# Patient Record
Sex: Male | Born: 1970
Health system: Southern US, Community
[De-identification: ages and names within clinical notes are randomized; demographics above are authoritative.]

## PROBLEM LIST (undated history)

## (undated) DIAGNOSIS — N186 End stage renal disease: Secondary | ICD-10-CM

## (undated) DIAGNOSIS — E119 Type 2 diabetes mellitus without complications: Secondary | ICD-10-CM

## (undated) DIAGNOSIS — E7800 Pure hypercholesterolemia, unspecified: Secondary | ICD-10-CM

## (undated) DIAGNOSIS — A4901 Methicillin susceptible Staphylococcus aureus infection, unspecified site: Secondary | ICD-10-CM

## (undated) DIAGNOSIS — E559 Vitamin D deficiency, unspecified: Secondary | ICD-10-CM

## (undated) DIAGNOSIS — D649 Anemia, unspecified: Secondary | ICD-10-CM

## (undated) DIAGNOSIS — E669 Obesity, unspecified: Secondary | ICD-10-CM

## (undated) DIAGNOSIS — R809 Proteinuria, unspecified: Secondary | ICD-10-CM

## (undated) DIAGNOSIS — J9690 Respiratory failure, unspecified, unspecified whether with hypoxia or hypercapnia: Secondary | ICD-10-CM

## (undated) DIAGNOSIS — Z87898 Personal history of other specified conditions: Secondary | ICD-10-CM

## (undated) DIAGNOSIS — A419 Sepsis, unspecified organism: Secondary | ICD-10-CM

## (undated) DIAGNOSIS — I890 Lymphedema, not elsewhere classified: Secondary | ICD-10-CM

## (undated) DIAGNOSIS — L97909 Non-pressure chronic ulcer of unspecified part of unspecified lower leg with unspecified severity: Secondary | ICD-10-CM

## (undated) DIAGNOSIS — Z794 Long term (current) use of insulin: Secondary | ICD-10-CM

## (undated) DIAGNOSIS — G4730 Sleep apnea, unspecified: Secondary | ICD-10-CM

## (undated) DIAGNOSIS — I739 Peripheral vascular disease, unspecified: Secondary | ICD-10-CM

## (undated) DIAGNOSIS — E875 Hyperkalemia: Secondary | ICD-10-CM

## (undated) DIAGNOSIS — N189 Chronic kidney disease, unspecified: Secondary | ICD-10-CM

## (undated) DIAGNOSIS — R0600 Dyspnea, unspecified: Secondary | ICD-10-CM

## (undated) DIAGNOSIS — E11319 Type 2 diabetes mellitus with unspecified diabetic retinopathy without macular edema: Secondary | ICD-10-CM

## (undated) DIAGNOSIS — I10 Essential (primary) hypertension: Secondary | ICD-10-CM

## (undated) DIAGNOSIS — N390 Urinary tract infection, site not specified: Secondary | ICD-10-CM

## (undated) DIAGNOSIS — I83009 Varicose veins of unspecified lower extremity with ulcer of unspecified site: Secondary | ICD-10-CM

## (undated) DIAGNOSIS — F419 Anxiety disorder, unspecified: Secondary | ICD-10-CM

## (undated) DIAGNOSIS — I499 Cardiac arrhythmia, unspecified: Secondary | ICD-10-CM

## (undated) DIAGNOSIS — G9341 Metabolic encephalopathy: Secondary | ICD-10-CM

## (undated) DIAGNOSIS — F329 Major depressive disorder, single episode, unspecified: Secondary | ICD-10-CM

## (undated) HISTORY — DX: Type 2 diabetes mellitus without complications: E11.9

## (undated) HISTORY — DX: Vitamin D deficiency, unspecified: E55.9

## (undated) HISTORY — DX: Proteinuria, unspecified: R80.9

## (undated) HISTORY — DX: Essential (primary) hypertension: I10

## (undated) SURGERY — Surgical Case
Anesthesia: *Unknown

---

## 2003-08-31 DIAGNOSIS — E1169 Type 2 diabetes mellitus with other specified complication: Secondary | ICD-10-CM | POA: Insufficient documentation

## 2004-06-09 ENCOUNTER — Ambulatory Visit: Payer: Self-pay | Admitting: Internal Medicine

## 2012-06-12 ENCOUNTER — Encounter (INDEPENDENT_AMBULATORY_CARE_PROVIDER_SITE_OTHER): Payer: BC Managed Care – PPO | Admitting: Ophthalmology

## 2012-06-12 DIAGNOSIS — I10 Essential (primary) hypertension: Secondary | ICD-10-CM

## 2012-06-12 DIAGNOSIS — H3581 Retinal edema: Secondary | ICD-10-CM

## 2012-06-12 DIAGNOSIS — E1165 Type 2 diabetes mellitus with hyperglycemia: Secondary | ICD-10-CM

## 2012-06-12 DIAGNOSIS — H43819 Vitreous degeneration, unspecified eye: Secondary | ICD-10-CM

## 2012-06-12 DIAGNOSIS — E1139 Type 2 diabetes mellitus with other diabetic ophthalmic complication: Secondary | ICD-10-CM

## 2012-06-12 DIAGNOSIS — H2510 Age-related nuclear cataract, unspecified eye: Secondary | ICD-10-CM

## 2012-06-12 DIAGNOSIS — D3130 Benign neoplasm of unspecified choroid: Secondary | ICD-10-CM

## 2012-06-12 DIAGNOSIS — E11319 Type 2 diabetes mellitus with unspecified diabetic retinopathy without macular edema: Secondary | ICD-10-CM

## 2012-06-12 DIAGNOSIS — H35039 Hypertensive retinopathy, unspecified eye: Secondary | ICD-10-CM

## 2012-06-26 ENCOUNTER — Ambulatory Visit (INDEPENDENT_AMBULATORY_CARE_PROVIDER_SITE_OTHER): Payer: BC Managed Care – PPO | Admitting: Ophthalmology

## 2012-06-26 DIAGNOSIS — E1165 Type 2 diabetes mellitus with hyperglycemia: Secondary | ICD-10-CM

## 2012-06-26 DIAGNOSIS — H3581 Retinal edema: Secondary | ICD-10-CM

## 2012-07-10 ENCOUNTER — Other Ambulatory Visit (INDEPENDENT_AMBULATORY_CARE_PROVIDER_SITE_OTHER): Payer: BC Managed Care – PPO | Admitting: Ophthalmology

## 2012-07-10 DIAGNOSIS — H3581 Retinal edema: Secondary | ICD-10-CM

## 2012-07-10 DIAGNOSIS — E1139 Type 2 diabetes mellitus with other diabetic ophthalmic complication: Secondary | ICD-10-CM

## 2012-08-07 ENCOUNTER — Encounter (INDEPENDENT_AMBULATORY_CARE_PROVIDER_SITE_OTHER): Payer: BC Managed Care – PPO | Admitting: Ophthalmology

## 2012-08-09 ENCOUNTER — Encounter (INDEPENDENT_AMBULATORY_CARE_PROVIDER_SITE_OTHER): Payer: BC Managed Care – PPO | Admitting: Ophthalmology

## 2012-09-04 ENCOUNTER — Encounter (INDEPENDENT_AMBULATORY_CARE_PROVIDER_SITE_OTHER): Payer: BC Managed Care – PPO | Admitting: Ophthalmology

## 2012-09-04 DIAGNOSIS — E1139 Type 2 diabetes mellitus with other diabetic ophthalmic complication: Secondary | ICD-10-CM

## 2012-09-04 DIAGNOSIS — E11359 Type 2 diabetes mellitus with proliferative diabetic retinopathy without macular edema: Secondary | ICD-10-CM

## 2013-01-08 ENCOUNTER — Ambulatory Visit (INDEPENDENT_AMBULATORY_CARE_PROVIDER_SITE_OTHER): Payer: BC Managed Care – PPO | Admitting: Ophthalmology

## 2013-01-08 DIAGNOSIS — E11359 Type 2 diabetes mellitus with proliferative diabetic retinopathy without macular edema: Secondary | ICD-10-CM

## 2013-01-08 DIAGNOSIS — I10 Essential (primary) hypertension: Secondary | ICD-10-CM

## 2013-01-08 DIAGNOSIS — H35039 Hypertensive retinopathy, unspecified eye: Secondary | ICD-10-CM

## 2013-01-08 DIAGNOSIS — H43819 Vitreous degeneration, unspecified eye: Secondary | ICD-10-CM

## 2013-01-08 DIAGNOSIS — E11319 Type 2 diabetes mellitus with unspecified diabetic retinopathy without macular edema: Secondary | ICD-10-CM

## 2013-01-08 DIAGNOSIS — E1139 Type 2 diabetes mellitus with other diabetic ophthalmic complication: Secondary | ICD-10-CM

## 2013-01-08 DIAGNOSIS — D3130 Benign neoplasm of unspecified choroid: Secondary | ICD-10-CM

## 2013-01-08 DIAGNOSIS — H2510 Age-related nuclear cataract, unspecified eye: Secondary | ICD-10-CM

## 2013-07-16 ENCOUNTER — Ambulatory Visit (INDEPENDENT_AMBULATORY_CARE_PROVIDER_SITE_OTHER): Payer: BC Managed Care – PPO | Admitting: Ophthalmology

## 2013-07-16 DIAGNOSIS — I10 Essential (primary) hypertension: Secondary | ICD-10-CM

## 2013-07-16 DIAGNOSIS — H35039 Hypertensive retinopathy, unspecified eye: Secondary | ICD-10-CM

## 2013-07-16 DIAGNOSIS — H43819 Vitreous degeneration, unspecified eye: Secondary | ICD-10-CM

## 2013-07-16 DIAGNOSIS — H2510 Age-related nuclear cataract, unspecified eye: Secondary | ICD-10-CM

## 2013-07-16 DIAGNOSIS — E11319 Type 2 diabetes mellitus with unspecified diabetic retinopathy without macular edema: Secondary | ICD-10-CM

## 2013-07-16 DIAGNOSIS — E11359 Type 2 diabetes mellitus with proliferative diabetic retinopathy without macular edema: Secondary | ICD-10-CM

## 2013-07-16 DIAGNOSIS — E11311 Type 2 diabetes mellitus with unspecified diabetic retinopathy with macular edema: Secondary | ICD-10-CM

## 2013-07-16 DIAGNOSIS — E1139 Type 2 diabetes mellitus with other diabetic ophthalmic complication: Secondary | ICD-10-CM

## 2013-07-25 ENCOUNTER — Encounter (INDEPENDENT_AMBULATORY_CARE_PROVIDER_SITE_OTHER): Payer: BC Managed Care – PPO | Admitting: Ophthalmology

## 2013-07-25 DIAGNOSIS — E11319 Type 2 diabetes mellitus with unspecified diabetic retinopathy without macular edema: Secondary | ICD-10-CM

## 2013-07-25 DIAGNOSIS — E11359 Type 2 diabetes mellitus with proliferative diabetic retinopathy without macular edema: Secondary | ICD-10-CM

## 2013-07-25 DIAGNOSIS — E11311 Type 2 diabetes mellitus with unspecified diabetic retinopathy with macular edema: Secondary | ICD-10-CM

## 2013-07-25 DIAGNOSIS — E1139 Type 2 diabetes mellitus with other diabetic ophthalmic complication: Secondary | ICD-10-CM

## 2013-08-20 ENCOUNTER — Encounter (INDEPENDENT_AMBULATORY_CARE_PROVIDER_SITE_OTHER): Payer: BC Managed Care – PPO | Admitting: Ophthalmology

## 2013-08-20 DIAGNOSIS — H43819 Vitreous degeneration, unspecified eye: Secondary | ICD-10-CM

## 2013-08-20 DIAGNOSIS — E11319 Type 2 diabetes mellitus with unspecified diabetic retinopathy without macular edema: Secondary | ICD-10-CM

## 2013-08-20 DIAGNOSIS — D3130 Benign neoplasm of unspecified choroid: Secondary | ICD-10-CM

## 2013-08-20 DIAGNOSIS — H2510 Age-related nuclear cataract, unspecified eye: Secondary | ICD-10-CM

## 2013-08-20 DIAGNOSIS — I10 Essential (primary) hypertension: Secondary | ICD-10-CM

## 2013-08-20 DIAGNOSIS — H35039 Hypertensive retinopathy, unspecified eye: Secondary | ICD-10-CM

## 2013-08-20 DIAGNOSIS — E11311 Type 2 diabetes mellitus with unspecified diabetic retinopathy with macular edema: Secondary | ICD-10-CM

## 2013-08-20 DIAGNOSIS — E1139 Type 2 diabetes mellitus with other diabetic ophthalmic complication: Secondary | ICD-10-CM

## 2013-09-03 ENCOUNTER — Encounter (INDEPENDENT_AMBULATORY_CARE_PROVIDER_SITE_OTHER): Payer: BC Managed Care – PPO | Admitting: Ophthalmology

## 2013-09-17 ENCOUNTER — Encounter (INDEPENDENT_AMBULATORY_CARE_PROVIDER_SITE_OTHER): Payer: 59 | Admitting: Ophthalmology

## 2013-09-17 DIAGNOSIS — D3130 Benign neoplasm of unspecified choroid: Secondary | ICD-10-CM

## 2013-09-17 DIAGNOSIS — E1139 Type 2 diabetes mellitus with other diabetic ophthalmic complication: Secondary | ICD-10-CM

## 2013-09-17 DIAGNOSIS — E11319 Type 2 diabetes mellitus with unspecified diabetic retinopathy without macular edema: Secondary | ICD-10-CM

## 2013-09-17 DIAGNOSIS — E11359 Type 2 diabetes mellitus with proliferative diabetic retinopathy without macular edema: Secondary | ICD-10-CM

## 2013-09-17 DIAGNOSIS — H2510 Age-related nuclear cataract, unspecified eye: Secondary | ICD-10-CM

## 2013-09-17 DIAGNOSIS — E11311 Type 2 diabetes mellitus with unspecified diabetic retinopathy with macular edema: Secondary | ICD-10-CM

## 2013-09-17 DIAGNOSIS — E1165 Type 2 diabetes mellitus with hyperglycemia: Secondary | ICD-10-CM

## 2013-09-17 DIAGNOSIS — I10 Essential (primary) hypertension: Secondary | ICD-10-CM

## 2013-09-17 DIAGNOSIS — H43819 Vitreous degeneration, unspecified eye: Secondary | ICD-10-CM

## 2013-09-17 DIAGNOSIS — H35039 Hypertensive retinopathy, unspecified eye: Secondary | ICD-10-CM

## 2013-10-15 ENCOUNTER — Encounter (INDEPENDENT_AMBULATORY_CARE_PROVIDER_SITE_OTHER): Payer: 59 | Admitting: Ophthalmology

## 2013-10-15 DIAGNOSIS — E11311 Type 2 diabetes mellitus with unspecified diabetic retinopathy with macular edema: Secondary | ICD-10-CM

## 2013-10-15 DIAGNOSIS — H35039 Hypertensive retinopathy, unspecified eye: Secondary | ICD-10-CM

## 2013-10-15 DIAGNOSIS — I10 Essential (primary) hypertension: Secondary | ICD-10-CM

## 2013-10-15 DIAGNOSIS — E1165 Type 2 diabetes mellitus with hyperglycemia: Secondary | ICD-10-CM

## 2013-10-15 DIAGNOSIS — E11359 Type 2 diabetes mellitus with proliferative diabetic retinopathy without macular edema: Secondary | ICD-10-CM

## 2013-10-15 DIAGNOSIS — E1139 Type 2 diabetes mellitus with other diabetic ophthalmic complication: Secondary | ICD-10-CM

## 2013-10-15 DIAGNOSIS — E11319 Type 2 diabetes mellitus with unspecified diabetic retinopathy without macular edema: Secondary | ICD-10-CM

## 2013-12-10 ENCOUNTER — Encounter (INDEPENDENT_AMBULATORY_CARE_PROVIDER_SITE_OTHER): Payer: 59 | Admitting: Ophthalmology

## 2013-12-13 ENCOUNTER — Encounter (INDEPENDENT_AMBULATORY_CARE_PROVIDER_SITE_OTHER): Payer: 59 | Admitting: Ophthalmology

## 2014-03-12 DIAGNOSIS — R809 Proteinuria, unspecified: Secondary | ICD-10-CM | POA: Insufficient documentation

## 2014-06-13 DIAGNOSIS — Z7901 Long term (current) use of anticoagulants: Secondary | ICD-10-CM | POA: Insufficient documentation

## 2014-06-13 DIAGNOSIS — Z794 Long term (current) use of insulin: Secondary | ICD-10-CM | POA: Insufficient documentation

## 2014-06-18 DIAGNOSIS — I10 Essential (primary) hypertension: Secondary | ICD-10-CM | POA: Insufficient documentation

## 2014-06-18 DIAGNOSIS — I739 Peripheral vascular disease, unspecified: Secondary | ICD-10-CM | POA: Insufficient documentation

## 2014-08-10 ENCOUNTER — Ambulatory Visit: Payer: Self-pay | Admitting: Physician Assistant

## 2016-10-14 ENCOUNTER — Encounter (INDEPENDENT_AMBULATORY_CARE_PROVIDER_SITE_OTHER): Payer: Self-pay

## 2017-08-30 DIAGNOSIS — N185 Chronic kidney disease, stage 5: Secondary | ICD-10-CM | POA: Insufficient documentation

## 2017-08-30 DIAGNOSIS — I120 Hypertensive chronic kidney disease with stage 5 chronic kidney disease or end stage renal disease: Secondary | ICD-10-CM | POA: Insufficient documentation

## 2017-09-15 ENCOUNTER — Telehealth (INDEPENDENT_AMBULATORY_CARE_PROVIDER_SITE_OTHER): Payer: Self-pay

## 2017-09-15 NOTE — Telephone Encounter (Signed)
Called the patient back and gave him the message.

## 2017-09-15 NOTE — Telephone Encounter (Signed)
Patient called and stated that his left leg is swelling form the knee down and it hurts to touch it. He states that the pain is in the calf area. He states that he has been trying to elevate his leg sometimes, but nothing is helping to block out the pain in his leg  He also states that left leg is much larger than the right leg. He was trying to hold off until his appointment with [place] 09/26/17, but he doesn't think that he can wait that long?

## 2017-09-15 NOTE — Telephone Encounter (Signed)
I would speak to the providers who will be in the office the next two days if it is ok to place him on their schedule.

## 2017-09-20 ENCOUNTER — Encounter (INDEPENDENT_AMBULATORY_CARE_PROVIDER_SITE_OTHER): Payer: Self-pay | Admitting: Vascular Surgery

## 2017-09-20 ENCOUNTER — Ambulatory Visit (INDEPENDENT_AMBULATORY_CARE_PROVIDER_SITE_OTHER): Payer: BLUE CROSS/BLUE SHIELD | Admitting: Vascular Surgery

## 2017-09-20 VITALS — BP 191/85 | HR 68 | Resp 19 | Ht 76.0 in | Wt 363.0 lb

## 2017-09-20 DIAGNOSIS — E119 Type 2 diabetes mellitus without complications: Secondary | ICD-10-CM

## 2017-09-20 DIAGNOSIS — E1165 Type 2 diabetes mellitus with hyperglycemia: Secondary | ICD-10-CM | POA: Insufficient documentation

## 2017-09-20 DIAGNOSIS — M7989 Other specified soft tissue disorders: Secondary | ICD-10-CM

## 2017-09-20 DIAGNOSIS — I10 Essential (primary) hypertension: Secondary | ICD-10-CM

## 2017-09-20 NOTE — Assessment & Plan Note (Signed)
I have had a long discussion with the patient regarding swelling and why it  causes symptoms.  Patient will begin with Unna boots to try to get the swelling under control as it is quite dramatic today and he has a large blister that is going to become an ulceration.  This will likely be required for 2-3 weeks.  After this, the patient will began wearing graduated compression stockings class 1 (20-30 mmHg) on a daily basis a prescription was given. The patient will  beginning wearing the stockings first thing in the morning and removing them in the evening. The patient is instructed specifically not to sleep in the stockings.   In addition, behavioral modification will be initiated.  This will include frequent elevation, use of over the counter pain medications and exercise such as walking.  I have reviewed systemic causes for chronic edema such as liver, kidney and cardiac etiologies.  The patient denies problems with these organ systems.    Consideration for a lymph pump will also be made based upon the effectiveness of conservative therapy.  This would help to improve the edema control and prevent sequela such as ulcers and infections   Patient should undergo duplex ultrasound of the venous system to ensure that DVT or reflux is not present.  The patient will follow-up with me after the ultrasound.

## 2017-09-20 NOTE — Patient Instructions (Signed)
Edema Edema is an abnormal buildup of fluids in your bodytissues. Edema is somewhatdependent on gravity to pull the fluid to the lowest place in your body. That makes the condition more common in the legs and thighs (lower extremities). Painless swelling of the feet and ankles is common and becomes more likely as you get older. It is also common in looser tissues, like around your eyes. When the affected area is squeezed, the fluid may move out of that spot and leave a dent for a few moments. This dent is called pitting. What are the causes? There are many possible causes of edema. Eating too much salt and being on your feet or sitting for a long time can cause edema in your legs and ankles. Hot weather may make edema worse. Common medical causes of edema include:  Heart failure.  Liver disease.  Kidney disease.  Weak blood vessels in your legs.  Cancer.  An injury.  Pregnancy.  Some medications.  Obesity.  What are the signs or symptoms? Edema is usually painless.Your skin may look swollen or shiny. How is this diagnosed? Your health care provider may be able to diagnose edema by asking about your medical history and doing a physical exam. You may need to have tests such as X-rays, an electrocardiogram, or blood tests to check for medical conditions that may cause edema. How is this treated? Edema treatment depends on the cause. If you have heart, liver, or kidney disease, you need the treatment appropriate for these conditions. General treatment may include:  Elevation of the affected body part above the level of your heart.  Compression of the affected body part. Pressure from elastic bandages or support stockings squeezes the tissues and forces fluid back into the blood vessels. This keeps fluid from entering the tissues.  Restriction of fluid and salt intake.  Use of a water pill (diuretic). These medications are appropriate only for some types of edema. They pull fluid  out of your body and make you urinate more often. This gets rid of fluid and reduces swelling, but diuretics can have side effects. Only use diuretics as directed by your health care provider.  Follow these instructions at home:  Keep the affected body part above the level of your heart when you are lying down.  Do not sit still or stand for prolonged periods.  Do not put anything directly under your knees when lying down.  Do not wear constricting clothing or garters on your upper legs.  Exercise your legs to work the fluid back into your blood vessels. This may help the swelling go down.  Wear elastic bandages or support stockings to reduce ankle swelling as directed by your health care provider.  Eat a low-salt diet to reduce fluid if your health care provider recommends it.  Only take medicines as directed by your health care provider. Contact a health care provider if:  Your edema is not responding to treatment.  You have heart, liver, or kidney disease and notice symptoms of edema.  You have edema in your legs that does not improve after elevating them.  You have sudden and unexplained weight gain. Get help right away if:  You develop shortness of breath or chest pain.  You cannot breathe when you lie down.  You develop pain, redness, or warmth in the swollen areas.  You have heart, liver, or kidney disease and suddenly get edema.  You have a fever and your symptoms suddenly get worse. This information is   not intended to replace advice given to you by your health care provider. Make sure you discuss any questions you have with your health care provider. Document Released: 08/16/2005 Document Revised: 01/22/2016 Document Reviewed: 06/08/2013 Elsevier Interactive Patient Education  2017 [employer]  

## 2017-09-20 NOTE — Assessment & Plan Note (Signed)
blood pressure control important in reducing the progression of atherosclerotic disease. On appropriate oral medications.  

## 2017-09-20 NOTE — Assessment & Plan Note (Signed)
blood glucose control important in reducing the progression of atherosclerotic disease. Also, involved in wound healing. On appropriate medications.  

## 2017-09-20 NOTE — Progress Notes (Signed)
Patient ID: [name], male   DOB: [birth year], 47 y.o.   MRN: [id number]  Chief Complaint  Patient presents with  . New Patient (Initial Visit)    LE edema, left    HPI [name] is a 47 y.o. male.  I am asked to see the patient by S. Gauger for evaluation of left leg swelling.  The patient reports years of progressive swelling in his left leg.  This has been an ongoing problem but has exacerbated and gotten much worse over the past several months.  He has been out of work since last week as the left leg swelling is gotten dramatic.  He has a large blister on the posterior calf that started within the past couple of weeks as well.  No fevers or chills.  No antecedent history of DVT or superficial thrombophlebitis to his knowledge.  No previous history of trauma or surgery to that leg.  No significant right lower extremity symptoms.  There is no clear inciting event or causative factor that started the symptoms.  He tried to get a compression stocking a few weeks ago, but his leg was too swollen and was told by the woman at the store he was off the charts.   Past Medical History:  Diagnosis Date  . Diabetes mellitus without complication ([place])   . Hypertension   . Microalbuminuria   . Vitamin D deficiency    PSH: None  Family History  Problem Relation Age of Onset  . Diabetes Father   . Kidney disease Father   . Diabetes Daughter   No bleeding or clotting disorders  Social History Social History   Tobacco Use  . Smoking status: Never Smoker  . Smokeless tobacco: Never Used  Substance Use Topics  . Alcohol use: Yes    Comment: beer  . Drug use: Not on file     Allergies  Allergen Reactions  . Lisinopril Swelling    Current Outpatient Medications  Medication Sig Dispense Refill  . amLODipine (NORVASC) 10 MG tablet Take by mouth.    [name] atenolol (TENORMIN) 25 MG tablet Take 25 mg by mouth daily.  1  . empagliflozin (JARDIANCE) 25 MG TABS tablet Take by  mouth.    [name] gentamicin cream (GARAMYCIN) 0.1 % Apply 1 application topically 3 (three) times daily.    . hydrochlorothiazide (HYDRODIURIL) 25 MG tablet Take by mouth.    . Insulin Glargine (LANTUS SOLOSTAR) 100 UNIT/ML Solostar Pen Inject into the skin.    . metFORMIN (GLUCOPHAGE-XR) 500 MG 24 hr tablet TAKE 2 TABLETS (1,000 MG TOTAL) BY MOUTH ONCE DAILY.(NEED APPT FOR FURTHER REFILLS)  0   No current facility-administered medications for this visit.       REVIEW OF SYSTEMS (Negative unless checked)  Constitutional: [] Weight loss  [] Fever  [] Chills Cardiac: [] Chest pain   [] Chest pressure   [] Palpitations   [] Shortness of breath when laying flat   [] Shortness of breath at rest   [] Shortness of breath with exertion. Vascular:  [] Pain in legs with walking   [] Pain in legs at rest   [] Pain in legs when laying flat   [] Claudication   [] Pain in feet when walking  [] Pain in feet at rest  [] Pain in feet when laying flat   [] History of DVT   [] Phlebitis   [x] Swelling in legs   [] Varicose veins   [] Non-healing ulcers Pulmonary:   [] Uses home oxygen   [] Productive cough   [] Hemoptysis   [] Wheeze  []   COPD   [] Asthma Neurologic:  [] Dizziness  [] Blackouts   [] Seizures   [] History of stroke   [] History of TIA  [] Aphasia   [] Temporary blindness   [] Dysphagia   [] Weakness or numbness in arms   [] Weakness or numbness in legs Musculoskeletal:  [x] Arthritis   [] Joint swelling   [] Joint pain   [] Low back pain Hematologic:  [] Easy bruising  [] Easy bleeding   [] Hypercoagulable state   [] Anemic  [] Hepatitis Gastrointestinal:  [] Blood in stool   [] Vomiting blood  [] Gastroesophageal reflux/heartburn   [] Abdominal pain Genitourinary:  [] Chronic kidney disease   [] Difficult urination  [] Frequent urination  [] Burning with urination   [] Hematuria Skin:  [] Rashes   [] Ulcers   [] Wounds Psychological:  [] History of anxiety   []  History of major depression.    Physical Exam BP (!) 191/85 (BP Location: Right Arm, Patient  Position: Sitting)   Pulse 68   Resp 19   Ht 6\' 4"  (1.93 m)   Wt (!) 164.7 kg (363 lb)   BMI 44.19 kg/m  Gen:  WD/WN, NAD Head: [place]/AT, No temporalis wasting.  Ear/Nose/Throat: Hearing grossly intact, nares w/o erythema or drainage, oropharynx w/o Erythema/Exudate Eyes: Conjunctiva clear, sclera non-icteric  Neck: trachea midline.  No JVD.  Pulmonary:  Good air movement, respirations not labored, no use of accessory muscles Cardiac: RRR, no JVD Vascular:  Vessel Right Left  Radial Palpable Palpable                          PT  1+ palpable  not palpable  DP  2+ palpable  not palpable   Gastrointestinal: soft, non-tender/non-distended. Musculoskeletal: M/S 5/5 throughout.  Extremities without ischemic changes.  No deformity or atrophy.  Trace right lower extremity edema, 3-4+ left lower extremity edema.  Stasis dermatitis and scaling changes to the skin of the left leg.  Large blister and impending ulceration on the left posterior calf measuring about 2-3 cm Neurologic: Sensation grossly intact in extremities.  Symmetrical.  Speech is fluent. Motor exam as listed above. Psychiatric: Judgment intact, Mood & affect appropriate for pt's clinical situation. Dermatologic: No open ulceration, but blister as above   Radiology No results found.  Labs No results found for this or any previous visit (from the past 2160 hour(s)).  Assessment/Plan:  Hypertension blood pressure control important in reducing the progression of atherosclerotic disease. On appropriate oral medications.   Diabetes mellitus without complication (HCC) blood glucose control important in reducing the progression of atherosclerotic disease. Also, involved in wound healing. On appropriate medications.   Swelling of limb I have had a long discussion with the patient regarding swelling and why it  causes symptoms.  Patient will begin with Unna boots to try to get the swelling under control as it is quite  dramatic today and he has a large blister that is going to become an ulceration.  This will likely be required for 2-3 weeks.  After this, the patient will began wearing graduated compression stockings class 1 (20-30 mmHg) on a daily basis a prescription was given. The patient will  beginning wearing the stockings first thing in the morning and removing them in the evening. The patient is instructed specifically not to sleep in the stockings.   In addition, behavioral modification will be initiated.  This will include frequent elevation, use of over the counter pain medications and exercise such as walking.  I have reviewed systemic causes for chronic edema such as liver, kidney and  cardiac etiologies.  The patient denies problems with these organ systems.    Consideration for a lymph pump will also be made based upon the effectiveness of conservative therapy.  This would help to improve the edema control and prevent sequela such as ulcers and infections   Patient should undergo duplex ultrasound of the venous system to ensure that DVT or reflux is not present.  The patient will follow-up with me after the ultrasound.        Leotis Pain 09/20/2017, 10:28 AM   This note was created with Dragon medical transcription system.  Any errors from dictation are unintentional.

## 2017-09-27 ENCOUNTER — Ambulatory Visit (INDEPENDENT_AMBULATORY_CARE_PROVIDER_SITE_OTHER): Payer: BLUE CROSS/BLUE SHIELD | Admitting: Vascular Surgery

## 2017-09-27 ENCOUNTER — Encounter (INDEPENDENT_AMBULATORY_CARE_PROVIDER_SITE_OTHER): Payer: Self-pay

## 2017-09-27 VITALS — BP 141/84 | HR 75 | Resp 18 | Ht 76.0 in | Wt 351.0 lb

## 2017-09-27 DIAGNOSIS — M7989 Other specified soft tissue disorders: Secondary | ICD-10-CM | POA: Diagnosis not present

## 2017-09-27 NOTE — Progress Notes (Signed)
History of Present Illness  There is no documented history at this time  Assessments & Plan   There are no diagnoses linked to this encounter.    Additional instructions  Subjective:  Patient presents with venous ulcer of the Left lower extremity.    Procedure:  3 layer unna wrap was placed Left lower extremity.   Plan:   Follow up in one week.  

## 2017-10-01 ENCOUNTER — Emergency Department: Payer: BLUE CROSS/BLUE SHIELD

## 2017-10-01 ENCOUNTER — Encounter: Payer: Self-pay | Admitting: Emergency Medicine

## 2017-10-01 ENCOUNTER — Inpatient Hospital Stay
Admission: EM | Admit: 2017-10-01 | Discharge: 2017-10-19 | DRG: 570 | Disposition: A | Discharge status: Skilled Nursing Facility | Payer: BLUE CROSS/BLUE SHIELD | Attending: Internal Medicine | Admitting: Internal Medicine

## 2017-10-01 ENCOUNTER — Inpatient Hospital Stay: Payer: BLUE CROSS/BLUE SHIELD

## 2017-10-01 ENCOUNTER — Other Ambulatory Visit: Payer: Self-pay

## 2017-10-01 DIAGNOSIS — N183 Chronic kidney disease, stage 3 unspecified: Secondary | ICD-10-CM

## 2017-10-01 DIAGNOSIS — L02416 Cutaneous abscess of left lower limb: Secondary | ICD-10-CM | POA: Diagnosis present

## 2017-10-01 DIAGNOSIS — L02415 Cutaneous abscess of right lower limb: Secondary | ICD-10-CM | POA: Diagnosis present

## 2017-10-01 DIAGNOSIS — Z841 Family history of disorders of kidney and ureter: Secondary | ICD-10-CM | POA: Diagnosis not present

## 2017-10-01 DIAGNOSIS — E1151 Type 2 diabetes mellitus with diabetic peripheral angiopathy without gangrene: Secondary | ICD-10-CM | POA: Diagnosis present

## 2017-10-01 DIAGNOSIS — J9811 Atelectasis: Secondary | ICD-10-CM | POA: Diagnosis not present

## 2017-10-01 DIAGNOSIS — L97929 Non-pressure chronic ulcer of unspecified part of left lower leg with unspecified severity: Secondary | ICD-10-CM | POA: Diagnosis present

## 2017-10-01 DIAGNOSIS — E11649 Type 2 diabetes mellitus with hypoglycemia without coma: Secondary | ICD-10-CM | POA: Diagnosis not present

## 2017-10-01 DIAGNOSIS — E1122 Type 2 diabetes mellitus with diabetic chronic kidney disease: Secondary | ICD-10-CM | POA: Diagnosis present

## 2017-10-01 DIAGNOSIS — R0902 Hypoxemia: Secondary | ICD-10-CM

## 2017-10-01 DIAGNOSIS — E11319 Type 2 diabetes mellitus with unspecified diabetic retinopathy without macular edema: Secondary | ICD-10-CM | POA: Diagnosis present

## 2017-10-01 DIAGNOSIS — Z794 Long term (current) use of insulin: Secondary | ICD-10-CM

## 2017-10-01 DIAGNOSIS — B9561 Methicillin susceptible Staphylococcus aureus infection as the cause of diseases classified elsewhere: Secondary | ICD-10-CM | POA: Diagnosis present

## 2017-10-01 DIAGNOSIS — I129 Hypertensive chronic kidney disease with stage 1 through stage 4 chronic kidney disease, or unspecified chronic kidney disease: Secondary | ICD-10-CM | POA: Diagnosis present

## 2017-10-01 DIAGNOSIS — L02413 Cutaneous abscess of right upper limb: Secondary | ICD-10-CM | POA: Diagnosis not present

## 2017-10-01 DIAGNOSIS — I872 Venous insufficiency (chronic) (peripheral): Secondary | ICD-10-CM

## 2017-10-01 DIAGNOSIS — D638 Anemia in other chronic diseases classified elsewhere: Secondary | ICD-10-CM | POA: Diagnosis present

## 2017-10-01 DIAGNOSIS — L0291 Cutaneous abscess, unspecified: Secondary | ICD-10-CM | POA: Diagnosis present

## 2017-10-01 DIAGNOSIS — E1121 Type 2 diabetes mellitus with diabetic nephropathy: Secondary | ICD-10-CM | POA: Diagnosis not present

## 2017-10-01 DIAGNOSIS — N179 Acute kidney failure, unspecified: Secondary | ICD-10-CM | POA: Diagnosis present

## 2017-10-01 DIAGNOSIS — Z79899 Other long term (current) drug therapy: Secondary | ICD-10-CM

## 2017-10-01 DIAGNOSIS — E1140 Type 2 diabetes mellitus with diabetic neuropathy, unspecified: Secondary | ICD-10-CM | POA: Diagnosis present

## 2017-10-01 DIAGNOSIS — A419 Sepsis, unspecified organism: Secondary | ICD-10-CM

## 2017-10-01 DIAGNOSIS — Z833 Family history of diabetes mellitus: Secondary | ICD-10-CM

## 2017-10-01 DIAGNOSIS — R739 Hyperglycemia, unspecified: Secondary | ICD-10-CM

## 2017-10-01 DIAGNOSIS — E11621 Type 2 diabetes mellitus with foot ulcer: Secondary | ICD-10-CM

## 2017-10-01 DIAGNOSIS — E1165 Type 2 diabetes mellitus with hyperglycemia: Secondary | ICD-10-CM | POA: Diagnosis present

## 2017-10-01 DIAGNOSIS — E119 Type 2 diabetes mellitus without complications: Secondary | ICD-10-CM

## 2017-10-01 DIAGNOSIS — Z6841 Body Mass Index (BMI) 40.0 and over, adult: Secondary | ICD-10-CM | POA: Diagnosis not present

## 2017-10-01 DIAGNOSIS — L03116 Cellulitis of left lower limb: Secondary | ICD-10-CM | POA: Diagnosis not present

## 2017-10-01 DIAGNOSIS — Z888 Allergy status to other drugs, medicaments and biological substances status: Secondary | ICD-10-CM

## 2017-10-01 DIAGNOSIS — I10 Essential (primary) hypertension: Secondary | ICD-10-CM | POA: Insufficient documentation

## 2017-10-01 DIAGNOSIS — J9601 Acute respiratory failure with hypoxia: Secondary | ICD-10-CM | POA: Diagnosis not present

## 2017-10-01 DIAGNOSIS — I739 Peripheral vascular disease, unspecified: Secondary | ICD-10-CM | POA: Insufficient documentation

## 2017-10-01 DIAGNOSIS — M7989 Other specified soft tissue disorders: Secondary | ICD-10-CM | POA: Diagnosis present

## 2017-10-01 DIAGNOSIS — E559 Vitamin D deficiency, unspecified: Secondary | ICD-10-CM | POA: Diagnosis present

## 2017-10-01 DIAGNOSIS — G4733 Obstructive sleep apnea (adult) (pediatric): Secondary | ICD-10-CM | POA: Diagnosis present

## 2017-10-01 DIAGNOSIS — L0390 Cellulitis, unspecified: Secondary | ICD-10-CM

## 2017-10-01 LAB — GLUCOSE, CAPILLARY
GLUCOSE-CAPILLARY: 446 mg/dL — AB (ref 65–99)
GLUCOSE-CAPILLARY: 472 mg/dL — AB (ref 65–99)
GLUCOSE-CAPILLARY: 419 mg/dL — AB (ref 65–99)
GLUCOSE-CAPILLARY: 467 mg/dL — AB (ref 65–99)
GLUCOSE-CAPILLARY: 399 mg/dL — AB (ref 65–99)
Glucose-Capillary: 395 mg/dL — ABNORMAL HIGH (ref 65–99)

## 2017-10-01 LAB — CREATININE, SERUM
CREATININE: 2.22 mg/dL — AB (ref 0.61–1.24)
GFR calc Af Amer: 39 mL/min — ABNORMAL LOW (ref >60)
GFR calc non Af Amer: 34 mL/min — ABNORMAL LOW (ref >60)

## 2017-10-01 LAB — CBC
HEMATOCRIT: 38.1 % — AB (ref 40.0–52.0)
HEMATOCRIT: 35.3 % — AB (ref 40.0–52.0)
HEMOGLOBIN: 12 g/dL — AB (ref 13.0–18.0)
Hemoglobin: 11.3 g/dL — ABNORMAL LOW (ref 13.0–18.0)
MCH: 28 pg (ref 26.0–34.0)
MCH: 28.2 pg (ref 26.0–34.0)
MCHC: 31.5 g/dL — AB (ref 32.0–36.0)
MCHC: 32 g/dL (ref 32.0–36.0)
MCV: 89 fL (ref 80.0–100.0)
MCV: 88.2 fL (ref 80.0–100.0)
PLATELETS: 312 10*3/uL (ref 150–440)
Platelets: 365 10*3/uL (ref 150–440)
RBC: 4.28 MIL/uL — ABNORMAL LOW (ref 4.40–5.90)
RBC: 4 MIL/uL — AB (ref 4.40–5.90)
RDW: 14.3 % (ref 11.5–14.5)
RDW: 14.4 % (ref 11.5–14.5)
WBC: 17.1 10*3/uL — ABNORMAL HIGH (ref 3.8–10.6)
WBC: 17 10*3/uL — AB (ref 3.8–10.6)

## 2017-10-01 LAB — BASIC METABOLIC PANEL
ANION GAP: 9 (ref 5–15)
BUN: 56 mg/dL — AB (ref 6–20)
CHLORIDE: 97 mmol/L — AB (ref 101–111)
CO2: 25 mmol/L (ref 22–32)
Calcium: 8.8 mg/dL — ABNORMAL LOW (ref 8.9–10.3)
Creatinine, Ser: 2.13 mg/dL — ABNORMAL HIGH (ref 0.61–1.24)
GFR calc Af Amer: 41 mL/min — ABNORMAL LOW (ref >60)
GFR, EST NON AFRICAN AMERICAN: 35 mL/min — AB (ref >60)
GLUCOSE: 477 mg/dL — AB (ref 65–99)
Potassium: 4.1 mmol/L (ref 3.5–5.1)
Sodium: 131 mmol/L — ABNORMAL LOW (ref 135–145)

## 2017-10-01 LAB — HEMOGLOBIN A1C
Hgb A1c MFr Bld: 15.1 % — ABNORMAL HIGH (ref 4.8–5.6)
Mean Plasma Glucose: 386.67 mg/dL

## 2017-10-01 LAB — LACTIC ACID, PLASMA
LACTIC ACID, VENOUS: 2.1 mmol/L — AB (ref 0.5–1.9)
Lactic Acid, Venous: 1.8 mmol/L (ref 0.5–1.9)

## 2017-10-01 MED ORDER — CANAGLIFLOZIN 100 MG PO TABS
100.0000 mg | ORAL_TABLET | Freq: Every day | ORAL | Status: DC
  Filled 2017-10-01: qty 1

## 2017-10-01 MED ORDER — SODIUM CHLORIDE 0.9 % IV SOLN
INTRAVENOUS | Status: DC
  Administered 2017-10-01 – 2017-10-02 (×2): via INTRAVENOUS

## 2017-10-01 MED ORDER — INSULIN ASPART 100 UNIT/ML ~~LOC~~ SOLN: 0.0000 [IU] | Freq: Three times a day (TID) | SUBCUTANEOUS | Status: DC

## 2017-10-01 MED ORDER — ACETAMINOPHEN 325 MG PO TABS
650.0000 mg | ORAL_TABLET | Freq: Four times a day (QID) | ORAL | Status: DC | PRN
  Administered 2017-10-01 – 2017-10-05 (×3): 650 mg via ORAL
  Filled 2017-10-01 (×3): qty 2

## 2017-10-01 MED ORDER — AMLODIPINE BESYLATE 10 MG PO TABS
10.0000 mg | ORAL_TABLET | Freq: Every day | ORAL | Status: DC
  Administered 2017-10-01 – 2017-10-16 (×14): 10 mg via ORAL
  Filled 2017-10-01 (×8): qty 1
  Filled 2017-10-01: qty 2
  Filled 2017-10-01 (×5): qty 1

## 2017-10-01 MED ORDER — PIPERACILLIN-TAZOBACTAM 3.375 G IVPB 30 MIN
3.3750 g | Freq: Once | INTRAVENOUS | Status: AC
Start: 2017-10-01 — End: 2017-10-01
  Administered 2017-10-01: 3.375 g via INTRAVENOUS
  Filled 2017-10-01: qty 50

## 2017-10-01 MED ORDER — VANCOMYCIN HCL 10 G IV SOLR
1500.0000 mg | Freq: Two times a day (BID) | INTRAVENOUS | Status: DC
  Administered 2017-10-01 – 2017-10-03 (×4): 1500 mg via INTRAVENOUS
  Filled 2017-10-01 (×5): qty 1500

## 2017-10-01 MED ORDER — INSULIN GLARGINE 100 UNIT/ML ~~LOC~~ SOLN
20.0000 [IU] | Freq: Every day | SUBCUTANEOUS | Status: DC
  Administered 2017-10-01: 20 [IU] via SUBCUTANEOUS
  Filled 2017-10-01: qty 0.2

## 2017-10-01 MED ORDER — MORPHINE SULFATE (PF) 2 MG/ML IV SOLN
2.0000 mg | INTRAVENOUS | Status: DC | PRN
  Administered 2017-10-02 – 2017-10-17 (×8): 2 mg via INTRAVENOUS
  Filled 2017-10-01 (×8): qty 1

## 2017-10-01 MED ORDER — HYDROCHLOROTHIAZIDE 25 MG PO TABS: 25.0000 mg | ORAL_TABLET | Freq: Every day | ORAL | Status: DC

## 2017-10-01 MED ORDER — ONDANSETRON HCL 4 MG/2ML IJ SOLN: 4.0000 mg | Freq: Four times a day (QID) | INTRAMUSCULAR | Status: DC | PRN

## 2017-10-01 MED ORDER — DOCUSATE SODIUM 100 MG PO CAPS
100.0000 mg | ORAL_CAPSULE | Freq: Two times a day (BID) | ORAL | Status: DC
  Administered 2017-10-01 – 2017-10-19 (×23): 100 mg via ORAL
  Filled 2017-10-01 (×32): qty 1

## 2017-10-01 MED ORDER — INSULIN ASPART 100 UNIT/ML ~~LOC~~ SOLN
10.0000 [IU] | Freq: Once | SUBCUTANEOUS | Status: AC
  Administered 2017-10-01: 10 [IU] via INTRAVENOUS
  Filled 2017-10-01: qty 1

## 2017-10-01 MED ORDER — PANTOPRAZOLE SODIUM 40 MG IV SOLR
40.0000 mg | Freq: Two times a day (BID) | INTRAVENOUS | Status: DC
  Administered 2017-10-01 – 2017-10-02 (×3): 40 mg via INTRAVENOUS
  Filled 2017-10-01 (×3): qty 40

## 2017-10-01 MED ORDER — PIPERACILLIN-TAZOBACTAM 3.375 G IVPB
3.3750 g | Freq: Three times a day (TID) | INTRAVENOUS | Status: DC
  Administered 2017-10-01 – 2017-10-13 (×34): 3.375 g via INTRAVENOUS
  Filled 2017-10-01 (×35): qty 50

## 2017-10-01 MED ORDER — INSULIN ASPART 100 UNIT/ML ~~LOC~~ SOLN
15.0000 [IU] | Freq: Once | SUBCUTANEOUS | Status: AC
  Administered 2017-10-01: 15 [IU] via SUBCUTANEOUS
  Filled 2017-10-01: qty 1

## 2017-10-01 MED ORDER — VANCOMYCIN HCL IN DEXTROSE 1-5 GM/200ML-% IV SOLN
1000.0000 mg | Freq: Once | INTRAVENOUS | Status: AC
  Administered 2017-10-01: 1000 mg via INTRAVENOUS
  Filled 2017-10-01: qty 200

## 2017-10-01 MED ORDER — BISACODYL 10 MG RE SUPP
10.0000 mg | Freq: Every day | RECTAL | Status: DC | PRN
  Administered 2017-10-05: 10 mg via RECTAL
  Filled 2017-10-01 (×2): qty 1

## 2017-10-01 MED ORDER — ACETAMINOPHEN 650 MG RE SUPP: 650.0000 mg | Freq: Four times a day (QID) | RECTAL | Status: DC | PRN

## 2017-10-01 MED ORDER — METFORMIN HCL ER 500 MG PO TB24
500.0000 mg | ORAL_TABLET | Freq: Two times a day (BID) | ORAL | Status: DC
  Filled 2017-10-01: qty 1

## 2017-10-01 MED ORDER — ATENOLOL 25 MG PO TABS
25.0000 mg | ORAL_TABLET | Freq: Every day | ORAL | Status: DC
  Administered 2017-10-01 – 2017-10-16 (×14): 25 mg via ORAL
  Filled 2017-10-01 (×15): qty 1

## 2017-10-01 MED ORDER — PNEUMOCOCCAL VAC POLYVALENT 25 MCG/0.5ML IJ INJ: 0.5000 mL | INJECTION | INTRAMUSCULAR | Status: DC

## 2017-10-01 MED ORDER — ENOXAPARIN SODIUM 40 MG/0.4ML ~~LOC~~ SOLN
40.0000 mg | SUBCUTANEOUS | Status: DC
  Administered 2017-10-01: 40 mg via SUBCUTANEOUS
  Filled 2017-10-01: qty 0.4

## 2017-10-01 MED ORDER — ONDANSETRON HCL 4 MG PO TABS: 4.0000 mg | ORAL_TABLET | Freq: Four times a day (QID) | ORAL | Status: DC | PRN

## 2017-10-01 MED ORDER — INFLUENZA VAC SPLIT QUAD 0.5 ML IM SUSY: 0.5000 mL | PREFILLED_SYRINGE | INTRAMUSCULAR | Status: DC

## 2017-10-01 NOTE — Consult Note (Signed)
[facility]  CONSULT NOTE    Date: 10/01/2017                  Patient Name:  [name]  MRN: [id number]  DOB: Jan 13, 1971  Age / Sex: 47 y.o., male         PCP: Dayton Martes Victoriano Lain, NP                 Service Requesting Consult: Dr. Delana Meyer                 Reason for Consult: Acute Renal Failure            History of Present Illness: [name] is a 47 y.o. black male with diabetes mellitus type II, diabetic retinopathy, diabetic neuropathy, hypertension, obesiy, venous insufficiency, angioedema with ACE-I who was admitted to [facility] on 10/01/2017 for Abscess [L02.91] Hyperglycemia [R73.9] Sepsis, due to unspecified organism [facility]) [A41.9] Acute renal failure, unspecified acute renal failure type ([place]) [N17.9] Swelling of lower leg [M79.89]   Mother at bedside. Patient was recently seen at Vascular clinic where an Dixon was placed. Unfortunately patient started to have pain and swelling and had to take his UNNA boot off. With subjective chills.    Medications: Outpatient medications: Medications Prior to Admission  Medication Sig Dispense Refill Last Dose  . amLODipine (NORVASC) 10 MG tablet Take 10 mg by mouth daily.    09/29/2017 at Unknown time  . atenolol (TENORMIN) 25 MG tablet Take 25 mg by mouth daily.  1 09/29/2017 at Unknown time  . empagliflozin (JARDIANCE) 25 MG TABS tablet Take 25 mg by mouth daily.    09/29/2017 at Unknown time  . hydrochlorothiazide (HYDRODIURIL) 25 MG tablet Take 25 mg by mouth daily.    09/29/2017 at Unknown time  . metFORMIN (GLUCOPHAGE-XR) 500 MG 24 hr tablet TAKE 2 TABLETS (1,000 MG TOTAL) BY MOUTH ONCE DAILY.(NEED APPT FOR FURTHER REFILLS)  0 10/01/2017 at Unknown time  . gentamicin cream (GARAMYCIN) 0.1 % Apply 1 application topically 3 (three) times daily.   PRN at PRN  . Insulin Glargine (LANTUS SOLOSTAR) 100 UNIT/ML Solostar Pen Inject 0-20 Units into the skin as needed.    09/29/2017    Current  medications: Current Facility-Administered Medications  Medication Dose Route Frequency Provider Last Rate Last Dose  . 0.9 %  sodium chloride infusion   Intravenous Continuous [name], MD 100 mL/hr at 10/01/17 1328    . acetaminophen (TYLENOL) tablet 650 mg  650 mg Oral Q6H PRN [name], MD       Or  . acetaminophen (TYLENOL) suppository 650 mg  650 mg Rectal Q6H PRN [name], MD      . amLODipine (NORVASC) tablet 10 mg  10 mg Oral Daily [name], MD   10 mg at 10/01/17 1551  . atenolol (TENORMIN) tablet 25 mg  25 mg Oral Daily [name], MD   25 mg at 10/01/17 1551  . bisacodyl (DULCOLAX) suppository 10 mg  10 mg Rectal Daily PRN [name], MD      . Derrill Memo ON 10/02/2017] canagliflozin [facility]) tablet 100 mg  100 mg Oral QAC breakfast [name], MD      . docusate sodium (COLACE) capsule 100 mg  100 mg Oral BID [name], MD   100 mg at 10/01/17 1551  . enoxaparin (LOVENOX) injection 40 mg  40 mg Subcutaneous Q24H Fulton Reek  D, MD      . hydrochlorothiazide (HYDRODIURIL) tablet 25 mg  25 mg Oral Daily [name], MD      . Derrill Memo ON 10/02/2017] Influenza vac split quadrivalent PF (FLUARIX) injection 0.5 mL  0.5 mL Intramuscular Tomorrow-1000 [name], MD      . insulin aspart (novoLOG) injection 0-9 Units  0-9 Units Subcutaneous TID WC [name], MD      . insulin glargine (LANTUS) injection 20 Units  20 Units Subcutaneous QHS [name], MD      . metFORMIN (GLUCOPHAGE-XR) 24 hr tablet 500 mg  500 mg Oral BID WC [name], MD      . morphine 2 MG/ML injection 2 mg  2 mg Intravenous Q2H PRN [name], MD      . ondansetron (ZOFRAN) tablet 4 mg  4 mg Oral Q6H PRN [name], MD       Or  . ondansetron (ZOFRAN) injection 4 mg  4 mg Intravenous Q6H PRN [name], MD      . pantoprazole (PROTONIX) injection 40 mg  40 mg Intravenous Q12H [name], MD       . piperacillin-tazobactam (ZOSYN) IVPB 3.375 g  3.375 g Intravenous Q8H Coffee, Donna Christen, [facility]      . [START ON 10/02/2017] pneumococcal 23 valent vaccine (PNU-IMMUNE) injection 0.5 mL  0.5 mL Intramuscular Tomorrow-1000 [name], MD      . vancomycin (VANCOCIN) 1,500 mg in sodium chloride 0.9 % 500 mL IVPB  1,500 mg Intravenous Q12H Coffee, Donna Christen, RPH 250 mL/hr at 10/01/17 1552 1,500 mg at 10/01/17 1552      Allergies: Allergies  Allergen Reactions  . Lisinopril Swelling      Past Medical History: Past Medical History:  Diagnosis Date  . Diabetes mellitus without complication ([place])   . Hypertension   . Microalbuminuria   . Vitamin D deficiency      Past Surgical History: History reviewed. No pertinent surgical history.   Family History: Family History  Problem Relation Age of Onset  . Diabetes Father   . Kidney disease Father   . Diabetes Daughter      Social History: Social History   Socioeconomic History  . Marital status: Married    Spouse name: Not on file  . Number of children: Not on file  . Years of education: Not on file  . Highest education level: Not on file  Social Needs  . Financial resource strain: Not on file  . Food insecurity - worry: Not on file  . Food insecurity - inability: Not on file  . Transportation needs - medical: Not on file  . Transportation needs - non-medical: Not on file  Occupational History  . Not on file  Tobacco Use  . Smoking status: Never Smoker  . Smokeless tobacco: Never Used  Substance and Sexual Activity  . Alcohol use: Yes    Comment: beer  . Drug use: Not on file  . Sexual activity: Not on file  Other Topics Concern  . Not on file  Social History Narrative  . Not on file     Review of Systems: Review of Systems  Constitutional: Positive for chills, diaphoresis, fever and malaise/fatigue. Negative for weight loss.  HENT: Negative.  Negative for congestion, ear discharge, ear pain, hearing loss,  nosebleeds, sinus pain, sore throat and tinnitus.   Eyes: Negative.  Negative for blurred vision, double vision, photophobia, pain, discharge and  redness.  Respiratory: Negative.  Negative for cough, hemoptysis, sputum production, shortness of breath, wheezing and stridor.   Cardiovascular: Positive for leg swelling. Negative for chest pain, palpitations, orthopnea, claudication and PND.  Gastrointestinal: Negative.  Negative for abdominal pain, blood in stool, constipation, diarrhea, heartburn, melena, nausea and vomiting.  Genitourinary: Negative.  Negative for dysuria, flank pain, frequency, hematuria and urgency.  Musculoskeletal: Negative.  Negative for back pain, falls, joint pain, myalgias and neck pain.  Skin: Negative.  Negative for itching and rash.  Neurological: Positive for weakness. Negative for dizziness, tingling, tremors, sensory change, speech change, focal weakness, seizures, loss of consciousness and headaches.  Endo/Heme/Allergies: Negative for environmental allergies and polydipsia. Does not bruise/bleed easily.  Psychiatric/Behavioral: Positive for depression. Negative for hallucinations, memory loss, substance abuse and suicidal ideas. The patient is not nervous/anxious and does not have insomnia.     Vital Signs: Blood pressure (!) 155/76, pulse 80, temperature 98.7 F (37.1 C), temperature source Oral, resp. rate 19, height 6\' 4"  (1.93 m), weight (!) 159.2 kg (351 lb), SpO2 99 %.  Weight trends: Filed Weights   10/01/17 1038  Weight: (!) 159.2 kg (351 lb)    Physical Exam: General: NAD,   Head: Normocephalic, atraumatic. Moist oral mucosal membranes  Eyes: Anicteric, PERRL  Neck: Supple, trachea midline  Lungs:  Clear to auscultation  Heart: Regular rate and rhythm  Abdomen:  Soft, nontender, obese  Extremities:  1+ peripheral edema.  Neurologic: Nonfocal, moving all four extremities  Skin: Left lower extremity +erythema         Lab results: Basic  Metabolic Panel: Recent Labs  Lab 10/01/17 1035 10/01/17 1333  NA 131*  --   K 4.1  --   CL 97*  --   CO2 25  --   GLUCOSE 477*  --   BUN 56*  --   CREATININE 2.13* 2.22*  CALCIUM 8.8*  --     Liver Function Tests: No results for input(s): AST, ALT, ALKPHOS, BILITOT, PROT, ALBUMIN in the last 168 hours. No results for input(s): LIPASE, AMYLASE in the last 168 hours. No results for input(s): AMMONIA in the last 168 hours.  CBC: Recent Labs  Lab 10/01/17 1035 10/01/17 1333  WBC 17.1* 17.0*  HGB 12.0* 11.3*  HCT 38.1* 35.3*  MCV 89.0 88.2  PLT 365 312    Cardiac Enzymes: No results for input(s): CKTOTAL, CKMB, CKMBINDEX, TROPONINI in the last 168 hours.  BNP: Invalid input(s): POCBNP  CBG: Recent Labs  Lab 10/01/17 1035 10/01/17 1224 10/01/17 1334 10/01/17 1637  GLUCAP 446* 472* 419* 395*    Microbiology: No results found for this or any previous visit.  Coagulation Studies: No results for input(s): LABPROT, INR in the last 72 hours.  Urinalysis: No results for input(s): COLORURINE, LABSPEC, PHURINE, GLUCOSEU, HGBUR, BILIRUBINUR, KETONESUR, PROTEINUR, UROBILINOGEN, NITRITE, LEUKOCYTESUR in the last 72 hours.  Invalid input(s): APPERANCEUR    Imaging: US Venous Img Lower Unilateral Left  Result Date: 10/01/2017 CLINICAL DATA:  Left lower extremity swelling x4 weeks EXAM: LEFT LOWER EXTREMITY VENOUS DOPPLER ULTRASOUND TECHNIQUE: Gray-scale sonography with compression, as well as color and duplex ultrasound, were performed to evaluate the deep venous system from the level of the common femoral vein through the popliteal and proximal calf veins. COMPARISON:  04/06/2004 by report only FINDINGS: Normal compressibility of the common femoral, superficial femoral, and popliteal veins, as well as the proximal calf veins. No filling defects to suggest DVT on grayscale or color Doppler imaging.  Doppler waveforms show normal direction of venous flow, normal respiratory  phasicity and response to augmentation. Prominent left inguinal lymph node measuring 2 cm short axis diameter, possibly reactive but nonspecific. Subcutaneous edema in the calf. Survey views of the contralateral common femoral vein are unremarkable. IMPRESSION: No evidence of LEFT lower extremity deep vein thrombosis. Electronically Signed   By: Lucrezia Europe M.D.   On: 10/01/2017 12:48      Assessment & Plan: [name] is a 47 y.o. black male with diabetes mellitus type II, diabetic retinopathy, diabetic neuropathy, hypertension, obesiy, venous insufficiency, angioedema with ACE-I who was admitted to [facility] on 10/01/2017 for Abscess [L02.91] Hyperglycemia [R73.9] Sepsis, due to unspecified organism [facility]) [A41.9] Acute renal failure, unspecified acute renal failure type ([place]) [N17.9] Swelling of lower leg [M79.89]   1. Acute Renal Failure on chronic kidney disease stage III with proteinuria: Baseline creatinine 1.6, GFR of 58 on 03/2017.  Chronic kidney disease secondary to diabetic nephropathy.  Acute renal failure with active infection of cellultis, prerenal azotemia  - Continue IV fluids - Check urine studies and renal ultrasound - Discussed chronic kidney disease and prognosis. Strong family history of ESRD with father on dialysis.   2. Hypertension: blood pressure mildly elevated. Home regimen of amlodipine, atenolol and hydrochlorothiazide.  - Hold hydrochlorothiazide.  - Continue amlodipine and atenolol.   3. Diabetes mellitus type II with chronic kidney disease: insulin dependent. Not well controlled. Hemoglobin A1c >14%.  Follows with endocrinology, Dr. Eddie Dibbles.  - Hold metformin during hospitalization and acute renal failure.   4. Left lower extremity cellulitis: empiric antibiotics. Vancomycin and pip/tazo.  - Appreciate vascular input.   LOS: 0 Aristea Posada 2/2/20194:46 PM

## 2017-10-01 NOTE — Progress Notes (Signed)
Pharmacy Antibiotic Note  [name] is a 47 y.o. male admitted on 10/01/2017 with cellulitis.  Pharmacy has been consulted for zosyn and vancomycin dosing.  Plan: Vancomycin 1500mg  IV every 12 hours.  Goal trough 10-15 mcg/mL. Zosyn 3.375g IV q8h (4 hour infusion).  Weight: (!) 351 lb (159.2 kg)  Temp (24hrs), Avg:98.6 F (37 C), Min:98.6 F (37 C), Max:98.6 F (37 C)  Recent Labs  Lab 10/01/17 1035  WBC 17.1*  CREATININE 2.13*  LATICACIDVEN 1.8    Estimated Creatinine Clearance: 70.2 mL/min (A) (by C-G formula based on SCr of 2.13 mg/dL (H)).    Allergies  Allergen Reactions  . Lisinopril Swelling    Antimicrobials this admission: Anti-infectives (From admission, onward)   Start     Dose/Rate Route Frequency Ordered Stop   10/01/17 1800  piperacillin-tazobactam (ZOSYN) IVPB 3.375 g     3.375 g 12.5 mL/hr over 240 Minutes Intravenous Every 8 hours 10/01/17 1133     10/01/17 1400  vancomycin (VANCOCIN) 1,500 mg in sodium chloride 0.9 % 500 mL IVPB     1,500 mg 250 mL/hr over 120 Minutes Intravenous Every 12 hours 10/01/17 1156     10/01/17 1130  piperacillin-tazobactam (ZOSYN) IVPB 3.375 g     3.375 g 100 mL/hr over 30 Minutes Intravenous  Once 10/01/17 1120     10/01/17 1130  vancomycin (VANCOCIN) IVPB 1000 mg/200 mL premix     1,000 mg 200 mL/hr over 60 Minutes Intravenous  Once 10/01/17 1120        Microbiology results: No results found for this or any previous visit (from the past 240 hour(s)).   Thank you for allowing pharmacy to be a part of this patient's care.  Donna Christen [name] 10/01/2017 11:59 AM

## 2017-10-01 NOTE — ED Triage Notes (Signed)
Pt to ED via [facility] from home for bleeding from the lower leg and hyperglycemia. Pt currently being treated by vascular for left lower leg edema. Pt has been wearing IT trainer since January 22. Pt states that boot was changed on Tuesday. Pt states that his foot was oozing blood at that time. Pt states that the bleeding has not gotten worse but he felt like he would not be able to wait until Tuesday to be seen. Pt also hyperglycemic. Pt has hx/o DM and takes metformin and lantus PRN. Pt has not been able to take medications for the past few days because he has been feeling nauseated. Pt in NAD at this time.

## 2017-10-01 NOTE — Consult Note (Signed)
[facility] Consult Note  MRN : [id number]  [name] is a 47 y.o. (Apr 19, 1971) male who presents with chief complaint of  Chief Complaint  Patient presents with  . Hyperglycemia  . Wound Check  .  History of Present Illness: I am asked to evaluate the patient by [name].  Patient is known to our service he sees [name] in the office.  He is a 47 year old gentleman with a long history of venous insufficiency and profound venous stasis dermatitis and lymphedema.  He was seen in the office recently for worsening control of his left lower extremity and placed in an [employer].  He presented to [facility] earlier today with worsening pain and increased drainage as well as worsening swelling of his left lower extremity.  He had to cut his Unna boot off.  He denies fever chills although at the time of my interview he is wrapped in multiple blankets because he is "cold".  Current Facility-Administered Medications  Medication Dose Route Frequency Provider Last Rate Last Dose  . 0.9 %  sodium chloride infusion   Intravenous Continuous [name], MD 100 mL/hr at 10/01/17 1328    . acetaminophen (TYLENOL) tablet 650 mg  650 mg Oral Q6H PRN [name], MD       Or  . acetaminophen (TYLENOL) suppository 650 mg  650 mg Rectal Q6H PRN [name], MD      . amLODipine (NORVASC) tablet 10 mg  10 mg Oral Daily [name], MD      . atenolol (TENORMIN) tablet 25 mg  25 mg Oral Daily [name], MD      . bisacodyl (DULCOLAX) suppository 10 mg  10 mg Rectal Daily PRN [name], MD      . Derrill Memo ON 10/02/2017] canagliflozin [facility]) tablet 100 mg  100 mg Oral QAC breakfast [name], MD      . docusate sodium (COLACE) capsule 100 mg  100 mg Oral BID [name], MD      . enoxaparin (LOVENOX) injection 40 mg  40 mg Subcutaneous Q24H [name], MD      . hydrochlorothiazide  (HYDRODIURIL) tablet 25 mg  25 mg Oral Daily [name], MD      . insulin aspart (novoLOG) injection 0-9 Units  0-9 Units Subcutaneous TID WC [name], MD      . insulin glargine (LANTUS) injection 20 Units  20 Units Subcutaneous QHS [name], MD      . metFORMIN (GLUCOPHAGE-XR) 24 hr tablet 500 mg  500 mg Oral BID WC [name], MD      . morphine 2 MG/ML injection 2 mg  2 mg Intravenous Q2H PRN [name], MD      . ondansetron (ZOFRAN) tablet 4 mg  4 mg Oral Q6H PRN [name], MD       Or  . ondansetron (ZOFRAN) injection 4 mg  4 mg Intravenous Q6H PRN [name], MD      . pantoprazole (PROTONIX) injection 40 mg  40 mg Intravenous Q12H [name], MD      . piperacillin-tazobactam (ZOSYN) IVPB 3.375 g  3.375 g Intravenous Q8H Coffee, Donna Christen, [facility]      . [START ON 10/02/2017] pneumococcal 23 valent vaccine (PNU-IMMUNE) injection 0.5 mL  0.5 mL Intramuscular Tomorrow-1000 [name], MD      .  vancomycin (VANCOCIN) 1,500 mg in sodium chloride 0.9 % 500 mL IVPB  1,500 mg Intravenous Q12H Coffee, Donna Christen, [facility]        Past Medical History:  Diagnosis Date  . Diabetes mellitus without complication ([place])   . Hypertension   . Microalbuminuria   . Vitamin D deficiency     History reviewed. No pertinent surgical history.  Social History Social History   Tobacco Use  . Smoking status: Never Smoker  . Smokeless tobacco: Never Used  Substance Use Topics  . Alcohol use: Yes    Comment: beer  . Drug use: Not on file    Family History Family History  Problem Relation Age of Onset  . Diabetes Father   . Kidney disease Father   . Diabetes Daughter   No family history of bleeding/clotting disorders, porphyria or autoimmune disease   Allergies  Allergen Reactions  . Lisinopril Swelling     REVIEW OF SYSTEMS (Negative unless checked)  Constitutional: [] Weight loss  [] Fever  [] Chills Cardiac: [] Chest pain   [] Chest  pressure   [] Palpitations   [] Shortness of breath when laying flat   [] Shortness of breath at rest   [] Shortness of breath with exertion. Vascular:  [] Pain in legs with walking   [x] Pain in legs standing   [] Pain in legs when laying flat   [] Claudication   [] Pain in feet when walking  [] Pain in feet at rest  [] Pain in feet when laying flat   [] History of DVT   [] Phlebitis   [x] Swelling in legs   [] Varicose veins   [x] Non-healing ulcers Pulmonary:   [] Uses home oxygen   [] Productive cough   [] Hemoptysis   [] Wheeze  [] COPD   [] Asthma Neurologic:  [] Dizziness  [] Blackouts   [] Seizures   [] History of stroke   [] History of TIA  [] Aphasia   [] Temporary blindness   [] Dysphagia   [] Weakness or numbness in arms   [] Weakness or numbness in legs Musculoskeletal:  [] Arthritis   [] Joint swelling   [] Joint pain   [] Low back pain Hematologic:  [] Easy bruising  [] Easy bleeding   [] Hypercoagulable state   [] Anemic  [] Hepatitis Gastrointestinal:  [] Blood in stool   [] Vomiting blood  [] Gastroesophageal reflux/heartburn   [] Difficulty swallowing. Genitourinary:  [] Chronic kidney disease   [] Difficult urination  [] Frequent urination  [] Burning with urination   [] Blood in urine Skin:  [x] Rashes   [x] Ulcers   [x] Wounds Psychological:  [] History of anxiety   []  History of major depression.  Physical Examination  Vitals:   10/01/17 1100 10/01/17 1145 10/01/17 1200 10/01/17 1330  BP: (!) 156/84 (!) 164/78 (!) 162/84 (!) 155/76  Pulse: 80 78 77 80  Resp: 19 13 15 19   Temp:    98.7 F (37.1 C)  TempSrc:    Oral  SpO2: 99% 99% 99% 99%  Weight:      Height:    6\' 4"  (1.93 m)   Body mass index is 42.73 kg/m. Gen:  WD/WN, NAD Head: [place]/AT, No temporalis wasting. Prominent temp pulse not noted. Ear/Nose/Throat: Hearing grossly intact, nares w/o erythema or drainage, oropharynx w/o Erythema/Exudate Eyes: Sclera non-icteric, conjunctiva clear Neck: Trachea midline.  No JVD.  Pulmonary:  Good air movement, respirations  not labored, equal bilaterally.  Cardiac: RRR, normal S1, S2. Vascular: massive edema of the left leg with severe venous changes of the left leg.  Venous ulcer noted in the ankle area on the left bilaterally, grossly infected  No fluctuance no pointing abcess noted Vessel Right Left  Radial Palpable Palpable  PT Palpable Palpable  DP Palpable Palpable   Gastrointestinal: soft, non-tender/non-distended. No guarding/reflex.  Musculoskeletal: M/S 5/5 throughout.  Extremities without ischemic changes.  No deformity or atrophy. No edema. Neurologic: Sensation grossly intact in extremities.  Symmetrical.  Speech is fluent. Motor exam as listed above. Psychiatric: Judgment intact, Mood & affect appropriate for pt's clinical situation. Dermatologic: Venous stasis dermatitis with gross cellulits with ulcers present on the left.  + cellulitis with open wounds. Lymph : No Cervical, Axillary, or Inguinal lymphadenopathy.      CBC Lab Results  Component Value Date   WBC 17.0 (H) 10/01/2017   HGB 11.3 (L) 10/01/2017   HCT 35.3 (L) 10/01/2017   MCV 88.2 10/01/2017   PLT 312 10/01/2017    BMET    Component Value Date/Time   NA 131 (L) 10/01/2017 1035   K 4.1 10/01/2017 1035   CL 97 (L) 10/01/2017 1035   CO2 25 10/01/2017 1035   GLUCOSE 477 (H) 10/01/2017 1035   BUN 56 (H) 10/01/2017 1035   CREATININE 2.22 (H) 10/01/2017 1333   CALCIUM 8.8 (L) 10/01/2017 1035   GFRNONAA 34 (L) 10/01/2017 1333   GFRAA 39 (L) 10/01/2017 1333   Estimated Creatinine Clearance: 67.4 mL/min (A) (by C-G formula based on SCr of 2.22 mg/dL (H)).  COAG No results found for: INR, PROTIME  Radiology US Venous Img Lower Unilateral Left  Result Date: 10/01/2017 CLINICAL DATA:  Left lower extremity swelling x4 weeks EXAM: LEFT LOWER EXTREMITY VENOUS DOPPLER ULTRASOUND TECHNIQUE: Gray-scale sonography with compression, as well as color and duplex ultrasound, were performed to evaluate the deep venous system from the  level of the common femoral vein through the popliteal and proximal calf veins. COMPARISON:  04/06/2004 by report only FINDINGS: Normal compressibility of the common femoral, superficial femoral, and popliteal veins, as well as the proximal calf veins. No filling defects to suggest DVT on grayscale or color Doppler imaging. Doppler waveforms show normal direction of venous flow, normal respiratory phasicity and response to augmentation. Prominent left inguinal lymph node measuring 2 cm short axis diameter, possibly reactive but nonspecific. Subcutaneous edema in the calf. Survey views of the contralateral common femoral vein are unremarkable. IMPRESSION: No evidence of LEFT lower extremity deep vein thrombosis. Electronically Signed   By: Lucrezia Europe M.D.   On: 10/01/2017 12:48      Assessment/Plan 1.  Cellulitis with venous stasis dermatitis and venous ulcerations left lower extremity: I do not detect overt evidence of a abscess although he does have bilateral ankle wounds which are draining.  At this time I agree with parenteral antibiotics elevation keep his leg wrapped to control drainage but I will not put a medicated wrap back on because of his tenderness.  Tomorrow depending on my follow-up assessment CT scan of the lower extremity can be obtained to positively exclude abscess formation.  Ultimately once his infection is controlled I would return him to [employer] therapy for treatment of his venous ulcers. 2.  Diabetes mellitus: Currently the patient is not under good glycemic control.  This will be corrected.  Diabetic diet as prescribed. 3.  Hypertension: Patient will be continued on his outpatient therapy vital signs will be used to monitor his blood pressure.   [name], MD  10/01/2017 3:21 PM    This note was created with Dragon medical transcription system.  Any error is purely unintentional

## 2017-10-01 NOTE — H&P (Signed)
History and Physical    [name] KGU:[id number] DOB: 06-14-1971 DOA: 10/01/2017  Referring physician: Dr. Clearnce [name] PCP: [name] Martes Victoriano Lain, [name]  Specialists: Dr. Lucky [name]  Chief Complaint: LE pain and bleeding  HPI: [name] is a 47 y.o. male has a past medical history significant for HTN and DM recently seen by Dr. Lucky [name] for LLE edema now with worsening pain and swelling of LLE with bloody discharge. Also c/o worsening hyperglycemia and nausea. No fever. Denies CP or SOB. No vomiting or diarrhea. In ER, WBC elevated and copius amounts fo bloody drainage oozing from LLE. Sugar >400. He is now admitted.  Review of Systems: The patient denies fever, weight loss,, vision loss, decreased hearing, hoarseness, chest pain, syncope, dyspnea on exertion, balance deficits, hemoptysis, abdominal pain, melena, hematochezia, severe indigestion/heartburn, hematuria, incontinence, genital sores, muscle weakness, suspicious skin lesions, transient blindness,  depression, unusual weight change, abnormal bleeding, enlarged lymph nodes, angioedema, and breast masses.   Past Medical History:  Diagnosis Date  . Diabetes mellitus without complication ([name])   . Hypertension   . Microalbuminuria   . Vitamin D deficiency    History reviewed. No pertinent surgical history. Social History:  reports that  has never smoked. he has never used smokeless tobacco. He reports that he drinks alcohol. His drug history is not on file.  Allergies  Allergen Reactions  . Lisinopril Swelling    Family History  Problem Relation Age of Onset  . Diabetes Father   . Kidney disease Father   . Diabetes Daughter     Prior to Admission medications   Medication Sig Start Date End Date Taking? Authorizing Provider  amLODipine (NORVASC) 10 MG tablet Take 10 mg by mouth daily.  06/06/17  Yes [provider]  atenolol (TENORMIN) 25 MG tablet Take 25 mg by mouth daily. 09/05/17  Yes [provider]   empagliflozin (JARDIANCE) 25 MG TABS tablet Take 25 mg by mouth daily.  04/28/17  Yes [provider]  hydrochlorothiazide (HYDRODIURIL) 25 MG tablet Take 25 mg by mouth daily.  04/28/17  Yes [provider]  metFORMIN (GLUCOPHAGE-XR) 500 MG 24 hr tablet TAKE 2 TABLETS (1,000 MG TOTAL) BY MOUTH ONCE DAILY.(NEED APPT FOR FURTHER REFILLS) 07/20/17  Yes [provider]  gentamicin cream (GARAMYCIN) 0.1 % Apply 1 application topically 3 (three) times daily.    [provider]  Insulin Glargine (LANTUS SOLOSTAR) 100 UNIT/ML Solostar Pen Inject 0-20 Units into the skin as needed.     [provider]   Physical Exam: Vitals:   10/01/17 1030 10/01/17 1037 10/01/17 1038 10/01/17 1100  BP: 140/70 140/70  (!) 156/84  Pulse: 78 81  80  Resp: (!) 27 11  19   Temp:  98.6 F (37 C)    TempSrc:  Oral    SpO2: 99% 99%  99%  Weight:   (!) 159.2 kg (351 lb)      General:  No apparent distress, WDWN, [place]/AT  Eyes: PERRL, EOMI, no scleral icterus, conjunctiva clear  ENT: moist oropharynx without exudate, TM's benign, dentition good  Neck: supple, no lymphadenopathy. No bruits or thyromegaly  Cardiovascular: regular rate without MRG; 1+ peripheral pulses in LLE, no JVD, 3+ peripheral edema on left  Respiratory: CTA biL, good air movement without wheezing, rhonchi or crackled. Respiratory effort normal  Abdomen: soft, non tender to palpation, positive bowel sounds, no guarding, no rebound  Skin: no rashes. Diffuse erythema and oozing blood from LLE with ulcerations noted.  Musculoskeletal: normal bulk and tone, no joint swelling  Psychiatric: normal mood and affect, A&OX3  Neurologic: CN 2-12 grossly intact, Motor strength 5/5 in all 4 groups with symmetric DTR's and non-focal sensory exam  Labs on Admission:  Basic Metabolic Panel: Recent Labs  Lab 10/01/17 1035  NA 131*  K 4.1  CL 97*  CO2 25  GLUCOSE 477*  BUN 56*  CREATININE 2.13*  CALCIUM  8.8*   Liver Function Tests: No results for input(s): AST, ALT, ALKPHOS, BILITOT, PROT, ALBUMIN in the last 168 hours. No results for input(s): LIPASE, AMYLASE in the last 168 hours. No results for input(s): AMMONIA in the last 168 hours. CBC: Recent Labs  Lab 10/01/17 1035  WBC 17.1*  HGB 12.0*  HCT 38.1*  MCV 89.0  PLT 365   Cardiac Enzymes: No results for input(s): CKTOTAL, CKMB, CKMBINDEX, TROPONINI in the last 168 hours.  BNP (last 3 results) No results for input(s): BNP in the last 8760 hours.  ProBNP (last 3 results) No results for input(s): PROBNP in the last 8760 hours.  CBG: Recent Labs  Lab 10/01/17 1035  GLUCAP 446*    Radiological Exams on Admission: No results found.  EKG: Independently reviewed.  Assessment/Plan Principal Problem:   Cellulitis Active Problems:   Diabetes mellitus without complication (HCC)   Swelling of limb   PVD (peripheral vascular disease) ([place])   Will admit to floor with IV fluids and IV ABX. Cultures sent. Follow sugars and add SSI. LLE [place] ordered. Consult Vascular. Repeat labs in AM  Diet: low carb Fluids: NS@100  DVT Prophylaxis: Lovenox  Code Status: FULL  Family Communication: yes  Disposition Plan: home  Time spent: 50 min

## 2017-10-01 NOTE — ED Provider Notes (Addendum)
[facility] Emergency Department Provider Note  ____________________________________________   First MD Initiated Contact with Patient 10/01/17 1101     (approximate)  I have reviewed the triage vital signs and the nursing notes.   HISTORY  Chief Complaint Hyperglycemia and Wound Check   HPI [name] is a 47 y.o. male with a history of diabetes, hypertension as well as venous reflux under the care of the vascular surgeons here at [facility] was presenting to the emergency department with persistent drainage from his left lower extremity as well as worsening redness.  He says that he has been wearing an Haematologist and had his last Unna boot change this past Tuesday.  He denies any fever but says that he has lost his appetite.   Past Medical History:  Diagnosis Date  . Diabetes mellitus without complication ([place])   . Hypertension   . Microalbuminuria   . Vitamin D deficiency     Patient Active Problem List   Diagnosis Date Noted  . Hypertension 09/20/2017  . Diabetes mellitus without complication ([place]) 85/88/5027  . Swelling of limb 09/20/2017    History reviewed. No pertinent surgical history.  Prior to Admission medications   Medication Sig Start Date End Date Taking? Authorizing Provider  amLODipine (NORVASC) 10 MG tablet Take 10 mg by mouth daily.  06/06/17  Yes [provider]  atenolol (TENORMIN) 25 MG tablet Take 25 mg by mouth daily. 09/05/17  Yes [provider]  empagliflozin (JARDIANCE) 25 MG TABS tablet Take 25 mg by mouth daily.  04/28/17  Yes [provider]  hydrochlorothiazide (HYDRODIURIL) 25 MG tablet Take 25 mg by mouth daily.  04/28/17  Yes [provider]  metFORMIN (GLUCOPHAGE-XR) 500 MG 24 hr tablet TAKE 2 TABLETS (1,000 MG TOTAL) BY MOUTH ONCE DAILY.(NEED APPT FOR FURTHER REFILLS) 07/20/17  Yes [provider]  gentamicin cream (GARAMYCIN) 0.1 % Apply 1 application topically 3  (three) times daily.    [provider]  Insulin Glargine (LANTUS SOLOSTAR) 100 UNIT/ML Solostar Pen Inject 0-20 Units into the skin as needed.     [provider]    Allergies Lisinopril  Family History  Problem Relation Age of Onset  . Diabetes Father   . Kidney disease Father   . Diabetes Daughter     Social History Social History   Tobacco Use  . Smoking status: Never Smoker  . Smokeless tobacco: Never Used  Substance Use Topics  . Alcohol use: Yes    Comment: beer  . Drug use: Not on file    Review of Systems  Constitutional: No fever/chills Eyes: No visual changes. ENT: No sore throat. Cardiovascular: Denies chest pain. Respiratory: Denies shortness of breath. Gastrointestinal: No abdominal pain.  No nausea, no vomiting.  No diarrhea.  No constipation. Genitourinary: Negative for dysuria. Musculoskeletal: Negative for back pain. Skin: As above Neurological: Negative for headaches, focal weakness or numbness.   ____________________________________________   PHYSICAL EXAM:  VITAL SIGNS: ED Triage Vitals  Enc Vitals Group     BP 10/01/17 1030 140/70     Pulse Rate 10/01/17 1030 78     Resp 10/01/17 1030 (!) 27     Temp 10/01/17 1037 98.6 F (37 C)     Temp Source 10/01/17 1037 Oral     SpO2 10/01/17 1028 98 %     Weight 10/01/17 1038 (!) 351 lb (159.2 kg)     Height --      Head Circumference --  Peak Flow --      Pain Score 10/01/17 1037 7     Pain Loc --      Pain Edu? --      Excl. in [place]? --     Constitutional: Alert and oriented. Well appearing and in no acute distress. Eyes: Conjunctivae are normal.  Head: Atraumatic. Nose: No congestion/rhinnorhea. Mouth/Throat: Mucous membranes are moist.  Neck: No stridor.   Cardiovascular: Normal rate, regular rhythm. Grossly normal heart sounds.  Good peripheral circulation. Respiratory: Normal respiratory effort.  No retractions. Lungs CTAB. Gastrointestinal: Soft and  nontender. No distention.  Musculoskeletal:  Bilateral lower extremity edema.  However, there is severe edema to the left lower extremity where there is only mild edema in the right lower extremity.  Patient with sensation to light touch the bilateral lower extremities.  Dorsalis pedis pulses are present and equal bilaterally.  Left lower extremity with erythema extending from the ankle up to just distal to the left knee.  Almost circumferentially around the ankle is in exfoliation of what appears to be the keratinized epithelium.  Medially, the subdermal tissue appears healthy and is not weeping any pus or serous fluid.  However, laterally there is an area where there is actively draining pus that is foul-smelling.  The tissue is friable and appears on perfused.  No crepitus.  Pain is not out of proportion to exam.  Neurologic:  Normal speech and language. No gross focal neurologic deficits are appreciated. Skin:  Skin is warm, dry and intact. No rash noted. Psychiatric: Mood and affect are normal. Speech and behavior are normal.  ____________________________________________   LABS (all labs ordered are listed, but only abnormal results are displayed)  Labs Reviewed  GLUCOSE, CAPILLARY - Abnormal; Notable for the following components:      Result Value   Glucose-Capillary 446 (*)    All other components within normal limits  BASIC METABOLIC PANEL - Abnormal; Notable for the following components:   Sodium 131 (*)    Chloride 97 (*)    Glucose, Bld 477 (*)    BUN 56 (*)    Creatinine, Ser 2.13 (*)    Calcium 8.8 (*)    GFR calc non Af [name] 35 (*)    GFR calc Af [name] 41 (*)    All other components within normal limits  CBC - Abnormal; Notable for the following components:   WBC 17.1 (*)    RBC 4.28 (*)    Hemoglobin 12.0 (*)    HCT 38.1 (*)    MCHC 31.5 (*)    All other components within normal limits  CULTURE, BLOOD (ROUTINE X 2)  CULTURE, BLOOD (ROUTINE X 2)  LACTIC ACID, PLASMA   LACTIC ACID, PLASMA  URINALYSIS, ROUTINE W REFLEX MICROSCOPIC  CBG MONITORING, ED   ____________________________________________  EKG   ____________________________________________  RADIOLOGY   ____________________________________________   PROCEDURES  Procedure(s) performed:   Procedures  Critical Care performed:   ____________________________________________   INITIAL IMPRESSION / ASSESSMENT AND PLAN / ED COURSE  Pertinent labs & imaging results that were available during my care of the patient were reviewed by me and considered in my medical decision making (see chart for details).  DDX: Abscess, cellulitis, sepsis, peripheral edema As part of my medical decision making, I reviewed the following data within the [name] chart reviewed  ----------------------------------------- 11:39 AM on 10/01/2017 -----------------------------------------  Sepsis alert initiated.  Broad-spectrum antibiotics ordered.  Fluids running at this time.  Patient  aware of the need for admission to the hospital.  Awaiting call from vascular service as well.  Signed patient out to [name] who accepts the patient onto his service.  Patient and family aware of the need for admission to the hospital.  I do not suspect necrotizing fasciitis at this time.  No crepitus.  Pain is not out of proportion to exam.     ____________________________________________   FINAL CLINICAL IMPRESSION(S) / ED DIAGNOSES  Cellulitis of the left lower extremity.  Abscess.  Acute renal failure.  Hyperglycemia.    NEW MEDICATIONS STARTED DURING THIS VISIT:  New Prescriptions   No medications on file     Note:  This document was prepared using Dragon voice recognition software and may include unintentional dictation errors.     Orbie Pyo, MD 10/01/17 1140  Discussed case with Dr. Delana Meyer who says that he will see the patient is a consult.    Orbie Pyo, MD 10/01/17 [phone]

## 2017-10-02 DIAGNOSIS — L03116 Cellulitis of left lower limb: Secondary | ICD-10-CM

## 2017-10-02 LAB — GLUCOSE, CAPILLARY
GLUCOSE-CAPILLARY: 232 mg/dL — AB (ref 65–99)
Glucose-Capillary: 346 mg/dL — ABNORMAL HIGH (ref 65–99)
Glucose-Capillary: 316 mg/dL — ABNORMAL HIGH (ref 65–99)
Glucose-Capillary: 187 mg/dL — ABNORMAL HIGH (ref 65–99)

## 2017-10-02 LAB — CBC
HEMATOCRIT: 34.3 % — AB (ref 40.0–52.0)
HEMOGLOBIN: 11 g/dL — AB (ref 13.0–18.0)
MCH: 28.5 pg (ref 26.0–34.0)
MCHC: 32 g/dL (ref 32.0–36.0)
MCV: 89.1 fL (ref 80.0–100.0)
Platelets: 281 10*3/uL (ref 150–440)
RBC: 3.85 MIL/uL — ABNORMAL LOW (ref 4.40–5.90)
RDW: 14.1 % (ref 11.5–14.5)
WBC: 13.3 10*3/uL — ABNORMAL HIGH (ref 3.8–10.6)

## 2017-10-02 LAB — COMPREHENSIVE METABOLIC PANEL
ALBUMIN: 1.7 g/dL — AB (ref 3.5–5.0)
ALK PHOS: 112 U/L (ref 38–126)
ALT: 15 U/L — ABNORMAL LOW (ref 17–63)
ANION GAP: 8 (ref 5–15)
AST: 18 U/L (ref 15–41)
BILIRUBIN TOTAL: 0.9 mg/dL (ref 0.3–1.2)
BUN: 56 mg/dL — ABNORMAL HIGH (ref 6–20)
CALCIUM: 8.3 mg/dL — AB (ref 8.9–10.3)
CO2: 23 mmol/L (ref 22–32)
Chloride: 102 mmol/L (ref 101–111)
Creatinine, Ser: 2.1 mg/dL — ABNORMAL HIGH (ref 0.61–1.24)
GFR calc Af Amer: 42 mL/min — ABNORMAL LOW (ref >60)
GFR calc non Af Amer: 36 mL/min — ABNORMAL LOW (ref >60)
GLUCOSE: 386 mg/dL — AB (ref 65–99)
Potassium: 3.8 mmol/L (ref 3.5–5.1)
Sodium: 133 mmol/L — ABNORMAL LOW (ref 135–145)
TOTAL PROTEIN: 7.3 g/dL (ref 6.5–8.1)

## 2017-10-02 MED ORDER — INSULIN GLARGINE 100 UNIT/ML ~~LOC~~ SOLN
24.0000 [IU] | Freq: Every day | SUBCUTANEOUS | Status: DC
  Filled 2017-10-02: qty 0.24

## 2017-10-02 MED ORDER — SODIUM CHLORIDE 0.9 % IV SOLN
INTRAVENOUS | Status: DC
  Administered 2017-10-02 – 2017-10-03 (×4): via INTRAVENOUS

## 2017-10-02 MED ORDER — INSULIN GLARGINE 100 UNIT/ML ~~LOC~~ SOLN: 20.0000 [IU] | Freq: Every day | SUBCUTANEOUS | Status: DC

## 2017-10-02 MED ORDER — INSULIN GLARGINE 100 UNIT/ML ~~LOC~~ SOLN
15.0000 [IU] | Freq: Two times a day (BID) | SUBCUTANEOUS | Status: DC
  Administered 2017-10-02 – 2017-10-03 (×3): 15 [IU] via SUBCUTANEOUS
  Filled 2017-10-02 (×4): qty 0.15

## 2017-10-02 MED ORDER — INSULIN GLARGINE 100 UNIT/ML ~~LOC~~ SOLN
20.0000 [IU] | Freq: Two times a day (BID) | SUBCUTANEOUS | Status: DC
  Filled 2017-10-02: qty 0.2

## 2017-10-02 MED ORDER — INSULIN ASPART 100 UNIT/ML ~~LOC~~ SOLN
0.0000 [IU] | Freq: Three times a day (TID) | SUBCUTANEOUS | Status: DC
  Administered 2017-10-02: 11 [IU] via SUBCUTANEOUS
  Administered 2017-10-02: 5 [IU] via SUBCUTANEOUS
  Administered 2017-10-02: 11 [IU] via SUBCUTANEOUS
  Administered 2017-10-03: 3 [IU] via SUBCUTANEOUS
  Administered 2017-10-03: 5 [IU] via SUBCUTANEOUS
  Administered 2017-10-03 – 2017-10-05 (×4): 3 [IU] via SUBCUTANEOUS
  Administered 2017-10-05 – 2017-10-06 (×2): 2 [IU] via SUBCUTANEOUS
  Administered 2017-10-06 – 2017-10-09 (×4): 3 [IU] via SUBCUTANEOUS
  Administered 2017-10-09 – 2017-10-10 (×2): 2 [IU] via SUBCUTANEOUS
  Administered 2017-10-12 (×2): 3 [IU] via SUBCUTANEOUS
  Administered 2017-10-12: 5 [IU] via SUBCUTANEOUS
  Administered 2017-10-13 – 2017-10-19 (×8): 2 [IU] via SUBCUTANEOUS
  Filled 2017-10-02 (×28): qty 1

## 2017-10-02 MED ORDER — ENOXAPARIN SODIUM 40 MG/0.4ML ~~LOC~~ SOLN
40.0000 mg | Freq: Two times a day (BID) | SUBCUTANEOUS | Status: DC
  Administered 2017-10-02 – 2017-10-07 (×9): 40 mg via SUBCUTANEOUS
  Filled 2017-10-02 (×9): qty 0.4

## 2017-10-02 NOTE — Progress Notes (Signed)
[facility]  ROUNDING NOTE   Subjective:   Family and friends at bedside.   Creatinine 2.1 (2.23)  NS at 172mL/hr  Objective:  Vital signs in last 24 hours:  Temp:  [98.5 F (36.9 C)-99.4 F (37.4 C)] 98.9 F (37.2 C) (02/03 0717) Pulse Rate:  [72-81] 75 (02/03 0717) Resp:  [18-19] 19 (02/03 0717) BP: (132-155)/(68-78) 151/78 (02/03 0717) SpO2:  [95 %-99 %] 98 % (02/03 0717) Weight:  [159.5 kg (351 lb 9.6 oz)] 159.5 kg (351 lb 9.6 oz) (02/03 0500)  Weight change:  Filed Weights   10/01/17 1038 10/02/17 0500  Weight: (!) 159.2 kg (351 lb) (!) 159.5 kg (351 lb 9.6 oz)    Intake/Output: I/O last 3 completed shifts: In: 2623.3 [P.O.:120; I.V.:1453.3; IV Piggyback:1050] Out: 1200 [Urine:1200]   Intake/Output this shift:  Total I/O In: 240 [P.O.:240] Out: -   Physical Exam: General: NAD, laying in bed  Head: Normocephalic, atraumatic. Moist oral mucosal membranes  Eyes: Anicteric, PERRL  Neck: Supple, trachea midline  Lungs:  Clear to auscultation  Heart: Regular rate and rhythm  Abdomen:  Soft, nontender, obese  Extremities: Left leg cellulitis with induration and erythema, 1+ edema  Neurologic: Nonfocal, moving all four extremities  Skin: No lesions        Basic Metabolic Panel: Recent Labs  Lab 10/01/17 1035 10/01/17 1333 10/02/17 0439  NA 131*  --  133*  K 4.1  --  3.8  CL 97*  --  102  CO2 25  --  23  GLUCOSE 477*  --  386*  BUN 56*  --  56*  CREATININE 2.13* 2.22* 2.10*  CALCIUM 8.8*  --  8.3*    Liver Function Tests: Recent Labs  Lab 10/02/17 0439  AST 18  ALT 15*  ALKPHOS 112  BILITOT 0.9  PROT 7.3  ALBUMIN 1.7*   No results for input(s): LIPASE, AMYLASE in the last 168 hours. No results for input(s): AMMONIA in the last 168 hours.  CBC: Recent Labs  Lab 10/01/17 1035 10/01/17 1333 10/02/17 0439  WBC 17.1* 17.0* 13.3*  HGB 12.0* 11.3* 11.0*  HCT 38.1* 35.3* 34.3*  MCV 89.0 88.2 89.1  PLT 365 312 281     Cardiac Enzymes: No results for input(s): CKTOTAL, CKMB, CKMBINDEX, TROPONINI in the last 168 hours.  BNP: Invalid input(s): POCBNP  CBG: Recent Labs  Lab 10/01/17 1637 10/01/17 1800 10/01/17 2106 10/02/17 0727 10/02/17 1206  GLUCAP 395* 467* 399* 346* 316*    Microbiology: Results for orders placed or performed during the hospital encounter of 10/01/17  Blood Culture (routine x 2)     Status: None (Preliminary result)   Collection Time: 10/01/17 10:41 AM  Result Value Ref Range Status   Specimen Description BLOOD LEFT ANTECUBITAL  Final   Special Requests   Final    BOTTLES DRAWN AEROBIC AND ANAEROBIC Blood Culture results may not be optimal due to an excessive volume of blood received in culture bottles   Culture   Final    NO GROWTH < 24 HOURS Performed at [facility], [address] [place], [place] [address]    Report Status PENDING  Incomplete  Blood Culture (routine x 2)     Status: None (Preliminary result)   Collection Time: 10/01/17 11:31 AM  Result Value Ref Range Status   Specimen Description BLOOD BLOOD RIGHT ARM  Final   Special Requests   Final    BOTTLES DRAWN AEROBIC AND ANAEROBIC Blood Culture  adequate volume   Culture   Final    NO GROWTH < 24 HOURS Performed at [facility], [address] [place], [place] [address]    Report Status PENDING  Incomplete    Coagulation Studies: No results for input(s): LABPROT, INR in the last 72 hours.  Urinalysis: No results for input(s): COLORURINE, LABSPEC, PHURINE, GLUCOSEU, HGBUR, BILIRUBINUR, KETONESUR, PROTEINUR, UROBILINOGEN, NITRITE, LEUKOCYTESUR in the last 72 hours.  Invalid input(s): APPERANCEUR    Imaging: US Renal  Result Date: 10/01/2017 CLINICAL DATA:  Acute kidney failure, chronic kidney disease stage 3. EXAM: RENAL / URINARY TRACT ULTRASOUND COMPLETE COMPARISON:  None. FINDINGS: Right Kidney: Length: 10.5 cm. Echogenicity within normal limits. No mass or  hydronephrosis visualized. Left Kidney: Length: 11.7 cm. Echogenicity within normal limits. No mass or hydronephrosis visualized. Bladder: Appears normal for degree of bladder distention. IMPRESSION: Normal renal ultrasound. Electronically Signed   By: Marijo Conception, M.D.   On: 10/01/2017 19:29   US Venous Img Lower Unilateral Left  Result Date: 10/01/2017 CLINICAL DATA:  Left lower extremity swelling x4 weeks EXAM: LEFT LOWER EXTREMITY VENOUS DOPPLER ULTRASOUND TECHNIQUE: Gray-scale sonography with compression, as well as color and duplex ultrasound, were performed to evaluate the deep venous system from the level of the common femoral vein through the popliteal and proximal calf veins. COMPARISON:  04/06/2004 by report only FINDINGS: Normal compressibility of the common femoral, superficial femoral, and popliteal veins, as well as the proximal calf veins. No filling defects to suggest DVT on grayscale or color Doppler imaging. Doppler waveforms show normal direction of venous flow, normal respiratory phasicity and response to augmentation. Prominent left inguinal lymph node measuring 2 cm short axis diameter, possibly reactive but nonspecific. Subcutaneous edema in the calf. Survey views of the contralateral common femoral vein are unremarkable. IMPRESSION: No evidence of LEFT lower extremity deep vein thrombosis. Electronically Signed   By: Lucrezia Europe M.D.   On: 10/01/2017 12:48     Medications:   . piperacillin-tazobactam (ZOSYN)  IV Stopped (10/02/17 0850)  . vancomycin Stopped (10/02/17 0326)   . amLODipine  10 mg Oral Daily  . atenolol  25 mg Oral Daily  . docusate sodium  100 mg Oral BID  . enoxaparin (LOVENOX) injection  40 mg Subcutaneous BID  . Influenza vac split quadrivalent PF  0.5 mL Intramuscular Tomorrow-1000  . insulin aspart  0-15 Units Subcutaneous TID WC  . insulin glargine  15 Units Subcutaneous BID  . pantoprazole (PROTONIX) IV  40 mg Intravenous Q12H  . pneumococcal 23  valent vaccine  0.5 mL Intramuscular Tomorrow-1000   acetaminophen **OR** acetaminophen, bisacodyl, morphine injection, ondansetron **OR** ondansetron (ZOFRAN) IV  Assessment/ Plan:  [name] is a 47 y.o. black male with diabetes mellitus type II, diabetic retinopathy, diabetic neuropathy, hypertension, obesiy, venous insufficiency, angioedema with ACE-I who was admitted to [facility] on 10/01/2017 for left leg abscess and cellulitis.   1. Acute Renal Failure on chronic kidney disease stage III with proteinuria: Baseline creatinine 1.6, GFR of 58 on 03/2017.  Chronic kidney disease secondary to diabetic nephropathy.  Acute renal failure with active infection of cellultis, prerenal azotemia  Pending urine studies. Concern for nephrotic range proteinuria with albumin at 1.7.  - Continue IV fluids: NS at 122mL/hr - Ultrasound reviewed, no obstruction.  - Discussed chronic kidney disease and prognosis. Strong family history of ESRD with father on dialysis (followed by our practice).   2. Hypertension: Home regimen of amlodipine, atenolol and hydrochlorothiazide.  -  Hold hydrochlorothiazide.  - Continue amlodipine and atenolol.   3. Diabetes mellitus type II with chronic kidney disease: insulin dependent. Not well controlled. Hemoglobin A1c 15.1% on this admission.  Follows with endocrinology, Dr. Eddie Dibbles.  - Hold metformin during hospitalization and acute renal failure.   4. Left lower extremity cellulitis: empiric antibiotics. Vancomycin and pip/tazo.  - Appreciate vascular input.      LOS: 1 Connee Ikner 2/3/201912:50 PM

## 2017-10-02 NOTE — Progress Notes (Signed)
Anticoagulation monitoring(Lovenox):  46yo  male ordered Lovenox 40 mg Q24h  Filed Weights   10/01/17 1038 10/02/17 0500  Weight: (!) 351 lb (159.2 kg) (!) 351 lb 9.6 oz (159.5 kg)   BMI 42.8   Lab Results  Component Value Date   CREATININE 2.10 (H) 10/02/2017   CREATININE 2.22 (H) 10/01/2017   CREATININE 2.13 (H) 10/01/2017   Estimated Creatinine Clearance: 71.3 mL/min (A) (by C-G formula based on SCr of 2.1 mg/dL (H)). Hemoglobin & Hematocrit     Component Value Date/Time   HGB 11.0 (L) 10/02/2017 0439   HCT 34.3 (L) 10/02/2017 0439     Per Protocol for Patient with estCrcl > 30 ml/min and BMI > 40, will transition to Lovenox 40 mg BID.

## 2017-10-02 NOTE — Progress Notes (Addendum)
[place] [facility] [place] NAME: [name]    MR#:  [id number]  DATE OF BIRTH:  August 22, 1971  SUBJECTIVE:  Came in with increasing redness and excessive drainage from the left leg.  REVIEW OF SYSTEMS:   Review of Systems  Constitutional: Negative for chills, fever and weight loss.  HENT: Negative for ear discharge, ear pain and nosebleeds.   Eyes: Negative for blurred vision, pain and discharge.  Respiratory: Negative for sputum production, shortness of breath, wheezing and stridor.   Cardiovascular: Negative for chest pain, palpitations, orthopnea and PND.  Gastrointestinal: Negative for abdominal pain, diarrhea, nausea and vomiting.  Genitourinary: Negative for frequency and urgency.  Musculoskeletal: Negative for back pain and joint pain.  Neurological: Positive for weakness. Negative for sensory change, speech change and focal weakness.  Psychiatric/Behavioral: Negative for depression and hallucinations. The patient is not nervous/anxious.    Tolerating Diet:yes Tolerating PT: pending  DRUG ALLERGIES:   Allergies  Allergen Reactions  . Lisinopril Swelling    VITALS:  Blood pressure (!) 151/78, pulse 75, temperature 98.9 F (37.2 C), temperature source Oral, resp. rate 19, height 6\' 4"  (1.93 m), weight (!) 159.5 kg (351 lb 9.6 oz), SpO2 98 %.  PHYSICAL EXAMINATION:   Physical Exam  GENERAL:  47 y.o.-year-old patient lying in the bed with no acute distress.  EYES: Pupils equal, round, reactive to light and accommodation. No scleral icterus. Extraocular muscles intact.  HEENT: Head atraumatic, normocephalic. Oropharynx and nasopharynx clear.  NECK:  Supple, no jugular venous distention. No thyroid enlargement, no tenderness.  LUNGS: Normal breath sounds bilaterally, no wheezing, rales, rhonchi. No use of accessory muscles of respiration.  CARDIOVASCULAR: S1, S2 normal. No murmurs, rubs, or gallops.  ABDOMEN: Soft, nontender,  nondistended. Bowel sounds present. No organomegaly or mass.  EXTREMITIES: Left leg edema with posterior ankle 6 x 6 cm necrotic tissue with area of purulence and drainage.  cellulitis present. NEUROLOGIC: Cranial nerves II through XII are intact. No focal Motor or sensory deficits b/l.   PSYCHIATRIC:  patient is alert and oriented x 3.  SKIN: No obvious rash, lesion, or ulcer.   LABORATORY PANEL:  CBC Recent Labs  Lab 10/02/17 0439  WBC 13.3*  HGB 11.0*  HCT 34.3*  PLT 281    Chemistries  Recent Labs  Lab 10/02/17 0439  NA 133*  K 3.8  CL 102  CO2 23  GLUCOSE 386*  BUN 56*  CREATININE 2.10*  CALCIUM 8.3*  AST 18  ALT 15*  ALKPHOS 112  BILITOT 0.9   Cardiac Enzymes No results for input([name]): TROPONINI in the last 168 hours. RADIOLOGY:  US Renal  Result Date: 10/01/2017 CLINICAL DATA:  Acute kidney failure, chronic kidney disease stage 3. EXAM: RENAL / URINARY TRACT ULTRASOUND COMPLETE COMPARISON:  None. FINDINGS: Right Kidney: Length: 10.5 cm. Echogenicity within normal limits. No mass or hydronephrosis visualized. Left Kidney: Length: 11.7 cm. Echogenicity within normal limits. No mass or hydronephrosis visualized. Bladder: Appears normal for degree of bladder distention. IMPRESSION: Normal renal ultrasound. Electronically Signed   By: Marijo Conception, [name]   On: 10/01/2017 19:29   US Venous Img Lower Unilateral Left  Result Date: 10/01/2017 CLINICAL DATA:  Left lower extremity swelling x4 weeks EXAM: LEFT LOWER EXTREMITY VENOUS DOPPLER ULTRASOUND TECHNIQUE: Gray-scale sonography with compression, as well as color and duplex ultrasound, were performed to evaluate the deep venous system from the level of the common femoral vein through the popliteal and  proximal calf veins. COMPARISON:  04/06/2004 by report only FINDINGS: Normal compressibility of the common femoral, superficial femoral, and popliteal veins, as well as the proximal calf veins. No filling defects to suggest DVT  on grayscale or color Doppler imaging. Doppler waveforms show normal direction of venous flow, normal respiratory phasicity and response to augmentation. Prominent left inguinal lymph node measuring 2 cm short axis diameter, possibly reactive but nonspecific. Subcutaneous edema in the calf. Survey views of the contralateral common femoral vein are unremarkable. IMPRESSION: No evidence of LEFT lower extremity deep vein thrombosis. Electronically Signed   By: Lucrezia Europe [name]   On: 10/01/2017 12:48   ASSESSMENT AND PLAN:  [name] [name] is a 47 y.o. male has a past medical history significant for HTN and DM recently seen by [name] for LLE edema now with worsening pain and swelling of LLE with bloody discharge. Also c/o worsening hyperglycemia and nausea. No fever. Denies CP or SOB.   1.  Left lower extremity cellulitis with severe chronic venous stasis dermatitis and venous ulceration. -Appreciate Dr. Nino Parsley input -On empirically IV Zosyn and vancomycin -Blood cultures negative.  White count 17,000--- 13,000 -Wound consultation placed -Further workup per Dr. Delana Meyer -Patient had Unna boots that were removed in the ER  2.  Uncontrolled type 2 diabetes -Patient did not take insulin as he was supposed to at home.  He continued his metformin -We will give insulin twice a day and do meal coverage with insulin aspart and sliding scale -DC metformin given elevated creatinine  3.  Hypertension  -continue home meds  4.  Acute on chronic CKD stage III -Avoid nephrotoxins -Creatinine 2.22--- fluids--- 2.10 -Nephrology to see patient  5.  DVT prophylaxis subcu heparin  Case discussed with Care Management/Social Worker. Management plans discussed with the patient, family and they are in agreement.  CODE STATUS: Full  DVT Prophylaxis: Heparin  TOTAL TIME TAKING CARE OF THIS PATIENT: 30 minutes.  >50% time spent on counselling and coordination of care  POSSIBLE D/C IN 2-3 DAYS, DEPENDING  ON CLINICAL CONDITION.  Note: This dictation was prepared with Dragon dictation along with smaller phrase technology. Any transcriptional errors that result from this process are unintentional.  [name] on 10/02/2017 at 10:52 AM  Between 7am to 6pm - Pager - [phone]  After 6pm go to [email or web address] - password [email or web address] [facility]  Office  [phone]  CC: Primary care physician; Sallee Lange, NPPatient ID: Vivi Ferns, male   DOB: 06-Oct-1970, 47 y.o.   MRN: [id number]

## 2017-10-02 NOTE — Progress Notes (Addendum)
[facility]  Daily Progress Note   Subjective  - * No surgery found *  Patient states the pain in his left leg is better but not gone he denies shaking chills night sweats or feeling febrile  Objective Vitals:   10/01/17 2358 10/02/17 0426 10/02/17 0500 10/02/17 0717  BP: 132/68 (!) 148/73  (!) 151/78  Pulse: 72 75  75  Resp: 19 19  19   Temp: 99.4 F (37.4 C) 99.2 F (37.3 C)  98.9 F (37.2 C)  TempSrc: Oral Oral  Oral  SpO2: 95% 96%  98%  Weight:   (!) 351 lb 9.6 oz (159.5 kg)   Height:        Intake/Output Summary (Last 24 hours) at 10/02/2017 1542 Last data filed at 10/02/2017 1517 Gross per 24 hour  Intake 3776.66 ml  Output 1200 ml  Net 2576.66 ml    PULM  Normal effort , no use of accessory muscles CV  No JVD, RRR Abd      No distended, nontender VASC  left leg is much less edematous there are no wrinkles of the skin from the need to the forefoot.  Inspection of the posterior ankle demonstrates an area approximately 6 cm x 6 cm of necrotic tissue with an area of purulent drainage.  Laboratory CBC    Component Value Date/Time   WBC 13.3 (H) 10/02/2017 0439   HGB 11.0 (L) 10/02/2017 0439   HCT 34.3 (L) 10/02/2017 0439   PLT 281 10/02/2017 0439    BMET    Component Value Date/Time   NA 133 (L) 10/02/2017 0439   K 3.8 10/02/2017 0439   CL 102 10/02/2017 0439   CO2 23 10/02/2017 0439   GLUCOSE 386 (H) 10/02/2017 0439   BUN 56 (H) 10/02/2017 0439   CREATININE 2.10 (H) 10/02/2017 0439   CALCIUM 8.3 (L) 10/02/2017 0439   GFRNONAA 36 (L) 10/02/2017 0439   GFRAA 42 (L) 10/02/2017 0439    Assessment/Planning:  1.  Cellulitis with venous stasis dermatitis and venous ulcerations left lower extremity: Examination today shows a area of liquefied tissue with purulent drainage.  This will require definitive drainage and debridement in the operating room.  I will plan this for tomorrow and will put him on the OR schedule.  VAC wound will be  placed at the time of surgery.  Continue antibiotics for now.  I will make him n.p.o. after midnight. 2.  Diabetes mellitus: Currently the patient is not under good glycemic control.  This will be corrected.  Diabetic diet as prescribed. 3.  Hypertension: Patient will be continued on his outpatient therapy vital signs will be used to monitor his blood pressure.    [name]  10/02/2017, 3:42 PM

## 2017-10-03 ENCOUNTER — Encounter (INDEPENDENT_AMBULATORY_CARE_PROVIDER_SITE_OTHER): Payer: BLUE CROSS/BLUE SHIELD | Admitting: Vascular Surgery

## 2017-10-03 ENCOUNTER — Encounter
Admission: EM | Disposition: A | Discharge status: Skilled Nursing Facility | Payer: Self-pay | Source: Home / Self Care | Attending: Internal Medicine

## 2017-10-03 ENCOUNTER — Inpatient Hospital Stay: Payer: BLUE CROSS/BLUE SHIELD | Admitting: Certified Registered"

## 2017-10-03 ENCOUNTER — Encounter: Payer: Self-pay | Admitting: Anesthesiology

## 2017-10-03 DIAGNOSIS — E11621 Type 2 diabetes mellitus with foot ulcer: Secondary | ICD-10-CM

## 2017-10-03 DIAGNOSIS — L03116 Cellulitis of left lower limb: Secondary | ICD-10-CM

## 2017-10-03 HISTORY — PX: IRRIGATION AND DEBRIDEMENT ABSCESS: SHX5252

## 2017-10-03 HISTORY — PX: APPLICATION OF WOUND VAC: SHX5189

## 2017-10-03 LAB — RENAL FUNCTION PANEL
ALBUMIN: 1.7 g/dL — AB (ref 3.5–5.0)
ANION GAP: 11 (ref 5–15)
BUN: 53 mg/dL — ABNORMAL HIGH (ref 6–20)
CALCIUM: 8.4 mg/dL — AB (ref 8.9–10.3)
CO2: 19 mmol/L — ABNORMAL LOW (ref 22–32)
Chloride: 106 mmol/L (ref 101–111)
Creatinine, Ser: 2.32 mg/dL — ABNORMAL HIGH (ref 0.61–1.24)
GFR calc non Af Amer: 32 mL/min — ABNORMAL LOW (ref >60)
GFR, EST AFRICAN AMERICAN: 37 mL/min — AB (ref >60)
GLUCOSE: 253 mg/dL — AB (ref 65–99)
PHOSPHORUS: 3.7 mg/dL (ref 2.5–4.6)
Potassium: 3.7 mmol/L (ref 3.5–5.1)
SODIUM: 136 mmol/L (ref 135–145)

## 2017-10-03 LAB — GLUCOSE, CAPILLARY
GLUCOSE-CAPILLARY: 189 mg/dL — AB (ref 65–99)
Glucose-Capillary: 191 mg/dL — ABNORMAL HIGH (ref 65–99)
Glucose-Capillary: 245 mg/dL — ABNORMAL HIGH (ref 65–99)
Glucose-Capillary: 200 mg/dL — ABNORMAL HIGH (ref 65–99)
Glucose-Capillary: 179 mg/dL — ABNORMAL HIGH (ref 65–99)
Glucose-Capillary: 200 mg/dL — ABNORMAL HIGH (ref 65–99)
Glucose-Capillary: 193 mg/dL — ABNORMAL HIGH (ref 65–99)

## 2017-10-03 LAB — URINALYSIS, ROUTINE W REFLEX MICROSCOPIC
Bilirubin Urine: NEGATIVE
GLUCOSE, UA: NEGATIVE mg/dL
KETONES UR: NEGATIVE mg/dL
Leukocytes, UA: NEGATIVE
Nitrite: NEGATIVE
PROTEIN: 30 mg/dL — AB
Specific Gravity, Urine: 1.015 (ref 1.005–1.030)
pH: 5 (ref 5.0–8.0)

## 2017-10-03 LAB — VANCOMYCIN, TROUGH: Vancomycin Tr: 36 ug/mL (ref 15–20)

## 2017-10-03 LAB — HIV ANTIBODY (ROUTINE TESTING W REFLEX): HIV SCREEN 4TH GENERATION: NONREACTIVE

## 2017-10-03 LAB — PROTEIN / CREATININE RATIO, URINE
Creatinine, Urine: 106 mg/dL
Protein Creatinine Ratio: 0.82 mg/mg{Cre} — ABNORMAL HIGH (ref 0.00–0.15)
Total Protein, Urine: 87 mg/dL

## 2017-10-03 LAB — MRSA PCR SCREENING: MRSA by PCR: NEGATIVE

## 2017-10-03 LAB — VANCOMYCIN, RANDOM: VANCOMYCIN RM: 24

## 2017-10-03 SURGERY — IRRIGATION AND DEBRIDEMENT ABSCESS
Anesthesia: General | Site: Leg Lower | Laterality: Left | Wound class: Dirty or Infected

## 2017-10-03 MED ORDER — PROPOFOL 10 MG/ML IV BOLUS
INTRAVENOUS | Status: AC
  Filled 2017-10-03: qty 20

## 2017-10-03 MED ORDER — LIDOCAINE HCL (PF) 2 % IJ SOLN
INTRAMUSCULAR | Status: AC
  Filled 2017-10-03: qty 10

## 2017-10-03 MED ORDER — INSULIN GLARGINE 100 UNIT/ML ~~LOC~~ SOLN
17.0000 [IU] | Freq: Two times a day (BID) | SUBCUTANEOUS | Status: DC
  Administered 2017-10-03 – 2017-10-19 (×27): 17 [IU] via SUBCUTANEOUS
  Filled 2017-10-03 (×34): qty 0.17

## 2017-10-03 MED ORDER — ONDANSETRON HCL 4 MG/2ML IJ SOLN
INTRAMUSCULAR | Status: AC
  Filled 2017-10-03: qty 2

## 2017-10-03 MED ORDER — OXYCODONE HCL 5 MG PO TABS: 5.0000 mg | ORAL_TABLET | Freq: Once | ORAL | Status: DC | PRN

## 2017-10-03 MED ORDER — MIDAZOLAM HCL 2 MG/2ML IJ SOLN
INTRAMUSCULAR | Status: AC
  Filled 2017-10-03: qty 2

## 2017-10-03 MED ORDER — FENTANYL CITRATE (PF) 100 MCG/2ML IJ SOLN
INTRAMUSCULAR | Status: DC | PRN
  Administered 2017-10-03 (×2): 50 ug via INTRAVENOUS
  Administered 2017-10-03: 100 ug via INTRAVENOUS

## 2017-10-03 MED ORDER — MEPERIDINE HCL 50 MG/ML IJ SOLN: 6.2500 mg | INTRAMUSCULAR | Status: DC | PRN

## 2017-10-03 MED ORDER — FAMOTIDINE 20 MG PO TABS
ORAL_TABLET | ORAL | Status: AC
  Filled 2017-10-03: qty 1

## 2017-10-03 MED ORDER — SODIUM CHLORIDE FLUSH 0.9 % IV SOLN
INTRAVENOUS | Status: AC
  Filled 2017-10-03: qty 10

## 2017-10-03 MED ORDER — SUCCINYLCHOLINE CHLORIDE 20 MG/ML IJ SOLN
INTRAMUSCULAR | Status: AC
  Filled 2017-10-03: qty 1

## 2017-10-03 MED ORDER — FENTANYL CITRATE (PF) 100 MCG/2ML IJ SOLN
INTRAMUSCULAR | Status: AC
  Filled 2017-10-03: qty 2

## 2017-10-03 MED ORDER — OXYCODONE HCL 5 MG/5ML PO SOLN: 5.0000 mg | Freq: Once | ORAL | Status: DC | PRN

## 2017-10-03 MED ORDER — SUCCINYLCHOLINE CHLORIDE 20 MG/ML IJ SOLN
INTRAMUSCULAR | Status: DC | PRN
  Administered 2017-10-03: 120 mg via INTRAVENOUS

## 2017-10-03 MED ORDER — MIDAZOLAM HCL 2 MG/2ML IJ SOLN
INTRAMUSCULAR | Status: DC | PRN
  Administered 2017-10-03: 2 mg via INTRAVENOUS

## 2017-10-03 MED ORDER — FENTANYL CITRATE (PF) 100 MCG/2ML IJ SOLN: 25.0000 ug | INTRAMUSCULAR | Status: DC | PRN

## 2017-10-03 MED ORDER — ONDANSETRON HCL 4 MG/2ML IJ SOLN
INTRAMUSCULAR | Status: DC | PRN
  Administered 2017-10-03: 4 mg via INTRAVENOUS

## 2017-10-03 MED ORDER — CEFAZOLIN SODIUM-DEXTROSE 1-4 GM/50ML-% IV SOLN
INTRAVENOUS | Status: AC
  Filled 2017-10-03: qty 50

## 2017-10-03 MED ORDER — CEFAZOLIN SODIUM-DEXTROSE 1-4 GM/50ML-% IV SOLN
1.0000 g | Freq: Once | INTRAVENOUS | Status: AC
  Administered 2017-10-03: 1 g via INTRAVENOUS

## 2017-10-03 MED ORDER — LIDOCAINE HCL (CARDIAC) 20 MG/ML IV SOLN
INTRAVENOUS | Status: DC | PRN
  Administered 2017-10-03: 100 mg via INTRAVENOUS

## 2017-10-03 MED ORDER — SODIUM CHLORIDE 0.9 % IR SOLN
Status: DC | PRN
  Administered 2017-10-03: 3000 mL

## 2017-10-03 MED ORDER — PROMETHAZINE HCL 25 MG/ML IJ SOLN: 6.2500 mg | INTRAMUSCULAR | Status: DC | PRN

## 2017-10-03 MED ORDER — PROPOFOL 10 MG/ML IV BOLUS
INTRAVENOUS | Status: DC | PRN
  Administered 2017-10-03: 50 mg via INTRAVENOUS
  Administered 2017-10-03: 200 mg via INTRAVENOUS

## 2017-10-03 SURGICAL SUPPLY — 29 items
BNDG COHESIVE 6X5 TAN STRL LF (GAUZE/BANDAGES/DRESSINGS) ×4 IMPLANT
CANISTER SUCT 1200ML W/VALVE (MISCELLANEOUS) ×4 IMPLANT
DRAPE INCISE IOBAN 66X45 STRL (DRAPES) ×4 IMPLANT
DRAPE LAPAROTOMY 100X77 ABD (DRAPES) ×4 IMPLANT
DRSG VAC ATS MED SENSATRAC (GAUZE/BANDAGES/DRESSINGS) ×4 IMPLANT
ELECT CAUTERY NEEDLE TIP 1.0 (MISCELLANEOUS) ×4
ELECT REM PT RETURN 9FT ADLT (ELECTROSURGICAL) ×4
ELECTRODE CAUTERY NEDL TIP 1.0 (MISCELLANEOUS) ×2 IMPLANT
ELECTRODE REM PT RTRN 9FT ADLT (ELECTROSURGICAL) ×2 IMPLANT
GAUZE SPONGE 4X4 12PLY STRL (GAUZE/BANDAGES/DRESSINGS) ×4 IMPLANT
GLOVE INDICATOR 7.5 STRL GRN (GLOVE) ×4 IMPLANT
GLOVE SURG LX 7.5 STRW (GLOVE) ×2
GLOVE SURG LX STRL 7.5 STRW (GLOVE) ×2 IMPLANT
GOWN STRL REUS W/ TWL LRG LVL3 (GOWN DISPOSABLE) ×4 IMPLANT
GOWN STRL REUS W/TWL LRG LVL3 (GOWN DISPOSABLE) ×4
KIT TURNOVER KIT A (KITS) ×4 IMPLANT
LABEL OR SOLS (LABEL) ×4 IMPLANT
NS IRRIG 1000ML POUR BTL (IV SOLUTION) ×4 IMPLANT
PACK BASIN MAJOR ARMC (MISCELLANEOUS) ×4 IMPLANT
PAD ABD DERMACEA PRESS 5X9 (GAUZE/BANDAGES/DRESSINGS) ×4 IMPLANT
PULSAVAC PLUS IRRIG FAN TIP (DISPOSABLE) ×4
STAPLER SKIN PROX 35W (STAPLE) IMPLANT
SUT CHROMIC 0 CT 1 (SUTURE) ×4 IMPLANT
SUT CHROMIC BR 1/2CLE 2-0 54IN (SUTURE) ×4 IMPLANT
SUT PDS PLUS 0 (SUTURE) ×8
SUT PDS PLUS AB 0 CT-2 (SUTURE) ×8 IMPLANT
TIP FAN IRRIG PULSAVAC PLUS (DISPOSABLE) ×2 IMPLANT
TRAY FOLEY W/METER SILVER 16FR (SET/KITS/TRAYS/PACK) IMPLANT
WND VAC CANISTER 500ML (MISCELLANEOUS) ×4 IMPLANT

## 2017-10-03 NOTE — Anesthesia Preprocedure Evaluation (Signed)
Anesthesia Evaluation  Patient identified by MRN, date of birth, ID band Patient awake    Reviewed: Allergy & Precautions, NPO status , Patient's Chart, lab work & pertinent test results  History of Anesthesia Complications Negative for: history of anesthetic complications  Airway Mallampati: II  TM Distance: >3 FB Neck ROM: Full    Dental  (+) Partial Upper   Pulmonary neg pulmonary ROS, neg sleep apnea, neg COPD,    breath sounds clear to auscultation- rhonchi (-) wheezing      Cardiovascular hypertension, Pt. on medications + Peripheral Vascular Disease  (-) CAD, (-) Past MI, (-) Cardiac Stents and (-) CABG  Rhythm:Regular Rate:Normal - Systolic murmurs and - Diastolic murmurs    Neuro/Psych negative neurological ROS  negative psych ROS   GI/Hepatic negative GI ROS, Neg liver ROS,   Endo/Other  diabetes, Insulin Dependent  Renal/GU Renal InsufficiencyRenal disease     Musculoskeletal negative musculoskeletal ROS (+)   Abdominal (+) + obese,   Peds  Hematology negative hematology ROS (+)   Anesthesia Other Findings Past Medical History: No date: Diabetes mellitus without complication (HCC) No date: Hypertension No date: Microalbuminuria No date: Vitamin D deficiency   Reproductive/Obstetrics                             Anesthesia Physical Anesthesia Plan  ASA: III  Anesthesia Plan: General   Post-op Pain Management:    Induction: Intravenous  PONV Risk Score and Plan: 1 and Dexamethasone and Ondansetron  Airway Management Planned: Oral ETT  Additional Equipment:   Intra-op Plan:   Post-operative Plan: Extubation in OR  Informed Consent: I have reviewed the patients History and Physical, chart, labs and discussed the procedure including the risks, benefits and alternatives for the proposed anesthesia with the patient or authorized representative who has indicated  his/her understanding and acceptance.   Dental advisory given  Plan Discussed with: CRNA and Anesthesiologist  Anesthesia Plan Comments:         Anesthesia Quick Evaluation

## 2017-10-03 NOTE — Progress Notes (Signed)
[facility], [place] 10/03/17  Subjective:   Patient reports he is feeling the same and awaiting surgery for LLE cellulitis and edema with Dr. Delana Meyer. States he does have a periodic cough but agrees this might be due to extended time in bed.   Wife and mother at bedside.    Objective:  Vital signs in last 24 hours:  Temp:  [98.8 F (37.1 C)-99.7 F (37.6 C)] 98.8 F (37.1 C) (02/04 0806) Pulse Rate:  [72-83] 73 (02/04 0806) Resp:  [18-20] 18 (02/04 0806) BP: (139-146)/(53-83) 139/74 (02/04 0806) SpO2:  [94 %-100 %] 100 % (02/04 0806) Weight:  [183.2 kg (403 lb 12.8 oz)] 183.2 kg (403 lb 12.8 oz) (02/04 0604)  Weight change: 23.9 kg (52 lb 12.8 oz) Filed Weights   10/01/17 1038 10/02/17 0500 10/03/17 0604  Weight: (!) 159.2 kg (351 lb) (!) 159.5 kg (351 lb 9.6 oz) (!) 183.2 kg (403 lb 12.8 oz)    Intake/Output:    Intake/Output Summary (Last 24 hours) at 10/03/2017 1057 Last data filed at 10/03/2017 1013 Gross per 24 hour  Intake 1153.33 ml  Output 1175 ml  Net -21.67 ml     Physical Exam: General: Normocephalic and atraumatic.   HEENT Mucous membranes moist.   Neck Nontender.   Pulm/lungs Lungs clear to auscultation bilaterally. No wheezing.   CVS/Heart Regular rate and rhythm. No murmurs, rubs, gallops.   Abdomen:  Soft and nontender.   Extremities: LLE edema with erythema and warmth and wound at heel. Right lower extremity without edema.  Neurologic: AAOX3. Speech and affect normal.  Skin: LLE erythema.           Basic Metabolic Panel:  Recent Labs  Lab 10/01/17 1035 10/01/17 1333 10/02/17 0439 10/03/17 0130  NA 131*  --  133* 136  K 4.1  --  3.8 3.7  CL 97*  --  102 106  CO2 25  --  23 19*  GLUCOSE 477*  --  386* 253*  BUN 56*  --  56* 53*  CREATININE 2.13* 2.22* 2.10* 2.32*  CALCIUM 8.8*  --  8.3* 8.4*  PHOS  --   --   --  3.7     CBC: Recent Labs  Lab 10/01/17 1035 10/01/17 1333 10/02/17 0439  WBC 17.1*  17.0* 13.3*  HGB 12.0* 11.3* 11.0*  HCT 38.1* 35.3* 34.3*  MCV 89.0 88.2 89.1  PLT 365 312 281     No results found for: HEPBSAG, HEPBSAB, HEPBIGM    Microbiology:  Recent Results (from the past 240 hour(s))  Blood Culture (routine x 2)     Status: None (Preliminary result)   Collection Time: 10/01/17 10:41 AM  Result Value Ref Range Status   Specimen Description BLOOD LEFT ANTECUBITAL  Final   Special Requests   Final    BOTTLES DRAWN AEROBIC AND ANAEROBIC Blood Culture results may not be optimal due to an excessive volume of blood received in culture bottles   Culture   Final    NO GROWTH 2 DAYS Performed at [facility], [address] [place], [place] [address]    Report Status PENDING  Incomplete  Blood Culture (routine x 2)     Status: None (Preliminary result)   Collection Time: 10/01/17 11:31 AM  Result Value Ref Range Status   Specimen Description BLOOD BLOOD RIGHT ARM  Final   Special Requests   Final    BOTTLES DRAWN AEROBIC AND ANAEROBIC Blood Culture adequate volume  Culture   Final    NO GROWTH 2 DAYS Performed at [facility], [address] [place], [place] [address]    Report Status PENDING  Incomplete  MRSA PCR Screening     Status: None   Collection Time: 10/03/17  8:02 AM  Result Value Ref Range Status   MRSA by PCR NEGATIVE NEGATIVE Final    Comment:        The GeneXpert MRSA Assay (FDA approved for NASAL specimens only), is one component of a comprehensive MRSA colonization surveillance program. It is not intended to diagnose MRSA infection nor to guide or monitor treatment for MRSA infections. Performed at [facility], [address] [place], [place] [address]     Coagulation Studies: No results for input(s): LABPROT, INR in the last 72 hours.  Urinalysis: No results for input(s): COLORURINE, LABSPEC, PHURINE, GLUCOSEU, HGBUR, BILIRUBINUR, KETONESUR, PROTEINUR, UROBILINOGEN, NITRITE, LEUKOCYTESUR in  the last 72 hours.  Invalid input(s): APPERANCEUR    Imaging: US Renal  Result Date: 10/01/2017 CLINICAL DATA:  Acute kidney failure, chronic kidney disease stage 3. EXAM: RENAL / URINARY TRACT ULTRASOUND COMPLETE COMPARISON:  None. FINDINGS: Right Kidney: Length: 10.5 cm. Echogenicity within normal limits. No mass or hydronephrosis visualized. Left Kidney: Length: 11.7 cm. Echogenicity within normal limits. No mass or hydronephrosis visualized. Bladder: Appears normal for degree of bladder distention. IMPRESSION: Normal renal ultrasound. Electronically Signed   By: Marijo Conception, M.D.   On: 10/01/2017 19:29   US Venous Img Lower Unilateral Left  Result Date: 10/01/2017 CLINICAL DATA:  Left lower extremity swelling x4 weeks EXAM: LEFT LOWER EXTREMITY VENOUS DOPPLER ULTRASOUND TECHNIQUE: Gray-scale sonography with compression, as well as color and duplex ultrasound, were performed to evaluate the deep venous system from the level of the common femoral vein through the popliteal and proximal calf veins. COMPARISON:  04/06/2004 by report only FINDINGS: Normal compressibility of the common femoral, superficial femoral, and popliteal veins, as well as the proximal calf veins. No filling defects to suggest DVT on grayscale or color Doppler imaging. Doppler waveforms show normal direction of venous flow, normal respiratory phasicity and response to augmentation. Prominent left inguinal lymph node measuring 2 cm short axis diameter, possibly reactive but nonspecific. Subcutaneous edema in the calf. Survey views of the contralateral common femoral vein are unremarkable. IMPRESSION: No evidence of LEFT lower extremity deep vein thrombosis. Electronically Signed   By: Lucrezia Europe M.D.   On: 10/01/2017 12:48     Medications:   . sodium chloride 100 mL/hr at 10/03/17 0151  . piperacillin-tazobactam (ZOSYN)  IV Stopped (10/03/17 1013)   . amLODipine  10 mg Oral Daily  . atenolol  25 mg Oral Daily  . docusate  sodium  100 mg Oral BID  . enoxaparin (LOVENOX) injection  40 mg Subcutaneous BID  . Influenza vac split quadrivalent PF  0.5 mL Intramuscular Tomorrow-1000  . insulin aspart  0-15 Units Subcutaneous TID WC  . insulin glargine  15 Units Subcutaneous BID  . pneumococcal 23 valent vaccine  0.5 mL Intramuscular Tomorrow-1000   acetaminophen **OR** acetaminophen, bisacodyl, morphine injection, ondansetron **OR** ondansetron (ZOFRAN) IV  Assessment/ Plan:  47 y.o. black male with diabetes mellitus type II, diabetic retinopathy and neuropathy, hypertension, obesity, venous insufficiency, angioedema with ACE-I who was admitted to [facility] 10/01/2017 for left leg abscess and cellulitis.  1. Acute Renal Failure on Chronic Kidney Disease Stage III with Proteinuria - Baseline = Creatinine: 1.6; GFR= 58 (8/18) - CKD secondary to  Diabetic Nephropathy. A1C 15.1 (10/01/17) - Acute renal failure with active infection of cellulitis. Urinalysis and protein to creatinine ratio ordered. Normal renal ultrasound. On NS 100cc/hr.  -Creatinine slightly worse compared with yesterday 2.10 -->2.32  2. Hypertension. Currently taking amlodipine, atenolol and blood pressure stable. - Continue holding HCTZ.  3. DM2 with CKD and insulin dependent. -Hemoglobin A1C 15.1%  -Allergy to ACE-I -Hold metformin d/t ARF  4. LLE cellulitis. Empiric abx with vancomycin and pip/tazo.  -Surgical debridement today (10/03/17)   LOS: Elim 2/4/201910:57 [place], [place]

## 2017-10-03 NOTE — Anesthesia Procedure Notes (Signed)
Procedure Name: Intubation Date/Time: 10/03/2017 2:03 PM Performed by: Silvana Newness, CRNA Pre-anesthesia Checklist: Patient identified, Emergency Drugs available, Suction available, Patient being monitored and Timeout performed Patient Re-evaluated:Patient Re-evaluated prior to induction Oxygen Delivery Method: Circle system utilized Preoxygenation: Pre-oxygenation with 100% oxygen Induction Type: IV induction Ventilation: Mask ventilation without difficulty Laryngoscope Size: Mac and 4 Grade View: Grade II Tube type: Oral Tube size: 7.5 mm Number of attempts: 1 Airway Equipment and Method: Stylet Placement Confirmation: ETT inserted through vocal cords under direct vision,  positive ETCO2 and breath sounds checked- equal and bilateral Secured at: 23 cm Tube secured with: Tape Dental Injury: Teeth and Oropharynx as per pre-operative assessment

## 2017-10-03 NOTE — Anesthesia Post-op Follow-up Note (Signed)
Anesthesia QCDR form completed.        

## 2017-10-03 NOTE — Progress Notes (Addendum)
Pharmacy Antibiotic Note  [name] is a 47 y.o. male admitted on 10/01/2017 with cellulitis.  Pharmacy has been consulted for zosyn and vancomycin dosing.  Plan: Vancomycin 1500mg  IV every 12 hours.  Goal trough 10-15 mcg/mL. Zosyn 3.375g IV q8h (4 hour infusion).   02/04 @ 0130 VT 36 supratherapeutic drawn appropriately. Scr rising, will hold off vanc for now and will check a random level in 12 hours ~ patient expected to be around 18 mcg/mL in 12 hours. Will reassess regimen once level < 20 mcg/mL  02/04 1642 Vanc random 24. Level still elevated. Will order Vanc random for 2/5 0130 (24 hours after trough level).   02/05 0200 vanc level 18. Will give one time dose of 1500 mg and recheck random vanc with 2/6 AM labs.  Height: 6\' 4"  (193 cm) Weight: (!) 403 lb 12.8 oz (183.2 kg) IBW/kg (Calculated) : 86.8  Temp (24hrs), Avg:98.5 F (36.9 C), Min:97.4 F (36.3 C), Max:99 F (37.2 C)  Recent Labs  Lab 10/01/17 1035 10/01/17 1333 10/02/17 0439 10/03/17 0130 10/03/17 1642  WBC 17.1* 17.0* 13.3*  --   --   CREATININE 2.13* 2.22* 2.10* 2.32*  --   LATICACIDVEN 1.8 2.1*  --   --   --   VANCOTROUGH  --   --   --  36*  --   VANCORANDOM  --   --   --   --  24    Estimated Creatinine Clearance: 69.8 mL/min (A) (by C-G formula based on SCr of 2.32 mg/dL (H)).    Allergies  Allergen Reactions  . Lisinopril Swelling    Antimicrobials this admission: Anti-infectives (From admission, onward)   Start     Dose/Rate Route Frequency Ordered Stop   10/03/17 1330  ceFAZolin (ANCEF) IVPB 1 g/50 mL premix     1 g 100 mL/hr over 30 Minutes Intravenous  Once 10/03/17 1323 10/03/17 1450   10/03/17 1320  ceFAZolin (ANCEF) 1-4 GM/50ML-% IVPB    Comments:  Machia, Millissa   : cabinet override      10/03/17 1320 10/03/17 1420   10/01/17 1800  piperacillin-tazobactam (ZOSYN) IVPB 3.375 g     3.375 g 12.5 mL/hr over 240 Minutes Intravenous Every 8 hours 10/01/17 1133     10/01/17 1400   vancomycin (VANCOCIN) 1,500 mg in sodium chloride 0.9 % 500 mL IVPB  Status:  Discontinued     1,500 mg 250 mL/hr over 120 Minutes Intravenous Every 12 hours 10/01/17 1156 10/03/17 0203   10/01/17 1130  piperacillin-tazobactam (ZOSYN) IVPB 3.375 g     3.375 g 100 mL/hr over 30 Minutes Intravenous  Once 10/01/17 1120 10/01/17 1211   10/01/17 1130  vancomycin (VANCOCIN) IVPB 1000 mg/200 mL premix     1,000 mg 200 mL/hr over 60 Minutes Intravenous  Once 10/01/17 1120 10/01/17 1300      Microbiology results: Recent Results (from the past 240 hour(s))  Blood Culture (routine x 2)     Status: None (Preliminary result)   Collection Time: 10/01/17 10:41 AM  Result Value Ref Range Status   Specimen Description BLOOD LEFT ANTECUBITAL  Final   Special Requests   Final    BOTTLES DRAWN AEROBIC AND ANAEROBIC Blood Culture results may not be optimal due to an excessive volume of blood received in culture bottles   Culture   Final    NO GROWTH 2 DAYS Performed at [facility], [address], [place], [place] [address]  Report Status PENDING  Incomplete  Blood Culture (routine x 2)     Status: None (Preliminary result)   Collection Time: 10/01/17 11:31 AM  Result Value Ref Range Status   Specimen Description BLOOD BLOOD RIGHT ARM  Final   Special Requests   Final    BOTTLES DRAWN AEROBIC AND ANAEROBIC Blood Culture adequate volume   Culture   Final    NO GROWTH 2 DAYS Performed at [facility], [address] [place], [place] [address]    Report Status PENDING  Incomplete  MRSA PCR Screening     Status: None   Collection Time: 10/03/17  8:02 AM  Result Value Ref Range Status   MRSA by PCR NEGATIVE NEGATIVE Final    Comment:        The GeneXpert MRSA Assay (FDA approved for NASAL specimens only), is one component of a comprehensive MRSA colonization surveillance program. It is not intended to diagnose MRSA infection nor to guide or monitor treatment for MRSA  infections. Performed at [facility], [address], [place], [place] [address]    Thank you for allowing pharmacy to be a part of this patient's care.  Pernell Dupre, PharmD, BCPS Clinical Pharmacist 10/03/2017 8:25 PM

## 2017-10-03 NOTE — Progress Notes (Signed)
Pharmacy Antibiotic Note  Dallin Klay Sobotka is a 47 y.o. male admitted on 10/01/2017 with cellulitis.  Pharmacy has been consulted for zosyn and vancomycin dosing.  Plan: Vancomycin 1500mg  IV every 12 hours.  Goal trough 10-15 mcg/mL. Zosyn 3.375g IV q8h (4 hour infusion).   02/04 @ 0130 VT 36 supratherapeutic drawn appropriately. Scr rising, will hold off vanc for now and will check a random level in 12 hours ~ patient expected to be around 18 mcg/mL in 12 hours. Will reassess regimen once level < 20 mcg/mL  Height: 6\' 4"  (193 cm) Weight: (!) 351 lb 9.6 oz (159.5 kg) IBW/kg (Calculated) : 86.8  Temp (24hrs), Avg:99.2 F (37.3 C), Min:98.9 F (37.2 C), Max:99.7 F (37.6 C)  Recent Labs  Lab 10/01/17 1035 10/01/17 1333 10/02/17 0439 10/03/17 0130  WBC 17.1* 17.0* 13.3*  --   CREATININE 2.13* 2.22* 2.10* 2.32*  LATICACIDVEN 1.8 2.1*  --   --   VANCOTROUGH  --   --   --  36*    Estimated Creatinine Clearance: 64.5 mL/min (A) (by C-G formula based on SCr of 2.32 mg/dL (H)).    Allergies  Allergen Reactions  . Lisinopril Swelling    Antimicrobials this admission: Anti-infectives (From admission, onward)   Start     Dose/Rate Route Frequency Ordered Stop   10/01/17 1800  piperacillin-tazobactam (ZOSYN) IVPB 3.375 g     3.375 g 12.5 mL/hr over 240 Minutes Intravenous Every 8 hours 10/01/17 1133     10/01/17 1400  vancomycin (VANCOCIN) 1,500 mg in sodium chloride 0.9 % 500 mL IVPB  Status:  Discontinued     1,500 mg 250 mL/hr over 120 Minutes Intravenous Every 12 hours 10/01/17 1156 10/03/17 0203   10/01/17 1130  piperacillin-tazobactam (ZOSYN) IVPB 3.375 g     3.375 g 100 mL/hr over 30 Minutes Intravenous  Once 10/01/17 1120 10/01/17 1211   10/01/17 1130  vancomycin (VANCOCIN) IVPB 1000 mg/200 mL premix     1,000 mg 200 mL/hr over 60 Minutes Intravenous  Once 10/01/17 1120 10/01/17 1300      Microbiology results: Recent Results (from the past 240 hour(s))  Blood  Culture (routine x 2)     Status: None (Preliminary result)   Collection Time: 10/01/17 10:41 AM  Result Value Ref Range Status   Specimen Description BLOOD LEFT ANTECUBITAL  Final   Special Requests   Final    BOTTLES DRAWN AEROBIC AND ANAEROBIC Blood Culture results may not be optimal due to an excessive volume of blood received in culture bottles   Culture   Final    NO GROWTH < 24 HOURS Performed at [facility], [address] [place], [place] [address]    Report Status PENDING  Incomplete  Blood Culture (routine x 2)     Status: None (Preliminary result)   Collection Time: 10/01/17 11:31 AM  Result Value Ref Range Status   Specimen Description BLOOD BLOOD RIGHT ARM  Final   Special Requests   Final    BOTTLES DRAWN AEROBIC AND ANAEROBIC Blood Culture adequate volume   Culture   Final    NO GROWTH < 24 HOURS Performed at [facility], [address] [place], [place] [address]    Report Status PENDING  Incomplete   Thank you for allowing pharmacy to be a part of this patient's care.  Tobie Lords, PharmD, BCPS Clinical Pharmacist 10/03/2017

## 2017-10-03 NOTE — Op Note (Signed)
    OPERATIVE NOTE   PROCEDURE: 1. Irrigation and debridement of left leg wound and abscess including debridement of skin, soft tissue, and muscle over approximately 50 cm  PRE-OPERATIVE DIAGNOSIS: Nonviable tissue and infection/abscess of left lower leg  POST-OPERATIVE DIAGNOSIS: Same as above  SURGEON: Leotis Pain, MD  ASSISTANT(S): None  ANESTHESIA: General  ESTIMATED BLOOD LOSS: 50 cc  FINDING(S): None  SPECIMEN(S): None  INDICATIONS:   [name] is a 47 y.o. male who presents with purulent drainage from a leg wound on the back of the left leg.  His leg is massively swollen and cellulitic. He is taken to the OR to drain the abscess.  Risks and benefits are discussed.  DESCRIPTION: After obtaining full informed written consent, the patient was brought back to the operating room and placed supine upon the operating table.  The patient received IV antibiotics prior to induction.  After obtaining adequate anesthesia, the patient was prepped and draped in the standard fashion.  The wound was then opened and excisional debridement was performed to the skin, soft tissue, and muscle to remove all clearly non-viable tissue. There was already a baseball sized wound and this was opened slightly medially and laterally.  There was a copious amount of foul-smelling, purulent fluid that was evacuated.  There was also a significant amount of tunneling medially and laterally.  The tissue was taken back to bleeding tissue that appeared viable.  The debridement was performed with scissors and electrocautery and encompassed an area of approximately 50 cm2 because of the medial and lateral tunnel and the dead tissue within the wound.  The wound was irrigated copiously with 3 L of pulse lavage irrigation.  After all clearly non-viable tissue was removed, a whole medium VAC sponge was placed within the wound to try to evacuate the tunnel medially and laterally strips of [place] were placed and an  occlusive seal was obtained. The patient was then awakened from anesthesia and taken to the recovery room in stable condition having tolerated the procedure well.  COMPLICATIONS: none  CONDITION: stable  Leotis Pain  10/03/2017, 3:06 PM   This note was created with Dragon Medical transcription system. Any errors in dictation are purely unintentional.

## 2017-10-03 NOTE — H&P (Signed)
[facility] History & Physical Update  The patient was interviewed and re-examined.  The patient's previous History and Physical has been reviewed and is unchanged.  There is no change in the plan of care. We plan to proceed with the scheduled procedure.  Leotis Pain, MD  10/03/2017, 1:30 PM

## 2017-10-03 NOTE — Progress Notes (Addendum)
Inpatient Diabetes Program Recommendations  AACE/ADA: New Consensus Statement on Inpatient Glycemic Control (2015)  Target Ranges:  Prepandial:   less than 140 mg/dL      Peak postprandial:   less than 180 mg/dL (1-2 hours)      Critically ill patients:  140 - 180 mg/dL   Lab Results  Component Value Date   GLUCAP 200 (H) 10/03/2017   HGBA1C 15.1 (H) 10/01/2017    Review of Glycemic ControlResults for DEMONTE, DOBRATZ (MRN [id number]) as of 10/03/2017 12:28  Ref. Range 10/02/2017 16:11 10/02/2017 21:06 10/03/2017 06:06 10/03/2017 07:40 10/03/2017 12:03  Glucose-Capillary Latest Ref Range: 65 - 99 mg/dL 232 (H) 187 (H) 191 (H) 245 (H) 200 (H)    Diabetes history: Type 2 DM- See's Dr. Eddie Dibbles endocrinologist Outpatient Diabetes medications:  Jardiance 25 mg daily, Lantus 20 units daily (PRN?), Metformin 1000 mg bid Current orders for Inpatient glycemic control:  Lantus 15 units bid, Novolog moderate tid with meals  Inpatient Diabetes Program Recommendations:    A1C markedly increased.  Last visit with endocrinologist was in August, 2018.  Blood sugars >goal.  Patient currently NPO for surgery today.  Consider increasing frequency of Novolog correction to q 4 hours while NPO.  Will see patient 2/5 regarding A1C.   Thanks, Adah Perl, RN, BC-ADM Inpatient Diabetes Coordinator Pager [phone] (8a-5p)

## 2017-10-03 NOTE — Transfer of Care (Signed)
Immediate Anesthesia Transfer of Care Note  Patient: [name]  Procedure(s) Performed: IRRIGATION AND [place] with debridement of skin, soft tissue, muscle 50sq cm (Left Leg Lower) APPLICATION OF WOUND VAC (Left Leg Lower)  Patient Location: PACU  Anesthesia Type:General  Level of Consciousness: drowsy and patient cooperative  Airway & Oxygen Therapy: Patient Spontanous Breathing and Patient connected to face mask oxygen  Post-op Assessment: Report given to RN and Post -op Vital signs reviewed and stable  Post vital signs: Reviewed and stable  Last Vitals:  Vitals:   10/03/17 0806 10/03/17 1317  BP: 139/74 134/87  Pulse: 73 68  Resp: 18 18  Temp: 37.1 C 37.2 C  SpO2: 100% 98%    Last Pain:  Vitals:   10/03/17 1317  TempSrc: Tympanic  PainSc: 6          Complications: No apparent anesthesia complications

## 2017-10-03 NOTE — Progress Notes (Signed)
[facility] NAME: Hughey Rittenberry    MR#:  [id number]  DATE OF BIRTH:  [birth year]  SUBJECTIVE:  Came in with increasing redness and excessive drainage from the left leg. Left leg pain REVIEW OF SYSTEMS:   Review of Systems  Constitutional: Negative for chills, fever and weight loss.  HENT: Negative for ear discharge, ear pain and nosebleeds.   Eyes: Negative for blurred vision, pain and discharge.  Respiratory: Negative for sputum production, shortness of breath, wheezing and stridor.   Cardiovascular: Negative for chest pain, palpitations, orthopnea and PND.  Gastrointestinal: Negative for abdominal pain, diarrhea, nausea and vomiting.  Genitourinary: Negative for frequency and urgency.  Musculoskeletal: Negative for back pain and joint pain.  Neurological: Positive for weakness. Negative for sensory change, speech change and focal weakness.  Psychiatric/Behavioral: Negative for depression and hallucinations. The patient is not nervous/anxious.    Tolerating Diet:yes Tolerating PT: pending  DRUG ALLERGIES:   Allergies  Allergen Reactions  . Lisinopril Swelling    VITALS:  Blood pressure 139/74, pulse 73, temperature 98.8 F (37.1 C), temperature source Oral, resp. rate 18, height 6\' 4"  (1.93 m), weight (!) 183.2 kg (403 lb 12.8 oz), SpO2 100 %.  PHYSICAL EXAMINATION:   Physical Exam  GENERAL:  47 y.o.-year-old patient lying in the bed with no acute distress.  EYES: Pupils equal, round, reactive to light and accommodation. No scleral icterus. Extraocular muscles intact.  HEENT: Head atraumatic, normocephalic. Oropharynx and nasopharynx clear.  NECK:  Supple, no jugular venous distention. No thyroid enlargement, no tenderness.  LUNGS: Normal breath sounds bilaterally, no wheezing, rales, rhonchi. No use of accessory muscles of respiration.  CARDIOVASCULAR: S1, S2 normal. No murmurs, rubs, or gallops.  ABDOMEN: Soft,  nontender, nondistended. Bowel sounds present. No organomegaly or mass.  EXTREMITIES: Left leg edema with posterior ankle 6 x 6 cm necrotic tissue with area of purulence and drainage.  cellulitis present. NEUROLOGIC: Cranial nerves II through XII are intact. No focal Motor or sensory deficits b/l.   PSYCHIATRIC:  patient is alert and oriented x 3.  SKIN: No obvious rash, lesion, or ulcer.   LABORATORY PANEL:  CBC Recent Labs  Lab 10/02/17 0439  WBC 13.3*  HGB 11.0*  HCT 34.3*  PLT 281    Chemistries  Recent Labs  Lab 10/02/17 0439 10/03/17 0130  NA 133* 136  K 3.8 3.7  CL 102 106  CO2 23 19*  GLUCOSE 386* 253*  BUN 56* 53*  CREATININE 2.10* 2.32*  CALCIUM 8.3* 8.4*  AST 18  --   ALT 15*  --   ALKPHOS 112  --   BILITOT 0.9  --    Cardiac Enzymes No results for input(s): TROPONINI in the last 168 hours. RADIOLOGY:  US Renal  Result Date: 10/01/2017 CLINICAL DATA:  Acute kidney failure, chronic kidney disease stage 3. EXAM: RENAL / URINARY TRACT ULTRASOUND COMPLETE COMPARISON:  None. FINDINGS: Right Kidney: Length: 10.5 cm. Echogenicity within normal limits. No mass or hydronephrosis visualized. Left Kidney: Length: 11.7 cm. Echogenicity within normal limits. No mass or hydronephrosis visualized. Bladder: Appears normal for degree of bladder distention. IMPRESSION: Normal renal ultrasound. Electronically Signed   By: Marijo Conception, M.D.   On: 10/01/2017 19:29   US Venous Img Lower Unilateral Left  Result Date: 10/01/2017 CLINICAL DATA:  Left lower extremity swelling x4 weeks EXAM: LEFT LOWER EXTREMITY VENOUS DOPPLER ULTRASOUND TECHNIQUE: Gray-scale sonography with compression, as well as color  and duplex ultrasound, were performed to evaluate the deep venous system from the level of the common femoral vein through the popliteal and proximal calf veins. COMPARISON:  04/06/2004 by report only FINDINGS: Normal compressibility of the common femoral, superficial femoral, and  popliteal veins, as well as the proximal calf veins. No filling defects to suggest DVT on grayscale or color Doppler imaging. Doppler waveforms show normal direction of venous flow, normal respiratory phasicity and response to augmentation. Prominent left inguinal lymph node measuring 2 cm short axis diameter, possibly reactive but nonspecific. Subcutaneous edema in the calf. Survey views of the contralateral common femoral vein are unremarkable. IMPRESSION: No evidence of LEFT lower extremity deep vein thrombosis. Electronically Signed   By: Lucrezia Europe M.D.   On: 10/01/2017 12:48   ASSESSMENT AND PLAN:  Yves Viggo Perko is a 47 y.o. male has a past medical history significant for HTN and DM recently seen by [name] for LLE edema now with worsening pain and swelling of LLE with bloody discharge. Also c/o worsening hyperglycemia and nausea. No fever. Denies CP or SOB.   1.  Left lower extremity cellulitis/necrotic area with severe chronic venous stasis dermatitis and venous ulceration. -Appreciate Dr. Nino Parsley input -On empirically IV Zosyn  -Blood cultures negative.  White count 17,000--- 13,000 -Wound consultation placed -Further workup per Dr. Holley Bouche for debridement of the necrotic area on the left leg today -Patient had Unna boots that were removed in the ER  2.  Uncontrolled type 2 diabetes -Patient did not take insulin as he was supposed to at home.  He continued his metformin -We will give insulin twice a day and do meal coverage with insulin aspart and sliding scale -DC metformin given elevated creatinine  3.  Hypertension  -continue home meds  4.  Acute on chronic CKD stage III -Avoid nephrotoxins -Creatinine 2.22--- fluids--- 2.10 -Nephrology input appreciated  5.  DVT prophylaxis subcu heparin  Case discussed with Care Management/Social Worker. Management plans discussed with the patient, family and they are in agreement.  CODE STATUS: Full  DVT Prophylaxis:  Heparin  TOTAL TIME TAKING CARE OF THIS PATIENT: 30 minutes.  >50% time spent on counselling and coordination of care  POSSIBLE D/C IN 2-3 DAYS, DEPENDING ON CLINICAL CONDITION.  Note: This dictation was prepared with Dragon dictation along with smaller phrase technology. Any transcriptional errors that result from this process are unintentional.  Fritzi Mandes M.D on 10/03/2017 at 8:46 AM  Between 7am to 6pm - Pager - [phone]  After 6pm go to [email or web address] - password [email or web address] [facility]  Office  [phone]  CC: Primary care physician; Sallee Lange, NPPatient ID: Vivi Ferns, male   DOB: May 26, 1971, 47 y.o.   MRN: [id number]

## 2017-10-03 NOTE — Consult Note (Signed)
[facility] Nurse wound consult note Reason for Consult:Vascular consult has been placed with orders that they will I & D in operative setting and apply NPWT,.   Will defer to vascular services.  Pleas reconsult if needed.  Wound type: Chronic venous ulcer with resulting cellulitis Pressure Injury POA: NA Wound bed: devitalized tissue Drainage (amount, consistency, odor) minimal purulent  Necrotic odor Periwound:erythema, edema Dressing procedure/placement/frequency: Defer to vascular surgery Will not follow at this time.  Please re-consult if needed.  Domenic Moras RN BSN [place] Pager [phone]

## 2017-10-04 ENCOUNTER — Encounter: Payer: Self-pay | Admitting: Vascular Surgery

## 2017-10-04 ENCOUNTER — Encounter (INDEPENDENT_AMBULATORY_CARE_PROVIDER_SITE_OTHER): Payer: BLUE CROSS/BLUE SHIELD

## 2017-10-04 LAB — GLUCOSE, CAPILLARY
GLUCOSE-CAPILLARY: 139 mg/dL — AB (ref 65–99)
GLUCOSE-CAPILLARY: 189 mg/dL — AB (ref 65–99)
Glucose-Capillary: 183 mg/dL — ABNORMAL HIGH (ref 65–99)
Glucose-Capillary: 221 mg/dL — ABNORMAL HIGH (ref 65–99)

## 2017-10-04 LAB — VANCOMYCIN, RANDOM: Vancomycin Rm: 18

## 2017-10-04 MED ORDER — VANCOMYCIN HCL 10 G IV SOLR
1500.0000 mg | Freq: Once | INTRAVENOUS | Status: AC
  Administered 2017-10-04: 1500 mg via INTRAVENOUS
  Filled 2017-10-04: qty 1500

## 2017-10-04 MED ORDER — OXYCODONE-ACETAMINOPHEN 5-325 MG PO TABS
1.0000 | ORAL_TABLET | Freq: Four times a day (QID) | ORAL | Status: DC | PRN
  Administered 2017-10-04 – 2017-10-08 (×5): 2 via ORAL
  Administered 2017-10-08: 1 via ORAL
  Administered 2017-10-09 – 2017-10-10 (×2): 2 via ORAL
  Administered 2017-10-11: 1 via ORAL
  Administered 2017-10-11 – 2017-10-17 (×4): 2 via ORAL
  Filled 2017-10-04 (×6): qty 2
  Filled 2017-10-04: qty 1
  Filled 2017-10-04 (×8): qty 2

## 2017-10-04 NOTE — Progress Notes (Signed)
Chaplain delivered , educated and complete a A.D. Chaplain also prayed with PT   10/04/17 1500  Clinical Encounter Type  Visited With Patient and family together  Visit Type Initial;Spiritual support  Referral From Nurse  Spiritual Encounters  Spiritual Needs Prayer;Emotional

## 2017-10-04 NOTE — Anesthesia Postprocedure Evaluation (Signed)
Anesthesia Post Note  Patient: [name]  Procedure(s) Performed: IRRIGATION AND DEBRIDEMENT ABSCESS with debridement of skin, soft tissue, muscle 50sq cm (Left Leg Lower) APPLICATION OF WOUND VAC (Left Leg Lower)  Patient location during evaluation: PACU Anesthesia Type: General Level of consciousness: awake and alert and oriented Pain management: pain level controlled Vital Signs Assessment: post-procedure vital signs reviewed and stable Respiratory status: spontaneous breathing, nonlabored ventilation and respiratory function stable Cardiovascular status: blood pressure returned to baseline and stable Postop Assessment: no signs of nausea or vomiting Anesthetic complications: no     Last Vitals:  Vitals:   10/04/17 0012 10/04/17 0428  BP: 127/64 (!) 142/71  Pulse: 72 69  Resp: 16 18  Temp: 36.9 C 36.9 C  SpO2: 97% 97%    Last Pain:  Vitals:   10/04/17 0621  TempSrc:   PainSc: 8                  Lavi Sheehan

## 2017-10-04 NOTE — Clinical Social Work Placement (Signed)
   CLINICAL SOCIAL WORK PLACEMENT  NOTE  Date:  10/04/2017  Patient Details  Name: Tobie Rivers Hamrick MRN: [id number] Date of Birth: August 09, 1971  Clinical Social Work is seeking post-discharge placement for this patient at the [place] level of care (*CSW will initial, date and re-position this form in  chart as items are completed):  Yes   Patient/family provided with [facility] Work Department's list of facilities offering this level of care within the geographic area requested by the patient (or if unable, by the patient's family).  Yes   Patient/family informed of their freedom to choose among providers that offer the needed level of care, that participate in Medicare, Medicaid or managed care program needed by the patient, have an available bed and are willing to accept the patient.  Yes   Patient/family informed of [facility]'s ownership interest in [facility] and [facility], as well as of the fact that they are under no obligation to receive care at these facilities.  PASRR submitted to EDS on 10/04/17     PASRR number received on 10/04/17     Existing PASRR number confirmed on       FL2 transmitted to all facilities in geographic area requested by pt/family on 10/04/17     FL2 transmitted to all facilities within larger geographic area on       Patient informed that his/her managed care company has contracts with or will negotiate with certain facilities, including the following:        Yes   Patient/family informed of bed offers received.  Patient chooses bed at [facility] )     Physician recommends and patient chooses bed at      Patient to be transferred to   on  .  Patient to be transferred to facility by       Patient family notified on   of transfer.  Name of family member notified:        PHYSICIAN       Additional Comment:    _______________________________________________ Mikailah Morel, Veronia Beets,  LCSW 10/04/2017, 3:02 PM

## 2017-10-04 NOTE — Evaluation (Signed)
Physical Therapy Evaluation Patient Details Name: [name] MRN: [id number] DOB: 1971-06-18 Today's Date: 10/04/2017   History of Present Illness  Pt is a 47 y.o.malehas a past medical history significant forHTN and DM recently seen by [name] for LLE edema now with worsening pain and swelling of LLE with bloody discharge. Also c/o worsening hyperglycemia and nausea. No fever. Denies CP or SOB. No vomiting or diarrhea. In ER, WBC elevated and copius amounts fo bloody drainage oozing from LLE. Sugar >400. He is now admitted.  Assessment includes: LLE cellulitis/necrotic area with severe chronic venous stasis dermatitis and venous ulceration s/p LLE I&D with wound vac, uncontrolled DM II, HTN, and acute on chronic CKD III.    Clinical Impression  Pt presents with deficits in strength, transfers, mobility, gait, balance, and activity tolerance.  Pt required extra time and effort with bed mobility and with sit to/from stand transfers from an elevated surface.  Pt antalgic and weak on LLE while in standing with difficulty bearing full weight through LLE.  Pt able to take only 2-3 very small steps with heavy reliance on BUEs through RW with very short, shuffling steps with the RLE.  Pt lives alone with limited available assistance in a home with 4 steps to enter making discharge to home unsafe at this time.  Pt will benefit from PT services in a SNF setting upon discharge to safely address above deficits for decreased caregiver assistance and eventual return to PLOF.     Follow Up Recommendations SNF    Equipment Recommendations  Other (comment)(Bariatric RW if does not d/c to SNF)    Recommendations for Other Services       Precautions / Restrictions Precautions Precautions: None Restrictions Weight Bearing Restrictions: No      Mobility  Bed Mobility Overal bed mobility: Modified Independent             General bed mobility comments: Extra time and effort during bed  mobility tasks but no physical assistance required  Transfers Overall transfer level: Needs assistance Equipment used: Rolling walker (2 wheeled) Transfers: Sit to/from Stand Sit to Stand: From elevated surface;Min guard         General transfer comment: Effortful during sit to/from stand but no physical assistance required  Ambulation/Gait Ambulation/Gait assistance: Min guard Ambulation Distance (Feet): 2 Feet Assistive device: Rolling walker (2 wheeled) Gait Pattern/deviations: Step-to pattern;Antalgic;Decreased stance time - left   Gait velocity interpretation: Below normal speed for age/gender General Gait Details: Pt presented with difficulty with LLE SLS time during amb with very short RLE step length   Stairs Stairs: (Deferred)          Wheelchair Mobility    Modified Rankin (Stroke Patients Only)       Balance Overall balance assessment: Needs assistance   Sitting balance-Leahy Scale: Normal     Standing balance support: Bilateral upper extremity supported Standing balance-Leahy Scale: Fair Standing balance comment: Heavy reliance on BUE support on RW                             Pertinent Vitals/Pain Pain Assessment: 0-10 Pain Score: 5  Pain Location: L leg Pain Descriptors / Indicators: Operative site guarding;Aching;Sore Pain Intervention(s): Premedicated before session;Monitored during session;Limited activity within patient's tolerance    Home Living Family/patient expects to be discharged to:: Private residence Living Arrangements: Alone Available Help at Discharge: Family;Available PRN/intermittently Type of Home: House Home Access: Stairs to enter  Entrance Stairs-Rails: Left Entrance Stairs-Number of Steps: 4 Home Layout: Two level;Able to live on main level with bedroom/bathroom Home Equipment: None      Prior Function Level of Independence: Independent         Comments: Ind amb community distances without AD, no  fall history, 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, Ind with all ADLs/IADLs     Hand Dominance   Dominant Hand: Left    Extremity/Trunk Assessment   Upper Extremity Assessment Upper Extremity Assessment: Overall WFL for tasks assessed    Lower Extremity Assessment Lower Extremity Assessment: Generalized weakness;LLE deficits/detail LLE: Unable to fully assess due to pain       Communication   Communication: No difficulties  Cognition Arousal/Alertness: Awake/alert Behavior During Therapy: WFL for tasks assessed/performed Overall Cognitive Status: Within Functional Limits for tasks assessed                                        General Comments      Exercises Total Joint Exercises Ankle Circles/Pumps: AROM;Both;10 reps Quad Sets: Strengthening;Both;10 reps Gluteal Sets: Strengthening;Both;10 reps Long Arc Quad: AROM;Both;10 reps Knee Flexion: AROM;Both;10 reps Marching in Standing: AROM;Left;5 reps Other Exercises Other Exercises: HEP education for BLE APs, GS, and QS x 10 each 5-6x/day   Assessment/Plan    PT Assessment Patient needs continued PT services  PT Problem List Decreased strength;Decreased activity tolerance;Decreased balance;Decreased mobility       PT Treatment Interventions DME instruction;Gait training;Stair training;Functional mobility training;Balance training;Therapeutic exercise;Therapeutic activities;Patient/family education    PT Goals (Current goals can be found in the Care Plan section)  Acute Rehab PT Goals Patient Stated Goal: To get stronger and walk better PT Goal Formulation: With patient Time For Goal Achievement: 10/17/17 Potential to Achieve Goals: Good    Frequency Min 2X/week   Barriers to discharge Inaccessible home environment;Decreased caregiver support      Co-evaluation               AM-PAC PT "6 Clicks" Daily Activity  Outcome Measure Difficulty turning over in bed (including adjusting  bedclothes, sheets and blankets)?: A Little Difficulty moving from lying on back to sitting on the side of the bed? : A Little Difficulty sitting down on and standing up from a chair with arms (e.g., wheelchair, bedside commode, etc,.)?: A Little Help needed moving to and from a bed to chair (including a wheelchair)?: A Little Help needed walking in hospital room?: A Lot Help needed climbing 3-5 steps with a railing? : A Lot 6 Click Score: 16    End of Session Equipment Utilized During Treatment: Gait belt Activity Tolerance: Patient tolerated treatment well Patient left: in bed;with call bell/phone within reach Nurse Communication: Mobility status PT Visit Diagnosis: Difficulty in walking, not elsewhere classified (R26.2);Muscle weakness (generalized) (M62.81)    Time: 1245-8099 PT Time Calculation (min) (ACUTE ONLY): 38 min   Charges:   PT Evaluation $PT Eval Low Complexity: 1 Low PT Treatments $Therapeutic Exercise: 8-22 mins   PT G Codes:        D. Scott Ayianna Darnold PT, DPT 10/04/17, 1:16 PM

## 2017-10-04 NOTE — Progress Notes (Signed)
[place], [place] 10/04/17  Subjective:   Patient had debridement in OR 10/03/17; Now has a wound vac Working with PT; able to walk with rolling walker Able to eat without nausea or vomiting No new S Creatinine today  Objective:  Vital signs in last 24 hours:  Temp:  [97.4 F (36.3 C)-99 F (37.2 C)] 98.4 F (36.9 C) (02/05 1230) Pulse Rate:  [60-72] 65 (02/05 1230) Resp:  [16-27] 18 (02/05 0428) BP: (120-147)/(63-87) 128/73 (02/05 1230) SpO2:  [96 %-100 %] 98 % (02/05 1230) Weight:  [184.3 kg (406 lb 3.2 oz)] 184.3 kg (406 lb 3.2 oz) (02/05 0428)  Weight change: 1.089 kg (2 lb 6.4 oz) Filed Weights   10/02/17 0500 10/03/17 0604 10/04/17 0428  Weight: (!) 159.5 kg (351 lb 9.6 oz) (!) 183.2 kg (403 lb 12.8 oz) (!) 184.3 kg (406 lb 3.2 oz)    Intake/Output:    Intake/Output Summary (Last 24 hours) at 10/04/2017 1253 Last data filed at 10/04/2017 1242 Gross per 24 hour  Intake 940 ml  Output 550 ml  Net 390 ml     Physical Exam: General: Normocephalic and atraumatic.   HEENT Mucous membranes moist.   Neck Nontender.   Pulm/lungs Lungs clear to auscultation bilaterally. No wheezing.   CVS/Heart Regular rate and rhythm. No murmurs, rubs, gallops.   Abdomen:  Soft and nontender.   Extremities: LLE edema with erythema; wound vac  Neurologic: AAOX3. Speech and affect normal.  Skin: LLE erythema. Wound on buttock          Basic Metabolic Panel:  Recent Labs  Lab 10/01/17 1035 10/01/17 1333 10/02/17 0439 10/03/17 0130  NA 131*  --  133* 136  K 4.1  --  3.8 3.7  CL 97*  --  102 106  CO2 25  --  23 19*  GLUCOSE 477*  --  386* 253*  BUN 56*  --  56* 53*  CREATININE 2.13* 2.22* 2.10* 2.32*  CALCIUM 8.8*  --  8.3* 8.4*  PHOS  --   --   --  3.7     CBC: Recent Labs  Lab 10/01/17 1035 10/01/17 1333 10/02/17 0439  WBC 17.1* 17.0* 13.3*  HGB 12.0* 11.3* 11.0*  HCT 38.1* 35.3* 34.3*  MCV 89.0 88.2 89.1  PLT 365 312 281     No  results found for: HEPBSAG, HEPBSAB, HEPBIGM    Microbiology:  Recent Results (from the past 240 hour(s))  Blood Culture (routine x 2)     Status: None (Preliminary result)   Collection Time: 10/01/17 10:41 AM  Result Value Ref Range Status   Specimen Description BLOOD LEFT ANTECUBITAL  Final   Special Requests   Final    BOTTLES DRAWN AEROBIC AND ANAEROBIC Blood Culture results may not be optimal due to an excessive volume of blood received in culture bottles   Culture   Final    NO GROWTH 3 DAYS Performed at [facility], [address] [place], [place] [address]    Report Status PENDING  Incomplete  Blood Culture (routine x 2)     Status: None (Preliminary result)   Collection Time: 10/01/17 11:31 AM  Result Value Ref Range Status   Specimen Description BLOOD BLOOD RIGHT ARM  Final   Special Requests   Final    BOTTLES DRAWN AEROBIC AND ANAEROBIC Blood Culture adequate volume   Culture   Final    NO GROWTH 3 DAYS Performed at [facility],  [place], [place] [address]    Report Status PENDING  Incomplete  MRSA PCR Screening     Status: None   Collection Time: 10/03/17  8:02 AM  Result Value Ref Range Status   MRSA by PCR NEGATIVE NEGATIVE Final    Comment:        The GeneXpert MRSA Assay (FDA approved for NASAL specimens only), is one component of a comprehensive MRSA colonization surveillance program. It is not intended to diagnose MRSA infection nor to guide or monitor treatment for MRSA infections. Performed at [facility], [address] [place], [place] [address]     Coagulation Studies: No results for input(s): LABPROT, INR in the last 72 hours.  Urinalysis: Recent Labs    10/03/17 1120  COLORURINE YELLOW*  LABSPEC 1.015  PHURINE 5.0  GLUCOSEU NEGATIVE  HGBUR SMALL*  BILIRUBINUR NEGATIVE  KETONESUR NEGATIVE  PROTEINUR 30*  NITRITE NEGATIVE  LEUKOCYTESUR NEGATIVE      Imaging: No results  found.   Medications:   . piperacillin-tazobactam (ZOSYN)  IV Stopped (10/04/17 1154)   . amLODipine  10 mg Oral Daily  . atenolol  25 mg Oral Daily  . docusate sodium  100 mg Oral BID  . enoxaparin (LOVENOX) injection  40 mg Subcutaneous BID  . Influenza vac split quadrivalent PF  0.5 mL Intramuscular Tomorrow-1000  . insulin aspart  0-15 Units Subcutaneous TID WC  . insulin glargine  17 Units Subcutaneous BID  . pneumococcal 23 valent vaccine  0.5 mL Intramuscular Tomorrow-1000   acetaminophen **OR** acetaminophen, bisacodyl, morphine injection, ondansetron **OR** ondansetron (ZOFRAN) IV, oxyCODONE-acetaminophen  Assessment/ Plan:  47 y.o. black male with diabetes mellitus type II, diabetic retinopathy and neuropathy, hypertension, obesity, venous insufficiency, angioedema with ACE-I who was admitted to [facility] 10/01/2017 for left leg abscess and cellulitis.  1. Acute Renal Failure on Chronic Kidney Disease Stage III with Proteinuria - Baseline = Creatinine: 1.6; GFR= 58 (8/18) - CKD secondary to Diabetic Nephropathy. A1C 15.1 (10/01/17) - Acute renal failure with active infection of cellulitis. Urinalysis and protein to creatinine ratio ordered. Normal renal ultrasound.  -Creatinine slightly worse compared with yesterday 2.10 -->2.32 - Oral intake is adequate, may d/c supplemental iv fluids  2. Hypertension. Currently taking amlodipine, atenolol and blood pressure stable. - Continue holding HCTZ.  3. DM2 with CKD and insulin dependent. -Hemoglobin A1C 15.1%  -Allergy to ACE-I -Hold metformin d/t ARF  4. LLE cellulitis.Abx as per IM team  -Surgical debridement (10/03/17)   LOS: [place] 2/5/201912:53 [place], [place]

## 2017-10-04 NOTE — Progress Notes (Signed)
Changed wound vac cartridge, total 500cc

## 2017-10-04 NOTE — Progress Notes (Signed)
Inpatient Diabetes Program Recommendations  AACE/ADA: New Consensus Statement on Inpatient Glycemic Control (2015)  Target Ranges:  Prepandial:   less than 140 mg/dL      Peak postprandial:   less than 180 mg/dL (1-2 hours)      Critically ill patients:  140 - 180 mg/dL   Lab Results  Component Value Date   GLUCAP 189 (H) 10/04/2017   HGBA1C 15.1 (H) 10/01/2017    Review of Glycemic ControlResults for GLADE, STRAUSSER (MRN [id number]) as of 10/04/2017 12:26  Ref. Range 10/03/2017 15:29 10/03/2017 16:41 10/03/2017 21:06 10/04/2017 07:46 10/04/2017 11:41  Glucose-Capillary Latest Ref Range: 65 - 99 mg/dL 189 (H) 200 (H) 193 (H) 139 (H) 189 (H)   Diabetes history: Type 2 DM- See's Dr. Eddie Dibbles endocrinologist Outpatient Diabetes medications:  Jardiance 25 mg daily, Lantus 20 units daily (PRN?), Metformin 1000 mg bid Current orders for Inpatient glycemic control:  Lantus 17 units bid, Novolog moderate tid with meals  Inpatient Diabetes Program Recommendations:   Blood sugars improved.  Briefly discussed with patient.  We discussed high A1C and how blood sugars are improved with Lantus/insulin.  He plans to follow-up with Dr. Eddie Dibbles, however will be d/c'd to SNF after d/c. Discussed importance of glycemic control for wound healing and the importance of keeping blood sugars controlled.  Patient verbalized understanding.    Thanks,  Adah Perl, RN, BC-ADM Inpatient Diabetes Coordinator Pager [phone] (8a-5p)

## 2017-10-04 NOTE — Progress Notes (Signed)
[place] NAME: [name]    MR#:  [id number]  DATE OF BIRTH:  1970/11/27  SUBJECTIVE:  Came in with increasing redness and excessive drainage from the left leg. Left leg pain REVIEW OF SYSTEMS:   Review of Systems  Constitutional: Negative for chills, fever and weight loss.  HENT: Negative for ear discharge, ear pain and nosebleeds.   Eyes: Negative for blurred vision, pain and discharge.  Respiratory: Negative for sputum production, shortness of breath, wheezing and stridor.   Cardiovascular: Negative for chest pain, palpitations, orthopnea and PND.  Gastrointestinal: Negative for abdominal pain, diarrhea, nausea and vomiting.  Genitourinary: Negative for frequency and urgency.  Musculoskeletal: Negative for back pain and joint pain.  Neurological: Positive for weakness. Negative for sensory change, speech change and focal weakness.  Psychiatric/Behavioral: Negative for depression and hallucinations. The patient is not nervous/anxious.    Tolerating Diet:yes Tolerating PT: pending  DRUG ALLERGIES:   Allergies  Allergen Reactions  . Lisinopril Swelling    VITALS:  Blood pressure (!) 142/71, pulse 69, temperature 98.4 F (36.9 C), temperature source Oral, resp. rate 18, height 6\' 4"  (1.93 m), weight (!) 184.3 kg (406 lb 3.2 oz), SpO2 97 %.  PHYSICAL EXAMINATION:   Physical Exam  GENERAL:  47 y.o.-year-old patient lying in the bed with no acute distress.  EYES: Pupils equal, round, reactive to light and accommodation. No scleral icterus. Extraocular muscles intact.  HEENT: Head atraumatic, normocephalic. Oropharynx and nasopharynx clear.  NECK:  Supple, no jugular venous distention. No thyroid enlargement, no tenderness.  LUNGS: Normal breath sounds bilaterally, no wheezing, rales, rhonchi. No use of accessory muscles of respiration.  CARDIOVASCULAR: S1, S2 normal. No murmurs, rubs, or gallops.  ABDOMEN:  Soft, nontender, nondistended. Bowel sounds present. No organomegaly or mass.  EXTREMITIES: Left leg edema with wound vac+ NEUROLOGIC: Cranial nerves II through XII are intact. No focal Motor or sensory deficits b/l.   PSYCHIATRIC:  patient is alert and oriented x 3.  SKIN: No obvious rash, lesion, or ulcer.   LABORATORY PANEL:  CBC Recent Labs  Lab 10/02/17 0439  WBC 13.3*  HGB 11.0*  HCT 34.3*  PLT 281    Chemistries  Recent Labs  Lab 10/02/17 0439 10/03/17 0130  NA 133* 136  K 3.8 3.7  CL 102 106  CO2 23 19*  GLUCOSE 386* 253*  BUN 56* 53*  CREATININE 2.10* 2.32*  CALCIUM 8.3* 8.4*  AST 18  --   ALT 15*  --   ALKPHOS 112  --   BILITOT 0.9  --    Cardiac Enzymes No results for input(s): TROPONINI in the last 168 hours. RADIOLOGY:  No results found. ASSESSMENT AND PLAN:  [name] is a 47 y.o. [name] has a past medical history significant for HTN and DM recently seen by [name] for LLE edema now with worsening pain and swelling of LLE with bloody discharge. Also c/o worsening hyperglycemia and nausea. No fever. Denies CP or SOB.   1.  Left lower extremity cellulitis/necrotic area with severe chronic venous stasis dermatitis and venous ulceration. -Appreciate Dr. Nino Parsley input -On empirically IV Zosyn and vanc -Blood cultures negative.  White count 17,000--- 13,000 -s/p I and d and debridement of left leg abscess/necrotic area pod #1. Wound vac + -per dr dew change wound vac on thru or Friday in OR  2.  Uncontrolled type 2 diabetes -Patient did not take insulin as he  was supposed to at home.  He continued his metformin -insulin Lantus 17 unitstwice a day and do meal coverage with insulin aspart and sliding scale -DC metformin given elevated creatinine  3.  Hypertension  -continue home meds  4.  Acute on chronic CKD stage III -Avoid nephrotoxins -Creatinine 2.22--- fluids--- 2.10--2.32 -Nephrology input appreciated  5.  DVT prophylaxis subcu  heparin  PT to start seeing pt  Case discussed with Care Management/Social Worker. Management plans discussed with the patient, family and they are in agreement.  CODE STATUS: Full  DVT Prophylaxis: Heparin  TOTAL TIME TAKING CARE OF THIS PATIENT: 25 minutes.  >50% time spent on counselling and coordination of care  POSSIBLE D/C IN few DAYS, DEPENDING ON CLINICAL CONDITION.  Note: This dictation was prepared with Dragon dictation along with smaller phrase technology. Any transcriptional errors that result from this process are unintentional.  Fritzi Mandes M.D on 10/04/2017 at 8:06 AM  Between 7am to 6pm - Pager - [phone]  After 6pm go to [email or web address] - password [email or web address] [facility]  Office  [phone]  CC: Primary care physician; Sallee Lange, NPPatient ID: [name], [name]   DOB: [birth year], 47 y.o.   MRN: [id number]

## 2017-10-04 NOTE — NC FL2 (Signed)
[place] LEVEL OF CARE SCREENING TOOL     IDENTIFICATION  Patient Name: [name] Birthdate: 04/07/1971 Sex: male Admission Date (Current Location): 10/01/2017  [place] and [place] Number:  Engineering geologist and Address:  [facility], [address], [place], [place] [address]      Provider Number: [id number]  Attending Physician Name and Address:  Fritzi Mandes, MD  Relative Name and Phone Number:       Current Level of Care: Hospital Recommended Level of Care: [place] Prior Approval Number:    Date Approved/Denied:   PASRR Number: ([id number])  Discharge Plan: SNF    Current Diagnoses: Patient Active Problem List   Diagnosis Date Noted  . Cellulitis 10/01/2017  . PVD (peripheral vascular disease) ([place]) 10/01/2017  . Hypertension 09/20/2017  . Diabetes mellitus without complication ([place]) 56/21/3086  . Swelling of limb 09/20/2017    Orientation RESPIRATION BLADDER Height & Weight     Self, Time, Situation, Place  Normal Continent Weight: (!) 406 lb 3.2 oz (184.3 kg) Height:  6\' 4"  (193 cm)  BEHAVIORAL SYMPTOMS/MOOD NEUROLOGICAL BOWEL NUTRITION STATUS      Continent Diet(Carb Modified)  AMBULATORY STATUS COMMUNICATION OF NEEDS Skin   Extensive Assist Verbally Wound Vac(IV ABX, lower left leg posterior)                       Personal Care Assistance Level of Assistance  Bathing, Feeding, Dressing Bathing Assistance: Limited assistance Feeding assistance: Independent Dressing Assistance: Limited assistance     Functional Limitations Info  Sight, Hearing, Speech Sight Info: Adequate Hearing Info: Adequate Speech Info: Adequate    SPECIAL CARE FACTORS FREQUENCY  PT (By licensed PT), OT (By licensed OT)     PT Frequency: (5) OT Frequency: (5)            Contractures      Additional Factors Info  Code Status, Allergies Code Status Info: (Full Code) Allergies Info:  (LISINOPRIL )           Current Medications (10/04/2017):  This is the current hospital active medication list Current Facility-Administered Medications  Medication Dose Route Frequency Provider Last Rate Last Dose  . acetaminophen (TYLENOL) tablet 650 mg  650 mg Oral Q6H PRN [name], MD   650 mg at 10/02/17 2120   Or  . acetaminophen (TYLENOL) suppository 650 mg  650 mg Rectal Q6H PRN [name], MD      . amLODipine (NORVASC) tablet 10 mg  10 mg Oral Daily [name], MD   10 mg at 10/02/17 [phone]  . atenolol (TENORMIN) tablet 25 mg  25 mg Oral Daily [name], MD   25 mg at 10/03/17 6962  . bisacodyl (DULCOLAX) suppository 10 mg  10 mg Rectal Daily PRN [name], MD      . docusate sodium (COLACE) capsule 100 mg  100 mg Oral BID [name], MD   100 mg at 10/04/17 0958  . enoxaparin (LOVENOX) injection 40 mg  40 mg Subcutaneous BID Fritzi Mandes, MD   40 mg at 10/04/17 0958  . Influenza vac split quadrivalent PF (FLUARIX) injection 0.5 mL  0.5 mL Intramuscular Tomorrow-1000 Fulton Reek D, MD      . insulin aspart (novoLOG) injection 0-15 Units  0-15 Units Subcutaneous TID WC Fritzi Mandes, MD   3 Units at 10/03/17 1710  . insulin glargine (LANTUS) injection 17 Units  17 Units Subcutaneous BID Fritzi Mandes, MD   17 Units at 10/04/17 [phone]  . morphine 2 MG/ML injection 2 mg  2 mg Intravenous Q2H PRN [name], MD   2 mg at 10/04/17 5498  . ondansetron (ZOFRAN) tablet 4 mg  4 mg Oral Q6H PRN [name], MD       Or  . ondansetron (ZOFRAN) injection 4 mg  4 mg Intravenous Q6H PRN [name], MD      . oxyCODONE-acetaminophen (PERCOCET/ROXICET) 5-325 MG per tablet 1-2 tablet  1-2 tablet Oral Q6H PRN Fritzi Mandes, MD   2 tablet at 10/04/17 [phone]  . piperacillin-tazobactam (ZOSYN) IVPB 3.375 g  3.375 g Intravenous Q8H Coffee, Donna Christen, RPH 12.5 mL/hr at 10/04/17 0614 3.375 g at 10/04/17 5830  . pneumococcal 23 valent vaccine  (PNU-IMMUNE) injection 0.5 mL  0.5 mL Intramuscular Tomorrow-1000 [name], MD      . vancomycin (VANCOCIN) 1,500 mg in sodium chloride 0.9 % 500 mL IVPB  1,500 mg Intravenous Once Orbie Pyo, MD 250 mL/hr at 10/04/17 1005 1,500 mg at 10/04/17 1005     Discharge Medications: Please see discharge summary for a list of discharge medications.  Relevant Imaging Results:  Relevant Lab Results:   Additional Information (SSN: [id number])  Smith Mince, Student-Social Work

## 2017-10-04 NOTE — Clinical Social Work Note (Addendum)
Clinical Social Work Assessment  Patient Details  Name: [name] MRN: [id number] Date of Birth: 18-Oct-1970  Date of referral:  10/04/17               Reason for consult:  Discharge Planning, Facility Placement                Permission sought to share information with:  Chartered certified accountant granted to share information::  Yes, Verbal Permission Granted  Name::      [name]::   [name]  Relationship::     Contact Information:     Housing/Transportation Living arrangements for the past 2 months:  [name] of Information:  Patient Patient Interpreter Needed:  None Criminal Activity/Legal Involvement Pertinent to Current Situation/Hospitalization:  No - Comment as needed Significant Relationships:  Parents Lives with:  Self Do you feel safe going back to the place where you live?  No Need for family participation in patient care:  Yes (Comment)  Care giving concerns: Patient lives alone in [place].   Social Worker assessment / plan: [name] (CSW) received verbal SNF consult from RN in progression rounds. PT is recommending SNF. Social work Theatre manager met with patient alone at bedside. Patient was laying in bed alert and oriented x4. Social work Theatre manager introduced self and explained the role of the [name] Patient shared that he lives in [place] alone and [name] [phone]) is his primary contact. Patient is currently going through separation from spouse listed on chart. Social work Theatre manager explained that PT is recommending SNF and [name]) would have to approve it. Patient verbalized his understanding and is agreeable to SNF placement. Patient prefers [name] FL2 completed and faxed out. CSW and Social work Theatre manager will continue to follow up and assist.  Social work intern presented bed offers to patient. He only had 1 offer from [name] because of  the need for bariatric equipment. Patient accepted bed offer from [name] Per [facility] admissions coordinator at [name] they do have a bariatric bed and will start [facility] SNF authorization. [name] is also aware that patient will need a wound vac.   Employment status:  Therapist, music:  Managed Care PT Recommendations:  [place] / Referral to community resources:  [name]  Patient/Family's Response to care: Patient is agreeable to SNF placement and prefers [name]  Patient/Family's Understanding of and Emotional Response to Diagnosis, Current Treatment, and Prognosis: Patient was pleasant and thanked Social work Theatre manager for her assistance.  Emotional Assessment Appearance:  Appears stated age Attitude/Demeanor/Rapport:    Affect (typically observed):  Accepting, Pleasant, Calm Orientation:  Oriented to Self, Oriented to Place, Oriented to  Time, Oriented to Situation Alcohol / Substance use:  Not Applicable Psych involvement (Current and /or in the community):  No (Comment)  Discharge Needs  Concerns to be addressed:  Care Coordination, Discharge Planning Concerns Readmission within the last 30 days:  No Current discharge risk:  Dependent with Mobility Barriers to Discharge:  Continued Medical Work up   [name], Student-Social Work 10/04/2017, 11:31 AM

## 2017-10-05 ENCOUNTER — Encounter: Payer: Self-pay | Admitting: Anesthesiology

## 2017-10-05 LAB — GLUCOSE, CAPILLARY
GLUCOSE-CAPILLARY: 150 mg/dL — AB (ref 65–99)
GLUCOSE-CAPILLARY: 88 mg/dL (ref 65–99)
GLUCOSE-CAPILLARY: 121 mg/dL — AB (ref 65–99)
Glucose-Capillary: 166 mg/dL — ABNORMAL HIGH (ref 65–99)
Glucose-Capillary: 98 mg/dL (ref 65–99)

## 2017-10-05 LAB — RENAL FUNCTION PANEL
ANION GAP: 6 (ref 5–15)
Albumin: 1.7 g/dL — ABNORMAL LOW (ref 3.5–5.0)
BUN: 52 mg/dL — ABNORMAL HIGH (ref 6–20)
CHLORIDE: 107 mmol/L (ref 101–111)
CO2: 23 mmol/L (ref 22–32)
Calcium: 8.1 mg/dL — ABNORMAL LOW (ref 8.9–10.3)
Creatinine, Ser: 2.81 mg/dL — ABNORMAL HIGH (ref 0.61–1.24)
GFR calc Af Amer: 29 mL/min — ABNORMAL LOW (ref >60)
GFR, EST NON AFRICAN AMERICAN: 25 mL/min — AB (ref >60)
Glucose, Bld: 189 mg/dL — ABNORMAL HIGH (ref 65–99)
POTASSIUM: 3.9 mmol/L (ref 3.5–5.1)
Phosphorus: 3.9 mg/dL (ref 2.5–4.6)
Sodium: 136 mmol/L (ref 135–145)

## 2017-10-05 LAB — VANCOMYCIN, RANDOM: Vancomycin Rm: 23

## 2017-10-05 MED ORDER — INSULIN ASPART 100 UNIT/ML ~~LOC~~ SOLN
2.0000 [IU] | Freq: Three times a day (TID) | SUBCUTANEOUS | Status: DC
  Administered 2017-10-07 – 2017-10-19 (×21): 2 [IU] via SUBCUTANEOUS
  Filled 2017-10-05 (×24): qty 1

## 2017-10-05 MED ORDER — SODIUM CHLORIDE 0.9 % IV SOLN
INTRAVENOUS | Status: DC
  Administered 2017-10-06 – 2017-10-07 (×2): via INTRAVENOUS

## 2017-10-05 MED ORDER — NEPRO/CARBSTEADY PO LIQD
237.0000 mL | Freq: Two times a day (BID) | ORAL | Status: DC
  Administered 2017-10-06 – 2017-10-10 (×7): 237 mL via ORAL

## 2017-10-05 NOTE — Progress Notes (Signed)
Per PACU pt is not on surgery schedule for today. On call Vascular doc paged to confirm. MD Schnier returned call and notified nurse that pt is on his schedule for Friday. Per MD he will cancel NPO order. Pt notified.

## 2017-10-05 NOTE — Progress Notes (Signed)
[facility] NAME: [name]    MR#:  [id number]  DATE OF BIRTH:  04/23/1971  SUBJECTIVE:  Came in with increasing redness and excessive drainage from the left leg. Left leg pain REVIEW OF SYSTEMS:   Review of Systems  Constitutional: Negative for chills, fever and weight loss.  HENT: Negative for ear discharge, ear pain and nosebleeds.   Eyes: Negative for blurred vision, pain and discharge.  Respiratory: Negative for sputum production, shortness of breath, wheezing and stridor.   Cardiovascular: Negative for chest pain, palpitations, orthopnea and PND.  Gastrointestinal: Negative for abdominal pain, diarrhea, nausea and vomiting.  Genitourinary: Negative for frequency and urgency.  Musculoskeletal: Negative for back pain and joint pain.  Neurological: Positive for weakness. Negative for sensory change, speech change and focal weakness.  Psychiatric/Behavioral: Negative for depression and hallucinations. The patient is not nervous/anxious.    Tolerating Diet:yes Tolerating PT: pending  DRUG ALLERGIES:   Allergies  Allergen Reactions  . Lisinopril Swelling    VITALS:  Blood pressure 118/68, pulse 73, temperature 98.3 F (36.8 C), temperature source Oral, resp. rate 18, height 6\' 4"  (1.93 m), weight (!) 182.4 kg (402 lb 1.6 oz), SpO2 95 %.  PHYSICAL EXAMINATION:   Physical Exam  GENERAL:  47 y.o.-year-old patient lying in the bed with no acute distress.  EYES: Pupils equal, round, reactive to light and accommodation. No scleral icterus. Extraocular muscles intact.  HEENT: Head atraumatic, normocephalic. Oropharynx and nasopharynx clear.  NECK:  Supple, no jugular venous distention. No thyroid enlargement, no tenderness.  LUNGS: Normal breath sounds bilaterally, no wheezing, rales, rhonchi. No use of accessory muscles of respiration.  CARDIOVASCULAR: S1, S2 normal. No murmurs, rubs, or gallops.  ABDOMEN: Soft,  nontender, nondistended. Bowel sounds present. No organomegaly or mass.  EXTREMITIES: Left leg edema with wound vac+ NEUROLOGIC: Cranial nerves II through XII are intact. No focal Motor or sensory deficits b/l.   PSYCHIATRIC:  patient is alert and oriented x 3.  SKIN: No obvious rash, lesion, or ulcer.   LABORATORY PANEL:  CBC Recent Labs  Lab 10/02/17 0439  WBC 13.3*  HGB 11.0*  HCT 34.3*  PLT 281    Chemistries  Recent Labs  Lab 10/02/17 0439  10/05/17 0305  NA 133*   < > 136  K 3.8   < > 3.9  CL 102   < > 107  CO2 23   < > 23  GLUCOSE 386*   < > 189*  BUN 56*   < > 52*  CREATININE 2.10*   < > 2.81*  CALCIUM 8.3*   < > 8.1*  AST 18  --   --   ALT 15*  --   --   ALKPHOS 112  --   --   BILITOT 0.9  --   --    < > = values in this interval not displayed.   Cardiac Enzymes No results for input(s): TROPONINI in the last 168 hours. RADIOLOGY:  No results found. ASSESSMENT AND PLAN:  [name] is a 47 y.o. male has a past medical history significant for HTN and DM recently seen by [name] for LLE edema now with worsening pain and swelling of LLE with bloody discharge. Also c/o worsening hyperglycemia and nausea. No fever. Denies CP or SOB.   1. Left lower extremity cellulitis/necrotic area with severe chronic venous stasis dermatitis and venous ulceration. -Appreciate Dr. Nino Parsley input -On empirically  IV Zosyn and vanc -Blood cultures negative.  White count 17,000--- 13,000 -s/p I and d and debridement of left leg abscess/necrotic area pod #2 Wound vac + -per dr dew change wound vac on thru or Friday in OR -WC were NOT sent from the OR  2.  Uncontrolled type 2 diabetes -Patient did not take insulin as he was supposed to at home.  He continued his metformin -insulin Lantus 17 unitstwice a day and do meal coverage with insulin aspart and sliding scale -DC metformin given elevated creatinine  3.  Hypertension  -continue home meds  4.  Acute on chronic CKD  stage III -Avoid nephrotoxins -Creatinine 2.22--- fluids--- 2.10--2.32--2.8 -Nephrology input appreciated  5.  DVT prophylaxis subcu heparin  PT recommends rehab. Pt will benefit given wound vac and need for changes   Case discussed with Care Management/Social Worker. Management plans discussed with the patient, family and they are in agreement.  CODE STATUS: Full  DVT Prophylaxis: Heparin  TOTAL TIME TAKING CARE OF THIS PATIENT: 25 minutes.  >50% time spent on counselling and coordination of care  POSSIBLE D/C IN few DAYS, DEPENDING ON CLINICAL CONDITION.  Note: This dictation was prepared with Dragon dictation along with smaller phrase technology. Any transcriptional errors that result from this process are unintentional.  Fritzi Mandes M.D on 10/05/2017 at 7:55 AM  Between 7am to 6pm - Pager - [phone]  After 6pm go to [email or web address] - password [email or web address] [facility]  Office  [phone]  CC: Primary care physician; [name], NPPatient ID: [name], male   DOB: 05-05-1971, 47 y.o.   MRN: [id number]

## 2017-10-05 NOTE — Progress Notes (Signed)
RN called to room by patient wound vac was beeping "leak". Attempted to reinforce dressing without success. RN out of the room to call [name]. Upon leaving the room, Cindy RN from preop holding called to make pt NPO for wound vac change this afternoon. Pt made aware of the NPO status and wound vac change scheduled for this PM. Will continue to monitor.

## 2017-10-05 NOTE — Progress Notes (Signed)
Wound vac dressing came loose, reinforced with tegaderm. Now working well on a continuous 125 pressure cycle.

## 2017-10-05 NOTE — Progress Notes (Signed)
Per [facility] admissions coordinator at [facility] authorization has been received today and is good for 7 days. Per [facility] Billings they have a wound vac and bariatric bed for patient. Plan is for patient to D/C to [facility] pending medical clearance. Patient is aware of above.   [employer], LCSW [phone]

## 2017-10-05 NOTE — Progress Notes (Signed)
[facility], [place] 10/05/17  Subjective:   Patient reports his wound vac lost suction yesterday and states that the staff is working to fix it. He awaits his wound vac change tomorrow in the OR (2/7). He does not have any complaints except for a coughing episode overnight that he feels might be related to undiagnosed OSA.  Patient had debridement in OR 10/03/17; Now has a wound vac Working with PT. Recommendation for SNF. Met with Education officer, museum. Met with chaplain yesterday (2/5). Able to eat without nausea or vomiting S Creatinine is slightly higher today at 2.81 UOP recorded at 1240 cc   Objective:  Vital signs in last 24 hours:  Temp:  [98.3 F (36.8 C)-98.8 F (37.1 C)] 98.3 F (36.8 C) (02/06 0331) Pulse Rate:  [65-79] 73 (02/06 0331) Resp:  [16-18] 18 (02/06 0331) BP: (118-140)/(62-73) 118/68 (02/06 0331) SpO2:  [95 %-99 %] 95 % (02/06 0331) Weight:  [182.4 kg (402 lb 1.6 oz)] 182.4 kg (402 lb 1.6 oz) (02/06 3818)  Weight change: -1.86 kg (-1.6 oz) Filed Weights   10/03/17 0604 10/04/17 0428 10/05/17 0623  Weight: (!) 183.2 kg (403 lb 12.8 oz) (!) 184.3 kg (406 lb 3.2 oz) (!) 182.4 kg (402 lb 1.6 oz)    Intake/Output:    Intake/Output Summary (Last 24 hours) at 10/05/2017 1147 Last data filed at 10/05/2017 1000 Gross per 24 hour  Intake 1400 ml  Output 1940 ml  Net -540 ml     Physical Exam: General: Normocephalic and atraumatic.   HEENT Mucous membranes moist.   Neck Nontender.   Pulm/lungs Lungs clear to auscultation bilaterally. No wheezing.   CVS/Heart Regular rate and rhythm. No murmurs, rubs, gallops.   Abdomen:  Soft and nontender.   Extremities: Some dependent edema on rt leg  Neurologic: AAOX3. Speech and affect normal.  Skin: LLE edema with erythema; wound vac          Basic Metabolic Panel:  Recent Labs  Lab 10/01/17 1035 10/01/17 1333 10/02/17 0439 10/03/17 0130 10/05/17 0305  NA 131*  --  133* 136 136  K  4.1  --  3.8 3.7 3.9  CL 97*  --  102 106 107  CO2 25  --  23 19* 23  GLUCOSE 477*  --  386* 253* 189*  BUN 56*  --  56* 53* 52*  CREATININE 2.13* 2.22* 2.10* 2.32* 2.81*  CALCIUM 8.8*  --  8.3* 8.4* 8.1*  PHOS  --   --   --  3.7 3.9     CBC: Recent Labs  Lab 10/01/17 1035 10/01/17 1333 10/02/17 0439  WBC 17.1* 17.0* 13.3*  HGB 12.0* 11.3* 11.0*  HCT 38.1* 35.3* 34.3*  MCV 89.0 88.2 89.1  PLT 365 312 281     No results found for: HEPBSAG, HEPBSAB, HEPBIGM    Microbiology:  Recent Results (from the past 240 hour(s))  Blood Culture (routine x 2)     Status: None (Preliminary result)   Collection Time: 10/01/17 10:41 AM  Result Value Ref Range Status   Specimen Description BLOOD LEFT ANTECUBITAL  Final   Special Requests   Final    BOTTLES DRAWN AEROBIC AND ANAEROBIC Blood Culture results may not be optimal due to an excessive volume of blood received in culture bottles   Culture   Final    NO GROWTH 4 DAYS Performed at [facility], [address], [place], [place] [address]    Report Status PENDING  Incomplete  Blood Culture (routine x 2)     Status: None (Preliminary result)   Collection Time: 10/01/17 11:31 AM  Result Value Ref Range Status   Specimen Description BLOOD BLOOD RIGHT ARM  Final   Special Requests   Final    BOTTLES DRAWN AEROBIC AND ANAEROBIC Blood Culture adequate volume   Culture   Final    NO GROWTH 4 DAYS Performed at [facility], [address] [place], [place] [address]    Report Status PENDING  Incomplete  MRSA PCR Screening     Status: None   Collection Time: 10/03/17  8:02 AM  Result Value Ref Range Status   MRSA by PCR NEGATIVE NEGATIVE Final    Comment:        The GeneXpert MRSA Assay (FDA approved for NASAL specimens only), is one component of a comprehensive MRSA colonization surveillance program. It is not intended to diagnose MRSA infection nor to guide or monitor treatment for MRSA  infections. Performed at [facility], [address] [place], [place] [address]     Coagulation Studies: No results for input(s): LABPROT, INR in the last 72 hours.  Urinalysis: Recent Labs    10/03/17 1120  COLORURINE YELLOW*  LABSPEC 1.015  PHURINE 5.0  GLUCOSEU NEGATIVE  HGBUR SMALL*  BILIRUBINUR NEGATIVE  KETONESUR NEGATIVE  PROTEINUR 30*  NITRITE NEGATIVE  LEUKOCYTESUR NEGATIVE      Imaging: No results found.   Medications:   . [START ON 10/06/2017] sodium chloride    . piperacillin-tazobactam (ZOSYN)  IV 3.375 g (10/05/17 0617)   . amLODipine  10 mg Oral Daily  . atenolol  25 mg Oral Daily  . docusate sodium  100 mg Oral BID  . enoxaparin (LOVENOX) injection  40 mg Subcutaneous BID  . Influenza vac split quadrivalent PF  0.5 mL Intramuscular Tomorrow-1000  . insulin aspart  0-15 Units Subcutaneous TID WC  . insulin glargine  17 Units Subcutaneous BID  . pneumococcal 23 valent vaccine  0.5 mL Intramuscular Tomorrow-1000   acetaminophen **OR** acetaminophen, bisacodyl, morphine injection, ondansetron **OR** ondansetron (ZOFRAN) IV, oxyCODONE-acetaminophen  Assessment/ Plan:  47 y.o. black male with diabetes mellitus type II, diabetic retinopathy and neuropathy, hypertension, obesity, venous insufficiency, angioedema with ACE-I who was admitted to [facility] 10/01/2017 for left leg abscess and cellulitis.  1. Acute Renal Failure on Chronic Kidney Disease Stage III with Proteinuria - Baseline = Creatinine: 1.6; GFR= 58 (8/18) - CKD secondary to Diabetic Nephropathy. A1C 15.1 (10/01/17) - Acute renal failure with active infection of cellulitis. Urinalysis and protein to creatinine ratio 0.8. Normal renal ultrasound.  -Creatinine slightly worse compared with yesterday 2.10 -->2.32->2.82 - Oral intake is adequate,   - currently getting NS at 50 cc/hr Worsening of S Creatinine is likely secondary to concurrent infection/cellulitis Will monitor closely  2.  Hypertension with LE edema . Currently taking amlodipine, atenolol and blood pressure stable. - Continue holding HCTZ.  3. DM2 with CKD and insulin dependent. -Hemoglobin A1C 15.1%  -Allergy to ACE-I -Hold metformin d/t ARF  4. LLE cellulitis.Abx as per IM team  -Surgical debridement (10/03/17) - Wound vac change planned for today   LOS: [place] 2/6/201911:47 [place], [place]

## 2017-10-05 NOTE — Progress Notes (Signed)
Pharmacy Antibiotic Note  [name] is a 47 y.o. male admitted on 10/01/2017 with cellulitis.  Pharmacy has been consulted for zosyn and vancomycin dosing.  Plan: Vancomycin 1500mg  IV every 12 hours.  Goal trough 10-15 mcg/mL. Zosyn 3.375g IV q8h (4 hour infusion).   02/04 @ 0130 VT 36 supratherapeutic drawn appropriately. Scr rising, will hold off vanc for now and will check a random level in 12 hours ~ patient expected to be around 18 mcg/mL in 12 hours. Will reassess regimen once level < 20 mcg/mL  02/04 1642 Vanc random 24. Level still elevated. Will order Vanc random for 2/5 0130 (24 hours after trough level).   02/05 0200 vanc level 18. Will give one time dose of 1500 mg and recheck random vanc with 2/6 AM labs.  02/06 AM vanc level 23. No dose ordered. Recheck random vanc tomorrow AM.  Height: 6\' 4"  (193 cm) Weight: (!) 402 lb 1.6 oz (182.4 kg) IBW/kg (Calculated) : 86.8  Temp (24hrs), Avg:98.5 F (36.9 C), Min:98.3 F (36.8 C), Max:98.8 F (37.1 C)  Recent Labs  Lab 10/01/17 1035 10/01/17 1333 10/02/17 0439 10/03/17 0130  10/04/17 0147 10/05/17 0305  WBC 17.1* 17.0* 13.3*  --   --   --   --   CREATININE 2.13* 2.22* 2.10* 2.32*  --   --  2.81*  LATICACIDVEN 1.8 2.1*  --   --   --   --   --   VANCOTROUGH  --   --   --  36*  --   --   --   VANCORANDOM  --   --   --   --    < > 18 23   < > = values in this interval not displayed.    Estimated Creatinine Clearance: 57.5 mL/min (A) (by C-G formula based on SCr of 2.81 mg/dL (H)).    Allergies  Allergen Reactions  . Lisinopril Swelling    Antimicrobials this admission: Anti-infectives (From admission, onward)   Start     Dose/Rate Route Frequency Ordered Stop   10/04/17 0600  vancomycin (VANCOCIN) 1,500 mg in sodium chloride 0.9 % 500 mL IVPB     1,500 mg 250 mL/hr over 120 Minutes Intravenous  Once 10/04/17 0426 10/04/17 1303   10/03/17 1330  ceFAZolin (ANCEF) IVPB 1 g/50 mL premix     1 g 100 mL/hr  over 30 Minutes Intravenous  Once 10/03/17 1323 10/03/17 1450   10/03/17 1320  ceFAZolin (ANCEF) 1-4 GM/50ML-% IVPB    Comments:  Machia, Millissa   : cabinet override      10/03/17 1320 10/03/17 1420   10/01/17 1800  piperacillin-tazobactam (ZOSYN) IVPB 3.375 g     3.375 g 12.5 mL/hr over 240 Minutes Intravenous Every 8 hours 10/01/17 1133     10/01/17 1400  vancomycin (VANCOCIN) 1,500 mg in sodium chloride 0.9 % 500 mL IVPB  Status:  Discontinued     1,500 mg 250 mL/hr over 120 Minutes Intravenous Every 12 hours 10/01/17 1156 10/03/17 0203   10/01/17 1130  piperacillin-tazobactam (ZOSYN) IVPB 3.375 g     3.375 g 100 mL/hr over 30 Minutes Intravenous  Once 10/01/17 1120 10/01/17 1211   10/01/17 1130  vancomycin (VANCOCIN) IVPB 1000 mg/200 mL premix     1,000 mg 200 mL/hr over 60 Minutes Intravenous  Once 10/01/17 1120 10/01/17 1300      Microbiology results: Recent Results (from the past 240 hour(s))  Blood Culture (routine x 2)  Status: None (Preliminary result)   Collection Time: 10/01/17 10:41 AM  Result Value Ref Range Status   Specimen Description BLOOD LEFT ANTECUBITAL  Final   Special Requests   Final    BOTTLES DRAWN AEROBIC AND ANAEROBIC Blood Culture results may not be optimal due to an excessive volume of blood received in culture bottles   Culture   Final    NO GROWTH 3 DAYS Performed at [facility], [address], [place], [place] [address]    Report Status PENDING  Incomplete  Blood Culture (routine x 2)     Status: None (Preliminary result)   Collection Time: 10/01/17 11:31 AM  Result Value Ref Range Status   Specimen Description BLOOD BLOOD RIGHT ARM  Final   Special Requests   Final    BOTTLES DRAWN AEROBIC AND ANAEROBIC Blood Culture adequate volume   Culture   Final    NO GROWTH 3 DAYS Performed at [facility], [address] [place], [place] [address]    Report Status PENDING  Incomplete  MRSA PCR Screening     Status: None    Collection Time: 10/03/17  8:02 AM  Result Value Ref Range Status   MRSA by PCR NEGATIVE NEGATIVE Final    Comment:        The GeneXpert MRSA Assay (FDA approved for NASAL specimens only), is one component of a comprehensive MRSA colonization surveillance program. It is not intended to diagnose MRSA infection nor to guide or monitor treatment for MRSA infections. Performed at [facility], [address] [place], [place] [address]    Thank you for allowing pharmacy to be a part of this patient's care.  Eloise Harman, PharmD, BCPS Clinical Pharmacist 10/05/2017 6:25 AM

## 2017-10-06 LAB — BASIC METABOLIC PANEL
Anion gap: 8 (ref 5–15)
BUN: 46 mg/dL — AB (ref 6–20)
CHLORIDE: 108 mmol/L (ref 101–111)
CO2: 23 mmol/L (ref 22–32)
CREATININE: 2.71 mg/dL — AB (ref 0.61–1.24)
Calcium: 8.5 mg/dL — ABNORMAL LOW (ref 8.9–10.3)
GFR calc Af Amer: 31 mL/min — ABNORMAL LOW (ref >60)
GFR calc non Af Amer: 26 mL/min — ABNORMAL LOW (ref >60)
GLUCOSE: 131 mg/dL — AB (ref 65–99)
POTASSIUM: 4.1 mmol/L (ref 3.5–5.1)
SODIUM: 139 mmol/L (ref 135–145)

## 2017-10-06 LAB — CBC
HCT: 29.3 % — ABNORMAL LOW (ref 40.0–52.0)
Hemoglobin: 9.6 g/dL — ABNORMAL LOW (ref 13.0–18.0)
MCH: 29.2 pg (ref 26.0–34.0)
MCHC: 32.8 g/dL (ref 32.0–36.0)
MCV: 88.9 fL (ref 80.0–100.0)
Platelets: 290 10*3/uL (ref 150–440)
RBC: 3.29 MIL/uL — AB (ref 4.40–5.90)
RDW: 14.2 % (ref 11.5–14.5)
WBC: 9.1 10*3/uL (ref 3.8–10.6)

## 2017-10-06 LAB — GLUCOSE, CAPILLARY
GLUCOSE-CAPILLARY: 102 mg/dL — AB (ref 65–99)
Glucose-Capillary: 159 mg/dL — ABNORMAL HIGH (ref 65–99)
Glucose-Capillary: 144 mg/dL — ABNORMAL HIGH (ref 65–99)
Glucose-Capillary: 139 mg/dL — ABNORMAL HIGH (ref 65–99)

## 2017-10-06 LAB — CULTURE, BLOOD (ROUTINE X 2)
CULTURE: NO GROWTH
Culture: NO GROWTH
Special Requests: ADEQUATE

## 2017-10-06 LAB — TYPE AND SCREEN
ABO/RH(D): A POS
Antibody Screen: NEGATIVE

## 2017-10-06 LAB — PROTIME-INR
INR: 1.16
Prothrombin Time: 14.7 seconds (ref 11.4–15.2)

## 2017-10-06 LAB — APTT: aPTT: 35 seconds (ref 24–36)

## 2017-10-06 LAB — VANCOMYCIN, RANDOM: Vancomycin Rm: 16

## 2017-10-06 LAB — MAGNESIUM: Magnesium: 2 mg/dL (ref 1.7–2.4)

## 2017-10-06 MED ORDER — VANCOMYCIN HCL 10 G IV SOLR
1500.0000 mg | Freq: Once | INTRAVENOUS | Status: AC
  Administered 2017-10-06: 1500 mg via INTRAVENOUS
  Filled 2017-10-06: qty 1500

## 2017-10-06 NOTE — Progress Notes (Signed)
Chaplain followed up from a previous support. During that time chaplain heard Pt say he was in a separation from his family. His mother was present. I came back to discuss his feelings about his separation. Pt mentioned he is an only child and the struggle going on between his family and mother. We explored the connection between his health physically and his health spiritually. He recognized some connection.    10/06/17 1000  Clinical Encounter Type  Visited With Patient  Visit Type Spiritual support  Referral From Nurse  Spiritual Encounters  Spiritual Needs Prayer;Emotional

## 2017-10-06 NOTE — Progress Notes (Signed)
Pharmacy Antibiotic Note  [name] is a 47 y.o. male admitted on 10/01/2017 with cellulitis.  Pharmacy has been consulted for zosyn and vancomycin dosing.  Plan: Vancomycin 1500mg  IV every 12 hours.  Goal trough 10-15 mcg/mL. Zosyn 3.375g IV q8h (4 hour infusion).   02/04 @ 0130 VT 36 supratherapeutic drawn appropriately. Scr rising, will hold off vanc for now and will check a random level in 12 hours ~ patient expected to be around 18 mcg/mL in 12 hours. Will reassess regimen once level < 20 mcg/mL  02/04 1642 Vanc random 24. Level still elevated. Will order Vanc random for 2/5 0130 (24 hours after trough level).   02/05 0200 vanc level 18. Will give one time dose of 1500 mg and recheck random vanc with 2/6 AM labs.  02/06 AM vanc level 23. No dose ordered. Recheck random vanc tomorrow AM.  2/7 AM vanc level 16. 1500 mg x1 ordered. Calculated kei 0.13 hr-1, t1/2 50 hours. Recheck vanc random tomorrow AM .  Height: 6\' 4"  (193 cm) Weight: (!) 402 lb 6.4 oz (182.5 kg) IBW/kg (Calculated) : 86.8  Temp (24hrs), Avg:98.5 F (36.9 C), Min:98.2 F (36.8 C), Max:99 F (37.2 C)  Recent Labs  Lab 10/01/17 1035 10/01/17 1333 10/02/17 0439 10/03/17 0130  10/05/17 0305 10/06/17 0557  WBC 17.1* 17.0* 13.3*  --   --   --  9.1  CREATININE 2.13* 2.22* 2.10* 2.32*  --  2.81* 2.71*  LATICACIDVEN 1.8 2.1*  --   --   --   --   --   VANCOTROUGH  --   --   --  36*  --   --   --   VANCORANDOM  --   --   --   --    < > 23 16   < > = values in this interval not displayed.    Estimated Creatinine Clearance: 59.6 mL/min (A) (by C-G formula based on SCr of 2.71 mg/dL (H)).    Allergies  Allergen Reactions  . Lisinopril Swelling    Antimicrobials this admission: Anti-infectives (From admission, onward)   Start     Dose/Rate Route Frequency Ordered Stop   10/06/17 0800  vancomycin (VANCOCIN) 1,500 mg in sodium chloride 0.9 % 500 mL IVPB     1,500 mg 250 mL/hr over 120 Minutes  Intravenous  Once 10/06/17 0715     10/04/17 0600  vancomycin (VANCOCIN) 1,500 mg in sodium chloride 0.9 % 500 mL IVPB     1,500 mg 250 mL/hr over 120 Minutes Intravenous  Once 10/04/17 0426 10/04/17 1303   10/03/17 1330  ceFAZolin (ANCEF) IVPB 1 g/50 mL premix     1 g 100 mL/hr over 30 Minutes Intravenous  Once 10/03/17 1323 10/03/17 1450   10/03/17 1320  ceFAZolin (ANCEF) 1-4 GM/50ML-% IVPB    Comments:  Machia, Millissa   : cabinet override      10/03/17 1320 10/03/17 1420   10/01/17 1800  piperacillin-tazobactam (ZOSYN) IVPB 3.375 g     3.375 g 12.5 mL/hr over 240 Minutes Intravenous Every 8 hours 10/01/17 1133     10/01/17 1400  vancomycin (VANCOCIN) 1,500 mg in sodium chloride 0.9 % 500 mL IVPB  Status:  Discontinued     1,500 mg 250 mL/hr over 120 Minutes Intravenous Every 12 hours 10/01/17 1156 10/03/17 0203   10/01/17 1130  piperacillin-tazobactam (ZOSYN) IVPB 3.375 g     3.375 g 100 mL/hr over 30 Minutes Intravenous  Once 10/01/17  1120 10/01/17 1211   10/01/17 1130  vancomycin (VANCOCIN) IVPB 1000 mg/200 mL premix     1,000 mg 200 mL/hr over 60 Minutes Intravenous  Once 10/01/17 1120 10/01/17 1300      Microbiology results: Recent Results (from the past 240 hour(s))  Blood Culture (routine x 2)     Status: None   Collection Time: 10/01/17 10:41 AM  Result Value Ref Range Status   Specimen Description BLOOD LEFT ANTECUBITAL  Final   Special Requests   Final    BOTTLES DRAWN AEROBIC AND ANAEROBIC Blood Culture results may not be optimal due to an excessive volume of blood received in culture bottles   Culture   Final    NO GROWTH 5 DAYS Performed at [facility], [address] [place], [place] [address]    Report Status 10/06/2017 FINAL  Final  Blood Culture (routine x 2)     Status: None   Collection Time: 10/01/17 11:31 AM  Result Value Ref Range Status   Specimen Description BLOOD BLOOD RIGHT ARM  Final   Special Requests   Final    BOTTLES DRAWN  AEROBIC AND ANAEROBIC Blood Culture adequate volume   Culture   Final    NO GROWTH 5 DAYS Performed at [facility], [address] [place], [place] [address]    Report Status 10/06/2017 FINAL  Final  MRSA PCR Screening     Status: None   Collection Time: 10/03/17  8:02 AM  Result Value Ref Range Status   MRSA by PCR NEGATIVE NEGATIVE Final    Comment:        The GeneXpert MRSA Assay (FDA approved for NASAL specimens only), is one component of a comprehensive MRSA colonization surveillance program. It is not intended to diagnose MRSA infection nor to guide or monitor treatment for MRSA infections. Performed at [facility], [address], [place], [place] [address]    Thank you for allowing pharmacy to be a part of this patient's care.  Eloise Harman, PharmD, BCPS Clinical Pharmacist 10/06/2017 7:18 AM

## 2017-10-06 NOTE — Progress Notes (Signed)
Physical Therapy Treatment Patient Details Name: [name] MRN: [id number] DOB: March 28, 1971 Today's Date: 10/06/2017    History of Present Illness Pt is a 47 y.o.malehas a past medical history significant forHTN and DM recently seen by [name] for LLE edema now with worsening pain and swelling of LLE with bloody discharge. Also c/o worsening hyperglycemia and nausea. No fever. Denies CP or SOB. No vomiting or diarrhea. In ER, WBC elevated and copius amounts fo bloody drainage oozing from LLE. Sugar >400. He is now admitted.  Assessment includes: LLE cellulitis/necrotic area with severe chronic venous stasis dermatitis and venous ulceration s/p LLE I&D with wound vac, uncontrolled DM II, HTN, and acute on chronic CKD III.    PT Comments    Pt agreeable to PT; reports LLE 5/10 pain. Noted to have significant active bloody drainage. Supine exercise performed with instruction on proper technique and breathing during exercises. Pt tolerates manual resist to several exercises. Pt encouraged to perform throughout the day. Transfers not tested; pt does note that it is difficult for him to maintain NWB on L with transfers. Pt able to perform bed mobility/repositioning with use of trapeze. Continue PT to progress strength to improve functional mobility and safety with mobility at this time. Of note, pt is scheduled to have surgery on LLE tomorrow.    Follow Up Recommendations  SNF     Equipment Recommendations       Recommendations for Other Services       Precautions / Restrictions Precautions Precautions: None Restrictions Weight Bearing Restrictions: Yes LLE Weight Bearing: Non weight bearing    Mobility  Bed Mobility               General bed mobility comments: Not tested  Transfers                 General transfer comment: Not tested; pt notes when he transfers to [facility] he "tries not to bear weight on L, but difficult"  Ambulation/Gait                  Stairs            Wheelchair Mobility    Modified Rankin (Stroke Patients Only)       Balance                                            Cognition Arousal/Alertness: Awake/alert Behavior During Therapy: WFL for tasks assessed/performed Overall Cognitive Status: Within Functional Limits for tasks assessed                                        Exercises General Exercises - Lower Extremity Ankle Circles/Pumps: AROM;Both;20 reps;Supine(L limited; neutral (due to bed) to small range DF) Quad Sets: Strengthening;Both;20 reps;Supine Gluteal Sets: Strengthening;Both;20 reps;Supine Short Arc Quad: AROM;Right;20 reps;Supine(a little over edge of bed for increased range/work) Heel Slides: AROM;Strengthening;Right;20 reps;Supine(resisted ext) Hip ABduction/ADduction: Strengthening;Right;20 reps;Supine(manual resist) Straight Leg Raises: Strengthening;Right;10 reps;Supine(2 sets) Other Exercises Other Exercises: education on proper breathing with exercises    General Comments        Pertinent Vitals/Pain Pain Assessment: 0-10 Pain Score: 5  Pain Location: L leg Pain Intervention(s): Limited activity within patient's tolerance;Monitored during session    Home Living  Prior Function            PT Goals (current goals can now be found in the care plan section) Progress towards PT goals: Progressing toward goals(slowly)    Frequency    Min 2X/week      PT Plan Current plan remains appropriate    Co-evaluation              AM-PAC PT "6 Clicks" Daily Activity  Outcome Measure  Difficulty turning over in bed (including adjusting bedclothes, sheets and blankets)?: A Little Difficulty moving from lying on back to sitting on the side of the bed? : A Little Difficulty sitting down on and standing up from a chair with arms (e.g., wheelchair, bedside commode, etc,.)?: A Little Help needed  moving to and from a bed to chair (including a wheelchair)?: A Little Help needed walking in hospital room?: A Lot Help needed climbing 3-5 steps with a railing? : Total 6 Click Score: 15    End of Session   Activity Tolerance: Patient tolerated treatment well Patient left: in bed;with call bell/phone within reach   PT Visit Diagnosis: Difficulty in walking, not elsewhere classified (R26.2);Muscle weakness (generalized) (M62.81)     Time: 9244-6286 PT Time Calculation (min) (ACUTE ONLY): 25 min  Charges:  $Therapeutic Exercise: 23-37 mins                    G CodesLarae Grooms, PTA 10/06/2017, 4:41 PM

## 2017-10-06 NOTE — Progress Notes (Signed)
[facility], [place] 10/06/17  Subjective:   Patient does not have any complaints today, except for a small amount of pain in his legs. He is aware that he is scheduled for the OR wound vac change on Friday (10/07/17).   Patient had debridement in OR 10/03/17; Now has a wound vac. Working with PT. Met with Education officer, museum. SNF authorization received 10/05/17 and good for 7 days per LCSW documentation 2/6. Met with chaplain again today (2/7). Able to eat without nausea or vomiting. S Creatinine is slightly improved at 2.71 UOP recorded at 400cc (2/7)   Objective:  Vital signs in last 24 hours:  Temp:  [98.3 F (36.8 C)-99 F (37.2 C)] 98.5 F (36.9 C) (02/07 0741) Pulse Rate:  [72-83] 74 (02/07 0741) Resp:  [15-18] 16 (02/07 0741) BP: (149-169)/(72-93) 149/72 (02/07 0741) SpO2:  [95 %-98 %] 98 % (02/07 0741) Weight:  [182.5 kg (402 lb 6.4 oz)] 182.5 kg (402 lb 6.4 oz) (02/07 0313)  Weight change: 0.136 kg (4.8 oz) Filed Weights   10/04/17 0428 10/05/17 0623 10/06/17 0313  Weight: (!) 184.3 kg (406 lb 3.2 oz) (!) 182.4 kg (402 lb 1.6 oz) (!) 182.5 kg (402 lb 6.4 oz)    Intake/Output:    Intake/Output Summary (Last 24 hours) at 10/06/2017 1410 Last data filed at 10/06/2017 0900 Gross per 24 hour  Intake 693.33 ml  Output 1400 ml  Net -706.67 ml     Physical Exam: General: Normocephalic and atraumatic.   HEENT Mucous membranes moist.   Neck Nontender.   Pulm/lungs Lungs clear to auscultation bilaterally. No wheezing.   CVS/Heart Regular rate and rhythm. No murmurs, rubs, gallops.   Abdomen:  Soft and nontender.   Extremities: Increasing edema on rt leg 2+  Neurologic: AAOX3. Speech and affect normal.  Skin: LLE edema with erythema; wound vac on left heel          Basic Metabolic Panel:  Recent Labs  Lab 10/01/17 1035 10/01/17 1333 10/02/17 0439 10/03/17 0130 10/05/17 0305 10/06/17 0557  NA 131*  --  133* 136 136 139  K 4.1  --  3.8 3.7  3.9 4.1  CL 97*  --  102 106 107 108  CO2 25  --  23 19* 23 23  GLUCOSE 477*  --  386* 253* 189* 131*  BUN 56*  --  56* 53* 52* 46*  CREATININE 2.13* 2.22* 2.10* 2.32* 2.81* 2.71*  CALCIUM 8.8*  --  8.3* 8.4* 8.1* 8.5*  MG  --   --   --   --   --  2.0  PHOS  --   --   --  3.7 3.9  --      CBC: Recent Labs  Lab 10/01/17 1035 10/01/17 1333 10/02/17 0439 10/06/17 0557  WBC 17.1* 17.0* 13.3* 9.1  HGB 12.0* 11.3* 11.0* 9.6*  HCT 38.1* 35.3* 34.3* 29.3*  MCV 89.0 88.2 89.1 88.9  PLT 365 312 281 290     No results found for: HEPBSAG, HEPBSAB, HEPBIGM    Microbiology:  Recent Results (from the past 240 hour(s))  Blood Culture (routine x 2)     Status: None   Collection Time: 10/01/17 10:41 AM  Result Value Ref Range Status   Specimen Description BLOOD LEFT ANTECUBITAL  Final   Special Requests   Final    BOTTLES DRAWN AEROBIC AND ANAEROBIC Blood Culture results may not be optimal due to an excessive volume of blood received in  culture bottles   Culture   Final    NO GROWTH 5 DAYS Performed at [facility], [address] [place], [place] [address]    Report Status 10/06/2017 FINAL  Final  Blood Culture (routine x 2)     Status: None   Collection Time: 10/01/17 11:31 AM  Result Value Ref Range Status   Specimen Description BLOOD BLOOD RIGHT ARM  Final   Special Requests   Final    BOTTLES DRAWN AEROBIC AND ANAEROBIC Blood Culture adequate volume   Culture   Final    NO GROWTH 5 DAYS Performed at [facility], [address] [place], [place] [address]    Report Status 10/06/2017 FINAL  Final  MRSA PCR Screening     Status: None   Collection Time: 10/03/17  8:02 AM  Result Value Ref Range Status   MRSA by PCR NEGATIVE NEGATIVE Final    Comment:        The GeneXpert MRSA Assay (FDA approved for NASAL specimens only), is one component of a comprehensive MRSA colonization surveillance program. It is not intended to diagnose MRSA infection nor  to guide or monitor treatment for MRSA infections. Performed at [facility], [address] [place], [place] [address]     Coagulation Studies: Recent Labs    10/06/17 0557  LABPROT 14.7  INR 1.16    Urinalysis: No results for input(s): COLORURINE, LABSPEC, PHURINE, GLUCOSEU, HGBUR, BILIRUBINUR, KETONESUR, PROTEINUR, UROBILINOGEN, NITRITE, LEUKOCYTESUR in the last 72 hours.  Invalid input(s): APPERANCEUR    Imaging: No results found.   Medications:   . sodium chloride 50 mL/hr at 10/06/17 0025  . piperacillin-tazobactam (ZOSYN)  IV 3.375 g (10/06/17 1401)   . amLODipine  10 mg Oral Daily  . atenolol  25 mg Oral Daily  . docusate sodium  100 mg Oral BID  . enoxaparin (LOVENOX) injection  40 mg Subcutaneous BID  . feeding supplement (NEPRO CARB STEADY)  237 mL Oral BID BM  . Influenza vac split quadrivalent PF  0.5 mL Intramuscular Tomorrow-1000  . insulin aspart  0-15 Units Subcutaneous TID WC  . insulin aspart  2 Units Subcutaneous TID WC  . insulin glargine  17 Units Subcutaneous BID  . pneumococcal 23 valent vaccine  0.5 mL Intramuscular Tomorrow-1000   acetaminophen **OR** acetaminophen, bisacodyl, morphine injection, ondansetron **OR** ondansetron (ZOFRAN) IV, oxyCODONE-acetaminophen  Assessment/ Plan:  47 y.o. black male with diabetes mellitus type II, diabetic retinopathy and neuropathy, hypertension, obesity, venous insufficiency, angioedema with ACE-I who was admitted to [facility] 10/01/2017 for left leg abscess and cellulitis.  1. Acute Renal Failure on Chronic Kidney Disease Stage III with Proteinuria - Baseline = Creatinine: 1.6; GFR= 58 (8/18) - CKD secondary to Diabetic Nephropathy. A1C 15.1 (10/01/17) - Acute renal failure with active infection of cellulitis. Urinalysis and protein to creatinine ratio 0.82. Normal renal ultrasound.  -Creatinine slightly improved compared with yesterday 2.10 ->2.32->2.81->2.71 (2/7) - Oral intake is adequate,   -  Discontinue NS at 50 cc/hr with increased lower extremity edema. - Hgb decreased from 12.0 at admission 2/2 --> 9.6 today (2/7). Hct 29.3 (2/7). Monitor closely in case need for transfusion before OR. -Albumin 1.7 (2/3-2/6) - Continue to monitor renal function closely  2. Hypertension with LE edema . Currently taking amlodipine, atenolol and blood pressure stable at 149/72 (2/7). - Continue holding HCTZ.  3. DM2 with CKD and insulin dependent. -Hemoglobin A1C 15.1%. Glucose 131 today 2/7.  -Allergy to ACE-I -Hold metformin  d/t ARF  4. LLE cellulitis.Abx as per IM team  -Surgical debridement (10/03/17) - Wound vac change planned for Friday (2/8)   LOS: [place] 2/7/20192:10 [place], [place]

## 2017-10-06 NOTE — Progress Notes (Signed)
[name] at [place] NAME: [name]    MR#:  [id number]  DATE OF BIRTH:  1971-07-21  SUBJECTIVE:  No new complaints REVIEW OF SYSTEMS:   Review of Systems  Constitutional: Negative for chills, fever and weight loss.  HENT: Negative for ear discharge, ear pain and nosebleeds.   Eyes: Negative for blurred vision, pain and discharge.  Respiratory: Negative for sputum production, shortness of breath, wheezing and stridor.   Cardiovascular: Negative for chest pain, palpitations, orthopnea and PND.  Gastrointestinal: Negative for abdominal pain, diarrhea, nausea and vomiting.  Genitourinary: Negative for frequency and urgency.  Musculoskeletal: Negative for back pain and joint pain.  Neurological: Positive for weakness. Negative for sensory change, speech change and focal weakness.  Psychiatric/Behavioral: Negative for depression and hallucinations. The patient is not nervous/anxious.    Tolerating Diet:yes Tolerating PT: Rehab  DRUG ALLERGIES:   Allergies  Allergen Reactions  . Lisinopril Swelling    VITALS:  Blood pressure (!) 149/72, pulse 76, temperature 98.5 F (36.9 C), temperature source Oral, resp. rate 16, height 6\' 4"  (1.93 m), weight (!) 182.5 kg (402 lb 6.4 oz), SpO2 96 %.  PHYSICAL EXAMINATION:   Physical Exam  GENERAL:  47 y.o.-year-old patient lying in the bed with no acute distress.  EYES: Pupils equal, round, reactive to light and accommodation. No scleral icterus. Extraocular muscles intact.  HEENT: Head atraumatic, normocephalic. Oropharynx and nasopharynx clear.  NECK:  Supple, no jugular venous distention. No thyroid enlargement, no tenderness.  LUNGS: Normal breath sounds bilaterally, no wheezing, rales, rhonchi. No use of accessory muscles of respiration.  CARDIOVASCULAR: S1, S2 normal. No murmurs, rubs, or gallops.  ABDOMEN: Soft, nontender, nondistended. Bowel sounds present. No organomegaly or mass.   EXTREMITIES: Left leg edema with wound vac+ NEUROLOGIC: Cranial nerves II through XII are intact. No focal Motor or sensory deficits b/l.   PSYCHIATRIC:  patient is alert and oriented x 3.  SKIN: No obvious rash, lesion, or ulcer.   LABORATORY PANEL:  CBC Recent Labs  Lab 10/06/17 0557  WBC 9.1  HGB 9.6*  HCT 29.3*  PLT 290    Chemistries  Recent Labs  Lab 10/02/17 0439  10/06/17 0557  NA 133*   < > 139  K 3.8   < > 4.1  CL 102   < > 108  CO2 23   < > 23  GLUCOSE 386*   < > 131*  BUN 56*   < > 46*  CREATININE 2.10*   < > 2.71*  CALCIUM 8.3*   < > 8.5*  MG  --   --  2.0  AST 18  --   --   ALT 15*  --   --   ALKPHOS 112  --   --   BILITOT 0.9  --   --    < > = values in this interval not displayed.   Cardiac Enzymes No results for input(s): TROPONINI in the last 168 hours. RADIOLOGY:  No results found. ASSESSMENT AND PLAN:  [name] is a 47 y.o. male has a past medical history significant for HTN and DM recently seen by [name] for LLE edema now with worsening pain and swelling of LLE with bloody discharge. Also c/o worsening hyperglycemia and nausea. No fever. Denies CP or SOB.   1. Left lower extremity cellulitis/necrotic area with severe chronic venous stasis dermatitis and venous ulceration. -Appreciate Dr. Nino Parsley input -On empirically IV Zosyn  and vanc -Blood cultures negative.  White count 17,000--- 13,000 -s/p I and d and debridement of left leg abscess/necrotic area pod #2 Wound vac + -per dr dew change wound vac on  Friday in OR -WC were NOT sent from the OR  2.  Uncontrolled type 2 diabetes -Patient did not take insulin as he was supposed to at home.  He continued his metformin -insulin Lantus 17 unitstwice a day and do meal coverage with insulin aspart and sliding scale -DC metformin given elevated creatinine  3.  Hypertension  -continue home meds  4.  Acute on chronic CKD stage III -Avoid nephrotoxins -Creatinine 2.22--- fluids---  2.10--2.32--2.8 -Nephrology input appreciated  5.  DVT prophylaxis subcu heparin  PT recommends rehab. Pt will benefit given wound vac and need for changes   Case discussed with Care Management/Social Worker. Management plans discussed with the patient, family and they are in agreement.  CODE STATUS: Full  DVT Prophylaxis: Heparin  TOTAL TIME TAKING CARE OF THIS PATIENT: 25 minutes.  >50% time spent on counselling and coordination of care  POSSIBLE D/C IN  1-2 DAYS, DEPENDING ON CLINICAL CONDITION.  Note: This dictation was prepared with Dragon dictation along with smaller phrase technology. Any transcriptional errors that result from this process are unintentional.  Fritzi Mandes M.D on 10/06/2017 at 4:57 PM  Between 7am to 6pm - Pager - [phone]  After 6pm go to [email or web address] - password [email or web address] [facility]  Office  [phone]  CC: Primary care physician; Sallee Lange, NPPatient ID: [name], male   DOB: [birth year], 47 y.o.   MRN: [id number]

## 2017-10-07 ENCOUNTER — Encounter
Admission: EM | Disposition: A | Discharge status: Skilled Nursing Facility | Payer: Self-pay | Source: Home / Self Care | Attending: Internal Medicine

## 2017-10-07 ENCOUNTER — Inpatient Hospital Stay: Payer: BLUE CROSS/BLUE SHIELD | Admitting: Anesthesiology

## 2017-10-07 ENCOUNTER — Encounter: Payer: Self-pay | Admitting: Anesthesiology

## 2017-10-07 DIAGNOSIS — L03116 Cellulitis of left lower limb: Secondary | ICD-10-CM

## 2017-10-07 DIAGNOSIS — L02413 Cutaneous abscess of right upper limb: Secondary | ICD-10-CM

## 2017-10-07 DIAGNOSIS — L02416 Cutaneous abscess of left lower limb: Secondary | ICD-10-CM

## 2017-10-07 HISTORY — PX: INCISION AND DRAINAGE ABSCESS: SHX5864

## 2017-10-07 HISTORY — PX: APPLICATION OF WOUND VAC: SHX5189

## 2017-10-07 LAB — VANCOMYCIN, RANDOM: Vancomycin Rm: 19

## 2017-10-07 LAB — GLUCOSE, CAPILLARY
GLUCOSE-CAPILLARY: 87 mg/dL (ref 65–99)
GLUCOSE-CAPILLARY: 152 mg/dL — AB (ref 65–99)
Glucose-Capillary: 108 mg/dL — ABNORMAL HIGH (ref 65–99)
Glucose-Capillary: 157 mg/dL — ABNORMAL HIGH (ref 65–99)

## 2017-10-07 SURGERY — APPLICATION, WOUND VAC
Anesthesia: General | Site: Leg Upper | Laterality: Right | Wound class: Contaminated

## 2017-10-07 SURGERY — APPLICATION, WOUND VAC
Anesthesia: Choice | Laterality: Left

## 2017-10-07 MED ORDER — MIDAZOLAM HCL 2 MG/2ML IJ SOLN
INTRAMUSCULAR | Status: AC
  Filled 2017-10-07: qty 2

## 2017-10-07 MED ORDER — FENTANYL CITRATE (PF) 100 MCG/2ML IJ SOLN
INTRAMUSCULAR | Status: AC
  Filled 2017-10-07: qty 2

## 2017-10-07 MED ORDER — LIDOCAINE HCL (CARDIAC) 20 MG/ML IV SOLN
INTRAVENOUS | Status: DC | PRN
  Administered 2017-10-07: 100 mg via INTRAVENOUS

## 2017-10-07 MED ORDER — DEXMEDETOMIDINE HCL IN NACL 80 MCG/20ML IV SOLN
INTRAVENOUS | Status: AC
  Filled 2017-10-07: qty 20

## 2017-10-07 MED ORDER — OXYCODONE HCL 5 MG PO TABS: 5.0000 mg | ORAL_TABLET | Freq: Once | ORAL | Status: DC | PRN

## 2017-10-07 MED ORDER — SODIUM CHLORIDE 0.9 % IV SOLN
INTRAVENOUS | Status: DC
  Administered 2017-10-07: 12:00:00 via INTRAVENOUS

## 2017-10-07 MED ORDER — HYDROMORPHONE HCL 1 MG/ML IJ SOLN
INTRAMUSCULAR | Status: DC | PRN
  Administered 2017-10-07: .4 mg via INTRAVENOUS

## 2017-10-07 MED ORDER — PROPOFOL 10 MG/ML IV BOLUS
INTRAVENOUS | Status: AC
  Filled 2017-10-07: qty 20

## 2017-10-07 MED ORDER — PROPOFOL 10 MG/ML IV BOLUS
INTRAVENOUS | Status: DC | PRN
  Administered 2017-10-07: 200 mg via INTRAVENOUS

## 2017-10-07 MED ORDER — ONDANSETRON HCL 4 MG/2ML IJ SOLN
INTRAMUSCULAR | Status: DC | PRN
  Administered 2017-10-07: 4 mg via INTRAVENOUS

## 2017-10-07 MED ORDER — ONDANSETRON HCL 4 MG/2ML IJ SOLN
INTRAMUSCULAR | Status: AC
  Filled 2017-10-07: qty 2

## 2017-10-07 MED ORDER — MIDAZOLAM HCL 2 MG/2ML IJ SOLN
INTRAMUSCULAR | Status: DC | PRN
  Administered 2017-10-07: 2 mg via INTRAVENOUS

## 2017-10-07 MED ORDER — SUCCINYLCHOLINE CHLORIDE 20 MG/ML IJ SOLN
INTRAMUSCULAR | Status: DC | PRN
  Administered 2017-10-07: 200 mg via INTRAVENOUS

## 2017-10-07 MED ORDER — SUGAMMADEX SODIUM 200 MG/2ML IV SOLN
INTRAVENOUS | Status: DC | PRN
  Administered 2017-10-07 (×2): 200 mg via INTRAVENOUS

## 2017-10-07 MED ORDER — FENTANYL CITRATE (PF) 100 MCG/2ML IJ SOLN
25.0000 ug | INTRAMUSCULAR | Status: DC | PRN
  Administered 2017-10-07 (×2): 50 ug via INTRAVENOUS

## 2017-10-07 MED ORDER — HYDROMORPHONE HCL 1 MG/ML IJ SOLN
INTRAMUSCULAR | Status: AC
  Filled 2017-10-07: qty 1

## 2017-10-07 MED ORDER — LACTATED RINGERS IV SOLN
INTRAVENOUS | Status: DC | PRN
  Administered 2017-10-07: 08:00:00 via INTRAVENOUS

## 2017-10-07 MED ORDER — VANCOMYCIN HCL 10 G IV SOLR
1500.0000 mg | Freq: Once | INTRAVENOUS | Status: AC
  Administered 2017-10-07: 1500 mg via INTRAVENOUS
  Filled 2017-10-07: qty 1500

## 2017-10-07 MED ORDER — OXYCODONE HCL 5 MG/5ML PO SOLN: 5.0000 mg | Freq: Once | ORAL | Status: DC | PRN

## 2017-10-07 MED ORDER — FENTANYL CITRATE (PF) 100 MCG/2ML IJ SOLN
INTRAMUSCULAR | Status: DC | PRN
  Administered 2017-10-07 (×2): 50 ug via INTRAVENOUS

## 2017-10-07 MED ORDER — PHENYLEPHRINE HCL 10 MG/ML IJ SOLN
INTRAMUSCULAR | Status: DC | PRN
  Administered 2017-10-07: 100 ug via INTRAVENOUS

## 2017-10-07 MED ORDER — ROCURONIUM BROMIDE 100 MG/10ML IV SOLN
INTRAVENOUS | Status: DC | PRN
  Administered 2017-10-07: 50 mg via INTRAVENOUS

## 2017-10-07 SURGICAL SUPPLY — 20 items
BENZOIN TINCTURE PRP APPL 2/3 (GAUZE/BANDAGES/DRESSINGS) ×8 IMPLANT
CANISTER SUCT 1200ML W/VALVE (MISCELLANEOUS) ×4 IMPLANT
DRAPE INCISE IOBAN 66X45 STRL (DRAPES) ×4 IMPLANT
DRAPE LAPAROTOMY T 102X78X121 (DRAPES) ×4 IMPLANT
DRSG VAC ATS MED SENSATRAC (GAUZE/BANDAGES/DRESSINGS) ×4 IMPLANT
ELECT REM PT RETURN 9FT ADLT (ELECTROSURGICAL) ×4
ELECTRODE REM PT RTRN 9FT ADLT (ELECTROSURGICAL) ×2 IMPLANT
GLOVE BIO SURGEON STRL SZ7 (GLOVE) ×12 IMPLANT
GOWN STRL REUS W/ TWL LRG LVL3 (GOWN DISPOSABLE) ×4 IMPLANT
GOWN STRL REUS W/ TWL XL LVL3 (GOWN DISPOSABLE) ×2 IMPLANT
GOWN STRL REUS W/TWL LRG LVL3 (GOWN DISPOSABLE) ×4
GOWN STRL REUS W/TWL XL LVL3 (GOWN DISPOSABLE) ×2
KIT TURNOVER KIT A (KITS) ×4 IMPLANT
NS IRRIG 1000ML POUR BTL (IV SOLUTION) ×4 IMPLANT
PACK BASIN MINOR ARMC (MISCELLANEOUS) ×4 IMPLANT
SOL PREP PVP 2OZ (MISCELLANEOUS) ×4
SOLUTION PREP PVP 2OZ (MISCELLANEOUS) ×2 IMPLANT
SPONGE LAP 18X18 5 PK (GAUZE/BANDAGES/DRESSINGS) ×4 IMPLANT
STOCKINETTE STRL 6IN 960660 (GAUZE/BANDAGES/DRESSINGS) ×4 IMPLANT
WND VAC CANISTER 500ML (MISCELLANEOUS) ×4 IMPLANT

## 2017-10-07 SURGICAL SUPPLY — 16 items
CANISTER SUCT 1200ML W/VALVE (MISCELLANEOUS) ×3 IMPLANT
DRAPE INCISE IOBAN 66X45 STRL (DRAPES) ×3 IMPLANT
DRAPE LAPAROTOMY T 102X78X121 (DRAPES) ×3 IMPLANT
ELECT REM PT RETURN 9FT ADLT (ELECTROSURGICAL) ×3
ELECTRODE REM PT RTRN 9FT ADLT (ELECTROSURGICAL) ×1 IMPLANT
GLOVE BIO SURGEON STRL SZ7 (GLOVE) ×3 IMPLANT
GOWN STRL REUS W/ TWL LRG LVL3 (GOWN DISPOSABLE) ×1 IMPLANT
GOWN STRL REUS W/ TWL XL LVL3 (GOWN DISPOSABLE) ×1 IMPLANT
GOWN STRL REUS W/TWL LRG LVL3 (GOWN DISPOSABLE) ×2
GOWN STRL REUS W/TWL XL LVL3 (GOWN DISPOSABLE) ×2
KIT TURNOVER KIT A (KITS) ×3 IMPLANT
NS IRRIG 1000ML POUR BTL (IV SOLUTION) ×3 IMPLANT
PACK BASIN MINOR ARMC (MISCELLANEOUS) ×3 IMPLANT
SOL PREP PVP 2OZ (MISCELLANEOUS) ×3
SOLUTION PREP PVP 2OZ (MISCELLANEOUS) ×1 IMPLANT
SPONGE LAP 18X18 5 PK (GAUZE/BANDAGES/DRESSINGS) ×3 IMPLANT

## 2017-10-07 NOTE — Op Note (Signed)
OPERATIVE NOTE   PROCEDURE: 1.  Incision and drainage right proximal thigh abscess 2.  Excisional debridement left leg wound. 3.  Replacement of VAC dressing left leg wound  PRE-OPERATIVE DIAGNOSIS: Abscess right thigh proximally; abscess left posterior ankle  POST-OPERATIVE DIAGNOSIS: Same  SURGEON: [name]  ASSISTANT(S): Ms. Hezzie Bump  ANESTHESIA: general  ESTIMATED BLOOD LOSS: 25 cc  FINDING(S): Abscess was identified in the proximal thigh.  By ultrasound there appeared to be a 1 x 2 cm area of fluid underneath the indurated skin.  VAC is removed from the left ankle wound the wound is debrided and irrigated measurements following debridement are as follows the open wound is a 7 cm x 7 cm circular wound there is 4 cm of undermining medially there is 3 cm of undermining laterally and there is 7 cm of undermining superiorly  SPECIMEN(S): Culture of the right thigh abscess purulent material sent to micro  INDICATIONS:   [name] is a 47 y.o. male who presents for wound VAC change and further debridement as needed of the left posterior ankle wound.  In the preoperative area the patient asked me to evaluate a painful spot on his right medial thigh proximally.  By examination this appeared to be indurated but not fluctuant.  In discussion with the patient we agreed the preoperatively I would ultrasound this area and if fluid was identified I would incise and drain this as well.  The risks and benefits for both procedures were reviewed and the patient had has agreed to proceed.  DESCRIPTION: After full informed written consent was obtained from the patient, the patient was brought back to the operating room and placed supine upon the operating table.  Prior to induction, the patient received IV antibiotics.   After obtaining adequate anesthesia, the patient was then evaluated with ultrasound in the medial proximal right thigh.  A fluid collection measuring  approximately 1 x 2 cm was identified underneath the indurated area.  As agreed upon and discussed with the patient preoperatively I therefore moved forward with incision and drainage.  The right thigh was prepped and draped in the standard fashion for a I&D of the abscess.  Limb blade was then used to make a 1 cm incision in the center of this area.  A hemostat was then inserted and spread and 5-6 cc of yellow creamy purulent material was encountered.  Culture was obtained.  The wound was then irrigated and 1/2 inch iodoform ribbon was packed into the wound and the wound was covered with ABDs.  The patient was then moved from the bed and positioned prone on the OR table.  After his airway had been secured and he was positioned appropriately the [facility] dressing was removed from his left posterior ankle.  The left posterior ankle and wound was then prepped and draped in sterile fashion.  Excisional debridement was then carried out using a 15 blade scalpel as well as a curette.  Metzenbaum scissors were also used to debride necrotic tissue.  Both skin subcutaneous tissue and some tendon was debrided.  The wound was then irrigated with 1/2 L saline.  As noted above the wound is 7 x 7 cm circular there is 4 cm of undermining medially 3 cm of undermining laterally and 7 cm of underlying superiorly.  A medium VAC sponge was then packed into the wound and the VAC dressing completed.  Excellent seal was noted.  The patient was then repositioned to the supine in  his hospital bed without difficulty.   The patient tolerated this procedure well.   COMPLICATIONS: None  CONDITION: [name] [name]  Office: [phone]   10/07/2017, 9:15 AM

## 2017-10-07 NOTE — Progress Notes (Signed)
Pt transported to OR

## 2017-10-07 NOTE — Transfer of Care (Signed)
Immediate Anesthesia Transfer of Care Note  Patient: [name]  Procedure(s) Performed: APPLICATION OF WOUND VAC (Left ) INCISION AND DRAINAGE ABSCESS (Right Leg Upper)  Patient Location: PACU  Anesthesia Type:General  Level of Consciousness: sedated  Airway & Oxygen Therapy: Patient Spontanous Breathing and Patient connected to face mask oxygen  Post-op Assessment: Report given to RN and Post -op Vital signs reviewed and stable  Post vital signs: Reviewed and stable  Last Vitals:  Vitals:   10/06/17 1954 10/07/17 0936  BP: (!) 151/81   Pulse: 72   Resp: 19   Temp: 37.1 C (!) 36.3 C  SpO2: 99%     Last Pain:  Vitals:   10/07/17 0143  TempSrc:   PainSc: Asleep         Complications: No apparent anesthesia complications

## 2017-10-07 NOTE — H&P (Signed)
[facility] History & Physical Update  The patient was interviewed and re-examined.  The patient's previous History and Physical has been reviewed and is unchanged.  There is no change in the plan of care. We plan to proceed with the scheduled procedure.  [name], MD  10/07/2017, 7:32 AM

## 2017-10-07 NOTE — Anesthesia Procedure Notes (Signed)
Procedure Name: Intubation Date/Time: 10/07/2017 8:10 AM Performed by: Justus Memory, CRNA Pre-anesthesia Checklist: Patient identified, Patient being monitored, Timeout performed, Emergency Drugs available and Suction available Patient Re-evaluated:Patient Re-evaluated prior to induction Oxygen Delivery Method: Circle system utilized Preoxygenation: Pre-oxygenation with 100% oxygen Induction Type: IV induction Ventilation: Mask ventilation without difficulty Laryngoscope Size: McGraph and 4 (used electively b/c of hospiotal bed) Grade View: Grade I Tube type: Oral Tube size: 7.5 mm Number of attempts: 1 Airway Equipment and Method: Stylet Placement Confirmation: ETT inserted through vocal cords under direct vision,  positive ETCO2 and breath sounds checked- equal and bilateral Secured at: 23 cm Tube secured with: Tape Dental Injury: Teeth and Oropharynx as per pre-operative assessment  Difficulty Due To: Difficult Airway- due to large tongue and Difficult Airway- due to limited oral opening

## 2017-10-07 NOTE — Progress Notes (Signed)
Pt arrived back to room 138. Skin assessment completed with Tiffany. IV infusing. Pt on 2L O2. Wound vac in place. Pt denies any pain at this time. Family at bedside.

## 2017-10-07 NOTE — Progress Notes (Signed)
[place], [place] 10/07/17  Subjective:   Patient is doing fair.  No acute complaints Left leg wound excisional debridement and replacement of wound VAC done today Patient denies any shortness of breath Serum creatinine 2.71 from yesterday  Objective:  Vital signs in last 24 hours:  Temp:  [97.3 F (36.3 C)-99 F (37.2 C)] 97.7 F (36.5 C) (02/08 1056) Pulse Rate:  [65-72] 66 (02/08 1202) Resp:  [15-19] 18 (02/08 1202) BP: (143-155)/(71-94) 143/76 (02/08 1202) SpO2:  [93 %-100 %] 99 % (02/08 1202)  Weight change:  Filed Weights   10/04/17 0428 10/05/17 0623 10/06/17 0313  Weight: (!) 184.3 kg (406 lb 3.2 oz) (!) 182.4 kg (402 lb 1.6 oz) (!) 182.5 kg (402 lb 6.4 oz)    Intake/Output:    Intake/Output Summary (Last 24 hours) at 10/07/2017 1623 Last data filed at 10/07/2017 1406 Gross per 24 hour  Intake 690 ml  Output 1106 ml  Net -416 ml     Physical Exam: General: Normocephalic and atraumatic.   HEENT Mucous membranes moist.   Neck Nontender.   Pulm/lungs Lungs clear to auscultation bilaterally. No wheezing.   CVS/Heart Regular rate and rhythm. No murmurs, rubs, gallops.   Abdomen:  Soft and nontender.   Extremities: Increasing edema on rt leg 2+  Neurologic: AAOX3. Speech and affect normal.  Skin: LLE edema with erythema; wound vac on left heel          Basic Metabolic Panel:  Recent Labs  Lab 10/01/17 1035 10/01/17 1333 10/02/17 0439 10/03/17 0130 10/05/17 0305 10/06/17 0557  NA 131*  --  133* 136 136 139  K 4.1  --  3.8 3.7 3.9 4.1  CL 97*  --  102 106 107 108  CO2 25  --  23 19* 23 23  GLUCOSE 477*  --  386* 253* 189* 131*  BUN 56*  --  56* 53* 52* 46*  CREATININE 2.13* 2.22* 2.10* 2.32* 2.81* 2.71*  CALCIUM 8.8*  --  8.3* 8.4* 8.1* 8.5*  MG  --   --   --   --   --  2.0  PHOS  --   --   --  3.7 3.9  --      CBC: Recent Labs  Lab 10/01/17 1035 10/01/17 1333 10/02/17 0439 10/06/17 0557  WBC 17.1* 17.0*  13.3* 9.1  HGB 12.0* 11.3* 11.0* 9.6*  HCT 38.1* 35.3* 34.3* 29.3*  MCV 89.0 88.2 89.1 88.9  PLT 365 312 281 290     No results found for: HEPBSAG, HEPBSAB, HEPBIGM    Microbiology:  Recent Results (from the past 240 hour(s))  Blood Culture (routine x 2)     Status: None   Collection Time: 10/01/17 10:41 AM  Result Value Ref Range Status   Specimen Description BLOOD LEFT ANTECUBITAL  Final   Special Requests   Final    BOTTLES DRAWN AEROBIC AND ANAEROBIC Blood Culture results may not be optimal due to an excessive volume of blood received in culture bottles   Culture   Final    NO GROWTH 5 DAYS Performed at [facility], [address] [place], [place] [address]    Report Status 10/06/2017 FINAL  Final  Blood Culture (routine x 2)     Status: None   Collection Time: 10/01/17 11:31 AM  Result Value Ref Range Status   Specimen Description BLOOD BLOOD RIGHT ARM  Final   Special Requests   Final  BOTTLES DRAWN AEROBIC AND ANAEROBIC Blood Culture adequate volume   Culture   Final    NO GROWTH 5 DAYS Performed at [facility], [address] [place], [place] [address]    Report Status 10/06/2017 FINAL  Final  MRSA PCR Screening     Status: None   Collection Time: 10/03/17  8:02 AM  Result Value Ref Range Status   MRSA by PCR NEGATIVE NEGATIVE Final    Comment:        The GeneXpert MRSA Assay (FDA approved for NASAL specimens only), is one component of a comprehensive MRSA colonization surveillance program. It is not intended to diagnose MRSA infection nor to guide or monitor treatment for MRSA infections. Performed at [facility], [address] [place], [place] [address]   Aerobic/Anaerobic Culture (surgical/deep wound)     Status: None (Preliminary result)   Collection Time: 10/07/17  8:25 AM  Result Value Ref Range Status   Specimen Description   Final    WOUND Performed at [facility], [address],  [place], [place] [address]    Special Requests   Final    PUS FROM R LEG ABSCESS Performed at [facility], [address] [place], [place] [address]    Gram Stain   Final    MODERATE WBC PRESENT,BOTH PMN AND MONONUCLEAR FEW SQUAMOUS EPITHELIAL CELLS PRESENT FEW GRAM POSITIVE COCCI IN CLUSTERS Performed at [facility], [place] [address], [place], [place] [address]    Culture PENDING  Incomplete   Report Status PENDING  Incomplete    Coagulation Studies: Recent Labs    10/06/17 0557  LABPROT 14.7  INR 1.16    Urinalysis: No results for input(s): COLORURINE, LABSPEC, PHURINE, GLUCOSEU, HGBUR, BILIRUBINUR, KETONESUR, PROTEINUR, UROBILINOGEN, NITRITE, LEUKOCYTESUR in the last 72 hours.  Invalid input(s): APPERANCEUR    Imaging: No results found.   Medications:   . piperacillin-tazobactam (ZOSYN)  IV 3.375 g (10/07/17 1435)   . amLODipine  10 mg Oral Daily  . atenolol  25 mg Oral Daily  . docusate sodium  100 mg Oral BID  . enoxaparin (LOVENOX) injection  40 mg Subcutaneous BID  . feeding supplement (NEPRO CARB STEADY)  237 mL Oral BID BM  . fentaNYL      . fentaNYL      . Influenza vac split quadrivalent PF  0.5 mL Intramuscular Tomorrow-1000  . insulin aspart  0-15 Units Subcutaneous TID WC  . insulin aspart  2 Units Subcutaneous TID WC  . insulin glargine  17 Units Subcutaneous BID  . pneumococcal 23 valent vaccine  0.5 mL Intramuscular Tomorrow-1000   acetaminophen **OR** acetaminophen, bisacodyl, morphine injection, ondansetron **OR** ondansetron (ZOFRAN) IV, oxyCODONE-acetaminophen  Assessment/ Plan:  47 y.o. black male with diabetes mellitus type II, diabetic retinopathy and neuropathy, hypertension, obesity, venous insufficiency, angioedema with ACE-I who was admitted to [facility] 10/01/2017 for left leg abscess and cellulitis.  1. Acute Renal Failure on Chronic Kidney Disease Stage III with Proteinuria - Baseline = Creatinine: 1.6; GFR= 58 (8/18) - CKD  secondary to Diabetic Nephropathy. A1C 15.1 (10/01/17) - Acute renal failure with active infection of cellulitis. Urinalysis and protein to creatinine ratio 0.82. Normal renal ultrasound.  -Creatinine trends are as follows 2.10 ->2.32->2.81->2.71 (2/7) - Oral intake is adequate,    -Albumin 1.7 (2/3-2/6) - Continue to monitor renal function closely  2. Hypertension with LE edema  - Continue holding HCTZ. BP control is acceptable  3. DM2 with CKD and  insulin dependent. -Hemoglobin A1C 15.1%.    -Allergy to ACE-I -Hold metformin d/t ARF  4. LLE cellulitis.Abx as per IM team  -Surgical debridement (10/03/17) - Wound vac changed Friday (2/8)   LOS: [place] 2/8/20194:23 PM  [place], [place]

## 2017-10-07 NOTE — Progress Notes (Signed)
[place] at [place] NAME: [name]    MR#:  [id number]  DATE OF BIRTH:  02/10/1971  SUBJECTIVE:  Patient is status post wound VAC change in the OR by Dr. Delana Meyer today.  REVIEW OF SYSTEMS:   Review of Systems  Constitutional: Negative for chills, fever and weight loss.  HENT: Negative for ear discharge, ear pain and nosebleeds.   Eyes: Negative for blurred vision, pain and discharge.  Respiratory: Negative for sputum production, shortness of breath, wheezing and stridor.   Cardiovascular: Negative for chest pain, palpitations, orthopnea and PND.  Gastrointestinal: Negative for abdominal pain, diarrhea, nausea and vomiting.  Genitourinary: Negative for frequency and urgency.  Musculoskeletal: Negative for back pain and joint pain.  Neurological: Positive for weakness. Negative for sensory change, speech change and focal weakness.  Psychiatric/Behavioral: Negative for depression and hallucinations. The patient is not nervous/anxious.    Tolerating Diet:yes Tolerating PT: Rehab  DRUG ALLERGIES:   Allergies  Allergen Reactions  . Lisinopril Swelling    VITALS:  Blood pressure (!) 143/76, pulse 66, temperature 97.7 F (36.5 C), temperature source Oral, resp. rate 18, height 6\' 4"  (1.93 m), weight (!) 182.5 kg (402 lb 6.4 oz), SpO2 99 %.  PHYSICAL EXAMINATION:   Physical Exam  GENERAL:  47 y.o.-year-old patient lying in the bed with no acute distress.  EYES: Pupils equal, round, reactive to light and accommodation. No scleral icterus. Extraocular muscles intact.  HEENT: Head atraumatic, normocephalic. Oropharynx and nasopharynx clear.  NECK:  Supple, no jugular venous distention. No thyroid enlargement, no tenderness.  LUNGS: Normal breath sounds bilaterally, no wheezing, rales, rhonchi. No use of accessory muscles of respiration.  CARDIOVASCULAR: S1, S2 normal. No murmurs, rubs, or gallops.  ABDOMEN: Soft, nontender,  nondistended. Bowel sounds present. No organomegaly or mass.  EXTREMITIES: Left leg edema with wound vac+ NEUROLOGIC: Cranial nerves II through XII are intact. No focal Motor or sensory deficits b/l.   PSYCHIATRIC:  patient is alert and oriented x 3.  SKIN: No obvious rash, lesion, or ulcer.   LABORATORY PANEL:  CBC Recent Labs  Lab 10/06/17 0557  WBC 9.1  HGB 9.6*  HCT 29.3*  PLT 290    Chemistries  Recent Labs  Lab 10/02/17 0439  10/06/17 0557  NA 133*   < > 139  K 3.8   < > 4.1  CL 102   < > 108  CO2 23   < > 23  GLUCOSE 386*   < > 131*  BUN 56*   < > 46*  CREATININE 2.10*   < > 2.71*  CALCIUM 8.3*   < > 8.5*  MG  --   --  2.0  AST 18  --   --   ALT 15*  --   --   ALKPHOS 112  --   --   BILITOT 0.9  --   --    < > = values in this interval not displayed.   Cardiac Enzymes No results for input(s): TROPONINI in the last 168 hours. RADIOLOGY:  No results found. ASSESSMENT AND PLAN:  [name] is a 47 y.o. male has a past medical history significant for HTN and DM recently seen by [name] for LLE edema now with worsening pain and swelling of LLE with bloody discharge. Also c/o worsening hyperglycemia and nausea. No fever. Denies CP or SOB.   1. Left lower extremity cellulitis/necrotic area with severe chronic venous stasis  dermatitis and venous ulceration. -Appreciate Dr. Nino Parsley input -On empirically IV Zosyn and vanc -Blood cultures negative.  White count 17,000--- 13,000 -s/p I and d and debridement of left leg abscess/necrotic area  Wound vac + -Wound VAC change per Dr. Delana Meyer on 10/07/2017--- per vascular patient will need couple more wound VAC changes in the OR given the extent and severity of the wound. Follow-up wound cultures that were sent on 10/07/2017-  2.Uncontrolled type 2 diabetes -insulin Lantus 17 unitstwice a day and do meal coverage with insulin aspart and sliding scale -DC metformin given elevated creatinine  3.Hypertension   -continue home meds  4.  Acute on chronic CKD stage III -Avoid nephrotoxins -Creatinine 2.22--- fluids--- 2.10--2.32--2.8 -Nephrology input appreciated  5.  DVT prophylaxis subcu heparin  PT recommends rehab. Pt will benefit given wound vac and need for changes   Case discussed with Care Management/Social Worker. Management plans discussed with the patient, family and they are in agreement.  CODE STATUS: Full  DVT Prophylaxis: Heparin  TOTAL TIME TAKING CARE OF THIS PATIENT: 25 minutes.  >50% time spent on counselling and coordination of care  POSSIBLE D/C unclear DAYS, DEPENDING ON CLINICAL CONDITION number of wound VAC changes in the OR needed.  Note: This dictation was prepared with Dragon dictation along with smaller phrase technology. Any transcriptional errors that result from this process are unintentional.  Fritzi Mandes M.D on 10/07/2017 at 2:26 PM  Between 7am to 6pm - Pager - [phone]  After 6pm go to [email or web address] - password [email or web address] [facility]  Office  [phone]  CC: Primary care physician; Sallee Lange, NPPatient ID: [name], male   DOB: [birth year], 47 y.o.   MRN: [id number]

## 2017-10-07 NOTE — Anesthesia Postprocedure Evaluation (Signed)
Anesthesia Post Note  Patient: [name]  Procedure(s) Performed: APPLICATION OF WOUND VAC (Left ) INCISION AND DRAINAGE ABSCESS (Right Leg Upper)  Patient location during evaluation: PACU Anesthesia Type: General Level of consciousness: awake and alert Pain management: pain level controlled Vital Signs Assessment: post-procedure vital signs reviewed and stable Respiratory status: spontaneous breathing, nonlabored ventilation, respiratory function stable and patient connected to nasal cannula oxygen Cardiovascular status: blood pressure returned to baseline and stable Postop Assessment: no apparent nausea or vomiting Anesthetic complications: no     Last Vitals:  Vitals:   10/07/17 1010 10/07/17 1021  BP:  (!) 150/77  Pulse:    Resp:    Temp:  36.6 C  SpO2: 93%     Last Pain:  Vitals:   10/07/17 1005  TempSrc:   PainSc: 3                  Precious Haws Milani Lowenstein

## 2017-10-07 NOTE — Anesthesia Preprocedure Evaluation (Signed)
Anesthesia Evaluation  Patient identified by MRN, date of birth, ID band Patient awake    Reviewed: Allergy & Precautions, H&P , NPO status , Patient's Chart, lab work & pertinent test results  History of Anesthesia Complications Negative for: history of anesthetic complications  Airway Mallampati: II  TM Distance: >3 FB Neck ROM: full    Dental  (+) Chipped, Poor Dentition, Missing   Pulmonary neg shortness of breath,           Cardiovascular Exercise Tolerance: Good hypertension, (-) angina+ Peripheral Vascular Disease  (-) DOE      Neuro/Psych negative neurological ROS  negative psych ROS   GI/Hepatic negative GI ROS, Neg liver ROS, neg GERD  ,  Endo/Other  diabetes, Poorly Controlled, Type 2, Insulin Dependent  Renal/GU      Musculoskeletal   Abdominal   Peds  Hematology negative hematology ROS (+)   Anesthesia Other Findings Past Medical History: No date: Diabetes mellitus without complication (HCC) No date: Hypertension No date: Microalbuminuria No date: Vitamin D deficiency  Past Surgical History: 09/06/8414: APPLICATION OF WOUND VAC; Left     Comment:  Procedure: APPLICATION OF WOUND VAC;  Surgeon: Algernon Huxley, MD;  Location: [facility] ORS;  Service: General;                Laterality: Left; 10/03/2017: IRRIGATION AND DEBRIDEMENT ABSCESS; Left     Comment:  Procedure: IRRIGATION AND DEBRIDEMENT ABSCESS with               debridement of skin, soft tissue, muscle 50sq cm;                Surgeon: Algernon Huxley, MD;  Location: [facility] ORS;  Service:              General;  Laterality: Left;  BMI    Body Mass Index:  48.98 kg/m      Reproductive/Obstetrics negative OB ROS                             Anesthesia Physical Anesthesia Plan  ASA: III  Anesthesia Plan: General ETT   Post-op Pain Management:    Induction: Intravenous  PONV Risk Score and Plan:  Ondansetron, Dexamethasone, Midazolam and Treatment may vary due to age or medical condition  Airway Management Planned: Oral ETT  Additional Equipment:   Intra-op Plan:   Post-operative Plan: Extubation in OR  Informed Consent: I have reviewed the patients History and Physical, chart, labs and discussed the procedure including the risks, benefits and alternatives for the proposed anesthesia with the patient or authorized representative who has indicated his/her understanding and acceptance.   Dental Advisory Given  Plan Discussed with: Anesthesiologist, CRNA and Surgeon  Anesthesia Plan Comments: (Patient consented for risks of anesthesia including but not limited to:  - adverse reactions to medications - damage to teeth, lips or other oral mucosa - sore throat or hoarseness - Damage to heart, brain, lungs or loss of life  Patient voiced understanding.)        Anesthesia Quick Evaluation

## 2017-10-07 NOTE — Progress Notes (Signed)
Pharmacy Antibiotic Note  [name] is a 47 y.o. male admitted on 10/01/2017 with cellulitis.  Pharmacy has been consulted for zosyn and vancomycin dosing.  Plan: Vancomycin 1500mg  IV every 12 hours.  Goal trough 10-15 mcg/mL. Zosyn 3.375g IV q8h (4 hour infusion).   02/04 @ 0130 VT 36 supratherapeutic drawn appropriately. Scr rising, will hold off vanc for now and will check a random level in 12 hours ~ patient expected to be around 18 mcg/mL in 12 hours. Will reassess regimen once level < 20 mcg/mL  02/04 1642 Vanc random 24. Level still elevated. Will order Vanc random for 2/5 0130 (24 hours after trough level).   02/05 0200 vanc level 18. Will give one time dose of 1500 mg and recheck random vanc with 2/6 AM labs.  02/06 AM vanc level 23. No dose ordered. Recheck random vanc tomorrow AM.  2/7 AM vanc level 16. 1500 mg x1 ordered. Calculated kei 0.13 hr-1, t1/2 50 hours. Recheck vanc random tomorrow AM .  2/8 AM vanc 19. Will give 1x dose of 1500 mg and recheck random level with tomorrow AM labs. SCr also in AM.  Height: 6\' 4"  (193 cm) Weight: (!) 402 lb 6.4 oz (182.5 kg) IBW/kg (Calculated) : 86.8  Temp (24hrs), Avg:98.8 F (37.1 C), Min:98.5 F (36.9 C), Max:99 F (37.2 C)  Recent Labs  Lab 10/01/17 1035 10/01/17 1333 10/02/17 0439 10/03/17 0130  10/05/17 0305 10/06/17 0557 10/07/17 0343  WBC 17.1* 17.0* 13.3*  --   --   --  9.1  --   CREATININE 2.13* 2.22* 2.10* 2.32*  --  2.81* 2.71*  --   LATICACIDVEN 1.8 2.1*  --   --   --   --   --   --   VANCOTROUGH  --   --   --  36*  --   --   --   --   VANCORANDOM  --   --   --   --    < > 23 16 19    < > = values in this interval not displayed.    Estimated Creatinine Clearance: 59.6 mL/min (A) (by C-G formula based on SCr of 2.71 mg/dL (H)).    Allergies  Allergen Reactions  . Lisinopril Swelling    Antimicrobials this admission: Anti-infectives (From admission, onward)   Start     Dose/Rate Route Frequency  Ordered Stop   10/07/17 0600  vancomycin (VANCOCIN) 1,500 mg in sodium chloride 0.9 % 500 mL IVPB     1,500 mg 250 mL/hr over 120 Minutes Intravenous  Once 10/07/17 0556     10/06/17 0800  vancomycin (VANCOCIN) 1,500 mg in sodium chloride 0.9 % 500 mL IVPB     1,500 mg 250 mL/hr over 120 Minutes Intravenous  Once 10/06/17 0715 10/06/17 1054   10/04/17 0600  vancomycin (VANCOCIN) 1,500 mg in sodium chloride 0.9 % 500 mL IVPB     1,500 mg 250 mL/hr over 120 Minutes Intravenous  Once 10/04/17 0426 10/04/17 1303   10/03/17 1330  ceFAZolin (ANCEF) IVPB 1 g/50 mL premix     1 g 100 mL/hr over 30 Minutes Intravenous  Once 10/03/17 1323 10/03/17 1450   10/03/17 1320  ceFAZolin (ANCEF) 1-4 GM/50ML-% IVPB    Comments:  Machia, Millissa   : cabinet override      10/03/17 1320 10/03/17 1420   10/01/17 1800  piperacillin-tazobactam (ZOSYN) IVPB 3.375 g     3.375 g 12.5 mL/hr over 240  Minutes Intravenous Every 8 hours 10/01/17 1133     10/01/17 1400  vancomycin (VANCOCIN) 1,500 mg in sodium chloride 0.9 % 500 mL IVPB  Status:  Discontinued     1,500 mg 250 mL/hr over 120 Minutes Intravenous Every 12 hours 10/01/17 1156 10/03/17 0203   10/01/17 1130  piperacillin-tazobactam (ZOSYN) IVPB 3.375 g     3.375 g 100 mL/hr over 30 Minutes Intravenous  Once 10/01/17 1120 10/01/17 1211   10/01/17 1130  vancomycin (VANCOCIN) IVPB 1000 mg/200 mL premix     1,000 mg 200 mL/hr over 60 Minutes Intravenous  Once 10/01/17 1120 10/01/17 1300      Microbiology results: Recent Results (from the past 240 hour(s))  Blood Culture (routine x 2)     Status: None   Collection Time: 10/01/17 10:41 AM  Result Value Ref Range Status   Specimen Description BLOOD LEFT ANTECUBITAL  Final   Special Requests   Final    BOTTLES DRAWN AEROBIC AND ANAEROBIC Blood Culture results may not be optimal due to an excessive volume of blood received in culture bottles   Culture   Final    NO GROWTH 5 DAYS Performed at [facility], [address] [place], [place] [address]    Report Status 10/06/2017 FINAL  Final  Blood Culture (routine x 2)     Status: None   Collection Time: 10/01/17 11:31 AM  Result Value Ref Range Status   Specimen Description BLOOD BLOOD RIGHT ARM  Final   Special Requests   Final    BOTTLES DRAWN AEROBIC AND ANAEROBIC Blood Culture adequate volume   Culture   Final    NO GROWTH 5 DAYS Performed at [facility], [address] [place], [place] [address]    Report Status 10/06/2017 FINAL  Final  MRSA PCR Screening     Status: None   Collection Time: 10/03/17  8:02 AM  Result Value Ref Range Status   MRSA by PCR NEGATIVE NEGATIVE Final    Comment:        The GeneXpert MRSA Assay (FDA approved for NASAL specimens only), is one component of a comprehensive MRSA colonization surveillance program. It is not intended to diagnose MRSA infection nor to guide or monitor treatment for MRSA infections. Performed at [facility], [address] [place], [place] [address]    Thank you for allowing pharmacy to be a part of this patient's care.  Eloise Harman, PharmD, BCPS Clinical Pharmacist 10/07/2017 5:56 AM

## 2017-10-07 NOTE — Progress Notes (Signed)
Chaplain engaged the patient prior to surgery.  Patient is concerned about his family/mother and notably, his father's health.  Patient is trying to be positive, despite uncertainty about his health. Patient talked about his faith and growing in his understanding. Chaplain prayed with patient.

## 2017-10-07 NOTE — Anesthesia Post-op Follow-up Note (Signed)
Anesthesia QCDR form completed.        

## 2017-10-07 NOTE — Progress Notes (Signed)
Per [facility] admissions coordinator at [facility] authorization is good through Tuesday 10/11/17. If patient does not dischaege to [facility] by that time then a new [employer] authorization will have to be started.  [employer], LCSW [phone]

## 2017-10-07 NOTE — Progress Notes (Signed)
PT Cancellation Note  Patient Details Name: [name] MRN: [id number] DOB: 1971/05/01   Cancelled Treatment:    Reason Eval/Treat Not Completed: Patient at procedure or test/unavailable Pt with wound vac change and I&D earlier, had general anesthesia.  Will need new orders when appropriate. Orders will be completed.  Kreg Shropshire, DPT 10/07/2017, 4:40 PM

## 2017-10-08 LAB — GLUCOSE, CAPILLARY
GLUCOSE-CAPILLARY: 159 mg/dL — AB (ref 65–99)
GLUCOSE-CAPILLARY: 118 mg/dL — AB (ref 65–99)
Glucose-Capillary: 105 mg/dL — ABNORMAL HIGH (ref 65–99)
Glucose-Capillary: 148 mg/dL — ABNORMAL HIGH (ref 65–99)
Glucose-Capillary: 106 mg/dL — ABNORMAL HIGH (ref 65–99)

## 2017-10-08 LAB — CREATININE, SERUM
CREATININE: 2.85 mg/dL — AB (ref 0.61–1.24)
GFR calc non Af Amer: 25 mL/min — ABNORMAL LOW (ref >60)
GFR, EST AFRICAN AMERICAN: 29 mL/min — AB (ref >60)

## 2017-10-08 LAB — VANCOMYCIN, RANDOM: VANCOMYCIN RM: 27

## 2017-10-08 MED ORDER — HEPARIN SODIUM (PORCINE) 5000 UNIT/ML IJ SOLN
5000.0000 [IU] | Freq: Three times a day (TID) | INTRAMUSCULAR | Status: DC
  Administered 2017-10-08 – 2017-10-18 (×27): 5000 [IU] via SUBCUTANEOUS
  Filled 2017-10-08 (×29): qty 1

## 2017-10-08 NOTE — Progress Notes (Signed)
[facility] [place] NAME: [name]    MR#:  [id number]  DATE OF BIRTH:  November 12, 1970  SUBJECTIVE:  Patient is status post wound VAC change in the OR by Dr. Delana Meyer on 10/08/2017  REVIEW OF SYSTEMS:   Review of Systems  Constitutional: Negative for chills, fever and weight loss.  HENT: Negative for ear discharge, ear pain and nosebleeds.   Eyes: Negative for blurred vision, pain and discharge.  Respiratory: Negative for sputum production, shortness of breath, wheezing and stridor.   Cardiovascular: Negative for chest pain, palpitations, orthopnea and PND.  Gastrointestinal: Negative for abdominal pain, diarrhea, nausea and vomiting.  Genitourinary: Negative for frequency and urgency.  Musculoskeletal: Negative for back pain and joint pain.  Neurological: Positive for weakness. Negative for sensory change, speech change and focal weakness.  Psychiatric/Behavioral: Negative for depression and hallucinations. The patient is not nervous/anxious.    Tolerating Diet:yes Tolerating PT: Rehab  DRUG ALLERGIES:   Allergies  Allergen Reactions  . Lisinopril Swelling    VITALS:  Blood pressure (!) 147/89, pulse 70, temperature 98.2 F (36.8 C), temperature source Oral, resp. rate 19, height 6\' 4"  (1.93 m), weight (!) 182.5 kg (402 lb 6.4 oz), SpO2 100 %.  PHYSICAL EXAMINATION:   Physical Exam  GENERAL:  46 y.o.-year-old patient lying in the bed with no acute distress.  EYES: Pupils equal, round, reactive to light and accommodation. No scleral icterus. Extraocular muscles intact.  HEENT: Head atraumatic, normocephalic. Oropharynx and nasopharynx clear.  NECK:  Supple, no jugular venous distention. No thyroid enlargement, no tenderness.  LUNGS: Normal breath sounds bilaterally, no wheezing, rales, rhonchi. No use of accessory muscles of respiration.  CARDIOVASCULAR: S1, S2 normal. No murmurs, rubs, or gallops.  ABDOMEN: Soft,  nontender, nondistended. Bowel sounds present. No organomegaly or mass.  EXTREMITIES: Left leg edema with wound vac+ NEUROLOGIC: Cranial nerves II through XII are intact. No focal Motor or sensory deficits b/l.   PSYCHIATRIC:  patient is alert and oriented x 3.  SKIN: No obvious rash, lesion, or ulcer.   LABORATORY PANEL:  CBC Recent Labs  Lab 10/06/17 0557  WBC 9.1  HGB 9.6*  HCT 29.3*  PLT 290    Chemistries  Recent Labs  Lab 10/02/17 0439  10/06/17 0557 10/08/17 0355  NA 133*   < > 139  --   K 3.8   < > 4.1  --   CL 102   < > 108  --   CO2 23   < > 23  --   GLUCOSE 386*   < > 131*  --   BUN 56*   < > 46*  --   CREATININE 2.10*   < > 2.71* 2.85*  CALCIUM 8.3*   < > 8.5*  --   MG  --   --  2.0  --   AST 18  --   --   --   ALT 15*  --   --   --   ALKPHOS 112  --   --   --   BILITOT 0.9  --   --   --    < > = values in this interval not displayed.   Cardiac Enzymes No results for input(s): TROPONINI in the last 168 hours. RADIOLOGY:  No results found. ASSESSMENT AND PLAN:  [name] is a 47 y.o. male has a past medical history significant for HTN and DM recently seen by Dr.  Dew for LLE edema now with worsening pain and swelling of LLE with bloody discharge. Also c/o worsening hyperglycemia and nausea. No fever. Denies CP or SOB.   1. Left lower extremity cellulitis/necrotic area with severe chronic venous stasis dermatitis and venous ulceration. -Appreciate Dr. Nino Parsley input -On  IV Zosyn and vanc 9started (10/01/2017) -Blood cultures negative.  White count 17,000--- 13,000 -s/p I and d and debridement of left leg abscess/necrotic area  Wound vac + -Wound VAC change per Dr. Delana Meyer on 10/07/2017--- per vascular patient will need couple more wound VAC changes in the OR given the extent and severity of the wound. Follow-up wound cultures that were sent on 10/07/2017- shows GPC in clusters  2.Uncontrolled type 2 diabetes--sugars wellcontrolled -insulin Lantus 17  unitstwice a day and do meal coverage with insulin aspart and sliding scale -DC metformin given elevated creatinine  3.Hypertension  -continue home meds  4.  Acute on chronic CKD stage III -Avoid nephrotoxins -Creatinine 2.22--- fluids--- 2.10--2.32--2.81--2.71--2.85 -Nephrology input appreciated  5.  DVT prophylaxis subcu heparin  PT recommends rehab. Pt will benefit given wound vac and need for changes   Case discussed with Care Management/Social Worker. Management plans discussed with the patient, family and they are in agreement.  CODE STATUS: Full  DVT Prophylaxis: Heparin  TOTAL TIME TAKING CARE OF THIS PATIENT: 25 minutes.  >50% time spent on counselling and coordination of care  POSSIBLE D/C unclear DAYS, DEPENDING ON CLINICAL CONDITION number of wound VAC changes needed in the OR  Note: This dictation was prepared with Dragon dictation along with smaller phrase technology. Any transcriptional errors that result from this process are unintentional.  Fritzi Mandes M.D on 10/08/2017 at 7:37 AM  Between 7am to 6pm - Pager - [phone]  After 6pm go to [email or web address] - password [email or web address] [facility]  Office  [phone]  CC: Primary care physician; Sallee Lange, NPPatient ID: [name], male   DOB: Mar 31, 1971, 47 y.o.   MRN: [id number]

## 2017-10-08 NOTE — Evaluation (Signed)
Physical Therapy Evaluation Patient Details Name: [name] MRN: [id number] DOB: 04/08/1971 Today's Date: 10/08/2017   History of Present Illness   47 y.o.male had second surgery L ankle with debridement and reapplication of vac, R thigh abscess had I and D yesterday at the same time, cellulitis LLE.  NWB previously on LLE and orders were not on chart for WB at all on either leg.  PMHx:  hyperglycemia, LLE cellulitis, HTN, DM, CKD 3, venous stasis ulcers LLE  Clinical Impression  Pt is demonstrating a good sliding transfer but in standing is struggling to keep LLE NWB.  He is expecting to go to SNF which is realistic since he has stairs to enter house and NWB instruction from previous note.  However, orders are not for NWB and if he is permitted to WB might well be able to downgrade to HHPT.  Will clarify with MD.    Follow Up Recommendations SNF    Equipment Recommendations  Other (comment)(bari RW and 3 in one commode if not off to SNF)    Recommendations for Other Services       Precautions / Restrictions Precautions Precautions: Fall(wound vac L ankle) Restrictions Weight Bearing Restrictions: Yes LLE Weight Bearing: Non weight bearing(on chart in nursing notes but no formal order)      Mobility  Bed Mobility Overal bed mobility: Modified Independent                Transfers Overall transfer level: Needs assistance Equipment used: 1 person hand held assist;Rolling walker (2 wheeled) Transfers: Sit to/from Stand;Lateral/Scoot Transfers Sit to Stand: Min assist;From elevated surface        Lateral/Scoot Transfers: Supervision;Min guard;From elevated surface General transfer comment: pt is able to keep off LLE to slide but with standing is needing cues to avoid LLE  Ambulation/Gait             General Gait Details: avoided due to LLE being unclear for wbing  Stairs            Wheelchair Mobility    Modified Rankin (Stroke Patients Only)        Balance Overall balance assessment: Needs assistance Sitting-balance support: Feet supported Sitting balance-Leahy Scale: Normal     Standing balance support: Bilateral upper extremity supported Standing balance-Leahy Scale: Poor Standing balance comment: Poor to maintain NWB on LLE                             Pertinent Vitals/Pain Pain Assessment: No/denies pain    Home Living Family/patient expects to be discharged to:: Skilled nursing facility Living Arrangements: Alone Available Help at Discharge: Family;Available PRN/intermittently Type of Home: House Home Access: Stairs to enter Entrance Stairs-Rails: Left Entrance Stairs-Number of Steps: 4 Home Layout: Two level;Able to live on main level with bedroom/bathroom Home Equipment: None      Prior Function Level of Independence: Independent         Comments: previously working with no AD needed     Hand Dominance   Dominant Hand: Left    Extremity/Trunk Assessment   Upper Extremity Assessment Upper Extremity Assessment: Overall WFL for tasks assessed    Lower Extremity Assessment Lower Extremity Assessment: LLE deficits/detail LLE Deficits / Details: wound vac and surgery site L ankle so NT for strength and ROM LLE Coordination: decreased fine motor;decreased gross motor    Cervical / Trunk Assessment Cervical / Trunk Assessment: Normal  Communication   Communication:  No difficulties  Cognition Arousal/Alertness: Awake/alert Behavior During Therapy: WFL for tasks assessed/performed Overall Cognitive Status: Within Functional Limits for tasks assessed                                        General Comments General comments (skin integrity, edema, etc.): wound vac is leaking and nursing in to ck and will work on the seal    Exercises     Assessment/Plan    PT Assessment Patient needs continued PT services  PT Problem List Decreased strength;Decreased range of  motion;Decreased activity tolerance;Decreased balance;Decreased mobility;Decreased coordination;Decreased knowledge of use of DME;Decreased safety awareness;Cardiopulmonary status limiting activity;Obesity;Decreased skin integrity       PT Treatment Interventions DME instruction;Gait training;Stair training;Functional mobility training;Balance training;Therapeutic exercise;Therapeutic activities;Patient/family education    PT Goals (Current goals can be found in the Care Plan section)  Acute Rehab PT Goals Patient Stated Goal: get moving independently PT Goal Formulation: With patient Time For Goal Achievement: 10/22/17 Potential to Achieve Goals: Good    Frequency Min 2X/week   Barriers to discharge Inaccessible home environment;Decreased caregiver support Pt lives alone and is in a staired entrance home    Co-evaluation               AM-PAC PT "6 Clicks" Daily Activity  Outcome Measure Difficulty turning over in bed (including adjusting bedclothes, sheets and blankets)?: A Little Difficulty moving from lying on back to sitting on the side of the bed? : A Little Difficulty sitting down on and standing up from a chair with arms (e.g., wheelchair, bedside commode, etc,.)?: Unable Help needed moving to and from a bed to chair (including a wheelchair)?: A Little Help needed walking in hospital room?: A Lot Help needed climbing 3-5 steps with a railing? : Total 6 Click Score: 13    End of Session Equipment Utilized During Treatment: Gait belt Activity Tolerance: Patient tolerated treatment well;Patient limited by fatigue Patient left: in bed;with call bell/phone within reach;with bed alarm set Nurse Communication: Mobility status PT Visit Diagnosis: Difficulty in walking, not elsewhere classified (R26.2);Muscle weakness (generalized) (M62.81);Other abnormalities of gait and mobility (R26.89)    Time: 2725-3664 PT Time Calculation (min) (ACUTE ONLY): 44 min   Charges:   PT  Evaluation $PT Eval Moderate Complexity: 1 Mod PT Treatments $Gait Training: 8-22 mins $Therapeutic Exercise: 8-22 mins   PT G Codes:   PT G-Codes **NOT FOR INPATIENT CLASS** Functional Assessment Tool Used: AM-PAC 6 Clicks Basic Mobility    Ramond Dial 10/08/2017, 12:46 PM   Mee Hives, PT MS Acute Rehab Dept. Number: [facility]

## 2017-10-08 NOTE — Progress Notes (Signed)
Pharmacy Antibiotic Note  [name] is a 47 y.o. male admitted on 10/01/2017 with cellulitis.  Pharmacy has been consulted for zosyn and vancomycin dosing.  Plan: Vancomycin 1500mg  IV every 12 hours.  Goal trough 10-15 mcg/mL. Zosyn 3.375g IV q8h (4 hour infusion).   02/04 @ 0130 VT 36 supratherapeutic drawn appropriately. Scr rising, will hold off vanc for now and will check a random level in 12 hours ~ patient expected to be around 18 mcg/mL in 12 hours. Will reassess regimen once level < 20 mcg/mL  02/04 1642 Vanc random 24. Level still elevated. Will order Vanc random for 2/5 0130 (24 hours after trough level).   02/05 0200 vanc level 18. Will give one time dose of 1500 mg and recheck random vanc with 2/6 AM labs.  02/06 AM vanc level 23. No dose ordered. Recheck random vanc tomorrow AM.  2/7 AM vanc level 16. 1500 mg x1 ordered. Calculated kei 0.13 hr-1, t1/2 50 hours. Recheck vanc random tomorrow AM .  2/8 AM vanc 19. Will give 1x dose of 1500 mg and recheck random level with tomorrow AM labs. SCr also in AM.  2/9 AM vanc level 27. No dose ordered. F/u random level in AM. SCr 2.85.  Height: 6\' 4"  (193 cm) Weight: (!) 402 lb 6.4 oz (182.5 kg) IBW/kg (Calculated) : 86.8  Temp (24hrs), Avg:98.1 F (36.7 C), Min:97.3 F (36.3 C), Max:99.1 F (37.3 C)  Recent Labs  Lab 10/01/17 1035 10/01/17 1333 10/02/17 0439 10/03/17 0130  10/05/17 0305 10/06/17 0557 10/07/17 0343 10/08/17 0355  WBC 17.1* 17.0* 13.3*  --   --   --  9.1  --   --   CREATININE 2.13* 2.22* 2.10* 2.32*  --  2.81* 2.71*  --  2.85*  LATICACIDVEN 1.8 2.1*  --   --   --   --   --   --   --   VANCOTROUGH  --   --   --  36*  --   --   --   --   --   VANCORANDOM  --   --   --   --    < > 23 16 19 27    < > = values in this interval not displayed.    Estimated Creatinine Clearance: 56.7 mL/min (A) (by C-G formula based on SCr of 2.85 mg/dL (H)).    Allergies  Allergen Reactions  . Lisinopril Swelling     Antimicrobials this admission: Anti-infectives (From admission, onward)   Start     Dose/Rate Route Frequency Ordered Stop   10/07/17 0600  vancomycin (VANCOCIN) 1,500 mg in sodium chloride 0.9 % 500 mL IVPB     1,500 mg 250 mL/hr over 120 Minutes Intravenous  Once 10/07/17 0556 10/07/17 0824   10/06/17 0800  vancomycin (VANCOCIN) 1,500 mg in sodium chloride 0.9 % 500 mL IVPB     1,500 mg 250 mL/hr over 120 Minutes Intravenous  Once 10/06/17 0715 10/06/17 1054   10/04/17 0600  vancomycin (VANCOCIN) 1,500 mg in sodium chloride 0.9 % 500 mL IVPB     1,500 mg 250 mL/hr over 120 Minutes Intravenous  Once 10/04/17 0426 10/04/17 1303   10/03/17 1330  ceFAZolin (ANCEF) IVPB 1 g/50 mL premix     1 g 100 mL/hr over 30 Minutes Intravenous  Once 10/03/17 1323 10/03/17 1450   10/03/17 1320  ceFAZolin (ANCEF) 1-4 GM/50ML-% IVPB    Comments:  Machia, Millissa   : cabinet override  10/03/17 1320 10/03/17 1420   10/01/17 1800  piperacillin-tazobactam (ZOSYN) IVPB 3.375 g     3.375 g 12.5 mL/hr over 240 Minutes Intravenous Every 8 hours 10/01/17 1133     10/01/17 1400  vancomycin (VANCOCIN) 1,500 mg in sodium chloride 0.9 % 500 mL IVPB  Status:  Discontinued     1,500 mg 250 mL/hr over 120 Minutes Intravenous Every 12 hours 10/01/17 1156 10/03/17 0203   10/01/17 1130  piperacillin-tazobactam (ZOSYN) IVPB 3.375 g     3.375 g 100 mL/hr over 30 Minutes Intravenous  Once 10/01/17 1120 10/01/17 1211   10/01/17 1130  vancomycin (VANCOCIN) IVPB 1000 mg/200 mL premix     1,000 mg 200 mL/hr over 60 Minutes Intravenous  Once 10/01/17 1120 10/01/17 1300      Microbiology results: Recent Results (from the past 240 hour(s))  Blood Culture (routine x 2)     Status: None   Collection Time: 10/01/17 10:41 AM  Result Value Ref Range Status   Specimen Description BLOOD LEFT ANTECUBITAL  Final   Special Requests   Final    BOTTLES DRAWN AEROBIC AND ANAEROBIC Blood Culture results may not be optimal due  to an excessive volume of blood received in culture bottles   Culture   Final    NO GROWTH 5 DAYS Performed at [facility], [address] [place], [place] [address]    Report Status 10/06/2017 FINAL  Final  Blood Culture (routine x 2)     Status: None   Collection Time: 10/01/17 11:31 AM  Result Value Ref Range Status   Specimen Description BLOOD BLOOD RIGHT ARM  Final   Special Requests   Final    BOTTLES DRAWN AEROBIC AND ANAEROBIC Blood Culture adequate volume   Culture   Final    NO GROWTH 5 DAYS Performed at [facility], [address] [place], [place] [address]    Report Status 10/06/2017 FINAL  Final  MRSA PCR Screening     Status: None   Collection Time: 10/03/17  8:02 AM  Result Value Ref Range Status   MRSA by PCR NEGATIVE NEGATIVE Final    Comment:        The GeneXpert MRSA Assay (FDA approved for NASAL specimens only), is one component of a comprehensive MRSA colonization surveillance program. It is not intended to diagnose MRSA infection nor to guide or monitor treatment for MRSA infections. Performed at [facility], [address], [place], [place] [address]   Aerobic/Anaerobic Culture (surgical/deep wound)     Status: None (Preliminary result)   Collection Time: 10/07/17  8:25 AM  Result Value Ref Range Status   Specimen Description   Final    WOUND Performed at [facility], [address], [place], [place] [address]    Special Requests   Final    PUS FROM R LEG ABSCESS Performed at [facility], [address] [place], [place] [address]    Gram Stain   Final    MODERATE WBC PRESENT,BOTH PMN AND MONONUCLEAR FEW SQUAMOUS EPITHELIAL CELLS PRESENT FEW GRAM POSITIVE COCCI IN CLUSTERS Performed at [facility], [place] [address] [place], [place] [address]    Culture PENDING  Incomplete   Report Status PENDING  Incomplete   Thank you for allowing pharmacy to be a part of this patient's  care.  Eloise Harman, PharmD, BCPS Clinical Pharmacist 10/08/2017 6:11 AM

## 2017-10-08 NOTE — Progress Notes (Signed)
Pharmacy Lovenox Dosing  47 y.o. male admitted with Hyperglycemia and Wound Check . Patient ordered Lovenox40  mg bidfor VTE prophylaxis.   Filed Weights   10/04/17 0428 10/05/17 0623 10/06/17 0313  Weight: (!) 406 lb 3.2 oz (184.3 kg) (!) 402 lb 1.6 oz (182.4 kg) (!) 402 lb 6.4 oz (182.5 kg)    Body mass index is 48.98 kg/m.  Estimated Creatinine Clearance: 56.7 mL/min (A) (by C-G formula based on SCr of 2.85 mg/dL (H)).  Will convert patient to subcutaneous heparin 5000 units tid for for patient in ARF.  Napoleon Form 10/08/2017 7:31 AM

## 2017-10-09 LAB — GLUCOSE, CAPILLARY
GLUCOSE-CAPILLARY: 155 mg/dL — AB (ref 65–99)
GLUCOSE-CAPILLARY: 126 mg/dL — AB (ref 65–99)
GLUCOSE-CAPILLARY: 91 mg/dL (ref 65–99)
Glucose-Capillary: 101 mg/dL — ABNORMAL HIGH (ref 65–99)

## 2017-10-09 LAB — CREATININE, SERUM
Creatinine, Ser: 2.7 mg/dL — ABNORMAL HIGH (ref 0.61–1.24)
GFR calc non Af Amer: 26 mL/min — ABNORMAL LOW (ref >60)
GFR, EST AFRICAN AMERICAN: 31 mL/min — AB (ref >60)

## 2017-10-09 LAB — VANCOMYCIN, RANDOM: Vancomycin Rm: 17

## 2017-10-09 MED ORDER — SODIUM CHLORIDE 0.9 % IV SOLN
1500.0000 mg | INTRAVENOUS | Status: DC
  Administered 2017-10-09: 1500 mg via INTRAVENOUS
  Filled 2017-10-09 (×2): qty 1500

## 2017-10-09 NOTE — Progress Notes (Signed)
[facility] [place] NAME: [name]    MR#:  [id number]  DATE OF BIRTH:  Nov 04, 1970  SUBJECTIVE:  Patient is status post wound VAC change in the OR by Dr. Delana Meyer , wound VAC dressing changed by the RN today.  Patient is resting comfortably feeling sad but denies any depression at this time.  REVIEW OF SYSTEMS:   Review of Systems  Constitutional: Negative for chills, fever and weight loss.  HENT: Negative for ear discharge, ear pain and nosebleeds.   Eyes: Negative for blurred vision, pain and discharge.  Respiratory: Negative for sputum production, shortness of breath, wheezing and stridor.   Cardiovascular: Negative for chest pain, palpitations, orthopnea and PND.  Gastrointestinal: Negative for abdominal pain, diarrhea, nausea and vomiting.  Genitourinary: Negative for frequency and urgency.  Musculoskeletal: Negative for back pain and joint pain.  Neurological: Positive for weakness. Negative for sensory change, speech change and focal weakness.  Psychiatric/Behavioral: Negative for depression and hallucinations. The patient is not nervous/anxious.    Tolerating Diet:yes Tolerating PT: Rehab  DRUG ALLERGIES:   Allergies  Allergen Reactions  . Lisinopril Swelling    VITALS:  Blood pressure (!) 154/76, pulse 71, temperature 98.8 F (37.1 C), temperature source Oral, resp. rate 18, height 6\' 4"  (1.93 m), weight (!) (P) 182.3 kg (402 lb), SpO2 96 %.  PHYSICAL EXAMINATION:   Physical Exam  GENERAL:  47 y.o.-year-old patient lying in the bed with no acute distress.  EYES: Pupils equal, round, reactive to light and accommodation. No scleral icterus. Extraocular muscles intact.  HEENT: Head atraumatic, normocephalic. Oropharynx and nasopharynx clear.  NECK:  Supple, no jugular venous distention. No thyroid enlargement, no tenderness.  LUNGS: Normal breath sounds bilaterally, no wheezing, rales, rhonchi. No use of accessory  muscles of respiration.  CARDIOVASCULAR: S1, S2 normal. No murmurs, rubs, or gallops.  ABDOMEN: Soft, nontender, nondistended. Bowel sounds present. No organomegaly or mass.  EXTREMITIES: Left leg edema with wound vac+ NEUROLOGIC: Cranial nerves II through XII are intact. No focal Motor or sensory deficits b/l.   PSYCHIATRIC:  patient is alert and oriented x 3.  SKIN: No obvious rash, lesion, or ulcer.   LABORATORY PANEL:  CBC Recent Labs  Lab 10/06/17 0557  WBC 9.1  HGB 9.6*  HCT 29.3*  PLT 290    Chemistries  Recent Labs  Lab 10/06/17 0557  10/09/17 0334  NA 139  --   --   K 4.1  --   --   CL 108  --   --   CO2 23  --   --   GLUCOSE 131*  --   --   BUN 46*  --   --   CREATININE 2.71*   < > 2.70*  CALCIUM 8.5*  --   --   MG 2.0  --   --    < > = values in this interval not displayed.   Cardiac Enzymes No results for input(s): TROPONINI in the last 168 hours. RADIOLOGY:  No results found. ASSESSMENT AND PLAN:  [name] is a 47 y.o. male has a past medical history significant for HTN and DM recently seen by [name] for LLE edema now with worsening pain and swelling of LLE with bloody discharge. Also c/o worsening hyperglycemia and nausea. No fever. Denies CP or SOB.   1. Left lower extremity cellulitis/necrotic area with severe chronic venous stasis dermatitis and venous ulceration. -Appreciate Dr. Nino Parsley input -  On empirically IV Zosyn and vanc -Blood cultures negative.  White count normal -s/p I and d and debridement of left leg abscess/necrotic area  Wound vac + -Wound VAC change per Dr. Delana Meyer on 10/07/2017--- per vascular patient will need couple more wound VAC changes in the OR given the extent and severity of the wound. Follow-up wound cultures that were sent on 10/07/2017-Staphylococcus aureus pansensitive  2.Uncontrolled type 2 diabetes -insulin Lantus 17 unitstwice a day and do meal coverage with insulin aspart and sliding scale -DC metformin  given elevated creatinine  3.Hypertension  -continue home meds  4.  Acute on chronic CKD stage III -Avoid nephrotoxins -Creatinine 2.22--- fluids--- 2.10--2.32--2.8-2.70 -Nephrology input appreciated  5.  DVT prophylaxis subcu heparin  PT recommends rehab. Pt will benefit given wound vac and need for changes   Case discussed with Care Management/Social Worker. Management plans discussed with the patient, family and they are in agreement.  CODE STATUS: Full  DVT Prophylaxis: Heparin  TOTAL TIME TAKING CARE OF THIS PATIENT: 26 minutes.  >50% time spent on counselling and coordination of care  POSSIBLE D/C unclear DAYS, DEPENDING ON CLINICAL CONDITION number of wound VAC changes in the OR needed.  Note: This dictation was prepared with Dragon dictation along with smaller phrase technology. Any transcriptional errors that result from this process are unintentional.  Nicholes Mango M.D on 10/09/2017 at 2:28 PM  Between 7am to 6pm - Pager - [phone]  After 6pm go to [email or web address] - password [email or web address] [facility]  Office  [phone]  CC: Primary care physician; Sallee Lange, NPPatient ID: [name], male   DOB: 07/22/1971, 47 y.o.   MRN: [id number]

## 2017-10-09 NOTE — Progress Notes (Signed)
Pharmacy Antibiotic Note  [name] is a 47 y.o. male admitted on 10/01/2017 with cellulitis.  Pharmacy has been consulted for zosyn and vancomycin dosing.  Plan: Vancomycin 1500mg  IV every 12 hours.  Goal trough 10-15 mcg/mL. Zosyn 3.375g IV q8h (4 hour infusion).   02/04 @ 0130 VT 36 supratherapeutic drawn appropriately. Scr rising, will hold off vanc for now and will check a random level in 12 hours ~ patient expected to be around 18 mcg/mL in 12 hours. Will reassess regimen once level < 20 mcg/mL  02/04 1642 Vanc random 24. Level still elevated. Will order Vanc random for 2/5 0130 (24 hours after trough level).   02/05 0200 vanc level 18. Will give one time dose of 1500 mg and recheck random vanc with 2/6 AM labs.  02/06 AM vanc level 23. No dose ordered. Recheck random vanc tomorrow AM.  2/7 AM vanc level 16. 1500 mg x1 ordered. Calculated kei 0.13 hr-1, t1/2 50 hours. Recheck vanc random tomorrow AM .  2/8 AM vanc 19. Will give 1x dose of 1500 mg and recheck random level with tomorrow AM labs. SCr also in AM.  2/9 AM vanc level 27. No dose ordered. F/u random level in AM. SCr 2.85.  2/10 AM vanc level 17. Will start vanc 1500 mg q 48 hours. Level before 2/12 dose is due.  Height: 6\' 4"  (193 cm) Weight: (!) 402 lb 6.4 oz (182.5 kg) IBW/kg (Calculated) : 86.8  Temp (24hrs), Avg:98.6 F (37 C), Min:98.3 F (36.8 C), Max:98.8 F (37.1 C)  Recent Labs  Lab 10/03/17 0130  10/05/17 0305 10/06/17 0557  10/08/17 0355 10/09/17 0334  WBC  --   --   --  9.1  --   --   --   CREATININE 2.32*  --  2.81* 2.71*  --  2.85* 2.70*  VANCOTROUGH 36*  --   --   --   --   --   --   VANCORANDOM  --    < > 23 16   < > 27 17   < > = values in this interval not displayed.    Estimated Creatinine Clearance: 59.8 mL/min (A) (by C-G formula based on SCr of 2.7 mg/dL (H)).    Allergies  Allergen Reactions  . Lisinopril Swelling    Antimicrobials this admission: Anti-infectives  (From admission, onward)   Start     Dose/Rate Route Frequency Ordered Stop   10/09/17 0800  vancomycin (VANCOCIN) 1,500 mg in sodium chloride 0.9 % 500 mL IVPB     1,500 mg 250 mL/hr over 120 Minutes Intravenous Every 48 hours 10/09/17 0540     10/07/17 0600  vancomycin (VANCOCIN) 1,500 mg in sodium chloride 0.9 % 500 mL IVPB     1,500 mg 250 mL/hr over 120 Minutes Intravenous  Once 10/07/17 0556 10/07/17 0824   10/06/17 0800  vancomycin (VANCOCIN) 1,500 mg in sodium chloride 0.9 % 500 mL IVPB     1,500 mg 250 mL/hr over 120 Minutes Intravenous  Once 10/06/17 0715 10/06/17 1054   10/04/17 0600  vancomycin (VANCOCIN) 1,500 mg in sodium chloride 0.9 % 500 mL IVPB     1,500 mg 250 mL/hr over 120 Minutes Intravenous  Once 10/04/17 0426 10/04/17 1303   10/03/17 1330  ceFAZolin (ANCEF) IVPB 1 g/50 mL premix     1 g 100 mL/hr over 30 Minutes Intravenous  Once 10/03/17 1323 10/03/17 1450   10/03/17 1320  ceFAZolin (ANCEF) 1-4 GM/50ML-%  IVPB    Comments:  Rivka Spring   : cabinet override      10/03/17 1320 10/03/17 1420   10/01/17 1800  piperacillin-tazobactam (ZOSYN) IVPB 3.375 g     3.375 g 12.5 mL/hr over 240 Minutes Intravenous Every 8 hours 10/01/17 1133     10/01/17 1400  vancomycin (VANCOCIN) 1,500 mg in sodium chloride 0.9 % 500 mL IVPB  Status:  Discontinued     1,500 mg 250 mL/hr over 120 Minutes Intravenous Every 12 hours 10/01/17 1156 10/03/17 0203   10/01/17 1130  piperacillin-tazobactam (ZOSYN) IVPB 3.375 g     3.375 g 100 mL/hr over 30 Minutes Intravenous  Once 10/01/17 1120 10/01/17 1211   10/01/17 1130  vancomycin (VANCOCIN) IVPB 1000 mg/200 mL premix     1,000 mg 200 mL/hr over 60 Minutes Intravenous  Once 10/01/17 1120 10/01/17 1300      Microbiology results: Recent Results (from the past 240 hour(s))  Blood Culture (routine x 2)     Status: None   Collection Time: 10/01/17 10:41 AM  Result Value Ref Range Status   Specimen Description BLOOD LEFT ANTECUBITAL   Final   Special Requests   Final    BOTTLES DRAWN AEROBIC AND ANAEROBIC Blood Culture results may not be optimal due to an excessive volume of blood received in culture bottles   Culture   Final    NO GROWTH 5 DAYS Performed at [facility], [address] [place], [place] [address]    Report Status 10/06/2017 FINAL  Final  Blood Culture (routine x 2)     Status: None   Collection Time: 10/01/17 11:31 AM  Result Value Ref Range Status   Specimen Description BLOOD BLOOD RIGHT ARM  Final   Special Requests   Final    BOTTLES DRAWN AEROBIC AND ANAEROBIC Blood Culture adequate volume   Culture   Final    NO GROWTH 5 DAYS Performed at [facility], [address] [place], [place] [address]    Report Status 10/06/2017 FINAL  Final  MRSA PCR Screening     Status: None   Collection Time: 10/03/17  8:02 AM  Result Value Ref Range Status   MRSA by PCR NEGATIVE NEGATIVE Final    Comment:        The GeneXpert MRSA Assay (FDA approved for NASAL specimens only), is one component of a comprehensive MRSA colonization surveillance program. It is not intended to diagnose MRSA infection nor to guide or monitor treatment for MRSA infections. Performed at [facility], [address], [place], [place] [address]   Aerobic/Anaerobic Culture (surgical/deep wound)     Status: None (Preliminary result)   Collection Time: 10/07/17  8:25 AM  Result Value Ref Range Status   Specimen Description   Final    WOUND Performed at [facility], [address] [place], [place] [address]    Special Requests   Final    PUS FROM R LEG ABSCESS Performed at [facility], [address] [place], [place] [address]    Gram Stain   Final    MODERATE WBC PRESENT,BOTH PMN AND MONONUCLEAR FEW SQUAMOUS EPITHELIAL CELLS PRESENT FEW GRAM POSITIVE COCCI IN CLUSTERS Performed at [facility], [place] [address] [place],  [address]    Culture FEW  STAPHYLOCOCCUS AUREUS  Final   Report Status PENDING  Incomplete   Thank you for allowing pharmacy to be a part of this patient's care.  Kayah Hecker S,  PharmD, BCPS Clinical Pharmacist 10/09/2017 5:40 AM

## 2017-10-09 NOTE — Progress Notes (Signed)
Nursing called to report wound vac reads blockage.  Nursing consulted with surgery floor for troubleshooting effort, and unable to get wound vac working.  Vascular PA contacted and recommends removing wound vac and placing wet to dry dressing as pt will be unable to have wound vac changed out tonight.  Jacqulyn [facility] 10/09/2017, 12:12 AM

## 2017-10-09 NOTE — Progress Notes (Addendum)
Re-assessed wound vac dressing with charge nurse Elsie Amis, RN. Changed top layer of dressing (Tegaderm-Suction). Reapplied and attached to suction. No leaks and machine no longer alarming "blockage". Noted when changing Tegaderm suction was not over wound but over leg. Repositioned and drainage noted to fill tubing. No foam was removed. No other issues or concerns at this time. Pnt resting. Bed low and locked, call bell in reach. Will continue to monitor and assess.

## 2017-10-10 ENCOUNTER — Encounter: Payer: Self-pay | Admitting: Vascular Surgery

## 2017-10-10 LAB — BASIC METABOLIC PANEL
ANION GAP: 7 (ref 5–15)
BUN: 34 mg/dL — ABNORMAL HIGH (ref 6–20)
CALCIUM: 8.5 mg/dL — AB (ref 8.9–10.3)
CO2: 21 mmol/L — ABNORMAL LOW (ref 22–32)
Chloride: 109 mmol/L (ref 101–111)
Creatinine, Ser: 2.5 mg/dL — ABNORMAL HIGH (ref 0.61–1.24)
GFR, EST AFRICAN AMERICAN: 34 mL/min — AB (ref >60)
GFR, EST NON AFRICAN AMERICAN: 29 mL/min — AB (ref >60)
Glucose, Bld: 108 mg/dL — ABNORMAL HIGH (ref 65–99)
Potassium: 4.5 mmol/L (ref 3.5–5.1)
Sodium: 137 mmol/L (ref 135–145)

## 2017-10-10 LAB — CBC
HCT: 26.8 % — ABNORMAL LOW (ref 40.0–52.0)
Hemoglobin: 8.9 g/dL — ABNORMAL LOW (ref 13.0–18.0)
MCH: 29.5 pg (ref 26.0–34.0)
MCHC: 33.2 g/dL (ref 32.0–36.0)
MCV: 88.8 fL (ref 80.0–100.0)
PLATELETS: 409 10*3/uL (ref 150–440)
RBC: 3.02 MIL/uL — AB (ref 4.40–5.90)
RDW: 14.3 % (ref 11.5–14.5)
WBC: 7.3 10*3/uL (ref 3.8–10.6)

## 2017-10-10 LAB — GLUCOSE, CAPILLARY
GLUCOSE-CAPILLARY: 80 mg/dL (ref 65–99)
GLUCOSE-CAPILLARY: 132 mg/dL — AB (ref 65–99)
GLUCOSE-CAPILLARY: 95 mg/dL (ref 65–99)

## 2017-10-10 MED ORDER — VITAMIN C 500 MG PO TABS
500.0000 mg | ORAL_TABLET | Freq: Two times a day (BID) | ORAL | Status: DC
  Administered 2017-10-11 – 2017-10-19 (×13): 500 mg via ORAL
  Filled 2017-10-10 (×18): qty 1

## 2017-10-10 MED ORDER — ADULT MULTIVITAMIN W/MINERALS CH
1.0000 | ORAL_TABLET | Freq: Every day | ORAL | Status: DC
  Administered 2017-10-12 – 2017-10-19 (×6): 1 via ORAL
  Filled 2017-10-10 (×6): qty 1

## 2017-10-10 MED ORDER — PREMIER PROTEIN SHAKE: 11.0000 [oz_av] | Freq: Two times a day (BID) | ORAL | Status: DC

## 2017-10-10 NOTE — Progress Notes (Signed)
Initial Nutrition Assessment  DOCUMENTATION CODES:   Morbid obesity  INTERVENTION:  Discontinued Nepro.  Provide Premier Protein po BID, each supplement provides 160 kcal and 30 grams of protein.  Provide daily MVI.  Provide vitamin C 500 mg BID for 10 days.  Discussed importance of good glycemic control for wound healing.  NUTRITION DIAGNOSIS:   Increased nutrient needs(protein, micronutrients) related to wound healing(increased losses from negative pressure wound therapy) as evidenced by estimated needs.  GOAL:   Patient will meet greater than or equal to 90% of their needs  MONITOR:   PO intake, Supplement acceptance, Labs, Weight trends, Skin, I & O's  REASON FOR ASSESSMENT:   LOS    ASSESSMENT:   47 year old male with PMHx of DM type 2, CKD III, vitamin D deficiency, HTN admitted with left lower extremity cellulitis/necrotic area with severe chronic venous stasis ulcer s/p I&D of necrotic area with wound VAC placement.   Met with patient at bedside. He reports he has a good appetite. He has been working hard to control his blood glucose here because he knows that has contributed to his wound and CKD. He reports he worked 2 jobs for 18 years and he had a lot of leg edema. He also did not always control his diabetes well (noted HgbA1c 15). He reports he knows what to do with nutrition and has been working hard on that this admission.   UBW 350-360 lbs. Patient weight has increased by about 50 lbs since admission. Unsure if it is entirely from fluid, so weights may not be accurate. Will use 159.5 kg to estimate needs.  Medications reviewed and include: Colace, Novolog 0-15 units TID, Novolog 2 units TID, Lantus 17 units BID, Zosyn, vancomycin.  Labs reviewed: CBG 80-155, CO2 21, BUN 34, Creatinine 2.5.  Patient does not meet criteria for malnutrition.  Discussed with RN.  NUTRITION - FOCUSED PHYSICAL EXAM:    Most Recent Value  Orbital Region  No depletion   Upper Arm Region  No depletion  Thoracic and Lumbar Region  No depletion  Buccal Region  No depletion  Temple Region  No depletion  Clavicle Bone Region  No depletion  Clavicle and Acromion Bone Region  No depletion  Scapular Bone Region  No depletion  Dorsal Hand  No depletion  Patellar Region  No depletion  Anterior Thigh Region  No depletion  Posterior Calf Region  No depletion  Edema (RD Assessment)  Moderate  Hair  Reviewed  Eyes  Reviewed  Mouth  Reviewed  Skin  Reviewed  Nails  Reviewed     Diet Order:  Diet Carb Modified Fluid consistency: Thin; Room service appropriate? Yes  EDUCATION NEEDS:   Education needs have been addressed  Skin:  Skin Assessment: Skin Integrity Issues: Skin Integrity Issues:: Other (Comment), Incisions Incisions: closed incision left leg; wound VAC Other: open wounds left leg and right thigh  Last BM:  10/10/2017 - large type 4  Height:   Ht Readings from Last 1 Encounters:  10/01/17 '6\' 4"'$  (1.93 m)    Weight:   Wt Readings from Last 1 Encounters:  10/10/17 (!) 402 lb 14.4 oz (182.8 kg)    Ideal Body Weight:  91.8 kg  BMI:  Body mass index is 49.04 kg/m.  Estimated Nutritional Needs:   Kcal:  0712-1975  Protein:  120-140 grams (1.2-1.5 grams/kg IBW)  Fluid:  2.2 L/day (25 mL/kg IBW)  Willey Blade, MS, RD, LDN Office: [phone] Pager: [phone] After Hours/Weekend  Pager: [phone]

## 2017-10-10 NOTE — Progress Notes (Signed)
[facility] [place] NAME: [name]    MR#:  [id number]  DATE OF BIRTH:  23-Apr-1971  SUBJECTIVE:  Patient is status post wound VAC change in the OR by Dr. Delana Meyer , wound [facility] dressing changed by the RN on 10/09/2017.  No new complaints today REVIEW OF SYSTEMS:   Review of Systems  Constitutional: Negative for chills, fever and weight loss.  HENT: Negative for ear discharge, ear pain and nosebleeds.   Eyes: Negative for blurred vision, pain and discharge.  Respiratory: Negative for sputum production, shortness of breath, wheezing and stridor.   Cardiovascular: Negative for chest pain, palpitations, orthopnea and PND.  Gastrointestinal: Negative for abdominal pain, diarrhea, nausea and vomiting.  Genitourinary: Negative for frequency and urgency.  Musculoskeletal: Negative for back pain and joint pain.  Neurological: Positive for weakness. Negative for sensory change, speech change and focal weakness.  Psychiatric/Behavioral: Negative for depression and hallucinations. The patient is not nervous/anxious.    Tolerating Diet:yes Tolerating PT: Rehab  DRUG ALLERGIES:   Allergies  Allergen Reactions  . Lisinopril Swelling    VITALS:  Blood pressure 129/62, pulse 74, temperature 99.1 F (37.3 C), temperature source Oral, resp. rate 19, height 6\' 4"  (1.93 m), weight (!) 182.8 kg (402 lb 14.4 oz), SpO2 96 %.  PHYSICAL EXAMINATION:   Physical Exam  GENERAL:  46 y.o.-year-old patient lying in the bed with no acute distress.  EYES: Pupils equal, round, reactive to light and accommodation. No scleral icterus. Extraocular muscles intact.  HEENT: Head atraumatic, normocephalic. Oropharynx and nasopharynx clear.  NECK:  Supple, no jugular venous distention. No thyroid enlargement, no tenderness.  LUNGS: Normal breath sounds bilaterally, no wheezing, rales, rhonchi. No use of accessory muscles of respiration.  CARDIOVASCULAR: S1, S2  normal. No murmurs, rubs, or gallops.  ABDOMEN: Soft, nontender, nondistended. Bowel sounds present. No organomegaly or mass.  EXTREMITIES: Left leg edema with wound vac+ NEUROLOGIC: Cranial nerves II through XII are intact. No focal Motor or sensory deficits b/l.   PSYCHIATRIC:  patient is alert and oriented x 3.  SKIN: No obvious rash, lesion, or ulcer.   LABORATORY PANEL:  CBC Recent Labs  Lab 10/10/17 0313  WBC 7.3  HGB 8.9*  HCT 26.8*  PLT 409    Chemistries  Recent Labs  Lab 10/06/17 0557  10/10/17 0313  NA 139  --  137  K 4.1  --  4.5  CL 108  --  109  CO2 23  --  21*  GLUCOSE 131*  --  108*  BUN 46*  --  34*  CREATININE 2.71*   < > 2.50*  CALCIUM 8.5*  --  8.5*  MG 2.0  --   --    < > = values in this interval not displayed.   Cardiac Enzymes No results for input(s): TROPONINI in the last 168 hours. RADIOLOGY:  No results found. ASSESSMENT AND PLAN:  [name] is a 47 y.o. male has a past medical history significant for HTN and DM recently seen by [name] for LLE edema now with worsening pain and swelling of LLE with bloody discharge. Also c/o worsening hyperglycemia and nausea. No fever. Denies CP or SOB.   1. Left lower extremity cellulitis/necrotic area with severe chronic venous stasis dermatitis and venous ulceration. -Appreciate Dr. Nino Parsley input -On empirically IV Zosyn and vanc -Blood cultures negative.  White count normal -s/p I and d and debridement of left leg abscess/necrotic  area  Wound vac + -Wound VAC change per Dr. Delana Meyer on 10/07/2017--- per vascular patient will need couple more wound VAC changes in the OR given the extent and severity of the wound.  Might consider changing wound VAC dressing tomorrow or on Wednesday Follow-up wound cultures that were sent on 10/07/2017-Staphylococcus aureus pansensitive  2.Uncontrolled type 2 diabetes hemoglobin A1c 15.1- -insulin Lantus 17 unitstwice a day and do meal coverage with insulin aspart  and sliding scale -DC metformin given elevated creatinine  3.Hypertension  -continue home meds  4.  Acute on chronic CKD stage III with proteinuria secondary to diabetic nephropathy -Avoid nephrotoxins Patient's baseline creatinine is 1.6 -Creatinine 2.22--- fluids--- 2.10--2.32--2.8-2.70- 2.5, this may be patient's new baseline but continue to monitor closely -Nephrology input appreciated  5.  DVT prophylaxis subcu heparin  PT recommends rehab. Pt will benefit given wound vac and need for changes   Case discussed with Care Management/Social Worker. Management plans discussed with the patient, family and they are in agreement.  CODE STATUS: Full  DVT Prophylaxis: Heparin  TOTAL TIME TAKING CARE OF THIS PATIENT: 26 minutes.  >50% time spent on counselling and coordination of care  POSSIBLE D/C unclear DAYS, DEPENDING ON CLINICAL CONDITION number of wound VAC changes in the OR needed.  Note: This dictation was prepared with Dragon dictation along with smaller phrase technology. Any transcriptional errors that result from this process are unintentional.  Nicholes Mango M.D on 10/10/2017 at 8:54 PM  Between 7am to 6pm - Pager - [phone]  After 6pm go to [email or web address] - password [email or web address] [facility]  Office  [phone]  CC: Primary care physician; Sallee Lange, NPPatient ID: [name], male   DOB: 1971/06/29, 47 y.o.   MRN: [id number]

## 2017-10-10 NOTE — Progress Notes (Signed)
Chaplain followed up with Pt. Ch discussed plan and family with the Pt. He seems to feel very posivte

## 2017-10-10 NOTE — Progress Notes (Signed)
[facility], [place] 10/10/17  Subjective:  Renal function is slowly improving. Creatinine down to 2.5 with an EGFR of 34. Still has a wound VAC in place on his left lower extremity.   Objective:  Vital signs in last 24 hours:  Temp:  [98.4 F (36.9 C)-98.7 F (37.1 C)] 98.7 F (37.1 C) (02/11 0740) Pulse Rate:  [66-67] 67 (02/11 0740) Resp:  [16-18] 18 (02/11 0740) BP: (133-156)/(69-76) 156/70 (02/11 0740) SpO2:  [96 %-99 %] 99 % (02/11 0740) Weight:  [182.8 kg (402 lb 14.4 oz)] 182.8 kg (402 lb 14.4 oz) (02/11 0401)  Weight change: 0.408 kg (14.4 oz) Filed Weights   10/06/17 0313 10/09/17 0404 10/10/17 0401  Weight: (!) 182.5 kg (402 lb 6.4 oz) (!) 182.3 kg (402 lb) (!) 182.8 kg (402 lb 14.4 oz)    Intake/Output:    Intake/Output Summary (Last 24 hours) at 10/10/2017 1230 Last data filed at 10/10/2017 1042 Gross per 24 hour  Intake 460 ml  Output 1700 ml  Net -1240 ml     Physical Exam: General: No acute distress  HEENT [place]/AT Mucous membranes moist.   Neck supple  Pulm/lungs Lungs clear to auscultation bilaterally. No wheezing.   CVS/Heart Regular rate and rhythm. No murmurs, rubs, gallops.   Abdomen:  Soft and nontender, BS present  Extremities: 2+ b/l LE edema  Neurologic: AAOX3. Speech and affect normal.  Skin: LLE edema with erythema; wound vac on left heel          Basic Metabolic Panel:  Recent Labs  Lab 10/05/17 0305 10/06/17 0557 10/08/17 0355 10/09/17 0334 10/10/17 0313  NA 136 139  --   --  137  K 3.9 4.1  --   --  4.5  CL 107 108  --   --  109  CO2 23 23  --   --  21*  GLUCOSE 189* 131*  --   --  108*  BUN 52* 46*  --   --  34*  CREATININE 2.81* 2.71* 2.85* 2.70* 2.50*  CALCIUM 8.1* 8.5*  --   --  8.5*  MG  --  2.0  --   --   --   PHOS 3.9  --   --   --   --      CBC: Recent Labs  Lab 10/06/17 0557 10/10/17 0313  WBC 9.1 7.3  HGB 9.6* 8.9*  HCT 29.3* 26.8*  MCV 88.9 88.8  PLT 290 409     No  results found for: HEPBSAG, HEPBSAB, HEPBIGM    Microbiology:  Recent Results (from the past 240 hour(s))  Blood Culture (routine x 2)     Status: None   Collection Time: 10/01/17 10:41 AM  Result Value Ref Range Status   Specimen Description BLOOD LEFT ANTECUBITAL  Final   Special Requests   Final    BOTTLES DRAWN AEROBIC AND ANAEROBIC Blood Culture results may not be optimal due to an excessive volume of blood received in culture bottles   Culture   Final    NO GROWTH 5 DAYS Performed at [facility], [address] [place], [place] [address]    Report Status 10/06/2017 FINAL  Final  Blood Culture (routine x 2)     Status: None   Collection Time: 10/01/17 11:31 AM  Result Value Ref Range Status   Specimen Description BLOOD BLOOD RIGHT ARM  Final   Special Requests   Final    BOTTLES DRAWN AEROBIC  AND ANAEROBIC Blood Culture adequate volume   Culture   Final    NO GROWTH 5 DAYS Performed at [facility], [address] [place], [place] [address]    Report Status 10/06/2017 FINAL  Final  MRSA PCR Screening     Status: None   Collection Time: 10/03/17  8:02 AM  Result Value Ref Range Status   MRSA by PCR NEGATIVE NEGATIVE Final    Comment:        The GeneXpert MRSA Assay (FDA approved for NASAL specimens only), is one component of a comprehensive MRSA colonization surveillance program. It is not intended to diagnose MRSA infection nor to guide or monitor treatment for MRSA infections. Performed at [facility], [address], [place], [place] [address]   Aerobic/Anaerobic Culture (surgical/deep wound)     Status: None (Preliminary result)   Collection Time: 10/07/17  8:25 AM  Result Value Ref Range Status   Specimen Description   Final    WOUND Performed at [facility], [address], [place], [place] [address]    Special Requests   Final    PUS FROM R LEG ABSCESS Performed at [facility], [address] [place], [place] [address]    Gram Stain   Final    MODERATE WBC PRESENT,BOTH PMN AND MONONUCLEAR FEW SQUAMOUS EPITHELIAL CELLS PRESENT FEW GRAM POSITIVE COCCI IN CLUSTERS Performed at [facility], [place] [address], [place], [place] [address]    Culture   Final    FEW STAPHYLOCOCCUS AUREUS NO ANAEROBES ISOLATED; CULTURE IN PROGRESS FOR 5 DAYS    Report Status PENDING  Incomplete   Organism ID, Bacteria STAPHYLOCOCCUS AUREUS  Final      Susceptibility   Staphylococcus aureus - MIC*    CIPROFLOXACIN <=0.5 SENSITIVE Sensitive     ERYTHROMYCIN <=0.25 SENSITIVE Sensitive     GENTAMICIN <=0.5 SENSITIVE Sensitive     OXACILLIN <=0.25 SENSITIVE Sensitive     TETRACYCLINE <=1 SENSITIVE Sensitive     VANCOMYCIN 1 SENSITIVE Sensitive     TRIMETH/SULFA <=10 SENSITIVE Sensitive     CLINDAMYCIN <=0.25 SENSITIVE Sensitive     RIFAMPIN <=0.5 SENSITIVE Sensitive     Inducible Clindamycin NEGATIVE Sensitive     * FEW STAPHYLOCOCCUS AUREUS    Coagulation Studies: No results for input(s): LABPROT, INR in the last 72 hours.  Urinalysis: No results for input(s): COLORURINE, LABSPEC, PHURINE, GLUCOSEU, HGBUR, BILIRUBINUR, KETONESUR, PROTEINUR, UROBILINOGEN, NITRITE, LEUKOCYTESUR in the last 72 hours.  Invalid input(s): APPERANCEUR    Imaging: No results found.   Medications:   . piperacillin-tazobactam (ZOSYN)  IV 3.375 g (10/10/17 6834)  . vancomycin Stopped (10/09/17 1015)   . amLODipine  10 mg Oral Daily  . atenolol  25 mg Oral Daily  . docusate sodium  100 mg Oral BID  . feeding supplement (NEPRO CARB STEADY)  237 mL Oral BID BM  . heparin injection (subcutaneous)  5,000 Units Subcutaneous Q8H  . Influenza vac split quadrivalent PF  0.5 mL Intramuscular Tomorrow-1000  . insulin aspart  0-15 Units Subcutaneous TID WC  . insulin aspart  2 Units Subcutaneous TID WC  . insulin glargine  17 Units Subcutaneous BID  . pneumococcal 23 valent vaccine  0.5 mL Intramuscular  Tomorrow-1000   acetaminophen **OR** acetaminophen, bisacodyl, morphine injection, ondansetron **OR** ondansetron (ZOFRAN) IV, oxyCODONE-acetaminophen  Assessment/ Plan:  47 y.o. black male with diabetes mellitus type II, diabetic retinopathy and neuropathy, hypertension, obesity, venous insufficiency, angioedema with ACE-I  who was admitted to [facility] 10/01/2017 for left leg abscess and cellulitis.  1. Acute Renal Failure on Chronic Kidney Disease Stage III with Proteinuria - Baseline = Creatinine: 1.6; GFR= 58 (8/18) - CKD secondary to Diabetic Nephropathy. A1C 15.1 (10/01/17). Acute renal failure with active infection of cellulitis. Urinalysis and protein to creatinine ratio 0.82. Normal renal ultrasound.  -Renal function is very slowly improving.  Creatinine currently down to 2.5.  Patient may establish a new baseline but we will continue to monitor renal parameters daily for now.  2. Hypertension with LE edema  -Continue amlodipine and atenolol.  3. DM2 with CKD and insulin dependent. -Hemoglobin A1C 15.1%.    -Allergy to ACE-I -Hold metformin d/t ARF  4. LLE cellulitis.Abx as per IM team  -Surgical debridement (10/03/17) - Wound vac in place.    LOS: 9 Onie Kasparek 2/11/201912:30 PM  [place], [place]

## 2017-10-11 ENCOUNTER — Other Ambulatory Visit: Payer: Self-pay

## 2017-10-11 ENCOUNTER — Encounter
Admission: EM | Disposition: A | Discharge status: Skilled Nursing Facility | Payer: Self-pay | Source: Home / Self Care | Attending: Internal Medicine

## 2017-10-11 ENCOUNTER — Inpatient Hospital Stay: Payer: BLUE CROSS/BLUE SHIELD | Admitting: Certified Registered Nurse Anesthetist

## 2017-10-11 ENCOUNTER — Encounter (INDEPENDENT_AMBULATORY_CARE_PROVIDER_SITE_OTHER): Payer: BLUE CROSS/BLUE SHIELD

## 2017-10-11 ENCOUNTER — Ambulatory Visit (INDEPENDENT_AMBULATORY_CARE_PROVIDER_SITE_OTHER): Payer: BLUE CROSS/BLUE SHIELD | Admitting: Vascular Surgery

## 2017-10-11 DIAGNOSIS — L03116 Cellulitis of left lower limb: Secondary | ICD-10-CM

## 2017-10-11 HISTORY — PX: I & D EXTREMITY: SHX5045

## 2017-10-11 HISTORY — PX: APPLICATION OF WOUND VAC: SHX5189

## 2017-10-11 LAB — BASIC METABOLIC PANEL
ANION GAP: 6 (ref 5–15)
BUN: 35 mg/dL — ABNORMAL HIGH (ref 6–20)
CHLORIDE: 109 mmol/L (ref 101–111)
CO2: 22 mmol/L (ref 22–32)
Calcium: 8.8 mg/dL — ABNORMAL LOW (ref 8.9–10.3)
Creatinine, Ser: 2.54 mg/dL — ABNORMAL HIGH (ref 0.61–1.24)
GFR calc Af Amer: 33 mL/min — ABNORMAL LOW (ref >60)
GFR, EST NON AFRICAN AMERICAN: 28 mL/min — AB (ref >60)
GLUCOSE: 78 mg/dL (ref 65–99)
POTASSIUM: 4.6 mmol/L (ref 3.5–5.1)
Sodium: 137 mmol/L (ref 135–145)

## 2017-10-11 LAB — VANCOMYCIN, RANDOM: Vancomycin Rm: 14

## 2017-10-11 LAB — GLUCOSE, CAPILLARY
GLUCOSE-CAPILLARY: 67 mg/dL (ref 65–99)
GLUCOSE-CAPILLARY: 70 mg/dL (ref 65–99)
GLUCOSE-CAPILLARY: 75 mg/dL (ref 65–99)
GLUCOSE-CAPILLARY: 78 mg/dL (ref 65–99)
GLUCOSE-CAPILLARY: 179 mg/dL — AB (ref 65–99)
Glucose-Capillary: 101 mg/dL — ABNORMAL HIGH (ref 65–99)
Glucose-Capillary: 72 mg/dL (ref 65–99)
Glucose-Capillary: 76 mg/dL (ref 65–99)
Glucose-Capillary: 83 mg/dL (ref 65–99)
Glucose-Capillary: 81 mg/dL (ref 65–99)

## 2017-10-11 SURGERY — APPLICATION, WOUND VAC
Anesthesia: General | Laterality: Left

## 2017-10-11 MED ORDER — BUPIVACAINE-EPINEPHRINE (PF) 0.5% -1:200000 IJ SOLN
INTRAMUSCULAR | Status: AC
  Filled 2017-10-11: qty 30

## 2017-10-11 MED ORDER — PROMETHAZINE HCL 25 MG/ML IJ SOLN: 6.2500 mg | INTRAMUSCULAR | Status: DC | PRN

## 2017-10-11 MED ORDER — MEPERIDINE HCL 50 MG/ML IJ SOLN: 6.2500 mg | INTRAMUSCULAR | Status: DC | PRN

## 2017-10-11 MED ORDER — FENTANYL CITRATE (PF) 100 MCG/2ML IJ SOLN
INTRAMUSCULAR | Status: AC
  Filled 2017-10-11: qty 2

## 2017-10-11 MED ORDER — FENTANYL CITRATE (PF) 100 MCG/2ML IJ SOLN: 25.0000 ug | INTRAMUSCULAR | Status: DC | PRN

## 2017-10-11 MED ORDER — SODIUM CHLORIDE 0.9 % IV SOLN
INTRAVENOUS | Status: DC | PRN
  Administered 2017-10-11: 17:00:00 via INTRAVENOUS

## 2017-10-11 MED ORDER — SUCCINYLCHOLINE CHLORIDE 20 MG/ML IJ SOLN
INTRAMUSCULAR | Status: DC | PRN
  Administered 2017-10-11: 160 mg via INTRAVENOUS

## 2017-10-11 MED ORDER — LIDOCAINE HCL (CARDIAC) 20 MG/ML IV SOLN
INTRAVENOUS | Status: DC | PRN
  Administered 2017-10-11: 100 mg via INTRAVENOUS

## 2017-10-11 MED ORDER — BUPIVACAINE-EPINEPHRINE (PF) 0.5% -1:200000 IJ SOLN
INTRAMUSCULAR | Status: DC | PRN
  Administered 2017-10-11: 10 mL

## 2017-10-11 MED ORDER — DEXTROSE-NACL 5-0.9 % IV SOLN
INTRAVENOUS | Status: DC
  Administered 2017-10-11 – 2017-10-12 (×2): via INTRAVENOUS

## 2017-10-11 MED ORDER — PROPOFOL 10 MG/ML IV BOLUS
INTRAVENOUS | Status: AC
Start: 2017-10-11 — End: 2017-10-11
  Filled 2017-10-11: qty 20

## 2017-10-11 MED ORDER — DEXTROSE-NACL 5-0.9 % IV SOLN: INTRAVENOUS | Status: DC

## 2017-10-11 MED ORDER — VANCOMYCIN HCL 10 G IV SOLR
1500.0000 mg | Freq: Once | INTRAVENOUS | Status: AC
  Administered 2017-10-11: 1500 mg via INTRAVENOUS
  Filled 2017-10-11: qty 1500

## 2017-10-11 MED ORDER — FENTANYL CITRATE (PF) 100 MCG/2ML IJ SOLN
INTRAMUSCULAR | Status: DC | PRN
  Administered 2017-10-11 (×2): 50 ug via INTRAVENOUS

## 2017-10-11 MED ORDER — DEXAMETHASONE SODIUM PHOSPHATE 10 MG/ML IJ SOLN
INTRAMUSCULAR | Status: DC | PRN
  Administered 2017-10-11: 5 mg via INTRAVENOUS

## 2017-10-11 MED ORDER — MIDAZOLAM HCL 2 MG/2ML IJ SOLN
INTRAMUSCULAR | Status: AC
  Filled 2017-10-11: qty 2

## 2017-10-11 MED ORDER — SODIUM CHLORIDE 0.9 % IV SOLN
INTRAVENOUS | Status: DC
  Administered 2017-10-11: 10:00:00 via INTRAVENOUS

## 2017-10-11 MED ORDER — GLYCOPYRROLATE 0.2 MG/ML IJ SOLN
INTRAMUSCULAR | Status: DC | PRN
  Administered 2017-10-11: 0.2 mg via INTRAVENOUS

## 2017-10-11 MED ORDER — PROPOFOL 10 MG/ML IV BOLUS
INTRAVENOUS | Status: DC | PRN
  Administered 2017-10-11: 70 mg via INTRAVENOUS
  Administered 2017-10-11: 230 mg via INTRAVENOUS

## 2017-10-11 MED ORDER — OXYCODONE HCL 5 MG/5ML PO SOLN: 5.0000 mg | Freq: Once | ORAL | Status: DC | PRN

## 2017-10-11 MED ORDER — OXYCODONE HCL 5 MG PO TABS: 5.0000 mg | ORAL_TABLET | Freq: Once | ORAL | Status: DC | PRN

## 2017-10-11 MED ORDER — MIDAZOLAM HCL 2 MG/2ML IJ SOLN
INTRAMUSCULAR | Status: DC | PRN
  Administered 2017-10-11: 2 mg via INTRAVENOUS

## 2017-10-11 MED ORDER — PREMIER PROTEIN SHAKE
11.0000 [oz_av] | Freq: Two times a day (BID) | ORAL | Status: DC
  Administered 2017-10-13 – 2017-10-19 (×7): 11 [oz_av] via ORAL

## 2017-10-11 MED ORDER — SUCCINYLCHOLINE CHLORIDE 20 MG/ML IJ SOLN
INTRAMUSCULAR | Status: AC
  Filled 2017-10-11: qty 1

## 2017-10-11 MED ORDER — ONDANSETRON HCL 4 MG/2ML IJ SOLN
INTRAMUSCULAR | Status: DC | PRN
  Administered 2017-10-11: 4 mg via INTRAVENOUS

## 2017-10-11 MED ORDER — ONDANSETRON HCL 4 MG/2ML IJ SOLN
INTRAMUSCULAR | Status: AC
  Filled 2017-10-11: qty 2

## 2017-10-11 MED ORDER — LIDOCAINE HCL (PF) 2 % IJ SOLN
INTRAMUSCULAR | Status: AC
  Filled 2017-10-11: qty 10

## 2017-10-11 SURGICAL SUPPLY — 20 items
CANISTER SUCT 1200ML W/VALVE (MISCELLANEOUS) ×3 IMPLANT
DRSG VAC ATS MED SENSATRAC (GAUZE/BANDAGES/DRESSINGS) ×3 IMPLANT
DURAPREP 26ML APPLICATOR (WOUND CARE) IMPLANT
ELECT REM PT RETURN 9FT ADLT (ELECTROSURGICAL) ×3
ELECTRODE REM PT RTRN 9FT ADLT (ELECTROSURGICAL) ×1 IMPLANT
GAUZE SPONGE 4X4 12PLY STRL (GAUZE/BANDAGES/DRESSINGS) IMPLANT
GAUZE STRETCH 2X75IN STRL (MISCELLANEOUS) ×3 IMPLANT
GLOVE SURG SYN 8.0 (GLOVE) ×3 IMPLANT
GOWN STRL REUS W/ TWL LRG LVL3 (GOWN DISPOSABLE) ×1 IMPLANT
GOWN STRL REUS W/ TWL XL LVL3 (GOWN DISPOSABLE) ×1 IMPLANT
GOWN STRL REUS W/TWL LRG LVL3 (GOWN DISPOSABLE) ×2
GOWN STRL REUS W/TWL XL LVL3 (GOWN DISPOSABLE) ×2
KIT TURNOVER KIT A (KITS) ×3 IMPLANT
LABEL OR SOLS (LABEL) ×3 IMPLANT
NS IRRIG 500ML POUR BTL (IV SOLUTION) ×3 IMPLANT
OINTMENT BETADINE 1.5GM (MISCELLANEOUS) ×3 IMPLANT
PACK EXTREMITY ARMC (MISCELLANEOUS) ×3 IMPLANT
PAD NEG PRESSURE SENSATRAC (MISCELLANEOUS) ×3 IMPLANT
STOCKINETTE STRL 4IN 9604848 (GAUZE/BANDAGES/DRESSINGS) ×3 IMPLANT
WND VAC CANISTER 500ML (MISCELLANEOUS) ×3 IMPLANT

## 2017-10-11 NOTE — Progress Notes (Signed)
[place], [place] 10/11/17  Subjective:  Patient seen at bedside. No new renal function testing performed today. Due for wound VAC change today.  Objective:  Vital signs in last 24 hours:  Temp:  [97.2 F (36.2 C)-99.1 F (37.3 C)] 97.2 F (36.2 C) (02/12 1413) Pulse Rate:  [62-74] 62 (02/12 1413) Resp:  [18-20] 20 (02/12 1413) BP: (129-154)/(62-81) 139/81 (02/12 1413) SpO2:  [96 %-100 %] 100 % (02/12 1413) Weight:  [188.9 kg (416 lb 7.2 oz)] 188.9 kg (416 lb 7.2 oz) (02/12 0500)  Weight change: 6.146 kg (13 lb 8.8 oz) Filed Weights   10/09/17 0404 10/10/17 0401 10/11/17 0500  Weight: (!) 182.3 kg (402 lb) (!) 182.8 kg (402 lb 14.4 oz) (!) 188.9 kg (416 lb 7.2 oz)    Intake/Output:    Intake/Output Summary (Last 24 hours) at 10/11/2017 1535 Last data filed at 10/11/2017 1320 Gross per 24 hour  Intake 450 ml  Output 950 ml  Net -500 ml     Physical Exam: General: No acute distress  HEENT [place]/AT Mucous membranes moist.   Neck supple  Pulm/lungs Lungs clear to auscultation bilaterally. No wheezing.   CVS/Heart Regular rate and rhythm. No murmurs, rubs, gallops.   Abdomen:  Soft and nontender, BS present  Extremities: 2+ b/l LE edema  Neurologic: AAOX3. Speech and affect normal.  Skin: LLE edema with erythema; wound vac on left heel          Basic Metabolic Panel:  Recent Labs  Lab 10/05/17 0305 10/06/17 0557 10/08/17 0355 10/09/17 0334 10/10/17 0313 10/11/17 0343  NA 136 139  --   --  137 137  K 3.9 4.1  --   --  4.5 4.6  CL 107 108  --   --  109 109  CO2 23 23  --   --  21* 22  GLUCOSE 189* 131*  --   --  108* 78  BUN 52* 46*  --   --  34* 35*  CREATININE 2.81* 2.71* 2.85* 2.70* 2.50* 2.54*  CALCIUM 8.1* 8.5*  --   --  8.5* 8.8*  MG  --  2.0  --   --   --   --   PHOS 3.9  --   --   --   --   --      CBC: Recent Labs  Lab 10/06/17 0557 10/10/17 0313  WBC 9.1 7.3  HGB 9.6* 8.9*  HCT 29.3* 26.8*  MCV 88.9 88.8   PLT 290 409     No results found for: HEPBSAG, HEPBSAB, HEPBIGM    Microbiology:  Recent Results (from the past 240 hour(s))  MRSA PCR Screening     Status: None   Collection Time: 10/03/17  8:02 AM  Result Value Ref Range Status   MRSA by PCR NEGATIVE NEGATIVE Final    Comment:        The GeneXpert MRSA Assay (FDA approved for NASAL specimens only), is one component of a comprehensive MRSA colonization surveillance program. It is not intended to diagnose MRSA infection nor to guide or monitor treatment for MRSA infections. Performed at [facility], [address] [place], [place] [address]   Aerobic/Anaerobic Culture (surgical/deep wound)     Status: None (Preliminary result)   Collection Time: 10/07/17  8:25 AM  Result Value Ref Range Status   Specimen Description   Final    WOUND Performed at [facility], [place],  [place] [address]    Special Requests   Final    PUS FROM R LEG ABSCESS Performed at [facility], [place], [place] [address]    Gram Stain   Final    MODERATE WBC PRESENT,BOTH PMN AND MONONUCLEAR FEW SQUAMOUS EPITHELIAL CELLS PRESENT FEW GRAM POSITIVE COCCI IN CLUSTERS Performed at [facility], [place] [address] [place], [place] [address]    Culture   Final    FEW STAPHYLOCOCCUS AUREUS NO ANAEROBES ISOLATED; CULTURE IN PROGRESS FOR 5 DAYS    Report Status PENDING  Incomplete   Organism ID, Bacteria STAPHYLOCOCCUS AUREUS  Final      Susceptibility   Staphylococcus aureus - MIC*    CIPROFLOXACIN <=0.5 SENSITIVE Sensitive     ERYTHROMYCIN <=0.25 SENSITIVE Sensitive     GENTAMICIN <=0.5 SENSITIVE Sensitive     OXACILLIN <=0.25 SENSITIVE Sensitive     TETRACYCLINE <=1 SENSITIVE Sensitive     VANCOMYCIN 1 SENSITIVE Sensitive     TRIMETH/SULFA <=10 SENSITIVE Sensitive     CLINDAMYCIN <=0.25 SENSITIVE Sensitive     RIFAMPIN <=0.5 SENSITIVE Sensitive     Inducible Clindamycin  NEGATIVE Sensitive     * FEW STAPHYLOCOCCUS AUREUS    Coagulation Studies: No results for input(s): LABPROT, INR in the last 72 hours.  Urinalysis: No results for input(s): COLORURINE, LABSPEC, PHURINE, GLUCOSEU, HGBUR, BILIRUBINUR, KETONESUR, PROTEINUR, UROBILINOGEN, NITRITE, LEUKOCYTESUR in the last 72 hours.  Invalid input(s): APPERANCEUR    Imaging: No results found.   Medications:   . dextrose 5 % and 0.9% NaCl 75 mL/hr at 10/11/17 1112  . [MAR Hold] piperacillin-tazobactam (ZOSYN)  IV 3.375 g (10/11/17 1320)   . [MAR Hold] amLODipine  10 mg Oral Daily  . [MAR Hold] atenolol  25 mg Oral Daily  . [MAR Hold] docusate sodium  100 mg Oral BID  . [MAR Hold] heparin injection (subcutaneous)  5,000 Units Subcutaneous Q8H  . [MAR Hold] Influenza vac split quadrivalent PF  0.5 mL Intramuscular Tomorrow-1000  . [MAR Hold] insulin aspart  0-15 Units Subcutaneous TID WC  . [MAR Hold] insulin aspart  2 Units Subcutaneous TID WC  . [MAR Hold] insulin glargine  17 Units Subcutaneous BID  . [MAR Hold] multivitamin with minerals  1 tablet Oral Daily  . [MAR Hold] pneumococcal 23 valent vaccine  0.5 mL Intramuscular Tomorrow-1000  . [MAR Hold] protein supplement shake  11 oz Oral BID BM  . [MAR Hold] vitamin C  500 mg Oral BID   [MAR Hold] acetaminophen **OR** [MAR Hold] acetaminophen, [MAR Hold] bisacodyl, [MAR Hold]  morphine injection, [MAR Hold] ondansetron **OR** [MAR Hold] ondansetron (ZOFRAN) IV, [MAR Hold] oxyCODONE-acetaminophen  Assessment/ Plan:  47 y.o. black male with diabetes mellitus type II, diabetic retinopathy and neuropathy, hypertension, obesity, venous insufficiency, angioedema with ACE-I who was admitted to [facility] 10/01/2017 for left leg abscess and cellulitis.  1. Acute Renal Failure on Chronic Kidney Disease Stage III with Proteinuria - Baseline = Creatinine: 1.6; GFR= 58 (8/18) - CKD secondary to Diabetic Nephropathy. A1C 15.1 (10/01/17). Acute renal failure with  active infection of cellulitis. Urinalysis and protein to creatinine ratio 0.82. Normal renal ultrasound.  - recommend rechecking renal function tomorrow.  No new renal function testing available today.  2. Hypertension with LE edema  -Blood pressure currently 139/81.  Maintain the patient on current doses of amlodipine and metoprolol.  3. DM2 with CKD and insulin dependent. -Hemoglobin A1C 15.1%.    -Allergy to ACE-I -Hold metformin  d/t ARF  4. LLE cellulitis.Abx as per IM team  -Surgical debridement (10/03/17) - Wound vac in place but to be changed out today.    LOS: 10 [name] 2/12/20193:35 PM  [place], [place]

## 2017-10-11 NOTE — Progress Notes (Signed)
[place] at [place] NAME: [name]    MR#:  [id number]  DATE OF BIRTH:  09-24-1970  SUBJECTIVE:  Patient is scheduled to get wound VAC change in the OR by Dr. Delana Meyer today, patient is hypoglycemic as he is n.p.o.  REVIEW OF SYSTEMS:   Review of Systems  Constitutional: Negative for chills, fever and weight loss.  HENT: Negative for ear discharge, ear pain and nosebleeds.   Eyes: Negative for blurred vision, pain and discharge.  Respiratory: Negative for sputum production, shortness of breath, wheezing and stridor.   Cardiovascular: Negative for chest pain, palpitations, orthopnea and PND.  Gastrointestinal: Negative for abdominal pain, diarrhea, nausea and vomiting.  Genitourinary: Negative for frequency and urgency.  Musculoskeletal: Negative for back pain and joint pain.  Neurological: Positive for weakness. Negative for sensory change, speech change and focal weakness.  Psychiatric/Behavioral: Negative for depression and hallucinations. The patient is not nervous/anxious.    Tolerating Diet:yes Tolerating PT: Rehab  DRUG ALLERGIES:   Allergies  Allergen Reactions  . Lisinopril Swelling    VITALS:  Blood pressure (!) 151/74, pulse 64, temperature 98.9 F (37.2 C), temperature source Oral, resp. rate 20, height 6\' 4"  (1.93 m), weight (!) 188.9 kg (416 lb 7.2 oz), SpO2 99 %.  PHYSICAL EXAMINATION:   Physical Exam  GENERAL:  47 y.o.-year-old patient lying in the bed with no acute distress.  EYES: Pupils equal, round, reactive to light and accommodation. No scleral icterus. Extraocular muscles intact.  HEENT: Head atraumatic, normocephalic. Oropharynx and nasopharynx clear.  NECK:  Supple, no jugular venous distention. No thyroid enlargement, no tenderness.  LUNGS: Normal breath sounds bilaterally, no wheezing, rales, rhonchi. No use of accessory muscles of respiration.  CARDIOVASCULAR: S1, S2 normal. No murmurs, rubs,  or gallops.  ABDOMEN: Soft, nontender, nondistended. Bowel sounds present. No organomegaly or mass.  EXTREMITIES: Left leg edema with wound vac+ NEUROLOGIC: Cranial nerves II through XII are intact. No focal Motor or sensory deficits b/l.   PSYCHIATRIC:  patient is alert and oriented x 3.  SKIN: No obvious rash, lesion, or ulcer.   LABORATORY PANEL:  CBC Recent Labs  Lab 10/10/17 0313  WBC 7.3  HGB 8.9*  HCT 26.8*  PLT 409    Chemistries  Recent Labs  Lab 10/06/17 0557  10/11/17 0343  NA 139   < > 137  K 4.1   < > 4.6  CL 108   < > 109  CO2 23   < > 22  GLUCOSE 131*   < > 78  BUN 46*   < > 35*  CREATININE 2.71*   < > 2.54*  CALCIUM 8.5*   < > 8.8*  MG 2.0  --   --    < > = values in this interval not displayed.   Cardiac Enzymes No results for input(s): TROPONINI in the last 168 hours. RADIOLOGY:  No results found. ASSESSMENT AND PLAN:  [name] is a 47 y.o. male has a past medical history significant for HTN and DM recently seen by [name] for LLE edema now with worsening pain and swelling of LLE with bloody discharge. Also c/o worsening hyperglycemia and nausea. No fever. Denies CP or SOB.   1. Left lower extremity cellulitis/necrotic area with severe chronic venous stasis dermatitis and venous ulceration. -Appreciate Dr. Nino Parsley input -On empirically IV Zosyn and vanc -Blood cultures negative.  White count normal -s/p I and d and debridement of  left leg abscess/necrotic area  Wound vac + -Wound VAC change per Dr. Delana Meyer on 10/07/2017--- per vascular patient will need couple more wound VAC changes in the OR given the extent and severity of the wound.  Might consider changing wound VAC dressing tomorrow or on Wednesday Follow-up wound cultures that were sent on 10/07/2017-Staphylococcus aureus pansensitive  2.Uncontrolled type 2 diabetes hemoglobin A1c 15.1- -patient is n.p.o. and hypoglycemic adding D5 normal saline to the regimen -insulin Lantus 17  unitstwice a day is on hold and continue meal coverage with insulin aspart after procedure and continue sliding scale -DC metformin given elevated creatinine  3.Hypertension  -continue home meds  4.  Acute on chronic CKD stage III with proteinuria secondary to diabetic nephropathy -Avoid nephrotoxins Patient's baseline creatinine is 1.6 -Creatinine 2.22--- fluids--- 2.10--2.32--2.8-2.70- 2.5, this may be patient's new baseline but continue to monitor closely -Nephrology input appreciated  5.  DVT prophylaxis subcu heparin  PT recommends rehab. Pt will benefit given wound vac and need for changes   Case discussed with Care Management/Social Worker. Management plans discussed with the patient, family and they are in agreement.  CODE STATUS: Full  DVT Prophylaxis: Heparin  TOTAL TIME TAKING CARE OF THIS PATIENT: 39minutes.  >50% time spent on counselling and coordination of care  POSSIBLE D/C unclear DAYS, DEPENDING ON CLINICAL CONDITION number of wound VAC changes in the OR needed.  Note: This dictation was prepared with Dragon dictation along with smaller phrase technology. Any transcriptional errors that result from this process are unintentional.  Nicholes Mango M.D on 10/11/2017 at 11:13 AM  Between 7am to 6pm - Pager - [phone]  After 6pm go to [email or web address] - password [email or web address] [facility]  Office  [phone]  CC: Primary care physician; Sallee Lange, NPPatient ID: [name], male   DOB: 30-Jan-1971, 47 y.o.   MRN: [id number]

## 2017-10-11 NOTE — Progress Notes (Signed)
Per MD patient is not stable for D/C today. Per [facility] admissions coordinator at [facility] they received another [facility] authorization starting today and is good for 7 days (through Monday 10/17/17).  [employer], LCSW [phone]

## 2017-10-11 NOTE — Anesthesia Post-op Follow-up Note (Signed)
Anesthesia QCDR form completed.        

## 2017-10-11 NOTE — Anesthesia Postprocedure Evaluation (Signed)
Anesthesia Post Note  Patient: Welborn Keyvin Rison  Procedure(s) Performed: APPLICATION OF WOUND VAC (Left ) IRRIGATION AND DEBRIDEMENT EXTREMITY (Left )  Patient location during evaluation: PACU Anesthesia Type: General Level of consciousness: awake and alert Pain management: pain level controlled Vital Signs Assessment: post-procedure vital signs reviewed and stable Respiratory status: spontaneous breathing, nonlabored ventilation, respiratory function stable and patient connected to nasal cannula oxygen Cardiovascular status: blood pressure returned to baseline and stable Postop Assessment: no apparent nausea or vomiting Anesthetic complications: no     Last Vitals:  Vitals:   10/11/17 1949 10/11/17 2022  BP: 140/67 (!) 157/84  Pulse: 64 63  Resp: 19 19  Temp: (!) 36.4 C 36.6 C  SpO2: 92% 90%    Last Pain:  Vitals:   10/11/17 2022  TempSrc: Oral  PainSc:                  Molli Barrows

## 2017-10-11 NOTE — Anesthesia Preprocedure Evaluation (Signed)
Anesthesia Evaluation  Patient identified by MRN, date of birth, ID band Patient awake    Reviewed: Allergy & Precautions, NPO status , Patient's Chart, lab work & pertinent test results  History of Anesthesia Complications Negative for: history of anesthetic complications  Airway Mallampati: II  TM Distance: >3 FB Neck ROM: Full    Dental  (+) Partial Upper   Pulmonary neg pulmonary ROS, neg sleep apnea, neg COPD,    breath sounds clear to auscultation- rhonchi (-) wheezing      Cardiovascular hypertension, Pt. on medications + Peripheral Vascular Disease  (-) CAD, (-) Past MI, (-) Cardiac Stents and (-) CABG  Rhythm:Regular Rate:Normal - Systolic murmurs and - Diastolic murmurs    Neuro/Psych negative neurological ROS  negative psych ROS   GI/Hepatic negative GI ROS, Neg liver ROS,   Endo/Other  diabetes, Insulin Dependent  Renal/GU Renal InsufficiencyRenal disease     Musculoskeletal negative musculoskeletal ROS (+)   Abdominal (+) + obese,   Peds  Hematology negative hematology ROS (+)   Anesthesia Other Findings Past Medical History: No date: Diabetes mellitus without complication (HCC) No date: Hypertension No date: Microalbuminuria No date: Vitamin D deficiency   Reproductive/Obstetrics                             Anesthesia Physical  Anesthesia Plan  ASA: III  Anesthesia Plan: General   Post-op Pain Management:    Induction: Intravenous  PONV Risk Score and Plan: 1 and Dexamethasone and Ondansetron  Airway Management Planned: Oral ETT  Additional Equipment:   Intra-op Plan:   Post-operative Plan: Extubation in OR  Informed Consent: I have reviewed the patients History and Physical, chart, labs and discussed the procedure including the risks, benefits and alternatives for the proposed anesthesia with the patient or authorized representative who has indicated  his/her understanding and acceptance.   Dental advisory given  Plan Discussed with: CRNA and Anesthesiologist  Anesthesia Plan Comments:         Anesthesia Quick Evaluation

## 2017-10-11 NOTE — Transfer of Care (Signed)
Immediate Anesthesia Transfer of Care Note  Patient: [name]  Procedure(s) Performed: APPLICATION OF WOUND VAC (Left ) IRRIGATION AND DEBRIDEMENT EXTREMITY (Left )  Patient Location: PACU  Anesthesia Type:General  Level of Consciousness: drowsy and patient cooperative  Airway & Oxygen Therapy: Patient Spontanous Breathing and Patient connected to face mask oxygen  Post-op Assessment: Report given to RN and Post -op Vital signs reviewed and stable  Post vital signs: Reviewed and stable  Last Vitals:  Vitals:   10/11/17 1413 10/11/17 1831  BP: 139/81   Pulse: 62 67  Resp: 20   Temp: (!) 36.2 C (!) 36.3 C  SpO2: 100%     Last Pain:  Vitals:   10/11/17 1413  TempSrc: Temporal  PainSc:       Patients Stated Pain Goal: 2 (41/14/64 3142)  Complications: No apparent anesthesia complications

## 2017-10-11 NOTE — Anesthesia Procedure Notes (Signed)
Procedure Name: Intubation Date/Time: 10/11/2017 4:55 PM Performed by: Doreen Salvage, CRNA Pre-anesthesia Checklist: Patient identified, Patient being monitored, Timeout performed, Emergency Drugs available and Suction available Patient Re-evaluated:Patient Re-evaluated prior to induction Oxygen Delivery Method: Circle system utilized Preoxygenation: Pre-oxygenation with 100% oxygen Induction Type: IV induction Ventilation: Mask ventilation without difficulty Laryngoscope Size: Mac, McGraph and 4 Grade View: Grade I Tube type: Oral Tube size: 8.0 mm Number of attempts: 1 Airway Equipment and Method: Stylet Placement Confirmation: ETT inserted through vocal cords under direct vision,  positive ETCO2 and breath sounds checked- equal and bilateral Secured at: 24 cm Tube secured with: Tape Dental Injury: Teeth and Oropharynx as per pre-operative assessment

## 2017-10-11 NOTE — Progress Notes (Signed)
Pharmacy Antibiotic Note  [name] is a 47 y.o. male admitted on 10/01/2017 with cellulitis.  Pharmacy has been consulted for zosyn and vancomycin dosing.  Plan: Vancomycin 1500mg  IV every 12 hours.  Goal trough 10-15 mcg/mL. Zosyn 3.375g IV q8h (4 hour infusion).   02/04 @ 0130 VT 36 supratherapeutic drawn appropriately. Scr rising, will hold off vanc for now and will check a random level in 12 hours ~ patient expected to be around 18 mcg/mL in 12 hours. Will reassess regimen once level < 20 mcg/mL  02/04 1642 Vanc random 24. Level still elevated. Will order Vanc random for 2/5 0130 (24 hours after trough level).   02/05 0200 vanc level 18. Will give one time dose of 1500 mg and recheck random vanc with 2/6 AM labs.  02/06 AM vanc level 23. No dose ordered. Recheck random vanc tomorrow AM.  2/7 AM vanc level 16. 1500 mg x1 ordered. Calculated kei 0.13 hr-1, t1/2 50 hours. Recheck vanc random tomorrow AM .  2/8 AM vanc 19. Will give 1x dose of 1500 mg and recheck random level with tomorrow AM labs. SCr also in AM.  2/9 AM vanc level 27. No dose ordered. F/u random level in AM. SCr 2.85.  2/10 AM vanc level 17. Will start vanc 1500 mg q 48 hours. Level before 2/12 dose is due.  02/12 @ 0343 VR 14. Will give vanc 1.5g IV x 1 and recheck another random level w/ am labs considering patient is in AKI.  Height: 6\' 4"  (193 cm) Weight: (!) 402 lb 14.4 oz (182.8 kg) IBW/kg (Calculated) : 86.8  Temp (24hrs), Avg:98.8 F (37.1 C), Min:98.5 F (36.9 C), Max:99.1 F (37.3 C)  Recent Labs  Lab 10/06/17 0557  10/08/17 0355 10/09/17 0334 10/10/17 0313 10/11/17 0343  WBC 9.1  --   --   --  7.3  --   CREATININE 2.71*  --  2.85* 2.70* 2.50* 2.54*  VANCORANDOM 16   < > 27 17  --  14   < > = values in this interval not displayed.    Estimated Creatinine Clearance: 63 mL/min (A) (by C-G formula based on SCr of 2.54 mg/dL (H)).    Allergies  Allergen Reactions  . Lisinopril  Swelling    Antimicrobials this admission: Anti-infectives (From admission, onward)   Start     Dose/Rate Route Frequency Ordered Stop   10/11/17 0545  vancomycin (VANCOCIN) 1,500 mg in sodium chloride 0.9 % 500 mL IVPB     1,500 mg 250 mL/hr over 120 Minutes Intravenous  Once 10/11/17 0538     10/09/17 0800  vancomycin (VANCOCIN) 1,500 mg in sodium chloride 0.9 % 500 mL IVPB  Status:  Discontinued     1,500 mg 250 mL/hr over 120 Minutes Intravenous Every 48 hours 10/09/17 0540 10/11/17 0538   10/07/17 0600  vancomycin (VANCOCIN) 1,500 mg in sodium chloride 0.9 % 500 mL IVPB     1,500 mg 250 mL/hr over 120 Minutes Intravenous  Once 10/07/17 0556 10/07/17 0824   10/06/17 0800  vancomycin (VANCOCIN) 1,500 mg in sodium chloride 0.9 % 500 mL IVPB     1,500 mg 250 mL/hr over 120 Minutes Intravenous  Once 10/06/17 0715 10/06/17 1054   10/04/17 0600  vancomycin (VANCOCIN) 1,500 mg in sodium chloride 0.9 % 500 mL IVPB     1,500 mg 250 mL/hr over 120 Minutes Intravenous  Once 10/04/17 0426 10/04/17 1303   10/03/17 1330  ceFAZolin (ANCEF) IVPB  1 g/50 mL premix     1 g 100 mL/hr over 30 Minutes Intravenous  Once 10/03/17 1323 10/03/17 1450   10/03/17 1320  ceFAZolin (ANCEF) 1-4 GM/50ML-% IVPB    Comments:  [place]   : cabinet override      10/03/17 1320 10/03/17 1420   10/01/17 1800  piperacillin-tazobactam (ZOSYN) IVPB 3.375 g     3.375 g 12.5 mL/hr over 240 Minutes Intravenous Every 8 hours 10/01/17 1133     10/01/17 1400  vancomycin (VANCOCIN) 1,500 mg in sodium chloride 0.9 % 500 mL IVPB  Status:  Discontinued     1,500 mg 250 mL/hr over 120 Minutes Intravenous Every 12 hours 10/01/17 1156 10/03/17 0203   10/01/17 1130  piperacillin-tazobactam (ZOSYN) IVPB 3.375 g     3.375 g 100 mL/hr over 30 Minutes Intravenous  Once 10/01/17 1120 10/01/17 1211   10/01/17 1130  vancomycin (VANCOCIN) IVPB 1000 mg/200 mL premix     1,000 mg 200 mL/hr over 60 Minutes Intravenous  Once 10/01/17  1120 10/01/17 1300      Microbiology results: Recent Results (from the past 240 hour(s))  Blood Culture (routine x 2)     Status: None   Collection Time: 10/01/17 10:41 AM  Result Value Ref Range Status   Specimen Description BLOOD LEFT ANTECUBITAL  Final   Special Requests   Final    BOTTLES DRAWN AEROBIC AND ANAEROBIC Blood Culture results may not be optimal due to an excessive volume of blood received in culture bottles   Culture   Final    NO GROWTH 5 DAYS Performed at [facility], [address] [place], [place] [address]    Report Status 10/06/2017 FINAL  Final  Blood Culture (routine x 2)     Status: None   Collection Time: 10/01/17 11:31 AM  Result Value Ref Range Status   Specimen Description BLOOD BLOOD RIGHT ARM  Final   Special Requests   Final    BOTTLES DRAWN AEROBIC AND ANAEROBIC Blood Culture adequate volume   Culture   Final    NO GROWTH 5 DAYS Performed at [facility], [address] [place], [place] [address]    Report Status 10/06/2017 FINAL  Final  MRSA PCR Screening     Status: None   Collection Time: 10/03/17  8:02 AM  Result Value Ref Range Status   MRSA by PCR NEGATIVE NEGATIVE Final    Comment:        The GeneXpert MRSA Assay (FDA approved for NASAL specimens only), is one component of a comprehensive MRSA colonization surveillance program. It is not intended to diagnose MRSA infection nor to guide or monitor treatment for MRSA infections. Performed at [facility], [address], [place], [place] [address]   Aerobic/Anaerobic Culture (surgical/deep wound)     Status: None (Preliminary result)   Collection Time: 10/07/17  8:25 AM  Result Value Ref Range Status   Specimen Description   Final    WOUND Performed at [facility], [address], [place], [place] [address]    Special Requests   Final    PUS FROM R LEG ABSCESS Performed at [facility], [address] [place], [place]  [address]    Gram Stain   Final    MODERATE WBC PRESENT,BOTH PMN AND MONONUCLEAR FEW SQUAMOUS EPITHELIAL CELLS PRESENT FEW GRAM POSITIVE COCCI IN CLUSTERS Performed at [facility], [place] [address], [place], [place] [address]  Culture   Final    FEW STAPHYLOCOCCUS AUREUS NO ANAEROBES ISOLATED; CULTURE IN PROGRESS FOR 5 DAYS    Report Status PENDING  Incomplete   Organism ID, Bacteria STAPHYLOCOCCUS AUREUS  Final      Susceptibility   Staphylococcus aureus - MIC*    CIPROFLOXACIN <=0.5 SENSITIVE Sensitive     ERYTHROMYCIN <=0.25 SENSITIVE Sensitive     GENTAMICIN <=0.5 SENSITIVE Sensitive     OXACILLIN <=0.25 SENSITIVE Sensitive     TETRACYCLINE <=1 SENSITIVE Sensitive     VANCOMYCIN 1 SENSITIVE Sensitive     TRIMETH/SULFA <=10 SENSITIVE Sensitive     CLINDAMYCIN <=0.25 SENSITIVE Sensitive     RIFAMPIN <=0.5 SENSITIVE Sensitive     Inducible Clindamycin NEGATIVE Sensitive     * FEW STAPHYLOCOCCUS AUREUS   Thank you for allowing pharmacy to be a part of this patient's care.  Tobie Lords, PharmD, BCPS Clinical Pharmacist 10/11/2017 5:40 AM

## 2017-10-11 NOTE — Progress Notes (Signed)
Physical Therapy Treatment Patient Details Name: [name] MRN: [id number] DOB: Sep 29, 1970 Today's Date: 10/11/2017    History of Present Illness  47 y.o.male had second surgery L ankle with debridement and reapplication of vac, R thigh abscess had I and D yesterday at the same time, cellulitis LLE.  NWB previously on LLE and orders were not on chart for WB at all on either leg.  PMHx:  hyperglycemia, LLE cellulitis, HTN, DM, CKD 3, venous stasis ulcers LLE    PT Comments    Transferred with lateral scoot transfer to drop arm recliner with set up and min guard to manage IV and would vac.  Upon getting settled in recliner, bloody drainage noted to be dripping from LE.  Returned to bed and primary nurse notified of leaking.  Pt overall does well with transfers with set up assist and is able to maintain NWB status with lateral scoot transfer.     Follow Up Recommendations  SNF     Equipment Recommendations       Recommendations for Other Services       Precautions / Restrictions Precautions Precautions: Fall Restrictions Weight Bearing Restrictions: Yes LLE Weight Bearing: Non weight bearing    Mobility  Bed Mobility Overal bed mobility: Modified Independent                Transfers Overall transfer level: Needs assistance Equipment used: None Transfers: Lateral/Scoot Transfers          Lateral/Scoot Transfers: Modified independent (Device/Increase time)    Ambulation/Gait                 Stairs            Wheelchair Mobility    Modified Rankin (Stroke Patients Only)       Balance Overall balance assessment: Needs assistance Sitting-balance support: Feet supported Sitting balance-Leahy Scale: Normal                                      Cognition Arousal/Alertness: Awake/alert Behavior During Therapy: WFL for tasks assessed/performed Overall Cognitive Status: Within Functional Limits for tasks assessed                                         Exercises Other Exercises Other Exercises: reviewed HEP    General Comments        Pertinent Vitals/Pain      Home Living                      Prior Function            PT Goals (current goals can now be found in the care plan section) Progress towards PT goals: Progressing toward goals    Frequency    Min 2X/week      PT Plan Current plan remains appropriate    Co-evaluation              AM-PAC PT "6 Clicks" Daily Activity  Outcome Measure  Difficulty turning over in bed (including adjusting bedclothes, sheets and blankets)?: None Difficulty moving from lying on back to sitting on the side of the bed? : None Difficulty sitting down on and standing up from a chair with arms (e.g., wheelchair, bedside commode, etc,.)?: Unable Help needed moving to and from a  bed to chair (including a wheelchair)?: A Little Help needed walking in hospital room?: A Lot Help needed climbing 3-5 steps with a railing? : Total 6 Click Score: 15    End of Session   Activity Tolerance: Patient tolerated treatment well;Patient limited by fatigue Patient left: in bed;with call bell/phone within reach Nurse Communication: Other (comment)       Time: 5396-7289 PT Time Calculation (min) (ACUTE ONLY): 23 min  Charges:  $Therapeutic Activity: 23-37 mins                    G Codes:      [name], PTA 10/11/17, 9:44 AM

## 2017-10-11 NOTE — Op Note (Signed)
OPERATIVE NOTE   PROCEDURE: 1.  VAC wound dressing change under anesthesia left lateral ankle 2.  Incision and drainage of a new proximal medial calf abscess separate and distinct from the lateral ankle wound 3.  Dressing change with iodoform gauze right medial thigh abscess  PRE-OPERATIVE DIAGNOSIS: Cellulitis left lower extremity; abscess left lateral ankle; abscess proximal medial calf; abscess right proximal medial thigh  POST-OPERATIVE DIAGNOSIS: Same  SURGEON: [name]  ASSISTANT(S): None  ANESTHESIA: general  ESTIMATED BLOOD LOSS: 300 cc  FINDING(S): New abscess measuring 10 cm x 7 cm x 3 cm in depth; lateral ankle granulating nicely appears healthy no debridement required right medial thigh abscess smaller no significant drainage  SPECIMEN(S): Culture from the left proximal calf abscess for micro  INDICATIONS:   [name] is a 47 y.o. male who presents with a large wound left lateral ankle.  Currently he is being treated with a VAC wound system.  The plan is for change under anesthesia given the size of the wound.   in the holding area Mr. Huss showed me a new abscess located in the proximal medial thigh on the left.  We will plan for incision and drainage of this wound as well.  I will also plan to change his right thigh..  DESCRIPTION: After full informed written consent was obtained from the patient, the patient was brought back to the operating room and placed supine upon the operating table.  Prior to induction, the patient received IV antibiotics.   After obtaining adequate anesthesia, the patient was then prepped and draped in the standard fashion for a incision and drainage of an abscess as well as a VAC dressing change left leg.  The VAC dressing is removed and the sponge is taken out without difficulty.  The bed of the wound is rigidly granulated in the walls appear granulated as well.  No evidence of further necrotic tissue no evidence of  further pus.  The undermining appears to be significantly decreased compared to the last dressing change.  Medium sponge is then cut to shape and a VAC dressing is once again replaced.  The leg was then rotated and the draining area of the proximal medial calf is exposed ChloraPrep is utilized to prep the area.  A linear incision is created with a 15 blade.  Debridement is then performed using a 15 blade scalpel as well as Metzenbaum scissors.  Tissues are passed off the field as specimen.  Subcutaneous tissues and skin are debrided.  Fascia appears intact.  Probing of the wound reveals that it is a rather large cavity with yellow purulent material.  Culture is obtained.  An elliptical incision in the skin is then fashioned to better expose this area.  Bleeding is controlled with 3-0 Vicryl figure-of-eight sutures as well as Bovie cautery.  The wound is measured and is 10 x 7 x 3 cm.  It is irrigated and then packed with Kerlix.  ABDs were placed on top.  Left leg was then wrapped with an Ace wrap from the forefoot to the knee.  Attention was then turned to the right medial thigh where the dressing is removed the iodoform gauze is removed and a 3 inch piece of iodoform gauze is replaced followed by a 4 x 4 gauze.   The patient tolerated this procedure well.   COMPLICATIONS: None  CONDITION: [name]  Office: [phone]   10/11/2017, 6:18 PM

## 2017-10-12 ENCOUNTER — Encounter (INDEPENDENT_AMBULATORY_CARE_PROVIDER_SITE_OTHER): Payer: Self-pay | Admitting: Vascular Surgery

## 2017-10-12 ENCOUNTER — Encounter: Payer: Self-pay | Admitting: Vascular Surgery

## 2017-10-12 LAB — CBC
HCT: 27.4 % — ABNORMAL LOW (ref 40.0–52.0)
Hemoglobin: 8.9 g/dL — ABNORMAL LOW (ref 13.0–18.0)
MCH: 28.9 pg (ref 26.0–34.0)
MCHC: 32.3 g/dL (ref 32.0–36.0)
MCV: 89.5 fL (ref 80.0–100.0)
PLATELETS: 438 10*3/uL (ref 150–440)
RBC: 3.06 MIL/uL — AB (ref 4.40–5.90)
RDW: 14.5 % (ref 11.5–14.5)
WBC: 6.9 10*3/uL (ref 3.8–10.6)

## 2017-10-12 LAB — POTASSIUM: Potassium: 4.6 mmol/L (ref 3.5–5.1)

## 2017-10-12 LAB — AEROBIC/ANAEROBIC CULTURE W GRAM STAIN (SURGICAL/DEEP WOUND)

## 2017-10-12 LAB — BASIC METABOLIC PANEL
Anion gap: 8 (ref 5–15)
BUN: 37 mg/dL — ABNORMAL HIGH (ref 6–20)
CHLORIDE: 109 mmol/L (ref 101–111)
CO2: 19 mmol/L — ABNORMAL LOW (ref 22–32)
CREATININE: 2.71 mg/dL — AB (ref 0.61–1.24)
Calcium: 8.4 mg/dL — ABNORMAL LOW (ref 8.9–10.3)
GFR calc non Af Amer: 26 mL/min — ABNORMAL LOW (ref >60)
GFR, EST AFRICAN AMERICAN: 30 mL/min — AB (ref >60)
Glucose, Bld: 240 mg/dL — ABNORMAL HIGH (ref 65–99)
Potassium: 5.7 mmol/L — ABNORMAL HIGH (ref 3.5–5.1)
SODIUM: 136 mmol/L (ref 135–145)

## 2017-10-12 LAB — AEROBIC/ANAEROBIC CULTURE (SURGICAL/DEEP WOUND)

## 2017-10-12 LAB — GLUCOSE, CAPILLARY
GLUCOSE-CAPILLARY: 200 mg/dL — AB (ref 65–99)
GLUCOSE-CAPILLARY: 226 mg/dL — AB (ref 65–99)
GLUCOSE-CAPILLARY: 168 mg/dL — AB (ref 65–99)
GLUCOSE-CAPILLARY: 160 mg/dL — AB (ref 65–99)
Glucose-Capillary: 181 mg/dL — ABNORMAL HIGH (ref 65–99)

## 2017-10-12 MED ORDER — SODIUM POLYSTYRENE SULFONATE PO POWD
30.0000 g | Freq: Once | ORAL | Status: DC
  Filled 2017-10-12: qty 30

## 2017-10-12 MED ORDER — SODIUM POLYSTYRENE SULFONATE 15 GM/60ML PO SUSP
30.0000 g | Freq: Once | ORAL | Status: AC
  Administered 2017-10-12: 30 g via ORAL
  Filled 2017-10-12: qty 120

## 2017-10-12 NOTE — Progress Notes (Signed)
[place] at [place] NAME: [name]    MR#:  [id number]  DATE OF BIRTH:  [birth year]  SUBJECTIVE:  Patient had wound VAC change in the OR by Dr. Delana Meyer 10/11/17 Resting comfortably today REVIEW OF SYSTEMS:   Review of Systems  Constitutional: Negative for chills, fever and weight loss.  HENT: Negative for ear discharge, ear pain and nosebleeds.   Eyes: Negative for blurred vision, pain and discharge.  Respiratory: Negative for sputum production, shortness of breath, wheezing and stridor.   Cardiovascular: Negative for chest pain, palpitations, orthopnea and PND.  Gastrointestinal: Negative for abdominal pain, diarrhea, nausea and vomiting.  Genitourinary: Negative for frequency and urgency.  Musculoskeletal: Negative for back pain and joint pain.  Neurological: Positive for weakness. Negative for sensory change, speech change and focal weakness.  Psychiatric/Behavioral: Negative for depression and hallucinations. The patient is not nervous/anxious.    Tolerating Diet:yes Tolerating PT: Rehab  DRUG ALLERGIES:   Allergies  Allergen Reactions  . Lisinopril Swelling    VITALS:  Blood pressure 130/66, pulse 71, temperature 98.6 F (37 C), temperature source Oral, resp. rate 18, height 6\' 4"  (1.93 m), weight (!) 190.2 kg (419 lb 5 oz), SpO2 94 %.  PHYSICAL EXAMINATION:   Physical Exam  GENERAL:  47 y.o.-year-old patient lying in the bed with no acute distress.  EYES: Pupils equal, round, reactive to light and accommodation. No scleral icterus. Extraocular muscles intact.  HEENT: Head atraumatic, normocephalic. Oropharynx and nasopharynx clear.  NECK:  Supple, no jugular venous distention. No thyroid enlargement, no tenderness.  LUNGS: Normal breath sounds bilaterally, no wheezing, rales, rhonchi. No use of accessory muscles of respiration.  CARDIOVASCULAR: S1, S2 normal. No murmurs, rubs, or gallops.  ABDOMEN: Soft,  nontender, nondistended. Bowel sounds present. No organomegaly or mass.  EXTREMITIES: Left leg edema with wound vac+ NEUROLOGIC: Cranial nerves II through XII are intact. No focal Motor or sensory deficits b/l.   PSYCHIATRIC:  patient is alert and oriented x 3.  SKIN: No obvious rash, lesion, or ulcer.   LABORATORY PANEL:  CBC Recent Labs  Lab 10/12/17 0412  WBC 6.9  HGB 8.9*  HCT 27.4*  PLT 438    Chemistries  Recent Labs  Lab 10/06/17 0557  10/12/17 0412 10/12/17 1707  NA 139   < > 136  --   K 4.1   < > 5.7* 4.6  CL 108   < > 109  --   CO2 23   < > 19*  --   GLUCOSE 131*   < > 240*  --   BUN 46*   < > 37*  --   CREATININE 2.71*   < > 2.71*  --   CALCIUM 8.5*   < > 8.4*  --   MG 2.0  --   --   --    < > = values in this interval not displayed.   Cardiac Enzymes No results for input(s): TROPONINI in the last 168 hours. RADIOLOGY:  No results found. ASSESSMENT AND PLAN:  [name] is a 47 y.o. male has a past medical history significant for HTN and DM recently seen by [name] for LLE edema now with worsening pain and swelling of LLE with bloody discharge. Also c/o worsening hyperglycemia and nausea. No fever. Denies CP or SOB.   1. Left lower extremity cellulitis/necrotic area with severe chronic venous stasis dermatitis and venous ulceration. -Appreciate Dr. Nino Parsley input -On empirically  IV Zosyn and vanc -Blood cultures negative.  White count normal -s/p I and d and debridement of left leg abscess/necrotic area  Wound vac + -Wound VAC change per Dr. Delana Meyer on 10/07/2017--- per vascular patient will need couple more wound VAC changes in the OR given the extent and severity of the wound.  Might consider changing wound VAC dressing again on Friday Follow-up wound cultures that were sent on 10/07/2017-Staphylococcus aureus pansensitive  2.Uncontrolled type 2 diabetes hemoglobin A1c 15.1- -patient is n.p.o. and hypoglycemic adding D5 normal saline to the  regimen -insulin Lantus 17 unitstwice a day is on hold and continue meal coverage with insulin aspart after procedure and continue sliding scale -DC metformin given elevated creatinine  3.Hypertension  -continue home meds  4.  Acute on chronic CKD stage III with proteinuria secondary to diabetic nephropathy -Avoid nephrotoxins Patient's baseline creatinine is 1.6 -Creatinine 2.22--- fluids--- 2.10--2.32--2.8-2.70- 2.5-2.71 this may be patient's new baseline but continue to monitor closely -Nephrology input appreciated  5.  DVT prophylaxis subcu heparin  PT recommends rehab. Pt will benefit given wound vac and need for changes   Case discussed with Care Management/Social Worker. Management plans discussed with the patient, family and they are in agreement.  CODE STATUS: Full  DVT Prophylaxis: Heparin  TOTAL TIME TAKING CARE OF THIS PATIENT: 26minutes.  >50% time spent on counselling and coordination of care  POSSIBLE D/C unclear DAYS, DEPENDING ON CLINICAL CONDITION number of wound VAC changes in the OR needed.  Note: This dictation was prepared with Dragon dictation along with smaller phrase technology. Any transcriptional errors that result from this process are unintentional.  Nicholes Mango M.D on 10/12/2017 at 5:51 PM  Between 7am to 6pm - Pager - [phone]  After 6pm go to [email or web address] - password [email or web address] [facility]  Office  [phone]  CC: Primary care physician; Sallee Lange, NPPatient ID: [name], male   DOB: 07/12/1971, 47 y.o.   MRN: [id number]

## 2017-10-13 LAB — BASIC METABOLIC PANEL
Anion gap: 5 (ref 5–15)
BUN: 42 mg/dL — AB (ref 6–20)
CO2: 23 mmol/L (ref 22–32)
Calcium: 8.5 mg/dL — ABNORMAL LOW (ref 8.9–10.3)
Chloride: 111 mmol/L (ref 101–111)
Creatinine, Ser: 2.81 mg/dL — ABNORMAL HIGH (ref 0.61–1.24)
GFR calc Af Amer: 29 mL/min — ABNORMAL LOW (ref >60)
GFR, EST NON AFRICAN AMERICAN: 25 mL/min — AB (ref >60)
GLUCOSE: 194 mg/dL — AB (ref 65–99)
POTASSIUM: 4.5 mmol/L (ref 3.5–5.1)
Sodium: 139 mmol/L (ref 135–145)

## 2017-10-13 LAB — GLUCOSE, CAPILLARY
GLUCOSE-CAPILLARY: 121 mg/dL — AB (ref 65–99)
GLUCOSE-CAPILLARY: 129 mg/dL — AB (ref 65–99)
Glucose-Capillary: 139 mg/dL — ABNORMAL HIGH (ref 65–99)
Glucose-Capillary: 152 mg/dL — ABNORMAL HIGH (ref 65–99)

## 2017-10-13 LAB — SURGICAL PATHOLOGY

## 2017-10-13 MED ORDER — SODIUM CHLORIDE 0.9 % IV SOLN
3.0000 g | Freq: Four times a day (QID) | INTRAVENOUS | Status: DC
  Administered 2017-10-13 – 2017-10-14 (×3): 3 g via INTRAVENOUS
  Filled 2017-10-13 (×8): qty 3

## 2017-10-13 MED ORDER — SODIUM CHLORIDE 0.9 % IV SOLN
INTRAVENOUS | Status: DC
  Administered 2017-10-14 (×3): via INTRAVENOUS

## 2017-10-13 NOTE — Progress Notes (Signed)
[place] at [place] NAME: [name]    MR#:  [id number]  DATE OF BIRTH:  [birth year]  SUBJECTIVE:   Currently has no complaints  REVIEW OF SYSTEMS:   Review of Systems  Constitutional: Negative for chills, fever and weight loss.  HENT: Negative for ear discharge, ear pain and nosebleeds.   Eyes: Negative for blurred vision, pain and discharge.  Respiratory: Negative for sputum production, shortness of breath, wheezing and stridor.   Cardiovascular: Negative for chest pain, palpitations, orthopnea and PND.  Gastrointestinal: Negative for abdominal pain, diarrhea, nausea and vomiting.  Genitourinary: Negative for frequency and urgency.  Musculoskeletal: Negative for back pain and joint pain.  Neurological: Positive for weakness. Negative for sensory change, speech change and focal weakness.  Psychiatric/Behavioral: Negative for depression and hallucinations. The patient is not nervous/anxious.    Tolerating Diet:yes Tolerating PT: Rehab  DRUG ALLERGIES:   Allergies  Allergen Reactions  . Lisinopril Swelling    VITALS:  Blood pressure (!) 152/72, pulse 73, temperature 99.7 F (37.6 C), temperature source Oral, resp. rate 16, height 6\' 4"  (1.93 m), weight (!) 421 lb 11.8 oz (191.3 kg), SpO2 92 %.  PHYSICAL EXAMINATION:   Physical Exam  GENERAL:  47 y.o.-year-old patient lying in the bed with no acute distress.  EYES: Pupils equal, round, reactive to light and accommodation. No scleral icterus. Extraocular muscles intact.  HEENT: Head atraumatic, normocephalic. Oropharynx and nasopharynx clear.  NECK:  Supple, no jugular venous distention. No thyroid enlargement, no tenderness.  LUNGS: Normal breath sounds bilaterally, no wheezing, rales, rhonchi. No use of accessory muscles of respiration.  CARDIOVASCULAR: S1, S2 normal. No murmurs, rubs, or gallops.  ABDOMEN: Soft, nontender, nondistended. Bowel sounds present. No  organomegaly or mass.  EXTREMITIES: Left leg edema with wound vac+ NEUROLOGIC: Cranial nerves II through XII are intact. No focal Motor or sensory deficits b/l.   PSYCHIATRIC:  patient is alert and oriented x 3.  SKIN: No obvious rash, lesion, or ulcer.   LABORATORY PANEL:  CBC Recent Labs  Lab 10/12/17 0412  WBC 6.9  HGB 8.9*  HCT 27.4*  PLT 438    Chemistries  Recent Labs  Lab 10/13/17 0426  NA 139  K 4.5  CL 111  CO2 23  GLUCOSE 194*  BUN 42*  CREATININE 2.81*  CALCIUM 8.5*   Cardiac Enzymes No results for input(s): TROPONINI in the last 168 hours. RADIOLOGY:  No results found. ASSESSMENT AND PLAN:  [name] is a 47 y.o. male has a past medical history significant for HTN and DM recently seen by [name] for LLE edema now with worsening pain and swelling of LLE with bloody discharge. Also c/o worsening hyperglycemia and nausea. No fever. Denies CP or SOB.   1. Left lower extremity cellulitis/necrotic area with severe chronic venous stasis dermatitis and venous ulceration. -Appreciate Dr. Nino Parsley input -On empirically IV Zosyn and vanc -Blood cultures negative.  White count normal -s/p I and d and debridement of left leg abscess/necrotic area  Wound vac + -Wound VAC change per Dr. Delana Meyer on 10/07/2017--- per vascular patient will need couple more wound VAC changes in the OR given the extent and severity of the wound.  Might consider changing wound VAC dressing again on Friday ID consult   2.Uncontrolled type 2 diabetes hemoglobin A1c 15.1- -patient is n.p.o. and hypoglycemic adding D5 normal saline to the regimen -insulin Lantus 17 unitstwice a day is on hold and  continue meal coverage with insulin aspart after procedure and continue sliding scale -DC metformin given elevated creatinine  3.Hypertension  -continue home meds  4.  Acute on chronic CKD stage III with proteinuria secondary to diabetic nephropathy -Avoid nephrotoxins Patient's baseline  creatinine is 1.6 -Creatinine 2.22--- fluids--- 2.10--2.32--2.8-2.70- 2.5-2.71 this may be patient's new baseline but continue to monitor closely -Nephrology input appreciated  5.  DVT prophylaxis subcu heparin  PT recommends rehab. Pt will benefit given wound vac and need for changes   Case discussed with Care Management/Social Worker. Management plans discussed with the patient, family and they are in agreement.  CODE STATUS: Full  DVT Prophylaxis: Heparin  TOTAL TIME TAKING CARE OF THIS PATIENT: 63minutes.  >50% time spent on counselling and coordination of care  POSSIBLE D/C unclear DAYS, DEPENDING ON CLINICAL CONDITION number of wound VAC changes in the OR needed.  Note: This dictation was prepared with Dragon dictation along with smaller phrase technology. Any transcriptional errors that result from this process are unintentional.  Dustin Flock M.D on 10/13/2017 at 6:22 PM  Between 7am to 6pm - Pager - [phone]  After 6pm go to [email or web address] - password [email or web address] [facility]  Office  [phone]  CC: Primary care physician; Sallee Lange, NPPatient ID: [name], male   DOB: [birth year], 47 y.o.   MRN: [id number]

## 2017-10-13 NOTE — Progress Notes (Signed)
[facility]  Daily Progress Note   Subjective  - 2 Days Post-Op  Denies pain in the legs  Objective Vitals:   10/12/17 1937 10/13/17 0012 10/13/17 0443 10/13/17 0740  BP: (!) 141/70 (!) 127/54 (!) 141/72 134/68  Pulse: 74 77 68 69  Resp: 14 16  16   Temp: 98.7 F (37.1 C) 98.8 F (37.1 C) 98.7 F (37.1 C) 98.6 F (37 C)  TempSrc: Oral Oral Oral Oral  SpO2: 96% 91% 95% 95%  Weight:   (!) 421 lb 11.8 oz (191.3 kg)   Height:        Intake/Output Summary (Last 24 hours) at 10/13/2017 1650 Last data filed at 10/13/2017 1320 Gross per 24 hour  Intake 480 ml  Output 1380 ml  Net -900 ml    PULM  Normal effort , no use of accessory muscles CV  No JVD, RRR Abd      No distended, nontender VASC  Vac dressing intact Laboratory CBC    Component Value Date/Time   WBC 6.9 10/12/2017 0412   HGB 8.9 (L) 10/12/2017 0412   HCT 27.4 (L) 10/12/2017 0412   PLT 438 10/12/2017 0412    BMET    Component Value Date/Time   NA 139 10/13/2017 0426   K 4.5 10/13/2017 0426   CL 111 10/13/2017 0426   CO2 23 10/13/2017 0426   GLUCOSE 194 (H) 10/13/2017 0426   BUN 42 (H) 10/13/2017 0426   CREATININE 2.81 (H) 10/13/2017 0426   CALCIUM 8.5 (L) 10/13/2017 0426   GFRNONAA 25 (L) 10/13/2017 0426   GFRAA 29 (L) 10/13/2017 0426    Assessment/Planning:  Will plan for wound change and debridement as needed in [place]  10/13/2017, 4:50 PM

## 2017-10-13 NOTE — Progress Notes (Signed)
Pharmacy Antibiotic Note  [name] is a 47 y.o. male admitted on 10/01/2017 with LLE swelling and drainage s/p I and D.  Pharmacy has been consulted for Unasyn dosing transition from Zosyn per ID.   MSSA recovered from 2/8 wound cx and 2/12 cx growing Staph.  Plan: Although patient is in acute renal failure, SCr has not changed much since admission. It is hard to determine what GFR is in this setting. Because of the severity of infection, will begin Unasyn 3 g iv q 6 hours.   Height: 6\' 4"  (193 cm) Weight: (!) 421 lb 11.8 oz (191.3 kg) IBW/kg (Calculated) : 86.8  Temp (24hrs), Avg:98.7 F (37.1 C), Min:98.6 F (37 C), Max:98.8 F (37.1 C)  Recent Labs  Lab 10/09/17 0334 10/10/17 0313 10/11/17 0343 10/12/17 0412 10/13/17 0426  WBC  --  7.3  --  6.9  --   CREATININE 2.70* 2.50* 2.54* 2.71* 2.81*  VANCORANDOM 17  --  14  --   --     Estimated Creatinine Clearance: 58.5 mL/min (A) (by C-G formula based on SCr of 2.81 mg/dL (H)).    Allergies  Allergen Reactions  . Lisinopril Swelling    Antimicrobials this admission: vancomycin 2/2 >> 2/12 Zosyn 2/2 >> 2/14 Unasyn 2/14 >>  Dose adjustments this admission:   Microbiology results: 2/2 BCx: NG 2/4 MRSA PCR: negative 2/12 Wound Cx: Staph aureus 2/8 Wound Cx: MSSA  Thank you for allowing pharmacy to be a part of this patient's care.  Ulice Dash D 10/13/2017 3:07 PM

## 2017-10-13 NOTE — Consult Note (Signed)
[facility]     Reason for Consult:MSSA infection    Referring Physician: Michae Kava Date of Admission:  10/01/2017   Principal Problem:   Cellulitis Active Problems:   Diabetes mellitus without complication ([place])   Swelling of limb   PVD (peripheral vascular disease) (HCC)   HPI: [name] is a 47 y.o. male with DM, morbid obesity (421 #) chronic venous insufficiency admitted 2/2 with LLE swelling and pain and drainage. On admit wbc 17, no fevers. Had I and D 2/4 with debridement of large area of skin and muscle and soft tissue.  Has had serial wound vac changes but has developed separate site of infection on L thigh and on R upper inner thigh.  [place] neg.  He has no fever, wbc is down.  Initially on vanco and zosyn, now just zosyn.   Past Medical History:  Diagnosis Date  . Diabetes mellitus without complication ([place])   . Hypertension   . Microalbuminuria   . Vitamin D deficiency    Past Surgical History:  Procedure Laterality Date  . APPLICATION OF WOUND VAC Left 10/03/2017   Procedure: APPLICATION OF WOUND VAC;  Surgeon: Algernon Huxley, MD;  Location: [facility] ORS;  Service: General;  Laterality: Left;  . APPLICATION OF WOUND VAC Left 10/07/2017   Procedure: APPLICATION OF WOUND VAC;  Surgeon: Katha Cabal, MD;  Location: [facility] OR[facility];  Service: Vascular;  Laterality: Left;  . APPLICATION OF WOUND VAC Left 10/11/2017   Procedure: APPLICATION OF WOUND VAC;  Surgeon: Katha Cabal, MD;  Location: [facility] OR[facility];  Service: Vascular;  Laterality: Left;  . I&D EXTREMITY Left 10/11/2017   Procedure: IRRIGATION AND DEBRIDEMENT EXTREMITY;  Surgeon: Katha Cabal, MD;  Location: [facility] OR[facility];  Service: Vascular;  Laterality: Left;  . INCISION AND DRAINAGE ABSCESS Right 10/07/2017   Procedure: INCISION AND DRAINAGE ABSCESS;  Surgeon: Katha Cabal, MD;  Location: [facility] OR[facility];  Service: Vascular;  Laterality: Right;  . IRRIGATION AND DEBRIDEMENT ABSCESS Left 10/03/2017   Procedure: IRRIGATION AND DEBRIDEMENT ABSCESS with debridement of skin, soft tissue, muscle 50sq cm;  Surgeon: Algernon Huxley, MD;  Location: [facility] OR[facility];  Service: General;  Laterality: Left;   Social History   Tobacco Use  . Smoking status: Never Smoker  . Smokeless tobacco: Never Used  Substance Use Topics  . Alcohol use: Yes    Comment: beer  . Drug use: Not on file   Family History  Problem Relation Age of Onset  . Diabetes Father   . Kidney disease Father   . Diabetes Daughter     Allergies:  Allergies  Allergen Reactions  . Lisinopril Swelling    Current antibiotics: Antibiotics Given (last 72 hours)    Date/Time Action Medication Dose Rate   10/10/17 1459 New Bag/Given   piperacillin-tazobactam (ZOSYN) IVPB 3.375 g 3.375 g 12.5 mL/hr   10/10/17 2352 New Bag/Given  [pt wanted to wait to hang med until wife came]   piperacillin-tazobactam (ZOSYN) IVPB 3.375 g 3.375 g 12.5 mL/hr   10/11/17 0603 New Bag/Given   piperacillin-tazobactam (ZOSYN) IVPB 3.375 g 3.375 g 12.5 mL/hr   10/11/17 0603 New Bag/Given   vancomycin (VANCOCIN) 1,500 mg in sodium chloride 0.9 % 500 mL IVPB 1,500 mg 250 mL/hr   10/11/17 1320 New Bag/Given   piperacillin-tazobactam (ZOSYN) IVPB 3.375 g 3.375 g 12.5 mL/hr   10/11/17 2246 New Bag/Given   piperacillin-tazobactam (ZOSYN) IVPB 3.375 g 3.375 g 12.5 mL/hr   10/12/17  0630 New Bag/Given   piperacillin-tazobactam (ZOSYN) IVPB 3.375 g 3.375 g 12.5 mL/hr   10/12/17 1437 New Bag/Given   piperacillin-tazobactam (ZOSYN) IVPB 3.375 g 3.375 g 12.5 mL/hr   10/12/17 2324 New Bag/Given   piperacillin-tazobactam (ZOSYN) IVPB 3.375 g 3.375 g 12.5 mL/hr   10/13/17 1350 New Bag/Given   piperacillin-tazobactam (ZOSYN) IVPB 3.375 g 3.375 g 12.5 mL/hr      MEDICATIONS: . amLODipine  10 mg Oral Daily  . atenolol  25 mg Oral Daily  . docusate sodium  100 mg Oral BID  . heparin injection (subcutaneous)  5,000 Units Subcutaneous Q8H  . Influenza vac split  quadrivalent PF  0.5 mL Intramuscular Tomorrow-1000  . insulin aspart  0-15 Units Subcutaneous TID WC  . insulin aspart  2 Units Subcutaneous TID WC  . insulin glargine  17 Units Subcutaneous BID  . multivitamin with minerals  1 tablet Oral Daily  . pneumococcal 23 valent vaccine  0.5 mL Intramuscular Tomorrow-1000  . protein supplement shake  11 oz Oral BID BM  . vitamin C  500 mg Oral BID    Review of Systems - 11 systems reviewed and negative per HPI   OBJECTIVE: Temp:  [98.6 F (37 C)-98.8 F (37.1 C)] 98.6 F (37 C) (02/14 0740) Pulse Rate:  [68-77] 69 (02/14 0740) Resp:  [14-18] 16 (02/14 0740) BP: (127-141)/(54-72) 134/68 (02/14 0740) SpO2:  [91 %-96 %] 95 % (02/14 0740) Weight:  [191.3 kg (421 lb 11.8 oz)] 191.3 kg (421 lb 11.8 oz) (02/14 0443) Physical Exam  Constitutional: He is oriented to person, place, and time. Obese HENT: anicteric Mouth/Throat: Oropharynx is clear and moist. No oropharyngeal exudate.  Cardiovascular: Normal rate, regular rhythm and normal heart sounds.Pulmonary/Chest: Effort normal and breath sounds normal. No respiratory distress. He has no wheezes.  Abdominal: Soft. Bowel sounds are normal. He exhibits no distension. There is no tenderness.  Lymphadenopathy: He has no cervical adenopathy.  Neurological: He is alert and oriented to person, place, and time.  Ext 2 + edema Skin: R upper inner thigh with 1 cm incision site packed with dressing. No drainage note. L leg on wound vac Psychiatric: He has a normal mood and affect. His behavior is normal.     LABS: Results for orders placed or performed during the hospital encounter of 10/01/17 (from the past 48 hour(s))  Glucose, capillary     Status: None   Collection Time: 10/11/17  4:16 PM  Result Value Ref Range   Glucose-Capillary 83 65 - 99 mg/dL  Aerobic/Anaerobic Culture (surgical/deep wound)     Status: None (Preliminary result)   Collection Time: 10/11/17  6:28 PM  Result Value Ref  Range   Specimen Description      LEG LEFT Performed at [facility], [address], [place], [place] [address]    Special Requests      NONE Performed at [facility], [place], [place] [address]    Gram Stain      FEW WBC PRESENT, PREDOMINANTLY PMN FEW [place] Performed at [facility], [place] [address] [place], [place] [address]    Culture PENDING    Report Status PENDING   Glucose, capillary     Status: None   Collection Time: 10/11/17  6:37 PM  Result Value Ref Range   Glucose-Capillary 81 65 - 99 mg/dL  Glucose, capillary     Status: Abnormal   Collection Time: 10/11/17  9:29 PM  Result  Value Ref Range   Glucose-Capillary 179 (H) 65 - 99 mg/dL   Comment 1 Notify RN   Basic metabolic panel     Status: Abnormal   Collection Time: 10/12/17  4:12 AM  Result Value Ref Range   Sodium 136 135 - 145 mmol/L   Potassium 5.7 (H) 3.5 - 5.1 mmol/L   Chloride 109 101 - 111 mmol/L   CO2 19 (L) 22 - 32 mmol/L   Glucose, Bld 240 (H) 65 - 99 mg/dL   BUN 37 (H) 6 - 20 mg/dL   Creatinine, Ser 2.71 (H) 0.61 - 1.24 mg/dL   Calcium 8.4 (L) 8.9 - 10.3 mg/dL   GFR calc non Af [name] 26 (L) >60 mL/min   GFR calc Af [name] 30 (L) >60 mL/min    Comment: (NOTE) The eGFR has been calculated using the CKD EPI equation. This calculation has not been validated in all clinical situations. eGFR's persistently <60 mL/min signify possible Chronic Kidney Disease.    Anion gap 8 5 - 15    Comment: Performed at [facility], [address] [place], [place] [address]  CBC     Status: Abnormal   Collection Time: 10/12/17  4:12 AM  Result Value Ref Range   WBC 6.9 3.8 - 10.6 K/uL   RBC 3.06 (L) 4.40 - 5.90 MIL/uL   Hemoglobin 8.9 (L) 13.0 - 18.0 g/dL   HCT 27.4 (L) 40.0 - 52.0 %   MCV 89.5 80.0 - 100.0 fL   MCH 28.9 26.0 - 34.0 pg   MCHC 32.3 32.0 - 36.0 g/dL   RDW 14.5 11.5 - 14.5 %   Platelets 438 150 - 440 K/uL    Comment: Performed  at [facility], [address] [place], [place] [address]  Glucose, capillary     Status: Abnormal   Collection Time: 10/12/17  7:34 AM  Result Value Ref Range   Glucose-Capillary 200 (H) 65 - 99 mg/dL  Glucose, capillary     Status: Abnormal   Collection Time: 10/12/17 11:59 AM  Result Value Ref Range   Glucose-Capillary 226 (H) 65 - 99 mg/dL  Glucose, capillary     Status: Abnormal   Collection Time: 10/12/17  4:29 PM  Result Value Ref Range   Glucose-Capillary 168 (H) 65 - 99 mg/dL  Potassium     Status: None   Collection Time: 10/12/17  5:07 PM  Result Value Ref Range   Potassium 4.6 3.5 - 5.1 mmol/L    Comment: Performed at [facility], [address] [place], [place] [address]  Glucose, capillary     Status: Abnormal   Collection Time: 10/12/17  9:04 PM  Result Value Ref Range   Glucose-Capillary 160 (H) 65 - 99 mg/dL  Glucose, capillary     Status: Abnormal   Collection Time: 10/12/17 11:33 PM  Result Value Ref Range   Glucose-Capillary 181 (H) 65 - 99 mg/dL  Basic metabolic panel     Status: Abnormal   Collection Time: 10/13/17  4:26 AM  Result Value Ref Range   Sodium 139 135 - 145 mmol/L   Potassium 4.5 3.5 - 5.1 mmol/L   Chloride 111 101 - 111 mmol/L   CO2 23 22 - 32 mmol/L   Glucose, Bld 194 (H) 65 - 99 mg/dL   BUN 42 (H) 6 - 20 mg/dL   Creatinine, Ser 2.81 (H) 0.61 - 1.24 mg/dL   Calcium 8.5 (L) 8.9 - 10.3 mg/dL  GFR calc non Af [name] 25 (L) >60 mL/min   GFR calc Af [name] 29 (L) >60 mL/min    Comment: (NOTE) The eGFR has been calculated using the CKD EPI equation. This calculation has not been validated in all clinical situations. eGFR's persistently <60 mL/min signify possible Chronic Kidney Disease.    Anion gap 5 5 - 15    Comment: Performed at [facility], [address] [place], [place] [address]  Glucose, capillary     Status: Abnormal   Collection Time: 10/13/17  7:33 AM  Result Value Ref Range    Glucose-Capillary 139 (H) 65 - 99 mg/dL   Comment 1 Notify RN   Glucose, capillary     Status: Abnormal   Collection Time: 10/13/17 12:12 PM  Result Value Ref Range   Glucose-Capillary 121 (H) 65 - 99 mg/dL   No components found for: ESR, C REACTIVE PROTEIN MICRO: Recent Results (from the past 720 hour(s))  Blood Culture (routine x 2)     Status: None   Collection Time: 10/01/17 10:41 AM  Result Value Ref Range Status   Specimen Description BLOOD LEFT ANTECUBITAL  Final   Special Requests   Final    BOTTLES DRAWN AEROBIC AND ANAEROBIC Blood Culture results may not be optimal due to an excessive volume of blood received in culture bottles   Culture   Final    NO GROWTH 5 DAYS Performed at [facility], [address] [place], [place] [address]    Report Status 10/06/2017 FINAL  Final  Blood Culture (routine x 2)     Status: None   Collection Time: 10/01/17 11:31 AM  Result Value Ref Range Status   Specimen Description BLOOD BLOOD RIGHT ARM  Final   Special Requests   Final    BOTTLES DRAWN AEROBIC AND ANAEROBIC Blood Culture adequate volume   Culture   Final    NO GROWTH 5 DAYS Performed at [facility], [address] [place], [place] [address]    Report Status 10/06/2017 FINAL  Final  MRSA PCR Screening     Status: None   Collection Time: 10/03/17  8:02 AM  Result Value Ref Range Status   MRSA by PCR NEGATIVE NEGATIVE Final    Comment:        The GeneXpert MRSA Assay (FDA approved for NASAL specimens only), is one component of a comprehensive MRSA colonization surveillance program. It is not intended to diagnose MRSA infection nor to guide or monitor treatment for MRSA infections. Performed at [facility], [address] [place], [place] [address]   Aerobic/Anaerobic Culture (surgical/deep wound)     Status: None   Collection Time: 10/07/17  8:25 AM  Result Value Ref Range Status   Specimen Description   Final    WOUND Performed  at [facility], [address], [place], [place] [address]    Special Requests   Final    PUS FROM R LEG ABSCESS Performed at [facility], [address] [place], [place] [address]    Gram Stain   Final    MODERATE WBC PRESENT,BOTH PMN AND MONONUCLEAR FEW SQUAMOUS EPITHELIAL CELLS PRESENT FEW GRAM POSITIVE COCCI IN CLUSTERS    Culture   Final    FEW STAPHYLOCOCCUS AUREUS NO ANAEROBES ISOLATED Performed at [facility], [place] [address], [place], [place] [address]    Report Status 10/12/2017 FINAL  Final   Organism ID, Bacteria STAPHYLOCOCCUS AUREUS  Final  Susceptibility   Staphylococcus aureus - MIC*    CIPROFLOXACIN <=0.5 SENSITIVE Sensitive     ERYTHROMYCIN <=0.25 SENSITIVE Sensitive     GENTAMICIN <=0.5 SENSITIVE Sensitive     OXACILLIN <=0.25 SENSITIVE Sensitive     TETRACYCLINE <=1 SENSITIVE Sensitive     VANCOMYCIN 1 SENSITIVE Sensitive     TRIMETH/SULFA <=10 SENSITIVE Sensitive     CLINDAMYCIN <=0.25 SENSITIVE Sensitive     RIFAMPIN <=0.5 SENSITIVE Sensitive     Inducible Clindamycin NEGATIVE Sensitive     * FEW STAPHYLOCOCCUS AUREUS  Aerobic/Anaerobic Culture (surgical/deep wound)     Status: None (Preliminary result)   Collection Time: 10/11/17  6:28 PM  Result Value Ref Range Status   Specimen Description   Final    LEG LEFT Performed at [facility], [address] [place], [place] [address]    Special Requests   Final    NONE Performed at [facility], [address] [place], [place] [address]    Gram Stain   Final    FEW WBC PRESENT, PREDOMINANTLY PMN FEW GRAM POSITIVE COCCI Performed at [facility], [place] [address], [place], [place] [address]    Culture PENDING  Incomplete   Report Status PENDING  Incomplete    IMAGING: US Renal  Result Date: 10/01/2017 CLINICAL DATA:  Acute kidney failure, chronic kidney disease stage 3. EXAM: RENAL / URINARY TRACT ULTRASOUND COMPLETE COMPARISON:  None.  FINDINGS: Right Kidney: Length: 10.5 cm. Echogenicity within normal limits. No mass or hydronephrosis visualized. Left Kidney: Length: 11.7 cm. Echogenicity within normal limits. No mass or hydronephrosis visualized. Bladder: Appears normal for degree of bladder distention. IMPRESSION: Normal renal ultrasound. Electronically Signed   By: Marijo Conception, M.D.   On: 10/01/2017 19:29   US Venous Img Lower Unilateral Left  Result Date: 10/01/2017 CLINICAL DATA:  Left lower extremity swelling x4 weeks EXAM: LEFT LOWER EXTREMITY VENOUS DOPPLER ULTRASOUND TECHNIQUE: Gray-scale sonography with compression, as well as color and duplex ultrasound, were performed to evaluate the deep venous system from the level of the common femoral vein through the popliteal and proximal calf veins. COMPARISON:  04/06/2004 by report only FINDINGS: Normal compressibility of the common femoral, superficial femoral, and popliteal veins, as well as the proximal calf veins. No filling defects to suggest DVT on grayscale or color Doppler imaging. Doppler waveforms show normal direction of venous flow, normal respiratory phasicity and response to augmentation. Prominent left inguinal lymph node measuring 2 cm short axis diameter, possibly reactive but nonspecific. Subcutaneous edema in the calf. Survey views of the contralateral common femoral vein are unremarkable. IMPRESSION: No evidence of LEFT lower extremity deep vein thrombosis. Electronically Signed   By: Lucrezia Europe M.D.   On: 10/01/2017 12:48    Assessment:   [name] is a 47 y.o. male with morbid obesity, poorly controlled DM (A1c 15.1) admitted with worsening LLE infection around ankle and found to have MSSA infection. He has also developed infection at a  Distant site on R thigh and also a new prox medial calf abscess on L.  He denies hx prior abscesses. His HIV negative. He has no fever and wbc down.   He may have had several abscesses brewing at the time of admission  (per his report the R thigh one was already starting at time of admit) and they have come to a head now.    Recommendations Can narrow from zosyn to unasyn Continue wound care with wound vac  per vascular If no evidence of deeper infection or bone/joint infection would plan to continue IV abx therapy during admission and then change to oral abx at dc  Thank you very much for allowing me to participate in the care of this patient. Please call with questions.   Cheral Marker. Ola Spurr, MD

## 2017-10-13 NOTE — Progress Notes (Signed)
Per RN in progression rounds this morning patient will be going back to the OR today for a wound vac change. [employer] SNF authorization is good through Monday 10/17/17. [facility] admissions coordinator at [facility] [facility] [employer] is aware of above.   [employer], LCSW [phone]

## 2017-10-13 NOTE — Progress Notes (Signed)
Follow-up with patient to discuss the outcome of his surgery. Patient is processing the future and has a positive outlook. Chaplain offered active listening and prayer.

## 2017-10-14 ENCOUNTER — Encounter: Payer: Self-pay | Admitting: Anesthesiology

## 2017-10-14 ENCOUNTER — Inpatient Hospital Stay: Payer: BLUE CROSS/BLUE SHIELD | Admitting: Certified Registered"

## 2017-10-14 ENCOUNTER — Encounter
Admission: EM | Disposition: A | Discharge status: Skilled Nursing Facility | Payer: Self-pay | Source: Home / Self Care | Attending: Internal Medicine

## 2017-10-14 DIAGNOSIS — L02416 Cutaneous abscess of left lower limb: Secondary | ICD-10-CM

## 2017-10-14 DIAGNOSIS — I872 Venous insufficiency (chronic) (peripheral): Secondary | ICD-10-CM

## 2017-10-14 DIAGNOSIS — L03116 Cellulitis of left lower limb: Secondary | ICD-10-CM

## 2017-10-14 HISTORY — PX: APPLICATION OF WOUND VAC: SHX5189

## 2017-10-14 LAB — BASIC METABOLIC PANEL
Anion gap: 6 (ref 5–15)
BUN: 42 mg/dL — AB (ref 6–20)
CHLORIDE: 110 mmol/L (ref 101–111)
CO2: 25 mmol/L (ref 22–32)
CREATININE: 2.43 mg/dL — AB (ref 0.61–1.24)
Calcium: 8.7 mg/dL — ABNORMAL LOW (ref 8.9–10.3)
GFR calc Af Amer: 35 mL/min — ABNORMAL LOW (ref >60)
GFR calc non Af Amer: 30 mL/min — ABNORMAL LOW (ref >60)
Glucose, Bld: 132 mg/dL — ABNORMAL HIGH (ref 65–99)
Potassium: 4.7 mmol/L (ref 3.5–5.1)
SODIUM: 141 mmol/L (ref 135–145)

## 2017-10-14 LAB — GLUCOSE, CAPILLARY
GLUCOSE-CAPILLARY: 108 mg/dL — AB (ref 65–99)
GLUCOSE-CAPILLARY: 149 mg/dL — AB (ref 65–99)
Glucose-Capillary: 109 mg/dL — ABNORMAL HIGH (ref 65–99)
Glucose-Capillary: 111 mg/dL — ABNORMAL HIGH (ref 65–99)
Glucose-Capillary: 103 mg/dL — ABNORMAL HIGH (ref 65–99)

## 2017-10-14 LAB — PROTIME-INR
INR: 1.05
Prothrombin Time: 13.6 seconds (ref 11.4–15.2)

## 2017-10-14 LAB — TYPE AND SCREEN
ABO/RH(D): A POS
ANTIBODY SCREEN: NEGATIVE

## 2017-10-14 LAB — CBC
HEMATOCRIT: 24 % — AB (ref 40.0–52.0)
Hemoglobin: 7.9 g/dL — ABNORMAL LOW (ref 13.0–18.0)
MCH: 29.3 pg (ref 26.0–34.0)
MCHC: 32.7 g/dL (ref 32.0–36.0)
MCV: 89.5 fL (ref 80.0–100.0)
PLATELETS: 399 10*3/uL (ref 150–440)
RBC: 2.69 MIL/uL — ABNORMAL LOW (ref 4.40–5.90)
RDW: 14.8 % — AB (ref 11.5–14.5)
WBC: 7.9 10*3/uL (ref 3.8–10.6)

## 2017-10-14 LAB — MAGNESIUM: Magnesium: 1.7 mg/dL (ref 1.7–2.4)

## 2017-10-14 LAB — APTT: APTT: 35 s (ref 24–36)

## 2017-10-14 SURGERY — APPLICATION, WOUND VAC
Anesthesia: General | Laterality: Left | Wound class: Clean Contaminated

## 2017-10-14 MED ORDER — MIDAZOLAM HCL 2 MG/2ML IJ SOLN
INTRAMUSCULAR | Status: AC
  Filled 2017-10-14: qty 2

## 2017-10-14 MED ORDER — ONDANSETRON HCL 4 MG/2ML IJ SOLN
INTRAMUSCULAR | Status: AC
  Filled 2017-10-14: qty 2

## 2017-10-14 MED ORDER — ONDANSETRON HCL 4 MG/2ML IJ SOLN
INTRAMUSCULAR | Status: DC | PRN
  Administered 2017-10-14: 4 mg via INTRAVENOUS

## 2017-10-14 MED ORDER — PROPOFOL 10 MG/ML IV BOLUS
INTRAVENOUS | Status: DC | PRN
  Administered 2017-10-14: 220 mg via INTRAVENOUS

## 2017-10-14 MED ORDER — CEFAZOLIN SODIUM-DEXTROSE 1-4 GM/50ML-% IV SOLN
1.0000 g | Freq: Three times a day (TID) | INTRAVENOUS | Status: DC
  Administered 2017-10-14 – 2017-10-19 (×14): 1 g via INTRAVENOUS
  Filled 2017-10-14 (×19): qty 50

## 2017-10-14 MED ORDER — ONDANSETRON HCL 4 MG/2ML IJ SOLN: 4.0000 mg | Freq: Once | INTRAMUSCULAR | Status: DC | PRN

## 2017-10-14 MED ORDER — SUCCINYLCHOLINE CHLORIDE 20 MG/ML IJ SOLN
INTRAMUSCULAR | Status: DC | PRN
  Administered 2017-10-14: 160 mg via INTRAVENOUS

## 2017-10-14 MED ORDER — LIDOCAINE HCL (PF) 2 % IJ SOLN
INTRAMUSCULAR | Status: AC
  Filled 2017-10-14: qty 10

## 2017-10-14 MED ORDER — FENTANYL CITRATE (PF) 100 MCG/2ML IJ SOLN
INTRAMUSCULAR | Status: AC
  Filled 2017-10-14: qty 2

## 2017-10-14 MED ORDER — MIDAZOLAM HCL 2 MG/2ML IJ SOLN
INTRAMUSCULAR | Status: DC | PRN
  Administered 2017-10-14: 2 mg via INTRAVENOUS

## 2017-10-14 MED ORDER — FENTANYL CITRATE (PF) 100 MCG/2ML IJ SOLN
25.0000 ug | INTRAMUSCULAR | Status: DC | PRN
  Administered 2017-10-14: 25 ug via INTRAVENOUS

## 2017-10-14 MED ORDER — FENTANYL CITRATE (PF) 100 MCG/2ML IJ SOLN
INTRAMUSCULAR | Status: DC | PRN
  Administered 2017-10-14: 50 ug via INTRAVENOUS

## 2017-10-14 MED ORDER — PROPOFOL 10 MG/ML IV BOLUS
INTRAVENOUS | Status: AC
  Filled 2017-10-14: qty 40

## 2017-10-14 MED ORDER — LIDOCAINE HCL (CARDIAC) 20 MG/ML IV SOLN
INTRAVENOUS | Status: DC | PRN
  Administered 2017-10-14: 100 mg via INTRAVENOUS

## 2017-10-14 SURGICAL SUPPLY — 29 items
BANDAGE ACE 6X5 VEL STRL LF (GAUZE/BANDAGES/DRESSINGS) ×3 IMPLANT
BNDG COHESIVE 6X5 TAN STRL LF (GAUZE/BANDAGES/DRESSINGS) ×3 IMPLANT
CANISTER SUCT 1200ML W/VALVE (MISCELLANEOUS) ×3 IMPLANT
CHLORAPREP W/TINT 26ML (MISCELLANEOUS) ×3 IMPLANT
DRSG EMULSION OIL 3X3 NADH (GAUZE/BANDAGES/DRESSINGS) ×3 IMPLANT
DRSG MEPITEL 4X7.2 (GAUZE/BANDAGES/DRESSINGS) ×3 IMPLANT
DRSG TEGADERM 2-3/8X2-3/4 SM (GAUZE/BANDAGES/DRESSINGS) ×3 IMPLANT
DRSG TEGADERM 4X4.75 (GAUZE/BANDAGES/DRESSINGS) ×3 IMPLANT
DRSG VAC ATS MED SENSATRAC (GAUZE/BANDAGES/DRESSINGS) ×3 IMPLANT
ELECT REM PT RETURN 9FT ADLT (ELECTROSURGICAL) ×3
ELECTRODE REM PT RTRN 9FT ADLT (ELECTROSURGICAL) ×1 IMPLANT
GAUZE IODOFORM PACK 1/2 7832 (GAUZE/BANDAGES/DRESSINGS) ×3 IMPLANT
GAUZE SPONGE 4X4 12PLY STRL (GAUZE/BANDAGES/DRESSINGS) ×3 IMPLANT
GAUZE STRETCH 2X75IN STRL (MISCELLANEOUS) IMPLANT
GLOVE SURG SYN 8.0 (GLOVE) ×3 IMPLANT
GOWN STRL REUS W/ TWL LRG LVL3 (GOWN DISPOSABLE) ×1 IMPLANT
GOWN STRL REUS W/ TWL XL LVL3 (GOWN DISPOSABLE) ×1 IMPLANT
GOWN STRL REUS W/TWL LRG LVL3 (GOWN DISPOSABLE) ×2
GOWN STRL REUS W/TWL XL LVL3 (GOWN DISPOSABLE) ×2
KIT TURNOVER KIT A (KITS) ×3 IMPLANT
LABEL OR SOLS (LABEL) ×3 IMPLANT
NS IRRIG 500ML POUR BTL (IV SOLUTION) ×3 IMPLANT
PACK EXTREMITY ARMC (MISCELLANEOUS) ×3 IMPLANT
PAD ABD DERMACEA PRESS 5X9 (GAUZE/BANDAGES/DRESSINGS) ×3 IMPLANT
PAD PREP 24X41 OB/GYN DISP (PERSONAL CARE ITEMS) ×3 IMPLANT
SOL PREP PVP 2OZ (MISCELLANEOUS) ×3
SOLUTION PREP PVP 2OZ (MISCELLANEOUS) ×1 IMPLANT
STOCKINETTE IMPERV 14X48 (MISCELLANEOUS) ×3 IMPLANT
WND VAC CANISTER 500ML (MISCELLANEOUS) ×3 IMPLANT

## 2017-10-14 NOTE — Progress Notes (Signed)
[place] INFECTIOUS DISEASE PROGRESS NOTE Date of Admission:  10/01/2017     ID: [name] is a 47 y.o. male with MSSA skin and soft tissue infections Principal Problem:   Cellulitis Active Problems:   Diabetes mellitus without complication (HCC)   Swelling of limb   PVD (peripheral vascular disease) (HCC)   Subjective: No fevers, had wound vac change today with noted improvement and decent granulation tissue per op report.   ROS  Eleven systems are reviewed and negative except per hpi  Medications:  Antibiotics Given (last 72 hours)    Date/Time Action Medication Dose Rate   10/11/17 2246 New Bag/Given   piperacillin-tazobactam (ZOSYN) IVPB 3.375 g 3.375 g 12.5 mL/hr   10/12/17 0630 New Bag/Given   piperacillin-tazobactam (ZOSYN) IVPB 3.375 g 3.375 g 12.5 mL/hr   10/12/17 1437 New Bag/Given   piperacillin-tazobactam (ZOSYN) IVPB 3.375 g 3.375 g 12.5 mL/hr   10/12/17 2324 New Bag/Given   piperacillin-tazobactam (ZOSYN) IVPB 3.375 g 3.375 g 12.5 mL/hr   10/13/17 1350 New Bag/Given   piperacillin-tazobactam (ZOSYN) IVPB 3.375 g 3.375 g 12.5 mL/hr   10/13/17 2230 New Bag/Given   Ampicillin-Sulbactam (UNASYN) 3 g in sodium chloride 0.9 % 100 mL IVPB 3 g 200 mL/hr   10/14/17 0234 New Bag/Given   Ampicillin-Sulbactam (UNASYN) 3 g in sodium chloride 0.9 % 100 mL IVPB 3 g 200 mL/hr   10/14/17 0950 New Bag/Given  [sent to OR with patient.]   Ampicillin-Sulbactam (UNASYN) 3 g in sodium chloride 0.9 % 100 mL IVPB 3 g 200 mL/hr     . amLODipine  10 mg Oral Daily  . atenolol  25 mg Oral Daily  . docusate sodium  100 mg Oral BID  . fentaNYL      . heparin injection (subcutaneous)  5,000 Units Subcutaneous Q8H  . Influenza vac split quadrivalent PF  0.5 mL Intramuscular Tomorrow-1000  . insulin aspart  0-15 Units Subcutaneous TID WC  . insulin aspart  2 Units Subcutaneous TID WC  . insulin glargine  17 Units Subcutaneous BID  . multivitamin with minerals  1 tablet Oral  Daily  . pneumococcal 23 valent vaccine  0.5 mL Intramuscular Tomorrow-1000  . protein supplement shake  11 oz Oral BID BM  . vitamin C  500 mg Oral BID    Objective: Vital signs in last 24 hours: Temp:  [96.8 F (36 C)-99.7 F (37.6 C)] 97.5 F (36.4 C) (02/15 1500) Pulse Rate:  [63-73] 63 (02/15 1500) Resp:  [0-27] 12 (02/15 1500) BP: (97-154)/(56-76) 125/62 (02/15 1500) SpO2:  [91 %-98 %] 94 % (02/15 1500) Weight:  [191 kg (421 lb)-192.9 kg (425 lb 4.3 oz)] 191 kg (421 lb) (02/15 1119) Constitutional: He is oriented to person, place, and time. Obese HENT: anicteric Mouth/Throat: Oropharynx is clear and moist. No oropharyngeal exudate.  Cardiovascular: Normal rate, regular rhythm and normal heart sounds.Pulmonary/Chest: Effort normal and breath sounds normal. No respiratory distress. He has no wheezes.  Abdominal: Soft. Bowel sounds are normal. He exhibits no distension. There is no tenderness.  Lymphadenopathy: He has no cervical adenopathy.  Neurological: He is alert and oriented to person, place, and time.  Ext 2 + edema Skin: R upper inner thigh with 1 cm incision site packed with dressing. No drainage note. L leg on wound vac Psychiatric: He has a normal mood and affect. His behavior is normal.     Lab Results Recent Labs    10/12/17 0412  10/13/17 0426 10/14/17 0434  WBC 6.9  --   --  7.9  HGB 8.9*  --   --  7.9*  HCT 27.4*  --   --  24.0*  NA 136  --  139 141  K 5.7*   < > 4.5 4.7  CL 109  --  111 110  CO2 19*  --  23 25  BUN 37*  --  42* 42*  CREATININE 2.71*  --  2.81* 2.43*   < > = values in this interval not displayed.    Microbiology: Results for orders placed or performed during the hospital encounter of 10/01/17  Blood Culture (routine x 2)     Status: None   Collection Time: 10/01/17 10:41 AM  Result Value Ref Range Status   Specimen Description BLOOD LEFT ANTECUBITAL  Final   Special Requests   Final    BOTTLES DRAWN AEROBIC AND ANAEROBIC  Blood Culture results may not be optimal due to an excessive volume of blood received in culture bottles   Culture   Final    NO GROWTH 5 DAYS Performed at [facility], [address] [place], [place] [address]    Report Status 10/06/2017 FINAL  Final  Blood Culture (routine x 2)     Status: None   Collection Time: 10/01/17 11:31 AM  Result Value Ref Range Status   Specimen Description BLOOD BLOOD RIGHT ARM  Final   Special Requests   Final    BOTTLES DRAWN AEROBIC AND ANAEROBIC Blood Culture adequate volume   Culture   Final    NO GROWTH 5 DAYS Performed at [facility], [address] [place], [place] [address]    Report Status 10/06/2017 FINAL  Final  MRSA PCR Screening     Status: None   Collection Time: 10/03/17  8:02 AM  Result Value Ref Range Status   MRSA by PCR NEGATIVE NEGATIVE Final    Comment:        The GeneXpert MRSA Assay (FDA approved for NASAL specimens only), is one component of a comprehensive MRSA colonization surveillance program. It is not intended to diagnose MRSA infection nor to guide or monitor treatment for MRSA infections. Performed at [facility], [address], [place], [place] [address]   Aerobic/Anaerobic Culture (surgical/deep wound)     Status: None   Collection Time: 10/07/17  8:25 AM  Result Value Ref Range Status   Specimen Description   Final    WOUND Performed at [facility], [address] [place], [place] [address]    Special Requests   Final    PUS FROM R LEG ABSCESS Performed at [facility], [address] [place], [place] [address]    Gram Stain   Final    MODERATE WBC PRESENT,BOTH PMN AND MONONUCLEAR FEW SQUAMOUS EPITHELIAL CELLS PRESENT FEW GRAM POSITIVE COCCI IN CLUSTERS    Culture   Final    FEW STAPHYLOCOCCUS AUREUS NO ANAEROBES ISOLATED Performed at [facility], [place] [address], [place], [place] [address]    Report Status 10/12/2017 FINAL  Final    Organism ID, Bacteria STAPHYLOCOCCUS AUREUS  Final      Susceptibility   Staphylococcus aureus - MIC*    CIPROFLOXACIN <=0.5 SENSITIVE Sensitive     ERYTHROMYCIN <=0.25 SENSITIVE Sensitive     GENTAMICIN <=0.5 SENSITIVE Sensitive     OXACILLIN <=0.25 SENSITIVE Sensitive     TETRACYCLINE <=1 SENSITIVE Sensitive     VANCOMYCIN 1 SENSITIVE Sensitive  TRIMETH/SULFA <=10 SENSITIVE Sensitive     CLINDAMYCIN <=0.25 SENSITIVE Sensitive     RIFAMPIN <=0.5 SENSITIVE Sensitive     Inducible Clindamycin NEGATIVE Sensitive     * FEW STAPHYLOCOCCUS AUREUS  Aerobic/Anaerobic Culture (surgical/deep wound)     Status: None (Preliminary result)   Collection Time: 10/11/17  6:28 PM  Result Value Ref Range Status   Specimen Description   Final    LEG LEFT Performed at [facility], [address], [place], [place] [address]    Special Requests   Final    NONE Performed at [facility], [address], [place], [place] [address]    Gram Stain   Final    FEW WBC PRESENT, PREDOMINANTLY PMN FEW GRAM POSITIVE COCCI Performed at [facility], [place] [address], [place], [place] [address]    Culture   Final    FEW STAPHYLOCOCCUS AUREUS NO ANAEROBES ISOLATED; CULTURE IN PROGRESS FOR 5 DAYS    Report Status PENDING  Incomplete   Organism ID, Bacteria STAPHYLOCOCCUS AUREUS  Final      Susceptibility   Staphylococcus aureus - MIC*    CIPROFLOXACIN <=0.5 SENSITIVE Sensitive     ERYTHROMYCIN <=0.25 SENSITIVE Sensitive     GENTAMICIN <=0.5 SENSITIVE Sensitive     OXACILLIN <=0.25 SENSITIVE Sensitive     TETRACYCLINE <=1 SENSITIVE Sensitive     VANCOMYCIN <=0.5 SENSITIVE Sensitive     TRIMETH/SULFA <=10 SENSITIVE Sensitive     CLINDAMYCIN <=0.25 SENSITIVE Sensitive     RIFAMPIN <=0.5 SENSITIVE Sensitive     Inducible Clindamycin NEGATIVE Sensitive     * FEW STAPHYLOCOCCUS AUREUS    Studies/Results: No results found.  Assessment/Plan: [name] is a 47 y.o. male  with morbid obesity, poorly controlled DM (A1c 15.1) admitted with worsening LLE infection around ankle and found to have MSSA infection. He has also developed infection at a distant site on R thigh and also a new prox medial calf abscess on L.  He denies hx prior abscesses. His HIV negative. He has no fever and wbc down.   He may have had several abscesses brewing at the time of admission (per his report the R thigh one was already starting at time of admit) and they have come to a head now.    2/15- s/p wound vac change with improvement per report. All cxs with MSSA.  Recommendations Can further narrow from  unasyn to ancef IV Continue wound care with wound vac per vascular If continues to improve then at DC would send home on oral keflex 500 mg TID for a 14 day course. I should see in clinic prior to stopping his antibiotics.  Thank you very much for the consult. Will follow with you.  Leonel Ramsay   10/14/2017, 3:17 PM

## 2017-10-14 NOTE — Transfer of Care (Signed)
Immediate Anesthesia Transfer of Care Note  Patient: [name]  Procedure(s) Performed: WOUND VAC CHANGE (Left )  Patient Location: PACU  Anesthesia Type:General  Level of Consciousness: awake, alert  and responds to stimulation  Airway & Oxygen Therapy: Patient Spontanous Breathing and Patient connected to face mask oxygen  Post-op Assessment: Report given to RN and Post -op Vital signs reviewed and stable  Post vital signs: Reviewed and stable  Last Vitals:  Vitals:   10/14/17 1119 10/14/17 1346  BP: 140/67 (!) 149/71  Pulse: 63 70  Resp: 17 18  Temp: (!) 36.4 C   SpO2: 98% 92%    Last Pain:  Vitals:   10/14/17 1119  TempSrc: Tympanic  PainSc: 1       Patients Stated Pain Goal: 2 (96/43/83 8184)  Complications: No apparent anesthesia complications

## 2017-10-14 NOTE — Progress Notes (Signed)
Pharmacy Antibiotic Note  [name] is a 47 y.o. male admitted on 10/01/2017 with LLE swelling and drainage s/p I and D.  Pharmacy has been consulted for Unasyn dosing transition from Zosyn per ID.   MSSA recovered from 2/8 wound cx and 2/12 cx growing Staph.  Plan: Although patient is in acute renal failure, SCr has not changed much since admission. It is hard to determine what GFR is in this setting. Because of the severity of infection, will continue Unasyn 3 g iv q 6 hours.   Height: 6\' 4"  (193 cm) Weight: (!) 421 lb (191 kg) IBW/kg (Calculated) : 86.8  Temp (24hrs), Avg:98.6 F (37 C), Min:97.5 F (36.4 C), Max:99.7 F (37.6 C)  Recent Labs  Lab 10/09/17 0334 10/10/17 0313 10/11/17 0343 10/12/17 0412 10/13/17 0426 10/14/17 0434  WBC  --  7.3  --  6.9  --  7.9  CREATININE 2.70* 2.50* 2.54* 2.71* 2.81* 2.43*  VANCORANDOM 17  --  14  --   --   --     Estimated Creatinine Clearance: 67.6 mL/min (A) (by C-G formula based on SCr of 2.43 mg/dL (H)).    Allergies  Allergen Reactions  . Lisinopril Swelling    Antimicrobials this admission: vancomycin 2/2 >> 2/12 Zosyn 2/2 >> 2/14 Unasyn 2/14 >>  Dose adjustments this admission:   Microbiology results: 2/2 BCx: NG 2/4 MRSA PCR: negative 2/12 Wound Cx: Staph aureus 2/8 Wound Cx: MSSA  Thank you for allowing pharmacy to be a part of this patient's care.  Ulice Dash D 10/14/2017 1:08 PM

## 2017-10-14 NOTE — Anesthesia Postprocedure Evaluation (Signed)
Anesthesia Post Note  Patient: [name]  Procedure(s) Performed: WOUND VAC CHANGE (Left )  Patient location during evaluation: PACU Anesthesia Type: General Level of consciousness: awake and alert and oriented Pain management: pain level controlled Vital Signs Assessment: post-procedure vital signs reviewed and stable Respiratory status: spontaneous breathing Cardiovascular status: blood pressure returned to baseline Anesthetic complications: no     Last Vitals:  Vitals:   10/14/17 1428 10/14/17 1435  BP:  110/60  Pulse: 64 65  Resp: 14 12  Temp: (!) 36.1 C   SpO2: 96% 94%    Last Pain:  Vitals:   10/14/17 1428  TempSrc:   PainSc: 2                  Ryeleigh Santore

## 2017-10-14 NOTE — Progress Notes (Signed)
Pharmacy Antibiotic Note  [name] is a 47 y.o. male admitted on 10/01/2017 with LLE swelling and drainage s/p I and D.  Pharmacy has been consulted for Unasyn dosing transition from Zosyn per ID. ID further narrowing Unasyn to cefazolin.  MSSA recovered from 2/8 wound cx and 2/12 cx growing Staph.  Plan: Although patient is in acute renal failure, SCr has not changed much since admission. It is hard to determine what GFR is in this setting. Because of the severity of the infection will dose cefazolin 1 g iv q 8 hours.   Height: 6\' 4"  (193 cm) Weight: (!) 421 lb (191 kg) IBW/kg (Calculated) : 86.8  Temp (24hrs), Avg:98.1 F (36.7 C), Min:96.8 F (36 C), Max:99.7 F (37.6 C)  Recent Labs  Lab 10/09/17 0334 10/10/17 0313 10/11/17 0343 10/12/17 0412 10/13/17 0426 10/14/17 0434  WBC  --  7.3  --  6.9  --  7.9  CREATININE 2.70* 2.50* 2.54* 2.71* 2.81* 2.43*  VANCORANDOM 17  --  14  --   --   --     Estimated Creatinine Clearance: 67.6 mL/min (A) (by C-G formula based on SCr of 2.43 mg/dL (H)).    Allergies  Allergen Reactions  . Lisinopril Swelling    Antimicrobials this admission: vancomycin 2/2 >> 2/12 Zosyn 2/2 >> 2/14 Unasyn 2/14 >> 2/15 Cefazolin 2/15 >> Dose adjustments this admission:   Microbiology results: 2/2 BCx: NG 2/4 MRSA PCR: negative 2/12 Wound Cx: MSSA 2/8 Wound Cx: MSSA  Thank you for allowing pharmacy to be a part of this patient's care.  Napoleon Form 10/14/2017 3:32 PM

## 2017-10-14 NOTE — Progress Notes (Signed)
PT Cancellation Note  Patient Details Name: Jaelin Fackler MRN: [id number] DOB: 09-Oct-1970   Cancelled Treatment:    Reason Eval/Treat Not Completed: Patient at procedure or test/unavailable.  Per RN the OR has called and will be coming to get pt for wound change and debridement.  Will attempt to see pt again later today, schedule permitting.     Collie Siad PT, DPT 10/14/2017, 9:47 AM

## 2017-10-14 NOTE — Op Note (Signed)
    OPERATIVE NOTE   PROCEDURE: Replacement of VAC dressings left leg  PRE-OPERATIVE DIAGNOSIS: Left leg abscesses status post I&D; cellulitis left leg; venous stasis dermatitis  POST-OPERATIVE DIAGNOSIS: Same  SURGEON: [name]  ASSISTANT(S): Ms. Hezzie Bump  ANESTHESIA: general  ESTIMATED BLOOD LOSS: Less than 10 cc  FINDING(S): Ankle area is well granulated undermining is now almost resolved  SPECIMEN(S): None  INDICATIONS:   [name] is a 47 y.o. male who presents with multiple abscesses.  Because of the size of the wound he is undergoing VAC dressing change under anesthesia.  DESCRIPTION: After full informed written consent was obtained from the patient, the patient was brought back to the operating room and placed supine upon the operating table.  Prior to induction, the patient received IV antibiotics.   After obtaining adequate anesthesia, the patient was then prepped and draped in the standard fashion for a VAC dressing change.  Previous dressings were removed there is a dressing on the posterior lateral ankle and the proximal medial calf area.  Wounds were then irrigated and inspected the ankle wound is virtually granulated.  The undermining is now almost resolved superiorly and inferiorly.  The proximal calf abscess is clean.  VAC sponges were then placed in the wounds and negative pressure applied Y connector was utilized.   The patient tolerated this procedure well.   COMPLICATIONS: None  CONDITION: [name]  Office: [phone]   10/14/2017, 1:51 PM

## 2017-10-14 NOTE — Progress Notes (Signed)
Patient returned from OR via room bed. Wound vac attached with a y-connector. Patient is sleepy, but able to answer questions. VSS. Bed alarm on for safety.

## 2017-10-14 NOTE — Anesthesia Post-op Follow-up Note (Signed)
Anesthesia QCDR form completed.        

## 2017-10-14 NOTE — Progress Notes (Signed)
Patient went to OR. Signed consent and IV ABX sent with patient.

## 2017-10-14 NOTE — Anesthesia Procedure Notes (Signed)
Procedure Name: Intubation Performed by: Demetrius Charity, CRNA Pre-anesthesia Checklist: Patient identified, Patient being monitored, Timeout performed, Emergency Drugs available and Suction available Patient Re-evaluated:Patient Re-evaluated prior to induction Oxygen Delivery Method: Circle system utilized Preoxygenation: Pre-oxygenation with 100% oxygen Induction Type: IV induction Ventilation: Two handed mask ventilation required and Oral airway inserted - appropriate to patient size Laryngoscope Size: 3 and McGraph Grade View: Grade I Tube type: Oral Tube size: 7.5 mm Number of attempts: 1 Airway Equipment and Method: Stylet Placement Confirmation: ETT inserted through vocal cords under direct vision,  positive ETCO2 and breath sounds checked- equal and bilateral Secured at: 25 cm Tube secured with: Tape Dental Injury: Teeth and Oropharynx as per pre-operative assessment

## 2017-10-14 NOTE — Anesthesia Preprocedure Evaluation (Signed)
Anesthesia Evaluation  Patient identified by MRN, date of birth, ID band Patient awake    Reviewed: Allergy & Precautions, NPO status , Patient's Chart, lab work & pertinent test results  History of Anesthesia Complications Negative for: history of anesthetic complications  Airway Mallampati: II  TM Distance: >3 FB Neck ROM: Full    Dental  (+) Partial Upper   Pulmonary neg pulmonary ROS, neg sleep apnea, neg COPD,    breath sounds clear to auscultation- rhonchi (-) wheezing      Cardiovascular hypertension, Pt. on medications + Peripheral Vascular Disease  (-) CAD, (-) Past MI, (-) Cardiac Stents and (-) CABG  Rhythm:Regular Rate:Normal - Systolic murmurs and - Diastolic murmurs    Neuro/Psych negative neurological ROS  negative psych ROS   GI/Hepatic negative GI ROS, Neg liver ROS,   Endo/Other  diabetes, Insulin Dependent  Renal/GU Renal InsufficiencyRenal disease     Musculoskeletal negative musculoskeletal ROS (+)   Abdominal (+) + obese,   Peds  Hematology negative hematology ROS (+)   Anesthesia Other Findings Past Medical History: No date: Diabetes mellitus without complication (HCC) No date: Hypertension No date: Microalbuminuria No date: Vitamin D deficiency   Reproductive/Obstetrics                             Anesthesia Physical  Anesthesia Plan  ASA: III  Anesthesia Plan: General   Post-op Pain Management:    Induction: Intravenous  PONV Risk Score and Plan: 1 and Dexamethasone and Ondansetron  Airway Management Planned: Oral ETT  Additional Equipment:   Intra-op Plan:   Post-operative Plan: Extubation in OR  Informed Consent: I have reviewed the patients History and Physical, chart, labs and discussed the procedure including the risks, benefits and alternatives for the proposed anesthesia with the patient or authorized representative who has indicated  his/her understanding and acceptance.   Dental advisory given  Plan Discussed with: CRNA and Anesthesiologist  Anesthesia Plan Comments:         Anesthesia Quick Evaluation

## 2017-10-15 ENCOUNTER — Inpatient Hospital Stay: Payer: BLUE CROSS/BLUE SHIELD

## 2017-10-15 ENCOUNTER — Inpatient Hospital Stay
Admit: 2017-10-15 | Discharge: 2017-10-15 | Disposition: A | Discharge status: Home / Self Care | Payer: BLUE CROSS/BLUE SHIELD | Attending: Internal Medicine | Admitting: Internal Medicine

## 2017-10-15 ENCOUNTER — Encounter: Payer: Self-pay | Admitting: Vascular Surgery

## 2017-10-15 LAB — CBC
HCT: 25 % — ABNORMAL LOW (ref 40.0–52.0)
HEMOGLOBIN: 8.2 g/dL — AB (ref 13.0–18.0)
MCH: 29.4 pg (ref 26.0–34.0)
MCHC: 32.7 g/dL (ref 32.0–36.0)
MCV: 89.8 fL (ref 80.0–100.0)
PLATELETS: 334 10*3/uL (ref 150–440)
RBC: 2.78 MIL/uL — ABNORMAL LOW (ref 4.40–5.90)
RDW: 15 % — AB (ref 11.5–14.5)
WBC: 5.8 10*3/uL (ref 3.8–10.6)

## 2017-10-15 LAB — BASIC METABOLIC PANEL
ANION GAP: 6 (ref 5–15)
BUN: 40 mg/dL — AB (ref 6–20)
CO2: 22 mmol/L (ref 22–32)
CREATININE: 2.45 mg/dL — AB (ref 0.61–1.24)
Calcium: 8.5 mg/dL — ABNORMAL LOW (ref 8.9–10.3)
Chloride: 111 mmol/L (ref 101–111)
GFR calc Af Amer: 34 mL/min — ABNORMAL LOW (ref >60)
GFR, EST NON AFRICAN AMERICAN: 30 mL/min — AB (ref >60)
GLUCOSE: 133 mg/dL — AB (ref 65–99)
Potassium: 4.5 mmol/L (ref 3.5–5.1)
Sodium: 139 mmol/L (ref 135–145)

## 2017-10-15 LAB — RETICULOCYTES
RBC.: 2.79 MIL/uL — AB (ref 4.40–5.90)
RETIC CT PCT: 2 % (ref 0.4–3.1)
Retic Count, Absolute: 55.8 10*3/uL (ref 19.0–183.0)

## 2017-10-15 LAB — GLUCOSE, CAPILLARY
GLUCOSE-CAPILLARY: 127 mg/dL — AB (ref 65–99)
GLUCOSE-CAPILLARY: 124 mg/dL — AB (ref 65–99)
Glucose-Capillary: 122 mg/dL — ABNORMAL HIGH (ref 65–99)
Glucose-Capillary: 121 mg/dL — ABNORMAL HIGH (ref 65–99)

## 2017-10-15 LAB — IRON AND TIBC
Iron: 32 ug/dL — ABNORMAL LOW (ref 45–182)
SATURATION RATIOS: 17 % — AB (ref 17.9–39.5)
TIBC: 188 ug/dL — AB (ref 250–450)
UIBC: 156 ug/dL

## 2017-10-15 LAB — FOLATE: FOLATE: 9.1 ng/mL (ref >5.9)

## 2017-10-15 LAB — FERRITIN: Ferritin: 298 ng/mL (ref 24–336)

## 2017-10-15 LAB — VITAMIN B12: VITAMIN B 12: 1668 pg/mL — AB (ref 180–914)

## 2017-10-15 MED ORDER — FUROSEMIDE 10 MG/ML IJ SOLN
40.0000 mg | Freq: Every day | INTRAMUSCULAR | Status: DC
  Administered 2017-10-15 – 2017-10-17 (×3): 40 mg via INTRAVENOUS
  Filled 2017-10-15 (×3): qty 4

## 2017-10-15 NOTE — Evaluation (Signed)
Physical Therapy Re-Evaluation Patient Details Name: [name] MRN: [id number] DOB: 10/03/1970 Today's Date: 10/15/2017   History of Present Illness  Sheron Robin is a 47yo black male multiple I&D surgery L ankle and reapplication of vac, R thigh abscess had I and D yesterday at the same time, cellulitis LLE. NWB previously on LLE and orders were not on chart for WB at all on either leg.  PMHx: hyperglycemia, LLE cellulitis, HTN, DM, CKD 3, venous stasis ulcers LLE. PT reassessment on 2/16 after I&D on 2/15.    Clinical Impression  Pt admitted with above diagnosis. Pt currently with functional limitations due to the deficits listed below (see "PT Problem List"). Upon entry, the patient is received EOB mobilizing to [facility] with student-RN, no family/caregiver present.  Pt uis assisted back to bed by PT once finished ADLing. The pt is awake and agreeable to participate. No acute distress noted at this time, although patient expresses feeling discouraged and frustrated with current length of stay and state of illness, as well as decline in function. The pt is alert and oriented x3, pleasant, conversational, and following simple and multi-step commands consistently. Pt received on room air (during toiletting) c SpO2 >83%, returned to 2L/min and SpO2: 96%. Pt initially performing lateral scoot transfer with Student RN, not maintain NWB LLE; educated on how to perform NWB transfers with RW, which he is able to perform 2 only with bed heigh adjusted to patient height (knee at 90 degrees seated), but elevated further to keep STS transfers closer to 80% 1RM for additional reps/practice.  Functional mobility assessment demonstrates moderate-heavy strength impairment in trunk and BLE, the pt now requiring maximal effort and elevated surface for transfers and unable to perform AMB, whereas the patient performed these at a higher level of independence PTA. AMB is also withheld in the setting of acute onset anemia,  Hb: 8.2 down from 12.0 at admission, HCT: 25.0, down from 38.1 at admission, pt reporting persistent dizziness upon sitting upright. Pt will benefit from skilled PT intervention to increase independence and safety with basic mobility in preparation for discharge to the venue listed below.       Follow Up Recommendations SNF(encouraged patient to consider CIR, but he is not interested in being that far from family. )    Equipment Recommendations  (Bariatric Rolling walker, bariatric 3-in-1 for toiletting and showering.)    Recommendations for Other Services       Precautions / Restrictions Precautions Precautions: Fall Restrictions LLE Weight Bearing: Non weight bearing      Mobility  Bed Mobility Overal bed mobility: Modified Independent                Transfers Overall transfer level: Needs assistance Equipment used: (bariatric RW; heavy VC adn education for Left NWB ) Transfers: Lateral/Scoot Transfers Sit to Stand: From elevated surface;Min guard;Supervision         General transfer comment: FAtigue after 2 attempts, but with bed elevated, more able.   Ambulation/Gait Ambulation/Gait assistance: (held due to anemia + dizziness)              Stairs            Wheelchair Mobility    Modified Rankin (Stroke Patients Only)       Balance           Standing balance support: Bilateral upper extremity supported;During functional activity Standing balance-Leahy Scale: Poor  Pertinent Vitals/Pain Pain Assessment: No/denies pain    Home Living Family/patient expects to be discharged to:: Private residence Living Arrangements: Alone Available Help at Discharge: Family(mother (lives 'nextdoor')) Type of Home: House Home Access: Stairs to enter Entrance Stairs-Rails: Left(left upon entry) Entrance Stairs-Number of Steps: 4 Home Layout: Two level;Able to live on main level with bedroom/bathroom Home  Equipment: None      Prior Function Level of Independence: Independent         Comments: community dwelling adult, works for [employer], requires driving, sitting, walking, etc.      Hand Dominance        Extremity/Trunk Assessment   Upper Extremity Assessment Upper Extremity Assessment: Generalized weakness    Lower Extremity Assessment Lower Extremity Assessment: Generalized weakness       Communication   Communication: No difficulties  Cognition Arousal/Alertness: Awake/alert Behavior During Therapy: WFL for tasks assessed/performed Overall Cognitive Status: Within Functional Limits for tasks assessed                                        General Comments      Exercises Other Exercises Other Exercises: 5x STS with LLE NWB and bari RW from elevated surface. Requires significant rest between efforts.    Assessment/Plan    PT Assessment Patient needs continued PT services  PT Problem List Decreased strength;Decreased range of motion;Decreased activity tolerance;Decreased balance;Decreased mobility;Decreased coordination;Decreased knowledge of use of DME;Decreased safety awareness;Cardiopulmonary status limiting activity;Obesity;Decreased skin integrity       PT Treatment Interventions DME instruction;Gait training;Stair training;Functional mobility training;Balance training;Therapeutic exercise;Therapeutic activities;Patient/family education    PT Goals (Current goals can be found in the Care Plan section)  Acute Rehab PT Goals Patient Stated Goal: get moving independently PT Goal Formulation: With patient Time For Goal Achievement: 10/29/17 Potential to Achieve Goals: Good    Frequency Min 2X/week   Barriers to discharge Inaccessible home environment;Decreased caregiver support      Co-evaluation               AM-PAC PT "6 Clicks" Daily Activity  Outcome Measure Difficulty turning over in bed (including adjusting bedclothes,  sheets and blankets)?: A Little Difficulty moving from lying on back to sitting on the side of the bed? : A Little Difficulty sitting down on and standing up from a chair with arms (e.g., wheelchair, bedside commode, etc,.)?: A Lot Help needed moving to and from a bed to chair (including a wheelchair)?: A Lot Help needed walking in hospital room?: A Lot Help needed climbing 3-5 steps with a railing? : Total 6 Click Score: 13    End of Session Equipment Utilized During Treatment: Gait belt Activity Tolerance: Patient tolerated treatment well;Patient limited by fatigue;Treatment limited secondary to medical complications (Comment)(anemia ) Patient left: in bed;with call bell/phone within reach Nurse Communication: Mobility status PT Visit Diagnosis: Difficulty in walking, not elsewhere classified (R26.2);Muscle weakness (generalized) (M62.81);Other abnormalities of gait and mobility (R26.89)    Time: 4081-4481 PT Time Calculation (min) (ACUTE ONLY): 49 min   Charges:   PT Evaluation $PT Re-evaluation: 1 Re-eval PT Treatments $Therapeutic Activity: 8-22 mins   PT G Codes:   PT G-Codes **NOT FOR INPATIENT CLASS** Functional Assessment Tool Used: AM-PAC 6 Clicks Basic Mobility   12:12 PM, 10/15/17 Etta Grandchild, PT, DPT Physical Therapist - [place] [phone] ([place])  [phone] (mobile)   Buccola,Allan C 10/15/2017, 12:05 PM

## 2017-10-15 NOTE — Progress Notes (Addendum)
[place] at [place] NAME: [name]    MR#:  [id number]  DATE OF BIRTH:  Jun 16, 1971  SUBJECTIVE:   Patient doing ok this am Wound vacchanged yesterday Denies SOB but on O2 this am  REVIEW OF SYSTEMS:    Review of Systems  Constitutional: Negative for fever, chills weight loss HENT: Negative for ear pain, nosebleeds, congestion, facial swelling, rhinorrhea, neck pain, neck stiffness and ear discharge.   Respiratory: Negative for cough, shortness of breath, wheezing  Cardiovascular: Negative for chest pain, palpitations and leg swelling.  Gastrointestinal: Negative for heartburn, abdominal pain, vomiting, diarrhea or consitpation Genitourinary: Negative for dysuria, urgency, frequency, hematuria Musculoskeletal: Negative for back pain or joint pain Neurological: Negative for dizziness, seizures, syncope, focal weakness,  numbness and headaches.  Hematological: Does not bruise/bleed easily.  Psychiatric/Behavioral: Negative for hallucinations, confusion, dysphoric mood SKIN: wound vac placed LLE   Tolerating Diet: yes      DRUG ALLERGIES:   Allergies  Allergen Reactions  . Lisinopril Swelling    VITALS:  Blood pressure 131/64, pulse 65, temperature 98 F (36.7 C), temperature source Oral, resp. rate 18, height 6\' 4"  (1.93 m), weight (!) 192.4 kg (424 lb 3.2 oz), SpO2 95 %.  PHYSICAL EXAMINATION:  Constitutional: Appears well-developed and well-nourished. No distress. HENT: Normocephalic. [name] Oropharynx is clear and moist.  Eyes: Conjunctivae and EOM are normal. PERRLA, no scleral icterus.  Neck: Normal ROM. Neck supple. No JVD. No tracheal deviation. CVS: RRR, S1/S2 +, 2/6 [name], no gallops, no carotid bruit.  Pulmonary: Effort and breath sounds normal, no stridor, rhonchi, wheezes, rales.  Abdominal: Soft. BS +,  no distension, tenderness, rebound or guarding.  Musculoskeletal:no issues 2+ LEE Neuro: Alert. CN 2-12 grossly  intact. No focal deficits. Skin: wound vac LLE  Psychiatric: Normal mood and affect.      LABORATORY PANEL:   CBC Recent Labs  Lab 10/14/17 0434  WBC 7.9  HGB 7.9*  HCT 24.0*  PLT 399   ------------------------------------------------------------------------------------------------------------------  Chemistries  Recent Labs  Lab 10/14/17 0434  NA 141  K 4.7  CL 110  CO2 25  GLUCOSE 132*  BUN 42*  CREATININE 2.43*  CALCIUM 8.7*  MG 1.7   ------------------------------------------------------------------------------------------------------------------  Cardiac Enzymes No results for input(s): TROPONINI in the last 168 hours. ------------------------------------------------------------------------------------------------------------------  RADIOLOGY:  No results found.   ASSESSMENT AND PLAN:   47 year old male with morbid obesity, poorly controlled diabetes with A1c 15.1 who was admitted to the hospital with worsening left lower extremity infection.  1. MSSA skin and soft tissue infection status post wound VAC Continue Ancef as per ID recommendations Continue wound care with wound VAC as per vascular surgery At the time of discharge he may be able to be discharged on oral Keflex 500 mg 3 times a day for 14 day course.  2. Acute hypoxic respiratory failure: Wean oxygen to room air as tolerated Check x-ray and echocardiogram Continue ISS  3. Lower extremity edema: Check echocardiogram  4. Uncontrolled diabetes: Continue Lantus, NovoLog with sliding scale and ADA diet  5. Acute on chronic anemia: Check anemia panel Check CBC for this a.m.  6. Essential hypertension: Continue atenolol and Norvasc (May need to discontinue Norvasc if this is contributing to lower extremity edema)  7. Acute on chronic kidney disease stage III: Creatinine slowly improving This may be patient's new baseline Patient will need outpatient follow-up nephrology upon  discharge   Physical therapy is recommending rehabilitation at discharge.  Management plans discussed with the patient and he is in agreement.  CODE STATUS: FULL  TOTAL TIME TAKING CARE OF THIS PATIENT: 29 minutes.     POSSIBLE D/C 1-2 days, DEPENDING ON CLINICAL CONDITION.   Gretta Samons M.D on 10/15/2017 at 8:12 AM  Between 7am to 6pm - Pager - [phone] After 6pm go to [email or web address] - password [email or web address] [email or web address]  Office  [phone]  CC: Primary care physician; Sallee Lange, NP  Note: This dictation was prepared with Dragon dictation along with smaller phrase technology. Any transcriptional errors that result from this process are unintentional.

## 2017-10-15 NOTE — Progress Notes (Signed)
Patient a&o, vss. Wound vac in place on left leg. Dressing on right inner thigh changed per orders. No complaints at this time. Continue to monitor.

## 2017-10-15 NOTE — Progress Notes (Signed)
Patient is A&Ox4. VS stable. Marked pitting edema bilateral lower extremities.Denies pain and discomfort. Approximately 272mL serosanguinous  Drainage collected via wound vac. Awaiting dressing change orders for the RLE inner thigh. Will continue to monitor for changes. Encouraged incentive spirometry through out the day. Will continue to monitor for changes.   Dorna Bloom, SN [facility]

## 2017-10-16 LAB — GLUCOSE, CAPILLARY
GLUCOSE-CAPILLARY: 88 mg/dL (ref 65–99)
GLUCOSE-CAPILLARY: 124 mg/dL — AB (ref 65–99)
Glucose-Capillary: 139 mg/dL — ABNORMAL HIGH (ref 65–99)
Glucose-Capillary: 170 mg/dL — ABNORMAL HIGH (ref 65–99)

## 2017-10-16 LAB — BASIC METABOLIC PANEL
ANION GAP: 5 (ref 5–15)
BUN: 46 mg/dL — ABNORMAL HIGH (ref 6–20)
CO2: 23 mmol/L (ref 22–32)
Calcium: 8.5 mg/dL — ABNORMAL LOW (ref 8.9–10.3)
Chloride: 111 mmol/L (ref 101–111)
Creatinine, Ser: 2.15 mg/dL — ABNORMAL HIGH (ref 0.61–1.24)
GFR calc Af Amer: 40 mL/min — ABNORMAL LOW (ref >60)
GFR, EST NON AFRICAN AMERICAN: 35 mL/min — AB (ref >60)
GLUCOSE: 94 mg/dL (ref 65–99)
POTASSIUM: 4.2 mmol/L (ref 3.5–5.1)
SODIUM: 139 mmol/L (ref 135–145)

## 2017-10-16 LAB — CBC
HCT: 25 % — ABNORMAL LOW (ref 40.0–52.0)
Hemoglobin: 8.3 g/dL — ABNORMAL LOW (ref 13.0–18.0)
MCH: 29.6 pg (ref 26.0–34.0)
MCHC: 33 g/dL (ref 32.0–36.0)
MCV: 89.5 fL (ref 80.0–100.0)
PLATELETS: 334 10*3/uL (ref 150–440)
RBC: 2.79 MIL/uL — AB (ref 4.40–5.90)
RDW: 15 % — ABNORMAL HIGH (ref 11.5–14.5)
WBC: 6.3 10*3/uL (ref 3.8–10.6)

## 2017-10-16 LAB — ECHOCARDIOGRAM COMPLETE
Height: 76 in
WEIGHTICAEL: 6787.2 [oz_av]

## 2017-10-16 NOTE — Progress Notes (Signed)
Dressing to R thigh was repacked and dressed. No further concerns at this time.

## 2017-10-16 NOTE — Progress Notes (Signed)
[place] at [place] NAME: [name]    MR#:  [id number]  DATE OF BIRTH:  1971-07-02  SUBJECTIVE:   Patient doing well this morning. No acute issues. He did not sleep well last night due to the noise from other patients room.  REVIEW OF SYSTEMS:    Review of Systems  Constitutional: Negative for fever, chills weight loss HENT: Negative for ear pain, nosebleeds, congestion, facial swelling, rhinorrhea, neck pain, neck stiffness and ear discharge.   Respiratory: Negative for cough, shortness of breath, wheezing  Cardiovascular: Negative for chest pain, palpitations and leg swelling.  Gastrointestinal: Negative for heartburn, abdominal pain, vomiting, diarrhea or consitpation Genitourinary: Negative for dysuria, urgency, frequency, hematuria Musculoskeletal: Negative for back pain or joint pain Neurological: Negative for dizziness, seizures, syncope, focal weakness,  numbness and headaches.  Hematological: Does not bruise/bleed easily.  Psychiatric/Behavioral: Negative for hallucinations, confusion, dysphoric mood SKIN: wound vac placed LLE   Tolerating Diet: yes      DRUG ALLERGIES:   Allergies  Allergen Reactions  . Lisinopril Swelling    VITALS:  Blood pressure (!) 154/76, pulse 72, temperature 98.8 F (37.1 C), temperature source Oral, resp. rate 19, height 6\' 4"  (1.93 m), weight (!) 192.4 kg (424 lb 3.2 oz), SpO2 93 %.  PHYSICAL EXAMINATION:  Constitutional: Appears well-developed and well-nourished. No distress. HENT: Normocephalic. [name] Oropharynx is clear and moist.  Eyes: Conjunctivae and EOM are normal. PERRLA, no scleral icterus.  Neck: Normal ROM. Neck supple. No JVD. No tracheal deviation. CVS: RRR, S1/S2 +, 2/6 [name], no gallops, no carotid bruit.  Pulmonary: Effort and breath sounds normal, no stridor, rhonchi, wheezes, rales.  Abdominal: Soft. BS +,  no distension, tenderness, rebound or guarding.   Musculoskeletal:no issues 2+ LEE Neuro: Alert. CN 2-12 grossly intact. No focal deficits. Skin: wound vac LLE  Psychiatric: Normal mood and affect.      LABORATORY PANEL:   CBC Recent Labs  Lab 10/16/17 0350  WBC 6.3  HGB 8.3*  HCT 25.0*  PLT 334   ------------------------------------------------------------------------------------------------------------------  Chemistries  Recent Labs  Lab 10/14/17 0434  10/16/17 0350  NA 141   < > 139  K 4.7   < > 4.2  CL 110   < > 111  CO2 25   < > 23  GLUCOSE 132*   < > 94  BUN 42*   < > 46*  CREATININE 2.43*   < > 2.15*  CALCIUM 8.7*   < > 8.5*  MG 1.7  --   --    < > = values in this interval not displayed.   ------------------------------------------------------------------------------------------------------------------  Cardiac Enzymes No results for input(s): TROPONINI in the last 168 hours. ------------------------------------------------------------------------------------------------------------------  RADIOLOGY:  Dg Chest 1 View  Result Date: 10/15/2017 CLINICAL DATA:  Hypoxia, lower extremity cellulitis. EXAM: CHEST 1 VIEW COMPARISON:  None. FINDINGS: Trachea is midline. Heart is enlarged. There may be mild interstitial prominence and indistinctness. Probable left lower lobe subsegmental atelectasis. No pleural fluid. IMPRESSION: Suspect mild edema. Electronically Signed   By: Lorin Picket M.D.   On: 10/15/2017 08:40     ASSESSMENT AND PLAN:   47 year old male with morbid obesity, poorly controlled diabetes with A1c 15.1 who was admitted to the hospital with worsening left lower extremity infection.  1. MSSA skin and soft tissue infection status post wound VAC Continue Ancef as per ID recommendations Continue wound care with wound VAC as per vascular surgery At the time  of discharge he may be able to be discharged on oral Keflex 500 mg 3 times a day for 14 day course. As per my conversation with vascular  surgery today patient is planned to go back to the OR later this week.   2. Acute hypoxic respiratory failure: Wean oxygen to room air as tolerated Follow-up on echocardiogram to evaluate for underlying congestive heart failure  Continue ISS  3. Lower extremity edema: Follow up on results of echocardiogram Continue IV Lasix for now Monitor intake and output with daily weight  4. Uncontrolled diabetes: Hemoglobin A1c 15.1 Continue Lantus, NovoLog with sliding scale and ADA diet  5. Acute on chronic anemia: Anemia panel consistent with anemia of chronic disease   6. Essential hypertension: Continue atenolol and Norvasc (May need to discontinue Norvasc if this is contributing to lower extremity edema)  7. Acute on chronic kidney disease stage III: Creatinine slowly improving This may be patient's new baseline Patient will need outpatient follow-up nephrology upon discharge   Physical therapy is recommending rehabilitation at discharge.    Management plans discussed with the patient and he is in agreement.  CODE STATUS: FULL  TOTAL TIME TAKING CARE OF THIS PATIENT: 24 minutes.     POSSIBLE D/C 2-3 days, DEPENDING ON CLINICAL CONDITION.   Jevin Camino M.D on 10/16/2017 at 8:23 AM  Between 7am to 6pm - Pager - [phone] After 6pm go to [email or web address] - password [email or web address] [place] [facility]  Office  [phone]  CC: Primary care physician; Sallee Lange, NP  Note: This dictation was prepared with Dragon dictation along with smaller phrase technology. Any transcriptional errors that result from this process are unintentional.

## 2017-10-17 LAB — BASIC METABOLIC PANEL
ANION GAP: 6 (ref 5–15)
BUN: 42 mg/dL — ABNORMAL HIGH (ref 6–20)
CALCIUM: 8.7 mg/dL — AB (ref 8.9–10.3)
CO2: 23 mmol/L (ref 22–32)
Chloride: 110 mmol/L (ref 101–111)
Creatinine, Ser: 2.15 mg/dL — ABNORMAL HIGH (ref 0.61–1.24)
GFR, EST AFRICAN AMERICAN: 40 mL/min — AB (ref >60)
GFR, EST NON AFRICAN AMERICAN: 35 mL/min — AB (ref >60)
Glucose, Bld: 134 mg/dL — ABNORMAL HIGH (ref 65–99)
Potassium: 4.2 mmol/L (ref 3.5–5.1)
Sodium: 139 mmol/L (ref 135–145)

## 2017-10-17 LAB — GLUCOSE, CAPILLARY
GLUCOSE-CAPILLARY: 119 mg/dL — AB (ref 65–99)
GLUCOSE-CAPILLARY: 139 mg/dL — AB (ref 65–99)
GLUCOSE-CAPILLARY: 90 mg/dL (ref 65–99)
Glucose-Capillary: 127 mg/dL — ABNORMAL HIGH (ref 65–99)

## 2017-10-17 LAB — AEROBIC/ANAEROBIC CULTURE (SURGICAL/DEEP WOUND)

## 2017-10-17 LAB — AEROBIC/ANAEROBIC CULTURE W GRAM STAIN (SURGICAL/DEEP WOUND)

## 2017-10-17 MED ORDER — HYDRALAZINE HCL 20 MG/ML IJ SOLN
10.0000 mg | Freq: Four times a day (QID) | INTRAMUSCULAR | Status: DC | PRN
  Administered 2017-10-17: 10 mg via INTRAVENOUS
  Filled 2017-10-17: qty 1

## 2017-10-17 MED ORDER — ATENOLOL 25 MG PO TABS
50.0000 mg | ORAL_TABLET | Freq: Every day | ORAL | Status: DC
  Administered 2017-10-17: 50 mg via ORAL
  Filled 2017-10-17: qty 2

## 2017-10-17 NOTE — Progress Notes (Signed)
Per chart patient is not stable for D/C today. [employer] authorization has expired today. Per [facility] admissions coordinator at [employer] they will start [facility] authorization for the third time.   [employer], LCSW [phone]

## 2017-10-17 NOTE — Progress Notes (Signed)
[place] at [place] NAME: [name]    MR#:  [id number]  DATE OF BIRTH:  May 10, 1971  SUBJECTIVE:   No acute issues overnight. Patient continues to have lower extremity edema  REVIEW OF SYSTEMS:    Review of Systems  Constitutional: Negative for fever, chills weight loss HENT: Negative for ear pain, nosebleeds, congestion, facial swelling, rhinorrhea, neck pain, neck stiffness and ear discharge.   Respiratory: Negative for cough, shortness of breath, wheezing  Cardiovascular: Negative for chest pain, palpitations and leg swelling.  Gastrointestinal: Negative for heartburn, abdominal pain, vomiting, diarrhea or consitpation Genitourinary: Negative for dysuria, urgency, frequency, hematuria Musculoskeletal: Negative for back pain or joint pain Neurological: Negative for dizziness, seizures, syncope, focal weakness,  numbness and headaches.  Hematological: Does not bruise/bleed easily.  Psychiatric/Behavioral: Negative for hallucinations, confusion, dysphoric mood SKIN: wound vac placed LLE   Tolerating Diet: yes      DRUG ALLERGIES:   Allergies  Allergen Reactions  . Lisinopril Swelling    VITALS:  Blood pressure (!) 147/69, pulse 72, temperature 98.3 F (36.8 C), temperature source Oral, resp. rate 20, height 6\' 4"  (1.93 m), weight (!) 190.3 kg (419 lb 9.6 oz), SpO2 95 %.  PHYSICAL EXAMINATION:  Constitutional: Appears well-developed and well-nourished. No distress. HENT: Normocephalic. [name] Oropharynx is clear and moist.  Eyes: Conjunctivae and EOM are normal. PERRLA, no scleral icterus.  Neck: Normal ROM. Neck supple. No JVD. No tracheal deviation. CVS: RRR, S1/S2 +, 2/6 [name], no gallops, no carotid bruit.  Pulmonary: Effort and breath sounds normal, no stridor, rhonchi, wheezes, rales.  Abdominal: Soft. BS +,  no distension, tenderness, rebound or guarding.  Musculoskeletal:no issues 2+ LEE Neuro: Alert. CN 2-12 grossly  intact. No focal deficits. Skin: wound vac LLE  Psychiatric: Normal mood and affect.      LABORATORY PANEL:   CBC Recent Labs  Lab 10/16/17 0350  WBC 6.3  HGB 8.3*  HCT 25.0*  PLT 334   ------------------------------------------------------------------------------------------------------------------  Chemistries  Recent Labs  Lab 10/14/17 0434  10/17/17 0245  NA 141   < > 139  K 4.7   < > 4.2  CL 110   < > 110  CO2 25   < > 23  GLUCOSE 132*   < > 134*  BUN 42*   < > 42*  CREATININE 2.43*   < > 2.15*  CALCIUM 8.7*   < > 8.7*  MG 1.7  --   --    < > = values in this interval not displayed.   ------------------------------------------------------------------------------------------------------------------  Cardiac Enzymes No results for input(s): TROPONINI in the last 168 hours. ------------------------------------------------------------------------------------------------------------------  RADIOLOGY:  No results found.   ASSESSMENT AND PLAN:   47 year old male with morbid obesity, poorly controlled diabetes with A1c 15.1 who was admitted to the hospital with worsening left lower extremity infection.  1. MSSA skin and soft tissue infection status post wound VAC Continue Ancef as per ID recommendations Continue wound care with wound VAC as per vascular surgery At the time of discharge he may be able to be discharged on oral Keflex 500 mg 3 times a day for 14 day course. As per my conversation with vascular surgery patient is planned to go back to the OR later this week.   2. Acute hypoxic respiratory failure: Patient has been weaned to room air. Acute hypoxic respiratory failure may be due to atelectasis  3. Lower extremity edema: Echocardiogram shows no evidence of systolic  or diastolic heart failure  Continue IV Lasix for now Monitor intake and output with daily weight Elevate legs if possible   4. Uncontrolled diabetes: Hemoglobin A1c 15.1 Continue  Lantus, NovoLog with sliding scale and ADA diet  5. Acute on chronic anemia: Anemia panel consistent with anemia of chronic disease   6. Essential hypertension: Continue atenolol and I will discontinue Norvasc due to LEE  7. Acute on chronic kidney disease stage III: Creatinine slowly improving This may be patient's new baseline Patient will need outpatient follow-up nephrology upon discharge   Physical therapy is recommending rehabilitation at discharge.    Management plans discussed with the patient and he is in agreement.  CODE STATUS: FULL  TOTAL TIME TAKING CARE OF THIS PATIENT: 24 minutes.     POSSIBLE D/C 2-3 days, DEPENDING ON CLINICAL CONDITION.   Mallori Araque M.D on 10/17/2017 at 9:03 AM  Between 7am to 6pm - Pager - [phone] After 6pm go to [email or web address] - password [email or web address] [place] [facility]  Office  [phone]  CC: Primary care physician; Sallee Lange, NP  Note: This dictation was prepared with Dragon dictation along with smaller phrase technology. Any transcriptional errors that result from this process are unintentional.

## 2017-10-17 NOTE — Progress Notes (Signed)
[name] INFECTIOUS DISEASE PROGRESS NOTE Date of Admission:  10/01/2017     ID: [name] is a 47 y.o. male with MSSA skin and soft tissue infections Principal Problem:   Cellulitis Active Problems:   Diabetes mellitus without complication (HCC)   Swelling of limb   PVD (peripheral vascular disease) (HCC)   Subjective: No fevers, no events. Tolerating ancef  ROS  Eleven systems are reviewed and negative except per hpi  Medications:  Antibiotics Given (last 72 hours)    Date/Time Action Medication Dose Rate   10/14/17 1739 New Bag/Given   ceFAZolin (ANCEF) IVPB 1 g/50 mL premix 1 g 100 mL/hr   10/14/17 2330 New Bag/Given   ceFAZolin (ANCEF) IVPB 1 g/50 mL premix 1 g 100 mL/hr   10/15/17 1610 New Bag/Given   ceFAZolin (ANCEF) IVPB 1 g/50 mL premix 1 g 100 mL/hr   10/15/17 1639 New Bag/Given   ceFAZolin (ANCEF) IVPB 1 g/50 mL premix 1 g 100 mL/hr   10/16/17 0044 New Bag/Given   ceFAZolin (ANCEF) IVPB 1 g/50 mL premix 1 g 100 mL/hr   10/16/17 0815 New Bag/Given   ceFAZolin (ANCEF) IVPB 1 g/50 mL premix 1 g 100 mL/hr   10/16/17 1733 New Bag/Given   ceFAZolin (ANCEF) IVPB 1 g/50 mL premix 1 g 100 mL/hr   10/17/17 0146 New Bag/Given   ceFAZolin (ANCEF) IVPB 1 g/50 mL premix 1 g 100 mL/hr   10/17/17 1118 New Bag/Given   ceFAZolin (ANCEF) IVPB 1 g/50 mL premix 1 g 100 mL/hr     . atenolol  50 mg Oral Daily  . docusate sodium  100 mg Oral BID  . furosemide  40 mg Intravenous Daily  . heparin injection (subcutaneous)  5,000 Units Subcutaneous Q8H  . Influenza vac split quadrivalent PF  0.5 mL Intramuscular Tomorrow-1000  . insulin aspart  0-15 Units Subcutaneous TID WC  . insulin aspart  2 Units Subcutaneous TID WC  . insulin glargine  17 Units Subcutaneous BID  . multivitamin with minerals  1 tablet Oral Daily  . pneumococcal 23 valent vaccine  0.5 mL Intramuscular Tomorrow-1000  . protein supplement shake  11 oz Oral BID BM  . vitamin C  500 mg Oral BID     Objective: Vital signs in last 24 hours: Temp:  [98.3 F (36.8 C)-98.6 F (37 C)] 98.3 F (36.8 C) (02/18 0447) Pulse Rate:  [70-77] 72 (02/18 1004) Resp:  [20] 20 (02/17 1658) BP: (147-163)/(67-77) 160/77 (02/18 1004) SpO2:  [93 %-95 %] 95 % (02/18 0447) Weight:  [190.3 kg (419 lb 9.6 oz)] 190.3 kg (419 lb 9.6 oz) (02/18 0447) Constitutional: He is oriented to person, place, and time. Obese HENT: anicteric Mouth/Throat: Oropharynx is clear and moist. No oropharyngeal exudate.  Cardiovascular: Normal rate, regular rhythm and normal heart sounds.Pulmonary/Chest: Effort normal and breath sounds normal. No respiratory distress. He has no wheezes.  Abdominal: Soft. Bowel sounds are normal. He exhibits no distension. There is no tenderness.  Lymphadenopathy: He has no cervical adenopathy.  Neurological: He is alert and oriented to person, place, and time.  Ext 2 + edema RLE, 3+ LLE  Skin: R upper inner thigh with 1 cm incision site packed with dressing. No drainage note. L leg on wound vac. Area around the vac has thickened desquamating skin Psychiatric: He has a normal mood and affect. His behavior is normal.   Lab Results Recent Labs    10/15/17 0821 10/16/17 0350 10/17/17 0245  WBC 5.8  6.3  --   HGB 8.2* 8.3*  --   HCT 25.0* 25.0*  --   NA 139 139 139  K 4.5 4.2 4.2  CL 111 111 110  CO2 22 23 23   BUN 40* 46* 42*  CREATININE 2.45* 2.15* 2.15*    Microbiology: Results for orders placed or performed during the hospital encounter of 10/01/17  Blood Culture (routine x 2)     Status: None   Collection Time: 10/01/17 10:41 AM  Result Value Ref Range Status   Specimen Description BLOOD LEFT ANTECUBITAL  Final   Special Requests   Final    BOTTLES DRAWN AEROBIC AND ANAEROBIC Blood Culture results may not be optimal due to an excessive volume of blood received in culture bottles   Culture   Final    NO GROWTH 5 DAYS Performed at [facility], [address] [place], [place] [address]    Report Status 10/06/2017 FINAL  Final  Blood Culture (routine x 2)     Status: None   Collection Time: 10/01/17 11:31 AM  Result Value Ref Range Status   Specimen Description BLOOD BLOOD RIGHT ARM  Final   Special Requests   Final    BOTTLES DRAWN AEROBIC AND ANAEROBIC Blood Culture adequate volume   Culture   Final    NO GROWTH 5 DAYS Performed at [facility], [address] [place], [place] [address]    Report Status 10/06/2017 FINAL  Final  MRSA PCR Screening     Status: None   Collection Time: 10/03/17  8:02 AM  Result Value Ref Range Status   MRSA by PCR NEGATIVE NEGATIVE Final    Comment:        The GeneXpert MRSA Assay (FDA approved for NASAL specimens only), is one component of a comprehensive MRSA colonization surveillance program. It is not intended to diagnose MRSA infection nor to guide or monitor treatment for MRSA infections. Performed at [facility], [address], [place], [place] [address]   Aerobic/Anaerobic Culture (surgical/deep wound)     Status: None   Collection Time: 10/07/17  8:25 AM  Result Value Ref Range Status   Specimen Description   Final    WOUND Performed at [facility], [address] [place], [place] [address]    Special Requests   Final    PUS FROM R LEG ABSCESS Performed at [facility], [address] [place], [place] [address]    Gram Stain   Final    MODERATE WBC PRESENT,BOTH PMN AND MONONUCLEAR FEW SQUAMOUS EPITHELIAL CELLS PRESENT FEW GRAM POSITIVE COCCI IN CLUSTERS    Culture   Final    FEW STAPHYLOCOCCUS AUREUS NO ANAEROBES ISOLATED Performed at [facility], [place] [address], [place],  [address]    Report Status 10/12/2017 FINAL  Final   Organism ID, Bacteria STAPHYLOCOCCUS AUREUS  Final      Susceptibility   Staphylococcus aureus - MIC*    CIPROFLOXACIN <=0.5 SENSITIVE Sensitive     ERYTHROMYCIN <=0.25 SENSITIVE Sensitive      GENTAMICIN <=0.5 SENSITIVE Sensitive     OXACILLIN <=0.25 SENSITIVE Sensitive     TETRACYCLINE <=1 SENSITIVE Sensitive     VANCOMYCIN 1 SENSITIVE Sensitive     TRIMETH/SULFA <=10 SENSITIVE Sensitive     CLINDAMYCIN <=0.25 SENSITIVE Sensitive     RIFAMPIN <=0.5 SENSITIVE Sensitive     Inducible Clindamycin NEGATIVE Sensitive     * FEW STAPHYLOCOCCUS AUREUS  Aerobic/Anaerobic  Culture (surgical/deep wound)     Status: None   Collection Time: 10/11/17  6:28 PM  Result Value Ref Range Status   Specimen Description   Final    LEG LEFT Performed at [facility], [address] [place], [place] [address]    Special Requests   Final    NONE Performed at [facility], [address] [place], [place] [address]    Gram Stain   Final    FEW WBC PRESENT, PREDOMINANTLY PMN FEW GRAM POSITIVE COCCI    Culture   Final    FEW STAPHYLOCOCCUS AUREUS NO ANAEROBES ISOLATED Performed at [facility], [place] [address] [place],  [address]    Report Status 10/17/2017 FINAL  Final   Organism ID, Bacteria STAPHYLOCOCCUS AUREUS  Final      Susceptibility   Staphylococcus aureus - MIC*    CIPROFLOXACIN <=0.5 SENSITIVE Sensitive     ERYTHROMYCIN <=0.25 SENSITIVE Sensitive     GENTAMICIN <=0.5 SENSITIVE Sensitive     OXACILLIN <=0.25 SENSITIVE Sensitive     TETRACYCLINE <=1 SENSITIVE Sensitive     VANCOMYCIN <=0.5 SENSITIVE Sensitive     TRIMETH/SULFA <=10 SENSITIVE Sensitive     CLINDAMYCIN <=0.25 SENSITIVE Sensitive     RIFAMPIN <=0.5 SENSITIVE Sensitive     Inducible Clindamycin NEGATIVE Sensitive     * FEW STAPHYLOCOCCUS AUREUS    Studies/Results: No results found.  Assessment/Plan: [name] is a 47 y.o. male with morbid obesity, poorly controlled DM (A1c 15.1) admitted with worsening LLE infection around ankle and found to have MSSA infection. He has also developed infection at a distant site on R thigh and also a new prox medial calf abscess on L.   He denies hx prior abscesses. His HIV negative. He has no fever and wbc down.   He may have had several abscesses brewing at the time of admission (per his report the R thigh one was already starting at time of admit) and they have come to a head now.    2/15- s/p wound vac change with improvement per report. All cxs with MSSA. 2/18- no fevers,  Recommendations Cont ancef while inpatient Continue wound care with wound vac per vascular Advised to elevate the leg If continues to improve with each wound vac change  then at DC would send home on oral keflex 500 mg TID for a 14-21  day course. I should see in clinic prior to stopping his antibiotics. He will need continued control of his LE swelling and chronic stasis changes as well as marked improvement in his otpt management of DM to prevent recurrences  Thank you very much for the consult. Will follow with you.  Leonel Ramsay   10/17/2017, 1:40 PM

## 2017-10-17 NOTE — Progress Notes (Signed)
Nutrition Follow-up  DOCUMENTATION CODES:   Morbid obesity  INTERVENTION:   - Continue Premier Protein po BID, each supplement provides 160 kcal and 30 grams of protein. - Continue daily MVI.  NUTRITION DIAGNOSIS:   Increased nutrient needs(protein, micronutrients) related to wound healing(increased losses from negative pressure wound therapy) as evidenced by estimated needs.  Ongoing  GOAL:   Patient will meet greater than or equal to 90% of their needs  Improving  MONITOR:   PO intake, Supplement acceptance, Labs, Weight trends, Skin, I & O's  REASON FOR ASSESSMENT:   LOS   ASSESSMENT:   47 year old male with PMHx of DM type 2, CKD III, vitamin D deficiency, HTN admitted with left lower extremity cellulitis/necrotic area with severe chronic venous stasis ulcer s/p I&D of necrotic area with wound VAC placement.  10/14/17 - s/p wound vac change with improvement per report  Noted plan for pt to return to OR and continued lower extremity edema.  Spoke with pt briefly at bedside as pt was using bedside commode. RD offered to come back later but pt requested to talk at time of visit. Pt states he has a good appetite and has been enjoying the Premier Protein shakes. Meal completion logged as 90-100% since 10/13/17.  Pt reports no nutrition concerns at this time. RD requested pt notify RN if additional concerns arise and pt would like to talk further with RD.  Medications reviewed and include: 100 mg Colace BID, 40 mg Lasix daily, sliding scale Novolog TID, 2 units Novolog TID, 17 units Lantus BID, MVI with minerals, 500 mg vitamin C BID  Labs reviewed: BUN 42 (H), creatinine 2.15 (H), calcium 8.7 (L) CBG's: 139, 119, 170, 139, 124 since 10/16/17 Hemoglobin A1C 15.1 (H) on 10/01/17  I/O's: -10.5 L since admission  Diet Order:  Diet Carb Modified Fluid consistency: Thin; Room service appropriate? Yes  EDUCATION NEEDS:   Education needs have been addressed  Skin:  Skin  Assessment: Skin Integrity Issues: Skin Integrity Issues:: Other (Comment), Incisions Incisions: closed incision left leg; wound VAC Other: open wounds left leg and right thigh  Last BM:  10/16/17 large type 5  Height:   Ht Readings from Last 1 Encounters:  10/15/17 6\' 4"  (1.93 m)    Weight:   Wt Readings from Last 1 Encounters:  10/17/17 (!) 419 lb 9.6 oz (190.3 kg)    Ideal Body Weight:  91.8 kg  BMI:  Body mass index is 51.08 kg/m.  Estimated Nutritional Needs:   Kcal:  2500-2700  Protein:  120-140 grams (1.2-1.5 grams/kg IBW)  Fluid:  2.2 L/day (25 mL/kg IBW)    Gaynell Face, MS, RD, LDN Pager: [phone] Weekend/After Hours: [phone]

## 2017-10-18 ENCOUNTER — Inpatient Hospital Stay: Payer: BLUE CROSS/BLUE SHIELD | Admitting: Certified Registered"

## 2017-10-18 ENCOUNTER — Encounter: Payer: Self-pay | Admitting: Anesthesiology

## 2017-10-18 ENCOUNTER — Encounter
Admission: EM | Disposition: A | Discharge status: Skilled Nursing Facility | Payer: Self-pay | Source: Home / Self Care | Attending: Internal Medicine

## 2017-10-18 DIAGNOSIS — L02416 Cutaneous abscess of left lower limb: Secondary | ICD-10-CM

## 2017-10-18 DIAGNOSIS — L03116 Cellulitis of left lower limb: Secondary | ICD-10-CM

## 2017-10-18 HISTORY — PX: APPLICATION OF WOUND VAC: SHX5189

## 2017-10-18 LAB — BASIC METABOLIC PANEL
Anion gap: 8 (ref 5–15)
BUN: 40 mg/dL — AB (ref 6–20)
CO2: 23 mmol/L (ref 22–32)
CREATININE: 2.11 mg/dL — AB (ref 0.61–1.24)
Calcium: 8.7 mg/dL — ABNORMAL LOW (ref 8.9–10.3)
Chloride: 108 mmol/L (ref 101–111)
GFR calc Af Amer: 41 mL/min — ABNORMAL LOW (ref >60)
GFR, EST NON AFRICAN AMERICAN: 35 mL/min — AB (ref >60)
GLUCOSE: 95 mg/dL (ref 65–99)
POTASSIUM: 3.8 mmol/L (ref 3.5–5.1)
SODIUM: 139 mmol/L (ref 135–145)

## 2017-10-18 LAB — GLUCOSE, CAPILLARY
GLUCOSE-CAPILLARY: 71 mg/dL (ref 65–99)
GLUCOSE-CAPILLARY: 69 mg/dL (ref 65–99)
GLUCOSE-CAPILLARY: 81 mg/dL (ref 65–99)
GLUCOSE-CAPILLARY: 113 mg/dL — AB (ref 65–99)
Glucose-Capillary: 70 mg/dL (ref 65–99)
Glucose-Capillary: 73 mg/dL (ref 65–99)

## 2017-10-18 SURGERY — APPLICATION, WOUND VAC
Anesthesia: General | Laterality: Left

## 2017-10-18 MED ORDER — ONDANSETRON HCL 4 MG/2ML IJ SOLN: 4.0000 mg | Freq: Once | INTRAMUSCULAR | Status: DC | PRN

## 2017-10-18 MED ORDER — DEXTROSE-NACL 5-0.45 % IV SOLN
INTRAVENOUS | Status: DC
  Administered 2017-10-18: 09:00:00 via INTRAVENOUS

## 2017-10-18 MED ORDER — HYDRALAZINE HCL 20 MG/ML IJ SOLN: 10.0000 mg | Freq: Four times a day (QID) | INTRAMUSCULAR | Status: DC | PRN

## 2017-10-18 MED ORDER — ATENOLOL 100 MG PO TABS
100.0000 mg | ORAL_TABLET | Freq: Every day | ORAL | Status: DC
  Administered 2017-10-18 – 2017-10-19 (×2): 100 mg via ORAL
  Filled 2017-10-18: qty 4
  Filled 2017-10-18 (×2): qty 1
  Filled 2017-10-18: qty 4

## 2017-10-18 MED ORDER — FENTANYL CITRATE (PF) 100 MCG/2ML IJ SOLN
25.0000 ug | INTRAMUSCULAR | Status: DC | PRN
  Administered 2017-10-18 (×2): 25 ug via INTRAVENOUS

## 2017-10-18 MED ORDER — MIDAZOLAM HCL 2 MG/2ML IJ SOLN
INTRAMUSCULAR | Status: AC
  Filled 2017-10-18: qty 2

## 2017-10-18 MED ORDER — SODIUM CHLORIDE 0.9 % IV SOLN
INTRAVENOUS | Status: DC | PRN
  Administered 2017-10-18: 3 g via INTRAVENOUS

## 2017-10-18 MED ORDER — FENTANYL CITRATE (PF) 100 MCG/2ML IJ SOLN
INTRAMUSCULAR | Status: AC
  Administered 2017-10-18: 25 ug via INTRAVENOUS
  Filled 2017-10-18: qty 2

## 2017-10-18 MED ORDER — MIDAZOLAM HCL 2 MG/2ML IJ SOLN
INTRAMUSCULAR | Status: DC | PRN
  Administered 2017-10-18: 2 mg via INTRAVENOUS

## 2017-10-18 MED ORDER — PROPOFOL 10 MG/ML IV BOLUS
INTRAVENOUS | Status: DC | PRN
  Administered 2017-10-18 (×2): 50 mg via INTRAVENOUS

## 2017-10-18 MED ORDER — ONDANSETRON HCL 4 MG/2ML IJ SOLN
INTRAMUSCULAR | Status: AC
  Filled 2017-10-18: qty 2

## 2017-10-18 MED ORDER — FUROSEMIDE 10 MG/ML IJ SOLN
40.0000 mg | Freq: Two times a day (BID) | INTRAMUSCULAR | Status: DC
  Administered 2017-10-18 – 2017-10-19 (×2): 40 mg via INTRAVENOUS
  Filled 2017-10-18 (×2): qty 4

## 2017-10-18 MED ORDER — FENTANYL CITRATE (PF) 100 MCG/2ML IJ SOLN
INTRAMUSCULAR | Status: DC | PRN
  Administered 2017-10-18: 50 ug via INTRAVENOUS
  Administered 2017-10-18: 25 ug via INTRAVENOUS

## 2017-10-18 MED ORDER — FENTANYL CITRATE (PF) 100 MCG/2ML IJ SOLN
INTRAMUSCULAR | Status: AC
  Filled 2017-10-18: qty 2

## 2017-10-18 MED ORDER — SUCCINYLCHOLINE CHLORIDE 20 MG/ML IJ SOLN
INTRAMUSCULAR | Status: AC
  Filled 2017-10-18: qty 1

## 2017-10-18 MED ORDER — PROPOFOL 10 MG/ML IV BOLUS
INTRAVENOUS | Status: AC
  Filled 2017-10-18: qty 40

## 2017-10-18 MED ORDER — LIDOCAINE HCL (CARDIAC) 20 MG/ML IV SOLN
INTRAVENOUS | Status: DC | PRN
  Administered 2017-10-18: 50 mg via INTRAVENOUS

## 2017-10-18 MED ORDER — LIDOCAINE HCL (PF) 2 % IJ SOLN
INTRAMUSCULAR | Status: AC
  Filled 2017-10-18: qty 10

## 2017-10-18 SURGICAL SUPPLY — 25 items
BNDG COHESIVE 6X5 TAN STRL LF (GAUZE/BANDAGES/DRESSINGS) IMPLANT
CANISTER SUCT 1200ML W/VALVE (MISCELLANEOUS) IMPLANT
CHLORAPREP W/TINT 26ML (MISCELLANEOUS) IMPLANT
DRSG EMULSION OIL 3X3 NADH (GAUZE/BANDAGES/DRESSINGS) IMPLANT
DRSG MEPITEL 4X7.2 (GAUZE/BANDAGES/DRESSINGS) ×3 IMPLANT
DRSG VAC ATS MED SENSATRAC (GAUZE/BANDAGES/DRESSINGS) ×3 IMPLANT
ELECT REM PT RETURN 9FT ADLT (ELECTROSURGICAL)
ELECTRODE REM PT RTRN 9FT ADLT (ELECTROSURGICAL) IMPLANT
GAUZE PACKING IODOFORM 1/2 (PACKING) ×3 IMPLANT
GAUZE SPONGE 4X4 12PLY STRL (GAUZE/BANDAGES/DRESSINGS) IMPLANT
GAUZE STRETCH 2X75IN STRL (MISCELLANEOUS) IMPLANT
GLOVE SURG SYN 8.0 (GLOVE) ×3 IMPLANT
GOWN STRL REUS W/ TWL LRG LVL3 (GOWN DISPOSABLE) ×1 IMPLANT
GOWN STRL REUS W/ TWL XL LVL3 (GOWN DISPOSABLE) ×1 IMPLANT
GOWN STRL REUS W/TWL LRG LVL3 (GOWN DISPOSABLE) ×2
GOWN STRL REUS W/TWL XL LVL3 (GOWN DISPOSABLE) ×2
KIT TURNOVER KIT A (KITS) IMPLANT
LABEL OR SOLS (LABEL) IMPLANT
NS IRRIG 500ML POUR BTL (IV SOLUTION) IMPLANT
PACK EXTREMITY ARMC (MISCELLANEOUS) IMPLANT
PAD PREP 24X41 OB/GYN DISP (PERSONAL CARE ITEMS) IMPLANT
SOL PREP PVP 2OZ (MISCELLANEOUS)
SOLUTION PREP PVP 2OZ (MISCELLANEOUS) IMPLANT
STOCKINETTE IMPERV 14X48 (MISCELLANEOUS) IMPLANT
WND VAC CANISTER 500ML (MISCELLANEOUS) ×3 IMPLANT

## 2017-10-18 NOTE — Progress Notes (Signed)
Pt rested well this shift . MD notified this shift of elevated BP. New orders received for PRn hydralizine . Pt remains NPO AFTER MIDNIGHT

## 2017-10-18 NOTE — Anesthesia Post-op Follow-up Note (Signed)
Anesthesia QCDR form completed.        

## 2017-10-18 NOTE — Progress Notes (Incomplete)
Verabl

## 2017-10-18 NOTE — Op Note (Signed)
    OPERATIVE NOTE   PROCEDURE: VAC dressing change left leg both the proximal medial calf wound and the lateral posterior ankle wound  PRE-OPERATIVE DIAGNOSIS: Cellulitis left lower extremity with abscess formation in multiple locations  POST-OPERATIVE DIAGNOSIS: Same  SURGEON: [name]  ASSISTANT(S): None  ANESTHESIA: IV sedation  ESTIMATED BLOOD LOSS: 0 cc  FINDING(S): The ankle wound is richly granulated and continues to fill in with respect to the undermining.  SPECIMEN(S): None  INDICATIONS:   [name] is a 47 y.o. male who presents with multiple abscesses of the left leg which is undergone repeated debridement and are now being treated with a VAC wound care plan.  The patient is returning to the operating room for a dressing change this time using a small amount of sedation to gauge whether he is ready to have his VAC changes done at the bedside.  Risks and benefits were reviewed patient is agreed to proceed.  DESCRIPTION: After full informed written consent was obtained from the patient, the patient was brought back to the operating room and placed supine upon the operating table.  Prior to induction, the patient received IV antibiotics.   After obtaining adequate anesthesia, the patient was positioned for a VAC dressing change.  Previous dressing was removed first from the ankle and then later from the medial proximal calf.  Wound is inspected is found to be quite clean at the ankle the undermining continues to fill in and is almost resolved.  Base of the wound is richly granulated.   Mepitel was then placed in the bed of the wound and a black VAC sponge cut to size and covered with the plastic dressing.  A tail is then run up the shin toward the second abscess.  The patient is then repositioned slightly and to the proximal medial calf dressing is removed.  The cavity is clean it is granulating there is no further purulent material it still measures  approximately 6 cm in depth.  Sponge is cut to shape wrapped in Mepitel and then inserted into the wound.  It too is covered with a plastic dressing and a bridge is then created between the 2 ulcers.  Negative pressure is then initiated with a good seal.   The patient tolerated this procedure well.   COMPLICATIONS: None  CONDITION: [name]  [facility]  Office: [phone]   10/18/2017, 3:07 PM

## 2017-10-18 NOTE — Progress Notes (Signed)
Pt arrived back to room 138 from PACU. VSS. Pt alert and oriented X4. No complaints of pain. Wound vac and dressing in place. Family at the bedside. Bed in lowest position call bell in reach and bed alarm on.

## 2017-10-18 NOTE — Progress Notes (Signed)
Per MD Mody atenolol was changed from 50 mg to 100 mg. Ok to give now.

## 2017-10-18 NOTE — Progress Notes (Signed)
Per [facility] admissions coordinator at [facility] authorization has been received. Per Claiborne Billings they also have a wound vac for patient. Per MD patient will be stable for D/C tomorrow.   [employer], LCSW [phone]

## 2017-10-18 NOTE — Progress Notes (Signed)
PT Cancellation Note  Patient Details Name: [name] MRN: [id number] DOB: 06-13-1971   Cancelled Treatment:    Reason Eval/Treat Not Completed: Other (comment)   Pt resting in bed.  NPO for wound vac change later today.  Pt encouraged by declined session this am. Stated he was waiting for procedure and was not able to eat breakfast.  Concerned over his lower blood sugar levels.  "I don't think it would be a good idea right now".  Will continue as appropriate.   [name] 10/18/2017, 9:17 AM

## 2017-10-18 NOTE — Progress Notes (Signed)
[name] at [place] NAME: [name]    MR#:  [id number]  DATE OF BIRTH:  Aug 31, 1970  SUBJECTIVE:   Patient is tired of being in the hospital. He did not sleep well.   REVIEW OF SYSTEMS:    Review of Systems  Constitutional: Negative for fever, chills weight loss HENT: Negative for ear pain, nosebleeds, congestion, facial swelling, rhinorrhea, neck pain, neck stiffness and ear discharge.   Respiratory: Negative for cough, shortness of breath, wheezing  Cardiovascular: Negative for chest pain, palpitations and leg swelling.  Gastrointestinal: Negative for heartburn, abdominal pain, vomiting, diarrhea or consitpation Genitourinary: Negative for dysuria, urgency, frequency, hematuria Musculoskeletal: Negative for back pain or joint pain Neurological: Negative for dizziness, seizures, syncope, focal weakness,  numbness and headaches.  Hematological: Does not bruise/bleed easily.  Psychiatric/Behavioral: Negative for hallucinations, confusion, dysphoric mood SKIN: wound vac placed LLE   Tolerating Diet: Nothing by mouth      DRUG ALLERGIES:   Allergies  Allergen Reactions  . Lisinopril Swelling    VITALS:  Blood pressure (!) 150/74, pulse 65, temperature 98.4 F (36.9 C), temperature source Oral, resp. rate 18, height 6\' 4"  (1.93 m), weight (!) 185.8 kg (409 lb 9.8 oz), SpO2 94 %.  PHYSICAL EXAMINATION:  Constitutional: Appears well-developed and well-nourished. No distress. HENT: Normocephalic. [name] Oropharynx is clear and moist.  Eyes: Conjunctivae and EOM are normal. PERRLA, no scleral icterus.  Neck: Normal ROM. Neck supple. No JVD. No tracheal deviation. CVS: RRR, S1/S2 +, 2/6 [name], no gallops, no carotid bruit.  Pulmonary: Effort and breath sounds normal, no stridor, rhonchi, wheezes, rales.  Abdominal: Soft. BS +,  no distension, tenderness, rebound or guarding.  Musculoskeletal:no issues 2+ LEE Neuro: Alert. CN 2-12  grossly intact. No focal deficits. Skin: wound vac LLE  Psychiatric: Normal mood and affect.      LABORATORY PANEL:   CBC Recent Labs  Lab 10/16/17 0350  WBC 6.3  HGB 8.3*  HCT 25.0*  PLT 334   ------------------------------------------------------------------------------------------------------------------  Chemistries  Recent Labs  Lab 10/14/17 0434  10/18/17 0415  NA 141   < > 139  K 4.7   < > 3.8  CL 110   < > 108  CO2 25   < > 23  GLUCOSE 132*   < > 95  BUN 42*   < > 40*  CREATININE 2.43*   < > 2.11*  CALCIUM 8.7*   < > 8.7*  MG 1.7  --   --    < > = values in this interval not displayed.   ------------------------------------------------------------------------------------------------------------------  Cardiac Enzymes No results for input(s): TROPONINI in the last 168 hours. ------------------------------------------------------------------------------------------------------------------  RADIOLOGY:  No results found.   ASSESSMENT AND PLAN:   46 year old male with morbid obesity, poorly controlled diabetes with A1c 15.1 who was admitted to the hospital with worsening left lower extremity infection.  1. MSSA skin and soft tissue infection status post wound VAC He will Continue Ancef as per ID recommendations Continue wound care with wound VAC as per vascular surgery At the time of discharge he will be transitioned to oral Keflex 500 mg 3 times a day for 14 day course. Plan for wound VAC change today.   2. Acute hypoxic respiratory failure: Patient has been weaned to room air. Acute hypoxic respiratory failure was  due to atelectasis  3. Lower extremity edema: Echocardiogram shows no evidence of systolic or diastolic heart failure   continue IV Lasix  twice a day Wound care consultation for any bruits  Monitor intake and output with daily weight Elevate legs  Norvasc discontinued as this can cause lower extremity edema  4. Uncontrolled diabetes:  Hemoglobin A1c 15.1 Continue Lantus, NovoLog with sliding scale and ADA diet  5. Acute on chronic anemia: Anemia panel consistent with anemia of chronic disease   6. Essential hypertension: I have increased her atenolol for better blood pressure control   7. Acute on chronic kidney disease stage III: Creatinine is stable  This  Is patient's new baseline Patient will need outpatient follow-up nephrology upon discharge   Physical therapy is recommending rehabilitation at discharge.    Management plans discussed with the patient and he is in agreement.  CODE STATUS: FULL  TOTAL TIME TAKING CARE OF THIS PATIENT: 24 minutes.     POSSIBLE D/C tomorrow, DEPENDING ON CLINICAL CONDITION.   Nathian Stencil M.D on 10/18/2017 at 8:05 AM  Between 7am to 6pm - Pager - [phone] After 6pm go to [email or web address] - password [email or web address] [facility]  Office  [phone]  CC: Primary care physician; Sallee Lange, NP  Note: This dictation was prepared with Dragon dictation along with smaller phrase technology. Any transcriptional errors that result from this process are unintentional.

## 2017-10-18 NOTE — Anesthesia Preprocedure Evaluation (Addendum)
Anesthesia Evaluation  Patient identified by MRN, date of birth, ID band Patient awake    Reviewed: Allergy & Precautions, NPO status , Patient's Chart, lab work & pertinent test results  History of Anesthesia Complications Negative for: history of anesthetic complications  Airway Mallampati: II  TM Distance: >3 FB Neck ROM: Full    Dental  (+) Partial Upper   Pulmonary neg pulmonary ROS, neg sleep apnea, neg COPD,    breath sounds clear to auscultation- rhonchi (-) wheezing      Cardiovascular hypertension, Pt. on medications + Peripheral Vascular Disease  (-) CAD, (-) Past MI, (-) Cardiac Stents and (-) CABG  Rhythm:Regular Rate:Normal - Systolic murmurs and - Diastolic murmurs    Neuro/Psych negative neurological ROS  negative psych ROS   GI/Hepatic negative GI ROS, Neg liver ROS,   Endo/Other  diabetes, Insulin Dependent  Renal/GU Renal InsufficiencyRenal disease     Musculoskeletal negative musculoskeletal ROS (+)   Abdominal (+) + obese,   Peds  Hematology negative hematology ROS (+)   Anesthesia Other Findings Past Medical History: No date: Diabetes mellitus without complication (HCC) No date: Hypertension No date: Microalbuminuria No date: Vitamin D deficiency   Reproductive/Obstetrics                             Anesthesia Physical  Anesthesia Plan  ASA: III  Anesthesia Plan: General   Post-op Pain Management:    Induction: Intravenous  PONV Risk Score and Plan: 1 and Dexamethasone, Ondansetron and TIVA  Airway Management Planned: Nasal Cannula  Additional Equipment:   Intra-op Plan:   Post-operative Plan:   Informed Consent: I have reviewed the patients History and Physical, chart, labs and discussed the procedure including the risks, benefits and alternatives for the proposed anesthesia with the patient or authorized representative who has indicated his/her  understanding and acceptance.   Dental advisory given  Plan Discussed with: CRNA and Anesthesiologist  Anesthesia Plan Comments:        Anesthesia Quick Evaluation

## 2017-10-18 NOTE — Anesthesia Procedure Notes (Signed)
Performed by: Lance Muss, CRNA Pre-anesthesia Checklist: Patient identified, Emergency Drugs available, Suction available, Patient being monitored and Timeout performed Patient Re-evaluated:Patient Re-evaluated prior to induction Oxygen Delivery Method: Simple face mask

## 2017-10-18 NOTE — Progress Notes (Signed)
While rounding chaplain visited with Pt. Therapist was in the room. Ch checked on Pt and said he would be back   10/18/17 1000  Clinical Encounter Type  Visited With Patient  Visit Type Follow-up  Referral From [place] Needs Emotional

## 2017-10-18 NOTE — Transfer of Care (Signed)
Immediate Anesthesia Transfer of Care Note  Patient: [name]  Procedure(s) Performed: APPLICATION OF WOUND VAC (Left ) DEBRIDEMENT WOUND (Left )  Patient Location: PACU  Anesthesia Type:MAC  Level of Consciousness: awake and alert   Airway & Oxygen Therapy: Patient Spontanous Breathing and Patient connected to face mask oxygen  Post-op Assessment: Report given to RN and Post -op Vital signs reviewed and stable  Post vital signs: Reviewed and stable  Last Vitals:  Vitals:   10/18/17 1335 10/18/17 1503  BP: (!) 160/82 121/61  Pulse: 60 63  Resp: 17 18  Temp: (!) 36.4 C   SpO2: 98% 100%    Last Pain:  Vitals:   10/18/17 1335  TempSrc: Tympanic  PainSc: 1       Patients Stated Pain Goal: 2 (15/40/08 6761)  Complications: No apparent anesthesia complications

## 2017-10-18 NOTE — Anesthesia Postprocedure Evaluation (Signed)
Anesthesia Post Note  Patient: [name]  Procedure(s) Performed: WOUND VAC CHANGE (Left )  Patient location during evaluation: PACU Anesthesia Type: General Level of consciousness: awake and alert and oriented Pain management: pain level controlled Vital Signs Assessment: post-procedure vital signs reviewed and stable Respiratory status: spontaneous breathing Cardiovascular status: blood pressure returned to baseline Anesthetic complications: no     Last Vitals:  Vitals:   10/18/17 1549 10/18/17 1618  BP: (!) 141/70 (!) 149/80  Pulse: (!) 58 60  Resp: 19 18  Temp: 36.7 C 37.1 C  SpO2: 96% 94%    Last Pain:  Vitals:   10/18/17 1618  TempSrc: Oral  PainSc: 0-No pain                 Amond Speranza

## 2017-10-18 NOTE — Progress Notes (Signed)
Pharmacy Antibiotic Note  [name] is a 47 y.o. male admitted on 10/01/2017 with LLE swelling and drainage s/p I and D.  Pharmacy has been consulted for Unasyn dosing transition from Zosyn per ID. ID further narrowing Unasyn to cefazolin.  MSSA recovered from 2/8 wound cx and 2/12 cx growing Staph.  Plan: Continue cefazolin 1 g iv q 8 hours with plans to transition to cephalexin 500 mg tid for 14-21 days of antibiotics per ID.   Height: 6\' 4"  (193 cm) Weight: (!) 409 lb 9.8 oz (185.8 kg) IBW/kg (Calculated) : 86.8  Temp (24hrs), Avg:98.4 F (36.9 C), Min:98.2 F (36.8 C), Max:98.6 F (37 C)  Recent Labs  Lab 10/12/17 0412  10/14/17 0434 10/15/17 0821 10/16/17 0350 10/17/17 0245 10/18/17 0415  WBC 6.9  --  7.9 5.8 6.3  --   --   CREATININE 2.71*   < > 2.43* 2.45* 2.15* 2.15* 2.11*   < > = values in this interval not displayed.    Estimated Creatinine Clearance: 76.5 mL/min (A) (by C-G formula based on SCr of 2.11 mg/dL (H)).    Allergies  Allergen Reactions  . Lisinopril Swelling    Antimicrobials this admission: vancomycin 2/2 >> 2/12 Zosyn 2/2 >> 2/14 Unasyn 2/14 >> 2/15 Cefazolin 2/15 >> Dose adjustments this admission:   Microbiology results: 2/2 BCx: NG 2/4 MRSA PCR: negative 2/12 Wound Cx: MSSA 2/8 Wound Cx: MSSA  Thank you for allowing pharmacy to be a part of this patient's care.  Ulice Dash D 10/18/2017 9:59 AM

## 2017-10-18 NOTE — Progress Notes (Signed)
Ancef not on unit. Pharmacy called and tech stated med is preparing to go on delivery. Writer requests Ancef to be tubed as dose was due at 1905.

## 2017-10-19 ENCOUNTER — Encounter: Payer: Self-pay | Admitting: Vascular Surgery

## 2017-10-19 LAB — CBC
HEMATOCRIT: 25.6 % — AB (ref 40.0–52.0)
HEMOGLOBIN: 8.4 g/dL — AB (ref 13.0–18.0)
MCH: 29.1 pg (ref 26.0–34.0)
MCHC: 32.7 g/dL (ref 32.0–36.0)
MCV: 89.1 fL (ref 80.0–100.0)
Platelets: 320 10*3/uL (ref 150–440)
RBC: 2.88 MIL/uL — ABNORMAL LOW (ref 4.40–5.90)
RDW: 15.4 % — ABNORMAL HIGH (ref 11.5–14.5)
WBC: 5.9 10*3/uL (ref 3.8–10.6)

## 2017-10-19 LAB — BASIC METABOLIC PANEL
Anion gap: 8 (ref 5–15)
BUN: 37 mg/dL — ABNORMAL HIGH (ref 6–20)
CHLORIDE: 107 mmol/L (ref 101–111)
CO2: 24 mmol/L (ref 22–32)
CREATININE: 2.11 mg/dL — AB (ref 0.61–1.24)
Calcium: 8.6 mg/dL — ABNORMAL LOW (ref 8.9–10.3)
GFR calc non Af Amer: 35 mL/min — ABNORMAL LOW (ref >60)
GFR, EST AFRICAN AMERICAN: 41 mL/min — AB (ref >60)
GLUCOSE: 109 mg/dL — AB (ref 65–99)
Potassium: 4.2 mmol/L (ref 3.5–5.1)
Sodium: 139 mmol/L (ref 135–145)

## 2017-10-19 LAB — GLUCOSE, CAPILLARY
GLUCOSE-CAPILLARY: 154 mg/dL — AB (ref 65–99)
Glucose-Capillary: 116 mg/dL — ABNORMAL HIGH (ref 65–99)
Glucose-Capillary: 144 mg/dL — ABNORMAL HIGH (ref 65–99)

## 2017-10-19 MED ORDER — CEPHALEXIN 500 MG PO CAPS: 500.0000 mg | ORAL_CAPSULE | Freq: Three times a day (TID) | ORAL | 0 refills | Status: AC

## 2017-10-19 MED ORDER — ATENOLOL 100 MG PO TABS: 100.0000 mg | ORAL_TABLET | Freq: Every day | ORAL | 0 refills | Status: DC

## 2017-10-19 MED ORDER — INSULIN GLARGINE 100 UNIT/ML ~~LOC~~ SOLN: 17.0000 [IU] | Freq: Two times a day (BID) | SUBCUTANEOUS | 0 refills | Status: DC

## 2017-10-19 MED ORDER — FUROSEMIDE 40 MG PO TABS: 40.0000 mg | ORAL_TABLET | Freq: Two times a day (BID) | ORAL | 0 refills | Status: DC

## 2017-10-19 NOTE — Clinical Social Work Placement (Signed)
9:12 AM  Patient is medically stable for discharge to SNF today. Spoke with facility who is agreeable to admission today and insurance authorization has been received. Patient will transport by EMS  Patient is aware and agreeable with plan. Patient to notify his mother.  DC today. Will discuss with RN.  [facility]. CLINICAL SOCIAL WORK PLACEMENT  NOTE  Date:  10/19/2017  Patient Details  Name: [name] MRN: [id number] Date of Birth: 1971-07-22  Clinical Social Work is seeking post-discharge placement for this patient at the [place] level of care (*CSW will initial, date and re-position this form in  chart as items are completed):  Yes   Patient/family provided with [place] Work Department's list of facilities offering this level of care within the geographic area requested by the patient (or if unable, by the patient's family).  Yes   Patient/family informed of their freedom to choose among providers that offer the needed level of care, that participate in Medicare, Medicaid or managed care program needed by the patient, have an available bed and are willing to accept the patient.  Yes   Patient/family informed of [facility]'s ownership interest in [facility] and [facility], as well as of the fact that they are under no obligation to receive care at these facilities.  PASRR submitted to EDS on 10/04/17     PASRR number received on 10/04/17     Existing PASRR number confirmed on       FL2 transmitted to all facilities in geographic area requested by pt/family on 10/04/17     FL2 transmitted to all facilities within larger geographic area on       Patient informed that his/her managed care company has contracts with or will negotiate with certain facilities, including the following:        Yes   Patient/family informed of bed offers received.  Patient chooses bed at [facility]     Physician  recommends and patient chooses bed at [facility]    Patient to be transferred to [facility] on 10/19/17.  Patient to be transferred to facility by EMS     Patient family notified on 10/19/17 of transfer.  Name of family member notified:  will update mother      PHYSICIAN Please sign FL2     Additional Comment:    _______________________________________________ Lilly [place], LCSW 10/19/2017, 9:11 AM

## 2017-10-19 NOTE — Progress Notes (Signed)
Wound vac clamped and disconnected.  Report given to EMS prior to transport.  Patient A&O, VSS.

## 2017-10-19 NOTE — Discharge Summary (Addendum)
[place] [facility] [place] NAME: [name]    MR#:  [id number]  DATE OF BIRTH:  July 24, 1971  DATE OF ADMISSION:  10/01/2017 ADMITTING PHYSICIAN: [name], MD  DATE OF DISCHARGE: 10/19/2017  PRIMARY CARE PHYSICIAN: Sallee Lange, NP    ADMISSION DIAGNOSIS:  Abscess [L02.91] Hyperglycemia [R73.9] Sepsis, due to unspecified organism [facility]) [A41.9] Acute renal failure, unspecified acute renal failure type ([place]) [N17.9] Swelling of lower leg [M79.89]  DISCHARGE DIAGNOSIS:  Principal Problem:   Cellulitis Active Problems:   Diabetes mellitus without complication ([place])   Swelling of limb   PVD (peripheral vascular disease) ([place])   SECONDARY DIAGNOSIS:   Past Medical History:  Diagnosis Date  . Diabetes mellitus without complication ([place])   . Hypertension   . Microalbuminuria   . Vitamin D deficiency     HOSPITAL COURSE:    47 year old male with morbid obesity, poorly controlled diabetes with A1c 15.1 who was admitted to the hospital with worsening left lower extremity infection.  1. MSSA skin and soft tissue infection status post wound VAC Patient was on IV antibiotics as recommended by infectious disease consultant while he was in the hospital. At the time of discharge he will be transitioned to oral Keflex 500 mg 3 times a day for 14 day course. He need wound VAC changed weekly. His last wound VAC change was yesterday for. 19th. He will follow up with faster surgery in 1 week. He will also need follow-up with Dr. Ola Spurr in 2 weeks.   2. Acute hypoxic respiratory failure: Patient has been weaned to room air. Acute hypoxic respiratory failure was  due to atelectasis  3. Lower extremity edema: Echocardiogram shows no evidence of systolic or diastolic heart failure  He will continue oral Lasix Norvasc was discontinued as this can cause lower extremity edema  4. Uncontrolled diabetes: Hemoglobin A1c 15.1 He will  continue Continue Lantus and ADA diet  5. Acute on chronic anemia: Anemia panel consistent with anemia of chronic disease   6. Essential hypertension: I have increased her atenolol for better blood pressure control   7. Acute on chronic kidney disease stage III: Creatinine is stable  This  Is patient's new baseline Patient will need outpatient follow-up nephrology upon discharge   Physical therapy is recommending rehabilitation at discharge.    DISCHARGE CONDITIONS AND DIET:   Stable for discharge on diabetic heart healthy diet  CONSULTS OBTAINED:  Treatment Team:  Katha Cabal, MD Leonel Ramsay, MD  DRUG ALLERGIES:   Allergies  Allergen Reactions  . Lisinopril Swelling    DISCHARGE MEDICATIONS:   Allergies as of 10/19/2017      Reactions   Lisinopril Swelling      Medication List    STOP taking these medications   amLODipine 10 MG tablet Commonly known as:  NORVASC   hydrochlorothiazide 25 MG tablet Commonly known as:  HYDRODIURIL   JARDIANCE 25 MG Tabs tablet Generic drug:  empagliflozin   LANTUS SOLOSTAR 100 UNIT/ML Solostar Pen Generic drug:  Insulin Glargine Replaced by:  insulin glargine 100 UNIT/ML injection     TAKE these medications   atenolol 100 MG tablet Commonly known as:  TENORMIN Take 1 tablet (100 mg total) by mouth daily. What changed:    medication strength  how much to take   cephALEXin 500 MG capsule Commonly known as:  KEFLEX Take 1 capsule (500 mg total) by mouth 3 (three) times daily for 14 days.  furosemide 40 MG tablet Commonly known as:  LASIX Take 1 tablet (40 mg total) by mouth 2 (two) times daily.   gentamicin cream 0.1 % Commonly known as:  GARAMYCIN Apply 1 application topically 3 (three) times daily.   insulin glargine 100 UNIT/ML injection Commonly known as:  LANTUS Inject 0.17 mLs (17 Units total) into the skin 2 (two) times daily. Replaces:  LANTUS SOLOSTAR 100 UNIT/ML Solostar  Pen   metFORMIN 500 MG 24 hr tablet Commonly known as:  GLUCOPHAGE-XR TAKE 2 TABLETS (1,000 MG TOTAL) BY MOUTH ONCE DAILY.(NEED APPT FOR FURTHER REFILLS)            Discharge Care Instructions  (From admission, onward)        Start     Ordered   10/19/17 0000  Discharge wound care:    Comments:  Wound vac change weekly   10/19/17 0906        Today   CHIEF COMPLAINT:   Patient doing well this morning. No acute issues   VITAL SIGNS:  Blood pressure (!) 144/74, pulse 68, temperature 98.7 F (37.1 C), temperature source Oral, resp. rate 19, height 6\' 4"  (1.93 m), weight (!) 185.5 kg (409 lb), SpO2 98 %.   REVIEW OF SYSTEMS:  Review of Systems  Constitutional: Negative.  Negative for chills, fever and malaise/fatigue.  HENT: Negative.  Negative for ear discharge, ear pain, hearing loss, nosebleeds and sore throat.   Eyes: Negative.  Negative for blurred vision and pain.  Respiratory: Negative.  Negative for cough, hemoptysis, shortness of breath and wheezing.   Cardiovascular: Negative.  Negative for chest pain, palpitations and leg swelling.  Gastrointestinal: Negative.  Negative for abdominal pain, blood in stool, diarrhea, nausea and vomiting.  Genitourinary: Negative.  Negative for dysuria.  Musculoskeletal: Negative.  Negative for back pain.  Skin:       Wound vac placed  Neurological: Negative for dizziness, tremors, speech change, focal weakness, seizures and headaches.  Endo/Heme/Allergies: Negative.  Does not bruise/bleed easily.  Psychiatric/Behavioral: Negative.  Negative for depression, hallucinations and suicidal ideas.     PHYSICAL EXAMINATION:  GENERAL:  47 y.o.-year-old patient lying in the bed with no acute distress.  NECK:  Supple, no jugular venous distention. No thyroid enlargement, no tenderness.  LUNGS: Normal breath sounds bilaterally, no wheezing, rales,rhonchi  No use of accessory muscles of respiration.  CARDIOVASCULAR: S1, S2 normal.  No murmurs, rubs, or gallops.  ABDOMEN: Soft, non-tender, non-distended. Bowel sounds present. No organomegaly or mass.  EXTREMITIES: 2+ pedal edema, no cyanosis, or clubbing.  PSYCHIATRIC: The patient is alert and oriented x 3.  SKIN: Wound VAC placed  DATA REVIEW:   CBC Recent Labs  Lab 10/19/17 0311  WBC 5.9  HGB 8.4*  HCT 25.6*  PLT 320    Chemistries  Recent Labs  Lab 10/14/17 0434  10/19/17 0311  NA 141   < > 139  K 4.7   < > 4.2  CL 110   < > 107  CO2 25   < > 24  GLUCOSE 132*   < > 109*  BUN 42*   < > 37*  CREATININE 2.43*   < > 2.11*  CALCIUM 8.7*   < > 8.6*  MG 1.7  --   --    < > = values in this interval not displayed.    Cardiac Enzymes No results for input(s): TROPONINI in the last 168 hours.  Microbiology Results  @MICRORSLT48 @  RADIOLOGY:  No results  found.    Allergies as of 10/19/2017      Reactions   Lisinopril Swelling      Medication List    STOP taking these medications   amLODipine 10 MG tablet Commonly known as:  NORVASC   hydrochlorothiazide 25 MG tablet Commonly known as:  HYDRODIURIL   JARDIANCE 25 MG Tabs tablet Generic drug:  empagliflozin   LANTUS SOLOSTAR 100 UNIT/ML Solostar Pen Generic drug:  Insulin Glargine Replaced by:  insulin glargine 100 UNIT/ML injection     TAKE these medications   atenolol 100 MG tablet Commonly known as:  TENORMIN Take 1 tablet (100 mg total) by mouth daily. What changed:    medication strength  how much to take   cephALEXin 500 MG capsule Commonly known as:  KEFLEX Take 1 capsule (500 mg total) by mouth 3 (three) times daily for 14 days.   furosemide 40 MG tablet Commonly known as:  LASIX Take 1 tablet (40 mg total) by mouth 2 (two) times daily.   gentamicin cream 0.1 % Commonly known as:  GARAMYCIN Apply 1 application topically 3 (three) times daily.   insulin glargine 100 UNIT/ML injection Commonly known as:  LANTUS Inject 0.17 mLs (17 Units total) into the skin 2  (two) times daily. Replaces:  LANTUS SOLOSTAR 100 UNIT/ML Solostar Pen   metFORMIN 500 MG 24 hr tablet Commonly known as:  GLUCOPHAGE-XR TAKE 2 TABLETS (1,000 MG TOTAL) BY MOUTH ONCE DAILY.(NEED APPT FOR FURTHER REFILLS)            Discharge Care Instructions  (From admission, onward)        Start     Ordered   10/19/17 0000  Discharge wound care:    Comments:  Wound vac change weekly   10/19/17 7564         Management plans discussed with the patient and he is in agreement. Stable for discharge SNF  Patient should follow up with dr schnier  CODE STATUS:     Code Status Orders  (From admission, onward)        Start     Ordered   10/01/17 1322  Full code  Continuous     10/01/17 1321    Code Status History    Date Active Date Inactive Code Status Order ID Comments User Context   This patient has a current code status but no historical code status.    Advance Directive Documentation     Most Recent Value  Type of Advance Directive  Healthcare Power of Attorney  Pre-existing out of facility DNR order (yellow form or pink MOST form)  No data  "MOST" Form in Place?  No data      TOTAL TIME TAKING CARE OF THIS PATIENT: 38 minutes.    Note: This dictation was prepared with Dragon dictation along with smaller phrase technology. Any transcriptional errors that result from this process are unintentional.  Racquelle Hyser M.D on 10/19/2017 at 1:24 PM  Between 7am to 6pm - Pager - [phone] After 6pm go to [email or web address] - password [email or web address] [facility]  Office  [phone]  CC: Primary care physician; [place] Martes Victoriano Lain, NP

## 2017-10-19 NOTE — Progress Notes (Signed)
Report called to Belenda Cruise, RN, at [facility].  EMS notified of transport need.

## 2017-10-19 NOTE — Progress Notes (Signed)
Attempted multiple times to contact Dr. Assunta Gambles office to schedule followup visit however their phones are down.  Notified [facility] of appointment needed.

## 2017-10-21 ENCOUNTER — Encounter: Payer: Self-pay | Admitting: Vascular Surgery

## 2017-10-26 ENCOUNTER — Encounter (INDEPENDENT_AMBULATORY_CARE_PROVIDER_SITE_OTHER): Payer: Self-pay | Admitting: Vascular Surgery

## 2017-10-26 ENCOUNTER — Ambulatory Visit (INDEPENDENT_AMBULATORY_CARE_PROVIDER_SITE_OTHER): Payer: BLUE CROSS/BLUE SHIELD | Admitting: Vascular Surgery

## 2017-10-26 ENCOUNTER — Telehealth (INDEPENDENT_AMBULATORY_CARE_PROVIDER_SITE_OTHER): Payer: Self-pay | Admitting: Vascular Surgery

## 2017-10-26 VITALS — BP 115/67 | HR 65 | Resp 18 | Wt 346.0 lb

## 2017-10-26 DIAGNOSIS — E119 Type 2 diabetes mellitus without complications: Secondary | ICD-10-CM

## 2017-10-26 DIAGNOSIS — I83009 Varicose veins of unspecified lower extremity with ulcer of unspecified site: Secondary | ICD-10-CM | POA: Insufficient documentation

## 2017-10-26 DIAGNOSIS — Z0289 Encounter for other administrative examinations: Secondary | ICD-10-CM

## 2017-10-26 DIAGNOSIS — L97909 Non-pressure chronic ulcer of unspecified part of unspecified lower leg with unspecified severity: Secondary | ICD-10-CM

## 2017-10-26 NOTE — Progress Notes (Signed)
Subjective:    Patient ID: Beverley [place], male    DOB: May 28, 1971, 47 y.o.   MRN: [id number] Chief Complaint  Patient presents with  . Wound Check    [facility] 1week follow up   Patient presents for his first postoperative follow-up.  The patient has been residing at [facility] for rehab.  The patient seen with family member.  The patient has not undergone a VAC change since he has left the hospital.  The patient denies any increased pain to the left lower extremity.  She denies any increased redness to the left lower extremity.  Fever, nausea vomiting.  Patient has not been assessed by the rehab his wound nurse.  The patient presented to our office with the VAC sponge attached however not attached to the machine.   Review of Systems  Constitutional: Negative.   HENT: Negative.   Eyes: Negative.   Respiratory: Negative.   Cardiovascular: Positive for leg swelling.  Gastrointestinal: Negative.   Endocrine: Negative.   Genitourinary: Negative.   Musculoskeletal: Negative.   Skin: Positive for wound.  Allergic/Immunologic: Negative.   Neurological: Negative.   Hematological: Negative.   Psychiatric/Behavioral: Negative.       Objective:   Physical Exam  Constitutional: He is oriented to person, place, and time. He appears well-developed and well-nourished. No distress.  HENT:  Head: Normocephalic and atraumatic.  Eyes: Conjunctivae are normal.  Neck: Normal range of motion.  Cardiovascular: Normal rate, regular rhythm, normal heart sounds and intact distal pulses.  Pulses:      Radial pulses are 2+ on the right side, and 2+ on the left side.  Hard to palpate pedal pulses due to body habitus and edema however his bilateral feet are warm  Pulmonary/Chest: Effort normal and breath sounds normal.  Musculoskeletal: He exhibits edema (Moderate nonpitting bilateral lower extremity edema noted).  Neurological: He is alert and oriented to person, place, and time.  Skin: He  is not diaphoretic.  Proximal calf wound: 100% Percent granulation tissue noted to wound bed.  Noninfected.  Minimal clear drainage. Distal calf wound: 100% granulation tissue noted to the wound bed.  Noninfected.  Clear drainage noted Small distal calf wound: Very small.  100% granulation tissue noted.  No drainage. Calf: Macerated dead skin noted.  This was gently debrided with a warm towel.  There is no active cellulitis Surrounding skin to each wound is healthy there is no necrotic tissue noted.   Psychiatric: He has a normal mood and affect. His behavior is normal. Judgment and thought content normal.  Vitals reviewed.  BP 115/67 (BP Location: Right Arm)   Pulse 65   Resp 18   Wt (!) 346 lb (156.9 kg)   BMI 42.12 kg/m   Past Medical History:  Diagnosis Date  . Diabetes mellitus without complication ([place])   . Hypertension   . Microalbuminuria   . Vitamin D deficiency    Social History   Socioeconomic History  . Marital status: Married    Spouse name: Not on file  . Number of children: Not on file  . Years of education: Not on file  . Highest education level: Not on file  Social Needs  . Financial resource strain: Not on file  . Food insecurity - worry: Not on file  . Food insecurity - inability: Not on file  . Transportation needs - medical: Not on file  . Transportation needs - non-medical: Not on file  Occupational History  . Not on  file  Tobacco Use  . Smoking status: Never Smoker  . Smokeless tobacco: Never Used  Substance and Sexual Activity  . Alcohol use: Yes    Comment: beer  . Drug use: Not on file  . Sexual activity: Not on file  Other Topics Concern  . Not on file  Social History Narrative  . Not on file   Past Surgical History:  Procedure Laterality Date  . APPLICATION OF WOUND VAC Left 10/03/2017   Procedure: APPLICATION OF WOUND VAC;  Surgeon: Algernon Huxley, MD;  Location: [facility] ORS;  Service: General;  Laterality: Left;  . APPLICATION OF WOUND  VAC Left 10/11/2017   Procedure: APPLICATION OF WOUND VAC;  Surgeon: Katha Cabal, MD;  Location: [facility] ORS;  Service: Vascular;  Laterality: Left;  . APPLICATION OF WOUND VAC Left 10/14/2017   Procedure: WOUND VAC CHANGE;  Surgeon: Katha Cabal, MD;  Location: [facility] ORS;  Service: Vascular;  Laterality: Left;  left lower leg  . APPLICATION OF WOUND VAC Left 10/18/2017   Procedure: WOUND VAC CHANGE;  Surgeon: Katha Cabal, MD;  Location: [facility] ORS;  Service: Vascular;  Laterality: Left;  . APPLICATION OF WOUND VAC Left 10/07/2017   Procedure: APPLICATION OF WOUND VAC;  Surgeon: Katha Cabal, MD;  Location: [facility] ORS;  Service: Vascular;  Laterality: Left;  . I&D EXTREMITY Left 10/11/2017   Procedure: IRRIGATION AND DEBRIDEMENT EXTREMITY;  Surgeon: Katha Cabal, MD;  Location: [facility] ORS;  Service: Vascular;  Laterality: Left;  . INCISION AND DRAINAGE ABSCESS Right 10/07/2017   Procedure: INCISION AND DRAINAGE ABSCESS;  Surgeon: Katha Cabal, MD;  Location: [facility] ORS;  Service: Vascular;  Laterality: Right;  . IRRIGATION AND DEBRIDEMENT ABSCESS Left 10/03/2017   Procedure: IRRIGATION AND DEBRIDEMENT ABSCESS with debridement of skin, soft tissue, muscle 50sq cm;  Surgeon: Algernon Huxley, MD;  Location: [facility] ORS;  Service: General;  Laterality: Left;   Family History  Problem Relation Age of Onset  . Diabetes Father   . Kidney disease Father   . Diabetes Daughter    Allergies  Allergen Reactions  . Lisinopril Swelling      Assessment & Plan:  Patient presents for his first postoperative follow-up.  The patient has been residing at [facility] for rehab.  The patient seen with family member.  The patient has not undergone a VAC change since he has left the hospital.  The patient denies any increased pain to the left lower extremity.  She denies any increased redness to the left lower extremity.  Fever, nausea vomiting.  Patient has not been assessed by the rehab his  wound nurse.  The patient presented to our office with the VAC sponge attached however not attached to the machine.  1. Venous ulcer ([place]) - Stable A progress note listing the following was sent to the patient's nursing home:  1) patient should undergo VAC dressing changes to all areas Monday Wednesday Friday 2) please clean Skin with normal saline on a daily basis and gently debride any dead skin to the calf 3) patient should elevate legs heart level or higher as much as possible this includes during the day 4) patient is to come to his next appointment with the VAC attached to the machine and extra dressing for changes. 5) patient needs to have a wound care nurse provide local wound care services to the patient 6) I would like a supervising nurse to please call the office to speak to 7)  the patient is to follow-up in 1 week for a wound check  2. Diabetes mellitus without complication ([place]) - Stable Encouraged good control as its slows the progression of atherosclerotic disease  Current Outpatient Medications on File Prior to Visit  Medication Sig Dispense Refill  . atenolol (TENORMIN) 100 MG tablet Take 1 tablet (100 mg total) by mouth daily. 30 tablet 0  . cephALEXin (KEFLEX) 500 MG capsule Take 1 capsule (500 mg total) by mouth 3 (three) times daily for 14 days. 42 capsule 0  . furosemide (LASIX) 40 MG tablet Take 1 tablet (40 mg total) by mouth 2 (two) times daily. 30 tablet 0  . gentamicin cream (GARAMYCIN) 0.1 % Apply 1 application topically 3 (three) times daily.    . insulin glargine (LANTUS) 100 UNIT/ML injection Inject 0.17 mLs (17 Units total) into the skin 2 (two) times daily. 10 mL 0  . metFORMIN (GLUCOPHAGE-XR) 500 MG 24 hr tablet TAKE 2 TABLETS (1,000 MG TOTAL) BY MOUTH ONCE DAILY.(NEED APPT FOR FURTHER REFILLS)  0   No current facility-administered medications on file prior to visit.    There are no Patient Instructions on file for this visit. No Follow-up on  file.  Toivo Bordon A Caila Cirelli, PA-C

## 2017-10-27 NOTE — Telephone Encounter (Signed)
Spoke to a Marine scientist at [employer].  As per nursing, the patient was supposedly refusing VAC changes stating that he was supposed to be sedated and taken to the special place for changes.  In the 40 minutes I spent with him and his family member yesterday I told him that he would not be taken to the operating room every other day for VAC changes.  The patient was told to ask for pain medicine before VAC changes.  I informed the nurse of this.  The nurses are also to provide local skin care to his macerated skin.  Also to provide a wound nurse.  Nurse expressed her understanding

## 2017-11-03 ENCOUNTER — Ambulatory Visit (INDEPENDENT_AMBULATORY_CARE_PROVIDER_SITE_OTHER): Payer: BLUE CROSS/BLUE SHIELD | Admitting: Vascular Surgery

## 2017-11-03 ENCOUNTER — Encounter (INDEPENDENT_AMBULATORY_CARE_PROVIDER_SITE_OTHER): Payer: Self-pay | Admitting: Vascular Surgery

## 2017-11-03 VITALS — BP 153/80 | HR 61 | Resp 18 | Wt 336.4 lb

## 2017-11-03 DIAGNOSIS — L97909 Non-pressure chronic ulcer of unspecified part of unspecified lower leg with unspecified severity: Secondary | ICD-10-CM

## 2017-11-03 DIAGNOSIS — I872 Venous insufficiency (chronic) (peripheral): Secondary | ICD-10-CM

## 2017-11-03 DIAGNOSIS — I10 Essential (primary) hypertension: Secondary | ICD-10-CM

## 2017-11-03 DIAGNOSIS — I83009 Varicose veins of unspecified lower extremity with ulcer of unspecified site: Secondary | ICD-10-CM

## 2017-11-03 DIAGNOSIS — I890 Lymphedema, not elsewhere classified: Secondary | ICD-10-CM | POA: Insufficient documentation

## 2017-11-03 NOTE — Progress Notes (Signed)
Patient ID: [name], male   DOB: 1971-08-18, 47 y.o.   MRN: [id number]  Chief Complaint  Patient presents with  . Wound Check    1week follow up    HPI [name] is a 47 y.o. male.  Patient is seen for follow up evaluation of leg pain and swelling associated with venous ulceration. He was recently in the hospital for cellulitis and debridement and required multiple surgeries.  The swelling abruptly became much worse bilaterally and is associated with pain and discoloration. The pain and swelling worsens with prolonged dependency and improves with elevation.  The patient notes that in the morning the legs are better but the leg symptoms worsened throughout the course of the day. The patient has also noted a progressive worsening of the discoloration in the ankle and shin area.   The patient notes that an ulcer has developed acutely without specific trauma and since it occurred it has been very slow to heal.  There is a moderate amount of drainage associated with the open area.  The wound is also very painful.  The patient notes that they were not able to tolerate the Unna boot and removed it several days ago.  The patient states that they have been elevating as much as possible. The patient denies any recent changes in medications.  The patient denies a history of DVT or PE. There is no prior history of phlebitis. There is no history of primary lymphedema.  No SOB or increased cough.  No sputum production.  No recent episodes of CHF exacerbation.    Past Medical History:  Diagnosis Date  . Diabetes mellitus without complication ([place])   . Hypertension   . Microalbuminuria   . Vitamin D deficiency     Past Surgical History:  Procedure Laterality Date  . APPLICATION OF WOUND VAC Left 10/03/2017   Procedure: APPLICATION OF WOUND VAC;  Surgeon: Algernon Huxley, MD;  Location: [facility];  Service: General;  Laterality: Left;  . APPLICATION OF WOUND VAC Left 10/11/2017   Procedure: APPLICATION OF WOUND VAC;  Surgeon: Katha Cabal, MD;  Location: [facility];  Service: Vascular;  Laterality: Left;  . APPLICATION OF WOUND VAC Left 10/14/2017   Procedure: WOUND VAC CHANGE;  Surgeon: Katha Cabal, MD;  Location: [facility];  Service: Vascular;  Laterality: Left;  left lower leg  . APPLICATION OF WOUND VAC Left 10/18/2017   Procedure: WOUND VAC CHANGE;  Surgeon: Katha Cabal, MD;  Location: [facility];  Service: Vascular;  Laterality: Left;  . APPLICATION OF WOUND VAC Left 10/07/2017   Procedure: APPLICATION OF WOUND VAC;  Surgeon: Katha Cabal, MD;  Location: [facility];  Service: Vascular;  Laterality: Left;  . I&D EXTREMITY Left 10/11/2017   Procedure: IRRIGATION AND DEBRIDEMENT EXTREMITY;  Surgeon: Katha Cabal, MD;  Location: [facility];  Service: Vascular;  Laterality: Left;  . INCISION AND DRAINAGE ABSCESS Right 10/07/2017   Procedure: INCISION AND DRAINAGE ABSCESS;  Surgeon: Katha Cabal, MD;  Location: [facility];  Service: Vascular;  Laterality: Right;  . IRRIGATION AND DEBRIDEMENT ABSCESS Left 10/03/2017   Procedure: IRRIGATION AND DEBRIDEMENT ABSCESS with debridement of skin, soft tissue, muscle 50sq cm;  Surgeon: Algernon Huxley, MD;  Location: [facility];  Service: General;  Laterality: Left;      Allergies  Allergen Reactions  . Lisinopril Swelling    Current Outpatient Medications  Medication Sig Dispense Refill  . atenolol (TENORMIN) 100 MG  tablet Take 1 tablet (100 mg total) by mouth daily. 30 tablet 0  . furosemide (LASIX) 40 MG tablet Take 1 tablet (40 mg total) by mouth 2 (two) times daily. 30 tablet 0  . gentamicin cream (GARAMYCIN) 0.1 % Apply 1 application topically 3 (three) times daily.    . insulin glargine (LANTUS) 100 UNIT/ML injection Inject 0.17 mLs (17 Units total) into the skin 2 (two) times daily. 10 mL 0  . metFORMIN (GLUCOPHAGE-XR) 500 MG 24 hr tablet TAKE 2 TABLETS (1,000 MG TOTAL) BY MOUTH ONCE DAILY.(NEED APPT  FOR FURTHER REFILLS)  0   No current facility-administered medications for this visit.         Physical Exam BP (!) 153/80 (BP Location: Right Arm)   Pulse 61   Resp 18   Wt (!) 336 lb 6.4 oz (152.6 kg)   BMI 40.95 kg/m  Gen:  WD/WN, NAD Skin: both leg wounds are clean and well granulating no infection 4+ edema hard and woody with severe venous rash     Assessment/Plan: 1. Venous ulcer ([place]) No surgery or intervention at this point in time.    I have had a long discussion with the patient regarding venous insufficiency and why it  causes symptoms, specifically venous ulceration . I have discussed with the patient the chronic skin changes that accompany venous insufficiency and the long term sequela such as infection and recurring  ulceration.  Patient will be VAC which will be changed weekly drainage permitting.  In addition, behavioral modification including several periods of elevation of the lower extremities during the day will be continued. Achieving a position with the ankles at heart level was stressed to the patient  The patient is instructed to begin routine exercise, especially walking on a daily basis  Patient should undergo duplex ultrasound of the venous system to ensure that DVT or reflux is not present.  Following the review of the ultrasound the patient will follow up in one week to reassess the degree of swelling and the control that Unna therapy is offering.   The patient can be assessed for graduated compression stockings or wraps as well as a Lymph Pump once the ulcers are healed.   2. Chronic venous insufficiency No surgery or intervention at this point in time.    I have had a long discussion with the patient regarding venous insufficiency and why it  causes symptoms. I have discussed with the patient the chronic skin changes that accompany venous insufficiency and the long term sequela such as infection and ulceration.  Patient will begin wearing  graduated compression stockings class 1 (20-30 mmHg) or compression wraps on a daily basis a prescription was given. The patient will put the stockings on first thing in the morning and removing them in the evening. The patient is instructed specifically not to sleep in the stockings.    In addition, behavioral modification including several periods of elevation of the lower extremities during the day will be continued. I have demonstrated that proper elevation is a position with the ankles at heart level.  The patient is instructed to begin routine exercise, especially walking on a daily basis  The patient should obtain a Lymph Pump ASAP  3. Lymphedema Recommend:  No surgery or intervention at this point in time.    I have reviewed my previous discussion with the patient regarding swelling and why it causes symptoms.  Patient will continue wearing graduated compression stockings class 1 (20-30 mmHg) on a daily  basis. The patient will  beginning wearing the stockings first thing in the morning and removing them in the evening. The patient is instructed specifically not to sleep in the stockings.    In addition, behavioral modification including several periods of elevation of the lower extremities during the day will be continued.  This was reviewed with the patient during the initial visit.  The patient will also continue routine exercise, especially walking on a daily basis as was discussed during the initial visit.    Despite conservative treatments including graduated compression therapy class 1 and behavioral modification including exercise and elevation the patient  has not obtained adequate control of the lymphedema.  The patient still has stage 3 lymphedema and therefore, I believe that a lymph pump should be added to improve the control of the patient's lymphedema.  Additionally, a lymph pump is warranted because it will reduce the risk of cellulitis and ulceration in the  future.  Patient should follow-up in six months    4. Essential hypertension Continue antihypertensive medications as already ordered, these medications have been reviewed and there are no changes at this time.       [name] 11/03/2017, 9:35 PM   This note was created with Dragon medical transcription system.  Any errors from dictation are unintentional.

## 2017-11-08 ENCOUNTER — Telehealth (INDEPENDENT_AMBULATORY_CARE_PROVIDER_SITE_OTHER): Payer: Self-pay

## 2017-11-08 NOTE — Telephone Encounter (Signed)
Merry Proud from [facility] [phone]) called asking what was the wound vac orders because he stated the patient verbalized he was getting it done once a week.I spoke with KS is advise for the patient to get it change M,W,F

## 2017-11-18 ENCOUNTER — Telehealth (INDEPENDENT_AMBULATORY_CARE_PROVIDER_SITE_OTHER): Payer: Self-pay

## 2017-11-18 NOTE — Telephone Encounter (Signed)
Nurse called to state that there is a new blister that has formed under the drape tape from the wound vac, so she is applying the wound vac differently and wants to let you know.  She states the she is applying the wound vac without bridging it, and applying it directly to the large wound at the top, is this okay?

## 2017-11-21 NOTE — Telephone Encounter (Signed)
Speak with Dr. Delana Meyer. He saw the patient last and may have wound dressing specifications. He usually does.

## 2017-11-28 ENCOUNTER — Ambulatory Visit (INDEPENDENT_AMBULATORY_CARE_PROVIDER_SITE_OTHER): Payer: BLUE CROSS/BLUE SHIELD | Admitting: Vascular Surgery

## 2017-11-28 VITALS — BP 161/85 | HR 67 | Resp 16 | Ht 76.0 in | Wt 374.0 lb

## 2017-11-28 DIAGNOSIS — L97909 Non-pressure chronic ulcer of unspecified part of unspecified lower leg with unspecified severity: Secondary | ICD-10-CM

## 2017-11-28 DIAGNOSIS — I10 Essential (primary) hypertension: Secondary | ICD-10-CM

## 2017-11-28 DIAGNOSIS — I83009 Varicose veins of unspecified lower extremity with ulcer of unspecified site: Secondary | ICD-10-CM | POA: Diagnosis not present

## 2017-11-28 DIAGNOSIS — I890 Lymphedema, not elsewhere classified: Secondary | ICD-10-CM

## 2017-11-28 DIAGNOSIS — I872 Venous insufficiency (chronic) (peripheral): Secondary | ICD-10-CM | POA: Diagnosis not present

## 2017-11-28 DIAGNOSIS — E119 Type 2 diabetes mellitus without complications: Secondary | ICD-10-CM

## 2017-11-30 ENCOUNTER — Encounter (INDEPENDENT_AMBULATORY_CARE_PROVIDER_SITE_OTHER): Payer: Self-pay | Admitting: Vascular Surgery

## 2017-11-30 NOTE — Progress Notes (Signed)
MRN : [id number]  [name] is a 47 y.o. (December 23, 1970) male who presents with chief complaint of  Chief Complaint  Patient presents with  . Follow-up    3week follow up  .  History of Present Illness: Patient is seen for follow up evaluation of leg pain and swelling associated with venous ulceration.  Since his hospitalization he has been treated as an outpatient with a VAC.  The swelling has been worse on the left leg and is associated with pain and discoloration.  The patient is unable to wear his compression because of the VAC and the hose.  He states the hose itself is now caused a new ulcer.  The pain and swelling worsens with prolonged dependency and improves with elevation.  The patient notes that in the morning the legs are better but the leg symptoms worsened throughout the course of the day. The patient has also noted a progressive worsening of the discoloration in the ankle and shin area.   The patient notes that they were not able to tolerate the Unna boot and removed it several days ago.  The patient states that they have been elevating as much as possible. The patient denies any recent changes in medications.  The patient denies a history of DVT or PE. There is no prior history of phlebitis. There is no history of primary lymphedema.  No SOB or increased cough.  No sputum production.  No recent episodes of CHF exacerbation.   Current Meds  Medication Sig  . amLODipine (NORVASC) 10 MG tablet Take by mouth.  [name] atenolol (TENORMIN) 100 MG tablet Take 1 tablet (100 mg total) by mouth daily.  . cephALEXin (KEFLEX) 500 MG capsule Take 500 mg by mouth 2 (two) times daily.  . furosemide (LASIX) 40 MG tablet Take 1 tablet (40 mg total) by mouth 2 (two) times daily. (Patient taking differently: Take 40 mg by mouth daily. )  . gentamicin cream (GARAMYCIN) 0.1 % Apply 1 application topically 3 (three) times daily.  . hydrALAZINE (APRESOLINE) 25 MG tablet Take 25 mg by mouth 3  (three) times daily.  . insulin glargine (LANTUS) 100 UNIT/ML injection Inject 0.17 mLs (17 Units total) into the skin 2 (two) times daily.  [name] JARDIANCE 25 MG TABS tablet TAKE 1 TABLET BY MOUTH EVERY DAY IN THE MORNING    Past Medical History:  Diagnosis Date  . Diabetes mellitus without complication ([place])   . Hypertension   . Microalbuminuria   . Vitamin D deficiency     Past Surgical History:  Procedure Laterality Date  . APPLICATION OF WOUND VAC Left 10/03/2017   Procedure: APPLICATION OF WOUND VAC;  Surgeon: Algernon Huxley, MD;  Location: [facility];  Service: General;  Laterality: Left;  . APPLICATION OF WOUND VAC Left 10/11/2017   Procedure: APPLICATION OF WOUND VAC;  Surgeon: Katha Cabal, MD;  Location: [facility];  Service: Vascular;  Laterality: Left;  . APPLICATION OF WOUND VAC Left 10/14/2017   Procedure: WOUND VAC CHANGE;  Surgeon: Katha Cabal, MD;  Location: [facility];  Service: Vascular;  Laterality: Left;  left lower leg  . APPLICATION OF WOUND VAC Left 10/18/2017   Procedure: WOUND VAC CHANGE;  Surgeon: Katha Cabal, MD;  Location: [facility];  Service: Vascular;  Laterality: Left;  . APPLICATION OF WOUND VAC Left 10/07/2017   Procedure: APPLICATION OF WOUND VAC;  Surgeon: Katha Cabal, MD;  Location: [facility];  Service: Vascular;  Laterality:  Left;  . I&D EXTREMITY Left 10/11/2017   Procedure: IRRIGATION AND DEBRIDEMENT EXTREMITY;  Surgeon: Katha Cabal, MD;  Location: [facility];  Service: Vascular;  Laterality: Left;  . INCISION AND DRAINAGE ABSCESS Right 10/07/2017   Procedure: INCISION AND DRAINAGE ABSCESS;  Surgeon: Katha Cabal, MD;  Location: [facility];  Service: Vascular;  Laterality: Right;  . IRRIGATION AND DEBRIDEMENT ABSCESS Left 10/03/2017   Procedure: IRRIGATION AND DEBRIDEMENT ABSCESS with debridement of skin, soft tissue, muscle 50sq cm;  Surgeon: Algernon Huxley, MD;  Location: [facility];  Service: General;  Laterality: Left;    Social  History Social History   Tobacco Use  . Smoking status: Never Smoker  . Smokeless tobacco: Never Used  Substance Use Topics  . Alcohol use: Yes    Comment: beer  . Drug use: Not on file    Family History Family History  Problem Relation Age of Onset  . Diabetes Father   . Kidney disease Father   . Diabetes Daughter     Allergies  Allergen Reactions  . Lisinopril Swelling     REVIEW OF SYSTEMS (Negative unless checked)  Constitutional: [] Weight loss  [] Fever  [] Chills Cardiac: [] Chest pain   [] Chest pressure   [] Palpitations   [] Shortness of breath when laying flat   [] Shortness of breath with exertion. Vascular:  [] Pain in legs with walking   [x] Pain in legs at rest  [x] History of DVT   [] Phlebitis   [x] Swelling in legs   [] Varicose veins   [x] Non-healing ulcers Pulmonary:   [] Uses home oxygen   [] Productive cough   [] Hemoptysis   [] Wheeze  [] COPD   [] Asthma Neurologic:  [] Dizziness   [] Seizures   [] History of stroke   [] History of TIA  [] Aphasia   [] Vissual changes   [] Weakness or numbness in arm   [] Weakness or numbness in leg Musculoskeletal:   [] Joint swelling   [] Joint pain   [] Low back pain Hematologic:  [] Easy bruising  [] Easy bleeding   [] Hypercoagulable state   [] Anemic Gastrointestinal:  [] Diarrhea   [] Vomiting  [] Gastroesophageal reflux/heartburn   [] Difficulty swallowing. Genitourinary:  [] Chronic kidney disease   [] Difficult urination  [] Frequent urination   [] Blood in urine Skin:  [x] Rashes   [x] Ulcers  Psychological:  [] History of anxiety   []  History of major depression.  Physical Examination  Vitals:   11/28/17 1114  BP: (!) 161/85  Pulse: 67  Resp: 16  Weight: (!) 374 lb (169.6 kg)  Height: 6\' 4"  (1.93 m)   Body mass index is 45.52 kg/m. Gen: WD/WN, NAD Head: [place]/AT, No temporalis wasting.  Ear/Nose/Throat: Hearing grossly intact, nares w/o erythema or drainage Eyes: PER, EOMI, sclera nonicteric.  Neck: Supple, no large masses.   Pulmonary:   Good air movement, no audible wheezing bilaterally, no use of accessory muscles.  Cardiac: RRR, no JVD Vascular: 4+ edema of the left leg with severe venous changes of the left leg.  Venous ulcer noted in the ankle area on the left, noninfected ulcer of the more proximal calf nearly healed Vessel Right Left  Radial Palpable Palpable  PT Palpable Palpable  DP Palpable Palpable  Gastrointestinal: Non-distended. No guarding/no peritoneal signs.  Musculoskeletal: M/S 5/5 throughout.  No deformity or atrophy.  Neurologic: CN 2-12 intact. Symmetrical.  Speech is fluent. Motor exam as listed above. Psychiatric: Judgment intact, Mood & affect appropriate for pt's clinical situation. Dermatologic: Venous stasis dermatitis with ulcers present on the left.  No changes consistent with cellulitis. Lymph : No  lichenification or skin changes of chronic lymphedema.  CBC Lab Results  Component Value Date   WBC 5.9 10/19/2017   HGB 8.4 (L) 10/19/2017   HCT 25.6 (L) 10/19/2017   MCV 89.1 10/19/2017   PLT 320 10/19/2017    BMET    Component Value Date/Time   NA 139 10/19/2017 0311   K 4.2 10/19/2017 0311   CL 107 10/19/2017 0311   CO2 24 10/19/2017 0311   GLUCOSE 109 (H) 10/19/2017 0311   BUN 37 (H) 10/19/2017 0311   CREATININE 2.11 (H) 10/19/2017 0311   CALCIUM 8.6 (L) 10/19/2017 0311   GFRNONAA 35 (L) 10/19/2017 0311   GFRAA 41 (L) 10/19/2017 0311   CrCl cannot be calculated (Patient's most recent lab result is older than the maximum 21 days allowed.).  COAG Lab Results  Component Value Date   INR 1.05 10/14/2017   INR 1.16 10/06/2017    Radiology No results found.   Assessment/Plan 1. Venous ulcer ([place]) No surgery or intervention at this point in time.    I have had a long discussion with the patient regarding venous insufficiency and why it  causes symptoms, specifically venous ulceration . I have discussed with the patient the chronic skin changes that accompany venous  insufficiency and the long term sequela such as infection and recurring  ulceration.  Patient will be place in an [place] boot which will be changed weekly drainage permitting.  I am making the change based on the severe edema that is present  In addition, behavioral modification including several periods of elevation of the lower extremities during the day will be continued. Achieving a position with the ankles at heart level was stressed to the patient  The patient is instructed to begin routine exercise, especially walking on a daily basis  2. Chronic venous insufficiency No surgery or intervention at this point in time.    I have had a long discussion with the patient regarding venous insufficiency and why it  causes symptoms. I have discussed with the patient the chronic skin changes that accompany venous insufficiency and the long term sequela such as infection and ulceration.  Patient will begin wearing graduated compression stockings class 1 (20-30 mmHg) or compression wraps on a daily basis a prescription was given. The patient will put the stockings on first thing in the morning and removing them in the evening. The patient is instructed specifically not to sleep in the stockings.    In addition, behavioral modification including several periods of elevation of the lower extremities during the day will be continued. I have demonstrated that proper elevation is a position with the ankles at heart level.  The patient is instructed to begin routine exercise, especially walking on a daily basis  The patient should obtain a Lymph Pump ASAP  3. Lymphedema See #1&2  4. Diabetes mellitus without complication ([place]) Continue hypoglycemic medications as already ordered, these medications have been reviewed and there are no changes at this time.  Hgb A1C to be monitored as already arranged by primary service   5. Essential hypertension Continue antihypertensive medications as already  ordered, these medications have been reviewed and there are no changes at this time.     [name], MD  11/30/2017 3:58 PM

## 2017-12-01 ENCOUNTER — Telehealth (INDEPENDENT_AMBULATORY_CARE_PROVIDER_SITE_OTHER): Payer: Self-pay

## 2017-12-01 NOTE — Telephone Encounter (Signed)
Merry Proud from [facility]([phone]) called needing the wound care instructions for patient.I spoke with GS and he advise for the home health nurse to change the unna boot on Friday and if everything is healing,no complications,or no swelling they can start changing the unna boots once a week.I left a message for the home health nurse with the instructions

## 2017-12-19 ENCOUNTER — Telehealth (INDEPENDENT_AMBULATORY_CARE_PROVIDER_SITE_OTHER): Payer: Self-pay

## 2017-12-19 NOTE — Telephone Encounter (Signed)
Lelan Pons called from [employer] to ask if you had received that required paperwork that is needed to complete that process for this patient's Lymph pump. She states that it was faxed over, but did not state what date.

## 2017-12-20 NOTE — Telephone Encounter (Signed)
Called Lelan Pons back to let her know that the paperwork has not yet been received and she has agreed to resend it today.

## 2017-12-20 NOTE — Telephone Encounter (Signed)
Did you check the doctors desk, Media or the scan documents at my desk.

## 2017-12-22 LAB — SUSCEPTIBILITY, AER + ANAEROB

## 2017-12-28 ENCOUNTER — Other Ambulatory Visit
Admission: RE | Admit: 2017-12-28 | Discharge: 2017-12-28 | Disposition: A | Discharge status: Home / Self Care | Payer: BLUE CROSS/BLUE SHIELD | Source: Ambulatory Visit | Attending: Internal Medicine | Admitting: Internal Medicine

## 2017-12-28 ENCOUNTER — Encounter: Payer: BLUE CROSS/BLUE SHIELD | Attending: Internal Medicine | Admitting: Internal Medicine

## 2017-12-28 DIAGNOSIS — E1140 Type 2 diabetes mellitus with diabetic neuropathy, unspecified: Secondary | ICD-10-CM | POA: Insufficient documentation

## 2017-12-28 DIAGNOSIS — I890 Lymphedema, not elsewhere classified: Secondary | ICD-10-CM | POA: Insufficient documentation

## 2017-12-28 DIAGNOSIS — B999 Unspecified infectious disease: Secondary | ICD-10-CM | POA: Insufficient documentation

## 2017-12-28 DIAGNOSIS — E11622 Type 2 diabetes mellitus with other skin ulcer: Secondary | ICD-10-CM | POA: Diagnosis not present

## 2017-12-28 DIAGNOSIS — E1151 Type 2 diabetes mellitus with diabetic peripheral angiopathy without gangrene: Secondary | ICD-10-CM | POA: Diagnosis not present

## 2017-12-28 DIAGNOSIS — L97223 Non-pressure chronic ulcer of left calf with necrosis of muscle: Secondary | ICD-10-CM | POA: Insufficient documentation

## 2017-12-28 DIAGNOSIS — I87332 Chronic venous hypertension (idiopathic) with ulcer and inflammation of left lower extremity: Secondary | ICD-10-CM | POA: Insufficient documentation

## 2017-12-29 ENCOUNTER — Ambulatory Visit (INDEPENDENT_AMBULATORY_CARE_PROVIDER_SITE_OTHER): Payer: BLUE CROSS/BLUE SHIELD | Admitting: Vascular Surgery

## 2017-12-29 ENCOUNTER — Encounter (INDEPENDENT_AMBULATORY_CARE_PROVIDER_SITE_OTHER): Payer: Self-pay | Admitting: Vascular Surgery

## 2017-12-29 ENCOUNTER — Other Ambulatory Visit: Payer: Self-pay

## 2017-12-29 VITALS — BP 178/83 | HR 65 | Resp 18 | Ht 76.0 in | Wt 381.0 lb

## 2017-12-29 DIAGNOSIS — I83009 Varicose veins of unspecified lower extremity with ulcer of unspecified site: Secondary | ICD-10-CM

## 2017-12-29 DIAGNOSIS — E119 Type 2 diabetes mellitus without complications: Secondary | ICD-10-CM

## 2017-12-29 DIAGNOSIS — I872 Venous insufficiency (chronic) (peripheral): Secondary | ICD-10-CM

## 2017-12-29 DIAGNOSIS — I890 Lymphedema, not elsewhere classified: Secondary | ICD-10-CM

## 2017-12-29 DIAGNOSIS — I10 Essential (primary) hypertension: Secondary | ICD-10-CM

## 2017-12-29 DIAGNOSIS — L97909 Non-pressure chronic ulcer of unspecified part of unspecified lower leg with unspecified severity: Secondary | ICD-10-CM

## 2017-12-29 NOTE — Progress Notes (Signed)
MRN : [id number]  [name] is a 47 y.o. ([birth year]) male who presents with chief complaint of  Chief Complaint  Patient presents with  . Venous Stasis Ulcer    left leg  . Shortness of Breath  .  History of Present Illness:   Patient is seen for follow up evaluation of leg pain and swelling associated with venous ulceration.  Since his hospitalization he has been treated as an outpatient with a VAC.  The swelling has been worse on the left leg and is associated with pain and discoloration.  The patient is unable to wear his compression because of the VAC and the hose.  He states the hose itself is now caused a new ulcer.  The pain and swelling worsens with prolonged dependency and improves with elevation.  The patient notes that in the morning the legs are better but the leg symptoms worsened throughout the course of the day. The patient has also noted a progressive worsening of the discoloration in the ankle and shin area.   The patient notes that they were not able to tolerate the Unna boot and removed it several days ago.  The patient states that they have been elevating as much as possible. The patient denies any recent changes in medications.  The patient denies a history of DVT or PE. There is no prior history of phlebitis. There is no history of primary lymphedema.  No SOB or increased cough.  No sputum production.  No recent episodes of CHF exacerbation.    Current Meds  Medication Sig  . amLODipine (NORVASC) 10 MG tablet Take by mouth.  [name] atenolol (TENORMIN) 100 MG tablet Take 1 tablet (100 mg total) by mouth daily.  . cephALEXin (KEFLEX) 500 MG capsule Take 500 mg by mouth 2 (two) times daily.  . furosemide (LASIX) 40 MG tablet Take 1 tablet (40 mg total) by mouth 2 (two) times daily. (Patient taking differently: Take 40 mg by mouth daily. )  . hydrALAZINE (APRESOLINE) 25 MG tablet Take 50 mg by mouth 3 (three) times daily.   . Insulin Glargine (LANTUS  SOLOSTAR) 100 UNIT/ML Solostar Pen Inject into the skin.  [name] JARDIANCE 25 MG TABS tablet TAKE 1 TABLET BY MOUTH EVERY DAY IN THE MORNING  . [DISCONTINUED] furosemide (LASIX) 40 MG tablet Take by mouth.    Past Medical History:  Diagnosis Date  . Diabetes mellitus without complication ([place])   . Hypertension   . Microalbuminuria   . Vitamin D deficiency     Past Surgical History:  Procedure Laterality Date  . APPLICATION OF WOUND VAC Left 10/03/2017   Procedure: APPLICATION OF WOUND VAC;  Surgeon: Algernon Huxley, MD;  Location: [facility];  Service: General;  Laterality: Left;  . APPLICATION OF WOUND VAC Left 10/11/2017   Procedure: APPLICATION OF WOUND VAC;  Surgeon: Katha Cabal, MD;  Location: [facility];  Service: Vascular;  Laterality: Left;  . APPLICATION OF WOUND VAC Left 10/14/2017   Procedure: WOUND VAC CHANGE;  Surgeon: Katha Cabal, MD;  Location: [facility];  Service: Vascular;  Laterality: Left;  left lower leg  . APPLICATION OF WOUND VAC Left 10/18/2017   Procedure: WOUND VAC CHANGE;  Surgeon: Katha Cabal, MD;  Location: [facility];  Service: Vascular;  Laterality: Left;  . APPLICATION OF WOUND VAC Left 10/07/2017   Procedure: APPLICATION OF WOUND VAC;  Surgeon: Katha Cabal, MD;  Location: [facility];  Service: Vascular;  Laterality: Left;  .  I&D EXTREMITY Left 10/11/2017   Procedure: IRRIGATION AND DEBRIDEMENT EXTREMITY;  Surgeon: Katha Cabal, MD;  Location: [facility];  Service: Vascular;  Laterality: Left;  . INCISION AND DRAINAGE ABSCESS Right 10/07/2017   Procedure: INCISION AND DRAINAGE ABSCESS;  Surgeon: Katha Cabal, MD;  Location: [facility];  Service: Vascular;  Laterality: Right;  . IRRIGATION AND DEBRIDEMENT ABSCESS Left 10/03/2017   Procedure: IRRIGATION AND DEBRIDEMENT ABSCESS with debridement of skin, soft tissue, muscle 50sq cm;  Surgeon: Algernon Huxley, MD;  Location: [facility];  Service: General;  Laterality: Left;    Social History Social  History   Tobacco Use  . Smoking status: Never Smoker  . Smokeless tobacco: Never Used  Substance Use Topics  . Alcohol use: Yes    Comment: beer  . Drug use: Not on file    Family History Family History  Problem Relation Age of Onset  . Diabetes Father   . Kidney disease Father   . Diabetes Daughter     Allergies  Allergen Reactions  . Lisinopril Swelling  . Furosemide Other (See Comments)    Light headed, cough, blurry vision, weakness.     REVIEW OF SYSTEMS (Negative unless checked)  Constitutional: [] Weight loss  [] Fever  [] Chills Cardiac: [] Chest pain   [] Chest pressure   [] Palpitations   [] Shortness of breath when laying flat   [] Shortness of breath with exertion. Vascular:  [x] Pain in legs with walking   [x] Pain in legs at rest  [] History of DVT   [] Phlebitis   [x] Swelling in legs   [] Varicose veins   [] Non-healing ulcers Pulmonary:   [] Uses home oxygen   [] Productive cough   [] Hemoptysis   [] Wheeze  [] COPD   [] Asthma Neurologic:  [] Dizziness   [] Seizures   [] History of stroke   [] History of TIA  [] Aphasia   [] Vissual changes   [] Weakness or numbness in arm   [] Weakness or numbness in leg Musculoskeletal:   [] Joint swelling   [x] Joint pain   [x] Low back pain Hematologic:  [] Easy bruising  [] Easy bleeding   [] Hypercoagulable state   [] Anemic Gastrointestinal:  [] Diarrhea   [] Vomiting  [] Gastroesophageal reflux/heartburn   [] Difficulty swallowing. Genitourinary:  [] Chronic kidney disease   [] Difficult urination  [] Frequent urination   [] Blood in urine Skin:  [x] Rashes   [x] Ulcers  Psychological:  [] History of anxiety   []  History of major depression.  Physical Examination  Vitals:   12/29/17 0855  BP: (!) 178/83  Pulse: 65  Resp: 18  Weight: (!) 381 lb (172.8 kg)  Height: 6\' 4"  (1.93 m)   Body mass index is 46.38 kg/m. Gen: WD/WN, NAD Head: [place]/AT, No temporalis wasting.  Ear/Nose/Throat: Hearing grossly intact, nares w/o erythema or drainage Eyes: PER,  EOMI, sclera nonicteric.  Neck: Supple, no large masses.   Pulmonary:  Good air movement, no audible wheezing bilaterally, no use of accessory muscles.  Cardiac: RRR, no JVD Vascular: 4+ edema of the left leg with severe venous changes of the left leg.  Venous ulcer noted in the ankle area on the left, noninfected Vessel Right Left  Radial Palpable Palpable  Gastrointestinal: Non-distended. No guarding/no peritoneal signs.  Musculoskeletal: M/S 5/5 throughout.  No deformity or atrophy.  Neurologic: CN 2-12 intact. Symmetrical.  Speech is fluent. Motor exam as listed above. Psychiatric: Judgment intact, Mood & affect appropriate for pt's clinical situation. Dermatologic: Venous stasis dermatitis with ulcers present on the left.  No changes consistent with cellulitis. Lymph : No lichenification or skin changes  of chronic lymphedema.  CBC Lab Results  Component Value Date   WBC 5.9 10/19/2017   HGB 8.4 (L) 10/19/2017   HCT 25.6 (L) 10/19/2017   MCV 89.1 10/19/2017   PLT 320 10/19/2017    BMET    Component Value Date/Time   NA 139 10/19/2017 0311   K 4.2 10/19/2017 0311   CL 107 10/19/2017 0311   CO2 24 10/19/2017 0311   GLUCOSE 109 (H) 10/19/2017 0311   BUN 37 (H) 10/19/2017 0311   CREATININE 2.11 (H) 10/19/2017 0311   CALCIUM 8.6 (L) 10/19/2017 0311   GFRNONAA 35 (L) 10/19/2017 0311   GFRAA 41 (L) 10/19/2017 0311   CrCl cannot be calculated (Patient's most recent lab result is older than the maximum 21 days allowed.).  COAG Lab Results  Component Value Date   INR 1.05 10/14/2017   INR 1.16 10/06/2017    Radiology No results found.   Assessment/Plan 1. Venous ulcer ([place]) No surgery or intervention at this point in time.    I have had a long discussion with the patient regarding venous insufficiency and why it  causes symptoms, specifically venous ulceration . I have discussed with the patient the chronic skin changes that accompany venous insufficiency and the long  term sequela such as infection and recurring  ulceration.  Patient will be placed in [employer] which will be changed weekly drainage permitting.  The patient is very concerned about the length of time that it is taking for this wound to heal.  He also feels that he is retaining fluid.  He is concerned that he is short of breath with laying flat he is concerned that he is short of breath with walking.  He states that he has been assessed for sleep apnea but this is not something that will help him at this time.  I discussed at length skin grafting to expedite wound healing but also informed him the risk that it would not take.  He is not willing to undergo this surgery.  I have contacted Dr. [name] directly to expedite a visit with Dr. [name] to assess his overall fluid status.  I have checked on the paperwork for his lymph pump and this is still pending secondary to insurance claim issues.  In addition, behavioral modification including several periods of elevation of the lower extremities during the day will be continued. Achieving a position with the ankles at heart level was stressed to the patient  The patient is instructed to begin routine exercise, especially walking on a daily basis  Patient should undergo duplex ultrasound of the venous system to ensure that DVT or reflux is not present.  Following the review of the ultrasound the patient will follow up in one week to reassess the degree of swelling and the control that Unna therapy is offering.   The patient can be assessed for graduated compression stockings or wraps as well as a Lymph Pump once the ulcers are healed.   2. Chronic venous insufficiency See #1  3. Lymphedema See #1  4. Diabetes mellitus without complication ([place]) Continue hypoglycemic medications as already ordered, these medications have been reviewed and there are no changes at this time.  Hgb A1C to be monitored as already arranged by primary service   5.  Essential hypertension Continue antihypertensive medications as already ordered, these medications have been reviewed and there are no changes at this time.     [name], MD  12/29/2017 8:15 PM

## 2017-12-30 LAB — AEROBIC CULTURE  (SUPERFICIAL SPECIMEN)

## 2017-12-30 LAB — AEROBIC CULTURE W GRAM STAIN (SUPERFICIAL SPECIMEN)

## 2017-12-31 NOTE — Progress Notes (Signed)
[name], [name] ([id number]) Visit Report for 12/28/2017 Chief Complaint Document Details Patient Name: [name], [name] Date of Service: 12/28/2017 8:00 AM Medical Record Number: [id number] Patient Account Number: [id number] Date of Birth/Sex: November 15, 1970 (47 y.o. M) Treating RN: Montey Hora Primary Care Provider: Gaetano Net Other Clinician: Referring Provider: Referral, Self Treating Provider/Extender: Tito Dine in Treatment: 0 Information Obtained from: Patient Chief Complaint 12/28/17; patient is here for review of 2 surgical wounds on the left calf Electronic Signature(s) Signed: 12/28/2017 4:28:35 PM By: Linton Ham MD Entered By: Linton Ham on 12/28/2017 09:29:39 Dalal, Moritz A. ([id number]) -------------------------------------------------------------------------------- HPI Details Patient Name: [name], [name] Date of Service: 12/28/2017 8:00 AM Medical Record Number: [id number] Patient Account Number: [id number] Date of Birth/Sex: [birth year] (47 y.o. M) Treating RN: Montey Hora Primary Care Provider: Gaetano Net Other Clinician: Referring Provider: Referral, Self Treating Provider/Extender: Tito Dine in Treatment: 0 History of Present Illness HPI Description: 12/28/17; this is a now 47 year old man who is a type II diabetic. He was hospitalized from 10/01/17 through 10/19/17. He had an MSSA soft tissue and skin infection. 2 open areas on the left leg were identified he has a smaller area on the left medial calf superiorly just below the knee and a wound just above the left ankle on the posterior medial aspect. I think both of these were surgical IandD sites when he was in the hospital. He was discharged with a wound VAC at that point however this is since been taken off. He follows with Dr. Ola Spurr for the [facility] and he is still on chronic Keflex at 500 twice a day. At that time he was hospitalized his hemoglobin A1c was 15.1 however if I'm  reading his endocrinologist notes correctly that is improved. He has been following with [name] and he has been applying calcium alginate and Unna boots. He has home health changing the dressing. They have also been attempting to get him external compression pumps although the patient is unaware whether they've been approved by insurance at this point. as mentioned he has a smaller clean wound on the right lateral calf just below the knee and he has a much larger area just above the left ankle medially and posteriorly. Our intake nurse reported greenish purulent looking drainage.the patient did have surgical material sent to pathology in February. This showed chronic abscess The patient also has lymphedema stage III in the left greater than right lower extremities. He has a history of blisters with wounds but these of all were always healed. The patient thinks that the lymphedema may have been present since he was about 47 years old i.e. about 30 years. He does not have graded pressure stockings and has not worn stockings. He does not have a distant history of DVT PE or phlebitis. He has not been systemically unwell fever no chills. He states that his Lasix is recently been reduced. He tells me his kidney function is at "30%" and he has been followed by Dr. [name] of nephrology. At one point he was on Lasix 80 twice a day however that's been cut back and he is now on Lasix at 20 twice a day. The patient has a history of PAD listed in his records although he comes from Dr. [name] I don't think is felt to have significant PAD. ABIs in our clinic were noncompressible bilaterally. Electronic Signature(s) Signed: 12/28/2017 4:28:35 PM By: Linton Ham MD Entered By: Linton Ham on 12/28/2017 09:36:17 Daughtrey,  Jacobb A. ([id number]) -------------------------------------------------------------------------------- Physical Exam Details Patient Name: [name], [name] Date of  Service: 12/28/2017 8:00 AM Medical Record Number: [id number] Patient Account Number: [id number] Date of Birth/Sex: Oct 28, 1970 (47 y.o. M) Treating RN: Montey Hora Primary Care Provider: Gaetano Net Other Clinician: Referring Provider: Referral, Self Treating Provider/Extender: Tito Dine in Treatment: 0 Constitutional Patient is hypertensive.. Pulse regular and within target range for patient.[name] Respirations regular, non-labored and within target range.. Temperature is normal and within the target range for the patient.[name] appears in no distress. Eyes Conjunctivae clear. No discharge. Respiratory Respiratory effort is easy and symmetric bilaterally. Rate is normal at rest and on room air.[name] respiratory auscultation reveals decreased air entry but no crackles or wheezes. Cardiovascular heart sounds are normal there is no murmurs however his JVP is elevated at 45o. femoral pulses are palpable bilaterally. dorsalis pedis pulses are palpable bilaterally however somewhat reduced and difficult to feel through the swelling in his feet. Gastrointestinal (GI) distended but I could not detect any shifting dullness. No liver or spleen enlargement or tenderness.. Lymphatic none palpable in the popliteal or inguinal area. Integumentary (Hair, Skin) severe lymphedema bilaterally left greater than right. Changes of chronic venous insufficiency. There are raised nodules on the right leg skin changes of chronic lymphedema.[name] Psychiatric No evidence of depression, anxiety, or agitation. Calm, cooperative, and communicative. Appropriate interactions and affect.. Notes wound exam; the patient has a small healthy well granulated wound on the left upper lateral calf. Small undermining area nothing looks infected. oOn the left posterior medial calf is a large wound. There is undermining inferiorly at roughly 10 to 11:00 but not that much. Although there was purulent drainage described by her  intake nurse I wasn't convinced there was active surrounding infection. I did culture the undermining area of this but I did not change the antibiotics. There is no surrounding erythema. oThe patient has severe bilateral left greater than right stage III lymphedema Electronic Signature(s) Signed: 12/28/2017 4:28:35 PM By: Linton Ham MD Entered By: Linton Ham on 12/28/2017 09:51:46 Grealish, Earley A. ([id number]) -------------------------------------------------------------------------------- Physician Orders Details Patient Name: Shannahan, Jahred A. Date of Service: 12/28/2017 8:00 AM Medical Record Number: [id number] Patient Account Number: [id number] Date of Birth/Sex: 1971/05/23 (47 y.o. M) Treating RN: Montey Hora Primary Care Provider: Gaetano Net Other Clinician: Referring Provider: Referral, Self Treating Provider/Extender: Tito Dine in Treatment: 0 Verbal / Phone Orders: No Diagnosis Coding Wound Cleansing Wound #1 Left,Proximal,Medial Lower Leg o Cleanse wound with mild soap and water o May shower with protection. - Do not get wraps wet Wound #2 Left,Lateral Lower Leg o Cleanse wound with mild soap and water o May shower with protection. - Do not get wraps wet Anesthetic (add to Medication List) Wound #1 Left,Proximal,Medial Lower Leg o Topical Lidocaine 4% cream applied to wound bed prior to debridement (In Clinic Only). Wound #2 Left,Lateral Lower Leg o Topical Lidocaine 4% cream applied to wound bed prior to debridement (In Clinic Only). Primary Wound Dressing Wound #1 Left,Proximal,Medial Lower Leg o Silver Alginate Wound #2 Left,Lateral Lower Leg o Silver Alginate Secondary Dressing Wound #1 Left,Proximal,Medial Lower Leg o ABD pad o XtraSorb Wound #2 Left,Lateral Lower Leg o ABD pad o XtraSorb Dressing Change Frequency Wound #1 Left,Proximal,Medial Lower Leg o Change Dressing Monday, Wednesday, Friday Wound #2  Left,Lateral Lower Leg o Change Dressing Monday, Wednesday, Friday Follow-up Appointments Wound #1 Left,Proximal,Medial Lower Leg o Return Appointment in 1 week. Krider, Adolphus A. ([id number]) Wound #2 Left,Lateral Lower Leg   o Return Appointment in 1 week. Edema Control Wound #1 Left,Proximal,Medial Lower Leg o Unna Boot to Left Lower Extremity o Patient to wear own compression stockings - on RLE Wound #2 Left,Lateral Lower Leg o Unna Boot to Left Lower Extremity o Patient to wear own compression stockings - on RLE Additional Orders / Instructions Wound #1 Left,Proximal,Medial Lower Leg o Vitamin A; Vitamin C, Zinc - please add a multivitamin with 100% of each of these o Increase protein intake. o Other: - Please make an appointment with Dr [name] asap Wound #2 Left,Lateral Lower Leg o Vitamin A; Vitamin C, Zinc - please add a multivitamin with 100% of each of these o Increase protein intake. o Other: - Please make an appointment with Dr [name] asap Home Health Wound #1 Left,Proximal,Medial [place] Nurse may visit PRN to address patientos wound care needs. o FACE TO FACE ENCOUNTER: MEDICARE and MEDICAID PATIENTS: I certify that this patient is under my care and that I had a face-to-face encounter that meets the physician face-to-face encounter requirements with this patient on this date. The encounter with the patient was in whole or in part for the following MEDICAL CONDITION: (primary reason for [place]) MEDICAL NECESSITY: I certify, that based on my findings, NURSING services are a medically necessary home health service. HOME BOUND STATUS: I certify that my clinical findings support that this patient is homebound (i.e., Due to illness or injury, pt requires aid of supportive devices such as crutches, cane, wheelchairs, walkers, the use of special transportation or the assistance of another person  to leave their place of residence. There is a normal inability to leave the home and doing so requires considerable and taxing effort. Other absences are for medical reasons / religious services and are infrequent or of short duration when for other reasons). o If current dressing causes regression in wound condition, may D/C ordered dressing product/s and apply Normal Saline Moist Dressing daily until next [place] / Other MD appointment. [place] of regression in wound condition at [phone]. o Please direct any NON-WOUND related issues/requests for orders to patient's Primary Care Physician Wound #2 [place] Nurse may visit PRN to address patientos wound care needs. o FACE TO FACE ENCOUNTER: MEDICARE and MEDICAID PATIENTS: I certify that this patient is under my care and that I had a face-to-face encounter that meets the physician face-to-face encounter requirements with this patient on this date. The encounter with the patient was in whole or in part for the following MEDICAL CONDITION: (primary reason for [place]) MEDICAL NECESSITY: I certify, that based on my findings, NURSING services are a medically necessary home health service. HOME BOUND STATUS: I certify that my clinical findings support that this patient is homebound (i.e., Due to illness or injury, pt requires aid of supportive devices such as crutches, cane, wheelchairs, walkers, the use of special transportation or the assistance of another person to leave their place of residence. There is a normal inability to leave the home and doing so requires considerable and taxing effort. Other absences are for medical reasons / religious services and are infrequent or of short duration when for other reasons). Gilbert, Argus A. ([id number]) o If current dressing causes regression in wound condition, may D/C ordered dressing  product/s and apply Normal Saline Moist Dressing daily until next Cec[place] / Other MD appointment. Notify [facility]aling  [facility] of regression in wound condition at [phone]. o Please direct any NON-WOUND related issues/requests for orders to patient's Primary Care Physician Laboratory o Bacteria identified in Wound by Culture (MICRO) oooo LOINC Code: [id number] oooo Convenience Name: Wound culture routine Electronic Signature(s) Signed: 12/28/2017 4:28:35 PM By: Linton Ham MD Signed: 12/28/2017 4:48:19 PM By: Montey Hora Entered By: Montey Hora on 12/28/2017 09:19:43 Vandezande, Kalim A. ([id number]) -------------------------------------------------------------------------------- Problem List Details Patient Name: Hocevar, Kolbee A. Date of Service: 12/28/2017 8:00 AM Medical Record Number: [id number] Patient Account Number: [id number] Date of Birth/Sex: March 07, 1971 (47 y.o. 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 ([id number]) -------------------------------------------------------------------------------- Progress Note Details Patient Name: Routson, Sunil A. Date of Service: 12/28/2017 8:00 AM Medical Record Number: [id number] Patient  Account Number: [id number] Date of Birth/Sex: 06/12/1971 (47 y.o. M) Treating RN: Montey Hora Primary Care Provider: Gaetano Net Other Clinician: Referring Provider: Referral, Self Treating Provider/Extender: Tito Dine in Treatment: 0 Subjective Chief Complaint Information obtained from Patient 12/28/17; patient is here for review of 2 surgical wounds on the left calf History of Present Illness (HPI) 12/28/17; this is a now 47 year old man who is a type II diabetic. He was hospitalized from 10/01/17 through 10/19/17. He had an MSSA soft tissue and skin infection. 2 open areas on the left leg were identified he has a smaller area on the left medial calf superiorly just below the knee and a wound just above the left ankle on the posterior medial aspect. I think both of these were surgical IandD sites when he was in the hospital. He was discharged with a wound VAC at that point however this is since been taken off. He follows with Dr. Ola Spurr for the [facility] and he is still on chronic Keflex at 500 twice a day. At that time he was hospitalized his hemoglobin A1c was 15.1 however if I'm reading his endocrinologist notes correctly that is improved. He has been following with Dr. [name] and he has been applying calcium alginate and Unna boots. He has home health changing the dressing. They have also been attempting to get him external compression pumps although the patient is unaware whether they've been approved by insurance at this point. as mentioned he has a smaller clean wound on the right lateral calf just below the knee and he has a much larger area just above the left ankle medially and posteriorly. Our intake nurse reported greenish purulent looking drainage.the patient did have surgical material sent to pathology in February. This showed chronic abscess The patient also has lymphedema stage III in the left greater than right lower extremities. He has a  history of blisters with wounds but these of all were always healed. The patient thinks that the lymphedema may have been present since he was about 47 years old i.e. about 30 years. He does not have graded pressure stockings and has not worn stockings. He does  not have a distant history of DVT PE or phlebitis. He has not been systemically unwell fever no chills. He states that his Lasix is recently been reduced. He tells me his kidney function is at "30%" and he has been followed by Dr. [name] of nephrology. At one point he was on Lasix 80 twice a day however that's been cut back and he is now on Lasix at 20 twice a day. The patient has a history of PAD listed in his records although he comes from Dr. [name] I don't think is felt to have significant PAD. ABIs in our clinic were noncompressible bilaterally. Wound History Patient presents with 2 open wounds that have been present for approximately 2 months. Patient has been treating wounds in the following manner: unna boot. Laboratory tests have not been performed in the last month. Patient reportedly has not tested positive for an antibiotic resistant organism. Patient reportedly has not tested positive for osteomyelitis. Patient reportedly has not had testing performed to evaluate circulation in the legs. Patient experiences the following problems associated with their wounds: swelling. Patient History Information obtained from Patient. Allergies lisinopril Gilberto, Shadeed A. ([id number]) Family History Cancer - Maternal Grandparents, Diabetes - Father,Child, Hypertension - Paternal Grandparents, Kidney Disease - Father, Lung Disease - Maternal Grandparents, No family history of Heart Disease, Hereditary Spherocytosis, Seizures, Stroke, Thyroid Problems, Tuberculosis. Social History Never smoker, Marital Status - Separated, Alcohol Use - Rarely, Drug Use - No History, Caffeine Use - Rarely. Medical History Cardiovascular Patient has  history of Hypertension Endocrine Patient has history of Type II Diabetes Neurologic Patient has history of Neuropathy Patient is treated with Insulin, Oral Agents. Blood sugar is tested. Review of Systems (ROS) Constitutional Symptoms (General Health) Complains or has symptoms of Chills. Eyes Complains or has symptoms of Glasses / Contacts. Hematologic/Lymphatic The patient has no complaints or symptoms. Respiratory The patient has no complaints or symptoms. Gastrointestinal The patient has no complaints or symptoms. Genitourinary pt states problems with kidneys Immunological The patient has no complaints or symptoms. Integumentary (Skin) Complains or has symptoms of Wounds, cellulitis and lymphedema Musculoskeletal The patient has no complaints or symptoms. Oncologic The patient has no complaints or symptoms. Psychiatric The patient has no complaints or symptoms. Objective Constitutional Patient is hypertensive.. Pulse regular and within target range for patient.[name] Respirations regular, non-labored and within target range.. Temperature is normal and within the target range for the patient.[name] appears in no distress. Vitals Time Taken: 8:09 AM, Height: 76 in, Source: Stated, Weight: 382.5 lbs, Source: Measured, BMI: 46.6, Temperature: 97.9 F, Pulse: 62 bpm, Respiratory Rate: 20 breaths/min, Blood Pressure: 176/85 mmHg. Will, Renton A. ([id number]) Eyes Conjunctivae clear. No discharge. Respiratory Respiratory effort is easy and symmetric bilaterally. Rate is normal at rest and on room air.[name] respiratory auscultation reveals decreased air entry but no crackles or wheezes. Cardiovascular heart sounds are normal there is no murmurs however his JVP is elevated at 45. femoral pulses are palpable bilaterally. dorsalis pedis pulses are palpable bilaterally however somewhat reduced and difficult to feel through the swelling in his feet. Gastrointestinal (GI) distended but I could  not detect any shifting dullness. No liver or spleen enlargement or tenderness.. Lymphatic none palpable in the popliteal or inguinal area. Psychiatric No evidence of depression, anxiety, or agitation. Calm, cooperative, and communicative. Appropriate interactions and affect.. General Notes: wound exam; the patient has a small healthy well granulated wound on the left upper lateral calf. Small undermining area nothing looks infected. On the left posterior  medial calf is a large wound. There is undermining inferiorly at roughly 10 to 11:00 but not that much. Although there was purulent drainage described by her intake nurse I wasn't convinced there was active surrounding infection. I did culture the undermining area of this but I did not change the antibiotics. There is no surrounding erythema. The patient has severe bilateral left greater than right stage III lymphedema Integumentary (Hair, Skin) severe lymphedema bilaterally left greater than right. Changes of chronic venous insufficiency. There are raised nodules on the right leg skin changes of chronic lymphedema.. Wound #1 status is Open. Original cause of wound was Gradually Appeared. The wound is located on the Left,Proximal,Medial Lower Leg. The wound measures 1.6cm length x 3.2cm width x 0.6cm depth; 4.021cm^2 area and 2.413cm^3 volume. There is Fat Layer (Subcutaneous Tissue) Exposed exposed. There is no tunneling noted, however, there is undermining starting at 12:00 and ending at 12:00 with a maximum distance of 0.3cm. There is a large amount of serosanguineous drainage noted. Foul odor after cleansing was noted. The wound margin is distinct with the outline attached to the wound base. The periwound skin appearance exhibited: Maceration. Periwound temperature was noted as No Abnormality. The periwound has tenderness on palpation. Wound #2 status is Open. Original cause of wound was Gradually Appeared. The wound is located on the  Left,Lateral Lower Leg. The wound measures 4.5cm length x 8cm width x 0.4cm depth; 28.274cm^2 area and 11.31cm^3 volume. There is Fat Layer (Subcutaneous Tissue) Exposed exposed. Tunneling has been noted at 3:00 with a maximum distance of 0.4cm. There is additional tunneling and at 4:00 with a maximum distance of 0.5cm. Undermining begins at 9:00 and ends at 12:00 with a maximum distance of 0.7cm. There is a large amount of purulent drainage noted. Foul odor after cleansing was noted. The wound margin is distinct with the outline attached to the wound base. There is medium (34-66%) red, pink granulation within the wound bed. There is a medium (34-66%) amount of necrotic tissue within the wound bed including Eschar and Adherent Slough. The periwound skin appearance exhibited: Maceration. Periwound temperature was noted as No Abnormality. The periwound has tenderness on palpation. Assessment Active Problems ICD-10 L97.223 - Non-pressure chronic ulcer of left calf with necrosis of muscle I89.0 - Lymphedema, not elsewhere classified Requejo, Jamile A. ([id number]) D66.440 - Chronic venous hypertension (idiopathic) with ulcer and inflammation of left lower extremity E11.622 - Type 2 diabetes mellitus with other skin ulcer Plan Wound Cleansing: Wound #1 Left,Proximal,Medial Lower Leg: Cleanse wound with mild soap and water May shower with protection. - Do not get wraps wet Wound #2 Left,Lateral Lower Leg: Cleanse wound with mild soap and water May shower with protection. - Do not get wraps wet Anesthetic (add to Medication List): Wound #1 Left,Proximal,Medial Lower Leg: Topical Lidocaine 4% cream applied to wound bed prior to debridement (In Clinic Only). Wound #2 Left,Lateral Lower Leg: Topical Lidocaine 4% cream applied to wound bed prior to debridement (In Clinic Only). Primary Wound Dressing: Wound #1 Left,Proximal,Medial Lower Leg: Silver Alginate Wound #2 Left,Lateral Lower Leg: Silver  Alginate Secondary Dressing: Wound #1 Left,Proximal,Medial Lower Leg: ABD pad XtraSorb Wound #2 Left,Lateral Lower Leg: ABD pad XtraSorb Dressing Change Frequency: Wound #1 Left,Proximal,Medial Lower Leg: Change Dressing Monday, Wednesday, Friday Wound #2 Left,Lateral Lower Leg: Change Dressing Monday, Wednesday, Friday Follow-up Appointments: Wound #1 Left,Proximal,Medial Lower Leg: Return Appointment in 1 week. Wound #2 Left,Lateral Lower Leg: Return Appointment in 1 week. Edema Control: Wound #1 Left,Proximal,Medial Lower Leg:  Unna Boot to Left Lower Extremity Patient to wear own compression stockings - on RLE Wound #2 Left,Lateral Lower Leg: Unna Boot to Left Lower Extremity Patient to wear own compression stockings - on RLE Additional Orders / Instructions: Wound #1 Left,Proximal,Medial Lower Leg: Vitamin A; Vitamin C, Zinc - please add a multivitamin with 100% of each of these Increase protein intake. Other: - Please make an appointment with Dr [name] asap Wound #2 Left,Lateral Lower Leg: Vitamin A; Vitamin C, Zinc - please add a multivitamin with 100% of each of these Increase protein intake. LENY, MOROZOV A. ([id number]) Other: - Please make an appointment with Dr [name] asap Home Health: Wound #1 Left,Proximal,Medial Lower Leg: [place] Nurse may visit PRN to address patient s wound care needs. FACE TO FACE ENCOUNTER: MEDICARE and MEDICAID PATIENTS: I certify that this patient is under my care and that I had a face-to-face encounter that meets the physician face-to-face encounter requirements with this patient on this date. The encounter with the patient was in whole or in part for the following MEDICAL CONDITION: (primary reason for [place]) MEDICAL NECESSITY: I certify, that based on my findings, NURSING services are a medically necessary home health service. HOME BOUND STATUS: I certify that my clinical findings support that  this patient is homebound (i.e., Due to illness or injury, pt requires aid of supportive devices such as crutches, cane, wheelchairs, walkers, the use of special transportation or the assistance of another person to leave their place of residence. There is a normal inability to leave the home and doing so requires considerable and taxing effort. Other absences are for medical reasons / religious services and are infrequent or of short duration when for other reasons). If current dressing causes regression in wound condition, may D/C ordered dressing product/s and apply Normal Saline Moist Dressing daily until next [place] / Other MD appointment. [place] of regression in wound condition at [phone]. Please direct any NON-WOUND related issues/requests for orders to patient's Primary Care Physician Wound #2 Left,Lateral Lower Leg: [place] Nurse may visit PRN to address patient s wound care needs. FACE TO FACE ENCOUNTER: MEDICARE and MEDICAID PATIENTS: I certify that this patient is under my care and that I had a face-to-face encounter that meets the physician face-to-face encounter requirements with this patient on this date. The encounter with the patient was in whole or in part for the following MEDICAL CONDITION: (primary reason for [place]) MEDICAL NECESSITY: I certify, that based on my findings, NURSING services are a medically necessary home health service. HOME BOUND STATUS: I certify that my clinical findings support that this patient is homebound (i.e., Due to illness or injury, pt requires aid of supportive devices such as crutches, cane, wheelchairs, walkers, the use of special transportation or the assistance of another person to leave their place of residence. There is a normal inability to leave the home and doing so requires considerable and taxing effort. Other absences are for medical reasons / religious  services and are infrequent or of short duration when for other reasons). If current dressing causes regression in wound condition, may D/C ordered dressing product/s and apply Normal Saline Moist Dressing daily until next [place] / Other MD appointment. [place] of regression in wound condition at [phone]. Please direct any NON-WOUND related issues/requests for orders to patient's Primary Care Physician Laboratory ordered were: Wound culture routine #1 we applied  silver alginate/Karen max/ABDs and put him back in an [employer]. He sees Dr. [name] tomorrow. I think he would probably benefit from 4-layer compression rather than Unna boots. I certainly agree with the external compression pumps. Without external compression pumps I think this man is going to have further skin breakdown in the future. He will also need aggressive compression stockings probably at least 30-40 #2 he has pitting edema in his thighs and I believe this man is fluid overloaded. At one point he was on 80 mg twice a dayof Lasix but is now down to 20 twice a day. I've asked him to contact his nephrologist urgently to look at this. He is already complaining of orthopnea #3 I think if we can get the edema out of his left leg his wounds are probably on the way to healing. We applied silver alginate/absorptive secondary dressings and put him back in an Unna boot although I would like to use for later compression on this instead if Dr. Curley Spice is willing to let us deal with the wounds #4 I did a culture of the large wound on the medial/posterior aspect of left calf but did not alter the Keflex 500 twice a day previously ordered by Dr. Ola Spurr as treatment for the MSSA infection he had in February/19. At that point he had sepsis and severe infection. #5 will see him back next week. Fortunately the wound beds looked good. No debridement was required KACEE, KOREN ([id number]) Electronic  Signature(s) Signed: 12/28/2017 9:58:44 AM By: Linton Ham MD Entered By: Linton Ham on 12/28/2017 09:58:44 Mariani, Saim AMarland Kitchen ([id number]) -------------------------------------------------------------------------------- ROS/PFSH Details Patient Name: Gwynne, Kordae A. Date of Service: 12/28/2017 8:00 AM Medical Record Number: [id number] Patient Account Number: [id number] Date of Birth/Sex: 1970/10/22 (47 y.o. M) Treating RN: Ahmed Prima Primary Care Provider: Gaetano Net Other Clinician: Referring Provider: Referral, Self Treating Provider/Extender: Tito Dine in Treatment: 0 Information Obtained From Patient Wound History Do you currently have one or more open woundso Yes How many open wounds do you currently haveo 2 Approximately how long have you had your woundso 2 months How have you been treating your wound(s) until nowo unna boot Has your wound(s) ever healed and then re-openedo No Have you had any lab work done in the past montho No Have you tested positive for an antibiotic resistant organism (MRSA, VRE)o No Have you tested positive for osteomyelitis (bone infection)o No Have you had any tests for circulation on your legso No Have you had other problems associated with your woundso Swelling Constitutional Symptoms (General Health) Complaints and Symptoms: Positive for: Chills Eyes Complaints and Symptoms: Positive for: Glasses / Contacts Integumentary (Skin) Complaints and Symptoms: Positive for: Wounds Review of System Notes: cellulitis and lymphedema Hematologic/Lymphatic Complaints and Symptoms: No Complaints or Symptoms Respiratory Complaints and Symptoms: No Complaints or Symptoms Cardiovascular Medical History: Positive for: Hypertension Gastrointestinal Campise, Aryaan A. ([id number]) Complaints and Symptoms: No Complaints or Symptoms Endocrine Medical History: Positive for: Type II Diabetes Time with diabetes: 15 yrs Treated with:  Insulin, Oral agents Blood sugar tested every day: Yes Tested : Genitourinary Complaints and Symptoms: Review of System Notes: pt states problems with kidneys Immunological Complaints and Symptoms: No Complaints or Symptoms Musculoskeletal Complaints and Symptoms: No Complaints or Symptoms Neurologic Medical History: Positive for: Neuropathy Oncologic Complaints and Symptoms: No Complaints or Symptoms Psychiatric Complaints and Symptoms: No Complaints or Symptoms Immunizations Pneumococcal Vaccine: Received Pneumococcal Vaccination: Yes Implantable Devices Family and Social History Cancer: Yes - Maternal  Grandparents; Diabetes: Yes - Father,Child; Heart Disease: No; Hereditary Spherocytosis: No; Hypertension: Yes - Paternal Grandparents; Kidney Disease: Yes - Father; Lung Disease: Yes - Maternal Grandparents; Seizures: No; Stroke: No; Thyroid Problems: No; Tuberculosis: No; Never smoker; Marital Status - Separated; Alcohol Use: Rarely; Drug Use: No History; Caffeine Use: Rarely; Financial Concerns: No; Food, Clothing or Shelter Needs: No; Support System Lacking: No; Transportation Concerns: No; Advanced Directives: No; Patient does not want information on Advanced Directives; Do not resuscitate: No; Living Will: No; Medical Power of Attorney: Yes - mother (Not Provided) Electronic Signature(s) KONSTANTINOS, CORDOBA ([id number]) Signed: 12/28/2017 4:27:21 PM By: Alric Quan Signed: 12/28/2017 4:28:35 PM By: Linton Ham MD Entered By: Alric Quan on 12/28/2017 08:22:42 Speegle, Kyrese A. ([id number]) -------------------------------------------------------------------------------- [place] Details Patient Name: Kollmann, Leshawn A. Date of Service: 12/28/2017 Medical Record Number: [id number] Patient Account Number: [id number] Date of Birth/Sex: 05/27/1971 (47 y.o. M) Treating RN: Montey Hora Primary Care Provider: Gaetano Net Other Clinician: Referring Provider: Referral,  Self Treating Provider/Extender: Tito Dine in Treatment: 0 Diagnosis Coding ICD-10 Codes Code Description [phone] Non-pressure chronic ulcer of left calf with necrosis of muscle I89.0 Lymphedema, not elsewhere classified I87.332 Chronic venous hypertension (idiopathic) with ulcer and inflammation of left lower extremity E11.622 Type 2 diabetes mellitus with other skin ulcer E10.22 Type 1 diabetes mellitus with diabetic chronic kidney disease Facility Procedures CPT4 Code: [id number] Description: [place] VISIT-LEV 3 EST PT Modifier: Quantity: 1 CPT4 Code: 68032122 Description: (Facility Use Only) 29580LT - APPLY UNNA BOOT LT Modifier: Quantity: 1 Physician Procedures CPT4: Description Modifier Quantity Code [id number] 70488 - WC PHYS LEVEL 4 - EST PT 1 ICD-10 Diagnosis Description L97.223 Non-pressure chronic ulcer of left calf with necrosis of muscle I89.0 Lymphedema, not elsewhere classified I87.332 Chronic venous  hypertension (idiopathic) with ulcer and inflammation of left lower extremity E11.622 Type 2 diabetes mellitus with other skin ulcer Electronic Signature(s) Signed: 12/28/2017 4:28:35 PM By: Linton Ham MD Entered By: Linton Ham on 12/28/2017 09:56:38

## 2017-12-31 NOTE — Progress Notes (Signed)
[name], [name] ([id number]) Visit Report for 12/28/2017 Abuse/Suicide Risk Screen Details Patient Name: [name], [name] Date of Service: 12/28/2017 8:00 AM Medical Record Number: [id number] Patient Account Number: [id number] Date of Birth/Sex: 10-07-1970 (47 y.o. M) Treating RN: Ahmed Prima Primary Care Rayven Hendrickson: Gaetano Net Other Clinician: Referring Lemoine Goyne: Referral, Self Treating Viveka Wilmeth/Extender: Tito Dine in Treatment: 0 Abuse/Suicide Risk Screen Items Answer ABUSE/SUICIDE RISK SCREEN: Has anyone close to you tried to hurt or harm you recentlyo No Do you feel uncomfortable with anyone in your familyo No Has anyone forced you do things that you didnot want to doo No Do you have any thoughts of harming yourselfo No Patient displays signs or symptoms of abuse and/or neglect. No Electronic Signature(s) Signed: 12/28/2017 4:27:21 PM By: Alric Quan Entered By: Alric Quan on 12/28/2017 08:22:44 [name], [name] AMarland Kitchen ([id number]) -------------------------------------------------------------------------------- Activities of Daily Living Details Patient Name: [name] Date of Service: 12/28/2017 8:00 AM Medical Record Number: [id number] Patient Account Number: [id number] Date of Birth/Sex: Jan 08, 1971 (47 y.o. M) Treating RN: Ahmed Prima Primary Care Dionis Autry: Gaetano Net Other Clinician: Referring Treyton Slimp: Referral, Self Treating Jaren Kearn/Extender: Tito Dine in Treatment: 0 Activities of Daily Living Items Answer Activities of Daily Living (Please select one for each item) Drive Automobile Completely Able Take Medications Completely Able Use Telephone Completely Able Care for Appearance Completely Able Use Toilet Completely Able Bath / Shower Completely Able Dress Self Completely Able Feed Self Completely Able Walk Completely Able Get In / Out Bed Completely Able Housework Completely Able Prepare Meals Completely Able Handle  Money Completely Able Shop for Self Completely Able Electronic Signature(s) Signed: 12/28/2017 4:27:21 PM By: Alric Quan Entered By: Alric Quan on 12/28/2017 08:23:06 [name], [name] ([id number]) -------------------------------------------------------------------------------- Education Assessment Details Patient Name: [name], [name] Date of Service: 12/28/2017 8:00 AM Medical Record Number: [id number] Patient Account Number: [id number] Date of Birth/Sex: May 22, 1971 (47 y.o. M) Treating RN: Ahmed Prima Primary Care Zelphia Glover: Gaetano Net Other Clinician: Referring Lawonda Pretlow: Referral, Self Treating Daine Gunther/Extender: Tito Dine in Treatment: 0 Primary Learner Assessed: Patient Learning Preferences/Education Level/Primary Language Learning Preference: Explanation, Printed Material Highest Education Level: College or Above Preferred Language: [name] Cognitive Barrier Assessment/Beliefs Language Barrier: No Translator Needed: No Memory Deficit: No Emotional Barrier: No Cultural/Religious Beliefs Affecting Medical Care: No Physical Barrier Assessment Impaired Vision: Yes Glasses Impaired Hearing: No Decreased Hand dexterity: No Knowledge/Comprehension Assessment Knowledge Level: Medium Comprehension Level: Medium Ability to understand written Medium instructions: Ability to understand verbal Medium instructions: Motivation Assessment Anxiety Level: Calm Cooperation: Cooperative Education Importance: Acknowledges Need Interest in Health Problems: Asks Questions Perception: Coherent Willingness to Engage in Self- Medium Management Activities: Readiness to Engage in Self- Medium Management Activities: Electronic Signature(s) Signed: 12/28/2017 4:27:21 PM By: Alric Quan Entered By: Alric Quan on 12/28/2017 08:23:25 [name], [name] AMarland Kitchen ([id number]) -------------------------------------------------------------------------------- Fall Risk  Assessment Details Patient Name: [name], [name] Date of Service: 12/28/2017 8:00 AM Medical Record Number: [id number] Patient Account Number: [id number] Date of Birth/Sex: Jul 29, 1971 (47 y.o. M) Treating RN: Ahmed Prima Primary Care Kemba Hoppes: Gaetano Net Other Clinician: Referring Ralston Venus: Referral, Self Treating Gustava Berland/Extender: Tito Dine in Treatment: 0 Fall Risk Assessment Items Have you had 2 or more falls in the last 12 monthso 0 No Have you had any fall that resulted in injury in the last 12 monthso 0 No FALL RISK ASSESSMENT: History of falling - immediate or within 3 months 0 No Secondary diagnosis 0 No Ambulatory aid None/bed rest/wheelchair/nurse 0 No Crutches/cane/walker 0  No Furniture 0 No IV Access/Saline Lock 0 No Gait/Training Normal/bed rest/immobile 0 No Weak 0 No Impaired 0 No Mental Status Oriented to own ability 0 Yes Electronic Signature(s) Signed: 12/28/2017 4:27:21 PM By: Alric Quan Entered By: Alric Quan on 12/28/2017 08:23:35 [name], [name] ([id number]) -------------------------------------------------------------------------------- Foot Assessment Details Patient Name: [name], [name] Date of Service: 12/28/2017 8:00 AM Medical Record Number: [id number] Patient Account Number: [id number] Date of Birth/Sex: 01-Jul-1971 (47 y.o. M) Treating RN: Ahmed Prima Primary Care Caitlan Chauca: Gaetano Net Other Clinician: Referring Shanequia Kendrick: Referral, Self Treating Paulene Tayag/Extender: Tito Dine in Treatment: 0 Foot Assessment Items Site Locations + = Sensation present, - = Sensation absent, C = Callus, U = Ulcer R = Redness, W = Warmth, M = Maceration, PU = Pre-ulcerative lesion F = Fissure, S = Swelling, D = Dryness Assessment Right: Left: Other Deformity: No No Prior Foot Ulcer: No No Prior Amputation: No No Charcot Joint: No No Ambulatory Status: Ambulatory Without Help Gait: Steady Electronic  Signature(s) Signed: 12/28/2017 4:27:21 PM By: Alric Quan Entered By: Alric Quan on 12/28/2017 08:27:11 [name], [name] ([id number]) -------------------------------------------------------------------------------- Nutrition Risk Assessment Details Patient Name: [name], [name] Date of Service: 12/28/2017 8:00 AM Medical Record Number: [id number] Patient Account Number: [id number] Date of Birth/Sex: 1970-08-31 (47 y.o. M) Treating RN: Ahmed Prima Primary Care Kewanda [place]: Gaetano Net Other Clinician: Referring Ernisha Sorn: Referral, Self Treating Innocence Schlotzhauer/Extender: Tito Dine in Treatment: 0 Height (in): 76 Weight (lbs): 382.5 Body Mass Index (BMI): 46.6 Nutrition Risk Assessment Items NUTRITION RISK SCREEN: I have an illness or condition that made me change the kind and/or amount of 0 No food I eat I eat fewer than two meals per day 3 Yes I eat few fruits and vegetables, or milk products 0 No I have three or more drinks of beer, liquor or wine almost every day 0 No I have tooth or mouth problems that make it hard for me to eat 0 No I don't always have enough money to buy the food I need 0 No I eat alone most of the time 0 No I take three or more different prescribed or over-the-counter drugs [name] day 1 Yes Without wanting to, I have lost or gained 10 pounds in the last six months 0 No I am not always physically able to shop, cook and/or feed myself 0 No Nutrition Protocols Good Risk Protocol Moderate Risk Protocol Electronic Signature(s) Signed: 12/28/2017 4:27:21 PM By: Alric Quan Entered By: Alric Quan on 12/28/2017 08:23:48

## 2018-01-01 NOTE — Progress Notes (Signed)
AVEN, CHRISTEN ([id number]) Visit Report for 12/28/2017 Allergy List Details Patient Name: Caseres, Haldon A. Date of Service: 12/28/2017 8:00 AM Medical Record Number: [id number] Patient Account Number: [id number] Date of Birth/Sex: 06/30/1971 (47 y.o. M) Treating RN: Ahmed Prima Primary Care Mikhala Kenan: Gaetano Net Other Clinician: Referring Tobiah Celestine: Referral, Self Treating Nial Hawe/Extender: Ricard Dillon Weeks in Treatment: 0 Allergies Active Allergies lisinopril Allergy Notes Electronic Signature(s) Signed: 12/28/2017 4:27:21 PM By: Alric Quan Entered By: Alric Quan on 12/28/2017 08:12:54 Hodzic, Elchonon A. ([id number]) -------------------------------------------------------------------------------- Arrival Information Details Patient Name: Meraz, Doyal A. Date of Service: 12/28/2017 8:00 AM Medical Record Number: [id number] Patient Account Number: [id number] Date of Birth/Sex: 1971-03-18 (47 y.o. M) Treating RN: Ahmed Prima Primary Care Bobi Daudelin: Gaetano Net Other Clinician: Referring Makaiya Geerdes: Referral, Self Treating Meggen Spaziani/Extender: Tito Dine in Treatment: 0 Visit Information Patient Arrived: Ambulatory Arrival Time: 08:08 Accompanied By: mother Transfer Assistance: None Patient Identification Verified: Yes Secondary Verification Process Yes Completed: Patient Requires Transmission-Based No Precautions: Patient Has Alerts: Yes Patient Alerts: DM II noncompressible bilateral Electronic Signature(s) Signed: 12/28/2017 4:27:21 PM By: Alric Quan Entered By: Alric Quan on 12/28/2017 08:41:46 Borrelli, Khi A. ([id number]) -------------------------------------------------------------------------------- Clinic Level of Care Assessment Details Patient Name: Pracht, Victormanuel A. Date of Service: 12/28/2017 8:00 AM Medical Record Number: [id number] Patient Account Number: [id number] Date of Birth/Sex: [birth year] (47 y.o. M) Treating RN:  Montey Hora Primary Care Tasmin Exantus: Gaetano Net Other Clinician: Referring Shaquill Iseman: Referral, Self Treating Raniya Golembeski/Extender: Tito Dine in Treatment: 0 Clinic Level of Care Assessment Items TOOL 1 Quantity Score []  - Use when EandM and Procedure is performed on INITIAL visit 0 ASSESSMENTS - Nursing Assessment / Reassessment X - General Physical Exam (combine w/ comprehensive assessment (listed just below) when 1 20 performed on new pt. evals) X- 1 25 Comprehensive Assessment (HX, ROS, Risk Assessments, Wounds Hx, etc.) ASSESSMENTS - Wound and Skin Assessment / Reassessment []  - Dermatologic / Skin Assessment (not related to wound area) 0 ASSESSMENTS - Ostomy and/or Continence Assessment and Care []  - Incontinence Assessment and Management 0 []  - 0 Ostomy Care Assessment and Management (repouching, etc.) PROCESS - Coordination of Care X - Simple Patient / Family Education for ongoing care 1 15 []  - 0 Complex (extensive) Patient / Family Education for ongoing care X- 1 10 Staff obtains Programmer, systems, Records, Test Results / Process Orders []  - 0 Staff telephones HHA, Nursing Homes / Clarify orders / etc []  - 0 Routine Transfer to another Facility (non-emergent condition) []  - 0 Routine Hospital Admission (non-emergent condition) X- 1 15 New Admissions / Biomedical engineer / Ordering NPWT, Apligraf, etc. []  - 0 Emergency Hospital Admission (emergent condition) PROCESS - Special Needs []  - Pediatric / Minor Patient Management 0 []  - 0 Isolation Patient Management []  - 0 Hearing / Language / Visual special needs []  - 0 Assessment of Community assistance (transportation, D/C planning, etc.) []  - 0 Additional assistance / Altered mentation []  - 0 Support Surface(s) Assessment (bed, cushion, seat, etc.) Bunney, Lakai A. ([id number]) INTERVENTIONS - Miscellaneous []  - External ear exam 0 []  - 0 Patient Transfer (multiple staff / Civil Service fast streamer / Similar  devices) []  - 0 Simple Staple / Suture removal (25 or less) []  - 0 Complex Staple / Suture removal (26 or more) []  - 0 Hypo/Hyperglycemic Management (do not check if billed separately) X- 1 15 Ankle / Brachial Index (ABI) - do not check if billed separately Has the patient been seen at the hospital within the last three  years: Yes Total Score: 100 Level Of Care: New/Established - Level 3 Electronic Signature(s) Signed: 12/28/2017 4:48:19 PM By: Montey Hora Entered By: Montey Hora on 12/28/2017 09:21:20 Bergeron, Donovon A. ([id number]) -------------------------------------------------------------------------------- Compression Therapy Details Patient Name: Olvey, Hogan A. Date of Service: 12/28/2017 8:00 AM Medical Record Number: [id number] Patient Account Number: [id number] Date of Birth/Sex: 1971/01/16 (47 y.o. M) Treating RN: Montey Hora Primary Care Soren Pigman: Gaetano Net Other Clinician: Referring Belal Scallon: Referral, Self Treating Emara Lichter/Extender: Tito Dine in Treatment: 0 Compression Therapy Performed for Wound Assessment: Wound #1 Left,Proximal,Medial Lower Leg Performed By: Clinician Montey Hora, RN Compression Type: Rolena Infante Post Procedure Diagnosis Same as Pre-procedure Notes patient has been seeing [facility] and tolerating unna boots for about 3 months Electronic Signature(s) Signed: 12/28/2017 4:48:19 PM By: Montey Hora Entered By: Montey Hora on 12/28/2017 10:08:44 Gwilliam, Shirl A. ([id number]) -------------------------------------------------------------------------------- Compression Therapy Details Patient Name: Felker, Leah A. Date of Service: 12/28/2017 8:00 AM Medical Record Number: [id number] Patient Account Number: [id number] Date of Birth/Sex: [birth year] (46 y.o. M) Treating RN: Montey Hora Primary Care Zaven Klemens: Gaetano Net Other Clinician: Referring Kalena Mander: Referral, Self Treating Paxtyn Boyar/Extender: Tito Dine in  Treatment: 0 Compression Therapy Performed for Wound Assessment: Wound #2 Left,Lateral Lower Leg Performed By: Clinician Montey Hora, RN Compression Type: Rolena Infante Post Procedure Diagnosis Same as Pre-procedure Notes patient has been seeing [facility] and tolerating unna boots for about 3 months Electronic Signature(s) Signed: 12/28/2017 4:48:19 PM By: Montey Hora Entered By: Montey Hora on 12/28/2017 10:08:45 Flannigan, Khiyan A. ([id number]) -------------------------------------------------------------------------------- Encounter Discharge Information Details Patient Name: Tsui, Yonah A. Date of Service: 12/28/2017 8:00 AM Medical Record Number: [id number] Patient Account Number: [id number] Date of Birth/Sex: [birth year] (47 y.o. M) Treating RN: Montey Hora Primary Care Deshon Koslowski: Gaetano Net Other Clinician: Referring Kacy Conely: Referral, Self Treating Loa Idler/Extender: Tito Dine in Treatment: 0 Encounter Discharge Information Items Schedule Follow-up Appointment: No Medication Reconciliation completed and No provided to Patient/Care Berdie Malter: Provided on Clinical Summary of Care: 12/28/2017 Form Type Recipient Paper Patient [facility] Electronic Signature(s) Signed: 12/29/2017 2:11:15 PM By: Ruthine Dose Entered By: Ruthine Dose on 12/28/2017 09:47:00 Parkhurst, Mizael A. ([id number]) -------------------------------------------------------------------------------- Lower Extremity Assessment Details Patient Name: Cheuvront, Kelen A. Date of Service: 12/28/2017 8:00 AM Medical Record Number: [id number] Patient Account Number: [id number] Date of Birth/Sex: [birth year] (47 y.o. M) Treating RN: Ahmed Prima Primary Care Sloan Galentine: Gaetano Net Other Clinician: Referring Niang Mitcheltree: Referral, Self Treating Aulton Routt/Extender: Tito Dine in Treatment: 0 Edema Assessment Assessed: [Left: No] [Right: No] Edema: [Left: Yes] [Right: Yes] Calf Left: Right: Point of  Measurement: 42 cm From Medial Instep 60.5 cm 53 cm Ankle Left: Right: Point of Measurement: 12 cm From Medial Instep 47.6 cm 37 cm Vascular Assessment Pulses: Dorsalis Pedis Palpable: [Left:No] [Right:No] Doppler Audible: [Left:Yes] [Right:Yes] Posterior Tibial Palpable: [Left:No] [Right:No] Extremity colors, hair growth, and conditions: Extremity Color: [Left:Hyperpigmented] [Right:Red] Hair Growth on Extremity: [Left:Yes] [Right:Yes] Temperature of Extremity: [Left:Warm] [Right:Warm] Capillary Refill: [Left:< 3 seconds] [Right:< 3 seconds] Toe Nail Assessment Left: Right: Thick: Yes Yes Discolored: Yes Yes Deformed: No No Improper Length and Hygiene: Yes Yes Notes non-compressible bilateral Electronic Signature(s) Signed: 12/28/2017 4:27:21 PM By: Alric Quan Entered By: Alric Quan on 12/28/2017 08:42:49 Wertenberger, Milton A. ([id number]) -------------------------------------------------------------------------------- Multi Wound Chart Details Patient Name: Ryder, Shoichi A. Date of Service: 12/28/2017 8:00 AM Medical Record Number: [id number] Patient Account Number: [id number] Date of Birth/Sex: [birth year] (47 y.o. M) Treating RN: Montey Hora Primary Care Fraya Ueda: Gaetano Net Other Clinician: Referring Kein Carlberg: Referral,  Self Treating Keari Miu/Extender: Ricard Dillon Weeks in Treatment: 0 Vital Signs Height(in): 76 Pulse(bpm): 28 Weight(lbs): 382.5 Blood Pressure(mmHg): 176/85 Body Mass Index(BMI): 47 Temperature(F): 97.9 Respiratory Rate 20 (breaths/min): Photos: [N/A:N/A] Wound Location: Left Lower Leg - Medial, Left Lower Leg - Lateral N/A Proximal Wounding Event: Gradually Appeared Gradually Appeared N/A Primary Etiology: Diabetic Wound/Ulcer of the Diabetic Wound/Ulcer of the N/A Lower Extremity Lower Extremity Secondary Etiology: Lymphedema Lymphedema N/A Comorbid History: Hypertension, Type II Hypertension, Type II N/A Diabetes, Neuropathy  Diabetes, Neuropathy Date Acquired: 09/30/2017 09/30/2017 N/A Weeks of Treatment: 0 0 N/A Wound Status: Open Open N/A Measurements L x W x D 1.6x3.2x0.6 4.5x8x0.4 N/A (cm) Area (cm) : 4.021 28.274 N/A Volume (cm) : 2.413 11.31 N/A % Reduction in Area: 0.00% N/A N/A % Reduction in Volume: 0.00% N/A N/A Position 1 (o'clock): 3 Maximum Distance 1 (cm): 0.4 Position 2 (o'clock): 4 Maximum Distance 2 (cm): 0.5 Starting Position 1 12 9  (o'clock): Ending Position 1 12 12  (o'clock): Maximum Distance 1 (cm): 0.3 0.7 Tunneling: No Yes N/A Undermining: Yes Yes N/A Classification: Grade 2 Grade 2 N/A Exudate Amount: Large Large N/A Exudate Type: Serosanguineous Purulent N/A Francois, Melesio A. ([id number]) Exudate Color: red, brown yellow, brown, green N/A Foul Odor After Cleansing: Yes Yes N/A Odor Anticipated Due to No No N/A Product Use: Wound Margin: Distinct, outline attached Distinct, outline attached N/A Granulation Amount: N/A Medium (34-66%) N/A Granulation Quality: N/A Red, Pink N/A Necrotic Amount: N/A Medium (34-66%) N/A Necrotic Tissue: N/A Eschar, Adherent Slough N/A Exposed Structures: Fat Layer (Subcutaneous Fat Layer (Subcutaneous N/A Tissue) Exposed: Yes Tissue) Exposed: Yes Epithelialization: None None N/A Periwound Skin Texture: No Abnormalities Noted No Abnormalities Noted N/A Periwound Skin Moisture: Maceration: Yes Maceration: Yes N/A Periwound Skin Color: No Abnormalities Noted No Abnormalities Noted N/A Temperature: No Abnormality No Abnormality N/A Tenderness on Palpation: Yes Yes N/A Wound Preparation: Ulcer Cleansing: Ulcer Cleansing: N/A Rinsed/Irrigated with Saline, Rinsed/Irrigated with Saline, Other: soap and water Other: soap and water Topical Anesthetic Applied: Topical Anesthetic Applied: Other: lidocaine 4% Other: lidocaine 4% Treatment Notes Electronic Signature(s) Signed: 12/28/2017 4:28:35 PM By: Linton Ham MD Entered By: Linton Ham on 12/28/2017 09:28:10 Cathers, Justis AMarland Kitchen ([id number]) -------------------------------------------------------------------------------- Multi-Disciplinary Care Plan Details Patient Name: Fails, Vladislav A. Date of Service: 12/28/2017 8:00 AM Medical Record Number: [id number] Patient Account Number: [id number] Date of Birth/Sex: 1971/08/14 (47 y.o. M) Treating RN: Montey Hora Primary Care Jaxsin Bottomley: Gaetano Net Other Clinician: Referring Albertine Lafoy: Referral, Self Treating Sarahi Borland/Extender: Tito Dine in Treatment: 0 Active Inactive ` Abuse / Safety / Falls / Self Care Management Nursing Diagnoses: Potential for falls Goals: Patient will remain injury free related to falls Date Initiated: 12/28/2017 Target Resolution Date: 04/08/2018 Goal Status: Active Interventions: Assess fall risk on admission and as needed Notes: ` Nutrition Nursing Diagnoses: Impaired glucose control: actual or potential Goals: Patient/caregiver verbalizes understanding of need to maintain therapeutic glucose control per primary care physician Date Initiated: 12/28/2017 Target Resolution Date: 04/08/2018 Goal Status: Active Interventions: Provide education on elevated blood sugars and impact on wound healing Notes: ` Orientation to the Wound Care Program Nursing Diagnoses: Knowledge deficit related to the wound healing center program Goals: Patient/caregiver will verbalize understanding of the [place] Program Date Initiated: 12/28/2017 Target Resolution Date: 04/08/2018 Goal Status: Active Interventions: Rosato, Hicks A. ([id number]) Provide education on orientation to the wound center Notes: ` Wound/Skin Impairment Nursing Diagnoses: Impaired tissue integrity Goals: Ulcer/skin breakdown will heal within 14 weeks Date Initiated: 12/28/2017 Target  Resolution Date: 04/08/2018 Goal Status: Active Interventions: Assess patient/caregiver ability to obtain necessary  supplies Assess patient/caregiver ability to perform ulcer/skin care regimen upon admission and as needed Assess ulceration(s) every visit Notes: Electronic Signature(s) Signed: 12/28/2017 4:48:19 PM By: Montey Hora Entered By: Montey Hora on 12/28/2017 09:02:48 Canales, Romy A. ([id number]) -------------------------------------------------------------------------------- Pain Assessment Details Patient Name: Sacra, Dandy A. Date of Service: 12/28/2017 8:00 AM Medical Record Number: [id number] Patient Account Number: [id number] Date of Birth/Sex: 1970/09/02 (47 y.o. M) Treating RN: Ahmed Prima Primary Care Casara Perrier: Gaetano Net Other Clinician: Referring Casilda Pickerill: Referral, Self Treating Evi Mccomb/Extender: Tito Dine in Treatment: 0 Active Problems Location of Pain Severity and Description of Pain Patient Has Paino No Site Locations Pain Management and Medication Current Pain Management: Electronic Signature(s) Signed: 12/28/2017 4:27:21 PM By: Alric Quan Entered By: Alric Quan on 12/28/2017 08:09:16 Hislop, Alban A. ([id number]) -------------------------------------------------------------------------------- Wound Assessment Details Patient Name: Kerth, Carnie A. Date of Service: 12/28/2017 8:00 AM Medical Record Number: [id number] Patient Account Number: [id number] Date of Birth/Sex: [birth year] (47 y.o. M) Treating RN: Ahmed Prima Primary Care Duaa Stelzner: Gaetano Net Other Clinician: Referring Estel Tonelli: Referral, Self Treating Phill Steck/Extender: Tito Dine in Treatment: 0 Wound Status Wound Number: 1 Primary Etiology: Diabetic Wound/Ulcer of the Lower Extremity Wound Location: Left Lower Leg - Medial, Proximal Secondary Lymphedema Wounding Event: Gradually Appeared Etiology: Date Acquired: 09/30/2017 Wound Status: Open Weeks Of Treatment: 0 Comorbid History: Hypertension, Type II Diabetes, Clustered Wound:  No Neuropathy Photos Photo Uploaded By: Alric Quan on 12/28/2017 09:06:22 Wound Measurements Length: (cm) 1.6 Width: (cm) 3.2 Depth: (cm) 0.6 Area: (cm) 4.021 Volume: (cm) 2.413 % Reduction in Area: 0% % Reduction in Volume: 0% Epithelialization: None Tunneling: No Undermining: Yes Starting Position (o'clock): 12 Ending Position (o'clock): 12 Maximum Distance: (cm) 0.3 Wound Description Classification: Grade 2 Wound Margin: Distinct, outline attached Exudate Amount: Large Exudate Type: Serosanguineous Exudate Color: red, brown Foul Odor After Cleansing: Yes Due to Product Use: No Slough/Fibrino Yes Wound Bed Exposed Structure Fat Layer (Subcutaneous Tissue) Exposed: Yes Periwound Skin Texture Texture Color No Abnormalities Noted: No No Abnormalities Noted: No Moisture Temperature / Pain Welchel, Eluterio A. ([id number]) No Abnormalities Noted: No Temperature: No Abnormality Maceration: Yes Tenderness on Palpation: Yes Wound Preparation Ulcer Cleansing: Rinsed/Irrigated with Saline, Other: soap and water, Topical Anesthetic Applied: Other: lidocaine 4%, Electronic Signature(s) Signed: 12/28/2017 4:27:21 PM By: Alric Quan Entered By: Alric Quan on 12/28/2017 08:48:04 Klippel, Rustin A. ([id number]) -------------------------------------------------------------------------------- Wound Assessment Details Patient Name: Dorminey, Khrystian A. Date of Service: 12/28/2017 8:00 AM Medical Record Number: [id number] Patient Account Number: [id number] Date of Birth/Sex: [birth year] (47 y.o. M) Treating RN: Ahmed Prima Primary Care Alexsys Eskin: Gaetano Net Other Clinician: Referring Mabry Tift: Referral, Self Treating Minahil Quinlivan/Extender: Tito Dine in Treatment: 0 Wound Status Wound Number: 2 Primary Etiology: Diabetic Wound/Ulcer of the Lower Extremity Wound Location: Left Lower Leg - Lateral Secondary Lymphedema Wounding Event: Gradually  Appeared Etiology: Date Acquired: 09/30/2017 Wound Status: Open Weeks Of Treatment: 0 Comorbid History: Hypertension, Type II Diabetes, Clustered Wound: No Neuropathy Photos Photo Uploaded By: Alric Quan on 12/28/2017 09:06:22 Wound Measurements Length: (cm) 4.5 Width: (cm) 8 Depth: (cm) 0.4 Area: (cm) 28.274 Volume: (cm) 11.31 % Reduction in Area: % Reduction in Volume: Epithelialization: None Tunneling: Yes Location 1 Position (o'clock): 3 Maximum Distance: (cm) 0.4 Location 2 Position (o'clock): 4 Maximum Distance: (cm) 0.5 Undermining: Yes Starting Position (o'clock): 9 Ending Position (o'clock): 12 Maximum Distance: (cm) 0.7 Wound Description Classification: Grade 2 Wound Margin: Distinct,  outline attached Exudate Amount: Large Exudate Type: Purulent Exudate Color: yellow, brown, green Foul Odor After Cleansing: Yes Due to Product Use: No Slough/Fibrino Yes Wound Bed Wos, Mansoor A. ([id number]) Granulation Amount: Medium (34-66%) Exposed Structure Granulation Quality: Red, Pink Fat Layer (Subcutaneous Tissue) Exposed: Yes Necrotic Amount: Medium (34-66%) Necrotic Quality: Eschar, Adherent Slough Periwound Skin Texture Texture Color No Abnormalities Noted: No No Abnormalities Noted: No Moisture Temperature / Pain No Abnormalities Noted: No Temperature: No Abnormality Maceration: Yes Tenderness on Palpation: Yes Wound Preparation Ulcer Cleansing: Rinsed/Irrigated with Saline, Other: soap and water, Topical Anesthetic Applied: Other: lidocaine 4%, Electronic Signature(s) Signed: 12/28/2017 4:27:21 PM By: Alric Quan Entered By: Alric Quan on 12/28/2017 08:53:07 Selinger, Logyn A. ([id number]) -------------------------------------------------------------------------------- Vitals Details Patient Name: Yarde, Yaakov A. Date of Service: 12/28/2017 8:00 AM Medical Record Number: [id number] Patient Account Number: [id number] Date of  Birth/Sex: [birth year] (47 y.o. M) Treating RN: Ahmed Prima Primary Care Twanisha Foulk: Gaetano Net Other Clinician: Referring Aryiana Klinkner: Referral, Self Treating Lancelot Alyea/Extender: Tito Dine in Treatment: 0 Vital Signs Time Taken: 08:09 Temperature (F): 97.9 Height (in): 76 Pulse (bpm): 62 Source: Stated Respiratory Rate (breaths/min): 20 Weight (lbs): 382.5 Blood Pressure (mmHg): 176/85 Source: Measured Reference Range: 80 - 120 mg / dl Body Mass Index (BMI): 46.6 Electronic Signature(s) Signed: 12/28/2017 4:27:21 PM By: Alric Quan Entered By: Alric Quan on 12/28/2017 08:12:34

## 2018-01-04 ENCOUNTER — Encounter: Payer: BLUE CROSS/BLUE SHIELD | Admitting: Internal Medicine

## 2018-01-04 DIAGNOSIS — E11622 Type 2 diabetes mellitus with other skin ulcer: Secondary | ICD-10-CM | POA: Diagnosis not present

## 2018-01-07 NOTE — Progress Notes (Signed)
KOREE, SCHOPF ([id number]) Visit Report for 01/04/2018 HPI Details Patient Name: [name], [name] Date of Service: 01/04/2018 8:45 AM Medical Record Number: [id number] Patient Account Number: [id number] Date of Birth/Sex: November 10, 1970 (47 y.o. M) Treating RN: Cornell Barman Primary Care Provider: Gaetano Net Other Clinician: Referring Provider: Gaetano Net Treating Provider/Extender: Tito Dine in Treatment: 1 History of Present Illness HPI Description: 12/28/17; this is a now 47 year old man who is a type II diabetic. He was hospitalized from 10/01/17 through 10/19/17. He had an MSSA soft tissue and skin infection. 2 open areas on the left leg were identified he has a smaller area on the left medial calf superiorly just below the knee and a wound just above the left ankle on the posterior medial aspect. I think both of these were surgical IandD sites when he was in the hospital. He was discharged with a wound VAC at that point however this is since been taken off. He follows with Dr. Ola Spurr for the [facility] and he is still on chronic Keflex at 500 twice a day. At that time he was hospitalized his hemoglobin A1c was 15.1 however if I'm reading his endocrinologist notes correctly that is improved. He has been following with Dr[name] [facility] and he has been applying calcium alginate and Unna boots. He has home health changing the dressing. They have also been attempting to get him external compression pumps although the patient is unaware whether they've been approved by insurance at this point. as mentioned he has a smaller clean wound on the right lateral calf just below the knee and he has a much larger area just above the left ankle medially and posteriorly. Our intake nurse reported greenish purulent looking drainage.the patient did have surgical material sent to pathology in February. This showed chronic abscess The patient also has lymphedema stage III in the left  greater than right lower extremities. He has a history of blisters with wounds but these of all were always healed. The patient thinks that the lymphedema may have been present since he was about 47 years old i.e. about 30 years. He does not have graded pressure stockings and has not worn stockings. He does not have a distant history of DVT PE or phlebitis. He has not been systemically unwell fever no chills. He states that his Lasix is recently been reduced. He tells me his kidney function is at "30%" and he has been followed by Dr. [name] of nephrology. At one point he was on Lasix 80 twice a day however that's been cut back and he is now on Lasix at 20 twice a day. The patient has a history of PAD listed in his records although he comes from Dr. [name] I don't think is felt to have significant PAD. ABIs in our clinic were noncompressible bilaterally. 01/04/18; patient has a large wound on the left lateral lower calf and a small wound on the left medial upper calf. He has been to see his nephrologist who changed him to Demadex 40 mg a day. I'm hopeful this will help with his systemic fluid overload. He also has stage III lymphedema. Really no change in the 2 wounds since last week Electronic Signature(s) Signed: 01/05/2018 1:16:01 PM By: Linton Ham MD Entered By: Linton Ham on 01/04/2018 10:25:10 Masella, Laurens A. ([id number]) -------------------------------------------------------------------------------- Physical Exam Details Patient Name: [name], [name] Date of Service: 01/04/2018 8:45 AM Medical Record Number: [id number] Patient Account Number: [id number] Date of Birth/Sex: 06-Sep-1970 (47 y.o.  M) Treating RN: Cornell Barman Primary Care Provider: Gaetano Net Other Clinician: Referring Provider: Gaetano Net Treating Provider/Extender: Tito Dine in Treatment: 1 Constitutional Patient is hypertensive.. Pulse regular and within target range for patient.[name] Respirations  regular, non-labored and within target range.. Temperature is normal and within the target range for the patient.[name] appears in no distress. Respiratory Respiratory effort is easy and symmetric bilaterally. Rate is normal at rest and on room air.. Bilateral breath sounds are clear and equal in all lobes with no wheezes, rales or rhonchi.. Cardiovascular Heart rhythm and rate regular, without murmur or gallop.JVP is still elevated at 45o. think his dorsalis pedis pulses are palpable bilaterally although there are faint. severe left greater than right lymphedema bilaterally.. Gastrointestinal (GI) obese but not particularly distended. No shifting dullness. Psychiatric No evidence of depression, anxiety, or agitation. Calm, cooperative, and communicative. Appropriate interactions and affect.. Notes wound exam oThe patient has a small healthy slitlike wound on the left upper medial calf. On the left posterior medial calf distally is a large open wound. I cultured this last week the culture was negative. There is no surrounding erythema. Electronic Signature(s) Signed: 01/05/2018 1:16:01 PM By: Linton Ham MD Entered By: Linton Ham on 01/04/2018 10:24:28 [name], [name] ([id number]) -------------------------------------------------------------------------------- Physician Orders Details Patient Name: [name], [name] Date of Service: 01/04/2018 8:45 AM Medical Record Number: [id number] Patient Account Number: [id number] Date of Birth/Sex: 03-Dec-1970 (47 y.o. M) Treating RN: Cornell Barman Primary Care Provider: Gaetano Net Other Clinician: Referring Provider: Gaetano Net Treating Provider/Extender: Tito Dine in Treatment: 1 Verbal / Phone Orders: No Diagnosis Coding Wound Cleansing Wound #1 Left,Proximal,Medial Lower Leg o Clean wound with Normal Saline. Wound #2 Left,Lateral Lower Leg o Clean wound with Normal Saline. Anesthetic (add to Medication List) Wound #1  Left,Proximal,Medial Lower Leg o Topical Lidocaine 4% cream applied to wound bed prior to debridement (In Clinic Only). Wound #2 Left,Lateral Lower Leg o Topical Lidocaine 4% cream applied to wound bed prior to debridement (In Clinic Only). Skin Barriers/Peri-Wound Care Wound #1 Left,Proximal,Medial Lower Leg o Barrier cream Wound #2 Left,Lateral Lower Leg o Barrier cream Primary Wound Dressing Wound #1 Left,Proximal,Medial Lower Leg o Silver Alginate Wound #2 Left,Lateral Lower Leg o Silver Alginate Secondary Dressing Wound #1 Left,Proximal,Medial Lower Leg o ABD pad o XtraSorb Wound #2 Left,Lateral Lower Leg o ABD pad o XtraSorb Dressing Change Frequency Wound #1 Left,Proximal,Medial Lower Leg o Change dressing every week Wound #2 Left,Lateral Lower Leg o Change dressing every week Davlin, Damek A. ([id number]) Follow-up Appointments Wound #1 Left,Proximal,Medial Lower Leg o Return Appointment in 1 week. Wound #2 Left,Lateral Lower Leg o Return Appointment in 1 week. Edema Control Wound #1 Left,Proximal,Medial Lower Leg o 4-Layer Compression System - Left Lower Extremity Wound #2 Left,Lateral Lower Leg o 4-Layer Compression System - Left Lower Extremity Home Health Wound #1 Left,Proximal,Medial Lower Leg o Continue Home Health Visits o Home Health Nurse may visit PRN to address patientos wound care needs. o FACE TO FACE ENCOUNTER: MEDICARE and MEDICAID PATIENTS: I certify that this patient is under my care and that I had a face-to-face encounter that meets the physician face-to-face encounter requirements with this patient on this date. The encounter with the patient was in whole or in part for the following MEDICAL CONDITION: (primary reason for [place]) MEDICAL NECESSITY: I certify, that based on my findings, NURSING services are a medically necessary home health service. HOME BOUND STATUS: I certify that my clinical  findings support  that this patient is homebound (i.e., Due to illness or injury, pt requires aid of supportive devices such as crutches, cane, wheelchairs, walkers, the use of special transportation or the assistance of another person to leave their place of residence. There is a normal inability to leave the home and doing so requires considerable and taxing effort. Other absences are for medical reasons / religious services and are infrequent or of short duration when for other reasons). o If current dressing causes regression in wound condition, may D/C ordered dressing product/s and apply Normal Saline Moist Dressing daily until next [place] / Other MD appointment. [place] of regression in wound condition at [phone]. Wound #2 Left,Lateral Lower Leg o [place] Nurse may visit PRN to address patientos wound care needs. o FACE TO FACE ENCOUNTER: MEDICARE and MEDICAID PATIENTS: I certify that this patient is under my care and that I had a face-to-face encounter that meets the physician face-to-face encounter requirements with this patient on this date. The encounter with the patient was in whole or in part for the following MEDICAL CONDITION: (primary reason for [place]) MEDICAL NECESSITY: I certify, that based on my findings, NURSING services are a medically necessary home health service. HOME BOUND STATUS: I certify that my clinical findings support that this patient is homebound (i.e., Due to illness or injury, pt requires aid of supportive devices such as crutches, cane, wheelchairs, walkers, the use of special transportation or the assistance of another person to leave their place of residence. There is a normal inability to leave the home and doing so requires considerable and taxing effort. Other absences are for medical reasons / religious services and are infrequent or of short duration when for other  reasons). o If current dressing causes regression in wound condition, may D/C ordered dressing product/s and apply Normal Saline Moist Dressing daily until next [place] / Other MD appointment. [place] of regression in wound condition at [phone]. Medications-please add to medication list. Wound #1 Left,Proximal,Medial Lower Leg o P.O. Antibiotics - Continue antibiotics Wound #2 Left,Lateral Lower Leg o P.O. Antibiotics - Continue antibiotics [name], [name] ([id number]) Electronic Signature(s) Signed: 01/04/2018 5:47:31 PM By: Gretta Cool, BSN, RN, CWS, Kim RN, BSN Signed: 01/05/2018 1:16:01 PM By: Linton Ham MD Entered By: Gretta Cool, BSN, RN, CWS, Kim on 01/04/2018 09:33:04 [name], [name] AMarland Kitchen ([id number]) -------------------------------------------------------------------------------- Problem List Details Patient Name: Seabury, Deshan A. Date of Service: 01/04/2018 8:45 AM Medical Record Number: [id number] Patient Account Number: [id number] Date of Birth/Sex: Feb 19, 1971 (47 y.o. 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 ([id number]) -------------------------------------------------------------------------------- Progress Note  Details Patient Name: [name], [name] Date of Service: 01/04/2018 8:45 AM Medical Record Number: [id number] Patient Account Number: [id number] Date of Birth/Sex: 07-04-1971 (47 y.o. M) Treating RN: Cornell Barman Primary Care Provider: Gaetano Net Other Clinician: Referring Provider: Gaetano Net Treating Provider/Extender: Dellia Nims  MICHAEL G Weeks in Treatment: 1 Subjective History of Present Illness (HPI) 12/28/17; this is a now 47 year old man who is a type II diabetic. He was hospitalized from 10/01/17 through 10/19/17. He had an MSSA soft tissue and skin infection. 2 open areas on the left leg were identified he has a smaller area on the left medial calf superiorly just below the knee and a wound just above the left ankle on the posterior medial aspect. I think both of these were surgical IandD sites when he was in the hospital. He was discharged with a wound VAC at that point however this is since been taken off. He follows with Dr. Ola Spurr for the [facility] and he is still on chronic Keflex at 500 twice a day. At that time he was hospitalized his hemoglobin A1c was 15.1 however if I'm reading his endocrinologist notes correctly that is improved. He has been following with Dr. [name] [facility] and he has been applying calcium alginate and Unna boots. He has home health changing the dressing. They have also been attempting to get him external compression pumps although the patient is unaware whether they've been approved by insurance at this point. as mentioned he has a smaller clean wound on the right lateral calf just below the knee and he has a much larger area just above the left ankle medially and posteriorly. Our intake nurse reported greenish purulent looking drainage.the patient did have surgical material sent to pathology in February. This showed chronic abscess The patient also has lymphedema stage III in the left greater than right lower extremities. He has a history of  blisters with wounds but these of all were always healed. The patient thinks that the lymphedema may have been present since he was about 47 years old i.e. about 30 years. He does not have graded pressure stockings and has not worn stockings. He does not have a distant history of DVT PE or phlebitis. He has not been systemically unwell fever no chills. He states that his Lasix is recently been reduced. He tells me his kidney function is at "30%" and he has been followed by Dr. [name] of nephrology. At one point he was on Lasix 80 twice a day however that's been cut back and he is now on Lasix at 20 twice a day. The patient has a history of PAD listed in his records although he comes from Dr. [name] I don't think is felt to have significant PAD. ABIs in our clinic were noncompressible bilaterally. 01/04/18; patient has a large wound on the left lateral lower calf and a small wound on the left medial upper calf. He has been to see his nephrologist who changed him to Demadex 40 mg a day. I'm hopeful this will help with his systemic fluid overload. He also has stage III lymphedema. Really no change in the 2 wounds since last week Objective Constitutional Patient is hypertensive.. Pulse regular and within target range for patient.[name] Respirations regular, non-labored and within target range.. Temperature is normal and within the target range for the patient.[name] appears in no distress. Vitals Time Taken: 8:50 AM, Height: 76 in, Weight: 377.6 lbs, Source: Measured, BMI: 46, Temperature: 98.1 F, Pulse: 65 bpm, Respiratory Rate: 16 breaths/min, Blood Pressure: 166/74 mmHg. [name], [name] ([id number]) Respiratory Respiratory effort is easy and symmetric bilaterally. Rate is normal at rest and on room air.. Bilateral breath sounds are clear and equal in all lobes with no wheezes, rales or rhonchi.. Cardiovascular Heart rhythm  and rate regular, without murmur or gallop.JVP is still elevated at 45. think  his dorsalis pedis pulses are palpable bilaterally although there are faint. severe left greater than right lymphedema bilaterally.. Gastrointestinal (GI) obese but not particularly distended. No shifting dullness. Psychiatric No evidence of depression, anxiety, or agitation. Calm, cooperative, and communicative. Appropriate interactions and affect.. General Notes: wound exam The patient has a small healthy slitlike wound on the left upper medial calf. On the left posterior medial calf distally is a large open wound. I cultured this last week the culture was negative. There is no surrounding erythema. Integumentary (Hair, Skin) Wound #1 status is Open. Original cause of wound was Gradually Appeared. The wound is located on the Left,Proximal,Medial Lower Leg. The wound measures 1.5cm length x 3cm width x 0.5cm depth; 3.534cm^2 area and 1.767cm^3 volume. There is Fat Layer (Subcutaneous Tissue) Exposed exposed. There is no tunneling or undermining noted. There is a large amount of serosanguineous drainage noted. The wound margin is distinct with the outline attached to the wound base. There is medium (34-66%) granulation within the wound bed. There is a small (1-33%) amount of necrotic tissue within the wound bed. The periwound skin appearance exhibited: Dry/Scaly, Hemosiderin Staining. The periwound skin appearance did not exhibit: Callus, Crepitus, Excoriation, Induration, Rash, Scarring, Maceration, Atrophie Blanche, Cyanosis, Ecchymosis, Mottled, Pallor, Rubor, Erythema. Periwound temperature was noted as No Abnormality. Wound #2 status is Open. Original cause of wound was Gradually Appeared. The wound is located on the Left,Lateral Lower Leg. The wound measures 4.2cm length x 7.6cm width x 0.9cm depth; 25.07cm^2 area and 22.563cm^3 volume. There is Fat Layer (Subcutaneous Tissue) Exposed exposed. There is no tunneling or undermining noted. There is a large amount of purulent drainage noted.  Foul odor after cleansing was noted. The wound margin is distinct with the outline attached to the wound base. There is medium (34-66%) red, pink granulation within the wound bed. There is a medium (34-66%) amount of necrotic tissue within the wound bed including Eschar and Adherent Slough. The periwound skin appearance exhibited: Maceration. The periwound skin appearance did not exhibit: Callus, Crepitus, Excoriation, Induration, Rash, Scarring, Dry/Scaly, Atrophie Blanche, Cyanosis, Ecchymosis, Hemosiderin Staining, Mottled, Pallor, Rubor, Erythema. Periwound temperature was noted as No Abnormality. The periwound has tenderness on palpation. Assessment Active Problems ICD-10 L97.223 - Non-pressure chronic ulcer of left calf with necrosis of muscle I89.0 - Lymphedema, not elsewhere classified I87.332 - Chronic venous hypertension (idiopathic) with ulcer and inflammation of left lower extremity E11.622 - Type 2 diabetes mellitus with other skin ulcer [name], [name] ([id number]) Procedures Wound #1 Pre-procedure diagnosis of Wound #1 is a Diabetic Wound/Ulcer of the Lower Extremity located on the Left,Proximal,Medial Lower Leg . There was a Four Layer Compression Therapy Procedure by Cornell Barman, RN. Post procedure Diagnosis Wound #1: Same as Pre-Procedure Plan Wound Cleansing: Wound #1 Left,Proximal,Medial Lower Leg: Clean wound with Normal Saline. Wound #2 Left,Lateral Lower Leg: Clean wound with Normal Saline. Anesthetic (add to Medication List): Wound #1 Left,Proximal,Medial Lower Leg: Topical Lidocaine 4% cream applied to wound bed prior to debridement (In Clinic Only). Wound #2 Left,Lateral Lower Leg: Topical Lidocaine 4% cream applied to wound bed prior to debridement (In Clinic Only). Skin Barriers/Peri-Wound Care: Wound #1 Left,Proximal,Medial Lower Leg: Barrier cream Wound #2 Left,Lateral Lower Leg: Barrier cream Primary Wound Dressing: Wound #1 Left,Proximal,Medial Lower  Leg: Silver Alginate Wound #2 Left,Lateral Lower Leg: Silver Alginate Secondary Dressing: Wound #1 Left,Proximal,Medial Lower Leg: ABD pad XtraSorb Wound #2 Left,Lateral Lower Leg:  ABD pad XtraSorb Dressing Change Frequency: Wound #1 Left,Proximal,Medial Lower Leg: Change dressing every week Wound #2 Left,Lateral Lower Leg: Change dressing every week Follow-up Appointments: Wound #1 Left,Proximal,Medial Lower Leg: Return Appointment in 1 week. Wound #2 Left,Lateral Lower Leg: Return Appointment in 1 week. Edema Control: Wound #1 Left,Proximal,Medial Lower Leg: 4-Layer Compression System - Left Lower Extremity Wound #2 Left,Lateral Lower Leg: 4-Layer Compression System - Left Lower Extremity Home Health: [name], BUDAI. ([id number]) Wound #1 Left,Proximal,Medial Lower Leg: [place] Nurse may visit PRN to address patient s wound care needs. FACE TO FACE ENCOUNTER: MEDICARE and MEDICAID PATIENTS: I certify that this patient is under my care and that I had a face-to-face encounter that meets the physician face-to-face encounter requirements with this patient on this date. The encounter with the patient was in whole or in part for the following MEDICAL CONDITION: (primary reason for [place]) MEDICAL NECESSITY: I certify, that based on my findings, NURSING services are a medically necessary home health service. HOME BOUND STATUS: I certify that my clinical findings support that this patient is homebound (i.e., Due to illness or injury, pt requires aid of supportive devices such as crutches, cane, wheelchairs, walkers, the use of special transportation or the assistance of another person to leave their place of residence. There is a normal inability to leave the home and doing so requires considerable and taxing effort. Other absences are for medical reasons / religious services and are infrequent or of short duration when for other reasons). If  current dressing causes regression in wound condition, may D/C ordered dressing product/s and apply Normal Saline Moist Dressing daily until next [place] / Other MD appointment. [place] of regression in wound condition at [phone]. Wound #2 Left,Lateral Lower Leg: [place] Nurse may visit PRN to address patient s wound care needs. FACE TO FACE ENCOUNTER: MEDICARE and MEDICAID PATIENTS: I certify that this patient is under my care and that I had a face-to-face encounter that meets the physician face-to-face encounter requirements with this patient on this date. The encounter with the patient was in whole or in part for the following MEDICAL CONDITION: (primary reason for [place]) MEDICAL NECESSITY: I certify, that based on my findings, NURSING services are a medically necessary home health service. HOME BOUND STATUS: I certify that my clinical findings support that this patient is homebound (i.e., Due to illness or injury, pt requires aid of supportive devices such as crutches, cane, wheelchairs, walkers, the use of special transportation or the assistance of another person to leave their place of residence. There is a normal inability to leave the home and doing so requires considerable and taxing effort. Other absences are for medical reasons / religious services and are infrequent or of short duration when for other reasons). If current dressing causes regression in wound condition, may D/C ordered dressing product/s and apply Normal Saline Moist Dressing daily until next [place] / Other MD appointment. [place] of regression in wound condition at [phone]. Medications-please add to medication list.: Wound #1 Left,Proximal,Medial Lower Leg: P.O. Antibiotics - Continue antibiotics Wound #2 Left,Lateral Lower Leg: P.O. Antibiotics - Continue antibiotics #1 the patient still looks  systemically fluid volume overloaded although he is only just recently started the torsemide. He has a follow-up with nephrology next week. #2at this point we will continue with silver alginate/ABDs. I changed him from Unna boots to 4-layer compression to  see if we can get better edema control. I've cautioned him that this should not hurt. If there is pain he will take them off. #3 were going to try to weigh him with some regularity in clinic. I also asked him to get a scale for use at home. This will be helpful information for his nephrologist Electronic Signature(s) Signed: 01/05/2018 1:16:01 PM By: Linton Ham MD Entered By: Linton Ham on 01/04/2018 10:27:19 [name], [name] ([id number]) -------------------------------------------------------------------------------- SuperBill Details Patient Name: [name], [name] Date of Service: 01/04/2018 Medical Record Number: [id number] Patient Account Number: [id number] Date of Birth/Sex: 17-Nov-1970 (47 y.o. M) Treating RN: Cornell Barman Primary Care Provider: Gaetano Net Other Clinician: Referring Provider: Gaetano Net Treating Provider/Extender: Tito Dine in Treatment: 1 Diagnosis Coding ICD-10 Codes Code Description [phone] Non-pressure chronic ulcer of left calf with necrosis of muscle I89.0 Lymphedema, not elsewhere classified I87.332 Chronic venous hypertension (idiopathic) with ulcer and inflammation of left lower extremity E11.622 Type 2 diabetes mellitus with other skin ulcer Facility Procedures CPT4 Code Description: [id number] (Facility Use Only) [phone] - APPLY MULTLAY COMPRS LWR RT LEG Modifier: Quantity: 1 Physician Procedures CPT4 Code: [id number] Description: 15726 - WC PHYS LEVEL 3 - EST PT ICD-10 Diagnosis Description L97.223 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

## 2018-01-11 ENCOUNTER — Encounter: Payer: BLUE CROSS/BLUE SHIELD | Admitting: Internal Medicine

## 2018-01-11 ENCOUNTER — Telehealth (INDEPENDENT_AMBULATORY_CARE_PROVIDER_SITE_OTHER): Payer: Self-pay | Admitting: Vascular Surgery

## 2018-01-11 DIAGNOSIS — E11622 Type 2 diabetes mellitus with other skin ulcer: Secondary | ICD-10-CM | POA: Diagnosis not present

## 2018-01-11 NOTE — Telephone Encounter (Signed)
I left a message on patient voicemail informing him with [employer] number [phone] to check on the status for his lymphedema pump

## 2018-01-13 NOTE — Progress Notes (Signed)
RAYMOUND, KATICH ([id number]) Visit Report for 01/11/2018 Arrival Information Details Patient Name: Sigl, Keyshaun A. Date of Service: 01/11/2018 9:00 AM Medical Record Number: [id number] Patient Account Number: [id number] Date of Birth/Sex: 1970/09/19 (47 y.o. M) Treating RN: Cornell Barman Primary Care Shizuo Biskup: Gaetano Net Other Clinician: Referring Gwyn Mehring: Gaetano Net Treating Francille Wittmann/Extender: Tito Dine in Treatment: 2 Visit Information History Since Last Visit Added or deleted any medications: No Patient Arrived: Ambulatory Any new allergies or adverse reactions: No Arrival Time: 08:58 Had a fall or experienced change in No Accompanied By: Mom activities of daily living that may affect Transfer Assistance: None risk of falls: Patient Identification Verified: Yes Signs or symptoms of abuse/neglect since last visito No Secondary Verification Process Yes Hospitalized since last visit: No Completed: Implantable device outside of the clinic excluding No Patient Requires Transmission-Based No cellular tissue based products placed in the center Precautions: since last visit: Patient Has Alerts: Yes Has Dressing in Place as Prescribed: Yes Patient Alerts: DM II Has Compression in Place as Prescribed: Yes noncompressible Pain Present Now: No bilateral Electronic Signature(s) Signed: 01/11/2018 4:52:44 PM By: Gretta Cool, BSN, RN, CWS, Kim RN, BSN Entered By: Gretta Cool, BSN, RN, CWS, Kim on 01/11/2018 08:58:58 Ringler, [name] ([id number]) -------------------------------------------------------------------------------- Encounter Discharge Information Details Patient Name: Gutterman, Conan A. Date of Service: 01/11/2018 9:00 AM Medical Record Number: [id number] Patient Account Number: [id number] Date of Birth/Sex: [birth year] (47 y.o. M) Treating RN: Montey Hora Primary Care Alric Geise: Gaetano Net Other Clinician: Referring Ramia Sidney: Gaetano Net Treating Aaryav Hopfensperger/Extender:  Tito Dine in Treatment: 2 Encounter Discharge Information Items Discharge Condition: Stable Ambulatory Status: Ambulatory Discharge Destination: Home Transportation: Private Auto Accompanied By: mother Schedule Follow-up Appointment: Yes Clinical Summary of Care: Electronic Signature(s) Signed: 01/11/2018 11:49:52 AM By: Montey Hora Entered By: Montey Hora on 01/11/2018 11:49:52 Pilz, Christerpher A. ([id number]) -------------------------------------------------------------------------------- Lower Extremity Assessment Details Patient Name: Garton, Lauro A. Date of Service: 01/11/2018 9:00 AM Medical Record Number: [id number] Patient Account Number: [id number] Date of Birth/Sex: Apr 27, 1971 (47 y.o. M) Treating RN: Cornell Barman Primary Care Tajon Moring: Gaetano Net Other Clinician: Referring Levy Cedano: Gaetano Net Treating Blinda Turek/Extender: Tito Dine in Treatment: 2 Edema Assessment Assessed: [Left: No] [Right: No] Edema: [Left: Ye] [Right: s] Calf Left: Right: Point of Measurement: 42 cm From Medial Instep 61.5 cm cm Ankle Left: Right: Point of Measurement: 12 cm From Medial Instep 48 cm cm Vascular Assessment Claudication: Claudication Assessment [Left:None] Pulses: Dorsalis Pedis Palpable: [Left:Yes] Posterior Tibial Extremity colors, hair growth, and conditions: Extremity Color: [Left:Normal] Hair Growth on Extremity: [Left:Yes] Temperature of Extremity: [Left:Warm] Capillary Refill: [Left:< 3 seconds] Toe Nail Assessment Left: Right: Thick: No Discolored: No Deformed: No Improper Length and Hygiene: No Electronic Signature(s) Signed: 01/11/2018 4:52:44 PM By: Gretta Cool, BSN, RN, CWS, Kim RN, BSN Entered By: Gretta Cool, BSN, RN, CWS, Kim on 01/11/2018 09:18:38 Deveney, Alroy A. ([id number]) -------------------------------------------------------------------------------- Multi Wound Chart Details Patient Name: Prange, Chuck A. Date of Service:  01/11/2018 9:00 AM Medical Record Number: [id number] Patient Account Number: [id number] Date of Birth/Sex: [birth year] (47 y.o. M) Treating RN: Cornell Barman Primary Care Marua Qin: Gaetano Net Other Clinician: Referring Duran Ohern: Gaetano Net Treating Larrie Fraizer/Extender: Tito Dine in Treatment: 2 Vital Signs Height(in): 76 Pulse(bpm): 56 Weight(lbs): 364.4 Blood Pressure(mmHg): 168/79 Body Mass Index(BMI): 44 Temperature(F): 98.0 Respiratory Rate 16 (breaths/min): Photos: [1:No Photos] [2:No Photos] [3:No Photos] Wound Location: [1:Left Lower Leg - Medial, Proximal] [2:Left Lower Leg - Lateral, Posterior] [3:Left Lower Leg - Medial, Anterior] Wounding Event: [1:Gradually Appeared] [2:Gradually  Appeared] [3:Gradually Appeared] Primary Etiology: [1:Diabetic Wound/Ulcer of the Lower Extremity] [2:Diabetic Wound/Ulcer of the Lower Extremity] [3:Lymphedema] Secondary Etiology: [1:Lymphedema] [2:Lymphedema] [3:N/A] Comorbid History: [1:Hypertension, Type II Diabetes, Neuropathy] [2:Hypertension, Type II Diabetes, Neuropathy] [3:Hypertension, Type II Diabetes, Neuropathy] Date Acquired: [1:09/30/2017] [2:09/30/2017] [3:01/11/2018] Weeks of Treatment: [1:2] [2:2] [3:0] Wound Status: [1:Open] [2:Open] [3:Open] Measurements L x W x D [1:1x1x0.1] [2:4.5x8x0.8] [3:0.6x1x0.1] (cm) Area (cm) : [1:0.785] [2:28.274] [3:0.471] Volume (cm) : [1:0.079] [2:22.619] [3:0.047] % Reduction in Area: [1:80.50%] [2:0.00%] [3:N/A] % Reduction in Volume: [1:96.70%] [2:-100.00%] [3:N/A] Classification: [1:Grade 2] [2:Grade 2] [3:Partial Thickness] Exudate Amount: [1:Large] [2:Large] [3:None Present] Exudate Type: [1:Serosanguineous] [2:Purulent] [3:N/A] Exudate Color: [1:red, brown] [2:yellow, brown, green] [3:N/A] Foul Odor After Cleansing: [1:Yes] [2:Yes] [3:No] Odor Anticipated Due to [1:No] [2:No] [3:N/A] Product Use: Wound Margin: [1:Distinct, outline attached] [2:Distinct, outline attached]  [3:Flat and Intact] Granulation Amount: [1:Large (67-100%)] [2:Medium (34-66%)] [3:None Present (0%)] Granulation Quality: [1:Red] [2:Red, Pink] [3:N/A] Necrotic Amount: [1:Small (1-33%)] [2:Medium (34-66%)] [3:Large (67-100%)] Necrotic Tissue: [1:Adherent Slough] [2:Eschar, Adherent Slough] [3:Eschar] Exposed Structures: [1:Fat Layer (Subcutaneous Tissue) Exposed: Yes Fascia: No Tendon: No Muscle: No Joint: No Bone: No] [2:Fat Layer (Subcutaneous Tissue) Exposed: Yes Fascia: No Tendon: No Muscle: No Joint: No Bone: No] [3:Fascia: No Fat Layer (Subcutaneous Tissue)  Exposed: No Tendon: No Muscle: No Joint: No Bone: No] Epithelialization: None None Large (67-100%) Debridement: N/A Debridement - Excisional N/A Pre-procedure N/A 09:45 N/A Verification/Time Out Taken: Pain Control: N/A Other N/A Tissue Debrided: N/A Subcutaneous N/A Level: N/A Skin/Subcutaneous Tissue N/A Debridement Area (sq cm): N/A 36 N/A Instrument: N/A Curette N/A Bleeding: N/A Moderate N/A Hemostasis Achieved: N/A Pressure N/A Procedural Pain: N/A 0 N/A Post Procedural Pain: N/A 0 N/A Debridement Treatment N/A Procedure was tolerated well N/A Response: Post Debridement N/A 4.5x8x0.8 N/A Measurements L x W x D (cm) Post Debridement Volume: N/A 22.619 N/A (cm) Periwound Skin Texture: Excoriation: No Excoriation: No Excoriation: No Induration: No Induration: No Induration: No Callus: No Callus: No Callus: No Crepitus: No Crepitus: No Crepitus: No Rash: No Rash: No Rash: No Scarring: No Scarring: No Scarring: No Periwound Skin Moisture: Dry/Scaly: Yes Maceration: Yes Maceration: No Maceration: No Dry/Scaly: No Dry/Scaly: No Periwound Skin Color: Hemosiderin Staining: Yes Atrophie Blanche: No Atrophie Blanche: No Atrophie Blanche: No Cyanosis: No Cyanosis: No Cyanosis: No Ecchymosis: No Ecchymosis: No Ecchymosis: No Erythema: No Erythema: No Erythema: No Hemosiderin Staining:  No Hemosiderin Staining: No Mottled: No Mottled: No Mottled: No Pallor: No Pallor: No Pallor: No Rubor: No Rubor: No Rubor: No Temperature: No Abnormality No Abnormality N/A Tenderness on Palpation: No Yes No Wound Preparation: Ulcer Cleansing: Ulcer Cleansing: Ulcer Cleansing: Rinsed/Irrigated with Saline, Rinsed/Irrigated with Saline, Rinsed/Irrigated with Saline Other: soap and water Other: soap and water Topical Anesthetic Applied: Topical Anesthetic Applied: Topical Anesthetic Applied: None Other: lidocaine 4% Other: lidocaine 4% Procedures Performed: N/A Debridement N/A Treatment Notes Electronic Signature(s) Signed: 01/11/2018 4:50:03 PM By: Linton Ham MD Entered By: Linton Ham on 01/11/2018 10:01:21 Goodnow, Demetries AMarland Kitchen ([id number]) -------------------------------------------------------------------------------- Multi-Disciplinary Care Plan Details Patient Name: Ting, Carr A. Date of Service: 01/11/2018 9:00 AM Medical Record Number: [id number] Patient Account Number: [id number] Date of Birth/Sex: 01-Nov-1970 (47 y.o. M) Treating RN: Cornell Barman Primary Care Shanard Treto: Gaetano Net Other Clinician: Referring Teddie Mehta: Gaetano Net Treating Will Schier/Extender: Tito Dine in Treatment: 2 Active Inactive ` Abuse / Safety / Falls / Self Care Management Nursing Diagnoses: Potential for falls Goals: Patient will remain injury free related to falls Date Initiated: 12/28/2017 Target Resolution Date:  04/08/2018 Goal Status: Active Interventions: Assess fall risk on admission and as needed Notes: ` Nutrition Nursing Diagnoses: Impaired glucose control: actual or potential Goals: Patient/caregiver verbalizes understanding of need to maintain therapeutic glucose control per primary care physician Date Initiated: 12/28/2017 Target Resolution Date: 04/08/2018 Goal Status: Active Interventions: Provide education on elevated blood sugars and impact on  wound healing Notes: ` Orientation to the Wound Care Program Nursing Diagnoses: Knowledge deficit related to the wound healing center program Goals: Patient/caregiver will verbalize understanding of the [place] [facility] Program Date Initiated: 12/28/2017 Target Resolution Date: 04/08/2018 Goal Status: Active Interventions: Elms, Rudie A. ([id number]) Provide education on orientation to the wound center Notes: ` Wound/Skin Impairment Nursing Diagnoses: Impaired tissue integrity Goals: Ulcer/skin breakdown will heal within 14 weeks Date Initiated: 12/28/2017 Target Resolution Date: 04/08/2018 Goal Status: Active Interventions: Assess patient/caregiver ability to obtain necessary supplies Assess patient/caregiver ability to perform ulcer/skin care regimen upon admission and as needed Assess ulceration(s) every visit Notes: Electronic Signature(s) Signed: 01/11/2018 4:52:44 PM By: Gretta Cool, BSN, RN, CWS, Kim RN, BSN Entered By: Gretta Cool, BSN, RN, CWS, Kim on 01/11/2018 09:46:46 Matzke, Zackery A. ([id number]) -------------------------------------------------------------------------------- Pain Assessment Details Patient Name: Rumery, Agnes A. Date of Service: 01/11/2018 9:00 AM Medical Record Number: [id number] Patient Account Number: [id number] Date of Birth/Sex: August 28, 1971 (47 y.o. M) Treating RN: Cornell Barman Primary Care Chealsey Miyamoto: Gaetano Net Other Clinician: Referring Nizhoni Parlow: Gaetano Net Treating Tavish Gettis/Extender: Tito Dine in Treatment: 2 Active Problems Location of Pain Severity and Description of Pain Patient Has Paino No Site Locations With Dressing Change: No Pain Management and Medication Current Pain Management: Goals for Pain Management Topical or injectable lidocaine is offered to patient for acute pain when surgical debridement is performed. If needed, Patient is instructed to use over the counter pain medication for the following 24-48 hours after  debridement. Wound care MDs do not prescribed pain medications. Patient has chronic pain or uncontrolled pain. Patient has been instructed to make an appointment with their Primary Care Physician for pain management. Electronic Signature(s) Signed: 01/11/2018 4:52:44 PM By: Gretta Cool, BSN, RN, CWS, Kim RN, BSN Entered By: Gretta Cool, BSN, RN, CWS, Kim on 01/11/2018 08:59:28 [name] ([id number]) -------------------------------------------------------------------------------- Patient/Caregiver Education Details Patient Name: Pieper, Prabhjot A. Date of Service: 01/11/2018 9:00 AM Medical Record Number: [id number] Patient Account Number: [id number] Date of Birth/Gender: 08-Jul-1971 (47 y.o. M) Treating RN: Montey Hora Primary Care Physician: Gaetano Net Other Clinician: Referring Physician: Gaetano Net Treating Physician/Extender: Tito Dine in Treatment: 2 Education Assessment Education Provided To: Patient Education Topics Provided Venous: Handouts: Other: leg elevation Methods: Explain/Verbal Responses: State content correctly Electronic Signature(s) Signed: 01/11/2018 4:31:04 PM By: Montey Hora Entered By: Montey Hora on 01/11/2018 11:50:12 Ford, Nechemia A. ([id number]) -------------------------------------------------------------------------------- Wound Assessment Details Patient Name: Prouse, Dell A. Date of Service: 01/11/2018 9:00 AM Medical Record Number: [id number] Patient Account Number: [id number] Date of Birth/Sex: October 05, 1970 (47 y.o. M) Treating RN: Cornell Barman Primary Care Nydia Ytuarte: Gaetano Net Other Clinician: Referring Daysy Santini: Gaetano Net Treating Bonnetta Allbee/Extender: Tito Dine in Treatment: 2 Wound Status Wound Number: 1 Primary Etiology: Diabetic Wound/Ulcer of the Lower Extremity Wound Location: Left Lower Leg - Medial, Proximal Secondary Lymphedema Wounding Event: Gradually Appeared Etiology: Date Acquired:  09/30/2017 Wound Status: Open Weeks Of Treatment: 2 Comorbid History: Hypertension, Type II Diabetes, Clustered Wound: No Neuropathy Wound Measurements Length: (cm) 1 Width: (cm) 1 Depth: (cm) 0.1 Area: (cm) 0.785 Volume: (cm) 0.079 % Reduction in Area: 80.5% % Reduction in Volume: 96.7%  Epithelialization: None Tunneling: No Undermining: No Wound Description Classification: Grade 2 Wound Margin: Distinct, outline attached Exudate Amount: Large Exudate Type: Serosanguineous Exudate Color: red, brown Foul Odor After Cleansing: Yes Due to Product Use: No Slough/Fibrino Yes Wound Bed Granulation Amount: Large (67-100%) Exposed Structure Granulation Quality: Red Fascia Exposed: No Necrotic Amount: Small (1-33%) Fat Layer (Subcutaneous Tissue) Exposed: Yes Necrotic Quality: Adherent Slough Tendon Exposed: No Muscle Exposed: No Joint Exposed: No Bone Exposed: No Periwound Skin Texture Texture Color No Abnormalities Noted: No No Abnormalities Noted: No Callus: No Atrophie Blanche: No Crepitus: No Cyanosis: No Excoriation: No Ecchymosis: No Induration: No Erythema: No Rash: No Hemosiderin Staining: Yes Scarring: No Mottled: No Pallor: No Moisture Rubor: No No Abnormalities Noted: No Dry / Scaly: Yes Temperature / Pain Maceration: No Temperature: No Abnormality Velasques, Thoma A. ([id number]) Wound Preparation Ulcer Cleansing: Rinsed/Irrigated with Saline, Other: soap and water, Topical Anesthetic Applied: Other: lidocaine 4%, Treatment Notes Wound #1 (Left, Proximal, Medial Lower Leg) 1. Cleansed with: Clean wound with Normal Saline Cleanse wound with antibacterial soap and water 2. Anesthetic Topical Lidocaine 4% cream to wound bed prior to debridement 3. Peri-wound Care: Moisturizing lotion 4. Dressing Applied: Other dressing (specify in notes) 5. Secondary Dressing Applied ABD Pad 7. Secured with 4-Layer Compression System - Left Lower  Extremity Notes silvercell, caramax Electronic Signature(s) Signed: 01/11/2018 4:52:44 PM By: Gretta Cool, BSN, RN, CWS, Kim RN, BSN Entered By: Gretta Cool, BSN, RN, CWS, Kim on 01/11/2018 09:12:07 Benison, [name] ([id number]) -------------------------------------------------------------------------------- Wound Assessment Details Patient Name: Batley, Tryce A. Date of Service: 01/11/2018 9:00 AM Medical Record Number: [id number] Patient Account Number: [id number] Date of Birth/Sex: Aug 26, 1971 (47 y.o. M) Treating RN: Cornell Barman Primary Care Tyliek Timberman: Gaetano Net Other Clinician: Referring Desiree Daise: Gaetano Net Treating Jeovanni Heuring/Extender: Tito Dine in Treatment: 2 Wound Status Wound Number: 2 Primary Etiology: Diabetic Wound/Ulcer of the Lower Extremity Wound Location: Left Lower Leg - Lateral, Posterior Secondary Lymphedema Wounding Event: Gradually Appeared Etiology: Date Acquired: 09/30/2017 Wound Status: Open Weeks Of Treatment: 2 Comorbid History: Hypertension, Type II Diabetes, Clustered Wound: No Neuropathy Photos Photo Uploaded By: Roger Shelter on 01/11/2018 15:41:53 Wound Measurements Length: (cm) 4.5 Width: (cm) 8 Depth: (cm) 0.8 Area: (cm) 28.274 Volume: (cm) 22.619 % Reduction in Area: 0% % Reduction in Volume: -100% Epithelialization: None Tunneling: No Undermining: No Wound Description Classification: Grade 2 Wound Margin: Distinct, outline attached Exudate Amount: Large Exudate Type: Purulent Exudate Color: yellow, brown, green Foul Odor After Cleansing: Yes Due to Product Use: No Slough/Fibrino Yes Wound Bed Granulation Amount: Medium (34-66%) Exposed Structure Granulation Quality: Red, Pink Fascia Exposed: No Necrotic Amount: Medium (34-66%) Fat Layer (Subcutaneous Tissue) Exposed: Yes Necrotic Quality: Eschar, Adherent Slough Tendon Exposed: No Muscle Exposed: No Joint Exposed: No Bone Exposed: No Periwound Skin Texture Amick,  Zaevion A. ([id number]) Texture Color No Abnormalities Noted: No No Abnormalities Noted: No Callus: No Atrophie Blanche: No Crepitus: No Cyanosis: No Excoriation: No Ecchymosis: No Induration: No Erythema: No Rash: No Hemosiderin Staining: No Scarring: No Mottled: No Pallor: No Moisture Rubor: No No Abnormalities Noted: No Dry / Scaly: No Temperature / Pain Maceration: Yes Temperature: No Abnormality Tenderness on Palpation: Yes Wound Preparation Ulcer Cleansing: Rinsed/Irrigated with Saline, Other: soap and water, Topical Anesthetic Applied: Other: lidocaine 4%, Treatment Notes Wound #2 (Left, Lateral, Posterior Lower Leg) 1. Cleansed with: Clean wound with Normal Saline Cleanse wound with antibacterial soap and water 2. Anesthetic Topical Lidocaine 4% cream to wound bed prior to debridement 3. Peri-wound Care:  Moisturizing lotion 4. Dressing Applied: Other dressing (specify in notes) 5. Secondary Dressing Applied ABD Pad 7. Secured with 4-Layer Compression System - Left Lower Extremity Notes silvercell, caramax Electronic Signature(s) Signed: 01/11/2018 4:52:44 PM By: Gretta Cool, BSN, RN, CWS, Kim RN, BSN Entered By: Gretta Cool, BSN, RN, CWS, Kim on 01/11/2018 09:15:22 Seelye, [name] ([id number]) -------------------------------------------------------------------------------- Wound Assessment Details Patient Name: Johns, Lopez A. Date of Service: 01/11/2018 9:00 AM Medical Record Number: [id number] Patient Account Number: [id number] Date of Birth/Sex: 02/11/1971 (47 y.o. M) Treating RN: Cornell Barman Primary Care Alfonso Shackett: Gaetano Net Other Clinician: Referring Rosselyn Martha: Gaetano Net Treating Lynita Groseclose/Extender: Tito Dine in Treatment: 2 Wound Status Wound Number: 3 Primary Etiology: Lymphedema Wound Location: Left Lower Leg - Medial, Anterior Wound Status: Open Wounding Event: Gradually Appeared Comorbid Hypertension, Type II Diabetes, History:  Neuropathy Date Acquired: 01/11/2018 Weeks Of Treatment: 0 Clustered Wound: No Photos Photo Uploaded By: Roger Shelter on 01/11/2018 15:40:28 Wound Measurements Length: (cm) 0.6 Width: (cm) 1 Depth: (cm) 0.1 Area: (cm) 0.471 Volume: (cm) 0.047 % Reduction in Area: % Reduction in Volume: Epithelialization: Large (67-100%) Tunneling: No Undermining: No Wound Description Classification: Partial Thickness Wound Margin: Flat and Intact Exudate Amount: None Present Foul Odor After Cleansing: No Slough/Fibrino Yes Wound Bed Granulation Amount: None Present (0%) Exposed Structure Necrotic Amount: Large (67-100%) Fascia Exposed: No Necrotic Quality: Eschar Fat Layer (Subcutaneous Tissue) Exposed: No Tendon Exposed: No Muscle Exposed: No Joint Exposed: No Bone Exposed: No Periwound Skin Texture Texture Color No Abnormalities Noted: No No Abnormalities Noted: No Hunn, Cadan A. ([id number]) Callus: No Atrophie Blanche: No Crepitus: No Cyanosis: No Excoriation: No Ecchymosis: No Induration: No Erythema: No Rash: No Hemosiderin Staining: No Scarring: No Mottled: No Pallor: No Moisture Rubor: No No Abnormalities Noted: No Dry / Scaly: No Maceration: No Wound Preparation Ulcer Cleansing: Rinsed/Irrigated with Saline Topical Anesthetic Applied: None Treatment Notes Wound #3 (Left, Medial, Anterior Lower Leg) 1. Cleansed with: Clean wound with Normal Saline Cleanse wound with antibacterial soap and water 2. Anesthetic Topical Lidocaine 4% cream to wound bed prior to debridement 3. Peri-wound Care: Moisturizing lotion 4. Dressing Applied: Other dressing (specify in notes) 5. Secondary Dressing Applied ABD Pad 7. Secured with 4-Layer Compression System - Left Lower Extremity Notes silvercell, caramax Electronic Signature(s) Signed: 01/11/2018 4:52:44 PM By: Gretta Cool, BSN, RN, CWS, Kim RN, BSN Entered By: Gretta Cool, BSN, RN, CWS, Kim on 01/11/2018  09:16:51 Brodzinski, [name] ([id number]) -------------------------------------------------------------------------------- Vitals Details Patient Name: Ruzich, Shion A. Date of Service: 01/11/2018 9:00 AM Medical Record Number: [id number] Patient Account Number: [id number] Date of Birth/Sex: Nov 09, 1970 (47 y.o. M) Treating RN: Cornell Barman Primary Care Reshaun Briseno: Gaetano Net Other Clinician: Referring Eunie Lawn: Gaetano Net Treating Paulett Kaufhold/Extender: Tito Dine in Treatment: 2 Vital Signs Time Taken: 09:00 Temperature (F): 98.0 Height (in): 76 Pulse (bpm): 56 Weight (lbs): 364.4 Respiratory Rate (breaths/min): 16 Body Mass Index (BMI): 44.4 Blood Pressure (mmHg): 168/79 Reference Range: 80 - 120 mg / dl Electronic Signature(s) Signed: 01/11/2018 4:52:44 PM By: Gretta Cool, BSN, RN, CWS, Kim RN, BSN Entered By: Gretta Cool, BSN, RN, CWS, Kim on 01/11/2018 09:01:37

## 2018-01-13 NOTE — Progress Notes (Signed)
JERONE, CUDMORE ([id number]) Visit Report for 01/11/2018 Debridement Details Patient Name: [name], [name] Date of Service: 01/11/2018 9:00 AM Medical Record Number: [id number] Patient Account Number: [id number] Date of Birth/Sex: Oct 30, 1970 (47 y.o. M) Treating RN: Cornell Barman Primary Care Provider: Gaetano Net Other Clinician: Referring Provider: Gaetano Net Treating Provider/Extender: Tito Dine in Treatment: 2 Debridement Performed for Wound #2 Left,Lateral,Posterior Lower Leg Assessment: Performed By: Physician Ricard Dillon, MD Debridement Type: Debridement Severity of Tissue Pre Fat layer exposed Debridement: Pre-procedure Verification/Time Yes - 09:45 Out Taken: Start Time: 09:46 Pain Control: Other : lidociane 4% Total Area Debrided (L x W): 4.5 (cm) x 8 (cm) = 36 (cm) Tissue and other material Viable, Non-Viable, Subcutaneous debrided: Level: Skin/Subcutaneous Tissue Debridement Description: Excisional Instrument: Curette Bleeding: Moderate Hemostasis Achieved: Pressure End Time: 09:48 Procedural Pain: 0 Post Procedural Pain: 0 Response to Treatment: Procedure was tolerated well Level of Consciousness: Awake and Alert Post Procedure Vitals: Temperature: 98 Pulse: 56 Respiratory Rate: 16 Blood Pressure: Systolic Blood Pressure: 841 Diastolic Blood Pressure: 79 Post Debridement Measurements of Total Wound Length: (cm) 4.5 Width: (cm) 8 Depth: (cm) 0.8 Volume: (cm) 22.619 Character of Wound/Ulcer Post Debridement: Requires Further Debridement Severity of Tissue Post Debridement: Fat layer exposed Post Procedure Diagnosis Same as Pre-procedure [name], [name] ([id number]) Electronic Signature(s) Signed: 01/11/2018 4:50:03 PM By: Linton Ham MD Signed: 01/11/2018 4:52:44 PM By: Gretta Cool, BSN, RN, CWS, Kim RN, BSN Entered By: Linton Ham on 01/11/2018 10:02:20 [name], [name]  ([id number]) -------------------------------------------------------------------------------- HPI Details Patient Name: Sedlacek, Matheo A. Date of Service: 01/11/2018 9:00 AM Medical Record Number: [id number] Patient Account Number: [id number] Date of Birth/Sex: 07-25-1971 (47 y.o. M) Treating RN: Cornell Barman Primary Care Provider: Gaetano Net Other Clinician: Referring Provider: Gaetano Net Treating Provider/Extender: Tito Dine in Treatment: 2 History of Present Illness HPI Description: 12/28/17; this is a now 46 year old man who is a type II diabetic. He was hospitalized from 10/01/17 through 10/19/17. He had an MSSA soft tissue and skin infection. 2 open areas on the left leg were identified he has a smaller area on the left medial calf superiorly just below the knee and a wound just above the left ankle on the posterior medial aspect. I think both of these were surgical IandD sites when he was in the hospital. He was discharged with a wound VAC at that point however this is since been taken off. He follows with Dr. Ola Spurr for the [facility] and he is still on chronic Keflex at 500 twice a day. At that time he was hospitalized his hemoglobin A1c was 15.1 however if I'm reading his endocrinologist notes correctly that is improved. He has been following with [name] and he has been applying calcium alginate and Unna boots. He has home health changing the dressing. They have also been attempting to get him external compression pumps although the patient is unaware whether they've been approved by insurance at this point. as mentioned he has a smaller clean wound on the right lateral calf just below the knee and he has a much larger area just above the left ankle medially and posteriorly. Our intake nurse reported greenish purulent looking drainage.the patient did have surgical material sent to pathology in February. This showed chronic abscess The patient also has  lymphedema stage III in the left greater than right lower extremities. He has a history of blisters with wounds but these of all were always healed. The patient thinks that  the lymphedema may have been present since he was about 47 years old i.e. about 30 years. He does not have graded pressure stockings and has not worn stockings. He does not have a distant history of DVT PE or phlebitis. He has not been systemically unwell fever no chills. He states that his Lasix is recently been reduced. He tells me his kidney function is at "30%" and he has been followed by Dr. [name] of nephrology. At one point he was on Lasix 80 twice a day however that's been cut back and he is now on Lasix at 20 twice a day. The patient has a history of PAD listed in his records although he comes from Dr. [name] I don't think is felt to have significant PAD. ABIs in our clinic were noncompressible bilaterally. 01/04/18; patient has a large wound on the left lateral lower calf and a small wound on the left medial upper calf. He has been to see his nephrologist who changed him to Demadex 40 mg a day. I'm hopeful this will help with his systemic fluid overload. He also has stage III lymphedema. Really no change in the 2 wounds since last week 01/11/18; the patient is down 13 pounds. He put his stage III lymphedema left leg in 4K compression last week and there is less edema fluid however we still haven't been able to communicate with home health but [facility] is [facility] but the dressings have not been changed. The patient noted an odor last week. He is also had compression pumps ordered by [facility] this as a not completed the paperwork stage. Electronic Signature(s) Signed: 01/11/2018 4:50:03 PM By: Linton Ham MD Entered By: Linton Ham on 01/11/2018 10:18:43 [name], [name] ([id number]) -------------------------------------------------------------------------------- Physical Exam  Details Patient Name: [name], [name] Date of Service: 01/11/2018 9:00 AM Medical Record Number: [id number] Patient Account Number: [id number] Date of Birth/Sex: 1971-04-07 (47 y.o. M) Treating RN: Cornell Barman Primary Care Provider: Gaetano Net Other Clinician: Referring Provider: Gaetano Net Treating Provider/Extender: Tito Dine in Treatment: 2 Constitutional Patient is hypertensive.. Pulse regular and within target range for patient.Marland [name] Respirations regular, non-labored and within target range.. Temperature is normal and within the target range for the patient.Marland [name] appears in no distress. Notes wound exam; oSmall healthy wound on the left upper medial calf has closed over oOn the left posterior calf the surface of this had necrotic subcutaneous tissue over the majority of the wound. Using a curette this was debrided to a better looking wound surface. oSmall wound on the left anterior looks healthy. No debridement is required Electronic Signature(s) Signed: 01/11/2018 4:50:03 PM By: Linton Ham MD Entered By: Linton Ham on 01/11/2018 10:20:00 [name], [name] ([id number]) -------------------------------------------------------------------------------- Physician Orders Details Patient Name: [name], [name] Date of Service: 01/11/2018 9:00 AM Medical Record Number: [id number] Patient Account Number: [id number] Date of Birth/Sex: 1971/04/12 (47 y.o. M) Treating RN: Cornell Barman Primary Care Provider: Gaetano Net Other Clinician: Referring Provider: Gaetano Net Treating Provider/Extender: Tito Dine in Treatment: 2 Verbal / Phone Orders: No Diagnosis Coding Wound Cleansing Wound #1 Left,Proximal,Medial Lower Leg o Clean wound with Normal Saline. Wound #2 Left,Lateral,Posterior Lower Leg o Clean wound with Normal Saline. Wound #3 Left,Medial,Anterior Lower Leg o Clean wound with Normal Saline. Anesthetic (add to Medication List) Wound #1  Left,Proximal,Medial Lower Leg o Topical Lidocaine 4% cream applied to wound bed prior to debridement (In Clinic Only). Wound #2 Left,Lateral,Posterior Lower Leg o Topical Lidocaine 4% cream applied  to wound bed prior to debridement (In Clinic Only). Wound #3 Left,Medial,Anterior Lower Leg o Topical Lidocaine 4% cream applied to wound bed prior to debridement (In Clinic Only). Skin Barriers/Peri-Wound Care Wound #1 Left,Proximal,Medial Lower Leg o Barrier cream Wound #2 Left,Lateral,Posterior Lower Leg o Barrier cream Wound #3 Left,Medial,Anterior Lower Leg o Barrier cream Primary Wound Dressing Wound #1 Left,Proximal,Medial Lower Leg o Silver Alginate Wound #2 Left,Lateral,Posterior Lower Leg o Silver Alginate Wound #3 Left,Medial,Anterior Lower Leg o Silver Alginate Secondary Dressing Wound #1 Left,Proximal,Medial Lower Leg o ABD pad o XtraSorb [name], [name] ([id number]) Wound #2 Left,Lateral,Posterior Lower Leg o ABD pad o XtraSorb Wound #3 Left,Medial,Anterior Lower Leg o ABD pad o XtraSorb Dressing Change Frequency Wound #1 Left,Proximal,Medial Lower Leg o Change dressing every week Wound #2 Left,Lateral,Posterior Lower Leg o Change dressing every week Wound #3 Left,Medial,Anterior Lower Leg o Change dressing every week Follow-up Appointments Wound #1 Left,Proximal,Medial Lower Leg o Return Appointment in 1 week. Wound #2 Left,Lateral,Posterior Lower Leg o Return Appointment in 1 week. Wound #3 Left,Medial,Anterior Lower Leg o Return Appointment in 1 week. Edema Control Wound #1 Left,Proximal,Medial Lower Leg o 4-Layer Compression System - Left Lower Extremity Wound #2 Left,Lateral,Posterior Lower Leg o 4-Layer Compression System - Left Lower Extremity Wound #3 Left,Medial,Anterior Lower Leg o 4-Layer Compression System - Left Lower Extremity Home Health Wound #1 Left,Proximal,Medial Lower Leg o Continue  Home Health Visits o Home Health Nurse may visit PRN to address patientos wound care needs. o FACE TO FACE ENCOUNTER: MEDICARE and MEDICAID PATIENTS: I certify that this patient is under my care and that I had a face-to-face encounter that meets the physician face-to-face encounter requirements with this patient on this date. The encounter with the patient was in whole or in part for the following MEDICAL CONDITION: (primary reason for [place]) MEDICAL NECESSITY: I certify, that based on my findings, NURSING services are a medically necessary home health service. HOME BOUND STATUS: I certify that my clinical findings support that this patient is homebound (i.e., Due to illness or injury, pt requires aid of supportive devices such as crutches, cane, wheelchairs, walkers, the use of special transportation or the assistance of another person to leave their place of residence. There is a normal inability to leave the home and doing so requires considerable and taxing effort. Other absences are for medical reasons / religious services and are infrequent or of short duration when for other reasons). o If current dressing causes regression in wound condition, may D/C ordered dressing product/s and apply Normal Saline Moist Dressing daily until next [place] / Other MD appointment. [place] of regression in wound condition at [phone]. TRAVARIS, KOSH ([id number]) Wound #2 [place] Nurse may visit PRN to address patientos wound care needs. o FACE TO FACE ENCOUNTER: MEDICARE and MEDICAID PATIENTS: I certify that this patient is under my care and that I had a face-to-face encounter that meets the physician face-to-face encounter requirements with this patient on this date. The encounter with the patient was in whole or in part for the following MEDICAL CONDITION: (primary reason for  [place]) MEDICAL NECESSITY: I certify, that based on my findings, NURSING services are a medically necessary home health service. HOME BOUND STATUS: I certify that my clinical findings support that this patient is homebound (i.e., Due to illness or injury, pt requires aid of supportive devices such as crutches, cane, wheelchairs, walkers, the use of  special transportation or the assistance of another person to leave their place of residence. There is a normal inability to leave the home and doing so requires considerable and taxing effort. Other absences are for medical reasons / religious services and are infrequent or of short duration when for other reasons). o If current dressing causes regression in wound condition, may D/C ordered dressing product/s and apply Normal Saline Moist Dressing daily until next [place] / Other MD appointment. [place] of regression in wound condition at [phone]. Wound #3 Left,Medial,Anterior Lower Leg o [place] Nurse may visit PRN to address patientos wound care needs. o FACE TO FACE ENCOUNTER: MEDICARE and MEDICAID PATIENTS: I certify that this patient is under my care and that I had a face-to-face encounter that meets the physician face-to-face encounter requirements with this patient on this date. The encounter with the patient was in whole or in part for the following MEDICAL CONDITION: (primary reason for [place]) MEDICAL NECESSITY: I certify, that based on my findings, NURSING services are a medically necessary home health service. HOME BOUND STATUS: I certify that my clinical findings support that this patient is homebound (i.e., Due to illness or injury, pt requires aid of supportive devices such as crutches, cane, wheelchairs, walkers, the use of special transportation or the assistance of another person to leave their place of residence. There is a normal  inability to leave the home and doing so requires considerable and taxing effort. Other absences are for medical reasons / religious services and are infrequent or of short duration when for other reasons). o If current dressing causes regression in wound condition, may D/C ordered dressing product/s and apply Normal Saline Moist Dressing daily until next [place] / Other MD appointment. [place] of regression in wound condition at [phone]. Medications-please add to medication list. Wound #1 Left,Proximal,Medial Lower Leg o P.O. Antibiotics - Continue antibiotics Wound #2 Left,Lateral,Posterior Lower Leg o P.O. Antibiotics - Continue antibiotics Wound #3 Left,Medial,Anterior Lower Leg o P.O. Antibiotics - Continue antibiotics Electronic Signature(s) Signed: 01/11/2018 4:50:03 PM By: Linton Ham MD Signed: 01/11/2018 4:52:44 PM By: Gretta Cool, BSN, RN, CWS, Kim RN, BSN Entered By: Gretta Cool, BSN, RN, CWS, Kim on 01/11/2018 09:51:49 [name], [name] ([id number]) -------------------------------------------------------------------------------- Problem List Details Patient Name: [name], [name] Date of Service: 01/11/2018 9:00 AM Medical Record Number: [id number] Patient Account Number: [id number] Date of Birth/Sex: May 10, 1971 (47 y.o. 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 ([id number]) -------------------------------------------------------------------------------- Progress Note Details Patient Name: Moya, Cobin A. Date of Service: 01/11/2018 9:00 AM Medical Record Number: [id number] Patient Account Number: [id number] Date of Birth/Sex: 11-03-1970 (47 y.o. M) Treating RN: Cornell Barman Primary Care Provider: Gaetano Net Other Clinician: Referring Provider: Gaetano Net Treating Provider/Extender: Tito Dine in Treatment: 2 Subjective History of Present Illness (HPI) 12/28/17; this is a now 47 year old man  who is a type II diabetic. He was hospitalized from 10/01/17 through 10/19/17. He had an MSSA soft tissue and skin infection. 2 open areas on the left leg were identified he has a smaller area on the left medial calf superiorly just below the knee and a wound just above the left ankle on the posterior medial aspect. I think both of these were surgical IandD sites when he was in the hospital. He was discharged with a wound VAC at that point however this is since been taken off. He follows with Dr. Ola Spurr for the [facility] and he is still on chronic Keflex at 500 twice a day. At that time he was hospitalized his hemoglobin A1c was 15.1 however if I'm reading his endocrinologist notes correctly that is improved. He has been following with Dr. [name] at [facility] and he has been applying calcium alginate and Unna boots. He has home health changing the dressing. They have also been attempting to get him external compression pumps although the patient is unaware whether they've been approved by insurance at this point. as mentioned he has a smaller clean wound on the right lateral calf just below the knee and he has a much larger area just above the left ankle medially and  posteriorly. Our intake nurse reported greenish purulent looking drainage.the patient did have surgical material sent to pathology in February. This showed chronic abscess The patient also has lymphedema stage III in the left greater than right lower extremities. He has a history of blisters with wounds but these of all were always healed. The patient thinks that the lymphedema may have been present since he was about 47 years old i.e. about 30 years. He does not have graded pressure stockings and has not worn stockings. He does not have a distant history of DVT PE or phlebitis. He has not been systemically unwell fever no chills. He states that his Lasix is recently been reduced. He tells me his kidney function is at "30%" and he has been followed by Dr. [name] of nephrology. At one point he was on Lasix 80 twice a day however that's been cut back and he is now on Lasix at 20 twice a day. The patient has a history of PAD listed in his records although he comes from Dr. [name] I don't think is felt to have significant PAD. ABIs in our clinic were noncompressible bilaterally. 01/04/18; patient has a large wound on the left lateral lower calf and a small wound on the left medial upper calf. He has been to see his nephrologist who changed him to Demadex 40 mg a day. I'm hopeful this will help with his systemic fluid overload. He also has stage III lymphedema. Really no change in the 2 wounds since last week 01/11/18; the patient is down 13 pounds. He put his stage III lymphedema left leg in 4K compression last week and there is less edema fluid however we still haven't been able to communicate with home health but [facility] but the dressings have not been changed. The patient noted an odor last week. He is also had compression pumps ordered by [facility] this as a not completed the paperwork stage. Objective Constitutional Patient is hypertensive.. Pulse regular and  within target range for patient.Marland [name] Respirations regular, non-labored and within target range.. Temperature is normal and within the target range for the patient.Marland [name] appears in no distress. Siedschlag, Tara A. ([id number]) Vitals Time Taken: 9:00 AM, Height: 76  in, Weight: 364.4 lbs, BMI: 44.4, Temperature: 98.0 F, Pulse: 56 bpm, Respiratory Rate: 16 breaths/min, Blood Pressure: 168/79 mmHg. General Notes: wound exam; Small healthy wound on the left upper medial calf has closed over On the left posterior calf the surface of this had necrotic subcutaneous tissue over the majority of the wound. Using a curette this was debrided to a better looking wound surface. Small wound on the left anterior looks healthy. No debridement is required Integumentary (Hair, Skin) Wound #1 status is Open. Original cause of wound was Gradually Appeared. The wound is located on the Left,Proximal,Medial Lower Leg. The wound measures 1cm length x 1cm width x 0.1cm depth; 0.785cm^2 area and 0.079cm^3 volume. There is Fat Layer (Subcutaneous Tissue) Exposed exposed. There is no tunneling or undermining noted. There is a large amount of serosanguineous drainage noted. Foul odor after cleansing was noted. The wound margin is distinct with the outline attached to the wound base. There is large (67-100%) red granulation within the wound bed. There is a small (1-33%) amount of necrotic tissue within the wound bed including Adherent Slough. The periwound skin appearance exhibited: Dry/Scaly, Hemosiderin Staining. The periwound skin appearance did not exhibit: Callus, Crepitus, Excoriation, Induration, Rash, Scarring, Maceration, Atrophie Blanche, Cyanosis, Ecchymosis, Mottled, Pallor, Rubor, Erythema. Periwound temperature was noted as No Abnormality. Wound #2 status is Open. Original cause of wound was Gradually Appeared. The wound is located on the Left,Lateral,Posterior Lower Leg. The wound measures 4.5cm length x 8cm width x 0.8cm  depth; 28.274cm^2 area and 22.619cm^3 volume. There is Fat Layer (Subcutaneous Tissue) Exposed exposed. There is no tunneling or undermining noted. There is a large amount of purulent drainage noted. Foul odor after cleansing was noted. The wound margin is distinct with the outline attached to the wound base. There is medium (34-66%) red, pink granulation within the wound bed. There is a medium (34-66%) amount of necrotic tissue within the wound bed including Eschar and Adherent Slough. The periwound skin appearance exhibited: Maceration. The periwound skin appearance did not exhibit: Callus, Crepitus, Excoriation, Induration, Rash, Scarring, Dry/Scaly, Atrophie Blanche, Cyanosis, Ecchymosis, Hemosiderin Staining, Mottled, Pallor, Rubor, Erythema. Periwound temperature was noted as No Abnormality. The periwound has tenderness on palpation. Wound #3 status is Open. Original cause of wound was Gradually Appeared. The wound is located on the Left,Medial,Anterior Lower Leg. The wound measures 0.6cm length x 1cm width x 0.1cm depth; 0.471cm^2 area and 0.047cm^3 volume. There is no tunneling or undermining noted. There is a none present amount of drainage noted. The wound margin is flat and intact. There is no granulation within the wound bed. There is a large (67-100%) amount of necrotic tissue within the wound bed including Eschar. The periwound skin appearance did not exhibit: Callus, Crepitus, Excoriation, Induration, Rash, Scarring, Dry/Scaly, Maceration, Atrophie Blanche, Cyanosis, Ecchymosis, Hemosiderin Staining, Mottled, Pallor, Rubor, Erythema. Assessment Active Problems ICD-10 L97.223 - Non-pressure chronic ulcer of left calf with necrosis of muscle I89.0 - Lymphedema, not elsewhere classified I87.332 - Chronic venous hypertension (idiopathic) with ulcer and inflammation of left lower extremity E11.622 - Type 2 diabetes mellitus with other skin ulcer Procedures [name], [name]  ([id number]) Wound #2 Pre-procedure diagnosis of Wound #2 is a Diabetic Wound/Ulcer of the Lower Extremity located on the Left,Lateral,Posterior Lower Leg .Severity of Tissue Pre Debridement is: Fat layer exposed. There was a Excisional Skin/Subcutaneous Tissue Debridement with a total area of 36 sq cm performed by Ricard Dillon, MD. With the following instrument(s): Curette to remove Viable and Non-Viable tissue/material.  Material removed includes Subcutaneous Tissue after achieving pain control using Other (lidociane 4%). No specimens were taken. A time out was conducted at 09:45, prior to the start of the procedure. A Moderate amount of bleeding was controlled with Pressure. The procedure was tolerated well with a pain level of 0 throughout and a pain level of 0 following the procedure. Patient s Level of Consciousness post procedure was recorded as Awake and Alert and post-procedure vitals were taken including Temperature: 98 F, Pulse: 56 bpm, Respiratory Rate: 16 breaths/min, Blood Pressure: (168)/(79) mmHg. Post Debridement Measurements: 4.5cm length x 8cm width x 0.8cm depth; 22.619cm^3 volume. Character of Wound/Ulcer Post Debridement requires further debridement. Severity of Tissue Post Debridement is: Fat layer exposed. Post procedure Diagnosis Wound #2: Same as Pre-Procedure Plan Wound Cleansing: Wound #1 Left,Proximal,Medial Lower Leg: Clean wound with Normal Saline. Wound #2 Left,Lateral,Posterior Lower Leg: Clean wound with Normal Saline. Wound #3 Left,Medial,Anterior Lower Leg: Clean wound with Normal Saline. Anesthetic (add to Medication List): Wound #1 Left,Proximal,Medial Lower Leg: Topical Lidocaine 4% cream applied to wound bed prior to debridement (In Clinic Only). Wound #2 Left,Lateral,Posterior Lower Leg: Topical Lidocaine 4% cream applied to wound bed prior to debridement (In Clinic Only). Wound #3 Left,Medial,Anterior Lower Leg: Topical Lidocaine 4% cream  applied to wound bed prior to debridement (In Clinic Only). Skin Barriers/Peri-Wound Care: Wound #1 Left,Proximal,Medial Lower Leg: Barrier cream Wound #2 Left,Lateral,Posterior Lower Leg: Barrier cream Wound #3 Left,Medial,Anterior Lower Leg: Barrier cream Primary Wound Dressing: Wound #1 Left,Proximal,Medial Lower Leg: Silver Alginate Wound #2 Left,Lateral,Posterior Lower Leg: Silver Alginate Wound #3 Left,Medial,Anterior Lower Leg: Silver Alginate Secondary Dressing: Wound #1 Left,Proximal,Medial Lower Leg: ABD pad XtraSorb Wound #2 Left,Lateral,Posterior Lower Leg: ABD pad XtraSorb Wound #3 Left,Medial,Anterior Lower Leg: [name], Daris A. ([id number]) ABD pad XtraSorb Dressing Change Frequency: Wound #1 Left,Proximal,Medial Lower Leg: Change dressing every week Wound #2 Left,Lateral,Posterior Lower Leg: Change dressing every week Wound #3 Left,Medial,Anterior Lower Leg: Change dressing every week Follow-up Appointments: Wound #1 Left,Proximal,Medial Lower Leg: Return Appointment in 1 week. Wound #2 Left,Lateral,Posterior Lower Leg: Return Appointment in 1 week. Wound #3 Left,Medial,Anterior Lower Leg: Return Appointment in 1 week. Edema Control: Wound #1 Left,Proximal,Medial Lower Leg: 4-Layer Compression System - Left Lower Extremity Wound #2 Left,Lateral,Posterior Lower Leg: 4-Layer Compression System - Left Lower Extremity Wound #3 Left,Medial,Anterior Lower Leg: 4-Layer Compression System - Left Lower Extremity Home Health: Wound #1 Left,Proximal,Medial Lower Leg: [place] Nurse may visit PRN to address patient s wound care needs. FACE TO FACE ENCOUNTER: MEDICARE and MEDICAID PATIENTS: I certify that this patient is under my care and that I had a face-to-face encounter that meets the physician face-to-face encounter requirements with this patient on this date. The encounter with the patient was in whole or in part for the  following MEDICAL CONDITION: (primary reason for [place]) MEDICAL NECESSITY: I certify, that based on my findings, NURSING services are a medically necessary home health service. HOME BOUND STATUS: I certify that my clinical findings support that this patient is homebound (i.e., Due to illness or injury, pt requires aid of supportive devices such as crutches, cane, wheelchairs, walkers, the use of special transportation or the assistance of another person to leave their place of residence. There is a normal inability to leave the home and doing so requires considerable and taxing effort. Other absences are for medical reasons / religious services and are infrequent or of short duration when for other reasons). If current dressing causes regression in  wound condition, may D/C ordered dressing product/s and apply Normal Saline Moist Dressing daily until next [place] / Other MD appointment. [place] of regression in wound condition at [phone]. Wound #2 Left,Lateral,Posterior Lower Leg: [place] Nurse may visit PRN to address patient s wound care needs. FACE TO FACE ENCOUNTER: MEDICARE and MEDICAID PATIENTS: I certify that this patient is under my care and that I had a face-to-face encounter that meets the physician face-to-face encounter requirements with this patient on this date. The encounter with the patient was in whole or in part for the following MEDICAL CONDITION: (primary reason for [place]) MEDICAL NECESSITY: I certify, that based on my findings, NURSING services are a medically necessary home health service. HOME BOUND STATUS: I certify that my clinical findings support that this patient is homebound (i.e., Due to illness or injury, pt requires aid of supportive devices such as crutches, cane, wheelchairs, walkers, the use of special transportation or the assistance of another person to leave their place of  residence. There is a normal inability to leave the home and doing so requires considerable and taxing effort. Other absences are for medical reasons / religious services and are infrequent or of short duration when for other reasons). If current dressing causes regression in wound condition, may D/C ordered dressing product/s and apply Normal Saline Moist Dressing daily until next [place] / Other MD appointment. [place] of regression in wound condition at [phone]. Wound #3 Left,Medial,Anterior Lower Leg: [place] Nurse may visit PRN to address patient s wound care needs. FACE TO FACE ENCOUNTER: MEDICARE and MEDICAID PATIENTS: I certify that this patient is under my care and that I had a face-to-face encounter that meets the physician face-to-face encounter requirements with this patient on this date. The encounter with the patient was in whole or in part for the following MEDICAL CONDITION: (primary reason for Home Zendejas, Devaughn A. ([id number]) Healthcare) MEDICAL NECESSITY: I certify, that based on my findings, NURSING services are a medically necessary home health service. HOME BOUND STATUS: I certify that my clinical findings support that this patient is homebound (i.e., Due to illness or injury, pt requires aid of supportive devices such as crutches, cane, wheelchairs, walkers, the use of special transportation or the assistance of another person to leave their place of residence. There is a normal inability to leave the home and doing so requires considerable and taxing effort. Other absences are for medical reasons / religious services and are infrequent or of short duration when for other reasons). If current dressing causes regression in wound condition, may D/C ordered dressing product/s and apply Normal Saline Moist Dressing daily until next [place] / Other MD appointment. [place] of  regression in wound condition at [phone]. Medications-please add to medication list.: Wound #1 Left,Proximal,Medial Lower Leg: P.O. Antibiotics - Continue antibiotics Wound #2 Left,Lateral,Posterior Lower Leg: P.O. Antibiotics - Continue antibiotics Wound #3 Left,Medial,Anterior Lower Leg: P.O. Antibiotics - Continue antibiotics #1 continuous silver alginate all the wounds on the left leg. The leg is in for layer compression. She needs these changed by home health. After some discussion today we are able discover [facility] was [facility]. We'll send orders into them #2 he definitely needs compression pumps. This is been ordered by vein and vascular. I think it is in the paperwork stage by the sound of it. #3 the patient has diuresed  nicely on Demadex 40 down 13 pounds. He has stage 3 lymphedema, his leg is still in need of further compression Electronic Signature(s) Signed: 01/11/2018 4:50:03 PM By: Linton Ham MD Entered By: Linton Ham on 01/11/2018 10:22:19 Huizinga, Lochlann A. ([id number]) -------------------------------------------------------------------------------- SuperBill Details Patient Name: Klutts, Aristotle A. Date of Service: 01/11/2018 Medical Record Number: [id number] Patient Account Number: [id number] Date of Birth/Sex: 10-16-1970 (47 y.o. M) Treating RN: Cornell Barman Primary Care Provider: Gaetano Net Other Clinician: Referring Provider: Gaetano Net Treating Provider/Extender: Tito Dine in Treatment: 2 Diagnosis Coding ICD-10 Codes Code Description [phone] Non-pressure chronic ulcer of left calf with necrosis of muscle I89.0 Lymphedema, not elsewhere classified I87.332 Chronic venous hypertension (idiopathic) with ulcer and inflammation of left lower extremity E11.622 Type 2 diabetes mellitus with other skin ulcer Facility Procedures CPT4 Code: 82800349 Description: 17915 - DEB SUBQ TISSUE 20 SQ CM/< ICD-10 Diagnosis Description L97.223  Non-pressure chronic ulcer of left calf with necrosis of mu Modifier: scle Quantity: 1 CPT4 Code: [id number] Description: 01655 - DEB SUBQ TISS EA ADDL 20CM ICD-10 Diagnosis Description L97.223 Non-pressure chronic ulcer of left calf with necrosis of mu Modifier: scle Quantity: 1 Physician Procedures CPT4 Code: [id number] Description: 78675 - WC PHYS SUBQ TISS 20 SQ CM ICD-10 Diagnosis Description L97.223 Non-pressure chronic ulcer of left calf with necrosis of mus Modifier: cle Quantity: 1 CPT4 Code: 4492010 Description: 07121 - WC PHYS SUBQ TISS EA ADDL 20 CM ICD-10 Diagnosis Description L97.223 Non-pressure chronic ulcer of left calf with necrosis of mus Modifier: cle Quantity: 1 Electronic Signature(s) Signed: 01/11/2018 4:50:03 PM By: Linton Ham MD Entered By: Linton Ham on 01/11/2018 10:23:24

## 2018-01-18 ENCOUNTER — Encounter: Payer: BLUE CROSS/BLUE SHIELD | Admitting: Internal Medicine

## 2018-01-18 DIAGNOSIS — E11622 Type 2 diabetes mellitus with other skin ulcer: Secondary | ICD-10-CM | POA: Diagnosis not present

## 2018-01-19 ENCOUNTER — Ambulatory Visit (INDEPENDENT_AMBULATORY_CARE_PROVIDER_SITE_OTHER): Payer: BLUE CROSS/BLUE SHIELD | Admitting: Vascular Surgery

## 2018-01-19 ENCOUNTER — Encounter (INDEPENDENT_AMBULATORY_CARE_PROVIDER_SITE_OTHER): Payer: Self-pay | Admitting: Vascular Surgery

## 2018-01-19 VITALS — BP 169/86 | HR 60 | Resp 17 | Ht 76.0 in | Wt 358.6 lb

## 2018-01-19 DIAGNOSIS — I872 Venous insufficiency (chronic) (peripheral): Secondary | ICD-10-CM | POA: Diagnosis not present

## 2018-01-19 DIAGNOSIS — E119 Type 2 diabetes mellitus without complications: Secondary | ICD-10-CM

## 2018-01-19 DIAGNOSIS — I10 Essential (primary) hypertension: Secondary | ICD-10-CM

## 2018-01-19 DIAGNOSIS — L97909 Non-pressure chronic ulcer of unspecified part of unspecified lower leg with unspecified severity: Principal | ICD-10-CM

## 2018-01-19 DIAGNOSIS — I83022 Varicose veins of left lower extremity with ulcer of calf: Secondary | ICD-10-CM | POA: Diagnosis not present

## 2018-01-19 DIAGNOSIS — L97229 Non-pressure chronic ulcer of left calf with unspecified severity: Secondary | ICD-10-CM

## 2018-01-19 DIAGNOSIS — I890 Lymphedema, not elsewhere classified: Secondary | ICD-10-CM

## 2018-01-19 DIAGNOSIS — I83009 Varicose veins of unspecified lower extremity with ulcer of unspecified site: Secondary | ICD-10-CM

## 2018-01-19 NOTE — Progress Notes (Signed)
MRN : [id number]  [name] is a 47 y.o. (09-29-1970) male who presents with chief complaint of  Chief Complaint  Patient presents with  . Follow-up    3-4wk with left leg  .  History of Present Illness:   Patient is seen for follow up evaluation of leg pain and swelling associated with venous ulceration.Since his hospitalization he has been treated as an outpatient with a VAC.The swelling has beenworse on the left legand is associated with pain and discoloration. The patient is unable to wear his compression because of the VAC and the hose. He states the hose itself is now caused a new ulcer. The pain and swelling worsens with prolonged dependency and improves with elevation. The patient notes that in the morning the legs are better but the leg symptoms worsened throughout the course of the day. The patient has also noted a progressive worsening of the discoloration in the ankle and shin area.   The patient notes that they were not able to tolerate the Unna boot and removed it several days ago.  The patient states that they have been elevating as much as possible. The patient denies any recent changes in medications.  The patient denies a history of DVT or PE. There is no prior history of phlebitis. There is no history of primary lymphedema.  No SOB or increased cough. No sputum production. No recent episodes of CHF exacerbation.    Current Meds  Medication Sig  . amLODipine (NORVASC) 10 MG tablet Take by mouth.  [name] atenolol (TENORMIN) 100 MG tablet Take 1 tablet (100 mg total) by mouth daily.  . cephALEXin (KEFLEX) 500 MG capsule Take 500 mg by mouth 2 (two) times daily.  . furosemide (LASIX) 40 MG tablet Take 1 tablet (40 mg total) by mouth 2 (two) times daily. (Patient taking differently: Take 40 mg by mouth daily. )  . gentamicin cream (GARAMYCIN) 0.1 % Apply 1 application topically 3 (three) times daily.  . hydrALAZINE (APRESOLINE) 25 MG tablet Take 50  mg by mouth 3 (three) times daily.   . Insulin Glargine (LANTUS SOLOSTAR) 100 UNIT/ML Solostar Pen Inject into the skin.  [name] JARDIANCE 25 MG TABS tablet TAKE 1 TABLET BY MOUTH EVERY DAY IN THE MORNING  . metFORMIN (GLUCOPHAGE-XR) 500 MG 24 hr tablet TAKE 2 TABLETS (1,000 MG TOTAL) BY MOUTH ONCE DAILY.(NEED APPT FOR FURTHER REFILLS)    Past Medical History:  Diagnosis Date  . Diabetes mellitus without complication ([place])   . Hypertension   . Microalbuminuria   . Vitamin D deficiency     Past Surgical History:  Procedure Laterality Date  . APPLICATION OF WOUND VAC Left 10/03/2017   Procedure: APPLICATION OF WOUND VAC;  Surgeon: Algernon Huxley, MD;  Location: [facility];  Service: General;  Laterality: Left;  . APPLICATION OF WOUND VAC Left 10/11/2017   Procedure: APPLICATION OF WOUND VAC;  Surgeon: Katha Cabal, MD;  Location: [facility];  Service: Vascular;  Laterality: Left;  . APPLICATION OF WOUND VAC Left 10/14/2017   Procedure: WOUND VAC CHANGE;  Surgeon: Katha Cabal, MD;  Location: [facility];  Service: Vascular;  Laterality: Left;  left lower leg  . APPLICATION OF WOUND VAC Left 10/18/2017   Procedure: WOUND VAC CHANGE;  Surgeon: Katha Cabal, MD;  Location: [facility];  Service: Vascular;  Laterality: Left;  . APPLICATION OF WOUND VAC Left 10/07/2017   Procedure: APPLICATION OF WOUND VAC;  Surgeon: Katha Cabal, MD;  Location: [facility];  Service: Vascular;  Laterality: Left;  . I&D EXTREMITY Left 10/11/2017   Procedure: IRRIGATION AND DEBRIDEMENT EXTREMITY;  Surgeon: Katha Cabal, MD;  Location: [facility];  Service: Vascular;  Laterality: Left;  . INCISION AND DRAINAGE ABSCESS Right 10/07/2017   Procedure: INCISION AND DRAINAGE ABSCESS;  Surgeon: Katha Cabal, MD;  Location: [facility];  Service: Vascular;  Laterality: Right;  . IRRIGATION AND DEBRIDEMENT ABSCESS Left 10/03/2017   Procedure: IRRIGATION AND DEBRIDEMENT ABSCESS with debridement of skin, soft tissue,  muscle 50sq cm;  Surgeon: Algernon Huxley, MD;  Location: [facility];  Service: General;  Laterality: Left;    Social History Social History   Tobacco Use  . Smoking status: Never Smoker  . Smokeless tobacco: Never Used  Substance Use Topics  . Alcohol use: Yes    Comment: beer  . Drug use: Not on file    Family History Family History  Problem Relation Age of Onset  . Diabetes Father   . Kidney disease Father   . Diabetes Daughter     Allergies  Allergen Reactions  . Lisinopril Swelling  . Furosemide Other (See Comments)    Light headed, cough, blurry vision, weakness.     REVIEW OF SYSTEMS (Negative unless checked)  Constitutional: [] Weight loss  [] Fever  [] Chills Cardiac: [] Chest pain   [] Chest pressure   [] Palpitations   [] Shortness of breath when laying flat   [] Shortness of breath with exertion. Vascular:  [] Pain in legs with walking   [x] Pain in legs at rest  [] History of DVT   [] Phlebitis   [x] Swelling in legs   [] Varicose veins   [] Non-healing ulcers Pulmonary:   [] Uses home oxygen   [] Productive cough   [] Hemoptysis   [] Wheeze  [] COPD   [] Asthma Neurologic:  [] Dizziness   [] Seizures   [] History of stroke   [] History of TIA  [] Aphasia   [] Vissual changes   [] Weakness or numbness in arm   [] Weakness or numbness in leg Musculoskeletal:   [] Joint swelling   [] Joint pain   [] Low back pain Hematologic:  [] Easy bruising  [] Easy bleeding   [] Hypercoagulable state   [] Anemic Gastrointestinal:  [] Diarrhea   [] Vomiting  [] Gastroesophageal reflux/heartburn   [] Difficulty swallowing. Genitourinary:  [] Chronic kidney disease   [] Difficult urination  [] Frequent urination   [] Blood in urine Skin:  [x] Rashes   [x] Ulcers  Psychological:  [] History of anxiety   []  History of major depression.  Physical Examination  Vitals:   01/19/18 0848  BP: (!) 169/86  Pulse: 60  Resp: 17  Weight: (!) 358 lb 9.6 oz (162.7 kg)  Height: 6\' 4"  (1.93 m)   Body mass index is 43.65 kg/m. Gen:  WD/WN, NAD Head: [place]/AT, No temporalis wasting.  Ear/Nose/Throat: Hearing grossly intact, nares w/o erythema or drainage Eyes: PER, EOMI, sclera nonicteric.  Neck: Supple, no large masses.   Pulmonary:  Good air movement, no audible wheezing bilaterally, no use of accessory muscles.  Cardiac: RRR, no JVD Vascular: 2-3+ edema of the left leg with severe venous changes of the right leg.  Venous ulcer noted in the ankle area on the left, noninfected and much smaller than last exam Vessel Right Left  Radial Palpable Palpable  PT Palpable Palpable  DP Palpable Palpable  Gastrointestinal: Non-distended. No guarding/no peritoneal signs.  Musculoskeletal: M/S 5/5 throughout.  No deformity or atrophy.  Neurologic: CN 2-12 intact. Symmetrical.  Speech is fluent. Motor exam as listed above. Psychiatric: Judgment intact, Mood & affect appropriate  for pt's clinical situation. Dermatologic: Venous stasis dermatitis with ulcers present on the right.  No changes consistent with cellulitis. Lymph : No lichenification or skin changes of chronic lymphedema.  CBC Lab Results  Component Value Date   WBC 5.9 10/19/2017   HGB 8.4 (L) 10/19/2017   HCT 25.6 (L) 10/19/2017   MCV 89.1 10/19/2017   PLT 320 10/19/2017    BMET    Component Value Date/Time   NA 139 10/19/2017 0311   K 4.2 10/19/2017 0311   CL 107 10/19/2017 0311   CO2 24 10/19/2017 0311   GLUCOSE 109 (H) 10/19/2017 0311   BUN 37 (H) 10/19/2017 0311   CREATININE 2.11 (H) 10/19/2017 0311   CALCIUM 8.6 (L) 10/19/2017 0311   GFRNONAA 35 (L) 10/19/2017 0311   GFRAA 41 (L) 10/19/2017 0311   CrCl cannot be calculated (Patient's most recent lab result is older than the maximum 21 days allowed.).  COAG Lab Results  Component Value Date   INR 1.05 10/14/2017   INR 1.16 10/06/2017    Radiology No results found.    Assessment/Plan 1. Venous ulcer ([place]) No surgery or intervention at this point in time.    I have had a long  discussion with the patient regarding venous insufficiency and why it  causes symptoms, specifically venous ulceration . I have discussed with the patient the chronic skin changes that accompany venous insufficiency and the long term sequela such as infection and recurring  ulceration.  Patient will be placed in [employer] which will be changed weekly drainage permitting.  The patient is very concerned about the length of time that it is taking for this wound to heal.  He also feels that he is retaining fluid.  He is concerned that he is short of breath with laying flat he is concerned that he is short of breath with walking.  He states that he has been assessed for sleep apnea but this is not something that will help him at this time.  I discussed at length skin grafting to expedite wound healing but also informed him the risk that it would not take.  He is not willing to undergo this surgery.  I have contacted Dr. [name] directly to expedite a visit with Dr. [name] to assess his overall fluid status.  I have checked on the paperwork for his lymph pump and this is still pending secondary to insurance claim issues.  In addition, behavioral modification including several periods of elevation of the lower extremities during the day will be continued. Achieving a position with the ankles at heart level was stressed to the patient  The patient is instructed to begin routine exercise, especially walking on a daily basis  Patient should undergo duplex ultrasound of the venous system to ensure that DVT or reflux is not present.  Following the review of the ultrasound the patient will follow up in one week to reassess the degree of swelling and the control that Unna therapy is offering.   The patient can be assessed for graduated compression stockings or wraps as well as a Lymph Pump once the ulcers are healed.    2. Chronic venous insufficiency No surgery or intervention at this point in time.      I have had a long discussion with the patient regarding venous insufficiency and why it  causes symptoms. I have discussed with the patient the chronic skin changes that accompany venous insufficiency and the long term sequela such as infection and  ulceration.  Patient will begin wearing graduated compression stockings class 1 (20-30 mmHg) or compression wraps on a daily basis a prescription was given. The patient will put the stockings on first thing in the morning and removing them in the evening. The patient is instructed specifically not to sleep in the stockings.    In addition, behavioral modification including several periods of elevation of the lower extremities during the day will be continued. I have demonstrated that proper elevation is a position with the ankles at heart level.  The patient is instructed to begin routine exercise, especially walking on a daily basis  3. Lymphedema  No surgery or intervention at this point in time.    I have reviewed my discussion with the patient regarding lymphedema and why it  causes symptoms.  Patient will continue wearing graduated compression stockings class 1 (20-30 mmHg) on a daily basis a prescription was given. The patient is reminded to put the stockings on first thing in the morning and removing them in the evening. The patient is instructed specifically not to sleep in the stockings.   In addition, behavioral modification throughout the day will be continued.  This will include frequent elevation (such as in a recliner), use of over the counter pain medications as needed and exercise such as walking.  I have reviewed systemic causes for chronic edema such as liver, kidney and cardiac etiologies and there does not appear to be any significant changes in these organ systems over the past year.  The patient is under the impression that these organ systems are all stable and unchanged.    The patient will continue aggressive use of the  lymph  pump.  This will continue to improve the edema control and prevent sequela such as ulcers and infections.   4. Essential hypertension Continue antihypertensive medications as already ordered, these medications have been reviewed and there are no changes at this time.   5. Diabetes mellitus without complication ([place]) Continue hypoglycemic medications as already ordered, these medications have been reviewed and there are no changes at this time.  Hgb A1C to be monitored as already arranged by primary service    [name], MD  01/19/2018 8:59 AM

## 2018-01-20 NOTE — Progress Notes (Signed)
HOYTE, ZIEBELL ([id number]) Visit Report for 01/18/2018 Debridement Details Patient Name: [name], [name] Date of Service: 01/18/2018 9:15 AM Medical Record Number: [id number] Patient Account Number: [id number] Date of Birth/Sex: [birth year] (48 y.o. M) Treating RN: Cornell Barman Primary Care Provider: Gaetano Net Other Clinician: Referring Provider: Gaetano Net Treating Provider/Extender: Tito Dine in Treatment: 3 Debridement Performed for Wound #3 Left,Medial,Anterior Lower Leg Assessment: Performed By: Physician Ricard Dillon, MD Debridement Type: Debridement Pre-procedure Verification/Time Yes - 10:21 Out Taken: Start Time: 10:21 Pain Control: Other : lidocaine 4% Total Area Debrided ([name] x W): 0.5 (cm) x 1 (cm) = 0.5 (cm) Tissue and other material Viable, Non-Viable, Slough, Subcutaneous, Slough debrided: Level: Skin/Subcutaneous Tissue Debridement Description: Excisional Instrument: Curette Bleeding: Minimum Hemostasis Achieved: Pressure End Time: 10:21 Procedural Pain: 2 Post Procedural Pain: 0 Response to Treatment: Procedure was tolerated well Level of Consciousness: Awake and Alert Post Procedure Vitals: Temperature: 97.7 Pulse: 57 Respiratory Rate: 16 Blood Pressure: Systolic Blood Pressure: 826 Diastolic Blood Pressure: 87 Post Debridement Measurements of Total Wound Length: (cm) 0.5 Width: (cm) 1 Depth: (cm) 0.2 Volume: (cm) 0.079 Character of Wound/Ulcer Post Debridement: Requires Further Debridement Post Procedure Diagnosis Same as Pre-procedure Electronic Signature(s) Signed: 01/18/2018 5:10:30 PM By: Linton Ham MD Signed: 01/18/2018 5:19:09 PM By: Gretta Cool BSN, RN, CWS, Kim RN, BSN Trumbo, Skeeter A. ([id number]) Entered By: Linton Ham on 01/18/2018 10:55:00 [name], [name] ([id number]) -------------------------------------------------------------------------------- HPI Details Patient Name: Daughenbaugh, Kashus A. Date of  Service: 01/18/2018 9:15 AM Medical Record Number: [id number] Patient Account Number: [id number] Date of Birth/Sex: 1970/10/03 (48 y.o. M) Treating RN: Cornell Barman Primary Care Provider: Gaetano Net Other Clinician: Referring Provider: Gaetano Net Treating Provider/Extender: Tito Dine in Treatment: 3 History of Present Illness HPI Description: 12/28/17; this is a now 47 year old man who is a type II diabetic. He was hospitalized from 10/01/17 through 10/19/17. He had an MSSA soft tissue and skin infection. 2 open areas on the left leg were identified he has a smaller area on the left medial calf superiorly just below the knee and a wound just above the left ankle on the posterior medial aspect. I think both of these were surgical IandD sites when he was in the hospital. He was discharged with a wound VAC at that point however this is since been taken off. He follows with Dr. Ola Spurr for the [facility] and he is still on chronic Keflex at 500 twice a day. At that time he was hospitalized his hemoglobin A1c was 15.1 however if I'm reading his endocrinologist notes correctly that is improved. He has been following with Dr. [name] at [facility] vascular and he has been applying calcium alginate and Unna boots. He has home health changing the dressing. They have also been attempting to get him external compression pumps although the patient is unaware whether they've been approved by insurance at this point. as mentioned he has a smaller clean wound on the right lateral calf just below the knee and he has a much larger area just above the left ankle medially and posteriorly. Our intake nurse reported greenish purulent looking drainage.the patient did have surgical material sent to pathology in February. This showed chronic abscess The patient also has lymphedema stage III in the left greater than right lower extremities. He has a history of blisters with wounds but these of all were always  healed. The patient thinks that the lymphedema may have been present since he was about 47 years old i.e.  about 30 years. He does not have graded pressure stockings and has not worn stockings. He does not have a distant history of DVT PE or phlebitis. He has not been systemically unwell fever no chills. He states that his Lasix is recently been reduced. He tells me his kidney function is at "30%" and he has been followed by Dr. [name] of nephrology. At one point he was on Lasix 80 twice a day however that's been cut back and he is now on Lasix at 20 twice a day. The patient has a history of PAD listed in his records although he comes from Dr. [name] I don't think is felt to have significant PAD. ABIs in our clinic were noncompressible bilaterally. 01/04/18; patient has a large wound on the left lateral lower calf and a small wound on the left medial upper calf. He has been to see his nephrologist who changed him to Demadex 40 mg a day. I'm hopeful this will help with his systemic fluid overload. He also has stage III lymphedema. Really no change in the 2 wounds since last week 01/11/18; the patient is down 13 pounds. He put his stage III lymphedema left leg in 4K compression last week and there is less edema fluid however we still haven't been able to communicate with home health but [facility] but the dressings have not been changed. The patient noted an odor last week. He is also had compression pumps ordered by [facility] this as a not completed the paperwork stage. 01/18/18; patient continues to lose weight. Stage III lymphedema in the left greater than right leg under for alert compression. The major wound is on the left lateral ankle area. He apparently has bilateral compression pumps being brought to his house, these were ordered by [facility] Notable for the fact today he had some blisters on the right anterior leg together with some skin nodules.  This is no doubt secondary to severe lymphedema. Electronic Signature(s) Signed: 01/18/2018 5:10:30 PM By: Linton Ham MD Entered By: Linton Ham on 01/18/2018 10:56:27 [name], [name] Kitchen ([id number]) -------------------------------------------------------------------------------- Physical Exam Details Patient Name: Selman, Tanay A. Date of Service: 01/18/2018 9:15 AM Medical Record Number: [id number] Patient Account Number: [id number] Date of Birth/Sex: Jul 23, 1971 (47 y.o. M) Treating RN: Cornell Barman Primary Care Provider: Gaetano Net Other Clinician: Referring Provider: Gaetano Net Treating Provider/Extender: Tito Dine in Treatment: 3 Constitutional Patient is hypertensive.. Pulse regular and within target range for patient.[name] Respirations regular, non-labored and within target range.. Temperature is normal and within the target range for the patient.[name] appears in no distress. Eyes Conjunctivae clear. No discharge. Cardiovascular Pedal pulses palpable bilaterally. Edema present in both extremities. severe stage III lymphedema left greater than right.. Lymphatic none palpable in the popliteal or inguinal area. Integumentary (Hair, Skin) no primary skin issues are seen other than the lymphedema bilaterally in his lower legs. Notes when exam oThe upper medial calf wound was once again opened. Using a #5 curet and removed necrotic eschar and subcutaneous tissue. Hemostasis with direct pressure oOn the left posterior calf everything is closed over. oThe major area is on the left lateral ankle. This is smaller. Surface of the wound looks satisfactory. No debridement was done. Electronic Signature(s) Signed: 01/18/2018 5:10:30 PM By: Linton Ham MD Entered By: Linton Ham on 01/18/2018 11:23:20 [name], [name] Kitchen ([id number]) -------------------------------------------------------------------------------- Physician Orders Details Patient Name: [name], [name]  A. Date of Service: 01/18/2018 9:15 AM Medical Record Number: [id number] Patient  Account Number: [id number] Date of Birth/Sex: 14-Apr-1971 (47 y.o. M) Treating RN: Cornell Barman Primary Care Provider: Gaetano Net Other Clinician: Referring Provider: Gaetano Net Treating Provider/Extender: Tito Dine in Treatment: 3 Verbal / Phone Orders: No Diagnosis Coding Anesthetic (add to Medication List) Wound #1 Left,Proximal,Medial Lower Leg o Topical Lidocaine 4% cream applied to wound bed prior to debridement (In Clinic Only). Wound #2 Left,Lateral,Posterior Lower Leg o Topical Lidocaine 4% cream applied to wound bed prior to debridement (In Clinic Only). Wound #3 Left,Medial,Anterior Lower Leg o Topical Lidocaine 4% cream applied to wound bed prior to debridement (In Clinic Only). Skin Barriers/Peri-Wound Care Wound #1 Left,Proximal,Medial Lower Leg o Barrier cream Wound #2 Left,Lateral,Posterior Lower Leg o Barrier cream Wound #3 Left,Medial,Anterior Lower Leg o Barrier cream Primary Wound Dressing Wound #1 Left,Proximal,Medial Lower Leg o Silver Alginate Wound #2 Left,Lateral,Posterior Lower Leg o Silver Alginate Wound #3 Left,Medial,Anterior Lower Leg o Silver Alginate Secondary Dressing Wound #1 Left,Proximal,Medial Lower Leg o ABD pad o XtraSorb Wound #2 Left,Lateral,Posterior Lower Leg o ABD pad o XtraSorb Wound #3 Left,Medial,Anterior Lower Leg o ABD pad o XtraSorb Dressing Change Frequency Hoctor, Zohaib A. ([id number]) Wound #1 Left,Proximal,Medial Lower Leg o Change Dressing Monday, Wednesday, Friday - Wednesday in clinic. Wound #2 Left,Lateral,Posterior Lower Leg o Change Dressing Monday, Wednesday, Friday - Wednesday in clinic. Wound #3 Left,Medial,Anterior Lower Leg o Change Dressing Monday, Wednesday, Friday - Wednesday in clinic. Follow-up Appointments Wound #1 Left,Proximal,Medial Lower Leg o Return Appointment  in 1 week. Wound #2 Left,Lateral,Posterior Lower Leg o Return Appointment in 1 week. Wound #3 Left,Medial,Anterior Lower Leg o Return Appointment in 1 week. Edema Control Wound #1 Left,Proximal,Medial Lower Leg o 4 Layer Compression System - Bilateral Wound #2 Left,Lateral,Posterior Lower Leg o 4 Layer Compression System - Bilateral Wound #3 Left,Medial,Anterior Lower Leg o 4 Layer Compression System - Bilateral Home Health Wound #1 Left,Proximal,Medial Lower Leg o [place] Visits - Monday and Friday o Home Health Nurse may visit PRN to address patientos wound care needs. o FACE TO FACE ENCOUNTER: MEDICARE and MEDICAID PATIENTS: I certify that this patient is under my care and that I had a face-to-face encounter that meets the physician face-to-face encounter requirements with this patient on this date. The encounter with the patient was in whole or in part for the following MEDICAL CONDITION: (primary reason for [place]) MEDICAL NECESSITY: I certify, that based on my findings, NURSING services are a medically necessary home health service. HOME BOUND STATUS: I certify that my clinical findings support that this patient is homebound (i.e., Due to illness or injury, pt requires aid of supportive devices such as crutches, cane, wheelchairs, walkers, the use of special transportation or the assistance of another person to leave their place of residence. There is a normal inability to leave the home and doing so requires considerable and taxing effort. Other absences are for medical reasons / religious services and are infrequent or of short duration when for other reasons). o If current dressing causes regression in wound condition, may D/C ordered dressing product/s and apply Normal Saline Moist Dressing daily until next [place] / Other MD appointment. [place] of regression in wound condition at [phone]. Wound #2  [place] Visits - Monday and Friday o Home Health Nurse may visit PRN to address patientos wound care needs. o FACE TO FACE ENCOUNTER: MEDICARE and MEDICAID PATIENTS: I certify that this patient is under my care and that  I had a face-to-face encounter that meets the physician face-to-face encounter requirements with this patient on this date. The encounter with the patient was in whole or in part for the following MEDICAL CONDITION: (primary reason for [place]) MEDICAL NECESSITY: I certify, that based on my findings, NURSING services are a medically necessary home health service. HOME BOUND STATUS: I certify that my clinical findings support that this patient is homebound (i.e., Due to illness or injury, pt requires aid of supportive devices such as crutches, cane, wheelchairs, walkers, the use of special transportation or the [place], Eulalio A. ([id number]) assistance of another person to leave their place of residence. There is a normal inability to leave the home and doing so requires considerable and taxing effort. Other absences are for medical reasons / religious services and are infrequent or of short duration when for other reasons). o If current dressing causes regression in wound condition, may D/C ordered dressing product/s and apply Normal Saline Moist Dressing daily until next [place] / Other MD appointment. [place] of regression in wound condition at [phone]. Wound #3 Left,Medial,Anterior Lower Leg o [place] Visits - Monday and Friday o Home Health Nurse may visit PRN to address patientos wound care needs. o FACE TO FACE ENCOUNTER: MEDICARE and MEDICAID PATIENTS: I certify that this patient is under my care and that I had a face-to-face encounter that meets the physician face-to-face encounter requirements with this patient on this date. The encounter with the  patient was in whole or in part for the following MEDICAL CONDITION: (primary reason for [place]) MEDICAL NECESSITY: I certify, that based on my findings, NURSING services are a medically necessary home health service. HOME BOUND STATUS: I certify that my clinical findings support that this patient is homebound (i.e., Due to illness or injury, pt requires aid of supportive devices such as crutches, cane, wheelchairs, walkers, the use of special transportation or the assistance of another person to leave their place of residence. There is a normal inability to leave the home and doing so requires considerable and taxing effort. Other absences are for medical reasons / religious services and are infrequent or of short duration when for other reasons). o If current dressing causes regression in wound condition, may D/C ordered dressing product/s and apply Normal Saline Moist Dressing daily until next [place] / Other MD appointment. [place] of regression in wound condition at [phone]. Medications-please add to medication list. Wound #1 Left,Proximal,Medial Lower Leg o P.O. Antibiotics - Continue antibiotics Wound #2 Left,Lateral,Posterior Lower Leg o P.O. Antibiotics - Continue antibiotics Wound #3 Left,Medial,Anterior Lower Leg o P.O. Antibiotics - Continue antibiotics Consults o Vascular - 01/19/2018 Electronic Signature(s) Signed: 01/18/2018 5:10:30 PM By: Linton Ham MD Signed: 01/18/2018 5:19:09 PM By: Gretta Cool, BSN, RN, CWS, Kim RN, BSN Entered By: Gretta Cool, BSN, RN, CWS, Kim on 01/18/2018 10:31:12 [name], [name] Kitchen ([id number]) -------------------------------------------------------------------------------- Problem List Details Patient Name: Mccole, Juquan A. Date of Service: 01/18/2018 9:15 AM Medical Record Number: [id number] Patient Account Number: [id number] Date of Birth/Sex: 07-25-1971 (47 y.o. 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 ([id number]) -------------------------------------------------------------------------------- Progress Note Details Patient Name: [name], [name] Date of Service: 01/18/2018 9:15 AM Medical Record Number: [id number] Patient Account Number: [id number] Date of Birth/Sex: 1971/06/09 (47 y.o. M) Treating RN: Cornell Barman Primary Care Provider: Gaetano Net Other Clinician: Referring Provider: Gaetano Net Treating Provider/Extender: Tito Dine in Treatment: 3 Subjective History of Present Illness (HPI) 12/28/17; this is a now 47 year old man who is a type II diabetic. He was hospitalized from 10/01/17 through 10/19/17. He had an MSSA soft tissue and skin infection. 2 open areas on the left leg were identified he has a smaller area on the left medial calf superiorly just below the knee and a wound just above the left ankle on the posterior medial aspect. I think both of these were surgical IandD sites when he was in the hospital. He was discharged with a wound VAC at that point however this is since been taken off. He follows with Dr. Ola Spurr for the [facility] and he is still on chronic Keflex at 500 twice a day. At that time he was hospitalized his  hemoglobin A1c was 15.1 however if I'm reading his endocrinologist notes correctly that is improved. He has been following with Dr. [name] at [facility] and he has been applying calcium alginate and Unna boots. He has home health changing the dressing. They have also been attempting to get him external compression pumps although the patient is unaware whether they've been approved by insurance at this point. as mentioned he has a smaller clean wound on the right lateral calf just below the knee and he has a much larger area just above the left ankle medially and posteriorly. Our intake nurse reported greenish purulent looking drainage.the patient did have surgical material sent to pathology in February. This showed chronic abscess The patient also has lymphedema stage III in the left greater than right lower extremities. He has a history of blisters with wounds but these of all were always healed. The patient thinks that the lymphedema may have been present since he was about 47 years old i.e. about 30 years. He does not have graded pressure stockings and has not worn stockings. He does not have a distant history of DVT PE or phlebitis. He has not been systemically unwell fever no chills. He states that his Lasix is recently been reduced. He tells me his kidney function is at "30%" and he has been followed by Dr. [name] of nephrology. At one point he was on Lasix 80 twice a day however that's been cut back and he is now on Lasix at 20 twice a day. The patient has a history of PAD listed in his records although he comes from Dr. [name] I don't think is felt to have significant PAD. ABIs in our clinic were noncompressible bilaterally. 01/04/18; patient has a large wound on the left lateral lower calf and a small wound on the left medial upper calf. He has been to see his nephrologist who changed him to Demadex 40 mg a day. I'm hopeful this will help with his systemic fluid overload. He also  has stage III lymphedema. Really no change in the 2 wounds since last week 01/11/18; the patient is down 13 pounds. He put his stage III lymphedema left leg in  4K compression last week and there is less edema fluid however we still haven't been able to communicate with home health but [facility] but the dressings have not been changed. The patient noted an odor last week. He is also had compression pumps ordered by [facility] this as a not completed the paperwork stage. 01/18/18; patient continues to lose weight. Stage III lymphedema in the left greater than right leg under for alert compression. The major wound is on the left lateral ankle area. He apparently has bilateral compression pumps being brought to his house, these were ordered by [facility] Notable for the fact today he had some blisters on the right anterior leg together with some skin nodules. This is no doubt secondary to severe lymphedema. [name], [name] ([id number]) Objective Constitutional Patient is hypertensive.. Pulse regular and within target range for patient.[name] Respirations regular, non-labored and within target range.. Temperature is normal and within the target range for the patient.[name] appears in no distress. Vitals Time Taken: 9:44 AM, Height: 76 in, Weight: 364.4 lbs, BMI: 44.4, Temperature: 97.7 F, Pulse: 57 bpm, Respiratory Rate: 16 breaths/min, Blood Pressure: 150/87 mmHg. Eyes Conjunctivae clear. No discharge. Cardiovascular Pedal pulses palpable bilaterally. Edema present in both extremities. severe stage III lymphedema left greater than right.. Lymphatic none palpable in the popliteal or inguinal area. General Notes: when exam The upper medial calf wound was once again opened. Using a #5 curet and removed necrotic eschar and subcutaneous tissue. Hemostasis with direct pressure On the left posterior calf everything is closed over. The major area is on the left  lateral ankle. This is smaller. Surface of the wound looks satisfactory. No debridement was done. Integumentary (Hair, Skin) no primary skin issues are seen other than the lymphedema bilaterally in his lower legs. Wound #1 status is Open. Original cause of wound was Gradually Appeared. The wound is located on the Left,Proximal,Medial Lower Leg. The wound measures 1cm length x 1cm width x 0.1cm depth; 0.785cm^2 area and 0.079cm^3 volume. There is Fat Layer (Subcutaneous Tissue) Exposed exposed. There is no tunneling or undermining noted. There is a large amount of serosanguineous drainage noted. Foul odor after cleansing was noted. The wound margin is distinct with the outline attached to the wound base. There is large (67-100%) red granulation within the wound bed. There is a small (1-33%) amount of necrotic tissue within the wound bed including Adherent Slough. The periwound skin appearance exhibited: Dry/Scaly, Hemosiderin Staining. The periwound skin appearance did not exhibit: Callus, Crepitus, Excoriation, Induration, Rash, Scarring, Maceration, Atrophie Blanche, Cyanosis, Ecchymosis, Mottled, Pallor, Rubor, Erythema. Periwound temperature was noted as No Abnormality. Wound #2 status is Open. Original cause of wound was Gradually Appeared. The wound is located on the Left,Lateral,Posterior Lower Leg. The wound measures 4cm length x 7.7cm width x 0.7cm depth; 24.19cm^2 area and 16.933cm^3 volume. There is Fat Layer (Subcutaneous Tissue) Exposed exposed. There is no tunneling or undermining noted. There is a large amount of purulent drainage noted. Foul odor after cleansing was noted. The wound margin is distinct with the outline attached to the wound base. There is medium (34-66%) red, pink granulation within the wound bed. There is a medium (34-66%) amount of necrotic tissue within the wound bed including Eschar and Adherent Slough. The periwound skin appearance exhibited: Maceration. The  periwound skin appearance did not exhibit: Callus, Crepitus, Excoriation, Induration, Rash, Scarring, Dry/Scaly, Atrophie Blanche, Cyanosis, Ecchymosis, Hemosiderin Staining, Mottled, Pallor, Rubor, Erythema. Periwound temperature was noted as  No Abnormality. The periwound has tenderness on palpation. Wound #3 status is Open. Original cause of wound was Gradually Appeared. The wound is located on the Left,Medial,Anterior Lower Leg. The wound measures 0.5cm length x 1cm width x 0.1cm depth; 0.393cm^2 area and 0.039cm^3 volume. There is no tunneling or undermining noted. There is a none present amount of drainage noted. The wound margin is flat and intact. There is no granulation within the wound bed. There is a large (67-100%) amount of necrotic tissue within the wound bed including Eschar. The periwound skin appearance did not exhibit: Callus, Crepitus, Excoriation, Induration, Rash, Scarring, Dry/Scaly, Maceration, Atrophie Blanche, Cyanosis, Ecchymosis, Hemosiderin Staining, Mottled, Pallor, Rubor, Erythema. Periwound temperature was noted as No Abnormality. The periwound has tenderness on palpation. [name], [name] ([id number]) Assessment Active Problems ICD-10 [phone] - Non-pressure chronic ulcer of left calf with necrosis of muscle I89.0 - Lymphedema, not elsewhere classified I87.332 - Chronic venous hypertension (idiopathic) with ulcer and inflammation of left lower extremity E11.622 - Type 2 diabetes mellitus with other skin ulcer Procedures Wound #3 Pre-procedure diagnosis of Wound #3 is a Lymphedema located on the Left,Medial,Anterior Lower Leg . There was a Excisional Skin/Subcutaneous Tissue Debridement with a total area of 0.5 sq cm performed by Ricard Dillon, MD. With the following instrument(s): Curette to remove Viable and Non-Viable tissue/material. Material removed includes Subcutaneous Tissue and Slough and after achieving pain control using Other (lidocaine 4%). A time  out was conducted at 10:21, prior to the start of the procedure. A Minimum amount of bleeding was controlled with Pressure. The procedure was tolerated well with a pain level of 2 throughout and a pain level of 0 following the procedure. Patient s Level of Consciousness post procedure was recorded as Awake and Alert and post-procedure vitals were taken including Temperature: 97.7 F, Pulse: 57 bpm, Respiratory Rate: 16 breaths/min, Blood Pressure: (150)/(87) mmHg. Post Debridement Measurements: 0.5cm length x 1cm width x 0.2cm depth; 0.079cm^3 volume. Character of Wound/Ulcer Post Debridement requires further debridement. Post procedure Diagnosis Wound #3: Same as Pre-Procedure Plan Anesthetic (add to Medication List): Wound #1 Left,Proximal,Medial Lower Leg: Topical Lidocaine 4% cream applied to wound bed prior to debridement (In Clinic Only). Wound #2 Left,Lateral,Posterior Lower Leg: Topical Lidocaine 4% cream applied to wound bed prior to debridement (In Clinic Only). Wound #3 Left,Medial,Anterior Lower Leg: Topical Lidocaine 4% cream applied to wound bed prior to debridement (In Clinic Only). Skin Barriers/Peri-Wound Care: Wound #1 Left,Proximal,Medial Lower Leg: Barrier cream Wound #2 Left,Lateral,Posterior Lower Leg: Barrier cream Wound #3 Left,Medial,Anterior Lower Leg: Barrier cream Primary Wound Dressing: Wound #1 Left,Proximal,Medial Lower Leg: Silver Alginate Wound #2 Left,Lateral,Posterior Lower Leg: Silver Alginate Wound #3 Left,Medial,Anterior Lower Leg: Silver Alginate [name], [name] ([id number]) Secondary Dressing: Wound #1 Left,Proximal,Medial Lower Leg: ABD pad XtraSorb Wound #2 Left,Lateral,Posterior Lower Leg: ABD pad XtraSorb Wound #3 Left,Medial,Anterior Lower Leg: ABD pad XtraSorb Dressing Change Frequency: Wound #1 Left,Proximal,Medial Lower Leg: Change Dressing Monday, Wednesday, Friday - Wednesday in clinic. Wound #2 Left,Lateral,Posterior Lower  Leg: Change Dressing Monday, Wednesday, Friday - Wednesday in clinic. Wound #3 Left,Medial,Anterior Lower Leg: Change Dressing Monday, Wednesday, Friday - Wednesday in clinic. Follow-up Appointments: Wound #1 Left,Proximal,Medial Lower Leg: Return Appointment in 1 week. Wound #2 Left,Lateral,Posterior Lower Leg: Return Appointment in 1 week. Wound #3 Left,Medial,Anterior Lower Leg: Return Appointment in 1 week. Edema Control: Wound #1 Left,Proximal,Medial Lower Leg: 4 Layer Compression System - Bilateral Wound #2 Left,Lateral,Posterior Lower Leg: 4 Layer Compression System - Bilateral Wound #3 Left,Medial,Anterior Lower Leg:  4 Layer Compression System - Bilateral Home Health: Wound #1 Left,Proximal,Medial Lower Leg: Continue Home Health Visits - Monday and Friday Home Health Nurse may visit PRN to address patient s wound care needs. FACE TO FACE ENCOUNTER: MEDICARE and MEDICAID PATIENTS: I certify that this patient is under my care and that I had a face-to-face encounter that meets the physician face-to-face encounter requirements with this patient on this date. The encounter with the patient was in whole or in part for the following MEDICAL CONDITION: (primary reason for [place]) MEDICAL NECESSITY: I certify, that based on my findings, NURSING services are a medically necessary home health service. HOME BOUND STATUS: I certify that my clinical findings support that this patient is homebound (i.e., Due to illness or injury, pt requires aid of supportive devices such as crutches, cane, wheelchairs, walkers, the use of special transportation or the assistance of another person to leave their place of residence. There is a normal inability to leave the home and doing so requires considerable and taxing effort. Other absences are for medical reasons / religious services and are infrequent or of short duration when for other reasons). If current dressing causes regression in wound  condition, may D/C ordered dressing product/s and apply Normal Saline Moist Dressing daily until next [place] / Other MD appointment. [place] of regression in wound condition at [phone]. Wound #2 Left,Lateral,Posterior Lower Leg: Continue Home Health Visits - Monday and Friday Home Health Nurse may visit PRN to address patient s wound care needs. FACE TO FACE ENCOUNTER: MEDICARE and MEDICAID PATIENTS: I certify that this patient is under my care and that I had a face-to-face encounter that meets the physician face-to-face encounter requirements with this patient on this date. The encounter with the patient was in whole or in part for the following MEDICAL CONDITION: (primary reason for [place]) MEDICAL NECESSITY: I certify, that based on my findings, NURSING services are a medically necessary home health service. HOME BOUND STATUS: I certify that my clinical findings support that this patient is homebound (i.e., Due to illness or injury, pt requires aid of supportive devices such as crutches, cane, wheelchairs, walkers, the use of special transportation or the assistance of another person to leave their place of residence. There is a normal inability to leave the home and doing so requires considerable and taxing effort. Other absences are for medical reasons / religious services and are infrequent or of short duration when for other reasons). If current dressing causes regression in wound condition, may D/C ordered dressing product/s and apply Normal Saline [name], [name] ([id number]) Moist Dressing daily until next [place] / Other MD appointment. [place] of regression in wound condition at [phone]. Wound #3 Left,Medial,Anterior Lower Leg: Continue Home Health Visits - Monday and Friday Home Health Nurse may visit PRN to address patient s wound care needs. FACE TO FACE ENCOUNTER: MEDICARE and MEDICAID PATIENTS:  I certify that this patient is under my care and that I had a face-to-face encounter that meets the physician face-to-face encounter requirements with this patient on this date. The encounter with the patient was in whole or in part for the following MEDICAL CONDITION: (primary reason for [place]) MEDICAL NECESSITY: I certify, that based on my findings, NURSING services are a medically necessary home health service. HOME BOUND STATUS: I certify that my clinical findings support that this patient is homebound (i.e., Due to illness or injury, pt requires aid of supportive  devices such as crutches, cane, wheelchairs, walkers, the use of special transportation or the assistance of another person to leave their place of residence. There is a normal inability to leave the home and doing so requires considerable and taxing effort. Other absences are for medical reasons / religious services and are infrequent or of short duration when for other reasons). If current dressing causes regression in wound condition, may D/C ordered dressing product/s and apply Normal Saline Moist Dressing daily until next [place] / Other MD appointment. [place] of regression in wound condition at [phone]. Medications-please add to medication list.: Wound #1 Left,Proximal,Medial Lower Leg: P.O. Antibiotics - Continue antibiotics Wound #2 Left,Lateral,Posterior Lower Leg: P.O. Antibiotics - Continue antibiotics Wound #3 Left,Medial,Anterior Lower Leg: P.O. Antibiotics - Continue antibiotics Consults ordered were: Vascular - 01/19/2018 silver alginate to all wound areas We put both legs and compression today the right because of nodules and blisters anteriorly inferior that these might breakdown He has compression pumps coming to the house As a vein and vascular appointment tomorrow. Electronic Signature(s) Signed: 01/18/2018 5:10:30 PM By: Linton Ham MD Entered By:  Linton Ham on 01/18/2018 11:24:39 [place]ley, Rockne A. ([id number]) -------------------------------------------------------------------------------- SuperBill Details Patient Name: Deason, Caidence A. Date of Service: 01/18/2018 Medical Record Number: [id number] Patient Account Number: [id number] Date of Birth/Sex: 1971-03-25 (47 y.o. M) Treating RN: Cornell Barman Primary Care Provider: Gaetano Net Other Clinician: Referring Provider: Gaetano Net Treating Provider/Extender: Tito Dine in Treatment: 3 Diagnosis Coding ICD-10 Codes Code Description [phone] Non-pressure chronic ulcer of left calf with necrosis of muscle I89.0 Lymphedema, not elsewhere classified I87.332 Chronic venous hypertension (idiopathic) with ulcer and inflammation of left lower extremity E11.622 Type 2 diabetes mellitus with other skin ulcer Facility Procedures CPT4 Code: 35573220 Description: 25427 - DEB SUBQ TISSUE 20 SQ CM/< ICD-10 Diagnosis Description L97.223 Non-pressure chronic ulcer of left calf with necrosis of mu Modifier: scle Quantity: 1 Physician Procedures CPT4 Code: 0623762 Description: 83151 - WC PHYS SUBQ TISS 20 SQ CM ICD-10 Diagnosis Description L97.223 Non-pressure chronic ulcer of left calf with necrosis of mu Modifier: scle Quantity: 1 Electronic Signature(s) Signed: 01/18/2018 5:10:30 PM By: Linton Ham MD Entered By: Linton Ham on 01/18/2018 11:25:09

## 2018-01-20 NOTE — Progress Notes (Signed)
[name] [name] ([id number]) Visit Report for 01/18/2018 Arrival Information Details Patient Name: [name] Date of Service: 01/18/2018 9:15 AM Medical Record Number: [id number] Patient Account Number: [id number] Date of Birth/Sex: [birth year] (47 y.o. M) Treating RN: Roger Shelter Primary Care Miyah Hampshire: Gaetano Net Other Clinician: Referring Gailyn Crook: Gaetano Net Treating Diona Peregoy/Extender: Tito Dine in Treatment: 3 Visit Information History Since Last Visit All ordered tests and consults were completed: No Patient Arrived: Ambulatory Added or deleted any medications: No Arrival Time: 09:43 Any new allergies or adverse reactions: No Accompanied By: self Had [name] fall or experienced change in No Transfer Assistance: None activities of daily living that may affect Patient Identification Verified: Yes risk of falls: Secondary Verification Process Yes Signs or symptoms of abuse/neglect since last visito No Completed: Hospitalized since last visit: No Patient Requires Transmission-Based No Implantable device outside of the clinic excluding No Precautions: cellular tissue based products placed in the center Patient Has Alerts: Yes since last visit: Patient Alerts: DM II Pain Present Now: No noncompressible bilateral Electronic Signature(s) Signed: 01/18/2018 4:05:18 PM By: Roger Shelter Entered By: Roger Shelter on 01/18/2018 09:44:18 [name] ([id number]) -------------------------------------------------------------------------------- Encounter Discharge Information Details Patient Name: [name] Date of Service: 01/18/2018 9:15 AM Medical Record Number: [id number] Patient Account Number: [id number] Date of Birth/Sex: [birth year] (47 y.o. M) Treating RN: Montey Hora Primary Care Sophie Quiles: Gaetano Net Other Clinician: Referring Dinero Chavira: Gaetano Net Treating Cacey Willow/Extender: Tito Dine in Treatment: 3 Encounter  Discharge Information Items Discharge Condition: Stable Ambulatory Status: Ambulatory Discharge Destination: Home Transportation: Private Auto Accompanied By: self Schedule Follow-up Appointment: Yes Clinical Summary of Care: Electronic Signature(s) Signed: 01/18/2018 12:37:40 PM By: Montey Hora Entered By: Montey Hora on 01/18/2018 12:37:40 [name] ([id number]) -------------------------------------------------------------------------------- Lower Extremity Assessment Details Patient Name: [name] Date of Service: 01/18/2018 9:15 AM Medical Record Number: [id number] Patient Account Number: [id number] Date of Birth/Sex: Sep 11, 1970 (47 y.o. M) Treating RN: Roger Shelter Primary Care Arlan Birks: Gaetano Net Other Clinician: Referring Ely Ballen: Gaetano Net Treating Cally Nygard/Extender: Tito Dine in Treatment: 3 Edema Assessment Assessed: [Left: No] [Right: No] [Left: Edema] [Right: :] Calf Left: Right: Point of Measurement: 42 cm From Medial Instep 50 cm cm Ankle Left: Right: Point of Measurement: 12 cm From Medial Instep 45.5 cm cm Vascular Assessment Claudication: Claudication Assessment [Left:None] Pulses: Dorsalis Pedis Palpable: [Left:No] Doppler Audible: [Left:Yes] Posterior Tibial Extremity colors, hair growth, and conditions: Extremity Color: [Left:Hyperpigmented] Hair Growth on Extremity: [Left:Yes] Temperature of Extremity: [Left:Warm] Capillary Refill: [Left:< 3 seconds] Toe Nail Assessment Left: Right: Thick: Yes Discolored: Yes Deformed: No Improper Length and Hygiene: Yes Electronic Signature(s) Signed: 01/18/2018 4:05:18 PM By: Roger Shelter Entered By: Roger Shelter on 01/18/2018 10:00:03 [name] ([id number]) -------------------------------------------------------------------------------- Multi Wound Chart Details Patient Name: [name] Date of Service: 01/18/2018 9:15 AM Medical Record Number:  [id number] Patient Account Number: [id number] Date of Birth/Sex: November 20, 1970 (47 y.o. M) Treating RN: Cornell Barman Primary Care Najat Olazabal: Gaetano Net Other Clinician: Referring Cypress Fanfan: Gaetano Net Treating Raysa Bosak/Extender: Tito Dine in Treatment: 3 Vital Signs Height(in): 76 Pulse(bpm): 22 Weight(lbs): 364.4 Blood Pressure(mmHg): 150/87 Body Mass Index(BMI): 44 Temperature(F): 97.7 Respiratory Rate 16 (breaths/min): Photos: [1:No Photos] [2:No Photos] [3:No Photos] Wound Location: [1:Left Lower Leg - Medial, Proximal] [2:Left Lower Leg - Lateral, Posterior] [3:Left Lower Leg - Medial, Anterior] Wounding Event: [1:Gradually Appeared] [2:Gradually Appeared] [3:Gradually Appeared] Primary Etiology: [1:Diabetic Wound/Ulcer of the Lower Extremity] [2:Diabetic Wound/Ulcer of the Lower Extremity] [3:Lymphedema] Secondary Etiology: [  1:Lymphedema] [2:Lymphedema] [3:N/[name]] Comorbid History: [1:Hypertension, Type II Diabetes, Neuropathy] [2:Hypertension, Type II Diabetes, Neuropathy] [3:Hypertension, Type II Diabetes, Neuropathy] Date Acquired: [1:09/30/2017] [2:09/30/2017] [3:01/11/2018] Weeks of Treatment: [1:3] [2:3] [3:1] Wound Status: [1:Open] [2:Open] [3:Open] Measurements L x W x D [1:1x1x0.1] [2:4x7.7x0.7] [3:0.5x1x0.1] (cm) Area (cm) : [1:0.785] [2:24.19] [3:0.393] Volume (cm) : [1:0.079] [2:16.933] [3:0.039] % Reduction in Area: [1:80.50%] [2:14.40%] [3:16.60%] % Reduction in Volume: [1:96.70%] [2:-49.70%] [3:17.00%] Classification: [1:Grade 2] [2:Grade 2] [3:Partial Thickness] Exudate Amount: [1:Large] [2:Large] [3:None Present] Exudate Type: [1:Serosanguineous] [2:Purulent] [3:N/[name]] Exudate Color: [1:red, brown] [2:yellow, brown, green] [3:N/[name]] Foul Odor After Cleansing: [1:Yes] [2:Yes] [3:No] Odor Anticipated Due to [1:No] [2:No] [3:N/[name]] Product Use: Wound Margin: [1:Distinct, outline attached] [2:Distinct, outline attached] [3:Flat and Intact] Granulation  Amount: [1:Large (67-100%)] [2:Medium (34-66%)] [3:None Present (0%)] Granulation Quality: [1:Red] [2:Red, Pink] [3:N/[name]] Necrotic Amount: [1:Small (1-33%)] [2:Medium (34-66%)] [3:Large (67-100%)] Necrotic Tissue: [1:Adherent Slough] [2:Eschar, Adherent Slough] [3:Eschar] Exposed Structures: [1:Fat Layer (Subcutaneous Tissue) Exposed: Yes Fascia: No Tendon: No Muscle: No Joint: No Bone: No] [2:Fat Layer (Subcutaneous Tissue) Exposed: Yes Fascia: No Tendon: No Muscle: No Joint: No Bone: No] [3:Fascia: No Fat Layer (Subcutaneous Tissue)  Exposed: No Tendon: No Muscle: No Joint: No Bone: No] Epithelialization: None None Large (67-100%) Debridement: N/[name] N/[name] Debridement - Excisional Pre-procedure N/[name] N/[name] 10:21 Verification/Time Out Taken: Pain Control: N/[name] N/[name] Other Tissue Debrided: N/[name] N/[name] Subcutaneous, Slough Level: N/[name] N/[name] Skin/Subcutaneous Tissue Debridement Area (sq cm): N/[name] N/[name] 0.5 Instrument: N/[name] N/[name] Curette Bleeding: N/[name] N/[name] Minimum Hemostasis Achieved: N/[name] N/[name] Pressure Procedural Pain: N/[name] N/[name] 2 Post Procedural Pain: N/[name] N/[name] 0 Debridement Treatment N/[name] N/[name] Procedure was tolerated well Response: Post Debridement N/[name] N/[name] 0.5x1x0.2 Measurements L x W x D (cm) Post Debridement Volume: N/[name] N/[name] 0.079 (cm) Periwound Skin Texture: Excoriation: No Excoriation: No Excoriation: No Induration: No Induration: No Induration: No Callus: No Callus: No Callus: No Crepitus: No Crepitus: No Crepitus: No Rash: No Rash: No Rash: No Scarring: No Scarring: No Scarring: No Periwound Skin Moisture: Dry/Scaly: Yes Maceration: Yes Maceration: No Maceration: No Dry/Scaly: No Dry/Scaly: No Periwound Skin Color: Hemosiderin Staining: Yes Atrophie Blanche: No Atrophie Blanche: No Atrophie Blanche: No Cyanosis: No Cyanosis: No Cyanosis: No Ecchymosis: No Ecchymosis: No Ecchymosis: No Erythema: No Erythema: No Erythema: No Hemosiderin Staining: No Hemosiderin Staining:  No Mottled: No Mottled: No Mottled: No Pallor: No Pallor: No Pallor: No Rubor: No Rubor: No Rubor: No Temperature: No Abnormality No Abnormality No Abnormality Tenderness on Palpation: No Yes Yes Wound Preparation: Ulcer Cleansing: Ulcer Cleansing: Ulcer Cleansing: Rinsed/Irrigated with Saline, Rinsed/Irrigated with Saline, Rinsed/Irrigated with Saline Other: soap and water Other: soap and water Topical Anesthetic Applied: Topical Anesthetic Applied: Topical Anesthetic Applied: None, Other: lidocaine 4% Other: lidocaine 4% Other: lidocaine 4% Procedures Performed: N/[name] N/[name] Debridement Treatment Notes Electronic Signature(s) Signed: 01/18/2018 5:10:30 PM By: Linton Ham MD Entered By: Linton Ham on 01/18/2018 10:54:35 Knobloch, Churchill AMarland Kitchen ([id number]) -------------------------------------------------------------------------------- Multi-Disciplinary Care Plan Details Patient Name: [name] Date of Service: 01/18/2018 9:15 AM Medical Record Number: [id number] Patient Account Number: [id number] Date of Birth/Sex: June 30, 1971 (47 y.o. M) Treating RN: Cornell Barman Primary Care Noam Karaffa: Gaetano Net Other Clinician: Referring Tegh Franek: Gaetano Net Treating Laray Rivkin/Extender: Tito Dine in Treatment: 3 Active Inactive ` Abuse / Safety / Falls / Self Care Management Nursing Diagnoses: Potential for falls Goals: Patient will remain injury free related to falls Date Initiated: 12/28/2017 Target Resolution Date: 04/08/2018 Goal Status: Active Interventions: Assess fall risk on admission and as needed Notes: `  Nutrition Nursing Diagnoses: Impaired glucose control: actual or potential Goals: Patient/caregiver verbalizes understanding of need to maintain therapeutic glucose control per primary care physician Date Initiated: 12/28/2017 Target Resolution Date: 04/08/2018 Goal Status: Active Interventions: Provide education on elevated blood sugars and  impact on wound healing Notes: ` Orientation to the Wound Care Program Nursing Diagnoses: Knowledge deficit related to the wound healing center program Goals: Patient/caregiver will verbalize understanding of the [facility] Program Date Initiated: 12/28/2017 Target Resolution Date: 04/08/2018 Goal Status: Active Interventions: Glomski, Therron [name] ([id number]) Provide education on orientation to the wound center Notes: ` Wound/Skin Impairment Nursing Diagnoses: Impaired tissue integrity Goals: Ulcer/skin breakdown will heal within 14 weeks Date Initiated: 12/28/2017 Target Resolution Date: 04/08/2018 Goal Status: Active Interventions: Assess patient/caregiver ability to obtain necessary supplies Assess patient/caregiver ability to perform ulcer/skin care regimen upon admission and as needed Assess ulceration(s) every visit Notes: Electronic Signature(s) Signed: 01/18/2018 5:19:09 PM By: Gretta Cool, BSN, RN, CWS, Kim RN, BSN Entered By: Gretta Cool, BSN, RN, CWS, Kim on 01/18/2018 10:20:20 Mallinger, Hanish [name] ([id number]) -------------------------------------------------------------------------------- Non-Wound Condition Assessment Details Patient Name: Wuest, Eduin [name] Date of Service: 01/18/2018 9:15 AM Medical Record Number: [id number] Patient Account Number: [id number] Date of Birth/Sex: [birth year] (47 y.o. M) Treating RN: Cornell Barman Primary Care Deandrew Hoecker: Gaetano Net Other Clinician: Referring Lashunta Frieden: Gaetano Net Treating Kerina Simoneau/Extender: Tito Dine in Treatment: 3 Non-Wound Condition: Condition: Lymphedema Location: Leg Side: Right Periwound Skin Texture Texture Color No Abnormalities Noted: No No Abnormalities Noted: No Callus: No Atrophie Blanche: No Crepitus: No Cyanosis: No Excoriation: No Ecchymosis: No Friable: No Erythema: No Induration: Yes Hemosiderin Staining: Yes Rash: No Mottled: No Scarring: No Pallor: No Rubor: No Moisture No  Abnormalities Noted: No Dry / Scaly: No Maceration: No Electronic Signature(s) Signed: 01/18/2018 5:19:09 PM By: Gretta Cool, BSN, RN, CWS, Kim RN, BSN Entered By: Gretta Cool, BSN, RN, CWS, Kim on 01/18/2018 10:29:49 Slutsky, Bacilio [name] ([id number]) -------------------------------------------------------------------------------- Pain Assessment Details Patient Name: [name] Date of Service: 01/18/2018 9:15 AM Medical Record Number: [id number] Patient Account Number: [id number] Date of Birth/Sex: October 01, 1970 (47 y.o. M) Treating RN: Roger Shelter Primary Care Yolanda Dockendorf: Gaetano Net Other Clinician: Referring Havah Ammon: Gaetano Net Treating Bearl Talarico/Extender: Tito Dine in Treatment: 3 Active Problems Location of Pain Severity and Description of Pain Patient Has Paino No Site Locations Pain Management and Medication Current Pain Management: Electronic Signature(s) Signed: 01/18/2018 4:05:18 PM By: Roger Shelter Entered By: Roger Shelter on 01/18/2018 09:44:26 Cartier, [name] ([id number]) -------------------------------------------------------------------------------- Patient/Caregiver Education Details Patient Name: Goates, Rydan [name] Date of Service: 01/18/2018 9:15 AM Medical Record Number: [id number] Patient Account Number: [id number] Date of Birth/Gender: 03-15-1971 (48 y.o. M) Treating RN: Montey Hora Primary Care Physician: Gaetano Net Other Clinician: Referring Physician: Gaetano Net Treating Physician/Extender: Tito Dine in Treatment: 3 Education Assessment Education Provided To: Patient Education Topics Provided Venous: Handouts: Other: need for compression Methods: Explain/Verbal Responses: State content correctly Electronic Signature(s) Signed: 01/18/2018 4:55:05 PM By: Montey Hora Entered By: Montey Hora on 01/18/2018 12:38:01 Widjaja, Dom [name]  ([id number]) -------------------------------------------------------------------------------- Wound Assessment Details Patient Name: [name], Kalep [name] Date of Service: 01/18/2018 9:15 AM Medical Record Number: [id number] Patient Account Number: [id number] Date of Birth/Sex: November 27, 1970 (47 y.o. M) Treating RN: Roger Shelter Primary Care Cesily Cuoco: Gaetano Net Other Clinician: Referring Pahoua Schreiner: Gaetano Net Treating Charistopher Rumble/Extender: Tito Dine in Treatment: 3 Wound Status Wound Number: 1 Primary Etiology: Diabetic Wound/Ulcer of the Lower Extremity Wound Location: Left Lower Leg - Medial, Proximal Secondary Lymphedema Wounding  Event: Gradually Appeared Etiology: Date Acquired: 09/30/2017 Wound Status: Open Weeks Of Treatment: 3 Comorbid History: Hypertension, Type II Diabetes, Clustered Wound: No Neuropathy Photos Photo Uploaded By: Roger Shelter on 01/18/2018 16:19:06 Wound Measurements Length: (cm) 1 Width: (cm) 1 Depth: (cm) 0.1 Area: (cm) 0.785 Volume: (cm) 0.079 % Reduction in Area: 80.5% % Reduction in Volume: 96.7% Epithelialization: None Tunneling: No Undermining: No Wound Description Classification: Grade 2 Wound Margin: Distinct, outline attached Exudate Amount: Large Exudate Type: Serosanguineous Exudate Color: red, brown Foul Odor After Cleansing: Yes Due to Product Use: No Slough/Fibrino Yes Wound Bed Granulation Amount: Large (67-100%) Exposed Structure Granulation Quality: Red Fascia Exposed: No Necrotic Amount: Small (1-33%) Fat Layer (Subcutaneous Tissue) Exposed: Yes Necrotic Quality: Adherent Slough Tendon Exposed: No Muscle Exposed: No Joint Exposed: No Bone Exposed: No Periwound Skin Texture Kalan, Gurnoor [name] ([id number]) Texture Color No Abnormalities Noted: No No Abnormalities Noted: No Callus: No Atrophie Blanche: No Crepitus: No Cyanosis: No Excoriation: No Ecchymosis: No Induration: No Erythema: No Rash:  No Hemosiderin Staining: Yes Scarring: No Mottled: No Pallor: No Moisture Rubor: No No Abnormalities Noted: No Dry / Scaly: Yes Temperature / Pain Maceration: No Temperature: No Abnormality Wound Preparation Ulcer Cleansing: Rinsed/Irrigated with Saline, Other: soap and water, Topical Anesthetic Applied: Other: lidocaine 4%, Treatment Notes Wound #1 (Left, Proximal, Medial Lower Leg) 1. Cleansed with: Clean wound with Normal Saline Cleanse wound with antibacterial soap and water 2. Anesthetic Topical Lidocaine 4% cream to wound bed prior to debridement 3. Peri-wound Care: Moisturizing lotion 4. Dressing Applied: Other dressing (specify in notes) 5. Secondary Dressing Applied ABD Pad 7. Secured with 4-Layer Compression System - Left Lower Extremity Notes silvercell, caramax Electronic Signature(s) Signed: 01/18/2018 4:05:18 PM By: Roger Shelter Entered By: Roger Shelter on 01/18/2018 10:02:58 Breeding, Jeron [name] ([id number]) -------------------------------------------------------------------------------- Wound Assessment Details Patient Name: [name] Date of Service: 01/18/2018 9:15 AM Medical Record Number: [id number] Patient Account Number: [id number] Date of Birth/Sex: 11/03/1970 (47 y.o. M) Treating RN: Roger Shelter Primary Care Aranda Bihm: Gaetano Net Other Clinician: Referring Daiki Dicostanzo: Gaetano Net Treating Sandrea Boer/Extender: Tito Dine in Treatment: 3 Wound Status Wound Number: 2 Primary Etiology: Diabetic Wound/Ulcer of the Lower Extremity Wound Location: Left Lower Leg - Lateral, Posterior Secondary Lymphedema Wounding Event: Gradually Appeared Etiology: Date Acquired: 09/30/2017 Wound Status: Open Weeks Of Treatment: 3 Comorbid History: Hypertension, Type II Diabetes, Clustered Wound: No Neuropathy Photos Photo Uploaded By: Roger Shelter on 01/18/2018 16:20:11 Wound Measurements Length: (cm) 4 Width: (cm) 7.7 Depth:  (cm) 0.7 Area: (cm) 24.19 Volume: (cm) 16.933 % Reduction in Area: 14.4% % Reduction in Volume: -49.7% Epithelialization: None Tunneling: No Undermining: No Wound Description Classification: Grade 2 Wound Margin: Distinct, outline attached Exudate Amount: Large Exudate Type: Purulent Exudate Color: yellow, brown, green Foul Odor After Cleansing: Yes Due to Product Use: No Slough/Fibrino Yes Wound Bed Granulation Amount: Medium (34-66%) Exposed Structure Granulation Quality: Red, Pink Fascia Exposed: No Necrotic Amount: Medium (34-66%) Fat Layer (Subcutaneous Tissue) Exposed: Yes Necrotic Quality: Eschar, Adherent Slough Tendon Exposed: No Muscle Exposed: No Joint Exposed: No Bone Exposed: No Periwound Skin Texture Bertoli, Rhoderick [name] ([id number]) Texture Color No Abnormalities Noted: No No Abnormalities Noted: No Callus: No Atrophie Blanche: No Crepitus: No Cyanosis: No Excoriation: No Ecchymosis: No Induration: No Erythema: No Rash: No Hemosiderin Staining: No Scarring: No Mottled: No Pallor: No Moisture Rubor: No No Abnormalities Noted: No Dry / Scaly: No Temperature / Pain Maceration: Yes Temperature: No Abnormality Tenderness on Palpation: Yes Wound Preparation Ulcer Cleansing:  Rinsed/Irrigated with Saline, Other: soap and water, Topical Anesthetic Applied: Other: lidocaine 4%, Treatment Notes Wound #2 (Left, Lateral, Posterior Lower Leg) 1. Cleansed with: Clean wound with Normal Saline Cleanse wound with antibacterial soap and water 2. Anesthetic Topical Lidocaine 4% cream to wound bed prior to debridement 3. Peri-wound Care: Moisturizing lotion 4. Dressing Applied: Other dressing (specify in notes) 5. Secondary Dressing Applied ABD Pad 7. Secured with 4-Layer Compression System - Left Lower Extremity Notes silvercell, caramax Electronic Signature(s) Signed: 01/18/2018 4:05:18 PM By: Roger Shelter Entered By: Roger Shelter on  01/18/2018 10:03:16 [name] ([id number]) -------------------------------------------------------------------------------- Wound Assessment Details Patient Name: [name] Date of Service: 01/18/2018 9:15 AM Medical Record Number: [id number] Patient Account Number: [id number] Date of Birth/Sex: 03-18-1971 (47 y.o. M) Treating RN: Roger Shelter Primary Care Shivali Quackenbush: Gaetano Net Other Clinician: Referring [name] Tomer: Gaetano Net Treating Emmely Bittinger/Extender: Tito Dine in Treatment: 3 Wound Status Wound Number: 3 Primary Etiology: Lymphedema Wound Location: Left Lower Leg - Medial, Anterior Wound Status: Open Wounding Event: Gradually Appeared Comorbid Hypertension, Type II Diabetes, History: Neuropathy Date Acquired: 01/11/2018 Weeks Of Treatment: 1 Clustered Wound: No Photos Photo Uploaded By: Roger Shelter on 01/18/2018 16:20:12 Wound Measurements Length: (cm) 0.5 Width: (cm) 1 Depth: (cm) 0.1 Area: (cm) 0.393 Volume: (cm) 0.039 % Reduction in Area: 16.6% % Reduction in Volume: 17% Epithelialization: Large (67-100%) Tunneling: No Undermining: No Wound Description Classification: Partial Thickness Wound Margin: Flat and Intact Exudate Amount: None Present Foul Odor After Cleansing: No Slough/Fibrino Yes Wound Bed Granulation Amount: None Present (0%) Exposed Structure Necrotic Amount: Large (67-100%) Fascia Exposed: No Necrotic Quality: Eschar Fat Layer (Subcutaneous Tissue) Exposed: No Tendon Exposed: No Muscle Exposed: No Joint Exposed: No Bone Exposed: No Periwound Skin Texture Texture Color No Abnormalities Noted: No No Abnormalities Noted: No [name] ([id number]) Callus: No Atrophie Blanche: No Crepitus: No Cyanosis: No Excoriation: No Ecchymosis: No Induration: No Erythema: No Rash: No Hemosiderin Staining: No Scarring: No Mottled: No Pallor: No Moisture Rubor: No No Abnormalities Noted: No Dry /  Scaly: No Temperature / Pain Maceration: No Temperature: No Abnormality Tenderness on Palpation: Yes Wound Preparation Ulcer Cleansing: Rinsed/Irrigated with Saline Topical Anesthetic Applied: None, Other: lidocaine 4%, Treatment Notes Wound #3 (Left, Medial, Anterior Lower Leg) 1. Cleansed with: Clean wound with Normal Saline Cleanse wound with antibacterial soap and water 2. Anesthetic Topical Lidocaine 4% cream to wound bed prior to debridement 3. Peri-wound Care: Moisturizing lotion 4. Dressing Applied: Other dressing (specify in notes) 5. Secondary Dressing Applied ABD Pad 7. Secured with 4-Layer Compression System - Left Lower Extremity Notes silvercell, caramax Electronic Signature(s) Signed: 01/18/2018 4:05:18 PM By: Roger Shelter Entered By: Roger Shelter on 01/18/2018 10:03:45 Mcfall, [name] ([id number]) -------------------------------------------------------------------------------- Vitals Details Patient Name: [name] Date of Service: 01/18/2018 9:15 AM Medical Record Number: [id number] Patient Account Number: [id number] Date of Birth/Sex: 1970-12-06 (47 y.o. M) Treating RN: Roger Shelter Primary Care Geo Slone: Gaetano Net Other Clinician: Referring Darcus Edds: Gaetano Net Treating Serina Nichter/Extender: Tito Dine in Treatment: 3 Vital Signs Time Taken: 09:44 Temperature (F): 97.7 Height (in): 76 Pulse (bpm): 57 Weight (lbs): 364.4 Respiratory Rate (breaths/min): 16 Body Mass Index (BMI): 44.4 Blood Pressure (mmHg): 150/87 Reference Range: 80 - 120 mg / dl Electronic Signature(s) Signed: 01/18/2018 4:05:18 PM By: Roger Shelter Entered By: Roger Shelter on 01/18/2018 09:45:48

## 2018-01-23 ENCOUNTER — Encounter (INDEPENDENT_AMBULATORY_CARE_PROVIDER_SITE_OTHER): Payer: Self-pay | Admitting: Vascular Surgery

## 2018-01-25 ENCOUNTER — Encounter: Payer: BLUE CROSS/BLUE SHIELD | Admitting: Internal Medicine

## 2018-01-25 DIAGNOSIS — E11622 Type 2 diabetes mellitus with other skin ulcer: Secondary | ICD-10-CM | POA: Diagnosis not present

## 2018-01-25 NOTE — Progress Notes (Signed)
[name], [name] ([id number]) Visit Report for 01/04/2018 Arrival Information Details Patient Name: [name], [name] Date of Service: 01/04/2018 8:45 AM Medical Record Number: [id number] Patient Account Number: [id number] Date of Birth/Sex: 05-23-1971 (47 y.o. M) Treating RN: [name] Primary Care [name]: [name] Other Clinician: Referring [name]: [name] Treating [name]/Extender: [name] in Treatment: 1 Visit Information History Since Last Visit Added or deleted any medications: No Patient Arrived: Ambulatory Any new allergies or adverse reactions: No Arrival Time: 08:46 Had [name] fall or experienced change in No Accompanied By: self activities of daily living that may affect Transfer Assistance: None risk of falls: Patient Requires Transmission-Based No Signs or symptoms of abuse/neglect since last visito No Precautions: Hospitalized since last visit: No Patient Has Alerts: Yes Implantable device outside of the clinic excluding No Patient Alerts: DM II cellular tissue based products placed in the center noncompressible since last visit: bilateral Pain Present Now: No Electronic Signature(s) Signed: 01/25/2018 7:45:54 AM By: [name] Entered By: [name] on 01/04/2018 08:49:26 [name], [name] ([id number]) -------------------------------------------------------------------------------- Compression Therapy Details Patient Name: [name], [name] Date of Service: 01/04/2018 8:45 AM Medical Record Number: [id number] Patient Account Number: [id number] Date of Birth/Sex: [birth year] (47 y.o. M) Treating RN: [name] Primary Care Keyen Marban: [name] Other Clinician: Referring Lashanti Chambless: [name] Treating [name]/Extender: [name] in Treatment: 1 Compression Therapy Performed for Wound Assessment: Wound #1 Left,Proximal,Medial Lower Leg Performed By: Clinician [name] Barman, RN Compression Type: Four Layer Post Procedure  Diagnosis Same as Pre-procedure Electronic Signature(s) Signed: 01/04/2018 5:47:31 PM By: [name], BSN, RN, CWS, Kim RN, BSN Entered By: [name] on 01/04/2018 09:34:39 [name], [name] ([id number]) -------------------------------------------------------------------------------- Encounter Discharge Information Details Patient Name: [name], [name] Date of Service: 01/04/2018 8:45 AM Medical Record Number: [id number] Patient Account Number: [id number] Date of Birth/Sex: 1971/02/24 (47 y.o. M) Treating RN: [name] Primary Care [name]: [name] Other Clinician: Referring [name]: [name] Treating [name]/Extender: [name] in Treatment: 1 Encounter Discharge Information Items Discharge Condition: Stable Ambulatory Status: Ambulatory Discharge Destination: Home Transportation: Private Auto Accompanied By: self Schedule Follow-up Appointment: Yes Clinical Summary of Care: Electronic Signature(s) Signed: 01/25/2018 7:45:54 AM By: [name] Entered By: [name] on 01/04/2018 10:03:07 [name], [name] ([id number]) -------------------------------------------------------------------------------- Lower Extremity Assessment Details Patient Name: [name], [name] Date of Service: 01/04/2018 8:45 AM Medical Record Number: [id number] Patient Account Number: [id number] Date of Birth/Sex: 08-18-1971 (47 y.o. M) Treating RN: [name] Primary Care Norfleet Capers: [name] Other Clinician: Referring [name]: [name] Treating [name]/Extender: [name] in Treatment: 1 Edema Assessment Assessed: [Left: No] [Right: No] Edema: [Left: Ye] [Right: s] Calf Left: Right: Point of Measurement: 42 cm From Medial Instep 61.5 cm cm Ankle Left: Right: Point of Measurement: 12 cm From Medial Instep 48.6 cm cm Vascular Assessment Pulses: Dorsalis Pedis Palpable: [Left:Yes] [Right:Yes] Posterior Tibial Palpable: [Left:No]  [Right:No] Extremity colors, hair growth, and conditions: Hair Growth on Extremity: [Left:Yes] [Right:Yes] Temperature of Extremity: [Left:Warm] [Right:Warm] Capillary Refill: [Left:< 3 seconds] [Right:< 3 seconds] Dependent Rubor: [Left:No] [Right:No] Blanched when Elevated: [Left:No] [Right:No] Lipodermatosclerosis: [Left:No] [Right:No] Electronic Signature(s) Signed: 01/04/2018 5:47:31 PM By: [name], BSN, RN, CWS, Kim RN, BSN Signed: 01/25/2018 7:45:54 AM By: [name] Entered By: [name] on 01/04/2018 09:13:25 [name], [name] ([id number]) -------------------------------------------------------------------------------- Multi Wound Chart Details Patient Name: [name], [name] Date of Service: 01/04/2018 8:45 AM Medical Record Number: [id number] Patient Account Number: [id number] Date of Birth/Sex: November 09, 1970 (  47 y.o. M) Treating RN: [name] Primary Care [name]: [name] Other Clinician: Referring [name]: [name] Treating [name]/Extender: [name] in Treatment: 1 Vital Signs Height(in): 76 Pulse(bpm): 65 Weight(lbs): 377.6 Blood Pressure(mmHg): 166/74 Body Mass Index(BMI): 46 Temperature(F): 98.1 Respiratory Rate 16 (breaths/min): Photos: [1:No Photos] [2:No Photos] [N/[name]:N/[name]] Wound Location: [1:Left Lower Leg - Medial, Proximal] [2:Left Lower Leg - Lateral] [N/[name]:N/[name]] Wounding Event: [1:Gradually Appeared] [2:Gradually Appeared] [N/[name]:N/[name]] Primary Etiology: [1:Diabetic Wound/Ulcer of the Lower Extremity] [2:Diabetic Wound/Ulcer of the Lower Extremity] [N/[name]:N/[name]] Secondary Etiology: [1:Lymphedema] [2:Lymphedema] [N/[name]:N/[name]] Comorbid History: [1:Hypertension, Type II Diabetes, Neuropathy] [2:Hypertension, Type II Diabetes, Neuropathy] [N/[name]:N/[name]] Date Acquired: [1:09/30/2017] [2:09/30/2017] [N/[name]:N/[name]] Weeks of Treatment: [1:1] [2:1] [N/[name]:N/[name]] Wound Status: [1:Open] [2:Open] [N/[name]:N/[name]] Measurements L x W x D [1:1.5x3x0.5] [2:4.2x7.6x0.9]  [N/[name]:N/[name]] (cm) Area (cm) : [1:3.534] [2:25.07] [N/[name]:N/[name]] Volume (cm) : [1:1.767] [2:22.563] [N/[name]:N/[name]] % Reduction in Area: [1:12.10%] [2:11.30%] [N/[name]:N/[name]] % Reduction in Volume: [1:26.80%] [2:-99.50%] [N/[name]:N/[name]] Classification: [1:Grade 2] [2:Grade 2] [N/[name]:N/[name]] Exudate Amount: [1:Large] [2:Large] [N/[name]:N/[name]] Exudate Type: [1:Serosanguineous] [2:Purulent] [N/[name]:N/[name]] Exudate Color: [1:red, brown] [2:yellow, brown, green] [N/[name]:N/[name]] Foul Odor After Cleansing: [1:No] [2:Yes] [N/[name]:N/[name]] Odor Anticipated Due to [1:N/[name]] [2:No] [N/[name]:N/[name]] Product Use: Wound Margin: [1:Distinct, outline attached] [2:Distinct, outline attached] [N/[name]:N/[name]] Granulation Amount: [1:Medium (34-66%)] [2:Medium (34-66%)] [N/[name]:N/[name]] Granulation Quality: [1:N/[name]] [2:Red, Pink] [N/[name]:N/[name]] Necrotic Amount: [1:Small (1-33%)] [2:Medium (34-66%)] [N/[name]:N/[name]] Necrotic Tissue: [1:N/[name]] [2:Eschar, Adherent Slough] [N/[name]:N/[name]] Exposed Structures: [1:Fat Layer (Subcutaneous Tissue) Exposed: Yes Fascia: No Tendon: No Muscle: No Joint: No Bone: No] [2:Fat Layer (Subcutaneous Tissue) Exposed: Yes Fascia: No Tendon: No Muscle: No Joint: No Bone: No] [N/[name]:N/[name]] Epithelialization: None None N/[name] Periwound Skin Texture: Excoriation: No Excoriation: No N/[name] Induration: No Induration: No Callus: No Callus: No Crepitus: No Crepitus: No Rash: No Rash: No Scarring: No Scarring: No Periwound Skin Moisture: Dry/Scaly: Yes Maceration: Yes N/[name] Maceration: No Dry/Scaly: No Periwound Skin Color: Hemosiderin Staining: Yes Atrophie Blanche: No N/[name] Atrophie Blanche: No Cyanosis: No Cyanosis: No Ecchymosis: No Ecchymosis: No Erythema: No Erythema: No Hemosiderin Staining: No Mottled: No Mottled: No Pallor: No Pallor: No Rubor: No Rubor: No Temperature: No Abnormality No Abnormality N/[name] Tenderness on Palpation: No Yes N/[name] Wound Preparation: Ulcer Cleansing: Ulcer Cleansing: N/[name] Rinsed/Irrigated with Saline, Rinsed/Irrigated with  Saline, Other: soap and water Other: soap and water Topical Anesthetic Applied: Topical Anesthetic Applied: Other: lidocaine 4% Other: lidocaine 4% Procedures Performed: Compression Therapy N/[name] N/[name] Treatment Notes Wound #1 (Left, Proximal, Medial Lower Leg) 1. Cleansed with: Clean wound with Normal Saline 2. Anesthetic Topical Lidocaine 4% cream to wound bed prior to debridement 3. Peri-wound Care: Moisturizing lotion 4. Dressing Applied: Other dressing (specify in notes) 5. Secondary Dressing Applied ABD Pad 7. Secured with 4-Layer Compression System - Left Lower Extremity Notes silvercell, xtrasorb Wound #2 (Left, Lateral Lower Leg) 1. Cleansed with: Clean wound with Normal Saline 2. Anesthetic Topical Lidocaine 4% cream to wound bed prior to debridement 3. Peri-wound Care: Moisturizing lotion 4. Dressing Applied: Other dressing (specify in notes) 5. Secondary Dressing Applied [name], [name] ([id number]) ABD Pad 7. Secured with 4-Layer Compression System - Left Lower Extremity Notes silvercell, xtrasorb Electronic Signature(s) Signed: 01/05/2018 1:16:01 PM By: Linton Ham MD Entered By: Linton Ham on 01/04/2018 10:20:17 Dobrowolski, [name] ([id number]) -------------------------------------------------------------------------------- [place] Details Patient Name: [name], [name] Date of Service: 01/04/2018 8:45 AM Medical Record Number: [id number] Patient Account Number: [id number] Date of Birth/Sex: 11/12/1970 (47 y.o. M) Treating RN: [name] Primary Care Solene Hereford: [name] Other Clinician: Referring Georgios Kina: [name] Treating Brentlee Delage/Extender:  ROBSON, MICHAEL G Weeks in Treatment: 1 Active Inactive ` Abuse / Safety / Falls / Self Care Management Nursing Diagnoses: Potential for falls Goals: Patient will remain injury free related to falls Date Initiated: 12/28/2017 Target Resolution Date: 04/08/2018 Goal Status:  Active Interventions: Assess fall risk on admission and as needed Notes: ` Nutrition Nursing Diagnoses: Impaired glucose control: actual or potential Goals: Patient/caregiver verbalizes understanding of need to maintain therapeutic glucose control per primary care physician Date Initiated: 12/28/2017 Target Resolution Date: 04/08/2018 Goal Status: Active Interventions: Provide education on elevated blood sugars and impact on wound healing Notes: ` Orientation to the Wound Care Program Nursing Diagnoses: Knowledge deficit related to the wound healing center program Goals: Patient/caregiver will verbalize understanding of the [facility] Date Initiated: 12/28/2017 Target Resolution Date: 04/08/2018 Goal Status: Active Interventions: Chait, Jaegar [name] ([id number]) Provide education on orientation to the wound center Notes: ` Wound/Skin Impairment Nursing Diagnoses: Impaired tissue integrity Goals: Ulcer/skin breakdown will heal within 14 weeks Date Initiated: 12/28/2017 Target Resolution Date: 04/08/2018 Goal Status: Active Interventions: Assess patient/caregiver ability to obtain necessary supplies Assess patient/caregiver ability to perform ulcer/skin care regimen upon admission and as needed Assess ulceration(s) every visit Notes: Electronic Signature(s) Signed: 01/04/2018 5:47:31 PM By: [name], BSN, RN, CWS, Kim RN, BSN Entered By: [name] on 01/04/2018 09:27:53 [name], [name] ([id number]) -------------------------------------------------------------------------------- Pain Assessment Details Patient Name: [name], [name] Date of Service: 01/04/2018 8:45 AM Medical Record Number: [id number] Patient Account Number: [id number] Date of Birth/Sex: 12/31/1970 (47 y.o. M) Treating RN: [name] Primary Care Cianna Kasparian: [name] Other Clinician: Referring Reggie Bise: [name] Treating Renatta Shrieves/Extender: [name] in Treatment:  1 Active Problems Location of Pain Severity and Description of Pain Patient Has Paino No Site Locations Pain Management and Medication Current Pain Management: Electronic Signature(s) Signed: 01/04/2018 5:47:31 PM By: [name], BSN, RN, CWS, Kim RN, BSN Signed: 01/25/2018 7:45:54 AM By: [name] Entered By: [name] on 01/04/2018 08:49:34 [name], [name] ([id number]) -------------------------------------------------------------------------------- Patient/Caregiver Education Details Patient Name: [name], [name] Date of Service: 01/04/2018 8:45 AM Medical Record Number: [id number] Patient Account Number: [id number] Date of Birth/Gender: 1970-12-13 (47 y.o. M) Treating RN: [name] Primary Care Physician: [name] Other Clinician: Referring Physician: Gaetano [name] Treating Physician/Extender: [name] in Treatment: 1 Education Assessment Education Provided To: Patient Education Topics Provided Wound/Skin Impairment: Handouts: Caring for Your Ulcer Methods: Explain/Verbal Responses: State content correctly Notes Patient weighed prior to discharge. Electronic Signature(s) Signed: 01/25/2018 7:45:54 AM By: [name] Entered By: [name] on 01/04/2018 10:03:55 Delbene, Esaul AMarland [name] ([id number]) -------------------------------------------------------------------------------- Wound Assessment Details Patient Name: [place], Dezman [name] Date of Service: 01/04/2018 8:45 AM Medical Record Number: [id number] Patient Account Number: [id number] Date of Birth/Sex: [birth year] (47 y.o. M) Treating RN: [name] Primary Care Kery Batzel: [name] Other Clinician: Referring Shawna Kiener: [name] Treating Chenae Brager/Extender: [name] in Treatment: 1 Wound Status Wound Number: 1 Primary Etiology: Diabetic Wound/Ulcer of the Lower Extremity Wound Location: Left Lower Leg - Medial, Proximal Secondary Lymphedema Wounding Event: Gradually  Appeared Etiology: Date Acquired: 09/30/2017 Wound Status: Open Weeks Of Treatment: 1 Comorbid History: Hypertension, Type II Diabetes, Clustered Wound: No Neuropathy Photos Photo Uploaded By: [name] on 01/04/2018 16:23:08 Wound Measurements Length: (cm) 1.5 Width: (cm) 3 Depth: (cm) 0.5 Area: (cm) 3.534 Volume: (cm) 1.767 % Reduction in Area: 12.1% % Reduction in Volume: 26.8% Epithelialization: None Tunneling: No Undermining: No Wound Description Classification: Grade 2 Wound Margin: Distinct,  outline attached Exudate Amount: Large Exudate Type: Serosanguineous Exudate Color: red, brown Foul Odor After Cleansing: No Slough/Fibrino Yes Wound Bed Granulation Amount: Medium (34-66%) Exposed Structure Necrotic Amount: Small (1-33%) Fascia Exposed: No Fat Layer (Subcutaneous Tissue) Exposed: Yes Tendon Exposed: No Muscle Exposed: No Joint Exposed: No Bone Exposed: No Periwound Skin Texture Aymond, Dyquan [name] ([id number]) Texture Color No Abnormalities Noted: No No Abnormalities Noted: No Callus: No Atrophie Blanche: No Crepitus: No Cyanosis: No Excoriation: No Ecchymosis: No Induration: No Erythema: No Rash: No Hemosiderin Staining: Yes Scarring: No Mottled: No Pallor: No Moisture Rubor: No No Abnormalities Noted: No Dry / Scaly: Yes Temperature / Pain Maceration: No Temperature: No Abnormality Wound Preparation Ulcer Cleansing: Rinsed/Irrigated with Saline, Other: soap and water, Topical Anesthetic Applied: Other: lidocaine 4%, Electronic Signature(s) Signed: 01/04/2018 5:47:31 PM By: [name], BSN, RN, CWS, Kim RN, BSN Signed: 01/25/2018 7:45:54 AM By: [name] Entered By: [name] on 01/04/2018 [place], [place] ([id number]) -------------------------------------------------------------------------------- Wound Assessment Details Patient Name: Hert, Arsenio [name] Date of Service: 01/04/2018 8:45 AM Medical Record Number:  [id number] Patient Account Number: [id number] Date of Birth/Sex: 08/09/1971 (47 y.o. M) Treating RN: [name] Primary Care [name] Harlin: [name] Other Clinician: Referring Jolonda Gomm: [name] Treating Edye Hainline/Extender: [name] in Treatment: 1 Wound Status Wound Number: 2 Primary Etiology: Diabetic Wound/Ulcer of the Lower Extremity Wound Location: Left Lower Leg - Lateral Secondary Lymphedema Wounding Event: Gradually Appeared Etiology: Date Acquired: 09/30/2017 Wound Status: Open Weeks Of Treatment: 1 Comorbid History: Hypertension, Type II Diabetes, Clustered Wound: No Neuropathy Photos Photo Uploaded By: [name] on 01/04/2018 16:23:09 Wound Measurements Length: (cm) 4.2 Width: (cm) 7.6 Depth: (cm) 0.9 Area: (cm) 25.07 Volume: (cm) 22.563 % Reduction in Area: 11.3% % Reduction in Volume: -99.5% Epithelialization: None Tunneling: No Undermining: No Wound Description Classification: Grade 2 Wound Margin: Distinct, outline attached Exudate Amount: Large Exudate Type: Purulent Exudate Color: yellow, brown, green Foul Odor After Cleansing: Yes Due to Product Use: No Slough/Fibrino Yes Wound Bed Granulation Amount: Medium (34-66%) Exposed Structure Granulation Quality: Red, Pink Fascia Exposed: No Necrotic Amount: Medium (34-66%) Fat Layer (Subcutaneous Tissue) Exposed: Yes Necrotic Quality: Eschar, Adherent Slough Tendon Exposed: No Muscle Exposed: No Joint Exposed: No Bone Exposed: No Periwound Skin Texture Deweese, Raven [name] ([id number]) Texture Color No Abnormalities Noted: No No Abnormalities Noted: No Callus: No Atrophie Blanche: No Crepitus: No Cyanosis: No Excoriation: No Ecchymosis: No Induration: No Erythema: No Rash: No Hemosiderin Staining: No Scarring: No Mottled: No Pallor: No Moisture Rubor: No No Abnormalities Noted: No Dry / Scaly: No Temperature / Pain Maceration: Yes Temperature: No  Abnormality Tenderness on Palpation: Yes Wound Preparation Ulcer Cleansing: Rinsed/Irrigated with Saline, Other: soap and water, Topical Anesthetic Applied: Other: lidocaine 4%, Electronic Signature(s) Signed: 01/04/2018 5:47:31 PM By: [name], BSN, RN, CWS, Kim RN, BSN Signed: 01/25/2018 7:45:54 AM By: [name] Entered By: [name] on 01/04/2018 09:10:10 Fosdick, Dewain [name] ([id number]) -------------------------------------------------------------------------------- [place] Details Patient Name: Garrott, Esteven [name] Date of Service: 01/04/2018 8:45 AM Medical Record Number: [id number] Patient Account Number: [id number] Date of Birth/Sex: December 27, 1970 (47 y.o. M) Treating RN: [name] Primary Care Trew Sunde: [name] Other Clinician: Referring Angila Wombles: [name] Treating Patty Leitzke/Extender: [name] in Treatment: 1 Vital Signs Time Taken: 08:50 Temperature (F): 98.1 Height (in): 76 Pulse (bpm): 65 Weight (lbs): 377.6 Respiratory Rate (breaths/min): 16 Source: Measured Blood Pressure (mmHg): 166/74 Body Mass Index (BMI): 46 Reference Range: 80 - 120 mg / dl Electronic Signature(s) Signed: 01/04/2018 5:47:31  PM By: [name], BSN, RN, CWS, Kim RN, BSN Entered By: [name] on 01/04/2018 10:04:39

## 2018-01-27 NOTE — Progress Notes (Signed)
[name] ([id number]) Visit Report for 01/25/2018 HPI Details Patient Name: [name], [name] Date of Service: 01/25/2018 9:30 AM Medical Record Number: [id number] Patient Account Number: [id number] Date of Birth/Sex: 12-03-1970 (47 y.o. M) Treating RN: [name] Primary Care Provider: Gaetano [name] Other Clinician: Referring Provider: Gaetano [name] Treating Provider/Extender: [name] in Treatment: 4 History of Present Illness HPI Description: 12/28/17; this is a now 47 year old man who is a type II diabetic. He was hospitalized from 10/01/17 through 10/19/17. He had an MSSA soft tissue and skin infection. 2 open areas on the left leg were identified he has a smaller area on the left medial calf superiorly just below the knee and a wound just above the left ankle on the posterior medial aspect. I think both of these were surgical IandD sites when he was in the hospital. He was discharged with a wound VAC at that point however this is since been taken off. He follows with [name] for the [facility] and he is still on chronic Keflex at 500 twice a day. At that time he was hospitalized his hemoglobin A1c was 15.1 however if I'm reading his endocrinologist notes correctly that is improved. He has been following with [name] at vein and vascular and he has been applying calcium alginate and Unna boots. He has home health changing the dressing. They have also been attempting to get him external compression pumps although the patient is unaware whether they've been approved by insurance at this point. as mentioned he has a smaller clean wound on the right lateral calf just below the knee and he has a much larger area just above the left ankle medially and posteriorly. Our intake nurse reported greenish purulent looking drainage.the patient did have surgical material sent to pathology in February. This showed chronic abscess The patient also has lymphedema stage III in the left  greater than right lower extremities. He has a history of blisters with wounds but these of all were always healed. The patient thinks that the lymphedema may have been present since he was about 47 years old i.e. about 30 years. He does not have graded pressure stockings and has not worn stockings. He does not have a distant history of DVT PE or phlebitis. He has not been systemically unwell fever no chills. He states that his Lasix is recently been reduced. He tells me his kidney function is at "30%" and he has been followed by Dr. Candiss [name] of nephrology. At one point he was on Lasix 80 twice a day however that's been cut back and he is now on Lasix at 20 twice a day. The patient has a history of PAD listed in his records although he comes from [name] I don't think is felt to have significant PAD. ABIs in our clinic were noncompressible bilaterally. 01/04/18; patient has a large wound on the left lateral lower calf and a small wound on the left medial upper calf. He has been to see his nephrologist who changed him to Demadex 40 mg a day. I'm hopeful this will help with his systemic fluid overload. He also has stage III lymphedema. Really no change in the 2 wounds since last week 01/11/18; the patient is down 13 pounds. He put his stage III lymphedema left leg in 4K compression last week and there is less edema fluid however we still haven't been able to communicate with home health but [facility] but the dressings have not been changed. The  patient noted an odor last week. He is also had compression pumps ordered by [facility] this as a not completed the paperwork stage. 01/18/18; patient continues to lose weight. Stage III lymphedema in the left greater than right leg under for alert compression. The major wound is on the left lateral ankle area. He apparently has bilateral compression pumps being brought to his house, these were ordered by [facility]  [facility] Notable for the fact today he had some blisters on the right anterior leg together with some skin nodules. This is no doubt secondary to severe lymphedema. 01/25/18; the patient has obtained his compression pumps and is using them per vein and vascular instructions 3 times a day for an hour. He also saw [name] of vein and vascular. He was felt to have venous insufficiency but did not suggest any intervention also improved edema. It was suggested that he have compression stockings 20-30 mm on a daily basis in addition to compression pumps. [name], [name] ([id number]) The patient arrives in clinic today with a layer of unna under for layer compression. he seems to have some trouble with the degree of compression. He has open areas on the left lateral ankle area which is his major wound left upper medial calf and a superficial open area on the right anterior shin area which was blistered last week. He has skin changes on the right anterior calf which I think are no doubt secondary to lymphedema skin nodules etc. Electronic Signature(s) Signed: 01/26/2018 8:24:56 AM By: [name] Entered By: [name] Ham on 01/25/2018 10:33:37 [name], [name] ([id number]) -------------------------------------------------------------------------------- Physical Exam Details Patient Name: [name], [name] Date of Service: 01/25/2018 9:30 AM Medical Record Number: [id number] Patient Account Number: [id number] Date of Birth/Sex: [birth year] (47 y.o. M) Treating RN: [name] Primary Care Provider: Gaetano [name] Other Clinician: Referring Provider: Gaetano [name] Treating Provider/Extender: [name] in Treatment: 4 Constitutional Patient is hypertensive.. Pulse regular and within target range for patient.[name] Respirations regular, non-labored and within target range.. Temperature is normal and within the target range for the patient.[name] appears in no distress. Eyes Conjunctivae clear. No  discharge. Respiratory Respiratory effort is easy and symmetric bilaterally. Rate is normal at rest and on room air.. Cardiovascular Pedal pulses palpable and strong bilaterally.. much better edema control on both legs most noticeable on the left. Lymphatic none palpable in the popliteal or inguinal area. Integumentary (Hair, Skin) there are subcutaneous nodules some fissured skin anteriorly on the right which I think is all secondary to prolonged lymphedema.[name] Psychiatric No evidence of depression, anxiety, or agitation. Calm, cooperative, and communicative. Appropriate interactions and affect.. Notes wound exam; the upper medial calf wound on the left is just about closed again. The area on the left lateral lower calf/ankle is still his major wound small amount of tunneling superiorly I think is about the same although the wound area looks better. oSuperficial area on the right interior thigh Electronic Signature(s) Signed: 01/26/2018 8:24:56 AM By: [name] Entered By: [name] Ham on 01/25/2018 10:37:17 Faith, [name] Kitchen ([id number]) -------------------------------------------------------------------------------- Physician Orders Details Patient Name: [name], [name] Date of Service: 01/25/2018 9:30 AM Medical Record Number: [id number] Patient Account Number: [id number] Date of Birth/Sex: Dec 28, 1970 (47 y.o. M) Treating RN: Roger Shelter Primary Care Provider: Gaetano [name] Other Clinician: Referring Provider: Gaetano [name] Treating Provider/Extender: [name] in Treatment: 4 Verbal / Phone Orders: No Diagnosis Coding Wound Cleansing Wound #1 Left,Proximal,Medial Lower Leg o Clean wound  with Normal Saline. Wound #2 Left,Lateral,Posterior Lower Leg o Clean wound with Normal Saline. Wound #4 Right,Anterior Lower Leg o Clean wound with Normal Saline. Anesthetic (add to Medication List) Wound #1 Left,Proximal,Medial Lower Leg o Topical  Lidocaine 4% cream applied to wound bed prior to debridement (In Clinic Only). Wound #2 Left,Lateral,Posterior Lower Leg o Topical Lidocaine 4% cream applied to wound bed prior to debridement (In Clinic Only). Wound #4 Right,Anterior Lower Leg o Topical Lidocaine 4% cream applied to wound bed prior to debridement (In Clinic Only). Skin Barriers/Peri-Wound Care Wound #1 Left,Proximal,Medial Lower Leg o Barrier cream Wound #2 Left,Lateral,Posterior Lower Leg o Barrier cream Wound #4 Right,Anterior Lower Leg o Barrier cream Primary Wound Dressing Wound #1 Left,Proximal,Medial Lower Leg o Silver Alginate Wound #2 Left,Lateral,Posterior Lower Leg o Silver Alginate Wound #4 Right,Anterior Lower Leg o Silver Alginate Secondary Dressing Wound #1 Left,Proximal,Medial Lower Leg o ABD pad o XtraSorb Delaughter, Remington A. ([id number]) Wound #2 Left,Lateral,Posterior Lower Leg o ABD pad o XtraSorb Wound #4 Right,Anterior Lower Leg o ABD pad o XtraSorb Dressing Change Frequency Wound #1 Left,Proximal,Medial Lower Leg o Change Dressing Monday, Wednesday, Friday - Wednesday in clinic. Wound #2 Left,Lateral,Posterior Lower Leg o Change Dressing Monday, Wednesday, Friday - Wednesday in clinic. Follow-up Appointments Wound #1 Left,Proximal,Medial Lower Leg o Return Appointment in 1 week. Wound #2 Left,Lateral,Posterior Lower Leg o Return Appointment in 1 week. Edema Control Wound #1 Left,Proximal,Medial Lower Leg o 4 Layer Compression System - Bilateral - unna boot to anchor only , place 2 finger widths below the knees and 2 finger widths below the toes bilateral. single wrap only. do not wrap the complete leg with the unna material. Wound #2 Left,Lateral,Posterior Lower Leg o 4 Layer Compression System - Bilateral - unna boot to anchor only , place 2 finger widths below the knees and 2 finger widths below the toes bilateral. single wrap only. do not wrap  the complete leg with the unna material. Home Health Wound #1 Left,Proximal,Medial Lower Leg o [place] Visits - Monday and Friday o Home Health Nurse may visit PRN to address patientos wound care needs. o FACE TO FACE ENCOUNTER: MEDICARE and MEDICAID PATIENTS: I certify that this patient is under my care and that I had a face-to-face encounter that meets the physician face-to-face encounter requirements with this patient on this date. The encounter with the patient was in whole or in part for the following MEDICAL CONDITION: (primary reason for [place]) MEDICAL NECESSITY: I certify, that based on my findings, NURSING services are a medically necessary home health service. HOME BOUND STATUS: I certify that my clinical findings support that this patient is homebound (i.e., Due to illness or injury, pt requires aid of supportive devices such as crutches, cane, wheelchairs, walkers, the use of special transportation or the assistance of another person to leave their place of residence. There is a normal inability to leave the home and doing so requires considerable and taxing effort. Other absences are for medical reasons / religious services and are infrequent or of short duration when for other reasons). o If current dressing causes regression in wound condition, may D/C ordered dressing product/s and apply Normal Saline Moist Dressing daily until next [place] / Other [name] appointment. [place] of regression in wound condition at [phone]. Wound #2 [place] Visits - Monday and Friday o Home Health Nurse may visit PRN to address patientos wound care needs. o FACE TO FACE  ENCOUNTER: MEDICARE and MEDICAID PATIENTS: I certify that this patient is under my care and that I had a face-to-face encounter that meets the physician face-to-face encounter requirements with this patient on this  date. The encounter with the patient was in whole or in part for the following [place]. ([id number]) CONDITION: (primary reason for Home Healthcare) MEDICAL NECESSITY: I certify, that based on my findings, NURSING services are a medically necessary home health service. HOME BOUND STATUS: I certify that my clinical findings support that this patient is homebound (i.e., Due to illness or injury, pt requires aid of supportive devices such as crutches, cane, wheelchairs, walkers, the use of special transportation or the assistance of another person to leave their place of residence. There is a normal inability to leave the home and doing so requires considerable and taxing effort. Other absences are for medical reasons / religious services and are infrequent or of short duration when for other reasons). o If current dressing causes regression in wound condition, may D/C ordered dressing product/s and apply Normal Saline Moist Dressing daily until next [place] / Other [name] appointment. [place] of regression in wound condition at [phone]. Medications-please add to medication list. Wound #1 Left,Proximal,Medial Lower Leg o P.O. Antibiotics - Continue antibiotics Wound #2 Left,Lateral,Posterior Lower Leg o P.O. Antibiotics - Continue antibiotics Electronic Signature(s) Signed: 01/25/2018 4:15:53 PM By: Roger Shelter Signed: 01/26/2018 8:24:56 AM By: [name] Entered By: Roger Shelter on 01/25/2018 10:13:56 [name], [name] ([id number]) -------------------------------------------------------------------------------- Problem List Details Patient Name: [name], [name] Date of Service: 01/25/2018 9:30 AM Medical Record Number: [id number] Patient Account Number: [id number] Date of Birth/Sex: 08-Jan-1971 (47 y.o. 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 ([id number]) -------------------------------------------------------------------------------- Progress Note Details Patient Name: [name], [name] Date of Service: 01/25/2018 9:30 AM Medical Record Number: [id number] Patient Account Number: [id number] Date of Birth/Sex: 18-Jun-1971 (47 y.o. M) Treating RN: [name] Primary Care Provider: Gaetano [name] Other Clinician: Referring Provider: Gaetano [name] Treating Provider/Extender: [name] in Treatment: 4 Subjective History of Present Illness (HPI) 12/28/17; this is a now 47 year old man who is a type II diabetic. He was hospitalized from 10/01/17 through 10/19/17. He had an MSSA soft tissue and skin infection. 2 open areas on the left leg were identified he has a smaller area on the left medial calf superiorly just below the knee and a wound just above the left ankle on the posterior medial aspect. I think both of these were surgical IandD sites when he was in the hospital. He was discharged with a wound VAC at that point however this is since been taken off. He follows with [name] for the [facility] and he is still on chronic Keflex at 500 twice a day. At that time he was hospitalized his hemoglobin A1c was 15.1 however if I'm reading his endocrinologist notes correctly that is  improved.  He has been following with [name] at vein and vascular and he has been applying calcium alginate and Unna boots. He has home health changing the dressing. They have also been attempting to get him external compression pumps although the patient is unaware whether they've been approved by insurance at this point. as mentioned he has a smaller clean wound on the right lateral calf just below the knee and he has a much larger area just above the left ankle medially and posteriorly. Our intake nurse reported greenish purulent looking drainage.the patient did have surgical material sent to pathology in February. This showed chronic abscess The patient also has lymphedema stage III in the left greater than right lower extremities. He has a history of blisters with wounds but these of all were always healed. The patient thinks that the lymphedema may have been present since he was about 47 years old i.e. about 30 years. He does not have graded pressure stockings and has not worn stockings. He does not have a distant history of DVT PE or phlebitis. He has not been systemically unwell fever no chills. He states that his Lasix is recently been reduced. He tells me his kidney function is at "30%" and he has been followed by Dr. Candiss [name] of nephrology. At one point he was on Lasix 80 twice a day however that's been cut back and he is now on Lasix at 20 twice a day. The patient has a history of PAD listed in his records although he comes from [name] I don't think is felt to have significant PAD. ABIs in our clinic were noncompressible bilaterally. 01/04/18; patient has a large wound on the left lateral lower calf and a small wound on the left medial upper calf. He has been to see his nephrologist who changed him to Demadex 40 mg a day. I'm hopeful this will help with his systemic fluid overload. He also has stage III lymphedema. Really no change in the 2 wounds since last week 01/11/18; the  patient is down 13 pounds. He put his stage III lymphedema left leg in 4K compression last week and there is less edema fluid however we still haven't been able to communicate with home health but [facility] but the dressings have not been changed. The patient noted an odor last week. He is also had compression pumps ordered by [facility] this as a not completed the paperwork stage. 01/18/18; patient continues to lose weight. Stage III lymphedema in the left greater than right leg under for alert compression. The major wound is on the left lateral ankle area. He apparently has bilateral compression pumps being brought to his house, these were ordered by [facility] Notable for the fact today he had some blisters on the right anterior leg together with some skin nodules. This is no doubt secondary to severe lymphedema. 01/25/18; the patient has obtained his compression pumps and is using them per vein and vascular instructions 3 times a day for an hour. He also saw [name] of vein and vascular. He was felt to have venous insufficiency but did not suggest any intervention also improved edema. It was suggested that he have compression stockings 20-30 mm on a daily basis in addition to compression pumps. The patient arrives in clinic today with a layer of unna under for layer compression. he seems to have some trouble with the degree of compression. He has open areas on the left lateral ankle area which  is his major wound left upper medial calf and a [name], [name] ([id number]) superficial open area on the right anterior shin area which was blistered last week. He has skin changes on the right anterior calf which I think are no doubt secondary to lymphedema skin nodules etc. Objective Constitutional Patient is hypertensive.. Pulse regular and within target range for patient.[name] Respirations regular, non-labored and within target range.. Temperature is normal  and within the target range for the patient.[name] appears in no distress. Vitals Time Taken: 9:32 AM, Height: 76 in, Weight: 364.4 lbs, BMI: 44.4, Temperature: 98.2 F, Pulse: 64 bpm, Respiratory Rate: 18 breaths/min, Blood Pressure: 168/74 mmHg. Eyes Conjunctivae clear. No discharge. Respiratory Respiratory effort is easy and symmetric bilaterally. Rate is normal at rest and on room air.. Cardiovascular Pedal pulses palpable and strong bilaterally.. much better edema control on both legs most noticeable on the left. Lymphatic none palpable in the popliteal or inguinal area. Psychiatric No evidence of depression, anxiety, or agitation. Calm, cooperative, and communicative. Appropriate interactions and affect.. General Notes: wound exam; the upper medial calf wound on the left is just about closed again. The area on the left lateral lower calf/ankle is still his major wound small amount of tunneling superiorly I think is about the same although the wound area looks better. Superficial area on the right interior thigh Integumentary (Hair, Skin) there are subcutaneous nodules some fissured skin anteriorly on the right which I think is all secondary to prolonged lymphedema.. Wound #1 status is Open. Original cause of wound was Gradually Appeared. The wound is located on the Left,Proximal,Medial Lower Leg. The wound measures 0.5cm length x 0.3cm width x 0.1cm depth; 0.118cm^2 area and 0.012cm^3 volume. There is Fat Layer (Subcutaneous Tissue) Exposed exposed. There is no tunneling or undermining noted. There is a medium amount of serosanguineous drainage noted. Foul odor after cleansing was noted. The wound margin is distinct with the outline attached to the wound base. There is large (67-100%) red granulation within the wound bed. There is a small (1-33%) amount of necrotic tissue within the wound bed including Adherent Slough. The periwound skin appearance exhibited: Hemosiderin Staining. The  periwound skin appearance did not exhibit: Callus, Crepitus, Excoriation, Induration, Rash, Scarring, Dry/Scaly, Maceration, Atrophie Blanche, Cyanosis, Ecchymosis, Mottled, Pallor, Rubor, Erythema. Periwound temperature was noted as No Abnormality. Wound #2 status is Open. Original cause of wound was Gradually Appeared. The wound is located on the Left,Lateral,Posterior Lower Leg. The wound measures 3.6cm length x 7.3cm width x 0.7cm depth; 20.64cm^2 area and 14.448cm^3 volume. There is Fat Layer (Subcutaneous Tissue) Exposed exposed. There is no tunneling noted, however, there is undermining starting at 11:00 and ending at 1:00 with a maximum distance of 0.8cm. There is a large amount of [name], [name] ([id number]) purulent drainage noted. Foul odor after cleansing was noted. The wound margin is distinct with the outline attached to the wound base. There is medium (34-66%) red, pink granulation within the wound bed. There is a medium (34-66%) amount of necrotic tissue within the wound bed including Adherent Slough. The periwound skin appearance exhibited: Maceration. The periwound skin appearance did not exhibit: Callus, Crepitus, Excoriation, Induration, Rash, Scarring, Dry/Scaly, Atrophie Blanche, Cyanosis, Ecchymosis, Hemosiderin Staining, Mottled, Pallor, Rubor, Erythema. Periwound temperature was noted as No Abnormality. The periwound has tenderness on palpation. Wound #3 status is Healed - Epithelialized. Original cause of wound was Gradually Appeared. The wound is located on the Left,Medial,Anterior Lower Leg. The wound measures 0cm length x 0cm width x  0cm depth; 0cm^2 area and 0cm^3 volume. Wound #4 status is Open. Original cause of wound was Gradually Appeared. The wound is located on the Right,Anterior Lower Leg. The wound measures 0.2cm length x 0.3cm width x 0.1cm depth; 0.047cm^2 area and 0.005cm^3 volume. There is no tunneling or undermining noted. There is a large amount of  drainage noted. The wound margin is distinct with the outline attached to the wound base. There is small (1-33%) red granulation within the wound bed. There is a large (67-100%) amount of necrotic tissue within the wound bed including Eschar. Periwound temperature was noted as No Abnormality. The periwound has tenderness on palpation. Assessment Active Problems ICD-10 L97.223 - Non-pressure chronic ulcer of left calf with necrosis of muscle I89.0 - Lymphedema, not elsewhere classified I87.332 - Chronic venous hypertension (idiopathic) with ulcer and inflammation of left lower extremity E11.622 - Type 2 diabetes mellitus with other skin ulcer Plan Wound Cleansing: Wound #1 Left,Proximal,Medial Lower Leg: Clean wound with Normal Saline. Wound #2 Left,Lateral,Posterior Lower Leg: Clean wound with Normal Saline. Wound #4 Right,Anterior Lower Leg: Clean wound with Normal Saline. Anesthetic (add to Medication List): Wound #1 Left,Proximal,Medial Lower Leg: Topical Lidocaine 4% cream applied to wound bed prior to debridement (In Clinic Only). Wound #2 Left,Lateral,Posterior Lower Leg: Topical Lidocaine 4% cream applied to wound bed prior to debridement (In Clinic Only). Wound #4 Right,Anterior Lower Leg: Topical Lidocaine 4% cream applied to wound bed prior to debridement (In Clinic Only). Skin Barriers/Peri-Wound Care: Wound #1 Left,Proximal,Medial Lower Leg: Barrier cream Wound #2 Left,Lateral,Posterior Lower Leg: Barrier cream Wound #4 Right,Anterior Lower Leg: Barrier cream [name], [name] ([id number]) Primary Wound Dressing: Wound #1 Left,Proximal,Medial Lower Leg: Silver Alginate Wound #2 Left,Lateral,Posterior Lower Leg: Silver Alginate Wound #4 Right,Anterior Lower Leg: Silver Alginate Secondary Dressing: Wound #1 Left,Proximal,Medial Lower Leg: ABD pad XtraSorb Wound #2 Left,Lateral,Posterior Lower Leg: ABD pad XtraSorb Wound #4 Right,Anterior Lower Leg: ABD  pad XtraSorb Dressing Change Frequency: Wound #1 Left,Proximal,Medial Lower Leg: Change Dressing Monday, Wednesday, Friday - Wednesday in clinic. Wound #2 Left,Lateral,Posterior Lower Leg: Change Dressing Monday, Wednesday, Friday - Wednesday in clinic. Follow-up Appointments: Wound #1 Left,Proximal,Medial Lower Leg: Return Appointment in 1 week. Wound #2 Left,Lateral,Posterior Lower Leg: Return Appointment in 1 week. Edema Control: Wound #1 Left,Proximal,Medial Lower Leg: 4 Layer Compression System - Bilateral - unna boot to anchor only , place 2 finger widths below the knees and 2 finger widths below the toes bilateral. single wrap only. do not wrap the complete leg with the unna material. Wound #2 Left,Lateral,Posterior Lower Leg: 4 Layer Compression System - Bilateral - unna boot to anchor only , place 2 finger widths below the knees and 2 finger widths below the toes bilateral. single wrap only. do not wrap the complete leg with the unna material. Home Health: Wound #1 Left,Proximal,Medial Lower Leg: Continue Home Health Visits - Monday and Friday Home Health Nurse may visit PRN to address patient s wound care needs. FACE TO FACE ENCOUNTER: MEDICARE and MEDICAID PATIENTS: I certify that this patient is under my care and that I had a face-to-face encounter that meets the physician face-to-face encounter requirements with this patient on this date. The encounter with the patient was in whole or in part for the following MEDICAL CONDITION: (primary reason for [place]) MEDICAL NECESSITY: I certify, that based on my findings, NURSING services are a medically necessary home health service. HOME BOUND STATUS: I certify that my clinical findings support that this patient is homebound (i.e., Due to  illness or injury, pt requires aid of supportive devices such as crutches, cane, wheelchairs, walkers, the use of special transportation or the assistance of another person to leave their  place of residence. There is a normal inability to leave the home and doing so requires considerable and taxing effort. Other absences are for medical reasons / religious services and are infrequent or of short duration when for other reasons). If current dressing causes regression in wound condition, may D/C ordered dressing product/s and apply Normal Saline Moist Dressing daily until next [place] / Other [name] appointment. [place] of regression in wound condition at [phone]. Wound #2 Left,Lateral,Posterior Lower Leg: Continue Home Health Visits - Monday and Friday Home Health Nurse may visit PRN to address patient s wound care needs. FACE TO FACE ENCOUNTER: MEDICARE and MEDICAID PATIENTS: I certify that this patient is under my care and that I had a face-to-face encounter that meets the physician face-to-face encounter requirements with this patient on this date. The encounter with the patient was in whole or in part for the following MEDICAL CONDITION: (primary reason for [place]) MEDICAL NECESSITY: I certify, that based on my findings, NURSING services are a medically necessary home health service. HOME BOUND STATUS: I certify that my clinical findings support that this patient is homebound (i.e., Due to illness or injury, pt requires aid of supportive devices such as crutches, cane, wheelchairs, walkers, the use of special transportation or the assistance of another person to leave their place of residence. There is a normal inability to leave the [name], [name] ([id number]) home and doing so requires considerable and taxing effort. Other absences are for medical reasons / religious services and are infrequent or of short duration when for other reasons). If current dressing causes regression in wound condition, may D/C ordered dressing product/s and apply Normal Saline Moist Dressing daily until next [place] / Other [name] appointment.  [place] of regression in wound condition at [phone]. Medications-please add to medication list.: Wound #1 Left,Proximal,Medial Lower Leg: P.O. Antibiotics - Continue antibiotics Wound #2 Left,Lateral,Posterior Lower Leg: P.O. Antibiotics - Continue antibiotics #1 I think we continue with silver alginate is the primary dressing here. #2 the compression pumps have certainly help with the edema/lymphedema most noticeable on the left. However I think he is going to have to continue with 4 layer compression bilaterally until we are able to control the swelling. He has not yet ready to transition into stockings until we get proper measurements which we can only obtain after controlling the edema. I'm a bit surprised by the 20-30 mm recommendation from vein and vascular however this may be because he has the pumps at home. #3 I see no evidence of infection and no evidence of systemic fluid volume overload Electronic Signature(s) Signed: 01/26/2018 8:24:56 AM By: [name] Entered By: [name] Ham on 01/25/2018 10:39:28 Packard, [name] A. ([id number]) -------------------------------------------------------------------------------- SuperBill Details Patient Name: Culotta, Sanad A. Date of Service: 01/25/2018 Medical Record Number: [id number] Patient Account Number: [id number] Date of Birth/Sex: 03/04/1971 (47 y.o. M) Treating RN: Roger Shelter Primary Care Provider: Gaetano [name] Other Clinician: Referring Provider: Gaetano [name] Treating Provider/Extender: [name] in Treatment: 4 Diagnosis Coding ICD-10 Codes Code Description [phone] Non-pressure chronic ulcer of left calf with necrosis of muscle I89.0 Lymphedema, not elsewhere classified I87.332 Chronic venous hypertension (idiopathic) with ulcer and inflammation of left lower extremity E11.622 Type 2 diabetes mellitus with other skin ulcer Facility Procedures  CPT4 Code:  [id number] Description: [place] VISIT-LEV 4 EST PT Modifier: Quantity: 1 Physician Procedures CPT4: Description Modifier Quantity Code [id number] - WC PHYS LEVEL 3 - EST PT 1 ICD-10 Diagnosis Description L97.223 Non-pressure chronic ulcer of left calf with necrosis of muscle I87.332 Chronic venous hypertension (idiopathic) with ulcer and  inflammation of left lower extremity Electronic Signature(s) Signed: 01/26/2018 8:24:56 AM By: [name] Entered By: [name] Ham on 01/25/2018 10:40:04

## 2018-01-29 NOTE — Progress Notes (Signed)
CRUE, OTERO ([id number]) Visit Report for 01/25/2018 Arrival Information Details Patient Name: [name], [name] Date of Service: 01/25/2018 9:30 AM Medical Record Number: [id number] Patient Account Number: [id number] Date of Birth/Sex: 07-Mar-1971 (47 y.o. M) Treating RN: Ahmed Prima Primary Care Hamdi Kley: Gaetano Net Other Clinician: Referring Ikran Patman: Gaetano Net Treating Lon Klippel/Extender: Tito Dine in Treatment: 4 Visit Information History Since Last Visit All ordered tests and consults were completed: No Patient Arrived: Ambulatory Added or deleted any medications: No Arrival Time: 09:29 Any new allergies or adverse reactions: No Accompanied By: self Had a fall or experienced change in No Transfer Assistance: None activities of daily living that may affect Patient Identification Verified: Yes risk of falls: Secondary Verification Process Yes Signs or symptoms of abuse/neglect since last visito No Completed: Hospitalized since last visit: No Patient Requires Transmission-Based No Implantable device outside of the clinic excluding No Precautions: cellular tissue based products placed in the center Patient Has Alerts: Yes since last visit: Patient Alerts: DM II Has Dressing in Place as Prescribed: Yes noncompressible Has Compression in Place as Prescribed: Yes bilateral Pain Present Now: No Electronic Signature(s) Signed: 01/27/2018 5:38:20 PM By: Alric Quan Entered By: Alric Quan on 01/25/2018 09:29:45 Longan, Clayvon A. ([id number]) -------------------------------------------------------------------------------- Clinic Level of Care Assessment Details Patient Name: [name], [name] Date of Service: 01/25/2018 9:30 AM Medical Record Number: [id number] Patient Account Number: [id number] Date of Birth/Sex: November 15, 1970 (47 y.o. M) Treating RN: Roger Shelter Primary Care Susann Lawhorne: Gaetano Net Other Clinician: Referring Markevious Ehmke: Gaetano Net Treating Gregory Barrick/Extender: Tito Dine in Treatment: 4 Clinic Level of Care Assessment Items TOOL 4 Quantity Score X - Use when only an EandM is performed on FOLLOW-UP visit 1 0 ASSESSMENTS - Nursing Assessment / Reassessment X - Reassessment of Co-morbidities (includes updates in patient status) 1 10 X- 1 5 Reassessment of Adherence to Treatment Plan ASSESSMENTS - Wound and Skin Assessment / Reassessment []  - Simple Wound Assessment / Reassessment - one wound 0 X- 3 5 Complex Wound Assessment / Reassessment - multiple wounds []  - 0 Dermatologic / Skin Assessment (not related to wound area) ASSESSMENTS - Focused Assessment []  - Circumferential Edema Measurements - multi extremities 0 []  - 0 Nutritional Assessment / Counseling / Intervention []  - 0 Lower Extremity Assessment (monofilament, tuning fork, pulses) []  - 0 Peripheral Arterial Disease Assessment (using hand held doppler) ASSESSMENTS - Ostomy and/or Continence Assessment and Care []  - Incontinence Assessment and Management 0 []  - 0 Ostomy Care Assessment and Management (repouching, etc.) PROCESS - Coordination of Care []  - Simple Patient / Family Education for ongoing care 0 X- 1 20 Complex (extensive) Patient / Family Education for ongoing care []  - 0 Staff obtains Programmer, systems, Records, Test Results / Process Orders []  - 0 Staff telephones HHA, Nursing Homes / Clarify orders / etc []  - 0 Routine Transfer to another Facility (non-emergent condition) []  - 0 Routine Hospital Admission (non-emergent condition) []  - 0 New Admissions / Biomedical engineer / Ordering NPWT, Apligraf, etc. []  - 0 Emergency Hospital Admission (emergent condition) []  - 0 Simple Discharge Coordination Lederman, Lennard A. ([id number]) X- 1 15 Complex (extensive) Discharge Coordination PROCESS - Special Needs []  - Pediatric / Minor Patient Management 0 []  - 0 Isolation Patient Management []  - 0 Hearing / Language /  Visual special needs []  - 0 Assessment of Community assistance (transportation, D/C planning, etc.) []  - 0 Additional assistance / Altered mentation []  - 0 Support Surface(s) Assessment (bed, cushion, seat, etc.) INTERVENTIONS -  Wound Cleansing / Measurement []  - Simple Wound Cleansing - one wound 0 X- 3 5 Complex Wound Cleansing - multiple wounds X- 1 5 Wound Imaging (photographs - any number of wounds) []  - 0 Wound Tracing (instead of photographs) []  - 0 Simple Wound Measurement - one wound X- 3 5 Complex Wound Measurement - multiple wounds INTERVENTIONS - Wound Dressings X - Small Wound Dressing one or multiple wounds 2 10 X- 1 15 Medium Wound Dressing one or multiple wounds []  - 0 Large Wound Dressing one or multiple wounds []  - 0 Application of Medications - topical []  - 0 Application of Medications - injection INTERVENTIONS - Miscellaneous []  - External ear exam 0 []  - 0 Specimen Collection (cultures, biopsies, blood, body fluids, etc.) []  - 0 Specimen(s) / Culture(s) sent or taken to Lab for analysis []  - 0 Patient Transfer (multiple staff / Civil Service fast streamer / Similar devices) []  - 0 Simple Staple / Suture removal (25 or less) []  - 0 Complex Staple / Suture removal (26 or more) []  - 0 Hypo / Hyperglycemic Management (close monitor of Blood Glucose) []  - 0 Ankle / Brachial Index (ABI) - do not check if billed separately X- 1 5 Vital Signs Fayson, Knoxx A. ([id number]) Has the patient been seen at the hospital within the last three years: Yes Total Score: 140 Level Of Care: New/Established - Level 4 Electronic Signature(s) Signed: 01/25/2018 4:15:53 PM By: Roger Shelter Entered By: Roger Shelter on 01/25/2018 10:15:33 Dufault, Jhase A. ([id number]) -------------------------------------------------------------------------------- Encounter Discharge Information Details Patient Name: [name], [name] Date of Service: 01/25/2018 9:30 AM Medical Record Number:  [id number] Patient Account Number: [id number] Date of Birth/Sex: 23-Sep-1970 (47 y.o. M) Treating RN: Montey Hora Primary Care Derian Dimalanta: Gaetano Net Other Clinician: Referring Kiyra Slaubaugh: Gaetano Net Treating Renzo Vincelette/Extender: Tito Dine in Treatment: 4 Encounter Discharge Information Items Discharge Condition: Stable Ambulatory Status: Ambulatory Discharge Destination: Home Transportation: Private Auto Accompanied By: self Schedule Follow-up Appointment: Yes Clinical Summary of Care: Electronic Signature(s) Signed: 01/25/2018 12:02:45 PM By: Montey Hora Entered By: Montey Hora on 01/25/2018 12:02:44 Rochin, Alioune A. ([id number]) -------------------------------------------------------------------------------- Lower Extremity Assessment Details Patient Name: Schofield, Daren A. Date of Service: 01/25/2018 9:30 AM Medical Record Number: [id number] Patient Account Number: [id number] Date of Birth/Sex: [birth year] (47 y.o. M) Treating RN: Ahmed Prima Primary Care Aunya Lemler: Gaetano Net Other Clinician: Referring Stanislaus Kaltenbach: Gaetano Net Treating Remi Lopata/Extender: Tito Dine in Treatment: 4 Edema Assessment Assessed: [Left: No] [Right: No] [Left: Edema] [Right: :] Calf Left: Right: Point of Measurement: 42 cm From Medial Instep 46.5 cm 50.6 cm Ankle Left: Right: Point of Measurement: 12 cm From Medial Instep 44.8 cm 37.1 cm Vascular Assessment Pulses: Dorsalis Pedis Palpable: [Left:No] [Right:No] Doppler Audible: [Left:Yes] [Right:Yes] Posterior Tibial Extremity colors, hair growth, and conditions: Extremity Color: [Left:Hyperpigmented] [Right:Hyperpigmented] Hair Growth on Extremity: [Left:Yes] [Right:Yes] Temperature of Extremity: [Left:Warm] [Right:Warm] Capillary Refill: [Left:< 3 seconds] [Right:< 3 seconds] Toe Nail Assessment Left: Right: Thick: Yes Yes Discolored: Yes Yes Deformed: Yes Yes Improper Length and Hygiene: Yes  Yes Electronic Signature(s) Signed: 01/27/2018 5:38:20 PM By: Alric Quan Entered By: Alric Quan on 01/25/2018 09:53:58 Belgrave, Johanan A. ([id number]) -------------------------------------------------------------------------------- Multi Wound Chart Details Patient Name: Perrot, Rudolpho A. Date of Service: 01/25/2018 9:30 AM Medical Record Number: [id number] Patient Account Number: [id number] Date of Birth/Sex: [birth year] (47 y.o. M) Treating RN: Roger Shelter Primary Care Levern Kalka: Gaetano Net Other Clinician: Referring Richardo Popoff: Gaetano Net Treating Jazmine Heckman/Extender: Tito Dine in Treatment: 4 Vital Signs Height(in): 76  Pulse(bpm): 64 Weight(lbs): 364.4 Blood Pressure(mmHg): 168/74 Body Mass Index(BMI): 44 Temperature(F): 98.2 Respiratory Rate 18 (breaths/min): Photos: [1:No Photos] [2:No Photos] [3:No Photos] Wound Location: [1:Left Lower Leg - Medial, Proximal] [2:Left Lower Leg - Lateral, Posterior] [3:Left, Medial, Anterior Lower Leg] Wounding Event: [1:Gradually Appeared] [2:Gradually Appeared] [3:Gradually Appeared] Primary Etiology: [1:Diabetic Wound/Ulcer of the Lower Extremity] [2:Diabetic Wound/Ulcer of the Lower Extremity] [3:Lymphedema] Secondary Etiology: [1:Lymphedema] [2:Lymphedema] [3:N/A] Comorbid History: [1:Hypertension, Type II Diabetes, Neuropathy] [2:Hypertension, Type II Diabetes, Neuropathy] [3:N/A] Date Acquired: [1:09/30/2017] [2:09/30/2017] [3:01/11/2018] Weeks of Treatment: [1:4] [2:4] [3:2] Wound Status: [1:Open] [2:Open] [3:Healed - Epithelialized] Measurements L x W x D [1:0.5x0.3x0.1] [2:3.6x7.3x0.7] [3:0x0x0] (cm) Area (cm) : [1:0.118] [2:20.64] [3:0] Volume (cm) : [1:0.012] [2:14.448] [3:0] % Reduction in Area: [1:97.10%] [2:27.00%] [3:100.00%] % Reduction in Volume: [1:99.50%] [2:-27.70%] [3:100.00%] Starting Position 1 [2:11] (o'clock): Ending Position 1 [2:1] (o'clock): Maximum Distance 1 (cm):  [2:0.8] Undermining: [1:No] [2:Yes] [3:N/A] Classification: [1:Grade 2] [2:Grade 2] [3:Partial Thickness] Exudate Amount: [1:Medium] [2:Large] [3:N/A] Exudate Type: [1:Serosanguineous] [2:Purulent] [3:N/A] Exudate Color: [1:red, brown] [2:yellow, brown, green] [3:N/A] Foul Odor After Cleansing: [1:Yes] [2:Yes] [3:N/A] Odor Anticipated Due to [1:No] [2:No] [3:N/A] Product Use: Wound Margin: [1:Distinct, outline attached] [2:Distinct, outline attached] [3:N/A] Granulation Amount: [1:Large (67-100%)] [2:Medium (34-66%)] [3:N/A] Granulation Quality: [1:Red] [2:Red, Pink] [3:N/A] Necrotic Amount: [1:Small (1-33%)] [2:Medium (34-66%)] [3:N/A] Necrotic Tissue: [1:Adherent Slough] [2:Adherent Slough] [3:N/A] Exposed Structures: [3:N/A] Fat Layer (Subcutaneous Fat Layer (Subcutaneous Tissue) Exposed: Yes Tissue) Exposed: Yes Fascia: No Fascia: No Tendon: No Tendon: No Muscle: No Muscle: No Joint: No Joint: No Bone: No Bone: No Epithelialization: None None N/A Periwound Skin Texture: Excoriation: No Excoriation: No No Abnormalities Noted Induration: No Induration: No Callus: No Callus: No Crepitus: No Crepitus: No Rash: No Rash: No Scarring: No Scarring: No Periwound Skin Moisture: Maceration: No Maceration: Yes No Abnormalities Noted Dry/Scaly: No Dry/Scaly: No Periwound Skin Color: Hemosiderin Staining: Yes Atrophie Blanche: No No Abnormalities Noted Atrophie Blanche: No Cyanosis: No Cyanosis: No Ecchymosis: No Ecchymosis: No Erythema: No Erythema: No Hemosiderin Staining: No Mottled: No Mottled: No Pallor: No Pallor: No Rubor: No Rubor: No Temperature: No Abnormality No Abnormality N/A Tenderness on Palpation: No Yes No Wound Preparation: Ulcer Cleansing: Ulcer Cleansing: N/A Rinsed/Irrigated with Saline, Rinsed/Irrigated with Saline, Other: soap and water Other: soap and water Topical Anesthetic Applied: Topical Anesthetic Applied: Other: lidocaine  4% Other: lidocaine 4% Wound Number: 4 N/A N/A Photos: No Photos N/A N/A Wound Location: Right Lower Leg - Anterior N/A N/A Wounding Event: Gradually Appeared N/A N/A Primary Etiology: Lymphedema N/A N/A Secondary Etiology: N/A N/A N/A Comorbid History: Hypertension, Type II N/A N/A Diabetes, Neuropathy Date Acquired: 01/25/2018 N/A N/A Weeks of Treatment: 0 N/A N/A Wound Status: Open N/A N/A Measurements L x W x D 0.2x0.3x0.1 N/A N/A (cm) Area (cm) : 0.047 N/A N/A Volume (cm) : 0.005 N/A N/A % Reduction in Area: N/A N/A N/A % Reduction in Volume: N/A N/A N/A Undermining: No N/A N/A Classification: Full Thickness Without N/A N/A Exposed Support Structures Exudate Amount: Large N/A N/A Exudate Type: N/A N/A N/A Exudate Color: N/A N/A N/A Foul Odor After Cleansing: N/A N/A N/A N/A N/A N/A Mendez, Isay A. ([id number]) Odor Anticipated Due to Product Use: Wound Margin: Distinct, outline attached N/A N/A Granulation Amount: Small (1-33%) N/A N/A Granulation Quality: Red N/A N/A Necrotic Amount: Large (67-100%) N/A N/A Necrotic Tissue: Eschar N/A N/A Exposed Structures: Fascia: No N/A N/A Fat Layer (Subcutaneous Tissue) Exposed: No Tendon: No Muscle: No Joint: No Bone: No Epithelialization:  None N/A N/A Periwound Skin Texture: No Abnormalities Noted N/A N/A Periwound Skin Moisture: No Abnormalities Noted N/A N/A Periwound Skin Color: No Abnormalities Noted N/A N/A Temperature: No Abnormality N/A N/A Tenderness on Palpation: Yes N/A N/A Wound Preparation: Ulcer Cleansing: N/A N/A Rinsed/Irrigated with Saline, Other: soap and water Topical Anesthetic Applied: Other: lidocaine 4% Treatment Notes Electronic Signature(s) Signed: 01/26/2018 8:24:56 AM By: Linton Ham MD Entered By: Linton Ham on 01/25/2018 10:27:07 Gobin, [name] ([id number]) -------------------------------------------------------------------------------- Multi-Disciplinary Care Plan  Details Patient Name: Farrington, Carlis A. Date of Service: 01/25/2018 9:30 AM Medical Record Number: [id number] Patient Account Number: [id number] Date of Birth/Sex: Jul 18, 1971 (47 y.o. M) Treating RN: Roger Shelter Primary Care Vishruth Seoane: Gaetano Net Other Clinician: Referring Patton Rabinovich: Gaetano Net Treating Bradie Sangiovanni/Extender: Tito Dine in Treatment: 4 Active Inactive ` Abuse / Safety / Falls / Self Care Management Nursing Diagnoses: Potential for falls Goals: Patient will remain injury free related to falls Date Initiated: 12/28/2017 Target Resolution Date: 04/08/2018 Goal Status: Active Interventions: Assess fall risk on admission and as needed Notes: ` Nutrition Nursing Diagnoses: Impaired glucose control: actual or potential Goals: Patient/caregiver verbalizes understanding of need to maintain therapeutic glucose control per primary care physician Date Initiated: 12/28/2017 Target Resolution Date: 04/08/2018 Goal Status: Active Interventions: Provide education on elevated blood sugars and impact on wound healing Notes: ` Orientation to the Wound Care Program Nursing Diagnoses: Knowledge deficit related to the wound healing center program Goals: Patient/caregiver will verbalize understanding of the [facility] Date Initiated: 12/28/2017 Target Resolution Date: 04/08/2018 Goal Status: Active Interventions: Mroczkowski, Larz A. ([id number]) Provide education on orientation to the wound center Notes: ` Wound/Skin Impairment Nursing Diagnoses: Impaired tissue integrity Goals: Ulcer/skin breakdown will heal within 14 weeks Date Initiated: 12/28/2017 Target Resolution Date: 04/08/2018 Goal Status: Active Interventions: Assess patient/caregiver ability to obtain necessary supplies Assess patient/caregiver ability to perform ulcer/skin care regimen upon admission and as needed Assess ulceration(s) every visit Notes: Electronic Signature(s) Signed:  01/25/2018 4:15:53 PM By: Roger Shelter Entered By: Roger Shelter on 01/25/2018 10:04:54 Welty, Eithen A. ([id number]) -------------------------------------------------------------------------------- Pain Assessment Details Patient Name: Newcombe, Koehn A. Date of Service: 01/25/2018 9:30 AM Medical Record Number: [id number] Patient Account Number: [id number] Date of Birth/Sex: 07/25/1971 (47 y.o. M) Treating RN: Ahmed Prima Primary Care Jibran Crookshanks: Gaetano Net Other Clinician: Referring Rakel Junio: Gaetano Net Treating Pruitt Taboada/Extender: Tito Dine in Treatment: 4 Active Problems Location of Pain Severity and Description of Pain Patient Has Paino No Site Locations Pain Management and Medication Current Pain Management: Electronic Signature(s) Signed: 01/27/2018 5:38:20 PM By: Alric Quan Entered By: Alric Quan on 01/25/2018 09:29:59 Herrle, Nohlan A. ([id number]) -------------------------------------------------------------------------------- Patient/Caregiver Education Details Patient Name: Bruins, Laray A. Date of Service: 01/25/2018 9:30 AM Medical Record Number: [id number] Patient Account Number: [id number] Date of Birth/Gender: August 04, 1971 (47 y.o. M) Treating RN: Montey Hora Primary Care Physician: Gaetano Net Other Clinician: Referring Physician: Gaetano Net Treating Physician/Extender: Tito Dine in Treatment: 4 Education Assessment Education Provided To: Patient Education Topics Provided Venous: Handouts: Other: need for compression Methods: Explain/Verbal Responses: State content correctly Electronic Signature(s) Signed: 01/25/2018 1:57:55 PM By: Montey Hora Entered By: Montey Hora on 01/25/2018 12:03:13 Veal, Leven A. ([id number]) -------------------------------------------------------------------------------- Wound Assessment Details Patient Name: Liberatore, Carsyn A. Date of Service: 01/25/2018 9:30 AM Medical Record  Number: [id number] Patient Account Number: [id number] Date of Birth/Sex: 1971-08-17 (47 y.o. M) Treating RN: Ahmed Prima Primary Care Elye Harmsen: Gaetano Net Other Clinician: Referring Lashane Whelpley: Gaetano Net Treating Lewanda Perea/Extender: Tito Dine  in Treatment: 4 Wound Status Wound Number: 1 Primary Etiology: Diabetic Wound/Ulcer of the Lower Extremity Wound Location: Left Lower Leg - Medial, Proximal Secondary Lymphedema Wounding Event: Gradually Appeared Etiology: Date Acquired: 09/30/2017 Wound Status: Open Weeks Of Treatment: 4 Comorbid History: Hypertension, Type II Diabetes, Clustered Wound: No Neuropathy Photos Photo Uploaded By: Alric Quan on 01/26/2018 08:16:04 Wound Measurements Length: (cm) 0.5 Width: (cm) 0.3 Depth: (cm) 0.1 Area: (cm) 0.118 Volume: (cm) 0.012 % Reduction in Area: 97.1% % Reduction in Volume: 99.5% Epithelialization: None Tunneling: No Undermining: No Wound Description Classification: Grade 2 Wound Margin: Distinct, outline attached Exudate Amount: Medium Exudate Type: Serosanguineous Exudate Color: red, brown Foul Odor After Cleansing: Yes Due to Product Use: No Slough/Fibrino Yes Wound Bed Granulation Amount: Large (67-100%) Exposed Structure Granulation Quality: Red Fascia Exposed: No Necrotic Amount: Small (1-33%) Fat Layer (Subcutaneous Tissue) Exposed: Yes Necrotic Quality: Adherent Slough Tendon Exposed: No Muscle Exposed: No Joint Exposed: No Bone Exposed: No Periwound Skin Texture Texture Color No Abnormalities Noted: No No Abnormalities Noted: No Senat, Barack A. ([id number]) Callus: No Atrophie Blanche: No Crepitus: No Cyanosis: No Excoriation: No Ecchymosis: No Induration: No Erythema: No Rash: No Hemosiderin Staining: Yes Scarring: No Mottled: No Pallor: No Moisture Rubor: No No Abnormalities Noted: No Dry / Scaly: No Temperature / Pain Maceration: No Temperature: No  Abnormality Wound Preparation Ulcer Cleansing: Rinsed/Irrigated with Saline, Other: soap and water, Topical Anesthetic Applied: Other: lidocaine 4%, Treatment Notes Wound #1 (Left, Proximal, Medial Lower Leg) 1. Cleansed with: Clean wound with Normal Saline Cleanse wound with antibacterial soap and water 2. Anesthetic Topical Lidocaine 4% cream to wound bed prior to debridement 3. Peri-wound Care: Moisturizing lotion 4. Dressing Applied: Other dressing (specify in notes) 5. Secondary Dressing Applied ABD Pad 7. Secured with 4 Layer Compression System - Bilateral Notes silvercell Electronic Signature(s) Signed: 01/27/2018 5:38:20 PM By: Alric Quan Entered By: Alric Quan on 01/25/2018 09:54:30 Mcburney, Boyde A. ([id number]) -------------------------------------------------------------------------------- Wound Assessment Details Patient Name: Ambs, Rigoberto A. Date of Service: 01/25/2018 9:30 AM Medical Record Number: [id number] Patient Account Number: [id number] Date of Birth/Sex: 08-27-1971 (47 y.o. M) Treating RN: Ahmed Prima Primary Care Solymar Grace: Gaetano Net Other Clinician: Referring Arrick Dutton: Gaetano Net Treating Michell Kader/Extender: Tito Dine in Treatment: 4 Wound Status Wound Number: 2 Primary Etiology: Diabetic Wound/Ulcer of the Lower Extremity Wound Location: Left Lower Leg - Lateral, Posterior Secondary Lymphedema Wounding Event: Gradually Appeared Etiology: Date Acquired: 09/30/2017 Wound Status: Open Weeks Of Treatment: 4 Comorbid History: Hypertension, Type II Diabetes, Clustered Wound: No Neuropathy Photos Photo Uploaded By: Alric Quan on 01/26/2018 08:16:47 Wound Measurements Length: (cm) 3.6 Width: (cm) 7.3 Depth: (cm) 0.7 Area: (cm) 20.64 Volume: (cm) 14.448 % Reduction in Area: 27% % Reduction in Volume: -27.7% Epithelialization: None Tunneling: No Undermining: Yes Starting Position (o'clock): 11 Ending  Position (o'clock): 1 Maximum Distance: (cm) 0.8 Wound Description Classification: Grade 2 Wound Margin: Distinct, outline attached Exudate Amount: Large Exudate Type: Purulent Exudate Color: yellow, brown, green Foul Odor After Cleansing: Yes Due to Product Use: No Slough/Fibrino Yes Wound Bed Granulation Amount: Medium (34-66%) Exposed Structure Granulation Quality: Red, Pink Fascia Exposed: No Necrotic Amount: Medium (34-66%) Fat Layer (Subcutaneous Tissue) Exposed: Yes Necrotic Quality: Adherent Slough Tendon Exposed: No Muscle Exposed: No Joint Exposed: No Bone Exposed: No Hard, Rustyn A. ([id number]) Periwound Skin Texture Texture Color No Abnormalities Noted: No No Abnormalities Noted: No Callus: No Atrophie Blanche: No Crepitus: No Cyanosis: No Excoriation: No Ecchymosis: No Induration: No Erythema: No Rash: No  Hemosiderin Staining: No Scarring: No Mottled: No Pallor: No Moisture Rubor: No No Abnormalities Noted: No Dry / Scaly: No Temperature / Pain Maceration: Yes Temperature: No Abnormality Tenderness on Palpation: Yes Wound Preparation Ulcer Cleansing: Rinsed/Irrigated with Saline, Other: soap and water, Topical Anesthetic Applied: Other: lidocaine 4%, Treatment Notes Wound #2 (Left, Lateral, Posterior Lower Leg) 1. Cleansed with: Clean wound with Normal Saline Cleanse wound with antibacterial soap and water 2. Anesthetic Topical Lidocaine 4% cream to wound bed prior to debridement 3. Peri-wound Care: Moisturizing lotion 4. Dressing Applied: Other dressing (specify in notes) 5. Secondary Dressing Applied ABD Pad 7. Secured with 4 Layer Compression System - Bilateral Notes silvercell Electronic Signature(s) Signed: 01/27/2018 5:38:20 PM By: Alric Quan Entered By: Alric Quan on 01/25/2018 09:55:07 Voorhis, Joren A. ([id number]) -------------------------------------------------------------------------------- Wound Assessment  Details Patient Name: Walraven, Jorden A. Date of Service: 01/25/2018 9:30 AM Medical Record Number: [id number] Patient Account Number: [id number] Date of Birth/Sex: 1971/04/01 (47 y.o. M) Treating RN: Ahmed Prima Primary Care Marquavious Nazar: Gaetano Net Other Clinician: Referring Geraldin Habermehl: Gaetano Net Treating Annet Manukyan/Extender: Tito Dine in Treatment: 4 Wound Status Wound Number: 3 Primary Etiology: Lymphedema Wound Location: Left, Medial, Anterior Lower Leg Wound Status: Healed - Epithelialized Wounding Event: Gradually Appeared Date Acquired: 01/11/2018 Weeks Of Treatment: 2 Clustered Wound: No Photos Photo Uploaded By: Alric Quan on 01/26/2018 08:17:24 Wound Measurements Length: (cm) 0 Width: (cm) 0 Depth: (cm) 0 Area: (cm) 0 Volume: (cm) 0 % Reduction in Area: 100% % Reduction in Volume: 100% Wound Description Classification: Partial Thickness Periwound Skin Texture Texture Color No Abnormalities Noted: No No Abnormalities Noted: No Moisture No Abnormalities Noted: No Electronic Signature(s) Signed: 01/27/2018 5:38:20 PM By: Alric Quan Entered By: Alric Quan on 01/25/2018 09:48:49 Fomby, Anzel A. ([id number]) -------------------------------------------------------------------------------- Wound Assessment Details Patient Name: Mandley, Luisfernando A. Date of Service: 01/25/2018 9:30 AM Medical Record Number: [id number] Patient Account Number: [id number] Date of Birth/Sex: [birth year] (47 y.o. M) Treating RN: Ahmed Prima Primary Care Tesla Keeler: Gaetano Net Other Clinician: Referring Orey Moure: Gaetano Net Treating Landry Lookingbill/Extender: Tito Dine in Treatment: 4 Wound Status Wound Number: 4 Primary Etiology: Lymphedema Wound Location: Right Lower Leg - Anterior Wound Status: Open Wounding Event: Gradually Appeared Comorbid Hypertension, Type II Diabetes, History: Neuropathy Date Acquired: 01/25/2018 Weeks Of Treatment:  0 Clustered Wound: No Photos Photo Uploaded By: Alric Quan on 01/26/2018 08:17:49 Wound Measurements Length: (cm) 0.2 Width: (cm) 0.3 Depth: (cm) 0.1 Area: (cm) 0.047 Volume: (cm) 0.005 % Reduction in Area: % Reduction in Volume: Epithelialization: None Tunneling: No Undermining: No Wound Description Full Thickness Without Exposed Support Classification: Structures Wound Margin: Distinct, outline attached Exudate Large Amount: Slough/Fibrino Yes Wound Bed Granulation Amount: Small (1-33%) Exposed Structure Granulation Quality: Red Fascia Exposed: No Necrotic Amount: Large (67-100%) Fat Layer (Subcutaneous Tissue) Exposed: No Necrotic Quality: Eschar Tendon Exposed: No Muscle Exposed: No Joint Exposed: No Bone Exposed: No Periwound Skin Texture Texture Color No Abnormalities Noted: No No Abnormalities Noted: No Moisture Temperature / Pain Andujar, Haroon A. ([id number]) No Abnormalities Noted: No Temperature: No Abnormality Tenderness on Palpation: Yes Wound Preparation Ulcer Cleansing: Rinsed/Irrigated with Saline, Other: soap and water, Topical Anesthetic Applied: Other: lidocaine 4%, Treatment Notes Wound #4 (Right, Anterior Lower Leg) 1. Cleansed with: Clean wound with Normal Saline Cleanse wound with antibacterial soap and water 2. Anesthetic Topical Lidocaine 4% cream to wound bed prior to debridement 3. Peri-wound Care: Moisturizing lotion 4. Dressing Applied: Other dressing (specify in notes) 5. Secondary Dressing Applied ABD Pad 7. Secured  with 4 Layer Compression System - Bilateral Notes [employer] Electronic Signature(s) Signed: 01/27/2018 5:38:20 PM By: Alric Quan Entered By: Alric Quan on 01/25/2018 09:41:41 Woolever, Charbel A. ([id number]) -------------------------------------------------------------------------------- Vitals Details Patient Name: Weist, Jabarri A. Date of Service: 01/25/2018 9:30 AM Medical Record  Number: [id number] Patient Account Number: [id number] Date of Birth/Sex: 27-Sep-1970 (47 y.o. M) Treating RN: Ahmed Prima Primary Care Demetres Prochnow: Gaetano Net Other Clinician: Referring Dariana Garbett: Gaetano Net Treating Nathaneil Feagans/Extender: Tito Dine in Treatment: 4 Vital Signs Time Taken: 09:32 Temperature (F): 98.2 Height (in): 76 Pulse (bpm): 64 Weight (lbs): 364.4 Respiratory Rate (breaths/min): 18 Body Mass Index (BMI): 44.4 Blood Pressure (mmHg): 168/74 Reference Range: 80 - 120 mg / dl Electronic Signature(s) Signed: 01/27/2018 5:38:20 PM By: Alric Quan Entered By: Alric Quan on 01/25/2018 09:33:04

## 2018-02-01 ENCOUNTER — Other Ambulatory Visit
Admission: RE | Admit: 2018-02-01 | Discharge: 2018-02-01 | Disposition: A | Discharge status: Home / Self Care | Payer: BLUE CROSS/BLUE SHIELD | Source: Other Acute Inpatient Hospital | Attending: Internal Medicine | Admitting: Internal Medicine

## 2018-02-01 ENCOUNTER — Encounter: Payer: BLUE CROSS/BLUE SHIELD | Attending: Internal Medicine | Admitting: Internal Medicine

## 2018-02-01 DIAGNOSIS — Z881 Allergy status to other antibiotic agents status: Secondary | ICD-10-CM | POA: Insufficient documentation

## 2018-02-01 DIAGNOSIS — Z8619 Personal history of other infectious and parasitic diseases: Secondary | ICD-10-CM | POA: Insufficient documentation

## 2018-02-01 DIAGNOSIS — L97211 Non-pressure chronic ulcer of right calf limited to breakdown of skin: Secondary | ICD-10-CM | POA: Diagnosis not present

## 2018-02-01 DIAGNOSIS — I10 Essential (primary) hypertension: Secondary | ICD-10-CM | POA: Insufficient documentation

## 2018-02-01 DIAGNOSIS — E1140 Type 2 diabetes mellitus with diabetic neuropathy, unspecified: Secondary | ICD-10-CM | POA: Insufficient documentation

## 2018-02-01 DIAGNOSIS — L97223 Non-pressure chronic ulcer of left calf with necrosis of muscle: Secondary | ICD-10-CM | POA: Diagnosis not present

## 2018-02-01 DIAGNOSIS — I890 Lymphedema, not elsewhere classified: Secondary | ICD-10-CM | POA: Diagnosis not present

## 2018-02-01 DIAGNOSIS — E11622 Type 2 diabetes mellitus with other skin ulcer: Secondary | ICD-10-CM | POA: Diagnosis present

## 2018-02-01 DIAGNOSIS — B999 Unspecified infectious disease: Secondary | ICD-10-CM | POA: Diagnosis not present

## 2018-02-03 NOTE — Progress Notes (Signed)
[name], [name] ([id number]) Visit Report for 02/01/2018 Arrival Information Details Patient Name: [name] Date of Service: 02/01/2018 9:15 AM Medical Record Number: [id number] Patient Account Number: [id number] Date of Birth/Sex: February 19, 1971 (47 y.o. Male) Treating [name]: [name] Primary Care [name]: [name] Other Clinician: Referring [name]: [name] Treating [name]: [name] in Treatment: 5 Visit Information History Since Last Visit All ordered tests and consults were completed: No Patient Arrived: Ambulatory Added or deleted any medications: No Arrival Time: 09:36 Any new allergies or adverse reactions: No Accompanied By: self Had [name] fall or experienced change in No Transfer Assistance: None activities of daily living that may affect Patient Identification Verified: Yes risk of falls: Secondary Verification Process Yes Signs or symptoms of abuse/neglect since last visito No Completed: Hospitalized since last visit: No Patient Requires Transmission-Based No Implantable device outside of the clinic excluding No Precautions: cellular tissue based products placed in the center Patient Has Alerts: Yes since last visit: Patient Alerts: DM II Has Dressing in Place as Prescribed: Yes noncompressible Has Compression in Place as Prescribed: Yes bilateral Pain Present Now: No Electronic Signature(s) Signed: 02/02/2018 2:43:37 PM By: [name] Entered By: [name] on 02/01/2018 09:38:28 [name] ([id number]) -------------------------------------------------------------------------------- Compression Therapy Details Patient Name: [name] Date of Service: 02/01/2018 9:15 AM Medical Record Number: [id number] Patient Account Number: [id number] Date of Birth/Sex: 13-May-1971 (47 y.o. Male) Treating [name]: [name] Primary Care [name]: [name] Other Clinician: Referring Amed Datta: [name] Treating  Lisa-Marie Rueger/Extender: [name] in Treatment: 5 Compression Therapy Performed for Wound Assessment: Wound #1 Left,Proximal,Medial Lower Leg Performed By: Clinician [name] Compression Type: Four Layer Post Procedure Diagnosis Same as Pre-procedure Electronic Signature(s) Signed: 02/02/2018 2:15:42 PM By: [name] Cool, BSN, [name], CWS, Kim [name], BSN Entered By: [name] on 02/01/2018 10:17:58 [name] ([id number]) -------------------------------------------------------------------------------- Encounter Discharge Information Details Patient Name: [name] Date of Service: 02/01/2018 9:15 AM Medical Record Number: [id number] Patient Account Number: [id number] Date of Birth/Sex: December 06, 1970 (47 y.o. Male) Treating [name]: [name] Hora Primary Care Jyllian Haynie: [name] Other Clinician: Referring Aking Klabunde: [name] Treating [name]: [name] in Treatment: 5 Encounter Discharge Information Items Discharge Condition: Stable Ambulatory Status: Ambulatory Discharge Destination: Home Transportation: Private Auto Accompanied By: self Schedule Follow-up Appointment: Yes Clinical Summary of Care: Electronic Signature(s) Signed: 02/01/2018 12:57:22 PM By: [name] Hora Entered By: [name] Hora on 02/01/2018 12:57:21 [name] ([id number]) -------------------------------------------------------------------------------- Lower Extremity Assessment Details Patient Name: [name] Date of Service: 02/01/2018 9:15 AM Medical Record Number: [id number] Patient Account Number: [id number] Date of Birth/Sex: September 24, 1970 (47 y.o. Male) Treating [name]: [name] Primary Care Gilliam Hawkes: [name] Other Clinician: Referring Kaymen Adrian: [name] Treating [name]: [name] in Treatment: 5 Edema Assessment Assessed: [Left: No] [Right: No] [Left: Edema] [Right: :] Calf Left: Right: Point of  Measurement: 42 cm From Medial Instep 54.2 cm 46.3 cm Ankle Left: Right: Point of Measurement: 12 cm From Medial Instep 44.3 cm 35.4 cm Vascular Assessment Pulses: Dorsalis Pedis Palpable: [Left:Yes] [Right:Yes] Posterior Tibial Extremity colors, hair growth, and conditions: Extremity Color: [Left:Hyperpigmented] [Right:Red] Hair Growth on Extremity: [Left:Yes] [Right:Yes] Temperature of Extremity: [Left:Warm] [Right:Warm] Capillary Refill: [Left:< 3 seconds] [Right:< 3 seconds] Toe Nail Assessment Left: Right: Thick: Yes Yes Discolored: Yes Yes Deformed: No No Improper Length and Hygiene: Yes Yes Notes heel to knee-53cm bilaterally Electronic Signature(s) Signed: 02/02/2018 2:43:37 PM By: [name] Entered By: [name] on  02/01/2018 09:56:42 [name] ([id number]) -------------------------------------------------------------------------------- Multi Wound Chart Details Patient Name: [name] Date of Service: 02/01/2018 9:15 AM Medical Record Number: [id number] Patient Account Number: [id number] Date of Birth/Sex: [birth year] (47 y.o. Male) Treating [name]: [name] Primary Care Nezar Buckles: [name] Other Clinician: Referring Carmeline Kowal: [name] Treating [name]: [name] in Treatment: 5 Vital Signs Height(in): 76 Pulse(bpm): 56 Weight(lbs): 364.4 Blood Pressure(mmHg): 157/70 Body Mass Index(BMI): 44 Temperature(F): 98.3 Respiratory Rate 20 (breaths/min): Photos: [1:No Photos] [2:No Photos] [4:No Photos] Wound Location: [1:Left Lower Leg - Medial, Proximal] [2:Left Lower Leg - Lateral, Posterior] [4:Right Lower Leg - Anterior] Wounding Event: [1:Gradually Appeared] [2:Gradually Appeared] [4:Gradually Appeared] Primary Etiology: [1:Diabetic Wound/Ulcer of the Lower Extremity] [2:Diabetic Wound/Ulcer of the Lower Extremity] [4:Lymphedema] Secondary Etiology: [1:Lymphedema] [2:Lymphedema] [4:N/[name]] Comorbid History:  [1:Hypertension, Type II Diabetes, Neuropathy] [2:Hypertension, Type II Diabetes, Neuropathy] [4:Hypertension, Type II Diabetes, Neuropathy] Date Acquired: [1:09/30/2017] [2:09/30/2017] [4:01/25/2018] Weeks of Treatment: [1:5] [2:5] [4:1] Wound Status: [1:Open] [2:Open] [4:Open] Measurements L x W x D [1:0.5x0.7x0.1] [2:3.2x7x0.6] [4:2.3x1x0.1] (cm) Area (cm) : [1:0.275] [2:17.593] [4:1.806] Volume (cm) : [1:0.027] [2:10.556] [4:0.181] % Reduction in Area: [1:93.20%] [2:37.80%] [4:-3742.60%] % Reduction in Volume: [1:98.90%] [2:6.70%] [4:-3520.00%] Classification: [1:Grade 2] [2:Grade 2] [4:Full Thickness Without Exposed Support Structures] Exudate Amount: [1:Small] [2:Large] [4:Large] Exudate Type: [1:Serosanguineous] [2:Purulent] [4:Purulent] Exudate Color: [1:red, brown] [2:yellow, brown, green] [4:yellow, brown, green] Foul Odor After Cleansing: [1:Yes] [2:Yes] [4:N/[name]] Odor Anticipated Due to [1:No] [2:No] [4:N/[name]] Product Use: Wound Margin: [1:Distinct, outline attached] [2:Distinct, outline attached] [4:Distinct, outline attached] Granulation Amount: [1:None Present (0%)] [2:Medium (34-66%)] [4:Medium (34-66%)] Granulation Quality: [1:N/[name]] [2:Red, Pink] [4:Red] Necrotic Amount: [1:Large (67-100%)] [2:Medium (34-66%)] [4:Medium (34-66%)] Necrotic Tissue: [1:Eschar] [2:Adherent Slough] [4:Adherent Slough] Exposed Structures: [1:Fat Layer (Subcutaneous Tissue) Exposed: Yes Fascia: No Tendon: No Muscle: No] [2:Fat Layer (Subcutaneous Tissue) Exposed: Yes Fascia: No Tendon: No Muscle: No] [4:Fascia: No Fat Layer (Subcutaneous Tissue) Exposed: No Tendon: No Muscle: No] Joint: No Joint: No Joint: No Bone: No Bone: No Bone: No Epithelialization: None None None Periwound Skin Texture: Excoriation: No Excoriation: No No Abnormalities Noted Induration: No Induration: No Callus: No Callus: No Crepitus: No Crepitus: No Rash: No Rash: No Scarring: No Scarring: No Periwound Skin  Moisture: Maceration: No Maceration: Yes No Abnormalities Noted Dry/Scaly: No Dry/Scaly: No Periwound Skin Color: Hemosiderin Staining: Yes Atrophie Blanche: No No Abnormalities Noted Atrophie Blanche: No Cyanosis: No Cyanosis: No Ecchymosis: No Ecchymosis: No Erythema: No Erythema: No Hemosiderin Staining: No Mottled: No Mottled: No Pallor: No Pallor: No Rubor: No Rubor: No Temperature: No Abnormality No Abnormality No Abnormality Tenderness on Palpation: No Yes Yes Wound Preparation: Ulcer Cleansing: Ulcer Cleansing: Ulcer Cleansing: Rinsed/Irrigated with Saline, Rinsed/Irrigated with Saline, Rinsed/Irrigated with Saline, Other: soap and water Other: soap and water Other: soap and water Topical Anesthetic Applied: Topical Anesthetic Applied: Topical Anesthetic Applied: Other: lidocaine 4% Other: lidocaine 4% Other: lidocaine 4% Procedures Performed: Compression Therapy N/[name] N/[name] Wound Number: 5 N/[name] N/[name] Photos: No Photos N/[name] N/[name] Wound Location: Right Lower Leg - Medial N/[name] N/[name] Wounding Event: Blister N/[name] N/[name] Primary Etiology: Lymphedema N/[name] N/[name] Secondary Etiology: Diabetic Wound/Ulcer of the N/[name] N/[name] Lower Extremity Comorbid History: Hypertension, Type II N/[name] N/[name] Diabetes, Neuropathy Date Acquired: 02/01/2018 N/[name] N/[name] Weeks of Treatment: 0 N/[name] N/[name] Wound Status: Open N/[name] N/[name] Measurements L x W x D 2.5x5.5x0.1 N/[name] N/[name] (cm) Area (cm) : 10.799 N/[name] N/[name] Volume (cm) : 1.08 N/[name] N/[name] % Reduction in Area: N/[name] N/[name] N/[name] % Reduction in Volume: N/[name] N/[name]  N/[name] Classification: Full Thickness Without N/[name] N/[name] Exposed Support Structures Exudate Amount: Large N/[name] N/[name] Exudate Type: Serous N/[name] N/[name] Exudate Color: amber N/[name] N/[name] Foul Odor After Cleansing: No N/[name] N/[name] Odor Anticipated Due to N/[name] N/[name] N/[name] Product Use: Wound Margin: Flat and Intact N/[name] N/[name] Granulation Amount: None Present (0%) N/[name] N/[name] Cohoon, Aizen [name] ([id number]) Granulation Quality: N/[name] N/[name] N/[name] Necrotic Amount: Large  (67-100%) N/[name] N/[name] Necrotic Tissue: Eschar N/[name] N/[name] Exposed Structures: Fascia: No N/[name] N/[name] Fat Layer (Subcutaneous Tissue) Exposed: No Tendon: No Muscle: No Joint: No Bone: No Epithelialization: None N/[name] N/[name] Periwound Skin Texture: No Abnormalities Noted N/[name] N/[name] Periwound Skin Moisture: No Abnormalities Noted N/[name] N/[name] Periwound Skin Color: No Abnormalities Noted N/[name] N/[name] Temperature: No Abnormality N/[name] N/[name] Tenderness on Palpation: Yes N/[name] N/[name] Wound Preparation: Ulcer Cleansing: N/[name] N/[name] Rinsed/Irrigated with Saline, Other: soap and water Topical Anesthetic Applied: Other: lidocaine 4% Procedures Performed: N/[name] N/[name] N/[name] Treatment Notes Electronic Signature(s) Signed: 02/01/2018 6:13:14 PM By: Linton Ham MD Entered By: Linton Ham on 02/01/2018 10:57:07 Fujiwara, Chaka AMarland Kitchen ([id number]) -------------------------------------------------------------------------------- Multi-Disciplinary Care Plan Details Patient Name: [name] Date of Service: 02/01/2018 9:15 AM Medical Record Number: [id number] Patient Account Number: [id number] Date of Birth/Sex: 12-14-1970 (47 y.o. Male) Treating [name]: [name] Primary Care Marin Wisner: [name] Other Clinician: Referring Geni Skorupski: [name] Treating Denetta Fei/Extender: [name] in Treatment: 5 Active Inactive ` Abuse / Safety / Falls / Self Care Management Nursing Diagnoses: Potential for falls Goals: Patient will remain injury free related to falls Date Initiated: 12/28/2017 Target Resolution Date: 04/08/2018 Goal Status: Active Interventions: Assess fall risk on admission and as needed Notes: ` Nutrition Nursing Diagnoses: Impaired glucose control: actual or potential Goals: Patient/caregiver verbalizes understanding of need to maintain therapeutic glucose control per primary care physician Date Initiated: 12/28/2017 Target Resolution Date: 04/08/2018 Goal Status: Active Interventions: Provide education on  elevated blood sugars and impact on wound healing Notes: ` Orientation to the Wound Care Program Nursing Diagnoses: Knowledge deficit related to the wound healing center program Goals: Patient/caregiver will verbalize understanding of the [place] Program Date Initiated: 12/28/2017 Target Resolution Date: 04/08/2018 Goal Status: Active Interventions: Sara, Kinta [name] ([id number]) Provide education on orientation to the wound center Notes: ` Wound/Skin Impairment Nursing Diagnoses: Impaired tissue integrity Goals: Ulcer/skin breakdown will heal within 14 weeks Date Initiated: 12/28/2017 Target Resolution Date: 04/08/2018 Goal Status: Active Interventions: Assess patient/caregiver ability to obtain necessary supplies Assess patient/caregiver ability to perform ulcer/skin care regimen upon admission and as needed Assess ulceration(s) every visit Notes: Electronic Signature(s) Signed: 02/02/2018 2:15:42 PM By: [name] Cool, BSN, [name], CWS, Kim [name], BSN Entered By: [name] on 02/01/2018 10:11:56 [name] ([id number]) -------------------------------------------------------------------------------- Pain Assessment Details Patient Name: [name] Date of Service: 02/01/2018 9:15 AM Medical Record Number: [id number] Patient Account Number: [id number] Date of Birth/Sex: [birth year] (47 y.o. Male) Treating [name]: Ahmed Prima Primary Care Gonsalo Cuthbertson: [name] Other Clinician: Referring Jamael Hoffmann: [name] Treating Dali Kraner/Extender: [name] in Treatment: 5 Active Problems Location of Pain Severity and Description of Pain Patient Has Paino No Site Locations Pain Management and Medication Current Pain Management: Electronic Signature(s) Signed: 02/02/2018 2:43:37 PM By: [name] Entered By: [name] on 02/01/2018 09:38:40 Jamroz, Leray AMarland Kitchen  ([id number]) -------------------------------------------------------------------------------- Patient/Caregiver Education Details Patient Name: [name] Date of Service: 02/01/2018 9:15 AM Medical Record Number: [id number] Patient Account Number: [id number] Date of Birth/Gender: 1971-04-15 (47 y.o. Male) Treating [name]: [name] Hora Primary Care Physician:  [name] Other Clinician: Referring Physician: Gaetano [name] Treating Physician/Extender: [name] in Treatment: 5 Education Assessment Education Provided To: Patient Education Topics Provided Venous: Handouts: Other: leg elevation Methods: Explain/Verbal Responses: State content correctly Electronic Signature(s) Signed: 02/01/2018 5:11:38 PM By: [name] Hora Entered By: [name] Hora on 02/01/2018 12:57:41 [name] ([id number]) -------------------------------------------------------------------------------- Wound Assessment Details Patient Name: [name] Date of Service: 02/01/2018 9:15 AM Medical Record Number: [id number] Patient Account Number: [id number] Date of Birth/Sex: 05-30-1971 (47 y.o. Male) Treating [name]: Ahmed Prima Primary Care Ryah Cribb: [name] Other Clinician: Referring Darlene Bartelt: [name] Treating Donavin Audino/Extender: [name] in Treatment: 5 Wound Status Wound Number: 1 Primary Etiology: Diabetic Wound/Ulcer of the Lower Extremity Wound Location: Left Lower Leg - Medial, Proximal Secondary Lymphedema Wounding Event: Gradually Appeared Etiology: Date Acquired: 09/30/2017 Wound Status: Open Weeks Of Treatment: 5 Comorbid History: Hypertension, Type II Diabetes, Clustered Wound: No Neuropathy Photos Photo Uploaded By: [name] on 02/02/2018 08:07:19 Wound Measurements Length: (cm) 0.5 Width: (cm) 0.7 Depth: (cm) 0.1 Area: (cm) 0.275 Volume: (cm) 0.027 % Reduction in Area: 93.2% % Reduction in Volume: 98.9% Epithelialization:  None Tunneling: No Undermining: No Wound Description Classification: Grade 2 Wound Margin: Distinct, outline attached Exudate Amount: Small Exudate Type: Serosanguineous Exudate Color: red, brown Foul Odor After Cleansing: Yes Due to Product Use: No Slough/Fibrino Yes Wound Bed Granulation Amount: None Present (0%) Exposed Structure Necrotic Amount: Large (67-100%) Fascia Exposed: No Necrotic Quality: Eschar Fat Layer (Subcutaneous Tissue) Exposed: Yes Tendon Exposed: No Muscle Exposed: No Joint Exposed: No Bone Exposed: No Periwound Skin Texture Texture Color No Abnormalities Noted: No No Abnormalities Noted: No Koker, Yoni [name] ([id number]) Callus: No Atrophie Blanche: No Crepitus: No Cyanosis: No Excoriation: No Ecchymosis: No Induration: No Erythema: No Rash: No Hemosiderin Staining: Yes Scarring: No Mottled: No Pallor: No Moisture Rubor: No No Abnormalities Noted: No Dry / Scaly: No Temperature / Pain Maceration: No Temperature: No Abnormality Wound Preparation Ulcer Cleansing: Rinsed/Irrigated with Saline, Other: soap and water, Topical Anesthetic Applied: Other: lidocaine 4%, Treatment Notes Wound #1 (Left, Proximal, Medial Lower Leg) 1. Cleansed with: Clean wound with Normal Saline Cleanse wound with antibacterial soap and water 2. Anesthetic Topical Lidocaine 4% cream to wound bed prior to debridement 3. Peri-wound Care: Moisturizing lotion 4. Dressing Applied: Other dressing (specify in notes) 5. Secondary Dressing Applied ABD Pad 7. Secured with 4 Layer Compression System - Bilateral Notes silvercell Electronic Signature(s) Signed: 02/02/2018 2:43:37 PM By: [name] Entered By: [name] on 02/01/2018 09:52:35 Garnett, Kagan [name] ([id number]) -------------------------------------------------------------------------------- Wound Assessment Details Patient Name: [name] Date of Service: 02/01/2018 9:15 AM Medical Record  Number: [id number] Patient Account Number: [id number] Date of Birth/Sex: 11/17/1970 (47 y.o. Male) Treating [name]: Ahmed Prima Primary Care Kennadie Brenner: [name] Other Clinician: Referring Tymber Stallings: [name] Treating Earnest Mcgillis/Extender: Ricard Dillon Weeks in Treatment: 5 Wound Status Wound Number: 2 Primary Etiology: Diabetic Wound/Ulcer of the Lower Extremity Wound Location: Left Lower Leg - Lateral, Posterior Secondary Lymphedema Wounding Event: Gradually Appeared Etiology: Date Acquired: 09/30/2017 Wound Status: Open Weeks Of Treatment: 5 Comorbid History: Hypertension, Type II Diabetes, Clustered Wound: No Neuropathy Photos Photo Uploaded By: [name] on 02/02/2018 08:07:19 Wound Measurements Length: (cm) 3.2 Width: (cm) 7 Depth: (cm) 0.6 Area: (cm) 17.593 Volume: (cm) 10.556 % Reduction in Area: 37.8% % Reduction in Volume: 6.7% Epithelialization: None Tunneling: No Undermining: No Wound Description Classification: Grade 2 Wound Margin: Distinct, outline attached Exudate Amount: Large Exudate Type: Purulent Exudate  Color: yellow, brown, green Foul Odor After Cleansing: Yes Due to Product Use: No Slough/Fibrino Yes Wound Bed Granulation Amount: Medium (34-66%) Exposed Structure Granulation Quality: Red, Pink Fascia Exposed: No Necrotic Amount: Medium (34-66%) Fat Layer (Subcutaneous Tissue) Exposed: Yes Necrotic Quality: Adherent Slough Tendon Exposed: No Muscle Exposed: No Joint Exposed: No Bone Exposed: No Periwound Skin Texture Texture Color No Abnormalities Noted: No No Abnormalities Noted: No Wares, Artrell [name] ([id number]) Callus: No Atrophie Blanche: No Crepitus: No Cyanosis: No Excoriation: No Ecchymosis: No Induration: No Erythema: No Rash: No Hemosiderin Staining: No Scarring: No Mottled: No Pallor: No Moisture Rubor: No No Abnormalities Noted: No Dry / Scaly: No Temperature / Pain Maceration: Yes Temperature:  No Abnormality Tenderness on Palpation: Yes Wound Preparation Ulcer Cleansing: Rinsed/Irrigated with Saline, Other: soap and water, Topical Anesthetic Applied: Other: lidocaine 4%, Treatment Notes Wound #2 (Left, Lateral, Posterior Lower Leg) 1. Cleansed with: Clean wound with Normal Saline Cleanse wound with antibacterial soap and water 2. Anesthetic Topical Lidocaine 4% cream to wound bed prior to debridement 3. Peri-wound Care: Moisturizing lotion 4. Dressing Applied: Other dressing (specify in notes) 5. Secondary Dressing Applied ABD Pad 7. Secured with 4 Layer Compression System - Bilateral Notes silvercell Electronic Signature(s) Signed: 02/02/2018 2:43:37 PM By: [name] Entered By: [name] on 02/01/2018 09:51:54 Cottman, Royden [name] ([id number]) -------------------------------------------------------------------------------- Wound Assessment Details Patient Name: [name] Date of Service: 02/01/2018 9:15 AM Medical Record Number: [id number] Patient Account Number: [id number] Date of Birth/Sex: Oct 06, 1970 (47 y.o. Male) Treating [name]: Ahmed Prima Primary Care Raylinn Kosar: [name] Other Clinician: Referring Elika Godar: [name] Treating Ladiamond Gallina/Extender: Ricard Dillon Weeks in Treatment: 5 Wound Status Wound Number: 4 Primary Etiology: Lymphedema Wound Location: Right Lower Leg - Anterior Wound Status: Open Wounding Event: Gradually Appeared Comorbid Hypertension, Type II Diabetes, History: Neuropathy Date Acquired: 01/25/2018 Weeks Of Treatment: 1 Clustered Wound: No Photos Photo Uploaded By: [name] on 02/02/2018 08:08:29 Wound Measurements Length: (cm) 2.3 Width: (cm) 1 Depth: (cm) 0.1 Area: (cm) 1.806 Volume: (cm) 0.181 % Reduction in Area: -3742.6% % Reduction in Volume: -3520% Epithelialization: None Tunneling: No Undermining: No Wound Description Full Thickness Without Exposed  Support Classification: Structures Wound Margin: Distinct, outline attached Exudate Large Amount: Exudate Type: Purulent Exudate Color: yellow, brown, green Slough/Fibrino Yes Wound Bed Granulation Amount: Medium (34-66%) Exposed Structure Granulation Quality: Red Fascia Exposed: No Necrotic Amount: Medium (34-66%) Fat Layer (Subcutaneous Tissue) Exposed: No Necrotic Quality: Adherent Slough Tendon Exposed: No Muscle Exposed: No Joint Exposed: No Bone Exposed: No Periwound Skin Texture Texture Color Mejorado, Ollie [name] ([id number]) No Abnormalities Noted: No No Abnormalities Noted: No Moisture Temperature / Pain No Abnormalities Noted: No Temperature: No Abnormality Tenderness on Palpation: Yes Wound Preparation Ulcer Cleansing: Rinsed/Irrigated with Saline, Other: soap and water, Topical Anesthetic Applied: Other: lidocaine 4%, Treatment Notes Wound #4 (Right, Anterior Lower Leg) 1. Cleansed with: Clean wound with Normal Saline Cleanse wound with antibacterial soap and water 2. Anesthetic Topical Lidocaine 4% cream to wound bed prior to debridement 3. Peri-wound Care: Moisturizing lotion 4. Dressing Applied: Other dressing (specify in notes) 5. Secondary Dressing Applied ABD Pad 7. Secured with 4 Layer Compression System - Bilateral Notes silvercell Electronic Signature(s) Signed: 02/02/2018 2:43:37 PM By: [name] Entered By: [name] on 02/01/2018 09:53:44 Dials, Chayce [name] ([id number]) -------------------------------------------------------------------------------- Wound Assessment Details Patient Name: Stgermain, [name] Date of Service: 02/01/2018 9:15 AM Medical Record Number: [id number] Patient Account Number: [id number] Date of Birth/Sex: Nov 09, 1970 (47 y.o. Male) Treating [name]: [name] Fiscal, [place]  Primary Care Collie Kittel: [name] Other Clinician: Referring Kenia Teagarden: [name] Treating Artha Chiasson/Extender: [name] in Treatment:  5 Wound Status Wound Number: 5 Primary Etiology: Lymphedema Wound Location: Right Lower Leg - Medial Secondary Diabetic Wound/Ulcer of the Lower Etiology: Extremity Wounding Event: Blister Wound Status: Open Date Acquired: 02/01/2018 Comorbid History: Hypertension, Type II Diabetes, Weeks Of Treatment: 0 Neuropathy Clustered Wound: No Photos Photo Uploaded By: [name] on 02/02/2018 08:08:29 Wound Measurements Length: (cm) 2.5 Width: (cm) 5.5 Depth: (cm) 0.1 Area: (cm) 10.799 Volume: (cm) 1.08 % Reduction in Area: % Reduction in Volume: Epithelialization: None Tunneling: No Undermining: No Wound Description Full Thickness Without Exposed Support Classification: Structures Wound Margin: Flat and Intact Exudate Large Amount: Exudate Type: Serous Exudate Color: amber Foul Odor After Cleansing: No Slough/Fibrino Yes Wound Bed Granulation Amount: None Present (0%) Exposed Structure Necrotic Amount: Large (67-100%) Fascia Exposed: No Necrotic Quality: Eschar Fat Layer (Subcutaneous Tissue) Exposed: No Tendon Exposed: No Muscle Exposed: No Joint Exposed: No Bone Exposed: No Periwound Skin Texture Texture Color Brockel, Kevontae [name] ([id number]) No Abnormalities Noted: No No Abnormalities Noted: No Moisture Temperature / Pain No Abnormalities Noted: No Temperature: No Abnormality Tenderness on Palpation: Yes Wound Preparation Ulcer Cleansing: Rinsed/Irrigated with Saline, Other: soap and water, Topical Anesthetic Applied: Other: lidocaine 4%, Treatment Notes Wound #5 (Right, Medial Lower Leg) 1. Cleansed with: Clean wound with Normal Saline Cleanse wound with antibacterial soap and water 2. Anesthetic Topical Lidocaine 4% cream to wound bed prior to debridement 3. Peri-wound Care: Moisturizing lotion 4. Dressing Applied: Other dressing (specify in notes) 5. Secondary Dressing Applied ABD Pad 7. Secured with 4 Layer Compression System -  Bilateral Notes [employer] Electronic Signature(s) Signed: 02/02/2018 2:43:37 PM By: [name] Entered By: [name] on 02/01/2018 09:55:18 Dugdale, Kortney [name] ([id number]) -------------------------------------------------------------------------------- Vitals Details Patient Name: Schaum, Nakia [name] Date of Service: 02/01/2018 9:15 AM Medical Record Number: [id number] Patient Account Number: [id number] Date of Birth/Sex: Jan 21, 1971 (47 y.o. Male) Treating [name]: [facility] [place] Lapaglia: [name] Other Clinician: Referring Willette Mudry: [name] Treating Macey Wurtz/Extender: [name] in Treatment: 5 Vital Signs Time Taken: 09:38 Temperature (F): 98.3 Height (in): 76 Pulse (bpm): 56 Weight (lbs): 364.4 Respiratory Rate (breaths/min): 20 Body Mass Index (BMI): 44.4 Blood Pressure (mmHg): 157/70 Reference Range: 80 - 120 mg / dl Electronic Signature(s) Signed: 02/02/2018 2:43:37 PM By: [name] Entered By: [name] on 02/01/2018 09:40:28

## 2018-02-03 NOTE — Progress Notes (Signed)
CARTER, KASSEL ([id number]) Visit Report for 02/01/2018 HPI Details Patient Name: [name], [name] Date of Service: 02/01/2018 9:15 AM Medical Record Number: [id number] Patient Account Number: [id number] Date of Birth/Sex: 01-05-1971 (47 y.o. Male) Treating RN: Cornell Barman Primary Care Provider: Gaetano Net Other Clinician: Referring Provider: Gaetano Net Treating Provider/Extender: Tito Dine in Treatment: 5 History of Present Illness HPI Description: 12/28/17; this is a now 47 year old man who is a type II diabetic. He was hospitalized from 10/01/17 through 10/19/17. He had an MSSA soft tissue and skin infection. 2 open areas on the left leg were identified he has a smaller area on the left medial calf superiorly just below the knee and a wound just above the left ankle on the posterior medial aspect. I think both of these were surgical IandD sites when he was in the hospital. He was discharged with a wound VAC at that point however this is since been taken off. He follows with Dr. Ola Spurr for the [facility] and he is still on chronic Keflex at 500 twice a day. At that time he was hospitalized his hemoglobin A1c was 15.1 however if I'm reading his endocrinologist notes correctly that is improved. He has been following with Dr. [name] at [facility] and he has been applying calcium alginate and Unna boots. He has home health changing the dressing. They have also been attempting to get him external compression pumps although the patient is unaware whether they've been approved by insurance at this point. as mentioned he has a smaller clean wound on the right lateral calf just below the knee and he has a much larger area just above the left ankle medially and posteriorly. Our intake nurse reported greenish purulent looking drainage.the patient did have surgical material sent to pathology in February. This showed chronic abscess The patient also has lymphedema stage III in the left  greater than right lower extremities. He has a history of blisters with wounds but these of all were always healed. The patient thinks that the lymphedema may have been present since he was about 47 years old i.e. about 30 years. He does not have graded pressure stockings and has not worn stockings. He does not have a distant history of DVT PE or phlebitis. He has not been systemically unwell fever no chills. He states that his Lasix is recently been reduced. He tells me his kidney function is at "30%" and he has been followed by Dr. [name] of nephrology. At one point he was on Lasix 80 twice a day however that's been cut back and he is now on Lasix at 20 twice a day. The patient has a history of PAD listed in his records although he comes from Dr. [name] I don't think is felt to have significant PAD. ABIs in our clinic were noncompressible bilaterally. 01/04/18; patient has a large wound on the left lateral lower calf and a small wound on the left medial upper calf. He has been to see his nephrologist who changed him to Demadex 40 mg a day. I'm hopeful this will help with his systemic fluid overload. He also has stage III lymphedema. Really no change in the 2 wounds since last week 01/11/18; the patient is down 13 pounds. He put his stage III lymphedema left leg in 4K compression last week and there is less edema fluid however we still haven't been able to communicate with home health but [facility] but the dressings have not been changed. The  patient noted an odor last week. He is also had compression pumps ordered by [facility] this as a not completed the paperwork stage. 01/18/18; patient continues to lose weight. Stage III lymphedema in the left greater than right leg under for alert compression. The major wound is on the left lateral ankle area. He apparently has bilateral compression pumps being brought to his house, these were ordered by [facility] Notable for the fact today he had some blisters on the right anterior leg together with some skin nodules. This is no doubt secondary to severe lymphedema. 01/25/18; the patient has obtained his compression pumps and is using them per vein and vascular instructions 3 times a day for an hour. He also saw [name] of vein and vascular. He was felt to have venous insufficiency but did not suggest any intervention also improved edema. It was suggested that he have compression stockings 20-30 mm on a daily basis in addition to compression pumps. Bosket, Nai A. ([id number]) The patient arrives in clinic today with a layer of unna under for layer compression. he seems to have some trouble with the degree of compression. He has open areas on the left lateral ankle area which is his major wound left upper medial calf and a superficial open area on the right anterior shin area which was blistered last week. He has skin changes on the right anterior calf which I think are no doubt secondary to lymphedema skin nodules etc. 02/01/18; the patient comes in telling us his nephrologist have to his torsemide. Unfortunately today he is put on 7 pounds by our scales. He has blisters all over the anterior and medial part of his right calf and a new open wound. He also has soupy green drainage coming out of the left lateral calf /ankle wound. Electronic Signature(s) Signed: 02/01/2018 6:13:14 PM By: Linton Ham MD Entered By: Linton Ham on 02/01/2018 11:00:37 Boehne, Gabor AMarland Kitchen ([id number]) -------------------------------------------------------------------------------- Physical Exam Details Patient Name: [name], [name] Date of Service: 02/01/2018 9:15 AM Medical Record Number: [id number] Patient Account Number: [id number] Date of Birth/Sex: 03-Oct-1970 (47 y.o. Male) Treating RN: Cornell Barman Primary Care Provider: Gaetano Net Other Clinician: Referring Provider: Gaetano Net Treating Provider/Extender:  Tito Dine in Treatment: 5 Constitutional Patient is hypertensive.. Pulse regular and within target range for patient.[name] Respirations regular, non-labored and within target range.. Temperature is normal and within the target range for the patient.[name] appears in no distress. Respiratory Respiratory effort is easy and symmetric bilaterally. Rate is normal at rest and on room air.. Bilateral breath sounds are clear and equal in all lobes with no wheezes, rales or rhonchi.. Cardiovascular Heart rhythm and rate regular, without murmur or gallop.JVP is elevated no murmurs. Pedal pulses palpable bilaterally. Integumentary (Hair, Skin) severe lymphedema chronic hemosiderin deposition bilaterally. On the right leg this is centered anteriorly. Notes wound exam; the upper medial calf wound is improved. The area on the left lateral area looks about the same. Apparently copious green drainage on the wound on intake. I cultured this area again he is on Keflex oMultiple blisters and a new open wound on the right anterior tibial area. new open area. Electronic Signature(s) Signed: 02/01/2018 6:13:14 PM By: Linton Ham MD Entered By: Linton Ham on 02/01/2018 11:03:12 Wick[place], [place]ztownMarland Kitchen ([id number]) -------------------------------------------------------------------------------- Physician Orders Details Patient Name: Ledvina, Tallon A. Date of Service: 02/01/2018 9:15 AM Medical Record Number: [id number] Patient Account Number: [id number] Date of Birth/Sex: 12/27/1970 (47 y.o. Male) Treating RN:  Cornell Barman Primary Care Provider: Gaetano Net Other Clinician: Referring Provider: Gaetano Net Treating Provider/Extender: Tito Dine in Treatment: 5 Verbal / Phone Orders: No Diagnosis Coding Wound Cleansing Wound #1 Left,Proximal,Medial Lower Leg o Clean wound with Normal Saline. Wound #2 Left,Lateral,Posterior Lower Leg o Clean wound with Normal Saline. Wound #4  Right,Anterior Lower Leg o Clean wound with Normal Saline. Wound #5 Right,Medial Lower Leg o Clean wound with Normal Saline. Anesthetic (add to Medication List) Wound #1 Left,Proximal,Medial Lower Leg o Topical Lidocaine 4% cream applied to wound bed prior to debridement (In Clinic Only). Wound #2 Left,Lateral,Posterior Lower Leg o Topical Lidocaine 4% cream applied to wound bed prior to debridement (In Clinic Only). Wound #4 Right,Anterior Lower Leg o Topical Lidocaine 4% cream applied to wound bed prior to debridement (In Clinic Only). Wound #5 Right,Medial Lower Leg o Topical Lidocaine 4% cream applied to wound bed prior to debridement (In Clinic Only). Skin Barriers/Peri-Wound Care Wound #1 Left,Proximal,Medial Lower Leg o Barrier cream Wound #2 Left,Lateral,Posterior Lower Leg o Barrier cream Wound #4 Right,Anterior Lower Leg o Barrier cream Wound #5 Right,Medial Lower Leg o Barrier cream Primary Wound Dressing Wound #1 Left,Proximal,Medial Lower Leg o Silver Alginate Wound #2 Left,Lateral,Posterior Lower Leg Grist, Danyell A. ([id number]) o Silver Alginate Wound #4 Right,Anterior Lower Leg o Silver Alginate Wound #5 Right,Medial Lower Leg o Silver Alginate Secondary Dressing Wound #1 Left,Proximal,Medial Lower Leg o ABD pad o XtraSorb Wound #2 Left,Lateral,Posterior Lower Leg o ABD pad o XtraSorb Wound #4 Right,Anterior Lower Leg o ABD pad o XtraSorb Wound #5 Right,Medial Lower Leg o ABD pad o XtraSorb Dressing Change Frequency Wound #1 Left,Proximal,Medial Lower Leg o Change Dressing Monday, Wednesday, Friday - Wednesday in clinic. Wound #2 Left,Lateral,Posterior Lower Leg o Change Dressing Monday, Wednesday, Friday - Wednesday in clinic. Wound #4 Right,Anterior Lower Leg o Change Dressing Monday, Wednesday, Friday - Wednesday in clinic. Wound #5 Right,Medial Lower Leg o Change Dressing Monday, Wednesday,  Friday - Wednesday in clinic. Follow-up Appointments Wound #1 Left,Proximal,Medial Lower Leg o Return Appointment in 1 week. Wound #2 Left,Lateral,Posterior Lower Leg o Return Appointment in 1 week. Wound #4 Right,Anterior Lower Leg o Return Appointment in 1 week. Wound #5 Right,Medial Lower Leg o Return Appointment in 1 week. Edema Control Wound #1 Left,Proximal,Medial Lower Leg o 4 Layer Compression System - Bilateral Wound #2 Left,Lateral,Posterior Lower Leg Baetz, Bladimir A. ([id number]) o 4 Layer Compression System - Bilateral Wound #4 Right,Anterior Lower Leg o 4 Layer Compression System - Bilateral Wound #5 Right,Medial Lower Leg o 4 Layer Compression System - Bilateral Home Health Wound #1 Left,Proximal,Medial Lower Leg o [place] Visits - Monday and Friday o Home Health Nurse may visit PRN to address patientos wound care needs. o FACE TO FACE ENCOUNTER: MEDICARE and MEDICAID PATIENTS: I certify that this patient is under my care and that I had a face-to-face encounter that meets the physician face-to-face encounter requirements with this patient on this date. The encounter with the patient was in whole or in part for the following MEDICAL CONDITION: (primary reason for [place]) MEDICAL NECESSITY: I certify, that based on my findings, NURSING services are a medically necessary home health service. HOME BOUND STATUS: I certify that my clinical findings support that this patient is homebound (i.e., Due to illness or injury, pt requires aid of supportive devices such as crutches, cane, wheelchairs, walkers, the use of special transportation or the assistance of another person to leave their place of residence. There is a  normal inability to leave the home and doing so requires considerable and taxing effort. Other absences are for medical reasons / religious services and are infrequent or of short duration when for other reasons). o If  current dressing causes regression in wound condition, may D/C ordered dressing product/s and apply Normal Saline Moist Dressing daily until next [place] / Other MD appointment. [place] of regression in wound condition at [phone]. Wound #2 [place] Visits - Monday and Friday o Home Health Nurse may visit PRN to address patientos wound care needs. o FACE TO FACE ENCOUNTER: MEDICARE and MEDICAID PATIENTS: I certify that this patient is under my care and that I had a face-to-face encounter that meets the physician face-to-face encounter requirements with this patient on this date. The encounter with the patient was in whole or in part for the following MEDICAL CONDITION: (primary reason for [place]) MEDICAL NECESSITY: I certify, that based on my findings, NURSING services are a medically necessary home health service. HOME BOUND STATUS: I certify that my clinical findings support that this patient is homebound (i.e., Due to illness or injury, pt requires aid of supportive devices such as crutches, cane, wheelchairs, walkers, the use of special transportation or the assistance of another person to leave their place of residence. There is a normal inability to leave the home and doing so requires considerable and taxing effort. Other absences are for medical reasons / religious services and are infrequent or of short duration when for other reasons). o If current dressing causes regression in wound condition, may D/C ordered dressing product/s and apply Normal Saline Moist Dressing daily until next [place] / Other MD appointment. [place] of regression in wound condition at [phone]. Wound #4 [place] Visits - Monday and Friday o Home Health Nurse may visit PRN to address patientos wound care needs. o FACE TO FACE  ENCOUNTER: MEDICARE and MEDICAID PATIENTS: I certify that this patient is under my care and that I had a face-to-face encounter that meets the physician face-to-face encounter requirements with this patient on this date. The encounter with the patient was in whole or in part for the following MEDICAL CONDITION: (primary reason for [place]) MEDICAL NECESSITY: I certify, that based on my findings, NURSING services are a medically necessary home health service. HOME BOUND STATUS: I certify that my clinical findings support that this patient is homebound (i.e., Due to illness or injury, pt requires aid of supportive devices such as crutches, cane, wheelchairs, walkers, the use of special transportation or the assistance of another person to leave their place of residence. There is a normal inability to leave the home and doing so requires considerable and taxing effort. Other absences are for medical reasons / religious services and are infrequent or of short duration when for other reasons). Farone, Danny A. ([id number]) o If current dressing causes regression in wound condition, may D/C ordered dressing product/s and apply Normal Saline Moist Dressing daily until next [place] / Other MD appointment. [place] of regression in wound condition at [phone]. Wound #5 Right,Medial Lower Leg o [place] Visits - Monday and Friday o Home Health Nurse may visit PRN to address patientos wound care needs. o FACE TO FACE ENCOUNTER: MEDICARE and MEDICAID PATIENTS: I certify that this patient is under my care and that I had a face-to-face encounter that meets the  physician face-to-face encounter requirements with this patient on this date. The encounter with the patient was in whole or in part for the following MEDICAL CONDITION: (primary reason for [place]) MEDICAL NECESSITY: I certify, that based on my findings, NURSING services are a  medically necessary home health service. HOME BOUND STATUS: I certify that my clinical findings support that this patient is homebound (i.e., Due to illness or injury, pt requires aid of supportive devices such as crutches, cane, wheelchairs, walkers, the use of special transportation or the assistance of another person to leave their place of residence. There is a normal inability to leave the home and doing so requires considerable and taxing effort. Other absences are for medical reasons / religious services and are infrequent or of short duration when for other reasons). o If current dressing causes regression in wound condition, may D/C ordered dressing product/s and apply Normal Saline Moist Dressing daily until next [place] / Other MD appointment. [place] of regression in wound condition at [phone]. Medications-please add to medication list. Wound #1 Left,Proximal,Medial Lower Leg o P.O. Antibiotics - Continue antibiotics Wound #2 Left,Lateral,Posterior Lower Leg o P.O. Antibiotics - Continue antibiotics Wound #4 Right,Anterior Lower Leg o P.O. Antibiotics - Continue antibiotics Wound #5 Right,Medial Lower Leg o P.O. Antibiotics - Continue antibiotics Laboratory o Bacteria identified in Wound by Culture (MICRO) - Left Leg oooo LOINC Code: 8676-7 oooo Convenience Name: Wound culture routine Electronic Signature(s) Signed: 02/01/2018 6:13:14 PM By: Linton Ham MD Signed: 02/02/2018 2:15:42 PM By: Gretta Cool, BSN, RN, CWS, Kim RN, BSN Entered By: Gretta Cool, BSN, RN, CWS, Kim on 02/01/2018 12:04:26 Brophy, Jefte AMarland Kitchen ([id number]) -------------------------------------------------------------------------------- Problem List Details Patient Name: [name], [name] Date of Service: 02/01/2018 9:15 AM Medical Record Number: [id number] Patient Account Number: [id number] Date of Birth/Sex: May 03, 1971 (47 y.o. 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 ([id number]) -------------------------------------------------------------------------------- Progress Note Details Patient Name: [name], [name] Date of Service: 02/01/2018 9:15 AM Medical Record Number: [id number] Patient Account Number: [id number] Date of Birth/Sex: Dec 01, 1970 (47 y.o. Male) Treating RN: Cornell Barman Primary Care Provider: Gaetano Net Other Clinician: Referring Provider: Gaetano Net Treating Provider/Extender: Tito Dine in Treatment: 5 Subjective History of Present Illness (HPI) 12/28/17; this is a now 47 year old man who is a type II diabetic. He was hospitalized from 10/01/17 through 10/19/17. He had an MSSA soft tissue and skin infection. 2 open areas on the left leg were identified he has a smaller area on the left medial calf superiorly just below the knee and a wound just above the left ankle on the posterior medial aspect. I think both of these were surgical IandD sites when he was in the hospital. He was discharged with a wound VAC at that point however this is since been taken off. He follows with Dr. Ola Spurr for the [facility] and he is still on chronic Keflex  at 500 twice a day. At that time he  was hospitalized his hemoglobin A1c was 15.1 however if I'm reading his endocrinologist notes correctly that is improved. He has been following with Dr. [name] at vein and vascular and he has been applying calcium alginate and Unna boots. He has home health changing the dressing. They have also been attempting to get him external compression pumps although the patient is unaware whether they've been approved by insurance at this point. as mentioned he has a smaller clean wound on the right lateral calf just below the knee and he has a much larger area just above the left ankle medially and posteriorly. Our intake nurse reported greenish purulent looking drainage.the patient did have surgical material sent to pathology in February. This showed chronic abscess The patient also has lymphedema stage III in the left greater than right lower extremities. He has a history of blisters with wounds but these of all were always healed. The patient thinks that the lymphedema may have been present since he was about 47 years old i.e. about 30 years. He does not have graded pressure stockings and has not worn stockings. He does not have a distant history of DVT PE or phlebitis. He has not been systemically unwell fever no chills. He states that his Lasix is recently been reduced. He tells me his kidney function is at "30%" and he has been followed by Dr. [name] of nephrology. At one point he was on Lasix 80 twice a day however that's been cut back and he is now on Lasix at 20 twice a day. The patient has a history of PAD listed in his records although he comes from Dr. [name] I don't think is felt to have significant PAD. ABIs in our clinic were noncompressible bilaterally. 01/04/18; patient has a large wound on the left lateral lower calf and a small wound on the left medial upper calf. He has been to see his nephrologist who changed him to Demadex 40 mg a day. I'm hopeful this will help with his systemic fluid  overload. He also has stage III lymphedema. Really no change in the 2 wounds since last week 01/11/18; the patient is down 13 pounds. He put his stage III lymphedema left leg in 4K compression last week and there is less edema fluid however we still haven't been able to communicate with home health but [facility] but the dressings have not been changed. The patient noted an odor last week. He is also had compression pumps ordered by [place] [facility] this as a not completed the paperwork stage. 01/18/18; patient continues to lose weight. Stage III lymphedema in the left greater than right leg under for alert compression. The major wound is on the left lateral ankle area. He apparently has bilateral compression pumps being brought to his house, these were ordered by [facility] Notable for the fact today he had some blisters on the right anterior leg together with some skin nodules. This is no doubt secondary to severe lymphedema. 01/25/18; the patient has obtained his compression pumps and is using them per vein and vascular instructions 3 times a day for an hour. He also saw [name] of vein and vascular. He was felt to have venous insufficiency but did not suggest any intervention also improved edema. It was suggested that he have compression stockings 20-30 mm on a daily basis in addition to compression pumps. The patient arrives in clinic today with a layer  of unna under for layer compression. he seems to have some trouble with the degree of compression. He has open areas on the left lateral ankle area which is his major wound left upper medial calf and a [name], [name] ([id number]) superficial open area on the right anterior shin area which was blistered last week. He has skin changes on the right anterior calf which I think are no doubt secondary to lymphedema skin nodules etc. 02/01/18; the patient comes in telling us his nephrologist have to his torsemide.  Unfortunately today he is put on 7 pounds by our scales. He has blisters all over the anterior and medial part of his right calf and a new open wound. He also has soupy green drainage coming out of the left lateral calf /ankle wound. Objective Constitutional Patient is hypertensive.. Pulse regular and within target range for patient.[name] Respirations regular, non-labored and within target range.. Temperature is normal and within the target range for the patient.[name] appears in no distress. Vitals Time Taken: 9:38 AM, Height: 76 in, Weight: 364.4 lbs, BMI: 44.4, Temperature: 98.3 F, Pulse: 56 bpm, Respiratory Rate: 20 breaths/min, Blood Pressure: 157/70 mmHg. Respiratory Respiratory effort is easy and symmetric bilaterally. Rate is normal at rest and on room air.. Bilateral breath sounds are clear and equal in all lobes with no wheezes, rales or rhonchi.. Cardiovascular Heart rhythm and rate regular, without murmur or gallop.JVP is elevated no murmurs. Pedal pulses palpable bilaterally. General Notes: wound exam; the upper medial calf wound is improved. The area on the left lateral area looks about the same. Apparently copious green drainage on the wound on intake. I cultured this area again he is on Keflex Multiple blisters and a new open wound on the right anterior tibial area. new open area. Integumentary (Hair, Skin) severe lymphedema chronic hemosiderin deposition bilaterally. On the right leg this is centered anteriorly. Wound #1 status is Open. Original cause of wound was Gradually Appeared. The wound is located on the Left,Proximal,Medial Lower Leg. The wound measures 0.5cm length x 0.7cm width x 0.1cm depth; 0.275cm^2 area and 0.027cm^3 volume. There is Fat Layer (Subcutaneous Tissue) Exposed exposed. There is no tunneling or undermining noted. There is a small amount of serosanguineous drainage noted. Foul odor after cleansing was noted. The wound margin is distinct with the outline  attached to the wound base. There is no granulation within the wound bed. There is a large (67- 100%) amount of necrotic tissue within the wound bed including Eschar. The periwound skin appearance exhibited: Hemosiderin Staining. The periwound skin appearance did not exhibit: Callus, Crepitus, Excoriation, Induration, Rash, Scarring, Dry/Scaly, Maceration, Atrophie Blanche, Cyanosis, Ecchymosis, Mottled, Pallor, Rubor, Erythema. Periwound temperature was noted as No Abnormality. Wound #2 status is Open. Original cause of wound was Gradually Appeared. The wound is located on the Left,Lateral,Posterior Lower Leg. The wound measures 3.2cm length x 7cm width x 0.6cm depth; 17.593cm^2 area and 10.556cm^3 volume. There is Fat Layer (Subcutaneous Tissue) Exposed exposed. There is no tunneling or undermining noted. There is a large amount of purulent drainage noted. Foul odor after cleansing was noted. The wound margin is distinct with the outline attached to the wound base. There is medium (34-66%) red, pink granulation within the wound bed. There is a medium (34-66%) amount of necrotic tissue within the wound bed including Adherent Slough. The periwound skin appearance exhibited: Maceration. The periwound skin appearance did not exhibit: Callus, Crepitus, Excoriation, Induration, Rash, Scarring, Dry/Scaly, Atrophie Blanche, Cyanosis, Ecchymosis, Hemosiderin Staining, Mottled, Pallor, Rubor,  Erythema. Periwound temperature was noted as No Abnormality. The periwound has tenderness on palpation. Wound #4 status is Open. Original cause of wound was Gradually Appeared. The wound is located on the [name], [name] ([id number]) Lower Leg. The wound measures 2.3cm length x 1cm width x 0.1cm depth; 1.806cm^2 area and 0.181cm^3 volume. There is no tunneling or undermining noted. There is a large amount of purulent drainage noted. The wound margin is distinct with the outline attached to the wound  base. There is medium (34-66%) red granulation within the wound bed. There is a medium (34- 66%) amount of necrotic tissue within the wound bed including Adherent Slough. Periwound temperature was noted as No Abnormality. The periwound has tenderness on palpation. Wound #5 status is Open. Original cause of wound was Blister. The wound is located on the Right,Medial Lower Leg. The wound measures 2.5cm length x 5.5cm width x 0.1cm depth; 10.799cm^2 area and 1.08cm^3 volume. There is no tunneling or undermining noted. There is a large amount of serous drainage noted. The wound margin is flat and intact. There is no granulation within the wound bed. There is a large (67-100%) amount of necrotic tissue within the wound bed including Eschar. Periwound temperature was noted as No Abnormality. The periwound has tenderness on palpation. Assessment Active Problems ICD-10 Non-pressure chronic ulcer of left calf with necrosis of muscle Lymphedema, not elsewhere classified Chronic venous hypertension (idiopathic) with ulcer and inflammation of left lower extremity Type 2 diabetes mellitus with other skin ulcer Procedures Wound #1 Pre-procedure diagnosis of Wound #1 is a Diabetic Wound/Ulcer of the Lower Extremity located on the Left,Proximal,Medial Lower Leg . There was a Four Layer Compression Therapy Procedure by Montey Hora, RN. Post procedure Diagnosis Wound #1: Same as Pre-Procedure Plan Wound Cleansing: Wound #1 Left,Proximal,Medial Lower Leg: Clean wound with Normal Saline. Wound #2 Left,Lateral,Posterior Lower Leg: Clean wound with Normal Saline. Wound #4 Right,Anterior Lower Leg: Clean wound with Normal Saline. Wound #5 Right,Medial Lower Leg: Clean wound with Normal Saline. Anesthetic (add to Medication List): Wound #1 Left,Proximal,Medial Lower Leg: Topical Lidocaine 4% cream applied to wound bed prior to debridement (In Clinic Only). Wound #2 Left,Lateral,Posterior Lower  Leg: Topical Lidocaine 4% cream applied to wound bed prior to debridement (In Clinic Only). Mox[place], Aero A. ([id number]) Wound #4 Right,Anterior Lower Leg: Topical Lidocaine 4% cream applied to wound bed prior to debridement (In Clinic Only). Wound #5 Right,Medial Lower Leg: Topical Lidocaine 4% cream applied to wound bed prior to debridement (In Clinic Only). Skin Barriers/Peri-Wound Care: Wound #1 Left,Proximal,Medial Lower Leg: Barrier cream Wound #2 Left,Lateral,Posterior Lower Leg: Barrier cream Wound #4 Right,Anterior Lower Leg: Barrier cream Wound #5 Right,Medial Lower Leg: Barrier cream Primary Wound Dressing: Wound #1 Left,Proximal,Medial Lower Leg: Silver Alginate Wound #2 Left,Lateral,Posterior Lower Leg: Silver Alginate Wound #4 Right,Anterior Lower Leg: Silver Alginate Wound #5 Right,Medial Lower Leg: Silver Alginate Secondary Dressing: Wound #1 Left,Proximal,Medial Lower Leg: ABD pad XtraSorb Wound #2 Left,Lateral,Posterior Lower Leg: ABD pad XtraSorb Wound #4 Right,Anterior Lower Leg: ABD pad XtraSorb Wound #5 Right,Medial Lower Leg: ABD pad XtraSorb Dressing Change Frequency: Wound #1 Left,Proximal,Medial Lower Leg: Change Dressing Monday, Wednesday, Friday - Wednesday in clinic. Wound #2 Left,Lateral,Posterior Lower Leg: Change Dressing Monday, Wednesday, Friday - Wednesday in clinic. Wound #4 Right,Anterior Lower Leg: Change Dressing Monday, Wednesday, Friday - Wednesday in clinic. Wound #5 Right,Medial Lower Leg: Change Dressing Monday, Wednesday, Friday - Wednesday in clinic. Follow-up Appointments: Wound #1 Left,Proximal,Medial Lower Leg: Return Appointment in 1 week. Wound #2 Left,Lateral,Posterior  Lower Leg: Return Appointment in 1 week. Wound #4 Right,Anterior Lower Leg: Return Appointment in 1 week. Wound #5 Right,Medial Lower Leg: Return Appointment in 1 week. Edema Control: Wound #1 Left,Proximal,Medial Lower Leg: 4 Layer Compression  System - Bilateral Wound #2 Left,Lateral,Posterior Lower Leg: 4 Layer Compression System - Bilateral Wound #4 Right,Anterior Lower Leg: 4 Layer Compression System - Bilateral [name], [name] ([id number]) Wound #5 Right,Medial Lower Leg: 4 Layer Compression System - Bilateral Home Health: Wound #1 Left,Proximal,Medial Lower Leg: Continue Home Health Visits - Monday and Friday Home Health Nurse may visit PRN to address patient s wound care needs. FACE TO FACE ENCOUNTER: MEDICARE and MEDICAID PATIENTS: I certify that this patient is under my care and that I had a face-to-face encounter that meets the physician face-to-face encounter requirements with this patient on this date. The encounter with the patient was in whole or in part for the following MEDICAL CONDITION: (primary reason for [place]) MEDICAL NECESSITY: I certify, that based on my findings, NURSING services are a medically necessary home health service. HOME BOUND STATUS: I certify that my clinical findings support that this patient is homebound (i.e., Due to illness or injury, pt requires aid of supportive devices such as crutches, cane, wheelchairs, walkers, the use of special transportation or the assistance of another person to leave their place of residence. There is a normal inability to leave the home and doing so requires considerable and taxing effort. Other absences are for medical reasons / religious services and are infrequent or of short duration when for other reasons). If current dressing causes regression in wound condition, may D/C ordered dressing product/s and apply Normal Saline Moist Dressing daily until next [place] / Other MD appointment. [place] of regression in wound condition at [phone]. Wound #2 Left,Lateral,Posterior Lower Leg: Continue Home Health Visits - Monday and Friday Home Health Nurse may visit PRN to address patient s wound care needs. FACE TO FACE  ENCOUNTER: MEDICARE and MEDICAID PATIENTS: I certify that this patient is under my care and that I had a face-to-face encounter that meets the physician face-to-face encounter requirements with this patient on this date. The encounter with the patient was in whole or in part for the following MEDICAL CONDITION: (primary reason for [place]) MEDICAL NECESSITY: I certify, that based on my findings, NURSING services are a medically necessary home health service. HOME BOUND STATUS: I certify that my clinical findings support that this patient is homebound (i.e., Due to illness or injury, pt requires aid of supportive devices such as crutches, cane, wheelchairs, walkers, the use of special transportation or the assistance of another person to leave their place of residence. There is a normal inability to leave the home and doing so requires considerable and taxing effort. Other absences are for medical reasons / religious services and are infrequent or of short duration when for other reasons). If current dressing causes regression in wound condition, may D/C ordered dressing product/s and apply Normal Saline Moist Dressing daily until next [place] / Other MD appointment. [place] of regression in wound condition at [phone]. Wound #4 Right,Anterior Lower Leg: [place] Visits - Monday and Friday Home Health Nurse may visit PRN to address patient s wound care needs. FACE TO FACE ENCOUNTER: MEDICARE and MEDICAID PATIENTS: I certify that this patient is under my care and that I had a face-to-face encounter that meets the physician face-to-face encounter requirements with this patient on  this date. The encounter with the patient was in whole or in part for the following MEDICAL CONDITION: (primary reason for [place]) MEDICAL NECESSITY: I certify, that based on my findings, NURSING services are a medically necessary home health service. HOME  BOUND STATUS: I certify that my clinical findings support that this patient is homebound (i.e., Due to illness or injury, pt requires aid of supportive devices such as crutches, cane, wheelchairs, walkers, the use of special transportation or the assistance of another person to leave their place of residence. There is a normal inability to leave the home and doing so requires considerable and taxing effort. Other absences are for medical reasons / religious services and are infrequent or of short duration when for other reasons). If current dressing causes regression in wound condition, may D/C ordered dressing product/s and apply Normal Saline Moist Dressing daily until next [place] / Other MD appointment. [place] of regression in wound condition at [phone]. Wound #5 Right,Medial Lower Leg: Continue Home Health Visits - Monday and Friday Home Health Nurse may visit PRN to address patient s wound care needs. FACE TO FACE ENCOUNTER: MEDICARE and MEDICAID PATIENTS: I certify that this patient is under my care and that I had a face-to-face encounter that meets the physician face-to-face encounter requirements with this patient on this date. The encounter with the patient was in whole or in part for the following MEDICAL CONDITION: (primary reason for [place]) MEDICAL NECESSITY: I certify, that based on my findings, NURSING services are a medically necessary home health service. HOME BOUND STATUS: I certify that my clinical findings support that this patient is homebound (i.e., Due to illness or injury, pt requires aid of supportive devices such as crutches, cane, wheelchairs, walkers, the use of special transportation or the assistance of another person to leave their place of residence. There is a normal inability to leave the home and doing so requires considerable and taxing effort. Other absences are for medical reasons / religious services and are  infrequent or of short duration when for other reasons). [name], [name] ([id number]) If current dressing causes regression in wound condition, may D/C ordered dressing product/s and apply Normal Saline Moist Dressing daily until next [place] / Other MD appointment. [place] of regression in wound condition at [phone]. Medications-please add to medication list.: Wound #1 Left,Proximal,Medial Lower Leg: P.O. Antibiotics - Continue antibiotics Wound #2 Left,Lateral,Posterior Lower Leg: P.O. Antibiotics - Continue antibiotics Wound #4 Right,Anterior Lower Leg: P.O. Antibiotics - Continue antibiotics Wound #5 Right,Medial Lower Leg: P.O. Antibiotics - Continue antibiotics Laboratory ordered were: Wound culture routine - Left Leg #1 at this point we're going to continue with this overall today based dressings under for lower compression #2 he is home health changing the dressing #3 if his weight goes up again next week I may need to contact his nephrologist. This seems to localize in his right calf where he has some degree of pitting edema along with the lymphedema #4 no evidence of a DVT #5 he doesn't have a history of arterial insufficiency. [facility] [place] [facility] 6 CandS done of the left lateral calf wound because of the greenish drainage. He is already on Keflex I'll need to research this again. We did not prescribe this Electronic Signature(s) Signed: 02/01/2018 12:04:41 PM By: Gretta Cool, BSN, RN, CWS, Kim RN, BSN Signed: 02/01/2018 6:13:14 PM By: Linton Ham MD Entered By: Gretta Cool, BSN, RN, CWS, Maudie Mercury  on 02/01/2018 12:04:41 [place], [place]. ([id number]) -------------------------------------------------------------------------------- SuperBill Details Patient Name: Crumpacker, [name] Date of Service: 02/01/2018 Medical Record Number: [id number] Patient Account Number: [id number] Date of Birth/Sex: [birth year] (47 y.o. Male) Treating RN: Cornell Barman Primary Care Provider: Gaetano Net Other Clinician: Referring Provider: Gaetano Net Treating Provider/Extender: Tito Dine in Treatment: 5 Diagnosis Coding ICD-10 Codes Code Description [phone] Non-pressure chronic ulcer of left calf with necrosis of muscle I89.0 Lymphedema, not elsewhere classified I87.332 Chronic venous hypertension (idiopathic) with ulcer and inflammation of left lower extremity E11.622 Type 2 diabetes mellitus with other skin ulcer Facility Procedures CPT4: Description Modifier Quantity Code [id number] BILATERAL: Application of multi-layer venous compression system; leg (below 1 knee), including ankle and foot. Physician Procedures CPT4: Description Modifier Quantity Code [id number] - WC PHYS LEVEL 4 - EST PT 1 ICD-10 Diagnosis Description L97.223 Non-pressure chronic ulcer of left calf with necrosis of muscle I89.0 Lymphedema, not elsewhere classified I87.332 Chronic venous  hypertension (idiopathic) with ulcer and inflammation of left lower extremity Electronic Signature(s) Signed: 02/01/2018 6:13:14 PM By: Linton Ham MD Entered By: Linton Ham on 02/01/2018 11:06:49

## 2018-02-04 LAB — AEROBIC CULTURE W GRAM STAIN (SUPERFICIAL SPECIMEN)

## 2018-02-04 LAB — AEROBIC CULTURE  (SUPERFICIAL SPECIMEN)

## 2018-02-08 ENCOUNTER — Encounter: Payer: BLUE CROSS/BLUE SHIELD | Admitting: Internal Medicine

## 2018-02-08 DIAGNOSIS — E11622 Type 2 diabetes mellitus with other skin ulcer: Secondary | ICD-10-CM | POA: Diagnosis not present

## 2018-02-11 NOTE — Progress Notes (Signed)
DAMOND, BORCHERS ([id number]) Visit Report for 02/08/2018 HPI Details Patient Name: Dowdell, [name] Date of Service: 02/08/2018 10:00 AM Medical Record Number: [id number] Patient Account Number: [id number] Date of Birth/Sex: 30-Jun-1971 (47 y.o. M) Treating RN: Primary Care Provider: Gaetano Net Other Clinician: Referring Provider: Gaetano Net Treating Provider/Extender: Tito Dine in Treatment: 6 History of Present Illness HPI Description: 12/28/17; this is a now 47 year old man who is a type II diabetic. He was hospitalized from 10/01/17 through 10/19/17. He had an MSSA soft tissue and skin infection. 2 open areas on the left leg were identified he has a smaller area on the left medial calf superiorly just below the knee and a wound just above the left ankle on the posterior medial aspect. I think both of these were surgical IandD sites when he was in the hospital. He was discharged with a wound VAC at that point however this is since been taken off. He follows with Dr. Ola Spurr for the [facility] and he is still on chronic Keflex at 500 twice a day. At that time he was hospitalized his hemoglobin A1c was 15.1 however if I'm reading his endocrinologist notes correctly that is improved. He has been following with Dr. [name] at [facility] and he has been applying calcium alginate and Unna boots. He has home health changing the dressing. They have also been attempting to get him external compression pumps although the patient is unaware whether they've been approved by insurance at this point. as mentioned he has a smaller clean wound on the right lateral calf just below the knee and he has a much larger area just above the left ankle medially and posteriorly. Our intake nurse reported greenish purulent looking drainage.the patient did have surgical material sent to pathology in February. This showed chronic abscess The patient also has lymphedema stage III in the left greater than  right lower extremities. He has a history of blisters with wounds but these of all were always healed. The patient thinks that the lymphedema may have been present since he was about 46 years old i.e. about 30 years. He does not have graded pressure stockings and has not worn stockings. He does not have a distant history of DVT PE or phlebitis. He has not been systemically unwell fever no chills. He states that his Lasix is recently been reduced. He tells me his kidney function is at "30%" and he has been followed by Dr. [name] of nephrology. At one point he was on Lasix 80 twice a day however that's been cut back and he is now on Lasix at 20 twice a day. The patient has a history of PAD listed in his records although he comes from Dr. [name] I don't think is felt to have significant PAD. ABIs in our clinic were noncompressible bilaterally. 01/04/18; patient has a large wound on the left lateral lower calf and a small wound on the left medial upper calf. He has been to see his nephrologist who changed him to Demadex 40 mg a day. I'm hopeful this will help with his systemic fluid overload. He also has stage III lymphedema. Really no change in the 2 wounds since last week 01/11/18; the patient is down 13 pounds. He put his stage III lymphedema left leg in 4K compression last week and there is less edema fluid however we still haven't been able to communicate with home health but [facility] but the dressings have not been changed. The patient noted  an odor last week. He is also had compression pumps ordered by [facility] this as a not completed the paperwork stage. 01/18/18; patient continues to lose weight. Stage III lymphedema in the left greater than right leg under for alert compression. The major wound is on the left lateral ankle area. He apparently has bilateral compression pumps being brought to his house, these were ordered by [facility] Notable for  the fact today he had some blisters on the right anterior leg together with some skin nodules. This is no doubt secondary to severe lymphedema. 01/25/18; the patient has obtained his compression pumps and is using them per vein and vascular instructions 3 times a day for an hour. He also saw [name] of vein and vascular. He was felt to have venous insufficiency but did not suggest any intervention also improved edema. It was suggested that he have compression stockings 20-30 mm on a daily basis in addition to compression pumps. Finan, Normand A. ([id number]) The patient arrives in clinic today with a layer of unna under for layer compression. he seems to have some trouble with the degree of compression. He has open areas on the left lateral ankle area which is his major wound left upper medial calf and a superficial open area on the right anterior shin area which was blistered last week. He has skin changes on the right anterior calf which I think are no doubt secondary to lymphedema skin nodules etc. 02/01/18; the patient comes in telling us his nephrologist have to his torsemide. Unfortunately today he is put on 7 pounds by our scales. He has blisters all over the anterior and medial part of his right calf and a new open wound. He also has soupy green drainage coming out of the left lateral calf /ankle wound. 02/08/18; culture I did last week of the left lateral ankle wound grew both Pseudomonas and Morganella. He is on Keflex from Dr. Ola Spurr in the hospital. I will need to review these notes.in any case Keflex is not going to cover these 2 organisms. I'm probably going to added ciprofloxacin today for 1 week. A lot of drainage that looks purulent last week. He is not complaining of pain however he has managed to put on 10 pounds in 2 weeks by our scales in this clinic. He is going to see his nephrologist tomorrow Electronic Signature(s) Signed: 02/08/2018 4:49:36 PM By: Linton Ham MD Entered  By: Linton Ham on 02/08/2018 11:40:06 Birenbaum, Winston A. ([id number]) -------------------------------------------------------------------------------- Physical Exam Details Patient Name: Nofziger, Quadir A. Date of Service: 02/08/2018 10:00 AM Medical Record Number: [id number] Patient Account Number: [id number] Date of Birth/Sex: Oct 27, 1970 (47 y.o. M) Treating RN: Primary Care Provider: Gaetano Net Other Clinician: Referring Provider: Gaetano Net Treating Provider/Extender: Tito Dine in Treatment: 6 Constitutional Patient is hypertensive.. Pulse regular and within target range for patient.[name] Respirations regular, non-labored and within target range.. Temperature is normal and within the target range for the patient.[name] appears in no distress. Respiratory Respiratory effort is easy and symmetric bilaterally. Rate is normal at rest and on room air.. Bilateral breath sounds are clear and equal in all lobes with no wheezes, rales or rhonchi.. Cardiovascular heart sounds are distant JVP is not elevated he does not have coccyx edema. pedal pulses are palpable bilaterally. Lymphatic nonpalpable in the popliteal area bilaterally. Integumentary (Hair, Skin) massive lymphedema in the left leg greater than right. Signs of chronic venous insufficiency as well. Notes wound exam; the medial  right calf looks about the same measuring a little larger however. There is no overt tenderness around the wound and less drainage. Small divot appears early is about the same. oHe has 5 superficial open areas across the anterior right leg from medial to lateral. The most substantial one is medially Electronic Signature(s) Signed: 02/08/2018 4:49:36 PM By: Linton Ham MD Entered By: Linton Ham on 02/08/2018 11:42:50 Kyser, Romone A. ([id number]) -------------------------------------------------------------------------------- Physician Orders Details Patient Name: Clewis, Milbert A. Date of  Service: 02/08/2018 10:00 AM Medical Record Number: [id number] Patient Account Number: [id number] Date of Birth/Sex: 08-Sep-1970 (47 y.o. M) Treating RN: Cornell Barman Primary Care Provider: Gaetano Net Other Clinician: Referring Provider: Gaetano Net Treating Provider/Extender: Tito Dine in Treatment: 6 Verbal / Phone Orders: No Diagnosis Coding Wound Cleansing Wound #2 Left,Lateral,Posterior Lower Leg o Clean wound with Normal Saline. Wound #4 Right,Anterior Lower Leg o Clean wound with Normal Saline. Wound #5 Right,Medial Lower Leg o Clean wound with Normal Saline. Wound #6 Right,Lateral Lower Leg o Clean wound with Normal Saline. Anesthetic (add to Medication List) Wound #2 Left,Lateral,Posterior Lower Leg o Topical Lidocaine 4% cream applied to wound bed prior to debridement (In Clinic Only). Wound #4 Right,Anterior Lower Leg o Topical Lidocaine 4% cream applied to wound bed prior to debridement (In Clinic Only). Wound #5 Right,Medial Lower Leg o Topical Lidocaine 4% cream applied to wound bed prior to debridement (In Clinic Only). Wound #6 Right,Lateral Lower Leg o Topical Lidocaine 4% cream applied to wound bed prior to debridement (In Clinic Only). Skin Barriers/Peri-Wound Care Wound #2 Left,Lateral,Posterior Lower Leg o Barrier cream Wound #4 Right,Anterior Lower Leg o Barrier cream Wound #5 Right,Medial Lower Leg o Barrier cream Wound #6 Right,Lateral Lower Leg o Barrier cream Primary Wound Dressing Wound #2 Left,Lateral,Posterior Lower Leg o Silver Alginate Wound #4 Right,Anterior Lower Leg Coates, Aarav A. ([id number]) o Silver Alginate Wound #5 Right,Medial Lower Leg o Silver Alginate Wound #6 Right,Lateral Lower Leg o Silver Alginate Secondary Dressing Wound #2 Left,Lateral,Posterior Lower Leg o ABD pad o XtraSorb Wound #4 Right,Anterior Lower Leg o ABD pad o XtraSorb Wound #5 Right,Medial Lower  Leg o ABD pad o XtraSorb Wound #6 Right,Lateral Lower Leg o ABD pad o XtraSorb Dressing Change Frequency Wound #2 Left,Lateral,Posterior Lower Leg o Change Dressing Monday, Wednesday, Friday - Wednesday in clinic. Wound #4 Right,Anterior Lower Leg o Change Dressing Monday, Wednesday, Friday - Wednesday in clinic. Wound #5 Right,Medial Lower Leg o Change Dressing Monday, Wednesday, Friday - Wednesday in clinic. Wound #6 Right,Lateral Lower Leg o Change Dressing Monday, Wednesday, Friday - Wednesday in clinic. Follow-up Appointments Wound #2 Left,Lateral,Posterior Lower Leg o Return Appointment in 1 week. Wound #4 Right,Anterior Lower Leg o Return Appointment in 1 week. Wound #5 Right,Medial Lower Leg o Return Appointment in 1 week. Wound #6 Right,Lateral Lower Leg o Return Appointment in 1 week. Edema Control Wound #2 Left,Lateral,Posterior Lower Leg o 4 Layer Compression System - Bilateral Wound #4 Right,Anterior Lower Leg Kimmey, Hurshel A. ([id number]) o 4 Layer Compression System - Bilateral Wound #5 Right,Medial Lower Leg o 4 Layer Compression System - Bilateral Wound #6 Right,Lateral Lower Leg o 4 Layer Compression System - Bilateral Home Health Wound #2 [place] Visits - Monday and Friday o Home Health Nurse may visit PRN to address patientos wound care needs. o FACE TO FACE ENCOUNTER: MEDICARE and MEDICAID PATIENTS: I certify that this patient is under my care and that I had a face-to-face encounter that  meets the physician face-to-face encounter requirements with this patient on this date. The encounter with the patient was in whole or in part for the following MEDICAL CONDITION: (primary reason for [place]) MEDICAL NECESSITY: I certify, that based on my findings, NURSING services are a medically necessary home health service. HOME BOUND STATUS: I certify that my clinical  findings support that this patient is homebound (i.e., Due to illness or injury, pt requires aid of supportive devices such as crutches, cane, wheelchairs, walkers, the use of special transportation or the assistance of another person to leave their place of residence. There is a normal inability to leave the home and doing so requires considerable and taxing effort. Other absences are for medical reasons / religious services and are infrequent or of short duration when for other reasons). o If current dressing causes regression in wound condition, may D/C ordered dressing product/s and apply Normal Saline Moist Dressing daily until next [place] / Other MD appointment. [place] of regression in wound condition at [phone]. Wound #4 [place] Visits - Monday and Friday o Home Health Nurse may visit PRN to address patientos wound care needs. o FACE TO FACE ENCOUNTER: MEDICARE and MEDICAID PATIENTS: I certify that this patient is under my care and that I had a face-to-face encounter that meets the physician face-to-face encounter requirements with this patient on this date. The encounter with the patient was in whole or in part for the following MEDICAL CONDITION: (primary reason for [place]) MEDICAL NECESSITY: I certify, that based on my findings, NURSING services are a medically necessary home health service. HOME BOUND STATUS: I certify that my clinical findings support that this patient is homebound (i.e., Due to illness or injury, pt requires aid of supportive devices such as crutches, cane, wheelchairs, walkers, the use of special transportation or the assistance of another person to leave their place of residence. There is a normal inability to leave the home and doing so requires considerable and taxing effort. Other absences are for medical reasons / religious services and are infrequent or of short  duration when for other reasons). o If current dressing causes regression in wound condition, may D/C ordered dressing product/s and apply Normal Saline Moist Dressing daily until next [place] / Other MD appointment. [place] of regression in wound condition at [phone]. Wound #5 Right,Medial Lower Leg o [place] Visits - Monday and Friday o Home Health Nurse may visit PRN to address patientos wound care needs. o FACE TO FACE ENCOUNTER: MEDICARE and MEDICAID PATIENTS: I certify that this patient is under my care and that I had a face-to-face encounter that meets the physician face-to-face encounter requirements with this patient on this date. The encounter with the patient was in whole or in part for the following MEDICAL CONDITION: (primary reason for [place]) MEDICAL NECESSITY: I certify, that based on my findings, NURSING services are a medically necessary home health service. HOME BOUND STATUS: I certify that my clinical findings support that this patient is homebound (i.e., Due to illness or injury, pt requires aid of supportive devices such as crutches, cane, wheelchairs, walkers, the use of special transportation or the assistance of another person to leave their place of residence. There is a normal inability to leave the home and doing so requires considerable and taxing effort. Other absences are for medical reasons / religious services and are infrequent or of short duration  when for other reasons). [place], Alonte A. ([id number]) o If current dressing causes regression in wound condition, may D/C ordered dressing product/s and apply Normal Saline Moist Dressing daily until next [place] / Other MD appointment. [place] of regression in wound condition at [phone]. Wound #6 Right,Lateral Lower Leg o [place] Visits - Monday and Friday o Home Health Nurse may visit PRN to  address patientos wound care needs. o FACE TO FACE ENCOUNTER: MEDICARE and MEDICAID PATIENTS: I certify that this patient is under my care and that I had a face-to-face encounter that meets the physician face-to-face encounter requirements with this patient on this date. The encounter with the patient was in whole or in part for the following MEDICAL CONDITION: (primary reason for [place]) MEDICAL NECESSITY: I certify, that based on my findings, NURSING services are a medically necessary home health service. HOME BOUND STATUS: I certify that my clinical findings support that this patient is homebound (i.e., Due to illness or injury, pt requires aid of supportive devices such as crutches, cane, wheelchairs, walkers, the use of special transportation or the assistance of another person to leave their place of residence. There is a normal inability to leave the home and doing so requires considerable and taxing effort. Other absences are for medical reasons / religious services and are infrequent or of short duration when for other reasons). o If current dressing causes regression in wound condition, may D/C ordered dressing product/s and apply Normal Saline Moist Dressing daily until next [place] / Other MD appointment. [place] of regression in wound condition at [phone]. Medications-please add to medication list. Wound #2 Left,Lateral,Posterior Lower Leg o P.O. Antibiotics - Continue antibiotics Wound #4 Right,Anterior Lower Leg o P.O. Antibiotics - Continue antibiotics Wound #5 Right,Medial Lower Leg o P.O. Antibiotics - Continue antibiotics Wound #6 Right,Lateral Lower Leg o P.O. Antibiotics - Continue antibiotics Patient Medications Allergies: lisinopril Notifications Medication Indication Start End Cipro wound infection 02/08/2018 DOSE oral 500 mg tablet - 1 tablet oral bid for 7 days Electronic Signature(s) Signed: 02/08/2018  11:50:56 AM By: Linton Ham MD Entered By: Linton Ham on 02/08/2018 11:50:54 Dizdarevic, Jerral A. ([id number]) -------------------------------------------------------------------------------- Problem List Details Patient Name: Quackenbush, Broc A. Date of Service: 02/08/2018 10:00 AM Medical Record Number: [id number] Patient Account Number: [id number] Date of Birth/Sex: May 10, 1971 (47 y.o. 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 ([id number]) -------------------------------------------------------------------------------- Progress Note Details Patient Name: Qin, Syler A. Date of Service: 02/08/2018 10:00 AM Medical Record Number: [id number] Patient Account Number: [id number] Date of Birth/Sex: 1971/07/14 (47 y.o. M) Treating RN: Primary Care Provider: Gaetano Net Other Clinician: Referring Provider: Gaetano Net Treating Provider/Extender: Tito Dine in Treatment: 6 Subjective History of Present Illness (HPI) 12/28/17; this is a now 47 year old man who is a type II diabetic. He was hospitalized from 10/01/17 through 10/19/17. He had an MSSA  soft tissue and skin infection. 2 open areas on the left leg were identified he has a smaller area on the  left medial calf superiorly just below the knee and a wound just above the left ankle on the posterior medial aspect. I think both of these were surgical IandD sites when he was in the hospital. He was discharged with a wound VAC at that point however this is since been taken off. He follows with Dr. Ola Spurr for the [facility] and he is still on chronic Keflex at 500 twice a day. At that time he was hospitalized his hemoglobin A1c was 15.1 however if I'm reading his endocrinologist notes correctly that is improved. He has been following with Dr. [name] at [facility] vascular and he has been applying calcium alginate and Unna boots. He has home health changing the dressing. They have also been attempting to get him external compression pumps although the patient is unaware whether they've been approved by insurance at this point. as mentioned he has a smaller clean wound on the right lateral calf just below the knee and he has a much larger area just above the left ankle medially and posteriorly. Our intake nurse reported greenish purulent looking drainage.the patient did have surgical material sent to pathology in February. This showed chronic abscess The patient also has lymphedema stage III in the left greater than right lower extremities. He has a history of blisters with wounds but these of all were always healed. The patient thinks that the lymphedema may have been present since he was about 47 years old i.e. about 30 years. He does not have graded pressure stockings and has not worn stockings. He does not have a distant history of DVT PE or phlebitis. He has not been systemically unwell fever no chills. He states that his Lasix is recently been reduced. He tells me his kidney function is at "30%" and he has been followed by Dr. [name] of nephrology. At one point he was on Lasix 80 twice a day however that's been cut back and he is now on Lasix at 20 twice a day. The patient has a history of PAD  listed in his records although he comes from Dr. [name] I don't think is felt to have significant PAD. ABIs in our clinic were noncompressible bilaterally. 01/04/18; patient has a large wound on the left lateral lower calf and a small wound on the left medial upper calf. He has been to see his nephrologist who changed him to Demadex 40 mg a day. I'm hopeful this will help with his systemic fluid overload. He also has stage III lymphedema. Really no change in the 2 wounds since last week 01/11/18; the patient is down 13 pounds. He put his stage III lymphedema left leg in 4K compression last week and there is less edema fluid however we still haven't been able to communicate with home health but [facility] but the dressings have not been changed. The patient noted an odor last week. He is also had compression pumps ordered by [facility] this as a not completed the paperwork stage. 01/18/18; patient continues to lose weight. Stage III lymphedema in the left greater than right leg under for alert compression. The major wound is on the left lateral ankle area. He apparently has bilateral compression pumps being brought to his house, these were ordered by [facility] Notable for the fact today he had some blisters on the right anterior leg  together with some skin nodules. This is no doubt secondary to severe lymphedema. 01/25/18; the patient has obtained his compression pumps and is using them per vein and vascular instructions 3 times a day for an hour. He also saw [name] of vein and vascular. He was felt to have venous insufficiency but did not suggest any intervention also improved edema. It was suggested that he have compression stockings 20-30 mm on a daily basis in addition to compression pumps. The patient arrives in clinic today with a layer of unna under for layer compression. he seems to have some trouble with the degree of compression. He has open  areas on the left lateral ankle area which is his major wound left upper medial calf and a [place], Edon A. ([id number]) superficial open area on the right anterior shin area which was blistered last week. He has skin changes on the right anterior calf which I think are no doubt secondary to lymphedema skin nodules etc. 02/01/18; the patient comes in telling us his nephrologist have to his torsemide. Unfortunately today he is put on 7 pounds by our scales. He has blisters all over the anterior and medial part of his right calf and a new open wound. He also has soupy green drainage coming out of the left lateral calf /ankle wound. 02/08/18; culture I did last week of the left lateral ankle wound grew both Pseudomonas and Morganella. He is on Keflex from Dr. Ola Spurr in the hospital. I will need to review these notes.in any case Keflex is not going to cover these 2 organisms. I'm probably going to added ciprofloxacin today for 1 week. A lot of drainage that looks purulent last week. He is not complaining of pain however he has managed to put on 10 pounds in 2 weeks by our scales in this clinic. He is going to see his nephrologist tomorrow Objective Constitutional Patient is hypertensive.. Pulse regular and within target range for patient.[name] Respirations regular, non-labored and within target range.. Temperature is normal and within the target range for the patient.[name] appears in no distress. Vitals Time Taken: 10:33 AM, Height: 76 in, Weight: 366.5 lbs, BMI: 44.6, Temperature: 98.2 F, Pulse: 58 bpm, Respiratory Rate: 20 breaths/min, Blood Pressure: 168/80 mmHg. Respiratory Respiratory effort is easy and symmetric bilaterally. Rate is normal at rest and on room air.. Bilateral breath sounds are clear and equal in all lobes with no wheezes, rales or rhonchi.. Cardiovascular heart sounds are distant JVP is not elevated he does not have coccyx edema. pedal pulses are palpable  bilaterally. Lymphatic nonpalpable in the popliteal area bilaterally. General Notes: wound exam; the medial right calf looks about the same measuring a little larger however. There is no overt tenderness around the wound and less drainage. Small divot appears early is about the same. He has 5 superficial open areas across the anterior right leg from medial to lateral. The most substantial one is medially Integumentary (Hair, Skin) massive lymphedema in the left leg greater than right. Signs of chronic venous insufficiency as well. Wound #1 status is Healed - Epithelialized. Original cause of wound was Gradually Appeared. The wound is located on the Left,Proximal,Medial Lower Leg. The wound measures 0cm length x 0cm width x 0cm depth; 0cm^2 area and 0cm^3 volume. Wound #2 status is Open. Original cause of wound was Gradually Appeared. The wound is located on the Left,Lateral,Posterior Lower Leg. The wound measures 3cm length x 4.7cm width x 0.3cm depth; 11.074cm^2 area and 3.322cm^3 volume. There is Fat Layer (  Subcutaneous Tissue) Exposed exposed. There is no tunneling or undermining noted. There is a large amount of purulent drainage noted. Foul odor after cleansing was noted. The wound margin is distinct with the outline attached to the wound base. There is medium (34-66%) red, pink granulation within the wound bed. There is a medium (34-66%) amount of necrotic tissue within the wound bed including Adherent Slough. The periwound skin appearance exhibited: Maceration, Erythema. The periwound skin appearance did not exhibit: Callus, Crepitus, Excoriation, Induration, Rash, Scarring, Dry/Scaly, Atrophie Blanche, Cyanosis, Ecchymosis, Hemosiderin Staining, Mottled, Pallor, Rubor. The surrounding wound skin color is noted with erythema which is circumferential. Periwound temperature was noted as No Abnormality. The periwound has tenderness on palpation. Mccarley, Bricen A. ([id number]) Wound #4 status is  Open. Original cause of wound was Gradually Appeared. The wound is located on the Right,Anterior Lower Leg. The wound measures 2.5cm length x 5.5cm width x 0.1cm depth; 10.799cm^2 area and 1.08cm^3 volume. There is no tunneling or undermining noted. There is a large amount of purulent drainage noted. The wound margin is distinct with the outline attached to the wound base. There is medium (34-66%) red granulation within the wound bed. There is a medium (34-66%) amount of necrotic tissue within the wound bed including Adherent Slough. The periwound skin appearance did not exhibit: Callus, Crepitus, Excoriation, Induration, Rash, Scarring, Dry/Scaly, Maceration, Atrophie Blanche, Cyanosis, Ecchymosis, Hemosiderin Staining, Mottled, Pallor, Rubor, Erythema. Periwound temperature was noted as No Abnormality. The periwound has tenderness on palpation. Wound #5 status is Open. Original cause of wound was Blister. The wound is located on the Right,Medial Lower Leg. The wound measures 7.5cm length x 4.5cm width x 0.1cm depth; 26.507cm^2 area and 2.651cm^3 volume. There is Fat Layer (Subcutaneous Tissue) Exposed exposed. There is no tunneling or undermining noted. There is a large amount of serous drainage noted. The wound margin is flat and intact. There is medium (34-66%) red granulation within the wound bed. There is a medium (34-66%) amount of necrotic tissue within the wound bed including Adherent Slough. The periwound skin appearance exhibited: Excoriation. The periwound skin appearance did not exhibit: Callus, Crepitus, Induration, Rash, Scarring, Dry/Scaly, Maceration, Atrophie Blanche, Cyanosis, Ecchymosis, Hemosiderin Staining, Mottled, Pallor, Rubor, Erythema. Periwound temperature was noted as No Abnormality. The periwound has tenderness on palpation. Wound #6 status is Open. Original cause of wound was Gradually Appeared. The wound is located on the Right,Lateral Lower Leg. The wound measures  1cm length x 1cm width x 0.1cm depth; 0.785cm^2 area and 0.079cm^3 volume. There is Fat Layer (Subcutaneous Tissue) Exposed exposed. There is no tunneling or undermining noted. There is a large amount of serous drainage noted. The wound margin is distinct with the outline attached to the wound base. There is large (67-100%) red granulation within the wound bed. There is no necrotic tissue within the wound bed. The periwound skin appearance did not exhibit: Callus, Crepitus, Excoriation, Induration, Rash, Scarring, Dry/Scaly, Maceration, Atrophie Blanche, Cyanosis, Ecchymosis, Hemosiderin Staining, Mottled, Pallor, Rubor, Erythema. Periwound temperature was noted as No Abnormality. Assessment Active Problems ICD-10 Non-pressure chronic ulcer of left calf with necrosis of muscle Lymphedema, not elsewhere classified Chronic venous hypertension (idiopathic) with ulcer and inflammation of left lower extremity Type 2 diabetes mellitus with other skin ulcer Procedures Wound #2 Pre-procedure diagnosis of Wound #2 is a Diabetic Wound/Ulcer of the Lower Extremity located on the Left,Lateral,Posterior Lower Leg . There was a Four Layer Compression Therapy Procedure by Cornell Barman, RN. Post procedure Diagnosis Wound #2: Same as Pre-Procedure Notes:  bilateral wraps. patient has been tolerating wraps.. Wound #4 Pre-procedure diagnosis of Wound #4 is a Lymphedema located on the Right,Anterior Lower Leg . There was a Four Layer Compression Therapy Procedure by Cornell Barman, RN. Post procedure Diagnosis Wound #4: Same as Pre-Procedure Notes: bilateral wraps. patient has been tolerating wraps.[name] Oriley, Jahbari A. ([id number]) Wound #5 Pre-procedure diagnosis of Wound #5 is a Lymphedema located on the Right,Medial Lower Leg . There was a Four Layer Compression Therapy Procedure by Cornell Barman, RN. Post procedure Diagnosis Wound #5: Same as Pre-Procedure Notes: bilateral wraps. patient has been tolerating  wraps.. Plan Wound Cleansing: Wound #2 Left,Lateral,Posterior Lower Leg: Clean wound with Normal Saline. Wound #4 Right,Anterior Lower Leg: Clean wound with Normal Saline. Wound #5 Right,Medial Lower Leg: Clean wound with Normal Saline. Wound #6 Right,Lateral Lower Leg: Clean wound with Normal Saline. Anesthetic (add to Medication List): Wound #2 Left,Lateral,Posterior Lower Leg: Topical Lidocaine 4% cream applied to wound bed prior to debridement (In Clinic Only). Wound #4 Right,Anterior Lower Leg: Topical Lidocaine 4% cream applied to wound bed prior to debridement (In Clinic Only). Wound #5 Right,Medial Lower Leg: Topical Lidocaine 4% cream applied to wound bed prior to debridement (In Clinic Only). Wound #6 Right,Lateral Lower Leg: Topical Lidocaine 4% cream applied to wound bed prior to debridement (In Clinic Only). Skin Barriers/Peri-Wound Care: Wound #2 Left,Lateral,Posterior Lower Leg: Barrier cream Wound #4 Right,Anterior Lower Leg: Barrier cream Wound #5 Right,Medial Lower Leg: Barrier cream Wound #6 Right,Lateral Lower Leg: Barrier cream Primary Wound Dressing: Wound #2 Left,Lateral,Posterior Lower Leg: Silver Alginate Wound #4 Right,Anterior Lower Leg: Silver Alginate Wound #5 Right,Medial Lower Leg: Silver Alginate Wound #6 Right,Lateral Lower Leg: Silver Alginate Secondary Dressing: Wound #2 Left,Lateral,Posterior Lower Leg: ABD pad XtraSorb Wound #4 Right,Anterior Lower Leg: ABD pad XtraSorb Wound #5 Right,Medial Lower Leg: ABD pad XtraSorb Kindle, Trevione A. ([id number]) Wound #6 Right,Lateral Lower Leg: ABD pad XtraSorb Dressing Change Frequency: Wound #2 Left,Lateral,Posterior Lower Leg: Change Dressing Monday, Wednesday, Friday - Wednesday in clinic. Wound #4 Right,Anterior Lower Leg: Change Dressing Monday, Wednesday, Friday - Wednesday in clinic. Wound #5 Right,Medial Lower Leg: Change Dressing Monday, Wednesday, Friday - Wednesday in  clinic. Wound #6 Right,Lateral Lower Leg: Change Dressing Monday, Wednesday, Friday - Wednesday in clinic. Follow-up Appointments: Wound #2 Left,Lateral,Posterior Lower Leg: Return Appointment in 1 week. Wound #4 Right,Anterior Lower Leg: Return Appointment in 1 week. Wound #5 Right,Medial Lower Leg: Return Appointment in 1 week. Wound #6 Right,Lateral Lower Leg: Return Appointment in 1 week. Edema Control: Wound #2 Left,Lateral,Posterior Lower Leg: 4 Layer Compression System - Bilateral Wound #4 Right,Anterior Lower Leg: 4 Layer Compression System - Bilateral Wound #5 Right,Medial Lower Leg: 4 Layer Compression System - Bilateral Wound #6 Right,Lateral Lower Leg: 4 Layer Compression System - Bilateral Home Health: Wound #2 Left,Lateral,Posterior Lower Leg: Continue Home Health Visits - Monday and Friday Home Health Nurse may visit PRN to address patient s wound care needs. FACE TO FACE ENCOUNTER: MEDICARE and MEDICAID PATIENTS: I certify that this patient is under my care and that I had a face-to-face encounter that meets the physician face-to-face encounter requirements with this patient on this date. The encounter with the patient was in whole or in part for the following MEDICAL CONDITION: (primary reason for [place]) MEDICAL NECESSITY: I certify, that based on my findings, NURSING services are a medically necessary home health service. HOME BOUND STATUS: I certify that my clinical findings support that this patient is homebound (i.e., Due to illness or injury, pt  requires aid of supportive devices such as crutches, cane, wheelchairs, walkers, the use of special transportation or the assistance of another person to leave their place of residence. There is a normal inability to leave the home and doing so requires considerable and taxing effort. Other absences are for medical reasons / religious services and are infrequent or of short duration when for other reasons). If  current dressing causes regression in wound condition, may D/C ordered dressing product/s and apply Normal Saline Moist Dressing daily until next [place] / Other MD appointment. [place] of regression in wound condition at [phone]. Wound #4 Right,Anterior Lower Leg: [place] Visits - Monday and Friday Home Health Nurse may visit PRN to address patient s wound care needs. FACE TO FACE ENCOUNTER: MEDICARE and MEDICAID PATIENTS: I certify that this patient is under my care and that I had a face-to-face encounter that meets the physician face-to-face encounter requirements with this patient on this date. The encounter with the patient was in whole or in part for the following MEDICAL CONDITION: (primary reason for [place]) MEDICAL NECESSITY: I certify, that based on my findings, NURSING services are a medically necessary home health service. HOME BOUND STATUS: I certify that my clinical findings support that this patient is homebound (i.e., Due to illness or injury, pt requires aid of supportive devices such as crutches, cane, wheelchairs, walkers, the use of special transportation or the assistance of another person to leave their place of residence. There is a normal inability to leave the home and doing so requires considerable and taxing effort. Other absences are for medical reasons / religious services and are infrequent or of short duration when for other reasons). If current dressing causes regression in wound condition, may D/C ordered dressing product/s and apply Normal Saline Moist Dressing daily until next [place] / Other MD appointment. [place] of regression in [place], [place]. ([id number]) wound condition at [phone]. Wound #5 Right,Medial Lower Leg: Continue Home Health Visits - Monday and Friday Home Health Nurse may visit PRN to address patient s wound care needs. FACE TO FACE ENCOUNTER:  MEDICARE and MEDICAID PATIENTS: I certify that this patient is under my care and that I had a face-to-face encounter that meets the physician face-to-face encounter requirements with this patient on this date. The encounter with the patient was in whole or in part for the following MEDICAL CONDITION: (primary reason for [place]) MEDICAL NECESSITY: I certify, that based on my findings, NURSING services are a medically necessary home health service. HOME BOUND STATUS: I certify that my clinical findings support that this patient is homebound (i.e., Due to illness or injury, pt requires aid of supportive devices such as crutches, cane, wheelchairs, walkers, the use of special transportation or the assistance of another person to leave their place of residence. There is a normal inability to leave the home and doing so requires considerable and taxing effort. Other absences are for medical reasons / religious services and are infrequent or of short duration when for other reasons). If current dressing causes regression in wound condition, may D/C ordered dressing product/s and apply Normal Saline Moist Dressing daily until next [place] / Other MD appointment. [place] of regression in wound condition at [phone]. Wound #6 Right,Lateral Lower Leg: Continue Home Health Visits - Monday and Friday Home Health Nurse may visit PRN to address patient s wound care needs. FACE TO FACE ENCOUNTER: MEDICARE  and MEDICAID PATIENTS: I certify that this patient is under my care and that I had a face-to-face encounter that meets the physician face-to-face encounter requirements with this patient on this date. The encounter with the patient was in whole or in part for the following MEDICAL CONDITION: (primary reason for [place]) MEDICAL NECESSITY: I certify, that based on my findings, NURSING services are a medically necessary home health service. HOME BOUND STATUS: I  certify that my clinical findings support that this patient is homebound (i.e., Due to illness or injury, pt requires aid of supportive devices such as crutches, cane, wheelchairs, walkers, the use of special transportation or the assistance of another person to leave their place of residence. There is a normal inability to leave the home and doing so requires considerable and taxing effort. Other absences are for medical reasons / religious services and are infrequent or of short duration when for other reasons). If current dressing causes regression in wound condition, may D/C ordered dressing product/s and apply Normal Saline Moist Dressing daily until next [place] / Other MD appointment. [place] of regression in wound condition at [phone]. Medications-please add to medication list.: Wound #2 Left,Lateral,Posterior Lower Leg: P.O. Antibiotics - Continue antibiotics Wound #4 Right,Anterior Lower Leg: P.O. Antibiotics - Continue antibiotics Wound #5 Right,Medial Lower Leg: P.O. Antibiotics - Continue antibiotics Wound #6 Right,Lateral Lower Leg: P.O. Antibiotics - Continue antibiotics The following medication(s) was prescribed: Cipro oral 500 mg tablet 1 tablet oral bid for 7 days for wound infection starting 02/08/2018 #1 I'd continue with silver alginate/xtrasorb/ABDs/4 lower compression #2 ciprofloxacin. The last creatinine I can see is 2.11 giving him an estimated creatinine clearance of 41. Ciprofloxacin dosed at 500 twice a day o7 days. He is not on Coumadin #3 unless we can control the swelling in his legs these wounds are not going to heal on either side. In fact the areas on the right leg are more impressive. I don't think he is a candidate for external compression pumps question insurance Electronic Signature(s) Signed: 02/08/2018 11:51:44 AM By: Linton Ham MD [name] ([id number]) Entered By: Linton Ham on 02/08/2018  11:51:43 Ports, Clem A. ([id number]) -------------------------------------------------------------------------------- Keith Details Patient Name: Southwell, Othal A. Date of Service: 02/08/2018 Medical Record Number: [id number] Patient Account Number: [id number] Date of Birth/Sex: 1971/08/22 (47 y.o. M) Treating RN: Cornell Barman Primary Care Provider: Gaetano Net Other Clinician: Referring Provider: Gaetano Net Treating Provider/Extender: Tito Dine in Treatment: 6 Diagnosis Coding ICD-10 Codes Code Description [phone] Non-pressure chronic ulcer of left calf with necrosis of muscle I89.0 Lymphedema, not elsewhere classified I87.332 Chronic venous hypertension (idiopathic) with ulcer and inflammation of left lower extremity E11.622 Type 2 diabetes mellitus with other skin ulcer L97.211 Non-pressure chronic ulcer of right calf limited to breakdown of skin Facility Procedures CPT4: Description Modifier Quantity Code [id number] BILATERAL: Application of multi-layer venous compression system; leg (below 1 knee), including ankle and foot. Physician Procedures CPT4 Code: [id number] Description: 92119 - WC PHYS LEVEL 3 - EST PT ICD-10 Diagnosis Description L97.223 Non-pressure chronic ulcer of left calf with necrosis of mu L97.211 Non-pressure chronic ulcer of right calf limited to breakdo I89.0 Lymphedema, not elsewhere classified Modifier: scle wn of skin Quantity: 1 Electronic Signature(s) Signed: 02/08/2018 4:49:36 PM By: Linton Ham MD Entered By: Linton Ham on 02/08/2018 11:48:14

## 2018-02-11 NOTE — Progress Notes (Signed)
JUELL, RADNEY ([id number]) Visit Report for 02/08/2018 Arrival Information Details Patient Name: Vandeventer, Eusebio A. Date of Service: 02/08/2018 10:00 AM Medical Record Number: [id number] Patient Account Number: [id number] Date of Birth/Sex: 1971-04-04 (47 y.o. M) Treating RN: Roger Shelter Primary Care Dino Borntreger: Gaetano Net Other Clinician: Referring Annistyn Depass: Gaetano Net Treating Lauretta Sallas/Extender: Tito Dine in Treatment: 6 Visit Information History Since Last Visit All ordered tests and consults were completed: No Patient Arrived: Ambulatory Added or deleted any medications: No Arrival Time: 10:33 Any new allergies or adverse reactions: No Accompanied By: self Had a fall or experienced change in No Transfer Assistance: None activities of daily living that may affect Patient Identification Verified: Yes risk of falls: Secondary Verification Process Yes Signs or symptoms of abuse/neglect since last visito No Completed: Hospitalized since last visit: No Patient Requires Transmission-Based No Implantable device outside of the clinic excluding No Precautions: cellular tissue based products placed in the center Patient Has Alerts: Yes since last visit: Patient Alerts: DM II Pain Present Now: No noncompressible bilateral Electronic Signature(s) Signed: 02/08/2018 4:05:56 PM By: Roger Shelter Entered By: Roger Shelter on 02/08/2018 10:33:25 Fults, Yassir A. ([id number]) -------------------------------------------------------------------------------- Compression Therapy Details Patient Name: Mcbrearty, Nicoles A. Date of Service: 02/08/2018 10:00 AM Medical Record Number: [id number] Patient Account Number: [id number] Date of Birth/Sex: 1971/06/30 (47 y.o. M) Treating RN: Cornell Barman Primary Care Kyeshia Zinn: Gaetano Net Other Clinician: Referring Jarnell Cordaro: Gaetano Net Treating Varetta Chavers/Extender: Tito Dine in Treatment: 6 Compression Therapy  Performed for Wound Assessment: Wound #2 Left,Lateral,Posterior Lower Leg Performed By: Clinician Cornell Barman, RN Compression Type: Four Layer Post Procedure Diagnosis Same as Pre-procedure Notes bilateral wraps. patient has been tolerating wraps. Electronic Signature(s) Signed: 02/09/2018 8:47:48 AM By: Gretta Cool, BSN, RN, CWS, Kim RN, BSN Entered By: Gretta Cool, BSN, RN, CWS, Kim on 02/08/2018 11:16:42 Nida, Ganesh AMarland Kitchen ([id number]) -------------------------------------------------------------------------------- Compression Therapy Details Patient Name: Loveday, Erving A. Date of Service: 02/08/2018 10:00 AM Medical Record Number: [id number] Patient Account Number: [id number] Date of Birth/Sex: February 17, 1971 (47 y.o. M) Treating RN: Cornell Barman Primary Care Nelsy Madonna: Gaetano Net Other Clinician: Referring Mamoudou Mulvehill: Gaetano Net Treating Jaylanie Boschee/Extender: Tito Dine in Treatment: 6 Compression Therapy Performed for Wound Assessment: Wound #5 Right,Medial Lower Leg Performed By: Clinician Cornell Barman, RN Compression Type: Four Layer Post Procedure Diagnosis Same as Pre-procedure Notes bilateral wraps. patient has been tolerating wraps. Electronic Signature(s) Signed: 02/09/2018 8:47:48 AM By: Gretta Cool, BSN, RN, CWS, Kim RN, BSN Entered By: Gretta Cool, BSN, RN, CWS, Kim on 02/08/2018 11:16:42 Kozikowski, Zakar AMarland Kitchen ([id number]) -------------------------------------------------------------------------------- Compression Therapy Details Patient Name: Kerlin, Marshell A. Date of Service: 02/08/2018 10:00 AM Medical Record Number: [id number] Patient Account Number: [id number] Date of Birth/Sex: 01/16/1971 (47 y.o. M) Treating RN: Cornell Barman Primary Care Demeshia Sherburne: Gaetano Net Other Clinician: Referring Lova Urbieta: Gaetano Net Treating Timmie Dugue/Extender: Tito Dine in Treatment: 6 Compression Therapy Performed for Wound Assessment: Wound #4 Right,Anterior Lower Leg Performed By: Clinician Cornell Barman, RN Compression Type: Four Layer Post Procedure Diagnosis Same as Pre-procedure Notes bilateral wraps. patient has been tolerating wraps. Electronic Signature(s) Signed: 02/09/2018 8:47:48 AM By: Gretta Cool, BSN, RN, CWS, Kim RN, BSN Entered By: Gretta Cool, BSN, RN, CWS, Kim on 02/08/2018 11:16:43 Messamore, [name] ([id number]) -------------------------------------------------------------------------------- Encounter Discharge Information Details Patient Name: Shockley, Dakhari A. Date of Service: 02/08/2018 10:00 AM Medical Record Number: [id number] Patient Account Number: [id number] Date of Birth/Sex: 1971-03-29 (47 y.o. M) Treating RN: Ahmed Prima Primary Care Kaira Stringfield: Gaetano Net Other Clinician: Referring Mikhai Bienvenue: Gaetano Net Treating Junice Fei/Extender:  ROBSON, MICHAEL G Weeks in Treatment: 6 Encounter Discharge Information Items Discharge Condition: Stable Ambulatory Status: Ambulatory Discharge Destination: Home Transportation: Private Auto Schedule Follow-up Appointment: Yes Clinical Summary of Care: Electronic Signature(s) Signed: 02/08/2018 3:54:16 PM By: Alric Quan Entered By: Alric Quan on 02/08/2018 11:37:38 Wigle, Dewain A. ([id number]) -------------------------------------------------------------------------------- Lower Extremity Assessment Details Patient Name: Printup, Juergen A. Date of Service: 02/08/2018 10:00 AM Medical Record Number: [id number] Patient Account Number: [id number] Date of Birth/Sex: 09-Mar-1971 (47 y.o. M) Treating RN: Roger Shelter Primary Care Yaresly Menzel: Gaetano Net Other Clinician: Referring Math Brazie: Gaetano Net Treating Mallissa Lorenzen/Extender: Tito Dine in Treatment: 6 Edema Assessment Assessed: [Left: No] [Right: No] Edema: [Left: Yes] [Right: Yes] Calf Left: Right: Point of Measurement: 42 cm From Medial Instep 56.6 cm 46 cm Ankle Left: Right: Point of Measurement: 12 cm From Medial Instep 47.6 cm 36 cm Vascular  Assessment Claudication: Claudication Assessment [Left:None] [Right:None] Pulses: Dorsalis Pedis Palpable: [Left:Yes] [Right:Yes] Posterior Tibial Extremity colors, hair growth, and conditions: Extremity Color: [Left:Red] [Right:Hyperpigmented] Hair Growth on Extremity: [Left:Yes] [Right:Yes] Temperature of Extremity: [Left:Warm] [Right:Warm] Capillary Refill: [Left:< 3 seconds] [Right:< 3 seconds] Toe Nail Assessment Left: Right: Thick: Yes Yes Discolored: Yes Yes Deformed: No No Improper Length and Hygiene: No No Electronic Signature(s) Signed: 02/08/2018 4:05:56 PM By: Roger Shelter Entered By: Roger Shelter on 02/08/2018 10:59:03 Fogelman, Eswin A. ([id number]) -------------------------------------------------------------------------------- Multi Wound Chart Details Patient Name: Iverson, Ransom A. Date of Service: 02/08/2018 10:00 AM Medical Record Number: [id number] Patient Account Number: [id number] Date of Birth/Sex: 08/30/1971 (47 y.o. M) Treating RN: Cornell Barman Primary Care Efosa Treichler: Gaetano Net Other Clinician: Referring Milan Clare: Gaetano Net Treating Jaqualin Serpa/Extender: Tito Dine in Treatment: 6 Vital Signs Height(in): 76 Pulse(bpm): 58 Weight(lbs): 366.5 Blood Pressure(mmHg): 168/80 Body Mass Index(BMI): 45 Temperature(F): 98.2 Respiratory Rate 20 (breaths/min): Photos: [1:No Photos] [2:No Photos] [4:No Photos] Wound Location: [1:Left, Proximal, Medial Lower Leg] [2:Left Lower Leg - Lateral, Posterior] [4:Right Lower Leg - Anterior] Wounding Event: [1:Gradually Appeared] [2:Gradually Appeared] [4:Gradually Appeared] Primary Etiology: [1:Diabetic Wound/Ulcer of the Lower Extremity] [2:Diabetic Wound/Ulcer of the Lower Extremity] [4:Lymphedema] Secondary Etiology: [1:Lymphedema] [2:Lymphedema] [4:N/A] Comorbid History: [1:N/A] [2:Hypertension, Type II Diabetes, Neuropathy] [4:Hypertension, Type II Diabetes, Neuropathy] Date Acquired:  [1:09/30/2017] [2:09/30/2017] [4:01/25/2018] Weeks of Treatment: [1:6] [2:6] [4:2] Wound Status: [1:Healed - Epithelialized] [2:Open] [4:Open] Clustered Wound: [1:No] [2:No] [4:No] Measurements L x W x D [1:0x0x0] [2:3x4.7x0.3] [4:2.5x5.5x0.1] (cm) Area (cm) : [1:0] [2:11.074] [4:10.799] Volume (cm) : [1:0] [2:3.322] [4:1.08] % Reduction in Area: [1:100.00%] [2:60.80%] [4:-22876.60%] % Reduction in Volume: [1:100.00%] [2:70.60%] [4:-21500.00%] Classification: [1:Grade 2] [2:Grade 2] [4:Full Thickness Without Exposed Support Structures] Exudate Amount: [1:N/A] [2:Large] [4:Large] Exudate Type: [1:N/A] [2:Purulent] [4:Purulent] Exudate Color: [1:N/A] [2:yellow, brown, green] [4:yellow, brown, green] Foul Odor After Cleansing: [1:N/A] [2:Yes] [4:N/A] Odor Anticipated Due to [1:N/A] [2:No] [4:N/A] Product Use: Wound Margin: [1:N/A] [2:Distinct, outline attached] [4:Distinct, outline attached] Granulation Amount: [1:N/A] [2:Medium (34-66%)] [4:Medium (34-66%)] Granulation Quality: [1:N/A] [2:Red, Pink] [4:Red] Necrotic Amount: [1:N/A] [2:Medium (34-66%)] [4:Medium (34-66%)] Epithelialization: [1:N/A] [2:None] [4:None] Periwound Skin Texture: [1:No Abnormalities Noted] [2:Excoriation: No Induration: No Callus: No Crepitus: No] [4:Excoriation: No Induration: No Callus: No Crepitus: No] Rash: No Rash: No Scarring: No Scarring: No Periwound Skin Moisture: No Abnormalities Noted Maceration: Yes Maceration: No Dry/Scaly: No Dry/Scaly: No Periwound Skin Color: No Abnormalities Noted Erythema: Yes Atrophie Blanche: No Atrophie Blanche: No Cyanosis: No Cyanosis: No Ecchymosis: No Ecchymosis: No Erythema: No Hemosiderin Staining: No Hemosiderin Staining: No Mottled: No Mottled: No Pallor: No Pallor: No Rubor: No Rubor: No  Erythema Location: N/A Circumferential N/A Temperature: N/A No Abnormality No Abnormality Tenderness on Palpation: No Yes Yes Wound Preparation: N/A Ulcer  Cleansing: Ulcer Cleansing: Rinsed/Irrigated with Saline, Rinsed/Irrigated with Saline Other: soap and water Topical Anesthetic Applied: Topical Anesthetic Applied: Other: lidocaine 4% Other: lidocaine 4% Procedures Performed: N/A Compression Therapy Compression Therapy Wound Number: 5 6 N/A Photos: No Photos No Photos N/A Wound Location: Right Lower Leg - Medial Right Lower Leg - Lateral N/A Wounding Event: Blister Gradually Appeared N/A Primary Etiology: Lymphedema Lymphedema N/A Secondary Etiology: Diabetic Wound/Ulcer of the N/A N/A Lower Extremity Comorbid History: Hypertension, Type II Hypertension, Type II N/A Diabetes, Neuropathy Diabetes, Neuropathy Date Acquired: 02/01/2018 02/08/2018 N/A Weeks of Treatment: 1 0 N/A Wound Status: Open Open N/A Clustered Wound: Yes No N/A Measurements L x W x D 7.5x4.5x0.1 1x1x0.1 N/A (cm) Area (cm) : 26.507 0.785 N/A Volume (cm) : 2.651 0.079 N/A % Reduction in Area: -145.50% 0.00% N/A % Reduction in Volume: -145.50% 0.00% N/A Classification: Full Thickness Without Full Thickness Without N/A Exposed Support Structures Exposed Support Structures Exudate Amount: Large Large N/A Exudate Type: Serous Serous N/A Exudate Color: amber amber N/A Foul Odor After Cleansing: No No N/A Odor Anticipated Due to N/A N/A N/A Product Use: Wound Margin: Flat and Intact Distinct, outline attached N/A Granulation Amount: Medium (34-66%) Large (67-100%) N/A Granulation Quality: Red Red N/A Necrotic Amount: Medium (34-66%) None Present (0%) N/A Exposed Structures: Fat Layer (Subcutaneous Fat Layer (Subcutaneous N/A Tissue) Exposed: Yes Tissue) Exposed: Yes Fascia: No Fascia: No Laux, Javonte A. ([id number]) Tendon: No Tendon: No Muscle: No Muscle: No Joint: No Joint: No Bone: No Bone: No Epithelialization: None None N/A Periwound Skin Texture: Excoriation: Yes Excoriation: No N/A Induration: No Induration: No Callus: No Callus:  No Crepitus: No Crepitus: No Rash: No Rash: No Scarring: No Scarring: No Periwound Skin Moisture: Maceration: No Maceration: No N/A Dry/Scaly: No Dry/Scaly: No Periwound Skin Color: Atrophie Blanche: No Atrophie Blanche: No N/A Cyanosis: No Cyanosis: No Ecchymosis: No Ecchymosis: No Erythema: No Erythema: No Hemosiderin Staining: No Hemosiderin Staining: No Mottled: No Mottled: No Pallor: No Pallor: No Rubor: No Rubor: No Erythema Location: N/A N/A N/A Temperature: No Abnormality No Abnormality N/A Tenderness on Palpation: Yes No N/A Wound Preparation: Ulcer Cleansing: Ulcer Cleansing: N/A Rinsed/Irrigated with Saline Rinsed/Irrigated with Saline Topical Anesthetic Applied: Topical Anesthetic Applied: Other: lidocaine 4% Other: lidiocaine 4% Procedures Performed: Compression Therapy N/A N/A Treatment Notes Wound #2 (Left, Lateral, Posterior Lower Leg) 1. Cleansed with: Clean wound with Normal Saline 2. Anesthetic Topical Lidocaine 4% cream to wound bed prior to debridement 3. Peri-wound Care: Moisturizing lotion 7. Secured with 4 Layer Compression System - Bilateral Notes silvercell, abd pad, Wound #4 (Right, Anterior Lower Leg) 1. Cleansed with: Clean wound with Normal Saline 2. Anesthetic Topical Lidocaine 4% cream to wound bed prior to debridement 3. Peri-wound Care: Moisturizing lotion 7. Secured with 4 Layer Compression System - Bilateral Notes Weikel, Jedaiah A. ([id number]) silvercell, abd pad, Wound #5 (Right, Medial Lower Leg) 1. Cleansed with: Clean wound with Normal Saline 2. Anesthetic Topical Lidocaine 4% cream to wound bed prior to debridement 3. Peri-wound Care: Moisturizing lotion 7. Secured with 4 Layer Compression System - Bilateral Notes silvercell, abd pad, Wound #6 (Right, Lateral Lower Leg) 1. Cleansed with: Clean wound with Normal Saline 2. Anesthetic Topical Lidocaine 4% cream to wound bed prior to debridement 3.  Peri-wound Care: Moisturizing lotion 7. Secured with 4 Layer Compression System - Bilateral Notes silvercell, abd pad, Electronic  Signature(s) Signed: 02/08/2018 4:49:36 PM By: Linton Ham MD Entered By: Linton Ham on 02/08/2018 11:37:49 Hacking, Mykale AMarland Kitchen ([id number]) -------------------------------------------------------------------------------- Multi-Disciplinary Care Plan Details Patient Name: Mitchell, Zalmen A. Date of Service: 02/08/2018 10:00 AM Medical Record Number: [id number] Patient Account Number: [id number] Date of Birth/Sex: 02-09-1971 (47 y.o. M) Treating RN: Cornell Barman Primary Care Anahis Furgeson: Gaetano Net Other Clinician: Referring Shawnise Peterkin: Gaetano Net Treating Joi Leyva/Extender: Tito Dine in Treatment: 6 Active Inactive ` Abuse / Safety / Falls / Self Care Management Nursing Diagnoses: Potential for falls Goals: Patient will remain injury free related to falls Date Initiated: 12/28/2017 Target Resolution Date: 04/08/2018 Goal Status: Active Interventions: Assess fall risk on admission and as needed Notes: ` Nutrition Nursing Diagnoses: Impaired glucose control: actual or potential Goals: Patient/caregiver verbalizes understanding of need to maintain therapeutic glucose control per primary care physician Date Initiated: 12/28/2017 Target Resolution Date: 04/08/2018 Goal Status: Active Interventions: Provide education on elevated blood sugars and impact on wound healing Notes: ` Orientation to the Wound Care Program Nursing Diagnoses: Knowledge deficit related to the wound healing center program Goals: Patient/caregiver will verbalize understanding of the [facility] [place] [facility] Program Date Initiated: 12/28/2017 Target Resolution Date: 04/08/2018 Goal Status: Active Interventions: Strada, Keiton A. ([id number]) Provide education on orientation to the wound center Notes: ` Wound/Skin Impairment Nursing Diagnoses: Impaired tissue  integrity Goals: Ulcer/skin breakdown will heal within 14 weeks Date Initiated: 12/28/2017 Target Resolution Date: 04/08/2018 Goal Status: Active Interventions: Assess patient/caregiver ability to obtain necessary supplies Assess patient/caregiver ability to perform ulcer/skin care regimen upon admission and as needed Assess ulceration(s) every visit Notes: Electronic Signature(s) Signed: 02/09/2018 8:47:48 AM By: Gretta Cool, BSN, RN, CWS, Kim RN, BSN Entered By: Gretta Cool, BSN, RN, CWS, Kim on 02/08/2018 11:14:00 Lowdermilk, Pravin A. ([id number]) -------------------------------------------------------------------------------- Pain Assessment Details Patient Name: Mishkin, Ashley A. Date of Service: 02/08/2018 10:00 AM Medical Record Number: [id number] Patient Account Number: [id number] Date of Birth/Sex: Sep 14, 1970 (47 y.o. M) Treating RN: Roger Shelter Primary Care Joyell Emami: Gaetano Net Other Clinician: Referring Kijuana Ruppel: Gaetano Net Treating T[place]ia Harral/Extender: Tito Dine in Treatment: 6 Active Problems Location of Pain Severity and Description of Pain Patient Has Paino No Site Locations Pain Management and Medication Current Pain Management: Electronic Signature(s) Signed: 02/08/2018 4:05:56 PM By: Roger Shelter Entered By: Roger Shelter on 02/08/2018 10:33:33 Brideau, Dywane AMarland Kitchen ([id number]) -------------------------------------------------------------------------------- Patient/Caregiver Education Details Patient Name: Skarda, Adelfo A. Date of Service: 02/08/2018 10:00 AM Medical Record Number: [id number] Patient Account Number: [id number] Date of Birth/Gender: Apr 13, 1971 (47 y.o. M) Treating RN: Ahmed Prima Primary Care Physician: Gaetano Net Other Clinician: Referring Physician: Gaetano Net Treating Physician/Extender: Tito Dine in Treatment: 6 Education Assessment Education Provided To: Patient Education Topics Provided Wound/Skin  Impairment: Handouts: Caring for Your Ulcer Methods: Explain/Verbal Responses: State content correctly Electronic Signature(s) Signed: 02/08/2018 3:54:16 PM By: Alric Quan Entered By: Alric Quan on 02/08/2018 11:38:19 Cline, Ahmeer A. ([id number]) -------------------------------------------------------------------------------- Wound Assessment Details Patient Name: Staver, Gaelan A. Date of Service: 02/08/2018 10:00 AM Medical Record Number: [id number] Patient Account Number: [id number] Date of Birth/Sex: [birth year] (47 y.o. M) Treating RN: Roger Shelter Primary Care Tyeisha Dinan: Gaetano Net Other Clinician: Referring Saman Giddens: Gaetano Net Treating Carleta Woodrow/Extender: Tito Dine in Treatment: 6 Wound Status Wound Number: 1 Primary Etiology: Diabetic Wound/Ulcer of the Lower Extremity Wound Location: Left, Proximal, Medial Lower Leg Secondary Lymphedema Wounding Event: Gradually Appeared Etiology: Date Acquired: 09/30/2017 Wound Status: Healed - Epithelialized Weeks Of Treatment: 6 Clustered Wound: No Photos Photo Uploaded By: Roger Shelter on  02/08/2018 15:57:22 Wound Measurements Length: (cm) 0 % Width: (cm) 0 % Depth: (cm) 0 Area: (cm) 0 Volume: (cm) 0 Reduction in Area: 100% Reduction in Volume: 100% Wound Description Classification: Grade 2 Periwound Skin Texture Texture Color No Abnormalities Noted: No No Abnormalities Noted: No Moisture No Abnormalities Noted: No Electronic Signature(s) Signed: 02/08/2018 4:05:56 PM By: Roger Shelter Entered By: Roger Shelter on 02/08/2018 10:49:59 Foote, Santonio A. ([id number]) -------------------------------------------------------------------------------- Wound Assessment Details Patient Name: Bagby, Diogenes A. Date of Service: 02/08/2018 10:00 AM Medical Record Number: [id number] Patient Account Number: [id number] Date of Birth/Sex: 1971-07-25 (47 y.o. M) Treating RN: Roger Shelter Primary  Care Jaymeson Mengel: Gaetano Net Other Clinician: Referring Gayathri Futrell: Gaetano Net Treating Thao Bauza/Extender: Tito Dine in Treatment: 6 Wound Status Wound Number: 2 Primary Etiology: Diabetic Wound/Ulcer of the Lower Extremity Wound Location: Left Lower Leg - Lateral, Posterior Secondary Lymphedema Wounding Event: Gradually Appeared Etiology: Date Acquired: 09/30/2017 Wound Status: Open Weeks Of Treatment: 6 Comorbid History: Hypertension, Type II Diabetes, Clustered Wound: No Neuropathy Photos Photo Uploaded By: Roger Shelter on 02/08/2018 15:57:23 Wound Measurements Length: (cm) 3 Width: (cm) 4.7 Depth: (cm) 0.3 Area: (cm) 11.074 Volume: (cm) 3.322 % Reduction in Area: 60.8% % Reduction in Volume: 70.6% Epithelialization: None Tunneling: No Undermining: No Wound Description Classification: Grade 2 Wound Margin: Distinct, outline attached Exudate Amount: Large Exudate Type: Purulent Exudate Color: yellow, brown, green Foul Odor After Cleansing: Yes Due to Product Use: No Slough/Fibrino Yes Wound Bed Granulation Amount: Medium (34-66%) Exposed Structure Granulation Quality: Red, Pink Fascia Exposed: No Necrotic Amount: Medium (34-66%) Fat Layer (Subcutaneous Tissue) Exposed: Yes Necrotic Quality: Adherent Slough Tendon Exposed: No Muscle Exposed: No Joint Exposed: No Bone Exposed: No Periwound Skin Texture Kutsch, Ananth A. ([id number]) Texture Color No Abnormalities Noted: No No Abnormalities Noted: No Callus: No Atrophie Blanche: No Crepitus: No Cyanosis: No Excoriation: No Ecchymosis: No Induration: No Erythema: Yes Rash: No Erythema Location: Circumferential Scarring: No Hemosiderin Staining: No Mottled: No Moisture Pallor: No No Abnormalities Noted: No Rubor: No Dry / Scaly: No Maceration: Yes Temperature / Pain Temperature: No Abnormality Tenderness on Palpation: Yes Wound Preparation Ulcer Cleansing: Rinsed/Irrigated  with Saline, Other: soap and water, Topical Anesthetic Applied: Other: lidocaine 4%, Treatment Notes Wound #2 (Left, Lateral, Posterior Lower Leg) 1. Cleansed with: Clean wound with Normal Saline 2. Anesthetic Topical Lidocaine 4% cream to wound bed prior to debridement 3. Peri-wound Care: Moisturizing lotion 7. Secured with 4 Layer Compression System - Bilateral Notes silvercell, abd pad, Electronic Signature(s) Signed: 02/08/2018 4:05:56 PM By: Roger Shelter Entered By: Roger Shelter on 02/08/2018 11:04:41 Shidler, Sire A. ([id number]) -------------------------------------------------------------------------------- Wound Assessment Details Patient Name: Waguespack, Talis A. Date of Service: 02/08/2018 10:00 AM Medical Record Number: [id number] Patient Account Number: [id number] Date of Birth/Sex: [birth year] (47 y.o. M) Treating RN: Roger Shelter Primary Care Jadie Allington: Gaetano Net Other Clinician: Referring Baylea Milburn: Gaetano Net Treating Gunther Zawadzki/Extender: Tito Dine in Treatment: 6 Wound Status Wound Number: 4 Primary Etiology: Lymphedema Wound Location: Right Lower Leg - Anterior Wound Status: Open Wounding Event: Gradually Appeared Comorbid Hypertension, Type II Diabetes, History: Neuropathy Date Acquired: 01/25/2018 Weeks Of Treatment: 2 Clustered Wound: No Photos Photo Uploaded By: Roger Shelter on 02/08/2018 15:58:37 Wound Measurements Length: (cm) 2.5 Width: (cm) 5.5 Depth: (cm) 0.1 Area: (cm) 10.799 Volume: (cm) 1.08 % Reduction in Area: -22876.6% % Reduction in Volume: -21500% Epithelialization: None Tunneling: No Undermining: No Wound Description Full Thickness Without Exposed Support Classification: Structures Wound Margin: Distinct, outline attached Exudate Large Amount: Exudate  Type: Purulent Exudate Color: yellow, brown, green Slough/Fibrino Yes Wound Bed Granulation Amount: Medium (34-66%) Exposed  Structure Granulation Quality: Red Fascia Exposed: No Necrotic Amount: Medium (34-66%) Fat Layer (Subcutaneous Tissue) Exposed: No Necrotic Quality: Adherent Slough Tendon Exposed: No Muscle Exposed: No Joint Exposed: No Bone Exposed: No Haik, Jayten A. ([id number]) Periwound Skin Texture Texture Color No Abnormalities Noted: No No Abnormalities Noted: No Callus: No Atrophie Blanche: No Crepitus: No Cyanosis: No Excoriation: No Ecchymosis: No Induration: No Erythema: No Rash: No Hemosiderin Staining: No Scarring: No Mottled: No Pallor: No Moisture Rubor: No No Abnormalities Noted: No Dry / Scaly: No Temperature / Pain Maceration: No Temperature: No Abnormality Tenderness on Palpation: Yes Wound Preparation Ulcer Cleansing: Rinsed/Irrigated with Saline Topical Anesthetic Applied: Other: lidocaine 4%, Treatment Notes Wound #4 (Right, Anterior Lower Leg) 1. Cleansed with: Clean wound with Normal Saline 2. Anesthetic Topical Lidocaine 4% cream to wound bed prior to debridement 3. Peri-wound Care: Moisturizing lotion 7. Secured with 4 Layer Compression System - Bilateral Notes silvercell, abd pad, Electronic Signature(s) Signed: 02/08/2018 4:05:56 PM By: Roger Shelter Entered By: Roger Shelter on 02/08/2018 11:03:00 [place], Rayvon A. ([id number]) -------------------------------------------------------------------------------- Wound Assessment Details Patient Name: Brinkmeyer, Kyel A. Date of Service: 02/08/2018 10:00 AM Medical Record Number: [id number] Patient Account Number: [id number] Date of Birth/Sex: Dec 09, 1970 (47 y.o. M) Treating RN: Roger Shelter Primary Care Kimiyah Blick: Gaetano Net Other Clinician: Referring Brysen Shankman: Gaetano Net Treating Makella Buckingham/Extender: Tito Dine in Treatment: 6 Wound Status Wound Number: 5 Primary Etiology: Lymphedema Wound Location: Right Lower Leg - Medial Secondary Diabetic Wound/Ulcer of the  Lower Etiology: Extremity Wounding Event: Blister Wound Status: Open Date Acquired: 02/01/2018 Comorbid History: Hypertension, Type II Diabetes, Weeks Of Treatment: 1 Neuropathy Clustered Wound: Yes Photos Photo Uploaded By: Roger Shelter on 02/08/2018 15:58:38 Wound Measurements Length: (cm) 7.5 Width: (cm) 4.5 Depth: (cm) 0.1 Area: (cm) 26.507 Volume: (cm) 2.651 % Reduction in Area: -145.5% % Reduction in Volume: -145.5% Epithelialization: None Tunneling: No Undermining: No Wound Description Full Thickness Without Exposed Support Classification: Structures Wound Margin: Flat and Intact Exudate Large Amount: Exudate Type: Serous Exudate Color: amber Foul Odor After Cleansing: No Slough/Fibrino Yes Wound Bed Granulation Amount: Medium (34-66%) Exposed Structure Granulation Quality: Red Fascia Exposed: No Necrotic Amount: Medium (34-66%) Fat Layer (Subcutaneous Tissue) Exposed: Yes Necrotic Quality: Adherent Slough Tendon Exposed: No Muscle Exposed: No Joint Exposed: No Bone Exposed: No Kok, Darian A. ([id number]) Periwound Skin Texture Texture Color No Abnormalities Noted: No No Abnormalities Noted: No Callus: No Atrophie Blanche: No Crepitus: No Cyanosis: No Excoriation: Yes Ecchymosis: No Induration: No Erythema: No Rash: No Hemosiderin Staining: No Scarring: No Mottled: No Pallor: No Moisture Rubor: No No Abnormalities Noted: No Dry / Scaly: No Temperature / Pain Maceration: No Temperature: No Abnormality Tenderness on Palpation: Yes Wound Preparation Ulcer Cleansing: Rinsed/Irrigated with Saline Topical Anesthetic Applied: Other: lidocaine 4%, Treatment Notes Wound #5 (Right, Medial Lower Leg) 1. Cleansed with: Clean wound with Normal Saline 2. Anesthetic Topical Lidocaine 4% cream to wound bed prior to debridement 3. Peri-wound Care: Moisturizing lotion 7. Secured with 4 Layer Compression System -  Bilateral Notes silvercell, abd pad, Electronic Signature(s) Signed: 02/08/2018 4:05:56 PM By: Roger Shelter Entered By: Roger Shelter on 02/08/2018 11:03:45 Beitler, Moksh A. ([id number]) -------------------------------------------------------------------------------- Wound Assessment Details Patient Name: Tweten, Pheng A. Date of Service: 02/08/2018 10:00 AM Medical Record Number: [id number] Patient Account Number: [id number] Date of Birth/Sex: December 13, 1970 (47 y.o. M) Treating RN: Roger Shelter Primary Care Recia Sons: Gaetano Net Other Clinician: Referring  Narely Nobles: Gaetano Net Treating Maryann Mccall/Extender: Tito Dine in Treatment: 6 Wound Status Wound Number: 6 Primary Etiology: Lymphedema Wound Location: Right Lower Leg - Lateral Wound Status: Open Wounding Event: Gradually Appeared Comorbid Hypertension, Type II Diabetes, History: Neuropathy Date Acquired: 02/08/2018 Weeks Of Treatment: 0 Clustered Wound: No Photos Photo Uploaded By: Roger Shelter on 02/08/2018 15:59:12 Wound Measurements Length: (cm) 1 Width: (cm) 1 Depth: (cm) 0.1 Area: (cm) 0.785 Volume: (cm) 0.079 % Reduction in Area: 0% % Reduction in Volume: 0% Epithelialization: None Tunneling: No Undermining: No Wound Description Full Thickness Without Exposed Support Foul Classification: Structures Slou Wound Margin: Distinct, outline attached Exudate Large Amount: Exudate Type: Serous Exudate Color: amber Odor After Cleansing: No gh/Fibrino No Wound Bed Granulation Amount: Large (67-100%) Exposed Structure Granulation Quality: Red Fascia Exposed: No Necrotic Amount: None Present (0%) Fat Layer (Subcutaneous Tissue) Exposed: Yes Tendon Exposed: No Muscle Exposed: No Joint Exposed: No Bone Exposed: No Crittendon, Olman A. ([id number]) Periwound Skin Texture Texture Color No Abnormalities Noted: No No Abnormalities Noted: No Callus: No Atrophie Blanche: No Crepitus:  No Cyanosis: No Excoriation: No Ecchymosis: No Induration: No Erythema: No Rash: No Hemosiderin Staining: No Scarring: No Mottled: No Pallor: No Moisture Rubor: No No Abnormalities Noted: No Dry / Scaly: No Temperature / Pain Maceration: No Temperature: No Abnormality Wound Preparation Ulcer Cleansing: Rinsed/Irrigated with Saline Topical Anesthetic Applied: Other: lidiocaine 4%, Treatment Notes Wound #6 (Right, Lateral Lower Leg) 1. Cleansed with: Clean wound with Normal Saline 2. Anesthetic Topical Lidocaine 4% cream to wound bed prior to debridement 3. Peri-wound Care: Moisturizing lotion 7. Secured with 4 Layer Compression System - Bilateral Notes silvercell, abd pad, Electronic Signature(s) Signed: 02/08/2018 4:05:56 PM By: Roger Shelter Entered By: Roger Shelter on 02/08/2018 11:05:02 Masten, Eldred A. ([id number]) -------------------------------------------------------------------------------- Vitals Details Patient Name: Trampe, Devarion A. Date of Service: 02/08/2018 10:00 AM Medical Record Number: [id number] Patient Account Number: [id number] Date of Birth/Sex: [birth year] (47 y.o. M) Treating RN: Roger Shelter Primary Care Clemon Devaul: Gaetano Net Other Clinician: Referring Daemon Dowty: Gaetano Net Treating Ivette Castronova/Extender: Tito Dine in Treatment: 6 Vital Signs Time Taken: 10:33 Temperature (F): 98.2 Height (in): 76 Pulse (bpm): 58 Weight (lbs): 366.5 Respiratory Rate (breaths/min): 20 Body Mass Index (BMI): 44.6 Blood Pressure (mmHg): 168/80 Reference Range: 80 - 120 mg / dl Electronic Signature(s) Signed: 02/08/2018 4:05:56 PM By: Roger Shelter Entered By: Roger Shelter on 02/08/2018 10:34:28

## 2018-02-15 ENCOUNTER — Encounter: Payer: BLUE CROSS/BLUE SHIELD | Admitting: Internal Medicine

## 2018-02-15 DIAGNOSIS — E11622 Type 2 diabetes mellitus with other skin ulcer: Secondary | ICD-10-CM | POA: Diagnosis not present

## 2018-02-16 ENCOUNTER — Ambulatory Visit (INDEPENDENT_AMBULATORY_CARE_PROVIDER_SITE_OTHER): Payer: BLUE CROSS/BLUE SHIELD | Admitting: Vascular Surgery

## 2018-02-17 NOTE — Progress Notes (Signed)
[name] [name] ([id number]) Visit Report for 02/15/2018 HPI Details Patient Name: [name], [name] Date of Service: 02/15/2018 10:30 AM Medical Record Number: [id number] Patient Account Number: [id number] Date of Birth/Sex: 1971-05-13 (47 y.o. M) Treating RN: Cornell Barman Primary Care Provider: Gaetano Net Other Clinician: Referring Provider: Gaetano Net Treating Provider/Extender: Tito Dine in Treatment: 7 History of Present Illness HPI Description: 12/28/17; this is a now 47 year old man who is a type II diabetic. He was hospitalized from 10/01/17 through 10/19/17. He had an MSSA soft tissue and skin infection. 2 open areas on the left leg were identified he has a smaller area on the left medial calf superiorly just below the knee and a wound just above the left ankle on the posterior medial aspect. I think both of these were surgical IandD sites when he was in the hospital. He was discharged with a wound VAC at that point however this is since been taken off. He follows with Dr. Ola Spurr for the [facility] and he is still on chronic Keflex at 500 twice a day. At that time he was hospitalized his hemoglobin A1c was 15.1 however if I'm reading his endocrinologist notes correctly that is improved. He has been following with Dr. [name] at vein and vascular and he has been applying calcium alginate and Unna boots. He has home health changing the dressing. They have also been attempting to get him external compression pumps although the patient is unaware whether they've been approved by insurance at this point. as mentioned he has a smaller clean wound on the right lateral calf just below the knee and he has a much larger area just above the left ankle medially and posteriorly. Our intake nurse reported greenish purulent looking drainage.the patient did have surgical material sent to pathology in February. This showed chronic abscess The patient also has lymphedema stage III in the left  greater than right lower extremities. He has a history of blisters with wounds but these of all were always healed. The patient thinks that the lymphedema may have been present since he was about 47 years old i.e. about 30 years. He does not have graded pressure stockings and has not worn stockings. He does not have a distant history of DVT PE or phlebitis. He has not been systemically unwell fever no chills. He states that his Lasix is recently been reduced. He tells me his kidney function is at "30%" and he has been followed by Dr. [name] of nephrology. At one point he was on Lasix 80 twice a day however that's been cut back and he is now on Lasix at 20 twice a day. The patient has a history of PAD listed in his records although he comes from Dr. [name] I don't think is felt to have significant PAD. ABIs in our clinic were noncompressible bilaterally. 01/04/18; patient has a large wound on the left lateral lower calf and a small wound on the left medial upper calf. He has been to see his nephrologist who changed him to Demadex 40 mg a day. I'm hopeful this will help with his systemic fluid overload. He also has stage III lymphedema. Really no change in the 2 wounds since last week 01/11/18; the patient is down 13 pounds. He put his stage III lymphedema left leg in 4K compression last week and there is less edema fluid however we still haven't been able to communicate with home health but apparently it is [facility] but the dressings have not been changed. The  patient noted an odor last week. He is also had compression pumps ordered by [facility] this as a not completed the paperwork stage. 01/18/18; patient continues to lose weight. Stage III lymphedema in the left greater than right leg under for alert compression. The major wound is on the left lateral ankle area. He apparently has bilateral compression pumps being brought to his house, these were ordered by [facility] Notable for the fact today he had some blisters on the right anterior leg together with some skin nodules. This is no doubt secondary to severe lymphedema. 01/25/18; the patient has obtained his compression pumps and is using them per vein and vascular instructions 3 times a day for an hour. He also saw [name] of vein and vascular. He was felt to have venous insufficiency but did not suggest any intervention also improved edema. It was suggested that he have compression stockings 20-30 mm on a daily basis in addition to compression pumps. [name], [name] ([id number]) The patient arrives in clinic today with a layer of unna under for layer compression. he seems to have some trouble with the degree of compression. He has open areas on the left lateral ankle area which is his major wound left upper medial calf and a superficial open area on the right anterior shin area which was blistered last week. He has skin changes on the right anterior calf which I think are no doubt secondary to lymphedema skin nodules etc. 02/01/18; the patient comes in telling us his nephrologist have to his torsemide. Unfortunately today he is put on 7 pounds by our scales. He has blisters all over the anterior and medial part of his right calf and a new open wound. He also has soupy green drainage coming out of the left lateral calf /ankle wound. 02/08/18; culture I did last week of the left lateral ankle wound grew both Pseudomonas and Morganella. He is on Keflex from Dr. Ola Spurr in the hospital. I will need to review these notes.in any case Keflex is not going to cover these 2 organisms. I'm probably going to added ciprofloxacin today for 1 week. A lot of drainage that looks purulent last week. He is not complaining of pain however he has managed to put on 10 pounds in 2 weeks by our scales in this clinic. He is going to see his nephrologist tomorrow 02/15/18; he completed the ciprofloxacin I gave him last week.  Notable that he is up to 379 pounds today which is up 16 pounds from 2 weeks ago. He is complaining of orthopnea but doesn't have any chest pain. Electronic Signature(s) Signed: 02/15/2018 4:07:49 PM By: Linton Ham MD Entered By: Linton Ham on 02/15/2018 12:42:51 Prell, Heather AMarland Kitchen ([id number]) -------------------------------------------------------------------------------- Physical Exam Details Patient Name: [name], [name] Date of Service: 02/15/2018 10:30 AM Medical Record Number: [id number] Patient Account Number: [id number] Date of Birth/Sex: [birth year] (47 y.o. M) Treating RN: Cornell Barman Primary Care Provider: Gaetano Net Other Clinician: Referring Provider: Gaetano Net Treating Provider/Extender: Tito Dine in Treatment: 7 Constitutional Patient is hypertensive.. Pulse regular and within target range for patient.[name] Respirations regular, non-labored and within target range.. Temperature is normal and within the target range for the patient.[name] appears in no distress. Respiratory Respiratory effort is easy and symmetric bilaterally. Rate is normal at rest and on room air.. shallow but otherwise clear in i. Cardiovascular Heart rhythm and rate regular, without murmur or gallop.however jugular venous pressure is elevated to the angle  of the jaw. Pedal pulses palpable. severe lymphedema left greater than right this is nonpitting. No coccyx edema. Lymphatic none palpable in the popliteal or inguinal area. Integumentary (Hair, Skin) no primary skin issues are seen. Psychiatric No evidence of depression, anxiety, or agitation. Calm, cooperative, and communicative. Appropriate interactions and affect.. Notes wound exam; the medial right calf is actually closed. Anterior right calf still open but superficial. Area on the lateral left ankle is still is dominant wound although there is epithelialization here no drainage. oProximal left lateral calf small open  area. Electronic Signature(s) Signed: 02/15/2018 4:07:49 PM By: Linton Ham MD Entered By: Linton Ham on 02/15/2018 12:48:04 Vowell, [name] ([id number]) -------------------------------------------------------------------------------- Physician Orders Details Patient Name: [name], [name] Date of Service: 02/15/2018 10:30 AM Medical Record Number: [id number] Patient Account Number: [id number] Date of Birth/Sex: 1971/03/25 (47 y.o. M) Treating RN: Cornell Barman Primary Care Provider: Gaetano Net Other Clinician: Referring Provider: Gaetano Net Treating Provider/Extender: Tito Dine in Treatment: 7 Verbal / Phone Orders: No Diagnosis Coding Wound Cleansing Wound #2 Left,Lateral,Posterior Lower Leg o Clean wound with Normal Saline. Wound #4 Right,Anterior Lower Leg o Clean wound with Normal Saline. Wound #5 Right,Medial Lower Leg o Clean wound with Normal Saline. Wound #6 Right,Lateral Lower Leg o Clean wound with Normal Saline. Wound #7 Left,Proximal,Lateral Lower Leg o Clean wound with Normal Saline. Anesthetic (add to Medication List) Wound #2 Left,Lateral,Posterior Lower Leg o Topical Lidocaine 4% cream applied to wound bed prior to debridement (In Clinic Only). Wound #4 Right,Anterior Lower Leg o Topical Lidocaine 4% cream applied to wound bed prior to debridement (In Clinic Only). Wound #5 Right,Medial Lower Leg o Topical Lidocaine 4% cream applied to wound bed prior to debridement (In Clinic Only). Wound #6 Right,Lateral Lower Leg o Topical Lidocaine 4% cream applied to wound bed prior to debridement (In Clinic Only). Wound #7 Left,Proximal,Lateral Lower Leg o Topical Lidocaine 4% cream applied to wound bed prior to debridement (In Clinic Only). Skin Barriers/Peri-Wound Care Wound #2 Left,Lateral,Posterior Lower Leg o Barrier cream Wound #4 Right,Anterior Lower Leg o Barrier cream Wound #5 Right,Medial Lower Leg o Barrier  cream Wound #6 Right,Lateral Lower Leg o Barrier cream [name], [name] ([id number]) Wound #7 Left,Proximal,Lateral Lower Leg o Barrier cream Primary Wound Dressing Wound #2 Left,Lateral,Posterior Lower Leg o Silver Alginate Wound #4 Right,Anterior Lower Leg o Silver Alginate Wound #5 Right,Medial Lower Leg o Silver Alginate Wound #6 Right,Lateral Lower Leg o Silver Alginate Wound #7 Left,Proximal,Lateral Lower Leg o Silver Alginate Secondary Dressing Wound #2 Left,Lateral,Posterior Lower Leg o ABD pad o XtraSorb Wound #4 Right,Anterior Lower Leg o ABD pad o XtraSorb Wound #5 Right,Medial Lower Leg o ABD pad o XtraSorb Wound #6 Right,Lateral Lower Leg o ABD pad o XtraSorb Wound #7 Left,Proximal,Lateral Lower Leg o ABD pad o XtraSorb Dressing Change Frequency Wound #2 Left,Lateral,Posterior Lower Leg o Change Dressing Monday, Wednesday, Friday - Wednesday in clinic. Wound #4 Right,Anterior Lower Leg o Change Dressing Monday, Wednesday, Friday - Wednesday in clinic. Wound #5 Right,Medial Lower Leg o Change Dressing Monday, Wednesday, Friday - Wednesday in clinic. Wound #6 Right,Lateral Lower Leg o Change Dressing Monday, Wednesday, Friday - Wednesday in clinic. Wound #7 Left,Proximal,Lateral Lower Leg o Change Dressing Monday, Wednesday, Friday - Wednesday in clinic. [name], [name] ([id number]) Follow-up Appointments Wound #2 Left,Lateral,Posterior Lower Leg o Return Appointment in 1 week. Wound #4 Right,Anterior Lower Leg o Return Appointment in 1 week. Wound #5 Right,Medial Lower Leg o Return Appointment in 1 week. Wound #  6 Right,Lateral Lower Leg o Return Appointment in 1 week. Wound #7 Left,Proximal,Lateral Lower Leg o Return Appointment in 1 week. Edema Control Wound #2 Left,Lateral,Posterior Lower Leg o 4 Layer Compression System - Bilateral Wound #4 Right,Anterior Lower Leg o 4 Layer Compression  System - Bilateral Wound #5 Right,Medial Lower Leg o 4 Layer Compression System - Bilateral Wound #6 Right,Lateral Lower Leg o 4 Layer Compression System - Bilateral Wound #7 Left,Proximal,Lateral Lower Leg o 4 Layer Compression System - Bilateral Home Health Wound #2 [place] Visits - Monday and Friday o Home Health Nurse may visit PRN to address patientos wound care needs. o FACE TO FACE ENCOUNTER: MEDICARE and MEDICAID PATIENTS: I certify that this patient is under my care and that I had a face-to-face encounter that meets the physician face-to-face encounter requirements with this patient on this date. The encounter with the patient was in whole or in part for the following MEDICAL CONDITION: (primary reason for [place]) MEDICAL NECESSITY: I certify, that based on my findings, NURSING services are a medically necessary home health service. HOME BOUND STATUS: I certify that my clinical findings support that this patient is homebound (i.e., Due to illness or injury, pt requires aid of supportive devices such as crutches, cane, wheelchairs, walkers, the use of special transportation or the assistance of another person to leave their place of residence. There is a normal inability to leave the home and doing so requires considerable and taxing effort. Other absences are for medical reasons / religious services and are infrequent or of short duration when for other reasons). o If current dressing causes regression in wound condition, may D/C ordered dressing product/s and apply Normal Saline Moist Dressing daily until next [place] / Other MD appointment. [place] of regression in wound condition at [phone]. Wound #4 [place] Visits - Monday and Friday o Home Health Nurse may visit PRN to address patientos wound care needs. o FACE TO FACE  ENCOUNTER: MEDICARE and MEDICAID PATIENTS: I certify that this patient is under my care and that I had a face-to-face encounter that meets the physician face-to-face encounter requirements with this patient on this date. The encounter with the patient was in whole or in part for the following [place]. ([id number]) CONDITION: (primary reason for Home Healthcare) MEDICAL NECESSITY: I certify, that based on my findings, NURSING services are a medically necessary home health service. HOME BOUND STATUS: I certify that my clinical findings support that this patient is homebound (i.e., Due to illness or injury, pt requires aid of supportive devices such as crutches, cane, wheelchairs, walkers, the use of special transportation or the assistance of another person to leave their place of residence. There is a normal inability to leave the home and doing so requires considerable and taxing effort. Other absences are for medical reasons / religious services and are infrequent or of short duration when for other reasons). o If current dressing causes regression in wound condition, may D/C ordered dressing product/s and apply Normal Saline Moist Dressing daily until next [place] / Other MD appointment. [place] of regression in wound condition at [phone]. Wound #5 Right,Medial Lower Leg o [name] Visits - Monday and Friday o Home Health Nurse may visit PRN to address patientos wound care needs. o FACE TO FACE ENCOUNTER: MEDICARE and MEDICAID PATIENTS: I certify that this patient is under my care  and that I had a face-to-face encounter that meets the physician face-to-face encounter requirements with this patient on this date. The encounter with the patient was in whole or in part for the following MEDICAL CONDITION: (primary reason for [place]) MEDICAL NECESSITY: I certify, that based on my findings, NURSING services are a  medically necessary home health service. HOME BOUND STATUS: I certify that my clinical findings support that this patient is homebound (i.e., Due to illness or injury, pt requires aid of supportive devices such as crutches, cane, wheelchairs, walkers, the use of special transportation or the assistance of another person to leave their place of residence. There is a normal inability to leave the home and doing so requires considerable and taxing effort. Other absences are for medical reasons / religious services and are infrequent or of short duration when for other reasons). o If current dressing causes regression in wound condition, may D/C ordered dressing product/s and apply Normal Saline Moist Dressing daily until next [place] / Other MD appointment. [place] of regression in wound condition at [phone]. Wound #6 Right,Lateral Lower Leg o [place] Visits - Monday and Friday o Home Health Nurse may visit PRN to address patientos wound care needs. o FACE TO FACE ENCOUNTER: MEDICARE and MEDICAID PATIENTS: I certify that this patient is under my care and that I had a face-to-face encounter that meets the physician face-to-face encounter requirements with this patient on this date. The encounter with the patient was in whole or in part for the following MEDICAL CONDITION: (primary reason for [place]) MEDICAL NECESSITY: I certify, that based on my findings, NURSING services are a medically necessary home health service. HOME BOUND STATUS: I certify that my clinical findings support that this patient is homebound (i.e., Due to illness or injury, pt requires aid of supportive devices such as crutches, cane, wheelchairs, walkers, the use of special transportation or the assistance of another person to leave their place of residence. There is a normal inability to leave the home and doing so requires considerable and taxing effort. Other  absences are for medical reasons / religious services and are infrequent or of short duration when for other reasons). o If current dressing causes regression in wound condition, may D/C ordered dressing product/s and apply Normal Saline Moist Dressing daily until next [place] / Other MD appointment. [place] of regression in wound condition at [phone]. Wound #7 Left,Proximal,Lateral Lower Leg o Continue Home Health Visits - Monday and Friday o Home Health Nurse may visit PRN to address patientos wound care needs. o FACE TO FACE ENCOUNTER: MEDICARE and MEDICAID PATIENTS: I certify that this patient is under my care and that I had a face-to-face encounter that meets the physician face-to-face encounter requirements with this patient on this date. The encounter with the patient was in whole or in part for the following MEDICAL CONDITION: (primary reason for [place]) MEDICAL NECESSITY: I certify, that based on my findings, NURSING services are a medically necessary home health service. HOME BOUND STATUS: I certify that my clinical findings support that this patient is homebound (i.e., Due to illness or injury, pt requires aid of supportive devices such as crutches, cane, wheelchairs, walkers, the use of special transportation or the assistance of another person to leave their place of residence. There is a normal inability to leave the home and doing so requires considerable and taxing effort. Other absences are for medical reasons / religious  services and are infrequent or of short duration when for other reasons). [name], [name] ([id number]) o If current dressing causes regression in wound condition, may D/C ordered dressing product/s and apply Normal Saline Moist Dressing daily until next [place] / Other MD appointment. [place] of regression in wound condition at [phone]. Medications-please add to  medication list. Wound #2 Left,Lateral,Posterior Lower Leg o P.O. Antibiotics - complete antibiotics Wound #4 Right,Anterior Lower Leg o P.O. Antibiotics - complete antibiotics Wound #5 Right,Medial Lower Leg o P.O. Antibiotics - complete antibiotics Wound #6 Right,Lateral Lower Leg o P.O. Antibiotics - complete antibiotics Wound #7 Left,Proximal,Lateral Lower Leg o P.O. Antibiotics - complete antibiotics Electronic Signature(s) Signed: 02/15/2018 4:07:49 PM By: Linton Ham MD Signed: 02/15/2018 4:19:21 PM By: Gretta Cool, BSN, RN, CWS, Kim RN, BSN Entered By: Gretta Cool, BSN, RN, CWS, Kim on 02/15/2018 11:14:42 [name], [name] AMarland Kitchen ([id number]) -------------------------------------------------------------------------------- Problem List Details Patient Name: Masaki, Riot A. Date of Service: 02/15/2018 10:30 AM Medical Record Number: [id number] Patient Account Number: [id number] Date of Birth/Sex: 20-Mar-1971 (47 y.o. 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 ([id number]) -------------------------------------------------------------------------------- Progress Note Details Patient Name: [name], [name] Date of Service: 02/15/2018 10:30  AM Medical Record Number: [id number] Patient Account Number: [id number] Date of Birth/Sex: 1971-04-13 (47 y.o. M) Treating RN: Cornell Barman Primary Care Provider: Gaetano Net Other Clinician: Referring Provider: Gaetano Net Treating Provider/Extender: Tito Dine in Treatment: 7 Subjective History of Present Illness (HPI) 12/28/17; this is a now 47 year old man who is a type II diabetic. He was hospitalized from 10/01/17 through 10/19/17. He had an MSSA soft tissue and skin infection. 2 open areas on the left leg were identified he has a smaller area on the left medial calf superiorly just below the knee and a wound just above the left ankle on the posterior medial aspect. I think both of these were surgical IandD sites when he was in the hospital. He was discharged with a wound VAC at that point however this is since been taken off. He follows with Dr. Ola Spurr for the [facility] and he is still on chronic Keflex at 500 twice a day. At that time he was hospitalized his hemoglobin A1c was 15.1 however if I'm reading his endocrinologist notes correctly that is improved. He has been following with Dr. [name] at vein and vascular and he has been applying calcium alginate and Unna boots. He has home health changing the dressing. They have also been attempting to get him external compression pumps although the patient is unaware whether they've been approved by insurance at this point. as mentioned he has a smaller clean wound on the right lateral calf just below the knee and he has a much larger area just above the left ankle medially and posteriorly. Our intake nurse reported greenish purulent looking drainage.the patient did have surgical material sent to pathology in February. This  showed chronic abscess The patient also has lymphedema stage III in the left greater than right lower extremities. He has a history of blisters with wounds but these of all were always healed. The patient thinks  that the lymphedema may have been present since he was about 47 years old i.e. about 30 years. He does not have graded pressure stockings and has not worn stockings. He does not have a distant history of DVT PE or phlebitis. He has not been systemically unwell fever no chills. He states that his Lasix is recently been reduced. He tells me his kidney function is at "30%" and he has been followed by Dr. [name] of nephrology. At one point he was on Lasix 80 twice a day however that's been cut back and he is now on Lasix at 20 twice a day. The patient has a history of PAD listed in his records although he comes from Dr. [name] I don't think is felt to have significant PAD. ABIs in our clinic were noncompressible bilaterally. 01/04/18; patient has a large wound on the left lateral lower calf and a small wound on the left medial upper calf. He has been to see his nephrologist who changed him to Demadex 40 mg a day. I'm hopeful this will help with his systemic fluid overload. He also has stage III lymphedema. Really no change in the 2 wounds since last week 01/11/18; the patient is down 13 pounds. He put his stage III lymphedema left leg in 4K compression last week and there is less edema fluid however we still haven't been able to communicate with home health but apparently it is [facility] but the dressings have not been changed. The patient noted an odor last week. He is also had compression pumps ordered by [facility] this as a not completed the paperwork stage. 01/18/18; patient continues to lose weight. Stage III lymphedema in the left greater than right leg under for alert compression. The major wound is on the left lateral ankle area. He apparently has bilateral compression pumps being brought to his house, these were ordered by [facility] Notable for the fact today he had some blisters on the right anterior leg together with some skin nodules. This is no doubt secondary  to severe lymphedema. 01/25/18; the patient has obtained his compression pumps and is using them per vein and vascular instructions 3 times a day for an hour. He also saw [name]. He was felt to have venous insufficiency but did not suggest any intervention also improved edema. It was suggested that he have compression stockings 20-30 mm on a daily basis in addition to compression pumps. The patient arrives in clinic today with a layer of unna under for layer compression. he seems to have some trouble with the degree of compression. He has open areas on the left lateral ankle area which is his major wound left upper medial calf and a Baumert, Elzia A. ([id number]) superficial open area on the right anterior shin area which was blistered last week. He has skin changes on the right anterior calf which I think are no doubt secondary to lymphedema skin nodules etc. 02/01/18; the patient comes in telling us his nephrologist have to his torsemide. Unfortunately today he is put on 7 pounds by our scales. He has blisters all over the anterior and medial part of his right calf and a new open wound. He also has soupy green drainage coming out of the left  lateral calf /ankle wound. 02/08/18; culture I did last week of the left lateral ankle wound grew both Pseudomonas and Morganella. He is on Keflex from Dr. Ola Spurr in the hospital. I will need to review these notes.in any case Keflex is not going to cover these 2 organisms. I'm probably going to added ciprofloxacin today for 1 week. A lot of drainage that looks purulent last week. He is not complaining of pain however he has managed to put on 10 pounds in 2 weeks by our scales in this clinic. He is going to see his nephrologist tomorrow 02/15/18; he completed the ciprofloxacin I gave him last week. Notable that he is up to 379 pounds today which is up 16 pounds from 2 weeks ago. He is complaining of orthopnea but doesn't have any chest  pain. Objective Constitutional Patient is hypertensive.. Pulse regular and within target range for patient.[name] Respirations regular, non-labored and within target range.. Temperature is normal and within the target range for the patient.[name] appears in no distress. Vitals Time Taken: 10:34 AM, Height: 76 in, Weight: 379.5 lbs, BMI: 46.2, Temperature: 97.8 F, Pulse: 53 bpm, Respiratory Rate: 20 breaths/min, Blood Pressure: 151/80 mmHg. Respiratory Respiratory effort is easy and symmetric bilaterally. Rate is normal at rest and on room air.. shallow but otherwise clear in i. Cardiovascular Heart rhythm and rate regular, without murmur or gallop.however jugular venous pressure is elevated to the angle of the jaw. Pedal pulses palpable. severe lymphedema left greater than right this is nonpitting. No coccyx edema. Lymphatic none palpable in the popliteal or inguinal area. Psychiatric No evidence of depression, anxiety, or agitation. Calm, cooperative, and communicative. Appropriate interactions and affect.. General Notes: wound exam; the medial right calf is actually closed. Anterior right calf still open but superficial. Area on the lateral left ankle is still is dominant wound although there is epithelialization here no drainage. Proximal left lateral calf small open area. Integumentary (Hair, Skin) no primary skin issues are seen. Wound #2 status is Open. Original cause of wound was Gradually Appeared. The wound is located on the Left,Lateral,Posterior Lower Leg. The wound measures 3.2cm length x 6.4cm width x 0.3cm depth; 16.085cm^2 area and 4.825cm^3 volume. There is Fat Layer (Subcutaneous Tissue) Exposed exposed. There is no tunneling or undermining noted. There is a large amount of purulent drainage noted. Foul odor after cleansing was noted. The wound margin is distinct with the outline attached to the wound base. There is medium (34-66%) red, pink, hyper - granulation within the wound  bed. There is a medium (34-66%) amount of necrotic tissue within the wound bed including Adherent Slough. The periwound skin appearance exhibited: Maceration, Erythema. The periwound skin appearance did not exhibit: Callus, Crepitus, Excoriation, Souders, Hannah A. ([id number]) Induration, Rash, Scarring, Dry/Scaly, Atrophie Blanche, Cyanosis, Ecchymosis, Hemosiderin Staining, Mottled, Pallor, Rubor. The surrounding wound skin color is noted with erythema which is circumferential. Periwound temperature was noted as No Abnormality. The periwound has tenderness on palpation. Wound #4 status is Open. Original cause of wound was Gradually Appeared. The wound is located on the Right,Anterior Lower Leg. The wound measures 2.5cm length x 5.5cm width x 0.1cm depth; 10.799cm^2 area and 1.08cm^3 volume. There is no tunneling or undermining noted. There is a large amount of purulent drainage noted. The wound margin is distinct with the outline attached to the wound base. There is large (67-100%) red granulation within the wound bed. There is a small (1-33%) amount of necrotic tissue within the wound bed including Eschar. The periwound  skin appearance did not exhibit: Callus, Crepitus, Excoriation, Induration, Rash, Scarring, Dry/Scaly, Maceration, Atrophie Blanche, Cyanosis, Ecchymosis, Hemosiderin Staining, Mottled, Pallor, Rubor, Erythema. Periwound temperature was noted as No Abnormality. The periwound has tenderness on palpation. Wound #5 status is Open. Original cause of wound was Blister. The wound is located on the Right,Medial Lower Leg. The wound measures 0.7cm length x 0.6cm width x 0.1cm depth; 0.33cm^2 area and 0.033cm^3 volume. There is Fat Layer (Subcutaneous Tissue) Exposed exposed. There is no tunneling or undermining noted. There is a large amount of serous drainage noted. The wound margin is flat and intact. There is medium (34-66%) red granulation within the wound bed. There is a medium  (34-66%) amount of necrotic tissue within the wound bed including Adherent Slough. The periwound skin appearance exhibited: Excoriation. The periwound skin appearance did not exhibit: Callus, Crepitus, Induration, Rash, Scarring, Dry/Scaly, Maceration, Atrophie Blanche, Cyanosis, Ecchymosis, Hemosiderin Staining, Mottled, Pallor, Rubor, Erythema. Periwound temperature was noted as No Abnormality. The periwound has tenderness on palpation. Wound #6 status is Open. Original cause of wound was Gradually Appeared. The wound is located on the Right,Lateral Lower Leg. The wound measures 0.5cm length x 0.4cm width x 0.1cm depth; 0.157cm^2 area and 0.016cm^3 volume. There is Fat Layer (Subcutaneous Tissue) Exposed exposed. There is no tunneling or undermining noted. There is a large amount of serous drainage noted. The wound margin is distinct with the outline attached to the wound base. There is medium (34- 66%) pink granulation within the wound bed. There is a medium (34-66%) amount of necrotic tissue within the wound bed including Adherent Slough. The periwound skin appearance did not exhibit: Callus, Crepitus, Excoriation, Induration, Rash, Scarring, Dry/Scaly, Maceration, Atrophie Blanche, Cyanosis, Ecchymosis, Hemosiderin Staining, Mottled, Pallor, Rubor, Erythema. Periwound temperature was noted as No Abnormality. Wound #7 status is Open. Original cause of wound was Gradually Appeared. The wound is located on the Left,Proximal,Lateral Lower Leg. The wound measures 1.1cm length x 1cm width x 0.1cm depth; 0.864cm^2 area and 0.086cm^3 volume. There is no tunneling or undermining noted. There is a large amount of sanguinous drainage noted. The wound margin is flat and intact. There is large (67-100%) red granulation within the wound bed. There is a small (1-33%) amount of necrotic tissue within the wound bed including Eschar. Periwound temperature was noted as No Abnormality. The periwound has tenderness  on palpation. Assessment Active Problems ICD-10 Non-pressure chronic ulcer of left calf with necrosis of muscle Lymphedema, not elsewhere classified Chronic venous hypertension (idiopathic) with ulcer and inflammation of left lower extremity Type 2 diabetes mellitus with other skin ulcer Plan [name], [name] ([id number]) Wound Cleansing: Wound #2 Left,Lateral,Posterior Lower Leg: Clean wound with Normal Saline. Wound #4 Right,Anterior Lower Leg: Clean wound with Normal Saline. Wound #5 Right,Medial Lower Leg: Clean wound with Normal Saline. Wound #6 Right,Lateral Lower Leg: Clean wound with Normal Saline. Wound #7 Left,Proximal,Lateral Lower Leg: Clean wound with Normal Saline. Anesthetic (add to Medication List): Wound #2 Left,Lateral,Posterior Lower Leg: Topical Lidocaine 4% cream applied to wound bed prior to debridement (In Clinic Only). Wound #4 Right,Anterior Lower Leg: Topical Lidocaine 4% cream applied to wound bed prior to debridement (In Clinic Only). Wound #5 Right,Medial Lower Leg: Topical Lidocaine 4% cream applied to wound bed prior to debridement (In Clinic Only). Wound #6 Right,Lateral Lower Leg: Topical Lidocaine 4% cream applied to wound bed prior to debridement (In Clinic Only). Wound #7 Left,Proximal,Lateral Lower Leg: Topical Lidocaine 4% cream applied to wound bed prior to debridement (In Clinic Only).  Skin Barriers/Peri-Wound Care: Wound #2 Left,Lateral,Posterior Lower Leg: Barrier cream Wound #4 Right,Anterior Lower Leg: Barrier cream Wound #5 Right,Medial Lower Leg: Barrier cream Wound #6 Right,Lateral Lower Leg: Barrier cream Wound #7 Left,Proximal,Lateral Lower Leg: Barrier cream Primary Wound Dressing: Wound #2 Left,Lateral,Posterior Lower Leg: Silver Alginate Wound #4 Right,Anterior Lower Leg: Silver Alginate Wound #5 Right,Medial Lower Leg: Silver Alginate Wound #6 Right,Lateral Lower Leg: Silver Alginate Wound #7 Left,Proximal,Lateral  Lower Leg: Silver Alginate Secondary Dressing: Wound #2 Left,Lateral,Posterior Lower Leg: ABD pad XtraSorb Wound #4 Right,Anterior Lower Leg: ABD pad XtraSorb Wound #5 Right,Medial Lower Leg: ABD pad XtraSorb Wound #6 Right,Lateral Lower Leg: ABD pad XtraSorb Wound #7 Left,Proximal,Lateral Lower Leg: ABD pad [name], [name] ([id number]) XtraSorb Dressing Change Frequency: Wound #2 Left,Lateral,Posterior Lower Leg: Change Dressing Monday, Wednesday, Friday - Wednesday in clinic. Wound #4 Right,Anterior Lower Leg: Change Dressing Monday, Wednesday, Friday - Wednesday in clinic. Wound #5 Right,Medial Lower Leg: Change Dressing Monday, Wednesday, Friday - Wednesday in clinic. Wound #6 Right,Lateral Lower Leg: Change Dressing Monday, Wednesday, Friday - Wednesday in clinic. Wound #7 Left,Proximal,Lateral Lower Leg: Change Dressing Monday, Wednesday, Friday - Wednesday in clinic. Follow-up Appointments: Wound #2 Left,Lateral,Posterior Lower Leg: Return Appointment in 1 week. Wound #4 Right,Anterior Lower Leg: Return Appointment in 1 week. Wound #5 Right,Medial Lower Leg: Return Appointment in 1 week. Wound #6 Right,Lateral Lower Leg: Return Appointment in 1 week. Wound #7 Left,Proximal,Lateral Lower Leg: Return Appointment in 1 week. Edema Control: Wound #2 Left,Lateral,Posterior Lower Leg: 4 Layer Compression System - Bilateral Wound #4 Right,Anterior Lower Leg: 4 Layer Compression System - Bilateral Wound #5 Right,Medial Lower Leg: 4 Layer Compression System - Bilateral Wound #6 Right,Lateral Lower Leg: 4 Layer Compression System - Bilateral Wound #7 Left,Proximal,Lateral Lower Leg: 4 Layer Compression System - Bilateral Home Health: Wound #2 Left,Lateral,Posterior Lower Leg: Continue Home Health Visits - Monday and Friday Home Health Nurse may visit PRN to address patient s wound care needs. FACE TO FACE ENCOUNTER: MEDICARE and MEDICAID PATIENTS: I certify that  this patient is under my care and that I had a face-to-face encounter that meets the physician face-to-face encounter requirements with this patient on this date. The encounter with the patient was in whole or in part for the following MEDICAL CONDITION: (primary reason for [place]) MEDICAL NECESSITY: I certify, that based on my findings, NURSING services are a medically necessary home health service. HOME BOUND STATUS: I certify that my clinical findings support that this patient is homebound (i.e., Due to illness or injury, pt requires aid of supportive devices such as crutches, cane, wheelchairs, walkers, the use of special transportation or the assistance of another person to leave their place of residence. There is a normal inability to leave the home and doing so requires considerable and taxing effort. Other absences are for medical reasons / religious services and are infrequent or of short duration when for other reasons). If current dressing causes regression in wound condition, may D/C ordered dressing product/s and apply Normal Saline Moist Dressing daily until next [place] / Other MD appointment. [place] of regression in wound condition at [phone]. Wound #4 Right,Anterior Lower Leg: [place] Visits - Monday and Friday Home Health Nurse may visit PRN to address patient s wound care needs. FACE TO FACE ENCOUNTER: MEDICARE and MEDICAID PATIENTS: I certify that this patient is under my care and that I had a face-to-face encounter that meets the physician face-to-face encounter requirements with this patient on this date.  The encounter with the patient was in whole or in part for the following MEDICAL CONDITION: (primary reason for [place]) MEDICAL NECESSITY: I certify, that based on my findings, NURSING services are a medically necessary home health service. HOME BOUND STATUS: I certify that my clinical findings support  that this patient is homebound (i.e., Due to illness or injury, pt requires aid of supportive devices such as crutches, cane, wheelchairs, walkers, the use of special transportation or the assistance of another person to leave their place of residence. There is a normal inability to leave the [name], [name] ([id number]) home and doing so requires considerable and taxing effort. Other absences are for medical reasons / religious services and are infrequent or of short duration when for other reasons). If current dressing causes regression in wound condition, may D/C ordered dressing product/s and apply Normal Saline Moist Dressing daily until next [place] / Other MD appointment. [place] of regression in wound condition at [phone]. Wound #5 Right,Medial Lower Leg: Continue Home Health Visits - Monday and Friday Home Health Nurse may visit PRN to address patient s wound care needs. FACE TO FACE ENCOUNTER: MEDICARE and MEDICAID PATIENTS: I certify that this patient is under my care and that I had a face-to-face encounter that meets the physician face-to-face encounter requirements with this patient on this date. The encounter with the patient was in whole or in part for the following MEDICAL CONDITION: (primary reason for [place]) MEDICAL NECESSITY: I certify, that based on my findings, NURSING services are a medically necessary home health service. HOME BOUND STATUS: I certify that my clinical findings support that this patient is homebound (i.e., Due to illness or injury, pt requires aid of supportive devices such as crutches, cane, wheelchairs, walkers, the use of special transportation or the assistance of another person to leave their place of residence. There is a normal inability to leave the home and doing so requires considerable and taxing effort. Other absences are for medical reasons / religious services and are infrequent or of short duration  when for other reasons). If current dressing causes regression in wound condition, may D/C ordered dressing product/s and apply Normal Saline Moist Dressing daily until next [place] / Other MD appointment. [place] of regression in wound condition at [phone]. Wound #6 Right,Lateral Lower Leg: Continue Home Health Visits - Monday and Friday Home Health Nurse may visit PRN to address patient s wound care needs. FACE TO FACE ENCOUNTER: MEDICARE and MEDICAID PATIENTS: I certify that this patient is under my care and that I had a face-to-face encounter that meets the physician face-to-face encounter requirements with this patient on this date. The encounter with the patient was in whole or in part for the following MEDICAL CONDITION: (primary reason for [place]) MEDICAL NECESSITY: I certify, that based on my findings, NURSING services are a medically necessary home health service. HOME BOUND STATUS: I certify that my clinical findings support that this patient is homebound (i.e., Due to illness or injury, pt requires aid of supportive devices such as crutches, cane, wheelchairs, walkers, the use of special transportation or the assistance of another person to leave their place of residence. There is a normal inability to leave the home and doing so requires considerable and taxing effort. Other absences are for medical reasons / religious services and are infrequent or of short duration when for other reasons). If current dressing causes regression in wound condition, may D/C  ordered dressing product/s and apply Normal Saline Moist Dressing daily until next [place] / Other MD appointment. [place] of regression in wound condition at [phone]. Wound #7 Left,Proximal,Lateral Lower Leg: Continue Home Health Visits - Monday and Friday Home Health Nurse may visit PRN to address patient s wound care needs. FACE TO FACE  ENCOUNTER: MEDICARE and MEDICAID PATIENTS: I certify that this patient is under my care and that I had a face-to-face encounter that meets the physician face-to-face encounter requirements with this patient on this date. The encounter with the patient was in whole or in part for the following MEDICAL CONDITION: (primary reason for [place]) MEDICAL NECESSITY: I certify, that based on my findings, NURSING services are a medically necessary home health service. HOME BOUND STATUS: I certify that my clinical findings support that this patient is homebound (i.e., Due to illness or injury, pt requires aid of supportive devices such as crutches, cane, wheelchairs, walkers, the use of special transportation or the assistance of another person to leave their place of residence. There is a normal inability to leave the home and doing so requires considerable and taxing effort. Other absences are for medical reasons / religious services and are infrequent or of short duration when for other reasons). If current dressing causes regression in wound condition, may D/C ordered dressing product/s and apply Normal Saline Moist Dressing daily until next [place] / Other MD appointment. [place] of regression in wound condition at [phone]. Medications-please add to medication list.: Wound #2 Left,Lateral,Posterior Lower Leg: P.O. Antibiotics - complete antibiotics Wound #4 Right,Anterior Lower Leg: P.O. Antibiotics - complete antibiotics Wound #5 Right,Medial Lower Leg: P.O. Antibiotics - complete antibiotics Wound #6 Right,Lateral Lower Leg: P.O. Antibiotics - complete antibiotics Ulibarri, Mehtaab A. ([id number]) Wound #7 Left,Proximal,Lateral Lower Leg: P.O. Antibiotics - complete antibiotics #1 continue with silver alginate/eczema sorb/ABDs under 4 layer compression #2 I was concerned enough about the patient's shortness of breath at night that I called his nephrologist  office to report this he is going over there today for lab work. I think he may require additional diuretic dose although I think it was recently reduced because of increasing creatinine. I reported his increase in weight of 16 pounds over the last 2 weeks. Electronic Signature(s) Signed: 02/15/2018 4:07:49 PM By: Linton Ham MD Entered By: Linton Ham on 02/15/2018 12:49:54 Tutson, Cruze AMarland Kitchen ([id number]) -------------------------------------------------------------------------------- SuperBill Details Patient Name: Arciniega, Tali A. Date of Service: 02/15/2018 Medical Record Number: [id number] Patient Account Number: [id number] Date of Birth/Sex: 04-02-1971 (47 y.o. M) Treating RN: Cornell Barman Primary Care Provider: Gaetano Net Other Clinician: Referring Provider: Gaetano Net Treating Provider/Extender: Tito Dine in Treatment: 7 Diagnosis Coding ICD-10 Codes Code Description [phone] Non-pressure chronic ulcer of left calf with necrosis of muscle I89.0 Lymphedema, not elsewhere classified I87.332 Chronic venous hypertension (idiopathic) with ulcer and inflammation of left lower extremity E11.622 Type 2 diabetes mellitus with other skin ulcer L97.211 Non-pressure chronic ulcer of right calf limited to breakdown of skin Facility Procedures CPT4: Description Modifier Quantity Code [id number] BILATERAL: Application of multi-layer venous compression system; leg (below 1 knee), including ankle and foot. Physician Procedures CPT4 Code: 3329518 Description: 84166 - WC PHYS LEVEL 3 - EST PT ICD-10 Diagnosis Description L97.223 Non-pressure chronic ulcer of left calf with necrosis of mu L97.211 Non-pressure chronic ulcer of right calf limited to breakdo Modifier: scle wn of skin Quantity: 1 Electronic Signature(s) Signed: 02/15/2018 4:07:49 PM By: Dellia Nims,  Legrand Como MD Entered By: Linton Ham on 02/15/2018 12:50:34

## 2018-02-17 NOTE — Progress Notes (Addendum)
MAXINE, HUYNH ([id number]) Visit Report for 02/15/2018 Arrival Information Details Patient Name: [name], [name] Date of Service: 02/15/2018 10:30 AM Medical Record Number: [id number] Patient Account Number: [id number] Date of Birth/Sex: 02-07-1971 (47 y.o. M) Treating RN: Roger Shelter Primary Care [place] Dowd: Gaetano Net Other Clinician: Referring Khristine Verno: Gaetano Net Treating Khyra Viscuso/Extender: Tito Dine in Treatment: 7 Visit Information History Since Last Visit All ordered tests and consults were completed: No Patient Arrived: Ambulatory Added or deleted any medications: No Arrival Time: 10:33 Any new allergies or adverse reactions: No Accompanied By: mom Had a fall or experienced change in No Transfer Assistance: None activities of daily living that may affect Patient Identification Verified: Yes risk of falls: Secondary Verification Process Yes Signs or symptoms of abuse/neglect since last visito No Completed: Hospitalized since last visit: No Patient Requires Transmission-Based No Implantable device outside of the clinic excluding No Precautions: cellular tissue based products placed in the center Patient Has Alerts: Yes since last visit: Patient Alerts: DM II Pain Present Now: No noncompressible bilateral Electronic Signature(s) Signed: 02/15/2018 4:10:19 PM By: Roger Shelter Entered By: Roger Shelter on 02/15/2018 10:34:21 [name], [name] ([id number]) -------------------------------------------------------------------------------- Encounter Discharge Information Details Patient Name: [name], [name] Date of Service: 02/15/2018 10:30 AM Medical Record Number: [id number] Patient Account Number: [id number] Date of Birth/Sex: 1971-06-15 (47 y.o. M) Treating RN: Montey Hora Primary Care Lucillia Corson: Gaetano Net Other Clinician: Referring Levada Bowersox: Gaetano Net Treating Delylah Stanczyk/Extender: Tito Dine in Treatment: 7 Encounter  Discharge Information Items Discharge Condition: Stable Ambulatory Status: Ambulatory Discharge Destination: Home Transportation: Private Auto Accompanied By: Mother Schedule Follow-up Appointment: Yes Clinical Summary of Care: Electronic Signature(s) Signed: 02/15/2018 8:16:17 PM By: Montey Hora Entered By: Montey Hora on 02/15/2018 20:16:17 [name], [name] ([id number]) -------------------------------------------------------------------------------- Lower Extremity Assessment Details Patient Name: [name], [name] Date of Service: 02/15/2018 10:30 AM Medical Record Number: [id number] Patient Account Number: [id number] Date of Birth/Sex: 06/02/1971 (47 y.o. M) Treating RN: Roger Shelter Primary Care Richardo Popoff: Gaetano Net Other Clinician: Referring Latalia Etzler: Gaetano Net Treating Jemila Camille/Extender: Tito Dine in Treatment: 7 Edema Assessment Assessed: [Left: No] [Right: No] [Left: Edema] [Right: :] Calf Left: Right: Point of Measurement: 42 cm From Medial Instep 55.4 cm 43.6 cm Ankle Left: Right: Point of Measurement: 12 cm From Medial Instep 47 cm 37.4 cm Vascular Assessment Pulses: Dorsalis Pedis Palpable: [Left:Yes] [Right:Yes] Posterior Tibial Extremity colors, hair growth, and conditions: Extremity Color: [Left:Red] [Right:Red] Temperature of Extremity: [Left:Warm] [Right:Warm] Capillary Refill: [Left:< 3 seconds] [Right:< 3 seconds] Toe Nail Assessment Left: Right: Thick: Yes Yes Discolored: Yes Yes Deformed: No No Improper Length and Hygiene: Yes Yes Electronic Signature(s) Signed: 02/15/2018 4:10:19 PM By: Roger Shelter Entered By: Roger Shelter on 02/15/2018 10:43:31 [name], [name] ([id number]) -------------------------------------------------------------------------------- Multi Wound Chart Details Patient Name: [name], [name] Date of Service: 02/15/2018 10:30 AM Medical Record Number: [id number] Patient Account Number:  [id number] Date of Birth/Sex: [birth year] (47 y.o. M) Treating RN: Cornell Barman Primary Care Casidee Jann: Gaetano Net Other Clinician: Referring Kristy Catoe: Gaetano Net Treating Ringo Sherod/Extender: Tito Dine in Treatment: 7 Vital Signs Height(in): 76 Pulse(bpm): 53 Weight(lbs): 379.5 Blood Pressure(mmHg): 151/80 Body Mass Index(BMI): 46 Temperature(F): 97.8 Respiratory Rate 20 (breaths/min): Photos: [2:No Photos] [4:No Photos] [5:No Photos] Wound Location: [2:Left Lower Leg - Lateral, Posterior] [4:Right Lower Leg - Anterior] [5:Right Lower Leg - Medial] Wounding Event: [2:Gradually Appeared] [4:Gradually Appeared] [5:Blister] Primary Etiology: [2:Diabetic Wound/Ulcer of the Lower Extremity] [4:Lymphedema] [5:Lymphedema] Secondary Etiology: [2:Lymphedema] [4:N/A] [5:Diabetic Wound/Ulcer of the Lower Extremity]  Comorbid History: [2:Hypertension, Type II Diabetes, Neuropathy] [4:Hypertension, Type II Diabetes, Neuropathy] [5:Hypertension, Type II Diabetes, Neuropathy] Date Acquired: [2:09/30/2017] [4:01/25/2018] [5:02/01/2018] Weeks of Treatment: [2:7] [4:3] [5:2] Wound Status: [2:Open] [4:Open] [5:Open] Clustered Wound: [2:No] [4:No] [5:Yes] Measurements L x W x D [2:3.2x6.4x0.3] [4:2.5x5.5x0.1] [5:0.7x0.6x0.1] (cm) Area (cm) : [2:16.085] [4:10.799] [5:0.33] Volume (cm) : [2:4.825] [4:1.08] [5:0.033] % Reduction in Area: [2:43.10%] [4:-22876.60%] [5:96.90%] % Reduction in Volume: [2:57.30%] [4:-21500.00%] [5:96.90%] Classification: [2:Grade 2] [4:Full Thickness Without Exposed Support Structures] [5:Full Thickness Without Exposed Support Structures] Exudate Amount: [2:Large] [4:Large] [5:Large] Exudate Type: [2:Purulent] [4:Purulent] [5:Serous] Exudate Color: [2:yellow, brown, green] [4:yellow, brown, green] [5:amber] Foul Odor After Cleansing: [2:Yes] [4:N/A] [5:No] Odor Anticipated Due to [2:No] [4:N/A] [5:N/A] Product Use: Wound Margin: [2:Distinct, outline attached]  [4:Distinct, outline attached] [5:Flat and Intact] Granulation Amount: [2:Medium (34-66%)] [4:Large (67-100%)] [5:Medium (34-66%)] Granulation Quality: [2:Red, Pink, Hyper-granulation] [4:Red] [5:Red] Necrotic Amount: [2:Medium (34-66%)] [4:Small (1-33%)] [5:Medium (34-66%)] Necrotic Tissue: [2:Adherent Slough] [4:Eschar] [5:Adherent Slough] Exposed Structures: [2:Fat Layer (Subcutaneous Tissue) Exposed: Yes Fascia: No Tendon: No] [4:Fascia: No Fat Layer (Subcutaneous Tissue) Exposed: No Tendon: No] [5:Fat Layer (Subcutaneous Tissue) Exposed: Yes Fascia: No Tendon: No] Muscle: No Muscle: No Muscle: No Joint: No Joint: No Joint: No Bone: No Bone: No Bone: No Epithelialization: None None None Periwound Skin Texture: Excoriation: No Excoriation: No Excoriation: Yes Induration: No Induration: No Induration: No Callus: No Callus: No Callus: No Crepitus: No Crepitus: No Crepitus: No Rash: No Rash: No Rash: No Scarring: No Scarring: No Scarring: No Periwound Skin Moisture: Maceration: Yes Maceration: No Maceration: No Dry/Scaly: No Dry/Scaly: No Dry/Scaly: No Periwound Skin Color: Erythema: Yes Atrophie Blanche: No Atrophie Blanche: No Atrophie Blanche: No Cyanosis: No Cyanosis: No Cyanosis: No Ecchymosis: No Ecchymosis: No Ecchymosis: No Erythema: No Erythema: No Hemosiderin Staining: No Hemosiderin Staining: No Hemosiderin Staining: No Mottled: No Mottled: No Mottled: No Pallor: No Pallor: No Pallor: No Rubor: No Rubor: No Rubor: No Erythema Location: Circumferential N/A N/A Temperature: No Abnormality No Abnormality No Abnormality Tenderness on Palpation: Yes Yes Yes Wound Preparation: Ulcer Cleansing: Ulcer Cleansing: Ulcer Cleansing: Rinsed/Irrigated with Saline, Rinsed/Irrigated with Saline Rinsed/Irrigated with Saline Other: soap and water Topical Anesthetic Applied: Topical Anesthetic Applied: Topical Anesthetic Applied: Other: lidocaine  4% Other: lidocaine 4% Other: lidocaine 4% Wound Number: 6 7 N/A Photos: No Photos No Photos N/A Wound Location: Right Lower Leg - Lateral Left Lower Leg - Lateral, N/A Proximal Wounding Event: Gradually Appeared Gradually Appeared N/A Primary Etiology: Lymphedema Lymphedema N/A Secondary Etiology: N/A N/A N/A Comorbid History: Hypertension, Type II Hypertension, Type II N/A Diabetes, Neuropathy Diabetes, Neuropathy Date Acquired: 02/08/2018 02/15/2018 N/A Weeks of Treatment: 1 0 N/A Wound Status: Open Open N/A Clustered Wound: No No N/A Measurements L x W x D 0.5x0.4x0.1 1.1x1x0.1 N/A (cm) Area (cm) : 0.157 0.864 N/A Volume (cm) : 0.016 0.086 N/A % Reduction in Area: 80.00% N/A N/A % Reduction in Volume: 79.70% N/A N/A Classification: Full Thickness Without Full Thickness Without N/A Exposed Support Structures Exposed Support Structures Exudate Amount: Large Large N/A Exudate Type: Serous Sanguinous N/A Exudate Color: amber red N/A Foul Odor After Cleansing: No No N/A Odor Anticipated Due to N/A N/A N/A Product Use: [name], [name] Wound Margin: Distinct, outline attached Flat and Intact N/A Granulation Amount: Medium (34-66%) Large (67-100%) N/A Granulation Quality: Pink Red N/A Necrotic Amount: Medium (34-66%) Small (1-33%) N/A Necrotic Tissue: Adherent Slough Eschar N/A Exposed Structures: Fat Layer (Subcutaneous Fascia: No N/A Tissue) Exposed: Yes Fat Layer (Subcutaneous Fascia: No  Tissue) Exposed: No Tendon: No Tendon: No Muscle: No Muscle: No Joint: No Joint: No Bone: No Bone: No Epithelialization: None None N/A Periwound Skin Texture: Excoriation: No No Abnormalities Noted N/A Induration: No Callus: No Crepitus: No Rash: No Scarring: No Periwound Skin Moisture: Maceration: No No Abnormalities Noted N/A Dry/Scaly: No Periwound Skin Color: Atrophie Blanche: No No Abnormalities Noted N/A Cyanosis: No Ecchymosis: No Erythema:  No Hemosiderin Staining: No Mottled: No Pallor: No Rubor: No Erythema Location: N/A N/A N/A Temperature: No Abnormality No Abnormality N/A Tenderness on Palpation: No Yes N/A Wound Preparation: Ulcer Cleansing: Ulcer Cleansing: N/A Rinsed/Irrigated with Saline Rinsed/Irrigated with Saline Topical Anesthetic Applied: Topical Anesthetic Applied: Other: lidiocaine 4% Other: lidocaine 4% Treatment Notes Electronic Signature(s) Signed: 02/15/2018 4:07:49 PM By: Linton Ham MD Entered By: Linton Ham on 02/15/2018 12:41:49 [name], [name] AMarland Kitchen ([id number]) -------------------------------------------------------------------------------- Multi-Disciplinary Care Plan Details Patient Name: [name], [name] Date of Service: 02/15/2018 10:30 AM Medical Record Number: [id number] Patient Account Number: [id number] Date of Birth/Sex: June 02, 1971 (47 y.o. M) Treating RN: Cornell Barman Primary Care Gianny Sabino: Gaetano Net Other Clinician: Referring Heavenly Christine: Gaetano Net Treating Alverna Fawley/Extender: Tito Dine in Treatment: 7 Active Inactive ` Abuse / Safety / Falls / Self Care Management Nursing Diagnoses: Potential for falls Goals: Patient will remain injury free related to falls Date Initiated: 12/28/2017 Target Resolution Date: 04/08/2018 Goal Status: Active Interventions: Assess fall risk on admission and as needed Notes: ` Nutrition Nursing Diagnoses: Impaired glucose control: actual or potential Goals: Patient/caregiver verbalizes understanding of need to maintain therapeutic glucose control per primary care physician Date Initiated: 12/28/2017 Target Resolution Date: 04/08/2018 Goal Status: Active Interventions: Provide education on elevated blood sugars and impact on wound healing Notes: ` Orientation to the Wound Care Program Nursing Diagnoses: Knowledge deficit related to the wound healing center program Goals: Patient/caregiver will verbalize understanding of  the [facility] Date Initiated: 12/28/2017 Target Resolution Date: 04/08/2018 Goal Status: Active Interventions: [name], [name] ([id number]) Provide education on orientation to the wound center Notes: ` Wound/Skin Impairment Nursing Diagnoses: Impaired tissue integrity Goals: Ulcer/skin breakdown will heal within 14 weeks Date Initiated: 12/28/2017 Target Resolution Date: 04/08/2018 Goal Status: Active Interventions: Assess patient/caregiver ability to obtain necessary supplies Assess patient/caregiver ability to perform ulcer/skin care regimen upon admission and as needed Assess ulceration(s) every visit Notes: Electronic Signature(s) Signed: 02/15/2018 4:19:21 PM By: Gretta Cool, BSN, RN, CWS, Kim RN, BSN Entered By: Gretta Cool, BSN, RN, CWS, Kim on 02/15/2018 11:13:16 Bourbon, Isaul A. ([id number]) -------------------------------------------------------------------------------- Pain Assessment Details Patient Name: Burley, Demarious A. Date of Service: 02/15/2018 10:30 AM Medical Record Number: [id number] Patient Account Number: [id number] Date of Birth/Sex: 1970/09/03 (47 y.o. M) Treating RN: Roger Shelter Primary Care Koda Defrank: Gaetano Net Other Clinician: Referring Deshundra Waller: Gaetano Net Treating Quadir Muns/Extender: Tito Dine in Treatment: 7 Active Problems Location of Pain Severity and Description of Pain Patient Has Paino No Site Locations Pain Management and Medication Current Pain Management: Electronic Signature(s) Signed: 02/15/2018 4:10:19 PM By: Roger Shelter Entered By: Roger Shelter on 02/15/2018 10:34:28 Nadel, Froilan AMarland Kitchen ([id number]) -------------------------------------------------------------------------------- Patient/Caregiver Education Details Patient Name: Kennan, Wilkie A. Date of Service: 02/15/2018 10:30 AM Medical Record Number: [id number] Patient Account Number: [id number] Date of Birth/Gender: 01-Jun-1971 (47 y.o. M) Treating RN:  Montey Hora Primary Care Physician: Gaetano Net Other Clinician: Referring Physician: Gaetano Net Treating Physician/Extender: Tito Dine in Treatment: 7 Education Assessment Education Provided To: Patient Education Topics Provided Venous: Handouts: Other: Use your pumps Methods: Explain/Verbal Responses: State content correctly  Electronic Signature(s) Signed: 02/16/2018 10:39:50 AM By: Montey Hora Entered By: Montey Hora on 02/15/2018 20:18:00 Lokey, Kazuo AMarland Kitchen ([id number]) -------------------------------------------------------------------------------- Wound Assessment Details Patient Name: Rosa, Syrus A. Date of Service: 02/15/2018 10:30 AM Medical Record Number: [id number] Patient Account Number: [id number] Date of Birth/Sex: 09-05-1970 (47 y.o. M) Treating RN: Roger Shelter Primary Care Jese Comella: Gaetano Net Other Clinician: Referring Lisia Westbay: Gaetano Net Treating Kayle Passarelli/Extender: Tito Dine in Treatment: 7 Wound Status Wound Number: 2 Primary Etiology: Diabetic Wound/Ulcer of the Lower Extremity Wound Location: Left Lower Leg - Lateral, Posterior Secondary Lymphedema Wounding Event: Gradually Appeared Etiology: Date Acquired: 09/30/2017 Wound Status: Open Weeks Of Treatment: 7 Comorbid History: Hypertension, Type II Diabetes, Clustered Wound: No Neuropathy Photos Photo Uploaded By: Alric Quan on 02/16/2018 16:32:57 Wound Measurements Length: (cm) 3.2 Width: (cm) 6.4 Depth: (cm) 0.3 Area: (cm) 16.085 Volume: (cm) 4.825 % Reduction in Area: 43.1% % Reduction in Volume: 57.3% Epithelialization: None Tunneling: No Undermining: No Wound Description Classification: Grade 2 Wound Margin: Distinct, outline attached Exudate Amount: Large Exudate Type: Purulent Exudate Color: yellow, brown, green Foul Odor After Cleansing: Yes Due to Product Use: No Slough/Fibrino Yes Wound Bed Granulation Amount: Medium  (34-66%) Exposed Structure Granulation Quality: Red, Pink, Hyper-granulation Fascia Exposed: No Necrotic Amount: Medium (34-66%) Fat Layer (Subcutaneous Tissue) Exposed: Yes Necrotic Quality: Adherent Slough Tendon Exposed: No Muscle Exposed: No Joint Exposed: No Bone Exposed: No Periwound Skin Texture Texture Color No Abnormalities Noted: No No Abnormalities Noted: No [name], [name] ([id number]) Callus: No Atrophie Blanche: No Crepitus: No Cyanosis: No Excoriation: No Ecchymosis: No Induration: No Erythema: Yes Rash: No Erythema Location: Circumferential Scarring: No Hemosiderin Staining: No Mottled: No Moisture Pallor: No No Abnormalities Noted: No Rubor: No Dry / Scaly: No Maceration: Yes Temperature / Pain Temperature: No Abnormality Tenderness on Palpation: Yes Wound Preparation Ulcer Cleansing: Rinsed/Irrigated with Saline, Other: soap and water, Topical Anesthetic Applied: Other: lidocaine 4%, Treatment Notes Wound #2 (Left, Lateral, Posterior Lower Leg) 1. Cleansed with: Clean wound with Normal Saline Cleanse wound with antibacterial soap and water 2. Anesthetic Topical Lidocaine 4% cream to wound bed prior to debridement 3. Peri-wound Care: Moisturizing lotion 4. Dressing Applied: Calcium Alginate with Silver 5. Secondary Dressing Applied ABD Pad 7. Secured with 4 Layer Compression System - Bilateral Notes silvercell, abd pad, Electronic Signature(s) Signed: 02/15/2018 4:10:19 PM By: Roger Shelter Entered By: Roger Shelter on 02/15/2018 10:46:55 Nevels, Wilberto A. ([id number]) -------------------------------------------------------------------------------- Wound Assessment Details Patient Name: Doughman, Sanjith A. Date of Service: 02/15/2018 10:30 AM Medical Record Number: [id number] Patient Account Number: [id number] Date of Birth/Sex: 08/30/1971 (47 y.o. M) Treating RN: Roger Shelter Primary Care Trisha Morandi: Gaetano Net Other  Clinician: Referring Makala Fetterolf: Gaetano Net Treating Tiasia Weberg/Extender: Tito Dine in Treatment: 7 Wound Status Wound Number: 4 Primary Etiology: Lymphedema Wound Location: Right Lower Leg - Anterior Wound Status: Open Wounding Event: Gradually Appeared Comorbid Hypertension, Type II Diabetes, History: Neuropathy Date Acquired: 01/25/2018 Weeks Of Treatment: 3 Clustered Wound: No Photos Photo Uploaded By: Alric Quan on 02/16/2018 16:32:57 Wound Measurements Length: (cm) 2.5 Width: (cm) 5.5 Depth: (cm) 0.1 Area: (cm) 10.799 Volume: (cm) 1.08 % Reduction in Area: -22876.6% % Reduction in Volume: -21500% Epithelialization: None Tunneling: No Undermining: No Wound Description Full Thickness Without Exposed Support Classification: Structures Wound Margin: Distinct, outline attached Exudate Large Amount: Exudate Type: Purulent Exudate Color: yellow, brown, green Slough/Fibrino Yes Wound Bed Granulation Amount: Large (67-100%) Exposed Structure Granulation Quality: Red Fascia Exposed: No Necrotic Amount: Small (1-33%) Fat Layer (Subcutaneous Tissue)  Exposed: No Necrotic Quality: Eschar Tendon Exposed: No Muscle Exposed: No Joint Exposed: No Bone Exposed: No Periwound Skin Texture Texture Color Roorda, [name] ([id number]) No Abnormalities Noted: No No Abnormalities Noted: No Callus: No Atrophie Blanche: No Crepitus: No Cyanosis: No Excoriation: No Ecchymosis: No Induration: No Erythema: No Rash: No Hemosiderin Staining: No Scarring: No Mottled: No Pallor: No Moisture Rubor: No No Abnormalities Noted: No Dry / Scaly: No Temperature / Pain Maceration: No Temperature: No Abnormality Tenderness on Palpation: Yes Wound Preparation Ulcer Cleansing: Rinsed/Irrigated with Saline Topical Anesthetic Applied: Other: lidocaine 4%, Treatment Notes Wound #4 (Right, Anterior Lower Leg) 1. Cleansed with: Clean wound with Normal  Saline Cleanse wound with antibacterial soap and water 2. Anesthetic Topical Lidocaine 4% cream to wound bed prior to debridement 3. Peri-wound Care: Moisturizing lotion 4. Dressing Applied: Calcium Alginate with Silver 5. Secondary Dressing Applied ABD Pad 7. Secured with 4 Layer Compression System - Bilateral Notes silvercell, abd pad, Electronic Signature(s) Signed: 02/15/2018 4:10:19 PM By: Roger Shelter Entered By: Roger Shelter on 02/15/2018 10:49:56 [name], Glenden A. ([id number]) -------------------------------------------------------------------------------- Wound Assessment Details Patient Name: [name], [name] Date of Service: 02/15/2018 10:30 AM Medical Record Number: [id number] Patient Account Number: [id number] Date of Birth/Sex: Jul 05, 1971 (47 y.o. M) Treating RN: Roger Shelter Primary Care Adger [name]: Gaetano Net Other Clinician: Referring Timber Marshman: Gaetano Net Treating Hermila Millis/Extender: Tito Dine in Treatment: 7 Wound Status Wound Number: 5 Primary Etiology: Lymphedema Wound Location: Right Lower Leg - Medial Secondary Diabetic Wound/Ulcer of the Lower Etiology: Extremity Wounding Event: Blister Wound Status: Open Date Acquired: 02/01/2018 Comorbid History: Hypertension, Type II Diabetes, Weeks Of Treatment: 2 Neuropathy Clustered Wound: Yes Photos Photo Uploaded By: Alric Quan on 02/16/2018 16:34:14 Wound Measurements Length: (cm) 0.7 Width: (cm) 0.6 Depth: (cm) 0.1 Area: (cm) 0.33 Volume: (cm) 0.033 % Reduction in Area: 96.9% % Reduction in Volume: 96.9% Epithelialization: None Tunneling: No Undermining: No Wound Description Full Thickness Without Exposed Support Classification: Structures Wound Margin: Flat and Intact Exudate Large Amount: Exudate Type: Serous Exudate Color: amber Foul Odor After Cleansing: No Slough/Fibrino Yes Wound Bed Granulation Amount: Medium (34-66%) Exposed Structure Granulation  Quality: Red Fascia Exposed: No Necrotic Amount: Medium (34-66%) Fat Layer (Subcutaneous Tissue) Exposed: Yes Necrotic Quality: Adherent Slough Tendon Exposed: No Muscle Exposed: No Joint Exposed: No Bone Exposed: No Periwound Skin Texture Texture Color Gotwalt, [name] A. ([id number]) No Abnormalities Noted: No No Abnormalities Noted: No Callus: No Atrophie Blanche: No Crepitus: No Cyanosis: No Excoriation: Yes Ecchymosis: No Induration: No Erythema: No Rash: No Hemosiderin Staining: No Scarring: No Mottled: No Pallor: No Moisture Rubor: No No Abnormalities Noted: No Dry / Scaly: No Temperature / Pain Maceration: No Temperature: No Abnormality Tenderness on Palpation: Yes Wound Preparation Ulcer Cleansing: Rinsed/Irrigated with Saline Topical Anesthetic Applied: Other: lidocaine 4%, Treatment Notes Wound #5 (Right, Medial Lower Leg) 1. Cleansed with: Clean wound with Normal Saline Cleanse wound with antibacterial soap and water 2. Anesthetic Topical Lidocaine 4% cream to wound bed prior to debridement 3. Peri-wound Care: Moisturizing lotion 4. Dressing Applied: Calcium Alginate with Silver 5. Secondary Dressing Applied ABD Pad 7. Secured with 4 Layer Compression System - Bilateral Notes silvercell, abd pad, Electronic Signature(s) Signed: 02/15/2018 4:10:19 PM By: Roger Shelter Entered By: Roger Shelter on 02/15/2018 10:48:00 Bezdek, Axel A. ([id number]) -------------------------------------------------------------------------------- Wound Assessment Details Patient Name: Pollino, Gladys A. Date of Service: 02/15/2018 10:30 AM Medical Record Number: [id number] Patient Account Number: [id number] Date of Birth/Sex: 06/09/1971 (47 y.o. M) Treating RN: Roger Shelter Primary  Care Odean Fester: Gaetano Net Other Clinician: Referring Kofi Murrell: Gaetano Net Treating Masiah Lewing/Extender: Tito Dine in Treatment: 7 Wound Status Wound Number:  6 Primary Etiology: Lymphedema Wound Location: Right Lower Leg - Lateral Wound Status: Open Wounding Event: Gradually Appeared Comorbid Hypertension, Type II Diabetes, History: Neuropathy Date Acquired: 02/08/2018 Weeks Of Treatment: 1 Clustered Wound: No Photos Photo Uploaded By: Alric Quan on 02/16/2018 16:34:57 Wound Measurements Length: (cm) 0.5 Width: (cm) 0.4 Depth: (cm) 0.1 Area: (cm) 0.157 Volume: (cm) 0.016 % Reduction in Area: 80% % Reduction in Volume: 79.7% Epithelialization: None Tunneling: No Undermining: No Wound Description Full Thickness Without Exposed Support Foul Classification: Structures Slou Wound Margin: Distinct, outline attached Exudate Large Amount: Exudate Type: Serous Exudate Color: amber Odor After Cleansing: No gh/Fibrino No Wound Bed Granulation Amount: Medium (34-66%) Exposed Structure Granulation Quality: Pink Fascia Exposed: No Necrotic Amount: Medium (34-66%) Fat Layer (Subcutaneous Tissue) Exposed: Yes Necrotic Quality: Adherent Slough Tendon Exposed: No Muscle Exposed: No Joint Exposed: No Bone Exposed: No Periwound Skin Texture Texture Color Infinger, Tiny A. ([id number]) No Abnormalities Noted: No No Abnormalities Noted: No Callus: No Atrophie Blanche: No Crepitus: No Cyanosis: No Excoriation: No Ecchymosis: No Induration: No Erythema: No Rash: No Hemosiderin Staining: No Scarring: No Mottled: No Pallor: No Moisture Rubor: No No Abnormalities Noted: No Dry / Scaly: No Temperature / Pain Maceration: No Temperature: No Abnormality Wound Preparation Ulcer Cleansing: Rinsed/Irrigated with Saline Topical Anesthetic Applied: Other: lidiocaine 4%, Treatment Notes Wound #6 (Right, Lateral Lower Leg) 1. Cleansed with: Clean wound with Normal Saline Cleanse wound with antibacterial soap and water 2. Anesthetic Topical Lidocaine 4% cream to wound bed prior to debridement 3. Peri-wound  Care: Moisturizing lotion 4. Dressing Applied: Calcium Alginate with Silver 5. Secondary Dressing Applied ABD Pad 7. Secured with 4 Layer Compression System - Bilateral Notes silvercell, abd pad, Electronic Signature(s) Signed: 02/15/2018 4:10:19 PM By: Roger Shelter Entered By: Roger Shelter on 02/15/2018 10:45:18 Lucking, Milfred A. ([id number]) -------------------------------------------------------------------------------- Wound Assessment Details Patient Name: Nowling, Teran A. Date of Service: 02/15/2018 10:30 AM Medical Record Number: [id number] Patient Account Number: [id number] Date of Birth/Sex: October 14, 1970 (47 y.o. M) Treating RN: Roger Shelter Primary Care Shabnam Ladd: Gaetano Net Other Clinician: Referring Leemon Ayala: Gaetano Net Treating Cyan Clippinger/Extender: Tito Dine in Treatment: 7 Wound Status Wound Number: 7 Primary Etiology: Lymphedema Wound Location: Left Lower Leg - Lateral, Proximal Wound Status: Open Wounding Event: Gradually Appeared Comorbid Hypertension, Type II Diabetes, History: Neuropathy Date Acquired: 02/15/2018 Weeks Of Treatment: 0 Clustered Wound: No Photos Photo Uploaded By: Alric Quan on 02/16/2018 16:34:15 Wound Measurements Length: (cm) 1.1 Width: (cm) 1 Depth: (cm) 0.1 Area: (cm) 0.864 Volume: (cm) 0.086 % Reduction in Area: % Reduction in Volume: Epithelialization: None Tunneling: No Undermining: No Wound Description Full Thickness Without Exposed Support Foul Classification: Structures Slou Wound Margin: Flat and Intact Exudate Large Amount: Exudate Type: Sanguinous Exudate Color: red Odor After Cleansing: No gh/Fibrino Yes Wound Bed Granulation Amount: Large (67-100%) Exposed Structure Granulation Quality: Red Fascia Exposed: No Necrotic Amount: Small (1-33%) Fat Layer (Subcutaneous Tissue) Exposed: No Necrotic Quality: Eschar Tendon Exposed: No Muscle Exposed: No Joint Exposed: No Bone  Exposed: No Periwound Skin Texture Texture Color Brigante, Zale A. ([id number]) No Abnormalities Noted: No No Abnormalities Noted: No Moisture Temperature / Pain No Abnormalities Noted: No Temperature: No Abnormality Tenderness on Palpation: Yes Wound Preparation Ulcer Cleansing: Rinsed/Irrigated with Saline Topical Anesthetic Applied: Other: lidocaine 4%, Treatment Notes Wound #7 (Left, Proximal, Lateral Lower Leg) 1. Cleansed with: Clean wound  with Normal Saline Cleanse wound with antibacterial soap and water 2. Anesthetic Topical Lidocaine 4% cream to wound bed prior to debridement 3. Peri-wound Care: Moisturizing lotion 4. Dressing Applied: Calcium Alginate with Silver 5. Secondary Dressing Applied ABD Pad 7. Secured with 4 Layer Compression System - Bilateral Notes silvercell, abd pad, Electronic Signature(s) Signed: 02/15/2018 4:10:19 PM By: Roger Shelter Entered By: Roger Shelter on 02/15/2018 10:51:58 Vernon, Farooq A. ([id number]) -------------------------------------------------------------------------------- Vitals Details Patient Name: Pickert, Justun A. Date of Service: 02/15/2018 10:30 AM Medical Record Number: [id number] Patient Account Number: [id number] Date of Birth/Sex: [birth year] (47 y.o. M) Treating RN: Roger Shelter Primary Care Elyan Vanwieren: Gaetano Net Other Clinician: Referring Garris Melhorn: Gaetano Net Treating Cherice Glennie/Extender: Tito Dine in Treatment: 7 Vital Signs Time Taken: 10:34 Temperature (F): 97.8 Height (in): 76 Pulse (bpm): 53 Weight (lbs): 379.5 Respiratory Rate (breaths/min): 20 Body Mass Index (BMI): 46.2 Blood Pressure (mmHg): 151/80 Reference Range: 80 - 120 mg / dl Electronic Signature(s) Signed: 02/15/2018 4:10:19 PM By: Roger Shelter Entered By: Roger Shelter on 02/15/2018 10:35:44

## 2018-02-22 ENCOUNTER — Other Ambulatory Visit
Admission: RE | Admit: 2018-02-22 | Discharge: 2018-02-22 | Disposition: A | Discharge status: Home / Self Care | Payer: BLUE CROSS/BLUE SHIELD | Source: Ambulatory Visit | Attending: Internal Medicine | Admitting: Internal Medicine

## 2018-02-22 ENCOUNTER — Encounter: Payer: BLUE CROSS/BLUE SHIELD | Admitting: Internal Medicine

## 2018-02-22 DIAGNOSIS — B999 Unspecified infectious disease: Secondary | ICD-10-CM | POA: Insufficient documentation

## 2018-02-22 DIAGNOSIS — E11622 Type 2 diabetes mellitus with other skin ulcer: Secondary | ICD-10-CM | POA: Diagnosis not present

## 2018-02-23 ENCOUNTER — Ambulatory Visit (INDEPENDENT_AMBULATORY_CARE_PROVIDER_SITE_OTHER): Payer: BLUE CROSS/BLUE SHIELD | Admitting: Vascular Surgery

## 2018-02-23 ENCOUNTER — Encounter (INDEPENDENT_AMBULATORY_CARE_PROVIDER_SITE_OTHER): Payer: Self-pay | Admitting: Vascular Surgery

## 2018-02-23 VITALS — BP 176/83 | HR 63 | Resp 17 | Ht 76.0 in | Wt 382.8 lb

## 2018-02-23 DIAGNOSIS — I872 Venous insufficiency (chronic) (peripheral): Secondary | ICD-10-CM | POA: Diagnosis not present

## 2018-02-23 DIAGNOSIS — L97909 Non-pressure chronic ulcer of unspecified part of unspecified lower leg with unspecified severity: Secondary | ICD-10-CM | POA: Diagnosis not present

## 2018-02-23 DIAGNOSIS — E119 Type 2 diabetes mellitus without complications: Secondary | ICD-10-CM | POA: Diagnosis not present

## 2018-02-23 DIAGNOSIS — I10 Essential (primary) hypertension: Secondary | ICD-10-CM | POA: Diagnosis not present

## 2018-02-23 DIAGNOSIS — I83009 Varicose veins of unspecified lower extremity with ulcer of unspecified site: Secondary | ICD-10-CM

## 2018-02-23 DIAGNOSIS — Z0289 Encounter for other administrative examinations: Secondary | ICD-10-CM

## 2018-02-24 ENCOUNTER — Encounter (INDEPENDENT_AMBULATORY_CARE_PROVIDER_SITE_OTHER): Payer: Self-pay | Admitting: Vascular Surgery

## 2018-02-24 NOTE — Progress Notes (Addendum)
[name] ([id number]) Visit Report for 02/22/2018 HPI Details Patient Name: [name], [name] Date of Service: 02/22/2018 10:15 AM Medical Record Number: [id number] Patient Account Number: [id number] Date of Birth/Sex: [birth year] (47 y.o. M) Treating RN: [name] Primary Care Provider: Gaetano [name] Other Clinician: Referring Provider: Gaetano [name] Treating Provider/Extender: [name] in Treatment: 8 History of Present Illness HPI Description: 12/28/17; this is a now 47 year old man who is a type II diabetic. He was hospitalized from 10/01/17 through 10/19/17. He had an MSSA soft tissue and skin infection. 2 open areas on the left leg were identified he has a smaller area on the left medial calf superiorly just below the knee and a wound just above the left ankle on the posterior medial aspect. I think both of these were surgical IandD sites when he was in the hospital. He was discharged with a wound VAC at that point however this is since been taken off. He follows with [name] for the [facility] and he is still on chronic Keflex at 500 twice a day. At that time he was hospitalized his hemoglobin A1c was 15.1 however if I'm reading his endocrinologist notes correctly that is improved. He has been following with [name] at vein and vascular and he has been applying calcium alginate and Unna boots. He has home health changing the dressing. They have also been attempting to get him external compression pumps although the patient is unaware whether they've been approved by insurance at this point. as mentioned he has a smaller clean wound on the right lateral calf just below the knee and he has a much larger area just above the left ankle medially and posteriorly. Our intake nurse reported greenish purulent looking drainage.the patient did have surgical material sent to pathology in February. This showed chronic abscess The patient also has lymphedema stage III in the left  greater than right lower extremities. He has a history of blisters with wounds but these of all were always healed. The patient thinks that the lymphedema may have been present since he was about 47 years old i.e. about 30 years. He does not have graded pressure stockings and has not worn stockings. He does not have a distant history of DVT PE or phlebitis. He has not been systemically unwell fever no chills. He states that his Lasix is recently been reduced. He tells me his kidney function is at "30%" and he has been followed by [name] of nephrology. At one point he was on Lasix 80 twice a day however that's been cut back and he is now on Lasix at 20 twice a day. The patient has a history of PAD listed in his records although he comes from [name] I don't think is felt to have significant PAD. ABIs in our clinic were noncompressible bilaterally. 01/04/18; patient has a large wound on the left lateral lower calf and a small wound on the left medial upper calf. He has been to see his nephrologist who changed him to Demadex 40 mg a day. I'm hopeful this will help with his systemic fluid overload. He also has stage III lymphedema. Really no change in the 2 wounds since last week 01/11/18; the patient is down 13 pounds. He put his stage III lymphedema left leg in 4K compression last week and there is less edema fluid however we still haven't been able to communicate with home health but apparently it is [facility] but the dressings have not been changed. The  patient noted an odor last week. He is also had compression pumps ordered by [name] this as a not completed the paperwork stage. 01/18/18; patient continues to lose weight. Stage III lymphedema in the left greater than right leg under for alert compression. The major wound is on the left lateral ankle area. He apparently has bilateral compression pumps being brought to his house, these were ordered by [place] [facility] Notable for the fact today he had some blisters on the right anterior leg together with some skin nodules. This is no doubt secondary to severe lymphedema. 01/25/18; the patient has obtained his compression pumps and is using them per vein and vascular instructions 3 times a day for an hour. He also saw [name] of vein and vascular. He was felt to have venous insufficiency but did not suggest any intervention also improved edema. It was suggested that he have compression stockings 20-30 mm on a daily basis in addition to compression pumps. [name], [name] ([id number]) The patient arrives in clinic today with a layer of unna under for layer compression. he seems to have some trouble with the degree of compression. He has open areas on the left lateral ankle area which is his major wound left upper medial calf and a superficial open area on the right anterior shin area which was blistered last week. He has skin changes on the right anterior calf which I think are no doubt secondary to lymphedema skin nodules etc. 02/01/18; the patient comes in telling us his nephrologist have to his torsemide. Unfortunately today he is put on 7 pounds by our scales. He has blisters all over the anterior and medial part of his right calf and a new open wound. He also has soupy green drainage coming out of the left lateral calf /ankle wound. 02/08/18; culture I did last week of the left lateral ankle wound grew both Pseudomonas and Morganella. He is on Keflex from [name] in the hospital. I will need to review these notes.in any case Keflex is not going to cover these 2 organisms. I'm probably going to added ciprofloxacin today for 1 week. A lot of drainage that looks purulent last week. He is not complaining of pain however he has managed to put on 10 pounds in 2 weeks by our scales in this clinic. He is going to see his nephrologist tomorrow 02/15/18; he completed the ciprofloxacin I gave him last week.  Notable that he is up to 379 pounds today which is up 16 pounds from 2 weeks ago. He is complaining of orthopnea but doesn't have any chest pain. 02/22/18; he continues to have weight gain. R intake nurse reports again purulent green drainage coming out of the lateral wound on the lateral left calf. He has small open area on the right anterior leg. His torsemide was increased to 2 tablets a day I believe this is 40 mg last week in response to the call admitted to Dr. Keturah Barre office. He follows up with [name] and Dr. Delana Meyer tomorrow Electronic Signature(s) Signed: 02/22/2018 6:35:58 PM By: Linton Ham MD Entered By: Linton Ham on 02/22/2018 11:57:21 Divirgilio, [name] ([id number]) -------------------------------------------------------------------------------- Physical Exam Details Patient Name: Reif, Dirk A. Date of Service: 02/22/2018 10:15 AM Medical Record Number: [id number] Patient Account Number: [id number] Date of Birth/Sex: 1970-09-29 (47 y.o. M) Treating RN: [name] Primary Care Provider: Gaetano [name] Other Clinician: Referring Provider: Gaetano [name] Treating Provider/Extender: [name] in Treatment: 8 Constitutional Patient  is hypertensive.. Pulse regular and within target range for patient.[name] Respirations regular, non-labored and within target range.. Temperature is normal and within the target range for the patient.. does not look systemically unwell. Respiratory Respiratory effort is easy and symmetric bilaterally. Rate is normal at rest and on room air.. Bilateral breath sounds are clear and equal in all lobes with no wheezes, rales or rhonchi.. Cardiovascular heart sounds are distant JVP is elevated. There is no coccyx edema. dorsalis pedis pulses are palpable bilaterally. severe lymphedema in the left greater than right leg. No evidence of a DVT. Lymphatic unpalpable and the popliteal or inguinal area. Integumentary (Hair, Skin) no primary skin  issues are seen. Psychiatric Patient appears depressed today.. Notes wound exam; the medial right calf is actually closed. There is a area on the anterior right That is still open but is superficial, largely unchanged from last week. oThe area on the left lateral calf appears to have less overall wound area however the draining area superiorly is still open and I have recultured this. There is a lot of edema around this wound. No crepitus no erythema and no tenderness Electronic Signature(s) Signed: 02/22/2018 6:35:58 PM By: Linton Ham MD Entered By: Linton Ham on 02/22/2018 12:02:21 [name], [name] Kitchen ([id number]) -------------------------------------------------------------------------------- Physician Orders Details Patient Name: Chimenti, Marquan A. Date of Service: 02/22/2018 10:15 AM Medical Record Number: [id number] Patient Account Number: [id number] Date of Birth/Sex: 15-Dec-1970 (47 y.o. M) Treating RN: [name] Primary Care Provider: Gaetano [name] Other Clinician: Referring Provider: Gaetano [name] Treating Provider/Extender: [name] in Treatment: 8 Verbal / Phone Orders: No Diagnosis Coding Wound Cleansing Wound #4 Right,Anterior Lower Leg o Clean wound with Normal Saline. Anesthetic (add to Medication List) Wound #2 Left,Lateral,Posterior Lower Leg o Topical Lidocaine 4% cream applied to wound bed prior to debridement (In Clinic Only). Wound #4 Right,Anterior Lower Leg o Topical Lidocaine 4% cream applied to wound bed prior to debridement (In Clinic Only). Wound #5 Right,Medial Lower Leg o Topical Lidocaine 4% cream applied to wound bed prior to debridement (In Clinic Only). Wound #6 Right,Lateral Lower Leg o Topical Lidocaine 4% cream applied to wound bed prior to debridement (In Clinic Only). Wound #7 Left,Proximal,Lateral Lower Leg o Topical Lidocaine 4% cream applied to wound bed prior to debridement (In Clinic Only). Wound #8  Left,Proximal,Posterior Lower Leg o Topical Lidocaine 4% cream applied to wound bed prior to debridement (In Clinic Only). Skin Barriers/Peri-Wound Care Wound #2 Left,Lateral,Posterior Lower Leg o Barrier cream Wound #4 Right,Anterior Lower Leg o Barrier cream Wound #5 Right,Medial Lower Leg o Barrier cream Wound #6 Right,Lateral Lower Leg o Barrier cream Wound #7 Left,Proximal,Lateral Lower Leg o Barrier cream Wound #8 Left,Proximal,Posterior Lower Leg o Barrier cream Primary Wound Dressing Wound #2 Left,Lateral,Posterior Lower Leg [name], [name] ([id number]) o Silver Alginate Wound #4 Right,Anterior Lower Leg o Silver Alginate Wound #5 Right,Medial Lower Leg o Silver Alginate Wound #6 Right,Lateral Lower Leg o Silver Alginate Wound #7 Left,Proximal,Lateral Lower Leg o Silver Alginate Wound #8 Left,Proximal,Posterior Lower Leg o Silver Alginate Secondary Dressing Wound #2 Left,Lateral,Posterior Lower Leg o ABD pad o XtraSorb Wound #4 Right,Anterior Lower Leg o ABD pad o XtraSorb Wound #5 Right,Medial Lower Leg o ABD pad o XtraSorb Wound #6 Right,Lateral Lower Leg o ABD pad o XtraSorb Wound #7 Left,Proximal,Lateral Lower Leg o ABD pad o XtraSorb Wound #8 Left,Proximal,Posterior Lower Leg o ABD pad o XtraSorb Dressing Change Frequency Wound #2 Left,Lateral,Posterior Lower Leg o Change Dressing Monday, Wednesday, Friday - Wednesday  in clinic. Wound #4 Right,Anterior Lower Leg o Change Dressing Monday, Wednesday, Friday - Wednesday in clinic. Wound #5 Right,Medial Lower Leg o Change Dressing Monday, Wednesday, Friday - Wednesday in clinic. Wound #6 Right,Lateral Lower Leg o Change Dressing Monday, Wednesday, Friday - Wednesday in clinic. Wound #7 Left,Proximal,Lateral Lower Leg o Change Dressing Monday, Wednesday, Friday - Wednesday in clinic. [name], [name] ([id number]) Wound #8  Left,Proximal,Posterior Lower Leg o Change Dressing Monday, Wednesday, Friday - Wednesday in clinic. Follow-up Appointments Wound #2 Left,Lateral,Posterior Lower Leg o Return Appointment in 1 week. Wound #4 Right,Anterior Lower Leg o Return Appointment in 1 week. Wound #5 Right,Medial Lower Leg o Return Appointment in 1 week. Wound #6 Right,Lateral Lower Leg o Return Appointment in 1 week. Wound #7 Left,Proximal,Lateral Lower Leg o Return Appointment in 1 week. Wound #8 Left,Proximal,Posterior Lower Leg o Return Appointment in 1 week. Edema Control Wound #2 Left,Lateral,Posterior Lower Leg o 4 Layer Compression System - Bilateral o Elevate legs to the level of the heart and pump ankles as often as possible o Compression Pump: Use compression pump on left lower extremity for 30 minutes, twice daily. o Compression Pump: Use compression pump on right lower extremity for 30 minutes, twice daily. Wound #4 Right,Anterior Lower Leg o 4 Layer Compression System - Bilateral o Elevate legs to the level of the heart and pump ankles as often as possible o Compression Pump: Use compression pump on left lower extremity for 30 minutes, twice daily. o Compression Pump: Use compression pump on right lower extremity for 30 minutes, twice daily. Wound #5 Right,Medial Lower Leg o 4 Layer Compression System - Bilateral o Elevate legs to the level of the heart and pump ankles as often as possible o Compression Pump: Use compression pump on left lower extremity for 30 minutes, twice daily. o Compression Pump: Use compression pump on right lower extremity for 30 minutes, twice daily. Wound #6 Right,Lateral Lower Leg o 4 Layer Compression System - Bilateral o Elevate legs to the level of the heart and pump ankles as often as possible o Compression Pump: Use compression pump on left lower extremity for 30 minutes, twice daily. o Compression Pump: Use compression  pump on right lower extremity for 30 minutes, twice daily. Wound #7 Left,Proximal,Lateral Lower Leg o 4 Layer Compression System - Bilateral o Elevate legs to the level of the heart and pump ankles as often as possible o Compression Pump: Use compression pump on left lower extremity for 30 minutes, twice daily. o Compression Pump: Use compression pump on right lower extremity for 30 minutes, twice daily. Wound #8 Left,Proximal,Posterior Lower Leg o 4 Layer Compression System - Bilateral [name], [name] ([id number]) o Elevate legs to the level of the heart and pump ankles as often as possible o Compression Pump: Use compression pump on left lower extremity for 30 minutes, twice daily. o Compression Pump: Use compression pump on right lower extremity for 30 minutes, twice daily. Home Health Wound #2 [place] Visits - Monday and Friday o Home Health Nurse may visit PRN to address patientos wound care needs. o FACE TO FACE ENCOUNTER: MEDICARE and MEDICAID PATIENTS: I certify that this patient is under my care and that I had a face-to-face encounter that meets the physician face-to-face encounter requirements with this patient on this date. The encounter with the patient was in whole or in part for the following MEDICAL CONDITION: (primary reason for [place]) MEDICAL NECESSITY: I certify, that based on  my findings, NURSING services are a medically necessary home health service. HOME BOUND STATUS: I certify that my clinical findings support that this patient is homebound (i.e., Due to illness or injury, pt requires aid of supportive devices such as crutches, cane, wheelchairs, walkers, the use of special transportation or the assistance of another person to leave their place of residence. There is a normal inability to leave the home and doing so requires considerable and taxing effort. Other absences are for medical  reasons / religious services and are infrequent or of short duration when for other reasons). o If current dressing causes regression in wound condition, may D/C ordered dressing product/s and apply Normal Saline Moist Dressing daily until next [place] / Other MD appointment. [place] of regression in wound condition at [phone]. Wound #4 [place] Visits - Monday and Friday o Home Health Nurse may visit PRN to address patientos wound care needs. o FACE TO FACE ENCOUNTER: MEDICARE and MEDICAID PATIENTS: I certify that this patient is under my care and that I had a face-to-face encounter that meets the physician face-to-face encounter requirements with this patient on this date. The encounter with the patient was in whole or in part for the following MEDICAL CONDITION: (primary reason for [place]) MEDICAL NECESSITY: I certify, that based on my findings, NURSING services are a medically necessary home health service. HOME BOUND STATUS: I certify that my clinical findings support that this patient is homebound (i.e., Due to illness or injury, pt requires aid of supportive devices such as crutches, cane, wheelchairs, walkers, the use of special transportation or the assistance of another person to leave their place of residence. There is a normal inability to leave the home and doing so requires considerable and taxing effort. Other absences are for medical reasons / religious services and are infrequent or of short duration when for other reasons). o If current dressing causes regression in wound condition, may D/C ordered dressing product/s and apply Normal Saline Moist Dressing daily until next [place] / Other MD appointment. [place] of regression in wound condition at [phone]. Wound #5 Right,Medial Lower Leg o [place] Visits - Monday and Friday o  Home Health Nurse may visit PRN to address patientos wound care needs. o FACE TO FACE ENCOUNTER: MEDICARE and MEDICAID PATIENTS: I certify that this patient is under my care and that I had a face-to-face encounter that meets the physician face-to-face encounter requirements with this patient on this date. The encounter with the patient was in whole or in part for the following MEDICAL CONDITION: (primary reason for [place]) MEDICAL NECESSITY: I certify, that based on my findings, NURSING services are a medically necessary home health service. HOME BOUND STATUS: I certify that my clinical findings support that this patient is homebound (i.e., Due to illness or injury, pt requires aid of supportive devices such as crutches, cane, wheelchairs, walkers, the use of special transportation or the assistance of another person to leave their place of residence. There is a normal inability to leave the home and doing so requires considerable and taxing effort. Other absences are for medical reasons / religious services and are infrequent or of short duration when for other reasons). o If current dressing causes regression in wound condition, may D/C ordered dressing product/s and apply Normal Saline Moist Dressing daily until next [place] / Other MD appointment. [place] of regression  in wound condition at [phone]. Wound #6 Right,Lateral Lower Leg o [place] Visits - Monday and Friday [name], [name] ([id number]) o Home Health Nurse may visit PRN to address patientos wound care needs. o FACE TO FACE ENCOUNTER: MEDICARE and MEDICAID PATIENTS: I certify that this patient is under my care and that I had a face-to-face encounter that meets the physician face-to-face encounter requirements with this patient on this date. The encounter with the patient was in whole or in part for the following MEDICAL CONDITION: (primary reason for [place]) MEDICAL NECESSITY: I certify, that based on my findings, NURSING services are a medically necessary home health service. HOME BOUND STATUS: I certify that my clinical findings support that this patient is homebound (i.e., Due to illness or injury, pt requires aid of supportive devices such as crutches, cane, wheelchairs, walkers, the use of special transportation or the assistance of another person to leave their place of residence. There is a normal inability to leave the home and doing so requires considerable and taxing effort. Other absences are for medical reasons / religious services and are infrequent or of short duration when for other reasons). o If current dressing causes regression in wound condition, may D/C ordered dressing product/s and apply Normal Saline Moist Dressing daily until next [place] / Other MD appointment. [place] of regression in wound condition at [phone]. Wound #7 Left,Proximal,Lateral Lower Leg o Continue Home Health Visits - Monday and Friday o Home Health Nurse may visit PRN to address patientos wound care needs. o FACE TO FACE ENCOUNTER: MEDICARE and MEDICAID PATIENTS: I certify that this patient is under my care and that I had a face-to-face encounter that meets the physician face-to-face encounter requirements with this patient on this date. The encounter with the patient was in whole or in part for the following MEDICAL CONDITION: (primary reason for [place]) MEDICAL NECESSITY: I certify, that based on my findings, NURSING services are a medically necessary home health service. HOME BOUND STATUS: I certify that my clinical findings support that this patient is homebound (i.e., Due to illness or injury, pt requires aid of supportive devices such as crutches, cane, wheelchairs, walkers, the use of special transportation or the assistance of another person to leave their place of residence. There  is a normal inability to leave the home and doing so requires considerable and taxing effort. Other absences are for medical reasons / religious services and are infrequent or of short duration when for other reasons). o If current dressing causes regression in wound condition, may D/C ordered dressing product/s and apply Normal Saline Moist Dressing daily until next [place] / Other MD appointment. [place] of regression in wound condition at [phone]. Wound #8 Left,Proximal,Posterior Lower Leg o [place] Visits - Monday and Friday o Home Health Nurse may visit PRN to address patientos wound care needs. o FACE TO FACE ENCOUNTER: MEDICARE and MEDICAID PATIENTS: I certify that this patient is under my care and that I had a face-to-face encounter that meets the physician face-to-face encounter requirements with this patient on this date. The encounter with the patient was in whole or in part for the following MEDICAL CONDITION: (primary reason for [place]) MEDICAL NECESSITY: I certify, that based on my findings, NURSING services are a medically necessary home health service. HOME BOUND STATUS: I certify that my clinical findings support that this patient is homebound (i.e., Due to illness or injury,  pt requires aid of supportive devices such as crutches, cane, wheelchairs, walkers, the use of special transportation or the assistance of another person to leave their place of residence. There is a normal inability to leave the home and doing so requires considerable and taxing effort. Other absences are for medical reasons / religious services and are infrequent or of short duration when for other reasons). o If current dressing causes regression in wound condition, may D/C ordered dressing product/s and apply Normal Saline Moist Dressing daily until next [place] / Other MD appointment. [place] of  regression in wound condition at [phone]. Laboratory o Bacteria identified in Wound by Culture (MICRO) - Left lower leg oooo LOINC Code: 4403-4 oooo Convenience Name: Wound culture routine Patient Medications Conaty, Kay A. ([id number]) Allergies: lisinopril Notifications Medication Indication Start End cefdinir wound infection 02/27/2018 DOSE oral 300 mg capsule - 1 capsule oral bid for 7 days Electronic Signature(s) Signed: 02/27/2018 8:24:04 AM By: Linton Ham MD Previous Signature: 02/22/2018 5:44:56 PM Version By: Gretta Cool BSN, RN, CWS, Kim RN, BSN Previous Signature: 02/22/2018 6:35:58 PM Version By: Linton Ham MD Entered By: Linton Ham on 02/27/2018 08:24:04 [name], [name] ([id number]) -------------------------------------------------------------------------------- Problem List Details Patient Name: Tonne, Takoda A. Date of Service: 02/22/2018 10:15 AM Medical Record Number: [id number] Patient Account Number: [id number] Date of Birth/Sex: June 03, 1971 (47 y.o. 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 He Decision completed 7 days of Making : ciprofloxacin last week. I89.0 Lymphedema, not elsewhere classified 12/28/2017 Yes Yes Status Complications Interventions Worsening the patient's overall edema status is not good he's been we are using 4-layer putting weight on every time he is here. I call his compression with nephrologist office last week to report this they've rolled ABDs over the Medical increased his  torsemide to 2 pills a dayo 40 mg. This will wound area. He also Decision obviously impair wound healing especially in the left leg has compression Making : which has more lymphedema pumps he is using these twice a day at home. T01.601 Chronic venous hypertension (idiopathic) with ulcer and 12/28/2017 No Yes inflammation of left lower extremity E11.622 Type 2 diabetes mellitus with other skin ulcer 12/28/2017 No Yes L97.211 Non-pressure chronic ulcer of right calf limited to 02/22/2018 No Yes breakdown of skin Inactive Problems Resolved Problems [name], [name] ([id number]) Electronic Signature(s) Signed: 02/22/2018 6:35:58 PM By: Linton Ham MD Entered By: Linton Ham on 02/22/2018 11:52:31 [name], [name] ([id number]) -------------------------------------------------------------------------------- Progress Note Details Patient Name: Binegar, Darren A. Date of Service: 02/22/2018 10:15 AM Medical Record Number: [id number] Patient Account Number: [id number] Date of Birth/Sex: 1971-06-23 (47 y.o. M) Treating RN: [name] Primary Care Provider: Gaetano [name] Other Clinician: Referring Provider: Gaetano [name] Treating Provider/Extender: [name] in Treatment: 8 Subjective History of Present Illness (HPI) 12/28/17; this is a now 47 year old man who is a type II diabetic. He was hospitalized from 10/01/17 through 10/19/17. He had an MSSA soft tissue and skin infection. 2 open areas on the left leg were identified he has a smaller area on the left medial calf superiorly just below the knee and  a wound just above the left ankle on the posterior medial aspect. I think both of these were surgical IandD sites when he was in the hospital. He was discharged with a wound VAC at that point however this is since been taken off. He follows with [name] for the [facility] and he is still on chronic Keflex at 500 twice a day. At that time he was hospitalized his hemoglobin A1c was 15.1  however if I'm reading his endocrinologist notes correctly that is improved. He has been following with [name] at vein and vascular and he has been applying calcium alginate and Unna boots. He has home health changing the dressing. They have also been attempting to get him external compression pumps although the patient is unaware whether they've been approved by insurance at this point. as mentioned he has a smaller clean wound on the right lateral calf just below the knee and he has a much larger area just above the left ankle medially and posteriorly. Our intake nurse reported greenish purulent looking drainage.the patient did have surgical material sent to pathology in February. This showed chronic abscess The patient also has lymphedema stage III in the left greater than right lower extremities. He has a history of blisters with wounds but these of all were always healed. The patient thinks that the lymphedema may have been present since he was about 47 years old i.e. about 30 years. He does not have graded pressure stockings and has not worn stockings. He does not have a distant history of DVT PE or phlebitis. He has not been systemically unwell fever no chills. He states that his Lasix is recently been reduced. He tells me his kidney function is at "30%" and he has been followed by [name] of nephrology. At one point he was on Lasix 80 twice a day however that's been cut back and he is now on Lasix at 20 twice a day. The patient has a history of PAD listed in his records although he comes from [name] I don't think is felt to have significant PAD. ABIs in our clinic were noncompressible bilaterally. 01/04/18; patient has a large wound on the left lateral lower calf and a small wound on the left medial upper calf. He has been to see his nephrologist who changed him to Demadex 40 mg a day. I'm hopeful this will help with his systemic fluid overload. He also has stage III lymphedema.  Really no change in the 2 wounds since last week 01/11/18; the patient is down 13 pounds. He put his stage III lymphedema left leg in 4K compression last week and there is less edema fluid however we still haven't been able to communicate with home health but apparently it is [facility] but the dressings have not been changed. The patient noted an odor last week. He is also had compression pumps ordered by [facility] this as a not completed the paperwork stage. 01/18/18; patient continues to lose weight. Stage III lymphedema in the left greater than right leg under for alert compression. The major wound is on the left lateral ankle area. He apparently has bilateral compression pumps being brought to his house, these were ordered by [facility] Notable for the fact today he had some blisters on the right anterior leg together with some skin nodules. This is no doubt secondary to severe lymphedema. 01/25/18; the patient has obtained his compression pumps and is using them per vein and vascular instructions  3 times a day for an hour. He also saw [name] of vein and vascular. He was felt to have venous insufficiency but did not suggest any intervention also improved edema. It was suggested that he have compression stockings 20-30 mm on a daily basis in addition to compression pumps. The patient arrives in clinic today with a layer of unna under for layer compression. he seems to have some trouble with the degree of compression. He has open areas on the left lateral ankle area which is his major wound left upper medial calf and a Pettigrew, Dmarion A. ([id number]) superficial open area on the right anterior shin area which was blistered last week. He has skin changes on the right anterior calf which I think are no doubt secondary to lymphedema skin nodules etc. 02/01/18; the patient comes in telling us his nephrologist have to his torsemide. Unfortunately today he is put on 7 pounds  by our scales. He has blisters all over the anterior and medial part of his right calf and a new open wound. He also has soupy green drainage coming out of the left lateral calf /ankle wound. 02/08/18; culture I did last week of the left lateral ankle wound grew both Pseudomonas and Morganella. He is on Keflex from [name] in the hospital. I will need to review these notes.in any case Keflex is not going to cover these 2 organisms. I'm probably going to added ciprofloxacin today for 1 week. A lot of drainage that looks purulent last week. He is not complaining of pain however he has managed to put on 10 pounds in 2 weeks by our scales in this clinic. He is going to see his nephrologist tomorrow 02/15/18; he completed the ciprofloxacin I gave him last week. Notable that he is up to 379 pounds today which is up 16 pounds from 2 weeks ago. He is complaining of orthopnea but doesn't have any chest pain. 02/22/18; he continues to have weight gain. R intake nurse reports again purulent green drainage coming out of the lateral wound on the lateral left calf. He has small open area on the right anterior leg. His torsemide was increased to 2 tablets a day I believe this is 40 mg last week in response to the call admitted to Dr. Keturah Barre office. He follows up with [name] and Dr. Delana Meyer tomorrow Objective Constitutional Patient is hypertensive.. Pulse regular and within target range for patient.[name] Respirations regular, non-labored and within target range.. Temperature is normal and within the target range for the patient.. does not look systemically unwell. Vitals Time Taken: 10:28 AM, Height: 76 in, Weight: 379.5 lbs, BMI: 46.2, Temperature: 98.2 F, Pulse: 58 bpm, Respiratory Rate: 18 breaths/min, Blood Pressure: 172/81 mmHg. Respiratory Respiratory effort is easy and symmetric bilaterally. Rate is normal at rest and on room air.. Bilateral breath sounds are clear and equal in all lobes with no  wheezes, rales or rhonchi.. Cardiovascular heart sounds are distant JVP is elevated. There is no coccyx edema. dorsalis pedis pulses are palpable bilaterally. severe lymphedema in the left greater than right leg. No evidence of a DVT. Lymphatic unpalpable and the popliteal or inguinal area. Psychiatric Patient appears depressed today.. General Notes: wound exam; the medial right calf is actually closed. There is a area on the anterior right That is still open but is superficial, largely unchanged from last week. The area on the left lateral calf appears to have less overall wound area however the draining area superiorly is still open and  I have recultured this. There is a lot of edema around this wound. No crepitus no erythema and no tenderness Integumentary (Hair, Skin) no primary skin issues are seen. Wound #2 status is Open. Original cause of wound was Gradually Appeared. The wound is located on the Cleary, Adante A. ([id number]) Left,Lateral,Posterior Lower Leg. The wound measures 2.5cm length x 5.4cm width x 0.1cm depth; 10.603cm^2 area and 1.06cm^3 volume. Wound #4 status is Open. Original cause of wound was Gradually Appeared. The wound is located on the Right,Anterior Lower Leg. The wound measures 3cm length x 1.8cm width x 0.1cm depth; 4.241cm^2 area and 0.424cm^3 volume. There is no tunneling or undermining noted. There is a large amount of purulent drainage noted. The wound margin is distinct with the outline attached to the wound base. There is large (67-100%) red granulation within the wound bed. There is a small (1-33%) amount of necrotic tissue within the wound bed including Eschar. The periwound skin appearance did not exhibit: Callus, Crepitus, Excoriation, Induration, Rash, Scarring, Dry/Scaly, Maceration, Atrophie Blanche, Cyanosis, Ecchymosis, Hemosiderin Staining, Mottled, Pallor, Rubor, Erythema. Periwound temperature was noted as No Abnormality. The periwound has  tenderness on palpation. Wound #5 status is Open. Original cause of wound was Blister. The wound is located on the Right,Medial Lower Leg. The wound measures 0.7cm length x 0.7cm width x 0.1cm depth; 0.385cm^2 area and 0.038cm^3 volume. Wound #6 status is Open. Original cause of wound was Gradually Appeared. The wound is located on the Right,Lateral Lower Leg. The wound measures 0.2cm length x 0.2cm width x 0.1cm depth; 0.031cm^2 area and 0.003cm^3 volume. Wound #7 status is Open. Original cause of wound was Gradually Appeared. The wound is located on the Left,Proximal,Lateral Lower Leg. The wound measures 1.5cm length x 2cm width x 0.1cm depth; 2.356cm^2 area and 0.236cm^3 volume. There is Fat Layer (Subcutaneous Tissue) Exposed exposed. There is no tunneling or undermining noted. There is a large amount of sanguinous drainage noted. The wound margin is flat and intact. There is large (67-100%) red granulation within the wound bed. There is a small (1-33%) amount of necrotic tissue within the wound bed including Eschar. The periwound skin appearance did not exhibit: Callus, Crepitus, Excoriation, Induration, Rash, Scarring, Dry/Scaly, Maceration, Atrophie Blanche, Cyanosis, Ecchymosis, Hemosiderin Staining, Mottled, Pallor, Rubor, Erythema. Periwound temperature was noted as No Abnormality. The periwound has tenderness on palpation. Wound #8 status is Open. Original cause of wound was Gradually Appeared. The wound is located on the Left,Proximal,Posterior Lower Leg. The wound measures 7cm length x 4.5cm width x 0.1cm depth; 24.74cm^2 area and 2.474cm^3 volume. There is Fat Layer (Subcutaneous Tissue) Exposed exposed. There is no tunneling or undermining noted. There is a large amount of serosanguineous drainage noted. The wound margin is distinct with the outline attached to the wound base. There is medium (34-66%) red granulation within the wound bed. There is a medium (34-66%) amount of necrotic  tissue within the wound bed including Eschar and Adherent Slough. The periwound skin appearance exhibited: Dry/Scaly. The periwound skin appearance did not exhibit: Callus, Crepitus, Excoriation, Induration, Rash, Scarring, Maceration, Atrophie Blanche, Cyanosis, Ecchymosis, Hemosiderin Staining, Mottled, Pallor, Rubor, Erythema. Assessment Active Problems ICD-10 Non-pressure chronic ulcer of left calf with necrosis of muscle Lymphedema, not elsewhere classified Chronic venous hypertension (idiopathic) with ulcer and inflammation of left lower extremity Type 2 diabetes mellitus with other skin ulcer Non-pressure chronic ulcer of right calf limited to breakdown of skin Plan Wound Cleansing: Hollabaugh, Duante A. ([id number]) Wound #4 Right,Anterior Lower Leg: Clean wound  with Normal Saline. Anesthetic (add to Medication List): Wound #2 Left,Lateral,Posterior Lower Leg: Topical Lidocaine 4% cream applied to wound bed prior to debridement (In Clinic Only). Wound #4 Right,Anterior Lower Leg: Topical Lidocaine 4% cream applied to wound bed prior to debridement (In Clinic Only). Wound #5 Right,Medial Lower Leg: Topical Lidocaine 4% cream applied to wound bed prior to debridement (In Clinic Only). Wound #6 Right,Lateral Lower Leg: Topical Lidocaine 4% cream applied to wound bed prior to debridement (In Clinic Only). Wound #7 Left,Proximal,Lateral Lower Leg: Topical Lidocaine 4% cream applied to wound bed prior to debridement (In Clinic Only). Wound #8 Left,Proximal,Posterior Lower Leg: Topical Lidocaine 4% cream applied to wound bed prior to debridement (In Clinic Only). Skin Barriers/Peri-Wound Care: Wound #2 Left,Lateral,Posterior Lower Leg: Barrier cream Wound #4 Right,Anterior Lower Leg: Barrier cream Wound #5 Right,Medial Lower Leg: Barrier cream Wound #6 Right,Lateral Lower Leg: Barrier cream Wound #7 Left,Proximal,Lateral Lower Leg: Barrier cream Wound #8 Left,Proximal,Posterior  Lower Leg: Barrier cream Primary Wound Dressing: Wound #2 Left,Lateral,Posterior Lower Leg: Silver Alginate Wound #4 Right,Anterior Lower Leg: Silver Alginate Wound #5 Right,Medial Lower Leg: Silver Alginate Wound #6 Right,Lateral Lower Leg: Silver Alginate Wound #7 Left,Proximal,Lateral Lower Leg: Silver Alginate Wound #8 Left,Proximal,Posterior Lower Leg: Silver Alginate Secondary Dressing: Wound #2 Left,Lateral,Posterior Lower Leg: ABD pad XtraSorb Wound #4 Right,Anterior Lower Leg: ABD pad XtraSorb Wound #5 Right,Medial Lower Leg: ABD pad XtraSorb Wound #6 Right,Lateral Lower Leg: ABD pad XtraSorb Wound #7 Left,Proximal,Lateral Lower Leg: ABD pad XtraSorb Wound #8 Left,Proximal,Posterior Lower Leg: ABD pad XtraSorb [name], [name] ([id number]) Dressing Change Frequency: Wound #2 Left,Lateral,Posterior Lower Leg: Change Dressing Monday, Wednesday, Friday - Wednesday in clinic. Wound #4 Right,Anterior Lower Leg: Change Dressing Monday, Wednesday, Friday - Wednesday in clinic. Wound #5 Right,Medial Lower Leg: Change Dressing Monday, Wednesday, Friday - Wednesday in clinic. Wound #6 Right,Lateral Lower Leg: Change Dressing Monday, Wednesday, Friday - Wednesday in clinic. Wound #7 Left,Proximal,Lateral Lower Leg: Change Dressing Monday, Wednesday, Friday - Wednesday in clinic. Wound #8 Left,Proximal,Posterior Lower Leg: Change Dressing Monday, Wednesday, Friday - Wednesday in clinic. Follow-up Appointments: Wound #2 Left,Lateral,Posterior Lower Leg: Return Appointment in 1 week. Wound #4 Right,Anterior Lower Leg: Return Appointment in 1 week. Wound #5 Right,Medial Lower Leg: Return Appointment in 1 week. Wound #6 Right,Lateral Lower Leg: Return Appointment in 1 week. Wound #7 Left,Proximal,Lateral Lower Leg: Return Appointment in 1 week. Wound #8 Left,Proximal,Posterior Lower Leg: Return Appointment in 1 week. Edema Control: Wound #2 Left,Lateral,Posterior  Lower Leg: 4 Layer Compression System - Bilateral Elevate legs to the level of the heart and pump ankles as often as possible Compression Pump: Use compression pump on left lower extremity for 30 minutes, twice daily. Compression Pump: Use compression pump on right lower extremity for 30 minutes, twice daily. Wound #4 Right,Anterior Lower Leg: 4 Layer Compression System - Bilateral Elevate legs to the level of the heart and pump ankles as often as possible Compression Pump: Use compression pump on left lower extremity for 30 minutes, twice daily. Compression Pump: Use compression pump on right lower extremity for 30 minutes, twice daily. Wound #5 Right,Medial Lower Leg: 4 Layer Compression System - Bilateral Elevate legs to the level of the heart and pump ankles as often as possible Compression Pump: Use compression pump on left lower extremity for 30 minutes, twice daily. Compression Pump: Use compression pump on right lower extremity for 30 minutes, twice daily. Wound #6 Right,Lateral Lower Leg: 4 Layer Compression System - Bilateral Elevate legs to the level of the heart  and pump ankles as often as possible Compression Pump: Use compression pump on left lower extremity for 30 minutes, twice daily. Compression Pump: Use compression pump on right lower extremity for 30 minutes, twice daily. Wound #7 Left,Proximal,Lateral Lower Leg: 4 Layer Compression System - Bilateral Elevate legs to the level of the heart and pump ankles as often as possible Compression Pump: Use compression pump on left lower extremity for 30 minutes, twice daily. Compression Pump: Use compression pump on right lower extremity for 30 minutes, twice daily. Wound #8 Left,Proximal,Posterior Lower Leg: 4 Layer Compression System - Bilateral Elevate legs to the level of the heart and pump ankles as often as possible Compression Pump: Use compression pump on left lower extremity for 30 minutes, twice daily. Compression  Pump: Use compression pump on right lower extremity for 30 minutes, twice daily. Home Health: Wound #2 Left,Lateral,Posterior Lower Leg: [place] Visits - Monday and Friday Stavros, Merrel A. ([id number]) Home Health Nurse may visit PRN to address patient s wound care needs. FACE TO FACE ENCOUNTER: MEDICARE and MEDICAID PATIENTS: I certify that this patient is under my care and that I had a face-to-face encounter that meets the physician face-to-face encounter requirements with this patient on this date. The encounter with the patient was in whole or in part for the following MEDICAL CONDITION: (primary reason for [place]) MEDICAL NECESSITY: I certify, that based on my findings, NURSING services are a medically necessary home health service. HOME BOUND STATUS: I certify that my clinical findings support that this patient is homebound (i.e., Due to illness or injury, pt requires aid of supportive devices such as crutches, cane, wheelchairs, walkers, the use of special transportation or the assistance of another person to leave their place of residence. There is a normal inability to leave the home and doing so requires considerable and taxing effort. Other absences are for medical reasons / religious services and are infrequent or of short duration when for other reasons). If current dressing causes regression in wound condition, may D/C ordered dressing product/s and apply Normal Saline Moist Dressing daily until next [place] / Other MD appointment. [place] of regression in wound condition at [phone]. Wound #4 Right,Anterior Lower Leg: [place] Visits - Monday and Friday Home Health Nurse may visit PRN to address patient s wound care needs. FACE TO FACE ENCOUNTER: MEDICARE and MEDICAID PATIENTS: I certify that this patient is under my care and that I had a face-to-face encounter that meets the physician face-to-face encounter  requirements with this patient on this date. The encounter with the patient was in whole or in part for the following MEDICAL CONDITION: (primary reason for [place]) MEDICAL NECESSITY: I certify, that based on my findings, NURSING services are a medically necessary home health service. HOME BOUND STATUS: I certify that my clinical findings support that this patient is homebound (i.e., Due to illness or injury, pt requires aid of supportive devices such as crutches, cane, wheelchairs, walkers, the use of special transportation or the assistance of another person to leave their place of residence. There is a normal inability to leave the home and doing so requires considerable and taxing effort. Other absences are for medical reasons / religious services and are infrequent or of short duration when for other reasons). If current dressing causes regression in wound condition, may D/C ordered dressing product/s and apply Normal Saline Moist Dressing daily until next [place] / Other MD appointment. [facility] of [facility] in wound condition at [phone]. Wound #5 Right,Medial Lower Leg: Continue Home Health Visits - Monday and Friday Home Health Nurse may visit PRN to address patient s wound care needs. FACE TO FACE ENCOUNTER: MEDICARE and MEDICAID PATIENTS: I certify that this patient is under my care and that I had a face-to-face encounter that meets the physician face-to-face encounter requirements with this patient on this date. The encounter with the patient was in whole or in part for the following MEDICAL CONDITION: (primary reason for [place]) MEDICAL NECESSITY: I certify, that based on my findings, NURSING services are a medically necessary home health service. HOME BOUND STATUS: I certify that my clinical findings support that this patient is homebound (i.e., Due to illness or injury, pt requires aid of supportive devices such as crutches,  cane, wheelchairs, walkers, the use of special transportation or the assistance of another person to leave their place of residence. There is a normal inability to leave the home and doing so requires considerable and taxing effort. Other absences are for medical reasons / religious services and are infrequent or of short duration when for other reasons). If current dressing causes regression in wound condition, may D/C ordered dressing product/s and apply Normal Saline Moist Dressing daily until next [place] / Other MD appointment. [place] of regression in wound condition at [phone]. Wound #6 Right,Lateral Lower Leg: Continue Home Health Visits - Monday and Friday Home Health Nurse may visit PRN to address patient s wound care needs. FACE TO FACE ENCOUNTER: MEDICARE and MEDICAID PATIENTS: I certify that this patient is under my care and that I had a face-to-face encounter that meets the physician face-to-face encounter requirements with this patient on this date. The encounter with the patient was in whole or in part for the following MEDICAL CONDITION: (primary reason for [place]) MEDICAL NECESSITY: I certify, that based on my findings, NURSING services are a medically necessary home health service. HOME BOUND STATUS: I certify that my clinical findings support that this patient is homebound (i.e., Due to illness or injury, pt requires aid of supportive devices such as crutches, cane, wheelchairs, walkers, the use of special transportation or the assistance of another person to leave their place of residence. There is a normal inability to leave the home and doing so requires considerable and taxing effort. Other absences are for medical reasons / religious services and are infrequent or of short duration when for other reasons). If current dressing causes regression in wound condition, may D/C ordered dressing product/s and apply Normal  Saline Moist Dressing daily until next [place] / Other MD appointment. [place] of regression in wound condition at [phone]. Wound #7 Left,Proximal,Lateral Lower Leg: Continue Home Health Visits - Monday and Friday Puchalski, Clancey A. ([id number]) Home Health Nurse may visit PRN to address patient s wound care needs. FACE TO FACE ENCOUNTER: MEDICARE and MEDICAID PATIENTS: I certify that this patient is under my care and that I had a face-to-face encounter that meets the physician face-to-face encounter requirements with this patient on this date. The encounter with the patient was in whole or in part for the following MEDICAL CONDITION: (primary reason for [place]) MEDICAL NECESSITY: I certify, that based on my findings, NURSING services are a medically necessary home health service. HOME BOUND STATUS: I certify that my clinical findings support that this patient is homebound (i.e., Due to illness or injury, pt requires aid of  supportive devices such as crutches, cane, wheelchairs, walkers, the use of special transportation or the assistance of another person to leave their place of residence. There is a normal inability to leave the home and doing so requires considerable and taxing effort. Other absences are for medical reasons / religious services and are infrequent or of short duration when for other reasons). If current dressing causes regression in wound condition, may D/C ordered dressing product/s and apply Normal Saline Moist Dressing daily until next [place] / Other MD appointment. [place] of regression in wound condition at [phone]. Wound #8 Left,Proximal,Posterior Lower Leg: Continue Home Health Visits - Monday and Friday Home Health Nurse may visit PRN to address patient s wound care needs. FACE TO FACE ENCOUNTER: MEDICARE and MEDICAID PATIENTS: I certify that this patient is under my care and that I had  a face-to-face encounter that meets the physician face-to-face encounter requirements with this patient on this date. The encounter with the patient was in whole or in part for the following MEDICAL CONDITION: (primary reason for [place]) MEDICAL NECESSITY: I certify, that based on my findings, NURSING services are a medically necessary home health service. HOME BOUND STATUS: I certify that my clinical findings support that this patient is homebound (i.e., Due to illness or injury, pt requires aid of supportive devices such as crutches, cane, wheelchairs, walkers, the use of special transportation or the assistance of another person to leave their place of residence. There is a normal inability to leave the home and doing so requires considerable and taxing effort. Other absences are for medical reasons / religious services and are infrequent or of short duration when for other reasons). If current dressing causes regression in wound condition, may D/C ordered dressing product/s and apply Normal Saline Moist Dressing daily until next [place] / Other MD appointment. [place] of regression in wound condition at [phone]. Laboratory ordered were: Wound culture routine - Left lower leg Medical Decision Making Non-pressure chronic ulcer of left calf with necrosis of muscle 12/28/2017 Status: No change Complications: purulent drainage again noted by her intake nurse Interventions: I'm done a culture for CandS again. He completed 7 days of ciprofloxacin last week. Lymphedema, not elsewhere classified 12/28/2017 Status: Worsening Complications: the patient's overall edema status is not good he's been putting weight on every time he is here. I call his nephrologist office last week to report this they've increased his torsemide to 2 pills a dayo 40 mg. This will obviously impair wound healing especially in the left leg which has more lymphedema Interventions: we  are using 4-layer compression with rolled ABDs over the wound area. He also has compression pumps he is using these twice a day at home. #1 at this point I don't think we have any other choice what to do about the wounds per se. We're using silver alginate to the draining areas covered with absorbent secondary dressings/ABDs under for lower compression. He has compression pumps at home that he says he is using at least twice a day #2 purulent drainage coming out of the left lower Wound led me to do a repeat culture I gave him a week's worth of ciprofloxacin for Pseudomonas. If this is still growing pseudomonas may consider topical gentamicin as well as a repeat round of oral antibiotics. I believe he is also on doxycycline as originally prescribed by [name] #3 fluid volume overload probably secondary to severe chronic renal failure. He sees  his nephrologist [name] tomorrow. I called his office to report the weight gain and they've increased her Demadex although at the bedside he is still fluid overloaded. This adds to the lower extremity edema and the challenges in healing the wound Arriaga, Rocko A. ([id number]) Electronic Signature(s) Signed: 02/22/2018 6:35:58 PM By: Linton Ham MD Entered By: Linton Ham on 02/22/2018 12:05:05 Lua, Miqueas A. ([id number]) -------------------------------------------------------------------------------- SuperBill Details Patient Name: Hendrix, Esmond A. Date of Service: 02/22/2018 Medical Record Number: [id number] Patient Account Number: [id number] Date of Birth/Sex: 12/27/1970 (48 y.o. M) Treating RN: [name] Primary Care Provider: Gaetano [name] Other Clinician: Referring Provider: Gaetano [name] Treating Provider/Extender: [name] in Treatment: 8 Diagnosis Coding ICD-10 Codes Code Description [phone] Non-pressure chronic ulcer of left calf with necrosis of muscle I89.0 Lymphedema, not elsewhere classified I87.332 Chronic  venous hypertension (idiopathic) with ulcer and inflammation of left lower extremity E11.622 Type 2 diabetes mellitus with other skin ulcer L97.211 Non-pressure chronic ulcer of right calf limited to breakdown of skin Facility Procedures CPT4: Description Modifier Quantity Code [id number] BILATERAL: Application of multi-layer venous compression system; leg (below 1 knee), including ankle and foot. Physician Procedures CPT4 Code: [id number] Description: 96438 - WC PHYS LEVEL 4 - EST PT ICD-10 Diagnosis Description L97.223 Non-pressure chronic ulcer of left calf with necrosis of mu I89.0 Lymphedema, not elsewhere classified L97.211 Non-pressure chronic ulcer of right calf limited to breakdo Modifier: scle wn of skin Quantity: 1 Electronic Signature(s) Signed: 02/22/2018 6:35:58 PM By: Linton Ham MD Entered By: Linton Ham on 02/22/2018 12:09:07

## 2018-02-24 NOTE — Progress Notes (Signed)
RONAN, DUECKER ([id number]) Visit Report for 02/22/2018 Arrival Information Details Patient Name: [name], [name] Date of Service: 02/22/2018 10:15 AM Medical Record Number: [id number] Patient Account Number: [id number] Date of Birth/Sex: 1970/11/16 (47 y.o. M) Treating RN: Roger Shelter Primary Care Thayne Cindric: Gaetano Net Other Clinician: Referring Trisa Cranor: Gaetano Net Treating Cashmere Harmes/Extender: Tito Dine in Treatment: 8 Visit Information History Since Last Visit All ordered tests and consults were completed: No Patient Arrived: Ambulatory Added or deleted any medications: No Arrival Time: 10:23 Any new allergies or adverse reactions: No Accompanied By: self Had a fall or experienced change in No Transfer Assistance: None activities of daily living that may affect Patient Identification Verified: Yes risk of falls: Secondary Verification Process Yes Signs or symptoms of abuse/neglect since last visito No Completed: Hospitalized since last visit: No Patient Requires Transmission-Based No Implantable device outside of the clinic excluding No Precautions: cellular tissue based products placed in the center Patient Has Alerts: Yes since last visit: Patient Alerts: DM II Pain Present Now: No noncompressible bilateral Electronic Signature(s) Signed: 02/22/2018 5:20:09 PM By: Roger Shelter Entered By: Roger Shelter on 02/22/2018 10:25:20 [name], [name] ([id number]) -------------------------------------------------------------------------------- Encounter Discharge Information Details Patient Name: [name], [name] Date of Service: 02/22/2018 10:15 AM Medical Record Number: [id number] Patient Account Number: [id number] Date of Birth/Sex: Feb 27, 1971 (47 y.o. M) Treating RN: Roger Shelter Primary Care Kelyn Ponciano: Gaetano Net Other Clinician: Referring Lujuana Kapler: Gaetano Net Treating Deneisha Dade/Extender: Tito Dine in Treatment: 8 Encounter  Discharge Information Items Discharge Condition: Stable Ambulatory Status: Ambulatory Discharge Destination: Home Transportation: Private Auto Schedule Follow-up Appointment: Yes Clinical Summary of Care: Electronic Signature(s) Signed: 02/22/2018 5:20:09 PM By: Roger Shelter Entered By: Roger Shelter on 02/22/2018 11:31:37 Stille, Jeb A. ([id number]) -------------------------------------------------------------------------------- Lower Extremity Assessment Details Patient Name: Bhat, Audrick A. Date of Service: 02/22/2018 10:15 AM Medical Record Number: [id number] Patient Account Number: [id number] Date of Birth/Sex: 22-Sep-1970 (47 y.o. M) Treating RN: Roger Shelter Primary Care Xylina Rhoads: Gaetano Net Other Clinician: Referring Morrell Fluke: Gaetano Net Treating Caelin Rosen/Extender: Tito Dine in Treatment: 8 Edema Assessment Assessed: [Left: No] [Right: No] Edema: [Left: Yes] [Right: Yes] Calf Left: Right: Point of Measurement: 42 cm From Medial Instep 58.2 cm 50 cm Ankle Left: Right: Point of Measurement: 12 cm From Medial Instep 49.7 cm 38.1 cm Vascular Assessment Claudication: Claudication Assessment [Left:Rest Pain] [Right:Rest Pain] Pulses: Dorsalis Pedis Palpable: [Left:Yes] [Right:Yes] Posterior Tibial Extremity colors, hair growth, and conditions: Extremity Color: [Left:Hyperpigmented] [Right:Hyperpigmented] Hair Growth on Extremity: [Left:Yes] [Right:Yes] Temperature of Extremity: [Left:Warm] [Right:Warm] Capillary Refill: [Left:< 3 seconds] [Right:< 3 seconds] Toe Nail Assessment Left: Right: Thick: Yes Yes Discolored: Yes Yes Deformed: No No Improper Length and Hygiene: No No Electronic Signature(s) Signed: 02/22/2018 5:20:09 PM By: Roger Shelter Entered By: Roger Shelter on 02/22/2018 10:50:13 [name], [name] ([id number]) -------------------------------------------------------------------------------- Multi Wound Chart  Details Patient Name: [name], [name] Date of Service: 02/22/2018 10:15 AM Medical Record Number: [id number] Patient Account Number: [id number] Date of Birth/Sex: Jul 12, 1971 (47 y.o. M) Treating RN: Cornell Barman Primary Care Anchor Dwan: Gaetano Net Other Clinician: Referring Floriene Jeschke: Gaetano Net Treating Topaz Raglin/Extender: Tito Dine in Treatment: 8 Vital Signs Height(in): 76 Pulse(bpm): 58 Weight(lbs): 379.5 Blood Pressure(mmHg): 172/81 Body Mass Index(BMI): 46 Temperature(F): 98.2 Respiratory Rate 18 (breaths/min): Photos: [2:No Photos] [4:No Photos] [5:No Photos] Wound Location: [2:Left, Lateral, Posterior Lower Leg] [4:Right Lower Leg - Anterior] [5:Right, Medial Lower Leg] Wounding Event: [2:Gradually Appeared] [4:Gradually Appeared] [5:Blister] Primary Etiology: [2:Diabetic Wound/Ulcer of the Lower Extremity] [4:Lymphedema] [5:Lymphedema] Secondary  Etiology: [2:Lymphedema] [4:N/A] [5:Diabetic Wound/Ulcer of the Lower Extremity] Comorbid History: [2:N/A] [4:Hypertension, Type II Diabetes, Neuropathy] [5:N/A] Date Acquired: [2:09/30/2017] [4:01/25/2018] [5:02/01/2018] Weeks of Treatment: [2:8] [4:4] [5:3] Wound Status: [2:Open] [4:Open] [5:Open] Clustered Wound: [2:No] [4:No] [5:Yes] Clustered Quantity: [2:N/A] [4:N/A] [5:N/A] Measurements L x W x D [2:2.5x5.4x0.1] [4:3x1.8x0.1] [5:0.7x0.7x0.1] (cm) Area (cm) : [2:10.603] [4:4.241] [5:0.385] Volume (cm) : [2:1.06] [4:0.424] [5:0.038] % Reduction in Area: [2:62.50%] [4:-8923.40%] [5:96.40%] % Reduction in Volume: [2:90.60%] [4:-8380.00%] [5:96.50%] Classification: [2:Grade 2] [4:Full Thickness Without Exposed Support Structures] [5:Full Thickness Without Exposed Support Structures] Exudate Amount: [2:N/A] [4:Large] [5:N/A] Exudate Type: [2:N/A] [4:Purulent] [5:N/A] Exudate Color: [2:N/A] [4:yellow, brown, green] [5:N/A] Wound Margin: [2:N/A] [4:Distinct, outline attached] [5:N/A] Granulation Amount: [2:N/A]  [4:Large (67-100%)] [5:N/A] Granulation Quality: [2:N/A] [4:Red] [5:N/A] Necrotic Amount: [2:N/A] [4:Small (1-33%)] [5:N/A] Necrotic Tissue: [2:N/A] [4:Eschar] [5:N/A] Epithelialization: [2:N/A] [4:None] [5:N/A] Periwound Skin Texture: [2:No Abnormalities Noted] [4:Excoriation: No Induration: No Callus: No Crepitus: No] [5:No Abnormalities Noted] Rash: No Scarring: No Periwound Skin Moisture: No Abnormalities Noted Maceration: No No Abnormalities Noted Dry/Scaly: No Periwound Skin Color: No Abnormalities Noted Atrophie Blanche: No No Abnormalities Noted Cyanosis: No Ecchymosis: No Erythema: No Hemosiderin Staining: No Mottled: No Pallor: No Rubor: No Temperature: N/A No Abnormality N/A Tenderness on Palpation: No Yes No Wound Preparation: N/A Ulcer Cleansing: N/A Rinsed/Irrigated with Saline Topical Anesthetic Applied: Other: lidocaine 4% Wound Number: [id number]  Photos: No Photos No Photos No Photos Wound Location: Right, Lateral Lower Leg Left Lower Leg - Lateral, Left Lower Leg - Posterior, Proximal Proximal Wounding Event: Gradually Appeared Gradually Appeared Gradually Appeared Primary Etiology: Lymphedema Lymphedema Lymphedema Secondary Etiology: N/A N/A N/A Comorbid History: N/A Hypertension, Type II Hypertension, Type II Diabetes, Neuropathy Diabetes, Neuropathy Date Acquired: 02/08/2018 02/15/2018 02/22/2018 Weeks of Treatment: 2 1 0 Wound Status: Open Open Open Clustered Wound: No No Yes Clustered Quantity: N/A N/A 3 Measurements L x W x D 0.2x0.2x0.1 1.5x2x0.1 7x4.5x0.1 (cm) Area (cm) : 0.031 2.356 24.74 Volume (cm) : 0.003 0.236 2.474 % Reduction in Area: 96.10% -172.70% N/A % Reduction in Volume: 96.20% -174.40% N/A Classification: Full Thickness Without Full Thickness Without Full Thickness Without Exposed Support Structures Exposed Support Structures Exposed Support Structures Exudate Amount: N/A Large Large Exudate Type: N/A Sanguinous  Serosanguineous Exudate Color: N/A red red, brown Wound Margin: N/A Flat and Intact Distinct, outline attached Granulation Amount: N/A Large (67-100%) Medium (34-66%) Granulation Quality: N/A Red Red Necrotic Amount: N/A Small (1-33%) Medium (34-66%) Necrotic Tissue: N/A Eschar Eschar, Adherent Slough Exposed Structures: N/A Fat Layer (Subcutaneous Fat Layer (Subcutaneous Tissue) Exposed: Yes Tissue) Exposed: Yes Fascia: No Fascia: No Tendon: No Tendon: No Muscle: No Muscle: No Joint: No Joint: No Bone: No Bone: No Sonn, Izaya A. ([id number]) Epithelialization: N/A None None Periwound Skin Texture: No Abnormalities Noted Excoriation: No Excoriation: No Induration: No Induration: No Callus: No Callus: No Crepitus: No Crepitus: No Rash: No Rash: No Scarring: No Scarring: No Periwound Skin Moisture: No Abnormalities Noted Maceration: No Dry/Scaly: Yes Dry/Scaly: No Maceration: No Periwound Skin Color: No Abnormalities Noted Atrophie Blanche: No Atrophie Blanche: No Cyanosis: No Cyanosis: No Ecchymosis: No Ecchymosis: No Erythema: No Erythema: No Hemosiderin Staining: No Hemosiderin Staining: No Mottled: No Mottled: No Pallor: No Pallor: No Rubor: No Rubor: No Temperature: N/A No Abnormality N/A Tenderness on Palpation: No Yes No Wound Preparation: N/A Ulcer Cleansing: Ulcer Cleansing: Rinsed/Irrigated with Saline Rinsed/Irrigated with Saline Topical Anesthetic Applied: Topical Anesthetic Applied: Other: lidocaine 4% Other: lidocaine 4% Treatment Notes Electronic Signature(s) Signed: 02/22/2018 5:44:56 PM  By: Gretta Cool, BSN, RN, CWS, Kim RN, BSN Entered By: Gretta Cool, BSN, RN, CWS, Kim on 02/22/2018 11:07:36 [name], [name] ([id number]) -------------------------------------------------------------------------------- Multi-Disciplinary Care Plan Details Patient Name: Clopper, Zandyr A. Date of Service: 02/22/2018 10:15 AM Medical Record Number:  [id number] Patient Account Number: [id number] Date of Birth/Sex: 08/22/1971 (47 y.o. M) Treating RN: Cornell Barman Primary Care Makeba Delcastillo: Gaetano Net Other Clinician: Referring Rheannon Cerney: Gaetano Net Treating Charla Criscione/Extender: Tito Dine in Treatment: 8 Active Inactive ` Abuse / Safety / Falls / Self Care Management Nursing Diagnoses: Potential for falls Goals: Patient will remain injury free related to falls Date Initiated: 12/28/2017 Target Resolution Date: 04/08/2018 Goal Status: Active Interventions: Assess fall risk on admission and as needed Notes: ` Nutrition Nursing Diagnoses: Impaired glucose control: actual or potential Goals: Patient/caregiver verbalizes understanding of need to maintain therapeutic glucose control per primary care physician Date Initiated: 12/28/2017 Target Resolution Date: 04/08/2018 Goal Status: Active Interventions: Provide education on elevated blood sugars and impact on wound healing Notes: ` Orientation to the Wound Care Program Nursing Diagnoses: Knowledge deficit related to the wound healing center program Goals: Patient/caregiver will verbalize understanding of the [facility] Date Initiated: 12/28/2017 Target Resolution Date: 04/08/2018 Goal Status: Active Interventions: Wander, Sabastion A. ([id number]) Provide education on orientation to the wound center Notes: ` Wound/Skin Impairment Nursing Diagnoses: Impaired tissue integrity Goals: Ulcer/skin breakdown will heal within 14 weeks Date Initiated: 12/28/2017 Target Resolution Date: 04/08/2018 Goal Status: Active Interventions: Assess patient/caregiver ability to obtain necessary supplies Assess patient/caregiver ability to perform ulcer/skin care regimen upon admission and as needed Assess ulceration(s) every visit Notes: Electronic Signature(s) Signed: 02/22/2018 5:44:56 PM By: Gretta Cool, BSN, RN, CWS, Kim RN, BSN Entered By: Gretta Cool, BSN, RN, CWS, Kim on  02/22/2018 11:07:24 Silverman, Cliford A. ([id number]) -------------------------------------------------------------------------------- Pain Assessment Details Patient Name: Kellis, Tatum A. Date of Service: 02/22/2018 10:15 AM Medical Record Number: [id number] Patient Account Number: [id number] Date of Birth/Sex: 25-Dec-1970 (47 y.o. M) Treating RN: Roger Shelter Primary Care Tashyra Adduci: Gaetano Net Other Clinician: Referring Sabrinia Prien: Gaetano Net Treating Yusuke Beza/Extender: Tito Dine in Treatment: 8 Active Problems Location of Pain Severity and Description of Pain Patient Has Paino No Site Locations Pain Management and Medication Current Pain Management: Electronic Signature(s) Signed: 02/22/2018 5:20:09 PM By: Roger Shelter Entered By: Roger Shelter on 02/22/2018 10:25:54 Hughley, Nahmir A. ([id number]) -------------------------------------------------------------------------------- Patient/Caregiver Education Details Patient Name: [name], [name] Date of Service: 02/22/2018 10:15 AM Medical Record Number: [id number] Patient Account Number: [id number] Date of Birth/Gender: [birth year] (47 y.o. M) Treating RN: Roger Shelter Primary Care Physician: Gaetano Net Other Clinician: Referring Physician: Gaetano Net Treating Physician/Extender: Tito Dine in Treatment: 8 Education Assessment Education Provided To: Patient Education Topics Provided Wound/Skin Impairment: Handouts: Caring for Your Ulcer Methods: Explain/Verbal Responses: State content correctly Electronic Signature(s) Signed: 02/22/2018 5:20:09 PM By: Roger Shelter Entered By: Roger Shelter on 02/22/2018 11:31:52 [name], [name] ([id number]) -------------------------------------------------------------------------------- Wound Assessment Details Patient Name: [name], [name] Date of Service: 02/22/2018 10:15 AM Medical Record Number: [id number] Patient Account Number:  [id number] Date of Birth/Sex: 1971/03/26 (47 y.o. M) Treating RN: Roger Shelter Primary Care Mairany Bruno: Gaetano Net Other Clinician: Referring Nahima Ales: Gaetano Net Treating Amiliah Campisi/Extender: Tito Dine in Treatment: 8 Wound Status Wound Number: 2 Primary Etiology: Diabetic Wound/Ulcer of the Lower Extremity Wound Location: Left, Lateral, Posterior Lower Leg Secondary Lymphedema Wounding Event: Gradually Appeared Etiology: Date Acquired: 09/30/2017 Wound Status: Open Weeks Of Treatment: 8 Clustered Wound: No Photos Photo Uploaded By: Roger Shelter  on 02/22/2018 11:53:28 Wound Measurements Length: (cm) 2.5 Width: (cm) 5.4 Depth: (cm) 0.1 Area: (cm) 10.603 Volume: (cm) 1.06 % Reduction in Area: 62.5% % Reduction in Volume: 90.6% Wound Description Classification: Grade 2 Periwound Skin Texture Texture Color No Abnormalities Noted: No No Abnormalities Noted: No Moisture No Abnormalities Noted: No Treatment Notes Wound #2 (Left, Lateral, Posterior Lower Leg) 1. Cleansed with: Clean wound with Normal Saline 2. Anesthetic Topical Lidocaine 4% cream to wound bed prior to debridement 3. Peri-wound Care: Haddox, Chapin A. ([id number]) Moisturizing lotion 4. Dressing Applied: Other dressing (specify in notes) 5. Secondary Dressing Applied ABD Pad 7. Secured with 4 Layer Compression System - Bilateral Electronic Signature(s) Signed: 02/22/2018 5:20:09 PM By: Roger Shelter Entered By: Roger Shelter on 02/22/2018 10:46:50 Tony, Tanner A. ([id number]) -------------------------------------------------------------------------------- Wound Assessment Details Patient Name: [name], [name] Date of Service: 02/22/2018 10:15 AM Medical Record Number: [id number] Patient Account Number: [id number] Date of Birth/Sex: [birth year] (47 y.o. M) Treating RN: Roger Shelter Primary Care Jasman Murri: Gaetano Net Other Clinician: Referring Ram Haugan: Gaetano Net Treating Nishka Heide/Extender: Tito Dine in Treatment: 8 Wound Status Wound Number: 4 Primary Etiology: Lymphedema Wound Location: Right Lower Leg - Anterior Wound Status: Open Wounding Event: Gradually Appeared Comorbid Hypertension, Type II Diabetes, History: Neuropathy Date Acquired: 01/25/2018 Weeks Of Treatment: 4 Clustered Wound: No Photos Photo Uploaded By: Roger Shelter on 02/22/2018 11:57:47 Wound Measurements Length: (cm) 3 Width: (cm) 1.8 Depth: (cm) 0.1 Area: (cm) 4.241 Volume: (cm) 0.424 % Reduction in Area: -8923.4% % Reduction in Volume: -8380% Epithelialization: None Tunneling: No Undermining: No Wound Description Full Thickness Without Exposed Support Classification: Structures Wound Margin: Distinct, outline attached Exudate Large Amount: Exudate Type: Purulent Exudate Color: yellow, brown, green Slough/Fibrino Yes Wound Bed Granulation Amount: Large (67-100%) Exposed Structure Granulation Quality: Red Fascia Exposed: No Necrotic Amount: Small (1-33%) Fat Layer (Subcutaneous Tissue) Exposed: No Necrotic Quality: Eschar Tendon Exposed: No Muscle Exposed: No Joint Exposed: No Bone Exposed: No Engelhard, Oshen A. ([id number]) Periwound Skin Texture Texture Color No Abnormalities Noted: No No Abnormalities Noted: No Callus: No Atrophie Blanche: No Crepitus: No Cyanosis: No Excoriation: No Ecchymosis: No Induration: No Erythema: No Rash: No Hemosiderin Staining: No Scarring: No Mottled: No Pallor: No Moisture Rubor: No No Abnormalities Noted: No Dry / Scaly: No Temperature / Pain Maceration: No Temperature: No Abnormality Tenderness on Palpation: Yes Wound Preparation Ulcer Cleansing: Rinsed/Irrigated with Saline Topical Anesthetic Applied: Other: lidocaine 4%, Treatment Notes Wound #4 (Right, Anterior Lower Leg) 1. Cleansed with: Clean wound with Normal Saline 2. Anesthetic Topical Lidocaine 4% cream to  wound bed prior to debridement 3. Peri-wound Care: Moisturizing lotion 4. Dressing Applied: Other dressing (specify in notes) 5. Secondary Dressing Applied ABD Pad 7. Secured with 4 Layer Compression System - Bilateral Electronic Signature(s) Signed: 02/22/2018 5:20:09 PM By: Roger Shelter Entered By: Roger Shelter on 02/22/2018 10:46:22 Deshong, Gary A. ([id number]) -------------------------------------------------------------------------------- Wound Assessment Details Patient Name: [name], [name] Date of Service: 02/22/2018 10:15 AM Medical Record Number: [id number] Patient Account Number: [id number] Date of Birth/Sex: 03/09/1971 (47 y.o. M) Treating RN: Roger Shelter Primary Care Bethann Qualley: Gaetano Net Other Clinician: Referring Olubunmi Rothenberger: Gaetano Net Treating Shatia Sindoni/Extender: Tito Dine in Treatment: 8 Wound Status Wound Number: 5 Primary Etiology: Lymphedema Wound Location: Right, Medial Lower Leg Secondary Diabetic Wound/Ulcer of the Lower Etiology: Extremity Wounding Event: Blister Wound Status: Open Date Acquired: 02/01/2018 Weeks Of Treatment: 3 Clustered Wound: Yes Photos Photo Uploaded By: Roger Shelter on 02/22/2018 11:55:27 Wound Measurements Length: (cm) 0.7  Width: (cm) 0.7 Depth: (cm) 0.1 Area: (cm) 0.385 Volume: (cm) 0.038 % Reduction in Area: 96.4% % Reduction in Volume: 96.5% Wound Description Full Thickness Without Exposed Support Classification: Structures Periwound Skin Texture Texture Color No Abnormalities Noted: No No Abnormalities Noted: No Moisture No Abnormalities Noted: No Treatment Notes Wound #5 (Right, Medial Lower Leg) 1. Cleansed with: Clean wound with Normal Saline 2. Anesthetic Topical Lidocaine 4% cream to wound bed prior to debridement Lesperance, Whitt A. ([id number]) 3. Peri-wound Care: Moisturizing lotion 4. Dressing Applied: Other dressing (specify in notes) 5. Secondary Dressing  Applied ABD Pad 7. Secured with 4 Layer Compression System - Bilateral Electronic Signature(s) Signed: 02/22/2018 5:20:09 PM By: Roger Shelter Entered By: Roger Shelter on 02/22/2018 10:47:37 [name], [name] ([id number]) -------------------------------------------------------------------------------- Wound Assessment Details Patient Name: [name], [name] Date of Service: 02/22/2018 10:15 AM Medical Record Number: [id number] Patient Account Number: [id number] Date of Birth/Sex: June 20, 1971 (47 y.o. M) Treating RN: Roger Shelter Primary Care Jarissa Sheriff: Gaetano Net Other Clinician: Referring Phill Steck: Gaetano Net Treating Dandra Shambaugh/Extender: Tito Dine in Treatment: 8 Wound Status Wound Number: 6 Primary Etiology: Lymphedema Wound Location: Right, Lateral Lower Leg Wound Status: Open Wounding Event: Gradually Appeared Date Acquired: 02/08/2018 Weeks Of Treatment: 2 Clustered Wound: No Photos Photo Uploaded By: Roger Shelter on 02/22/2018 11:55:54 Wound Measurements Length: (cm) 0.2 Width: (cm) 0.2 Depth: (cm) 0.1 Area: (cm) 0.031 Volume: (cm) 0.003 % Reduction in Area: 96.1% % Reduction in Volume: 96.2% Wound Description Full Thickness Without Exposed Support Classification: Structures Periwound Skin Texture Texture Color No Abnormalities Noted: No No Abnormalities Noted: No Moisture No Abnormalities Noted: No Treatment Notes Wound #6 (Right, Lateral Lower Leg) 1. Cleansed with: Clean wound with Normal Saline 2. Anesthetic Topical Lidocaine 4% cream to wound bed prior to debridement [name], Aser A. ([id number]) 3. Peri-wound Care: Moisturizing lotion 4. Dressing Applied: Other dressing (specify in notes) 5. Secondary Dressing Applied ABD Pad 7. Secured with 4 Layer Compression System - Bilateral Electronic Signature(s) Signed: 02/22/2018 5:20:09 PM By: Roger Shelter Entered By: Roger Shelter on 02/22/2018 10:47:21 Nickle, Ronith  A. ([id number]) -------------------------------------------------------------------------------- Wound Assessment Details Patient Name: Malak, Sagar A. Date of Service: 02/22/2018 10:15 AM Medical Record Number: [id number] Patient Account Number: [id number] Date of Birth/Sex: June 18, 1971 (47 y.o. M) Treating RN: Roger Shelter Primary Care Arielys Wandersee: Gaetano Net Other Clinician: Referring Frenchie Dangerfield: Gaetano Net Treating Mayrin Schmuck/Extender: Tito Dine in Treatment: 8 Wound Status Wound Number: 7 Primary Etiology: Lymphedema Wound Location: Left Lower Leg - Lateral, Proximal Wound Status: Open Wounding Event: Gradually Appeared Comorbid Hypertension, Type II Diabetes, History: Neuropathy Date Acquired: 02/15/2018 Weeks Of Treatment: 1 Clustered Wound: No Photos Photo Uploaded By: Roger Shelter on 02/22/2018 11:56:20 Wound Measurements Length: (cm) 1.5 Width: (cm) 2 Depth: (cm) 0.1 Area: (cm) 2.356 Volume: (cm) 0.236 % Reduction in Area: -172.7% % Reduction in Volume: -174.4% Epithelialization: None Tunneling: No Undermining: No Wound Description Full Thickness Without Exposed Support Classification: Structures Wound Margin: Flat and Intact Exudate Large Amount: Exudate Type: Sanguinous Exudate Color: red Foul Odor After Cleansing: No Slough/Fibrino Yes Wound Bed Granulation Amount: Large (67-100%) Exposed Structure Granulation Quality: Red Fascia Exposed: No Necrotic Amount: Small (1-33%) Fat Layer (Subcutaneous Tissue) Exposed: Yes Necrotic Quality: Eschar Tendon Exposed: No Muscle Exposed: No Joint Exposed: No Bone Exposed: No Zapata, Xayden A. ([id number]) Periwound Skin Texture Texture Color No Abnormalities Noted: No No Abnormalities Noted: No Callus: No Atrophie Blanche: No Crepitus: No Cyanosis: No Excoriation: No Ecchymosis: No Induration: No Erythema: No Rash: No  Hemosiderin Staining: No Scarring: No Mottled: No Pallor:  No Moisture Rubor: No No Abnormalities Noted: No Dry / Scaly: No Temperature / Pain Maceration: No Temperature: No Abnormality Tenderness on Palpation: Yes Wound Preparation Ulcer Cleansing: Rinsed/Irrigated with Saline Topical Anesthetic Applied: Other: lidocaine 4%, Treatment Notes Wound #7 (Left, Proximal, Lateral Lower Leg) 1. Cleansed with: Clean wound with Normal Saline 2. Anesthetic Topical Lidocaine 4% cream to wound bed prior to debridement 3. Peri-wound Care: Moisturizing lotion 4. Dressing Applied: Other dressing (specify in notes) 5. Secondary Dressing Applied ABD Pad 7. Secured with 4 Layer Compression System - Bilateral Electronic Signature(s) Signed: 02/22/2018 5:20:09 PM By: Roger Shelter Entered By: Roger Shelter on 02/22/2018 10:45:00 Twaddell, Pacey A. ([id number]) -------------------------------------------------------------------------------- Wound Assessment Details Patient Name: Ines, Alonte A. Date of Service: 02/22/2018 10:15 AM Medical Record Number: [id number] Patient Account Number: [id number] Date of Birth/Sex: Jun 08, 1971 (47 y.o. M) Treating RN: Roger Shelter Primary Care Kamerin Axford: Gaetano Net Other Clinician: Referring Lichelle Viets: Gaetano Net Treating Joanna Borawski/Extender: Tito Dine in Treatment: 8 Wound Status Wound Number: 8 Primary Etiology: Lymphedema Wound Location: Left Lower Leg - Posterior, Proximal Wound Status: Open Wounding Event: Gradually Appeared Comorbid Hypertension, Type II Diabetes, History: Neuropathy Date Acquired: 02/22/2018 Weeks Of Treatment: 0 Clustered Wound: Yes Photos Photo Uploaded By: Roger Shelter on 02/22/2018 11:56:43 Wound Measurements Length: (cm) 7 Width: (cm) 4.5 Depth: (cm) 0.1 Clustered Quantity: 3 Area: (cm) 24.74 Volume: (cm) 2.474 % Reduction in Area: % Reduction in Volume: Epithelialization: None Tunneling: No Undermining: No Wound Description Full Thickness  Without Exposed Support Classification: Structures Wound Margin: Distinct, outline attached Exudate Large Amount: Exudate Type: Serosanguineous Exudate Color: red, brown Foul Odor After Cleansing: No Slough/Fibrino Yes Wound Bed Granulation Amount: Medium (34-66%) Exposed Structure Granulation Quality: Red Fascia Exposed: No Necrotic Amount: Medium (34-66%) Fat Layer (Subcutaneous Tissue) Exposed: Yes Necrotic Quality: Eschar, Adherent Slough Tendon Exposed: No Muscle Exposed: No Joint Exposed: No Hoge, Cannan A. ([id number]) Bone Exposed: No Periwound Skin Texture Texture Color No Abnormalities Noted: No No Abnormalities Noted: No Callus: No Atrophie Blanche: No Crepitus: No Cyanosis: No Excoriation: No Ecchymosis: No Induration: No Erythema: No Rash: No Hemosiderin Staining: No Scarring: No Mottled: No Pallor: No Moisture Rubor: No No Abnormalities Noted: No Dry / Scaly: Yes Maceration: No Wound Preparation Ulcer Cleansing: Rinsed/Irrigated with Saline Topical Anesthetic Applied: Other: lidocaine 4%, Treatment Notes Wound #8 (Left, Proximal, Posterior Lower Leg) 1. Cleansed with: Clean wound with Normal Saline 2. Anesthetic Topical Lidocaine 4% cream to wound bed prior to debridement 3. Peri-wound Care: Moisturizing lotion 4. Dressing Applied: Other dressing (specify in notes) 5. Secondary Dressing Applied ABD Pad 7. Secured with 4 Layer Compression System - Bilateral Electronic Signature(s) Signed: 02/22/2018 5:20:09 PM By: Roger Shelter Entered By: Roger Shelter on 02/22/2018 10:44:20 Roat, Manly A. ([id number]) -------------------------------------------------------------------------------- Vitals Details Patient Name: Bonillas, Rilen A. Date of Service: 02/22/2018 10:15 AM Medical Record Number: [id number] Patient Account Number: [id number] Date of Birth/Sex: 03-Dec-1970 (47 y.o. M) Treating RN: Roger Shelter Primary [facility] [place]:  Gaetano Net Other Clinician: Referring Dayson Aboud: Gaetano Net Treating Malaiyah Achorn/Extender: Tito Dine in Treatment: 8 Vital Signs Time Taken: 10:28 Temperature (F): 98.2 Height (in): 76 Pulse (bpm): 58 Weight (lbs): 379.5 Respiratory Rate (breaths/min): 18 Body Mass Index (BMI): 46.2 Blood Pressure (mmHg): 172/81 Reference Range: 80 - 120 mg / dl Electronic Signature(s) Signed: 02/22/2018 5:20:09 PM By: Roger Shelter Entered By: Roger Shelter on 02/22/2018 10:28:35

## 2018-02-24 NOTE — Progress Notes (Signed)
MRN : [id number]  [name] is a 47 y.o. (1971-07-20) male who presents with chief complaint of  Chief Complaint  Patient presents with  . Follow-up    3-4week follow up  .  History of Present Illness: Patient is seen for follow up evaluation of leg pain and swelling associated with venous ulceration.Since his last visit he has been treated as an outpatient with a Unna wraps.The swelling and chronic lymphedema changes continue to be worse on the left legand is associated with pain and discoloration. The pain and swelling worsens with prolonged dependency and improves with elevation. The patient notes that in the morning the legs are better but the leg symptoms worsened throughout the course of the day. The patient has also noted a progressive worsening of the discoloration in the ankle and shin area.   The patient notes that has been able to tolerate the Unna boot (3 layer wrap) The patient states that they have been elevating as much as possible. The patient denies any recent changes in medications.  The patient denies a history of DVT or PE. There is no prior history of phlebitis. There is no history of primary lymphedema.  No SOB or increased cough. No sputum production. No recent episodes of CHF exacerbation.    Current Meds  Medication Sig  . amLODipine (NORVASC) 10 MG tablet Take by mouth.  [name] atenolol (TENORMIN) 100 MG tablet Take 1 tablet (100 mg total) by mouth daily.  . cephALEXin (KEFLEX) 500 MG capsule Take 500 mg by mouth 2 (two) times daily.  [name] gentamicin cream (GARAMYCIN) 0.1 % Apply 1 application topically 3 (three) times daily.  . hydrALAZINE (APRESOLINE) 25 MG tablet Take 25 mg by mouth 3 (three) times daily.   . Insulin Glargine (LANTUS SOLOSTAR) 100 UNIT/ML Solostar Pen Inject 24 Units into the skin.   [name] torsemide (DEMADEX) 20 MG tablet TAKE 2 TABLETS BY MOUTH EVERY DAY IN THE MORNING    Past Medical History:  Diagnosis Date  . Diabetes  mellitus without complication ([place])   . Hypertension   . Microalbuminuria   . Vitamin D deficiency     Past Surgical History:  Procedure Laterality Date  . APPLICATION OF WOUND VAC Left 10/03/2017   Procedure: APPLICATION OF WOUND VAC;  Surgeon: Algernon Huxley, MD;  Location: [facility];  Service: General;  Laterality: Left;  . APPLICATION OF WOUND VAC Left 10/11/2017   Procedure: APPLICATION OF WOUND VAC;  Surgeon: Katha Cabal, MD;  Location: [facility];  Service: Vascular;  Laterality: Left;  . APPLICATION OF WOUND VAC Left 10/14/2017   Procedure: WOUND VAC CHANGE;  Surgeon: Katha Cabal, MD;  Location: [facility];  Service: Vascular;  Laterality: Left;  left lower leg  . APPLICATION OF WOUND VAC Left 10/18/2017   Procedure: WOUND VAC CHANGE;  Surgeon: Katha Cabal, MD;  Location: [facility];  Service: Vascular;  Laterality: Left;  . APPLICATION OF WOUND VAC Left 10/07/2017   Procedure: APPLICATION OF WOUND VAC;  Surgeon: Katha Cabal, MD;  Location: [facility];  Service: Vascular;  Laterality: Left;  . I&D EXTREMITY Left 10/11/2017   Procedure: IRRIGATION AND DEBRIDEMENT EXTREMITY;  Surgeon: Katha Cabal, MD;  Location: [facility];  Service: Vascular;  Laterality: Left;  . INCISION AND DRAINAGE ABSCESS Right 10/07/2017   Procedure: INCISION AND DRAINAGE ABSCESS;  Surgeon: Katha Cabal, MD;  Location: [facility];  Service: Vascular;  Laterality: Right;  . IRRIGATION AND DEBRIDEMENT  ABSCESS Left 10/03/2017   Procedure: IRRIGATION AND DEBRIDEMENT ABSCESS with debridement of skin, soft tissue, muscle 50sq cm;  Surgeon: Algernon Huxley, MD;  Location: [facility];  Service: General;  Laterality: Left;    Social History Social History   Tobacco Use  . Smoking status: Never Smoker  . Smokeless tobacco: Never Used  Substance Use Topics  . Alcohol use: Yes    Comment: beer  . Drug use: Not on file    Family History Family History  Problem Relation Age of Onset  . Diabetes Father    . Kidney disease Father   . Diabetes Daughter     Allergies  Allergen Reactions  . Lisinopril Swelling  . Furosemide Other (See Comments)    Light headed, cough, blurry vision, weakness.     REVIEW OF SYSTEMS (Negative unless checked)  Constitutional: [] Weight loss  [] Fever  [] Chills Cardiac: [] Chest pain   [] Chest pressure   [] Palpitations   [] Shortness of breath when laying flat   [x] Shortness of breath with exertion. Vascular:  [] Pain in legs with walking   [x] Pain in legs at rest  [] History of DVT   [] Phlebitis   [x] Swelling in legs   [] Varicose veins   [x] Non-healing ulcers Pulmonary:   [] Uses home oxygen   [] Productive cough   [] Hemoptysis   [] Wheeze  [] COPD   [] Asthma Neurologic:  [] Dizziness   [] Seizures   [] History of stroke   [] History of TIA  [] Aphasia   [] Vissual changes   [] Weakness or numbness in arm   [] Weakness or numbness in leg Musculoskeletal:   [] Joint swelling   [] Joint pain   [] Low back pain Hematologic:  [] Easy bruising  [] Easy bleeding   [] Hypercoagulable state   [] Anemic Gastrointestinal:  [] Diarrhea   [] Vomiting  [] Gastroesophageal reflux/heartburn   [] Difficulty swallowing. Genitourinary:  [x] Chronic kidney disease   [] Difficult urination  [] Frequent urination   [] Blood in urine Skin:  [x] Rashes   [x] Ulcers  Psychological:  [] History of anxiety   []  History of major depression.  Physical Examination  Vitals:   02/23/18 1459  BP: (!) 176/83  Pulse: 63  Resp: 17  Weight: (!) 382 lb 12.8 oz (173.6 kg)  Height: 6\' 4"  (1.93 m)   Body mass index is 46.6 kg/m. Gen: WD/WN, NAD Head: [place]/AT, No temporalis wasting.  Ear/Nose/Throat: Hearing grossly intact, nares w/o erythema or drainage Eyes: PER, EOMI, sclera nonicteric.  Neck: Supple, no large masses.   Pulmonary:  Good air movement, no audible wheezing bilaterally, no use of accessory muscles.  Cardiac: RRR, no JVD Vascular: scattered varicosities present bilaterally.  Sever venous stasis changes to  the legs bilaterally.  3+ soft pitting edema right leg 4+ hard edema left leg ulcer lateral ankle much smaller flush with skin and epithelializing well Vessel Right Left  Radial Palpable Palpable  PT Not Palpable Not Palpable  DP Palpable Palpable  Gastrointestinal: Non-distended. No guarding/no peritoneal signs.  Musculoskeletal: M/S 5/5 throughout.  No deformity or atrophy.  Neurologic: CN 2-12 intact. Symmetrical.  Speech is fluent. Motor exam as listed above. Psychiatric: Judgment intact, Mood & affect appropriate for pt's clinical situation. Dermatologic: Severe venous  rashes with improved ulcers noted.  No changes consistent with cellulitis. Lymph : No lichenification or skin changes of chronic lymphedema.  CBC Lab Results  Component Value Date   WBC 5.9 10/19/2017   HGB 8.4 (L) 10/19/2017   HCT 25.6 (L) 10/19/2017   MCV 89.1 10/19/2017   PLT 320 10/19/2017    BMET  Component Value Date/Time   NA 139 10/19/2017 0311   K 4.2 10/19/2017 0311   CL 107 10/19/2017 0311   CO2 24 10/19/2017 0311   GLUCOSE 109 (H) 10/19/2017 0311   BUN 37 (H) 10/19/2017 0311   CREATININE 2.11 (H) 10/19/2017 0311   CALCIUM 8.6 (L) 10/19/2017 0311   GFRNONAA 35 (L) 10/19/2017 0311   GFRAA 41 (L) 10/19/2017 0311   CrCl cannot be calculated (Patient's most recent lab result is older than the maximum 21 days allowed.).  COAG Lab Results  Component Value Date   INR 1.05 10/14/2017   INR 1.16 10/06/2017    Radiology No results found.   Assessment/Plan 1. Venous ulcer ([place]) No surgery or intervention at this point in time.   I have had a long discussion with the patient regarding venous insufficiency and why it causes symptoms, specifically venous ulceration . I have discussed with the patient the chronic skin changes that accompany venous insufficiency and the long term sequela such as infection and recurring ulceration. Patient will continue [employer] which will be changed weekly  drainage permitting.  He is using his lymph pump for 3 hours per day  In addition, behavioral modification including several periods of elevation of the lower extremities during the day will be continued. Achieving a position with the ankles at heart level was stressed to the patient  The patient is instructed to begin routine exercise, especially walking on a daily basis    2. Chronic venous insufficiency No surgery or intervention at this point in time.    I have had a long discussion with the patient regarding venous insufficiency and why it  causes symptoms. I have discussed with the patient the chronic skin changes that accompany venous insufficiency and the long term sequela such as infection and ulceration.  Patient will begin wearing graduated compression stockings class 1 (20-30 mmHg) or compression wraps on a daily basis once his ulcer is healed. The patient will put the stockings on first thing in the morning and removing them in the evening. The patient is instructed specifically not to sleep in the stockings.    In addition, behavioral modification including several periods of elevation of the lower extremities during the day will be continued. I have demonstrated that proper elevation is a position with the ankles at heart level.  The patient is instructed to begin routine exercise, especially walking on a daily basis  3. Lymphedema  No surgery or intervention at this point in time.    I have reviewed my discussion with the patient regarding lymphedema and why it  causes symptoms.  Patient will continue wearing graduated compression stockings class 1 (20-30 mmHg) on a daily basis a prescription was given. The patient is reminded to put the stockings on first thing in the morning and removing them in the evening. The patient is instructed specifically not to sleep in the stockings.   In addition, behavioral modification throughout the day will be continued.  This will  include frequent elevation (such as in a recliner), use of over the counter pain medications as needed and exercise such as walking.  I have reviewed systemic causes for chronic edema such as liver, kidney and cardiac etiologies and there does not appear to be any significant changes in these organ systems over the past year.  The patient is under the impression that these organ systems are all stable and unchanged.    The patient will continue aggressive use of the  lymph pump.  This will continue to improve the edema control and prevent sequela such as ulcers and infections.   4. Essential hypertension Continue antihypertensive medications as already ordered, these medications have been reviewed and there are no changes at this time.   5. Diabetes mellitus without complication ([place]) Continue hypoglycemic medications as already ordered, these medications have been reviewed and there are no changes at this time.  Hgb A1C to be monitored as already arranged by primary service    [name], MD  02/24/2018 8:02 AM

## 2018-02-25 LAB — AEROBIC CULTURE W GRAM STAIN (SUPERFICIAL SPECIMEN)

## 2018-02-25 LAB — AEROBIC CULTURE  (SUPERFICIAL SPECIMEN)

## 2018-03-01 ENCOUNTER — Encounter: Payer: BLUE CROSS/BLUE SHIELD | Attending: Internal Medicine | Admitting: Internal Medicine

## 2018-03-01 DIAGNOSIS — L97223 Non-pressure chronic ulcer of left calf with necrosis of muscle: Secondary | ICD-10-CM | POA: Insufficient documentation

## 2018-03-01 DIAGNOSIS — L97211 Non-pressure chronic ulcer of right calf limited to breakdown of skin: Secondary | ICD-10-CM | POA: Insufficient documentation

## 2018-03-01 DIAGNOSIS — Z8619 Personal history of other infectious and parasitic diseases: Secondary | ICD-10-CM | POA: Insufficient documentation

## 2018-03-01 DIAGNOSIS — I110 Hypertensive heart disease with heart failure: Secondary | ICD-10-CM | POA: Insufficient documentation

## 2018-03-01 DIAGNOSIS — E11622 Type 2 diabetes mellitus with other skin ulcer: Secondary | ICD-10-CM | POA: Insufficient documentation

## 2018-03-01 DIAGNOSIS — Z881 Allergy status to other antibiotic agents status: Secondary | ICD-10-CM | POA: Insufficient documentation

## 2018-03-01 DIAGNOSIS — E1140 Type 2 diabetes mellitus with diabetic neuropathy, unspecified: Secondary | ICD-10-CM | POA: Insufficient documentation

## 2018-03-03 ENCOUNTER — Telehealth (INDEPENDENT_AMBULATORY_CARE_PROVIDER_SITE_OTHER): Payer: Self-pay | Admitting: Vascular Surgery

## 2018-03-03 NOTE — Telephone Encounter (Signed)
I spoke with Anderson Malta and inform her that we have received the fax and due to the holiday the patient provider will not be in the office until Monday and everything will be fax once completed

## 2018-03-03 NOTE — Progress Notes (Signed)
[name], [name] ([id number]) Visit Report for 03/01/2018 Arrival Information Details Patient Name: [name], [name] Date of Service: 03/01/2018 9:15 AM Medical Record Number: [id number] Patient Account Number: [id number] Date of Birth/Sex: [birth year] (47 y.o. M) Treating RN: [name] Primary Care [name]: [name] Other Clinician: Referring Kellyn Mansfield: [name] Treating Dick Hark/Extender: [name] in Treatment: 9 Visit Information History Since Last Visit All ordered tests and consults were completed: No Patient Arrived: Ambulatory Added or deleted any medications: No Arrival Time: 09:31 Any new allergies or adverse reactions: No Accompanied By: self Had a fall or experienced change in No Transfer Assistance: None activities of daily living that may affect Patient Identification Verified: Yes risk of falls: Secondary Verification Process Yes Signs or symptoms of abuse/neglect since last visito No Completed: Hospitalized since last visit: No Patient Requires Transmission-Based No Implantable device outside of the clinic excluding No Precautions: cellular tissue based products placed in the center Patient Has Alerts: Yes since last visit: Patient Alerts: DM II Has Dressing in Place as Prescribed: Yes noncompressible Has Compression in Place as Prescribed: Yes bilateral Pain Present Now: No Electronic Signature(s) Signed: 03/01/2018 3:49:29 PM By: [name] Entered By: [name] on 03/01/2018 09:31:59 [name], [name] ([id number]) -------------------------------------------------------------------------------- Encounter Discharge Information Details Patient Name: Gambale, Nikai A. Date of Service: 03/01/2018 9:15 AM Medical Record Number: [id number] Patient Account Number: [id number] Date of Birth/Sex: 1971/08/23 (47 y.o. M) Treating RN: [name] Primary Care Pascha Fogal: [name] Other Clinician: Referring Nayel Purdy: [name] Treating Marranda Arakelian/Extender: [name] in Treatment: 9 Encounter Discharge Information Items Discharge Condition: Stable Ambulatory Status: Ambulatory Discharge Destination: Home Transportation: Private Auto Accompanied By: self Schedule Follow-up Appointment: Yes Clinical Summary of Care: Electronic Signature(s) Signed: 03/01/2018 12:04:46 PM By: [name] Entered By: [name] on 03/01/2018 12:04:45 [name], [name] ([id number]) -------------------------------------------------------------------------------- Lower Extremity Assessment Details Patient Name: Kuechle, Sadarius A. Date of Service: 03/01/2018 9:15 AM Medical Record Number: [id number] Patient Account Number: [id number] Date of Birth/Sex: 1971-06-07 (47 y.o. M) Treating RN: [name] Primary Care Constant Mandeville: [name] Other Clinician: Referring Lovell Nuttall: [name] Treating Isaak Delmundo/Extender: [name] in Treatment: 9 Edema Assessment Assessed: [Left: No] [Right: No] Edema: [Left: Yes] [Right: Yes] Calf Left: Right: Point of Measurement: 42 cm From Medial Instep 59.3 cm 51.8 cm Ankle Left: Right: Point of Measurement: 12 cm From Medial Instep 49.5 cm 38.2 cm Vascular Assessment Pulses: Dorsalis Pedis Palpable: [Left:Yes] [Right:Yes] Posterior Tibial Extremity colors, hair growth, and conditions: Extremity Color: [Left:Red] [Right:Red] Hair Growth on Extremity: [Left:Yes] [Right:Yes] Temperature of Extremity: [Left:Warm] [Right:Warm] Capillary Refill: [Left:< 3 seconds] [Right:< 3 seconds] Toe Nail Assessment Left: Right: Thick: Yes Yes Discolored: Yes Yes Deformed: No No Improper Length and Hygiene: Yes Yes Electronic Signature(s) Signed: 03/01/2018 3:49:29 PM By: [name] Entered By: [name] on 03/01/2018 09:41:15 [name], [name] ([id number]) -------------------------------------------------------------------------------- Multi Wound Chart  Details Patient Name: [name], [name] Date of Service: 03/01/2018 9:15 AM Medical Record Number: [id number] Patient Account Number: [id number] Date of Birth/Sex: 02/07/1971 (47 y.o. M) Treating RN: [name] Primary Care Raileigh Sabater: [name] Other Clinician: Referring Evonna Stoltz: [name] Treating Devlon Dosher/Extender: [name] in Treatment: 9 Vital Signs Height(in): 76 Pulse(bpm): 38 Weight(lbs): 379.5 Blood Pressure(mmHg): 185/87 Body Mass Index(BMI): 46 Temperature(F): 98.1 Respiratory Rate 18 (breaths/min): Photos: [2:No Photos] [4:No Photos] [5:No Photos] Wound Location: [2:Left Lower Leg - Lateral, Posterior] [4:Right, Anterior Lower Leg] [5:Right, Medial Lower Leg] Wounding Event: [2:Gradually Appeared] [4:Gradually Appeared] [5:Blister] Primary  Etiology: [2:Diabetic Wound/Ulcer of the Lower Extremity] [4:Lymphedema] [5:Lymphedema] Secondary Etiology: [2:Lymphedema] [4:N/A] [5:Diabetic Wound/Ulcer of the Lower Extremity] Comorbid History: [2:Hypertension, Type II Diabetes, Neuropathy] [4:N/A] [5:N/A] Date Acquired: [2:09/30/2017] [4:01/25/2018] [5:02/01/2018] Weeks of Treatment: [2:9] [4:5] [5:4] Wound Status: [2:Open] [4:Healed - Epithelialized] [5:Healed - Epithelialized] Clustered Wound: [2:No] [4:No] [5:Yes] Clustered Quantity: [2:N/A] [4:N/A] [5:N/A] Measurements L x W x D [2:2.5x6.5x1] [4:0x0x0] [5:0x0x0] (cm) Area (cm) : [2:12.763] [4:0] [5:0] Volume (cm) : [2:12.763] [4:0] [5:0] % Reduction in Area: [2:54.90%] [4:100.00%] [5:100.00%] % Reduction in Volume: [2:-12.80%] [4:100.00%] [5:100.00%] Position 1 (o'clock): [2:12] Maximum Distance 1 (cm): [2:1.2] Tunneling: [2:Yes] [4:N/A] [5:N/A] Classification: [2:Grade 2] [4:Full Thickness Without Exposed Support Structures] [5:Full Thickness Without Exposed Support Structures] Exudate Amount: [2:Large] [4:N/A] [5:N/A] Exudate Type: [2:Serosanguineous] [4:N/A] [5:N/A] Exudate Color: [2:red, brown]  [4:N/A] [5:N/A] Wound Margin: [2:Distinct, outline attached] [4:N/A] [5:N/A] Granulation Amount: [2:Medium (34-66%)] [4:N/A] [5:N/A] Granulation Quality: [2:Red] [4:N/A] [5:N/A] Necrotic Amount: [2:Medium (34-66%)] [4:N/A] [5:N/A] Necrotic Tissue: [2:Adherent Slough] [4:N/A] [5:N/A] Exposed Structures: [2:Fascia: No Fat Layer (Subcutaneous Tissue) Exposed: No] [4:N/A] [5:N/A] Tendon: No Muscle: No Joint: No Bone: No Epithelialization: None N/A N/A Periwound Skin Texture: No Abnormalities Noted No Abnormalities Noted No Abnormalities Noted Periwound Skin Moisture: Maceration: Yes No Abnormalities Noted No Abnormalities Noted Periwound Skin Color: No Abnormalities Noted No Abnormalities Noted No Abnormalities Noted Temperature: No Abnormality N/A N/A Tenderness on Palpation: Yes No No Wound Preparation: Ulcer Cleansing: N/A N/A Rinsed/Irrigated with Saline Topical Anesthetic Applied: Other: lidocaine 4% Wound Number: [id number]  Photos: No Photos No Photos No Photos Wound Location: Right, Lateral Lower Leg Left Lower Leg - Medial, Left Lower Leg - Posterior, Proximal Proximal Wounding Event: Gradually Appeared Gradually Appeared Gradually Appeared Primary Etiology: Lymphedema Lymphedema Lymphedema Secondary Etiology: N/A N/A N/A Comorbid History: N/A Hypertension, Type II Hypertension, Type II Diabetes, Neuropathy Diabetes, Neuropathy Date Acquired: 02/08/2018 02/15/2018 02/22/2018 Weeks of Treatment: 3 2 1  Wound Status: Healed - Epithelialized Open Open Clustered Wound: No No Yes Clustered Quantity: N/A N/A 3 Measurements L x W x D 0x0x0 0.7x1.2x0.1 1.9x0.5x0.1 (cm) Area (cm) : 0 0.66 0.746 Volume (cm) : 0 0.066 0.075 % Reduction in Area: 100.00% 23.60% 97.00% % Reduction in Volume: 100.00% 23.30% 97.00% Tunneling: N/A No No Classification: Full Thickness Without Full Thickness Without Full Thickness Without Exposed Support Structures Exposed Support Structures Exposed Support  Structures Exudate Amount: N/A Large Large Exudate Type: N/A Serosanguineous Serosanguineous Exudate Color: N/A red, brown red, brown Wound Margin: N/A Flat and Intact Distinct, outline attached Granulation Amount: N/A Large (67-100%) Medium (34-66%) Granulation Quality: N/A Red Red Necrotic Amount: N/A Small (1-33%) Medium (34-66%) Necrotic Tissue: N/A Eschar Eschar, Adherent Slough Exposed Structures: N/A Fat Layer (Subcutaneous Fat Layer (Subcutaneous Tissue) Exposed: Yes Tissue) Exposed: Yes Fascia: No Fascia: No Tendon: No Tendon: No Muscle: No Muscle: No Joint: No Joint: No Bone: No Bone: No Epithelialization: N/A None None Periwound Skin Texture: No Abnormalities Noted Excoriation: No Excoriation: No Induration: No Induration: No Chenoweth, Yony A. ([id number]) Callus: No Callus: No Crepitus: No Crepitus: No Rash: No Rash: No Scarring: No Scarring: No Periwound Skin Moisture: No Abnormalities Noted Maceration: No Dry/Scaly: Yes Dry/Scaly: No Maceration: No Periwound Skin Color: No Abnormalities Noted Atrophie Blanche: No Atrophie Blanche: No Cyanosis: No Cyanosis: No Ecchymosis: No Ecchymosis: No Erythema: No Erythema: No Hemosiderin Staining: No Hemosiderin Staining: No Mottled: No Mottled: No Pallor: No Pallor: No Rubor: No Rubor: No Temperature: N/A No Abnormality N/A Tenderness on Palpation: No Yes No Wound Preparation: N/A Ulcer Cleansing:  Ulcer Cleansing: Rinsed/Irrigated with Saline Rinsed/Irrigated with Saline Topical Anesthetic Applied: Topical Anesthetic Applied: Other: lidocaine 4% Other: lidocaine 4% Treatment Notes Electronic Signature(s) Signed: 03/03/2018 8:34:07 AM By: Linton Ham MD Entered By: Linton Ham on 03/01/2018 10:49:58 [name], [name] Kitchen ([id number]) -------------------------------------------------------------------------------- Multi-Disciplinary Care Plan Details Patient Name: [name], [name] Date of  Service: 03/01/2018 9:15 AM Medical Record Number: [id number] Patient Account Number: [id number] Date of Birth/Sex: 15-Jan-1971 (47 y.o. M) Treating RN: [name] Primary Care Annikah Lovins: [name] Other Clinician: Referring Yolander Goodie: [name] Treating Terel Bann/Extender: [name] in Treatment: 9 Active Inactive ` Abuse / Safety / Falls / Self Care Management Nursing Diagnoses: Potential for falls Goals: Patient will remain injury free related to falls Date Initiated: 12/28/2017 Target Resolution Date: 04/08/2018 Goal Status: Active Interventions: Assess fall risk on admission and as needed Notes: ` Nutrition Nursing Diagnoses: Impaired glucose control: actual or potential Goals: Patient/caregiver verbalizes understanding of need to maintain therapeutic glucose control per primary care physician Date Initiated: 12/28/2017 Target Resolution Date: 04/08/2018 Goal Status: Active Interventions: Provide education on elevated blood sugars and impact on wound healing Notes: ` Orientation to the Wound Care Program Nursing Diagnoses: Knowledge deficit related to the wound healing center program Goals: Patient/caregiver will verbalize understanding of the [facility] Date Initiated: 12/28/2017 Target Resolution Date: 04/08/2018 Goal Status: Active Interventions: Accardi, Romano A. ([id number]) Provide education on orientation to the wound center Notes: ` Wound/Skin Impairment Nursing Diagnoses: Impaired tissue integrity Goals: Ulcer/skin breakdown will heal within 14 weeks Date Initiated: 12/28/2017 Target Resolution Date: 04/08/2018 Goal Status: Active Interventions: Assess patient/caregiver ability to obtain necessary supplies Assess patient/caregiver ability to perform ulcer/skin care regimen upon admission and as needed Assess ulceration(s) every visit Notes: Electronic Signature(s) Signed: 03/01/2018 4:43:24 PM By: Gretta Cool, BSN, RN, CWS, Kim RN,  BSN Entered By: Gretta Cool, BSN, RN, CWS, Kim on 03/01/2018 10:00:23 [name], Sonya [name] Kitchen ([id number]) -------------------------------------------------------------------------------- Pain Assessment Details Patient Name: Crompton, Dawud A. Date of Service: 03/01/2018 9:15 AM Medical Record Number: [id number] Patient Account Number: [id number] Date of Birth/Sex: Jul 28, 1971 (47 y.o. M) Treating RN: [name] Primary Care Keanan Melander: [name] Other Clinician: Referring Silas Sedam: [name] Treating Kaleb Linquist/Extender: [name] in Treatment: 9 Active Problems Location of Pain Severity and Description of Pain Patient Has Paino No Site Locations Pain Management and Medication Current Pain Management: Electronic Signature(s) Signed: 03/01/2018 3:49:29 PM By: [name] Entered By: [name] on 03/01/2018 09:32:06 Scarber, Carlos A. ([id number]) -------------------------------------------------------------------------------- Patient/Caregiver Education Details Patient Name: Lashway, Divante A. Date of Service: 03/01/2018 9:15 AM Medical Record Number: [id number] Patient Account Number: [id number] Date of Birth/Gender: 1970/09/19 (47 y.o. M) Treating RN: [name] Primary Care Physician: [name] Other Clinician: Referring Physician: Gaetano [name] Treating Physician/Extender: [name] in Treatment: 9 Education Assessment Education Provided To: Patient Education Topics Provided Venous: Handouts: Other: how to use new leg wrap for left leg Methods: Demonstration, Explain/Verbal Responses: State content correctly Electronic Signature(s) Signed: 03/01/2018 3:51:22 PM By: [name] Entered By: [name] on 03/01/2018 12:05:11 Rider, Brayon A. ([id number]) -------------------------------------------------------------------------------- Wound Assessment Details Patient Name: Dado, Jerel A. Date of Service: 03/01/2018 9:15 AM Medical Record  Number: [id number] Patient Account Number: [id number] Date of Birth/Sex: 06/24/1971 (47 y.o. M) Treating RN: [name] Primary Care Keiona Jenison: [name] Other Clinician: Referring Koray Soter: [name] Treating Abraham Margulies/Extender: [name] in Treatment: 9 Wound Status Wound Number: 2 Primary Etiology: Diabetic Wound/Ulcer of the Lower Extremity Wound Location: Left Lower Leg -  Lateral, Posterior Secondary Lymphedema Wounding Event: Gradually Appeared Etiology: Date Acquired: 09/30/2017 Wound Status: Open Weeks Of Treatment: 9 Comorbid History: Hypertension, Type II Diabetes, Clustered Wound: No Neuropathy Photos Photo Uploaded By: [name] on 03/01/2018 12:56:00 Wound Measurements Length: (cm) 2.5 Width: (cm) 6.5 Depth: (cm) 1 Area: (cm) 12.763 Volume: (cm) 12.763 % Reduction in Area: 54.9% % Reduction in Volume: -12.8% Epithelialization: None Tunneling: Yes Position (o'clock): 12 Maximum Distance: (cm) 1.2 Undermining: No Wound Description Classification: Grade 2 Wound Margin: Distinct, outline attached Exudate Amount: Large Exudate Type: Serosanguineous Exudate Color: red, brown Foul Odor After Cleansing: No Slough/Fibrino No Wound Bed Granulation Amount: Medium (34-66%) Exposed Structure Granulation Quality: Red Fascia Exposed: No Necrotic Amount: Medium (34-66%) Fat Layer (Subcutaneous Tissue) Exposed: No Necrotic Quality: Adherent Slough Tendon Exposed: No Muscle Exposed: No Joint Exposed: No Bone Exposed: No Romagnoli, Brently A. ([id number]) Periwound Skin Texture Texture Color No Abnormalities Noted: No No Abnormalities Noted: No Moisture Temperature / Pain No Abnormalities Noted: No Temperature: No Abnormality Maceration: Yes Tenderness on Palpation: Yes Wound Preparation Ulcer Cleansing: Rinsed/Irrigated with Saline Topical Anesthetic Applied: Other: lidocaine 4%, Treatment Notes Wound #2 (Left, Lateral,  Posterior Lower Leg) 1. Cleansed with: Clean wound with Normal Saline Cleanse wound with antibacterial soap and water 2. Anesthetic Topical Lidocaine 4% cream to wound bed prior to debridement 3. Peri-wound Care: Moisturizing lotion 4. Dressing Applied: Calcium Alginate with Silver 5. Secondary Dressing Applied ABD Pad 7. Secured with 4-Layer Compression System - Right Lower Extremity Notes silvercel Electronic Signature(s) Signed: 03/01/2018 3:49:29 PM By: [name] Signed: 03/01/2018 4:43:24 PM By: Gretta Cool, BSN, RN, CWS, Kim RN, BSN Entered By: Gretta Cool, BSN, RN, CWS, Kim on 03/01/2018 10:02:21 Stanger, [name] ([id number]) -------------------------------------------------------------------------------- Wound Assessment Details Patient Name: Sockwell, Fernand A. Date of Service: 03/01/2018 9:15 AM Medical Record Number: [id number] Patient Account Number: [id number] Date of Birth/Sex: [birth year] (47 y.o. M) Treating RN: [name] Primary Care Charistopher Rumble: [name] Other Clinician: Referring Kaede Clendenen: [name] Treating Hasel Janish/Extender: [name] in Treatment: 9 Wound Status Wound Number: 4 Primary Etiology: Lymphedema Wound Location: Right, Anterior Lower Leg Wound Status: Healed - Epithelialized Wounding Event: Gradually Appeared Date Acquired: 01/25/2018 Weeks Of Treatment: 5 Clustered Wound: No Photos Photo Uploaded By: [name] on 03/01/2018 12:56:00 Wound Measurements Length: (cm) 0 Width: (cm) 0 Depth: (cm) 0 Area: (cm) 0 Volume: (cm) 0 % Reduction in Area: 100% % Reduction in Volume: 100% Wound Description Full Thickness Without Exposed Support Classification: Structures Periwound Skin Texture Texture Color No Abnormalities Noted: No No Abnormalities Noted: No Moisture No Abnormalities Noted: No Electronic Signature(s) Signed: 03/01/2018 3:49:29 PM By: [name] Entered By: [name] on 03/01/2018  09:50:39 Albritton, Robin A. ([id number]) -------------------------------------------------------------------------------- Wound Assessment Details Patient Name: Gambrel, Kellyn A. Date of Service: 03/01/2018 9:15 AM Medical Record Number: [id number] Patient Account Number: [id number] Date of Birth/Sex: 02-27-1971 (47 y.o. M) Treating RN: [name] Primary Care Colter Magowan: [name] Other Clinician: Referring Natanel Snavely: [name] Treating Kiahna Banghart/Extender: [name] in Treatment: 9 Wound Status Wound Number: 5 Primary Etiology: Lymphedema Wound Location: Right, Medial Lower Leg Secondary Diabetic Wound/Ulcer of the Lower Etiology: Extremity Wounding Event: Blister Wound Status: Healed - Epithelialized Date Acquired: 02/01/2018 Weeks Of Treatment: 4 Clustered Wound: Yes Photos Photo Uploaded By: [name] on 03/01/2018 12:58:29 Wound Measurements Length: (cm) 0 Width: (cm) 0 Depth: (cm) 0 Area: (cm) 0 Volume: (cm) 0 % Reduction in Area: 100% % Reduction in Volume: 100% Wound Description Full Thickness Without  Exposed Support Classification: Structures Periwound Skin Texture Texture Color No Abnormalities Noted: No No Abnormalities Noted: No Moisture No Abnormalities Noted: No Electronic Signature(s) Signed: 03/01/2018 3:49:29 PM By: [name] Entered By: [name] on 03/01/2018 09:51:00 Cadenas, Deunte A. ([id number]) -------------------------------------------------------------------------------- Wound Assessment Details Patient Name: Orner, Camaron A. Date of Service: 03/01/2018 9:15 AM Medical Record Number: [id number] Patient Account Number: [id number] Date of Birth/Sex: [birth year] (47 y.o. M) Treating RN: [name] Primary Care Esperanza Madrazo: [name] Other Clinician: Referring Jarius Dieudonne: [name] Treating Shreeya Recendiz/Extender: [name] in Treatment: 9 Wound Status Wound Number: 6 Primary Etiology:  Lymphedema Wound Location: Right, Lateral Lower Leg Wound Status: Healed - Epithelialized Wounding Event: Gradually Appeared Date Acquired: 02/08/2018 Weeks Of Treatment: 3 Clustered Wound: No Photos Photo Uploaded By: [name] on 03/01/2018 12:58:29 Wound Measurements Length: (cm) 0 Width: (cm) 0 Depth: (cm) 0 Area: (cm) 0 Volume: (cm) 0 % Reduction in Area: 100% % Reduction in Volume: 100% Wound Description Full Thickness Without Exposed Support Classification: Structures Periwound Skin Texture Texture Color No Abnormalities Noted: No No Abnormalities Noted: No Moisture No Abnormalities Noted: No Electronic Signature(s) Signed: 03/01/2018 3:49:29 PM By: [name] Entered By: [name] on 03/01/2018 09:51:00 Acy, Watson A. ([id number]) -------------------------------------------------------------------------------- Wound Assessment Details Patient Name: Fetty, Domonic A. Date of Service: 03/01/2018 9:15 AM Medical Record Number: [id number] Patient Account Number: [id number] Date of Birth/Sex: 05/29/1971 (47 y.o. M) Treating RN: [name] Primary Care Jaemarie Hochberg: [name] Other Clinician: Referring Damia Bobrowski: [name] Treating Lona Six/Extender: [name] in Treatment: 9 Wound Status Wound Number: 7 Primary Etiology: Lymphedema Wound Location: Left Lower Leg - Medial, Proximal Wound Status: Open Wounding Event: Gradually Appeared Comorbid Hypertension, Type II Diabetes, History: Neuropathy Date Acquired: 02/15/2018 Weeks Of Treatment: 2 Clustered Wound: No Photos Photo Uploaded By: [name] on 03/01/2018 12:59:18 Wound Measurements Length: (cm) 0.7 Width: (cm) 1.2 Depth: (cm) 0.1 Area: (cm) 0.66 Volume: (cm) 0.066 % Reduction in Area: 23.6% % Reduction in Volume: 23.3% Epithelialization: None Tunneling: No Undermining: No Wound Description Full Thickness Without Exposed  Support Classification: Structures Wound Margin: Flat and Intact Exudate Large Amount: Exudate Type: Serosanguineous Exudate Color: red, brown Foul Odor After Cleansing: No Slough/Fibrino Yes Wound Bed Granulation Amount: Large (67-100%) Exposed Structure Granulation Quality: Red Fascia Exposed: No Necrotic Amount: Small (1-33%) Fat Layer (Subcutaneous Tissue) Exposed: Yes Necrotic Quality: Eschar Tendon Exposed: No Muscle Exposed: No Joint Exposed: No Bone Exposed: No Periwound Skin Texture Texture Color Lobosco, Ahmere A. ([id number]) No Abnormalities Noted: No No Abnormalities Noted: No Callus: No Atrophie Blanche: No Crepitus: No Cyanosis: No Excoriation: No Ecchymosis: No Induration: No Erythema: No Rash: No Hemosiderin Staining: No Scarring: No Mottled: No Pallor: No Moisture Rubor: No No Abnormalities Noted: No Dry / Scaly: No Temperature / Pain Maceration: No Temperature: No Abnormality Tenderness on Palpation: Yes Wound Preparation Ulcer Cleansing: Rinsed/Irrigated with Saline Topical Anesthetic Applied: Other: lidocaine 4%, Treatment Notes Wound #7 (Left, Proximal, Medial Lower Leg) 1. Cleansed with: Clean wound with Normal Saline Cleanse wound with antibacterial soap and water 2. Anesthetic Topical Lidocaine 4% cream to wound bed prior to debridement 3. Peri-wound Care: Moisturizing lotion 4. Dressing Applied: Calcium Alginate with Silver 5. Secondary Dressing Applied ABD Pad 7. Secured with 4-Layer Compression System - Right Lower Extremity Notes silvercel Electronic Signature(s) Signed: 03/01/2018 3:49:29 PM By: [name] Entered By: [name] on 03/01/2018 09:48:07 Madore, Conley A. ([id number]) -------------------------------------------------------------------------------- Wound Assessment Details Patient Name: Fetty, Tiandre A. Date of Service: 03/01/2018  9:15 AM Medical Record Number: [id number] Patient Account Number:  [id number] Date of Birth/Sex: August 07, 1971 (47 y.o. M) Treating RN: [name] Primary Care Damarius Karnes: [name] Other Clinician: Referring Celica Kotowski: [name] Treating Nadelyn Enriques/Extender: [name] in Treatment: 9 Wound Status Wound Number: 8 Primary Etiology: Lymphedema Wound Location: Left Lower Leg - Posterior, Proximal Wound Status: Open Wounding Event: Gradually Appeared Comorbid Hypertension, Type II Diabetes, History: Neuropathy Date Acquired: 02/22/2018 Weeks Of Treatment: 1 Clustered Wound: Yes Photos Photo Uploaded By: [name] on 03/01/2018 12:59:18 Wound Measurements Length: (cm) 1.9 Width: (cm) 0.5 Depth: (cm) 0.1 Clustered Quantity: 3 Area: (cm) 0.746 Volume: (cm) 0.075 % Reduction in Area: 97% % Reduction in Volume: 97% Epithelialization: None Tunneling: No Undermining: No Wound Description Full Thickness Without Exposed Support Classification: Structures Wound Margin: Distinct, outline attached Exudate Large Amount: Exudate Type: Serosanguineous Exudate Color: red, brown Foul Odor After Cleansing: No Slough/Fibrino Yes Wound Bed Granulation Amount: Medium (34-66%) Exposed Structure Granulation Quality: Red Fascia Exposed: No Necrotic Amount: Medium (34-66%) Fat Layer (Subcutaneous Tissue) Exposed: Yes Necrotic Quality: Eschar, Adherent Slough Tendon Exposed: No Muscle Exposed: No Joint Exposed: No Bone Exposed: No Periwound Skin Texture Berkheimer, Rashied A. ([id number]) Texture Color No Abnormalities Noted: No No Abnormalities Noted: No Callus: No Atrophie Blanche: No Crepitus: No Cyanosis: No Excoriation: No Ecchymosis: No Induration: No Erythema: No Rash: No Hemosiderin Staining: No Scarring: No Mottled: No Pallor: No Moisture Rubor: No No Abnormalities Noted: No Dry / Scaly: Yes Maceration: No Wound Preparation Ulcer Cleansing: Rinsed/Irrigated with Saline Topical Anesthetic Applied: Other:  lidocaine 4%, Treatment Notes Wound #8 (Left, Proximal, Posterior Lower Leg) 1. Cleansed with: Clean wound with Normal Saline Cleanse wound with antibacterial soap and water 2. Anesthetic Topical Lidocaine 4% cream to wound bed prior to debridement 3. Peri-wound Care: Moisturizing lotion 4. Dressing Applied: Calcium Alginate with Silver 5. Secondary Dressing Applied ABD Pad 7. Secured with 4-Layer Compression System - Right Lower Extremity Notes [employer] Electronic Signature(s) Signed: 03/01/2018 3:49:29 PM By: [name] Entered By: [name] on 03/01/2018 09:51:59 Mccants, Mitsugi A. ([id number]) -------------------------------------------------------------------------------- Vitals Details Patient Name: Friedel, Tyrece A. Date of Service: 03/01/2018 9:15 AM Medical Record Number: [id number] Patient Account Number: [id number] Date of Birth/Sex: 1971-02-14 (47 y.o. M) Treating RN: [name] Primary Care Kadee Philyaw: [name] Other Clinician: Referring Eesha Schmaltz: [name] Treating Cameka Rae/Extender: [name] in Treatment: 9 Vital Signs Time Taken: 09:34 Temperature (F): 98.1 Height (in): 76 Pulse (bpm): 57 Weight (lbs): 379.5 Respiratory Rate (breaths/min): 18 Body Mass Index (BMI): 46.2 Blood Pressure (mmHg): 185/87 Reference Range: 80 - 120 mg / dl Notes Made Dr. Dellia Nims aware of BP. Electronic Signature(s) Signed: 03/01/2018 3:49:29 PM By: [name] Entered By: [name] on 03/01/2018 09:35:55

## 2018-03-03 NOTE — Progress Notes (Signed)
MARX, DOIG ([id number]) Visit Report for 03/01/2018 HPI Details Patient Name: [name] [name] Date of Service: 03/01/2018 9:15 AM Medical Record Number: [id number] Patient Account Number: [id number] Date of Birth/Sex: December 02, 1970 (47 y.o. M) Treating RN: Cornell Barman Primary Care Provider: Gaetano Net Other Clinician: Referring Provider: Gaetano Net Treating Provider/Extender: Tito Dine in Treatment: 9 History of Present Illness HPI Description: 12/28/17; this is [name] now 47 year old man who is [name] type II diabetic. He was hospitalized from 10/01/17 through 10/19/17. He had an MSSA soft tissue and skin infection. 2 open areas on the left leg were identified he has [name] smaller area on the left medial calf superiorly just below the knee and [name] wound just above the left ankle on the posterior medial aspect. I think both of these were surgical IandD sites when he was in the hospital. He was discharged with [name] wound VAC at that point however this is since been taken off. He follows with Dr. Ola Spurr for the [facility] and he is still on chronic Keflex at 500 twice [name] day. At that time he was hospitalized his hemoglobin A1c was 15.1 however if I'm reading his endocrinologist notes correctly that is improved. He has been following with Dr. [name] at vein [facility] vascular and he has been applying calcium alginate and Unna boots. He has home health changing the dressing. They have also been attempting to get him external compression pumps although the patient is unaware whether they've been approved by insurance at this point. as mentioned he has [name] smaller clean wound on the right lateral calf just below the knee and he has [name] much larger area just above the left ankle medially and posteriorly. Our intake nurse reported greenish purulent looking drainage.the patient did have surgical material sent to pathology in February. This showed chronic abscess The patient also has lymphedema stage III in the left  greater than right lower extremities. He has [name] history of blisters with wounds but these of all were always healed. The patient thinks that the lymphedema may have been present since he was about 47 years old i.e. about 30 years. He does not have graded pressure stockings and has not worn stockings. He does not have [name] distant history of DVT PE or phlebitis. He has not been systemically unwell fever no chills. He states that his Lasix is recently been reduced. He tells me his kidney function is at "30%" and he has been followed by Dr. [name] of nephrology. At one point he was on Lasix 80 twice [name] day however that's been cut back and he is now on Lasix at 20 twice [name] day. The patient has [name] history of PAD listed in his records although he comes from Dr. [name] I don't think is felt to have significant PAD. ABIs in our clinic were noncompressible bilaterally. 01/04/18; patient has [name] large wound on the left lateral lower calf and [name] small wound on the left medial upper calf. He has been to see his nephrologist who changed him to Demadex 40 mg [name] day. I'm hopeful this will help with his systemic fluid overload. He also has stage III lymphedema. Really no change in the 2 wounds since last week 01/11/18; the patient is down 13 pounds. He put his stage III lymphedema left leg in 4K compression last week and there is less edema fluid however we still haven't been able to communicate with home health but [facility] but the dressings have not been changed. The  patient noted an odor last week. He is also had compression pumps ordered by [place] [facility] this as [name] not completed the paperwork stage. 01/18/18; patient continues to lose weight. Stage III lymphedema in the left greater than right leg under for alert compression. The major wound is on the left lateral ankle area. He apparently has bilateral compression pumps being brought to his house, these were ordered by  vein and  vascular Notable for the fact today he had some blisters on the right anterior leg together with some skin nodules. This is no doubt secondary to severe lymphedema. 01/25/18; the patient has obtained his compression pumps and is using them per vein and vascular instructions 3 times [name] day for an hour. He also saw [name] of vein and vascular. He was felt to have venous insufficiency but did not suggest any intervention also improved edema. It was suggested that he have compression stockings 20-30 mm on [name] daily basis in addition to compression pumps. [name] [name] ([id number]) The patient arrives in clinic today with [name] layer of unna under for layer compression. he seems to have some trouble with the degree of compression. He has open areas on the left lateral ankle area which is his major wound left upper medial calf and [name] superficial open area on the right anterior shin area which was blistered last week. He has skin changes on the right anterior calf which I think are no doubt secondary to lymphedema skin nodules etc. 02/01/18; the patient comes in telling us his nephrologist have to his torsemide. Unfortunately today he is put on 7 pounds by our scales. He has blisters all over the anterior and medial part of his right calf and [name] new open wound. He also has soupy green drainage coming out of the left lateral calf /ankle wound. 02/08/18; culture I did last week of the left lateral ankle wound grew both Pseudomonas and Morganella. He is on Keflex from Dr. Ola Spurr in the hospital. I will need to review these notes.in any case Keflex is not going to cover these 2 organisms. I'm probably going to added ciprofloxacin today for 1 week. [name] lot of drainage that looks purulent last week. He is not complaining of pain however he has managed to put on 10 pounds in 2 weeks by our scales in this clinic. He is going to see his nephrologist tomorrow 02/15/18; he completed the ciprofloxacin I gave him last week.  Notable that he is up to 379 pounds today which is up 16 pounds from 2 weeks ago. He is complaining of orthopnea but doesn't have any chest pain. 02/22/18; he continues to have weight gain. R intake nurse reports again purulent green drainage coming out of the lateral wound on the lateral left calf. He has small open area on the right anterior leg. His torsemide was increased to 2 tablets [name] day I believe this is 40 mg last week in response to the call admitted to Dr. Keturah Barre office. He follows up with Dr. [name] and Dr. Delana Meyer tomorrow 03/01/18 his weight essentially stable today at 383 pounds. Drainage out of the left lateral wound on the lower left calf/ankle is [name] lot less. Culture last time grew Pseudomonas. I put him on cefdinir. He has been to Dr. Keturah Barre office no adjustments in his diuretics. Dr. Nicoletta Dress prescribed [name] wraparound stocking for the right leg.there is no open area on the right leg. He has [name] superficial area on the left medial calf, left posterior  calf and in the large area on the left lateral however this looks better Electronic Signature(s) Signed: 03/03/2018 8:34:07 AM By: Linton Ham MD Entered By: Linton Ham on 03/01/2018 10:52:10 [name] [name] ([id number]) -------------------------------------------------------------------------------- Physical Exam Details Patient Name: Aaronson, Amitai [name] Date of Service: 03/01/2018 9:15 AM Medical Record Number: [id number] Patient Account Number: [id number] Date of Birth/Sex: 1971/07/04 (47 y.o. M) Treating RN: Cornell Barman Primary Care Provider: Gaetano Net Other Clinician: Referring Provider: Gaetano Net Treating Provider/Extender: Tito Dine in Treatment: 9 Constitutional Patient is hypertensive.. Pulse regular and within target range for patient.[name] Respirations regular, non-labored and within target range.. Temperature is normal and within the target range for the patient.[name] appears in no  distress. Eyes Conjunctivae clear. No discharge. Cardiovascular Pedal pulses palpable and strong bilaterally.[name] left greater than right lymphedema. Lymphatic none palpable in the popliteal or inguinal area. Psychiatric Patient appears depressed today.. Notes wound exam oThere is no open wound on the right calf he can go into his compression stockings today oOn the left small medial wound and small posterior calf wound. Both of these look better. The large wound is on the left lower lateral calf just in the ankle area. This looks better. There is some epithelialization. Still some overhanging medially. Less drainage Electronic Signature(s) Signed: 03/03/2018 8:34:07 AM By: Linton Ham MD Entered By: Linton Ham on 03/01/2018 10:53:50 [name] [name] AMarland Kitchen ([id number]) -------------------------------------------------------------------------------- Physician Orders Details Patient Name: [name] [name] Date of Service: 03/01/2018 9:15 AM Medical Record Number: [id number] Patient Account Number: [id number] Date of Birth/Sex: 25-Jan-1971 (47 y.o. M) Treating RN: Cornell Barman Primary Care Provider: Gaetano Net Other Clinician: Referring Provider: Gaetano Net Treating Provider/Extender: Tito Dine in Treatment: 9 Verbal / Phone Orders: No Diagnosis Coding Anesthetic (add to Medication List) Wound #2 Left,Lateral,Posterior Lower Leg o Topical Lidocaine 4% cream applied to wound bed prior to debridement (In Clinic Only). Wound #7 Left,Proximal,Medial Lower Leg o Topical Lidocaine 4% cream applied to wound bed prior to debridement (In Clinic Only). Wound #8 Left,Proximal,Posterior Lower Leg o Topical Lidocaine 4% cream applied to wound bed prior to debridement (In Clinic Only). Skin Barriers/Peri-Wound Care Wound #2 Left,Lateral,Posterior Lower Leg o Barrier cream Wound #7 Left,Proximal,Medial Lower Leg o Barrier cream Wound #8 Left,Proximal,Posterior Lower  Leg o Barrier cream Primary Wound Dressing Wound #2 Left,Lateral,Posterior Lower Leg o Silver Alginate Wound #7 Left,Proximal,Medial Lower Leg o Silver Alginate Wound #8 Left,Proximal,Posterior Lower Leg o Silver Alginate Secondary Dressing Wound #2 Left,Lateral,Posterior Lower Leg o ABD pad o XtraSorb Wound #7 Left,Proximal,Medial Lower Leg o ABD pad o XtraSorb Wound #8 Left,Proximal,Posterior Lower Leg o ABD pad o XtraSorb Dressing Change Frequency Colla, Anan [name] ([id number]) Wound #2 Left,Lateral,Posterior Lower Leg o Change Dressing Monday, Wednesday, Friday - Wednesday in clinic. Wound #7 Left,Proximal,Medial Lower Leg o Change Dressing Monday, Wednesday, Friday - Wednesday in clinic. Wound #8 Left,Proximal,Posterior Lower Leg o Change Dressing Monday, Wednesday, Friday - Wednesday in clinic. Follow-up Appointments Wound #2 Left,Lateral,Posterior Lower Leg o Return Appointment in 1 week. Wound #7 Left,Proximal,Medial Lower Leg o Return Appointment in 1 week. Wound #8 Left,Proximal,Posterior Lower Leg o Return Appointment in 1 week. Edema Control Wound #2 Left,Lateral,Posterior Lower Leg o 4-Layer Compression System - Left Lower Extremity. o Patient to wear own compression stockings - Right Leg o Elevate legs to the level of the heart and pump ankles as often as possible o Compression Pump: Use compression pump on left lower extremity for 30 minutes, twice daily. - 1 hour twice  daily o Compression Pump: Use compression pump on right lower extremity for 30 minutes, twice daily. - 1 hour twice daily Wound #7 Left,Proximal,Medial Lower Leg o 4-Layer Compression System - Left Lower Extremity. o Patient to wear own compression stockings - Right Leg o Elevate legs to the level of the heart and pump ankles as often as possible o Compression Pump: Use compression pump on left lower extremity for 30 minutes, twice daily. - 1  hour twice daily o Compression Pump: Use compression pump on right lower extremity for 30 minutes, twice daily. - 1 hour twice daily Wound #8 Left,Proximal,Posterior Lower Leg o 4-Layer Compression System - Left Lower Extremity. o Patient to wear own compression stockings - Right Leg o Elevate legs to the level of the heart and pump ankles as often as possible o Compression Pump: Use compression pump on left lower extremity for 30 minutes, twice daily. - 1 hour twice daily o Compression Pump: Use compression pump on right lower extremity for 30 minutes, twice daily. - 1 hour twice daily Home Health Wound #2 [place] Visits - Monday and Friday o Home Health Nurse may visit PRN to address patientos wound care needs. o FACE TO FACE ENCOUNTER: MEDICARE and MEDICAID PATIENTS: I certify that this patient is under my care and that I had [name] face-to-face encounter that meets the physician face-to-face encounter requirements with this patient on this date. The encounter with the patient was in whole or in part for the following MEDICAL CONDITION: (primary reason for [place]) MEDICAL NECESSITY: I certify, that based on my findings, NURSING services are [name] medically necessary home health service. HOME BOUND STATUS: I certify that my Hosick, Allyn [name] ([id number]) clinical findings support that this patient is homebound (i.e., Due to illness or injury, pt requires aid of supportive devices such as crutches, cane, wheelchairs, walkers, the use of special transportation or the assistance of another person to leave their place of residence. There is [name] normal inability to leave the home and doing so requires considerable and taxing effort. Other absences are for medical reasons / religious services and are infrequent or of short duration when for other reasons). o If current dressing causes regression in wound condition, may D/C ordered  dressing product/s and apply Normal Saline Moist Dressing daily until next [place] / Other MD appointment. [place] of regression in wound condition at [phone]. Wound #7 Left,Proximal,Medial Lower Leg o Continue Home Health Visits - Monday and Friday o Home Health Nurse may visit PRN to address patientos wound care needs. o FACE TO FACE ENCOUNTER: MEDICARE and MEDICAID PATIENTS: I certify that this patient is under my care and that I had [name] face-to-face encounter that meets the physician face-to-face encounter requirements with this patient on this date. The encounter with the patient was in whole or in part for the following MEDICAL CONDITION: (primary reason for [place]) MEDICAL NECESSITY: I certify, that based on my findings, NURSING services are [name] medically necessary home health service. HOME BOUND STATUS: I certify that my clinical findings support that this patient is homebound (i.e., Due to illness or injury, pt requires aid of supportive devices such as crutches, cane, wheelchairs, walkers, the use of special transportation or the assistance of another person to leave their place of residence. There is [name] normal inability to leave the home and doing so requires considerable and taxing effort. Other absences are for medical reasons / religious services and  are infrequent or of short duration when for other reasons). o If current dressing causes regression in wound condition, may D/C ordered dressing product/s and apply Normal Saline Moist Dressing daily until next [place] / Other MD appointment. [place] of regression in wound condition at [phone]. Wound #8 Left,Proximal,Posterior Lower Leg o [place] Visits - Monday and Friday o Home Health Nurse may visit PRN to address patientos wound care needs. o FACE TO FACE ENCOUNTER: MEDICARE and MEDICAID PATIENTS: I certify that this patient  is under my care and that I had [name] face-to-face encounter that meets the physician face-to-face encounter requirements with this patient on this date. The encounter with the patient was in whole or in part for the following MEDICAL CONDITION: (primary reason for [place]) MEDICAL NECESSITY: I certify, that based on my findings, NURSING services are [name] medically necessary home health service. HOME BOUND STATUS: I certify that my clinical findings support that this patient is homebound (i.e., Due to illness or injury, pt requires aid of supportive devices such as crutches, cane, wheelchairs, walkers, the use of special transportation or the assistance of another person to leave their place of residence. There is [name] normal inability to leave the home and doing so requires considerable and taxing effort. Other absences are for medical reasons / religious services and are infrequent or of short duration when for other reasons). o If current dressing causes regression in wound condition, may D/C ordered dressing product/s and apply Normal Saline Moist Dressing daily until next [place] / Other MD appointment. [place] of regression in wound condition at [phone]. Electronic Signature(s) Signed: 03/01/2018 4:43:24 PM By: Gretta Cool, BSN, RN, CWS, Kim RN, BSN Signed: 03/03/2018 8:34:07 AM By: Linton Ham MD Entered By: Gretta Cool, BSN, RN, CWS, Kim on 03/01/2018 10:05:37 [name] [name] ([id number]) -------------------------------------------------------------------------------- Problem List Details Patient Name: [name] [name] Date of Service: 03/01/2018 9:15 AM Medical Record Number: [id number] Patient Account Number: [id number] Date of Birth/Sex: [birth year] (47 y.o. 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 We Escribed continue silver Decision and oral third-generation cephalosporin cefdinir 300 twice [name] alternate complete Making : day for 7 days antibiotics I89.0 Lymphedema, not elsewhere classified 12/28/2017 Yes Yes Status Complications Interventions No weight today is essentially stable at 383 pounds. we are using 4-layer change compression with rolled ABDs over the Medical wound area. He also Decision has compression Making : pumps he is using these twice [name] day at home. W10.932 Chronic venous hypertension (idiopathic) with ulcer and 12/28/2017 No Yes inflammation of left lower extremity E11.622 Type 2 diabetes mellitus with other skin ulcer 12/28/2017 No Yes L97.211 Non-pressure chronic ulcer of right calf limited to breakdown 02/22/2018 Yes Yes of skin Status Complications Interventions Improving there is no open area on this leg. patient has [name] wraparound Medical compression Decision stocking prescribed Making : by Dr. Alessandra Grout were very to put this on today ARLEE, [place] ([id number]) Inactive Problems Resolved Problems Electronic Signature(s) Signed: 03/03/2018 8:34:07 AM By: Linton Ham MD Entered By: Linton Ham on 03/01/2018 10:49:48 [name] [name] ([id number]) -------------------------------------------------------------------------------- Progress Note Details Patient Name: [name] [name] Date  of Service: 03/01/2018 9:15 AM Medical Record Number: [id number] Patient Account Number: [id number] Date of Birth/Sex: [birth year] (47 y.o. M) Treating RN: Cornell Barman Primary Care Provider: Gaetano Net Other Clinician: Referring Provider: Gaetano Net Treating Provider/Extender: Tito Dine  in Treatment: 9 Subjective History of Present Illness (HPI) 12/28/17; this is [name] now 47 year old man who is [name] type II diabetic. He was hospitalized from 10/01/17 through 10/19/17. He had an MSSA soft tissue and skin infection. 2 open areas on the left leg were identified he has [name] smaller area on the left medial calf superiorly just below the knee and [name] wound just above the left ankle on the posterior medial aspect. I think both of these were surgical IandD sites when he was in the hospital. He was discharged with [name] wound VAC at that point however this is since been taken off. He follows with Dr. Ola Spurr for the [facility] and he is still on chronic Keflex at 500 twice [name] day. At that time he was hospitalized his hemoglobin A1c was 15.1 however if I'm reading his endocrinologist notes correctly that is improved. He has been following with Dr. [name] at vein and vascular and he has been applying calcium alginate and Unna boots. He has home health changing the dressing. They have also been attempting to get him external compression pumps although the patient is unaware whether they've been approved by insurance at this point. as mentioned he has [name] smaller clean wound on the right lateral calf just below the knee and he has [name] much larger area just above the left ankle medially and posteriorly. Our intake nurse reported greenish purulent looking drainage.the patient did have surgical material sent to pathology in February. This showed chronic abscess The patient also has lymphedema stage III in the left greater than right lower extremities. He has [name] history of blisters with wounds but these of all were always healed. The patient thinks that the lymphedema may have been present since he was about 47 years old i.e. about 30 years. He does not have graded pressure stockings and has not worn stockings. He does not have [name] distant history of DVT PE or phlebitis. He has not been systemically unwell fever no chills. He  states that his Lasix is recently been reduced. He tells me his kidney function is at "30%" and he has been followed by Dr. [name] of nephrology. At one point he was on Lasix 80 twice [name] day however that's been cut back and he is now on Lasix at 20 twice [name] day. The patient has [name] history of PAD listed in his records although he comes from Dr. [name] I don't think is felt to have significant PAD. ABIs in our clinic were noncompressible bilaterally. 01/04/18; patient has [name] large wound on the left lateral lower calf and [name] small wound on the left medial upper calf. He has been to see his nephrologist who changed him to Demadex 40 mg [name] day. I'm hopeful this will help with his systemic fluid overload. He also has stage III lymphedema. Really no change in the 2 wounds since last week 01/11/18; the patient is down 13 pounds. He put his stage III lymphedema left leg in 4K compression last week and there is less edema fluid however we still haven't been able to communicate with home health but [facility] but the dressings have not been changed. The patient noted an odor last week. He is also had compression pumps ordered by [place]  [facility] this as [name] not completed the paperwork stage. 01/18/18; patient continues to lose weight. Stage III lymphedema in the left greater than right leg under for alert compression. The major wound is on the left lateral ankle area. He apparently has bilateral compression pumps being brought to his house, these were ordered by [facility] [facility] Notable for the fact today he had some blisters on the right anterior leg together with some skin nodules. This is no doubt secondary to severe lymphedema. 01/25/18; the patient has obtained his compression pumps and is using them per vein and vascular instructions 3 times [name] day for an hour. He also saw [name] of vein and vascular. He was felt to have venous insufficiency but did not suggest any intervention  also improved edema. It was suggested that he have compression stockings 20-30 mm on [name] daily basis in addition to compression pumps. The patient arrives in clinic today with [name] layer of unna under for layer compression. he seems to have some trouble with the degree of compression. He has open areas on the left lateral ankle area which is his major wound left upper medial calf and [name] [name] [name] ([id number]) superficial open area on the right anterior shin area which was blistered last week. He has skin changes on the right anterior calf which I think are no doubt secondary to lymphedema skin nodules etc. 02/01/18; the patient comes in telling us his nephrologist have to his torsemide. Unfortunately today he is put on 7 pounds by our scales. He has blisters all over the anterior and medial part of his right calf and [name] new open wound. He also has soupy green drainage coming out of the left lateral calf /ankle wound. 02/08/18; culture I did last week of the left lateral ankle wound grew both Pseudomonas and Morganella. He is on Keflex from Dr. Ola Spurr in the hospital. I will need to review these notes.in any case Keflex is not going to cover these 2 organisms. I'm probably going to added ciprofloxacin today for 1 week. [name] lot of drainage that looks purulent last week. He is not complaining of pain however he has managed to put on 10 pounds in 2 weeks by our scales in this clinic. He is going to see his nephrologist tomorrow 02/15/18; he completed the ciprofloxacin I gave him last week. Notable that he is up to 379 pounds today which is up 16 pounds from 2 weeks ago. He is complaining of orthopnea but doesn't have any chest pain. 02/22/18; he continues to have weight gain. R intake nurse reports again purulent green drainage coming out of the lateral wound on the lateral left calf. He has small open area on the right anterior leg. His torsemide was increased to 2 tablets [name] day I believe this is 40 mg  last week in response to the call admitted to Dr. Keturah Barre office. He follows up with Dr. [name] and Dr. Delana Meyer tomorrow 03/01/18 his weight essentially stable today at 383 pounds. Drainage out of the left lateral wound on the lower left calf/ankle is [name] lot less. Culture last time grew Pseudomonas. I put him on cefdinir. He has been to Dr. Keturah Barre office no adjustments in his diuretics. Dr. Nicoletta Dress prescribed [name] wraparound stocking for the right leg.there is no open area on the right leg. He has [name] superficial area on the left medial calf, left posterior calf and in the large area on the left lateral however this looks better Objective  Constitutional Patient is hypertensive.. Pulse regular and within target range for patient.[name] Respirations regular, non-labored and within target range.. Temperature is normal and within the target range for the patient.[name] appears in no distress. Vitals Time Taken: 9:34 AM, Height: 76 in, Weight: 379.5 lbs, BMI: 46.2, Temperature: 98.1 F, Pulse: 57 bpm, Respiratory Rate: 18 breaths/min, Blood Pressure: 185/87 mmHg. General Notes: Made Dr. Dellia Nims aware of BP. Eyes Conjunctivae clear. No discharge. Cardiovascular Pedal pulses palpable and strong bilaterally.[name] left greater than right lymphedema. Lymphatic none palpable in the popliteal or inguinal area. Psychiatric Patient appears depressed today.. General Notes: wound exam There is no open wound on the right calf he can go into his compression stockings today On the left small medial wound and small posterior calf wound. Both of these look better. The large wound is on the left lower lateral calf just in the ankle area. This looks better. There is some epithelialization. Still some overhanging medially. Less drainage Integumentary (Hair, Skin) [name] [name] ([id number]) Wound #2 status is Open. Original cause of wound was Gradually Appeared. The wound is located on the Left,Lateral,Posterior Lower Leg. The wound  measures 2.5cm length x 6.5cm width x 1cm depth; 12.763cm^2 area and 12.763cm^3 volume. There is no undermining noted, however, there is tunneling at 12:00 with [name] maximum distance of 1.2cm. There is [name] large amount of serosanguineous drainage noted. The wound margin is distinct with the outline attached to the wound base. There is medium (34-66%) red granulation within the wound bed. There is [name] medium (34-66%) amount of necrotic tissue within the wound bed including Adherent Slough. The periwound skin appearance exhibited: Maceration. Periwound temperature was noted as No Abnormality. The periwound has tenderness on palpation. Wound #4 status is Healed - Epithelialized. Original cause of wound was Gradually Appeared. The wound is located on the Right,Anterior Lower Leg. The wound measures 0cm length x 0cm width x 0cm depth; 0cm^2 area and 0cm^3 volume. Wound #5 status is Healed - Epithelialized. Original cause of wound was Blister. The wound is located on the Right,Medial Lower Leg. The wound measures 0cm length x 0cm width x 0cm depth; 0cm^2 area and 0cm^3 volume. Wound #6 status is Healed - Epithelialized. Original cause of wound was Gradually Appeared. The wound is located on the Right,Lateral Lower Leg. The wound measures 0cm length x 0cm width x 0cm depth; 0cm^2 area and 0cm^3 volume. Wound #7 status is Open. Original cause of wound was Gradually Appeared. The wound is located on the Left,Proximal,Medial Lower Leg. The wound measures 0.7cm length x 1.2cm width x 0.1cm depth; 0.66cm^2 area and 0.066cm^3 volume. There is Fat Layer (Subcutaneous Tissue) Exposed exposed. There is no tunneling or undermining noted. There is [name] large amount of serosanguineous drainage noted. The wound margin is flat and intact. There is large (67- 100%) red granulation within the wound bed. There is [name] small (1-33%) amount of necrotic tissue within the wound bed including Eschar. The periwound skin appearance did not  exhibit: Callus, Crepitus, Excoriation, Induration, Rash, Scarring, Dry/Scaly, Maceration, Atrophie Blanche, Cyanosis, Ecchymosis, Hemosiderin Staining, Mottled, Pallor, Rubor, Erythema. Periwound temperature was noted as No Abnormality. The periwound has tenderness on palpation. Wound #8 status is Open. Original cause of wound was Gradually Appeared. The wound is located on the Left,Proximal,Posterior Lower Leg. The wound measures 1.9cm length x 0.5cm width x 0.1cm depth; 0.746cm^2 area and 0.075cm^3 volume. There is Fat Layer (Subcutaneous Tissue) Exposed exposed. There is no tunneling or undermining noted. There is [name] large amount  of serosanguineous drainage noted. The wound margin is distinct with the outline attached to the wound base. There is medium (34-66%) red granulation within the wound bed. There is [name] medium (34-66%) amount of necrotic tissue within the wound bed including Eschar and Adherent Slough. The periwound skin appearance exhibited: Dry/Scaly. The periwound skin appearance did not exhibit: Callus, Crepitus, Excoriation, Induration, Rash, Scarring, Maceration, Atrophie Blanche, Cyanosis, Ecchymosis, Hemosiderin Staining, Mottled, Pallor, Rubor, Erythema. Assessment Active Problems ICD-10 Non-pressure chronic ulcer of left calf with necrosis of muscle Lymphedema, not elsewhere classified Chronic venous hypertension (idiopathic) with ulcer and inflammation of left lower extremity Type 2 diabetes mellitus with other skin ulcer Non-pressure chronic ulcer of right calf limited to breakdown of skin Plan Anesthetic (add to Medication List): Wound #2 Left,Lateral,Posterior Lower Leg: [name] [name] ([id number]) Topical Lidocaine 4% cream applied to wound bed prior to debridement (In Clinic Only). Wound #7 Left,Proximal,Medial Lower Leg: Topical Lidocaine 4% cream applied to wound bed prior to debridement (In Clinic Only). Wound #8 Left,Proximal,Posterior Lower Leg: Topical  Lidocaine 4% cream applied to wound bed prior to debridement (In Clinic Only). Skin Barriers/Peri-Wound Care: Wound #2 Left,Lateral,Posterior Lower Leg: Barrier cream Wound #7 Left,Proximal,Medial Lower Leg: Barrier cream Wound #8 Left,Proximal,Posterior Lower Leg: Barrier cream Primary Wound Dressing: Wound #2 Left,Lateral,Posterior Lower Leg: Silver Alginate Wound #7 Left,Proximal,Medial Lower Leg: Silver Alginate Wound #8 Left,Proximal,Posterior Lower Leg: Silver Alginate Secondary Dressing: Wound #2 Left,Lateral,Posterior Lower Leg: ABD pad XtraSorb Wound #7 Left,Proximal,Medial Lower Leg: ABD pad XtraSorb Wound #8 Left,Proximal,Posterior Lower Leg: ABD pad XtraSorb Dressing Change Frequency: Wound #2 Left,Lateral,Posterior Lower Leg: Change Dressing Monday, Wednesday, Friday - Wednesday in clinic. Wound #7 Left,Proximal,Medial Lower Leg: Change Dressing Monday, Wednesday, Friday - Wednesday in clinic. Wound #8 Left,Proximal,Posterior Lower Leg: Change Dressing Monday, Wednesday, Friday - Wednesday in clinic. Follow-up Appointments: Wound #2 Left,Lateral,Posterior Lower Leg: Return Appointment in 1 week. Wound #7 Left,Proximal,Medial Lower Leg: Return Appointment in 1 week. Wound #8 Left,Proximal,Posterior Lower Leg: Return Appointment in 1 week. Edema Control: Wound #2 Left,Lateral,Posterior Lower Leg: 4-Layer Compression System - Left Lower Extremity. Patient to wear own compression stockings - Right Leg Elevate legs to the level of the heart and pump ankles as often as possible Compression Pump: Use compression pump on left lower extremity for 30 minutes, twice daily. - 1 hour twice daily Compression Pump: Use compression pump on right lower extremity for 30 minutes, twice daily. - 1 hour twice daily Wound #7 Left,Proximal,Medial Lower Leg: 4-Layer Compression System - Left Lower Extremity. Patient to wear own compression stockings - Right Leg Elevate legs to  the level of the heart and pump ankles as often as possible Compression Pump: Use compression pump on left lower extremity for 30 minutes, twice daily. - 1 hour twice daily Compression Pump: Use compression pump on right lower extremity for 30 minutes, twice daily. - 1 hour twice daily Wound #8 Left,Proximal,Posterior Lower Leg: 4-Layer Compression System - Left Lower Extremity. Patient to wear own compression stockings - Right Leg Elevate legs to the level of the heart and pump ankles as often as possible Okerlund, Ramces [name] ([id number]) Compression Pump: Use compression pump on left lower extremity for 30 minutes, twice daily. - 1 hour twice daily Compression Pump: Use compression pump on right lower extremity for 30 minutes, twice daily. - 1 hour twice daily Home Health: Wound #2 Left,Lateral,Posterior Lower Leg: Continue Home Health Visits - Monday and Friday Home Health Nurse may visit PRN to address patient s wound care needs.  FACE TO FACE ENCOUNTER: MEDICARE and MEDICAID PATIENTS: I certify that this patient is under my care and that I had [name] face-to-face encounter that meets the physician face-to-face encounter requirements with this patient on this date. The encounter with the patient was in whole or in part for the following MEDICAL CONDITION: (primary reason for [place]) MEDICAL NECESSITY: I certify, that based on my findings, NURSING services are [name] medically necessary home health service. HOME BOUND STATUS: I certify that my clinical findings support that this patient is homebound (i.e., Due to illness or injury, pt requires aid of supportive devices such as crutches, cane, wheelchairs, walkers, the use of special transportation or the assistance of another person to leave their place of residence. There is [name] normal inability to leave the home and doing so requires considerable and taxing effort. Other absences are for medical reasons / religious services and are infrequent or of  short duration when for other reasons). If current dressing causes regression in wound condition, may D/C ordered dressing product/s and apply Normal Saline Moist Dressing daily until next [place] / Other MD appointment. [place] of regression in wound condition at [phone]. Wound #7 Left,Proximal,Medial Lower Leg: Continue Home Health Visits - Monday and Friday Home Health Nurse may visit PRN to address patient s wound care needs. FACE TO FACE ENCOUNTER: MEDICARE and MEDICAID PATIENTS: I certify that this patient is under my care and that I had [name] face-to-face encounter that meets the physician face-to-face encounter requirements with this patient on this date. The encounter with the patient was in whole or in part for the following MEDICAL CONDITION: (primary reason for [place]) MEDICAL NECESSITY: I certify, that based on my findings, NURSING services are [name] medically necessary home health service. HOME BOUND STATUS: I certify that my clinical findings support that this patient is homebound (i.e., Due to illness or injury, pt requires aid of supportive devices such as crutches, cane, wheelchairs, walkers, the use of special transportation or the assistance of another person to leave their place of residence. There is [name] normal inability to leave the home and doing so requires considerable and taxing effort. Other absences are for medical reasons / religious services and are infrequent or of short duration when for other reasons). If current dressing causes regression in wound condition, may D/C ordered dressing product/s and apply Normal Saline Moist Dressing daily until next [place] / Other MD appointment. [place] of regression in wound condition at [phone]. Wound #8 Left,Proximal,Posterior Lower Leg: Continue Home Health Visits - Monday and Friday Home Health Nurse may visit PRN to address patient s wound care  needs. FACE TO FACE ENCOUNTER: MEDICARE and MEDICAID PATIENTS: I certify that this patient is under my care and that I had [name] face-to-face encounter that meets the physician face-to-face encounter requirements with this patient on this date. The encounter with the patient was in whole or in part for the following MEDICAL CONDITION: (primary reason for [place]) MEDICAL NECESSITY: I certify, that based on my findings, NURSING services are [name] medically necessary home health service. HOME BOUND STATUS: I certify that my clinical findings support that this patient is homebound (i.e., Due to illness or injury, pt requires aid of supportive devices such as crutches, cane, wheelchairs, walkers, the use of special transportation or the assistance of another person to leave their place of residence. There is [name] normal inability to leave the home and doing so requires considerable  and taxing effort. Other absences are for medical reasons / religious services and are infrequent or of short duration when for other reasons). If current dressing causes regression in wound condition, may D/C ordered dressing product/s and apply Normal Saline Moist Dressing daily until next [place] / Other MD appointment. [place] of regression in wound condition at [phone]. Medical Decision Making Non-pressure chronic ulcer of left calf with necrosis of muscle 12/28/2017 Status: Improving Complications: culture of this last week grew Pseudomonas. We Escribed and oral third-generation cephalosporin cefdinir 300 twice [name] day for 7 days Interventions: continue silver alternate complete antibiotics Lymphedema, not elsewhere classified 12/28/2017 Status: No change Complications: weight today is essentially stable at 383 pounds. Interventions: we are using 4-layer compression with rolled ABDs over the wound area. He also has compression pumps he is using these twice [name] day at home. Non-pressure  chronic ulcer of right calf limited to breakdown of skin 02/22/2018 Dunphy, Javanni [name] ([id number]) Status: Improving Complications: there is no open area on this leg. Interventions: patient has [name] wraparound compression stocking prescribed by Dr. Alessandra Grout were very to put this on today #1 complete antibiotic cefdinir #2 still silver alginate to the wounds in the left leg we are using 4-layer compression here. #3 and the patient is fine to go into his new brown stocking on the right leg. #4 he is compliant with his compression pumps 2 or 3 times [name] day #5 I am concerned about this man's depression. I have strongly suggested going to see his primary physician.[name] He is medical and psychosocial issues that certainly put stressors on this Electronic Signature(s) Signed: 03/03/2018 8:34:07 AM By: Linton Ham MD Entered By: Linton Ham on 03/01/2018 10:55:16 Ramnauth, Deven [name] ([id number]) -------------------------------------------------------------------------------- SuperBill Details Patient Name: Fouts, Kevin [name] Date of Service: 03/01/2018 Medical Record Number: [id number] Patient Account Number: [id number] Date of Birth/Sex: September 02, 1970 (47 y.o. M) Treating RN: Cornell Barman Primary Care Provider: Gaetano Net Other Clinician: Referring Provider: Gaetano Net Treating Provider/Extender: Tito Dine in Treatment: 9 Diagnosis Coding ICD-10 Codes Code Description [phone] Non-pressure chronic ulcer of left calf with necrosis of muscle I89.0 Lymphedema, not elsewhere classified I87.332 Chronic venous hypertension (idiopathic) with ulcer and inflammation of left lower extremity E11.622 Type 2 diabetes mellitus with other skin ulcer L97.211 Non-pressure chronic ulcer of right calf limited to breakdown of skin Facility Procedures CPT4 Code: [id number] Description: (Facility Use Only) [phone] - APPLY [place] LWR LT LEG Modifier: Quantity: 1 Physician Procedures CPT4 Code:  [id number] Description: 38381 - WC PHYS LEVEL 4 - EST PT ICD-10 Diagnosis Description L97.223 Non-pressure chronic ulcer of left calf with necrosis of mu I89.0 Lymphedema, not elsewhere classified L97.211 Non-pressure chronic ulcer of right calf limited to breakdo Modifier: scle wn of skin Quantity: 1 Electronic Signature(s) Signed: 03/03/2018 8:34:07 AM By: Linton Ham MD Entered By: Linton Ham on 03/01/2018 10:55:51

## 2018-03-03 NOTE — Telephone Encounter (Signed)
Anderson Malta with the [employer] was calling about some office visit notes for patient for his short term disability, She can be reached at [phone].

## 2018-03-15 ENCOUNTER — Encounter: Payer: BLUE CROSS/BLUE SHIELD | Admitting: Internal Medicine

## 2018-03-16 NOTE — Progress Notes (Signed)
OWAIS, PRUETT ([id number]) Visit Report for 03/15/2018 Arrival Information Details Patient Name: Detjen, Jarel A. Date of Service: 03/15/2018 9:15 AM Medical Record Number: [id number] Patient Account Number: [id number] Date of Birth/Sex: Dec 01, 1970 (47 y.o. M) Treating RN: Ahmed Prima Primary Care Amisha Pospisil: Gaetano Net Other Clinician: Referring Deon Ivey: Gaetano Net Treating Lauryl Seyer/Extender: Tito Dine in Treatment: 11 Visit Information History Since Last Visit All ordered tests and consults were completed: No Patient Arrived: Ambulatory Added or deleted any medications: No Arrival Time: 09:41 Any new allergies or adverse reactions: No Accompanied By: self Had a fall or experienced change in No Transfer Assistance: None activities of daily living that may affect Secondary Verification Process Yes risk of falls: Completed: Signs or symptoms of abuse/neglect since last visito No Patient Requires Transmission-Based No Hospitalized since last visit: No Precautions: Implantable device outside of the clinic excluding No Patient Has Alerts: Yes cellular tissue based products placed in the center Patient Alerts: DM II since last visit: noncompressible Has Dressing in Place as Prescribed: Yes bilateral Has Compression in Place as Prescribed: Yes Pain Present Now: No Electronic Signature(s) Signed: 03/15/2018 5:07:56 PM By: Alric Quan Entered By: Alric Quan on 03/15/2018 09:41:23 Soh, Lyndel A. ([id number]) -------------------------------------------------------------------------------- Clinic Level of Care Assessment Details Patient Name: Buick, Quantez A. Date of Service: 03/15/2018 9:15 AM Medical Record Number: [id number] Patient Account Number: [id number] Date of Birth/Sex: 28-May-1971 (47 y.o. M) Treating RN: Roger Shelter Primary Care Foster Sonnier: Gaetano Net Other Clinician: Referring Analis Distler: Gaetano Net Treating Olamide Carattini/Extender:  Tito Dine in Treatment: 11 Clinic Level of Care Assessment Items TOOL 4 Quantity Score X - Use when only an EandM is performed on FOLLOW-UP visit 1 0 ASSESSMENTS - Nursing Assessment / Reassessment X - Reassessment of Co-morbidities (includes updates in patient status) 1 10 X- 1 5 Reassessment of Adherence to Treatment Plan ASSESSMENTS - Wound and Skin Assessment / Reassessment []  - Simple Wound Assessment / Reassessment - one wound 0 X- 5 5 Complex Wound Assessment / Reassessment - multiple wounds []  - 0 Dermatologic / Skin Assessment (not related to wound area) ASSESSMENTS - Focused Assessment []  - Circumferential Edema Measurements - multi extremities 0 []  - 0 Nutritional Assessment / Counseling / Intervention []  - 0 Lower Extremity Assessment (monofilament, tuning fork, pulses) []  - 0 Peripheral Arterial Disease Assessment (using hand held doppler) ASSESSMENTS - Ostomy and/or Continence Assessment and Care []  - Incontinence Assessment and Management 0 []  - 0 Ostomy Care Assessment and Management (repouching, etc.) PROCESS - Coordination of Care []  - Simple Patient / Family Education for ongoing care 0 X- 1 20 Complex (extensive) Patient / Family Education for ongoing care []  - 0 Staff obtains Programmer, systems, Records, Test Results / Process Orders []  - 0 Staff telephones HHA, Nursing Homes / Clarify orders / etc []  - 0 Routine Transfer to another Facility (non-emergent condition) []  - 0 Routine Hospital Admission (non-emergent condition) []  - 0 New Admissions / Biomedical engineer / Ordering NPWT, Apligraf, etc. []  - 0 Emergency Hospital Admission (emergent condition) []  - 0 Simple Discharge Coordination Coey, Cord A. ([id number]) X- 1 15 Complex (extensive) Discharge Coordination PROCESS - Special Needs []  - Pediatric / Minor Patient Management 0 []  - 0 Isolation Patient Management []  - 0 Hearing / Language / Visual special needs []  -  0 Assessment of Community assistance (transportation, D/C planning, etc.) []  - 0 Additional assistance / Altered mentation []  - 0 Support Surface(s) Assessment (bed, cushion, seat, etc.) INTERVENTIONS - Wound Cleansing /  Measurement []  - Simple Wound Cleansing - one wound 0 X- 5 5 Complex Wound Cleansing - multiple wounds X- 1 5 Wound Imaging (photographs - any number of wounds) []  - 0 Wound Tracing (instead of photographs) []  - 0 Simple Wound Measurement - one wound X- 5 5 Complex Wound Measurement - multiple wounds INTERVENTIONS - Wound Dressings X - Small Wound Dressing one or multiple wounds 4 10 X- 1 15 Medium Wound Dressing one or multiple wounds []  - 0 Large Wound Dressing one or multiple wounds []  - 0 Application of Medications - topical []  - 0 Application of Medications - injection INTERVENTIONS - Miscellaneous []  - External ear exam 0 []  - 0 Specimen Collection (cultures, biopsies, blood, body fluids, etc.) []  - 0 Specimen(s) / Culture(s) sent or taken to Lab for analysis []  - 0 Patient Transfer (multiple staff / Civil Service fast streamer / Similar devices) []  - 0 Simple Staple / Suture removal (25 or less) []  - 0 Complex Staple / Suture removal (26 or more) []  - 0 Hypo / Hyperglycemic Management (close monitor of Blood Glucose) []  - 0 Ankle / Brachial Index (ABI) - do not check if billed separately X- 1 5 Vital Signs Weiand, Isael A. ([id number]) Has the patient been seen at the hospital within the last three years: Yes Total Score: 190 Level Of Care: New/Established - Level 5 Electronic Signature(s) Signed: 03/15/2018 4:58:57 PM By: Roger Shelter Entered By: Roger Shelter on 03/15/2018 10:18:09 Kory, Harel A. ([id number]) -------------------------------------------------------------------------------- Encounter Discharge Information Details Patient Name: Wessling, Pinchus A. Date of Service: 03/15/2018 9:15 AM Medical Record Number: [id number] Patient Account  Number: [id number] Date of Birth/Sex: Jul 26, 1971 (47 y.o. M) Treating RN: Montey Hora Primary Care Deena Shaub: Gaetano Net Other Clinician: Referring Natali Lavallee: Gaetano Net Treating Forrest Jaroszewski/Extender: Tito Dine in Treatment: 11 Encounter Discharge Information Items Discharge Condition: Stable Ambulatory Status: Ambulatory Discharge Destination: Home Transportation: Private Auto Accompanied By: self Schedule Follow-up Appointment: Yes Clinical Summary of Care: Electronic Signature(s) Signed: 03/15/2018 12:48:00 PM By: Montey Hora Entered By: Montey Hora on 03/15/2018 12:47:59 Snethen, Josede A. ([id number]) -------------------------------------------------------------------------------- Lower Extremity Assessment Details Patient Name: Rys, Luisenrique A. Date of Service: 03/15/2018 9:15 AM Medical Record Number: [id number] Patient Account Number: [id number] Date of Birth/Sex: [birth year] (47 y.o. M) Treating RN: Ahmed Prima Primary Care Meliton Samad: Gaetano Net Other Clinician: Referring Lili Harts: Gaetano Net Treating Archer Vise/Extender: Tito Dine in Treatment: 11 Edema Assessment Assessed: [Left: No] [Right: No] [Left: Edema] [Right: :] Calf Left: Right: Point of Measurement: 42 cm From Medial Instep 61.7 cm 53.3 cm Ankle Left: Right: Point of Measurement: 12 cm From Medial Instep 50 cm 38 cm Vascular Assessment Pulses: Dorsalis Pedis Palpable: [Left:Yes] [Right:Yes] Posterior Tibial Extremity colors, hair growth, and conditions: Extremity Color: [Left:Red] [Right:Red] Hair Growth on Extremity: [Left:Yes] [Right:Yes] Temperature of Extremity: [Left:Warm] [Right:Cool] Capillary Refill: [Left:< 3 seconds] [Right:< 3 seconds] Toe Nail Assessment Left: Right: Thick: Yes Yes Discolored: Yes Yes Deformed: Yes Yes Improper Length and Hygiene: Yes Yes Electronic Signature(s) Signed: 03/15/2018 5:07:56 PM By: Alric Quan Entered By:  Alric Quan on 03/15/2018 10:07:37 Blye, Saathvik A. ([id number]) -------------------------------------------------------------------------------- Multi Wound Chart Details Patient Name: Jurczyk, Berlyn A. Date of Service: 03/15/2018 9:15 AM Medical Record Number: [id number] Patient Account Number: [id number] Date of Birth/Sex: Oct 14, 1970 (47 y.o. M) Treating RN: Roger Shelter Primary Care Sahalie Beth: Gaetano Net Other Clinician: Referring Jaysion Ramseyer: Gaetano Net Treating Jerran Tappan/Extender: Tito Dine in Treatment: 11 Vital Signs Height(in): 76 Pulse(bpm): 59 Weight(lbs): 379.5 Blood Pressure(mmHg): 175/72  Body Mass Index(BMI): 46 Temperature(F): 98.2 Respiratory Rate 18 (breaths/min): Photos: [10:No Photos] [11:No Photos] [2:No Photos] Wound Location: [10:Right Lower Leg - Medial, Proximal] [11:Right Lower Leg - Medial, Distal] [2:Left Lower Leg - Lateral, Posterior] Wounding Event: [10:Gradually Appeared] [11:Gradually Appeared] [2:Gradually Appeared] Primary Etiology: [10:Lymphedema] [11:Lymphedema] [2:Diabetic Wound/Ulcer of the Lower Extremity] Secondary Etiology: [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] [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] [2:Lymphedema] Comorbid History: [10:Hypertension, Type II Diabetes, Neuropathy] [11:Hypertension, Type II Diabetes, Neuropathy] [2:Hypertension, Type II Diabetes, Neuropathy] Date Acquired: [10:03/15/2018] [11:03/15/2018] [2:09/30/2017] Weeks of Treatment: [10:0] [11:0] [2:11] Wound Status: [10:Open] [11:Open] [2:Open] Clustered Wound: [10:Yes] [11:No] [2:No] Clustered Quantity: [10:2] [11:N/A] [2:N/A] Measurements L x W x D [10:1.8x1.2x0.1] [11:1x1.5x0.1] [2:2.2x4.7x1] (cm) Area (cm) : [10:1.696] [11:1.178] [2:8.121] Volume (cm) : [10:0.17] [11:0.118] [2:8.121] % Reduction in Area: [10:0.00%] [11:N/A] [2:71.30%] % Reduction in Volume: [10:0.00%] [11:N/A] [2:28.20%] Starting Position 1  [2:11] (o'clock): Ending Position 1 [2:12] (o'clock): Maximum Distance 1 (cm): [2:0.7] Undermining: [10:No] [11:No] [2:Yes] Classification: [10:Full Thickness Without Exposed Support Structures] [11:Full Thickness Without Exposed Support Structures] [2:Grade 2] Exudate Amount: [10:Large] [11:Large] [2:Large] Exudate Type: [10:Serous] [11:Serous] [2:Serosanguineous] Exudate Color: [10:amber] [11:amber] [2:red, brown] Wound Margin: [10:Distinct, outline attached] [11:Distinct, outline attached] [2:Distinct, outline attached] Granulation Amount: [10:None Present (0%)] [11:None Present (0%)] [2:Medium (34-66%)] Granulation Quality: [10:N/A] [11:N/A] [2:Red] Necrotic Amount: [10:Large (67-100%)] [11:Large (67-100%)] [2:Medium (34-66%)] Necrotic Tissue: [10:Adherent Slough] [11:Adherent Slough] [2:Adherent Slough] Exposed Structures: Fascia: No Fascia: No Fascia: No Fat Layer (Subcutaneous Fat Layer (Subcutaneous Fat Layer (Subcutaneous Tissue) Exposed: No Tissue) Exposed: No Tissue) Exposed: No Tendon: No Tendon: No Tendon: No Muscle: No Muscle: No Muscle: No Joint: No Joint: No Joint: No Bone: No Bone: No Bone: No Epithelialization: None None None Periwound Skin Texture: No Abnormalities Noted No Abnormalities Noted No Abnormalities Noted Periwound Skin Moisture: No Abnormalities Noted No Abnormalities Noted Maceration: Yes Periwound Skin Color: No Abnormalities Noted No Abnormalities Noted No Abnormalities Noted Temperature: No Abnormality No Abnormality No Abnormality Tenderness on Palpation: Yes Yes Yes Wound Preparation: Ulcer Cleansing: Ulcer Cleansing: Ulcer Cleansing: Rinsed/Irrigated with Saline, Rinsed/Irrigated with Saline, Rinsed/Irrigated with Saline, Other: soap and water Other: soap and water Other: soap and water Topical Anesthetic Applied: Topical Anesthetic Applied: Topical Anesthetic Applied: Other: lidocaine 4% Other: lidocaine 4% Other: lidocaine  4% Wound Number: [id number]  Photos: No Photos No Photos No Photos Wound Location: Left Lower Leg - Medial, Left Lower Leg - Posterior, Right Lower Leg - Posterior Proximal Proximal Wounding Event: Gradually Appeared Gradually Appeared Gradually Appeared Primary Etiology: Lymphedema Lymphedema Lymphedema Secondary Etiology: N/A N/A Diabetic Wound/Ulcer of the Lower Extremity Comorbid History: Hypertension, Type II Hypertension, Type II Hypertension, Type II Diabetes, Neuropathy Diabetes, Neuropathy Diabetes, Neuropathy Date Acquired: 02/15/2018 02/22/2018 03/08/2018 Weeks of Treatment: 4 3 0 Wound Status: Open Open Open Clustered Wound: No Yes No Clustered Quantity: N/A 3 N/A Measurements L x W x D 1.8x4.5x0.1 1x0.8x0.2 3.5x3.8x0.1 (cm) Area (cm) : 6.362 0.628 10.446 Volume (cm) : 0.636 0.126 1.045 % Reduction in Area: -636.30% 97.50% N/A % Reduction in Volume: -639.50% 94.90% N/A Undermining: No No No Classification: Full Thickness Without Full Thickness Without Full Thickness Without Exposed Support Structures Exposed Support Structures Exposed Support Structures Exudate Amount: Large Large Large Exudate Type: Serosanguineous Serosanguineous Serous Exudate Color: red, brown red, brown amber Wound Margin: Flat and Intact Distinct, outline attached Distinct, outline attached Granulation Amount: Large (67-100%) Medium (34-66%) Large (67-100%) Granulation Quality: Red Red Pink Necrotic Amount: Small (1-33%) Medium (34-66%) Small (1-33%) Necrotic Tissue: Eschar  Eschar, Adherent [place] Exposed Structures: Fat Layer (Subcutaneous Fat Layer (Subcutaneous Fascia: No Tissue) Exposed: Yes Tissue) Exposed: Yes Fat Layer (Subcutaneous Fascia: No Fascia: No Tissue) Exposed: No Tendon: No Tendon: No Tendon: No Muscle: No Muscle: No Muscle: No Rossano, Kylin A. ([id number]) Joint: No Joint: No Joint: No Bone: No Bone: No Bone: No Epithelialization: None None  None Periwound Skin Texture: Excoriation: No Excoriation: No No Abnormalities Noted Induration: No Induration: No Callus: No Callus: No Crepitus: No Crepitus: No Rash: No Rash: No Scarring: No Scarring: No Periwound Skin Moisture: Maceration: No Dry/Scaly: Yes No Abnormalities Noted Dry/Scaly: No Maceration: No Periwound Skin Color: Atrophie Blanche: No Atrophie Blanche: No No Abnormalities Noted Cyanosis: No Cyanosis: No Ecchymosis: No Ecchymosis: No Erythema: No Erythema: No Hemosiderin Staining: No Hemosiderin Staining: No Mottled: No Mottled: No Pallor: No Pallor: No Rubor: No Rubor: No Temperature: No Abnormality N/A No Abnormality Tenderness on Palpation: Yes No Yes Wound Preparation: Ulcer Cleansing: Ulcer Cleansing: Ulcer Cleansing: Rinsed/Irrigated with Saline, Rinsed/Irrigated with Saline, Rinsed/Irrigated with Saline, Other: soap and water Other: soap and water Other: soap and water Topical Anesthetic Applied: Topical Anesthetic Applied: Topical Anesthetic Applied: Other: lidocaine 4% Other: lidocaine 4% Other: lidocaine 4% Treatment Notes Electronic Signature(s) Signed: 03/15/2018 4:58:57 PM By: Roger Shelter Entered By: Roger Shelter on 03/15/2018 10:14:02 Trigueros, Roslyn A. ([id number]) -------------------------------------------------------------------------------- [place] Details Patient Name: Gouin, Arthor A. Date of Service: 03/15/2018 9:15 AM Medical Record Number: [id number] Patient Account Number: [id number] Date of Birth/Sex: May 07, 1971 (47 y.o. M) Treating RN: Roger Shelter Primary Care Carlin Attridge: Gaetano Net Other Clinician: Referring Lakesha Levinson: Gaetano Net Treating Cotton Beckley/Extender: Tito Dine in Treatment: 11 Active Inactive ` Abuse / Safety / Falls / Self Care Management Nursing Diagnoses: Potential for falls Goals: Patient will remain injury free related to falls Date Initiated:  12/28/2017 Target Resolution Date: 04/08/2018 Goal Status: Active Interventions: Assess fall risk on admission and as needed Notes: ` Nutrition Nursing Diagnoses: Impaired glucose control: actual or potential Goals: Patient/caregiver verbalizes understanding of need to maintain therapeutic glucose control per primary care physician Date Initiated: 12/28/2017 Target Resolution Date: 04/08/2018 Goal Status: Active Interventions: Provide education on elevated blood sugars and impact on wound healing Notes: ` Orientation to the Wound Care Program Nursing Diagnoses: Knowledge deficit related to the wound healing center program Goals: Patient/caregiver will verbalize understanding of the [facility] Date Initiated: 12/28/2017 Target Resolution Date: 04/08/2018 Goal Status: Active Interventions: Juday, Chin A. ([id number]) Provide education on orientation to the wound center Notes: ` Wound/Skin Impairment Nursing Diagnoses: Impaired tissue integrity Goals: Ulcer/skin breakdown will heal within 14 weeks Date Initiated: 12/28/2017 Target Resolution Date: 04/08/2018 Goal Status: Active Interventions: Assess patient/caregiver ability to obtain necessary supplies Assess patient/caregiver ability to perform ulcer/skin care regimen upon admission and as needed Assess ulceration(s) every visit Notes: Electronic Signature(s) Signed: 03/15/2018 4:58:57 PM By: Roger Shelter Entered By: Roger Shelter on 03/15/2018 10:13:50 Ator, Orin A. ([id number]) -------------------------------------------------------------------------------- Pain Assessment Details Patient Name: Philyaw, Sevin A. Date of Service: 03/15/2018 9:15 AM Medical Record Number: [id number] Patient Account Number: [id number] Date of Birth/Sex: 1971-06-30 (47 y.o. M) Treating RN: Ahmed Prima Primary Care Jenifer Struve: Gaetano Net Other Clinician: Referring Jerriah Ines: Gaetano Net Treating Azyriah Nevins/Extender:  Tito Dine in Treatment: 11 Active Problems Location of Pain Severity and Description of Pain Patient Has Paino No Site Locations Pain Management and Medication Current Pain Management: Electronic Signature(s) Signed: 03/15/2018 5:07:56 PM By: Alric Quan Entered By: Alric Quan on 03/15/2018 09:41:30  Hascall, Maveryck A. ([id number]) -------------------------------------------------------------------------------- Patient/Caregiver Education Details Patient Name: Mckibbin, Cameryn A. Date of Service: 03/15/2018 9:15 AM Medical Record Number: [id number] Patient Account Number: [id number] Date of Birth/Gender: May 29, 1971 (47 y.o. M) Treating RN: Montey Hora Primary Care Physician: Gaetano Net Other Clinician: Referring Physician: Gaetano Net Treating Physician/Extender: Tito Dine in Treatment: 11 Education Assessment Education Provided To: Patient Education Topics Provided Venous: Handouts: Other: leg elevation Methods: Explain/Verbal Responses: State content correctly Electronic Signature(s) Signed: 03/15/2018 5:29:35 PM By: Montey Hora Entered By: Montey Hora on 03/15/2018 12:48:26 [place]ville, Eschol A. ([id number]) -------------------------------------------------------------------------------- Wound Assessment Details Patient Name: Westerfeld, Edgardo A. Date of Service: 03/15/2018 9:15 AM Medical Record Number: [id number] Patient Account Number: [id number] Date of Birth/Sex: 05/03/1971 (47 y.o. M) Treating RN: Ahmed Prima Primary Care Sobia Karger: Gaetano Net Other Clinician: Referring Ileen Kahre: Gaetano Net Treating Sharan Mcenaney/Extender: Tito Dine in Treatment: 11 Wound Status Wound Number: 10 Primary Etiology: Lymphedema Wound Location: Right Lower Leg - Medial, Proximal Secondary Diabetic Wound/Ulcer of the Lower Etiology: Extremity Wounding Event: Gradually Appeared Wound Status: Open Date Acquired: 03/15/2018 Comorbid  History: Hypertension, Type II Diabetes, Weeks Of Treatment: 0 Neuropathy Clustered Wound: Yes Photos Photo Uploaded By: Alric Quan on 03/15/2018 16:38:39 Wound Measurements Length: (cm) 1.8 Width: (cm) 1.2 Depth: (cm) 0.1 Clustered Quantity: 2 Area: (cm) 1.696 Volume: (cm) 0.17 % Reduction in Area: 0% % Reduction in Volume: 0% Epithelialization: None Tunneling: No Undermining: No Wound Description Full Thickness Without Exposed Support Classification: Structures Wound Margin: Distinct, outline attached Exudate Large Amount: Exudate Type: Serous Exudate Color: amber Foul Odor After Cleansing: No Slough/Fibrino Yes Wound Bed Granulation Amount: None Present (0%) Exposed Structure Necrotic Amount: Large (67-100%) Fascia Exposed: No Necrotic Quality: Adherent Slough Fat Layer (Subcutaneous Tissue) Exposed: No Tendon Exposed: No Muscle Exposed: No Joint Exposed: No Rummell, Orel A. ([id number]) Bone Exposed: No Periwound Skin Texture Texture Color No Abnormalities Noted: No No Abnormalities Noted: No Moisture Temperature / Pain No Abnormalities Noted: No Temperature: No Abnormality Tenderness on Palpation: Yes Wound Preparation Ulcer Cleansing: Rinsed/Irrigated with Saline, Other: soap and water, Topical Anesthetic Applied: Other: lidocaine 4%, Treatment Notes Wound #10 (Right, Proximal, Medial Lower Leg) 1. Cleansed with: Clean wound with Normal Saline Cleanse wound with antibacterial soap and water 2. Anesthetic Topical Lidocaine 4% cream to wound bed prior to debridement 3. Peri-wound Care: Moisturizing lotion 4. Dressing Applied: Calcium Alginate with Silver 5. Secondary Dressing Applied ABD Pad 7. Secured with 4 Layer Compression System - Bilateral Notes silvercel Electronic Signature(s) Signed: 03/15/2018 5:07:56 PM By: Alric Quan Entered By: Alric Quan on 03/15/2018 10:02:49 Spainhower, Cleotis A.  ([id number]) -------------------------------------------------------------------------------- Wound Assessment Details Patient Name: Preis, Oaklee A. Date of Service: 03/15/2018 9:15 AM Medical Record Number: [id number] Patient Account Number: [id number] Date of Birth/Sex: Nov 11, 1970 (47 y.o. M) Treating RN: Ahmed Prima Primary Care Vail Vuncannon: Gaetano Net Other Clinician: Referring Drew Herman: Gaetano Net Treating Rosaire Cueto/Extender: Tito Dine in Treatment: 11 Wound Status Wound Number: 11 Primary Etiology: Lymphedema Wound Location: Right Lower Leg - Medial, Distal Secondary Diabetic Wound/Ulcer of the Lower Etiology: Extremity Wounding Event: Gradually Appeared Wound Status: Open Date Acquired: 03/15/2018 Comorbid History: Hypertension, Type II Diabetes, Weeks Of Treatment: 0 Neuropathy Clustered Wound: No Photos Photo Uploaded By: Alric Quan on 03/15/2018 16:37:04 Wound Measurements Length: (cm) 1 Width: (cm) 1.5 Depth: (cm) 0.1 Area: (cm) 1.178 Volume: (cm) 0.118 % Reduction in Area: % Reduction in Volume: Epithelialization: None Tunneling: No Undermining: No Wound Description Full Thickness Without Exposed Support Classification: Structures  Wound Margin: Distinct, outline attached Exudate Large Amount: Exudate Type: Serous Exudate Color: amber Wound Bed Granulation Amount: None Present (0%) Exposed Structure Necrotic Amount: Large (67-100%) Fascia Exposed: No Necrotic Quality: Adherent Slough Fat Layer (Subcutaneous Tissue) Exposed: No Tendon Exposed: No Muscle Exposed: No Joint Exposed: No Bone Exposed: No Speigner, Marlowe A. ([id number]) Periwound Skin Texture Texture Color No Abnormalities Noted: No No Abnormalities Noted: No Moisture Temperature / Pain No Abnormalities Noted: No Temperature: No Abnormality Tenderness on Palpation: Yes Wound Preparation Ulcer Cleansing: Rinsed/Irrigated with Saline, Other: soap and  water, Topical Anesthetic Applied: Other: lidocaine 4%, Treatment Notes Wound #11 (Right, Distal, Medial Lower Leg) 1. Cleansed with: Clean wound with Normal Saline Cleanse wound with antibacterial soap and water 2. Anesthetic Topical Lidocaine 4% cream to wound bed prior to debridement 3. Peri-wound Care: Moisturizing lotion 4. Dressing Applied: Calcium Alginate with Silver 5. Secondary Dressing Applied ABD Pad 7. Secured with 4 Layer Compression System - Bilateral Notes silvercel Electronic Signature(s) Signed: 03/15/2018 5:07:56 PM By: Alric Quan Entered By: Alric Quan on 03/15/2018 10:04:52 Rahl, Cleone A. ([id number]) -------------------------------------------------------------------------------- Wound Assessment Details Patient Name: Brumbach, Jasim A. Date of Service: 03/15/2018 9:15 AM Medical Record Number: [id number] Patient Account Number: [id number] Date of Birth/Sex: Jan 16, 1971 (47 y.o. M) Treating RN: Ahmed Prima Primary Care Scarlett Portlock: Gaetano Net Other Clinician: Referring Estelle Skibicki: Gaetano Net Treating Lillyauna Jenkinson/Extender: Tito Dine in Treatment: 11 Wound Status Wound Number: 2 Primary Etiology: Diabetic Wound/Ulcer of the Lower Extremity Wound Location: Left Lower Leg - Lateral, Posterior Secondary Lymphedema Wounding Event: Gradually Appeared Etiology: Date Acquired: 09/30/2017 Wound Status: Open Weeks Of Treatment: 11 Comorbid History: Hypertension, Type II Diabetes, Clustered Wound: No Neuropathy Photos Photo Uploaded By: Alric Quan on 03/15/2018 16:37:04 Wound Measurements Length: (cm) 2.2 Width: (cm) 4.7 Depth: (cm) 1 Area: (cm) 8.121 Volume: (cm) 8.121 % Reduction in Area: 71.3% % Reduction in Volume: 28.2% Epithelialization: None Tunneling: No Undermining: Yes Starting Position (o'clock): 11 Ending Position (o'clock): 12 Maximum Distance: (cm) 0.7 Wound Description Classification: Grade 2 Wound  Margin: Distinct, outline attached Exudate Amount: Large Exudate Type: Serosanguineous Exudate Color: red, brown Foul Odor After Cleansing: No Slough/Fibrino No Wound Bed Granulation Amount: Medium (34-66%) Exposed Structure Granulation Quality: Red Fascia Exposed: No Necrotic Amount: Medium (34-66%) Fat Layer (Subcutaneous Tissue) Exposed: No Necrotic Quality: Adherent Slough Tendon Exposed: No Muscle Exposed: No Grimmer, Kelyn A. ([id number]) Joint Exposed: No Bone Exposed: No Periwound Skin Texture Texture Color No Abnormalities Noted: No No Abnormalities Noted: No Moisture Temperature / Pain No Abnormalities Noted: No Temperature: No Abnormality Maceration: Yes Tenderness on Palpation: Yes Wound Preparation Ulcer Cleansing: Rinsed/Irrigated with Saline, Other: soap and water, Topical Anesthetic Applied: Other: lidocaine 4%, Treatment Notes Wound #2 (Left, Lateral, Posterior Lower Leg) 1. Cleansed with: Clean wound with Normal Saline Cleanse wound with antibacterial soap and water 2. Anesthetic Topical Lidocaine 4% cream to wound bed prior to debridement 3. Peri-wound Care: Moisturizing lotion 4. Dressing Applied: Calcium Alginate with Silver 5. Secondary Dressing Applied ABD Pad 7. Secured with 4 Layer Compression System - Bilateral Notes silvercel Electronic Signature(s) Signed: 03/15/2018 5:07:56 PM By: Alric Quan Entered By: Alric Quan on 03/15/2018 09:56:35 Buxbaum, Vidur A. ([id number]) -------------------------------------------------------------------------------- Wound Assessment Details Patient Name: Spradlin, Shaft A. Date of Service: 03/15/2018 9:15 AM Medical Record Number: [id number] Patient Account Number: [id number] Date of Birth/Sex: 12/09/1970 (47 y.o. M) Treating RN: Ahmed Prima Primary Care Theoden Mauch: Gaetano Net Other Clinician: Referring Devri Kreher: Gaetano Net Treating Hope Brandenburger/Extender: Tito Dine in Treatment:  11 Wound Status Wound Number: 7 Primary Etiology: Lymphedema Wound Location: Left Lower Leg - Medial, Proximal Wound Status: Open Wounding Event: Gradually Appeared Comorbid Hypertension, Type II Diabetes, History: Neuropathy Date Acquired: 02/15/2018 Weeks Of Treatment: 4 Clustered Wound: No Photos Photo Uploaded By: Alric Quan on 03/15/2018 16:38:40 Wound Measurements Length: (cm) 1.8 Width: (cm) 4.5 Depth: (cm) 0.1 Area: (cm) 6.362 Volume: (cm) 0.636 % Reduction in Area: -636.3% % Reduction in Volume: -639.5% Epithelialization: None Tunneling: No Undermining: No Wound Description Full Thickness Without Exposed Support Classification: Structures Wound Margin: Flat and Intact Exudate Large Amount: Exudate Type: Serosanguineous Exudate Color: red, brown Foul Odor After Cleansing: No Slough/Fibrino Yes Wound Bed Granulation Amount: Large (67-100%) Exposed Structure Granulation Quality: Red Fascia Exposed: No Necrotic Amount: Small (1-33%) Fat Layer (Subcutaneous Tissue) Exposed: Yes Necrotic Quality: Eschar Tendon Exposed: No Muscle Exposed: No Joint Exposed: No Bone Exposed: No Capili, Daquarius A. ([id number]) Periwound Skin Texture Texture Color No Abnormalities Noted: No No Abnormalities Noted: No Callus: No Atrophie Blanche: No Crepitus: No Cyanosis: No Excoriation: No Ecchymosis: No Induration: No Erythema: No Rash: No Hemosiderin Staining: No Scarring: No Mottled: No Pallor: No Moisture Rubor: No No Abnormalities Noted: No Dry / Scaly: No Temperature / Pain Maceration: No Temperature: No Abnormality Tenderness on Palpation: Yes Wound Preparation Ulcer Cleansing: Rinsed/Irrigated with Saline, Other: soap and water, Topical Anesthetic Applied: Other: lidocaine 4%, Treatment Notes Wound #7 (Left, Proximal, Medial Lower Leg) 1. Cleansed with: Clean wound with Normal Saline Cleanse wound with antibacterial soap and water 2.  Anesthetic Topical Lidocaine 4% cream to wound bed prior to debridement 3. Peri-wound Care: Moisturizing lotion 4. Dressing Applied: Calcium Alginate with Silver 5. Secondary Dressing Applied ABD Pad 7. Secured with 4 Layer Compression System - Bilateral Notes silvercel Electronic Signature(s) Signed: 03/15/2018 5:07:56 PM By: Alric Quan Entered By: Alric Quan on 03/15/2018 09:57:14 Nickson, Bode A. ([id number]) -------------------------------------------------------------------------------- Wound Assessment Details Patient Name: Narayan, Efrem A. Date of Service: 03/15/2018 9:15 AM Medical Record Number: [id number] Patient Account Number: [id number] Date of Birth/Sex: 1971/05/20 (47 y.o. M) Treating RN: Ahmed Prima Primary Care Eyob Godlewski: Gaetano Net Other Clinician: Referring Rickie Gutierres: Gaetano Net Treating Caree Wolpert/Extender: Tito Dine in Treatment: 11 Wound Status Wound Number: 8 Primary Etiology: Lymphedema Wound Location: Left Lower Leg - Posterior, Proximal Wound Status: Open Wounding Event: Gradually Appeared Comorbid Hypertension, Type II Diabetes, History: Neuropathy Date Acquired: 02/22/2018 Weeks Of Treatment: 3 Clustered Wound: Yes Photos Photo Uploaded By: Alric Quan on 03/15/2018 16:39:30 Wound Measurements Length: (cm) 1 Width: (cm) 0.8 Depth: (cm) 0.2 Clustered Quantity: 3 Area: (cm) 0.628 Volume: (cm) 0.126 % Reduction in Area: 97.5% % Reduction in Volume: 94.9% Epithelialization: None Tunneling: No Undermining: No Wound Description Full Thickness Without Exposed Support Classification: Structures Wound Margin: Distinct, outline attached Exudate Large Amount: Exudate Type: Serosanguineous Exudate Color: red, brown Foul Odor After Cleansing: No Slough/Fibrino Yes Wound Bed Granulation Amount: Medium (34-66%) Exposed Structure Granulation Quality: Red Fascia Exposed: No Necrotic Amount: Medium  (34-66%) Fat Layer (Subcutaneous Tissue) Exposed: Yes Necrotic Quality: Eschar, Adherent Slough Tendon Exposed: No Muscle Exposed: No Joint Exposed: No Vezina, Azul A. ([id number]) Bone Exposed: No Periwound Skin Texture Texture Color No Abnormalities Noted: No No Abnormalities Noted: No Callus: No Atrophie Blanche: No Crepitus: No Cyanosis: No Excoriation: No Ecchymosis: No Induration: No Erythema: No Rash: No Hemosiderin Staining: No Scarring: No Mottled: No Pallor: No Moisture Rubor: No No Abnormalities Noted: No Dry / Scaly: Yes Maceration: No Wound Preparation Ulcer Cleansing: Rinsed/Irrigated with  Saline, Other: soap and water, Topical Anesthetic Applied: Other: lidocaine 4%, Treatment Notes Wound #8 (Left, Proximal, Posterior Lower Leg) 1. Cleansed with: Clean wound with Normal Saline Cleanse wound with antibacterial soap and water 2. Anesthetic Topical Lidocaine 4% cream to wound bed prior to debridement 3. Peri-wound Care: Moisturizing lotion 4. Dressing Applied: Calcium Alginate with Silver 5. Secondary Dressing Applied ABD Pad 7. Secured with 4 Layer Compression System - Bilateral Notes silvercel Electronic Signature(s) Signed: 03/15/2018 5:07:56 PM By: Alric Quan Entered By: Alric Quan on 03/15/2018 09:57:47 Morea, Caine A. ([id number]) -------------------------------------------------------------------------------- Wound Assessment Details Patient Name: Pifer, Ferdinando A. Date of Service: 03/15/2018 9:15 AM Medical Record Number: [id number] Patient Account Number: [id number] Date of Birth/Sex: 1971/08/24 (47 y.o. M) Treating RN: Ahmed Prima Primary Care Malaney Mcbean: Gaetano Net Other Clinician: Referring Lealer Marsland: Gaetano Net Treating Nekoda Chock/Extender: Tito Dine in Treatment: 11 Wound Status Wound Number: 9 Primary Etiology: Lymphedema Wound Location: Right Lower Leg - Posterior Secondary Diabetic Wound/Ulcer of  the Lower Etiology: Extremity Wounding Event: Gradually Appeared Wound Status: Open Date Acquired: 03/08/2018 Comorbid History: Hypertension, Type II Diabetes, Weeks Of Treatment: 0 Neuropathy Clustered Wound: No Photos Photo Uploaded By: Alric Quan on 03/15/2018 16:39:31 Wound Measurements Length: (cm) 3.5 Width: (cm) 3.8 Depth: (cm) 0.1 Area: (cm) 10.446 Volume: (cm) 1.045 % Reduction in Area: % Reduction in Volume: Epithelialization: None Tunneling: No Undermining: No Wound Description Full Thickness Without Exposed Support Classification: Structures Wound Margin: Distinct, outline attached Exudate Large Amount: Exudate Type: Serous Exudate Color: amber Foul Odor After Cleansing: No Slough/Fibrino Yes Wound Bed Granulation Amount: Large (67-100%) Exposed Structure Granulation Quality: Pink Fascia Exposed: No Necrotic Amount: Small (1-33%) Fat Layer (Subcutaneous Tissue) Exposed: No Necrotic Quality: Adherent Slough Tendon Exposed: No Muscle Exposed: No Joint Exposed: No Bone Exposed: No Cape, Nabeel A. ([id number]) Periwound Skin Texture Texture Color No Abnormalities Noted: No No Abnormalities Noted: No Moisture Temperature / Pain No Abnormalities Noted: No Temperature: No Abnormality Tenderness on Palpation: Yes Wound Preparation Ulcer Cleansing: Rinsed/Irrigated with Saline, Other: soap and water, Topical Anesthetic Applied: Other: lidocaine 4%, Treatment Notes Wound #9 (Right, Posterior Lower Leg) 1. Cleansed with: Clean wound with Normal Saline Cleanse wound with antibacterial soap and water 2. Anesthetic Topical Lidocaine 4% cream to wound bed prior to debridement 3. Peri-wound Care: Moisturizing lotion 4. Dressing Applied: Calcium Alginate with Silver 5. Secondary Dressing Applied ABD Pad 7. Secured with 4 Layer Compression System - Bilateral Notes silvercel Electronic Signature(s) Signed: 03/15/2018 5:07:56 PM By:  Alric Quan Entered By: Alric Quan on 03/15/2018 10:00:17 Meinhardt, [name] A. ([id number]) -------------------------------------------------------------------------------- Vitals Details Patient Name: Singley, Dior A. Date of Service: 03/15/2018 9:15 AM Medical Record Number: [id number] Patient Account Number: [id number] Date of Birth/Sex: 1971/04/14 (47 y.o. M) Treating RN: Ahmed Prima Primary Care Laray Corbit: Gaetano Net Other Clinician: Referring Marlyn Tondreau: Gaetano Net Treating Boleslaw Borghi/Extender: Tito Dine in Treatment: 11 Vital Signs Time Taken: 09:46 Temperature (F): 98.2 Height (in): 76 Pulse (bpm): 59 Weight (lbs): 379.5 Respiratory Rate (breaths/min): 18 Body Mass Index (BMI): 46.2 Blood Pressure (mmHg): 175/72 Reference Range: 80 - 120 mg / dl Electronic Signature(s) Signed: 03/15/2018 5:07:56 PM By: Alric Quan Entered By: Alric Quan on 03/15/2018 09:46:23

## 2018-03-18 NOTE — Progress Notes (Signed)
KIEFFER, BLATZ ([id number]) Visit Report for 03/15/2018 HPI Details Patient Name: [name], [name] Date of Service: 03/15/2018 9:15 AM Medical Record Number: [id number] Patient Account Number: [id number] Date of Birth/Sex: 1971/07/07 (48 y.o. M) Treating RN: Montey Hora Primary Care Provider: Gaetano Net Other Clinician: Referring Provider: Gaetano Net Treating Provider/Extender: Tito Dine in Treatment: 11 History of Present Illness HPI Description: 12/28/17; this is a now 47 year old man who is a type II diabetic. He was hospitalized from 10/01/17 through 10/19/17. He had an MSSA soft tissue and skin infection. 2 open areas on the left leg were identified he has a smaller area on the left medial calf superiorly just below the knee and a wound just above the left ankle on the posterior medial aspect. I think both of these were surgical IandD sites when he was in the hospital. He was discharged with a wound VAC at that point however this is since been taken off. He follows with Dr. Ola Spurr for the [facility] and he is still on chronic Keflex at 500 twice a day. At that time he was hospitalized his hemoglobin A1c was 15.1 however if I'm reading his endocrinologist notes correctly that is improved. He has been following with Dr. [name] at [facility] and he has been applying calcium alginate and Unna boots. He has home health changing the dressing. They have also been attempting to get him external compression pumps although the patient is unaware whether they've been approved by insurance at this point. as mentioned he has a smaller clean wound on the right lateral calf just below the knee and he has a much larger area just above the left ankle medially and posteriorly. Our intake nurse reported greenish purulent looking drainage.the patient did have surgical material sent to pathology in February. This showed chronic abscess The patient also has lymphedema stage III in the left  greater than right lower extremities. He has a history of blisters with wounds but these of all were always healed. The patient thinks that the lymphedema may have been present since he was about 47 years old i.e. about 30 years. He does not have graded pressure stockings and has not worn stockings. He does not have a distant history of DVT PE or phlebitis. He has not been systemically unwell fever no chills. He states that his Lasix is recently been reduced. He tells me his kidney function is at "30%" and he has been followed by Dr. [name] of nephrology. At one point he was on Lasix 80 twice a day however that's been cut back and he is now on Lasix at 20 twice a day. The patient has a history of PAD listed in his records although he comes from Dr. [name] I don't think is felt to have significant PAD. ABIs in our clinic were noncompressible bilaterally. 01/04/18; patient has a large wound on the left lateral lower calf and a small wound on the left medial upper calf. He has been to see his nephrologist who changed him to Demadex 40 mg a day. I'm hopeful this will help with his systemic fluid overload. He also has stage III lymphedema. Really no change in the 2 wounds since last week 01/11/18; the patient is down 13 pounds. He put his stage III lymphedema left leg in 4K compression last week and there is less edema fluid however we still haven't been able to communicate with home health but apparently it is [facility] but the dressings have not been changed. The  patient noted an odor last week. He is also had compression pumps ordered by [facility] this as a not completed the paperwork stage. 01/18/18; patient continues to lose weight. Stage III lymphedema in the left greater than right leg under for alert compression. The major wound is on the left lateral ankle area. He apparently has bilateral compression pumps being brought to his house, these were ordered by [facility] Notable for the fact today he had some blisters on the right anterior leg together with some skin nodules. This is no doubt secondary to severe lymphedema. 01/25/18; the patient has obtained his compression pumps and is using them per vein and vascular instructions 3 times a day for an hour. He also saw [name] of vein and vascular. He was felt to have venous insufficiency but did not suggest any intervention also improved edema. It was suggested that he have compression stockings 20-30 mm on a daily basis in addition to compression pumps. Mundt, Navon A. ([id number]) The patient arrives in clinic today with a layer of unna under for layer compression. he seems to have some trouble with the degree of compression. He has open areas on the left lateral ankle area which is his major wound left upper medial calf and a superficial open area on the right anterior shin area which was blistered last week. He has skin changes on the right anterior calf which I think are no doubt secondary to lymphedema skin nodules etc. 02/01/18; the patient comes in telling us his nephrologist have to his torsemide. Unfortunately today he is put on 7 pounds by our scales. He has blisters all over the anterior and medial part of his right calf and a new open wound. He also has soupy green drainage coming out of the left lateral calf /ankle wound. 02/08/18; culture I did last week of the left lateral ankle wound grew both Pseudomonas and Morganella. He is on Keflex from Dr. Ola Spurr in the hospital. I will need to review these notes.in any case Keflex is not going to cover these 2 organisms. I'm probably going to added ciprofloxacin today for 1 week. A lot of drainage that looks purulent last week. He is not complaining of pain however he has managed to put on 10 pounds in 2 weeks by our scales in this clinic. He is going to see his nephrologist tomorrow 02/15/18; he completed the ciprofloxacin I gave him last week.  Notable that he is up to 379 pounds today which is up 16 pounds from 2 weeks ago. He is complaining of orthopnea but doesn't have any chest pain. 02/22/18; he continues to have weight gain. R intake nurse reports again purulent green drainage coming out of the lateral wound on the lateral left calf. He has small open area on the right anterior leg. His torsemide was increased to 2 tablets a day I believe this is 40 mg last week in response to the call admitted to Dr. Keturah Barre office. He follows up with Dr. [name] and Dr. Delana Meyer tomorrow 03/01/18 his weight essentially stable today at 383 pounds. Drainage out of the left lateral wound on the lower left calf/ankle is a lot less. Culture last time grew Pseudomonas. I put him on cefdinir. He has been to Dr. Keturah Barre office no adjustments in his diuretics. Dr. Nicoletta Dress prescribed a wraparound stocking for the right leg.there is no open area on the right leg. He has a superficial area on the left medial calf, left posterior  calf and in the large area on the left lateral however this looks better 03/15/18; weight is not up to 393 pounds. He saw his nephrologist yesterday Dr. [name] will increase the Demadex I'm hopeful this will help with the edema control. I'm using silver alginate to all his wounds. In particular the left lateral ankle looks better. oUnfortunately he has new open areas on the right lateral calf that will include use of his compression stocking at least in the short to medium term. He has new wounds o3 on the right lateral calf. One of these has some size however all numerous superficial Electronic Signature(s) Signed: 03/17/2018 7:56:50 AM By: Linton Ham MD Entered By: Linton Ham on 03/15/2018 10:56:08 [name], [name] ([id number]) -------------------------------------------------------------------------------- Physical Exam Details Patient Name: Marcussen, Demarus A. Date of Service: 03/15/2018 9:15 AM Medical Record Number:  [id number] Patient Account Number: [id number] Date of Birth/Sex: 30-Apr-1971 (47 y.o. M) Treating RN: Montey Hora Primary Care Provider: Gaetano Net Other Clinician: Referring Provider: Gaetano Net Treating Provider/Extender: Tito Dine in Treatment: 11 Constitutional Patient is hypertensive.. Pulse regular and within target range for patient.[name] Respirations regular, non-labored and within target range.. Temperature is normal and within the target range for the patient.[name] appears in no distress. Respiratory Respiratory effort is easy and symmetric bilaterally. Rate is normal at rest and on room air.. Bilateral breath sounds are clear and equal in all lobes with no wheezes, rales or rhonchi.. Cardiovascular Heart rhythm and rate regular, without murmur or gallop.JVP is borderline no S3. Pedal pulses palpable and strong bilaterally.. severe lymphedema in the left greater than right leg. There is a lot more edema in the right leg this week probably secondary to the compression stocking and fluid volume overload. Integumentary (Hair, Skin) lymphedema left greater than right. This is not a new finding. Psychiatric No evidence of depression, anxiety, or agitation. Calm, cooperative, and communicative. Appropriate interactions and affect.. Notes wound exam; oHe has his large wound on the left lateral calf which looks somewhat better. Superficial wounds on the left medial upper calf oNew wounds on the right lateral calf as described. The larger one is posterior. These are superficial wounds Electronic Signature(s) Signed: 03/17/2018 7:56:50 AM By: Linton Ham MD Entered By: Linton Ham on 03/15/2018 11:00:25 [name], [name] Kitchen ([id number]) -------------------------------------------------------------------------------- Physician Orders Details Patient Name: Else, Ravinder A. Date of Service: 03/15/2018 9:15 AM Medical Record Number: [id number] Patient Account Number:  [id number] Date of Birth/Sex: 1971-06-19 (47 y.o. M) Treating RN: Roger Shelter Primary Care Provider: Gaetano Net Other Clinician: Referring Provider: Gaetano Net Treating Provider/Extender: Tito Dine in Treatment: 70 Verbal / Phone Orders: No Diagnosis Coding Wound Cleansing Wound #10 Right,Proximal,Medial Lower Leg o Clean wound with Normal Saline. Wound #11 Right,Distal,Medial Lower Leg o Clean wound with Normal Saline. Wound #2 Left,Lateral,Posterior Lower Leg o Clean wound with Normal Saline. Wound #7 Left,Proximal,Medial Lower Leg o Clean wound with Normal Saline. Wound #8 Left,Proximal,Posterior Lower Leg o Clean wound with Normal Saline. Wound #9 Right,Posterior Lower Leg o Clean wound with Normal Saline. Anesthetic (add to Medication List) Wound #2 Left,Lateral,Posterior Lower Leg o Topical Lidocaine 4% cream applied to wound bed prior to debridement (In Clinic Only). Wound #7 Left,Proximal,Medial Lower Leg o Topical Lidocaine 4% cream applied to wound bed prior to debridement (In Clinic Only). Wound #8 Left,Proximal,Posterior Lower Leg o Topical Lidocaine 4% cream applied to wound bed prior to debridement (In Clinic Only). Skin Barriers/Peri-Wound Care Wound #10 Right,Proximal,Medial Lower Leg o Moisturizing lotion Wound #  11 Right,Distal,Medial Lower Leg o Moisturizing lotion Wound #2 Left,Lateral,Posterior Lower Leg o Moisturizing lotion Wound #7 Left,Proximal,Medial Lower Leg o Moisturizing lotion Wound #8 Left,Proximal,Posterior Lower Leg o Moisturizing lotion [name], [name] ([id number]) Wound #9 Right,Posterior Lower Leg o Moisturizing lotion Primary Wound Dressing Wound #10 Right,Proximal,Medial Lower Leg o Silver Alginate Wound #11 Right,Distal,Medial Lower Leg o Silver Alginate Wound #2 Left,Lateral,Posterior Lower Leg o Silver Alginate Wound #7 Left,Proximal,Medial Lower Leg o Silver  Alginate Wound #8 Left,Proximal,Posterior Lower Leg o Silver Alginate Wound #9 Right,Posterior Lower Leg o Silver Alginate Secondary Dressing Wound #10 Right,Proximal,Medial Lower Leg o ABD pad o XtraSorb Wound #11 Right,Distal,Medial Lower Leg o ABD pad o XtraSorb Wound #2 Left,Lateral,Posterior Lower Leg o ABD pad o XtraSorb Wound #7 Left,Proximal,Medial Lower Leg o ABD pad o XtraSorb Wound #8 Left,Proximal,Posterior Lower Leg o ABD pad o XtraSorb Wound #9 Right,Posterior Lower Leg o ABD pad o XtraSorb Dressing Change Frequency Wound #10 Right,Proximal,Medial Lower Leg o Change Dressing Monday, Wednesday, Friday - Wednesday in clinic. Wound #11 Right,Distal,Medial Lower Leg o Change Dressing Monday, Wednesday, Friday - Wednesday in clinic. Wound #2 Left,Lateral,Posterior Lower Leg [name], [name] ([id number]) o Change Dressing Monday, Wednesday, Friday - Wednesday in clinic. Wound #7 Left,Proximal,Medial Lower Leg o Change Dressing Monday, Wednesday, Friday - Wednesday in clinic. Wound #8 Left,Proximal,Posterior Lower Leg o Change Dressing Monday, Wednesday, Friday - Wednesday in clinic. Wound #9 Right,Posterior Lower Leg o Change Dressing Monday, Wednesday, Friday - Wednesday in clinic. Follow-up Appointments Wound #10 Right,Proximal,Medial Lower Leg o Return Appointment in 1 week. Wound #11 Right,Distal,Medial Lower Leg o Return Appointment in 1 week. Wound #2 Left,Lateral,Posterior Lower Leg o Return Appointment in 1 week. Wound #7 Left,Proximal,Medial Lower Leg o Return Appointment in 1 week. Wound #8 Left,Proximal,Posterior Lower Leg o Return Appointment in 1 week. Wound #9 Right,Posterior Lower Leg o Return Appointment in 1 week. Edema Control Wound #10 Right,Proximal,Medial Lower Leg o 4 Layer Compression System - Bilateral o Elevate legs to the level of the heart and pump ankles as often as  possible o Compression Pump: Use compression pump on left lower extremity for 30 minutes, twice daily. - 1 hour twice daily o Compression Pump: Use compression pump on right lower extremity for 30 minutes, twice daily. - 1 hour twice daily Wound #11 Right,Distal,Medial Lower Leg o 4 Layer Compression System - Bilateral o Elevate legs to the level of the heart and pump ankles as often as possible o Compression Pump: Use compression pump on left lower extremity for 30 minutes, twice daily. - 1 hour twice daily o Compression Pump: Use compression pump on right lower extremity for 30 minutes, twice daily. - 1 hour twice daily Wound #2 Left,Lateral,Posterior Lower Leg o 4 Layer Compression System - Bilateral o Elevate legs to the level of the heart and pump ankles as often as possible o Compression Pump: Use compression pump on left lower extremity for 30 minutes, twice daily. - 1 hour twice daily o Compression Pump: Use compression pump on right lower extremity for 30 minutes, twice daily. - 1 hour twice daily Wound #7 Left,Proximal,Medial Lower Leg o 4 Layer Compression System - Bilateral [name], [name] ([id number]) o Elevate legs to the level of the heart and pump ankles as often as possible o Compression Pump: Use compression pump on left lower extremity for 30 minutes, twice daily. - 1 hour twice daily o Compression Pump: Use compression pump on right lower extremity for 30 minutes, twice daily. - 1 hour twice  daily Wound #8 Left,Proximal,Posterior Lower Leg o 4 Layer Compression System - Bilateral o Elevate legs to the level of the heart and pump ankles as often as possible o Compression Pump: Use compression pump on left lower extremity for 30 minutes, twice daily. - 1 hour twice daily o Compression Pump: Use compression pump on right lower extremity for 30 minutes, twice daily. - 1 hour twice daily Wound #9 Right,Posterior Lower Leg o 4  Layer Compression System - Bilateral o Elevate legs to the level of the heart and pump ankles as often as possible o Compression Pump: Use compression pump on left lower extremity for 30 minutes, twice daily. - 1 hour twice daily o Compression Pump: Use compression pump on right lower extremity for 30 minutes, twice daily. - 1 hour twice daily Home Health Wound #10 Right,Proximal,Medial Lower Leg o [place] Visits - Monday and Friday o Home Health Nurse may visit PRN to address patientos wound care needs. o FACE TO FACE ENCOUNTER: MEDICARE and MEDICAID PATIENTS: I certify that this patient is under my care and that I had a face-to-face encounter that meets the physician face-to-face encounter requirements with this patient on this date. The encounter with the patient was in whole or in part for the following MEDICAL CONDITION: (primary reason for [place]) MEDICAL NECESSITY: I certify, that based on my findings, NURSING services are a medically necessary home health service. HOME BOUND STATUS: I certify that my clinical findings support that this patient is homebound (i.e., Due to illness or injury, pt requires aid of supportive devices such as crutches, cane, wheelchairs, walkers, the use of special transportation or the assistance of another person to leave their place of residence. There is a normal inability to leave the home and doing so requires considerable and taxing effort. Other absences are for medical reasons / religious services and are infrequent or of short duration when for other reasons). o If current dressing causes regression in wound condition, may D/C ordered dressing product/s and apply Normal Saline Moist Dressing daily until next [place] / Other MD appointment. [place] of regression in wound condition at [phone]. Wound #11 Right,Distal,Medial Lower Leg o Continue Home Health Visits - Monday and  Friday o Home Health Nurse may visit PRN to address patientos wound care needs. o FACE TO FACE ENCOUNTER: MEDICARE and MEDICAID PATIENTS: I certify that this patient is under my care and that I had a face-to-face encounter that meets the physician face-to-face encounter requirements with this patient on this date. The encounter with the patient was in whole or in part for the following MEDICAL CONDITION: (primary reason for [place]) MEDICAL NECESSITY: I certify, that based on my findings, NURSING services are a medically necessary home health service. HOME BOUND STATUS: I certify that my clinical findings support that this patient is homebound (i.e., Due to illness or injury, pt requires aid of supportive devices such as crutches, cane, wheelchairs, walkers, the use of special transportation or the assistance of another person to leave their place of residence. There is a normal inability to leave the home and doing so requires considerable and taxing effort. Other absences are for medical reasons / religious services and are infrequent or of short duration when for other reasons). o If current dressing causes regression in wound condition, may D/C ordered dressing product/s and apply Normal Saline Moist Dressing daily until next [place] / Other MD appointment. [place] of regression  in wound condition at [phone]. Wound #2 Left,Lateral,Posterior Lower Leg [name], [name] ([id number]) o [place] Visits - Monday and Friday o Home Health Nurse may visit PRN to address patientos wound care needs. o FACE TO FACE ENCOUNTER: MEDICARE and MEDICAID PATIENTS: I certify that this patient is under my care and that I had a face-to-face encounter that meets the physician face-to-face encounter requirements with this patient on this date. The encounter with the patient was in whole or in part for the following MEDICAL CONDITION: (primary  reason for [place]) MEDICAL NECESSITY: I certify, that based on my findings, NURSING services are a medically necessary home health service. HOME BOUND STATUS: I certify that my clinical findings support that this patient is homebound (i.e., Due to illness or injury, pt requires aid of supportive devices such as crutches, cane, wheelchairs, walkers, the use of special transportation or the assistance of another person to leave their place of residence. There is a normal inability to leave the home and doing so requires considerable and taxing effort. Other absences are for medical reasons / religious services and are infrequent or of short duration when for other reasons). o If current dressing causes regression in wound condition, may D/C ordered dressing product/s and apply Normal Saline Moist Dressing daily until next [place] / Other MD appointment. [place] of regression in wound condition at [phone]. Wound #7 Left,Proximal,Medial Lower Leg o Continue Home Health Visits - Monday and Friday o Home Health Nurse may visit PRN to address patientos wound care needs. o FACE TO FACE ENCOUNTER: MEDICARE and MEDICAID PATIENTS: I certify that this patient is under my care and that I had a face-to-face encounter that meets the physician face-to-face encounter requirements with this patient on this date. The encounter with the patient was in whole or in part for the following MEDICAL CONDITION: (primary reason for [place]) MEDICAL NECESSITY: I certify, that based on my findings, NURSING services are a medically necessary home health service. HOME BOUND STATUS: I certify that my clinical findings support that this patient is homebound (i.e., Due to illness or injury, pt requires aid of supportive devices such as crutches, cane, wheelchairs, walkers, the use of special transportation or the assistance of another person to leave their place of  residence. There is a normal inability to leave the home and doing so requires considerable and taxing effort. Other absences are for medical reasons / religious services and are infrequent or of short duration when for other reasons). o If current dressing causes regression in wound condition, may D/C ordered dressing product/s and apply Normal Saline Moist Dressing daily until next [place] / Other MD appointment. [place] of regression in wound condition at [phone]. Wound #8 Left,Proximal,Posterior Lower Leg o [place] Visits - Monday and Friday o Home Health Nurse may visit PRN to address patientos wound care needs. o FACE TO FACE ENCOUNTER: MEDICARE and MEDICAID PATIENTS: I certify that this patient is under my care and that I had a face-to-face encounter that meets the physician face-to-face encounter requirements with this patient on this date. The encounter with the patient was in whole or in part for the following MEDICAL CONDITION: (primary reason for [place]) MEDICAL NECESSITY: I certify, that based on my findings, NURSING services are a medically necessary home health service. HOME BOUND STATUS: I certify that my clinical findings support that this patient is homebound (i.e., Due to illness or injury,  pt requires aid of supportive devices such as crutches, cane, wheelchairs, walkers, the use of special transportation or the assistance of another person to leave their place of residence. There is a normal inability to leave the home and doing so requires considerable and taxing effort. Other absences are for medical reasons / religious services and are infrequent or of short duration when for other reasons). o If current dressing causes regression in wound condition, may D/C ordered dressing product/s and apply Normal Saline Moist Dressing daily until next [place] / Other MD appointment. [place] of regression in wound condition at [phone]. Wound #9 Right,Posterior Lower Leg o [place] Visits - Monday and Friday o Home Health Nurse may visit PRN to address patientos wound care needs. o FACE TO FACE ENCOUNTER: MEDICARE and MEDICAID PATIENTS: I certify that this patient is under my care and that I had a face-to-face encounter that meets the physician face-to-face encounter requirements with this patient on this date. The encounter with the patient was in whole or in part for the following MEDICAL CONDITION: (primary reason for [place]) MEDICAL NECESSITY: I certify, that based on my findings, NURSING services are a medically necessary home health service. HOME BOUND STATUS: I certify that my clinical findings support that this patient is homebound (i.e., Due to illness or injury, pt requires aid of Stebner, Zyier A. ([id number]) supportive devices such as crutches, cane, wheelchairs, walkers, the use of special transportation or the assistance of another person to leave their place of residence. There is a normal inability to leave the home and doing so requires considerable and taxing effort. Other absences are for medical reasons / religious services and are infrequent or of short duration when for other reasons). o If current dressing causes regression in wound condition, may D/C ordered dressing product/s and apply Normal Saline Moist Dressing daily until next [place] / Other MD appointment. [place] of regression in wound condition at [phone]. Electronic Signature(s) Signed: 03/15/2018 4:58:57 PM By: Roger Shelter Signed: 03/17/2018 7:56:50 AM By: Linton Ham MD Entered By: Roger Shelter on 03/15/2018 10:16:40 Ulbricht, Justus A. ([id number]) -------------------------------------------------------------------------------- Problem List Details Patient Name: [name], [name] Date of Service:  03/15/2018 9:15 AM Medical Record Number: [id number] Patient Account Number: [id number] Date of Birth/Sex: Jul 14, 1971 (47 y.o. 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 continue silver Decision alternate under Making : compression I89.0 Lymphedema, not elsewhere classified 12/28/2017 Yes Yes Status Complications Interventions No unfortunately his weight is gone off to 393 pounds today. He unfortunately he has change saw's nephrologist yesterday who increased his Demadex Open wounds on the to 2 pills in the morning and 1 at night. Primary issue here is right lateral calf. We lymphedema and I think secondary issue is severe stage IV put him in his Medical chronic renal failure stocking last week. Decision He is going to need Making : to go back to compression. 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 The largest one is posterior/lateral compression today Medical to replacehis Decision compression wrap Making : that I put him in last week Halliday, Chukwudi A. ([id number]) Inactive Problems Resolved Problems Electronic Signature(s) Signed: 03/17/2018 7:56:50  AM By: Linton Ham MD Entered By: Linton Ham on 03/15/2018 10:54:15 [name], [name] ([id number]) -------------------------------------------------------------------------------- Progress Note Details Patient Name: [name], [name] Date of Service: 03/15/2018 9:15 AM Medical Record Number: [id number] Patient Account Number: [id number] Date of Birth/Sex: October 29, 1970 (47 y.o. M) Treating RN: Montey Hora Primary Care Provider: Gaetano Net Other Clinician: Referring Provider: Gaetano Net Treating Provider/Extender: Tito Dine in Treatment: 11 Subjective History of Present Illness (HPI) 12/28/17; this is a now 47 year old man who is a type II diabetic. He was hospitalized from 10/01/17 through 10/19/17. He had an MSSA soft tissue and skin infection. 2 open areas on the left leg were identified he has a smaller area on the left medial calf superiorly just below the knee and a wound just above the left ankle on the posterior medial aspect. I think both of these were surgical IandD sites when he was in the hospital. He was discharged with a wound VAC at that point however this is since been taken off. He follows with Dr. Ola Spurr for the [facility] and he is still on chronic Keflex at 500 twice a day. At that time he was hospitalized his hemoglobin A1c was 15.1 however if I'm reading his endocrinologist notes correctly that is improved. He has been following with Dr. [name] at [facility] vascular and he has been applying calcium alginate and Unna boots. He has home health changing the dressing. They have also been attempting to get him external compression pumps although the patient is unaware whether they've been approved by insurance at this point. as mentioned he has a smaller clean wound on the right lateral calf just below the knee and he has a much larger area just above the left ankle medially and posteriorly. Our intake nurse reported greenish purulent looking drainage.the  patient did have surgical material sent to pathology in February. This showed chronic abscess The patient also has lymphedema stage III in the left greater than right lower extremities. He has a history of blisters with wounds but these of all were always healed. The patient thinks that the lymphedema may have been present since he was about 47 years old i.e. about 30 years. He does not have graded pressure stockings and has not worn stockings. He does not have a distant history of DVT PE or phlebitis. He has not been systemically unwell fever no chills. He states that his Lasix is recently been reduced. He tells me his kidney function is at "30%" and he has been followed by Dr. [name] of nephrology. At one point he was on Lasix 80 twice a day however that's been cut back and he is now on Lasix at 20 twice a day. The patient has a history of PAD listed in his records although he comes from Dr. [name] I don't think is felt to have significant PAD. ABIs in our clinic were noncompressible bilaterally. 01/04/18; patient has a large wound on the left lateral lower calf and a small wound on the left medial upper calf. He has been to see his nephrologist who changed him  to Demadex 40 mg a day. I'm hopeful this will help with his systemic fluid overload. He also has stage III lymphedema. Really no change in the 2 wounds since last week 01/11/18; the patient is down 13 pounds. He put his stage III lymphedema left leg in 4K compression last week and there is less edema fluid however we still haven't been able to communicate with home health but apparently it is [facility] but the dressings have not been changed. The patient noted an odor last week. He is also had compression pumps ordered by [facility] this as a not completed the paperwork stage. 01/18/18; patient continues to lose weight. Stage III lymphedema in the left greater than right leg under for alert compression. The major wound is on  the left lateral ankle area. He apparently has bilateral compression pumps being brought to his house, these were ordered by [facility] Notable for the fact today he had some blisters on the right anterior leg together with some skin nodules. This is no doubt secondary to severe lymphedema. 01/25/18; the patient has obtained his compression pumps and is using them per vein and vascular instructions 3 times a day for an hour. He also saw [name] of vein and vascular. He was felt to have venous insufficiency but did not suggest any intervention also improved edema. It was suggested that he have compression stockings 20-30 mm on a daily basis in addition to compression pumps. The patient arrives in clinic today with a layer of unna under for layer compression. he seems to have some trouble with the degree of compression. He has open areas on the left lateral ankle area which is his major wound left upper medial calf and a Popovich, Jamai A. ([id number]) superficial open area on the right anterior shin area which was blistered last week. He has skin changes on the right anterior calf which I think are no doubt secondary to lymphedema skin nodules etc. 02/01/18; the patient comes in telling us his nephrologist have to his torsemide. Unfortunately today he is put on 7 pounds by our scales. He has blisters all over the anterior and medial part of his right calf and a new open wound. He also has soupy green drainage coming out of the left lateral calf /ankle wound. 02/08/18; culture I did last week of the left lateral ankle wound grew both Pseudomonas and Morganella. He is on Keflex from Dr. Ola Spurr in the hospital. I will need to review these notes.in any case Keflex is not going to cover these 2 organisms. I'm probably going to added ciprofloxacin today for 1 week. A lot of drainage that looks purulent last week. He is not complaining of pain however he has managed to put on 10 pounds in 2  weeks by our scales in this clinic. He is going to see his nephrologist tomorrow 02/15/18; he completed the ciprofloxacin I gave him last week. Notable that he is up to 379 pounds today which is up 16 pounds from 2 weeks ago. He is complaining of orthopnea but doesn't have any chest pain. 02/22/18; he continues to have weight gain. R intake nurse reports again purulent green drainage coming out of the lateral wound on the lateral left calf. He has small open area on the right anterior leg. His torsemide was increased to 2 tablets a day I believe this is 40 mg last week in response to the call admitted to Dr. Keturah Barre office. He follows up with Dr.  [name] and Dr. Delana Meyer tomorrow 03/01/18 his weight essentially stable today at 383 pounds. Drainage out of the left lateral wound on the lower left calf/ankle is a lot less. Culture last time grew Pseudomonas. I put him on cefdinir. He has been to Dr. Keturah Barre office no adjustments in his diuretics. Dr. Nicoletta Dress prescribed a wraparound stocking for the right leg.there is no open area on the right leg. He has a superficial area on the left medial calf, left posterior calf and in the large area on the left lateral however this looks better 03/15/18; weight is not up to 393 pounds. He saw his nephrologist yesterday Dr. [name] will increase the Demadex I'm hopeful this will help with the edema control. I'm using silver alginate to all his wounds. In particular the left lateral ankle looks better. Unfortunately he has new open areas on the right lateral calf that will include use of his compression stocking at least in the short to medium term. He has new wounds o3 on the right lateral calf. One of these has some size however all numerous superficial Objective Constitutional Patient is hypertensive.. Pulse regular and within target range for patient.[name] Respirations regular, non-labored and within target range.. Temperature is normal and within the target range for  the patient.[name] appears in no distress. Vitals Time Taken: 9:46 AM, Height: 76 in, Weight: 379.5 lbs, BMI: 46.2, Temperature: 98.2 F, Pulse: 59 bpm, Respiratory Rate: 18 breaths/min, Blood Pressure: 175/72 mmHg. Respiratory Respiratory effort is easy and symmetric bilaterally. Rate is normal at rest and on room air.. Bilateral breath sounds are clear and equal in all lobes with no wheezes, rales or rhonchi.. Cardiovascular Heart rhythm and rate regular, without murmur or gallop.JVP is borderline no S3. Pedal pulses palpable and strong bilaterally.. severe lymphedema in the left greater than right leg. There is a lot more edema in the right leg this week probably secondary to the compression stocking and fluid volume overload. Psychiatric No evidence of depression, anxiety, or agitation. Calm, cooperative, and communicative. Appropriate interactions and affect.. General Notes: wound exam; He has his large wound on the left lateral calf which looks somewhat better. Superficial wounds on the left medial upper calf New wounds on the right lateral calf as described. The larger one is posterior. These Oren, Reg A. ([id number]) are superficial wounds Integumentary (Hair, Skin) lymphedema left greater than right. This is not a new finding. Wound #10 status is Open. Original cause of wound was Gradually Appeared. The wound is located on the Right,Proximal,Medial Lower Leg. The wound measures 1.8cm length x 1.2cm width x 0.1cm depth; 1.696cm^2 area and 0.17cm^3 volume. There is no tunneling or undermining noted. There is a large amount of serous drainage noted. The wound margin is distinct with the outline attached to the wound base. There is no granulation within the wound bed. There is a large (67-100%) amount of necrotic tissue within the wound bed including Adherent Slough. Periwound temperature was noted as No Abnormality. The periwound has tenderness on palpation. Wound #11 status is Open.  Original cause of wound was Gradually Appeared. The wound is located on the Right,Distal,Medial Lower Leg. The wound measures 1cm length x 1.5cm width x 0.1cm depth; 1.178cm^2 area and 0.118cm^3 volume. There is no tunneling or undermining noted. There is a large amount of serous drainage noted. The wound margin is distinct with the outline attached to the wound base. There is no granulation within the wound bed. There is a large (67-100%) amount of necrotic tissue within  the wound bed including Adherent Slough. Periwound temperature was noted as No Abnormality. The periwound has tenderness on palpation. Wound #2 status is Open. Original cause of wound was Gradually Appeared. The wound is located on the Left,Lateral,Posterior Lower Leg. The wound measures 2.2cm length x 4.7cm width x 1cm depth; 8.121cm^2 area and 8.121cm^3 volume. There is no tunneling noted, however, there is undermining starting at 11:00 and ending at 12:00 with a maximum distance of 0.7cm. There is a large amount of serosanguineous drainage noted. The wound margin is distinct with the outline attached to the wound base. There is medium (34-66%) red granulation within the wound bed. There is a medium (34-66%) amount of necrotic tissue within the wound bed including Adherent Slough. The periwound skin appearance exhibited: Maceration. Periwound temperature was noted as No Abnormality. The periwound has tenderness on palpation. Wound #7 status is Open. Original cause of wound was Gradually Appeared. The wound is located on the Left,Proximal,Medial Lower Leg. The wound measures 1.8cm length x 4.5cm width x 0.1cm depth; 6.362cm^2 area and 0.636cm^3 volume. There is Fat Layer (Subcutaneous Tissue) Exposed exposed. There is no tunneling or undermining noted. There is a large amount of serosanguineous drainage noted. The wound margin is flat and intact. There is large (67- 100%) red granulation within the wound bed. There is a small  (1-33%) amount of necrotic tissue within the wound bed including Eschar. The periwound skin appearance did not exhibit: Callus, Crepitus, Excoriation, Induration, Rash, Scarring, Dry/Scaly, Maceration, Atrophie Blanche, Cyanosis, Ecchymosis, Hemosiderin Staining, Mottled, Pallor, Rubor, Erythema. Periwound temperature was noted as No Abnormality. The periwound has tenderness on palpation. Wound #8 status is Open. Original cause of wound was Gradually Appeared. The wound is located on the Left,Proximal,Posterior Lower Leg. The wound measures 1cm length x 0.8cm width x 0.2cm depth; 0.628cm^2 area and 0.126cm^3 volume. There is Fat Layer (Subcutaneous Tissue) Exposed exposed. There is no tunneling or undermining noted. There is a large amount of serosanguineous drainage noted. The wound margin is distinct with the outline attached to the wound base. There is medium (34-66%) red granulation within the wound bed. There is a medium (34-66%) amount of necrotic tissue within the wound bed including Eschar and Adherent Slough. The periwound skin appearance exhibited: Dry/Scaly. The periwound skin appearance did not exhibit: Callus, Crepitus, Excoriation, Induration, Rash, Scarring, Maceration, Atrophie Blanche, Cyanosis, Ecchymosis, Hemosiderin Staining, Mottled, Pallor, Rubor, Erythema. Wound #9 status is Open. Original cause of wound was Gradually Appeared. The wound is located on the Right,Posterior Lower Leg. The wound measures 3.5cm length x 3.8cm width x 0.1cm depth; 10.446cm^2 area and 1.045cm^3 volume. There is no tunneling or undermining noted. There is a large amount of serous drainage noted. The wound margin is distinct with the outline attached to the wound base. There is large (67-100%) pink granulation within the wound bed. There is a small (1-33%) amount of necrotic tissue within the wound bed including Adherent Slough. Periwound temperature was noted as No Abnormality. The periwound has  tenderness on palpation. Assessment Active Problems Lei, Fredderick A. ([id number]) ICD-10 Non-pressure chronic ulcer of left calf with necrosis of muscle Lymphedema, not elsewhere classified Chronic venous hypertension (idiopathic) with ulcer and inflammation of left lower extremity Type 2 diabetes mellitus with other skin ulcer Non-pressure chronic ulcer of right calf limited to breakdown of skin Plan Wound Cleansing: Wound #10 Right,Proximal,Medial Lower Leg: Clean wound with Normal Saline. Wound #11 Right,Distal,Medial Lower Leg: Clean wound with Normal Saline. Wound #2 Left,Lateral,Posterior Lower Leg: Clean wound with  Normal Saline. Wound #7 Left,Proximal,Medial Lower Leg: Clean wound with Normal Saline. Wound #8 Left,Proximal,Posterior Lower Leg: Clean wound with Normal Saline. Wound #9 Right,Posterior Lower Leg: Clean wound with Normal Saline. Anesthetic (add to Medication List): Wound #2 Left,Lateral,Posterior Lower Leg: Topical Lidocaine 4% cream applied to wound bed prior to debridement (In Clinic Only). Wound #7 Left,Proximal,Medial Lower Leg: Topical Lidocaine 4% cream applied to wound bed prior to debridement (In Clinic Only). Wound #8 Left,Proximal,Posterior Lower Leg: Topical Lidocaine 4% cream applied to wound bed prior to debridement (In Clinic Only). Skin Barriers/Peri-Wound Care: Wound #10 Right,Proximal,Medial Lower Leg: Moisturizing lotion Wound #11 Right,Distal,Medial Lower Leg: Moisturizing lotion Wound #2 Left,Lateral,Posterior Lower Leg: Moisturizing lotion Wound #7 Left,Proximal,Medial Lower Leg: Moisturizing lotion Wound #8 Left,Proximal,Posterior Lower Leg: Moisturizing lotion Wound #9 Right,Posterior Lower Leg: Moisturizing lotion Primary Wound Dressing: Wound #10 Right,Proximal,Medial Lower Leg: Silver Alginate Wound #11 Right,Distal,Medial Lower Leg: Silver Alginate Wound #2 Left,Lateral,Posterior Lower Leg: Silver Alginate Wound #7  Left,Proximal,Medial Lower Leg: Silver Alginate Wound #8 Left,Proximal,Posterior Lower Leg: Silver Alginate Wound #9 Right,Posterior Lower Leg: Cutler, Mikhael A. ([id number]) Silver Alginate Secondary Dressing: Wound #10 Right,Proximal,Medial Lower Leg: ABD pad XtraSorb Wound #11 Right,Distal,Medial Lower Leg: ABD pad XtraSorb Wound #2 Left,Lateral,Posterior Lower Leg: ABD pad XtraSorb Wound #7 Left,Proximal,Medial Lower Leg: ABD pad XtraSorb Wound #8 Left,Proximal,Posterior Lower Leg: ABD pad XtraSorb Wound #9 Right,Posterior Lower Leg: ABD pad XtraSorb Dressing Change Frequency: Wound #10 Right,Proximal,Medial Lower Leg: Change Dressing Monday, Wednesday, Friday - Wednesday in clinic. Wound #11 Right,Distal,Medial Lower Leg: Change Dressing Monday, Wednesday, Friday - Wednesday in clinic. Wound #2 Left,Lateral,Posterior Lower Leg: Change Dressing Monday, Wednesday, Friday - Wednesday in clinic. Wound #7 Left,Proximal,Medial Lower Leg: Change Dressing Monday, Wednesday, Friday - Wednesday in clinic. Wound #8 Left,Proximal,Posterior Lower Leg: Change Dressing Monday, Wednesday, Friday - Wednesday in clinic. Wound #9 Right,Posterior Lower Leg: Change Dressing Monday, Wednesday, Friday - Wednesday in clinic. Follow-up Appointments: Wound #10 Right,Proximal,Medial Lower Leg: Return Appointment in 1 week. Wound #11 Right,Distal,Medial Lower Leg: Return Appointment in 1 week. Wound #2 Left,Lateral,Posterior Lower Leg: Return Appointment in 1 week. Wound #7 Left,Proximal,Medial Lower Leg: Return Appointment in 1 week. Wound #8 Left,Proximal,Posterior Lower Leg: Return Appointment in 1 week. Wound #9 Right,Posterior Lower Leg: Return Appointment in 1 week. Edema Control: Wound #10 Right,Proximal,Medial Lower Leg: 4 Layer Compression System - Bilateral Elevate legs to the level of the heart and pump ankles as often as possible Compression Pump: Use compression pump on  left lower extremity for 30 minutes, twice daily. - 1 hour twice daily Compression Pump: Use compression pump on right lower extremity for 30 minutes, twice daily. - 1 hour twice daily Wound #11 Right,Distal,Medial Lower Leg: 4 Layer Compression System - Bilateral Elevate legs to the level of the heart and pump ankles as often as possible Compression Pump: Use compression pump on left lower extremity for 30 minutes, twice daily. - 1 hour twice daily Compression Pump: Use compression pump on right lower extremity for 30 minutes, twice daily. - 1 hour twice daily Wound #2 Left,Lateral,Posterior Lower Leg: 4 Layer Compression System - Bilateral Elevate legs to the level of the heart and pump ankles as often as possible [name], [name] ([id number]) Compression Pump: Use compression pump on left lower extremity for 30 minutes, twice daily. - 1 hour twice daily Compression Pump: Use compression pump on right lower extremity for 30 minutes, twice daily. - 1 hour twice daily Wound #7 Left,Proximal,Medial Lower Leg: 4 Layer Compression System - Bilateral Elevate legs  to the level of the heart and pump ankles as often as possible Compression Pump: Use compression pump on left lower extremity for 30 minutes, twice daily. - 1 hour twice daily Compression Pump: Use compression pump on right lower extremity for 30 minutes, twice daily. - 1 hour twice daily Wound #8 Left,Proximal,Posterior Lower Leg: 4 Layer Compression System - Bilateral Elevate legs to the level of the heart and pump ankles as often as possible Compression Pump: Use compression pump on left lower extremity for 30 minutes, twice daily. - 1 hour twice daily Compression Pump: Use compression pump on right lower extremity for 30 minutes, twice daily. - 1 hour twice daily Wound #9 Right,Posterior Lower Leg: 4 Layer Compression System - Bilateral Elevate legs to the level of the heart and pump ankles as often as possible Compression Pump: Use  compression pump on left lower extremity for 30 minutes, twice daily. - 1 hour twice daily Compression Pump: Use compression pump on right lower extremity for 30 minutes, twice daily. - 1 hour twice daily Home Health: Wound #10 Right,Proximal,Medial Lower Leg: Continue Home Health Visits - Monday and Friday Home Health Nurse may visit PRN to address patient s wound care needs. FACE TO FACE ENCOUNTER: MEDICARE and MEDICAID PATIENTS: I certify that this patient is under my care and that I had a face-to-face encounter that meets the physician face-to-face encounter requirements with this patient on this date. The encounter with the patient was in whole or in part for the following MEDICAL CONDITION: (primary reason for [place]) MEDICAL NECESSITY: I certify, that based on my findings, NURSING services are a medically necessary home health service. HOME BOUND STATUS: I certify that my clinical findings support that this patient is homebound (i.e., Due to illness or injury, pt requires aid of supportive devices such as crutches, cane, wheelchairs, walkers, the use of special transportation or the assistance of another person to leave their place of residence. There is a normal inability to leave the home and doing so requires considerable and taxing effort. Other absences are for medical reasons / religious services and are infrequent or of short duration when for other reasons). If current dressing causes regression in wound condition, may D/C ordered dressing product/s and apply Normal Saline Moist Dressing daily until next [place] / Other MD appointment. [place] of regression in wound condition at [phone]. Wound #11 Right,Distal,Medial Lower Leg: Continue Home Health Visits - Monday and Friday Home Health Nurse may visit PRN to address patient s wound care needs. FACE TO FACE ENCOUNTER: MEDICARE and MEDICAID PATIENTS: I certify that this patient is  under my care and that I had a face-to-face encounter that meets the physician face-to-face encounter requirements with this patient on this date. The encounter with the patient was in whole or in part for the following MEDICAL CONDITION: (primary reason for [place]) MEDICAL NECESSITY: I certify, that based on my findings, NURSING services are a medically necessary home health service. HOME BOUND STATUS: I certify that my clinical findings support that this patient is homebound (i.e., Due to illness or injury, pt requires aid of supportive devices such as crutches, cane, wheelchairs, walkers, the use of special transportation or the assistance of another person to leave their place of residence. There is a normal inability to leave the home and doing so requires considerable and taxing effort. Other absences are for medical reasons / religious services and are infrequent or of short duration when for other  reasons). If current dressing causes regression in wound condition, may D/C ordered dressing product/s and apply Normal Saline Moist Dressing daily until next [place] / Other MD appointment. [place] of regression in wound condition at [phone]. Wound #2 Left,Lateral,Posterior Lower Leg: Continue Home Health Visits - Monday and Friday Home Health Nurse may visit PRN to address patient s wound care needs. FACE TO FACE ENCOUNTER: MEDICARE and MEDICAID PATIENTS: I certify that this patient is under my care and that I had a face-to-face encounter that meets the physician face-to-face encounter requirements with this patient on this date. The encounter with the patient was in whole or in part for the following MEDICAL CONDITION: (primary reason for [place]) MEDICAL NECESSITY: I certify, that based on my findings, NURSING services are a medically necessary home health service. HOME BOUND STATUS: I certify that my clinical findings support that this  patient is homebound (i.e., Due to illness or injury, pt requires aid of supportive devices such as crutches, cane, wheelchairs, walkers, the use of special transportation or the assistance of another person to leave their place of residence. There is a normal inability to leave the home and doing so requires considerable and taxing effort. Other absences are for medical reasons / religious services and are infrequent or of short duration when for other reasons). [name], [name] ([id number]) If current dressing causes regression in wound condition, may D/C ordered dressing product/s and apply Normal Saline Moist Dressing daily until next [place] / Other MD appointment. [place] of regression in wound condition at [phone]. Wound #7 Left,Proximal,Medial Lower Leg: Continue Home Health Visits - Monday and Friday Home Health Nurse may visit PRN to address patient s wound care needs. FACE TO FACE ENCOUNTER: MEDICARE and MEDICAID PATIENTS: I certify that this patient is under my care and that I had a face-to-face encounter that meets the physician face-to-face encounter requirements with this patient on this date. The encounter with the patient was in whole or in part for the following MEDICAL CONDITION: (primary reason for [place]) MEDICAL NECESSITY: I certify, that based on my findings, NURSING services are a medically necessary home health service. HOME BOUND STATUS: I certify that my clinical findings support that this patient is homebound (i.e., Due to illness or injury, pt requires aid of supportive devices such as crutches, cane, wheelchairs, walkers, the use of special transportation or the assistance of another person to leave their place of residence. There is a normal inability to leave the home and doing so requires considerable and taxing effort. Other absences are for medical reasons / religious services and are infrequent or of short duration  when for other reasons). If current dressing causes regression in wound condition, may D/C ordered dressing product/s and apply Normal Saline Moist Dressing daily until next [place] / Other MD appointment. [place] of regression in wound condition at [phone]. Wound #8 Left,Proximal,Posterior Lower Leg: Continue Home Health Visits - Monday and Friday Home Health Nurse may visit PRN to address patient s wound care needs. FACE TO FACE ENCOUNTER: MEDICARE and MEDICAID PATIENTS: I certify that this patient is under my care and that I had a face-to-face encounter that meets the physician face-to-face encounter requirements with this patient on this date. The encounter with the patient was in whole or in part for the following MEDICAL CONDITION: (primary reason for [place]) MEDICAL NECESSITY: I certify, that based on my findings, NURSING services  are a medically necessary home health service. HOME BOUND STATUS: I certify that my clinical findings support that this patient is homebound (i.e., Due to illness or injury, pt requires aid of supportive devices such as crutches, cane, wheelchairs, walkers, the use of special transportation or the assistance of another person to leave their place of residence. There is a normal inability to leave the home and doing so requires considerable and taxing effort. Other absences are for medical reasons / religious services and are infrequent or of short duration when for other reasons). If current dressing causes regression in wound condition, may D/C ordered dressing product/s and apply Normal Saline Moist Dressing daily until next [place] / Other MD appointment. [place] of regression in wound condition at [phone]. Wound #9 Right,Posterior Lower Leg: [name] Visits - Monday and Friday Home Health Nurse may visit PRN to address patient s wound care needs. FACE TO FACE  ENCOUNTER: MEDICARE and MEDICAID PATIENTS: I certify that this patient is under my care and that I had a face-to-face encounter that meets the physician face-to-face encounter requirements with this patient on this date. The encounter with the patient was in whole or in part for the following MEDICAL CONDITION: (primary reason for [place]) MEDICAL NECESSITY: I certify, that based on my findings, NURSING services are a medically necessary home health service. HOME BOUND STATUS: I certify that my clinical findings support that this patient is homebound (i.e., Due to illness or injury, pt requires aid of supportive devices such as crutches, cane, wheelchairs, walkers, the use of special transportation or the assistance of another person to leave their place of residence. There is a normal inability to leave the home and doing so requires considerable and taxing effort. Other absences are for medical reasons / religious services and are infrequent or of short duration when for other reasons). If current dressing causes regression in wound condition, may D/C ordered dressing product/s and apply Normal Saline Moist Dressing daily until next [place] / Other MD appointment. [place] of regression in wound condition at [phone]. Medical Decision Making Non-pressure chronic ulcer of left calf with necrosis of muscle 12/28/2017 Status: Improving Complications: wound bed looks better and dimensions are better. Interventions: continue silver alternate under compression Lymphedema, not elsewhere classified 12/28/2017 Status: No change Complications: unfortunately his weight is gone off to 393 pounds today. He saw's nephrologist yesterday who increased his Demadex to 2 pills in the morning and 1 at night. Primary issue here is lymphedema and I think secondary issue is severe stage IV chronic renal failure Interventions: unfortunately he has Open wounds on the right  lateral calf. We put him in his stocking last week. He is going to need to go back to compression. Silver alginate to all wounds Vandevander, Nico A. ([id number]) Non-pressure chronic ulcer of right calf limited to breakdown of skin 02/22/2018 Status: Improving Complications: 3 open areas on the right lateral calf this week all of which are new. The largest one is posterior/lateral Interventions: I put him in 4 layer compression today to replacehis compression wrap that I put him in last week #1 I'm getting continue with silver alginate to all wounds. His major wound actually is better on the left lateral lower calf/ankle. #2 a lot more edema in the right leg is resulted in skin breakdown. I'll need to put him back in 4-layer compression. Hopefully the increase in Demadex will help with the fluid and the  weight gain #3 unfortunately this man has far too much edema to maintain skin integrity easily. He is religiously compliant with his external compression pumps at home 3 times a day. It is not going to be easy to maintain skin integrity into the future. I wonder where his nephrologist is in the preparation for dialysis if as superficially it seems this can be far off Electronic Signature(s) Signed: 03/17/2018 7:56:50 AM By: Linton Ham MD Entered By: Linton Ham on 03/15/2018 11:02:28 Vlachos, Zebedee A. ([id number]) -------------------------------------------------------------------------------- SuperBill Details Patient Name: Faul, Nickolaos A. Date of Service: 03/15/2018 Medical Record Number: [id number] Patient Account Number: [id number] Date of Birth/Sex: 01-29-1971 (47 y.o. M) Treating RN: Roger Shelter Primary Care Provider: Gaetano Net Other Clinician: Referring Provider: Gaetano Net Treating Provider/Extender: Tito Dine in Treatment: 11 Diagnosis Coding ICD-10 Codes Code Description [phone] Non-pressure chronic ulcer of left calf with necrosis of muscle I89.0  Lymphedema, not elsewhere classified I87.332 Chronic venous hypertension (idiopathic) with ulcer and inflammation of left lower extremity E11.622 Type 2 diabetes mellitus with other skin ulcer L97.211 Non-pressure chronic ulcer of right calf limited to breakdown of skin Facility Procedures CPT4 Code: 10175102 Description: [phone] - WOUND CARE VISIT-LEV 5 EST PT Modifier: Quantity: 1 Physician Procedures CPT4 Code: 7824235 Description: 99213 - WC PHYS LEVEL 3 - EST PT ICD-10 Diagnosis Description L97.223 Non-pressure chronic ulcer of left calf with necrosis of mu L97.211 Non-pressure chronic ulcer of right calf limited to breakdo I89.0 Lymphedema, not elsewhere classified  E11.622 Type 2 diabetes mellitus with other skin ulcer Modifier: scle wn of skin Quantity: 1 Electronic Signature(s) Signed: 03/17/2018 7:56:50 AM By: Linton Ham MD Entered By: Linton Ham on 03/15/2018 11:03:01

## 2018-03-22 ENCOUNTER — Encounter: Payer: BLUE CROSS/BLUE SHIELD | Admitting: Internal Medicine

## 2018-03-29 ENCOUNTER — Encounter: Payer: Self-pay | Admitting: Internal Medicine

## 2018-03-30 ENCOUNTER — Encounter (INDEPENDENT_AMBULATORY_CARE_PROVIDER_SITE_OTHER): Payer: Self-pay | Admitting: Vascular Surgery

## 2018-03-30 ENCOUNTER — Ambulatory Visit (INDEPENDENT_AMBULATORY_CARE_PROVIDER_SITE_OTHER): Payer: BLUE CROSS/BLUE SHIELD | Admitting: Vascular Surgery

## 2018-03-30 VITALS — BP 171/79 | HR 55 | Resp 18 | Ht 76.0 in | Wt >= 6400 oz

## 2018-03-30 DIAGNOSIS — I872 Venous insufficiency (chronic) (peripheral): Secondary | ICD-10-CM

## 2018-03-30 DIAGNOSIS — L97909 Non-pressure chronic ulcer of unspecified part of unspecified lower leg with unspecified severity: Secondary | ICD-10-CM

## 2018-03-30 DIAGNOSIS — E119 Type 2 diabetes mellitus without complications: Secondary | ICD-10-CM

## 2018-03-30 DIAGNOSIS — I890 Lymphedema, not elsewhere classified: Secondary | ICD-10-CM

## 2018-03-30 DIAGNOSIS — I10 Essential (primary) hypertension: Secondary | ICD-10-CM

## 2018-03-30 DIAGNOSIS — I83009 Varicose veins of unspecified lower extremity with ulcer of unspecified site: Secondary | ICD-10-CM

## 2018-03-30 NOTE — Progress Notes (Signed)
MRN : [id number]  [name] is a 47 y.o. ([birth year]) male who presents with chief complaint of  Chief Complaint  Patient presents with  . Follow-up  .  History of Present Illness:   Patient is seen for follow up evaluation of leg pain and swelling associated with venous ulceration.Since his last visit he has been treated as an outpatient with a Unna wraps and has been following with the wound clinic.The swelling and chronic lymphedema changes continue to be worse on the left legand is associated with pain and discoloration. The pain and swelling worsens with prolonged dependency and improves with elevation. The patient notes that in the morning the legs are better but the leg symptoms worsened throughout the course of the day. The patient has also noted a progressive worsening of the discoloration in the ankle and shin area.  No new ulcers have appeared.  The patient notes that has been able to tolerate the Unna boot (3 layer wrap) The patient states that they have been elevating as much as possible and has been using the lymph pump at least once a day. The patient denies any recent changes in medications.  The patient denies a history of DVT or PE. There is no prior history of phlebitis. There is no history of primary lymphedema.  No SOB or increased cough. No sputum production. No recent episodes of CHF exacerbation.    Current Meds  Medication Sig  . amLODipine (NORVASC) 10 MG tablet Take by mouth.  [name] atenolol (TENORMIN) 100 MG tablet Take 1 tablet (100 mg total) by mouth daily.  . cephALEXin (KEFLEX) 500 MG capsule Take 500 mg by mouth 2 (two) times daily.  [name] gentamicin cream (GARAMYCIN) 0.1 % Apply 1 application topically 3 (three) times daily.  . hydrALAZINE (APRESOLINE) 25 MG tablet Take 25 mg by mouth 3 (three) times daily.   . Insulin Glargine (LANTUS SOLOSTAR) 100 UNIT/ML Solostar Pen Inject 24 Units into the skin.   [name] JARDIANCE 25 MG TABS tablet  TAKE 1 TABLET BY MOUTH EVERY DAY IN THE MORNING  . metFORMIN (GLUCOPHAGE-XR) 500 MG 24 hr tablet TAKE 2 TABLETS (1,000 MG TOTAL) BY MOUTH ONCE DAILY.(NEED APPT FOR FURTHER REFILLS)  . torsemide (DEMADEX) 20 MG tablet TAKE 2 TABLETS BY MOUTH EVERY DAY IN THE MORNING    Past Medical History:  Diagnosis Date  . Diabetes mellitus without complication ([place])   . Hypertension   . Microalbuminuria   . Vitamin D deficiency     Past Surgical History:  Procedure Laterality Date  . APPLICATION OF WOUND VAC Left 10/03/2017   Procedure: APPLICATION OF WOUND VAC;  Surgeon: Algernon Huxley, MD;  Location: [facility];  Service: General;  Laterality: Left;  . APPLICATION OF WOUND VAC Left 10/11/2017   Procedure: APPLICATION OF WOUND VAC;  Surgeon: Katha Cabal, MD;  Location: [facility];  Service: Vascular;  Laterality: Left;  . APPLICATION OF WOUND VAC Left 10/14/2017   Procedure: WOUND VAC CHANGE;  Surgeon: Katha Cabal, MD;  Location: [facility];  Service: Vascular;  Laterality: Left;  left lower leg  . APPLICATION OF WOUND VAC Left 10/18/2017   Procedure: WOUND VAC CHANGE;  Surgeon: Katha Cabal, MD;  Location: [facility];  Service: Vascular;  Laterality: Left;  . APPLICATION OF WOUND VAC Left 10/07/2017   Procedure: APPLICATION OF WOUND VAC;  Surgeon: Katha Cabal, MD;  Location: [facility];  Service: Vascular;  Laterality: Left;  . I&D EXTREMITY  Left 10/11/2017   Procedure: IRRIGATION AND DEBRIDEMENT EXTREMITY;  Surgeon: Katha Cabal, MD;  Location: [facility];  Service: Vascular;  Laterality: Left;  . INCISION AND DRAINAGE ABSCESS Right 10/07/2017   Procedure: INCISION AND DRAINAGE ABSCESS;  Surgeon: Katha Cabal, MD;  Location: [facility];  Service: Vascular;  Laterality: Right;  . IRRIGATION AND DEBRIDEMENT ABSCESS Left 10/03/2017   Procedure: IRRIGATION AND DEBRIDEMENT ABSCESS with debridement of skin, soft tissue, muscle 50sq cm;  Surgeon: Algernon Huxley, MD;  Location: [facility];   Service: General;  Laterality: Left;    Social History Social History   Tobacco Use  . Smoking status: Never Smoker  . Smokeless tobacco: Never Used  Substance Use Topics  . Alcohol use: Yes    Comment: beer  . Drug use: Not on file    Family History Family History  Problem Relation Age of Onset  . Diabetes Father   . Kidney disease Father   . Diabetes Daughter     Allergies  Allergen Reactions  . Lisinopril Swelling  . Furosemide Other (See Comments)    Light headed, cough, blurry vision, weakness.     REVIEW OF SYSTEMS (Negative unless checked)  Constitutional: [] Weight loss  [] Fever  [] Chills Cardiac: [] Chest pain   [] Chest pressure   [] Palpitations   [] Shortness of breath when laying flat   [] Shortness of breath with exertion. Vascular:  [] Pain in legs with walking   [] Pain in legs at rest  [] History of DVT   [] Phlebitis   [x] Swelling in legs   [] Varicose veins   [x] Non-healing ulcers Pulmonary:   [] Uses home oxygen   [] Productive cough   [] Hemoptysis   [] Wheeze  [] COPD   [] Asthma Neurologic:  [] Dizziness   [] Seizures   [] History of stroke   [] History of TIA  [] Aphasia   [] Vissual changes   [] Weakness or numbness in arm   [] Weakness or numbness in leg Musculoskeletal:   [] Joint swelling   [] Joint pain   [] Low back pain Hematologic:  [] Easy bruising  [] Easy bleeding   [] Hypercoagulable state   [] Anemic Gastrointestinal:  [] Diarrhea   [] Vomiting  [] Gastroesophageal reflux/heartburn   [] Difficulty swallowing. Genitourinary:  [] Chronic kidney disease   [] Difficult urination  [] Frequent urination   [] Blood in urine Skin:  [x] Rashes   [x] Ulcers  Psychological:  [] History of anxiety   []  History of major depression.  Physical Examination  Vitals:   03/30/18 0849  BP: (!) 171/79  Pulse: (!) 55  Resp: 18  Weight: (!) 400 lb (181.4 kg)  Height: 6\' 4"  (1.93 m)   Body mass index is 48.69 kg/m. Gen: WD/WN, NAD Head: [place]/AT, No temporalis wasting.  Ear/Nose/Throat:  Hearing grossly intact, nares w/o erythema or drainage Eyes: PER, EOMI, sclera nonicteric.  Neck: Supple, no large masses.   Pulmonary:  Good air movement, no audible wheezing bilaterally, no use of accessory muscles.  Cardiac: RRR, no JVD Vascular: Left leg is massively edematous the skin is tight and shiny but it is not erythematous.  Venous stasis changes are about the same as last visit.  The ulcer in the posterior lateral aspect of his left ankle is half the size that it was at the time the last visit it is well granulated and clean.  Right leg demonstrates 3+ edema with moderate to severe venous stasis changes. Vessel Right Left  Radial Palpable Palpable  PT Palpable Palpable  DP Palpable Palpable  Gastrointestinal: Non-distended. No guarding/no peritoneal signs.  Musculoskeletal: M/S 5/5 throughout.  No  deformity or atrophy.  Neurologic: CN 2-12 intact. Symmetrical.  Speech is fluent. Motor exam as listed above. Psychiatric: Judgment intact, Mood & affect appropriate for pt's clinical situation. Dermatologic: Severe venous rashes only 1 remaining ulcer noted left lateral ankle.  No changes consistent with cellulitis. Lymph : No lichenification or skin changes of chronic lymphedema.  CBC Lab Results  Component Value Date   WBC 5.9 10/19/2017   HGB 8.4 (L) 10/19/2017   HCT 25.6 (L) 10/19/2017   MCV 89.1 10/19/2017   PLT 320 10/19/2017    BMET    Component Value Date/Time   NA 139 10/19/2017 0311   K 4.2 10/19/2017 0311   CL 107 10/19/2017 0311   CO2 24 10/19/2017 0311   GLUCOSE 109 (H) 10/19/2017 0311   BUN 37 (H) 10/19/2017 0311   CREATININE 2.11 (H) 10/19/2017 0311   CALCIUM 8.6 (L) 10/19/2017 0311   GFRNONAA 35 (L) 10/19/2017 0311   GFRAA 41 (L) 10/19/2017 0311   CrCl cannot be calculated (Patient's most recent lab result is older than the maximum 21 days allowed.).  COAG Lab Results  Component Value Date   INR 1.05 10/14/2017   INR 1.16 10/06/2017     Radiology No results found.   Assessment/Plan 1. Venous ulcer ([place]) No surgery or intervention at this point in time.   I have had a long discussion with the patient regarding venous insufficiency and why it causes symptoms, specifically venous ulceration . I have discussed with the patient the chronic skin changes that accompany venous insufficiency and the long term sequela such as infection and recurring ulceration. Patient will continue [employer] which will be changed weekly drainage permitting.  He is using his lymph pump every day  In addition, behavioral modification including several periods of elevation of the lower extremities during the day will be continued. Achieving a position with the ankles at heart level was stressed to the patient  The patient is instructed to begin routine exercise, especially walking on a daily basis    2. Chronic venous insufficiency No surgery or intervention at this point in time.   I have reviewed my discussion with the patient regarding lymphedema and why it causes symptoms. Patient will continue wearing graduated compression stockings class 1 (20-30 mmHg) on a daily basis a prescription was given. The patient is reminded to put the stockings on first thing in the morning and removing them in the evening. The patient is instructed specifically not to sleep in the stockings.   In addition, behavioral modification throughout the day will be continued. This will include frequent elevation (such as in a recliner), use of over the counter pain medications as needed and exercise such as walking.  I have reviewed systemic causes for chronic edema such as liver, kidney and cardiac etiologies and there does not appear to be any significant changes in these organ systems over the past year. The patient is under the impression that these organ systems are all stable and unchanged.   The patient will continue aggressive use of the  lymph pump. This will continue to improve the edema control and prevent sequela such as ulcers and infections.     3. Lymphedema See #1 and 2  4. Essential hypertension Continue antihypertensive medications as already ordered, these medications have been reviewed and there are no changes at this time.   5. Diabetes mellitus without complication ([place]) Continue hypoglycemic medications as already ordered, these medications have been reviewed and there are no  changes at this time.  Hgb A1C to be monitored as already arranged by primary service    [name], MD  03/30/2018 8:57 AM

## 2018-03-31 ENCOUNTER — Encounter (INDEPENDENT_AMBULATORY_CARE_PROVIDER_SITE_OTHER): Payer: Self-pay | Admitting: Vascular Surgery

## 2018-03-31 NOTE — Progress Notes (Addendum)
NEERAJ, HOUSAND ([id number]) Visit Report for 03/22/2018 Arrival Information Details Patient Name: [name], [name] Date of Service: 03/22/2018 9:30 AM Medical Record Number: [id number] Patient Account Number: [id number] Date of Birth/Sex: Feb 02, 1971 (47 y.o. M) Treating RN: Cornell Barman Primary Care Katalina Magri: Gaetano Net Other Clinician: Referring Mathews Stuhr: Gaetano Net Treating Ree Alcalde/Extender: Tito Dine in Treatment: 12 Visit Information History Since Last Visit Added or deleted any medications: No Patient Arrived: Ambulatory Any new allergies or adverse reactions: No Arrival Time: 09:39 Had a fall or experienced change in No Accompanied By: self activities of daily living that may affect Transfer Assistance: None risk of falls: Patient Identification Verified: Yes Signs or symptoms of abuse/neglect since last visito No Secondary Verification Process Yes Hospitalized since last visit: No Completed: Has Dressing in Place as Prescribed: No Patient Requires Transmission-Based No Has Compression in Place as Prescribed: No Precautions: Pain Present Now: No Patient Has Alerts: Yes Patient Alerts: DM II noncompressible bilateral Electronic Signature(s) Signed: 03/27/2018 4:51:47 PM By: Gretta Cool, BSN, RN, CWS, Kim RN, BSN Previous Signature: 03/27/2018 4:49:57 PM Version By: Gretta Cool, BSN, RN, CWS, Kim RN, BSN Entered By: Gretta Cool, BSN, RN, CWS, Kim on 03/27/2018 16:51:47 [name], [name] Kitchen ([id number]) -------------------------------------------------------------------------------- Encounter Discharge Information Details Patient Name: [name], [name] Date of Service: 03/22/2018 9:30 AM Medical Record Number: [id number] Patient Account Number: [id number] Date of Birth/Sex: [birth year] (47 y.o. M) Treating RN: Cornell Barman Primary Care Adrien Shankar: Gaetano Net Other Clinician: Referring Kearie Mennen: Gaetano Net Treating Xaviera Flaten/Extender: Tito Dine in Treatment:  12 Encounter Discharge Information Items Discharge Condition: Stable Ambulatory Status: Ambulatory Discharge Destination: Home Transportation: Private Auto Accompanied By: self Schedule Follow-up Appointment: Yes Clinical Summary of Care: Electronic Signature(s) Signed: 03/27/2018 5:26:56 PM By: Gretta Cool, BSN, RN, CWS, Kim RN, BSN Entered By: Gretta Cool, BSN, RN, CWS, Kim on 03/27/2018 17:26:55 [name], [name] ([id number]) -------------------------------------------------------------------------------- General Visit Notes Details Patient Name: [name], [name] Date of Service: 03/22/2018 9:30 AM Medical Record Number: [id number] Patient Account Number: [id number] Date of Birth/Sex: [birth year] (47 y.o. M) Treating RN: Cornell Barman Primary Care Nikolaj Geraghty: Gaetano Net Other Clinician: Referring Trinisha Paget: Gaetano Net Treating Amro Winebarger/Extender: Tito Dine in Treatment: 12 Notes Late entry of documentation due to EMR being unavailable. RN entering all notes from visit. Patient is depressed. Freda Munro has called to make an appointment for him with his PCP to discuss medication to help him. Electronic Signature(s) Signed: 03/27/2018 4:51:30 PM By: Gretta Cool, BSN, RN, CWS, Kim RN, BSN Entered By: Gretta Cool, BSN, RN, CWS, Kim on 03/27/2018 16:51:30 [name], [name] Kitchen ([id number]) -------------------------------------------------------------------------------- Lower Extremity Assessment Details Patient Name: [name], [name] Date of Service: 03/22/2018 9:30 AM Medical Record Number: [id number] Patient Account Number: [id number] Date of Birth/Sex: [birth year] (47 y.o. M) Treating RN: Cornell Barman Primary Care Markela Wee: Gaetano Net Other Clinician: Referring Kathya Wilz: Gaetano Net Treating Cristiana Yochim/Extender: Tito Dine in Treatment: 12 Edema Assessment Assessed: [Left: No] [Right: No] [Left: Edema] [Right: :] Calf Left: Right: Point of Measurement: 44 cm From Medial Instep 63.3 cm 57.3  cm Ankle Left: Right: Point of Measurement: 14 cm From Medial Instep 50.6 cm 40 cm Vascular Assessment Pulses: Dorsalis Pedis Palpable: [Left:Yes] [Right:Yes] Posterior Tibial Extremity colors, hair growth, and conditions: Hair Growth on Extremity: [Left:Yes] [Right:Yes] Temperature of Extremity: [Left:Warm] [Right:Warm] Capillary Refill: [Left:< 3 seconds] [Right:< 3 seconds] Toe Nail Assessment Left: Right: Thick: Yes Yes Discolored: Yes Yes Deformed: No No Improper Length and Hygiene: Yes Yes Electronic Signature(s) Signed: 03/27/2018 4:54:10 PM By: Gretta Cool, BSN,  RN, CWS, Kim RN, BSN Entered By: Gretta Cool, BSN, RN, CWS, Kim on 03/27/2018 16:54:09 [name], [name] ([id number]) -------------------------------------------------------------------------------- Multi Wound Chart Details Patient Name: [name], [name] Date of Service: 03/22/2018 9:30 AM Medical Record Number: [id number] Patient Account Number: [id number] Date of Birth/Sex: [birth year] (47 y.o. M) Treating RN: Cornell Barman Primary Care Etola Mull: Gaetano Net Other Clinician: Referring Zahriah Roes: Gaetano Net Treating Natoria Archibald/Extender: Tito Dine in Treatment: 12 Vital Signs Height(in): 76 Pulse(bpm): 58 Weight(lbs): 379.5 Blood Pressure(mmHg): 173/74 Body Mass Index(BMI): 46 Temperature(F): 98.4 Respiratory Rate 18 (breaths/min): Photos: Wound Location: Right, Proximal, Medial Lower Right, Distal, Medial Lower Left, Lateral, Posterior Lower Leg Leg Leg Wounding Event: Gradually Appeared Gradually Appeared Gradually Appeared Primary Etiology: Lymphedema Lymphedema Diabetic Wound/Ulcer of the Lower Extremity Secondary Etiology: Diabetic Wound/Ulcer of the Diabetic Wound/Ulcer of the Lymphedema Lower Extremity Lower Extremity Date Acquired: 03/15/2018 03/15/2018 09/30/2017 Weeks of Treatment: 1 1 12  Wound Status: Healed - Epithelialized Healed - Epithelialized Open Clustered Wound: Yes No No Measurements  L x W x D 0x0x0 0x0x0 2.4x5.3x1 (cm) Area (cm) : 0 0 9.99 Volume (cm) : 0 0 9.99 % Reduction in Area: 100.00% 100.00% 64.70% % Reduction in Volume: 100.00% 100.00% 11.70% Classification: Full Thickness Without Full Thickness Without Grade 2 Exposed Support Structures Exposed Support Structures Periwound Skin Texture: No Abnormalities Noted No Abnormalities Noted No Abnormalities Noted Periwound Skin Moisture: No Abnormalities Noted No Abnormalities Noted No Abnormalities Noted Periwound Skin Color: No Abnormalities Noted No Abnormalities Noted No Abnormalities Noted Tenderness on Palpation: No No No Wound Number: [id number]  Photos: [name], [name] ([id number]) Wound Location: Left, Proximal, Medial Lower Left, Proximal, Posterior Lower Right, Posterior Lower Leg Leg Leg Wounding Event: Gradually Appeared Gradually Appeared Gradually Appeared Primary Etiology: Lymphedema Lymphedema Lymphedema Secondary Etiology: N/A N/A Diabetic Wound/Ulcer of the Lower Extremity Date Acquired: 02/15/2018 02/22/2018 03/08/2018 Weeks of Treatment: 5 4 1  Wound Status: Open Healed - Epithelialized Open Clustered Wound: No Yes No Measurements L x W x D 0.7x1.4x0.1 0x0x0 3.4x1.7x0.1 (cm) Area (cm) : 0.77 0 4.54 Volume (cm) : 0.077 0 0.454 % Reduction in Area: 10.90% 100.00% 56.50% % Reduction in Volume: 10.50% 100.00% 56.60% Classification: Full Thickness Without Full Thickness Without Full Thickness Without Exposed Support Structures Exposed Support Structures Exposed Support Structures Periwound Skin Texture: No Abnormalities Noted No Abnormalities Noted No Abnormalities Noted Periwound Skin Moisture: No Abnormalities Noted No Abnormalities Noted No Abnormalities Noted Periwound Skin Color: No Abnormalities Noted No Abnormalities Noted No Abnormalities Noted Tenderness on Palpation: No No No Treatment Notes Wound #2 (Left, Lateral, Posterior Lower Leg) 1. Cleansed with: Clean wound with Normal  Saline Cleanse wound with antibacterial soap and water 2. Anesthetic Topical Lidocaine 4% cream to wound bed prior to debridement 3. Peri-wound Care: Moisturizing lotion 4. Dressing Applied: Calcium Alginate with Silver 5. Secondary Dressing Applied ABD Pad 7. Secured with 4 Layer Compression System - Bilateral Notes silvercel, xsorb Wound #7 (Left, Proximal, Medial Lower Leg) 1. Cleansed with: Clean wound with Normal Saline Cleanse wound with antibacterial soap and water 2. Anesthetic Topical Lidocaine 4% cream to wound bed prior to debridement 3. Peri-wound Care: Moisturizing lotion 4. Dressing Applied: Tugwell, Tobe A. ([id number]) Calcium Alginate with Silver 5. Secondary Dressing Applied ABD Pad 7. Secured with 4 Layer Compression System - Bilateral Notes silvercel, xsorb Wound #9 (Right, Posterior Lower Leg) 1. Cleansed with: Clean wound with Normal Saline Cleanse wound with antibacterial soap and water 2. Anesthetic Topical Lidocaine 4% cream to wound bed prior  to debridement 3. Peri-wound Care: Moisturizing lotion 4. Dressing Applied: Calcium Alginate with Silver 5. Secondary Dressing Applied ABD Pad 7. Secured with 4 Layer Compression System - Bilateral Notes silvercel, xsorb Electronic Signature(s) Signed: 03/29/2018 8:05:08 AM By: Linton Ham MD Previous Signature: 03/27/2018 5:10:35 PM Version By: Gretta Cool, BSN, RN, CWS, Kim RN, BSN Entered By: Linton Ham on 03/28/2018 04:01:30 [name], [name] Kitchen ([id number]) -------------------------------------------------------------------------------- Multi-Disciplinary Care Plan Details Patient Name: Capasso, Daulton A. Date of Service: 03/22/2018 9:30 AM Medical Record Number: [id number] Patient Account Number: [id number] Date of Birth/Sex: 1971/04/09 (47 y.o. M) Treating RN: Cornell Barman Primary Care Talicia Sui: Gaetano Net Other Clinician: Referring Tajah Schreiner: Gaetano Net Treating Javiana Anwar/Extender: Tito Dine in Treatment: 12 Active Inactive ` Abuse / Safety / Falls / Self Care Management Nursing Diagnoses: Potential for falls Goals: Patient will remain injury free related to falls Date Initiated: 12/28/2017 Target Resolution Date: 04/08/2018 Goal Status: Active Interventions: Assess fall risk on admission and as needed Notes: ` Nutrition Nursing Diagnoses: Impaired glucose control: actual or potential Goals: Patient/caregiver verbalizes understanding of need to maintain therapeutic glucose control per primary care physician Date Initiated: 12/28/2017 Target Resolution Date: 04/08/2018 Goal Status: Active Interventions: Provide education on elevated blood sugars and impact on wound healing Notes: ` Orientation to the Wound Care Program Nursing Diagnoses: Knowledge deficit related to the wound healing center program Goals: Patient/caregiver will verbalize understanding of the [facility] Program Date Initiated: 12/28/2017 Target Resolution Date: 04/08/2018 Goal Status: Active Interventions: [name], [name] ([id number]) Provide education on orientation to the wound center Notes: ` Venous Leg Ulcer Nursing Diagnoses: Knowledge deficit related to disease process and management Goals: Patient will maintain optimal edema control Date Initiated: 03/27/2018 Target Resolution Date: 04/27/2018 Goal Status: Active Interventions: Assess peripheral edema status every visit. Compression as ordered Treatment Activities: Therapeutic compression applied : 03/22/2018 Notes: ` Wound/Skin Impairment Nursing Diagnoses: Impaired tissue integrity Goals: Ulcer/skin breakdown will heal within 14 weeks Date Initiated: 12/28/2017 Target Resolution Date: 04/08/2018 Goal Status: Active Interventions: Assess patient/caregiver ability to obtain necessary supplies Assess patient/caregiver ability to perform ulcer/skin care regimen upon admission and as needed Assess  ulceration(s) every visit Notes: Electronic Signature(s) Signed: 03/27/2018 4:54:54 PM By: Gretta Cool, BSN, RN, CWS, Kim RN, BSN Entered By: Gretta Cool, BSN, RN, CWS, Kim on 03/27/2018 16:54:52 [name], [name] ([id number]) -------------------------------------------------------------------------------- Pain Assessment Details Patient Name: Hershey, Shaquille A. Date of Service: 03/22/2018 9:30 AM Medical Record Number: [id number] Patient Account Number: [id number] Date of Birth/Sex: 1971-03-05 (47 y.o. M) Treating RN: Cornell Barman Primary Care Anvika Gashi: Gaetano Net Other Clinician: Referring Saquoia Sianez: Gaetano Net Treating Alfonzo Arca/Extender: Tito Dine in Treatment: 12 Active Problems Location of Pain Severity and Description of Pain Patient Has Paino No Site Locations Pain Management and Medication Current Pain Management: Electronic Signature(s) Signed: 03/27/2018 4:50:12 PM By: Gretta Cool, BSN, RN, CWS, Kim RN, BSN Entered By: Gretta Cool, BSN, RN, CWS, Kim on 03/27/2018 16:50:12 [name], [name] ([id number]) -------------------------------------------------------------------------------- Patient/Caregiver Education Details Patient Name: [name], [name] Date of Service: 03/22/2018 9:30 AM Medical Record Number: [id number] Patient Account Number: [id number] Date of Birth/Gender: November 28, 1970 (47 y.o. M) Treating RN: Cornell Barman Primary Care Physician: Gaetano Net Other Clinician: Referring Physician: Gaetano Net Treating Physician/Extender: Tito Dine in Treatment: 12 Education Assessment Education Provided To: Patient Education Topics Provided Venous: Handouts: Controlling Swelling with Multilayered Compression Wraps Methods: Explain/Verbal Responses: State content correctly Electronic Signature(s) Signed: 03/31/2018 6:17:48 PM By: Gretta Cool, BSN, RN, CWS, Kim RN, BSN Entered By: Gretta Cool, BSN,  RN, CWS, Kim on 03/27/2018 17:27:25 [name], [name]  ([id number]) -------------------------------------------------------------------------------- Wound Assessment Details Patient Name: Farnam, Jarriel A. Date of Service: 03/22/2018 9:30 AM Medical Record Number: [id number] Patient Account Number: [id number] Date of Birth/Sex: 06-08-1971 (47 y.o. M) Treating RN: Cornell Barman Primary Care Tykel Badie: Gaetano Net Other Clinician: Referring Julio Storr: Gaetano Net Treating Rayleigh Gillyard/Extender: Tito Dine in Treatment: 12 Wound Status Wound Number: 10 Primary Etiology: Lymphedema Wound Location: Right, Proximal, Medial Lower Leg Secondary Diabetic Wound/Ulcer of the Lower Etiology: Extremity Wounding Event: Gradually Appeared Wound Status: Healed - Epithelialized Date Acquired: 03/15/2018 Weeks Of Treatment: 1 Clustered Wound: Yes Photos Photo Uploaded By: Montey Hora on 03/27/2018 17:40:29 Wound Measurements Length: (cm) 0 Width: (cm) 0 Depth: (cm) 0 Area: (cm) 0 Volume: (cm) 0 % Reduction in Area: 100% % Reduction in Volume: 100% Wound Description Full Thickness Without Exposed Support Classification: Structures Periwound Skin Texture Texture Color No Abnormalities Noted: No No Abnormalities Noted: No Moisture No Abnormalities Noted: No Electronic Signature(s) Signed: 03/31/2018 6:17:48 PM By: Gretta Cool, BSN, RN, CWS, Kim RN, BSN Entered By: Gretta Cool, BSN, RN, CWS, Kim on 03/27/2018 16:52:43 Ezzell, Eddy A. ([id number]) -------------------------------------------------------------------------------- Wound Assessment Details Patient Name: Navejas, Hershy A. Date of Service: 03/22/2018 9:30 AM Medical Record Number: [id number] Patient Account Number: [id number] Date of Birth/Sex: [birth year] (47 y.o. M) Treating RN: Cornell Barman Primary Care Kaimani Clayson: Gaetano Net Other Clinician: Referring Court Gracia: Gaetano Net Treating Chriss Redel/Extender: Tito Dine in Treatment: 12 Wound Status Wound Number: 11 Primary  Etiology: Lymphedema Wound Location: Right, Distal, Medial Lower Leg Secondary Diabetic Wound/Ulcer of the Lower Etiology: Extremity Wounding Event: Gradually Appeared Wound Status: Healed - Epithelialized Date Acquired: 03/15/2018 Weeks Of Treatment: 1 Clustered Wound: No Photos Photo Uploaded By: Montey Hora on 03/27/2018 17:39:28 Wound Measurements Length: (cm) 0 Width: (cm) 0 Depth: (cm) 0 Area: (cm) 0 Volume: (cm) 0 % Reduction in Area: 100% % Reduction in Volume: 100% Wound Description Full Thickness Without Exposed Support Classification: Structures Periwound Skin Texture Texture Color No Abnormalities Noted: No No Abnormalities Noted: No Moisture No Abnormalities Noted: No Electronic Signature(s) Signed: 03/31/2018 6:17:48 PM By: Gretta Cool, BSN, RN, CWS, Kim RN, BSN Entered By: Gretta Cool, BSN, RN, CWS, Kim on 03/27/2018 16:52:43 Polack, Mivaan A. ([id number]) -------------------------------------------------------------------------------- Wound Assessment Details Patient Name: Lowenthal, Thanos A. Date of Service: 03/22/2018 9:30 AM Medical Record Number: [id number] Patient Account Number: [id number] Date of Birth/Sex: 1971-05-06 (47 y.o. M) Treating RN: Cornell Barman Primary Care Evangelos Paulino: Gaetano Net Other Clinician: Referring Skyllar Notarianni: Gaetano Net Treating Vauda Salvucci/Extender: Tito Dine in Treatment: 12 Wound Status Wound Number: 2 Primary Etiology: Diabetic Wound/Ulcer of the Lower Extremity Wound Location: Left, Lateral, Posterior Lower Leg Secondary Lymphedema Wounding Event: Gradually Appeared Etiology: Date Acquired: 09/30/2017 Wound Status: Open Weeks Of Treatment: 12 Clustered Wound: No Photos Photo Uploaded By: Montey Hora on 03/27/2018 17:39:29 Wound Measurements Length: (cm) 2.4 Width: (cm) 5.3 Depth: (cm) 1 Area: (cm) 9.99 Volume: (cm) 9.99 % Reduction in Area: 64.7% % Reduction in Volume: 11.7% Wound  Description Classification: Grade 2 Periwound Skin Texture Texture Color No Abnormalities Noted: No No Abnormalities Noted: No Moisture No Abnormalities Noted: No Electronic Signature(s) Signed: 03/31/2018 6:17:48 PM By: Gretta Cool, BSN, RN, CWS, Kim RN, BSN Entered By: Gretta Cool, BSN, RN, CWS, Kim on 03/27/2018 16:52:44 Pooley, Kelon A. ([id number]) -------------------------------------------------------------------------------- Wound Assessment Details Patient Name: Denne, Isael A. Date of Service: 03/22/2018 9:30 AM Medical Record Number: [id number] Patient Account Number: [id number] Date of Birth/Sex: 1971-02-11 (47 y.o. M) Treating RN:  Cornell Barman Primary Care Damarys Speir: Gaetano Net Other Clinician: Referring Twisha Vanpelt: Gaetano Net Treating Munirah Doerner/Extender: Tito Dine in Treatment: 12 Wound Status Wound Number: 7 Primary Etiology: Lymphedema Wound Location: Left, Proximal, Medial Lower Leg Wound Status: Open Wounding Event: Gradually Appeared Date Acquired: 02/15/2018 Weeks Of Treatment: 5 Clustered Wound: No Photos Photo Uploaded By: Montey Hora on 03/27/2018 17:40:00 Wound Measurements Length: (cm) 0.7 Width: (cm) 1.4 Depth: (cm) 0.1 Area: (cm) 0.77 Volume: (cm) 0.077 % Reduction in Area: 10.9% % Reduction in Volume: 10.5% Wound Description Full Thickness Without Exposed Support Classification: Structures Periwound Skin Texture Texture Color No Abnormalities Noted: No No Abnormalities Noted: No Moisture No Abnormalities Noted: No Electronic Signature(s) Signed: 03/31/2018 6:17:48 PM By: Gretta Cool, BSN, RN, CWS, Kim RN, BSN Entered By: Gretta Cool, BSN, RN, CWS, Kim on 03/27/2018 16:52:44 Aiken, Avid A. ([id number]) -------------------------------------------------------------------------------- Wound Assessment Details Patient Name: Kazmi, Janie A. Date of Service: 03/22/2018 9:30 AM Medical Record Number: [id number] Patient Account Number: [id number] Date  of Birth/Sex: 10-Dec-1970 (47 y.o. M) Treating RN: Cornell Barman Primary Care Keiona Jenison: Gaetano Net Other Clinician: Referring Adriane Guglielmo: Gaetano Net Treating Dicie Edelen/Extender: Tito Dine in Treatment: 12 Wound Status Wound Number: 8 Primary Etiology: Lymphedema Wound Location: Left, Proximal, Posterior Lower Leg Wound Status: Healed - Epithelialized Wounding Event: Gradually Appeared Date Acquired: 02/22/2018 Weeks Of Treatment: 4 Clustered Wound: Yes Photos Photo Uploaded By: Montey Hora on 03/27/2018 17:40:01 Wound Measurements Length: (cm) 0 Width: (cm) 0 Depth: (cm) 0 Area: (cm) 0 Volume: (cm) 0 % Reduction in Area: 100% % Reduction in Volume: 100% Wound Description Full Thickness Without Exposed Support Classification: Structures Periwound Skin Texture Texture Color No Abnormalities Noted: No No Abnormalities Noted: No Moisture No Abnormalities Noted: No Electronic Signature(s) Signed: 03/31/2018 6:17:48 PM By: Gretta Cool, BSN, RN, CWS, Kim RN, BSN Entered By: Gretta Cool, BSN, RN, CWS, Kim on 03/27/2018 16:52:45 Thumma, Billal A. ([id number]) -------------------------------------------------------------------------------- Wound Assessment Details Patient Name: Stock, Silvano A. Date of Service: 03/22/2018 9:30 AM Medical Record Number: [id number] Patient Account Number: [id number] Date of Birth/Sex: 04-09-1971 (47 y.o. M) Treating RN: Cornell Barman Primary Care Tamantha Saline: Gaetano Net Other Clinician: Referring Hanad Leino: Gaetano Net Treating Emmalynne Courtney/Extender: Tito Dine in Treatment: 12 Wound Status Wound Number: 9 Primary Etiology: Lymphedema Wound Location: Right, Posterior Lower Leg Secondary Diabetic Wound/Ulcer of the Lower Etiology: Extremity Wounding Event: Gradually Appeared Wound Status: Open Date Acquired: 03/08/2018 Weeks Of Treatment: 1 Clustered Wound: No Photos Photo Uploaded By: Montey Hora on 03/27/2018 17:40:14 Wound  Measurements Length: (cm) 3.4 Width: (cm) 1.7 Depth: (cm) 0.1 Area: (cm) 4.54 Volume: (cm) 0.454 % Reduction in Area: 56.5% % Reduction in Volume: 56.6% Wound Description Full Thickness Without Exposed Support Classification: Structures Periwound Skin Texture Texture Color No Abnormalities Noted: No No Abnormalities Noted: No Moisture No Abnormalities Noted: No Electronic Signature(s) Signed: 03/31/2018 6:17:48 PM By: Gretta Cool, BSN, RN, CWS, Kim RN, BSN Entered By: Gretta Cool, BSN, RN, CWS, Kim on 03/27/2018 16:52:45 Leadbetter, Handsome A. ([id number]) -------------------------------------------------------------------------------- Vitals Details Patient Name: Neyra, Carmelo A. Date of Service: 03/22/2018 9:30 AM Medical Record Number: [id number] Patient Account Number: [id number] Date of Birth/Sex: [birth year] (47 y.o. M) Treating RN: Cornell Barman Primary Care Doyne Ellinger: Gaetano Net Other Clinician: Referring Aryella Besecker: Gaetano Net Treating Elyna Pangilinan/Extender: Tito Dine in Treatment: 12 Vital Signs Time Taken: 10:05 Temperature (F): 98.4 Height (in): 76 Pulse (bpm): 58 Weight (lbs): 379.5 Respiratory Rate (breaths/min): 18 Body Mass Index (BMI): 46.2 Blood Pressure (mmHg): 173/74 Reference Range: 80 - 120 mg /  dl Electronic Signature(s) Signed: 03/27/2018 4:50:36 PM By: Gretta Cool, BSN, RN, CWS, Kim RN, BSN Entered By: Gretta Cool, BSN, RN, CWS, Kim on 03/27/2018 16:50:35

## 2018-04-09 NOTE — Progress Notes (Signed)
JOH, RAO ([id number]) Visit Report for 03/29/2018 Debridement Details Patient Name: [name], [name] Date of Service: 03/29/2018 8:00 AM Medical Record Number: [id number] Patient Account Number: [id number] Date of Birth/Sex: [birth year] (47 y.o. M) Treating [name]: Cornell Barman Primary Care Provider: Gaetano Net Other Clinician: Referring Provider: Gaetano Net Treating Provider/Extender: Tito Dine in Treatment: 13 Debridement Performed for Wound #2 Left,Lateral,Posterior Lower Leg Assessment: Performed By: Physician Ricard Dillon, MD Debridement Type: Debridement Severity of Tissue Pre Fat layer exposed Debridement: Pre-procedure Verification/Time Yes - 08:23 Out Taken: Start Time: 08:23 Pain Control: Other : lidocaoine 4% Total Area Debrided (L x W): 2 (cm) x 4 (cm) = 8 (cm) Tissue and other material Viable, Non-Viable, Slough, Subcutaneous, Slough debrided: Level: Skin/Subcutaneous Tissue Debridement Description: Excisional Instrument: Curette Bleeding: Moderate Hemostasis Achieved: Pressure Response to Treatment: Procedure was tolerated well Level of Consciousness: Awake and Alert Post Debridement Measurements of Total Wound Length: (cm) 2 Width: (cm) 5.5 Depth: (cm) 0.7 Volume: (cm) 6.048 Character of Wound/Ulcer Post Debridement: Stable Severity of Tissue Post Debridement: Fat layer exposed Post Procedure Diagnosis Same as Pre-procedure Electronic Signature(s) Signed: 03/29/2018 6:17:43 PM By: Linton Ham MD Signed: 03/31/2018 6:17:48 PM By: Gretta Cool, BSN, [name], CWS, Kim [name], [name], [name] ([id number]) -------------------------------------------------------------------------------- HPI Details Patient Name: [name], [name] Date of Service: 03/29/2018 8:00 AM Medical Record Number: [id number] Patient Account Number: [id number] Date of Birth/Sex: [birth year] (47 y.o. M) Treating [name]: Cornell Barman Primary Care Provider: Gaetano Net Other Clinician: Referring Provider: Gaetano Net Treating Provider/Extender: Tito Dine in Treatment: 13 History of Present Illness HPI Description: 12/28/17; this is a now 47 year old man who is a type II diabetic. He was hospitalized from 10/01/17 through 10/19/17. He had an MSSA soft tissue and skin infection. 2 open areas on the left leg were identified he has a smaller area on the left medial calf superiorly just below the knee and a wound just above the left ankle on the posterior medial aspect. I think both of these were surgical IandD sites when he was in the hospital. He was discharged with a wound VAC at that point however this is since been taken off. He follows with Dr. Ola Spurr for the [facility] and he is still on chronic Keflex at 500 twice a day. At that time he was hospitalized his hemoglobin A1c was 15.1 however if I'm reading his endocrinologist notes correctly that is improved. He has been following with Dr. [name] at vein and vascular and he has been applying calcium alginate and Unna boots. He has home health changing the dressing. They have also been attempting to get him external compression pumps although the patient is unaware whether they've been approved by insurance at this point. as mentioned he has a smaller clean wound on the right lateral calf just below the knee and he has a much larger area just above the left ankle medially and posteriorly. Our intake nurse reported greenish purulent looking drainage.the patient did have surgical material sent to pathology in February. This showed chronic abscess The patient also has lymphedema stage III in the left greater than right lower extremities. He has a history of blisters with wounds but these of all were always healed. The patient thinks that the lymphedema may have been present since he was about 47 years old i.e. about 30 years. He does not have graded pressure  stockings and has not worn stockings. He does not have a  distant history of DVT PE or phlebitis. He has not been systemically unwell fever no chills. He states that his Lasix is recently been reduced. He tells me his kidney function is at "30%" and he has been followed by Dr. [name] of nephrology. At one point he was on Lasix 80 twice a day however that's been cut back and he is now on Lasix at 20 twice a day. The patient has a history of PAD listed in his records although he comes from Dr. [name] I don't think is felt to have significant PAD. ABIs in our clinic were noncompressible bilaterally. 01/04/18; patient has a large wound on the left lateral lower calf and a small wound on the left medial upper calf. He has been to see his nephrologist who changed him to Demadex 40 mg a day. I'm hopeful this will help with his systemic fluid overload. He also has stage III lymphedema. Really no change in the 2 wounds since last week 01/11/18; the patient is down 13 pounds. He put his stage III lymphedema left leg in 4K compression last week and there is less edema fluid however we still haven't been able to communicate with home health but [facility] but the dressings have not been changed. The patient noted an odor last week. He is also had compression pumps ordered by [facility] this as a not completed the paperwork stage. 01/18/18; patient continues to lose weight. Stage III lymphedema in the left greater than right leg under for alert compression. The major wound is on the left lateral ankle area. He apparently has bilateral compression pumps being brought to his house, these were ordered by [facility] Notable for the fact today he had some blisters on the right anterior leg together with some skin nodules. This is no doubt secondary to severe lymphedema. 01/25/18; the patient has obtained his compression pumps and is using them per vein and vascular  instructions 3 times a day for an hour. He also saw [name] of vein and vascular. He was felt to have venous insufficiency but did not suggest any intervention also improved edema. It was suggested that he have compression stockings 20-30 mm on a daily basis in addition to compression pumps. The patient arrives in clinic today with a layer of unna under for layer compression. he seems to have some trouble with the degree of compression. He has open areas on the left lateral ankle area which is his major wound left upper medial calf and a superficial open area on the right anterior shin area which was blistered last week. He has skin changes on the right anterior Mitchum, Kagan A. ([id number]) calf which I think are no doubt secondary to lymphedema skin nodules etc. 02/01/18; the patient comes in telling us his nephrologist have to his torsemide. Unfortunately today he is put on 7 pounds by our scales. He has blisters all over the anterior and medial part of his right calf and a new open wound. He also has soupy green drainage coming out of the left lateral calf /ankle wound. 02/08/18; culture I did last week of the left lateral ankle wound grew both Pseudomonas and Morganella. He is on Keflex from Dr. Ola Spurr in the hospital. I will need to review these notes.in any case Keflex is not going to cover these 2 organisms. I'm probably going to added ciprofloxacin today for 1 week. A lot of drainage that looks purulent last week. He is not complaining  of pain however he has managed to put on 10 pounds in 2 weeks by our scales in this clinic. He is going to see his nephrologist tomorrow 02/15/18; he completed the ciprofloxacin I gave him last week. Notable that he is up to 379 pounds today which is up 16 pounds from 2 weeks ago. He is complaining of orthopnea but doesn't have any chest pain. 02/22/18; he continues to have weight gain. R intake nurse reports again purulent green drainage coming out of the  lateral wound on the lateral left calf. He has small open area on the right anterior leg. His torsemide was increased to 2 tablets a day I believe this is 40 mg last week in response to the call admitted to Dr. Keturah Barre office. He follows up with Dr. [name] and Dr. Delana Meyer tomorrow 03/01/18 his weight essentially stable today at 383 pounds. Drainage out of the left lateral wound on the lower left calf/ankle is a lot less. Culture last time grew Pseudomonas. I put him on cefdinir. He has been to Dr. Keturah Barre office no adjustments in his diuretics. Dr. Nicoletta Dress prescribed a wraparound stocking for the right leg.there is no open area on the right leg. He has a superficial area on the left medial calf, left posterior calf and in the large area on the left lateral however this looks better 03/15/18; weight is not up to 393 pounds. He saw his nephrologist yesterday Dr. [name] will increase the Demadex I'm hopeful this will help with the edema control. I'm using silver alginate to all his wounds. In particular the left lateral ankle looks better. oUnfortunately he has new open areas on the right lateral calf that will include use of his compression stocking at least in the short to medium term. He has new wounds o3 on the right lateral calf. One of these has some size however all numerous superficial 03/22/18; his weight is stabilized a bit. Just adjustment of his diuretics by his nephrologist. There is no doubt he has some degree of systemic fluid overload on top of severe left greater than right lymphedema. 2 weeks ago tried to transition him to stockings on the right leg however he developed re-breakdown of skin on the right leg and we had to put him back in compression last week. He also uses external compression pumps and claims to be compliant Continued concern about his depression today 03/29/18; several ongoing issues with this patient; oHe no longer has home health coverage apparently secondary to a lapse  in insurance. He is apparently transitioning from short for long-term disability. [facility] was changing his compression wraps on Monday and Friday. oHe has gained 10 pounds since last week oSees Dr. [name] tomorrow oSaw his primary doctor last week about the depression. It sounds as though he declined pharmacologic management. He seems somewhat better today. We were concerned last week when he came. Somewhat better today oHe is using his compression pumps once a day at home, I have asked for twice a day if possible especially on the left leg oFollows up with his nephrologist in mid-August. He is managing his diuretic for I think stage IV chronic renal failure oParadoxically his wounds actually look better Electronic Signature(s) Signed: 03/29/2018 6:17:43 PM By: Linton Ham MD Entered By: Linton Ham on 03/29/2018 08:39:28 [place], Tilden AMarland Kitchen ([id number]) -------------------------------------------------------------------------------- Physical Exam Details Patient Name: [name], [name] Date of Service: 03/29/2018 8:00 AM Medical Record Number: [id number] Patient Account Number: [id number] Date of Birth/Sex: 01-12-1971 (47 y.o. M)  Treating [name]: Cornell Barman Primary Care Provider: Gaetano Net Other Clinician: Referring Provider: Gaetano Net Treating Provider/Extender: Tito Dine in Treatment: 13 Constitutional Patient is hypertensive.. Pulse regular and within target range for patient.[name] Kitchen Respirations regular, non-labored and within target range.. Temperature is normal and within the target range for the patient.[name] Kitchen appears in no distress. Respiratory Respiratory effort is easy and symmetric bilaterally. Rate is normal at rest and on room air.. Cardiovascular through the lymphedema his pedal pulses are palpable at the dorsalis pedis. Edema present in both extremities.I think this actually looks better. He always has more lymphedema left greater than  right. Psychiatric Patient appears depressed today however better than last week. Notes wound exam oHis large wound on the lateral calf just above the ankle actually is smaller. Using an open curet I removed nonviable necrotic tissue from the wound bed and slept from the wound circumference. Hemostasis with direct pressure. He has a healthy- looking wound here. oThe only other wound I can see today is on the right posterior calf this is superficial. Electronic Signature(s) Signed: 03/29/2018 6:17:43 PM By: Linton Ham MD Entered By: Linton Ham on 03/29/2018 08:38:03 [name], [name] ([id number]) -------------------------------------------------------------------------------- Physician Orders Details Patient Name: [name], [name] Date of Service: 03/29/2018 8:00 AM Medical Record Number: [id number] Patient Account Number: [id number] Date of Birth/Sex: May 20, 1971 (47 y.o. M) Treating [name]: Cornell Barman Primary Care Provider: Gaetano Net Other Clinician: Referring Provider: Gaetano Net Treating Provider/Extender: Tito Dine in Treatment: 1 Verbal / Phone Orders: No Diagnosis Coding Wound Cleansing Wound #2 Left,Lateral,Posterior Lower Leg o Cleanse wound with mild soap and water Wound #9 Right,Posterior Lower Leg o Cleanse wound with mild soap and water Anesthetic (add to Medication List) Wound #2 Left,Lateral,Posterior Lower Leg o Topical Lidocaine 4% cream applied to wound bed prior to debridement (In Clinic Only). Wound #9 Right,Posterior Lower Leg o Topical Lidocaine 4% cream applied to wound bed prior to debridement (In Clinic Only). Skin Barriers/Peri-Wound Care Wound #2 Left,Lateral,Posterior Lower Leg o Moisturizing lotion - to legs. Wound #9 Right,Posterior Lower Leg o Moisturizing lotion - to legs. Primary Wound Dressing Wound #2 Left,Lateral,Posterior Lower Leg o Silver Alginate Wound #9 Right,Posterior Lower Leg o Silver  Alginate Secondary Dressing Wound #2 Left,Lateral,Posterior Lower Leg o ABD pad o XtraSorb Wound #9 Right,Posterior Lower Leg o ABD pad o XtraSorb Dressing Change Frequency Wound #2 Left,Lateral,Posterior Lower Leg o Change Dressing Monday, Wednesday, Friday - Wednesday in clinic. o Change Dressing Monday, Wednesday, Friday - Wednesday in clinic. Wound #9 Right,Posterior Lower Leg [name], [name] ([id number]) o Change Dressing Monday, Wednesday, Friday - Wednesday in clinic. o Change Dressing Monday, Wednesday, Friday - Wednesday in clinic. Follow-up Appointments Wound #2 Left,Lateral,Posterior Lower Leg o Return Appointment in 1 week. Wound #9 Right,Posterior Lower Leg o Return Appointment in 1 week. Edema Control Wound #2 Left,Lateral,Posterior Lower Leg o 4 Layer Compression System - Bilateral o Elevate legs to the level of the heart and pump ankles as often as possible o Compression Pump: Use compression pump on left lower extremity for 30 minutes, twice daily. - 1 hour twice daily o Compression Pump: Use compression pump on right lower extremity for 30 minutes, twice daily. - 1 hour twice daily Wound #9 Right,Posterior Lower Leg o 4 Layer Compression System - Bilateral o Elevate legs to the level of the heart and pump ankles as often as possible o Compression Pump: Use compression pump on left lower extremity for 30 minutes, twice daily. - 1  hour twice daily o Compression Pump: Use compression pump on right lower extremity for 30 minutes, twice daily. - 1 hour twice daily Home Health Wound #2 [place] Visits - Monday and Friday o Home Health Nurse may visit PRN to address patientos wound care needs. o FACE TO FACE ENCOUNTER: MEDICARE and MEDICAID PATIENTS: I certify that this patient is under my care and that I had a face-to-face encounter that meets the physician face-to-face  encounter requirements with this patient on this date. The encounter with the patient was in whole or in part for the following MEDICAL CONDITION: (primary reason for [place]) MEDICAL NECESSITY: I certify, that based on my findings, NURSING services are a medically necessary home health service. HOME BOUND STATUS: I certify that my clinical findings support that this patient is homebound (i.e., Due to illness or injury, pt requires aid of supportive devices such as crutches, cane, wheelchairs, walkers, the use of special transportation or the assistance of another person to leave their place of residence. There is a normal inability to leave the home and doing so requires considerable and taxing effort. Other absences are for medical reasons / religious services and are infrequent or of short duration when for other reasons). o If current dressing causes regression in wound condition, may D/C ordered dressing product/s and apply Normal Saline Moist Dressing daily until next [place] / Other MD appointment. [place] of regression in wound condition at [phone]. Wound #9 Right,Posterior Lower Leg o [place] Visits - Monday and Friday o Home Health Nurse may visit PRN to address patientos wound care needs. o FACE TO FACE ENCOUNTER: MEDICARE and MEDICAID PATIENTS: I certify that this patient is under my care and that I had a face-to-face encounter that meets the physician face-to-face encounter requirements with this patient on this date. The encounter with the patient was in whole or in part for the following MEDICAL CONDITION: (primary reason for [place]) MEDICAL NECESSITY: I certify, that based on my findings, NURSING services are a medically necessary home health service. HOME BOUND STATUS: I certify that my clinical findings support that this patient is homebound (i.e., Due to illness or injury, pt requires aid of supportive  devices such as crutches, cane, wheelchairs, walkers, the use of special transportation or the assistance of another person to leave their place of residence. There is a normal inability to leave the home Amaro, Toddrick A. ([id number]) and doing so requires considerable and taxing effort. Other absences are for medical reasons / religious services and are infrequent or of short duration when for other reasons). o If current dressing causes regression in wound condition, may D/C ordered dressing product/s and apply Normal Saline Moist Dressing daily until next [place] / Other MD appointment. [place] of regression in wound condition at [phone]. Electronic Signature(s) Signed: 03/29/2018 6:17:43 PM By: Linton Ham MD Signed: 03/31/2018 6:17:48 PM By: Gretta Cool, BSN, [name], CWS, Kim [name], [name] Entered By: Gretta Cool, BSN, [name], CWS, Kim on 03/29/2018 08:22:56 [name], [name] ([id number]) -------------------------------------------------------------------------------- Problem List Details Patient Name: Fahr, Garion A. Date of Service: 03/29/2018 8:00 AM Medical Record Number: [id number] Patient Account Number: [id number] Date of Birth/Sex: Aug 16, 1971 (47 y.o. 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 continue silver Medical alternate under Decision compression Making : -debridement of the wound done I89.0 Lymphedema, not elsewhere classified 12/28/2017 Yes Yes Status Complications Interventions No he has gained 10 pounds up to 399 pounds since last week opatient  sees Dr. Angelena [facility] tomorrow oI've Decision asked him to try to Making : use his external compression pumps twice a day I87.332 Chronic venous hypertension (idiopathic) with ulcer and 12/28/2017 No Yes inflammation of left lower extremity E11.622 Type 2 diabetes mellitus with other skin ulcer 12/28/2017 No Yes L97.211 Non-pressure chronic ulcer of right calf limited to breakdown 02/22/2018 Yes Yes of skin Status Complications Interventions Improving leg is better now back in compression wraps. One open continue's overall Medical wound on the posterior calf [place] under Decision compression wraps Making : and compression pumps Allaire, Weylyn A. ([id number]) Inactive Problems Resolved Problems Electronic Signature(s) Signed: 03/29/2018 6:17:43 PM By: Linton Ham MD Entered By: Linton Ham on 03/29/2018 08:31:42 Trebilcock, Colden A. ([id number]) -------------------------------------------------------------------------------- Progress Note Details Patient Name: Efaw, Gerasimos A. Date of Service: 03/29/2018 8:00 AM Medical Record Number: [id number] Patient Account Number: [id number] Date of Birth/Sex: 10/21/1970 (47 y.o. M) Treating [name]: Cornell Barman Primary Care Provider: Gaetano Net Other Clinician: Referring Provider: Gaetano Net Treating Provider/Extender: Tito Dine in Treatment: 13 Subjective History of Present Illness (HPI) 12/28/17; this is a now 47 year old man who is a type II diabetic. He was hospitalized from 10/01/17 through 10/19/17. He had an MSSA soft tissue and skin infection. 2 open areas on the left leg were identified he has a smaller area on the left medial calf superiorly just below the knee and a wound just above the left ankle on the posterior medial aspect. I think both of these were surgical IandD sites when he was in the hospital. He was discharged with a wound VAC at that point however this is since been taken off. He follows with Dr.  Ola Spurr for the [facility] and he is still on chronic Keflex at 500 twice a day. At that time he was hospitalized his hemoglobin A1c was 15.1 however if I'm reading his endocrinologist notes correctly that is improved. He has been following with Dr. [name] at vein and vascular and he has been applying calcium alginate and Unna boots. He has home health changing the dressing. They have also been attempting to get him external compression pumps although the patient is unaware whether they've been approved by insurance at this point. as mentioned he has a smaller clean wound on the right lateral calf just below the knee and he has a much larger area just above the left ankle medially and posteriorly. Our intake nurse reported greenish purulent looking drainage.the patient did have surgical material sent to pathology in February. This showed chronic abscess The patient also has lymphedema stage III in the left greater than right lower extremities. He has a history of blisters with wounds but these of all were always healed. The patient thinks that the lymphedema may have been present since he was about 47 years old i.e. about 30 years. He does not have graded pressure stockings and has not worn stockings. He does not have a distant history of DVT PE or phlebitis. He has not been systemically unwell fever no chills. He states that his Lasix is recently been reduced. He tells me his kidney function is at "30%" and  he has been followed by Dr. [name] of nephrology. At one point he was on Lasix 80 twice a day however that's been cut back and he is now on Lasix at 20 twice a day. The patient has a history of PAD listed in his records although he comes from Dr. [name] I don't think is felt to have significant PAD. ABIs in our clinic were noncompressible bilaterally. 01/04/18; patient has a large wound on the left lateral lower calf and a small wound on the left medial upper calf. He has been to see his  nephrologist who changed him to Demadex 40 mg a day. I'm hopeful this will help with his systemic fluid overload. He also has stage III lymphedema. Really no change in the 2 wounds since last week 01/11/18; the patient is down 13 pounds. He put his stage III lymphedema left leg in 4K compression last week and there is less edema fluid however we still haven't been able to communicate with home health but [facility] but the dressings have not been changed. The patient noted an odor last week. He is also had compression pumps ordered by [facility] this as a not completed the paperwork stage. 01/18/18; patient continues to lose weight. Stage III lymphedema in the left greater than right leg under for alert compression. The major wound is on the left lateral ankle area. He apparently has bilateral compression pumps being brought to his house, these were ordered by [facility] Notable for the fact today he had some blisters on the right anterior leg together with some skin nodules. This is no doubt secondary to severe lymphedema. 01/25/18; the patient has obtained his compression pumps and is using them per vein and vascular instructions 3 times a day for an hour. He also saw [name]. He was felt to have venous insufficiency but did not suggest any intervention also improved edema. It was suggested that he have compression stockings 20-30 mm on a daily basis in addition to compression pumps. The patient arrives in clinic today with a layer of unna under for layer compression. he seems to have some trouble with the degree of compression. He has open areas on the left lateral ankle area which is his major wound left upper medial calf and a Nohr, Roston A. ([id number]) superficial open area on the right anterior shin area which was blistered last week. He has skin changes on the right anterior calf which I think are no doubt secondary to  lymphedema skin nodules etc. 02/01/18; the patient comes in telling us his nephrologist have to his torsemide. Unfortunately today he is put on 7 pounds by our scales. He has blisters all over the anterior and medial part of his right calf and a new open wound. He also has soupy green drainage coming out of the left lateral calf /ankle wound. 02/08/18; culture I did last week of the left lateral ankle wound grew both Pseudomonas and Morganella. He is on Keflex from Dr. Ola Spurr in the hospital. I will need to review these notes.in any case Keflex is not going to cover these 2 organisms. I'm probably going to added ciprofloxacin today for 1 week. A lot of drainage that looks purulent last week. He is not complaining of pain however he has managed to put on 10 pounds in 2 weeks by our scales in this clinic. He is going to see his nephrologist tomorrow 02/15/18; he completed the ciprofloxacin I  gave him last week. Notable that he is up to 379 pounds today which is up 16 pounds from 2 weeks ago. He is complaining of orthopnea but doesn't have any chest pain. 02/22/18; he continues to have weight gain. R intake nurse reports again purulent green drainage coming out of the lateral wound on the lateral left calf. He has small open area on the right anterior leg. His torsemide was increased to 2 tablets a day I believe this is 40 mg last week in response to the call admitted to Dr. Keturah Barre office. He follows up with Dr. [name] and Dr. Delana Meyer tomorrow 03/01/18 his weight essentially stable today at 383 pounds. Drainage out of the left lateral wound on the lower left calf/ankle is a lot less. Culture last time grew Pseudomonas. I put him on cefdinir. He has been to Dr. Keturah Barre office no adjustments in his diuretics. Dr. Nicoletta Dress prescribed a wraparound stocking for the right leg.there is no open area on the right leg. He has a superficial area on the left medial calf, left posterior calf and in the large area on  the left lateral however this looks better 03/15/18; weight is not up to 393 pounds. He saw his nephrologist yesterday Dr. [name] will increase the Demadex I'm hopeful this will help with the edema control. I'm using silver alginate to all his wounds. In particular the left lateral ankle looks better. Unfortunately he has new open areas on the right lateral calf that will include use of his compression stocking at least in the short to medium term. He has new wounds o3 on the right lateral calf. One of these has some size however all numerous superficial 03/22/18; his weight is stabilized a bit. Just adjustment of his diuretics by his nephrologist. There is no doubt he has some degree of systemic fluid overload on top of severe left greater than right lymphedema. 2 weeks ago tried to transition him to stockings on the right leg however he developed re-breakdown of skin on the right leg and we had to put him back in compression last week. He also uses external compression pumps and claims to be compliant Continued concern about his depression today 03/29/18; several ongoing issues with this patient; He no longer has home health coverage apparently secondary to a lapse in insurance. He is apparently transitioning from short for long-term disability. [facility] was changing his compression wraps on Monday and Friday. He has gained 10 pounds since last week Sees Dr. [name] tomorrow Saw his primary doctor last week about the depression. It sounds as though he declined pharmacologic management. He seems somewhat better today. We were concerned last week when he came. Somewhat better today He is using his compression pumps once a day at home, I have asked for twice a day if possible especially on the left leg Follows up with his nephrologist in mid-August. He is managing his diuretic for I think stage IV chronic renal failure Paradoxically his wounds actually look  better Objective Constitutional Patient is hypertensive.. Pulse regular and within target range for patient.[name] Kitchen Respirations regular, non-labored and within target range.. Temperature is normal and within the target range for the patient.[name] Kitchen appears in no distress. Vitals Time Taken: 8:02 AM, Height: 76 in, Weight: 379.5 lbs, BMI: 46.2, Temperature: 98.2 F, Pulse: 64 bpm, Respiratory Rate: 18 breaths/min, Blood Pressure: 196/78 mmHg. General Notes: Made Dr. Dellia Nims aware of BP. Heyne, Langford A. ([id number]) Respiratory Respiratory effort is easy and symmetric bilaterally. Rate is normal  at rest and on room air.. Cardiovascular through the lymphedema his pedal pulses are palpable at the dorsalis pedis. Edema present in both extremities.I think this actually looks better. He always has more lymphedema left greater than right. Psychiatric Patient appears depressed today however better than last week. General Notes: wound exam His large wound on the lateral calf just above the ankle actually is smaller. Using an open curet I removed nonviable necrotic tissue from the wound bed and slept from the wound circumference. Hemostasis with direct pressure. He has a healthy-looking wound here. The only other wound I can see today is on the right posterior calf this is superficial. Integumentary (Hair, Skin) Wound #2 status is Open. Original cause of wound was Gradually Appeared. The wound is located on the Left,Lateral,Posterior Lower Leg. The wound measures 2cm length x 5.5cm width x 0.7cm depth; 8.639cm^2 area and 6.048cm^3 volume. There is no tunneling or undermining noted. There is a large amount of serosanguineous drainage noted. The wound margin is distinct with the outline attached to the wound base. There is small (1-33%) red granulation within the wound bed. There is a large (67-100%) amount of necrotic tissue within the wound bed including Adherent Slough. Periwound temperature was noted as No  Abnormality. The periwound has tenderness on palpation. Wound #7 status is Healed - Epithelialized. Original cause of wound was Gradually Appeared. The wound is located on the Left,Proximal,Medial Lower Leg. The wound measures 0cm length x 0cm width x 0cm depth; 0cm^2 area and 0cm^3 volume. Wound #9 status is Open. Original cause of wound was Gradually Appeared. The wound is located on the Right,Posterior Lower Leg. The wound measures 2.2cm length x 1.5cm width x 0.1cm depth; 2.592cm^2 area and 0.259cm^3 volume. There is no tunneling or undermining noted. There is a large amount of serous drainage noted. The wound margin is distinct with the outline attached to the wound base. There is medium (34-66%) red granulation within the wound bed. There is a medium (34- 66%) amount of necrotic tissue within the wound bed including Adherent Slough. Periwound temperature was noted as No Abnormality. The periwound has tenderness on palpation. Assessment Active Problems ICD-10 Non-pressure chronic ulcer of left calf with necrosis of muscle Lymphedema, not elsewhere classified Chronic venous hypertension (idiopathic) with ulcer and inflammation of left lower extremity Type 2 diabetes mellitus with other skin ulcer Non-pressure chronic ulcer of right calf limited to breakdown of skin Procedures Wound #2 Vallas, Robbi A. ([id number]) Pre-procedure diagnosis of Wound #2 is a Diabetic Wound/Ulcer of the Lower Extremity located on the Left,Lateral,Posterior Lower Leg .Severity of Tissue Pre Debridement is: Fat layer exposed. There was a Excisional Skin/Subcutaneous Tissue Debridement with a total area of 8 sq cm performed by Ricard Dillon, MD. With the following instrument(s): Curette to remove Viable and Non-Viable tissue/material. Material removed includes Subcutaneous Tissue and Slough and after achieving pain control using Other (lidocaoine 4%). No specimens were taken. A time out was conducted at 08:23,  prior to the start of the procedure. A Moderate amount of bleeding was controlled with Pressure. The procedure was tolerated well. Patient s Level of Consciousness post procedure was recorded as Awake and Alert. Post Debridement Measurements: 2cm length x 5.5cm width x 0.7cm depth; 6.048cm^3 volume. Character of Wound/Ulcer Post Debridement is stable. Severity of Tissue Post Debridement is: Fat layer exposed. Post procedure Diagnosis Wound #2: Same as Pre-Procedure Plan Wound Cleansing: Wound #2 Left,Lateral,Posterior Lower Leg: Cleanse wound with mild soap and water Wound #9 Right,Posterior Lower  Leg: Cleanse wound with mild soap and water Anesthetic (add to Medication List): Wound #2 Left,Lateral,Posterior Lower Leg: Topical Lidocaine 4% cream applied to wound bed prior to debridement (In Clinic Only). Wound #9 Right,Posterior Lower Leg: Topical Lidocaine 4% cream applied to wound bed prior to debridement (In Clinic Only). Skin Barriers/Peri-Wound Care: Wound #2 Left,Lateral,Posterior Lower Leg: Moisturizing lotion - to legs. Wound #9 Right,Posterior Lower Leg: Moisturizing lotion - to legs. Primary Wound Dressing: Wound #2 Left,Lateral,Posterior Lower Leg: Silver Alginate Wound #9 Right,Posterior Lower Leg: Silver Alginate Secondary Dressing: Wound #2 Left,Lateral,Posterior Lower Leg: ABD pad XtraSorb Wound #9 Right,Posterior Lower Leg: ABD pad XtraSorb Dressing Change Frequency: Wound #2 Left,Lateral,Posterior Lower Leg: Change Dressing Monday, Wednesday, Friday - Wednesday in clinic. Change Dressing Monday, Wednesday, Friday - Wednesday in clinic. Wound #9 Right,Posterior Lower Leg: Change Dressing Monday, Wednesday, Friday - Wednesday in clinic. Change Dressing Monday, Wednesday, Friday - Wednesday in clinic. Follow-up Appointments: Wound #2 Left,Lateral,Posterior Lower Leg: Return Appointment in 1 week. Wound #9 Right,Posterior Lower Leg: Return Appointment in 1  week. Edema Control: Wound #2 Left,Lateral,Posterior Lower Leg: Donlan, Jibran A. ([id number]) 4 Layer Compression System - Bilateral Elevate legs to the level of the heart and pump ankles as often as possible Compression Pump: Use compression pump on left lower extremity for 30 minutes, twice daily. - 1 hour twice daily Compression Pump: Use compression pump on right lower extremity for 30 minutes, twice daily. - 1 hour twice daily Wound #9 Right,Posterior Lower Leg: 4 Layer Compression System - Bilateral Elevate legs to the level of the heart and pump ankles as often as possible Compression Pump: Use compression pump on left lower extremity for 30 minutes, twice daily. - 1 hour twice daily Compression Pump: Use compression pump on right lower extremity for 30 minutes, twice daily. - 1 hour twice daily Home Health: Wound #2 Left,Lateral,Posterior Lower Leg: Continue Home Health Visits - Monday and Friday Home Health Nurse may visit PRN to address patient s wound care needs. FACE TO FACE ENCOUNTER: MEDICARE and MEDICAID PATIENTS: I certify that this patient is under my care and that I had a face-to-face encounter that meets the physician face-to-face encounter requirements with this patient on this date. The encounter with the patient was in whole or in part for the following MEDICAL CONDITION: (primary reason for [place]) MEDICAL NECESSITY: I certify, that based on my findings, NURSING services are a medically necessary home health service. HOME BOUND STATUS: I certify that my clinical findings support that this patient is homebound (i.e., Due to illness or injury, pt requires aid of supportive devices such as crutches, cane, wheelchairs, walkers, the use of special transportation or the assistance of another person to leave their place of residence. There is a normal inability to leave the home and doing so requires considerable and taxing effort. Other absences are for medical reasons  / religious services and are infrequent or of short duration when for other reasons). If current dressing causes regression in wound condition, may D/C ordered dressing product/s and apply Normal Saline Moist Dressing daily until next [place] / Other MD appointment. [place] of regression in wound condition at [phone]. Wound #9 Right,Posterior Lower Leg: [place] Visits - Monday and Friday Home Health Nurse may visit PRN to address patient s wound care needs. FACE TO FACE ENCOUNTER: MEDICARE and MEDICAID PATIENTS: I certify that this patient is under my care and that I had a face-to-face encounter that meets the  physician face-to-face encounter requirements with this patient on this date. The encounter with the patient was in whole or in part for the following MEDICAL CONDITION: (primary reason for [place]) MEDICAL NECESSITY: I certify, that based on my findings, NURSING services are a medically necessary home health service. HOME BOUND STATUS: I certify that my clinical findings support that this patient is homebound (i.e., Due to illness or injury, pt requires aid of supportive devices such as crutches, cane, wheelchairs, walkers, the use of special transportation or the assistance of another person to leave their place of residence. There is a normal inability to leave the home and doing so requires considerable and taxing effort. Other absences are for medical reasons / religious services and are infrequent or of short duration when for other reasons). If current dressing causes regression in wound condition, may D/C ordered dressing product/s and apply Normal Saline Moist Dressing daily until next [place] / Other MD appointment. [place] of regression in wound condition at [phone]. Medical Decision Making Non-pressure chronic ulcer of left calf with necrosis of muscle 12/28/2017 Status:  Improving Complications: wound bed looks better and dimensions are better. Interventions: continue silver alternate under compression -debridement of the wound done Lymphedema, not elsewhere classified 12/28/2017 Status: No change Complications: he has gained 10 pounds up to 399 pounds since last week Interventions: patient sees Dr. Hinton Lovely tomorrow I've asked him to try to use his external compression pumps twice a day Non-pressure chronic ulcer of right calf limited to breakdown of skin 02/22/2018 Status: Improving Complications: leg is better now back in compression wraps. One open wound on the posterior calf Interventions: continue's overall [place] under compression wraps and compression pumps #1 in spite of the weight gain and his wounds look better and the edema in his legs looks better as well Molyneux, Graves A. ([id number]) #2 nevertheless I have suggested twice a day external compression pump use at home #3 we are going to wrap his legs today with the intent of the staying in place. If he needs to remove these then he can use his juxta light stockings or call us for a nurse visit. #4 continue silver alginate as the primary wound dressing. we are making really good progress #5his depression is still present but seems less acute/concerning than last week. 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 Date of Service: 03/29/2018 Medical Record Number: [id number] Patient Account Number: [id number] Date of Birth/Sex: Jan 20, 1971 (47 y.o. M) Treating [name]: Cornell Barman Primary Care Provider: Gaetano Net Other Clinician: Referring Provider: Gaetano Net Treating Provider/Extender: Tito Dine in Treatment: 13 Diagnosis Coding ICD-10 Codes Code Description [phone] Non-pressure chronic ulcer of left calf with necrosis of muscle I89.0 Lymphedema, not elsewhere classified I87.332 Chronic venous hypertension (idiopathic) with ulcer and inflammation of left lower extremity E11.622 Type 2 diabetes mellitus with other skin ulcer L97.211 Non-pressure chronic ulcer of right calf limited to breakdown of skin Facility Procedures CPT4 Code: [id number] Description: 11042 - DEB SUBQ TISSUE 20 SQ CM/< ICD-10 Diagnosis Description L97.223 Non-pressure chronic ulcer of left calf with necrosis of mu Modifier: scle Quantity: 1 Physician Procedures CPT4 Code: [id number] Description: 11042 - WC PHYS SUBQ TISS 20 SQ CM ICD-10 Diagnosis Description L97.223 Non-pressure chronic ulcer of left calf with necrosis of mu Modifier: scle Quantity: 1  Electronic Signature(s) Signed: 03/29/2018 6:17:43 PM By: Linton Ham MD Entered By: Linton Ham on 03/29/2018 08:41:28

## 2018-04-12 ENCOUNTER — Ambulatory Visit: Payer: Self-pay | Admitting: Nurse Practitioner

## 2018-04-12 NOTE — Progress Notes (Signed)
[name], [name] ([id number]) Visit Report for 03/29/2018 Arrival Information Details Patient Name: [name], [name] Date of Service: 03/29/2018 8:00 AM Medical Record Number: [id number] Patient Account Number: [id number] Date of Birth/Sex: [birth year] (47 y.o. M) Treating RN: Ahmed Prima Primary Care Laiklyn Pilkenton: Gaetano Net Other Clinician: Referring [place]: Gaetano Net Treating Rosalie Gelpi/Extender: Tito Dine in Treatment: 13 Visit Information History Since Last Visit All ordered tests and consults were completed: No Patient Arrived: Ambulatory Added or deleted any medications: No Arrival Time: 08:02 Any new allergies or adverse reactions: No Accompanied By: self Had a fall or experienced change in No Transfer Assistance: None activities of daily living that may affect Patient Identification Verified: Yes risk of falls: Secondary Verification Process Yes Signs or symptoms of abuse/neglect since last visito No Completed: Hospitalized since last visit: No Patient Requires Transmission-Based No Implantable device outside of the clinic excluding No Precautions: cellular tissue based products placed in the center Patient Has Alerts: Yes since last visit: Patient Alerts: DM II Has Dressing in Place as Prescribed: Yes noncompressible Has Compression in Place as Prescribed: Yes bilateral Pain Present Now: No Electronic Signature(s) Signed: 04/03/2018 4:59:33 PM By: Alric Quan Entered By: Alric Quan on 03/29/2018 08:02:51 Daft, Stoy A. ([id number]) -------------------------------------------------------------------------------- Encounter Discharge Information Details Patient Name: [name], [name] Date of Service: 03/29/2018 8:00 AM Medical Record Number: [id number] Patient Account Number: [id number] Date of Birth/Sex: Jan 22, 1971 (47 y.o. M) Treating RN: Montey Hora Primary Care Saliha Salts: Gaetano Net Other Clinician: Referring Clive Parcel: Gaetano Net Treating Usher Hedberg/Extender: Tito Dine in Treatment: 29 Encounter Discharge Information Items Discharge Condition: Stable Ambulatory Status: Ambulatory Discharge Destination: Home Transportation: Private Auto Accompanied By: self Schedule Follow-up Appointment: Yes Clinical Summary of Care: Electronic Signature(s) Signed: 03/29/2018 10:59:52 AM By: Montey Hora Entered By: Montey Hora on 03/29/2018 10:59:51 Hird, Dauntae A. ([id number]) -------------------------------------------------------------------------------- Lower Extremity Assessment Details Patient Name: [name], [name] Date of Service: 03/29/2018 8:00 AM Medical Record Number: [id number] Patient Account Number: [id number] Date of Birth/Sex: [birth year] (47 y.o. M) Treating RN: Ahmed Prima Primary Care Harish Bram: Gaetano Net Other Clinician: Referring Annison Birchard: Gaetano Net Treating Ariba Lehnen/Extender: Tito Dine in Treatment: 13 Edema Assessment Assessed: [Left: No] [Right: No] Edema: [Left: Yes] [Right: Yes] Calf Left: Right: Point of Measurement: 44 cm From Medial Instep 65.5 cm 56.6 cm Ankle Left: Right: Point of Measurement: 14 cm From Medial Instep 56 cm 40.5 cm Vascular Assessment Pulses: Dorsalis Pedis Palpable: [Left:No] [Right:Yes] Doppler Audible: [Left:Yes] Posterior Tibial Extremity colors, hair growth, and conditions: Extremity Color: [Left:Red] [Right:Red] Temperature of Extremity: [Left:Warm] [Right:Warm] Toe Nail Assessment Left: Right: Thick: Yes Yes Discolored: Yes Yes Deformed: No No Improper Length and Hygiene: Yes Yes Electronic Signature(s) Signed: 04/03/2018 4:59:33 PM By: Alric Quan Entered By: Alric Quan on 03/29/2018 08:11:11 Pentland, Darrie A. ([id number]) -------------------------------------------------------------------------------- Multi Wound Chart Details Patient Name: [name], [name] Date of Service: 03/29/2018 8:00  AM Medical Record Number: [id number] Patient Account Number: [id number] Date of Birth/Sex: [birth year] (47 y.o. M) Treating RN: Cornell Barman Primary Care Solara Goodchild: Gaetano Net Other Clinician: Referring Emilija Bohman: Gaetano Net Treating Shareka Casale/Extender: Tito Dine in Treatment: 13 Vital Signs Height(in): 76 Pulse(bpm): 64 Weight(lbs): 379.5 Blood Pressure(mmHg): 196/78 Body Mass Index(BMI): 46 Temperature(F): 98.2 Respiratory Rate 18 (breaths/min): Photos: [2:No Photos] [7:No Photos] [9:No Photos] Wound Location: [2:Left Lower Leg - Lateral, Posterior] [7:Left, Proximal, Medial Lower Leg] [9:Right Lower Leg - Posterior] Wounding Event: [2:Gradually Appeared] [7:Gradually Appeared] [9:Gradually Appeared] Primary Etiology: [2:Diabetic Wound/Ulcer of the Lower Extremity] [7:Lymphedema] [  9:Lymphedema] Secondary Etiology: [2:Lymphedema] [7:N/A] [9:Diabetic Wound/Ulcer of the Lower Extremity] Comorbid History: [2:Hypertension, Type II Diabetes, Neuropathy] [7:N/A] [9:Hypertension, Type II Diabetes, Neuropathy] Date Acquired: [2:09/30/2017] [7:02/15/2018] [9:03/08/2018] Weeks of Treatment: [2:13] [7:6] [9:2] Wound Status: [2:Open] [7:Healed - Epithelialized] [9:Open] Measurements L x W x D [2:2x5.5x0.7] [7:0x0x0] [9:2.2x1.5x0.1] (cm) Area (cm) : [2:8.639] [7:0] [9:2.592] Volume (cm) : [2:6.048] [7:0] [9:0.259] % Reduction in Area: [2:69.40%] [7:100.00%] [9:75.20%] % Reduction in Volume: [2:46.50%] [7:100.00%] [9:75.20%] Classification: [2:Grade 2] [7:Full Thickness Without Exposed Support Structures] [9:Full Thickness Without Exposed Support Structures] Exudate Amount: [2:Large] [7:N/A] [9:Large] Exudate Type: [2:Serosanguineous] [7:N/A] [9:Serous] Exudate Color: [2:red, brown] [7:N/A] [9:amber] Wound Margin: [2:Distinct, outline attached] [7:N/A] [9:Distinct, outline attached] Granulation Amount: [2:Small (1-33%)] [7:N/A] [9:Medium (34-66%)] Granulation Quality: [2:Red]  [7:N/A] [9:Red] Necrotic Amount: [2:Large (67-100%)] [7:N/A] [9:Medium (34-66%)] Exposed Structures: [2:Fascia: No Fat Layer (Subcutaneous Tissue) Exposed: No Tendon: No Muscle: No Joint: No Bone: No] [7:N/A] [9:N/A] Epithelialization: [2:None] [7:N/A] [9:None] Debridement: [2:Debridement - Excisional] [7:N/A] [9:N/A] Pre-procedure 08:23 N/A N/A Verification/Time Out Taken: Pain Control: Other N/A N/A Tissue Debrided: Subcutaneous, Slough N/A N/A Level: Skin/Subcutaneous Tissue N/A N/A Debridement Area (sq cm): 8 N/A N/A Instrument: Curette N/A N/A Bleeding: Moderate N/A N/A Hemostasis Achieved: Pressure N/A N/A Debridement Treatment Procedure was tolerated well N/A N/A Response: Post Debridement 2x5.5x0.7 N/A N/A Measurements L x W x D (cm) Post Debridement Volume: 6.048 N/A N/A (cm) Periwound Skin Texture: No Abnormalities Noted No Abnormalities Noted No Abnormalities Noted Periwound Skin Moisture: No Abnormalities Noted No Abnormalities Noted No Abnormalities Noted Periwound Skin Color: No Abnormalities Noted No Abnormalities Noted No Abnormalities Noted Temperature: No Abnormality N/A No Abnormality Tenderness on Palpation: Yes No Yes Wound Preparation: Ulcer Cleansing: N/A Ulcer Cleansing: Rinsed/Irrigated with Saline Rinsed/Irrigated with Saline Topical Anesthetic Applied: Topical Anesthetic Applied: Other: lidocaine 4% Other: lidocaine 4% Procedures Performed: Debridement N/A N/A Treatment Notes Electronic Signature(s) Signed: 03/29/2018 6:17:43 PM By: Linton Ham MD Entered By: Linton Ham on 03/29/2018 08:34:02 Atteberry, Kenton Vale ([id number]) -------------------------------------------------------------------------------- Multi-Disciplinary Care Plan Details Patient Name: Staley, Jahking A. Date of Service: 03/29/2018 8:00 AM Medical Record Number: [id number] Patient Account Number: [id number] Date of Birth/Sex: 06/10/1971 (47 y.o. M) Treating RN: Cornell Barman Primary Care Saanvi Hakala: Gaetano Net Other Clinician: Referring Davis Vannatter: Gaetano Net Treating Sedona Wenk/Extender: Tito Dine in Treatment: 13 Active Inactive ` Abuse / Safety / Falls / Self Care Management Nursing Diagnoses: Potential for falls Goals: Patient will remain injury free related to falls Date Initiated: 12/28/2017 Target Resolution Date: 04/08/2018 Goal Status: Active Interventions: Assess fall risk on admission and as needed Notes: ` Nutrition Nursing Diagnoses: Impaired glucose control: actual or potential Goals: Patient/caregiver verbalizes understanding of need to maintain therapeutic glucose control per primary care physician Date Initiated: 12/28/2017 Target Resolution Date: 04/08/2018 Goal Status: Active Interventions: Provide education on elevated blood sugars and impact on wound healing Notes: ` Orientation to the Wound Care Program Nursing Diagnoses: Knowledge deficit related to the wound healing center program Goals: Patient/caregiver will verbalize understanding of the [facility] Date Initiated: 12/28/2017 Target Resolution Date: 04/08/2018 Goal Status: Active Interventions: Ledlow, Delmont A. ([id number]) Provide education on orientation to the wound center Notes: ` Venous Leg Ulcer Nursing Diagnoses: Knowledge deficit related to disease process and management Goals: Patient will maintain optimal edema control Date Initiated: 03/27/2018 Target Resolution Date: 04/27/2018 Goal Status: Active Interventions: Assess peripheral edema status every visit. Compression as ordered Treatment Activities: Therapeutic compression applied : 03/22/2018 Notes: ` Wound/Skin Impairment Nursing Diagnoses: Impaired tissue integrity  Goals: Ulcer/skin breakdown will heal within 14 weeks Date Initiated: 12/28/2017 Target Resolution Date: 04/08/2018 Goal Status: Active Interventions: Assess patient/caregiver ability to obtain  necessary supplies Assess patient/caregiver ability to perform ulcer/skin care regimen upon admission and as needed Assess ulceration(s) every visit Notes: Electronic Signature(s) Signed: 03/31/2018 6:17:48 PM By: Gretta Cool, BSN, RN, CWS, Kim RN, BSN Entered By: Gretta Cool, BSN, RN, CWS, Kim on 03/29/2018 08:19:23 Bachtell, Romar A. ([id number]) -------------------------------------------------------------------------------- Pain Assessment Details Patient Name: Riddell, Ashford A. Date of Service: 03/29/2018 8:00 AM Medical Record Number: [id number] Patient Account Number: [id number] Date of Birth/Sex: Oct 09, 1970 (47 y.o. M) Treating RN: Ahmed Prima Primary Care Avon Mergenthaler: Gaetano Net Other Clinician: Referring Cristan Hout: Gaetano Net Treating Ryhanna Dunsmore/Extender: Tito Dine in Treatment: 13 Active Problems Location of Pain Severity and Description of Pain Patient Has Paino No Site Locations Pain Management and Medication Current Pain Management: Electronic Signature(s) Signed: 04/03/2018 4:59:33 PM By: Alric Quan Entered By: Alric Quan on 03/29/2018 08:02:56 Chaudhari, Franki A. ([id number]) -------------------------------------------------------------------------------- Patient/Caregiver Education Details Patient Name: Belitz, Maor A. Date of Service: 03/29/2018 8:00 AM Medical Record Number: [id number] Patient Account Number: [id number] Date of Birth/Gender: 04-16-1971 (47 y.o. M) Treating RN: Montey Hora Primary Care Physician: Gaetano Net Other Clinician: Referring Physician: Gaetano Net Treating Physician/Extender: Tito Dine in Treatment: 13 Education Assessment Education Provided To: Patient Education Topics Provided Venous: Handouts: Other: continue using pumps Methods: Explain/Verbal Responses: State content correctly Electronic Signature(s) Signed: 03/29/2018 5:29:24 PM By: Montey Hora Entered By: Montey Hora on 03/29/2018  11:00:14 Mun, Plez A. ([id number]) -------------------------------------------------------------------------------- Wound Assessment Details Patient Name: Sexson, Lenorris A. Date of Service: 03/29/2018 8:00 AM Medical Record Number: [id number] Patient Account Number: [id number] Date of Birth/Sex: 1970-12-19 (47 y.o. M) Treating RN: Ahmed Prima Primary Care Delayni Streed: Gaetano Net Other Clinician: Referring Shriya Aker: Gaetano Net Treating Aleena Kirkeby/Extender: Tito Dine in Treatment: 13 Wound Status Wound Number: 2 Primary Etiology: Diabetic Wound/Ulcer of the Lower Extremity Wound Location: Left Lower Leg - Lateral, Posterior Secondary Lymphedema Wounding Event: Gradually Appeared Etiology: Date Acquired: 09/30/2017 Wound Status: Open Weeks Of Treatment: 13 Comorbid History: Hypertension, Type II Diabetes, Clustered Wound: No Neuropathy Photos Photo Uploaded By: Alric Quan on 03/30/2018 07:42:16 Wound Measurements Length: (cm) 2 Width: (cm) 5.5 Depth: (cm) 0.7 Area: (cm) 8.639 Volume: (cm) 6.048 % Reduction in Area: 69.4% % Reduction in Volume: 46.5% Epithelialization: None Tunneling: No Undermining: No Wound Description Classification: Grade 2 Wound Margin: Distinct, outline attached Exudate Amount: Large Exudate Type: Serosanguineous Exudate Color: red, brown Foul Odor After Cleansing: No Slough/Fibrino Yes Wound Bed Granulation Amount: Small (1-33%) Exposed Structure Granulation Quality: Red Fascia Exposed: No Necrotic Amount: Large (67-100%) Fat Layer (Subcutaneous Tissue) Exposed: No Necrotic Quality: Adherent Slough Tendon Exposed: No Muscle Exposed: No Joint Exposed: No Bone Exposed: No Periwound Skin Texture Lehew, Keric A. ([id number]) Texture Color No Abnormalities Noted: No No Abnormalities Noted: No Moisture Temperature / Pain No Abnormalities Noted: No Temperature: No Abnormality Tenderness on Palpation: Yes Wound  Preparation Ulcer Cleansing: Rinsed/Irrigated with Saline Topical Anesthetic Applied: Other: lidocaine 4%, Treatment Notes Wound #2 (Left, Lateral, Posterior Lower Leg) 1. Cleansed with: Clean wound with Normal Saline Cleanse wound with antibacterial soap and water 2. Anesthetic Topical Lidocaine 4% cream to wound bed prior to debridement 3. Peri-wound Care: Moisturizing lotion 4. Dressing Applied: Calcium Alginate with Silver 5. Secondary Dressing Applied ABD Pad 7. Secured with 4 Layer Compression System - Bilateral Notes silvercel Electronic Signature(s) Signed: 04/03/2018 4:59:33 PM By: Alric Quan Entered By: Carolyne Fiscal,  Debra on 03/29/2018 08:12:00 Daubert, Arael A. ([id number]) -------------------------------------------------------------------------------- Wound Assessment Details Patient Name: Schoening, Kohen A. Date of Service: 03/29/2018 8:00 AM Medical Record Number: [id number] Patient Account Number: [id number] Date of Birth/Sex: 1970-09-25 (47 y.o. M) Treating RN: Ahmed Prima Primary Care Mikayah Joy: Gaetano Net Other Clinician: Referring Earl Zellmer: Gaetano Net Treating Shone Leventhal/Extender: Tito Dine in Treatment: 13 Wound Status Wound Number: 7 Primary Etiology: Lymphedema Wound Location: Left, Proximal, Medial Lower Leg Wound Status: Healed - Epithelialized Wounding Event: Gradually Appeared Date Acquired: 02/15/2018 Weeks Of Treatment: 6 Clustered Wound: No Photos Photo Uploaded By: Alric Quan on 03/30/2018 07:43:41 Wound Measurements Length: (cm) 0 Width: (cm) 0 Depth: (cm) 0 Area: (cm) 0 Volume: (cm) 0 % Reduction in Area: 100% % Reduction in Volume: 100% Wound Description Full Thickness Without Exposed Support Classification: Structures Periwound Skin Texture Texture Color No Abnormalities Noted: No No Abnormalities Noted: No Moisture No Abnormalities Noted: No Electronic Signature(s) Signed: 04/03/2018 4:59:33 PM  By: Alric Quan Entered By: Alric Quan on 03/29/2018 08:12:13 Rinke, Kirklin A. ([id number]) -------------------------------------------------------------------------------- Wound Assessment Details Patient Name: Saiz, Hillary A. Date of Service: 03/29/2018 8:00 AM Medical Record Number: [id number] Patient Account Number: [id number] Date of Birth/Sex: [birth year] (47 y.o. M) Treating RN: Ahmed Prima Primary Care Kylor Valverde: Gaetano Net Other Clinician: Referring Davionne Dowty: Gaetano Net Treating Jo Booze/Extender: Tito Dine in Treatment: 13 Wound Status Wound Number: 9 Primary Etiology: Lymphedema Wound Location: Right Lower Leg - Posterior Secondary Diabetic Wound/Ulcer of the Lower Etiology: Extremity Wounding Event: Gradually Appeared Wound Status: Open Date Acquired: 03/08/2018 Comorbid History: Hypertension, Type II Diabetes, Weeks Of Treatment: 2 Neuropathy Clustered Wound: No Photos Photo Uploaded By: Alric Quan on 03/30/2018 07:43:42 Wound Measurements Length: (cm) 2.2 Width: (cm) 1.5 Depth: (cm) 0.1 Area: (cm) 2.592 Volume: (cm) 0.259 % Reduction in Area: 75.2% % Reduction in Volume: 75.2% Epithelialization: None Tunneling: No Undermining: No Wound Description Full Thickness Without Exposed Support Classification: Structures Wound Margin: Distinct, outline attached Exudate Large Amount: Exudate Type: Serous Exudate Color: amber Foul Odor After Cleansing: No Slough/Fibrino Yes Wound Bed Granulation Amount: Medium (34-66%) Granulation Quality: Red Necrotic Amount: Medium (34-66%) Necrotic Quality: Adherent Slough Periwound Skin Texture Texture Color No Abnormalities Noted: No No Abnormalities Noted: No Kiely, Leiam A. ([id number]) Moisture Temperature / Pain No Abnormalities Noted: No Temperature: No Abnormality Tenderness on Palpation: Yes Wound Preparation Ulcer Cleansing: Rinsed/Irrigated with Saline Topical  Anesthetic Applied: Other: lidocaine 4%, Treatment Notes Wound #9 (Right, Posterior Lower Leg) 1. Cleansed with: Clean wound with Normal Saline Cleanse wound with antibacterial soap and water 2. Anesthetic Topical Lidocaine 4% cream to wound bed prior to debridement 3. Peri-wound Care: Moisturizing lotion 4. Dressing Applied: Calcium Alginate with Silver 5. Secondary Dressing Applied ABD Pad 7. Secured with 4 Layer Compression System - Bilateral Notes silvercel Electronic Signature(s) Signed: 04/03/2018 4:59:33 PM By: Alric Quan Entered By: Alric Quan on 03/29/2018 08:13:24 Kavanagh, Jazper A. ([id number]) -------------------------------------------------------------------------------- Vitals Details Patient Name: Appleman, Rayaan A. Date of Service: 03/29/2018 8:00 AM Medical Record Number: [id number] Patient Account Number: [id number] Date of Birth/Sex: June 29, 1971 (47 y.o. M) Treating RN: Ahmed Prima Primary Care Savreen Gebhardt: Gaetano Net Other Clinician: Referring Karyna Bessler: Gaetano Net Treating Makailyn Mccormick/Extender: Tito Dine in Treatment: 13 Vital Signs Time Taken: 08:02 Temperature (F): 98.2 Height (in): 76 Pulse (bpm): 64 Weight (lbs): 379.5 Respiratory Rate (breaths/min): 18 Body Mass Index (BMI): 46.2 Blood Pressure (mmHg): 196/78 Reference Range: 80 - 120 mg / dl Notes Made Dr. Dellia Nims aware of BP. Electronic Signature(s)  Signed: 04/03/2018 4:59:33 PM By: Alric Quan Entered By: Alric Quan on 03/29/2018 08:04:49

## 2018-04-19 ENCOUNTER — Encounter: Payer: Self-pay | Attending: Internal Medicine | Admitting: Internal Medicine

## 2018-04-19 DIAGNOSIS — I87302 Chronic venous hypertension (idiopathic) without complications of left lower extremity: Secondary | ICD-10-CM | POA: Insufficient documentation

## 2018-04-19 DIAGNOSIS — L97223 Non-pressure chronic ulcer of left calf with necrosis of muscle: Secondary | ICD-10-CM | POA: Insufficient documentation

## 2018-04-19 DIAGNOSIS — I129 Hypertensive chronic kidney disease with stage 1 through stage 4 chronic kidney disease, or unspecified chronic kidney disease: Secondary | ICD-10-CM | POA: Insufficient documentation

## 2018-04-19 DIAGNOSIS — L97211 Non-pressure chronic ulcer of right calf limited to breakdown of skin: Secondary | ICD-10-CM | POA: Insufficient documentation

## 2018-04-19 DIAGNOSIS — E1140 Type 2 diabetes mellitus with diabetic neuropathy, unspecified: Secondary | ICD-10-CM | POA: Insufficient documentation

## 2018-04-19 DIAGNOSIS — E11622 Type 2 diabetes mellitus with other skin ulcer: Secondary | ICD-10-CM | POA: Insufficient documentation

## 2018-04-19 DIAGNOSIS — E1122 Type 2 diabetes mellitus with diabetic chronic kidney disease: Secondary | ICD-10-CM | POA: Insufficient documentation

## 2018-04-19 DIAGNOSIS — I890 Lymphedema, not elsewhere classified: Secondary | ICD-10-CM | POA: Insufficient documentation

## 2018-04-19 DIAGNOSIS — N184 Chronic kidney disease, stage 4 (severe): Secondary | ICD-10-CM | POA: Insufficient documentation

## 2018-04-28 NOTE — Progress Notes (Signed)
[name] ([id number]) Visit Report for 04/19/2018 Arrival Information Details Patient Name: [name], [name] Date of Service: 04/19/2018 10:15 AM Medical Record Number: [id number] Patient Account Number: [id number] Date of Birth/Sex: 25-Nov-1970 (47 y.o. M) Treating RN: [name] Primary Care [name]: [name] Other Clinician: Referring [name]: [name] Treating [name]/Extender: [name] in Treatment: 16 Visit Information History Since Last Visit Added or deleted any medications: No Patient Arrived: Ambulatory Any new allergies or adverse reactions: No Arrival Time: 10:11 Had a fall or experienced change in No Accompanied By: self activities of daily living that may affect Transfer Assistance: None risk of falls: Patient Identification Verified: Yes Signs or symptoms of abuse/neglect since last visito No Secondary Verification Process Yes Hospitalized since last visit: No Completed: Implantable device outside of the clinic excluding No Patient Requires Transmission-Based No cellular tissue based products placed in the center Precautions: since last visit: Patient Has Alerts: Yes Has Dressing in Place as Prescribed: Yes Patient Alerts: DM II Pain Present Now: No noncompressible bilateral Electronic Signature(s) Signed: 04/19/2018 3:41:35 PM By: [name] Entered By: [name] on 04/19/2018 10:16:44 [name], [name] -------------------------------------------------------------------------------- Encounter Discharge Information Details Patient Name: [name], [name] Date of Service: 04/19/2018 10:15 AM Medical Record Number: [id number] Patient Account Number: [id number] Date of Birth/Sex: [birth year] (47 y.o. M) Treating RN: [name] Primary Care [name]: [name] Other Clinician: Referring Rad Gramling: [name] Treating [name]/Extender: [name] in Treatment: 16 Encounter Discharge Information  Items Discharge Condition: Stable Ambulatory Status: Ambulatory Discharge Destination: Home Transportation: Private Auto Schedule Follow-up Appointment: Yes Clinical Summary of Care: Electronic Signature(s) Signed: 04/19/2018 12:51:13 PM By: [name] Entered By: [name] on 04/19/2018 11:26:23 [name], [name] -------------------------------------------------------------------------------- Lower Extremity Assessment Details Patient Name: [name], [name] Date of Service: 04/19/2018 10:15 AM Medical Record Number: [id number] Patient Account Number: [id number] Date of Birth/Sex: 1970-12-18 (47 y.o. M) Treating RN: [name] Primary Care [name]: [name] Other Clinician: Referring [name]: [name] Treating [name]/Extender: [name] in Treatment: 16 Edema Assessment Assessed: [Left: No] [Right: No] Edema: [Left: Yes] [Right: Yes] Calf Left: Right: Point of Measurement: 44 cm From Medial Instep 68 cm 52 cm Ankle Left: Right: Point of Measurement: 14 cm From Medial Instep 53 cm 40.5 cm Vascular Assessment Claudication: Claudication Assessment [Left:None] [Right:None] Pulses: Dorsalis Pedis Palpable: [Left:Yes] [Right:Yes] Posterior Tibial Extremity colors, hair growth, and conditions: Extremity Color: [Left:Normal] [Right:Normal] Hair Growth on Extremity: [Left:No] [Right:No] Temperature of Extremity: [Left:Warm] [Right:Warm] Capillary Refill: [Left:< 3 seconds] [Right:< 3 seconds] Toe Nail Assessment Left: Right: Thick: Yes Yes Discolored: Yes Yes Deformed: No No Improper Length and Hygiene: No No Electronic Signature(s) Signed: 04/19/2018 3:41:35 PM By: [name] Entered By: [name] on 04/19/2018 10:31:09 [name], [name] -------------------------------------------------------------------------------- Multi Wound Chart Details Patient Name: [name], [name] Date of Service: 04/19/2018 10:15 AM Medical  Record Number: [id number] Patient Account Number: [id number] Date of Birth/Sex: 1970/12/10 (47 y.o. M) Treating RN: [name] Primary Care Alizee Maple: [name] Other Clinician: Referring [name]: [name] Treating [name]/Extender: [name] in Treatment: 16 Vital Signs Height(in): 76 Pulse(bpm): 59 Weight(lbs): 379.5 Blood Pressure(mmHg): 159/69 Body Mass Index(BMI): 46 Temperature(F): 98.3 Respiratory Rate 18 (breaths/min): Photos: Wound Location: Left Lower Leg - Lateral Left Lower Leg - Lateral, Right Lower Leg - Posterior Posterior Wounding Event: Surgical Injury Gradually Appeared Gradually Appeared Primary Etiology: Cellulitis Diabetic Wound/Ulcer of the Lymphedema Lower Extremity Secondary Etiology: N/A Lymphedema Diabetic Wound/Ulcer of  the Lower Extremity Comorbid History: Hypertension, Type II Hypertension, Type II Hypertension, Type II Diabetes, Neuropathy Diabetes, Neuropathy Diabetes, Neuropathy Date Acquired: 09/18/2017 09/30/2017 03/08/2018 Weeks of Treatment: 0 16 5 Wound Status: Open Open Open Measurements L x W x D 3x6.8x0.3 1.5x5.4x0.2 0.1x0.1x0.1 (cm) Area (cm) : 16.022 6.362 0.008 Volume (cm) : 4.807 1.272 0.001 % Reduction in Area: 0.00% 77.50% 99.90% % Reduction in Volume: 0.00% 88.80% 99.90% Classification: Partial Thickness Grade 2 Full Thickness Without Exposed Support Structures Exudate Amount: Large Large None Present Exudate Type: Serous Serous N/A Exudate Color: amber amber N/A Wound Margin: N/A Distinct, outline attached Distinct, outline attached Granulation Amount: Small (1-33%) Small (1-33%) None Present (0%) Granulation Quality: Red, Pink Red N/A Necrotic Amount: Small (1-33%) Large (67-100%) None Present (0%) Exposed Structures: Fascia: No Fat Layer (Subcutaneous Fascia: No Fat Layer (Subcutaneous Tissue) Exposed: Yes Fat Layer (Subcutaneous [name], [name] ([id number]) Tissue) Exposed: No Fascia:  No Tissue) Exposed: No Tendon: No Tendon: No Tendon: No Muscle: No Muscle: No Muscle: No Joint: No Joint: No Joint: No Bone: No Bone: No Bone: No Epithelialization: N/A None None Debridement: N/A N/A Debridement - Excisional Pre-procedure N/A N/A 11:08 Verification/Time Out Taken: Pain Control: N/A N/A Lidocaine 4% Topical Solution Tissue Debrided: N/A N/A Subcutaneous, Slough Level: N/A N/A Skin/Subcutaneous Tissue Debridement Area (sq cm): N/A N/A 13.75 Instrument: N/A N/A Curette Bleeding: N/A N/A Minimum Hemostasis Achieved: N/A N/A Pressure Procedural Pain: N/A N/A 0 Post Procedural Pain: N/A N/A 0 Debridement Treatment N/A N/A Procedure was tolerated well Response: Post Debridement N/A N/A 2.5x5.5x0.2 Measurements L x W x D (cm) Post Debridement Volume: N/A N/A 2.16 (cm) Periwound Skin Texture: Scarring: Yes No Abnormalities Noted No Abnormalities Noted Excoriation: No Induration: No Callus: No Crepitus: No Rash: No Periwound Skin Moisture: Maceration: No No Abnormalities Noted No Abnormalities Noted Dry/Scaly: No Periwound Skin Color: Atrophie Blanche: No Erythema: Yes No Abnormalities Noted Cyanosis: No Ecchymosis: No Erythema: No Hemosiderin Staining: No Mottled: No Pallor: No Rubor: No Erythema Location: N/A Circumferential N/A Temperature: N/A No Abnormality No Abnormality Tenderness on Palpation: No Yes Yes Wound Preparation: Ulcer Cleansing: Ulcer Cleansing: Ulcer Cleansing: Rinsed/Irrigated with Saline Rinsed/Irrigated with Saline Rinsed/Irrigated with Saline Topical Anesthetic Applied: Topical Anesthetic Applied: Topical Anesthetic Applied: Other: lidocaine 4% Other: lidocaine 4% Other: lidocaine 4% Procedures Performed: N/A N/A Debridement Treatment Notes Electronic Signature(s) Signed: 04/19/2018 5:35:06 PM By: Linton Ham MD Entered By: Linton Ham on 04/19/2018 11:20:13 Foerster, Ajamu A. ([id number]) [name], [name] [place]  ([id number]) -------------------------------------------------------------------------------- [place] Details Patient Name: [name], [name] Date of Service: 04/19/2018 10:15 AM Medical Record Number: [id number] Patient Account Number: [id number] Date of Birth/Sex: 1971-04-08 (47 y.o. M) Treating RN: [name] Primary Care Xochil Shanker: [name] Other Clinician: Referring Oriya Kettering: [name] Treating Zanaya Baize/Extender: [name] in Treatment: 16 Active Inactive ` Abuse / Safety / Falls / Self Care Management Nursing Diagnoses: Potential for falls Goals: Patient will remain injury free related to falls Date Initiated: 12/28/2017 Target Resolution Date: 04/08/2018 Goal Status: Active Interventions: Assess fall risk on admission and as needed Notes: ` Nutrition Nursing Diagnoses: Impaired glucose control: actual or potential Goals: Patient/caregiver verbalizes understanding of need to maintain therapeutic glucose control per primary care physician Date Initiated: 12/28/2017 Target Resolution Date: 04/08/2018 Goal Status: Active Interventions: Provide education on elevated blood sugars and impact on wound healing Notes: ` Orientation to the Wound Care Program Nursing Diagnoses: Knowledge deficit related to the wound healing center program Goals: Patient/caregiver will verbalize understanding of  the Wound Healing Center Program Date Initiated: 12/28/2017 Target Resolution Date: 04/08/2018 Goal Status: Active Interventions: Snavely, Khizar A. ([id number]) Provide education on orientation to the wound center Notes: ` Venous Leg Ulcer Nursing Diagnoses: Knowledge deficit related to disease process and management Goals: Patient will maintain optimal edema control Date Initiated: 03/27/2018 Target Resolution Date: 04/27/2018 Goal Status: Active Interventions: Assess peripheral edema status every visit. Compression as ordered Treatment  Activities: Therapeutic compression applied : 03/22/2018 Notes: ` Wound/Skin Impairment Nursing Diagnoses: Impaired tissue integrity Goals: Ulcer/skin breakdown will heal within 14 weeks Date Initiated: 12/28/2017 Target Resolution Date: 04/08/2018 Goal Status: Active Interventions: Assess patient/caregiver ability to obtain necessary supplies Assess patient/caregiver ability to perform ulcer/skin care regimen upon admission and as needed Assess ulceration(s) every visit Notes: Electronic Signature(s) Signed: 04/19/2018 5:18:29 PM By: [name] Entered By: [name] on 04/19/2018 11:05:44 [name], [name] ([id number]) -------------------------------------------------------------------------------- Pain Assessment Details Patient Name: [name], [name] Date of Service: 04/19/2018 10:15 AM Medical Record Number: [id number] Patient Account Number: [id number] Date of Birth/Sex: 07-Sep-1970 (47 y.o. M) Treating RN: [name] Primary Care Baileigh Modisette: [name] Other Clinician: Referring Leomia Blake: [name] Treating Andray Assefa/Extender: [name] in Treatment: 16 Active Problems Location of Pain Severity and Description of Pain Patient Has Paino No Site Locations Pain Management and Medication Current Pain Management: Goals for Pain Management pt denies any pain at this time. Electronic Signature(s) Signed: 04/19/2018 3:41:35 PM By: [name] Entered By: [name] on 04/19/2018 10:17:08 [name], [name] Flack ([id number]) -------------------------------------------------------------------------------- Patient/Caregiver Education Details Patient Name: Najera, Tom A. Date of Service: 04/19/2018 10:15 AM Medical Record Number: [id number] Patient Account Number: [id number] Date of Birth/Gender: Apr 18, 1971 (47 y.o. M) Treating RN: [name] Primary Care Physician: [name] Other Clinician: Referring Physician: Gaetano [name] Treating Physician/Extender:  [name] in Treatment: 16 Education Assessment Education Provided To: Patient Education Topics Provided Wound Debridement: Handouts: Wound Debridement Methods: Explain/Verbal Responses: State content correctly Wound/Skin Impairment: Handouts: Caring for Your Ulcer Methods: Explain/Verbal Responses: State content correctly Electronic Signature(s) Signed: 04/19/2018 12:51:13 PM By: [name] Entered By: [name] on 04/19/2018 11:26:39 Abella, Jhalen A. ([id number]) -------------------------------------------------------------------------------- Wound Assessment Details Patient Name: [name], [name] Date of Service: 04/19/2018 10:15 AM Medical Record Number: [id number] Patient Account Number: [id number] Date of Birth/Sex: [birth year] (47 y.o. M) Treating RN: [name] Primary Care Samyukta Cura: [name] Other Clinician: Referring Bakari Nikolai: [name] Treating Velisa Regnier/Extender: [name] in Treatment: 16 Wound Status Wound Number: 12 Primary Etiology: Cellulitis Wound Location: Left Lower Leg - Lateral Wound Status: Open Wounding Event: Surgical Injury Comorbid Hypertension, Type II Diabetes, History: Neuropathy Date Acquired: 09/18/2017 Weeks Of Treatment: 0 Clustered Wound: No Photos Photo Uploaded By: [name] on 04/19/2018 10:45:31 Wound Measurements Length: (cm) 3 % Reduct Width: (cm) 6.8 % Reduct Depth: (cm) 0.3 Area: (cm) 16.022 Volume: (cm) 4.807 ion in Area: 0% ion in Volume: 0% Wound Description Classification: Partial Thickness Foul Odo Exudate Amount: Large Slough/F Exudate Type: Serous Exudate Color: amber r After Cleansing: No ibrino Yes Wound Bed Granulation Amount: Small (1-33%) Exposed Structure Granulation Quality: Red, Pink Fascia Exposed: No Necrotic Amount: Small (1-33%) Fat Layer (Subcutaneous Tissue) Exposed: No Necrotic Quality: Adherent Slough Tendon Exposed: No Muscle Exposed: No Joint  Exposed: No Bone Exposed: No Periwound Skin Texture Texture Color [name], [name] ([id number]) No Abnormalities Noted: No No Abnormalities Noted: No Callus: No Atrophie Blanche: No Crepitus: No Cyanosis: No Excoriation: No Ecchymosis: No Induration: No Erythema: No Rash: No Hemosiderin  Staining: No Scarring: Yes Mottled: No Pallor: No Moisture Rubor: No No Abnormalities Noted: No Dry / Scaly: No Maceration: No Wound Preparation Ulcer Cleansing: Rinsed/Irrigated with Saline Topical Anesthetic Applied: Other: lidocaine 4%, Treatment Notes Wound #12 (Left, Lateral Lower Leg) 1. Cleansed with: Clean wound with Normal Saline 2. Anesthetic Topical Lidocaine 4% cream to wound bed prior to debridement 4. Dressing Applied: Other dressing (specify in notes) Notes silvercel and abd with kerlix wrap Electronic Signature(s) Signed: 04/19/2018 3:41:35 PM By: [name] Entered By: [name] on 04/19/2018 10:39:26 Crutchfield, Nemiah A. ([id number]) -------------------------------------------------------------------------------- Wound Assessment Details Patient Name: Steinhardt, Benjimin A. Date of Service: 04/19/2018 10:15 AM Medical Record Number: [id number] Patient Account Number: [id number] Date of Birth/Sex: 02-21-1971 (47 y.o. M) Treating RN: [name] Primary Care Bryar Rennie: [name] Other Clinician: Referring Andrienne Havener: [name] Treating Onita Pfluger/Extender: [name] in Treatment: 16 Wound Status Wound Number: 2 Primary Etiology: Diabetic Wound/Ulcer of the Lower Extremity Wound Location: Left Lower Leg - Lateral, Posterior Secondary Lymphedema Wounding Event: Gradually Appeared Etiology: Date Acquired: 09/30/2017 Wound Status: Open Weeks Of Treatment: 16 Comorbid History: Hypertension, Type II Diabetes, Clustered Wound: No Neuropathy Photos Photo Uploaded By: [name] on 04/19/2018 10:46:23 Wound Measurements Length: (cm) 1.5 Width: (cm) 5.4 Depth:  (cm) 0.2 Area: (cm) 6.362 Volume: (cm) 1.272 % Reduction in Area: 77.5% % Reduction in Volume: 88.8% Epithelialization: None Tunneling: No Undermining: No Wound Description Classification: Grade 2 Wound Margin: Distinct, outline attached Exudate Amount: Large Exudate Type: Serous Exudate Color: amber Foul Odor After Cleansing: No Slough/Fibrino Yes Wound Bed Granulation Amount: Small (1-33%) Exposed Structure Granulation Quality: Red Fascia Exposed: No Necrotic Amount: Large (67-100%) Fat Layer (Subcutaneous Tissue) Exposed: Yes Necrotic Quality: Adherent Slough Tendon Exposed: No Muscle Exposed: No Joint Exposed: No Bone Exposed: No Periwound Skin Texture Medeiros, Ignazio A. ([id number]) Texture Color No Abnormalities Noted: No No Abnormalities Noted: No Erythema: Yes Moisture Erythema Location: Circumferential No Abnormalities Noted: No Temperature / Pain Temperature: No Abnormality Tenderness on Palpation: Yes Wound Preparation Ulcer Cleansing: Rinsed/Irrigated with Saline Topical Anesthetic Applied: Other: lidocaine 4%, Treatment Notes Wound #2 (Left, Lateral, Posterior Lower Leg) 1. Cleansed with: Clean wound with Normal Saline 2. Anesthetic Topical Lidocaine 4% cream to wound bed prior to debridement 4. Dressing Applied: Other dressing (specify in notes) Notes silvercel and abd with kerlix wrap Electronic Signature(s) Signed: 04/19/2018 3:41:35 PM By: [name] Entered By: [name] on 04/19/2018 10:25:23 Rodocker, Violet A. ([id number]) -------------------------------------------------------------------------------- Wound Assessment Details Patient Name: Guercio, Kiernan A. Date of Service: 04/19/2018 10:15 AM Medical Record Number: [id number] Patient Account Number: [id number] Date of Birth/Sex: 04-22-1971 (47 y.o. M) Treating RN: [name] Primary Care Angelle Isais: [name] Other Clinician: Referring Binyomin Brann: [name] Treating Brighton Pilley/Extender:  [name] in Treatment: 16 Wound Status Wound Number: 9 Primary Etiology: Lymphedema Wound Location: Right Lower Leg - Posterior Secondary Diabetic Wound/Ulcer of the Lower Etiology: Extremity Wounding Event: Gradually Appeared Wound Status: Open Date Acquired: 03/08/2018 Comorbid History: Hypertension, Type II Diabetes, Weeks Of Treatment: 5 Neuropathy Clustered Wound: No Photos Photo Uploaded By: [name] on 04/19/2018 10:46:24 Wound Measurements Length: (cm) 0.1 Width: (cm) 0.1 Depth: (cm) 0.1 Area: (cm) 0.008 Volume: (cm) 0.001 % Reduction in Area: 99.9% % Reduction in Volume: 99.9% Epithelialization: None Tunneling: No Undermining: No Wound Description Full Thickness Without Exposed Support Foul O Classification: Structures Slough Wound Margin: Distinct, outline attached Exudate None Present Amount: dor After Cleansing: No /Fibrino No Wound Bed Granulation Amount: None Present (0%)  Exposed Structure Necrotic Amount: None Present (0%) Fascia Exposed: No Fat Layer (Subcutaneous Tissue) Exposed: No Tendon Exposed: No Muscle Exposed: No Joint Exposed: No Bone Exposed: No Periwound Skin Texture Texture Color [place], Willis A. ([id number]) No Abnormalities Noted: Yes No Abnormalities Noted: Yes Moisture Temperature / Pain No Abnormalities Noted: No Temperature: No Abnormality Tenderness on Palpation: Yes Wound Preparation Ulcer Cleansing: Rinsed/Irrigated with Saline Topical Anesthetic Applied: Other: lidocaine 4%, Treatment Notes Wound #9 (Right, Posterior Lower Leg) 1. Cleansed with: Clean wound with Normal Saline 2. Anesthetic Topical Lidocaine 4% cream to wound bed prior to debridement 4. Dressing Applied: Other dressing (specify in notes) Notes silvercel and abd with kerlix wrap Electronic Signature(s) Signed: 04/19/2018 3:41:35 PM By: [name] Entered By: [name] on 04/19/2018 10:27:22 Nawabi, Emrys A.  ([id number]) -------------------------------------------------------------------------------- Vitals Details Patient Name: Fredricksen, Aydrian A. Date of Service: 04/19/2018 10:15 AM Medical Record Number: [id number] Patient Account Number: [id number] Date of Birth/Sex: 28-Aug-1971 (47 y.o. M) Treating RN: [name] Primary Care Venesha Petraitis: [name] Other Clinician: Referring Kenyatta Keidel: [name] Treating Yigit Norkus/Extender: [name] in Treatment: 16 Vital Signs Time Taken: 10:10 Temperature (F): 98.3 Height (in): 76 Pulse (bpm): 59 Weight (lbs): 379.5 Respiratory Rate (breaths/min): 18 Body Mass Index (BMI): 46.2 Blood Pressure (mmHg): 159/69 Reference Range: 80 - 120 mg / dl Electronic Signature(s) Signed: 04/19/2018 3:41:35 PM By: [name] Entered BySecundino [name] on 04/19/2018 10:19:51

## 2018-04-29 NOTE — Progress Notes (Signed)
[name] ([id number]) Visit Report for 04/19/2018 Debridement Details Patient Name: [name], [name] Date of Service: 04/19/2018 10:15 AM Medical Record Number: [id number] Patient Account Number: [id number] Date of Birth/Sex: [birth year] (47 y.o. M) Treating RN: Cornell Barman Primary Care Provider: Gaetano Net Other Clinician: Referring Provider: Gaetano Net Treating Provider/Extender: Tito Dine in Treatment: 16 Debridement Performed for Wound #9 Right,Posterior Lower Leg Assessment: Performed By: Physician Ricard Dillon, MD Debridement Type: Debridement Severity of Tissue Pre Fat layer exposed Debridement: Pre-procedure Verification/Time Yes - 11:08 Out Taken: Start Time: 11:08 Pain Control: Lidocaine 4% Topical Solution Total Area Debrided (L x W): 2.5 (cm) x 5.5 (cm) = 13.75 (cm) Tissue and other material Viable, Non-Viable, Slough, Subcutaneous, Slough debrided: Level: Skin/Subcutaneous Tissue Debridement Description: Excisional Instrument: Curette Bleeding: Minimum Hemostasis Achieved: Pressure End Time: 11:10 Procedural Pain: 0 Post Procedural Pain: 0 Response to Treatment: Procedure was tolerated well Level of Consciousness: Awake and Alert Post Debridement Measurements of Total Wound Length: (cm) 2.5 Width: (cm) 5.5 Depth: (cm) 0.2 Volume: (cm) 2.16 Character of Wound/Ulcer Post Debridement: Improved Severity of Tissue Post Debridement: Fat layer exposed Post Procedure Diagnosis Same as Pre-procedure Electronic Signature(s) Signed: 04/19/2018 5:35:06 PM By: Linton Ham MD Signed: 04/20/2018 4:35:08 PM By: Gretta Cool, BSN, RN, CWS, Kim RN, BSN Entered By: Linton Ham on 04/19/2018 11:20:26 [name], [name] -------------------------------------------------------------------------------- HPI Details Patient Name: [name], [name] Date of Service: 04/19/2018 10:15 AM Medical Record Number: [id number] Patient Account Number:  [id number] Date of Birth/Sex: [birth year] (47 y.o. M) Treating RN: Cornell Barman Primary Care Provider: Gaetano Net Other Clinician: Referring Provider: Gaetano Net Treating Provider/Extender: Tito Dine in Treatment: 16 History of Present Illness HPI Description: 12/28/17; this is a now 47 year old man who is a type II diabetic. He was hospitalized from 10/01/17 through 10/19/17. He had an MSSA soft tissue and skin infection. 2 open areas on the left leg were identified he has a smaller area on the left medial calf superiorly just below the knee and a wound just above the left ankle on the posterior medial aspect. I think both of these were surgical IandD sites when he was in the hospital. He was discharged with a wound VAC at that point however this is since been taken off. He follows with Dr. Ola Spurr for the [facility] and he is still on chronic Keflex at 500 twice a day. At that time he was hospitalized his hemoglobin A1c was 15.1 however if I'm reading his endocrinologist notes correctly that is improved. He has been following with Dr. [name] at [facility] vascular and he has been applying calcium alginate and Unna boots. He has home health changing the dressing. They have also been attempting to get him external compression pumps although the patient is unaware whether they've been approved by insurance at this point. as mentioned he has a smaller clean wound on the right lateral calf just below the knee and he has a much larger area just above the left ankle medially and posteriorly. Our intake nurse reported greenish purulent looking drainage.the patient did have surgical material sent to pathology in February. This showed chronic abscess The patient also has lymphedema stage III in the left greater than right lower extremities. He has a history of blisters with wounds but these of all were always healed. The patient thinks that the lymphedema may have been present since he was about  47 years old i.e. about 30 years. He does not have graded pressure stockings  and has not worn stockings. He does not have a distant history of DVT PE or phlebitis. He has not been systemically unwell fever no chills. He states that his Lasix is recently been reduced. He tells me his kidney function is at "30%" and he has been followed by Dr. [name] of nephrology. At one point he was on Lasix 80 twice a day however that's been cut back and he is now on Lasix at 20 twice a day. The patient has a history of PAD listed in his records although he comes from Dr. [name] I don't think is felt to have significant PAD. ABIs in our clinic were noncompressible bilaterally. 01/04/18; patient has a large wound on the left lateral lower calf and a small wound on the left medial upper calf. He has been to see his nephrologist who changed him to Demadex 40 mg a day. I'm hopeful this will help with his systemic fluid overload. He also has stage III lymphedema. Really no change in the 2 wounds since last week 01/11/18; the patient is down 13 pounds. He put his stage III lymphedema left leg in 4K compression last week and there is less edema fluid however we still haven't been able to communicate with home health but [facility] is [facility] but the dressings have not been changed. The patient noted an odor last week. He is also had compression pumps ordered by [facility] this as a not completed the paperwork stage. 01/18/18; patient continues to lose weight. Stage III lymphedema in the left greater than right leg under for alert compression. The major wound is on the left lateral ankle area. He apparently has bilateral compression pumps being brought to his house, these were ordered by [facility] Notable for the fact today he had some blisters on the right anterior leg together with some skin nodules. This is no doubt secondary to severe lymphedema. 01/25/18; the patient has obtained his  compression pumps and is using them per vein and vascular instructions 3 times a day for an hour. He also saw [name] of vein and vascular. He was felt to have venous insufficiency but did not suggest any intervention also improved edema. It was suggested that he have compression stockings 20-30 mm on a daily basis in addition to compression pumps. The patient arrives in clinic today with a layer of unna under for layer compression. he seems to have some trouble with the degree of compression. He has open areas on the left lateral ankle area which is his major wound left upper medial calf and a superficial open area on the right anterior shin area which was blistered last week. He has skin changes on the right anterior Winkel, Breckin A. ([id number]) calf which I think are no doubt secondary to lymphedema skin nodules etc. 02/01/18; the patient comes in telling us his nephrologist have to his torsemide. Unfortunately today he is put on 7 pounds by our scales. He has blisters all over the anterior and medial part of his right calf and a new open wound. He also has soupy green drainage coming out of the left lateral calf /ankle wound. 02/08/18; culture I did last week of the left lateral ankle wound grew both Pseudomonas and Morganella. He is on Keflex from Dr. Ola Spurr in the hospital. I will need to review these notes.in any case Keflex is not going to cover these 2 organisms. I'm probably going to added ciprofloxacin today for 1 week. A lot of  drainage that looks purulent last week. He is not complaining of pain however he has managed to put on 10 pounds in 2 weeks by our scales in this clinic. He is going to see his nephrologist tomorrow 02/15/18; he completed the ciprofloxacin I gave him last week. Notable that he is up to 379 pounds today which is up 16 pounds from 2 weeks ago. He is complaining of orthopnea but doesn't have any chest pain. 02/22/18; he continues to have weight gain. R intake nurse  reports again purulent green drainage coming out of the lateral wound on the lateral left calf. He has small open area on the right anterior leg. His torsemide was increased to 2 tablets a day I believe this is 40 mg last week in response to the call admitted to Dr. Keturah Barre office. He follows up with Dr. [name] and Dr. Delana Meyer tomorrow 03/01/18 his weight essentially stable today at 383 pounds. Drainage out of the left lateral wound on the lower left calf/ankle is a lot less. Culture last time grew Pseudomonas. I put him on cefdinir. He has been to Dr. Keturah Barre office no adjustments in his diuretics. Dr. Nicoletta Dress prescribed a wraparound stocking for the right leg.there is no open area on the right leg. He has a superficial area on the left medial calf, left posterior calf and in the large area on the left lateral however this looks better 03/15/18; weight is not up to 393 pounds. He saw his nephrologist yesterday Dr. [name] will increase the Demadex I'm hopeful this will help with the edema control. I'm using silver alginate to all his wounds. In particular the left lateral ankle looks better. oUnfortunately he has new open areas on the right lateral calf that will include use of his compression stocking at least in the short to medium term. He has new wounds o3 on the right lateral calf. One of these has some size however all numerous superficial 03/22/18; his weight is stabilized a bit. Just adjustment of his diuretics by his nephrologist. There is no doubt he has some degree of systemic fluid overload on top of severe left greater than right lymphedema. 2 weeks ago tried to transition him to stockings on the right leg however he developed re-breakdown of skin on the right leg and we had to put him back in compression last week. He also uses external compression pumps and claims to be compliant Continued concern about his depression today 03/29/18; several ongoing issues with this patient; oHe no longer  has home health coverage apparently secondary to a lapse in insurance. He is apparently transitioning from short for long-term disability. [facility] was changing his compression wraps on Monday and Friday. oHe has gained 10 pounds since last week oSees Dr. [name] tomorrow oSaw his primary doctor last week about the depression. It sounds as though he declined pharmacologic management. He seems somewhat better today. We were concerned last week when he came. Somewhat better today oHe is using his compression pumps once a day at home, I have asked for twice a day if possible especially on the left leg oFollows up with his nephrologist in mid-August. He is managing his diuretic for I think stage IV chronic renal failure oParadoxically his wounds actually look better 04/19/18; the patient has not been seen since I last saw him 3 weeks ago. He saw Dr. Delana Meyer of vascular surgery on 03/30/18 I believe he put him in a 20/30 stocking bilaterally with a wraparound extremitease stocking. He has  not been putting anything specifically on the wound. More problematic than that he has not been wearing the stockings he is at home. He has been using his external compression pumps once per day according to him on a rare occasion twice On a psychosocial level the patient is now on long-term disability and is applying for COBRA therefore he is between insurances. He has not been able to follow up with Dr. [name] who is his nephrologist as a result. As noted his weight is up to 407 pounds today. He promises me he'll follow-up with Dr. [name] Electronic Signature(s) Signed: 04/19/2018 5:35:06 PM By: Linton Ham MD Entered By: Linton Ham on 04/19/2018 11:26:11 [name], Corry. ([id number]) -------------------------------------------------------------------------------- Physical Exam Details Patient Name: [name], [name] Date of Service: 04/19/2018 10:15 AM Medical Record Number: [id number] Patient Account  Number: [id number] Date of Birth/Sex: 1970-09-02 (47 y.o. M) Treating RN: Cornell Barman Primary Care Provider: Gaetano Net Other Clinician: Referring Provider: Gaetano Net Treating Provider/Extender: Tito Dine in Treatment: 63 Constitutional Patient is hypertensive.. Pulse regular and within target range for patient.[name] Respirations regular, non-labored and within target range.. Temperature is normal and within the target range for the patient.[name] appears in no distress. Notes when exam; large wound on the left lateral calf just above the ankle is actually smaller. Covered any tear and necrotic debris, debrided with a #5 curet. Hemostasis with a pressure dressing. oHe has an area on the middle medial calf which is superficial healthy granulation oShe has nothing open on the right leg Electronic Signature(s) Signed: 04/19/2018 5:35:06 PM By: Linton Ham MD Entered By: Linton Ham on 04/19/2018 11:27:17 Tarlton, Denim A. ([id number]) -------------------------------------------------------------------------------- Physician Orders Details Patient Name: [name], [name] Date of Service: 04/19/2018 10:15 AM Medical Record Number: [id number] Patient Account Number: [id number] Date of Birth/Sex: 1971-06-08 (47 y.o. M) Treating RN: Montey Hora Primary Care Provider: Gaetano Net Other Clinician: Referring Provider: Gaetano Net Treating Provider/Extender: Tito Dine in Treatment: 41 Verbal / Phone Orders: No Diagnosis Coding Wound Cleansing Wound #12 Left,Lateral Lower Leg o Cleanse wound with mild soap and water Wound #2 Left,Lateral,Posterior Lower Leg o Cleanse wound with mild soap and water Wound #9 Right,Posterior Lower Leg o Cleanse wound with mild soap and water Anesthetic (add to Medication List) Wound #12 Left,Lateral Lower Leg o Topical Lidocaine 4% cream applied to wound bed prior to debridement (In Clinic Only). Wound #2  Left,Lateral,Posterior Lower Leg o Topical Lidocaine 4% cream applied to wound bed prior to debridement (In Clinic Only). Wound #9 Right,Posterior Lower Leg o Topical Lidocaine 4% cream applied to wound bed prior to debridement (In Clinic Only). Skin Barriers/Peri-Wound Care Wound #12 Left,Lateral Lower Leg o Moisturizing lotion - to legs. Wound #2 Left,Lateral,Posterior Lower Leg o Moisturizing lotion - to legs. Wound #9 Right,Posterior Lower Leg o Moisturizing lotion - to legs. Primary Wound Dressing Wound #12 Left,Lateral Lower Leg o Silver Alginate Wound #2 Left,Lateral,Posterior Lower Leg o Silver Alginate Wound #9 Right,Posterior Lower Leg o Silver Alginate Secondary Dressing Wound #12 Left,Lateral Lower Leg o ABD and Kerlix/Conform [name], [name] ([id number]) Wound #2 Left,Lateral,Posterior Lower Leg o ABD and Kerlix/Conform Wound #9 Right,Posterior Lower Leg o ABD and Kerlix/Conform Dressing Change Frequency Wound #12 Left,Lateral Lower Leg o Change Dressing Monday, Wednesday, Friday Wound #2 Left,Lateral,Posterior Lower Leg o Change Dressing Monday, Wednesday, Friday Wound #9 Right,Posterior Lower Leg o Change Dressing Monday, Wednesday, Friday Follow-up Appointments Wound #12 Left,Lateral Lower Leg o Return Appointment in 2 weeks. Wound #2  Left,Lateral,Posterior Lower Leg o Return Appointment in 2 weeks. Wound #9 Right,Posterior Lower Leg o Return Appointment in 2 weeks. Edema Control Wound #12 Left,Lateral Lower Leg o Patient to wear own Velcro compression garment. - both legs o Elevate legs to the level of the heart and pump ankles as often as possible o Compression Pump: Use compression pump on left lower extremity for 30 minutes, twice daily. - 1 hour twice daily o Compression Pump: Use compression pump on right lower extremity for 30 minutes, twice daily. - 1 hour twice daily Wound #2 Left,Lateral,Posterior Lower  Leg o Patient to wear own Velcro compression garment. - both legs o Elevate legs to the level of the heart and pump ankles as often as possible o Compression Pump: Use compression pump on left lower extremity for 30 minutes, twice daily. - 1 hour twice daily o Compression Pump: Use compression pump on right lower extremity for 30 minutes, twice daily. - 1 hour twice daily Wound #9 Right,Posterior Lower Leg o Patient to wear own Velcro compression garment. - both legs o Elevate legs to the level of the heart and pump ankles as often as possible o Compression Pump: Use compression pump on left lower extremity for 30 minutes, twice daily. - 1 hour twice daily o Compression Pump: Use compression pump on right lower extremity for 30 minutes, twice daily. - 1 hour twice daily Electronic Signature(s) Signed: 04/19/2018 5:18:29 PM By: Lutricia Feil, ERRICK SALTS ([id number]) Signed: 04/19/2018 5:35:06 PM By: Linton Ham MD Entered By: Montey Hora on 04/19/2018 11:16:46 [name], [name] ([id number]) -------------------------------------------------------------------------------- Problem List Details Patient Name: Sainvil, Hoang A. Date of Service: 04/19/2018 10:15 AM Medical Record Number: [id number] Patient Account Number: [id number] Date of Birth/Sex: [birth year] (47 y.o. 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 He is not been able to see his he is back in his own change nephrologist because he is between insurances and compression stocking applying for COBRA with a Medical wraparound/extremities Decision stocking externally. He Making : is using his compression pumps once a day I87.332 Chronic venous hypertension (idiopathic) with ulcer and 12/28/2017 No Yes inflammation of left lower extremity E11.622 Type 2 diabetes mellitus with other skin ulcer 12/28/2017 No Yes L97.211 Non-pressure chronic ulcer of right calf limited to 02/22/2018 Yes Yes breakdown of skin Status Complications Interventions Improving leg is better now back in compression wraps. One open continue's overall Medical wound on the posterior calf [place] under Decision compression wraps Making : and compression pumps [name], [name] ([id number]) Inactive Problems Resolved Problems Electronic Signature(s) Signed: 04/19/2018 5:35:06 PM By: Linton Ham MD Entered By: Linton Ham on 04/19/2018 11:19:41 Lineberry, Christorpher A. ([id number]) -------------------------------------------------------------------------------- Progress Note Details Patient Name: Cogdell, Joren A. Date of Service: 04/19/2018 10:15 AM Medical Record Number: [id number] Patient Account Number: [id number] Date of Birth/Sex: 1971-04-22 (47 y.o. M) Treating RN: Cornell Barman Primary Care Provider: Gaetano Net Other Clinician: Referring Provider: Gaetano Net  Treating Provider/Extender: Ricard Dillon Weeks in Treatment: 16 Subjective History of Present Illness (HPI) 12/28/17; this is a now 47 year old man who is a type II diabetic. He was hospitalized from 10/01/17 through 10/19/17. He had an MSSA soft tissue and skin infection. 2 open areas on the left leg were identified he has a smaller area on the left medial  calf superiorly just below the knee and a wound just above the left ankle on the posterior medial aspect. I think both of these were surgical IandD sites when he was in the hospital. He was discharged with a wound VAC at that point however this is since been taken off. He follows with Dr. Ola Spurr for the [facility] and he is still on chronic Keflex at 500 twice a day. At that time he was hospitalized his hemoglobin A1c was 15.1 however if I'm reading his endocrinologist notes correctly that is improved. He has been following with Dr. [name] at [facility] and he has been applying calcium alginate and Unna boots. He has home health changing the dressing. They have also been attempting to get him external compression pumps although the patient is unaware whether they've been approved by insurance at this point. as mentioned he has a smaller clean wound on the right lateral calf just below the knee and he has a much larger area just above the left ankle medially and posteriorly. Our intake nurse reported greenish purulent looking drainage.the patient did have surgical material sent to pathology in February. This showed chronic abscess The patient also has lymphedema stage III in the left greater than right lower extremities. He has a history of blisters with wounds but these of all were always healed. The patient thinks that the lymphedema may have been present since he was about 47 years old i.e. about 30 years. He does not have graded pressure stockings and has not worn stockings. He does not have a distant history of DVT PE or phlebitis. He has not been systemically unwell fever no chills. He states that his Lasix is recently been reduced. He tells me his kidney function is at "30%" and he has been followed by Dr. [name] of nephrology. At one point he was on Lasix 80 twice a day however that's been cut back and he is now on Lasix at 20 twice a day. The patient has a history of PAD listed in his  records although he comes from Dr. [name] I don't think is felt to have significant PAD. ABIs in our clinic were noncompressible bilaterally. 01/04/18; patient has a large wound on the left lateral lower calf and a small wound on the left medial upper calf. He has been to see his nephrologist who changed him to Demadex 40 mg a day. I'm hopeful this will help with his systemic fluid overload. He also has stage III lymphedema. Really no change in the 2 wounds since last week 01/11/18; the patient is down 13 pounds. He put his stage III lymphedema left leg in 4K compression last week and there is less edema fluid however we still haven't been able to communicate with home health but apparently it is [facility] but the dressings have not been changed. The patient noted an odor last week. He is also had compression pumps ordered by [facility] this as a not completed the paperwork stage. 01/18/18; patient continues to lose weight. Stage III lymphedema in the left greater than right leg under for alert compression. The major wound is  on the left lateral ankle area. He apparently has bilateral compression pumps being brought to his house, these were ordered by [facility] Notable for the fact today he had some blisters on the right anterior leg together with some skin nodules. This is no doubt secondary to severe lymphedema. 01/25/18; the patient has obtained his compression pumps and is using them per vein and vascular instructions 3 times a day for an hour. He also saw [name] of vein and vascular. He was felt to have venous insufficiency but did not suggest any intervention also improved edema. It was suggested that he have compression stockings 20-30 mm on a daily basis in addition to compression pumps. The patient arrives in clinic today with a layer of unna under for layer compression. he seems to have some trouble with the degree of compression. He has open areas on the  left lateral ankle area which is his major wound left upper medial calf and a [name], [name] ([id number]) superficial open area on the right anterior shin area which was blistered last week. He has skin changes on the right anterior calf which I think are no doubt secondary to lymphedema skin nodules etc. 02/01/18; the patient comes in telling us his nephrologist have to his torsemide. Unfortunately today he is put on 7 pounds by our scales. He has blisters all over the anterior and medial part of his right calf and a new open wound. He also has soupy green drainage coming out of the left lateral calf /ankle wound. 02/08/18; culture I did last week of the left lateral ankle wound grew both Pseudomonas and Morganella. He is on Keflex from Dr. Ola Spurr in the hospital. I will need to review these notes.in any case Keflex is not going to cover these 2 organisms. I'm probably going to added ciprofloxacin today for 1 week. A lot of drainage that looks purulent last week. He is not complaining of pain however he has managed to put on 10 pounds in 2 weeks by our scales in this clinic. He is going to see his nephrologist tomorrow 02/15/18; he completed the ciprofloxacin I gave him last week. Notable that he is up to 379 pounds today which is up 16 pounds from 2 weeks ago. He is complaining of orthopnea but doesn't have any chest pain. 02/22/18; he continues to have weight gain. R intake nurse reports again purulent green drainage coming out of the lateral wound on the lateral left calf. He has small open area on the right anterior leg. His torsemide was increased to 2 tablets a day I believe this is 40 mg last week in response to the call admitted to Dr. Keturah Barre office. He follows up with Dr. [name] and Dr. Delana Meyer tomorrow 03/01/18 his weight essentially stable today at 383 pounds. Drainage out of the left lateral wound on the lower left calf/ankle is a lot less. Culture last time grew Pseudomonas. I put him  on cefdinir. He has been to Dr. Keturah Barre office no adjustments in his diuretics. Dr. Nicoletta Dress prescribed a wraparound stocking for the right leg.there is no open area on the right leg. He has a superficial area on the left medial calf, left posterior calf and in the large area on the left lateral however this looks better 03/15/18; weight is not up to 393 pounds. He saw his nephrologist yesterday Dr. [name] will increase the Demadex I'm hopeful this will help with the edema control. I'm using silver alginate to all his wounds.  In particular the left lateral ankle looks better. Unfortunately he has new open areas on the right lateral calf that will include use of his compression stocking at least in the short to medium term. He has new wounds o3 on the right lateral calf. One of these has some size however all numerous superficial 03/22/18; his weight is stabilized a bit. Just adjustment of his diuretics by his nephrologist. There is no doubt he has some degree of systemic fluid overload on top of severe left greater than right lymphedema. 2 weeks ago tried to transition him to stockings on the right leg however he developed re-breakdown of skin on the right leg and we had to put him back in compression last week. He also uses external compression pumps and claims to be compliant Continued concern about his depression today 03/29/18; several ongoing issues with this patient; He no longer has home health coverage apparently secondary to a lapse in insurance. He is apparently transitioning from short for long-term disability. [facility] was changing his compression wraps on Monday and Friday. He has gained 10 pounds since last week Sees Dr. [name] tomorrow Saw his primary doctor last week about the depression. It sounds as though he declined pharmacologic management. He seems somewhat better today. We were concerned last week when he came. Somewhat better today He is using his compression pumps  once a day at home, I have asked for twice a day if possible especially on the left leg Follows up with his nephrologist in mid-August. He is managing his diuretic for I think stage IV chronic renal failure Paradoxically his wounds actually look better 04/19/18; the patient has not been seen since I last saw him 3 weeks ago. He saw Dr. Delana Meyer of vascular surgery on 03/30/18 I believe he put him in a 20/30 stocking bilaterally with a wraparound extremitease stocking. He has not been putting anything specifically on the wound. More problematic than that he has not been wearing the stockings he is at home. He has been using his external compression pumps once per day according to him on a rare occasion twice On a psychosocial level the patient is now on long-term disability and is applying for COBRA therefore he is between insurances. He has not been able to follow up with Dr. [name] who is his nephrologist as a result. As noted his weight is up to 407 pounds today. He promises me he'll follow-up with Dr. Marrianne [name], [place]. ([id number]) Objective Constitutional Patient is hypertensive.. Pulse regular and within target range for patient.[name] Respirations regular, non-labored and within target range.. Temperature is normal and within the target range for the patient.[name] appears in no distress. Vitals Time Taken: 10:10 AM, Height: 76 in, Weight: 379.5 lbs, BMI: 46.2, Temperature: 98.3 F, Pulse: 59 bpm, Respiratory Rate: 18 breaths/min, Blood Pressure: 159/69 mmHg. General Notes: when exam; large wound on the left lateral calf just above the ankle is actually smaller. Covered any tear and necrotic debris, debrided with a #5 curet. Hemostasis with a pressure dressing. He has an area on the middle medial calf which is superficial healthy granulation She has nothing open on the right leg Integumentary (Hair, Skin) Wound #12 status is Open. Original cause of wound was Surgical Injury. The wound is located on  the Left,Lateral Lower Leg. The wound measures 3cm length x 6.8cm width x 0.3cm depth; 16.022cm^2 area and 4.807cm^3 volume. There is a large amount of serous drainage noted. There is small (1-33%) red, pink granulation  within the wound bed. There is a small (1-33%) amount of necrotic tissue within the wound bed including Adherent Slough. The periwound skin appearance exhibited: Scarring. The periwound skin appearance did not exhibit: Callus, Crepitus, Excoriation, Induration, Rash, Dry/Scaly, Maceration, Atrophie Blanche, Cyanosis, Ecchymosis, Hemosiderin Staining, Mottled, Pallor, Rubor, Erythema. Wound #2 status is Open. Original cause of wound was Gradually Appeared. The wound is located on the Left,Lateral,Posterior Lower Leg. The wound measures 1.5cm length x 5.4cm width x 0.2cm depth; 6.362cm^2 area and 1.272cm^3 volume. There is Fat Layer (Subcutaneous Tissue) Exposed exposed. There is no tunneling or undermining noted. There is a large amount of serous drainage noted. The wound margin is distinct with the outline attached to the wound base. There is small (1-33%) red granulation within the wound bed. There is a large (67-100%) amount of necrotic tissue within the wound bed including Adherent Slough. The periwound skin appearance exhibited: Erythema. The surrounding wound skin color is noted with erythema which is circumferential. Periwound temperature was noted as No Abnormality. The periwound has tenderness on palpation. Wound #9 status is Open. Original cause of wound was Gradually Appeared. The wound is located on the Right,Posterior Lower Leg. The wound measures 0.1cm length x 0.1cm width x 0.1cm depth; 0.008cm^2 area and 0.001cm^3 volume. There is no tunneling or undermining noted. There is a none present amount of drainage noted. The wound margin is distinct with the outline attached to the wound base. There is no granulation within the wound bed. There is no necrotic tissue within  the wound bed. The periwound skin appearance had no abnormalities noted for texture. The periwound skin appearance had no abnormalities noted for color. Periwound temperature was noted as No Abnormality. The periwound has tenderness on palpation. Assessment Active Problems ICD-10 Non-pressure chronic ulcer of left calf with necrosis of muscle Lymphedema, not elsewhere classified Chronic venous hypertension (idiopathic) with ulcer and inflammation of left lower extremity Type 2 diabetes mellitus with other skin ulcer Non-pressure chronic ulcer of right calf limited to breakdown of skin [name], [name] ([id number]) Procedures Wound #9 Pre-procedure diagnosis of Wound #9 is a Lymphedema located on the Right,Posterior Lower Leg .Severity of Tissue Pre Debridement is: Fat layer exposed. There was a Excisional Skin/Subcutaneous Tissue Debridement with a total area of 13.75 sq cm performed by Ricard Dillon, MD. With the following instrument(s): Curette to remove Viable and Non-Viable tissue/material. Material removed includes Subcutaneous Tissue and Slough and after achieving pain control using Lidocaine 4% Topical Solution. No specimens were taken. A time out was conducted at 11:08, prior to the start of the procedure. A Minimum amount of bleeding was controlled with Pressure. The procedure was tolerated well with a pain level of 0 throughout and a pain level of 0 following the procedure. Patient s Level of Consciousness post procedure was recorded as Awake and Alert. Post Debridement Measurements: 2.5cm length x 5.5cm width x 0.2cm depth; 2.16cm^3 volume. Character of Wound/Ulcer Post Debridement is improved. Severity of Tissue Post Debridement is: Fat layer exposed. Post procedure Diagnosis Wound #9: Same as Pre-Procedure Plan Wound Cleansing: Wound #12 Left,Lateral Lower Leg: Cleanse wound with mild soap and water Wound #2 Left,Lateral,Posterior Lower Leg: Cleanse wound with mild soap  and water Wound #9 Right,Posterior Lower Leg: Cleanse wound with mild soap and water Anesthetic (add to Medication List): Wound #12 Left,Lateral Lower Leg: Topical Lidocaine 4% cream applied to wound bed prior to debridement (In Clinic Only). Wound #2 Left,Lateral,Posterior Lower Leg: Topical Lidocaine 4% cream applied to wound  bed prior to debridement (In Clinic Only). Wound #9 Right,Posterior Lower Leg: Topical Lidocaine 4% cream applied to wound bed prior to debridement (In Clinic Only). Skin Barriers/Peri-Wound Care: Wound #12 Left,Lateral Lower Leg: Moisturizing lotion - to legs. Wound #2 Left,Lateral,Posterior Lower Leg: Moisturizing lotion - to legs. Wound #9 Right,Posterior Lower Leg: Moisturizing lotion - to legs. Primary Wound Dressing: Wound #12 Left,Lateral Lower Leg: Silver Alginate Wound #2 Left,Lateral,Posterior Lower Leg: Silver Alginate Wound #9 Right,Posterior Lower Leg: Silver Alginate Secondary Dressing: Wound #12 Left,Lateral Lower Leg: ABD and Kerlix/Conform Wound #2 Left,Lateral,Posterior Lower Leg: ABD and Kerlix/Conform Wound #9 Right,Posterior Lower Leg: ABD and Kerlix/Conform Dressing Change Frequency: [name], [name] ([id number]) Wound #12 Left,Lateral Lower Leg: Change Dressing Monday, Wednesday, Friday Wound #2 Left,Lateral,Posterior Lower Leg: Change Dressing Monday, Wednesday, Friday Wound #9 Right,Posterior Lower Leg: Change Dressing Monday, Wednesday, Friday Follow-up Appointments: Wound #12 Left,Lateral Lower Leg: Return Appointment in 2 weeks. Wound #2 Left,Lateral,Posterior Lower Leg: Return Appointment in 2 weeks. Wound #9 Right,Posterior Lower Leg: Return Appointment in 2 weeks. Edema Control: Wound #12 Left,Lateral Lower Leg: Patient to wear own Velcro compression garment. - both legs Elevate legs to the level of the heart and pump ankles as often as possible Compression Pump: Use compression pump on left lower extremity for 30  minutes, twice daily. - 1 hour twice daily Compression Pump: Use compression pump on right lower extremity for 30 minutes, twice daily. - 1 hour twice daily Wound #2 Left,Lateral,Posterior Lower Leg: Patient to wear own Velcro compression garment. - both legs Elevate legs to the level of the heart and pump ankles as often as possible Compression Pump: Use compression pump on left lower extremity for 30 minutes, twice daily. - 1 hour twice daily Compression Pump: Use compression pump on right lower extremity for 30 minutes, twice daily. - 1 hour twice daily Wound #9 Right,Posterior Lower Leg: Patient to wear own Velcro compression garment. - both legs Elevate legs to the level of the heart and pump ankles as often as possible Compression Pump: Use compression pump on left lower extremity for 30 minutes, twice daily. - 1 hour twice daily Compression Pump: Use compression pump on right lower extremity for 30 minutes, twice daily. - 1 hour twice daily Medical Decision Making Non-pressure chronic ulcer of left calf with necrosis of muscle 12/28/2017 Status: Improving Complications: since the patient was last here 3 weeks ago he was put back in a compression stocking with wraparound extrrmitease Interventions: continue silver alternate under compression -debridement of the wound done Lymphedema, not elsewhere classified 12/28/2017 Status: No change Complications: weight is up to 407. He is not been able to see his nephrologist because he is between insurances and applying for COBRA Interventions: he is back in his own compression stocking with a wraparound/extremities stocking externally. He is using his compression pumps once a day Non-pressure chronic ulcer of right calf limited to breakdown of skin 02/22/2018 Status: Improving Complications: leg is better now back in compression wraps. One open wound on the posterior calf Interventions: continue's overall [place] under compression wraps and  compression pumps #1 we managed to get him some donated silver alginate to put on the wound so at least he is putting something on the wound surface #2 I told him he needs to keep his compression wraps on Mclaughlinm. to p.m. and uses compression pumps twice a day. I don't think he was using anything at home #3 needs to follow up with his nephrologist. Unfortunately he is between insurances but  he is working on [employer] and thinks he'll have that by next week #4 will see him back in 2 weeks. [name], [name] ([id number]) #5 I am uncertain whether the current compression he is putting on the left leg [20/30 stockings plus wraparound extremities will be enough] we may need to put him back in 4 alert compression. I am very hopeful he will continue using his pumps twice a day Electronic Signature(s) Signed: 04/19/2018 5:35:06 PM By: Linton Ham MD Entered By: Linton Ham on 04/19/2018 11:29:56 Barcenas, Vidur A. ([id number]) -------------------------------------------------------------------------------- SuperBill Details Patient Name: Collantes, Marko A. Date of Service: 04/19/2018 Medical Record Number: [id number] Patient Account Number: [id number] Date of Birth/Sex: 12-20-1970 (47 y.o. M) Treating RN: Cornell Barman Primary Care Provider: Gaetano Net Other Clinician: Referring Provider: Gaetano Net Treating Provider/Extender: Tito Dine in Treatment: 16 Diagnosis Coding ICD-10 Codes Code Description [phone] Non-pressure chronic ulcer of left calf with necrosis of muscle I89.0 Lymphedema, not elsewhere classified I87.332 Chronic venous hypertension (idiopathic) with ulcer and inflammation of left lower extremity E11.622 Type 2 diabetes mellitus with other skin ulcer L97.211 Non-pressure chronic ulcer of right calf limited to breakdown of skin Facility Procedures CPT4 Code: [id number] Description: 59292 - DEB SUBQ TISSUE 20 SQ CM/< ICD-10 Diagnosis Description L97.223 Non-pressure chronic  ulcer of left calf with necrosis of mu Modifier: scle Quantity: 1 Physician Procedures CPT4 Code: 4462863 Description: 81771 - WC PHYS SUBQ TISS 20 SQ CM ICD-10 Diagnosis Description L97.223 Non-pressure chronic ulcer of left calf with necrosis of mu Modifier: scle Quantity: 1 Electronic Signature(s) Signed: 04/19/2018 5:35:06 PM By: Linton Ham MD Entered By: Linton Ham on 04/19/2018 11:30:14

## 2018-05-02 ENCOUNTER — Telehealth (INDEPENDENT_AMBULATORY_CARE_PROVIDER_SITE_OTHER): Payer: Self-pay | Admitting: Vascular Surgery

## 2018-05-02 NOTE — Telephone Encounter (Signed)
Will have to ask Dr. Delana Meyer if he has received the paperwork and signed it, once he returns to the office on Thursday.

## 2018-05-03 ENCOUNTER — Encounter: Payer: BLUE CROSS/BLUE SHIELD | Attending: Internal Medicine | Admitting: Internal Medicine

## 2018-05-03 DIAGNOSIS — E11622 Type 2 diabetes mellitus with other skin ulcer: Secondary | ICD-10-CM | POA: Insufficient documentation

## 2018-05-03 DIAGNOSIS — I890 Lymphedema, not elsewhere classified: Secondary | ICD-10-CM | POA: Insufficient documentation

## 2018-05-03 DIAGNOSIS — L97223 Non-pressure chronic ulcer of left calf with necrosis of muscle: Secondary | ICD-10-CM | POA: Diagnosis not present

## 2018-05-03 DIAGNOSIS — I129 Hypertensive chronic kidney disease with stage 1 through stage 4 chronic kidney disease, or unspecified chronic kidney disease: Secondary | ICD-10-CM | POA: Insufficient documentation

## 2018-05-03 DIAGNOSIS — E1140 Type 2 diabetes mellitus with diabetic neuropathy, unspecified: Secondary | ICD-10-CM | POA: Insufficient documentation

## 2018-05-03 DIAGNOSIS — I87332 Chronic venous hypertension (idiopathic) with ulcer and inflammation of left lower extremity: Secondary | ICD-10-CM | POA: Diagnosis not present

## 2018-05-03 DIAGNOSIS — N184 Chronic kidney disease, stage 4 (severe): Secondary | ICD-10-CM | POA: Insufficient documentation

## 2018-05-03 NOTE — Telephone Encounter (Signed)
I have it and was planning to bring it to the office tomorrow

## 2018-05-04 DIAGNOSIS — Z0289 Encounter for other administrative examinations: Secondary | ICD-10-CM

## 2018-05-04 NOTE — Progress Notes (Addendum)
KEELAN, TRIPODI ([id number]) Visit Report for 05/03/2018 Arrival Information Details Patient Name: [name] A. Date of Service: 05/03/2018 11:15 AM Medical Record Number: [id number] Patient Account Number: [id number] Date of Birth/Sex: 12/07/1970 (47 y.o. M) Treating RN: Roger Shelter Primary Care Forest Pruden: Gaetano Net Other Clinician: Referring Hieu Herms: Gaetano Net Treating Tene Gato/Extender: Tito Dine in Treatment: 18 Visit Information History Since Last Visit All ordered tests and consults were completed: No Patient Arrived: Ambulatory Added or deleted any medications: No Arrival Time: 11:25 Any new allergies or adverse reactions: No Accompanied By: self Had a fall or experienced change in No Transfer Assistance: None activities of daily living that may affect Patient Identification Verified: Yes risk of falls: Secondary Verification Process Yes Signs or symptoms of abuse/neglect since last visito No Completed: Hospitalized since last visit: No Patient Requires Transmission-Based No Implantable device outside of the clinic excluding No Precautions: cellular tissue based products placed in the center Patient Has Alerts: Yes since last visit: Patient Alerts: DM II Pain Present Now: No noncompressible bilateral Electronic Signature(s) Signed: 05/03/2018 12:03:29 PM By: Roger Shelter Entered By: Roger Shelter on 05/03/2018 11:29:12 [name] ([id number]) -------------------------------------------------------------------------------- Complex / Palliative Patient Assessment Details Patient Name: [name] A. Date of Service: 05/03/2018 11:15 AM Medical Record Number: [id number] Patient Account Number: [id number] Date of Birth/Sex: 03-28-1971 (47 y.o. M) Treating RN: Cornell Barman Primary Care Morine Kohlman: Gaetano Net Other Clinician: Referring Mattheu Brodersen: Gaetano Net Treating Edmund Holcomb/Extender: Tito Dine in Treatment:  18 Palliative Management Criteria Complex Wound Management Criteria Patient has remarkable or complex co-morbidities requiring medications or treatments that extend wound healing times. Examples: o Diabetes mellitus with chronic renal failure or end stage renal disease requiring dialysis o Advanced or poorly controlled rheumatoid arthritis o Diabetes mellitus and end stage chronic obstructive pulmonary disease o Active cancer with current chemo- or radiation therapy Care Approach Wound Care Plan: Complex Wound Management Electronic Signature(s) Signed: 05/04/2018 11:30:31 AM By: Gretta Cool, BSN, RN, CWS, Kim RN, BSN Signed: 05/10/2018 5:30:50 PM By: Linton Ham MD Entered By: Gretta Cool, BSN, RN, CWS, Kim on 05/04/2018 11:30:30 [name] Kitchen ([id number]) -------------------------------------------------------------------------------- Encounter Discharge Information Details Patient Name: [name], [name] Date of Service: 05/03/2018 11:15 AM Medical Record Number: [id number] Patient Account Number: [id number] Date of Birth/Sex: 1971/03/15 (47 y.o. M) Treating RN: Montey Hora Primary Care Noya Santarelli: Gaetano Net Other Clinician: Referring Ozell Juhasz: Gaetano Net Treating Alexys Gassett/Extender: Tito Dine in Treatment: 45 Encounter Discharge Information Items Discharge Condition: Stable Ambulatory Status: Ambulatory Discharge Destination: Home Transportation: Private Auto Accompanied By: self Schedule Follow-up Appointment: Yes Clinical Summary of Care: Electronic Signature(s) Signed: 05/03/2018 3:01:29 PM By: Montey Hora Entered By: Montey Hora on 05/03/2018 15:01:29 [name] A. ([id number]) -------------------------------------------------------------------------------- Lower Extremity Assessment Details Patient Name: [name], [name] Date of Service: 05/03/2018 11:15 AM Medical Record Number: [id number] Patient Account Number: [id number] Date of Birth/Sex:  1970/10/17 (47 y.o. M) Treating RN: Roger Shelter Primary Care Anastyn Ayars: Gaetano Net Other Clinician: Referring Dwanda Tufano: Gaetano Net Treating Raylei Losurdo/Extender: Tito Dine in Treatment: 18 Edema Assessment Assessed: [Left: No] [Right: No] Edema: [Left: Yes] [Right: Yes] Calf Left: Right: Point of Measurement: 36 cm From Medial Instep 72.5 cm 55.5 cm Ankle Left: Right: Point of Measurement: 14 cm From Medial Instep 53 cm 3953 cm Vascular Assessment Claudication: Claudication Assessment [Left:None] [Right:None] Pulses: Dorsalis Pedis Palpable: [Left:No] [Right:No] Doppler Audible: [Left:Yes] [Right:Yes] Posterior Tibial Extremity colors, hair growth, and conditions: Extremity Color: [Left:Hyperpigmented] [Right:Hyperpigmented] Hair Growth on Extremity: [Left:Yes] [Right:Yes] Temperature  of Extremity: [Left:Warm] [Right:Warm] Capillary Refill: [Left:< 3 seconds] [Right:< 3 seconds] Toe Nail Assessment Left: Right: Thick: Yes Yes Discolored: Yes Yes Deformed: Yes Yes Improper Length and Hygiene: Yes Yes Electronic Signature(s) Signed: 05/03/2018 12:03:29 PM By: Roger Shelter Entered By: Roger Shelter on 05/03/2018 11:46:56 Heikes, Jay A. ([id number]) -------------------------------------------------------------------------------- Multi Wound Chart Details Patient Name: [name] A. Date of Service: 05/03/2018 11:15 AM Medical Record Number: [id number] Patient Account Number: [id number] Date of Birth/Sex: 1970-09-30 (47 y.o. M) Treating RN: Cornell Barman Primary Care Lilliahna Schubring: Gaetano Net Other Clinician: Referring Sameeha Rockefeller: Gaetano Net Treating Tashawn Greff/Extender: Tito Dine in Treatment: 18 Vital Signs Height(in): 76 Pulse(bpm): 72 Weight(lbs): 409.5 Blood Pressure(mmHg): 195/85 Body Mass Index(BMI): 50 Temperature(F): 98.3 Respiratory Rate 18 (breaths/min): Photos: Wound Location: Left Lower Leg - Lateral Left Lower Leg -  Medial, Distal Right Lower Leg - Medial, Distal Wounding Event: Surgical Injury Gradually Appeared Gradually Appeared Primary Etiology: Cellulitis Lymphedema Diabetic Wound/Ulcer of the Lower Extremity Secondary Etiology: N/A N/A Lymphedema Comorbid History: Hypertension, Type II Hypertension, Type II Hypertension, Type II Diabetes, Neuropathy Diabetes, Neuropathy Diabetes, Neuropathy Date Acquired: 09/18/2017 04/25/2018 04/24/2018 Weeks of Treatment: 2 0 0 Wound Status: Open Open Open Clustered Wound: Yes No No Clustered Quantity: 3 N/A N/A Measurements L x W x D 3.5x6.5x0.2 3.5x3.8x0.2 1.5x1.2x0.1 (cm) Area (cm) : 17.868 10.446 1.414 Volume (cm) : 3.574 2.089 0.141 % Reduction in Area: -11.50% N/A 0.00% % Reduction in Volume: 25.70% N/A 0.00% Classification: Partial Thickness Full Thickness Without Grade 2 Exposed Support Structures Exudate Amount: Large Large Medium Exudate Type: Serous Serous Serous Exudate Color: amber amber amber Wound Margin: Distinct, outline attached Distinct, outline attached Distinct, outline attached Granulation Amount: Small (1-33%) Medium (34-66%) None Present (0%) Granulation Quality: Red, Pink Red, Pink N/A Necrotic Amount: Small (1-33%) Medium (34-66%) Large (67-100%) Necrotic Tissue: Adherent [place] ([id number]) Exposed Structures: Fascia: No Fat Layer (Subcutaneous Fascia: No Fat Layer (Subcutaneous Tissue) Exposed: Yes Fat Layer (Subcutaneous Tissue) Exposed: No Fascia: No Tissue) Exposed: No Tendon: No Tendon: No Tendon: No Muscle: No Muscle: No Muscle: No Joint: No Joint: No Joint: No Bone: No Bone: No Bone: No Epithelialization: None None None Debridement: N/A Debridement - Excisional N/A Pre-procedure N/A 12:05 N/A Verification/Time Out Taken: Tissue Debrided: N/A Subcutaneous, Slough N/A Level: N/A Skin/Subcutaneous Tissue N/A Debridement Area (sq cm): N/A 13.3  N/A Instrument: N/A Curette N/A Bleeding: N/A Moderate N/A Hemostasis Achieved: N/A Silver Nitrate N/A Debridement Treatment N/A Procedure was tolerated well N/A Response: Post Debridement N/A 3.5x3.8x0.2 N/A Measurements L x W x D (cm) Post Debridement Volume: N/A 2.089 N/A (cm) Periwound Skin Texture: Scarring: Yes Excoriation: Yes Excoriation: No Excoriation: No Induration: No Induration: No Induration: No Callus: No Callus: No Callus: No Crepitus: No Crepitus: No Crepitus: No Rash: No Rash: No Rash: No Scarring: No Scarring: No Periwound Skin Moisture: Maceration: No Maceration: No Maceration: No Dry/Scaly: No Dry/Scaly: No Dry/Scaly: No Periwound Skin Color: Atrophie Blanche: No Erythema: Yes Atrophie Blanche: No Cyanosis: No Atrophie Blanche: No Cyanosis: No Ecchymosis: No Cyanosis: No Ecchymosis: No Erythema: No Ecchymosis: No Erythema: No Hemosiderin Staining: No Hemosiderin Staining: No Hemosiderin Staining: No Mottled: No Mottled: No Mottled: No Pallor: No Pallor: No Pallor: No Rubor: No Rubor: No Rubor: No Erythema Location: N/A Circumferential N/A Temperature: N/A N/A No Abnormality Tenderness on Palpation: No No No Wound Preparation: Ulcer Cleansing: Ulcer Cleansing: Ulcer Cleansing: Rinsed/Irrigated with Saline Rinsed/Irrigated with Saline Rinsed/Irrigated with Saline Topical Anesthetic Applied:  Topical Anesthetic Applied: Topical Anesthetic Applied: Other: lidocaine 4% Other: lidocaine 4% Other: lidocaine 4% Procedures Performed: N/A Debridement N/A Wound Number: 2 9 N/A Photos: No Photos N/A [name] A. ([id number]) Wound Location: Left, Lateral, Posterior Lower Right, Posterior Lower Leg N/A Leg Wounding Event: Gradually Appeared Gradually Appeared N/A Primary Etiology: Diabetic Wound/Ulcer of the Lymphedema N/A Lower Extremity Secondary Etiology: Lymphedema Diabetic Wound/Ulcer of the N/A Lower  Extremity Comorbid History: N/A N/A N/A Date Acquired: 09/30/2017 03/08/2018 N/A Weeks of Treatment: 18 7 N/A Wound Status: Open Healed - Epithelialized N/A Clustered Wound: No No N/A Clustered Quantity: N/A N/A N/A Measurements L x W x D 2.2x7.5x0.3 0x0x0 N/A (cm) Area (cm) : 12.959 0 N/A Volume (cm) : 3.888 0 N/A % Reduction in Area: 54.20% 100.00% N/A % Reduction in Volume: 65.60% 100.00% N/A Classification: Grade 2 Full Thickness Without N/A Exposed Support Structures Exudate Amount: N/A N/A N/A Exudate Type: N/A N/A N/A Exudate Color: N/A N/A N/A Wound Margin: N/A N/A N/A Granulation Amount: N/A N/A N/A Granulation Quality: N/A N/A N/A Necrotic Amount: N/A N/A N/A Necrotic Tissue: N/A N/A N/A Exposed Structures: N/A N/A N/A Epithelialization: N/A N/A N/A Debridement: N/A N/A N/A Tissue Debrided: N/A N/A N/A Level: N/A N/A N/A Debridement Area (sq cm): N/A N/A N/A Instrument: N/A N/A N/A Bleeding: N/A N/A N/A Hemostasis Achieved: N/A N/A N/A Debridement Treatment N/A N/A N/A Response: Post Debridement N/A N/A N/A Measurements L x W x D (cm) Post Debridement Volume: N/A N/A N/A (cm) Periwound Skin Texture: No Abnormalities Noted No Abnormalities Noted N/A Periwound Skin Moisture: No Abnormalities Noted No Abnormalities Noted N/A Periwound Skin Color: No Abnormalities Noted No Abnormalities Noted N/A Erythema Location: N/A N/A N/A Temperature: N/A N/A N/A Tenderness on Palpation: No No N/A Wound Preparation: N/A N/A N/A Procedures Performed: N/A N/A N/A Treatment Notes FARD, BORUNDA ([id number]) Electronic Signature(s) Signed: 05/03/2018 4:51:46 PM By: Linton Ham MD Entered By: Linton Ham on 05/03/2018 12:25:42 Sidman, [name] Kitchen ([id number]) -------------------------------------------------------------------------------- Multi-Disciplinary Care Plan Details Patient Name: Loncar, Almer A. Date of Service: 05/03/2018 11:15 AM Medical Record Number:  [id number] Patient Account Number: [id number] Date of Birth/Sex: 16-May-1971 (47 y.o. M) Treating RN: Cornell Barman Primary Care Kerline Trahan: Gaetano Net Other Clinician: Referring Aarianna Hoadley: Gaetano Net Treating Brynnleigh Mcelwee/Extender: Tito Dine in Treatment: 42 Active Inactive ` Abuse / Safety / Falls / Self Care Management Nursing Diagnoses: Potential for falls Goals: Patient will remain injury free related to falls Date Initiated: 12/28/2017 Target Resolution Date: 04/08/2018 Goal Status: Active Interventions: Assess fall risk on admission and as needed Notes: ` Nutrition Nursing Diagnoses: Impaired glucose control: actual or potential Goals: Patient/caregiver verbalizes understanding of need to maintain therapeutic glucose control per primary care physician Date Initiated: 12/28/2017 Target Resolution Date: 04/08/2018 Goal Status: Active Interventions: Provide education on elevated blood sugars and impact on wound healing Notes: ` Orientation to the Wound Care Program Nursing Diagnoses: Knowledge deficit related to the wound healing center program Goals: Patient/caregiver will verbalize understanding of the [facility] Date Initiated: 12/28/2017 Target Resolution Date: 04/08/2018 Goal Status: Active Interventions: Quayle, Jeri A. ([id number]) Provide education on orientation to the wound center Notes: ` Venous Leg Ulcer Nursing Diagnoses: Knowledge deficit related to disease process and management Goals: Patient will maintain optimal edema control Date Initiated: 03/27/2018 Target Resolution Date: 04/27/2018 Goal Status: Active Interventions: Assess peripheral edema status every visit. Compression as ordered Treatment Activities: Therapeutic compression applied : 03/22/2018 Notes: ` Wound/Skin Impairment Nursing Diagnoses: Impaired tissue  integrity Goals: Ulcer/skin breakdown will heal within 14 weeks Date Initiated: 12/28/2017 Target  Resolution Date: 04/08/2018 Goal Status: Active Interventions: Assess patient/caregiver ability to obtain necessary supplies Assess patient/caregiver ability to perform ulcer/skin care regimen upon admission and as needed Assess ulceration(s) every visit Notes: Electronic Signature(s) Signed: 05/04/2018 5:22:10 PM By: Gretta Cool, BSN, RN, CWS, Kim RN, BSN Entered By: Gretta Cool, BSN, RN, CWS, Kim on 05/03/2018 12:05:01 Ybanez, Ivery A. ([id number]) -------------------------------------------------------------------------------- Pain Assessment Details Patient Name: Ramseyer, Tyrann A. Date of Service: 05/03/2018 11:15 AM Medical Record Number: [id number] Patient Account Number: [id number] Date of Birth/Sex: 03-Sep-1970 (47 y.o. M) Treating RN: Roger Shelter Primary Care Puja Caffey: Gaetano Net Other Clinician: Referring Saia Derossett: Gaetano Net Treating Jonte Shiller/Extender: Tito Dine in Treatment: 22 Active Problems Location of Pain Severity and Description of Pain Patient Has Paino No Site Locations Pain Management and Medication Current Pain Management: Electronic Signature(s) Signed: 05/03/2018 12:03:29 PM By: Roger Shelter Entered By: Roger Shelter on 05/03/2018 11:29:18 Melott, Alondra A. ([id number]) -------------------------------------------------------------------------------- Patient/Caregiver Education Details Patient Name: Meany, Kassem A. Date of Service: 05/03/2018 11:15 AM Medical Record Number: [id number] Patient Account Number: [id number] Date of Birth/Gender: 1970-11-17 (47 y.o. M) Treating RN: Montey Hora Primary Care Physician: Gaetano Net Other Clinician: Referring Physician: Gaetano Net Treating Physician/Extender: Tito Dine in Treatment: 35 Education Assessment Education Provided To: Patient Education Topics Provided Venous: Handouts: Other: leg elevation Methods: Explain/Verbal Responses: State content correctly Electronic  Signature(s) Signed: 05/03/2018 4:57:10 PM By: Montey Hora Entered By: Montey Hora on 05/03/2018 15:01:44 Busler, Prince A. ([id number]) -------------------------------------------------------------------------------- Wound Assessment Details Patient Name: Ogden, Luka A. Date of Service: 05/03/2018 11:15 AM Medical Record Number: [id number] Patient Account Number: [id number] Date of Birth/Sex: May 10, 1971 (47 y.o. M) Treating RN: Roger Shelter Primary Care Tareq Dwan: Gaetano Net Other Clinician: Referring Meggan Dhaliwal: Gaetano Net Treating Giavanni Zeitlin/Extender: Tito Dine in Treatment: 18 Wound Status Wound Number: 12 Primary Etiology: Cellulitis Wound Location: Left Lower Leg - Lateral Wound Status: Open Wounding Event: Surgical Injury Comorbid Hypertension, Type II Diabetes, History: Neuropathy Date Acquired: 09/18/2017 Weeks Of Treatment: 2 Clustered Wound: Yes Photos Photo Uploaded By: Roger Shelter on 05/03/2018 11:53:17 Wound Measurements Length: (cm) 3.5 Width: (cm) 6.5 Depth: (cm) 0.2 Clustered Quantity: 3 Area: (cm) 17.868 Volume: (cm) 3.574 % Reduction in Area: -11.5% % Reduction in Volume: 25.7% Epithelialization: None Tunneling: No Undermining: No Wound Description Classification: Partial Thickness Wound Margin: Distinct, outline attached Exudate Amount: Large Exudate Type: Serous Exudate Color: amber Foul Odor After Cleansing: No Slough/Fibrino Yes Wound Bed Granulation Amount: Small (1-33%) Exposed Structure Granulation Quality: Red, Pink Fascia Exposed: No Necrotic Amount: Small (1-33%) Fat Layer (Subcutaneous Tissue) Exposed: No Necrotic Quality: Adherent Slough Tendon Exposed: No Muscle Exposed: No Joint Exposed: No Bone Exposed: No Periwound Skin Texture [name] A. ([id number]) Texture Color No Abnormalities Noted: No No Abnormalities Noted: No Callus: No Atrophie Blanche: No Crepitus: No Cyanosis: No Excoriation:  No Ecchymosis: No Induration: No Erythema: No Rash: No Hemosiderin Staining: No Scarring: Yes Mottled: No Pallor: No Moisture Rubor: No No Abnormalities Noted: No Dry / Scaly: No Maceration: No Wound Preparation Ulcer Cleansing: Rinsed/Irrigated with Saline Topical Anesthetic Applied: Other: lidocaine 4%, Electronic Signature(s) Signed: 05/03/2018 12:03:29 PM By: Roger Shelter Entered By: Roger Shelter on 05/03/2018 11:40:42 Lonigro, Munachimso A. ([id number]) -------------------------------------------------------------------------------- Wound Assessment Details Patient Name: [name] A. Date of Service: 05/03/2018 11:15 AM Medical Record Number: [id number] Patient Account Number: [id number] Date of Birth/Sex: 1970-12-26 (47 y.o. M) Treating RN: Roger Shelter Primary Care Rashaunda Rahl:  Gaetano Net Other Clinician: Referring Kagan Hietpas: Gaetano Net Treating Kallen Delatorre/Extender: Tito Dine in Treatment: 18 Wound Status Wound Number: 13 Primary Etiology: Lymphedema Wound Location: Left Lower Leg - Medial, Distal Wound Status: Open Wounding Event: Gradually Appeared Comorbid Hypertension, Type II Diabetes, History: Neuropathy Date Acquired: 04/25/2018 Weeks Of Treatment: 0 Clustered Wound: No Photos Photo Uploaded By: Roger Shelter on 05/03/2018 11:54:27 Wound Measurements Length: (cm) 3.5 Width: (cm) 3.8 Depth: (cm) 0.2 Area: (cm) 10.446 Volume: (cm) 2.089 % Reduction in Area: % Reduction in Volume: Epithelialization: None Tunneling: No Undermining: No Wound Description Full Thickness Without Exposed Support Foul Classification: Structures Slou Wound Margin: Distinct, outline attached Exudate Large Amount: Exudate Type: Serous Exudate Color: amber Odor After Cleansing: No gh/Fibrino Yes Wound Bed Granulation Amount: Medium (34-66%) Exposed Structure Granulation Quality: Red, Pink Fascia Exposed: No Necrotic Amount: Medium  (34-66%) Fat Layer (Subcutaneous Tissue) Exposed: Yes Necrotic Quality: Adherent Slough Tendon Exposed: No Muscle Exposed: No Joint Exposed: No Bone Exposed: No [name] A. ([id number]) Periwound Skin Texture Texture Color No Abnormalities Noted: No No Abnormalities Noted: No Callus: No Atrophie Blanche: No Crepitus: No Cyanosis: No Excoriation: Yes Ecchymosis: No Induration: No Erythema: Yes Rash: No Erythema Location: Circumferential Scarring: No Hemosiderin Staining: No Mottled: No Moisture Pallor: No No Abnormalities Noted: No Rubor: No Dry / Scaly: No Maceration: No Wound Preparation Ulcer Cleansing: Rinsed/Irrigated with Saline Topical Anesthetic Applied: Other: lidocaine 4%, Electronic Signature(s) Signed: 05/03/2018 12:03:29 PM By: Roger Shelter Entered By: Roger Shelter on 05/03/2018 11:39:59 Baisley, Jaylend A. ([id number]) -------------------------------------------------------------------------------- Wound Assessment Details Patient Name: Courser, Bobie A. Date of Service: 05/03/2018 11:15 AM Medical Record Number: [id number] Patient Account Number: [id number] Date of Birth/Sex: 28-Jan-1971 (47 y.o. M) Treating RN: Roger Shelter Primary Care Bradleigh Sonnen: Gaetano Net Other Clinician: Referring Gretna Bergin: Gaetano Net Treating Shawanna Zanders/Extender: Tito Dine in Treatment: 18 Wound Status Wound Number: 14 Primary Etiology: Diabetic Wound/Ulcer of the Lower Extremity Wound Location: Right Lower Leg - Medial, Distal Secondary Lymphedema Wounding Event: Gradually Appeared Etiology: Date Acquired: 04/24/2018 Wound Status: Open Weeks Of Treatment: 0 Comorbid History: Hypertension, Type II Diabetes, Clustered Wound: No Neuropathy Photos Photo Uploaded By: Roger Shelter on 05/03/2018 11:53:52 Wound Measurements Length: (cm) 1.5 Width: (cm) 1.2 Depth: (cm) 0.1 Area: (cm) 1.414 Volume: (cm) 0.141 % Reduction in Area: 0% %  Reduction in Volume: 0% Epithelialization: None Tunneling: No Undermining: No Wound Description Classification: Grade 2 Wound Margin: Distinct, outline attached Exudate Amount: Medium Exudate Type: Serous Exudate Color: amber Foul Odor After Cleansing: No Slough/Fibrino Yes Wound Bed Granulation Amount: None Present (0%) Exposed Structure Necrotic Amount: Large (67-100%) Fascia Exposed: No Necrotic Quality: Eschar, Adherent Slough Fat Layer (Subcutaneous Tissue) Exposed: No Tendon Exposed: No Muscle Exposed: No Joint Exposed: No Bone Exposed: No Periwound Skin Texture [name] A. ([id number]) Texture Color No Abnormalities Noted: No No Abnormalities Noted: No Callus: No Atrophie Blanche: No Crepitus: No Cyanosis: No Excoriation: No Ecchymosis: No Induration: No Erythema: No Rash: No Hemosiderin Staining: No Scarring: No Mottled: No Pallor: No Moisture Rubor: No No Abnormalities Noted: No Dry / Scaly: No Temperature / Pain Maceration: No Temperature: No Abnormality Wound Preparation Ulcer Cleansing: Rinsed/Irrigated with Saline Topical Anesthetic Applied: Other: lidocaine 4%, Electronic Signature(s) Signed: 05/03/2018 12:03:29 PM By: Roger Shelter Entered By: Roger Shelter on 05/03/2018 11:44:15 [name] A. ([id number]) -------------------------------------------------------------------------------- Wound Assessment Details Patient Name: Yakubov, Cheston A. Date of Service: 05/03/2018 11:15 AM Medical Record Number: [id number] Patient Account Number: [id number] Date of Birth/Sex: 1970/12/26 (47 y.o.  M) Treating RN: Roger Shelter Primary Care Leena Tiede: Gaetano Net Other Clinician: Referring Mailani Degroote: Gaetano Net Treating Kimball Manske/Extender: Tito Dine in Treatment: 18 Wound Status Wound Number: 2 Primary Etiology: Diabetic Wound/Ulcer of the Lower Extremity Wound Location: Left, Lateral, Posterior Lower Leg Secondary  Lymphedema Wounding Event: Gradually Appeared Etiology: Date Acquired: 09/30/2017 Wound Status: Open Weeks Of Treatment: 18 Clustered Wound: No Photos Photo Uploaded By: Roger Shelter on 05/03/2018 11:55:11 Wound Measurements Length: (cm) 2.2 Width: (cm) 7.5 Depth: (cm) 0.3 Area: (cm) 12.959 Volume: (cm) 3.888 % Reduction in Area: 54.2% % Reduction in Volume: 65.6% Wound Description Classification: Grade 2 Periwound Skin Texture Texture Color No Abnormalities Noted: No No Abnormalities Noted: No Moisture No Abnormalities Noted: No Electronic Signature(s) Signed: 05/03/2018 12:03:29 PM By: Roger Shelter Entered By: Roger Shelter on 05/03/2018 11:37:24 Napier, Jony A. ([id number]) -------------------------------------------------------------------------------- Wound Assessment Details Patient Name: Watkinson, Khylin A. Date of Service: 05/03/2018 11:15 AM Medical Record Number: [id number] Patient Account Number: [id number] Date of Birth/Sex: 1971/01/02 (47 y.o. M) Treating RN: Roger Shelter Primary Care Arnetha [name]: Gaetano Net Other Clinician: Referring Cheronda Erck: Gaetano Net Treating Gianfranco Araki/Extender: Tito Dine in Treatment: 18 Wound Status Wound Number: 9 Primary Etiology: Lymphedema Wound Location: Right, Posterior Lower Leg Secondary Diabetic Wound/Ulcer of the Lower Etiology: Extremity Wounding Event: Gradually Appeared Wound Status: Healed - Epithelialized Date Acquired: 03/08/2018 Weeks Of Treatment: 7 Clustered Wound: No Wound Measurements Length: (cm) 0 Width: (cm) 0 Depth: (cm) 0 Area: (cm) 0 Volume: (cm) 0 % Reduction in Area: 100% % Reduction in Volume: 100% Wound Description Full Thickness Without Exposed Support Classification: Structures Periwound Skin Texture Texture Color No Abnormalities Noted: No No Abnormalities Noted: No Moisture No Abnormalities Noted: No Electronic Signature(s) Signed: 05/03/2018 12:03:29 PM  By: Roger Shelter Entered By: Roger Shelter on 05/03/2018 11:41:48 Darden, Broden A. ([id number]) -------------------------------------------------------------------------------- Vitals Details Patient Name: Longnecker, Jaikob A. Date of Service: 05/03/2018 11:15 AM Medical Record Number: [id number] Patient Account Number: [id number] Date of Birth/Sex: 1971-06-03 (47 y.o. M) Treating RN: Roger Shelter Primary Care Demarion Pondexter: Gaetano Net Other Clinician: Referring Tyffany Waldrop: Gaetano Net Treating Khaliel Morey/Extender: Tito Dine in Treatment: 18 Vital Signs Time Taken: 11:29 Temperature (F): 98.3 Height (in): 76 Pulse (bpm): 72 Weight (lbs): 409.5 Respiratory Rate (breaths/min): 18 Source: Measured Blood Pressure (mmHg): 195/85 Body Mass Index (BMI): 49.8 Reference Range: 80 - 120 mg / dl Electronic Signature(s) Signed: 05/03/2018 12:03:29 PM By: Roger Shelter Entered By: Roger Shelter on 05/03/2018 11:33:57

## 2018-05-05 NOTE — Telephone Encounter (Signed)
I reminded Dr. Delana Meyer about the paperwork, but I still haven't received it.

## 2018-05-07 NOTE — Progress Notes (Signed)
[name] ([id number]) Visit Report for 05/03/2018 Debridement Details Patient Name: [name], [name] Date of Service: 05/03/2018 11:15 AM Medical Record Number: [id number] Patient Account Number: [id number] Date of Birth/Sex: 04/09/1971 (47 y.o. M) Treating RN: [name] Primary Care Provider: Gaetano [name] Other Clinician: Referring Provider: Gaetano [name] Treating Provider/Extender: [name] in Treatment: 18 Debridement Performed for Wound #13 Left,Distal,Medial Lower Leg Assessment: Performed By: Physician [name] Dillon, [name] Debridement Type: Debridement Pre-procedure Verification/Time Yes - 12:05 Out Taken: Start Time: 12:05 Total Area Debrided (L x W): 3.5 (cm) x 3.8 (cm) = 13.3 (cm) Tissue and other material Viable, Non-Viable, Slough, Subcutaneous, Slough debrided: Level: Skin/Subcutaneous Tissue Debridement Description: Excisional Instrument: Curette Bleeding: Moderate Hemostasis Achieved: Silver Nitrate End Time: 12:07 Response to Treatment: Procedure was tolerated well Level of Consciousness: Awake and Alert Post Debridement Measurements of Total Wound Length: (cm) 3.5 Width: (cm) 3.8 Depth: (cm) 0.2 Volume: (cm) 2.089 Character of Wound/Ulcer Post Debridement: Requires Further Debridement Post Procedure Diagnosis Same as Pre-procedure Electronic Signature(s) Signed: 05/03/2018 4:51:46 PM By: [name] Signed: 05/04/2018 5:22:10 PM By: [name], [name] Entered By: [name] 12:26:19 [name], [name] ([id number]) -------------------------------------------------------------------------------- HPI Details Patient Name: [name], [name] Date of Service: 05/03/2018 11:15 AM Medical Record Number: [id number] Patient Account Number: [id number] Date of Birth/Sex: 1971/08/14 (47 y.o. M) Treating RN: [name] Primary Care Provider: Gaetano [name] Other Clinician: Referring Provider: Gaetano [name] Treating  Provider/Extender: [name] in Treatment: 18 History of Present Illness HPI Description: 12/28/17; this is a now 47 year old man who is a type II diabetic. He was hospitalized from 10/01/17 through 10/19/17. He had an MSSA soft tissue and skin infection. 2 open areas on the left leg were identified he has a smaller area on the left medial calf superiorly just below the knee and a wound just above the left ankle on the posterior medial aspect. I think both of these were surgical IandD sites when he was in the hospital. He was discharged with a wound VAC at that point however this is since been taken off. He follows with Dr. Ola [name] for the [facility] and he is still on chronic Keflex at 500 twice a day. At that time he was hospitalized his hemoglobin A1c was 15.1 however if I'm reading his endocrinologist notes correctly that is improved. He has been following with [name] at vein and vascular and he has been applying calcium alginate and Unna boots. He has home health changing the dressing. They have also been attempting to get him external compression pumps although the patient is unaware whether they've been approved by insurance at this point. as mentioned he has a smaller clean wound on the right lateral calf just below the knee and he has a much larger area just above the left ankle medially and posteriorly. Our intake nurse reported greenish purulent looking drainage.the patient did have surgical material sent to pathology in February. This showed chronic abscess The patient also has lymphedema stage III in the left greater than right lower extremities. He has a history of blisters with wounds but these of all were always healed. The patient thinks that the lymphedema may have been present since he was about 47 years old i.e. about 30 years. He does not have graded pressure stockings and has not worn stockings. He does not have a distant history of DVT PE or phlebitis. He has not  been systemically unwell fever no chills.  He states that his Lasix is recently been reduced. He tells me his kidney function is at "30%" and he has been followed by Dr. Candiss [name] of nephrology. At one point he was on Lasix 80 twice a day however that's been cut back and he is now on Lasix at 20 twice a day. The patient has a history of PAD listed in his records although he comes from [name] I don't think is felt to have significant PAD. ABIs in our clinic were noncompressible bilaterally. 01/04/18; patient has a large wound on the left lateral lower calf and a small wound on the left medial upper calf. He has been to see his nephrologist who changed him to Demadex 40 mg a day. I'm hopeful this will help with his systemic fluid overload. He also has stage III lymphedema. Really no change in the 2 wounds since last week 01/11/18; the patient is down 13 pounds. He put his stage III lymphedema left leg in 4K compression last week and there is less edema fluid however we still haven't been able to communicate with home health but apparently it is [facility] but the dressings have not been changed. The patient noted an odor last week. He is also had compression pumps ordered by [facility] this as a not completed the paperwork stage. 01/18/18; patient continues to lose weight. Stage III lymphedema in the left greater than right leg under for alert compression. The major wound is on the left lateral ankle area. He apparently has bilateral compression pumps being brought to his house, these were ordered by [facility] Notable for the fact today he had some blisters on the right anterior leg together with some skin nodules. This is no doubt secondary to severe lymphedema. 01/25/18; the patient has obtained his compression pumps and is using them per vein and vascular instructions 3 times a day for an hour. He also saw [name] of vein and vascular. He was felt to have venous  insufficiency but did not suggest any intervention also improved edema. It was suggested that he have compression stockings 20-30 mm on a daily basis in addition to compression pumps. The patient arrives in clinic today with a layer of unna under for layer compression. he seems to have some trouble with the degree of compression. He has open areas on the left lateral ankle area which is his major wound left upper medial calf and a superficial open area on the right anterior shin area which was blistered last week. He has skin changes on the right anterior Crisman, Barclay A. ([id number]) calf which I think are no doubt secondary to lymphedema skin nodules etc. 02/01/18; the patient comes in telling us his nephrologist have to his torsemide. Unfortunately today he is put on 7 pounds by our scales. He has blisters all over the anterior and medial part of his right calf and a new open wound. He also has soupy green drainage coming out of the left lateral calf /ankle wound. 02/08/18; culture I did last week of the left lateral ankle wound grew both Pseudomonas and Morganella. He is on Keflex from Dr. Ola [name] in the hospital. I will need to review these notes.in any case Keflex is not going to cover these 2 organisms. I'm probably going to added ciprofloxacin today for 1 week. A lot of drainage that looks purulent last week. He is not complaining of pain however he has managed to put on 10 pounds in 2 weeks by our  scales in this clinic. He is going to see his nephrologist tomorrow 02/15/18; he completed the ciprofloxacin I gave him last week. Notable that he is up to 379 pounds today which is up 16 pounds from 2 weeks ago. He is complaining of orthopnea but doesn't have any chest pain. 02/22/18; he continues to have weight gain. R intake nurse reports again purulent green drainage coming out of the lateral wound on the lateral left calf. He has small open area on the right anterior leg. His torsemide was  increased to 2 tablets a day I believe this is 40 mg last week in response to the call admitted to Dr. Keturah Barre office. He follows up with Dr. Candiss [name] and Dr. Delana Meyer tomorrow 03/01/18 his weight essentially stable today at 383 pounds. Drainage out of the left lateral wound on the lower left calf/ankle is a lot less. Culture last time grew Pseudomonas. I put him on cefdinir. He has been to Dr. Keturah Barre office no adjustments in his diuretics. Dr. Nicoletta Dress prescribed a wraparound stocking for the right leg.there is no open area on the right leg. He has a superficial area on the left medial calf, left posterior calf and in the large area on the left lateral however this looks better 03/15/18; weight is not up to 393 pounds. He saw his nephrologist yesterday Dr. Candiss [name] will increase the Demadex I'm hopeful this will help with the edema control. I'm using silver alginate to all his wounds. In particular the left lateral ankle looks better. oUnfortunately he has new open areas on the right lateral calf that will include use of his compression stocking at least in the short to medium term. He has new wounds o3 on the right lateral calf. One of these has some size however all numerous superficial 03/22/18; his weight is stabilized a bit. Just adjustment of his diuretics by his nephrologist. There is no doubt he has some degree of systemic fluid overload on top of severe left greater than right lymphedema. 2 weeks ago tried to transition him to stockings on the right leg however he developed re-breakdown of skin on the right leg and we had to put him back in compression last week. He also uses external compression pumps and claims to be compliant Continued concern about his depression today 03/29/18; several ongoing issues with this patient; oHe no longer has home health coverage apparently secondary to a lapse in insurance. He is apparently transitioning from short for long-term disability. [facility] was  changing his compression wraps on Monday and Friday. oHe has gained 10 pounds since last week oSees [name] tomorrow oSaw his primary doctor last week about the depression. It sounds as though he declined pharmacologic management. He seems somewhat better today. We were concerned last week when he came. Somewhat better today oHe is using his compression pumps once a day at home, I have asked for twice a day if possible especially on the left leg oFollows up with his nephrologist in mid-August. He is managing his diuretic for I think stage IV chronic renal failure oParadoxically his wounds actually look better 04/19/18; the patient has not been seen since I last saw him 3 weeks ago. He saw Dr. Delana Meyer of vascular surgery on 03/30/18 I believe he put him in a 20/30 stocking bilaterally with a wraparound extremitease stocking. He has not been putting anything specifically on the wound. More problematic than that he has not been wearing the stockings he is at home. He has been  using his external compression pumps once per day according to him on a rare occasion twice On a psychosocial level the patient is now on long-term disability and is applying for COBRA therefore he is between insurances. He has not been able to follow up with Dr. Candiss [name] who is his nephrologist as a result. As noted his weight is up to 407 pounds today. He promises me he'll follow-up with Dr. Candiss [name] 05/03/18; he hasn't been here in 2 weeks now. He apparently has been wearing a compression sock on the left leg. Massive increase in edema 3 large open wounds on the left anterior leg that were probably blisters. Significant deterioration in the left lateral ankle wound that we've been doing as his most problematic wound. He still does not have his insurance issues wrapped up. His weight is well over 400 pounds. Electronic Signature(s) Signed: 05/03/2018 4:51:46 PM By: [name] Entered By: [name]  12:29:49 Pho, [name] ([id number]) [name], [name] Kitchen ([id number]) -------------------------------------------------------------------------------- Physical Exam Details Patient Name: Pesta, Jaleen A. Date of Service: 05/03/2018 11:15 AM Medical Record Number: [id number] Patient Account Number: [id number] Date of Birth/Sex: 1970-10-16 (47 y.o. M) Treating RN: [name] Primary Care Provider: Gaetano [name] Other Clinician: Referring Provider: Gaetano [name] Treating Provider/Extender: [name] in Treatment: 18 Constitutional Patient is hypertensive.. Pulse regular and within target range for patient.[name] Respirations regular, non-labored and within target range.. Temperature is normal and within the target range for the patient.[name] appears in no distress. Notes wound exam; his large wound on the left lateral calf has expanded. Nonviable protruding subcutaneous tissue around the circumference. Debrided with a #5 curet. Hemostasis with silver nitrate and direct pressure. Wound surface also much less viable than when I saw this 2 weeks ago. Considerable increase in the edema in the left leg. I'm not really clear why he has been wrapping his leg and I think it was a stocking within the extremitease circumference.there is no evidence of cellulitis or DVT think this is simply inadequate compression. Electronic Signature(s) Signed: 05/03/2018 4:51:46 PM By: [name] Entered By: [name] 12:37:13 Benton, [name] Kitchen ([id number]) -------------------------------------------------------------------------------- Physician Orders Details Patient Name: Pevey, Kennon A. Date of Service: 05/03/2018 11:15 AM Medical Record Number: [id number] Patient Account Number: [id number] Date of Birth/Sex: 02-20-1971 (47 y.o. M) Treating RN: [name] Primary Care Provider: Gaetano [name] Other Clinician: Referring Provider: Gaetano [name] Treating Provider/Extender: [name] in Treatment: 105 Verbal / Phone Orders: No Diagnosis Coding Wound Cleansing Wound #12 Left,Lateral Lower Leg o Clean wound with Normal Saline. o Cleanse wound with mild soap and water Wound #13 Left,Distal,Medial Lower Leg o Clean wound with Normal Saline. o Cleanse wound with mild soap and water Wound #14 Right,Distal,Medial Lower Leg o Clean wound with Normal Saline. o Cleanse wound with mild soap and water Wound #2 Left,Lateral,Posterior Lower Leg o Clean wound with Normal Saline. o Cleanse wound with mild soap and water Anesthetic (add to Medication List) Wound #12 Left,Lateral Lower Leg o Topical Lidocaine 4% cream applied to wound bed prior to debridement (In Clinic Only). Wound #13 Left,Distal,Medial Lower Leg o Topical Lidocaine 4% cream applied to wound bed prior to debridement (In Clinic Only). Wound #14 Right,Distal,Medial Lower Leg o Topical Lidocaine 4% cream applied to wound bed prior to debridement (In Clinic Only). Wound #2 Left,Lateral,Posterior Lower Leg o Topical Lidocaine 4% cream applied to wound bed prior to debridement (In Clinic Only). Primary Wound Dressing Wound #12  Left,Lateral Lower Leg o Silver Alginate Wound #13 Left,Distal,Medial Lower Leg o Silver Alginate Wound #14 Right,Distal,Medial Lower Leg o Silver Alginate Wound #2 Left,Lateral,Posterior Lower Leg o Silver Alginate Secondary Dressing [name], [name] ([id number]) Wound #12 Left,Lateral Lower Leg o Other - xsorb Wound #13 Left,Distal,Medial Lower Leg o Other - xsorb Wound #14 Right,Distal,Medial Lower Leg o Other - xsorb Wound #2 Left,Lateral,Posterior Lower Leg o Other - xsorb Dressing Change Frequency Wound #12 Left,Lateral Lower Leg o Change dressing every week Wound #13 Left,Distal,Medial Lower Leg o Change dressing every week Wound #14 Right,Distal,Medial Lower Leg o Change dressing every week Wound #2  Left,Lateral,Posterior Lower Leg o Change dressing every week Follow-up Appointments Wound #12 Left,Lateral Lower Leg o Return Appointment in 1 week. o Nurse Visit as needed Wound #13 Left,Distal,Medial Lower Leg o Return Appointment in 1 week. o Nurse Visit as needed Wound #14 Right,Distal,Medial Lower Leg o Return Appointment in 1 week. o Nurse Visit as needed Wound #2 Left,Lateral,Posterior Lower Leg o Return Appointment in 1 week. o Nurse Visit as needed Edema Control Wound #12 Left,Lateral Lower Leg o 4-Layer Compression System - Left Lower Extremity. Wound #13 Left,Distal,Medial Lower Leg o 4-Layer Compression System - Left Lower Extremity. Wound #14 Right,Distal,Medial Lower Leg o 4-Layer Compression System - Left Lower Extremity. Wound #2 Left,Lateral,Posterior Lower Leg o 4-Layer Compression System - Left Lower Extremity. ELDRED, SOOY ([id number]) Electronic Signature(s) Signed: 05/03/2018 4:51:46 PM By: [name] Signed: 05/04/2018 5:22:10 PM By: [name], [name] Entered By: [name], [name] on [name] 12:11:16 Difatta, [name] Kitchen ([id number]) -------------------------------------------------------------------------------- Problem List Details Patient Name: [name], [name] Date of Service: 05/03/2018 11:15 AM Medical Record Number: [id number] Patient Account Number: [id number] Date of Birth/Sex: 07-30-1971 (47 y.o. 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 ([id number]) -------------------------------------------------------------------------------- Progress Note Details Patient Name: [name], [name] Date of Service: 05/03/2018 11:15 AM Medical Record Number: [id number] Patient Account Number: [id number] Date of Birth/Sex: 1971/05/08 (46 y.o. M) Treating RN: [name] Primary Care Provider: Gaetano [name] Other Clinician: Referring Provider: Gaetano [name] Treating Provider/Extender: [name] in Treatment: 18 Subjective History of Present Illness (HPI) 12/28/17; this is a now 47 year old man who is a type II diabetic. He was hospitalized from 10/01/17 through 10/19/17. He had an MSSA soft tissue and skin infection. 2 open areas on the left leg were identified he has a smaller area on the left medial calf superiorly just below the knee and a wound just above the left ankle on the posterior medial aspect. I think both of these were surgical IandD sites when he was in the hospital. He was discharged with a wound VAC at that point however this is since been taken off. He follows with Dr.  Fitzgerald for the MSSA and he is still on chronic Keflex at 500 twice a day. At that time he was hospitalized his hemoglobin A1c was 15.1 however if I'm reading his endocrinologist notes correctly that is improved. He has been following with [name] at vein [facility] and he has been applying calcium alginate and Unna boots. He has home health changing the dressing. They have also been attempting to get him external  compression pumps although the patient is unaware whether they've been approved by insurance at this point. as mentioned he has a smaller clean wound on the right lateral calf just below the knee and he has a much larger area just above the left ankle medially and posteriorly. Our intake nurse reported greenish purulent looking drainage.the patient did have surgical material sent to pathology in February. This showed chronic abscess The patient also has lymphedema stage III in the left greater than right lower extremities. He has a history of blisters with wounds but these of all were always healed. The patient thinks that the lymphedema may have been present since he was about 47 years old i.e. about 30 years. He does not have graded pressure stockings and has not worn stockings. He does not have a distant history of DVT PE or phlebitis. He has not been systemically unwell fever no chills. He states that his Lasix is recently been reduced. He tells me his kidney function is at "30%" and he has been followed by Dr. Candiss [name] of nephrology. At one point he was on Lasix 80 twice a day however that's been cut back and he is now on Lasix at 20 twice a day. The patient has a history of PAD listed in his records although he comes from [name] I don't think is felt to have significant PAD. ABIs in our clinic were noncompressible bilaterally. 01/04/18; patient has a large wound on the left lateral lower calf and a small wound on the left medial upper calf. He has been to see his nephrologist who changed him to Demadex 40 mg a day. I'm hopeful this will help with his systemic fluid overload. He also has stage III lymphedema. Really no change in the 2 wounds since last week 01/11/18; the patient is down 13 pounds. He put his stage III lymphedema left leg in 4K compression last week and there is less edema fluid however we still haven't been able to communicate with home health but apparently it is [facility] but  the dressings have not been changed. The patient noted an odor last week. He is also had compression pumps ordered by [facility] this as a not completed the paperwork stage. 01/18/18; patient continues to lose weight. Stage III lymphedema in the left greater than right leg under for alert compression. The major wound is on the left lateral ankle area. He apparently has bilateral compression pumps being brought to his house, these were ordered by [facility] Notable for the fact today he had some blisters on the right anterior leg together with some skin nodules. This is no doubt secondary to severe lymphedema. 01/25/18; the patient has obtained his compression pumps and is using them per vein and vascular instructions 3 times a day for an hour. He also saw [name] of vein and vascular. He was felt to have venous insufficiency but did not suggest any intervention also improved edema. It was suggested that he have compression stockings 20-30 mm on a daily basis in addition to  compression pumps. The patient arrives in clinic today with a layer of unna under for layer compression. he seems to have some trouble with the degree of compression. He has open areas on the left lateral ankle area which is his major wound left upper medial calf and a [name], [name] ([id number]) superficial open area on the right anterior shin area which was blistered last week. He has skin changes on the right anterior calf which I think are no doubt secondary to lymphedema skin nodules etc. 02/01/18; the patient comes in telling us his nephrologist have to his torsemide. Unfortunately today he is put on 7 pounds by our scales. He has blisters all over the anterior and medial part of his right calf and a new open wound. He also has soupy green drainage coming out of the left lateral calf /ankle wound. 02/08/18; culture I did last week of the left lateral ankle wound grew both Pseudomonas and  Morganella. He is on Keflex from Dr. Ola [name] in the hospital. I will need to review these notes.in any case Keflex is not going to cover these 2 organisms. I'm probably going to added ciprofloxacin today for 1 week. A lot of drainage that looks purulent last week. He is not complaining of pain however he has managed to put on 10 pounds in 2 weeks by our scales in this clinic. He is going to see his nephrologist tomorrow 02/15/18; he completed the ciprofloxacin I gave him last week. Notable that he is up to 379 pounds today which is up 16 pounds from 2 weeks ago. He is complaining of orthopnea but doesn't have any chest pain. 02/22/18; he continues to have weight gain. R intake nurse reports again purulent green drainage coming out of the lateral wound on the lateral left calf. He has small open area on the right anterior leg. His torsemide was increased to 2 tablets a day I believe this is 40 mg last week in response to the call admitted to Dr. Keturah Barre office. He follows up with Dr. Candiss [name] and Dr. Delana Meyer tomorrow 03/01/18 his weight essentially stable today at 383 pounds. Drainage out of the left lateral wound on the lower left calf/ankle is a lot less. Culture last time grew Pseudomonas. I put him on cefdinir. He has been to Dr. Keturah Barre office no adjustments in his diuretics. Dr. Nicoletta Dress prescribed a wraparound stocking for the right leg.there is no open area on the right leg. He has a superficial area on the left medial calf, left posterior calf and in the large area on the left lateral however this looks better 03/15/18; weight is not up to 393 pounds. He saw his nephrologist yesterday Dr. Candiss [name] will increase the Demadex I'm hopeful this will help with the edema control. I'm using silver alginate to all his wounds. In particular the left lateral ankle looks better. Unfortunately he has new open areas on the right lateral calf that will include use of his compression stocking at least in the short  to medium term. He has new wounds o3 on the right lateral calf. One of these has some size however all numerous superficial 03/22/18; his weight is stabilized a bit. Just adjustment of his diuretics by his nephrologist. There is no doubt he has some degree of systemic fluid overload on top of severe left greater than right lymphedema. 2 weeks ago tried to transition him to stockings on the right leg however he developed re-breakdown of skin on the right leg and we had  to put him back in compression last week. He also uses external compression pumps and claims to be compliant Continued concern about his depression today 03/29/18; several ongoing issues with this patient; He no longer has home health coverage apparently secondary to a lapse in insurance. He is apparently transitioning from short for long-term disability. [facility] was changing his compression wraps on Monday and Friday. He has gained 10 pounds since last week Sees [name] tomorrow Saw his primary doctor last week about the depression. It sounds as though he declined pharmacologic management. He seems somewhat better today. We were concerned last week when he came. Somewhat better today He is using his compression pumps once a day at home, I have asked for twice a day if possible especially on the left leg Follows up with his nephrologist in mid-August. He is managing his diuretic for I think stage IV chronic renal failure Paradoxically his wounds actually look better 04/19/18; the patient has not been seen since I last saw him 3 weeks ago. He saw Dr. Delana Meyer of vascular surgery on 03/30/18 I believe he put him in a 20/30 stocking bilaterally with a wraparound extremitease stocking. He has not been putting anything specifically on the wound. More problematic than that he has not been wearing the stockings he is at home. He has been using his external compression pumps once per day according to him on a rare occasion twice On  a psychosocial level the patient is now on long-term disability and is applying for COBRA therefore he is between insurances. He has not been able to follow up with Dr. Candiss [name] who is his nephrologist as a result. As noted his weight is up to 407 pounds today. He promises me he'll follow-up with Dr. Candiss [name] 05/03/18; he hasn't been here in 2 weeks now. He apparently has been wearing a compression sock on the left leg. Massive increase in edema 3 large open wounds on the left anterior leg that were probably blisters. Significant deterioration in the left lateral ankle wound that we've been doing as his most problematic wound. He still does not have his insurance issues wrapped up. His weight is well over 400 pounds. [name], [name] ([id number]) Objective Constitutional Patient is hypertensive.. Pulse regular and within target range for patient.[name] Respirations regular, non-labored and within target range.. Temperature is normal and within the target range for the patient.[name] appears in no distress. Vitals Time Taken: 11:29 AM, Height: 76 in, Weight: 409.5 lbs, Source: Measured, BMI: 49.8, Temperature: 98.3 F, Pulse: 72 bpm, Respiratory Rate: 18 breaths/min, Blood Pressure: 195/85 mmHg. General Notes: wound exam; his large wound on the left lateral calf has expanded. Nonviable protruding subcutaneous tissue around the circumference. Debrided with a #5 curet. Hemostasis with silver nitrate and direct pressure. Wound surface also much less viable than when I saw this 2 weeks ago. Considerable increase in the edema in the left leg. I'm not really clear why he has been wrapping his leg and I think it was a stocking within the extremitease circumference.there is no evidence of cellulitis or DVT think this is simply inadequate compression. Integumentary (Hair, Skin) Wound #12 status is Open. Original cause of wound was Surgical Injury. The wound is located on the Left,Lateral Lower Leg. The wound measures 3.5cm  length x 6.5cm width x 0.2cm depth; 17.868cm^2 area and 3.574cm^3 volume. There is no tunneling or undermining noted. There is a large amount of serous drainage noted. The wound margin is distinct with the outline  attached to the wound base. There is small (1-33%) red, pink granulation within the wound bed. There is a small (1-33%) amount of necrotic tissue within the wound bed including Adherent Slough. The periwound skin appearance exhibited: Scarring. The periwound skin appearance did not exhibit: Callus, Crepitus, Excoriation, Induration, Rash, Dry/Scaly, Maceration, Atrophie Blanche, Cyanosis, Ecchymosis, Hemosiderin Staining, Mottled, Pallor, Rubor, Erythema. Wound #13 status is Open. Original cause of wound was Gradually Appeared. The wound is located on the Left,Distal,Medial Lower Leg. The wound measures 3.5cm length x 3.8cm width x 0.2cm depth; 10.446cm^2 area and 2.089cm^3 volume. There is Fat Layer (Subcutaneous Tissue) Exposed exposed. There is no tunneling or undermining noted. There is a large amount of serous drainage noted. The wound margin is distinct with the outline attached to the wound base. There is medium (34-66%) red, pink granulation within the wound bed. There is a medium (34-66%) amount of necrotic tissue within the wound bed including Adherent Slough. The periwound skin appearance exhibited: Excoriation, Erythema. The periwound skin appearance did not exhibit: Callus, Crepitus, Induration, Rash, Scarring, Dry/Scaly, Maceration, Atrophie Blanche, Cyanosis, Ecchymosis, Hemosiderin Staining, Mottled, Pallor, Rubor. The surrounding wound skin color is noted with erythema which is circumferential. Wound #14 status is Open. Original cause of wound was Gradually Appeared. The wound is located on the Right,Distal,Medial Lower Leg. The wound measures 1.5cm length x 1.2cm width x 0.1cm depth; 1.414cm^2 area and 0.141cm^3 volume. There is no tunneling or undermining noted. There is  a medium amount of serous drainage noted. The wound margin is distinct with the outline attached to the wound base. There is no granulation within the wound bed. There is a large (67-100%) amount of necrotic tissue within the wound bed including Eschar and Adherent Slough. The periwound skin appearance did not exhibit: Callus, Crepitus, Excoriation, Induration, Rash, Scarring, Dry/Scaly, Maceration, Atrophie Blanche, Cyanosis, Ecchymosis, Hemosiderin Staining, Mottled, Pallor, Rubor, Erythema. Periwound temperature was noted as No Abnormality. Wound #2 status is Open. Original cause of wound was Gradually Appeared. The wound is located on the Left,Lateral,Posterior Lower Leg. The wound measures 2.2cm length x 7.5cm width x 0.3cm depth; 12.959cm^2 area and 3.888cm^3 volume. Wound #9 status is Healed - Epithelialized. Original cause of wound was Gradually Appeared. The wound is located on the Right,Posterior Lower Leg. The wound measures 0cm length x 0cm width x 0cm depth; 0cm^2 area and 0cm^3 volume. [name], [name] ([id number]) Assessment Active Problems ICD-10 Non-pressure chronic ulcer of left calf with necrosis of muscle Lymphedema, not elsewhere classified Chronic venous hypertension (idiopathic) with ulcer and inflammation of left lower extremity Type 2 diabetes mellitus with other skin ulcer Procedures Wound #13 Pre-procedure diagnosis of Wound #13 is a Lymphedema located on the Left,Distal,Medial Lower Leg . There was a Excisional Skin/Subcutaneous Tissue Debridement with a total area of 13.3 sq cm performed by [name] Dillon, [name] With the following instrument(s): Curette to remove Viable and Non-Viable tissue/material. Material removed includes Subcutaneous Tissue and Slough and. No specimens were taken. A time out was conducted at 12:05, prior to the start of the procedure. A Moderate amount of bleeding was controlled with Silver Nitrate. The procedure was tolerated well. Patient  s Level of Consciousness post procedure was recorded as Awake and Alert. Post Debridement Measurements: 3.5cm length x 3.8cm width x 0.2cm depth; 2.089cm^3 volume. Character of Wound/Ulcer Post Debridement requires further debridement. Post procedure Diagnosis Wound #13: Same as Pre-Procedure Plan Wound Cleansing: Wound #12 Left,Lateral Lower Leg: Clean wound with Normal Saline. Cleanse wound with mild soap  and water Wound #13 Left,Distal,Medial Lower Leg: Clean wound with Normal Saline. Cleanse wound with mild soap and water Wound #14 Right,Distal,Medial Lower Leg: Clean wound with Normal Saline. Cleanse wound with mild soap and water Wound #2 Left,Lateral,Posterior Lower Leg: Clean wound with Normal Saline. Cleanse wound with mild soap and water Anesthetic (add to Medication List): Wound #12 Left,Lateral Lower Leg: Topical Lidocaine 4% cream applied to wound bed prior to debridement (In Clinic Only). Wound #13 Left,Distal,Medial Lower Leg: Topical Lidocaine 4% cream applied to wound bed prior to debridement (In Clinic Only). Wound #14 Right,Distal,Medial Lower Leg: Topical Lidocaine 4% cream applied to wound bed prior to debridement (In Clinic Only). Wound #2 Left,Lateral,Posterior Lower Leg: Topical Lidocaine 4% cream applied to wound bed prior to debridement (In Clinic Only). Primary Wound Dressing: Parran, Rolfe A. ([id number]) Wound #12 Left,Lateral Lower Leg: Silver Alginate Wound #13 Left,Distal,Medial Lower Leg: Silver Alginate Wound #14 Right,Distal,Medial Lower Leg: Silver Alginate Wound #2 Left,Lateral,Posterior Lower Leg: Silver Alginate Secondary Dressing: Wound #12 Left,Lateral Lower Leg: Other - xsorb Wound #13 Left,Distal,Medial Lower Leg: Other - xsorb Wound #14 Right,Distal,Medial Lower Leg: Other - xsorb Wound #2 Left,Lateral,Posterior Lower Leg: Other - xsorb Dressing Change Frequency: Wound #12 Left,Lateral Lower Leg: Change dressing every  week Wound #13 Left,Distal,Medial Lower Leg: Change dressing every week Wound #14 Right,Distal,Medial Lower Leg: Change dressing every week Wound #2 Left,Lateral,Posterior Lower Leg: Change dressing every week Follow-up Appointments: Wound #12 Left,Lateral Lower Leg: Return Appointment in 1 week. Nurse Visit as needed Wound #13 Left,Distal,Medial Lower Leg: Return Appointment in 1 week. Nurse Visit as needed Wound #14 Right,Distal,Medial Lower Leg: Return Appointment in 1 week. Nurse Visit as needed Wound #2 Left,Lateral,Posterior Lower Leg: Return Appointment in 1 week. Nurse Visit as needed Edema Control: Wound #12 Left,Lateral Lower Leg: 4-Layer Compression System - Left Lower Extremity. Wound #13 Left,Distal,Medial Lower Leg: 4-Layer Compression System - Left Lower Extremity. Wound #14 Right,Distal,Medial Lower Leg: 4-Layer Compression System - Left Lower Extremity. Wound #2 Left,Lateral,Posterior Lower Leg: 4-Layer Compression System - Left Lower Extremity. #1continue with silver alginate to all the wounds on the lower extremities #2 there are 3 new wounds on the left anterior lower leg which no doubt were blisters from uncontrolled edema. #3 I see no evidence of infection or DVT #4 he will need for their compression, continue with his external compression pumps Bley, Hiren A. ([id number]) #5 clear evidence of heart failure at the bedside however the patient states she has abdominal fullness. He needs to see nephrology Dr. Candiss [name] and he knows this Electronic Signature(s) Signed: 05/03/2018 4:51:46 PM By: [name] Entered By: [name] 12:38:43 Kincheloe, Chancellor A. ([id number]) -------------------------------------------------------------------------------- SuperBill Details Patient Name: Strausbaugh, Rajiv A. Date of Service: 05/03/2018 Medical Record Number: [id number] Patient Account Number: [id number] Date of Birth/Sex: May 29, 1971 (47 y.o. M) Treating  RN: [name] Primary Care Provider: Gaetano [name] Other Clinician: Referring Provider: Gaetano [name] Treating Provider/Extender: [name] in Treatment: 18 Diagnosis Coding ICD-10 Codes Code Description [phone] Non-pressure chronic ulcer of left calf with necrosis of muscle I89.0 Lymphedema, not elsewhere classified I87.332 Chronic venous hypertension (idiopathic) with ulcer and inflammation of left lower extremity E11.622 Type 2 diabetes mellitus with other skin ulcer Facility Procedures CPT4 Code: [id number] Description: 78588 - DEB SUBQ TISSUE 20 SQ CM/< ICD-10 Diagnosis Description L97.223 Non-pressure chronic ulcer of left calf with necrosis of mu Modifier: scle Quantity: 1 Physician Procedures CPT4 Code: 5027741 Description: 11042 - WC PHYS SUBQ TISS 20 SQ  CM ICD-10 Diagnosis Description L97.223 Non-pressure chronic ulcer of left calf with necrosis of mu Modifier: scle Quantity: 1 Electronic Signature(s) Signed: 05/03/2018 4:51:46 PM By: [name] Entered By: [name] 12:39:07

## 2018-05-08 NOTE — Telephone Encounter (Signed)
Paperwork given to [name] by Dr. Delana Meyer.

## 2018-05-10 ENCOUNTER — Encounter: Payer: BLUE CROSS/BLUE SHIELD | Admitting: Internal Medicine

## 2018-05-10 DIAGNOSIS — E11622 Type 2 diabetes mellitus with other skin ulcer: Secondary | ICD-10-CM | POA: Diagnosis not present

## 2018-05-12 NOTE — Progress Notes (Signed)
[name] ([id number]) Visit Report for 05/10/2018 HPI Details Patient Name: [name], [name] A. Date of Service: 05/10/2018 8:30 AM Medical Record Number: [id number] Patient Account Number: [id number] Date of Birth/Sex: 1971/04/04 (47 y.o. M) Treating RN: Roger Shelter Primary Care Provider: Gaetano Net Other Clinician: Referring Provider: Gaetano Net Treating Provider/Extender: Tito Dine in Treatment: 20 History of Present Illness HPI Description: 12/28/17; this is a now 47 year old man who is a type II diabetic. He was hospitalized from 10/01/17 through 10/19/17. He had an MSSA soft tissue and skin infection. 2 open areas on the left leg were identified he has a smaller area on the left medial calf superiorly just below the knee and a wound just above the left ankle on the posterior medial aspect. I think both of these were surgical IandD sites when he was in the hospital. He was discharged with a wound VAC at that point however this is since been taken off. He follows with Dr. Ola Spurr for the [facility] and he is still on chronic Keflex at 500 twice a day. At that time he was hospitalized his hemoglobin A1c was 15.1 however if I'm reading his endocrinologist notes correctly that is improved. He has been following with Dr. [name] at vein [facility] vascular and he has been applying calcium alginate and Unna boots. He has home health changing the dressing. They have also been attempting to get him external compression pumps although the patient is unaware whether they've been approved by insurance at this point. as mentioned he has a smaller clean wound on the right lateral calf just below the knee and he has a much larger area just above the left ankle medially and posteriorly. Our intake nurse reported greenish purulent looking drainage.the patient did have surgical material sent to pathology in February. This showed chronic abscess The patient also has lymphedema stage III in the  left greater than right lower extremities. He has a history of blisters with wounds but these of all were always healed. The patient thinks that the lymphedema may have been present since he was about 47 years old i.e. about 30 years. He does not have graded pressure stockings and has not worn stockings. He does not have a distant history of DVT PE or phlebitis. He has not been systemically unwell fever no chills. He states that his Lasix is recently been reduced. He tells me his kidney function is at "30%" and he has been followed by Dr. [name] of nephrology. At one point he was on Lasix 80 twice a day however that's been cut back and he is now on Lasix at 20 twice a day. The patient has a history of PAD listed in his records although he comes from Dr. [name] I don't think is felt to have significant PAD. ABIs in our clinic were noncompressible bilaterally. 01/04/18; patient has a large wound on the left lateral lower calf and a small wound on the left medial upper calf. He has been to see his nephrologist who changed him to Demadex 40 mg a day. I'm hopeful this will help with his systemic fluid overload. He also has stage III lymphedema. Really no change in the 2 wounds since last week 01/11/18; the patient is down 13 pounds. He put his stage III lymphedema left leg in 4K compression last week and there is less edema fluid however we still haven't been able to communicate with home health but apparently it is [facility] but the dressings have not been changed. The  patient noted an odor last week. He is also had compression pumps ordered by [facility] this as a not completed the paperwork stage. 01/18/18; patient continues to lose weight. Stage III lymphedema in the left greater than right leg under for alert compression. The major wound is on the left lateral ankle area. He apparently has bilateral compression pumps being brought to his house, these were ordered by [facility] Notable for the fact today he had some blisters on the right anterior leg together with some skin nodules. This is no doubt secondary to severe lymphedema. 01/25/18; the patient has obtained his compression pumps and is using them per vein and vascular instructions 3 times a day for an hour. He also saw [name] of vein and vascular. He was felt to have venous insufficiency but did not suggest any intervention also improved edema. It was suggested that he have compression stockings 20-30 mm on a daily basis in addition to compression pumps. [name], [name] ([id number]) The patient arrives in clinic today with a layer of unna under for layer compression. he seems to have some trouble with the degree of compression. He has open areas on the left lateral ankle area which is his major wound left upper medial calf and a superficial open area on the right anterior shin area which was blistered last week. He has skin changes on the right anterior calf which I think are no doubt secondary to lymphedema skin nodules etc. 02/01/18; the patient comes in telling us his nephrologist have to his torsemide. Unfortunately today he is put on 7 pounds by our scales. He has blisters all over the anterior and medial part of his right calf and a new open wound. He also has soupy green drainage coming out of the left lateral calf /ankle wound. 02/08/18; culture I did last week of the left lateral ankle wound grew both Pseudomonas and Morganella. He is on Keflex from Dr. Ola Spurr in the hospital. I will need to review these notes.in any case Keflex is not going to cover these 2 organisms. I'm probably going to added ciprofloxacin today for 1 week. A lot of drainage that looks purulent last week. He is not complaining of pain however he has managed to put on 10 pounds in 2 weeks by our scales in this clinic. He is going to see his nephrologist tomorrow 02/15/18; he completed the ciprofloxacin I gave him last week.  Notable that he is up to 379 pounds today which is up 16 pounds from 2 weeks ago. He is complaining of orthopnea but doesn't have any chest pain. 02/22/18; he continues to have weight gain. R intake nurse reports again purulent green drainage coming out of the lateral wound on the lateral left calf. He has small open area on the right anterior leg. His torsemide was increased to 2 tablets a day I believe this is 40 mg last week in response to the call admitted to Dr. Keturah Barre office. He follows up with Dr. [name] and Dr. Delana Meyer tomorrow 03/01/18 his weight essentially stable today at 383 pounds. Drainage out of the left lateral wound on the lower left calf/ankle is a lot less. Culture last time grew Pseudomonas. I put him on cefdinir. He has been to Dr. Keturah Barre office no adjustments in his diuretics. Dr. Nicoletta Dress prescribed a wraparound stocking for the right leg.there is no open area on the right leg. He has a superficial area on the left medial calf, left posterior  calf and in the large area on the left lateral however this looks better 03/15/18; weight is not up to 393 pounds. He saw his nephrologist yesterday Dr. [name] will increase the Demadex I'm hopeful this will help with the edema control. I'm using silver alginate to all his wounds. In particular the left lateral ankle looks better. oUnfortunately he has new open areas on the right lateral calf that will include use of his compression stocking at least in the short to medium term. He has new wounds o3 on the right lateral calf. One of these has some size however all numerous superficial 03/22/18; his weight is stabilized a bit. Just adjustment of his diuretics by his nephrologist. There is no doubt he has some degree of systemic fluid overload on top of severe left greater than right lymphedema. 2 weeks ago tried to transition him to stockings on the right leg however he developed re-breakdown of skin on the right leg and we had to put him back  in compression last week. He also uses external compression pumps and claims to be compliant Continued concern about his depression today 03/29/18; several ongoing issues with this patient; oHe no longer has home health coverage apparently secondary to a lapse in insurance. He is apparently transitioning from short for long-term disability. [facility] was changing his compression wraps on Monday and Friday. oHe has gained 10 pounds since last week oSees Dr. [name] tomorrow oSaw his primary doctor last week about the depression. It sounds as though he declined pharmacologic management. He seems somewhat better today. We were concerned last week when he came. Somewhat better today oHe is using his compression pumps once a day at home, I have asked for twice a day if possible especially on the left leg oFollows up with his nephrologist in mid-August. He is managing his diuretic for I think stage IV chronic renal failure oParadoxically his wounds actually look better 04/19/18; the patient has not been seen since I last saw him 3 weeks ago. He saw Dr. Delana Meyer of vascular surgery on 03/30/18 I believe he put him in a 20/30 stocking bilaterally with a wraparound extremitease stocking. He has not been putting anything specifically on the wound. More problematic than that he has not been wearing the stockings he is at home. He has been using his external compression pumps once per day according to him on a rare occasion twice On a psychosocial level the patient is now on long-term disability and is applying for COBRA therefore he is between insurances. He has not been able to follow up with Dr. [name] who is his nephrologist as a result. As noted his weight is up to 407 pounds today. He promises me he'll follow-up with Dr. [name] 05/03/18; he hasn't been here in 2 weeks now. He apparently has been wearing a compression sock on the left leg. Massive increase in edema 3 large open wounds on the left anterior  leg that were probably blisters. Significant deterioration in the left lateral ankle wound that we've been doing as his most problematic wound. He still does not have his insurance issues wrapped up. His weight is well over 400 pounds. 05/10/18; arrives today with better looking edema control in the left leg. He has been using his compression pumps twice a day. We also wrapped it is left leg for there he's been using his pumps so there is much better edema control. Most of new [name], [name] A. ([id number]) wounds from last week look a  lot better. Even the refractory area on the lower left lateral ankle looks a lot better to me today. He follows up with Dr. [name] this morning [nephrology] Electronic Signature(s) Signed: 05/10/2018 5:30:50 PM By: Linton Ham MD Entered By: Linton Ham on 05/10/2018 09:21:13 Abdella, Artha [name] Kitchen ([id number]) -------------------------------------------------------------------------------- Physical Exam Details Patient Name: [name], [name] A. Date of Service: 05/10/2018 8:30 AM Medical Record Number: [id number] Patient Account Number: [id number] Date of Birth/Sex: 1970-09-21 (47 y.o. M) Treating RN: Roger Shelter Primary Care Provider: Gaetano Net Other Clinician: Referring Provider: Gaetano Net Treating Provider/Extender: Tito Dine in Treatment: 57 Constitutional Patient is hypertensive.. Pulse regular and within target range for patient.[name] Respirations regular, non-labored and within target range.. Temperature is normal and within the target range for the patient.[name] appears in no distress. Eyes Conjunctivae clear. No discharge. Respiratory Respiratory effort is easy and symmetric bilaterally. Rate is normal at rest and on room air.. Bilateral breath sounds are clear and equal in all lobes with no wheezes, rales or rhonchi.. Cardiovascular JVP is clearly elevated. much better edema control in the left leg nonpitting no evidence of cellulitis  or a DVT. Integumentary (Hair, Skin) left greater than right lymphedema no other primary skin issues. Psychiatric No evidence of depression, anxiety, or agitation. Calm, cooperative, and communicative. Appropriate interactions and affect.. Notes wound exam; most of the blistered areas on the left anterior leg are a lot better. The major area on the left lateral lower calf/ankle is also considerably better after last week's debridement. No debridement is necessary. He has superficial areas remaining on the left anterior and left lateral leg which are remnants of blisters from last week. The amount of edema in the left leg is also considerably better Electronic Signature(s) Signed: 05/10/2018 5:30:50 PM By: Linton Ham MD Entered By: Linton Ham on 05/10/2018 09:23:32 Aguayo, [name] Kitchen ([id number]) -------------------------------------------------------------------------------- Physician Orders Details Patient Name: [name], [name] A. Date of Service: 05/10/2018 8:30 AM Medical Record Number: [id number] Patient Account Number: [id number] Date of Birth/Sex: 1971/02/09 (47 y.o. M) Treating RN: Roger Shelter Primary Care Provider: Gaetano Net Other Clinician: Referring Provider: Gaetano Net Treating Provider/Extender: Tito Dine in Treatment: 81 Verbal / Phone Orders: No Diagnosis Coding Wound Cleansing Wound #12 Left,Lateral Lower Leg o Clean wound with Normal Saline. o Cleanse wound with mild soap and water Wound #13 Left,Distal,Medial Lower Leg o Clean wound with Normal Saline. o Cleanse wound with mild soap and water Wound #2 Left,Lateral,Posterior Lower Leg o Clean wound with Normal Saline. o Cleanse wound with mild soap and water Anesthetic (add to Medication List) Wound #12 Left,Lateral Lower Leg o Topical Lidocaine 4% cream applied to wound bed prior to debridement (In Clinic Only). Wound #13 Left,Distal,Medial Lower Leg o Topical  Lidocaine 4% cream applied to wound bed prior to debridement (In Clinic Only). Wound #2 Left,Lateral,Posterior Lower Leg o Topical Lidocaine 4% cream applied to wound bed prior to debridement (In Clinic Only). Primary Wound Dressing Wound #12 Left,Lateral Lower Leg o Silver Alginate Wound #13 Left,Distal,Medial Lower Leg o Silver Alginate Wound #2 Left,Lateral,Posterior Lower Leg o Silver Alginate Secondary Dressing Wound #12 Left,Lateral Lower Leg o Other - xsorb Wound #13 Left,Distal,Medial Lower Leg o Other - xsorb Wound #2 Left,Lateral,Posterior Lower Leg o Other - xsorb Dressing Change Frequency Kronenberger, Selvin A. ([id number]) Wound #12 Left,Lateral Lower Leg o Change dressing every week Wound #13 Left,Distal,Medial Lower Leg o Change dressing every week Wound #2 Left,Lateral,Posterior Lower Leg o Change dressing every week Follow-up Appointments Wound #  12 Left,Lateral Lower Leg o Return Appointment in 1 week. o Nurse Visit as needed Wound #13 Left,Distal,Medial Lower Leg o Return Appointment in 1 week. o Nurse Visit as needed Wound #2 Left,Lateral,Posterior Lower Leg o Return Appointment in 1 week. o Nurse Visit as needed Edema Control Wound #12 Left,Lateral Lower Leg o 4-Layer Compression System - Left Lower Extremity. Wound #13 Left,Distal,Medial Lower Leg o 4-Layer Compression System - Left Lower Extremity. Wound #2 Left,Lateral,Posterior Lower Leg o 4-Layer Compression System - Left Lower Extremity. Electronic Signature(s) Signed: 05/10/2018 5:03:38 PM By: Roger Shelter Signed: 05/10/2018 5:30:50 PM By: Linton Ham MD Entered By: Roger Shelter on 05/10/2018 09:12:41 [name], [name] A. ([id number]) -------------------------------------------------------------------------------- Problem List Details Patient Name: [name], [name] A. Date of Service: 05/10/2018 8:30 AM Medical Record Number: [id number] Patient Account Number:  [id number] Date of Birth/Sex: 03-16-1971 (47 y.o. 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 ([id number]) -------------------------------------------------------------------------------- Progress Note Details Patient Name: Comer, [name] A. Date of Service: 05/10/2018 8:30 AM Medical Record Number: [id number] Patient Account Number: [id number] Date of Birth/Sex: [birth year] (47 y.o. M) Treating RN: Roger Shelter Primary Care Provider: Gaetano Net Other Clinician: Referring Provider: Gaetano Net Treating Provider/Extender: Tito Dine in Treatment: 19 Subjective History of Present Illness (HPI) 12/28/17; this is a now 47 year old man who is a type II diabetic. He was hospitalized from 10/01/17 through 10/19/17. He had an MSSA soft tissue and skin infection. 2 open areas on the left leg were identified he has a smaller area on the left medial calf superiorly just below the knee and a wound just above the left ankle on the posterior medial aspect. I think both of these were surgical IandD  sites when he was in the hospital. He was discharged with a wound VAC at that point however this is since been taken off. He follows with Dr. Ola Spurr for the [facility] and he is still on chronic Keflex at 500 twice a day. At that time he was hospitalized his hemoglobin A1c was 15.1 however if I'm reading his endocrinologist notes correctly that is improved. He has been following with Dr. [name] at vein and vascular and he has been applying calcium alginate and Unna boots. He has home health changing the dressing. They have also been attempting to get him external compression pumps although the patient is unaware whether they've been approved by insurance at this point. as mentioned he has a smaller clean wound on the right lateral calf just below the knee and he has a much larger area just above the left ankle medially and posteriorly. Our intake nurse reported greenish purulent looking drainage.the patient did have surgical material sent to pathology in February. This showed chronic abscess The patient also has lymphedema stage III in the left greater than right lower  extremities. He has a history of blisters with wounds but these of all were always healed. The patient thinks that the lymphedema may have been present since he was about 47 years old i.e. about 30 years. He does not have graded pressure stockings and has not worn stockings. He does not have a distant history of DVT PE or phlebitis. He has not been systemically unwell fever no chills. He states that his Lasix is recently been reduced. He tells me his kidney function is at "30%" and he has been followed by Dr. [name] of nephrology. At one point he was on Lasix 80 twice a day however that's been cut back and he is now on Lasix at 20 twice a day. The patient has a history of PAD listed in his records although he comes from Dr. [name] I don't think is felt to have significant PAD. ABIs in our clinic were noncompressible  bilaterally. 01/04/18; patient has a large wound on the left lateral lower calf and a small wound on the left medial upper calf. He has been to see his nephrologist who changed him to Demadex 40 mg a day. I'm hopeful this will help with his systemic fluid overload. He also has stage III lymphedema. Really no change in the 2 wounds since last week 01/11/18; the patient is down 13 pounds. He put his stage III lymphedema left leg in 4K compression last week and there is less edema fluid however we still haven't been able to communicate with home health but apparently [facility] is [facility] but the dressings have not been changed. The patient noted an odor last week. He is also had compression pumps ordered by [facility] this as a not completed the paperwork stage. 01/18/18; patient continues to lose weight. Stage III lymphedema in the left greater than right leg under for alert compression. The major wound is on the left lateral ankle area. He apparently has bilateral compression pumps being brought to his house, these were ordered by [facility] Notable for the fact today he had some blisters on the right anterior leg together with some skin nodules. This is no doubt secondary to severe lymphedema. 01/25/18; the patient has obtained his compression pumps and is using them per vein and vascular instructions 3 times a day for an hour. He also saw [name] of vein and vascular. He was felt to have venous insufficiency but did not suggest any intervention also improved edema. It was suggested that he have compression stockings 20-30 mm on a daily basis in addition to compression pumps. The patient arrives in clinic today with a layer of unna under for layer compression. he seems to have some trouble with the degree of compression. He has open areas on the left lateral ankle area which is his major wound left upper medial calf and a [name], [name] A. ([id number]) superficial open area on  the right anterior shin area which was blistered last week. He has skin changes on the right anterior calf which I think are no doubt secondary to lymphedema skin nodules etc. 02/01/18; the patient comes in telling us his nephrologist have to his torsemide. Unfortunately today he is put on 7 pounds by our scales. He has blisters all over the anterior and medial part of his right calf and a new open wound. He also has soupy green drainage coming out of the left lateral calf /ankle wound. 02/08/18; culture I did last week of the left lateral ankle wound grew  both Pseudomonas and Morganella. He is on Keflex from Dr. Ola Spurr in the hospital. I will need to review these notes.in any case Keflex is not going to cover these 2 organisms. I'm probably going to added ciprofloxacin today for 1 week. A lot of drainage that looks purulent last week. He is not complaining of pain however he has managed to put on 10 pounds in 2 weeks by our scales in this clinic. He is going to see his nephrologist tomorrow 02/15/18; he completed the ciprofloxacin I gave him last week. Notable that he is up to 379 pounds today which is up 16 pounds from 2 weeks ago. He is complaining of orthopnea but doesn't have any chest pain. 02/22/18; he continues to have weight gain. R intake nurse reports again purulent green drainage coming out of the lateral wound on the lateral left calf. He has small open area on the right anterior leg. His torsemide was increased to 2 tablets a day I believe this is 40 mg last week in response to the call admitted to Dr. Keturah Barre office. He follows up with Dr. [name] and Dr. Delana Meyer tomorrow 03/01/18 his weight essentially stable today at 383 pounds. Drainage out of the left lateral wound on the lower left calf/ankle is a lot less. Culture last time grew Pseudomonas. I put him on cefdinir. He has been to Dr. Keturah Barre office no adjustments in his diuretics. Dr. Nicoletta Dress prescribed a wraparound stocking for the  right leg.there is no open area on the right leg. He has a superficial area on the left medial calf, left posterior calf and in the large area on the left lateral however this looks better 03/15/18; weight is not up to 393 pounds. He saw his nephrologist yesterday Dr. [name] will increase the Demadex I'm hopeful this will help with the edema control. I'm using silver alginate to all his wounds. In particular the left lateral ankle looks better. Unfortunately he has new open areas on the right lateral calf that will include use of his compression stocking at least in the short to medium term. He has new wounds o3 on the right lateral calf. One of these has some size however all numerous superficial 03/22/18; his weight is stabilized a bit. Just adjustment of his diuretics by his nephrologist. There is no doubt he has some degree of systemic fluid overload on top of severe left greater than right lymphedema. 2 weeks ago tried to transition him to stockings on the right leg however he developed re-breakdown of skin on the right leg and we had to put him back in compression last week. He also uses external compression pumps and claims to be compliant Continued concern about his depression today 03/29/18; several ongoing issues with this patient; He no longer has home health coverage apparently secondary to a lapse in insurance. He is apparently transitioning from short for long-term disability. [facility] was changing his compression wraps on Monday and Friday. He has gained 10 pounds since last week Sees Dr. [name] tomorrow Saw his primary doctor last week about the depression. It sounds as though he declined pharmacologic management. He seems somewhat better today. We were concerned last week when he came. Somewhat better today He is using his compression pumps once a day at home, I have asked for twice a day if possible especially on the left leg Follows up with his nephrologist in mid-August.  He is managing his diuretic for I think stage IV chronic renal failure Paradoxically his  wounds actually look better 04/19/18; the patient has not been seen since I last saw him 3 weeks ago. He saw Dr. Delana Meyer of vascular surgery on 03/30/18 I believe he put him in a 20/30 stocking bilaterally with a wraparound extremitease stocking. He has not been putting anything specifically on the wound. More problematic than that he has not been wearing the stockings he is at home. He has been using his external compression pumps once per day according to him on a rare occasion twice On a psychosocial level the patient is now on long-term disability and is applying for COBRA therefore he is between insurances. He has not been able to follow up with Dr. [name] who is his nephrologist as a result. As noted his weight is up to 407 pounds today. He promises me he'll follow-up with Dr. [name] 05/03/18; he hasn't been here in 2 weeks now. He apparently has been wearing a compression sock on the left leg. Massive increase in edema 3 large open wounds on the left anterior leg that were probably blisters. Significant deterioration in the left lateral ankle wound that we've been doing as his most problematic wound. He still does not have his insurance issues wrapped up. His weight is well over 400 pounds. 05/10/18; arrives today with better looking edema control in the left leg. He has been using his compression pumps twice a day. We also wrapped it is left leg for there he's been using his pumps so there is much better edema control. Most of new wounds from last week look a lot better. Even the refractory area on the lower left lateral ankle looks a lot better to me today. He follows up with Dr. [name] this morning [nephrology] [name], [name] A. ([id number]) Objective Constitutional Patient is hypertensive.. Pulse regular and within target range for patient.[name] Respirations regular, non-labored and within target range..  Temperature is normal and within the target range for the patient.[name] appears in no distress. Vitals Time Taken: 8:24 AM, Height: 76 in, Weight: 406 lbs, BMI: 49.4, Temperature: 98.3 F, Pulse: 64 bpm, Respiratory Rate: 18 breaths/min, Blood Pressure: 160/75 mmHg. Eyes Conjunctivae clear. No discharge. Respiratory Respiratory effort is easy and symmetric bilaterally. Rate is normal at rest and on room air.. Bilateral breath sounds are clear and equal in all lobes with no wheezes, rales or rhonchi.. Cardiovascular JVP is clearly elevated. much better edema control in the left leg nonpitting no evidence of cellulitis or a DVT. Psychiatric No evidence of depression, anxiety, or agitation. Calm, cooperative, and communicative. Appropriate interactions and affect.. General Notes: wound exam; most of the blistered areas on the left anterior leg are a lot better. The major area on the left lateral lower calf/ankle is also considerably better after last week's debridement. No debridement is necessary. He has superficial areas remaining on the left anterior and left lateral leg which are remnants of blisters from last week. The amount of edema in the left leg is also considerably better Integumentary (Hair, Skin) left greater than right lymphedema no other primary skin issues. Wound #12 status is Open. Original cause of wound was Surgical Injury. The wound is located on the Left,Lateral Lower Leg. The wound measures 1.6cm length x 1.5cm width x 0.1cm depth; 1.885cm^2 area and 0.188cm^3 volume. There is no tunneling or undermining noted. There is a large amount of serous drainage noted. The wound margin is distinct with the outline attached to the wound base. There is small (1-33%) red, pink granulation within the wound bed.  There is a small (1-33%) amount of necrotic tissue within the wound bed including Adherent Slough. The periwound skin appearance exhibited: Scarring. The periwound skin appearance  did not exhibit: Callus, Crepitus, Excoriation, Induration, Rash, Dry/Scaly, Maceration, Atrophie Blanche, Cyanosis, Ecchymosis, Hemosiderin Staining, Mottled, Pallor, Rubor, Erythema. Wound #13 status is Open. Original cause of wound was Gradually Appeared. The wound is located on the Left,Distal,Medial Lower Leg. The wound measures 0.5cm length x 1.5cm width x 0.1cm depth; 0.589cm^2 area and 0.059cm^3 volume. There is Fat Layer (Subcutaneous Tissue) Exposed exposed. There is no tunneling or undermining noted. There is a large amount of serous drainage noted. The wound margin is distinct with the outline attached to the wound base. There is medium (34- 66%) red, pink granulation within the wound bed. There is a medium (34-66%) amount of necrotic tissue within the wound bed including Adherent Slough. The periwound skin appearance exhibited: Excoriation, Erythema. The periwound skin appearance did not exhibit: Callus, Crepitus, Induration, Rash, Scarring, Dry/Scaly, Maceration, Atrophie Blanche, Cyanosis, Ecchymosis, Hemosiderin Staining, Mottled, Pallor, Rubor. The surrounding wound skin color is noted with erythema which is circumferential. Wound #14 status is Healed - Epithelialized. Original cause of wound was Gradually Appeared. The wound is located on the Gladden, Murphy A. ([id number]) Right,Distal,Medial Lower Leg. The wound measures 0cm length x 0cm width x 0cm depth; 0cm^2 area and 0cm^3 volume. Wound #2 status is Open. Original cause of wound was Gradually Appeared. The wound is located on the Left,Lateral,Posterior Lower Leg. The wound measures 2.2cm length x 6.8cm width x 0.3cm depth; 11.75cm^2 area and 3.525cm^3 volume. There is Fat Layer (Subcutaneous Tissue) Exposed exposed. There is no tunneling or undermining noted. There is a medium amount of serous drainage noted. The wound margin is flat and intact. There is medium (34-66%) red, hyper - granulation within the wound bed. There is a  medium (34-66%) amount of necrotic tissue within the wound bed including Adherent Slough. The periwound skin appearance exhibited: Hemosiderin Staining. The periwound skin appearance did not exhibit: Callus, Crepitus, Excoriation, Induration, Rash, Scarring, Dry/Scaly, Maceration, Atrophie Blanche, Cyanosis, Ecchymosis, Mottled, Pallor, Rubor, Erythema. Assessment Active Problems ICD-10 Non-pressure chronic ulcer of left calf with necrosis of muscle Lymphedema, not elsewhere classified Chronic venous hypertension (idiopathic) with ulcer and inflammation of left lower extremity Type 2 diabetes mellitus with other skin ulcer Plan Wound Cleansing: Wound #12 Left,Lateral Lower Leg: Clean wound with Normal Saline. Cleanse wound with mild soap and water Wound #13 Left,Distal,Medial Lower Leg: Clean wound with Normal Saline. Cleanse wound with mild soap and water Wound #2 Left,Lateral,Posterior Lower Leg: Clean wound with Normal Saline. Cleanse wound with mild soap and water Anesthetic (add to Medication List): Wound #12 Left,Lateral Lower Leg: Topical Lidocaine 4% cream applied to wound bed prior to debridement (In Clinic Only). Wound #13 Left,Distal,Medial Lower Leg: Topical Lidocaine 4% cream applied to wound bed prior to debridement (In Clinic Only). Wound #2 Left,Lateral,Posterior Lower Leg: Topical Lidocaine 4% cream applied to wound bed prior to debridement (In Clinic Only). Primary Wound Dressing: Wound #12 Left,Lateral Lower Leg: Silver Alginate Wound #13 Left,Distal,Medial Lower Leg: Silver Alginate Wound #2 Left,Lateral,Posterior Lower Leg: Silver Alginate Secondary Dressing: Wound #12 Left,Lateral Lower Leg: Other - xsorb [name], [name] A. ([id number]) Wound #13 Left,Distal,Medial Lower Leg: Other - xsorb Wound #2 Left,Lateral,Posterior Lower Leg: Other - xsorb Dressing Change Frequency: Wound #12 Left,Lateral Lower Leg: Change dressing every week Wound #13  Left,Distal,Medial Lower Leg: Change dressing every week Wound #2 Left,Lateral,Posterior Lower Leg: Change dressing every week  Follow-up Appointments: Wound #12 Left,Lateral Lower Leg: Return Appointment in 1 week. Nurse Visit as needed Wound #13 Left,Distal,Medial Lower Leg: Return Appointment in 1 week. Nurse Visit as needed Wound #2 Left,Lateral,Posterior Lower Leg: Return Appointment in 1 week. Nurse Visit as needed Edema Control: Wound #12 Left,Lateral Lower Leg: 4-Layer Compression System - Left Lower Extremity. Wound #13 Left,Distal,Medial Lower Leg: 4-Layer Compression System - Left Lower Extremity. Wound #2 Left,Lateral,Posterior Lower Leg: 4-Layer Compression System - Left Lower Extremity. #1 continuous silver alginate/xtrasorb/4-layer compression #2 I'm going to continue to put him in compression for as long as it takes the wounds on the left leg to heal #3 he has a compression stocking with the juxta like that was provided to him by Dr. [name] however this was not sufficient to maintain edema control in the left leg at least not when we tried it. Electronic Signature(s) Signed: 05/10/2018 5:30:50 PM By: Linton Ham MD Entered By: Linton Ham on 05/10/2018 09:26:14 [name], [name] Kitchen ([id number]) -------------------------------------------------------------------------------- SuperBill Details Patient Name: [name], [name] A. Date of Service: 05/10/2018 Medical Record Number: [id number] Patient Account Number: [id number] Date of Birth/Sex: [birth year] (47 y.o. M) Treating RN: Roger Shelter Primary Care Provider: Gaetano Net Other Clinician: Referring Provider: Gaetano Net Treating Provider/Extender: Tito Dine in Treatment: 19 Diagnosis Coding ICD-10 Codes Code Description [phone] Non-pressure chronic ulcer of left calf with necrosis of muscle I89.0 Lymphedema, not elsewhere classified I87.332 Chronic venous hypertension (idiopathic) with ulcer  and inflammation of left lower extremity E11.622 Type 2 diabetes mellitus with other skin ulcer Facility Procedures CPT4 Code: [id number] Description: 35701 - WOUND CARE VISIT-LEV 5 EST PT Modifier: Quantity: 1 Physician Procedures CPT4: Description Modifier Quantity Code [id number] - WC PHYS LEVEL 3 - EST PT 1 ICD-10 Diagnosis Description L97.223 Non-pressure chronic ulcer of left calf with necrosis of muscle I89.0 Lymphedema, not elsewhere classified I87.332 Chronic venous  hypertension (idiopathic) with ulcer and inflammation of left lower extremity Electronic Signature(s) Signed: 05/10/2018 5:30:50 PM By: Linton Ham MD Entered By: Linton Ham on 05/10/2018 09:26:38

## 2018-05-12 NOTE — Progress Notes (Signed)
[name] ([id number]) Visit Report for 05/10/2018 Arrival Information Details Patient Name: [name], [name] Date of Service: 05/10/2018 8:30 AM Medical Record Number: [id number] Patient Account Number: [id number] Date of Birth/Sex: 1971-01-23 (47 y.o. M) Treating RN: [name] Primary Care [name]: [name] Other Clinician: Referring [name]: [name] Treating [name]/Extender: [name] in Treatment: 46 Visit Information History Since Last Visit Added or deleted any medications: No Patient Arrived: Ambulatory Any new allergies or adverse reactions: No Arrival Time: 08:22 Had a fall or experienced change in No Accompanied By: self activities of daily living that may affect Transfer Assistance: None risk of falls: Patient Identification Verified: Yes Signs or symptoms of abuse/neglect since last visito No Secondary Verification Process Yes Hospitalized since last visit: No Completed: Has Dressing in Place as Prescribed: Yes Patient Requires Transmission-Based No Pain Present Now: No Precautions: Patient Has Alerts: Yes Patient Alerts: DM II noncompressible bilateral Electronic Signature(s) Signed: 05/10/2018 1:16:02 PM By: [name] [name] Entered By: [name] on 05/10/2018 08:23:59 [name], [name] ([id number]) -------------------------------------------------------------------------------- Clinic Level of Care Assessment Details Patient Name: [name], [name] Date of Service: 05/10/2018 8:30 AM Medical Record Number: [id number] Patient Account Number: [id number] Date of Birth/Sex: 05-Sep-1970 (47 y.o. M) Treating RN: [name] Primary Care [name]: [name] Other Clinician: Referring [name]: [name] Treating [name]/Extender: [name] in Treatment: 19 Clinic Level of Care Assessment Items TOOL 4 Quantity Score X - Use when only an EandM is performed on  FOLLOW-UP visit 1 0 ASSESSMENTS - Nursing Assessment / Reassessment X - Reassessment of Co-morbidities (includes updates in patient status) 1 10 X- 1 5 Reassessment of Adherence to Treatment Plan ASSESSMENTS - Wound and Skin Assessment / Reassessment []  - Simple Wound Assessment / Reassessment - one wound 0 X- 4 5 Complex Wound Assessment / Reassessment - multiple wounds []  - 0 Dermatologic / Skin Assessment (not related to wound area) ASSESSMENTS - Focused Assessment []  - Circumferential Edema Measurements - multi extremities 0 []  - 0 Nutritional Assessment / Counseling / Intervention []  - 0 Lower Extremity Assessment (monofilament, tuning fork, pulses) []  - 0 Peripheral Arterial Disease Assessment (using hand held doppler) ASSESSMENTS - Ostomy and/or Continence Assessment and Care []  - Incontinence Assessment and Management 0 []  - 0 Ostomy Care Assessment and Management (repouching, etc.) PROCESS - Coordination of Care []  - Simple Patient / Family Education for ongoing care 0 X- 1 20 Complex (extensive) Patient / Family Education for ongoing care []  - 0 Staff obtains Programmer, systems, Records, Test Results / Process Orders []  - 0 Staff telephones HHA, Nursing Homes / Clarify orders / etc []  - 0 Routine Transfer to another Facility (non-emergent condition) []  - 0 Routine Hospital Admission (non-emergent condition) []  - 0 New Admissions / Biomedical engineer / Ordering NPWT, Apligraf, etc. []  - 0 Emergency Hospital Admission (emergent condition) []  - 0 Simple Discharge Coordination [name], [name] ([id number]) X- 1 15 Complex (extensive) Discharge Coordination PROCESS - Special Needs []  - Pediatric / Minor Patient Management 0 []  - 0 Isolation Patient Management []  - 0 Hearing / Language / Visual special needs []  - 0 Assessment of Community assistance (transportation, D/C planning, etc.) []  - 0 Additional assistance / Altered mentation []  - 0 Support Surface(s)  Assessment (bed, cushion, seat, etc.) INTERVENTIONS - Wound Cleansing / Measurement []  - Simple Wound Cleansing - one wound 0 X- 4 5 Complex Wound Cleansing - multiple wounds X- 1 5 Wound Imaging (photographs -  any number of wounds) []  - 0 Wound Tracing (instead of photographs) []  - 0 Simple Wound Measurement - one wound X- 4 5 Complex Wound Measurement - multiple wounds INTERVENTIONS - Wound Dressings X - Small Wound Dressing one or multiple wounds 4 10 []  - 0 Medium Wound Dressing one or multiple wounds []  - 0 Large Wound Dressing one or multiple wounds []  - 0 Application of Medications - topical []  - 0 Application of Medications - injection INTERVENTIONS - Miscellaneous []  - External ear exam 0 []  - 0 Specimen Collection (cultures, biopsies, blood, body fluids, etc.) []  - 0 Specimen(s) / Culture(s) sent or taken to Lab for analysis []  - 0 Patient Transfer (multiple staff / Civil Service fast streamer / Similar devices) []  - 0 Simple Staple / Suture removal (25 or less) []  - 0 Complex Staple / Suture removal (26 or more) []  - 0 Hypo / Hyperglycemic Management (close monitor of Blood Glucose) []  - 0 Ankle / Brachial Index (ABI) - do not check if billed separately X- 1 5 Vital Signs [name], [name] ([id number]) Has the patient been seen at the hospital within the last three years: Yes Total Score: 160 Level Of Care: New/Established - Level 5 Electronic Signature(s) Signed: 05/10/2018 5:03:38 PM By: [name] Entered By: [name] on 05/10/2018 09:13:46 [name], [name] ([id number]) -------------------------------------------------------------------------------- Encounter Discharge Information Details Patient Name: [name], [name] Date of Service: 05/10/2018 8:30 AM Medical Record Number: [id number] Patient Account Number: [id number] Date of Birth/Sex: [birth year] (47 y.o. M) Treating RN: [name] Primary Care [name]: [name] Other Clinician: Referring  [name]: [name] Treating [name]/Extender: [name] in Treatment: 65 Encounter Discharge Information Items Discharge Condition: Stable Ambulatory Status: Ambulatory Discharge Destination: Home Transportation: Private Auto Accompanied By: self Schedule Follow-up Appointment: Yes Clinical Summary of Care: Electronic Signature(s) Signed: 05/10/2018 9:41:33 AM By: [name] Entered By: [name] on 05/10/2018 09:41:33 [name], [name] ([id number]) -------------------------------------------------------------------------------- Lower Extremity Assessment Details Patient Name: Pyon, Dontrez A. Date of Service: 05/10/2018 8:30 AM Medical Record Number: [id number] Patient Account Number: [id number] Date of Birth/Sex: 06/30/1971 (47 y.o. M) Treating RN: [name] Primary Care Lamont Glasscock: [name] Other Clinician: Referring Jazelle Achey: [name] Treating Natalia Wittmeyer/Extender: [name] in Treatment: 19 Edema Assessment Assessed: [Left: No] [Right: No] [Left: Edema] [Right: :] Calf Left: Right: Point of Measurement: 36 cm From Medial Instep 64 cm cm Ankle Left: Right: Point of Measurement: 14 cm From Medial Instep 51.3 cm cm Vascular Assessment Pulses: Dorsalis Pedis Palpable: [Left:Yes] Posterior Tibial Extremity colors, hair growth, and conditions: Extremity Color: [Left:Hyperpigmented] Hair Growth on Extremity: [Left:Yes] Temperature of Extremity: [Left:Warm] Capillary Refill: [Left:< 3 seconds] Toe Nail Assessment Left: Right: Thick: Yes Discolored: Yes Deformed: No Improper Length and Hygiene: Yes Electronic Signature(s) Signed: 05/10/2018 5:20:22 PM By: [name] Entered By: [name] on 05/10/2018 08:53:03 [name], [name] ([id number]) -------------------------------------------------------------------------------- Multi Wound Chart Details Patient Name: Butrum, Erwin A. Date of Service: 05/10/2018 8:30  AM Medical Record Number: [id number] Patient Account Number: [id number] Date of Birth/Sex: 1971/04/15 (47 y.o. M) Treating RN: [name] Primary Care Nicolas Sisler: [name] Other Clinician: Referring Charlisa Cham: [name] Treating Thoren Hosang/Extender: [name] in Treatment: 19 Vital Signs Height(in): 76 Pulse(bpm): 63 Weight(lbs): 406 Blood Pressure(mmHg): 160/75 Body Mass Index(BMI): 49 Temperature(F): 98.3 Respiratory Rate 18 (breaths/min): Photos: Wound Location: Left Lower Leg - Lateral Left Lower Leg - Medial, Distal Right, Distal, Medial Lower Leg Wounding Event: Surgical Injury Gradually Appeared Gradually Appeared Primary Etiology:  Cellulitis Lymphedema Diabetic Wound/Ulcer of the Lower Extremity Secondary Etiology: N/A N/A Lymphedema Comorbid History: Hypertension, Type II Hypertension, Type II N/A Diabetes, Neuropathy Diabetes, Neuropathy Date Acquired: 09/18/2017 04/25/2018 04/24/2018 Weeks of Treatment: 3 1 1  Wound Status: Open Open Healed - Epithelialized Clustered Wound: Yes No No Clustered Quantity: 3 N/A N/A Measurements L x W x D 1.6x1.5x0.1 0.5x1.5x0.1 0x0x0 (cm) Area (cm) : 1.885 0.589 0 Volume (cm) : 0.188 0.059 0 % Reduction in Area: 88.20% 94.40% 100.00% % Reduction in Volume: 96.10% 97.20% 100.00% Classification: Partial Thickness Full Thickness Without Grade 2 Exposed Support Structures Exudate Amount: Large Large N/A Exudate Type: Serous Serous N/A Exudate Color: amber amber N/A Wound Margin: Distinct, outline attached Distinct, outline attached N/A Granulation Amount: Small (1-33%) Medium (34-66%) N/A Granulation Quality: Red, Pink Red, Pink N/A Necrotic Amount: Small (1-33%) Medium (34-66%) N/A Exposed Structures: N/A Soltau, Neiman A. ([id number]) Fascia: No Fat Layer (Subcutaneous Fat Layer (Subcutaneous Tissue) Exposed: Yes Tissue) Exposed: No Fascia: No Tendon: No Tendon: No Muscle: No Muscle: No Joint:  No Joint: No Bone: No Bone: No Epithelialization: Large (67-100%) Large (67-100%) N/A Periwound Skin Texture: Scarring: Yes Excoriation: Yes No Abnormalities Noted Excoriation: No Induration: No Induration: No Callus: No Callus: No Crepitus: No Crepitus: No Rash: No Rash: No Scarring: No Periwound Skin Moisture: Maceration: No Maceration: No No Abnormalities Noted Dry/Scaly: No Dry/Scaly: No Periwound Skin Color: Atrophie Blanche: No Erythema: Yes No Abnormalities Noted Cyanosis: No Atrophie Blanche: No Ecchymosis: No Cyanosis: No Erythema: No Ecchymosis: No Hemosiderin Staining: No Hemosiderin Staining: No Mottled: No Mottled: No Pallor: No Pallor: No Rubor: No Rubor: No Erythema Location: N/A Circumferential N/A Tenderness on Palpation: No No No Wound Preparation: Ulcer Cleansing: Ulcer Cleansing: N/A Rinsed/Irrigated with Saline Rinsed/Irrigated with Saline Topical Anesthetic Applied: Topical Anesthetic Applied: Other: lidocaine 4% Other: lidocaine 4% Wound Number: 2 N/A N/A Photos: N/A N/A Wound Location: Left Lower Leg - Lateral, N/A N/A Posterior Wounding Event: Gradually Appeared N/A N/A Primary Etiology: Diabetic Wound/Ulcer of the N/A N/A Lower Extremity Secondary Etiology: Lymphedema N/A N/A Comorbid History: Hypertension, Type II N/A N/A Diabetes, Neuropathy Date Acquired: 09/30/2017 N/A N/A Weeks of Treatment: 19 N/A N/A Wound Status: Open N/A N/A Clustered Wound: No N/A N/A Clustered Quantity: N/A N/A N/A Measurements L x W x D 2.2x6.8x0.3 N/A N/A (cm) Area (cm) : 11.75 N/A N/A Volume (cm) : 3.525 N/A N/A [name], [name] ([id number]) % Reduction in Area: 58.40% N/A N/A % Reduction in Volume: 68.80% N/A N/A Classification: Grade 2 N/A N/A Exudate Amount: Medium N/A N/A Exudate Type: Serous N/A N/A Exudate Color: amber N/A N/A Wound Margin: Flat and Intact N/A N/A Granulation Amount: Medium (34-66%) N/A N/A Granulation Quality:  Red, Hyper-granulation N/A N/A Necrotic Amount: Medium (34-66%) N/A N/A Exposed Structures: Fat Layer (Subcutaneous N/A N/A Tissue) Exposed: Yes Fascia: No Tendon: No Muscle: No Joint: No Bone: No Epithelialization: None N/A N/A Periwound Skin Texture: Excoriation: No N/A N/A Induration: No Callus: No Crepitus: No Rash: No Scarring: No Periwound Skin Moisture: Maceration: No N/A N/A Dry/Scaly: No Periwound Skin Color: Hemosiderin Staining: Yes N/A N/A Atrophie Blanche: No Cyanosis: No Ecchymosis: No Erythema: No Mottled: No Pallor: No Rubor: No Erythema Location: N/A N/A N/A Tenderness on Palpation: No N/A N/A Wound Preparation: Ulcer Cleansing: Other: soap N/A N/A and water Topical Anesthetic Applied: Other: lidocaine 4% Treatment Notes Electronic Signature(s) Signed: 05/10/2018 5:30:50 PM By: Linton Ham MD Entered By: Linton Ham on 05/10/2018 09:16:41 [name], [name] ([id number]) -------------------------------------------------------------------------------- Multi-Disciplinary Care  Plan Details Patient Name: [name], [name] Date of Service: 05/10/2018 8:30 AM Medical Record Number: [id number] Patient Account Number: [id number] Date of Birth/Sex: 07/26/1971 (47 y.o. M) Treating RN: [name] Primary Care Adylene Dlugosz: [name] Other Clinician: Referring Yuka Lallier: [name] Treating Nickolai Rinks/Extender: [name] in Treatment: 65 Active Inactive ` Abuse / Safety / Falls / Self Care Management Nursing Diagnoses: Potential for falls Goals: Patient will remain injury free related to falls Date Initiated: 12/28/2017 Target Resolution Date: 04/08/2018 Goal Status: Active Interventions: Assess fall risk on admission and as needed Notes: ` Nutrition Nursing Diagnoses: Impaired glucose control: actual or potential Goals: Patient/caregiver verbalizes understanding of need to maintain therapeutic glucose control per primary care  physician Date Initiated: 12/28/2017 Target Resolution Date: 04/08/2018 Goal Status: Active Interventions: Provide education on elevated blood sugars and impact on wound healing Notes: ` Orientation to the Wound Care Program Nursing Diagnoses: Knowledge deficit related to the wound healing center program Goals: Patient/caregiver will verbalize understanding of the [facility] Program Date Initiated: 12/28/2017 Target Resolution Date: 04/08/2018 Goal Status: Active Interventions: Donnelly, Ilhan A. ([id number]) Provide education on orientation to the wound center Notes: ` Venous Leg Ulcer Nursing Diagnoses: Knowledge deficit related to disease process and management Goals: Patient will maintain optimal edema control Date Initiated: 03/27/2018 Target Resolution Date: 04/27/2018 Goal Status: Active Interventions: Assess peripheral edema status every visit. Compression as ordered Treatment Activities: Therapeutic compression applied : 03/22/2018 Notes: ` Wound/Skin Impairment Nursing Diagnoses: Impaired tissue integrity Goals: Ulcer/skin breakdown will heal within 14 weeks Date Initiated: 12/28/2017 Target Resolution Date: 04/08/2018 Goal Status: Active Interventions: Assess patient/caregiver ability to obtain necessary supplies Assess patient/caregiver ability to perform ulcer/skin care regimen upon admission and as needed Assess ulceration(s) every visit Notes: Electronic Signature(s) Signed: 05/10/2018 5:03:38 PM By: [name] Entered By: [name] on 05/10/2018 09:08:58 [name], [name] ([id number]) -------------------------------------------------------------------------------- Pain Assessment Details Patient Name: [name], [name] Date of Service: 05/10/2018 8:30 AM Medical Record Number: [id number] Patient Account Number: [id number] Date of Birth/Sex: May 18, 1971 (47 y.o. M) Treating RN: [name] Primary Care Christino Mcglinchey: [name] Other  Clinician: Referring Ramiah Helfrich: [name] Treating Macintyre Alexa/Extender: [name] in Treatment: 22 Active Problems Location of Pain Severity and Description of Pain Patient Has Paino No Site Locations Pain Management and Medication Current Pain Management: Electronic Signature(s) Signed: 05/10/2018 1:16:02 PM By: [name] [name] Signed: 05/10/2018 5:03:38 PM By: [name] Entered By: [name] on 05/10/2018 08:24:07 [name], [name] ([id number]) -------------------------------------------------------------------------------- Patient/Caregiver Education Details Patient Name: [name], Stokes A. Date of Service: 05/10/2018 8:30 AM Medical Record Number: [id number] Patient Account Number: [id number] Date of Birth/Gender: [birth year] (47 y.o. M) Treating RN: [name] Primary Care Physician: [name] Other Clinician: Referring Physician: Gaetano [name] Treating Physician/Extender: [name] in Treatment: 17 Education Assessment Education Provided To: Patient Education Topics Provided Venous: Handouts: Other: leg elevation and use pumps Methods: Explain/Verbal Responses: State content correctly Electronic Signature(s) Signed: 05/10/2018 5:20:22 PM By: [name] Entered By: [name] on 05/10/2018 09:42:01 Seaberry, Lyam A. ([id number]) -------------------------------------------------------------------------------- Wound Assessment Details Patient Name: Rieves, Stephenson A. Date of Service: 05/10/2018 8:30 AM Medical Record Number: [id number] Patient Account Number: [id number] Date of Birth/Sex: September 24, 1970 (47 y.o. M) Treating RN: [name] Primary Care Mickie Kozikowski: [name] Other Clinician: Referring Thelma Lorenzetti: [name] Treating Meya Clutter/Extender: [name] in Treatment: 19 Wound Status Wound Number: 12 Primary Etiology: Cellulitis Wound Location: Left Lower Leg -  Lateral Wound Status:  Open Wounding Event: Surgical Injury Comorbid Hypertension, Type II Diabetes, History: Neuropathy Date Acquired: 09/18/2017 Weeks Of Treatment: 3 Clustered Wound: Yes Photos Photo Uploaded By: [name] on 05/10/2018 09:10:22 Wound Measurements Length: (cm) 1.6 % Redu Width: (cm) 1.5 % Redu Depth: (cm) 0.1 Epithe Clustered Quantity: 3 Tunnel Area: (cm) 1.885 Under Volume: (cm) 0.188 ction in Area: 88.2% ction in Volume: 96.1% lialization: Large (67-100%) ing: No mining: No Wound Description Classification: Partial Thickness Foul O Wound Margin: Distinct, outline attached Slough Exudate Amount: Large Exudate Type: Serous Exudate Color: amber dor After Cleansing: No /Fibrino Yes Wound Bed Granulation Amount: Small (1-33%) Exposed Structure Granulation Quality: Red, Pink Fascia Exposed: No Necrotic Amount: Small (1-33%) Fat Layer (Subcutaneous Tissue) Exposed: No Necrotic Quality: Adherent Slough Tendon Exposed: No Muscle Exposed: No Joint Exposed: No Bone Exposed: No Periwound Skin Texture Royster, Tylerjames A. ([id number]) Texture Color No Abnormalities Noted: No No Abnormalities Noted: No Callus: No Atrophie Blanche: No Crepitus: No Cyanosis: No Excoriation: No Ecchymosis: No Induration: No Erythema: No Rash: No Hemosiderin Staining: No Scarring: Yes Mottled: No Pallor: No Moisture Rubor: No No Abnormalities Noted: No Dry / Scaly: No Maceration: No Wound Preparation Ulcer Cleansing: Rinsed/Irrigated with Saline Topical Anesthetic Applied: Other: lidocaine 4%, Treatment Notes Wound #12 (Left, Lateral Lower Leg) 1. Cleansed with: Clean wound with Normal Saline Cleanse wound with antibacterial soap and water 2. Anesthetic Topical Lidocaine 4% cream to wound bed prior to debridement 4. Dressing Applied: Calcium Alginate with Silver Other dressing (specify in notes) 5. Secondary Dressing Applied Dry Gauze 7. Secured  with 4-Layer Compression System - Left Lower Extremity Notes silvercel Electronic Signature(s) Signed: 05/10/2018 5:20:22 PM By: [name] Entered By: [name] on 05/10/2018 09:02:22 Sutphen, Rolondo A. ([id number]) -------------------------------------------------------------------------------- Wound Assessment Details Patient Name: Tench, Seydou A. Date of Service: 05/10/2018 8:30 AM Medical Record Number: [id number] Patient Account Number: [id number] Date of Birth/Sex: 11-30-1970 (47 y.o. M) Treating RN: [name] Primary Care Iridessa Harrow: [name] Other Clinician: Referring Morrison Mcbryar: [name] Treating Betsey Sossamon/Extender: [name] in Treatment: 19 Wound Status Wound Number: 13 Primary Etiology: Lymphedema Wound Location: Left Lower Leg - Medial, Distal Wound Status: Open Wounding Event: Gradually Appeared Comorbid Hypertension, Type II Diabetes, History: Neuropathy Date Acquired: 04/25/2018 Weeks Of Treatment: 1 Clustered Wound: No Photos Photo Uploaded By: [name] on 05/10/2018 09:10:23 Wound Measurements Length: (cm) 0.5 Width: (cm) 1.5 Depth: (cm) 0.1 Area: (cm) 0.589 Volume: (cm) 0.059 % Reduction in Area: 94.4% % Reduction in Volume: 97.2% Epithelialization: Large (67-100%) Tunneling: No Undermining: No Wound Description Full Thickness Without Exposed Support Foul Classification: Structures Sloug Wound Margin: Distinct, outline attached Exudate Large Amount: Exudate Type: Serous Exudate Color: amber Odor After Cleansing: No h/Fibrino Yes Wound Bed Granulation Amount: Medium (34-66%) Exposed Structure Granulation Quality: Red, Pink Fascia Exposed: No Necrotic Amount: Medium (34-66%) Fat Layer (Subcutaneous Tissue) Exposed: Yes Necrotic Quality: Adherent Slough Tendon Exposed: No Muscle Exposed: No Joint Exposed: No Bone Exposed: No Berish, Kierre A. ([id number]) Periwound Skin Texture Texture Color No  Abnormalities Noted: No No Abnormalities Noted: No Callus: No Atrophie Blanche: No Crepitus: No Cyanosis: No Excoriation: Yes Ecchymosis: No Induration: No Erythema: Yes Rash: No Erythema Location: Circumferential Scarring: No Hemosiderin Staining: No Mottled: No Moisture Pallor: No No Abnormalities Noted: No Rubor: No Dry / Scaly: No Maceration: No Wound Preparation Ulcer Cleansing: Rinsed/Irrigated with Saline Topical Anesthetic Applied: Other: lidocaine 4%, Treatment Notes Wound #13 (Left, Distal, Medial Lower Leg) 1. Cleansed with: Clean wound with Normal Saline  Cleanse wound with antibacterial soap and water 2. Anesthetic Topical Lidocaine 4% cream to wound bed prior to debridement 4. Dressing Applied: Calcium Alginate with Silver Other dressing (specify in notes) 5. Secondary Dressing Applied Dry Gauze 7. Secured with 4-Layer Compression System - Left Lower Extremity Notes silvercel Electronic Signature(s) Signed: 05/10/2018 5:20:22 PM By: [name] Entered By: [name] on 05/10/2018 09:02:33 Gravatt, Raymon A. ([id number]) -------------------------------------------------------------------------------- Wound Assessment Details Patient Name: Gilkison, Ashton A. Date of Service: 05/10/2018 8:30 AM Medical Record Number: [id number] Patient Account Number: [id number] Date of Birth/Sex: 24-Aug-1971 (47 y.o. M) Treating RN: [name] Primary Care Dayden Viverette: [name] Other Clinician: Referring Turner Kunzman: [name] Treating Shivam Mestas/Extender: [name] in Treatment: 19 Wound Status Wound Number: 14 Primary Etiology: Diabetic Wound/Ulcer of the Lower Extremity Wound Location: Right, Distal, Medial Lower Leg Secondary Lymphedema Wounding Event: Gradually Appeared Etiology: Date Acquired: 04/24/2018 Wound Status: Healed - Epithelialized Weeks Of Treatment: 1 Clustered Wound: No Photos Photo Uploaded By: [name] on  05/10/2018 09:10:40 Wound Measurements Length: (cm) 0 % Width: (cm) 0 % Depth: (cm) 0 Area: (cm) 0 Volume: (cm) 0 Reduction in Area: 100% Reduction in Volume: 100% Wound Description Classification: Grade 2 Periwound Skin Texture Texture Color No Abnormalities Noted: No No Abnormalities Noted: No Moisture No Abnormalities Noted: No Electronic Signature(s) Signed: 05/10/2018 5:20:22 PM By: [name] Entered By: [name] on 05/10/2018 08:58:57 Achterberg, Uziah A. ([id number]) -------------------------------------------------------------------------------- Wound Assessment Details Patient Name: Castellanos, Anselmo A. Date of Service: 05/10/2018 8:30 AM Medical Record Number: [id number] Patient Account Number: [id number] Date of Birth/Sex: 1970/11/08 (47 y.o. M) Treating RN: [name] Primary Care Rhyse Skowron: [name] Other Clinician: Referring Sade Hollon: [name] Treating Rece Zechman/Extender: [name] in Treatment: 19 Wound Status Wound Number: 2 Primary Etiology: Diabetic Wound/Ulcer of the Lower Extremity Wound Location: Left Lower Leg - Lateral, Posterior Secondary Lymphedema Wounding Event: Gradually Appeared Etiology: Date Acquired: 09/30/2017 Wound Status: Open Weeks Of Treatment: 19 Comorbid History: Hypertension, Type II Diabetes, Clustered Wound: No Neuropathy Photos Photo Uploaded By: [name] on 05/10/2018 09:10:40 Wound Measurements Length: (cm) 2.2 Width: (cm) 6.8 Depth: (cm) 0.3 Area: (cm) 11.75 Volume: (cm) 3.525 % Reduction in Area: 58.4% % Reduction in Volume: 68.8% Epithelialization: None Tunneling: No Undermining: No Wound Description Classification: Grade 2 Wound Margin: Flat and Intact Exudate Amount: Medium Exudate Type: Serous Exudate Color: amber Foul Odor After Cleansing: No Slough/Fibrino Yes Wound Bed Granulation Amount: Medium (34-66%) Exposed Structure Granulation Quality: Red,  Hyper-granulation Fascia Exposed: No Necrotic Amount: Medium (34-66%) Fat Layer (Subcutaneous Tissue) Exposed: Yes Necrotic Quality: Adherent Slough Tendon Exposed: No Muscle Exposed: No Joint Exposed: No Bone Exposed: No Periwound Skin Texture Dattilio, Johathon A. ([id number]) Texture Color No Abnormalities Noted: No No Abnormalities Noted: No Callus: No Atrophie Blanche: No Crepitus: No Cyanosis: No Excoriation: No Ecchymosis: No Induration: No Erythema: No Rash: No Hemosiderin Staining: Yes Scarring: No Mottled: No Pallor: No Moisture Rubor: No No Abnormalities Noted: No Dry / Scaly: No Maceration: No Wound Preparation Ulcer Cleansing: Other: soap and water, Topical Anesthetic Applied: Other: lidocaine 4%, Treatment Notes Wound #2 (Left, Lateral, Posterior Lower Leg) 1. Cleansed with: Clean wound with Normal Saline Cleanse wound with antibacterial soap and water 2. Anesthetic Topical Lidocaine 4% cream to wound bed prior to debridement 4. Dressing Applied: Calcium Alginate with Silver Other dressing (specify in notes) 5. Secondary Dressing Applied Dry Gauze 7. Secured with 4-Layer Compression System - Left Lower Extremity Notes silvercel Electronic Signature(s) Signed: 05/10/2018 5:20:22  PM By: [name] Entered By: [name] on 05/10/2018 09:03:21 Cominsky, [name] ([id number]) -------------------------------------------------------------------------------- Vitals Details Patient Name: Scrima, Cleland A. Date of Service: 05/10/2018 8:30 AM Medical Record Number: [id number] Patient Account Number: [id number] Date of Birth/Sex: 02-23-1971 (47 y.o. M) Treating RN: [name] Primary Care Felice Deem: [name] Other Clinician: Referring Yochanan Eddleman: [name] Treating Jarryn Altland/Extender: [name] in Treatment: 19 Vital Signs Time Taken: 08:24 Temperature (F): 98.3 Height (in): 76 Pulse (bpm): 64 Weight (lbs): 406 Respiratory  Rate (breaths/min): 18 Body Mass Index (BMI): 49.4 Blood Pressure (mmHg): 160/75 Reference Range: 80 - 120 mg / dl Electronic Signature(s) Signed: 05/10/2018 1:16:02 PM By: [name] [name] Entered By: Becky Sax, Amado Nash on 05/10/2018 08:27:06

## 2018-05-17 ENCOUNTER — Encounter: Payer: BLUE CROSS/BLUE SHIELD | Admitting: Internal Medicine

## 2018-05-17 DIAGNOSIS — E11622 Type 2 diabetes mellitus with other skin ulcer: Secondary | ICD-10-CM | POA: Diagnosis not present

## 2018-05-20 NOTE — Progress Notes (Signed)
[name] ([id number]) Visit Report for 05/17/2018 Arrival Information Details Patient Name: [name], [name] Date of Service: 05/17/2018 11:15 AM Medical Record Number: [id number] Patient Account Number: [id number] Date of Birth/Sex: 1971-06-23 (47 y.o. M) Treating RN: [name] Primary Care [name]: [name] Other Clinician: Referring [name]: [name] Treating Bohden Dung/Extender: [name] in Treatment: 20 Visit Information History Since Last Visit Added or deleted any medications: Yes Patient Arrived: Ambulatory Any new allergies or adverse reactions: No Arrival Time: 11:05 Had a fall or experienced change in No Accompanied By: self activities of daily living that may affect Transfer Assistance: None risk of falls: Patient Identification Verified: Yes Signs or symptoms of abuse/neglect since last visito No Secondary Verification Process Yes Hospitalized since last visit: No Completed: Implantable device outside of the clinic excluding No Patient Requires Transmission-Based No cellular tissue based products placed in the center Precautions: since last visit: Patient Has Alerts: Yes Has Dressing in Place as Prescribed: Yes Patient Alerts: DM II Pain Present Now: No noncompressible bilateral Electronic Signature(s) Signed: 05/17/2018 3:44:28 PM By: [name] [name] Entered By: [name] on 05/17/2018 11:05:51 [name], [name] ([id number]) -------------------------------------------------------------------------------- Encounter Discharge Information Details Patient Name: [name], [name] Date of Service: 05/17/2018 11:15 AM Medical Record Number: [id number] Patient Account Number: [id number] Date of Birth/Sex: 1971-07-27 (47 y.o. M) Treating RN: [name] Primary Care Gayland Nicol: [name] Other Clinician: Referring Sindhu Nguyen: [name] Treating [name]/Extender: [name] in  Treatment: 20 Encounter Discharge Information Items Discharge Condition: Stable Ambulatory Status: Ambulatory Discharge Destination: Home Transportation: Private Auto Accompanied By: self Schedule Follow-up Appointment: Yes Clinical Summary of Care: Electronic Signature(s) Signed: 05/17/2018 12:36:45 PM By: [name] Entered By: [name] on 05/17/2018 12:36:45 [name], [name] A. ([id number]) -------------------------------------------------------------------------------- Lower Extremity Assessment Details Patient Name: [name], [name] Date of Service: 05/17/2018 11:15 AM Medical Record Number: [id number] Patient Account Number: [id number] Date of Birth/Sex: Jan 13, 1971 (47 y.o. M) Treating RN: [name] Primary Care [name]: [name] Other Clinician: Referring [name]: [name] Treating [name]/Extender: [name] in Treatment: 20 Edema Assessment Assessed: [Left: No] [Right: No] [Left: Edema] [Right: :] Calf Left: Right: Point of Measurement: 36 cm From Medial Instep 58.5 cm cm Ankle Left: Right: Point of Measurement: 14 cm From Medial Instep 44 cm cm Vascular Assessment Claudication: Claudication Assessment [Left:None] Pulses: Dorsalis Pedis Palpable: [Left:Yes] Posterior Tibial Extremity colors, hair growth, and conditions: Extremity Color: [Left:Normal] Hair Growth on Extremity: [Left:No] Temperature of Extremity: [Left:Warm] Capillary Refill: [Left:< 3 seconds] Toe Nail Assessment Left: Right: Thick: Yes Discolored: Yes Deformed: Yes Improper Length and Hygiene: No Electronic Signature(s) Signed: 05/17/2018 1:04:55 PM By: [name] Entered By: [name] on 05/17/2018 11:35:27 [name], [name] A. ([id number]) -------------------------------------------------------------------------------- Multi Wound Chart Details Patient Name: [name], [name] Date of Service: 05/17/2018 11:15 AM Medical Record Number: [id number] Patient Account  Number: [id number] Date of Birth/Sex: November 21, 1970 (47 y.o. M) Treating RN: [name] Primary Care Ellouise Mcwhirter: [name] Other Clinician: Referring [name]: [name] Treating [name]/Extender: [name] in Treatment: 20 Vital Signs Height(in): 76 Pulse(bpm): 65 Weight(lbs): 406 Blood Pressure(mmHg): 169/75 Body Mass Index(BMI): 49 Temperature(F): 98.4 Respiratory Rate 18 (breaths/min): Photos: Wound Location: Left, Lateral Lower Leg Left, Distal, Medial Lower Leg Left Lower Leg - Lateral, Posterior Wounding Event: Surgical Injury Gradually Appeared Gradually Appeared Primary Etiology: Cellulitis Lymphedema Diabetic Wound/Ulcer of the Lower Extremity Secondary Etiology: N/A N/A Lymphedema Comorbid History: Hypertension, Type II Hypertension, Type II Hypertension, Type  II Diabetes, Neuropathy Diabetes, Neuropathy Diabetes, Neuropathy Date Acquired: 09/18/2017 04/25/2018 09/30/2017 Weeks of Treatment: 4 2 20  Wound Status: Healed - Epithelialized Healed - Epithelialized Open Clustered Wound: Yes No No Clustered Quantity: 3 N/A N/A Measurements L x W x D 0x0x0 0x0x0 2.2x5.3x0.3 (cm) Area (cm) : 0 0 9.158 Volume (cm) : 0 0 2.747 % Reduction in Area: 100.00% 100.00% 67.60% % Reduction in Volume: 100.00% 100.00% 75.70% Classification: Partial Thickness Full Thickness Without Grade 2 Exposed Support Structures Exudate Amount: None Present None Present Medium Exudate Type: N/A N/A Serous Exudate Color: N/A N/A amber Wound Margin: Distinct, outline attached Distinct, outline attached Flat and Intact Granulation Amount: None Present (0%) None Present (0%) Medium (34-66%) Granulation Quality: N/A N/A Red, Hyper-granulation Necrotic Amount: None Present (0%) None Present (0%) Medium (34-66%) Exposed Structures: [name], [name] A. ([id number]) Fascia: No Fat Layer (Subcutaneous Fat Layer (Subcutaneous Fat Layer (Subcutaneous Tissue) Exposed: Yes Tissue) Exposed:  Yes Tissue) Exposed: No Fascia: No Fascia: No Tendon: No Tendon: No Tendon: No Muscle: No Muscle: No Muscle: No Joint: No Joint: No Joint: No Bone: No Bone: No Bone: No Epithelialization: Large (67-100%) Large (67-100%) None Periwound Skin Texture: Scarring: Yes Excoriation: Yes Excoriation: No Excoriation: No Induration: No Induration: No Induration: No Callus: No Callus: No Callus: No Crepitus: No Crepitus: No Crepitus: No Rash: No Rash: No Rash: No Scarring: No Scarring: No Periwound Skin Moisture: Maceration: No Maceration: No Maceration: No Dry/Scaly: No Dry/Scaly: No Dry/Scaly: No Periwound Skin Color: Atrophie Blanche: No Atrophie Blanche: No Hemosiderin Staining: Yes Cyanosis: No Cyanosis: No Atrophie Blanche: No Ecchymosis: No Ecchymosis: No Cyanosis: No Erythema: No Erythema: No Ecchymosis: No Hemosiderin Staining: No Hemosiderin Staining: No Erythema: No Mottled: No Mottled: No Mottled: No Pallor: No Pallor: No Pallor: No Rubor: No Rubor: No Rubor: No Tenderness on Palpation: No No No Wound Preparation: Ulcer Cleansing: Ulcer Cleansing: Ulcer Cleansing: Other: soap Rinsed/Irrigated with Saline Rinsed/Irrigated with Saline and water Topical Anesthetic Applied: Topical Anesthetic Applied: Topical Anesthetic Applied: Other: lidocaine 4% Other: lidocaine 4% Other: lidocaine 4% Treatment Notes Electronic Signature(s) Signed: 05/17/2018 4:51:54 PM By: Linton Ham MD Entered By: Linton Ham on 05/17/2018 12:25:10 Kincer, Zakarie AMarland Kitchen ([id number]) -------------------------------------------------------------------------------- Multi-Disciplinary Care Plan Details Patient Name: [name], [name] Date of Service: 05/17/2018 11:15 AM Medical Record Number: [id number] Patient Account Number: [id number] Date of Birth/Sex: 26-Nov-1970 (47 y.o. M) Treating RN: [name] Primary Care Jilleen Essner: [name] Other Clinician: Referring  Cristal Qadir: [name] Treating Malikye Reppond/Extender: [name] in Treatment: 20 Active Inactive ` Abuse / Safety / Falls / Self Care Management Nursing Diagnoses: Potential for falls Goals: Patient will remain injury free related to falls Date Initiated: 12/28/2017 Target Resolution Date: 04/08/2018 Goal Status: Active Interventions: Assess fall risk on admission and as needed Notes: ` Nutrition Nursing Diagnoses: Impaired glucose control: actual or potential Goals: Patient/caregiver verbalizes understanding of need to maintain therapeutic glucose control per primary care physician Date Initiated: 12/28/2017 Target Resolution Date: 04/08/2018 Goal Status: Active Interventions: Provide education on elevated blood sugars and impact on wound healing Notes: ` Orientation to the Wound Care Program Nursing Diagnoses: Knowledge deficit related to the wound healing center program Goals: Patient/caregiver will verbalize understanding of the [place] [facility] Date Initiated: 12/28/2017 Target Resolution Date: 04/08/2018 Goal Status: Active Interventions: Engebretsen, Jamyson A. ([id number]) Provide education on orientation to the wound center Notes: ` Venous Leg Ulcer Nursing Diagnoses: Knowledge deficit related to disease process and management Goals: Patient will maintain optimal edema control Date  Initiated: 03/27/2018 Target Resolution Date: 04/27/2018 Goal Status: Active Interventions: Assess peripheral edema status every visit. Compression as ordered Treatment Activities: Therapeutic compression applied : 03/22/2018 Notes: ` Wound/Skin Impairment Nursing Diagnoses: Impaired tissue integrity Goals: Ulcer/skin breakdown will heal within 14 weeks Date Initiated: 12/28/2017 Target Resolution Date: 04/08/2018 Goal Status: Active Interventions: Assess patient/caregiver ability to obtain necessary supplies Assess patient/caregiver ability to perform ulcer/skin  care regimen upon admission and as needed Assess ulceration(s) every visit Notes: Electronic Signature(s) Signed: 05/18/2018 5:07:26 PM By: Gretta Cool, BSN, RN, CWS, Kim RN, BSN Entered By: Gretta Cool, BSN, RN, CWS, Kim on 05/17/2018 11:41:50 [name], [name] ([id number]) -------------------------------------------------------------------------------- Pain Assessment Details Patient Name: [name], [name] Date of Service: 05/17/2018 11:15 AM Medical Record Number: [id number] Patient Account Number: [id number] Date of Birth/Sex: [birth year] (47 y.o. M) Treating RN: [name] Primary Care Shiri Hodapp: [name] Other Clinician: Referring Sirius Woodford: [name] Treating Shawna Kiener/Extender: [name] in Treatment: 20 Active Problems Location of Pain Severity and Description of Pain Patient Has Paino No Site Locations Pain Management and Medication Current Pain Management: Electronic Signature(s) Signed: 05/17/2018 3:44:28 PM By: [name] [name] Signed: 05/18/2018 5:07:26 PM By: Gretta Cool, BSN, RN, CWS, Kim RN, BSN Entered By: [name] on 05/17/2018 11:05:58 [name], [name] AMarland Kitchen ([id number]) -------------------------------------------------------------------------------- Patient/Caregiver Education Details Patient Name: Games, Albie A. Date of Service: 05/17/2018 11:15 AM Medical Record Number: [id number] Patient Account Number: [id number] Date of Birth/Gender: Jan 01, 1971 (47 y.o. M) Treating RN: [name] Primary Care Physician: [name] Other Clinician: Referring Physician: Gaetano [name] Treating Physician/Extender: [name] in Treatment: 20 Education Assessment Education Provided To: Patient Education Topics Provided Venous: Handouts: Other: wear compression every Methods: Explain/Verbal Responses: State content correctly Electronic Signature(s) Signed: 05/17/2018 4:04:54 PM By: [name] Entered By: [name] on 05/17/2018 12:37:52 [name], [name] ([id number]) -------------------------------------------------------------------------------- Wound Assessment Details Patient Name: [name], [name] Date of Service: 05/17/2018 11:15 AM Medical Record Number: [id number] Patient Account Number: [id number] Date of Birth/Sex: Jun 15, 1971 (47 y.o. M) Treating RN: [name] Primary Care Darline [name]: [name] Other Clinician: Referring Norina Cowper: [name] Treating Tiye Huwe/Extender: [name] in Treatment: 20 Wound Status Wound Number: 12 Primary Etiology: Cellulitis Wound Location: Left, Lateral Lower Leg Wound Status: Healed - Epithelialized Wounding Event: Surgical Injury Comorbid Hypertension, Type II Diabetes, History: Neuropathy Date Acquired: 09/18/2017 Weeks Of Treatment: 4 Clustered Wound: Yes Photos Photo Uploaded By: [name] on 05/17/2018 11:53:05 Wound Measurements Length: (cm) 0 % Red Width: (cm) 0 % Red Depth: (cm) 0 Epith Clustered Quantity: 3 Tunne Area: (cm) 0 Unde Volume: (cm) 0 uction in Area: 100% uction in Volume: 100% elialization: Large (67-100%) ling: No rmining: No Wound Description Classification: Partial Thickness Wound Margin: Distinct, outline attached Exudate Amount: None Present Foul Odor After Cleansing: No Slough/Fibrino Yes Wound Bed Granulation Amount: None Present (0%) Exposed Structure Necrotic Amount: None Present (0%) Fascia Exposed: No Fat Layer (Subcutaneous Tissue) Exposed: No Tendon Exposed: No Muscle Exposed: No Joint Exposed: No Bone Exposed: No Periwound Skin Texture Texture Color Ciampa, Kaylen A. ([id number]) No Abnormalities Noted: No No Abnormalities Noted: No Callus: No Atrophie Blanche: No Crepitus: No Cyanosis: No Excoriation: No Ecchymosis: No Induration: No Erythema: No Rash: No Hemosiderin Staining: No Scarring: Yes Mottled: No Pallor: No Moisture Rubor: No No Abnormalities Noted:  No Dry / Scaly: No Maceration: No Wound Preparation Ulcer Cleansing: Rinsed/Irrigated with Saline Topical Anesthetic Applied: Other: lidocaine 4%, Electronic Signature(s) Signed: 05/18/2018 5:07:26 PM By: Gretta Cool, BSN,  RN, CWS, Kim RN, BSN Entered By: Gretta Cool, BSN, RN, CWS, Kim on 05/17/2018 11:51:20 [name], [name] AMarland Kitchen ([id number]) -------------------------------------------------------------------------------- Wound Assessment Details Patient Name: [name], [name] Date of Service: 05/17/2018 11:15 AM Medical Record Number: [id number] Patient Account Number: [id number] Date of Birth/Sex: [birth year] (47 y.o. M) Treating RN: [name] Primary Care Armando Bukhari: [name] Other Clinician: Referring Milan Perkins: [name] Treating Greenlee Ancheta/Extender: [name] in Treatment: 20 Wound Status Wound Number: 13 Primary Etiology: Lymphedema Wound Location: Left, Distal, Medial Lower Leg Wound Status: Healed - Epithelialized Wounding Event: Gradually Appeared Comorbid Hypertension, Type II Diabetes, History: Neuropathy Date Acquired: 04/25/2018 Weeks Of Treatment: 2 Clustered Wound: No Photos Photo Uploaded By: [name] on 05/17/2018 11:48:45 Wound Measurements Length: (cm) 0 % Red Width: (cm) 0 % Red Depth: (cm) 0 Epith Area: (cm) 0 Volume: (cm) 0 uction in Area: 100% uction in Volume: 100% elialization: Large (67-100%) Wound Description Full Thickness Without Exposed Support Classification: Structures Wound Margin: Distinct, outline attached Exudate None Present Amount: Foul Odor After Cleansing: No Slough/Fibrino No Wound Bed Granulation Amount: None Present (0%) Exposed Structure Necrotic Amount: None Present (0%) Fascia Exposed: No Fat Layer (Subcutaneous Tissue) Exposed: Yes Tendon Exposed: No Muscle Exposed: No Joint Exposed: No Bone Exposed: No Periwound Skin Texture Texture Color Bible, Hiroyuki A. ([id number]) No Abnormalities Noted: No No  Abnormalities Noted: No Callus: No Atrophie Blanche: No Crepitus: No Cyanosis: No Excoriation: Yes Ecchymosis: No Induration: No Erythema: No Rash: No Hemosiderin Staining: No Scarring: No Mottled: No Pallor: No Moisture Rubor: No No Abnormalities Noted: No Dry / Scaly: No Maceration: No Wound Preparation Ulcer Cleansing: Rinsed/Irrigated with Saline Topical Anesthetic Applied: Other: lidocaine 4%, Electronic Signature(s) Signed: 05/18/2018 5:07:26 PM By: Gretta Cool, BSN, RN, CWS, Kim RN, BSN Entered By: Gretta Cool, BSN, RN, CWS, Kim on 05/17/2018 11:51:20 Herro, Giovan AMarland Kitchen ([id number]) -------------------------------------------------------------------------------- Wound Assessment Details Patient Name: Miske, Vyncent A. Date of Service: 05/17/2018 11:15 AM Medical Record Number: [id number] Patient Account Number: [id number] Date of Birth/Sex: 05/13/1971 (47 y.o. M) Treating RN: [name] Primary Care Stacia Feazell: [name] Other Clinician: Referring Shelise Maron: [name] Treating Wendelin Reader/Extender: [name] in Treatment: 20 Wound Status Wound Number: 2 Primary Etiology: Diabetic Wound/Ulcer of the Lower Extremity Wound Location: Left Lower Leg - Lateral, Posterior Secondary Lymphedema Wounding Event: Gradually Appeared Etiology: Date Acquired: 09/30/2017 Wound Status: Open Weeks Of Treatment: 20 Comorbid History: Hypertension, Type II Diabetes, Clustered Wound: No Neuropathy Photos Photo Uploaded By: [name] on 05/17/2018 11:43:08 Wound Measurements Length: (cm) 2.2 Width: (cm) 5.3 Depth: (cm) 0.3 Area: (cm) 9.158 Volume: (cm) 2.747 % Reduction in Area: 67.6% % Reduction in Volume: 75.7% Epithelialization: None Tunneling: No Undermining: No Wound Description Classification: Grade 2 Wound Margin: Flat and Intact Exudate Amount: Medium Exudate Type: Serous Exudate Color: amber Foul Odor After Cleansing: No Slough/Fibrino Yes Wound  Bed Granulation Amount: Medium (34-66%) Exposed Structure Granulation Quality: Red, Hyper-granulation Fascia Exposed: No Necrotic Amount: Medium (34-66%) Fat Layer (Subcutaneous Tissue) Exposed: Yes Necrotic Quality: Adherent Slough Tendon Exposed: No Muscle Exposed: No Joint Exposed: No Bone Exposed: No Periwound Skin Texture Harkin, Antwine A. ([id number]) Texture Color No Abnormalities Noted: No No Abnormalities Noted: No Callus: No Atrophie Blanche: No Crepitus: No Cyanosis: No Excoriation: No Ecchymosis: No Induration: No Erythema: No Rash: No Hemosiderin Staining: Yes Scarring: No Mottled: No Pallor: No Moisture Rubor: No No Abnormalities Noted: No Dry / Scaly: No Maceration: No Wound Preparation Ulcer Cleansing: Other: soap and water, Topical Anesthetic Applied: Other: lidocaine 4%,  Treatment Notes Wound #2 (Left, Lateral, Posterior Lower Leg) 1. Cleansed with: Cleanse wound with antibacterial soap and water 2. Anesthetic Topical Lidocaine 4% cream to wound bed prior to debridement 4. Dressing Applied: Calcium Alginate with Silver Other dressing (specify in notes) 5. Secondary Dressing Applied ABD Pad 7. Secured with 4-Layer Compression System - Left Lower Extremity Notes silvercel, xtrasorb Electronic Signature(s) Signed: 05/17/2018 1:04:55 PM By: [name] Entered By: [name] on 05/17/2018 11:32:28 Alfredo, Savan A. ([id number]) -------------------------------------------------------------------------------- Vitals Details Patient Name: Holroyd, Aneudy A. Date of Service: 05/17/2018 11:15 AM Medical Record Number: [id number] Patient Account Number: [id number] Date of Birth/Sex: 1971-07-03 (47 y.o. M) Treating RN: [name] Primary Care Charon Akamine: [name] Other Clinician: Referring Wilkin Lippy: [name] Treating Sherrine Salberg/Extender: [name] in Treatment: 20 Vital Signs Time Taken: 11:05 Temperature (F): 98.4 Height (in):  76 Pulse (bpm): 65 Weight (lbs): 406 Respiratory Rate (breaths/min): 18 Body Mass Index (BMI): 49.4 Blood Pressure (mmHg): 169/75 Reference Range: 80 - 120 mg / dl Electronic Signature(s) Signed: 05/17/2018 3:44:28 PM By: [name] [name] Entered By: [name] on 05/17/2018 11:10:44

## 2018-05-20 NOTE — Progress Notes (Signed)
MICHIAL, DISNEY ([id number]) Visit Report for 05/17/2018 HPI Details Patient Name: [name], [name] Date of Service: 05/17/2018 11:15 AM Medical Record Number: [id number] Patient Account Number: [id number] Date of Birth/Sex: Mar 12, 1971 (47 y.o. M) Treating RN: Cornell Barman Primary Care Provider: Gaetano Net Other Clinician: Referring Provider: Gaetano Net Treating Provider/Extender: Tito Dine in Treatment: 20 History of Present Illness HPI Description: 12/28/17; this is a now 47 year old man who is a type II diabetic. He was hospitalized from 10/01/17 through 10/19/17. He had an MSSA soft tissue and skin infection. 2 open areas on the left leg were identified he has a smaller area on the left medial calf superiorly just below the knee and a wound just above the left ankle on the posterior medial aspect. I think both of these were surgical IandD sites when he was in the hospital. He was discharged with a wound VAC at that point however this is since been taken off. He follows with Dr. Ola Spurr for the [facility] and he is still on chronic Keflex at 500 twice a day. At that time he was hospitalized his hemoglobin A1c was 15.1 however if I'm reading his endocrinologist notes correctly that is improved. He has been following with Dr. [name] at [facility] and he has been applying calcium alginate and Unna boots. He has home health changing the dressing. They have also been attempting to get him external compression pumps although the patient is unaware whether they've been approved by insurance at this point. as mentioned he has a smaller clean wound on the right lateral calf just below the knee and he has a much larger area just above the left ankle medially and posteriorly. Our intake nurse reported greenish purulent looking drainage.the patient did have surgical material sent to pathology in February. This showed chronic abscess The patient also has lymphedema stage III in the left  greater than right lower extremities. He has a history of blisters with wounds but these of all were always healed. The patient thinks that the lymphedema may have been present since he was about 47 years old i.e. about 30 years. He does not have graded pressure stockings and has not worn stockings. He does not have a distant history of DVT PE or phlebitis. He has not been systemically unwell fever no chills. He states that his Lasix is recently been reduced. He tells me his kidney function is at "30%" and he has been followed by Dr. [name] of nephrology. At one point he was on Lasix 80 twice a day however that's been cut back and he is now on Lasix at 20 twice a day. The patient has a history of PAD listed in his records although he comes from Dr. [name] I don't think is felt to have significant PAD. ABIs in our clinic were noncompressible bilaterally. 01/04/18; patient has a large wound on the left lateral lower calf and a small wound on the left medial upper calf. He has been to see his nephrologist who changed him to Demadex 40 mg a day. I'm hopeful this will help with his systemic fluid overload. He also has stage III lymphedema. Really no change in the 2 wounds since last week 01/11/18; the patient is down 13 pounds. He put his stage III lymphedema left leg in 4K compression last week and there is less edema fluid however we still haven't been able to communicate with home health but apparently [facility] is [facility] but the dressings have not been changed. The  patient noted an odor last week. He is also had compression pumps ordered by [place] this as a not completed the paperwork stage. 01/18/18; patient continues to lose weight. Stage III lymphedema in the left greater than right leg under for alert compression. The major wound is on the left lateral ankle area. He apparently has bilateral compression pumps being brought to his house, these were ordered by [facility] Notable for the fact today he had some blisters on the right anterior leg together with some skin nodules. This is no doubt secondary to severe lymphedema. 01/25/18; the patient has obtained his compression pumps and is using them per vein and vascular instructions 3 times a day for an hour. He also saw [name] of vein and vascular. He was felt to have venous insufficiency but did not suggest any intervention also improved edema. It was suggested that he have compression stockings 20-30 mm on a daily basis in addition to compression pumps. Gaal, Sebastian A. ([id number]) The patient arrives in clinic today with a layer of unna under for layer compression. he seems to have some trouble with the degree of compression. He has open areas on the left lateral ankle area which is his major wound left upper medial calf and a superficial open area on the right anterior shin area which was blistered last week. He has skin changes on the right anterior calf which I think are no doubt secondary to lymphedema skin nodules etc. 02/01/18; the patient comes in telling us his nephrologist have to his torsemide. Unfortunately today he is put on 7 pounds by our scales. He has blisters all over the anterior and medial part of his right calf and a new open wound. He also has soupy green drainage coming out of the left lateral calf /ankle wound. 02/08/18; culture I did last week of the left lateral ankle wound grew both Pseudomonas and Morganella. He is on Keflex from Dr. Ola Spurr in the hospital. I will need to review these notes.in any case Keflex is not going to cover these 2 organisms. I'm probably going to added ciprofloxacin today for 1 week. A lot of drainage that looks purulent last week. He is not complaining of pain however he has managed to put on 10 pounds in 2 weeks by our scales in this clinic. He is going to see his nephrologist tomorrow 02/15/18; he completed the ciprofloxacin I gave him last week.  Notable that he is up to 379 pounds today which is up 16 pounds from 2 weeks ago. He is complaining of orthopnea but doesn't have any chest pain. 02/22/18; he continues to have weight gain. R intake nurse reports again purulent green drainage coming out of the lateral wound on the lateral left calf. He has small open area on the right anterior leg. His torsemide was increased to 2 tablets a day I believe this is 40 mg last week in response to the call admitted to Dr. Keturah Barre office. He follows up with Dr. [name] and Dr. Delana Meyer tomorrow 03/01/18 his weight essentially stable today at 383 pounds. Drainage out of the left lateral wound on the lower left calf/ankle is a lot less. Culture last time grew Pseudomonas. I put him on cefdinir. He has been to Dr. Keturah Barre office no adjustments in his diuretics. Dr. Nicoletta Dress prescribed a wraparound stocking for the right leg.there is no open area on the right leg. He has a superficial area on the left medial calf, left posterior  calf and in the large area on the left lateral however this looks better 03/15/18; weight is not up to 393 pounds. He saw his nephrologist yesterday Dr. [name] will increase the Demadex I'm hopeful this will help with the edema control. I'm using silver alginate to all his wounds. In particular the left lateral ankle looks better. oUnfortunately he has new open areas on the right lateral calf that will include use of his compression stocking at least in the short to medium term. He has new wounds o3 on the right lateral calf. One of these has some size however all numerous superficial 03/22/18; his weight is stabilized a bit. Just adjustment of his diuretics by his nephrologist. There is no doubt he has some degree of systemic fluid overload on top of severe left greater than right lymphedema. 2 weeks ago tried to transition him to stockings on the right leg however he developed re-breakdown of skin on the right leg and we had to put him back  in compression last week. He also uses external compression pumps and claims to be compliant Continued concern about his depression today 03/29/18; several ongoing issues with this patient; oHe no longer has home health coverage apparently secondary to a lapse in insurance. He is apparently transitioning from short for long-term disability. [facility] was changing his compression wraps on Monday and Friday. oHe has gained 10 pounds since last week oSees Dr. [name] tomorrow oSaw his primary doctor last week about the depression. It sounds as though he declined pharmacologic management. He seems somewhat better today. We were concerned last week when he came. Somewhat better today oHe is using his compression pumps once a day at home, I have asked for twice a day if possible especially on the left leg oFollows up with his nephrologist in mid-August. He is managing his diuretic for I think stage IV chronic renal failure oParadoxically his wounds actually look better 04/19/18; the patient has not been seen since I last saw him 3 weeks ago. He saw Dr. Delana Meyer of vascular surgery on 03/30/18 I believe he put him in a 20/30 stocking bilaterally with a wraparound extremitease stocking. He has not been putting anything specifically on the wound. More problematic than that he has not been wearing the stockings he is at home. He has been using his external compression pumps once per day according to him on a rare occasion twice On a psychosocial level the patient is now on long-term disability and is applying for COBRA therefore he is between insurances. He has not been able to follow up with Dr. [name] who is his nephrologist as a result. As noted his weight is up to 407 pounds today. He promises me he'll follow-up with Dr. [name] 05/03/18; he hasn't been here in 2 weeks now. He apparently has been wearing a compression sock on the left leg. Massive increase in edema 3 large open wounds on the left anterior  leg that were probably blisters. Significant deterioration in the left lateral ankle wound that we've been doing as his most problematic wound. He still does not have his insurance issues wrapped up. His weight is well over 400 pounds. 05/10/18; arrives today with better looking edema control in the left leg. He has been using his compression pumps twice a day. We also wrapped it is left leg for there he's been using his pumps so there is much better edema control. Most of new wounds from last week look a lot better. Even the  refractory area on the lower left lateral ankle looks a lot better to me today. [place], Ashante A. ([id number]) He follows up with Dr. [name] this morning [nephrology] 05/17/18 patient arrives today with a lot less edema in the left leg. His weight is gone down 7 pounds. He tells me he saw Dr. [name] but his torsemide was not adjusted. He is using the palms 3 times a day. There is been quite an improvement in the remaining wound on the left lateral ankle Electronic Signature(s) Signed: 05/17/2018 4:51:54 PM By: Linton Ham MD Entered By: Linton Ham on 05/17/2018 12:27:02 Gladwell, Braeton A. ([id number]) -------------------------------------------------------------------------------- Physical Exam Details Patient Name: Cournoyer, Spiros A. Date of Service: 05/17/2018 11:15 AM Medical Record Number: [id number] Patient Account Number: [id number] Date of Birth/Sex: 10/18/1970 (47 y.o. M) Treating RN: Cornell Barman Primary Care Provider: Gaetano Net Other Clinician: Referring Provider: Gaetano Net Treating Provider/Extender: Tito Dine in Treatment: 19 Constitutional Patient is hypertensive.. Pulse regular and within target range for patient.[name] Respirations regular, non-labored and within target range.. Temperature is normal and within the target range for the patient.[name] appears in no distress. Respiratory Respiratory effort is easy and symmetric bilaterally. Rate is  normal at rest and on room air.. Cardiovascular pedal pulses are palpable on the left. there is much less edema in the left leg. He has an ordinary sock on the right leg.[name] Lymphatic none palpable in the popliteal area bilaterally. Integumentary (Hair, Skin) xerotic looking skin in the left leg.[name] Psychiatric Patient appears depressed today. overall somewhat better than 6-8 weeks ago however. Electronic Signature(s) Signed: 05/17/2018 4:51:54 PM By: Linton Ham MD Entered By: Linton Ham on 05/17/2018 12:28:44 Downum, [name] ([id number]) -------------------------------------------------------------------------------- Physician Orders Details Patient Name: Yogi, Alecxander A. Date of Service: 05/17/2018 11:15 AM Medical Record Number: [id number] Patient Account Number: [id number] Date of Birth/Sex: 04/06/1971 (47 y.o. M) Treating RN: Cornell Barman Primary Care Provider: Gaetano Net Other Clinician: Referring Provider: Gaetano Net Treating Provider/Extender: Tito Dine in Treatment: 20 Verbal / Phone Orders: No Diagnosis Coding Wound Cleansing Wound #2 Left,Lateral,Posterior Lower Leg o Clean wound with Normal Saline. o Cleanse wound with mild soap and water Anesthetic (add to Medication List) Wound #2 Left,Lateral,Posterior Lower Leg o Topical Lidocaine 4% cream applied to wound bed prior to debridement (In Clinic Only). Primary Wound Dressing Wound #2 Left,Lateral,Posterior Lower Leg o Silver Alginate Secondary Dressing Wound #2 Left,Lateral,Posterior Lower Leg o Other - xsorb Dressing Change Frequency Wound #2 Left,Lateral,Posterior Lower Leg o Change dressing every week Follow-up Appointments Wound #2 Left,Lateral,Posterior Lower Leg o Return Appointment in 1 week. o Nurse Visit as needed Edema Control Wound #2 Left,Lateral,Posterior Lower Leg o 4-Layer Compression System - Left Lower Extremity. o Compression Pump: Use compression  pump on left lower extremity for 30 minutes, twice daily. o Compression Pump: Use compression pump on right lower extremity for 30 minutes, twice daily. Electronic Signature(s) Signed: 05/17/2018 4:51:54 PM By: Linton Ham MD Signed: 05/18/2018 5:07:26 PM By: Gretta Cool BSN, RN, CWS, Kim RN, BSN Entered By: Gretta Cool, BSN, RN, CWS, Kim on 05/17/2018 11:52:51 Magid, Binh AMarland Kitchen ([id number]) -------------------------------------------------------------------------------- Problem List Details Patient Name: Underhill, Juluis A. Date of Service: 05/17/2018 11:15 AM Medical Record Number: [id number] Patient Account Number: [id number] Date of Birth/Sex: [birth year] (47 y.o. M) Treating RN: Cornell Barman Primary Care Provider: Gaetano Net Other Clinician: Referring Provider: Gaetano Net Treating Provider/Extender: Tito Dine in Treatment: 20 Active Problems ICD-10 Evaluated Encounter Code Description Active Date Today Diagnosis L97.223 Non-pressure chronic  ulcer of left calf with necrosis of muscle 12/28/2017 No Yes I89.0 Lymphedema, not elsewhere classified 12/28/2017 No Yes I87.332 Chronic venous hypertension (idiopathic) with ulcer and 12/28/2017 No Yes inflammation of left lower extremity E11.622 Type 2 diabetes mellitus with other skin ulcer 12/28/2017 No Yes Inactive Problems ICD-10 Code Description Active Date Inactive Date L97.211 Non-pressure chronic ulcer of right calf limited to breakdown of skin 02/22/2018 02/22/2018 Resolved Problems Electronic Signature(s) Signed: 05/17/2018 4:51:54 PM By: Linton Ham MD Entered By: Linton Ham on 05/17/2018 12:25:01 Kelliher, Encarnacion A. ([id number]) -------------------------------------------------------------------------------- Progress Note Details Patient Name: Lebleu, Jahmarion A. Date of Service: 05/17/2018 11:15 AM Medical Record Number: [id number] Patient Account Number: [id number] Date of Birth/Sex: 1970-12-05 (47 y.o. M) Treating RN: Cornell Barman Primary Care Provider: Gaetano Net Other Clinician: Referring Provider: Gaetano Net Treating Provider/Extender: Tito Dine in Treatment: 20 Subjective History of Present Illness (HPI) 12/28/17; this is a now 47 year old man who is a type II diabetic. He was hospitalized from 10/01/17 through 10/19/17. He had an MSSA soft tissue and skin infection. 2 open areas on the left leg were identified he has a smaller area on the left medial calf superiorly just below the knee and a wound just above the left ankle on the posterior medial aspect. I think both of these were surgical IandD sites when he was in the hospital. He was discharged with a wound VAC at that point however this is since been taken off. He follows with Dr. Ola Spurr for the [facility] and he is still on chronic Keflex at 500 twice a day. At that time he was hospitalized his hemoglobin A1c was 15.1 however if I'm reading his endocrinologist notes correctly that is improved. He has been following with Dr. [name] at [facility] and he has been applying calcium alginate and Unna boots. He has home health changing the dressing. They have also been attempting to get him external compression pumps although the patient is unaware whether they've been approved by insurance at this point. as mentioned he has a smaller clean wound on the right lateral calf just below the knee and he has a much larger area just above the left ankle medially and posteriorly. Our intake nurse reported greenish purulent looking drainage.the patient did have surgical material sent to pathology in February. This showed chronic abscess The patient also has lymphedema stage III in the left greater than right lower extremities. He has a history of blisters with wounds but these of all were always healed. The patient thinks that the lymphedema may have been present since he was about 47 years old i.e. about 30 years. He does not have graded pressure  stockings and has not worn stockings. He does not have a distant history of DVT PE or phlebitis. He has not been systemically unwell fever no chills. He states that his Lasix is recently been reduced. He tells me his kidney function is at "30%" and he has been followed by Dr. [name] of nephrology. At one point he was on Lasix 80 twice a day however that's been cut back and he is now on Lasix at 20 twice a day. The patient has a history of PAD listed in his records although he comes from Dr. [name] I don't think is felt to have significant PAD. ABIs in our clinic were noncompressible bilaterally. 01/04/18; patient has a large wound on the left lateral lower calf and a small wound on the left medial upper calf. He has been to  see his nephrologist who changed him to Demadex 40 mg a day. I'm hopeful this will help with his systemic fluid overload. He also has stage III lymphedema. Really no change in the 2 wounds since last week 01/11/18; the patient is down 13 pounds. He put his stage III lymphedema left leg in 4K compression last week and there is less edema fluid however we still haven't been able to communicate with home health but apparently [facility] is [facility] but the dressings have not been changed. The patient noted an odor last week. He is also had compression pumps ordered by [facility] this as a not completed the paperwork stage. 01/18/18; patient continues to lose weight. Stage III lymphedema in the left greater than right leg under for alert compression. The major wound is on the left lateral ankle area. He apparently has bilateral compression pumps being brought to his house, these were ordered by [facility] Notable for the fact today he had some blisters on the right anterior leg together with some skin nodules. This is no doubt secondary to severe lymphedema. 01/25/18; the patient has obtained his compression pumps and is using them per vein and vascular  instructions 3 times a day for an hour. He also saw [name] of vein and vascular. He was felt to have venous insufficiency but did not suggest any intervention also improved edema. It was suggested that he have compression stockings 20-30 mm on a daily basis in addition to compression pumps. The patient arrives in clinic today with a layer of unna under for layer compression. he seems to have some trouble with the degree of compression. He has open areas on the left lateral ankle area which is his major wound left upper medial calf and a [place], Metro A. ([id number]) superficial open area on the right anterior shin area which was blistered last week. He has skin changes on the right anterior calf which I think are no doubt secondary to lymphedema skin nodules etc. 02/01/18; the patient comes in telling us his nephrologist have to his torsemide. Unfortunately today he is put on 7 pounds by our scales. He has blisters all over the anterior and medial part of his right calf and a new open wound. He also has soupy green drainage coming out of the left lateral calf /ankle wound. 02/08/18; culture I did last week of the left lateral ankle wound grew both Pseudomonas and Morganella. He is on Keflex from Dr. Ola Spurr in the hospital. I will need to review these notes.in any case Keflex is not going to cover these 2 organisms. I'm probably going to added ciprofloxacin today for 1 week. A lot of drainage that looks purulent last week. He is not complaining of pain however he has managed to put on 10 pounds in 2 weeks by our scales in this clinic. He is going to see his nephrologist tomorrow 02/15/18; he completed the ciprofloxacin I gave him last week. Notable that he is up to 379 pounds today which is up 16 pounds from 2 weeks ago. He is complaining of orthopnea but doesn't have any chest pain. 02/22/18; he continues to have weight gain. R intake nurse reports again purulent green drainage coming out of the  lateral wound on the lateral left calf. He has small open area on the right anterior leg. His torsemide was increased to 2 tablets a day I believe this is 40 mg last week in response to the call admitted to Dr. Keturah Barre  office. He follows up with Dr. [name] and Dr. Delana Meyer tomorrow 03/01/18 his weight essentially stable today at 383 pounds. Drainage out of the left lateral wound on the lower left calf/ankle is a lot less. Culture last time grew Pseudomonas. I put him on cefdinir. He has been to Dr. Keturah Barre office no adjustments in his diuretics. Dr. Nicoletta Dress prescribed a wraparound stocking for the right leg.there is no open area on the right leg. He has a superficial area on the left medial calf, left posterior calf and in the large area on the left lateral however this looks better 03/15/18; weight is not up to 393 pounds. He saw his nephrologist yesterday Dr. [name] will increase the Demadex I'm hopeful this will help with the edema control. I'm using silver alginate to all his wounds. In particular the left lateral ankle looks better. Unfortunately he has new open areas on the right lateral calf that will include use of his compression stocking at least in the short to medium term. He has new wounds o3 on the right lateral calf. One of these has some size however all numerous superficial 03/22/18; his weight is stabilized a bit. Just adjustment of his diuretics by his nephrologist. There is no doubt he has some degree of systemic fluid overload on top of severe left greater than right lymphedema. 2 weeks ago tried to transition him to stockings on the right leg however he developed re-breakdown of skin on the right leg and we had to put him back in compression last week. He also uses external compression pumps and claims to be compliant Continued concern about his depression today 03/29/18; several ongoing issues with this patient; He no longer has home health coverage apparently secondary to a lapse  in insurance. He is apparently transitioning from short for long-term disability. [facility] was changing his compression wraps on Monday and Friday. He has gained 10 pounds since last week Sees Dr. [name] tomorrow Saw his primary doctor last week about the depression. It sounds as though he declined pharmacologic management. He seems somewhat better today. We were concerned last week when he came. Somewhat better today He is using his compression pumps once a day at home, I have asked for twice a day if possible especially on the left leg Follows up with his nephrologist in mid-August. He is managing his diuretic for I think stage IV chronic renal failure Paradoxically his wounds actually look better 04/19/18; the patient has not been seen since I last saw him 3 weeks ago. He saw Dr. Delana Meyer of vascular surgery on 03/30/18 I believe he put him in a 20/30 stocking bilaterally with a wraparound extremitease stocking. He has not been putting anything specifically on the wound. More problematic than that he has not been wearing the stockings he is at home. He has been using his external compression pumps once per day according to him on a rare occasion twice On a psychosocial level the patient is now on long-term disability and is applying for COBRA therefore he is between insurances. He has not been able to follow up with Dr. [name] who is his nephrologist as a result. As noted his weight is up to 407 pounds today. He promises me he'll follow-up with Dr. [name] 05/03/18; he hasn't been here in 2 weeks now. He apparently has been wearing a compression sock on the left leg. Massive increase in edema 3 large open wounds on the left anterior leg that were probably blisters. Significant deterioration in  the left lateral ankle wound that we've been doing as his most problematic wound. He still does not have his insurance issues wrapped up. His weight is well over 400 pounds. 05/10/18; arrives today with  better looking edema control in the left leg. He has been using his compression pumps twice a day. We also wrapped it is left leg for there he's been using his pumps so there is much better edema control. Most of new wounds from last week look a lot better. Even the refractory area on the lower left lateral ankle looks a lot better to me today. He follows up with Dr. [name] this morning [nephrology] 05/17/18 patient arrives today with a lot less edema in the left leg. His weight is gone down 7 pounds. He tells me he saw Dr. Helene Kelp, Sameul A. ([id number]) [name] but his torsemide was not adjusted. He is using the palms 3 times a day. There is been quite an improvement in the remaining wound on the left lateral ankle Objective Constitutional Patient is hypertensive.. Pulse regular and within target range for patient.[name] Respirations regular, non-labored and within target range.. Temperature is normal and within the target range for the patient.[name] appears in no distress. Vitals Time Taken: 11:05 AM, Height: 76 in, Weight: 406 lbs, BMI: 49.4, Temperature: 98.4 F, Pulse: 65 bpm, Respiratory Rate: 18 breaths/min, Blood Pressure: 169/75 mmHg. Respiratory Respiratory effort is easy and symmetric bilaterally. Rate is normal at rest and on room air.. Cardiovascular pedal pulses are palpable on the left. there is much less edema in the left leg. He has an ordinary sock on the right leg.[name] Lymphatic none palpable in the popliteal area bilaterally. Psychiatric Patient appears depressed today. overall somewhat better than 6-8 weeks ago however. Integumentary (Hair, Skin) xerotic looking skin in the left leg.. Wound #12 status is Healed - Epithelialized. Original cause of wound was Surgical Injury. The wound is located on the Left,Lateral Lower Leg. The wound measures 0cm length x 0cm width x 0cm depth; 0cm^2 area and 0cm^3 volume. There is no tunneling or undermining noted. There is a none present amount of  drainage noted. The wound margin is distinct with the outline attached to the wound base. There is no granulation within the wound bed. There is no necrotic tissue within the wound bed. The periwound skin appearance exhibited: Scarring. The periwound skin appearance did not exhibit: Callus, Crepitus, Excoriation, Induration, Rash, Dry/Scaly, Maceration, Atrophie Blanche, Cyanosis, Ecchymosis, Hemosiderin Staining, Mottled, Pallor, Rubor, Erythema. Wound #13 status is Healed - Epithelialized. Original cause of wound was Gradually Appeared. The wound is located on the Left,Distal,Medial Lower Leg. The wound measures 0cm length x 0cm width x 0cm depth; 0cm^2 area and 0cm^3 volume. There is Fat Layer (Subcutaneous Tissue) Exposed exposed. There is a none present amount of drainage noted. The wound margin is distinct with the outline attached to the wound base. There is no granulation within the wound bed. There is no necrotic tissue within the wound bed. The periwound skin appearance exhibited: Excoriation. The periwound skin appearance did not exhibit: Callus, Crepitus, Induration, Rash, Scarring, Dry/Scaly, Maceration, Atrophie Blanche, Cyanosis, Ecchymosis, Hemosiderin Staining, Mottled, Pallor, Rubor, Erythema. Wound #2 status is Open. Original cause of wound was Gradually Appeared. The wound is located on the Left,Lateral,Posterior Lower Leg. The wound measures 2.2cm length x 5.3cm width x 0.3cm depth; 9.158cm^2 area and 2.747cm^3 volume. There is Fat Layer (Subcutaneous Tissue) Exposed exposed. There is no tunneling or undermining noted. There is a medium amount of serous  drainage noted. The wound margin is flat and intact. There is medium (34-66%) red, hyper - granulation within the wound bed. There is a medium (34-66%) amount of necrotic tissue within the wound bed Schone, Demarkus A. ([id number]) including [place]. The periwound skin appearance exhibited: Hemosiderin Staining. The periwound  skin appearance did not exhibit: Callus, Crepitus, Excoriation, Induration, Rash, Scarring, Dry/Scaly, Maceration, Atrophie Blanche, Cyanosis, Ecchymosis, Mottled, Pallor, Rubor, Erythema. Assessment Active Problems ICD-10 Non-pressure chronic ulcer of left calf with necrosis of muscle Lymphedema, not elsewhere classified Chronic venous hypertension (idiopathic) with ulcer and inflammation of left lower extremity Type 2 diabetes mellitus with other skin ulcer Plan Wound Cleansing: Wound #2 Left,Lateral,Posterior Lower Leg: Clean wound with Normal Saline. Cleanse wound with mild soap and water Anesthetic (add to Medication List): Wound #2 Left,Lateral,Posterior Lower Leg: Topical Lidocaine 4% cream applied to wound bed prior to debridement (In Clinic Only). Primary Wound Dressing: Wound #2 Left,Lateral,Posterior Lower Leg: Silver Alginate Secondary Dressing: Wound #2 Left,Lateral,Posterior Lower Leg: Other - xsorb Dressing Change Frequency: Wound #2 Left,Lateral,Posterior Lower Leg: Change dressing every week Follow-up Appointments: Wound #2 Left,Lateral,Posterior Lower Leg: Return Appointment in 1 week. Nurse Visit as needed Edema Control: Wound #2 Left,Lateral,Posterior Lower Leg: 4-Layer Compression System - Left Lower Extremity. Compression Pump: Use compression pump on left lower extremity for 30 minutes, twice daily. Compression Pump: Use compression pump on right lower extremity for 30 minutes, twice daily. #1 were continuing with silver alginate on the left lateral ankle. This seems to have contracted. More distally there is a full dose of edematous skin which I think has an open area in it as well. We'll try to put silver alginate and this is well #2 we are wrapping 4-layer on the left leg he is using his Compression pumps. Things look quite good here in terms of edema there is no other open wound Frieson, Dariush A. ([id number]) #3 I counseled that I felt he needed to  wear the compression stockings with the juxta lite wrap around that he was prescribed earlier by vein and vascular on the right leg currently. Electronic Signature(s) Signed: 05/17/2018 4:51:54 PM By: Linton Ham MD Entered By: Linton Ham on 05/17/2018 12:30:35 Seaborn, Cristal AMarland Kitchen ([id number]) -------------------------------------------------------------------------------- SuperBill Details Patient Name: Schlotter, Bernice A. Date of Service: 05/17/2018 Medical Record Number: [id number] Patient Account Number: [id number] Date of Birth/Sex: [birth year] (47 y.o. M) Treating RN: Cornell Barman Primary Care Provider: Gaetano Net Other Clinician: Referring Provider: Gaetano Net Treating Provider/Extender: Tito Dine in Treatment: 20 Diagnosis Coding ICD-10 Codes Code Description [id number] Non-pressure chronic ulcer of left calf with necrosis of muscle I89.0 Lymphedema, not elsewhere classified I87.332 Chronic venous hypertension (idiopathic) with ulcer and inflammation of left lower extremity E11.622 Type 2 diabetes mellitus with other skin ulcer Facility Procedures CPT4 Code: [id number] Description: (Facility Use Only) [phone] - [place] COM[facility] LWR LT LEG Modifier: Quantity: 1 Physician Procedures CPT4: Description Modifier Quantity Code [id number] - WC PHYS LEVEL 3 - EST PT 1 ICD-10 Diagnosis Description L97.223 Non-pressure chronic ulcer of left calf with necrosis of muscle I89.0 Lymphedema, not elsewhere classified I87.332 Chronic venous  hypertension (idiopathic) with ulcer and inflammation of left lower extremity E11.622 Type 2 diabetes mellitus with other skin ulcer Electronic Signature(s) Signed: 05/17/2018 4:51:54 PM By: Linton Ham MD Entered By: Linton Ham on 05/17/2018 12:31:03

## 2018-05-23 ENCOUNTER — Other Ambulatory Visit (INDEPENDENT_AMBULATORY_CARE_PROVIDER_SITE_OTHER): Payer: Self-pay | Admitting: Vascular Surgery

## 2018-05-23 DIAGNOSIS — N186 End stage renal disease: Secondary | ICD-10-CM

## 2018-05-24 ENCOUNTER — Telehealth (INDEPENDENT_AMBULATORY_CARE_PROVIDER_SITE_OTHER): Payer: Self-pay | Admitting: Vascular Surgery

## 2018-05-24 ENCOUNTER — Encounter: Payer: BLUE CROSS/BLUE SHIELD | Admitting: Internal Medicine

## 2018-05-24 DIAGNOSIS — E11622 Type 2 diabetes mellitus with other skin ulcer: Secondary | ICD-10-CM | POA: Diagnosis not present

## 2018-05-25 NOTE — Telephone Encounter (Signed)
Paperwork was faxed again to the [employer].

## 2018-05-26 NOTE — Progress Notes (Signed)
ESSA, MALACHI ([id number]) Visit Report for 05/24/2018 Arrival Information Details Patient Name: [name], [name] Date of Service: 05/24/2018 9:30 AM Medical Record Number: [id number] Patient Account Number: [id number] Date of Birth/Sex: 02-09-1971 (47 y.o. M) Treating RN: Cornell Barman Primary Care Infant Zink: Gaetano Net Other Clinician: Referring Madix Blowe: Gaetano Net Treating Deron Poole/Extender: Tito Dine in Treatment: 21 Visit Information History Since Last Visit Added or deleted any medications: Yes Patient Arrived: Ambulatory Any new allergies or adverse reactions: No Arrival Time: 09:19 Had a fall or experienced change in No Accompanied By: self activities of daily living that may affect Transfer Assistance: None risk of falls: Patient Identification Verified: Yes Signs or symptoms of abuse/neglect since last visito No Secondary Verification Process Yes Hospitalized since last visit: No Completed: Implantable device outside of the clinic excluding No Patient Requires Transmission-Based No cellular tissue based products placed in the center Precautions: since last visit: Patient Has Alerts: Yes Has Dressing in Place as Prescribed: Yes Patient Alerts: DM II Pain Present Now: No noncompressible bilateral Electronic Signature(s) Signed: 05/24/2018 11:53:05 AM By: Lorine Bears RCP, RRT, CHT Entered By: Lorine Bears on 05/24/2018 09:20:40 [name], [name] ([id number]) -------------------------------------------------------------------------------- Encounter Discharge Information Details Patient Name: [name], [name] Date of Service: 05/24/2018 9:30 AM Medical Record Number: [id number] Patient Account Number: [id number] Date of Birth/Sex: 1971-02-13 (47 y.o. M) Treating RN: Montey Hora Primary Care Katerra Ingman: Gaetano Net Other Clinician: Referring Keian Odriscoll: Gaetano Net Treating Garrit Marrow/Extender: Tito Dine in  Treatment: 21 Encounter Discharge Information Items Discharge Condition: Stable Ambulatory Status: Ambulatory Discharge Destination: Home Transportation: Private Auto Accompanied By: self Schedule Follow-up Appointment: Yes Clinical Summary of Care: Post Procedure Vitals: Temperature (F): 97.8 Pulse (bpm): 58 Respiratory Rate (breaths/min): 18 Blood Pressure (mmHg): 166/81 Electronic Signature(s) Signed: 05/24/2018 11:54:57 AM By: Montey Hora Entered By: Montey Hora on 05/24/2018 11:54:57 Tinnon, Demetric A. ([id number]) -------------------------------------------------------------------------------- Lower Extremity Assessment Details Patient Name: Hornberger, Christop A. Date of Service: 05/24/2018 9:30 AM Medical Record Number: [id number] Patient Account Number: [id number] Date of Birth/Sex: 1971-02-04 (47 y.o. M) Treating RN: Roger Shelter Primary Care Anea Fodera: Gaetano Net Other Clinician: Referring Keyleigh Manninen: Gaetano Net Treating Jaggar Benko/Extender: Tito Dine in Treatment: 21 Edema Assessment Assessed: [Left: No] [Right: No] Edema: [Left: Yes] [Right: Yes] Calf Left: Right: Point of Measurement: 36 cm From Medial Instep 62.5 cm 56.5 cm Ankle Left: Right: Point of Measurement: 14 cm From Medial Instep 48.4 cm 41 cm Vascular Assessment Claudication: Claudication Assessment [Left:None] [Right:None] Pulses: Dorsalis Pedis Palpable: [Left:Yes] [Right:Yes] Posterior Tibial Extremity colors, hair growth, and conditions: Extremity Color: [Left:Hyperpigmented] [Right:Hyperpigmented] Hair Growth on Extremity: [Left:Yes] [Right:Yes] Temperature of Extremity: [Left:Warm] [Right:Warm] Capillary Refill: [Left:< 3 seconds] [Right:< 3 seconds] Toe Nail Assessment Left: Right: Thick: Yes Yes Discolored: Yes Yes Deformed: Yes Yes Improper Length and Hygiene: Yes Yes Electronic Signature(s) Signed: 05/24/2018 4:06:29 PM By: Roger Shelter Entered By: Roger Shelter on 05/24/2018 09:43:17 Serda, Clarion A. ([id number]) -------------------------------------------------------------------------------- Multi Wound Chart Details Patient Name: [name], [name] Date of Service: 05/24/2018 9:30 AM Medical Record Number: [id number] Patient Account Number: [id number] Date of Birth/Sex: December 23, 1970 (47 y.o. M) Treating RN: Cornell Barman Primary Care Loys Shugars: Gaetano Net Other Clinician: Referring Cianni Manny: Gaetano Net Treating Elisha Cooksey/Extender: Tito Dine in Treatment: 21 Vital Signs Height(in): 76 Pulse(bpm): 58 Weight(lbs): 401.1 Blood Pressure(mmHg): 166/81 Body Mass Index(BMI): 49 Temperature(F): 97.8 Respiratory Rate 18 (breaths/min): Photos: [15:No Photos] [16:No Photos] [2:No Photos] Wound Location: [15:Left Lower Leg - Proximal] [16:Right Lower Leg - Posterior] [  2:Left Lower Leg - Lateral, Posterior] Wounding Event: [15:Gradually Appeared] [16:Gradually Appeared] [2:Gradually Appeared] Primary Etiology: [15:Lymphedema] [16:Lymphedema] [2:Diabetic Wound/Ulcer of the Lower Extremity] Secondary Etiology: [15:N/A] [16:N/A] [2:Lymphedema] Comorbid History: [15:Hypertension, Type II Diabetes, Neuropathy] [16:Hypertension, Type II Diabetes, Neuropathy] [2:Hypertension, Type II Diabetes, Neuropathy] Date Acquired: [15:05/24/2018] [16:05/20/2018] [2:09/30/2017] Weeks of Treatment: [15:0] [16:0] [2:21] Wound Status: [15:Open] [16:Open] [2:Open] Measurements L x W x D [15:1.5x2x0.1] [16:7.5x5.2x0.1] [2:2x5.4x0.3] (cm) Area (cm) : [15:2.356] [16:30.631] [2:8.482] Volume (cm) : [15:0.236] [16:3.063] [2:2.545] % Reduction in Area: [15:N/A] [16:N/A] [2:70.00%] % Reduction in Volume: [15:N/A] [16:N/A] [2:77.50%] Classification: [15:Partial Thickness] [16:Full Thickness Without Exposed Support Structures] [2:Grade 2] Exudate Amount: [15:Large] [16:Large] [2:Large] Exudate Type: [15:Serous] [16:Serous] [2:Serous] Exudate Color: [15:amber]  [16:amber] [2:amber] Foul Odor After Cleansing: [15:Yes] [16:Yes] [2:No] Odor Anticipated Due to [15:No] [16:No] [2:N/A] Product Use: Wound Margin: [15:Flat and Intact] [16:Distinct, outline attached] [2:Flat and Intact] Granulation Amount: [15:Large (67-100%)] [16:Large (67-100%)] [2:Medium (34-66%)] Granulation Quality: [15:Red, Pink] [16:Red, Pink] [2:Red, Hyper-granulation] Necrotic Amount: [15:Small (1-33%)] [16:Small (1-33%)] [2:Medium (34-66%)] Necrotic Tissue: [15:Adherent Slough] [16:Eschar, Adherent Slough] [2:Adherent Slough] Exposed Structures: [15:Fat Layer (Subcutaneous Tissue) Exposed: Yes Fascia: No Tendon: No Muscle: No] [16:Fat Layer (Subcutaneous Tissue) Exposed: Yes Fascia: No Tendon: No Muscle: No] [2:Fat Layer (Subcutaneous Tissue) Exposed: Yes Fascia: No Tendon: No Muscle: No] Joint: No Joint: No Joint: No Bone: No Bone: No Bone: No Epithelialization: None None Small (1-33%) Debridement: N/A N/A Debridement - Excisional Pre-procedure N/A N/A 10:06 Verification/Time Out Taken: Pain Control: N/A N/A Other Tissue Debrided: N/A N/A Subcutaneous, Slough Level: N/A N/A Skin/Subcutaneous Tissue Debridement Area (sq cm): N/A N/A 10.8 Instrument: N/A N/A Curette Bleeding: N/A N/A Moderate Hemostasis Achieved: N/A N/A Pressure Debridement Treatment N/A N/A Procedure was tolerated well Response: Post Debridement N/A N/A 2x5.4x0.3 Measurements L x W x D (cm) Post Debridement Volume: N/A N/A 2.545 (cm) Periwound Skin Texture: Excoriation: No Excoriation: No Excoriation: No Induration: No Induration: No Induration: No Callus: No Callus: No Callus: No Crepitus: No Crepitus: No Crepitus: No Rash: No Rash: No Rash: No Scarring: No Scarring: No Scarring: No Periwound Skin Moisture: Maceration: No Maceration: No Maceration: No Dry/Scaly: No Dry/Scaly: No Dry/Scaly: No Periwound Skin Color: Atrophie Blanche: No Atrophie Blanche: No Hemosiderin  Staining: Yes Cyanosis: No Cyanosis: No Atrophie Blanche: No Ecchymosis: No Ecchymosis: No Cyanosis: No Erythema: No Erythema: No Ecchymosis: No Hemosiderin Staining: No Hemosiderin Staining: No Erythema: No Mottled: No Mottled: No Mottled: No Pallor: No Pallor: No Pallor: No Rubor: No Rubor: No Rubor: No Temperature: No Abnormality No Abnormality No Abnormality Tenderness on Palpation: Yes Yes Yes Wound Preparation: Ulcer Cleansing: Ulcer Cleansing: Ulcer Cleansing: Other: soap Rinsed/Irrigated with Saline Rinsed/Irrigated with Saline and water Topical Anesthetic Applied: Topical Anesthetic Applied: Topical Anesthetic Applied: Other: lidocaine 4% Other: lidocaine 4% Other: lidocaine 4% Procedures Performed: N/A N/A Debridement Treatment Notes Electronic Signature(s) Signed: 05/24/2018 6:15:06 PM By: Linton Ham MD Entered By: Linton Ham on 05/24/2018 10:37:12 [place], [place] ([id number]) -------------------------------------------------------------------------------- Vanduser Details Patient Name: [name], [name] Date of Service: 05/24/2018 9:30 AM Medical Record Number: [id number] Patient Account Number: [id number] Date of Birth/Sex: [birth year] (47 y.o. M) Treating RN: Cornell Barman Primary Care Jamael Hoffmann: Gaetano Net Other Clinician: Referring Advaith Lamarque: Gaetano Net Treating Jesilyn Easom/Extender: Tito Dine in Treatment: 21 Active Inactive ` Abuse / Safety / Falls / Self Care Management Nursing Diagnoses: Potential for falls Goals: Patient will remain injury free related to falls Date Initiated: 12/28/2017 Target Resolution Date: 04/08/2018 Goal Status: Active Interventions: Assess fall  risk on admission and as needed Notes: ` Nutrition Nursing Diagnoses: Impaired glucose control: actual or potential Goals: Patient/caregiver verbalizes understanding of need to maintain therapeutic glucose control per primary care  physician Date Initiated: 12/28/2017 Target Resolution Date: 04/08/2018 Goal Status: Active Interventions: Provide education on elevated blood sugars and impact on wound healing Notes: ` Orientation to the Wound Care Program Nursing Diagnoses: Knowledge deficit related to the wound healing center program Goals: Patient/caregiver will verbalize understanding of the [place] Date Initiated: 12/28/2017 Target Resolution Date: 04/08/2018 Goal Status: Active Interventions: Nygren, Rohaan A. ([id number]) Provide education on orientation to the wound center Notes: ` Venous Leg Ulcer Nursing Diagnoses: Knowledge deficit related to disease process and management Goals: Patient will maintain optimal edema control Date Initiated: 03/27/2018 Target Resolution Date: 04/27/2018 Goal Status: Active Interventions: Assess peripheral edema status every visit. Compression as ordered Treatment Activities: Therapeutic compression applied : 03/22/2018 Notes: ` Wound/Skin Impairment Nursing Diagnoses: Impaired tissue integrity Goals: Ulcer/skin breakdown will heal within 14 weeks Date Initiated: 12/28/2017 Target Resolution Date: 04/08/2018 Goal Status: Active Interventions: Assess patient/caregiver ability to obtain necessary supplies Assess patient/caregiver ability to perform ulcer/skin care regimen upon admission and as needed Assess ulceration(s) every visit Notes: Electronic Signature(s) Signed: 05/24/2018 5:31:05 PM By: Gretta Cool, BSN, RN, CWS, Kim RN, BSN Entered By: Gretta Cool, BSN, RN, CWS, Kim on 05/24/2018 10:02:20 [name], [name] ([id number]) -------------------------------------------------------------------------------- Pain Assessment Details Patient Name: [name], [name] Date of Service: 05/24/2018 9:30 AM Medical Record Number: [id number] Patient Account Number: [id number] Date of Birth/Sex: 08/12/1971 (47 y.o. M) Treating RN: Cornell Barman Primary Care Merrell Borsuk: Gaetano Net Other Clinician: Referring Charlyne Robertshaw: Gaetano Net Treating Madelline Eshbach/Extender: Tito Dine in Treatment: 21 Active Problems Location of Pain Severity and Description of Pain Patient Has Paino No Site Locations Pain Management and Medication Current Pain Management: Electronic Signature(s) Signed: 05/24/2018 11:53:05 AM By: Lorine Bears RCP, RRT, CHT Signed: 05/24/2018 5:31:05 PM By: Gretta Cool, BSN, RN, CWS, Kim RN, BSN Entered By: Lorine Bears on 05/24/2018 09:20:48 [name], [name] ([id number]) -------------------------------------------------------------------------------- Patient/Caregiver Education Details Patient Name: Reist, Pharaoh A. Date of Service: 05/24/2018 9:30 AM Medical Record Number: [id number] Patient Account Number: [id number] Date of Birth/Gender: 1970-12-27 (47 y.o. M) Treating RN: Cornell Barman Primary Care Physician: Gaetano Net Other Clinician: Referring Physician: Gaetano Net Treating Physician/Extender: Tito Dine in Treatment: 21 Education Assessment Education Provided To: Patient Education Topics Provided Venous: Handouts: Controlling Swelling with Multilayered Compression Wraps, Other: Do not get wraps wet. Methods: Demonstration, Explain/Verbal Responses: State content correctly Wound/Skin Impairment: Handouts: Caring for Your Ulcer Methods: Demonstration Responses: State content correctly Electronic Signature(s) Signed: 05/24/2018 5:31:05 PM By: Gretta Cool, BSN, RN, CWS, Kim RN, BSN Entered By: Gretta Cool, BSN, RN, CWS, Kim on 05/24/2018 10:06:19 [name], [name] Kitchen ([id number]) -------------------------------------------------------------------------------- Wound Assessment Details Patient Name: [name], [name] Date of Service: 05/24/2018 9:30 AM Medical Record Number: [id number] Patient Account Number: [id number] Date of Birth/Sex: [birth year] (47 y.o. M) Treating RN: Roger Shelter Primary Care  Ossiel Marchio: Gaetano Net Other Clinician: Referring Gaberiel Youngblood: Gaetano Net Treating TRUE Shackleford/Extender: Tito Dine in Treatment: 21 Wound Status Wound Number: 15 Primary Etiology: Lymphedema Wound Location: Left Lower Leg - Proximal Wound Status: Open Wounding Event: Gradually Appeared Comorbid Hypertension, Type II Diabetes, History: Neuropathy Date Acquired: 05/24/2018 Weeks Of Treatment: 0 Clustered Wound: No Photos Photo Uploaded By: Roger Shelter on 05/24/2018 16:16:48 Wound Measurements Length: (cm) 1.5 Width: (cm) 2 Depth: (cm) 0.1 Area: (cm) 2.356 Volume: (cm) 0.236 % Reduction  in Area: % Reduction in Volume: Epithelialization: None Tunneling: No Undermining: No Wound Description Classification: Partial Thickness Foul Wound Margin: Flat and Intact Due Exudate Amount: Large Slou Exudate Type: Serous Exudate Color: amber Odor After Cleansing: Yes to Product Use: No gh/Fibrino No Wound Bed Granulation Amount: Large (67-100%) Exposed Structure Granulation Quality: Red, Pink Fascia Exposed: No Necrotic Amount: Small (1-33%) Fat Layer (Subcutaneous Tissue) Exposed: Yes Necrotic Quality: Adherent Slough Tendon Exposed: No Muscle Exposed: No Joint Exposed: No Bone Exposed: No Periwound Skin Texture Hepburn, Reason A. ([id number]) Texture Color No Abnormalities Noted: No No Abnormalities Noted: No Callus: No Atrophie Blanche: No Crepitus: No Cyanosis: No Excoriation: No Ecchymosis: No Induration: No Erythema: No Rash: No Hemosiderin Staining: No Scarring: No Mottled: No Pallor: No Moisture Rubor: No No Abnormalities Noted: No Dry / Scaly: No Temperature / Pain Maceration: No Temperature: No Abnormality Tenderness on Palpation: Yes Wound Preparation Ulcer Cleansing: Rinsed/Irrigated with Saline Topical Anesthetic Applied: Other: lidocaine 4%, Treatment Notes Wound #15 (Left, Proximal Lower Leg) 1. Cleansed with: Cleanse wound  with antibacterial soap and water 2. Anesthetic Topical Lidocaine 4% cream to wound bed prior to debridement 4. Dressing Applied: Calcium Alginate with Silver Other dressing (specify in notes) 5. Secondary Dressing Applied ABD Pad 7. Secured with 4 Layer Compression System - Bilateral Notes silvercel, xtrasorb Electronic Signature(s) Signed: 05/24/2018 4:06:29 PM By: Roger Shelter Entered By: Roger Shelter on 05/24/2018 09:37:36 [name], [name] ([id number]) -------------------------------------------------------------------------------- Wound Assessment Details Patient Name: [name], [name] Date of Service: 05/24/2018 9:30 AM Medical Record Number: [id number] Patient Account Number: [id number] Date of Birth/Sex: 1971-07-16 (47 y.o. M) Treating RN: Roger Shelter Primary Care Acelynn Dejonge: Gaetano Net Other Clinician: Referring Kimiko Common: Gaetano Net Treating Babygirl Trager/Extender: Tito Dine in Treatment: 21 Wound Status Wound Number: 16 Primary Etiology: Lymphedema Wound Location: Right Lower Leg - Posterior Wound Status: Open Wounding Event: Gradually Appeared Comorbid Hypertension, Type II Diabetes, History: Neuropathy Date Acquired: 05/20/2018 Weeks Of Treatment: 0 Clustered Wound: No Photos Photo Uploaded By: Roger Shelter on 05/24/2018 16:16:48 Wound Measurements Length: (cm) 7.5 Width: (cm) 5.2 Depth: (cm) 0.1 Area: (cm) 30.631 Volume: (cm) 3.063 % Reduction in Area: % Reduction in Volume: Epithelialization: None Tunneling: No Undermining: No Wound Description Full Thickness Without Exposed Support Foul Classification: Structures Due Wound Margin: Distinct, outline attached Slou Exudate Large Amount: Exudate Type: Serous Exudate Color: amber Odor After Cleansing: Yes to Product Use: No gh/Fibrino Yes Wound Bed Granulation Amount: Large (67-100%) Exposed Structure Granulation Quality: Red, Pink Fascia Exposed: No Necrotic  Amount: Small (1-33%) Fat Layer (Subcutaneous Tissue) Exposed: Yes Necrotic Quality: Eschar, Adherent Slough Tendon Exposed: No Muscle Exposed: No Joint Exposed: No Bone Exposed: No [name], [name] ([id number]) Periwound Skin Texture Texture Color No Abnormalities Noted: No No Abnormalities Noted: No Callus: No Atrophie Blanche: No Crepitus: No Cyanosis: No Excoriation: No Ecchymosis: No Induration: No Erythema: No Rash: No Hemosiderin Staining: No Scarring: No Mottled: No Pallor: No Moisture Rubor: No No Abnormalities Noted: No Dry / Scaly: No Temperature / Pain Maceration: No Temperature: No Abnormality Tenderness on Palpation: Yes Wound Preparation Ulcer Cleansing: Rinsed/Irrigated with Saline Topical Anesthetic Applied: Other: lidocaine 4%, Treatment Notes Wound #16 (Right, Posterior Lower Leg) 1. Cleansed with: Cleanse wound with antibacterial soap and water 2. Anesthetic Topical Lidocaine 4% cream to wound bed prior to debridement 4. Dressing Applied: Calcium Alginate with Silver Other dressing (specify in notes) 5. Secondary Dressing Applied ABD Pad 7. Secured with 4 Layer Compression System - Bilateral Notes silvercel, xtrasorb  Electronic Signature(s) Signed: 05/24/2018 4:06:29 PM By: Roger Shelter Entered By: Roger Shelter on 05/24/2018 09:39:59 Mccarley, Charlton [name] Kitchen ([id number]) -------------------------------------------------------------------------------- Wound Assessment Details Patient Name: Lepp, Airon A. Date of Service: 05/24/2018 9:30 AM Medical Record Number: [id number] Patient Account Number: [id number] Date of Birth/Sex: 05/22/1971 (47 y.o. M) Treating RN: Roger Shelter Primary Care Posey Jasmin: Gaetano Net Other Clinician: Referring Crysta Gulick: Gaetano Net Treating Pamela Intrieri/Extender: Tito Dine in Treatment: 21 Wound Status Wound Number: 2 Primary Etiology: Diabetic Wound/Ulcer of the Lower Extremity Wound  Location: Left Lower Leg - Lateral, Posterior Secondary Lymphedema Wounding Event: Gradually Appeared Etiology: Date Acquired: 09/30/2017 Wound Status: Open Weeks Of Treatment: 21 Comorbid History: Hypertension, Type II Diabetes, Clustered Wound: No Neuropathy Photos Photo Uploaded By: Roger Shelter on 05/24/2018 16:17:07 Wound Measurements Length: (cm) 2 Width: (cm) 5.4 Depth: (cm) 0.3 Area: (cm) 8.482 Volume: (cm) 2.545 % Reduction in Area: 70% % Reduction in Volume: 77.5% Epithelialization: Small (1-33%) Tunneling: No Undermining: No Wound Description Classification: Grade 2 Wound Margin: Flat and Intact Exudate Amount: Large Exudate Type: Serous Exudate Color: amber Foul Odor After Cleansing: No Slough/Fibrino Yes Wound Bed Granulation Amount: Medium (34-66%) Exposed Structure Granulation Quality: Red, Hyper-granulation Fascia Exposed: No Necrotic Amount: Medium (34-66%) Fat Layer (Subcutaneous Tissue) Exposed: Yes Necrotic Quality: Adherent Slough Tendon Exposed: No Muscle Exposed: No Joint Exposed: No Bone Exposed: No Periwound Skin Texture [place], Venus A. ([id number]) Texture Color No Abnormalities Noted: No No Abnormalities Noted: No Callus: No Atrophie Blanche: No Crepitus: No Cyanosis: No Excoriation: No Ecchymosis: No Induration: No Erythema: No Rash: No Hemosiderin Staining: Yes Scarring: No Mottled: No Pallor: No Moisture Rubor: No No Abnormalities Noted: No Dry / Scaly: No Temperature / Pain Maceration: No Temperature: No Abnormality Tenderness on Palpation: Yes Wound Preparation Ulcer Cleansing: Other: soap and water, Topical Anesthetic Applied: Other: lidocaine 4%, Treatment Notes Wound #2 (Left, Lateral, Posterior Lower Leg) 1. Cleansed with: Cleanse wound with antibacterial soap and water 2. Anesthetic Topical Lidocaine 4% cream to wound bed prior to debridement 4. Dressing Applied: Calcium Alginate with  Silver Other dressing (specify in notes) 5. Secondary Dressing Applied ABD Pad 7. Secured with 4 Layer Compression System - Bilateral Notes silvercel, xtrasorb Electronic Signature(s) Signed: 05/24/2018 4:06:29 PM By: Roger Shelter Entered By: Roger Shelter on 05/24/2018 09:34:56 Cruickshank, Rahil A. ([id number]) -------------------------------------------------------------------------------- Vitals Details Patient Name: Egle, Jovani A. Date of Service: 05/24/2018 9:30 AM Medical Record Number: [id number] Patient Account Number: [id number] Date of Birth/Sex: 1971/05/24 (47 y.o. M) Treating RN: Cornell Barman Primary Care Elianys Conry: Gaetano Net Other Clinician: Referring Gavriella Hearst: Gaetano Net Treating Todrick Siedschlag/Extender: Tito Dine in Treatment: 21 Vital Signs Time Taken: 09:20 Temperature (F): 97.8 Height (in): 76 Pulse (bpm): 58 Weight (lbs): 401.1 Respiratory Rate (breaths/min): 18 Body Mass Index (BMI): 48.8 Blood Pressure (mmHg): 166/81 Reference Range: 80 - 120 mg / dl Electronic Signature(s) Signed: 05/24/2018 11:53:05 AM By: Lorine Bears RCP, RRT, CHT Entered By: Becky Sax, Amado Nash on 05/24/2018 09:24:07

## 2018-05-26 NOTE — Progress Notes (Signed)
[name] ([id number]) Visit Report for 05/24/2018 Debridement Details Patient Name: [name], [name] Date of Service: 05/24/2018 9:30 AM Medical Record Number: [id number] Patient Account Number: [id number] Date of Birth/Sex: 1971-03-09 (47 y.o. M) Treating RN: Cornell Barman Primary Care Provider: Gaetano Net Other Clinician: Referring Provider: Gaetano Net Treating Provider/Extender: Tito Dine in Treatment: 21 Debridement Performed for Wound #2 Left,Lateral,Posterior Lower Leg Assessment: Performed By: Physician Ricard Dillon, MD Debridement Type: Debridement Severity of Tissue Pre Fat layer exposed Debridement: Level of Consciousness (Pre- Awake and Alert procedure): Pre-procedure Verification/Time Yes - 10:06 Out Taken: Start Time: 10:06 Pain Control: Other : lidocaine 4% Total Area Debrided (L x W): 2 (cm) x 5.4 (cm) = 10.8 (cm) Tissue and other material Slough, Subcutaneous, Fibrin/Exudate, Slough debrided: Level: Skin/Subcutaneous Tissue Debridement Description: Excisional Instrument: Curette Bleeding: Moderate Hemostasis Achieved: Pressure End Time: 10:08 Response to Treatment: Procedure was tolerated well Level of Consciousness Awake and Alert (Post-procedure): Post Debridement Measurements of Total Wound Length: (cm) 2 Width: (cm) 5.4 Depth: (cm) 0.3 Volume: (cm) 2.545 Character of Wound/Ulcer Post Debridement: Requires Further Debridement Severity of Tissue Post Debridement: Fat layer exposed Post Procedure Diagnosis Same as Pre-procedure Electronic Signature(s) Signed: 05/24/2018 5:31:05 PM By: Gretta Cool, BSN, RN, CWS, Kim RN, BSN Signed: 05/24/2018 6:15:06 PM By: Linton Ham MD Entered By: Linton Ham on 05/24/2018 10:37:51 [name], [name] ([id number]) -------------------------------------------------------------------------------- HPI Details Patient Name: [name], [name] Date of Service: 05/24/2018 9:30 AM Medical  Record Number: [id number] Patient Account Number: [id number] Date of Birth/Sex: 12/31/1970 (47 y.o. M) Treating RN: Cornell Barman Primary Care Provider: Gaetano Net Other Clinician: Referring Provider: Gaetano Net Treating Provider/Extender: Tito Dine in Treatment: 21 History of Present Illness HPI Description: 12/28/17; this is a now 47 year old man who is a type II diabetic. He was hospitalized from 10/01/17 through 10/19/17. He had an MSSA soft tissue and skin infection. 2 open areas on the left leg were identified he has a smaller area on the left medial calf superiorly just below the knee and a wound just above the left ankle on the posterior medial aspect. I think both of these were surgical IandD sites when he was in the hospital. He was discharged with a wound VAC at that point however this is since been taken off. He follows with Dr. Ola Spurr for the [facility] and he is still on chronic Keflex at 500 twice a day. At that time he was hospitalized his hemoglobin A1c was 15.1 however if I'm reading his endocrinologist notes correctly that is improved. He has been following with Dr. [name] at vein and vascular and he has been applying calcium alginate and Unna boots. He has home health changing the dressing. They have also been attempting to get him external compression pumps although the patient is unaware whether they've been approved by insurance at this point. as mentioned he has a smaller clean wound on the right lateral calf just below the knee and he has a much larger area just above the left ankle medially and posteriorly. Our intake nurse reported greenish purulent looking drainage.the patient did have surgical material sent to pathology in February. This showed chronic abscess The patient also has lymphedema stage III in the left greater than right lower extremities. He has a history of blisters with wounds but these of all were always healed. The patient thinks that the  lymphedema may have been present since he was about 47 years old i.e. about 30 years. He does not have  graded pressure stockings and has not worn stockings. He does not have a distant history of DVT PE or phlebitis. He has not been systemically unwell fever no chills. He states that his Lasix is recently been reduced. He tells me his kidney function is at "30%" and he has been followed by Dr. [name] of nephrology. At one point he was on Lasix 80 twice a day however that's been cut back and he is now on Lasix at 20 twice a day. The patient has a history of PAD listed in his records although he comes from Dr. [name] I don't think is felt to have significant PAD. ABIs in our clinic were noncompressible bilaterally. 01/04/18; patient has a large wound on the left lateral lower calf and a small wound on the left medial upper calf. He has been to see his nephrologist who changed him to Demadex 40 mg a day. I'm hopeful this will help with his systemic fluid overload. He also has stage III lymphedema. Really no change in the 2 wounds since last week 01/11/18; the patient is down 13 pounds. He put his stage III lymphedema left leg in 4K compression last week and there is less edema fluid however we still haven't been able to communicate with home health but apparently it is [facility] but the dressings have not been changed. The patient noted an odor last week. He is also had compression pumps ordered by [place] [facility] this as a not completed the paperwork stage. 01/18/18; patient continues to lose weight. Stage III lymphedema in the left greater than right leg under for alert compression. The major wound is on the left lateral ankle area. He apparently has bilateral compression pumps being brought to his house, these were ordered by [place] [facility] Notable for the fact today he had some blisters on the right anterior leg together with some skin nodules. This is no doubt secondary to  severe lymphedema. 01/25/18; the patient has obtained his compression pumps and is using them per vein and vascular instructions 3 times a day for an hour. He also saw [name] of vein and vascular. He was felt to have venous insufficiency but did not suggest any intervention also improved edema. It was suggested that he have compression stockings 20-30 mm on a daily basis in addition to compression pumps. The patient arrives in clinic today with a layer of unna under for layer compression. he seems to have some trouble with the degree of compression. He has open areas on the left lateral ankle area which is his major wound left upper medial calf and a superficial open area on the right anterior shin area which was blistered last week. He has skin changes on the right anterior [name], [name] ([id number]) calf which I think are no doubt secondary to lymphedema skin nodules etc. 02/01/18; the patient comes in telling us his nephrologist have to his torsemide. Unfortunately today he is put on 7 pounds by our scales. He has blisters all over the anterior and medial part of his right calf and a new open wound. He also has soupy green drainage coming out of the left lateral calf /ankle wound. 02/08/18; culture I did last week of the left lateral ankle wound grew both Pseudomonas and Morganella. He is on Keflex from Dr. Ola Spurr in the hospital. I will need to review these notes.in any case Keflex is not going to cover these 2 organisms. I'm probably going to added ciprofloxacin today for 1 week.  A lot of drainage that looks purulent last week. He is not complaining of pain however he has managed to put on 10 pounds in 2 weeks by our scales in this clinic. He is going to see his nephrologist tomorrow 02/15/18; he completed the ciprofloxacin I gave him last week. Notable that he is up to 379 pounds today which is up 16 pounds from 2 weeks ago. He is complaining of orthopnea but doesn't have any chest  pain. 02/22/18; he continues to have weight gain. R intake nurse reports again purulent green drainage coming out of the lateral wound on the lateral left calf. He has small open area on the right anterior leg. His torsemide was increased to 2 tablets a day I believe this is 40 mg last week in response to the call admitted to Dr. Keturah Barre office. He follows up with Dr. [name] and Dr. Delana Meyer tomorrow 03/01/18 his weight essentially stable today at 383 pounds. Drainage out of the left lateral wound on the lower left calf/ankle is a lot less. Culture last time grew Pseudomonas. I put him on cefdinir. He has been to Dr. Keturah Barre office no adjustments in his diuretics. Dr. Nicoletta Dress prescribed a wraparound stocking for the right leg.there is no open area on the right leg. He has a superficial area on the left medial calf, left posterior calf and in the large area on the left lateral however this looks better 03/15/18; weight is not up to 393 pounds. He saw his nephrologist yesterday Dr. [name] will increase the Demadex I'm hopeful this will help with the edema control. I'm using silver alginate to all his wounds. In particular the left lateral ankle looks better. oUnfortunately he has new open areas on the right lateral calf that will include use of his compression stocking at least in the short to medium term. He has new wounds o3 on the right lateral calf. One of these has some size however all numerous superficial 03/22/18; his weight is stabilized a bit. Just adjustment of his diuretics by his nephrologist. There is no doubt he has some degree of systemic fluid overload on top of severe left greater than right lymphedema. 2 weeks ago tried to transition him to stockings on the right leg however he developed re-breakdown of skin on the right leg and we had to put him back in compression last week. He also uses external compression pumps and claims to be compliant Continued concern about his depression  today 03/29/18; several ongoing issues with this patient; oHe no longer has home health coverage apparently secondary to a lapse in insurance. He is apparently transitioning from short for long-term disability. [facility] was changing his compression wraps on Monday and Friday. oHe has gained 10 pounds since last week oSees Dr. [name] tomorrow oSaw his primary doctor last week about the depression. It sounds as though he declined pharmacologic management. He seems somewhat better today. We were concerned last week when he came. Somewhat better today oHe is using his compression pumps once a day at home, I have asked for twice a day if possible especially on the left leg oFollows up with his nephrologist in mid-August. He is managing his diuretic for I think stage IV chronic renal failure oParadoxically his wounds actually look better 04/19/18; the patient has not been seen since I last saw him 3 weeks ago. He saw Dr. Delana Meyer of vascular surgery on 03/30/18 I believe he put him in a 20/30 stocking bilaterally with a wraparound extremitease  stocking. He has not been putting anything specifically on the wound. More problematic than that he has not been wearing the stockings he is at home. He has been using his external compression pumps once per day according to him on a rare occasion twice On a psychosocial level the patient is now on long-term disability and is applying for COBRA therefore he is between insurances. He has not been able to follow up with Dr. [name] who is his nephrologist as a result. As noted his weight is up to 407 pounds today. He promises me he'll follow-up with Dr. [name] 05/03/18; he hasn't been here in 2 weeks now. He apparently has been wearing a compression sock on the left leg. Massive increase in edema 3 large open wounds on the left anterior leg that were probably blisters. Significant deterioration in the left lateral ankle wound that we've been doing as his most  problematic wound. He still does not have his insurance issues wrapped up. His weight is well over 400 pounds. 05/10/18; arrives today with better looking edema control in the left leg. He has been using his compression pumps twice a day. We also wrapped it is left leg for there he's been using his pumps so there is much better edema control. Most of new wounds from last week look a lot better. Even the refractory area on the lower left lateral ankle looks a lot better to me today. He follows up with Dr. [name] this morning [nephrology] 05/17/18 patient arrives today with a lot less edema in the left leg. His weight is gone down 7 pounds. He tells me he saw Dr. [name] but his torsemide was not adjusted. He is using the palms 3 times a day. There is been quite an improvement in the Petrovic, Sailor A. ([id number]) remaining wound on the left lateral ankle oWeekly visit for follow-up of bilateral lower extremity wounds related to severe lymphedema and probably some degree of systemic fluid overload from chronic renal failure stage IV. His weight is up this week to over 400 pounds. He noted increasing edema in the right leg late last week. He took his compression stocking off he did not increase the frequency of this compression pump use. He developed a large blister on the back of the right calf. He also has a new opened blister on the left lateral calf in addition to the wound that we've been using on the distal left lower calf area and we've been using silver alginate. He tells me that he has an ultrasound which is a DVT rule out and I think a reflux study ordered by Dr. [name] Electronic Signature(s) Signed: 05/24/2018 6:15:06 PM By: Linton Ham MD Entered By: Linton Ham on 05/24/2018 10:41:54 [name], [name] ([id number]) -------------------------------------------------------------------------------- Physical Exam Details Patient Name: [name], [name] Date of Service: 05/24/2018 9:30  AM Medical Record Number: [id number] Patient Account Number: [id number] Date of Birth/Sex: 09-16-1970 (47 y.o. M) Treating RN: Cornell Barman Primary Care Provider: Gaetano Net Other Clinician: Referring Provider: Gaetano Net Treating Provider/Extender: Tito Dine in Treatment: 21 Constitutional Patient is hypertensive.. Pulse regular and within target range for patient.[name] Respirations regular, non-labored and within target range.. Temperature is normal and within the target range for the patient.[name] appears in no distress. Eyes Conjunctivae clear. No discharge. Respiratory Respiratory effort is easy and symmetric bilaterally. Rate is normal at rest and on room air.. Notes wound exam; othe patient's wound that we have been following as on the left lateral calf.  In the middle of a divot of soft tissue/lymphedema. Most of this appears to be fully epithelialized. Necrotic surface debris removed with a #5 curet reveals a generally healthy looking small remaining wound. Hemostasis with direct pressure oHe has 2 new blisters one sizably on the right posterior calf and one on the left lateral calf. He has more edema in both legs no doubt accounting for this. There is no evidence of infection Electronic Signature(s) Signed: 05/24/2018 6:15:06 PM By: Linton Ham MD Entered By: Linton Ham on 05/24/2018 10:48:05 Rabon, [name] Kitchen ([id number]) -------------------------------------------------------------------------------- Physician Orders Details Patient Name: [name], [name] Date of Service: 05/24/2018 9:30 AM Medical Record Number: [id number] Patient Account Number: [id number] Date of Birth/Sex: December 15, 1970 (47 y.o. M) Treating RN: Cornell Barman Primary Care Provider: Gaetano Net Other Clinician: Referring Provider: Gaetano Net Treating Provider/Extender: Tito Dine in Treatment: 18 Verbal / Phone Orders: No Diagnosis Coding Wound Cleansing Wound #15  Left,Proximal Lower Leg o Clean wound with Normal Saline. o Cleanse wound with mild soap and water Wound #16 Right,Posterior Lower Leg o Clean wound with Normal Saline. o Cleanse wound with mild soap and water Wound #2 Left,Lateral,Posterior Lower Leg o Clean wound with Normal Saline. o Cleanse wound with mild soap and water Primary Wound Dressing Wound #15 Left,Proximal Lower Leg o Silver Alginate Wound #16 Right,Posterior Lower Leg o Silver Alginate Wound #2 Left,Lateral,Posterior Lower Leg o Silver Alginate Secondary Dressing Wound #15 Left,Proximal Lower Leg o Other - xsorb Wound #16 Right,Posterior Lower Leg o Other - xsorb Wound #2 Left,Lateral,Posterior Lower Leg o Other - xsorb Dressing Change Frequency Wound #15 Left,Proximal Lower Leg o Change dressing every week Wound #16 Right,Posterior Lower Leg o Change dressing every week Wound #2 Left,Lateral,Posterior Lower Leg o Change dressing every week Follow-up Appointments Gohlke, Wiatt A. ([id number]) Wound #15 Left,Proximal Lower Leg o Return Appointment in 1 week. Wound #16 Right,Posterior Lower Leg o Return Appointment in 1 week. Wound #2 Left,Lateral,Posterior Lower Leg o Return Appointment in 1 week. Edema Control Wound #2 Left,Lateral,Posterior Lower Leg o 4 Layer Compression System - Bilateral o Compression Pump: Use compression pump on left lower extremity for 30 minutes, twice daily. - 1 Hour twice Daily o Compression Pump: Use compression pump on right lower extremity for 30 minutes, twice daily. - 1 Hour twice daily Electronic Signature(s) Signed: 05/24/2018 5:31:05 PM By: Gretta Cool, BSN, RN, CWS, Kim RN, BSN Signed: 05/24/2018 6:15:06 PM By: Linton Ham MD Entered By: Gretta Cool, BSN, RN, CWS, Kim on 05/24/2018 10:04:19 Ruberg, Zaiden [name] Kitchen ([id number]) -------------------------------------------------------------------------------- Problem List Details Patient Name:  [name], [name] Date of Service: 05/24/2018 9:30 AM Medical Record Number: [id number] Patient Account Number: [id number] Date of Birth/Sex: 1971/01/02 (47 y.o. 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 ([id number]) -------------------------------------------------------------------------------- Progress Note Details Patient Name: [name], [name] Date  of Service: 05/24/2018 9:30 AM Medical Record Number: [id number] Patient Account Number: [id number] Date of Birth/Sex: August 05, 1971 (47 y.o. M) Treating RN: Cornell Barman Primary Care Provider: Gaetano Net Other Clinician: Referring Provider: Gaetano Net Treating Provider/Extender: Tito Dine in Treatment: 21 Subjective History of Present Illness (HPI) 12/28/17; this is a now 46 year old man who is a type II diabetic. He was hospitalized from 10/01/17 through 10/19/17. He had an MSSA soft tissue and skin infection. 2 open areas on the left leg were identified he has a smaller area on the left medial calf superiorly just below the knee and a wound just above the left ankle on the posterior medial aspect. I  think both of these were surgical IandD sites when he was in the hospital. He was discharged with a wound VAC at that point however this is since been taken off. He follows with Dr. Ola Spurr for the [facility] and he is still on chronic Keflex at 500 twice a day. At that time he was hospitalized his hemoglobin A1c was 15.1 however if I'm reading his endocrinologist notes correctly that is improved. He has been following with Dr. [name] at [facility] and he has been applying calcium alginate and Unna boots. He has home health changing the dressing. They have also been attempting to get him external compression pumps although the patient is unaware whether they've been approved by insurance at this point. as mentioned he has a smaller clean wound on the right lateral calf just below the knee and he has a much larger area just above the left ankle medially and posteriorly. Our intake nurse reported greenish purulent looking drainage.the patient did have surgical material sent to pathology in February. This showed chronic abscess The patient also has lymphedema stage III in the left greater than right lower extremities. He has a history of blisters with wounds but these of all were always healed. The patient thinks that the lymphedema may have been present since he was about 47 years old i.e. about 30 years. He does not have graded pressure stockings and has not worn stockings. He does not have a distant history of DVT PE or phlebitis. He has not been systemically unwell fever no chills. He states that his Lasix is recently been reduced. He tells me his kidney function is at "30%" and he has been followed by Dr. [name] of nephrology. At one point he was on Lasix 80 twice a day however that's been cut back and he is now on Lasix at 20 twice a day. The patient has a history of PAD listed in his records although he comes from Dr. [name] I don't think is felt to have significant PAD. ABIs in our  clinic were noncompressible bilaterally. 01/04/18; patient has a large wound on the left lateral lower calf and a small wound on the left medial upper calf. He has been to see his nephrologist who changed him to Demadex 40 mg a day. I'm hopeful this will help with his systemic fluid overload. He also has stage III lymphedema. Really no change in the 2 wounds since last week 01/11/18; the patient is down 13 pounds. He put his stage III lymphedema left leg in 4K compression last week and there is less edema fluid however we still haven't been able to communicate with home health but apparently [facility] is [facility] but the dressings have not been changed. The patient noted an odor last week. He is also had compression pumps ordered by [facility]  [facility] this as a not completed the paperwork stage. 01/18/18; patient continues to lose weight. Stage III lymphedema in the left greater than right leg under for alert compression. The major wound is on the left lateral ankle area. He apparently has bilateral compression pumps being brought to his house, these were ordered by [facility] [facility] Notable for the fact today he had some blisters on the right anterior leg together with some skin nodules. This is no doubt secondary to severe lymphedema. 01/25/18; the patient has obtained his compression pumps and is using them per vein and vascular instructions 3 times a day for an hour. He also saw [name] of vein and vascular. He was felt to have venous insufficiency but did not suggest any intervention also improved edema. It was suggested that he have compression stockings 20-30 mm on a daily basis in addition to compression pumps. The patient arrives in clinic today with a layer of unna under for layer compression. he seems to have some trouble with the degree of compression. He has open areas on the left lateral ankle area which is his major wound left upper medial calf and a Mink, Cohl A.  ([id number]) superficial open area on the right anterior shin area which was blistered last week. He has skin changes on the right anterior calf which I think are no doubt secondary to lymphedema skin nodules etc. 02/01/18; the patient comes in telling us his nephrologist have to his torsemide. Unfortunately today he is put on 7 pounds by our scales. He has blisters all over the anterior and medial part of his right calf and a new open wound. He also has soupy green drainage coming out of the left lateral calf /ankle wound. 02/08/18; culture I did last week of the left lateral ankle wound grew both Pseudomonas and Morganella. He is on Keflex from Dr. Ola Spurr in the hospital. I will need to review these notes.in any case Keflex is not going to cover these 2 organisms. I'm probably going to added ciprofloxacin today for 1 week. A lot of drainage that looks purulent last week. He is not complaining of pain however he has managed to put on 10 pounds in 2 weeks by our scales in this clinic. He is going to see his nephrologist tomorrow 02/15/18; he completed the ciprofloxacin I gave him last week. Notable that he is up to 379 pounds today which is up 16 pounds from 2 weeks ago. He is complaining of orthopnea but doesn't have any chest pain. 02/22/18; he continues to have weight gain. R intake nurse reports again purulent green drainage coming out of the lateral wound on the lateral left calf. He has small open area on the right anterior leg. His torsemide was increased to 2 tablets a day I believe this is 40 mg last week in response to the call admitted to Dr. Keturah Barre office. He follows up with Dr. [name] and Dr. Delana Meyer tomorrow 03/01/18 his weight essentially stable today at 383 pounds. Drainage out of the left lateral wound on the lower left calf/ankle is a lot less. Culture last time grew Pseudomonas. I put him on cefdinir. He has been to Dr. Keturah Barre office no adjustments in his diuretics. Dr. Nicoletta Dress  prescribed a wraparound stocking for the right leg.there is no open area on the right leg. He has a superficial area on the left medial calf, left posterior calf and in the large area on the left lateral however this looks better 03/15/18;  weight is not up to 393 pounds. He saw his nephrologist yesterday Dr. [name] will increase the Demadex I'm hopeful this will help with the edema control. I'm using silver alginate to all his wounds. In particular the left lateral ankle looks better. Unfortunately he has new open areas on the right lateral calf that will include use of his compression stocking at least in the short to medium term. He has new wounds o3 on the right lateral calf. One of these has some size however all numerous superficial 03/22/18; his weight is stabilized a bit. Just adjustment of his diuretics by his nephrologist. There is no doubt he has some degree of systemic fluid overload on top of severe left greater than right lymphedema. 2 weeks ago tried to transition him to stockings on the right leg however he developed re-breakdown of skin on the right leg and we had to put him back in compression last week. He also uses external compression pumps and claims to be compliant Continued concern about his depression today 03/29/18; several ongoing issues with this patient; He no longer has home health coverage apparently secondary to a lapse in insurance. He is apparently transitioning from short for long-term disability. [facility] was changing his compression wraps on Monday and Friday. He has gained 10 pounds since last week Sees Dr. [name] tomorrow Saw his primary doctor last week about the depression. It sounds as though he declined pharmacologic management. He seems somewhat better today. We were concerned last week when he came. Somewhat better today He is using his compression pumps once a day at home, I have asked for twice a day if possible especially on the left  leg Follows up with his nephrologist in mid-August. He is managing his diuretic for I think stage IV chronic renal failure Paradoxically his wounds actually look better 04/19/18; the patient has not been seen since I last saw him 3 weeks ago. He saw Dr. Delana Meyer of vascular surgery on 03/30/18 I believe he put him in a 20/30 stocking bilaterally with a wraparound extremitease stocking. He has not been putting anything specifically on the wound. More problematic than that he has not been wearing the stockings he is at home. He has been using his external compression pumps once per day according to him on a rare occasion twice On a psychosocial level the patient is now on long-term disability and is applying for COBRA therefore he is between insurances. He has not been able to follow up with Dr. [name] who is his nephrologist as a result. As noted his weight is up to 407 pounds today. He promises me he'll follow-up with Dr. [name] 05/03/18; he hasn't been here in 2 weeks now. He apparently has been wearing a compression sock on the left leg. Massive increase in edema 3 large open wounds on the left anterior leg that were probably blisters. Significant deterioration in the left lateral ankle wound that we've been doing as his most problematic wound. He still does not have his insurance issues wrapped up. His weight is well over 400 pounds. 05/10/18; arrives today with better looking edema control in the left leg. He has been using his compression pumps twice a day. We also wrapped it is left leg for there he's been using his pumps so there is much better edema control. Most of new wounds from last week look a lot better. Even the refractory area on the lower left lateral ankle looks a lot better to me today. He  follows up with Dr. [name] this morning [nephrology] 05/17/18 patient arrives today with a lot less edema in the left leg. His weight is gone down 7 pounds. He tells me he saw Dr. Helene Kelp, Gergory A.  ([id number]) [name] but his torsemide was not adjusted. He is using the palms 3 times a day. There is been quite an improvement in the remaining wound on the left lateral ankle Weekly visit for follow-up of bilateral lower extremity wounds related to severe lymphedema and probably some degree of systemic fluid overload from chronic renal failure stage IV. His weight is up this week to over 400 pounds. He noted increasing edema in the right leg late last week. He took his compression stocking off he did not increase the frequency of this compression pump use. He developed a large blister on the back of the right calf. He also has a new opened blister on the left lateral calf in addition to the wound that we've been using on the distal left lower calf area and we've been using silver alginate. He tells me that he has an ultrasound which is a DVT rule out and I think a reflux study ordered by Dr. [name] Objective Constitutional Patient is hypertensive.. Pulse regular and within target range for patient.[name] Respirations regular, non-labored and within target range.. Temperature is normal and within the target range for the patient.[name] appears in no distress. Vitals Time Taken: 9:20 AM, Height: 76 in, Weight: 401.1 lbs, BMI: 48.8, Temperature: 97.8 F, Pulse: 58 bpm, Respiratory Rate: 18 breaths/min, Blood Pressure: 166/81 mmHg. Eyes Conjunctivae clear. No discharge. Respiratory Respiratory effort is easy and symmetric bilaterally. Rate is normal at rest and on room air.. General Notes: wound exam; the patient's wound that we have been following as on the left lateral calf. In the middle of a divot of soft tissue/lymphedema. Most of this appears to be fully epithelialized. Necrotic surface debris removed with a #5 curet reveals a generally healthy looking small remaining wound. Hemostasis with direct pressure He has 2 new blisters one sizably on the right posterior calf and one on the left lateral calf.  He has more edema in both legs no doubt accounting for this. There is no evidence of infection Integumentary (Hair, Skin) Wound #15 status is Open. Original cause of wound was Gradually Appeared. The wound is located on the Left,Proximal Lower Leg. The wound measures 1.5cm length x 2cm width x 0.1cm depth; 2.356cm^2 area and 0.236cm^3 volume. There is Fat Layer (Subcutaneous Tissue) Exposed exposed. There is no tunneling or undermining noted. There is a large amount of serous drainage noted. Foul odor after cleansing was noted. The wound margin is flat and intact. There is large (67-100%) red, pink granulation within the wound bed. There is a small (1-33%) amount of necrotic tissue within the wound bed including Adherent Slough. The periwound skin appearance did not exhibit: Callus, Crepitus, Excoriation, Induration, Rash, Scarring, Dry/Scaly, Maceration, Atrophie Blanche, Cyanosis, Ecchymosis, Hemosiderin Staining, Mottled, Pallor, Rubor, Erythema. Periwound temperature was noted as No Abnormality. The periwound has tenderness on palpation. Wound #16 status is Open. Original cause of wound was Gradually Appeared. The wound is located on the Right,Posterior Lower Leg. The wound measures 7.5cm length x 5.2cm width x 0.1cm depth; 30.631cm^2 area and 3.063cm^3 volume. There is Fat Layer (Subcutaneous Tissue) Exposed exposed. There is no tunneling or undermining noted. There is a large amount of serous drainage noted. Foul odor after cleansing was noted. The wound margin is distinct with the outline attached  to the wound base. There is large (67-100%) red, pink granulation within the wound bed. There is a small (1-33%) amount of necrotic tissue within the wound bed including Eschar and Adherent Slough. The periwound skin appearance did not exhibit: Callus, Crepitus, Excoriation, Induration, Rash, Scarring, Dry/Scaly, Maceration, Atrophie Blanche, Cyanosis, Ecchymosis, Hemosiderin Staining, Mottled,  Pallor, Rubor, Erythema. Periwound temperature was noted as No Abnormality. The [name], [name] ([id number]) periwound has tenderness on palpation. Wound #2 status is Open. Original cause of wound was Gradually Appeared. The wound is located on the Left,Lateral,Posterior Lower Leg. The wound measures 2cm length x 5.4cm width x 0.3cm depth; 8.482cm^2 area and 2.545cm^3 volume. There is Fat Layer (Subcutaneous Tissue) Exposed exposed. There is no tunneling or undermining noted. There is a large amount of serous drainage noted. The wound margin is flat and intact. There is medium (34-66%) red, hyper - granulation within the wound bed. There is a medium (34-66%) amount of necrotic tissue within the wound bed including Adherent Slough. The periwound skin appearance exhibited: Hemosiderin Staining. The periwound skin appearance did not exhibit: Callus, Crepitus, Excoriation, Induration, Rash, Scarring, Dry/Scaly, Maceration, Atrophie Blanche, Cyanosis, Ecchymosis, Mottled, Pallor, Rubor, Erythema. Periwound temperature was noted as No Abnormality. The periwound has tenderness on palpation. Assessment Active Problems ICD-10 Non-pressure chronic ulcer of left calf with necrosis of muscle Lymphedema, not elsewhere classified Chronic venous hypertension (idiopathic) with ulcer and inflammation of left lower extremity Type 2 diabetes mellitus with other skin ulcer Procedures Wound #2 Pre-procedure diagnosis of Wound #2 is a Diabetic Wound/Ulcer of the Lower Extremity located on the Left,Lateral,Posterior Lower Leg .Severity of Tissue Pre Debridement is: Fat layer exposed. There was a Excisional Skin/Subcutaneous Tissue Debridement with a total area of 10.8 sq cm performed by Ricard Dillon, MD. With the following instrument(s): Curette Material removed includes Subcutaneous Tissue, Slough, and Fibrin/Exudate after achieving pain control using Other (lidocaine 4%). No specimens were taken. A time out  was conducted at 10:06, prior to the start of the procedure. A Moderate amount of bleeding was controlled with Pressure. The procedure was tolerated well. Post Debridement Measurements: 2cm length x 5.4cm width x 0.3cm depth; 2.545cm^3 volume. Character of Wound/Ulcer Post Debridement requires further debridement. Severity of Tissue Post Debridement is: Fat layer exposed. Post procedure Diagnosis Wound #2: Same as Pre-Procedure Plan Wound Cleansing: Wound #15 Left,Proximal Lower Leg: Clean wound with Normal Saline. Cleanse wound with mild soap and water Wound #16 Right,Posterior Lower Leg: Clean wound with Normal Saline. Cleanse wound with mild soap and water [name], [name] ([id number]) Wound #2 Left,Lateral,Posterior Lower Leg: Clean wound with Normal Saline. Cleanse wound with mild soap and water Primary Wound Dressing: Wound #15 Left,Proximal Lower Leg: Silver Alginate Wound #16 Right,Posterior Lower Leg: Silver Alginate Wound #2 Left,Lateral,Posterior Lower Leg: Silver Alginate Secondary Dressing: Wound #15 Left,Proximal Lower Leg: Other - xsorb Wound #16 Right,Posterior Lower Leg: Other - xsorb Wound #2 Left,Lateral,Posterior Lower Leg: Other - xsorb Dressing Change Frequency: Wound #15 Left,Proximal Lower Leg: Change dressing every week Wound #16 Right,Posterior Lower Leg: Change dressing every week Wound #2 Left,Lateral,Posterior Lower Leg: Change dressing every week Follow-up Appointments: Wound #15 Left,Proximal Lower Leg: Return Appointment in 1 week. Wound #16 Right,Posterior Lower Leg: Return Appointment in 1 week. Wound #2 Left,Lateral,Posterior Lower Leg: Return Appointment in 1 week. Edema Control: Wound #2 Left,Lateral,Posterior Lower Leg: 4 Layer Compression System - Bilateral Compression Pump: Use compression pump on left lower extremity for 30 minutes, twice daily. - 1 Hour twice Daily Compression  Pump: Use compression pump on right lower extremity  for 30 minutes, twice daily. - 1 Hour twice daily #1 we are going to continue silver alginate to the open area that we've been treating which is just about closed. #2 unfortunately he has 2 large blistered areas one on the posterior right calf which is the most extensive and a smaller area on the left lateral. We are going to have to wrap both legs this time. #3 I counseled him that if he develops more edema in his lower extremities in the future he probably needs to increase the pump frequency to 3 times a day instead of 2 and not leave the leg without compression. #4 I see no evidence of infection Electronic Signature(s) Signed: 05/24/2018 6:15:06 PM By: Linton Ham MD Entered By: Linton Ham on 05/24/2018 10:51:09 [name], [name] ([id number]) -------------------------------------------------------------------------------- SuperBill Details Patient Name: Arquette, [name] Date of Service: 05/24/2018 Medical Record Number: [id number] Patient Account Number: [id number] Date of Birth/Sex: 01-May-1971 (47 y.o. M) Treating RN: Cornell Barman Primary Care Provider: Gaetano Net Other Clinician: Referring Provider: Gaetano Net Treating Provider/Extender: Tito Dine in Treatment: 21 Diagnosis Coding ICD-10 Codes Code Description [id number] Non-pressure chronic ulcer of left calf with necrosis of muscle I89.0 Lymphedema, not elsewhere classified I87.332 Chronic venous hypertension (idiopathic) with ulcer and inflammation of left lower extremity E11.622 Type 2 diabetes mellitus with other skin ulcer Facility Procedures CPT4: Description Modifier Quantity Code [id number] 11042 - DEB SUBQ TISSUE 20 SQ CM/< 1 ICD-10 Diagnosis Description L97.223 Non-pressure chronic ulcer of left calf with necrosis of muscle I89.0 Lymphedema, not elsewhere classified I87.332 Chronic venous  hypertension (idiopathic) with ulcer and inflammation of left lower extremity E11.622 Type 2 diabetes mellitus with  other skin ulcer Physician Procedures CPT4: Description Modifier Quantity Code [id number] - WC PHYS SUBQ TISS 20 SQ CM 1 ICD-10 Diagnosis Description L97.223 Non-pressure chronic ulcer of left calf with necrosis of muscle I89.0 Lymphedema, not elsewhere classified I87.332 Chronic venous  hypertension (idiopathic) with ulcer and inflammation of left lower extremity E11.622 Type 2 diabetes mellitus with other skin ulcer Electronic Signature(s) Signed: 05/24/2018 5:31:05 PM By: Gretta Cool, BSN, RN, CWS, Kim RN, BSN Signed: 05/24/2018 6:15:06 PM By: Linton Ham MD Entered By: Gretta Cool, BSN, RN, CWS, Kim on 05/24/2018 10:08:02

## 2018-05-29 ENCOUNTER — Encounter (INDEPENDENT_AMBULATORY_CARE_PROVIDER_SITE_OTHER): Payer: Self-pay | Admitting: Vascular Surgery

## 2018-05-29 ENCOUNTER — Ambulatory Visit (INDEPENDENT_AMBULATORY_CARE_PROVIDER_SITE_OTHER): Payer: BLUE CROSS/BLUE SHIELD

## 2018-05-29 ENCOUNTER — Ambulatory Visit (INDEPENDENT_AMBULATORY_CARE_PROVIDER_SITE_OTHER): Payer: BLUE CROSS/BLUE SHIELD | Admitting: Vascular Surgery

## 2018-05-29 VITALS — BP 187/67 | HR 60 | Resp 17 | Ht 76.0 in | Wt >= 6400 oz

## 2018-05-29 DIAGNOSIS — L97329 Non-pressure chronic ulcer of left ankle with unspecified severity: Secondary | ICD-10-CM | POA: Insufficient documentation

## 2018-05-29 DIAGNOSIS — E119 Type 2 diabetes mellitus without complications: Secondary | ICD-10-CM

## 2018-05-29 DIAGNOSIS — N186 End stage renal disease: Secondary | ICD-10-CM

## 2018-05-29 DIAGNOSIS — N184 Chronic kidney disease, stage 4 (severe): Secondary | ICD-10-CM

## 2018-05-29 DIAGNOSIS — I129 Hypertensive chronic kidney disease with stage 1 through stage 4 chronic kidney disease, or unspecified chronic kidney disease: Secondary | ICD-10-CM | POA: Diagnosis not present

## 2018-05-29 DIAGNOSIS — E11622 Type 2 diabetes mellitus with other skin ulcer: Secondary | ICD-10-CM | POA: Diagnosis not present

## 2018-05-29 DIAGNOSIS — I872 Venous insufficiency (chronic) (peripheral): Secondary | ICD-10-CM

## 2018-05-29 DIAGNOSIS — E1122 Type 2 diabetes mellitus with diabetic chronic kidney disease: Secondary | ICD-10-CM | POA: Diagnosis not present

## 2018-05-29 DIAGNOSIS — I890 Lymphedema, not elsewhere classified: Secondary | ICD-10-CM

## 2018-05-29 DIAGNOSIS — I83023 Varicose veins of left lower extremity with ulcer of ankle: Secondary | ICD-10-CM | POA: Insufficient documentation

## 2018-05-29 DIAGNOSIS — I10 Essential (primary) hypertension: Secondary | ICD-10-CM

## 2018-05-29 NOTE — Progress Notes (Signed)
MRN : [id number]  [name] is a 47 y.o. ([birth year]) male who presents with chief complaint of  Chief Complaint  Patient presents with  . Follow-up    vein mapping  .  History of Present Illness:   The patient presents today for a new problem.  Dr. [name] his nephrologist feels that his renal function is continuing to deteriorate and that he should be evaluated for a fistula.  He is also following up for his lymphedema with venous ulceration currently being treated with Unna boots.  His  The patient is seen for evaluation for evaluation for new dialysis access. The patient has chronic renal insufficiency stage IV secondary to hypertension and diabetes. The patient's most recent creatinine clearance is about 20. The patient volume status has not yet become an issue. Patient's blood pressures been relatively well controlled. There are mild uremic symptoms which appear to be relatively well tolerated at this time. The patient is right-handed.  The patient has been considering the various methods of dialysis and wishes to proceed with hemodialysis and therefore creation of AV access.  Patient is also being seen for ulceration and swelling of his legs associated with slowly healing ulceration. The patient first noticed the swelling remotely. The swelling is associated with pain and discoloration. The pain and swelling worsens with prolonged dependency and improves with elevation. The pain is unrelated to activity.  The patient notes that in the morning the legs are better but the leg symptoms worsened throughout the course of the day. The patient has also noted a progressive worsening of the discoloration in the ankle and shin area.   The patient notes that an ulcer has developed acutely without specific trauma and since it occurred it has been very slow to heal.  There is a moderate amount of drainage associated with the open area.  The wound is also very painful.  The patient denies  claudication symptoms or rest pain symptoms.  The patient denies DJD and LS spine disease.  The patient has not had any past angiography, interventions or vascular surgery.  Elevation makes the leg symptoms better, dependency makes them much worse. The patient denies any recent changes in medications.  The patient has been wearing Unna boots.  The patient denies a history of DVT or PE. There is no prior history of phlebitis. There is no history of primary lymphedema.  No history of malignancies. No history of trauma or groin or pelvic surgery. There is no history of radiation treatment to the groin or pelvis    Current Meds  Medication Sig  . allopurinol (ZYLOPRIM) 100 MG tablet Take 100 mg by mouth daily.  [name] amLODipine (NORVASC) 10 MG tablet Take by mouth.  [name] atenolol (TENORMIN) 100 MG tablet Take 1 tablet (100 mg total) by mouth daily.  . cloNIDine (CATAPRES) 0.1 MG tablet Take by mouth.  [name] gentamicin cream (GARAMYCIN) 0.1 % Apply 1 application topically 3 (three) times daily.  . hydrALAZINE (APRESOLINE) 25 MG tablet Take 25 mg by mouth 3 (three) times daily.   . Insulin Glargine (LANTUS SOLOSTAR) 100 UNIT/ML Solostar Pen Inject 24 Units into the skin.   [name] torsemide (DEMADEX) 20 MG tablet TAKE 2 TABLETS BY MOUTH EVERY DAY IN THE MORNING    Past Medical History:  Diagnosis Date  . Diabetes mellitus without complication ([place])   . Hypertension   . Microalbuminuria   . Vitamin D deficiency     Past Surgical History:  Procedure Laterality  Date  . APPLICATION OF WOUND VAC Left 10/03/2017   Procedure: APPLICATION OF WOUND VAC;  Surgeon: Algernon Huxley, MD;  Location: [facility];  Service: General;  Laterality: Left;  . APPLICATION OF WOUND VAC Left 10/11/2017   Procedure: APPLICATION OF WOUND VAC;  Surgeon: Katha Cabal, MD;  Location: [facility];  Service: Vascular;  Laterality: Left;  . APPLICATION OF WOUND VAC Left 10/14/2017   Procedure: WOUND VAC CHANGE;  Surgeon: Katha Cabal, MD;  Location: [facility];  Service: Vascular;  Laterality: Left;  left lower leg  . APPLICATION OF WOUND VAC Left 10/18/2017   Procedure: WOUND VAC CHANGE;  Surgeon: Katha Cabal, MD;  Location: [facility];  Service: Vascular;  Laterality: Left;  . APPLICATION OF WOUND VAC Left 10/07/2017   Procedure: APPLICATION OF WOUND VAC;  Surgeon: Katha Cabal, MD;  Location: [facility];  Service: Vascular;  Laterality: Left;  . I&D EXTREMITY Left 10/11/2017   Procedure: IRRIGATION AND DEBRIDEMENT EXTREMITY;  Surgeon: Katha Cabal, MD;  Location: [facility];  Service: Vascular;  Laterality: Left;  . INCISION AND DRAINAGE ABSCESS Right 10/07/2017   Procedure: INCISION AND DRAINAGE ABSCESS;  Surgeon: Katha Cabal, MD;  Location: [facility];  Service: Vascular;  Laterality: Right;  . IRRIGATION AND DEBRIDEMENT ABSCESS Left 10/03/2017   Procedure: IRRIGATION AND DEBRIDEMENT ABSCESS with debridement of skin, soft tissue, muscle 50sq cm;  Surgeon: Algernon Huxley, MD;  Location: [facility];  Service: General;  Laterality: Left;    Social History Social History   Tobacco Use  . Smoking status: Never Smoker  . Smokeless tobacco: Never Used  Substance Use Topics  . Alcohol use: Yes    Comment: beer  . Drug use: Not on file    Family History Family History  Problem Relation Age of Onset  . Diabetes Father   . Kidney disease Father   . Diabetes Daughter     Allergies  Allergen Reactions  . Lisinopril Swelling  . Furosemide Other (See Comments)    Light headed, cough, blurry vision, weakness.     REVIEW OF SYSTEMS (Negative unless checked)  Constitutional: [] Weight loss  [] Fever  [] Chills Cardiac: [] Chest pain   [] Chest pressure   [] Palpitations   [] Shortness of breath when laying flat   [] Shortness of breath with exertion. Vascular:  [] Pain in legs with walking   [] Pain in legs at rest  [] History of DVT   [] Phlebitis   [x] Swelling in legs   [] Varicose veins   [x] Non-healing  ulcers Pulmonary:   [] Uses home oxygen   [] Productive cough   [] Hemoptysis   [] Wheeze  [] COPD   [] Asthma Neurologic:  [] Dizziness   [] Seizures   [] History of stroke   [] History of TIA  [] Aphasia   [] Vissual changes   [] Weakness or numbness in arm   [] Weakness or numbness in leg Musculoskeletal:   [] Joint swelling   [] Joint pain   [] Low back pain Hematologic:  [] Easy bruising  [] Easy bleeding   [] Hypercoagulable state   [] Anemic Gastrointestinal:  [] Diarrhea   [] Vomiting  [] Gastroesophageal reflux/heartburn   [] Difficulty swallowing. Genitourinary:  [] Chronic kidney disease   [] Difficult urination  [] Frequent urination   [] Blood in urine Skin:  [x] Rashes   [x] Ulcers  Psychological:  [] History of anxiety   []  History of major depression.  Physical Examination  Vitals:   05/29/18 1557  BP: (!) 187/67  Pulse: 60  Resp: 17  Weight: (!) 405 lb (183.7 kg)  Height: 6\' 4"  (  1.93 m)   Body mass index is 49.3 kg/m. Gen: WD/WN, NAD Head: [place]/AT, No temporalis wasting.  Ear/Nose/Throat: Hearing grossly intact, nares w/o erythema or drainage Eyes: PER, EOMI, sclera nonicteric.  Neck: Supple, no large masses.   Pulmonary:  Good air movement, no audible wheezing bilaterally, no use of accessory muscles.  Cardiac: RRR, no JVD Vascular: 4+ edema of the left leg with severe venous changes of the left leg.  Venous ulcer noted in the ankle area on the left, noninfected and almost healed (90%) Vessel Right Left  Radial Palpable Palpable  Brachial Palpable Palpable  Carotid Palpable Palpable  Gastrointestinal: Non-distended. No guarding/no peritoneal signs.  Musculoskeletal: M/S 5/5 throughout.  No deformity or atrophy.  Neurologic: CN 2-12 intact. Symmetrical.  Speech is fluent. Motor exam as listed above. Psychiatric: Judgment intact, Mood & affect appropriate for pt's clinical situation. Dermatologic: Venous stasis dermatitis with ulcers present on the left.  No changes consistent with  cellulitis. Lymph : No lichenification or skin changes of chronic lymphedema.  CBC Lab Results  Component Value Date   WBC 5.9 10/19/2017   HGB 8.4 (L) 10/19/2017   HCT 25.6 (L) 10/19/2017   MCV 89.1 10/19/2017   PLT 320 10/19/2017    BMET    Component Value Date/Time   NA 139 10/19/2017 0311   K 4.2 10/19/2017 0311   CL 107 10/19/2017 0311   CO2 24 10/19/2017 0311   GLUCOSE 109 (H) 10/19/2017 0311   BUN 37 (H) 10/19/2017 0311   CREATININE 2.11 (H) 10/19/2017 0311   CALCIUM 8.6 (L) 10/19/2017 0311   GFRNONAA 35 (L) 10/19/2017 0311   GFRAA 41 (L) 10/19/2017 0311   CrCl cannot be calculated (Patient's most recent lab result is older than the maximum 21 days allowed.).  COAG Lab Results  Component Value Date   INR 1.05 10/14/2017   INR 1.16 10/06/2017    Radiology No results found.  Assessment/Plan 1. Chronic renal insufficiency, stage IV (severe) (HCC) Recommend:  At this time the patient does not have appropriate extremity access for dialysis  Patient should have a WaveLinQ fistula created.  The risks, benefits and alternative therapies were reviewed in detail with the patient.  All questions were answered.  The patient agrees to proceed with surgery.    A total of 70 minutes was spent with this patient and greater than 50% was spent in counseling and coordination of care with the patient.  Discussion included the treatment options for vascular disease including indications for surgery and intervention.  Also discussed is the appropriate timing of treatment.  In addition medical therapy was discussed.  2. Venous ulcer of ankle, left ([place]) Nearly healed at this time  I discussed with him that he should begin bringing his compression wraps to [place] appointment because he is almost completely healed.  3. Chronic venous insufficiency No surgery or intervention at this point in time.    I have had a long discussion with the patient regarding venous  insufficiency and why it  causes symptoms. I have discussed with the patient the chronic skin changes that accompany venous insufficiency and the long term sequela such as infection and ulceration.  Patient will begin wearing graduated compression wraps class 1 (20-30 mmHg)  on a daily basis a prescription was given. The patient will put the stockings on first thing in the morning and removing them in the evening. The patient is instructed specifically not to sleep in the stockings.  In addition, behavioral modification including several periods of elevation of the lower extremities during the day will be continued. I have demonstrated that proper elevation is a position with the ankles at heart level.  The patient is instructed to begin routine exercise, especially walking on a daily basis   4. Lymphedema  No surgery or intervention at this point in time.    I have reviewed my discussion with the patient regarding lymphedema and why it  causes symptoms.  Patient will continue wearing graduated compression stockings class 1 (20-30 mmHg) on a daily basis a prescription was given. The patient is reminded to put the stockings on first thing in the morning and removing them in the evening. The patient is instructed specifically not to sleep in the stockings.   In addition, behavioral modification throughout the day will be continued.  This will include frequent elevation (such as in a recliner), use of over the counter pain medications as needed and exercise such as walking.  I have reviewed systemic causes for chronic edema such as liver, kidney and cardiac etiologies and there does not appear to be any significant changes in these organ systems over the past year.  The patient is under the impression that these organ systems are all stable and unchanged.    The patient will continue aggressive use of the  lymph pump.  This will continue to improve the edema control and prevent sequela such as ulcers  and infections.    5. Diabetes mellitus without complication ([place]) Continue hypoglycemic medications as already ordered, these medications have been reviewed and there are no changes at this time.  Hgb A1C to be monitored as already arranged by primary service   6. Essential hypertension Continue antihypertensive medications as already ordered, these medications have been reviewed and there are no changes at this time.     [name], MD  05/29/2018 4:58 PM

## 2018-05-31 ENCOUNTER — Encounter (INDEPENDENT_AMBULATORY_CARE_PROVIDER_SITE_OTHER): Payer: Self-pay | Admitting: Vascular Surgery

## 2018-05-31 ENCOUNTER — Encounter: Payer: BLUE CROSS/BLUE SHIELD | Attending: Internal Medicine | Admitting: Internal Medicine

## 2018-05-31 DIAGNOSIS — E1140 Type 2 diabetes mellitus with diabetic neuropathy, unspecified: Secondary | ICD-10-CM | POA: Diagnosis not present

## 2018-05-31 DIAGNOSIS — E669 Obesity, unspecified: Secondary | ICD-10-CM | POA: Insufficient documentation

## 2018-05-31 DIAGNOSIS — F329 Major depressive disorder, single episode, unspecified: Secondary | ICD-10-CM | POA: Diagnosis not present

## 2018-05-31 DIAGNOSIS — L97211 Non-pressure chronic ulcer of right calf limited to breakdown of skin: Secondary | ICD-10-CM | POA: Diagnosis not present

## 2018-05-31 DIAGNOSIS — I890 Lymphedema, not elsewhere classified: Secondary | ICD-10-CM | POA: Diagnosis not present

## 2018-05-31 DIAGNOSIS — Z6841 Body Mass Index (BMI) 40.0 and over, adult: Secondary | ICD-10-CM | POA: Diagnosis not present

## 2018-05-31 DIAGNOSIS — I129 Hypertensive chronic kidney disease with stage 1 through stage 4 chronic kidney disease, or unspecified chronic kidney disease: Secondary | ICD-10-CM | POA: Diagnosis not present

## 2018-05-31 DIAGNOSIS — N184 Chronic kidney disease, stage 4 (severe): Secondary | ICD-10-CM | POA: Insufficient documentation

## 2018-05-31 DIAGNOSIS — L97223 Non-pressure chronic ulcer of left calf with necrosis of muscle: Secondary | ICD-10-CM | POA: Diagnosis not present

## 2018-05-31 DIAGNOSIS — E11622 Type 2 diabetes mellitus with other skin ulcer: Secondary | ICD-10-CM | POA: Diagnosis not present

## 2018-05-31 DIAGNOSIS — E1122 Type 2 diabetes mellitus with diabetic chronic kidney disease: Secondary | ICD-10-CM | POA: Insufficient documentation

## 2018-06-01 ENCOUNTER — Ambulatory Visit (INDEPENDENT_AMBULATORY_CARE_PROVIDER_SITE_OTHER): Payer: Self-pay | Admitting: Vascular Surgery

## 2018-06-03 NOTE — Progress Notes (Signed)
JOS, CYGAN ([id number]) Visit Report for 05/31/2018 Arrival Information Details Patient Name: [name], [name] Date of Service: 05/31/2018 8:15 AM Medical Record Number: [id number] Patient Account Number: [id number] Date of Birth/Sex: [birth year] (47 y.o. M) Treating RN: Cornell Barman Primary Care Charls Custer: Gaetano Net Other Clinician: Referring Elvis Laufer: Gaetano Net Treating Elizzie Westergard/Extender: Tito Dine in Treatment: 102 Visit Information History Since Last Visit Added or deleted any medications: No Patient Arrived: Ambulatory Any new allergies or adverse reactions: No Arrival Time: 08:08 Had a fall or experienced change in No Accompanied By: self activities of daily living that may affect Transfer Assistance: None risk of falls: Patient Identification Verified: Yes Signs or symptoms of abuse/neglect since last visito No Secondary Verification Process Yes Hospitalized since last visit: No Completed: Implantable device outside of the clinic excluding No Patient Requires Transmission-Based No cellular tissue based products placed in the center Precautions: since last visit: Patient Has Alerts: Yes Has Dressing in Place as Prescribed: Yes Patient Alerts: DM II Pain Present Now: No noncompressible bilateral Electronic Signature(s) Signed: 05/31/2018 1:31:10 PM By: Lorine Bears RCP, RRT, CHT Entered By: Lorine Bears on 05/31/2018 08:08:59 [name], [name] ([id number]) -------------------------------------------------------------------------------- Compression Therapy Details Patient Name: [name], [name] Date of Service: 05/31/2018 8:15 AM Medical Record Number: [id number] Patient Account Number: [id number] Date of Birth/Sex: December 23, 1970 (47 y.o. M) Treating RN: Cornell Barman Primary Care Gaylon Bentz: Gaetano Net Other Clinician: Referring Jawara Latorre: Gaetano Net Treating Taralyn Ferraiolo/Extender: Tito Dine in Treatment:  22 Compression Therapy Performed for Wound Assessment: Wound #15 Left,Proximal Lower Leg Performed By: Clinician Cornell Barman, RN Compression Type: Four Layer Post Procedure Diagnosis Same as Pre-procedure Electronic Signature(s) Signed: 06/01/2018 3:05:38 PM By: Gretta Cool, BSN, RN, CWS, Kim RN, BSN Entered By: Gretta Cool, BSN, RN, CWS, Kim on 05/31/2018 08:42:09 [name], [name] Kitchen ([id number]) -------------------------------------------------------------------------------- Compression Therapy Details Patient Name: [name], [name] Date of Service: 05/31/2018 8:15 AM Medical Record Number: [id number] Patient Account Number: [id number] Date of Birth/Sex: 07/29/1971 (47 y.o. M) Treating RN: Cornell Barman Primary Care Rihan Schueler: Gaetano Net Other Clinician: Referring Torrell Krutz: Gaetano Net Treating Zacharias Ridling/Extender: Tito Dine in Treatment: 22 Compression Therapy Performed for Wound Assessment: Wound #16 Right,Posterior Lower Leg Performed By: Clinician Cornell Barman, RN Compression Type: Four Layer Post Procedure Diagnosis Same as Pre-procedure Electronic Signature(s) Signed: 06/01/2018 3:05:38 PM By: Gretta Cool, BSN, RN, CWS, Kim RN, BSN Entered By: Gretta Cool, BSN, RN, CWS, Kim on 05/31/2018 08:42:10 [name] ([id number]) -------------------------------------------------------------------------------- Encounter Discharge Information Details Patient Name: [name], [name] Date of Service: 05/31/2018 8:15 AM Medical Record Number: [id number] Patient Account Number: [id number] Date of Birth/Sex: 05-Nov-1970 (47 y.o. M) Treating RN: Cornell Barman Primary Care Britt Petroni: Gaetano Net Other Clinician: Referring Arcenia Scarbro: Gaetano Net Treating Farida Mcreynolds/Extender: Tito Dine in Treatment: 22 Encounter Discharge Information Items Discharge Condition: Stable Ambulatory Status: Ambulatory Discharge Destination: Home Transportation: Private Auto Schedule Follow-up Appointment: Yes Clinical  Summary of Care: Electronic Signature(s) Signed: 06/01/2018 3:05:38 PM By: Gretta Cool, BSN, RN, CWS, Kim RN, BSN Entered By: Gretta Cool, BSN, RN, CWS, Kim on 05/31/2018 09:08:05 [name] ([id number]) -------------------------------------------------------------------------------- Lower Extremity Assessment Details Patient Name: [name], [name] Date of Service: 05/31/2018 8:15 AM Medical Record Number: [id number] Patient Account Number: [id number] Date of Birth/Sex: 1971/05/28 (47 y.o. M) Treating RN: Roger Shelter Primary Care Harryette Shuart: Gaetano Net Other Clinician: Referring Jerre Diguglielmo: Gaetano Net Treating [name]/Extender: Tito Dine in Treatment: 22 Edema Assessment Assessed: [Left: No] [Right: No] Edema: [Left: Yes] [Right: Yes] Calf Left: Right:  Point of Measurement: 36 cm From Medial Instep 68 cm 50 cm Ankle Left: Right: Point of Measurement: 14 cm From Medial Instep 52 cm 37.3 cm Vascular Assessment Claudication: Claudication Assessment [Left:None] [Right:None] Pulses: Dorsalis Pedis Palpable: [Left:Yes] [Right:Yes] Posterior Tibial Extremity colors, hair growth, and conditions: Extremity Color: [Left:Hyperpigmented] [Right:Hyperpigmented] Hair Growth on Extremity: [Left:Yes] [Right:Yes] Temperature of Extremity: [Left:Warm] [Right:Warm] Capillary Refill: [Left:< 3 seconds] [Right:< 3 seconds] Toe Nail Assessment Left: Right: Thick: Yes Yes Discolored: Yes Yes Deformed: No No Improper Length and Hygiene: Yes Yes Electronic Signature(s) Signed: 05/31/2018 4:23:20 PM By: Roger Shelter Entered By: Roger Shelter on 05/31/2018 08:31:21 [name], [name] ([id number]) -------------------------------------------------------------------------------- Multi Wound Chart Details Patient Name: [name], [name] Date of Service: 05/31/2018 8:15 AM Medical Record Number: [id number] Patient Account Number: [id number] Date of Birth/Sex: [birth year] (47 y.o.  M) Treating RN: Cornell Barman Primary Care Kyliee Ortego: Gaetano Net Other Clinician: Referring Susie Pousson: Gaetano Net Treating [name] Rossetti/Extender: Tito Dine in Treatment: 22 Vital Signs Height(in): 76 Pulse(bpm): 60 Weight(lbs): 401.1 Blood Pressure(mmHg): 173/77 Body Mass Index(BMI): 49 Temperature(F): 98.2 Respiratory Rate 18 (breaths/min): Photos: [15:No Photos] [16:No Photos] [2:No Photos] Wound Location: [15:Left, Proximal Lower Leg] [16:Right, Posterior Lower Leg] [2:Left, Lateral, Posterior Lower Leg] Wounding Event: [15:Gradually Appeared] [16:Gradually Appeared] [2:Gradually Appeared] Primary Etiology: [15:Lymphedema] [16:Lymphedema] [2:Diabetic Wound/Ulcer of the Lower Extremity] Secondary Etiology: [15:N/A] [16:N/A] [2:Lymphedema] Date Acquired: [15:05/24/2018] [16:05/20/2018] [2:09/30/2017] Weeks of Treatment: [15:1] [16:1] [2:22] Wound Status: [15:Open] [16:Open] [2:Open] Measurements L x W x D [15:0.2x0.2x0.1] [16:8x6.8x0.1] [2:1.4x4x0.3] (cm) Area (cm) : [15:0.031] [16:42.726] [1:7.616] Volume (cm) : [07:3.710] [16:4.273] [2:1.319] % Reduction in Area: [15:98.70%] [16:-39.50%] [2:84.40%] % Reduction in Volume: [15:98.70%] [16:-39.50%] [2:88.30%] Classification: [15:Partial Thickness] [16:Full Thickness Without Exposed Support Structures] [2:Grade 2] Periwound Skin Texture: [15:No Abnormalities Noted] [16:No Abnormalities Noted] [2:No Abnormalities Noted] Periwound Skin Moisture: [15:No Abnormalities Noted] [16:No Abnormalities Noted] [2:No Abnormalities Noted] Periwound Skin Color: [15:No Abnormalities Noted] [16:No Abnormalities Noted] [2:No Abnormalities Noted] Tenderness on Palpation: [15:No Compression Therapy] [16:No Compression Therapy] [2:No N/A] Treatment Notes Electronic Signature(s) Signed: 06/01/2018 5:56:52 AM By: Linton Ham MD Entered By: Linton Ham on 05/31/2018 08:47:05 [name], [name] Kitchen  ([id number]) -------------------------------------------------------------------------------- Multi-Disciplinary Care Plan Details Patient Name: [name], [name] Date of Service: 05/31/2018 8:15 AM Medical Record Number: [id number] Patient Account Number: [id number] Date of Birth/Sex: 21-Jan-1971 (47 y.o. M) Treating RN: Cornell Barman Primary Care Jacoba Cherney: Gaetano Net Other Clinician: Referring Kyrstin Campillo: Gaetano Net Treating Jaquavion Mccannon/Extender: Tito Dine in Treatment: 22 Active Inactive ` Abuse / Safety / Falls / Self Care Management Nursing Diagnoses: Potential for falls Goals: Patient will remain injury free related to falls Date Initiated: 12/28/2017 Target Resolution Date: 04/08/2018 Goal Status: Active Interventions: Assess fall risk on admission and as needed Notes: ` Nutrition Nursing Diagnoses: Impaired glucose control: actual or potential Goals: Patient/caregiver verbalizes understanding of need to maintain therapeutic glucose control per primary care physician Date Initiated: 12/28/2017 Target Resolution Date: 04/08/2018 Goal Status: Active Interventions: Provide education on elevated blood sugars and impact on wound healing Notes: ` Orientation to the Wound Care Program Nursing Diagnoses: Knowledge deficit related to the wound healing center program Goals: Patient/caregiver will verbalize understanding of the [facility] Date Initiated: 12/28/2017 Target Resolution Date: 04/08/2018 Goal Status: Active Interventions: Meisenheimer, Rett A. ([id number]) Provide education on orientation to the wound center Notes: ` Venous Leg Ulcer Nursing Diagnoses: Knowledge deficit related to disease process and management Goals: Patient will maintain optimal edema control Date Initiated: 03/27/2018 Target Resolution Date: 04/27/2018 Goal Status: Active  Interventions: Assess peripheral edema status every visit. Compression as ordered Treatment  Activities: Therapeutic compression applied : 03/22/2018 Notes: ` Wound/Skin Impairment Nursing Diagnoses: Impaired tissue integrity Goals: Ulcer/skin breakdown will heal within 14 weeks Date Initiated: 12/28/2017 Target Resolution Date: 04/08/2018 Goal Status: Active Interventions: Assess patient/caregiver ability to obtain necessary supplies Assess patient/caregiver ability to perform ulcer/skin care regimen upon admission and as needed Assess ulceration(s) every visit Notes: Electronic Signature(s) Signed: 06/01/2018 3:05:38 PM By: Gretta Cool, BSN, RN, CWS, Kim RN, BSN Entered By: Gretta Cool, BSN, RN, CWS, Kim on 05/31/2018 08:39:35 [name], [name] ([id number]) -------------------------------------------------------------------------------- Pain Assessment Details Patient Name: [name], [name] Date of Service: 05/31/2018 8:15 AM Medical Record Number: [id number] Patient Account Number: [id number] Date of Birth/Sex: April 21, 1971 (47 y.o. M) Treating RN: Cornell Barman Primary Care Timur Nibert: Gaetano Net Other Clinician: Referring Tammey Deeg: Gaetano Net Treating Nuri Larmer/Extender: Tito Dine in Treatment: 22 Active Problems Location of Pain Severity and Description of Pain Patient Has Paino No Site Locations Pain Management and Medication Current Pain Management: Electronic Signature(s) Signed: 05/31/2018 1:31:10 PM By: Lorine Bears RCP, RRT, CHT Signed: 06/01/2018 3:05:38 PM By: Gretta Cool, BSN, RN, CWS, Kim RN, BSN Entered By: Lorine Bears on 05/31/2018 08:09:06 [name], [name] ([id number]) -------------------------------------------------------------------------------- Patient/Caregiver Education Details Patient Name: [name], [name] Date of Service: 05/31/2018 8:15 AM Medical Record Number: [id number] Patient Account Number: [id number] Date of Birth/Gender: 1971/06/07 (47 y.o. M) Treating RN: Cornell Barman Primary Care Physician: Gaetano Net Other  Clinician: Referring Physician: Gaetano Net Treating Physician/Extender: Tito Dine in Treatment: 22 Education Assessment Education Provided To: Patient Education Topics Provided Elevated Blood Sugar/ Impact on Healing: Handouts: Elevated Blood Sugars: How Do They Affect Wound Healing Methods: Demonstration, Explain/Verbal Responses: State content correctly Venous: Handouts: Controlling Swelling with Compression Stockings Methods: Demonstration, Explain/Verbal Responses: State content correctly Electronic Signature(s) Signed: 06/01/2018 3:05:38 PM By: Gretta Cool, BSN, RN, CWS, Kim RN, BSN Entered By: Gretta Cool, BSN, RN, CWS, Kim on 05/31/2018 09:08:09 [name], [name] ([id number]) -------------------------------------------------------------------------------- Wound Assessment Details Patient Name: Tschetter, Tandre A. Date of Service: 05/31/2018 8:15 AM Medical Record Number: [id number] Patient Account Number: [id number] Date of Birth/Sex: 12-Sep-1970 (47 y.o. M) Treating RN: Roger Shelter Primary Care Tyre Beaver: Gaetano Net Other Clinician: Referring Mega Kinkade: Gaetano Net Treating Mercedes [name]/Extender: Tito Dine in Treatment: 22 Wound Status Wound Number: 15 Primary Etiology: Lymphedema Wound Location: Left, Proximal Lower Leg Wound Status: Open Wounding Event: Gradually Appeared Date Acquired: 05/24/2018 Weeks Of Treatment: 1 Clustered Wound: No Photos Photo Uploaded By: Roger Shelter on 05/31/2018 13:42:54 Wound Measurements Length: (cm) 0.2 Width: (cm) 0.2 Depth: (cm) 0.1 Area: (cm) 0.031 Volume: (cm) 0.003 % Reduction in Area: 98.7% % Reduction in Volume: 98.7% Wound Description Classification: Partial Thickness Periwound Skin Texture Texture Color No Abnormalities Noted: No No Abnormalities Noted: No Moisture No Abnormalities Noted: No Treatment Notes Wound #15 (Left, Proximal Lower Leg) 1. Cleansed with: Clean wound with Normal  Saline 2. Anesthetic Topical Lidocaine 4% cream to wound bed prior to debridement 3. Peri-wound Care: Yasuda, Nickalus A. ([id number]) Moisturizing lotion 4. Dressing Applied: Other dressing (specify in notes) 7. Secured with 4 Layer Compression System - Bilateral Notes silvercell, xtrasorb Electronic Signature(s) Signed: 05/31/2018 4:23:20 PM By: Roger Shelter Entered By: Roger Shelter on 05/31/2018 08:27:52 Nordgren, Yonah A. ([id number]) -------------------------------------------------------------------------------- Wound Assessment Details Patient Name: Corso, Rayburn A. Date of Service: 05/31/2018 8:15 AM Medical Record Number: [id number] Patient Account Number: [id number] Date of Birth/Sex: 10-Sep-1970 (47 y.o. M) Treating RN: Roger Shelter Primary Care  Graycen Sadlon: Gaetano Net Other Clinician: Referring Emorie Mcfate: Gaetano Net Treating Jannifer Fischler/Extender: Tito Dine in Treatment: 22 Wound Status Wound Number: 16 Primary Etiology: Lymphedema Wound Location: Right, Posterior Lower Leg Wound Status: Open Wounding Event: Gradually Appeared Date Acquired: 05/20/2018 Weeks Of Treatment: 1 Clustered Wound: No Photos Photo Uploaded By: Roger Shelter on 05/31/2018 13:42:55 Wound Measurements Length: (cm) 8 Width: (cm) 6.8 Depth: (cm) 0.1 Area: (cm) 42.726 Volume: (cm) 4.273 % Reduction in Area: -39.5% % Reduction in Volume: -39.5% Wound Description Full Thickness Without Exposed Support Classification: Structures Periwound Skin Texture Texture Color No Abnormalities Noted: No No Abnormalities Noted: No Moisture No Abnormalities Noted: No Treatment Notes Wound #16 (Right, Posterior Lower Leg) 1. Cleansed with: Clean wound with Normal Saline 2. Anesthetic Topical Lidocaine 4% cream to wound bed prior to debridement Seckel, Carole A. ([id number]) 3. Peri-wound Care: Moisturizing lotion 4. Dressing Applied: Other dressing (specify in notes) 7.  Secured with 4 Layer Compression System - Bilateral Notes silvercell, xtrasorb Electronic Signature(s) Signed: 05/31/2018 4:23:20 PM By: Roger Shelter Entered By: Roger Shelter on 05/31/2018 08:25:51 Guterrez, Loras A. ([id number]) -------------------------------------------------------------------------------- Wound Assessment Details Patient Name: Borgwardt, Johntay A. Date of Service: 05/31/2018 8:15 AM Medical Record Number: [id number] Patient Account Number: [id number] Date of Birth/Sex: Sep 28, 1970 (47 y.o. M) Treating RN: Roger Shelter Primary Care Shanikka Wonders: Gaetano Net Other Clinician: Referring Amarrah Meinhart: Gaetano Net Treating Alenah Sarria/Extender: Tito Dine in Treatment: 22 Wound Status Wound Number: 2 Primary Etiology: Diabetic Wound/Ulcer of the Lower Extremity Wound Location: Left, Lateral, Posterior Lower Leg Secondary Lymphedema Wounding Event: Gradually Appeared Etiology: Date Acquired: 09/30/2017 Wound Status: Open Weeks Of Treatment: 22 Clustered Wound: No Photos Photo Uploaded By: Roger Shelter on 05/31/2018 13:43:13 Wound Measurements Length: (cm) 1.4 Width: (cm) 4 Depth: (cm) 0.3 Area: (cm) 4.398 Volume: (cm) 1.319 % Reduction in Area: 84.4% % Reduction in Volume: 88.3% Wound Description Classification: Grade 2 Periwound Skin Texture Texture Color No Abnormalities Noted: No No Abnormalities Noted: No Moisture No Abnormalities Noted: No Treatment Notes Wound #2 (Left, Lateral, Posterior Lower Leg) 1. Cleansed with: Clean wound with Normal Saline 2. Anesthetic Topical Lidocaine 4% cream to wound bed prior to debridement 3. Peri-wound Care: Goyal, Shivan A. ([id number]) Moisturizing lotion 4. Dressing Applied: Other dressing (specify in notes) 7. Secured with 4 Layer Compression System - Bilateral Notes silvercell, xtrasorb Electronic Signature(s) Signed: 05/31/2018 4:23:20 PM By: Roger Shelter Entered By: Roger Shelter on 05/31/2018 08:27:52 Girgenti, Tysen A. ([id number]) -------------------------------------------------------------------------------- Vitals Details Patient Name: Messimer, Kaimani A. Date of Service: 05/31/2018 8:15 AM Medical Record Number: [id number] Patient Account Number: [id number] Date of Birth/Sex: 05/05/1971 (47 y.o. M) Treating RN: Cornell Barman Primary Care Gwendelyn Lanting: Gaetano Net Other Clinician: Referring Ryna Beckstrom: Gaetano Net Treating Cyanne Delmar/Extender: Tito Dine in Treatment: 22 Vital Signs Time Taken: 08:08 Temperature (F): 98.2 Height (in): 76 Pulse (bpm): 60 Weight (lbs): 401.1 Respiratory Rate (breaths/min): 18 Body Mass Index (BMI): 48.8 Blood Pressure (mmHg): 173/77 Reference Range: 80 - 120 mg / dl Electronic Signature(s) Signed: 05/31/2018 1:31:10 PM By: Lorine Bears RCP, RRT, CHT Entered By: Lorine Bears on 05/31/2018 08:12:22

## 2018-06-03 NOTE — Progress Notes (Signed)
[name] ([id number]) Visit Report for 05/31/2018 Chief Complaint Document Details Patient Name: [name], [name] Date of Service: 05/31/2018 8:15 AM Medical Record Number: [id number] Patient Account Number: [id number] Date of Birth/Sex: May 30, 1971 (47 y.o. M) Treating RN: Cornell Barman Primary Care Provider: Gaetano Net Other Clinician: Referring Provider: Gaetano Net Treating Provider/Extender: Tito Dine in Treatment: 22 Information Obtained from: Patient Chief Complaint 12/28/17; patient is here for review of 2 surgical wounds on the left calf Electronic Signature(s) Signed: 05/31/2018 8:03:16 PM By: Worthy Keeler PA-C Signed: 06/01/2018 5:56:52 AM By: Linton Ham MD Entered By: Worthy Keeler on 05/31/2018 08:26:19 Olarte, Allon A. ([id number]) -------------------------------------------------------------------------------- HPI Details Patient Name: Radice, Demaree A. Date of Service: 05/31/2018 8:15 AM Medical Record Number: [id number] Patient Account Number: [id number] Date of Birth/Sex: Dec 08, 1970 (47 y.o. M) Treating RN: Cornell Barman Primary Care Provider: Gaetano Net Other Clinician: Referring Provider: Gaetano Net Treating Provider/Extender: Tito Dine in Treatment: 22 History of Present Illness HPI Description: 12/28/17; this is a now 47 year old man who is a type II diabetic. He was hospitalized from 10/01/17 through 10/19/17. He had an MSSA soft tissue and skin infection. 2 open areas on the left leg were identified he has a smaller area on the left medial calf superiorly just below the knee and a wound just above the left ankle on the posterior medial aspect. I think both of these were surgical IandD sites when he was in the hospital. He was discharged with a wound VAC at that point however this is since been taken off. He follows with Dr. Ola Spurr for the [facility] and he is still on chronic Keflex at 500 twice a day. At that time he was  hospitalized his hemoglobin A1c was 15.1 however if I'm reading his endocrinologist notes correctly that is improved. He has been following with Dr. [name] at vein [facility] vascular and he has been applying calcium alginate and Unna boots. He has home health changing the dressing. They have also been attempting to get him external compression pumps although the patient is unaware whether they've been approved by insurance at this point. as mentioned he has a smaller clean wound on the right lateral calf just below the knee and he has a much larger area just above the left ankle medially and posteriorly. Our intake nurse reported greenish purulent looking drainage.the patient did have surgical material sent to pathology in February. This showed chronic abscess The patient also has lymphedema stage III in the left greater than right lower extremities. He has a history of blisters with wounds but these of all were always healed. The patient thinks that the lymphedema may have been present since he was about 47 years old i.e. about 30 years. He does not have graded pressure stockings and has not worn stockings. He does not have a distant history of DVT PE or phlebitis. He has not been systemically unwell fever no chills. He states that his Lasix is recently been reduced. He tells me his kidney function is at "30%" and he has been followed by Dr. [name] of nephrology. At one point he was on Lasix 80 twice a day however that's been cut back and he is now on Lasix at 20 twice a day. The patient has a history of PAD listed in his records although he comes from Dr. [name] I don't think is felt to have significant PAD. ABIs in our clinic were noncompressible bilaterally. 01/04/18; patient has a large wound on the  left lateral lower calf and a small wound on the left medial upper calf. He has been to see his nephrologist who changed him to Demadex 40 mg a day. I'm hopeful this will help with his systemic fluid  overload. He also has stage III lymphedema. Really no change in the 2 wounds since last week 01/11/18; the patient is down 13 pounds. He put his stage III lymphedema left leg in 4K compression last week and there is less edema fluid however we still haven't been able to communicate with home health but [facility] it is [facility] but the dressings have not been changed. The patient noted an odor last week. He is also had compression pumps ordered by [facility] this as a not completed the paperwork stage. 01/18/18; patient continues to lose weight. Stage III lymphedema in the left greater than right leg under for alert compression. The major wound is on the left lateral ankle area. He apparently has bilateral compression pumps being brought to his house, these were ordered by [name] Notable for the fact today he had some blisters on the right anterior leg together with some skin nodules. This is no doubt secondary to severe lymphedema. 01/25/18; the patient has obtained his compression pumps and is using them per vein and vascular instructions 3 times a day for an hour. He also saw [name] of vein and vascular. He was felt to have venous insufficiency but did not suggest any intervention also improved edema. It was suggested that he have compression stockings 20-30 mm on a daily basis in addition to compression pumps. The patient arrives in clinic today with a layer of unna under for layer compression. he seems to have some trouble with the degree of compression. He has open areas on the left lateral ankle area which is his major wound left upper medial calf and a superficial open area on the right anterior shin area which was blistered last week. He has skin changes on the right anterior Westby, Andie A. ([id number]) calf which I think are no doubt secondary to lymphedema skin nodules etc. 02/01/18; the patient comes in telling us his nephrologist have to his torsemide.  Unfortunately today he is put on 7 pounds by our scales. He has blisters all over the anterior and medial part of his right calf and a new open wound. He also has soupy green drainage coming out of the left lateral calf /ankle wound. 02/08/18; culture I did last week of the left lateral ankle wound grew both Pseudomonas and Morganella. He is on Keflex from Dr. Ola Spurr in the hospital. I will need to review these notes.in any case Keflex is not going to cover these 2 organisms. I'm probably going to added ciprofloxacin today for 1 week. A lot of drainage that looks purulent last week. He is not complaining of pain however he has managed to put on 10 pounds in 2 weeks by our scales in this clinic. He is going to see his nephrologist tomorrow 02/15/18; he completed the ciprofloxacin I gave him last week. Notable that he is up to 379 pounds today which is up 16 pounds from 2 weeks ago. He is complaining of orthopnea but doesn't have any chest pain. 02/22/18; he continues to have weight gain. R intake nurse reports again purulent green drainage coming out of the lateral wound on the lateral left calf. He has small open area on the right anterior leg. His torsemide was increased to 2 tablets a  day I believe this is 40 mg last week in response to the call admitted to Dr. Keturah Barre office. He follows up with Dr. [name] and Dr. Delana Meyer tomorrow 03/01/18 his weight essentially stable today at 383 pounds. Drainage out of the left lateral wound on the lower left calf/ankle is a lot less. Culture last time grew Pseudomonas. I put him on cefdinir. He has been to Dr. Keturah Barre office no adjustments in his diuretics. Dr. Nicoletta Dress prescribed a wraparound stocking for the right leg.there is no open area on the right leg. He has a superficial area on the left medial calf, left posterior calf and in the large area on the left lateral however this looks better 03/15/18; weight is not up to 393 pounds. He saw his nephrologist  yesterday Dr. [name] will increase the Demadex I'm hopeful this will help with the edema control. I'm using silver alginate to all his wounds. In particular the left lateral ankle looks better. oUnfortunately he has new open areas on the right lateral calf that will include use of his compression stocking at least in the short to medium term. He has new wounds o3 on the right lateral calf. One of these has some size however all numerous superficial 03/22/18; his weight is stabilized a bit. Just adjustment of his diuretics by his nephrologist. There is no doubt he has some degree of systemic fluid overload on top of severe left greater than right lymphedema. 2 weeks ago tried to transition him to stockings on the right leg however he developed re-breakdown of skin on the right leg and we had to put him back in compression last week. He also uses external compression pumps and claims to be compliant Continued concern about his depression today 03/29/18; several ongoing issues with this patient; oHe no longer has home health coverage apparently secondary to a lapse in insurance. He is apparently transitioning from short for long-term disability. [facility] was changing his compression wraps on Monday and Friday. oHe has gained 10 pounds since last week oSees Dr. [name] tomorrow oSaw his primary doctor last week about the depression. It sounds as though he declined pharmacologic management. He seems somewhat better today. We were concerned last week when he came. Somewhat better today oHe is using his compression pumps once a day at home, I have asked for twice a day if possible especially on the left leg oFollows up with his nephrologist in mid-August. He is managing his diuretic for I think stage IV chronic renal failure oParadoxically his wounds actually look better 04/19/18; the patient has not been seen since I last saw him 3 weeks ago. He saw Dr. Delana Meyer of vascular surgery on 03/30/18  I believe he put him in a 20/30 stocking bilaterally with a wraparound extremitease stocking. He has not been putting anything specifically on the wound. More problematic than that he has not been wearing the stockings he is at home. He has been using his external compression pumps once per day according to him on a rare occasion twice On a psychosocial level the patient is now on long-term disability and is applying for COBRA therefore he is between insurances. He has not been able to follow up with Dr. [name] who is his nephrologist as a result. As noted his weight is up to 407 pounds today. He promises me he'll follow-up with Dr. [name] 05/03/18; he hasn't been here in 2 weeks now. He apparently has been wearing a compression sock on the left leg. Massive  increase in edema 3 large open wounds on the left anterior leg that were probably blisters. Significant deterioration in the left lateral ankle wound that we've been doing as his most problematic wound. He still does not have his insurance issues wrapped up. His weight is well over 400 pounds. 05/10/18; arrives today with better looking edema control in the left leg. He has been using his compression pumps twice a day. We also wrapped it is left leg for there he's been using his pumps so there is much better edema control. Most of new wounds from last week look a lot better. Even the refractory area on the lower left lateral ankle looks a lot better to me today. He follows up with Dr. [name] this morning [nephrology] 05/17/18 patient arrives today with a lot less edema in the left leg. His weight is gone down 7 pounds. He tells me he saw Dr. [name] but his torsemide was not adjusted. He is using the palms 3 times a day. There is been quite an improvement in the Harlacher, Elye A. ([id number]) remaining wound on the left lateral ankle oWeekly visit for follow-up of bilateral lower extremity wounds related to severe lymphedema and probably some degree  of systemic fluid overload from chronic renal failure stage IV. His weight is up this week to over 400 pounds. He noted increasing edema in the right leg late last week. He took his compression stocking off he did not increase the frequency of this compression pump use. He developed a large blister on the back of the right calf. He also has a new opened blister on the left lateral calf in addition to the wound that we've been using on the distal left lower calf area and we've been using silver alginate. He tells me that he has an ultrasound which is a DVT rule out and I think a reflux study ordered by Dr. [name] 05/31/18; weekly visit. He went to see main and vascular this week apparently they remove the 4-layer compression on the left and put an Unna boot on him in replacement. He's got more swelling in the left leg is resolved. That being said his left lower Wound is better. He still has a fairly large wound on the right posterior calf this was not disturbed. He has a follow-up appointment with Dr. Keturah Barre nephrologist next week Electronic Signature(s) Signed: 06/01/2018 5:56:52 AM By: Linton Ham MD Entered By: Linton Ham on 05/31/2018 08:48:32 Ferrera, Karon AMarland Kitchen ([id number]) -------------------------------------------------------------------------------- Physical Exam Details Patient Name: Osbourn, Nicolus A. Date of Service: 05/31/2018 8:15 AM Medical Record Number: [id number] Patient Account Number: [id number] Date of Birth/Sex: 1971-06-29 (47 y.o. M) Treating RN: Cornell Barman Primary Care Provider: Gaetano Net Other Clinician: Referring Provider: Gaetano Net Treating Provider/Extender: Tito Dine in Treatment: 90 Constitutional Patient is hypertensive.. Pulse regular and within target range for patient.[name] Respirations regular, non-labored and within target range.. Temperature is normal and within the target range for the patient.[name] appears in no  distress. Eyes Conjunctivae clear. No discharge. Cardiovascular JVP is borderline elevated. He has no S3. Pedal pulses palpable and strong bilaterally.. Edema present in both extremities.This is mostly lymphedema but he has also has a pitting component bilaterally. Gastrointestinal (GI) Obese but not distended. I don't believe there is ascites. Integumentary (Hair, Skin) Lymphedema of the left leg greater than the right. Psychiatric No evidence of depression, anxiety, or agitation. Calm, cooperative, and communicative. Appropriate interactions and affect.. Notes Wound exam oThe patient's wound that we have been  following his on the left lateral lower calf. This is just about closed still some macerated areas. I thought this needed to be redressed next week. oOn the right posterior calf he has a fairly sizable reminiscent of a blister this as a clean base. No debridement is required. Electronic Signature(s) Signed: 06/01/2018 5:56:52 AM By: Linton Ham MD Entered By: Linton Ham on 05/31/2018 08:51:05 Swindle, Jeris AMarland Kitchen ([id number]) -------------------------------------------------------------------------------- Physician Orders Details Patient Name: Hufstetler, Fidencio A. Date of Service: 05/31/2018 8:15 AM Medical Record Number: [id number] Patient Account Number: [id number] Date of Birth/Sex: 06-Dec-1970 (47 y.o. M) Treating RN: Cornell Barman Primary Care Provider: Gaetano Net Other Clinician: Referring Provider: Gaetano Net Treating Provider/Extender: Tito Dine in Treatment: 29 Verbal / Phone Orders: No Diagnosis Coding ICD-10 Coding Code Description [phone] Non-pressure chronic ulcer of left calf with necrosis of muscle I89.0 Lymphedema, not elsewhere classified I87.332 Chronic venous hypertension (idiopathic) with ulcer and inflammation of left lower extremity E11.622 Type 2 diabetes mellitus with other skin ulcer L97.211 Non-pressure chronic ulcer of right calf  limited to breakdown of skin Wound Cleansing Wound #15 Left,Proximal Lower Leg o Clean wound with Normal Saline. o Cleanse wound with mild soap and water Wound #16 Right,Posterior Lower Leg o Clean wound with Normal Saline. o Cleanse wound with mild soap and water Wound #2 Left,Lateral,Posterior Lower Leg o Clean wound with Normal Saline. o Cleanse wound with mild soap and water Primary Wound Dressing Wound #15 Left,Proximal Lower Leg o Silver Alginate Wound #16 Right,Posterior Lower Leg o Silver Alginate Wound #2 Left,Lateral,Posterior Lower Leg o Silver Alginate Secondary Dressing Wound #15 Left,Proximal Lower Leg o Other - xsorb Wound #16 Right,Posterior Lower Leg o Other - xsorb Wound #2 Left,Lateral,Posterior Lower Leg o Other - xsorb Dressing Change Frequency Benton, Yaziel A. ([id number]) Wound #15 Left,Proximal Lower Leg o Change dressing every week Wound #16 Right,Posterior Lower Leg o Change dressing every week Wound #2 Left,Lateral,Posterior Lower Leg o Change dressing every week Follow-up Appointments Wound #15 Left,Proximal Lower Leg o Return Appointment in 1 week. Wound #16 Right,Posterior Lower Leg o Return Appointment in 1 week. Wound #2 Left,Lateral,Posterior Lower Leg o Return Appointment in 1 week. Edema Control Wound #15 Left,Proximal Lower Leg o 4 Layer Compression System - Bilateral o Compression Pump: Use compression pump on left lower extremity for 30 minutes, twice daily. - 1 Hour twice Daily o Compression Pump: Use compression pump on right lower extremity for 30 minutes, twice daily. - 1 Hour twice daily Wound #16 Right,Posterior Lower Leg o 4 Layer Compression System - Bilateral o Compression Pump: Use compression pump on left lower extremity for 30 minutes, twice daily. - 1 Hour twice Daily o Compression Pump: Use compression pump on right lower extremity for 30 minutes, twice daily. - 1 Hour  twice daily Wound #2 Left,Lateral,Posterior Lower Leg o 4 Layer Compression System - Bilateral o Compression Pump: Use compression pump on left lower extremity for 30 minutes, twice daily. - 1 Hour twice Daily o Compression Pump: Use compression pump on right lower extremity for 30 minutes, twice daily. - 1 Hour twice daily Electronic Signature(s) Signed: 06/01/2018 5:56:52 AM By: Linton Ham MD Signed: 06/01/2018 3:05:38 PM By: Gretta Cool, BSN, RN, CWS, Kim RN, BSN Entered By: Gretta Cool, BSN, RN, CWS, Kim on 05/31/2018 08:43:00 Pelster, Devaun AMarland Kitchen ([id number]) -------------------------------------------------------------------------------- Problem List Details Patient Name: Balles, Izeyah A. Date of Service: 05/31/2018 8:15 AM Medical Record Number: [id number] Patient Account Number: [id number] Date of Birth/Sex: 1970-10-07 (47 y.o. M) Treating RN:  Cornell Barman Primary Care Provider: Gaetano Net Other Clinician: Referring Provider: Gaetano Net Treating Provider/Extender: Tito Dine in Treatment: 22 Active Problems ICD-10 Evaluated Encounter Code Description Active Date Today Diagnosis L97.223 Non-pressure chronic ulcer of left calf with necrosis of muscle 12/28/2017 No Yes I89.0 Lymphedema, not elsewhere classified 12/28/2017 No Yes I87.332 Chronic venous hypertension (idiopathic) with ulcer and 12/28/2017 No Yes inflammation of left lower extremity E11.622 Type 2 diabetes mellitus with other skin ulcer 12/28/2017 No Yes L97.211 Non-pressure chronic ulcer of right calf limited to breakdown 02/22/2018 No Yes of skin Inactive Problems Resolved Problems Electronic Signature(s) Signed: 06/01/2018 5:56:52 AM By: Linton Ham MD Entered By: Linton Ham on 05/31/2018 08:46:50 Jefcoat, Oskar A. ([id number]) -------------------------------------------------------------------------------- Progress Note Details Patient Name: Rathod, Anais A. Date of Service: 05/31/2018 8:15 AM Medical  Record Number: [id number] Patient Account Number: [id number] Date of Birth/Sex: [birth year] (47 y.o. M) Treating RN: Cornell Barman Primary Care Provider: Gaetano Net Other Clinician: Referring Provider: Gaetano Net Treating Provider/Extender: Tito Dine in Treatment: 22 Subjective Chief Complaint Information obtained from Patient 12/28/17; patient is here for review of 2 surgical wounds on the left calf History of Present Illness (HPI) 12/28/17; this is a now 47 year old man who is a type II diabetic. He was hospitalized from 10/01/17 through 10/19/17. He had an MSSA soft tissue and skin infection. 2 open areas on the left leg were identified he has a smaller area on the left medial calf superiorly just below the knee and a wound just above the left ankle on the posterior medial aspect. I think both of these were surgical IandD sites when he was in the hospital. He was discharged with a wound VAC at that point however this is since been taken off. He follows with Dr. Ola Spurr for the [facility] and he is still on chronic Keflex at 500 twice a day. At that time he was hospitalized his hemoglobin A1c was 15.1 however if I'm reading his endocrinologist notes correctly that is improved. He has been following with Dr. [name] at [facility] and he has been applying calcium alginate and Unna boots. He has home health changing the dressing. They have also been attempting to get him external compression pumps although the patient is unaware whether they've been approved by insurance at this point. as mentioned he has a smaller clean wound on the right lateral calf just below the knee and he has a much larger area just above the left ankle medially and posteriorly. Our intake nurse reported greenish purulent looking drainage.the patient did have surgical material sent to pathology in February. This showed chronic abscess The patient also has lymphedema stage III in the left greater than right  lower extremities. He has a history of blisters with wounds but these of all were always healed. The patient thinks that the lymphedema may have been present since he was about 47 years old i.e. about 30 years. He does not have graded pressure stockings and has not worn stockings. He does not have a distant history of DVT PE or phlebitis. He has not been systemically unwell fever no chills. He states that his Lasix is recently been reduced. He tells me his kidney function is at "30%" and he has been followed by Dr. [name] of nephrology. At one point he was on Lasix 80 twice a day however that's been cut back and he is now on Lasix at 20 twice a day. The patient has a history of PAD listed in  his records although he comes from Dr. [name] I don't think is felt to have significant PAD. ABIs in our clinic were noncompressible bilaterally. 01/04/18; patient has a large wound on the left lateral lower calf and a small wound on the left medial upper calf. He has been to see his nephrologist who changed him to Demadex 40 mg a day. I'm hopeful this will help with his systemic fluid overload. He also has stage III lymphedema. Really no change in the 2 wounds since last week 01/11/18; the patient is down 13 pounds. He put his stage III lymphedema left leg in 4K compression last week and there is less edema fluid however we still haven't been able to communicate with home health but apparently [facility] is [facility] but the dressings have not been changed. The patient noted an odor last week. He is also had compression pumps ordered by [facility] this as a not completed the paperwork stage. 01/18/18; patient continues to lose weight. Stage III lymphedema in the left greater than right leg under for alert compression. The major wound is on the left lateral ankle area. He apparently has bilateral compression pumps being brought to his house, these were ordered by [facility] Notable for the  fact today he had some blisters on the right anterior leg together with some skin nodules. This is no doubt secondary to severe lymphedema. 01/25/18; the patient has obtained his compression pumps and is using them per vein and vascular instructions 3 times a day for an hour. He also saw [name] of vein and vascular. He was felt to have venous insufficiency but did not suggest any Rothbauer, Nijah A. ([id number]) intervention also improved edema. It was suggested that he have compression stockings 20-30 mm on a daily basis in addition to compression pumps. The patient arrives in clinic today with a layer of unna under for layer compression. he seems to have some trouble with the degree of compression. He has open areas on the left lateral ankle area which is his major wound left upper medial calf and a superficial open area on the right anterior shin area which was blistered last week. He has skin changes on the right anterior calf which I think are no doubt secondary to lymphedema skin nodules etc. 02/01/18; the patient comes in telling us his nephrologist have to his torsemide. Unfortunately today he is put on 7 pounds by our scales. He has blisters all over the anterior and medial part of his right calf and a new open wound. He also has soupy green drainage coming out of the left lateral calf /ankle wound. 02/08/18; culture I did last week of the left lateral ankle wound grew both Pseudomonas and Morganella. He is on Keflex from Dr. Ola Spurr in the hospital. I will need to review these notes.in any case Keflex is not going to cover these 2 organisms. I'm probably going to added ciprofloxacin today for 1 week. A lot of drainage that looks purulent last week. He is not complaining of pain however he has managed to put on 10 pounds in 2 weeks by our scales in this clinic. He is going to see his nephrologist tomorrow 02/15/18; he completed the ciprofloxacin I gave him last week. Notable that he is up to  379 pounds today which is up 16 pounds from 2 weeks ago. He is complaining of orthopnea but doesn't have any chest pain. 02/22/18; he continues to have weight gain. R intake nurse reports again  purulent green drainage coming out of the lateral wound on the lateral left calf. He has small open area on the right anterior leg. His torsemide was increased to 2 tablets a day I believe this is 40 mg last week in response to the call admitted to Dr. Keturah Barre office. He follows up with Dr. [name] and Dr. Delana Meyer tomorrow 03/01/18 his weight essentially stable today at 383 pounds. Drainage out of the left lateral wound on the lower left calf/ankle is a lot less. Culture last time grew Pseudomonas. I put him on cefdinir. He has been to Dr. Keturah Barre office no adjustments in his diuretics. Dr. Nicoletta Dress prescribed a wraparound stocking for the right leg.there is no open area on the right leg. He has a superficial area on the left medial calf, left posterior calf and in the large area on the left lateral however this looks better 03/15/18; weight is not up to 393 pounds. He saw his nephrologist yesterday Dr. [name] will increase the Demadex I'm hopeful this will help with the edema control. I'm using silver alginate to all his wounds. In particular the left lateral ankle looks better. Unfortunately he has new open areas on the right lateral calf that will include use of his compression stocking at least in the short to medium term. He has new wounds o3 on the right lateral calf. One of these has some size however all numerous superficial 03/22/18; his weight is stabilized a bit. Just adjustment of his diuretics by his nephrologist. There is no doubt he has some degree of systemic fluid overload on top of severe left greater than right lymphedema. 2 weeks ago tried to transition him to stockings on the right leg however he developed re-breakdown of skin on the right leg and we had to put him back in compression last week.  He also uses external compression pumps and claims to be compliant Continued concern about his depression today 03/29/18; several ongoing issues with this patient; He no longer has home health coverage apparently secondary to a lapse in insurance. He is apparently transitioning from short for long-term disability. [facility] was changing his compression wraps on Monday and Friday. He has gained 10 pounds since last week Sees Dr. [name] tomorrow Saw his primary doctor last week about the depression. It sounds as though he declined pharmacologic management. He seems somewhat better today. We were concerned last week when he came. Somewhat better today He is using his compression pumps once a day at home, I have asked for twice a day if possible especially on the left leg Follows up with his nephrologist in mid-August. He is managing his diuretic for I think stage IV chronic renal failure Paradoxically his wounds actually look better 04/19/18; the patient has not been seen since I last saw him 3 weeks ago. He saw Dr. Delana Meyer of vascular surgery on 03/30/18 I believe he put him in a 20/30 stocking bilaterally with a wraparound extremitease stocking. He has not been putting anything specifically on the wound. More problematic than that he has not been wearing the stockings he is at home. He has been using his external compression pumps once per day according to him on a rare occasion twice On a psychosocial level the patient is now on long-term disability and is applying for COBRA therefore he is between insurances. He has not been able to follow up with Dr. [name] who is his nephrologist as a result. As noted his weight is up to 407 pounds  today. He promises me he'll follow-up with Dr. [name] 05/03/18; he hasn't been here in 2 weeks now. He apparently has been wearing a compression sock on the left leg. Massive increase in edema 3 large open wounds on the left anterior leg that were probably blisters.  Significant deterioration in the left lateral ankle wound that we've been doing as his most problematic wound. He still does not have his insurance issues wrapped up. His weight is well over 400 pounds. Oats, Rahim A. ([id number]) 05/10/18; arrives today with better looking edema control in the left leg. He has been using his compression pumps twice a day. We also wrapped it is left leg for there he's been using his pumps so there is much better edema control. Most of new wounds from last week look a lot better. Even the refractory area on the lower left lateral ankle looks a lot better to me today. He follows up with Dr. [name] this morning [nephrology] 05/17/18 patient arrives today with a lot less edema in the left leg. His weight is gone down 7 pounds. He tells me he saw Dr. [name] but his torsemide was not adjusted. He is using the palms 3 times a day. There is been quite an improvement in the remaining wound on the left lateral ankle Weekly visit for follow-up of bilateral lower extremity wounds related to severe lymphedema and probably some degree of systemic fluid overload from chronic renal failure stage IV. His weight is up this week to over 400 pounds. He noted increasing edema in the right leg late last week. He took his compression stocking off he did not increase the frequency of this compression pump use. He developed a large blister on the back of the right calf. He also has a new opened blister on the left lateral calf in addition to the wound that we've been using on the distal left lower calf area and we've been using silver alginate. He tells me that he has an ultrasound which is a DVT rule out and I think a reflux study ordered by Dr. [name] 05/31/18; weekly visit. He went to see [facility] vascular this week apparently they remove the 4-layer compression on the left and put an Unna boot on him in replacement. He's got more swelling in the left leg is resolved. That being said his  left lower Wound is better. He still has a fairly large wound on the right posterior calf this was not disturbed. He has a follow-up appointment with Dr. Keturah Barre nephrologist next week Objective Constitutional Patient is hypertensive.. Pulse regular and within target range for patient.[name] Respirations regular, non-labored and within target range.. Temperature is normal and within the target range for the patient.[name] appears in no distress. Vitals Time Taken: 8:08 AM, Height: 76 in, Weight: 401.1 lbs, BMI: 48.8, Temperature: 98.2 F, Pulse: 60 bpm, Respiratory Rate: 18 breaths/min, Blood Pressure: 173/77 mmHg. Eyes Conjunctivae clear. No discharge. Cardiovascular JVP is borderline elevated. He has no S3. Pedal pulses palpable and strong bilaterally.. Edema present in both extremities.This is mostly lymphedema but he has also has a pitting component bilaterally. Gastrointestinal (GI) Obese but not distended. I don't believe there is ascites. Psychiatric No evidence of depression, anxiety, or agitation. Calm, cooperative, and communicative. Appropriate interactions and affect.. General Notes: Wound exam The patient's wound that we have been following his on the left lateral lower calf. This is just about closed still some macerated areas. I thought this needed to be redressed next week. On  the right posterior calf he has a fairly sizable reminiscent of a blister this as a clean base. No debridement is required. Integumentary (Hair, Skin) Lymphedema of the left leg greater than the right. Worthy, Hillis A. ([id number]) Wound #15 status is Open. Original cause of wound was Gradually Appeared. The wound is located on the Left,Proximal Lower Leg. The wound measures 0.2cm length x 0.2cm width x 0.1cm depth; 0.031cm^2 area and 0.003cm^3 volume. Wound #16 status is Open. Original cause of wound was Gradually Appeared. The wound is located on the Right,Posterior Lower Leg. The wound measures 8cm length x  6.8cm width x 0.1cm depth; 42.726cm^2 area and 4.273cm^3 volume. Wound #2 status is Open. Original cause of wound was Gradually Appeared. The wound is located on the Left,Lateral,Posterior Lower Leg. The wound measures 1.4cm length x 4cm width x 0.3cm depth; 4.398cm^2 area and 1.319cm^3 volume. Assessment Active Problems ICD-10 Non-pressure chronic ulcer of left calf with necrosis of muscle Lymphedema, not elsewhere classified Chronic venous hypertension (idiopathic) with ulcer and inflammation of left lower extremity Type 2 diabetes mellitus with other skin ulcer Non-pressure chronic ulcer of right calf limited to breakdown of skin Procedures Wound #15 Pre-procedure diagnosis of Wound #15 is a Lymphedema located on the Left,Proximal Lower Leg . There was a Four Layer Compression Therapy Procedure by Cornell Barman, RN. Post procedure Diagnosis Wound #15: Same as Pre-Procedure Wound #16 Pre-procedure diagnosis of Wound #16 is a Lymphedema located on the Right,Posterior Lower Leg . There was a Four Layer Compression Therapy Procedure by Cornell Barman, RN. Post procedure Diagnosis Wound #16: Same as Pre-Procedure Plan Wound Cleansing: Wound #15 Left,Proximal Lower Leg: Clean wound with Normal Saline. Cleanse wound with mild soap and water Wound #16 Right,Posterior Lower Leg: Clean wound with Normal Saline. Cleanse wound with mild soap and water Wound #2 Left,Lateral,Posterior Lower Leg: Clean wound with Normal Saline. Dethloff, Bruno A. ([id number]) Cleanse wound with mild soap and water Primary Wound Dressing: Wound #15 Left,Proximal Lower Leg: Silver Alginate Wound #16 Right,Posterior Lower Leg: Silver Alginate Wound #2 Left,Lateral,Posterior Lower Leg: Silver Alginate Secondary Dressing: Wound #15 Left,Proximal Lower Leg: Other - xsorb Wound #16 Right,Posterior Lower Leg: Other - xsorb Wound #2 Left,Lateral,Posterior Lower Leg: Other - xsorb Dressing Change Frequency: Wound #15  Left,Proximal Lower Leg: Change dressing every week Wound #16 Right,Posterior Lower Leg: Change dressing every week Wound #2 Left,Lateral,Posterior Lower Leg: Change dressing every week Follow-up Appointments: Wound #15 Left,Proximal Lower Leg: Return Appointment in 1 week. Wound #16 Right,Posterior Lower Leg: Return Appointment in 1 week. Wound #2 Left,Lateral,Posterior Lower Leg: Return Appointment in 1 week. Edema Control: Wound #15 Left,Proximal Lower Leg: 4 Layer Compression System - Bilateral Compression Pump: Use compression pump on left lower extremity for 30 minutes, twice daily. - 1 Hour twice Daily Compression Pump: Use compression pump on right lower extremity for 30 minutes, twice daily. - 1 Hour twice daily Wound #16 Right,Posterior Lower Leg: 4 Layer Compression System - Bilateral Compression Pump: Use compression pump on left lower extremity for 30 minutes, twice daily. - 1 Hour twice Daily Compression Pump: Use compression pump on right lower extremity for 30 minutes, twice daily. - 1 Hour twice daily Wound #2 Left,Lateral,Posterior Lower Leg: 4 Layer Compression System - Bilateral Compression Pump: Use compression pump on left lower extremity for 30 minutes, twice daily. - 1 Hour twice Daily Compression Pump: Use compression pump on right lower extremity for 30 minutes, twice daily. - 1 Hour twice daily #1 the patient's left leg had  an Haematologist That was put on at [facility]. We'll put him back in 4-layer compression. He has more swelling in this leg. In spite of this his wound looks better. We'll continue with silver alginate #2The area on the posterior right calf is a remnant of a blister that his open. This should close with better swelling control. We'll use silver alginate and 4-layer compression there are #3 apparently the patient's fluid status is difficult to exactly determine. Overuse of diuretics especially IV diuretics in the hospital resulted in  prerenal azotemia. I counseled him today that I didn't think there was any danger of him currently being dehydrated. In fact I think he probably will need more Demadex if his renal function will allow. As noted he sees Dr. [name] next week Electronic Signature(s) OLAF, MESA ([id number]) Signed: 06/01/2018 5:56:52 AM By: Linton Ham MD Entered By: Linton Ham on 05/31/2018 08:53:20 Pittman, Jemmie A. ([id number]) -------------------------------------------------------------------------------- SuperBill Details Patient Name: Rostad, Alfie A. Date of Service: 05/31/2018 Medical Record Number: [id number] Patient Account Number: [id number] Date of Birth/Sex: Sep 19, 1970 (47 y.o. M) Treating RN: Cornell Barman Primary Care Provider: Gaetano Net Other Clinician: Referring Provider: Gaetano Net Treating Provider/Extender: Tito Dine in Treatment: 22 Diagnosis Coding ICD-10 Codes Code Description [phone] Non-pressure chronic ulcer of left calf with necrosis of muscle I89.0 Lymphedema, not elsewhere classified I87.332 Chronic venous hypertension (idiopathic) with ulcer and inflammation of left lower extremity E11.622 Type 2 diabetes mellitus with other skin ulcer L97.211 Non-pressure chronic ulcer of right calf limited to breakdown of skin Facility Procedures CPT4: Description Modifier Quantity Code [id number] BILATERAL: Application of multi-layer venous compression system; leg (below 1 knee), including ankle and foot. Physician Procedures CPT4 Code: [id number] Description: 38177 - WC PHYS LEVEL 3 - EST PT ICD-10 Diagnosis Description L97.223 Non-pressure chronic ulcer of left calf with necrosis of m I89.0 Lymphedema, not elsewhere classified Modifier: uscle Quantity: 1 Electronic Signature(s) Signed: 06/01/2018 5:56:52 AM By: Linton Ham MD Entered By: Linton Ham on 05/31/2018 08:54:54

## 2018-06-07 ENCOUNTER — Encounter: Payer: BLUE CROSS/BLUE SHIELD | Admitting: Internal Medicine

## 2018-06-07 DIAGNOSIS — E11622 Type 2 diabetes mellitus with other skin ulcer: Secondary | ICD-10-CM | POA: Diagnosis not present

## 2018-06-10 NOTE — Progress Notes (Signed)
BURNEY, CALZADILLA ([id number]) Visit Report for 06/07/2018 HPI Details Patient Name: Sassano, Drequan A. Date of Service: 06/07/2018 12:45 PM Medical Record Number: [id number] Patient Account Number: [id number] Date of Birth/Sex: 12/23/1970 (47 y.o. M) Treating RN: Cornell Barman Primary Care Provider: Gaetano Net Other Clinician: Referring Provider: Gaetano Net Treating Provider/Extender: Tito Dine in Treatment: 23 History of Present Illness HPI Description: 12/28/17; this is a now 47 year old man who is a type II diabetic. He was hospitalized from 10/01/17 through 10/19/17. He had an MSSA soft tissue and skin infection. 2 open areas on the left leg were identified he has a smaller area on the left medial calf superiorly just below the knee and a wound just above the left ankle on the posterior medial aspect. I think both of these were surgical IandD sites when he was in the hospital. He was discharged with a wound VAC at that point however this is since been taken off. He follows with Dr. Ola Spurr for the [facility] and he is still on chronic Keflex at 500 twice a day. At that time he was hospitalized his hemoglobin A1c was 15.1 however if I'm reading his endocrinologist notes correctly that is improved. He has been following with Dr. [name] at vein and vascular and he has been applying calcium alginate and Unna boots. He has home health changing the dressing. They have also been attempting to get him external compression pumps although the patient is unaware whether they've been approved by insurance at this point. as mentioned he has a smaller clean wound on the right lateral calf just below the knee and he has a much larger area just above the left ankle medially and posteriorly. Our intake nurse reported greenish purulent looking drainage.the patient did have surgical material sent to pathology in February. This showed chronic abscess The patient also has lymphedema stage III in the left  greater than right lower extremities. He has a history of blisters with wounds but these of all were always healed. The patient thinks that the lymphedema may have been present since he was about 47 years old i.e. about 30 years. He does not have graded pressure stockings and has not worn stockings. He does not have a distant history of DVT PE or phlebitis. He has not been systemically unwell fever no chills. He states that his Lasix is recently been reduced. He tells me his kidney function is at "30%" and he has been followed by Dr. [name] of nephrology. At one point he was on Lasix 80 twice a day however that's been cut back and he is now on Lasix at 20 twice a day. The patient has a history of PAD listed in his records although he comes from Dr. [name] I don't think is felt to have significant PAD. ABIs in our clinic were noncompressible bilaterally. 01/04/18; patient has a large wound on the left lateral lower calf and a small wound on the left medial upper calf. He has been to see his nephrologist who changed him to Demadex 40 mg a day. I'm hopeful this will help with his systemic fluid overload. He also has stage III lymphedema. Really no change in the 2 wounds since last week 01/11/18; the patient is down 13 pounds. He put his stage III lymphedema left leg in 4K compression last week and there is less edema fluid however we still haven't been able to communicate with home health but [facility] it is [facility] but the dressings have not been changed. The  patient noted an odor last week. He is also had compression pumps ordered by [facility] this as a not completed the paperwork stage. 01/18/18; patient continues to lose weight. Stage III lymphedema in the left greater than right leg under for alert compression. The major wound is on the left lateral ankle area. He apparently has bilateral compression pumps being brought to his house, these were ordered by [facility] Notable for the fact today he had some blisters on the right anterior leg together with some skin nodules. This is no doubt secondary to severe lymphedema. 01/25/18; the patient has obtained his compression pumps and is using them per vein and vascular instructions 3 times a day for an hour. He also saw [name] of vein and vascular. He was felt to have venous insufficiency but did not suggest any intervention also improved edema. It was suggested that he have compression stockings 20-30 mm on a daily basis in addition to compression pumps. Marcano, Shahid A. ([id number]) The patient arrives in clinic today with a layer of unna under for layer compression. he seems to have some trouble with the degree of compression. He has open areas on the left lateral ankle area which is his major wound left upper medial calf and a superficial open area on the right anterior shin area which was blistered last week. He has skin changes on the right anterior calf which I think are no doubt secondary to lymphedema skin nodules etc. 02/01/18; the patient comes in telling us his nephrologist have to his torsemide. Unfortunately today he is put on 7 pounds by our scales. He has blisters all over the anterior and medial part of his right calf and a new open wound. He also has soupy green drainage coming out of the left lateral calf /ankle wound. 02/08/18; culture I did last week of the left lateral ankle wound grew both Pseudomonas and Morganella. He is on Keflex from Dr. Ola Spurr in the hospital. I will need to review these notes.in any case Keflex is not going to cover these 2 organisms. I'm probably going to added ciprofloxacin today for 1 week. A lot of drainage that looks purulent last week. He is not complaining of pain however he has managed to put on 10 pounds in 2 weeks by our scales in this clinic. He is going to see his nephrologist tomorrow 02/15/18; he completed the ciprofloxacin I gave him last week.  Notable that he is up to 379 pounds today which is up 16 pounds from 2 weeks ago. He is complaining of orthopnea but doesn't have any chest pain. 02/22/18; he continues to have weight gain. R intake nurse reports again purulent green drainage coming out of the lateral wound on the lateral left calf. He has small open area on the right anterior leg. His torsemide was increased to 2 tablets a day I believe this is 40 mg last week in response to the call admitted to Dr. Keturah Barre office. He follows up with Dr. [name] and Dr. Delana Meyer tomorrow 03/01/18 his weight essentially stable today at 383 pounds. Drainage out of the left lateral wound on the lower left calf/ankle is a lot less. Culture last time grew Pseudomonas. I put him on cefdinir. He has been to Dr. Keturah Barre office no adjustments in his diuretics. Dr. Nicoletta Dress prescribed a wraparound stocking for the right leg.there is no open area on the right leg. He has a superficial area on the left medial calf, left posterior  calf and in the large area on the left lateral however this looks better 03/15/18; weight is not up to 393 pounds. He saw his nephrologist yesterday Dr. [name] will increase the Demadex I'm hopeful this will help with the edema control. I'm using silver alginate to all his wounds. In particular the left lateral ankle looks better. oUnfortunately he has new open areas on the right lateral calf that will include use of his compression stocking at least in the short to medium term. He has new wounds o3 on the right lateral calf. One of these has some size however all numerous superficial 03/22/18; his weight is stabilized a bit. Just adjustment of his diuretics by his nephrologist. There is no doubt he has some degree of systemic fluid overload on top of severe left greater than right lymphedema. 2 weeks ago tried to transition him to stockings on the right leg however he developed re-breakdown of skin on the right leg and we had to put him back  in compression last week. He also uses external compression pumps and claims to be compliant Continued concern about his depression today 03/29/18; several ongoing issues with this patient; oHe no longer has home health coverage apparently secondary to a lapse in insurance. He is apparently transitioning from short for long-term disability. [facility] was changing his compression wraps on Monday and Friday. oHe has gained 10 pounds since last week oSees Dr. [name] tomorrow oSaw his primary doctor last week about the depression. It sounds as though he declined pharmacologic management. He seems somewhat better today. We were concerned last week when he came. Somewhat better today oHe is using his compression pumps once a day at home, I have asked for twice a day if possible especially on the left leg oFollows up with his nephrologist in mid-August. He is managing his diuretic for I think stage IV chronic renal failure oParadoxically his wounds actually look better 04/19/18; the patient has not been seen since I last saw him 3 weeks ago. He saw Dr. Delana Meyer of vascular surgery on 03/30/18 I believe he put him in a 20/30 stocking bilaterally with a wraparound extremitease stocking. He has not been putting anything specifically on the wound. More problematic than that he has not been wearing the stockings he is at home. He has been using his external compression pumps once per day according to him on a rare occasion twice On a psychosocial level the patient is now on long-term disability and is applying for COBRA therefore he is between insurances. He has not been able to follow up with Dr. [name] who is his nephrologist as a result. As noted his weight is up to 407 pounds today. He promises me he'll follow-up with Dr. [name] 05/03/18; he hasn't been here in 2 weeks now. He apparently has been wearing a compression sock on the left leg. Massive increase in edema 3 large open wounds on the left anterior  leg that were probably blisters. Significant deterioration in the left lateral ankle wound that we've been doing as his most problematic wound. He still does not have his insurance issues wrapped up. His weight is well over 400 pounds. 05/10/18; arrives today with better looking edema control in the left leg. He has been using his compression pumps twice a day. We also wrapped it is left leg for there he's been using his pumps so there is much better edema control. Most of new wounds from last week look a lot better. Even the  refractory area on the lower left lateral ankle looks a lot better to me today. Casares, Derrill A. ([id number]) He follows up with Dr. [name] this morning [nephrology] 05/17/18 patient arrives today with a lot less edema in the left leg. His weight is gone down 7 pounds. He tells me he saw Dr. [name] but his torsemide was not adjusted. He is using the palms 3 times a day. There is been quite an improvement in the remaining wound on the left lateral ankle oWeekly visit for follow-up of bilateral lower extremity wounds related to severe lymphedema and probably some degree of systemic fluid overload from chronic renal failure stage IV. His weight is up this week to over 400 pounds. He noted increasing edema in the right leg late last week. He took his compression stocking off he did not increase the frequency of this compression pump use. He developed a large blister on the back of the right calf. He also has a new opened blister on the left lateral calf in addition to the wound that we've been using on the distal left lower calf area and we've been using silver alginate. He tells me that he has an ultrasound which is a DVT rule out and I think a reflux study ordered by Dr. [name] 05/31/18; weekly visit. He went to see vein and vascular this week apparently they remove the 4-layer compression on the left and put an Unna boot on him in replacement. He's got more swelling in the left leg  is resolved. That being said his left lower Wound is better. He still has a fairly large wound on the right posterior calf this was not disturbed. He has a follow-up appointment with Dr. Keturah Barre nephrologist next week 06/07/2018; the patient arrives today with a history that he took both his compression wraps off 3 days ago in order to take a shower. He has bilateral severe tense blisters. A lot of weeping erythema especially on the lateral left calf. Almost circumferential blisters on the right. Multiple areas of epithelial breakdown. He does not complain of any pain fever chills. He states he has been using his compression pump in some form of stocking that I could not really determine the type. He did not come into the clinic with anything on his legs. He tells me he has an appointment with Dr. Merita Norton his nephrologist I believe this Friday. Paradoxically his weight is actually down to 385 pounds I believe last week he was over 400 Electronic Signature(s) Signed: 06/08/2018 8:07:08 AM By: Linton Ham MD Entered By: Linton Ham on 06/08/2018 07:18:43 Giovanetti, Avid AMarland Kitchen ([id number]) -------------------------------------------------------------------------------- Physical Exam Details Patient Name: Vineyard, Raysean A. Date of Service: 06/07/2018 12:45 PM Medical Record Number: [id number] Patient Account Number: [id number] Date of Birth/Sex: 1971/06/08 (47 y.o. M) Treating RN: Cornell Barman Primary Care Provider: Gaetano Net Other Clinician: Referring Provider: Gaetano Net Treating Provider/Extender: Tito Dine in Treatment: 31 Constitutional Patient is hypertensive.. Pulse regular and within target range for patient.[name] Respirations regular, non-labored and within target range.. Temperature is normal and within the target range for the patient.[name] appears in no distress. Eyes Conjunctivae clear. No discharge. Respiratory Respiratory effort is easy and symmetric bilaterally. Rate is  normal at rest and on room air.. Bilateral breath sounds are clear and equal in all lobes with no wheezes, rales or rhonchi.. Cardiovascular Heart rhythm and rate regular, without murmur or gallop. JVP is not elevated. Pedal pulses palpable and strong bilaterally.. Lymphedema left greater than right perhaps  slightly worse but not markedly so. Gastrointestinal (GI) Less distended nontender no masses. Genitourinary (GU) No gross bladder distention is noted. Lymphatic None palpable in the popliteal area bilaterally. Psychiatric Patient appears depressed today.. Notes Wound exam oThe patient's wound that we have been following on the left lateral lower calf has not much changed however he has tense blisters bilaterally especially on the left calf but also almost circumferentially on the right. There was a lot of epithelial loss bilaterally. I opened a lot of these blisters since there was no way that epithelium was going to see a intact. There was absolutely no palpable tenderness. I do not believe this has anything to do with infection Electronic Signature(s) Signed: 06/08/2018 8:07:08 AM By: Linton Ham MD Entered By: Linton Ham on 06/08/2018 07:24:44 Twombly, Jemmie AMarland Kitchen ([id number]) -------------------------------------------------------------------------------- Physician Orders Details Patient Name: Caffee, Loni A. Date of Service: 06/07/2018 12:45 PM Medical Record Number: [id number] Patient Account Number: [id number] Date of Birth/Sex: 1971-04-04 (47 y.o. M) Treating RN: Cornell Barman Primary Care Provider: Gaetano Net Other Clinician: Referring Provider: Gaetano Net Treating Provider/Extender: Tito Dine in Treatment: 14 Verbal / Phone Orders: No Diagnosis Coding Wound Cleansing Wound #16 Right,Posterior Lower Leg o Clean wound with Normal Saline. o Cleanse wound with mild soap and water Wound #2 Left,Lateral,Posterior Lower Leg o Clean wound with  Normal Saline. o Cleanse wound with mild soap and water o Clean wound with Normal Saline. o Cleanse wound with mild soap and water Primary Wound Dressing Wound #16 Right,Posterior Lower Leg o Silver Alginate Wound #2 Left,Lateral,Posterior Lower Leg o Silver Alginate o Silver Alginate Secondary Dressing Wound #16 Right,Posterior Lower Leg o Other - absorbent dressing Wound #2 Left,Lateral,Posterior Lower Leg o Other - absorbent dressing Dressing Change Frequency Wound #16 Right,Posterior Lower Leg o Change dressing every week o Other: - nurse visit if needed Wound #2 Left,Lateral,Posterior Lower Leg o Change dressing every week o Other: - nurse visit if needed Follow-up Appointments Wound #16 Right,Posterior Lower Leg o Return Appointment in 1 week. o Nurse Visit as needed Wound #2 Left,Lateral,Posterior Lower Leg o Return Appointment in 1 week. o Nurse Visit as needed KAHARI, CRITZER A. ([id number]) Edema Control Wound #16 Right,Posterior Lower Leg o 4 Layer Compression System - Bilateral o Compression Pump: Use compression pump on left lower extremity for 30 minutes, twice daily. - 1 Hour twice Daily o Compression Pump: Use compression pump on right lower extremity for 30 minutes, twice daily. - 1 Hour twice daily Wound #2 Left,Lateral,Posterior Lower Leg o 4 Layer Compression System - Bilateral o Compression Pump: Use compression pump on left lower extremity for 30 minutes, twice daily. - 1 Hour twice Daily o Compression Pump: Use compression pump on right lower extremity for 30 minutes, twice daily. - 1 Hour twice daily Electronic Signature(s) Signed: 06/07/2018 5:34:51 PM By: Gretta Cool, BSN, RN, CWS, Kim RN, BSN Signed: 06/07/2018 6:14:59 PM By: Linton Ham MD Entered By: Gretta Cool, BSN, RN, CWS, Kim on 06/07/2018 13:20:49 Cardiff, Jory AMarland Kitchen  ([id number]) -------------------------------------------------------------------------------- Problem List Details Patient Name: Perri, Ricard A. Date of Service: 06/07/2018 12:45 PM Medical Record Number: [id number] Patient Account Number: [id number] Date of Birth/Sex: 24-Nov-1970 (47 y.o. 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 ([id number]) -------------------------------------------------------------------------------- Progress Note Details Patient Name: Janicki, Abu A. Date of Service: 06/07/2018 12:45 PM Medical Record Number: [id number] Patient Account Number: [id number] Date of Birth/Sex: May 27, 1971 (47 y.o. M) Treating RN: Cornell Barman Primary Care Provider: Gaetano Net Other Clinician: Referring Provider: Gaetano Net Treating Provider/Extender: Tito Dine in Treatment: 23 Subjective History of Present Illness (HPI) 12/28/17; this is a now 47 year old man who is a type II diabetic. He was hospitalized from 10/01/17 through 10/19/17. He had an MSSA soft tissue and skin infection. 2 open areas on the left leg were identified he has a smaller  area on the left medial calf superiorly just below the knee and a wound just above the left ankle on the posterior medial aspect. I think both of these were surgical IandD sites when he was in the hospital. He was discharged with a wound VAC at that point however this is since been taken off. He follows with Dr. Ola Spurr for the [facility] and he is still on chronic Keflex at 500 twice a day. At that time he was hospitalized his hemoglobin A1c was 15.1 however if I'm reading his endocrinologist notes correctly that is improved. He has been following with Dr. [name] at [facility] and he has been applying calcium alginate and Unna boots. He has home health changing the dressing. They have also been attempting to get him external compression pumps although the patient is unaware whether they've been approved by insurance at this point. as mentioned he has a smaller clean wound on the right lateral calf just below the knee and he has a much larger area just above the left ankle medially and posteriorly. Our intake nurse reported greenish purulent looking drainage.the patient did have surgical material sent to pathology in February. This showed chronic abscess The patient also has lymphedema stage III in the left greater than right lower extremities. He has a history of blisters with wounds but these of all were always healed. The patient thinks that the lymphedema may have been present since he was about 47 years old i.e. about 30 years. He does not have graded pressure stockings and has not worn stockings. He does not have a distant history of DVT PE or phlebitis. He has not been systemically unwell fever no chills. He states that his Lasix is recently been reduced. He tells me his kidney function is at "30%" and he has been followed by Dr. [name] of nephrology. At one point he was on Lasix 80 twice a day however that's been cut back and he is now on Lasix at 20 twice a day. The patient has a  history of PAD listed in his records although he comes from Dr. [name] I don't think is felt to have significant PAD. ABIs in our clinic were noncompressible bilaterally. 01/04/18; patient has a large wound on the left lateral lower calf and a small wound on the left medial upper calf. He has been to see his nephrologist who changed him to Demadex 40 mg a day. I'm hopeful this will help with his systemic fluid  overload. He also has stage III lymphedema. Really no change in the 2 wounds since last week 01/11/18; the patient is down 13 pounds. He put his stage III lymphedema left leg in 4K compression last week and there is less edema fluid however we still haven't been able to communicate with home health but apparently [facility] is [facility] but the dressings have not been changed. The patient noted an odor last week. He is also had compression pumps ordered by [facility] this as a not completed the paperwork stage. 01/18/18; patient continues to lose weight. Stage III lymphedema in the left greater than right leg under for alert compression. The major wound is on the left lateral ankle area. He apparently has bilateral compression pumps being brought to his house, these were ordered by  [facility]in and vascular Notable for the fact today he had some blisters on the right anterior leg together with some skin nodules. This is no doubt secondary to severe lymphedema. 01/25/18; the patient has obtained his compression pumps and is using them per vein and vascular instructions 3 times a day for an hour. He also saw [name] of vein and vascular. He was felt to have venous insufficiency but did not suggest any intervention also improved edema. It was suggested that he have compression stockings 20-30 mm on a daily basis in addition to compression pumps. The patient arrives in clinic today with a layer of unna under for layer compression. he seems to have some trouble with the degree of compression.  He has open areas on the left lateral ankle area which is his major wound left upper medial calf and a Pariseau, Rik A. ([id number]) superficial open area on the right anterior shin area which was blistered last week. He has skin changes on the right anterior calf which I think are no doubt secondary to lymphedema skin nodules etc. 02/01/18; the patient comes in telling us his nephrologist have to his torsemide. Unfortunately today he is put on 7 pounds by our scales. He has blisters all over the anterior and medial part of his right calf and a new open wound. He also has soupy green drainage coming out of the left lateral calf /ankle wound. 02/08/18; culture I did last week of the left lateral ankle wound grew both Pseudomonas and Morganella. He is on Keflex from Dr. Ola Spurr in the hospital. I will need to review these notes.in any case Keflex is not going to cover these 2 organisms. I'm probably going to added ciprofloxacin today for 1 week. A lot of drainage that looks purulent last week. He is not complaining of pain however he has managed to put on 10 pounds in 2 weeks by our scales in this clinic. He is going to see his nephrologist tomorrow 02/15/18; he completed the ciprofloxacin I gave him last week. Notable that he is up to 379 pounds today which is up 16 pounds from 2 weeks ago. He is complaining of orthopnea but doesn't have any chest pain. 02/22/18; he continues to have weight gain. R intake nurse reports again purulent green drainage coming out of the lateral wound on the lateral left calf. He has small open area on the right anterior leg. His torsemide was increased to 2 tablets a day I believe this is 40 mg last week in response to the call admitted to Dr. Keturah Barre office. He follows up with Dr. [name] and Dr. Delana Meyer tomorrow 03/01/18 his weight essentially stable today at 383 pounds. Drainage out  of the left lateral wound on the lower left calf/ankle is a lot less. Culture last time grew  Pseudomonas. I put him on cefdinir. He has been to Dr. Keturah Barre office no adjustments in his diuretics. Dr. Nicoletta Dress prescribed a wraparound stocking for the right leg.there is no open area on the right leg. He has a superficial area on the left medial calf, left posterior calf and in the large area on the left lateral however this looks better 03/15/18; weight is not up to 393 pounds. He saw his nephrologist yesterday Dr. [name] will increase the Demadex I'm hopeful this will help with the edema control. I'm using silver alginate to all his wounds. In particular the left lateral ankle looks better. Unfortunately he has new open areas on the right lateral calf that will include use of his compression stocking at least in the short to medium term. He has new wounds o3 on the right lateral calf. One of these has some size however all numerous superficial 03/22/18; his weight is stabilized a bit. Just adjustment of his diuretics by his nephrologist. There is no doubt he has some degree of systemic fluid overload on top of severe left greater than right lymphedema. 2 weeks ago tried to transition him to stockings on the right leg however he developed re-breakdown of skin on the right leg and we had to put him back in compression last week. He also uses external compression pumps and claims to be compliant Continued concern about his depression today 03/29/18; several ongoing issues with this patient; He no longer has home health coverage apparently secondary to a lapse in insurance. He is apparently transitioning from short for long-term disability. [facility] was changing his compression wraps on Monday and Friday. He has gained 10 pounds since last week Sees Dr. [name] tomorrow Saw his primary doctor last week about the depression. It sounds as though he declined pharmacologic management. He seems somewhat better today. We were concerned last week when he came. Somewhat better today He is using  his compression pumps once a day at home, I have asked for twice a day if possible especially on the left leg Follows up with his nephrologist in mid-August. He is managing his diuretic for I think stage IV chronic renal failure Paradoxically his wounds actually look better 04/19/18; the patient has not been seen since I last saw him 3 weeks ago. He saw Dr. Delana Meyer of vascular surgery on 03/30/18 I believe he put him in a 20/30 stocking bilaterally with a wraparound extremitease stocking. He has not been putting anything specifically on the wound. More problematic than that he has not been wearing the stockings he is at home. He has been using his external compression pumps once per day according to him on a rare occasion twice On a psychosocial level the patient is now on long-term disability and is applying for COBRA therefore he is between insurances. He has not been able to follow up with Dr. [name] who is his nephrologist as a result. As noted his weight is up to 407 pounds today. He promises me he'll follow-up with Dr. [name] 05/03/18; he hasn't been here in 2 weeks now. He apparently has been wearing a compression sock on the left leg. Massive increase in edema 3 large open wounds on the left anterior leg that were probably blisters. Significant deterioration in the left lateral ankle wound that we've been doing as his most problematic wound. He still does not have his insurance  issues wrapped up. His weight is well over 400 pounds. 05/10/18; arrives today with better looking edema control in the left leg. He has been using his compression pumps twice a day. We also wrapped it is left leg for there he's been using his pumps so there is much better edema control. Most of new wounds from last week look a lot better. Even the refractory area on the lower left lateral ankle looks a lot better to me today. He follows up with Dr. [name] this morning [nephrology] 05/17/18 patient arrives today with a lot  less edema in the left leg. His weight is gone down 7 pounds. He tells me he saw Dr. Helene Kelp, Jaquay A. ([id number]) [name] but his torsemide was not adjusted. He is using the palms 3 times a day. There is been quite an improvement in the remaining wound on the left lateral ankle Weekly visit for follow-up of bilateral lower extremity wounds related to severe lymphedema and probably some degree of systemic fluid overload from chronic renal failure stage IV. His weight is up this week to over 400 pounds. He noted increasing edema in the right leg late last week. He took his compression stocking off he did not increase the frequency of this compression pump use. He developed a large blister on the back of the right calf. He also has a new opened blister on the left lateral calf in addition to the wound that we've been using on the distal left lower calf area and we've been using silver alginate. He tells me that he has an ultrasound which is a DVT rule out and I think a reflux study ordered by Dr. [name] 05/31/18; weekly visit. He went to see vein and vascular this week apparently they remove the 4-layer compression on the left and put an Unna boot on him in replacement. He's got more swelling in the left leg is resolved. That being said his left lower Wound is better. He still has a fairly large wound on the right posterior calf this was not disturbed. He has a follow-up appointment with Dr. Keturah Barre nephrologist next week 06/07/2018; the patient arrives today with a history that he took both his compression wraps off 3 days ago in order to take a shower. He has bilateral severe tense blisters. A lot of weeping erythema especially on the lateral left calf. Almost circumferential blisters on the right. Multiple areas of epithelial breakdown. He does not complain of any pain fever chills. He states he has been using his compression pump in some form of stocking that I could not really determine the type. He  did not come into the clinic with anything on his legs. He tells me he has an appointment with Dr. Merita Norton his nephrologist I believe this Friday. Paradoxically his weight is actually down to 385 pounds I believe last week he was over 400 Objective Constitutional Patient is hypertensive.. Pulse regular and within target range for patient.[name] Respirations regular, non-labored and within target range.. Temperature is normal and within the target range for the patient.[name] appears in no distress. Vitals Time Taken: 12:45 PM, Height: 76 in, Weight: 385.9 lbs, BMI: 47, Temperature: 98.3 F, Pulse: 58 bpm, Respiratory Rate: 18 breaths/min, Blood Pressure: 172/84 mmHg. Eyes Conjunctivae clear. No discharge. Respiratory Respiratory effort is easy and symmetric bilaterally. Rate is normal at rest and on room air.. Bilateral breath sounds are clear and equal in all lobes with no wheezes, rales or rhonchi.. Cardiovascular Heart rhythm and rate regular, without  murmur or gallop. JVP is not elevated. Pedal pulses palpable and strong bilaterally.. Lymphedema left greater than right perhaps slightly worse but not markedly so. Gastrointestinal (GI) Less distended nontender no masses. Genitourinary (GU) No gross bladder distention is noted. Lymphatic None palpable in the popliteal area bilaterally. Psychiatric Braman, [place] ([id number]) Patient appears depressed today.. General Notes: Wound exam The patient's wound that we have been following on the left lateral lower calf has not much changed however he has tense blisters bilaterally especially on the left calf but also almost circumferentially on the right. There was a lot of epithelial loss bilaterally. I opened a lot of these blisters since there was no way that epithelium was going to see a intact. There was absolutely no palpable tenderness. I do not believe this has anything to do with infection Integumentary (Hair, Skin) Wound #15 status is Healed -  Epithelialized. Original cause of wound was Gradually Appeared. The wound is located on the Left,Proximal Lower Leg. The wound measures 0cm length x 0cm width x 0cm depth; 0cm^2 area and 0cm^3 volume. Wound #16 status is Open. Original cause of wound was Gradually Appeared. The wound is located on the Right,Posterior Lower Leg. The wound measures 5.6cm length x 4.7cm width x 0.1cm depth; 20.672cm^2 area and 2.067cm^3 volume. There is no tunneling or undermining noted. There is a large amount of serous drainage noted. There is large (67-100%) granulation within the wound bed. There is no necrotic tissue within the wound bed. The periwound skin appearance exhibited: Hemosiderin Staining. The periwound skin appearance did not exhibit: Callus, Crepitus, Excoriation, Induration, Rash, Scarring, Dry/Scaly, Maceration, Atrophie Blanche, Cyanosis, Ecchymosis, Mottled, Pallor, Rubor, Erythema. Wound #2 status is Open. Original cause of wound was Gradually Appeared. The wound is located on the Left,Lateral,Posterior Lower Leg. The wound measures 1.5cm length x 4.3cm width x 0.4cm depth; 5.066cm^2 area and 2.026cm^3 volume. There is a large amount of serous drainage noted. There is small (1-33%) granulation within the wound bed. There is a large (67-100%) amount of necrotic tissue within the wound bed including Adherent Slough. The periwound skin appearance did not exhibit: Callus, Crepitus, Excoriation, Induration, Rash, Scarring, Dry/Scaly, Maceration, Atrophie Blanche, Cyanosis, Ecchymosis, Hemosiderin Staining, Mottled, Pallor, Rubor, Erythema. Assessment Active Problems ICD-10 Non-pressure chronic ulcer of left calf with necrosis of muscle Lymphedema, not elsewhere classified Chronic venous hypertension (idiopathic) with ulcer and inflammation of left lower extremity Type 2 diabetes mellitus with other skin ulcer Non-pressure chronic ulcer of right calf limited to breakdown of skin Plan Wound  Cleansing: Wound #16 Right,Posterior Lower Leg: Clean wound with Normal Saline. Cleanse wound with mild soap and water Wound #2 Left,Lateral,Posterior Lower Leg: Clean wound with Normal Saline. Cleanse wound with mild soap and water Clean wound with Normal Saline. Cleanse wound with mild soap and water Primary Wound Dressing: Wound #16 Right,Posterior Lower Leg: Spilman, Jailen A. ([id number]) Silver Alginate Wound #2 Left,Lateral,Posterior Lower Leg: Silver Alginate Silver Alginate Secondary Dressing: Wound #16 Right,Posterior Lower Leg: Other - absorbent dressing Wound #2 Left,Lateral,Posterior Lower Leg: Other - absorbent dressing Dressing Change Frequency: Wound #16 Right,Posterior Lower Leg: Change dressing every week Other: - nurse visit if needed Wound #2 Left,Lateral,Posterior Lower Leg: Change dressing every week Other: - nurse visit if needed Follow-up Appointments: Wound #16 Right,Posterior Lower Leg: Return Appointment in 1 week. Nurse Visit as needed Wound #2 Left,Lateral,Posterior Lower Leg: Return Appointment in 1 week. Nurse Visit as needed Edema Control: Wound #16 Right,Posterior Lower Leg: 4 Layer Compression System - Bilateral  Compression Pump: Use compression pump on left lower extremity for 30 minutes, twice daily. - 1 Hour twice Daily Compression Pump: Use compression pump on right lower extremity for 30 minutes, twice daily. - 1 Hour twice daily Wound #2 Left,Lateral,Posterior Lower Leg: 4 Layer Compression System - Bilateral Compression Pump: Use compression pump on left lower extremity for 30 minutes, twice daily. - 1 Hour twice Daily Compression Pump: Use compression pump on right lower extremity for 30 minutes, twice daily. - 1 Hour twice daily 1. A concerning presentation. After careful review I think this patient simply became noncompliant with his compression taking his wraps off several days ago. I am doubtful he had anything on his legs. He  states he was using his compression pumps but I have some doubt about this as well. There was no sign of infection in his legs particularly no palpable tenderness which would make bilateral infection very unlikely. There were no signs of systemic fluid overload and from the waist up he actually looked better in this regard. His abdomen was less distended. It was therefore difficult to blame systemic fluid overload for this problem at least at the bedside although I am somewhat comforted by the fact he is seeing nephrology in 2 days. He tells me he is taking torsemide 40 twice daily. 2. Looked more depressed today than I have seen him in several weeks. 3. Predialysis chronic renal failure. He is apparently being mapped for a shunt. In spite of this is noted it was not obvious that he was simply so systemically fluid overloaded to call the cause this presentation 4. We put him back in 4 layer compression bilaterally asking him to use his compression pumps at least twice a day. Ordinarily I would bring him back for a nurse visit to change the dressings however at least this week the clinic is closed. I told him and this was confirmed by our intake nurse that he could call on Monday if there is a problem. 5. No evidence he has a DVT Electronic Signature(s) Signed: 06/08/2018 7:25:44 AM By: Linton Ham MD Entered By: Linton Ham on 06/08/2018 07:25:44 Clonch, Nickalaus A. ([id number]) -------------------------------------------------------------------------------- SuperBill Details Patient Name: Offenberger, Nikola A. Date of Service: 06/07/2018 Medical Record Number: [id number] Patient Account Number: [id number] Date of Birth/Sex: 1971-07-27 (47 y.o. M) Treating RN: Cornell Barman Primary Care Provider: Gaetano Net Other Clinician: Referring Provider: Gaetano Net Treating Provider/Extender: Tito Dine in Treatment: 23 Diagnosis Coding ICD-10 Codes Code Description [phone] Non-pressure  chronic ulcer of left calf with necrosis of muscle I89.0 Lymphedema, not elsewhere classified I87.332 Chronic venous hypertension (idiopathic) with ulcer and inflammation of left lower extremity E11.622 Type 2 diabetes mellitus with other skin ulcer L97.211 Non-pressure chronic ulcer of right calf limited to breakdown of skin Facility Procedures CPT4: Description Modifier Quantity Code [id number] BILATERAL: Application of multi-layer venous compression system; leg (below 1 knee), including ankle and foot. Physician Procedures CPT4: Description Modifier Quantity Code [id number] - WC PHYS LEVEL 4 - EST PT 1 ICD-10 Diagnosis Description L97.223 Non-pressure chronic ulcer of left calf with necrosis of muscle L97.211 Non-pressure chronic ulcer of right calf limited to breakdown  of skin I89.0 Lymphedema, not elsewhere classified I87.332 Chronic venous hypertension (idiopathic) with ulcer and inflammation of left lower extremity Electronic Signature(s) Signed: 06/08/2018 8:07:08 AM By: Linton Ham MD Previous Signature: 06/07/2018 5:34:51 PM Version By: Gretta Cool, BSN, RN, CWS, Kim RN, BSN Previous Signature: 06/07/2018 6:14:59 PM Version By: Linton Ham MD Entered  By: Linton Ham on 06/08/2018 07:26:11

## 2018-06-10 NOTE — Progress Notes (Signed)
STRYKER, VEASEY ([id number]) Visit Report for 06/07/2018 Arrival Information Details Patient Name: [name], [name] Date of Service: 06/07/2018 12:45 PM Medical Record Number: [id number] Patient Account Number: [id number] Date of Birth/Sex: 1971-04-26 (47 y.o. M) Treating RN: Cornell Barman Primary Care Ruthetta Koopmann: Gaetano Net Other Clinician: Referring Susa Bones: Gaetano Net Treating Donnabelle Blanchard/Extender: Tito Dine in Treatment: 23 Visit Information History Since Last Visit Added or deleted any medications: No Patient Arrived: Ambulatory Any new allergies or adverse reactions: No Arrival Time: 12:43 Had a fall or experienced change in No Accompanied By: self activities of daily living that may affect Transfer Assistance: None risk of falls: Patient Identification Verified: Yes Signs or symptoms of abuse/neglect since last visito No Secondary Verification Process Yes Hospitalized since last visit: No Completed: Implantable device outside of the clinic excluding No Patient Requires Transmission-Based No cellular tissue based products placed in the center Precautions: since last visit: Patient Has Alerts: Yes Has Dressing in Place as Prescribed: Yes Patient Alerts: DM II Pain Present Now: No noncompressible bilateral Electronic Signature(s) Signed: 06/07/2018 4:51:34 PM By: Lorine Bears RCP, RRT, CHT Entered By: Lorine Bears on 06/07/2018 12:47:02 Dinan, Fallou A. ([id number]) -------------------------------------------------------------------------------- Encounter Discharge Information Details Patient Name: [name], [name] Date of Service: 06/07/2018 12:45 PM Medical Record Number: [id number] Patient Account Number: [id number] Date of Birth/Sex: May 23, 1971 (47 y.o. M) Treating RN: Roger Shelter Primary Care Liban Guedes: Gaetano Net Other Clinician: Referring Bynum Mccullars: Gaetano Net Treating Cammi Consalvo/Extender: Tito Dine in  Treatment: 73 Encounter Discharge Information Items Discharge Condition: Stable Ambulatory Status: Ambulatory Discharge Destination: Home Transportation: Private Auto Schedule Follow-up Appointment: Yes Clinical Summary of Care: Electronic Signature(s) Signed: 06/07/2018 2:47:34 PM By: Roger Shelter Entered By: Roger Shelter on 06/07/2018 14:47:34 Rambeau, Julious A. ([id number]) -------------------------------------------------------------------------------- Lower Extremity Assessment Details Patient Name: [name], [name] Date of Service: 06/07/2018 12:45 PM Medical Record Number: [id number] Patient Account Number: [id number] Date of Birth/Sex: 1971/08/25 (47 y.o. M) Treating RN: Secundino Ginger Primary Care Armonie Mettler: Gaetano Net Other Clinician: Referring Merrisa Skorupski: Gaetano Net Treating Eboney Claybrook/Extender: Tito Dine in Treatment: 23 Edema Assessment Assessed: [Left: No] [Right: No] Edema: [Left: Yes] [Right: Yes] Calf Left: Right: Point of Measurement: 36 cm From Medial Instep 65.3 cm 55.4 cm Ankle Left: Right: Point of Measurement: 14 cm From Medial Instep 54.2 cm 40 cm Vascular Assessment Pulses: Dorsalis Pedis Palpable: [Left:Yes] [Right:Yes] Posterior Tibial Extremity colors, hair growth, and conditions: Extremity Color: [Left:Hyperpigmented] [Right:Hyperpigmented] Hair Growth on Extremity: [Left:Yes] [Right:Yes] Temperature of Extremity: [Left:Warm] [Right:Warm] Capillary Refill: [Left:< 3 seconds] [Right:< 3 seconds] Toe Nail Assessment Left: Right: Thick: Yes Yes Discolored: Yes Yes Deformed: Yes Yes Improper Length and Hygiene: Yes Yes Electronic Signature(s) Signed: 06/07/2018 2:30:40 PM By: Secundino Ginger Entered By: Secundino Ginger on 06/07/2018 12:59:05 [name], [name] ([id number]) -------------------------------------------------------------------------------- Multi Wound Chart Details Patient Name: [name], [name] Date of Service: 06/07/2018 12:45  PM Medical Record Number: [id number] Patient Account Number: [id number] Date of Birth/Sex: [birth year] (47 y.o. M) Treating RN: Cornell Barman Primary Care Rohith Fauth: Gaetano Net Other Clinician: Referring Palmyra Rogacki: Gaetano Net Treating Whitfield Dulay/Extender: Tito Dine in Treatment: 23 Vital Signs Height(in): 76 Pulse(bpm): 58 Weight(lbs): 385.9 Blood Pressure(mmHg): 172/84 Body Mass Index(BMI): 47 Temperature(F): 98.3 Respiratory Rate 18 (breaths/min): Photos: Wound Location: Left, Proximal Lower Leg Right Lower Leg - Posterior Left Lower Leg - Lateral, Posterior Wounding Event: Gradually Appeared Gradually Appeared Gradually Appeared Primary Etiology: Lymphedema Lymphedema Diabetic Wound/Ulcer of the Lower Extremity Secondary Etiology: N/A N/A Lymphedema Comorbid History: N/A  Hypertension, Type II Hypertension, Type II Diabetes, Neuropathy Diabetes, Neuropathy Date Acquired: 05/24/2018 05/20/2018 09/30/2017 Weeks of Treatment: 2 2 23  Wound Status: Healed - Epithelialized Open Open Measurements L x W x D 0x0x0 5.6x4.7x0.1 1.5x4.3x0.4 (cm) Area (cm) : 0 20.672 5.066 Volume (cm) : 0 2.067 2.026 % Reduction in Area: 100.00% 32.50% 82.10% % Reduction in Volume: 100.00% 32.50% 82.10% Classification: Partial Thickness Full Thickness Without Grade 2 Exposed Support Structures Exudate Amount: N/A Large Large Exudate Type: N/A Serous Serous Exudate Color: N/A amber amber Granulation Amount: N/A Large (67-100%) Small (1-33%) Necrotic Amount: N/A None Present (0%) Large (67-100%) Periwound Skin Texture: No Abnormalities Noted Excoriation: No Excoriation: No Induration: No Induration: No Callus: No Callus: No Crepitus: No Crepitus: No [name], [name] ([id number]) Rash: No Rash: No Scarring: No Scarring: No Periwound Skin Moisture: No Abnormalities Noted Maceration: No Maceration: No Dry/Scaly: No Dry/Scaly: No Periwound Skin Color: No Abnormalities  Noted Hemosiderin Staining: Yes Atrophie Blanche: No Atrophie Blanche: No Cyanosis: No Cyanosis: No Ecchymosis: No Ecchymosis: No Erythema: No Erythema: No Hemosiderin Staining: No Mottled: No Mottled: No Pallor: No Pallor: No Rubor: No Rubor: No Tenderness on Palpation: No No No Wound Preparation: N/A Ulcer Cleansing: Ulcer Cleansing: Rinsed/Irrigated with Saline Rinsed/Irrigated with Saline Topical Anesthetic Applied: Topical Anesthetic Applied: Other: lidocaine 4% Other: lidocaine 4% Treatment Notes Wound #16 (Right, Posterior Lower Leg) 1. Cleansed with: Clean wound with Normal Saline 4. Dressing Applied: Other dressing (specify in notes) 5. Secondary Dressing Applied ABD Pad 7. Secured with 4 Layer Compression System - Bilateral Notes silvercell, abd and kerramax Wound #2 (Left, Lateral, Posterior Lower Leg) 1. Cleansed with: Clean wound with Normal Saline 4. Dressing Applied: Other dressing (specify in notes) 5. Secondary Dressing Applied ABD Pad 7. Secured with 4 Layer Compression System - Bilateral Notes silvercell, abd and Air cabin crew) Signed: 06/08/2018 8:07:08 AM By: Linton Ham MD Previous Signature: 06/07/2018 5:34:51 PM Version By: Gretta Cool, BSN, RN, CWS, Kim RN, BSN Entered By: Linton Ham on 06/08/2018 07:16:19 [place], [place] AMarland Kitchen ([id number]) -------------------------------------------------------------------------------- Multi-Disciplinary Care Plan Details Patient Name: Kempfer, Shunsuke A. Date of Service: 06/07/2018 12:45 PM Medical Record Number: [id number] Patient Account Number: [id number] Date of Birth/Sex: 03-16-1971 (47 y.o. M) Treating RN: Cornell Barman Primary Care Dailynn Nancarrow: Gaetano Net Other Clinician: Referring Gabriel Paulding: Gaetano Net Treating Mamta Rimmer/Extender: Tito Dine in Treatment: 55 Active Inactive ` Abuse / Safety / Falls / Self Care Management Nursing Diagnoses: Potential for  falls Goals: Patient will remain injury free related to falls Date Initiated: 12/28/2017 Target Resolution Date: 04/08/2018 Goal Status: Active Interventions: Assess fall risk on admission and as needed Notes: ` Nutrition Nursing Diagnoses: Impaired glucose control: actual or potential Goals: Patient/caregiver verbalizes understanding of need to maintain therapeutic glucose control per primary care physician Date Initiated: 12/28/2017 Target Resolution Date: 04/08/2018 Goal Status: Active Interventions: Provide education on elevated blood sugars and impact on wound healing Notes: ` Orientation to the Wound Care Program Nursing Diagnoses: Knowledge deficit related to the wound healing center program Goals: Patient/caregiver will verbalize understanding of the [place] Program Date Initiated: 12/28/2017 Target Resolution Date: 04/08/2018 Goal Status: Active Interventions: Suber, Lawernce A. ([id number]) Provide education on orientation to the wound center Notes: ` Venous Leg Ulcer Nursing Diagnoses: Knowledge deficit related to disease process and management Goals: Patient will maintain optimal edema control Date Initiated: 03/27/2018 Target Resolution Date: 04/27/2018 Goal Status: Active Interventions: Assess peripheral edema status every visit. Compression as ordered Treatment Activities: Therapeutic compression applied : 03/22/2018  Notes: ` Wound/Skin Impairment Nursing Diagnoses: Impaired tissue integrity Goals: Ulcer/skin breakdown will heal within 14 weeks Date Initiated: 12/28/2017 Target Resolution Date: 04/08/2018 Goal Status: Active Interventions: Assess patient/caregiver ability to obtain necessary supplies Assess patient/caregiver ability to perform ulcer/skin care regimen upon admission and as needed Assess ulceration(s) every visit Notes: Electronic Signature(s) Signed: 06/07/2018 5:34:51 PM By: Gretta Cool, BSN, RN, CWS, Kim RN, BSN Entered By: Gretta Cool,  BSN, RN, CWS, Kim on 06/07/2018 13:15:49 Buttram, Kemp A. ([id number]) -------------------------------------------------------------------------------- Non-Wound Condition Assessment Details Patient Name: Pocius, Cliffard A. Date of Service: 06/07/2018 12:45 PM Medical Record Number: [id number] Patient Account Number: [id number] Date of Birth/Sex: 01-01-1971 (47 y.o. M) Treating RN: Cornell Barman Primary Care Daemion Mcniel: Gaetano Net Other Clinician: Referring Deshunda Thackston: Gaetano Net Treating Norell Brisbin/Extender: Tito Dine in Treatment: 23 Non-Wound Condition: Condition: Lymphedema Location: Leg Side: Bilateral Photos Periwound Skin Texture Texture Color No Abnormalities Noted: No No Abnormalities Noted: No Callus: No Atrophie Blanche: No Crepitus: No Cyanosis: No Excoriation: No Ecchymosis: No Friable: No Erythema: No Induration: Yes Hemosiderin Staining: Yes Rash: No Mottled: No Scarring: No Pallor: No Rubor: No Moisture No Abnormalities Noted: No Dry / Scaly: No Maceration: No Notes Multiple Blisters bilaterally Electronic Signature(s) Signed: 06/07/2018 2:30:40 PM By: Secundino Ginger Signed: 06/07/2018 5:34:51 PM By: Gretta Cool, BSN, RN, CWS, Kim RN, BSN Entered By: Secundino Ginger on 06/07/2018 13:21:56 Winberg, Taijon A. ([id number]) -------------------------------------------------------------------------------- Pain Assessment Details Patient Name: Huron, Aldair A. Date of Service: 06/07/2018 12:45 PM Medical Record Number: [id number] Patient Account Number: [id number] Date of Birth/Sex: 23-Dec-1970 (47 y.o. M) Treating RN: Cornell Barman Primary Care Jeanenne Licea: Gaetano Net Other Clinician: Referring Leanora Murin: Gaetano Net Treating Kenlea Woodell/Extender: Tito Dine in Treatment: 23 Active Problems Location of Pain Severity and Description of Pain Patient Has Paino No Site Locations Pain Management and Medication Current Pain Management: Electronic  Signature(s) Signed: 06/07/2018 4:51:34 PM By: Lorine Bears RCP, RRT, CHT Signed: 06/07/2018 5:34:51 PM By: Gretta Cool, BSN, RN, CWS, Kim RN, BSN Entered By: Lorine Bears on 06/07/2018 12:47:10 Even, Teyton AMarland Kitchen ([id number]) -------------------------------------------------------------------------------- Patient/Caregiver Education Details Patient Name: Strack, Quentyn A. Date of Service: 06/07/2018 12:45 PM Medical Record Number: [id number] Patient Account Number: [id number] Date of Birth/Gender: 20-Apr-1971 (47 y.o. M) Treating RN: Cornell Barman Primary Care Physician: Gaetano Net Other Clinician: Referring Physician: Gaetano Net Treating Physician/Extender: Tito Dine in Treatment: 15 Education Assessment Education Provided To: Patient Education Topics Provided Venous: Handouts: Controlling Swelling with Compression Stockings Methods: Demonstration, Explain/Verbal Responses: State content correctly Wound/Skin Impairment: Handouts: Caring for Your Ulcer Methods: Demonstration, Explain/Verbal Responses: State content correctly Electronic Signature(s) Signed: 06/07/2018 2:47:49 PM By: Roger Shelter Entered By: Roger Shelter on 06/07/2018 14:47:38 Mauger, Levi A. ([id number]) -------------------------------------------------------------------------------- Wound Assessment Details Patient Name: Uselman, Tenoch A. Date of Service: 06/07/2018 12:45 PM Medical Record Number: [id number] Patient Account Number: [id number] Date of Birth/Sex: 1971-05-06 (48 y.o. M) Treating RN: Secundino Ginger Primary Care Eugen Jeansonne: Gaetano Net Other Clinician: Referring Kinston Magnan: Gaetano Net Treating Luanne Krzyzanowski/Extender: Tito Dine in Treatment: 23 Wound Status Wound Number: 15 Primary Etiology: Lymphedema Wound Location: Left, Proximal Lower Leg Wound Status: Healed - Epithelialized Wounding Event: Gradually Appeared Date Acquired: 05/24/2018 Weeks Of  Treatment: 2 Clustered Wound: No Photos Photo Uploaded By: Secundino Ginger on 06/07/2018 13:20:14 Wound Measurements Length: (cm) 0 Width: (cm) 0 Depth: (cm) 0 Area: (cm) 0 Volume: (cm) 0 % Reduction in Area: 100% % Reduction in Volume: 100% Wound Description Classification: Partial Thickness Periwound Skin Texture Texture Color No Abnormalities  Noted: No No Abnormalities Noted: No Moisture No Abnormalities Noted: No Electronic Signature(s) Signed: 06/07/2018 2:30:40 PM By: Secundino Ginger Entered By: Secundino Ginger on 06/07/2018 13:00:33 Fluegge, Rihaan A. ([id number]) -------------------------------------------------------------------------------- Wound Assessment Details Patient Name: Mom, Jahlen A. Date of Service: 06/07/2018 12:45 PM Medical Record Number: [id number] Patient Account Number: [id number] Date of Birth/Sex: 1971/03/21 (47 y.o. M) Treating RN: Secundino Ginger Primary Care Jannely Henthorn: Gaetano Net Other Clinician: Referring Ashkan Chamberland: Gaetano Net Treating Quanta Robertshaw/Extender: Tito Dine in Treatment: 23 Wound Status Wound Number: 16 Primary Etiology: Lymphedema Wound Location: Right Lower Leg - Posterior Wound Status: Open Wounding Event: Gradually Appeared Comorbid Hypertension, Type II Diabetes, History: Neuropathy Date Acquired: 05/20/2018 Weeks Of Treatment: 2 Clustered Wound: No Photos Photo Uploaded By: Secundino Ginger on 06/07/2018 13:20:15 Wound Measurements Length: (cm) 5.6 Width: (cm) 4.7 Depth: (cm) 0.1 Area: (cm) 20.672 Volume: (cm) 2.067 % Reduction in Area: 32.5% % Reduction in Volume: 32.5% Tunneling: No Undermining: No Wound Description Full Thickness Without Exposed Support Classification: Structures Exudate Large Amount: Exudate Type: Serous Exudate Color: amber Foul Odor After Cleansing: No Slough/Fibrino No Wound Bed Granulation Amount: Large (67-100%) Exposed Structure Necrotic Amount: None Present (0%) Fascia Exposed: No Fat  Layer (Subcutaneous Tissue) Exposed: No Tendon Exposed: No Muscle Exposed: No Joint Exposed: No Bone Exposed: No Periwound Skin Texture Crumby, Madeline A. ([id number]) Texture Color No Abnormalities Noted: No No Abnormalities Noted: No Callus: No Atrophie Blanche: No Crepitus: No Cyanosis: No Excoriation: No Ecchymosis: No Induration: No Erythema: No Rash: No Hemosiderin Staining: Yes Scarring: No Mottled: No Pallor: No Moisture Rubor: No No Abnormalities Noted: No Dry / Scaly: No Maceration: No Wound Preparation Ulcer Cleansing: Rinsed/Irrigated with Saline Topical Anesthetic Applied: Other: lidocaine 4%, Treatment Notes Wound #16 (Right, Posterior Lower Leg) 1. Cleansed with: Clean wound with Normal Saline 4. Dressing Applied: Other dressing (specify in notes) 5. Secondary Dressing Applied ABD Pad 7. Secured with 4 Layer Compression System - Bilateral Notes silvercell, abd and kerramax Electronic Signature(s) Signed: 06/07/2018 2:30:40 PM By: Secundino Ginger Entered By: Secundino Ginger on 06/07/2018 13:02:09 Abundis, Jacob A. ([id number]) -------------------------------------------------------------------------------- Wound Assessment Details Patient Name: Don, Lawayne A. Date of Service: 06/07/2018 12:45 PM Medical Record Number: [id number] Patient Account Number: [id number] Date of Birth/Sex: 08-16-1971 (47 y.o. M) Treating RN: Secundino Ginger Primary Care Etola Mull: Gaetano Net Other Clinician: Referring Ramiel Forti: Gaetano Net Treating Shanequia Kendrick/Extender: Tito Dine in Treatment: 23 Wound Status Wound Number: 2 Primary Etiology: Diabetic Wound/Ulcer of the Lower Extremity Wound Location: Left Lower Leg - Lateral, Posterior Secondary Lymphedema Wounding Event: Gradually Appeared Etiology: Date Acquired: 09/30/2017 Wound Status: Open Weeks Of Treatment: 23 Comorbid History: Hypertension, Type II Diabetes, Clustered Wound: No Neuropathy Photos Photo Uploaded  By: Secundino Ginger on 06/07/2018 13:20:50 Wound Measurements Length: (cm) 1.5 Width: (cm) 4.3 Depth: (cm) 0.4 Area: (cm) 5.066 Volume: (cm) 2.026 % Reduction in Area: 82.1% % Reduction in Volume: 82.1% Wound Description Classification: Grade 2 Exudate Amount: Large Exudate Type: Serous Exudate Color: amber Foul Odor After Cleansing: No Slough/Fibrino Yes Wound Bed Granulation Amount: Small (1-33%) Exposed Structure Necrotic Amount: Large (67-100%) Fascia Exposed: No Necrotic Quality: Adherent Slough Fat Layer (Subcutaneous Tissue) Exposed: No Tendon Exposed: No Muscle Exposed: No Joint Exposed: No Bone Exposed: No Periwound Skin Texture Texture Color Arkwright, Dayshaun A. ([id number]) No Abnormalities Noted: No No Abnormalities Noted: No Callus: No Atrophie Blanche: No Crepitus: No Cyanosis: No Excoriation: No Ecchymosis: No Induration: No Erythema: No Rash: No Hemosiderin Staining: No Scarring: No Mottled: No Pallor:  No Moisture Rubor: No No Abnormalities Noted: No Dry / Scaly: No Maceration: No Wound Preparation Ulcer Cleansing: Rinsed/Irrigated with Saline Topical Anesthetic Applied: Other: lidocaine 4%, Treatment Notes Wound #2 (Left, Lateral, Posterior Lower Leg) 1. Cleansed with: Clean wound with Normal Saline 4. Dressing Applied: Other dressing (specify in notes) 5. Secondary Dressing Applied ABD Pad 7. Secured with 4 Layer Compression System - Bilateral Notes silvercell, abd and kerramax Electronic Signature(s) Signed: 06/07/2018 2:30:40 PM By: Secundino Ginger Entered By: Secundino Ginger on 06/07/2018 13:03:40 Tracz, Cashel A. ([id number]) -------------------------------------------------------------------------------- Vitals Details Patient Name: Babineau, Troy A. Date of Service: 06/07/2018 12:45 PM Medical Record Number: [id number] Patient Account Number: [id number] Date of Birth/Sex: October 10, 1970 (47 y.o. M) Treating RN: Cornell Barman Primary Care Eriyah Fernando:  Gaetano Net Other Clinician: Referring Cornesha Radziewicz: Gaetano Net Treating Josua Ferrebee/Extender: Tito Dine in Treatment: 23 Vital Signs Time Taken: 12:45 Temperature (F): 98.3 Height (in): 76 Pulse (bpm): 58 Weight (lbs): 385.9 Respiratory Rate (breaths/min): 18 Body Mass Index (BMI): 47 Blood Pressure (mmHg): 172/84 Reference Range: 80 - 120 mg / dl Electronic Signature(s) Signed: 06/07/2018 4:51:34 PM By: Lorine Bears RCP, RRT, CHT Entered By: Becky Sax, Amado Nash on 06/07/2018 12:49:07

## 2018-06-14 ENCOUNTER — Encounter: Payer: BLUE CROSS/BLUE SHIELD | Admitting: Internal Medicine

## 2018-06-14 DIAGNOSIS — E11622 Type 2 diabetes mellitus with other skin ulcer: Secondary | ICD-10-CM | POA: Diagnosis not present

## 2018-06-16 NOTE — Progress Notes (Signed)
DEYTON, ELLENBECKER ([id number]) Visit Report for 06/14/2018 HPI Details Patient Name: [name], [name] Date of Service: 06/14/2018 1:30 PM Medical Record Number: [id number] Patient Account Number: [id number] Date of Birth/Sex: 17-Nov-1970 (47 y.o. M) Treating RN: Cornell Barman Primary Care Provider: Gaetano Net Other Clinician: Referring Provider: Gaetano Net Treating Provider/Extender: Tito Dine in Treatment: 24 History of Present Illness HPI Description: 12/28/17; this is a now 47 year old man who is a type II diabetic. He was hospitalized from 10/01/17 through 10/19/17. He had an MSSA soft tissue and skin infection. 2 open areas on the left leg were identified he has a smaller area on the left medial calf superiorly just below the knee and a wound just above the left ankle on the posterior medial aspect. I think both of these were surgical IandD sites when he was in the hospital. He was discharged with a wound VAC at that point however this is since been taken off. He follows with Dr. Ola Spurr for the [facility] and he is still on chronic Keflex at 500 twice a day. At that time he was hospitalized his hemoglobin A1c was 15.1 however if I'm reading his endocrinologist notes correctly that is improved. He has been following with Dr. [name] at vein and vascular and he has been applying calcium alginate and Unna boots. He has home health changing the dressing. They have also been attempting to get him external compression pumps although the patient is unaware whether they've been approved by insurance at this point. as mentioned he has a smaller clean wound on the right lateral calf just below the knee and he has a much larger area just above the left ankle medially and posteriorly. Our intake nurse reported greenish purulent looking drainage.the patient did have surgical material sent to pathology in February. This showed chronic abscess The patient also has lymphedema stage III in the left  greater than right lower extremities. He has a history of blisters with wounds but these of all were always healed. The patient thinks that the lymphedema may have been present since he was about 47 years old i.e. about 30 years. He does not have graded pressure stockings and has not worn stockings. He does not have a distant history of DVT PE or phlebitis. He has not been systemically unwell fever no chills. He states that his Lasix is recently been reduced. He tells me his kidney function is at "30%" and he has been followed by Dr. [name] of nephrology. At one point he was on Lasix 80 twice a day however that's been cut back and he is now on Lasix at 20 twice a day. The patient has a history of PAD listed in his records although he comes from Dr. [name] I don't think is felt to have significant PAD. ABIs in our clinic were noncompressible bilaterally. 01/04/18; patient has a large wound on the left lateral lower calf and a small wound on the left medial upper calf. He has been to see his nephrologist who changed him to Demadex 40 mg a day. I'm hopeful this will help with his systemic fluid overload. He also has stage III lymphedema. Really no change in the 2 wounds since last week 01/11/18; the patient is down 13 pounds. He put his stage III lymphedema left leg in 4K compression last week and there is less edema fluid however we still haven't been able to communicate with home health but [facility] it is [facility] but the dressings have not been changed. The  patient noted an odor last week. He is also had compression pumps ordered by [facility] this as a not completed the paperwork stage. 01/18/18; patient continues to lose weight. Stage III lymphedema in the left greater than right leg under for alert compression. The major wound is on the left lateral ankle area. He apparently has bilateral compression pumps being brought to his house, these were ordered by [facility] Notable for the fact today he had some blisters on the right anterior leg together with some skin nodules. This is no doubt secondary to severe lymphedema. 01/25/18; the patient has obtained his compression pumps and is using them per vein and vascular instructions 3 times a day for an hour. He also saw [name] of vein and vascular. He was felt to have venous insufficiency but did not suggest any intervention also improved edema. It was suggested that he have compression stockings 20-30 mm on a daily basis in addition to compression pumps. Troutman, Paton A. ([id number]) The patient arrives in clinic today with a layer of unna under for layer compression. he seems to have some trouble with the degree of compression. He has open areas on the left lateral ankle area which is his major wound left upper medial calf and a superficial open area on the right anterior shin area which was blistered last week. He has skin changes on the right anterior calf which I think are no doubt secondary to lymphedema skin nodules etc. 02/01/18; the patient comes in telling us his nephrologist have to his torsemide. Unfortunately today he is put on 7 pounds by our scales. He has blisters all over the anterior and medial part of his right calf and a new open wound. He also has soupy green drainage coming out of the left lateral calf /ankle wound. 02/08/18; culture I did last week of the left lateral ankle wound grew both Pseudomonas and Morganella. He is on Keflex from Dr. Ola Spurr in the hospital. I will need to review these notes.in any case Keflex is not going to cover these 2 organisms. I'm probably going to added ciprofloxacin today for 1 week. A lot of drainage that looks purulent last week. He is not complaining of pain however he has managed to put on 10 pounds in 2 weeks by our scales in this clinic. He is going to see his nephrologist tomorrow 02/15/18; he completed the ciprofloxacin I gave him last week.  Notable that he is up to 379 pounds today which is up 16 pounds from 2 weeks ago. He is complaining of orthopnea but doesn't have any chest pain. 02/22/18; he continues to have weight gain. R intake nurse reports again purulent green drainage coming out of the lateral wound on the lateral left calf. He has small open area on the right anterior leg. His torsemide was increased to 2 tablets a day I believe this is 40 mg last week in response to the call admitted to Dr. Keturah Barre office. He follows up with Dr. [name] and Dr. Delana Meyer tomorrow 03/01/18 his weight essentially stable today at 383 pounds. Drainage out of the left lateral wound on the lower left calf/ankle is a lot less. Culture last time grew Pseudomonas. I put him on cefdinir. He has been to Dr. Keturah Barre office no adjustments in his diuretics. Dr. Nicoletta Dress prescribed a wraparound stocking for the right leg.there is no open area on the right leg. He has a superficial area on the left medial calf, left posterior  calf and in the large area on the left lateral however this looks better 03/15/18; weight is not up to 393 pounds. He saw his nephrologist yesterday Dr. [name] will increase the Demadex I'm hopeful this will help with the edema control. I'm using silver alginate to all his wounds. In particular the left lateral ankle looks better. oUnfortunately he has new open areas on the right lateral calf that will include use of his compression stocking at least in the short to medium term. He has new wounds o3 on the right lateral calf. One of these has some size however all numerous superficial 03/22/18; his weight is stabilized a bit. Just adjustment of his diuretics by his nephrologist. There is no doubt he has some degree of systemic fluid overload on top of severe left greater than right lymphedema. 2 weeks ago tried to transition him to stockings on the right leg however he developed re-breakdown of skin on the right leg and we had to put him back  in compression last week. He also uses external compression pumps and claims to be compliant Continued concern about his depression today 03/29/18; several ongoing issues with this patient; oHe no longer has home health coverage apparently secondary to a lapse in insurance. He is apparently transitioning from short for long-term disability. [facility] was changing his compression wraps on Monday and Friday. oHe has gained 10 pounds since last week oSees Dr. [name] tomorrow oSaw his primary doctor last week about the depression. It sounds as though he declined pharmacologic management. He seems somewhat better today. We were concerned last week when he came. Somewhat better today oHe is using his compression pumps once a day at home, I have asked for twice a day if possible especially on the left leg oFollows up with his nephrologist in mid-August. He is managing his diuretic for I think stage IV chronic renal failure oParadoxically his wounds actually look better 04/19/18; the patient has not been seen since I last saw him 3 weeks ago. He saw Dr. Delana Meyer of vascular surgery on 03/30/18 I believe he put him in a 20/30 stocking bilaterally with a wraparound extremitease stocking. He has not been putting anything specifically on the wound. More problematic than that he has not been wearing the stockings he is at home. He has been using his external compression pumps once per day according to him on a rare occasion twice On a psychosocial level the patient is now on long-term disability and is applying for COBRA therefore he is between insurances. He has not been able to follow up with Dr. [name] who is his nephrologist as a result. As noted his weight is up to 407 pounds today. He promises me he'll follow-up with Dr. [name] 05/03/18; he hasn't been here in 2 weeks now. He apparently has been wearing a compression sock on the left leg. Massive increase in edema 3 large open wounds on the left anterior  leg that were probably blisters. Significant deterioration in the left lateral ankle wound that we've been doing as his most problematic wound. He still does not have his insurance issues wrapped up. His weight is well over 400 pounds. 05/10/18; arrives today with better looking edema control in the left leg. He has been using his compression pumps twice a day. We also wrapped it is left leg for there he's been using his pumps so there is much better edema control. Most of new wounds from last week look a lot better. Even the  refractory area on the lower left lateral ankle looks a lot better to me today. Dasaro, Januel A. ([id number]) He follows up with Dr. [name] this morning [nephrology] 05/17/18 patient arrives today with a lot less edema in the left leg. His weight is gone down 7 pounds. He tells me he saw Dr. [name] but his torsemide was not adjusted. He is using the palms 3 times a day. There is been quite an improvement in the remaining wound on the left lateral ankle oWeekly visit for follow-up of bilateral lower extremity wounds related to severe lymphedema and probably some degree of systemic fluid overload from chronic renal failure stage IV. His weight is up this week to over 400 pounds. He noted increasing edema in the right leg late last week. He took his compression stocking off he did not increase the frequency of this compression pump use. He developed a large blister on the back of the right calf. He also has a new opened blister on the left lateral calf in addition to the wound that we've been using on the distal left lower calf area and we've been using silver alginate. He tells me that he has an ultrasound which is a DVT rule out and I think a reflux study ordered by Dr. [name] 05/31/18; weekly visit. He went to see vein and vascular this week apparently they remove the 4-layer compression on the left and put an Unna boot on him in replacement. He's got more swelling in the left leg  is resolved. That being said his left lower Wound is better. He still has a fairly large wound on the right posterior calf this was not disturbed. He has a follow-up appointment with Dr. Keturah Barre nephrologist next week 06/07/2018; the patient arrives today with a history that he took both his compression wraps off 3 days ago in order to take a shower. He has bilateral severe tense blisters. A lot of weeping erythema especially on the lateral left calf. Almost circumferential blisters on the right. Multiple areas of epithelial breakdown. He does not complain of any pain fever chills. He states he has been using his compression pump in some form of stocking that I could not really determine the type. He did not come into the clinic with anything on his legs. He tells me he has an appointment with Dr. Merita Norton his nephrologist I believe this Friday. Paradoxically his weight is actually down to 385 pounds I believe last week he was over 400 06/14/18; arrives with better looking wound surfaces today on both legs. There is no further blistering however there are still areas that aren't epithelialized on the right anterior, right lateral, left posterior and left medial. His original wound just above the left ankle laterally is still open moist. His weight was about the same today. He tells me that his nephrologist increase the torsemide from 40/20/09/1939/40 twice a day Electronic Signature(s) Signed: 06/14/2018 4:50:13 PM By: Linton Ham MD Entered By: Linton Ham on 06/14/2018 15:07:49 Ankney, Dimitriy A. ([id number]) -------------------------------------------------------------------------------- Physical Exam Details Patient Name: Illes, Corry A. Date of Service: 06/14/2018 1:30 PM Medical Record Number: [id number] Patient Account Number: [id number] Date of Birth/Sex: 12/22/1970 (47 y.o. M) Treating RN: Cornell Barman Primary Care Provider: Gaetano Net Other Clinician: Referring Provider: Gaetano Net Treating Provider/Extender: Tito Dine in Treatment: 24 Constitutional Patient is hypertensive.. Pulse regular and within target range for patient.[name] Respirations regular, non-labored and within target range.. Temperature is normal and within the target range for the  patient.[name] appears in no distress. Eyes Conjunctivae clear. No discharge. Respiratory Respiratory effort is easy and symmetric bilaterally. Rate is normal at rest and on room air.. Cardiovascular Extreme lymphedema in the left calf greater than right calf although I think this is about stable for him. Certainly not as bad as last week.. Integumentary (Hair, Skin) Other than lymphedema no primary skin issues are seen. Psychiatric Mood appears stable. Notes Wound exam oThe patient's wounds on the right calf look better. All the blisters are fully unroofed. He has areas on the right anterior mid calf/tibial area and right lateral calf there is still not fully epithelialized. oHe has areas of weeping edema left Posteriorly and medially. oHis original wound just above the left ankle looks about the same it is coming and really nicely over it as a macerated base very wet. It is in a fold of lymphedematous tissue Electronic Signature(s) Signed: 06/14/2018 4:50:13 PM By: Linton Ham MD Entered By: Linton Ham on 06/14/2018 15:11:00 Hicklin, Jaymason A. ([id number]) -------------------------------------------------------------------------------- Physician Orders Details Patient Name: Novack, Finnley A. Date of Service: 06/14/2018 1:30 PM Medical Record Number: [id number] Patient Account Number: [id number] Date of Birth/Sex: 1971/06/01 (47 y.o. M) Treating RN: Cornell Barman Primary Care Provider: Gaetano Net Other Clinician: Referring Provider: Gaetano Net Treating Provider/Extender: Tito Dine in Treatment: 8 Verbal / Phone Orders: No Diagnosis Coding Wound Cleansing Wound #2  Left,Lateral,Posterior Lower Leg o Clean wound with Normal Saline. o Clean wound with Normal Saline. o Cleanse wound with mild soap and water o Cleanse wound with mild soap and water Anesthetic (add to Medication List) Wound #2 Left,Lateral,Posterior Lower Leg o Topical Lidocaine 4% cream applied to wound bed prior to debridement (In Clinic Only). Primary Wound Dressing o Other: - Bilateral Legs - weeping areas Wound #2 Left,Lateral,Posterior Lower Leg o Silver Alginate o Silver Alginate Secondary Dressing Wound #2 Left,Lateral,Posterior Lower Leg o Other - absorbent dressing on all weeping areas Dressing Change Frequency Wound #2 Left,Lateral,Posterior Lower Leg o Change dressing every week o Other: - nurse visit if needed Follow-up Appointments Wound #2 Left,Lateral,Posterior Lower Leg o Return Appointment in 1 week. o Nurse Visit as needed Edema Control Wound #2 Left,Lateral,Posterior Lower Leg o 4 Layer Compression System - Bilateral o Compression Pump: Use compression pump on left lower extremity for 30 minutes, twice daily. - 1 Hour twice Daily o Compression Pump: Use compression pump on right lower extremity for 30 minutes, twice daily. - 1 Hour twice daily Electronic Signature(s) JAELON, GATLEY ([id number]) Signed: 06/14/2018 4:50:13 PM By: Linton Ham MD Signed: 06/14/2018 5:17:02 PM By: Gretta Cool, BSN, RN, CWS, Kim RN, BSN Entered By: Gretta Cool, BSN, RN, CWS, Kim on 06/14/2018 13:51:04 Kincannon, Yassin AMarland Kitchen ([id number]) -------------------------------------------------------------------------------- Problem List Details Patient Name: Schillaci, Herbert A. Date of Service: 06/14/2018 1:30 PM Medical Record Number: [id number] Patient Account Number: [id number] Date of Birth/Sex: 07-Jun-1971 (47 y.o. M) Treating RN: Cornell Barman Primary Care Provider: Gaetano Net Other Clinician: Referring Provider: Gaetano Net Treating Provider/Extender: Tito Dine in Treatment: 24 Active Problems ICD-10 Evaluated Encounter Code Description Active Date Today Diagnosis L97.223 Non-pressure chronic ulcer of left calf with necrosis of muscle 12/28/2017 No Yes I89.0 Lymphedema, not elsewhere classified 12/28/2017 No Yes I87.332 Chronic venous hypertension (idiopathic) with ulcer and 12/28/2017 No Yes inflammation of left lower extremity E11.622 Type 2 diabetes mellitus with other skin ulcer 12/28/2017 No Yes L97.211 Non-pressure chronic ulcer of right calf limited to breakdown 02/22/2018 No Yes of skin Inactive Problems Resolved Problems  Electronic Signature(s) Signed: 06/14/2018 4:50:13 PM By: Linton Ham MD Entered By: Linton Ham on 06/14/2018 Woodruff, Sanborn. ([id number]) -------------------------------------------------------------------------------- Progress Note Details Patient Name: Kemp, Deondra A. Date of Service: 06/14/2018 1:30 PM Medical Record Number: [id number] Patient Account Number: [id number] Date of Birth/Sex: 1970-12-12 (47 y.o. M) Treating RN: Cornell Barman Primary Care Provider: Gaetano Net Other Clinician: Referring Provider: Gaetano Net Treating Provider/Extender: Tito Dine in Treatment: 24 Subjective History of Present Illness (HPI) 12/28/17; this is a now 47 year old man who is a type II diabetic. He was hospitalized from 10/01/17 through 10/19/17. He had an MSSA soft tissue and skin infection. 2 open areas on the left leg were identified he has a smaller area on the left medial calf superiorly just below the knee and a wound just above the left ankle on the posterior medial aspect. I think both of these were surgical IandD sites when he was in the hospital. He was discharged with a wound VAC at that point however this is since been taken off. He follows with Dr. Ola Spurr for the [facility] and he is still on chronic Keflex at 500 twice a day. At that time he was hospitalized his hemoglobin  A1c was 15.1 however if I'm reading his endocrinologist notes correctly that is improved. He has been following with Dr. [name] at [facility] and he has been applying calcium alginate and Unna boots. He has home health changing the dressing. They have also been attempting to get him external compression pumps although the patient is unaware whether they've been approved by insurance at this point. as mentioned he has a smaller clean wound on the right lateral calf just below the knee and he has a much larger area just above the left ankle medially and posteriorly. Our intake nurse reported greenish purulent looking drainage.the patient did have surgical material sent to pathology in February. This showed chronic abscess The patient also has lymphedema stage III in the left greater than right lower extremities. He has a history of blisters with wounds but these of all were always healed. The patient thinks that the lymphedema may have been present since he was about 47 years old i.e. about 30 years. He does not have graded pressure stockings and has not worn stockings. He does not have a distant history of DVT PE or phlebitis. He has not been systemically unwell fever no chills. He states that his Lasix is recently been reduced. He tells me his kidney function is at "30%" and he has been followed by Dr. [name] of nephrology. At one point he was on Lasix 80 twice a day however that's been cut back and he is now on Lasix at 20 twice a day. The patient has a history of PAD listed in his records although he comes from Dr. [name] I don't think is felt to have significant PAD. ABIs in our clinic were noncompressible bilaterally. 01/04/18; patient has a large wound on the left lateral lower calf and a small wound on the left medial upper calf. He has been to see his nephrologist who changed him to Demadex 40 mg a day. I'm hopeful this will help with his systemic fluid overload. He also has stage III  lymphedema. Really no change in the 2 wounds since last week 01/11/18; the patient is down 13 pounds. He put his stage III lymphedema left leg in 4K compression last week and there is less edema fluid however we still haven't been able to communicate  with home health but [facility] is [facility] but the dressings have not been changed. The patient noted an odor last week. He is also had compression pumps ordered by [facility] this as a not completed the paperwork stage. 01/18/18; patient continues to lose weight. Stage III lymphedema in the left greater than right leg under for alert compression. The major wound is on the left lateral ankle area. He apparently has bilateral compression pumps being brought to his house, these were ordered by [facility] Notable for the fact today he had some blisters on the right anterior leg together with some skin nodules. This is no doubt secondary to severe lymphedema. 01/25/18; the patient has obtained his compression pumps and is using them per vein and vascular instructions 3 times a day for an hour. He also saw [name] of vein and vascular. He was felt to have venous insufficiency but did not suggest any intervention also improved edema. It was suggested that he have compression stockings 20-30 mm on a daily basis in addition to compression pumps. The patient arrives in clinic today with a layer of unna under for layer compression. he seems to have some trouble with the degree of compression. He has open areas on the left lateral ankle area which is his major wound left upper medial calf and a Lemus, Fortino A. ([id number]) superficial open area on the right anterior shin area which was blistered last week. He has skin changes on the right anterior calf which I think are no doubt secondary to lymphedema skin nodules etc. 02/01/18; the patient comes in telling us his nephrologist have to his torsemide. Unfortunately today he is put on  7 pounds by our scales. He has blisters all over the anterior and medial part of his right calf and a new open wound. He also has soupy green drainage coming out of the left lateral calf /ankle wound. 02/08/18; culture I did last week of the left lateral ankle wound grew both Pseudomonas and Morganella. He is on Keflex from Dr. Ola Spurr in the hospital. I will need to review these notes.in any case Keflex is not going to cover these 2 organisms. I'm probably going to added ciprofloxacin today for 1 week. A lot of drainage that looks purulent last week. He is not complaining of pain however he has managed to put on 10 pounds in 2 weeks by our scales in this clinic. He is going to see his nephrologist tomorrow 02/15/18; he completed the ciprofloxacin I gave him last week. Notable that he is up to 379 pounds today which is up 16 pounds from 2 weeks ago. He is complaining of orthopnea but doesn't have any chest pain. 02/22/18; he continues to have weight gain. R intake nurse reports again purulent green drainage coming out of the lateral wound on the lateral left calf. He has small open area on the right anterior leg. His torsemide was increased to 2 tablets a day I believe this is 40 mg last week in response to the call admitted to Dr. Keturah Barre office. He follows up with Dr. [name] and Dr. Delana Meyer tomorrow 03/01/18 his weight essentially stable today at 383 pounds. Drainage out of the left lateral wound on the lower left calf/ankle is a lot less. Culture last time grew Pseudomonas. I put him on cefdinir. He has been to Dr. Keturah Barre office no adjustments in his diuretics. Dr. Nicoletta Dress prescribed a wraparound stocking for the right leg.there is no open area on  the right leg. He has a superficial area on the left medial calf, left posterior calf and in the large area on the left lateral however this looks better 03/15/18; weight is not up to 393 pounds. He saw his nephrologist yesterday Dr. [name] will increase  the Demadex I'm hopeful this will help with the edema control. I'm using silver alginate to all his wounds. In particular the left lateral ankle looks better. Unfortunately he has new open areas on the right lateral calf that will include use of his compression stocking at least in the short to medium term. He has new wounds o3 on the right lateral calf. One of these has some size however all numerous superficial 03/22/18; his weight is stabilized a bit. Just adjustment of his diuretics by his nephrologist. There is no doubt he has some degree of systemic fluid overload on top of severe left greater than right lymphedema. 2 weeks ago tried to transition him to stockings on the right leg however he developed re-breakdown of skin on the right leg and we had to put him back in compression last week. He also uses external compression pumps and claims to be compliant Continued concern about his depression today 03/29/18; several ongoing issues with this patient; He no longer has home health coverage apparently secondary to a lapse in insurance. He is apparently transitioning from short for long-term disability. [facility] was changing his compression wraps on Monday and Friday. He has gained 10 pounds since last week Sees Dr. [name] tomorrow Saw his primary doctor last week about the depression. It sounds as though he declined pharmacologic management. He seems somewhat better today. We were concerned last week when he came. Somewhat better today He is using his compression pumps once a day at home, I have asked for twice a day if possible especially on the left leg Follows up with his nephrologist in mid-August. He is managing his diuretic for I think stage IV chronic renal failure Paradoxically his wounds actually look better 04/19/18; the patient has not been seen since I last saw him 3 weeks ago. He saw Dr. Delana Meyer of vascular surgery on 03/30/18 I believe he put him in a 20/30 stocking  bilaterally with a wraparound extremitease stocking. He has not been putting anything specifically on the wound. More problematic than that he has not been wearing the stockings he is at home. He has been using his external compression pumps once per day according to him on a rare occasion twice On a psychosocial level the patient is now on long-term disability and is applying for COBRA therefore he is between insurances. He has not been able to follow up with Dr. [name] who is his nephrologist as a result. As noted his weight is up to 407 pounds today. He promises me he'll follow-up with Dr. [name] 05/03/18; he hasn't been here in 2 weeks now. He apparently has been wearing a compression sock on the left leg. Massive increase in edema 3 large open wounds on the left anterior leg that were probably blisters. Significant deterioration in the left lateral ankle wound that we've been doing as his most problematic wound. He still does not have his insurance issues wrapped up. His weight is well over 400 pounds. 05/10/18; arrives today with better looking edema control in the left leg. He has been using his compression pumps twice a day. We also wrapped it is left leg for there he's been using his pumps so there is much better  edema control. Most of new wounds from last week look a lot better. Even the refractory area on the lower left lateral ankle looks a lot better to me today. He follows up with Dr. [name] this morning [nephrology] 05/17/18 patient arrives today with a lot less edema in the left leg. His weight is gone down 7 pounds. He tells me he saw Dr. Helene Kelp, Asiah A. ([id number]) [name] but his torsemide was not adjusted. He is using the palms 3 times a day. There is been quite an improvement in the remaining wound on the left lateral ankle Weekly visit for follow-up of bilateral lower extremity wounds related to severe lymphedema and probably some degree of systemic fluid overload from chronic renal  failure stage IV. His weight is up this week to over 400 pounds. He noted increasing edema in the right leg late last week. He took his compression stocking off he did not increase the frequency of this compression pump use. He developed a large blister on the back of the right calf. He also has a new opened blister on the left lateral calf in addition to the wound that we've been using on the distal left lower calf area and we've been using silver alginate. He tells me that he has an ultrasound which is a DVT rule out and I think a reflux study ordered by Dr. [name] 05/31/18; weekly visit. He went to see vein and vascular this week apparently they remove the 4-layer compression on the left and put an Unna boot on him in replacement. He's got more swelling in the left leg is resolved. That being said his left lower Wound is better. He still has a fairly large wound on the right posterior calf this was not disturbed. He has a follow-up appointment with Dr. Keturah Barre nephrologist next week 06/07/2018; the patient arrives today with a history that he took both his compression wraps off 3 days ago in order to take a shower. He has bilateral severe tense blisters. A lot of weeping erythema especially on the lateral left calf. Almost circumferential blisters on the right. Multiple areas of epithelial breakdown. He does not complain of any pain fever chills. He states he has been using his compression pump in some form of stocking that I could not really determine the type. He did not come into the clinic with anything on his legs. He tells me he has an appointment with Dr. Merita Norton his nephrologist I believe this Friday. Paradoxically his weight is actually down to 385 pounds I believe last week he was over 400 06/14/18; arrives with better looking wound surfaces today on both legs. There is no further blistering however there are still areas that aren't epithelialized on the right anterior, right lateral, left  posterior and left medial. His original wound just above the left ankle laterally is still open moist. His weight was about the same today. He tells me that his nephrologist increase the torsemide from 40/20/09/1939/40 twice a day Objective Constitutional Patient is hypertensive.. Pulse regular and within target range for patient.[name] Respirations regular, non-labored and within target range.. Temperature is normal and within the target range for the patient.[name] appears in no distress. Vitals Time Taken: 1:02 PM, Height: 76 in, Weight: 385.9 lbs, BMI: 47, Temperature: 98.3 F, Pulse: 55 bpm, Respiratory Rate: 18 breaths/min, Blood Pressure: 155/82 mmHg. Eyes Conjunctivae clear. No discharge. Respiratory Respiratory effort is easy and symmetric bilaterally. Rate is normal at rest and on room air.. Cardiovascular Extreme lymphedema in the  left calf greater than right calf although I think this is about stable for him. Certainly not as bad as last week.[name] Psychiatric Mood appears stable. General Notes: Wound exam The patient's wounds on the right calf look better. All the blisters are fully unroofed. He has areas on the right anterior mid calf/tibial area and right lateral calf there is still not fully epithelialized. He has areas of Murdoch, Bracken A. ([id number]) weeping edema left Posteriorly and medially. His original wound just above the left ankle looks about the same it is coming and really nicely over it as a macerated base very wet. It is in a fold of lymphedematous tissue Integumentary (Hair, Skin) Other than lymphedema no primary skin issues are seen. Wound #16 status is Healed - Epithelialized. Original cause of wound was Gradually Appeared. The wound is located on the Right,Posterior Lower Leg. The wound measures 0cm length x 0cm width x 0cm depth; 0cm^2 area and 0cm^3 volume. Wound #2 status is Open. Original cause of wound was Gradually Appeared. The wound is located on  the Left,Lateral,Posterior Lower Leg. The wound measures 2cm length x 7cm width x 0.7cm depth; 10.996cm^2 area and 7.697cm^3 volume. There is no tunneling or undermining noted. There is a large amount of serous drainage noted. There is no granulation within the wound bed. There is a large (67-100%) amount of necrotic tissue within the wound bed including Adherent Slough. The periwound skin appearance exhibited: Maceration. The periwound skin appearance did not exhibit: Callus, Crepitus, Excoriation, Induration, Rash, Scarring, Dry/Scaly, Atrophie Blanche, Cyanosis, Ecchymosis, Hemosiderin Staining, Mottled, Pallor, Rubor, Erythema. Assessment Active Problems ICD-10 Non-pressure chronic ulcer of left calf with necrosis of muscle Lymphedema, not elsewhere classified Chronic venous hypertension (idiopathic) with ulcer and inflammation of left lower extremity Type 2 diabetes mellitus with other skin ulcer Non-pressure chronic ulcer of right calf limited to breakdown of skin Procedures There was a Four Layer Compression Therapy Procedure by Cornell Barman, RN. Post procedure Diagnosis Wound #: Same as Pre-Procedure Plan Wound Cleansing: Wound #2 Left,Lateral,Posterior Lower Leg: Clean wound with Normal Saline. Clean wound with Normal Saline. Cleanse wound with mild soap and water Cleanse wound with mild soap and water Anesthetic (add to Medication List): Wound #2 Left,Lateral,Posterior Lower Leg: Topical Lidocaine 4% cream applied to wound bed prior to debridement (In Clinic Only). Primary Wound Dressing: Rokosz, Derrell A. ([id number]) Other: - Bilateral Legs - weeping areas Wound #2 Left,Lateral,Posterior Lower Leg: Silver Alginate Silver Alginate Secondary Dressing: Wound #2 Left,Lateral,Posterior Lower Leg: Other - absorbent dressing on all weeping areas Dressing Change Frequency: Wound #2 Left,Lateral,Posterior Lower Leg: Change dressing every week Other: - nurse visit if  needed Follow-up Appointments: Wound #2 Left,Lateral,Posterior Lower Leg: Return Appointment in 1 week. Nurse Visit as needed Edema Control: Wound #2 Left,Lateral,Posterior Lower Leg: 4 Layer Compression System - Bilateral Compression Pump: Use compression pump on left lower extremity for 30 minutes, twice daily. - 1 Hour twice Daily Compression Pump: Use compression pump on right lower extremity for 30 minutes, twice daily. - 1 Hour twice daily #1 in general both legs look better. The blisters last week came about when he took his wraps off. He continues to state he has been compliant with the compression pumps twice a day although locating his legs last week it was difficult to believe that. #2 he has had an increase in his torsemide as well he is being referred to vascular surgery for a shunt for planning of dialysis. #3 there is no evidence of cellulitis  or infection in either leg Electronic Signature(s) Signed: 06/14/2018 4:50:13 PM By: Linton Ham MD Entered By: Linton Ham on 06/14/2018 15:12:03 Toro, Kamau A. ([id number]) -------------------------------------------------------------------------------- [facility] Details Patient Name: Ellenbecker, Noell A. Date of Service: 06/14/2018 Medical Record Number: [id number] Patient Account Number: [id number] Date of Birth/Sex: Aug 16, 1971 (47 y.o. M) Treating RN: Cornell Barman Primary Care Provider: Gaetano Net Other Clinician: Referring Provider: Gaetano Net Treating Provider/Extender: Tito Dine in Treatment: 24 Diagnosis Coding ICD-10 Codes Code Description [phone] Non-pressure chronic ulcer of left calf with necrosis of muscle I89.0 Lymphedema, not elsewhere classified I87.332 Chronic venous hypertension (idiopathic) with ulcer and inflammation of left lower extremity E11.622 Type 2 diabetes mellitus with other skin ulcer L97.211 Non-pressure chronic ulcer of right calf limited to breakdown of skin Facility  Procedures CPT4: Description Modifier Quantity Code [id number] BILATERAL: Application of multi-layer venous compression system; leg (below 1 knee), including ankle and foot. Physician Procedures CPT4 Code: [id number] Description: 74944 - WC PHYS LEVEL 3 - EST PT ICD-10 Diagnosis Description L97.223 Non-pressure chronic ulcer of left calf with necrosis of mu L97.211 Non-pressure chronic ulcer of right calf limited to breakdo I89.0 Lymphedema, not elsewhere classified Modifier: scle wn of skin Quantity: 1 Electronic Signature(s) Signed: 06/14/2018 5:08:48 PM By: Gretta Cool, BSN, RN, CWS, Kim RN, BSN Previous Signature: 06/14/2018 4:50:13 PM Version By: Linton Ham MD Entered By: Gretta Cool, BSN, RN, CWS, Kim on 06/14/2018 17:08:47

## 2018-06-16 NOTE — Progress Notes (Signed)
EDRAS, WILFORD ([id number]) Visit Report for 06/14/2018 Arrival Information Details Patient Name: Harth, Gay A. Date of Service: 06/14/2018 1:30 PM Medical Record Number: [id number] Patient Account Number: [id number] Date of Birth/Sex: 03/13/1971 (47 y.o. M) Treating RN: Cornell Barman Primary Care Charlen Bakula: Gaetano Net Other Clinician: Referring Claudean Leavelle: Gaetano Net Treating Martese Vanatta/Extender: Tito Dine in Treatment: 24 Visit Information History Since Last Visit Added or deleted any medications: No Patient Arrived: Ambulatory Any new allergies or adverse reactions: No Arrival Time: 13:00 Had a fall or experienced change in No Accompanied By: self activities of daily living that may affect Transfer Assistance: None risk of falls: Patient Identification Verified: Yes Signs or symptoms of abuse/neglect since last visito No Secondary Verification Process Yes Hospitalized since last visit: No Completed: Implantable device outside of the clinic excluding No Patient Requires Transmission-Based No cellular tissue based products placed in the center Precautions: since last visit: Patient Has Alerts: Yes Has Dressing in Place as Prescribed: Yes Patient Alerts: DM II Pain Present Now: No noncompressible bilateral Electronic Signature(s) Signed: 06/14/2018 2:55:18 PM By: Lorine Bears RCP, RRT, CHT Entered By: Lorine Bears on 06/14/2018 13:04:22 Sharps, Lenix A. ([id number]) -------------------------------------------------------------------------------- Compression Therapy Details Patient Name: Catapano, Xabi A. Date of Service: 06/14/2018 1:30 PM Medical Record Number: [id number] Patient Account Number: [id number] Date of Birth/Sex: 1970-10-06 (47 y.o. M) Treating RN: Cornell Barman Primary Care Jermiah Howton: Gaetano Net Other Clinician: Referring Reece Fehnel: Gaetano Net Treating Briyah Wheelwright/Extender: Tito Dine in Treatment:  24 Compression Therapy Performed for Wound Assessment: NonWound Condition Lymphedema - Bilateral Leg Performed By: Clinician Cornell Barman, RN Compression Type: Four Layer Post Procedure Diagnosis Same as Pre-procedure Electronic Signature(s) Signed: 06/14/2018 5:17:02 PM By: Gretta Cool, BSN, RN, CWS, Kim RN, BSN Entered By: Gretta Cool, BSN, RN, CWS, Kim on 06/14/2018 13:51:44 Kertz, Dontell AMarland Kitchen ([id number]) -------------------------------------------------------------------------------- Encounter Discharge Information Details Patient Name: Crisman, Shubham A. Date of Service: 06/14/2018 1:30 PM Medical Record Number: [id number] Patient Account Number: [id number] Date of Birth/Sex: 24-Aug-1971 (47 y.o. M) Treating RN: Cornell Barman Primary Care Corinthian Mizrahi: Gaetano Net Other Clinician: Referring Demaree Liberto: Gaetano Net Treating Essance Gatti/Extender: Tito Dine in Treatment: 24 Encounter Discharge Information Items Discharge Condition: Stable Ambulatory Status: Ambulatory Discharge Destination: Home Transportation: Private Auto Accompanied By: self Schedule Follow-up Appointment: Yes Clinical Summary of Care: Electronic Signature(s) Signed: 06/14/2018 5:09:50 PM By: Gretta Cool, BSN, RN, CWS, Kim RN, BSN Entered By: Gretta Cool, BSN, RN, CWS, Kim on 06/14/2018 17:09:50 Snuffer, Eberardo AMarland Kitchen ([id number]) -------------------------------------------------------------------------------- Lower Extremity Assessment Details Patient Name: Laster, Rambo A. Date of Service: 06/14/2018 1:30 PM Medical Record Number: [id number] Patient Account Number: [id number] Date of Birth/Sex: 06-13-1971 (47 y.o. M) Treating RN: Secundino Ginger Primary Care France Lusty: Gaetano Net Other Clinician: Referring Audrionna Lampton: Gaetano Net Treating Janavia Rottman/Extender: Tito Dine in Treatment: 24 Edema Assessment Assessed: [Left: No] [Right: No] Edema: [Left: Yes] [Right: Yes] Calf Left: Right: Point of Measurement: 36 cm From Medial  Instep 64 cm 54 cm Ankle Left: Right: Point of Measurement: 14 cm From Medial Instep 50 cm 37 cm Vascular Assessment Claudication: Claudication Assessment [Left:None] [Right:None] Pulses: Dorsalis Pedis Palpable: [Left:Yes] [Right:Yes] Posterior Tibial Extremity colors, hair growth, and conditions: Extremity Color: [Left:Hyperpigmented] [Right:Hyperpigmented] Hair Growth on Extremity: [Left:No] [Right:No] Temperature of Extremity: [Left:Warm] [Right:Warm] Capillary Refill: [Right:< 3 seconds] Toe Nail Assessment Left: Right: Thick: Yes Yes Discolored: Yes Yes Deformed: Yes Yes Improper Length and Hygiene: No No Electronic Signature(s) Signed: 06/14/2018 4:03:08 PM By: Secundino Ginger Entered By: Secundino Ginger on 06/14/2018 13:42:34 Macnaughton,  Ronav A. ([id number]) -------------------------------------------------------------------------------- Multi Wound Chart Details Patient Name: Aime, Drevon A. Date of Service: 06/14/2018 1:30 PM Medical Record Number: [id number] Patient Account Number: [id number] Date of Birth/Sex: September 07, 1970 (47 y.o. M) Treating RN: Cornell Barman Primary Care Imonie Tuch: Gaetano Net Other Clinician: Referring Darilyn Storbeck: Gaetano Net Treating Baraka Klatt/Extender: Tito Dine in Treatment: 24 Vital Signs Height(in): 76 Pulse(bpm): 55 Weight(lbs): 385.9 Blood Pressure(mmHg): 155/82 Body Mass Index(BMI): 47 Temperature(F): 98.3 Respiratory Rate 18 (breaths/min): Photos: [16:No Photos] [N/A:N/A] Wound Location: Right, Posterior Lower Leg Left Lower Leg - Lateral, N/A Posterior Wounding Event: Gradually Appeared Gradually Appeared N/A Primary Etiology: Lymphedema Diabetic Wound/Ulcer of the N/A Lower Extremity Secondary Etiology: N/A Lymphedema N/A Comorbid History: N/A Hypertension, Type II N/A Diabetes, Neuropathy Date Acquired: 05/20/2018 09/30/2017 N/A Weeks of Treatment: 3 24 N/A Wound Status: Healed - Epithelialized Open N/A 0x0x0 2x7x0.7  N/A Eltringham, Hendrik A. ([id number]) Measurements L x W x D (cm) Area (cm) : 0 10.996 N/A Volume (cm) : 0 7.697 N/A % Reduction in Area: 100.00% 61.10% N/A % Reduction in Volume: 100.00% 31.90% N/A Classification: Full Thickness Without Grade 2 N/A Exposed Support Structures Exudate Amount: N/A Large N/A Exudate Type: N/A Serous N/A Exudate Color: N/A amber N/A Granulation Amount: N/A None Present (0%) N/A Necrotic Amount: N/A Large (67-100%) N/A Periwound Skin Texture: No Abnormalities Noted Excoriation: No N/A Induration: No Callus: No Crepitus: No Rash: No Scarring: No Periwound Skin Moisture: No Abnormalities Noted Maceration: Yes N/A Dry/Scaly: No Periwound Skin Color: No Abnormalities Noted Atrophie Blanche: No N/A Cyanosis: No Ecchymosis: No Erythema: No Hemosiderin Staining: No Mottled: No Pallor: No Rubor: No Tenderness on Palpation: No No N/A Wound Preparation: N/A Ulcer Cleansing: N/A Rinsed/Irrigated with Saline Topical Anesthetic Applied: Other: lidocaine 4% Treatment Notes Electronic Signature(s) Signed: 06/14/2018 4:50:13 PM By: Linton Ham MD Entered By: Linton Ham on 06/14/2018 15:06:26 Bardwell, [place] ([id number]) -------------------------------------------------------------------------------- Multi-Disciplinary Care Plan Details Patient Name: Bines, Elster A. Date of Service: 06/14/2018 1:30 PM Medical Record Number: [id number] Patient Account Number: [id number] Date of Birth/Sex: 12/18/1970 (47 y.o. M) Treating RN: Cornell Barman Primary Care Jalyah Weinheimer: Gaetano Net Other Clinician: Referring Marlean Mortell: Gaetano Net Treating Aaren Krog/Extender: Tito Dine in Treatment: 24 Active Inactive ` Abuse / Safety / Falls / Self Care Management Nursing Diagnoses: Potential for falls Goals: Patient will remain injury free related to falls Date Initiated: 12/28/2017 Target Resolution Date: 04/08/2018 Goal Status:  Active Interventions: Assess fall risk on admission and as needed Notes: ` Nutrition Nursing Diagnoses: Impaired glucose control: actual or potential Goals: Patient/caregiver verbalizes understanding of need to maintain therapeutic glucose control per primary care physician Date Initiated: 12/28/2017 Target Resolution Date: 04/08/2018 Goal Status: Active Interventions: Provide education on elevated blood sugars and impact on wound healing Notes: ` Orientation to the Wound Care Program Nursing Diagnoses: Knowledge deficit related to the wound healing center program Goals: Patient/caregiver will verbalize understanding of the [facility] Program Date Initiated: 12/28/2017 Target Resolution Date: 04/08/2018 Goal Status: Active Interventions: York, Claudie A. ([id number]) Provide education on orientation to the wound center Notes: ` Venous Leg Ulcer Nursing Diagnoses: Knowledge deficit related to disease process and management Goals: Patient will maintain optimal edema control Date Initiated: 03/27/2018 Target Resolution Date: 04/27/2018 Goal Status: Active Interventions: Assess peripheral edema status every visit. Compression as ordered Treatment Activities: Therapeutic compression applied : 03/22/2018 Notes: ` Wound/Skin Impairment Nursing Diagnoses: Impaired tissue integrity Goals: Ulcer/skin breakdown will heal within 14 weeks Date Initiated: 12/28/2017 Target Resolution Date: 04/08/2018 Goal Status:  Active Interventions: Assess patient/caregiver ability to obtain necessary supplies Assess patient/caregiver ability to perform ulcer/skin care regimen upon admission and as needed Assess ulceration(s) every visit Notes: Electronic Signature(s) Signed: 06/14/2018 5:17:02 PM By: Gretta Cool, BSN, RN, CWS, Kim RN, BSN Entered By: Gretta Cool, BSN, RN, CWS, Kim on 06/14/2018 13:47:26 Pursley, Dash A.  ([id number]) -------------------------------------------------------------------------------- Non-Wound Condition Assessment Details Patient Name: Billiter, Garlan A. Date of Service: 06/14/2018 1:30 PM Medical Record Number: [id number] Patient Account Number: [id number] Date of Birth/Sex: September 17, 1970 (47 y.o. M) Treating RN: Secundino Ginger Primary Care Windel Keziah: Gaetano Net Other Clinician: Referring Lawrnce Reyez: Gaetano Net Treating Pallas Wahlert/Extender: Tito Dine in Treatment: 24 Non-Wound Condition: Condition: Lymphedema Location: Leg Side: Bilateral Periwound Skin Texture Texture Color No Abnormalities Noted: No No Abnormalities Noted: No Callus: No Atrophie Blanche: No Crepitus: No Cyanosis: No Excoriation: Yes Ecchymosis: No Friable: No Erythema: Yes Induration: Yes Erythema Location: Circumferential Rash: No Hemosiderin Staining: Yes Scarring: No Mottled: No Pallor: No Moisture Rubor: Yes No Abnormalities Noted: No Dry / Scaly: No Maceration: Yes Electronic Signature(s) Signed: 06/14/2018 4:03:08 PM By: Secundino Ginger Entered By: Secundino Ginger on 06/14/2018 13:33:11 Locklin, Aldine A. ([id number]) -------------------------------------------------------------------------------- Pain Assessment Details Patient Name: Pankow, Zohaib A. Date of Service: 06/14/2018 1:30 PM Medical Record Number: [id number] Patient Account Number: [id number] Date of Birth/Sex: September 05, 1970 (47 y.o. M) Treating RN: Cornell Barman Primary Care Kesean Serviss: Gaetano Net Other Clinician: Referring Shavontae Gibeault: Gaetano Net Treating Dominion Kathan/Extender: Tito Dine in Treatment: 24 Active Problems Location of Pain Severity and Description of Pain Patient Has Paino No Site Locations Pain Management and Medication Current Pain Management: Electronic Signature(s) Signed: 06/14/2018 2:55:18 PM By: Lorine Bears RCP, RRT, CHT Signed: 06/14/2018 5:17:02 PM By: Gretta Cool, BSN, RN, CWS,  Kim RN, BSN Entered By: Lorine Bears on 06/14/2018 13:04:29 Storrs, [name] ([id number]) -------------------------------------------------------------------------------- Patient/Caregiver Education Details Patient Name: Kaufmann, Zain A. Date of Service: 06/14/2018 1:30 PM Medical Record Number: [id number] Patient Account Number: [id number] Date of Birth/Gender: May 13, 1971 (47 y.o. M) Treating RN: Cornell Barman Primary Care Physician: Gaetano Net Other Clinician: Referring Physician: Gaetano Net Treating Physician/Extender: Tito Dine in Treatment: 48 Education Assessment Education Provided To: Patient Education Topics Provided Wound/Skin Impairment: Handouts: Caring for Your Ulcer Methods: Demonstration Responses: State content correctly Electronic Signature(s) Signed: 06/14/2018 5:17:02 PM By: Gretta Cool, BSN, RN, CWS, Kim RN, BSN Entered By: Gretta Cool, BSN, RN, CWS, Kim on 06/14/2018 17:10:04 Ullom, SamnorwoodMarland Kitchen ([id number]) -------------------------------------------------------------------------------- Wound Assessment Details Patient Name: Gunderman, Grey A. Date of Service: 06/14/2018 1:30 PM Medical Record Number: [id number] Patient Account Number: [id number] Date of Birth/Sex: [birth year] (47 y.o. M) Treating RN: Secundino Ginger Primary Care Nisreen Guise: Gaetano Net Other Clinician: Referring Chakia Counts: Gaetano Net Treating Therasa Lorenzi/Extender: Tito Dine in Treatment: 24 Wound Status Wound Number: 16 Primary Etiology: Lymphedema Wound Location: Right, Posterior Lower Leg Wound Status: Healed - Epithelialized Wounding Event: Gradually Appeared Date Acquired: 05/20/2018 Weeks Of Treatment: 3 Clustered Wound: No Wound Measurements Length: (cm) 0 Width: (cm) 0 Depth: (cm) 0 Area: (cm) 0 Volume: (cm) 0 % Reduction in Area: 100% % Reduction in Volume: 100% Wound Description Full Thickness Without Exposed  Support Classification: Structures Periwound Skin Texture Texture Color No Abnormalities Noted: No No Abnormalities Noted: No Moisture No Abnormalities Noted: No Electronic Signature(s) Signed: 06/14/2018 4:03:08 PM By: Secundino Ginger Entered By: Secundino Ginger on 06/14/2018 13:34:02 Mchaney, [place]. ([id number]) -------------------------------------------------------------------------------- Wound Assessment Details Patient Name: Mcclenahan, Can A. Date of Service: 06/14/2018 1:30 PM Medical Record Number: [id number] Patient Account  Number: [id number] Date of Birth/Sex: [birth year] (47 y.o. M) Treating RN: Secundino Ginger Primary Care Karime Scheuermann: Gaetano Net Other Clinician: Referring Deundra Furber: Gaetano Net Treating Hudsen Fei/Extender: Tito Dine in Treatment: 24 Wound Status Wound Number: 2 Primary Etiology: Diabetic Wound/Ulcer of the Lower Extremity Wound Location: Left Lower Leg - Lateral, Posterior Secondary Lymphedema Wounding Event: Gradually Appeared Etiology: Date Acquired: 09/30/2017 Wound Status: Open Weeks Of Treatment: 24 Comorbid History: Hypertension, Type II Diabetes, Clustered Wound: No Neuropathy Photos Wound Measurements Length: (cm) 2 Width: (cm) 7 Depth: (cm) 0.7 Area: (cm) 10.996 Volume: (cm) 7.697 % Reduction in Area: 61.1% % Reduction in Volume: 31.9% Tunneling: No Undermining: No Wound Description Classification: Grade 2 Exudate Amount: Large Exudate Type: Serous Exudate Color: amber Foul Odor After Cleansing: No Slough/Fibrino Yes Wound Bed Granulation Amount: None Present (0%) Exposed Structure Necrotic Amount: Large (67-100%) Fascia Exposed: No Necrotic Quality: Adherent Slough Fat Layer (Subcutaneous Tissue) Exposed: No Tendon Exposed: No Muscle Exposed: No Joint Exposed: No Bone Exposed: No Periwound Skin Texture Texture Color No Abnormalities Noted: No No Abnormalities Noted: No Mccullar, Isaic A. ([id number]) Callus:  No Atrophie Blanche: No Crepitus: No Cyanosis: No Excoriation: No Ecchymosis: No Induration: No Erythema: No Rash: No Hemosiderin Staining: No Scarring: No Mottled: No Pallor: No Moisture Rubor: No No Abnormalities Noted: No Dry / Scaly: No Maceration: Yes Wound Preparation Ulcer Cleansing: Rinsed/Irrigated with Saline Topical Anesthetic Applied: Other: lidocaine 4%, Treatment Notes Wound #2 (Left, Lateral, Posterior Lower Leg) Notes S cell, 4 layer bilateral Electronic Signature(s) Signed: 06/14/2018 4:03:08 PM By: Secundino Ginger Entered By: Secundino Ginger on 06/14/2018 14:07:19 Polson, Adorian A. ([id number]) -------------------------------------------------------------------------------- Vitals Details Patient Name: Lavalle, Erland A. Date of Service: 06/14/2018 1:30 PM Medical Record Number: [id number] Patient Account Number: [id number] Date of Birth/Sex: 1971-06-27 (47 y.o. M) Treating RN: Cornell Barman Primary Care Ja Pistole: Gaetano Net Other Clinician: Referring Kaysen Deal: Gaetano Net Treating Cataleah Stites/Extender: Tito Dine in Treatment: 24 Vital Signs Time Taken: 13:02 Temperature (F): 98.3 Height (in): 76 Pulse (bpm): 55 Weight (lbs): 385.9 Respiratory Rate (breaths/min): 18 Body Mass Index (BMI): 47 Blood Pressure (mmHg): 155/82 Reference Range: 80 - 120 mg / dl Electronic Signature(s) Signed: 06/14/2018 2:55:18 PM By: Lorine Bears RCP, RRT, CHT Entered By: Lorine Bears on 06/14/2018 13:06:36

## 2018-06-21 ENCOUNTER — Encounter: Payer: BLUE CROSS/BLUE SHIELD | Admitting: Internal Medicine

## 2018-06-21 DIAGNOSIS — E11622 Type 2 diabetes mellitus with other skin ulcer: Secondary | ICD-10-CM | POA: Diagnosis not present

## 2018-06-23 ENCOUNTER — Telehealth (INDEPENDENT_AMBULATORY_CARE_PROVIDER_SITE_OTHER): Payer: Self-pay

## 2018-06-23 NOTE — Progress Notes (Signed)
ANUSH, WIEDEMAN ([id number]) Visit Report for 06/21/2018 Arrival Information Details Patient Name: [name], [name] Date of Service: 06/21/2018 9:15 AM Medical Record Number: [id number] Patient Account Number: [id number] Date of Birth/Sex: [birth year] (47 y.o. M) Treating RN: Cornell Barman Primary Care Jeffry Vogelsang: Gaetano Net Other Clinician: Referring Delia Slatten: Gaetano Net Treating Abbie Jablon/Extender: Tito Dine in Treatment: 25 Visit Information History Since Last Visit Added or deleted any medications: No Patient Arrived: Ambulatory Any new allergies or adverse reactions: No Arrival Time: 09:01 Had a fall or experienced change in No Accompanied By: self activities of daily living that may affect Transfer Assistance: None risk of falls: Patient Identification Verified: Yes Signs or symptoms of abuse/neglect since last visito No Secondary Verification Process Yes Hospitalized since last visit: No Completed: Implantable device outside of the clinic excluding No Patient Requires Transmission-Based No cellular tissue based products placed in the center Precautions: since last visit: Patient Has Alerts: Yes Has Dressing in Place as Prescribed: Yes Patient Alerts: DM II Pain Present Now: No noncompressible bilateral Electronic Signature(s) Signed: 06/21/2018 3:20:57 PM By: Lorine Bears RCP, RRT, CHT Entered By: Lorine Bears on 06/21/2018 09:02:15 [name], [name] ([id number]) -------------------------------------------------------------------------------- Compression Therapy Details Patient Name: Sconyers, [name] Date of Service: 06/21/2018 9:15 AM Medical Record Number: [id number] Patient Account Number: [id number] Date of Birth/Sex: [birth year] (47 y.o. M) Treating RN: Secundino Ginger Primary Care Kyera Felan: Gaetano Net Other Clinician: Referring Megin Consalvo: Gaetano Net Treating Jaelon Gatley/Extender: Tito Dine in Treatment:  25 Compression Therapy Performed for Wound Assessment: Wound #2 Left,Lateral,Posterior Lower Leg Performed By: Clinician Secundino Ginger, RN Compression Type: Four Layer Post Procedure Diagnosis Same as Pre-procedure Electronic Signature(s) Signed: 06/21/2018 4:05:05 PM By: Secundino Ginger Entered By: Secundino Ginger on 06/21/2018 09:31:14 [name], [name] ([id number]) -------------------------------------------------------------------------------- Encounter Discharge Information Details Patient Name: [name], [name] Date of Service: 06/21/2018 9:15 AM Medical Record Number: [id number] Patient Account Number: [id number] Date of Birth/Sex: 06-Jan-1971 (47 y.o. M) Treating RN: Cornell Barman Primary Care Suda Forbess: Gaetano Net Other Clinician: Referring Derek Laughter: Gaetano Net Treating Dailah Opperman/Extender: Tito Dine in Treatment: 25 Encounter Discharge Information Items Discharge Condition: Stable Ambulatory Status: Ambulatory Discharge Destination: Home Transportation: Private Auto Schedule Follow-up Appointment: Yes Clinical Summary of Care: Electronic Signature(s) Signed: 06/21/2018 4:25:28 PM By: [name], BSN, RN, CWS, Kim RN, BSN Entered By: [name], BSN, RN, CWS, Kim on 06/21/2018 16:25:28 [name], [name] ([id number]) -------------------------------------------------------------------------------- Lower Extremity Assessment Details Patient Name: [name], [name] Date of Service: 06/21/2018 9:15 AM Medical Record Number: [id number] Patient Account Number: [id number] Date of Birth/Sex: Jul 10, 1971 (48 y.o. M) Treating RN: Secundino Ginger Primary Care Sylvi Rybolt: Gaetano Net Other Clinician: Referring Brit Carbonell: Gaetano Net Treating Kanishk Stroebel/Extender: Tito Dine in Treatment: 25 Edema Assessment Assessed: [Left: No] [Right: No] Edema: [Left: Yes] [Right: Yes] Calf Left: Right: Point of Measurement: 36 cm From Medial Instep 65 cm 53 cm Ankle Left: Right: Point of Measurement: 14  cm From Medial Instep 50 cm 36 cm Vascular Assessment Pulses: Posterior Tibial Extremity colors, hair growth, and conditions: Extremity Color: [Left:Hyperpigmented] [Right:Hyperpigmented] Hair Growth on Extremity: [Left:No] [Right:No] Temperature of Extremity: [Left:Warm] [Right:Warm] Capillary Refill: [Left:< 3 seconds] [Right:< 3 seconds] Toe Nail Assessment Left: Right: Thick: Yes Yes Discolored: Yes Yes Deformed: Yes Yes Improper Length and Hygiene: Yes Yes Electronic Signature(s) Signed: 06/21/2018 4:05:05 PM By: Secundino Ginger Entered By: Secundino Ginger on 06/21/2018 09:26:36 [name], [name] ([id number]) -------------------------------------------------------------------------------- Multi Wound Chart Details Patient Name: [name], [name] Date of Service: 06/21/2018 9:15 AM  Medical Record Number: [id number] Patient Account Number: [id number] Date of Birth/Sex: Jan 19, 1971 (47 y.o. M) Treating RN: Secundino Ginger Primary Care Kaitlin Ardito: Gaetano Net Other Clinician: Referring Ramona Ruark: Gaetano Net Treating Julien Oscar/Extender: Tito Dine in Treatment: 25 Vital Signs Height(in): 76 Pulse(bpm): 54 Weight(lbs): 385.9 Blood Pressure(mmHg): 155/66 Body Mass Index(BMI): 47 Temperature(F): 98.2 Respiratory Rate 18 (breaths/min): Photos: [2:No Photos] [N/A:N/A] Wound Location: [2:Left Lower Leg - Lateral, Posterior] [N/A:N/A] Wounding Event: [2:Gradually Appeared] [N/A:N/A] Primary Etiology: [2:Diabetic Wound/Ulcer of the Lower Extremity] [N/A:N/A] Secondary Etiology: [2:Lymphedema] [N/A:N/A] Comorbid History: [2:Hypertension, Type II Diabetes, Neuropathy] [N/A:N/A] Date Acquired: [2:09/30/2017] [N/A:N/A] Weeks of Treatment: [2:25] [N/A:N/A] Wound Status: [2:Open] [N/A:N/A] Measurements L x W x D [2:1.4x5x1] [N/A:N/A] (cm) Area (cm) : [2:5.498] [N/A:N/A] Volume (cm) : [2:5.498] [N/A:N/A] % Reduction in Area: [2:80.60%] [N/A:N/A] % Reduction in Volume: [2:51.40%]  [N/A:N/A] Classification: [2:Grade 2] [N/A:N/A] Exudate Amount: [2:Medium] [N/A:N/A] Exudate Type: [2:Serous] [N/A:N/A] Exudate Color: [2:amber] [N/A:N/A] Granulation Amount: [2:None Present (0%)] [N/A:N/A] Necrotic Amount: [2:Large (67-100%)] [N/A:N/A] Exposed Structures: [2:Fascia: No Fat Layer (Subcutaneous Tissue) Exposed: No Tendon: No Muscle: No Joint: No Bone: No] [N/A:N/A] Periwound Skin Texture: [2:Excoriation: No Induration: No Callus: No Crepitus: No Rash: No Scarring: No] [N/A:N/A] Periwound Skin Moisture: Maceration: Yes N/A N/A Dry/Scaly: No Periwound Skin Color: Atrophie Blanche: No N/A N/A Cyanosis: No Ecchymosis: No Erythema: No Hemosiderin Staining: No Mottled: No Pallor: No Rubor: No Tenderness on Palpation: No N/A N/A Wound Preparation: Ulcer Cleansing: N/A N/A Rinsed/Irrigated with Saline, Other: soap and water Topical Anesthetic Applied: Other: lidocaine 4% Procedures Performed: Compression Therapy N/A N/A Treatment Notes Electronic Signature(s) Signed: 06/21/2018 4:09:45 PM By: Linton Ham MD Entered By: Linton Ham on 06/21/2018 10:01:29 Chlebowski, Grover AMarland Kitchen ([id number]) -------------------------------------------------------------------------------- Multi-Disciplinary Care Plan Details Patient Name: [name], [name] Date of Service: 06/21/2018 9:15 AM Medical Record Number: [id number] Patient Account Number: [id number] Date of Birth/Sex: July 22, 1971 (47 y.o. M) Treating RN: Secundino Ginger Primary Care Neftaly Inzunza: Gaetano Net Other Clinician: Referring Lisa Blakeman: Gaetano Net Treating Terriann Difonzo/Extender: Tito Dine in Treatment: 25 Active Inactive ` Abuse / Safety / Falls / Self Care Management Nursing Diagnoses: Potential for falls Goals: Patient will remain injury free related to falls Date Initiated: 12/28/2017 Target Resolution Date: 04/08/2018 Goal Status: Active Interventions: Assess fall risk on admission and as  needed Notes: ` Nutrition Nursing Diagnoses: Impaired glucose control: actual or potential Goals: Patient/caregiver verbalizes understanding of need to maintain therapeutic glucose control per primary care physician Date Initiated: 12/28/2017 Target Resolution Date: 04/08/2018 Goal Status: Active Interventions: Provide education on elevated blood sugars and impact on wound healing Notes: ` Orientation to the Wound Care Program Nursing Diagnoses: Knowledge deficit related to the wound healing center program Goals: Patient/caregiver will verbalize understanding of the [facility] Date Initiated: 12/28/2017 Target Resolution Date: 04/08/2018 Goal Status: Active Interventions: Tomassetti, Aedon A. ([id number]) Provide education on orientation to the wound center Notes: ` Venous Leg Ulcer Nursing Diagnoses: Knowledge deficit related to disease process and management Goals: Patient will maintain optimal edema control Date Initiated: 03/27/2018 Target Resolution Date: 04/27/2018 Goal Status: Active Interventions: Assess peripheral edema status every visit. Compression as ordered Treatment Activities: Therapeutic compression applied : 03/22/2018 Notes: ` Wound/Skin Impairment Nursing Diagnoses: Impaired tissue integrity Goals: Ulcer/skin breakdown will heal within 14 weeks Date Initiated: 12/28/2017 Target Resolution Date: 04/08/2018 Goal Status: Active Interventions: Assess patient/caregiver ability to obtain necessary supplies Assess patient/caregiver ability to perform ulcer/skin care regimen upon admission and as needed Assess ulceration(s) every visit Notes: Electronic Signature(s) Signed: 06/21/2018  4:05:05 PM By: Secundino Ginger Entered By: Secundino Ginger on 06/21/2018 09:30:12 [name], [name] AMarland Kitchen ([id number]) -------------------------------------------------------------------------------- Pain Assessment Details Patient Name: [name], [name] Date of Service: 06/21/2018  9:15 AM Medical Record Number: [id number] Patient Account Number: [id number] Date of Birth/Sex: [birth year] (47 y.o. M) Treating RN: Cornell Barman Primary Care Magon Croson: Gaetano Net Other Clinician: Referring Kruti Horacek: Gaetano Net Treating Brenlee Koskela/Extender: Tito Dine in Treatment: 25 Active Problems Location of Pain Severity and Description of Pain Patient Has Paino No Site Locations Pain Management and Medication Current Pain Management: Electronic Signature(s) Signed: 06/21/2018 3:20:57 PM By: Lorine Bears RCP, RRT, CHT Signed: 06/21/2018 4:45:51 PM By: [name], BSN, RN, CWS, Kim RN, BSN Entered By: Lorine Bears on 06/21/2018 09:02:21 Spagnolo, [name] ([id number]) -------------------------------------------------------------------------------- Patient/Caregiver Education Details Patient Name: [name], [name] Date of Service: 06/21/2018 9:15 AM Medical Record Number: [id number] Patient Account Number: [id number] Date of Birth/Gender: 03-05-1971 (48 y.o. M) Treating RN: Secundino Ginger Primary Care Physician: Gaetano Net Other Clinician: Referring Physician: Gaetano Net Treating Physician/Extender: Tito Dine in Treatment: 25 Education Assessment Education Provided To: Patient Education Topics Provided Elevated Blood Sugar/ Impact on Healing: Handouts: Elevated Blood Sugars: How Do They Affect Wound Healing Methods: Demonstration, Explain/Verbal Responses: State content correctly Venous: Handouts: Controlling Swelling with Multilayered Compression Wraps Methods: Demonstration, Explain/Verbal Responses: State content correctly Wound/Skin Impairment: Electronic Signature(s) Signed: 06/21/2018 4:05:05 PM By: Secundino Ginger Entered By: Secundino Ginger on 06/21/2018 09:33:05 [name], [name] ([id number]) -------------------------------------------------------------------------------- Wound Assessment Details Patient Name: [name], [name]  A. Date of Service: 06/21/2018 9:15 AM Medical Record Number: [id number] Patient Account Number: [id number] Date of Birth/Sex: 1971/06/20 (47 y.o. M) Treating RN: Secundino Ginger Primary Care Vicie Cech: Gaetano Net Other Clinician: Referring Keagen Heinlen: Gaetano Net Treating Haroun Cotham/Extender: Tito Dine in Treatment: 25 Wound Status Wound Number: 2 Primary Etiology: Diabetic Wound/Ulcer of the Lower Extremity Wound Location: Left Lower Leg - Lateral, Posterior Secondary Lymphedema Wounding Event: Gradually Appeared Etiology: Date Acquired: 09/30/2017 Wound Status: Open Weeks Of Treatment: 25 Comorbid History: Hypertension, Type II Diabetes, Clustered Wound: No Neuropathy Photos Photo Uploaded By: Secundino Ginger on 06/21/2018 10:13:11 Wound Measurements Length: (cm) 1.4 Width: (cm) 5 Depth: (cm) 1 Area: (cm) 5.498 Volume: (cm) 5.498 % Reduction in Area: 80.6% % Reduction in Volume: 51.4% Tunneling: No Undermining: No Wound Description Classification: Grade 2 Exudate Amount: Medium Exudate Type: Serous Exudate Color: amber Foul Odor After Cleansing: No Slough/Fibrino Yes Wound Bed Granulation Amount: None Present (0%) Exposed Structure Necrotic Amount: Large (67-100%) Fascia Exposed: No Necrotic Quality: Adherent Slough Fat Layer (Subcutaneous Tissue) Exposed: No Tendon Exposed: No Muscle Exposed: No Joint Exposed: No Bone Exposed: No Periwound Skin Texture Texture Color Schellhorn, Luisfernando A. ([id number]) No Abnormalities Noted: No No Abnormalities Noted: No Callus: No Atrophie Blanche: No Crepitus: No Cyanosis: No Excoriation: No Ecchymosis: No Induration: No Erythema: No Rash: No Hemosiderin Staining: No Scarring: No Mottled: No Pallor: No Moisture Rubor: No No Abnormalities Noted: No Dry / Scaly: No Maceration: Yes Wound Preparation Ulcer Cleansing: Rinsed/Irrigated with Saline, Other: soap and water, Topical Anesthetic Applied: Other:  lidocaine 4%, Treatment Notes Wound #2 (Left, Lateral, Posterior Lower Leg) 1. Cleansed with: Cleanse wound with antibacterial soap and water 2. Anesthetic Topical Lidocaine 4% cream to wound bed prior to debridement Notes Silvercell, xsorb, 4layer bilateral Electronic Signature(s) Signed: 06/21/2018 4:05:05 PM By: Secundino Ginger Entered By: Secundino Ginger on 06/21/2018 09:24:18 Shiflet, Ashely A. ([id number]) -------------------------------------------------------------------------------- Vitals Details Patient Name: Bhandari, Abas A. Date of Service: 06/21/2018  9:15 AM Medical Record Number: [id number] Patient Account Number: [id number] Date of Birth/Sex: 24-Mar-1971 (47 y.o. M) Treating RN: Cornell Barman Primary Care Sarahanne Novakowski: Gaetano Net Other Clinician: Referring Zidane Renner: Gaetano Net Treating Emira Eubanks/Extender: Tito Dine in Treatment: 25 Vital Signs Time Taken: 09:01 Temperature (F): 98.2 Height (in): 76 Pulse (bpm): 54 Weight (lbs): 385.9 Respiratory Rate (breaths/min): 18 Body Mass Index (BMI): 47 Blood Pressure (mmHg): 155/66 Reference Range: 80 - 120 mg / dl Electronic Signature(s) Signed: 06/21/2018 3:20:57 PM By: Lorine Bears RCP, RRT, CHT Entered By: Becky Sax, Amado Nash on 06/21/2018 09:06:05

## 2018-06-23 NOTE — Progress Notes (Signed)
[name], [name] ([id number]) Visit Report for 06/21/2018 HPI Details Patient Name: [name], [name] Date of Service: 06/21/2018 9:15 AM Medical Record Number: [id number] Patient Account Number: [id number] Date of Birth/Sex: 26-Oct-1970 (47 y.o. M) Treating RN: Cornell Barman Primary Care Provider: Gaetano Net Other Clinician: Referring Provider: Gaetano Net Treating Provider/Extender: Tito Dine in Treatment: 25 History of Present Illness HPI Description: 12/28/17; this is a now 47 year old man who is a type II diabetic. He was hospitalized from 10/01/17 through 10/19/17. He had an MSSA soft tissue and skin infection. 2 open areas on the left leg were identified he has a smaller area on the left medial calf superiorly just below the knee and a wound just above the left ankle on the posterior medial aspect. I think both of these were surgical IandD sites when he was in the hospital. He was discharged with a wound VAC at that point however this is since been taken off. He follows with Dr. Ola Spurr for the [facility] and he is still on chronic Keflex at 500 twice a day. At that time he was hospitalized his hemoglobin A1c was 15.1 however if I'm reading his endocrinologist notes correctly that is improved. He has been following with Dr. [name] at [facility] and he has been applying calcium alginate and Unna boots. He has home health changing the dressing. They have also been attempting to get him external compression pumps although the patient is unaware whether they've been approved by insurance at this point. as mentioned he has a smaller clean wound on the right lateral calf just below the knee and he has a much larger area just above the left ankle medially and posteriorly. Our intake nurse reported greenish purulent looking drainage.the patient did have surgical material sent to pathology in February. This showed chronic abscess The patient also has lymphedema stage III in the left  greater than right lower extremities. He has a history of blisters with wounds but these of all were always healed. The patient thinks that the lymphedema may have been present since he was about 47 years old i.e. about 30 years. He does not have graded pressure stockings and has not worn stockings. He does not have a distant history of DVT PE or phlebitis. He has not been systemically unwell fever no chills. He states that his Lasix is recently been reduced. He tells me his kidney function is at "30%" and he has been followed by Dr. [name] of nephrology. At one point he was on Lasix 80 twice a day however that's been cut back and he is now on Lasix at 20 twice a day. The patient has a history of PAD listed in his records although he comes from Dr. [name] I don't think is felt to have significant PAD. ABIs in our clinic were noncompressible bilaterally. 01/04/18; patient has a large wound on the left lateral lower calf and a small wound on the left medial upper calf. He has been to see his nephrologist who changed him to Demadex 40 mg a day. I'm hopeful this will help with his systemic fluid overload. He also has stage III lymphedema. Really no change in the 2 wounds since last week 01/11/18; the patient is down 13 pounds. He put his stage III lymphedema left leg in 4K compression last week and there is less edema fluid however we still haven't been able to communicate with home health but apparently it is [facility] but the dressings have not been changed. The  patient noted an odor last week. He is also had compression pumps ordered by [facility] this as a not completed the paperwork stage. 01/18/18; patient continues to lose weight. Stage III lymphedema in the left greater than right leg under for alert compression. The major wound is on the left lateral ankle area. He apparently has bilateral compression pumps being brought to his house, these were ordered by [facility] Notable for the fact today he had some blisters on the right anterior leg together with some skin nodules. This is no doubt secondary to severe lymphedema. 01/25/18; the patient has obtained his compression pumps and is using them per vein and vascular instructions 3 times a day for an hour. He also saw [name] of vein and vascular. He was felt to have venous insufficiency but did not suggest any intervention also improved edema. It was suggested that he have compression stockings 20-30 mm on a daily basis in addition to compression pumps. [name], [name] ([id number]) The patient arrives in clinic today with a layer of unna under for layer compression. he seems to have some trouble with the degree of compression. He has open areas on the left lateral ankle area which is his major wound left upper medial calf and a superficial open area on the right anterior shin area which was blistered last week. He has skin changes on the right anterior calf which I think are no doubt secondary to lymphedema skin nodules etc. 02/01/18; the patient comes in telling us his nephrologist have to his torsemide. Unfortunately today he is put on 7 pounds by our scales. He has blisters all over the anterior and medial part of his right calf and a new open wound. He also has soupy green drainage coming out of the left lateral calf /ankle wound. 02/08/18; culture I did last week of the left lateral ankle wound grew both Pseudomonas and Morganella. He is on Keflex from Dr. Ola Spurr in the hospital. I will need to review these notes.in any case Keflex is not going to cover these 2 organisms. I'm probably going to added ciprofloxacin today for 1 week. A lot of drainage that looks purulent last week. He is not complaining of pain however he has managed to put on 10 pounds in 2 weeks by our scales in this clinic. He is going to see his nephrologist tomorrow 02/15/18; he completed the ciprofloxacin I gave him last week.  Notable that he is up to 379 pounds today which is up 16 pounds from 2 weeks ago. He is complaining of orthopnea but doesn't have any chest pain. 02/22/18; he continues to have weight gain. R intake nurse reports again purulent green drainage coming out of the lateral wound on the lateral left calf. He has small open area on the right anterior leg. His torsemide was increased to 2 tablets a day I believe this is 40 mg last week in response to the call admitted to Dr. Keturah Barre office. He follows up with Dr. [name] and Dr. Delana Meyer tomorrow 03/01/18 his weight essentially stable today at 383 pounds. Drainage out of the left lateral wound on the lower left calf/ankle is a lot less. Culture last time grew Pseudomonas. I put him on cefdinir. He has been to Dr. Keturah Barre office no adjustments in his diuretics. Dr. Nicoletta Dress prescribed a wraparound stocking for the right leg.there is no open area on the right leg. He has a superficial area on the left medial calf, left posterior  calf and in the large area on the left lateral however this looks better 03/15/18; weight is not up to 393 pounds. He saw his nephrologist yesterday Dr. [name] will increase the Demadex I'm hopeful this will help with the edema control. I'm using silver alginate to all his wounds. In particular the left lateral ankle looks better. oUnfortunately he has new open areas on the right lateral calf that will include use of his compression stocking at least in the short to medium term. He has new wounds o3 on the right lateral calf. One of these has some size however all numerous superficial 03/22/18; his weight is stabilized a bit. Just adjustment of his diuretics by his nephrologist. There is no doubt he has some degree of systemic fluid overload on top of severe left greater than right lymphedema. 2 weeks ago tried to transition him to stockings on the right leg however he developed re-breakdown of skin on the right leg and we had to put him back  in compression last week. He also uses external compression pumps and claims to be compliant Continued concern about his depression today 03/29/18; several ongoing issues with this patient; oHe no longer has home health coverage apparently secondary to a lapse in insurance. He is apparently transitioning from short for long-term disability. [facility] was changing his compression wraps on Monday and Friday. oHe has gained 10 pounds since last week oSees Dr. [name] tomorrow oSaw his primary doctor last week about the depression. It sounds as though he declined pharmacologic management. He seems somewhat better today. We were concerned last week when he came. Somewhat better today oHe is using his compression pumps once a day at home, I have asked for twice a day if possible especially on the left leg oFollows up with his nephrologist in mid-August. He is managing his diuretic for I think stage IV chronic renal failure oParadoxically his wounds actually look better 04/19/18; the patient has not been seen since I last saw him 3 weeks ago. He saw Dr. Delana Meyer of vascular surgery on 03/30/18 I believe he put him in a 20/30 stocking bilaterally with a wraparound extremitease stocking. He has not been putting anything specifically on the wound. More problematic than that he has not been wearing the stockings he is at home. He has been using his external compression pumps once per day according to him on a rare occasion twice On a psychosocial level the patient is now on long-term disability and is applying for COBRA therefore he is between insurances. He has not been able to follow up with Dr. [name] who is his nephrologist as a result. As noted his weight is up to 407 pounds today. He promises me he'll follow-up with Dr. [name] 05/03/18; he hasn't been here in 2 weeks now. He apparently has been wearing a compression sock on the left leg. Massive increase in edema 3 large open wounds on the left anterior  leg that were probably blisters. Significant deterioration in the left lateral ankle wound that we've been doing as his most problematic wound. He still does not have his insurance issues wrapped up. His weight is well over 400 pounds. 05/10/18; arrives today with better looking edema control in the left leg. He has been using his compression pumps twice a day. We also wrapped it is left leg for there he's been using his pumps so there is much better edema control. Most of new wounds from last week look a lot better. Even the  refractory area on the lower left lateral ankle looks a lot better to me today. [name], [name] ([id number]) He follows up with Dr. [name] this morning [nephrology] 05/17/18 patient arrives today with a lot less edema in the left leg. His weight is gone down 7 pounds. He tells me he saw Dr. [name] but his torsemide was not adjusted. He is using the palms 3 times a day. There is been quite an improvement in the remaining wound on the left lateral ankle oWeekly visit for follow-up of bilateral lower extremity wounds related to severe lymphedema and probably some degree of systemic fluid overload from chronic renal failure stage IV. His weight is up this week to over 400 pounds. He noted increasing edema in the right leg late last week. He took his compression stocking off he did not increase the frequency of this compression pump use. He developed a large blister on the back of the right calf. He also has a new opened blister on the left lateral calf in addition to the wound that we've been using on the distal left lower calf area and we've been using silver alginate. He tells me that he has an ultrasound which is a DVT rule out and I think a reflux study ordered by Dr. [name] 05/31/18; weekly visit. He went to see vein and vascular this week apparently they remove the 4-layer compression on the left and put an Unna boot on him in replacement. He's got more swelling in the left leg  is resolved. That being said his left lower Wound is better. He still has a fairly large wound on the right posterior calf this was not disturbed. He has a follow-up appointment with Dr. Keturah Barre nephrologist next week 06/07/2018; the patient arrives today with a history that he took both his compression wraps off 3 days ago in order to take a shower. He has bilateral severe tense blisters. A lot of weeping erythema especially on the lateral left calf. Almost circumferential blisters on the right. Multiple areas of epithelial breakdown. He does not complain of any pain fever chills. He states he has been using his compression pump in some form of stocking that I could not really determine the type. He did not come into the clinic with anything on his legs. He tells me he has an appointment with Dr. Merita Norton his nephrologist I believe this Friday. Paradoxically his weight is actually down to 385 pounds I believe last week he was over 400 06/14/18; arrives with better looking wound surfaces today on both legs. There is no further blistering however there are still areas that aren't epithelialized on the right anterior, right lateral, left posterior and left medial. His original wound just above the left ankle laterally is still open moist. His weight was about the same today. He tells me that his nephrologist increase the torsemide from 40/20/09/1939/40 twice a day 06/21/18; both his wraps fell down to his mid calf. Has a result he has multiple blisters across the anterior right leg above with the wraps ultimately watched. He also has blisters on the posterior part of the left calf o2. His original wound on the left lateral calf is hard to see any open area. There is however a divot. Which is going to be difficult to deal with into the future until the edema in the left leg is controlled. 2 small superficial areas remain He tells [place] his weight was 386 pounds on his scale at home. According to our scale he  is up 5 pounds. He is not on a fluid restriction Electronic Signature(s) Signed: 06/21/2018 4:09:45 PM By: Linton Ham MD Entered By: Linton Ham on 06/21/2018 10:03:53 [name], [name] ([id number]) -------------------------------------------------------------------------------- Physical Exam Details Patient Name: Stickels, Tremont A. Date of Service: 06/21/2018 9:15 AM Medical Record Number: [id number] Patient Account Number: [id number] Date of Birth/Sex: 02/07/1971 (47 y.o. M) Treating RN: Cornell Barman Primary Care Provider: Gaetano Net Other Clinician: Referring Provider: Gaetano Net Treating Provider/Extender: Tito Dine in Treatment: 25 Constitutional Patient is hypertensive.. Pulse regular and within target range for patient.[name] Respirations regular, non-labored and within target range.. Temperature is normal and within the target range for the patient.[name] appears in no distress. Eyes Conjunctivae clear. No discharge. Respiratory Respiratory effort is easy and symmetric bilaterally. Rate is normal at rest and on room air.. Cardiovascular JVP is elevated indicating systemic fluid volume overload. I can feel his peripheral pulses even through the swelling dorsalis pedis bilaterally. Lymphatic Palpable in the popliteal area bilaterally. Integumentary (Hair, Skin) Severe bilateral lymphedema left greater than right. Notes Exam oThere is no wound per se on the right however multiple blisters though they will no doubt evacuate into small open areas. oBlisters on the left posterior calf same comment as above. oHis original wound on the left lateral calf only has to small open areas. The rest of this is now in a divot of soft tissue above and below. It will be very difficult to see this until we get rid of some of the edema in the left leg Electronic Signature(s) Signed: 06/21/2018 4:09:45 PM By: Linton Ham MD Entered By: Linton Ham on 06/21/2018  10:07:52 [name], [name] Kitchen ([id number]) -------------------------------------------------------------------------------- Physician Orders Details Patient Name: Mccathern, Arjen A. Date of Service: 06/21/2018 9:15 AM Medical Record Number: [id number] Patient Account Number: [id number] Date of Birth/Sex: May 11, 1971 (47 y.o. M) Treating RN: Secundino Ginger Primary Care Provider: Gaetano Net Other Clinician: Referring Provider: Gaetano Net Treating Provider/Extender: Tito Dine in Treatment: 96 Verbal / Phone Orders: No Diagnosis Coding Wound Cleansing Wound #2 Left,Lateral,Posterior Lower Leg o Clean wound with Normal Saline. o Cleanse wound with mild soap and water Anesthetic (add to Medication List) Wound #2 Left,Lateral,Posterior Lower Leg o Topical Lidocaine 4% cream applied to wound bed prior to debridement (In Clinic Only). Primary Wound Dressing Wound #2 Left,Lateral,Posterior Lower Leg o Silver Alginate - on all draining areas Secondary Dressing Wound #2 Left,Lateral,Posterior Lower Leg o Other - absorbent dressing on all weeping areas Dressing Change Frequency Wound #2 Left,Lateral,Posterior Lower Leg o Change Dressing Monday, Wednesday, Friday - Wednesdays in [facility] Follow-up Appointments Wound #2 Left,Lateral,Posterior Lower Leg o Return Appointment in 1 week. o Nurse Visit as needed Edema Control Wound #2 Left,Lateral,Posterior Lower Leg o 4 Layer Compression System - Bilateral o Compression Pump: Use compression pump on left lower extremity for 30 minutes, twice daily. - 1 Hour twice Daily o Compression Pump: Use compression pump on right lower extremity for 30 minutes, twice daily. - 1 Hour twice daily Home Health Wound #2 Lasara for [place] Nurse may visit PRN to address patientos wound care needs. o FACE TO FACE ENCOUNTER: MEDICARE and MEDICAID  PATIENTS: I certify that this patient is under my care and that I had a face-to-face encounter that meets the physician face-to-face encounter requirements with this patient on this date. The encounter with the patient was in whole or in part for the following MEDICAL CONDITION: (  primary reason for Home Healthcare) MEDICAL NECESSITY: I certify, that based on my findings, Rion, Lovell A. ([id number]) NURSING services are a medically necessary home health service. HOME BOUND STATUS: I certify that my clinical findings support that this patient is homebound (i.e., Due to illness or injury, pt requires aid of supportive devices such as crutches, cane, wheelchairs, walkers, the use of special transportation or the assistance of another person to leave their place of residence. There is a normal inability to leave the home and doing so requires considerable and taxing effort. Other absences are for medical reasons / religious services and are infrequent or of short duration when for other reasons). o If current dressing causes regression in wound condition, may D/C ordered dressing product/s and apply Normal Saline Moist Dressing daily until next Cook / Other MD appointment. [place] of regression in wound condition at [phone]. o Please direct any NON-WOUND related issues/requests for orders to patient's Primary Care Physician Electronic Signature(s) Signed: 06/21/2018 4:09:45 PM By: Linton Ham MD Signed: 06/21/2018 4:45:51 PM By: Gretta Cool, BSN, RN, CWS, Kim RN, BSN Entered By: Gretta Cool, BSN, RN, CWS, Kim on 06/21/2018 13:18:30 [name], [name] Kitchen ([id number]) -------------------------------------------------------------------------------- Problem List Details Patient Name: [name], [name] Date of Service: 06/21/2018 9:15 AM Medical Record Number: [id number] Patient Account Number: [id number] Date of Birth/Sex: Aug 02, 1971 (47 y.o. 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 ([id number]) -------------------------------------------------------------------------------- Progress Note Details Patient Name: [name], [name] Date of Service: 06/21/2018 9:15 AM Medical Record Number: [id number] Patient Account Number: [id number] Date of Birth/Sex: 1971-06-15 (47 y.o. M) Treating RN: Cornell Barman Primary Care Provider: Gaetano Net Other Clinician: Referring Provider: Gaetano Net Treating Provider/Extender: Tito Dine in Treatment: 25 Subjective History of Present Illness (HPI) 12/28/17; this is a now 47 year old man who is a type II diabetic. He was hospitalized from 10/01/17 through 10/19/17. He had an MSSA soft tissue and skin infection. 2 open areas on the left leg were identified he has a smaller area on the left medial calf superiorly just below the knee and a wound just above the left ankle on the posterior medial aspect. I think both of these were surgical IandD sites when he was in the hospital. He was discharged with a wound VAC at that point however this is since been taken off. He follows with  Dr. Ola Spurr for the [facility] and he is still on chronic Keflex at 500 twice a day. At that time he was hospitalized his hemoglobin A1c was 15.1 however if I'm reading his endocrinologist notes correctly that is improved. He has been following with Dr. [name] at [facility] and he has been applying calcium alginate and Unna boots. He has home health changing the dressing. They have also been attempting to get him external compression  pumps although the patient is unaware whether they've been approved by insurance at this point. as mentioned he has a smaller clean wound on the right lateral calf just below the knee and he has a much larger area just above the left ankle medially and posteriorly. Our intake nurse reported greenish purulent looking drainage.the patient did have surgical material sent to pathology in February. This showed chronic abscess The patient also has lymphedema stage III in the left greater than right lower extremities. He has a history of blisters with wounds but these of all were always healed. The patient thinks that the lymphedema may have been present since he was about 47 years old i.e. about 30 years. He does not have graded pressure stockings and has not worn stockings. He does not have a distant history of DVT PE or phlebitis. He has not been systemically unwell fever no chills. He states that his Lasix is recently been reduced. He tells me his kidney function is at "30%" and he has been followed by Dr. [name] of nephrology. At one point he was on Lasix 80 twice a day however that's been cut back and he is now on Lasix at 20 twice a day. The patient has a history of PAD listed in his records although he comes from Dr. [name] I don't think is felt to have significant PAD. ABIs in our clinic were noncompressible bilaterally. 01/04/18; patient has a large wound on the left lateral lower calf and a small wound on the left medial upper calf. He has been to see his  nephrologist who changed him to Demadex 40 mg a day. I'm hopeful this will help with his systemic fluid overload. He also has stage III lymphedema. Really no change in the 2 wounds since last week 01/11/18; the patient is down 13 pounds. He put his stage III lymphedema left leg in 4K compression last week and there is less edema fluid however we still haven't been able to communicate with home health but apparently it is [facility] but the dressings have not been changed. The patient noted an odor last week. He is also had compression pumps ordered by [facility] this as a not completed the paperwork stage. 01/18/18; patient continues to lose weight. Stage III lymphedema in the left greater than right leg under for alert compression. The major wound is on the left lateral ankle area. He apparently has bilateral compression pumps being brought to his house, these were ordered by [facility] Notable for the fact today he had some blisters on the right anterior leg together with some skin nodules. This is no doubt secondary to severe lymphedema. 01/25/18; the patient has obtained his compression pumps and is using them per vein and vascular instructions 3 times a day for an hour. He also saw [name] of vein and vascular. He was felt to have venous insufficiency but did not suggest any intervention also improved edema. It was suggested that he have compression stockings 20-30 mm on a daily basis in addition to compression pumps. The patient arrives in clinic today with a layer of unna under for layer compression. he seems to have some trouble with the degree of compression. He has open areas on the left lateral ankle area which is his major wound left upper medial calf and a Ytuarte, Domenik A. ([id number]) superficial open area on the right anterior shin area which was blistered last week. He has skin changes on the right anterior calf which  I think are no doubt secondary to  lymphedema skin nodules etc. 02/01/18; the patient comes in telling us his nephrologist have to his torsemide. Unfortunately today he is put on 7 pounds by our scales. He has blisters all over the anterior and medial part of his right calf and a new open wound. He also has soupy green drainage coming out of the left lateral calf /ankle wound. 02/08/18; culture I did last week of the left lateral ankle wound grew both Pseudomonas and Morganella. He is on Keflex from Dr. Ola Spurr in the hospital. I will need to review these notes.in any case Keflex is not going to cover these 2 organisms. I'm probably going to added ciprofloxacin today for 1 week. A lot of drainage that looks purulent last week. He is not complaining of pain however he has managed to put on 10 pounds in 2 weeks by our scales in this clinic. He is going to see his nephrologist tomorrow 02/15/18; he completed the ciprofloxacin I gave him last week. Notable that he is up to 379 pounds today which is up 16 pounds from 2 weeks ago. He is complaining of orthopnea but doesn't have any chest pain. 02/22/18; he continues to have weight gain. R intake nurse reports again purulent green drainage coming out of the lateral wound on the lateral left calf. He has small open area on the right anterior leg. His torsemide was increased to 2 tablets a day I believe this is 40 mg last week in response to the call admitted to Dr. Keturah Barre office. He follows up with Dr. [name] and Dr. Delana Meyer tomorrow 03/01/18 his weight essentially stable today at 383 pounds. Drainage out of the left lateral wound on the lower left calf/ankle is a lot less. Culture last time grew Pseudomonas. I put him on cefdinir. He has been to Dr. Keturah Barre office no adjustments in his diuretics. Dr. Nicoletta Dress prescribed a wraparound stocking for the right leg.there is no open area on the right leg. He has a superficial area on the left medial calf, left posterior calf and in the large area on  the left lateral however this looks better 03/15/18; weight is not up to 393 pounds. He saw his nephrologist yesterday Dr. [name] will increase the Demadex I'm hopeful this will help with the edema control. I'm using silver alginate to all his wounds. In particular the left lateral ankle looks better. Unfortunately he has new open areas on the right lateral calf that will include use of his compression stocking at least in the short to medium term. He has new wounds o3 on the right lateral calf. One of these has some size however all numerous superficial 03/22/18; his weight is stabilized a bit. Just adjustment of his diuretics by his nephrologist. There is no doubt he has some degree of systemic fluid overload on top of severe left greater than right lymphedema. 2 weeks ago tried to transition him to stockings on the right leg however he developed re-breakdown of skin on the right leg and we had to put him back in compression last week. He also uses external compression pumps and claims to be compliant Continued concern about his depression today 03/29/18; several ongoing issues with this patient; He no longer has home health coverage apparently secondary to a lapse in insurance. He is apparently transitioning from short for long-term disability. [facility] was changing his compression wraps on Monday and Friday. He has gained 10 pounds since last week Sees Dr.  [name] tomorrow Saw his primary doctor last week about the depression. It sounds as though he declined pharmacologic management. He seems somewhat better today. We were concerned last week when he came. Somewhat better today He is using his compression pumps once a day at home, I have asked for twice a day if possible especially on the left leg Follows up with his nephrologist in mid-August. He is managing his diuretic for I think stage IV chronic renal failure Paradoxically his wounds actually look better 04/19/18; the patient has  not been seen since I last saw him 3 weeks ago. He saw Dr. Delana Meyer of vascular surgery on 03/30/18 I believe he put him in a 20/30 stocking bilaterally with a wraparound extremitease stocking. He has not been putting anything specifically on the wound. More problematic than that he has not been wearing the stockings he is at home. He has been using his external compression pumps once per day according to him on a rare occasion twice On a psychosocial level the patient is now on long-term disability and is applying for COBRA therefore he is between insurances. He has not been able to follow up with Dr. [name] who is his nephrologist as a result. As noted his weight is up to 407 pounds today. He promises me he'll follow-up with Dr. [name] 05/03/18; he hasn't been here in 2 weeks now. He apparently has been wearing a compression sock on the left leg. Massive increase in edema 3 large open wounds on the left anterior leg that were probably blisters. Significant deterioration in the left lateral ankle wound that we've been doing as his most problematic wound. He still does not have his insurance issues wrapped up. His weight is well over 400 pounds. 05/10/18; arrives today with better looking edema control in the left leg. He has been using his compression pumps twice a day. We also wrapped it is left leg for there he's been using his pumps so there is much better edema control. Most of new wounds from last week look a lot better. Even the refractory area on the lower left lateral ankle looks a lot better to me today. He follows up with Dr. [name] this morning [nephrology] 05/17/18 patient arrives today with a lot less edema in the left leg. His weight is gone down 7 pounds. He tells me he saw Dr. Helene Kelp, Finn A. ([id number]) [name] but his torsemide was not adjusted. He is using the palms 3 times a day. There is been quite an improvement in the remaining wound on the left lateral ankle Weekly visit for follow-up  of bilateral lower extremity wounds related to severe lymphedema and probably some degree of systemic fluid overload from chronic renal failure stage IV. His weight is up this week to over 400 pounds. He noted increasing edema in the right leg late last week. He took his compression stocking off he did not increase the frequency of this compression pump use. He developed a large blister on the back of the right calf. He also has a new opened blister on the left lateral calf in addition to the wound that we've been using on the distal left lower calf area and we've been using silver alginate. He tells me that he has an ultrasound which is a DVT rule out and I think a reflux study ordered by Dr. [name] 05/31/18; weekly visit. He went to see vein and vascular this week apparently they remove the 4-layer compression on the left and put an  Unna boot on him in replacement. He's got more swelling in the left leg is resolved. That being said his left lower Wound is better. He still has a fairly large wound on the right posterior calf this was not disturbed. He has a follow-up appointment with Dr. Keturah Barre nephrologist next week 06/07/2018; the patient arrives today with a history that he took both his compression wraps off 3 days ago in order to take a shower. He has bilateral severe tense blisters. A lot of weeping erythema especially on the lateral left calf. Almost circumferential blisters on the right. Multiple areas of epithelial breakdown. He does not complain of any pain fever chills. He states he has been using his compression pump in some form of stocking that I could not really determine the type. He did not come into the clinic with anything on his legs. He tells me he has an appointment with Dr. Merita Norton his nephrologist I believe this Friday. Paradoxically his weight is actually down to 385 pounds I believe last week he was over 400 06/14/18; arrives with better looking wound surfaces today on both  legs. There is no further blistering however there are still areas that aren't epithelialized on the right anterior, right lateral, left posterior and left medial. His original wound just above the left ankle laterally is still open moist. His weight was about the same today. He tells me that his nephrologist increase the torsemide from 40/20/09/1939/40 twice a day 06/21/18; both his wraps fell down to his mid calf. Has a result he has multiple blisters across the anterior right leg above with the wraps ultimately watched. He also has blisters on the posterior part of the left calf o2. His original wound on the left lateral calf is hard to see any open area. There is however a divot. Which is going to be difficult to deal with into the future until the edema in the left leg is controlled. 2 small superficial areas remain He tells [place] his weight was 386 pounds on his scale at home. According to our scale he is up 5 pounds. He is not on a fluid restriction Objective Constitutional Patient is hypertensive.. Pulse regular and within target range for patient.[name] Respirations regular, non-labored and within target range.. Temperature is normal and within the target range for the patient.[name] appears in no distress. Vitals Time Taken: 9:01 AM, Height: 76 in, Weight: 385.9 lbs, BMI: 47, Temperature: 98.2 F, Pulse: 54 bpm, Respiratory Rate: 18 breaths/min, Blood Pressure: 155/66 mmHg. Eyes Conjunctivae clear. No discharge. Respiratory Respiratory effort is easy and symmetric bilaterally. Rate is normal at rest and on room air.. Cardiovascular JVP is elevated indicating systemic fluid volume overload. I can feel his peripheral pulses even through the swelling dorsalis pedis bilaterally. [name], [name] ([id number]) Lymphatic Palpable in the popliteal area bilaterally. General Notes: Exam There is no wound per se on the right however multiple blisters though they will no doubt evacuate into small open  areas. Blisters on the left posterior calf same comment as above. His original wound on the left lateral calf only has to small open areas. The rest of this is now in a divot of soft tissue above and below. It will be very difficult to see this until we get rid of some of the edema in the left leg Integumentary (Hair, Skin) Severe bilateral lymphedema left greater than right. Wound #2 status is Open. Original cause of wound was Gradually Appeared. The wound is located on the Ashley  Leg. The wound measures 1.4cm length x 5cm width x 1cm depth; 5.498cm^2 area and 5.498cm^3 volume. There is no tunneling or undermining noted. There is a medium amount of serous drainage noted. There is no granulation within the wound bed. There is a large (67-100%) amount of necrotic tissue within the wound bed including Adherent Slough. The periwound skin appearance exhibited: Maceration. The periwound skin appearance did not exhibit: Callus, Crepitus, Excoriation, Induration, Rash, Scarring, Dry/Scaly, Atrophie Blanche, Cyanosis, Ecchymosis, Hemosiderin Staining, Mottled, Pallor, Rubor, Erythema. Assessment Active Problems ICD-10 Non-pressure chronic ulcer of left calf with necrosis of muscle Lymphedema, not elsewhere classified Chronic venous hypertension (idiopathic) with ulcer and inflammation of left lower extremity Type 2 diabetes mellitus with other skin ulcer Non-pressure chronic ulcer of right calf limited to breakdown of skin Diagnoses ICD-10 L97.223: Non-pressure chronic ulcer of left calf with necrosis of muscle I89.0: Lymphedema, not elsewhere classified I87.332: Chronic venous hypertension (idiopathic) with ulcer and inflammation of left lower extremity E11.622: Type 2 diabetes mellitus with other skin ulcer L97.211: Non-pressure chronic ulcer of right calf limited to breakdown of skin Procedures Wound #2 Pre-procedure diagnosis of Wound #2 is a Diabetic Wound/Ulcer of the  Lower Extremity located on the Left,Lateral,Posterior Lower Leg . There was a Four Layer Compression Therapy Procedure by Secundino Ginger, RN. Post procedure Diagnosis Wound #2: Same as Pre-Procedure [name], [name] ([id number]) Plan Wound Cleansing: Wound #2 Left,Lateral,Posterior Lower Leg: Clean wound with Normal Saline. Clean wound with Normal Saline. Cleanse wound with mild soap and water Cleanse wound with mild soap and water Anesthetic (add to Medication List): Wound #2 Left,Lateral,Posterior Lower Leg: Topical Lidocaine 4% cream applied to wound bed prior to debridement (In Clinic Only). Primary Wound Dressing: Other: - Bilateral Legs - weeping areas Wound #2 Left,Lateral,Posterior Lower Leg: Silver Alginate Secondary Dressing: Wound #2 Left,Lateral,Posterior Lower Leg: Other - absorbent dressing on all weeping areas Dressing Change Frequency: Wound #2 Left,Lateral,Posterior Lower Leg: Change dressing every week Other: - nurse visit if needed Follow-up Appointments: Wound #2 Left,Lateral,Posterior Lower Leg: Return Appointment in 1 week. Nurse Visit as needed Edema Control: Wound #2 Left,Lateral,Posterior Lower Leg: 4 Layer Compression System - Bilateral Compression Pump: Use compression pump on left lower extremity for 30 minutes, twice daily. - 1 Hour twice Daily Compression Pump: Use compression pump on right lower extremity for 30 minutes, twice daily. - 1 Hour twice daily #1 silver alginate/ABDs/4-layer compression #2 he tells me he is using his external compression pumps twice a day #3 there is still evidence of systemic fluid volume overload. I don't think we are going to be able to keep his legs free of new wounds and/or blisters and/or lymphedema and until he gets started on dialysis and as I understand things they are only now planning for his shunt Electronic Signature(s) Signed: 06/21/2018 4:09:45 PM By: Linton Ham MD Entered By: Linton Ham on  06/21/2018 10:10:30 [name], [name] ([id number]) -------------------------------------------------------------------------------- SuperBill Details Patient Name: [name], [name] A. Date of Service: 06/21/2018 Medical Record Number: [id number] Patient Account Number: [id number] Date of Birth/Sex: Oct 12, 1970 (47 y.o. M) Treating RN: Secundino Ginger Primary Care Provider: Gaetano Net Other Clinician: Referring Provider: Gaetano Net Treating Provider/Extender: Tito Dine in Treatment: 25 Diagnosis Coding ICD-10 Codes Code Description [phone] Non-pressure chronic ulcer of left calf with necrosis of muscle I89.0 Lymphedema, not elsewhere classified I87.332 Chronic venous hypertension (idiopathic) with ulcer and inflammation of left lower extremity E11.622 Type 2 diabetes mellitus with other skin ulcer L97.211 Non-pressure chronic ulcer of  right calf limited to breakdown of skin Facility Procedures CPT4: Description Modifier Quantity Code [id number] BILATERAL: Application of multi-layer venous compression system; leg (below 1 knee), including ankle and foot. Physician Procedures CPT4 Code: [id number] Description: 90228 - WC PHYS LEVEL 3 - EST PT ICD-10 Diagnosis Description L97.223 Non-pressure chronic ulcer of left calf with necrosis of mu L97.211 Non-pressure chronic ulcer of right calf limited to breakdo I89.0 Lymphedema, not elsewhere classified Modifier: scle wn of skin Quantity: 1 Electronic Signature(s) Signed: 06/21/2018 4:09:45 PM By: Linton Ham MD Entered By: Linton Ham on 06/21/2018 10:11:03

## 2018-06-23 NOTE — Telephone Encounter (Signed)
Patient called and stated that at his last appointment with Dr. Delana Meyer, that they discussed him having to have a fistula put in, and that he has not heard anything back on getting scheduled for this and that he's not heard anything about getting set-up for his cardiac clearance?

## 2018-06-23 NOTE — Telephone Encounter (Signed)
I will talk with Dr. Delana Meyer again regarding this patient's surgery type.

## 2018-06-27 NOTE — Telephone Encounter (Signed)
I need to study his vein mapping

## 2018-06-28 ENCOUNTER — Encounter: Payer: BLUE CROSS/BLUE SHIELD | Admitting: Internal Medicine

## 2018-06-28 DIAGNOSIS — E11622 Type 2 diabetes mellitus with other skin ulcer: Secondary | ICD-10-CM | POA: Diagnosis not present

## 2018-06-29 ENCOUNTER — Ambulatory Visit (INDEPENDENT_AMBULATORY_CARE_PROVIDER_SITE_OTHER): Payer: BLUE CROSS/BLUE SHIELD | Admitting: Vascular Surgery

## 2018-07-01 NOTE — Progress Notes (Signed)
[name], [name] ([id number]) Visit Report for 06/28/2018 HPI Details Patient Name: [name], [name] Date of Service: 06/28/2018 12:45 PM Medical Record Number: [id number] Patient Account Number: [id number] Date of Birth/Sex: 21-Nov-1970 (47 y.o. M) Treating RN: [name] Primary Care Provider: Gaetano [name] Other Clinician: Referring Provider: Gaetano [name] Treating Provider/Extender: [name] in Treatment: 26 History of Present Illness HPI Description: 12/28/17; this is a now 47 year old man who is a type II diabetic. He was hospitalized from 10/01/17 through 10/19/17. He had an MSSA soft tissue and skin infection. 2 open areas on the left leg were identified he has a smaller area on the left medial calf superiorly just below the knee and a wound just above the left ankle on the posterior medial aspect. I think both of these were surgical IandD sites when he was in the hospital. He was discharged with a wound VAC at that point however this is since been taken off. He follows with Dr. Ola [name] for the [facility] and he is still on chronic Keflex at 500 twice a day. At that time he was hospitalized his hemoglobin A1c was 15.1 however if I'm reading his endocrinologist notes correctly that is improved. He has been following with Dr. Ronalee [name] at [facility] and he has been applying calcium alginate and Unna boots. He has home health changing the dressing. They have also been attempting to get him external compression pumps although the patient is unaware whether they've been approved by insurance at this point. as mentioned he has a smaller clean wound on the right lateral calf just below the knee and he has a much larger area just above the left ankle medially and posteriorly. Our intake nurse reported greenish purulent looking drainage.the patient did have surgical material sent to pathology in February. This showed chronic abscess The patient also has lymphedema stage III in the left  greater than right lower extremities. He has a history of blisters with wounds but these of all were always healed. The patient thinks that the lymphedema may have been present since he was about 47 years old i.e. about 30 years. He does not have graded pressure stockings and has not worn stockings. He does not have a distant history of DVT PE or phlebitis. He has not been systemically unwell fever no chills. He states that his Lasix is recently been reduced. He tells me his kidney function is at "30%" and he has been followed by [name] of nephrology. At one point he was on Lasix 80 twice a day however that's been cut back and he is now on Lasix at 20 twice a day. The patient has a history of PAD listed in his records although he comes from Dr. Ronalee [name] I don't think is felt to have significant PAD. ABIs in our clinic were noncompressible bilaterally. 01/04/18; patient has a large wound on the left lateral lower calf and a small wound on the left medial upper calf. He has been to see his nephrologist who changed him to Demadex 40 mg a day. I'm hopeful this will help with his systemic fluid overload. He also has stage III lymphedema. Really no change in the 2 wounds since last week 01/11/18; the patient is down 13 pounds. He put his stage III lymphedema left leg in 4K compression last week and there is less edema fluid however we still haven't been able to communicate with home health but apparently it is [facility] but the dressings have not been changed. The  patient noted an odor last week. He is also had compression pumps ordered by [facility] this as a not completed the paperwork stage. 01/18/18; patient continues to lose weight. Stage III lymphedema in the left greater than right leg under for alert compression. The major wound is on the left lateral ankle area. He apparently has bilateral compression pumps being brought to his house, these were ordered by [facility] Notable for the fact today he had some blisters on the right anterior leg together with some skin nodules. This is no doubt secondary to severe lymphedema. 01/25/18; the patient has obtained his compression pumps and is using them per vein and vascular instructions 3 times a day for an hour. He also saw [name] of vein and vascular. He was felt to have venous insufficiency but did not suggest any intervention also improved edema. It was suggested that he have compression stockings 20-30 mm on a daily basis in addition to compression pumps. [name], [name] ([id number]) The patient arrives in clinic today with a layer of unna under for layer compression. he seems to have some trouble with the degree of compression. He has open areas on the left lateral ankle area which is his major wound left upper medial calf and a superficial open area on the right anterior shin area which was blistered last week. He has skin changes on the right anterior calf which I think are no doubt secondary to lymphedema skin nodules etc. 02/01/18; the patient comes in telling us his nephrologist have to his torsemide. Unfortunately today he is put on 7 pounds by our scales. He has blisters all over the anterior and medial part of his right calf and a new open wound. He also has soupy green drainage coming out of the left lateral calf /ankle wound. 02/08/18; culture I did last week of the left lateral ankle wound grew both Pseudomonas and Morganella. He is on Keflex from Dr. Ola [name] in the hospital. I will need to review these notes.in any case Keflex is not going to cover these 2 organisms. I'm probably going to added ciprofloxacin today for 1 week. A lot of drainage that looks purulent last week. He is not complaining of pain however he has managed to put on 10 pounds in 2 weeks by our scales in this clinic. He is going to see his nephrologist tomorrow 02/15/18; he completed the ciprofloxacin I gave him last week.  Notable that he is up to 379 pounds today which is up 16 pounds from 2 weeks ago. He is complaining of orthopnea but doesn't have any chest pain. 02/22/18; he continues to have weight gain. R intake nurse reports again purulent green drainage coming out of the lateral wound on the lateral left calf. He has small open area on the right anterior leg. His torsemide was increased to 2 tablets a day I believe this is 40 mg last week in response to the call admitted to Dr. Keturah Barre office. He follows up with [name] and Dr. Delana Meyer tomorrow 03/01/18 his weight essentially stable today at 383 pounds. Drainage out of the left lateral wound on the lower left calf/ankle is a lot less. Culture last time grew Pseudomonas. I put him on cefdinir. He has been to Dr. Keturah Barre office no adjustments in his diuretics. Dr. Nicoletta Dress prescribed a wraparound stocking for the right leg.there is no open area on the right leg. He has a superficial area on the left medial calf, left posterior  calf and in the large area on the left lateral however this looks better 03/15/18; weight is not up to 393 pounds. He saw his nephrologist yesterday [name] will increase the Demadex I'm hopeful this will help with the edema control. I'm using silver alginate to all his wounds. In particular the left lateral ankle looks better. oUnfortunately he has new open areas on the right lateral calf that will include use of his compression stocking at least in the short to medium term. He has new wounds o3 on the right lateral calf. One of these has some size however all numerous superficial 03/22/18; his weight is stabilized a bit. Just adjustment of his diuretics by his nephrologist. There is no doubt he has some degree of systemic fluid overload on top of severe left greater than right lymphedema. 2 weeks ago tried to transition him to stockings on the right leg however he developed re-breakdown of skin on the right leg and we had to put him back  in compression last week. He also uses external compression pumps and claims to be compliant Continued concern about his depression today 03/29/18; several ongoing issues with this patient; oHe no longer has home health coverage apparently secondary to a lapse in insurance. He is apparently transitioning from short for long-term disability. [facility] was changing his compression wraps on Monday and Friday. oHe has gained 10 pounds since last week oSees Dr. Ronalee [name] tomorrow oSaw his primary doctor last week about the depression. It sounds as though he declined pharmacologic management. He seems somewhat better today. We were concerned last week when he came. Somewhat better today oHe is using his compression pumps once a day at home, I have asked for twice a day if possible especially on the left leg oFollows up with his nephrologist in mid-August. He is managing his diuretic for I think stage IV chronic renal failure oParadoxically his wounds actually look better 04/19/18; the patient has not been seen since I last saw him 3 weeks ago. He saw Dr. Delana Meyer of vascular surgery on 03/30/18 I believe he put him in a 20/30 stocking bilaterally with a wraparound extremitease stocking. He has not been putting anything specifically on the wound. More problematic than that he has not been wearing the stockings he is at home. He has been using his external compression pumps once per day according to him on a rare occasion twice On a psychosocial level the patient is now on long-term disability and is applying for COBRA therefore he is between insurances. He has not been able to follow up with [name] who is his nephrologist as a result. As noted his weight is up to 407 pounds today. He promises me he'll follow-up with [name] 05/03/18; he hasn't been here in 2 weeks now. He apparently has been wearing a compression sock on the left leg. Massive increase in edema 3 large open wounds on the left anterior  leg that were probably blisters. Significant deterioration in the left lateral ankle wound that we've been doing as his most problematic wound. He still does not have his insurance issues wrapped up. His weight is well over 400 pounds. 05/10/18; arrives today with better looking edema control in the left leg. He has been using his compression pumps twice a day. We also wrapped it is left leg for there he's been using his pumps so there is much better edema control. Most of new wounds from last week look a lot better. Even the  refractory area on the lower left lateral ankle looks a lot better to me today. [name], [name] ([id number]) He follows up with [name] this morning [nephrology] 05/17/18 patient arrives today with a lot less edema in the left leg. His weight is gone down 7 pounds. He tells me he saw [name] but his torsemide was not adjusted. He is using the palms 3 times a day. There is been quite an improvement in the remaining wound on the left lateral ankle oWeekly visit for follow-up of bilateral lower extremity wounds related to severe lymphedema and probably some degree of systemic fluid overload from chronic renal failure stage IV. His weight is up this week to over 400 pounds. He noted increasing edema in the right leg late last week. He took his compression stocking off he did not increase the frequency of this compression pump use. He developed a large blister on the back of the right calf. He also has a new opened blister on the left lateral calf in addition to the wound that we've been using on the distal left lower calf area and we've been using silver alginate. He tells me that he has an ultrasound which is a DVT rule out and I think a reflux study ordered by Dr. Ronalee [name] 05/31/18; weekly visit. He went to see vein and vascular this week apparently they remove the 4-layer compression on the left and put an Unna boot on him in replacement. He's got more swelling in the left leg  is resolved. That being said his left lower Wound is better. He still has a fairly large wound on the right posterior calf this was not disturbed. He has a follow-up appointment with Dr. Keturah Barre nephrologist next week 06/07/2018; the patient arrives today with a history that he took both his compression wraps off 3 days ago in order to take a shower. He has bilateral severe tense blisters. A lot of weeping erythema especially on the lateral left calf. Almost circumferential blisters on the right. Multiple areas of epithelial breakdown. He does not complain of any pain fever chills. He states he has been using his compression pump in some form of stocking that I could not really determine the type. He did not come into the clinic with anything on his legs. He tells me he has an appointment with Dr. Merita Norton his nephrologist I believe this Friday. Paradoxically his weight is actually down to 385 pounds I believe last week he was over 400 06/14/18; arrives with better looking wound surfaces today on both legs. There is no further blistering however there are still areas that aren't epithelialized on the right anterior, right lateral, left posterior and left medial. His original wound just above the left ankle laterally is still open moist. His weight was about the same today. He tells me that his nephrologist increase the torsemide from 40/20/09/1939/40 twice a day 06/21/18; both his wraps fell down to his mid calf. Has a result he has multiple blisters across the anterior right leg above with the wraps ultimately watched. He also has blisters on the posterior part of the left calf o2. His original wound on the left lateral calf is hard to see any open area. There is however a divot. Which is going to be difficult to deal with into the future until the edema in the left leg is controlled. 2 small superficial areas remain He tells [place] his weight was 386 pounds on his scale at home. According to our scale he  is up 5 pounds. He is not on a fluid restriction 06/28/18; patient's weight is gone up 2 pounds since last time. He has weeping edema and open superficial wounds on the right posterior right lateral and right anterior calf. He has a small open area on the left anterior probably left posterior calf and the original wound on the left lateral calf appears to be just about closed He is being planned for a dialysis shunt in the right arm through [facility] incoordination with his nephrologist [name] He claims to be using his compression pumps twice a day. He has lymphedema and no doubt systemic fluid volume overload from stage IV chronic renal failure He is Electronic Signature(s) Signed: 06/28/2018 5:32:05 PM By: Linton Ham MD Entered By: Linton Ham on 06/28/2018 13:20:14 [name], [name] ([id number]) -------------------------------------------------------------------------------- Physical Exam Details Patient Name: [name], [name] Date of Service: 06/28/2018 12:45 PM Medical Record Number: [id number] Patient Account Number: [id number] Date of Birth/Sex: Jun 10, 1971 (48 y.o. M) Treating RN: [name] Primary Care Provider: Gaetano [name] Other Clinician: Referring Provider: Gaetano [name] Treating Provider/Extender: [name] in Treatment: 28 Constitutional Patient is hypertensive.. Pulse regular and within target range for patient.[name] Respirations regular, non-labored and within target range.. Temperature is normal and within the target range for the patient.[name] appears in no distress. Eyes Conjunctivae clear. No discharge. Respiratory Respiratory effort is easy and symmetric bilaterally. Rate is normal at rest and on room air.. Cardiovascular JVP is elevated. Dorsalis pedis pulses are palpable bilaterally. Severe lymphedema of the left greater than right calf. There is pitting edema in the thighs indicative of fluid volume overload. Integumentary (Hair,  Skin) No primary skin issues are seen. Psychiatric Patient appears depressed today.. Notes Wound exam; multiple open areas on the right anterior, laterally and posteriorly. On the left anteriorly and posteriorly. He has original wound on the left distal lateral calf which seems just about closed but varied in lymphedema fluid. Electronic Signature(s) Signed: 06/28/2018 5:32:05 PM By: Linton Ham MD Entered By: Linton Ham on 06/28/2018 13:23:56 [name], [name] AMarland Kitchen ([id number]) -------------------------------------------------------------------------------- Physician Orders Details Patient Name: [name], [name] Date of Service: 06/28/2018 12:45 PM Medical Record Number: [id number] Patient Account Number: [id number] Date of Birth/Sex: [birth year] (47 y.o. M) Treating RN: [name] Primary Care Provider: Gaetano [name] Other Clinician: Referring Provider: Gaetano [name] Treating Provider/Extender: [name] in Treatment: 24 Verbal / Phone Orders: No Diagnosis Coding Wound Cleansing Wound #2 Left,Lateral,Posterior Lower Leg o Clean wound with Normal Saline. o Cleanse wound with mild soap and water Anesthetic (add to Medication List) Wound #2 Left,Lateral,Posterior Lower Leg o Topical Lidocaine 4% cream applied to wound bed prior to debridement (In Clinic Only). Primary Wound Dressing Wound #2 Left,Lateral,Posterior Lower Leg o Silver Alginate - on all draining areas Secondary Dressing Wound #2 Left,Lateral,Posterior Lower Leg o Other - absorbent dressing on all weeping areas Dressing Change Frequency Wound #2 Left,Lateral,Posterior Lower Leg o Change Dressing Monday, Wednesday, Friday - Every 2 weeks on Wednesdays in [facility] Follow-up Appointments Wound #2 Left,Lateral,Posterior Lower Leg o Return Appointment in 2 weeks. o Nurse Visit as needed Edema Control Wound #2 Left,Lateral,Posterior Lower Leg o 4 Layer Compression System -  Bilateral o Compression Pump: Use compression pump on left lower extremity for 30 minutes, twice daily. - 1 Hour three times Daily o Compression Pump: Use compression pump on right lower extremity for 30 minutes, twice daily. - 1 Hour three times daily Home Health Wound #2 TMLY,YTKPTWS,FKCLEXNTZ  Lower Leg o Continue Home Health Visits - [facility]: Monday, Wednesday and Friday. Patient comes to [facility] every other week on Wednesday. o Home Health Nurse may visit PRN to address patientos wound care needs. o FACE TO FACE ENCOUNTER: MEDICARE and MEDICAID PATIENTS: I certify that this patient is under my care and that I had a face-to-face encounter that meets the physician face-to-face encounter requirements with this patient on this date. The encounter with the patient was in whole or in part for the following [place]. ([id number]) CONDITION: (primary reason for Home Healthcare) MEDICAL NECESSITY: I certify, that based on my findings, NURSING services are a medically necessary home health service. HOME BOUND STATUS: I certify that my clinical findings support that this patient is homebound (i.e., Due to illness or injury, pt requires aid of supportive devices such as crutches, cane, wheelchairs, walkers, the use of special transportation or the assistance of another person to leave their place of residence. There is a normal inability to leave the home and doing so requires considerable and taxing effort. Other absences are for medical reasons / religious services and are infrequent or of short duration when for other reasons). o If current dressing causes regression in wound condition, may D/C ordered dressing product/s and apply Normal Saline Moist Dressing daily until next [place] / Other MD appointment. [place] of regression in wound condition at [phone]. o Please direct any NON-WOUND related issues/requests for orders  to patient's Primary Care Physician Electronic Signature(s) Signed: 06/28/2018 5:32:05 PM By: Linton Ham MD Signed: 06/29/2018 7:26:08 AM By: Gretta Cool, BSN, RN, CWS, Kim RN, BSN Entered By: Gretta Cool, BSN, RN, CWS, Kim on 06/28/2018 13:49:25 [name], [name] AMarland Kitchen ([id number]) -------------------------------------------------------------------------------- Problem List Details Patient Name: Bockrath, Alano A. Date of Service: 06/28/2018 12:45 PM Medical Record Number: [id number] Patient Account Number: [id number] Date of Birth/Sex: Jan 18, 1971 (47 y.o. M) Treating RN: [name] Primary Care Provider: Gaetano [name] Other Clinician: Referring Provider: Gaetano [name] Treating Provider/Extender: [name] in Treatment: 26 Active Problems ICD-10 Evaluated Encounter Code Description Active Date Today Diagnosis L97.223 Non-pressure chronic ulcer of left calf with necrosis of muscle 12/28/2017 No Yes I89.0 Lymphedema, not elsewhere classified 12/28/2017 No Yes I87.332 Chronic venous hypertension (idiopathic) with ulcer and 12/28/2017 No Yes inflammation of left lower extremity E11.622 Type 2 diabetes mellitus with other skin ulcer 12/28/2017 No Yes L97.211 Non-pressure chronic ulcer of right calf limited to breakdown 02/22/2018 No Yes of skin Inactive Problems Resolved Problems Electronic Signature(s) Signed: 06/28/2018 5:32:05 PM By: Linton Ham MD Entered By: Linton Ham on 06/28/2018 13:16:42 Dorff, [place]. ([id number]) -------------------------------------------------------------------------------- Progress Note Details Patient Name: Shaub, Armonte A. Date of Service: 06/28/2018 12:45 PM Medical Record Number: [id number] Patient Account Number: [id number] Date of Birth/Sex: Oct 01, 1970 (47 y.o. M) Treating RN: [name] Primary Care Provider: Gaetano [name] Other Clinician: Referring Provider: Gaetano [name] Treating Provider/Extender: [name] in Treatment:  26 Subjective History of Present Illness (HPI) 12/28/17; this is a now 47 year old man who is a type II diabetic. He was hospitalized from 10/01/17 through 10/19/17. He had an MSSA soft tissue and skin infection. 2 open areas on the left leg were identified he has a smaller area on the left medial calf superiorly just below the knee and a wound just above the left ankle on the posterior medial aspect. I think both of these were surgical IandD sites when he was in the hospital. He was discharged with a wound  VAC at that point however this is since been taken off. He follows with Dr. Ola [name] for the [facility] and he is still on chronic Keflex at 500 twice a day. At that time he was hospitalized his hemoglobin A1c was 15.1 however if I'm reading his endocrinologist notes correctly that is improved. He has been following with Dr. Ronalee [name] at vein and vascular and he has been applying calcium alginate and Unna boots. He has home health changing the dressing. They have also been attempting to get him external compression pumps although the patient is unaware whether they've been approved by insurance at this point. as mentioned he has a smaller clean wound on the right lateral calf just below the knee and he has a much larger area just above the left ankle medially and posteriorly. Our intake nurse reported greenish purulent looking drainage.the patient did have surgical material sent to pathology in February. This showed chronic abscess The patient also has lymphedema stage III in the left greater than right lower extremities. He has a history of blisters with wounds but these of all were always healed. The patient thinks that the lymphedema may have been present since he was about 47 years old i.e. about 30 years. He does not have graded pressure stockings and has not worn stockings. He does not have a distant history of DVT PE or phlebitis. He has not been systemically unwell fever no chills. He states that  his Lasix is recently been reduced. He tells me his kidney function is at "30%" and he has been followed by [name] of nephrology. At one point he was on Lasix 80 twice a day however that's been cut back and he is now on Lasix at 20 twice a day. The patient has a history of PAD listed in his records although he comes from Dr. Ronalee [name] I don't think is felt to have significant PAD. ABIs in our clinic were noncompressible bilaterally. 01/04/18; patient has a large wound on the left lateral lower calf and a small wound on the left medial upper calf. He has been to see his nephrologist who changed him to Demadex 40 mg a day. I'm hopeful this will help with his systemic fluid overload. He also has stage III lymphedema. Really no change in the 2 wounds since last week 01/11/18; the patient is down 13 pounds. He put his stage III lymphedema left leg in 4K compression last week and there is less edema fluid however we still haven't been able to communicate with home health but apparently it is [facility] but the dressings have not been changed. The patient noted an odor last week. He is also had compression pumps ordered by [facility] this as a not completed the paperwork stage. 01/18/18; patient continues to lose weight. Stage III lymphedema in the left greater than right leg under for alert compression. The major wound is on the left lateral ankle area. He apparently has bilateral compression pumps being brought to his house, these were ordered by  vein and vascular Notable for the fact today he had some blisters on the right anterior leg together with some skin nodules. This is no doubt secondary to severe lymphedema. 01/25/18; the patient has obtained his compression pumps and is using them per vein and vascular instructions 3 times a day for an hour. He also saw [name] of vein and vascular. He was felt to have venous insufficiency but did not suggest any intervention also improved  edema. It was  suggested that he have compression stockings 20-30 mm on a daily basis in addition to compression pumps. The patient arrives in clinic today with a layer of unna under for layer compression. he seems to have some trouble with the degree of compression. He has open areas on the left lateral ankle area which is his major wound left upper medial calf and a [name], [name] ([id number]) superficial open area on the right anterior shin area which was blistered last week. He has skin changes on the right anterior calf which I think are no doubt secondary to lymphedema skin nodules etc. 02/01/18; the patient comes in telling us his nephrologist have to his torsemide. Unfortunately today he is put on 7 pounds by our scales. He has blisters all over the anterior and medial part of his right calf and a new open wound. He also has soupy green drainage coming out of the left lateral calf /ankle wound. 02/08/18; culture I did last week of the left lateral ankle wound grew both Pseudomonas and Morganella. He is on Keflex from Dr. Ola [name] in the hospital. I will need to review these notes.in any case Keflex is not going to cover these 2 organisms. I'm probably going to added ciprofloxacin today for 1 week. A lot of drainage that looks purulent last week. He is not complaining of pain however he has managed to put on 10 pounds in 2 weeks by our scales in this clinic. He is going to see his nephrologist tomorrow 02/15/18; he completed the ciprofloxacin I gave him last week. Notable that he is up to 379 pounds today which is up 16 pounds from 2 weeks ago. He is complaining of orthopnea but doesn't have any chest pain. 02/22/18; he continues to have weight gain. R intake nurse reports again purulent green drainage coming out of the lateral wound on the lateral left calf. He has small open area on the right anterior leg. His torsemide was increased to 2 tablets a day I believe this is 40 mg last week in  response to the call admitted to Dr. Keturah Barre office. He follows up with [name] and Dr. Delana Meyer tomorrow 03/01/18 his weight essentially stable today at 383 pounds. Drainage out of the left lateral wound on the lower left calf/ankle is a lot less. Culture last time grew Pseudomonas. I put him on cefdinir. He has been to Dr. Keturah Barre office no adjustments in his diuretics. Dr. Nicoletta Dress prescribed a wraparound stocking for the right leg.there is no open area on the right leg. He has a superficial area on the left medial calf, left posterior calf and in the large area on the left lateral however this looks better 03/15/18; weight is not up to 393 pounds. He saw his nephrologist yesterday [name] will increase the Demadex I'm hopeful this will help with the edema control. I'm using silver alginate to all his wounds. In particular the left lateral ankle looks better. Unfortunately he has new open areas on the right lateral calf that will include use of his compression stocking at least in the short to medium term. He has new wounds o3 on the right lateral calf. One of these has some size however all numerous superficial 03/22/18; his weight is stabilized a bit. Just adjustment of his diuretics by his nephrologist. There is no doubt he has some degree of systemic fluid overload on top of severe left greater than right lymphedema. 2 weeks ago tried to transition him to stockings on the right  leg however he developed re-breakdown of skin on the right leg and we had to put him back in compression last week. He also uses external compression pumps and claims to be compliant Continued concern about his depression today 03/29/18; several ongoing issues with this patient; He no longer has home health coverage apparently secondary to a lapse in insurance. He is apparently transitioning from short for long-term disability. [facility] was changing his compression wraps on Monday and Friday. He has gained 10  pounds since last week Sees Dr. Ronalee [name] tomorrow Saw his primary doctor last week about the depression. It sounds as though he declined pharmacologic management. He seems somewhat better today. We were concerned last week when he came. Somewhat better today He is using his compression pumps once a day at home, I have asked for twice a day if possible especially on the left leg Follows up with his nephrologist in mid-August. He is managing his diuretic for I think stage IV chronic renal failure Paradoxically his wounds actually look better 04/19/18; the patient has not been seen since I last saw him 3 weeks ago. He saw Dr. Delana Meyer of vascular surgery on 03/30/18 I believe he put him in a 20/30 stocking bilaterally with a wraparound extremitease stocking. He has not been putting anything specifically on the wound. More problematic than that he has not been wearing the stockings he is at home. He has been using his external compression pumps once per day according to him on a rare occasion twice On a psychosocial level the patient is now on long-term disability and is applying for COBRA therefore he is between insurances. He has not been able to follow up with [name] who is his nephrologist as a result. As noted his weight is up to 407 pounds today. He promises me he'll follow-up with [name] 05/03/18; he hasn't been here in 2 weeks now. He apparently has been wearing a compression sock on the left leg. Massive increase in edema 3 large open wounds on the left anterior leg that were probably blisters. Significant deterioration in the left lateral ankle wound that we've been doing as his most problematic wound. He still does not have his insurance issues wrapped up. His weight is well over 400 pounds. 05/10/18; arrives today with better looking edema control in the left leg. He has been using his compression pumps twice a day. We also wrapped it is left leg for there he's been using his pumps so there  is much better edema control. Most of new wounds from last week look a lot better. Even the refractory area on the lower left lateral ankle looks a lot better to me today. He follows up with [name] this morning [nephrology] 05/17/18 patient arrives today with a lot less edema in the left leg. His weight is gone down 7 pounds. He tells me he saw Dr. Helene [name], [name] ([id number]) Candiss [name] but his torsemide was not adjusted. He is using the palms 3 times a day. There is been quite an improvement in the remaining wound on the left lateral ankle Weekly visit for follow-up of bilateral lower extremity wounds related to severe lymphedema and probably some degree of systemic fluid overload from chronic renal failure stage IV. His weight is up this week to over 400 pounds. He noted increasing edema in the right leg late last week. He took his compression stocking off he did not increase the frequency of this compression pump use. He developed a  large blister on the back of the right calf. He also has a new opened blister on the left lateral calf in addition to the wound that we've been using on the distal left lower calf area and we've been using silver alginate. He tells me that he has an ultrasound which is a DVT rule out and I think a reflux study ordered by Dr. Ronalee [name] 05/31/18; weekly visit. He went to see vein and vascular this week apparently they remove the 4-layer compression on the left and put an Unna boot on him in replacement. He's got more swelling in the left leg is resolved. That being said his left lower Wound is better. He still has a fairly large wound on the right posterior calf this was not disturbed. He has a follow-up appointment with Dr. Keturah Barre nephrologist next week 06/07/2018; the patient arrives today with a history that he took both his compression wraps off 3 days ago in order to take a shower. He has bilateral severe tense blisters. A lot of weeping erythema especially on the  lateral left calf. Almost circumferential blisters on the right. Multiple areas of epithelial breakdown. He does not complain of any pain fever chills. He states he has been using his compression pump in some form of stocking that I could not really determine the type. He did not come into the clinic with anything on his legs. He tells me he has an appointment with Dr. Merita Norton his nephrologist I believe this Friday. Paradoxically his weight is actually down to 385 pounds I believe last week he was over 400 06/14/18; arrives with better looking wound surfaces today on both legs. There is no further blistering however there are still areas that aren't epithelialized on the right anterior, right lateral, left posterior and left medial. His original wound just above the left ankle laterally is still open moist. His weight was about the same today. He tells me that his nephrologist increase the torsemide from 40/20/09/1939/40 twice a day 06/21/18; both his wraps fell down to his mid calf. Has a result he has multiple blisters across the anterior right leg above with the wraps ultimately watched. He also has blisters on the posterior part of the left calf o2. His original wound on the left lateral calf is hard to see any open area. There is however a divot. Which is going to be difficult to deal with into the future until the edema in the left leg is controlled. 2 small superficial areas remain He tells [place] his weight was 386 pounds on his scale at home. According to our scale he is up 5 pounds. He is not on a fluid restriction 06/28/18; patient's weight is gone up 2 pounds since last time. He has weeping edema and open superficial wounds on the right posterior right lateral and right anterior calf. He has a small open area on the left anterior probably left posterior calf and the original wound on the left lateral calf appears to be just about closed He is being planned for a dialysis shunt in the right  arm through vein and vascular incoordination with his nephrologist [name] He claims to be using his compression pumps twice a day. He has lymphedema and no doubt systemic fluid volume overload from stage IV chronic renal failure He is Objective Constitutional Patient is hypertensive.. Pulse regular and within target range for patient.[name] Respirations regular, non-labored and within target range.. Temperature is normal and within the target range for the patient.[name] appears  in no distress. Vitals Time Taken: 12:45 PM, Height: 76 in, Weight: 385.9 lbs, BMI: 47, Temperature: 98.0 F, Pulse: 57 bpm, Respiratory Rate: 18 breaths/min, Blood Pressure: 169/79 mmHg. [name], [name] ([id number]) Eyes Conjunctivae clear. No discharge. Respiratory Respiratory effort is easy and symmetric bilaterally. Rate is normal at rest and on room air.. Cardiovascular JVP is elevated. Dorsalis pedis pulses are palpable bilaterally. Severe lymphedema of the left greater than right calf. There is pitting edema in the thighs indicative of fluid volume overload. Psychiatric Patient appears depressed today.. General Notes: Wound exam; multiple open areas on the right anterior, laterally and posteriorly. On the left anteriorly and posteriorly. He has original wound on the left distal lateral calf which seems just about closed but varied in lymphedema fluid. Integumentary (Hair, Skin) No primary skin issues are seen. Wound #2 status is Open. Original cause of wound was Gradually Appeared. The wound is located on the Left,Lateral,Posterior Lower Leg. The wound measures 1.2cm length x 5cm width x 0.7cm depth; 4.712cm^2 area and 3.299cm^3 volume. There is no tunneling or undermining noted. There is a small amount of serous drainage noted. There is no granulation within the wound bed. There is a large (67-100%) amount of necrotic tissue within the wound bed including Adherent Slough. The periwound skin appearance did not  exhibit: Callus, Crepitus, Excoriation, Induration, Rash, Scarring, Dry/Scaly, Maceration, Atrophie Blanche, Cyanosis, Ecchymosis, Hemosiderin Staining, Mottled, Pallor, Rubor, Erythema. Assessment Active Problems ICD-10 Non-pressure chronic ulcer of left calf with necrosis of muscle Lymphedema, not elsewhere classified Chronic venous hypertension (idiopathic) with ulcer and inflammation of left lower extremity Type 2 diabetes mellitus with other skin ulcer Non-pressure chronic ulcer of right calf limited to breakdown of skin Diagnoses ICD-10 L97.223: Non-pressure chronic ulcer of left calf with necrosis of muscle I89.0: Lymphedema, not elsewhere classified I87.332: Chronic venous hypertension (idiopathic) with ulcer and inflammation of left lower extremity E11.622: Type 2 diabetes mellitus with other skin ulcer L97.211: Non-pressure chronic ulcer of right calf limited to breakdown of skin Plan Wound Cleansing: [name], [name] ([id number]) Wound #2 Left,Lateral,Posterior Lower Leg: Clean wound with Normal Saline. Cleanse wound with mild soap and water Anesthetic (add to Medication List): Wound #2 Left,Lateral,Posterior Lower Leg: Topical Lidocaine 4% cream applied to wound bed prior to debridement (In Clinic Only). Primary Wound Dressing: Wound #2 Left,Lateral,Posterior Lower Leg: Silver Alginate - on all draining areas Secondary Dressing: Wound #2 Left,Lateral,Posterior Lower Leg: Other - absorbent dressing on all weeping areas Dressing Change Frequency: Wound #2 Left,Lateral,Posterior Lower Leg: Change Dressing Monday, Wednesday, Friday - Every 2 weeks on Wednesdays in [facility] Follow-up Appointments: Wound #2 Left,Lateral,Posterior Lower Leg: Return Appointment in 1 week. Nurse Visit as needed Edema Control: Wound #2 Left,Lateral,Posterior Lower Leg: 4 Layer Compression System - Bilateral Compression Pump: Use compression pump on left lower extremity for 30  minutes, twice daily. - 1 Hour three times Daily Compression Pump: Use compression pump on right lower extremity for 30 minutes, twice daily. - 1 Hour three times daily Home Health: Wound #2 Left,Lateral,Posterior Lower Leg: Continue Home Health Visits - [place] Nurse may visit PRN to address patient s wound care needs. FACE TO FACE ENCOUNTER: MEDICARE and MEDICAID PATIENTS: I certify that this patient is under my care and that I had a face-to-face encounter that meets the physician face-to-face encounter requirements with this patient on this date. The encounter with the patient was in whole or in part for the following MEDICAL CONDITION: (primary reason for [place])  MEDICAL NECESSITY: I certify, that based on my findings, NURSING services are a medically necessary home health service. HOME BOUND STATUS: I certify that my clinical findings support that this patient is homebound (i.e., Due to illness or injury, pt requires aid of supportive devices such as crutches, cane, wheelchairs, walkers, the use of special transportation or the assistance of another person to leave their place of residence. There is a normal inability to leave the home and doing so requires considerable and taxing effort. Other absences are for medical reasons / religious services and are infrequent or of short duration when for other reasons). If current dressing causes regression in wound condition, may D/C ordered dressing product/s and apply Normal Saline Moist Dressing daily until next [place] / Other MD appointment. [place] of regression in wound condition at [phone]. Please direct any NON-WOUND related issues/requests for orders to patient's Primary Care Physician #1 uncontrolled edema secondary to massive left greater than right distal lower extremity lymphedema and systemic fluid volume overload. #2 I asked him to consider trying to use his pumps 3 times a  day although I wonder if it's going to take dialysis to correct this. #3 week been trying to get him home health if we're successful we can follow him in 2 weeks otherwise we'll see him next week Electronic Signature(s) Signed: 06/28/2018 5:32:05 PM By: Linton Ham MD Entered By: Linton Ham on 06/28/2018 13:25:18 [name], [name] ([id number]) Muscato, [name] ([id number]) -------------------------------------------------------------------------------- SuperBill Details Patient Name: Clippinger, [place] A. Date of Service: 06/28/2018 Medical Record Number: [id number] Patient Account Number: [id number] Date of Birth/Sex: 02-27-1971 (47 y.o. 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 Physician Procedures CPT4 Code: [id number] Description: 75449 - WC PHYS LEVEL 3 - EST PT ICD-10 Diagnosis Description L97.223 Non-pressure chronic ulcer of left calf with necrosis of m I89.0 Lymphedema, not elsewhere classified Modifier: uscle Quantity: 1 Electronic Signature(s) Signed: 06/28/2018 5:32:05 PM By: Linton Ham MD Entered By: Linton Ham on 06/28/2018 13:26:05

## 2018-07-01 NOTE — Progress Notes (Signed)
GLENNIS, MONTENEGRO ([id number]) Visit Report for 06/28/2018 Arrival Information Details Patient Name: Scerbo, Mckinnon A. Date of Service: 06/28/2018 12:45 PM Medical Record Number: [id number] Patient Account Number: [id number] Date of Birth/Sex: [birth year] (47 y.o. M) Treating RN: Secundino Ginger Primary Care Blanch Stang: Gaetano Net Other Clinician: Referring Jaquia Benedicto: Gaetano Net Treating Alexiana Laverdure/Extender: Tito Dine in Treatment: 9 Visit Information History Since Last Visit Added or deleted any medications: No Patient Arrived: Ambulatory Any new allergies or adverse reactions: No Arrival Time: 12:44 Had a fall or experienced change in No Accompanied By: self activities of daily living that may affect Transfer Assistance: None risk of falls: Patient Identification Verified: Yes Signs or symptoms of abuse/neglect since last visito No Secondary Verification Process Yes Hospitalized since last visit: No Completed: Implantable device outside of the clinic excluding No Patient Requires Transmission-Based No cellular tissue based products placed in the center Precautions: since last visit: Patient Has Alerts: Yes Has Dressing in Place as Prescribed: No Patient Alerts: DM II Pain Present Now: No noncompressible bilateral Electronic Signature(s) Signed: 06/28/2018 3:57:54 PM By: Secundino Ginger Entered By: Secundino Ginger on 06/28/2018 12:44:59 Guthmiller, Krist A. ([id number]) -------------------------------------------------------------------------------- Encounter Discharge Information Details Patient Name: Bluemel, Eldridge A. Date of Service: 06/28/2018 12:45 PM Medical Record Number: [id number] Patient Account Number: [id number] Date of Birth/Sex: [birth year] (47 y.o. M) Treating RN: Cornell Barman Primary Care Chai Routh: Gaetano Net Other Clinician: Referring Theoplis Garciagarcia: Gaetano Net Treating Kiasia Chou/Extender: Tito Dine in Treatment: 64 Encounter Discharge Information  Items Discharge Condition: Stable Ambulatory Status: Ambulatory Discharge Destination: Home Transportation: Private Auto Accompanied By: self Schedule Follow-up Appointment: Yes Clinical Summary of Care: Electronic Signature(s) Signed: 06/29/2018 7:26:08 AM By: Gretta Cool, BSN, RN, CWS, Kim RN, BSN Entered By: Gretta Cool, BSN, RN, CWS, Kim on 06/28/2018 13:16:31 Weinert, Kamden AMarland Kitchen ([id number]) -------------------------------------------------------------------------------- Lower Extremity Assessment Details Patient Name: Pelle, Tung A. Date of Service: 06/28/2018 12:45 PM Medical Record Number: [id number] Patient Account Number: [id number] Date of Birth/Sex: 11-14-1970 (47 y.o. M) Treating RN: Secundino Ginger Primary Care Cartina Brousseau: Gaetano Net Other Clinician: Referring Amour Trigg: Gaetano Net Treating Rabon Scholle/Extender: Tito Dine in Treatment: 26 Edema Assessment Assessed: [Left: No] [Right: No] Edema: [Left: Yes] [Right: Yes] Calf Left: Right: Point of Measurement: 36 cm From Medial Instep 64 cm 53 cm Ankle Left: Right: Point of Measurement: 14 cm From Medial Instep 40 cm 40 cm Vascular Assessment Claudication: Claudication Assessment [Left:None] [Right:None] Pulses: Dorsalis Pedis Palpable: [Left:Yes] [Right:Yes] Posterior Tibial Extremity colors, hair growth, and conditions: Extremity Color: [Left:Normal] [Right:Normal] Hair Growth on Extremity: [Left:No] [Right:No] Temperature of Extremity: [Left:Warm] [Right:Warm] Capillary Refill: [Left:< 3 seconds] [Right:< 3 seconds] Toe Nail Assessment Left: Right: Thick: Yes Yes Discolored: Yes Yes Deformed: Yes Yes Improper Length and Hygiene: Yes Electronic Signature(s) Signed: 06/28/2018 3:57:54 PM By: Secundino Ginger Entered By: Secundino Ginger on 06/28/2018 12:55:45 Mercado, Shaheer A. ([id number]) -------------------------------------------------------------------------------- Multi Wound Chart Details Patient Name: Hartung, Mathhew  A. Date of Service: 06/28/2018 12:45 PM Medical Record Number: [id number] Patient Account Number: [id number] Date of Birth/Sex: 1970/11/11 (47 y.o. M) Treating RN: Cornell Barman Primary Care Gregery Walberg: Gaetano Net Other Clinician: Referring Mordche Hedglin: Gaetano Net Treating Adden Strout/Extender: Tito Dine in Treatment: 26 Vital Signs Height(in): 76 Pulse(bpm): 57 Weight(lbs): 385.9 Blood Pressure(mmHg): 169/79 Body Mass Index(BMI): 47 Temperature(F): 98.0 Respiratory Rate 18 (breaths/min): Photos: [N/A:N/A] Wound Location: Left Lower Leg - Lateral, N/A N/A Posterior Wounding Event: Gradually Appeared N/A N/A Primary Etiology: Diabetic Wound/Ulcer of the N/A N/A Lower Extremity Secondary Etiology: Lymphedema N/A N/A Comorbid  History: Hypertension, Type II N/A N/A Diabetes, Neuropathy Date Acquired: 09/30/2017 N/A N/A Weeks of Treatment: 26 N/A N/A Wound Status: Open N/A N/A Measurements L x W x D 1.2x5x0.7 N/A N/A (cm) Area (cm) : 4.712 N/A N/A Volume (cm) : 3.299 N/A N/A % Reduction in Area: 83.30% N/A N/A % Reduction in Volume: 70.80% N/A N/A Classification: Grade 2 N/A N/A Exudate Amount: Small N/A N/A Exudate Type: Serous N/A N/A Exudate Color: amber N/A N/A Granulation Amount: None Present (0%) N/A N/A Necrotic Amount: Large (67-100%) N/A N/A Exposed Structures: Fascia: No N/A N/A Fat Layer (Subcutaneous Tissue) Exposed: No Tendon: No Muscle: No Partridge, Damarion A. ([id number]) Joint: No Bone: No Periwound Skin Texture: Excoriation: No N/A N/A Induration: No Callus: No Crepitus: No Rash: No Scarring: No Periwound Skin Moisture: Maceration: No N/A N/A Dry/Scaly: No Periwound Skin Color: Atrophie Blanche: No N/A N/A Cyanosis: No Ecchymosis: No Erythema: No Hemosiderin Staining: No Mottled: No Pallor: No Rubor: No Tenderness on Palpation: No N/A N/A Wound Preparation: Ulcer Cleansing: N/A N/A Rinsed/Irrigated with Saline Topical  Anesthetic Applied: Other: lidocaine 4% Treatment Notes Wound #2 (Left, Lateral, Posterior Lower Leg) Notes Silvercell, xsorb, 4layer bilateral Electronic Signature(s) Signed: 06/28/2018 5:32:05 PM By: Linton Ham MD Entered By: Linton Ham on 06/28/2018 13:16:59 Wimberley, Lauris AMarland Kitchen ([id number]) -------------------------------------------------------------------------------- [place] Details Patient Name: Payano, Helio A. Date of Service: 06/28/2018 12:45 PM Medical Record Number: [id number] Patient Account Number: [id number] Date of Birth/Sex: Mar 10, 1971 (47 y.o. M) Treating RN: Cornell Barman Primary Care Rahman Ferrall: Gaetano Net Other Clinician: Referring Paysen Goza: Gaetano Net Treating Darryon Bastin/Extender: Tito Dine in Treatment: 51 Active Inactive ` Abuse / Safety / Falls / Self Care Management Nursing Diagnoses: Potential for falls Goals: Patient will remain injury free related to falls Date Initiated: 12/28/2017 Target Resolution Date: 04/08/2018 Goal Status: Active Interventions: Assess fall risk on admission and as needed Notes: ` Nutrition Nursing Diagnoses: Impaired glucose control: actual or potential Goals: Patient/caregiver verbalizes understanding of need to maintain therapeutic glucose control per primary care physician Date Initiated: 12/28/2017 Target Resolution Date: 04/08/2018 Goal Status: Active Interventions: Provide education on elevated blood sugars and impact on wound healing Notes: ` Orientation to the Wound Care Program Nursing Diagnoses: Knowledge deficit related to the wound healing center program Goals: Patient/caregiver will verbalize understanding of the [place] Program Date Initiated: 12/28/2017 Target Resolution Date: 04/08/2018 Goal Status: Active Interventions: Nakagawa, Lavere A. ([id number]) Provide education on orientation to the wound center Notes: ` Venous Leg Ulcer Nursing  Diagnoses: Knowledge deficit related to disease process and management Goals: Patient will maintain optimal edema control Date Initiated: 03/27/2018 Target Resolution Date: 04/27/2018 Goal Status: Active Interventions: Assess peripheral edema status every visit. Compression as ordered Treatment Activities: Therapeutic compression applied : 03/22/2018 Notes: ` Wound/Skin Impairment Nursing Diagnoses: Impaired tissue integrity Goals: Ulcer/skin breakdown will heal within 14 weeks Date Initiated: 12/28/2017 Target Resolution Date: 04/08/2018 Goal Status: Active Interventions: Assess patient/caregiver ability to obtain necessary supplies Assess patient/caregiver ability to perform ulcer/skin care regimen upon admission and as needed Assess ulceration(s) every visit Notes: Electronic Signature(s) Signed: 06/29/2018 7:26:08 AM By: Gretta Cool, BSN, RN, CWS, Kim RN, BSN Entered By: Gretta Cool, BSN, RN, CWS, Kim on 06/28/2018 13:03:27 Penalver, Ritik AMarland Kitchen ([id number]) -------------------------------------------------------------------------------- Pain Assessment Details Patient Name: Tatham, Reznor A. Date of Service: 06/28/2018 12:45 PM Medical Record Number: [id number] Patient Account Number: [id number] Date of Birth/Sex: 1971-04-10 (47 y.o. M) Treating RN: Secundino Ginger Primary Care Jerik Falletta: Gaetano Net Other Clinician: Referring Jaylen Knope: Gaetano Net  Treating Sadae Arrazola/Extender: Ricard Dillon Weeks in Treatment: 26 Active Problems Location of Pain Severity and Description of Pain Patient Has Paino No Site Locations Pain Management and Medication Current Pain Management: Goals for Pain Management pt denies any pain at this time. Electronic Signature(s) Signed: 06/28/2018 3:57:54 PM By: Secundino Ginger Entered By: Secundino Ginger on 06/28/2018 12:45:21 Bernabei, Keyan AMarland Kitchen ([id number]) -------------------------------------------------------------------------------- Patient/Caregiver Education  Details Patient Name: Castelli, Talan A. Date of Service: 06/28/2018 12:45 PM Medical Record Number: [id number] Patient Account Number: [id number] Date of Birth/Gender: April 03, 1971 (47 y.o. M) Treating RN: Cornell Barman Primary Care Physician: Gaetano Net Other Clinician: Referring Physician: Gaetano Net Treating Physician/Extender: Tito Dine in Treatment: 83 Education Assessment Education Provided To: Patient Education Topics Provided Wound/Skin Impairment: Handouts: Caring for Your Ulcer Methods: Demonstration, Explain/Verbal Responses: State content correctly Electronic Signature(s) Signed: 06/29/2018 7:26:08 AM By: Gretta Cool, BSN, RN, CWS, Kim RN, BSN Entered By: Gretta Cool, BSN, RN, CWS, Kim on 06/28/2018 13:15:57 Lukin, Dayln AMarland Kitchen ([id number]) -------------------------------------------------------------------------------- Wound Assessment Details Patient Name: Laurel, Labib A. Date of Service: 06/28/2018 12:45 PM Medical Record Number: [id number] Patient Account Number: [id number] Date of Birth/Sex: [birth year] (47 y.o. M) Treating RN: Secundino Ginger Primary Care Lekita Kerekes: Gaetano Net Other Clinician: Referring Brittley Regner: Gaetano Net Treating Kadden Osterhout/Extender: Tito Dine in Treatment: 26 Wound Status Wound Number: 2 Primary Etiology: Diabetic Wound/Ulcer of the Lower Extremity Wound Location: Left Lower Leg - Lateral, Posterior Secondary Lymphedema Wounding Event: Gradually Appeared Etiology: Date Acquired: 09/30/2017 Wound Status: Open Weeks Of Treatment: 26 Comorbid History: Hypertension, Type II Diabetes, Clustered Wound: No Neuropathy Photos Photo Uploaded By: Secundino Ginger on 06/28/2018 12:59:45 Wound Measurements Length: (cm) 1.2 Width: (cm) 5 Depth: (cm) 0.7 Area: (cm) 4.712 Volume: (cm) 3.299 % Reduction in Area: 83.3% % Reduction in Volume: 70.8% Tunneling: No Undermining: No Wound Description Classification: Grade 2 Exudate Amount:  Small Exudate Type: Serous Exudate Color: amber Foul Odor After Cleansing: No Slough/Fibrino Yes Wound Bed Granulation Amount: None Present (0%) Exposed Structure Necrotic Amount: Large (67-100%) Fascia Exposed: No Necrotic Quality: Adherent Slough Fat Layer (Subcutaneous Tissue) Exposed: No Tendon Exposed: No Muscle Exposed: No Joint Exposed: No Bone Exposed: No Periwound Skin Texture Texture Color Wagenaar, Hensley A. ([id number]) No Abnormalities Noted: No No Abnormalities Noted: No Callus: No Atrophie Blanche: No Crepitus: No Cyanosis: No Excoriation: No Ecchymosis: No Induration: No Erythema: No Rash: No Hemosiderin Staining: No Scarring: No Mottled: No Pallor: No Moisture Rubor: No No Abnormalities Noted: No Dry / Scaly: No Maceration: No Wound Preparation Ulcer Cleansing: Rinsed/Irrigated with Saline Topical Anesthetic Applied: Other: lidocaine 4%, Treatment Notes Wound #2 (Left, Lateral, Posterior Lower Leg) Notes Silvercell, xsorb, 4layer bilateral Electronic Signature(s) Signed: 06/28/2018 3:57:54 PM By: Secundino Ginger Entered By: Secundino Ginger on 06/28/2018 12:52:22 Hyde, Covey A. ([id number]) -------------------------------------------------------------------------------- Vitals Details Patient Name: Jovel, Jerrie A. Date of Service: 06/28/2018 12:45 PM Medical Record Number: [id number] Patient Account Number: [id number] Date of Birth/Sex: 11/25/1970 (47 y.o. M) Treating RN: Secundino Ginger Primary Care Hermine Feria: Gaetano Net Other Clinician: Referring Lakindra Wible: Gaetano Net Treating Deniro Laymon/Extender: Tito Dine in Treatment: 26 Vital Signs Time Taken: 12:45 Temperature (F): 98.0 Height (in): 76 Pulse (bpm): 57 Weight (lbs): 385.9 Respiratory Rate (breaths/min): 18 Body Mass Index (BMI): 47 Blood Pressure (mmHg): 169/79 Reference Range: 80 - 120 mg / dl Electronic Signature(s) Signed: 06/28/2018 3:57:54 PM By: Secundino Ginger Entered By: Secundino Ginger on 06/28/2018 12:45:51

## 2018-07-03 ENCOUNTER — Ambulatory Visit (INDEPENDENT_AMBULATORY_CARE_PROVIDER_SITE_OTHER): Payer: BLUE CROSS/BLUE SHIELD | Admitting: Vascular Surgery

## 2018-07-03 ENCOUNTER — Encounter (INDEPENDENT_AMBULATORY_CARE_PROVIDER_SITE_OTHER): Payer: Self-pay | Admitting: Vascular Surgery

## 2018-07-03 VITALS — BP 154/78 | HR 65 | Resp 17 | Wt 397.4 lb

## 2018-07-03 DIAGNOSIS — I872 Venous insufficiency (chronic) (peripheral): Secondary | ICD-10-CM

## 2018-07-03 DIAGNOSIS — N184 Chronic kidney disease, stage 4 (severe): Secondary | ICD-10-CM

## 2018-07-03 DIAGNOSIS — L97319 Non-pressure chronic ulcer of right ankle with unspecified severity: Secondary | ICD-10-CM

## 2018-07-03 DIAGNOSIS — I83013 Varicose veins of right lower extremity with ulcer of ankle: Secondary | ICD-10-CM

## 2018-07-03 DIAGNOSIS — I83009 Varicose veins of unspecified lower extremity with ulcer of unspecified site: Secondary | ICD-10-CM

## 2018-07-03 DIAGNOSIS — I83023 Varicose veins of left lower extremity with ulcer of ankle: Secondary | ICD-10-CM

## 2018-07-03 DIAGNOSIS — I129 Hypertensive chronic kidney disease with stage 1 through stage 4 chronic kidney disease, or unspecified chronic kidney disease: Secondary | ICD-10-CM

## 2018-07-03 DIAGNOSIS — I10 Essential (primary) hypertension: Secondary | ICD-10-CM

## 2018-07-03 DIAGNOSIS — L97329 Non-pressure chronic ulcer of left ankle with unspecified severity: Secondary | ICD-10-CM

## 2018-07-03 DIAGNOSIS — L97909 Non-pressure chronic ulcer of unspecified part of unspecified lower leg with unspecified severity: Principal | ICD-10-CM

## 2018-07-03 DIAGNOSIS — I890 Lymphedema, not elsewhere classified: Secondary | ICD-10-CM

## 2018-07-04 ENCOUNTER — Encounter (INDEPENDENT_AMBULATORY_CARE_PROVIDER_SITE_OTHER): Payer: Self-pay | Admitting: Vascular Surgery

## 2018-07-04 NOTE — Progress Notes (Signed)
MRN : [id number]  [name] is a 47 y.o. (08-03-1971) male who presents with chief complaint of  Chief Complaint  Patient presents with  . Follow-up    28month follow up  .  History of Present Illness:   Patient is seen for follow up evaluation of leg pain and swelling associated with venous ulceration. The patient was recently seen here and started on Unna boot therapy.  The swelling abruptly became much worse bilaterally and is associated with pain and discoloration. The pain and swelling worsens with prolonged dependency and improves with elevation.  The patient notes that in the morning the legs are better but the leg symptoms worsened throughout the course of the day. The patient has also noted a progressive worsening of the discoloration in the ankle and shin area.   The patient notes that an ulcer has developed acutely without specific trauma and since it occurred it has been very slow to heal.  There is a moderate amount of drainage associated with the open area.  The wound is also very painful.  The patient notes that they were not able to tolerate the Unna boot and removed it several days ago.  The patient states that they have been elevating as much as possible. The patient denies any recent changes in medications.  The patient denies a history of DVT or PE. There is no prior history of phlebitis. There is no history of primary lymphedema.  No SOB or increased cough.  No sputum production.  No recent episodes of CHF exacerbation.   Current Meds  Medication Sig  . allopurinol (ZYLOPRIM) 100 MG tablet Take 100 mg by mouth daily.  [name] amLODipine (NORVASC) 10 MG tablet Take by mouth.  [name] atenolol (TENORMIN) 100 MG tablet Take 1 tablet (100 mg total) by mouth daily.  . cephALEXin (KEFLEX) 500 MG capsule Take 500 mg by mouth 2 (two) times daily.  . cloNIDine (CATAPRES) 0.1 MG tablet Take 0.1 mg by mouth 2 (two) times daily.   [name] gentamicin cream (GARAMYCIN) 0.1 % Apply 1  application topically 3 (three) times daily.  . hydrALAZINE (APRESOLINE) 25 MG tablet Take 25 mg by mouth 3 (three) times daily.   . Insulin Glargine (LANTUS SOLOSTAR) 100 UNIT/ML Solostar Pen Inject 24 Units into the skin.   [name] JARDIANCE 25 MG TABS tablet TAKE 1 TABLET BY MOUTH EVERY DAY IN THE MORNING  . metFORMIN (GLUCOPHAGE-XR) 500 MG 24 hr tablet TAKE 2 TABLETS (1,000 MG TOTAL) BY MOUTH ONCE DAILY.(NEED APPT FOR FURTHER REFILLS)  . torsemide (DEMADEX) 20 MG tablet 40 mg 2 (two) times daily.     Past Medical History:  Diagnosis Date  . Diabetes mellitus without complication ([place])   . Hypertension   . Microalbuminuria   . Vitamin D deficiency     Past Surgical History:  Procedure Laterality Date  . APPLICATION OF WOUND VAC Left 10/03/2017   Procedure: APPLICATION OF WOUND VAC;  Surgeon: Algernon Huxley, MD;  Location: [facility];  Service: General;  Laterality: Left;  . APPLICATION OF WOUND VAC Left 10/11/2017   Procedure: APPLICATION OF WOUND VAC;  Surgeon: Katha Cabal, MD;  Location: [facility];  Service: Vascular;  Laterality: Left;  . APPLICATION OF WOUND VAC Left 10/14/2017   Procedure: WOUND VAC CHANGE;  Surgeon: Katha Cabal, MD;  Location: [facility];  Service: Vascular;  Laterality: Left;  left lower leg  . APPLICATION OF WOUND VAC Left 10/18/2017   Procedure: WOUND  VAC CHANGE;  Surgeon: Katha Cabal, MD;  Location: [facility];  Service: Vascular;  Laterality: Left;  . APPLICATION OF WOUND VAC Left 10/07/2017   Procedure: APPLICATION OF WOUND VAC;  Surgeon: Katha Cabal, MD;  Location: [facility];  Service: Vascular;  Laterality: Left;  . I&D EXTREMITY Left 10/11/2017   Procedure: IRRIGATION AND DEBRIDEMENT EXTREMITY;  Surgeon: Katha Cabal, MD;  Location: [facility];  Service: Vascular;  Laterality: Left;  . INCISION AND DRAINAGE ABSCESS Right 10/07/2017   Procedure: INCISION AND DRAINAGE ABSCESS;  Surgeon: Katha Cabal, MD;  Location: [facility];  Service:  Vascular;  Laterality: Right;  . IRRIGATION AND DEBRIDEMENT ABSCESS Left 10/03/2017   Procedure: IRRIGATION AND DEBRIDEMENT ABSCESS with debridement of skin, soft tissue, muscle 50sq cm;  Surgeon: Algernon Huxley, MD;  Location: [facility];  Service: General;  Laterality: Left;    Social History Social History   Tobacco Use  . Smoking status: Never Smoker  . Smokeless tobacco: Never Used  Substance Use Topics  . Alcohol use: Yes    Comment: beer  . Drug use: Not on file    Family History Family History  Problem Relation Age of Onset  . Diabetes Father   . Kidney disease Father   . Diabetes Daughter     Allergies  Allergen Reactions  . Lisinopril Swelling  . Furosemide Other (See Comments)    Light headed, cough, blurry vision, weakness.     REVIEW OF SYSTEMS (Negative unless checked)  Constitutional: [] Weight loss  [] Fever  [] Chills Cardiac: [] Chest pain   [] Chest pressure   [] Palpitations   [] Shortness of breath when laying flat   [] Shortness of breath with exertion. Vascular:  [] Pain in legs with walking   [] Pain in legs at rest  [] History of DVT   [] Phlebitis   [x] Swelling in legs   [] Varicose veins   [x] Non-healing ulcers Pulmonary:   [] Uses home oxygen   [] Productive cough   [] Hemoptysis   [] Wheeze  [] COPD   [] Asthma Neurologic:  [] Dizziness   [] Seizures   [] History of stroke   [] History of TIA  [] Aphasia   [] Vissual changes   [] Weakness or numbness in arm   [] Weakness or numbness in leg Musculoskeletal:   [] Joint swelling   [x] Joint pain   [] Low back pain Hematologic:  [] Easy bruising  [] Easy bleeding   [] Hypercoagulable state   [] Anemic Gastrointestinal:  [] Diarrhea   [] Vomiting  [] Gastroesophageal reflux/heartburn   [] Difficulty swallowing. Genitourinary:  [x] Chronic kidney disease   [] Difficult urination  [] Frequent urination   [] Blood in urine Skin:  [x] Rashes   [x] Ulcers  Psychological:  [] History of anxiety   []  History of major depression.  Physical  Examination  Vitals:   07/03/18 1457  BP: (!) 154/78  Pulse: 65  Resp: 17  Weight: (!) 397 lb 6.4 oz (180.3 kg)   Body mass index is 48.37 kg/m. Gen: WD/WN, NAD Head: [place]/AT, No temporalis wasting.  Ear/Nose/Throat: Hearing grossly intact, nares w/o erythema or drainage Eyes: PER, EOMI, sclera nonicteric.  Neck: Supple, no large masses.   Pulmonary:  Good air movement, no audible wheezing bilaterally, no use of accessory muscles.  Cardiac: RRR, no JVD Vascular: 4+ edema bilaterally with severe venous changes bilaterally.  Venous ulcers noted in the ankle area bilaterally, noninfected Vessel Right Left  Radial Palpable Palpable  PT Not Palpable Not Palpable  DP Not Palpable Not Palpable  Gastrointestinal: Non-distended. No guarding/no peritoneal signs.  Musculoskeletal: M/S 5/5 throughout.  No deformity  or atrophy.  Neurologic: CN 2-12 intact. Symmetrical.  Speech is fluent. Motor exam as listed above. Psychiatric: Judgment intact, Mood & affect appropriate for pt's clinical situation. Dermatologic: Venous stasis dermatitis with ulcers present.  No changes consistent with cellulitis. Lymph : No lichenification or skin changes of chronic lymphedema.  CBC Lab Results  Component Value Date   WBC 5.9 10/19/2017   HGB 8.4 (L) 10/19/2017   HCT 25.6 (L) 10/19/2017   MCV 89.1 10/19/2017   PLT 320 10/19/2017    BMET    Component Value Date/Time   NA 139 10/19/2017 0311   K 4.2 10/19/2017 0311   CL 107 10/19/2017 0311   CO2 24 10/19/2017 0311   GLUCOSE 109 (H) 10/19/2017 0311   BUN 37 (H) 10/19/2017 0311   CREATININE 2.11 (H) 10/19/2017 0311   CALCIUM 8.6 (L) 10/19/2017 0311   GFRNONAA 35 (L) 10/19/2017 0311   GFRAA 41 (L) 10/19/2017 0311   CrCl cannot be calculated (Patient's most recent lab result is older than the maximum 21 days allowed.).  COAG Lab Results  Component Value Date   INR 1.05 10/14/2017   INR 1.16 10/06/2017    Radiology No results  found.   Assessment/Plan 1. Venous ulcer ([place]) No surgery or intervention at this point in time.    I have had a long discussion with the patient regarding venous insufficiency and why it  causes symptoms, specifically venous ulceration . I have discussed with the patient the chronic skin changes that accompany venous insufficiency and the long term sequela such as infection and recurring  ulceration.  Patient will be placed in [place] bilaterally which will be changed weekly drainage permitting.  In addition, behavioral modification including several periods of elevation of the lower extremities during the day will be continued. Achieving a position with the ankles at heart level was stressed to the patient  The patient is instructed to begin routine exercise, especially walking on a daily basis  The patient can be assessed for graduated compression stockings or wraps as well as a Lymph Pump once the ulcers are healed.   2. Chronic renal insufficiency, stage IV (severe) (HCC) Recommend:  At this time the patient does not have appropriate extremity access for dialysis  Patient should have a right arm WavlinQ created.  The risks, benefits and alternative therapies were reviewed in detail with the patient.  All questions were answered.  The patient agrees to proceed with surgery.    3. Chronic venous insufficiency See #1  4. Lymphedema See #1  5. Essential hypertension Continue antihypertensive medications as already ordered, these medications have been reviewed and there are no changes at this time.     [name], MD  07/04/2018 8:57 PM

## 2018-07-05 ENCOUNTER — Encounter: Payer: BLUE CROSS/BLUE SHIELD | Attending: Internal Medicine | Admitting: Internal Medicine

## 2018-07-05 DIAGNOSIS — L97211 Non-pressure chronic ulcer of right calf limited to breakdown of skin: Secondary | ICD-10-CM | POA: Diagnosis not present

## 2018-07-05 DIAGNOSIS — Z794 Long term (current) use of insulin: Secondary | ICD-10-CM | POA: Diagnosis not present

## 2018-07-05 DIAGNOSIS — Z6841 Body Mass Index (BMI) 40.0 and over, adult: Secondary | ICD-10-CM | POA: Diagnosis not present

## 2018-07-05 DIAGNOSIS — I890 Lymphedema, not elsewhere classified: Secondary | ICD-10-CM | POA: Insufficient documentation

## 2018-07-05 DIAGNOSIS — Z79899 Other long term (current) drug therapy: Secondary | ICD-10-CM | POA: Diagnosis not present

## 2018-07-05 DIAGNOSIS — L97223 Non-pressure chronic ulcer of left calf with necrosis of muscle: Secondary | ICD-10-CM | POA: Diagnosis not present

## 2018-07-05 DIAGNOSIS — F329 Major depressive disorder, single episode, unspecified: Secondary | ICD-10-CM | POA: Diagnosis not present

## 2018-07-05 DIAGNOSIS — I87332 Chronic venous hypertension (idiopathic) with ulcer and inflammation of left lower extremity: Secondary | ICD-10-CM | POA: Diagnosis not present

## 2018-07-05 DIAGNOSIS — I129 Hypertensive chronic kidney disease with stage 1 through stage 4 chronic kidney disease, or unspecified chronic kidney disease: Secondary | ICD-10-CM | POA: Diagnosis not present

## 2018-07-05 DIAGNOSIS — N184 Chronic kidney disease, stage 4 (severe): Secondary | ICD-10-CM | POA: Diagnosis not present

## 2018-07-05 DIAGNOSIS — Z992 Dependence on renal dialysis: Secondary | ICD-10-CM | POA: Diagnosis not present

## 2018-07-05 DIAGNOSIS — E11622 Type 2 diabetes mellitus with other skin ulcer: Secondary | ICD-10-CM | POA: Diagnosis present

## 2018-07-05 DIAGNOSIS — E1122 Type 2 diabetes mellitus with diabetic chronic kidney disease: Secondary | ICD-10-CM | POA: Diagnosis not present

## 2018-07-07 ENCOUNTER — Encounter (INDEPENDENT_AMBULATORY_CARE_PROVIDER_SITE_OTHER): Payer: Self-pay

## 2018-07-08 NOTE — Progress Notes (Signed)
[name] ([id number]) Visit Report for 07/05/2018 HPI Details Patient Name: [name], [name] Date of Service: 07/05/2018 12:45 PM Medical Record Number: [id number] Patient Account Number: [id number] Date of Birth/Sex: 12-13-1970 (47 y.o. M) Treating RN: [name] Primary Care Provider: Gaetano [name] Other Clinician: Referring Provider: Gaetano [name] Treating Provider/Extender: [name] in Treatment: 48 History of Present Illness HPI Description: 12/28/17; this is a now 47 year old man who is a type II diabetic. He was hospitalized from 10/01/17 through 10/19/17. He had an MSSA soft tissue and skin infection. 2 open areas on the left leg were identified he has a smaller area on the left medial calf superiorly just below the knee and a wound just above the left ankle on the posterior medial aspect. I think both of these were surgical IandD sites when he was in the hospital. He was discharged with a wound VAC at that point however this is since been taken off. He follows with Dr. Ola [name] for the [facility] and he is still on chronic Keflex at 500 twice a day. At that time he was hospitalized his hemoglobin A1c was 15.1 however if I'm reading his endocrinologist notes correctly that is improved. He has been following with Dr. Ronalee [name] at vein and vascular and he has been applying calcium alginate and Unna boots. He has home health changing the dressing. They have also been attempting to get him external compression pumps although the patient is unaware whether they've been approved by insurance at this point. as mentioned he has a smaller clean wound on the right lateral calf just below the knee and he has a much larger area just above the left ankle medially and posteriorly. Our intake nurse reported greenish purulent looking drainage.the patient did have surgical material sent to pathology in February. This showed chronic abscess The patient also has lymphedema stage III in the left  greater than right lower extremities. He has a history of blisters with wounds but these of all were always healed. The patient thinks that the lymphedema may have been present since he was about 47 years old i.e. about 30 years. He does not have graded pressure stockings and has not worn stockings. He does not have a distant history of DVT PE or phlebitis. He has not been systemically unwell fever no chills. He states that his Lasix is recently been reduced. He tells me his kidney function is at "30%" and he has been followed by [name] of nephrology. At one point he was on Lasix 80 twice a day however that's been cut back and he is now on Lasix at 20 twice a day. The patient has a history of PAD listed in his records although he comes from Dr. Ronalee [name] I don't think is felt to have significant PAD. ABIs in our clinic were noncompressible bilaterally. 01/04/18; patient has a large wound on the left lateral lower calf and a small wound on the left medial upper calf. He has been to see his nephrologist who changed him to Demadex 40 mg a day. I'm hopeful this will help with his systemic fluid overload. He also has stage III lymphedema. Really no change in the 2 wounds since last week 01/11/18; the patient is down 13 pounds. He put his stage III lymphedema left leg in 4K compression last week and there is less edema fluid however we still haven't been able to communicate with home health but apparently it is [facility] but the dressings have not been changed. The  patient noted an odor last week. He is also had compression pumps ordered by [facility] this as a not completed the paperwork stage. 01/18/18; patient continues to lose weight. Stage III lymphedema in the left greater than right leg under for alert compression. The major wound is on the left lateral ankle area. He apparently has bilateral compression pumps being brought to his house, these were ordered by [facility] Notable for the fact today he had some blisters on the right anterior leg together with some skin nodules. This is no doubt secondary to severe lymphedema. 01/25/18; the patient has obtained his compression pumps and is using them per vein and vascular instructions 3 times a day for an hour. He also saw [name] of vein and vascular. He was felt to have venous insufficiency but did not suggest any intervention also improved edema. It was suggested that he have compression stockings 20-30 mm on a daily basis in addition to compression pumps. [name], [name] ([id number]) The patient arrives in clinic today with a layer of unna under for layer compression. he seems to have some trouble with the degree of compression. He has open areas on the left lateral ankle area which is his major wound left upper medial calf and a superficial open area on the right anterior shin area which was blistered last week. He has skin changes on the right anterior calf which I think are no doubt secondary to lymphedema skin nodules etc. 02/01/18; the patient comes in telling us his nephrologist have to his torsemide. Unfortunately today he is put on 7 pounds by our scales. He has blisters all over the anterior and medial part of his right calf and a new open wound. He also has soupy green drainage coming out of the left lateral calf /ankle wound. 02/08/18; culture I did last week of the left lateral ankle wound grew both Pseudomonas and Morganella. He is on Keflex from Dr. Ola [name] in the hospital. I will need to review these notes.in any case Keflex is not going to cover these 2 organisms. I'm probably going to added ciprofloxacin today for 1 week. A lot of drainage that looks purulent last week. He is not complaining of pain however he has managed to put on 10 pounds in 2 weeks by our scales in this clinic. He is going to see his nephrologist tomorrow 02/15/18; he completed the ciprofloxacin I gave him last week.  Notable that he is up to 379 pounds today which is up 16 pounds from 2 weeks ago. He is complaining of orthopnea but doesn't have any chest pain. 02/22/18; he continues to have weight gain. R intake nurse reports again purulent green drainage coming out of the lateral wound on the lateral left calf. He has small open area on the right anterior leg. His torsemide was increased to 2 tablets a day I believe this is 40 mg last week in response to the call admitted to Dr. Keturah Barre office. He follows up with [name] and Dr. Delana Meyer tomorrow 03/01/18 his weight essentially stable today at 383 pounds. Drainage out of the left lateral wound on the lower left calf/ankle is a lot less. Culture last time grew Pseudomonas. I put him on cefdinir. He has been to Dr. Keturah Barre office no adjustments in his diuretics. Dr. Nicoletta Dress prescribed a wraparound stocking for the right leg.there is no open area on the right leg. He has a superficial area on the left medial calf, left posterior  calf and in the large area on the left lateral however this looks better 03/15/18; weight is not up to 393 pounds. He saw his nephrologist yesterday [name] will increase the Demadex I'm hopeful this will help with the edema control. I'm using silver alginate to all his wounds. In particular the left lateral ankle looks better. oUnfortunately he has new open areas on the right lateral calf that will include use of his compression stocking at least in the short to medium term. He has new wounds o3 on the right lateral calf. One of these has some size however all numerous superficial 03/22/18; his weight is stabilized a bit. Just adjustment of his diuretics by his nephrologist. There is no doubt he has some degree of systemic fluid overload on top of severe left greater than right lymphedema. 2 weeks ago tried to transition him to stockings on the right leg however he developed re-breakdown of skin on the right leg and we had to put him back  in compression last week. He also uses external compression pumps and claims to be compliant Continued concern about his depression today 03/29/18; several ongoing issues with this patient; oHe no longer has home health coverage apparently secondary to a lapse in insurance. He is apparently transitioning from short for long-term disability. [facility] was changing his compression wraps on Monday and Friday. oHe has gained 10 pounds since last week oSees Dr. Ronalee [name] tomorrow oSaw his primary doctor last week about the depression. It sounds as though he declined pharmacologic management. He seems somewhat better today. We were concerned last week when he came. Somewhat better today oHe is using his compression pumps once a day at home, I have asked for twice a day if possible especially on the left leg oFollows up with his nephrologist in mid-August. He is managing his diuretic for I think stage IV chronic renal failure oParadoxically his wounds actually look better 04/19/18; the patient has not been seen since I last saw him 3 weeks ago. He saw Dr. Delana Meyer of vascular surgery on 03/30/18 I believe he put him in a 20/30 stocking bilaterally with a wraparound extremitease stocking. He has not been putting anything specifically on the wound. More problematic than that he has not been wearing the stockings he is at home. He has been using his external compression pumps once per day according to him on a rare occasion twice On a psychosocial level the patient is now on long-term disability and is applying for COBRA therefore he is between insurances. He has not been able to follow up with [name] who is his nephrologist as a result. As noted his weight is up to 407 pounds today. He promises me he'll follow-up with [name] 05/03/18; he hasn't been here in 2 weeks now. He apparently has been wearing a compression sock on the left leg. Massive increase in edema 3 large open wounds on the left anterior  leg that were probably blisters. Significant deterioration in the left lateral ankle wound that we've been doing as his most problematic wound. He still does not have his insurance issues wrapped up. His weight is well over 400 pounds. 05/10/18; arrives today with better looking edema control in the left leg. He has been using his compression pumps twice a day. We also wrapped it is left leg for there he's been using his pumps so there is much better edema control. Most of new wounds from last week look a lot better. Even the  refractory area on the lower left lateral ankle looks a lot better to me today. [name], [name] ([id number]) He follows up with [name] this morning [nephrology] 05/17/18 patient arrives today with a lot less edema in the left leg. His weight is gone down 7 pounds. He tells me he saw [name] but his torsemide was not adjusted. He is using the palms 3 times a day. There is been quite an improvement in the remaining wound on the left lateral ankle oWeekly visit for follow-up of bilateral lower extremity wounds related to severe lymphedema and probably some degree of systemic fluid overload from chronic renal failure stage IV. His weight is up this week to over 400 pounds. He noted increasing edema in the right leg late last week. He took his compression stocking off he did not increase the frequency of this compression pump use. He developed a large blister on the back of the right calf. He also has a new opened blister on the left lateral calf in addition to the wound that we've been using on the distal left lower calf area and we've been using silver alginate. He tells me that he has an ultrasound which is a DVT rule out and I think a reflux study ordered by Dr. Ronalee [name] 05/31/18; weekly visit. He went to see vein and vascular this week apparently they remove the 4-layer compression on the left and put an Unna boot on him in replacement. He's got more swelling in the left leg  is resolved. That being said his left lower Wound is better. He still has a fairly large wound on the right posterior calf this was not disturbed. He has a follow-up appointment with Dr. Keturah Barre nephrologist next week 06/07/2018; the patient arrives today with a history that he took both his compression wraps off 3 days ago in order to take a shower. He has bilateral severe tense blisters. A lot of weeping erythema especially on the lateral left calf. Almost circumferential blisters on the right. Multiple areas of epithelial breakdown. He does not complain of any pain fever chills. He states he has been using his compression pump in some form of stocking that I could not really determine the type. He did not come into the clinic with anything on his legs. He tells me he has an appointment with Dr. Merita Norton his nephrologist I believe this Friday. Paradoxically his weight is actually down to 385 pounds I believe last week he was over 400 06/14/18; arrives with better looking wound surfaces today on both legs. There is no further blistering however there are still areas that aren't epithelialized on the right anterior, right lateral, left posterior and left medial. His original wound just above the left ankle laterally is still open moist. His weight was about the same today. He tells me that his nephrologist increase the torsemide from 40/20/09/1939/40 twice a day 06/21/18; both his wraps fell down to his mid calf. Has a result he has multiple blisters across the anterior right leg above with the wraps ultimately watched. He also has blisters on the posterior part of the left calf o2. His original wound on the left lateral calf is hard to see any open area. There is however a divot. Which is going to be difficult to deal with into the future until the edema in the left leg is controlled. 2 small superficial areas remain He tells [place] his weight was 386 pounds on his scale at home. According to our scale he  is up 5 pounds. He is not on a fluid restriction 06/28/18; patient's weight is gone up 2 pounds since last time. He has weeping edema and open superficial wounds on the right posterior right lateral and right anterior calf. He has a small open area on the left anterior probably left posterior calf and the original wound on the left lateral calf appears to be just about closed He is being planned for a dialysis shunt in the right arm through [facility] incoordination with his nephrologist [name] He claims to be using his compression pumps twice a day. He has lymphedema and no doubt systemic fluid volume overload from stage IV chronic renal failure 07/04/18; patient's weight is up into the 396 range. He is supposed to see his cardiologist tomorrow and plans are being made for a shunt by Dr. Delana Meyer. He still has significant open wounds/draining areas on the right anterior and right posterior calf. Several small open areas on the left which are less in terms of wound area on the right which is surprising given the fact the left is the larger most lymphedematous leg Electronic Signature(s) Signed: 07/05/2018 4:33:49 PM By: Linton Ham MD Entered By: Linton Ham on 07/05/2018 13:29:30 [name], [name] ([id number]) -------------------------------------------------------------------------------- Physical Exam Details Patient Name: [name], [name] Date of Service: 07/05/2018 12:45 PM Medical Record Number: [id number] Patient Account Number: [id number] Date of Birth/Sex: 1971-07-07 (47 y.o. M) Treating RN: [name] Primary Care Provider: Gaetano [name] Other Clinician: Referring Provider: Gaetano [name] Treating Provider/Extender: [name] in Treatment: 67 Constitutional Patient is hypertensive.. Pulse regular and within target range for patient.[name] Respirations regular, non-labored and within target range.. Temperature is normal and within the target range for the  patient.[name] appears in no distress. Respiratory Respiratory effort is easy and symmetric bilaterally. Rate is normal at rest and on room air.. Cardiovascular Pedal pulses palpable and strong bilaterally.. Dear chronic lymphedema left greater than right with some degree of venous inflammation. Psychiatric Patient appears depressed today.. Notes Wound exam; right anterior and right posterior legs are still open. Left anteriorly there is a small open area. A small open area above the area on the left lateral calf. His original large wound on the left lateral calf just above the lateral malleolus is difficult to see whether there is an open area here not. Electronic Signature(s) Signed: 07/05/2018 4:33:49 PM By: Linton Ham MD Entered By: Linton Ham on 07/05/2018 13:35:26 [name], [name] AMarland Kitchen ([id number]) -------------------------------------------------------------------------------- Physician Orders Details Patient Name: [name], [name] Date of Service: 07/05/2018 12:45 PM Medical Record Number: [id number] Patient Account Number: [id number] Date of Birth/Sex: 1971/06/10 (47 y.o. M) Treating RN: Montey Hora Primary Care Provider: Gaetano [name] Other Clinician: Referring Provider: Gaetano [name] Treating Provider/Extender: [name] in Treatment: 53 Verbal / Phone Orders: No Diagnosis Coding ICD-10 Coding Code Description [phone] Non-pressure chronic ulcer of left calf with necrosis of muscle I89.0 Lymphedema, not elsewhere classified I87.332 Chronic venous hypertension (idiopathic) with ulcer and inflammation of left lower extremity E11.622 Type 2 diabetes mellitus with other skin ulcer L97.211 Non-pressure chronic ulcer of right calf limited to breakdown of skin Wound Cleansing Wound #2 Left,Lateral,Posterior Lower Leg o Clean wound with Normal Saline. o Cleanse wound with mild soap and water Anesthetic (add to Medication List) Wound #2 Left,Lateral,Posterior  Lower Leg o Topical Lidocaine 4% cream applied to wound bed prior to debridement (In Clinic Only). Primary Wound Dressing Wound #2 Left,Lateral,Posterior Lower Leg o Silver Alginate - on all  draining areas Secondary Dressing Wound #2 Left,Lateral,Posterior Lower Leg o Other - absorbent dressing on all weeping areas Dressing Change Frequency Wound #2 Left,Lateral,Posterior Lower Leg o Change Dressing Monday, Wednesday, Friday - Every 2 weeks on Wednesdays in [facility] Follow-up Appointments Wound #2 Left,Lateral,Posterior Lower Leg o Return Appointment in 2 weeks. o Nurse Visit as needed Edema Control Wound #2 Left,Lateral,Posterior Lower Leg o 4 Layer Compression System - Bilateral o Compression Pump: Use compression pump on left lower extremity for 30 minutes, twice daily. - 1 Hour three times Daily [name], [name] ([id number]) o Compression Pump: Use compression pump on right lower extremity for 30 minutes, twice daily. - 1 Hour three times daily Home Health Wound #2 [place] Visits - [facility]: Monday, Wednesday and Friday. Patient comes to [place] every other week on Wednesday. o Home Health Nurse may visit PRN to address patientos wound care needs. o FACE TO FACE ENCOUNTER: MEDICARE and MEDICAID PATIENTS: I certify that this patient is under my care and that I had a face-to-face encounter that meets the physician face-to-face encounter requirements with this patient on this date. The encounter with the patient was in whole or in part for the following MEDICAL CONDITION: (primary reason for [place]) MEDICAL NECESSITY: I certify, that based on my findings, NURSING services are a medically necessary home health service. HOME BOUND STATUS: I certify that my clinical findings support that this patient is homebound (i.e., Due to illness or injury, pt requires aid of supportive devices such  as crutches, cane, wheelchairs, walkers, the use of special transportation or the assistance of another person to leave their place of residence. There is a normal inability to leave the home and doing so requires considerable and taxing effort. Other absences are for medical reasons / religious services and are infrequent or of short duration when for other reasons). o If current dressing causes regression in wound condition, may D/C ordered dressing product/s and apply Normal Saline Moist Dressing daily until next [place] / Other MD appointment. [place] of regression in wound condition at [phone]. o Please direct any NON-WOUND related issues/requests for orders to patient's Primary Care Physician Electronic Signature(s) Signed: 07/05/2018 4:33:49 PM By: Linton Ham MD Signed: 07/06/2018 5:01:53 PM By: Montey Hora Entered By: Montey Hora on 07/05/2018 13:43:14 Tomes, Lovie A. ([id number]) -------------------------------------------------------------------------------- Problem List Details Patient Name: [name], [name] Date of Service: 07/05/2018 12:45 PM Medical Record Number: [id number] Patient Account Number: [id number] Date of Birth/Sex: [birth year] (47 y.o. M) Treating RN: [name] Primary Care Provider: Gaetano [name] Other Clinician: Referring Provider: Gaetano [name] Treating Provider/Extender: [name] in Treatment: 27 Active Problems ICD-10 Evaluated Encounter Code Description Active Date Today Diagnosis L97.223 Non-pressure chronic ulcer of left calf with necrosis of muscle 12/28/2017 No Yes I89.0 Lymphedema, not elsewhere classified 12/28/2017 No Yes I87.332 Chronic venous hypertension (idiopathic) with ulcer and 12/28/2017 No Yes inflammation of left lower extremity E11.622 Type 2 diabetes mellitus with other skin ulcer 12/28/2017 No Yes L97.211 Non-pressure chronic ulcer of right calf limited to breakdown 02/22/2018 No  Yes of skin Inactive Problems Resolved Problems Electronic Signature(s) Signed: 07/05/2018 4:33:49 PM By: Linton Ham MD Entered By: Linton Ham on 07/05/2018 13:26:25 [name], [name] ([id number]) -------------------------------------------------------------------------------- Progress Note Details Patient Name: [name], [name] Date of Service: 07/05/2018 12:45 PM Medical Record Number: [id number] Patient Account Number: [id number] Date of Birth/Sex: Dec 10, 1970 (47 y.o. M) Treating RN: [name]  Primary Care Provider: Gaetano [name] Other Clinician: Referring Provider: Gaetano [name] Treating Provider/Extender: [name] in Treatment: 27 Subjective History of Present Illness (HPI) 12/28/17; this is a now 47 year old man who is a type II diabetic. He was hospitalized from 10/01/17 through 10/19/17. He had an MSSA soft tissue and skin infection. 2 open areas on the left leg were identified he has a smaller area on the left medial calf superiorly just below the knee and a wound just above the left ankle on the posterior medial aspect. I think both of these were surgical IandD sites when he was in the hospital. He was discharged with a wound VAC at that point however this is since been taken off. He follows with Dr. Ola [name] for the [facility] and he is still on chronic Keflex at 500 twice a day. At that time he was hospitalized his hemoglobin A1c was 15.1 however if I'm reading his endocrinologist notes correctly that is improved. He has been following with Dr. Ronalee [name] at vein and vascular and he has been applying calcium alginate and Unna boots. He has home health changing the dressing. They have also been attempting to get him external compression pumps although the patient is unaware whether they've been approved by insurance at this point. as mentioned he has a smaller clean wound on the right lateral calf just below the knee and he has a much larger area just above the left  ankle medially and posteriorly. Our intake nurse reported greenish purulent looking drainage.the patient did have surgical material sent to pathology in February. This showed chronic abscess The patient also has lymphedema stage III in the left greater than right lower extremities. He has a history of blisters with wounds but these of all were always healed. The patient thinks that the lymphedema may have been present since he was about 47 years old i.e. about 30 years. He does not have graded pressure stockings and has not worn stockings. He does not have a distant history of DVT PE or phlebitis. He has not been systemically unwell fever no chills. He states that his Lasix is recently been reduced. He tells me his kidney function is at "30%" and he has been followed by [name] of nephrology. At one point he was on Lasix 80 twice a day however that's been cut back and he is now on Lasix at 20 twice a day. The patient has a history of PAD listed in his records although he comes from Dr. Ronalee [name] I don't think is felt to have significant PAD. ABIs in our clinic were noncompressible bilaterally. 01/04/18; patient has a large wound on the left lateral lower calf and a small wound on the left medial upper calf. He has been to see his nephrologist who changed him to Demadex 40 mg a day. I'm hopeful this will help with his systemic fluid overload. He also has stage III lymphedema. Really no change in the 2 wounds since last week 01/11/18; the patient is down 13 pounds. He put his stage III lymphedema left leg in 4K compression last week and there is less edema fluid however we still haven't been able to communicate with home health but apparently it is [facility] but the dressings have not been changed. The patient noted an odor last week. He is also had compression pumps ordered by [facility] this as a not completed the paperwork stage. 01/18/18; patient continues to lose weight. Stage III  lymphedema in the left greater  than right leg under for alert compression. The major wound is on the left lateral ankle area. He apparently has bilateral compression pumps being brought to his house, these were ordered by [facility] Notable for the fact today he had some blisters on the right anterior leg together with some skin nodules. This is no doubt secondary to severe lymphedema. 01/25/18; the patient has obtained his compression pumps and is using them per vein and vascular instructions 3 times a day for an hour. He also saw [name] of vein and vascular. He was felt to have venous insufficiency but did not suggest any intervention also improved edema. It was suggested that he have compression stockings 20-30 mm on a daily basis in addition to compression pumps. The patient arrives in clinic today with a layer of unna under for layer compression. he seems to have some trouble with the degree of compression. He has open areas on the left lateral ankle area which is his major wound left upper medial calf and a [name], [name] ([id number]) superficial open area on the right anterior shin area which was blistered last week. He has skin changes on the right anterior calf which I think are no doubt secondary to lymphedema skin nodules etc. 02/01/18; the patient comes in telling us his nephrologist have to his torsemide. Unfortunately today he is put on 7 pounds by our scales. He has blisters all over the anterior and medial part of his right calf and a new open wound. He also has soupy green drainage coming out of the left lateral calf /ankle wound. 02/08/18; culture I did last week of the left lateral ankle wound grew both Pseudomonas and Morganella. He is on Keflex from Dr. Ola [name] in the hospital. I will need to review these notes.in any case Keflex is not going to cover these 2 organisms. I'm probably going to added ciprofloxacin today for 1 week. A lot of drainage that looks  purulent last week. He is not complaining of pain however he has managed to put on 10 pounds in 2 weeks by our scales in this clinic. He is going to see his nephrologist tomorrow 02/15/18; he completed the ciprofloxacin I gave him last week. Notable that he is up to 379 pounds today which is up 16 pounds from 2 weeks ago. He is complaining of orthopnea but doesn't have any chest pain. 02/22/18; he continues to have weight gain. R intake nurse reports again purulent green drainage coming out of the lateral wound on the lateral left calf. He has small open area on the right anterior leg. His torsemide was increased to 2 tablets a day I believe this is 40 mg last week in response to the call admitted to Dr. Keturah Barre office. He follows up with [name] and Dr. Delana Meyer tomorrow 03/01/18 his weight essentially stable today at 383 pounds. Drainage out of the left lateral wound on the lower left calf/ankle is a lot less. Culture last time grew Pseudomonas. I put him on cefdinir. He has been to Dr. Keturah Barre office no adjustments in his diuretics. Dr. Nicoletta Dress prescribed a wraparound stocking for the right leg.there is no open area on the right leg. He has a superficial area on the left medial calf, left posterior calf and in the large area on the left lateral however this looks better 03/15/18; weight is not up to 393 pounds. He saw his nephrologist yesterday [name] will increase the Demadex I'm hopeful this will help with the  edema control. I'm using silver alginate to all his wounds. In particular the left lateral ankle looks better. Unfortunately he has new open areas on the right lateral calf that will include use of his compression stocking at least in the short to medium term. He has new wounds o3 on the right lateral calf. One of these has some size however all numerous superficial 03/22/18; his weight is stabilized a bit. Just adjustment of his diuretics by his nephrologist. There is no doubt he has  some degree of systemic fluid overload on top of severe left greater than right lymphedema. 2 weeks ago tried to transition him to stockings on the right leg however he developed re-breakdown of skin on the right leg and we had to put him back in compression last week. He also uses external compression pumps and claims to be compliant Continued concern about his depression today 03/29/18; several ongoing issues with this patient; He no longer has home health coverage apparently secondary to a lapse in insurance. He is apparently transitioning from short for long-term disability. [facility] was changing his compression wraps on Monday and Friday. He has gained 10 pounds since last week Sees Dr. Ronalee [name] tomorrow Saw his primary doctor last week about the depression. It sounds as though he declined pharmacologic management. He seems somewhat better today. We were concerned last week when he came. Somewhat better today He is using his compression pumps once a day at home, I have asked for twice a day if possible especially on the left leg Follows up with his nephrologist in mid-August. He is managing his diuretic for I think stage IV chronic renal failure Paradoxically his wounds actually look better 04/19/18; the patient has not been seen since I last saw him 3 weeks ago. He saw Dr. Delana Meyer of vascular surgery on 03/30/18 I believe he put him in a 20/30 stocking bilaterally with a wraparound extremitease stocking. He has not been putting anything specifically on the wound. More problematic than that he has not been wearing the stockings he is at home. He has been using his external compression pumps once per day according to him on a rare occasion twice On a psychosocial level the patient is now on long-term disability and is applying for COBRA therefore he is between insurances. He has not been able to follow up with [name] who is his nephrologist as a result. As noted his weight is up to 407  pounds today. He promises me he'll follow-up with [name] 05/03/18; he hasn't been here in 2 weeks now. He apparently has been wearing a compression sock on the left leg. Massive increase in edema 3 large open wounds on the left anterior leg that were probably blisters. Significant deterioration in the left lateral ankle wound that we've been doing as his most problematic wound. He still does not have his insurance issues wrapped up. His weight is well over 400 pounds. 05/10/18; arrives today with better looking edema control in the left leg. He has been using his compression pumps twice a day. We also wrapped it is left leg for there he's been using his pumps so there is much better edema control. Most of new wounds from last week look a lot better. Even the refractory area on the lower left lateral ankle looks a lot better to me today. He follows up with [name] this morning [nephrology] 05/17/18 patient arrives today with a lot less edema in the left leg. His weight is  gone down 7 pounds. He tells me he saw Dr. Helene Kelp, Terrance A. ([id number]) Candiss [name] but his torsemide was not adjusted. He is using the palms 3 times a day. There is been quite an improvement in the remaining wound on the left lateral ankle Weekly visit for follow-up of bilateral lower extremity wounds related to severe lymphedema and probably some degree of systemic fluid overload from chronic renal failure stage IV. His weight is up this week to over 400 pounds. He noted increasing edema in the right leg late last week. He took his compression stocking off he did not increase the frequency of this compression pump use. He developed a large blister on the back of the right calf. He also has a new opened blister on the left lateral calf in addition to the wound that we've been using on the distal left lower calf area and we've been using silver alginate. He tells me that he has an ultrasound which is a DVT rule out and I think a reflux  study ordered by Dr. Ronalee [name] 05/31/18; weekly visit. He went to see vein and vascular this week apparently they remove the 4-layer compression on the left and put an Unna boot on him in replacement. He's got more swelling in the left leg is resolved. That being said his left lower Wound is better. He still has a fairly large wound on the right posterior calf this was not disturbed. He has a follow-up appointment with Dr. Keturah Barre nephrologist next week 06/07/2018; the patient arrives today with a history that he took both his compression wraps off 3 days ago in order to take a shower. He has bilateral severe tense blisters. A lot of weeping erythema especially on the lateral left calf. Almost circumferential blisters on the right. Multiple areas of epithelial breakdown. He does not complain of any pain fever chills. He states he has been using his compression pump in some form of stocking that I could not really determine the type. He did not come into the clinic with anything on his legs. He tells me he has an appointment with Dr. Merita Norton his nephrologist I believe this Friday. Paradoxically his weight is actually down to 385 pounds I believe last week he was over 400 06/14/18; arrives with better looking wound surfaces today on both legs. There is no further blistering however there are still areas that aren't epithelialized on the right anterior, right lateral, left posterior and left medial. His original wound just above the left ankle laterally is still open moist. His weight was about the same today. He tells me that his nephrologist increase the torsemide from 40/20/09/1939/40 twice a day 06/21/18; both his wraps fell down to his mid calf. Has a result he has multiple blisters across the anterior right leg above with the wraps ultimately watched. He also has blisters on the posterior part of the left calf o2. His original wound on the left lateral calf is hard to see any open area. There is  however a divot. Which is going to be difficult to deal with into the future until the edema in the left leg is controlled. 2 small superficial areas remain He tells [place] his weight was 386 pounds on his scale at home. According to our scale he is up 5 pounds. He is not on a fluid restriction 06/28/18; patient's weight is gone up 2 pounds since last time. He has weeping edema and open superficial wounds on the right posterior right lateral and right anterior  calf. He has a small open area on the left anterior probably left posterior calf and the original wound on the left lateral calf appears to be just about closed He is being planned for a dialysis shunt in the right arm through vein and vascular incoordination with his nephrologist [name] He claims to be using his compression pumps twice a day. He has lymphedema and no doubt systemic fluid volume overload from stage IV chronic renal failure 07/04/18; patient's weight is up into the 396 range. He is supposed to see his cardiologist tomorrow and plans are being made for a shunt by Dr. Delana Meyer. He still has significant open wounds/draining areas on the right anterior and right posterior calf. Several small open areas on the left which are less in terms of wound area on the right which is surprising given the fact the left is the larger most lymphedematous leg Objective Constitutional Patient is hypertensive.. Pulse regular and within target range for patient.[name] Respirations regular, non-labored and within target range.. Temperature is normal and within the target range for the patient.[name] appears in no distress. [name], [name] ([id number]) Vitals Time Taken: 12:45 PM, Height: 76 in, Weight: 385.9 lbs, BMI: 47, Temperature: 98.0 F, Pulse: 55 bpm, Respiratory Rate: 18 breaths/min, Blood Pressure: 198/86 mmHg. Respiratory Respiratory effort is easy and symmetric bilaterally. Rate is normal at rest and on room air.. Cardiovascular Pedal pulses  palpable and strong bilaterally.. Dear chronic lymphedema left greater than right with some degree of venous inflammation. Psychiatric Patient appears depressed today.. General Notes: Wound exam; right anterior and right posterior legs are still open. Left anteriorly there is a small open area. A small open area above the area on the left lateral calf. His original large wound on the left lateral calf just above the lateral malleolus is difficult to see whether there is an open area here not. Integumentary (Hair, Skin) Wound #2 status is Open. Original cause of wound was Gradually Appeared. The wound is located on the Left,Lateral,Posterior Lower Leg. The wound measures 0.1cm length x 0.1cm width x 0.1cm depth; 0.008cm^2 area and 0.001cm^3 volume. There is no tunneling or undermining noted. There is a large amount of serous drainage noted. The wound margin is flat and intact. There is no granulation within the wound bed. There is no necrotic tissue within the wound bed. The periwound skin appearance did not exhibit: Callus, Crepitus, Excoriation, Induration, Rash, Scarring, Dry/Scaly, Maceration, Atrophie Blanche, Cyanosis, Ecchymosis, Hemosiderin Staining, Mottled, Pallor, Rubor, Erythema. Assessment Active Problems ICD-10 Non-pressure chronic ulcer of left calf with necrosis of muscle Lymphedema, not elsewhere classified Chronic venous hypertension (idiopathic) with ulcer and inflammation of left lower extremity Type 2 diabetes mellitus with other skin ulcer Non-pressure chronic ulcer of right calf limited to breakdown of skin Plan #1 we'll continue with absorbent dressings as the primary care Kerramax or drawtex. 4-layer compression. He has substantial weeping areas right anterior and lateral which requires special attention #2 he claims to be using her compression pumps twice a day. I think he is faced with systemic fluid volume overload related to his chronic renal failure which I  think close to requiring dialysis Electronic Signature(s) [name], [name] ([id number]) Signed: 07/05/2018 4:33:49 PM By: Linton Ham MD Entered By: Linton Ham on 07/05/2018 13:36:50 Boschee, Maximum A. ([id number]) -------------------------------------------------------------------------------- SuperBill Details Patient Name: Schillaci, Daray A. Date of Service: 07/05/2018 Medical Record Number: [id number] Patient Account Number: [id number] Date of Birth/Sex: 1971/02/01 (47 y.o. M) Treating RN: [name] Primary Care Provider:  Gaetano [name] Other Clinician: Referring Provider: Gaetano [name] Treating Provider/Extender: [name] in Treatment: 27 Diagnosis Coding ICD-10 Codes Code Description [phone] Non-pressure chronic ulcer of left calf with necrosis of muscle I89.0 Lymphedema, not elsewhere classified I87.332 Chronic venous hypertension (idiopathic) with ulcer and inflammation of left lower extremity E11.622 Type 2 diabetes mellitus with other skin ulcer L97.211 Non-pressure chronic ulcer of right calf limited to breakdown of skin Facility Procedures CPT4: Description Modifier Quantity Code [id number] BILATERAL: Application of multi-layer venous compression system; leg (below 1 knee), including ankle and foot. Physician Procedures CPT4 Code: 6606004 Description: 59977 - WC PHYS LEVEL 3 - EST PT ICD-10 Diagnosis Description L97.223 Non-pressure chronic ulcer of left calf with necrosis of mu L97.211 Non-pressure chronic ulcer of right calf limited to breakdo I89.0 Lymphedema, not elsewhere classified Modifier: scle wn of skin Quantity: 1 Electronic Signature(s) Signed: 07/05/2018 4:36:28 PM By: Gretta Cool, BSN, RN, CWS, Kim RN, BSN Signed: 07/05/2018 4:37:14 PM By: Linton Ham MD Previous Signature: 07/05/2018 4:33:49 PM Version By: Linton Ham MD Entered By: Gretta Cool, BSN, RN, CWS, Kim on 07/05/2018 16:36:28

## 2018-07-08 NOTE — Progress Notes (Signed)
[name], [name] ([id number]) Visit Report for 07/05/2018 Arrival Information Details Patient Name: [name], [name] Date of Service: 07/05/2018 12:45 PM Medical Record Number: [id number] Patient Account Number: [id number] Date of Birth/Sex: [birth year] (47 y.o. M) Treating RN: Cornell Barman Primary Care Danon Lograsso: Gaetano Net Other Clinician: Referring Bassam Dresch: Gaetano Net Treating Geroldine Esquivias/Extender: Tito Dine in Treatment: 37 Visit Information History Since Last Visit Added or deleted any medications: No Patient Arrived: Ambulatory Any new allergies or adverse reactions: No Arrival Time: 12:43 Had a fall or experienced change in No Accompanied By: self activities of daily living that may affect Transfer Assistance: None risk of falls: Patient Identification Verified: Yes Signs or symptoms of abuse/neglect since last visito No Secondary Verification Process Yes Hospitalized since last visit: No Completed: Implantable device outside of the clinic excluding No Patient Requires Transmission-Based No cellular tissue based products placed in the center Precautions: since last visit: Patient Has Alerts: Yes Has Dressing in Place as Prescribed: Yes Patient Alerts: DM II Pain Present Now: No noncompressible bilateral Electronic Signature(s) Signed: 07/06/2018 10:02:59 AM By: Gretta Cool, BSN, RN, CWS, Kim RN, BSN Entered By: Gretta Cool, BSN, RN, CWS, Kim on 07/05/2018 12:44:50 [name], [name] ([id number]) -------------------------------------------------------------------------------- Compression Therapy Details Patient Name: [name], [name] Date of Service: 07/05/2018 12:45 PM Medical Record Number: [id number] Patient Account Number: [id number] Date of Birth/Sex: 1970/11/05 (47 y.o. M) Treating RN: Montey Hora Primary Care Halen Mossbarger: Gaetano Net Other Clinician: Referring Naelani Lafrance: Gaetano Net Treating Keatyn Jawad/Extender: Tito Dine in Treatment: 27 Compression  Therapy Performed for Wound Assessment: Wound #2 Left,Lateral,Posterior Lower Leg Performed By: Clinician Montey Hora, RN Compression Type: Four Layer Post Procedure Diagnosis Same as Pre-procedure Electronic Signature(s) Signed: 07/06/2018 5:01:53 PM By: Montey Hora Entered By: Montey Hora on 07/05/2018 13:42:42 [name], [name] ([id number]) -------------------------------------------------------------------------------- Encounter Discharge Information Details Patient Name: [name], [name] Date of Service: 07/05/2018 12:45 PM Medical Record Number: [id number] Patient Account Number: [id number] Date of Birth/Sex: May 28, 1971 (47 y.o. M) Treating RN: Montey Hora Primary Care Rickelle Sylvestre: Gaetano Net Other Clinician: Referring Domenique Quest: Gaetano Net Treating Murielle Stang/Extender: Tito Dine in Treatment: 78 Encounter Discharge Information Items Discharge Condition: Stable Ambulatory Status: Ambulatory Discharge Destination: Home Transportation: Private Auto Accompanied By: self Schedule Follow-up Appointment: Yes Clinical Summary of Care: Electronic Signature(s) Signed: 07/06/2018 5:01:53 PM By: Montey Hora Entered By: Montey Hora on 07/05/2018 13:44:19 [name], [name] ([id number]) -------------------------------------------------------------------------------- Lower Extremity Assessment Details Patient Name: [name], [name] Date of Service: 07/05/2018 12:45 PM Medical Record Number: [id number] Patient Account Number: [id number] Date of Birth/Sex: 05-23-1971 (47 y.o. M) Treating RN: Cornell Barman Primary Care Traven Davids: Gaetano Net Other Clinician: Referring Vallery Mcdade: Gaetano Net Treating Gerlad Pelzel/Extender: Tito Dine in Treatment: 27 Edema Assessment Assessed: [Left: No] [Right: No] [Left: Edema] [Right: :] Calf Left: Right: Point of Measurement: 36 cm From Medial Instep 67 cm 58 cm Ankle Left: Right: Point of Measurement: 14 cm From  Medial Instep 53 cm 43 cm Vascular Assessment Pulses: Dorsalis Pedis Palpable: [Left:Yes] [Right:Yes] Posterior Tibial Extremity colors, hair growth, and conditions: Extremity Color: [Left:Hyperpigmented] [Right:Hyperpigmented] Hair Growth on Extremity: [Left:No] [Right:No] Temperature of Extremity: [Left:Warm] [Right:Warm] Capillary Refill: [Left:< 3 seconds] [Right:< 3 seconds] Toe Nail Assessment Left: Right: Thick: Yes Yes Discolored: Yes Yes Deformed: Yes Yes Improper Length and Hygiene: Yes Yes Electronic Signature(s) Signed: 07/06/2018 10:02:59 AM By: Gretta Cool, BSN, RN, CWS, Kim RN, BSN Entered By: Gretta Cool, BSN, RN, CWS, Kim on 07/05/2018 13:02:47 [name], [name] ([id number]) -------------------------------------------------------------------------------- Multi Wound Chart Details Patient  Name: [name], [name] Date of Service: 07/05/2018 12:45 PM Medical Record Number: [id number] Patient Account Number: [id number] Date of Birth/Sex: 12/09/1970 (47 y.o. M) Treating RN: Montey Hora Primary Care Rica Heather: Gaetano Net Other Clinician: Referring Jequan Shahin: Gaetano Net Treating Kristapher [place]/Extender: Tito Dine in Treatment: 27 Vital Signs Height(in): 76 Pulse(bpm): 55 Weight(lbs): 385.9 Blood Pressure(mmHg): 198/86 Body Mass Index(BMI): 47 Temperature(F): 98.0 Respiratory Rate 18 (breaths/min): Photos: [2:No Photos] [N/A:N/A] Wound Location: [2:Left Lower Leg - Lateral, Posterior] [N/A:N/A] Wounding Event: [2:Gradually Appeared] [N/A:N/A] Primary Etiology: [2:Diabetic Wound/Ulcer of the Lower Extremity] [N/A:N/A] Secondary Etiology: [2:Lymphedema] [N/A:N/A] Comorbid History: [2:Hypertension, Type II Diabetes, Neuropathy] [N/A:N/A] Date Acquired: [2:09/30/2017] [N/A:N/A] Weeks of Treatment: [2:27] [N/A:N/A] Wound Status: [2:Open] [N/A:N/A] Measurements L x W x D [2:0.1x0.1x0.1] [N/A:N/A] (cm) Area (cm) : [2:0.008] [N/A:N/A] Volume (cm) : [2:0.001]  [N/A:N/A] % Reduction in Area: [2:100.00%] [N/A:N/A] % Reduction in Volume: [2:100.00%] [N/A:N/A] Classification: [2:Grade 2] [N/A:N/A] Exudate Amount: [2:Large] [N/A:N/A] Exudate Type: [2:Serous] [N/A:N/A] Exudate Color: [2:amber] [N/A:N/A] Wound Margin: [2:Flat and Intact] [N/A:N/A] Granulation Amount: [2:None Present (0%)] [N/A:N/A] Necrotic Amount: [2:None Present (0%)] [N/A:N/A] Exposed Structures: [2:Fascia: No Fat Layer (Subcutaneous Tissue) Exposed: No Tendon: No Muscle: No Joint: No Bone: No] [N/A:N/A] Epithelialization: [2:Large (67-100%)] [N/A:N/A] Periwound Skin Texture: [2:Excoriation: No Induration: No Callus: No Crepitus: No] [N/A:N/A] Rash: No Scarring: No Periwound Skin Moisture: Maceration: No N/A N/A Dry/Scaly: No Periwound Skin Color: Atrophie Blanche: No N/A N/A Cyanosis: No Ecchymosis: No Erythema: No Hemosiderin Staining: No Mottled: No Pallor: No Rubor: No Tenderness on Palpation: No N/A N/A Wound Preparation: Ulcer Cleansing: N/A N/A Rinsed/Irrigated with Saline, Other: soap and water Topical Anesthetic Applied: Other: lidocaine 4% Treatment Notes Electronic Signature(s) Signed: 07/05/2018 4:33:49 PM By: Linton Ham MD Entered By: Linton Ham on 07/05/2018 13:26:32 [name], [name] Kitchen ([id number]) -------------------------------------------------------------------------------- Multi-Disciplinary Care Plan Details Patient Name: [name], [name] Date of Service: 07/05/2018 12:45 PM Medical Record Number: [id number] Patient Account Number: [id number] Date of Birth/Sex: July 29, 1971 (47 y.o. M) Treating RN: Montey Hora Primary Care Kaytlynn Kochan: Gaetano Net Other Clinician: Referring Lavontae Cornia: Gaetano Net Treating Shyann Hefner/Extender: Tito Dine in Treatment: 98 Active Inactive ` Abuse / Safety / Falls / Self Care Management Nursing Diagnoses: Potential for falls Goals: Patient will remain injury free related to falls Date  Initiated: 12/28/2017 Target Resolution Date: 04/08/2018 Goal Status: Active Interventions: Assess fall risk on admission and as needed Notes: ` Nutrition Nursing Diagnoses: Impaired glucose control: actual or potential Goals: Patient/caregiver verbalizes understanding of need to maintain therapeutic glucose control per primary care physician Date Initiated: 12/28/2017 Target Resolution Date: 04/08/2018 Goal Status: Active Interventions: Provide education on elevated blood sugars and impact on wound healing Notes: ` Orientation to the Wound Care Program Nursing Diagnoses: Knowledge deficit related to the wound healing center program Goals: Patient/caregiver will verbalize understanding of the [facility] Date Initiated: 12/28/2017 Target Resolution Date: 04/08/2018 Goal Status: Active Interventions: Cranshaw, Reiley A. ([id number]) Provide education on orientation to the wound center Notes: ` Venous Leg Ulcer Nursing Diagnoses: Knowledge deficit related to disease process and management Goals: Patient will maintain optimal edema control Date Initiated: 03/27/2018 Target Resolution Date: 04/27/2018 Goal Status: Active Interventions: Assess peripheral edema status every visit. Compression as ordered Treatment Activities: Therapeutic compression applied : 03/22/2018 Notes: ` Wound/Skin Impairment Nursing Diagnoses: Impaired tissue integrity Goals: Ulcer/skin breakdown will heal within 14 weeks Date Initiated: 12/28/2017 Target Resolution Date: 04/08/2018 Goal Status: Active Interventions: Assess patient/caregiver ability to obtain necessary supplies Assess patient/caregiver ability to perform ulcer/skin care  regimen upon admission and as needed Assess ulceration(s) every visit Notes: Electronic Signature(s) Signed: 07/06/2018 5:01:53 PM By: Montey Hora Entered By: Montey Hora on 07/05/2018 13:11:40 [name], [name]  ([id number]) -------------------------------------------------------------------------------- Pain Assessment Details Patient Name: [name], [name] Date of Service: 07/05/2018 12:45 PM Medical Record Number: [id number] Patient Account Number: [id number] Date of Birth/Sex: [birth year] (47 y.o. M) Treating RN: Cornell Barman Primary Care Joron Velis: Gaetano Net Other Clinician: Referring Herminio Kniskern: Gaetano Net Treating Keziah Avis/Extender: Tito Dine in Treatment: 27 Active Problems Location of Pain Severity and Description of Pain Patient Has Paino No Site Locations Pain Management and Medication Current Pain Management: Electronic Signature(s) Signed: 07/06/2018 10:02:59 AM By: Gretta Cool, BSN, RN, CWS, Kim RN, BSN Entered By: Gretta Cool, BSN, RN, CWS, Kim on 07/05/2018 12:44:57 [name], [name] ([id number]) -------------------------------------------------------------------------------- Patient/Caregiver Education Details Patient Name: [name], [name] A. Date of Service: 07/05/2018 12:45 PM Medical Record Number: [id number] Patient Account Number: [id number] Date of Birth/Gender: 1971-03-07 (47 y.o. M) Treating RN: Montey Hora Primary Care Physician: Gaetano Net Other Clinician: Referring Physician: Gaetano Net Treating Physician/Extender: Tito Dine in Treatment: 51 Education Assessment Education Provided To: Patient Education Topics Provided Venous: Handouts: Other: leg elevation Methods: Explain/Verbal Responses: State content correctly Electronic Signature(s) Signed: 07/06/2018 5:01:53 PM By: Montey Hora Entered By: Montey Hora on 07/05/2018 13:43:47 [name], [name] ([id number]) -------------------------------------------------------------------------------- Wound Assessment Details Patient Name: Tartaglia, Uziel A. Date of Service: 07/05/2018 12:45 PM Medical Record Number: [id number] Patient Account Number: [id number] Date of Birth/Sex: 04-18-1971 (46 y.o.  M) Treating RN: Cornell Barman Primary Care Bolivar Koranda: Gaetano Net Other Clinician: Referring Rosemary Pentecost: Gaetano Net Treating Tennille Montelongo/Extender: Tito Dine in Treatment: 27 Wound Status Wound Number: 2 Primary Etiology: Diabetic Wound/Ulcer of the Lower Extremity Wound Location: Left Lower Leg - Lateral, Posterior Secondary Lymphedema Wounding Event: Gradually Appeared Etiology: Date Acquired: 09/30/2017 Wound Status: Open Weeks Of Treatment: 27 Comorbid History: Hypertension, Type II Diabetes, Clustered Wound: No Neuropathy Photos Photo Uploaded By: Sharon Mt on 07/05/2018 16:25:32 Wound Measurements Length: (cm) 0.1 Width: (cm) 0.1 Depth: (cm) 0.1 Area: (cm) 0.008 Volume: (cm) 0.001 % Reduction in Area: 100% % Reduction in Volume: 100% Epithelialization: Large (67-100%) Tunneling: No Undermining: No Wound Description Classification: Grade 2 Wound Margin: Flat and Intact Exudate Amount: Large Exudate Type: Serous Exudate Color: amber Foul Odor After Cleansing: No Slough/Fibrino Yes Wound Bed Granulation Amount: None Present (0%) Exposed Structure Necrotic Amount: None Present (0%) Fascia Exposed: No Fat Layer (Subcutaneous Tissue) Exposed: No Tendon Exposed: No Muscle Exposed: No Joint Exposed: No Bone Exposed: No Periwound Skin Texture Reaver, Krew A. ([id number]) Texture Color No Abnormalities Noted: No No Abnormalities Noted: No Callus: No Atrophie Blanche: No Crepitus: No Cyanosis: No Excoriation: No Ecchymosis: No Induration: No Erythema: No Rash: No Hemosiderin Staining: No Scarring: No Mottled: No Pallor: No Moisture Rubor: No No Abnormalities Noted: No Dry / Scaly: No Maceration: No Wound Preparation Ulcer Cleansing: Rinsed/Irrigated with Saline, Other: soap and water, Topical Anesthetic Applied: Other: lidocaine 4%, Treatment Notes Wound #2 (Left, Lateral, Posterior Lower Leg) Notes Silvercell, xsorb on draining  areas, 4layer bilateral Electronic Signature(s) Signed: 07/06/2018 10:02:59 AM By: Gretta Cool, BSN, RN, CWS, Kim RN, BSN Entered By: Gretta Cool, BSN, RN, CWS, Kim on 07/05/2018 12:59:28 Christensen, [name] ([id number]) -------------------------------------------------------------------------------- Vitals Details Patient Name: Kress, Shivank A. Date of Service: 07/05/2018 12:45 PM Medical Record Number: [id number] Patient Account Number: [id number] Date of Birth/Sex: 1970/11/30 (48 y.o. M) Treating RN: Cornell Barman Primary Care Lelah Rennaker: Gaetano Net Other Clinician: Referring  Caycee Wanat: Gaetano Net Treating Kaisyn Millea/Extender: Tito Dine in Treatment: 27 Vital Signs Time Taken: 12:45 Temperature (F): 98.0 Height (in): 76 Pulse (bpm): 55 Weight (lbs): 385.9 Respiratory Rate (breaths/min): 18 Body Mass Index (BMI): 47 Blood Pressure (mmHg): 198/86 Reference Range: 80 - 120 mg / dl Electronic Signature(s) Signed: 07/06/2018 10:02:59 AM By: Gretta Cool, BSN, RN, CWS, Kim RN, BSN Entered By: Gretta Cool, BSN, RN, CWS, Kim on 07/05/2018 12:45:45

## 2018-07-13 ENCOUNTER — Other Ambulatory Visit (INDEPENDENT_AMBULATORY_CARE_PROVIDER_SITE_OTHER): Payer: Self-pay | Admitting: Nurse Practitioner

## 2018-07-14 ENCOUNTER — Other Ambulatory Visit: Payer: Self-pay

## 2018-07-14 ENCOUNTER — Encounter
Admission: RE | Admit: 2018-07-14 | Discharge: 2018-07-14 | Disposition: A | Discharge status: Home / Self Care | Payer: BLUE CROSS/BLUE SHIELD | Source: Ambulatory Visit | Attending: Vascular Surgery | Admitting: Vascular Surgery

## 2018-07-14 DIAGNOSIS — Z01818 Encounter for other preprocedural examination: Secondary | ICD-10-CM | POA: Insufficient documentation

## 2018-07-14 HISTORY — DX: Chronic kidney disease, unspecified: N18.9

## 2018-07-14 HISTORY — DX: Sleep apnea, unspecified: G47.30

## 2018-07-14 HISTORY — DX: Lymphedema, not elsewhere classified: I89.0

## 2018-07-14 LAB — CBC WITH DIFFERENTIAL/PLATELET
ABS IMMATURE GRANULOCYTES: 0.02 10*3/uL (ref 0.00–0.07)
BASOS PCT: 0 %
Basophils Absolute: 0 10*3/uL (ref 0.0–0.1)
EOS ABS: 0.2 10*3/uL (ref 0.0–0.5)
EOS PCT: 3 %
HCT: 30.1 % — ABNORMAL LOW (ref 39.0–52.0)
Hemoglobin: 9.7 g/dL — ABNORMAL LOW (ref 13.0–17.0)
Immature Granulocytes: 0 %
Lymphocytes Relative: 15 %
Lymphs Abs: 0.9 10*3/uL (ref 0.7–4.0)
MCH: 28.8 pg (ref 26.0–34.0)
MCHC: 32.2 g/dL (ref 30.0–36.0)
MCV: 89.3 fL (ref 80.0–100.0)
MONO ABS: 0.5 10*3/uL (ref 0.1–1.0)
Monocytes Relative: 9 %
NEUTROS ABS: 4.3 10*3/uL (ref 1.7–7.7)
Neutrophils Relative %: 73 %
PLATELETS: 193 10*3/uL (ref 150–400)
RBC: 3.37 MIL/uL — ABNORMAL LOW (ref 4.22–5.81)
RDW: 15.5 % (ref 11.5–15.5)
WBC: 5.9 10*3/uL (ref 4.0–10.5)
nRBC: 0 % (ref 0.0–0.2)

## 2018-07-14 LAB — PROTIME-INR
INR: 1.09
PROTHROMBIN TIME: 14 s (ref 11.4–15.2)

## 2018-07-14 LAB — RENAL FUNCTION PANEL
ALBUMIN: 3.2 g/dL — AB (ref 3.5–5.0)
ANION GAP: 7 (ref 5–15)
BUN: 73 mg/dL — ABNORMAL HIGH (ref 6–20)
CALCIUM: 8.5 mg/dL — AB (ref 8.9–10.3)
CO2: 26 mmol/L (ref 22–32)
Chloride: 111 mmol/L (ref 98–111)
Creatinine, Ser: 4.54 mg/dL — ABNORMAL HIGH (ref 0.61–1.24)
GFR calc Af Amer: 16 mL/min — ABNORMAL LOW (ref >60)
GFR, EST NON AFRICAN AMERICAN: 14 mL/min — AB (ref >60)
GLUCOSE: 105 mg/dL — AB (ref 70–99)
PHOSPHORUS: 4.8 mg/dL — AB (ref 2.5–4.6)
Potassium: 4.1 mmol/L (ref 3.5–5.1)
SODIUM: 144 mmol/L (ref 135–145)

## 2018-07-14 LAB — TYPE AND SCREEN
ABO/RH(D): A POS
ANTIBODY SCREEN: NEGATIVE

## 2018-07-14 LAB — APTT: APTT: 37 s — AB (ref 24–36)

## 2018-07-14 NOTE — Patient Instructions (Signed)
Your procedure is scheduled on: Wednesday 07/19/18 Report to [facility]. To find out your arrival time please call [phone] between 1PM - 3PM on Tuesday 07/18/18.  Remember: Instructions that are not followed completely may result in serious medical risk, up to and including death, or upon the discretion of your surgeon and anesthesiologist your surgery may need to be rescheduled.     _X__ 1. Do not eat food after midnight the night before your procedure.                 No gum chewing or hard candies. You may drink clear liquids up to 2 hours                 before you are scheduled to arrive for your surgery- DO not drink clear                 liquids within 2 hours of the start of your surgery.                 Clear Liquids include:  water, apple juice without pulp, clear carbohydrate                 drink such as Clearfast or Gatorade, Black Coffee or Tea (Do not add                 anything to coffee or tea).  __X__2.  On the morning of surgery brush your teeth with toothpaste and water, you                 may rinse your mouth with mouthwash if you wish.  Do not swallow any              toothpaste of mouthwash.     _X__ 3.  No Alcohol for 24 hours before or after surgery.   _X__ 4.  Do Not Smoke or use e-cigarettes For 24 Hours Prior to Your Surgery.                 Do not use any chewable tobacco products for at least 6 hours prior to                 surgery.  ____  5.  Bring all medications with you on the day of surgery if instructed.   __X__  6.  Notify your doctor if there is any change in your medical condition      (cold, fever, infections).     Do not wear jewelry, make-up, hairpins, clips or nail polish. Do not wear lotions, powders, or perfumes.  Do not shave 48 hours prior to surgery. Men may shave face and neck. Do not bring valuables to the hospital.    [facility] is not responsible for any belongings or  valuables.  Contacts, dentures/partials or body piercings may not be worn into surgery. Bring a case for your contacts, glasses or hearing aids, a denture cup will be supplied. Leave your suitcase in the car. After surgery it may be brought to your room. For patients admitted to the hospital, discharge time is determined by your treatment team.   Patients discharged the day of surgery will not be allowed to drive home.   Please read over the following fact sheets that you were given:   MRSA Information  __X__ Take these medicines the morning of surgery with A SIP OF WATER:  1. amLODipine (NORVASC)  2. atenolol (TENORMIN)  3. cloNIDine (CATAPRES)  4. hydrALAZINE (APRESOLINE)  5.  6.  ____ Fleet Enema (as directed)   __X__ Use CHG Soap/SAGE wipes as directed  ____ Use inhalers on the day of surgery  ____ Stop metformin/Janumet/Farxiga 2 days prior to surgery    __X__ Take 1/2 of usual insulin dose the night before surgery. No insulin the morning          of surgery.   ____ Stop Blood Thinners Coumadin/Plavix/Xarelto/Pleta/Pradaxa/Eliquis/Effient/Aspirin  on   Or contact your Surgeon, Cardiologist or Medical Doctor regarding  ability to stop your blood thinners  __X__ Stop Anti-inflammatories 7 days before surgery such as Advil, Ibuprofen, Motrin,  BC or Goodies Powder, Naprosyn, Naproxen, Aleve, Aspirin    __X__ Stop all herbal supplements, fish oil or vitamin E until after surgery.  OTHER VITAMINS AND IRON OK TO CONTINUE  ____ Bring C-Pap to the hospital.

## 2018-07-15 NOTE — Pre-Procedure Instructions (Signed)
Met B, CBC, & PTT results sent to Dr. Schnier and Anesthesia for review. 

## 2018-07-18 ENCOUNTER — Encounter: Payer: Self-pay | Admitting: Anesthesiology

## 2018-07-18 ENCOUNTER — Other Ambulatory Visit (INDEPENDENT_AMBULATORY_CARE_PROVIDER_SITE_OTHER): Payer: Self-pay | Admitting: Nurse Practitioner

## 2018-07-18 MED ORDER — CEFAZOLIN SODIUM-DEXTROSE 1-4 GM/50ML-% IV SOLN: 1.0000 g | INTRAVENOUS | Status: DC

## 2018-07-19 ENCOUNTER — Other Ambulatory Visit: Payer: Self-pay

## 2018-07-19 ENCOUNTER — Ambulatory Visit: Payer: BLUE CROSS/BLUE SHIELD | Admitting: Anesthesiology

## 2018-07-19 ENCOUNTER — Ambulatory Visit: Payer: BLUE CROSS/BLUE SHIELD | Admitting: Internal Medicine

## 2018-07-19 ENCOUNTER — Ambulatory Visit
Admission: RE | Admit: 2018-07-19 | Discharge: 2018-07-19 | Disposition: A | Discharge status: Home / Self Care | Payer: BLUE CROSS/BLUE SHIELD | Source: Ambulatory Visit | Attending: Vascular Surgery | Admitting: Vascular Surgery

## 2018-07-19 ENCOUNTER — Encounter
Admission: RE | Disposition: A | Discharge status: Home / Self Care | Payer: Self-pay | Source: Ambulatory Visit | Attending: Vascular Surgery

## 2018-07-19 DIAGNOSIS — Z992 Dependence on renal dialysis: Secondary | ICD-10-CM | POA: Diagnosis not present

## 2018-07-19 DIAGNOSIS — Z833 Family history of diabetes mellitus: Secondary | ICD-10-CM | POA: Diagnosis not present

## 2018-07-19 DIAGNOSIS — I83023 Varicose veins of left lower extremity with ulcer of ankle: Secondary | ICD-10-CM | POA: Diagnosis not present

## 2018-07-19 DIAGNOSIS — Z888 Allergy status to other drugs, medicaments and biological substances status: Secondary | ICD-10-CM | POA: Diagnosis not present

## 2018-07-19 DIAGNOSIS — I120 Hypertensive chronic kidney disease with stage 5 chronic kidney disease or end stage renal disease: Secondary | ICD-10-CM | POA: Diagnosis not present

## 2018-07-19 DIAGNOSIS — I83013 Varicose veins of right lower extremity with ulcer of ankle: Secondary | ICD-10-CM | POA: Diagnosis not present

## 2018-07-19 DIAGNOSIS — E1122 Type 2 diabetes mellitus with diabetic chronic kidney disease: Secondary | ICD-10-CM | POA: Insufficient documentation

## 2018-07-19 DIAGNOSIS — L97328 Non-pressure chronic ulcer of left ankle with other specified severity: Secondary | ICD-10-CM | POA: Insufficient documentation

## 2018-07-19 DIAGNOSIS — Z9889 Other specified postprocedural states: Secondary | ICD-10-CM | POA: Insufficient documentation

## 2018-07-19 DIAGNOSIS — N186 End stage renal disease: Secondary | ICD-10-CM

## 2018-07-19 DIAGNOSIS — L97318 Non-pressure chronic ulcer of right ankle with other specified severity: Secondary | ICD-10-CM | POA: Insufficient documentation

## 2018-07-19 DIAGNOSIS — I890 Lymphedema, not elsewhere classified: Secondary | ICD-10-CM | POA: Insufficient documentation

## 2018-07-19 DIAGNOSIS — Z841 Family history of disorders of kidney and ureter: Secondary | ICD-10-CM | POA: Diagnosis not present

## 2018-07-19 DIAGNOSIS — E11621 Type 2 diabetes mellitus with foot ulcer: Secondary | ICD-10-CM | POA: Diagnosis not present

## 2018-07-19 DIAGNOSIS — I872 Venous insufficiency (chronic) (peripheral): Secondary | ICD-10-CM | POA: Diagnosis not present

## 2018-07-19 HISTORY — PX: AV FISTULA INSERTION W/ RF MAGNETIC GUIDANCE: CATH118308

## 2018-07-19 LAB — GLUCOSE, CAPILLARY
GLUCOSE-CAPILLARY: 89 mg/dL (ref 70–99)
GLUCOSE-CAPILLARY: 96 mg/dL (ref 70–99)
Glucose-Capillary: 104 mg/dL — ABNORMAL HIGH (ref 70–99)

## 2018-07-19 SURGERY — AV FISTULA INSERTION W/RF MAGNETIC GUIDANCE
Anesthesia: General | Laterality: Right

## 2018-07-19 MED ORDER — FENTANYL CITRATE (PF) 100 MCG/2ML IJ SOLN
INTRAMUSCULAR | Status: AC
  Filled 2018-07-19: qty 2

## 2018-07-19 MED ORDER — VERAPAMIL HCL 2.5 MG/ML IV SOLN
INTRAVENOUS | Status: AC
  Filled 2018-07-19: qty 2

## 2018-07-19 MED ORDER — SODIUM CHLORIDE (PF) 0.9 % IJ SOLN
INTRAMUSCULAR | Status: AC
  Filled 2018-07-19: qty 10

## 2018-07-19 MED ORDER — ROCURONIUM BROMIDE 100 MG/10ML IV SOLN
INTRAVENOUS | Status: DC | PRN
  Administered 2018-07-19: 45 mg via INTRAVENOUS
  Administered 2018-07-19: 5 mg via INTRAVENOUS

## 2018-07-19 MED ORDER — SODIUM CHLORIDE 0.9 % IV SOLN
INTRAVENOUS | Status: DC
  Administered 2018-07-19: 1000 mL via INTRAVENOUS

## 2018-07-19 MED ORDER — SODIUM CHLORIDE 0.9 % IV SOLN
INTRAVENOUS | Status: DC | PRN
  Administered 2018-07-19: 20 ug/min via INTRAVENOUS

## 2018-07-19 MED ORDER — ONDANSETRON HCL 4 MG/2ML IJ SOLN: 4.0000 mg | Freq: Once | INTRAMUSCULAR | Status: DC | PRN

## 2018-07-19 MED ORDER — EPHEDRINE SULFATE 50 MG/ML IJ SOLN
INTRAMUSCULAR | Status: DC | PRN
  Administered 2018-07-19 (×3): 5 mg via INTRAVENOUS

## 2018-07-19 MED ORDER — ROCURONIUM BROMIDE 50 MG/5ML IV SOLN
INTRAVENOUS | Status: AC
  Filled 2018-07-19: qty 1

## 2018-07-19 MED ORDER — CEFAZOLIN SODIUM-DEXTROSE 1-4 GM/50ML-% IV SOLN
INTRAVENOUS | Status: AC
  Filled 2018-07-19: qty 50

## 2018-07-19 MED ORDER — OXYCODONE-ACETAMINOPHEN 5-325 MG PO TABS
ORAL_TABLET | ORAL | Status: AC
  Administered 2018-07-19: 1 via ORAL
  Filled 2018-07-19: qty 1

## 2018-07-19 MED ORDER — SUGAMMADEX SODIUM 500 MG/5ML IV SOLN
INTRAVENOUS | Status: AC
  Filled 2018-07-19: qty 5

## 2018-07-19 MED ORDER — OXYCODONE-ACETAMINOPHEN 5-325 MG PO TABS: 1.0000 | ORAL_TABLET | Freq: Four times a day (QID) | ORAL | 0 refills | Status: DC | PRN

## 2018-07-19 MED ORDER — LIDOCAINE HCL (PF) 2 % IJ SOLN
INTRAMUSCULAR | Status: AC
  Filled 2018-07-19: qty 10

## 2018-07-19 MED ORDER — SODIUM CHLORIDE (PF) 0.9 % IJ SOLN
INTRAMUSCULAR | Status: AC
  Filled 2018-07-19: qty 20

## 2018-07-19 MED ORDER — ONDANSETRON HCL 4 MG/2ML IJ SOLN
INTRAMUSCULAR | Status: DC | PRN
  Administered 2018-07-19: 4 mg via INTRAVENOUS

## 2018-07-19 MED ORDER — ONDANSETRON HCL 4 MG/2ML IJ SOLN: 4.0000 mg | Freq: Four times a day (QID) | INTRAMUSCULAR | Status: DC | PRN

## 2018-07-19 MED ORDER — PROPOFOL 10 MG/ML IV BOLUS
INTRAVENOUS | Status: DC | PRN
  Administered 2018-07-19: 200 mg via INTRAVENOUS

## 2018-07-19 MED ORDER — SUCCINYLCHOLINE CHLORIDE 20 MG/ML IJ SOLN
INTRAMUSCULAR | Status: AC
  Filled 2018-07-19: qty 1

## 2018-07-19 MED ORDER — FENTANYL CITRATE (PF) 100 MCG/2ML IJ SOLN
25.0000 ug | INTRAMUSCULAR | Status: DC | PRN
  Administered 2018-07-19 (×4): 25 ug via INTRAVENOUS

## 2018-07-19 MED ORDER — FAMOTIDINE 20 MG PO TABS: 20.0000 mg | ORAL_TABLET | Freq: Once | ORAL | Status: DC

## 2018-07-19 MED ORDER — LIDOCAINE HCL (CARDIAC) PF 100 MG/5ML IV SOSY
PREFILLED_SYRINGE | INTRAVENOUS | Status: DC | PRN
  Administered 2018-07-19: 100 mg via INTRAVENOUS

## 2018-07-19 MED ORDER — IOPAMIDOL (ISOVUE-300) INJECTION 61%
INTRAVENOUS | Status: DC | PRN
  Administered 2018-07-19: 30 mL via INTRAVENOUS

## 2018-07-19 MED ORDER — SUCCINYLCHOLINE CHLORIDE 20 MG/ML IJ SOLN
INTRAMUSCULAR | Status: DC | PRN
  Administered 2018-07-19: 180 mg via INTRAVENOUS

## 2018-07-19 MED ORDER — OXYCODONE-ACETAMINOPHEN 5-325 MG PO TABS
1.0000 | ORAL_TABLET | Freq: Once | ORAL | Status: AC
  Administered 2018-07-19: 1 via ORAL

## 2018-07-19 MED ORDER — EPHEDRINE SULFATE 50 MG/ML IJ SOLN
INTRAMUSCULAR | Status: AC
  Filled 2018-07-19: qty 1

## 2018-07-19 MED ORDER — PROPOFOL 10 MG/ML IV BOLUS
INTRAVENOUS | Status: AC
  Filled 2018-07-19: qty 20

## 2018-07-19 MED ORDER — FENTANYL CITRATE (PF) 100 MCG/2ML IJ SOLN
INTRAMUSCULAR | Status: DC | PRN
  Administered 2018-07-19 (×2): 50 ug via INTRAVENOUS

## 2018-07-19 MED ORDER — HEPARIN SODIUM (PORCINE) 1000 UNIT/ML IJ SOLN
INTRAMUSCULAR | Status: DC | PRN
  Administered 2018-07-19: 3000 [IU] via INTRAVENOUS

## 2018-07-19 MED ORDER — SUGAMMADEX SODIUM 500 MG/5ML IV SOLN
INTRAVENOUS | Status: DC | PRN
  Administered 2018-07-19: 360 mg via INTRAVENOUS

## 2018-07-19 MED ORDER — ONDANSETRON HCL 4 MG/2ML IJ SOLN
INTRAMUSCULAR | Status: AC
  Filled 2018-07-19: qty 2

## 2018-07-19 MED ORDER — HEPARIN SODIUM (PORCINE) 1000 UNIT/ML IJ SOLN
INTRAMUSCULAR | Status: AC
  Filled 2018-07-19: qty 1

## 2018-07-19 MED ORDER — NITROGLYCERIN 5 MG/ML IV SOLN
INTRAVENOUS | Status: AC
  Filled 2018-07-19: qty 10

## 2018-07-19 MED ORDER — HYDROMORPHONE HCL 1 MG/ML IJ SOLN: 1.0000 mg | Freq: Once | INTRAMUSCULAR | Status: DC | PRN

## 2018-07-19 SURGICAL SUPPLY — 18 items
CATH BEACON 5 .035 40 KMP TP (CATHETERS) ×1 IMPLANT
CATH BEACON 5 .038 40 KMP TP (CATHETERS) ×2
CATH MICROCATH PRGRT 2.8F 110 (CATHETERS) ×1 IMPLANT
CATH WAVELINQ 4FR (CATHETERS) ×3 IMPLANT
DEVICE OCCLUSION POD6 (Vascular Products) ×1 IMPLANT
DEVICE OCCLUSION PODJ15 (Vascular Products) ×1 IMPLANT
HANDLE DETACHMENT COIL (MISCELLANEOUS) ×3 IMPLANT
MICROCATH PROGREAT 2.8F 110 CM (CATHETERS) ×3
NEEDLE ENTRY 21GA 7CM ECHOTIP (NEEDLE) ×3 IMPLANT
OCCLUSION DEVICE POD6 (Vascular Products) ×3 IMPLANT
OCCLUSION DEVICE PODJ15 (Vascular Products) ×3 IMPLANT
PACK ANGIOGRAPHY (CUSTOM PROCEDURE TRAY) ×3 IMPLANT
PENCIL ELECTRO HAND CTR (MISCELLANEOUS) ×3 IMPLANT
SET INTRO CAPELLA COAXIAL (SET/KITS/TRAYS/PACK) ×9 IMPLANT
SHEATH GLIDE SLENDER 4/5FR (SHEATH) ×6 IMPLANT
SHIELD RADPAD SCOOP 12X17 (MISCELLANEOUS) ×3 IMPLANT
VALVE HEMO TOUHY BORST Y (ADAPTER) ×3 IMPLANT
WIRE SPARTACORE .014X190CM (WIRE) ×6 IMPLANT

## 2018-07-19 NOTE — Anesthesia Preprocedure Evaluation (Addendum)
Anesthesia Evaluation  Patient identified by MRN, date of birth, ID band Patient awake    Reviewed: Allergy & Precautions, NPO status , Patient's Chart, lab work & pertinent test results, reviewed documented beta blocker date and time   Airway Mallampati: III  TM Distance: >3 FB     Dental  (+) Chipped, Partial Upper, Missing   Pulmonary sleep apnea ,           Cardiovascular hypertension, Pt. on medications and Pt. on home beta blockers      Neuro/Psych    GI/Hepatic   Endo/Other  diabetes, Type 2Morbid obesity  Renal/GU Renal disease     Musculoskeletal   Abdominal   Peds  Hematology   Anesthesia Other Findings Gout. Hb 9.7. HR 50. Sats usually run 95%. EKG ok.  Reproductive/Obstetrics                            Anesthesia Physical Anesthesia Plan  ASA: III  Anesthesia Plan: General   Post-op Pain Management:    Induction: Intravenous  PONV Risk Score and Plan:   Airway Management Planned: Oral ETT  Additional Equipment:   Intra-op Plan:   Post-operative Plan:   Informed Consent: I have reviewed the patients History and Physical, chart, labs and discussed the procedure including the risks, benefits and alternatives for the proposed anesthesia with the patient or authorized representative who has indicated his/her understanding and acceptance.     Plan Discussed with: CRNA  Anesthesia Plan Comments:         Anesthesia Quick Evaluation

## 2018-07-19 NOTE — H&P (Signed)
[facility] History & Physical Update  The patient was interviewed and re-examined.  The patient's previous History and Physical has been reviewed and is unchanged.  There is no change in the plan of care. We plan to proceed with the scheduled procedure.  [name], MD  07/19/2018, 8:54 AM

## 2018-07-19 NOTE — Op Note (Signed)
[facility]  Percutaneous Study/Intervention Procedural Note   Date of Surgery: 07/19/2018,1:28 PM  Surgeon: [name]  Pre-operative Diagnosis: End-stage renal disease requiring hemodialysis  Post-operative diagnosis:  Same  Procedure(s) Performed:  1.  Placement Terumo 5 French glide sheath right ulnar vein with ultrasound guidance  2.  Placement Terumo 5 French glide sheath right ulnar artery with ultrasound guidance  3.  Right arm arteriography first-order catheter placement  4.  Right arm venography  5.  Creation of an ulnar ulnar fistula using radiofrequency with the [facility] system  6.  Coil embolization of the left brachial vein    Anesthesia: General anesthesia  Sheath: Terumo 5 French glide sheath left brachial vein and Terumo 5 French glide sheath left brachial artery  Contrast: 30 cc   Fluoroscopy Time: 11.1 minutes  Indications: Patient presents with end-stage renal disease and will require hemodialysis.  Therefore appropriate upper extremity access is being created.  The patient has been preoperatively vein mapped and is found to be a good candidate for the [facility] radiofrequency system.  Risks and benefits of been reviewed all questions been answered patient has been shown a video of the [place].  The patient agrees to proceed with surgery   Procedure:  [name] a 47 y.o. male who was identified and appropriate procedural time out was performed.  The patient was then placed supine on the special procedures table with the right arm extended palm upward and secured to the armboard in the standard fashion.  The right arm was then prepped and draped in the usual sterile fashion.    Ultrasound was used to evaluate the right ulnar vein.  The ulnar vein was echolucent and compressible indicating it is patent .  An ultrasound image was acquired for the permanent record.  A micropuncture needle was used to access the right ulnar  vein under direct ultrasound guidance.  The microwire was then advanced under fluoroscopic guidance without difficulty followed by the micro-sheath.  Hand-injection contrast was then used to demonstrate the venous anatomy.  Ultrasound was then used to evaluate the right ulnar artery.  The brachial artery was echolucent and pulsatile indicating patency.  An ultrasound image was acquired for the permanent record.  A micropuncture needle was then used to access the right ulnar artery.  Microwire was then advanced followed by the micro sheath.  Hand-injection of contrast was then used to create the initial arteriogram.  A 0.018 wire was then negotiated under direct visualization down into the more distal brachial artery and the micro-sheath was then upsized to a 5 [place] glide sheath.  The 0.018 wire and Kumpe catheter were then advanced into the mid brachial artery.  Hand-injection contrast was then used to demonstrate the anatomy of the ulnar artery.  This represents first-order catheter placement.  Once hand-injection and confirmed positioning within the ulnar artery the 0.018 wire was reintroduced.    The venous sheath was then upsized to a 5 [place] glide sheath and using a combination of a Kumpe catheter and a 0.018 wire the wire catheter combination was negotiated into the brachial vein.  This was accomplished utilizing several hand injections to map your way down to this level.  Once the target site had been reached a prograde catheter was then inserted through the Kumpe catheter.  Subsequently, 2 Ruby coils were deployed successfully occluding the brachial vein in preparation for creating maximal flow through the fistula.  The initial coil deployed was a Ruby pod 6  mm x 50 cm and the second coil deployed was a packing coil 15 cm in length.  Once the brachial vein had been successfully occluded the 0.018 wire was reintroduced and the Kumpe catheter removed.  Once the 2 wires had been positioned side-by-side  in the ulnar artery and vein the detector was moved across the field with fluoroscopy to choose the view demonstrating the maximal separation between the wires.  Arteriography was now performed by hand in a magnified image centered over the common ulnar artery, the [place] arterial catheter was advanced down to the level of the common ulnar artery and positioned so that the ceramic plate was facing the venous wire.  The [place] venous catheter was then advanced through the 5 French glide sheath and positioned opposite to the arterial catheter.  The catheters were then visualized in a meticulous fashion to ensure there was no wire wrap that the electrode is facing the ceramic plate and that the magnets are lined up and separated by no greater than 2 mm.  This was accomplished in multiple different obliquities.  The electrode was then deployed.  Ultimately the optimal view was an oblique view and a coned in magnified image was used to verify the electrode was against the ceramic plate.  We then returned to the working view that was initially determined as noted above.  Both the arterial and venous wires were then pulled back proximal to the catheters.   Once the catheter position had been verified the RF pulse was delivered the electrode was visually noted to touch the ceramic plate.  The electrode was then recaptured and the catheter and wire removed from the venous sheath.  Catheter and wire were removed from the arterial sheath.  Hand-injection through the arterial sheath confirmed successful fistula creation.  Final fistulogram was then performed confirming rapid flow of contrast through the arteriovenous fistula with filling of the superficial system.  Both sheaths were then removed and manual pressure was held for 10 minutes.  TR band was then applied for hemostasis at the level of the wrist.   Disposition: Patient was taken to the recovery room in stable condition having tolerated the procedure  well.  Belenda Cruise Dlynn Ranes 07/19/2018,1:28 PM

## 2018-07-19 NOTE — Transfer of Care (Signed)
Immediate Anesthesia Transfer of Care Note  Patient: [name]  Procedure(s) Performed: AV FISTULA INSERTION W/RF MAGNETIC GUIDANCE (Right )  Patient Location: PACU  Anesthesia Type:General  Level of Consciousness: drowsy and responds to stimulation  Airway & Oxygen Therapy: Patient Spontanous Breathing and Patient connected to face mask oxygen  Post-op Assessment: Report given to RN and Post -op Vital signs reviewed and stable  Post vital signs: Reviewed and stable  Last Vitals:  Vitals Value Taken Time  BP 162/80 07/19/2018  1:16 PM  Temp    Pulse 51 07/19/2018  1:16 PM  Resp 10 07/19/2018  1:16 PM  SpO2 96 % 07/19/2018  1:16 PM    Last Pain:  Vitals:   07/19/18 0857  TempSrc: Oral  PainSc: 0-No pain         Complications: No apparent anesthesia complications

## 2018-07-19 NOTE — Anesthesia Postprocedure Evaluation (Signed)
Anesthesia Post Note  Patient: [name]  Procedure(s) Performed: AV FISTULA INSERTION W/RF MAGNETIC GUIDANCE (Right )  Patient location during evaluation: PACU Anesthesia Type: General Level of consciousness: awake and alert Pain management: pain level controlled Vital Signs Assessment: post-procedure vital signs reviewed and stable Respiratory status: spontaneous breathing, nonlabored ventilation, respiratory function stable and patient connected to nasal cannula oxygen Cardiovascular status: blood pressure returned to baseline and stable Postop Assessment: no apparent nausea or vomiting Anesthetic complications: no     Last Vitals:  Vitals:   07/19/18 1530 07/19/18 1600  BP: (!) 152/76 (!) 149/78  Pulse: (!) 50 (!) 50  Resp: 18 20  Temp:    SpO2: 97% 97%    Last Pain:  Vitals:   07/19/18 1600  TempSrc:   PainSc: 3                  Keiarah Orlowski S

## 2018-07-19 NOTE — Anesthesia Post-op Follow-up Note (Signed)
Anesthesia QCDR form completed.        

## 2018-07-19 NOTE — Anesthesia Procedure Notes (Signed)
Procedure Name: Intubation Performed by: Lance Muss, CRNA Pre-anesthesia Checklist: Patient identified, Patient being monitored, Timeout performed, Emergency Drugs available and Suction available Patient Re-evaluated:Patient Re-evaluated prior to induction Oxygen Delivery Method: Circle system utilized Preoxygenation: Pre-oxygenation with 100% oxygen Induction Type: IV induction Ventilation: Mask ventilation without difficulty, Oral airway inserted - appropriate to patient size and Two handed mask ventilation required Laryngoscope Size: Mac and 4 Grade View: Grade II Tube type: Oral Tube size: 7.5 mm Number of attempts: 2 Airway Equipment and Method: Stylet Placement Confirmation: ETT inserted through vocal cords under direct vision,  positive ETCO2 and breath sounds checked- equal and bilateral Secured at: 22 cm Tube secured with: Tape Dental Injury: Teeth and Oropharynx as per pre-operative assessment  Future Recommendations: Recommend- induction with short-acting agent, and alternative techniques readily available

## 2018-07-20 ENCOUNTER — Encounter: Payer: Self-pay | Admitting: Vascular Surgery

## 2018-07-26 ENCOUNTER — Encounter: Payer: BLUE CROSS/BLUE SHIELD | Admitting: Family Medicine

## 2018-07-26 DIAGNOSIS — E11622 Type 2 diabetes mellitus with other skin ulcer: Secondary | ICD-10-CM | POA: Diagnosis not present

## 2018-07-31 NOTE — Progress Notes (Signed)
ARCHER, MOIST ([id number]) Visit Report for 07/26/2018 Chief Complaint Document Details Patient Name: [name] [name] A. Date of Service: 07/26/2018 8:30 AM Medical Record Number: [id number] Patient Account Number: [id number] Date of Birth/Sex: 1971-01-04 (47 y.o. M) Treating [name]: Cornell Barman Primary Care Provider: Gaetano Net Other Clinician: Referring Provider: Gaetano Net Treating Provider/Extender: Beather Arbour Weeks in Treatment: 30 Information Obtained from: Patient Chief Complaint Left Lateral posterior leg wound and lymphoedema Electronic Signature(s) [name]: 07/30/2018 5:29:42 PM By: Beather Arbour FNP-C Entered By: Beather Arbour on 07/26/2018 08:56:36 [place], Tyndall. ([id number]) -------------------------------------------------------------------------------- HPI Details Patient Name: [name] [name] A. Date of Service: 07/26/2018 8:30 AM Medical Record Number: [id number] Patient Account Number: [id number] Date of Birth/Sex: 1970-09-01 (47 y.o. M) Treating [name]: Cornell Barman Primary Care Provider: Gaetano Net Other Clinician: Referring Provider: Gaetano Net Treating Provider/Extender: Oneida Arenas in Treatment: 30 History of Present Illness HPI Description: 12/28/17; this is a now 47 year old man who is a type II diabetic. He was hospitalized from 10/01/17 through 10/19/17. He had an MSSA soft tissue and skin infection. 2 open areas on the left leg were identified he has a smaller area on the left medial calf superiorly just below the knee and a wound just above the left ankle on the posterior medial aspect. I think both of these were surgical IandD sites when he was in the hospital. He was discharged with a wound VAC at that point however this is since been taken off. He follows with Dr. Ola Spurr for the [facility] and he is still on chronic Keflex at 500 twice a day. At that time he was hospitalized his hemoglobin A1c was 15.1 however if I'm reading his endocrinologist  notes correctly that is improved. He has been following with Dr. [name] at vein and vascular and he has been applying calcium alginate and Unna boots. He has home health changing the dressing. They have also been attempting to get him external compression pumps although the patient is unaware whether they've been approved by insurance at this point. as mentioned he has a smaller clean wound on the right lateral calf just below the knee and he has a much larger area just above the left ankle medially and posteriorly. Our intake nurse reported greenish purulent looking drainage.the patient did have surgical material sent to pathology in February. This showed chronic abscess The patient also has lymphedema stage III in the left greater than right lower extremities. He has a history of blisters with wounds but these of all were always healed. The patient thinks that the lymphedema may have been present since he was about 47 years old i.e. about 30 years. He does not have graded pressure stockings and has not worn stockings. He does not have a distant history of DVT PE or phlebitis. He has not been systemically unwell fever no chills. He states that his Lasix is recently been reduced. He tells me his kidney function is at "30%" and he has been followed by Dr. [name] of nephrology. At one point he was on Lasix 80 twice a day however that's been cut back and he is now on Lasix at 20 twice a day. The patient has a history of PAD listed in his records although he comes from Dr. [name] I don't think is felt to have significant PAD. ABIs in our clinic were noncompressible bilaterally. 01/04/18; patient has a large wound on the left lateral lower calf and a small wound on the left medial upper calf. He has been to see  his nephrologist who changed him to Demadex 40 mg a day. I'm hopeful this will help with his systemic fluid overload. He also has stage III lymphedema. Really no change in the 2 wounds since last  week 01/11/18; the patient is down 13 pounds. He put his stage III lymphedema left leg in 4K compression last week and there is less edema fluid however we still haven't been able to communicate with home health but apparently it is [facility] but the dressings have not been changed. The patient noted an odor last week. He is also had compression pumps ordered by [facility] this as a not completed the paperwork stage. 01/18/18; patient continues to lose weight. Stage III lymphedema in the left greater than right leg under for alert compression. The major wound is on the left lateral ankle area. He apparently has bilateral compression pumps being brought to his house, these were ordered by [facility] Notable for the fact today he had some blisters on the right anterior leg together with some skin nodules. This is no doubt secondary to severe lymphedema. 01/25/18; the patient has obtained his compression pumps and is using them per vein and vascular instructions 3 times a day for an hour. He also saw [name] of vein and vascular. He was felt to have venous insufficiency but did not suggest any intervention also improved edema. It was suggested that he have compression stockings 20-30 mm on a daily basis in addition to compression pumps. The patient arrives in clinic today with a layer of unna under for layer compression. he seems to have some trouble with the degree of compression. He has open areas on the left lateral ankle area which is his major wound left upper medial calf and a superficial open area on the right anterior shin area which was blistered last week. He has skin changes on the right anterior [name] [name] A. ([id number]) calf which I think are no doubt secondary to lymphedema skin nodules etc. 02/01/18; the patient comes in telling us his nephrologist have to his torsemide. Unfortunately today he is put on 7 pounds by our scales. He has blisters all over the  anterior and medial part of his right calf and a new open wound. He also has soupy green drainage coming out of the left lateral calf /ankle wound. 02/08/18; culture I did last week of the left lateral ankle wound grew both Pseudomonas and Morganella. He is on Keflex from Dr. Ola Spurr in the hospital. I will need to review these notes.in any case Keflex is not going to cover these 2 organisms. I'm probably going to added ciprofloxacin today for 1 week. A lot of drainage that looks purulent last week. He is not complaining of pain however he has managed to put on 10 pounds in 2 weeks by our scales in this clinic. He is going to see his nephrologist tomorrow 02/15/18; he completed the ciprofloxacin I gave him last week. Notable that he is up to 379 pounds today which is up 16 pounds from 2 weeks ago. He is complaining of orthopnea but doesn't have any chest pain. 02/22/18; he continues to have weight gain. R intake nurse reports again purulent green drainage coming out of the lateral wound on the lateral left calf. He has small open area on the right anterior leg. His torsemide was increased to 2 tablets a day I believe this is 40 mg last week in response to the call admitted to Dr. Keturah Barre office.  He follows up with Dr. [name] and Dr. Delana Meyer tomorrow 03/01/18 his weight essentially stable today at 383 pounds. Drainage out of the left lateral wound on the lower left calf/ankle is a lot less. Culture last time grew Pseudomonas. I put him on cefdinir. He has been to Dr. Keturah Barre office no adjustments in his diuretics. Dr. Nicoletta Dress prescribed a wraparound stocking for the right leg.there is no open area on the right leg. He has a superficial area on the left medial calf, left posterior calf and in the large area on the left lateral however this looks better 03/15/18; weight is not up to 393 pounds. He saw his nephrologist yesterday Dr. [name] will increase the Demadex I'm hopeful this will help with the edema  control. I'm using silver alginate to all his wounds. In particular the left lateral ankle looks better. oUnfortunately he has new open areas on the right lateral calf that will include use of his compression stocking at least in the short to medium term. He has new wounds o3 on the right lateral calf. One of these has some size however all numerous superficial 03/22/18; his weight is stabilized a bit. Just adjustment of his diuretics by his nephrologist. There is no doubt he has some degree of systemic fluid overload on top of severe left greater than right lymphedema. 2 weeks ago tried to transition him to stockings on the right leg however he developed re-breakdown of skin on the right leg and we had to put him back in compression last week. He also uses external compression pumps and claims to be compliant Continued concern about his depression today 03/29/18; several ongoing issues with this patient; oHe no longer has home health coverage apparently secondary to a lapse in insurance. He is apparently transitioning from short for long-term disability. [facility] was changing his compression wraps on Monday and Friday. oHe has gained 10 pounds since last week oSees Dr. [name] tomorrow oSaw his primary doctor last week about the depression. It sounds as though he declined pharmacologic management. He seems somewhat better today. We were concerned last week when he came. Somewhat better today oHe is using his compression pumps once a day at home, I have asked for twice a day if possible especially on the left leg oFollows up with his nephrologist in mid-August. He is managing his diuretic for I think stage IV chronic renal failure oParadoxically his wounds actually look better 04/19/18; the patient has not been seen since I last saw him 3 weeks ago. He saw Dr. Delana Meyer of vascular surgery on 03/30/18 I believe he put him in a 20/30 stocking bilaterally with a wraparound extremitease stocking. He  has not been putting anything specifically on the wound. More problematic than that he has not been wearing the stockings he is at home. He has been using his external compression pumps once per day according to him on a rare occasion twice On a psychosocial level the patient is now on long-term disability and is applying for COBRA therefore he is between insurances. He has not been able to follow up with Dr. [name] who is his nephrologist as a result. As noted his weight is up to 407 pounds today. He promises me he'll follow-up with Dr. [name] 05/03/18; he hasn't been here in 2 weeks now. He apparently has been wearing a compression sock on the left leg. Massive increase in edema 3 large open wounds on the left anterior leg that were probably blisters. Significant deterioration in  the left lateral ankle wound that we've been doing as his most problematic wound. He still does not have his insurance issues wrapped up. His weight is well over 400 pounds. 05/10/18; arrives today with better looking edema control in the left leg. He has been using his compression pumps twice a day. We also wrapped it is left leg for there he's been using his pumps so there is much better edema control. Most of new wounds from last week look a lot better. Even the refractory area on the lower left lateral ankle looks a lot better to me today. He follows up with Dr. [name] this morning [nephrology] 05/17/18 patient arrives today with a lot less edema in the left leg. His weight is gone down 7 pounds. He tells me he saw Dr. [name] but his torsemide was not adjusted. He is using the palms 3 times a day. There is been quite an improvement in the Perman, Joyce A. ([id number]) remaining wound on the left lateral ankle oWeekly visit for follow-up of bilateral lower extremity wounds related to severe lymphedema and probably some degree of systemic fluid overload from chronic renal failure stage IV. His weight is up this week to over 400  pounds. He noted increasing edema in the right leg late last week. He took his compression stocking off he did not increase the frequency of this compression pump use. He developed a large blister on the back of the right calf. He also has a new opened blister on the left lateral calf in addition to the wound that we've been using on the distal left lower calf area and we've been using silver alginate. He tells me that he has an ultrasound which is a DVT rule out and I think a reflux study ordered by Dr. [name] 05/31/18; weekly visit. He went to see vein and vascular this week apparently they remove the 4-layer compression on the left and put an Unna boot on him in replacement. He's got more swelling in the left leg is resolved. That being said his left lower Wound is better. He still has a fairly large wound on the right posterior calf this was not disturbed. He has a follow-up appointment with Dr. Keturah Barre nephrologist next week 06/07/2018; the patient arrives today with a history that he took both his compression wraps off 3 days ago in order to take a shower. He has bilateral severe tense blisters. A lot of weeping erythema especially on the lateral left calf. Almost circumferential blisters on the right. Multiple areas of epithelial breakdown. He does not complain of any pain fever chills. He states he has been using his compression pump in some form of stocking that I could not really determine the type. He did not come into the clinic with anything on his legs. He tells me he has an appointment with Dr. Merita Norton his nephrologist I believe this Friday. Paradoxically his weight is actually down to 385 pounds I believe last week he was over 400 06/14/18; arrives with better looking wound surfaces today on both legs. There is no further blistering however there are still areas that aren't epithelialized on the right anterior, right lateral, left posterior and left medial. His original wound just  above the left ankle laterally is still open moist. His weight was about the same today. He tells me that his nephrologist increase the torsemide from 40/20/09/1939/40 twice a day 06/21/18; both his wraps fell down to his mid calf. Has a result he has multiple blisters  across the anterior right leg above with the wraps ultimately watched. He also has blisters on the posterior part of the left calf o2. His original wound on the left lateral calf is hard to see any open area. There is however a divot. Which is going to be difficult to deal with into the future until the edema in the left leg is controlled. 2 small superficial areas remain He tells [place] his weight was 386 pounds on his scale at home. According to our scale he is up 5 pounds. He is not on a fluid restriction 06/28/18; patient's weight is gone up 2 pounds since last time. He has weeping edema and open superficial wounds on the right posterior right lateral and right anterior calf. He has a small open area on the left anterior probably left posterior calf and the original wound on the left lateral calf appears to be just about closed He is being planned for a dialysis shunt in the right arm through [facility] incoordination with his nephrologist Dr. [name] He claims to be using his compression pumps twice a day. He has lymphedema and no doubt systemic fluid volume overload from stage IV chronic renal failure 07/04/18; patient's weight is up into the 396 range. He is supposed to see his cardiologist tomorrow and plans are being made for a shunt by Dr. Delana Meyer. He still has significant open wounds/draining areas on the right anterior and right posterior calf. Several small open areas on the left which are less in terms of wound area on the right which is surprising given the fact the left is the larger most lymphedematous leg 07/26/2018 She is seen today for follow-up and management of lateral posterior lower leg wound and bilateral  lymphoedema. He has a very flat affect and depressed mood today. Unable to elicit much information from him today. No thoughts of harm to self identified. Recently had a follow-up with vascular vein for fistula placement to initiate dialysis in the right arm. He has 2 dressings on the right arm from the fistula procedure with no bleeding or drainage noted on the dressing. He does have a large amount of bruising in the surrounding to puncture areas on the right lower arm from the fistula placement. No pain elicited with palpation of the right arm. He denies a diminished sensation in that right lower arm as well. He does have a small wound to the left lateral posterior lower leg. No visual drainage present to either leg. However the wound to the left lower leg does have a foul odor even after cleaning it. The left is the larger most lymphedematous leg with the right Leg still having a significant amount of edema. No drainage or blisters present on either leg today. He states that he is using the lymphedema pumps twice a day. Not too sure if he is compliant with actually using the lymphedema pumps based on the amount of edema that he has in his legs. Weight increased from 396.1 to 401.6. No recent fevers, chills, or shortness of breath. Electronic Signature(s) [name]: 07/30/2018 5:29:42 PM By: Beather Arbour FNP-C [place], [place]. ([id number]) Entered By: Beather Arbour on 07/26/2018 09:22:59 Suttles, Vraj AMarland Kitchen ([id number]) -------------------------------------------------------------------------------- Physical Exam Details Patient Name: Nidiffer, Teigen A. Date of Service: 07/26/2018 8:30 AM Medical Record Number: [id number] Patient Account Number: [id number] Date of Birth/Sex: February 26, 1971 (47 y.o. M) Treating [name]: Cornell Barman Primary Care Provider: Gaetano Net Other Clinician: Referring Provider: Gaetano Net Treating Provider/Extender: Beather Arbour Weeks in Treatment:  51 Constitutional Patient  is morbidly obese.[name] appears in no distress. Respiratory Respiratory effort is easy and symmetric bilaterally. Rate is normal at rest and on room air.. Cardiovascular +1 Pedal pulses palpable and strong bilaterally.. Edema present in both extremities.. Musculoskeletal Gait and station stable.. Integumentary (Hair, Skin) lymphoedema. small posterior left leg wound with odor. Psychiatric Alert and oriented times 3.. Patient appears depressed today.. Notes /27/2019: Wound exam: Left lateral posterior lower leg small wound present near the 1:00 area of the crease. Wound has an odor after cleaning without any drainage present from the wound. Continues to have dry flaky skin to bilateral lower extremities. No blisters actually present during exam today. Will add calcium alginate over the wound and to continue the 4 layer wraps. With a follow-up in 1 week to assess the wound area on the left lower extremity. Electronic Signature(s) [name]: 07/30/2018 5:29:42 PM By: Beather Arbour FNP-C Entered By: Beather Arbour on 07/26/2018 09:13:28 Ciolino, Fairley AMarland Kitchen ([id number]) -------------------------------------------------------------------------------- Physician Orders Details Patient Name: Anaya, Ricard A. Date of Service: 07/26/2018 8:30 AM Medical Record Number: [id number] Patient Account Number: [id number] Date of Birth/Sex: 1971-05-13 (46 y.o. M) Treating [name]: Cornell Barman Primary Care Provider: Gaetano Net Other Clinician: Referring Provider: Gaetano Net Treating Provider/Extender: Oneida Arenas in Treatment: 30 Verbal / Phone Orders: No Diagnosis Coding ICD-10 Coding Code Description [phone] Non-pressure chronic ulcer of left calf with necrosis of muscle I89.0 Lymphedema, not elsewhere classified I87.332 Chronic venous hypertension (idiopathic) with ulcer and inflammation of left lower extremity E11.622 Type 2 diabetes mellitus with other skin ulcer L97.211 Non-pressure chronic  ulcer of right calf limited to breakdown of skin Wound Cleansing Wound #2 Left,Lateral,Posterior Lower Leg o Clean wound with Normal Saline. o Cleanse wound with mild soap and water Primary Wound Dressing Wound #2 Left,Lateral,Posterior Lower Leg o Silver Alginate - on all draining areas Secondary Dressing Wound #2 Left,Lateral,Posterior Lower Leg o Other - absorbent dressing on all weeping areas Dressing Change Frequency Wound #2 Left,Lateral,Posterior Lower Leg o Change Dressing Monday, Wednesday, Friday - Wednesdays in [facility] Follow-up Appointments Wound #2 Left,Lateral,Posterior Lower Leg o Return Appointment in 1 week. Edema Control Wound #2 Left,Lateral,Posterior Lower Leg o 4 Layer Compression System - Bilateral o Compression Pump: Use compression pump on left lower extremity for 30 minutes, twice daily. - 1 Hour three times Daily o Compression Pump: Use compression pump on right lower extremity for 30 minutes, twice daily. - 1 Hour three times daily Home Health Wound #2 [place] Visits - [facility]: Monday, Wednesday and Friday. GRANT, SWAGER ([id number]) o Home Health Nurse may visit PRN to address patientos wound care needs. o FACE TO FACE ENCOUNTER: MEDICARE and MEDICAID PATIENTS: I certify that this patient is under my care and that I had a face-to-face encounter that meets the physician face-to-face encounter requirements with this patient on this date. The encounter with the patient was in whole or in part for the following MEDICAL CONDITION: (primary reason for [place]) MEDICAL NECESSITY: I certify, that based on my findings, NURSING services are a medically necessary home health service. HOME BOUND STATUS: I certify that my clinical findings support that this patient is homebound (i.e., Due to illness or injury, pt requires aid of supportive devices such as crutches, cane,  wheelchairs, walkers, the use of special transportation or the assistance of another person to leave their place of residence. There is a normal inability to leave the home and doing so requires  considerable and taxing effort. Other absences are for medical reasons / religious services and are infrequent or of short duration when for other reasons). o If current dressing causes regression in wound condition, may D/C ordered dressing product/s and apply Normal Saline Moist Dressing daily until next [place] / Other MD appointment. [place] of regression in wound condition at [phone]. o Please direct any NON-WOUND related issues/requests for orders to patient's Primary Care Physician Electronic Signature(s) [name]: 07/30/2018 5:29:42 PM By: Beather Arbour FNP-C Entered By: Beather Arbour on 07/26/2018 09:16:09 Allcorn, Wallie A. ([id number]) -------------------------------------------------------------------------------- Problem List Details Patient Name: Teo, Makail A. Date of Service: 07/26/2018 8:30 AM Medical Record Number: [id number] Patient Account Number: [id number] Date of Birth/Sex: 1971-03-07 (47 y.o. M) Treating [name]: Cornell Barman Primary Care Provider: Gaetano Net Other Clinician: Referring Provider: Gaetano Net Treating Provider/Extender: Beather Arbour Weeks in Treatment: 30 Active Problems ICD-10 Evaluated Encounter Code Description Active Date Today Diagnosis L97.223 Non-pressure chronic ulcer of left calf with necrosis of muscle 12/28/2017 No Yes I89.0 Lymphedema, not elsewhere classified 12/28/2017 No Yes I87.332 Chronic venous hypertension (idiopathic) with ulcer and 12/28/2017 No Yes inflammation of left lower extremity E11.622 Type 2 diabetes mellitus with other skin ulcer 12/28/2017 No Yes L97.211 Non-pressure chronic ulcer of right calf limited to breakdown 02/22/2018 No Yes of skin Inactive Problems Resolved Problems Electronic  Signature(s) [name]: 07/30/2018 5:29:42 PM By: Beather Arbour FNP-C Entered By: Beather Arbour on 07/26/2018 08:51:02 [name] [name] A. ([id number]) -------------------------------------------------------------------------------- Progress Note Details Patient Name: [name] [name] A. Date of Service: 07/26/2018 8:30 AM Medical Record Number: [id number] Patient Account Number: [id number] Date of Birth/Sex: 1970/10/18 (47 y.o. M) Treating [name]: Cornell Barman Primary Care Provider: Gaetano Net Other Clinician: Referring Provider: Gaetano Net Treating Provider/Extender: Oneida Arenas in Treatment: 30 Subjective Chief Complaint Information obtained from Patient Left Lateral posterior leg wound and lymphoedema History of Present Illness (HPI) 12/28/17; this is a now 47 year old man who is a type II diabetic. He was hospitalized from 10/01/17 through 10/19/17. He had an MSSA soft tissue and skin infection. 2 open areas on the left leg were identified he has a smaller area on the left medial calf superiorly just below the knee and a wound just above the left ankle on the posterior medial aspect. I think both of these were surgical IandD sites when he was in the hospital. He was discharged with a wound VAC at that point however this is since been taken off. He follows with Dr. Ola Spurr for the [facility] and he is still on chronic Keflex at 500 twice a day. At that time he was hospitalized his hemoglobin A1c was 15.1 however if I'm reading his endocrinologist notes correctly that is improved. He has been following with Dr. [name] at vein and vascular and he has been applying calcium alginate and Unna boots. He has home health changing the dressing. They have also been attempting to get him external compression pumps although the patient is unaware whether they've been approved by insurance at this point. as mentioned he has a smaller clean wound on the right lateral calf just below the knee and he has a  much larger area just above the left ankle medially and posteriorly. Our intake nurse reported greenish purulent looking drainage.the patient did have surgical material sent to pathology in February. This showed chronic abscess The patient also has lymphedema stage III in the left greater than right lower extremities. He has a history of blisters with wounds but these  of all were always healed. The patient thinks that the lymphedema may have been present since he was about 47 years old i.e. about 30 years. He does not have graded pressure stockings and has not worn stockings. He does not have a distant history of DVT PE or phlebitis. He has not been systemically unwell fever no chills. He states that his Lasix is recently been reduced. He tells me his kidney function is at "30%" and he has been followed by Dr. [name] of nephrology. At one point he was on Lasix 80 twice a day however that's been cut back and he is now on Lasix at 20 twice a day. The patient has a history of PAD listed in his records although he comes from Dr. [name] I don't think is felt to have significant PAD. ABIs in our clinic were noncompressible bilaterally. 01/04/18; patient has a large wound on the left lateral lower calf and a small wound on the left medial upper calf. He has been to see his nephrologist who changed him to Demadex 40 mg a day. I'm hopeful this will help with his systemic fluid overload. He also has stage III lymphedema. Really no change in the 2 wounds since last week 01/11/18; the patient is down 13 pounds. He put his stage III lymphedema left leg in 4K compression last week and there is less edema fluid however we still haven't been able to communicate with home health but apparently it is [facility] but the dressings have not been changed. The patient noted an odor last week. He is also had compression pumps ordered by [facility] this as a not completed the paperwork stage. 01/18/18; patient  continues to lose weight. Stage III lymphedema in the left greater than right leg under for alert compression. The major wound is on the left lateral ankle area. He apparently has bilateral compression pumps being brought to his house, these were ordered by [facility] Notable for the fact today he had some blisters on the right anterior leg together with some skin nodules. This is no doubt secondary to severe lymphedema. 01/25/18; the patient has obtained his compression pumps and is using them per vein and vascular instructions 3 times a day for an hour. He also saw [name] of vein and vascular. He was felt to have venous insufficiency but did not suggest any Duffner, Tasman A. ([id number]) intervention also improved edema. It was suggested that he have compression stockings 20-30 mm on a daily basis in addition to compression pumps. The patient arrives in clinic today with a layer of unna under for layer compression. he seems to have some trouble with the degree of compression. He has open areas on the left lateral ankle area which is his major wound left upper medial calf and a superficial open area on the right anterior shin area which was blistered last week. He has skin changes on the right anterior calf which I think are no doubt secondary to lymphedema skin nodules etc. 02/01/18; the patient comes in telling us his nephrologist have to his torsemide. Unfortunately today he is put on 7 pounds by our scales. He has blisters all over the anterior and medial part of his right calf and a new open wound. He also has soupy green drainage coming out of the left lateral calf /ankle wound. 02/08/18; culture I did last week of the left lateral ankle wound grew both Pseudomonas and Morganella. He is on Keflex from Dr. Ola Spurr in  the hospital. I will need to review these notes.in any case Keflex is not going to cover these 2 organisms. I'm probably going to added ciprofloxacin today for 1  week. A lot of drainage that looks purulent last week. He is not complaining of pain however he has managed to put on 10 pounds in 2 weeks by our scales in this clinic. He is going to see his nephrologist tomorrow 02/15/18; he completed the ciprofloxacin I gave him last week. Notable that he is up to 379 pounds today which is up 16 pounds from 2 weeks ago. He is complaining of orthopnea but doesn't have any chest pain. 02/22/18; he continues to have weight gain. R intake nurse reports again purulent green drainage coming out of the lateral wound on the lateral left calf. He has small open area on the right anterior leg. His torsemide was increased to 2 tablets a day I believe this is 40 mg last week in response to the call admitted to Dr. Keturah Barre office. He follows up with Dr. [name] and Dr. Delana Meyer tomorrow 03/01/18 his weight essentially stable today at 383 pounds. Drainage out of the left lateral wound on the lower left calf/ankle is a lot less. Culture last time grew Pseudomonas. I put him on cefdinir. He has been to Dr. Keturah Barre office no adjustments in his diuretics. Dr. Nicoletta Dress prescribed a wraparound stocking for the right leg.there is no open area on the right leg. He has a superficial area on the left medial calf, left posterior calf and in the large area on the left lateral however this looks better 03/15/18; weight is not up to 393 pounds. He saw his nephrologist yesterday Dr. [name] will increase the Demadex I'm hopeful this will help with the edema control. I'm using silver alginate to all his wounds. In particular the left lateral ankle looks better. Unfortunately he has new open areas on the right lateral calf that will include use of his compression stocking at least in the short to medium term. He has new wounds o3 on the right lateral calf. One of these has some size however all numerous superficial 03/22/18; his weight is stabilized a bit. Just adjustment of his diuretics by his  nephrologist. There is no doubt he has some degree of systemic fluid overload on top of severe left greater than right lymphedema. 2 weeks ago tried to transition him to stockings on the right leg however he developed re-breakdown of skin on the right leg and we had to put him back in compression last week. He also uses external compression pumps and claims to be compliant Continued concern about his depression today 03/29/18; several ongoing issues with this patient; He no longer has home health coverage apparently secondary to a lapse in insurance. He is apparently transitioning from short for long-term disability. [facility] was changing his compression wraps on Monday and Friday. He has gained 10 pounds since last week Sees Dr. [name] tomorrow Saw his primary doctor last week about the depression. It sounds as though he declined pharmacologic management. He seems somewhat better today. We were concerned last week when he came. Somewhat better today He is using his compression pumps once a day at home, I have asked for twice a day if possible especially on the left leg Follows up with his nephrologist in mid-August. He is managing his diuretic for I think stage IV chronic renal failure Paradoxically his wounds actually look better 04/19/18; the patient has not been seen since  I last saw him 3 weeks ago. He saw Dr. Delana Meyer of vascular surgery on 03/30/18 I believe he put him in a 20/30 stocking bilaterally with a wraparound extremitease stocking. He has not been putting anything specifically on the wound. More problematic than that he has not been wearing the stockings he is at home. He has been using his external compression pumps once per day according to him on a rare occasion twice On a psychosocial level the patient is now on long-term disability and is applying for COBRA therefore he is between insurances. He has not been able to follow up with Dr. [name] who is his nephrologist as a  result. As noted his weight is up to 407 pounds today. He promises me he'll follow-up with Dr. [name] 05/03/18; he hasn't been here in 2 weeks now. He apparently has been wearing a compression sock on the left leg. Massive increase in edema 3 large open wounds on the left anterior leg that were probably blisters. Significant deterioration in the left lateral ankle wound that we've been doing as his most problematic wound. He still does not have his insurance issues wrapped up. His weight is well over 400 pounds. Travieso, Adams A. ([id number]) 05/10/18; arrives today with better looking edema control in the left leg. He has been using his compression pumps twice a day. We also wrapped it is left leg for there he's been using his pumps so there is much better edema control. Most of new wounds from last week look a lot better. Even the refractory area on the lower left lateral ankle looks a lot better to me today. He follows up with Dr. [name] this morning [nephrology] 05/17/18 patient arrives today with a lot less edema in the left leg. His weight is gone down 7 pounds. He tells me he saw Dr. [name] but his torsemide was not adjusted. He is using the palms 3 times a day. There is been quite an improvement in the remaining wound on the left lateral ankle Weekly visit for follow-up of bilateral lower extremity wounds related to severe lymphedema and probably some degree of systemic fluid overload from chronic renal failure stage IV. His weight is up this week to over 400 pounds. He noted increasing edema in the right leg late last week. He took his compression stocking off he did not increase the frequency of this compression pump use. He developed a large blister on the back of the right calf. He also has a new opened blister on the left lateral calf in addition to the wound that we've been using on the distal left lower calf area and we've been using silver alginate. He tells me that he has an ultrasound  which is a DVT rule out and I think a reflux study ordered by Dr. [name] 05/31/18; weekly visit. He went to see vein and vascular this week apparently they remove the 4-layer compression on the left and put an Unna boot on him in replacement. He's got more swelling in the left leg is resolved. That being said his left lower Wound is better. He still has a fairly large wound on the right posterior calf this was not disturbed. He has a follow-up appointment with Dr. Keturah Barre nephrologist next week 06/07/2018; the patient arrives today with a history that he took both his compression wraps off 3 days ago in order to take a shower. He has bilateral severe tense blisters. A lot of weeping erythema especially on the lateral left calf.  Almost circumferential blisters on the right. Multiple areas of epithelial breakdown. He does not complain of any pain fever chills. He states he has been using his compression pump in some form of stocking that I could not really determine the type. He did not come into the clinic with anything on his legs. He tells me he has an appointment with Dr. Merita Norton his nephrologist I believe this Friday. Paradoxically his weight is actually down to 385 pounds I believe last week he was over 400 06/14/18; arrives with better looking wound surfaces today on both legs. There is no further blistering however there are still areas that aren't epithelialized on the right anterior, right lateral, left posterior and left medial. His original wound just above the left ankle laterally is still open moist. His weight was about the same today. He tells me that his nephrologist increase the torsemide from 40/20/09/1939/40 twice a day 06/21/18; both his wraps fell down to his mid calf. Has a result he has multiple blisters across the anterior right leg above with the wraps ultimately watched. He also has blisters on the posterior part of the left calf o2. His original wound on the left lateral calf  is hard to see any open area. There is however a divot. Which is going to be difficult to deal with into the future until the edema in the left leg is controlled. 2 small superficial areas remain He tells [place] his weight was 386 pounds on his scale at home. According to our scale he is up 5 pounds. He is not on a fluid restriction 06/28/18; patient's weight is gone up 2 pounds since last time. He has weeping edema and open superficial wounds on the right posterior right lateral and right anterior calf. He has a small open area on the left anterior probably left posterior calf and the original wound on the left lateral calf appears to be just about closed He is being planned for a dialysis shunt in the right arm through [facility] incoordination with his nephrologist Dr. [name] He claims to be using his compression pumps twice a day. He has lymphedema and no doubt systemic fluid volume overload from stage IV chronic renal failure 07/04/18; patient's weight is up into the 396 range. He is supposed to see his cardiologist tomorrow and plans are being made for a shunt by Dr. Delana Meyer. He still has significant open wounds/draining areas on the right anterior and right posterior calf. Several small open areas on the left which are less in terms of wound area on the right which is surprising given the fact the left is the larger most lymphedematous leg 07/26/2018 She is seen today for follow-up and management of lateral posterior lower leg wound and bilateral lymphoedema. He has a very flat affect and depressed mood today. Unable to elicit much information from him today. No thoughts of harm to self identified. Recently had a follow-up with vascular vein for fistula placement to initiate dialysis in the right arm. He has 2 dressings on the right arm from the fistula procedure with no bleeding or drainage noted on the dressing. He does have a large amount of bruising in the surrounding to puncture areas  on the right lower arm from the fistula placement. No pain elicited with palpation of the right arm. He denies a diminished sensation in that right lower arm as well. He does have a small wound to the left lateral posterior lower leg. No visual drainage present to either  leg. However the wound to the left lower leg does have a foul odor even after cleaning it. The left is the larger most lymphedematous leg with the right Leg still having a significant amount of edema. No drainage or blisters present on either leg today. He states that he is using the lymphedema pumps twice a day. Not too sure if he is compliant with actually using the lymphedema pumps based on the amount of edema [name] [name] A. ([id number]) that he has in his legs. No recent fevers, chills, or shortness of breath. Patient History Information obtained from Patient. Family History Cancer - Maternal Grandparents, Diabetes - Father,Child, Hypertension - Paternal Grandparents, Kidney Disease - Father, Lung Disease - Maternal Grandparents, No family history of Heart Disease, Hereditary Spherocytosis, Seizures, Stroke, Thyroid Problems, Tuberculosis. Social History Never smoker, Marital Status - Separated, Alcohol Use - Rarely, Drug Use - No History, Caffeine Use - Rarely. Review of Systems (ROS) Constitutional Symptoms (General Health) Complains or has symptoms of Marked Weight Change - current weight 401.6. Respiratory The patient has no complaints or symptoms. Cardiovascular The patient has no complaints or symptoms. Integumentary (Skin) Complains or has symptoms of Wounds - left posterior lower leg, Swelling - bilateral lower extremities. Psychiatric depression Objective Constitutional Patient is morbidly obese.[name] appears in no distress. Vitals Time Taken: 8:20 AM, Height: 76 in, Weight: 401.6 lbs, BMI: 48.9, Temperature: 97.8 F, Pulse: 62 bpm, Respiratory Rate: 18 breaths/min, Blood Pressure: 167/75  mmHg. Respiratory Respiratory effort is easy and symmetric bilaterally. Rate is normal at rest and on room air.. Cardiovascular +1 Pedal pulses palpable and strong bilaterally.. Edema present in both extremities.. Musculoskeletal Gait and station stable.[name] Psychiatric Alert and oriented times 3.. Patient appears depressed today.. General Notes: /27/2019: Wound exam: Left lateral posterior lower leg small wound present near the 1:00 area of the crease. Wound has an odor after cleaning without any drainage present from the wound. Continues to have dry flaky skin to bilateral lower extremities. No blisters actually present during exam today. Will add calcium alginate over the wound and to continue [name] [name] A. ([id number]) the 4 layer wraps. With a follow-up in 1 week to assess the wound area on the left lower extremity. Integumentary (Hair, Skin) lymphoedema. small posterior left leg wound with odor. Wound #2 status is Open. Original cause of wound was Gradually Appeared. The wound is located on the Left,Lateral,Posterior Lower Leg. The wound measures 0.1cm length x 0.1cm width x 0.1cm depth; 0.008cm^2 area and 0.001cm^3 volume. Assessment Active Problems ICD-10 Non-pressure chronic ulcer of left calf with necrosis of muscle Lymphedema, not elsewhere classified Chronic venous hypertension (idiopathic) with ulcer and inflammation of left lower extremity Type 2 diabetes mellitus with other skin ulcer Non-pressure chronic ulcer of right calf limited to breakdown of skin Procedures Wound #2 Pre-procedure diagnosis of Wound #2 is a Diabetic Wound/Ulcer of the Lower Extremity located on the Left,Lateral,Posterior Lower Leg . There was a Four Layer Compression Therapy Procedure by Cornell Barman, [name] Post procedure Diagnosis Wound #2: Same as Pre-Procedure Notes: Patient tolerating 4 layer compression well.. Plan Wound Cleansing: Wound #2 Left,Lateral,Posterior Lower Leg: Clean wound with  Normal Saline. Cleanse wound with mild soap and water Primary Wound Dressing: Wound #2 Left,Lateral,Posterior Lower Leg: Silver Alginate - on all draining areas Secondary Dressing: Wound #2 Left,Lateral,Posterior Lower Leg: Other - absorbent dressing on all weeping areas Dressing Change Frequency: Wound #2 Left,Lateral,Posterior Lower Leg: Change Dressing Monday, Wednesday, Friday - Wednesdays in [facility] Follow-up Appointments: Wound #  2 Left,Lateral,Posterior Lower Leg: Return Appointment in 1 week. [name] [name] A. ([id number]) Edema Control: Wound #2 Left,Lateral,Posterior Lower Leg: 4 Layer Compression System - Bilateral Compression Pump: Use compression pump on left lower extremity for 30 minutes, twice daily. - 1 Hour three times Daily Compression Pump: Use compression pump on right lower extremity for 30 minutes, twice daily. - 1 Hour three times daily Home Health: Wound #2 Left,Lateral,Posterior Lower Leg: Continue Home Health Visits - [facility]: Monday, Wednesday and Friday. Home Health Nurse may visit PRN to address patient s wound care needs. FACE TO FACE ENCOUNTER: MEDICARE and MEDICAID PATIENTS: I certify that this patient is under my care and that I had a face-to-face encounter that meets the physician face-to-face encounter requirements with this patient on this date. The encounter with the patient was in whole or in part for the following MEDICAL CONDITION: (primary reason for [place]) MEDICAL NECESSITY: I certify, that based on my findings, NURSING services are a medically necessary home health service. HOME BOUND STATUS: I certify that my clinical findings support that this patient is homebound (i.e., Due to illness or injury, pt requires aid of supportive devices such as crutches, cane, wheelchairs, walkers, the use of special transportation or the assistance of another person to leave their place of residence. There is a normal inability to leave  the home and doing so requires considerable and taxing effort. Other absences are for medical reasons / religious services and are infrequent or of short duration when for other reasons). If current dressing causes regression in wound condition, may D/C ordered dressing product/s and apply Normal Saline Moist Dressing daily until next [place] / Other MD appointment. [place] of regression in wound condition at [phone]. Please direct any NON-WOUND related issues/requests for orders to patient's Primary Care Physician Electronic Signature(s) [name]: 07/30/2018 5:29:42 PM By: Beather Arbour FNP-C Entered By: Beather Arbour on 07/26/2018 09:16:47 [name] [name] A. ([id number]) -------------------------------------------------------------------------------- ROS/PFSH Details Patient Name: [name] [name] A. Date of Service: 07/26/2018 8:30 AM Medical Record Number: [id number] Patient Account Number: [id number] Date of Birth/Sex: 1971/02/10 (47 y.o. M) Treating [name]: Cornell Barman Primary Care Provider: Gaetano Net Other Clinician: Referring Provider: Gaetano Net Treating Provider/Extender: Oneida Arenas in Treatment: 30 Information Obtained From Patient Wound History Do you currently have one or more open woundso Yes How many open wounds do you currently haveo 2 Approximately how long have you had your woundso 2 months How have you been treating your wound(s) until nowo unna boot Has your wound(s) ever healed and then re-openedo No Have you had any lab work done in the past montho No Have you tested positive for an antibiotic resistant organism (MRSA, VRE)o No Have you tested positive for osteomyelitis (bone infection)o No Have you had any tests for circulation on your legso No Have you had other problems associated with your woundso Swelling Constitutional Symptoms (General Health) Complaints and Symptoms: Positive for: Marked Weight Change -  current weight 401.6 Integumentary (Skin) Complaints and Symptoms: Positive for: Wounds - left posterior lower leg; Swelling - bilateral lower extremities Respiratory Complaints and Symptoms: No Complaints or Symptoms Cardiovascular Complaints and Symptoms: No Complaints or Symptoms Medical History: Positive for: Hypertension Endocrine Medical History: Positive for: Type II Diabetes Time with diabetes: 15 yrs Treated with: Insulin, Oral agents Blood sugar tested every day: Yes Tested : Neurologic [name] [name] A. ([id number]) Medical History: Positive for: Neuropathy Psychiatric Complaints and Symptoms: Review of System Notes: depression Immunizations Pneumococcal Vaccine: Received  Pneumococcal Vaccination: Yes Implantable Devices Family and Social History Cancer: Yes - Maternal Grandparents; Diabetes: Yes - Father,Child; Heart Disease: No; Hereditary Spherocytosis: No; Hypertension: Yes - Paternal Grandparents; Kidney Disease: Yes - Father; Lung Disease: Yes - Maternal Grandparents; Seizures: No; Stroke: No; Thyroid Problems: No; Tuberculosis: No; Never smoker; Marital Status - Separated; Alcohol Use: Rarely; Drug Use: No History; Caffeine Use: Rarely; Financial Concerns: No; Food, Clothing or Shelter Needs: No; Support System Lacking: No; Transportation Concerns: No; Advanced Directives: No; Patient does not want information on Advanced Directives; Do not resuscitate: No; Living Will: No; Medical Power of Attorney: Yes - mother (Not Provided) Physician Affirmation I have reviewed and agree with the above information. Electronic Signature(s) [name]: 07/26/2018 4:25:08 PM By: Gretta Cool, BSN, [name], CWS, [name] [name] [name]: 07/30/2018 5:29:42 PM By: Beather Arbour FNP-C Entered By: Beather Arbour on 07/26/2018 09:07:34 [name] [name] A. ([id number]) -------------------------------------------------------------------------------- SuperBill Details Patient Name: Langer, Tierra A. Date  of Service: 07/26/2018 Medical Record Number: [id number] Patient Account Number: [id number] Date of Birth/Sex: 05-May-1971 (47 y.o. M) Treating [name]: Cornell Barman Primary Care Provider: Gaetano Net Other Clinician: Referring Provider: Gaetano Net Treating Provider/Extender: Beather Arbour Weeks in Treatment: 30 Diagnosis Coding ICD-10 Codes Code Description [id number] Non-pressure chronic ulcer of left calf with necrosis of muscle I89.0 Lymphedema, not elsewhere classified I87.332 Chronic venous hypertension (idiopathic) with ulcer and inflammation of left lower extremity E11.622 Type 2 diabetes mellitus with other skin ulcer L97.211 Non-pressure chronic ulcer of right calf limited to breakdown of skin Facility Procedures CPT4: Description Modifier Quantity Code [id number] BILATERAL: Application of multi-layer venous compression system; leg (below 1 knee), including ankle and foot. Physician Procedures CPT4: Description Modifier Quantity Code [id number] - WC PHYS LEVEL 3 - EST PT 1 ICD-10 Diagnosis Description I87.332 Chronic venous hypertension (idiopathic) with ulcer and inflammation of left lower extremity Electronic Signature(s) [name]: 07/30/2018 5:29:42 PM By: Beather Arbour FNP-C Entered By: Beather Arbour on 07/26/2018 09:20:40

## 2018-08-02 ENCOUNTER — Encounter: Payer: BLUE CROSS/BLUE SHIELD | Attending: Internal Medicine | Admitting: Internal Medicine

## 2018-08-02 DIAGNOSIS — L97223 Non-pressure chronic ulcer of left calf with necrosis of muscle: Secondary | ICD-10-CM | POA: Diagnosis not present

## 2018-08-02 DIAGNOSIS — I890 Lymphedema, not elsewhere classified: Secondary | ICD-10-CM | POA: Diagnosis not present

## 2018-08-02 DIAGNOSIS — L97211 Non-pressure chronic ulcer of right calf limited to breakdown of skin: Secondary | ICD-10-CM | POA: Diagnosis not present

## 2018-08-02 DIAGNOSIS — I10 Essential (primary) hypertension: Secondary | ICD-10-CM | POA: Insufficient documentation

## 2018-08-02 DIAGNOSIS — E11622 Type 2 diabetes mellitus with other skin ulcer: Secondary | ICD-10-CM | POA: Insufficient documentation

## 2018-08-03 ENCOUNTER — Encounter (INDEPENDENT_AMBULATORY_CARE_PROVIDER_SITE_OTHER): Payer: Self-pay | Admitting: Vascular Surgery

## 2018-08-03 ENCOUNTER — Ambulatory Visit (INDEPENDENT_AMBULATORY_CARE_PROVIDER_SITE_OTHER): Payer: BLUE CROSS/BLUE SHIELD | Admitting: Vascular Surgery

## 2018-08-03 VITALS — BP 161/79 | HR 60 | Resp 18 | Wt >= 6400 oz

## 2018-08-03 DIAGNOSIS — N184 Chronic kidney disease, stage 4 (severe): Secondary | ICD-10-CM

## 2018-08-03 NOTE — Progress Notes (Signed)
Patient ID: [name], male   DOB: October 31, 1970, 47 y.o.   MRN: [id number]  No chief complaint on file.   HPI Breaker [name] is a 47 y.o. male.    Procedure(s) Performed 07/19/2018:             1.  Placement Terumo 5 French glide sheath right ulnar vein with ultrasound guidance             2.  Placement Terumo 5 French glide sheath right ulnar artery with ultrasound guidance             3.  Right arm arteriography first-order catheter placement             4.  Right arm venography             5.  Creation of an ulnar ulnar fistula using radiofrequency with the Broadus system             6.  Coil embolization of the left brachial vein  He denies arm or hand pain.  No fever post op  Past Medical History:  Diagnosis Date  . Chronic kidney disease   . Diabetes mellitus without complication ([place])   . Hypertension   . Lymphedema of leg   . Microalbuminuria   . Sleep apnea   . Vitamin D deficiency     Past Surgical History:  Procedure Laterality Date  . APPLICATION OF WOUND VAC Left 10/03/2017   Procedure: APPLICATION OF WOUND VAC;  Surgeon: Algernon Huxley, MD;  Location: [facility];  Service: General;  Laterality: Left;  . APPLICATION OF WOUND VAC Left 10/11/2017   Procedure: APPLICATION OF WOUND VAC;  Surgeon: Katha Cabal, MD;  Location: [facility];  Service: Vascular;  Laterality: Left;  . APPLICATION OF WOUND VAC Left 10/14/2017   Procedure: WOUND VAC CHANGE;  Surgeon: Katha Cabal, MD;  Location: [facility];  Service: Vascular;  Laterality: Left;  left lower leg  . APPLICATION OF WOUND VAC Left 10/18/2017   Procedure: WOUND VAC CHANGE;  Surgeon: Katha Cabal, MD;  Location: [facility];  Service: Vascular;  Laterality: Left;  . APPLICATION OF WOUND VAC Left 10/07/2017   Procedure: APPLICATION OF WOUND VAC;  Surgeon: Katha Cabal, MD;  Location: [facility];  Service: Vascular;  Laterality: Left;  . AV FISTULA INSERTION W/ RF MAGNETIC GUIDANCE Right  07/19/2018   Procedure: AV FISTULA INSERTION W/RF MAGNETIC GUIDANCE;  Surgeon: Katha Cabal, MD;  Location: [place] [facility];  Service: Cardiovascular;  Laterality: Right;  . I&D EXTREMITY Left 10/11/2017   Procedure: IRRIGATION AND DEBRIDEMENT EXTREMITY;  Surgeon: Katha Cabal, MD;  Location: [facility];  Service: Vascular;  Laterality: Left;  . INCISION AND DRAINAGE ABSCESS Right 10/07/2017   Procedure: INCISION AND DRAINAGE ABSCESS;  Surgeon: Katha Cabal, MD;  Location: [facility];  Service: Vascular;  Laterality: Right;  . IRRIGATION AND DEBRIDEMENT ABSCESS Left 10/03/2017   Procedure: IRRIGATION AND DEBRIDEMENT ABSCESS with debridement of skin, soft tissue, muscle 50sq cm;  Surgeon: Algernon Huxley, MD;  Location: [facility];  Service: General;  Laterality: Left;      Allergies  Allergen Reactions  . Lisinopril Swelling  . Furosemide Other (See Comments)    Light headed, cough, blurry vision, weakness.    Current Outpatient Medications  Medication Sig Dispense Refill  . allopurinol (ZYLOPRIM) 100 MG tablet Take 100 mg by mouth daily.  6  . amLODipine (NORVASC) 10  MG tablet Take 10 mg by mouth daily.     [name] atenolol (TENORMIN) 100 MG tablet Take 1 tablet (100 mg total) by mouth daily. 30 tablet 0  . cloNIDine (CATAPRES) 0.1 MG tablet Take 0.1 mg by mouth 2 (two) times daily.     . ferrous gluconate (FERGON) 240 (27 FE) MG tablet Take 240 mg by mouth daily.    . hydrALAZINE (APRESOLINE) 50 MG tablet Take 50 mg by mouth 3 (three) times daily.   0  . Insulin Glargine (LANTUS SOLOSTAR) 100 UNIT/ML Solostar Pen Inject 12 Units into the skin 2 (two) times daily.     . Multiple Vitamins-Minerals (EYE HEALTH PO) Take 1 capsule by mouth 2 (two) times daily.    . Multiple Vitamins-Minerals (MULTIVITAMIN PO) Take 2 tablets by mouth daily.    . Omega-3 Fatty Acids (FISH OIL PO) Take 2 capsules by mouth daily.    [name] oxyCODONE-acetaminophen (PERCOCET) 5-325 MG tablet Take 1-2 tablets by  mouth every 6 (six) hours as needed for moderate pain or severe pain. 40 tablet 0  . torsemide (DEMADEX) 20 MG tablet Take 40 mg by mouth 2 (two) times daily.   11   No current facility-administered medications for this visit.         Physical Exam There were no vitals taken for this visit. Gen:  WD/WN, NAD Skin: incision C/D/I AV Fistula:  Good bruit weak thrill     Assessment/Plan:  1. Chronic renal insufficiency, stage IV (severe) (HCC) Recommend:  The patient is doing well and currently is not yet on  dialysis.  The patient should have a duplex ultrasound of the dialysis access in 3 months.  The patient will follow-up with me in the office after each ultrasound    - VAS [place] (AVF, AVG); Future      [name] 08/03/2018, 1:11 PM   This note was created with Dragon medical transcription system.  Any errors from dictation are unintentional.

## 2018-08-05 NOTE — Progress Notes (Signed)
[name], [name] ([id number]) Visit Report for 08/02/2018 Arrival Information Details Patient Name: [name], [name] Date of Service: 08/02/2018 12:45 PM Medical Record Number: [id number] Patient Account Number: [id number] Date of Birth/Sex: 11-Dec-1970 (47 y.o. M) Treating RN: [name] Primary Care [name]: [name] Other Clinician: Referring [name]: [name] Treating Tattiana Fakhouri/Extender: [name] in Treatment: 45 Visit Information History Since Last Visit Added or deleted any medications: No Patient Arrived: Ambulatory Any new allergies or adverse reactions: No Arrival Time: 12:55 Had a fall or experienced change in No Accompanied By: self activities of daily living that may affect Transfer Assistance: None risk of falls: Patient Identification Verified: Yes Signs or symptoms of abuse/neglect since last visito No Secondary Verification Process Yes Hospitalized since last visit: No Completed: Implantable device outside of the clinic excluding No Patient Requires Transmission-Based No cellular tissue based products placed in the center Precautions: since last visit: Patient Has Alerts: Yes Has Dressing in Place as Prescribed: Yes Patient Alerts: DM II Pain Present Now: No noncompressible bilateral Electronic Signature(s) Signed: 08/02/2018 4:48:38 PM By: [name] Entered By: [name] on 08/02/2018 13:04:28 [name], [name] -------------------------------------------------------------------------------- Clinic Level of Care Assessment Details Patient Name: [name], [name] Date of Service: 08/02/2018 12:45 PM Medical Record Number: [id number] Patient Account Number: [id number] Date of Birth/Sex: 09/09/1970 (47 y.o. M) Treating RN: [name] Primary Care [name]: [name] Other Clinician: Referring [name]: [name] Treating [name]/Extender: [name] in Treatment: 31 Clinic Level of Care Assessment Items TOOL  4 Quantity Score []  - Use when only an EandM is performed on FOLLOW-UP visit 0 ASSESSMENTS - Nursing Assessment / Reassessment X - Reassessment of Co-morbidities (includes updates in patient status) 1 10 X- 1 5 Reassessment of Adherence to Treatment Plan ASSESSMENTS - Wound and Skin Assessment / Reassessment X - Simple Wound Assessment / Reassessment - one wound 1 5 []  - 0 Complex Wound Assessment / Reassessment - multiple wounds []  - 0 Dermatologic / Skin Assessment (not related to wound area) ASSESSMENTS - Focused Assessment []  - Circumferential Edema Measurements - multi extremities 0 []  - 0 Nutritional Assessment / Counseling / Intervention []  - 0 Lower Extremity Assessment (monofilament, tuning fork, pulses) []  - 0 Peripheral Arterial Disease Assessment (using hand held doppler) ASSESSMENTS - Ostomy and/or Continence Assessment and Care []  - Incontinence Assessment and Management 0 []  - 0 Ostomy Care Assessment and Management (repouching, etc.) PROCESS - Coordination of Care []  - Simple Patient / Family Education for ongoing care 0 X- 1 20 Complex (extensive) Patient / Family Education for ongoing care X- 1 10 Staff obtains Programmer, systems, Records, Test Results / Process Orders []  - 0 Staff telephones HHA, Nursing Homes / Clarify orders / etc []  - 0 Routine Transfer to another Facility (non-emergent condition) []  - 0 Routine Hospital Admission (non-emergent condition) []  - 0 New Admissions / Biomedical engineer / Ordering NPWT, Apligraf, etc. []  - 0 Emergency Hospital Admission (emergent condition) X- 1 10 Simple Discharge Coordination [name], [name] ([id number]) []  - 0 Complex (extensive) Discharge Coordination PROCESS - Special Needs []  - Pediatric / Minor Patient Management 0 []  - 0 Isolation Patient Management []  - 0 Hearing / Language / Visual special needs []  - 0 Assessment of Community assistance (transportation, D/C planning, etc.) []  - 0 Additional  assistance / Altered mentation []  - 0 Support Surface(s) Assessment (bed, cushion, seat, etc.) INTERVENTIONS - Wound Cleansing / Measurement X - Simple Wound Cleansing - one wound 1 5 []  -  0 Complex Wound Cleansing - multiple wounds X- 1 5 Wound Imaging (photographs - any number of wounds) []  - 0 Wound Tracing (instead of photographs) X- 1 5 Simple Wound Measurement - one wound []  - 0 Complex Wound Measurement - multiple wounds INTERVENTIONS - Wound Dressings []  - Small Wound Dressing one or multiple wounds 0 []  - 0 Medium Wound Dressing one or multiple wounds X- 1 20 Large Wound Dressing one or multiple wounds []  - 0 Application of Medications - topical []  - 0 Application of Medications - injection INTERVENTIONS - Miscellaneous []  - External ear exam 0 []  - 0 Specimen Collection (cultures, biopsies, blood, body fluids, etc.) []  - 0 Specimen(s) / Culture(s) sent or taken to Lab for analysis []  - 0 Patient Transfer (multiple staff / Civil Service fast streamer / Similar devices) []  - 0 Simple Staple / Suture removal (25 or less) []  - 0 Complex Staple / Suture removal (26 or more) []  - 0 Hypo / Hyperglycemic Management (close monitor of Blood Glucose) []  - 0 Ankle / Brachial Index (ABI) - do not check if billed separately X- 1 5 Vital Signs [name], [name] ([id number]) Has the patient been seen at the hospital within the last three years: Yes Total Score: 100 Level Of Care: New/Established - Level 3 Electronic Signature(s) Signed: 08/02/2018 6:02:29 PM By: Gretta Cool, BSN, RN, CWS, Kim RN, BSN Entered By: Gretta Cool, BSN, RN, CWS, Kim on 08/02/2018 18:00:18 [name], [name] Kitchen ([id number]) -------------------------------------------------------------------------------- Encounter Discharge Information Details Patient Name: [name], [name] Date of Service: 08/02/2018 12:45 PM Medical Record Number: [id number] Patient Account Number: [id number] Date of Birth/Sex: 1970-09-07 (47 y.o. M) Treating RN:  [name] Primary Care Keyosha Tiedt: [name] Other Clinician: Referring Omarr Hann: [name] Treating Melenie Minniear/Extender: [name] in Treatment: 31 Encounter Discharge Information Items Discharge Condition: Stable Ambulatory Status: Ambulatory Discharge Destination: Home Transportation: Private Auto Accompanied By: self Schedule Follow-up Appointment: Yes Clinical Summary of Care: Electronic Signature(s) Signed: 08/02/2018 4:48:38 PM By: [name] Entered By: [name] on 08/02/2018 14:10:51 Knechtel, [place]. ([id number]) -------------------------------------------------------------------------------- Lower Extremity Assessment Details Patient Name: Ekstein, Kostantinos A. Date of Service: 08/02/2018 12:45 PM Medical Record Number: [id number] Patient Account Number: [id number] Date of Birth/Sex: 1970/11/16 (47 y.o. M) Treating RN: [name] Primary Care Troy Hartzog: [name] Other Clinician: Referring Azriel Jakob: [name] Treating Valla Pacey/Extender: [name] in Treatment: 31 Edema Assessment Assessed: [Left: No] [Right: No] [Left: Edema] [Right: :] Calf Left: Right: Point of Measurement: 36 cm From Medial Instep 67 cm 57 cm Ankle Left: Right: Point of Measurement: 14 cm From Medial Instep 53 cm 42 cm Vascular Assessment Claudication: Claudication Assessment [Left:None] [Right:None] Pulses: Dorsalis Pedis Palpable: [Left:Yes] [Right:Yes] Posterior Tibial Extremity colors, hair growth, and conditions: Extremity Color: [Left:Dusky] [Right:Hyperpigmented] Hair Growth on Extremity: [Left:No] [Right:No] Temperature of Extremity: [Left:Warm] [Right:Warm] Capillary Refill: [Left:< 3 seconds] [Right:< 3 seconds] Toe Nail Assessment Left: Right: Thick: Yes Yes Discolored: Yes Yes Deformed: Yes Yes Improper Length and Hygiene: Yes Yes Electronic Signature(s) Signed: 08/02/2018 4:48:38 PM By: [name] Entered By: [name] on 08/02/2018  13:15:09 Melroy, Gregory. ([id number]) -------------------------------------------------------------------------------- Multi Wound Chart Details Patient Name: [name], [name] Date of Service: 08/02/2018 12:45 PM Medical Record Number: [id number] Patient Account Number: [id number] Date of Birth/Sex: [birth year] (47 y.o. M) Treating RN: [name] Primary Care Araly Kaas: [name] Other Clinician: Referring Venise Ellingwood: [name] Treating Friedrich Harriott/Extender: [name] in Treatment: 31 Vital Signs Height(in): 76 Pulse(bpm): 64 Weight(lbs): 401.6 Blood Pressure(mmHg): 179/75  Body Mass Index(BMI): 49 Temperature(F): 98.1 Respiratory Rate 18 (breaths/min): Photos: [2:No Photos] [N/A:N/A] Wound Location: [2:Left Lower Leg - Lateral, Posterior] [N/A:N/A] Wounding Event: [2:Gradually Appeared] [N/A:N/A] Primary Etiology: [2:Diabetic Wound/Ulcer of the Lower Extremity] [N/A:N/A] Secondary Etiology: [2:Lymphedema] [N/A:N/A] Comorbid History: [2:Hypertension, Type II Diabetes, Neuropathy] [N/A:N/A] Date Acquired: [2:09/30/2017] [N/A:N/A] Weeks of Treatment: [2:31] [N/A:N/A] Wound Status: [2:Open] [N/A:N/A] Measurements L x W x D [2:4x1.4x0.1] [N/A:N/A] (cm) Area (cm) : [2:4.398] [N/A:N/A] Volume (cm) : [2:0.44] [N/A:N/A] % Reduction in Area: [2:84.40%] [N/A:N/A] % Reduction in Volume: [2:96.10%] [N/A:N/A] Classification: [2:Grade 2] [N/A:N/A] Granulation Amount: [2:None Present (0%)] [N/A:N/A] Necrotic Amount: [2:None Present (0%)] [N/A:N/A] Exposed Structures: [2:Fat Layer (Subcutaneous Tissue) Exposed: Yes Fascia: No Tendon: No Muscle: No Joint: No Bone: No] [N/A:N/A] Periwound Skin Texture: [2:Excoriation: No Induration: No Callus: No Crepitus: No Rash: No Scarring: No] [N/A:N/A] Periwound Skin Moisture: [2:Maceration: No Dry/Scaly: No] [N/A:N/A] Periwound Skin Color: [N/A:N/A] Atrophie Blanche: No Cyanosis: No Ecchymosis: No Erythema: No Hemosiderin  Staining: No Mottled: No Pallor: No Rubor: No Tenderness on Palpation: No N/A N/A Wound Preparation: Ulcer Cleansing: N/A N/A Rinsed/Irrigated with Saline Topical Anesthetic Applied: Other: lidocaine 4% Treatment Notes Electronic Signature(s) Signed: 08/02/2018 4:50:38 PM By: Linton Ham MD Entered By: Linton Ham on 08/02/2018 14:05:49 Poet, Binnie [name] Kitchen ([id number]) -------------------------------------------------------------------------------- Multi-Disciplinary Care Plan Details Patient Name: [name], [name] Date of Service: 08/02/2018 12:45 PM Medical Record Number: [id number] Patient Account Number: [id number] Date of Birth/Sex: 07-Sep-1970 (47 y.o. M) Treating RN: [name] Primary Care Tima Curet: [name] Other Clinician: Referring Romero Letizia: [name] Treating Audreena Sachdeva/Extender: [name] in Treatment: 70 Active Inactive Abuse / Safety / Falls / Self Care Management Nursing Diagnoses: Potential for falls Goals: Patient will remain injury free related to falls Date Initiated: 12/28/2017 Target Resolution Date: 04/08/2018 Goal Status: Active Interventions: Assess fall risk on admission and as needed Notes: Nutrition Nursing Diagnoses: Impaired glucose control: actual or potential Goals: Patient/caregiver verbalizes understanding of need to maintain therapeutic glucose control per primary care physician Date Initiated: 12/28/2017 Target Resolution Date: 04/08/2018 Goal Status: Active Interventions: Provide education on elevated blood sugars and impact on wound healing Notes: Orientation to the Wound Care Program Nursing Diagnoses: Knowledge deficit related to the wound healing center program Goals: Patient/caregiver will verbalize understanding of the [facility] Date Initiated: 12/28/2017 Target Resolution Date: 04/08/2018 Goal Status: Active Interventions: Provide education on orientation to the wound center Arista, [place]  A. ([id number]) Notes: Venous Leg Ulcer Nursing Diagnoses: Knowledge deficit related to disease process and management Goals: Patient will maintain optimal edema control Date Initiated: 03/27/2018 Target Resolution Date: 04/27/2018 Goal Status: Active Interventions: Assess peripheral edema status every visit. Compression as ordered Treatment Activities: Therapeutic compression applied : 03/22/2018 Notes: Wound/Skin Impairment Nursing Diagnoses: Impaired tissue integrity Goals: Ulcer/skin breakdown will heal within 14 weeks Date Initiated: 12/28/2017 Target Resolution Date: 04/08/2018 Goal Status: Active Interventions: Assess patient/caregiver ability to obtain necessary supplies Assess patient/caregiver ability to perform ulcer/skin care regimen upon admission and as needed Assess ulceration(s) every visit Notes: Electronic Signature(s) Signed: 08/02/2018 6:02:29 PM By: Gretta Cool, BSN, RN, CWS, Kim RN, BSN Entered By: Gretta Cool, BSN, RN, CWS, Kim on 08/02/2018 13:32:06 [name], [name] ([id number]) -------------------------------------------------------------------------------- Non-Wound Condition Assessment Details Patient Name: [name], [name] Date of Service: 08/02/2018 12:45 PM Medical Record Number: [id number] Patient Account Number: [id number] Date of Birth/Sex: [birth year] (47 y.o. M) Treating RN: [name] Primary Care Irys Nigh: [name] Other Clinician: Referring Soua Lenk: [name] Treating Adryan Druckenmiller/Extender: [name] in Treatment:  31 Non-Wound Condition: Condition: Lymphedema Location: Leg Side: Bilateral Periwound Skin Texture Texture Color No Abnormalities Noted: No No Abnormalities Noted: No Callus: No Atrophie Blanche: No Crepitus: No Cyanosis: No Excoriation: Yes Ecchymosis: No Friable: No Erythema: No Induration: Yes Hemosiderin Staining: Yes Rash: No Mottled: No Scarring: No Pallor: No Rubor: Yes Moisture No Abnormalities Noted:  No Dry / Scaly: No Maceration: No Electronic Signature(s) Signed: 08/02/2018 4:48:38 PM By: [name] Entered By: [name] on 08/02/2018 13:16:03 [name], [name] ([id number]) -------------------------------------------------------------------------------- Pain Assessment Details Patient Name: Meara, Albeiro A. Date of Service: 08/02/2018 12:45 PM Medical Record Number: [id number] Patient Account Number: [id number] Date of Birth/Sex: 1971-06-08 (47 y.o. M) Treating RN: [name] Primary Care Mats Jeanlouis: [name] Other Clinician: Referring Annastyn Silvey: [name] Treating Sidharth Leverette/Extender: [name] in Treatment: 31 Active Problems Location of Pain Severity and Description of Pain Patient Has Paino No Site Locations Pain Management and Medication Current Pain Management: Goals for Pain Management pt denies any pain at this time. Electronic Signature(s) Signed: 08/02/2018 4:48:38 PM By: [name] Entered By: [name] on 08/02/2018 13:04:46 [name], [name] Kitchen ([id number]) -------------------------------------------------------------------------------- Patient/Caregiver Education Details Patient Name: Dockstader, Royce A. Date of Service: 08/02/2018 12:45 PM Medical Record Number: [id number] Patient Account Number: [id number] Date of Birth/Gender: [birth year] (47 y.o. M) Treating RN: [name] Primary Care Physician: [name] Other Clinician: Referring Physician: Gaetano [name] Treating Physician/Extender: [name] in Treatment: 7 Education Assessment Education Provided To: Patient Education Topics Provided Nutrition: Handouts: Nutrition Methods: Explain/Verbal Responses: State content correctly Electronic Signature(s) Signed: 08/02/2018 4:48:38 PM By: [name] Entered By: [name] on 08/02/2018 14:11:50 [name], [name] ([id number]) -------------------------------------------------------------------------------- Wound Assessment Details Patient  Name: Miralles, Jatavious A. Date of Service: 08/02/2018 12:45 PM Medical Record Number: [id number] Patient Account Number: [id number] Date of Birth/Sex: [birth year] (47 y.o. M) Treating RN: [name] Primary Care Sky Borboa: [name] Other Clinician: Referring Burgandy Hackworth: [name] Treating Darwin Rothlisberger/Extender: [name] in Treatment: 31 Wound Status Wound Number: 2 Primary Etiology: Diabetic Wound/Ulcer of the Lower Extremity Wound Location: Left Lower Leg - Lateral, Posterior Secondary Lymphedema Wounding Event: Gradually Appeared Etiology: Date Acquired: 09/30/2017 Wound Status: Open Weeks Of Treatment: 31 Comorbid History: Hypertension, Type II Diabetes, Clustered Wound: No Neuropathy Photos Photo Uploaded By: [name] on 08/02/2018 14:53:25 Wound Measurements Length: (cm) 4 Width: (cm) 1.4 Depth: (cm) 0.1 Area: (cm) 4.398 Volume: (cm) 0.44 % Reduction in Area: 84.4% % Reduction in Volume: 96.1% Tunneling: No Undermining: No Wound Description Classification: Grade 2 Wound Bed Granulation Amount: None Present (0%) Exposed Structure Necrotic Amount: None Present (0%) Fascia Exposed: No Fat Layer (Subcutaneous Tissue) Exposed: Yes Tendon Exposed: No Muscle Exposed: No Joint Exposed: No Bone Exposed: No Periwound Skin Texture Texture Color No Abnormalities Noted: No No Abnormalities Noted: No Callus: No Atrophie Blanche: No Crepitus: No Cyanosis: No Moates, Brittin A. ([id number]) Excoriation: No Ecchymosis: No Induration: No Erythema: No Rash: No Hemosiderin Staining: No Scarring: No Mottled: No Pallor: No Moisture Rubor: No No Abnormalities Noted: No Dry / Scaly: No Maceration: No Wound Preparation Ulcer Cleansing: Rinsed/Irrigated with Saline Topical Anesthetic Applied: Other: lidocaine 4%, Treatment Notes Wound #2 (Left, Lateral, Posterior Lower Leg) 1. Cleansed with: Clean wound with Normal Saline 2. Anesthetic Topical Lidocaine  4% cream to wound bed prior to debridement 7. Secured with Tape Notes Silvercell, xsorb on draining areas, Electronic Signature(s) Signed: 08/02/2018 4:48:38 PM By: [name] Entered By: [name] on 08/02/2018 13:12:47 Stann, Kile A. ([id number]) --------------------------------------------------------------------------------  Vitals Details Patient Name: Torosian, Crosley A. Date of Service: 08/02/2018 12:45 PM Medical Record Number: [id number] Patient Account Number: [id number] Date of Birth/Sex: 11/15/1970 (47 y.o. M) Treating RN: [name] Primary Care Leviticus Harton: [name] Other Clinician: Referring Ponciano [name]: [name] Treating Dezarae Mcclaran/Extender: [name] in Treatment: 31 Vital Signs Time Taken: 13:04 Temperature (F): 98.1 Height (in): 76 Pulse (bpm): 64 Weight (lbs): 401.6 Respiratory Rate (breaths/min): 18 Body Mass Index (BMI): 48.9 Blood Pressure (mmHg): 179/75 Reference Range: 80 - 120 mg / dl Electronic Signature(s) Signed: 08/02/2018 4:48:38 PM By: [name] Entered By: [name] on 08/02/2018 13:05:18

## 2018-08-05 NOTE — Progress Notes (Signed)
JANSON, LAMAR ([id number]) Visit Report for 08/02/2018 HPI Details Patient Name: [name], [name] Date of Service: 08/02/2018 12:45 PM Medical Record Number: [id number] Patient Account Number: [id number] Date of Birth/Sex: 02-21-1971 (47 y.o. M) Treating RN: Cornell Barman Primary Care Provider: Gaetano Net Other Clinician: Referring Provider: Gaetano Net Treating Provider/Extender: Tito Dine in Treatment: 31 History of Present Illness HPI Description: 12/28/17; this is a now 47 year old man who is a type II diabetic. He was hospitalized from 10/01/17 through 10/19/17. He had an MSSA soft tissue and skin infection. 2 open areas on the left leg were identified he has a smaller area on the left medial calf superiorly just below the knee and a wound just above the left ankle on the posterior medial aspect. I think both of these were surgical IandD sites when he was in the hospital. He was discharged with a wound VAC at that point however this is since been taken off. He follows with Dr. Ola Spurr for the [facility] and he is still on chronic Keflex at 500 twice a day. At that time he was hospitalized his hemoglobin A1c was 15.1 however if I'm reading his endocrinologist notes correctly that is improved. He has been following with Dr. [name] at vein and vascular and he has been applying calcium alginate and Unna boots. He has home health changing the dressing. They have also been attempting to get him external compression pumps although the patient is unaware whether they've been approved by insurance at this point. as mentioned he has a smaller clean wound on the right lateral calf just below the knee and he has a much larger area just above the left ankle medially and posteriorly. Our intake nurse reported greenish purulent looking drainage.the patient did have surgical material sent to pathology in February. This showed chronic abscess The patient also has lymphedema stage III in the left  greater than right lower extremities. He has a history of blisters with wounds but these of all were always healed. The patient thinks that the lymphedema may have been present since he was about 47 years old i.e. about 30 years. He does not have graded pressure stockings and has not worn stockings. He does not have a distant history of DVT PE or phlebitis. He has not been systemically unwell fever no chills. He states that his Lasix is recently been reduced. He tells me his kidney function is at "30%" and he has been followed by Dr. [name] of nephrology. At one point he was on Lasix 80 twice a day however that's been cut back and he is now on Lasix at 20 twice a day. The patient has a history of PAD listed in his records although he comes from Dr. [name] I don't think is felt to have significant PAD. ABIs in our clinic were noncompressible bilaterally. 01/04/18; patient has a large wound on the left lateral lower calf and a small wound on the left medial upper calf. He has been to see his nephrologist who changed him to Demadex 40 mg a day. I'm hopeful this will help with his systemic fluid overload. He also has stage III lymphedema. Really no change in the 2 wounds since last week 01/11/18; the patient is down 13 pounds. He put his stage III lymphedema left leg in 4K compression last week and there is less edema fluid however we still haven't been able to communicate with home health but apparently it is [facility] but the dressings have not been changed. The  patient noted an odor last week. He is also had compression pumps ordered by [facility] this as a not completed the paperwork stage. 01/18/18; patient continues to lose weight. Stage III lymphedema in the left greater than right leg under for alert compression. The major wound is on the left lateral ankle area. He apparently has bilateral compression pumps being brought to his house, these were ordered by [facility] Notable for the fact today he had some blisters on the right anterior leg together with some skin nodules. This is no doubt secondary to severe lymphedema. 01/25/18; the patient has obtained his compression pumps and is using them per vein and vascular instructions 3 times a day for an hour. He also saw [name] of vein and vascular. He was felt to have venous insufficiency but did not suggest any intervention also improved edema. It was suggested that he have compression stockings 20-30 mm on a daily basis in addition to compression pumps. Minton, Whitman A. ([id number]) The patient arrives in clinic today with a layer of unna under for layer compression. he seems to have some trouble with the degree of compression. He has open areas on the left lateral ankle area which is his major wound left upper medial calf and a superficial open area on the right anterior shin area which was blistered last week. He has skin changes on the right anterior calf which I think are no doubt secondary to lymphedema skin nodules etc. 02/01/18; the patient comes in telling us his nephrologist have to his torsemide. Unfortunately today he is put on 7 pounds by our scales. He has blisters all over the anterior and medial part of his right calf and a new open wound. He also has soupy green drainage coming out of the left lateral calf /ankle wound. 02/08/18; culture I did last week of the left lateral ankle wound grew both Pseudomonas and Morganella. He is on Keflex from Dr. Ola Spurr in the hospital. I will need to review these notes.in any case Keflex is not going to cover these 2 organisms. I'm probably going to added ciprofloxacin today for 1 week. A lot of drainage that looks purulent last week. He is not complaining of pain however he has managed to put on 10 pounds in 2 weeks by our scales in this clinic. He is going to see his nephrologist tomorrow 02/15/18; he completed the ciprofloxacin I gave him last week.  Notable that he is up to 379 pounds today which is up 16 pounds from 2 weeks ago. He is complaining of orthopnea but doesn't have any chest pain. 02/22/18; he continues to have weight gain. R intake nurse reports again purulent green drainage coming out of the lateral wound on the lateral left calf. He has small open area on the right anterior leg. His torsemide was increased to 2 tablets a day I believe this is 40 mg last week in response to the call admitted to Dr. Keturah Barre office. He follows up with Dr. [name] and Dr. Delana Meyer tomorrow 03/01/18 his weight essentially stable today at 383 pounds. Drainage out of the left lateral wound on the lower left calf/ankle is a lot less. Culture last time grew Pseudomonas. I put him on cefdinir. He has been to Dr. Keturah Barre office no adjustments in his diuretics. Dr. Nicoletta Dress prescribed a wraparound stocking for the right leg.there is no open area on the right leg. He has a superficial area on the left medial calf, left posterior  calf and in the large area on the left lateral however this looks better 03/15/18; weight is not up to 393 pounds. He saw his nephrologist yesterday Dr. [name] will increase the Demadex I'm hopeful this will help with the edema control. I'm using silver alginate to all his wounds. In particular the left lateral ankle looks better. oUnfortunately he has new open areas on the right lateral calf that will include use of his compression stocking at least in the short to medium term. He has new wounds o3 on the right lateral calf. One of these has some size however all numerous superficial 03/22/18; his weight is stabilized a bit. Just adjustment of his diuretics by his nephrologist. There is no doubt he has some degree of systemic fluid overload on top of severe left greater than right lymphedema. 2 weeks ago tried to transition him to stockings on the right leg however he developed re-breakdown of skin on the right leg and we had to put him back  in compression last week. He also uses external compression pumps and claims to be compliant Continued concern about his depression today 03/29/18; several ongoing issues with this patient; oHe no longer has home health coverage apparently secondary to a lapse in insurance. He is apparently transitioning from short for long-term disability. [facility] was changing his compression wraps on Monday and Friday. oHe has gained 10 pounds since last week oSees Dr. [name] tomorrow oSaw his primary doctor last week about the depression. It sounds as though he declined pharmacologic management. He seems somewhat better today. We were concerned last week when he came. Somewhat better today oHe is using his compression pumps once a day at home, I have asked for twice a day if possible especially on the left leg oFollows up with his nephrologist in mid-August. He is managing his diuretic for I think stage IV chronic renal failure oParadoxically his wounds actually look better 04/19/18; the patient has not been seen since I last saw him 3 weeks ago. He saw Dr. Delana Meyer of vascular surgery on 03/30/18 I believe he put him in a 20/30 stocking bilaterally with a wraparound extremitease stocking. He has not been putting anything specifically on the wound. More problematic than that he has not been wearing the stockings he is at home. He has been using his external compression pumps once per day according to him on a rare occasion twice On a psychosocial level the patient is now on long-term disability and is applying for COBRA therefore he is between insurances. He has not been able to follow up with Dr. [name] who is his nephrologist as a result. As noted his weight is up to 407 pounds today. He promises me he'll follow-up with Dr. [name] 05/03/18; he hasn't been here in 2 weeks now. He apparently has been wearing a compression sock on the left leg. Massive increase in edema 3 large open wounds on the left anterior  leg that were probably blisters. Significant deterioration in the left lateral ankle wound that we've been doing as his most problematic wound. He still does not have his insurance issues wrapped up. His weight is well over 400 pounds. 05/10/18; arrives today with better looking edema control in the left leg. He has been using his compression pumps twice a day. We also wrapped it is left leg for there he's been using his pumps so there is much better edema control. Most of new Royall, Marte A. ([id number]) wounds from last week look a  lot better. Even the refractory area on the lower left lateral ankle looks a lot better to me today. He follows up with Dr. [name] this morning [nephrology] 05/17/18 patient arrives today with a lot less edema in the left leg. His weight is gone down 7 pounds. He tells me he saw Dr. [name] but his torsemide was not adjusted. He is using the palms 3 times a day. There is been quite an improvement in the remaining wound on the left lateral ankle oWeekly visit for follow-up of bilateral lower extremity wounds related to severe lymphedema and probably some degree of systemic fluid overload from chronic renal failure stage IV. His weight is up this week to over 400 pounds. He noted increasing edema in the right leg late last week. He took his compression stocking off he did not increase the frequency of this compression pump use. He developed a large blister on the back of the right calf. He also has a new opened blister on the left lateral calf in addition to the wound that we've been using on the distal left lower calf area and we've been using silver alginate. He tells me that he has an ultrasound which is a DVT rule out and I think a reflux study ordered by Dr. [name] 05/31/18; weekly visit. He went to see vein and vascular this week apparently they remove the 4-layer compression on the left and put an Unna boot on him in replacement. He's got more swelling in the left leg  is resolved. That being said his left lower Wound is better. He still has a fairly large wound on the right posterior calf this was not disturbed. He has a follow-up appointment with Dr. Keturah Barre nephrologist next week 06/07/2018; the patient arrives today with a history that he took both his compression wraps off 3 days ago in order to take a shower. He has bilateral severe tense blisters. A lot of weeping erythema especially on the lateral left calf. Almost circumferential blisters on the right. Multiple areas of epithelial breakdown. He does not complain of any pain fever chills. He states he has been using his compression pump in some form of stocking that I could not really determine the type. He did not come into the clinic with anything on his legs. He tells me he has an appointment with Dr. Merita Norton his nephrologist I believe this Friday. Paradoxically his weight is actually down to 385 pounds I believe last week he was over 400 06/14/18; arrives with better looking wound surfaces today on both legs. There is no further blistering however there are still areas that aren't epithelialized on the right anterior, right lateral, left posterior and left medial. His original wound just above the left ankle laterally is still open moist. His weight was about the same today. He tells me that his nephrologist increase the torsemide from 40/20/09/1939/40 twice a day 06/21/18; both his wraps fell down to his mid calf. Has a result he has multiple blisters across the anterior right leg above with the wraps ultimately watched. He also has blisters on the posterior part of the left calf o2. His original wound on the left lateral calf is hard to see any open area. There is however a divot. Which is going to be difficult to deal with into the future until the edema in the left leg is controlled. 2 small superficial areas remain He tells [place] his weight was 386 pounds on his scale at home. According to our scale he  is up 5 pounds. He is not on a fluid restriction 06/28/18; patient's weight is gone up 2 pounds since last time. He has weeping edema and open superficial wounds on the right posterior right lateral and right anterior calf. He has a small open area on the left anterior probably left posterior calf and the original wound on the left lateral calf appears to be just about closed He is being planned for a dialysis shunt in the right arm through [facility] incoordination with his nephrologist Dr. [name] He claims to be using his compression pumps twice a day. He has lymphedema and no doubt systemic fluid volume overload from stage IV chronic renal failure 07/04/18; patient's weight is up into the 396 range. He is supposed to see his cardiologist tomorrow and plans are being made for a shunt by Dr. Delana Meyer. He still has significant open wounds/draining areas on the right anterior and right posterior calf. Several small open areas on the left which are less in terms of wound area on the right which is surprising given the fact the left is the larger most lymphedematous leg 07/26/2018 She is seen today for follow-up and management of lateral posterior lower leg wound and bilateral lymphoedema. He has a very flat affect and depressed mood today. Unable to elicit much information from him today. No thoughts of harm to self identified. Recently had a follow-up with vascular vein for fistula placement to initiate dialysis in the right arm. He has 2 dressings on the right arm from the fistula procedure with no bleeding or drainage noted on the dressing. He does have a large amount of bruising in the surrounding to puncture areas on the right lower arm from the fistula placement. No pain elicited with palpation of the right arm. He denies a diminished sensation in that right lower arm as well. He does have a small wound to the left lateral posterior lower leg. No visual drainage present to either leg.  However the wound to the left lower leg does have a foul odor even after cleaning it. The left is the larger most lymphedematous leg with the right Leg still having a significant amount of edema. No drainage or blisters present on either leg today. He states that he is using the lymphedema pumps twice a day. Not too sure if he is compliant with actually using the lymphedema pumps based on the amount of edema that he has in his legs. Weight increased from 396.1 to 401.6. No recent fevers, chills, or shortness of breath. 08/02/18; the patient only has one remaining wound on the left lateral calf which is still open. This is in the resultant [facility] [place]. ([id number]) shaped area which was once the site of the major wound. He does not have any open areas on the right leg nor additional wounds on the left no blistering. As usual the left leg is much larger than the right. He comes in today stating that he took the wraps off on the right leg on Friday and since then he has presumably been wearing his stocking which is a wraparound variant stocking on the right. States he took the 4-layer compression off his left leg this morning to shower. He is seeing vein and vascular tomorrow. He states he is using his compression pumps once or twice a day. He also admits that he has been on his feet a Engineer, structural) Signed: 08/02/2018 4:50:38 PM By: Linton Ham MD Entered By: Linton Ham on  08/02/2018 14:08:38 Volker, Scotty A. ([id number]) -------------------------------------------------------------------------------- Physical Exam Details Patient Name: [name], [name] Date of Service: 08/02/2018 12:45 PM Medical Record Number: [id number] Patient Account Number: [id number] Date of Birth/Sex: 03/28/1971 (47 y.o. M) Treating RN: Cornell Barman Primary Care Provider: Gaetano Net Other Clinician: Referring Provider: Gaetano Net Treating Provider/Extender: Tito Dine in  Treatment: 14 Constitutional Patient is hypertensive.. Pulse regular and within target range for patient.[name] Respirations regular, non-labored and within target range.. Temperature is normal and within the target range for the patient.. Eyes Conjunctivae clear. No discharge. Respiratory Respiratory effort is easy and symmetric bilaterally. Rate is normal at rest and on room air.. Bilateral breath sounds are clear and equal in all lobes with no wheezes, rales or rhonchi.. Cardiovascular Heart rhythm and rate regular, without murmur or gallop.JVP is not elevated he has no coccyx edema. Pedal pulses absent bilaterally.. Severe nonpitting Edema left greater than right compatible with severe lymphedema.. Integumentary (Hair, Skin) He has a new shunt placed in his right forearm. Bilateral secondary lymphedema left greater than right. Psychiatric Patient appears depressed today.. Notes Wound exam; left lateral posterior lower leg. There is a small remaining open area in the middle of what was a very large wound area. This is in the setting of overlying tissue creating a divot /[place] shaped wound area. This was once a very large opening down to very tiny centimeter sized openings. Base of these wounds look stable. Otherwise there is no open area on the right leg and no additional open area on his chronically enlarged left leg. Electronic Signature(s) Signed: 08/02/2018 4:50:38 PM By: Linton Ham MD Entered By: Linton Ham on 08/02/2018 14:12:14 Saulter, Subhan AMarland Kitchen ([id number]) -------------------------------------------------------------------------------- Physician Orders Details Patient Name: Morrell, Sage A. Date of Service: 08/02/2018 12:45 PM Medical Record Number: [id number] Patient Account Number: [id number] Date of Birth/Sex: Dec 23, 1970 (47 y.o. M) Treating RN: Cornell Barman Primary Care Provider: Gaetano Net Other Clinician: Referring Provider: Gaetano Net Treating Provider/Extender:  Tito Dine in Treatment: 34 Verbal / Phone Orders: No Diagnosis Coding Wound Cleansing Wound #2 Left,Lateral,Posterior Lower Leg o Clean wound with Normal Saline. o Cleanse wound with mild soap and water Anesthetic (add to Medication List) Wound #2 Left,Lateral,Posterior Lower Leg o Topical Lidocaine 4% cream applied to wound bed prior to debridement (In Clinic Only). Primary Wound Dressing Wound #2 Left,Lateral,Posterior Lower Leg o Silver Alginate - on all draining areas Secondary Dressing Wound #2 Left,Lateral,Posterior Lower Leg o Other - absorbent dressing on all weeping areas Dressing Change Frequency Wound #2 Left,Lateral,Posterior Lower Leg o Change Dressing Monday, Wednesday, Friday - Wednesdays in [place] [facility] Follow-up Appointments Wound #2 Left,Lateral,Posterior Lower Leg o Return Appointment in 1 week. Edema Control Wound #2 Left,Lateral,Posterior Lower Leg o 4-Layer Compression System - Left Lower Extremity. o Patient to wear own Velcro compression garment. - on Right leg o Elevate legs to the level of the heart and pump ankles as often as possible o Compression Pump: Use compression pump on left lower extremity for 30 minutes, twice daily. - 1 Hour three times Daily o Compression Pump: Use compression pump on right lower extremity for 30 minutes, twice daily. - 1 Hour three times daily Home Health Wound #2 [place] Visits - [facility]: Monday, Wednesday and Friday. o Home Health Nurse may visit PRN to address patientos wound care needs. o FACE TO FACE ENCOUNTER: MEDICARE and MEDICAID PATIENTS: I certify that this patient is under my care and that I  had a face-to-face encounter that meets the physician face-to-face encounter requirements with this patient on this date. The encounter with the patient was in whole or in part for the following [place].  ([id number]) CONDITION: (primary reason for Home Healthcare) MEDICAL NECESSITY: I certify, that based on my findings, NURSING services are a medically necessary home health service. HOME BOUND STATUS: I certify that my clinical findings support that this patient is homebound (i.e., Due to illness or injury, pt requires aid of supportive devices such as crutches, cane, wheelchairs, walkers, the use of special transportation or the assistance of another person to leave their place of residence. There is a normal inability to leave the home and doing so requires considerable and taxing effort. Other absences are for medical reasons / religious services and are infrequent or of short duration when for other reasons). o If current dressing causes regression in wound condition, may D/C ordered dressing product/s and apply Normal Saline Moist Dressing daily until next [place] / Other MD appointment. [place] of regression in wound condition at [phone]. o Please direct any NON-WOUND related issues/requests for orders to patient's Primary Care Physician Electronic Signature(s) Signed: 08/02/2018 4:50:38 PM By: Linton Ham MD Signed: 08/02/2018 6:02:29 PM By: Gretta Cool, BSN, RN, CWS, Kim RN, BSN Entered By: Gretta Cool, BSN, RN, CWS, Kim on 08/02/2018 13:42:56 Zachow, Abu AMarland Kitchen ([id number]) -------------------------------------------------------------------------------- Problem List Details Patient Name: [name], [name] Date of Service: 08/02/2018 12:45 PM Medical Record Number: [id number] Patient Account Number: [id number] Date of Birth/Sex: 1971-02-21 (47 y.o. 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 ([id number]) -------------------------------------------------------------------------------- Progress Note Details Patient Name: [name], [name] Date of Service: 08/02/2018 12:45 PM Medical Record Number: [id number] Patient Account Number: [id number] Date of Birth/Sex: [birth year] (47 y.o. M) Treating RN: Cornell Barman Primary Care Provider: Gaetano Net Other Clinician: Referring Provider: Gaetano Net Treating Provider/Extender: Tito Dine in Treatment: 31 Subjective History of Present Illness (HPI) 12/28/17; this is a now 46 year old man who is a type II diabetic. He was hospitalized from 10/01/17 through 10/19/17. He had an MSSA soft tissue and skin infection. 2 open areas on the left leg were identified he has a smaller area on the left medial calf superiorly just below the knee and a wound just above the left ankle on the posterior medial aspect. I think both of these were surgical IandD sites when he was in the hospital. He was discharged with a wound VAC at that point however this is since been taken off. He follows with Dr. Ola Spurr for the [facility] and he is still on chronic Keflex at 500 twice a day. At that time he was hospitalized his hemoglobin A1c was 15.1 however if I'm reading his endocrinologist notes correctly that is improved. He has been following with  Dr. [name] at vein and vascular and he has been  applying calcium alginate and Unna boots. He has home health changing the dressing. They have also been attempting to get him external compression pumps although the patient is unaware whether they've been approved by insurance at this point. as mentioned he has a smaller clean wound on the right lateral calf just below the knee and he has a much larger area just above the left ankle medially and posteriorly. Our intake nurse reported greenish purulent looking drainage.the patient did have surgical material sent to pathology in February. This showed chronic abscess The patient also has lymphedema stage III in the left greater than right lower extremities. He has a history of blisters with wounds but these of all were always healed. The patient thinks that the lymphedema may have been present since he was about 47 years old i.e. about 30 years. He does not have graded pressure stockings and has not worn stockings. He does not have a distant history of DVT PE or phlebitis. He has not been systemically unwell fever no chills. He states that his Lasix is recently been reduced. He tells me his kidney function is at "30%" and he has been followed by Dr. [name] of nephrology. At one point he was on Lasix 80 twice a day however that's been cut back and he is now on Lasix at 20 twice a day. The patient has a history of PAD listed in his records although he comes from Dr. [name] I don't think is felt to have significant PAD. ABIs in our clinic were noncompressible bilaterally. 01/04/18; patient has a large wound on the left lateral lower calf and a small wound on the left medial upper calf. He has been to see his nephrologist who changed him to Demadex 40 mg a day. I'm hopeful this will help with his systemic fluid overload. He also has stage III lymphedema. Really no change in the 2 wounds since last week 01/11/18; the patient is down 13 pounds. He put his stage III lymphedema left leg in 4K compression last  week and there is less edema fluid however we still haven't been able to communicate with home health but apparently it is [facility] but the dressings have not been changed. The patient noted an odor last week. He is also had compression pumps ordered by [facility] this as a not completed the paperwork stage. 01/18/18; patient continues to lose weight. Stage III lymphedema in the left greater than right leg under for alert compression. The major wound is on the left lateral ankle area. He apparently has bilateral compression pumps being brought to his house, these were ordered by [facility] Notable for the fact today he had some blisters on the right anterior leg together with some skin nodules. This is no doubt secondary to severe lymphedema. 01/25/18; the patient has obtained his compression pumps and is using them per vein and vascular instructions 3 times a day for an hour. He also saw [name] of vein and vascular. He was felt to have venous insufficiency but did not suggest any intervention also improved edema. It was suggested that he have compression stockings 20-30 mm on a daily basis in addition to compression pumps. The patient arrives in clinic today with a layer of unna under for layer compression. he seems to have some trouble with the degree of compression. He has open areas on the left lateral ankle area which is his major wound  left upper medial calf and a Mages, [name] ([id number]) superficial open area on the right anterior shin area which was blistered last week. He has skin changes on the right anterior calf which I think are no doubt secondary to lymphedema skin nodules etc. 02/01/18; the patient comes in telling us his nephrologist have to his torsemide. Unfortunately today he is put on 7 pounds by our scales. He has blisters all over the anterior and medial part of his right calf and a new open wound. He also has soupy green drainage coming out of  the left lateral calf /ankle wound. 02/08/18; culture I did last week of the left lateral ankle wound grew both Pseudomonas and Morganella. He is on Keflex from Dr. Ola Spurr in the hospital. I will need to review these notes.in any case Keflex is not going to cover these 2 organisms. I'm probably going to added ciprofloxacin today for 1 week. A lot of drainage that looks purulent last week. He is not complaining of pain however he has managed to put on 10 pounds in 2 weeks by our scales in this clinic. He is going to see his nephrologist tomorrow 02/15/18; he completed the ciprofloxacin I gave him last week. Notable that he is up to 379 pounds today which is up 16 pounds from 2 weeks ago. He is complaining of orthopnea but doesn't have any chest pain. 02/22/18; he continues to have weight gain. R intake nurse reports again purulent green drainage coming out of the lateral wound on the lateral left calf. He has small open area on the right anterior leg. His torsemide was increased to 2 tablets a day I believe this is 40 mg last week in response to the call admitted to Dr. Keturah Barre office. He follows up with Dr. [name] and Dr. Delana Meyer tomorrow 03/01/18 his weight essentially stable today at 383 pounds. Drainage out of the left lateral wound on the lower left calf/ankle is a lot less. Culture last time grew Pseudomonas. I put him on cefdinir. He has been to Dr. Keturah Barre office no adjustments in his diuretics. Dr. Nicoletta Dress prescribed a wraparound stocking for the right leg.there is no open area on the right leg. He has a superficial area on the left medial calf, left posterior calf and in the large area on the left lateral however this looks better 03/15/18; weight is not up to 393 pounds. He saw his nephrologist yesterday Dr. [name] will increase the Demadex I'm hopeful this will help with the edema control. I'm using silver alginate to all his wounds. In particular the left lateral ankle looks  better. Unfortunately he has new open areas on the right lateral calf that will include use of his compression stocking at least in the short to medium term. He has new wounds o3 on the right lateral calf. One of these has some size however all numerous superficial 03/22/18; his weight is stabilized a bit. Just adjustment of his diuretics by his nephrologist. There is no doubt he has some degree of systemic fluid overload on top of severe left greater than right lymphedema. 2 weeks ago tried to transition him to stockings on the right leg however he developed re-breakdown of skin on the right leg and we had to put him back in compression last week. He also uses external compression pumps and claims to be compliant Continued concern about his depression today 03/29/18; several ongoing issues with this patient; He no longer has home health coverage apparently secondary to a  lapse in insurance. He is apparently transitioning from short for long-term disability. [facility] was changing his compression wraps on Monday and Friday. He has gained 10 pounds since last week Sees Dr. [name] tomorrow Saw his primary doctor last week about the depression. It sounds as though he declined pharmacologic management. He seems somewhat better today. We were concerned last week when he came. Somewhat better today He is using his compression pumps once a day at home, I have asked for twice a day if possible especially on the left leg Follows up with his nephrologist in mid-August. He is managing his diuretic for I think stage IV chronic renal failure Paradoxically his wounds actually look better 04/19/18; the patient has not been seen since I last saw him 3 weeks ago. He saw Dr. Delana Meyer of vascular surgery on 03/30/18 I believe he put him in a 20/30 stocking bilaterally with a wraparound extremitease stocking. He has not been putting anything specifically on the wound. More problematic than that he has not been  wearing the stockings he is at home. He has been using his external compression pumps once per day according to him on a rare occasion twice On a psychosocial level the patient is now on long-term disability and is applying for COBRA therefore he is between insurances. He has not been able to follow up with Dr. [name] who is his nephrologist as a result. As noted his weight is up to 407 pounds today. He promises me he'll follow-up with Dr. [name] 05/03/18; he hasn't been here in 2 weeks now. He apparently has been wearing a compression sock on the left leg. Massive increase in edema 3 large open wounds on the left anterior leg that were probably blisters. Significant deterioration in the left lateral ankle wound that we've been doing as his most problematic wound. He still does not have his insurance issues wrapped up. His weight is well over 400 pounds. 05/10/18; arrives today with better looking edema control in the left leg. He has been using his compression pumps twice a day. We also wrapped it is left leg for there he's been using his pumps so there is much better edema control. Most of new wounds from last week look a lot better. Even the refractory area on the lower left lateral ankle looks a lot better to me today. He follows up with Dr. [name] this morning [nephrology] 05/17/18 patient arrives today with a lot less edema in the left leg. His weight is gone down 7 pounds. He tells me he saw Dr. Helene Kelp, Channon A. ([id number]) [name] but his torsemide was not adjusted. He is using the palms 3 times a day. There is been quite an improvement in the remaining wound on the left lateral ankle Weekly visit for follow-up of bilateral lower extremity wounds related to severe lymphedema and probably some degree of systemic fluid overload from chronic renal failure stage IV. His weight is up this week to over 400 pounds. He noted increasing edema in the right leg late last week. He took his compression stocking  off he did not increase the frequency of this compression pump use. He developed a large blister on the back of the right calf. He also has a new opened blister on the left lateral calf in addition to the wound that we've been using on the distal left lower calf area and we've been using silver alginate. He tells me that he has an ultrasound which is a DVT rule out  and I think a reflux study ordered by Dr. [name] 05/31/18; weekly visit. He went to see vein and vascular this week apparently they remove the 4-layer compression on the left and put an Unna boot on him in replacement. He's got more swelling in the left leg is resolved. That being said his left lower Wound is better. He still has a fairly large wound on the right posterior calf this was not disturbed. He has a follow-up appointment with Dr. Keturah Barre nephrologist next week 06/07/2018; the patient arrives today with a history that he took both his compression wraps off 3 days ago in order to take a shower. He has bilateral severe tense blisters. A lot of weeping erythema especially on the lateral left calf. Almost circumferential blisters on the right. Multiple areas of epithelial breakdown. He does not complain of any pain fever chills. He states he has been using his compression pump in some form of stocking that I could not really determine the type. He did not come into the clinic with anything on his legs. He tells me he has an appointment with Dr. Merita Norton his nephrologist I believe this Friday. Paradoxically his weight is actually down to 385 pounds I believe last week he was over 400 06/14/18; arrives with better looking wound surfaces today on both legs. There is no further blistering however there are still areas that aren't epithelialized on the right anterior, right lateral, left posterior and left medial. His original wound just above the left ankle laterally is still open moist. His weight was about the same today. He tells me that  his nephrologist increase the torsemide from 40/20/09/1939/40 twice a day 06/21/18; both his wraps fell down to his mid calf. Has a result he has multiple blisters across the anterior right leg above with the wraps ultimately watched. He also has blisters on the posterior part of the left calf o2. His original wound on the left lateral calf is hard to see any open area. There is however a divot. Which is going to be difficult to deal with into the future until the edema in the left leg is controlled. 2 small superficial areas remain He tells [place] his weight was 386 pounds on his scale at home. According to our scale he is up 5 pounds. He is not on a fluid restriction 06/28/18; patient's weight is gone up 2 pounds since last time. He has weeping edema and open superficial wounds on the right posterior right lateral and right anterior calf. He has a small open area on the left anterior probably left posterior calf and the original wound on the left lateral calf appears to be just about closed He is being planned for a dialysis shunt in the right arm through [facility] incoordination with his nephrologist Dr. [name] He claims to be using his compression pumps twice a day. He has lymphedema and no doubt systemic fluid volume overload from stage IV chronic renal failure 07/04/18; patient's weight is up into the 396 range. He is supposed to see his cardiologist tomorrow and plans are being made for a shunt by Dr. Delana Meyer. He still has significant open wounds/draining areas on the right anterior and right posterior calf. Several small open areas on the left which are less in terms of wound area on the right which is surprising given the fact the left is the larger most lymphedematous leg 07/26/2018 She is seen today for follow-up and management of lateral posterior lower leg wound and bilateral  lymphoedema. He has a very flat affect and depressed mood today. Unable to elicit much information from  him today. No thoughts of harm to self identified. Recently had a follow-up with vascular vein for fistula placement to initiate dialysis in the right arm. He has 2 dressings on the right arm from the fistula procedure with no bleeding or drainage noted on the dressing. He does have a large amount of bruising in the surrounding to puncture areas on the right lower arm from the fistula placement. No pain elicited with palpation of the right arm. He denies a diminished sensation in that right lower arm as well. He does have a small wound to the left lateral posterior lower leg. No visual drainage present to either leg. However the wound to the left lower leg does have a foul odor even after cleaning it. The left is the larger most lymphedematous leg with the right Leg still having a significant amount of edema. No drainage or blisters present on either leg today. He states that he is using the lymphedema pumps twice a day. Not too sure if he is compliant with actually using the lymphedema pumps based on the amount of edema that he has in his legs. Weight increased from 396.1 to 401.6. No recent fevers, chills, or shortness of breath. 08/02/18; the patient only has one remaining wound on the left lateral calf which is still open. This is in the resultant [place] shaped area which was once the site of the major wound. He does not have any open areas on the right leg nor additional wounds on the left no blistering. As usual the left leg is much larger than the right. Napp, Dann A. ([id number]) He comes in today stating that he took the wraps off on the right leg on Friday and since then he has presumably been wearing his stocking which is a wraparound variant stocking on the right. States he took the 4-layer compression off his left leg this morning to shower. He is seeing vein and vascular tomorrow. He states he is using his compression pumps once or twice a day. He also admits that he has been on his  feet a lot Objective Constitutional Patient is hypertensive.. Pulse regular and within target range for patient.[name] Respirations regular, non-labored and within target range.. Temperature is normal and within the target range for the patient.. Vitals Time Taken: 1:04 PM, Height: 76 in, Weight: 401.6 lbs, BMI: 48.9, Temperature: 98.1 F, Pulse: 64 bpm, Respiratory Rate: 18 breaths/min, Blood Pressure: 179/75 mmHg. Eyes Conjunctivae clear. No discharge. Respiratory Respiratory effort is easy and symmetric bilaterally. Rate is normal at rest and on room air.. Bilateral breath sounds are clear and equal in all lobes with no wheezes, rales or rhonchi.. Cardiovascular Heart rhythm and rate regular, without murmur or gallop.JVP is not elevated he has no coccyx edema. Pedal pulses absent bilaterally.. Severe nonpitting Edema left greater than right compatible with severe lymphedema.[name] Psychiatric Patient appears depressed today.. General Notes: Wound exam; left lateral posterior lower leg. There is a small remaining open area in the middle of what was a very large wound area. This is in the setting of overlying tissue creating a divot /[place] shaped wound area. This was once a very large opening down to very tiny centimeter sized openings. Base of these wounds look stable. Otherwise there is no open area on the right leg and no additional open area on his chronically enlarged left leg. Integumentary (Hair, Skin) He has a new  shunt placed in his right forearm. Bilateral secondary lymphedema left greater than right. Wound #2 status is Open. Original cause of wound was Gradually Appeared. The wound is located on the Left,Lateral,Posterior Lower Leg. The wound measures 4cm length x 1.4cm width x 0.1cm depth; 4.398cm^2 area and 0.44cm^3 volume. There is Fat Layer (Subcutaneous Tissue) Exposed exposed. There is no tunneling or undermining noted. There is no granulation within the wound bed. There is no  necrotic tissue within the wound bed. The periwound skin appearance did not exhibit: Callus, Crepitus, Excoriation, Induration, Rash, Scarring, Dry/Scaly, Maceration, Atrophie Blanche, Cyanosis, Ecchymosis, Hemosiderin Staining, Mottled, Pallor, Rubor, Erythema. Assessment Casebier, Halim A. ([id number]) Active Problems ICD-10 Non-pressure chronic ulcer of left calf with necrosis of muscle Lymphedema, not elsewhere classified Chronic venous hypertension (idiopathic) with ulcer and inflammation of left lower extremity Type 2 diabetes mellitus with other skin ulcer Non-pressure chronic ulcer of right calf limited to breakdown of skin Procedures #1 difficult situation. After discussion with the patient back and forth with the idea of just going to his stockings bilaterally we agreed to put him in stockings bilaterally however I want to continue the 4-layer compression on the left leg which we will send orders to home health which is coming out on Monday and Friday. The idea here is that he is seeing vein and vascular Tomorrow. They'll not take the wrap off the left leg #2 he claims to be using his compression pumps. #3 as usual looks depressed. Plan Wound Cleansing: Wound #2 Left,Lateral,Posterior Lower Leg: Clean wound with Normal Saline. Cleanse wound with mild soap and water Anesthetic (add to Medication List): Wound #2 Left,Lateral,Posterior Lower Leg: Topical Lidocaine 4% cream applied to wound bed prior to debridement (In Clinic Only). Primary Wound Dressing: Wound #2 Left,Lateral,Posterior Lower Leg: Silver Alginate - on all draining areas Secondary Dressing: Wound #2 Left,Lateral,Posterior Lower Leg: Other - absorbent dressing on all weeping areas Dressing Change Frequency: Wound #2 Left,Lateral,Posterior Lower Leg: Change Dressing Monday, Wednesday, Friday - Wednesdays in [facility] Follow-up Appointments: Wound #2 Left,Lateral,Posterior Lower Leg: Return Appointment in  1 week. Edema Control: Wound #2 Left,Lateral,Posterior Lower Leg: 4-Layer Compression System - Left Lower Extremity. Patient to wear own Velcro compression garment. - on Right leg Elevate legs to the level of the heart and pump ankles as often as possible Compression Pump: Use compression pump on left lower extremity for 30 minutes, twice daily. - 1 Hour three times Daily Compression Pump: Use compression pump on right lower extremity for 30 minutes, twice daily. - 1 Hour three times daily Home Health: Wound #2 Left,Lateral,Posterior Lower Leg: Continue Home Health Visits - [facility]: Monday, Wednesday and Friday. [name], [name] ([id number]) Home Health Nurse may visit PRN to address patient s wound care needs. FACE TO FACE ENCOUNTER: MEDICARE and MEDICAID PATIENTS: I certify that this patient is under my care and that I had a face-to-face encounter that meets the physician face-to-face encounter requirements with this patient on this date. The encounter with the patient was in whole or in part for the following MEDICAL CONDITION: (primary reason for [place]) MEDICAL NECESSITY: I certify, that based on my findings, NURSING services are a medically necessary home health service. HOME BOUND STATUS: I certify that my clinical findings support that this patient is homebound (i.e., Due to illness or injury, pt requires aid of supportive devices such as crutches, cane, wheelchairs, walkers, the use of special transportation or the assistance of another person to leave their place of residence.  There is a normal inability to leave the home and doing so requires considerable and taxing effort. Other absences are for medical reasons / religious services and are infrequent or of short duration when for other reasons). If current dressing causes regression in wound condition, may D/C ordered dressing product/s and apply Normal Saline Moist Dressing daily until next [place] / Other MD  appointment. [place] of regression in wound condition at [phone]. Please direct any NON-WOUND related issues/requests for orders to patient's Primary Care Physician Electronic Signature(s) Signed: 08/02/2018 4:50:38 PM By: Linton Ham MD Entered By: Linton Ham on 08/02/2018 14:14:40 Dolin, Clester A. ([id number]) -------------------------------------------------------------------------------- [place] Details Patient Name: [name], [name] Date of Service: 08/02/2018 Medical Record Number: [id number] Patient Account Number: [id number] Date of Birth/Sex: 26-Jul-1971 (47 y.o. M) Treating RN: Cornell Barman Primary Care Provider: Gaetano Net Other Clinician: Referring Provider: Gaetano Net Treating Provider/Extender: Tito Dine in Treatment: 31 Diagnosis Coding ICD-10 Codes Code Description 6101962791 Non-pressure chronic ulcer of left calf with necrosis of muscle I89.0 Lymphedema, not elsewhere classified I87.332 Chronic venous hypertension (idiopathic) with ulcer and inflammation of left lower extremity E11.622 Type 2 diabetes mellitus with other skin ulcer L97.211 Non-pressure chronic ulcer of right calf limited to breakdown of skin Facility Procedures CPT4 Code: [id number] Description: 99213 - WOUND CARE VISIT-LEV 3 EST PT Modifier: Quantity: 1 Physician Procedures CPT4 Code: 8828003 Description: 49179 - WC PHYS LEVEL 3 - EST PT ICD-10 Diagnosis Description L97.223 Non-pressure chronic ulcer of left calf with necrosis of mu L97.211 Non-pressure chronic ulcer of right calf limited to breakdo Modifier: scle wn of skin Quantity: 1 Electronic Signature(s) Signed: 08/02/2018 6:00:35 PM By: Gretta Cool, BSN, RN, CWS, Kim RN, BSN Previous Signature: 08/02/2018 4:50:38 PM Version By: Linton Ham MD Entered By: Gretta Cool, BSN, RN, CWS, Kim on 08/02/2018 18:00:34

## 2018-08-06 ENCOUNTER — Encounter (INDEPENDENT_AMBULATORY_CARE_PROVIDER_SITE_OTHER): Payer: Self-pay | Admitting: Vascular Surgery

## 2018-08-09 ENCOUNTER — Encounter: Payer: BLUE CROSS/BLUE SHIELD | Admitting: Internal Medicine

## 2018-08-09 DIAGNOSIS — E11622 Type 2 diabetes mellitus with other skin ulcer: Secondary | ICD-10-CM | POA: Diagnosis not present

## 2018-08-11 NOTE — Progress Notes (Signed)
[name], [name] ([id number]) Visit Report for 08/09/2018 HPI Details Patient Name: [name], [name] Date of Service: 08/09/2018 10:00 AM Medical Record Number: [id number] Patient Account Number: [id number] Date of Birth/Sex: April 21, 1971 (47 y.o. M) Treating RN: Cornell Barman Primary Care Provider: Gaetano Net Other Clinician: Referring Provider: Gaetano Net Treating Provider/Extender: Tito Dine in Treatment: 32 History of Present Illness HPI Description: 12/28/17; this is a now 47 year old man who is a type II diabetic. He was hospitalized from 10/01/17 through 10/19/17. He had an MSSA soft tissue and skin infection. 2 open areas on the left leg were identified he has a smaller area on the left medial calf superiorly just below the knee and a wound just above the left ankle on the posterior medial aspect. I think both of these were surgical IandD sites when he was in the hospital. He was discharged with a wound VAC at that point however this is since been taken off. He follows with Dr. Ola Spurr for the [facility] and he is still on chronic Keflex at 500 twice a day. At that time he was hospitalized his hemoglobin A1c was 15.1 however if I'm reading his endocrinologist notes correctly that is improved. He has been following with Dr. [name] at vein and vascular and he has been applying calcium alginate and Unna boots. He has home health changing the dressing. They have also been attempting to get him external compression pumps although the patient is unaware whether they've been approved by insurance at this point. as mentioned he has a smaller clean wound on the right lateral calf just below the knee and he has a much larger area just above the left ankle medially and posteriorly. Our intake nurse reported greenish purulent looking drainage.the patient did have surgical material sent to pathology in February. This showed chronic abscess The patient also has lymphedema stage III in the left  greater than right lower extremities. He has a history of blisters with wounds but these of all were always healed. The patient thinks that the lymphedema may have been present since he was about 47 years old i.e. about 30 years. He does not have graded pressure stockings and has not worn stockings. He does not have a distant history of DVT PE or phlebitis. He has not been systemically unwell fever no chills. He states that his Lasix is recently been reduced. He tells me his kidney function is at "30%" and he has been followed by Dr. [name] of nephrology. At one point he was on Lasix 80 twice a day however that's been cut back and he is now on Lasix at 20 twice a day. The patient has a history of PAD listed in his records although he comes from Dr. [name] I don't think is felt to have significant PAD. ABIs in our clinic were noncompressible bilaterally. 01/04/18; patient has a large wound on the left lateral lower calf and a small wound on the left medial upper calf. He has been to see his nephrologist who changed him to Demadex 40 mg a day. I'm hopeful this will help with his systemic fluid overload. He also has stage III lymphedema. Really no change in the 2 wounds since last week 01/11/18; the patient is down 13 pounds. He put his stage III lymphedema left leg in 4K compression last week and there is less edema fluid however we still haven't been able to communicate with home health but apparently it is [facility] but the dressings have not been changed. The  patient noted an odor last week. He is also had compression pumps ordered by [facility] this as a not completed the paperwork stage. 01/18/18; patient continues to lose weight. Stage III lymphedema in the left greater than right leg under for alert compression. The major wound is on the left lateral ankle area. He apparently has bilateral compression pumps being brought to his house, these were ordered by  vein and  vascular Notable for the fact today he had some blisters on the right anterior leg together with some skin nodules. This is no doubt secondary to severe lymphedema. 01/25/18; the patient has obtained his compression pumps and is using them per vein and vascular instructions 3 times a day for an hour. He also saw [name] of vein and vascular. He was felt to have venous insufficiency but did not suggest any intervention also improved edema. It was suggested that he have compression stockings 20-30 mm on a daily basis in addition to compression pumps. Grimaldo, Knut A. ([id number]) The patient arrives in clinic today with a layer of unna under for layer compression. he seems to have some trouble with the degree of compression. He has open areas on the left lateral ankle area which is his major wound left upper medial calf and a superficial open area on the right anterior shin area which was blistered last week. He has skin changes on the right anterior calf which I think are no doubt secondary to lymphedema skin nodules etc. 02/01/18; the patient comes in telling us his nephrologist have to his torsemide. Unfortunately today he is put on 7 pounds by our scales. He has blisters all over the anterior and medial part of his right calf and a new open wound. He also has soupy green drainage coming out of the left lateral calf /ankle wound. 02/08/18; culture I did last week of the left lateral ankle wound grew both Pseudomonas and Morganella. He is on Keflex from Dr. Ola Spurr in the hospital. I will need to review these notes.in any case Keflex is not going to cover these 2 organisms. I'm probably going to added ciprofloxacin today for 1 week. A lot of drainage that looks purulent last week. He is not complaining of pain however he has managed to put on 10 pounds in 2 weeks by our scales in this clinic. He is going to see his nephrologist tomorrow 02/15/18; he completed the ciprofloxacin I gave him last week.  Notable that he is up to 379 pounds today which is up 16 pounds from 2 weeks ago. He is complaining of orthopnea but doesn't have any chest pain. 02/22/18; he continues to have weight gain. R intake nurse reports again purulent green drainage coming out of the lateral wound on the lateral left calf. He has small open area on the right anterior leg. His torsemide was increased to 2 tablets a day I believe this is 40 mg last week in response to the call admitted to Dr. Keturah Barre office. He follows up with Dr. [name] and Dr. Delana Meyer tomorrow 03/01/18 his weight essentially stable today at 383 pounds. Drainage out of the left lateral wound on the lower left calf/ankle is a lot less. Culture last time grew Pseudomonas. I put him on cefdinir. He has been to Dr. Keturah Barre office no adjustments in his diuretics. Dr. Nicoletta Dress prescribed a wraparound stocking for the right leg.there is no open area on the right leg. He has a superficial area on the left medial calf, left posterior  calf and in the large area on the left lateral however this looks better 03/15/18; weight is not up to 393 pounds. He saw his nephrologist yesterday Dr. [name] will increase the Demadex I'm hopeful this will help with the edema control. I'm using silver alginate to all his wounds. In particular the left lateral ankle looks better. oUnfortunately he has new open areas on the right lateral calf that will include use of his compression stocking at least in the short to medium term. He has new wounds o3 on the right lateral calf. One of these has some size however all numerous superficial 03/22/18; his weight is stabilized a bit. Just adjustment of his diuretics by his nephrologist. There is no doubt he has some degree of systemic fluid overload on top of severe left greater than right lymphedema. 2 weeks ago tried to transition him to stockings on the right leg however he developed re-breakdown of skin on the right leg and we had to put him back  in compression last week. He also uses external compression pumps and claims to be compliant Continued concern about his depression today 03/29/18; several ongoing issues with this patient; oHe no longer has home health coverage apparently secondary to a lapse in insurance. He is apparently transitioning from short for long-term disability. [facility] was changing his compression wraps on Monday and Friday. oHe has gained 10 pounds since last week oSees Dr. [name] tomorrow oSaw his primary doctor last week about the depression. It sounds as though he declined pharmacologic management. He seems somewhat better today. We were concerned last week when he came. Somewhat better today oHe is using his compression pumps once a day at home, I have asked for twice a day if possible especially on the left leg oFollows up with his nephrologist in mid-August. He is managing his diuretic for I think stage IV chronic renal failure oParadoxically his wounds actually look better 04/19/18; the patient has not been seen since I last saw him 3 weeks ago. He saw Dr. Delana Meyer of vascular surgery on 03/30/18 I believe he put him in a 20/30 stocking bilaterally with a wraparound extremitease stocking. He has not been putting anything specifically on the wound. More problematic than that he has not been wearing the stockings he is at home. He has been using his external compression pumps once per day according to him on a rare occasion twice On a psychosocial level the patient is now on long-term disability and is applying for COBRA therefore he is between insurances. He has not been able to follow up with Dr. [name] who is his nephrologist as a result. As noted his weight is up to 407 pounds today. He promises me he'll follow-up with Dr. [name] 05/03/18; he hasn't been here in 2 weeks now. He apparently has been wearing a compression sock on the left leg. Massive increase in edema 3 large open wounds on the left anterior  leg that were probably blisters. Significant deterioration in the left lateral ankle wound that we've been doing as his most problematic wound. He still does not have his insurance issues wrapped up. His weight is well over 400 pounds. 05/10/18; arrives today with better looking edema control in the left leg. He has been using his compression pumps twice a day. We also wrapped it is left leg for there he's been using his pumps so there is much better edema control. Most of new Switalski, Sina A. ([id number]) wounds from last week look a  lot better. Even the refractory area on the lower left lateral ankle looks a lot better to me today. He follows up with Dr. [name] this morning [nephrology] 05/17/18 patient arrives today with a lot less edema in the left leg. His weight is gone down 7 pounds. He tells me he saw Dr. [name] but his torsemide was not adjusted. He is using the palms 3 times a day. There is been quite an improvement in the remaining wound on the left lateral ankle oWeekly visit for follow-up of bilateral lower extremity wounds related to severe lymphedema and probably some degree of systemic fluid overload from chronic renal failure stage IV. His weight is up this week to over 400 pounds. He noted increasing edema in the right leg late last week. He took his compression stocking off he did not increase the frequency of this compression pump use. He developed a large blister on the back of the right calf. He also has a new opened blister on the left lateral calf in addition to the wound that we've been using on the distal left lower calf area and we've been using silver alginate. He tells me that he has an ultrasound which is a DVT rule out and I think a reflux study ordered by Dr. [name] 05/31/18; weekly visit. He went to see vein and vascular this week apparently they remove the 4-layer compression on the left and put an Unna boot on him in replacement. He's got more swelling in the left leg  is resolved. That being said his left lower Wound is better. He still has a fairly large wound on the right posterior calf this was not disturbed. He has a follow-up appointment with Dr. Keturah Barre nephrologist next week 06/07/2018; the patient arrives today with a history that he took both his compression wraps off 3 days ago in order to take a shower. He has bilateral severe tense blisters. A lot of weeping erythema especially on the lateral left calf. Almost circumferential blisters on the right. Multiple areas of epithelial breakdown. He does not complain of any pain fever chills. He states he has been using his compression pump in some form of stocking that I could not really determine the type. He did not come into the clinic with anything on his legs. He tells me he has an appointment with Dr. Merita Norton his nephrologist I believe this Friday. Paradoxically his weight is actually down to 385 pounds I believe last week he was over 400 06/14/18; arrives with better looking wound surfaces today on both legs. There is no further blistering however there are still areas that aren't epithelialized on the right anterior, right lateral, left posterior and left medial. His original wound just above the left ankle laterally is still open moist. His weight was about the same today. He tells me that his nephrologist increase the torsemide from 40/20/09/1939/40 twice a day 06/21/18; both his wraps fell down to his mid calf. Has a result he has multiple blisters across the anterior right leg above with the wraps ultimately watched. He also has blisters on the posterior part of the left calf o2. His original wound on the left lateral calf is hard to see any open area. There is however a divot. Which is going to be difficult to deal with into the future until the edema in the left leg is controlled. 2 small superficial areas remain He tells [place] his weight was 386 pounds on his scale at home. According to our scale he  is up 5 pounds. He is not on a fluid restriction 06/28/18; patient's weight is gone up 2 pounds since last time. He has weeping edema and open superficial wounds on the right posterior right lateral and right anterior calf. He has a small open area on the left anterior probably left posterior calf and the original wound on the left lateral calf appears to be just about closed He is being planned for a dialysis shunt in the right arm through [facility] incoordination with his nephrologist Dr. [name] He claims to be using his compression pumps twice a day. He has lymphedema and no doubt systemic fluid volume overload from stage IV chronic renal failure 07/04/18; patient's weight is up into the 396 range. He is supposed to see his cardiologist tomorrow and plans are being made for a shunt by Dr. Delana Meyer. He still has significant open wounds/draining areas on the right anterior and right posterior calf. Several small open areas on the left which are less in terms of wound area on the right which is surprising given the fact the left is the larger most lymphedematous leg 07/26/2018 She is seen today for follow-up and management of lateral posterior lower leg wound and bilateral lymphoedema. He has a very flat affect and depressed mood today. Unable to elicit much information from him today. No thoughts of harm to self identified. Recently had a follow-up with vascular vein for fistula placement to initiate dialysis in the right arm. He has 2 dressings on the right arm from the fistula procedure with no bleeding or drainage noted on the dressing. He does have a large amount of bruising in the surrounding to puncture areas on the right lower arm from the fistula placement. No pain elicited with palpation of the right arm. He denies a diminished sensation in that right lower arm as well. He does have a small wound to the left lateral posterior lower leg. No visual drainage present to either leg.  However the wound to the left lower leg does have a foul odor even after cleaning it. The left is the larger most lymphedematous leg with the right Leg still having a significant amount of edema. No drainage or blisters present on either leg today. He states that he is using the lymphedema pumps twice a day. Not too sure if he is compliant with actually using the lymphedema pumps based on the amount of edema that he has in his legs. Weight increased from 396.1 to 401.6. No recent fevers, chills, or shortness of breath. 08/02/18; the patient only has one remaining wound on the left lateral calf which is still open. This is in the resultant [facility] [place]. ([id number]) shaped area which was once the site of the major wound. He does not have any open areas on the right leg nor additional wounds on the left no blistering. As usual the left leg is much larger than the right. He comes in today stating that he took the wraps off on the right leg on Friday and since then he has presumably been wearing his stocking which is a wraparound variant stocking on the right. States he took the 4-layer compression off his left leg this morning to shower. He is seeing vein and vascular tomorrow. He states he is using his compression pumps once or twice a day. He also admits that he has been on his feet a lot 08/09/18; the patient has the left leg healed except for the original wound site on  the left medial Just above the ankle. On the right he has a new wound on the right lateral calf. He saw vein and vascular last week and he is been back in his pressure stockings and wraparound stockings. He also is been put on metolazone 3 times a week by nephrology along with his torsemide. I'm hopeful that this will help with some of the lower extremity edema. I've always felt that he has lymphedema but there may be a secondary component contributing Electronic Signature(s) Signed: 08/09/2018 4:51:45 PM By: Linton Ham  MD Entered By: Linton Ham on 08/09/2018 11:18:57 Bayle, Quay A. ([id number]) -------------------------------------------------------------------------------- Physical Exam Details Patient Name: [name], [name] Date of Service: 08/09/2018 10:00 AM Medical Record Number: [id number] Patient Account Number: [id number] Date of Birth/Sex: 24-Jul-1971 (47 y.o. M) Treating RN: Cornell Barman Primary Care Provider: Gaetano Net Other Clinician: Referring Provider: Gaetano Net Treating Provider/Extender: Tito Dine in Treatment: 8 Constitutional Patient is hypertensive.. Pulse regular and within target range for patient.[name] Respirations regular, non-labored and within target range.. Temperature is normal and within the target range for the patient.[name] appears in no distress. Eyes Conjunctivae clear. No discharge. Respiratory Respiratory effort is easy and symmetric bilaterally. Rate is normal at rest and on room air.[name] Lymphatic . Integumentary (Hair, Skin) Lymphedema in the left greater than right leg. Edema is nonpitting. Psychiatric Patient appears depressed today.However this seemed better than in previous weeks. Notes Wound exam; left posterior lateral lower leg. Still a small open area here. This isn't a deep crevice of edematous tissue. This will be difficult to close and keep closed although we've made a lot of progress - he has a new open area on the right lateral calf is a small,With a healthy granulated base Electronic Signature(s) Signed: 08/09/2018 4:51:45 PM By: Linton Ham MD Entered By: Linton Ham on 08/09/2018 11:22:05 Delaughter, Korbyn AMarland Kitchen ([id number]) -------------------------------------------------------------------------------- Physician Orders Details Patient Name: Gates, Lief A. Date of Service: 08/09/2018 10:00 AM Medical Record Number: [id number] Patient Account Number: [id number] Date of Birth/Sex: March 08, 1971 (47 y.o. M) Treating RN: Cornell Barman Primary Care Provider: Gaetano Net Other Clinician: Referring Provider: Gaetano Net Treating Provider/Extender: Tito Dine in Treatment: 46 Verbal / Phone Orders: No Diagnosis Coding Wound Cleansing Wound #17 Right,Lateral Lower Leg o Clean wound with Normal Saline. o Cleanse wound with mild soap and water Wound #2 Left,Lateral,Posterior Lower Leg o Clean wound with Normal Saline. o Cleanse wound with mild soap and water Anesthetic (add to Medication List) Wound #17 Right,Lateral Lower Leg o Topical Lidocaine 4% cream applied to wound bed prior to debridement (In Clinic Only). Wound #2 Left,Lateral,Posterior Lower Leg o Topical Lidocaine 4% cream applied to wound bed prior to debridement (In Clinic Only). Skin Barriers/Peri-Wound Care Wound #2 Left,Lateral,Posterior Lower Leg o Barrier cream Primary Wound Dressing Wound #17 Right,Lateral Lower Leg o Silver Alginate - on all draining areas Wound #2 Left,Lateral,Posterior Lower Leg o Silver Alginate - on all draining areas Secondary Dressing Wound #17 Right,Lateral Lower Leg o Boardered Foam Dressing Wound #2 Left,Lateral,Posterior Lower Leg o Boardered Foam Dressing Dressing Change Frequency Wound #2 Left,Lateral,Posterior Lower Leg o Change Dressing Monday, Wednesday, Friday - Wednesdays in [facility] Follow-up Appointments Wound #17 Right,Lateral Lower Leg o Return Appointment in 1 week. Rance, Axcel A. ([id number]) Wound #2 Left,Lateral,Posterior Lower Leg o Return Appointment in 1 week. Edema Control Wound #17 Right,Lateral Lower Leg o Patient to wear own Velcro compression garment. o Elevate legs to the level of the  heart and pump ankles as often as possible o Compression Pump: Use compression pump on left lower extremity for 30 minutes, twice daily. - 1 Hour three times Daily o Compression Pump: Use compression pump on right lower extremity for 30  minutes, twice daily. - 1 Hour three times daily Wound #2 Left,Lateral,Posterior Lower Leg o Patient to wear own Velcro compression garment. o Elevate legs to the level of the heart and pump ankles as often as possible o Compression Pump: Use compression pump on left lower extremity for 30 minutes, twice daily. - 1 Hour three times Daily o Compression Pump: Use compression pump on right lower extremity for 30 minutes, twice daily. - 1 Hour three times daily Home Health Wound #17 Right,Lateral Lower Leg o Continue Home Health Visits - [facility]: Monday, Wednesday and Friday. o Home Health Nurse may visit PRN to address patientos wound care needs. o FACE TO FACE ENCOUNTER: MEDICARE and MEDICAID PATIENTS: I certify that this patient is under my care and that I had a face-to-face encounter that meets the physician face-to-face encounter requirements with this patient on this date. The encounter with the patient was in whole or in part for the following MEDICAL CONDITION: (primary reason for [place]) MEDICAL NECESSITY: I certify, that based on my findings, NURSING services are a medically necessary home health service. HOME BOUND STATUS: I certify that my clinical findings support that this patient is homebound (i.e., Due to illness or injury, pt requires aid of supportive devices such as crutches, cane, wheelchairs, walkers, the use of special transportation or the assistance of another person to leave their place of residence. There is a normal inability to leave the home and doing so requires considerable and taxing effort. Other absences are for medical reasons / religious services and are infrequent or of short duration when for other reasons). o If current dressing causes regression in wound condition, may D/C ordered dressing product/s and apply Normal Saline Moist Dressing daily until next [place] / Other MD appointment. [place] of  regression in wound condition at [phone]. o Please direct any NON-WOUND related issues/requests for orders to patient's Primary Care Physician Wound #2 [place] Visits - [facility]: Monday, Wednesday and Friday. o Home Health Nurse may visit PRN to address patientos wound care needs. o FACE TO FACE ENCOUNTER: MEDICARE and MEDICAID PATIENTS: I certify that this patient is under my care and that I had a face-to-face encounter that meets the physician face-to-face encounter requirements with this patient on this date. The encounter with the patient was in whole or in part for the following MEDICAL CONDITION: (primary reason for [place]) MEDICAL NECESSITY: I certify, that based on my findings, NURSING services are a medically necessary home health service. HOME BOUND STATUS: I certify that my clinical findings support that this patient is homebound (i.e., Due to illness or injury, pt requires aid of supportive devices such as crutches, cane, wheelchairs, walkers, the use of special transportation or the assistance of another person to leave their place of residence. There is a normal inability to leave the home and doing so requires considerable and taxing effort. Other absences are for medical reasons / religious services and are infrequent or of short duration when for other reasons). o If current dressing causes regression in wound condition, may D/C ordered dressing product/s and apply Normal Saline Moist Dressing daily until next [place] / Other MD appointment. [place]  of regression in wound condition at [phone]. o Please direct any NON-WOUND related issues/requests for orders to patient's Primary Care Physician FELICIA, BOTH ([id number]) Electronic Signature(s) Signed: 08/09/2018 4:51:45 PM By: Linton Ham MD Signed: 08/09/2018 5:09:03 PM By: Gretta Cool, BSN, RN, CWS, Kim RN,  BSN Entered By: Gretta Cool, BSN, RN, CWS, Kim on 08/09/2018 10:48:17 Eisen, Rajon AMarland Kitchen ([id number]) -------------------------------------------------------------------------------- Problem List Details Patient Name: [name], [name] Date of Service: 08/09/2018 10:00 AM Medical Record Number: [id number] Patient Account Number: [id number] Date of Birth/Sex: [birth year] (47 y.o. 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 ([id number]) -------------------------------------------------------------------------------- Progress Note Details Patient Name: Jastrzebski, Izacc A. Date of Service: 08/09/2018 10:00 AM Medical Record Number: [id number] Patient Account Number: [id number] Date of Birth/Sex: [birth year] (47 y.o. M) Treating RN: Cornell Barman Primary Care Provider: Gaetano Net Other Clinician: Referring Provider: Gaetano Net Treating Provider/Extender: Tito Dine in Treatment: 32 Subjective History of Present Illness (HPI) 12/28/17; this is a now 47 year old man who is a type II diabetic. He was hospitalized from 10/01/17 through 10/19/17. He had an MSSA soft  tissue and skin infection. 2 open areas on the left leg were identified he has a smaller area on the left medial calf superiorly just below the knee and a wound just above the left ankle on the posterior medial aspect. I think both of these were surgical IandD sites when he was in the hospital. He was discharged with a wound VAC at that point however this is since been taken off. He follows with Dr. Ola Spurr for the [facility] and he is still on chronic Keflex at 500 twice a day. At that time he was hospitalized his hemoglobin A1c was 15.1 however if I'm reading his endocrinologist notes correctly that is improved. He has been following with Dr. [name] at vein [facility] vascular and he has been applying calcium alginate and Unna boots. He has home health changing the dressing. They have also been attempting to get him external compression pumps although the patient is unaware whether they've been approved by insurance at this point. as mentioned he has a smaller clean wound on the right lateral calf just below the knee and he has a much larger area just above the left ankle medially and posteriorly. Our intake nurse reported greenish purulent looking drainage.the patient did have surgical material sent to pathology in February. This showed chronic abscess The patient also has lymphedema stage III in the left greater than right lower extremities. He has a history of blisters with wounds but these of all were always healed. The patient thinks that the lymphedema may have been present since he was about 48 years old i.e. about 30 years. He does not have graded pressure stockings and has not worn stockings. He does not have a distant history of DVT PE or phlebitis.  He has not been systemically unwell fever no chills. He states that his Lasix is recently been reduced. He tells me his kidney function is at "30%" and he has been followed by Dr. [name] of nephrology. At one point he was on Lasix 80 twice a day however  that's been cut back and he is now on Lasix at 20 twice a day. The patient has a history of PAD listed in his records although he comes from Dr. [name] I don't think is felt to have significant PAD. ABIs in our clinic were noncompressible bilaterally. 01/04/18; patient has a large wound on the left lateral lower calf and a small wound on the left medial upper calf. He has been to see his nephrologist who changed him to Demadex 40 mg a day. I'm hopeful this will help with his systemic fluid overload. He also has stage III lymphedema. Really no change in the 2 wounds since last week 01/11/18; the patient is down 13 pounds. He put his stage III lymphedema left leg in 4K compression last week and there is less edema fluid however we still haven't been able to communicate with home health but apparently it is [facility] but the dressings have not been changed. The patient noted an odor last week. He is also had compression pumps ordered by [facility] this as a not completed the paperwork stage. 01/18/18; patient continues to lose weight. Stage III lymphedema in the left greater than right leg under for alert compression. The major wound is on the left lateral ankle area. He apparently has bilateral compression pumps being brought to his house, these were ordered by [facility] Notable for the fact today he had some blisters on the right anterior leg together with some skin nodules. This is no doubt secondary to severe lymphedema. 01/25/18; the patient has obtained his compression pumps and is using them per vein and vascular instructions 3 times a day for an hour. He also saw [name] of vein and vascular. He was felt to have venous insufficiency but did not suggest any intervention also improved edema. It was suggested that he have compression stockings 20-30 mm on a daily basis in addition to compression pumps. The patient arrives in clinic today with a layer of unna under  for layer compression. he seems to have some trouble with the degree of compression. He has open areas on the left lateral ankle area which is his major wound left upper medial calf and a Eskew, Costas A. ([id number]) superficial open area on the right anterior shin area which was blistered last week. He has skin changes on the right anterior calf which I think are no doubt secondary to lymphedema skin nodules etc. 02/01/18; the patient comes in telling us his nephrologist have to his torsemide. Unfortunately today he is put on 7 pounds by our scales. He has blisters all over the anterior and medial part of his right calf and a new open wound. He also has soupy green drainage coming out of the left lateral calf /ankle wound. 02/08/18; culture I did last week of the left lateral ankle wound grew both Pseudomonas and Morganella. He is on Keflex from Dr. Ola Spurr in the hospital. I will need to review these notes.in any case Keflex is not going to cover these 2 organisms. I'm probably going to added ciprofloxacin today for 1 week. A lot of drainage that looks purulent last week. He is not complaining of pain however he has managed  to put on 10 pounds in 2 weeks by our scales in this clinic. He is going to see his nephrologist tomorrow 02/15/18; he completed the ciprofloxacin I gave him last week. Notable that he is up to 379 pounds today which is up 16 pounds from 2 weeks ago. He is complaining of orthopnea but doesn't have any chest pain. 02/22/18; he continues to have weight gain. R intake nurse reports again purulent green drainage coming out of the lateral wound on the lateral left calf. He has small open area on the right anterior leg. His torsemide was increased to 2 tablets a day I believe this is 40 mg last week in response to the call admitted to Dr. Keturah Barre office. He follows up with Dr. [name] and Dr. Delana Meyer tomorrow 03/01/18 his weight essentially stable today at 383 pounds. Drainage out of the  left lateral wound on the lower left calf/ankle is a lot less. Culture last time grew Pseudomonas. I put him on cefdinir. He has been to Dr. Keturah Barre office no adjustments in his diuretics. Dr. Nicoletta Dress prescribed a wraparound stocking for the right leg.there is no open area on the right leg. He has a superficial area on the left medial calf, left posterior calf and in the large area on the left lateral however this looks better 03/15/18; weight is not up to 393 pounds. He saw his nephrologist yesterday Dr. [name] will increase the Demadex I'm hopeful this will help with the edema control. I'm using silver alginate to all his wounds. In particular the left lateral ankle looks better. Unfortunately he has new open areas on the right lateral calf that will include use of his compression stocking at least in the short to medium term. He has new wounds o3 on the right lateral calf. One of these has some size however all numerous superficial 03/22/18; his weight is stabilized a bit. Just adjustment of his diuretics by his nephrologist. There is no doubt he has some degree of systemic fluid overload on top of severe left greater than right lymphedema. 2 weeks ago tried to transition him to stockings on the right leg however he developed re-breakdown of skin on the right leg and we had to put him back in compression last week. He also uses external compression pumps and claims to be compliant Continued concern about his depression today 03/29/18; several ongoing issues with this patient; He no longer has home health coverage apparently secondary to a lapse in insurance. He is apparently transitioning from short for long-term disability. [facility] was changing his compression wraps on Monday and Friday. He has gained 10 pounds since last week Sees Dr. [name] tomorrow Saw his primary doctor last week about the depression. It sounds as though he declined pharmacologic management. He seems somewhat better  today. We were concerned last week when he came. Somewhat better today He is using his compression pumps once a day at home, I have asked for twice a day if possible especially on the left leg Follows up with his nephrologist in mid-August. He is managing his diuretic for I think stage IV chronic renal failure Paradoxically his wounds actually look better 04/19/18; the patient has not been seen since I last saw him 3 weeks ago. He saw Dr. Delana Meyer of vascular surgery on 03/30/18 I believe he put him in a 20/30 stocking bilaterally with a wraparound extremitease stocking. He has not been putting anything specifically on the wound. More problematic than that he has not been  wearing the stockings he is at home. He has been using his external compression pumps once per day according to him on a rare occasion twice On a psychosocial level the patient is now on long-term disability and is applying for COBRA therefore he is between insurances. He has not been able to follow up with Dr. [name] who is his nephrologist as a result. As noted his weight is up to 407 pounds today. He promises me he'll follow-up with Dr. [name] 05/03/18; he hasn't been here in 2 weeks now. He apparently has been wearing a compression sock on the left leg. Massive increase in edema 3 large open wounds on the left anterior leg that were probably blisters. Significant deterioration in the left lateral ankle wound that we've been doing as his most problematic wound. He still does not have his insurance issues wrapped up. His weight is well over 400 pounds. 05/10/18; arrives today with better looking edema control in the left leg. He has been using his compression pumps twice a day. We also wrapped it is left leg for there he's been using his pumps so there is much better edema control. Most of new wounds from last week look a lot better. Even the refractory area on the lower left lateral ankle looks a lot better to me today. He follows up  with Dr. [name] this morning [nephrology] 05/17/18 patient arrives today with a lot less edema in the left leg. His weight is gone down 7 pounds. He tells me he saw Dr. Helene Kelp, Niall A. ([id number]) [name] but his torsemide was not adjusted. He is using the palms 3 times a day. There is been quite an improvement in the remaining wound on the left lateral ankle Weekly visit for follow-up of bilateral lower extremity wounds related to severe lymphedema and probably some degree of systemic fluid overload from chronic renal failure stage IV. His weight is up this week to over 400 pounds. He noted increasing edema in the right leg late last week. He took his compression stocking off he did not increase the frequency of this compression pump use. He developed a large blister on the back of the right calf. He also has a new opened blister on the left lateral calf in addition to the wound that we've been using on the distal left lower calf area and we've been using silver alginate. He tells me that he has an ultrasound which is a DVT rule out and I think a reflux study ordered by Dr. [name] 05/31/18; weekly visit. He went to see vein and vascular this week apparently they remove the 4-layer compression on the left and put an Unna boot on him in replacement. He's got more swelling in the left leg is resolved. That being said his left lower Wound is better. He still has a fairly large wound on the right posterior calf this was not disturbed. He has a follow-up appointment with Dr. Keturah Barre nephrologist next week 06/07/2018; the patient arrives today with a history that he took both his compression wraps off 3 days ago in order to take a shower. He has bilateral severe tense blisters. A lot of weeping erythema especially on the lateral left calf. Almost circumferential blisters on the right. Multiple areas of epithelial breakdown. He does not complain of any pain fever chills. He states he has been using his  compression pump in some form of stocking that I could not really determine the type. He did not come into the clinic  with anything on his legs. He tells me he has an appointment with Dr. Merita Norton his nephrologist I believe this Friday. Paradoxically his weight is actually down to 385 pounds I believe last week he was over 400 06/14/18; arrives with better looking wound surfaces today on both legs. There is no further blistering however there are still areas that aren't epithelialized on the right anterior, right lateral, left posterior and left medial. His original wound just above the left ankle laterally is still open moist. His weight was about the same today. He tells me that his nephrologist increase the torsemide from 40/20/09/1939/40 twice a day 06/21/18; both his wraps fell down to his mid calf. Has a result he has multiple blisters across the anterior right leg above with the wraps ultimately watched. He also has blisters on the posterior part of the left calf o2. His original wound on the left lateral calf is hard to see any open area. There is however a divot. Which is going to be difficult to deal with into the future until the edema in the left leg is controlled. 2 small superficial areas remain He tells [place] his weight was 386 pounds on his scale at home. According to our scale he is up 5 pounds. He is not on a fluid restriction 06/28/18; patient's weight is gone up 2 pounds since last time. He has weeping edema and open superficial wounds on the right posterior right lateral and right anterior calf. He has a small open area on the left anterior probably left posterior calf and the original wound on the left lateral calf appears to be just about closed He is being planned for a dialysis shunt in the right arm through [facility] incoordination with his nephrologist Dr. [name] He claims to be using his compression pumps twice a day. He has lymphedema and no doubt systemic fluid  volume overload from stage IV chronic renal failure 07/04/18; patient's weight is up into the 396 range. He is supposed to see his cardiologist tomorrow and plans are being made for a shunt by Dr. Delana Meyer. He still has significant open wounds/draining areas on the right anterior and right posterior calf. Several small open areas on the left which are less in terms of wound area on the right which is surprising given the fact the left is the larger most lymphedematous leg 07/26/2018 She is seen today for follow-up and management of lateral posterior lower leg wound and bilateral lymphoedema. He has a very flat affect and depressed mood today. Unable to elicit much information from him today. No thoughts of harm to self identified. Recently had a follow-up with vascular vein for fistula placement to initiate dialysis in the right arm. He has 2 dressings on the right arm from the fistula procedure with no bleeding or drainage noted on the dressing. He does have a large amount of bruising in the surrounding to puncture areas on the right lower arm from the fistula placement. No pain elicited with palpation of the right arm. He denies a diminished sensation in that right lower arm as well. He does have a small wound to the left lateral posterior lower leg. No visual drainage present to either leg. However the wound to the left lower leg does have a foul odor even after cleaning it. The left is the larger most lymphedematous leg with the right Leg still having a significant amount of edema. No drainage or blisters present on either leg today. He states that he  is using the lymphedema pumps twice a day. Not too sure if he is compliant with actually using the lymphedema pumps based on the amount of edema that he has in his legs. Weight increased from 396.1 to 401.6. No recent fevers, chills, or shortness of breath. 08/02/18; the patient only has one remaining wound on the left lateral calf which is still  open. This is in the resultant [place] shaped area which was once the site of the major wound. He does not have any open areas on the right leg nor additional wounds on the left no blistering. As usual the left leg is much larger than the right. [name], [name] ([id number]) He comes in today stating that he took the wraps off on the right leg on Friday and since then he has presumably been wearing his stocking which is a wraparound variant stocking on the right. States he took the 4-layer compression off his left leg this morning to shower. He is seeing vein and vascular tomorrow. He states he is using his compression pumps once or twice a day. He also admits that he has been on his feet a lot 08/09/18; the patient has the left leg healed except for the original wound site on the left medial Just above the ankle. On the right he has a new wound on the right lateral calf. He saw vein and vascular last week and he is been back in his pressure stockings and wraparound stockings. He also is been put on metolazone 3 times a week by nephrology along with his torsemide. I'm hopeful that this will help with some of the lower extremity edema. I've always felt that he has lymphedema but there may be a secondary component contributing Objective Constitutional Patient is hypertensive.. Pulse regular and within target range for patient.[name] Respirations regular, non-labored and within target range.. Temperature is normal and within the target range for the patient.[name] appears in no distress. Vitals Time Taken: 10:08 AM, Height: 76 in, Weight: 401.6 lbs, BMI: 48.9, Temperature: 98.0 F, Pulse: 60 bpm, Respiratory Rate: 18 breaths/min, Blood Pressure: 173/74 mmHg. Eyes Conjunctivae clear. No discharge. Respiratory Respiratory effort is easy and symmetric bilaterally. Rate is normal at rest and on room air.[name] Psychiatric Patient appears depressed today.However this seemed better than in previous weeks. General  Notes: Wound exam; left posterior lateral lower leg. Still a small open area here. This isn't a deep crevice of edematous tissue. This will be difficult to close and keep closed although we've made a lot of progress - he has a new open area on the right lateral calf is a small,With a healthy granulated base Integumentary (Hair, Skin) Lymphedema in the left greater than right leg. Edema is nonpitting. Wound #17 status is Open. Original cause of wound was Gradually Appeared. The wound is located on the Right,Lateral Lower Leg. The wound measures 0.6cm length x 1.5cm width x 0.2cm depth; 0.707cm^2 area and 0.141cm^3 volume. There is Fat Layer (Subcutaneous Tissue) Exposed exposed. There is no tunneling or undermining noted. There is a medium amount of serosanguineous drainage noted. The wound margin is flat and intact. There is small (1-33%) pink granulation within the wound bed. There is a medium (34-66%) amount of necrotic tissue within the wound bed including Adherent Slough. The periwound skin appearance exhibited: Dry/Scaly, Hemosiderin Staining. The periwound skin appearance did not exhibit: Callus, Crepitus, Excoriation, Induration, Rash, Scarring, Maceration, Atrophie Blanche, Cyanosis, Ecchymosis, Mottled, Pallor, Rubor, Erythema. Periwound temperature was noted as No Abnormality. Wound #2 status  is Open. Original cause of wound was Gradually Appeared. The wound is located on the Left,Lateral,Posterior Lower Leg. The wound measures 1.1cm length x 4cm width x 0.2cm depth; 3.456cm^2 area and 0.691cm^3 volume. There is Fat Layer (Subcutaneous Tissue) Exposed exposed. There is no tunneling or undermining noted. There is a medium amount of serosanguineous drainage noted. The wound margin is indistinct and nonvisible. There is no granulation within the wound bed. There is no necrotic tissue within the wound bed. The periwound skin appearance did [name], [name] ([id number]) not exhibit: Callus,  Crepitus, Excoriation, Induration, Rash, Scarring, Dry/Scaly, Maceration, Atrophie Blanche, Cyanosis, Ecchymosis, Hemosiderin Staining, Mottled, Pallor, Rubor, Erythema. Periwound temperature was noted as No Abnormality. Assessment Active Problems ICD-10 Non-pressure chronic ulcer of left calf with necrosis of muscle Lymphedema, not elsewhere classified Chronic venous hypertension (idiopathic) with ulcer and inflammation of left lower extremity Type 2 diabetes mellitus with other skin ulcer Non-pressure chronic ulcer of right calf limited to breakdown of skin Plan Wound Cleansing: Wound #17 Right,Lateral Lower Leg: Clean wound with Normal Saline. Cleanse wound with mild soap and water Wound #2 Left,Lateral,Posterior Lower Leg: Clean wound with Normal Saline. Cleanse wound with mild soap and water Anesthetic (add to Medication List): Wound #17 Right,Lateral Lower Leg: Topical Lidocaine 4% cream applied to wound bed prior to debridement (In Clinic Only). Wound #2 Left,Lateral,Posterior Lower Leg: Topical Lidocaine 4% cream applied to wound bed prior to debridement (In Clinic Only). Skin Barriers/Peri-Wound Care: Wound #2 Left,Lateral,Posterior Lower Leg: Barrier cream Primary Wound Dressing: Wound #17 Right,Lateral Lower Leg: Silver Alginate - on all draining areas Wound #2 Left,Lateral,Posterior Lower Leg: Silver Alginate - on all draining areas Secondary Dressing: Wound #17 Right,Lateral Lower Leg: Boardered Foam Dressing Wound #2 Left,Lateral,Posterior Lower Leg: Boardered Foam Dressing Dressing Change Frequency: Wound #2 Left,Lateral,Posterior Lower Leg: Change Dressing Monday, Wednesday, Friday - Wednesdays in [facility] Follow-up Appointments: Wound #17 Right,Lateral Lower Leg: Return Appointment in 1 week. Wound #2 Left,Lateral,Posterior Lower Leg: Return Appointment in 1 week. Edema Control: Bracewell, Glenden A. ([id number]) Wound #17 Right,Lateral Lower  Leg: Patient to wear own Velcro compression garment. Elevate legs to the level of the heart and pump ankles as often as possible Compression Pump: Use compression pump on left lower extremity for 30 minutes, twice daily. - 1 Hour three times Daily Compression Pump: Use compression pump on right lower extremity for 30 minutes, twice daily. - 1 Hour three times daily Wound #2 Left,Lateral,Posterior Lower Leg: Patient to wear own Velcro compression garment. Elevate legs to the level of the heart and pump ankles as often as possible Compression Pump: Use compression pump on left lower extremity for 30 minutes, twice daily. - 1 Hour three times Daily Compression Pump: Use compression pump on right lower extremity for 30 minutes, twice daily. - 1 Hour three times daily Home Health: Wound #17 Right,Lateral Lower Leg: Continue Home Health Visits - [facility]: Monday, Wednesday and Friday. Home Health Nurse may visit PRN to address patient s wound care needs. FACE TO FACE ENCOUNTER: MEDICARE and MEDICAID PATIENTS: I certify that this patient is under my care and that I had a face-to-face encounter that meets the physician face-to-face encounter requirements with this patient on this date. The encounter with the patient was in whole or in part for the following MEDICAL CONDITION: (primary reason for [place]) MEDICAL NECESSITY: I certify, that based on my findings, NURSING services are a medically necessary home health service. HOME BOUND STATUS: I certify that my clinical findings  support that this patient is homebound (i.e., Due to illness or injury, pt requires aid of supportive devices such as crutches, cane, wheelchairs, walkers, the use of special transportation or the assistance of another person to leave their place of residence. There is a normal inability to leave the home and doing so requires considerable and taxing effort. Other absences are for medical reasons / religious services  and are infrequent or of short duration when for other reasons). If current dressing causes regression in wound condition, may D/C ordered dressing product/s and apply Normal Saline Moist Dressing daily until next [place] / Other MD appointment. [place] of regression in wound condition at [phone]. Please direct any NON-WOUND related issues/requests for orders to patient's Primary Care Physician Wound #2 Left,Lateral,Posterior Lower Leg: [place] Visits - [facility]: Monday, Wednesday and Friday. Home Health Nurse may visit PRN to address patient s wound care needs. FACE TO FACE ENCOUNTER: MEDICARE and MEDICAID PATIENTS: I certify that this patient is under my care and that I had a face-to-face encounter that meets the physician face-to-face encounter requirements with this patient on this date. The encounter with the patient was in whole or in part for the following MEDICAL CONDITION: (primary reason for [place]) MEDICAL NECESSITY: I certify, that based on my findings, NURSING services are a medically necessary home health service. HOME BOUND STATUS: I certify that my clinical findings support that this patient is homebound (i.e., Due to illness or injury, pt requires aid of supportive devices such as crutches, cane, wheelchairs, walkers, the use of special transportation or the assistance of another person to leave their place of residence. There is a normal inability to leave the home and doing so requires considerable and taxing effort. Other absences are for medical reasons / religious services and are infrequent or of short duration when for other reasons). If current dressing causes regression in wound condition, may D/C ordered dressing product/s and apply Normal Saline Moist Dressing daily until next [place] / Other MD appointment. [place] of regression in wound condition at [phone]. Please  direct any NON-WOUND related issues/requests for orders to patient's Primary Care Physician #1 the patient's edema is about the same left greater than right. There is no pitting but I'm whole folded the increase in diuretics will result in less edema in the lower extremities. He is described orthopnea and I have always felt he was somewhat systemically fluid volume overloaded. #2 silver alginate to the 2 open areas left lateral calf/ankle and the right lateral calf. I'm going to allow him to use his own stockings and external compression stockings and see how we do here. Again a lot of this is going to depend on the success of the metolazone/torsemide combination. Electronic Signature(s) Signed: 08/09/2018 4:51:45 PM By: Linton Ham MD [name] ([id number]) Entered By: Linton Ham on 08/09/2018 11:24:11 [name], [name] ([id number]) -------------------------------------------------------------------------------- SuperBill Details Patient Name: [name], [name] Date of Service: 08/09/2018 Medical Record Number: [id number] Patient Account Number: [id number] Date of Birth/Sex: 19-Sep-1970 (47 y.o. M) Treating RN: Cornell Barman Primary Care Provider: Gaetano Net Other Clinician: Referring Provider: Gaetano Net Treating Provider/Extender: Tito Dine in Treatment: 32 Diagnosis Coding ICD-10 Codes Code Description [phone] Non-pressure chronic ulcer of left calf with necrosis of muscle I89.0 Lymphedema, not elsewhere classified I87.332 Chronic venous hypertension (idiopathic) with ulcer and inflammation of left lower extremity E11.622 Type 2 diabetes mellitus with other skin ulcer L97.211 Non-pressure  chronic ulcer of right calf limited to breakdown of skin Facility Procedures CPT4 Code: 89842103 Description: 12811 - WOUND CARE VISIT-LEV 3 EST PT Modifier: Quantity: 1 Physician Procedures CPT4 Code: 8867737 Description: 36681 - WC PHYS LEVEL 3 - EST PT ICD-10  Diagnosis Description L97.223 Non-pressure chronic ulcer of left calf with necrosis of mu L97.211 Non-pressure chronic ulcer of right calf limited to breakdo I89.0 Lymphedema, not elsewhere classified Modifier: scle wn of skin Quantity: 1 Electronic Signature(s) Signed: 08/09/2018 4:51:45 PM By: Linton Ham MD Entered By: Linton Ham on 08/09/2018 11:25:37

## 2018-08-16 ENCOUNTER — Encounter: Payer: BLUE CROSS/BLUE SHIELD | Admitting: Internal Medicine

## 2018-08-16 DIAGNOSIS — E11622 Type 2 diabetes mellitus with other skin ulcer: Secondary | ICD-10-CM | POA: Diagnosis not present

## 2018-08-17 NOTE — Progress Notes (Signed)
[name] ([id number]) Visit Report for 08/16/2018 HPI Details Patient Name: [name], [name] Date of Service: 08/16/2018 8:15 AM Medical Record Number: [id number] Patient Account Number: [id number] Date of Birth/Sex: 02/14/1971 (47 y.o. M) Treating RN: [name] Primary Care Provider: Gaetano [name] Other Clinician: Referring Provider: Gaetano [name] Treating Provider/Extender: [name] in Treatment: 61 History of Present Illness HPI Description: 12/28/17; this is a now 47 year old man who is a type II diabetic. He was hospitalized from 10/01/17 through 10/19/17. He had an MSSA soft tissue and skin infection. 2 open areas on the left leg were identified he has a smaller area on the left medial calf superiorly just below the knee and a wound just above the left ankle on the posterior medial aspect. I think both of these were surgical IandD sites when he was in the hospital. He was discharged with a wound VAC at that point however this is since been taken off. He follows with Dr. Ola [name] for the [facility] and he is still on chronic Keflex at 500 twice a day. At that time he was hospitalized his hemoglobin A1c was 15.1 however if I'm reading his endocrinologist notes correctly that is improved. He has been following with Dr. Ronalee [name] at vein and vascular and he has been applying calcium alginate and Unna boots. He has home health changing the dressing. They have also been attempting to get him external compression pumps although the patient is unaware whether they've been approved by insurance at this point. as mentioned he has a smaller clean wound on the right lateral calf just below the knee and he has a much larger area just above the left ankle medially and posteriorly. Our intake nurse reported greenish purulent looking drainage.the patient did have surgical material sent to pathology in February. This showed chronic abscess The patient also has lymphedema stage III in the left  greater than right lower extremities. He has a history of blisters with wounds but these of all were always healed. The patient thinks that the lymphedema may have been present since he was about 47 years old i.[name] about 30 years. He does not have graded pressure stockings and has not worn stockings. He does not have a distant history of DVT PE or phlebitis. He has not been systemically unwell fever no chills. He states that his Lasix is recently been reduced. He tells me his kidney function is at "30%" and he has been followed by Dr. Candiss [name] of nephrology. At one point he was on Lasix 80 twice a day however that's been cut back and he is now on Lasix at 20 twice a day. The patient has a history of PAD listed in his records although he comes from Dr. Ronalee [name] I don't think is felt to have significant PAD. ABIs in our clinic were noncompressible bilaterally. 01/04/18; patient has a large wound on the left lateral lower calf and a small wound on the left medial upper calf. He has been to see his nephrologist who changed him to Demadex 40 mg a day. I'm hopeful this will help with his systemic fluid overload. He also has stage III lymphedema. Really no change in the 2 wounds since last week 01/11/18; the patient is down 13 pounds. He put his stage III lymphedema left leg in 4K compression last week and there is less edema fluid however we still haven't been able to communicate with home health but apparently it is [facility] but the dressings have not been changed. The  patient noted an odor last week. He is also had compression pumps ordered by [facility] this as a not completed the paperwork stage. 01/18/18; patient continues to lose weight. Stage III lymphedema in the left greater than right leg under for alert compression. The major wound is on the left lateral ankle area. He apparently has bilateral compression pumps being brought to his house, these were ordered by  vein and  vascular Notable for the fact today he had some blisters on the right anterior leg together with some skin nodules. This is no doubt secondary to severe lymphedema. 01/25/18; the patient has obtained his compression pumps and is using them per vein and vascular instructions 3 times a day for an hour. He also saw [name] of vein and vascular. He was felt to have venous insufficiency but did not suggest any intervention also improved edema. It was suggested that he have compression stockings 20-30 mm on a daily basis in addition to compression pumps. [name], [name] ([id number]) The patient arrives in clinic today with a layer of unna under for layer compression. he seems to have some trouble with the degree of compression. He has open areas on the left lateral ankle area which is his major wound left upper medial calf and a superficial open area on the right anterior shin area which was blistered last week. He has skin changes on the right anterior calf which I think are no doubt secondary to lymphedema skin nodules etc. 02/01/18; the patient comes in telling us his nephrologist have to his torsemide. Unfortunately today he is put on 7 pounds by our scales. He has blisters all over the anterior and medial part of his right calf and a new open wound. He also has soupy green drainage coming out of the left lateral calf /ankle wound. 02/08/18; culture I did last week of the left lateral ankle wound grew both Pseudomonas and Morganella. He is on Keflex from Dr. Ola [name] in the hospital. I will need to review these notes.in any case Keflex is not going to cover these 2 organisms. I'm probably going to added ciprofloxacin today for 1 week. A lot of drainage that looks purulent last week. He is not complaining of pain however he has managed to put on 10 pounds in 2 weeks by our scales in this clinic. He is going to see his nephrologist tomorrow 02/15/18; he completed the ciprofloxacin I gave him last week.  Notable that he is up to 379 pounds today which is up 16 pounds from 2 weeks ago. He is complaining of orthopnea but doesn't have any chest pain. 02/22/18; he continues to have weight gain. R intake nurse reports again purulent green drainage coming out of the lateral wound on the lateral left calf. He has small open area on the right anterior leg. His torsemide was increased to 2 tablets a day I believe this is 40 mg last week in response to the call admitted to Dr. Keturah Barre office. He follows up with Dr. Candiss [name] and Dr. Delana Meyer tomorrow 03/01/18 his weight essentially stable today at 383 pounds. Drainage out of the left lateral wound on the lower left calf/ankle is a lot less. Culture last time grew Pseudomonas. I put him on cefdinir. He has been to Dr. Keturah Barre office no adjustments in his diuretics. Dr. Nicoletta Dress prescribed a wraparound stocking for the right leg.there is no open area on the right leg. He has a superficial area on the left medial calf, left posterior  calf and in the large area on the left lateral however this looks better 03/15/18; weight is not up to 393 pounds. He saw his nephrologist yesterday Dr. Candiss [name] will increase the Demadex I'm hopeful this will help with the edema control. I'm using silver alginate to all his wounds. In particular the left lateral ankle looks better. oUnfortunately he has new open areas on the right lateral calf that will include use of his compression stocking at least in the short to medium term. He has new wounds o3 on the right lateral calf. One of these has some size however all numerous superficial 03/22/18; his weight is stabilized a bit. Just adjustment of his diuretics by his nephrologist. There is no doubt he has some degree of systemic fluid overload on top of severe left greater than right lymphedema. 2 weeks ago tried to transition him to stockings on the right leg however he developed re-breakdown of skin on the right leg and we had to put him back  in compression last week. He also uses external compression pumps and claims to be compliant Continued concern about his depression today 03/29/18; several ongoing issues with this patient; oHe no longer has home health coverage apparently secondary to a lapse in insurance. He is apparently transitioning from short for long-term disability. [facility] was changing his compression wraps on Monday and Friday. oHe has gained 10 pounds since last week oSees Dr. Ronalee [name] tomorrow oSaw his primary doctor last week about the depression. It sounds as though he declined pharmacologic management. He seems somewhat better today. We were concerned last week when he came. Somewhat better today oHe is using his compression pumps once a day at home, I have asked for twice a day if possible especially on the left leg oFollows up with his nephrologist in mid-August. He is managing his diuretic for I think stage IV chronic renal failure oParadoxically his wounds actually look better 04/19/18; the patient has not been seen since I last saw him 3 weeks ago. He saw Dr. Delana Meyer of vascular surgery on 03/30/18 I believe he put him in a 20/30 stocking bilaterally with a wraparound extremitease stocking. He has not been putting anything specifically on the wound. More problematic than that he has not been wearing the stockings he is at home. He has been using his external compression pumps once per day according to him on a rare occasion twice On a psychosocial level the patient is now on long-term disability and is applying for COBRA therefore he is between insurances. He has not been able to follow up with Dr. Candiss [name] who is his nephrologist as a result. As noted his weight is up to 407 pounds today. He promises me he'll follow-up with Dr. Candiss [name] 05/03/18; he hasn't been here in 2 weeks now. He apparently has been wearing a compression sock on the left leg. Massive increase in edema 3 large open wounds on the left anterior  leg that were probably blisters. Significant deterioration in the left lateral ankle wound that we've been doing as his most problematic wound. He still does not have his insurance issues wrapped up. His weight is well over 400 pounds. 05/10/18; arrives today with better looking edema control in the left leg. He has been using his compression pumps twice a day. We also wrapped it is left leg for there he's been using his pumps so there is much better edema control. Most of new [place], Hillman A. ([id number]) wounds from last week look a  lot better. Even the refractory area on the lower left lateral ankle looks a lot better to me today. He follows up with Dr. Candiss [name] this morning [nephrology] 05/17/18 patient arrives today with a lot less edema in the left leg. His weight is gone down 7 pounds. He tells me he saw Dr. Candiss [name] but his torsemide was not adjusted. He is using the palms 3 times a day. There is been quite an improvement in the remaining wound on the left lateral ankle oWeekly visit for follow-up of bilateral lower extremity wounds related to severe lymphedema and probably some degree of systemic fluid overload from chronic renal failure stage IV. His weight is up this week to over 400 pounds. He noted increasing edema in the right leg late last week. He took his compression stocking off he did not increase the frequency of this compression pump use. He developed a large blister on the back of the right calf. He also has a new opened blister on the left lateral calf in addition to the wound that we've been using on the distal left lower calf area and we've been using silver alginate. He tells me that he has an ultrasound which is a DVT rule out and I think a reflux study ordered by Dr. Ronalee [name] 05/31/18; weekly visit. He went to see vein and vascular this week apparently they remove the 4-layer compression on the left and put an Unna boot on him in replacement. He's got more swelling in the left leg  is resolved. That being said his left lower Wound is better. He still has a fairly large wound on the right posterior calf this was not disturbed. He has a follow-up appointment with Dr. Keturah Barre nephrologist next week 06/07/2018; the patient arrives today with a history that he took both his compression wraps off 3 days ago in order to take a shower. He has bilateral severe tense blisters. A lot of weeping erythema especially on the lateral left calf. Almost circumferential blisters on the right. Multiple areas of epithelial breakdown. He does not complain of any pain fever chills. He states he has been using his compression pump in some form of stocking that I could not really determine the type. He did not come into the clinic with anything on his legs. He tells me he has an appointment with Dr. Merita Norton his nephrologist I believe this Friday. Paradoxically his weight is actually down to 385 pounds I believe last week he was over 400 06/14/18; arrives with better looking wound surfaces today on both legs. There is no further blistering however there are still areas that aren't epithelialized on the right anterior, right lateral, left posterior and left medial. His original wound just above the left ankle laterally is still open moist. His weight was about the same today. He tells me that his nephrologist increase the torsemide from 40/20/09/1939/40 twice a day 06/21/18; both his wraps fell down to his mid calf. Has a result he has multiple blisters across the anterior right leg above with the wraps ultimately watched. He also has blisters on the posterior part of the left calf o2. His original wound on the left lateral calf is hard to see any open area. There is however a divot. Which is going to be difficult to deal with into the future until the edema in the left leg is controlled. 2 small superficial areas remain He tells [place] his weight was 386 pounds on his scale at home. According to our scale he  is up 5 pounds. He is not on a fluid restriction 06/28/18; patient's weight is gone up 2 pounds since last time. He has weeping edema and open superficial wounds on the right posterior right lateral and right anterior calf. He has a small open area on the left anterior probably left posterior calf and the original wound on the left lateral calf appears to be just about closed He is being planned for a dialysis shunt in the right arm through [facility] vascular incoordination with his nephrologist Dr. Candiss [name] He claims to be using his compression pumps twice a day. He has lymphedema and no doubt systemic fluid volume overload from stage IV chronic renal failure 07/04/18; patient's weight is up into the 396 range. He is supposed to see his cardiologist tomorrow and plans are being made for a shunt by Dr. Delana Meyer. He still has significant open wounds/draining areas on the right anterior and right posterior calf. Several small open areas on the left which are less in terms of wound area on the right which is surprising given the fact the left is the larger most lymphedematous leg 07/26/2018 She is seen today for follow-up and management of lateral posterior lower leg wound and bilateral lymphoedema. He has a very flat affect and depressed mood today. Unable to elicit much information from him today. No thoughts of harm to self identified. Recently had a follow-up with vascular vein for fistula placement to initiate dialysis in the right arm. He has 2 dressings on the right arm from the fistula procedure with no bleeding or drainage noted on the dressing. He does have a large amount of bruising in the surrounding to puncture areas on the right lower arm from the fistula placement. No pain elicited with palpation of the right arm. He denies a diminished sensation in that right lower arm as well. He does have a small wound to the left lateral posterior lower leg. No visual drainage present to either leg.  However the wound to the left lower leg does have a foul odor even after cleaning it. The left is the larger most lymphedematous leg with the right Leg still having a significant amount of edema. No drainage or blisters present on either leg today. He states that he is using the lymphedema pumps twice a day. Not too sure if he is compliant with actually using the lymphedema pumps based on the amount of edema that he has in his legs. Weight increased from 396.1 to 401.6. No recent fevers, chills, or shortness of breath. 08/02/18; the patient only has one remaining wound on the left lateral calf which is still open. This is in the resultant [facility] [place]. ([id number]) shaped area which was once the site of the major wound. He does not have any open areas on the right leg nor additional wounds on the left no blistering. As usual the left leg is much larger than the right. He comes in today stating that he took the wraps off on the right leg on Friday and since then he has presumably been wearing his stocking which is a wraparound variant stocking on the right. States he took the 4-layer compression off his left leg this morning to shower. He is seeing vein and vascular tomorrow. He states he is using his compression pumps once or twice a day. He also admits that he has been on his feet a lot 08/09/18; the patient has the left leg healed except for the original wound site on  the left medial Just above the ankle. On the right he has a new wound on the right lateral calf. He saw vein and vascular last week and he is been back in his pressure stockings and wraparound stockings. He also is been put on metolazone 3 times a week by nephrology along with his torsemide. I'm hopeful that this will help with some of the lower extremity edema. I've always felt that he has lymphedema but there may be a secondary component contributing 08/16/18; the patient used his own junk slight stockings to both legs last  week. He claims he is pumping once to twice a day at home. Here rise with his legs looking visibly less edematous. His usual left greater than right secondary to lymphedema. He is tolerating the metolazone 3 times a week along with the torsemide. His weight was down 2 pounds. He follows up with nephrology soon for follow-up lab work I think in early January. He does not have any open areas on the right leg he still has the crevice on the lateral left calf just above the ankle there is 2 small open areas here I am not sure that this is ever going to be free of an open area although certainly not worsening. More problematic lead he has 2 blistered areas just above the wrist on the left lateral calf Electronic Signature(s) Signed: 08/16/2018 5:55:34 PM By: Linton Ham MD Entered By: Linton Ham on 08/16/2018 08:56:42 [name], [name] ([id number]) -------------------------------------------------------------------------------- Physical Exam Details Patient Name: [name], [name] Date of Service: 08/16/2018 8:15 AM Medical Record Number: [id number] Patient Account Number: [id number] Date of Birth/Sex: 03/14/1971 (47 y.o. M) Treating RN: [name] Primary Care Provider: Gaetano [name] Other Clinician: Referring Provider: Gaetano [name] Treating Provider/Extender: [name] in Treatment: 56 Constitutional Patient is hypertensive.. Pulse regular and within target range for patient.[name] Respirations regular, non-labored and within target range.. Temperature is normal and within the target range for the patient.[name] appears in no distress. Eyes Conjunctivae clear. No discharge. Respiratory Respiratory effort is easy and symmetric bilaterally. Rate is normal at rest and on room air.. Bilateral breath sounds are clear and equal in all lobes with no wheezes, rales or rhonchi.. Cardiovascular Heart rhythm and rate regular, without murmur or gallop.JVP is not elevated. Much better edema  control bilaterally. His shunt in the right antecubital fossa has a nice bruit. Integumentary (Hair, Skin) His skin is dry in his legs and he complains of pruritus but only his legs not generally. Psychiatric Seems stable somewhat depressed. Notes Wound exam; oThe only open areas on the crevice in his left lateral leg 2 small open areas. I'm not sure that this is ever going to completely fill in her remaining filled in oShe has 2 blisters above this area Electronic Signature(s) Signed: 08/16/2018 5:55:34 PM By: Linton Ham MD Entered By: Linton Ham on 08/16/2018 08:59:22 [name], [name] Kitchen ([id number]) -------------------------------------------------------------------------------- Physician Orders Details Patient Name: [name], [name] Date of Service: 08/16/2018 8:15 AM Medical Record Number: [id number] Patient Account Number: [id number] Date of Birth/Sex: 1971/03/02 (47 y.o. M) Treating RN: [name] Primary Care Provider: Gaetano [name] Other Clinician: Referring Provider: Gaetano [name] Treating Provider/Extender: [name] in Treatment: 34 Verbal / Phone Orders: No Diagnosis Coding Wound Cleansing Wound #2 Left,Lateral,Posterior Lower Leg o Clean wound with Normal Saline. o Cleanse wound with mild soap and water Anesthetic (add to Medication List) Wound #2 Left,Lateral,Posterior Lower Leg o Topical Lidocaine 4% cream applied to wound bed prior  to debridement (In Clinic Only). Skin Barriers/Peri-Wound Care Wound #2 Left,Lateral,Posterior Lower Leg o Barrier cream Primary Wound Dressing Wound #2 Left,Lateral,Posterior Lower Leg o Silver Alginate - on all draining areas Secondary Dressing Wound #2 Left,Lateral,Posterior Lower Leg o Boardered Foam Dressing Dressing Change Frequency Wound #2 Left,Lateral,Posterior Lower Leg o Change Dressing Monday, Wednesday, Friday - Wednesdays in [place] [facility] Follow-up Appointments Wound #2  Left,Lateral,Posterior Lower Leg o Return Appointment in 1 week. Edema Control Wound #2 Left,Lateral,Posterior Lower Leg o Patient to wear own Velcro compression garment. o Elevate legs to the level of the heart and pump ankles as often as possible o Compression Pump: Use compression pump on left lower extremity for 30 minutes, twice daily. - 1 Hour three times Daily o Compression Pump: Use compression pump on right lower extremity for 30 minutes, twice daily. - 1 Hour three times daily Home Health Wound #2 Left,Lateral,Posterior Lower Leg o Continue Home Health Visits - [facility]: Monday, Wednesday (when no appointment with wound care) and Friday. EINAR, NOLASCO ([id number]) o Home Health Nurse may visit PRN to address patientos wound care needs. o FACE TO FACE ENCOUNTER: MEDICARE and MEDICAID PATIENTS: I certify that this patient is under my care and that I had a face-to-face encounter that meets the physician face-to-face encounter requirements with this patient on this date. The encounter with the patient was in whole or in part for the following MEDICAL CONDITION: (primary reason for [place]) MEDICAL NECESSITY: I certify, that based on my findings, NURSING services are a medically necessary home health service. HOME BOUND STATUS: I certify that my clinical findings support that this patient is homebound (i.[name], Due to illness or injury, pt requires aid of supportive devices such as crutches, cane, wheelchairs, walkers, the use of special transportation or the assistance of another person to leave their place of residence. There is a normal inability to leave the home and doing so requires considerable and taxing effort. Other absences are for medical reasons / religious services and are infrequent or of short duration when for other reasons). o If current dressing causes regression in wound condition, may D/C ordered dressing product/s and apply Normal Saline  Moist Dressing daily until next [place] / Other MD appointment. [place] of regression in wound condition at [phone]. o Please direct any NON-WOUND related issues/requests for orders to patient's Primary Care Physician Electronic Signature(s) Signed: 08/16/2018 5:32:05 PM By: Gretta Cool, BSN, RN, CWS, Kim RN, BSN Signed: 08/16/2018 5:55:34 PM By: Linton Ham MD Entered By: Gretta Cool, BSN, RN, CWS, Kim on 08/16/2018 08:49:10 Becker, [name] Kitchen ([id number]) -------------------------------------------------------------------------------- Problem List Details Patient Name: Gugliotta, Dail A. Date of Service: 08/16/2018 8:15 AM Medical Record Number: [id number] Patient Account Number: [id number] Date of Birth/Sex: 17-Apr-1971 (47 y.o. 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 ([id number]) -------------------------------------------------------------------------------- Progress Note Details Patient Name: [name], [name] Date of Service: 08/16/2018 8:15 AM Medical Record Number: [id number] Patient Account Number: [id number] Date of  Birth/Sex: Jun 29, 1971 (47 y.o. M) Treating RN: [name] Primary Care Provider: Gaetano [name] Other Clinician: Referring Provider: Gaetano [name] Treating Provider/Extender: [name] in Treatment: 33 Subjective History of Present Illness (HPI) 12/28/17; this is a now 47 year old man who is a type II diabetic. He was hospitalized from 10/01/17 through 10/19/17. He had an MSSA soft tissue and skin infection. 2 open areas on the left leg were identified he has a smaller area on the left medial calf superiorly just below the knee and a wound just above the left ankle on the posterior medial aspect. I think both of these were surgical IandD sites when he was in the hospital. He was discharged with a wound VAC at that point however this is since been taken off. He follows with Dr. Ola [name] for the [facility] and he is still on chronic Keflex at 500 twice a day. At that time he was hospitalized his hemoglobin A1c was 15.1 however if I'm reading his endocrinologist notes correctly that is improved. He has been following with Dr. Ronalee [name] at vein and vascular and he has been applying calcium alginate and Unna boots. He has home health changing the dressing. They have also been attempting to get him external compression pumps although the patient is unaware whether they've been approved by insurance at this point. as mentioned he has a smaller clean wound on the right lateral calf just below the knee and he has a much larger area just above the left ankle medially and posteriorly. Our intake nurse reported greenish purulent looking drainage.the patient did have surgical material sent to pathology in February. This showed chronic abscess The patient also has lymphedema stage III in the left greater than right lower extremities. He has a history of blisters with wounds but these of all were always healed. The patient thinks that the lymphedema may have been present since he was about 47 years old i.[name]  about 30 years. He does not have graded pressure stockings and has not worn stockings. He does not have a distant history of DVT PE or phlebitis. He has not been systemically unwell fever no chills. He states that his Lasix is recently been reduced. He tells me his kidney function is at "30%" and he has been followed by Dr. Candiss [name] of nephrology. At one point he was on Lasix 80 twice a day however that's been cut back and he is now on Lasix at 20 twice a day. The patient has a history of PAD listed in his records although he comes from Dr. Ronalee [name] I don't think is felt to have significant PAD. ABIs in our clinic were noncompressible bilaterally. 01/04/18; patient has a large wound on the left lateral lower calf and a small wound on the left medial upper calf. He has been to see his nephrologist who changed him to Demadex 40 mg a day. I'm hopeful this will help with his systemic fluid overload. He also has stage III lymphedema. Really no change in the 2 wounds since last week 01/11/18; the patient is down 13 pounds. He put his stage III lymphedema left leg in 4K compression last week and there is less edema fluid however we still haven't been able to communicate with home health but apparently it is [facility] but the dressings have not been changed. The patient noted an  odor last week. He is also had compression pumps ordered by [facility] this as a not completed the paperwork stage. 01/18/18; patient continues to lose weight. Stage III lymphedema in the left greater than right leg under for alert compression. The major wound is on the left lateral ankle area. He apparently has bilateral compression pumps being brought to his house, these were ordered by [facility] Notable for the fact today he had some blisters on the right anterior leg together with some skin nodules. This is no doubt secondary to severe lymphedema. 01/25/18; the patient has obtained his compression pumps  and is using them per vein and vascular instructions 3 times a day for an hour. He also saw [name] of vein and vascular. He was felt to have venous insufficiency but did not suggest any intervention also improved edema. It was suggested that he have compression stockings 20-30 mm on a daily basis in addition to compression pumps. The patient arrives in clinic today with a layer of unna under for layer compression. he seems to have some trouble with the degree of compression. He has open areas on the left lateral ankle area which is his major wound left upper medial calf and a [name], [name] ([id number]) superficial open area on the right anterior shin area which was blistered last week. He has skin changes on the right anterior calf which I think are no doubt secondary to lymphedema skin nodules etc. 02/01/18; the patient comes in telling us his nephrologist have to his torsemide. Unfortunately today he is put on 7 pounds by our scales. He has blisters all over the anterior and medial part of his right calf and a new open wound. He also has soupy green drainage coming out of the left lateral calf /ankle wound. 02/08/18; culture I did last week of the left lateral ankle wound grew both Pseudomonas and Morganella. He is on Keflex from Dr. Ola [name] in the hospital. I will need to review these notes.in any case Keflex is not going to cover these 2 organisms. I'm probably going to added ciprofloxacin today for 1 week. A lot of drainage that looks purulent last week. He is not complaining of pain however he has managed to put on 10 pounds in 2 weeks by our scales in this clinic. He is going to see his nephrologist tomorrow 02/15/18; he completed the ciprofloxacin I gave him last week. Notable that he is up to 379 pounds today which is up 16 pounds from 2 weeks ago. He is complaining of orthopnea but doesn't have any chest pain. 02/22/18; he continues to have weight gain. R intake nurse reports again  purulent green drainage coming out of the lateral wound on the lateral left calf. He has small open area on the right anterior leg. His torsemide was increased to 2 tablets a day I believe this is 40 mg last week in response to the call admitted to Dr. Keturah Barre office. He follows up with Dr. Candiss [name] and Dr. Delana Meyer tomorrow 03/01/18 his weight essentially stable today at 383 pounds. Drainage out of the left lateral wound on the lower left calf/ankle is a lot less. Culture last time grew Pseudomonas. I put him on cefdinir. He has been to Dr. Keturah Barre office no adjustments in his diuretics. Dr. Nicoletta Dress prescribed a wraparound stocking for the right leg.there is no open area on the right leg. He has a superficial area on the left medial calf, left posterior calf and in  the large area on the left lateral however this looks better 03/15/18; weight is not up to 393 pounds. He saw his nephrologist yesterday Dr. Candiss [name] will increase the Demadex I'm hopeful this will help with the edema control. I'm using silver alginate to all his wounds. In particular the left lateral ankle looks better. Unfortunately he has new open areas on the right lateral calf that will include use of his compression stocking at least in the short to medium term. He has new wounds o3 on the right lateral calf. One of these has some size however all numerous superficial 03/22/18; his weight is stabilized a bit. Just adjustment of his diuretics by his nephrologist. There is no doubt he has some degree of systemic fluid overload on top of severe left greater than right lymphedema. 2 weeks ago tried to transition him to stockings on the right leg however he developed re-breakdown of skin on the right leg and we had to put him back in compression last week. He also uses external compression pumps and claims to be compliant Continued concern about his depression today 03/29/18; several ongoing issues with this patient; He no longer has home health  coverage apparently secondary to a lapse in insurance. He is apparently transitioning from short for long-term disability. [facility] was changing his compression wraps on Monday and Friday. He has gained 10 pounds since last week Sees Dr. Ronalee [name] tomorrow Saw his primary doctor last week about the depression. It sounds as though he declined pharmacologic management. He seems somewhat better today. We were concerned last week when he came. Somewhat better today He is using his compression pumps once a day at home, I have asked for twice a day if possible especially on the left leg Follows up with his nephrologist in mid-August. He is managing his diuretic for I think stage IV chronic renal failure Paradoxically his wounds actually look better 04/19/18; the patient has not been seen since I last saw him 3 weeks ago. He saw Dr. Delana Meyer of vascular surgery on 03/30/18 I believe he put him in a 20/30 stocking bilaterally with a wraparound extremitease stocking. He has not been putting anything specifically on the wound. More problematic than that he has not been wearing the stockings he is at home. He has been using his external compression pumps once per day according to him on a rare occasion twice On a psychosocial level the patient is now on long-term disability and is applying for COBRA therefore he is between insurances. He has not been able to follow up with Dr. Candiss [name] who is his nephrologist as a result. As noted his weight is up to 407 pounds today. He promises me he'll follow-up with Dr. Candiss [name] 05/03/18; he hasn't been here in 2 weeks now. He apparently has been wearing a compression sock on the left leg. Massive increase in edema 3 large open wounds on the left anterior leg that were probably blisters. Significant deterioration in the left lateral ankle wound that we've been doing as his most problematic wound. He still does not have his insurance issues wrapped up. His weight is well over  400 pounds. 05/10/18; arrives today with better looking edema control in the left leg. He has been using his compression pumps twice a day. We also wrapped it is left leg for there he's been using his pumps so there is much better edema control. Most of new wounds from last week look a lot better. Even the refractory area on  the lower left lateral ankle looks a lot better to me today. He follows up with Dr. Candiss [name] this morning [nephrology] 05/17/18 patient arrives today with a lot less edema in the left leg. His weight is gone down 7 pounds. He tells me he saw Dr. Helene Kelp, Quadre A. ([id number]) [name] but his torsemide was not adjusted. He is using the palms 3 times a day. There is been quite an improvement in the remaining wound on the left lateral ankle Weekly visit for follow-up of bilateral lower extremity wounds related to severe lymphedema and probably some degree of systemic fluid overload from chronic renal failure stage IV. His weight is up this week to over 400 pounds. He noted increasing edema in the right leg late last week. He took his compression stocking off he did not increase the frequency of this compression pump use. He developed a large blister on the back of the right calf. He also has a new opened blister on the left lateral calf in addition to the wound that we've been using on the distal left lower calf area and we've been using silver alginate. He tells me that he has an ultrasound which is a DVT rule out and I think a reflux study ordered by Dr. Ronalee [name] 05/31/18; weekly visit. He went to see vein and vascular this week apparently they remove the 4-layer compression on the left and put an Unna boot on him in replacement. He's got more swelling in the left leg is resolved. That being said his left lower Wound is better. He still has a fairly large wound on the right posterior calf this was not disturbed. He has a follow-up appointment with Dr. Keturah Barre nephrologist next  week 06/07/2018; the patient arrives today with a history that he took both his compression wraps off 3 days ago in order to take a shower. He has bilateral severe tense blisters. A lot of weeping erythema especially on the lateral left calf. Almost circumferential blisters on the right. Multiple areas of epithelial breakdown. He does not complain of any pain fever chills. He states he has been using his compression pump in some form of stocking that I could not really determine the type. He did not come into the clinic with anything on his legs. He tells me he has an appointment with Dr. Merita Norton his nephrologist I believe this Friday. Paradoxically his weight is actually down to 385 pounds I believe last week he was over 400 06/14/18; arrives with better looking wound surfaces today on both legs. There is no further blistering however there are still areas that aren't epithelialized on the right anterior, right lateral, left posterior and left medial. His original wound just above the left ankle laterally is still open moist. His weight was about the same today. He tells me that his nephrologist increase the torsemide from 40/20/09/1939/40 twice a day 06/21/18; both his wraps fell down to his mid calf. Has a result he has multiple blisters across the anterior right leg above with the wraps ultimately watched. He also has blisters on the posterior part of the left calf o2. His original wound on the left lateral calf is hard to see any open area. There is however a divot. Which is going to be difficult to deal with into the future until the edema in the left leg is controlled. 2 small superficial areas remain He tells [place] his weight was 386 pounds on his scale at home. According to our scale he is up 5  pounds. He is not on a fluid restriction 06/28/18; patient's weight is gone up 2 pounds since last time. He has weeping edema and open superficial wounds on the right posterior right lateral and right  anterior calf. He has a small open area on the left anterior probably left posterior calf and the original wound on the left lateral calf appears to be just about closed He is being planned for a dialysis shunt in the right arm through [facility] incoordination with his nephrologist Dr. Candiss [name] He claims to be using his compression pumps twice a day. He has lymphedema and no doubt systemic fluid volume overload from stage IV chronic renal failure 07/04/18; patient's weight is up into the 396 range. He is supposed to see his cardiologist tomorrow and plans are being made for a shunt by Dr. Delana Meyer. He still has significant open wounds/draining areas on the right anterior and right posterior calf. Several small open areas on the left which are less in terms of wound area on the right which is surprising given the fact the left is the larger most lymphedematous leg 07/26/2018 She is seen today for follow-up and management of lateral posterior lower leg wound and bilateral lymphoedema. He has a very flat affect and depressed mood today. Unable to elicit much information from him today. No thoughts of harm to self identified. Recently had a follow-up with vascular vein for fistula placement to initiate dialysis in the right arm. He has 2 dressings on the right arm from the fistula procedure with no bleeding or drainage noted on the dressing. He does have a large amount of bruising in the surrounding to puncture areas on the right lower arm from the fistula placement. No pain elicited with palpation of the right arm. He denies a diminished sensation in that right lower arm as well. He does have a small wound to the left lateral posterior lower leg. No visual drainage present to either leg. However the wound to the left lower leg does have a foul odor even after cleaning it. The left is the larger most lymphedematous leg with the right Leg still having a significant amount of edema. No drainage or  blisters present on either leg today. He states that he is using the lymphedema pumps twice a day. Not too sure if he is compliant with actually using the lymphedema pumps based on the amount of edema that he has in his legs. Weight increased from 396.1 to 401.6. No recent fevers, chills, or shortness of breath. 08/02/18; the patient only has one remaining wound on the left lateral calf which is still open. This is in the resultant [place] shaped area which was once the site of the major wound. He does not have any open areas on the right leg nor additional wounds on the left no blistering. As usual the left leg is much larger than the right. Pinon, Shawnta A. ([id number]) He comes in today stating that he took the wraps off on the right leg on Friday and since then he has presumably been wearing his stocking which is a wraparound variant stocking on the right. States he took the 4-layer compression off his left leg this morning to shower. He is seeing vein and vascular tomorrow. He states he is using his compression pumps once or twice a day. He also admits that he has been on his feet a lot 08/09/18; the patient has the left leg healed except for the original wound site on the left  medial Just above the ankle. On the right he has a new wound on the right lateral calf. He saw vein and vascular last week and he is been back in his pressure stockings and wraparound stockings. He also is been put on metolazone 3 times a week by nephrology along with his torsemide. I'm hopeful that this will help with some of the lower extremity edema. I've always felt that he has lymphedema but there may be a secondary component contributing 08/16/18; the patient used his own junk slight stockings to both legs last week. He claims he is pumping once to twice a day at home. Here rise with his legs looking visibly less edematous. His usual left greater than right secondary to lymphedema. He is tolerating the metolazone 3  times a week along with the torsemide. His weight was down 2 pounds. He follows up with nephrology soon for follow-up lab work I think in early January. He does not have any open areas on the right leg he still has the crevice on the lateral left calf just above the ankle there is 2 small open areas here I am not sure that this is ever going to be free of an open area although certainly not worsening. More problematic lead he has 2 blistered areas just above the wrist on the left lateral calf Objective Constitutional Patient is hypertensive.. Pulse regular and within target range for patient.[name] Respirations regular, non-labored and within target range.. Temperature is normal and within the target range for the patient.[name] appears in no distress. Vitals Time Taken: 8:25 AM, Height: 76 in, Weight: 401.6 lbs, BMI: 48.9, Temperature: 98.1 F, Pulse: 60 bpm, Respiratory Rate: 18 breaths/min, Blood Pressure: 174/73 mmHg. Eyes Conjunctivae clear. No discharge. Respiratory Respiratory effort is easy and symmetric bilaterally. Rate is normal at rest and on room air.. Bilateral breath sounds are clear and equal in all lobes with no wheezes, rales or rhonchi.. Cardiovascular Heart rhythm and rate regular, without murmur or gallop.JVP is not elevated. Much better edema control bilaterally. His shunt in the right antecubital fossa has a nice bruit. Psychiatric Seems stable somewhat depressed. General Notes: Wound exam; The only open areas on the crevice in his left lateral leg 2 small open areas. I'm not sure that this is ever going to completely fill in her remaining filled in She has 2 blisters above this area Integumentary (Hair, Skin) His skin is dry in his legs and he complains of pruritus but only his legs not generally. Wound #17 status is Healed - Epithelialized. Original cause of wound was Gradually Appeared. The wound is located on the Right,Lateral Lower Leg. The wound measures 0cm length x 0cm  width x 0cm depth; 0cm^2 area and 0cm^3 volume. There is Fat Layer (Subcutaneous Tissue) Exposed exposed. There is no tunneling or undermining noted. There is a medium [name], [name] ([id number]) amount of serosanguineous drainage noted. The wound margin is flat and intact. There is small (1-33%) pink granulation within the wound bed. There is a medium (34-66%) amount of necrotic tissue within the wound bed including Adherent Slough. The periwound skin appearance exhibited: Dry/Scaly, Hemosiderin Staining. The periwound skin appearance did not exhibit: Callus, Crepitus, Excoriation, Induration, Rash, Scarring, Maceration, Atrophie Blanche, Cyanosis, Ecchymosis, Mottled, Pallor, Rubor, Erythema. Periwound temperature was noted as No Abnormality. Wound #2 status is Open. Original cause of wound was Gradually Appeared. The wound is located on the Left,Lateral,Posterior Lower Leg. The wound measures 1cm length x 4cm width x 0.2cm depth; 3.142cm^2 area  and 0.628cm^3 volume. There is Fat Layer (Subcutaneous Tissue) Exposed exposed. There is a medium amount of serosanguineous drainage noted. The wound margin is indistinct and nonvisible. There is no granulation within the wound bed. There is no necrotic tissue within the wound bed. The periwound skin appearance did not exhibit: Callus, Crepitus, Excoriation, Induration, Rash, Scarring, Dry/Scaly, Maceration, Atrophie Blanche, Cyanosis, Ecchymosis, Hemosiderin Staining, Mottled, Pallor, Rubor, Erythema. Periwound temperature was noted as No Abnormality. Assessment Active Problems ICD-10 Non-pressure chronic ulcer of left calf with necrosis of muscle Lymphedema, not elsewhere classified Chronic venous hypertension (idiopathic) with ulcer and inflammation of left lower extremity Type 2 diabetes mellitus with other skin ulcer Non-pressure chronic ulcer of right calf limited to breakdown of skin Plan Wound Cleansing: Wound #2 Left,Lateral,Posterior  Lower Leg: Clean wound with Normal Saline. Cleanse wound with mild soap and water Anesthetic (add to Medication List): Wound #2 Left,Lateral,Posterior Lower Leg: Topical Lidocaine 4% cream applied to wound bed prior to debridement (In Clinic Only). Skin Barriers/Peri-Wound Care: Wound #2 Left,Lateral,Posterior Lower Leg: Barrier cream Primary Wound Dressing: Wound #2 Left,Lateral,Posterior Lower Leg: Silver Alginate - on all draining areas Secondary Dressing: Wound #2 Left,Lateral,Posterior Lower Leg: Boardered Foam Dressing Dressing Change Frequency: Wound #2 Left,Lateral,Posterior Lower Leg: Change Dressing Monday, Wednesday, Friday - Wednesdays in [facility] Follow-up Appointments: Wound #2 Left,Lateral,Posterior Lower Leg: Return Appointment in 1 week. Edema Control: [name], [name] ([id number]) Wound #2 Left,Lateral,Posterior Lower Leg: Patient to wear own Velcro compression garment. Elevate legs to the level of the heart and pump ankles as often as possible Compression Pump: Use compression pump on left lower extremity for 30 minutes, twice daily. - 1 Hour three times Daily Compression Pump: Use compression pump on right lower extremity for 30 minutes, twice daily. - 1 Hour three times daily Home Health: Wound #2 Left,Lateral,Posterior Lower Leg: Continue Home Health Visits - [facility]: Monday, Wednesday (when no appointment with wound care) and Friday. Home Health Nurse may visit PRN to address patient s wound care needs. FACE TO FACE ENCOUNTER: MEDICARE and MEDICAID PATIENTS: I certify that this patient is under my care and that I had a face-to-face encounter that meets the physician face-to-face encounter requirements with this patient on this date. The encounter with the patient was in whole or in part for the following MEDICAL CONDITION: (primary reason for [place]) MEDICAL NECESSITY: I certify, that based on my findings, NURSING services are a medically  necessary home health service. HOME BOUND STATUS: I certify that my clinical findings support that this patient is homebound (i.[name], Due to illness or injury, pt requires aid of supportive devices such as crutches, cane, wheelchairs, walkers, the use of special transportation or the assistance of another person to leave their place of residence. There is a normal inability to leave the home and doing so requires considerable and taxing effort. Other absences are for medical reasons / religious services and are infrequent or of short duration when for other reasons). If current dressing causes regression in wound condition, may D/C ordered dressing product/s and apply Normal Saline Moist Dressing daily until next [place] / Other MD appointment. [place] of regression in wound condition at [phone]. Please direct any NON-WOUND related issues/requests for orders to patient's Primary Care Physician #1 were using silver alginate/Border foam 2 he is wearing his stockings #3 is not taking the stockings off and encouraged him to do so at night so he can lubricate the skin on his legs which will  help with the pruritus #4 no evidence of heart failure much better edema control #5 asked him to use his compression pumps twice a day Electronic Signature(s) Signed: 08/16/2018 5:55:34 PM By: Linton Ham MD Entered By: Linton Ham on 08/16/2018 09:00:37 [place], Shizuo A. ([id number]) -------------------------------------------------------------------------------- SuperBill Details Patient Name: [name], [name] Date of Service: 08/16/2018 Medical Record Number: [id number] Patient Account Number: [id number] Date of Birth/Sex: 04-05-1971 (48 y.o. M) Treating RN: [name] Primary Care Provider: Gaetano [name] Other Clinician: Referring Provider: Gaetano [name] Treating Provider/Extender: [name] in Treatment: 33 Diagnosis Coding ICD-10 Codes Code  Description [phone] Non-pressure chronic ulcer of left calf with necrosis of muscle I89.0 Lymphedema, not elsewhere classified I87.332 Chronic venous hypertension (idiopathic) with ulcer and inflammation of left lower extremity E11.622 Type 2 diabetes mellitus with other skin ulcer L97.211 Non-pressure chronic ulcer of right calf limited to breakdown of skin Facility Procedures CPT4 Code: [id number] Description: [phone] - WOUND CARE VISIT-LEV 2 EST PT Modifier: Quantity: 1 Physician Procedures CPT4 Code: 4210312 Description: 81188 - WC PHYS LEVEL 3 - EST PT ICD-10 Diagnosis Description L97.223 Non-pressure chronic ulcer of left calf with necrosis of m I89.0 Lymphedema, not elsewhere classified Modifier: uscle Quantity: 1 Electronic Signature(s) Signed: 08/16/2018 5:55:34 PM By: Linton Ham MD Entered By: Linton Ham on 08/16/2018 09:01:03

## 2018-08-23 NOTE — Progress Notes (Signed)
TRENTIN, KNAPPENBERGER ([id number]) Visit Report for 08/16/2018 Arrival Information Details Patient Name: Serda, Ibn A. Date of Service: 08/16/2018 8:15 AM Medical Record Number: [id number] Patient Account Number: [id number] Date of Birth/Sex: 1970/12/23 (47 y.o. M) Treating RN: Harold Barban Primary Care Piccola Arico: Gaetano Net Other Clinician: Referring Joy Reiger: Gaetano Net Treating Trayden Brandy/Extender: Tito Dine in Treatment: 56 Visit Information History Since Last Visit Added or deleted any medications: No Patient Arrived: Ambulatory Any new allergies or adverse reactions: No Arrival Time: 08:23 Had a fall or experienced change in No Accompanied By: self activities of daily living that may affect Transfer Assistance: None risk of falls: Patient Identification Verified: Yes Signs or symptoms of abuse/neglect since last visito No Secondary Verification Process Yes Hospitalized since last visit: No Completed: Has Dressing in Place as Prescribed: Yes Patient Requires Transmission-Based No Has Compression in Place as Prescribed: Yes Precautions: Pain Present Now: No Patient Has Alerts: Yes Patient Alerts: DM II noncompressible bilateral Electronic Signature(s) Signed: 08/22/2018 1:37:43 PM By: Harold Barban Entered By: Harold Barban on 08/16/2018 08:25:43 Kepple, Aleric A. ([id number]) -------------------------------------------------------------------------------- Clinic Level of Care Assessment Details Patient Name: Aeschliman, Kshawn A. Date of Service: 08/16/2018 8:15 AM Medical Record Number: [id number] Patient Account Number: [id number] Date of Birth/Sex: 06/16/1971 (47 y.o. M) Treating RN: Cornell Barman Primary Care Jazen Spraggins: Gaetano Net Other Clinician: Referring Arel Tippen: Gaetano Net Treating Wanisha Shiroma/Extender: Tito Dine in Treatment: 60 Clinic Level of Care Assessment Items TOOL 4 Quantity Score []  - Use when only an EandM is performed on  FOLLOW-UP visit 0 ASSESSMENTS - Nursing Assessment / Reassessment []  - Reassessment of Co-morbidities (includes updates in patient status) 0 X- 1 5 Reassessment of Adherence to Treatment Plan ASSESSMENTS - Wound and Skin Assessment / Reassessment X - Simple Wound Assessment / Reassessment - one wound 1 5 []  - 0 Complex Wound Assessment / Reassessment - multiple wounds []  - 0 Dermatologic / Skin Assessment (not related to wound area) ASSESSMENTS - Focused Assessment []  - Circumferential Edema Measurements - multi extremities 0 []  - 0 Nutritional Assessment / Counseling / Intervention []  - 0 Lower Extremity Assessment (monofilament, tuning fork, pulses) []  - 0 Peripheral Arterial Disease Assessment (using hand held doppler) ASSESSMENTS - Ostomy and/or Continence Assessment and Care []  - Incontinence Assessment and Management 0 []  - 0 Ostomy Care Assessment and Management (repouching, etc.) PROCESS - Coordination of Care X - Simple Patient / Family Education for ongoing care 1 15 []  - 0 Complex (extensive) Patient / Family Education for ongoing care []  - 0 Staff obtains Programmer, systems, Records, Test Results / Process Orders []  - 0 Staff telephones HHA, Nursing Homes / Clarify orders / etc []  - 0 Routine Transfer to another Facility (non-emergent condition) []  - 0 Routine Hospital Admission (non-emergent condition) []  - 0 New Admissions / Biomedical engineer / Ordering NPWT, Apligraf, etc. []  - 0 Emergency Hospital Admission (emergent condition) X- 1 10 Simple Discharge Coordination Ambrose, Devontay A. ([id number]) []  - 0 Complex (extensive) Discharge Coordination PROCESS - Special Needs []  - Pediatric / Minor Patient Management 0 []  - 0 Isolation Patient Management []  - 0 Hearing / Language / Visual special needs []  - 0 Assessment of Community assistance (transportation, D/C planning, etc.) []  - 0 Additional assistance / Altered mentation []  - 0 Support Surface(s)  Assessment (bed, cushion, seat, etc.) INTERVENTIONS - Wound Cleansing / Measurement []  - Simple Wound Cleansing - one wound 0 []  - 0 Complex Wound Cleansing - multiple wounds X- 1 5 Wound  Imaging (photographs - any number of wounds) []  - 0 Wound Tracing (instead of photographs) X- 1 5 Simple Wound Measurement - one wound []  - 0 Complex Wound Measurement - multiple wounds INTERVENTIONS - Wound Dressings []  - Small Wound Dressing one or multiple wounds 0 X- 1 15 Medium Wound Dressing one or multiple wounds []  - 0 Large Wound Dressing one or multiple wounds []  - 0 Application of Medications - topical []  - 0 Application of Medications - injection INTERVENTIONS - Miscellaneous []  - External ear exam 0 []  - 0 Specimen Collection (cultures, biopsies, blood, body fluids, etc.) []  - 0 Specimen(s) / Culture(s) sent or taken to Lab for analysis []  - 0 Patient Transfer (multiple staff / Civil Service fast streamer / Similar devices) []  - 0 Simple Staple / Suture removal (25 or less) []  - 0 Complex Staple / Suture removal (26 or more) []  - 0 Hypo / Hyperglycemic Management (close monitor of Blood Glucose) []  - 0 Ankle / Brachial Index (ABI) - do not check if billed separately X- 1 5 Vital Signs Lineman, Aaryav A. ([id number]) Has the patient been seen at the hospital within the last three years: Yes Total Score: 65 Level Of Care: New/Established - Level 2 Electronic Signature(s) Signed: 08/16/2018 5:32:05 PM By: Gretta Cool, BSN, RN, CWS, Kim RN, BSN Entered By: Gretta Cool, BSN, RN, CWS, Kim on 08/16/2018 08:49:55 Coppinger, Korvin AMarland Kitchen ([id number]) -------------------------------------------------------------------------------- Encounter Discharge Information Details Patient Name: Toren, Morley A. Date of Service: 08/16/2018 8:15 AM Medical Record Number: [id number] Patient Account Number: [id number] Date of Birth/Sex: [birth year] (47 y.o. M) Treating RN: Secundino Ginger Primary Care Roald Lukacs: Gaetano Net Other  Clinician: Referring Zanaya Baize: Gaetano Net Treating Miranda Frese/Extender: Tito Dine in Treatment: 25 Encounter Discharge Information Items Discharge Condition: Stable Ambulatory Status: Ambulatory Discharge Destination: Home Transportation: Private Auto Accompanied By: self Schedule Follow-up Appointment: Yes Clinical Summary of Care: Electronic Signature(s) Signed: 08/16/2018 10:49:56 AM By: Secundino Ginger Entered By: Secundino Ginger on 08/16/2018 09:06:08 Dwyer, Djibril A. ([id number]) -------------------------------------------------------------------------------- Lower Extremity Assessment Details Patient Name: Bejar, Laureano A. Date of Service: 08/16/2018 8:15 AM Medical Record Number: [id number] Patient Account Number: [id number] Date of Birth/Sex: December 12, 1970 (47 y.o. M) Treating RN: Harold Barban Primary Care Eragon Hammond: Gaetano Net Other Clinician: Referring Sharis Keeran: Gaetano Net Treating Khyler Urda/Extender: Tito Dine in Treatment: 33 Edema Assessment Assessed: [Left: No] [Right: No] [Left: Edema] [Right: :] Calf Left: Right: Point of Measurement: 36 cm From Medial Instep 60 cm 55.4 cm Ankle Left: Right: Point of Measurement: 14 cm From Medial Instep 48.7 cm 39.2 cm Vascular Assessment Pulses: Dorsalis Pedis Palpable: [Left:No] [Right:No] Posterior Tibial Palpable: [Left:No] [Right:No] Extremity colors, hair growth, and conditions: Extremity Color: [Left:Hyperpigmented] [Right:Hyperpigmented] Hair Growth on Extremity: [Left:Yes] [Right:Yes] Temperature of Extremity: [Left:Warm] [Right:Warm] Capillary Refill: [Left:< 3 seconds] [Right:< 3 seconds] Toe Nail Assessment Left: Right: Thick: Yes Yes Discolored: Yes Yes Deformed: Yes Yes Improper Length and Hygiene: Yes Yes Electronic Signature(s) Signed: 08/22/2018 1:37:43 PM By: Harold Barban Entered By: Harold Barban on 08/16/2018 08:37:51 Spicer, Brekken A.  ([id number]) -------------------------------------------------------------------------------- Multi Wound Chart Details Patient Name: Fiorini, Willian A. Date of Service: 08/16/2018 8:15 AM Medical Record Number: [id number] Patient Account Number: [id number] Date of Birth/Sex: 1971-03-24 (46 y.o. 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] [2:Diabetic Wound/Ulcer of the Lower Extremity] [N/A:N/A] Secondary Etiology: [17:N/A] [2:Lymphedema] [N/A:N/A] Comorbid History: [17:Hypertension, Type II Diabetes, Neuropathy] [2:Hypertension, Type II Diabetes, Neuropathy] [N/A:N/A] Date Acquired: [17:08/07/2018] [2:09/30/2017] [N/A:N/A] Weeks of Treatment: [17:1] [2:33] [N/A:N/A] Wound Status: [17:Healed - Epithelialized] [2:Open] [N/A:N/A] Measurements L x W x D [17:0x0x0] [2:1x4x0.2] [N/A:N/A] (cm) Area (cm) : [17:0] [2:3.142] [N/A:N/A] Volume (cm) : [17:0] [2:0.628] [N/A:N/A] % Reduction in Area: [17:100.00%] [2:88.90%] [N/A:N/A] % Reduction in Volume: [17:100.00%] [2:94.40%] [N/A:N/A] Classification: [17:Grade 2] [2:Grade 2] [N/A:N/A] Exudate Amount: [17:Medium] [2:Medium] [N/A:N/A] Exudate Type: [17:Serosanguineous] [2:Serosanguineous] [N/A:N/A] Exudate Color: [17:red, brown] [2:red, brown] [N/A:N/A] Wound Margin: [17:Flat and Intact] [2:Indistinct, nonvisible] [N/A:N/A] Granulation Amount: [17:Small  (1-33%)] [2:None Present (0%)] [N/A:N/A] Granulation Quality: [17:Pink] [2:N/A] [N/A:N/A] Necrotic Amount: [17:Medium (34-66%)] [2:None Present (0%)] [N/A:N/A] Exposed Structures: [17:Fat Layer (Subcutaneous Tissue) Exposed: Yes Fascia: No Tendon: No Muscle: No Joint: No Bone: No] [2:Fat Layer (Subcutaneous Tissue) Exposed: Yes Fascia: No Tendon: No Muscle: No Joint: No Bone: No] [N/A:N/A] Epithelialization: [17:Small (1-33%)] [2:None] [N/A:N/A] Periwound Skin Texture: [17:Excoriation: No Induration: No Callus: No] [2:Excoriation: No Induration: No Callus: No] [N/A:N/A] Crepitus: No Crepitus: No Rash: No Rash: No Scarring: No Scarring: No Periwound Skin Moisture: Dry/Scaly: Yes Maceration: No N/A Maceration: No Dry/Scaly: No Periwound Skin Color: Hemosiderin Staining: Yes Atrophie Blanche: No N/A Atrophie Blanche: No Cyanosis: No Cyanosis: No Ecchymosis: No Ecchymosis: No Erythema: No Erythema: No Hemosiderin Staining: No Mottled: No Mottled: No Pallor: No Pallor: No Rubor: No Rubor: No Temperature: No Abnormality No Abnormality N/A Tenderness on Palpation: No No N/A Wound Preparation: Ulcer Cleansing: Ulcer Cleansing: N/A Rinsed/Irrigated with Saline Rinsed/Irrigated with Saline Topical Anesthetic Applied: Topical Anesthetic Applied: Other: lidocaine 4% Other: lidocaine 4% Treatment Notes Electronic Signature(s) Signed: 08/16/2018 5:55:34 PM By: Linton Ham MD Entered By: Linton Ham on 08/16/2018 08:54:14 Giannini, Nymir AMarland Kitchen ([id number]) -------------------------------------------------------------------------------- Multi-Disciplinary Care Plan Details Patient Name: Izard, Yehoshua A. Date of Service: 08/16/2018 8:15 AM Medical Record Number: [id number] Patient Account Number: [id number] Date of Birth/Sex: 01/11/1971 (47 y.o. M) Treating RN: Cornell Barman Primary Care Hans Rusher: Gaetano Net Other Clinician: Referring Keyatta Tolles: Gaetano Net Treating  Colandra Ohanian/Extender: Tito Dine in Treatment: 27 Active Inactive Abuse / Safety / Falls / Self Care Management Nursing Diagnoses: Potential for falls Goals: Patient will remain injury free related to falls Date Initiated: 12/28/2017 Target Resolution Date: 04/08/2018 Goal Status: Active Interventions: Assess fall risk on admission and as needed Notes: Nutrition Nursing Diagnoses: Impaired glucose control: actual or potential Goals: Patient/caregiver verbalizes understanding of need to maintain therapeutic glucose control per primary care physician Date Initiated: 12/28/2017 Target Resolution Date: 04/08/2018 Goal Status: Active Interventions: Provide education on elevated blood sugars and impact on wound healing Notes: Orientation to the Wound Care Program Nursing Diagnoses: Knowledge deficit related to the wound healing center program Goals: Patient/caregiver will verbalize understanding of the [facility] Date Initiated: 12/28/2017 Target Resolution Date: 04/08/2018 Goal Status: Active Interventions: Provide education on orientation to the wound center Cleckler, Tyrie A. ([id number]) Notes: Venous Leg Ulcer Nursing Diagnoses: Knowledge deficit related to disease process and management Goals: Patient will maintain optimal edema control Date Initiated: 03/27/2018 Target Resolution Date: 04/27/2018 Goal Status: Active Interventions: Assess peripheral edema status every visit. Compression as ordered Treatment Activities: Therapeutic compression applied : 03/22/2018 Notes: Wound/Skin Impairment Nursing Diagnoses: Impaired tissue integrity Goals: Ulcer/skin  breakdown will heal within 14 weeks Date Initiated: 12/28/2017 Target Resolution Date: 04/08/2018 Goal Status: Active Interventions: Assess patient/caregiver ability to obtain necessary supplies Assess patient/caregiver ability to perform ulcer/skin care regimen upon admission and as needed Assess  ulceration(s) every visit Notes: Electronic Signature(s) Signed: 08/16/2018 5:32:05 PM By: Gretta Cool, BSN, RN, CWS, Kim RN, BSN Entered By: Gretta Cool, BSN, RN, CWS, Kim on 08/16/2018 08:45:21 Hartlage, Quinnten A. ([id number]) -------------------------------------------------------------------------------- Non-Wound Condition Assessment Details Patient Name: Noguera, Viraat A. Date of Service: 08/16/2018 8:15 AM Medical Record Number: [id number] Patient Account Number: [id number] Date of Birth/Sex: 1971-01-14 (47 y.o. M) Treating RN: Cornell Barman Primary Care Raphel Stickles: Gaetano Net Other Clinician: Referring Brayten Komar: Gaetano Net Treating Teeghan Hammer/Extender: Tito Dine in Treatment: 33 Non-Wound Condition: Condition: Lymphedema Location: Leg Side: Bilateral Photos Photo Uploaded By: Harold Barban on 08/16/2018 09:14:52 Periwound Skin Texture Texture Color No Abnormalities Noted: No No Abnormalities Noted: No Callus: No Atrophie Blanche: No Crepitus: No Cyanosis: No Excoriation: Yes Ecchymosis: No Friable: No Erythema: No Induration: Yes Hemosiderin Staining: Yes Rash: No Mottled: No Scarring: No Pallor: No Rubor: Yes Moisture No Abnormalities Noted: No Dry / Scaly: No Maceration: No Notes Two Blisters on left leg. Electronic Signature(s) Signed: 08/16/2018 5:32:05 PM By: Gretta Cool, BSN, RN, CWS, Kim RN, BSN Entered By: Gretta Cool, BSN, RN, CWS, Kim on 08/16/2018 08:46:22 Heiden, [name] ([id number]) -------------------------------------------------------------------------------- Pain Assessment Details Patient Name: Whitby, Adalbert A. Date of Service: 08/16/2018 8:15 AM Medical Record Number: [id number] Patient Account Number: [id number] Date of Birth/Sex: [birth year] (47 y.o. M) Treating RN: Harold Barban Primary Care Tiger Spieker: Gaetano Net Other Clinician: Referring Aydden Cumpian: Gaetano Net Treating Iver Fehrenbach/Extender: Tito Dine in Treatment: 78 Active  Problems Location of Pain Severity and Description of Pain Patient Has Paino No Site Locations Pain Management and Medication Current Pain Management: Electronic Signature(s) Signed: 08/22/2018 1:37:43 PM By: Harold Barban Entered By: Harold Barban on 08/16/2018 08:25:50 Tasker, Thao A. ([id number]) -------------------------------------------------------------------------------- Patient/Caregiver Education Details Patient Name: Huelsmann, Pattrick A. Date of Service: 08/16/2018 8:15 AM Medical Record Number: [id number] Patient Account Number: [id number] Date of Birth/Gender: Mar 28, 1971 (47 y.o. M) Treating RN: Cornell Barman Primary Care Physician: Gaetano Net Other Clinician: Referring Physician: Gaetano Net Treating Physician/Extender: Tito Dine in Treatment: 42 Education Assessment Education Provided To: Patient Education Topics Provided Venous: Handouts: Controlling Swelling with Compression Stockings Methods: Demonstration, Explain/Verbal Responses: State content correctly Wound/Skin Impairment: Handouts: Caring for Your Ulcer Methods: Demonstration, Explain/Verbal Responses: State content correctly Electronic Signature(s) Signed: 08/16/2018 10:49:56 AM By: Secundino Ginger Entered By: Secundino Ginger on 08/16/2018 09:06:15 Lampron, Egon A. ([id number]) -------------------------------------------------------------------------------- Wound Assessment Details Patient Name: Ebrahim, Teddie A. Date of Service: 08/16/2018 8:15 AM Medical Record Number: [id number] Patient Account Number: [id number] Date of Birth/Sex: 07/13/1971 (47 y.o. M) Treating RN: Cornell Barman Primary Care Jaimi Belle: Gaetano Net Other Clinician: Referring Akiko Schexnider: Gaetano Net Treating Dazhane Villagomez/Extender: Tito Dine in Treatment: 33 Wound Status Wound Number: 17 Primary Etiology: Diabetic Wound/Ulcer of the Lower Extremity Wound Location: Right, Lateral Lower Leg Wound Status: Healed -  Epithelialized Wounding Event: Gradually Appeared Comorbid Hypertension, Type II Diabetes, Date Acquired: 08/07/2018 History: Neuropathy Weeks Of Treatment: 1 Clustered Wound: No Photos Photo Uploaded By: Harold Barban on 08/16/2018 09:09:56 Wound Measurements Length: (cm) 0 % R Width: (cm) 0 % R Depth: (cm) 0 Epi Area: (cm) 0 Tu Volume: (cm) 0 Un eduction in Area: 100% eduction in Volume: 100% thelialization: Small (1-33%) nneling: No dermining: No Wound Description Classification: Grade 2 Wound Margin: Flat and Intact  Exudate Amount: Medium Exudate Type: Serosanguineous Exudate Color: red, brown Foul Odor After Cleansing: No Slough/Fibrino Yes Wound Bed Granulation Amount: Small (1-33%) Exposed Structure Granulation Quality: Pink Fascia Exposed: No Necrotic Amount: Medium (34-66%) Fat Layer (Subcutaneous Tissue) Exposed: Yes Necrotic Quality: Adherent Slough Tendon Exposed: No Muscle Exposed: No Joint Exposed: No Bone Exposed: No Periwound Skin Texture Mauzy, Jacquise A. ([id number]) Texture Color No Abnormalities Noted: No No Abnormalities Noted: No Callus: No Atrophie Blanche: No Crepitus: No Cyanosis: No Excoriation: No Ecchymosis: No Induration: No Erythema: No Rash: No Hemosiderin Staining: Yes Scarring: No Mottled: No Pallor: No Moisture Rubor: No No Abnormalities Noted: No Dry / Scaly: Yes Temperature / Pain Maceration: No Temperature: No Abnormality Wound Preparation Ulcer Cleansing: Rinsed/Irrigated with Saline Topical Anesthetic Applied: Other: lidocaine 4%, Electronic Signature(s) Signed: 08/16/2018 5:32:05 PM By: Gretta Cool, BSN, RN, CWS, Kim RN, BSN Entered By: Gretta Cool, BSN, RN, CWS, Kim on 08/16/2018 08:47:51 Albarran, [name] ([id number]) -------------------------------------------------------------------------------- Wound Assessment Details Patient Name: Jarema, Swade A. Date of Service: 08/16/2018 8:15 AM Medical Record Number:  [id number] Patient Account Number: [id number] Date of Birth/Sex: Apr 27, 1971 (47 y.o. M) Treating RN: Harold Barban Primary Care Pachia Strum: Gaetano Net Other Clinician: Referring Braun Rocca: Gaetano Net Treating Vijay Durflinger/Extender: Tito Dine in Treatment: 33 Wound Status Wound Number: 2 Primary Etiology: Diabetic Wound/Ulcer of the Lower Extremity Wound Location: Left Lower Leg - Lateral, Posterior Secondary Lymphedema Wounding Event: Gradually Appeared Etiology: Date Acquired: 09/30/2017 Wound Status: Open Weeks Of Treatment: 33 Comorbid History: Hypertension, Type II Diabetes, Clustered Wound: No Neuropathy Photos Wound Measurements Length: (cm) 1 Width: (cm) 4 Depth: (cm) 0.2 Area: (cm) 3.142 Volume: (cm) 0.628 % Reduction in Area: 88.9% % Reduction in Volume: 94.4% Epithelialization: None Wound Description Classification: Grade 2 Wound Margin: Indistinct, nonvisible Exudate Amount: Medium Exudate Type: Serosanguineous Exudate Color: red, brown Foul Odor After Cleansing: No Slough/Fibrino Yes Wound Bed Granulation Amount: None Present (0%) Exposed Structure Necrotic Amount: None Present (0%) Fascia Exposed: No Fat Layer (Subcutaneous Tissue) Exposed: Yes Tendon Exposed: No Muscle Exposed: No Joint Exposed: No Bone Exposed: No Periwound Skin Texture Texture Color Juste, Wolfe A. ([id number]) No Abnormalities Noted: No No Abnormalities Noted: No Callus: No Atrophie Blanche: No Crepitus: No Cyanosis: No Excoriation: No Ecchymosis: No Induration: No Erythema: No Rash: No Hemosiderin Staining: No Scarring: No Mottled: No Pallor: No Moisture Rubor: No No Abnormalities Noted: No Dry / Scaly: No Temperature / Pain Maceration: No Temperature: No Abnormality Wound Preparation Ulcer Cleansing: Rinsed/Irrigated with Saline Topical Anesthetic Applied: Other: lidocaine 4%, Treatment Notes Wound #2 (Left, Lateral, Posterior Lower Leg) 1.  Cleansed with: Clean wound with Normal Saline 2. Anesthetic Other (specify in notes) 4. Dressing Applied: Other dressing (specify in notes) 5. Secondary Dressing Applied ABD Pad 6. Footwear/Offloading device applied Other footwear/offloading device applied (specify in notes) Notes Silvercell, bordered foam dressing and patient's own compression wraps Electronic Signature(s) Signed: 08/22/2018 1:37:43 PM By: Harold Barban Entered By: Harold Barban on 08/16/2018 09:12:46 Mcelroy, Trafton A. ([id number]) -------------------------------------------------------------------------------- Vitals Details Patient Name: Fakhouri, Chauncey A. Date of Service: 08/16/2018 8:15 AM Medical Record Number: [id number] Patient Account Number: [id number] Date of Birth/Sex: 04-16-1971 (47 y.o. M) Treating RN: Harold Barban Primary Care Smera Guyette: Gaetano Net Other Clinician: Referring Jovani Colquhoun: Gaetano Net Treating Dayan Desa/Extender: Tito Dine in Treatment: 58 Vital Signs Time Taken: 08:25 Temperature (F): 98.1 Height (in): 76 Pulse (bpm): 60 Weight (lbs): 401.6 Respiratory Rate (breaths/min): 18 Body Mass Index (BMI): 48.9 Blood Pressure (mmHg): 174/73 Reference Range: 80 - 120  mg / dl Electronic Signature(s) Signed: 08/22/2018 1:37:43 PM By: Harold Barban Entered By: Harold Barban on 08/16/2018 08:26:34

## 2018-09-06 ENCOUNTER — Encounter: Payer: BLUE CROSS/BLUE SHIELD | Attending: Internal Medicine | Admitting: Internal Medicine

## 2018-09-06 DIAGNOSIS — I890 Lymphedema, not elsewhere classified: Secondary | ICD-10-CM | POA: Insufficient documentation

## 2018-09-06 DIAGNOSIS — E1122 Type 2 diabetes mellitus with diabetic chronic kidney disease: Secondary | ICD-10-CM | POA: Diagnosis not present

## 2018-09-06 DIAGNOSIS — E11622 Type 2 diabetes mellitus with other skin ulcer: Secondary | ICD-10-CM | POA: Diagnosis not present

## 2018-09-06 DIAGNOSIS — I129 Hypertensive chronic kidney disease with stage 1 through stage 4 chronic kidney disease, or unspecified chronic kidney disease: Secondary | ICD-10-CM | POA: Diagnosis not present

## 2018-09-06 DIAGNOSIS — N184 Chronic kidney disease, stage 4 (severe): Secondary | ICD-10-CM | POA: Diagnosis not present

## 2018-09-06 DIAGNOSIS — L97223 Non-pressure chronic ulcer of left calf with necrosis of muscle: Secondary | ICD-10-CM | POA: Insufficient documentation

## 2018-09-06 DIAGNOSIS — F329 Major depressive disorder, single episode, unspecified: Secondary | ICD-10-CM | POA: Insufficient documentation

## 2018-09-06 DIAGNOSIS — E1140 Type 2 diabetes mellitus with diabetic neuropathy, unspecified: Secondary | ICD-10-CM | POA: Insufficient documentation

## 2018-09-06 DIAGNOSIS — Z992 Dependence on renal dialysis: Secondary | ICD-10-CM | POA: Insufficient documentation

## 2018-09-06 DIAGNOSIS — L97211 Non-pressure chronic ulcer of right calf limited to breakdown of skin: Secondary | ICD-10-CM | POA: Insufficient documentation

## 2018-09-07 NOTE — Progress Notes (Signed)
MILLION, MAHARAJ ([id number]) Visit Report for 09/06/2018 HPI Details Patient Name: [name], [name] Date of Service: 09/06/2018 1:45 PM Medical Record Number: [id number] Patient Account Number: [id number] Date of Birth/Sex: 04-Jun-1971 (48 y.o. M) Treating RN: [name] Primary Care Provider: Gaetano [name] Other Clinician: Referring Provider: Gaetano [name] Treating Provider/Extender: [name] in Treatment: 109 History of Present Illness HPI Description: 12/28/17; this is a now 48 year old man who is a type II diabetic. He was hospitalized from 10/01/17 through 10/19/17. He had an MSSA soft tissue and skin infection. 2 open areas on the left leg were identified he has a smaller area on the left medial calf superiorly just below the knee and a wound just above the left ankle on the posterior medial aspect. I think both of these were surgical IandD sites when he was in the hospital. He was discharged with a wound VAC at that point however this is since been taken off. He follows with Dr. Ola [name] for the [facility] and he is still on chronic Keflex at 500 twice a day. At that time he was hospitalized his hemoglobin A1c was 15.1 however if I'm reading his endocrinologist notes correctly that is improved. He has been following with Dr. Ronalee [name] at vein and vascular and he has been applying calcium alginate and Unna boots. He has home health changing the dressing. They have also been attempting to get him external compression pumps although the patient is unaware whether they've been approved by insurance at this point. as mentioned he has a smaller clean wound on the right lateral calf just below the knee and he has a much larger area just above the left ankle medially and posteriorly. Our intake nurse reported greenish purulent looking drainage.the patient did have surgical material sent to pathology in February. This showed chronic abscess The patient also has lymphedema stage III in the left  greater than right lower extremities. He has a history of blisters with wounds but these of all were always healed. The patient thinks that the lymphedema may have been present since he was about 48 years old i.e. about 30 years. He does not have graded pressure stockings and has not worn stockings. He does not have a distant history of DVT PE or phlebitis. He has not been systemically unwell fever no chills. He states that his Lasix is recently been reduced. He tells me his kidney function is at "30%" and he has been followed by [name] of nephrology. At one point he was on Lasix 80 twice a day however that's been cut back and he is now on Lasix at 20 twice a day. The patient has a history of PAD listed in his records although he comes from Dr. Ronalee [name] I don't think is felt to have significant PAD. ABIs in our clinic were noncompressible bilaterally. 01/04/18; patient has a large wound on the left lateral lower calf and a small wound on the left medial upper calf. He has been to see his nephrologist who changed him to Demadex 40 mg a day. I'm hopeful this will help with his systemic fluid overload. He also has stage III lymphedema. Really no change in the 2 wounds since last week 01/11/18; the patient is down 13 pounds. He put his stage III lymphedema left leg in 4K compression last week and there is less edema fluid however we still haven't been able to communicate with home health but apparently it is [facility] but the dressings have not been changed. The  patient noted an odor last week. He is also had compression pumps ordered by [facility] this as a not completed the paperwork stage. 01/18/18; patient continues to lose weight. Stage III lymphedema in the left greater than right leg under for alert compression. The major wound is on the left lateral ankle area. He apparently has bilateral compression pumps being brought to his house, these were ordered by [facility] Notable for the fact today he had some blisters on the right anterior leg together with some skin nodules. This is no doubt secondary to severe lymphedema. 01/25/18; the patient has obtained his compression pumps and is using them per vein and vascular instructions 3 times a day for an hour. He also saw [name] of vein and vascular. He was felt to have venous insufficiency but did not suggest any intervention also improved edema. It was suggested that he have compression stockings 20-30 mm on a daily basis in addition to compression pumps. [name], [name] ([id number]) The patient arrives in clinic today with a layer of unna under for layer compression. he seems to have some trouble with the degree of compression. He has open areas on the left lateral ankle area which is his major wound left upper medial calf and a superficial open area on the right anterior shin area which was blistered last week. He has skin changes on the right anterior calf which I think are no doubt secondary to lymphedema skin nodules etc. 02/01/18; the patient comes in telling us his nephrologist have to his torsemide. Unfortunately today he is put on 7 pounds by our scales. He has blisters all over the anterior and medial part of his right calf and a new open wound. He also has soupy green drainage coming out of the left lateral calf /ankle wound. 02/08/18; culture I did last week of the left lateral ankle wound grew both Pseudomonas and Morganella. He is on Keflex from Dr. Ola [name] in the hospital. I will need to review these notes.in any case Keflex is not going to cover these 2 organisms. I'm probably going to added ciprofloxacin today for 1 week. A lot of drainage that looks purulent last week. He is not complaining of pain however he has managed to put on 10 pounds in 2 weeks by our scales in this clinic. He is going to see his nephrologist tomorrow 02/15/18; he completed the ciprofloxacin I gave him last week.  Notable that he is up to 379 pounds today which is up 16 pounds from 2 weeks ago. He is complaining of orthopnea but doesn't have any chest pain. 02/22/18; he continues to have weight gain. R intake nurse reports again purulent green drainage coming out of the lateral wound on the lateral left calf. He has small open area on the right anterior leg. His torsemide was increased to 2 tablets a day I believe this is 40 mg last week in response to the call admitted to Dr. Keturah Barre office. He follows up with [name] and Dr. Delana Meyer tomorrow 03/01/18 his weight essentially stable today at 383 pounds. Drainage out of the left lateral wound on the lower left calf/ankle is a lot less. Culture last time grew Pseudomonas. I put him on cefdinir. He has been to Dr. Keturah Barre office no adjustments in his diuretics. Dr. Nicoletta Dress prescribed a wraparound stocking for the right leg.there is no open area on the right leg. He has a superficial area on the left medial calf, left posterior  calf and in the large area on the left lateral however this looks better 03/15/18; weight is not up to 393 pounds. He saw his nephrologist yesterday [name] will increase the Demadex I'm hopeful this will help with the edema control. I'm using silver alginate to all his wounds. In particular the left lateral ankle looks better. oUnfortunately he has new open areas on the right lateral calf that will include use of his compression stocking at least in the short to medium term. He has new wounds o3 on the right lateral calf. One of these has some size however all numerous superficial 03/22/18; his weight is stabilized a bit. Just adjustment of his diuretics by his nephrologist. There is no doubt he has some degree of systemic fluid overload on top of severe left greater than right lymphedema. 2 weeks ago tried to transition him to stockings on the right leg however he developed re-breakdown of skin on the right leg and we had to put him back  in compression last week. He also uses external compression pumps and claims to be compliant Continued concern about his depression today 03/29/18; several ongoing issues with this patient; oHe no longer has home health coverage apparently secondary to a lapse in insurance. He is apparently transitioning from short for long-term disability. [facility] was changing his compression wraps on Monday and Friday. oHe has gained 10 pounds since last week oSees Dr. Ronalee [name] tomorrow oSaw his primary doctor last week about the depression. It sounds as though he declined pharmacologic management. He seems somewhat better today. We were concerned last week when he came. Somewhat better today oHe is using his compression pumps once a day at home, I have asked for twice a day if possible especially on the left leg oFollows up with his nephrologist in mid-August. He is managing his diuretic for I think stage IV chronic renal failure oParadoxically his wounds actually look better 04/19/18; the patient has not been seen since I last saw him 3 weeks ago. He saw Dr. Delana Meyer of vascular surgery on 03/30/18 I believe he put him in a 20/30 stocking bilaterally with a wraparound extremitease stocking. He has not been putting anything specifically on the wound. More problematic than that he has not been wearing the stockings he is at home. He has been using his external compression pumps once per day according to him on a rare occasion twice On a psychosocial level the patient is now on long-term disability and is applying for COBRA therefore he is between insurances. He has not been able to follow up with [name] who is his nephrologist as a result. As noted his weight is up to 407 pounds today. He promises me he'll follow-up with [name] 05/03/18; he hasn't been here in 2 weeks now. He apparently has been wearing a compression sock on the left leg. Massive increase in edema 3 large open wounds on the left anterior  leg that were probably blisters. Significant deterioration in the left lateral ankle wound that we've been doing as his most problematic wound. He still does not have his insurance issues wrapped up. His weight is well over 400 pounds. 05/10/18; arrives today with better looking edema control in the left leg. He has been using his compression pumps twice a day. We also wrapped it is left leg for there he's been using his pumps so there is much better edema control. Most of new [name], [name] ([id number]) wounds from last week look a  lot better. Even the refractory area on the lower left lateral ankle looks a lot better to me today. He follows up with [name] this morning [nephrology] 05/17/18 patient arrives today with a lot less edema in the left leg. His weight is gone down 7 pounds. He tells me he saw [name] but his torsemide was not adjusted. He is using the palms 3 times a day. There is been quite an improvement in the remaining wound on the left lateral ankle oWeekly visit for follow-up of bilateral lower extremity wounds related to severe lymphedema and probably some degree of systemic fluid overload from chronic renal failure stage IV. His weight is up this week to over 400 pounds. He noted increasing edema in the right leg late last week. He took his compression stocking off he did not increase the frequency of this compression pump use. He developed a large blister on the back of the right calf. He also has a new opened blister on the left lateral calf in addition to the wound that we've been using on the distal left lower calf area and we've been using silver alginate. He tells me that he has an ultrasound which is a DVT rule out and I think a reflux study ordered by Dr. Ronalee [name] 05/31/18; weekly visit. He went to see vein and vascular this week apparently they remove the 4-layer compression on the left and put an Unna boot on him in replacement. He's got more swelling in the left leg  is resolved. That being said his left lower Wound is better. He still has a fairly large wound on the right posterior calf this was not disturbed. He has a follow-up appointment with Dr. Keturah Barre nephrologist next week 06/07/2018; the patient arrives today with a history that he took both his compression wraps off 3 days ago in order to take a shower. He has bilateral severe tense blisters. A lot of weeping erythema especially on the lateral left calf. Almost circumferential blisters on the right. Multiple areas of epithelial breakdown. He does not complain of any pain fever chills. He states he has been using his compression pump in some form of stocking that I could not really determine the type. He did not come into the clinic with anything on his legs. He tells me he has an appointment with Dr. Merita Norton his nephrologist I believe this Friday. Paradoxically his weight is actually down to 385 pounds I believe last week he was over 400 06/14/18; arrives with better looking wound surfaces today on both legs. There is no further blistering however there are still areas that aren't epithelialized on the right anterior, right lateral, left posterior and left medial. His original wound just above the left ankle laterally is still open moist. His weight was about the same today. He tells me that his nephrologist increase the torsemide from 40/20/09/1939/40 twice a day 06/21/18; both his wraps fell down to his mid calf. Has a result he has multiple blisters across the anterior right leg above with the wraps ultimately watched. He also has blisters on the posterior part of the left calf o2. His original wound on the left lateral calf is hard to see any open area. There is however a divot. Which is going to be difficult to deal with into the future until the edema in the left leg is controlled. 2 small superficial areas remain He tells [place] his weight was 386 pounds on his scale at home. According to our scale he  is up 5 pounds. He is not on a fluid restriction 06/28/18; patient's weight is gone up 2 pounds since last time. He has weeping edema and open superficial wounds on the right posterior right lateral and right anterior calf. He has a small open area on the left anterior probably left posterior calf and the original wound on the left lateral calf appears to be just about closed He is being planned for a dialysis shunt in the right arm through [facility] incoordination with his nephrologist [name] He claims to be using his compression pumps twice a day. He has lymphedema and no doubt systemic fluid volume overload from stage IV chronic renal failure 07/04/18; patient's weight is up into the 396 range. He is supposed to see his cardiologist tomorrow and plans are being made for a shunt by Dr. Delana Meyer. He still has significant open wounds/draining areas on the right anterior and right posterior calf. Several small open areas on the left which are less in terms of wound area on the right which is surprising given the fact the left is the larger most lymphedematous leg 07/26/2018 She is seen today for follow-up and management of lateral posterior lower leg wound and bilateral lymphoedema. He has a very flat affect and depressed mood today. Unable to elicit much information from him today. No thoughts of harm to self identified. Recently had a follow-up with vascular vein for fistula placement to initiate dialysis in the right arm. He has 2 dressings on the right arm from the fistula procedure with no bleeding or drainage noted on the dressing. He does have a large amount of bruising in the surrounding to puncture areas on the right lower arm from the fistula placement. No pain elicited with palpation of the right arm. He denies a diminished sensation in that right lower arm as well. He does have a small wound to the left lateral posterior lower leg. No visual drainage present to either leg.  However the wound to the left lower leg does have a foul odor even after cleaning it. The left is the larger most lymphedematous leg with the right Leg still having a significant amount of edema. No drainage or blisters present on either leg today. He states that he is using the lymphedema pumps twice a day. Not too sure if he is compliant with actually using the lymphedema pumps based on the amount of edema that he has in his legs. Weight increased from 396.1 to 401.6. No recent fevers, chills, or shortness of breath. 08/02/18; the patient only has one remaining wound on the left lateral calf which is still open. This is in the resultant [facility]. ([id number]) shaped area which was once the site of the major wound. He does not have any open areas on the right leg nor additional wounds on the left no blistering. As usual the left leg is much larger than the right. He comes in today stating that he took the wraps off on the right leg on Friday and since then he has presumably been wearing his stocking which is a wraparound variant stocking on the right. States he took the 4-layer compression off his left leg this morning to shower. He is seeing vein and vascular tomorrow. He states he is using his compression pumps once or twice a day. He also admits that he has been on his feet a lot 08/09/18; the patient has the left leg healed except for the original wound site on  the left medial Just above the ankle. On the right he has a new wound on the right lateral calf. He saw vein and vascular last week and he is been back in his pressure stockings and wraparound stockings. He also is been put on metolazone 3 times a week by nephrology along with his torsemide. I'm hopeful that this will help with some of the lower extremity edema. I've always felt that he has lymphedema but there may be a secondary component contributing 08/16/18; the patient used his own junk slight stockings to both legs last  week. He claims he is pumping once to twice a day at home. Here rise with his legs looking visibly less edematous. His usual left greater than right secondary to lymphedema. He is tolerating the metolazone 3 times a week along with the torsemide. His weight was down 2 pounds. He follows up with nephrology soon for follow-up lab work I think in early January. He does not have any open areas on the right leg he still has the crevice on the lateral left calf just above the ankle there is 2 small open areas here I am not sure that this is ever going to be free of an open area although certainly not worsening. More problematic lead he has 2 blistered areas just above the wrist on the left lateral calf 09/06/2018; the patient arrives with much larger legs than I remember seeing a month ago especially on the right. He has 1 open area on the left lateral calf and he had a 4 layer compression on this on arrival in the clinic. This was apparently put on by [facility] in response to a new open wound. He claims he is pumping once or more often twice a day although I really have a hard time believing it. He came in with nothing but stockings on his legs. There is tremendous increase in the edema bilaterally Electronic Signature(s) Signed: 09/06/2018 5:09:58 PM By: Linton Ham MD Entered By: Linton Ham on 09/06/2018 17:04:22 Ruffolo, Tycho A. ([id number]) -------------------------------------------------------------------------------- Physical Exam Details Patient Name: Tello, Paxson A. Date of Service: 09/06/2018 1:45 PM Medical Record Number: [id number] Patient Account Number: [id number] Date of Birth/Sex: [birth year] (48 y.o. M) Treating RN: [name] Primary Care Provider: Gaetano [name] Other Clinician: Referring Provider: Gaetano [name] Treating Provider/Extender: [name] in Treatment: 10 Constitutional Patient is hypertensive.. Pulse regular and within target range for patient.[name]  Respirations regular, non-labored and within target range.. Temperature is normal and within the target range for the patient.[name] appears in no distress. Eyes Conjunctivae clear. No discharge. Respiratory Respiratory effort is easy and symmetric bilaterally. Rate is normal at rest and on room air.. Cardiovascular JVP is not elevated there is no coccyx edema. Even through lymphedema I am able to feel pulses in his legs. Massive lymphedema left greater than right. Lymphatic None palpable in the popliteal area. Integumentary (Hair, Skin) Other than the lymphedema chronic hemosiderin deposition in his lower legs bilaterally no primary skin issues are seen. Psychiatric Actually seems a bit better in terms of depressed affect. Notes Wound exam oThe only open areas on the left lateral calf x1. This is a clean wound probably a blister initially. It has some minor depth. oHe crevice the area just in the ankle area I do not think is anything that separate point to completely heal maybe until he goes on dialysis and seeing if we can get some fluid out of his legs nevertheless I cannot see a wound in  this area Electronic Signature(s) Signed: 09/06/2018 5:09:58 PM By: Linton Ham MD Entered By: Linton Ham on 09/06/2018 17:06:19 Minihan, Zavier AMarland Kitchen ([id number]) -------------------------------------------------------------------------------- Physician Orders Details Patient Name: Berns, Lebron A. Date of Service: 09/06/2018 1:45 PM Medical Record Number: [id number] Patient Account Number: [id number] Date of Birth/Sex: [birth year] (48 y.o. M) Treating RN: [name] Primary Care Provider: Gaetano [name] Other Clinician: Referring Provider: Gaetano [name] Treating Provider/Extender: [name] in Treatment: 65 Verbal / Phone Orders: No Diagnosis Coding Wound Cleansing Wound #2 Left,Lateral,Posterior Lower Leg o Clean wound with Normal Saline. o Cleanse wound with mild soap and  water Anesthetic (add to Medication List) Wound #2 Left,Lateral,Posterior Lower Leg o Topical Lidocaine 4% cream applied to wound bed prior to debridement (In Clinic Only). Skin Barriers/Peri-Wound Care Wound #2 Left,Lateral,Posterior Lower Leg o Barrier cream Primary Wound Dressing Wound #2 Left,Lateral,Posterior Lower Leg o Silver Alginate - on all draining areas Secondary Dressing Wound #2 Left,Lateral,Posterior Lower Leg o ABD pad Dressing Change Frequency Wound #2 Left,Lateral,Posterior Lower Leg o Change Dressing Monday, Wednesday, Friday - every other Wednesday in [facility] Follow-up Appointments Wound #2 Left,Lateral,Posterior Lower Leg o Return Appointment in 2 weeks. Edema Control Wound #2 Left,Lateral,Posterior Lower Leg o 4-Layer Compression System - Left Lower Extremity. o Patient to wear own Velcro compression garment. - right lower leg o Elevate legs to the level of the heart and pump ankles as often as possible o Compression Pump: Use compression pump on left lower extremity for 30 minutes, twice daily. - 1 Hour three times Daily o Compression Pump: Use compression pump on right lower extremity for 30 minutes, twice daily. - 1 Hour three times daily Home Health Wound #2 Left,Lateral,Posterior Lower Leg [place], [place] ([id number]) o Continue Home Health Visits - [facility]: Monday, Wednesday (when no appointment with wound care) and Friday. o Home Health Nurse may visit PRN to address patientos wound care needs. o FACE TO FACE ENCOUNTER: MEDICARE and MEDICAID PATIENTS: I certify that this patient is under my care and that I had a face-to-face encounter that meets the physician face-to-face encounter requirements with this patient on this date. The encounter with the patient was in whole or in part for the following MEDICAL CONDITION: (primary reason for Alfred) MEDICAL NECESSITY: I certify, that based on my  findings, NURSING services are a medically necessary home health service. HOME BOUND STATUS: I certify that my clinical findings support that this patient is homebound (i.e., Due to illness or injury, pt requires aid of supportive devices such as crutches, cane, wheelchairs, walkers, the use of special transportation or the assistance of another person to leave their place of residence. There is a normal inability to leave the home and doing so requires considerable and taxing effort. Other absences are for medical reasons / religious services and are infrequent or of short duration when for other reasons). o If current dressing causes regression in wound condition, may D/C ordered dressing product/s and apply Normal Saline Moist Dressing daily until next [place] / Other MD appointment. [place] of regression in wound condition at [phone]. o Please direct any NON-WOUND related issues/requests for orders to patient's Primary Care Physician Electronic Signature(s) Signed: 09/06/2018 5:09:58 PM By: Linton Ham MD Signed: 09/06/2018 5:48:26 PM By: [name] Entered By: [name] on 09/06/2018 14:39:26 Heitzenrater, Uziel A. ([id number]) -------------------------------------------------------------------------------- Problem List Details Patient Name: Henrie, Carmine A. Date of Service: 09/06/2018 1:45 PM Medical Record Number: [id number] Patient Account Number: [id number]  Date of Birth/Sex: [birth year] (48 y.o. 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 ([id number]) -------------------------------------------------------------------------------- Progress Note Details Patient Name: Greenwood, Jin A. Date of Service: 09/06/2018 1:45 PM Medical Record Number: [id number] Patient Account Number: [id number] Date of Birth/Sex: 02-27-1971 (48 y.o. M) Treating RN: [name] Primary Care Provider: Gaetano [name] Other Clinician: Referring Provider: Gaetano [name] Treating Provider/Extender: [name] in Treatment: 36 Subjective History of Present Illness (HPI) 12/28/17; this is a now 48 year old man who is a type II diabetic. He was hospitalized from 10/01/17 through 10/19/17. He had an MSSA soft tissue and skin infection. 2 open areas on the left leg were identified he has a smaller area on the left medial calf superiorly just below the knee and a wound just above the left ankle on the posterior medial aspect. I think both of these were surgical IandD sites when he was in the hospital. He was discharged with a wound VAC at that point however this is since been taken off. He follows with Dr. Ola [name] for the [facility] and he is still on chronic Keflex at 500 twice a day. At that time he was hospitalized his hemoglobin A1c was 15.1 however if I'm reading his endocrinologist notes correctly that is improved. He has been following with Dr. Ronalee [name] at vein and vascular and he has been applying calcium alginate and Unna boots. He has home health changing the dressing. They have also been attempting to get him external  compression pumps although the patient is unaware whether they've been approved by insurance at this point. as mentioned he has a smaller clean wound on the right lateral calf just below the knee and he has a much larger area just above the left ankle medially and posteriorly. Our intake nurse reported greenish purulent looking drainage.the patient did have surgical material sent to pathology in February. This showed chronic abscess The patient also has lymphedema stage III in the left greater than right lower extremities. He has a history of blisters with wounds but these of all were always healed. The patient thinks that the lymphedema may have been present since he was about 48 years old i.e. about 30 years. He does not have graded pressure stockings and has not worn stockings. He does not have a distant history of DVT PE or phlebitis. He has not been systemically unwell fever no chills. He states that his Lasix is recently been reduced. He tells me his kidney function is at "30%" and he has been followed by [name] of nephrology. At one point he was on Lasix 80 twice a day however that's been cut back and he is now on Lasix at 20 twice a day. The patient has a history of PAD listed in his records although he comes from Dr. Ronalee [name] I don't think  is felt to have significant PAD. ABIs in our clinic were noncompressible bilaterally. 01/04/18; patient has a large wound on the left lateral lower calf and a small wound on the left medial upper calf. He has been to see his nephrologist who changed him to Demadex 40 mg a day. I'm hopeful this will help with his systemic fluid overload. He also has stage III lymphedema. Really no change in the 2 wounds since last week 01/11/18; the patient is down 13 pounds. He put his stage III lymphedema left leg in 4K compression last week and there is less edema fluid however we still haven't been able to communicate with home health but apparently it is [facility] but  the dressings have not been changed. The patient noted an odor last week. He is also had compression pumps ordered by [facility] this as a not completed the paperwork stage. 01/18/18; patient continues to lose weight. Stage III lymphedema in the left greater than right leg under for alert compression. The major wound is on the left lateral ankle area. He apparently has bilateral compression pumps being brought to his house, these were ordered by [facility] Notable for the fact today he had some blisters on the right anterior leg together with some skin nodules. This is no doubt secondary to severe lymphedema. 01/25/18; the patient has obtained his compression pumps and is using them per vein and vascular instructions 3 times a day for an hour. He also saw [name] of vein and vascular. He was felt to have venous insufficiency but did not suggest any intervention also improved edema. It was suggested that he have compression stockings 20-30 mm on a daily basis in addition to compression pumps. The patient arrives in clinic today with a layer of unna under for layer compression. he seems to have some trouble with the degree of compression. He has open areas on the left lateral ankle area which is his major wound left upper medial calf and a Iacobucci, Jahbari A. ([id number]) superficial open area on the right anterior shin area which was blistered last week. He has skin changes on the right anterior calf which I think are no doubt secondary to lymphedema skin nodules etc. 02/01/18; the patient comes in telling us his nephrologist have to his torsemide. Unfortunately today he is put on 7 pounds by our scales. He has blisters all over the anterior and medial part of his right calf and a new open wound. He also has soupy green drainage coming out of the left lateral calf /ankle wound. 02/08/18; culture I did last week of the left lateral ankle wound grew both Pseudomonas and  Morganella. He is on Keflex from Dr. Ola [name] in the hospital. I will need to review these notes.in any case Keflex is not going to cover these 2 organisms. I'm probably going to added ciprofloxacin today for 1 week. A lot of drainage that looks purulent last week. He is not complaining of pain however he has managed to put on 10 pounds in 2 weeks by our scales in this clinic. He is going to see his nephrologist tomorrow 02/15/18; he completed the ciprofloxacin I gave him last week. Notable that he is up to 379 pounds today which is up 16 pounds from 2 weeks ago. He is complaining of orthopnea but doesn't have any chest pain. 02/22/18; he continues to have weight gain. R intake nurse reports again purulent green drainage coming out of the lateral wound on the  lateral left calf. He has small open area on the right anterior leg. His torsemide was increased to 2 tablets a day I believe this is 40 mg last week in response to the call admitted to Dr. Keturah Barre office. He follows up with [name] and Dr. Delana Meyer tomorrow 03/01/18 his weight essentially stable today at 383 pounds. Drainage out of the left lateral wound on the lower left calf/ankle is a lot less. Culture last time grew Pseudomonas. I put him on cefdinir. He has been to Dr. Keturah Barre office no adjustments in his diuretics. Dr. Nicoletta Dress prescribed a wraparound stocking for the right leg.there is no open area on the right leg. He has a superficial area on the left medial calf, left posterior calf and in the large area on the left lateral however this looks better 03/15/18; weight is not up to 393 pounds. He saw his nephrologist yesterday [name] will increase the Demadex I'm hopeful this will help with the edema control. I'm using silver alginate to all his wounds. In particular the left lateral ankle looks better. Unfortunately he has new open areas on the right lateral calf that will include use of his compression stocking at least in the short  to medium term. He has new wounds o3 on the right lateral calf. One of these has some size however all numerous superficial 03/22/18; his weight is stabilized a bit. Just adjustment of his diuretics by his nephrologist. There is no doubt he has some degree of systemic fluid overload on top of severe left greater than right lymphedema. 2 weeks ago tried to transition him to stockings on the right leg however he developed re-breakdown of skin on the right leg and we had to put him back in compression last week. He also uses external compression pumps and claims to be compliant Continued concern about his depression today 03/29/18; several ongoing issues with this patient; He no longer has home health coverage apparently secondary to a lapse in insurance. He is apparently transitioning from short for long-term disability. [facility] was changing his compression wraps on Monday and Friday. He has gained 10 pounds since last week Sees Dr. Ronalee [name] tomorrow Saw his primary doctor last week about the depression. It sounds as though he declined pharmacologic management. He seems somewhat better today. We were concerned last week when he came. Somewhat better today He is using his compression pumps once a day at home, I have asked for twice a day if possible especially on the left leg Follows up with his nephrologist in mid-August. He is managing his diuretic for I think stage IV chronic renal failure Paradoxically his wounds actually look better 04/19/18; the patient has not been seen since I last saw him 3 weeks ago. He saw Dr. Delana Meyer of vascular surgery on 03/30/18 I believe he put him in a 20/30 stocking bilaterally with a wraparound extremitease stocking. He has not been putting anything specifically on the wound. More problematic than that he has not been wearing the stockings he is at home. He has been using his external compression pumps once per day according to him on a rare occasion twice On  a psychosocial level the patient is now on long-term disability and is applying for COBRA therefore he is between insurances. He has not been able to follow up with [name] who is his nephrologist as a result. As noted his weight is up to 407 pounds today. He promises me he'll follow-up with [name] 05/03/18;  he hasn't been here in 2 weeks now. He apparently has been wearing a compression sock on the left leg. Massive increase in edema 3 large open wounds on the left anterior leg that were probably blisters. Significant deterioration in the left lateral ankle wound that we've been doing as his most problematic wound. He still does not have his insurance issues wrapped up. His weight is well over 400 pounds. 05/10/18; arrives today with better looking edema control in the left leg. He has been using his compression pumps twice a day. We also wrapped it is left leg for there he's been using his pumps so there is much better edema control. Most of new wounds from last week look a lot better. Even the refractory area on the lower left lateral ankle looks a lot better to me today. He follows up with [name] this morning [nephrology] 05/17/18 patient arrives today with a lot less edema in the left leg. His weight is gone down 7 pounds. He tells me he saw Dr. Helene Kelp, Davied A. ([id number]) Candiss [name] but his torsemide was not adjusted. He is using the palms 3 times a day. There is been quite an improvement in the remaining wound on the left lateral ankle Weekly visit for follow-up of bilateral lower extremity wounds related to severe lymphedema and probably some degree of systemic fluid overload from chronic renal failure stage IV. His weight is up this week to over 400 pounds. He noted increasing edema in the right leg late last week. He took his compression stocking off he did not increase the frequency of this compression pump use. He developed a large blister on the back of the right calf. He also has a new  opened blister on the left lateral calf in addition to the wound that we've been using on the distal left lower calf area and we've been using silver alginate. He tells me that he has an ultrasound which is a DVT rule out and I think a reflux study ordered by Dr. Ronalee [name] 05/31/18; weekly visit. He went to see vein and vascular this week apparently they remove the 4-layer compression on the left and put an Unna boot on him in replacement. He's got more swelling in the left leg is resolved. That being said his left lower Wound is better. He still has a fairly large wound on the right posterior calf this was not disturbed. He has a follow-up appointment with Dr. Keturah Barre nephrologist next week 06/07/2018; the patient arrives today with a history that he took both his compression wraps off 3 days ago in order to take a shower. He has bilateral severe tense blisters. A lot of weeping erythema especially on the lateral left calf. Almost circumferential blisters on the right. Multiple areas of epithelial breakdown. He does not complain of any pain fever chills. He states he has been using his compression pump in some form of stocking that I could not really determine the type. He did not come into the clinic with anything on his legs. He tells me he has an appointment with Dr. Merita Norton his nephrologist I believe this Friday. Paradoxically his weight is actually down to 385 pounds I believe last week he was over 400 06/14/18; arrives with better looking wound surfaces today on both legs. There is no further blistering however there are still areas that aren't epithelialized on the right anterior, right lateral, left posterior and left medial. His original wound just above the left ankle laterally is still open  moist. His weight was about the same today. He tells me that his nephrologist increase the torsemide from 40/20/09/1939/40 twice a day 06/21/18; both his wraps fell down to his mid calf. Has a result he has  multiple blisters across the anterior right leg above with the wraps ultimately watched. He also has blisters on the posterior part of the left calf o2. His original wound on the left lateral calf is hard to see any open area. There is however a divot. Which is going to be difficult to deal with into the future until the edema in the left leg is controlled. 2 small superficial areas remain He tells [place] his weight was 386 pounds on his scale at home. According to our scale he is up 5 pounds. He is not on a fluid restriction 06/28/18; patient's weight is gone up 2 pounds since last time. He has weeping edema and open superficial wounds on the right posterior right lateral and right anterior calf. He has a small open area on the left anterior probably left posterior calf and the original wound on the left lateral calf appears to be just about closed He is being planned for a dialysis shunt in the right arm through [facility] incoordination with his nephrologist [name] He claims to be using his compression pumps twice a day. He has lymphedema and no doubt systemic fluid volume overload from stage IV chronic renal failure 07/04/18; patient's weight is up into the 396 range. He is supposed to see his cardiologist tomorrow and plans are being made for a shunt by Dr. Delana Meyer. He still has significant open wounds/draining areas on the right anterior and right posterior calf. Several small open areas on the left which are less in terms of wound area on the right which is surprising given the fact the left is the larger most lymphedematous leg 07/26/2018 She is seen today for follow-up and management of lateral posterior lower leg wound and bilateral lymphoedema. He has a very flat affect and depressed mood today. Unable to elicit much information from him today. No thoughts of harm to self identified. Recently had a follow-up with vascular vein for fistula placement to initiate dialysis in the  right arm. He has 2 dressings on the right arm from the fistula procedure with no bleeding or drainage noted on the dressing. He does have a large amount of bruising in the surrounding to puncture areas on the right lower arm from the fistula placement. No pain elicited with palpation of the right arm. He denies a diminished sensation in that right lower arm as well. He does have a small wound to the left lateral posterior lower leg. No visual drainage present to either leg. However the wound to the left lower leg does have a foul odor even after cleaning it. The left is the larger most lymphedematous leg with the right Leg still having a significant amount of edema. No drainage or blisters present on either leg today. He states that he is using the lymphedema pumps twice a day. Not too sure if he is compliant with actually using the lymphedema pumps based on the amount of edema that he has in his legs. Weight increased from 396.1 to 401.6. No recent fevers, chills, or shortness of breath. 08/02/18; the patient only has one remaining wound on the left lateral calf which is still open. This is in the resultant [place] shaped area which was once the site of the major wound. He does not  have any open areas on the right leg nor additional wounds on the left no blistering. As usual the left leg is much larger than the right. Burandt, Suhas A. ([id number]) He comes in today stating that he took the wraps off on the right leg on Friday and since then he has presumably been wearing his stocking which is a wraparound variant stocking on the right. States he took the 4-layer compression off his left leg this morning to shower. He is seeing vein and vascular tomorrow. He states he is using his compression pumps once or twice a day. He also admits that he has been on his feet a lot 08/09/18; the patient has the left leg healed except for the original wound site on the left medial Just above the ankle. On the right  he has a new wound on the right lateral calf. He saw vein and vascular last week and he is been back in his pressure stockings and wraparound stockings. He also is been put on metolazone 3 times a week by nephrology along with his torsemide. I'm hopeful that this will help with some of the lower extremity edema. I've always felt that he has lymphedema but there may be a secondary component contributing 08/16/18; the patient used his own junk slight stockings to both legs last week. He claims he is pumping once to twice a day at home. Here rise with his legs looking visibly less edematous. His usual left greater than right secondary to lymphedema. He is tolerating the metolazone 3 times a week along with the torsemide. His weight was down 2 pounds. He follows up with nephrology soon for follow-up lab work I think in early January. He does not have any open areas on the right leg he still has the crevice on the lateral left calf just above the ankle there is 2 small open areas here I am not sure that this is ever going to be free of an open area although certainly not worsening. More problematic lead he has 2 blistered areas just above the wrist on the left lateral calf 09/06/2018; the patient arrives with much larger legs than I remember seeing a month ago especially on the right. He has 1 open area on the left lateral calf and he had a 4 layer compression on this on arrival in the clinic. This was apparently put on by [facility] in response to a new open wound. He claims he is pumping once or more often twice a day although I really have a hard time believing it. He came in with nothing but stockings on his legs. There is tremendous increase in the edema bilaterally Objective Constitutional Patient is hypertensive.. Pulse regular and within target range for patient.[name] Respirations regular, non-labored and within target range.. Temperature is normal and within the target range for the patient.[name] appears  in no distress. Vitals Time Taken: 1:42 PM, Height: 76 in, Weight: 401.6 lbs, BMI: 48.9, Temperature: 98.2 F, Pulse: 61 bpm, Respiratory Rate: 18 breaths/min, Blood Pressure: 164/74 mmHg. Eyes Conjunctivae clear. No discharge. Respiratory Respiratory effort is easy and symmetric bilaterally. Rate is normal at rest and on room air.. Cardiovascular JVP is not elevated there is no coccyx edema. Even through lymphedema I am able to feel pulses in his legs. Massive lymphedema left greater than right. Lymphatic None palpable in the popliteal area. Psychiatric Actually seems a bit better in terms of depressed affect. General Notes: Wound exam The only open areas on the left lateral calf  x1. This is a clean wound probably a blister initially. It has some minor depth. He crevice the area just in the ankle area I do not think is anything that separate point to [place], [place]. ([id number]) completely heal maybe until he goes on dialysis and seeing if we can get some fluid out of his legs nevertheless I cannot see a wound in this area Integumentary (Hair, Skin) Other than the lymphedema chronic hemosiderin deposition in his lower legs bilaterally no primary skin issues are seen. Wound #2 status is Open. Original cause of wound was Gradually Appeared. The wound is located on the Left,Lateral,Posterior Lower Leg. The wound measures 1cm length x 4cm width x 0.2cm depth; 3.142cm^2 area and 0.628cm^3 volume. There is Fat Layer (Subcutaneous Tissue) Exposed exposed. There is no tunneling or undermining noted. There is a small amount of serous drainage noted. The wound margin is indistinct and nonvisible. There is no granulation within the wound bed. There is a small (1-33%) amount of necrotic tissue within the wound bed including Adherent Slough. The periwound skin appearance did not exhibit: Callus, Crepitus, Excoriation, Induration, Rash, Scarring, Dry/Scaly, Maceration, Atrophie Blanche, Cyanosis,  Ecchymosis, Hemosiderin Staining, Mottled, Pallor, Rubor, Erythema. Periwound temperature was noted as No Abnormality. Assessment Active Problems ICD-10 Non-pressure chronic ulcer of left calf with necrosis of muscle Lymphedema, not elsewhere classified Chronic venous hypertension (idiopathic) with ulcer and inflammation of left lower extremity Type 2 diabetes mellitus with other skin ulcer Non-pressure chronic ulcer of right calf limited to breakdown of skin Diagnoses ICD-10 L97.223: Non-pressure chronic ulcer of left calf with necrosis of muscle I89.0: Lymphedema, not elsewhere classified I87.332: Chronic venous hypertension (idiopathic) with ulcer and inflammation of left lower extremity E11.622: Type 2 diabetes mellitus with other skin ulcer L97.211: Non-pressure chronic ulcer of right calf limited to breakdown of skin Procedures Wound #2 Pre-procedure diagnosis of Wound #2 is a Diabetic Wound/Ulcer of the Lower Extremity located on the Left,Lateral,Posterior Lower Leg . There was a Four Layer Compression Therapy Procedure by [name] Hora, RN. Post procedure Diagnosis Wound #2: Same as Pre-Procedure Plan Wound Cleansing: Wound #2 Left,Lateral,Posterior Lower Leg: Risdon, Nayan A. ([id number]) Clean wound with Normal Saline. Cleanse wound with mild soap and water Anesthetic (add to Medication List): Wound #2 Left,Lateral,Posterior Lower Leg: Topical Lidocaine 4% cream applied to wound bed prior to debridement (In Clinic Only). Skin Barriers/Peri-Wound Care: Wound #2 Left,Lateral,Posterior Lower Leg: Barrier cream Primary Wound Dressing: Wound #2 Left,Lateral,Posterior Lower Leg: Silver Alginate - on all draining areas Secondary Dressing: Wound #2 Left,Lateral,Posterior Lower Leg: ABD pad Dressing Change Frequency: Wound #2 Left,Lateral,Posterior Lower Leg: Change Dressing Monday, Wednesday, Friday - every other Wednesday in [facility] Follow-up  Appointments: Wound #2 Left,Lateral,Posterior Lower Leg: Return Appointment in 2 weeks. Edema Control: Wound #2 Left,Lateral,Posterior Lower Leg: 4-Layer Compression System - Left Lower Extremity. Patient to wear own Velcro compression garment. - right lower leg Elevate legs to the level of the heart and pump ankles as often as possible Compression Pump: Use compression pump on left lower extremity for 30 minutes, twice daily. - 1 Hour three times Daily Compression Pump: Use compression pump on right lower extremity for 30 minutes, twice daily. - 1 Hour three times daily Home Health: Wound #2 Left,Lateral,Posterior Lower Leg: Continue Home Health Visits - [facility]: Monday, Wednesday (when no appointment with wound care) and Friday. Home Health Nurse may visit PRN to address patient s wound care needs. FACE TO FACE ENCOUNTER: MEDICARE and MEDICAID PATIENTS: I certify that  this patient is under my care and that I had a face-to-face encounter that meets the physician face-to-face encounter requirements with this patient on this date. The encounter with the patient was in whole or in part for the following MEDICAL CONDITION: (primary reason for [place]) MEDICAL NECESSITY: I certify, that based on my findings, NURSING services are a medically necessary home health service. HOME BOUND STATUS: I certify that my clinical findings support that this patient is homebound (i.e., Due to illness or injury, pt requires aid of supportive devices such as crutches, cane, wheelchairs, walkers, the use of special transportation or the assistance of another person to leave their place of residence. There is a normal inability to leave the home and doing so requires considerable and taxing effort. Other absences are for medical reasons / religious services and are infrequent or of short duration when for other reasons). If current dressing causes regression in wound condition, may D/C ordered dressing  product/s and apply Normal Saline Moist Dressing daily until next [place] / Other MD appointment. [place] of regression in wound condition at [phone]. Please direct any NON-WOUND related issues/requests for orders to patient's Primary Care Physician 1 we put silver alginate/ABDs/4-layer compression on the left leg 2. We told him to go back to the stocking on the right. I think he has several types of stockings at home I am not convinced he is wearing them. I emphasized using the compression pumps are all of this is going to breakdown 3. As I understand things he may have been pending starting of dialysis be interesting to see if this helps the swelling in his legs Electronic Signature(s) Signed: 09/06/2018 5:09:58 PM By: Linton Ham MD Entered By: Linton Ham on 09/06/2018 17:07:25 Hesketh, Stoy A. ([id number]) Winchester, Harrison ([id number]) -------------------------------------------------------------------------------- SuperBill Details Patient Name: Marquina, Inmer A. Date of Service: 09/06/2018 Medical Record Number: [id number] Patient Account Number: [id number] Date of Birth/Sex: Sep 01, 1970 (48 y.o. M) Treating RN: [name] Primary Care Provider: Gaetano [name] Other Clinician: Referring Provider: Gaetano [name] Treating Provider/Extender: [name] in Treatment: 36 Diagnosis Coding ICD-10 Codes Code Description [phone] Non-pressure chronic ulcer of left calf with necrosis of muscle I89.0 Lymphedema, not elsewhere classified I87.332 Chronic venous hypertension (idiopathic) with ulcer and inflammation of left lower extremity E11.622 Type 2 diabetes mellitus with other skin ulcer L97.211 Non-pressure chronic ulcer of right calf limited to breakdown of skin Facility Procedures CPT4 Code: [id number] Description: (Facility Use Only) [phone] - [place] LWR LT LEG Modifier: Quantity: 1 Physician Procedures CPT4:  Description Modifier Quantity Code [phone] [id number] - WC PHYS LEVEL 3 - EST PT 1 ICD-10 Diagnosis Description L97.223 Non-pressure chronic ulcer of left calf with necrosis of muscle I89.0 Lymphedema, not elsewhere classified I87.332 Chronic venous  hypertension (idiopathic) with ulcer and inflammation of left lower extremity Electronic Signature(s) Signed: 09/06/2018 5:09:58 PM By: Linton Ham MD Previous Signature: 09/06/2018 2:55:49 PM Version By: [name] Entered By: Linton Ham on 09/06/2018 17:07:51

## 2018-09-07 NOTE — Progress Notes (Signed)
DMANI, MIZER ([id number]) Visit Report for 09/06/2018 Arrival Information Details Patient Name: [name], [name] Date of Service: 09/06/2018 1:45 PM Medical Record Number: [id number] Patient Account Number: [id number] Date of Birth/Sex: 1971/04/04 (48 y.o. M) Treating RN: Montey Hora Primary Care Aronda Burford: Gaetano Net Other Clinician: Referring Tommy Minichiello: Gaetano Net Treating Iann Rodier/Extender: Tito Dine in Treatment: 50 Visit Information History Since Last Visit Added or deleted any medications: No Patient Arrived: Ambulatory Any new allergies or adverse reactions: No Arrival Time: 13:40 Had a fall or experienced change in No Accompanied By: self activities of daily living that may affect Transfer Assistance: None risk of falls: Patient Identification Verified: Yes Signs or symptoms of abuse/neglect since last visito No Secondary Verification Process Yes Hospitalized since last visit: No Completed: Implantable device outside of the clinic excluding No Patient Requires Transmission-Based No cellular tissue based products placed in the center Precautions: since last visit: Patient Has Alerts: Yes Pain Present Now: No Patient Alerts: DM II noncompressible bilateral Electronic Signature(s) Signed: 09/06/2018 4:30:10 PM By: Lorine Bears RCP, RRT, CHT Entered By: Lorine Bears on 09/06/2018 13:41:53 Doddridge, Tarance A. ([id number]) -------------------------------------------------------------------------------- Compression Therapy Details Patient Name: [name], [name] Date of Service: 09/06/2018 1:45 PM Medical Record Number: [id number] Patient Account Number: [id number] Date of Birth/Sex: [birth year] (48 y.o. M) Treating RN: Montey Hora Primary Care Zakiyyah Savannah: Gaetano Net Other Clinician: Referring Romone Shaff: Gaetano Net Treating Yaritzel Stange/Extender: Tito Dine in Treatment: 36 Compression Therapy Performed for Wound  Assessment: Wound #2 Left,Lateral,Posterior Lower Leg Performed By: Clinician Montey Hora, RN Compression Type: Four Layer Post Procedure Diagnosis Same as Pre-procedure Electronic Signature(s) Signed: 09/06/2018 2:55:37 PM By: Montey Hora Entered By: Montey Hora on 09/06/2018 14:55:37 Jeanpaul, Filbert A. ([id number]) -------------------------------------------------------------------------------- Encounter Discharge Information Details Patient Name: [name], [name] Date of Service: 09/06/2018 1:45 PM Medical Record Number: [id number] Patient Account Number: [id number] Date of Birth/Sex: Jun 22, 1971 (48 y.o. M) Treating RN: Montey Hora Primary Care Sylvanna Burggraf: Gaetano Net Other Clinician: Referring Ulysses Alper: Gaetano Net Treating Trew Sunde/Extender: Tito Dine in Treatment: 58 Encounter Discharge Information Items Discharge Condition: Stable Ambulatory Status: Ambulatory Discharge Destination: Home Transportation: Private Auto Accompanied By: self Schedule Follow-up Appointment: Yes Clinical Summary of Care: Electronic Signature(s) Signed: 09/06/2018 2:56:38 PM By: Montey Hora Entered By: Montey Hora on 09/06/2018 14:56:37 Bisson, [name] ([id number]) -------------------------------------------------------------------------------- Lower Extremity Assessment Details Patient Name: [name], [name] Date of Service: 09/06/2018 1:45 PM Medical Record Number: [id number] Patient Account Number: [id number] Date of Birth/Sex: [birth year] (48 y.o. M) Treating RN: Secundino Ginger Primary Care Zoriah Pulice: Gaetano Net Other Clinician: Referring Demica Zook: Gaetano Net Treating Jeffren Dombek/Extender: Tito Dine in Treatment: 36 Edema Assessment Assessed: [Left: No] [Right: No] [Left: Edema] [Right: :] Calf Left: Right: Point of Measurement: 36 cm From Medial Instep 60 cm 55 cm Ankle Left: Right: Point of Measurement: 14 cm From Medial Instep 48 cm 39 cm Vascular  Assessment Claudication: Claudication Assessment [Left:None] [Right:None] Pulses: Dorsalis Pedis Palpable: [Left:Yes] [Right:Yes] Posterior Tibial Extremity colors, hair growth, and conditions: Extremity Color: [Left:Hyperpigmented] [Right:Hyperpigmented] Hair Growth on Extremity: [Left:No] [Right:No] Temperature of Extremity: [Left:Warm] [Right:Warm] Toe Nail Assessment Left: Right: Thick: Yes Yes Discolored: Yes Yes Deformed: Yes Yes Improper Length and Hygiene: Yes Yes Electronic Signature(s) Signed: 09/06/2018 4:13:45 PM By: Secundino Ginger Entered By: Secundino Ginger on 09/06/2018 14:26:47 Bayard, Rishik A. ([id number]) -------------------------------------------------------------------------------- Multi Wound Chart Details Patient Name: [name], [name] Date of Service: 09/06/2018 1:45 PM Medical Record Number: [id number] Patient Account Number: [id number] Date of Birth/Sex:  1971/01/10 (48 y.o. M) Treating RN: Montey Hora Primary Care Elija Mccamish: Gaetano Net Other Clinician: Referring Alannah Averhart: Gaetano Net Treating Osceola Holian/Extender: Tito Dine in Treatment: 36 Vital Signs Height(in): 76 Pulse(bpm): 61 Weight(lbs): 401.6 Blood Pressure(mmHg): 164/74 Body Mass Index(BMI): 49 Temperature(F): 98.2 Respiratory Rate 18 (breaths/min): Photos: [N/A:N/A] Wound Location: Left Lower Leg - Lateral, N/A N/A Posterior Wounding Event: Gradually Appeared N/A N/A Primary Etiology: Diabetic Wound/Ulcer of the N/A N/A Lower Extremity Secondary Etiology: Lymphedema N/A N/A Comorbid History: Hypertension, Type II N/A N/A Diabetes, Neuropathy Date Acquired: 09/30/2017 N/A N/A Weeks of Treatment: 36 N/A N/A Wound Status: Open N/A N/A Measurements L x W x D 1x4x0.2 N/A N/A (cm) Area (cm) : 3.142 N/A N/A Volume (cm) : 0.628 N/A N/A % Reduction in Area: 88.90% N/A N/A % Reduction in Volume: 94.40% N/A N/A Classification: Grade 2 N/A N/A Exudate Amount: Small N/A  N/A Exudate Type: Serous N/A N/A Exudate Color: amber N/A N/A Wound Margin: Indistinct, nonvisible N/A N/A Granulation Amount: None Present (0%) N/A N/A Necrotic Amount: Small (1-33%) N/A N/A Exposed Structures: Fat Layer (Subcutaneous N/A N/A Tissue) Exposed: Yes Fascia: No Tendon: No Muscle: No Levesque, Zakariyah A. ([id number]) Joint: No Bone: No Epithelialization: None N/A N/A Periwound Skin Texture: Excoriation: No N/A N/A Induration: No Callus: No Crepitus: No Rash: No Scarring: No Periwound Skin Moisture: Maceration: No N/A N/A Dry/Scaly: No Periwound Skin Color: Atrophie Blanche: No N/A N/A Cyanosis: No Ecchymosis: No Erythema: No Hemosiderin Staining: No Mottled: No Pallor: No Rubor: No Temperature: No Abnormality N/A N/A Tenderness on Palpation: No N/A N/A Wound Preparation: Ulcer Cleansing: N/A N/A Rinsed/Irrigated with Saline Topical Anesthetic Applied: Other: lidocaine 4% Procedures Performed: Compression Therapy N/A N/A Treatment Notes Wound #2 (Left, Lateral, Posterior Lower Leg) Notes Silvercell,gauze, 4 layer left and patient's own compression wrap on the right when he gets home Electronic Signature(s) Signed: 09/06/2018 5:09:58 PM By: Linton Ham MD Entered By: Linton Ham on 09/06/2018 15:41:44 Corter, Micaiah AMarland Kitchen ([id number]) -------------------------------------------------------------------------------- [place] Details Patient Name: [name], [name] Date of Service: 09/06/2018 1:45 PM Medical Record Number: [id number] Patient Account Number: [id number] Date of Birth/Sex: 1970/11/17 (48 y.o. M) Treating RN: Montey Hora Primary Care Azariah Latendresse: Gaetano Net Other Clinician: Referring Sapir Lavey: Gaetano Net Treating Tiffiney Sparrow/Extender: Tito Dine in Treatment: 3 Active Inactive Abuse / Safety / Falls / Self Care Management Nursing Diagnoses: Potential for falls Goals: Patient will remain injury free related  to falls Date Initiated: 12/28/2017 Target Resolution Date: 04/08/2018 Goal Status: Active Interventions: Assess fall risk on admission and as needed Notes: Nutrition Nursing Diagnoses: Impaired glucose control: actual or potential Goals: Patient/caregiver verbalizes understanding of need to maintain therapeutic glucose control per primary care physician Date Initiated: 12/28/2017 Target Resolution Date: 04/08/2018 Goal Status: Active Interventions: Provide education on elevated blood sugars and impact on wound healing Notes: Orientation to the Wound Care Program Nursing Diagnoses: Knowledge deficit related to the wound healing center program Goals: Patient/caregiver will verbalize understanding of the [facility] Program Date Initiated: 12/28/2017 Target Resolution Date: 04/08/2018 Goal Status: Active Interventions: Provide education on orientation to the wound center [name], [name] ([id number]) Notes: Venous Leg Ulcer Nursing Diagnoses: Knowledge deficit related to disease process and management Goals: Patient will maintain optimal edema control Date Initiated: 03/27/2018 Target Resolution Date: 04/27/2018 Goal Status: Active Interventions: Assess peripheral edema status every visit. Compression as ordered Treatment Activities: Therapeutic compression applied : 03/22/2018 Notes: Wound/Skin Impairment Nursing Diagnoses: Impaired tissue integrity Goals: Ulcer/skin breakdown will heal within  14 weeks Date Initiated: 12/28/2017 Target Resolution Date: 04/08/2018 Goal Status: Active Interventions: Assess patient/caregiver ability to obtain necessary supplies Assess patient/caregiver ability to perform ulcer/skin care regimen upon admission and as needed Assess ulceration(s) every visit Notes: Electronic Signature(s) Signed: 09/06/2018 5:48:26 PM By: Montey Hora Entered By: Montey Hora on 09/06/2018 14:34:48 Care, Sopheap A.  ([id number]) -------------------------------------------------------------------------------- Non-Wound Condition Assessment Details Patient Name: [name], [name] Date of Service: 09/06/2018 1:45 PM Medical Record Number: [id number] Patient Account Number: [id number] Date of Birth/Sex: Feb 24, 1971 (48 y.o. M) Treating RN: Secundino Ginger Primary Care Denman Pichardo: Gaetano Net Other Clinician: Referring Tadarrius Burch: Gaetano Net Treating [place]/Extender: Tito Dine in Treatment: 36 Non-Wound Condition: Condition: Lymphedema Location: Leg Side: Bilateral Periwound Skin Texture Texture Color No Abnormalities Noted: No No Abnormalities Noted: No Callus: No Atrophie Blanche: No Crepitus: No Cyanosis: No Excoriation: Yes Ecchymosis: No Friable: No Erythema: No Induration: Yes Hemosiderin Staining: Yes Rash: No Mottled: No Scarring: No Pallor: No Rubor: Yes Moisture No Abnormalities Noted: No Dry / Scaly: No Maceration: No Electronic Signature(s) Signed: 09/06/2018 4:13:45 PM By: Secundino Ginger Entered By: Secundino Ginger on 09/06/2018 14:47:03 [name], [name] ([id number]) -------------------------------------------------------------------------------- Pain Assessment Details Patient Name: [name], [name] Date of Service: 09/06/2018 1:45 PM Medical Record Number: [id number] Patient Account Number: [id number] Date of Birth/Sex: Dec 01, 1970 (48 y.o. M) Treating RN: Montey Hora Primary Care Charmon Thorson: Gaetano Net Other Clinician: Referring Ethelmae Ringel: Gaetano Net Treating Abena Erdman/Extender: Tito Dine in Treatment: 86 Active Problems Location of Pain Severity and Description of Pain Patient Has Paino No Site Locations Pain Management and Medication Current Pain Management: Electronic Signature(s) Signed: 09/06/2018 4:30:10 PM By: Paulla Fore, RRT, CHT Signed: 09/06/2018 5:48:26 PM By: Montey Hora Entered By: Lorine Bears on  09/06/2018 13:42:01 [name], [name] ([id number]) -------------------------------------------------------------------------------- Patient/Caregiver Education Details Patient Name: [name], [name] Date of Service: 09/06/2018 1:45 PM Medical Record Number: [id number] Patient Account Number: [id number] Date of Birth/Gender: 05/18/1971 (48 y.o. M) Treating RN: Montey Hora Primary Care Physician: Gaetano Net Other Clinician: Referring Physician: Gaetano Net Treating Physician/Extender: Tito Dine in Treatment: 78 Education Assessment Education Provided To: Patient Education Topics Provided Venous: Handouts: Other: please wear your compression wraps Methods: Explain/Verbal Responses: State content correctly Electronic Signature(s) Signed: 09/06/2018 5:48:26 PM By: Montey Hora Entered By: Montey Hora on 09/06/2018 14:56:56 [name], [name] ([id number]) -------------------------------------------------------------------------------- Wound Assessment Details Patient Name: [name], Lathan A. Date of Service: 09/06/2018 1:45 PM Medical Record Number: [id number] Patient Account Number: [id number] Date of Birth/Sex: [birth year] (48 y.o. M) Treating RN: Secundino Ginger Primary Care Jon Lall: Gaetano Net Other Clinician: Referring Quandarius Nill: Gaetano Net Treating Aldyn Toon/Extender: Tito Dine in Treatment: 36 Wound Status Wound Number: 2 Primary Etiology: Diabetic Wound/Ulcer of the Lower Extremity Wound Location: Left Lower Leg - Lateral, Posterior Secondary Lymphedema Wounding Event: Gradually Appeared Etiology: Date Acquired: 09/30/2017 Wound Status: Open Weeks Of Treatment: 36 Comorbid History: Hypertension, Type II Diabetes, Clustered Wound: No Neuropathy Photos Photo Uploaded By: Secundino Ginger on 09/06/2018 14:39:27 Wound Measurements Length: (cm) 1 Width: (cm) 4 Depth: (cm) 0.2 Area: (cm) 3.142 Volume: (cm) 0.628 % Reduction in Area: 88.9% %  Reduction in Volume: 94.4% Epithelialization: None Tunneling: No Undermining: No Wound Description Classification: Grade 2 Wound Margin: Indistinct, nonvisible Exudate Amount: Small Exudate Type: Serous Exudate Color: amber Foul Odor After Cleansing: No Slough/Fibrino Yes Wound Bed Granulation Amount: None Present (0%) Exposed Structure Necrotic Amount: Small (1-33%) Fascia Exposed: No Necrotic Quality: Adherent Slough Fat Layer (Subcutaneous Tissue) Exposed:  Yes Tendon Exposed: No Muscle Exposed: No Joint Exposed: No Bone Exposed: No Periwound Skin Texture Suarez, Ademide A. ([id number]) Texture Color No Abnormalities Noted: No No Abnormalities Noted: No Callus: No Atrophie Blanche: No Crepitus: No Cyanosis: No Excoriation: No Ecchymosis: No Induration: No Erythema: No Rash: No Hemosiderin Staining: No Scarring: No Mottled: No Pallor: No Moisture Rubor: No No Abnormalities Noted: No Dry / Scaly: No Temperature / Pain Maceration: No Temperature: No Abnormality Wound Preparation Ulcer Cleansing: Rinsed/Irrigated with Saline Topical Anesthetic Applied: Other: lidocaine 4%, Treatment Notes Wound #2 (Left, Lateral, Posterior Lower Leg) Notes Silvercell,gauze, 4 layer left and patient's own compression wrap on the right when he gets home Electronic Signature(s) Signed: 09/06/2018 4:13:45 PM By: Secundino Ginger Entered By: Secundino Ginger on 09/06/2018 14:15:49 Yo, Vaughn A. ([id number]) -------------------------------------------------------------------------------- Vitals Details Patient Name: Banes, Jermie A. Date of Service: 09/06/2018 1:45 PM Medical Record Number: [id number] Patient Account Number: [id number] Date of Birth/Sex: 07/08/1971 (48 y.o. M) Treating RN: Montey Hora Primary Care [place] Jarquin: Gaetano Net Other Clinician: Referring Taras Rask: Gaetano Net Treating Karin Pinedo/Extender: Tito Dine in Treatment: 36 Vital Signs Time Taken:  13:42 Temperature (F): 98.2 Height (in): 76 Pulse (bpm): 61 Weight (lbs): 401.6 Respiratory Rate (breaths/min): 18 Body Mass Index (BMI): 48.9 Blood Pressure (mmHg): 164/74 Reference Range: 80 - 120 mg / dl Electronic Signature(s) Signed: 09/06/2018 4:30:10 PM By: Lorine Bears RCP, RRT, CHT Entered By: Lorine Bears on 09/06/2018 13:44:41

## 2018-09-12 ENCOUNTER — Encounter: Payer: Self-pay | Admitting: Anesthesiology

## 2018-09-12 ENCOUNTER — Other Ambulatory Visit: Payer: Self-pay

## 2018-09-12 ENCOUNTER — Encounter: Payer: Self-pay | Admitting: *Deleted

## 2018-09-20 ENCOUNTER — Encounter: Payer: BLUE CROSS/BLUE SHIELD | Admitting: Internal Medicine

## 2018-09-20 DIAGNOSIS — E11622 Type 2 diabetes mellitus with other skin ulcer: Secondary | ICD-10-CM | POA: Diagnosis not present

## 2018-09-22 NOTE — Progress Notes (Signed)
[name] ([id number]) Visit Report for 09/20/2018 HPI Details Patient Name: [name], [name] Date of Service: 09/20/2018 12:30 PM Medical Record Number: [id number] Patient Account Number: [id number] Date of Birth/Sex: 10-31-1970 (48 y.o. M) Treating RN: [name] Primary Care Provider: Gaetano [name] Other Clinician: Referring Provider: Gaetano [name] Treating Provider/Extender: [name] in Treatment: 68 History of Present Illness HPI Description: 12/28/17; this is a now 48 year old man who is a type II diabetic. He was hospitalized from 10/01/17 through 10/19/17. He had an MSSA soft tissue and skin infection. 2 open areas on the left leg were identified he has a smaller area on the left medial calf superiorly just below the knee and a wound just above the left ankle on the posterior medial aspect. I think both of these were surgical IandD sites when he was in the hospital. He was discharged with a wound VAC at that point however this is since been taken off. He follows with Dr. Ola [name] for the [facility] and he is still on chronic Keflex at 500 twice a day. At that time he was hospitalized his hemoglobin A1c was 15.1 however if I'm reading his endocrinologist notes correctly that is improved. He has been following with [name] at vein and vascular and he has been applying calcium alginate and Unna boots. He has home health changing the dressing. They have also been attempting to get him external compression pumps although the patient is unaware whether they've been approved by insurance at this point. as mentioned he has a smaller clean wound on the right lateral calf just below the knee and he has a much larger area just above the left ankle medially and posteriorly. Our intake nurse reported greenish purulent looking drainage.the patient did have surgical material sent to pathology in February. This showed chronic abscess The patient also has lymphedema stage III in the left  greater than right lower extremities. He has a history of blisters with wounds but these of all were always healed. The patient thinks that the lymphedema may have been present since he was about 48 years old i.e. about 30 years. He does not have graded pressure stockings and has not worn stockings. He does not have a distant history of DVT PE or phlebitis. He has not been systemically unwell fever no chills. He states that his Lasix is recently been reduced. He tells me his kidney function is at "30%" and he has been followed by Dr. Candiss [name] of nephrology. At one point he was on Lasix 80 twice a day however that's been cut back and he is now on Lasix at 20 twice a day. The patient has a history of PAD listed in his records although he comes from [name] I don't think is felt to have significant PAD. ABIs in our clinic were noncompressible bilaterally. 01/04/18; patient has a large wound on the left lateral lower calf and a small wound on the left medial upper calf. He has been to see his nephrologist who changed him to Demadex 40 mg a day. I'm hopeful this will help with his systemic fluid overload. He also has stage III lymphedema. Really no change in the 2 wounds since last week 01/11/18; the patient is down 13 pounds. He put his stage III lymphedema left leg in 4K compression last week and there is less edema fluid however we still haven't been able to communicate with home health but apparently it is [facility] but the dressings have not been changed. The  patient noted an odor last week. He is also had compression pumps ordered by [facility] this as a not completed the paperwork stage. 01/18/18; patient continues to lose weight. Stage III lymphedema in the left greater than right leg under for alert compression. The major wound is on the left lateral ankle area. He apparently has bilateral compression pumps being brought to his house, these were ordered by [facility] Notable for the fact today he had some blisters on the right anterior leg together with some skin nodules. This is no doubt secondary to severe lymphedema. 01/25/18; the patient has obtained his compression pumps and is using them per vein and vascular instructions 3 times a day for an hour. He also saw [name] of vein and vascular. He was felt to have venous insufficiency but did not suggest any intervention also improved edema. It was suggested that he have compression stockings 20-30 mm on a daily basis in addition to compression pumps. [name], [name] ([id number]) The patient arrives in clinic today with a layer of unna under for layer compression. he seems to have some trouble with the degree of compression. He has open areas on the left lateral ankle area which is his major wound left upper medial calf and a superficial open area on the right anterior shin area which was blistered last week. He has skin changes on the right anterior calf which I think are no doubt secondary to lymphedema skin nodules etc. 02/01/18; the patient comes in telling us his nephrologist have to his torsemide. Unfortunately today he is put on 7 pounds by our scales. He has blisters all over the anterior and medial part of his right calf and a new open wound. He also has soupy green drainage coming out of the left lateral calf /ankle wound. 02/08/18; culture I did last week of the left lateral ankle wound grew both Pseudomonas and Morganella. He is on Keflex from Dr. Ola [name] in the hospital. I will need to review these notes.in any case Keflex is not going to cover these 2 organisms. I'm probably going to added ciprofloxacin today for 1 week. A lot of drainage that looks purulent last week. He is not complaining of pain however he has managed to put on 10 pounds in 2 weeks by our scales in this clinic. He is going to see his nephrologist tomorrow 02/15/18; he completed the ciprofloxacin I gave him last week.  Notable that he is up to 379 pounds today which is up 16 pounds from 2 weeks ago. He is complaining of orthopnea but doesn't have any chest pain. 02/22/18; he continues to have weight gain. R intake nurse reports again purulent green drainage coming out of the lateral wound on the lateral left calf. He has small open area on the right anterior leg. His torsemide was increased to 2 tablets a day I believe this is 40 mg last week in response to the call admitted to Dr. Keturah Barre office. He follows up with Dr. Candiss [name] and Dr. Delana Meyer tomorrow 03/01/18 his weight essentially stable today at 383 pounds. Drainage out of the left lateral wound on the lower left calf/ankle is a lot less. Culture last time grew Pseudomonas. I put him on cefdinir. He has been to Dr. Keturah Barre office no adjustments in his diuretics. Dr. Nicoletta Dress prescribed a wraparound stocking for the right leg.there is no open area on the right leg. He has a superficial area on the left medial calf, left posterior  calf and in the large area on the left lateral however this looks better 03/15/18; weight is not up to 393 pounds. He saw his nephrologist yesterday Dr. Candiss [name] will increase the Demadex I'm hopeful this will help with the edema control. I'm using silver alginate to all his wounds. In particular the left lateral ankle looks better. oUnfortunately he has new open areas on the right lateral calf that will include use of his compression stocking at least in the short to medium term. He has new wounds o3 on the right lateral calf. One of these has some size however all numerous superficial 03/22/18; his weight is stabilized a bit. Just adjustment of his diuretics by his nephrologist. There is no doubt he has some degree of systemic fluid overload on top of severe left greater than right lymphedema. 2 weeks ago tried to transition him to stockings on the right leg however he developed re-breakdown of skin on the right leg and we had to put him back  in compression last week. He also uses external compression pumps and claims to be compliant Continued concern about his depression today 03/29/18; several ongoing issues with this patient; oHe no longer has home health coverage apparently secondary to a lapse in insurance. He is apparently transitioning from short for long-term disability. [facility] was changing his compression wraps on Monday and Friday. oHe has gained 10 pounds since last week oSees [name] tomorrow oSaw his primary doctor last week about the depression. It sounds as though he declined pharmacologic management. He seems somewhat better today. We were concerned last week when he came. Somewhat better today oHe is using his compression pumps once a day at home, I have asked for twice a day if possible especially on the left leg oFollows up with his nephrologist in mid-August. He is managing his diuretic for I think stage IV chronic renal failure oParadoxically his wounds actually look better 04/19/18; the patient has not been seen since I last saw him 3 weeks ago. He saw Dr. Delana Meyer of vascular surgery on 03/30/18 I believe he put him in a 20/30 stocking bilaterally with a wraparound extremitease stocking. He has not been putting anything specifically on the wound. More problematic than that he has not been wearing the stockings he is at home. He has been using his external compression pumps once per day according to him on a rare occasion twice On a psychosocial level the patient is now on long-term disability and is applying for COBRA therefore he is between insurances. He has not been able to follow up with Dr. Candiss [name] who is his nephrologist as a result. As noted his weight is up to 407 pounds today. He promises me he'll follow-up with Dr. Candiss [name] 05/03/18; he hasn't been here in 2 weeks now. He apparently has been wearing a compression sock on the left leg. Massive increase in edema 3 large open wounds on the left anterior  leg that were probably blisters. Significant deterioration in the left lateral ankle wound that we've been doing as his most problematic wound. He still does not have his insurance issues wrapped up. His weight is well over 400 pounds. 05/10/18; arrives today with better looking edema control in the left leg. He has been using his compression pumps twice a day. We also wrapped it is left leg for there he's been using his pumps so there is much better edema control. Most of new Pompei, Arthuro A. ([id number]) wounds from last week look a  lot better. Even the refractory area on the lower left lateral ankle looks a lot better to me today. He follows up with Dr. Candiss [name] this morning [nephrology] 05/17/18 patient arrives today with a lot less edema in the left leg. His weight is gone down 7 pounds. He tells me he saw Dr. Candiss [name] but his torsemide was not adjusted. He is using the palms 3 times a day. There is been quite an improvement in the remaining wound on the left lateral ankle oWeekly visit for follow-up of bilateral lower extremity wounds related to severe lymphedema and probably some degree of systemic fluid overload from chronic renal failure stage IV. His weight is up this week to over 400 pounds. He noted increasing edema in the right leg late last week. He took his compression stocking off he did not increase the frequency of this compression pump use. He developed a large blister on the back of the right calf. He also has a new opened blister on the left lateral calf in addition to the wound that we've been using on the distal left lower calf area and we've been using silver alginate. He tells me that he has an ultrasound which is a DVT rule out and I think a reflux study ordered by [name] 05/31/18; weekly visit. He went to see vein and vascular this week apparently they remove the 4-layer compression on the left and put an Unna boot on him in replacement. He's got more swelling in the left leg  is resolved. That being said his left lower Wound is better. He still has a fairly large wound on the right posterior calf this was not disturbed. He has a follow-up appointment with Dr. Keturah Barre nephrologist next week 06/07/2018; the patient arrives today with a history that he took both his compression wraps off 3 days ago in order to take a shower. He has bilateral severe tense blisters. A lot of weeping erythema especially on the lateral left calf. Almost circumferential blisters on the right. Multiple areas of epithelial breakdown. He does not complain of any pain fever chills. He states he has been using his compression pump in some form of stocking that I could not really determine the type. He did not come into the clinic with anything on his legs. He tells me he has an appointment with Dr. Merita Norton his nephrologist I believe this Friday. Paradoxically his weight is actually down to 385 pounds I believe last week he was over 400 06/14/18; arrives with better looking wound surfaces today on both legs. There is no further blistering however there are still areas that aren't epithelialized on the right anterior, right lateral, left posterior and left medial. His original wound just above the left ankle laterally is still open moist. His weight was about the same today. He tells me that his nephrologist increase the torsemide from 40/20/09/1939/40 twice a day 06/21/18; both his wraps fell down to his mid calf. Has a result he has multiple blisters across the anterior right leg above with the wraps ultimately watched. He also has blisters on the posterior part of the left calf o2. His original wound on the left lateral calf is hard to see any open area. There is however a divot. Which is going to be difficult to deal with into the future until the edema in the left leg is controlled. 2 small superficial areas remain He tells [place] his weight was 386 pounds on his scale at home. According to our scale he  is up 5 pounds. He is not on a fluid restriction 06/28/18; patient's weight is gone up 2 pounds since last time. He has weeping edema and open superficial wounds on the right posterior right lateral and right anterior calf. He has a small open area on the left anterior probably left posterior calf and the original wound on the left lateral calf appears to be just about closed He is being planned for a dialysis shunt in the right arm through [facility] incoordination with his nephrologist Dr. Candiss [name] He claims to be using his compression pumps twice a day. He has lymphedema and no doubt systemic fluid volume overload from stage IV chronic renal failure 07/04/18; patient's weight is up into the 396 range. He is supposed to see his cardiologist tomorrow and plans are being made for a shunt by Dr. Delana Meyer. He still has significant open wounds/draining areas on the right anterior and right posterior calf. Several small open areas on the left which are less in terms of wound area on the right which is surprising given the fact the left is the larger most lymphedematous leg 07/26/2018 She is seen today for follow-up and management of lateral posterior lower leg wound and bilateral lymphoedema. He has a very flat affect and depressed mood today. Unable to elicit much information from him today. No thoughts of harm to self identified. Recently had a follow-up with vascular vein for fistula placement to initiate dialysis in the right arm. He has 2 dressings on the right arm from the fistula procedure with no bleeding or drainage noted on the dressing. He does have a large amount of bruising in the surrounding to puncture areas on the right lower arm from the fistula placement. No pain elicited with palpation of the right arm. He denies a diminished sensation in that right lower arm as well. He does have a small wound to the left lateral posterior lower leg. No visual drainage present to either leg.  However the wound to the left lower leg does have a foul odor even after cleaning it. The left is the larger most lymphedematous leg with the right Leg still having a significant amount of edema. No drainage or blisters present on either leg today. He states that he is using the lymphedema pumps twice a day. Not too sure if he is compliant with actually using the lymphedema pumps based on the amount of edema that he has in his legs. Weight increased from 396.1 to 401.6. No recent fevers, chills, or shortness of breath. 08/02/18; the patient only has one remaining wound on the left lateral calf which is still open. This is in the resultant [facility]. ([id number]) shaped area which was once the site of the major wound. He does not have any open areas on the right leg nor additional wounds on the left no blistering. As usual the left leg is much larger than the right. He comes in today stating that he took the wraps off on the right leg on Friday and since then he has presumably been wearing his stocking which is a wraparound variant stocking on the right. States he took the 4-layer compression off his left leg this morning to shower. He is seeing vein and vascular tomorrow. He states he is using his compression pumps once or twice a day. He also admits that he has been on his feet a lot 08/09/18; the patient has the left leg healed except for the original wound site on  the left medial Just above the ankle. On the right he has a new wound on the right lateral calf. He saw vein and vascular last week and he is been back in his pressure stockings and wraparound stockings. He also is been put on metolazone 3 times a week by nephrology along with his torsemide. I'm hopeful that this will help with some of the lower extremity edema. I've always felt that he has lymphedema but there may be a secondary component contributing 08/16/18; the patient used his own junk slight stockings to both legs last  week. He claims he is pumping once to twice a day at home. Here rise with his legs looking visibly less edematous. His usual left greater than right secondary to lymphedema. He is tolerating the metolazone 3 times a week along with the torsemide. His weight was down 2 pounds. He follows up with nephrology soon for follow-up lab work I think in early January. He does not have any open areas on the right leg he still has the crevice on the lateral left calf just above the ankle there is 2 small open areas here I am not sure that this is ever going to be free of an open area although certainly not worsening. More problematic lead he has 2 blistered areas just above the wrist on the left lateral calf 09/06/2018; the patient arrives with much larger legs than I remember seeing a month ago especially on the right. He has 1 open area on the left lateral calf and he had a 4 layer compression on this on arrival in the clinic. This was apparently put on by [facility] in response to a new open wound. He claims he is pumping once or more often twice a day although I really have a hard time believing it. He came in with nothing but stockings on his legs. There is tremendous increase in the edema bilaterally 1/22; the patient only has the one area that is not totally closed and that is the divot on the left lateral calf. I do not think we are going to be able to do anything further to this unless he loses edema fluid in his left leg when he starts dialysis which I gather is sometime soon. He is using his own wraparound compression garment. He is using the compression pumps twice a day. Electronic Signature(s) Signed: 09/20/2018 5:53:07 PM By: Linton Ham MD Entered By: Linton Ham on 09/20/2018 13:34:26 [name], [name] AMarland Kitchen ([id number]) -------------------------------------------------------------------------------- Physical Exam Details Patient Name: [name], [name] Date of Service: 09/20/2018 12:30  PM Medical Record Number: [id number] Patient Account Number: [id number] Date of Birth/Sex: [birth year] (48 y.o. M) Treating RN: [name] Primary Care Provider: Gaetano [name] Other Clinician: Referring Provider: Gaetano [name] Treating Provider/Extender: [name] in Treatment: 51 Constitutional Patient is hypertensive.. Pulse regular and within target range for patient.[name] Respirations regular, non-labored and within target range.. Temperature is normal and within the target range for the patient.. Patient is put on 7 pounds by our record. appears in no distress. Eyes Conjunctivae clear. No discharge. Respiratory Respiratory effort is easy and symmetric bilaterally. Rate is normal at rest and on room air.. Cardiovascular Palpable even through the lymphedema. Lymphatic None palpable in the popliteal or inguinal area. Integumentary (Hair, Skin) Nothing other than the lymphedema left greater than right. Psychiatric No evidence of depression, anxiety, or agitation. Calm, cooperative, and communicative. Appropriate interactions and affect.. Notes Wound exam oHe has no open area on his except for the  crevice in the left lateral calf just above the ankle. The base of this is still open. There is a lot of malodorous material in this area but only small open wounds are visible. There is no evidence of infection Electronic Signature(s) Signed: 09/20/2018 5:53:07 PM By: Linton Ham MD Entered By: Linton Ham on 09/20/2018 13:36:47 [name], [name] ([id number]) -------------------------------------------------------------------------------- Physician Orders Details Patient Name: [name], [name] Date of Service: 09/20/2018 12:30 PM Medical Record Number: [id number] Patient Account Number: [id number] Date of Birth/Sex: 01-06-1971 (48 y.o. M) Treating RN: [name] Primary Care Provider: Gaetano [name] Other Clinician: Referring Provider: Gaetano [name] Treating  Provider/Extender: [name] in Treatment: 68 Verbal / Phone Orders: No Diagnosis Coding Wound Cleansing Wound #2 Left,Lateral,Posterior Lower Leg o Clean wound with Normal Saline. o Cleanse wound with mild soap and water Anesthetic (add to Medication List) Wound #2 Left,Lateral,Posterior Lower Leg o Topical Lidocaine 4% cream applied to wound bed prior to debridement (In Clinic Only). Primary Wound Dressing Wound #2 Left,Lateral,Posterior Lower Leg o Silver Alginate - on all draining areas Secondary Dressing Wound #2 Left,Lateral,Posterior Lower Leg o ABD and Kerlix/Conform Dressing Change Frequency Wound #2 Left,Lateral,Posterior Lower Leg o Change Dressing Monday, Wednesday, Friday Follow-up Appointments Wound #2 Left,Lateral,Posterior Lower Leg o Return Appointment in 1 month Edema Control Wound #2 Left,Lateral,Posterior Lower Leg o Patient to wear own compression stockings - bilaterally o Compression Pump: Use compression pump on left lower extremity for 30 minutes, twice daily. - 1 Hour three times daily o Compression Pump: Use compression pump on right lower extremity for 30 minutes, twice daily. - 1 Hour three times daily Home Health Wound #2 Left,Lateral,Posterior Lower Leg o [place] Visits - [facility]: once weekly o Home Health Nurse may visit PRN to address patientos wound care needs. o FACE TO FACE ENCOUNTER: MEDICARE and MEDICAID PATIENTS: I certify that this patient is under my care and that I had a face-to-face encounter that meets the physician face-to-face encounter requirements with this patient on this date. The encounter with the patient was in whole or in part for the following MEDICAL CONDITION: (primary reason for [place]) MEDICAL NECESSITY: I certify, that based on my findings, NURSING services are a medically necessary home health service. HOME BOUND STATUS: I certify that my Belli, Shemar A.  ([id number]) clinical findings support that this patient is homebound (i.e., Due to illness or injury, pt requires aid of supportive devices such as crutches, cane, wheelchairs, walkers, the use of special transportation or the assistance of another person to leave their place of residence. There is a normal inability to leave the home and doing so requires considerable and taxing effort. Other absences are for medical reasons / religious services and are infrequent or of short duration when for other reasons). o If current dressing causes regression in wound condition, may D/C ordered dressing product/s and apply Normal Saline Moist Dressing daily until next [place] / Other MD appointment. [place] of regression in wound condition at [phone]. o Please direct any NON-WOUND related issues/requests for orders to patient's Primary Care Physician Electronic Signature(s) Signed: 09/20/2018 5:53:07 PM By: Linton Ham MD Signed: 09/21/2018 5:43:44 PM By: Gretta Cool, BSN, RN, CWS, Kim RN, BSN Entered By: Gretta Cool, BSN, RN, CWS, Kim on 09/20/2018 13:11:45 [name], [name] AMarland Kitchen ([id number]) -------------------------------------------------------------------------------- Problem List Details Patient Name: Hogrefe, Bradden A. Date of Service: 09/20/2018 12:30 PM Medical Record Number: [id number] Patient Account Number: [id number] Date of Birth/Sex: [birth year] (49 y.o. 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 ([id number]) -------------------------------------------------------------------------------- Progress Note Details Patient Name: [name], [name] Date of Service: 09/20/2018 12:30 PM Medical Record Number: [id number] Patient Account Number: [id number] Date of Birth/Sex: 1970/09/15 (48 y.o. M) Treating RN: [name] Primary Care Provider: Gaetano [name] Other Clinician: Referring Provider: Gaetano [name] Treating Provider/Extender: [name] in Treatment: 65 Subjective History of Present Illness (HPI) 12/28/17; this is a now 48 year old man who is a type II diabetic. He was hospitalized from 10/01/17 through 10/19/17. He had an MSSA soft tissue and skin infection. 2 open areas on the left leg were identified he has a smaller area on the left medial calf superiorly just below the knee and a wound just above the left ankle on the posterior medial aspect. I think both of these were surgical IandD sites when he was in the hospital. He was discharged with a wound VAC at that point however this is since been taken off. He follows with Dr. Ola [name] for [name] and he is still on chronic Keflex at 500 twice a day. At that time he was hospitalized his hemoglobin A1c was 15.1 however if I'm reading his endocrinologist notes correctly that is improved. He has been following with [name] at vein and vascular and he has been applying calcium alginate and Unna boots. He has home health changing the dressing. They have also been attempting to get him external compression pumps although the patient is unaware whether they've been  approved by insurance at this point. as mentioned he has a smaller clean wound on the right lateral calf just below the knee and he has a much larger area just above the left ankle medially and posteriorly. Our intake nurse reported greenish purulent looking drainage.the patient did have surgical material sent to pathology in February. This showed chronic abscess The patient also has lymphedema stage III in the left greater than right lower extremities. He has a history of blisters with wounds but these of all were always healed. The patient thinks that the lymphedema may have been present since he was about 48 years old i.e. about 30 years. He does not have graded pressure stockings and has not worn stockings. He does not have a distant history of DVT PE or phlebitis. He has not been systemically unwell fever no chills. He states that his Lasix is recently been reduced. He tells me his kidney function is at "30%" and he has been followed by Dr. Candiss [name] of nephrology. At one point he was on Lasix 80 twice a day however that's been cut back and he is now on Lasix at 20 twice a day. The patient has a history of PAD listed in his records although he comes from [name] I don't think is felt to have significant PAD. ABIs  in our clinic were noncompressible bilaterally. 01/04/18; patient has a large wound on the left lateral lower calf and a small wound on the left medial upper calf. He has been to see his nephrologist who changed him to Demadex 40 mg a day. I'm hopeful this will help with his systemic fluid overload. He also has stage III lymphedema. Really no change in the 2 wounds since last week 01/11/18; the patient is down 13 pounds. He put his stage III lymphedema left leg in 4K compression last week and there is less edema fluid however we still haven't been able to communicate with home health but apparently it is [facility] but the dressings have not been changed. The patient noted an odor last  week. He is also had compression pumps ordered by [facility] this as a not completed the paperwork stage. 01/18/18; patient continues to lose weight. Stage III lymphedema in the left greater than right leg under for alert compression. The major wound is on the left lateral ankle area. He apparently has bilateral compression pumps being brought to his house, these were ordered by  vein and vascular Notable for the fact today he had some blisters on the right anterior leg together with some skin nodules. This is no doubt secondary to severe lymphedema. 01/25/18; the patient has obtained his compression pumps and is using them per vein and vascular instructions 3 times a day for an hour. He also saw [name] of vein and vascular. He was felt to have venous insufficiency but did not suggest any intervention also improved edema. It was suggested that he have compression stockings 20-30 mm on a daily basis in addition to compression pumps. The patient arrives in clinic today with a layer of unna under for layer compression. he seems to have some trouble with the degree of compression. He has open areas on the left lateral ankle area which is his major wound left upper medial calf and a Riester, [name] ([id number]) superficial open area on the right anterior shin area which was blistered last week. He has skin changes on the right anterior calf which I think are no doubt secondary to lymphedema skin nodules etc. 02/01/18; the patient comes in telling us his nephrologist have to his torsemide. Unfortunately today he is put on 7 pounds by our scales. He has blisters all over the anterior and medial part of his right calf and a new open wound. He also has soupy green drainage coming out of the left lateral calf /ankle wound. 02/08/18; culture I did last week of the left lateral ankle wound grew both Pseudomonas and Morganella. He is on Keflex from Dr. Ola [name] in the hospital. I will  need to review these notes.in any case Keflex is not going to cover these 2 organisms. I'm probably going to added ciprofloxacin today for 1 week. A lot of drainage that looks purulent last week. He is not complaining of pain however he has managed to put on 10 pounds in 2 weeks by our scales in this clinic. He is going to see his nephrologist tomorrow 02/15/18; he completed the ciprofloxacin I gave him last week. Notable that he is up to 379 pounds today which is up 16 pounds from 2 weeks ago. He is complaining of orthopnea but doesn't have any chest pain. 02/22/18; he continues to have weight gain. R intake nurse reports again purulent green drainage coming out of the lateral wound on the lateral left calf. He has small open  area on the right anterior leg. His torsemide was increased to 2 tablets a day I believe this is 40 mg last week in response to the call admitted to Dr. Keturah Barre office. He follows up with Dr. Candiss [name] and Dr. Delana Meyer tomorrow 03/01/18 his weight essentially stable today at 383 pounds. Drainage out of the left lateral wound on the lower left calf/ankle is a lot less. Culture last time grew Pseudomonas. I put him on cefdinir. He has been to Dr. Keturah Barre office no adjustments in his diuretics. Dr. Nicoletta Dress prescribed a wraparound stocking for the right leg.there is no open area on the right leg. He has a superficial area on the left medial calf, left posterior calf and in the large area on the left lateral however this looks better 03/15/18; weight is not up to 393 pounds. He saw his nephrologist yesterday Dr. Candiss [name] will increase the Demadex I'm hopeful this will help with the edema control. I'm using silver alginate to all his wounds. In particular the left lateral ankle looks better. Unfortunately he has new open areas on the right lateral calf that will include use of his compression stocking at least in the short to medium term. He has new wounds o3 on the right lateral calf. One of  these has some size however all numerous superficial 03/22/18; his weight is stabilized a bit. Just adjustment of his diuretics by his nephrologist. There is no doubt he has some degree of systemic fluid overload on top of severe left greater than right lymphedema. 2 weeks ago tried to transition him to stockings on the right leg however he developed re-breakdown of skin on the right leg and we had to put him back in compression last week. He also uses external compression pumps and claims to be compliant Continued concern about his depression today 03/29/18; several ongoing issues with this patient; He no longer has home health coverage apparently secondary to a lapse in insurance. He is apparently transitioning from short for long-term disability. [facility] was changing his compression wraps on Monday and Friday. He has gained 10 pounds since last week Sees [name] tomorrow Saw his primary doctor last week about the depression. It sounds as though he declined pharmacologic management. He seems somewhat better today. We were concerned last week when he came. Somewhat better today He is using his compression pumps once a day at home, I have asked for twice a day if possible especially on the left leg Follows up with his nephrologist in mid-August. He is managing his diuretic for I think stage IV chronic renal failure Paradoxically his wounds actually look better 04/19/18; the patient has not been seen since I last saw him 3 weeks ago. He saw Dr. Delana Meyer of vascular surgery on 03/30/18 I believe he put him in a 20/30 stocking bilaterally with a wraparound extremitease stocking. He has not been putting anything specifically on the wound. More problematic than that he has not been wearing the stockings he is at home. He has been using his external compression pumps once per day according to him on a rare occasion twice On a psychosocial level the patient is now on long-term disability and is  applying for COBRA therefore he is between insurances. He has not been able to follow up with Dr. Candiss [name] who is his nephrologist as a result. As noted his weight is up to 407 pounds today. He promises me he'll follow-up with Dr. Candiss [name] 05/03/18; he hasn't been here in 2 weeks  now. He apparently has been wearing a compression sock on the left leg. Massive increase in edema 3 large open wounds on the left anterior leg that were probably blisters. Significant deterioration in the left lateral ankle wound that we've been doing as his most problematic wound. He still does not have his insurance issues wrapped up. His weight is well over 400 pounds. 05/10/18; arrives today with better looking edema control in the left leg. He has been using his compression pumps twice a day. We also wrapped it is left leg for there he's been using his pumps so there is much better edema control. Most of new wounds from last week look a lot better. Even the refractory area on the lower left lateral ankle looks a lot better to me today. He follows up with Dr. Candiss [name] this morning [nephrology] 05/17/18 patient arrives today with a lot less edema in the left leg. His weight is gone down 7 pounds. He tells me he saw Dr. Helene Kelp, Acea A. ([id number]) [name] but his torsemide was not adjusted. He is using the palms 3 times a day. There is been quite an improvement in the remaining wound on the left lateral ankle Weekly visit for follow-up of bilateral lower extremity wounds related to severe lymphedema and probably some degree of systemic fluid overload from chronic renal failure stage IV. His weight is up this week to over 400 pounds. He noted increasing edema in the right leg late last week. He took his compression stocking off he did not increase the frequency of this compression pump use. He developed a large blister on the back of the right calf. He also has a new opened blister on the left lateral calf in addition to the wound that  we've been using on the distal left lower calf area and we've been using silver alginate. He tells me that he has an ultrasound which is a DVT rule out and I think a reflux study ordered by [name] 05/31/18; weekly visit. He went to see vein and vascular this week apparently they remove the 4-layer compression on the left and put an Unna boot on him in replacement. He's got more swelling in the left leg is resolved. That being said his left lower Wound is better. He still has a fairly large wound on the right posterior calf this was not disturbed. He has a follow-up appointment with Dr. Keturah Barre nephrologist next week 06/07/2018; the patient arrives today with a history that he took both his compression wraps off 3 days ago in order to take a shower. He has bilateral severe tense blisters. A lot of weeping erythema especially on the lateral left calf. Almost circumferential blisters on the right. Multiple areas of epithelial breakdown. He does not complain of any pain fever chills. He states he has been using his compression pump in some form of stocking that I could not really determine the type. He did not come into the clinic with anything on his legs. He tells me he has an appointment with Dr. Merita Norton his nephrologist I believe this Friday. Paradoxically his weight is actually down to 385 pounds I believe last week he was over 400 06/14/18; arrives with better looking wound surfaces today on both legs. There is no further blistering however there are still areas that aren't epithelialized on the right anterior, right lateral, left posterior and left medial. His original wound just above the left ankle laterally is still open moist. His weight was about the same  today. He tells me that his nephrologist increase the torsemide from 40/20/09/1939/40 twice a day 06/21/18; both his wraps fell down to his mid calf. Has a result he has multiple blisters across the anterior right leg above with the wraps  ultimately watched. He also has blisters on the posterior part of the left calf o2. His original wound on the left lateral calf is hard to see any open area. There is however a divot. Which is going to be difficult to deal with into the future until the edema in the left leg is controlled. 2 small superficial areas remain He tells [place] his weight was 386 pounds on his scale at home. According to our scale he is up 5 pounds. He is not on a fluid restriction 06/28/18; patient's weight is gone up 2 pounds since last time. He has weeping edema and open superficial wounds on the right posterior right lateral and right anterior calf. He has a small open area on the left anterior probably left posterior calf and the original wound on the left lateral calf appears to be just about closed He is being planned for a dialysis shunt in the right arm through [facility] incoordination with his nephrologist Dr. Candiss [name] He claims to be using his compression pumps twice a day. He has lymphedema and no doubt systemic fluid volume overload from stage IV chronic renal failure 07/04/18; patient's weight is up into the 396 range. He is supposed to see his cardiologist tomorrow and plans are being made for a shunt by Dr. Delana Meyer. He still has significant open wounds/draining areas on the right anterior and right posterior calf. Several small open areas on the left which are less in terms of wound area on the right which is surprising given the fact the left is the larger most lymphedematous leg 07/26/2018 She is seen today for follow-up and management of lateral posterior lower leg wound and bilateral lymphoedema. He has a very flat affect and depressed mood today. Unable to elicit much information from him today. No thoughts of harm to self identified. Recently had a follow-up with vascular vein for fistula placement to initiate dialysis in the right arm. He has 2 dressings on the right arm from the fistula  procedure with no bleeding or drainage noted on the dressing. He does have a large amount of bruising in the surrounding to puncture areas on the right lower arm from the fistula placement. No pain elicited with palpation of the right arm. He denies a diminished sensation in that right lower arm as well. He does have a small wound to the left lateral posterior lower leg. No visual drainage present to either leg. However the wound to the left lower leg does have a foul odor even after cleaning it. The left is the larger most lymphedematous leg with the right Leg still having a significant amount of edema. No drainage or blisters present on either leg today. He states that he is using the lymphedema pumps twice a day. Not too sure if he is compliant with actually using the lymphedema pumps based on the amount of edema that he has in his legs. Weight increased from 396.1 to 401.6. No recent fevers, chills, or shortness of breath. 08/02/18; the patient only has one remaining wound on the left lateral calf which is still open. This is in the resultant [place] shaped area which was once the site of the major wound. He does not have any open areas on the right  leg nor additional wounds on the left no blistering. As usual the left leg is much larger than the right. [name], [name] ([id number]) He comes in today stating that he took the wraps off on the right leg on Friday and since then he has presumably been wearing his stocking which is a wraparound variant stocking on the right. States he took the 4-layer compression off his left leg this morning to shower. He is seeing vein and vascular tomorrow. He states he is using his compression pumps once or twice a day. He also admits that he has been on his feet a lot 08/09/18; the patient has the left leg healed except for the original wound site on the left medial Just above the ankle. On the right he has a new wound on the right lateral calf. He saw vein and  vascular last week and he is been back in his pressure stockings and wraparound stockings. He also is been put on metolazone 3 times a week by nephrology along with his torsemide. I'm hopeful that this will help with some of the lower extremity edema. I've always felt that he has lymphedema but there may be a secondary component contributing 08/16/18; the patient used his own junk slight stockings to both legs last week. He claims he is pumping once to twice a day at home. Here rise with his legs looking visibly less edematous. His usual left greater than right secondary to lymphedema. He is tolerating the metolazone 3 times a week along with the torsemide. His weight was down 2 pounds. He follows up with nephrology soon for follow-up lab work I think in early January. He does not have any open areas on the right leg he still has the crevice on the lateral left calf just above the ankle there is 2 small open areas here I am not sure that this is ever going to be free of an open area although certainly not worsening. More problematic lead he has 2 blistered areas just above the wrist on the left lateral calf 09/06/2018; the patient arrives with much larger legs than I remember seeing a month ago especially on the right. He has 1 open area on the left lateral calf and he had a 4 layer compression on this on arrival in the clinic. This was apparently put on by [facility] in response to a new open wound. He claims he is pumping once or more often twice a day although I really have a hard time believing it. He came in with nothing but stockings on his legs. There is tremendous increase in the edema bilaterally 1/22; the patient only has the one area that is not totally closed and that is the divot on the left lateral calf. I do not think we are going to be able to do anything further to this unless he loses edema fluid in his left leg when he starts dialysis which I gather is sometime soon. He is using his  own wraparound compression garment. He is using the compression pumps twice a day. Objective Constitutional Patient is hypertensive.. Pulse regular and within target range for patient.[name] Respirations regular, non-labored and within target range.. Temperature is normal and within the target range for the patient.. Patient is put on 7 pounds by our record. appears in no distress. Vitals Time Taken: 12:40 PM, Height: 76 in, Weight: 401.6 lbs, BMI: 48.9, Temperature: 97.9 F, Pulse: 64 bpm, Respiratory Rate: 18 breaths/min, Blood Pressure: 184/90 mmHg. Eyes Conjunctivae clear. No  discharge. Respiratory Respiratory effort is easy and symmetric bilaterally. Rate is normal at rest and on room air.. Cardiovascular Palpable even through the lymphedema. Lymphatic None palpable in the popliteal or inguinal area. Psychiatric No evidence of depression, anxiety, or agitation. Calm, cooperative, and communicative. Appropriate interactions and affect.[name] [name], [name] ([id number]) General Notes: Wound exam He has no open area on his except for the crevice in the left lateral calf just above the ankle. The base of this is still open. There is a lot of malodorous material in this area but only small open wounds are visible. There is no evidence of infection Integumentary (Hair, Skin) Nothing other than the lymphedema left greater than right. Wound #2 status is Open. Original cause of wound was Gradually Appeared. The wound is located on the Left,Lateral,Posterior Lower Leg. The wound measures 0.1cm length x 0.1cm width x 0.1cm depth; 0.008cm^2 area and 0.001cm^3 volume. There is Fat Layer (Subcutaneous Tissue) Exposed exposed. There is no tunneling or undermining noted. There is a small amount of serous drainage noted. The wound margin is indistinct and nonvisible. There is no granulation within the wound bed. There is a small (1-33%) amount of necrotic tissue within the wound bed including Adherent  Slough. The periwound skin appearance did not exhibit: Callus, Crepitus, Excoriation, Induration, Rash, Scarring, Dry/Scaly, Maceration, Atrophie Blanche, Cyanosis, Ecchymosis, Hemosiderin Staining, Mottled, Pallor, Rubor, Erythema. Periwound temperature was noted as No Abnormality. Assessment Active Problems ICD-10 Non-pressure chronic ulcer of left calf with necrosis of muscle Lymphedema, not elsewhere classified Chronic venous hypertension (idiopathic) with ulcer and inflammation of left lower extremity Type 2 diabetes mellitus with other skin ulcer Non-pressure chronic ulcer of right calf limited to breakdown of skin Plan Wound Cleansing: Wound #2 Left,Lateral,Posterior Lower Leg: Clean wound with Normal Saline. Cleanse wound with mild soap and water Anesthetic (add to Medication List): Wound #2 Left,Lateral,Posterior Lower Leg: Topical Lidocaine 4% cream applied to wound bed prior to debridement (In Clinic Only). Primary Wound Dressing: Wound #2 Left,Lateral,Posterior Lower Leg: Silver Alginate - on all draining areas Secondary Dressing: Wound #2 Left,Lateral,Posterior Lower Leg: ABD and Kerlix/Conform Dressing Change Frequency: Wound #2 Left,Lateral,Posterior Lower Leg: Change Dressing Monday, Wednesday, Friday Follow-up Appointments: Wound #2 Left,Lateral,Posterior Lower Leg: Return Appointment in 1 month [name], [name] ([id number]) Edema Control: Wound #2 Left,Lateral,Posterior Lower Leg: Patient to wear own compression stockings - bilaterally Compression Pump: Use compression pump on left lower extremity for 30 minutes, twice daily. - 1 Hour three times daily Compression Pump: Use compression pump on right lower extremity for 30 minutes, twice daily. - 1 Hour three times daily Home Health: Wound #2 Left,Lateral,Posterior Lower Leg: Continue Home Health Visits - [facility]: once weekly Home Health Nurse may visit PRN to address patient s wound care needs. FACE TO FACE  ENCOUNTER: MEDICARE and MEDICAID PATIENTS: I certify that this patient is under my care and that I had a face-to-face encounter that meets the physician face-to-face encounter requirements with this patient on this date. The encounter with the patient was in whole or in part for the following MEDICAL CONDITION: (primary reason for [place]) MEDICAL NECESSITY: I certify, that based on my findings, NURSING services are a medically necessary home health service. HOME BOUND STATUS: I certify that my clinical findings support that this patient is homebound (i.e., Due to illness or injury, pt requires aid of supportive devices such as crutches, cane, wheelchairs, walkers, the use of special transportation or the assistance of another person to leave their place of  residence. There is a normal inability to leave the home and doing so requires considerable and taxing effort. Other absences are for medical reasons / religious services and are infrequent or of short duration when for other reasons). If current dressing causes regression in wound condition, may D/C ordered dressing product/s and apply Normal Saline Moist Dressing daily until next [place] / Other MD appointment. [place] of regression in wound condition at [phone]. Please direct any NON-WOUND related issues/requests for orders to patient's Primary Care Physician 1. I do not think that the patient really needs any further wrapping on the left leg. 2. We can move to his own compression garment/external wraparound compression garment. He is using his compression pumps at home twice a day 3. With regards to the area on the left lateral leg I would wash this out daily and use silver alginate. If he runs out of silver alginate dry gauze. 4. We will see how much edema he loses in the left leg when he starts dialysis that may give [place] better access to this area. This was the original large deep open area on  the left leg Electronic Signature(s) Signed: 09/20/2018 5:53:07 PM By: Linton Ham MD Entered By: Linton Ham on 09/20/2018 13:39:42 Syfert, Mazin A. ([id number]) -------------------------------------------------------------------------------- SuperBill Details Patient Name: Iman, [name] Date of Service: 09/20/2018 Medical Record Number: [id number] Patient Account Number: [id number] Date of Birth/Sex: 1970/11/16 (48 y.o. M) Treating RN: [name] Primary Care Provider: Gaetano [name] Other Clinician: Referring Provider: Gaetano [name] Treating Provider/Extender: [name] in Treatment: 38 Diagnosis Coding ICD-10 Codes Code Description [phone] Non-pressure chronic ulcer of left calf with necrosis of muscle I89.0 Lymphedema, not elsewhere classified I87.332 Chronic venous hypertension (idiopathic) with ulcer and inflammation of left lower extremity E11.622 Type 2 diabetes mellitus with other skin ulcer L97.211 Non-pressure chronic ulcer of right calf limited to breakdown of skin Facility Procedures CPT4 Code: 63785885 Description: [phone] - WOUND CARE VISIT-LEV 2 EST PT Modifier: Quantity: 1 Physician Procedures CPT4 Code: 1287867 Description: 67209 - WC PHYS LEVEL 3 - EST PT ICD-10 Diagnosis Description L97.223 Non-pressure chronic ulcer of left calf with necrosis of mu I89.0 Lymphedema, not elsewhere classified L97.211 Non-pressure chronic ulcer of right calf limited to breakdo Modifier: scle wn of skin Quantity: 1 Electronic Signature(s) Signed: 09/20/2018 5:53:07 PM By: Linton Ham MD Entered By: Linton Ham on 09/20/2018 13:39:59

## 2018-09-22 NOTE — Progress Notes (Signed)
[name], [name] ([id number]) Visit Report for 09/20/2018 Arrival Information Details Patient Name: [name], [name] Date of Service: 09/20/2018 12:30 PM Medical Record Number: [id number] Patient Account Number: [id number] Date of Birth/Sex: 1971/02/01 (48 y.o. M) Treating RN: Secundino Ginger Primary Care Kris Burd: Gaetano Net Other Clinician: Referring Uel Davidow: Gaetano Net Treating Mysty Kielty/Extender: Tito Dine in Treatment: 8 Visit Information History Since Last Visit Added or deleted any medications: No Patient Arrived: Ambulatory Any new allergies or adverse reactions: No Arrival Time: 12:39 Had a fall or experienced change in No Accompanied By: self activities of daily living that may affect Transfer Assistance: None risk of falls: Patient Identification Verified: Yes Signs or symptoms of abuse/neglect since last visito No Secondary Verification Process Yes Hospitalized since last visit: No Completed: Implantable device outside of the clinic excluding No Patient Requires Transmission-Based No cellular tissue based products placed in the center Precautions: since last visit: Patient Has Alerts: Yes Has Dressing in Place as Prescribed: Yes Patient Alerts: DM II Pain Present Now: No noncompressible bilateral Electronic Signature(s) Signed: 09/20/2018 4:29:26 PM By: Secundino Ginger Entered By: Secundino Ginger on 09/20/2018 12:39:38 [name], [name] ([id number]) -------------------------------------------------------------------------------- Clinic Level of Care Assessment Details Patient Name: [name], Jolly A. Date of Service: 09/20/2018 12:30 PM Medical Record Number: [id number] Patient Account Number: [id number] Date of Birth/Sex: 07/04/1971 (48 y.o. M) Treating RN: Cornell Barman Primary Care Aixa Corsello: Gaetano Net Other Clinician: Referring Kadden Osterhout: Gaetano Net Treating Lylianna Fraiser/Extender: Tito Dine in Treatment: 64 Clinic Level of Care Assessment Items TOOL  4 Quantity Score []  - Use when only an EandM is performed on FOLLOW-UP visit 0 ASSESSMENTS - Nursing Assessment / Reassessment []  - Reassessment of Co-morbidities (includes updates in patient status) 0 X- 1 5 Reassessment of Adherence to Treatment Plan ASSESSMENTS - Wound and Skin Assessment / Reassessment X - Simple Wound Assessment / Reassessment - one wound 1 5 []  - 0 Complex Wound Assessment / Reassessment - multiple wounds []  - 0 Dermatologic / Skin Assessment (not related to wound area) ASSESSMENTS - Focused Assessment []  - Circumferential Edema Measurements - multi extremities 0 []  - 0 Nutritional Assessment / Counseling / Intervention []  - 0 Lower Extremity Assessment (monofilament, tuning fork, pulses) []  - 0 Peripheral Arterial Disease Assessment (using hand held doppler) ASSESSMENTS - Ostomy and/or Continence Assessment and Care []  - Incontinence Assessment and Management 0 []  - 0 Ostomy Care Assessment and Management (repouching, etc.) PROCESS - Coordination of Care X - Simple Patient / Family Education for ongoing care 1 15 []  - 0 Complex (extensive) Patient / Family Education for ongoing care []  - 0 Staff obtains Programmer, systems, Records, Test Results / Process Orders []  - 0 Staff telephones HHA, Nursing Homes / Clarify orders / etc []  - 0 Routine Transfer to another Facility (non-emergent condition) []  - 0 Routine Hospital Admission (non-emergent condition) []  - 0 New Admissions / Biomedical engineer / Ordering NPWT, Apligraf, etc. []  - 0 Emergency Hospital Admission (emergent condition) X- 1 10 Simple Discharge Coordination [name], [name] ([id number]) []  - 0 Complex (extensive) Discharge Coordination PROCESS - Special Needs []  - Pediatric / Minor Patient Management 0 []  - 0 Isolation Patient Management []  - 0 Hearing / Language / Visual special needs []  - 0 Assessment of Community assistance (transportation, D/C planning, etc.) []  - 0 Additional  assistance / Altered mentation []  - 0 Support Surface(s) Assessment (bed, cushion, seat, etc.) INTERVENTIONS - Wound Cleansing / Measurement X - Simple Wound Cleansing - one wound 1 5 []  -  0 Complex Wound Cleansing - multiple wounds X- 1 5 Wound Imaging (photographs - any number of wounds) []  - 0 Wound Tracing (instead of photographs) X- 1 5 Simple Wound Measurement - one wound []  - 0 Complex Wound Measurement - multiple wounds INTERVENTIONS - Wound Dressings []  - Small Wound Dressing one or multiple wounds 0 X- 1 15 Medium Wound Dressing one or multiple wounds []  - 0 Large Wound Dressing one or multiple wounds []  - 0 Application of Medications - topical []  - 0 Application of Medications - injection INTERVENTIONS - Miscellaneous []  - External ear exam 0 []  - 0 Specimen Collection (cultures, biopsies, blood, body fluids, etc.) []  - 0 Specimen(s) / Culture(s) sent or taken to Lab for analysis []  - 0 Patient Transfer (multiple staff / Civil Service fast streamer / Similar devices) []  - 0 Simple Staple / Suture removal (25 or less) []  - 0 Complex Staple / Suture removal (26 or more) []  - 0 Hypo / Hyperglycemic Management (close monitor of Blood Glucose) []  - 0 Ankle / Brachial Index (ABI) - do not check if billed separately X- 1 5 Vital Signs [name], [name] ([id number]) Has the patient been seen at the hospital within the last three years: Yes Total Score: 70 Level Of Care: New/Established - Level 2 Electronic Signature(s) Signed: 09/21/2018 5:43:44 PM By: Gretta Cool, BSN, RN, CWS, Kim RN, BSN Entered By: Gretta Cool, BSN, RN, CWS, Kim on 09/20/2018 13:12:49 Capito, Amaru AMarland Kitchen ([id number]) -------------------------------------------------------------------------------- Encounter Discharge Information Details Patient Name: [name], Chad A. Date of Service: 09/20/2018 12:30 PM Medical Record Number: [id number] Patient Account Number: [id number] Date of Birth/Sex: 04-Sep-1970 (48 y.o. M) Treating RN:  Montey Hora Primary Care Levetta Bognar: Gaetano Net Other Clinician: Referring Ilyanna Baillargeon: Gaetano Net Treating Sherl Yzaguirre/Extender: Tito Dine in Treatment: 30 Encounter Discharge Information Items Discharge Condition: Stable Ambulatory Status: Ambulatory Discharge Destination: Home Transportation: Private Auto Accompanied By: self Schedule Follow-up Appointment: Yes Clinical Summary of Care: Electronic Signature(s) Signed: 09/20/2018 3:20:27 PM By: Montey Hora Entered By: Montey Hora on 09/20/2018 15:20:27 Sharber, Teofil A. ([id number]) -------------------------------------------------------------------------------- Lower Extremity Assessment Details Patient Name: Whatley, Ponciano A. Date of Service: 09/20/2018 12:30 PM Medical Record Number: [id number] Patient Account Number: [id number] Date of Birth/Sex: February 12, 1971 (49 y.o. M) Treating RN: Secundino Ginger Primary Care Shontia Gillooly: Gaetano Net Other Clinician: Referring Javonn Gauger: Gaetano Net Treating Jenisse Vullo/Extender: Tito Dine in Treatment: 38 Edema Assessment Assessed: [Left: No] [Right: No] Edema: [Left: Ye] [Right: s] Calf Left: Right: Point of Measurement: 36 cm From Medial Instep 65 cm cm Ankle Left: Right: Point of Measurement: 14 cm From Medial Instep 51 cm cm Vascular Assessment Claudication: Claudication Assessment [Left:None] Pulses: Posterior Tibial Extremity colors, hair growth, and conditions: Extremity Color: [Left:Normal] Hair Growth on Extremity: [Left:No] Temperature of Extremity: [Left:Warm] Capillary Refill: [Left:< 3 seconds] Toe Nail Assessment Left: Right: Thick: No Discolored: Yes Deformed: Yes Improper Length and Hygiene: Yes Electronic Signature(s) Signed: 09/20/2018 4:29:26 PM By: Secundino Ginger Entered By: Secundino Ginger on 09/20/2018 12:58:44 Murillo, Plummer A. ([id number]) -------------------------------------------------------------------------------- Multi Wound Chart  Details Patient Name: Deliz, Adilson A. Date of Service: 09/20/2018 12:30 PM Medical Record Number: [id number] Patient Account Number: [id number] Date of Birth/Sex: November 01, 1970 (48 y.o. M) Treating RN: Cornell Barman Primary Care Jahliyah Trice: Gaetano Net Other Clinician: Referring Chessie Neuharth: Gaetano Net Treating Brent Taillon/Extender: Tito Dine in Treatment: 38 Vital Signs Height(in): 76 Pulse(bpm): 64 Weight(lbs): 401.6 Blood Pressure(mmHg): 184/90 Body Mass Index(BMI): 49 Temperature(F): 97.9 Respiratory Rate 18 (breaths/min): Photos: [2:No Photos] [N/A:N/A] Wound Location: [2:Left  Lower Leg - Lateral, Posterior] [N/A:N/A] Wounding Event: [2:Gradually Appeared] [N/A:N/A] Primary Etiology: [2:Diabetic Wound/Ulcer of the Lower Extremity] [N/A:N/A] Secondary Etiology: [2:Lymphedema] [N/A:N/A] Comorbid History: [2:Hypertension, Type II Diabetes, Neuropathy] [N/A:N/A] Date Acquired: [2:09/30/2017] [N/A:N/A] Weeks of Treatment: [2:38] [N/A:N/A] Wound Status: [2:Open] [N/A:N/A] Measurements L x W x D [2:0.1x0.1x0.1] [N/A:N/A] (cm) Area (cm) : [2:0.008] [N/A:N/A] Volume (cm) : [2:0.001] [N/A:N/A] % Reduction in Area: [2:100.00%] [N/A:N/A] % Reduction in Volume: [2:100.00%] [N/A:N/A] Classification: [2:Grade 2] [N/A:N/A] Exudate Amount: [2:Small] [N/A:N/A] Exudate Type: [2:Serous] [N/A:N/A] Exudate Color: [2:amber] [N/A:N/A] Wound Margin: [2:Indistinct, nonvisible] [N/A:N/A] Granulation Amount: [2:None Present (0%)] [N/A:N/A] Necrotic Amount: [2:Small (1-33%)] [N/A:N/A] Exposed Structures: [2:Fat Layer (Subcutaneous Tissue) Exposed: Yes Fascia: No Tendon: No Muscle: No Joint: No Bone: No] [N/A:N/A] Epithelialization: [2:None] [N/A:N/A] Periwound Skin Texture: [2:Excoriation: No Induration: No Callus: No Crepitus: No] [N/A:N/A] Rash: No Scarring: No Periwound Skin Moisture: Maceration: No N/A N/A Dry/Scaly: No Periwound Skin Color: Atrophie Blanche: No N/A  N/A Cyanosis: No Ecchymosis: No Erythema: No Hemosiderin Staining: No Mottled: No Pallor: No Rubor: No Temperature: No Abnormality N/A N/A Tenderness on Palpation: No N/A N/A Wound Preparation: Ulcer Cleansing: N/A N/A Rinsed/Irrigated with Saline Topical Anesthetic Applied: Other: lidocaine 4% Treatment Notes Electronic Signature(s) Signed: 09/20/2018 5:53:07 PM By: Linton Ham MD Entered By: Linton Ham on 09/20/2018 13:33:30 Belloso, Kyland AMarland Kitchen ([id number]) -------------------------------------------------------------------------------- Multi-Disciplinary Care Plan Details Patient Name: Wierman, Laakea A. Date of Service: 09/20/2018 12:30 PM Medical Record Number: [id number] Patient Account Number: [id number] Date of Birth/Sex: [birth year] (48 y.o. M) Treating RN: Cornell Barman Primary Care Juliauna Stueve: Gaetano Net Other Clinician: Referring Jaxson Keener: Gaetano Net Treating Ein Rijo/Extender: Tito Dine in Treatment: 35 Active Inactive Abuse / Safety / Falls / Self Care Management Nursing Diagnoses: Potential for falls Goals: Patient will remain injury free related to falls Date Initiated: 12/28/2017 Target Resolution Date: 04/08/2018 Goal Status: Active Interventions: Assess fall risk on admission and as needed Notes: Nutrition Nursing Diagnoses: Impaired glucose control: actual or potential Goals: Patient/caregiver verbalizes understanding of need to maintain therapeutic glucose control per primary care physician Date Initiated: 12/28/2017 Target Resolution Date: 04/08/2018 Goal Status: Active Interventions: Provide education on elevated blood sugars and impact on wound healing Notes: Orientation to the Wound Care Program Nursing Diagnoses: Knowledge deficit related to the wound healing center program Goals: Patient/caregiver will verbalize understanding of the [place] Program Date Initiated: 12/28/2017 Target Resolution Date: 04/08/2018 Goal  Status: Active Interventions: Provide education on orientation to the wound center Caya, Jarious A. ([id number]) Notes: Venous Leg Ulcer Nursing Diagnoses: Knowledge deficit related to disease process and management Goals: Patient will maintain optimal edema control Date Initiated: 03/27/2018 Target Resolution Date: 04/27/2018 Goal Status: Active Interventions: Assess peripheral edema status every visit. Compression as ordered Treatment Activities: Therapeutic compression applied : 03/22/2018 Notes: Wound/Skin Impairment Nursing Diagnoses: Impaired tissue integrity Goals: Ulcer/skin breakdown will heal within 14 weeks Date Initiated: 12/28/2017 Target Resolution Date: 04/08/2018 Goal Status: Active Interventions: Assess patient/caregiver ability to obtain necessary supplies Assess patient/caregiver ability to perform ulcer/skin care regimen upon admission and as needed Assess ulceration(s) every visit Notes: Electronic Signature(s) Signed: 09/21/2018 5:43:44 PM By: Gretta Cool, BSN, RN, CWS, Kim RN, BSN Entered By: Gretta Cool, BSN, RN, CWS, Kim on 09/20/2018 13:07:38 Highland, Anastacio A. ([id number]) -------------------------------------------------------------------------------- Non-Wound Condition Assessment Details Patient Name: Vezina, Arnett A. Date of Service: 09/20/2018 12:30 PM Medical Record Number: [id number] Patient Account Number: [id number] Date of Birth/Sex: [birth year] (48 y.o. M) Treating RN: Secundino Ginger Primary Care Dwanda Tufano: Gaetano Net Other Clinician: Referring Maiya Kates: Dayton Martes  SARAH Treating Jorel Gravlin/Extender: Ricard Dillon Weeks in Treatment: 38 Non-Wound Condition: Condition: Lymphedema Location: Leg Side: Bilateral Periwound Skin Texture Texture Color No Abnormalities Noted: No No Abnormalities Noted: No Callus: No Atrophie Blanche: No Crepitus: No Cyanosis: No Excoriation: Yes Ecchymosis: No Friable: No Erythema: No Induration: Yes Hemosiderin Staining:  Yes Rash: No Mottled: No Scarring: No Pallor: No Rubor: Yes Moisture No Abnormalities Noted: No Dry / Scaly: No Maceration: No Electronic Signature(s) Signed: 09/20/2018 4:29:26 PM By: Secundino Ginger Entered By: Secundino Ginger on 09/20/2018 12:59:04 Eyster, Zacheriah A. ([id number]) -------------------------------------------------------------------------------- Pain Assessment Details Patient Name: Deans, Shondell A. Date of Service: 09/20/2018 12:30 PM Medical Record Number: [id number] Patient Account Number: [id number] Date of Birth/Sex: 04/10/1971 (48 y.o. M) Treating RN: Secundino Ginger Primary Care Hector Venne: Gaetano Net Other Clinician: Referring Jayah Balthazar: Gaetano Net Treating Ayris Carano/Extender: Tito Dine in Treatment: 48 Active Problems Location of Pain Severity and Description of Pain Patient Has Paino No Site Locations Pain Management and Medication Current Pain Management: Notes pt denies any pain at this time. Electronic Signature(s) Signed: 09/20/2018 4:29:26 PM By: Secundino Ginger Entered By: Secundino Ginger on 09/20/2018 12:40:00 Munn, Kieffer AMarland Kitchen ([id number]) -------------------------------------------------------------------------------- Patient/Caregiver Education Details Patient Name: Titus, Kate A. Date of Service: 09/20/2018 12:30 PM Medical Record Number: [id number] Patient Account Number: [id number] Date of Birth/Gender: Feb 19, 1971 (48 y.o. M) Treating RN: Cornell Barman Primary Care Physician: Gaetano Net Other Clinician: Referring Physician: Gaetano Net Treating Physician/Extender: Tito Dine in Treatment: 24 Education Assessment Education Provided To: Patient Education Topics Provided Wound/Skin Impairment: Handouts: Caring for Your Ulcer Methods: Demonstration, Explain/Verbal Responses: State content correctly Electronic Signature(s) Signed: 09/21/2018 5:43:44 PM By: Gretta Cool, BSN, RN, CWS, Kim RN, BSN Entered By: Gretta Cool, BSN, RN, CWS, Kim on 09/20/2018  13:13:14 Nerio, Cedrick AMarland Kitchen ([id number]) -------------------------------------------------------------------------------- Wound Assessment Details Patient Name: Lockamy, Senai A. Date of Service: 09/20/2018 12:30 PM Medical Record Number: [id number] Patient Account Number: [id number] Date of Birth/Sex: 16-May-1971 (48 y.o. M) Treating RN: Secundino Ginger Primary Care Damauri Minion: Gaetano Net Other Clinician: Referring Fabrizzio Marcella: Gaetano Net Treating Hezekiah Veltre/Extender: Tito Dine in Treatment: 38 Wound Status Wound Number: 2 Primary Etiology: Diabetic Wound/Ulcer of the Lower Extremity Wound Location: Left Lower Leg - Lateral, Posterior Secondary Lymphedema Wounding Event: Gradually Appeared Etiology: Date Acquired: 09/30/2017 Wound Status: Open Weeks Of Treatment: 38 Comorbid History: Hypertension, Type II Diabetes, Clustered Wound: No Neuropathy Photos Photo Uploaded By: Secundino Ginger on 09/20/2018 14:34:49 Wound Measurements Length: (cm) 0.1 Width: (cm) 0.1 Depth: (cm) 0.1 Area: (cm) 0.008 Volume: (cm) 0.001 % Reduction in Area: 100% % Reduction in Volume: 100% Epithelialization: None Tunneling: No Undermining: No Wound Description Classification: Grade 2 Wound Margin: Indistinct, nonvisible Exudate Amount: Small Exudate Type: Serous Exudate Color: amber Foul Odor After Cleansing: No Slough/Fibrino Yes Wound Bed Granulation Amount: None Present (0%) Exposed Structure Necrotic Amount: Small (1-33%) Fascia Exposed: No Necrotic Quality: Adherent Slough Fat Layer (Subcutaneous Tissue) Exposed: Yes Tendon Exposed: No Muscle Exposed: No Joint Exposed: No Bone Exposed: No Periwound Skin Texture Wirt, Paul A. ([id number]) Texture Color No Abnormalities Noted: No No Abnormalities Noted: No Callus: No Atrophie Blanche: No Crepitus: No Cyanosis: No Excoriation: No Ecchymosis: No Induration: No Erythema: No Rash: No Hemosiderin Staining: No Scarring:  No Mottled: No Pallor: No Moisture Rubor: No No Abnormalities Noted: No Dry / Scaly: No Temperature / Pain Maceration: No Temperature: No Abnormality Wound Preparation Ulcer Cleansing: Rinsed/Irrigated with Saline Topical Anesthetic Applied: Other: lidocaine 4%, Treatment Notes Wound #2 (Left, Lateral, Posterior Lower Leg)  Notes Silvercell,gauze, conform and tape Electronic Signature(s) Signed: 09/20/2018 4:29:26 PM By: Secundino Ginger Entered By: Secundino Ginger on 09/20/2018 12:55:46 Feliz, Khiree A. ([id number]) -------------------------------------------------------------------------------- Vitals Details Patient Name: Remington, Abdoul A. Date of Service: 09/20/2018 12:30 PM Medical Record Number: [id number] Patient Account Number: [id number] Date of Birth/Sex: [birth year] (48 y.o. M) Treating RN: Secundino Ginger Primary Care Julius Matus: Gaetano Net Other Clinician: Referring Torie Priebe: Gaetano Net Treating Kaytlan Behrman/Extender: Tito Dine in Treatment: 42 Vital Signs Time Taken: 12:40 Temperature (F): 97.9 Height (in): 76 Pulse (bpm): 64 Weight (lbs): 401.6 Respiratory Rate (breaths/min): 18 Body Mass Index (BMI): 48.9 Blood Pressure (mmHg): 184/90 Reference Range: 80 - 120 mg / dl Electronic Signature(s) Signed: 09/20/2018 4:29:26 PM By: Secundino Ginger Entered BySecundino Ginger on 09/20/2018 12:44:51

## 2018-10-11 ENCOUNTER — Telehealth (INDEPENDENT_AMBULATORY_CARE_PROVIDER_SITE_OTHER): Payer: Self-pay | Admitting: Vascular Surgery

## 2018-10-11 NOTE — Telephone Encounter (Signed)
Patient has a appointment on 10-12-18.

## 2018-10-11 NOTE — Telephone Encounter (Signed)
Advise on below

## 2018-10-11 NOTE — Telephone Encounter (Signed)
The patient needs to have an HDA and he should be seen with Dr. 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

## 2018-10-12 ENCOUNTER — Other Ambulatory Visit: Payer: Self-pay

## 2018-10-12 ENCOUNTER — Ambulatory Visit (INDEPENDENT_AMBULATORY_CARE_PROVIDER_SITE_OTHER): Payer: BLUE CROSS/BLUE SHIELD

## 2018-10-12 ENCOUNTER — Ambulatory Visit (INDEPENDENT_AMBULATORY_CARE_PROVIDER_SITE_OTHER): Payer: BLUE CROSS/BLUE SHIELD | Admitting: Vascular Surgery

## 2018-10-12 ENCOUNTER — Encounter (INDEPENDENT_AMBULATORY_CARE_PROVIDER_SITE_OTHER): Payer: Self-pay | Admitting: Vascular Surgery

## 2018-10-12 VITALS — BP 179/79 | HR 56 | Resp 16 | Ht 76.0 in | Wt 391.0 lb

## 2018-10-12 DIAGNOSIS — L97329 Non-pressure chronic ulcer of left ankle with unspecified severity: Secondary | ICD-10-CM

## 2018-10-12 DIAGNOSIS — I83023 Varicose veins of left lower extremity with ulcer of ankle: Secondary | ICD-10-CM

## 2018-10-12 DIAGNOSIS — N186 End stage renal disease: Secondary | ICD-10-CM

## 2018-10-12 DIAGNOSIS — N184 Chronic kidney disease, stage 4 (severe): Secondary | ICD-10-CM | POA: Diagnosis not present

## 2018-10-12 DIAGNOSIS — I872 Venous insufficiency (chronic) (peripheral): Secondary | ICD-10-CM

## 2018-10-12 DIAGNOSIS — E119 Type 2 diabetes mellitus without complications: Secondary | ICD-10-CM

## 2018-10-12 DIAGNOSIS — Z992 Dependence on renal dialysis: Secondary | ICD-10-CM

## 2018-10-12 DIAGNOSIS — I10 Essential (primary) hypertension: Secondary | ICD-10-CM

## 2018-10-12 DIAGNOSIS — I890 Lymphedema, not elsewhere classified: Secondary | ICD-10-CM

## 2018-10-13 ENCOUNTER — Encounter (INDEPENDENT_AMBULATORY_CARE_PROVIDER_SITE_OTHER): Payer: Self-pay

## 2018-10-15 ENCOUNTER — Encounter (INDEPENDENT_AMBULATORY_CARE_PROVIDER_SITE_OTHER): Payer: Self-pay | Admitting: Vascular Surgery

## 2018-10-15 NOTE — Progress Notes (Signed)
Patient ID: [name], male   DOB: July 14, 1971, 48 y.o.   MRN: [id number]  Chief Complaint  Patient presents with  . Follow-up    ultrasound of access    HPI [name] is a 48 y.o. male.    Patient has a functioning fistula with an excellent duplex and volume flow of 1468.    Past Medical History:  Diagnosis Date  . Chronic kidney disease   . Diabetes mellitus without complication ([place])   . Hypertension   . Lymphedema of leg   . Microalbuminuria   . Sleep apnea    no CPAP  . Vitamin D deficiency     Past Surgical History:  Procedure Laterality Date  . APPLICATION OF WOUND VAC Left 10/03/2017   Procedure: APPLICATION OF WOUND VAC;  Surgeon: Algernon Huxley, MD;  Location: [facility];  Service: General;  Laterality: Left;  . APPLICATION OF WOUND VAC Left 10/11/2017   Procedure: APPLICATION OF WOUND VAC;  Surgeon: Katha Cabal, MD;  Location: [facility];  Service: Vascular;  Laterality: Left;  . APPLICATION OF WOUND VAC Left 10/14/2017   Procedure: WOUND VAC CHANGE;  Surgeon: Katha Cabal, MD;  Location: [facility];  Service: Vascular;  Laterality: Left;  left lower leg  . APPLICATION OF WOUND VAC Left 10/18/2017   Procedure: WOUND VAC CHANGE;  Surgeon: Katha Cabal, MD;  Location: [facility];  Service: Vascular;  Laterality: Left;  . APPLICATION OF WOUND VAC Left 10/07/2017   Procedure: APPLICATION OF WOUND VAC;  Surgeon: Katha Cabal, MD;  Location: [facility];  Service: Vascular;  Laterality: Left;  . AV FISTULA INSERTION W/ RF MAGNETIC GUIDANCE Right 07/19/2018   Procedure: AV FISTULA INSERTION W/RF MAGNETIC GUIDANCE;  Surgeon: Katha Cabal, MD;  Location: [facility];  Service: Cardiovascular;  Laterality: Right;  . I&D EXTREMITY Left 10/11/2017   Procedure: IRRIGATION AND DEBRIDEMENT EXTREMITY;  Surgeon: Katha Cabal, MD;  Location: [facility];  Service: Vascular;  Laterality: Left;  . INCISION AND DRAINAGE ABSCESS Right 10/07/2017    Procedure: INCISION AND DRAINAGE ABSCESS;  Surgeon: Katha Cabal, MD;  Location: [facility];  Service: Vascular;  Laterality: Right;  . IRRIGATION AND DEBRIDEMENT ABSCESS Left 10/03/2017   Procedure: IRRIGATION AND DEBRIDEMENT ABSCESS with debridement of skin, soft tissue, muscle 50sq cm;  Surgeon: Algernon Huxley, MD;  Location: [facility];  Service: General;  Laterality: Left;      Allergies  Allergen Reactions  . Lisinopril Swelling  . Adhesive [Tape] Itching  . Furosemide Other (See Comments)    Light headed, cough, blurry vision, weakness.    Current Outpatient Medications  Medication Sig Dispense Refill  . allopurinol (ZYLOPRIM) 100 MG tablet Take 100 mg by mouth daily.  6  . amLODipine (NORVASC) 10 MG tablet Take 10 mg by mouth daily.     [name] atenolol (TENORMIN) 100 MG tablet Take 1 tablet (100 mg total) by mouth daily. 30 tablet 0  . cloNIDine (CATAPRES) 0.1 MG tablet Take 0.1 mg by mouth 2 (two) times daily.     . ferrous gluconate (FERGON) 240 (27 FE) MG tablet Take 240 mg by mouth daily.    . Insulin Glargine (LANTUS SOLOSTAR) 100 UNIT/ML Solostar Pen Inject 12 Units into the skin 2 (two) times daily.     . metolazone (ZAROXOLYN) 5 MG tablet Take 5 mg by mouth daily. Mon, Wed, Fri    . Multiple Vitamins-Minerals (EYE HEALTH PO)  Take 1 capsule by mouth 2 (two) times daily.    . Multiple Vitamins-Minerals (MULTIVITAMIN PO) Take 2 tablets by mouth daily.    . Omega-3 Fatty Acids (FISH OIL PO) Take 2 capsules by mouth daily.    [name] torsemide (DEMADEX) 20 MG tablet Take 40 mg by mouth 2 (two) times daily.   11  . hydrALAZINE (APRESOLINE) 50 MG tablet Take 50 mg by mouth 3 (three) times daily.   0  . oxyCODONE-acetaminophen (PERCOCET) 5-325 MG tablet Take 1-2 tablets by mouth every 6 (six) hours as needed for moderate pain or severe pain. (Patient not taking: Reported on 09/12/2018) 40 tablet 0   No current facility-administered medications for this visit.         Physical  Exam BP (!) 179/79   Pulse (!) 56   Resp 16   Ht 6\' 4"  (1.93 m)   Wt (!) 391 lb (177.4 kg)   BMI 47.59 kg/m  Gen:  WD/WN, NAD Skin: incision C/D/I; good bruit hard to feel the thrill hard to identify the vein     Assessment/Plan: 1. End stage renal disease ([place]) Recommend:  The patient is experiencing increasing problems with their dialysis access.  He should have an angiogram with Marking of the fistula to allow for access.  Patient should have a fistulagram with the intention for intervention.  The intention for intervention is to restore appropriate flow and prevent thrombosis and possible loss of the access.  As well as improve the quality of dialysis therapy.  The risks, benefits and alternative therapies were reviewed in detail with the patient.  All questions were answered.  The patient agrees to proceed with angio/intervention.           [name] 10/15/2018, 4:19 PM   This note was created with Dragon medical transcription system.  Any errors from dictation are unintentional.

## 2018-10-18 ENCOUNTER — Encounter: Payer: BLUE CROSS/BLUE SHIELD | Attending: Internal Medicine | Admitting: Internal Medicine

## 2018-10-18 DIAGNOSIS — E1122 Type 2 diabetes mellitus with diabetic chronic kidney disease: Secondary | ICD-10-CM | POA: Diagnosis not present

## 2018-10-18 DIAGNOSIS — Z992 Dependence on renal dialysis: Secondary | ICD-10-CM | POA: Insufficient documentation

## 2018-10-18 DIAGNOSIS — L97922 Non-pressure chronic ulcer of unspecified part of left lower leg with fat layer exposed: Secondary | ICD-10-CM | POA: Diagnosis not present

## 2018-10-18 DIAGNOSIS — N184 Chronic kidney disease, stage 4 (severe): Secondary | ICD-10-CM | POA: Insufficient documentation

## 2018-10-18 DIAGNOSIS — E11622 Type 2 diabetes mellitus with other skin ulcer: Secondary | ICD-10-CM | POA: Diagnosis present

## 2018-10-18 DIAGNOSIS — I890 Lymphedema, not elsewhere classified: Secondary | ICD-10-CM | POA: Insufficient documentation

## 2018-10-19 NOTE — Progress Notes (Signed)
TREVEL, DILLENBECK ([id number]) Visit Report for 10/18/2018 HPI Details Patient Name: [name], [name] Date of Service: 10/18/2018 12:30 PM Medical Record Number: [id number] Patient Account Number: [id number] Date of Birth/Sex: October 23, 1970 (48 y.o. M) Treating RN: Cornell Barman Primary Care Provider: Gaetano Net Other Clinician: Referring Provider: Gaetano Net Treating Provider/Extender: Tito Dine in Treatment: 13 History of Present Illness HPI Description: 12/28/17; this is a now 48 year old man who is a type II diabetic. He was hospitalized from 10/01/17 through 10/19/17. He had an MSSA soft tissue and skin infection. 2 open areas on the left leg were identified he has a smaller area on the left medial calf superiorly just below the knee and a wound just above the left ankle on the posterior medial aspect. I think both of these were surgical IandD sites when he was in the hospital. He was discharged with a wound VAC at that point however this is since been taken off. He follows with Dr. Ola Spurr for the [facility] and he is still on chronic Keflex at 500 twice a day. At that time he was hospitalized his hemoglobin A1c was 15.1 however if I'm reading his endocrinologist notes correctly that is improved. He has been following with Dr. [name] at vein and vascular and he has been applying calcium alginate and Unna boots. He has home health changing the dressing. They have also been attempting to get him external compression pumps although the patient is unaware whether they've been approved by insurance at this point. as mentioned he has a smaller clean wound on the right lateral calf just below the knee and he has a much larger area just above the left ankle medially and posteriorly. Our intake nurse reported greenish purulent looking drainage.the patient did have surgical material sent to pathology in February. This showed chronic abscess The patient also has lymphedema stage III in the left  greater than right lower extremities. He has a history of blisters with wounds but these of all were always healed. The patient thinks that the lymphedema may have been present since he was about 48 years old i.e. about 30 years. He does not have graded pressure stockings and has not worn stockings. He does not have a distant history of DVT PE or phlebitis. He has not been systemically unwell fever no chills. He states that his Lasix is recently been reduced. He tells me his kidney function is at "30%" and he has been followed by Dr. [name] of nephrology. At one point he was on Lasix 80 twice a day however that's been cut back and he is now on Lasix at 20 twice a day. The patient has a history of PAD listed in his records although he comes from Dr. [name] I don't think is felt to have significant PAD. ABIs in our clinic were noncompressible bilaterally. 01/04/18; patient has a large wound on the left lateral lower calf and a small wound on the left medial upper calf. He has been to see his nephrologist who changed him to Demadex 40 mg a day. I'm hopeful this will help with his systemic fluid overload. He also has stage III lymphedema. Really no change in the 2 wounds since last week 01/11/18; the patient is down 13 pounds. He put his stage III lymphedema left leg in 4K compression last week and there is less edema fluid however we still haven't been able to communicate with home health but apparently it is [facility] but the dressings have not been changed. The  patient noted an odor last week. He is also had compression pumps ordered by [facility] this as a not completed the paperwork stage. 01/18/18; patient continues to lose weight. Stage III lymphedema in the left greater than right leg under for alert compression. The major wound is on the left lateral ankle area. He apparently has bilateral compression pumps being brought to his house, these were ordered by [facility] Notable for the fact today he had some blisters on the right anterior leg together with some skin nodules. This is no doubt secondary to severe lymphedema. 01/25/18; the patient has obtained his compression pumps and is using them per vein and vascular instructions 3 times a day for an hour. He also saw [name] of vein and vascular. He was felt to have venous insufficiency but did not suggest any intervention also improved edema. It was suggested that he have compression stockings 20-30 mm on a daily basis in addition to compression pumps. Wehrli, Wyett A. ([id number]) The patient arrives in clinic today with a layer of unna under for layer compression. he seems to have some trouble with the degree of compression. He has open areas on the left lateral ankle area which is his major wound left upper medial calf and a superficial open area on the right anterior shin area which was blistered last week. He has skin changes on the right anterior calf which I think are no doubt secondary to lymphedema skin nodules etc. 02/01/18; the patient comes in telling us his nephrologist have to his torsemide. Unfortunately today he is put on 7 pounds by our scales. He has blisters all over the anterior and medial part of his right calf and a new open wound. He also has soupy green drainage coming out of the left lateral calf /ankle wound. 02/08/18; culture I did last week of the left lateral ankle wound grew both Pseudomonas and Morganella. He is on Keflex from Dr. Ola Spurr in the hospital. I will need to review these notes.in any case Keflex is not going to cover these 2 organisms. I'm probably going to added ciprofloxacin today for 1 week. A lot of drainage that looks purulent last week. He is not complaining of pain however he has managed to put on 10 pounds in 2 weeks by our scales in this clinic. He is going to see his nephrologist tomorrow 02/15/18; he completed the ciprofloxacin I gave him last week.  Notable that he is up to 379 pounds today which is up 16 pounds from 2 weeks ago. He is complaining of orthopnea but doesn't have any chest pain. 02/22/18; he continues to have weight gain. R intake nurse reports again purulent green drainage coming out of the lateral wound on the lateral left calf. He has small open area on the right anterior leg. His torsemide was increased to 2 tablets a day I believe this is 40 mg last week in response to the call admitted to Dr. Keturah Barre office. He follows up with Dr. [name] and Dr. Delana Meyer tomorrow 03/01/18 his weight essentially stable today at 383 pounds. Drainage out of the left lateral wound on the lower left calf/ankle is a lot less. Culture last time grew Pseudomonas. I put him on cefdinir. He has been to Dr. Keturah Barre office no adjustments in his diuretics. Dr. Nicoletta Dress prescribed a wraparound stocking for the right leg.there is no open area on the right leg. He has a superficial area on the left medial calf, left posterior  calf and in the large area on the left lateral however this looks better 03/15/18; weight is not up to 393 pounds. He saw his nephrologist yesterday Dr. [name] will increase the Demadex I'm hopeful this will help with the edema control. I'm using silver alginate to all his wounds. In particular the left lateral ankle looks better. oUnfortunately he has new open areas on the right lateral calf that will include use of his compression stocking at least in the short to medium term. He has new wounds o3 on the right lateral calf. One of these has some size however all numerous superficial 03/22/18; his weight is stabilized a bit. Just adjustment of his diuretics by his nephrologist. There is no doubt he has some degree of systemic fluid overload on top of severe left greater than right lymphedema. 2 weeks ago tried to transition him to stockings on the right leg however he developed re-breakdown of skin on the right leg and we had to put him back  in compression last week. He also uses external compression pumps and claims to be compliant Continued concern about his depression today 03/29/18; several ongoing issues with this patient; oHe no longer has home health coverage apparently secondary to a lapse in insurance. He is apparently transitioning from short for long-term disability. [facility] was changing his compression wraps on Monday and Friday. oHe has gained 10 pounds since last week oSees Dr. [name] tomorrow oSaw his primary doctor last week about the depression. It sounds as though he declined pharmacologic management. He seems somewhat better today. We were concerned last week when he came. Somewhat better today oHe is using his compression pumps once a day at home, I have asked for twice a day if possible especially on the left leg oFollows up with his nephrologist in mid-August. He is managing his diuretic for I think stage IV chronic renal failure oParadoxically his wounds actually look better 04/19/18; the patient has not been seen since I last saw him 3 weeks ago. He saw Dr. Delana Meyer of vascular surgery on 03/30/18 I believe he put him in a 20/30 stocking bilaterally with a wraparound extremitease stocking. He has not been putting anything specifically on the wound. More problematic than that he has not been wearing the stockings he is at home. He has been using his external compression pumps once per day according to him on a rare occasion twice On a psychosocial level the patient is now on long-term disability and is applying for COBRA therefore he is between insurances. He has not been able to follow up with Dr. [name] who is his nephrologist as a result. As noted his weight is up to 407 pounds today. He promises me he'll follow-up with Dr. [name] 05/03/18; he hasn't been here in 2 weeks now. He apparently has been wearing a compression sock on the left leg. Massive increase in edema 3 large open wounds on the left anterior  leg that were probably blisters. Significant deterioration in the left lateral ankle wound that we've been doing as his most problematic wound. He still does not have his insurance issues wrapped up. His weight is well over 400 pounds. 05/10/18; arrives today with better looking edema control in the left leg. He has been using his compression pumps twice a day. We also wrapped it is left leg for there he's been using his pumps so there is much better edema control. Most of new Goda, Lenzie A. ([id number]) wounds from last week look a  lot better. Even the refractory area on the lower left lateral ankle looks a lot better to me today. He follows up with Dr. [name] this morning [nephrology] 05/17/18 patient arrives today with a lot less edema in the left leg. His weight is gone down 7 pounds. He tells me he saw Dr. [name] but his torsemide was not adjusted. He is using the palms 3 times a day. There is been quite an improvement in the remaining wound on the left lateral ankle oWeekly visit for follow-up of bilateral lower extremity wounds related to severe lymphedema and probably some degree of systemic fluid overload from chronic renal failure stage IV. His weight is up this week to over 400 pounds. He noted increasing edema in the right leg late last week. He took his compression stocking off he did not increase the frequency of this compression pump use. He developed a large blister on the back of the right calf. He also has a new opened blister on the left lateral calf in addition to the wound that we've been using on the distal left lower calf area and we've been using silver alginate. He tells me that he has an ultrasound which is a DVT rule out and I think a reflux study ordered by Dr. [name] 05/31/18; weekly visit. He went to see vein and vascular this week apparently they remove the 4-layer compression on the left and put an Unna boot on him in replacement. He's got more swelling in the left leg  is resolved. That being said his left lower Wound is better. He still has a fairly large wound on the right posterior calf this was not disturbed. He has a follow-up appointment with Dr. Keturah Barre nephrologist next week 06/07/2018; the patient arrives today with a history that he took both his compression wraps off 3 days ago in order to take a shower. He has bilateral severe tense blisters. A lot of weeping erythema especially on the lateral left calf. Almost circumferential blisters on the right. Multiple areas of epithelial breakdown. He does not complain of any pain fever chills. He states he has been using his compression pump in some form of stocking that I could not really determine the type. He did not come into the clinic with anything on his legs. He tells me he has an appointment with Dr. Merita Norton his nephrologist I believe this Friday. Paradoxically his weight is actually down to 385 pounds I believe last week he was over 400 06/14/18; arrives with better looking wound surfaces today on both legs. There is no further blistering however there are still areas that aren't epithelialized on the right anterior, right lateral, left posterior and left medial. His original wound just above the left ankle laterally is still open moist. His weight was about the same today. He tells me that his nephrologist increase the torsemide from 40/20/09/1939/40 twice a day 06/21/18; both his wraps fell down to his mid calf. Has a result he has multiple blisters across the anterior right leg above with the wraps ultimately watched. He also has blisters on the posterior part of the left calf o2. His original wound on the left lateral calf is hard to see any open area. There is however a divot. Which is going to be difficult to deal with into the future until the edema in the left leg is controlled. 2 small superficial areas remain He tells [place] his weight was 386 pounds on his scale at home. According to our scale he  is up 5 pounds. He is not on a fluid restriction 06/28/18; patient's weight is gone up 2 pounds since last time. He has weeping edema and open superficial wounds on the right posterior right lateral and right anterior calf. He has a small open area on the left anterior probably left posterior calf and the original wound on the left lateral calf appears to be just about closed He is being planned for a dialysis shunt in the right arm through [facility] incoordination with his nephrologist Dr. [name] He claims to be using his compression pumps twice a day. He has lymphedema and no doubt systemic fluid volume overload from stage IV chronic renal failure 07/04/18; patient's weight is up into the 396 range. He is supposed to see his cardiologist tomorrow and plans are being made for a shunt by Dr. Delana Meyer. He still has significant open wounds/draining areas on the right anterior and right posterior calf. Several small open areas on the left which are less in terms of wound area on the right which is surprising given the fact the left is the larger most lymphedematous leg 07/26/2018 She is seen today for follow-up and management of lateral posterior lower leg wound and bilateral lymphoedema. He has a very flat affect and depressed mood today. Unable to elicit much information from him today. No thoughts of harm to self identified. Recently had a follow-up with vascular vein for fistula placement to initiate dialysis in the right arm. He has 2 dressings on the right arm from the fistula procedure with no bleeding or drainage noted on the dressing. He does have a large amount of bruising in the surrounding to puncture areas on the right lower arm from the fistula placement. No pain elicited with palpation of the right arm. He denies a diminished sensation in that right lower arm as well. He does have a small wound to the left lateral posterior lower leg. No visual drainage present to either leg.  However the wound to the left lower leg does have a foul odor even after cleaning it. The left is the larger most lymphedematous leg with the right Leg still having a significant amount of edema. No drainage or blisters present on either leg today. He states that he is using the lymphedema pumps twice a day. Not too sure if he is compliant with actually using the lymphedema pumps based on the amount of edema that he has in his legs. Weight increased from 396.1 to 401.6. No recent fevers, chills, or shortness of breath. 08/02/18; the patient only has one remaining wound on the left lateral calf which is still open. This is in the resultant [facility]. ([id number]) shaped area which was once the site of the major wound. He does not have any open areas on the right leg nor additional wounds on the left no blistering. As usual the left leg is much larger than the right. He comes in today stating that he took the wraps off on the right leg on Friday and since then he has presumably been wearing his stocking which is a wraparound variant stocking on the right. States he took the 4-layer compression off his left leg this morning to shower. He is seeing vein and vascular tomorrow. He states he is using his compression pumps once or twice a day. He also admits that he has been on his feet a lot 08/09/18; the patient has the left leg healed except for the original wound site on  the left medial Just above the ankle. On the right he has a new wound on the right lateral calf. He saw vein and vascular last week and he is been back in his pressure stockings and wraparound stockings. He also is been put on metolazone 3 times a week by nephrology along with his torsemide. I'm hopeful that this will help with some of the lower extremity edema. I've always felt that he has lymphedema but there may be a secondary component contributing 08/16/18; the patient used his own junk slight stockings to both legs last  week. He claims he is pumping once to twice a day at home. Here rise with his legs looking visibly less edematous. His usual left greater than right secondary to lymphedema. He is tolerating the metolazone 3 times a week along with the torsemide. His weight was down 2 pounds. He follows up with nephrology soon for follow-up lab work I think in early January. He does not have any open areas on the right leg he still has the crevice on the lateral left calf just above the ankle there is 2 small open areas here I am not sure that this is ever going to be free of an open area although certainly not worsening. More problematic lead he has 2 blistered areas just above the wrist on the left lateral calf 09/06/2018; the patient arrives with much larger legs than I remember seeing a month ago especially on the right. He has 1 open area on the left lateral calf and he had a 4 layer compression on this on arrival in the clinic. This was apparently put on by [facility] in response to a new open wound. He claims he is pumping once or more often twice a day although I really have a hard time believing it. He came in with nothing but stockings on his legs. There is tremendous increase in the edema bilaterally 1/22; the patient only has the one area that is not totally closed and that is the divot on the left lateral calf. I do not think we are going to be able to do anything further to this unless he loses edema fluid in his left leg when he starts dialysis which I gather is sometime soon. He is using his own wraparound compression garment. He is using the compression pumps twice a day. 2/19; the patient does not have any open wounds on the right leg but both legs left and the right have extensive edema is usually worse on the left. He has a tense blister on the left anterior leg. Using his compression pumps usually once a day per his description. He does not come in with the compression stockings Electronic  Signature(s) Signed: 10/18/2018 6:18:32 PM By: Linton Ham MD Entered By: Linton Ham on 10/18/2018 13:20:56 Langlinais, Seung A. ([id number]) -------------------------------------------------------------------------------- Incision and Drainage Details Patient Name: Menchaca, Yonah A. Date of Service: 10/18/2018 12:30 PM Medical Record Number: [id number] Patient Account Number: [id number] Date of Birth/Sex: 13-Nov-1970 (48 y.o. M) Treating RN: Cornell Barman Primary Care Provider: Gaetano Net Other Clinician: Referring Provider: Gaetano Net Treating Provider/Extender: Tito Dine in Treatment: 42 Incision And Drainage Wound #2 Left, Lateral, Posterior Lower Leg Performed for: Performed By: Physician Ricard Dillon, MD Incision And Drainage Abscess Type: Location: left lateral lower leg Level of Consciousness Awake and Alert (Pre-procedure): Pre-procedure Yes - 12:55 Verification/Time Out Taken: Pain Control: Lidocaine Drainage Of: Serous Instrument: Blade Bleeding: Minimum Hemostasis Achieved: Pressure Culture Sent: None Procedural Pain:  0 Post Procedural Pain: 0 Response to Treatment: Procedure was tolerated well Level of Consciousness Awake and Alert (Post-procedure): Post Procedure Diagnosis Same as Pre-procedure Electronic Signature(s) Signed: 10/18/2018 6:18:32 PM By: Linton Ham MD Entered By: Linton Ham on 10/18/2018 13:18:36 Blanchfield, Forbes A. ([id number]) -------------------------------------------------------------------------------- Physical Exam Details Patient Name: Bloyd, Thelonious A. Date of Service: 10/18/2018 12:30 PM Medical Record Number: [id number] Patient Account Number: [id number] Date of Birth/Sex: [birth year] (48 y.o. M) Treating RN: Cornell Barman Primary Care Provider: Gaetano Net Other Clinician: Referring Provider: Gaetano Net Treating Provider/Extender: Tito Dine in Treatment: 71 Constitutional Patient is  hypertensive.. Pulse regular and within target range for patient.[name] Respirations regular, non-labored and within target range.. Temperature is normal and within the target range for the patient.[name] appears in no distress. Eyes Conjunctivae clear. No discharge. Respiratory Respiratory effort is easy and symmetric bilaterally. Rate is normal at rest and on room air.. Bilateral breath sounds are clear and equal in all lobes with no wheezes, rales or rhonchi.. Cardiovascular Heart sounds are normal no murmurs JVP is however elevated.. Massive edema in the left leg much greater than the right. This is nonpitting there is no evidence of infection. His thighs are equal bilaterally no tenderness. Lymphatic None palpable in the popliteal area bilaterally. Integumentary (Hair, Skin) Lymphedema bilaterally. No other skin lesions. Psychiatric No evidence of depression, anxiety, or agitation. Calm, cooperative, and communicative. Appropriate interactions and affect.. Electronic Signature(s) Signed: 10/18/2018 6:18:32 PM By: Linton Ham MD Entered By: Linton Ham on 10/18/2018 13:22:21 Debose, [name] ([id number]) -------------------------------------------------------------------------------- Physician Orders Details Patient Name: Palmeri, Holdan A. Date of Service: 10/18/2018 12:30 PM Medical Record Number: [id number] Patient Account Number: [id number] Date of Birth/Sex: [birth year] (48 y.o. M) Treating RN: Cornell Barman Primary Care Provider: Gaetano Net Other Clinician: Referring Provider: Gaetano Net Treating Provider/Extender: Tito Dine in Treatment: 57 Verbal / Phone Orders: No Diagnosis Coding Wound Cleansing Wound #2 Left,Lateral,Posterior Lower Leg o Clean wound with Normal Saline. o Cleanse wound with mild soap and water Anesthetic (add to Medication List) Wound #2 Left,Lateral,Posterior Lower Leg o Topical Lidocaine 4% cream applied to wound bed prior to  debridement (In Clinic Only). Primary Wound Dressing Wound #2 Left,Lateral,Posterior Lower Leg o Silver Alginate - on all draining areas Secondary Dressing Wound #2 Left,Lateral,Posterior Lower Leg o ABD pad Dressing Change Frequency Wound #2 Left,Lateral,Posterior Lower Leg o Change dressing every week Follow-up Appointments Wound #2 Left,Lateral,Posterior Lower Leg o Return Appointment in 1 week. o Nurse Visit as needed - Friday Edema Control Wound #2 Left,Lateral,Posterior Lower Leg o 4-Layer Compression System - Left Lower Extremity. o Patient to wear own compression stockings - Right o Compression Pump: Use compression pump on left lower extremity for 30 minutes, twice daily. - 1 Hour three times daily o Compression Pump: Use compression pump on right lower extremity for 30 minutes, twice daily. - 1 Hour three times daily Electronic Signature(s) Signed: 10/18/2018 6:18:32 PM By: Linton Ham MD Signed: 10/18/2018 6:42:35 PM By: Gretta Cool, BSN, RN, CWS, Kim RN, BSN Entered By: Gretta Cool, BSN, RN, CWS, Kim on 10/18/2018 13:04:53 Grieco, Delan A. ([id number]) Mccaffery, Branton A. ([id number]) -------------------------------------------------------------------------------- Problem List Details Patient Name: Nadeem, Trevino A. Date of Service: 10/18/2018 12:30 PM Medical Record Number: [id number] Patient Account Number: [id number] Date of Birth/Sex: Jan 06, 1971 (48 y.o. 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 ([id number]) -------------------------------------------------------------------------------- Progress Note Details Patient Name: Moss, Saivon A. Date of Service: 10/18/2018 12:30 PM Medical Record Number: [id number] Patient Account Number: [id number] Date of Birth/Sex: February 12, 1971 (48 y.o. M) Treating RN: Cornell Barman Primary Care Provider: Gaetano Net Other Clinician: Referring Provider: Gaetano Net Treating Provider/Extender: Tito Dine in Treatment: 42 Subjective History of Present Illness (HPI) 12/28/17; this is a now 48 year old man who is a type II diabetic. He was hospitalized from 10/01/17 through 10/19/17. He had an MSSA soft tissue and skin infection. 2 open areas on the left leg were identified he has a smaller area on the left medial calf superiorly just below the knee and a wound just above the left ankle on the posterior medial aspect. I think both of these were surgical IandD sites when he was in the hospital. He was discharged with a wound VAC at that point however this is since been taken off. He follows with Dr. Ola Spurr for the [facility] and he is still on chronic Keflex at 500 twice a day. At that time he was hospitalized his hemoglobin A1c was 15.1 however if I'm reading his endocrinologist notes correctly that is improved. He has been following with Dr. [name] at vein and vascular and he has been applying calcium alginate and Unna boots. He has home health changing the dressing. They have also been attempting to get him external compression pumps although the patient is unaware whether they've been  approved by insurance at this point. as mentioned he has a smaller clean wound on the right lateral calf just below the knee and he has a much larger area just above the left ankle medially and posteriorly. Our intake nurse reported greenish purulent looking drainage.the patient did have surgical material sent to pathology in February. This showed chronic abscess The patient also has lymphedema stage III in the left greater than right lower extremities. He has a history of blisters with wounds but these of all were always healed. The patient thinks that the lymphedema may have been present since he was about 48 years old i.e. about 30 years. He does not have graded pressure stockings and has not worn stockings. He does not have a distant history of DVT PE or phlebitis. He has not been systemically unwell fever no chills. He states that his Lasix is recently been reduced. He tells me his kidney function is at "30%" and he has been followed by Dr. [name] of nephrology. At one point he was on Lasix 80 twice a day however that's been cut back and he is now on Lasix at 20 twice a day. The patient has a history of PAD listed in his records although he comes from Dr. [name] I don't think is felt to have significant PAD. ABIs in our clinic were noncompressible bilaterally. 01/04/18; patient has a large wound on the left lateral lower calf and a small wound on the left medial upper calf. He has been to  see his nephrologist who changed him to Demadex 40 mg a day. I'm hopeful this will help with his systemic fluid overload. He also has stage III lymphedema. Really no change in the 2 wounds since last week 01/11/18; the patient is down 13 pounds. He put his stage III lymphedema left leg in 4K compression last week and there is less edema fluid however we still haven't been able to communicate with home health but apparently it is [facility] but the dressings have not been changed. The patient noted an odor last  week. He is also had compression pumps ordered by [facility] this as a not completed the paperwork stage. 01/18/18; patient continues to lose weight. Stage III lymphedema in the left greater than right leg under for alert compression. The major wound is on the left lateral ankle area. He apparently has bilateral compression pumps being brought to his house, these were ordered by [facility] Notable for the fact today he had some blisters on the right anterior leg together with some skin nodules. This is no doubt secondary to severe lymphedema. 01/25/18; the patient has obtained his compression pumps and is using them per vein and vascular instructions 3 times a day for an hour. He also saw [name] of vein and vascular. He was felt to have venous insufficiency but did not suggest any intervention also improved edema. It was suggested that he have compression stockings 20-30 mm on a daily basis in addition to compression pumps. The patient arrives in clinic today with a layer of unna under for layer compression. he seems to have some trouble with the degree of compression. He has open areas on the left lateral ankle area which is his major wound left upper medial calf and a Mcgowen, Amiel A. ([id number]) superficial open area on the right anterior shin area which was blistered last week. He has skin changes on the right anterior calf which I think are no doubt secondary to lymphedema skin nodules etc. 02/01/18; the patient comes in telling us his nephrologist have to his torsemide. Unfortunately today he is put on 7 pounds by our scales. He has blisters all over the anterior and medial part of his right calf and a new open wound. He also has soupy green drainage coming out of the left lateral calf /ankle wound. 02/08/18; culture I did last week of the left lateral ankle wound grew both Pseudomonas and Morganella. He is on Keflex from Dr. Ola Spurr in the hospital. I will  need to review these notes.in any case Keflex is not going to cover these 2 organisms. I'm probably going to added ciprofloxacin today for 1 week. A lot of drainage that looks purulent last week. He is not complaining of pain however he has managed to put on 10 pounds in 2 weeks by our scales in this clinic. He is going to see his nephrologist tomorrow 02/15/18; he completed the ciprofloxacin I gave him last week. Notable that he is up to 379 pounds today which is up 16 pounds from 2 weeks ago. He is complaining of orthopnea but doesn't have any chest pain. 02/22/18; he continues to have weight gain. R intake nurse reports again purulent green drainage coming out of the lateral wound on the lateral left calf. He has small open area on the right anterior leg. His torsemide was increased to 2 tablets a day I believe this is 40 mg last week in response to the call admitted to Dr. Keturah Barre  office. He follows up with Dr. [name] and Dr. Delana Meyer tomorrow 03/01/18 his weight essentially stable today at 383 pounds. Drainage out of the left lateral wound on the lower left calf/ankle is a lot less. Culture last time grew Pseudomonas. I put him on cefdinir. He has been to Dr. Keturah Barre office no adjustments in his diuretics. Dr. Nicoletta Dress prescribed a wraparound stocking for the right leg.there is no open area on the right leg. He has a superficial area on the left medial calf, left posterior calf and in the large area on the left lateral however this looks better 03/15/18; weight is not up to 393 pounds. He saw his nephrologist yesterday Dr. [name] will increase the Demadex I'm hopeful this will help with the edema control. I'm using silver alginate to all his wounds. In particular the left lateral ankle looks better. Unfortunately he has new open areas on the right lateral calf that will include use of his compression stocking at least in the short to medium term. He has new wounds o3 on the right lateral calf. One of  these has some size however all numerous superficial 03/22/18; his weight is stabilized a bit. Just adjustment of his diuretics by his nephrologist. There is no doubt he has some degree of systemic fluid overload on top of severe left greater than right lymphedema. 2 weeks ago tried to transition him to stockings on the right leg however he developed re-breakdown of skin on the right leg and we had to put him back in compression last week. He also uses external compression pumps and claims to be compliant Continued concern about his depression today 03/29/18; several ongoing issues with this patient; He no longer has home health coverage apparently secondary to a lapse in insurance. He is apparently transitioning from short for long-term disability. [facility] was changing his compression wraps on Monday and Friday. He has gained 10 pounds since last week Sees Dr. [name] tomorrow Saw his primary doctor last week about the depression. It sounds as though he declined pharmacologic management. He seems somewhat better today. We were concerned last week when he came. Somewhat better today He is using his compression pumps once a day at home, I have asked for twice a day if possible especially on the left leg Follows up with his nephrologist in mid-August. He is managing his diuretic for I think stage IV chronic renal failure Paradoxically his wounds actually look better 04/19/18; the patient has not been seen since I last saw him 3 weeks ago. He saw Dr. Delana Meyer of vascular surgery on 03/30/18 I believe he put him in a 20/30 stocking bilaterally with a wraparound extremitease stocking. He has not been putting anything specifically on the wound. More problematic than that he has not been wearing the stockings he is at home. He has been using his external compression pumps once per day according to him on a rare occasion twice On a psychosocial level the patient is now on long-term disability and is  applying for COBRA therefore he is between insurances. He has not been able to follow up with Dr. [name] who is his nephrologist as a result. As noted his weight is up to 407 pounds today. He promises me he'll follow-up with Dr. [name] 05/03/18; he hasn't been here in 2 weeks now. He apparently has been wearing a compression sock on the left leg. Massive increase in edema 3 large open wounds on the left anterior leg that were probably blisters. Significant deterioration  in the left lateral ankle wound that we've been doing as his most problematic wound. He still does not have his insurance issues wrapped up. His weight is well over 400 pounds. 05/10/18; arrives today with better looking edema control in the left leg. He has been using his compression pumps twice a day. We also wrapped it is left leg for there he's been using his pumps so there is much better edema control. Most of new wounds from last week look a lot better. Even the refractory area on the lower left lateral ankle looks a lot better to me today. He follows up with Dr. [name] this morning [nephrology] 05/17/18 patient arrives today with a lot less edema in the left leg. His weight is gone down 7 pounds. He tells me he saw Dr. Helene Kelp, Herold A. ([id number]) [name] but his torsemide was not adjusted. He is using the palms 3 times a day. There is been quite an improvement in the remaining wound on the left lateral ankle Weekly visit for follow-up of bilateral lower extremity wounds related to severe lymphedema and probably some degree of systemic fluid overload from chronic renal failure stage IV. His weight is up this week to over 400 pounds. He noted increasing edema in the right leg late last week. He took his compression stocking off he did not increase the frequency of this compression pump use. He developed a large blister on the back of the right calf. He also has a new opened blister on the left lateral calf in addition to the wound that  we've been using on the distal left lower calf area and we've been using silver alginate. He tells me that he has an ultrasound which is a DVT rule out and I think a reflux study ordered by Dr. [name] 05/31/18; weekly visit. He went to see vein and vascular this week apparently they remove the 4-layer compression on the left and put an Unna boot on him in replacement. He's got more swelling in the left leg is resolved. That being said his left lower Wound is better. He still has a fairly large wound on the right posterior calf this was not disturbed. He has a follow-up appointment with Dr. Keturah Barre nephrologist next week 06/07/2018; the patient arrives today with a history that he took both his compression wraps off 3 days ago in order to take a shower. He has bilateral severe tense blisters. A lot of weeping erythema especially on the lateral left calf. Almost circumferential blisters on the right. Multiple areas of epithelial breakdown. He does not complain of any pain fever chills. He states he has been using his compression pump in some form of stocking that I could not really determine the type. He did not come into the clinic with anything on his legs. He tells me he has an appointment with Dr. Merita Norton his nephrologist I believe this Friday. Paradoxically his weight is actually down to 385 pounds I believe last week he was over 400 06/14/18; arrives with better looking wound surfaces today on both legs. There is no further blistering however there are still areas that aren't epithelialized on the right anterior, right lateral, left posterior and left medial. His original wound just above the left ankle laterally is still open moist. His weight was about the same today. He tells me that his nephrologist increase the torsemide from 40/20/09/1939/40 twice a day 06/21/18; both his wraps fell down to his mid calf. Has a result he has multiple blisters  across the anterior right leg above with the wraps  ultimately watched. He also has blisters on the posterior part of the left calf o2. His original wound on the left lateral calf is hard to see any open area. There is however a divot. Which is going to be difficult to deal with into the future until the edema in the left leg is controlled. 2 small superficial areas remain He tells [place] his weight was 386 pounds on his scale at home. According to our scale he is up 5 pounds. He is not on a fluid restriction 06/28/18; patient's weight is gone up 2 pounds since last time. He has weeping edema and open superficial wounds on the right posterior right lateral and right anterior calf. He has a small open area on the left anterior probably left posterior calf and the original wound on the left lateral calf appears to be just about closed He is being planned for a dialysis shunt in the right arm through [facility] incoordination with his nephrologist Dr. [name] He claims to be using his compression pumps twice a day. He has lymphedema and no doubt systemic fluid volume overload from stage IV chronic renal failure 07/04/18; patient's weight is up into the 396 range. He is supposed to see his cardiologist tomorrow and plans are being made for a shunt by Dr. Delana Meyer. He still has significant open wounds/draining areas on the right anterior and right posterior calf. Several small open areas on the left which are less in terms of wound area on the right which is surprising given the fact the left is the larger most lymphedematous leg 07/26/2018 She is seen today for follow-up and management of lateral posterior lower leg wound and bilateral lymphoedema. He has a very flat affect and depressed mood today. Unable to elicit much information from him today. No thoughts of harm to self identified. Recently had a follow-up with vascular vein for fistula placement to initiate dialysis in the right arm. He has 2 dressings on the right arm from the fistula  procedure with no bleeding or drainage noted on the dressing. He does have a large amount of bruising in the surrounding to puncture areas on the right lower arm from the fistula placement. No pain elicited with palpation of the right arm. He denies a diminished sensation in that right lower arm as well. He does have a small wound to the left lateral posterior lower leg. No visual drainage present to either leg. However the wound to the left lower leg does have a foul odor even after cleaning it. The left is the larger most lymphedematous leg with the right Leg still having a significant amount of edema. No drainage or blisters present on either leg today. He states that he is using the lymphedema pumps twice a day. Not too sure if he is compliant with actually using the lymphedema pumps based on the amount of edema that he has in his legs. Weight increased from 396.1 to 401.6. No recent fevers, chills, or shortness of breath. 08/02/18; the patient only has one remaining wound on the left lateral calf which is still open. This is in the resultant [place] shaped area which was once the site of the major wound. He does not have any open areas on the right leg nor additional wounds on the left no blistering. As usual the left leg is much larger than the right. Mollenkopf, Yossi A. ([id number]) He comes in today stating that he took  the wraps off on the right leg on Friday and since then he has presumably been wearing his stocking which is a wraparound variant stocking on the right. States he took the 4-layer compression off his left leg this morning to shower. He is seeing vein and vascular tomorrow. He states he is using his compression pumps once or twice a day. He also admits that he has been on his feet a lot 08/09/18; the patient has the left leg healed except for the original wound site on the left medial Just above the ankle. On the right he has a new wound on the right lateral calf. He saw vein and  vascular last week and he is been back in his pressure stockings and wraparound stockings. He also is been put on metolazone 3 times a week by nephrology along with his torsemide. I'm hopeful that this will help with some of the lower extremity edema. I've always felt that he has lymphedema but there may be a secondary component contributing 08/16/18; the patient used his own junk slight stockings to both legs last week. He claims he is pumping once to twice a day at home. Here rise with his legs looking visibly less edematous. His usual left greater than right secondary to lymphedema. He is tolerating the metolazone 3 times a week along with the torsemide. His weight was down 2 pounds. He follows up with nephrology soon for follow-up lab work I think in early January. He does not have any open areas on the right leg he still has the crevice on the lateral left calf just above the ankle there is 2 small open areas here I am not sure that this is ever going to be free of an open area although certainly not worsening. More problematic lead he has 2 blistered areas just above the wrist on the left lateral calf 09/06/2018; the patient arrives with much larger legs than I remember seeing a month ago especially on the right. He has 1 open area on the left lateral calf and he had a 4 layer compression on this on arrival in the clinic. This was apparently put on by [facility] in response to a new open wound. He claims he is pumping once or more often twice a day although I really have a hard time believing it. He came in with nothing but stockings on his legs. There is tremendous increase in the edema bilaterally 1/22; the patient only has the one area that is not totally closed and that is the divot on the left lateral calf. I do not think we are going to be able to do anything further to this unless he loses edema fluid in his left leg when he starts dialysis which I gather is sometime soon. He is using his  own wraparound compression garment. He is using the compression pumps twice a day. 2/19; the patient does not have any open wounds on the right leg but both legs left and the right have extensive edema is usually worse on the left. He has a tense blister on the left anterior leg. Using his compression pumps usually once a day per his description. He does not come in with the compression stockings Objective Constitutional Patient is hypertensive.. Pulse regular and within target range for patient.[name] Respirations regular, non-labored and within target range.. Temperature is normal and within the target range for the patient.[name] appears in no distress. Vitals Time Taken: 12:37 PM, Height: 76 in, Weight: 395 lbs, Source: Measured,  BMI: 48.1, Temperature: 97.7 F, Pulse: 55 bpm, Respiratory Rate: 18 breaths/min, Blood Pressure: 188/67 mmHg. Eyes Conjunctivae clear. No discharge. Respiratory Respiratory effort is easy and symmetric bilaterally. Rate is normal at rest and on room air.. Bilateral breath sounds are clear and equal in all lobes with no wheezes, rales or rhonchi.. Cardiovascular Heart sounds are normal no murmurs JVP is however elevated.. Massive edema in the left leg much greater than the right. This is nonpitting there is no evidence of infection. His thighs are equal bilaterally no tenderness. Lymphatic Sellinger, Mcgregor A. ([id number]) None palpable in the popliteal area bilaterally. Psychiatric No evidence of depression, anxiety, or agitation. Calm, cooperative, and communicative. Appropriate interactions and affect.. Integumentary (Hair, Skin) Lymphedema bilaterally. No other skin lesions. Wound #2 status is Open. Original cause of wound was Gradually Appeared. The wound is located on the Left,Lateral,Posterior Lower Leg. The wound measures 0.1cm length x 0.1cm width x 0.1cm depth; 0.008cm^2 area and 0.001cm^3 volume. There is Fat Layer (Subcutaneous Tissue) Exposed exposed. There is  no tunneling or undermining noted. There is a small amount of serous drainage noted. The wound margin is indistinct and nonvisible. There is no granulation within the wound bed. There is a small (1-33%) amount of necrotic tissue within the wound bed including Adherent Slough. The periwound skin appearance did not exhibit: Callus, Crepitus, Excoriation, Induration, Rash, Scarring, Dry/Scaly, Maceration, Atrophie Blanche, Cyanosis, Ecchymosis, Hemosiderin Staining, Mottled, Pallor, Rubor, Erythema. Periwound temperature was noted as No Abnormality. Assessment Active Problems ICD-10 Non-pressure chronic ulcer of left calf with necrosis of muscle Lymphedema, not elsewhere classified Chronic venous hypertension (idiopathic) with ulcer and inflammation of left lower extremity Type 2 diabetes mellitus with other skin ulcer Non-pressure chronic ulcer of right calf limited to breakdown of skin Diagnoses ICD-10 L97.223: Non-pressure chronic ulcer of left calf with necrosis of muscle I89.0: Lymphedema, not elsewhere classified I87.332: Chronic venous hypertension (idiopathic) with ulcer and inflammation of left lower extremity E11.622: Type 2 diabetes mellitus with other skin ulcer L97.211: Non-pressure chronic ulcer of right calf limited to breakdown of skin Procedures Wound #2 Pre-procedure diagnosis of Wound #2 is a Diabetic Wound/Ulcer of the Lower Extremity located on the Left, Lateral, Posterior Lower Leg . Abscess incision and drainage was provided by Ricard Dillon, MD. The skin was cleansed and prepped with anti-septic followed by pain control using Lidocaine. An incision was made in the left lateral lower leg with the following instrument(s): Blade. There was an immediate release of Serous fluid. A Minimum amount of bleeding was controlled with Pressure. A time out was conducted at 12:55, prior to the start of the procedure. No culture was sent. The procedure was tolerated well with a  pain level of 0 throughout and a pain level of 0 following the procedure. Post procedure Diagnosis Wound #2: Same as Pre-Procedure Haupt, Geovannie A. ([id number]) Plan Wound Cleansing: Wound #2 Left,Lateral,Posterior Lower Leg: Clean wound with Normal Saline. Cleanse wound with mild soap and water Anesthetic (add to Medication List): Wound #2 Left,Lateral,Posterior Lower Leg: Topical Lidocaine 4% cream applied to wound bed prior to debridement (In Clinic Only). Primary Wound Dressing: Wound #2 Left,Lateral,Posterior Lower Leg: Silver Alginate - on all draining areas Secondary Dressing: Wound #2 Left,Lateral,Posterior Lower Leg: ABD pad Dressing Change Frequency: Wound #2 Left,Lateral,Posterior Lower Leg: Change dressing every week Follow-up Appointments: Wound #2 Left,Lateral,Posterior Lower Leg: Return Appointment in 1 week. Nurse Visit as needed - Friday Edema Control: Wound #2 Left,Lateral,Posterior Lower Leg: 4-Layer Compression System - Left Lower  Extremity. Patient to wear own compression stockings - Right Compression Pump: Use compression pump on left lower extremity for 30 minutes, twice daily. - 1 Hour three times daily Compression Pump: Use compression pump on right lower extremity for 30 minutes, twice daily. - 1 Hour three times daily #1 the tense blister on his anterior leg was unroofed with a #15 scalpel. Base of the wound cleaned out. 2. He has the divot in the left lateral calf. This is about the same as last time. As long as he has as much edema in his left leg as he has currently this is not going to stand a chance to heal. I was hoping that once he initiates dialysis we would see enough improvement in the overall swelling which I think is a combination of lymphedema and systemic fluid volume overload. This might allow this area to close. 3. Apparently dialysis is nowhere close. Going for revision of his shunt in his right arm by discussion with the patient 4. His  physical exam does not suggest any distress. He does have elevated jugular venous pressure Electronic Signature(s) Signed: 10/18/2018 6:18:32 PM By: Linton Ham MD Entered By: Linton Ham on 10/18/2018 13:24:49 Weisgerber, Javonta A. ([id number]) -------------------------------------------------------------------------------- SuperBill Details Patient Name: Arenas, Zadkiel A. Date of Service: 10/18/2018 Medical Record Number: [id number] Patient Account Number: [id number] Date of Birth/Sex: March 09, 1971 (48 y.o. M) Treating RN: Cornell Barman Primary Care Provider: Gaetano Net Other Clinician: Referring Provider: Gaetano Net Treating Provider/Extender: Tito Dine in Treatment: 42 Diagnosis Coding ICD-10 Codes Code Description [phone] Non-pressure chronic ulcer of left calf with necrosis of muscle I89.0 Lymphedema, not elsewhere classified I87.332 Chronic venous hypertension (idiopathic) with ulcer and inflammation of left lower extremity E11.622 Type 2 diabetes mellitus with other skin ulcer L97.211 Non-pressure chronic ulcer of right calf limited to breakdown of skin Facility Procedures CPT4 Code: [id number] Description: [phone] - WOUND CARE VISIT-LEV 2 EST PT Modifier: Quantity: 1 CPT4 Code: [id number] Description: 10060 - I and D Abscess; simple/single ICD-10 Diagnosis Description E11.622 Type 2 diabetes mellitus with other skin ulcer Modifier: Quantity: 1 Physician Procedures CPT4 Code: [id number] Description: 10060 - I and D Abscess; simple/single ICD-10 Diagnosis Description E11.622 Type 2 diabetes mellitus with other skin ulcer Modifier: Quantity: 1 Electronic Signature(s) Signed: 10/18/2018 6:18:32 PM By: Linton Ham MD Entered By: Linton Ham on 10/18/2018 13:25:19

## 2018-10-20 ENCOUNTER — Other Ambulatory Visit (INDEPENDENT_AMBULATORY_CARE_PROVIDER_SITE_OTHER): Payer: Self-pay | Admitting: Nurse Practitioner

## 2018-10-20 ENCOUNTER — Ambulatory Visit: Payer: BLUE CROSS/BLUE SHIELD

## 2018-10-24 ENCOUNTER — Encounter
Admission: RE | Disposition: A | Discharge status: Home / Self Care | Payer: Self-pay | Source: Home / Self Care | Attending: Vascular Surgery

## 2018-10-24 ENCOUNTER — Other Ambulatory Visit: Payer: Self-pay

## 2018-10-24 ENCOUNTER — Encounter: Payer: Self-pay | Admitting: Emergency Medicine

## 2018-10-24 ENCOUNTER — Ambulatory Visit
Admission: RE | Admit: 2018-10-24 | Discharge: 2018-10-24 | Disposition: A | Discharge status: Home / Self Care | Payer: BLUE CROSS/BLUE SHIELD | Attending: Vascular Surgery | Admitting: Vascular Surgery

## 2018-10-24 DIAGNOSIS — G4730 Sleep apnea, unspecified: Secondary | ICD-10-CM | POA: Diagnosis not present

## 2018-10-24 DIAGNOSIS — E1122 Type 2 diabetes mellitus with diabetic chronic kidney disease: Secondary | ICD-10-CM | POA: Insufficient documentation

## 2018-10-24 DIAGNOSIS — Y832 Surgical operation with anastomosis, bypass or graft as the cause of abnormal reaction of the patient, or of later complication, without mention of misadventure at the time of the procedure: Secondary | ICD-10-CM | POA: Diagnosis not present

## 2018-10-24 DIAGNOSIS — T82510A Breakdown (mechanical) of surgically created arteriovenous fistula, initial encounter: Secondary | ICD-10-CM | POA: Insufficient documentation

## 2018-10-24 DIAGNOSIS — T82898A Other specified complication of vascular prosthetic devices, implants and grafts, initial encounter: Secondary | ICD-10-CM

## 2018-10-24 DIAGNOSIS — Z794 Long term (current) use of insulin: Secondary | ICD-10-CM | POA: Diagnosis not present

## 2018-10-24 DIAGNOSIS — Z992 Dependence on renal dialysis: Secondary | ICD-10-CM | POA: Diagnosis not present

## 2018-10-24 DIAGNOSIS — Z79899 Other long term (current) drug therapy: Secondary | ICD-10-CM | POA: Diagnosis not present

## 2018-10-24 DIAGNOSIS — I120 Hypertensive chronic kidney disease with stage 5 chronic kidney disease or end stage renal disease: Secondary | ICD-10-CM | POA: Diagnosis not present

## 2018-10-24 DIAGNOSIS — I890 Lymphedema, not elsewhere classified: Secondary | ICD-10-CM | POA: Insufficient documentation

## 2018-10-24 DIAGNOSIS — E559 Vitamin D deficiency, unspecified: Secondary | ICD-10-CM | POA: Insufficient documentation

## 2018-10-24 DIAGNOSIS — N186 End stage renal disease: Secondary | ICD-10-CM | POA: Insufficient documentation

## 2018-10-24 HISTORY — PX: A/V FISTULAGRAM: CATH118298

## 2018-10-24 LAB — GLUCOSE, CAPILLARY
Glucose-Capillary: 122 mg/dL — ABNORMAL HIGH (ref 70–99)
Glucose-Capillary: 103 mg/dL — ABNORMAL HIGH (ref 70–99)

## 2018-10-24 LAB — POTASSIUM (ARMC VASCULAR LAB ONLY): Potassium (ARMC vascular lab): 4 (ref 3.5–5.1)

## 2018-10-24 SURGERY — A/V FISTULAGRAM
Anesthesia: Moderate Sedation | Laterality: Right

## 2018-10-24 MED ORDER — FAMOTIDINE 20 MG PO TABS: 40.0000 mg | ORAL_TABLET | Freq: Once | ORAL | Status: DC | PRN

## 2018-10-24 MED ORDER — FENTANYL CITRATE (PF) 100 MCG/2ML IJ SOLN
INTRAMUSCULAR | Status: DC | PRN
  Administered 2018-10-24: 50 ug via INTRAVENOUS

## 2018-10-24 MED ORDER — MIDAZOLAM HCL 2 MG/ML PO SYRP
8.0000 mg | ORAL_SOLUTION | Freq: Once | ORAL | Status: AC | PRN
  Administered 2018-10-24: 8 mg via ORAL

## 2018-10-24 MED ORDER — HEPARIN SODIUM (PORCINE) 1000 UNIT/ML IJ SOLN
INTRAMUSCULAR | Status: AC
  Filled 2018-10-24: qty 1

## 2018-10-24 MED ORDER — DIPHENHYDRAMINE HCL 50 MG/ML IJ SOLN: 50.0000 mg | Freq: Once | INTRAMUSCULAR | Status: DC | PRN

## 2018-10-24 MED ORDER — LIDOCAINE HCL (PF) 1 % IJ SOLN
INTRAMUSCULAR | Status: AC
  Filled 2018-10-24: qty 30

## 2018-10-24 MED ORDER — METHYLPREDNISOLONE SODIUM SUCC 125 MG IJ SOLR: 125.0000 mg | Freq: Once | INTRAMUSCULAR | Status: DC | PRN

## 2018-10-24 MED ORDER — FENTANYL CITRATE (PF) 100 MCG/2ML IJ SOLN
INTRAMUSCULAR | Status: AC
  Filled 2018-10-24: qty 2

## 2018-10-24 MED ORDER — SODIUM CHLORIDE FLUSH 0.9 % IV SOLN
INTRAVENOUS | Status: AC
  Filled 2018-10-24: qty 10

## 2018-10-24 MED ORDER — HYDROMORPHONE HCL 1 MG/ML IJ SOLN: 1.0000 mg | Freq: Once | INTRAMUSCULAR | Status: DC | PRN

## 2018-10-24 MED ORDER — SODIUM CHLORIDE 0.9 % IV SOLN
INTRAVENOUS | Status: DC
  Administered 2018-10-24: 07:00:00 via INTRAVENOUS

## 2018-10-24 MED ORDER — MIDAZOLAM HCL 2 MG/ML PO SYRP
ORAL_SOLUTION | ORAL | Status: AC
  Filled 2018-10-24: qty 4

## 2018-10-24 MED ORDER — MIDAZOLAM HCL 5 MG/5ML IJ SOLN
INTRAMUSCULAR | Status: AC
  Filled 2018-10-24: qty 5

## 2018-10-24 MED ORDER — HEPARIN SODIUM (PORCINE) 1000 UNIT/ML IJ SOLN
INTRAMUSCULAR | Status: DC | PRN
  Administered 2018-10-24: 3000 [IU] via INTRAVENOUS

## 2018-10-24 MED ORDER — HEPARIN (PORCINE) IN NACL 1000-0.9 UT/500ML-% IV SOLN
INTRAVENOUS | Status: AC
  Filled 2018-10-24: qty 1000

## 2018-10-24 MED ORDER — CEFAZOLIN SODIUM-DEXTROSE 1-4 GM/50ML-% IV SOLN
1.0000 g | Freq: Once | INTRAVENOUS | Status: AC
  Administered 2018-10-24: 1 g via INTRAVENOUS

## 2018-10-24 MED ORDER — MIDAZOLAM HCL 2 MG/2ML IJ SOLN
INTRAMUSCULAR | Status: DC | PRN
  Administered 2018-10-24 (×2): 1 mg via INTRAVENOUS

## 2018-10-24 MED ORDER — IOPAMIDOL (ISOVUE-300) INJECTION 61%
INTRAVENOUS | Status: DC | PRN
  Administered 2018-10-24: 40 mL via INTRAVENOUS

## 2018-10-24 MED ORDER — ONDANSETRON HCL 4 MG/2ML IJ SOLN: 4.0000 mg | Freq: Four times a day (QID) | INTRAMUSCULAR | Status: DC | PRN

## 2018-10-24 SURGICAL SUPPLY — 11 items
CANNULA 5F STIFF (CANNULA) ×2 IMPLANT
CATH BEACON 5 .035 40 KMP TP (CATHETERS) IMPLANT
CATH BEACON 5 .038 40 KMP TP (CATHETERS) ×2
DEVICE RAD COMP TR BAND LRG (VASCULAR PRODUCTS) ×2 IMPLANT
DEVICE TORQUE .025-.038 (MISCELLANEOUS) ×2 IMPLANT
GLIDEWIRE ADV .035X180CM (WIRE) ×2 IMPLANT
GUIDEWIRE ANGLED .035 180CM (WIRE) ×2 IMPLANT
PACK ANGIOGRAPHY (CUSTOM PROCEDURE TRAY) ×3 IMPLANT
SHEATH BRITE TIP 4FRX11 (SHEATH) ×2 IMPLANT
SHEATH BRITE TIP 5FRX11 (SHEATH) ×4 IMPLANT
SHEATH BRITE TIP 6FRX5.5 (SHEATH) ×2 IMPLANT

## 2018-10-24 NOTE — Progress Notes (Signed)
YUREM, VINER ([id number]) Visit Report for 10/18/2018 Arrival Information Details Patient Name: [name], [name] Date of Service: 10/18/2018 12:30 PM Medical Record Number: [id number] Patient Account Number: [id number] Date of Birth/Sex: December 24, 1970 (48 y.o. M) Treating RN: [name] Primary Care [name]: [name] Other Clinician: Referring [name]: [name] Treating [name]/Extender: [name] in Treatment: 47 Visit Information History Since Last Visit Added or deleted any medications: No Patient Arrived: Ambulatory Any new allergies or adverse reactions: No Arrival Time: 12:36 Had a fall or experienced change in No Accompanied By: self activities of daily living that may affect Transfer Assistance: None risk of falls: Patient Identification Verified: Yes Signs or symptoms of abuse/neglect since last visito No Secondary Verification Process Yes Hospitalized since last visit: No Completed: Implantable device outside of the clinic excluding No Patient Requires Transmission-Based No cellular tissue based products placed in the center Precautions: since last visit: Patient Has Alerts: Yes Has Dressing in Place as Prescribed: Yes Patient Alerts: DM II Pain Present Now: No noncompressible bilateral Electronic Signature(s) Signed: 10/18/2018 2:49:55 PM By: [name] RCP, RRT, CHT Entered By: [name] on 10/18/2018 12:37:34 [name], [name] -------------------------------------------------------------------------------- Clinic Level of Care Assessment Details Patient Name: [name], [name] Date of Service: 10/18/2018 12:30 PM Medical Record Number: [id number] Patient Account Number: [id number] Date of Birth/Sex: [birth year] (48 y.o. M) Treating RN: [name] Primary Care Ayani Ospina: [name] Other Clinician: Referring Vencil Basnett: [name] Treating Sanvi Ehler/Extender: [name] in  Treatment: 95 Clinic Level of Care Assessment Items TOOL 3 Quantity Score []  - Use when EandM and Procedure is performed on FOLLOW-UP visit 0 ASSESSMENTS - Nursing Assessment / Reassessment X - Reassessment of Co-morbidities (includes updates in patient status) 1 10 X- 1 5 Reassessment of Adherence to Treatment Plan ASSESSMENTS - Wound and Skin Assessment / Reassessment []  - Points for Wound Assessment can only be taken for a new wound of unknown or different 0 etiology and a procedure is NOT performed to that wound X- 1 5 Simple Wound Assessment / Reassessment - one wound []  - 0 Complex Wound Assessment / Reassessment - multiple wounds []  - 0 Dermatologic / Skin Assessment (not related to wound area) ASSESSMENTS - Focused Assessment []  - Circumferential Edema Measurements - multi extremities 0 []  - 0 Nutritional Assessment / Counseling / Intervention []  - 0 Lower Extremity Assessment (monofilament, tuning fork, pulses) []  - 0 Peripheral Arterial Disease Assessment (using hand held doppler) ASSESSMENTS - Ostomy and/or Continence Assessment and Care []  - Incontinence Assessment and Management 0 []  - 0 Ostomy Care Assessment and Management (repouching, etc.) PROCESS - Coordination of Care []  - Points for Discharge Coordination can only be taken for a new wound of unknown or different 0 etiology and a procedure is NOT performed to that wound X- 1 15 Simple Patient / Family Education for ongoing care []  - 0 Complex (extensive) Patient / Family Education for ongoing care []  - 0 Staff obtains Programmer, systems, Records, Test Results / Process Orders []  - 0 Staff telephones HHA, Nursing Homes / Clarify orders / etc []  - 0 Routine Transfer to another Facility (non-emergent condition) []  - 0 Routine Hospital Admission (non-emergent condition) [name], [name] []  - 0 New Admissions / Biomedical engineer / Ordering NPWT, Apligraf, etc. []  - 0 Emergency Hospital Admission  (emergent condition) X- 1 10 Simple Discharge Coordination []  - 0 Complex (extensive) Discharge Coordination PROCESS - Special Needs []  - Pediatric / Minor Patient Management  0 []  - 0 Isolation Patient Management []  - 0 Hearing / Language / Visual special needs []  - 0 Assessment of Community assistance (transportation, D/C planning, etc.) []  - 0 Additional assistance / Altered mentation []  - 0 Support Surface(s) Assessment (bed, cushion, seat, etc.) INTERVENTIONS - Wound Cleansing / Measurement []  - Points for Wound Cleaning / Measurement, Wound Dressing, Specimen Collection and 0 Specimen taken to lab can only be taken for a new wound of unknown or different etiology and a procedure is NOT performed to that wound X- 1 5 Simple Wound Cleansing - one wound []  - 0 Complex Wound Cleansing - multiple wounds X- 1 5 Wound Imaging (photographs - any number of wounds) []  - 0 Wound Tracing (instead of photographs) X- 1 5 Simple Wound Measurement - one wound []  - 0 Complex Wound Measurement - multiple wounds INTERVENTIONS - Wound Dressings []  - Small Wound Dressing one or multiple wounds 0 []  - 0 Medium Wound Dressing one or multiple wounds []  - 0 Large Wound Dressing one or multiple wounds INTERVENTIONS - Miscellaneous []  - External ear exam 0 []  - 0 Specimen Collection (cultures, biopsies, blood, body fluids, etc.) []  - 0 Specimen(s) / Culture(s) sent or taken to Lab for analysis []  - 0 Patient Transfer (multiple staff / Civil Service fast streamer / Similar devices) []  - 0 Simple Staple / Suture removal (25 or less) []  - 0 Complex Staple / Suture removal (26 or more) [name], [name] []  - 0 Hypo / Hyperglycemic Management (close monitor of Blood Glucose) []  - 0 Ankle / Brachial Index (ABI) - do not check if billed separately X- 1 5 Vital Signs Has the patient been seen at the hospital within the last three years: Yes Total Score: 65 Level Of Care: New/Established -  Level 2 Notes New wound on left leg. Electronic Signature(s) Signed: 10/18/2018 6:42:35 PM By: [name], BSN, RN, CWS, Kim RN, BSN Entered By: [name], BSN, RN, CWS, [name] on 10/18/2018 13:05:58 [name], [name] ([id number]) -------------------------------------------------------------------------------- Encounter Discharge Information Details Patient Name: Dusza, Muneeb A. Date of Service: 10/18/2018 12:30 PM Medical Record Number: [id number] Patient Account Number: [id number] Date of Birth/Sex: October 12, 1970 (48 y.o. M) Treating RN: Harold Barban Primary Care Annett Boxwell: [name] Other Clinician: Referring Berlie Persky: [name] Treating Cecely Rengel/Extender: [name] in Treatment: 57 Encounter Discharge Information Items Post Procedure Vitals Discharge Condition: Stable Temperature (F): 97.7 Ambulatory Status: Ambulatory Pulse (bpm): 55 Discharge Destination: Home Respiratory Rate (breaths/min): 18 Transportation: Private Auto Blood Pressure (mmHg): 188/67 Accompanied By: self Schedule Follow-up Appointment: Yes Clinical Summary of Care: Electronic Signature(s) Signed: 10/19/2018 8:29:02 AM By: Harold Barban Entered By: Harold Barban on 10/19/2018 08:29:02 [name], [place]. ([id number]) -------------------------------------------------------------------------------- Lower Extremity Assessment Details Patient Name: [name], [name] Date of Service: 10/18/2018 12:30 PM Medical Record Number: [id number] Patient Account Number: [id number] Date of Birth/Sex: Jan 12, 1971 (47 y.o. M) Treating RN: Army Melia Primary Care Mahdiya Mossberg: [name] Other Clinician: Referring Kristoff Coonradt: [name] Treating Laquincy Eastridge/Extender: [name] in Treatment: 42 Edema Assessment Assessed: [Left: No] [Right: No] Edema: [Left: Yes] [Right: Yes] Calf Left: Right: Point of Measurement: 36 cm From Medial Instep 67 cm 59 cm Ankle Left: Right: Point of Measurement: 14 cm From  Medial Instep 53 cm 47 cm Vascular Assessment Pulses: Dorsalis Pedis Palpable: [Left:No] [Right:No] Posterior Tibial Extremity colors, hair growth, and conditions: Extremity Color: [Left:Hyperpigmented] [Right:Hyperpigmented] Hair Growth on Extremity: [Left:Yes] [Right:Yes] Temperature of Extremity: [Left:Warm] [Right:Warm] Capillary Refill: [Left:< 3 seconds] [Right:< 3 seconds]  Toe Nail Assessment Left: Right: Thick: Yes Yes Discolored: Yes Yes Deformed: No No Improper Length and Hygiene: No No Electronic Signature(s) Signed: 10/20/2018 4:09:31 PM By: Army Melia Entered By: Army Melia on 10/18/2018 12:46:02 Muenchow, Austin A. ([id number]) -------------------------------------------------------------------------------- Multi Wound Chart Details Patient Name: [name], [name] Date of Service: 10/18/2018 12:30 PM Medical Record Number: [id number] Patient Account Number: [id number] Date of Birth/Sex: [birth year] (48 y.o. M) Treating RN: [name] Primary Care Jami Ohlin: [name] Other Clinician: Referring Gabrian Hoque: [name] Treating Tericka Devincenzi/Extender: [name] in Treatment: 42 Vital Signs Height(in): 76 Pulse(bpm): 55 Weight(lbs): 395 Blood Pressure(mmHg): 188/67 Body Mass Index(BMI): 48 Temperature(F): 97.7 Respiratory Rate 18 (breaths/min): Photos: [2:No Photos] [N/A:N/A] Wound Location: [2:Left Lower Leg - Lateral, Posterior] [N/A:N/A] Wounding Event: [2:Gradually Appeared] [N/A:N/A] Primary Etiology: [2:Diabetic Wound/Ulcer of the Lower Extremity] [N/A:N/A] Secondary Etiology: [2:Lymphedema] [N/A:N/A] Comorbid History: [2:Hypertension, Type II Diabetes, Neuropathy] [N/A:N/A] Date Acquired: [2:09/30/2017] [N/A:N/A] Weeks of Treatment: [2:42] [N/A:N/A] Wound Status: [2:Open] [N/A:N/A] Measurements L x W x D [2:0.1x0.1x0.1] [N/A:N/A] (cm) Area (cm) : [2:0.008] [N/A:N/A] Volume (cm) : [2:0.001] [N/A:N/A] % Reduction in Area: [2:100.00%]  [N/A:N/A] % Reduction in Volume: [2:100.00%] [N/A:N/A] Classification: [2:Grade 2] [N/A:N/A] Exudate Amount: [2:Small] [N/A:N/A] Exudate Type: [2:Serous] [N/A:N/A] Exudate Color: [2:amber] [N/A:N/A] Wound Margin: [2:Indistinct, nonvisible] [N/A:N/A] Granulation Amount: [2:None Present (0%)] [N/A:N/A] Necrotic Amount: [2:Small (1-33%)] [N/A:N/A] Exposed Structures: [2:Fat Layer (Subcutaneous Tissue) Exposed: Yes Fascia: No Tendon: No Muscle: No Joint: No Bone: No] [N/A:N/A] Epithelialization: [2:None] [N/A:N/A] Periwound Skin Texture: [2:Excoriation: No Induration: No Callus: No Crepitus: No] [N/A:N/A] Rash: No Scarring: No Periwound Skin Moisture: Maceration: No N/A N/A Dry/Scaly: No Periwound Skin Color: Atrophie Blanche: No N/A N/A Cyanosis: No Ecchymosis: No Erythema: No Hemosiderin Staining: No Mottled: No Pallor: No Rubor: No Temperature: No Abnormality N/A N/A Tenderness on Palpation: No N/A N/A Wound Preparation: Ulcer Cleansing: N/A N/A Rinsed/Irrigated with Saline Topical Anesthetic Applied: Other: lidocaine 4% Procedures Performed: Incision and Drainage N/A N/A Treatment Notes Electronic Signature(s) Signed: 10/18/2018 6:18:32 PM By: Linton Ham MD Entered By: Linton Ham on 10/18/2018 13:18:23 [name], [name] Kitchen ([id number]) -------------------------------------------------------------------------------- Multi-Disciplinary Care Plan Details Patient Name: Sees, Raylee A. Date of Service: 10/18/2018 12:30 PM Medical Record Number: [id number] Patient Account Number: [id number] Date of Birth/Sex: 07/01/1971 (47 y.o. M) Treating RN: [name] Primary Care Tiffiney Sparrow: [name] Other Clinician: Referring Jemario Poitras: [name] Treating Jillayne Witte/Extender: [name] in Treatment: 19 Active Inactive Abuse / Safety / Falls / Self Care Management Nursing Diagnoses: Potential for falls Goals: Patient will remain injury free related to  falls Date Initiated: 12/28/2017 Target Resolution Date: 04/08/2018 Goal Status: Active Interventions: Assess fall risk on admission and as needed Notes: Nutrition Nursing Diagnoses: Impaired glucose control: actual or potential Goals: Patient/caregiver verbalizes understanding of need to maintain therapeutic glucose control per primary care physician Date Initiated: 12/28/2017 Target Resolution Date: 04/08/2018 Goal Status: Active Interventions: Provide education on elevated blood sugars and impact on wound healing Notes: Orientation to the Wound Care Program Nursing Diagnoses: Knowledge deficit related to the wound healing center program Goals: Patient/caregiver will verbalize understanding of the [facility] Date Initiated: 12/28/2017 Target Resolution Date: 04/08/2018 Goal Status: Active Interventions: Provide education on orientation to the wound center Mcpeek, Elwin A. ([id number]) Notes: Venous Leg Ulcer Nursing Diagnoses: Knowledge deficit related to disease process and management Goals: Patient will maintain optimal edema control Date Initiated: 03/27/2018 Target Resolution Date: 04/27/2018 Goal Status: Active Interventions: Assess peripheral edema status every visit. Compression as ordered Treatment Activities:  Therapeutic compression applied : 03/22/2018 Notes: Wound/Skin Impairment Nursing Diagnoses: Impaired tissue integrity Goals: Ulcer/skin breakdown will heal within 14 weeks Date Initiated: 12/28/2017 Target Resolution Date: 04/08/2018 Goal Status: Active Interventions: Assess patient/caregiver ability to obtain necessary supplies Assess patient/caregiver ability to perform ulcer/skin care regimen upon admission and as needed Assess ulceration(s) every visit Notes: Electronic Signature(s) Signed: 10/18/2018 6:42:35 PM By: [name], BSN, RN, CWS, Kim RN, BSN Entered By: [name], BSN, RN, CWS, [name] on 10/18/2018 12:58:51 [name], [name]  ([id number]) -------------------------------------------------------------------------------- Pain Assessment Details Patient Name: Belson, Zanden A. Date of Service: 10/18/2018 12:30 PM Medical Record Number: [id number] Patient Account Number: [id number] Date of Birth/Sex: [birth year] (48 y.o. M) Treating RN: [name] Primary Care Lalena Salas: [name] Other Clinician: Referring Copper Basnett: [name] Treating Marcellis Frampton/Extender: [name] in Treatment: 44 Active Problems Location of Pain Severity and Description of Pain Patient Has Paino No Site Locations Pain Management and Medication Current Pain Management: Electronic Signature(s) Signed: 10/18/2018 2:49:55 PM By: [name] RCP, RRT, CHT Signed: 10/18/2018 6:42:35 PM By: [name], BSN, RN, CWS, Kim RN, BSN Entered By: [name] on 10/18/2018 12:37:42 [name], [name] ([id number]) -------------------------------------------------------------------------------- Patient/Caregiver Education Details Patient Name: [name], [name] Date of Service: 10/18/2018 12:30 PM Medical Record Number: [id number] Patient Account Number: [id number] Date of Birth/Gender: 1970/11/16 (48 y.o. M) Treating RN: [name] Primary Care Physician: [name] Other Clinician: Referring Physician: Gaetano [name] Treating Physician/Extender: [name] in Treatment: 33 Education Assessment Education Provided To: Patient Education Topics Provided Venous: Handouts: Controlling Swelling with Compression Stockings Methods: Demonstration, Explain/Verbal Responses: State content correctly Wound/Skin Impairment: Handouts: Caring for Your Ulcer Methods: Demonstration, Explain/Verbal Responses: State content correctly Electronic Signature(s) Signed: 10/24/2018 11:13:25 AM By: Harold Barban Previous Signature: 10/18/2018 6:42:35 PM Version By: [name], BSN, RN, CWS, Kim RN, BSN Entered By: Harold Barban on 10/19/2018 08:29:38 [name], [name] ([id number]) -------------------------------------------------------------------------------- Wound Assessment Details Patient Name: [name], [name] Date of Service: 10/18/2018 12:30 PM Medical Record Number: [id number] Patient Account Number: [id number] Date of Birth/Sex: 04-26-1971 (48 y.o. M) Treating RN: Army Melia Primary Care Ja Ohman: [name] Other Clinician: Referring Laquonda Welby: [name] Treating Nylene Inlow/Extender: [name] in Treatment: 42 Wound Status Wound Number: 2 Primary Etiology: Diabetic Wound/Ulcer of the Lower Extremity Wound Location: Left Lower Leg - Lateral, Posterior Secondary Lymphedema Wounding Event: Gradually Appeared Etiology: Date Acquired: 09/30/2017 Wound Status: Open Weeks Of Treatment: 42 Comorbid History: Hypertension, Type II Diabetes, Clustered Wound: No Neuropathy Wound Measurements Length: (cm) 0.1 Width: (cm) 0.1 Depth: (cm) 0.1 Area: (cm) 0.008 Volume: (cm) 0.001 % Reduction in Area: 100% % Reduction in Volume: 100% Epithelialization: None Tunneling: No Undermining: No Wound Description Classification: Grade 2 Wound Margin: Indistinct, nonvisible Exudate Amount: Small Exudate Type: Serous Exudate Color: amber Foul Odor After Cleansing: No Slough/Fibrino Yes Wound Bed Granulation Amount: None Present (0%) Exposed Structure Necrotic Amount: Small (1-33%) Fascia Exposed: No Necrotic Quality: Adherent Slough Fat Layer (Subcutaneous Tissue) Exposed: Yes Tendon Exposed: No Muscle Exposed: No Joint Exposed: No Bone Exposed: No Periwound Skin Texture Texture Color No Abnormalities Noted: No No Abnormalities Noted: No Callus: No Atrophie Blanche: No Crepitus: No Cyanosis: No Excoriation: No Ecchymosis: No Induration: No Erythema: No Rash: No Hemosiderin Staining: No Scarring: No Mottled: No Pallor: No Moisture Rubor: No No Abnormalities  Noted: No Dry / Scaly: No Temperature / Pain Maceration: No Temperature: No Abnormality [name], [name] ([id number]) Wound Preparation Ulcer Cleansing: Rinsed/Irrigated with Saline Topical Anesthetic Applied: Other:  lidocaine 4%, Treatment Notes Wound #2 (Left, Lateral, Posterior Lower Leg) Notes Silvercell,gauze, conform and tape Electronic Signature(s) Signed: 10/20/2018 4:09:31 PM By: Army Melia Entered By: Army Melia on 10/18/2018 12:43:36 Levan, Malakhai A. ([id number]) -------------------------------------------------------------------------------- Vitals Details Patient Name: [name], Mycah A. Date of Service: 10/18/2018 12:30 PM Medical Record Number: [id number] Patient Account Number: [id number] Date of Birth/Sex: 1970-10-15 (48 y.o. M) Treating RN: [name] Primary Care Hong Moring: [name] Other Clinician: Referring Alnita Aybar: [name] Treating Rachyl Wuebker/Extender: [name] in Treatment: 67 Vital Signs Time Taken: 12:37 Temperature (F): 97.7 Height (in): 76 Pulse (bpm): 55 Weight (lbs): 395 Respiratory Rate (breaths/min): 18 Source: Measured Blood Pressure (mmHg): 188/67 Body Mass Index (BMI): 48.1 Reference Range: 80 - 120 mg / dl Electronic Signature(s) Signed: 10/18/2018 2:49:55 PM By: [name] RCP, RRT, CHT Entered By: Becky Sax, Amado Nash on 10/18/2018 12:38:32

## 2018-10-24 NOTE — Discharge Instructions (Signed)
Radial Site Care ° °This sheet gives you information about how to care for yourself after your procedure. Your health care provider may also give you more specific instructions. If you have problems or questions, contact your health care provider. °What can I expect after the procedure? °After the procedure, it is common to have: °· Bruising and tenderness at the catheter insertion area. °Follow these instructions at home: °Medicines °· Take over-the-counter and prescription medicines only as told by your health care provider. °Insertion site care °· Follow instructions from your health care provider about how to take care of your insertion site. Make sure you: °? Wash your hands with soap and water before you change your bandage (dressing). If soap and water are not available, use hand sanitizer. °? Change your dressing as told by your health care provider. °? Leave stitches (sutures), skin glue, or adhesive strips in place. These skin closures may need to stay in place for 2 weeks or longer. If adhesive strip edges start to loosen and curl up, you may trim the loose edges. Do not remove adhesive strips completely unless your health care provider tells you to do that. °· Check your insertion site every day for signs of infection. Check for: °? Redness, swelling, or pain. °? Fluid or blood. °? Pus or a bad smell. °? Warmth. °· Do not take baths, swim, or use a hot tub until your health care provider approves. °· You may shower 24-48 hours after the procedure, or as directed by your health care provider. °? Remove the dressing and gently wash the site with plain soap and water. °? Pat the area dry with a clean towel. °? Do not rub the site. That could cause bleeding. °· Do not apply powder or lotion to the site. °Activity ° °· For 24 hours after the procedure, or as directed by your health care provider: °? Do not flex or bend the affected arm. °? Do not push or pull heavy objects with the affected arm. °? Do not  drive yourself home from the hospital or clinic. You may drive 24 hours after the procedure unless your health care provider tells you not to. °? Do not operate machinery or power tools. °· Do not lift anything that is heavier than 10 lb (4.5 kg), or the limit that you are told, until your health care provider says that it is safe. °· Ask your health care provider when it is okay to: °? Return to work or school. °? Resume usual physical activities or sports. °? Resume sexual activity. °General instructions °· If the catheter site starts to bleed, raise your arm and put firm pressure on the site. If the bleeding does not stop, get help right away. This is a medical emergency. °· If you went home on the same day as your procedure, a responsible adult should be with you for the first 24 hours after you arrive home. °· Keep all follow-up visits as told by your health care provider. This is important. °Contact a health care provider if: °· You have a fever. °· You have redness, swelling, or yellow drainage around your insertion site. °Get help right away if: °· You have unusual pain at the radial site. °· The catheter insertion area swells very fast. °· The insertion area is bleeding, and the bleeding does not stop when you hold steady pressure on the area. °· Your arm or hand becomes pale, cool, tingly, or numb. °These symptoms may represent a serious problem   that is an emergency. Do not wait to see if the symptoms will go away. Get medical help right away. Call your local emergency services (911 in the [place] Do not drive yourself to the hospital. Summary  After the procedure, it is common to have bruising and tenderness at the site.  Follow instructions from your health care provider about how to take care of your radial site wound. Check the wound every day for signs of infection.  Do not lift anything that is heavier than 10 lb (4.5 kg), or the limit that you are told, until your health care provider says  that it is safe. This information is not intended to replace advice given to you by your health care provider. Make sure you discuss any questions you have with your health care provider. Document Released: 09/18/2010 Document Revised: 09/21/2017 Document Reviewed: 09/21/2017 Elsevier Interactive Patient Education  2019 [place]. Dialysis Fistulogram, Care After This sheet gives you information about how to care for yourself after your procedure. Your health care provider may also give you more specific instructions. If you have problems or questions, contact your health care provider. What can I expect after the procedure? After the procedure, it is common to have:  A small amount of discomfort in the area where the small, thin tube (catheter) was placed for the procedure.  A small amount of bruising around the fistula.  Sleepiness and tiredness (fatigue). Follow these instructions at home: Activity   Rest at home and do not lift anything that is heavier than 5 lb (2.3 kg) on the day after your procedure.  Return to your normal activities as told by your health care provider. Ask your health care provider what activities are safe for you.  Do not drive or use heavy machinery while taking prescription pain medicine.  Do not drive for 24 hours if you were given a medicine to help you relax (sedative) during your procedure. Medicines   Take over-the-counter and prescription medicines only as told by your health care provider. Puncture site care  Follow instructions from your health care provider about how to take care of the site where catheters were inserted. Make sure you: ? Wash your hands with soap and water before you change your bandage (dressing). If soap and water are not available, use hand sanitizer. ? Change your dressing as told by your health care provider. ? Leave stitches (sutures), skin glue, or adhesive strips in place. These skin closures may need to stay in  place for 2 weeks or longer. If adhesive strip edges start to loosen and curl up, you may trim the loose edges. Do not remove adhesive strips completely unless your health care provider tells you to do that.  Check your puncture area every day for signs of infection. Check for: ? Redness, swelling, or pain. ? Fluid or blood. ? Warmth. ? Pus or a bad smell. General instructions  Do not take baths, swim, or use a hot tub until your health care provider approves. Ask your health care provider if you may take showers. You may only be allowed to take sponge baths.  Monitor your dialysis fistula closely. Check to make sure that you can feel a vibration or buzz (a thrill) when you put your fingers over the fistula.  Prevent damage to your graft or fistula: ? Do not wear tight-fitting clothing or jewelry on the arm or leg that has your graft or fistula. ? Tell all your health care providers that you  have a dialysis fistula or graft. ? Do not allow blood draws, IVs, or blood pressure readings to be done in the arm that has your fistula or graft. ? Do not allow flu shots or vaccinations in the arm with your fistula or graft.  Keep all follow-up visits as told by your health care provider. This is important. Contact a health care provider if:  You have redness, swelling, or pain at the site where the catheter was put in.  You have fluid or blood coming from the catheter site.  The catheter site feels warm to the touch.  You have pus or a bad smell coming from the catheter site.  You have a fever or chills. Get help right away if:  You feel weak.  You have trouble balancing.  You have trouble moving your arms or legs.  You have problems with your speech or vision.  You can no longer feel a vibration or buzz when you put your fingers over your dialysis fistula.  The limb that was used for the procedure: ? Swells. ? Is painful. ? Is cold. ? Is discolored, such as blue or pale  white.  You have chest pain or shortness of breath. Summary  After a dialysis fistulogram, it is common to have a small amount of discomfort or bruising in the area where the small, thin tube (catheter) was placed.  Rest at home on the day after your procedure. Return to your normal activities as told by your health care provider.  Take over-the-counter and prescription medicines only as told by your health care provider.  Follow instructions from your health care provider about how to take care of the site where the catheter was inserted.  Keep all follow-up visits as told by your health care provider. This information is not intended to replace advice given to you by your health care provider. Make sure you discuss any questions you have with your health care provider. Document Released: 12/31/2013 Document Revised: 09/16/2017 Document Reviewed: 09/16/2017 Elsevier Interactive Patient Education  2019 [place]. Moderate Conscious Sedation, Adult, Care After These instructions provide you with information about caring for yourself after your procedure. Your health care provider may also give you more specific instructions. Your treatment has been planned according to current medical practices, but problems sometimes occur. Call your health care provider if you have any problems or questions after your procedure. What can I expect after the procedure? After your procedure, it is common:  To feel sleepy for several hours.  To feel clumsy and have poor balance for several hours.  To have poor judgment for several hours.  To vomit if you eat too soon. Follow these instructions at home: For at least 24 hours after the procedure:   Do not: ? Participate in activities where you could fall or become injured. ? Drive. ? Use heavy machinery. ? Drink alcohol. ? Take sleeping pills or medicines that cause drowsiness. ? Make important decisions or sign legal documents. ? Take care of  children on your own.  Rest. Eating and drinking  Follow the diet recommended by your health care provider.  If you vomit: ? Drink water, juice, or soup when you can drink without vomiting. ? Make sure you have little or no nausea before eating solid foods. General instructions  Have a responsible adult stay with you until you are awake and alert.  Take over-the-counter and prescription medicines only as told by your health care provider.  If you smoke,  do not smoke without supervision.  Keep all follow-up visits as told by your health care provider. This is important. Contact a health care provider if:  You keep feeling nauseous or you keep vomiting.  You feel light-headed.  You develop a rash.  You have a fever. Get help right away if:  You have trouble breathing. This information is not intended to replace advice given to you by your health care provider. Make sure you discuss any questions you have with your health care provider. Document Released: 06/06/2013 Document Revised: 01/19/2016 Document Reviewed: 12/06/2015 Elsevier Interactive Patient Education  2019 [employer].

## 2018-10-24 NOTE — Op Note (Signed)
OPERATIVE NOTE   PROCEDURE: 1.  Ultrasound-guided access to right arm AV fistula 2.  Contrast injection right arm AV fistula 3.  Ultrasound-guided access to right ulnar artery at the wrist 4.  Right arm arteriogram  PRE-OPERATIVE DIAGNOSIS: Complication of dialysis access                                                       End Stage Renal Disease  POST-OPERATIVE DIAGNOSIS: same as above   SURGEON: Katha Cabal, M.D.  ANESTHESIA: Conscious sedation was administered under my direct supervision by the interventional radiology RN.  IV Versed plus fentanyl were utilized. Continuous ECG, pulse oximetry and blood pressure was monitored throughout the entire procedure.  Conscious sedation was for a total of 62 minutes.  ESTIMATED BLOOD LOSS: minimal  FINDING(S): 1. Fistula is patent there is direct filling of the antecubital crossing vein and the cephalic vein which measures approximately 8 mm in diameter and is approximately 6 to 10 mm in depth.  There is also filling of the basilic vein however this perforator is located in the forearm and appears to have delayed filling compared to the cephalic.  This is not a situation where an antecubital crossing vein is connecting the cephalic and the basilic.  Basilic remains more moderate in size.  SPECIMEN(S):  None  CONTRAST: 40 cc  FLUOROSCOPY TIME: 8.6 minutes  INDICATIONS: [name] is a 48 y.o. male who  presents with malfunctioning right arm AV access.  The patient is scheduled for angiography with possible intervention of the AV access to prevent loss of the permanent access.  The patient is aware the risks include but are not limited to: bleeding, infection, thrombosis of the cannulated access, and possible anaphylactic reaction to the contrast.  The patient acknowledges if the access can not be salvaged a tunneled catheter will be needed and will be placed during this procedure.  The patient is aware of the risks of the  procedure and elects to proceed with the angiogram and intervention.  DESCRIPTION: After full informed written consent was obtained, the patient was brought back to the Special Procedure suite and placed supine position.  Appropriate cardiopulmonary monitors were placed.  The right arm was prepped and draped in the standard fashion.  Appropriate timeout is called.   The right arm access was mapped with ultrasound.  The anastomosis can be readily identified and then followed through the perforator into the antecubital crossing vein and then the cephalic vein.  The cephalic vein was cannulated with a micropuncture needle under ultrasound guidence in a retrograde direction in the proximal upper arm.  Ultrasound was used to evaluate the cephalic vein access.  It was echolucent and compressible indicating it is patent .  An ultrasound image was acquired for the permanent record.  A micropuncture needle was used to access the cephalic vein access under direct ultrasound guidance.  The microwire was then advanced under fluoroscopic guidance without difficulty followed by the micro-sheath.  The J-wire was then advanced and a 5 Fr sheath inserted.  Hand injections were completed to image the access from the arterial anastomosis through the entire access.    Based on the images, the anastomosis is patent as noted above in the findings section.  The cephalic vein appears to be maturing and is quite nice.  It is straight it is fairly large.  It is of moderate depth.  Of note there are extensive venous communications at the level of the anastomosis which by angiography completely obscures the actual arterial venous connection.  Because of this it is necessary to access from the arterial direction in the hopes of better defining the actual anastomosis and the venous outflow pattern.  Therefore, the ultrasound was returned to the sterile field and the ulnar artery at the wrist was identified.  It is echolucent and  pulsatile indicating patency.  1% lidocaine was entering the soft tissues.  The micro needle was used to access the ulnar artery in a retrograde direction microwire followed by micro-sheath.  Puncture was performed under direct ultrasound visualization and a an image was recorded for the permanent record.  5 French sheath was then placed in the Kumpe catheter and Glidewire later an advantage wire were then used to negotiate the catheter first into the proximal brachial artery where hand-injection of contrast was utilized to demonstrate the arterial anatomy of the right arm.  Later attempts were made at accessing the fistula but again the number of venous communications is so extensive there is no clear way in multiple obliquities to define where the actual anastomosis is attempts at negotiating a wire into the cephalic vein itself were unsuccessful.  And at this point the procedure was terminated.  Interpretation of the arterial portion the brachial artery is widely patent there is patency of the radial artery at its origin.  The ulnar artery and interosseous artery remain patent.  There are no hemodynamically significant lesions identified.  There is normal arterial flow.  There is rapid filling of multiple venous tributaries.  A TR band was placed at the wrist and the 5 French venous sheath was pulled and pressure held.  A sterile bandage was applied to the puncture site.    COMPLICATIONS: None  CONDITION: good  Katha Cabal, M.D [facility] Office: [phone]  10/24/2018 9:10 AM

## 2018-10-24 NOTE — H&P (Signed)
[facility] History & Physical Update  The patient was interviewed and re-examined.  The patient's previous History and Physical has been reviewed and is unchanged.  There is no change in the plan of care. We plan to proceed with the scheduled procedure.  [name], MD  10/24/2018, 7:43 AM

## 2018-10-25 ENCOUNTER — Encounter: Payer: BLUE CROSS/BLUE SHIELD | Admitting: Internal Medicine

## 2018-10-25 DIAGNOSIS — E11622 Type 2 diabetes mellitus with other skin ulcer: Secondary | ICD-10-CM | POA: Diagnosis not present

## 2018-10-26 NOTE — Progress Notes (Signed)
SALEM, LEMBKE ([id number]) Visit Report for 10/25/2018 Arrival Information Details Patient Name: Siglin, Nuchem A. Date of Service: 10/25/2018 11:00 AM Medical Record Number: [id number] Patient Account Number: [id number] Date of Birth/Sex: 06/24/1971 (48 y.o. M) Treating RN: Montey Hora Primary Care Zamir Staples: Gaetano Net Other Clinician: Referring Bryla Burek: Gaetano Net Treating Laiylah Roettger/Extender: Tito Dine in Treatment: 32 Visit Information History Since Last Visit Added or deleted any medications: No Patient Arrived: Ambulatory Any new allergies or adverse reactions: No Arrival Time: 11:03 Had a fall or experienced change in No Accompanied By: self activities of daily living that may affect Transfer Assistance: None risk of falls: Patient Identification Verified: Yes Signs or symptoms of abuse/neglect since last visito No Secondary Verification Process Yes Hospitalized since last visit: No Completed: Implantable device outside of the clinic excluding No Patient Requires Transmission-Based No cellular tissue based products placed in the center Precautions: since last visit: Patient Has Alerts: Yes Has Dressing in Place as Prescribed: Yes Patient Alerts: BP LEFT ARM Has Compression in Place as Prescribed: Yes ONLY Pain Present Now: No Electronic Signature(s) Signed: 10/25/2018 4:40:34 PM By: Montey Hora Entered By: Montey Hora on 10/25/2018 11:04:23 Gauger, Javontae A. ([id number]) -------------------------------------------------------------------------------- Encounter Discharge Information Details Patient Name: Lopes, Chrles A. Date of Service: 10/25/2018 11:00 AM Medical Record Number: [id number] Patient Account Number: [id number] Date of Birth/Sex: 08-02-1971 (48 y.o. M) Treating RN: Montey Hora Primary Care Hanzel Pizzo: Gaetano Net Other Clinician: Referring Angus Amini: Gaetano Net Treating Maimuna Leaman/Extender: Tito Dine in Treatment:  22 Encounter Discharge Information Items Discharge Condition: Stable Ambulatory Status: Ambulatory Discharge Destination: Home Transportation: Private Auto Accompanied By: self Schedule Follow-up Appointment: Yes Clinical Summary of Care: Provided on 10/25/2018 Form Type Recipient Paper Patient ch Electronic Signature(s) Signed: 10/25/2018 3:20:57 PM By: Lorine Bears RCP, RRT, CHT Entered By: Lorine Bears on 10/25/2018 11:57:26 Thon, Traylon A. ([id number]) -------------------------------------------------------------------------------- Lower Extremity Assessment Details Patient Name: Pieri, Finnley A. Date of Service: 10/25/2018 11:00 AM Medical Record Number: [id number] Patient Account Number: [id number] Date of Birth/Sex: 10-04-1970 (48 y.o. M) Treating RN: Montey Hora Primary Care Syleena Mchan: Gaetano Net Other Clinician: Referring Gaylynn Seiple: Gaetano Net Treating Quintyn Dombek/Extender: Tito Dine in Treatment: 43 Edema Assessment Assessed: [Left: No] [Right: No] [Left: Edema] [Right: :] Calf Left: Right: Point of Measurement: 36 cm From Medial Instep 65.5 cm cm Ankle Left: Right: Point of Measurement: 14 cm From Medial Instep 48 cm cm Vascular Assessment Pulses: Dorsalis Pedis Palpable: [Left:Yes] Posterior Tibial Palpable: [Left:Yes] Extremity colors, hair growth, and conditions: Extremity Color: [Left:Hyperpigmented] Hair Growth on Extremity: [Left:Yes] Temperature of Extremity: [Left:Warm] Capillary Refill: [Left:< 3 seconds] Toe Nail Assessment Left: Right: Thick: Yes Discolored: Yes Deformed: Yes Improper Length and Hygiene: Yes Electronic Signature(s) Signed: 10/25/2018 4:40:34 PM By: Montey Hora Entered By: Montey Hora on 10/25/2018 11:13:32 Golinski, Prestin A. ([id number]) -------------------------------------------------------------------------------- Multi Wound Chart Details Patient Name: Al, Darreon A. Date  of Service: 10/25/2018 11:00 AM Medical Record Number: [id number] Patient Account Number: [id number] Date of Birth/Sex: Sep 09, 1970 (48 y.o. M) Treating RN: Cornell Barman Primary Care Karnisha Lefebre: Gaetano Net Other Clinician: Referring Shanah Guimaraes: Gaetano Net Treating Roise Emert/Extender: Tito Dine in Treatment: 59 Vital Signs Height(in): 76 Pulse(bpm): 55 Weight(lbs): 397 Blood Pressure(mmHg): 146/67 Body Mass Index(BMI): 48 Temperature(F): 98.2 Respiratory Rate 20 (breaths/min): Photos: [N/A:N/A] Wound Location: Left Lower Leg - Lateral Left Lower Leg - Lateral, N/A Posterior Wounding Event: Gradually Appeared Gradually Appeared N/A Primary Etiology: Diabetic Wound/Ulcer of the Diabetic Wound/Ulcer of the N/A Lower Extremity Lower  Extremity Secondary Etiology: N/A Lymphedema N/A Comorbid History: Hypertension, Type II Hypertension, Type II N/A Diabetes, Neuropathy Diabetes, Neuropathy Date Acquired: 10/25/2018 09/30/2017 N/A Weeks of Treatment: 0 43 N/A Wound Status: Open Open N/A Measurements L x W x D 1.8x1.6x0.1 0.1x0.1x0.1 N/A (cm) Area (cm) : 2.262 0.008 N/A Volume (cm) : 0.226 0.001 N/A % Reduction in Area: N/A 100.00% N/A % Reduction in Volume: N/A 100.00% N/A Classification: Grade 2 Grade 2 N/A Exudate Amount: Medium Small N/A Exudate Type: Serous Serous N/A Exudate Color: amber amber N/A Wound Margin: Flat and Intact Indistinct, nonvisible N/A Granulation Amount: Small (1-33%) None Present (0%) N/A Granulation Quality: Pink N/A N/A Necrotic Amount: Small (1-33%) Small (1-33%) N/A Exposed Structures: Fat Layer (Subcutaneous Fat Layer (Subcutaneous N/A Tissue) Exposed: Yes Tissue) Exposed: Yes Fascia: No Fascia: No Tendon: No Tendon: No Reggio, Jiovanni A. ([id number]) Muscle: No Muscle: No Joint: No Joint: No Bone: No Bone: No Epithelialization: Small (1-33%) None N/A Periwound Skin Texture: Excoriation: No Excoriation: No N/A Induration:  No Induration: No Callus: No Callus: No Crepitus: No Crepitus: No Rash: No Rash: No Scarring: No Scarring: No Periwound Skin Moisture: Maceration: No Maceration: No N/A Dry/Scaly: No Dry/Scaly: No Periwound Skin Color: Atrophie Blanche: No Atrophie Blanche: No N/A Cyanosis: No Cyanosis: No Ecchymosis: No Ecchymosis: No Erythema: No Erythema: No Hemosiderin Staining: No Hemosiderin Staining: No Mottled: No Mottled: No Pallor: No Pallor: No Rubor: No Rubor: No Temperature: No Abnormality No Abnormality N/A Tenderness on Palpation: No No N/A Wound Preparation: Ulcer Cleansing: Ulcer Cleansing: N/A Rinsed/Irrigated with Saline Rinsed/Irrigated with Saline Topical Anesthetic Applied: Topical Anesthetic Applied: Other: lidocaine 4% Other: lidocaine 4% Treatment Notes Wound #18 (Left, Lateral Lower Leg) Notes Silver cell, ABD, 4 layer Wound #2 (Left, Lateral, Posterior Lower Leg) Notes Silver cell, ABD, 4 layer Electronic Signature(s) Signed: 10/25/2018 4:42:30 PM By: Linton Ham MD Entered By: Linton Ham on 10/25/2018 12:29:31 Geimer, Ramiro AMarland Kitchen ([id number]) -------------------------------------------------------------------------------- [place] Details Patient Name: Mcclintic, Trevonn A. Date of Service: 10/25/2018 11:00 AM Medical Record Number: [id number] Patient Account Number: [id number] Date of Birth/Sex: 11-20-1970 (48 y.o. M) Treating RN: Cornell Barman Primary Care Aryav Wimberly: Gaetano Net Other Clinician: Referring Anjelo Pullman: Gaetano Net Treating Tashanda Fuhrer/Extender: Tito Dine in Treatment: 55 Active Inactive Abuse / Safety / Falls / Self Care Management Nursing Diagnoses: Potential for falls Goals: Patient will remain injury free related to falls Date Initiated: 12/28/2017 Target Resolution Date: 04/08/2018 Goal Status: Active Interventions: Assess fall risk on admission and as needed Notes: Nutrition Nursing  Diagnoses: Impaired glucose control: actual or potential Goals: Patient/caregiver verbalizes understanding of need to maintain therapeutic glucose control per primary care physician Date Initiated: 12/28/2017 Target Resolution Date: 04/08/2018 Goal Status: Active Interventions: Provide education on elevated blood sugars and impact on wound healing Notes: Orientation to the Wound Care Program Nursing Diagnoses: Knowledge deficit related to the wound healing center program Goals: Patient/caregiver will verbalize understanding of the [facility] Date Initiated: 12/28/2017 Target Resolution Date: 04/08/2018 Goal Status: Active Interventions: Provide education on orientation to the wound center Votaw, Emani A. ([id number]) Notes: Venous Leg Ulcer Nursing Diagnoses: Knowledge deficit related to disease process and management Goals: Patient will maintain optimal edema control Date Initiated: 03/27/2018 Target Resolution Date: 04/27/2018 Goal Status: Active Interventions: Assess peripheral edema status every visit. Compression as ordered Treatment Activities: Therapeutic compression applied : 03/22/2018 Notes: Wound/Skin Impairment Nursing Diagnoses: Impaired tissue integrity Goals: Ulcer/skin breakdown will heal within 14 weeks Date Initiated: 12/28/2017 Target Resolution Date: 04/08/2018  Goal Status: Active Interventions: Assess patient/caregiver ability to obtain necessary supplies Assess patient/caregiver ability to perform ulcer/skin care regimen upon admission and as needed Assess ulceration(s) every visit Notes: Electronic Signature(s) Signed: 10/25/2018 5:55:39 PM By: Gretta Cool, BSN, RN, CWS, Kim RN, BSN Entered By: Gretta Cool, BSN, RN, CWS, Kim on 10/25/2018 11:33:50 Awtrey, Yousaf A. ([id number]) -------------------------------------------------------------------------------- Pain Assessment Details Patient Name: Nordlund, Zyir A. Date of Service: 10/25/2018 11:00  AM Medical Record Number: [id number] Patient Account Number: [id number] Date of Birth/Sex: Jul 27, 1971 (48 y.o. M) Treating RN: Montey Hora Primary Care Blessed Girdner: Gaetano Net Other Clinician: Referring Raoul Ciano: Gaetano Net Treating Will Heinkel/Extender: Tito Dine in Treatment: 36 Active Problems Location of Pain Severity and Description of Pain Patient Has Paino No Site Locations Pain Management and Medication Current Pain Management: Electronic Signature(s) Signed: 10/25/2018 4:40:34 PM By: Montey Hora Entered By: Montey Hora on 10/25/2018 11:04:31 Meacham, Tahji A. ([id number]) -------------------------------------------------------------------------------- Patient/Caregiver Education Details Patient Name: Gergen, Fahd A. Date of Service: 10/25/2018 11:00 AM Medical Record Number: [id number] Patient Account Number: [id number] Date of Birth/Gender: 07/22/1971 (48 y.o. M) Treating RN: Cornell Barman Primary Care Physician: Gaetano Net Other Clinician: Referring Physician: Gaetano Net Treating Physician/Extender: Tito Dine in Treatment: 8 Education Assessment Education Provided To: Patient Education Topics Provided Venous: Handouts: Controlling Swelling with Multilayered Compression Wraps Methods: Demonstration, Explain/Verbal Responses: State content correctly Electronic Signature(s) Signed: 10/25/2018 5:55:39 PM By: Gretta Cool, BSN, RN, CWS, Kim RN, BSN Entered By: Gretta Cool, BSN, RN, CWS, Kim on 10/25/2018 12:02:25 Innes, [name] ([id number]) -------------------------------------------------------------------------------- Wound Assessment Details Patient Name: Iglehart, Keir A. Date of Service: 10/25/2018 11:00 AM Medical Record Number: [id number] Patient Account Number: [id number] Date of Birth/Sex: Jun 04, 1971 (48 y.o. M) Treating RN: Montey Hora Primary Care Petar Mucci: Gaetano Net Other Clinician: Referring Zelina Jimerson: Gaetano Net Treating  Krystan Northrop/Extender: Tito Dine in Treatment: 40 Wound Status Wound Number: 18 Primary Etiology: Diabetic Wound/Ulcer of the Lower Extremity Wound Location: Left Lower Leg - Lateral Wound Status: Open Wounding Event: Gradually Appeared Comorbid Hypertension, Type II Diabetes, Date Acquired: 10/25/2018 History: Neuropathy Weeks Of Treatment: 0 Clustered Wound: No Photos Photo Uploaded By: Montey Hora on 10/25/2018 11:52:47 Wound Measurements Length: (cm) 1.8 Width: (cm) 1.6 Depth: (cm) 0.1 Area: (cm) 2.262 Volume: (cm) 0.226 % Reduction in Area: % Reduction in Volume: Epithelialization: Small (1-33%) Tunneling: No Undermining: No Wound Description Classification: Grade 2 Foul O Wound Margin: Flat and Intact Slough Exudate Amount: Medium Exudate Type: Serous Exudate Color: amber dor After Cleansing: No /Fibrino Yes Wound Bed Granulation Amount: Small (1-33%) Exposed Structure Granulation Quality: Pink Fascia Exposed: No Necrotic Amount: Small (1-33%) Fat Layer (Subcutaneous Tissue) Exposed: Yes Necrotic Quality: Adherent Slough Tendon Exposed: No Muscle Exposed: No Joint Exposed: No Bone Exposed: No Periwound Skin Texture Innis, Zariah A. ([id number]) Texture Color No Abnormalities Noted: No No Abnormalities Noted: No Callus: No Atrophie Blanche: No Crepitus: No Cyanosis: No Excoriation: No Ecchymosis: No Induration: No Erythema: No Rash: No Hemosiderin Staining: No Scarring: No Mottled: No Pallor: No Moisture Rubor: No No Abnormalities Noted: No Dry / Scaly: No Temperature / Pain Maceration: No Temperature: No Abnormality Wound Preparation Ulcer Cleansing: Rinsed/Irrigated with Saline Topical Anesthetic Applied: Other: lidocaine 4%, Treatment Notes Wound #18 (Left, Lateral Lower Leg) Notes Silver cell, ABD, 4 layer Electronic Signature(s) Signed: 10/25/2018 4:40:34 PM By: Montey Hora Entered By: Montey Hora on  10/25/2018 11:17:39 Oplinger, Treyten A. ([id number]) -------------------------------------------------------------------------------- Wound Assessment Details Patient Name: Downs, Kiowa A. Date of Service: 10/25/2018 11:00 AM Medical Record Number: [id number]  Patient Account Number: [id number] Date of Birth/Sex: [birth year] (48 y.o. M) Treating RN: Montey Hora Primary Care Tyrez [name]: Gaetano Net Other Clinician: Referring Annissa Andreoni: Gaetano Net Treating Tichina Koebel/Extender: Tito Dine in Treatment: 66 Wound Status Wound Number: 2 Primary Etiology: Diabetic Wound/Ulcer of the Lower Extremity Wound Location: Left Lower Leg - Lateral, Posterior Secondary Lymphedema Wounding Event: Gradually Appeared Etiology: Date Acquired: 09/30/2017 Wound Status: Open Weeks Of Treatment: 43 Comorbid History: Hypertension, Type II Diabetes, Clustered Wound: No Neuropathy Photos Photo Uploaded By: Montey Hora on 10/25/2018 11:22:17 Wound Measurements Length: (cm) 0.1 Width: (cm) 0.1 Depth: (cm) 0.1 Area: (cm) 0.008 Volume: (cm) 0.001 % Reduction in Area: 100% % Reduction in Volume: 100% Epithelialization: None Tunneling: No Undermining: No Wound Description Classification: Grade 2 Wound Margin: Indistinct, nonvisible Exudate Amount: Small Exudate Type: Serous Exudate Color: amber Foul Odor After Cleansing: No Slough/Fibrino Yes Wound Bed Granulation Amount: None Present (0%) Exposed Structure Necrotic Amount: Small (1-33%) Fascia Exposed: No Necrotic Quality: Adherent Slough Fat Layer (Subcutaneous Tissue) Exposed: Yes Tendon Exposed: No Muscle Exposed: No Joint Exposed: No Bone Exposed: No Periwound Skin Texture Woolworth, Deric A. ([id number]) Texture Color No Abnormalities Noted: No No Abnormalities Noted: No Callus: No Atrophie Blanche: No Crepitus: No Cyanosis: No Excoriation: No Ecchymosis: No Induration: No Erythema: No Rash: No Hemosiderin Staining:  No Scarring: No Mottled: No Pallor: No Moisture Rubor: No No Abnormalities Noted: No Dry / Scaly: No Temperature / Pain Maceration: No Temperature: No Abnormality Wound Preparation Ulcer Cleansing: Rinsed/Irrigated with Saline Topical Anesthetic Applied: Other: lidocaine 4%, Treatment Notes Wound #2 (Left, Lateral, Posterior Lower Leg) Notes Silver cell, ABD, 4 layer Electronic Signature(s) Signed: 10/25/2018 4:40:34 PM By: Montey Hora Entered By: Montey Hora on 10/25/2018 11:15:24 Rekowski, Siddiq A. ([id number]) -------------------------------------------------------------------------------- Vitals Details Patient Name: Boughner, Javon A. Date of Service: 10/25/2018 11:00 AM Medical Record Number: [id number] Patient Account Number: [id number] Date of Birth/Sex: February 26, 1971 (48 y.o. M) Treating RN: Montey Hora Primary Care Buren Havey: Gaetano Net Other Clinician: Referring Temperence Zenor: Gaetano Net Treating Keisa Blow/Extender: Tito Dine in Treatment: 66 Vital Signs Time Taken: 11:04 Temperature (F): 98.2 Height (in): 76 Pulse (bpm): 55 Weight (lbs): 397 Respiratory Rate (breaths/min): 20 Body Mass Index (BMI): 48.3 Blood Pressure (mmHg): 146/67 Reference Range: 80 - 120 mg / dl Electronic Signature(s) Signed: 10/25/2018 4:40:34 PM By: Montey Hora Entered By: Montey Hora on 10/25/2018 11:10:03

## 2018-10-26 NOTE — Progress Notes (Signed)
INDY, KUCK ([id number]) Visit Report for 10/25/2018 HPI Details Patient Name: [name], [name] Date of Service: 10/25/2018 11:00 AM Medical Record Number: [id number] Patient Account Number: [id number] Date of Birth/Sex: Dec 10, 1970 (48 y.o. M) Treating RN: Cornell Barman Primary Care Provider: Gaetano Net Other Clinician: Referring Provider: Gaetano Net Treating Provider/Extender: Tito Dine in Treatment: 41 History of Present Illness HPI Description: 12/28/17; this is a now 48 year old man who is a type II diabetic. He was hospitalized from 10/01/17 through 10/19/17. He had an MSSA soft tissue and skin infection. 2 open areas on the left leg were identified he has a smaller area on the left medial calf superiorly just below the knee and a wound just above the left ankle on the posterior medial aspect. I think both of these were surgical IandD sites when he was in the hospital. He was discharged with a wound VAC at that point however this is since been taken off. He follows with Dr. Ola Spurr for the [facility] and he is still on chronic Keflex at 500 twice a day. At that time he was hospitalized his hemoglobin A1c was 15.1 however if I'm reading his endocrinologist notes correctly that is improved. He has been following with Dr. [name] at vein and vascular and he has been applying calcium alginate and Unna boots. He has home health changing the dressing. They have also been attempting to get him external compression pumps although the patient is unaware whether they've been approved by insurance at this point. as mentioned he has a smaller clean wound on the right lateral calf just below the knee and he has a much larger area just above the left ankle medially and posteriorly. Our intake [name] reported greenish purulent looking drainage.the patient did have surgical material sent to pathology in February. This showed chronic abscess The patient also has lymphedema stage III in the left  greater than right lower extremities. He has a history of blisters with wounds but these of all were always healed. The patient thinks that the lymphedema may have been present since he was about 48 years old i.e. about 30 years. He does not have graded pressure stockings and has not worn stockings. He does not have a distant history of DVT PE or phlebitis. He has not been systemically unwell fever no chills. He states that his Lasix is recently been reduced. He tells me his kidney function is at "30%" and he has been followed by Dr. [name] of nephrology. At one point he was on Lasix 80 twice a day however that's been cut back and he is now on Lasix at 20 twice a day. The patient has a history of PAD listed in his records although he comes from Dr. [name] I don't think is felt to have significant PAD. ABIs in our clinic were noncompressible bilaterally. 01/04/18; patient has a large wound on the left lateral lower calf and a small wound on the left medial upper calf. He has been to see his nephrologist who changed him to Demadex 40 mg a day. I'm hopeful this will help with his systemic fluid overload. He also has stage III lymphedema. Really no change in the 2 wounds since last week 01/11/18; the patient is down 13 pounds. He put his stage III lymphedema left leg in 4K compression last week and there is less edema fluid however we still haven't been able to communicate with home health but apparently it is [facility] but the dressings have not been changed. The  patient noted an odor last week. He is also had compression pumps ordered by [facility] this as a not completed the paperwork stage. 01/18/18; patient continues to lose weight. Stage III lymphedema in the left greater than right leg under for alert compression. The major wound is on the left lateral ankle area. He apparently has bilateral compression pumps being brought to his house, these were ordered by [facility] Notable for the fact today he had some blisters on the right anterior leg together with some skin nodules. This is no doubt secondary to severe lymphedema. 01/25/18; the patient has obtained his compression pumps and is using them per vein and vascular instructions 3 times a day for an hour. He also saw [name] of vein and vascular. He was felt to have venous insufficiency but did not suggest any intervention also improved edema. It was suggested that he have compression stockings 20-30 mm on a daily basis in addition to compression pumps. [name], [name] ([id number]) The patient arrives in clinic today with a layer of unna under for layer compression. he seems to have some trouble with the degree of compression. He has open areas on the left lateral ankle area which is his major wound left upper medial calf and a superficial open area on the right anterior shin area which was blistered last week. He has skin changes on the right anterior calf which I think are no doubt secondary to lymphedema skin nodules etc. 02/01/18; the patient comes in telling us his nephrologist have to his torsemide. Unfortunately today he is put on 7 pounds by our scales. He has blisters all over the anterior and medial part of his right calf and a new open wound. He also has soupy green drainage coming out of the left lateral calf /ankle wound. 02/08/18; culture I did last week of the left lateral ankle wound grew both Pseudomonas and Morganella. He is on Keflex from Dr. Ola Spurr in the hospital. I will need to review these notes.in any case Keflex is not going to cover these 2 organisms. I'm probably going to added ciprofloxacin today for 1 week. A lot of drainage that looks purulent last week. He is not complaining of pain however he has managed to put on 10 pounds in 2 weeks by our scales in this clinic. He is going to see his nephrologist tomorrow 02/15/18; he completed the ciprofloxacin I gave him last week.  Notable that he is up to 379 pounds today which is up 16 pounds from 2 weeks ago. He is complaining of orthopnea but doesn't have any chest pain. 02/22/18; he continues to have weight gain. R intake [name] reports again purulent green drainage coming out of the lateral wound on the lateral left calf. He has small open area on the right anterior leg. His torsemide was increased to 2 tablets a day I believe this is 40 mg last week in response to the call admitted to Dr. Keturah Barre office. He follows up with Dr. [name] and Dr. Delana Meyer tomorrow 03/01/18 his weight essentially stable today at 383 pounds. Drainage out of the left lateral wound on the lower left calf/ankle is a lot less. Culture last time grew Pseudomonas. I put him on cefdinir. He has been to Dr. Keturah Barre office no adjustments in his diuretics. Dr. Nicoletta Dress prescribed a wraparound stocking for the right leg.there is no open area on the right leg. He has a superficial area on the left medial calf, left posterior  calf and in the large area on the left lateral however this looks better 03/15/18; weight is not up to 393 pounds. He saw his nephrologist yesterday Dr. [name] will increase the Demadex I'm hopeful this will help with the edema control. I'm using silver alginate to all his wounds. In particular the left lateral ankle looks better. oUnfortunately he has new open areas on the right lateral calf that will include use of his compression stocking at least in the short to medium term. He has new wounds o3 on the right lateral calf. One of these has some size however all numerous superficial 03/22/18; his weight is stabilized a bit. Just adjustment of his diuretics by his nephrologist. There is no doubt he has some degree of systemic fluid overload on top of severe left greater than right lymphedema. 2 weeks ago tried to transition him to stockings on the right leg however he developed re-breakdown of skin on the right leg and we had to put him back  in compression last week. He also uses external compression pumps and claims to be compliant Continued concern about his depression today 03/29/18; several ongoing issues with this patient; oHe no longer has home health coverage apparently secondary to a lapse in insurance. He is apparently transitioning from short for long-term disability. [facility] was changing his compression wraps on Monday and Friday. oHe has gained 10 pounds since last week oSees Dr. [name] tomorrow oSaw his primary doctor last week about the depression. It sounds as though he declined pharmacologic management. He seems somewhat better today. We were concerned last week when he came. Somewhat better today oHe is using his compression pumps once a day at home, I have asked for twice a day if possible especially on the left leg oFollows up with his nephrologist in mid-August. He is managing his diuretic for I think stage IV chronic renal failure oParadoxically his wounds actually look better 04/19/18; the patient has not been seen since I last saw him 3 weeks ago. He saw Dr. Delana Meyer of vascular surgery on 03/30/18 I believe he put him in a 20/30 stocking bilaterally with a wraparound extremitease stocking. He has not been putting anything specifically on the wound. More problematic than that he has not been wearing the stockings he is at home. He has been using his external compression pumps once per day according to him on a rare occasion twice On a psychosocial level the patient is now on long-term disability and is applying for COBRA therefore he is between insurances. He has not been able to follow up with Dr. [name] who is his nephrologist as a result. As noted his weight is up to 407 pounds today. He promises me he'll follow-up with Dr. [name] 05/03/18; he hasn't been here in 2 weeks now. He apparently has been wearing a compression sock on the left leg. Massive increase in edema 3 large open wounds on the left anterior  leg that were probably blisters. Significant deterioration in the left lateral ankle wound that we've been doing as his most problematic wound. He still does not have his insurance issues wrapped up. His weight is well over 400 pounds. 05/10/18; arrives today with better looking edema control in the left leg. He has been using his compression pumps twice a day. We also wrapped it is left leg for there he's been using his pumps so there is much better edema control. Most of new Riehle, Dontreal A. ([id number]) wounds from last week look a  lot better. Even the refractory area on the lower left lateral ankle looks a lot better to me today. He follows up with Dr. [name] this morning [nephrology] 05/17/18 patient arrives today with a lot less edema in the left leg. His weight is gone down 7 pounds. He tells me he saw Dr. [name] but his torsemide was not adjusted. He is using the palms 3 times a day. There is been quite an improvement in the remaining wound on the left lateral ankle oWeekly visit for follow-up of bilateral lower extremity wounds related to severe lymphedema and probably some degree of systemic fluid overload from chronic renal failure stage IV. His weight is up this week to over 400 pounds. He noted increasing edema in the right leg late last week. He took his compression stocking off he did not increase the frequency of this compression pump use. He developed a large blister on the back of the right calf. He also has a new opened blister on the left lateral calf in addition to the wound that we've been using on the distal left lower calf area and we've been using silver alginate. He tells me that he has an ultrasound which is a DVT rule out and I think a reflux study ordered by Dr. [name] 05/31/18; weekly visit. He went to see vein and vascular this week apparently they remove the 4-layer compression on the left and put an Unna boot on him in replacement. He's got more swelling in the left leg  is resolved. That being said his left lower Wound is better. He still has a fairly large wound on the right posterior calf this was not disturbed. He has a follow-up appointment with Dr. Keturah Barre nephrologist next week 06/07/2018; the patient arrives today with a history that he took both his compression wraps off 3 days ago in order to take a shower. He has bilateral severe tense blisters. A lot of weeping erythema especially on the lateral left calf. Almost circumferential blisters on the right. Multiple areas of epithelial breakdown. He does not complain of any pain fever chills. He states he has been using his compression pump in some form of stocking that I could not really determine the type. He did not come into the clinic with anything on his legs. He tells me he has an appointment with Dr. Merita Norton his nephrologist I believe this Friday. Paradoxically his weight is actually down to 385 pounds I believe last week he was over 400 06/14/18; arrives with better looking wound surfaces today on both legs. There is no further blistering however there are still areas that aren't epithelialized on the right anterior, right lateral, left posterior and left medial. His original wound just above the left ankle laterally is still open moist. His weight was about the same today. He tells me that his nephrologist increase the torsemide from 40/20/09/1939/40 twice a day 06/21/18; both his wraps fell down to his mid calf. Has a result he has multiple blisters across the anterior right leg above with the wraps ultimately watched. He also has blisters on the posterior part of the left calf o2. His original wound on the left lateral calf is hard to see any open area. There is however a divot. Which is going to be difficult to deal with into the future until the edema in the left leg is controlled. 2 small superficial areas remain He tells [place] his weight was 386 pounds on his scale at home. According to our scale he  is up 5 pounds. He is not on a fluid restriction 06/28/18; patient's weight is gone up 2 pounds since last time. He has weeping edema and open superficial wounds on the right posterior right lateral and right anterior calf. He has a small open area on the left anterior probably left posterior calf and the original wound on the left lateral calf appears to be just about closed He is being planned for a dialysis shunt in the right arm through [facility] incoordination with his nephrologist Dr. [name] He claims to be using his compression pumps twice a day. He has lymphedema and no doubt systemic fluid volume overload from stage IV chronic renal failure 07/04/18; patient's weight is up into the 396 range. He is supposed to see his cardiologist tomorrow and plans are being made for a shunt by Dr. Delana Meyer. He still has significant open wounds/draining areas on the right anterior and right posterior calf. Several small open areas on the left which are less in terms of wound area on the right which is surprising given the fact the left is the larger most lymphedematous leg 07/26/2018 She is seen today for follow-up and management of lateral posterior lower leg wound and bilateral lymphoedema. He has a very flat affect and depressed mood today. Unable to elicit much information from him today. No thoughts of harm to self identified. Recently had a follow-up with vascular vein for fistula placement to initiate dialysis in the right arm. He has 2 dressings on the right arm from the fistula procedure with no bleeding or drainage noted on the dressing. He does have a large amount of bruising in the surrounding to puncture areas on the right lower arm from the fistula placement. No pain elicited with palpation of the right arm. He denies a diminished sensation in that right lower arm as well. He does have a small wound to the left lateral posterior lower leg. No visual drainage present to either leg.  However the wound to the left lower leg does have a foul odor even after cleaning it. The left is the larger most lymphedematous leg with the right Leg still having a significant amount of edema. No drainage or blisters present on either leg today. He states that he is using the lymphedema pumps twice a day. Not too sure if he is compliant with actually using the lymphedema pumps based on the amount of edema that he has in his legs. Weight increased from 396.1 to 401.6. No recent fevers, chills, or shortness of breath. 08/02/18; the patient only has one remaining wound on the left lateral calf which is still open. This is in the resultant [facility]. ([id number]) shaped area which was once the site of the major wound. He does not have any open areas on the right leg nor additional wounds on the left no blistering. As usual the left leg is much larger than the right. He comes in today stating that he took the wraps off on the right leg on Friday and since then he has presumably been wearing his stocking which is a wraparound variant stocking on the right. States he took the 4-layer compression off his left leg this morning to shower. He is seeing vein and vascular tomorrow. He states he is using his compression pumps once or twice a day. He also admits that he has been on his feet a lot 08/09/18; the patient has the left leg healed except for the original wound site on  the left medial Just above the ankle. On the right he has a new wound on the right lateral calf. He saw vein and vascular last week and he is been back in his pressure stockings and wraparound stockings. He also is been put on metolazone 3 times a week by nephrology along with his torsemide. I'm hopeful that this will help with some of the lower extremity edema. I've always felt that he has lymphedema but there may be a secondary component contributing 08/16/18; the patient used his own junk slight stockings to both legs last  week. He claims he is pumping once to twice a day at home. Here rise with his legs looking visibly less edematous. His usual left greater than right secondary to lymphedema. He is tolerating the metolazone 3 times a week along with the torsemide. His weight was down 2 pounds. He follows up with nephrology soon for follow-up lab work I think in early January. He does not have any open areas on the right leg he still has the crevice on the lateral left calf just above the ankle there is 2 small open areas here I am not sure that this is ever going to be free of an open area although certainly not worsening. More problematic lead he has 2 blistered areas just above the wrist on the left lateral calf 09/06/2018; the patient arrives with much larger legs than I remember seeing a month ago especially on the right. He has 1 open area on the left lateral calf and he had a 4 layer compression on this on arrival in the clinic. This was apparently put on by [facility] in response to a new open wound. He claims he is pumping once or more often twice a day although I really have a hard time believing it. He came in with nothing but stockings on his legs. There is tremendous increase in the edema bilaterally 1/22; the patient only has the one area that is not totally closed and that is the divot on the left lateral calf. I do not think we are going to be able to do anything further to this unless he loses edema fluid in his left leg when he starts dialysis which I gather is sometime soon. He is using his own wraparound compression garment. He is using the compression pumps twice a day. 2/19; the patient does not have any open wounds on the right leg but both legs left and the right have extensive edema is usually worse on the left. He has a tense blister on the left anterior leg. Using his compression pumps usually once a day per his description. He does not come in with the compression stockings 2/26. Right leg  remains without any open wounds that he has his own stocking. The tense blister on the left anterior leg is closed over. He still has the open area on the left lateral mid calf and then the divot injury area which I do not think is going to heal just above the left ankle. Electronic Signature(s) Signed: 10/25/2018 4:42:30 PM By: Linton Ham MD Entered By: Linton Ham on 10/25/2018 12:30:10 [name], [name] Kitchen ([id number]) -------------------------------------------------------------------------------- Physical Exam Details Patient Name: Hazelett, Keylen A. Date of Service: 10/25/2018 11:00 AM Medical Record Number: [id number] Patient Account Number: [id number] Date of Birth/Sex: [birth year] (48 y.o. M) Treating RN: Cornell Barman Primary Care Provider: Gaetano Net Other Clinician: Referring Provider: Gaetano Net Treating Provider/Extender: Tito Dine in Treatment: 74 Constitutional Sitting or standing Blood  Pressure is within target range for patient.. Pulse regular and within target range for patient.[name] Respirations regular, non-labored and within target range.. Temperature is normal and within the target range for the patient.[name] appears in no distress. Respiratory Respiratory effort is easy and symmetric bilaterally. Rate is normal at rest and on room air.. Cardiovascular Femoral arteries palpable. Pedal pulses palpable and strong. Integumentary (Hair, Skin) Severe chronic lymphedema. Psychiatric No evidence of depression, anxiety, or agitation. Calm, cooperative, and communicative. Appropriate interactions and affect.. Notes The patient has a small dime sized wound on the left lateral mid calf. This appears to have a clean surface. We do not have good edema control in the left leg. oThe area on the left lateral calf just above the ankle area looks about the same. I think this is always going to have some open area and some macerated tissue. It would be interesting to see  what happens here after dialysis is initiated Electronic Signature(s) Signed: 10/25/2018 4:42:30 PM By: Linton Ham MD Entered By: Linton Ham on 10/25/2018 12:32:11 [name], [name] ([id number]) -------------------------------------------------------------------------------- Physician Orders Details Patient Name: Raj, Jacobe A. Date of Service: 10/25/2018 11:00 AM Medical Record Number: [id number] Patient Account Number: [id number] Date of Birth/Sex: 25-Apr-1971 (48 y.o. M) Treating RN: Cornell Barman Primary Care Provider: Gaetano Net Other Clinician: Referring Provider: Gaetano Net Treating Provider/Extender: Tito Dine in Treatment: 61 Verbal / Phone Orders: No Diagnosis Coding Wound Cleansing Wound #2 Left,Lateral,Posterior Lower Leg o Clean wound with Normal Saline. o Cleanse wound with mild soap and water Anesthetic (add to Medication List) Wound #2 Left,Lateral,Posterior Lower Leg o Topical Lidocaine 4% cream applied to wound bed prior to debridement (In Clinic Only). Primary Wound Dressing Wound #18 Left,Lateral Lower Leg o Silver Alginate - on all draining areas Wound #2 Left,Lateral,Posterior Lower Leg o Silver Alginate - on all draining areas Secondary Dressing Wound #18 Left,Lateral Lower Leg o ABD pad Wound #2 Left,Lateral,Posterior Lower Leg o ABD pad Dressing Change Frequency Wound #18 Left,Lateral Lower Leg o Change dressing every week Wound #2 Left,Lateral,Posterior Lower Leg o Change dressing every week - Wednesday Follow-up Appointments Wound #18 Left,Lateral Lower Leg o Return Appointment in 2 weeks. Wound #2 Left,Lateral,Posterior Lower Leg o Return Appointment in 2 weeks. Edema Control Wound #18 Left,Lateral Lower Leg o 4-Layer Compression System - Left Lower Extremity. o Patient to wear own compression stockings - Right Mashaw, Donevin A. ([id number]) o Compression Pump: Use compression pump on left lower  extremity for 30 minutes, twice daily. - 1 Hour three times daily o Compression Pump: Use compression pump on right lower extremity for 30 minutes, twice daily. - 1 Hour three times daily Wound #2 Left,Lateral,Posterior Lower Leg o 4-Layer Compression System - Left Lower Extremity. o Patient to wear own compression stockings - Right o Compression Pump: Use compression pump on left lower extremity for 30 minutes, twice daily. - 1 Hour three times daily o Compression Pump: Use compression pump on right lower extremity for 30 minutes, twice daily. - 1 Hour three times daily Home Health Wound #18 [place] Visits o Home Health [name] may visit PRN to address patientos wound care needs. o FACE TO FACE ENCOUNTER: MEDICARE and MEDICAID PATIENTS: I certify that this patient is under my care and that I had a face-to-face encounter that meets the physician face-to-face encounter requirements with this patient on this date. The encounter with the patient was in whole or in part for the following MEDICAL  CONDITION: (primary reason for Home Healthcare) MEDICAL NECESSITY: I certify, that based on my findings, NURSING services are a medically necessary home health service. HOME BOUND STATUS: I certify that my clinical findings support that this patient is homebound (i.e., Due to illness or injury, pt requires aid of supportive devices such as crutches, cane, wheelchairs, walkers, the use of special transportation or the assistance of another person to leave their place of residence. There is a normal inability to leave the home and doing so requires considerable and taxing effort. Other absences are for medical reasons / religious services and are infrequent or of short duration when for other reasons). o If current dressing causes regression in wound condition, may D/C ordered dressing product/s and apply Normal Saline Moist Dressing daily until next  [place] / Other MD appointment. [place] of regression in wound condition at [phone]. o Please direct any NON-WOUND related issues/requests for orders to patient's Primary Care Physician Wound #2 [place] [name] may visit PRN to address patientos wound care needs. o FACE TO FACE ENCOUNTER: MEDICARE and MEDICAID PATIENTS: I certify that this patient is under my care and that I had a face-to-face encounter that meets the physician face-to-face encounter requirements with this patient on this date. The encounter with the patient was in whole or in part for the following MEDICAL CONDITION: (primary reason for [place]) MEDICAL NECESSITY: I certify, that based on my findings, NURSING services are a medically necessary home health service. HOME BOUND STATUS: I certify that my clinical findings support that this patient is homebound (i.e., Due to illness or injury, pt requires aid of supportive devices such as crutches, cane, wheelchairs, walkers, the use of special transportation or the assistance of another person to leave their place of residence. There is a normal inability to leave the home and doing so requires considerable and taxing effort. Other absences are for medical reasons / religious services and are infrequent or of short duration when for other reasons). o If current dressing causes regression in wound condition, may D/C ordered dressing product/s and apply Normal Saline Moist Dressing daily until next [place] / Other MD appointment. [place] of regression in wound condition at [phone]. o Please direct any NON-WOUND related issues/requests for orders to patient's Primary Care Physician Electronic Signature(s) Signed: 10/25/2018 4:42:30 PM By: Linton Ham MD Signed: 10/25/2018 5:55:39 PM By: Gretta Cool, BSN, RN, CWS, Kim RN,  BSN Entered By: Gretta Cool, BSN, RN, CWS, Kim on 10/25/2018 12:10:39 [name], [name] ([id number]) [name], [name] ([id number]) -------------------------------------------------------------------------------- Problem List Details Patient Name: Yokoyama, Michel A. Date of Service: 10/25/2018 11:00 AM Medical Record Number: [id number] Patient Account Number: [id number] Date of Birth/Sex: 1971/03/30 (48 y.o. 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 ([id number]) -------------------------------------------------------------------------------- Progress Note Details Patient Name: Ralls, Lavar A. Date of Service: 10/25/2018 11:00 AM Medical Record Number: [id number] Patient Account Number: [id number] Date of Birth/Sex: October 28, 1970 (48 y.o. M) Treating RN: Cornell Barman Primary Care Provider: Gaetano Net Other Clinician: Referring Provider: Gaetano Net Treating Provider/Extender: Tito Dine in Treatment: 41 Subjective History of Present Illness (HPI) 12/28/17; this is a now 48 year old man who is a type II diabetic. He was hospitalized from 10/01/17 through 10/19/17.  He had an MSSA soft tissue and skin infection. 2 open areas on the left leg were identified he has a smaller area on the left medial calf superiorly just below the knee and a wound just above the left ankle on the posterior medial aspect. I think both of these were surgical IandD sites when he was in the hospital. He was discharged with a wound VAC at that point however this is since been taken off. He follows with Dr. Ola Spurr for the [facility] and he is still on chronic Keflex at 500 twice a day. At that time he was hospitalized his hemoglobin A1c was 15.1 however if I'm reading his endocrinologist notes correctly that is improved. He has been following with Dr. [name] at vein and vascular and he has been applying calcium alginate and Unna boots. He has home health changing the dressing. They have also been attempting to get him external compression pumps although the patient is unaware whether they've been approved by insurance at this point. as mentioned he has a smaller clean wound on the right lateral calf just below the knee and he has a much larger area just above the left ankle medially and posteriorly. Our intake [name] reported greenish purulent looking drainage.the patient did have surgical material sent to pathology in February. This showed chronic abscess The patient also has lymphedema stage III in the left greater than right lower extremities. He has a history of blisters with wounds but these of all were always healed. The patient thinks that the lymphedema may have been present since he was about 48 years old i.e. about 30 years. He does not have graded pressure stockings and has not worn stockings. He does not have a distant history of DVT PE or phlebitis. He has not been systemically unwell fever no chills. He states that his Lasix is recently been reduced. He tells me his kidney function is at "30%" and he has been followed by Dr. [name] of nephrology. At one point he was on Lasix  80 twice a day however that's been cut back and he is now on Lasix at 20 twice a day. The patient has a history of PAD listed in his records although he comes from Dr. [name] I don't think is felt to have significant PAD. ABIs in our clinic were noncompressible bilaterally. 01/04/18; patient has a large wound on the left lateral lower calf and a small wound on the left medial upper calf. He has been to see his nephrologist who changed him to Demadex 40 mg a day. I'm hopeful this will help with his systemic fluid overload. He also has stage III lymphedema. Really no change in the 2 wounds since last week 01/11/18; the patient is down 13 pounds. He put his stage III lymphedema left leg in 4K compression last week and there is less edema fluid however we still haven't been able to communicate with home health but apparently it is [facility] but the dressings have not been changed. The  patient noted an odor last week. He is also had compression pumps ordered by [facility] this as a not completed the paperwork stage. 01/18/18; patient continues to lose weight. Stage III lymphedema in the left greater than right leg under for alert compression. The major wound is on the left lateral ankle area. He apparently has bilateral compression pumps being brought to his house, these were ordered by [facility] Notable for the fact today he had some blisters on the right anterior leg together with some skin nodules. This is no doubt secondary to severe lymphedema. 01/25/18; the patient has obtained his compression pumps and is using them per vein and vascular instructions 3 times a day for an hour. He also saw [name] of vein and vascular. He was felt to have venous insufficiency but did not suggest any intervention also improved edema. It was suggested that he have compression stockings 20-30 mm on a daily basis in addition to compression pumps. The patient arrives in clinic today with a  layer of unna under for layer compression. he seems to have some trouble with the degree of compression. He has open areas on the left lateral ankle area which is his major wound left upper medial calf and a Soots, Mattix A. ([id number]) superficial open area on the right anterior shin area which was blistered last week. He has skin changes on the right anterior calf which I think are no doubt secondary to lymphedema skin nodules etc. 02/01/18; the patient comes in telling us his nephrologist have to his torsemide. Unfortunately today he is put on 7 pounds by our scales. He has blisters all over the anterior and medial part of his right calf and a new open wound. He also has soupy green drainage coming out of the left lateral calf /ankle wound. 02/08/18; culture I did last week of the left lateral ankle wound grew both Pseudomonas and Morganella. He is on Keflex from Dr. Ola Spurr in the hospital. I will need to review these notes.in any case Keflex is not going to cover these 2 organisms. I'm probably going to added ciprofloxacin today for 1 week. A lot of drainage that looks purulent last week. He is not complaining of pain however he has managed to put on 10 pounds in 2 weeks by our scales in this clinic. He is going to see his nephrologist tomorrow 02/15/18; he completed the ciprofloxacin I gave him last week. Notable that he is up to 379 pounds today which is up 16 pounds from 2 weeks ago. He is complaining of orthopnea but doesn't have any chest pain. 02/22/18; he continues to have weight gain. R intake [name] reports again purulent green drainage coming out of the lateral wound on the lateral left calf. He has small open area on the right anterior leg. His torsemide was increased to 2 tablets a day I believe this is 40 mg last week in response to the call admitted to Dr. Keturah Barre office. He follows up with Dr. [name] and Dr. Delana Meyer tomorrow 03/01/18 his weight essentially stable today at 383 pounds.  Drainage out of the left lateral wound on the lower left calf/ankle is a lot less. Culture last time grew Pseudomonas. I put him on cefdinir. He has been to Dr. Keturah Barre office no adjustments in his diuretics. Dr. Nicoletta Dress prescribed a wraparound stocking for the right leg.there is no open area on the right leg. He has a superficial area on the left medial calf, left posterior  calf and in the large area on the left lateral however this looks better 03/15/18; weight is not up to 393 pounds. He saw his nephrologist yesterday Dr. [name] will increase the Demadex I'm hopeful this will help with the edema control. I'm using silver alginate to all his wounds. In particular the left lateral ankle looks better. Unfortunately he has new open areas on the right lateral calf that will include use of his compression stocking at least in the short to medium term. He has new wounds o3 on the right lateral calf. One of these has some size however all numerous superficial 03/22/18; his weight is stabilized a bit. Just adjustment of his diuretics by his nephrologist. There is no doubt he has some degree of systemic fluid overload on top of severe left greater than right lymphedema. 2 weeks ago tried to transition him to stockings on the right leg however he developed re-breakdown of skin on the right leg and we had to put him back in compression last week. He also uses external compression pumps and claims to be compliant Continued concern about his depression today 03/29/18; several ongoing issues with this patient; He no longer has home health coverage apparently secondary to a lapse in insurance. He is apparently transitioning from short for long-term disability. [facility] was changing his compression wraps on Monday and Friday. He has gained 10 pounds since last week Sees Dr. [name] tomorrow Saw his primary doctor last week about the depression. It sounds as though he declined pharmacologic management.  He seems somewhat better today. We were concerned last week when he came. Somewhat better today He is using his compression pumps once a day at home, I have asked for twice a day if possible especially on the left leg Follows up with his nephrologist in mid-August. He is managing his diuretic for I think stage IV chronic renal failure Paradoxically his wounds actually look better 04/19/18; the patient has not been seen since I last saw him 3 weeks ago. He saw Dr. Delana Meyer of vascular surgery on 03/30/18 I believe he put him in a 20/30 stocking bilaterally with a wraparound extremitease stocking. He has not been putting anything specifically on the wound. More problematic than that he has not been wearing the stockings he is at home. He has been using his external compression pumps once per day according to him on a rare occasion twice On a psychosocial level the patient is now on long-term disability and is applying for COBRA therefore he is between insurances. He has not been able to follow up with Dr. [name] who is his nephrologist as a result. As noted his weight is up to 407 pounds today. He promises me he'll follow-up with Dr. [name] 05/03/18; he hasn't been here in 2 weeks now. He apparently has been wearing a compression sock on the left leg. Massive increase in edema 3 large open wounds on the left anterior leg that were probably blisters. Significant deterioration in the left lateral ankle wound that we've been doing as his most problematic wound. He still does not have his insurance issues wrapped up. His weight is well over 400 pounds. 05/10/18; arrives today with better looking edema control in the left leg. He has been using his compression pumps twice a day. We also wrapped it is left leg for there he's been using his pumps so there is much better edema control. Most of new wounds from last week look a lot better. Even the refractory  area on the lower left lateral ankle looks a lot better to  me today. He follows up with Dr. [name] this morning [nephrology] 05/17/18 patient arrives today with a lot less edema in the left leg. His weight is gone down 7 pounds. He tells me he saw Dr. Helene Kelp, Rick A. ([id number]) [name] but his torsemide was not adjusted. He is using the palms 3 times a day. There is been quite an improvement in the remaining wound on the left lateral ankle Weekly visit for follow-up of bilateral lower extremity wounds related to severe lymphedema and probably some degree of systemic fluid overload from chronic renal failure stage IV. His weight is up this week to over 400 pounds. He noted increasing edema in the right leg late last week. He took his compression stocking off he did not increase the frequency of this compression pump use. He developed a large blister on the back of the right calf. He also has a new opened blister on the left lateral calf in addition to the wound that we've been using on the distal left lower calf area and we've been using silver alginate. He tells me that he has an ultrasound which is a DVT rule out and I think a reflux study ordered by Dr. [name] 05/31/18; weekly visit. He went to see vein and vascular this week apparently they remove the 4-layer compression on the left and put an Unna boot on him in replacement. He's got more swelling in the left leg is resolved. That being said his left lower Wound is better. He still has a fairly large wound on the right posterior calf this was not disturbed. He has a follow-up appointment with Dr. Keturah Barre nephrologist next week 06/07/2018; the patient arrives today with a history that he took both his compression wraps off 3 days ago in order to take a shower. He has bilateral severe tense blisters. A lot of weeping erythema especially on the lateral left calf. Almost circumferential blisters on the right. Multiple areas of epithelial breakdown. He does not complain of any pain fever chills. He states he  has been using his compression pump in some form of stocking that I could not really determine the type. He did not come into the clinic with anything on his legs. He tells me he has an appointment with Dr. Merita Norton his nephrologist I believe this Friday. Paradoxically his weight is actually down to 385 pounds I believe last week he was over 400 06/14/18; arrives with better looking wound surfaces today on both legs. There is no further blistering however there are still areas that aren't epithelialized on the right anterior, right lateral, left posterior and left medial. His original wound just above the left ankle laterally is still open moist. His weight was about the same today. He tells me that his nephrologist increase the torsemide from 40/20/09/1939/40 twice a day 06/21/18; both his wraps fell down to his mid calf. Has a result he has multiple blisters across the anterior right leg above with the wraps ultimately watched. He also has blisters on the posterior part of the left calf o2. His original wound on the left lateral calf is hard to see any open area. There is however a divot. Which is going to be difficult to deal with into the future until the edema in the left leg is controlled. 2 small superficial areas remain He tells [place] his weight was 386 pounds on his scale at home. According to our scale he  is up 5 pounds. He is not on a fluid restriction 06/28/18; patient's weight is gone up 2 pounds since last time. He has weeping edema and open superficial wounds on the right posterior right lateral and right anterior calf. He has a small open area on the left anterior probably left posterior calf and the original wound on the left lateral calf appears to be just about closed He is being planned for a dialysis shunt in the right arm through [facility] incoordination with his nephrologist Dr. [name] He claims to be using his compression pumps twice a day. He has lymphedema and no doubt  systemic fluid volume overload from stage IV chronic renal failure 07/04/18; patient's weight is up into the 396 range. He is supposed to see his cardiologist tomorrow and plans are being made for a shunt by Dr. Delana Meyer. He still has significant open wounds/draining areas on the right anterior and right posterior calf. Several small open areas on the left which are less in terms of wound area on the right which is surprising given the fact the left is the larger most lymphedematous leg 07/26/2018 She is seen today for follow-up and management of lateral posterior lower leg wound and bilateral lymphoedema. He has a very flat affect and depressed mood today. Unable to elicit much information from him today. No thoughts of harm to self identified. Recently had a follow-up with vascular vein for fistula placement to initiate dialysis in the right arm. He has 2 dressings on the right arm from the fistula procedure with no bleeding or drainage noted on the dressing. He does have a large amount of bruising in the surrounding to puncture areas on the right lower arm from the fistula placement. No pain elicited with palpation of the right arm. He denies a diminished sensation in that right lower arm as well. He does have a small wound to the left lateral posterior lower leg. No visual drainage present to either leg. However the wound to the left lower leg does have a foul odor even after cleaning it. The left is the larger most lymphedematous leg with the right Leg still having a significant amount of edema. No drainage or blisters present on either leg today. He states that he is using the lymphedema pumps twice a day. Not too sure if he is compliant with actually using the lymphedema pumps based on the amount of edema that he has in his legs. Weight increased from 396.1 to 401.6. No recent fevers, chills, or shortness of breath. 08/02/18; the patient only has one remaining wound on the left lateral calf  which is still open. This is in the resultant [place] shaped area which was once the site of the major wound. He does not have any open areas on the right leg nor additional wounds on the left no blistering. As usual the left leg is much larger than the right. [name], [name] ([id number]) He comes in today stating that he took the wraps off on the right leg on Friday and since then he has presumably been wearing his stocking which is a wraparound variant stocking on the right. States he took the 4-layer compression off his left leg this morning to shower. He is seeing vein and vascular tomorrow. He states he is using his compression pumps once or twice a day. He also admits that he has been on his feet a lot 08/09/18; the patient has the left leg healed except for the original wound site  on the left medial Just above the ankle. On the right he has a new wound on the right lateral calf. He saw vein and vascular last week and he is been back in his pressure stockings and wraparound stockings. He also is been put on metolazone 3 times a week by nephrology along with his torsemide. I'm hopeful that this will help with some of the lower extremity edema. I've always felt that he has lymphedema but there may be a secondary component contributing 08/16/18; the patient used his own junk slight stockings to both legs last week. He claims he is pumping once to twice a day at home. Here rise with his legs looking visibly less edematous. His usual left greater than right secondary to lymphedema. He is tolerating the metolazone 3 times a week along with the torsemide. His weight was down 2 pounds. He follows up with nephrology soon for follow-up lab work I think in early January. He does not have any open areas on the right leg he still has the crevice on the lateral left calf just above the ankle there is 2 small open areas here I am not sure that this is ever going to be free of an open area although certainly  not worsening. More problematic lead he has 2 blistered areas just above the wrist on the left lateral calf 09/06/2018; the patient arrives with much larger legs than I remember seeing a month ago especially on the right. He has 1 open area on the left lateral calf and he had a 4 layer compression on this on arrival in the clinic. This was apparently put on by [facility] in response to a new open wound. He claims he is pumping once or more often twice a day although I really have a hard time believing it. He came in with nothing but stockings on his legs. There is tremendous increase in the edema bilaterally 1/22; the patient only has the one area that is not totally closed and that is the divot on the left lateral calf. I do not think we are going to be able to do anything further to this unless he loses edema fluid in his left leg when he starts dialysis which I gather is sometime soon. He is using his own wraparound compression garment. He is using the compression pumps twice a day. 2/19; the patient does not have any open wounds on the right leg but both legs left and the right have extensive edema is usually worse on the left. He has a tense blister on the left anterior leg. Using his compression pumps usually once a day per his description. He does not come in with the compression stockings 2/26. Right leg remains without any open wounds that he has his own stocking. The tense blister on the left anterior leg is closed over. He still has the open area on the left lateral mid calf and then the divot injury area which I do not think is going to heal just above the left ankle. Objective Constitutional Sitting or standing Blood Pressure is within target range for patient.. Pulse regular and within target range for patient.[name] Respirations regular, non-labored and within target range.. Temperature is normal and within the target range for the patient.[name] appears in no distress. Vitals Time Taken:  11:04 AM, Height: 76 in, Weight: 397 lbs, BMI: 48.3, Temperature: 98.2 F, Pulse: 55 bpm, Respiratory Rate: 20 breaths/min, Blood Pressure: 146/67 mmHg. Respiratory Respiratory effort is easy and symmetric bilaterally. Rate  is normal at rest and on room air.. Cardiovascular Femoral arteries palpable. Pedal pulses palpable and strong. Psychiatric No evidence of depression, anxiety, or agitation. Calm, cooperative, and communicative. Appropriate interactions and affect.[name] [name], [name] ([id number]) General Notes: The patient has a small dime sized wound on the left lateral mid calf. This appears to have a clean surface. We do not have good edema control in the left leg. The area on the left lateral calf just above the ankle area looks about the same. I think this is always going to have some open area and some macerated tissue. It would be interesting to see what happens here after dialysis is initiated Integumentary (Hair, Skin) Severe chronic lymphedema. Wound #18 status is Open. Original cause of wound was Gradually Appeared. The wound is located on the Left,Lateral Lower Leg. The wound measures 1.8cm length x 1.6cm width x 0.1cm depth; 2.262cm^2 area and 0.226cm^3 volume. There is Fat Layer (Subcutaneous Tissue) Exposed exposed. There is no tunneling or undermining noted. There is a medium amount of serous drainage noted. The wound margin is flat and intact. There is small (1-33%) pink granulation within the wound bed. There is a small (1-33%) amount of necrotic tissue within the wound bed including Adherent Slough. The periwound skin appearance did not exhibit: Callus, Crepitus, Excoriation, Induration, Rash, Scarring, Dry/Scaly, Maceration, Atrophie Blanche, Cyanosis, Ecchymosis, Hemosiderin Staining, Mottled, Pallor, Rubor, Erythema. Periwound temperature was noted as No Abnormality. Wound #2 status is Open. Original cause of wound was Gradually Appeared. The wound is located on  the Left,Lateral,Posterior Lower Leg. The wound measures 0.1cm length x 0.1cm width x 0.1cm depth; 0.008cm^2 area and 0.001cm^3 volume. There is Fat Layer (Subcutaneous Tissue) Exposed exposed. There is no tunneling or undermining noted. There is a small amount of serous drainage noted. The wound margin is indistinct and nonvisible. There is no granulation within the wound bed. There is a small (1-33%) amount of necrotic tissue within the wound bed including Adherent Slough. The periwound skin appearance did not exhibit: Callus, Crepitus, Excoriation, Induration, Rash, Scarring, Dry/Scaly, Maceration, Atrophie Blanche, Cyanosis, Ecchymosis, Hemosiderin Staining, Mottled, Pallor, Rubor, Erythema. Periwound temperature was noted as No Abnormality. Assessment Active Problems ICD-10 Non-pressure chronic ulcer of left calf with necrosis of muscle Lymphedema, not elsewhere classified Chronic venous hypertension (idiopathic) with ulcer and inflammation of left lower extremity Type 2 diabetes mellitus with other skin ulcer Non-pressure chronic ulcer of right calf limited to breakdown of skin Diagnoses ICD-10 L97.223: Non-pressure chronic ulcer of left calf with necrosis of muscle I89.0: Lymphedema, not elsewhere classified I87.332: Chronic venous hypertension (idiopathic) with ulcer and inflammation of left lower extremity E11.622: Type 2 diabetes mellitus with other skin ulcer L97.211: Non-pressure chronic ulcer of right calf limited to breakdown of skin Plan Wound Cleansing: Kramme, Deadrick A. ([id number]) Wound #2 Left,Lateral,Posterior Lower Leg: Clean wound with Normal Saline. Cleanse wound with mild soap and water Anesthetic (add to Medication List): Wound #2 Left,Lateral,Posterior Lower Leg: Topical Lidocaine 4% cream applied to wound bed prior to debridement (In Clinic Only). Primary Wound Dressing: Wound #18 Left,Lateral Lower Leg: Silver Alginate - on all draining areas Wound #2  Left,Lateral,Posterior Lower Leg: Silver Alginate - on all draining areas Secondary Dressing: Wound #18 Left,Lateral Lower Leg: ABD pad Wound #2 Left,Lateral,Posterior Lower Leg: ABD pad Dressing Change Frequency: Wound #18 Left,Lateral Lower Leg: Change dressing every week Wound #2 Left,Lateral,Posterior Lower Leg: Change dressing every week - Wednesday Follow-up Appointments: Wound #18 Left,Lateral Lower Leg: Return Appointment in 2 weeks.  Wound #2 Left,Lateral,Posterior Lower Leg: Return Appointment in 2 weeks. Edema Control: Wound #18 Left,Lateral Lower Leg: 4-Layer Compression System - Left Lower Extremity. Patient to wear own compression stockings - Right Compression Pump: Use compression pump on left lower extremity for 30 minutes, twice daily. - 1 Hour three times daily Compression Pump: Use compression pump on right lower extremity for 30 minutes, twice daily. - 1 Hour three times daily Wound #2 Left,Lateral,Posterior Lower Leg: 4-Layer Compression System - Left Lower Extremity. Patient to wear own compression stockings - Right Compression Pump: Use compression pump on left lower extremity for 30 minutes, twice daily. - 1 Hour three times daily Compression Pump: Use compression pump on right lower extremity for 30 minutes, twice daily. - 1 Hour three times daily Home Health: Wound #18 Left,Lateral Lower Leg: Beulah [name] may visit PRN to address patient s wound care needs. FACE TO FACE ENCOUNTER: MEDICARE and MEDICAID PATIENTS: I certify that this patient is under my care and that I had a face-to-face encounter that meets the physician face-to-face encounter requirements with this patient on this date. The encounter with the patient was in whole or in part for the following MEDICAL CONDITION: (primary reason for [place]) MEDICAL NECESSITY: I certify, that based on my findings, NURSING services are a medically necessary home health  service. HOME BOUND STATUS: I certify that my clinical findings support that this patient is homebound (i.e., Due to illness or injury, pt requires aid of supportive devices such as crutches, cane, wheelchairs, walkers, the use of special transportation or the assistance of another person to leave their place of residence. There is a normal inability to leave the home and doing so requires considerable and taxing effort. Other absences are for medical reasons / religious services and are infrequent or of short duration when for other reasons). If current dressing causes regression in wound condition, may D/C ordered dressing product/s and apply Normal Saline Moist Dressing daily until next [place] / Other MD appointment. [place] of regression in wound condition at [phone]. Please direct any NON-WOUND related issues/requests for orders to patient's Primary Care Physician Wound #2 Left,Lateral,Posterior Lower Leg: [name] may visit PRN to address patient s wound care needs. FACE TO FACE ENCOUNTER: MEDICARE and MEDICAID PATIENTS: I certify that this patient is under my care and that I had a face-to-face encounter that meets the physician face-to-face encounter requirements with this patient on this date. The encounter with the patient was in whole or in part for the following MEDICAL CONDITION: (primary reason for Home Kouba, Napoleon A. ([id number]) Healthcare) MEDICAL NECESSITY: I certify, that based on my findings, NURSING services are a medically necessary home health service. HOME BOUND STATUS: I certify that my clinical findings support that this patient is homebound (i.e., Due to illness or injury, pt requires aid of supportive devices such as crutches, cane, wheelchairs, walkers, the use of special transportation or the assistance of another person to leave their place of residence. There is a normal inability to leave  the home and doing so requires considerable and taxing effort. Other absences are for medical reasons / religious services and are infrequent or of short duration when for other reasons). If current dressing causes regression in wound condition, may D/C ordered dressing product/s and apply Normal Saline Moist Dressing daily until next [place] / Other MD appointment. [place] of regression in wound  condition at [phone]. Please direct any NON-WOUND related issues/requests for orders to patient's Primary Care Physician 1. Silver alginate ABDs under 4-layer compression on the left. 2. The amount of edema in the left leg was really startling especially on the lateral calf. I wonder if he is being compliant with the pumps Electronic Signature(s) Signed: 10/25/2018 4:42:30 PM By: Linton Ham MD Entered By: Linton Ham on 10/25/2018 12:32:59 Sadowski, Steffon A. ([id number]) -------------------------------------------------------------------------------- SuperBill Details Patient Name: Kruckenberg, Braeton A. Date of Service: 10/25/2018 Medical Record Number: [id number] Patient Account Number: [id number] Date of Birth/Sex: [birth year] (48 y.o. M) Treating RN: Cornell Barman Primary Care Provider: Gaetano Net Other Clinician: Referring Provider: Gaetano Net Treating Provider/Extender: Tito Dine in Treatment: 42 Diagnosis Coding ICD-10 Codes Code Description [id number] Non-pressure chronic ulcer of left calf with necrosis of muscle I89.0 Lymphedema, not elsewhere classified I87.332 Chronic venous hypertension (idiopathic) with ulcer and inflammation of left lower extremity E11.622 Type 2 diabetes mellitus with other skin ulcer L97.211 Non-pressure chronic ulcer of right calf limited to breakdown of skin Facility Procedures CPT4 Code: [id number] Description: (Facility Use Only) [phone] - [place] LWR LT LEG Modifier: Quantity: 1 Physician  Procedures CPT4 Code: [id number] Description: 66440 - WC PHYS LEVEL 3 - EST PT ICD-10 Diagnosis Description L97.223 Non-pressure chronic ulcer of left calf with necrosis of m I89.0 Lymphedema, not elsewhere classified Modifier: uscle Quantity: 1 Electronic Signature(s) Signed: 10/25/2018 4:42:30 PM By: Linton Ham MD Previous Signature: 10/25/2018 12:02:01 PM Version By: Gretta Cool, BSN, RN, CWS, Kim RN, BSN Entered By: Linton Ham on 10/25/2018 12:33:39

## 2018-10-30 ENCOUNTER — Encounter: Payer: Self-pay | Admitting: *Deleted

## 2018-11-01 ENCOUNTER — Ambulatory Visit: Payer: BLUE CROSS/BLUE SHIELD | Admitting: Anesthesiology

## 2018-11-01 ENCOUNTER — Encounter
Admission: RE | Disposition: A | Discharge status: Home / Self Care | Payer: Self-pay | Source: Home / Self Care | Attending: Ophthalmology

## 2018-11-01 ENCOUNTER — Encounter: Payer: Self-pay | Admitting: Emergency Medicine

## 2018-11-01 ENCOUNTER — Ambulatory Visit
Admission: RE | Admit: 2018-11-01 | Discharge: 2018-11-01 | Disposition: A | Discharge status: Home / Self Care | Payer: BLUE CROSS/BLUE SHIELD | Attending: Ophthalmology | Admitting: Ophthalmology

## 2018-11-01 DIAGNOSIS — I10 Essential (primary) hypertension: Secondary | ICD-10-CM | POA: Insufficient documentation

## 2018-11-01 DIAGNOSIS — G4730 Sleep apnea, unspecified: Secondary | ICD-10-CM | POA: Diagnosis not present

## 2018-11-01 DIAGNOSIS — R0601 Orthopnea: Secondary | ICD-10-CM | POA: Insufficient documentation

## 2018-11-01 DIAGNOSIS — N189 Chronic kidney disease, unspecified: Secondary | ICD-10-CM | POA: Diagnosis not present

## 2018-11-01 DIAGNOSIS — H2512 Age-related nuclear cataract, left eye: Secondary | ICD-10-CM | POA: Diagnosis present

## 2018-11-01 DIAGNOSIS — E1122 Type 2 diabetes mellitus with diabetic chronic kidney disease: Secondary | ICD-10-CM | POA: Insufficient documentation

## 2018-11-01 DIAGNOSIS — Z87891 Personal history of nicotine dependence: Secondary | ICD-10-CM | POA: Insufficient documentation

## 2018-11-01 DIAGNOSIS — I129 Hypertensive chronic kidney disease with stage 1 through stage 4 chronic kidney disease, or unspecified chronic kidney disease: Secondary | ICD-10-CM | POA: Insufficient documentation

## 2018-11-01 DIAGNOSIS — F329 Major depressive disorder, single episode, unspecified: Secondary | ICD-10-CM | POA: Diagnosis not present

## 2018-11-01 DIAGNOSIS — E669 Obesity, unspecified: Secondary | ICD-10-CM | POA: Diagnosis not present

## 2018-11-01 DIAGNOSIS — Z888 Allergy status to other drugs, medicaments and biological substances status: Secondary | ICD-10-CM | POA: Insufficient documentation

## 2018-11-01 DIAGNOSIS — Z6841 Body Mass Index (BMI) 40.0 and over, adult: Secondary | ICD-10-CM | POA: Insufficient documentation

## 2018-11-01 DIAGNOSIS — E1136 Type 2 diabetes mellitus with diabetic cataract: Secondary | ICD-10-CM | POA: Diagnosis not present

## 2018-11-01 DIAGNOSIS — F419 Anxiety disorder, unspecified: Secondary | ICD-10-CM | POA: Diagnosis not present

## 2018-11-01 HISTORY — DX: Major depressive disorder, single episode, unspecified: F32.9

## 2018-11-01 HISTORY — PX: CATARACT EXTRACTION W/PHACO: SHX586

## 2018-11-01 HISTORY — DX: Personal history of other specified conditions: Z87.898

## 2018-11-01 HISTORY — DX: Anxiety disorder, unspecified: F41.9

## 2018-11-01 HISTORY — DX: Dyspnea, unspecified: R06.00

## 2018-11-01 HISTORY — DX: Cardiac arrhythmia, unspecified: I49.9

## 2018-11-01 LAB — POCT I-STAT 4, (NA,K, GLUC, HGB,HCT)
GLUCOSE: 122 mg/dL — AB (ref 70–99)
HCT: 29 % — ABNORMAL LOW (ref 39.0–52.0)
Hemoglobin: 9.9 g/dL — ABNORMAL LOW (ref 13.0–17.0)
Potassium: 4 mmol/L (ref 3.5–5.1)
Sodium: 141 mmol/L (ref 135–145)

## 2018-11-01 LAB — GLUCOSE, CAPILLARY: Glucose-Capillary: 120 mg/dL — ABNORMAL HIGH (ref 70–99)

## 2018-11-01 SURGERY — PHACOEMULSIFICATION, CATARACT, WITH IOL INSERTION
Anesthesia: Monitor Anesthesia Care | Site: Eye | Laterality: Left

## 2018-11-01 MED ORDER — SODIUM CHLORIDE 0.9 % IV SOLN
INTRAVENOUS | Status: DC
  Administered 2018-11-01: 10:00:00 via INTRAVENOUS

## 2018-11-01 MED ORDER — SODIUM HYALURONATE 23 MG/ML IO SOLN
INTRAOCULAR | Status: DC | PRN
  Administered 2018-11-01: 0.6 mL via INTRAOCULAR

## 2018-11-01 MED ORDER — TRIAMCINOLONE ACETONIDE 40 MG/ML IJ SUSP
INTRAMUSCULAR | Status: DC | PRN
  Administered 2018-11-01: 40 mg

## 2018-11-01 MED ORDER — MOXIFLOXACIN HCL 0.5 % OP SOLN
OPHTHALMIC | Status: AC
  Filled 2018-11-01: qty 3

## 2018-11-01 MED ORDER — LIDOCAINE HCL (PF) 4 % IJ SOLN
INTRAOCULAR | Status: DC | PRN
  Administered 2018-11-01: 4 mL via OPHTHALMIC

## 2018-11-01 MED ORDER — POVIDONE-IODINE 5 % OP SOLN
OPHTHALMIC | Status: DC | PRN
  Administered 2018-11-01: 1 via OPHTHALMIC

## 2018-11-01 MED ORDER — EPINEPHRINE PF 1 MG/ML IJ SOLN
INTRAMUSCULAR | Status: AC
  Filled 2018-11-01: qty 2

## 2018-11-01 MED ORDER — TETRACAINE HCL 0.5 % OP SOLN
OPHTHALMIC | Status: AC
  Administered 2018-11-01: 1 [drp] via OPHTHALMIC
  Filled 2018-11-01: qty 4

## 2018-11-01 MED ORDER — LIDOCAINE HCL (PF) 4 % IJ SOLN
INTRAMUSCULAR | Status: AC
  Filled 2018-11-01: qty 5

## 2018-11-01 MED ORDER — MIDAZOLAM HCL 2 MG/2ML IJ SOLN
INTRAMUSCULAR | Status: AC
  Filled 2018-11-01: qty 2

## 2018-11-01 MED ORDER — ARMC OPHTHALMIC DILATING DROPS
OPHTHALMIC | Status: AC
  Administered 2018-11-01: 1 via OPHTHALMIC
  Filled 2018-11-01: qty 0.5

## 2018-11-01 MED ORDER — FENTANYL CITRATE (PF) 100 MCG/2ML IJ SOLN
INTRAMUSCULAR | Status: DC | PRN
  Administered 2018-11-01 (×2): 25 ug via INTRAVENOUS
  Administered 2018-11-01: 50 ug via INTRAVENOUS

## 2018-11-01 MED ORDER — NEOMYCIN-POLYMYXIN-DEXAMETH 3.5-10000-0.1 OP OINT
TOPICAL_OINTMENT | OPHTHALMIC | Status: AC
  Filled 2018-11-01: qty 3.5

## 2018-11-01 MED ORDER — ARMC OPHTHALMIC DILATING DROPS
1.0000 "application " | OPHTHALMIC | Status: AC
  Administered 2018-11-01 (×2): 1 via OPHTHALMIC

## 2018-11-01 MED ORDER — POVIDONE-IODINE 5 % OP SOLN
OPHTHALMIC | Status: AC
  Filled 2018-11-01: qty 30

## 2018-11-01 MED ORDER — MOXIFLOXACIN HCL 0.5 % OP SOLN
OPHTHALMIC | Status: DC | PRN
  Administered 2018-11-01: 0.2 mL via OPHTHALMIC

## 2018-11-01 MED ORDER — BSS IO SOLN
INTRAOCULAR | Status: DC | PRN
  Administered 2018-11-01 (×2): 15 mL via INTRAOCULAR

## 2018-11-01 MED ORDER — NEOMYCIN-POLYMYXIN-DEXAMETH 3.5-10000-0.1 OP OINT
TOPICAL_OINTMENT | OPHTHALMIC | Status: DC | PRN
  Administered 2018-11-01: 1 via OPHTHALMIC

## 2018-11-01 MED ORDER — SODIUM HYALURONATE 10 MG/ML IO SOLN
INTRAOCULAR | Status: DC | PRN
  Administered 2018-11-01 (×2): 0.55 mL via INTRAOCULAR

## 2018-11-01 MED ORDER — TRIAMCINOLONE ACETONIDE 40 MG/ML IJ SUSP
INTRAMUSCULAR | Status: AC
  Filled 2018-11-01: qty 1

## 2018-11-01 MED ORDER — EPINEPHRINE PF 1 MG/ML IJ SOLN
INTRAOCULAR | Status: DC | PRN
  Administered 2018-11-01: 11:00:00 via OPHTHALMIC

## 2018-11-01 MED ORDER — MIDAZOLAM HCL 2 MG/2ML IJ SOLN
INTRAMUSCULAR | Status: DC | PRN
  Administered 2018-11-01 (×2): 1 mg via INTRAVENOUS

## 2018-11-01 MED ORDER — MOXIFLOXACIN HCL 0.5 % OP SOLN: 1.0000 [drp] | OPHTHALMIC | Status: DC | PRN

## 2018-11-01 MED ORDER — CARBACHOL 0.01 % IO SOLN
INTRAOCULAR | Status: DC | PRN
  Administered 2018-11-01: 0.5 mL via INTRAOCULAR

## 2018-11-01 MED ORDER — TETRACAINE HCL 0.5 % OP SOLN
1.0000 [drp] | OPHTHALMIC | Status: DC | PRN
  Administered 2018-11-01: 1 [drp] via OPHTHALMIC

## 2018-11-01 MED ORDER — SODIUM HYALURONATE 23 MG/ML IO SOLN
INTRAOCULAR | Status: AC
  Filled 2018-11-01: qty 0.6

## 2018-11-01 MED ORDER — FENTANYL CITRATE (PF) 100 MCG/2ML IJ SOLN
INTRAMUSCULAR | Status: AC
  Filled 2018-11-01: qty 2

## 2018-11-01 SURGICAL SUPPLY — 23 items
CANNULA ANT/CHMB 27GA (MISCELLANEOUS) ×3 IMPLANT
CUTTER IOL 19 GA PARKER CHANG (INSTRUMENTS) ×3 IMPLANT
DISSECTOR HYDRO NUCLEUS 50X22 (MISCELLANEOUS) ×3 IMPLANT
FILTER MILLEX .045 (MISCELLANEOUS) ×1 IMPLANT
FLTR MILLEX .045 (MISCELLANEOUS) ×3
GLOVE BIO SURGEON STRL SZ8 (GLOVE) ×3 IMPLANT
GLOVE BIOGEL M 6.5 STRL (GLOVE) ×3 IMPLANT
GLOVE SURG LX 7.5 STRW (GLOVE) ×2
GLOVE SURG LX STRL 7.5 STRW (GLOVE) ×1 IMPLANT
GOWN STRL REUS W/ TWL LRG LVL3 (GOWN DISPOSABLE) ×2 IMPLANT
GOWN STRL REUS W/TWL LRG LVL3 (GOWN DISPOSABLE) ×4
LABEL CATARACT MEDS ST (LABEL) ×3 IMPLANT
LENS IOL ACRSF MP 17.0 (Intraocular Lens) ×1 IMPLANT
LENS IOL ACRYSOF POST 17.0 (Intraocular Lens) ×3 IMPLANT
LENS IOL TECNIS ITEC 18.5 (Intraocular Lens) ×3 IMPLANT
PACK CATARACT (MISCELLANEOUS) ×3 IMPLANT
PACK CATARACT KING (MISCELLANEOUS) ×3 IMPLANT
PACK EYE AFTER SURG (MISCELLANEOUS) ×3 IMPLANT
SOL BSS BAG (MISCELLANEOUS) ×3
SOLUTION BSS BAG (MISCELLANEOUS) ×1 IMPLANT
SUT ETHILON 10 0 CS140 6 (SUTURE) ×3 IMPLANT
WATER STERILE IRR 250ML POUR (IV SOLUTION) ×3 IMPLANT
WIPE NON LINTING 3.25X3.25 (MISCELLANEOUS) ×3 IMPLANT

## 2018-11-01 NOTE — Op Note (Signed)
OPERATIVE NOTE  [name] [id number] 11/01/2018   PREOPERATIVE DIAGNOSIS:  Nuclear sclerotic cataract left eye.  H25.12   POSTOPERATIVE DIAGNOSIS:    Nuclear sclerotic cataract left eye.     PROCEDURE:   1.  Phacoemusification with posterior chamber intraocular lens placement of the left eye CPT [phone] 2.  Anterior vitrectomy CPT [id number]  LENS:   Implant Name Type Inv. Item Serial No. Manufacturer Lot No. LRB No. Used  LENS IOL DIOP 18.5 - [id number] Intraocular Lens LENS IOL DIOP 18.5 [id number]  Left 1  LENS IOL POST 17.0 - [id number] Intraocular Lens LENS IOL POST 17.0 [id number] ALCON  Left 1      MA60AC 17.0 D placed in sulcus    ULTRASOUND TIME: 0 minutes 41 seconds.  CDE 4.61   SURGEON:  Benay Pillow, MD, MPH   ANESTHESIA:  Topical with tetracaine drops augmented with 1% preservative-free intracameral lidocaine.  ESTIMATED BLOOD LOSS: <1 mL   COMPLICATIONS:  posterior capsular tear at the time of placement of IOL, loss of vitreous.   DESCRIPTION OF PROCEDURE:  The patient was identified in the holding room and transported to the operating room and placed in the supine position under the operating microscope.  The left eye was identified as the operative eye and it was prepped and draped in the usual sterile ophthalmic fashion.   A 1.0 millimeter clear-corneal paracentesis was made at the 5:00 position. 0.5 ml of preservative-free 1% lidocaine with epinephrine was injected into the anterior chamber.  The anterior chamber was filled with Healon 5 viscoelastic.  A 2.4 millimeter keratome was used to make a near-clear corneal incision at the 2:00 position.  A curvilinear capsulorrhexis was made with a cystotome and capsulorrhexis forceps.  Balanced salt solution was used to hydrodissect and hydrodelineate the nucleus.   Phacoemulsification was then used in stop and chop fashion to remove the lens nucleus and epinucleus.  The remaining cortex was then removed using the  irrigation and aspiration handpiece. Healon was then placed into the capsular bag to distend it for lens placement.  A lens was then injected into the capsular bag.  As the lens was being rotated a peripheral capsular tear was noted.  The lens was tilting nasally slightly, so the decision was made to remove the lens.  The wounds were enlarged, kenalog was injected in the [facility] and the vitrector was used to remove vitreous from the anterior chamber.  The duet packer/chang IOL cutters were used to bisect the single piece acrylic and remove it from the eye without difficulty.    A 3 piece lens was injected into the sulcus without difficulty.  This was well centered.  Again a vitrectomy was performed to remove residual vitreous.     A 10-0 nylon suture was placed through the main temporal wound.  Wounds were hydrated with balanced salt solution.  The anterior chamber was inflated to a physiologic pressure with balanced salt solution.  Miostat was injected.  The pupil was round.    Intracameral vigamox 0.1 mL undiltued was injected into the eye and a drop placed onto the ocular surface.    No wound leaks were noted.  Maxitrol ointment and a patch and shield were placed.  The patient was taken to the recovery room in stable condition.  Benay Pillow 11/01/2018, 12:06 PM

## 2018-11-01 NOTE — Anesthesia Postprocedure Evaluation (Signed)
Anesthesia Post Note  Patient: [name]  Procedure(s) Performed: CATARACT EXTRACTION PHACO AND INTRAOCULAR LENS PLACEMENT (IOC)-LEFT, DIABETIC-INSULIN DEPENDENT (Left Eye)  Patient location during evaluation: Phase II Anesthesia Type: MAC Level of consciousness: awake and alert and oriented Pain management: pain level controlled Vital Signs Assessment: post-procedure vital signs reviewed and stable Respiratory status: spontaneous breathing, nonlabored ventilation and respiratory function stable Cardiovascular status: blood pressure returned to baseline and stable Postop Assessment: no signs of nausea or vomiting Anesthetic complications: no     Last Vitals:  Vitals:   11/01/18 0928 11/01/18 1208  BP: (!) 164/71 (!) 159/65  Pulse: (!) 53 (!) 47  Resp: 17 18  Temp: 36.7 C 36.6 C  SpO2: 100% 99%    Last Pain:  Vitals:   11/01/18 1208  TempSrc: Temporal  PainSc: 0-No pain                 Tawan Corkern

## 2018-11-01 NOTE — H&P (Signed)

## 2018-11-01 NOTE — Discharge Instructions (Signed)

## 2018-11-01 NOTE — Anesthesia Post-op Follow-up Note (Signed)
Anesthesia QCDR form completed.        

## 2018-11-01 NOTE — Anesthesia Preprocedure Evaluation (Signed)
Anesthesia Evaluation  Patient identified by MRN, date of birth, ID band Patient awake    Reviewed: Allergy & Precautions, NPO status , Patient's Chart, lab work & pertinent test results  History of Anesthesia Complications Negative for: history of anesthetic complications  Airway Mallampati: III  TM Distance: >3 FB Neck ROM: Full    Dental  (+) Poor Dentition   Pulmonary sleep apnea (told he needed a CPAP but didn't get it ) , neg COPD, former smoker,    breath sounds clear to auscultation- rhonchi (-) wheezing      Cardiovascular hypertension, Pt. on medications (-) CAD, (-) Past MI, (-) Cardiac Stents and (-) CABG  Rhythm:Regular Rate:Normal - Systolic murmurs and - Diastolic murmurs    Neuro/Psych neg Seizures PSYCHIATRIC DISORDERS Anxiety Depression negative neurological ROS     GI/Hepatic negative GI ROS, Neg liver ROS,   Endo/Other  diabetes, Insulin Dependent  Renal/GU CRFRenal disease (has fistula but has not started dialysis yet)     Musculoskeletal negative musculoskeletal ROS (+)   Abdominal (+) + obese,   Peds  Hematology negative hematology ROS (+)   Anesthesia Other Findings Past Medical History: No date: Anxiety No date: Chronic kidney disease     Comment:  14% FUNCTION No date: Depression No date: Diabetes mellitus without complication (HCC) No date: Dyspnea     Comment:  DOE No date: Dysrhythmia No date: History of orthopnea No date: Hypertension No date: Lymphedema of leg No date: Microalbuminuria No date: Sleep apnea     Comment:  no CPAP No date: Vitamin D deficiency   Reproductive/Obstetrics                             Anesthesia Physical Anesthesia Plan  ASA: III  Anesthesia Plan: MAC   Post-op Pain Management:    Induction: Intravenous  PONV Risk Score and Plan: 1 and Midazolam  Airway Management Planned: Natural Airway  Additional Equipment:    Intra-op Plan:   Post-operative Plan:   Informed Consent: I have reviewed the patients History and Physical, chart, labs and discussed the procedure including the risks, benefits and alternatives for the proposed anesthesia with the patient or authorized representative who has indicated his/her understanding and acceptance.       Plan Discussed with: CRNA and Anesthesiologist  Anesthesia Plan Comments:         Anesthesia Quick Evaluation

## 2018-11-01 NOTE — Transfer of Care (Signed)
Immediate Anesthesia Transfer of Care Note  Patient: [name]  Procedure(s) Performed: CATARACT EXTRACTION PHACO AND INTRAOCULAR LENS PLACEMENT (IOC)-LEFT, DIABETIC-INSULIN DEPENDENT (Left Eye)  Patient Location: PACU  Anesthesia Type:General  Level of Consciousness: awake  Airway & Oxygen Therapy: Patient spontaneously breathing on room air  Post-op Assessment: Report given to RN and Post -op Vital signs reviewed and stable  Post vital signs: Reviewed and stable  Last Vitals:  Vitals Value Taken Time  BP 159/65 11/01/2018 12:08 PM  Temp 36.6 C 11/01/2018 12:08 PM  Pulse 47 11/01/2018 12:08 PM  Resp 18 11/01/2018 12:08 PM  SpO2 99 % 11/01/2018 12:08 PM    Last Pain:  Vitals:   11/01/18 1208  TempSrc: Temporal  PainSc: 0-No pain         Complications: No apparent anesthesia complications

## 2018-11-03 ENCOUNTER — Encounter (INDEPENDENT_AMBULATORY_CARE_PROVIDER_SITE_OTHER): Payer: BLUE CROSS/BLUE SHIELD

## 2018-11-03 ENCOUNTER — Ambulatory Visit (INDEPENDENT_AMBULATORY_CARE_PROVIDER_SITE_OTHER): Payer: BLUE CROSS/BLUE SHIELD | Admitting: Nurse Practitioner

## 2018-11-06 ENCOUNTER — Other Ambulatory Visit: Payer: Self-pay

## 2018-11-06 ENCOUNTER — Ambulatory Visit (INDEPENDENT_AMBULATORY_CARE_PROVIDER_SITE_OTHER): Payer: BLUE CROSS/BLUE SHIELD | Admitting: Vascular Surgery

## 2018-11-06 ENCOUNTER — Encounter (INDEPENDENT_AMBULATORY_CARE_PROVIDER_SITE_OTHER): Payer: Self-pay | Admitting: Vascular Surgery

## 2018-11-06 VITALS — BP 173/77 | HR 58 | Resp 12 | Ht 76.0 in | Wt 393.0 lb

## 2018-11-06 DIAGNOSIS — N186 End stage renal disease: Secondary | ICD-10-CM

## 2018-11-06 DIAGNOSIS — Z87891 Personal history of nicotine dependence: Secondary | ICD-10-CM

## 2018-11-06 DIAGNOSIS — I890 Lymphedema, not elsewhere classified: Secondary | ICD-10-CM

## 2018-11-06 DIAGNOSIS — E119 Type 2 diabetes mellitus without complications: Secondary | ICD-10-CM | POA: Diagnosis not present

## 2018-11-06 DIAGNOSIS — I10 Essential (primary) hypertension: Secondary | ICD-10-CM

## 2018-11-06 DIAGNOSIS — I120 Hypertensive chronic kidney disease with stage 5 chronic kidney disease or end stage renal disease: Secondary | ICD-10-CM | POA: Diagnosis not present

## 2018-11-06 DIAGNOSIS — I872 Venous insufficiency (chronic) (peripheral): Secondary | ICD-10-CM | POA: Diagnosis not present

## 2018-11-08 ENCOUNTER — Other Ambulatory Visit: Payer: Self-pay

## 2018-11-08 ENCOUNTER — Encounter: Payer: BLUE CROSS/BLUE SHIELD | Attending: Internal Medicine | Admitting: Internal Medicine

## 2018-11-08 DIAGNOSIS — I129 Hypertensive chronic kidney disease with stage 1 through stage 4 chronic kidney disease, or unspecified chronic kidney disease: Secondary | ICD-10-CM | POA: Diagnosis not present

## 2018-11-08 DIAGNOSIS — N184 Chronic kidney disease, stage 4 (severe): Secondary | ICD-10-CM | POA: Diagnosis not present

## 2018-11-08 DIAGNOSIS — E1122 Type 2 diabetes mellitus with diabetic chronic kidney disease: Secondary | ICD-10-CM | POA: Diagnosis not present

## 2018-11-08 DIAGNOSIS — I890 Lymphedema, not elsewhere classified: Secondary | ICD-10-CM | POA: Diagnosis not present

## 2018-11-08 DIAGNOSIS — Z09 Encounter for follow-up examination after completed treatment for conditions other than malignant neoplasm: Secondary | ICD-10-CM | POA: Insufficient documentation

## 2018-11-08 DIAGNOSIS — Z992 Dependence on renal dialysis: Secondary | ICD-10-CM | POA: Diagnosis not present

## 2018-11-08 DIAGNOSIS — Z872 Personal history of diseases of the skin and subcutaneous tissue: Secondary | ICD-10-CM | POA: Diagnosis not present

## 2018-11-09 NOTE — Progress Notes (Signed)
[name], [name] ([id number]) Visit Report for 11/08/2018 HPI Details Patient Name: [name], [name] Date of Service: 11/08/2018 12:30 PM Medical Record Number: [id number] Patient Account Number: [id number] Date of Birth/Sex: [birth year] (48 y.o. M) Treating RN: Cornell Barman Primary Care Provider: Gaetano Net Other Clinician: Referring Provider: Gaetano Net Treating Provider/Extender: Tito Dine in Treatment: 74 History of Present Illness HPI Description: 12/28/17; this is a now 48 year old man who is a type II diabetic. He was hospitalized from 10/01/17 through 10/19/17. He had an MSSA soft tissue and skin infection. 2 open areas on the left leg were identified he has a smaller area on the left medial calf superiorly just below the knee and a wound just above the left ankle on the posterior medial aspect. I think both of these were surgical IandD sites when he was in the hospital. He was discharged with a wound VAC at that point however this is since been taken off. He follows with Dr. Ola Spurr for the [facility] and he is still on chronic Keflex at 500 twice a day. At that time he was hospitalized his hemoglobin A1c was 15.1 however if I'm reading his endocrinologist notes correctly that is improved. He has been following with Dr. [name] at vein [facility] vascular and he has been applying calcium alginate and Unna boots. He has home health changing the dressing. They have also been attempting to get him external compression pumps although the patient is unaware whether they've been approved by insurance at this point. as mentioned he has a smaller clean wound on the right lateral calf just below the knee and he has a much larger area just above the left ankle medially and posteriorly. Our intake nurse reported greenish purulent looking drainage.the patient did have surgical material sent to pathology in February. This showed chronic abscess The patient also has lymphedema stage III in the left  greater than right lower extremities. He has a history of blisters with wounds but these of all were always healed. The patient thinks that the lymphedema may have been present since he was about 48 years old i.e. about 30 years. He does not have graded pressure stockings and has not worn stockings. He does not have a distant history of DVT PE or phlebitis. He has not been systemically unwell fever no chills. He states that his Lasix is recently been reduced. He tells me his kidney function is at "30%" and he has been followed by Dr. [name] of nephrology. At one point he was on Lasix 80 twice a day however that's been cut back and he is now on Lasix at 20 twice a day. The patient has a history of PAD listed in his records although he comes from Dr. [name] I don't think is felt to have significant PAD. ABIs in our clinic were noncompressible bilaterally. 01/04/18; patient has a large wound on the left lateral lower calf and a small wound on the left medial upper calf. He has been to see his nephrologist who changed him to Demadex 40 mg a day. I'm hopeful this will help with his systemic fluid overload. He also has stage III lymphedema. Really no change in the 2 wounds since last week 01/11/18; the patient is down 13 pounds. He put his stage III lymphedema left leg in 4K compression last week and there is less edema fluid however we still haven't been able to communicate with home health but apparently it is [facility] but the dressings have not been changed. The  patient noted an odor last week. He is also had compression pumps ordered by [facility] this as a not completed the paperwork stage. 01/18/18; patient continues to lose weight. Stage III lymphedema in the left greater than right leg under for alert compression. The major wound is on the left lateral ankle area. He apparently has bilateral compression pumps being brought to his house, these were ordered by [facility] Notable for the fact today he had some blisters on the right anterior leg together with some skin nodules. This is no doubt secondary to severe lymphedema. 01/25/18; the patient has obtained his compression pumps and is using them per vein and vascular instructions 3 times a day for an hour. He also saw [name] of vein and vascular. He was felt to have venous insufficiency but did not suggest any intervention also improved edema. It was suggested that he have compression stockings 20-30 mm on a daily basis in addition to compression pumps. [name], [name] ([id number]) The patient arrives in clinic today with a layer of unna under for layer compression. he seems to have some trouble with the degree of compression. He has open areas on the left lateral ankle area which is his major wound left upper medial calf and a superficial open area on the right anterior shin area which was blistered last week. He has skin changes on the right anterior calf which I think are no doubt secondary to lymphedema skin nodules etc. 02/01/18; the patient comes in telling us his nephrologist have to his torsemide. Unfortunately today he is put on 7 pounds by our scales. He has blisters all over the anterior and medial part of his right calf and a new open wound. He also has soupy green drainage coming out of the left lateral calf /ankle wound. 02/08/18; culture I did last week of the left lateral ankle wound grew both Pseudomonas and Morganella. He is on Keflex from Dr. Ola Spurr in the hospital. I will need to review these notes.in any case Keflex is not going to cover these 2 organisms. I'm probably going to added ciprofloxacin today for 1 week. A lot of drainage that looks purulent last week. He is not complaining of pain however he has managed to put on 10 pounds in 2 weeks by our scales in this clinic. He is going to see his nephrologist tomorrow 02/15/18; he completed the ciprofloxacin I gave him last week.  Notable that he is up to 379 pounds today which is up 16 pounds from 2 weeks ago. He is complaining of orthopnea but doesn't have any chest pain. 02/22/18; he continues to have weight gain. R intake nurse reports again purulent green drainage coming out of the lateral wound on the lateral left calf. He has small open area on the right anterior leg. His torsemide was increased to 2 tablets a day I believe this is 40 mg last week in response to the call admitted to Dr. Keturah Barre office. He follows up with Dr. [name] and Dr. Delana Meyer tomorrow 03/01/18 his weight essentially stable today at 383 pounds. Drainage out of the left lateral wound on the lower left calf/ankle is a lot less. Culture last time grew Pseudomonas. I put him on cefdinir. He has been to Dr. Keturah Barre office no adjustments in his diuretics. Dr. Nicoletta Dress prescribed a wraparound stocking for the right leg.there is no open area on the right leg. He has a superficial area on the left medial calf, left posterior  calf and in the large area on the left lateral however this looks better 03/15/18; weight is not up to 393 pounds. He saw his nephrologist yesterday Dr. [name] will increase the Demadex I'm hopeful this will help with the edema control. I'm using silver alginate to all his wounds. In particular the left lateral ankle looks better. oUnfortunately he has new open areas on the right lateral calf that will include use of his compression stocking at least in the short to medium term. He has new wounds o3 on the right lateral calf. One of these has some size however all numerous superficial 03/22/18; his weight is stabilized a bit. Just adjustment of his diuretics by his nephrologist. There is no doubt he has some degree of systemic fluid overload on top of severe left greater than right lymphedema. 2 weeks ago tried to transition him to stockings on the right leg however he developed re-breakdown of skin on the right leg and we had to put him back  in compression last week. He also uses external compression pumps and claims to be compliant Continued concern about his depression today 03/29/18; several ongoing issues with this patient; oHe no longer has home health coverage apparently secondary to a lapse in insurance. He is apparently transitioning from short for long-term disability. [facility] was changing his compression wraps on Monday and Friday. oHe has gained 10 pounds since last week oSees Dr. [name] tomorrow oSaw his primary doctor last week about the depression. It sounds as though he declined pharmacologic management. He seems somewhat better today. We were concerned last week when he came. Somewhat better today oHe is using his compression pumps once a day at home, I have asked for twice a day if possible especially on the left leg oFollows up with his nephrologist in mid-August. He is managing his diuretic for I think stage IV chronic renal failure oParadoxically his wounds actually look better 04/19/18; the patient has not been seen since I last saw him 3 weeks ago. He saw Dr. Delana Meyer of vascular surgery on 03/30/18 I believe he put him in a 20/30 stocking bilaterally with a wraparound extremitease stocking. He has not been putting anything specifically on the wound. More problematic than that he has not been wearing the stockings he is at home. He has been using his external compression pumps once per day according to him on a rare occasion twice On a psychosocial level the patient is now on long-term disability and is applying for COBRA therefore he is between insurances. He has not been able to follow up with Dr. [name] who is his nephrologist as a result. As noted his weight is up to 407 pounds today. He promises me he'll follow-up with Dr. [name] 05/03/18; he hasn't been here in 2 weeks now. He apparently has been wearing a compression sock on the left leg. Massive increase in edema 3 large open wounds on the left anterior  leg that were probably blisters. Significant deterioration in the left lateral ankle wound that we've been doing as his most problematic wound. He still does not have his insurance issues wrapped up. His weight is well over 400 pounds. 05/10/18; arrives today with better looking edema control in the left leg. He has been using his compression pumps twice a day. We also wrapped it is left leg for there he's been using his pumps so there is much better edema control. Most of new Lough, Davontay A. ([id number]) wounds from last week look a  lot better. Even the refractory area on the lower left lateral ankle looks a lot better to me today. He follows up with Dr. [name] this morning [nephrology] 05/17/18 patient arrives today with a lot less edema in the left leg. His weight is gone down 7 pounds. He tells me he saw Dr. [name] but his torsemide was not adjusted. He is using the palms 3 times a day. There is been quite an improvement in the remaining wound on the left lateral ankle oWeekly visit for follow-up of bilateral lower extremity wounds related to severe lymphedema and probably some degree of systemic fluid overload from chronic renal failure stage IV. His weight is up this week to over 400 pounds. He noted increasing edema in the right leg late last week. He took his compression stocking off he did not increase the frequency of this compression pump use. He developed a large blister on the back of the right calf. He also has a new opened blister on the left lateral calf in addition to the wound that we've been using on the distal left lower calf area and we've been using silver alginate. He tells me that he has an ultrasound which is a DVT rule out and I think a reflux study ordered by Dr. [name] 05/31/18; weekly visit. He went to see vein and vascular this week apparently they remove the 4-layer compression on the left and put an Unna boot on him in replacement. He's got more swelling in the left leg  is resolved. That being said his left lower Wound is better. He still has a fairly large wound on the right posterior calf this was not disturbed. He has a follow-up appointment with Dr. Keturah Barre nephrologist next week 06/07/2018; the patient arrives today with a history that he took both his compression wraps off 3 days ago in order to take a shower. He has bilateral severe tense blisters. A lot of weeping erythema especially on the lateral left calf. Almost circumferential blisters on the right. Multiple areas of epithelial breakdown. He does not complain of any pain fever chills. He states he has been using his compression pump in some form of stocking that I could not really determine the type. He did not come into the clinic with anything on his legs. He tells me he has an appointment with Dr. Merita Norton his nephrologist I believe this Friday. Paradoxically his weight is actually down to 385 pounds I believe last week he was over 400 06/14/18; arrives with better looking wound surfaces today on both legs. There is no further blistering however there are still areas that aren't epithelialized on the right anterior, right lateral, left posterior and left medial. His original wound just above the left ankle laterally is still open moist. His weight was about the same today. He tells me that his nephrologist increase the torsemide from 40/20/09/1939/40 twice a day 06/21/18; both his wraps fell down to his mid calf. Has a result he has multiple blisters across the anterior right leg above with the wraps ultimately watched. He also has blisters on the posterior part of the left calf o2. His original wound on the left lateral calf is hard to see any open area. There is however a divot. Which is going to be difficult to deal with into the future until the edema in the left leg is controlled. 2 small superficial areas remain He tells [place] his weight was 386 pounds on his scale at home. According to our scale he  is up 5 pounds. He is not on a fluid restriction 06/28/18; patient's weight is gone up 2 pounds since last time. He has weeping edema and open superficial wounds on the right posterior right lateral and right anterior calf. He has a small open area on the left anterior probably left posterior calf and the original wound on the left lateral calf appears to be just about closed He is being planned for a dialysis shunt in the right arm through [facility] incoordination with his nephrologist Dr. [name] He claims to be using his compression pumps twice a day. He has lymphedema and no doubt systemic fluid volume overload from stage IV chronic renal failure 07/04/18; patient's weight is up into the 396 range. He is supposed to see his cardiologist tomorrow and plans are being made for a shunt by Dr. Delana Meyer. He still has significant open wounds/draining areas on the right anterior and right posterior calf. Several small open areas on the left which are less in terms of wound area on the right which is surprising given the fact the left is the larger most lymphedematous leg 07/26/2018 She is seen today for follow-up and management of lateral posterior lower leg wound and bilateral lymphoedema. He has a very flat affect and depressed mood today. Unable to elicit much information from him today. No thoughts of harm to self identified. Recently had a follow-up with vascular vein for fistula placement to initiate dialysis in the right arm. He has 2 dressings on the right arm from the fistula procedure with no bleeding or drainage noted on the dressing. He does have a large amount of bruising in the surrounding to puncture areas on the right lower arm from the fistula placement. No pain elicited with palpation of the right arm. He denies a diminished sensation in that right lower arm as well. He does have a small wound to the left lateral posterior lower leg. No visual drainage present to either leg.  However the wound to the left lower leg does have a foul odor even after cleaning it. The left is the larger most lymphedematous leg with the right Leg still having a significant amount of edema. No drainage or blisters present on either leg today. He states that he is using the lymphedema pumps twice a day. Not too sure if he is compliant with actually using the lymphedema pumps based on the amount of edema that he has in his legs. Weight increased from 396.1 to 401.6. No recent fevers, chills, or shortness of breath. 08/02/18; the patient only has one remaining wound on the left lateral calf which is still open. This is in the resultant [facility] [place]. ([id number]) shaped area which was once the site of the major wound. He does not have any open areas on the right leg nor additional wounds on the left no blistering. As usual the left leg is much larger than the right. He comes in today stating that he took the wraps off on the right leg on Friday and since then he has presumably been wearing his stocking which is a wraparound variant stocking on the right. States he took the 4-layer compression off his left leg this morning to shower. He is seeing vein and vascular tomorrow. He states he is using his compression pumps once or twice a day. He also admits that he has been on his feet a lot 08/09/18; the patient has the left leg healed except for the original wound site on  the left medial Just above the ankle. On the right he has a new wound on the right lateral calf. He saw vein and vascular last week and he is been back in his pressure stockings and wraparound stockings. He also is been put on metolazone 3 times a week by nephrology along with his torsemide. I'm hopeful that this will help with some of the lower extremity edema. I've always felt that he has lymphedema but there may be a secondary component contributing 08/16/18; the patient used his own junk slight stockings to both legs last  week. He claims he is pumping once to twice a day at home. Here rise with his legs looking visibly less edematous. His usual left greater than right secondary to lymphedema. He is tolerating the metolazone 3 times a week along with the torsemide. His weight was down 2 pounds. He follows up with nephrology soon for follow-up lab work I think in early January. He does not have any open areas on the right leg he still has the crevice on the lateral left calf just above the ankle there is 2 small open areas here I am not sure that this is ever going to be free of an open area although certainly not worsening. More problematic lead he has 2 blistered areas just above the wrist on the left lateral calf 09/06/2018; the patient arrives with much larger legs than I remember seeing a month ago especially on the right. He has 1 open area on the left lateral calf and he had a 4 layer compression on this on arrival in the clinic. This was apparently put on by [facility] in response to a new open wound. He claims he is pumping once or more often twice a day although I really have a hard time believing it. He came in with nothing but stockings on his legs. There is tremendous increase in the edema bilaterally 1/22; the patient only has the one area that is not totally closed and that is the divot on the left lateral calf. I do not think we are going to be able to do anything further to this unless he loses edema fluid in his left leg when he starts dialysis which I gather is sometime soon. He is using his own wraparound compression garment. He is using the compression pumps twice a day. 2/19; the patient does not have any open wounds on the right leg but both legs left and the right have extensive edema is usually worse on the left. He has a tense blister on the left anterior leg. Using his compression pumps usually once a day per his description. He does not come in with the compression stockings 2/26. Right leg  remains without any open wounds that he has his own stocking. The tense blister on the left anterior leg is closed over. He still has the open area on the left lateral mid calf and then the divot injury area which I do not think is going to heal just above the left ankle. 3/11; the patient arrives with all the wounds on both legs healed. He has his stocking with the wraparound juxta lite on the right. He has 1 for the left. He initiated dialysis on Monday and we will see what effect that has on his lower extremity edema. He has his compression pumps and he plans to use them at least once a day Electronic Signature(s) Signed: 11/08/2018 5:42:07 PM By: Linton Ham MD Entered By: Linton Ham on 11/08/2018  13:18:58 Wojtkiewicz, Shaheim A. ([id number]) -------------------------------------------------------------------------------- Physical Exam Details Patient Name: Bouffard, [name] Date of Service: 11/08/2018 12:30 PM Medical Record Number: [id number] Patient Account Number: [id number] Date of Birth/Sex: 1970-09-18 (48 y.o. M) Treating RN: Cornell Barman Primary Care Provider: Gaetano Net Other Clinician: Referring Provider: Gaetano Net Treating Provider/Extender: Tito Dine in Treatment: 61 Constitutional Patient is hypertensive.. Pulse regular and within target range for patient.[name] Respirations regular, non-labored and within target range.. Temperature is normal and within the target range for the patient.[name] appears in no distress. Eyes Conjunctivae clear. No discharge. Respiratory Respiratory effort is easy and symmetric bilaterally. Rate is normal at rest and on room air.. Cardiovascular Pedal pulses palpable on the left. Lymphatic None palpable in the popliteal area bilaterally. Integumentary (Hair, Skin) Lymphedema in both legs nonpitting. No other primary skin conditions.[name] Psychiatric No evidence of depression, anxiety, or agitation. Calm, cooperative, and communicative.  Appropriate interactions and affect.. Notes Wound exam; the patient has healed his areas on the left lateral calf. The divot he has just above his ankle was also a lot less shallow with initiation of dialysis. He has no open wounds in either leg. He continues to have a lot more edema in the left leg than the right which is nonpitting Electronic Signature(s) Signed: 11/08/2018 5:42:07 PM By: Linton Ham MD Entered By: Linton Ham on 11/08/2018 13:20:36 Micheletti, Kamdin A. ([id number]) -------------------------------------------------------------------------------- Physician Orders Details Patient Name: Pertuit, Iven A. Date of Service: 11/08/2018 12:30 PM Medical Record Number: [id number] Patient Account Number: [id number] Date of Birth/Sex: 1971-08-14 (48 y.o. M) Treating RN: Cornell Barman Primary Care Provider: Gaetano Net Other Clinician: Referring Provider: Gaetano Net Treating Provider/Extender: Tito Dine in Treatment: 73 Verbal / Phone Orders: No Diagnosis Coding Discharge From [facility] o Discharge from [facility] complete Electronic Signature(s) Signed: 11/08/2018 5:23:21 PM By: Gretta Cool, BSN, RN, CWS, Kim RN, BSN Signed: 11/08/2018 5:42:07 PM By: Linton Ham MD Entered By: Gretta Cool, BSN, RN, CWS, Kim on 11/08/2018 13:15:34 Engelstad, Beacher AMarland Kitchen ([id number]) -------------------------------------------------------------------------------- Problem List Details Patient Name: Haaland, Jariel A. Date of Service: 11/08/2018 12:30 PM Medical Record Number: [id number] Patient Account Number: [id number] Date of Birth/Sex: 19-Nov-1970 (48 y.o. 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 ([id number]) -------------------------------------------------------------------------------- Progress Note Details Patient Name: Thissen, Sirron A. Date of Service: 11/08/2018 12:30 PM Medical Record Number: [id number] Patient Account Number: [id number] Date of Birth/Sex: Jul 05, 1971 (48 y.o. M) Treating RN: Cornell Barman Primary Care Provider: Gaetano Net Other Clinician: Referring Provider: Gaetano Net Treating Provider/Extender: Tito Dine in Treatment: 45 Subjective History of Present Illness (HPI) 12/28/17; this is a now 48 year old man who is a type II diabetic. He was hospitalized from 10/01/17 through 10/19/17. He had an MSSA soft tissue and skin infection. 2 open areas on the left  leg were identified he has a smaller area on the left medial calf superiorly just below the knee and a wound just above the left ankle on the posterior medial aspect. I think both of these were surgical IandD sites when he was in the hospital. He was discharged with a wound VAC at that point however this is since been taken off. He follows with Dr. Ola Spurr for the [facility] and he is still on chronic Keflex at 500 twice a day. At that time he was hospitalized his hemoglobin A1c was 15.1 however if I'm reading his endocrinologist notes correctly that is improved. He has been following with Dr. [name] at vein and vascular and he has been  applying calcium alginate and Unna boots. He has home health changing the dressing. They have also been attempting to get him external compression pumps although the patient is unaware whether they've been approved by insurance at this point. as mentioned he has a smaller clean wound on the right lateral calf just below the knee and he has a much larger area just above the left ankle medially and posteriorly. Our intake nurse reported greenish purulent looking drainage.the patient did have surgical material sent to pathology in February. This showed chronic abscess The patient also has lymphedema stage III in the left greater than right lower extremities. He has a history of blisters with wounds but these of all were always healed. The patient thinks that the lymphedema may have been present since he was about 48 years old i.e. about 30 years. He does not have graded pressure stockings and has not worn stockings. He does not have a distant history of DVT PE or phlebitis. He has not been systemically unwell fever no chills. He states that his Lasix is recently been reduced. He tells me his kidney function is at "30%" and he has been followed by Dr. [name] of nephrology. At one point he was on Lasix 80 twice a day however that's been cut back and he is now on Lasix at 20 twice a day. The patient has a history of PAD listed in his records although he comes from Dr. [name] I don't think is felt to have significant PAD. ABIs in our clinic were noncompressible bilaterally. 01/04/18; patient has a large wound on the left lateral lower calf and a small wound on the left medial upper calf. He has been to see his nephrologist who changed him to Demadex 40 mg a day. I'm hopeful this will help with his systemic fluid overload. He also has stage III lymphedema. Really no change in the 2 wounds since last week 01/11/18; the patient is down 13 pounds. He put his stage III lymphedema left leg in 4K compression last  week and there is less edema fluid however we still haven't been able to communicate with home health but apparently it is [facility] but the dressings have not been changed. The patient noted an odor last week. He is also had compression pumps ordered by [facility] this as a not completed the paperwork stage. 01/18/18; patient continues to lose weight. Stage III lymphedema in the left greater than right leg under for alert compression. The major wound is on the left lateral ankle area. He apparently has bilateral compression pumps being brought to his house, these were ordered by [facility] Notable for the fact today he had some blisters on the right anterior leg together with some skin nodules. This is no doubt secondary to  severe lymphedema. 01/25/18; the patient has obtained his compression pumps and is using them per vein and vascular instructions 3 times a day for an hour. He also saw [name] of vein and vascular. He was felt to have venous insufficiency but did not suggest any intervention also improved edema. It was suggested that he have compression stockings 20-30 mm on a daily basis in addition to compression pumps. The patient arrives in clinic today with a layer of unna under for layer compression. he seems to have some trouble with the degree of compression. He has open areas on the left lateral ankle area which is his major wound left upper medial calf and a Fenner, Scotti A. ([id number]) superficial open area on the right anterior shin area which was blistered last week. He has skin changes on the right anterior calf which I think are no doubt secondary to lymphedema skin nodules etc. 02/01/18; the patient comes in telling us his nephrologist have to his torsemide. Unfortunately today he is put on 7 pounds by our scales. He has blisters all over the anterior and medial part of his right calf and a new open wound. He also has soupy green drainage coming out of  the left lateral calf /ankle wound. 02/08/18; culture I did last week of the left lateral ankle wound grew both Pseudomonas and Morganella. He is on Keflex from Dr. Ola Spurr in the hospital. I will need to review these notes.in any case Keflex is not going to cover these 2 organisms. I'm probably going to added ciprofloxacin today for 1 week. A lot of drainage that looks purulent last week. He is not complaining of pain however he has managed to put on 10 pounds in 2 weeks by our scales in this clinic. He is going to see his nephrologist tomorrow 02/15/18; he completed the ciprofloxacin I gave him last week. Notable that he is up to 379 pounds today which is up 16 pounds from 2 weeks ago. He is complaining of orthopnea but doesn't have any chest pain. 02/22/18; he continues to have weight gain. R intake nurse reports again purulent green drainage coming out of the lateral wound on the lateral left calf. He has small open area on the right anterior leg. His torsemide was increased to 2 tablets a day I believe this is 40 mg last week in response to the call admitted to Dr. Keturah Barre office. He follows up with Dr. [name] and Dr. Delana Meyer tomorrow 03/01/18 his weight essentially stable today at 383 pounds. Drainage out of the left lateral wound on the lower left calf/ankle is a lot less. Culture last time grew Pseudomonas. I put him on cefdinir. He has been to Dr. Keturah Barre office no adjustments in his diuretics. Dr. Nicoletta Dress prescribed a wraparound stocking for the right leg.there is no open area on the right leg. He has a superficial area on the left medial calf, left posterior calf and in the large area on the left lateral however this looks better 03/15/18; weight is not up to 393 pounds. He saw his nephrologist yesterday Dr. [name] will increase the Demadex I'm hopeful this will help with the edema control. I'm using silver alginate to all his wounds. In particular the left lateral ankle looks  better. Unfortunately he has new open areas on the right lateral calf that will include use of his compression stocking at least in the short to medium term. He has new wounds o3 on the right lateral calf. One of these has some  size however all numerous superficial 03/22/18; his weight is stabilized a bit. Just adjustment of his diuretics by his nephrologist. There is no doubt he has some degree of systemic fluid overload on top of severe left greater than right lymphedema. 2 weeks ago tried to transition him to stockings on the right leg however he developed re-breakdown of skin on the right leg and we had to put him back in compression last week. He also uses external compression pumps and claims to be compliant Continued concern about his depression today 03/29/18; several ongoing issues with this patient; He no longer has home health coverage apparently secondary to a lapse in insurance. He is apparently transitioning from short for long-term disability. [facility] was changing his compression wraps on Monday and Friday. He has gained 10 pounds since last week Sees Dr. [name] tomorrow Saw his primary doctor last week about the depression. It sounds as though he declined pharmacologic management. He seems somewhat better today. We were concerned last week when he came. Somewhat better today He is using his compression pumps once a day at home, I have asked for twice a day if possible especially on the left leg Follows up with his nephrologist in mid-August. He is managing his diuretic for I think stage IV chronic renal failure Paradoxically his wounds actually look better 04/19/18; the patient has not been seen since I last saw him 3 weeks ago. He saw Dr. Delana Meyer of vascular surgery on 03/30/18 I believe he put him in a 20/30 stocking bilaterally with a wraparound extremitease stocking. He has not been putting anything specifically on the wound. More problematic than that he has not been  wearing the stockings he is at home. He has been using his external compression pumps once per day according to him on a rare occasion twice On a psychosocial level the patient is now on long-term disability and is applying for COBRA therefore he is between insurances. He has not been able to follow up with Dr. [name] who is his nephrologist as a result. As noted his weight is up to 407 pounds today. He promises me he'll follow-up with Dr. [name] 05/03/18; he hasn't been here in 2 weeks now. He apparently has been wearing a compression sock on the left leg. Massive increase in edema 3 large open wounds on the left anterior leg that were probably blisters. Significant deterioration in the left lateral ankle wound that we've been doing as his most problematic wound. He still does not have his insurance issues wrapped up. His weight is well over 400 pounds. 05/10/18; arrives today with better looking edema control in the left leg. He has been using his compression pumps twice a day. We also wrapped it is left leg for there he's been using his pumps so there is much better edema control. Most of new wounds from last week look a lot better. Even the refractory area on the lower left lateral ankle looks a lot better to me today. He follows up with Dr. [name] this morning [nephrology] 05/17/18 patient arrives today with a lot less edema in the left leg. His weight is gone down 7 pounds. He tells me he saw Dr. Helene Kelp, Ilya A. ([id number]) [name] but his torsemide was not adjusted. He is using the palms 3 times a day. There is been quite an improvement in the remaining wound on the left lateral ankle Weekly visit for follow-up of bilateral lower extremity wounds related to severe lymphedema and probably some  degree of systemic fluid overload from chronic renal failure stage IV. His weight is up this week to over 400 pounds. He noted increasing edema in the right leg late last week. He took his compression stocking  off he did not increase the frequency of this compression pump use. He developed a large blister on the back of the right calf. He also has a new opened blister on the left lateral calf in addition to the wound that we've been using on the distal left lower calf area and we've been using silver alginate. He tells me that he has an ultrasound which is a DVT rule out and I think a reflux study ordered by Dr. [name] 05/31/18; weekly visit. He went to see vein and vascular this week apparently they remove the 4-layer compression on the left and put an Unna boot on him in replacement. He's got more swelling in the left leg is resolved. That being said his left lower Wound is better. He still has a fairly large wound on the right posterior calf this was not disturbed. He has a follow-up appointment with Dr. Keturah Barre nephrologist next week 06/07/2018; the patient arrives today with a history that he took both his compression wraps off 3 days ago in order to take a shower. He has bilateral severe tense blisters. A lot of weeping erythema especially on the lateral left calf. Almost circumferential blisters on the right. Multiple areas of epithelial breakdown. He does not complain of any pain fever chills. He states he has been using his compression pump in some form of stocking that I could not really determine the type. He did not come into the clinic with anything on his legs. He tells me he has an appointment with Dr. Merita Norton his nephrologist I believe this Friday. Paradoxically his weight is actually down to 385 pounds I believe last week he was over 400 06/14/18; arrives with better looking wound surfaces today on both legs. There is no further blistering however there are still areas that aren't epithelialized on the right anterior, right lateral, left posterior and left medial. His original wound just above the left ankle laterally is still open moist. His weight was about the same today. He tells me that  his nephrologist increase the torsemide from 40/20/09/1939/40 twice a day 06/21/18; both his wraps fell down to his mid calf. Has a result he has multiple blisters across the anterior right leg above with the wraps ultimately watched. He also has blisters on the posterior part of the left calf o2. His original wound on the left lateral calf is hard to see any open area. There is however a divot. Which is going to be difficult to deal with into the future until the edema in the left leg is controlled. 2 small superficial areas remain He tells [place] his weight was 386 pounds on his scale at home. According to our scale he is up 5 pounds. He is not on a fluid restriction 06/28/18; patient's weight is gone up 2 pounds since last time. He has weeping edema and open superficial wounds on the right posterior right lateral and right anterior calf. He has a small open area on the left anterior probably left posterior calf and the original wound on the left lateral calf appears to be just about closed He is being planned for a dialysis shunt in the right arm through [facility] incoordination with his nephrologist Dr. [name] He claims to be using his compression pumps twice  a day. He has lymphedema and no doubt systemic fluid volume overload from stage IV chronic renal failure 07/04/18; patient's weight is up into the 396 range. He is supposed to see his cardiologist tomorrow and plans are being made for a shunt by Dr. Delana Meyer. He still has significant open wounds/draining areas on the right anterior and right posterior calf. Several small open areas on the left which are less in terms of wound area on the right which is surprising given the fact the left is the larger most lymphedematous leg 07/26/2018 She is seen today for follow-up and management of lateral posterior lower leg wound and bilateral lymphoedema. He has a very flat affect and depressed mood today. Unable to elicit much information from  him today. No thoughts of harm to self identified. Recently had a follow-up with vascular vein for fistula placement to initiate dialysis in the right arm. He has 2 dressings on the right arm from the fistula procedure with no bleeding or drainage noted on the dressing. He does have a large amount of bruising in the surrounding to puncture areas on the right lower arm from the fistula placement. No pain elicited with palpation of the right arm. He denies a diminished sensation in that right lower arm as well. He does have a small wound to the left lateral posterior lower leg. No visual drainage present to either leg. However the wound to the left lower leg does have a foul odor even after cleaning it. The left is the larger most lymphedematous leg with the right Leg still having a significant amount of edema. No drainage or blisters present on either leg today. He states that he is using the lymphedema pumps twice a day. Not too sure if he is compliant with actually using the lymphedema pumps based on the amount of edema that he has in his legs. Weight increased from 396.1 to 401.6. No recent fevers, chills, or shortness of breath. 08/02/18; the patient only has one remaining wound on the left lateral calf which is still open. This is in the resultant [place] shaped area which was once the site of the major wound. He does not have any open areas on the right leg nor additional wounds on the left no blistering. As usual the left leg is much larger than the right. Sabas, Ezri A. ([id number]) He comes in today stating that he took the wraps off on the right leg on Friday and since then he has presumably been wearing his stocking which is a wraparound variant stocking on the right. States he took the 4-layer compression off his left leg this morning to shower. He is seeing vein and vascular tomorrow. He states he is using his compression pumps once or twice a day. He also admits that he has been on his  feet a lot 08/09/18; the patient has the left leg healed except for the original wound site on the left medial Just above the ankle. On the right he has a new wound on the right lateral calf. He saw vein and vascular last week and he is been back in his pressure stockings and wraparound stockings. He also is been put on metolazone 3 times a week by nephrology along with his torsemide. I'm hopeful that this will help with some of the lower extremity edema. I've always felt that he has lymphedema but there may be a secondary component contributing 08/16/18; the patient used his own junk slight stockings to both legs last week. He  claims he is pumping once to twice a day at home. Here rise with his legs looking visibly less edematous. His usual left greater than right secondary to lymphedema. He is tolerating the metolazone 3 times a week along with the torsemide. His weight was down 2 pounds. He follows up with nephrology soon for follow-up lab work I think in early January. He does not have any open areas on the right leg he still has the crevice on the lateral left calf just above the ankle there is 2 small open areas here I am not sure that this is ever going to be free of an open area although certainly not worsening. More problematic lead he has 2 blistered areas just above the wrist on the left lateral calf 09/06/2018; the patient arrives with much larger legs than I remember seeing a month ago especially on the right. He has 1 open area on the left lateral calf and he had a 4 layer compression on this on arrival in the clinic. This was apparently put on by [facility] in response to a new open wound. He claims he is pumping once or more often twice a day although I really have a hard time believing it. He came in with nothing but stockings on his legs. There is tremendous increase in the edema bilaterally 1/22; the patient only has the one area that is not totally closed and that is the divot on  the left lateral calf. I do not think we are going to be able to do anything further to this unless he loses edema fluid in his left leg when he starts dialysis which I gather is sometime soon. He is using his own wraparound compression garment. He is using the compression pumps twice a day. 2/19; the patient does not have any open wounds on the right leg but both legs left and the right have extensive edema is usually worse on the left. He has a tense blister on the left anterior leg. Using his compression pumps usually once a day per his description. He does not come in with the compression stockings 2/26. Right leg remains without any open wounds that he has his own stocking. The tense blister on the left anterior leg is closed over. He still has the open area on the left lateral mid calf and then the divot injury area which I do not think is going to heal just above the left ankle. 3/11; the patient arrives with all the wounds on both legs healed. He has his stocking with the wraparound juxta lite on the right. He has 1 for the left. He initiated dialysis on Monday and we will see what effect that has on his lower extremity edema. He has his compression pumps and he plans to use them at least once a day Objective Constitutional Patient is hypertensive.. Pulse regular and within target range for patient.[name] Respirations regular, non-labored and within target range.. Temperature is normal and within the target range for the patient.[name] appears in no distress. Vitals Time Taken: 12:47 PM, Height: 76 in, Weight: 397 lbs, BMI: 48.3, Temperature: 98.0 F, Pulse: 56 bpm, Respiratory Rate: 18 breaths/min, Blood Pressure: 171/63 mmHg. Eyes Conjunctivae clear. No discharge. Respiratory Respiratory effort is easy and symmetric bilaterally. Rate is normal at rest and on room air.[name] Wedin, Sergio A. ([id number]) Cardiovascular Pedal pulses palpable on the left. Lymphatic None palpable in the popliteal  area bilaterally. Psychiatric No evidence of depression, anxiety, or agitation. Calm, cooperative, and communicative.  Appropriate interactions and affect.. General Notes: Wound exam; the patient has healed his areas on the left lateral calf. The divot he has just above his ankle was also a lot less shallow with initiation of dialysis. He has no open wounds in either leg. He continues to have a lot more edema in the left leg than the right which is nonpitting Integumentary (Hair, Skin) Lymphedema in both legs nonpitting. No other primary skin conditions.. Wound #18 status is Healed - Epithelialized. Original cause of wound was Gradually Appeared. The wound is located on the Left,Lateral Lower Leg. The wound measures 0cm length x 0cm width x 0cm depth; 0cm^2 area and 0cm^3 volume. There is no tunneling or undermining noted. There is a none present amount of drainage noted. The wound margin is flat and intact. There is small (1-33%) pink granulation within the wound bed. There is no necrotic tissue within the wound bed. The periwound skin appearance did not exhibit: Callus, Crepitus, Excoriation, Induration, Rash, Scarring, Dry/Scaly, Maceration, Atrophie Blanche, Cyanosis, Ecchymosis, Hemosiderin Staining, Mottled, Pallor, Rubor, Erythema. Periwound temperature was noted as No Abnormality. Wound #2 status is Healed - Epithelialized. Original cause of wound was Gradually Appeared. The wound is located on the Left,Lateral,Posterior Lower Leg. The wound measures 0cm length x 0cm width x 0cm depth; 0cm^2 area and 0cm^3 volume. There is Fat Layer (Subcutaneous Tissue) Exposed exposed. There is no tunneling or undermining noted. There is a small amount of serous drainage noted. The wound margin is indistinct and nonvisible. There is no granulation within the wound bed. There is a small (1-33%) amount of necrotic tissue within the wound bed including Adherent Slough. The periwound skin appearance did not  exhibit: Callus, Crepitus, Excoriation, Induration, Rash, Scarring, Dry/Scaly, Maceration, Atrophie Blanche, Cyanosis, Ecchymosis, Hemosiderin Staining, Mottled, Pallor, Rubor, Erythema. Periwound temperature was noted as No Abnormality. Assessment Active Problems ICD-10 Non-pressure chronic ulcer of left calf with necrosis of muscle Lymphedema, not elsewhere classified Chronic venous hypertension (idiopathic) with ulcer and inflammation of left lower extremity Type 2 diabetes mellitus with other skin ulcer Non-pressure chronic ulcer of right calf limited to breakdown of skin Plan Discharge From [facility] Services: Discharge from [place] ([id number]) 1. The patient can be discharged to his own compression stockings and external compression pumps. 2. He has initiated dialysis and I think this might help with his overall fluid volume status and to some extent the edema in his left greater than right legs 3. He has lymphedema and understands the necessity of stockings and compression pumps Electronic Signature(s) Signed: 11/08/2018 5:42:07 PM By: Linton Ham MD Entered By: Linton Ham on 11/08/2018 13:21:47 Gayden, Jabin A. ([id number]) -------------------------------------------------------------------------------- SuperBill Details Patient Name: Colantuono, [name] Date of Service: 11/08/2018 Medical Record Number: [id number] Patient Account Number: [id number] Date of Birth/Sex: 07-Aug-1971 (48 y.o. M) Treating RN: Cornell Barman Primary Care Provider: Gaetano Net Other Clinician: Referring Provider: Gaetano Net Treating Provider/Extender: Tito Dine in Treatment: 45 Diagnosis Coding ICD-10 Codes Code Description [phone] Non-pressure chronic ulcer of left calf with necrosis of muscle I89.0 Lymphedema, not elsewhere classified I87.332 Chronic venous hypertension (idiopathic) with ulcer and inflammation of left lower  extremity E11.622 Type 2 diabetes mellitus with other skin ulcer L97.211 Non-pressure chronic ulcer of right calf limited to breakdown of skin Facility Procedures CPT4 Code: [id number] Description: 93235 - WOUND CARE VISIT-LEV 2 EST PT Modifier: Quantity: 1 Physician Procedures CPT4: Description Modifier Quantity Code [id number] - WC PHYS LEVEL 3 -  EST PT 1 ICD-10 Diagnosis Description L97.223 Non-pressure chronic ulcer of left calf with necrosis of muscle I87.332 Chronic venous hypertension (idiopathic) with ulcer and  inflammation of left lower extremity Electronic Signature(s) Signed: 11/08/2018 5:42:07 PM By: Linton Ham MD Entered By: Linton Ham on 11/08/2018 13:22:07

## 2018-11-12 ENCOUNTER — Encounter (INDEPENDENT_AMBULATORY_CARE_PROVIDER_SITE_OTHER): Payer: Self-pay | Admitting: Vascular Surgery

## 2018-11-12 NOTE — Progress Notes (Signed)
MRN : [id number]  Chanel A Ernsberger is a 48 y.o. ([birth year]) male who presents with chief complaint of  Chief Complaint  Patient presents with   Follow-up  .  History of Present Illness: The patient returns to the office for followup of their dialysis access. The function of the access has been stable.  The patient denies hand pain or other symptoms consistent with steal phenomena.  No significant arm swelling.  The patient denies redness or swelling at the access site. The patient denies fever or chills at home or while on dialysis.  The patient denies amaurosis fugax or recent TIA symptoms. There are no recent neurological changes noted. The patient denies claudication symptoms or rest pain symptoms. The patient denies history of DVT, PE or superficial thrombophlebitis. The patient denies recent episodes of angina or shortness of breath.        Current Meds  Medication Sig   allopurinol (ZYLOPRIM) 100 MG tablet Take 100 mg by mouth daily.   amLODipine (NORVASC) 10 MG tablet Take 10 mg by mouth daily.    Ascorbic Acid (VITAMIN C WITH ROSE HIPS) 1000 MG tablet Take 1,000 mg by mouth daily.   atenolol (TENORMIN) 100 MG tablet Take 1 tablet (100 mg total) by mouth daily.   cloNIDine (CATAPRES) 0.1 MG tablet Take 0.1 mg by mouth 2 (two) times daily.    ferrous gluconate (FERGON) 240 (27 FE) MG tablet Take 240 mg by mouth 2 (two) times a week.    Insulin Glargine (LANTUS SOLOSTAR) 100 UNIT/ML Solostar Pen Inject 12 Units into the skin every evening.    Melatonin 5 MG CAPS Take 5 mg by mouth at bedtime as needed (sleep).   metolazone (ZAROXOLYN) 5 MG tablet Take 5 mg by mouth every Monday, Wednesday, and Friday.    Multiple Vitamin (MULTIVITAMIN WITH MINERALS) TABS tablet Take 1 tablet by mouth daily. Libido max otc supplement   Multiple Vitamins-Minerals (EYE HEALTH PO) Take 1 capsule by mouth daily.    Omega-3 1000 MG CAPS Take 1,000 mg by mouth daily.   torsemide  (DEMADEX) 20 MG tablet Take 40 mg by mouth 2 (two) times daily.     Past Medical History:  Diagnosis Date   Anxiety    Chronic kidney disease    14% FUNCTION   Depression    Diabetes mellitus without complication ([place])    Dyspnea    DOE   Dysrhythmia    History of orthopnea    Hypertension    Lymphedema of leg    Microalbuminuria    Sleep apnea    no CPAP   Vitamin D deficiency     Past Surgical History:  Procedure Laterality Date   A/V FISTULAGRAM Right 10/24/2018   Procedure: A/V FISTULAGRAM;  Surgeon: Katha Cabal, MD;  Location: [facility];  Service: Cardiovascular;  Laterality: Right;   APPLICATION OF WOUND VAC Left 10/03/2017   Procedure: APPLICATION OF WOUND VAC;  Surgeon: Algernon Huxley, MD;  Location: [facility];  Service: General;  Laterality: Left;   APPLICATION OF WOUND VAC Left 10/11/2017   Procedure: APPLICATION OF WOUND VAC;  Surgeon: Katha Cabal, MD;  Location: [facility];  Service: Vascular;  Laterality: Left;   APPLICATION OF WOUND VAC Left 10/14/2017   Procedure: WOUND VAC CHANGE;  Surgeon: Katha Cabal, MD;  Location: [facility];  Service: Vascular;  Laterality: Left;  left lower leg   APPLICATION OF WOUND VAC Left 10/18/2017   Procedure:  WOUND VAC CHANGE;  Surgeon: Katha Cabal, MD;  Location: [facility];  Service: Vascular;  Laterality: Left;   APPLICATION OF WOUND VAC Left 10/07/2017   Procedure: APPLICATION OF WOUND VAC;  Surgeon: Katha Cabal, MD;  Location: [facility];  Service: Vascular;  Laterality: Left;   AV FISTULA INSERTION W/ RF MAGNETIC GUIDANCE Right 07/19/2018   Procedure: AV FISTULA INSERTION W/RF MAGNETIC GUIDANCE;  Surgeon: Katha Cabal, MD;  Location: [facility];  Service: Cardiovascular;  Laterality: Right;   CATARACT EXTRACTION W/PHACO Left 11/01/2018   Procedure: CATARACT EXTRACTION PHACO AND INTRAOCULAR LENS PLACEMENT (IOC)-LEFT, DIABETIC-INSULIN DEPENDENT;  Surgeon: Eulogio Bear, MD;  Location: [facility];  Service: Ophthalmology;  Laterality: Left;  [place] 00:41.8 CDE 4.61 Fluid Pack Lot # [id number]   I&D EXTREMITY Left 10/11/2017   Procedure: IRRIGATION AND DEBRIDEMENT EXTREMITY;  Surgeon: Katha Cabal, MD;  Location: [facility];  Service: Vascular;  Laterality: Left;   INCISION AND DRAINAGE ABSCESS Right 10/07/2017   Procedure: INCISION AND DRAINAGE ABSCESS;  Surgeon: Katha Cabal, MD;  Location: [facility];  Service: Vascular;  Laterality: Right;   IRRIGATION AND DEBRIDEMENT ABSCESS Left 10/03/2017   Procedure: IRRIGATION AND DEBRIDEMENT ABSCESS with debridement of skin, soft tissue, muscle 50sq cm;  Surgeon: Algernon Huxley, MD;  Location: [facility];  Service: General;  Laterality: Left;    Social History Social History   Tobacco Use   Smoking status: Former Smoker    Types: Cigars   Smokeless tobacco: Former Systems developer    Quit date: 09/30/2017   Tobacco comment: occasional  Substance Use Topics   Alcohol use: Yes    Alcohol/week: 1.0 standard drinks    Types: 1 Cans of beer per week    Comment: beer   Drug use: Not Currently    Family History Family History  Problem Relation Age of Onset   Diabetes Father    Kidney disease Father    Diabetes Daughter     Allergies  Allergen Reactions   Lisinopril Swelling   Adhesive [Tape] Itching   Furosemide Other (See Comments)    Light headed, cough, blurry vision, weakness.     REVIEW OF SYSTEMS (Negative unless checked)  Constitutional: [] Weight loss  [] Fever  [] Chills Cardiac: [] Chest pain   [] Chest pressure   [] Palpitations   [] Shortness of breath when laying flat   [] Shortness of breath with exertion. Vascular:  [] Pain in legs with walking   [] Pain in legs at rest  [] History of DVT   [] Phlebitis   [x] Swelling in legs   [] Varicose veins   [x] Non-healing ulcers Pulmonary:   [] Uses home oxygen   [] Productive cough   [] Hemoptysis   [] Wheeze  [] COPD   [] Asthma Neurologic:  [] Dizziness    [] Seizures   [] History of stroke   [] History of TIA  [] Aphasia   [] Vissual changes   [] Weakness or numbness in arm   [] Weakness or numbness in leg Musculoskeletal:   [] Joint swelling   [] Joint pain   [] Low back pain Hematologic:  [] Easy bruising  [] Easy bleeding   [] Hypercoagulable state   [] Anemic Gastrointestinal:  [] Diarrhea   [] Vomiting  [] Gastroesophageal reflux/heartburn   [] Difficulty swallowing. Genitourinary:  [x] Chronic kidney disease   [] Difficult urination  [] Frequent urination   [] Blood in urine Skin:  [x] Rashes   [x] Ulcers  Psychological:  [] History of anxiety   []  History of major depression.  Physical Examination  Vitals:   11/06/18 1154  BP: (!) 173/77  Pulse: (!) 58  Resp: 12  Weight: (!) 393 lb (178.3 kg)  Height: 6\' 4"  (1.93 m)   Body mass index is 47.84 kg/m. Gen: WD/WN, NAD Head: [place]/AT, No temporalis wasting.  Ear/Nose/Throat: Hearing grossly intact, nares w/o erythema or drainage Eyes: PER, EOMI, sclera nonicteric.  Neck: Supple, no large masses.   Pulmonary:  Good air movement, no audible wheezing bilaterally, no use of accessory muscles.  Cardiac: RRR, no JVD Vascular: right arm good thrill and good bruit Vessel Right Left  Radial Palpable Palpable  Brachial Palpable Palpable  PT Not Palpable Not Palpable  DP Trace Palpable Trace Palpable  Gastrointestinal: Non-distended. No guarding/no peritoneal signs.  Musculoskeletal: M/S 5/5 throughout.  No deformity or atrophy.  Neurologic: CN 2-12 intact. Symmetrical.  Speech is fluent. Motor exam as listed above. Psychiatric: Judgment intact, Mood & affect appropriate for pt's clinical situation. Dermatologic: severe venous rashes + ulcers noted.  No changes consistent with cellulitis. Lymph : No lichenification or skin changes of chronic lymphedema.  CBC Lab Results  Component Value Date   WBC 5.9 07/14/2018   HGB 9.9 (L) 11/01/2018   HCT 29.0 (L) 11/01/2018   MCV 89.3 07/14/2018   PLT 193 07/14/2018     BMET    Component Value Date/Time   NA 141 11/01/2018 0959   K 4.0 11/01/2018 0959   CL 111 07/14/2018 1352   CO2 26 07/14/2018 1352   GLUCOSE 122 (H) 11/01/2018 0959   BUN 73 (H) 07/14/2018 1352   CREATININE 4.54 (H) 07/14/2018 1352   CALCIUM 8.5 (L) 07/14/2018 1352   GFRNONAA 14 (L) 07/14/2018 1352   GFRAA 16 (L) 07/14/2018 1352   CrCl cannot be calculated (Patient's most recent lab result is older than the maximum 21 days allowed.).  COAG Lab Results  Component Value Date   INR 1.09 07/14/2018   INR 1.05 10/14/2017   INR 1.16 10/06/2017    Radiology No results found.    Assessment/Plan 1. End stage renal disease ([place]) Recommend:  The patient is doing well and currently has adequate dialysis access. The patient's dialysis center is not reporting any access issues. Flow pattern is stable when compared to the prior ultrasound.  OK to begin cannulation  The patient should have a duplex ultrasound of the dialysis access in 6 months. The patient will follow-up with me in the office after each ultrasound    2. Chronic venous insufficiency No surgery or intervention at this point in time.    I have had a long discussion with the patient regarding venous insufficiency and why it  causes symptoms. I have discussed with the patient the chronic skin changes that accompany venous insufficiency and the long term sequela such as infection and ulceration.  Patient will begin wearing graduated compression stockings class 1 (20-30 mmHg) or compression wraps on a daily basis a prescription was given. The patient will put the stockings on first thing in the morning and removing them in the evening. The patient is instructed specifically not to sleep in the stockings.    In addition, behavioral modification including several periods of elevation of the lower extremities during the day will be continued. I have demonstrated that proper elevation is a position with the ankles at  heart level.  The patient is instructed to begin routine exercise, especially walking on a daily basis  Continue compression and using the lymph pump  Continue to follow with [place]  3. Essential hypertension Continue antihypertensive medications as already ordered, these medications have  been reviewed and there are no changes at this time.   4. Diabetes mellitus without complication ([place]) Continue hypoglycemic medications as already ordered, these medications have been reviewed and there are no changes at this time.  Hgb A1C to be monitored as already arranged by primary service   5. Lymphedema  No surgery or intervention at this point in time.    I have reviewed my discussion with the patient regarding lymphedema and why it  causes symptoms.  Patient will continue wearing graduated compression stockings class 1 (20-30 mmHg) on a daily basis a prescription was given. The patient is reminded to put the stockings on first thing in the morning and removing them in the evening. The patient is instructed specifically not to sleep in the stockings.   In addition, behavioral modification throughout the day will be continued.  This will include frequent elevation (such as in a recliner), use of over the counter pain medications as needed and exercise such as walking.  I have reviewed systemic causes for chronic edema such as liver, kidney and cardiac etiologies and there does not appear to be any significant changes in these organ systems over the past year.  The patient is under the impression that these organ systems are all stable and unchanged.    The patient will continue aggressive use of the  lymph pump.  This will continue to improve the edema control and prevent sequela such as ulcers and infections.   The patient will follow-up with me on an annual basis.      [name], MD  11/12/2018 2:43 PM

## 2018-11-13 ENCOUNTER — Other Ambulatory Visit (INDEPENDENT_AMBULATORY_CARE_PROVIDER_SITE_OTHER): Payer: Self-pay | Admitting: Nurse Practitioner

## 2018-11-23 ENCOUNTER — Other Ambulatory Visit (INDEPENDENT_AMBULATORY_CARE_PROVIDER_SITE_OTHER): Payer: Self-pay | Admitting: Nurse Practitioner

## 2018-11-23 ENCOUNTER — Encounter (INDEPENDENT_AMBULATORY_CARE_PROVIDER_SITE_OTHER): Payer: Self-pay

## 2018-11-23 ENCOUNTER — Telehealth (INDEPENDENT_AMBULATORY_CARE_PROVIDER_SITE_OTHER): Payer: Self-pay

## 2018-11-23 NOTE — Telephone Encounter (Signed)
Spoke with the patient and gave him the pre-procedure instructions regarding his fistulagram with Dr. Delana Meyer on 11/24/2018. Hospital restrictions were also discussed as well. This information will be faxed to [facility].

## 2018-11-24 ENCOUNTER — Encounter
Admission: RE | Disposition: A | Discharge status: Home / Self Care | Payer: Self-pay | Source: Home / Self Care | Attending: Vascular Surgery

## 2018-11-24 ENCOUNTER — Other Ambulatory Visit: Payer: Self-pay

## 2018-11-24 ENCOUNTER — Other Ambulatory Visit (INDEPENDENT_AMBULATORY_CARE_PROVIDER_SITE_OTHER): Payer: Self-pay | Admitting: Nurse Practitioner

## 2018-11-24 ENCOUNTER — Ambulatory Visit
Admission: RE | Admit: 2018-11-24 | Discharge: 2018-11-24 | Disposition: A | Discharge status: Home / Self Care | Payer: BLUE CROSS/BLUE SHIELD | Attending: Vascular Surgery | Admitting: Vascular Surgery

## 2018-11-24 DIAGNOSIS — E1122 Type 2 diabetes mellitus with diabetic chronic kidney disease: Secondary | ICD-10-CM | POA: Diagnosis not present

## 2018-11-24 DIAGNOSIS — Y832 Surgical operation with anastomosis, bypass or graft as the cause of abnormal reaction of the patient, or of later complication, without mention of misadventure at the time of the procedure: Secondary | ICD-10-CM | POA: Insufficient documentation

## 2018-11-24 DIAGNOSIS — I120 Hypertensive chronic kidney disease with stage 5 chronic kidney disease or end stage renal disease: Secondary | ICD-10-CM | POA: Insufficient documentation

## 2018-11-24 DIAGNOSIS — T82510A Breakdown (mechanical) of surgically created arteriovenous fistula, initial encounter: Secondary | ICD-10-CM | POA: Insufficient documentation

## 2018-11-24 DIAGNOSIS — F419 Anxiety disorder, unspecified: Secondary | ICD-10-CM | POA: Diagnosis not present

## 2018-11-24 DIAGNOSIS — F329 Major depressive disorder, single episode, unspecified: Secondary | ICD-10-CM | POA: Insufficient documentation

## 2018-11-24 DIAGNOSIS — G4730 Sleep apnea, unspecified: Secondary | ICD-10-CM | POA: Diagnosis not present

## 2018-11-24 DIAGNOSIS — F17290 Nicotine dependence, other tobacco product, uncomplicated: Secondary | ICD-10-CM | POA: Insufficient documentation

## 2018-11-24 DIAGNOSIS — Z992 Dependence on renal dialysis: Secondary | ICD-10-CM | POA: Diagnosis not present

## 2018-11-24 DIAGNOSIS — E559 Vitamin D deficiency, unspecified: Secondary | ICD-10-CM | POA: Diagnosis not present

## 2018-11-24 DIAGNOSIS — N186 End stage renal disease: Secondary | ICD-10-CM | POA: Insufficient documentation

## 2018-11-24 DIAGNOSIS — Z79899 Other long term (current) drug therapy: Secondary | ICD-10-CM

## 2018-11-24 DIAGNOSIS — T82898A Other specified complication of vascular prosthetic devices, implants and grafts, initial encounter: Secondary | ICD-10-CM

## 2018-11-24 HISTORY — PX: A/V FISTULAGRAM: CATH118298

## 2018-11-24 LAB — GLUCOSE, CAPILLARY
Glucose-Capillary: 144 mg/dL — ABNORMAL HIGH (ref 70–99)
Glucose-Capillary: 101 mg/dL — ABNORMAL HIGH (ref 70–99)

## 2018-11-24 LAB — POTASSIUM (ARMC VASCULAR LAB ONLY): Potassium (ARMC vascular lab): 4.7 (ref 3.5–5.1)

## 2018-11-24 SURGERY — A/V FISTULAGRAM
Anesthesia: Moderate Sedation | Laterality: Right

## 2018-11-24 MED ORDER — METHYLPREDNISOLONE SODIUM SUCC 125 MG IJ SOLR: 125.0000 mg | Freq: Once | INTRAMUSCULAR | Status: DC | PRN

## 2018-11-24 MED ORDER — LIDOCAINE HCL (PF) 1 % IJ SOLN
INTRAMUSCULAR | Status: DC | PRN
  Administered 2018-11-24: 5 mL via SUBCUTANEOUS

## 2018-11-24 MED ORDER — FAMOTIDINE 20 MG PO TABS: 40.0000 mg | ORAL_TABLET | Freq: Once | ORAL | Status: DC | PRN

## 2018-11-24 MED ORDER — HEPARIN (PORCINE) IN NACL 1000-0.9 UT/500ML-% IV SOLN
INTRAVENOUS | Status: AC
  Filled 2018-11-24: qty 1000

## 2018-11-24 MED ORDER — SODIUM CHLORIDE 0.9 % IV SOLN: INTRAVENOUS | Status: DC

## 2018-11-24 MED ORDER — MIDAZOLAM HCL 2 MG/ML PO SYRP: 8.0000 mg | ORAL_SOLUTION | Freq: Once | ORAL | Status: DC | PRN

## 2018-11-24 MED ORDER — FENTANYL CITRATE (PF) 100 MCG/2ML IJ SOLN
INTRAMUSCULAR | Status: AC
  Filled 2018-11-24: qty 2

## 2018-11-24 MED ORDER — CEFAZOLIN SODIUM-DEXTROSE 1-4 GM/50ML-% IV SOLN
1.0000 g | Freq: Once | INTRAVENOUS | Status: AC
  Administered 2018-11-24: 1 g via INTRAVENOUS

## 2018-11-24 MED ORDER — LIDOCAINE HCL (PF) 1 % IJ SOLN
INTRAMUSCULAR | Status: AC
  Filled 2018-11-24: qty 30

## 2018-11-24 MED ORDER — DIPHENHYDRAMINE HCL 50 MG/ML IJ SOLN: 50.0000 mg | Freq: Once | INTRAMUSCULAR | Status: DC | PRN

## 2018-11-24 MED ORDER — HYDROMORPHONE HCL 1 MG/ML IJ SOLN: 1.0000 mg | Freq: Once | INTRAMUSCULAR | Status: DC | PRN

## 2018-11-24 MED ORDER — FENTANYL CITRATE (PF) 100 MCG/2ML IJ SOLN
INTRAMUSCULAR | Status: DC | PRN
  Administered 2018-11-24 (×2): 50 ug via INTRAVENOUS

## 2018-11-24 MED ORDER — ONDANSETRON HCL 4 MG/2ML IJ SOLN: 4.0000 mg | Freq: Four times a day (QID) | INTRAMUSCULAR | Status: DC | PRN

## 2018-11-24 MED ORDER — MIDAZOLAM HCL 5 MG/5ML IJ SOLN
INTRAMUSCULAR | Status: AC
  Filled 2018-11-24: qty 5

## 2018-11-24 MED ORDER — MIDAZOLAM HCL 2 MG/2ML IJ SOLN
INTRAMUSCULAR | Status: DC | PRN
  Administered 2018-11-24: 1 mg via INTRAVENOUS
  Administered 2018-11-24: 2 mg via INTRAVENOUS

## 2018-11-24 MED ORDER — HEPARIN (PORCINE) IN NACL 1000-0.9 UT/500ML-% IV SOLN
INTRAVENOUS | Status: DC | PRN
  Administered 2018-11-24: 500 mL

## 2018-11-24 MED ORDER — HEPARIN SODIUM (PORCINE) 1000 UNIT/ML IJ SOLN
INTRAMUSCULAR | Status: AC
  Filled 2018-11-24: qty 1

## 2018-11-24 SURGICAL SUPPLY — 7 items
COVER PROBE U/S 5X48 (MISCELLANEOUS) ×3 IMPLANT
DRAPE BRACHIAL (DRAPES) ×3 IMPLANT
NEEDLE ENTRY 21GA 7CM ECHOTIP (NEEDLE) ×3 IMPLANT
PACK ANGIOGRAPHY (CUSTOM PROCEDURE TRAY) ×3 IMPLANT
SET INTRO CAPELLA COAXIAL (SET/KITS/TRAYS/PACK) ×3 IMPLANT
SHEATH BRITE TIP 6FRX5.5 (SHEATH) ×3 IMPLANT
SUT MNCRL AB 4-0 PS2 18 (SUTURE) ×3 IMPLANT

## 2018-11-24 NOTE — Op Note (Signed)
OPERATIVE NOTE   PROCEDURE: 1. Contrast injection right arm AV fistula  PRE-OPERATIVE DIAGNOSIS: Complication of dialysis access                                                       End Stage Renal Disease  POST-OPERATIVE DIAGNOSIS: same as above   SURGEON: Katha Cabal, M.D.  ANESTHESIA: Conscious sedation was administered under my direct supervision by the interventional radiology RN.  IV Versed plus fentanyl were utilized. Continuous ECG, pulse oximetry and blood pressure was monitored throughout the entire procedure.  Conscious sedation was for a total of 25 minutes.  ESTIMATED BLOOD LOSS: minimal  FINDING(S): 1. Widely patent fistula  SPECIMEN(S):  None  CONTRAST: 20 cc  FLUOROSCOPY TIME: 1.1 minutes  INDICATIONS: [name] is a 48 y.o. male who  presents with malfunctioning right arm AV access.  The patient is scheduled for angiography with possible intervention of the AV access to prevent loss of the permanent access.  The patient is aware the risks include but are not limited to: bleeding, infection, thrombosis of the cannulated access, and possible anaphylactic reaction to the contrast.  The patient acknowledges if the access can not be salvaged a tunneled catheter will be needed and will be placed during this procedure.  The patient is aware of the risks of the procedure and elects to proceed with the angiogram and intervention.  DESCRIPTION: After full informed written consent was obtained, the patient was brought back to the Special Procedure suite and placed supine position.  Appropriate cardiopulmonary monitors were placed.  The right arm was prepped and draped in the standard fashion.  Appropriate timeout is called.   The right arm access was cannulated with a micropuncture needle under ultrasound guidence.  Ultrasound was used to evaluate the right arm access.  It was echolucent and compressible indicating it is patent .  An ultrasound image was acquired for  the permanent record.  A micropuncture needle was used to access the right arm AV fistula under direct ultrasound guidance.  The microwire was then advanced under fluoroscopic guidance without difficulty followed by the micro-sheath.  The J-wire was then advanced and a 6 Fr sheath inserted.  Hand injections were completed to image the access from the arterial anastomosis through the entire access.  The central venous structures were also imaged by hand injections.  Based on the images, the fistula is widely patent.  It measures 8 to 10 mm throughout its entire length from the antecubital fossa to the level of the deltoid muscle.  There are no narrowings strictures or other obstructions.  At the antecubital fossa it is just a millimeter to subcutaneous at the level of the deltoid it is approximately 15 mm subcutaneously.  In the midportion is approximately 10 mm deep.    A 4-0 Monocryl purse-string suture was sewn around the sheath.  The sheath was removed and light pressure was applied.  A sterile bandage was applied to the puncture site.    COMPLICATIONS: None  CONDITION: [name], M.D [facility] Office: [phone]  11/24/2018 2:46 PM

## 2018-11-24 NOTE — H&P (Signed)
[facility] Admission History & Physical  MRN : [id number]  [name] is a 48 y.o. (Aug 13, 1971) male who presents with chief complaint of No chief complaint on file. [name]  History of Present Illness: I am asked to evaluate the patient by the dialysis center. The patient was sent here because they were unable to achieve adequate dialysis this morning. Furthermore the Center states there is very poor thrill and bruit. The patient states there there have been increasing problems with the access, such as inability to access the fistula yesterday. The patient estimates these problems have been going on for several days. The patient is unaware of any other change.  Patient denies pain or tenderness overlying the access.  There is no pain with dialysis.  The patient denies hand pain or finger pain consistent with steal syndrome.   There have not been past interventions or declots of this access.  The patient is not chronically hypotensive on dialysis.  Current Facility-Administered Medications  Medication Dose Route Frequency Provider Last Rate Last Dose  . 0.9 %  sodium chloride infusion   Intravenous Continuous Eulogio Ditch E, NP      . ceFAZolin (ANCEF) IVPB 1 g/50 mL premix  1 g Intravenous Once Eulogio Ditch E, NP      . diphenhydrAMINE (BENADRYL) injection 50 mg  50 mg Intravenous Once PRN Kris Hartmann, NP      . famotidine (PEPCID) tablet 40 mg  40 mg Oral Once PRN Kris Hartmann, NP      . fentaNYL (SUBLIMAZE) 100 MCG/2ML injection           . heparin 1000 UNIT/ML injection           . HYDROmorphone (DILAUDID) injection 1 mg  1 mg Intravenous Once PRN Eulogio Ditch E, NP      . methylPREDNISolone sodium succinate (SOLU-MEDROL) 125 mg/2 mL injection 125 mg  125 mg Intravenous Once PRN Eulogio Ditch E, NP      . midazolam (VERSED) 2 MG/ML syrup 8 mg  8 mg Oral Once PRN Kris Hartmann, NP      . midazolam (VERSED) 5 MG/5ML injection           . ondansetron  (ZOFRAN) injection 4 mg  4 mg Intravenous Q6H PRN Kris Hartmann, NP        Past Medical History:  Diagnosis Date  . Anxiety   . Chronic kidney disease    14% FUNCTION  . Depression   . Diabetes mellitus without complication ([place])   . Dyspnea    DOE  . Dysrhythmia   . History of orthopnea   . Hypertension   . Lymphedema of leg   . Microalbuminuria   . Sleep apnea    no CPAP  . Vitamin D deficiency     Past Surgical History:  Procedure Laterality Date  . A/V FISTULAGRAM Right 10/24/2018   Procedure: A/V FISTULAGRAM;  Surgeon: Katha Cabal, MD;  Location: [facility];  Service: Cardiovascular;  Laterality: Right;  . APPLICATION OF WOUND VAC Left 10/03/2017   Procedure: APPLICATION OF WOUND VAC;  Surgeon: Algernon Huxley, MD;  Location: [facility];  Service: General;  Laterality: Left;  . APPLICATION OF WOUND VAC Left 10/11/2017   Procedure: APPLICATION OF WOUND VAC;  Surgeon: Katha Cabal, MD;  Location: [facility];  Service: Vascular;  Laterality: Left;  . APPLICATION OF WOUND VAC Left 10/14/2017   Procedure: WOUND VAC  CHANGE;  Surgeon: Katha Cabal, MD;  Location: [facility];  Service: Vascular;  Laterality: Left;  left lower leg  . APPLICATION OF WOUND VAC Left 10/18/2017   Procedure: WOUND VAC CHANGE;  Surgeon: Katha Cabal, MD;  Location: [facility];  Service: Vascular;  Laterality: Left;  . APPLICATION OF WOUND VAC Left 10/07/2017   Procedure: APPLICATION OF WOUND VAC;  Surgeon: Katha Cabal, MD;  Location: [facility];  Service: Vascular;  Laterality: Left;  . AV FISTULA INSERTION W/ RF MAGNETIC GUIDANCE Right 07/19/2018   Procedure: AV FISTULA INSERTION W/RF MAGNETIC GUIDANCE;  Surgeon: Katha Cabal, MD;  Location: [facility];  Service: Cardiovascular;  Laterality: Right;  . CATARACT EXTRACTION W/PHACO Left 11/01/2018   Procedure: CATARACT EXTRACTION PHACO AND INTRAOCULAR LENS PLACEMENT (IOC)-LEFT, DIABETIC-INSULIN DEPENDENT;  Surgeon:  Eulogio Bear, MD;  Location: [facility];  Service: Ophthalmology;  Laterality: Left;  [place] 00:41.8 CDE 4.61 Fluid Pack Lot # [id number]  . I&D EXTREMITY Left 10/11/2017   Procedure: IRRIGATION AND DEBRIDEMENT EXTREMITY;  Surgeon: Katha Cabal, MD;  Location: [facility];  Service: Vascular;  Laterality: Left;  . INCISION AND DRAINAGE ABSCESS Right 10/07/2017   Procedure: INCISION AND DRAINAGE ABSCESS;  Surgeon: Katha Cabal, MD;  Location: [facility];  Service: Vascular;  Laterality: Right;  . IRRIGATION AND DEBRIDEMENT ABSCESS Left 10/03/2017   Procedure: IRRIGATION AND DEBRIDEMENT ABSCESS with debridement of skin, soft tissue, muscle 50sq cm;  Surgeon: Algernon Huxley, MD;  Location: [facility];  Service: General;  Laterality: Left;    Social History Social History   Tobacco Use  . Smoking status: Former Smoker    Types: Cigars  . Smokeless tobacco: Former Systems developer    Quit date: 09/30/2017  . Tobacco comment: occasional  Substance Use Topics  . Alcohol use: Yes    Alcohol/week: 1.0 standard drinks    Types: 1 Cans of beer per week    Comment: beer  . Drug use: Not Currently    Family History Family History  Problem Relation Age of Onset  . Diabetes Father   . Kidney disease Father   . Diabetes Daughter     No family history of bleeding or clotting disorders, autoimmune disease or porphyria  Allergies  Allergen Reactions  . Lisinopril Swelling  . Adhesive [Tape] Itching  . Furosemide Other (See Comments)    Light headed, cough, blurry vision, weakness.     REVIEW OF SYSTEMS (Negative unless checked)  Constitutional: [] Weight loss  [] Fever  [] Chills Cardiac: [] Chest pain   [] Chest pressure   [] Palpitations   [] Shortness of breath when laying flat   [] Shortness of breath at rest   [x] Shortness of breath with exertion. Vascular:  [] Pain in legs with walking   [] Pain in legs at rest   [] Pain in legs when laying flat   [] Claudication   [] Pain in feet when walking  [] Pain in feet  at rest  [] Pain in feet when laying flat   [] History of DVT   [] Phlebitis   [x] Swelling in legs   [] Varicose veins   [] Non-healing ulcers Pulmonary:   [] Uses home oxygen   [] Productive cough   [] Hemoptysis   [] Wheeze  [] COPD   [] Asthma Neurologic:  [] Dizziness  [] Blackouts   [] Seizures   [] History of stroke   [] History of TIA  [] Aphasia   [] Temporary blindness   [] Dysphagia   [] Weakness or numbness in arms   [] Weakness or numbness in legs Musculoskeletal:  [] Arthritis   []   Joint swelling   [] Joint pain   [] Low back pain Hematologic:  [] Easy bruising  [] Easy bleeding   [] Hypercoagulable state   [] Anemic  [] Hepatitis Gastrointestinal:  [] Blood in stool   [] Vomiting blood  [] Gastroesophageal reflux/heartburn   [] Difficulty swallowing. Genitourinary:  [x] Chronic kidney disease   [] Difficult urination  [] Frequent urination  [] Burning with urination   [] Blood in urine Skin:  [] Rashes   [] Ulcers   [] Wounds Psychological:  [] History of anxiety   []  History of major depression.  Physical Examination  Vitals:   11/24/18 1129  BP: (!) 175/79  Pulse: (!) 58  Resp: 15  Temp: (!) 97.5 F (36.4 C)  TempSrc: Oral  SpO2: 94%  Weight: (!) 173.3 kg  Height: 6\' 4"  (1.93 m)   Body mass index is 46.5 kg/m. Gen: WD/WN, NAD Head: [place]/AT, No temporalis wasting. Prominent temp pulse not noted. Ear/Nose/Throat: Hearing grossly intact, nares w/o erythema or drainage, oropharynx w/o Erythema/Exudate,  Eyes: Conjunctiva clear, sclera non-icteric Neck: Trachea midline.  No JVD.  Pulmonary:  Good air movement, respirations not labored, no use of accessory muscles.  Cardiac: RRR, normal S1, S2. Vascular: right arm av fistula with good thrill good bruit Vessel Right Left  Radial Palpable Palpable  Ulnar Not Palpable Not Palpable  Brachial Palpable Palpable  Carotid Palpable, without bruit Palpable, without bruit  Gastrointestinal: soft, non-tender/non-distended. No guarding/reflex.  Musculoskeletal: M/S 5/5  throughout.  Extremities without ischemic changes.  No deformity or atrophy.  Neurologic: Sensation grossly intact in extremities.  Symmetrical.  Speech is fluent. Motor exam as listed above. Psychiatric: Judgment intact, Mood & affect appropriate for pt's clinical situation. Dermatologic: No rashes or ulcers noted.  No cellulitis or open wounds. Lymph : No Cervical, Axillary, or Inguinal lymphadenopathy.   CBC Lab Results  Component Value Date   WBC 5.9 07/14/2018   HGB 9.9 (L) 11/01/2018   HCT 29.0 (L) 11/01/2018   MCV 89.3 07/14/2018   PLT 193 07/14/2018    BMET    Component Value Date/Time   NA 141 11/01/2018 0959   K 4.0 11/01/2018 0959   CL 111 07/14/2018 1352   CO2 26 07/14/2018 1352   GLUCOSE 122 (H) 11/01/2018 0959   BUN 73 (H) 07/14/2018 1352   CREATININE 4.54 (H) 07/14/2018 1352   CALCIUM 8.5 (L) 07/14/2018 1352   GFRNONAA 14 (L) 07/14/2018 1352   GFRAA 16 (L) 07/14/2018 1352   CrCl cannot be calculated (Patient's most recent lab result is older than the maximum 21 days allowed.).  COAG Lab Results  Component Value Date   INR 1.09 07/14/2018   INR 1.05 10/14/2017   INR 1.16 10/06/2017    Radiology No results found.  Assessment/Plan 1.  Complication dialysis device with thrombosis AV access:  Patient's right arm fistula is malfunctioning. The patient will undergo angiography and correction of any problems using interventional techniques with the hope of restoring function to the access.  The risks and benefits were described to the patient.  All questions were answered.  The patient agrees to proceed with angiography and intervention. Potassium will be drawn to ensure that it is an appropriate level prior to performing intervention. 2.  End-stage renal disease requiring hemodialysis:  Patient will continue dialysis therapy without further interruption if a successful intervention is not achieved then a tunneled catheter will be placed. Dialysis has already been  arranged. 3.  Hypertension:  Patient will continue medical management; nephrology is following no changes in oral medications. 4. Diabetes mellitus:  Glucose  will be monitored and oral medications been held this morning once the patient has undergone the patient's procedure po intake will be reinitiated and again Accu-Cheks will be used to assess the blood glucose level and treat as needed. The patient will be restarted on the patient's usual hypoglycemic regime    [name], MD  11/24/2018 12:14 PM

## 2018-11-27 ENCOUNTER — Encounter: Payer: Self-pay | Admitting: Vascular Surgery

## 2018-12-07 ENCOUNTER — Ambulatory Visit (INDEPENDENT_AMBULATORY_CARE_PROVIDER_SITE_OTHER): Payer: BLUE CROSS/BLUE SHIELD | Admitting: Vascular Surgery

## 2018-12-07 ENCOUNTER — Encounter (INDEPENDENT_AMBULATORY_CARE_PROVIDER_SITE_OTHER): Payer: Self-pay | Admitting: Vascular Surgery

## 2018-12-07 ENCOUNTER — Other Ambulatory Visit: Payer: Self-pay

## 2018-12-07 VITALS — BP 177/84 | HR 60 | Resp 16 | Ht 76.0 in | Wt 361.4 lb

## 2018-12-07 DIAGNOSIS — I872 Venous insufficiency (chronic) (peripheral): Secondary | ICD-10-CM

## 2018-12-07 DIAGNOSIS — Z79899 Other long term (current) drug therapy: Secondary | ICD-10-CM

## 2018-12-07 DIAGNOSIS — I890 Lymphedema, not elsewhere classified: Secondary | ICD-10-CM

## 2018-12-07 DIAGNOSIS — I120 Hypertensive chronic kidney disease with stage 5 chronic kidney disease or end stage renal disease: Secondary | ICD-10-CM | POA: Diagnosis not present

## 2018-12-07 DIAGNOSIS — N186 End stage renal disease: Secondary | ICD-10-CM | POA: Diagnosis not present

## 2018-12-07 DIAGNOSIS — I10 Essential (primary) hypertension: Secondary | ICD-10-CM

## 2018-12-18 ENCOUNTER — Encounter (INDEPENDENT_AMBULATORY_CARE_PROVIDER_SITE_OTHER): Payer: Self-pay | Admitting: Vascular Surgery

## 2018-12-18 NOTE — Progress Notes (Signed)
MRN : [id number]  [name] is a 48 y.o. (08-28-1971) male who presents with chief complaint of  Chief Complaint  Patient presents with  . Follow-up    [facility] 2week follow up  .  History of Present Illness:   The patient returns to the office for followup of their dialysis access. The function of the access has been stable and he states that they have been able to cannulate him. The patient denies increased bleeding time or increased recirculation. Patient denies difficulty with cannulation. The patient denies hand pain or other symptoms consistent with steal phenomena.  No significant arm swelling.  The patient denies redness or swelling at the access site. The patient denies fever or chills at home or while on dialysis.  The patient denies amaurosis fugax or recent TIA symptoms. There are no recent neurological changes noted. The patient denies claudication symptoms or rest pain symptoms. The patient denies history of DVT, PE or superficial thrombophlebitis. The patient denies recent episodes of angina or shortness of breath.        Current Meds  Medication Sig  . allopurinol (ZYLOPRIM) 100 MG tablet Take 100 mg by mouth daily.  [name] amLODipine (NORVASC) 10 MG tablet Take 10 mg by mouth daily.   . Ascorbic Acid (VITAMIN C WITH ROSE HIPS) 1000 MG tablet Take 1,000 mg by mouth daily.  [name] atenolol (TENORMIN) 100 MG tablet Take 1 tablet (100 mg total) by mouth daily.  . cloNIDine (CATAPRES) 0.1 MG tablet Take 0.1 mg by mouth 2 (two) times daily.   . clopidogrel (PLAVIX) 75 MG tablet Take 75 mg by mouth daily.  . ferrous gluconate (FERGON) 240 (27 FE) MG tablet Take 240 mg by mouth 2 (two) times a week.   . Insulin Glargine (LANTUS SOLOSTAR) 100 UNIT/ML Solostar Pen Inject 12 Units into the skin every evening.   . lidocaine-prilocaine (EMLA) cream Apply 1 application topically as needed (for fistula).  . Melatonin 5 MG CAPS Take 5 mg by mouth at bedtime as needed (sleep).  .  metolazone (ZAROXOLYN) 5 MG tablet Take 5 mg by mouth every Monday, Wednesday, and Friday.   . Multiple Vitamin (MULTIVITAMIN WITH MINERALS) TABS tablet Take 1 tablet by mouth daily. Libido max otc supplement  . Multiple Vitamins-Minerals (EYE HEALTH PO) Take 1 capsule by mouth daily.   . Multiple Vitamins-Minerals (MULTIVITAMIN PO) Take 1 tablet by mouth daily. Vein otc supplement  . Omega-3 1000 MG CAPS Take 1,000 mg by mouth daily.  [name] OVER THE COUNTER MEDICATION Take 1 tablet by mouth daily. alpha king supreme testosterone booster otc supplement  . torsemide (DEMADEX) 20 MG tablet Take 40 mg by mouth 2 (two) times daily.     Past Medical History:  Diagnosis Date  . Anxiety   . Chronic kidney disease    14% FUNCTION  . Depression   . Diabetes mellitus without complication ([place])   . Dyspnea    DOE  . Dysrhythmia   . History of orthopnea   . Hypertension   . Lymphedema of leg   . Microalbuminuria   . Sleep apnea    no CPAP  . Vitamin D deficiency     Past Surgical History:  Procedure Laterality Date  . A/V FISTULAGRAM Right 10/24/2018   Procedure: A/V FISTULAGRAM;  Surgeon: Katha Cabal, MD;  Location: [facility];  Service: Cardiovascular;  Laterality: Right;  . A/V FISTULAGRAM Right 11/24/2018   Procedure: A/V FISTULAGRAM;  Surgeon: Delana Meyer,  Dolores Lory, MD;  Location: [facility];  Service: Cardiovascular;  Laterality: Right;  . APPLICATION OF WOUND VAC Left 10/03/2017   Procedure: APPLICATION OF WOUND VAC;  Surgeon: Algernon Huxley, MD;  Location: [facility];  Service: General;  Laterality: Left;  . APPLICATION OF WOUND VAC Left 10/11/2017   Procedure: APPLICATION OF WOUND VAC;  Surgeon: Katha Cabal, MD;  Location: [facility];  Service: Vascular;  Laterality: Left;  . APPLICATION OF WOUND VAC Left 10/14/2017   Procedure: WOUND VAC CHANGE;  Surgeon: Katha Cabal, MD;  Location: [facility];  Service: Vascular;  Laterality: Left;  left lower leg  .  APPLICATION OF WOUND VAC Left 10/18/2017   Procedure: WOUND VAC CHANGE;  Surgeon: Katha Cabal, MD;  Location: [facility];  Service: Vascular;  Laterality: Left;  . APPLICATION OF WOUND VAC Left 10/07/2017   Procedure: APPLICATION OF WOUND VAC;  Surgeon: Katha Cabal, MD;  Location: [facility];  Service: Vascular;  Laterality: Left;  . AV FISTULA INSERTION W/ RF MAGNETIC GUIDANCE Right 07/19/2018   Procedure: AV FISTULA INSERTION W/RF MAGNETIC GUIDANCE;  Surgeon: Katha Cabal, MD;  Location: [facility];  Service: Cardiovascular;  Laterality: Right;  . CATARACT EXTRACTION W/PHACO Left 11/01/2018   Procedure: CATARACT EXTRACTION PHACO AND INTRAOCULAR LENS PLACEMENT (IOC)-LEFT, DIABETIC-INSULIN DEPENDENT;  Surgeon: Eulogio Bear, MD;  Location: [facility];  Service: Ophthalmology;  Laterality: Left;  [place] 00:41.8 CDE 4.61 Fluid Pack Lot # [id number]  . I&D EXTREMITY Left 10/11/2017   Procedure: IRRIGATION AND DEBRIDEMENT EXTREMITY;  Surgeon: Katha Cabal, MD;  Location: [facility];  Service: Vascular;  Laterality: Left;  . INCISION AND DRAINAGE ABSCESS Right 10/07/2017   Procedure: INCISION AND DRAINAGE ABSCESS;  Surgeon: Katha Cabal, MD;  Location: [facility];  Service: Vascular;  Laterality: Right;  . IRRIGATION AND DEBRIDEMENT ABSCESS Left 10/03/2017   Procedure: IRRIGATION AND DEBRIDEMENT ABSCESS with debridement of skin, soft tissue, muscle 50sq cm;  Surgeon: Algernon Huxley, MD;  Location: [facility];  Service: General;  Laterality: Left;    Social History Social History   Tobacco Use  . Smoking status: Former Smoker    Types: Cigars  . Smokeless tobacco: Former Systems developer    Quit date: 09/30/2017  . Tobacco comment: occasional  Substance Use Topics  . Alcohol use: Yes    Alcohol/week: 1.0 standard drinks    Types: 1 Cans of beer per week    Comment: beer  . Drug use: Not Currently    Family History Family History  Problem Relation Age of Onset  . Diabetes Father    . Kidney disease Father   . Diabetes Daughter     Allergies  Allergen Reactions  . Lisinopril Swelling  . Adhesive [Tape] Itching  . Furosemide Other (See Comments)    Light headed, cough, blurry vision, weakness.     REVIEW OF SYSTEMS (Negative unless checked)  Constitutional: [] Weight loss  [] Fever  [] Chills Cardiac: [] Chest pain   [] Chest pressure   [] Palpitations   [] Shortness of breath when laying flat   [] Shortness of breath with exertion. Vascular:  [] Pain in legs with walking   [] Pain in legs at rest  [] History of DVT   [] Phlebitis   [] Swelling in legs   [] Varicose veins   [] Non-healing ulcers Pulmonary:   [] Uses home oxygen   [] Productive cough   [] Hemoptysis   [] Wheeze  [] COPD   [] Asthma Neurologic:  [] Dizziness   [] Seizures   []   History of stroke   [] History of TIA  [] Aphasia   [] Vissual changes   [] Weakness or numbness in arm   [] Weakness or numbness in leg Musculoskeletal:   [] Joint swelling   [] Joint pain   [] Low back pain Hematologic:  [] Easy bruising  [] Easy bleeding   [] Hypercoagulable state   [] Anemic Gastrointestinal:  [] Diarrhea   [] Vomiting  [] Gastroesophageal reflux/heartburn   [] Difficulty swallowing. Genitourinary:  [x] Chronic kidney disease   [] Difficult urination  [] Frequent urination   [] Blood in urine Skin:  [] Rashes   [] Ulcers  Psychological:  [] History of anxiety   []  History of major depression.  Physical Examination  Vitals:   12/07/18 1349  BP: (!) 177/84  Pulse: 60  Resp: 16  Weight: (!) 361 lb 6.4 oz (163.9 kg)  Height: 6\' 4"  (1.93 m)   Body mass index is 43.99 kg/m. Gen: WD/WN, NAD Head: [place]/AT, No temporalis wasting.  Ear/Nose/Throat: Hearing grossly intact, nares w/o erythema or drainage Eyes: PER, EOMI, sclera nonicteric.  Neck: Supple, no large masses.   Pulmonary:  Good air movement, no audible wheezing bilaterally, no use of accessory muscles.  Cardiac: RRR, no JVD Vascular:  Right arm AV fistula good thrill good bruit, 3-4+ edema  of the legs  Vessel Right Left  Radial Palpable Palpable  Brachial Palpable Palpable  PT Palpable Palpable  DP Palpable Palpable  Gastrointestinal: Non-distended. No guarding/no peritoneal signs.  Musculoskeletal: M/S 5/5 throughout.  No deformity or atrophy.  Neurologic: CN 2-12 intact. Symmetrical.  Speech is fluent. Motor exam as listed above. Psychiatric: Judgment intact, Mood & affect appropriate for pt's clinical situation. Dermatologic: severe venous rashes healing small  ulcers noted.  No changes consistent with cellulitis. Lymph : No lichenification or skin changes of chronic lymphedema.  CBC Lab Results  Component Value Date   WBC 5.9 07/14/2018   HGB 9.9 (L) 11/01/2018   HCT 29.0 (L) 11/01/2018   MCV 89.3 07/14/2018   PLT 193 07/14/2018    BMET    Component Value Date/Time   NA 141 11/01/2018 0959   K 4.0 11/01/2018 0959   CL 111 07/14/2018 1352   CO2 26 07/14/2018 1352   GLUCOSE 122 (H) 11/01/2018 0959   BUN 73 (H) 07/14/2018 1352   CREATININE 4.54 (H) 07/14/2018 1352   CALCIUM 8.5 (L) 07/14/2018 1352   GFRNONAA 14 (L) 07/14/2018 1352   GFRAA 16 (L) 07/14/2018 1352   CrCl cannot be calculated (Patient's most recent lab result is older than the maximum 21 days allowed.).  COAG Lab Results  Component Value Date   INR 1.09 07/14/2018   INR 1.05 10/14/2017   INR 1.16 10/06/2017    Radiology No results found.    Assessment/Plan 1. End stage renal disease ([place]) Recommend:  The patient is doing well and currently has adequate dialysis access. The patient's dialysis center is not reporting any access issues. Flow pattern is stable when compared to the prior ultrasound.  The patient should have a duplex ultrasound of the dialysis access in 6 months. The patient will follow-up with me in the office after each ultrasound    - VAS [place] (AVF, AVG); Future  2. Chronic venous insufficiency  The patient returns to the office for  followup evaluation regarding leg swelling.  The swelling has persisted but with the lymph pump the patient states the swelling is much better controlled. The pain associated with swelling is essentially eliminated. There have not been any interval development of a ulcerations or  wounds.  No episodes of cellulitis or infection over the past 12 months  The patient denies problems with the pump, noting it is working well and the leggings are in good condition.  Since the previous visit the patient has been wearing graduated compression stockings and using the lymph pump on a routine basis and  has noted significant improvement in the lymphedema.   Patient stated the lymph pump has been a very positive factor in her care.   3. Lymphedema  The patient returns to the office for followup evaluation regarding leg swelling.  The swelling has persisted but with the lymph pump the patient states the swelling is much better controlled. The pain associated with swelling is essentially eliminated. There have not been any interval development of a ulcerations or wounds.  No episodes of cellulitis or infection over the past 12 months  The patient denies problems with the pump, noting it is working well and the leggings are in good condition.  Since the previous visit the patient has been wearing graduated compression stockings and using the lymph pump on a routine basis and  has noted significant improvement in the lymphedema.   Patient stated the lymph pump has been a very positive factor in her care.   4. Essential hypertension Continue antihypertensive medications as already ordered, these medications have been reviewed and there are no changes at this time.    [name], MD  12/18/2018 11:19 AM

## 2018-12-20 ENCOUNTER — Ambulatory Visit: Admission: RE | Admit: 2018-12-20 | Payer: BLUE CROSS/BLUE SHIELD | Source: Home / Self Care | Admitting: Ophthalmology

## 2018-12-20 SURGERY — PHACOEMULSIFICATION, CATARACT, WITH IOL INSERTION
Anesthesia: Topical | Laterality: Left

## 2019-02-19 ENCOUNTER — Encounter (INDEPENDENT_AMBULATORY_CARE_PROVIDER_SITE_OTHER): Payer: Self-pay

## 2019-02-19 ENCOUNTER — Other Ambulatory Visit (INDEPENDENT_AMBULATORY_CARE_PROVIDER_SITE_OTHER): Payer: Self-pay | Admitting: Nurse Practitioner

## 2019-02-19 ENCOUNTER — Telehealth (INDEPENDENT_AMBULATORY_CARE_PROVIDER_SITE_OTHER): Payer: Self-pay

## 2019-02-19 NOTE — Telephone Encounter (Signed)
I spoke with the patient regarding his angio procedure with Dr. Delana Meyer on 02/20/2019. Patient will arrive at the [facility] to do Covid testing then go to the [facility] for his procedure. All pre-procedure instructions were discussed as well as zero visitor policy. This information will be faxed to the patient's dialysis center at [facility].

## 2019-02-20 ENCOUNTER — Encounter
Admission: RE | Disposition: A | Discharge status: Home / Self Care | Payer: Self-pay | Source: Home / Self Care | Attending: Internal Medicine

## 2019-02-20 ENCOUNTER — Observation Stay
Admission: RE | Admit: 2019-02-20 | Discharge: 2019-02-21 | Disposition: A | Discharge status: Home / Self Care | Payer: Medicare Other | Source: Home / Self Care | Attending: Internal Medicine | Admitting: Internal Medicine

## 2019-02-20 ENCOUNTER — Other Ambulatory Visit: Payer: Self-pay

## 2019-02-20 ENCOUNTER — Other Ambulatory Visit
Admission: RE | Admit: 2019-02-20 | Discharge: 2019-02-20 | Disposition: A | Discharge status: Home / Self Care | Payer: Medicare Other | Source: Ambulatory Visit | Attending: Vascular Surgery | Admitting: Vascular Surgery

## 2019-02-20 ENCOUNTER — Ambulatory Visit: Payer: Medicare Other

## 2019-02-20 DIAGNOSIS — N186 End stage renal disease: Secondary | ICD-10-CM

## 2019-02-20 DIAGNOSIS — Z87891 Personal history of nicotine dependence: Secondary | ICD-10-CM | POA: Insufficient documentation

## 2019-02-20 DIAGNOSIS — Z1159 Encounter for screening for other viral diseases: Secondary | ICD-10-CM | POA: Insufficient documentation

## 2019-02-20 DIAGNOSIS — Z79899 Other long term (current) drug therapy: Secondary | ICD-10-CM | POA: Insufficient documentation

## 2019-02-20 DIAGNOSIS — Z7982 Long term (current) use of aspirin: Secondary | ICD-10-CM | POA: Insufficient documentation

## 2019-02-20 DIAGNOSIS — M109 Gout, unspecified: Secondary | ICD-10-CM | POA: Insufficient documentation

## 2019-02-20 DIAGNOSIS — T8241XA Breakdown (mechanical) of vascular dialysis catheter, initial encounter: Secondary | ICD-10-CM | POA: Insufficient documentation

## 2019-02-20 DIAGNOSIS — I120 Hypertensive chronic kidney disease with stage 5 chronic kidney disease or end stage renal disease: Secondary | ICD-10-CM | POA: Insufficient documentation

## 2019-02-20 DIAGNOSIS — G4730 Sleep apnea, unspecified: Secondary | ICD-10-CM | POA: Insufficient documentation

## 2019-02-20 DIAGNOSIS — T82868A Thrombosis of vascular prosthetic devices, implants and grafts, initial encounter: Secondary | ICD-10-CM | POA: Insufficient documentation

## 2019-02-20 DIAGNOSIS — E1122 Type 2 diabetes mellitus with diabetic chronic kidney disease: Secondary | ICD-10-CM | POA: Insufficient documentation

## 2019-02-20 DIAGNOSIS — Y832 Surgical operation with anastomosis, bypass or graft as the cause of abnormal reaction of the patient, or of later complication, without mention of misadventure at the time of the procedure: Secondary | ICD-10-CM | POA: Insufficient documentation

## 2019-02-20 DIAGNOSIS — Z7901 Long term (current) use of anticoagulants: Secondary | ICD-10-CM | POA: Insufficient documentation

## 2019-02-20 DIAGNOSIS — Z794 Long term (current) use of insulin: Secondary | ICD-10-CM | POA: Insufficient documentation

## 2019-02-20 DIAGNOSIS — E875 Hyperkalemia: Secondary | ICD-10-CM

## 2019-02-20 DIAGNOSIS — Z992 Dependence on renal dialysis: Secondary | ICD-10-CM | POA: Insufficient documentation

## 2019-02-20 DIAGNOSIS — I871 Compression of vein: Secondary | ICD-10-CM | POA: Insufficient documentation

## 2019-02-20 DIAGNOSIS — Z452 Encounter for adjustment and management of vascular access device: Secondary | ICD-10-CM

## 2019-02-20 DIAGNOSIS — Z888 Allergy status to other drugs, medicaments and biological substances status: Secondary | ICD-10-CM | POA: Insufficient documentation

## 2019-02-20 DIAGNOSIS — D631 Anemia in chronic kidney disease: Secondary | ICD-10-CM | POA: Insufficient documentation

## 2019-02-20 DIAGNOSIS — Z7902 Long term (current) use of antithrombotics/antiplatelets: Secondary | ICD-10-CM | POA: Insufficient documentation

## 2019-02-20 DIAGNOSIS — T829XXA Unspecified complication of cardiac and vascular prosthetic device, implant and graft, initial encounter: Secondary | ICD-10-CM

## 2019-02-20 HISTORY — PX: TEMPORARY DIALYSIS CATHETER: CATH118312

## 2019-02-20 LAB — GLUCOSE, CAPILLARY
Glucose-Capillary: 81 mg/dL (ref 70–99)
Glucose-Capillary: 113 mg/dL — ABNORMAL HIGH (ref 70–99)

## 2019-02-20 LAB — POTASSIUM: Potassium: 4.3 mmol/L (ref 3.5–5.1)

## 2019-02-20 LAB — POTASSIUM (ARMC VASCULAR LAB ONLY): Potassium (ARMC vascular lab): 6 — ABNORMAL HIGH (ref 3.5–5.1)

## 2019-02-20 LAB — SARS CORONAVIRUS 2 BY RT PCR (HOSPITAL ORDER, PERFORMED IN ~~LOC~~ HOSPITAL LAB): SARS Coronavirus 2: NEGATIVE

## 2019-02-20 SURGERY — TEMPORARY DIALYSIS CATHETER
Anesthesia: Moderate Sedation | Laterality: Right

## 2019-02-20 MED ORDER — CHLORHEXIDINE GLUCONATE CLOTH 2 % EX PADS: 6.0000 | MEDICATED_PAD | Freq: Every day | CUTANEOUS | Status: DC

## 2019-02-20 MED ORDER — INSULIN GLARGINE 100 UNIT/ML ~~LOC~~ SOLN
10.0000 [IU] | Freq: Every day | SUBCUTANEOUS | Status: DC
  Administered 2019-02-20: 22:00:00 10 [IU] via SUBCUTANEOUS
  Filled 2019-02-20 (×2): qty 0.1

## 2019-02-20 MED ORDER — ACETAMINOPHEN 325 MG PO TABS: 650.0000 mg | ORAL_TABLET | Freq: Four times a day (QID) | ORAL | Status: DC | PRN

## 2019-02-20 MED ORDER — CEFAZOLIN SODIUM-DEXTROSE 1-4 GM/50ML-% IV SOLN: 1.0000 g | Freq: Once | INTRAVENOUS | Status: DC

## 2019-02-20 MED ORDER — AMLODIPINE BESYLATE 10 MG PO TABS
10.0000 mg | ORAL_TABLET | Freq: Every day | ORAL | Status: DC
  Administered 2019-02-21: 10 mg via ORAL
  Filled 2019-02-20: qty 2

## 2019-02-20 MED ORDER — CLONIDINE HCL 0.1 MG PO TABS: 0.1000 mg | ORAL_TABLET | Freq: Two times a day (BID) | ORAL | Status: DC

## 2019-02-20 MED ORDER — HEPARIN SODIUM (PORCINE) 5000 UNIT/ML IJ SOLN
5000.0000 [IU] | Freq: Three times a day (TID) | INTRAMUSCULAR | Status: DC
  Administered 2019-02-20: 5000 [IU] via SUBCUTANEOUS
  Filled 2019-02-20: qty 1

## 2019-02-20 MED ORDER — ONDANSETRON HCL 4 MG/2ML IJ SOLN: 4.0000 mg | Freq: Four times a day (QID) | INTRAMUSCULAR | Status: DC | PRN

## 2019-02-20 MED ORDER — METOLAZONE 5 MG PO TABS
5.0000 mg | ORAL_TABLET | ORAL | Status: DC
  Administered 2019-02-21: 5 mg via ORAL
  Filled 2019-02-20: qty 1

## 2019-02-20 MED ORDER — INSULIN ASPART 100 UNIT/ML ~~LOC~~ SOLN: 0.0000 [IU] | Freq: Every day | SUBCUTANEOUS | Status: DC

## 2019-02-20 MED ORDER — HYDROMORPHONE HCL 1 MG/ML IJ SOLN: 1.0000 mg | Freq: Once | INTRAMUSCULAR | Status: DC | PRN

## 2019-02-20 MED ORDER — METHYLPREDNISOLONE SODIUM SUCC 125 MG IJ SOLR: 125.0000 mg | Freq: Once | INTRAMUSCULAR | Status: DC | PRN

## 2019-02-20 MED ORDER — MIDAZOLAM HCL 2 MG/ML PO SYRP: 8.0000 mg | ORAL_SOLUTION | Freq: Once | ORAL | Status: DC | PRN

## 2019-02-20 MED ORDER — POLYETHYLENE GLYCOL 3350 17 G PO PACK: 17.0000 g | PACK | Freq: Every day | ORAL | Status: DC | PRN

## 2019-02-20 MED ORDER — TORSEMIDE 100 MG PO TABS: 100.0000 mg | ORAL_TABLET | Freq: Once | ORAL | Status: DC

## 2019-02-20 MED ORDER — CEFAZOLIN SODIUM-DEXTROSE 1-4 GM/50ML-% IV SOLN
INTRAVENOUS | Status: AC
  Filled 2019-02-20: qty 50

## 2019-02-20 MED ORDER — INSULIN ASPART 100 UNIT/ML ~~LOC~~ SOLN: 0.0000 [IU] | Freq: Three times a day (TID) | SUBCUTANEOUS | Status: DC

## 2019-02-20 MED ORDER — ONDANSETRON HCL 4 MG PO TABS: 4.0000 mg | ORAL_TABLET | Freq: Four times a day (QID) | ORAL | Status: DC | PRN

## 2019-02-20 MED ORDER — SODIUM CHLORIDE 0.9 % IV SOLN: INTRAVENOUS | Status: DC

## 2019-02-20 MED ORDER — DIPHENHYDRAMINE HCL 50 MG/ML IJ SOLN: 50.0000 mg | Freq: Once | INTRAMUSCULAR | Status: DC | PRN

## 2019-02-20 MED ORDER — HEPARIN SODIUM (PORCINE) 1000 UNIT/ML DIALYSIS
20.0000 [IU]/kg | INTRAMUSCULAR | Status: DC | PRN
  Filled 2019-02-20: qty 4

## 2019-02-20 MED ORDER — FERROUS GLUCONATE 324 (38 FE) MG PO TABS
324.0000 mg | ORAL_TABLET | ORAL | Status: DC
  Filled 2019-02-20: qty 1

## 2019-02-20 MED ORDER — LIDOCAINE HCL (PF) 1 % IJ SOLN
INTRAMUSCULAR | Status: DC | PRN
  Administered 2019-02-20: 5 mL via INTRADERMAL

## 2019-02-20 MED ORDER — ALLOPURINOL 100 MG PO TABS
100.0000 mg | ORAL_TABLET | Freq: Every day | ORAL | Status: DC
  Administered 2019-02-21: 11:00:00 100 mg via ORAL
  Filled 2019-02-20: qty 1

## 2019-02-20 MED ORDER — ASPIRIN EC 325 MG PO TBEC: 325.0000 mg | DELAYED_RELEASE_TABLET | Freq: Every day | ORAL | Status: DC

## 2019-02-20 MED ORDER — FAMOTIDINE 20 MG PO TABS: 40.0000 mg | ORAL_TABLET | Freq: Once | ORAL | Status: DC | PRN

## 2019-02-20 MED ORDER — ACETAMINOPHEN 650 MG RE SUPP: 650.0000 mg | Freq: Four times a day (QID) | RECTAL | Status: DC | PRN

## 2019-02-20 MED ORDER — MELATONIN 5 MG PO TABS
5.0000 mg | ORAL_TABLET | Freq: Every evening | ORAL | Status: DC | PRN
  Administered 2019-02-20: 5 mg via ORAL
  Filled 2019-02-20 (×2): qty 1

## 2019-02-20 MED ORDER — ATENOLOL 100 MG PO TABS
100.0000 mg | ORAL_TABLET | Freq: Every day | ORAL | Status: DC
  Administered 2019-02-21: 11:00:00 50 mg via ORAL
  Filled 2019-02-20: qty 1

## 2019-02-20 SURGICAL SUPPLY — 3 items
KIT CATH DIALYSIS TRI 20X13 (CATHETERS) ×2 IMPLANT
PACK ANGIOGRAPHY (CUSTOM PROCEDURE TRAY) ×4 IMPLANT
SET INTRO CAPELLA COAXIAL (SET/KITS/TRAYS/PACK) ×2 IMPLANT

## 2019-02-20 NOTE — H&P (Addendum)
[facility] NAME: [name]    MR#:  [id number]  DATE OF BIRTH:  Jan 25, 1971  DATE OF ADMISSION:  02/20/2019  PRIMARY CARE PHYSICIAN: Dayton Martes Victoriano Lain, NP   REQUESTING/REFERRING PHYSICIAN: [name], MD  CHIEF COMPLAINT:  Malfunctioning AV fistula   HISTORY OF PRESENT ILLNESS:  [name]  is a 48 y.o. male with a known history of ESRD on HD MWF, hypertension, type 2 diabetes who was scheduled to undergo fistulogram with vascular surgery today.  His preop labs were significant for potassium of 6.0.  Temporary dialysis catheter was placed instead and patient was taken to urgent HD.  Patient states that his AV fistula has not been functioning correctly at dialysis.  His last successful dialysis session was 6/15.  He endorses worsening lower extremity edema over the last week.  He also endorses dyspnea on exertion.  He denies any orthopnea, shortness of breath at rest, or chest pain.  PAST MEDICAL HISTORY:   Past Medical History:  Diagnosis Date  . Anxiety   . Chronic kidney disease    14% FUNCTION  . Depression   . Diabetes mellitus without complication ([place])   . Dyspnea    DOE  . Dysrhythmia   . History of orthopnea   . Hypertension   . Lymphedema of leg   . Microalbuminuria   . Sleep apnea    no CPAP  . Vitamin D deficiency     PAST SURGICAL HISTORY:   Past Surgical History:  Procedure Laterality Date  . A/V FISTULAGRAM Right 10/24/2018   Procedure: A/V FISTULAGRAM;  Surgeon: Katha Cabal, MD;  Location: [facility];  Service: Cardiovascular;  Laterality: Right;  . A/V FISTULAGRAM Right 11/24/2018   Procedure: A/V FISTULAGRAM;  Surgeon: Katha Cabal, MD;  Location: [facility];  Service: Cardiovascular;  Laterality: Right;  . APPLICATION OF WOUND VAC Left 10/03/2017   Procedure: APPLICATION OF WOUND VAC;  Surgeon: Algernon Huxley, MD;  Location: [facility];  Service: General;   Laterality: Left;  . APPLICATION OF WOUND VAC Left 10/11/2017   Procedure: APPLICATION OF WOUND VAC;  Surgeon: Katha Cabal, MD;  Location: [facility];  Service: Vascular;  Laterality: Left;  . APPLICATION OF WOUND VAC Left 10/14/2017   Procedure: WOUND VAC CHANGE;  Surgeon: Katha Cabal, MD;  Location: [facility];  Service: Vascular;  Laterality: Left;  left lower leg  . APPLICATION OF WOUND VAC Left 10/18/2017   Procedure: WOUND VAC CHANGE;  Surgeon: Katha Cabal, MD;  Location: [facility];  Service: Vascular;  Laterality: Left;  . APPLICATION OF WOUND VAC Left 10/07/2017   Procedure: APPLICATION OF WOUND VAC;  Surgeon: Katha Cabal, MD;  Location: [facility];  Service: Vascular;  Laterality: Left;  . AV FISTULA INSERTION W/ RF MAGNETIC GUIDANCE Right 07/19/2018   Procedure: AV FISTULA INSERTION W/RF MAGNETIC GUIDANCE;  Surgeon: Katha Cabal, MD;  Location: [facility];  Service: Cardiovascular;  Laterality: Right;  . CATARACT EXTRACTION W/PHACO Left 11/01/2018   Procedure: CATARACT EXTRACTION PHACO AND INTRAOCULAR LENS PLACEMENT (IOC)-LEFT, DIABETIC-INSULIN DEPENDENT;  Surgeon: Eulogio Bear, MD;  Location: [facility];  Service: Ophthalmology;  Laterality: Left;  [place] 00:41.8 CDE 4.61 Fluid Pack Lot # [id number]  . I&D EXTREMITY Left 10/11/2017   Procedure: IRRIGATION AND DEBRIDEMENT EXTREMITY;  Surgeon: Katha Cabal, MD;  Location: [facility];  Service: Vascular;  Laterality: Left;  .  INCISION AND DRAINAGE ABSCESS Right 10/07/2017   Procedure: INCISION AND DRAINAGE ABSCESS;  Surgeon: Katha Cabal, MD;  Location: [facility];  Service: Vascular;  Laterality: Right;  . IRRIGATION AND DEBRIDEMENT ABSCESS Left 10/03/2017   Procedure: IRRIGATION AND DEBRIDEMENT ABSCESS with debridement of skin, soft tissue, muscle 50sq cm;  Surgeon: Algernon Huxley, MD;  Location: [facility];  Service: General;  Laterality: Left;    SOCIAL HISTORY:   Social History   Tobacco Use  .  Smoking status: Former Smoker    Types: Cigars  . Smokeless tobacco: Former Systems developer    Quit date: 09/30/2017  . Tobacco comment: occasional  Substance Use Topics  . Alcohol use: Yes    Alcohol/week: 1.0 standard drinks    Types: 1 Cans of beer per week    Comment: beer    FAMILY HISTORY:   Family History  Problem Relation Age of Onset  . Diabetes Father   . Kidney disease Father   . Diabetes Daughter     DRUG ALLERGIES:   Allergies  Allergen Reactions  . Lisinopril Swelling  . Adhesive [Tape] Itching  . Furosemide Other (See Comments)    Light headed, cough, blurry vision, weakness.    REVIEW OF SYSTEMS:   Review of Systems  Constitutional: Negative for chills and fever.  HENT: Negative for congestion and sore throat.   Eyes: Negative for blurred vision and double vision.  Respiratory: Negative for cough and shortness of breath.   Cardiovascular: Positive for leg swelling. Negative for chest pain.  Gastrointestinal: Negative for nausea and vomiting.  Genitourinary: Negative for dysuria and urgency.  Musculoskeletal: Negative for back pain and neck pain.  Neurological: Negative for dizziness and headaches.  Psychiatric/Behavioral: Negative for depression. The patient is not nervous/anxious.     MEDICATIONS AT HOME:   Prior to Admission medications   Medication Sig Start Date End Date Taking? Authorizing Provider  allopurinol (ZYLOPRIM) 100 MG tablet Take 100 mg by mouth daily. 05/11/18  Yes [provider]  amLODipine (NORVASC) 10 MG tablet Take 10 mg by mouth daily.  11/24/17  Yes [provider]  apixaban (ELIQUIS) 5 MG TABS tablet Take 5 mg by mouth 2 (two) times daily.   Yes [provider]  Ascorbic Acid (VITAMIN C WITH ROSE HIPS) 1000 MG tablet Take 1,000 mg by mouth daily.   Yes [provider]  atenolol (TENORMIN) 100 MG tablet Take 1 tablet (100 mg total) by mouth daily. 10/19/17  Yes Mody, Ulice Bold, MD  Insulin Glargine (LANTUS  SOLOSTAR) 100 UNIT/ML Solostar Pen Inject 12 Units into the skin every evening.  12/27/17  Yes [provider]  Melatonin 5 MG CAPS Take 5 mg by mouth at bedtime as needed (sleep).   Yes [provider]  metolazone (ZAROXOLYN) 5 MG tablet Take 5 mg by mouth every Monday, Wednesday, and Friday.    Yes [provider]  Multiple Vitamin (MULTIVITAMIN WITH MINERALS) TABS tablet Take 1 tablet by mouth daily. Libido max otc supplement   Yes [provider]  Multiple Vitamins-Minerals (EYE HEALTH PO) Take 1 capsule by mouth daily.    Yes [provider]  Multiple Vitamins-Minerals (MULTIVITAMIN PO) Take 1 tablet by mouth daily. Vein otc supplement   Yes [provider]  Omega-3 1000 MG CAPS Take 1,000 mg by mouth daily.   Yes [provider]  torsemide (DEMADEX) 20 MG tablet Take 100 mg by mouth once.  01/03/18  Yes [provider]  aspirin 325 MG EC tablet Take 325 mg by mouth daily.    [provider]  cloNIDine (CATAPRES) 0.1 MG tablet Take 0.1 mg by mouth 2 (two) times daily.  03/14/18   [provider]  clopidogrel (PLAVIX) 75 MG tablet Take 75 mg by mouth daily.    [provider]  ferrous gluconate (FERGON) 240 (27 FE) MG tablet Take 240 mg by mouth 2 (two) times a week.     [provider]  lidocaine-prilocaine (EMLA) cream Apply 1 application topically as needed (for fistula).    [provider]  OVER THE COUNTER MEDICATION Take 1 tablet by mouth daily. alpha king supreme testosterone booster otc supplement    [provider]      VITAL SIGNS:  Blood pressure (!) 155/80, pulse (!) 46, temperature 97.7 F (36.5 C), temperature source Oral, resp. rate 18, height 6\' 4"  (1.93 m), weight (!) 156.5 kg, SpO2 100 %.  PHYSICAL EXAMINATION:  Physical Exam  GENERAL:  48 y.o.-year-old patient lying in the bed with no acute distress.  EYES: Pupils equal, round, reactive to light and  accommodation. No scleral icterus. Extraocular muscles intact.  HEENT: Head atraumatic, normocephalic. Oropharynx and nasopharynx clear.  NECK:  Supple, no jugular venous distention. No thyroid enlargement, no tenderness.  LUNGS: Normal breath sounds bilaterally, no wheezing, rales,rhonchi or crepitation. No use of accessory muscles of respiration.  CARDIOVASCULAR: RRR, S1, S2 normal. No murmurs, rubs, or gallops.  ABDOMEN: Soft, nontender, nondistended. Bowel sounds present. No organomegaly or mass.  EXTREMITIES: No cyanosis, or clubbing. +right AVF. 3+ bilateral pitting edema in the lower extremities NEUROLOGIC: Cranial nerves II through XII are intact. Muscle strength 5/5 in all extremities. Sensation intact. Gait not checked.  PSYCHIATRIC: The patient is alert and oriented x 3.  SKIN: No obvious rash, lesion, or ulcer.   LABORATORY PANEL:   CBC No results for input(s): WBC, HGB, HCT, PLT in the last 168 hours. ------------------------------------------------------------------------------------------------------------------  Chemistries  No results for input(s): NA, K, CL, CO2, GLUCOSE, BUN, CREATININE, CALCIUM, MG, AST, ALT, ALKPHOS, BILITOT in the last 168 hours.  Invalid input(s): GFRCGP ------------------------------------------------------------------------------------------------------------------  Cardiac Enzymes No results for input(s): TROPONINI in the last 168 hours. ------------------------------------------------------------------------------------------------------------------  RADIOLOGY:  No results found.    IMPRESSION AND PLAN:   Hyperkalemia in ESRD on HD MWF- has not successfully received HD since 6/15 due to malfunctioning AVF. -Vascular surgery following- temporary HD cath placed 6/23, plan for fistulogram tomorrow -Nephrology consulted for HD -Continue home torsemide and metolazone -On eliquis due to AV fistula clotting, will hold for procedure tomorrow,  but plan to restart on discharge  History of gout- no signs of acute flare -Continue home allopurinol  Hypertension-blood pressure stable -Continue home Norvasc, atenolol, clonidine  Type 2 diabetes -Lantus 10 units nightly -SSI   All the records are reviewed and case discussed with ED provider. Management plans discussed with the patient, family and they are in agreement.  CODE STATUS: Full  TOTAL TIME TAKING CARE OF THIS PATIENT: 45 minutes.    Berna Spare Lacretia Tindall M.D on 02/20/2019 at 1:35 PM  Between 7am to 6pm - Pager - [phone]  After 6pm go to [email or web address] - Proofreader  [facility]  Office  [phone]  CC: Primary care physician; Sallee Lange, NP   Note: This dictation was prepared with Dragon dictation along with smaller phrase technology. Any transcriptional errors that result from this process are unintentional.

## 2019-02-20 NOTE — Op Note (Signed)
  OPERATIVE NOTE   PROCEDURE: 1. Insertion of temporary dialysis catheter catheter right IJ approach.  PRE-OPERATIVE DIAGNOSIS: Complication dialysis access; hyperkalemia  POST-OPERATIVE DIAGNOSIS: Same  SURGEON: Katha Cabal M.D.  ANESTHESIA: 1% lidocaine local infiltration  ESTIMATED BLOOD LOSS: Minimal cc  INDICATIONS:   [name] is a 48 y.o. male who presents with hyperkalemia.  We had planned for fistulogram as they have been unable to cannulate his fistula recently.  Potassium was checked prior to the procedure and he was noted to be markedly hyperkalemic and therefore is undergoing temporary catheter insertion for immediate dialysis and will undergo evaluation of his fistula subsequent to that.  Risks and benefits of been reviewed patient agrees to proceed  DESCRIPTION: After obtaining full informed written consent, the patient was positioned supine. The right neck was prepped and draped in a sterile fashion. Ultrasound was placed in a sterile sleeve. Ultrasound was utilized to identify the right internal jugular vein which is noted to be echolucent and compressible indicating patency. Images recorded for the permanent record. Under real-time visualization a Seldinger needle is inserted into the vein and the guidewires advanced without difficulty. Small counterincision was made at the wire insertion site. Dilator is passed over the wire and the temporary dialysis catheter catheter is fed over the wire without difficulty.  All lumens aspirate and flush easily and are packed with heparin saline. Catheter secured to the skin of the right neck with 2-0 silk. A sterile dressing is applied with Biopatch.  COMPLICATIONS: None  CONDITION: Unchanged  [name] Office:  [phone] 02/20/2019, 5:12 PM

## 2019-02-20 NOTE — Progress Notes (Signed)
[facility] [place], [place] 02/20/19  Subjective:   LOS: 0 No intake/output data recorded. Patient known to our practice from outpatient dialysis.  He has been having difficulty with cannulation of his access.  He came in for an angiogram but was found to have elevated potassium of 6.0.  It was deemed not safe to proceed with the procedure therefore, he was admitted for dialysis and angiogram will be performed tomorrow. Patient denies any acute complaints or shortness of breath No cough, fever  Objective:  Vital signs in last 24 hours:  Temp:  [97.7 F (36.5 C)-97.8 F (36.6 C)] 97.8 F (36.6 C) (06/23 1340) Pulse Rate:  [46-53] 49 (06/23 1645) Resp:  [18-21] 20 (06/23 1645) BP: (127-167)/(64-100) 127/83 (06/23 1645) SpO2:  [100 %] 100 % (06/23 1340) Weight:  [156.5 kg] 156.5 kg (06/23 1151)  Weight change:  Filed Weights   02/20/19 1151  Weight: (!) 156.5 kg    Intake/Output:   No intake or output data in the 24 hours ending 02/20/19 1722   Physical Exam: General:  Lying in the bed, no acute distress  HEENT  anicteric, moist oral mucous membranes  Neck  supple, no masses  Pulm/lungs  normal respiratory effort on room air, no crackles  CVS/Heart  regular rhythm, bradycardic  Abdomen:   Soft, nontender  Extremities:  Chronic lymphedema, also has 2-3+ pitting edema  Neurologic:  Alert, oriented  Skin:  No acute rashes  Access:  Right IJ temp cath       Basic Metabolic Panel:  Recent Labs  Lab 02/20/19 1607  K 4.3     CBC: No results for input(s): WBC, NEUTROABS, HGB, HCT, MCV, PLT in the last 168 hours.   No results found for: HEPBSAG, HEPBSAB, HEPBIGM    Microbiology:  Recent Results (from the past 240 hour(s))  SARS Coronavirus 2 (CEPHEID - Performed in [facility] hospital lab), Hosp Order     Status: None   Collection Time: 02/20/19  8:50 AM   Specimen: Nasopharyngeal Swab  Result Value Ref Range Status   SARS Coronavirus 2  NEGATIVE NEGATIVE Final    Comment: (NOTE) If result is NEGATIVE SARS-CoV-2 target nucleic acids are NOT DETECTED. The SARS-CoV-2 RNA is generally detectable in upper and lower  respiratory specimens during the acute phase of infection. The lowest  concentration of SARS-CoV-2 viral copies this assay can detect is 250  copies / mL. A negative result does not preclude SARS-CoV-2 infection  and should not be used as the sole basis for treatment or other  patient management decisions.  A negative result may occur with  improper specimen collection / handling, submission of specimen other  than nasopharyngeal swab, presence of viral mutation(s) within the  areas targeted by this assay, and inadequate number of viral copies  (<250 copies / mL). A negative result must be combined with clinical  observations, patient history, and epidemiological information. If result is POSITIVE SARS-CoV-2 target nucleic acids are DETECTED. The SARS-CoV-2 RNA is generally detectable in upper and lower  respiratory specimens dur ing the acute phase of infection.  Positive  results are indicative of active infection with SARS-CoV-2.  Clinical  correlation with patient history and other diagnostic information is  necessary to determine patient infection status.  Positive results do  not rule out bacterial infection or co-infection with other viruses. If result is PRESUMPTIVE POSTIVE SARS-CoV-2 nucleic acids MAY BE PRESENT.   A presumptive positive result was obtained on the submitted  specimen  and confirmed on repeat testing.  While 2019 novel coronavirus  (SARS-CoV-2) nucleic acids may be present in the submitted sample  additional confirmatory testing may be necessary for epidemiological  and / or clinical management purposes  to differentiate between  SARS-CoV-2 and other Sarbecovirus currently known to infect humans.  If clinically indicated additional testing with an alternate test  methodology [phone])  is advised. The SARS-CoV-2 RNA is generally  detectable in upper and lower respiratory sp ecimens during the acute  phase of infection. The expected result is Negative. Fact Sheet for Patients:  StrictlyIdeas.no Fact Sheet for Healthcare Providers: BankingDealers.co.za This test is not yet approved or cleared by the [place] FDA and has been authorized for detection and/or diagnosis of SARS-CoV-2 by FDA under an Emergency Use Authorization (EUA).  This EUA will remain in effect (meaning this test can be used) for the duration of the COVID-19 declaration under Section 564(b)(1) of the Act, 21 U.S.C. section 360bbb-3(b)(1), unless the authorization is terminated or revoked sooner. Performed at [facility], [address] [place], [place] [address]     Coagulation Studies: No results for input(s): LABPROT, INR in the last 72 hours.  Urinalysis: No results for input(s): COLORURINE, LABSPEC, PHURINE, GLUCOSEU, HGBUR, BILIRUBINUR, KETONESUR, PROTEINUR, UROBILINOGEN, NITRITE, LEUKOCYTESUR in the last 72 hours.  Invalid input(s): APPERANCEUR    Imaging: No results found.   Medications:    . allopurinol  100 mg Oral Daily  . amLODipine  10 mg Oral Daily  . aspirin  325 mg Oral Daily  . atenolol  100 mg Oral Daily  . cloNIDine  0.1 mg Oral BID  . [START ON 02/22/2019] ferrous gluconate  240 mg Oral 2 times weekly  . heparin  5,000 Units Subcutaneous Q8H  . insulin aspart  0-5 Units Subcutaneous QHS  . insulin aspart  0-9 Units Subcutaneous TID WC  . insulin glargine  10 Units Subcutaneous QHS  . [START ON 02/21/2019] metolazone  5 mg Oral Q M,W,F  . torsemide  100 mg Oral Once   acetaminophen **OR** acetaminophen, Melatonin, ondansetron **OR** ondansetron (ZOFRAN) IV, polyethylene glycol  Assessment/ Plan:  48 y.o. male with end-stage renal disease, diabetes, hypertension is admitted for management of hyperkalemia  and access malfunction  Active Problems:   Hyperkalemia   #.  Acute hyperkalemia in setting of end-stage renal disease -Managed with dialysis today via temporary dialysis cath  #. Anemia of CKD  Lab Results  Component Value Date   HGB 9.9 (L) 11/01/2018  Will continue to monitor  #. SHPTH  No results found for: PTH Lab Results  Component Value Date   PHOS 4.8 (H) 07/15/5207   #Complication of dialysis access Admitted for observation Angiogram planned for tomorrow   #. Diabetes type 2 with CKD Hgb A1c MFr Bld (%)  Date Value  10/01/2017 15.1 (H)   #Volume overload, with lower extremity edema Volume removal with HD as tolerated  #Hypertension with CKD Continue home antihypertensive regimen   LOS: 0 Hazleigh Mccleave 6/23/20205:22 PM  [place], [place]

## 2019-02-21 ENCOUNTER — Other Ambulatory Visit (INDEPENDENT_AMBULATORY_CARE_PROVIDER_SITE_OTHER): Payer: Self-pay | Admitting: Vascular Surgery

## 2019-02-21 ENCOUNTER — Encounter: Payer: Self-pay | Admitting: Vascular Surgery

## 2019-02-21 ENCOUNTER — Encounter
Admission: RE | Disposition: A | Discharge status: Home / Self Care | Payer: Self-pay | Source: Home / Self Care | Attending: Internal Medicine

## 2019-02-21 DIAGNOSIS — T82868A Thrombosis of vascular prosthetic devices, implants and grafts, initial encounter: Secondary | ICD-10-CM

## 2019-02-21 DIAGNOSIS — Z992 Dependence on renal dialysis: Secondary | ICD-10-CM

## 2019-02-21 DIAGNOSIS — N186 End stage renal disease: Secondary | ICD-10-CM

## 2019-02-21 HISTORY — PX: A/V SHUNT INTERVENTION: CATH118220

## 2019-02-21 LAB — CBC
HCT: 31.8 % — ABNORMAL LOW (ref 39.0–52.0)
Hemoglobin: 10.5 g/dL — ABNORMAL LOW (ref 13.0–17.0)
MCH: 30.3 pg (ref 26.0–34.0)
MCHC: 33 g/dL (ref 30.0–36.0)
MCV: 91.9 fL (ref 80.0–100.0)
Platelets: 136 10*3/uL — ABNORMAL LOW (ref 150–400)
RBC: 3.46 MIL/uL — ABNORMAL LOW (ref 4.22–5.81)
RDW: 13.7 % (ref 11.5–15.5)
WBC: 4.7 10*3/uL (ref 4.0–10.5)
nRBC: 0 % (ref 0.0–0.2)

## 2019-02-21 LAB — BASIC METABOLIC PANEL
Anion gap: 13 (ref 5–15)
BUN: 93 mg/dL — ABNORMAL HIGH (ref 6–20)
CO2: 26 mmol/L (ref 22–32)
Calcium: 8.3 mg/dL — ABNORMAL LOW (ref 8.9–10.3)
Chloride: 100 mmol/L (ref 98–111)
Creatinine, Ser: 13.07 mg/dL — ABNORMAL HIGH (ref 0.61–1.24)
GFR calc Af Amer: 5 mL/min — ABNORMAL LOW (ref >60)
GFR calc non Af Amer: 4 mL/min — ABNORMAL LOW (ref >60)
Glucose, Bld: 149 mg/dL — ABNORMAL HIGH (ref 70–99)
Potassium: 4.5 mmol/L (ref 3.5–5.1)
Sodium: 139 mmol/L (ref 135–145)

## 2019-02-21 LAB — GLUCOSE, CAPILLARY
Glucose-Capillary: 116 mg/dL — ABNORMAL HIGH (ref 70–99)
Glucose-Capillary: 102 mg/dL — ABNORMAL HIGH (ref 70–99)
Glucose-Capillary: 66 mg/dL — ABNORMAL LOW (ref 70–99)
Glucose-Capillary: 143 mg/dL — ABNORMAL HIGH (ref 70–99)

## 2019-02-21 LAB — MRSA PCR SCREENING: MRSA by PCR: NEGATIVE

## 2019-02-21 SURGERY — A/V SHUNT INTERVENTION
Anesthesia: Moderate Sedation | Laterality: Right

## 2019-02-21 MED ORDER — ONDANSETRON HCL 4 MG/2ML IJ SOLN: 4.0000 mg | Freq: Four times a day (QID) | INTRAMUSCULAR | Status: DC | PRN

## 2019-02-21 MED ORDER — NITROGLYCERIN 1 MG/10 ML FOR IR/CATH LAB
INTRA_ARTERIAL | Status: DC | PRN
  Administered 2019-02-21 (×2): 400 ug via INTRA_ARTERIAL

## 2019-02-21 MED ORDER — DIPHENHYDRAMINE HCL 50 MG/ML IJ SOLN: 50.0000 mg | Freq: Once | INTRAMUSCULAR | Status: DC | PRN

## 2019-02-21 MED ORDER — HYDROMORPHONE HCL 1 MG/ML IJ SOLN: 1.0000 mg | Freq: Once | INTRAMUSCULAR | Status: DC | PRN

## 2019-02-21 MED ORDER — CEFAZOLIN SODIUM-DEXTROSE 1-4 GM/50ML-% IV SOLN
INTRAVENOUS | Status: AC
  Filled 2019-02-21: qty 50

## 2019-02-21 MED ORDER — MIDAZOLAM HCL 2 MG/ML PO SYRP: 8.0000 mg | ORAL_SOLUTION | Freq: Once | ORAL | Status: DC | PRN

## 2019-02-21 MED ORDER — FAMOTIDINE 20 MG PO TABS: 40.0000 mg | ORAL_TABLET | Freq: Once | ORAL | Status: DC | PRN

## 2019-02-21 MED ORDER — ATENOLOL 50 MG PO TABS: 50.0000 mg | ORAL_TABLET | Freq: Every day | ORAL | Status: DC

## 2019-02-21 MED ORDER — IODIXANOL 320 MG/ML IV SOLN
INTRAVENOUS | Status: DC | PRN
  Administered 2019-02-21: 45 mL via INTRAVENOUS

## 2019-02-21 MED ORDER — FENTANYL CITRATE (PF) 100 MCG/2ML IJ SOLN
INTRAMUSCULAR | Status: AC
  Filled 2019-02-21: qty 2

## 2019-02-21 MED ORDER — CEFAZOLIN SODIUM-DEXTROSE 1-4 GM/50ML-% IV SOLN: 1.0000 g | Freq: Once | INTRAVENOUS | Status: DC

## 2019-02-21 MED ORDER — METHYLPREDNISOLONE SODIUM SUCC 125 MG IJ SOLR: 125.0000 mg | Freq: Once | INTRAMUSCULAR | Status: DC | PRN

## 2019-02-21 MED ORDER — HEPARIN SODIUM (PORCINE) 1000 UNIT/ML IJ SOLN
INTRAMUSCULAR | Status: DC | PRN
  Administered 2019-02-21: 3000 [IU] via INTRAVENOUS

## 2019-02-21 MED ORDER — HEPARIN SODIUM (PORCINE) 1000 UNIT/ML IJ SOLN
INTRAMUSCULAR | Status: AC
  Filled 2019-02-21: qty 1

## 2019-02-21 MED ORDER — SODIUM CHLORIDE 0.9 % IV SOLN
INTRAVENOUS | Status: DC
  Administered 2019-02-21: 15:00:00 via INTRAVENOUS

## 2019-02-21 MED ORDER — MIDAZOLAM HCL 2 MG/2ML IJ SOLN
INTRAMUSCULAR | Status: DC | PRN
  Administered 2019-02-21: 2 mg via INTRAVENOUS
  Administered 2019-02-21 (×2): 1 mg via INTRAVENOUS

## 2019-02-21 MED ORDER — ATENOLOL 50 MG PO TABS: 50.0000 mg | ORAL_TABLET | Freq: Every day | ORAL | 0 refills | Status: DC

## 2019-02-21 MED ORDER — MIDAZOLAM HCL 5 MG/5ML IJ SOLN
INTRAMUSCULAR | Status: AC
  Filled 2019-02-21: qty 5

## 2019-02-21 MED ORDER — FENTANYL CITRATE (PF) 100 MCG/2ML IJ SOLN
INTRAMUSCULAR | Status: DC | PRN
  Administered 2019-02-21 (×2): 25 ug via INTRAVENOUS
  Administered 2019-02-21: 50 ug via INTRAVENOUS
  Administered 2019-02-21: 25 ug via INTRAVENOUS

## 2019-02-21 SURGICAL SUPPLY — 22 items
BALLN DORADO 8X100X80 (BALLOONS) ×3
BALLN LUTONIX DCB 7X40X130 (BALLOONS) ×3
BALLOON DORADO 8X100X80 (BALLOONS) ×1 IMPLANT
BALLOON LUTONIX DCB 7X40X130 (BALLOONS) ×1 IMPLANT
CATH BEACON 5 .035 40 KMP TP (CATHETERS) ×1 IMPLANT
CATH BEACON 5 .038 40 KMP TP (CATHETERS) ×2
COVER PROBE U/S 5X48 (MISCELLANEOUS) ×3 IMPLANT
DEVICE PRESTO INFLATION (MISCELLANEOUS) ×3 IMPLANT
DEVICE TORQUE .025-.038 (MISCELLANEOUS) ×3 IMPLANT
GUIDEWIRE ANGLED .035 180CM (WIRE) ×3 IMPLANT
KIT THROMB PERC PTD (MISCELLANEOUS) ×3 IMPLANT
NEEDLE ENTRY 21GA 7CM ECHOTIP (NEEDLE) ×3 IMPLANT
PACK ANGIOGRAPHY (CUSTOM PROCEDURE TRAY) ×3 IMPLANT
SET INTRO CAPELLA COAXIAL (SET/KITS/TRAYS/PACK) ×3 IMPLANT
SHEATH BRITE TIP 6FRX5.5 (SHEATH) ×3 IMPLANT
SHEATH BRITE TIP 7FRX5.5 (SHEATH) ×3 IMPLANT
STENT VIABAHN 8X150X120 (Permanent Stent) ×2 IMPLANT
STENT VIABAHN 8X15X120 7FR (Permanent Stent) ×1 IMPLANT
STENT VIABAHN 8X2.5X120 (Permanent Stent) ×1 IMPLANT
STENT VIABAHN 8X25X120 (Permanent Stent) ×2 IMPLANT
SUT MNCRL AB 4-0 PS2 18 (SUTURE) ×3 IMPLANT
WIRE G 018X200 V18 (WIRE) ×3 IMPLANT

## 2019-02-21 NOTE — Progress Notes (Signed)
[facility], [place] 02/21/19  Subjective:   LOS: 0 06/23 0701 - 06/24 0700 In: -  Out: 4500  Patient known to our practice from outpatient dialysis.  He has been having difficulty with cannulation of his access.  He came in for an angiogram but was found to have elevated potassium of 6.0.  It was deemed not safe to proceed with the procedure therefore, he was admitted for dialysis and angiogram   Today, he feels well. Potassium is in normal range Patient denies any acute complaints or shortness of breath No cough, fever  Objective:  Vital signs in last 24 hours:  Temp:  [97.3 F (36.3 C)-98.3 F (36.8 C)] 97.3 F (36.3 C) (06/24 1717) Pulse Rate:  [44-62] 45 (06/24 1717) Resp:  [9-24] 20 (06/24 1717) BP: (117-178)/(48-82) 132/62 (06/24 1717) SpO2:  [98 %-100 %] 100 % (06/24 1717)  Weight change:  Filed Weights   02/20/19 1151  Weight: (!) 156.5 kg    Intake/Output:   No intake or output data in the 24 hours ending 02/21/19 1725   Physical Exam: General:  Lying in the bed, no acute distress  HEENT  anicteric, moist oral mucous membranes  Neck  supple, no masses  Pulm/lungs  normal respiratory effort on room air, no crackles  CVS/Heart  regular rhythm, bradycardic  Abdomen:   Soft, nontender  Extremities:  Chronic lymphedema, also has 2-3+ pitting edema  Neurologic:  Alert, oriented  Skin:  No acute rashes  Access:  Right IJ temp cath       Basic Metabolic Panel:  Recent Labs  Lab 02/20/19 1607 02/21/19 0242  NA  --  139  K 4.3 4.5  CL  --  100  CO2  --  26  GLUCOSE  --  149*  BUN  --  93*  CREATININE  --  13.07*  CALCIUM  --  8.3*     CBC: Recent Labs  Lab 02/21/19 0242  WBC 4.7  HGB 10.5*  HCT 31.8*  MCV 91.9  PLT 136*     No results found for: HEPBSAG, HEPBSAB, HEPBIGM    Microbiology:  Recent Results (from the past 240 hour(s))  SARS Coronavirus 2 (CEPHEID - Performed in [place] hospital lab), Hosp  Order     Status: None   Collection Time: 02/20/19  8:50 AM   Specimen: Nasopharyngeal Swab  Result Value Ref Range Status   SARS Coronavirus 2 NEGATIVE NEGATIVE Final    Comment: (NOTE) If result is NEGATIVE SARS-CoV-2 target nucleic acids are NOT DETECTED. The SARS-CoV-2 RNA is generally detectable in upper and lower  respiratory specimens during the acute phase of infection. The lowest  concentration of SARS-CoV-2 viral copies this assay can detect is 250  copies / mL. A negative result does not preclude SARS-CoV-2 infection  and should not be used as the sole basis for treatment or other  patient management decisions.  A negative result may occur with  improper specimen collection / handling, submission of specimen other  than nasopharyngeal swab, presence of viral mutation(s) within the  areas targeted by this assay, and inadequate number of viral copies  (<250 copies / mL). A negative result must be combined with clinical  observations, patient history, and epidemiological information. If result is POSITIVE SARS-CoV-2 target nucleic acids are DETECTED. The SARS-CoV-2 RNA is generally detectable in upper and lower  respiratory specimens dur ing the acute phase of infection.  Positive  results are indicative of  active infection with SARS-CoV-2.  Clinical  correlation with patient history and other diagnostic information is  necessary to determine patient infection status.  Positive results do  not rule out bacterial infection or co-infection with other viruses. If result is PRESUMPTIVE POSTIVE SARS-CoV-2 nucleic acids MAY BE PRESENT.   A presumptive positive result was obtained on the submitted specimen  and confirmed on repeat testing.  While 2019 novel coronavirus  (SARS-CoV-2) nucleic acids may be present in the submitted sample  additional confirmatory testing may be necessary for epidemiological  and / or clinical management purposes  to differentiate between  SARS-CoV-2  and other Sarbecovirus currently known to infect humans.  If clinically indicated additional testing with an alternate test  methodology [phone]) is advised. The SARS-CoV-2 RNA is generally  detectable in upper and lower respiratory sp ecimens during the acute  phase of infection. The expected result is Negative. Fact Sheet for Patients:  StrictlyIdeas.no Fact Sheet for Healthcare Providers: BankingDealers.co.za This test is not yet approved or cleared by the [place] FDA and has been authorized for detection and/or diagnosis of SARS-CoV-2 by FDA under an Emergency Use Authorization (EUA).  This EUA will remain in effect (meaning this test can be used) for the duration of the COVID-19 declaration under Section 564(b)(1) of the Act, 21 U.S.C. section 360bbb-3(b)(1), unless the authorization is terminated or revoked sooner. Performed at [facility], [address] [place], [place] [address]   MRSA PCR Screening     Status: None   Collection Time: 02/20/19  8:14 PM   Specimen: Nasopharyngeal  Result Value Ref Range Status   MRSA by PCR NEGATIVE NEGATIVE Final    Comment:        The GeneXpert MRSA Assay (FDA approved for NASAL specimens only), is one component of a comprehensive MRSA colonization surveillance program. It is not intended to diagnose MRSA infection nor to guide or monitor treatment for MRSA infections. Performed at [facility], [address] [place], [place] [address]     Coagulation Studies: No results for input(s): LABPROT, INR in the last 72 hours.  Urinalysis: No results for input(s): COLORURINE, LABSPEC, PHURINE, GLUCOSEU, HGBUR, BILIRUBINUR, KETONESUR, PROTEINUR, UROBILINOGEN, NITRITE, LEUKOCYTESUR in the last 72 hours.  Invalid input(s): APPERANCEUR    Imaging: No results found.   Medications:   . ceFAZolin     . allopurinol  100 mg Oral Daily  . amLODipine  10 mg  Oral Daily  . aspirin  325 mg Oral Daily  . [START ON 02/22/2019] atenolol  50 mg Oral Daily  . fentaNYL      . fentaNYL      . [START ON 02/22/2019] ferrous gluconate  324 mg Oral 2 times weekly  . heparin      . heparin  5,000 Units Subcutaneous Q8H  . insulin aspart  0-5 Units Subcutaneous QHS  . insulin aspart  0-9 Units Subcutaneous TID WC  . insulin glargine  10 Units Subcutaneous QHS  . metolazone  5 mg Oral Q M,W,F  . midazolam       acetaminophen **OR** acetaminophen, HYDROmorphone (DILAUDID) injection, Melatonin, ondansetron **OR** ondansetron (ZOFRAN) IV, ondansetron (ZOFRAN) IV, polyethylene glycol  Assessment/ Plan:  48 y.o. male with end-stage renal disease, diabetes, hypertension is admitted for management of hyperkalemia and access malfunction  Active Problems:   Hyperkalemia   #.  Acute hyperkalemia in setting of end-stage renal disease -Managed with dialysis via temporary dialysis cath - Potassium is normal now -Patient  will go to outpatient dialysis tomorrow  #. Anemia of CKD  Lab Results  Component Value Date   HGB 10.5 (L) 02/21/2019  Will continue to monitor  #. SHPTH  No results found for: PTH Lab Results  Component Value Date   PHOS 4.8 (H) 74/03/1447   #Complication of dialysis access Admitted for observation Angiogram today Patient will continue his Eliquis as outpatient for clot prevention   #. Diabetes type 2 with CKD Hgb A1c MFr Bld (%)  Date Value  10/01/2017 15.1 (H)   #Volume overload, with lower extremity edema Volume removal with HD as tolerated. 4500 cc removed  #Hypertension with CKD Continue home antihypertensive regimen    LOS: 0 Cordale Manera 6/24/20205:25 PM  [place], [place]

## 2019-02-21 NOTE — Progress Notes (Signed)
Patient escorted by wheelchair to private motor vehicle

## 2019-02-21 NOTE — Care Management Obs Status (Signed)
[facility] NOTIFICATION   Patient Details  Name: RONDEL EPISCOPO MRN: [id number] Date of Birth: 1971/01/25   Medicare Observation Status Notification Given:  No(admitted obs less than 24 hours)    Beverly Sessions, RN 02/21/2019, 2:05 PM

## 2019-02-21 NOTE — Progress Notes (Signed)
MD ordered patient to be discharged home.  Discharge instructions were reviewed with the patient and he voiced understanding.  Follow-up appointment was made.  No prescriptions given to the patient.  IV was removed with catheter intact.  Dressing to the right neck is C/D/I after removal of temp HD cathether.  All patients questions were answered.

## 2019-02-21 NOTE — Discharge Summary (Signed)
[name]    MR#:  [id number]  DATE OF BIRTH:  April 02, 1971  DATE OF ADMISSION:  02/20/2019   ADMITTING PHYSICIAN: Sela Hua, MD  DATE OF DISCHARGE: 02/21/19  PRIMARY CARE PHYSICIAN: Sallee Lange, NP   ADMISSION DIAGNOSIS:  Hyperkalemia [E87.5] DISCHARGE DIAGNOSIS:  Active Problems:   Hyperkalemia  SECONDARY DIAGNOSIS:   Past Medical History:  Diagnosis Date   Anxiety    Chronic kidney disease    14% FUNCTION   Depression    Diabetes mellitus without complication ([place])    Dyspnea    DOE   Dysrhythmia    History of orthopnea    Hypertension    Lymphedema of leg    Microalbuminuria    Sleep apnea    no CPAP   Vitamin D deficiency    HOSPITAL COURSE:   [name] is a 48 year old male who was scheduled to undergo fistulogram with vascular surgery, but his preop labs were significant for potassium of 6.0.  He had a dialysis catheter placed on 6/23 instead and received urgent dialysis.  His hyperkalemia resolved after dialysis.  He underwent fistulogram with vascular surgery on 02/21/2019.  He will need to follow-up with nephrology and vascular surgery as an outpatient.  DISCHARGE CONDITIONS:  ESRD on HD MWF History of gout Hypertension Type 2 diabetes CONSULTS OBTAINED:  Treatment Team:  Loletha Grayer, MD DRUG ALLERGIES:   Allergies  Allergen Reactions   Lisinopril Swelling   Adhesive [Tape] Itching   Furosemide Other (See Comments)    Light headed, cough, blurry vision, weakness.   DISCHARGE MEDICATIONS:   Allergies as of 02/21/2019      Reactions   Lisinopril Swelling   Adhesive [tape] Itching   Furosemide Other (See Comments)   Light headed, cough, blurry vision, weakness.      Medication List    TAKE these medications   allopurinol 100 MG tablet Commonly known as: ZYLOPRIM Take 100 mg by mouth daily.   amLODipine 10 MG tablet Commonly known  as: NORVASC Take 10 mg by mouth daily.   apixaban 5 MG Tabs tablet Commonly known as: ELIQUIS Take 5 mg by mouth 2 (two) times daily.   atenolol 50 MG tablet Commonly known as: TENORMIN Take 1 tablet (50 mg total) by mouth daily. Start taking on: February 22, 2019 What changed:   medication strength  how much to take   calcium acetate 667 MG capsule Commonly known as: PHOSLO Take 1,334 mg by mouth 3 (three) times daily with meals.   Cinnamon 500 MG capsule Take 500 mg by mouth daily.   EYE HEALTH PO Take 1 capsule by mouth daily.   Lantus SoloStar 100 UNIT/ML Solostar Pen Generic drug: Insulin Glargine Inject 10-20 Units into the skin 2 (two) times daily.   lidocaine-prilocaine cream Commonly known as: EMLA Apply 1 application topically as needed (for fistula).   Melatonin 5 MG Caps Take 5 mg by mouth at bedtime as needed (sleep).   metolazone 5 MG tablet Commonly known as: ZAROXOLYN Take 5 mg by mouth every Monday, Wednesday, and Friday.   multivitamin with minerals Tabs tablet Take 1 tablet by mouth daily.   Omega-3 1000 MG Caps Take 1,000 mg by mouth daily.   OVER THE COUNTER MEDICATION Take 1 tablet by mouth daily. Alpha King Supreme testosterone booster   OVER THE COUNTER MEDICATION Take 1 capsule by mouth daily. Nugenix testosterone booster  torsemide 20 MG tablet Commonly known as: DEMADEX Take 100 mg by mouth once.   vitamin C with rose hips 1000 MG tablet Take 1,000 mg by mouth daily.        DISCHARGE INSTRUCTIONS:  1.  Follow-up with PCP in 5 days DIET:  Renal diet DISCHARGE CONDITION:  Stable ACTIVITY:  Activity as tolerated OXYGEN:  Home Oxygen: No.  Oxygen Delivery: room air DISCHARGE LOCATION:  home   If you experience worsening of your admission symptoms, develop shortness of breath, life threatening emergency, suicidal or homicidal thoughts you must seek medical attention immediately by calling 911 or calling your MD  immediately  if symptoms less severe.  You Must read complete instructions/literature along with all the possible adverse reactions/side effects for all the Medicines you take and that have been prescribed to you. Take any new Medicines after you have completely understood and accpet all the possible adverse reactions/side effects.   Please note  You were cared for by a hospitalist during your hospital stay. If you have any questions about your discharge medications or the care you received while you were in the hospital after you are discharged, you can call the unit and asked to speak with the hospitalist on call if the hospitalist that took care of you is not available. Once you are discharged, your primary care physician will handle any further medical issues. Please note that NO REFILLS for any discharge medications will be authorized once you are discharged, as it is imperative that you return to your primary care physician (or establish a relationship with a primary care physician if you do not have one) for your aftercare needs so that they can reassess your need for medications and monitor your lab values.    On the day of Discharge:  VITAL SIGNS:  Blood pressure (!) 178/82, pulse (!) 47, temperature 98.2 F (36.8 C), temperature source Oral, resp. rate (!) 23, height 6\' 4"  (1.93 m), weight (!) 156.5 kg, SpO2 100 %. PHYSICAL EXAMINATION:  GENERAL:  48 y.o.-year-old patient lying in the bed with no acute distress.  EYES: Pupils equal, round, reactive to light and accommodation. No scleral icterus. Extraocular muscles intact.  HEENT: Head atraumatic, normocephalic. Oropharynx and nasopharynx clear.  NECK:  Supple, no jugular venous distention. No thyroid enlargement, no tenderness. +Right IJ temp cath. LUNGS: Normal breath sounds bilaterally, no wheezing, rales,rhonchi or crepitation. No use of accessory muscles of respiration.  CARDIOVASCULAR: RRR, S1, S2 normal. No murmurs, rubs, or  gallops.  ABDOMEN: Soft, non-tender, non-distended. Bowel sounds present. No organomegaly or mass.  EXTREMITIES: No cyanosis, or clubbing. 2-3+ pitting edema. NEUROLOGIC: Cranial nerves II through XII are intact. Muscle strength 5/5 in all extremities. Sensation intact. Gait not checked.  PSYCHIATRIC: The patient is alert and oriented x 3.  SKIN: No obvious rash, lesion, or ulcer.  DATA REVIEW:   CBC Recent Labs  Lab 02/21/19 0242  WBC 4.7  HGB 10.5*  HCT 31.8*  PLT 136*    Chemistries  Recent Labs  Lab 02/21/19 0242  NA 139  K 4.5  CL 100  CO2 26  GLUCOSE 149*  BUN 93*  CREATININE 13.07*  CALCIUM 8.3*     Microbiology Results  Results for orders placed or performed during the hospital encounter of 02/20/19  MRSA PCR Screening     Status: None   Collection Time: 02/20/19  8:14 PM   Specimen: Nasopharyngeal  Result Value Ref Range Status   MRSA by PCR NEGATIVE  NEGATIVE Final    Comment:        The GeneXpert MRSA Assay (FDA approved for NASAL specimens only), is one component of a comprehensive MRSA colonization surveillance program. It is not intended to diagnose MRSA infection nor to guide or monitor treatment for MRSA infections. Performed at [facility], [address] [place], [place] [address]     RADIOLOGY:  No results found.   Management plans discussed with the patient, family and they are in agreement.  CODE STATUS: Full Code   TOTAL TIME TAKING CARE OF THIS PATIENT: 35 minutes.    Berna Spare Deane Melick M.D on 02/21/2019 at 3:10 PM  Between 7am to 6pm - Pager - [phone]  After 6pm go to [email or web address] - Proofreader  [facility]  Office  [phone]  CC: Primary care physician; Sallee Lange, NP   Note: This dictation was prepared with Dragon dictation along with smaller phrase technology. Any transcriptional errors that result from this process are unintentional.

## 2019-02-21 NOTE — Op Note (Signed)
OPERATIVE NOTE   PROCEDURE: 1. Contrast injection right cephalic fistula 2. Mechanical thrombectomy with Trerotola device right arm fistula 3. Percutaneous transluminal angioplasty and stent placement peripheral segment right arm AV fistula  PRE-OPERATIVE DIAGNOSIS: Complication of dialysis access                                                       End Stage Renal Disease  POST-OPERATIVE DIAGNOSIS: same as above   SURGEON: Katha Cabal, M.D.  ANESTHESIA: Conscious sedation was administered by the radiology RN under my direct supervision. IV Versed plus fentanyl were utilized. Continuous ECG, pulse oximetry and blood pressure was monitored throughout the entire procedure. Conscious sedation was for a total of 40 minutes.    ESTIMATED BLOOD LOSS: minimal  FINDING(S): 1. Thrombus within the cephalic vein from the antecubital fossa to the proximal arm.  Stricture uncovered in the cephalic vein proper peripheral segment  SPECIMEN(S):  None  CONTRAST: 45 cc  FLUOROSCOPY TIME: 7.3 minutes  INDICATIONS: [name] is a 48 y.o. male who  presents with malfunctioning right arm AV access.  The patient is scheduled for angiography with possible intervention of the AV access.  The patient is aware the risks include but are not limited to: bleeding, infection, thrombosis of the cannulated access, and possible anaphylactic reaction to the contrast.  The patient acknowledges if the access can not be salvaged a tunneled catheter will be needed and will be placed during this procedure.  The patient is aware of the risks of the procedure and elects to proceed with the angiogram and intervention.  DESCRIPTION: After full informed written consent was obtained, the patient was brought back to the Special Procedure suite and placed supine position.  Appropriate cardiopulmonary monitors were placed.  The right arm was prepped and draped in the standard fashion.  Appropriate timeout is called. The  right arm fistula was cannulated with a micropuncture needle using ultrasound guidance.  With the ultrasound the AV access appeared to be filled with heterogeneous material and was poorly compressible indicating thrombosis of the AV access. The puncture was made under direct ultrasound visualization and an image was recorded for the permanent record.  The microwire was advanced and the needle was exchanged for  a microsheath.  The J-wire was then advanced and a 6 Fr sheath inserted.  Hand was then performed which demonstrated thrombus within the AV access.  The central venous structures were also imaged by hand injections.  This showed thrombus within the cephalic vein.  The arterial anastomosis appears patent central veins there is a 40% stenosis of the proximal subclavian this does not appear to be hemodynamically significant.  3000 units of heparin was given and allowed to circulate as well.  A Trerotola device was then advanced beginning centrally and pulling back performing.  Several passes were made through the venous portion of the graft. Follow-up imaging now demonstrates the vast majority of the clot had been treated.   V 18 wire was then advanced through the antegrade sheath and an 8 x 150 mm via bond stent was deployed.  A second 8 x 25 mm stent was then added.  The portion of the stent at the level of the antecubital fossa was dilated with a 7 mm x 40 mm Lutonix drug-eluting balloon inflated to 10 atm  for 1 minute.  The remaining portions of the stent were dilated with an 8 mm x 100 mm [place] balloon inflations were to approximately 10 atm for 30 seconds.  Twice during the case I administered 400 mcg total of nitroglycerin into the fistula to assist with spasm of the cephalic vein.  This was very effective.    Once the thrombus had been removed and area of greater than 60% narrowing was noted in the mid cephalic vein.  Also in a large dilated area there was some residual thrombus which I wish  to exclude and therefore I elected to use the via bond stent.  Once the stent had been placed in postdilated the fistula was now widely patent with less than 5% residual stenosis throughout its course.  Central venous structures were preserved.  A 4-0 Monocryl purse-string suture was sewn around both of the sheaths.  The sheaths were removed and light pressure was applied.  A sterile bandage was applied to the puncture site.    COMPLICATIONS: None  CONDITION: Carlynn Purl, M.D [facility] Office: [phone]  02/21/2019 4:33 PM

## 2019-02-21 NOTE — Progress Notes (Signed)
Established hemodialysis patient known at [facility] MWF 2:00. No changes in chair time at discharge.   [name] Dialysis Coordinator [phone]

## 2019-02-22 ENCOUNTER — Encounter: Payer: Self-pay | Admitting: Vascular Surgery

## 2019-02-22 ENCOUNTER — Inpatient Hospital Stay
Admission: AD | Admit: 2019-02-22 | Discharge: 2019-02-25 | DRG: 252 | Disposition: A | Discharge status: Home / Self Care | Payer: Medicare Other | Source: Ambulatory Visit | Attending: Internal Medicine | Admitting: Internal Medicine

## 2019-02-22 ENCOUNTER — Other Ambulatory Visit: Payer: Self-pay

## 2019-02-22 DIAGNOSIS — Y712 Prosthetic and other implants, materials and accessory cardiovascular devices associated with adverse incidents: Secondary | ICD-10-CM | POA: Diagnosis present

## 2019-02-22 DIAGNOSIS — Z1159 Encounter for screening for other viral diseases: Secondary | ICD-10-CM | POA: Diagnosis not present

## 2019-02-22 DIAGNOSIS — N186 End stage renal disease: Secondary | ICD-10-CM | POA: Diagnosis present

## 2019-02-22 DIAGNOSIS — Z79899 Other long term (current) drug therapy: Secondary | ICD-10-CM

## 2019-02-22 DIAGNOSIS — E1122 Type 2 diabetes mellitus with diabetic chronic kidney disease: Secondary | ICD-10-CM | POA: Diagnosis present

## 2019-02-22 DIAGNOSIS — T82818A Embolism of vascular prosthetic devices, implants and grafts, initial encounter: Principal | ICD-10-CM | POA: Diagnosis present

## 2019-02-22 DIAGNOSIS — Z7901 Long term (current) use of anticoagulants: Secondary | ICD-10-CM | POA: Diagnosis not present

## 2019-02-22 DIAGNOSIS — Z888 Allergy status to other drugs, medicaments and biological substances status: Secondary | ICD-10-CM

## 2019-02-22 DIAGNOSIS — Z833 Family history of diabetes mellitus: Secondary | ICD-10-CM | POA: Diagnosis not present

## 2019-02-22 DIAGNOSIS — I890 Lymphedema, not elsewhere classified: Secondary | ICD-10-CM | POA: Diagnosis present

## 2019-02-22 DIAGNOSIS — Z91048 Other nonmedicinal substance allergy status: Secondary | ICD-10-CM | POA: Diagnosis not present

## 2019-02-22 DIAGNOSIS — S40021A Contusion of right upper arm, initial encounter: Secondary | ICD-10-CM | POA: Diagnosis not present

## 2019-02-22 DIAGNOSIS — Z841 Family history of disorders of kidney and ureter: Secondary | ICD-10-CM

## 2019-02-22 DIAGNOSIS — E875 Hyperkalemia: Secondary | ICD-10-CM | POA: Diagnosis present

## 2019-02-22 DIAGNOSIS — R609 Edema, unspecified: Secondary | ICD-10-CM

## 2019-02-22 DIAGNOSIS — T829XXA Unspecified complication of cardiac and vascular prosthetic device, implant and graft, initial encounter: Secondary | ICD-10-CM | POA: Diagnosis not present

## 2019-02-22 DIAGNOSIS — Z992 Dependence on renal dialysis: Secondary | ICD-10-CM | POA: Diagnosis not present

## 2019-02-22 DIAGNOSIS — Z794 Long term (current) use of insulin: Secondary | ICD-10-CM | POA: Diagnosis not present

## 2019-02-22 DIAGNOSIS — I120 Hypertensive chronic kidney disease with stage 5 chronic kidney disease or end stage renal disease: Secondary | ICD-10-CM | POA: Diagnosis present

## 2019-02-22 DIAGNOSIS — Z87891 Personal history of nicotine dependence: Secondary | ICD-10-CM

## 2019-02-22 DIAGNOSIS — F329 Major depressive disorder, single episode, unspecified: Secondary | ICD-10-CM | POA: Diagnosis present

## 2019-02-22 DIAGNOSIS — M1A9XX0 Chronic gout, unspecified, without tophus (tophi): Secondary | ICD-10-CM | POA: Diagnosis present

## 2019-02-22 DIAGNOSIS — D631 Anemia in chronic kidney disease: Secondary | ICD-10-CM | POA: Diagnosis present

## 2019-02-22 DIAGNOSIS — F419 Anxiety disorder, unspecified: Secondary | ICD-10-CM | POA: Diagnosis present

## 2019-02-22 DIAGNOSIS — I770 Arteriovenous fistula, acquired: Secondary | ICD-10-CM | POA: Diagnosis present

## 2019-02-22 LAB — COMPREHENSIVE METABOLIC PANEL
ALT: 12 U/L (ref 0–44)
AST: 13 U/L — ABNORMAL LOW (ref 15–41)
Albumin: 3.8 g/dL (ref 3.5–5.0)
Alkaline Phosphatase: 69 U/L (ref 38–126)
Anion gap: 14 (ref 5–15)
BUN: 106 mg/dL — ABNORMAL HIGH (ref 6–20)
CO2: 23 mmol/L (ref 22–32)
Calcium: 8.2 mg/dL — ABNORMAL LOW (ref 8.9–10.3)
Chloride: 101 mmol/L (ref 98–111)
Creatinine, Ser: 15.2 mg/dL — ABNORMAL HIGH (ref 0.61–1.24)
GFR calc Af Amer: 4 mL/min — ABNORMAL LOW (ref >60)
GFR calc non Af Amer: 3 mL/min — ABNORMAL LOW (ref >60)
Glucose, Bld: 123 mg/dL — ABNORMAL HIGH (ref 70–99)
Potassium: 5.5 mmol/L — ABNORMAL HIGH (ref 3.5–5.1)
Sodium: 138 mmol/L (ref 135–145)
Total Bilirubin: 0.3 mg/dL (ref 0.3–1.2)
Total Protein: 7.4 g/dL (ref 6.5–8.1)

## 2019-02-22 LAB — RENAL FUNCTION PANEL
Albumin: 3.9 g/dL (ref 3.5–5.0)
Anion gap: 15 (ref 5–15)
BUN: 97 mg/dL — ABNORMAL HIGH (ref 6–20)
CO2: 23 mmol/L (ref 22–32)
Calcium: 8.2 mg/dL — ABNORMAL LOW (ref 8.9–10.3)
Chloride: 100 mmol/L (ref 98–111)
Creatinine, Ser: 15.6 mg/dL — ABNORMAL HIGH (ref 0.61–1.24)
GFR calc Af Amer: 4 mL/min — ABNORMAL LOW (ref >60)
GFR calc non Af Amer: 3 mL/min — ABNORMAL LOW (ref >60)
Glucose, Bld: 121 mg/dL — ABNORMAL HIGH (ref 70–99)
Phosphorus: 7.2 mg/dL — ABNORMAL HIGH (ref 2.5–4.6)
Potassium: 5.6 mmol/L — ABNORMAL HIGH (ref 3.5–5.1)
Sodium: 138 mmol/L (ref 135–145)

## 2019-02-22 LAB — CBC WITH DIFFERENTIAL/PLATELET
Basophils Absolute: 0 10*3/uL (ref 0.0–0.1)
Basophils Relative: 0 %
Eosinophils Absolute: 0.2 10*3/uL (ref 0.0–0.5)
Eosinophils Relative: 3 %
HCT: 33.4 % — ABNORMAL LOW (ref 39.0–52.0)
Hemoglobin: 10.8 g/dL — ABNORMAL LOW (ref 13.0–17.0)
Lymphocytes Relative: 16 %
Lymphs Abs: 1 10*3/uL (ref 0.7–4.0)
MCH: 30.5 pg (ref 26.0–34.0)
MCHC: 32.3 g/dL (ref 30.0–36.0)
MCV: 94.4 fL (ref 80.0–100.0)
Monocytes Absolute: 0.7 10*3/uL (ref 0.1–1.0)
Monocytes Relative: 12 %
Neutro Abs: 4.3 10*3/uL (ref 1.7–7.7)
Neutrophils Relative %: 70 %
Platelets: 132 10*3/uL — ABNORMAL LOW (ref 150–400)
RBC: 3.54 MIL/uL — ABNORMAL LOW (ref 4.22–5.81)
RDW: 13.8 % (ref 11.5–15.5)
WBC: 6.2 10*3/uL (ref 4.0–10.5)
nRBC: 0 % (ref 0.0–0.2)

## 2019-02-22 LAB — PROTIME-INR
INR: 1 (ref 0.8–1.2)
Prothrombin Time: 13.1 seconds (ref 11.4–15.2)

## 2019-02-22 LAB — GLUCOSE, CAPILLARY
Glucose-Capillary: 118 mg/dL — ABNORMAL HIGH (ref 70–99)
Glucose-Capillary: 150 mg/dL — ABNORMAL HIGH (ref 70–99)

## 2019-02-22 LAB — HIV ANTIBODY (ROUTINE TESTING W REFLEX): HIV Screen 4th Generation wRfx: NONREACTIVE

## 2019-02-22 MED ORDER — VITAMIN C 500 MG PO TABS
1000.0000 mg | ORAL_TABLET | Freq: Every day | ORAL | Status: DC
  Administered 2019-02-24 – 2019-02-25 (×2): 1000 mg via ORAL
  Filled 2019-02-22 (×5): qty 2

## 2019-02-22 MED ORDER — TORSEMIDE 100 MG PO TABS
100.0000 mg | ORAL_TABLET | Freq: Once | ORAL | Status: DC
  Filled 2019-02-22: qty 1

## 2019-02-22 MED ORDER — PATIROMER SORBITEX CALCIUM 8.4 G PO PACK
16.8000 g | PACK | Freq: Every day | ORAL | Status: AC
  Administered 2019-02-22 – 2019-02-23 (×2): 16.8 g via ORAL
  Filled 2019-02-22 (×2): qty 2

## 2019-02-22 MED ORDER — APIXABAN 5 MG PO TABS: 5.0000 mg | ORAL_TABLET | Freq: Two times a day (BID) | ORAL | Status: DC

## 2019-02-22 MED ORDER — INSULIN GLARGINE 100 UNIT/ML SOLOSTAR PEN: 10.0000 [IU] | PEN_INJECTOR | Freq: Two times a day (BID) | SUBCUTANEOUS | Status: DC

## 2019-02-22 MED ORDER — CHLORHEXIDINE GLUCONATE CLOTH 2 % EX PADS
6.0000 | MEDICATED_PAD | Freq: Every day | CUTANEOUS | Status: DC
  Administered 2019-02-23 – 2019-02-25 (×3): 6 via TOPICAL

## 2019-02-22 MED ORDER — SODIUM CHLORIDE 0.9% FLUSH: 3.0000 mL | INTRAVENOUS | Status: DC | PRN

## 2019-02-22 MED ORDER — MELATONIN 5 MG PO TABS
5.0000 mg | ORAL_TABLET | Freq: Every evening | ORAL | Status: DC | PRN
  Filled 2019-02-22: qty 1

## 2019-02-22 MED ORDER — ACETAMINOPHEN 325 MG PO TABS
650.0000 mg | ORAL_TABLET | Freq: Four times a day (QID) | ORAL | Status: DC | PRN
  Administered 2019-02-23 – 2019-02-24 (×3): 650 mg via ORAL
  Filled 2019-02-22 (×3): qty 2

## 2019-02-22 MED ORDER — INSULIN GLARGINE 100 UNIT/ML ~~LOC~~ SOLN
10.0000 [IU] | Freq: Two times a day (BID) | SUBCUTANEOUS | Status: DC
  Administered 2019-02-22 – 2019-02-25 (×6): 10 [IU] via SUBCUTANEOUS
  Filled 2019-02-22 (×8): qty 0.1

## 2019-02-22 MED ORDER — OMEGA-3 1000 MG PO CAPS: 1000.0000 mg | ORAL_CAPSULE | Freq: Every day | ORAL | Status: DC

## 2019-02-22 MED ORDER — ONDANSETRON HCL 4 MG/2ML IJ SOLN: 4.0000 mg | Freq: Four times a day (QID) | INTRAMUSCULAR | Status: DC | PRN

## 2019-02-22 MED ORDER — OMEGA-3-ACID ETHYL ESTERS 1 G PO CAPS
1.0000 g | ORAL_CAPSULE | Freq: Every day | ORAL | Status: DC
  Administered 2019-02-24 – 2019-02-25 (×2): 1 g via ORAL
  Filled 2019-02-22 (×3): qty 1

## 2019-02-22 MED ORDER — SODIUM CHLORIDE 0.9% FLUSH
3.0000 mL | Freq: Two times a day (BID) | INTRAVENOUS | Status: DC
  Administered 2019-02-23 – 2019-02-24 (×3): 3 mL via INTRAVENOUS

## 2019-02-22 MED ORDER — ALLOPURINOL 100 MG PO TABS
100.0000 mg | ORAL_TABLET | Freq: Every day | ORAL | Status: DC
  Administered 2019-02-24 – 2019-02-25 (×2): 100 mg via ORAL
  Filled 2019-02-22 (×3): qty 1

## 2019-02-22 MED ORDER — SODIUM CHLORIDE 0.9 % IV SOLN: 250.0000 mL | INTRAVENOUS | Status: DC | PRN

## 2019-02-22 MED ORDER — AMLODIPINE BESYLATE 10 MG PO TABS
10.0000 mg | ORAL_TABLET | Freq: Every day | ORAL | Status: DC
  Administered 2019-02-22 – 2019-02-24 (×2): 10 mg via ORAL
  Filled 2019-02-22 (×5): qty 1

## 2019-02-22 MED ORDER — ADULT MULTIVITAMIN W/MINERALS CH
1.0000 | ORAL_TABLET | Freq: Every day | ORAL | Status: DC
  Administered 2019-02-24 – 2019-02-25 (×2): 1 via ORAL
  Filled 2019-02-22 (×4): qty 1

## 2019-02-22 MED ORDER — ALBUTEROL SULFATE (2.5 MG/3ML) 0.083% IN NEBU
2.5000 mg | INHALATION_SOLUTION | Freq: Four times a day (QID) | RESPIRATORY_TRACT | Status: DC
  Administered 2019-02-22 – 2019-02-23 (×3): 2.5 mg via RESPIRATORY_TRACT
  Filled 2019-02-22 (×3): qty 3

## 2019-02-22 MED ORDER — CALCIUM ACETATE (PHOS BINDER) 667 MG PO CAPS
1334.0000 mg | ORAL_CAPSULE | Freq: Three times a day (TID) | ORAL | Status: DC
  Administered 2019-02-22 – 2019-02-25 (×4): 1334 mg via ORAL
  Filled 2019-02-22 (×4): qty 2

## 2019-02-22 MED ORDER — LIDOCAINE-PRILOCAINE 2.5-2.5 % EX CREA
1.0000 "application " | TOPICAL_CREAM | CUTANEOUS | Status: DC | PRN
  Administered 2019-02-23: 1 via TOPICAL
  Filled 2019-02-22: qty 5

## 2019-02-22 MED ORDER — METOLAZONE 5 MG PO TABS
5.0000 mg | ORAL_TABLET | ORAL | Status: DC
  Filled 2019-02-22: qty 1

## 2019-02-22 MED ORDER — ONDANSETRON HCL 4 MG PO TABS: 4.0000 mg | ORAL_TABLET | Freq: Four times a day (QID) | ORAL | Status: DC | PRN

## 2019-02-22 MED ORDER — ACETAMINOPHEN 650 MG RE SUPP: 650.0000 mg | Freq: Four times a day (QID) | RECTAL | Status: DC | PRN

## 2019-02-22 MED ORDER — HEPARIN SODIUM (PORCINE) 1000 UNIT/ML DIALYSIS
20.0000 [IU]/kg | INTRAMUSCULAR | Status: DC | PRN
  Administered 2019-02-24: 3100 [IU] via INTRAVENOUS_CENTRAL
  Filled 2019-02-22 (×2): qty 4

## 2019-02-22 MED ORDER — ATENOLOL 50 MG PO TABS
50.0000 mg | ORAL_TABLET | Freq: Every day | ORAL | Status: DC
  Filled 2019-02-22 (×4): qty 1

## 2019-02-22 NOTE — Progress Notes (Signed)
[facility], [place] 02/22/19  Subjective:   LOS: 0 No intake/output data recorded. Patient known to our practice from outpatient dialysis.  He underwent angioplasty and declot of his access yesterday.  However, this morning when he went to dialysis, his access was clotted.  He was brought in to verify as well as to plan further management of his clotted access. At present he is on room air.  Denies any shortness of breath.  Continues to have some leg edema.  Objective:  Vital signs in last 24 hours:  Temp:  [97.3 F (36.3 C)] 97.3 F (36.3 C) (06/24 1717) Pulse Rate:  [44-48] 45 (06/24 1717) Resp:  [9-20] 20 (06/24 1717) BP: (117-144)/(48-76) 132/62 (06/24 1717) SpO2:  [100 %] 100 % (06/24 1717)  Weight change:  There were no vitals filed for this visit.  Intake/Output:   No intake or output data in the 24 hours ending 02/22/19 1527   Physical Exam: General:  Sitting up in chair, no acute distress  HEENT  anicteric, moist oral mucous membranes  Neck  supple, no masses  Pulm/lungs  normal respiratory effort on room air, no crackles  CVS/Heart  regular rhythm, bradycardic  Abdomen:   Soft, nontender  Extremities:  Chronic lymphedema, also has 2-3+ pitting edema  Neurologic:  Alert, oriented  Skin:  No acute rashes  Access:  Right arm AV fistula, no bruit or thrill       Basic Metabolic Panel:  Recent Labs  Lab 02/20/19 1607 02/21/19 0242  NA  --  139  K 4.3 4.5  CL  --  100  CO2  --  26  GLUCOSE  --  149*  BUN  --  93*  CREATININE  --  13.07*  CALCIUM  --  8.3*     CBC: Recent Labs  Lab 02/21/19 0242  WBC 4.7  HGB 10.5*  HCT 31.8*  MCV 91.9  PLT 136*     No results found for: HEPBSAG, HEPBSAB, HEPBIGM    Microbiology:  Recent Results (from the past 240 hour(s))  SARS Coronavirus 2 (CEPHEID - Performed in [place] hospital lab), Hosp Order     Status: None   Collection Time: 02/20/19  8:50 AM   Specimen:  Nasopharyngeal Swab  Result Value Ref Range Status   SARS Coronavirus 2 NEGATIVE NEGATIVE Final    Comment: (NOTE) If result is NEGATIVE SARS-CoV-2 target nucleic acids are NOT DETECTED. The SARS-CoV-2 RNA is generally detectable in upper and lower  respiratory specimens during the acute phase of infection. The lowest  concentration of SARS-CoV-2 viral copies this assay can detect is 250  copies / mL. A negative result does not preclude SARS-CoV-2 infection  and should not be used as the sole basis for treatment or other  patient management decisions.  A negative result may occur with  improper specimen collection / handling, submission of specimen other  than nasopharyngeal swab, presence of viral mutation(s) within the  areas targeted by this assay, and inadequate number of viral copies  (<250 copies / mL). A negative result must be combined with clinical  observations, patient history, and epidemiological information. If result is POSITIVE SARS-CoV-2 target nucleic acids are DETECTED. The SARS-CoV-2 RNA is generally detectable in upper and lower  respiratory specimens dur ing the acute phase of infection.  Positive  results are indicative of active infection with SARS-CoV-2.  Clinical  correlation with patient history and other diagnostic information is  necessary to determine  patient infection status.  Positive results do  not rule out bacterial infection or co-infection with other viruses. If result is PRESUMPTIVE POSTIVE SARS-CoV-2 nucleic acids MAY BE PRESENT.   A presumptive positive result was obtained on the submitted specimen  and confirmed on repeat testing.  While 2019 novel coronavirus  (SARS-CoV-2) nucleic acids may be present in the submitted sample  additional confirmatory testing may be necessary for epidemiological  and / or clinical management purposes  to differentiate between  SARS-CoV-2 and other Sarbecovirus currently known to infect humans.  If clinically  indicated additional testing with an alternate test  methodology [phone]) is advised. The SARS-CoV-2 RNA is generally  detectable in upper and lower respiratory sp ecimens during the acute  phase of infection. The expected result is Negative. Fact Sheet for Patients:  StrictlyIdeas.no Fact Sheet for Healthcare Providers: BankingDealers.co.za This test is not yet approved or cleared by the [place] FDA and has been authorized for detection and/or diagnosis of SARS-CoV-2 by FDA under an Emergency Use Authorization (EUA).  This EUA will remain in effect (meaning this test can be used) for the duration of the COVID-19 declaration under Section 564(b)(1) of the Act, 21 U.S.C. section 360bbb-3(b)(1), unless the authorization is terminated or revoked sooner. Performed at [facility], [address] [place], [place] [address]   MRSA PCR Screening     Status: None   Collection Time: 02/20/19  8:14 PM   Specimen: Nasopharyngeal  Result Value Ref Range Status   MRSA by PCR NEGATIVE NEGATIVE Final    Comment:        The GeneXpert MRSA Assay (FDA approved for NASAL specimens only), is one component of a comprehensive MRSA colonization surveillance program. It is not intended to diagnose MRSA infection nor to guide or monitor treatment for MRSA infections. Performed at [facility], [address] [place], [place] [address]     Coagulation Studies: No results for input(s): LABPROT, INR in the last 72 hours.  Urinalysis: No results for input(s): COLORURINE, LABSPEC, PHURINE, GLUCOSEU, HGBUR, BILIRUBINUR, KETONESUR, PROTEINUR, UROBILINOGEN, NITRITE, LEUKOCYTESUR in the last 72 hours.  Invalid input(s): APPERANCEUR    Imaging: No results found.   Medications:    . [START ON 02/23/2019] Chlorhexidine Gluconate Cloth  6 each Topical Q0600   heparin  Assessment/ Plan:  48 y.o. male with end-stage renal  disease, diabetes, hypertension is admitted for management of hyperkalemia and access malfunction  Active Problems:   * No active hospital problems. *   #.  Complication of dialysis access, clotted AV fistula Case discussed with vascular surgery.  Patient will receive a tunneled dialysis catheter tomorrow.  We will check preoperative potassium tonight.  Treat medically if elevated.  Volume status is acceptable.  Dialysis planned for tomorrow after permcath.  #. Anemia of CKD  Lab Results  Component Value Date   HGB 10.5 (L) 02/21/2019  Will continue to monitor  #. SHPTH  No results found for: PTH Lab Results  Component Value Date   PHOS 4.8 (H) 07/14/2018   #. Diabetes type 2 with CKD Hgb A1c MFr Bld (%)  Date Value  10/01/2017 15.1 (H)   #Volume overload, with lower extremity edema Volume removal with HD as tolerated. 4500 cc removed on 6/23  #Hypertension with CKD Continue home antihypertensive regimen    LOS: 0 [name] 6/25/20203:27 PM  [place], [place]

## 2019-02-22 NOTE — H&P (Addendum)
[name] at [place] NAME: [name]    MR#:  [id number]  DATE OF BIRTH:  Aug 10, 1971  DATE OF ADMISSION:  02/22/2019  PRIMARY CARE PHYSICIAN: Dayton Martes Victoriano Lain, NP   REQUESTING/REFERRING PHYSICIAN:   CHIEF COMPLAINT:  No chief complaint on file.   HISTORY OF PRESENT ILLNESS: [name]  is a 48 y.o. male with a known history per below, discharged on yesterday for end-stage renal disease with hyperkalemia, returns today as a direct admission by request by nephrology/Dr. [name] for clotted AV fistula, patient now be admitted for acute clotted AV fistula.  PAST MEDICAL HISTORY:   Past Medical History:  Diagnosis Date  . Anxiety   . Chronic kidney disease    14% FUNCTION  . Depression   . Diabetes mellitus without complication ([place])   . Dyspnea    DOE  . Dysrhythmia   . History of orthopnea   . Hypertension   . Lymphedema of leg   . Microalbuminuria   . Sleep apnea    no CPAP  . Vitamin D deficiency     PAST SURGICAL HISTORY:  Past Surgical History:  Procedure Laterality Date  . A/V FISTULAGRAM Right 10/24/2018   Procedure: A/V FISTULAGRAM;  Surgeon: Katha Cabal, MD;  Location: [facility];  Service: Cardiovascular;  Laterality: Right;  . A/V FISTULAGRAM Right 11/24/2018   Procedure: A/V FISTULAGRAM;  Surgeon: Katha Cabal, MD;  Location: [facility] [facility];  Service: Cardiovascular;  Laterality: Right;  . A/V SHUNT INTERVENTION Right 02/21/2019   Procedure: A/V SHUNT INTERVENTION;  Surgeon: Katha Cabal, MD;  Location: [facility];  Service: Cardiovascular;  Laterality: Right;  . APPLICATION OF WOUND VAC Left 10/03/2017   Procedure: APPLICATION OF WOUND VAC;  Surgeon: Algernon Huxley, MD;  Location: [facility];  Service: General;  Laterality: Left;  . APPLICATION OF WOUND VAC Left 10/11/2017   Procedure: APPLICATION OF WOUND VAC;  Surgeon: Katha Cabal, MD;  Location: [facility];  Service:  Vascular;  Laterality: Left;  . APPLICATION OF WOUND VAC Left 10/14/2017   Procedure: WOUND VAC CHANGE;  Surgeon: Katha Cabal, MD;  Location: [facility];  Service: Vascular;  Laterality: Left;  left lower leg  . APPLICATION OF WOUND VAC Left 10/18/2017   Procedure: WOUND VAC CHANGE;  Surgeon: Katha Cabal, MD;  Location: [facility];  Service: Vascular;  Laterality: Left;  . APPLICATION OF WOUND VAC Left 10/07/2017   Procedure: APPLICATION OF WOUND VAC;  Surgeon: Katha Cabal, MD;  Location: [facility];  Service: Vascular;  Laterality: Left;  . AV FISTULA INSERTION W/ RF MAGNETIC GUIDANCE Right 07/19/2018   Procedure: AV FISTULA INSERTION W/RF MAGNETIC GUIDANCE;  Surgeon: Katha Cabal, MD;  Location: [facility];  Service: Cardiovascular;  Laterality: Right;  . CATARACT EXTRACTION W/PHACO Left 11/01/2018   Procedure: CATARACT EXTRACTION PHACO AND INTRAOCULAR LENS PLACEMENT (IOC)-LEFT, DIABETIC-INSULIN DEPENDENT;  Surgeon: Eulogio Bear, MD;  Location: [facility];  Service: Ophthalmology;  Laterality: Left;  [place] 00:41.8 CDE 4.61 Fluid Pack Lot # [id number]  . I&D EXTREMITY Left 10/11/2017   Procedure: IRRIGATION AND DEBRIDEMENT EXTREMITY;  Surgeon: Katha Cabal, MD;  Location: [facility];  Service: Vascular;  Laterality: Left;  . INCISION AND DRAINAGE ABSCESS Right 10/07/2017   Procedure: INCISION AND DRAINAGE ABSCESS;  Surgeon: Katha Cabal, MD;  Location: [facility];  Service: Vascular;  Laterality: Right;  . IRRIGATION AND  DEBRIDEMENT ABSCESS Left 10/03/2017   Procedure: IRRIGATION AND DEBRIDEMENT ABSCESS with debridement of skin, soft tissue, muscle 50sq cm;  Surgeon: Algernon Huxley, MD;  Location: [facility];  Service: General;  Laterality: Left;  . TEMPORARY DIALYSIS CATHETER  02/20/2019   Procedure: TEMPORARY DIALYSIS CATHETER;  Surgeon: Katha Cabal, MD;  Location: [facility];  Service: Cardiovascular;;    SOCIAL HISTORY:  Social History   Tobacco  Use  . Smoking status: Former Smoker    Types: Cigars  . Smokeless tobacco: Former Systems developer    Quit date: 09/30/2017  . Tobacco comment: occasional  Substance Use Topics  . Alcohol use: Yes    Alcohol/week: 1.0 standard drinks    Types: 1 Cans of beer per week    Comment: beer    FAMILY HISTORY:  Family History  Problem Relation Age of Onset  . Diabetes Father   . Kidney disease Father   . Diabetes Daughter     DRUG ALLERGIES:  Allergies  Allergen Reactions  . Lisinopril Swelling  . Adhesive [Tape] Itching  . Furosemide Other (See Comments)    Light headed, cough, blurry vision, weakness.    REVIEW OF SYSTEMS:   CONSTITUTIONAL: No fever, fatigue or weakness.  EYES: No blurred or double vision.  EARS, NOSE, AND THROAT: No tinnitus or ear pain.  RESPIRATORY: No cough, shortness of breath, wheezing or hemoptysis.  CARDIOVASCULAR: No chest pain, orthopnea, edema.  GASTROINTESTINAL: No nausea, vomiting, diarrhea or abdominal pain.  GENITOURINARY: No dysuria, hematuria.  ENDOCRINE: No polyuria, nocturia,  HEMATOLOGY: No anemia, easy bruising or bleeding SKIN: No rash or lesion. MUSCULOSKELETAL: No joint pain or arthritis.   NEUROLOGIC: No tingling, numbness, weakness.  PSYCHIATRY: No anxiety or depression.   MEDICATIONS AT HOME:  Prior to Admission medications   Medication Sig Start Date End Date Taking? Authorizing Provider  allopurinol (ZYLOPRIM) 100 MG tablet Take 100 mg by mouth daily. 05/11/18  Yes [provider]  amLODipine (NORVASC) 10 MG tablet Take 10 mg by mouth daily.  11/24/17  Yes [provider]  apixaban (ELIQUIS) 5 MG TABS tablet Take 5 mg by mouth 2 (two) times daily.   Yes [provider]  Ascorbic Acid (VITAMIN C WITH ROSE HIPS) 1000 MG tablet Take 1,000 mg by mouth daily.   Yes [provider]  atenolol (TENORMIN) 50 MG tablet Take 1 tablet (50 mg total) by mouth daily. 02/22/19  Yes Mayo, Pete Pelt, MD  calcium acetate  (PHOSLO) 667 MG capsule Take 1,334 mg by mouth 3 (three) times daily with meals. 01/23/19  Yes [provider]  Cinnamon 500 MG capsule Take 500 mg by mouth daily.   Yes [provider]  Insulin Glargine (LANTUS SOLOSTAR) 100 UNIT/ML Solostar Pen Inject 10-20 Units into the skin 2 (two) times daily.    Yes [provider]  lidocaine-prilocaine (EMLA) cream Apply 1 application topically as needed (for fistula).   Yes [provider]  Melatonin 5 MG CAPS Take 5 mg by mouth at bedtime as needed (sleep).   Yes [provider]  metolazone (ZAROXOLYN) 5 MG tablet Take 5 mg by mouth every Monday, Wednesday, and Friday.    Yes [provider]  Multiple Vitamin (MULTIVITAMIN WITH MINERALS) TABS tablet Take 1 tablet by mouth daily.    Yes [provider]  Multiple Vitamins-Minerals (EYE HEALTH PO) Take 1 capsule by mouth daily.    Yes [provider]  Omega-3 1000 MG CAPS Take  1,000 mg by mouth daily.   Yes [provider]  OVER THE COUNTER MEDICATION Take 1 tablet by mouth daily. Alpha Edison Pace Supreme testosterone booster   Yes [provider]  OVER THE COUNTER MEDICATION Take 1 capsule by mouth daily. Nugenix testosterone booster   Yes [provider]  torsemide (DEMADEX) 20 MG tablet Take 100 mg by mouth once.  01/03/18  Yes [provider]      PHYSICAL EXAMINATION:   VITAL SIGNS: Blood pressure 140/66, pulse (!) 51, temperature 97.9 F (36.6 C), temperature source Oral, resp. rate 17, SpO2 100 %.  GENERAL:  48 y.o.-year-old patient lying in the bed with no acute distress.  EYES: Pupils equal, round, reactive to light and accommodation. No scleral icterus. Extraocular muscles intact.  HEENT: Head atraumatic, normocephalic. Oropharynx and nasopharynx clear.  NECK:  Supple, no jugular venous distention. No thyroid enlargement, no tenderness.  LUNGS: Normal breath sounds bilaterally, no wheezing,  rales,rhonchi or crepitation. No use of accessory muscles of respiration.  CARDIOVASCULAR: S1, S2 normal. No murmurs, rubs, or gallops.  ABDOMEN: Soft, nontender, nondistended. Bowel sounds present. No organomegaly or mass.  EXTREMITIES: No pedal edema, cyanosis, or clubbing.  NEUROLOGIC: Cranial nerves II through XII are intact. Muscle strength 5/5 in all extremities. Sensation intact. Gait not checked.  PSYCHIATRIC: The patient is alert and oriented x 3.  SKIN: No obvious rash, lesion, or ulcer.   LABORATORY PANEL:   CBC Recent Labs  Lab 02/21/19 0242  WBC 4.7  HGB 10.5*  HCT 31.8*  PLT 136*  MCV 91.9  MCH 30.3  MCHC 33.0  RDW 13.7   ------------------------------------------------------------------------------------------------------------------  Chemistries  Recent Labs  Lab 02/20/19 1607 02/21/19 0242  NA  --  139  K 4.3 4.5  CL  --  100  CO2  --  26  GLUCOSE  --  149*  BUN  --  93*  CREATININE  --  13.07*  CALCIUM  --  8.3*   ------------------------------------------------------------------------------------------------------------------ estimated creatinine clearance is 11.1 mL/min (A) (by C-G formula based on SCr of 13.07 mg/dL (H)). ------------------------------------------------------------------------------------------------------------------ No results for input(s): TSH, T4TOTAL, T3FREE, THYROIDAB in the last 72 hours.  Invalid input(s): FREET3   Coagulation profile No results for input(s): INR, PROTIME in the last 168 hours. ------------------------------------------------------------------------------------------------------------------- No results for input(s): DDIMER in the last 72 hours. -------------------------------------------------------------------------------------------------------------------  Cardiac Enzymes No results for input(s): CKMB, TROPONINI, MYOGLOBIN in the last 168 hours.  Invalid input(s):  CK ------------------------------------------------------------------------------------------------------------------ Invalid input(s): POCBNP  ---------------------------------------------------------------------------------------------------------------  Urinalysis    Component Value Date/Time   COLORURINE YELLOW (A) 10/03/2017 1120   APPEARANCEUR CLOUDY (A) 10/03/2017 1120   LABSPEC 1.015 10/03/2017 1120   PHURINE 5.0 10/03/2017 1120   GLUCOSEU NEGATIVE 10/03/2017 1120   HGBUR SMALL (A) 10/03/2017 1120   BILIRUBINUR NEGATIVE 10/03/2017 1120   KETONESUR NEGATIVE 10/03/2017 1120   PROTEINUR 30 (A) 10/03/2017 1120   NITRITE NEGATIVE 10/03/2017 1120   LEUKOCYTESUR NEGATIVE 10/03/2017 1120     RADIOLOGY: No results found.  EKG: Orders placed or performed during the hospital encounter of 07/14/18  . EKG test  . EKG test    IMPRESSION AND PLAN: *Acute clotted AV fistula Consult vascular surgery/Dr. Dew-for permacath basement 1 tomorrow, n.p.o. after midnight, hold Eliquis  *Chronic ESRD on HD MWF Nephrology consulted for hemodialysis needs Continue home torsemide and metolazone  *Oral anticoagulation  Hold Eliquis for now given plans for possible procedure on tomorrow   *Chronic gout without exacerbation  Continue home allopurinol  *  Chronic benign essential hypertension  Stable  Continue home Norvasc, atenolol, clonidine  *Chronic diabetes mellitus type 2 type 2 diabetes Plan scale insulin with Accu-Cheks per routine  DVT prophylaxis-SCDs  All the records are reviewed and case discussed with ED provider. Management plans discussed with the patient, family and they are in agreement.  CODE STATUS:full Code Status History    Date Active Date Inactive Code Status Order ID Comments User Context   02/20/2019 1413 02/22/2019 0114 Full Code [id number]  Sela Hua, MD Inpatient   10/01/2017 1321 10/19/2017 2017 Full Code [id number]  [name], MD Inpatient    Advance Care Planning Activity       TOTAL TIME TAKING CARE OF THIS PATIENT: 40 minutes.    Avel Peace  M.D on 02/22/2019   Between 7am to 6pm - Pager - [phone]  After 6pm go to [email or web address] - password [email or web address] [facility]  Office  [phone]  CC: Primary care physician; Sallee Lange, NP   Note: This dictation was prepared with Dragon dictation along with smaller phrase technology. Any transcriptional errors that result from this process are unintentional.

## 2019-02-22 NOTE — Progress Notes (Signed)
Verified with Dr. Jerelyn Charles if patient needs IV because IV team stated that at this time it was not in the patient's best interest due to no IV medication ordered. Dr. Jerelyn Charles stated that is was okay for patient to have no IV access at this time. [name]

## 2019-02-22 NOTE — Progress Notes (Signed)
Family Meeting Note  Advance Directive:yes  Today a meeting took place with the Patient.  Patient is able to participate   The following clinical team members were present during this meeting:MD  The following were discussed:Patient's diagnosis: 48 y.o. male with a known history per below, discharged on yesterday for end-stage renal disease with hyperkalemia, returns today as a direct admission by request by nephrology/Dr. [name] for clotted AV fistula, patient now be admitted for acute clotted AV fistula, Patient's progosis: Unable to determine and Goals for treatment: Full Code  Additional follow-up to be provided: prn  Time spent during discussion:20 minutes  Gorden Harms, MD

## 2019-02-22 NOTE — Progress Notes (Addendum)
VAST consulted to place PIV in pt with restriction of right arm d/t fistula. According to physician note, pt was direct admitted for acute clotted AV fistula with possible surgery tomorrow. Pt currently has no IVF's or meds ordered. Called unit and spoke with pt's nurse regarding vein preservation as well as decreasing infection risk. Pt's nurse stated she was under the impression all admitted patients should have IV access. VAST RN educated that it is not in the best interest of the patient to be stuck for IV access that  may not work later when needed, especially when there are already  limited areas for IV's to be placed. Pt's nurse concerned for access during a code situation. VAST RN educated that if code is called, numerous team members are present which can obtain some type of access. Further ensured pt's nurse if she is adamant that an IV be placed at this time, she can have the physician write an order for IV access, but it is not in the best interest of the patient.

## 2019-02-23 ENCOUNTER — Other Ambulatory Visit (INDEPENDENT_AMBULATORY_CARE_PROVIDER_SITE_OTHER): Payer: Self-pay | Admitting: Vascular Surgery

## 2019-02-23 ENCOUNTER — Encounter: Payer: Self-pay | Admitting: Vascular Surgery

## 2019-02-23 ENCOUNTER — Inpatient Hospital Stay
Admission: AD | Disposition: A | Discharge status: Home / Self Care | Payer: Self-pay | Source: Ambulatory Visit | Attending: Internal Medicine

## 2019-02-23 DIAGNOSIS — N186 End stage renal disease: Secondary | ICD-10-CM

## 2019-02-23 DIAGNOSIS — T829XXA Unspecified complication of cardiac and vascular prosthetic device, implant and graft, initial encounter: Secondary | ICD-10-CM

## 2019-02-23 DIAGNOSIS — Z992 Dependence on renal dialysis: Secondary | ICD-10-CM

## 2019-02-23 HISTORY — PX: DIALYSIS/PERMA CATHETER INSERTION: CATH118288

## 2019-02-23 LAB — GLUCOSE, CAPILLARY
Glucose-Capillary: 110 mg/dL — ABNORMAL HIGH (ref 70–99)
Glucose-Capillary: 98 mg/dL (ref 70–99)
Glucose-Capillary: 82 mg/dL (ref 70–99)
Glucose-Capillary: 120 mg/dL — ABNORMAL HIGH (ref 70–99)

## 2019-02-23 LAB — BASIC METABOLIC PANEL
Anion gap: 15 (ref 5–15)
BUN: 115 mg/dL — ABNORMAL HIGH (ref 6–20)
CO2: 22 mmol/L (ref 22–32)
Calcium: 8.1 mg/dL — ABNORMAL LOW (ref 8.9–10.3)
Chloride: 102 mmol/L (ref 98–111)
Creatinine, Ser: 16.09 mg/dL — ABNORMAL HIGH (ref 0.61–1.24)
GFR calc Af Amer: 4 mL/min — ABNORMAL LOW (ref >60)
GFR calc non Af Amer: 3 mL/min — ABNORMAL LOW (ref >60)
Glucose, Bld: 112 mg/dL — ABNORMAL HIGH (ref 70–99)
Potassium: 5.3 mmol/L — ABNORMAL HIGH (ref 3.5–5.1)
Sodium: 139 mmol/L (ref 135–145)

## 2019-02-23 SURGERY — DIALYSIS/PERMA CATHETER INSERTION
Anesthesia: Choice

## 2019-02-23 MED ORDER — MIDAZOLAM HCL 2 MG/ML PO SYRP
8.0000 mg | ORAL_SOLUTION | Freq: Once | ORAL | Status: DC | PRN
  Filled 2019-02-23: qty 4

## 2019-02-23 MED ORDER — MIDAZOLAM HCL 5 MG/5ML IJ SOLN
INTRAMUSCULAR | Status: AC
  Filled 2019-02-23: qty 5

## 2019-02-23 MED ORDER — DIPHENHYDRAMINE HCL 50 MG/ML IJ SOLN: 50.0000 mg | Freq: Once | INTRAMUSCULAR | Status: DC | PRN

## 2019-02-23 MED ORDER — FENTANYL CITRATE (PF) 100 MCG/2ML IJ SOLN
INTRAMUSCULAR | Status: DC | PRN
  Administered 2019-02-23: 50 ug via INTRAVENOUS
  Administered 2019-02-23: 25 ug via INTRAVENOUS

## 2019-02-23 MED ORDER — METHYLPREDNISOLONE SODIUM SUCC 125 MG IJ SOLR: 125.0000 mg | Freq: Once | INTRAMUSCULAR | Status: DC | PRN

## 2019-02-23 MED ORDER — ONDANSETRON HCL 4 MG/2ML IJ SOLN: 4.0000 mg | Freq: Four times a day (QID) | INTRAMUSCULAR | Status: DC | PRN

## 2019-02-23 MED ORDER — HYDRALAZINE HCL 20 MG/ML IJ SOLN
INTRAMUSCULAR | Status: AC
  Filled 2019-02-23: qty 1

## 2019-02-23 MED ORDER — FAMOTIDINE 20 MG PO TABS: 40.0000 mg | ORAL_TABLET | Freq: Once | ORAL | Status: DC | PRN

## 2019-02-23 MED ORDER — CEFAZOLIN SODIUM-DEXTROSE 1-4 GM/50ML-% IV SOLN
1.0000 g | Freq: Once | INTRAVENOUS | Status: AC
  Administered 2019-02-23: 1 g via INTRAVENOUS
  Filled 2019-02-23: qty 50

## 2019-02-23 MED ORDER — SODIUM CHLORIDE 0.9 % IV SOLN
INTRAVENOUS | Status: DC
  Administered 2019-02-23: 13:00:00 via INTRAVENOUS

## 2019-02-23 MED ORDER — FENTANYL CITRATE (PF) 100 MCG/2ML IJ SOLN
INTRAMUSCULAR | Status: AC
  Filled 2019-02-23: qty 2

## 2019-02-23 MED ORDER — ALBUTEROL SULFATE (2.5 MG/3ML) 0.083% IN NEBU: 2.5000 mg | INHALATION_SOLUTION | Freq: Four times a day (QID) | RESPIRATORY_TRACT | Status: DC | PRN

## 2019-02-23 MED ORDER — HYDRALAZINE HCL 20 MG/ML IJ SOLN
10.0000 mg | Freq: Once | INTRAMUSCULAR | Status: AC
  Administered 2019-02-23: 15:00:00 10 mg via INTRAVENOUS

## 2019-02-23 MED ORDER — HYDROMORPHONE HCL 1 MG/ML IJ SOLN
1.0000 mg | Freq: Once | INTRAMUSCULAR | Status: AC | PRN
  Administered 2019-02-23: 1 mg via INTRAVENOUS
  Filled 2019-02-23: qty 1

## 2019-02-23 MED ORDER — MIDAZOLAM HCL 2 MG/2ML IJ SOLN
INTRAMUSCULAR | Status: DC | PRN
  Administered 2019-02-23: 2 mg via INTRAVENOUS
  Administered 2019-02-23: 1 mg via INTRAVENOUS

## 2019-02-23 SURGICAL SUPPLY — 6 items
CATH CANNON HEMO 15FR 19 (HEMODIALYSIS SUPPLIES) ×2 IMPLANT
DERMABOND ADVANCED (GAUZE/BANDAGES/DRESSINGS) ×2
DERMABOND ADVANCED .7 DNX12 (GAUZE/BANDAGES/DRESSINGS) IMPLANT
PACK ANGIOGRAPHY (CUSTOM PROCEDURE TRAY) ×2 IMPLANT
SUT MNCRL AB 4-0 PS2 18 (SUTURE) ×2 IMPLANT
SUT PROLENE 0 CT 1 30 (SUTURE) ×2 IMPLANT

## 2019-02-23 NOTE — Progress Notes (Signed)
Patient ID: [name], male   DOB: 03/21/1971, 48 y.o.   MRN: [id number]  [facility] PROGRESS NOTE  [name] SWF:[id number] DOB: 1970-11-02 DOA: 02/22/2019 PCP: Sallee Lange, NP  HPI/Subjective: Patient feels okay.  States he is swollen because he has not had a good dialysis in a while.  They were unable to dialyze him even after the fistulogram.  PermCath to be placed today.  Objective: Vitals:   02/23/19 0525 02/23/19 1018  BP: (!) 125/54 133/63  Pulse: 61 (!) 56  Resp: 20   Temp: 99.1 F (37.3 C)   SpO2: 96% 100%    Intake/Output Summary (Last 24 hours) at 02/23/2019 1220 Last data filed at 02/23/2019 1000 Gross per 24 hour  Intake 150 ml  Output -  Net 150 ml   Filed Weights   02/22/19 1546 02/23/19 0748  Weight: (!) 156.4 kg (!) 174.5 kg    ROS: Review of Systems  Constitutional: Positive for malaise/fatigue. Negative for chills and fever.  Eyes: Negative for blurred vision.  Respiratory: Negative for cough and shortness of breath.   Cardiovascular: Negative for chest pain.  Gastrointestinal: Negative for abdominal pain, constipation, diarrhea, nausea and vomiting.  Genitourinary: Negative for dysuria.  Musculoskeletal: Negative for joint pain.  Neurological: Negative for dizziness and headaches.   Exam: Physical Exam  Constitutional: He is oriented to person, place, and time.  HENT:  Nose: No mucosal edema.  Mouth/Throat: No oropharyngeal exudate or posterior oropharyngeal edema.  Eyes: Pupils are equal, round, and reactive to light. Conjunctivae, EOM and lids are normal.  Neck: No JVD present. Carotid bruit is not present. No edema present. No thyroid mass and no thyromegaly present.  Cardiovascular: S1 normal and S2 normal. Exam reveals no gallop.  No murmur heard. Pulses:      Dorsalis pedis pulses are 2+ on the right side and 2+ on the left side.  Respiratory: No respiratory distress. He has decreased breath sounds in the right lower  field and the left lower field. He has no wheezes. He has no rhonchi. He has no rales.  GI: Soft. Bowel sounds are normal. There is no abdominal tenderness.  Musculoskeletal:     Right ankle: He exhibits swelling.     Left ankle: He exhibits swelling.  Lymphadenopathy:    He has no cervical adenopathy.  Neurological: He is alert and oriented to person, place, and time. No cranial nerve deficit.  Skin: Skin is warm. No rash noted. Nails show no clubbing.  Psychiatric: His affect is blunt.      Data Reviewed: Basic Metabolic Panel: Recent Labs  Lab 02/20/19 1607 02/21/19 0242 02/22/19 1636 02/23/19 0432  NA  --  139 138  138 139  K 4.3 4.5 5.6*  5.5* 5.3*  CL  --  100 100  101 102  CO2  --  26 23  23 22   GLUCOSE  --  149* 121*  123* 112*  BUN  --  93* 97*  106* 115*  CREATININE  --  13.07* 15.60*  15.20* 16.09*  CALCIUM  --  8.3* 8.2*  8.2* 8.1*  PHOS  --   --  7.2*  --    Liver Function Tests: Recent Labs  Lab 02/22/19 1636  AST 13*  ALT 12  ALKPHOS 69  BILITOT 0.3  PROT 7.4  ALBUMIN 3.9  3.8   CBC: Recent Labs  Lab 02/21/19 0242 02/22/19 1636  WBC 4.7 6.2  NEUTROABS  --  4.3  HGB 10.5* 10.8*  HCT 31.8* 33.4*  MCV 91.9 94.4  PLT 136* 132*    CBG: Recent Labs  Lab 02/21/19 1758 02/22/19 1612 02/22/19 2148 02/23/19 0740 02/23/19 1145  GLUCAP 143* 118* 150* 110* 98    Recent Results (from the past 240 hour(s))  SARS Coronavirus 2 (CEPHEID - Performed in [place] hospital lab), Hosp Order     Status: None   Collection Time: 02/20/19  8:50 AM   Specimen: Nasopharyngeal Swab  Result Value Ref Range Status   SARS Coronavirus 2 NEGATIVE NEGATIVE Final    Comment: (NOTE) If result is NEGATIVE SARS-CoV-2 target nucleic acids are NOT DETECTED. The SARS-CoV-2 RNA is generally detectable in upper and lower  respiratory specimens during the acute phase of infection. The lowest  concentration of SARS-CoV-2 viral copies this assay can detect is  250  copies / mL. A negative result does not preclude SARS-CoV-2 infection  and should not be used as the sole basis for treatment or other  patient management decisions.  A negative result may occur with  improper specimen collection / handling, submission of specimen other  than nasopharyngeal swab, presence of viral mutation(s) within the  areas targeted by this assay, and inadequate number of viral copies  (<250 copies / mL). A negative result must be combined with clinical  observations, patient history, and epidemiological information. If result is POSITIVE SARS-CoV-2 target nucleic acids are DETECTED. The SARS-CoV-2 RNA is generally detectable in upper and lower  respiratory specimens dur ing the acute phase of infection.  Positive  results are indicative of active infection with SARS-CoV-2.  Clinical  correlation with patient history and other diagnostic information is  necessary to determine patient infection status.  Positive results do  not rule out bacterial infection or co-infection with other viruses. If result is PRESUMPTIVE POSTIVE SARS-CoV-2 nucleic acids MAY BE PRESENT.   A presumptive positive result was obtained on the submitted specimen  and confirmed on repeat testing.  While 2019 novel coronavirus  (SARS-CoV-2) nucleic acids may be present in the submitted sample  additional confirmatory testing may be necessary for epidemiological  and / or clinical management purposes  to differentiate between  SARS-CoV-2 and other Sarbecovirus currently known to infect humans.  If clinically indicated additional testing with an alternate test  methodology [phone]) is advised. The SARS-CoV-2 RNA is generally  detectable in upper and lower respiratory sp ecimens during the acute  phase of infection. The expected result is Negative. Fact Sheet for Patients:  StrictlyIdeas.no Fact Sheet for Healthcare  Providers: BankingDealers.co.za This test is not yet approved or cleared by the [place] FDA and has been authorized for detection and/or diagnosis of SARS-CoV-2 by FDA under an Emergency Use Authorization (EUA).  This EUA will remain in effect (meaning this test can be used) for the duration of the COVID-19 declaration under Section 564(b)(1) of the Act, 21 U.S.C. section 360bbb-3(b)(1), unless the authorization is terminated or revoked sooner. Performed at [facility], [address] [place], [place] [address]   MRSA PCR Screening     Status: None   Collection Time: 02/20/19  8:14 PM   Specimen: Nasopharyngeal  Result Value Ref Range Status   MRSA by PCR NEGATIVE NEGATIVE Final    Comment:        The GeneXpert MRSA Assay (FDA approved for NASAL specimens only), is one component of a comprehensive MRSA colonization surveillance program. It is not intended to diagnose MRSA infection nor  to guide or monitor treatment for MRSA infections. Performed at [facility], [address] [place], [place] [address]      Scheduled Meds: . allopurinol  100 mg Oral Daily  . amLODipine  10 mg Oral Daily  . atenolol  50 mg Oral Daily  . calcium acetate  1,334 mg Oral TID WC  . Chlorhexidine Gluconate Cloth  6 each Topical Q0600  . insulin glargine  10 Units Subcutaneous BID  . metolazone  5 mg Oral Q M,W,F  . multivitamin with minerals  1 tablet Oral Daily  . omega-3 acid ethyl esters  1 g Oral Daily  . patiromer  16.8 g Oral Daily  . sodium chloride flush  3 mL Intravenous Q12H  . torsemide  100 mg Oral Once  . vitamin C with rose hips  1,000 mg Oral Daily   Continuous Infusions: . sodium chloride    . sodium chloride    .  ceFAZolin (ANCEF) IV      Assessment/Plan:  1. Complication with AV fistula and unable to do dialysis.  Patient will require PermCath and dialysis today.  Patient will have dialysis again  tomorrow. 2. End-stage renal disease on hemodialysis Monday Wednesday Friday 3. Lower extremity edema and lymphedema.  Dialysis to help manage fluid. 4. Hypertension continue amlodipine, atenolol torsemide and metolazone. 5. History of gout on low-dose allopurinol 6. Hyperkalemia.  Given patiromer.  Once catheter in dialysis will manage. 7. Type 2 diabetes.  So far sugars have been on the lower side.  Check a hemoglobin A1c.  Code Status:     Code Status Orders  (From admission, onward)         Start     Ordered   02/22/19 1624  Full code  Continuous     02/22/19 1623        Code Status History    Date Active Date Inactive Code Status Order ID Comments User Context   02/20/2019 1413 02/22/2019 0114 Full Code [id number]  Sela Hua, MD Inpatient   10/01/2017 1321 10/19/2017 2017 Full Code [id number]  [name], MD Inpatient   Advance Care Planning Activity    Advance Directive Documentation     Most Recent Value  Type of Advance Directive  Healthcare Power of Attorney  Pre-existing out of facility DNR order (yellow form or pink MOST form)  -  "MOST" Form in Place?  -      Disposition Plan: Potential discharge home tomorrow after dialysis  Consultants:  Vascular surgery  Nephrology  Time spent: 28 minutes  [place]

## 2019-02-23 NOTE — Progress Notes (Signed)
Patient clinically stable post perm cath placement per Dr Lucky Cowboy. Tolerated well, vitals stable. Denies complaints. Report given to care nurse 221, with questions answered.

## 2019-02-23 NOTE — Progress Notes (Signed)
Post HD Assessment    02/23/19 1924  Neurological  Level of Consciousness Alert  Orientation Level Oriented X4  Respiratory  Respiratory Pattern Regular;Unlabored  Chest Assessment Chest expansion symmetrical  Bilateral Breath Sounds Clear  Cough None  Cardiac  Pulse Regular  Heart Sounds S1, S2  ECG Monitor Yes  Cardiac Rhythm SB  Vascular  R Radial Pulse +2  L Radial Pulse +2  Edema Generalized  Generalized Edema +1  Psychosocial  Psychosocial (WDL) WDL  Patient Behaviors Cooperative;Calm  Emotional support given Given to patient

## 2019-02-23 NOTE — Progress Notes (Signed)
Pre HD Assessment    02/23/19 1545  Neurological  Level of Consciousness Alert  Orientation Level Oriented X4  Respiratory  Respiratory Pattern Regular;Unlabored  Chest Assessment Chest expansion symmetrical  Bilateral Breath Sounds Clear;Diminished  Cough None  Cardiac  Pulse Regular  Heart Sounds S1, S2  ECG Monitor Yes  Cardiac Rhythm SB  Vascular  R Radial Pulse +2  L Radial Pulse +2  Edema Generalized  Generalized Edema +1  Psychosocial  Psychosocial (WDL) WDL  Patient Behaviors Cooperative;Calm  Emotional support given Given to patient

## 2019-02-23 NOTE — Progress Notes (Signed)
[facility] Daily Progress Note   Subjective: 02/21/19: 1. Contrast injection right cephalic fistula 2. Mechanical thrombectomy with Trerotola device right arm fistula 3. Percutaneous transluminal angioplasty and stent placement peripheral segment right arm AV fistula  Patient readmitted last night for non-functioning dialysis access. POD#2 access thrombectomy. With some right upper extremity tenderness.  Vascular Surgery was consulted for permcath insertion.   Objective: Vitals:   02/22/19 1950 02/23/19 0118 02/23/19 0525 02/23/19 0748  BP: (!) 145/62  (!) 125/54   Pulse: (!) 52  61   Resp:   20   Temp: 98 F (36.7 C)  99.1 F (37.3 C)   TempSrc: Oral  Oral   SpO2: 97% 100% 96%   Weight:    (!) 174.5 kg  Height:        Intake/Output Summary (Last 24 hours) at 02/23/2019 1003 Last data filed at 02/22/2019 1837 Gross per 24 hour  Intake 120 ml  Output -  Net 120 ml   Physical Exam: A&Ox3, NAD CV: RRR Pulmonary: CTA Bilaterally Abdomen: Soft, Nontender, Nondistended Vascular:  Right Upper Extremity: Warm distally to fingers. No bruit or thrill in access. Moderate upper extremity edema noted. Motor / sensory intact.    Laboratory: CBC    Component Value Date/Time   WBC 6.2 02/22/2019 1636   HGB 10.8 (L) 02/22/2019 1636   HCT 33.4 (L) 02/22/2019 1636   PLT 132 (L) 02/22/2019 1636   BMET    Component Value Date/Time   NA 139 02/23/2019 0432   K 5.3 (H) 02/23/2019 0432   CL 102 02/23/2019 0432   CO2 22 02/23/2019 0432   GLUCOSE 112 (H) 02/23/2019 0432   BUN 115 (H) 02/23/2019 0432   CREATININE 16.09 (H) 02/23/2019 0432   CALCIUM 8.1 (L) 02/23/2019 0432   GFRNONAA 3 (L) 02/23/2019 0432   GFRAA 4 (L) 02/23/2019 0432   Assessment/Planning: The patient is a 48 year old male with ESRD s/p POD#2 dialysis access thrombectomy 1) Will place permcath to allow for immediate / outpatient dialysis.  2) May need to discuss possibility of creation of  new access 3) Procedure, risks and benefits explained to the patient.  All questions answered.  The patient wishes to proceed.  Discussed with Dew / Tamera Stands PA-C 02/23/2019 10:03 AM

## 2019-02-23 NOTE — TOC Initial Note (Signed)
Transition of Care [facility]) - Initial/Assessment Note    Patient Details  Name: [name] MRN: [id number] Date of Birth: 1970/12/09  Transition of Care [facility] CM/SW Contact:    Beverly Sessions, RN Phone Number: 02/23/2019, 2:28 PM  Clinical Narrative:                  Elvera Bicker dialysis liaison notified of admission.          Patient Goals and CMS Choice        Expected Discharge Plan and Services                                                Prior Living Arrangements/Services                       Activities of Daily Living Home Assistive Devices/Equipment: None ADL Screening (condition at time of admission) Patient's cognitive ability adequate to safely complete daily activities?: Yes Is the patient deaf or have difficulty hearing?: No Does the patient have difficulty seeing, even when wearing glasses/contacts?: No Does the patient have difficulty concentrating, remembering, or making decisions?: No Patient able to express need for assistance with ADLs?: Yes Does the patient have difficulty dressing or bathing?: No Independently performs ADLs?: Yes (appropriate for developmental age) Does the patient have difficulty walking or climbing stairs?: No Weakness of Legs: None Weakness of Arms/Hands: None  Permission Sought/Granted                  Emotional Assessment              Admission diagnosis:  Complication of Dialysis Access Patient Active Problem List   Diagnosis Date Noted  . AVF (arteriovenous fistula) ([place]) 02/22/2019  . Hyperkalemia 02/20/2019  . End stage renal disease ([place]) 05/29/2018  . Venous ulcer of ankle, left ([place]) 05/29/2018  . Lymphedema 11/03/2017  . Venous ulcer ([place]) 10/26/2017  . Cellulitis 10/01/2017  . Chronic venous insufficiency 10/01/2017  . Hypertension 09/20/2017  . Diabetes mellitus without complication ([place]) 93/23/5573  . Swelling of limb 09/20/2017   PCP:  Sallee Lange,  NP Pharmacy:   [id number] - [place], [place] [address] Phone: [phone] Fax: [phone]     Social Determinants of Health (SDOH) Interventions    Readmission Risk Interventions No flowsheet data found.

## 2019-02-23 NOTE — Progress Notes (Signed)
HD TX completed tolerated well, uf goal not met, MD made aware, pt had been NPO for 12+ hours.    02/23/19 1919  Hand-Off documentation  Report given to (Full Name) Ruthy Dick, RN  Report received from (Full Name) Beatris Ship, RN   Vital Signs  Temp 98.1 F (36.7 C)  Temp Source Oral  Pulse Rate (!) 55  Pulse Rate Source Monitor  Resp 18  BP 140/68  BP Location Left Arm  BP Method Automatic  Patient Position (if appropriate) Lying  Oxygen Therapy  SpO2 100 %  O2 Device Room Air  Pain Assessment  Pain Scale 0-10  Pain Score 0  Dialysis Weight  Weight (!) 173.2 kg  Type of Weight Post-Dialysis  During Hemodialysis Assessment  Blood Flow Rate (mL/min) 300 mL/min  Arterial Pressure (mmHg) -110 mmHg  Venous Pressure (mmHg) 100 mmHg  Transmembrane Pressure (mmHg) 50 mmHg  Ultrafiltration Rate (mL/min) 0 mL/min  Dialysate Flow Rate (mL/min) 600 ml/min  Conductivity: Machine  13.8  HD Safety Checks Performed Yes  KECN 68.2 KECN  Dialysis Fluid Bolus Normal Saline  Bolus Amount (mL) 250 mL  Intra-Hemodialysis Comments Tx completed;Tolerated well  Post-Hemodialysis Assessment  Rinseback Volume (mL) 250 mL  KECN 68.2 V  Dialyzer Clearance Lightly streaked  Duration of HD Treatment -hour(s) 3.5 hour(s)  Hemodialysis Intake (mL) 500 mL  UF Total -Machine (mL) 1508 mL  Net UF (mL) 1008 mL  Tolerated HD Treatment Yes  Hemodialysis Catheter  Placement Date/Time: 02/23/19 1400   Placed prior to admission: No  Site Condition No complications  Blue Lumen Status Heparin locked  Red Lumen Status Heparin locked  Post treatment catheter status Capped and Clamped

## 2019-02-23 NOTE — Progress Notes (Signed)
Pre HD Tx   02/23/19 1545  Hand-Off documentation  Report given to (Full Name) Beatris Ship, RN   Report received from (Full Name) Forestine Chute, RN   Vital Signs  Temp 98.1 F (36.7 C)  Temp Source Oral  Pulse Rate (!) 55  Pulse Rate Source Monitor  Resp 18  BP (!) 154/83  BP Location Left Arm  BP Method Automatic  Patient Position (if appropriate) Lying  Oxygen Therapy  SpO2 98 %  O2 Device Room Air  Pulse Oximetry Type Continuous  Pain Assessment  Pain Scale 0-10  Pain Score 0  Dialysis Weight  Weight (!) 174.5 kg  Type of Weight Pre-Dialysis  Time-Out for Hemodialysis  What Procedure? HD  Pt Identifiers(min of two) First/Last Name;MRN/Account#  Correct Site? Yes  Correct Side? Yes  Correct Procedure? Yes  Consents Verified? Yes  Rad Studies Available? N/A  Safety Precautions Reviewed? Yes  Engineer, civil (consulting) Number 4  Station Number 2  UF/Alarm Test Passed  Conductivity: Meter 14  Conductivity: Machine  14  pH 7.2  Reverse Osmosis Main  Normal Saline Lot Number [id number]  Dialyzer Lot Number [id number]  Disposable Set Lot Number [id number]  Machine Temperature 98.6 F (37 C)  Musician and Audible Yes  Blood Lines Intact and Secured Yes  Pre Treatment Patient Checks  Vascular access used during treatment Catheter  HD catheter dressing before treatment WDL  Hepatitis B Surface Antigen Results Negative  Date Hepatitis B Surface Antigen Drawn 01/24/19  Hepatitis B Surface Antibody  (>10)  Date Hepatitis B Surface Antibody Drawn 11/06/18  Hemodialysis Consent Verified Yes  Hemodialysis Standing Orders Initiated Yes  ECG (Telemetry) Monitor On Yes  Prime Ordered Normal Saline  Length of  DialysisTreatment -hour(s) 3.5 Hour(s)  Dialysis Treatment Comments Na 140  Dialyzer Elisio 17H NR  Dialysate 2K;2.5 Ca  Dialysis Anticoagulant None  Dialysate Flow Ordered 600  Blood Flow Rate Ordered 400 mL/min  Ultrafiltration Goal 2.5 Liters  Dialysis Blood  Pressure Support Ordered Normal Saline  Education / Care Plan  Dialysis Education Provided Yes  Documented Education in Care Plan Yes  Hemodialysis Catheter  Placement Date/Time: 02/23/19 1400   Placed prior to admission: No  Site Condition No complications;Bleeding  Blue Lumen Status Blood return noted  Red Lumen Status Blood return noted  Purple Lumen Status N/A  Catheter fill volume (Arterial) 2 cc  Catheter fill volume (Venous) 2.2  Dressing Type Occlusive;Biopatch  Dressing Status Clean;Dry;Intact  Dressing Change Due 03/02/19

## 2019-02-23 NOTE — Op Note (Signed)
OPERATIVE NOTE    PRE-OPERATIVE DIAGNOSIS: 1. ESRD 2. Failed extremity access  POST-OPERATIVE DIAGNOSIS: same as above  PROCEDURE: 1. Ultrasound guidance for vascular access to the right internal jugular vein 2. Fluoroscopic guidance for placement of catheter 3. Placement of a 23 cm tip to cuff tunneled hemodialysis catheter via the right internal jugular vein  SURGEON: Leotis Pain, MD  ANESTHESIA:  Local with Moderate conscious sedation for approximately 15 minutes using 3 mg of Versed and 75 mcg of Fentanyl  ESTIMATED BLOOD LOSS: 5 cc  FLUORO TIME: less than one minute  CONTRAST: none  FINDING(S): 1.  Patent right internal jugular vein  SPECIMEN(S):  None  INDICATIONS:   [name] is a 48 y.o.male who presents with renal failure and a failed arm access.  The patient needs long term dialysis access for their ESRD, and a Permcath is necessary.  Risks and benefits are discussed and informed consent is obtained.    DESCRIPTION: After obtaining full informed written consent, the patient was brought back to the vascular suited. The patient's right neck and chest were sterilely prepped and draped in a sterile surgical field was created. Moderate conscious sedation was administered during a face to face encounter with the patient throughout the procedure with my supervision of the RN administering medicines and monitoring the patient's vital signs, pulse oximetry, telemetry and mental status throughout from the start of the procedure until the patient was taken to the recovery room.  The right internal jugular vein was visualized with ultrasound and found to be patent. It was then accessed under direct ultrasound guidance and a permanent image was recorded. A wire was placed. After skin nick and dilatation, the peel-away sheath was placed over the wire. I then turned my attention to an area under the clavicle. Approximately 1-2 fingerbreadths below the clavicle a small counterincision  was created and tunneled from the subclavicular incision to the access site. Using fluoroscopic guidance, a 19 centimeter tip to cuff tunneled hemodialysis catheter was selected, and tunneled from the subclavicular incision to the access site. It was then placed through the peel-away sheath and the peel-away sheath was removed. Using fluoroscopic guidance the catheter tips were parked in the right atrium. The appropriate distal connectors were placed. It withdrew blood well and flushed easily with heparinized saline and a concentrated heparin solution was then placed. It was secured to the chest wall with 2 Prolene sutures. The access incision was closed single 4-0 Monocryl. A 4-0 Monocryl pursestring suture was placed around the exit site. Sterile dressings were placed. The patient tolerated the procedure well and was taken to the recovery room in stable condition.  COMPLICATIONS: None  CONDITION: Stable  Leotis Pain, MD 02/23/2019 3:03 PM   This note was created with Dragon Medical transcription system. Any errors in dictation are purely unintentional.

## 2019-02-23 NOTE — Progress Notes (Signed)
HD Tx started w/o issue    02/23/19 1556  Vital Signs  Pulse Rate (!) 55  Pulse Rate Source Monitor  Resp 16  BP (!) 185/163  BP Location Left Arm  BP Method Automatic  Patient Position (if appropriate) Lying  Oxygen Therapy  SpO2 98 %  O2 Device Room Air  During Hemodialysis Assessment  Blood Flow Rate (mL/min) 400 mL/min  Arterial Pressure (mmHg) -180 mmHg  Venous Pressure (mmHg) 170 mmHg  Transmembrane Pressure (mmHg) 50 mmHg  Ultrafiltration Rate (mL/min) 1000 mL/min  Dialysate Flow Rate (mL/min) 600 ml/min  Conductivity: Machine  14  HD Safety Checks Performed Yes  Dialysis Fluid Bolus Normal Saline  Bolus Amount (mL) 250 mL  Intra-Hemodialysis Comments Tx initiated

## 2019-02-23 NOTE — Progress Notes (Signed)
[place], [place] 02/23/19  Subjective:   LOS: 1 06/25 0701 - 06/26 0700 In: 120 [P.O.:120] Out: -  Patient known to our practice from outpatient dialysis.  He underwent angioplasty and declot of his access couple of days ago.   He was brought in for further management of his clotted access. At present he is on room air.  Denies any shortness of breath.  Continues to have some leg edema.  Objective:  Vital signs in last 24 hours:  Temp:  [97.9 F (36.6 C)-99.1 F (37.3 C)] 98.2 F (36.8 C) (06/26 1313) Pulse Rate:  [48-61] 59 (06/26 1455) Resp:  [10-21] 10 (06/26 1455) BP: (125-203)/(54-99) 199/79 (06/26 1455) SpO2:  [96 %-100 %] 99 % (06/26 1455) Weight:  [156.4 kg-174.5 kg] 174.5 kg (06/26 1313)  Weight change:  Filed Weights   02/22/19 1546 02/23/19 0748 02/23/19 1313  Weight: (!) 156.4 kg (!) 174.5 kg (!) 174.5 kg    Intake/Output:    Intake/Output Summary (Last 24 hours) at 02/23/2019 1506 Last data filed at 02/23/2019 1000 Gross per 24 hour  Intake 150 ml  Output -  Net 150 ml     Physical Exam: General:  Sitting up in chair, no acute distress  HEENT  anicteric, moist oral mucous membranes  Neck  supple, no masses  Pulm/lungs  normal respiratory effort on room air, no crackles  CVS/Heart  regular rhythm, bradycardic  Abdomen:   Soft, nontender  Extremities:  Chronic lymphedema, also has 2-3+ pitting edema  Neurologic:  Alert, oriented  Skin:  No acute rashes  Access:  Right arm AV fistula, no bruit or thrill       Basic Metabolic Panel:  Recent Labs  Lab 02/20/19 1607 02/21/19 0242 02/22/19 1636 02/23/19 0432  NA  --  139 138  138 139  K 4.3 4.5 5.6*  5.5* 5.3*  CL  --  100 100  101 102  CO2  --  26 23  23 22   GLUCOSE  --  149* 121*  123* 112*  BUN  --  93* 97*  106* 115*  CREATININE  --  13.07* 15.60*  15.20* 16.09*  CALCIUM  --  8.3* 8.2*  8.2* 8.1*  PHOS  --   --  7.2*  --      CBC: Recent Labs   Lab 02/21/19 0242 02/22/19 1636  WBC 4.7 6.2  NEUTROABS  --  4.3  HGB 10.5* 10.8*  HCT 31.8* 33.4*  MCV 91.9 94.4  PLT 136* 132*     No results found for: HEPBSAG, HEPBSAB, HEPBIGM    Microbiology:  Recent Results (from the past 240 hour(s))  SARS Coronavirus 2 (CEPHEID - Performed in [place] hospital lab), Hosp Order     Status: None   Collection Time: 02/20/19  8:50 AM   Specimen: Nasopharyngeal Swab  Result Value Ref Range Status   SARS Coronavirus 2 NEGATIVE NEGATIVE Final    Comment: (NOTE) If result is NEGATIVE SARS-CoV-2 target nucleic acids are NOT DETECTED. The SARS-CoV-2 RNA is generally detectable in upper and lower  respiratory specimens during the acute phase of infection. The lowest  concentration of SARS-CoV-2 viral copies this assay can detect is 250  copies / mL. A negative result does not preclude SARS-CoV-2 infection  and should not be used as the sole basis for treatment or other  patient management decisions.  A negative result may occur with  improper specimen collection / handling, submission of  specimen other  than nasopharyngeal swab, presence of viral mutation(s) within the  areas targeted by this assay, and inadequate number of viral copies  (<250 copies / mL). A negative result must be combined with clinical  observations, patient history, and epidemiological information. If result is POSITIVE SARS-CoV-2 target nucleic acids are DETECTED. The SARS-CoV-2 RNA is generally detectable in upper and lower  respiratory specimens dur ing the acute phase of infection.  Positive  results are indicative of active infection with SARS-CoV-2.  Clinical  correlation with patient history and other diagnostic information is  necessary to determine patient infection status.  Positive results do  not rule out bacterial infection or co-infection with other viruses. If result is PRESUMPTIVE POSTIVE SARS-CoV-2 nucleic acids MAY BE PRESENT.   A presumptive  positive result was obtained on the submitted specimen  and confirmed on repeat testing.  While 2019 novel coronavirus  (SARS-CoV-2) nucleic acids may be present in the submitted sample  additional confirmatory testing may be necessary for epidemiological  and / or clinical management purposes  to differentiate between  SARS-CoV-2 and other Sarbecovirus currently known to infect humans.  If clinically indicated additional testing with an alternate test  methodology [phone]) is advised. The SARS-CoV-2 RNA is generally  detectable in upper and lower respiratory sp ecimens during the acute  phase of infection. The expected result is Negative. Fact Sheet for Patients:  StrictlyIdeas.no Fact Sheet for Healthcare Providers: BankingDealers.co.za This test is not yet approved or cleared by the [place] FDA and has been authorized for detection and/or diagnosis of SARS-CoV-2 by FDA under an Emergency Use Authorization (EUA).  This EUA will remain in effect (meaning this test can be used) for the duration of the COVID-19 declaration under Section 564(b)(1) of the Act, 21 U.S.C. section 360bbb-3(b)(1), unless the authorization is terminated or revoked sooner. Performed at [facility], [address] [place], [place] [address]   MRSA PCR Screening     Status: None   Collection Time: 02/20/19  8:14 PM   Specimen: Nasopharyngeal  Result Value Ref Range Status   MRSA by PCR NEGATIVE NEGATIVE Final    Comment:        The GeneXpert MRSA Assay (FDA approved for NASAL specimens only), is one component of a comprehensive MRSA colonization surveillance program. It is not intended to diagnose MRSA infection nor to guide or monitor treatment for MRSA infections. Performed at [facility], [address] [place], [place] [address]     Coagulation Studies: Recent Labs    02/22/19 1636  LABPROT 13.1  INR 1.0     Urinalysis: No results for input(s): COLORURINE, LABSPEC, PHURINE, GLUCOSEU, HGBUR, BILIRUBINUR, KETONESUR, PROTEINUR, UROBILINOGEN, NITRITE, LEUKOCYTESUR in the last 72 hours.  Invalid input(s): APPERANCEUR    Imaging: No results found.   Medications:   . [MAR Hold] sodium chloride    . sodium chloride 10 mL/hr at 02/23/19 1321  .  ceFAZolin (ANCEF) IV 1 g (02/23/19 1439)   . [MAR Hold] allopurinol  100 mg Oral Daily  . [MAR Hold] amLODipine  10 mg Oral Daily  . [MAR Hold] atenolol  50 mg Oral Daily  . [MAR Hold] calcium acetate  1,334 mg Oral TID WC  . [MAR Hold] Chlorhexidine Gluconate Cloth  6 each Topical Q0600  . fentaNYL      . hydrALAZINE      . [MAR Hold] insulin glargine  10 Units Subcutaneous BID  . [MAR Hold] metolazone  5 mg  Oral Q M,W,F  . midazolam      . [MAR Hold] multivitamin with minerals  1 tablet Oral Daily  . [MAR Hold] omega-3 acid ethyl esters  1 g Oral Daily  . patiromer  16.8 g Oral Daily  . [MAR Hold] sodium chloride flush  3 mL Intravenous Q12H  . [MAR Hold] torsemide  100 mg Oral Once  . [MAR Hold] vitamin C with rose hips  1,000 mg Oral Daily   [MAR Hold] sodium chloride, [MAR Hold] acetaminophen **OR** [MAR Hold] acetaminophen, [MAR Hold] albuterol, diphenhydrAMINE, famotidine, [MAR Hold] heparin, [MAR Hold]  HYDROmorphone (DILAUDID) injection, [MAR Hold] lidocaine-prilocaine, [MAR Hold] Melatonin, methylPREDNISolone (SOLU-MEDROL) injection, midazolam, [MAR Hold] ondansetron **OR** [MAR Hold] ondansetron (ZOFRAN) IV, [MAR Hold] ondansetron (ZOFRAN) IV, [MAR Hold] sodium chloride flush  Assessment/ Plan:  48 y.o. male with end-stage renal disease, diabetes, hypertension is admitted for management of hyperkalemia and access malfunction  Active Problems:   AVF (arteriovenous fistula) ([place])   #.  Complication of dialysis access, clotted AV fistula  - plan for tunneled dialysis cathter - Dialysis tonight and on saturday  #. Anemia of  CKD  Lab Results  Component Value Date   HGB 10.8 (L) 02/22/2019  Will continue to monitor  #. SHPTH  No results found for: PTH Lab Results  Component Value Date   PHOS 7.2 (H) 02/22/2019   #. Diabetes type 2 with CKD Hgb A1c MFr Bld (%)  Date Value  10/01/2017 15.1 (H)   #Volume overload, with lower extremity edema Volume removal with HD as tolerated.    #Hypertension with CKD Continue home antihypertensive regimen    LOS: [place] 6/26/20203:06 [place], [place]

## 2019-02-23 NOTE — Progress Notes (Signed)
UF turned off and BFR reduced for symptomatic hypotension, MD aware.    02/23/19 1720  Vital Signs  Pulse Rate (!) 53  Resp 16  BP 104/60 (B/P improved, pt UF turned off, still feeling symptomatic )  During Hemodialysis Assessment  Blood Flow Rate (mL/min) 300 mL/min  Arterial Pressure (mmHg) -100 mmHg  Venous Pressure (mmHg) 110 mmHg  Transmembrane Pressure (mmHg) 50 mmHg  Ultrafiltration Rate (mL/min) 0 mL/min  Dialysate Flow Rate (mL/min) 600 ml/min  Conductivity: Machine  14  HD Safety Checks Performed Yes  Intra-Hemodialysis Comments See progress note

## 2019-02-23 NOTE — H&P (Signed)
[facility] History & Physical Update  The patient was interviewed and re-examined.  The patient's previous History and Physical has been reviewed and is unchanged.  Arm access is not salvageable at this point and will plan on a permcath and an outpatient work up for new access. There is no change in the plan of care. We plan to proceed with the scheduled procedure.  Leotis Pain, MD  02/23/2019, 2:01 PM

## 2019-02-24 ENCOUNTER — Inpatient Hospital Stay: Payer: Medicare Other

## 2019-02-24 LAB — CBC
HCT: 30 % — ABNORMAL LOW (ref 39.0–52.0)
Hemoglobin: 9.6 g/dL — ABNORMAL LOW (ref 13.0–17.0)
MCH: 30 pg (ref 26.0–34.0)
MCHC: 32 g/dL (ref 30.0–36.0)
MCV: 93.8 fL (ref 80.0–100.0)
Platelets: 128 10*3/uL — ABNORMAL LOW (ref 150–400)
RBC: 3.2 MIL/uL — ABNORMAL LOW (ref 4.22–5.81)
RDW: 13.7 % (ref 11.5–15.5)
WBC: 5.9 10*3/uL (ref 4.0–10.5)
nRBC: 0 % (ref 0.0–0.2)

## 2019-02-24 LAB — GLUCOSE, CAPILLARY
Glucose-Capillary: 132 mg/dL — ABNORMAL HIGH (ref 70–99)
Glucose-Capillary: 117 mg/dL — ABNORMAL HIGH (ref 70–99)
Glucose-Capillary: 135 mg/dL — ABNORMAL HIGH (ref 70–99)

## 2019-02-24 LAB — HEMOGLOBIN A1C
Hgb A1c MFr Bld: 8.3 % — ABNORMAL HIGH (ref 4.8–5.6)
Mean Plasma Glucose: 192 mg/dL

## 2019-02-24 MED ORDER — OMEGA-3-ACID ETHYL ESTERS 1 G PO CAPS: 1.0000 g | ORAL_CAPSULE | Freq: Every day | ORAL | 0 refills | Status: DC

## 2019-02-24 NOTE — Progress Notes (Signed)
[place], [place] 02/24/19  Subjective:   LOS: 2 06/26 0701 - 06/27 0700 In: 270 [P.O.:270] Out: 1008  Patient known to our practice from outpatient dialysis.  He underwent angioplasty and declot of his access couple of days ago.   He was brought in for further management of his clotted access. At present he is on room air.     HEMODIALYSIS FLOWSHEET:  Blood Flow Rate (mL/min): 400 mL/min Arterial Pressure (mmHg): -170 mmHg Venous Pressure (mmHg): 130 mmHg Transmembrane Pressure (mmHg): 60 mmHg Ultrafiltration Rate (mL/min): 860 mL/min Dialysate Flow Rate (mL/min): 600 ml/min Conductivity: Machine : 14 Conductivity: Machine : 14 Dialysis Fluid Bolus: Normal Saline Bolus Amount (mL): 250 mL  Reports swelling and warmth in right arm near AV access  Objective:  Vital signs in last 24 hours:  Temp:  [98.1 F (36.7 C)-99 F (37.2 C)] 98.4 F (36.9 C) (06/27 1007) Pulse Rate:  [48-60] 52 (06/27 1200) Resp:  [8-23] 18 (06/27 1200) BP: (83-214)/(53-163) 142/68 (06/27 1200) SpO2:  [97 %-100 %] 98 % (06/27 1200) Weight:  [173.2 kg-174.5 kg] 174.4 kg (06/27 1007)  Weight change: 18.1 kg Filed Weights   02/23/19 1919 02/24/19 0900 02/24/19 1007  Weight: (!) 173.2 kg (!) 174.1 kg (!) 174.4 kg    Intake/Output:    Intake/Output Summary (Last 24 hours) at 02/24/2019 1215 Last data filed at 02/23/2019 1919 Gross per 24 hour  Intake -  Output 1008 ml  Net -1008 ml     Physical Exam: General:  Sitting up in chair, no acute distress  HEENT  anicteric, moist oral mucous membranes  Neck  supple, no masses  Pulm/lungs  normal respiratory effort on room air, no crackles  CVS/Heart  regular rhythm, bradycardic  Abdomen:   Soft, nontender  Extremities:  Chronic lymphedema, also has 2-3+ pitting edema  Neurologic:  Alert, oriented  Skin:  No acute rashes  Access:  Right arm AV fistula, no bruit or thrill       Basic Metabolic Panel:  Recent  Labs  Lab 02/20/19 1607 02/21/19 0242 02/22/19 1636 02/23/19 0432  NA  --  139 138  138 139  K 4.3 4.5 5.6*  5.5* 5.3*  CL  --  100 100  101 102  CO2  --  26 23  23 22   GLUCOSE  --  149* 121*  123* 112*  BUN  --  93* 97*  106* 115*  CREATININE  --  13.07* 15.60*  15.20* 16.09*  CALCIUM  --  8.3* 8.2*  8.2* 8.1*  PHOS  --   --  7.2*  --      CBC: Recent Labs  Lab 02/21/19 0242 02/22/19 1636  WBC 4.7 6.2  NEUTROABS  --  4.3  HGB 10.5* 10.8*  HCT 31.8* 33.4*  MCV 91.9 94.4  PLT 136* 132*     No results found for: HEPBSAG, HEPBSAB, HEPBIGM    Microbiology:  Recent Results (from the past 240 hour(s))  SARS Coronavirus 2 (CEPHEID - Performed in [place] hospital lab), Hosp Order     Status: None   Collection Time: 02/20/19  8:50 AM   Specimen: Nasopharyngeal Swab  Result Value Ref Range Status   SARS Coronavirus 2 NEGATIVE NEGATIVE Final    Comment: (NOTE) If result is NEGATIVE SARS-CoV-2 target nucleic acids are NOT DETECTED. The SARS-CoV-2 RNA is generally detectable in upper and lower  respiratory specimens during the acute phase of infection. The lowest  concentration of SARS-CoV-2 viral copies this assay can detect is 250  copies / mL. A negative result does not preclude SARS-CoV-2 infection  and should not be used as the sole basis for treatment or other  patient management decisions.  A negative result may occur with  improper specimen collection / handling, submission of specimen other  than nasopharyngeal swab, presence of viral mutation(s) within the  areas targeted by this assay, and inadequate number of viral copies  (<250 copies / mL). A negative result must be combined with clinical  observations, patient history, and epidemiological information. If result is POSITIVE SARS-CoV-2 target nucleic acids are DETECTED. The SARS-CoV-2 RNA is generally detectable in upper and lower  respiratory specimens dur ing the acute phase of infection.   Positive  results are indicative of active infection with SARS-CoV-2.  Clinical  correlation with patient history and other diagnostic information is  necessary to determine patient infection status.  Positive results do  not rule out bacterial infection or co-infection with other viruses. If result is PRESUMPTIVE POSTIVE SARS-CoV-2 nucleic acids MAY BE PRESENT.   A presumptive positive result was obtained on the submitted specimen  and confirmed on repeat testing.  While 2019 novel coronavirus  (SARS-CoV-2) nucleic acids may be present in the submitted sample  additional confirmatory testing may be necessary for epidemiological  and / or clinical management purposes  to differentiate between  SARS-CoV-2 and other Sarbecovirus currently known to infect humans.  If clinically indicated additional testing with an alternate test  methodology [phone]) is advised. The SARS-CoV-2 RNA is generally  detectable in upper and lower respiratory sp ecimens during the acute  phase of infection. The expected result is Negative. Fact Sheet for Patients:  StrictlyIdeas.no Fact Sheet for Healthcare Providers: BankingDealers.co.za This test is not yet approved or cleared by the [place] FDA and has been authorized for detection and/or diagnosis of SARS-CoV-2 by FDA under an Emergency Use Authorization (EUA).  This EUA will remain in effect (meaning this test can be used) for the duration of the COVID-19 declaration under Section 564(b)(1) of the Act, 21 U.S.C. section 360bbb-3(b)(1), unless the authorization is terminated or revoked sooner. Performed at [facility], [address] [place], [place] [address]   MRSA PCR Screening     Status: None   Collection Time: 02/20/19  8:14 PM   Specimen: Nasopharyngeal  Result Value Ref Range Status   MRSA by PCR NEGATIVE NEGATIVE Final    Comment:        The GeneXpert MRSA Assay (FDA approved  for NASAL specimens only), is one component of a comprehensive MRSA colonization surveillance program. It is not intended to diagnose MRSA infection nor to guide or monitor treatment for MRSA infections. Performed at [facility], [address] [place], [place] [address]     Coagulation Studies: Recent Labs    02/22/19 1636  LABPROT 13.1  INR 1.0    Urinalysis: No results for input(s): COLORURINE, LABSPEC, PHURINE, GLUCOSEU, HGBUR, BILIRUBINUR, KETONESUR, PROTEINUR, UROBILINOGEN, NITRITE, LEUKOCYTESUR in the last 72 hours.  Invalid input(s): APPERANCEUR    Imaging: No results found.   Medications:   . sodium chloride     . allopurinol  100 mg Oral Daily  . amLODipine  10 mg Oral Daily  . atenolol  50 mg Oral Daily  . calcium acetate  1,334 mg Oral TID WC  . Chlorhexidine Gluconate Cloth  6 each Topical Q0600  . insulin glargine  10 Units Subcutaneous  BID  . metolazone  5 mg Oral Q M,W,F  . multivitamin with minerals  1 tablet Oral Daily  . omega-3 acid ethyl esters  1 g Oral Daily  . sodium chloride flush  3 mL Intravenous Q12H  . torsemide  100 mg Oral Once  . vitamin C with rose hips  1,000 mg Oral Daily   sodium chloride, acetaminophen **OR** acetaminophen, albuterol, heparin, lidocaine-prilocaine, Melatonin, ondansetron **OR** ondansetron (ZOFRAN) IV, ondansetron (ZOFRAN) IV, sodium chloride flush  Assessment/ Plan:  48 y.o.African American male with end-stage renal disease, diabetes, hypertension is admitted for management of hyperkalemia and access malfunction  Active Problems:   AVF (arteriovenous fistula) ([facility])  [facility]/ MWF-3/ 270 min/ 161.5 kg  #.  Complication of dialysis access, clotted AV fistula  - Dialysis via tunneled dialysis cathter - Patient seen during dialysis Tolerating well   #. Anemia of CKD  Lab Results  Component Value Date   HGB 10.8 (L) 02/22/2019  Will continue to monitor  #. SHPTH  No  results found for: PTH Lab Results  Component Value Date   PHOS 7.2 (H) 02/22/2019  continue calcium acetate with meals  #. Diabetes type 2 with CKD Hgb A1c MFr Bld (%)  Date Value  02/23/2019 8.3 (H)   #Volume overload, with lower extremity edema Volume removal with HD as tolerated.    #Hypertension with CKD Continue home antihypertensive regimen  Right arm swelling  Appears to be hematoma Will obtain ultrasound   LOS: [place] 6/27/202012:15 [place], [place]

## 2019-02-24 NOTE — Discharge Summary (Signed)
[facility] NAME: [name]    MR#:  [id number]  DATE OF BIRTH:  November 23, 1970  DATE OF ADMISSION:  02/22/2019 ADMITTING PHYSICIAN: Loletha Grayer, MD  DATE OF DISCHARGE: 02/24/2019  PRIMARY CARE PHYSICIAN: Dayton Martes Victoriano Lain, NP    ADMISSION DIAGNOSIS:  Complication of Dialysis Access  DISCHARGE DIAGNOSIS:  Active Problems:   AVF (arteriovenous fistula) ([facility])   SECONDARY DIAGNOSIS:   Past Medical History:  Diagnosis Date  . Anxiety   . Chronic kidney disease    14% FUNCTION  . Depression   . Diabetes mellitus without complication ([place])   . Dyspnea    DOE  . Dysrhythmia   . History of orthopnea   . Hypertension   . Lymphedema of leg   . Microalbuminuria   . Sleep apnea    no CPAP  . Vitamin D deficiency     HOSPITAL COURSE:  HISTORY OF PRESENT ILLNESS: [name]  is a 48 y.o. male with a known history per below, discharged on yesterday for end-stage renal disease with hyperkalemia, returns today as a direct admission by request by nephrology/Dr. [name] for clotted AV fistula, patient now be admitted for acute clotted AV fistula.  1. Complication with AV fistula and unable to do dialysis.  Patient had PermCath placement 6/26 and dialysis yesterday and today.    Okay to discharge patient from nephrology standpoint after today's dialysis 2. End-stage renal disease on hemodialysis Monday Wednesday Friday 3. Lower extremity edema and lymphedema.  Dialysis to help manage fluid. 4. Hypertension continue amlodipine, atenolol torsemide and metolazone. 5. History of gout on low-dose allopurinol 6. Hyperkalemia.  Given patiromer.    Patient had hemodialysis yesterday and will get dialysis today , dialysis will manage. 7. Type 2 diabetes.  So far sugars have been on the lower side.  hemoglobin A1c.8.3.  Patient needs tight control of the sugars.  Diabetic diet.  PCP to consider referring the patient endocrinology if  needed DISCHARGE CONDITIONS:   stable  CONSULTS OBTAINED:  Treatment Team:  Murlean Iba, MD Algernon Huxley, MD   PROCEDURES PermCath placement on 02/23/2019  DRUG ALLERGIES:   Allergies  Allergen Reactions  . Lisinopril Swelling  . Adhesive [Tape] Itching  . Furosemide Other (See Comments)    Light headed, cough, blurry vision, weakness.    DISCHARGE MEDICATIONS:   Allergies as of 02/24/2019      Reactions   Lisinopril Swelling   Adhesive [tape] Itching   Furosemide Other (See Comments)   Light headed, cough, blurry vision, weakness.      Medication List    TAKE these medications   allopurinol 100 MG tablet Commonly known as: ZYLOPRIM Take 100 mg by mouth daily.   amLODipine 10 MG tablet Commonly known as: NORVASC Take 10 mg by mouth daily.   apixaban 5 MG Tabs tablet Commonly known as: ELIQUIS Take 5 mg by mouth 2 (two) times daily.   atenolol 50 MG tablet Commonly known as: TENORMIN Take 1 tablet (50 mg total) by mouth daily.   calcium acetate 667 MG capsule Commonly known as: PHOSLO Take 1,334 mg by mouth 3 (three) times daily with meals.   Cinnamon 500 MG capsule Take 500 mg by mouth daily.   EYE HEALTH PO Take 1 capsule by mouth daily.   Lantus SoloStar 100 UNIT/ML Solostar Pen Generic drug: Insulin Glargine Inject 10-20 Units into the skin 2 (two) times daily.   lidocaine-prilocaine cream Commonly  known as: EMLA Apply 1 application topically as needed (for fistula).   Melatonin 5 MG Caps Take 5 mg by mouth at bedtime as needed (sleep).   metolazone 5 MG tablet Commonly known as: ZAROXOLYN Take 5 mg by mouth every Monday, Wednesday, and Friday.   multivitamin with minerals Tabs tablet Take 1 tablet by mouth daily.   Omega-3 1000 MG Caps Take 1,000 mg by mouth daily.   omega-3 acid ethyl esters 1 g capsule Commonly known as: LOVAZA Take 1 capsule (1 g total) by mouth daily.   OVER THE COUNTER MEDICATION Take 1 tablet by mouth  daily. Alpha King Supreme testosterone booster   OVER THE COUNTER MEDICATION Take 1 capsule by mouth daily. Nugenix testosterone booster   torsemide 20 MG tablet Commonly known as: DEMADEX Take 100 mg by mouth once.   vitamin C with rose hips 1000 MG tablet Take 1,000 mg by mouth daily.        DISCHARGE INSTRUCTIONS:   Follow-up with primary care physician in 3 days Follow up with nephrology in week, continue hemodialysis on Monday Wednesday Friday   DIET:  Renal diet  DISCHARGE CONDITION:  Fair  ACTIVITY:  Activity as tolerated  OXYGEN:  Home Oxygen: No.   Oxygen Delivery: room air  DISCHARGE LOCATION:  home   If you experience worsening of your admission symptoms, develop shortness of breath, life threatening emergency, suicidal or homicidal thoughts you must seek medical attention immediately by calling 911 or calling your MD immediately  if symptoms less severe.  You Must read complete instructions/literature along with all the possible adverse reactions/side effects for all the Medicines you take and that have been prescribed to you. Take any new Medicines after you have completely understood and accpet all the possible adverse reactions/side effects.   Please note  You were cared for by a hospitalist during your hospital stay. If you have any questions about your discharge medications or the care you received while you were in the hospital after you are discharged, you can call the unit and asked to speak with the hospitalist on call if the hospitalist that took care of you is not available. Once you are discharged, your primary care physician will handle any further medical issues. Please note that NO REFILLS for any discharge medications will be authorized once you are discharged, as it is imperative that you return to your primary care physician (or establish a relationship with a primary care physician if you do not have one) for your aftercare needs so that  they can reassess your need for medications and monitor your lab values.     Today  No chief complaint on file.  Patient is resting comfortably.  Denies any complaints.  For dialysis today.  Okay to discharge patient from nephrology standpoint after hemodialysis today  ROS:  CONSTITUTIONAL: Denies fevers, chills. Denies any fatigue, weakness.  EYES: Denies blurry vision, double vision, eye pain. EARS, NOSE, THROAT: Denies tinnitus, ear pain, hearing loss. RESPIRATORY: Denies cough, wheeze, shortness of breath.  CARDIOVASCULAR: Denies chest pain, palpitations, edema.  GASTROINTESTINAL: Denies nausea, vomiting, diarrhea, abdominal pain. Denies bright red blood per rectum. GENITOURINARY: Denies dysuria, hematuria. ENDOCRINE: Denies nocturia or thyroid problems. HEMATOLOGIC AND LYMPHATIC: Denies easy bruising or bleeding. SKIN: Denies rash or lesion. MUSCULOSKELETAL: Denies pain in neck, back, shoulder, knees, hips or arthritic symptoms.  NEUROLOGIC: Denies paralysis, paresthesias.  PSYCHIATRIC: Denies anxiety or depressive symptoms.   VITAL SIGNS:  Blood pressure (!) 153/60, pulse (!) 50,  temperature 98.4 F (36.9 C), temperature source Oral, resp. rate 18, height 6\' 4"  (1.93 m), weight (!) 174.4 kg, SpO2 98 %.  I/O:    Intake/Output Summary (Last 24 hours) at 02/24/2019 1053 Last data filed at 02/23/2019 1919 Gross per 24 hour  Intake 240 ml  Output 1008 ml  Net -768 ml    PHYSICAL EXAMINATION:  GENERAL:  48 y.o.-year-old patient lying in the bed with no acute distress.  EYES: Pupils equal, round, reactive to light and accommodation. No scleral icterus. Extraocular muscles intact.  HEENT: Head atraumatic, normocephalic. Oropharynx and nasopharynx clear.  NECK:  Supple, no jugular venous distention. No thyroid enlargement, no tenderness.  LUNGS: Normal breath sounds bilaterally, no wheezing, rales,rhonchi or crepitation. No use of accessory muscles of respiration.   CARDIOVASCULAR: S1, S2 normal. No murmurs, rubs, or gallops.  ABDOMEN: Soft, non-tender, non-distended. Bowel sounds present. No organomegaly or mass.  EXTREMITIES: No pedal edema, cyanosis, or clubbing.  NEUROLOGIC: Cranial nerves II through XII are intact. Muscle strength at his baseline in all extremities. Sensation intact. Gait not checked.  PSYCHIATRIC: The patient is alert and oriented x 3.  SKIN: No obvious rash, lesion, or ulcer.   DATA REVIEW:   CBC Recent Labs  Lab 02/22/19 1636  WBC 6.2  HGB 10.8*  HCT 33.4*  PLT 132*    Chemistries  Recent Labs  Lab 02/22/19 1636 02/23/19 0432  NA 138  138 139  K 5.6*  5.5* 5.3*  CL 100  101 102  CO2 23  23 22   GLUCOSE 121*  123* 112*  BUN 97*  106* 115*  CREATININE 15.60*  15.20* 16.09*  CALCIUM 8.2*  8.2* 8.1*  AST 13*  --   ALT 12  --   ALKPHOS 69  --   BILITOT 0.3  --     Cardiac Enzymes No results for input(s): TROPONINI in the last 168 hours.  Microbiology Results  Results for orders placed or performed during the hospital encounter of 02/20/19  MRSA PCR Screening     Status: None   Collection Time: 02/20/19  8:14 PM   Specimen: Nasopharyngeal  Result Value Ref Range Status   MRSA by PCR NEGATIVE NEGATIVE Final    Comment:        The GeneXpert MRSA Assay (FDA approved for NASAL specimens only), is one component of a comprehensive MRSA colonization surveillance program. It is not intended to diagnose MRSA infection nor to guide or monitor treatment for MRSA infections. Performed at [facility], [address] [place], [place] [address]     RADIOLOGY:  No results found.  EKG:   Orders placed or performed during the hospital encounter of 07/14/18  . EKG test  . EKG test      Management plans discussed with the patient, he is in agreement.  CODE STATUS:     Code Status Orders  (From admission, onward)         Start     Ordered   02/22/19 1624  Full code   Continuous     02/22/19 1623        Code Status History    Date Active Date Inactive Code Status Order ID Comments User Context   02/20/2019 1413 02/22/2019 0114 Full Code [id number]  Sela Hua, MD Inpatient   10/01/2017 1321 10/19/2017 2017 Full Code [id number]  [name], MD Inpatient   Advance Care Planning Activity    Advance Directive Documentation  Most Recent Value  Type of Advance Directive  Healthcare Power of Attorney  Pre-existing out of facility DNR order (yellow form or pink MOST form)  -  "MOST" Form in Place?  -      TOTAL TIME TAKING CARE OF THIS PATIENT: 45  minutes.   Note: This dictation was prepared with Dragon dictation along with smaller phrase technology. Any transcriptional errors that result from this process are unintentional.   @[facility] @  on 02/24/2019 at 10:53 AM  Between 7am to 6pm - Pager - [phone]  After 6pm go to [email or web address] - password [email or web address] [facility]  Office  [phone]  CC: Primary care physician; Sallee Lange, NP

## 2019-02-24 NOTE — Progress Notes (Signed)
PRE MACHINE CHECK

## 2019-02-24 NOTE — Progress Notes (Signed)
HD TX END

## 2019-02-24 NOTE — Progress Notes (Signed)
HD START   02/24/19 1015  Vital Signs  Pulse Rate (!) 53  Pulse Rate Source Monitor  Resp 17  BP (!) 145/65  BP Location Left Arm  BP Method Automatic  Patient Position (if appropriate) Lying  Oxygen Therapy  SpO2 98 %  O2 Device Room Air  During Hemodialysis Assessment  Blood Flow Rate (mL/min) 400 mL/min  Arterial Pressure (mmHg) -170 mmHg  Venous Pressure (mmHg) 120 mmHg  Transmembrane Pressure (mmHg) 30 mmHg  Ultrafiltration Rate (mL/min) 860 mL/min  Dialysate Flow Rate (mL/min) 600 ml/min  Conductivity: Machine  14  HD Safety Checks Performed Yes  Dialysis Fluid Bolus Normal Saline  Bolus Amount (mL) 250 mL  Intra-Hemodialysis Comments Tx initiated ([facility] CARE PER POLICY)

## 2019-02-25 LAB — CBC WITH DIFFERENTIAL/PLATELET
Abs Immature Granulocytes: 0.01 10*3/uL (ref 0.00–0.07)
Basophils Absolute: 0 10*3/uL (ref 0.0–0.1)
Basophils Relative: 0 %
Eosinophils Absolute: 0.2 10*3/uL (ref 0.0–0.5)
Eosinophils Relative: 3 %
HCT: 32.1 % — ABNORMAL LOW (ref 39.0–52.0)
Hemoglobin: 10.1 g/dL — ABNORMAL LOW (ref 13.0–17.0)
Immature Granulocytes: 0 %
Lymphocytes Relative: 15 %
Lymphs Abs: 0.8 10*3/uL (ref 0.7–4.0)
MCH: 30.1 pg (ref 26.0–34.0)
MCHC: 31.5 g/dL (ref 30.0–36.0)
MCV: 95.5 fL (ref 80.0–100.0)
Monocytes Absolute: 0.7 10*3/uL (ref 0.1–1.0)
Monocytes Relative: 13 %
Neutro Abs: 3.9 10*3/uL (ref 1.7–7.7)
Neutrophils Relative %: 69 %
Platelets: 137 10*3/uL — ABNORMAL LOW (ref 150–400)
RBC: 3.36 MIL/uL — ABNORMAL LOW (ref 4.22–5.81)
RDW: 13.6 % (ref 11.5–15.5)
WBC: 5.6 10*3/uL (ref 4.0–10.5)
nRBC: 0 % (ref 0.0–0.2)

## 2019-02-25 LAB — GLUCOSE, CAPILLARY: Glucose-Capillary: 133 mg/dL — ABNORMAL HIGH (ref 70–99)

## 2019-02-25 NOTE — Progress Notes (Signed)
[place], [place] 02/25/19  Subjective:   LOS: 3 06/27 0701 - 06/28 0700 In: -  Out: 3000  Patient known to our practice from outpatient dialysis.  He underwent angioplasty and declot of his access couple of days ago.   He was brought in for further management of his clotted access. Doing well today.  Denies any acute complaints.  Right arm hematoma is improving.  Objective:  Vital signs in last 24 hours:  Temp:  [97.9 F (36.6 C)-99.2 F (37.3 C)] 97.9 F (36.6 C) (06/28 0451) Pulse Rate:  [48-60] 58 (06/28 0451) Resp:  [15-21] 18 (06/28 0451) BP: (130-156)/(49-85) 136/67 (06/28 0451) SpO2:  [97 %-100 %] 100 % (06/28 0451) Weight:  [170.9 kg-174.4 kg] 172 kg (06/28 0451)  Weight change: -0.4 kg Filed Weights   02/24/19 1007 02/24/19 1345 02/25/19 0451  Weight: (!) 174.4 kg (!) 170.9 kg (!) 172 kg    Intake/Output:    Intake/Output Summary (Last 24 hours) at 02/25/2019 0853 Last data filed at 02/24/2019 1812 Gross per 24 hour  Intake -  Output 3000 ml  Net -3000 ml     Physical Exam: General:  Sitting up in chair, no acute distress  HEENT  anicteric, moist oral mucous membranes  Neck  supple, no masses  Pulm/lungs  normal respiratory effort on room air, no crackles  CVS/Heart  regular rhythm,  Abdomen:   Soft, nontender  Extremities:  Chronic lymphedema, also has 2-3+ pitting edema  Neurologic:  Alert, oriented  Skin:  No acute rashes  Access:  Right arm AV fistula, no bruit or thrill   Right IJ PermCath    Basic Metabolic Panel:  Recent Labs  Lab 02/20/19 1607 02/21/19 0242 02/22/19 1636 02/23/19 0432  NA  --  139 138  138 139  K 4.3 4.5 5.6*  5.5* 5.3*  CL  --  100 100  101 102  CO2  --  26 23  23 22   GLUCOSE  --  149* 121*  123* 112*  BUN  --  93* 97*  106* 115*  CREATININE  --  13.07* 15.60*  15.20* 16.09*  CALCIUM  --  8.3* 8.2*  8.2* 8.1*  PHOS  --   --  7.2*  --      CBC: Recent Labs  Lab  02/21/19 0242 02/22/19 1636 02/24/19 1300 02/25/19 0612  WBC 4.7 6.2 5.9 5.6  NEUTROABS  --  4.3  --  3.9  HGB 10.5* 10.8* 9.6* 10.1*  HCT 31.8* 33.4* 30.0* 32.1*  MCV 91.9 94.4 93.8 95.5  PLT 136* 132* 128* 137*     No results found for: HEPBSAG, HEPBSAB, HEPBIGM    Microbiology:  Recent Results (from the past 240 hour(s))  SARS Coronavirus 2 (CEPHEID - Performed in [place] hospital lab), Hosp Order     Status: None   Collection Time: 02/20/19  8:50 AM   Specimen: Nasopharyngeal Swab  Result Value Ref Range Status   SARS Coronavirus 2 NEGATIVE NEGATIVE Final    Comment: (NOTE) If result is NEGATIVE SARS-CoV-2 target nucleic acids are NOT DETECTED. The SARS-CoV-2 RNA is generally detectable in upper and lower  respiratory specimens during the acute phase of infection. The lowest  concentration of SARS-CoV-2 viral copies this assay can detect is 250  copies / mL. A negative result does not preclude SARS-CoV-2 infection  and should not be used as the sole basis for treatment or other  patient management decisions.  A negative result may occur with  improper specimen collection / handling, submission of specimen other  than nasopharyngeal swab, presence of viral mutation(s) within the  areas targeted by this assay, and inadequate number of viral copies  (<250 copies / mL). A negative result must be combined with clinical  observations, patient history, and epidemiological information. If result is POSITIVE SARS-CoV-2 target nucleic acids are DETECTED. The SARS-CoV-2 RNA is generally detectable in upper and lower  respiratory specimens dur ing the acute phase of infection.  Positive  results are indicative of active infection with SARS-CoV-2.  Clinical  correlation with patient history and other diagnostic information is  necessary to determine patient infection status.  Positive results do  not rule out bacterial infection or co-infection with other viruses. If result  is PRESUMPTIVE POSTIVE SARS-CoV-2 nucleic acids MAY BE PRESENT.   A presumptive positive result was obtained on the submitted specimen  and confirmed on repeat testing.  While 2019 novel coronavirus  (SARS-CoV-2) nucleic acids may be present in the submitted sample  additional confirmatory testing may be necessary for epidemiological  and / or clinical management purposes  to differentiate between  SARS-CoV-2 and other Sarbecovirus currently known to infect humans.  If clinically indicated additional testing with an alternate test  methodology [phone]) is advised. The SARS-CoV-2 RNA is generally  detectable in upper and lower respiratory sp ecimens during the acute  phase of infection. The expected result is Negative. Fact Sheet for Patients:  StrictlyIdeas.no Fact Sheet for Healthcare Providers: BankingDealers.co.za This test is not yet approved or cleared by the [place] FDA and has been authorized for detection and/or diagnosis of SARS-CoV-2 by FDA under an Emergency Use Authorization (EUA).  This EUA will remain in effect (meaning this test can be used) for the duration of the COVID-19 declaration under Section 564(b)(1) of the Act, 21 U.S.C. section 360bbb-3(b)(1), unless the authorization is terminated or revoked sooner. Performed at [facility], [address] [place], [place] [address]   MRSA PCR Screening     Status: None   Collection Time: 02/20/19  8:14 PM   Specimen: Nasopharyngeal  Result Value Ref Range Status   MRSA by PCR NEGATIVE NEGATIVE Final    Comment:        The GeneXpert MRSA Assay (FDA approved for NASAL specimens only), is one component of a comprehensive MRSA colonization surveillance program. It is not intended to diagnose MRSA infection nor to guide or monitor treatment for MRSA infections. Performed at [facility], [address] [place], [place] [address]      Coagulation Studies: Recent Labs    02/22/19 1636  LABPROT 13.1  INR 1.0    Urinalysis: No results for input(s): COLORURINE, LABSPEC, PHURINE, GLUCOSEU, HGBUR, BILIRUBINUR, KETONESUR, PROTEINUR, UROBILINOGEN, NITRITE, LEUKOCYTESUR in the last 72 hours.  Invalid input(s): APPERANCEUR    Imaging: [place] Rt Upper Extrem Ltd Soft Tissue Non Vascular  Result Date: 02/24/2019 CLINICAL DATA:  48 year old male with swelling at AV fistula site. Status post recent declot. EXAM: ULTRASOUND RIGHT UPPER EXTREMITY LIMITED TECHNIQUE: Ultrasound examination of the upper extremity soft tissues was performed in the area of clinical concern. COMPARISON:  None FINDINGS: Focal sonographic interrogation of the region of clinical concern demonstrates a metallic stent in the cephalic vein. The stent is completely occluded. A small amount of heterogeneous hypoechoic fluid is present surrounding the stented segment. The remainder of the cephalic vein is also thrombosed. IMPRESSION: 1. Recurrent thrombosis of AV fistula including the  stented segment of the cephalic vein. 2. There is a small amount of heterogeneous fluid surrounding the stented segment of the cephalic vein. This may represent hematoma related to recent intervention. However, infection could appear similar in the appropriate clinical setting. Electronically Signed   By: Jacqulynn Cadet M.D.   On: 02/24/2019 17:41     Medications:   . sodium chloride     . allopurinol  100 mg Oral Daily  . amLODipine  10 mg Oral Daily  . atenolol  50 mg Oral Daily  . calcium acetate  1,334 mg Oral TID WC  . Chlorhexidine Gluconate Cloth  6 each Topical Q0600  . insulin glargine  10 Units Subcutaneous BID  . metolazone  5 mg Oral Q M,W,F  . multivitamin with minerals  1 tablet Oral Daily  . omega-3 acid ethyl esters  1 g Oral Daily  . sodium chloride flush  3 mL Intravenous Q12H  . torsemide  100 mg Oral Once  . vitamin C with rose hips  1,000 mg Oral Daily    sodium chloride, acetaminophen **OR** acetaminophen, albuterol, heparin, lidocaine-prilocaine, Melatonin, ondansetron **OR** ondansetron (ZOFRAN) IV, ondansetron (ZOFRAN) IV, sodium chloride flush  Assessment/ Plan:  48 y.o.African American male with end-stage renal disease, diabetes, hypertension is admitted for management of hyperkalemia and access malfunction  Active Problems:   AVF (arteriovenous fistula) ([facility])   [facility]/ MWF-3/ 270 min/ 161.5 kg  #.  Complication of dialysis access, clotted AV fistula  - Dialysis via tunneled dialysis cathter -Resume outpatient dialysis starting tomorrow   #. Anemia of CKD  Lab Results  Component Value Date   HGB 10.1 (L) 02/25/2019  Will continue to monitor  #. SHPTH  No results found for: PTH Lab Results  Component Value Date   PHOS 7.2 (H) 02/22/2019  continue calcium acetate with meals  #. Diabetes type 2 with CKD Hgb A1c MFr Bld (%)  Date Value  02/23/2019 8.3 (H)   #Volume overload, with lower extremity edema Volume removal with HD as tolerated.    #Hypertension with CKD Continue home antihypertensive regimen  Right arm swelling  Appears to be hematoma We will hold Eliquis for now.  Follow-up as outpatient   LOS: [place] 6/28/20208:53 AM  [employer], [place]

## 2019-02-25 NOTE — Discharge Instructions (Signed)
Follow-up with primary care physician in 3 days Follow up with nephrology Dr. [name] in 4 to 5 days , continue hemodialysis on Monday Wednesday Friday.  Please follow-up on the right upper extremity hematoma Holding Eliquis Follow-up with vascular surgery in a week

## 2019-02-25 NOTE — Progress Notes (Signed)
Discharge instructions reviewed with the patient. Patient sent out via wheelchair to his car. He is driving himself home

## 2019-02-25 NOTE — Discharge Summary (Signed)
[facility] NAME: [name]    MR#:  [id number]  DATE OF BIRTH:  11-Nov-1970  DATE OF ADMISSION:  02/22/2019 ADMITTING PHYSICIAN: Loletha Grayer, MD  DATE OF DISCHARGE: 02/25/2019  PRIMARY CARE PHYSICIAN: Dayton Martes Victoriano Lain, NP    ADMISSION DIAGNOSIS:  Complication of Dialysis Access  DISCHARGE DIAGNOSIS:  Active Problems:   AVF (arteriovenous fistula) (HCC) hematoma RUE, Eliquis on hold   SECONDARY DIAGNOSIS:   Past Medical History:  Diagnosis Date  . Anxiety   . Chronic kidney disease    14% FUNCTION  . Depression   . Diabetes mellitus without complication ([place])   . Dyspnea    DOE  . Dysrhythmia   . History of orthopnea   . Hypertension   . Lymphedema of leg   . Microalbuminuria   . Sleep apnea    no CPAP  . Vitamin D deficiency     HOSPITAL COURSE:  HISTORY OF PRESENT ILLNESS: [name]  is a 48 y.o. male with a known history per below, discharged on yesterday for end-stage renal disease with hyperkalemia, returns today as a direct admission by request by nephrology/Dr. [name] for clotted AV fistula, patient now be admitted for acute clotted AV fistula.  1. Complication with AV fistula with small hematoma Patient had PermCath placement 6/26 and dialysis done on 02/23/2019 and 02/24/2019.  Patient tolerated the procedure well.   Ultrasound of the right upper extremity has revealed small hematoma versus infection but patient is afebrile and no leukocytosis.  Will hold Eliquis in light of small hematoma and if needed nephrology Dr. [name] will repeat ultrasound during the follow-up visit okay to discharge patient from nephrology standpoint  2. End-stage renal disease on hemodialysis Monday Wednesday Friday 3. Lower extremity edema and lymphedema.  Dialysis to help manage fluid. 4. Hypertension continue amlodipine, atenolol torsemide and metolazone. 5. History of gout on low-dose allopurinol 6. Hyperkalemia.   Given patiromer.    Patient had hemodialysis yesterday and will get dialysis today , dialysis will manage. 7. Type 2 diabetes.  So far sugars have been on the lower side.  hemoglobin A1c.8.3.  Patient needs tight control of the sugars.  Diabetic diet.  PCP to consider referring the patient endocrinology if needed DISCHARGE CONDITIONS:   stable  CONSULTS OBTAINED:  Treatment Team:  Murlean Iba, MD Algernon Huxley, MD   PROCEDURES PermCath placement on 02/23/2019  DRUG ALLERGIES:   Allergies  Allergen Reactions  . Lisinopril Swelling  . Adhesive [Tape] Itching  . Furosemide Other (See Comments)    Light headed, cough, blurry vision, weakness.    DISCHARGE MEDICATIONS:   Allergies as of 02/25/2019      Reactions   Lisinopril Swelling   Adhesive [tape] Itching   Furosemide Other (See Comments)   Light headed, cough, blurry vision, weakness.      Medication List    STOP taking these medications   apixaban 5 MG Tabs tablet Commonly known as: ELIQUIS     TAKE these medications   allopurinol 100 MG tablet Commonly known as: ZYLOPRIM Take 100 mg by mouth daily.   amLODipine 10 MG tablet Commonly known as: NORVASC Take 10 mg by mouth daily.   atenolol 50 MG tablet Commonly known as: TENORMIN Take 1 tablet (50 mg total) by mouth daily.   calcium acetate 667 MG capsule Commonly known as: PHOSLO Take 1,334 mg by mouth 3 (three) times daily with meals.   Cinnamon  500 MG capsule Take 500 mg by mouth daily.   EYE HEALTH PO Take 1 capsule by mouth daily.   Lantus SoloStar 100 UNIT/ML Solostar Pen Generic drug: Insulin Glargine Inject 10-20 Units into the skin 2 (two) times daily.   lidocaine-prilocaine cream Commonly known as: EMLA Apply 1 application topically as needed (for fistula).   Melatonin 5 MG Caps Take 5 mg by mouth at bedtime as needed (sleep).   metolazone 5 MG tablet Commonly known as: ZAROXOLYN Take 5 mg by mouth every Monday, Wednesday, and  Friday.   multivitamin with minerals Tabs tablet Take 1 tablet by mouth daily.   Omega-3 1000 MG Caps Take 1,000 mg by mouth daily.   omega-3 acid ethyl esters 1 g capsule Commonly known as: LOVAZA Take 1 capsule (1 g total) by mouth daily.   OVER THE COUNTER MEDICATION Take 1 tablet by mouth daily. Alpha King Supreme testosterone booster   OVER THE COUNTER MEDICATION Take 1 capsule by mouth daily. Nugenix testosterone booster   torsemide 20 MG tablet Commonly known as: DEMADEX Take 100 mg by mouth once.   vitamin C with rose hips 1000 MG tablet Take 1,000 mg by mouth daily.        DISCHARGE INSTRUCTIONS:   Follow-up with primary care physician in 3 days Follow up with nephrology Dr. [name] in 4 to 5 days , continue hemodialysis on Monday Wednesday Friday.  Please follow-up on the right upper extremity hematoma Holding Eliquis Follow-up with vascular surgery in a week     DIET:  Renal diet  DISCHARGE CONDITION:  Fair  ACTIVITY:  Activity as tolerated  OXYGEN:  Home Oxygen: No.   Oxygen Delivery: room air  DISCHARGE LOCATION:  home   If you experience worsening of your admission symptoms, develop shortness of breath, life threatening emergency, suicidal or homicidal thoughts you must seek medical attention immediately by calling 911 or calling your MD immediately  if symptoms less severe.  You Must read complete instructions/literature along with all the possible adverse reactions/side effects for all the Medicines you take and that have been prescribed to you. Take any new Medicines after you have completely understood and accpet all the possible adverse reactions/side effects.   Please note  You were cared for by a hospitalist during your hospital stay. If you have any questions about your discharge medications or the care you received while you were in the hospital after you are discharged, you can call the unit and asked to speak with the hospitalist on  call if the hospitalist that took care of you is not available. Once you are discharged, your primary care physician will handle any further medical issues. Please note that NO REFILLS for any discharge medications will be authorized once you are discharged, as it is imperative that you return to your primary care physician (or establish a relationship with a primary care physician if you do not have one) for your aftercare needs so that they can reassess your need for medications and monitor your lab values.     Today  No chief complaint on file.  Patient is resting comfortably.  Denies any complaints.  Patient had hemodialysis on Saturday tolerated the procedure well through the permacath.  Okay to discharge patient from nephrology standpoint  today  ROS:  CONSTITUTIONAL: Denies fevers, chills. Denies any fatigue, weakness.  EYES: Denies blurry vision, double vision, eye pain. EARS, NOSE, THROAT: Denies tinnitus, ear pain, hearing loss. RESPIRATORY: Denies cough, wheeze, shortness of  breath.  CARDIOVASCULAR: Denies chest pain, palpitations, edema.  GASTROINTESTINAL: Denies nausea, vomiting, diarrhea, abdominal pain. Denies bright red blood per rectum. GENITOURINARY: Denies dysuria, hematuria. ENDOCRINE: Denies nocturia or thyroid problems. HEMATOLOGIC AND LYMPHATIC: Denies easy bruising or bleeding. SKIN: Denies rash or lesion. MUSCULOSKELETAL: Denies pain in neck, back, shoulder, knees, hips or arthritic symptoms.  NEUROLOGIC: Denies paralysis, paresthesias.  PSYCHIATRIC: Denies anxiety or depressive symptoms.   VITAL SIGNS:  Blood pressure 136/67, pulse (!) 58, temperature 97.9 F (36.6 C), temperature source Oral, resp. rate 18, height 6\' 4"  (1.93 m), weight (!) 172 kg, SpO2 100 %.  I/O:    Intake/Output Summary (Last 24 hours) at 02/25/2019 1031 Last data filed at 02/24/2019 1812 Gross per 24 hour  Intake -  Output 3000 ml  Net -3000 ml    PHYSICAL EXAMINATION:  GENERAL:   48 y.o.-year-old patient lying in the bed with no acute distress.  EYES: Pupils equal, round, reactive to light and accommodation. No scleral icterus. Extraocular muscles intact.  HEENT: Head atraumatic, normocephalic. Oropharynx and nasopharynx clear.  NECK:  Supple, no jugular venous distention. No thyroid enlargement, no tenderness.  LUNGS: Normal breath sounds bilaterally, no wheezing, rales,rhonchi or crepitation. No use of accessory muscles of respiration.  CARDIOVASCULAR: S1, S2 normal. No murmurs, rubs, or gallops.  ABDOMEN: Soft, non-tender, non-distended. Bowel sounds present.  EXTREMITIES: Right upper extremity AV fistula site with small hematoma per ultrasound.  Right anterior chest wall with permacath placement no pedal edema, cyanosis, or clubbing.  NEUROLOGIC: Cranial nerves II through XII are intact. Muscle strength at his baseline in all extremities. Sensation intact. Gait not checked.  PSYCHIATRIC: The patient is alert and oriented x 3.  SKIN: No obvious rash, lesion, or ulcer.   DATA REVIEW:   CBC Recent Labs  Lab 02/25/19 0612  WBC 5.6  HGB 10.1*  HCT 32.1*  PLT 137*    Chemistries  Recent Labs  Lab 02/22/19 1636 02/23/19 0432  NA 138  138 139  K 5.6*  5.5* 5.3*  CL 100  101 102  CO2 23  23 22   GLUCOSE 121*  123* 112*  BUN 97*  106* 115*  CREATININE 15.60*  15.20* 16.09*  CALCIUM 8.2*  8.2* 8.1*  AST 13*  --   ALT 12  --   ALKPHOS 69  --   BILITOT 0.3  --     Cardiac Enzymes No results for input(s): TROPONINI in the last 168 hours.  Microbiology Results  Results for orders placed or performed during the hospital encounter of 02/20/19  MRSA PCR Screening     Status: None   Collection Time: 02/20/19  8:14 PM   Specimen: Nasopharyngeal  Result Value Ref Range Status   MRSA by PCR NEGATIVE NEGATIVE Final    Comment:        The GeneXpert MRSA Assay (FDA approved for NASAL specimens only), is one component of a comprehensive MRSA  colonization surveillance program. It is not intended to diagnose MRSA infection nor to guide or monitor treatment for MRSA infections. Performed at [facility], [address] [place], [place] [address]     RADIOLOGY:  [place] Rt Upper Extrem Ltd Soft Tissue Non Vascular  Result Date: 02/24/2019 CLINICAL DATA:  48 year old male with swelling at AV fistula site. Status post recent declot. EXAM: ULTRASOUND RIGHT UPPER EXTREMITY LIMITED TECHNIQUE: Ultrasound examination of the upper extremity soft tissues was performed in the area of clinical concern. COMPARISON:  None FINDINGS: Focal sonographic interrogation  of the region of clinical concern demonstrates a metallic stent in the cephalic vein. The stent is completely occluded. A small amount of heterogeneous hypoechoic fluid is present surrounding the stented segment. The remainder of the cephalic vein is also thrombosed. IMPRESSION: 1. Recurrent thrombosis of AV fistula including the stented segment of the cephalic vein. 2. There is a small amount of heterogeneous fluid surrounding the stented segment of the cephalic vein. This may represent hematoma related to recent intervention. However, infection could appear similar in the appropriate clinical setting. Electronically Signed   By: Jacqulynn Cadet M.D.   On: 02/24/2019 17:41    EKG:   Orders placed or performed during the hospital encounter of 07/14/18  . EKG test  . EKG test      Management plans discussed with the patient, he is in agreement.  CODE STATUS:     Code Status Orders  (From admission, onward)         Start     Ordered   02/22/19 1624  Full code  Continuous     02/22/19 1623        Code Status History    Date Active Date Inactive Code Status Order ID Comments User Context   02/20/2019 1413 02/22/2019 0114 Full Code [id number]  Sela Hua, MD Inpatient   10/01/2017 1321 10/19/2017 2017 Full Code [id number]  [name], MD Inpatient   Advance  Care Planning Activity    Advance Directive Documentation     Most Recent Value  Type of Advance Directive  Healthcare Power of Attorney  Pre-existing out of facility DNR order (yellow form or pink MOST form)  -  "MOST" Form in Place?  -      TOTAL TIME TAKING CARE OF THIS PATIENT: 45  minutes.   Note: This dictation was prepared with Dragon dictation along with smaller phrase technology. Any transcriptional errors that result from this process are unintentional.   @[facility] @  on 02/25/2019 at 10:31 AM  Between 7am to 6pm - Pager - [phone]  After 6pm go to [email or web address] - password [email or web address]  Office  [phone]  CC: Primary care physician; Sallee Lange, NP

## 2019-03-01 ENCOUNTER — Other Ambulatory Visit (INDEPENDENT_AMBULATORY_CARE_PROVIDER_SITE_OTHER): Payer: Self-pay | Admitting: Vascular Surgery

## 2019-03-01 DIAGNOSIS — N186 End stage renal disease: Secondary | ICD-10-CM

## 2019-03-05 ENCOUNTER — Ambulatory Visit (INDEPENDENT_AMBULATORY_CARE_PROVIDER_SITE_OTHER): Payer: Medicare Other

## 2019-03-05 ENCOUNTER — Encounter (INDEPENDENT_AMBULATORY_CARE_PROVIDER_SITE_OTHER): Payer: Self-pay | Admitting: Nurse Practitioner

## 2019-03-05 ENCOUNTER — Other Ambulatory Visit: Payer: Self-pay

## 2019-03-05 ENCOUNTER — Ambulatory Visit (INDEPENDENT_AMBULATORY_CARE_PROVIDER_SITE_OTHER): Payer: Medicare Other | Admitting: Nurse Practitioner

## 2019-03-05 VITALS — BP 175/84 | HR 65 | Resp 10 | Ht 76.0 in | Wt 375.0 lb

## 2019-03-05 DIAGNOSIS — I890 Lymphedema, not elsewhere classified: Secondary | ICD-10-CM | POA: Diagnosis not present

## 2019-03-05 DIAGNOSIS — E119 Type 2 diabetes mellitus without complications: Secondary | ICD-10-CM

## 2019-03-05 DIAGNOSIS — N186 End stage renal disease: Secondary | ICD-10-CM

## 2019-03-05 DIAGNOSIS — E1122 Type 2 diabetes mellitus with diabetic chronic kidney disease: Secondary | ICD-10-CM

## 2019-03-05 DIAGNOSIS — Z992 Dependence on renal dialysis: Secondary | ICD-10-CM

## 2019-03-05 DIAGNOSIS — Z794 Long term (current) use of insulin: Secondary | ICD-10-CM

## 2019-03-05 DIAGNOSIS — Z87891 Personal history of nicotine dependence: Secondary | ICD-10-CM

## 2019-03-05 NOTE — Progress Notes (Signed)
SUBJECTIVE:  Patient ID: [name], male    DOB: 04-Jul-1971, 48 y.o.   MRN: [id number] Chief Complaint  Patient presents with  . Follow-up    HPI  [name] is a 48 y.o. male presents today for left upper extremity vein mapping.  The patient previously had a [place] ulnar ulnar fistula created on 07/19/2018.  However, after multiple interventions the fistula has completely clotted off.  This was confirmed by noninvasive studies today.  There was also no thrill or bruit present.  The patient currently has a right PermCath for dialysis access.  Patient denies any fever, chills, nausea, vomiting or diarrhea.  He denies any chest pain shortness of breath.  He denies any TIA-like symptoms.  Today noninvasive studies show adequate veins for creation of a left radiocephalic AV fistula  Past Medical History:  Diagnosis Date  . Anxiety   . Chronic kidney disease    14% FUNCTION  . Depression   . Diabetes mellitus without complication ([place])   . Dyspnea    DOE  . Dysrhythmia   . History of orthopnea   . Hypertension   . Lymphedema of leg   . Microalbuminuria   . Sleep apnea    no CPAP  . Vitamin D deficiency     Past Surgical History:  Procedure Laterality Date  . A/V FISTULAGRAM Right 10/24/2018   Procedure: A/V FISTULAGRAM;  Surgeon: Katha Cabal, MD;  Location: [facility];  Service: Cardiovascular;  Laterality: Right;  . A/V FISTULAGRAM Right 11/24/2018   Procedure: A/V FISTULAGRAM;  Surgeon: Katha Cabal, MD;  Location: [facility];  Service: Cardiovascular;  Laterality: Right;  . A/V SHUNT INTERVENTION Right 02/21/2019   Procedure: A/V SHUNT INTERVENTION;  Surgeon: Katha Cabal, MD;  Location: [facility];  Service: Cardiovascular;  Laterality: Right;  . APPLICATION OF WOUND VAC Left 10/03/2017   Procedure: APPLICATION OF WOUND VAC;  Surgeon: Algernon Huxley, MD;  Location: [facility];  Service: General;  Laterality: Left;  .  APPLICATION OF WOUND VAC Left 10/11/2017   Procedure: APPLICATION OF WOUND VAC;  Surgeon: Katha Cabal, MD;  Location: [facility];  Service: Vascular;  Laterality: Left;  . APPLICATION OF WOUND VAC Left 10/14/2017   Procedure: WOUND VAC CHANGE;  Surgeon: Katha Cabal, MD;  Location: [facility];  Service: Vascular;  Laterality: Left;  left lower leg  . APPLICATION OF WOUND VAC Left 10/18/2017   Procedure: WOUND VAC CHANGE;  Surgeon: Katha Cabal, MD;  Location: [facility];  Service: Vascular;  Laterality: Left;  . APPLICATION OF WOUND VAC Left 10/07/2017   Procedure: APPLICATION OF WOUND VAC;  Surgeon: Katha Cabal, MD;  Location: [facility];  Service: Vascular;  Laterality: Left;  . AV FISTULA INSERTION W/ RF MAGNETIC GUIDANCE Right 07/19/2018   Procedure: AV FISTULA INSERTION W/RF MAGNETIC GUIDANCE;  Surgeon: Katha Cabal, MD;  Location: [facility];  Service: Cardiovascular;  Laterality: Right;  . CATARACT EXTRACTION W/PHACO Left 11/01/2018   Procedure: CATARACT EXTRACTION PHACO AND INTRAOCULAR LENS PLACEMENT (IOC)-LEFT, DIABETIC-INSULIN DEPENDENT;  Surgeon: Eulogio Bear, MD;  Location: [facility];  Service: Ophthalmology;  Laterality: Left;  [place] 00:41.8 CDE 4.61 Fluid Pack Lot # [id number]  . DIALYSIS/PERMA CATHETER INSERTION N/A 02/23/2019   Procedure: DIALYSIS/PERMA CATHETER INSERTION;  Surgeon: Algernon Huxley, MD;  Location: [place] [facility];  Service: Cardiovascular;  Laterality: N/A;  . I&D EXTREMITY Left 10/11/2017  Procedure: IRRIGATION AND DEBRIDEMENT EXTREMITY;  Surgeon: Katha Cabal, MD;  Location: [facility];  Service: Vascular;  Laterality: Left;  . INCISION AND DRAINAGE ABSCESS Right 10/07/2017   Procedure: INCISION AND DRAINAGE ABSCESS;  Surgeon: Katha Cabal, MD;  Location: [facility];  Service: Vascular;  Laterality: Right;  . IRRIGATION AND DEBRIDEMENT ABSCESS Left 10/03/2017   Procedure: IRRIGATION AND DEBRIDEMENT ABSCESS with debridement of  skin, soft tissue, muscle 50sq cm;  Surgeon: Algernon Huxley, MD;  Location: [facility];  Service: General;  Laterality: Left;  . TEMPORARY DIALYSIS CATHETER  02/20/2019   Procedure: TEMPORARY DIALYSIS CATHETER;  Surgeon: Katha Cabal, MD;  Location: [facility];  Service: Cardiovascular;;    Social History   Socioeconomic History  . Marital status: Divorced    Spouse name: Not on file  . Number of children: Not on file  . Years of education: Not on file  . Highest education level: Not on file  Occupational History  . Not on file  Social Needs  . Financial resource strain: Not on file  . Food insecurity    Worry: Not on file    Inability: Not on file  . Transportation needs    Medical: Not on file    Non-medical: Not on file  Tobacco Use  . Smoking status: Former Smoker    Types: Cigars  . Smokeless tobacco: Former Systems developer    Quit date: 09/30/2017  . Tobacco comment: occasional  Substance and Sexual Activity  . Alcohol use: Yes    Alcohol/week: 1.0 standard drinks    Types: 1 Cans of beer per week    Comment: beer  . Drug use: Not Currently  . Sexual activity: Not on file  Lifestyle  . Physical activity    Days per week: Not on file    Minutes per session: Not on file  . Stress: Not on file  Relationships  . Social Herbalist on phone: Not on file    Gets together: Not on file    Attends religious service: Not on file    Active member of club or organization: Not on file    Attends meetings of clubs or organizations: Not on file    Relationship status: Not on file  . Intimate partner violence    Fear of current or ex partner: Not on file    Emotionally abused: Not on file    Physically abused: Not on file    Forced sexual activity: Not on file  Other Topics Concern  . Not on file  Social History Narrative  . Not on file    Family History  Problem Relation Age of Onset  . Diabetes Father   . Kidney disease Father   . Diabetes Daughter      Allergies  Allergen Reactions  . Lisinopril Swelling  . Adhesive [Tape] Itching  . Furosemide Other (See Comments)    Light headed, cough, blurry vision, weakness.     Review of Systems   Review of Systems: Negative Unless Checked Constitutional: [] Weight loss  [] Fever  [] Chills Cardiac: [] Chest pain   []  Atrial Fibrillation  [] Palpitations   [] Shortness of breath when laying flat   [] Shortness of breath with exertion. [] Shortness of breath at rest Vascular:  [] Pain in legs with walking   [] Pain in legs with standing [] Pain in legs when laying flat   [] Claudication    [] Pain in feet when laying flat    [] History of  DVT   [] Phlebitis   [x] Swelling in legs   [] Varicose veins   [] Non-healing ulcers Pulmonary:   [] Uses home oxygen   [] Productive cough   [] Hemoptysis   [] Wheeze  [] COPD   [] Asthma Neurologic:  [] Dizziness   [] Seizures  [] Blackouts [] History of stroke   [] History of TIA  [] Aphasia   [] Temporary Blindness   [] Weakness or numbness in arm   [] Weakness or numbness in leg Musculoskeletal:   [] Joint swelling   [] Joint pain   [] Low back pain  []  History of Knee Replacement [] Arthritis [] back Surgeries  []  Spinal Stenosis    Hematologic:  [] Easy bruising  [] Easy bleeding   [] Hypercoagulable state   [x] Anemic Gastrointestinal:  [] Diarrhea   [] Vomiting  [] Gastroesophageal reflux/heartburn   [] Difficulty swallowing. [] Abdominal pain Genitourinary:  [x] Chronic kidney disease   [] Difficult urination  [] Anuric   [] Blood in urine [] Frequent urination  [] Burning with urination   [] Hematuria Skin:  [] Rashes   [] Ulcers [] Wounds Psychological:  [x] History of anxiety   [x]  History of major depression  []  Memory Difficulties      OBJECTIVE:   Physical Exam  BP (!) 175/84 (BP Location: Left Wrist, Patient Position: Sitting, Cuff Size: Large)   Pulse 65   Resp 10   Ht 6\' 4"  (1.93 m)   Wt (!) 375 lb (170.1 kg)   BMI 45.65 kg/m   Gen: WD/WN, NAD Head: [place]/AT, No temporalis wasting.   Ear/Nose/Throat: Hearing grossly intact, nares w/o erythema or drainage Eyes: PER, EOMI, sclera nonicteric.  Neck: Supple, no masses.  No JVD.  Pulmonary:  Good air movement, no use of accessory muscles.  Cardiac: RRR Vascular:  Vessel Right Left  Radial Palpable Palpable  Brachial Palpable Palpable  Femoral Palpable Palpable  Popliteal Palpable Palpable  Dorsalis Pedis Palpable Palpable  Posterior Tibial Palpable Palpable   Gastrointestinal: soft, non-distended. No guarding/no peritoneal signs.  Musculoskeletal: M/S 5/5 throughout.  No deformity or atrophy.  Neurologic: Pain and light touch intact in extremities.  Symmetrical.  Speech is fluent. Motor exam as listed above. Psychiatric: Judgment intact, Mood & affect appropriate for pt's clinical situation. Dermatologic: No Venous rashes. No Ulcers Noted.  No changes consistent with cellulitis. Lymph : No Cervical lymphadenopathy, no lichenification or skin changes of chronic lymphedema.       ASSESSMENT AND PLAN:  1. End stage renal disease ([place]) After thorough discussion with the patient it was recommended that we proceed with fistula creation of his left upper extremity.  However, at this time the patient does not wish to proceed.  The patient wishes for further intervention to be done however given the fact that it is clotted off with multiple stent placement it is unlikely that this would be possible.  At this time the patient wishes to examine further options by getting a second opinion.  The patient states that he will speak with his nephrologist in order to facilitate subsequent referral.  Patient is advised that if he obtains a second opinion and wishes to decide to proceed with left radiocephalic AV fistula placement he can contact her office in order to be scheduled for this surgery.  2. Diabetes mellitus without complication ([place]) Continue hypoglycemic medications as already ordered, these medications have been reviewed  and there are no changes at this time.  Hgb A1C to be monitored as already arranged by primary service   3. Lymphedema No surgery or intervention at this point in time.  I have reviewed my discussion with the patient regarding venous insufficiency  and why it causes symptoms. I have discussed with the patient the chronic skin changes that accompany venous insufficiency and the long term sequela such as ulceration. Patient will contnue wearing graduated compression stockings on a daily basis, as this has provided excellent control of his edema. The patient will put the stockings on first thing in the morning and removing them in the evening. The patient is reminded not to sleep in the stockings.  In addition, behavioral modification including elevation during the day will be initiated. Exercise is strongly encouraged.    Current Outpatient Medications on File Prior to Visit  Medication Sig Dispense Refill  . allopurinol (ZYLOPRIM) 100 MG tablet Take 100 mg by mouth daily.  6  . amLODipine (NORVASC) 10 MG tablet Take 10 mg by mouth daily.     . Ascorbic Acid (VITAMIN C WITH ROSE HIPS) 1000 MG tablet Take 1,000 mg by mouth daily.    [name] atenolol (TENORMIN) 50 MG tablet Take 1 tablet (50 mg total) by mouth daily. 30 tablet 0  . calcium acetate (PHOSLO) 667 MG capsule Take 1,334 mg by mouth 3 (three) times daily with meals.    . Cinnamon 500 MG capsule Take 500 mg by mouth daily.    . Insulin Glargine (LANTUS SOLOSTAR) 100 UNIT/ML Solostar Pen Inject 10-20 Units into the skin 2 (two) times daily.     [name] lidocaine-prilocaine (EMLA) cream Apply 1 application topically as needed (for fistula).    . Melatonin 5 MG CAPS Take 5 mg by mouth at bedtime as needed (sleep).    . metolazone (ZAROXOLYN) 5 MG tablet Take 5 mg by mouth every Monday, Wednesday, and Friday.     . Multiple Vitamin (MULTIVITAMIN WITH MINERALS) TABS tablet Take 1 tablet by mouth daily.     . Multiple Vitamins-Minerals (EYE HEALTH PO)  Take 1 capsule by mouth daily.     . Omega-3 1000 MG CAPS Take 1,000 mg by mouth daily.    [name] OVER THE COUNTER MEDICATION Take 1 tablet by mouth daily. [employer] testosterone booster    . OVER THE COUNTER MEDICATION Take 1 capsule by mouth daily. Nugenix testosterone booster    . torsemide (DEMADEX) 20 MG tablet Take 100 mg by mouth once.   11  . omega-3 acid ethyl esters (LOVAZA) 1 g capsule Take 1 capsule (1 g total) by mouth daily. (Patient not taking: Reported on 03/05/2019) 30 capsule 0   No current facility-administered medications on file prior to visit.     There are no Patient Instructions on file for this visit. No follow-ups on file.   Kris Hartmann, NP  This note was completed with Sales executive.  Any errors are purely unintentional.

## 2019-03-20 ENCOUNTER — Other Ambulatory Visit: Payer: Self-pay | Admitting: Internal Medicine

## 2019-04-10 ENCOUNTER — Telehealth (INDEPENDENT_AMBULATORY_CARE_PROVIDER_SITE_OTHER): Payer: Self-pay | Admitting: Vascular Surgery

## 2019-04-10 NOTE — Telephone Encounter (Signed)
Patient needs an appt with Arna Medici to discuss a new access. Thank you.

## 2019-04-12 ENCOUNTER — Ambulatory Visit (INDEPENDENT_AMBULATORY_CARE_PROVIDER_SITE_OTHER): Payer: Medicare Other | Admitting: Nurse Practitioner

## 2019-04-12 ENCOUNTER — Encounter (INDEPENDENT_AMBULATORY_CARE_PROVIDER_SITE_OTHER): Payer: Self-pay | Admitting: Nurse Practitioner

## 2019-04-12 ENCOUNTER — Other Ambulatory Visit: Payer: Self-pay

## 2019-04-12 VITALS — BP 188/113 | HR 92 | Resp 12 | Ht 76.0 in | Wt 368.0 lb

## 2019-04-12 DIAGNOSIS — E11319 Type 2 diabetes mellitus with unspecified diabetic retinopathy without macular edema: Secondary | ICD-10-CM | POA: Insufficient documentation

## 2019-04-12 DIAGNOSIS — I10 Essential (primary) hypertension: Secondary | ICD-10-CM | POA: Diagnosis not present

## 2019-04-12 DIAGNOSIS — E11311 Type 2 diabetes mellitus with unspecified diabetic retinopathy with macular edema: Secondary | ICD-10-CM | POA: Insufficient documentation

## 2019-04-12 DIAGNOSIS — I890 Lymphedema, not elsewhere classified: Secondary | ICD-10-CM

## 2019-04-12 DIAGNOSIS — N186 End stage renal disease: Secondary | ICD-10-CM

## 2019-04-12 DIAGNOSIS — Z6841 Body Mass Index (BMI) 40.0 and over, adult: Secondary | ICD-10-CM | POA: Insufficient documentation

## 2019-04-12 DIAGNOSIS — E119 Type 2 diabetes mellitus without complications: Secondary | ICD-10-CM | POA: Diagnosis not present

## 2019-04-12 DIAGNOSIS — E66813 Obesity, class 3: Secondary | ICD-10-CM | POA: Insufficient documentation

## 2019-04-16 ENCOUNTER — Encounter (INDEPENDENT_AMBULATORY_CARE_PROVIDER_SITE_OTHER): Payer: Self-pay | Admitting: Nurse Practitioner

## 2019-04-16 NOTE — Progress Notes (Signed)
SUBJECTIVE:  Patient ID: [name], male    DOB: May 22, 1971, 48 y.o.   MRN: [id number] Chief Complaint  Patient presents with  . Follow-up    HPI  [name] is a 48 y.o. male the presents today for discussion about his hemodialysis access.  The patient currently has a right ulnar ulnar wave Linq that has thrombosed and is not usable.  Previous noninvasive studies done by our office show adequate vein creation for a left radiocephalic AV fistula however at that time the patient was not ready to proceed with fistula creation and sought a second opinion.  Today the patient is insistent upon keeping access with in his right upper extremity due to the fact that he is left-hand dominant.  The left radiocephalic AV fistula is still a viable option.  The patient also has adequate access for a right upper extremity loop graft as well as a brachial axillary graft.  Past Medical History:  Diagnosis Date  . Anxiety   . Chronic kidney disease    14% FUNCTION  . Depression   . Diabetes mellitus without complication ([place])   . Dyspnea    DOE  . Dysrhythmia   . History of orthopnea   . Hypertension   . Lymphedema of leg   . Microalbuminuria   . Sleep apnea    no CPAP  . Vitamin D deficiency     Past Surgical History:  Procedure Laterality Date  . A/V FISTULAGRAM Right 10/24/2018   Procedure: A/V FISTULAGRAM;  Surgeon: Katha Cabal, MD;  Location: [facility];  Service: Cardiovascular;  Laterality: Right;  . A/V FISTULAGRAM Right 11/24/2018   Procedure: A/V FISTULAGRAM;  Surgeon: Katha Cabal, MD;  Location: [facility];  Service: Cardiovascular;  Laterality: Right;  . A/V SHUNT INTERVENTION Right 02/21/2019   Procedure: A/V SHUNT INTERVENTION;  Surgeon: Katha Cabal, MD;  Location: [facility];  Service: Cardiovascular;  Laterality: Right;  . APPLICATION OF WOUND VAC Left 10/03/2017   Procedure: APPLICATION OF WOUND VAC;  Surgeon: Algernon Huxley,  MD;  Location: [facility];  Service: General;  Laterality: Left;  . APPLICATION OF WOUND VAC Left 10/11/2017   Procedure: APPLICATION OF WOUND VAC;  Surgeon: Katha Cabal, MD;  Location: [facility];  Service: Vascular;  Laterality: Left;  . APPLICATION OF WOUND VAC Left 10/14/2017   Procedure: WOUND VAC CHANGE;  Surgeon: Katha Cabal, MD;  Location: [facility];  Service: Vascular;  Laterality: Left;  left lower leg  . APPLICATION OF WOUND VAC Left 10/18/2017   Procedure: WOUND VAC CHANGE;  Surgeon: Katha Cabal, MD;  Location: [facility];  Service: Vascular;  Laterality: Left;  . APPLICATION OF WOUND VAC Left 10/07/2017   Procedure: APPLICATION OF WOUND VAC;  Surgeon: Katha Cabal, MD;  Location: [facility];  Service: Vascular;  Laterality: Left;  . AV FISTULA INSERTION W/ RF MAGNETIC GUIDANCE Right 07/19/2018   Procedure: AV FISTULA INSERTION W/RF MAGNETIC GUIDANCE;  Surgeon: Katha Cabal, MD;  Location: [facility];  Service: Cardiovascular;  Laterality: Right;  . CATARACT EXTRACTION W/PHACO Left 11/01/2018   Procedure: CATARACT EXTRACTION PHACO AND INTRAOCULAR LENS PLACEMENT (IOC)-LEFT, DIABETIC-INSULIN DEPENDENT;  Surgeon: Eulogio Bear, MD;  Location: [facility];  Service: Ophthalmology;  Laterality: Left;  [place] 00:41.8 CDE 4.61 Fluid Pack Lot # [id number]  . DIALYSIS/PERMA CATHETER INSERTION N/A 02/23/2019   Procedure: DIALYSIS/PERMA CATHETER INSERTION;  Surgeon: Algernon Huxley,  MD;  Location: [facility];  Service: Cardiovascular;  Laterality: N/A;  . I&D EXTREMITY Left 10/11/2017   Procedure: IRRIGATION AND DEBRIDEMENT EXTREMITY;  Surgeon: Katha Cabal, MD;  Location: [facility];  Service: Vascular;  Laterality: Left;  . INCISION AND DRAINAGE ABSCESS Right 10/07/2017   Procedure: INCISION AND DRAINAGE ABSCESS;  Surgeon: Katha Cabal, MD;  Location: [facility];  Service: Vascular;  Laterality: Right;  . IRRIGATION AND DEBRIDEMENT ABSCESS Left 10/03/2017    Procedure: IRRIGATION AND DEBRIDEMENT ABSCESS with debridement of skin, soft tissue, muscle 50sq cm;  Surgeon: Algernon Huxley, MD;  Location: [facility];  Service: General;  Laterality: Left;  . TEMPORARY DIALYSIS CATHETER  02/20/2019   Procedure: TEMPORARY DIALYSIS CATHETER;  Surgeon: Katha Cabal, MD;  Location: [facility];  Service: Cardiovascular;;    Social History   Socioeconomic History  . Marital status: Divorced    Spouse name: Not on file  . Number of children: Not on file  . Years of education: Not on file  . Highest education level: Not on file  Occupational History  . Not on file  Social Needs  . Financial resource strain: Not on file  . Food insecurity    Worry: Not on file    Inability: Not on file  . Transportation needs    Medical: Not on file    Non-medical: Not on file  Tobacco Use  . Smoking status: Former Smoker    Types: Cigars  . Smokeless tobacco: Former Systems developer    Quit date: 09/30/2017  . Tobacco comment: occasional  Substance and Sexual Activity  . Alcohol use: Yes    Alcohol/week: 1.0 standard drinks    Types: 1 Cans of beer per week    Comment: beer  . Drug use: Not Currently  . Sexual activity: Not on file  Lifestyle  . Physical activity    Days per week: Not on file    Minutes per session: Not on file  . Stress: Not on file  Relationships  . Social Herbalist on phone: Not on file    Gets together: Not on file    Attends religious service: Not on file    Active member of club or organization: Not on file    Attends meetings of clubs or organizations: Not on file    Relationship status: Not on file  . Intimate partner violence    Fear of current or ex partner: Not on file    Emotionally abused: Not on file    Physically abused: Not on file    Forced sexual activity: Not on file  Other Topics Concern  . Not on file  Social History Narrative  . Not on file    Family History  Problem Relation Age of Onset  .  Diabetes Father   . Kidney disease Father   . Diabetes Daughter     Allergies  Allergen Reactions  . Lisinopril Swelling  . Adhesive [Tape] Itching  . Furosemide Other (See Comments)    Light headed, cough, blurry vision, weakness.     Review of Systems   Review of Systems: Negative Unless Checked Constitutional: [] Weight loss  [] Fever  [] Chills Cardiac: [] Chest pain   []  Atrial Fibrillation  [] Palpitations   [] Shortness of breath when laying flat   [x] Shortness of breath with exertion. [] Shortness of breath at rest Vascular:  [] Pain in legs with walking   [] Pain in legs with standing [] Pain in  legs when laying flat   [] Claudication    [] Pain in feet when laying flat    [] History of DVT   [] Phlebitis   [x] Swelling in legs   [] Varicose veins   [] Non-healing ulcers Pulmonary:   [] Uses home oxygen   [] Productive cough   [] Hemoptysis   [] Wheeze  [] COPD   [] Asthma Neurologic:  [] Dizziness   [] Seizures  [] Blackouts [] History of stroke   [] History of TIA  [] Aphasia   [] Temporary Blindness   [] Weakness or numbness in arm   [] Weakness or numbness in leg Musculoskeletal:   [] Joint swelling   [] Joint pain   [] Low back pain  []  History of Knee Replacement [] Arthritis [] back Surgeries  []  Spinal Stenosis    Hematologic:  [] Easy bruising  [] Easy bleeding   [] Hypercoagulable state   [x] Anemic Gastrointestinal:  [] Diarrhea   [] Vomiting  [] Gastroesophageal reflux/heartburn   [] Difficulty swallowing. [] Abdominal pain Genitourinary:  [x] Chronic kidney disease   [] Difficult urination  [] Anuric   [] Blood in urine [] Frequent urination  [] Burning with urination   [] Hematuria Skin:  [] Rashes   [] Ulcers [] Wounds Psychological:  [] History of anxiety   []  History of major depression  []  Memory Difficulties      OBJECTIVE:   Physical Exam  BP (!) 188/113 (BP Location: Left Wrist, Patient Position: Sitting, Cuff Size: Large)   Pulse 92   Resp 12   Ht 6\' 4"  (1.93 m)   Wt (!) 368 lb (166.9 kg)   BMI 44.79  kg/m   Gen: WD/WN, NAD Head: [place]/AT, No temporalis wasting.  Ear/Nose/Throat: Hearing grossly intact, nares w/o erythema or drainage Eyes: PER, EOMI, sclera nonicteric.  Neck: Supple, no masses.  No JVD.  Pulmonary:  Good air movement, no use of accessory muscles.  Cardiac: RRR Vascular: 2+ edema bilaterally Vessel Right Left  Radial Palpable Palpable   Gastrointestinal: soft, non-distended. No guarding/no peritoneal signs.  Musculoskeletal: M/S 5/5 throughout.  No deformity or atrophy.  Neurologic: Pain and light touch intact in extremities.  Symmetrical.  Speech is fluent. Motor exam as listed above. Psychiatric: Judgment intact, Mood & affect appropriate for pt's clinical situation. Dermatologic: No Venous rashes. No Ulcers Noted.  No changes consistent with cellulitis. Lymph : No Cervical lymphadenopathy, no lichenification or skin changes of chronic lymphedema.       ASSESSMENT AND PLAN:  1. End stage renal disease (HCC) Currently, the patient wishes to keep access in his right upper extremity.  Based on the patient's body habitus I feel that a right loop graft would work best as well as for access at the dialysis centers.  Keeping in access in his right upper extremity is possible however the current fistula in his right upper extremity may necessitate multiple surgeries and/or procedures.  I have discussed this extensively with the patient, and he wishes to consider his options at this time.  He will contact our office once he has made a decision or if he has additional questions.  2. Lymphedema Patient's lymphedema is doing much better.  The patient will continue to wear medical grade 1 compression stockings on a daily basis.  3. Diabetes mellitus without complication ([place]) Continue hypoglycemic medications as already ordered, these medications have been reviewed and there are no changes at this time.  Hgb A1C to be monitored as already arranged by primary service   4.  Essential hypertension Continue antihypertensive medications as already ordered, these medications have been reviewed and there are no changes at this time.    Current Outpatient Medications  on File Prior to Visit  Medication Sig Dispense Refill  . apixaban (ELIQUIS) 5 MG TABS tablet Take by mouth.    . calcium acetate (PHOSLO) 667 MG capsule Take 1,334 mg by mouth 3 (three) times daily with meals.    . Cinnamon 500 MG capsule Take 500 mg by mouth daily.    . Insulin Glargine (LANTUS SOLOSTAR) 100 UNIT/ML Solostar Pen Inject 10-20 Units into the skin 2 (two) times daily.     . Melatonin 5 MG CAPS Take 5 mg by mouth at bedtime as needed (sleep).    . metolazone (ZAROXOLYN) 5 MG tablet Take 5 mg by mouth every Monday, Wednesday, and Friday.     . Multiple Vitamin (MULTIVITAMIN WITH MINERALS) TABS tablet Take 1 tablet by mouth daily.     . Omega-3 1000 MG CAPS Take 1,000 mg by mouth daily.    [name] torsemide (DEMADEX) 20 MG tablet Take 100 mg by mouth once.   11  . allopurinol (ZYLOPRIM) 100 MG tablet Take 100 mg by mouth daily.  6  . amLODipine (NORVASC) 10 MG tablet Take 10 mg by mouth daily.     . Ascorbic Acid (VITAMIN C WITH ROSE HIPS) 1000 MG tablet Take 1,000 mg by mouth daily.    [name] atenolol (TENORMIN) 50 MG tablet Take 1 tablet (50 mg total) by mouth daily. (Patient not taking: Reported on 04/12/2019) 30 tablet 0  . lidocaine-prilocaine (EMLA) cream Apply 1 application topically as needed (for fistula).    . Multiple Vitamins-Minerals (EYE HEALTH PO) Take 1 capsule by mouth daily.     [name] omega-3 acid ethyl esters (LOVAZA) 1 g capsule Take 1 capsule (1 g total) by mouth daily. (Patient not taking: Reported on 03/05/2019) 30 capsule 0  . OVER THE COUNTER MEDICATION Take 1 tablet by mouth daily. [employer] testosterone booster    . OVER THE COUNTER MEDICATION Take 1 capsule by mouth daily. Nugenix testosterone booster     No current facility-administered medications on file prior to  visit.     There are no Patient Instructions on file for this visit. No follow-ups on file.   Kris Hartmann, NP  This note was completed with Sales executive.  Any errors are purely unintentional.

## 2019-04-19 ENCOUNTER — Telehealth (INDEPENDENT_AMBULATORY_CARE_PROVIDER_SITE_OTHER): Payer: Self-pay

## 2019-04-19 NOTE — Telephone Encounter (Signed)
Celeste from [employer] called stating the patient has decided to have surgery. I explained to [place] that I would have to speak with Eulogio Ditch regarding this, due to my not having any knowledge of a surgery. Celeste requested that after getting the information if I could fax the information to her attention at the dialysis center. Per Eulogio Ditch she will need to speak with and get more information from the patient regarding which arm and what type of access. Once this has been discussed I will schedule the patient for surgery.

## 2019-04-24 ENCOUNTER — Other Ambulatory Visit: Payer: Self-pay

## 2019-04-24 ENCOUNTER — Telehealth (INDEPENDENT_AMBULATORY_CARE_PROVIDER_SITE_OTHER): Payer: Self-pay | Admitting: Vascular Surgery

## 2019-04-24 ENCOUNTER — Other Ambulatory Visit
Admission: RE | Admit: 2019-04-24 | Discharge: 2019-04-24 | Disposition: A | Discharge status: Home / Self Care | Payer: Medicare Other | Source: Ambulatory Visit | Attending: Ophthalmology | Admitting: Ophthalmology

## 2019-04-24 DIAGNOSIS — Z20828 Contact with and (suspected) exposure to other viral communicable diseases: Secondary | ICD-10-CM | POA: Diagnosis not present

## 2019-04-24 DIAGNOSIS — Z01812 Encounter for preprocedural laboratory examination: Secondary | ICD-10-CM | POA: Insufficient documentation

## 2019-04-24 LAB — SARS CORONAVIRUS 2 (TAT 6-24 HRS): SARS Coronavirus 2: NEGATIVE

## 2019-04-24 NOTE — Telephone Encounter (Signed)
This is the patient I asked you about earlier. Please advise? AS, CMA

## 2019-04-25 ENCOUNTER — Telehealth (INDEPENDENT_AMBULATORY_CARE_PROVIDER_SITE_OTHER): Payer: Self-pay

## 2019-04-25 ENCOUNTER — Encounter (INDEPENDENT_AMBULATORY_CARE_PROVIDER_SITE_OTHER): Payer: Self-pay

## 2019-04-25 NOTE — Telephone Encounter (Signed)
Patient will be scheduled for left radiocephalic AVF per Eulogio Ditch NP.

## 2019-04-25 NOTE — Telephone Encounter (Signed)
Spoke with the patient and let him know he is scheduled with Dr. Delana Meyer for his surgery on 05/11/2019 and will do his Covid testing and pre-op on 05/08/2019 at 11:15 am at the [facility]. Surgical information will be mailed to the patient as well as faxed to his dialysis center in [place] attention [place].

## 2019-04-27 ENCOUNTER — Encounter
Admission: RE | Disposition: A | Discharge status: Home / Self Care | Payer: Self-pay | Source: Home / Self Care | Attending: Ophthalmology

## 2019-04-27 ENCOUNTER — Ambulatory Visit: Payer: Medicare Other | Admitting: Anesthesiology

## 2019-04-27 ENCOUNTER — Encounter: Payer: Self-pay | Admitting: *Deleted

## 2019-04-27 ENCOUNTER — Ambulatory Visit
Admission: RE | Admit: 2019-04-27 | Discharge: 2019-04-27 | Disposition: A | Discharge status: Home / Self Care | Payer: Medicare Other | Attending: Ophthalmology | Admitting: Ophthalmology

## 2019-04-27 DIAGNOSIS — E1151 Type 2 diabetes mellitus with diabetic peripheral angiopathy without gangrene: Secondary | ICD-10-CM | POA: Insufficient documentation

## 2019-04-27 DIAGNOSIS — Z87891 Personal history of nicotine dependence: Secondary | ICD-10-CM | POA: Diagnosis not present

## 2019-04-27 DIAGNOSIS — Z79899 Other long term (current) drug therapy: Secondary | ICD-10-CM | POA: Insufficient documentation

## 2019-04-27 DIAGNOSIS — Z794 Long term (current) use of insulin: Secondary | ICD-10-CM | POA: Diagnosis not present

## 2019-04-27 DIAGNOSIS — N189 Chronic kidney disease, unspecified: Secondary | ICD-10-CM | POA: Insufficient documentation

## 2019-04-27 DIAGNOSIS — I129 Hypertensive chronic kidney disease with stage 1 through stage 4 chronic kidney disease, or unspecified chronic kidney disease: Secondary | ICD-10-CM | POA: Diagnosis not present

## 2019-04-27 DIAGNOSIS — G4730 Sleep apnea, unspecified: Secondary | ICD-10-CM | POA: Insufficient documentation

## 2019-04-27 DIAGNOSIS — Z7901 Long term (current) use of anticoagulants: Secondary | ICD-10-CM | POA: Diagnosis not present

## 2019-04-27 DIAGNOSIS — Z992 Dependence on renal dialysis: Secondary | ICD-10-CM | POA: Diagnosis not present

## 2019-04-27 DIAGNOSIS — H2511 Age-related nuclear cataract, right eye: Secondary | ICD-10-CM | POA: Diagnosis present

## 2019-04-27 DIAGNOSIS — E1136 Type 2 diabetes mellitus with diabetic cataract: Secondary | ICD-10-CM | POA: Diagnosis not present

## 2019-04-27 DIAGNOSIS — Z888 Allergy status to other drugs, medicaments and biological substances status: Secondary | ICD-10-CM | POA: Insufficient documentation

## 2019-04-27 DIAGNOSIS — Z6841 Body Mass Index (BMI) 40.0 and over, adult: Secondary | ICD-10-CM | POA: Diagnosis not present

## 2019-04-27 DIAGNOSIS — E1122 Type 2 diabetes mellitus with diabetic chronic kidney disease: Secondary | ICD-10-CM | POA: Insufficient documentation

## 2019-04-27 HISTORY — PX: CATARACT EXTRACTION W/PHACO: SHX586

## 2019-04-27 LAB — POCT I-STAT 4, (NA,K, GLUC, HGB,HCT)
Glucose, Bld: 232 mg/dL — ABNORMAL HIGH (ref 70–99)
HCT: 42 % (ref 39.0–52.0)
Hemoglobin: 14.3 g/dL (ref 13.0–17.0)
Potassium: 3.9 mmol/L (ref 3.5–5.1)
Sodium: 138 mmol/L (ref 135–145)

## 2019-04-27 LAB — GLUCOSE, CAPILLARY
Glucose-Capillary: 202 mg/dL — ABNORMAL HIGH (ref 70–99)
Glucose-Capillary: 196 mg/dL — ABNORMAL HIGH (ref 70–99)

## 2019-04-27 SURGERY — PHACOEMULSIFICATION, CATARACT, WITH IOL INSERTION
Anesthesia: Monitor Anesthesia Care | Site: Eye | Laterality: Right

## 2019-04-27 MED ORDER — MOXIFLOXACIN HCL 0.5 % OP SOLN
OPHTHALMIC | Status: DC | PRN
  Administered 2019-04-27: 0.2 mL via OPHTHALMIC

## 2019-04-27 MED ORDER — FENTANYL CITRATE (PF) 100 MCG/2ML IJ SOLN
INTRAMUSCULAR | Status: AC
  Filled 2019-04-27: qty 2

## 2019-04-27 MED ORDER — POVIDONE-IODINE 5 % OP SOLN
OPHTHALMIC | Status: DC | PRN
  Administered 2019-04-27: 1 via OPHTHALMIC

## 2019-04-27 MED ORDER — MOXIFLOXACIN HCL 0.5 % OP SOLN: 1.0000 [drp] | Freq: Once | OPHTHALMIC | Status: DC

## 2019-04-27 MED ORDER — TETRACAINE HCL 0.5 % OP SOLN
1.0000 [drp] | Freq: Once | OPHTHALMIC | Status: AC
  Administered 2019-04-27: 07:00:00 1 [drp] via OPHTHALMIC

## 2019-04-27 MED ORDER — MIDAZOLAM HCL 2 MG/2ML IJ SOLN
INTRAMUSCULAR | Status: DC | PRN
  Administered 2019-04-27: 0.5 mg via INTRAVENOUS
  Administered 2019-04-27: 1 mg via INTRAVENOUS

## 2019-04-27 MED ORDER — ARMC OPHTHALMIC DILATING DROPS
1.0000 "application " | OPHTHALMIC | Status: AC
  Administered 2019-04-27 (×3): 1 via OPHTHALMIC

## 2019-04-27 MED ORDER — TETRACAINE HCL 0.5 % OP SOLN
OPHTHALMIC | Status: AC
  Administered 2019-04-27: 1 [drp] via OPHTHALMIC
  Filled 2019-04-27: qty 4

## 2019-04-27 MED ORDER — FENTANYL CITRATE (PF) 100 MCG/2ML IJ SOLN
INTRAMUSCULAR | Status: DC | PRN
  Administered 2019-04-27: 50 ug via INTRAVENOUS

## 2019-04-27 MED ORDER — SODIUM CHLORIDE 0.9 % IV SOLN
Freq: Once | INTRAVENOUS | Status: AC
  Administered 2019-04-27: 09:00:00 via INTRAVENOUS

## 2019-04-27 MED ORDER — SODIUM HYALURONATE 10 MG/ML IO SOLN
INTRAOCULAR | Status: DC | PRN
  Administered 2019-04-27: 0.55 mL via INTRAOCULAR

## 2019-04-27 MED ORDER — LIDOCAINE HCL (PF) 4 % IJ SOLN
INTRAOCULAR | Status: DC | PRN
  Administered 2019-04-27: 09:00:00 4 mL via OPHTHALMIC

## 2019-04-27 MED ORDER — MOXIFLOXACIN HCL 0.5 % OP SOLN
OPHTHALMIC | Status: AC
  Filled 2019-04-27: qty 3

## 2019-04-27 MED ORDER — SODIUM HYALURONATE 23 MG/ML IO SOLN
INTRAOCULAR | Status: DC | PRN
  Administered 2019-04-27: 0.6 mL via INTRAOCULAR

## 2019-04-27 MED ORDER — EPINEPHRINE PF 1 MG/ML IJ SOLN
INTRAOCULAR | Status: DC | PRN
  Administered 2019-04-27: 09:00:00 1 mL via OPHTHALMIC

## 2019-04-27 MED ORDER — CARBACHOL 0.01 % IO SOLN
INTRAOCULAR | Status: DC | PRN
  Administered 2019-04-27: 0.5 mL via INTRAOCULAR

## 2019-04-27 MED ORDER — ARMC OPHTHALMIC DILATING DROPS
OPHTHALMIC | Status: AC
  Administered 2019-04-27: 1 via OPHTHALMIC
  Filled 2019-04-27: qty 0.5

## 2019-04-27 MED ORDER — FENTANYL CITRATE (PF) 100 MCG/2ML IJ SOLN: INTRAMUSCULAR | Status: DC | PRN

## 2019-04-27 MED ORDER — MIDAZOLAM HCL 2 MG/2ML IJ SOLN
INTRAMUSCULAR | Status: AC
  Filled 2019-04-27: qty 2

## 2019-04-27 SURGICAL SUPPLY — 16 items
DISSECTOR HYDRO NUCLEUS 50X22 (MISCELLANEOUS) ×3 IMPLANT
GLOVE BIO SURGEON STRL SZ8 (GLOVE) ×3 IMPLANT
GLOVE BIOGEL M 6.5 STRL (GLOVE) ×3 IMPLANT
GLOVE SURG LX 7.5 STRW (GLOVE) ×2
GLOVE SURG LX STRL 7.5 STRW (GLOVE) ×1 IMPLANT
GOWN STRL REUS W/ TWL LRG LVL3 (GOWN DISPOSABLE) ×2 IMPLANT
GOWN STRL REUS W/TWL LRG LVL3 (GOWN DISPOSABLE) ×4
LABEL CATARACT MEDS ST (LABEL) ×3 IMPLANT
LENS IOL TECNIS ITEC 17.5 (Intraocular Lens) ×2 IMPLANT
PACK CATARACT (MISCELLANEOUS) ×3 IMPLANT
PACK CATARACT KING (MISCELLANEOUS) ×3 IMPLANT
PACK EYE AFTER SURG (MISCELLANEOUS) ×3 IMPLANT
SOL BSS BAG (MISCELLANEOUS) ×3
SOLUTION BSS BAG (MISCELLANEOUS) ×1 IMPLANT
WATER STERILE IRR 250ML POUR (IV SOLUTION) ×3 IMPLANT
WIPE NON LINTING 3.25X3.25 (MISCELLANEOUS) ×3 IMPLANT

## 2019-04-27 NOTE — Op Note (Signed)
OPERATIVE NOTE  [name] [id number] 04/27/2019   PREOPERATIVE DIAGNOSIS:  Nuclear sclerotic cataract right eye.  H25.11   POSTOPERATIVE DIAGNOSIS:    Nuclear sclerotic cataract right eye.     PROCEDURE:  Phacoemusification with posterior chamber intraocular lens placement of the right eye   LENS:   Implant Name Type Inv. Item Serial No. Manufacturer Lot No. LRB No. Used Action  LENS IOL DIOP 17.5 - [id number] Intraocular Lens LENS IOL DIOP 17.5 [id number] AMO  Right 1 Implanted       PCB00 + 17.5   ULTRASOUND TIME: 0 minutes 34 seconds.  CDE 1.97   SURGEON:  Benay Pillow, MD, MPH  ANESTHESIOLOGIST: Anesthesiologist: Durenda Hurt, MD   ANESTHESIA:  Topical with tetracaine drops augmented with 1% preservative-free intracameral lidocaine.  ESTIMATED BLOOD LOSS: less than 1 mL.   COMPLICATIONS:  None.   DESCRIPTION OF PROCEDURE:  The patient was identified in the holding room and transported to the operating room and placed in the supine position under the operating microscope.  The right eye was identified as the operative eye and it was prepped and draped in the usual sterile ophthalmic fashion.   A 1.0 millimeter clear-corneal paracentesis was made at the 10:30 position. 0.5 ml of preservative-free 1% lidocaine with epinephrine was injected into the anterior chamber.  The anterior chamber was filled with Healon 5 viscoelastic.  A 2.4 millimeter keratome was used to make a near-clear corneal incision at the 8:00 position.  A curvilinear capsulorrhexis was made with a cystotome and capsulorrhexis forceps.  Balanced salt solution was used to hydrodissect and hydrodelineate the nucleus.   Phacoemulsification was then used in stop and chop fashion to remove the lens nucleus and epinucleus.  The remaining cortex was then removed using the irrigation and aspiration handpiece. Healon was then placed into the capsular bag to distend it for lens placement.  A lens was then  injected into the capsular bag.  The remaining viscoelastic was aspirated.   Wounds were hydrated with balanced salt solution.  The anterior chamber was inflated to a physiologic pressure with balanced salt solution.    Intracameral vigamox 0.1 mL undiluted was injected into the eye and a drop placed onto the ocular surface.  No wound leaks were noted.  The patient was taken to the recovery room in stable condition without complications of anesthesia or surgery  Benay Pillow 04/27/2019, 9:29 AM 2

## 2019-04-27 NOTE — H&P (Signed)
The History and Physical notes are on paper, have been signed, and are to be scanned.   I have examined the patient and there are no changes to the H&P.   Attestation: 1. The patient's impairment of visual function is believed not to be correctable with a tolerable change in glasses or contact lenses. 2. Cataract (in the operative eye) is believed to be significantly contributing to the patient's visual impairment. 3. The patient desires surgical correction; the risks, benefits and alternatives have been explained; and a reasonable expectation exists that lens surgery will significantly improve both the visual and functional status of the patient.  I certify the statements are true to the best of my knowledge.  [name] 04/27/2019 8:52 AM

## 2019-04-27 NOTE — Transfer of Care (Signed)
Immediate Anesthesia Transfer of Care Note  Patient: [name]  Procedure(s) Performed: CATARACT EXTRACTION PHACO AND INTRAOCULAR LENS PLACEMENT (IOC) (Right Eye)  Patient Location: PACU  Anesthesia Type:MAC  Level of Consciousness: awake, alert  and oriented  Airway & Oxygen Therapy: Patient Spontanous Breathing and Patient connected to nasal cannula oxygen  Post-op Assessment: Report given to RN and Post -op Vital signs reviewed and stable  Post vital signs: Reviewed and stable  Last Vitals:  Vitals Value Taken Time  BP    Temp 36.3 C 04/27/19 0931  Pulse 57 04/27/19 0931  Resp 16 04/27/19 0931  SpO2 100 % 04/27/19 0931    Last Pain:  Vitals:   04/27/19 0931  TempSrc: Temporal  PainSc: 0-No pain         Complications: No apparent anesthesia complications

## 2019-04-27 NOTE — Anesthesia Post-op Follow-up Note (Signed)
Anesthesia QCDR form completed.        

## 2019-04-27 NOTE — Anesthesia Postprocedure Evaluation (Signed)
Anesthesia Post Note  Patient: [name]  Procedure(s) Performed: CATARACT EXTRACTION PHACO AND INTRAOCULAR LENS PLACEMENT ([place]) (Right Eye)  Patient location during evaluation: Phase II Anesthesia Type: MAC Level of consciousness: awake, awake and alert and patient cooperative Pain management: pain level controlled Vital Signs Assessment: post-procedure vital signs reviewed and stable Respiratory status: spontaneous breathing Cardiovascular status: blood pressure returned to baseline and stable Postop Assessment: no headache Anesthetic complications: no     Last Vitals:  Vitals:   04/27/19 0708 04/27/19 0931  BP: 129/84 135/75  Pulse: 70 (!) 57  Resp: 16 16  Temp: (!) 35.6 C (!) 36.3 C  SpO2: 100% 100%    Last Pain:  Vitals:   04/27/19 0931  TempSrc: Temporal  PainSc: 0-No pain                 Buckner Malta

## 2019-04-27 NOTE — Discharge Instructions (Addendum)
Eye Surgery Discharge Instructions  Expect mild scratchy sensation or mild soreness. DO NOT RUB YOUR EYE!  The day of surgery:  Minimal physical activity, but bed rest is not required  No reading, computer work, or close hand work  No bending, lifting, or straining.  May watch TV  For 24 hours:  No driving, legal decisions, or alcoholic beverages  Safety precautions  Eat anything you prefer: It is better to start with liquids, then soup then solid foods.  Solar shield eyeglasses should be worn for comfort in the sunlight/patch while sleeping  Resume all regular medications including aspirin or Coumadin if these were discontinued prior to surgery. You may shower, bathe, shave, or wash your hair. Tylenol may be taken for mild discomfort. FOLLOW EYE DROP INSTRUCTION SHEET AS REVIEWED.  Call your doctor if you experience significant pain, nausea, or vomiting, fever > 101 or other signs of infection. [phone] or [phone] Specific instructions:  Follow-up Information    Eulogio Bear, MD Follow up.   Specialty: Ophthalmology Why: 04-27-19 @ 3:15 pm  Contact information: [address] [place] [address] [phone]

## 2019-04-27 NOTE — Anesthesia Preprocedure Evaluation (Addendum)
Anesthesia Evaluation  Patient identified by MRN, date of birth, ID band Patient awake    Reviewed: Allergy & Precautions, H&P , NPO status , Patient's Chart, lab work & pertinent test results  History of Anesthesia Complications Negative for: history of anesthetic complications  Airway Mallampati: III  TM Distance: >3 FB     Dental  (+) Chipped   Pulmonary shortness of breath, sleep apnea (pt denies OSA) , former smoker,           Cardiovascular hypertension, (-) angina+ Peripheral Vascular Disease and + Orthopnea  (-) Past MI and (-) Cardiac Stents      Neuro/Psych PSYCHIATRIC DISORDERS Anxiety Depression negative neurological ROS     GI/Hepatic negative GI ROS, Neg liver ROS,   Endo/Other  diabetesMorbid obesity  Renal/GU CRFRenal diseaseDialyzed yesterday     Musculoskeletal   Abdominal   Peds  Hematology negative hematology ROS (+)   Anesthesia Other Findings Past Medical History: No date: Anxiety No date: Chronic kidney disease     Comment:  14% FUNCTION No date: Depression No date: Diabetes mellitus without complication (HCC) No date: Dyspnea     Comment:  DOE No date: Dysrhythmia No date: History of orthopnea No date: Hypertension No date: Lymphedema of leg No date: Microalbuminuria No date: Sleep apnea     Comment:  no CPAP No date: Vitamin D deficiency  Past Surgical History: 10/24/2018: A/V FISTULAGRAM; Right     Comment:  Procedure: A/V FISTULAGRAM;  Surgeon: Katha Cabal, MD;  Location: [place] [facility];  Service:               Cardiovascular;  Laterality: Right; 11/24/2018: A/V FISTULAGRAM; Right     Comment:  Procedure: A/V FISTULAGRAM;  Surgeon: Katha Cabal, MD;  Location: [place] [facility];  Service:               Cardiovascular;  Laterality: Right; 02/21/2019: A/V SHUNT INTERVENTION; Right     Comment:  Procedure: A/V SHUNT INTERVENTION;   Surgeon: Katha Cabal, MD;  Location: [place] [facility];  Service:              Cardiovascular;  Laterality: Right; 11/05/9371: APPLICATION OF WOUND VAC; Left     Comment:  Procedure: APPLICATION OF WOUND VAC;  Surgeon: Algernon Huxley, MD;  Location: [facility];  Service: General;                Laterality: Left; 12/26/7679: APPLICATION OF WOUND VAC; Left     Comment:  Procedure: APPLICATION OF WOUND VAC;  Surgeon: Katha Cabal, MD;  Location: [facility];  Service: Vascular;                Laterality: Left; 1/57/2620: APPLICATION OF WOUND VAC; Left     Comment:  Procedure: WOUND VAC CHANGE;  Surgeon: Katha Cabal, MD;  Location: [facility];  Service: Vascular;                Laterality:  Left;  left lower leg 10/13/863: APPLICATION OF WOUND VAC; Left     Comment:  Procedure: WOUND VAC CHANGE;  Surgeon: Katha Cabal, MD;  Location: [facility];  Service: Vascular;                Laterality: Left; 03/07/4695: APPLICATION OF WOUND VAC; Left     Comment:  Procedure: APPLICATION OF WOUND VAC;  Surgeon: Katha Cabal, MD;  Location: [facility];  Service: Vascular;                Laterality: Left; 07/19/2018: AV FISTULA INSERTION W/ RF MAGNETIC GUIDANCE; Right     Comment:  Procedure: AV FISTULA INSERTION W/RF MAGNETIC GUIDANCE;               Surgeon: Katha Cabal, MD;  Location: [place]              [facility];  Service: Cardiovascular;  Laterality: Right; 11/01/2018: CATARACT EXTRACTION W/PHACO; Left     Comment:  Procedure: CATARACT EXTRACTION PHACO AND INTRAOCULAR               LENS PLACEMENT (IOC)-LEFT, DIABETIC-INSULIN DEPENDENT;                Surgeon: Eulogio Bear, MD;  Location: [facility];                Service: Ophthalmology;  Laterality: Left;  [place]               00:41.8 CDE 4.61 Fluid Pack Lot # [id number] 02/23/2019: DIALYSIS/PERMA CATHETER INSERTION; N/A     Comment:   Procedure: DIALYSIS/PERMA CATHETER INSERTION;  Surgeon:               Algernon Huxley, MD;  Location: [place] [facility];                Service: Cardiovascular;  Laterality: N/A; 10/11/2017: I&D EXTREMITY; Left     Comment:  Procedure: IRRIGATION AND DEBRIDEMENT EXTREMITY;                Surgeon: Katha Cabal, MD;  Location: [facility];                Service: Vascular;  Laterality: Left; 10/07/2017: INCISION AND DRAINAGE ABSCESS; Right     Comment:  Procedure: INCISION AND DRAINAGE ABSCESS;  Surgeon:               Katha Cabal, MD;  Location: [facility];  Service:               Vascular;  Laterality: Right; 10/03/2017: IRRIGATION AND DEBRIDEMENT ABSCESS; Left     Comment:  Procedure: IRRIGATION AND [place] with               debridement of skin, soft tissue, muscle 50sq cm;                Surgeon: Algernon Huxley, MD;  Location: [facility];  Service:              General;  Laterality: Left; 02/20/2019: TEMPORARY DIALYSIS CATHETER     Comment:  Procedure: TEMPORARY DIALYSIS CATHETER;  Surgeon:               Katha Cabal, MD;  Location: [place] [facility];               Service: Cardiovascular;;  BMI    Body Mass Index: 44.81 kg/m      Reproductive/Obstetrics negative OB ROS                            Anesthesia Physical Anesthesia Plan  ASA: III  Anesthesia Plan: MAC   Post-op Pain Management:    Induction:   PONV Risk Score and Plan:   Airway Management Planned: Natural Airway and Nasal Cannula  Additional Equipment:   Intra-op Plan:   Post-operative Plan:   Informed Consent: I have reviewed the patients History and Physical, chart, labs and discussed the procedure including the risks, benefits and alternatives for the proposed anesthesia with the patient or authorized representative who has indicated his/her understanding and acceptance.       Plan Discussed with: Anesthesiologist and CRNA  Anesthesia Plan Comments:         Anesthesia Quick Evaluation

## 2019-05-03 ENCOUNTER — Other Ambulatory Visit (INDEPENDENT_AMBULATORY_CARE_PROVIDER_SITE_OTHER): Payer: Self-pay | Admitting: Nurse Practitioner

## 2019-05-08 ENCOUNTER — Encounter
Admission: RE | Admit: 2019-05-08 | Discharge: 2019-05-08 | Disposition: A | Discharge status: Home / Self Care | Payer: Medicare Other | Source: Ambulatory Visit | Attending: Vascular Surgery | Admitting: Vascular Surgery

## 2019-05-08 ENCOUNTER — Other Ambulatory Visit: Payer: Self-pay

## 2019-05-08 DIAGNOSIS — Z87891 Personal history of nicotine dependence: Secondary | ICD-10-CM | POA: Diagnosis not present

## 2019-05-08 DIAGNOSIS — I120 Hypertensive chronic kidney disease with stage 5 chronic kidney disease or end stage renal disease: Secondary | ICD-10-CM | POA: Diagnosis not present

## 2019-05-08 DIAGNOSIS — I890 Lymphedema, not elsewhere classified: Secondary | ICD-10-CM | POA: Diagnosis not present

## 2019-05-08 DIAGNOSIS — Z79899 Other long term (current) drug therapy: Secondary | ICD-10-CM | POA: Diagnosis not present

## 2019-05-08 DIAGNOSIS — N186 End stage renal disease: Secondary | ICD-10-CM | POA: Diagnosis not present

## 2019-05-08 DIAGNOSIS — E1122 Type 2 diabetes mellitus with diabetic chronic kidney disease: Secondary | ICD-10-CM | POA: Diagnosis not present

## 2019-05-08 DIAGNOSIS — Z7901 Long term (current) use of anticoagulants: Secondary | ICD-10-CM | POA: Diagnosis not present

## 2019-05-08 DIAGNOSIS — Z20828 Contact with and (suspected) exposure to other viral communicable diseases: Secondary | ICD-10-CM | POA: Insufficient documentation

## 2019-05-08 DIAGNOSIS — Z794 Long term (current) use of insulin: Secondary | ICD-10-CM | POA: Diagnosis not present

## 2019-05-08 DIAGNOSIS — Z01818 Encounter for other preprocedural examination: Secondary | ICD-10-CM | POA: Insufficient documentation

## 2019-05-08 DIAGNOSIS — G4730 Sleep apnea, unspecified: Secondary | ICD-10-CM | POA: Diagnosis not present

## 2019-05-08 LAB — BASIC METABOLIC PANEL
Anion gap: 14 (ref 5–15)
BUN: 56 mg/dL — ABNORMAL HIGH (ref 6–20)
CO2: 23 mmol/L (ref 22–32)
Calcium: 8.6 mg/dL — ABNORMAL LOW (ref 8.9–10.3)
Chloride: 98 mmol/L (ref 98–111)
Creatinine, Ser: 12.5 mg/dL — ABNORMAL HIGH (ref 0.61–1.24)
GFR calc Af Amer: 5 mL/min — ABNORMAL LOW (ref >60)
GFR calc non Af Amer: 4 mL/min — ABNORMAL LOW (ref >60)
Glucose, Bld: 150 mg/dL — ABNORMAL HIGH (ref 70–99)
Potassium: 4 mmol/L (ref 3.5–5.1)
Sodium: 135 mmol/L (ref 135–145)

## 2019-05-08 LAB — CBC WITH DIFFERENTIAL/PLATELET
Abs Immature Granulocytes: 0.02 10*3/uL (ref 0.00–0.07)
Basophils Absolute: 0 10*3/uL (ref 0.0–0.1)
Basophils Relative: 0 %
Eosinophils Absolute: 0.1 10*3/uL (ref 0.0–0.5)
Eosinophils Relative: 1 %
HCT: 39.7 % (ref 39.0–52.0)
Hemoglobin: 12.8 g/dL — ABNORMAL LOW (ref 13.0–17.0)
Immature Granulocytes: 0 %
Lymphocytes Relative: 23 %
Lymphs Abs: 1.3 10*3/uL (ref 0.7–4.0)
MCH: 31.1 pg (ref 26.0–34.0)
MCHC: 32.2 g/dL (ref 30.0–36.0)
MCV: 96.6 fL (ref 80.0–100.0)
Monocytes Absolute: 0.6 10*3/uL (ref 0.1–1.0)
Monocytes Relative: 11 %
Neutro Abs: 3.7 10*3/uL (ref 1.7–7.7)
Neutrophils Relative %: 65 %
Platelets: 199 10*3/uL (ref 150–400)
RBC: 4.11 MIL/uL — ABNORMAL LOW (ref 4.22–5.81)
RDW: 13.2 % (ref 11.5–15.5)
WBC: 5.7 10*3/uL (ref 4.0–10.5)
nRBC: 0 % (ref 0.0–0.2)

## 2019-05-08 LAB — SARS CORONAVIRUS 2 (TAT 6-24 HRS): SARS Coronavirus 2: NEGATIVE

## 2019-05-08 LAB — PROTIME-INR
INR: 1 (ref 0.8–1.2)
Prothrombin Time: 13.3 seconds (ref 11.4–15.2)

## 2019-05-08 LAB — TYPE AND SCREEN
ABO/RH(D): A POS
Antibody Screen: NEGATIVE

## 2019-05-08 LAB — APTT: aPTT: 33 seconds (ref 24–36)

## 2019-05-08 NOTE — Patient Instructions (Signed)
Your procedure is scheduled on: Friday 05/11/19.  Report to DAY SURGERY DEPARTMENT LOCATED ON 2ND FLOOR MEDICAL MALL ENTRANCE. To find out your arrival time please call [phone] between 1PM - 3PM on Thursday 05/10/19.   Remember: Instructions that are not followed completely may result in serious medical risk, up to and including death, or upon the discretion of your surgeon and anesthesiologist your surgery may need to be rescheduled.      _X__ 1. Do not eat food after midnight the night before your procedure.                 No gum chewing or hard candies. You may drink WATER up to 2 hours                 before you are scheduled to arrive for your surgery- DO NOTwater                 within 2 hours of the start of your surgery.                    __X__2.  On the morning of surgery brush your teeth with toothpaste and water, you may rinse your mouth with mouthwash if you wish.  Do not swallow any toothpaste or mouthwash.       _X__ 3.  No Alcohol for 24 hours before or after surgery.    __X__4.  Notify your doctor if there is any change in your medical condition      (cold, fever, infections).      Do not wear jewelry, make-up, hairpins, clips or nail polish. Do not wear lotions, powders, or perfumes.  Do not shave 48 hours prior to surgery. Men may shave face and neck. Do not bring valuables to the hospital.    [facility] is not responsible for any belongings or valuables.   Contacts, dentures/partials or body piercings may not be worn into surgery. Bring a case for your contacts, glasses or hearing aids, a denture cup will be supplied.   Patients discharged the day of surgery will not be allowed to drive home.    __X__ Take these medicines the morning of surgery with A SIP OF WATER:     1. metolazone (ZAROXOLYN) 5 MG tablet      __X__ Use CHG Soap as directed    __X__ Take 1/2 of usual insulin dose the night before surgery. No insulin the morning   of surgery.    __X__ Stop Blood Thinners: Eliquis today. Must be stopped 3 days prior to your procedure.   __X__ Stop Anti-inflammatories 7 days before surgery such as Advil, Ibuprofen, Motrin, BC or Goodies Powder, Naprosyn, Naproxen, Aleve, Aspirin, Meloxicam. May take Tylenol if needed for pain or discomfort.    __X__ Stop all Omega 3 Fatty Acids until after your procedure.

## 2019-05-10 MED ORDER — CEFAZOLIN SODIUM-DEXTROSE 1-4 GM/50ML-% IV SOLN
1.0000 g | INTRAVENOUS | Status: AC
  Administered 2019-05-11: 08:00:00 1 g via INTRAVENOUS

## 2019-05-11 ENCOUNTER — Other Ambulatory Visit: Payer: Self-pay

## 2019-05-11 ENCOUNTER — Ambulatory Visit: Payer: Medicare Other | Admitting: Certified Registered Nurse Anesthetist

## 2019-05-11 ENCOUNTER — Encounter: Payer: Self-pay | Admitting: *Deleted

## 2019-05-11 ENCOUNTER — Encounter
Admission: RE | Disposition: A | Discharge status: Home / Self Care | Payer: Self-pay | Source: Home / Self Care | Attending: Vascular Surgery

## 2019-05-11 ENCOUNTER — Ambulatory Visit
Admission: RE | Admit: 2019-05-11 | Discharge: 2019-05-11 | Disposition: A | Discharge status: Home / Self Care | Payer: Medicare Other | Attending: Vascular Surgery | Admitting: Vascular Surgery

## 2019-05-11 DIAGNOSIS — G4730 Sleep apnea, unspecified: Secondary | ICD-10-CM | POA: Insufficient documentation

## 2019-05-11 DIAGNOSIS — E1122 Type 2 diabetes mellitus with diabetic chronic kidney disease: Secondary | ICD-10-CM | POA: Insufficient documentation

## 2019-05-11 DIAGNOSIS — I890 Lymphedema, not elsewhere classified: Secondary | ICD-10-CM | POA: Insufficient documentation

## 2019-05-11 DIAGNOSIS — N186 End stage renal disease: Secondary | ICD-10-CM | POA: Insufficient documentation

## 2019-05-11 DIAGNOSIS — I120 Hypertensive chronic kidney disease with stage 5 chronic kidney disease or end stage renal disease: Secondary | ICD-10-CM | POA: Diagnosis not present

## 2019-05-11 DIAGNOSIS — Z7901 Long term (current) use of anticoagulants: Secondary | ICD-10-CM | POA: Insufficient documentation

## 2019-05-11 DIAGNOSIS — Z794 Long term (current) use of insulin: Secondary | ICD-10-CM | POA: Insufficient documentation

## 2019-05-11 DIAGNOSIS — N185 Chronic kidney disease, stage 5: Secondary | ICD-10-CM | POA: Diagnosis not present

## 2019-05-11 DIAGNOSIS — Z20828 Contact with and (suspected) exposure to other viral communicable diseases: Secondary | ICD-10-CM | POA: Insufficient documentation

## 2019-05-11 DIAGNOSIS — Z79899 Other long term (current) drug therapy: Secondary | ICD-10-CM | POA: Insufficient documentation

## 2019-05-11 DIAGNOSIS — Z87891 Personal history of nicotine dependence: Secondary | ICD-10-CM | POA: Insufficient documentation

## 2019-05-11 HISTORY — PX: AV FISTULA PLACEMENT: SHX1204

## 2019-05-11 LAB — GLUCOSE, CAPILLARY: Glucose-Capillary: 174 mg/dL — ABNORMAL HIGH (ref 70–99)

## 2019-05-11 LAB — POCT I-STAT 4, (NA,K, GLUC, HGB,HCT)
Glucose, Bld: 216 mg/dL — ABNORMAL HIGH (ref 70–99)
HCT: 42 % (ref 39.0–52.0)
Hemoglobin: 14.3 g/dL (ref 13.0–17.0)
Potassium: 4 mmol/L (ref 3.5–5.1)
Sodium: 136 mmol/L (ref 135–145)

## 2019-05-11 SURGERY — ARTERIOVENOUS (AV) FISTULA CREATION
Anesthesia: General | Laterality: Left

## 2019-05-11 MED ORDER — FENTANYL CITRATE (PF) 100 MCG/2ML IJ SOLN
INTRAMUSCULAR | Status: AC
  Filled 2019-05-11: qty 2

## 2019-05-11 MED ORDER — FENTANYL CITRATE (PF) 100 MCG/2ML IJ SOLN
INTRAMUSCULAR | Status: DC | PRN
  Administered 2019-05-11 (×2): 50 ug via INTRAVENOUS

## 2019-05-11 MED ORDER — FENTANYL CITRATE (PF) 100 MCG/2ML IJ SOLN: 25.0000 ug | INTRAMUSCULAR | Status: DC | PRN

## 2019-05-11 MED ORDER — CHLORHEXIDINE GLUCONATE CLOTH 2 % EX PADS: 6.0000 | MEDICATED_PAD | Freq: Once | CUTANEOUS | Status: DC

## 2019-05-11 MED ORDER — PAPAVERINE HCL 30 MG/ML IJ SOLN
INTRAMUSCULAR | Status: AC
  Filled 2019-05-11: qty 2

## 2019-05-11 MED ORDER — BUPIVACAINE LIPOSOME 1.3 % IJ SUSP
INTRAMUSCULAR | Status: AC
  Filled 2019-05-11: qty 20

## 2019-05-11 MED ORDER — PROPOFOL 10 MG/ML IV BOLUS
INTRAVENOUS | Status: DC | PRN
  Administered 2019-05-11: 200 mg via INTRAVENOUS
  Administered 2019-05-11: 70 mg via INTRAVENOUS

## 2019-05-11 MED ORDER — ACETAMINOPHEN 10 MG/ML IV SOLN
INTRAVENOUS | Status: DC | PRN
  Administered 2019-05-11: 1000 mg via INTRAVENOUS

## 2019-05-11 MED ORDER — ONDANSETRON HCL 4 MG/2ML IJ SOLN: 4.0000 mg | Freq: Four times a day (QID) | INTRAMUSCULAR | Status: DC | PRN

## 2019-05-11 MED ORDER — SODIUM CHLORIDE 0.9 % IV SOLN
INTRAVENOUS | Status: DC
  Administered 2019-05-11 (×2): via INTRAVENOUS

## 2019-05-11 MED ORDER — MIDAZOLAM HCL 2 MG/2ML IJ SOLN
INTRAMUSCULAR | Status: DC | PRN
  Administered 2019-05-11: 2 mg via INTRAVENOUS

## 2019-05-11 MED ORDER — HYDROCODONE-ACETAMINOPHEN 5-325 MG PO TABS: 1.0000 | ORAL_TABLET | Freq: Four times a day (QID) | ORAL | 0 refills | Status: DC | PRN

## 2019-05-11 MED ORDER — LIDOCAINE HCL (CARDIAC) PF 100 MG/5ML IV SOSY
PREFILLED_SYRINGE | INTRAVENOUS | Status: DC | PRN
  Administered 2019-05-11: 100 mg via INTRAVENOUS

## 2019-05-11 MED ORDER — BUPIVACAINE HCL (PF) 0.5 % IJ SOLN
INTRAMUSCULAR | Status: AC
  Filled 2019-05-11: qty 30

## 2019-05-11 MED ORDER — ONDANSETRON HCL 4 MG/2ML IJ SOLN
INTRAMUSCULAR | Status: DC | PRN
  Administered 2019-05-11: 4 mg via INTRAVENOUS

## 2019-05-11 MED ORDER — FAMOTIDINE 20 MG PO TABS
ORAL_TABLET | ORAL | Status: AC
  Administered 2019-05-11: 07:00:00 20 mg via ORAL
  Filled 2019-05-11: qty 1

## 2019-05-11 MED ORDER — DEXAMETHASONE SODIUM PHOSPHATE 10 MG/ML IJ SOLN
INTRAMUSCULAR | Status: DC | PRN
  Administered 2019-05-11: 5 mg via INTRAVENOUS

## 2019-05-11 MED ORDER — SUGAMMADEX SODIUM 500 MG/5ML IV SOLN
INTRAVENOUS | Status: DC | PRN
  Administered 2019-05-11: 350 mg via INTRAVENOUS

## 2019-05-11 MED ORDER — SUCCINYLCHOLINE CHLORIDE 20 MG/ML IJ SOLN
INTRAMUSCULAR | Status: DC | PRN
  Administered 2019-05-11: 170 mg via INTRAVENOUS

## 2019-05-11 MED ORDER — MIDAZOLAM HCL 2 MG/2ML IJ SOLN
INTRAMUSCULAR | Status: AC
  Filled 2019-05-11: qty 2

## 2019-05-11 MED ORDER — PROPOFOL 10 MG/ML IV BOLUS
INTRAVENOUS | Status: AC
  Filled 2019-05-11: qty 20

## 2019-05-11 MED ORDER — PROPOFOL 10 MG/ML IV BOLUS
INTRAVENOUS | Status: AC
  Filled 2019-05-11: qty 40

## 2019-05-11 MED ORDER — PROMETHAZINE HCL 25 MG/ML IJ SOLN: 6.2500 mg | INTRAMUSCULAR | Status: DC | PRN

## 2019-05-11 MED ORDER — HEPARIN SODIUM (PORCINE) 5000 UNIT/ML IJ SOLN
INTRAMUSCULAR | Status: AC
  Filled 2019-05-11: qty 1

## 2019-05-11 MED ORDER — FAMOTIDINE 20 MG PO TABS
20.0000 mg | ORAL_TABLET | Freq: Once | ORAL | Status: AC
  Administered 2019-05-11: 07:00:00 20 mg via ORAL

## 2019-05-11 MED ORDER — CEFAZOLIN SODIUM-DEXTROSE 1-4 GM/50ML-% IV SOLN
INTRAVENOUS | Status: AC
  Filled 2019-05-11: qty 50

## 2019-05-11 MED ORDER — ACETAMINOPHEN 10 MG/ML IV SOLN
INTRAVENOUS | Status: AC
  Filled 2019-05-11: qty 100

## 2019-05-11 MED ORDER — SODIUM CHLORIDE 0.9 % IV SOLN
INTRAVENOUS | Status: DC | PRN
  Administered 2019-05-11: 09:00:00 50 ug/min via INTRAVENOUS

## 2019-05-11 MED ORDER — HYDROMORPHONE HCL 1 MG/ML IJ SOLN: 1.0000 mg | Freq: Once | INTRAMUSCULAR | Status: DC | PRN

## 2019-05-11 MED ORDER — BUPIVACAINE LIPOSOME 1.3 % IJ SUSP
INTRAMUSCULAR | Status: DC | PRN
  Administered 2019-05-11: 14 mL

## 2019-05-11 MED ORDER — PROPOFOL 500 MG/50ML IV EMUL
INTRAVENOUS | Status: AC
  Filled 2019-05-11: qty 50

## 2019-05-11 MED ORDER — ROCURONIUM BROMIDE 100 MG/10ML IV SOLN
INTRAVENOUS | Status: DC | PRN
  Administered 2019-05-11: 50 mg via INTRAVENOUS

## 2019-05-11 MED ORDER — BUPIVACAINE HCL (PF) 0.5 % IJ SOLN: INTRAMUSCULAR | Status: DC | PRN

## 2019-05-11 SURGICAL SUPPLY — 54 items
APPLIER CLIP 11 MED OPEN (CLIP)
APPLIER CLIP 9.375 SM OPEN (CLIP)
BAG DECANTER FOR FLEXI CONT (MISCELLANEOUS) ×3 IMPLANT
BLADE SURG SZ11 CARB STEEL (BLADE) ×3 IMPLANT
BOOT SUTURE AID YELLOW STND (SUTURE) ×3 IMPLANT
BRUSH SCRUB EZ  4% CHG (MISCELLANEOUS) ×2
BRUSH SCRUB EZ 4% CHG (MISCELLANEOUS) ×1 IMPLANT
CANISTER SUCT 1200ML W/VALVE (MISCELLANEOUS) ×3 IMPLANT
CHLORAPREP W/TINT 26 (MISCELLANEOUS) ×3 IMPLANT
CLIP APPLIE 11 MED OPEN (CLIP) IMPLANT
CLIP APPLIE 9.375 SM OPEN (CLIP) IMPLANT
COVER WAND RF STERILE (DRAPES) ×3 IMPLANT
DERMABOND ADVANCED (GAUZE/BANDAGES/DRESSINGS) ×2
DERMABOND ADVANCED .7 DNX12 (GAUZE/BANDAGES/DRESSINGS) ×1 IMPLANT
DRESSING SURGICEL FIBRLLR 1X2 (HEMOSTASIS) IMPLANT
DRSG SURGICEL FIBRILLAR 1X2 (HEMOSTASIS)
ELECT CAUTERY BLADE 6.4 (BLADE) ×3 IMPLANT
ELECT REM PT RETURN 9FT ADLT (ELECTROSURGICAL) ×3
ELECTRODE REM PT RTRN 9FT ADLT (ELECTROSURGICAL) ×1 IMPLANT
GEL ULTRASOUND 20GR AQUASONIC (MISCELLANEOUS) IMPLANT
GLOVE BIO SURGEON STRL SZ7 (GLOVE) ×3 IMPLANT
GLOVE INDICATOR 7.5 STRL GRN (GLOVE) ×3 IMPLANT
GLOVE SURG SYN 8.0 (GLOVE) ×3 IMPLANT
GOWN STRL REUS W/ TWL LRG LVL3 (GOWN DISPOSABLE) ×2 IMPLANT
GOWN STRL REUS W/ TWL XL LVL3 (GOWN DISPOSABLE) ×1 IMPLANT
GOWN STRL REUS W/TWL LRG LVL3 (GOWN DISPOSABLE) ×4
GOWN STRL REUS W/TWL XL LVL3 (GOWN DISPOSABLE) ×2
IV NS 500ML (IV SOLUTION) ×2
IV NS 500ML BAXH (IV SOLUTION) ×1 IMPLANT
KIT TURNOVER KIT A (KITS) ×3 IMPLANT
LABEL OR SOLS (LABEL) ×3 IMPLANT
LOOP RED MAXI  1X406MM (MISCELLANEOUS) ×2
LOOP VESSEL MAXI 1X406 RED (MISCELLANEOUS) ×1 IMPLANT
LOOP VESSEL MINI 0.8X406 BLUE (MISCELLANEOUS) ×2 IMPLANT
LOOPS BLUE MINI 0.8X406MM (MISCELLANEOUS) ×4
NEEDLE FILTER BLUNT 18X 1/2SAF (NEEDLE) ×2
NEEDLE FILTER BLUNT 18X1 1/2 (NEEDLE) ×1 IMPLANT
NEEDLE HYPO 30X.5 LL (NEEDLE) IMPLANT
PACK EXTREMITY ARMC (MISCELLANEOUS) ×3 IMPLANT
PAD PREP 24X41 OB/GYN DISP (PERSONAL CARE ITEMS) ×3 IMPLANT
STOCKINETTE STRL 4IN 9604848 (GAUZE/BANDAGES/DRESSINGS) ×3 IMPLANT
SUT MNCRL+ 5-0 UNDYED PC-3 (SUTURE) ×1 IMPLANT
SUT MONOCRYL 5-0 (SUTURE) ×2
SUT PROLENE 6 0 BV (SUTURE) ×12 IMPLANT
SUT SILK 2 0 (SUTURE) ×2
SUT SILK 2-0 18XBRD TIE 12 (SUTURE) ×1 IMPLANT
SUT SILK 3 0 (SUTURE) ×2
SUT SILK 3-0 18XBRD TIE 12 (SUTURE) ×1 IMPLANT
SUT SILK 4 0 (SUTURE) ×2
SUT SILK 4-0 18XBRD TIE 12 (SUTURE) ×1 IMPLANT
SUT VIC AB 3-0 SH 27 (SUTURE) ×2
SUT VIC AB 3-0 SH 27X BRD (SUTURE) ×1 IMPLANT
SYR 20ML LL LF (SYRINGE) ×3 IMPLANT
SYR 3ML LL SCALE MARK (SYRINGE) ×3 IMPLANT

## 2019-05-11 NOTE — Transfer of Care (Signed)
Immediate Anesthesia Transfer of Care Note  Patient: [name]  Procedure(s) Performed: ARTERIOVENOUS (AV) FISTULA CREATION (RADIOCEPHALIC ) (Left )  Patient Location: PACU  Anesthesia Type:General  Level of Consciousness: awake, oriented, drowsy and patient cooperative  Airway & Oxygen Therapy: Patient Spontanous Breathing  Post-op Assessment: Report given to RN and Post -op Vital signs reviewed and stable  Post vital signs: Reviewed and stable  Last Vitals:  Vitals Value Taken Time  BP 156/92 05/11/19 0950  Temp 36.1 C 05/11/19 0950  Pulse 71 05/11/19 0951  Resp 24 05/11/19 0951  SpO2 94 % 05/11/19 0951  Vitals shown include unvalidated device data.  Last Pain:  Vitals:   05/11/19 0950  TempSrc:   PainSc: 0-No pain         Complications: No apparent anesthesia complications

## 2019-05-11 NOTE — Anesthesia Postprocedure Evaluation (Signed)
Anesthesia Post Note  Patient: Colden A Mansouri  Procedure(s) Performed: ARTERIOVENOUS (AV) FISTULA CREATION (RADIOCEPHALIC ) (Left )  Patient location during evaluation: PACU Anesthesia Type: General Level of consciousness: awake and alert Pain management: pain level controlled Vital Signs Assessment: post-procedure vital signs reviewed and stable Respiratory status: spontaneous breathing, nonlabored ventilation, respiratory function stable and patient connected to nasal cannula oxygen Cardiovascular status: blood pressure returned to baseline and stable Postop Assessment: no apparent nausea or vomiting Anesthetic complications: no     Last Vitals:  Vitals:   05/11/19 1036 05/11/19 1109  BP: (!) 141/77 (!) 148/78  Pulse: 67 94  Resp: 18 18  Temp: (!) 36.3 C   SpO2: 96% 96%    Last Pain:  Vitals:   05/11/19 1109  TempSrc:   PainSc: 0-No pain                 Martha Clan

## 2019-05-11 NOTE — Anesthesia Post-op Follow-up Note (Signed)
Anesthesia QCDR form completed.        

## 2019-05-11 NOTE — H&P (Signed)
[facility] History & Physical Update  The patient was interviewed and re-examined.  The patient's previous History and Physical has been reviewed and is unchanged.  There is no change in the plan of care. We plan to proceed with the scheduled procedure.  [name], MD  05/11/2019, 7:31 AM

## 2019-05-11 NOTE — Anesthesia Preprocedure Evaluation (Signed)
Anesthesia Evaluation  Patient identified by MRN, date of birth, ID band Patient awake    Reviewed: Allergy & Precautions, NPO status , Patient's Chart, lab work & pertinent test results  History of Anesthesia Complications Negative for: history of anesthetic complications  Airway Mallampati: III  TM Distance: >3 FB Neck ROM: Full    Dental  (+) Poor Dentition   Pulmonary neg shortness of breath, sleep apnea (told he needed a CPAP but didn't get it ) , neg COPD, neg recent URI, former smoker,    breath sounds clear to auscultation- rhonchi (-) wheezing      Cardiovascular hypertension, Pt. on medications (-) angina(-) CAD, (-) Past MI, (-) Cardiac Stents and (-) CABG + dysrhythmias (-) Valvular Problems/Murmurs Rhythm:Regular Rate:Normal - Systolic murmurs and - Diastolic murmurs    Neuro/Psych neg Seizures PSYCHIATRIC DISORDERS Anxiety Depression negative neurological ROS     GI/Hepatic negative GI ROS, Neg liver ROS,   Endo/Other  diabetes, Insulin Dependent  Renal/GU CRFRenal disease (has fistula but has not started dialysis yet)     Musculoskeletal negative musculoskeletal ROS (+)   Abdominal (+) + obese,   Peds  Hematology negative hematology ROS (+)   Anesthesia Other Findings Past Medical History: No date: Anxiety No date: Chronic kidney disease     Comment:  14% FUNCTION No date: Depression No date: Diabetes mellitus without complication (HCC) No date: Dyspnea     Comment:  DOE No date: Dysrhythmia No date: History of orthopnea No date: Hypertension No date: Lymphedema of leg No date: Microalbuminuria No date: Sleep apnea     Comment:  no CPAP No date: Vitamin D deficiency   Reproductive/Obstetrics                             Anesthesia Physical  Anesthesia Plan  ASA: III  Anesthesia Plan: General   Post-op Pain Management:    Induction: Intravenous  PONV Risk  Score and Plan: 1 and Midazolam, Ondansetron, Dexamethasone, Promethazine and Treatment may vary due to age or medical condition  Airway Management Planned: Oral ETT  Additional Equipment:   Intra-op Plan:   Post-operative Plan: Extubation in OR  Informed Consent: I have reviewed the patients History and Physical, chart, labs and discussed the procedure including the risks, benefits and alternatives for the proposed anesthesia with the patient or authorized representative who has indicated his/her understanding and acceptance.       Plan Discussed with: CRNA and Anesthesiologist  Anesthesia Plan Comments:         Anesthesia Quick Evaluation

## 2019-05-11 NOTE — Discharge Instructions (Signed)

## 2019-05-11 NOTE — Anesthesia Procedure Notes (Signed)
Procedure Name: Intubation Date/Time: 05/11/2019 7:49 AM Performed by: Lowry Bowl, CRNA Pre-anesthesia Checklist: Patient identified, Emergency Drugs available, Suction available and Patient being monitored Patient Re-evaluated:Patient Re-evaluated prior to induction Oxygen Delivery Method: Circle system utilized Preoxygenation: Pre-oxygenation with 100% oxygen Induction Type: IV induction and Cricoid Pressure applied Ventilation: Mask ventilation without difficulty Laryngoscope Size: Mac and 4 Grade View: Grade II Tube type: Oral Tube size: 7.5 mm Number of attempts: 1 Airway Equipment and Method: Stylet Placement Confirmation: ETT inserted through vocal cords under direct vision,  positive ETCO2 and breath sounds checked- equal and bilateral Secured at: 23 cm Tube secured with: Tape Dental Injury: Teeth and Oropharynx as per pre-operative assessment

## 2019-05-11 NOTE — Op Note (Signed)
OPERATIVE NOTE   PROCEDURE: left radiocephalic arteriovenous fistula placement  PRE-OPERATIVE DIAGNOSIS: End Stage Renal Disease  POST-OPERATIVE DIAGNOSIS: End Stage Renal Disease  SURGEON: [name]  ASSISTANT(S): Ms. Hezzie Bump  ANESTHESIA: general  ESTIMATED BLOOD LOSS: <50 cc  FINDING(S): 3 mm cephalic vein 2 mm radial artery  SPECIMEN(S):  none  INDICATIONS:   [name] is a 48 y.o. male who presents with end stage renal disease.  The patient is scheduled for left brachiocephalic arteriovenous fistula placement.  The patient is aware the risks include but are not limited to: bleeding, infection, steal syndrome, nerve damage, ischemic monomelic neuropathy, failure to mature, and need for additional procedures.  The patient is aware of the risks of the procedure and elects to proceed forward.  DESCRIPTION: After full informed written consent was obtained from the patient, the patient was brought back to the operating room and placed supine upon the operating table.  Prior to induction, the patient received IV antibiotics.   After obtaining adequate anesthesia, the patient was then prepped and draped in the standard fashion for a left arm access procedure.   A first assistant was required to provide a safe and appropriate environment for executing the surgery.  The assistant was integral in providing retraction, exposure, running suture providing suction and in the closing process.   A linear incision was then created midway between the radial impulse and the cephalic vein. The cephalic vein was then identified and dissected circumferentially. It was marked with a surgical marker.    Attention was then turned to the radial artery which was exposed through the same incision and looped proximally and distally. Side branches were controlled with 4-0 silk ties.  The distal segment of the vein was ligated with a  2-0 silk, and the vein was transected.  The  proximal segment was interrogated with serial dilators.  The vein accepted up to a 3.0 mm dilator without any difficulty. Heparinized saline was infused into the vein and clamped it with a small bulldog.  At this point, I reset my exposure of the brachial artery and controlled the artery with vessel loops proximally and distally.  An arteriotomy was then made with a #11 blade, and extended with a Potts scissor.  Heparinized saline was injected proximal and distal into the radial artery.  The vein was then approximated to the artery while the artery was in its native bed and subsequently the vein was beveled using Potts scissors. The vein was then sewn to the artery in an end-to-side configuration with a running stitch of 6-0 Prolene.  Prior to completing this anastomosis Flushing maneuvers were performed and the artery was allowed to forward and back bleed.  There was no evidence of clot from any vessels.  I completed the anastomosis in the usual fashion and then released all vessel loops and clamps.    There was good  thrill in the venous outflow, and there was 1+ palpable radial pulse.  At this point, I irrigated out the surgical wound.  There was no further active bleeding.  The subcutaneous tissue was reapproximated with a running stitch of 3-0 Vicryl.  The skin was then reapproximated with a running subcuticular stitch of 4-0 Vicryl.  The skin was then cleaned, dried, and reinforced with Dermabond.    The patient tolerated this procedure well.   COMPLICATIONS: None  CONDITION: [name]  Office: [phone]   05/11/2019, 9:43 AM

## 2019-06-06 ENCOUNTER — Other Ambulatory Visit (INDEPENDENT_AMBULATORY_CARE_PROVIDER_SITE_OTHER): Payer: Self-pay | Admitting: Vascular Surgery

## 2019-06-06 DIAGNOSIS — Z9889 Other specified postprocedural states: Secondary | ICD-10-CM

## 2019-06-06 DIAGNOSIS — N186 End stage renal disease: Secondary | ICD-10-CM

## 2019-06-07 ENCOUNTER — Other Ambulatory Visit: Payer: Self-pay

## 2019-06-07 ENCOUNTER — Ambulatory Visit (INDEPENDENT_AMBULATORY_CARE_PROVIDER_SITE_OTHER): Payer: Medicare Other | Admitting: Vascular Surgery

## 2019-06-07 ENCOUNTER — Encounter (INDEPENDENT_AMBULATORY_CARE_PROVIDER_SITE_OTHER): Payer: Self-pay | Admitting: Vascular Surgery

## 2019-06-07 ENCOUNTER — Ambulatory Visit (INDEPENDENT_AMBULATORY_CARE_PROVIDER_SITE_OTHER): Payer: Medicare Other

## 2019-06-07 VITALS — BP 172/84 | HR 79 | Resp 12 | Ht 76.0 in | Wt 369.0 lb

## 2019-06-07 DIAGNOSIS — T829XXA Unspecified complication of cardiac and vascular prosthetic device, implant and graft, initial encounter: Secondary | ICD-10-CM | POA: Insufficient documentation

## 2019-06-07 DIAGNOSIS — Z9889 Other specified postprocedural states: Secondary | ICD-10-CM | POA: Diagnosis not present

## 2019-06-07 DIAGNOSIS — T829XXS Unspecified complication of cardiac and vascular prosthetic device, implant and graft, sequela: Secondary | ICD-10-CM

## 2019-06-07 DIAGNOSIS — N186 End stage renal disease: Secondary | ICD-10-CM

## 2019-06-07 NOTE — Progress Notes (Signed)
Patient ID: [name], male   DOB: 1971-07-05, 48 y.o.   MRN: [id number]  Chief Complaint  Patient presents with  . Follow-up    HPI [name] is a 48 y.o. male.    The patient is s/p left radiocephalic arteriovenous fistula placement  No c/o denies incisional or hand pain  Past Medical History:  Diagnosis Date  . Anxiety   . Chronic kidney disease    14% FUNCTION  . Depression   . Diabetes mellitus without complication ([place])   . Dyspnea    DOE  . Dysrhythmia   . History of orthopnea   . Hypertension   . Lymphedema of leg   . Microalbuminuria   . Sleep apnea    no CPAP  . Vitamin D deficiency     Past Surgical History:  Procedure Laterality Date  . A/V FISTULAGRAM Right 10/24/2018   Procedure: A/V FISTULAGRAM;  Surgeon: Katha Cabal, MD;  Location: [place] [facility];  Service: Cardiovascular;  Laterality: Right;  . A/V FISTULAGRAM Right 11/24/2018   Procedure: A/V FISTULAGRAM;  Surgeon: Katha Cabal, MD;  Location: [place] [facility];  Service: Cardiovascular;  Laterality: Right;  . A/V SHUNT INTERVENTION Right 02/21/2019   Procedure: A/V SHUNT INTERVENTION;  Surgeon: Katha Cabal, MD;  Location: [place] [facility];  Service: Cardiovascular;  Laterality: Right;  . APPLICATION OF WOUND VAC Left 10/03/2017   Procedure: APPLICATION OF WOUND VAC;  Surgeon: Algernon Huxley, MD;  Location: [facility];  Service: General;  Laterality: Left;  . APPLICATION OF WOUND VAC Left 10/11/2017   Procedure: APPLICATION OF WOUND VAC;  Surgeon: Katha Cabal, MD;  Location: [facility];  Service: Vascular;  Laterality: Left;  . APPLICATION OF WOUND VAC Left 10/14/2017   Procedure: WOUND VAC CHANGE;  Surgeon: Katha Cabal, MD;  Location: [facility];  Service: Vascular;  Laterality: Left;  left lower leg  . APPLICATION OF WOUND VAC Left 10/18/2017   Procedure: WOUND VAC CHANGE;  Surgeon: Katha Cabal, MD;  Location: [facility];  Service: Vascular;   Laterality: Left;  . APPLICATION OF WOUND VAC Left 10/07/2017   Procedure: APPLICATION OF WOUND VAC;  Surgeon: Katha Cabal, MD;  Location: [facility];  Service: Vascular;  Laterality: Left;  . AV FISTULA INSERTION W/ RF MAGNETIC GUIDANCE Right 07/19/2018   Procedure: AV FISTULA INSERTION W/RF MAGNETIC GUIDANCE;  Surgeon: Katha Cabal, MD;  Location: [place] [facility];  Service: Cardiovascular;  Laterality: Right;  . AV FISTULA PLACEMENT Left 05/11/2019   Procedure: ARTERIOVENOUS (AV) FISTULA CREATION (RADIOCEPHALIC );  Surgeon: Katha Cabal, MD;  Location: [facility];  Service: Vascular;  Laterality: Left;  . CATARACT EXTRACTION W/PHACO Left 11/01/2018   Procedure: CATARACT EXTRACTION PHACO AND INTRAOCULAR LENS PLACEMENT (IOC)-LEFT, DIABETIC-INSULIN DEPENDENT;  Surgeon: Eulogio Bear, MD;  Location: [facility];  Service: Ophthalmology;  Laterality: Left;  [place] 00:41.8 CDE 4.61 Fluid Pack Lot # [id number]  . CATARACT EXTRACTION W/PHACO Right 04/27/2019   Procedure: CATARACT EXTRACTION PHACO AND INTRAOCULAR LENS PLACEMENT (IOC);  Surgeon: Eulogio Bear, MD;  Location: [facility];  Service: Ophthalmology;  Laterality: Right;  [place] 00:34 CDE 1.97 FLUID PACK LOT # [id number]   . DIALYSIS/PERMA CATHETER INSERTION N/A 02/23/2019   Procedure: DIALYSIS/PERMA CATHETER INSERTION;  Surgeon: Algernon Huxley, MD;  Location: [place] [facility];  Service: Cardiovascular;  Laterality: N/A;  . I&D EXTREMITY Left 10/11/2017   Procedure: IRRIGATION AND DEBRIDEMENT  EXTREMITY;  Surgeon: Katha Cabal, MD;  Location: [facility];  Service: Vascular;  Laterality: Left;  . INCISION AND DRAINAGE ABSCESS Right 10/07/2017   Procedure: INCISION AND DRAINAGE ABSCESS;  Surgeon: Katha Cabal, MD;  Location: [facility];  Service: Vascular;  Laterality: Right;  . IRRIGATION AND DEBRIDEMENT ABSCESS Left 10/03/2017   Procedure: IRRIGATION AND DEBRIDEMENT ABSCESS with debridement of skin, soft tissue, muscle 50sq cm;   Surgeon: Algernon Huxley, MD;  Location: [facility];  Service: General;  Laterality: Left;  . TEMPORARY DIALYSIS CATHETER  02/20/2019   Procedure: TEMPORARY DIALYSIS CATHETER;  Surgeon: Katha Cabal, MD;  Location: [place] [facility];  Service: Cardiovascular;;      Allergies  Allergen Reactions  . Lisinopril Swelling  . Adhesive [Tape] Itching  . Furosemide Cough    Light headed, blurry vision, weakness.    Current Outpatient Medications  Medication Sig Dispense Refill  . acetaminophen (TYLENOL) 500 MG tablet Take 500 mg by mouth every 6 (six) hours as needed for moderate pain or headache.    [name] apixaban (ELIQUIS) 5 MG TABS tablet Take 5 mg by mouth 2 (two) times daily.     . Ascorbic Acid (VITAMIN C WITH ROSE HIPS) 1000 MG tablet Take 1,000 mg by mouth every other day.     . calcium acetate (PHOSLO) 667 MG capsule Take 667-1,334 mg by mouth See admin instructions. Take 1334 mg with each meal and 667 mg with snacks    . CINNAMON PO Take 1,000 mg by mouth 2 (two) times daily.    . Insulin Glargine (LANTUS SOLOSTAR) 100 UNIT/ML Solostar Pen Inject 14 Units into the skin 2 (two) times daily.     [name] lidocaine-prilocaine (EMLA) cream Apply 1 application topically as needed (for fistula).    . loratadine (CLARITIN) 10 MG tablet Take 10 mg by mouth daily as needed for allergies.    . Melatonin 10 MG CAPS Take 10 mg by mouth at bedtime as needed (sleep).    . metolazone (ZAROXOLYN) 5 MG tablet Take 5 mg by mouth every Monday, Wednesday, and Friday.     . Multiple Vitamin (MULTIVITAMIN WITH MINERALS) TABS tablet Take 1 tablet by mouth every other day.     . Multiple Vitamins-Minerals (EYE HEALTH PO) Take 1 capsule by mouth daily.     . Omega-3 1000 MG CAPS Take 1,000 mg by mouth daily.     No current facility-administered medications for this visit.         Physical Exam BP (!) 172/84 (BP Location: Right Wrist, Patient Position: Sitting, Cuff Size: Normal)   Pulse 79   Resp 12   Ht  6\' 4"  (1.93 m)   Wt (!) 369 lb (167.4 kg)   BMI 44.92 kg/m  Gen:  WD/WN, NAD Skin: incision C/D/I; good thrill good bruit     Assessment/Plan: 1. End stage renal disease ([place]) At the present time the patient has adequate dialysis access.  Continue hemodialysis as ordered without interruption.  Avoid nephrotoxic medications and dehydration.  Further plans per nephrology  2. Complication of vascular access for dialysis, sequela At the present time the patient has adequate dialysis access.  Continue hemodialysis as ordered without interruption.  Avoid nephrotoxic medications and dehydration.  Further plans per nephrology      [name] 06/07/2019, 3:10 PM   This note was created with Dragon medical transcription system.  Any errors from dictation are unintentional.

## 2019-06-14 ENCOUNTER — Ambulatory Visit (INDEPENDENT_AMBULATORY_CARE_PROVIDER_SITE_OTHER): Payer: BLUE CROSS/BLUE SHIELD | Admitting: Vascular Surgery

## 2019-06-14 ENCOUNTER — Encounter (INDEPENDENT_AMBULATORY_CARE_PROVIDER_SITE_OTHER): Payer: BLUE CROSS/BLUE SHIELD

## 2019-07-12 ENCOUNTER — Ambulatory Visit (INDEPENDENT_AMBULATORY_CARE_PROVIDER_SITE_OTHER): Payer: Medicare Other | Admitting: Vascular Surgery

## 2019-07-19 ENCOUNTER — Other Ambulatory Visit: Payer: Self-pay

## 2019-07-19 ENCOUNTER — Encounter (INDEPENDENT_AMBULATORY_CARE_PROVIDER_SITE_OTHER): Payer: Self-pay | Admitting: Nurse Practitioner

## 2019-07-19 ENCOUNTER — Ambulatory Visit (INDEPENDENT_AMBULATORY_CARE_PROVIDER_SITE_OTHER): Payer: Medicare Other | Admitting: Nurse Practitioner

## 2019-07-19 VITALS — BP 147/73 | HR 84 | Resp 16 | Ht 76.0 in | Wt 372.0 lb

## 2019-07-19 DIAGNOSIS — I10 Essential (primary) hypertension: Secondary | ICD-10-CM | POA: Diagnosis not present

## 2019-07-19 DIAGNOSIS — T829XXS Unspecified complication of cardiac and vascular prosthetic device, implant and graft, sequela: Secondary | ICD-10-CM

## 2019-07-19 DIAGNOSIS — E119 Type 2 diabetes mellitus without complications: Secondary | ICD-10-CM | POA: Diagnosis not present

## 2019-07-20 ENCOUNTER — Telehealth (INDEPENDENT_AMBULATORY_CARE_PROVIDER_SITE_OTHER): Payer: Self-pay

## 2019-07-20 NOTE — Telephone Encounter (Signed)
Spoke with the patient and he is now scheduled with Dr. Delana Meyer for a fistulagram on 07/31/2019 with a 9:00 am arrival time to the [facility]. Patient will do his Covid testing on 07/27/2019 between 12:30-2:30 pm at the [facility]. Pre-procedure instructions were discussed and will be mailed to the patient.

## 2019-07-22 ENCOUNTER — Encounter (INDEPENDENT_AMBULATORY_CARE_PROVIDER_SITE_OTHER): Payer: Self-pay | Admitting: Nurse Practitioner

## 2019-07-22 NOTE — Progress Notes (Signed)
SUBJECTIVE:  Patient ID: [name], male    DOB: Sep 09, 1970, 48 y.o.   MRN: [id number] Chief Complaint  Patient presents with  . Follow-up    no studies    HPI  [name] is a 48 y.o. male that presents today for follow-up after placement of his radiocephalic AV fistula.  Previous studies show a patent radiocephalic AV fistula with no evidence of stenosis however there were multiple patent branches found.  At this time the patient has a good thrill and bruit however there is no palpable fistula present on his wrist.  This would be extremely difficult to cannulate.  Past Medical History:  Diagnosis Date  . Anxiety   . Chronic kidney disease    14% FUNCTION  . Depression   . Diabetes mellitus without complication ([place])   . Dyspnea    DOE  . Dysrhythmia   . History of orthopnea   . Hypertension   . Lymphedema of leg   . Microalbuminuria   . Sleep apnea    no CPAP  . Vitamin D deficiency     Past Surgical History:  Procedure Laterality Date  . A/V FISTULAGRAM Right 10/24/2018   Procedure: A/V FISTULAGRAM;  Surgeon: Katha Cabal, MD;  Location: [place] [facility];  Service: Cardiovascular;  Laterality: Right;  . A/V FISTULAGRAM Right 11/24/2018   Procedure: A/V FISTULAGRAM;  Surgeon: Katha Cabal, MD;  Location: [place] [facility];  Service: Cardiovascular;  Laterality: Right;  . A/V SHUNT INTERVENTION Right 02/21/2019   Procedure: A/V SHUNT INTERVENTION;  Surgeon: Katha Cabal, MD;  Location: [place] [facility];  Service: Cardiovascular;  Laterality: Right;  . APPLICATION OF WOUND VAC Left 10/03/2017   Procedure: APPLICATION OF WOUND VAC;  Surgeon: Algernon Huxley, MD;  Location: [facility];  Service: General;  Laterality: Left;  . APPLICATION OF WOUND VAC Left 10/11/2017   Procedure: APPLICATION OF WOUND VAC;  Surgeon: Katha Cabal, MD;  Location: [facility];  Service: Vascular;  Laterality: Left;  . APPLICATION OF WOUND VAC Left 10/14/2017   Procedure: WOUND VAC CHANGE;  Surgeon: Katha Cabal, MD;  Location: [facility];  Service: Vascular;  Laterality: Left;  left lower leg  . APPLICATION OF WOUND VAC Left 10/18/2017   Procedure: WOUND VAC CHANGE;  Surgeon: Katha Cabal, MD;  Location: [facility];  Service: Vascular;  Laterality: Left;  . APPLICATION OF WOUND VAC Left 10/07/2017   Procedure: APPLICATION OF WOUND VAC;  Surgeon: Katha Cabal, MD;  Location: [facility];  Service: Vascular;  Laterality: Left;  . AV FISTULA INSERTION W/ RF MAGNETIC GUIDANCE Right 07/19/2018   Procedure: AV FISTULA INSERTION W/RF MAGNETIC GUIDANCE;  Surgeon: Katha Cabal, MD;  Location: [place] [facility];  Service: Cardiovascular;  Laterality: Right;  . AV FISTULA PLACEMENT Left 05/11/2019   Procedure: ARTERIOVENOUS (AV) FISTULA CREATION (RADIOCEPHALIC );  Surgeon: Katha Cabal, MD;  Location: [facility];  Service: Vascular;  Laterality: Left;  . CATARACT EXTRACTION W/PHACO Left 11/01/2018   Procedure: CATARACT EXTRACTION PHACO AND INTRAOCULAR LENS PLACEMENT (IOC)-LEFT, DIABETIC-INSULIN DEPENDENT;  Surgeon: Eulogio Bear, MD;  Location: [facility];  Service: Ophthalmology;  Laterality: Left;  [place] 00:41.8 CDE 4.61 Fluid Pack Lot # [id number]  . CATARACT EXTRACTION W/PHACO Right 04/27/2019   Procedure: CATARACT EXTRACTION PHACO AND INTRAOCULAR LENS PLACEMENT (IOC);  Surgeon: Eulogio Bear, MD;  Location: [facility];  Service: Ophthalmology;  Laterality: Right;  [place] 00:34 CDE 1.97  FLUID PACK LOT # [id number]   . DIALYSIS/PERMA CATHETER INSERTION N/A 02/23/2019   Procedure: DIALYSIS/PERMA CATHETER INSERTION;  Surgeon: Algernon Huxley, MD;  Location: [place] [facility];  Service: Cardiovascular;  Laterality: N/A;  . I&D EXTREMITY Left 10/11/2017   Procedure: IRRIGATION AND DEBRIDEMENT EXTREMITY;  Surgeon: Katha Cabal, MD;  Location: [facility];  Service: Vascular;  Laterality: Left;  . INCISION AND DRAINAGE ABSCESS Right 10/07/2017    Procedure: INCISION AND DRAINAGE ABSCESS;  Surgeon: Katha Cabal, MD;  Location: [facility];  Service: Vascular;  Laterality: Right;  . IRRIGATION AND DEBRIDEMENT ABSCESS Left 10/03/2017   Procedure: IRRIGATION AND DEBRIDEMENT ABSCESS with debridement of skin, soft tissue, muscle 50sq cm;  Surgeon: Algernon Huxley, MD;  Location: [facility];  Service: General;  Laterality: Left;  . TEMPORARY DIALYSIS CATHETER  02/20/2019   Procedure: TEMPORARY DIALYSIS CATHETER;  Surgeon: Katha Cabal, MD;  Location: [place] [facility];  Service: Cardiovascular;;    Social History   Socioeconomic History  . Marital status: Divorced    Spouse name: Not on file  . Number of children: Not on file  . Years of education: Not on file  . Highest education level: Not on file  Occupational History  . Not on file  Social Needs  . Financial resource strain: Not on file  . Food insecurity    Worry: Not on file    Inability: Not on file  . Transportation needs    Medical: Not on file    Non-medical: Not on file  Tobacco Use  . Smoking status: Former Smoker    Types: Cigars  . Smokeless tobacco: Former Systems developer    Quit date: 09/30/2017  . Tobacco comment: occasional  Substance and Sexual Activity  . Alcohol use: Yes    Alcohol/week: 1.0 standard drinks    Types: 1 Cans of beer per week    Comment: beer  . Drug use: Not Currently  . Sexual activity: Not on file  Lifestyle  . Physical activity    Days per week: Not on file    Minutes per session: Not on file  . Stress: Not on file  Relationships  . Social Herbalist on phone: Not on file    Gets together: Not on file    Attends religious service: Not on file    Active member of club or organization: Not on file    Attends meetings of clubs or organizations: Not on file    Relationship status: Not on file  . Intimate partner violence    Fear of current or ex partner: Not on file    Emotionally abused: Not on file    Physically abused: Not  on file    Forced sexual activity: Not on file  Other Topics Concern  . Not on file  Social History Narrative  . Not on file    Family History  Problem Relation Age of Onset  . Diabetes Father   . Kidney disease Father   . Diabetes Daughter     Allergies  Allergen Reactions  . Lisinopril Swelling  . Adhesive [Tape] Itching  . Furosemide Cough    Light headed, blurry vision, weakness.     Review of Systems   Review of Systems: Negative Unless Checked Constitutional: [] Weight loss  [] Fever  [] Chills Cardiac: [] Chest pain   []  Atrial Fibrillation  [] Palpitations   [] Shortness of breath when laying flat   [] Shortness of breath with exertion. []   Shortness of breath at rest Vascular:  [] Pain in legs with walking   [] Pain in legs with standing [] Pain in legs when laying flat   [] Claudication    [] Pain in feet when laying flat    [] History of DVT   [] Phlebitis   [x] Swelling in legs   [] Varicose veins   [] Non-healing ulcers Pulmonary:   [] Uses home oxygen   [] Productive cough   [] Hemoptysis   [] Wheeze  [] COPD   [] Asthma Neurologic:  [] Dizziness   [] Seizures  [] Blackouts [] History of stroke   [] History of TIA  [] Aphasia   [] Temporary Blindness   [] Weakness or numbness in arm   [] Weakness or numbness in leg Musculoskeletal:   [] Joint swelling   [] Joint pain   [] Low back pain  []  History of Knee Replacement [] Arthritis [] back Surgeries  []  Spinal Stenosis    Hematologic:  [] Easy bruising  [] Easy bleeding   [] Hypercoagulable state   [x] Anemic Gastrointestinal:  [] Diarrhea   [] Vomiting  [] Gastroesophageal reflux/heartburn   [] Difficulty swallowing. [] Abdominal pain Genitourinary:  [x] Chronic kidney disease   [] Difficult urination  [] Anuric   [] Blood in urine [] Frequent urination  [] Burning with urination   [] Hematuria Skin:  [] Rashes   [] Ulcers [] Wounds Psychological:  [] History of anxiety   []  History of major depression  []  Memory Difficulties      OBJECTIVE:   Physical Exam  BP (!)  147/73 (BP Location: Right Arm)   Pulse 84   Resp 16   Ht 6\' 4"  (8.84 m)   Wt (!) 372 lb (168.7 kg)   BMI 45.28 kg/m   Gen: WD/WN, NAD Head: [place]/AT, No temporalis wasting.  Ear/Nose/Throat: Hearing grossly intact, nares w/o erythema or drainage Eyes: PER, EOMI, sclera nonicteric.  Neck: Supple, no masses.  No JVD.  Pulmonary:  Good air movement, no use of accessory muscles.  Cardiac: RRR Vascular:  Good thrill and bruit present Vessel Right Left  Radial Palpable Palpable   Gastrointestinal: soft, non-distended. No guarding/no peritoneal signs.  Musculoskeletal: M/S 5/5 throughout.  No deformity or atrophy.  Neurologic: Pain and light touch intact in extremities.  Symmetrical.  Speech is fluent. Motor exam as listed above. Psychiatric: Judgment intact, Mood & affect appropriate for pt's clinical situation. Dermatologic: No Venous rashes. No Ulcers Noted.  No changes consistent with cellulitis. Lymph : No Cervical lymphadenopathy, no lichenification or skin changes of chronic lymphedema.       ASSESSMENT AND PLAN:  1. Complication of vascular access for dialysis, sequela Recommend:  The patient is experiencing increasing problems with their dialysis access.  Patient should have a fistulagram with the intention for intervention.  The intention for intervention is to restore appropriate flow with possible balloon assisted maturation and possible embolization of the multiple patent branches.  The risks, benefits and alternative therapies were reviewed in detail with the patient.  All questions were answered.  The patient agrees to proceed with angio/intervention.      2. Diabetes mellitus without complication ([place]) Continue hypoglycemic medications as already ordered, these medications have been reviewed and there are no changes at this time.  Hgb A1C to be monitored as already arranged by primary service   3. Essential hypertension Continue antihypertensive medications as  already ordered, these medications have been reviewed and there are no changes at this time.    Current Outpatient Medications on File Prior to Visit  Medication Sig Dispense Refill  . acetaminophen (TYLENOL) 500 MG tablet Take 500 mg by mouth every 6 (six) hours as needed for  moderate pain or headache.    [name] apixaban (ELIQUIS) 5 MG TABS tablet Take 5 mg by mouth 2 (two) times daily.     . Ascorbic Acid (VITAMIN C WITH ROSE HIPS) 1000 MG tablet Take 1,000 mg by mouth every other day.     . calcium acetate (PHOSLO) 667 MG capsule Take 667-1,334 mg by mouth See admin instructions. Take 1334 mg with each meal and 667 mg with snacks    . CINNAMON PO Take 1,000 mg by mouth 2 (two) times daily.    . Insulin Glargine (LANTUS SOLOSTAR) 100 UNIT/ML Solostar Pen Inject 16 Units into the skin 2 (two) times daily.     [name] lidocaine-prilocaine (EMLA) cream Apply 1 application topically as needed (for fistula).    . loratadine (CLARITIN) 10 MG tablet Take 10 mg by mouth daily as needed for allergies.    . Melatonin 10 MG CAPS Take 10 mg by mouth at bedtime as needed (sleep).    . metolazone (ZAROXOLYN) 5 MG tablet Take 5 mg by mouth every Monday, Wednesday, and Friday.     . Multiple Vitamin (MULTIVITAMIN WITH MINERALS) TABS tablet Take 1 tablet by mouth every other day.     . Multiple Vitamins-Minerals (EYE HEALTH PO) Take 1 capsule by mouth daily.     . Omega-3 1000 MG CAPS Take 1,000 mg by mouth daily.    [name] ibuprofen (ADVIL) 800 MG tablet Take 800 mg by mouth every 8 (eight) hours as needed for moderate pain.      No current facility-administered medications on file prior to visit.     There are no Patient Instructions on file for this visit. No follow-ups on file.   Kris Hartmann, NP  This note was completed with Sales executive.  Any errors are purely unintentional.

## 2019-07-27 ENCOUNTER — Other Ambulatory Visit
Admission: RE | Admit: 2019-07-27 | Discharge: 2019-07-27 | Disposition: A | Discharge status: Home / Self Care | Payer: Medicare Other | Source: Ambulatory Visit | Attending: Vascular Surgery | Admitting: Vascular Surgery

## 2019-07-27 DIAGNOSIS — Z01812 Encounter for preprocedural laboratory examination: Secondary | ICD-10-CM | POA: Insufficient documentation

## 2019-07-27 DIAGNOSIS — Z20828 Contact with and (suspected) exposure to other viral communicable diseases: Secondary | ICD-10-CM | POA: Diagnosis not present

## 2019-07-27 LAB — SARS CORONAVIRUS 2 (TAT 6-24 HRS): SARS Coronavirus 2: NEGATIVE

## 2019-07-31 ENCOUNTER — Ambulatory Visit
Admission: RE | Admit: 2019-07-31 | Discharge: 2019-07-31 | Disposition: A | Discharge status: Home / Self Care | Payer: Medicare Other | Attending: Vascular Surgery | Admitting: Vascular Surgery

## 2019-07-31 ENCOUNTER — Other Ambulatory Visit: Payer: Self-pay

## 2019-07-31 ENCOUNTER — Encounter
Admission: RE | Disposition: A | Discharge status: Home / Self Care | Payer: Self-pay | Source: Home / Self Care | Attending: Vascular Surgery

## 2019-07-31 ENCOUNTER — Other Ambulatory Visit (INDEPENDENT_AMBULATORY_CARE_PROVIDER_SITE_OTHER): Payer: Self-pay | Admitting: Nurse Practitioner

## 2019-07-31 DIAGNOSIS — Z794 Long term (current) use of insulin: Secondary | ICD-10-CM | POA: Insufficient documentation

## 2019-07-31 DIAGNOSIS — Z79899 Other long term (current) drug therapy: Secondary | ICD-10-CM | POA: Diagnosis not present

## 2019-07-31 DIAGNOSIS — G4730 Sleep apnea, unspecified: Secondary | ICD-10-CM | POA: Diagnosis not present

## 2019-07-31 DIAGNOSIS — T82898A Other specified complication of vascular prosthetic devices, implants and grafts, initial encounter: Secondary | ICD-10-CM | POA: Diagnosis not present

## 2019-07-31 DIAGNOSIS — Z87891 Personal history of nicotine dependence: Secondary | ICD-10-CM | POA: Insufficient documentation

## 2019-07-31 DIAGNOSIS — E1122 Type 2 diabetes mellitus with diabetic chronic kidney disease: Secondary | ICD-10-CM | POA: Diagnosis not present

## 2019-07-31 DIAGNOSIS — Z7901 Long term (current) use of anticoagulants: Secondary | ICD-10-CM | POA: Diagnosis not present

## 2019-07-31 DIAGNOSIS — F329 Major depressive disorder, single episode, unspecified: Secondary | ICD-10-CM | POA: Diagnosis not present

## 2019-07-31 DIAGNOSIS — N186 End stage renal disease: Secondary | ICD-10-CM | POA: Insufficient documentation

## 2019-07-31 DIAGNOSIS — T82858S Stenosis of vascular prosthetic devices, implants and grafts, sequela: Secondary | ICD-10-CM | POA: Insufficient documentation

## 2019-07-31 DIAGNOSIS — Y841 Kidney dialysis as the cause of abnormal reaction of the patient, or of later complication, without mention of misadventure at the time of the procedure: Secondary | ICD-10-CM | POA: Insufficient documentation

## 2019-07-31 DIAGNOSIS — I120 Hypertensive chronic kidney disease with stage 5 chronic kidney disease or end stage renal disease: Secondary | ICD-10-CM | POA: Insufficient documentation

## 2019-07-31 DIAGNOSIS — Z992 Dependence on renal dialysis: Secondary | ICD-10-CM | POA: Insufficient documentation

## 2019-07-31 HISTORY — PX: A/V FISTULAGRAM: CATH118298

## 2019-07-31 LAB — GLUCOSE, CAPILLARY: Glucose-Capillary: 157 mg/dL — ABNORMAL HIGH (ref 70–99)

## 2019-07-31 LAB — POTASSIUM (ARMC VASCULAR LAB ONLY): Potassium (ARMC vascular lab): 5 (ref 3.5–5.1)

## 2019-07-31 SURGERY — A/V FISTULAGRAM
Anesthesia: Moderate Sedation | Laterality: Left

## 2019-07-31 MED ORDER — FENTANYL CITRATE (PF) 100 MCG/2ML IJ SOLN
INTRAMUSCULAR | Status: DC | PRN
  Administered 2019-07-31 (×2): 50 ug via INTRAVENOUS

## 2019-07-31 MED ORDER — FAMOTIDINE 20 MG PO TABS: 40.0000 mg | ORAL_TABLET | Freq: Once | ORAL | Status: DC | PRN

## 2019-07-31 MED ORDER — FENTANYL CITRATE (PF) 100 MCG/2ML IJ SOLN
INTRAMUSCULAR | Status: AC
  Filled 2019-07-31: qty 2

## 2019-07-31 MED ORDER — HEPARIN SODIUM (PORCINE) 1000 UNIT/ML IJ SOLN
INTRAMUSCULAR | Status: AC
  Filled 2019-07-31: qty 1

## 2019-07-31 MED ORDER — MIDAZOLAM HCL 2 MG/ML PO SYRP: 8.0000 mg | ORAL_SOLUTION | Freq: Once | ORAL | Status: DC | PRN

## 2019-07-31 MED ORDER — METHYLPREDNISOLONE SODIUM SUCC 125 MG IJ SOLR: 125.0000 mg | Freq: Once | INTRAMUSCULAR | Status: DC | PRN

## 2019-07-31 MED ORDER — HEPARIN SODIUM (PORCINE) 1000 UNIT/ML IJ SOLN
INTRAMUSCULAR | Status: DC | PRN
  Administered 2019-07-31: 4000 [IU] via INTRAVENOUS

## 2019-07-31 MED ORDER — HYDROMORPHONE HCL 1 MG/ML IJ SOLN: 1.0000 mg | Freq: Once | INTRAMUSCULAR | Status: DC | PRN

## 2019-07-31 MED ORDER — IODIXANOL 320 MG/ML IV SOLN
INTRAVENOUS | Status: DC | PRN
  Administered 2019-07-31: 20 mL via INTRA_ARTERIAL

## 2019-07-31 MED ORDER — DIPHENHYDRAMINE HCL 50 MG/ML IJ SOLN: 50.0000 mg | Freq: Once | INTRAMUSCULAR | Status: DC | PRN

## 2019-07-31 MED ORDER — CEFAZOLIN SODIUM-DEXTROSE 1-4 GM/50ML-% IV SOLN
1.0000 g | Freq: Once | INTRAVENOUS | Status: AC
  Administered 2019-07-31: 10:00:00 1 g via INTRAVENOUS

## 2019-07-31 MED ORDER — CEFAZOLIN SODIUM-DEXTROSE 1-4 GM/50ML-% IV SOLN
INTRAVENOUS | Status: AC
  Administered 2019-07-31: 10:00:00 1 g via INTRAVENOUS
  Filled 2019-07-31: qty 50

## 2019-07-31 MED ORDER — MIDAZOLAM HCL 5 MG/5ML IJ SOLN
INTRAMUSCULAR | Status: AC
  Filled 2019-07-31: qty 5

## 2019-07-31 MED ORDER — SODIUM CHLORIDE 0.9 % IV SOLN: INTRAVENOUS | Status: DC

## 2019-07-31 MED ORDER — MIDAZOLAM HCL 2 MG/2ML IJ SOLN
INTRAMUSCULAR | Status: DC | PRN
  Administered 2019-07-31: 1 mg via INTRAVENOUS
  Administered 2019-07-31: 2 mg via INTRAVENOUS

## 2019-07-31 MED ORDER — ONDANSETRON HCL 4 MG/2ML IJ SOLN: 4.0000 mg | Freq: Four times a day (QID) | INTRAMUSCULAR | Status: DC | PRN

## 2019-07-31 SURGICAL SUPPLY — 13 items
CATH BEACON 5 .035 65 KMP TIP (CATHETERS) ×3 IMPLANT
CATH MICROCATH PRGRT 2.8F 110 (CATHETERS) ×1 IMPLANT
COVER PROBE U/S 5X48 (MISCELLANEOUS) ×3 IMPLANT
DEVICE PRESTO INFLATION (MISCELLANEOUS) ×3 IMPLANT
DEVICE TORQUE .025-.038 (MISCELLANEOUS) ×3 IMPLANT
GUIDEWIRE ADV .018X180CM (WIRE) ×3 IMPLANT
GUIDEWIRE ANGLED .035 180CM (WIRE) ×3 IMPLANT
MICROCATH PROGREAT 2.8F 110 CM (CATHETERS) ×3
NEEDLE ENTRY 21GA 7CM ECHOTIP (NEEDLE) ×3 IMPLANT
PACK ANGIOGRAPHY (CUSTOM PROCEDURE TRAY) ×3 IMPLANT
SET INTRO CAPELLA COAXIAL (SET/KITS/TRAYS/PACK) ×3 IMPLANT
SHEATH BRITE TIP 6FRX5.5 (SHEATH) ×6 IMPLANT
SUT MNCRL AB 4-0 PS2 18 (SUTURE) ×3 IMPLANT

## 2019-07-31 NOTE — H&P (Signed)
[facility] History & Physical Update  The patient was interviewed and re-examined.  The patient's previous History and Physical has been reviewed and is unchanged.  There is no change in the plan of care. We plan to proceed with the scheduled procedure.  [name], MD  07/31/2019, 9:28 AM

## 2019-07-31 NOTE — Op Note (Signed)
OPERATIVE NOTE   PROCEDURE: 1. Contrast injection left radiocephalic fistula. 2. Ultrasound guided placement of 6 French sheath into left wrist fistula 3. Ultrasound guided placement of a 5 French micro sheath into mid forearm vein  PRE-OPERATIVE DIAGNOSIS: Complication of dialysis access                                                       End Stage Renal Disease  POST-OPERATIVE DIAGNOSIS: same as above   SURGEON: Katha Cabal, M.D.  ANESTHESIA: Conscious sedation was administered under my direct supervision by the interventional radiology RN.  IV Versed plus fentanyl were utilized. Continuous ECG, pulse oximetry and blood pressure was monitored throughout the entire procedure.  Conscious sedation was for a total of 60 minutes.  ESTIMATED BLOOD LOSS: minimal  FINDING(S): 1. There are numerous large tributaries located fairly proximal in the fistula that are likely hampering further maturation.  There is a greater than 80% stenosis of the radial artery just proximal to the anastomosis that likely represents a clamp injury and is clearly flow-limiting  SPECIMEN(S):  None  CONTRAST: 50 cc  FLUOROSCOPY TIME: 12 minutes  INDICATIONS: [name] is a 48 y.o. male who  presents with malfunctioning left wrist AV access.  The patient is scheduled for angiography with possible intervention of the AV access to prevent loss of the permanent access.  The patient is aware the risks include but are not limited to: bleeding, infection, thrombosis of the cannulated access, and possible anaphylactic reaction to the contrast.  The patient acknowledges if the access can not be salvaged a tunneled catheter will be needed and will be placed during this procedure.  The patient is aware of the risks of the procedure and elects to proceed with the angiogram and intervention.  DESCRIPTION: After full informed written consent was obtained, the patient was brought back to the Special Procedure suite  and placed supine position.  Appropriate cardiopulmonary monitors were placed.  The left arm was prepped and draped in the standard fashion.  Appropriate timeout is called.   The access was then scanned from the anastomosis to the antecubital fossa.  There appears to be some narrowing at the anastomosis and therefore attempted retrograde access was performed.  Unfortunately just infiltrating lidocaine into the skin created intense spasm of the cephalic vein and placement of a sheath was not feasible.  The left wrist access was cannulated with a micropuncture needle under ultrasound guidence in the antegrade direction in the mid forearm.  Ultrasound was used to evaluate the left wrist access.  It was echolucent and compressible indicating it is patent .  An ultrasound image was acquired for the permanent record.  A micropuncture needle was used to access the left wrist access under direct ultrasound guidance.  The microwire was then advanced under fluoroscopic guidance without difficulty followed by the micro-sheath.  The J-wire was then advanced and a 6 Fr sheath inserted.  Hand injections were completed to image the access from the arterial anastomosis through the entire access.  The central venous structures were also imaged by hand injections.  Based on the images, there are numerous tributaries near the wrist which is noted above is likely hampering meaningful maturation proximally in the antecubital fossa there is now a long segment of spasm but there does not appear  to be any structural abnormality or stricture responsible for this.  I then used the ultrasound and followed one of the larger tributaries at the wrist more proximally and selected an area in the mid forearm posteriorly based on the ultrasound the vein was echolucent compressible indicating patency.  Image was recorded for the permanent record.  Lidocaine was infiltrated in the skin and a micropuncture needle was inserted.  Microwire was then  advanced.  Following this a microsheath was inserted.  Hand-injection of contrast through the microsheath then demonstrated the veins of the forearm.  Given this image I was able to negotiate a Glidewire into the cephalic vein and ultimately down to the arterial anastomosis KMP catheter was then advanced.  Hand-injection of contrast at the level of the anastomosis was performed.  This demonstrated the above-noted stricture.  At this point attempts with different wires and catheters to advanced the [place] through the stricture and more proximally in the radial artery were not successful.  They are just too many turns and the stricture is too tight which prevents advancing catheters.    Based on these findings I believe approaching the radial artery stenosis from either the retrograde approach with radial access or from femoral access would be the most appropriate way to treat this lesion.  At that time a catheter could be advanced into the fistula proper in the antegrade direction and at least the 2 largest tributaries could be coiled.  I will discuss radial artery access and femoral artery access with the patient this was not reviewed with him prior to this fistulogram and therefore I will terminate the procedure today review the images with him and make further arrangements for treatment.  The sheath was removed and light pressure was applied.  A sterile bandage was applied to the puncture site.    COMPLICATIONS: none   CONDITION: unchanged   Katha Cabal, M.D [facility] Office: [phone]  07/31/2019 11:26 AM

## 2019-08-16 ENCOUNTER — Encounter (INDEPENDENT_AMBULATORY_CARE_PROVIDER_SITE_OTHER): Payer: Self-pay | Admitting: Vascular Surgery

## 2019-08-16 ENCOUNTER — Ambulatory Visit (INDEPENDENT_AMBULATORY_CARE_PROVIDER_SITE_OTHER): Payer: Medicare Other | Admitting: Vascular Surgery

## 2019-08-16 ENCOUNTER — Other Ambulatory Visit: Payer: Self-pay

## 2019-08-16 VITALS — BP 174/82 | HR 88 | Resp 17 | Ht 76.0 in | Wt 376.0 lb

## 2019-08-16 DIAGNOSIS — T829XXS Unspecified complication of cardiac and vascular prosthetic device, implant and graft, sequela: Secondary | ICD-10-CM | POA: Diagnosis not present

## 2019-08-16 DIAGNOSIS — Z794 Long term (current) use of insulin: Secondary | ICD-10-CM

## 2019-08-16 DIAGNOSIS — I890 Lymphedema, not elsewhere classified: Secondary | ICD-10-CM

## 2019-08-16 DIAGNOSIS — I739 Peripheral vascular disease, unspecified: Secondary | ICD-10-CM | POA: Diagnosis not present

## 2019-08-16 DIAGNOSIS — I872 Venous insufficiency (chronic) (peripheral): Secondary | ICD-10-CM

## 2019-08-16 DIAGNOSIS — E1159 Type 2 diabetes mellitus with other circulatory complications: Secondary | ICD-10-CM

## 2019-08-16 DIAGNOSIS — M109 Gout, unspecified: Secondary | ICD-10-CM | POA: Insufficient documentation

## 2019-08-16 DIAGNOSIS — I10 Essential (primary) hypertension: Secondary | ICD-10-CM

## 2019-08-16 NOTE — Progress Notes (Signed)
MRN : [id number]  [name] is a 48 y.o. (04-30-1971) male who presents with chief complaint of No chief complaint on file. [name]  History of Present Illness:   The patient returns to the office for follow up regarding problem with the dialysis access. Currently the patient is maintained via a right IJ tunnel catheter the patient has had several failed upper extremity accesses.  The patient notes his left wrist fistula has not matured.  They have not been able to cannulate the cephalic vein.    The patient denies hand pain or other symptoms consistent with steal phenomena.  No significant arm swelling.  The patient denies redness or swelling at the access site. The patient denies fever or chills at home or while on dialysis.  The patient denies amaurosis fugax or recent TIA symptoms. There are no recent neurological changes noted. The patient denies claudication symptoms or rest pain symptoms. The patient denies history of DVT, PE or superficial thrombophlebitis. The patient denies recent episodes of angina or shortness of breath.      No outpatient medications have been marked as taking for the 08/16/19 encounter (Appointment) with Delana Meyer, Dolores Lory, MD.    Past Medical History:  Diagnosis Date  . Anxiety   . Chronic kidney disease    14% FUNCTION  . Depression   . Diabetes mellitus without complication ([place])   . Dyspnea    DOE  . Dysrhythmia   . History of orthopnea   . Hypertension   . Lymphedema of leg   . Microalbuminuria   . Sleep apnea    no CPAP  . Vitamin D deficiency     Past Surgical History:  Procedure Laterality Date  . A/V FISTULAGRAM Right 10/24/2018   Procedure: A/V FISTULAGRAM;  Surgeon: Katha Cabal, MD;  Location: [facility];  Service: Cardiovascular;  Laterality: Right;  . A/V FISTULAGRAM Right 11/24/2018   Procedure: A/V FISTULAGRAM;  Surgeon: Katha Cabal, MD;  Location: [facility];  Service: Cardiovascular;   Laterality: Right;  . A/V FISTULAGRAM Left 07/31/2019   Procedure: A/V FISTULAGRAM;  Surgeon: Katha Cabal, MD;  Location: [facility];  Service: Cardiovascular;  Laterality: Left;  . A/V SHUNT INTERVENTION Right 02/21/2019   Procedure: A/V SHUNT INTERVENTION;  Surgeon: Katha Cabal, MD;  Location: [facility];  Service: Cardiovascular;  Laterality: Right;  . APPLICATION OF WOUND VAC Left 10/03/2017   Procedure: APPLICATION OF WOUND VAC;  Surgeon: Algernon Huxley, MD;  Location: [facility];  Service: General;  Laterality: Left;  . APPLICATION OF WOUND VAC Left 10/11/2017   Procedure: APPLICATION OF WOUND VAC;  Surgeon: Katha Cabal, MD;  Location: [facility];  Service: Vascular;  Laterality: Left;  . APPLICATION OF WOUND VAC Left 10/14/2017   Procedure: WOUND VAC CHANGE;  Surgeon: Katha Cabal, MD;  Location: [facility];  Service: Vascular;  Laterality: Left;  left lower leg  . APPLICATION OF WOUND VAC Left 10/18/2017   Procedure: WOUND VAC CHANGE;  Surgeon: Katha Cabal, MD;  Location: [facility];  Service: Vascular;  Laterality: Left;  . APPLICATION OF WOUND VAC Left 10/07/2017   Procedure: APPLICATION OF WOUND VAC;  Surgeon: Katha Cabal, MD;  Location: [facility];  Service: Vascular;  Laterality: Left;  . AV FISTULA INSERTION W/ RF MAGNETIC GUIDANCE Right 07/19/2018   Procedure: AV FISTULA INSERTION W/RF MAGNETIC GUIDANCE;  Surgeon: Katha Cabal, MD;  Location: [facility];  Service: Cardiovascular;  Laterality: Right;  . AV FISTULA PLACEMENT Left 05/11/2019   Procedure: ARTERIOVENOUS (AV) FISTULA CREATION (RADIOCEPHALIC );  Surgeon: Katha Cabal, MD;  Location: [facility];  Service: Vascular;  Laterality: Left;  . CATARACT EXTRACTION W/PHACO Left 11/01/2018   Procedure: CATARACT EXTRACTION PHACO AND INTRAOCULAR LENS PLACEMENT (IOC)-LEFT, DIABETIC-INSULIN DEPENDENT;  Surgeon: Eulogio Bear, MD;  Location: [facility];  Service: Ophthalmology;   Laterality: Left;  [place] 00:41.8 CDE 4.61 Fluid Pack Lot # [id number]  . CATARACT EXTRACTION W/PHACO Right 04/27/2019   Procedure: CATARACT EXTRACTION PHACO AND INTRAOCULAR LENS PLACEMENT (IOC);  Surgeon: Eulogio Bear, MD;  Location: [facility];  Service: Ophthalmology;  Laterality: Right;  [place] 00:34 CDE 1.97 FLUID PACK LOT # [id number]   . DIALYSIS/PERMA CATHETER INSERTION N/A 02/23/2019   Procedure: DIALYSIS/PERMA CATHETER INSERTION;  Surgeon: Algernon Huxley, MD;  Location: [facility];  Service: Cardiovascular;  Laterality: N/A;  . I&D EXTREMITY Left 10/11/2017   Procedure: IRRIGATION AND DEBRIDEMENT EXTREMITY;  Surgeon: Katha Cabal, MD;  Location: [facility];  Service: Vascular;  Laterality: Left;  . INCISION AND DRAINAGE ABSCESS Right 10/07/2017   Procedure: INCISION AND DRAINAGE ABSCESS;  Surgeon: Katha Cabal, MD;  Location: [facility];  Service: Vascular;  Laterality: Right;  . IRRIGATION AND DEBRIDEMENT ABSCESS Left 10/03/2017   Procedure: IRRIGATION AND DEBRIDEMENT ABSCESS with debridement of skin, soft tissue, muscle 50sq cm;  Surgeon: Algernon Huxley, MD;  Location: [facility];  Service: General;  Laterality: Left;  . TEMPORARY DIALYSIS CATHETER  02/20/2019   Procedure: TEMPORARY DIALYSIS CATHETER;  Surgeon: Katha Cabal, MD;  Location: [place] [facility];  Service: Cardiovascular;;    Social History Social History   Tobacco Use  . Smoking status: Former Smoker    Types: Cigars  . Smokeless tobacco: Former Systems developer    Quit date: 09/30/2017  . Tobacco comment: occasional  Substance Use Topics  . Alcohol use: Yes    Alcohol/week: 1.0 standard drinks    Types: 1 Cans of beer per week    Comment: beer  . Drug use: Not Currently    Family History Family History  Problem Relation Age of Onset  . Diabetes Father   . Kidney disease Father   . Diabetes Daughter     Allergies  Allergen Reactions  . Lisinopril Swelling  . Adhesive [Tape] Itching  . Furosemide Cough      Light headed, blurry vision, weakness.     REVIEW OF SYSTEMS (Negative unless checked)  Constitutional: [] Weight loss  [] Fever  [] Chills Cardiac: [] Chest pain   [] Chest pressure   [] Palpitations   [] Shortness of breath when laying flat   [] Shortness of breath with exertion. Vascular:  [] Pain in legs with walking   [x] Pain in legs at rest  [] History of DVT   [] Phlebitis   [x] Swelling in legs   [] Varicose veins   [] Non-healing ulcers Pulmonary:   [] Uses home oxygen   [] Productive cough   [] Hemoptysis   [] Wheeze  [] COPD   [] Asthma Neurologic:  [] Dizziness   [] Seizures   [] History of stroke   [] History of TIA  [] Aphasia   [] Vissual changes   [] Weakness or numbness in arm   [] Weakness or numbness in leg Musculoskeletal:   [] Joint swelling   [] Joint pain   [] Low back pain Hematologic:  [] Easy bruising  [] Easy bleeding   [] Hypercoagulable state   [] Anemic Gastrointestinal:  [] Diarrhea   [] Vomiting  [] Gastroesophageal reflux/heartburn   []   Difficulty swallowing. Genitourinary:  [x] Chronic kidney disease   [] Difficult urination  [] Frequent urination   [] Blood in urine Skin:  [] Rashes   [] Ulcers  Psychological:  [] History of anxiety   []  History of major depression.  Physical Examination  There were no vitals filed for this visit. There is no height or weight on file to calculate BMI. Gen: WD/WN, NAD Head: [place]/AT, No temporalis wasting.  Ear/Nose/Throat: Hearing grossly intact, nares w/o erythema or drainage Eyes: PER, EOMI, sclera nonicteric.  Neck: Supple, no large masses.   Pulmonary:  Good air movement, no audible wheezing bilaterally, no use of accessory muscles.  Cardiac: RRR, no JVD Vascular: Left wrist fistula good thrill good bruit prominence of many superficial veins especially on the volar surface of the forearm. scattered varicosities present bilaterally.  Profound venous stasis changes to the legs bilaterally.  4+ hard pitting edema healed scars from his significant venous  ulcers Vessel Right Left  Radial Palpable Palpable  Ulnar Palpable Palpable  Brachial Palpable Palpable  Gastrointestinal: Non-distended. No guarding/no peritoneal signs.  Musculoskeletal: M/S 5/5 throughout.  No deformity or atrophy.  Neurologic: CN 2-12 intact. Symmetrical.  Speech is fluent. Motor exam as listed above. Psychiatric: Judgment intact, Mood & affect appropriate for pt's clinical situation. Dermatologic: No rashes or ulcers noted.  No changes consistent with cellulitis. Lymph : No lichenification or skin changes of chronic lymphedema.  CBC Lab Results  Component Value Date   WBC 5.7 05/08/2019   HGB 14.3 05/11/2019   HCT 42.0 05/11/2019   MCV 96.6 05/08/2019   PLT 199 05/08/2019    BMET    Component Value Date/Time   NA 136 05/11/2019 0617   K 4.0 05/11/2019 0617   CL 98 05/08/2019 1333   CO2 23 05/08/2019 1333   GLUCOSE 216 (H) 05/11/2019 0617   BUN 56 (H) 05/08/2019 1333   CREATININE 12.50 (H) 05/08/2019 1333   CALCIUM 8.6 (L) 05/08/2019 1333   GFRNONAA 4 (L) 05/08/2019 1333   GFRAA 5 (L) 05/08/2019 1333   CrCl cannot be calculated (Patient's most recent lab result is older than the maximum 21 days allowed.).  COAG Lab Results  Component Value Date   INR 1.0 05/08/2019   INR 1.0 02/22/2019   INR 1.09 07/14/2018    Radiology PERIPHERAL VASCULAR CATHETERIZATION  Result Date: 07/31/2019 See op note     Assessment/Plan 1. Complication of vascular access for dialysis, sequela Recommend:  The patient is experiencing problems with their dialysis access with failure to mature of his left wrist fistula.  Patient had a recent fistulagram which showed strictures within the radial artery that are consistent with clamp injuries.  They are clearly flow-limiting and this is 1 significant reason his fistula has not matured.  The anastomosis itself is widely patent.  Second, there are at least 3 large tributaries of the cephalic vein located near the  anastomosis that clearly are reducing the overall volume of flow within the fistula again a significant reason for the cephalic vein itself to have difficulty maturing.  I have recommended arteriography with the intention for intervention is to restore appropriate flow and prevent thrombosis and possible loss of the access.  As I described with Mr. Manalang I will attempt to access the radial artery distally this would allow for treatment of his strictures at the anastomotic area and then it would allow for access into the fistula so that the large tributaries could be coiled.  The hope is successful intervention will allow for  maturation of the fistula as well as improve the quality of dialysis therapy.  The risks, benefits and alternative therapies were reviewed in detail with the patient.  All questions were answered.  The patient agrees to proceed with angio/intervention.     A total of 30 minutes was spent with this patient and greater than 50% was spent in counseling and coordination of care with the patient.  Discussion included the treatment options for vascular disease including indications for surgery and intervention.  Also discussed is the appropriate timing of treatment.  In addition medical therapy was discussed.  2. PVD (peripheral vascular disease) ([place])  Recommend:  The patient has evidence of atherosclerosis of the lower extremities with claudication.  The patient does not voice lifestyle limiting changes at this point in time.  Noninvasive studies do not suggest clinically significant change.  No invasive studies, angiography or surgery at this time The patient should continue walking and begin a more formal exercise program.  The patient should continue antiplatelet therapy and aggressive treatment of the lipid abnormalities  No changes in the patient's medications at this time  The patient should continue wearing graduated compression wraps 20-30 mmHg strength to control the  edema.    3. Chronic venous insufficiency  No surgery or intervention at this point in time.    I have reviewed my discussion with the patient regarding lymphedema and why it  causes symptoms.  Patient will continue wearing graduated compression stockings class 1 (20-30 mmHg) on a daily basis a prescription was given. The patient is reminded to put the stockings on first thing in the morning and removing them in the evening. The patient is instructed specifically not to sleep in the stockings.   In addition, behavioral modification throughout the day will be continued.  This will include frequent elevation (such as in a recliner), use of over the counter pain medications as needed and exercise such as walking.  I have reviewed systemic causes for chronic edema such as liver, kidney and cardiac etiologies and there does not appear to be any significant changes in these organ systems over the past year.  The patient is under the impression that these organ systems are all stable and unchanged.    The patient will continue aggressive use of the  lymph pump.  This will continue to improve the edema control and prevent sequela such as ulcers and infections.    4. Lymphedema  No surgery or intervention at this point in time.    I have reviewed my discussion with the patient regarding lymphedema and why it  causes symptoms.  Patient will continue wearing graduated compression stockings class 1 (20-30 mmHg) on a daily basis a prescription was given. The patient is reminded to put the stockings on first thing in the morning and removing them in the evening. The patient is instructed specifically not to sleep in the stockings.   In addition, behavioral modification throughout the day will be continued.  This will include frequent elevation (such as in a recliner), use of over the counter pain medications as needed and exercise such as walking.  I have reviewed systemic causes for chronic edema such as  liver, kidney and cardiac etiologies and there does not appear to be any significant changes in these organ systems over the past year.  The patient is under the impression that these organ systems are all stable and unchanged.    The patient will continue aggressive use of the  lymph pump.  This will continue to improve the edema control and prevent sequela such as ulcers and infections.   5. Essential hypertension Continue antihypertensive medications as already ordered, these medications have been reviewed and there are no changes at this time.   6. Type 2 diabetes mellitus with other circulatory complication, with long-term current use of insulin (HCC) Continue hypoglycemic medications as already ordered, these medications have been reviewed and there are no changes at this time.  Hgb A1C to be monitored as already arranged by primary service    [name], MD  08/16/2019 8:37 AM

## 2019-08-17 ENCOUNTER — Encounter (INDEPENDENT_AMBULATORY_CARE_PROVIDER_SITE_OTHER): Payer: Self-pay | Admitting: Vascular Surgery

## 2019-08-31 IMAGING — US US EXTREM LOW VENOUS*L*
1 series · 14 of 24 positions shown · non-contrast
Comparison: 04/06/2004 by report only

CLINICAL DATA: Left lower extremity swelling x4 weeks

EXAM:
LEFT LOWER EXTREMITY VENOUS DOPPLER ULTRASOUND
TECHNIQUE: Gray-scale sonography with compression, as well as color and duplex
ultrasound, were performed to evaluate the deep venous system from
the level of the common femoral vein through the popliteal and
proximal calf veins.

[Series 1: us extrem low venous*left* · 0.11mm/px · 14 of 38 slices shown]
[im 1/38]
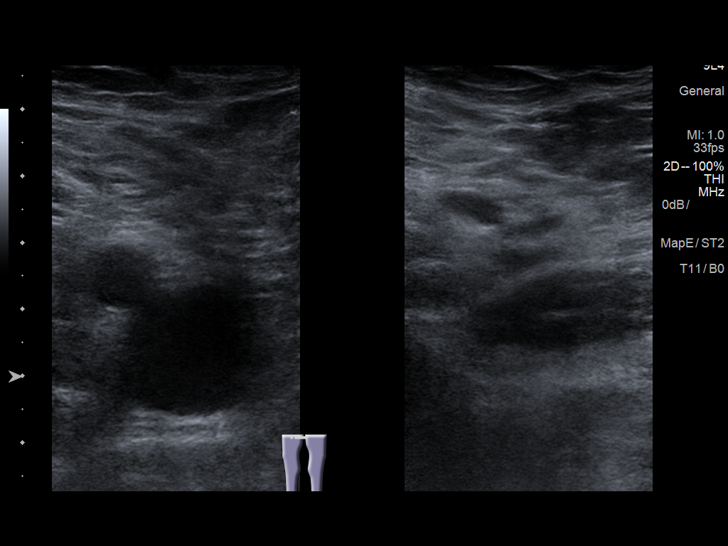
[im 4/38]
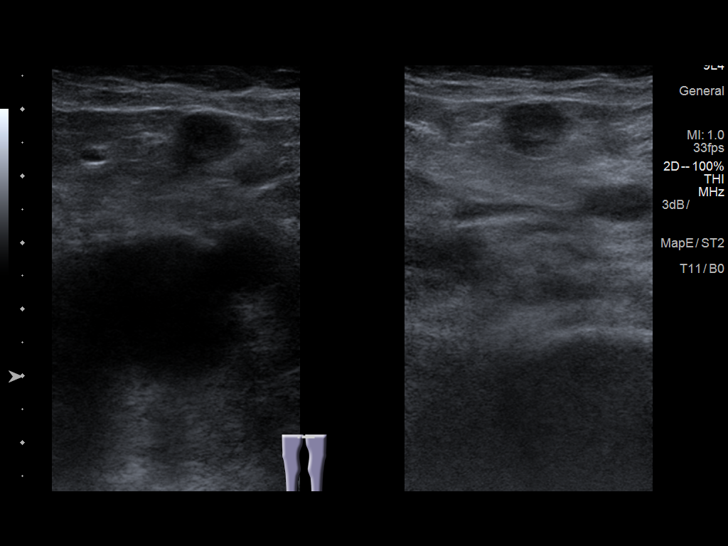
[im 7/38]
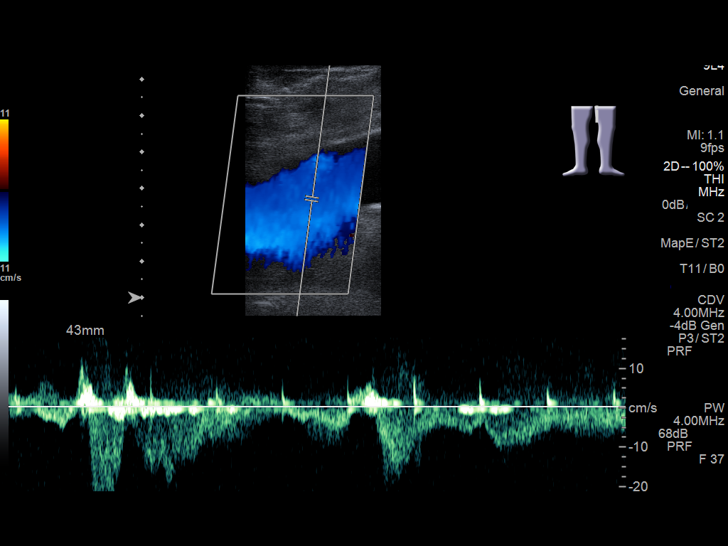
[im 10/38]
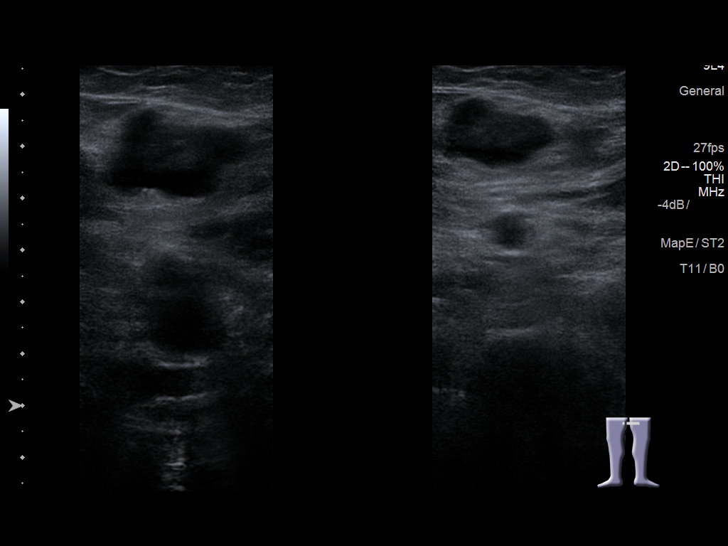
[im 12/38]
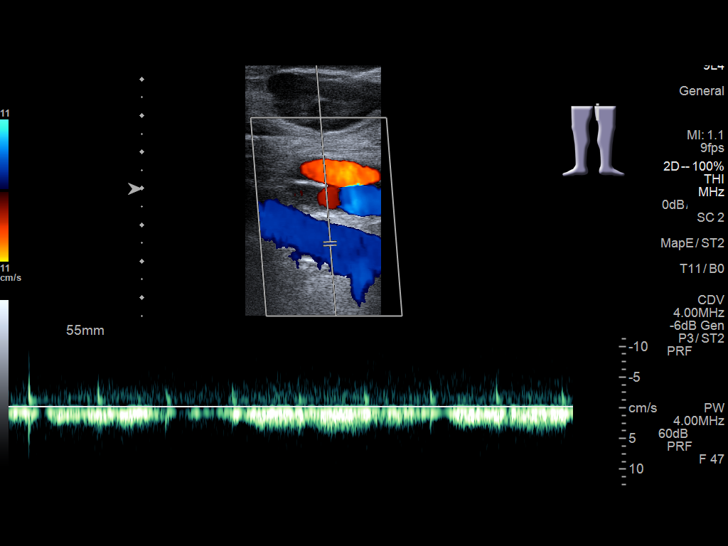
[im 15/38]
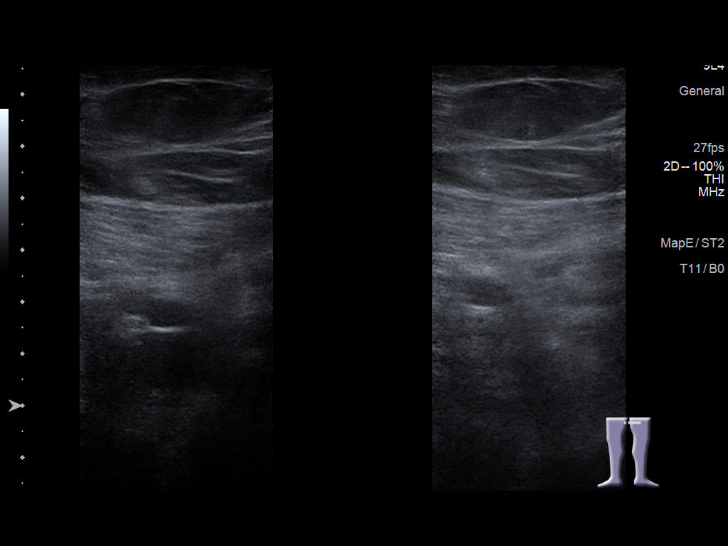
[im 18/38]
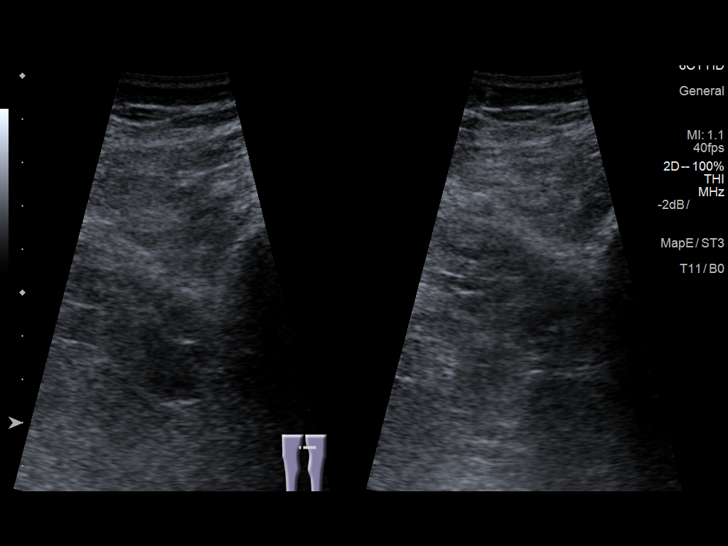
[im 20/38]
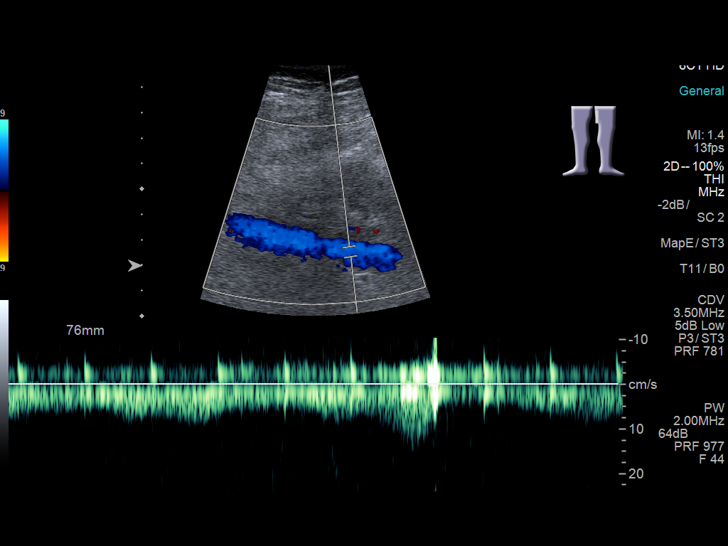
[im 23/38]
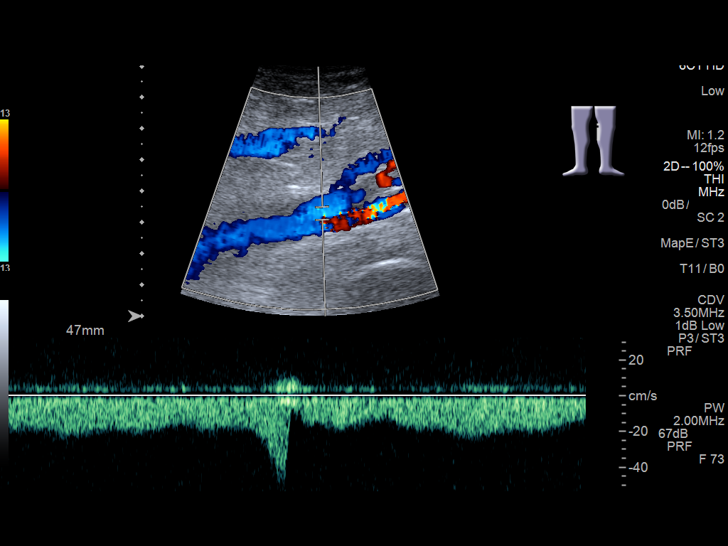
[im 26/38]
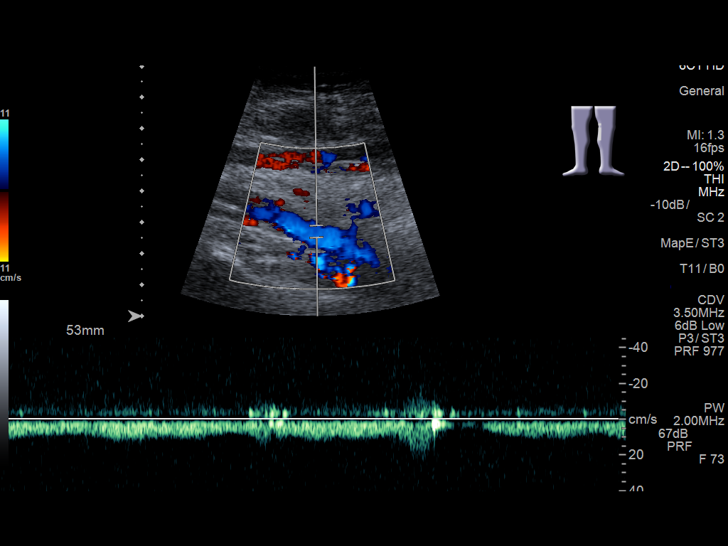
[im 29/38]
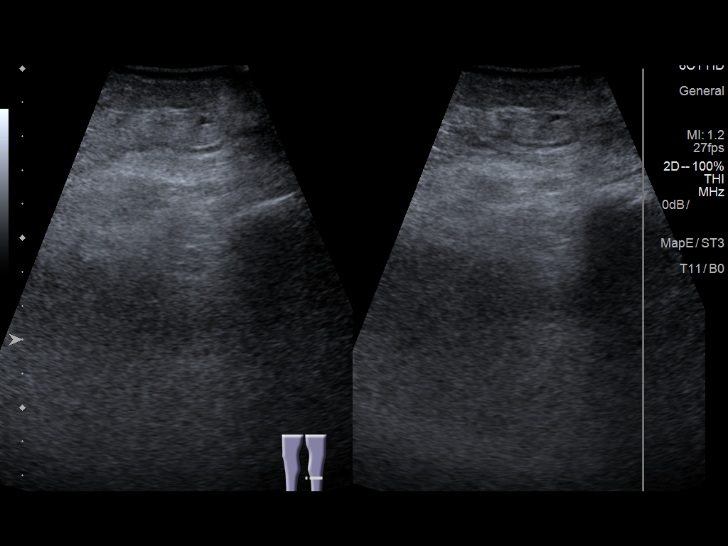
[im 31/38]
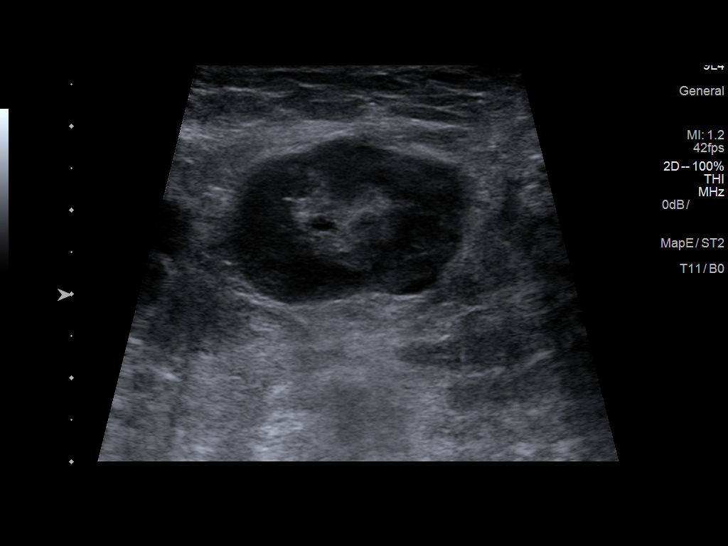
[im 34/38]
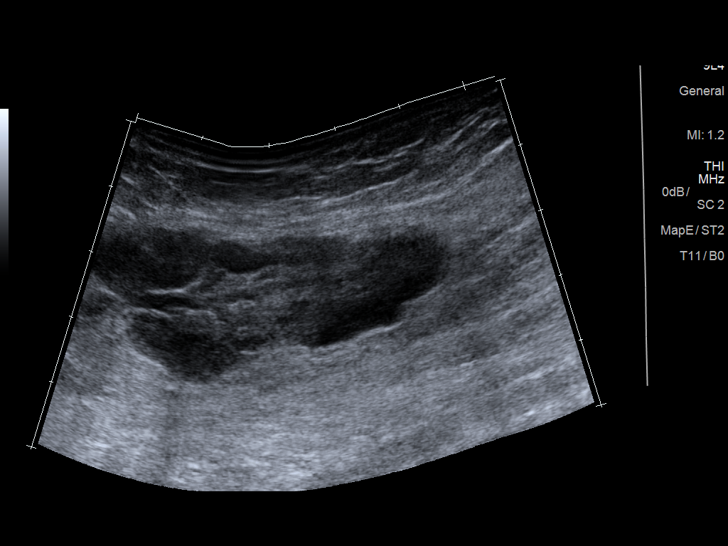
[im 38/38]
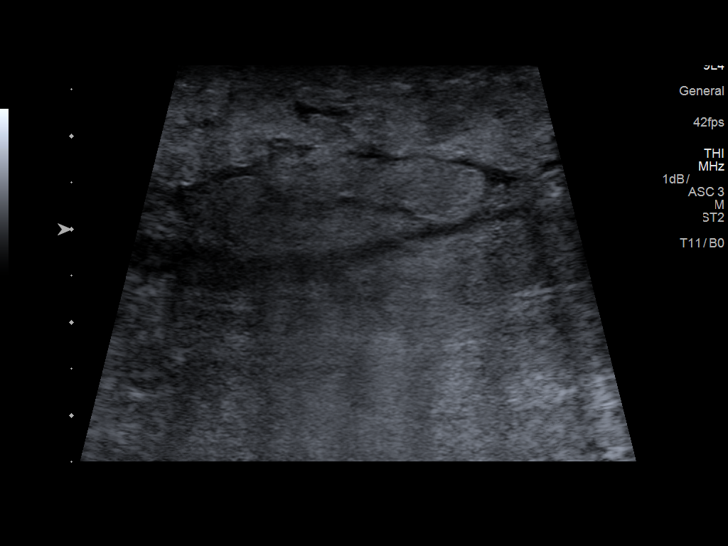

[14 of 24 positions shown; findings below may reference images not displayed]

FINDINGS: Normal compressibility of the common femoral, superficial femoral,
and popliteal veins, as well as the proximal calf veins. No filling
defects to suggest DVT on grayscale or color Doppler imaging.
Doppler waveforms show normal direction of venous flow, normal
respiratory phasicity and response to augmentation. Prominent left
inguinal lymph node measuring 2 cm short axis diameter, possibly
reactive but nonspecific. Subcutaneous edema in the calf. Survey
views of the contralateral common femoral vein are unremarkable.
IMPRESSION: No evidence of LEFT lower extremity deep vein thrombosis.

## 2019-08-31 IMAGING — US US RENAL
1 series · 14 of 25 positions shown · non-contrast
Comparison: None.

CLINICAL DATA: Acute kidney failure, chronic kidney disease stage
3.

EXAM:
RENAL / URINARY TRACT ULTRASOUND COMPLETE

[Series 1: us renal · 0.30mm/px · 14 of 26 slices shown]
[im 1/26]
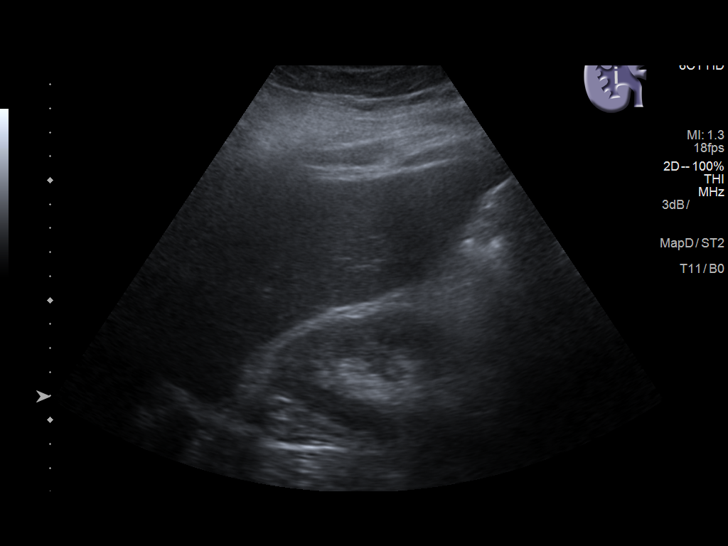
[im 3/26]
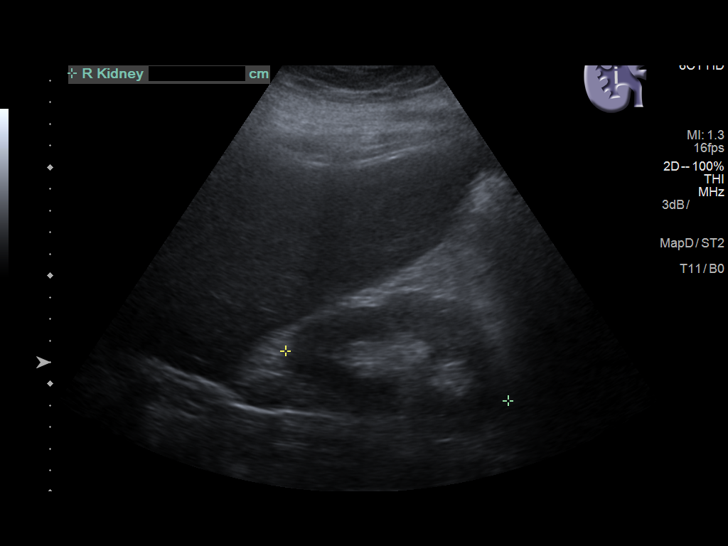
[im 5/26]
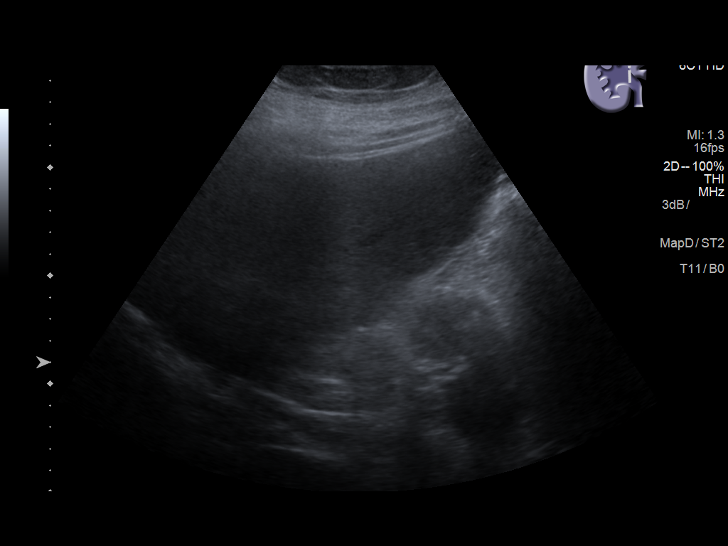
[im 7/26]
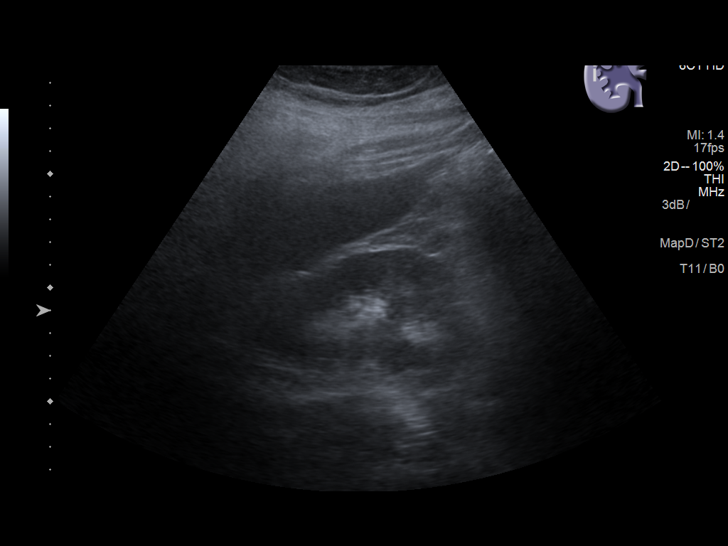
[im 9/26]
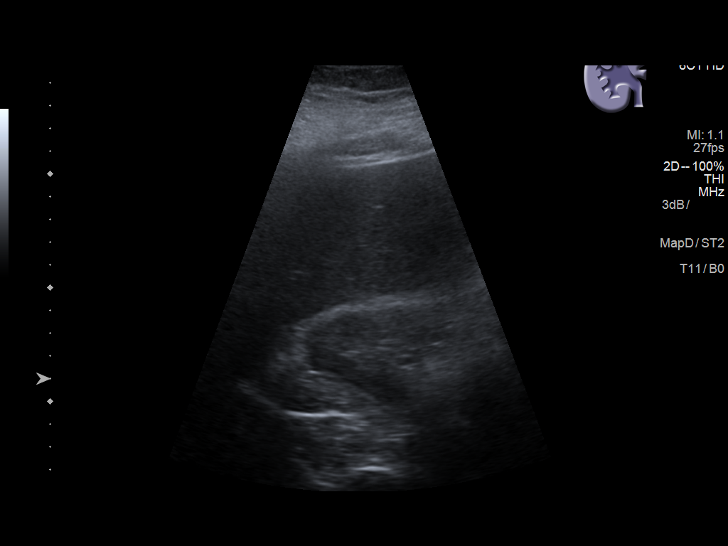
[im 10/26]
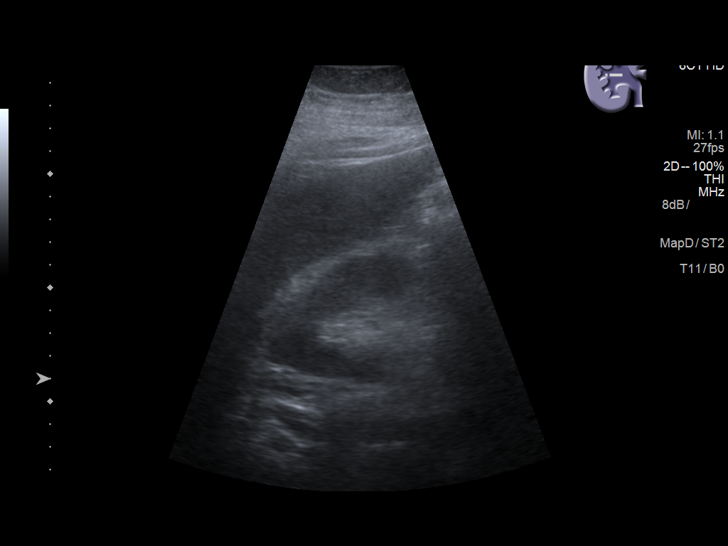
[im 12/26]
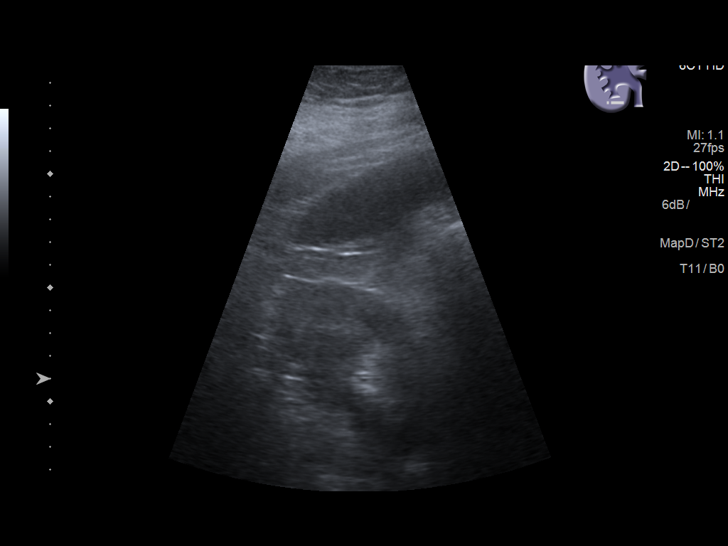
[im 14/26]
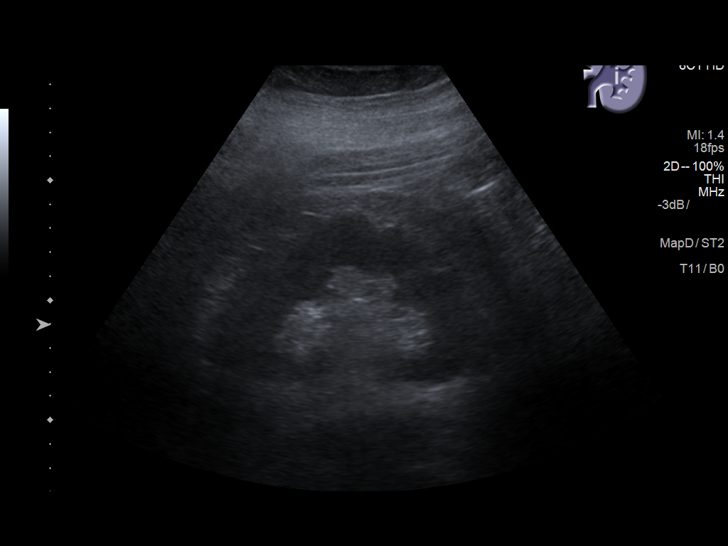
[im 16/26]
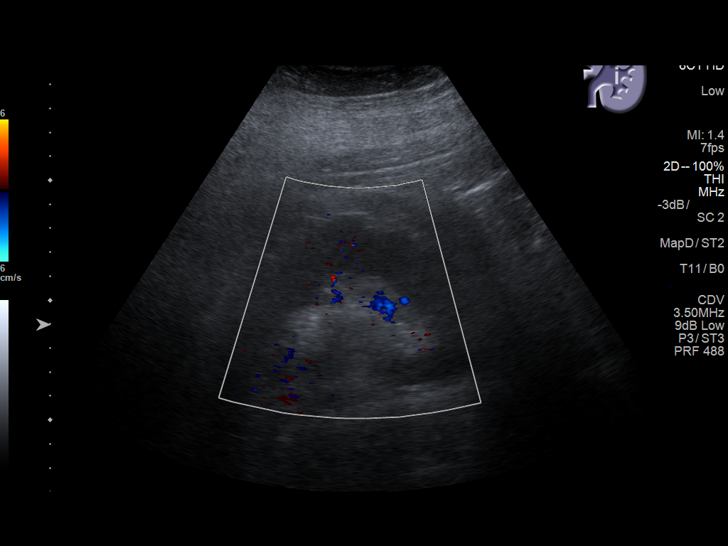
[im 17/26]
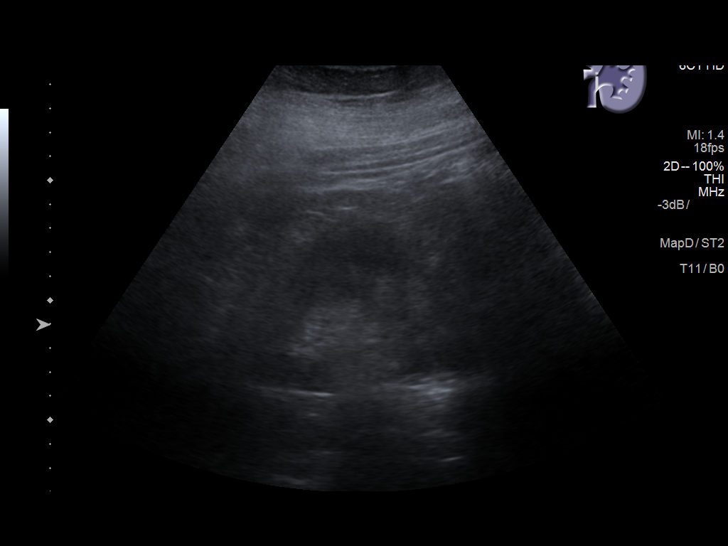
[im 19/26]
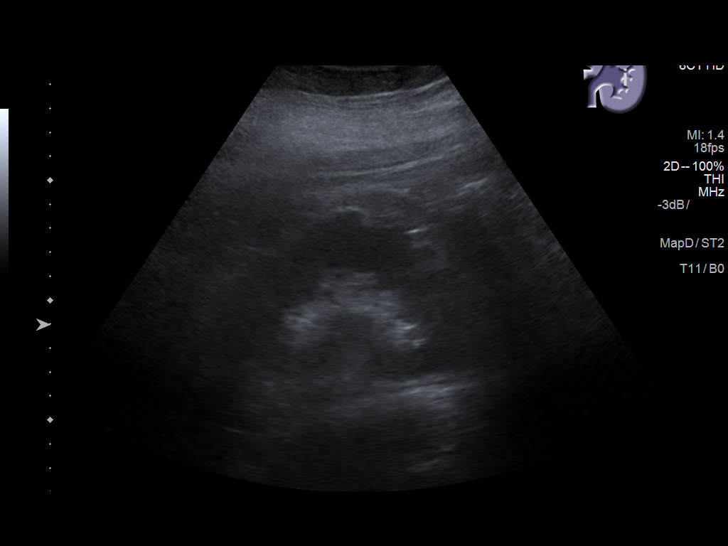
[im 21/26]
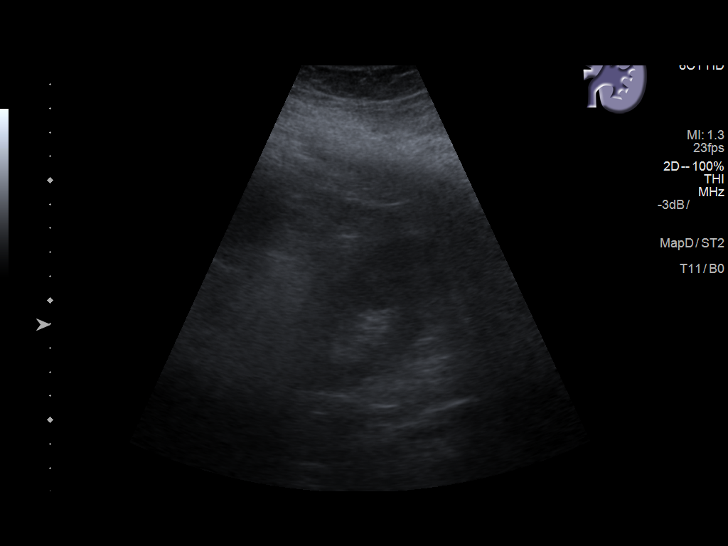
[im 23/26]
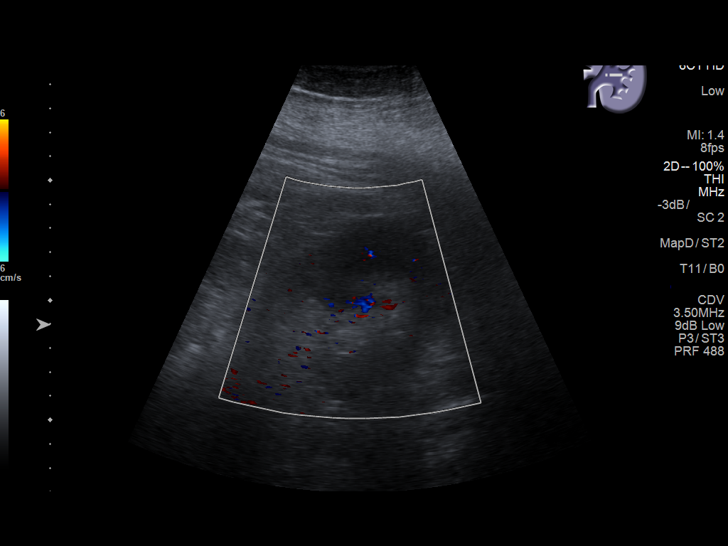
[im 26/26]
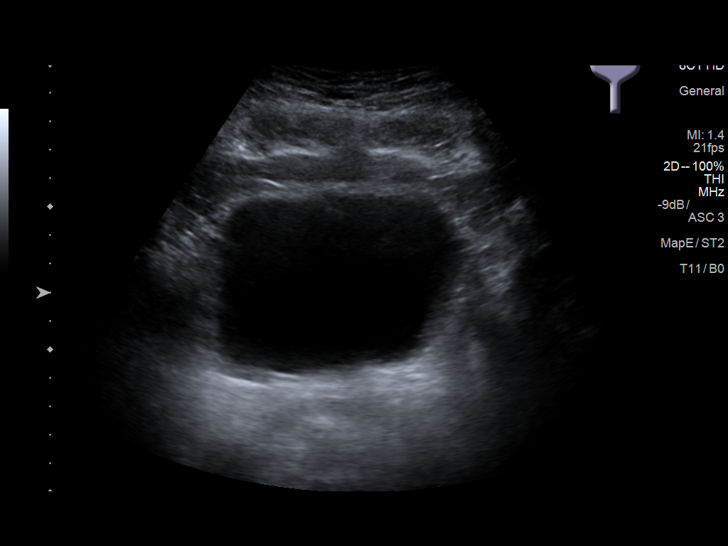

[14 of 25 positions shown; findings below may reference images not displayed]

FINDINGS: Right Kidney:

Length: 10.5 cm. Echogenicity within normal limits. No mass or
hydronephrosis visualized.

Left Kidney:

Length: 11.7 cm. Echogenicity within normal limits. No mass or
hydronephrosis visualized.

Bladder:

Appears normal for degree of bladder distention.
IMPRESSION: Normal renal ultrasound.

## 2019-09-05 ENCOUNTER — Telehealth (INDEPENDENT_AMBULATORY_CARE_PROVIDER_SITE_OTHER): Payer: Self-pay

## 2019-09-05 NOTE — Telephone Encounter (Signed)
Spoke with the patient and he is now scheduled for angio on 09/18/19 with Dr. Delana Meyer with a 11:00 am arrival time to the [facility]. Patient will do covid testing on 09/14/19 before his dialysis at 11:00 am. Pre-procedure instructions were discussed and will be mailed to the patient.

## 2019-09-14 ENCOUNTER — Other Ambulatory Visit
Admission: RE | Admit: 2019-09-14 | Discharge: 2019-09-14 | Disposition: A | Discharge status: Home / Self Care | Payer: Medicare Other | Source: Ambulatory Visit | Attending: Vascular Surgery | Admitting: Vascular Surgery

## 2019-09-14 ENCOUNTER — Other Ambulatory Visit: Payer: Self-pay

## 2019-09-14 DIAGNOSIS — Z01812 Encounter for preprocedural laboratory examination: Secondary | ICD-10-CM | POA: Insufficient documentation

## 2019-09-14 DIAGNOSIS — Z20822 Contact with and (suspected) exposure to covid-19: Secondary | ICD-10-CM | POA: Insufficient documentation

## 2019-09-15 LAB — SARS CORONAVIRUS 2 (TAT 6-24 HRS): SARS Coronavirus 2: NEGATIVE

## 2019-09-18 ENCOUNTER — Other Ambulatory Visit: Payer: Self-pay

## 2019-09-18 ENCOUNTER — Encounter
Admission: RE | Disposition: A | Discharge status: Home / Self Care | Payer: Self-pay | Source: Home / Self Care | Attending: Vascular Surgery

## 2019-09-18 ENCOUNTER — Other Ambulatory Visit (INDEPENDENT_AMBULATORY_CARE_PROVIDER_SITE_OTHER): Payer: Self-pay | Admitting: Nurse Practitioner

## 2019-09-18 ENCOUNTER — Ambulatory Visit: Payer: Medicare Other | Admitting: Anesthesiology

## 2019-09-18 ENCOUNTER — Encounter: Payer: Self-pay | Admitting: Vascular Surgery

## 2019-09-18 ENCOUNTER — Ambulatory Visit
Admission: RE | Admit: 2019-09-18 | Discharge: 2019-09-18 | Disposition: A | Discharge status: Home / Self Care | Payer: Medicare Other | Attending: Vascular Surgery | Admitting: Vascular Surgery

## 2019-09-18 DIAGNOSIS — T82858A Stenosis of vascular prosthetic devices, implants and grafts, initial encounter: Secondary | ICD-10-CM | POA: Diagnosis present

## 2019-09-18 DIAGNOSIS — I120 Hypertensive chronic kidney disease with stage 5 chronic kidney disease or end stage renal disease: Secondary | ICD-10-CM | POA: Insufficient documentation

## 2019-09-18 DIAGNOSIS — Z87891 Personal history of nicotine dependence: Secondary | ICD-10-CM | POA: Insufficient documentation

## 2019-09-18 DIAGNOSIS — N186 End stage renal disease: Secondary | ICD-10-CM | POA: Insufficient documentation

## 2019-09-18 DIAGNOSIS — T82868A Thrombosis of vascular prosthetic devices, implants and grafts, initial encounter: Secondary | ICD-10-CM

## 2019-09-18 DIAGNOSIS — Z992 Dependence on renal dialysis: Secondary | ICD-10-CM

## 2019-09-18 DIAGNOSIS — G4730 Sleep apnea, unspecified: Secondary | ICD-10-CM | POA: Insufficient documentation

## 2019-09-18 DIAGNOSIS — Y841 Kidney dialysis as the cause of abnormal reaction of the patient, or of later complication, without mention of misadventure at the time of the procedure: Secondary | ICD-10-CM | POA: Diagnosis not present

## 2019-09-18 DIAGNOSIS — I890 Lymphedema, not elsewhere classified: Secondary | ICD-10-CM | POA: Insufficient documentation

## 2019-09-18 DIAGNOSIS — E559 Vitamin D deficiency, unspecified: Secondary | ICD-10-CM | POA: Insufficient documentation

## 2019-09-18 DIAGNOSIS — F419 Anxiety disorder, unspecified: Secondary | ICD-10-CM | POA: Diagnosis not present

## 2019-09-18 DIAGNOSIS — F329 Major depressive disorder, single episode, unspecified: Secondary | ICD-10-CM | POA: Diagnosis not present

## 2019-09-18 DIAGNOSIS — E1122 Type 2 diabetes mellitus with diabetic chronic kidney disease: Secondary | ICD-10-CM | POA: Diagnosis not present

## 2019-09-18 HISTORY — PX: UPPER EXTREMITY ANGIOGRAPHY: CATH118270

## 2019-09-18 LAB — POTASSIUM (ARMC VASCULAR LAB ONLY): Potassium (ARMC vascular lab): 5.2 — ABNORMAL HIGH (ref 3.5–5.1)

## 2019-09-18 LAB — GLUCOSE, CAPILLARY: Glucose-Capillary: 134 mg/dL — ABNORMAL HIGH (ref 70–99)

## 2019-09-18 SURGERY — UPPER EXTREMITY ANGIOGRAPHY
Anesthesia: General | Laterality: Left

## 2019-09-18 MED ORDER — CEFAZOLIN SODIUM-DEXTROSE 1-4 GM/50ML-% IV SOLN
1.0000 g | Freq: Once | INTRAVENOUS | Status: AC
  Administered 2019-09-18: 1 g via INTRAVENOUS

## 2019-09-18 MED ORDER — FENTANYL CITRATE (PF) 100 MCG/2ML IJ SOLN
INTRAMUSCULAR | Status: DC | PRN
  Administered 2019-09-18: 25 ug via INTRAVENOUS
  Administered 2019-09-18: 50 ug via INTRAVENOUS
  Administered 2019-09-18: 25 ug via INTRAVENOUS

## 2019-09-18 MED ORDER — PROPOFOL 10 MG/ML IV BOLUS
INTRAVENOUS | Status: AC
  Filled 2019-09-18: qty 20

## 2019-09-18 MED ORDER — HEPARIN SODIUM (PORCINE) 1000 UNIT/ML IJ SOLN
INTRAMUSCULAR | Status: DC | PRN
  Administered 2019-09-18: 3000 [IU] via INTRAVENOUS

## 2019-09-18 MED ORDER — ACETAMINOPHEN 10 MG/ML IV SOLN: 1000.0000 mg | Freq: Once | INTRAVENOUS | Status: DC | PRN

## 2019-09-18 MED ORDER — FENTANYL CITRATE (PF) 100 MCG/2ML IJ SOLN
INTRAMUSCULAR | Status: AC
  Filled 2019-09-18: qty 2

## 2019-09-18 MED ORDER — METHYLPREDNISOLONE SODIUM SUCC 125 MG IJ SOLR: 125.0000 mg | Freq: Once | INTRAMUSCULAR | Status: DC | PRN

## 2019-09-18 MED ORDER — PROPOFOL 500 MG/50ML IV EMUL
INTRAVENOUS | Status: DC | PRN
  Administered 2019-09-18: 50 ug/kg/min via INTRAVENOUS

## 2019-09-18 MED ORDER — ONDANSETRON HCL 4 MG/2ML IJ SOLN: 4.0000 mg | Freq: Once | INTRAMUSCULAR | Status: DC | PRN

## 2019-09-18 MED ORDER — OXYCODONE HCL 5 MG/5ML PO SOLN
5.0000 mg | Freq: Once | ORAL | Status: DC | PRN
  Filled 2019-09-18: qty 5

## 2019-09-18 MED ORDER — HYDROMORPHONE HCL 1 MG/ML IJ SOLN: 1.0000 mg | Freq: Once | INTRAMUSCULAR | Status: DC | PRN

## 2019-09-18 MED ORDER — FENTANYL CITRATE (PF) 100 MCG/2ML IJ SOLN: 25.0000 ug | INTRAMUSCULAR | Status: DC | PRN

## 2019-09-18 MED ORDER — SODIUM CHLORIDE 0.9 % IV SOLN: INTRAVENOUS | Status: DC

## 2019-09-18 MED ORDER — DIPHENHYDRAMINE HCL 50 MG/ML IJ SOLN: 50.0000 mg | Freq: Once | INTRAMUSCULAR | Status: DC | PRN

## 2019-09-18 MED ORDER — ONDANSETRON HCL 4 MG/2ML IJ SOLN: 4.0000 mg | Freq: Four times a day (QID) | INTRAMUSCULAR | Status: DC | PRN

## 2019-09-18 MED ORDER — MIDAZOLAM HCL 2 MG/2ML IJ SOLN
INTRAMUSCULAR | Status: DC | PRN
  Administered 2019-09-18: 2 mg via INTRAVENOUS

## 2019-09-18 MED ORDER — FAMOTIDINE 20 MG PO TABS: 40.0000 mg | ORAL_TABLET | Freq: Once | ORAL | Status: DC | PRN

## 2019-09-18 MED ORDER — IODIXANOL 320 MG/ML IV SOLN
INTRAVENOUS | Status: DC | PRN
  Administered 2019-09-18: 15:00:00 40 mL

## 2019-09-18 MED ORDER — OXYCODONE HCL 5 MG PO TABS: 5.0000 mg | ORAL_TABLET | Freq: Once | ORAL | Status: DC | PRN

## 2019-09-18 MED ORDER — MIDAZOLAM HCL 2 MG/ML PO SYRP: 8.0000 mg | ORAL_SOLUTION | Freq: Once | ORAL | Status: DC | PRN

## 2019-09-18 MED ORDER — NITROGLYCERIN 1 MG/10 ML FOR IR/CATH LAB
INTRA_ARTERIAL | Status: DC | PRN
  Administered 2019-09-18: 200 ug via INTRA_ARTERIAL
  Administered 2019-09-18 (×2): 100 ug via INTRA_ARTERIAL

## 2019-09-18 MED ORDER — MIDAZOLAM HCL 2 MG/2ML IJ SOLN
INTRAMUSCULAR | Status: AC
  Filled 2019-09-18: qty 2

## 2019-09-18 MED ORDER — LIDOCAINE HCL (PF) 2 % IJ SOLN
INTRAMUSCULAR | Status: AC
  Filled 2019-09-18: qty 10

## 2019-09-18 SURGICAL SUPPLY — 26 items
BALLN LUTONIX 018 4X40X130 (BALLOONS) ×3
BALLN LUTONIX 018 6X40X130 (BALLOONS) ×3
BALLN ULTRVRSE 3X40X150 (BALLOONS) ×2
BALLN ULTRVRSE 3X40X150 OTW (BALLOONS) ×1
BALLOON LUTONIX 018 4X40X130 (BALLOONS) IMPLANT
BALLOON LUTONIX 018 6X40X130 (BALLOONS) IMPLANT
BALLOON ULTRVRSE 3X40X150 OTW (BALLOONS) IMPLANT
CATH BEACON 5 .035 40 KMP TP (CATHETERS) IMPLANT
CATH BEACON 5 .038 40 KMP TP (CATHETERS) ×2
CATH MICROCATH PRGRT 2.8F 110 (CATHETERS) IMPLANT
COIL 400 COMPLEX SOFT 6X20CM (Vascular Products) ×2 IMPLANT
COIL 400 COMPLEX SOFT 6X30CM (Vascular Products) ×4 IMPLANT
COIL 400 COMPLEX STD 6X20CM (Vascular Products) ×2 IMPLANT
COIL 400 COMPLEX STD 6X30CM (Vascular Products) ×4 IMPLANT
DEVICE PRESTO INFLATION (MISCELLANEOUS) ×2 IMPLANT
DEVICE RAD COMP TR BAND LRG (VASCULAR PRODUCTS) ×2 IMPLANT
GUIDEWIRE ADV .018X180CM (WIRE) ×2 IMPLANT
HANDLE DETACHMENT COIL (MISCELLANEOUS) ×2 IMPLANT
MICROCATH PROGREAT 2.8F 110 CM (CATHETERS) ×6
NDL ENTRY 21GA 7CM ECHOTIP (NEEDLE) IMPLANT
NEEDLE ENTRY 21GA 7CM ECHOTIP (NEEDLE) ×3 IMPLANT
PACK ANGIOGRAPHY (CUSTOM PROCEDURE TRAY) ×2 IMPLANT
SET INTRO CAPELLA COAXIAL (SET/KITS/TRAYS/PACK) ×2 IMPLANT
SHEATH BRITE TIP 6FRX5.5 (SHEATH) ×2 IMPLANT
SHEATH HALO 035 5FRX10 (SHEATH) ×2 IMPLANT
WIRE G 018X200 V18 (WIRE) ×2 IMPLANT

## 2019-09-18 NOTE — Transfer of Care (Signed)
Immediate Anesthesia Transfer of Care Note  Patient: [name]  Procedure(s) Performed: UPPER EXTREMITY ANGIOGRAPHY (Left )  Patient Location: PACU  Anesthesia Type:General  Level of Consciousness: awake, alert  and oriented  Airway & Oxygen Therapy: Patient Spontanous Breathing  Post-op Assessment: Report given to RN and Post -op Vital signs reviewed and stable  Post vital signs: Reviewed and stable  Last Vitals:  Vitals Value Taken Time  BP 129/55 09/18/19 1441  Temp 36.1 C 09/18/19 1441  Pulse 58 09/18/19 1444  Resp 20 09/18/19 1444  SpO2 100 % 09/18/19 1444  Vitals shown include unvalidated device data.  Last Pain:  Vitals:   09/18/19 1108  TempSrc: Oral         Complications: No apparent anesthesia complications

## 2019-09-18 NOTE — H&P (Signed)
[facility] History & Physical Update  The patient was interviewed and re-examined.  The patient's previous History and Physical has been reviewed and is unchanged.  There is no change in the plan of care. We plan to proceed with the scheduled procedure.  [name], MD  09/18/2019, 11:57 AM

## 2019-09-18 NOTE — Anesthesia Postprocedure Evaluation (Signed)
Anesthesia Post Note  Patient: [name]  Procedure(s) Performed: UPPER EXTREMITY ANGIOGRAPHY (Left )  Patient location during evaluation: PACU Anesthesia Type: General Level of consciousness: awake and alert Pain management: pain level controlled Vital Signs Assessment: post-procedure vital signs reviewed and stable Respiratory status: spontaneous breathing, nonlabored ventilation, respiratory function stable and patient connected to nasal cannula oxygen Cardiovascular status: blood pressure returned to baseline and stable Postop Assessment: no apparent nausea or vomiting Anesthetic complications: no     Last Vitals:  Vitals:   09/18/19 1441 09/18/19 1456  BP: (!) 129/55 (!) 155/89  Pulse: 62 61  Resp: (!) 21 18  Temp: (!) 36.1 C   SpO2: 100% 99%    Last Pain:  Vitals:   09/18/19 1441  TempSrc:   PainSc: 0-No pain                 Arita Miss

## 2019-09-18 NOTE — Progress Notes (Signed)
Patient remains clinically stable while here in recovery, TR band off with no visible bleeding nor hematoma.denies complaints at this time. Vitals have remained stable. Discharge instructions given to patient and mother with questions answered.

## 2019-09-18 NOTE — H&P (Signed)
[facility] Admission History & Physical  MRN : [id number]  [name] is a 49 y.o. ([birth year]) male who presents with chief complaint of my fistula is not maturing.  History of Present Illness: I am asked to evaluate the patient by the dialysis center. The patient was sent here because they were unable to achieve adequate dialysis from his left wrist fistula. The patient estimates these problems have been going on for several months. The patient is unaware of any other change.  Patient denies pain or tenderness overlying the access.  There is no pain with dialysis.  The patient denies hand pain or finger pain consistent with steal syndrome.   There have not been any past interventions or declots of this access.  However, previous fistulogram showed multiple large tributaries siphoning flow from the cephalic vein proper also there appears to be a hemodynamically significant clamp injury noted in the radial artery just proximal to the actual anastomosis.    The patient is not chronically hypotensive on dialysis.  Current Facility-Administered Medications  Medication Dose Route Frequency Provider Last Rate Last Admin  . 0.9 %  sodium chloride infusion   Intravenous Continuous Kris Hartmann, NP      . ceFAZolin (ANCEF) IVPB 1 g/50 mL premix  1 g Intravenous Once Kris Hartmann, NP      . diphenhydrAMINE (BENADRYL) injection 50 mg  50 mg Intravenous Once PRN Kris Hartmann, NP      . famotidine (PEPCID) tablet 40 mg  40 mg Oral Once PRN Kris Hartmann, NP      . HYDROmorphone (DILAUDID) injection 1 mg  1 mg Intravenous Once PRN Eulogio Ditch E, NP      . methylPREDNISolone sodium succinate (SOLU-MEDROL) 125 mg/2 mL injection 125 mg  125 mg Intravenous Once PRN Eulogio Ditch E, NP      . midazolam (VERSED) 2 MG/ML syrup 8 mg  8 mg Oral Once PRN Kris Hartmann, NP      . ondansetron (ZOFRAN) injection 4 mg  4 mg Intravenous Q6H PRN Kris Hartmann, NP         Past Medical History:  Diagnosis Date  . Anxiety   . Chronic kidney disease    14% FUNCTION  . Depression   . Diabetes mellitus without complication ([place])   . Dyspnea    DOE  . Dysrhythmia   . History of orthopnea   . Hypertension   . Lymphedema of leg   . Microalbuminuria   . Sleep apnea    no CPAP  . Vitamin D deficiency     Past Surgical History:  Procedure Laterality Date  . A/V FISTULAGRAM Right 10/24/2018   Procedure: A/V FISTULAGRAM;  Surgeon: Katha Cabal, MD;  Location: [facility];  Service: Cardiovascular;  Laterality: Right;  . A/V FISTULAGRAM Right 11/24/2018   Procedure: A/V FISTULAGRAM;  Surgeon: Katha Cabal, MD;  Location: [facility];  Service: Cardiovascular;  Laterality: Right;  . A/V FISTULAGRAM Left 07/31/2019   Procedure: A/V FISTULAGRAM;  Surgeon: Katha Cabal, MD;  Location: [facility];  Service: Cardiovascular;  Laterality: Left;  . A/V SHUNT INTERVENTION Right 02/21/2019   Procedure: A/V SHUNT INTERVENTION;  Surgeon: Katha Cabal, MD;  Location: [facility];  Service: Cardiovascular;  Laterality: Right;  . APPLICATION OF WOUND VAC Left 10/03/2017   Procedure: APPLICATION OF WOUND VAC;  Surgeon: Algernon Huxley, MD;  Location:  [facility];  Service: General;  Laterality: Left;  . APPLICATION OF WOUND VAC Left 10/11/2017   Procedure: APPLICATION OF WOUND VAC;  Surgeon: Katha Cabal, MD;  Location: [facility];  Service: Vascular;  Laterality: Left;  . APPLICATION OF WOUND VAC Left 10/14/2017   Procedure: WOUND VAC CHANGE;  Surgeon: Katha Cabal, MD;  Location: [facility];  Service: Vascular;  Laterality: Left;  left lower leg  . APPLICATION OF WOUND VAC Left 10/18/2017   Procedure: WOUND VAC CHANGE;  Surgeon: Katha Cabal, MD;  Location: [facility];  Service: Vascular;  Laterality: Left;  . APPLICATION OF WOUND VAC Left 10/07/2017   Procedure: APPLICATION OF WOUND VAC;  Surgeon: Katha Cabal, MD;  Location: [facility];  Service: Vascular;  Laterality: Left;  . AV FISTULA INSERTION W/ RF MAGNETIC GUIDANCE Right 07/19/2018   Procedure: AV FISTULA INSERTION W/RF MAGNETIC GUIDANCE;  Surgeon: Katha Cabal, MD;  Location: [facility];  Service: Cardiovascular;  Laterality: Right;  . AV FISTULA PLACEMENT Left 05/11/2019   Procedure: ARTERIOVENOUS (AV) FISTULA CREATION (RADIOCEPHALIC );  Surgeon: Katha Cabal, MD;  Location: [facility];  Service: Vascular;  Laterality: Left;  . CATARACT EXTRACTION W/PHACO Left 11/01/2018   Procedure: CATARACT EXTRACTION PHACO AND INTRAOCULAR LENS PLACEMENT (IOC)-LEFT, DIABETIC-INSULIN DEPENDENT;  Surgeon: Eulogio Bear, MD;  Location: [facility];  Service: Ophthalmology;  Laterality: Left;  [place] 00:41.8 CDE 4.61 Fluid Pack Lot # [id number]  . CATARACT EXTRACTION W/PHACO Right 04/27/2019   Procedure: CATARACT EXTRACTION PHACO AND INTRAOCULAR LENS PLACEMENT (IOC);  Surgeon: Eulogio Bear, MD;  Location: [facility];  Service: Ophthalmology;  Laterality: Right;  [place] 00:34 CDE 1.97 FLUID PACK LOT # [id number]   . DIALYSIS/PERMA CATHETER INSERTION N/A 02/23/2019   Procedure: DIALYSIS/PERMA CATHETER INSERTION;  Surgeon: Algernon Huxley, MD;  Location: [facility];  Service: Cardiovascular;  Laterality: N/A;  . I & D EXTREMITY Left 10/11/2017   Procedure: IRRIGATION AND DEBRIDEMENT EXTREMITY;  Surgeon: Katha Cabal, MD;  Location: [facility];  Service: Vascular;  Laterality: Left;  . INCISION AND DRAINAGE ABSCESS Right 10/07/2017   Procedure: INCISION AND DRAINAGE ABSCESS;  Surgeon: Katha Cabal, MD;  Location: [facility];  Service: Vascular;  Laterality: Right;  . IRRIGATION AND DEBRIDEMENT ABSCESS Left 10/03/2017   Procedure: IRRIGATION AND DEBRIDEMENT ABSCESS with debridement of skin, soft tissue, muscle 50sq cm;  Surgeon: Algernon Huxley, MD;  Location: [facility];  Service: General;  Laterality: Left;  . TEMPORARY DIALYSIS CATHETER   02/20/2019   Procedure: TEMPORARY DIALYSIS CATHETER;  Surgeon: Katha Cabal, MD;  Location: [facility];  Service: Cardiovascular;;    Social History Social History   Tobacco Use  . Smoking status: Former Smoker    Types: Cigars  . Smokeless tobacco: Former Systems developer    Quit date: 09/30/2017  . Tobacco comment: occasional  Substance Use Topics  . Alcohol use: Yes    Alcohol/week: 1.0 standard drinks    Types: 1 Cans of beer per week    Comment: beer  . Drug use: Not Currently    Family History Family History  Problem Relation Age of Onset  . Diabetes Father   . Kidney disease Father   . Diabetes Daughter     No family history of bleeding or clotting disorders, autoimmune disease or porphyria  Allergies  Allergen Reactions  . Lisinopril Swelling  . Adhesive [Tape] Itching  . Furosemide Cough    Light  headed, blurry vision, weakness.     REVIEW OF SYSTEMS (Negative unless checked)  Constitutional: [] Weight loss  [] Fever  [] Chills Cardiac: [] Chest pain   [] Chest pressure   [] Palpitations   [] Shortness of breath when laying flat   [] Shortness of breath at rest   [x] Shortness of breath with exertion. Vascular:  [] Pain in legs with walking   [] Pain in legs at rest   [] Pain in legs when laying flat   [] Claudication   [] Pain in feet when walking  [] Pain in feet at rest  [] Pain in feet when laying flat   [] History of DVT   [] Phlebitis   [] Swelling in legs   [] Varicose veins   [] Non-healing ulcers Pulmonary:   [] Uses home oxygen   [] Productive cough   [] Hemoptysis   [] Wheeze  [] COPD   [] Asthma Neurologic:  [] Dizziness  [] Blackouts   [] Seizures   [] History of stroke   [] History of TIA  [] Aphasia   [] Temporary blindness   [] Dysphagia   [] Weakness or numbness in arms   [] Weakness or numbness in legs Musculoskeletal:  [] Arthritis   [] Joint swelling   [] Joint pain   [] Low back pain Hematologic:  [] Easy bruising  [] Easy bleeding   [] Hypercoagulable state   [] Anemic   [] Hepatitis Gastrointestinal:  [] Blood in stool   [] Vomiting blood  [] Gastroesophageal reflux/heartburn   [] Difficulty swallowing. Genitourinary:  [x] Chronic kidney disease   [] Difficult urination  [] Frequent urination  [] Burning with urination   [] Blood in urine Skin:  [] Rashes   [] Ulcers   [] Wounds Psychological:  [] History of anxiety   []  History of major depression.  Physical Examination  Vitals:   09/18/19 1108  BP: (!) 148/74  Pulse: 79  Resp: (!) 22  Temp: 98.2 F (36.8 C)  TempSrc: Oral  SpO2: 100%  Weight: (!) 170.1 kg  Height: 6\' 4"  (1.93 m)   Body mass index is 45.65 kg/m. Gen: WD/WN, NAD Head: [place]/AT, No temporalis wasting. Prominent temp pulse not noted. Ear/Nose/Throat: Hearing grossly intact, nares w/o erythema or drainage, oropharynx w/o Erythema/Exudate,  Eyes: Conjunctiva clear, sclera non-icteric Neck: Trachea midline.  No JVD.  Pulmonary:  Good air movement, respirations not labored, no use of accessory muscles.  Cardiac: RRR, normal S1, S2. Vascular: Left radial cephalic fistula with a weak thrill good bruit multiple enlarged tributaries associated with the cephalic vein Vessel Right Left  Radial Palpable Palpable  Ulnar Not Palpable Palpable  Brachial Palpable Palpable  Carotid Palpable, without bruit Palpable, without bruit  Gastrointestinal: soft, non-tender/non-distended. No guarding/reflex.  Musculoskeletal: M/S 5/5 throughout.  Extremities without ischemic changes.  No deformity or atrophy.  Neurologic: Sensation grossly intact in extremities.  Symmetrical.  Speech is fluent. Motor exam as listed above. Psychiatric: Judgment intact, Mood & affect appropriate for pt's clinical situation. Dermatologic: No rashes or ulcers noted.  No cellulitis or open wounds. Lymph : No Cervical, Axillary, or Inguinal lymphadenopathy.   CBC Lab Results  Component Value Date   WBC 5.7 05/08/2019   HGB 14.3 05/11/2019   HCT 42.0 05/11/2019   MCV 96.6 05/08/2019    PLT 199 05/08/2019    BMET    Component Value Date/Time   NA 136 05/11/2019 0617   K 4.0 05/11/2019 0617   CL 98 05/08/2019 1333   CO2 23 05/08/2019 1333   GLUCOSE 216 (H) 05/11/2019 0617   BUN 56 (H) 05/08/2019 1333   CREATININE 12.50 (H) 05/08/2019 1333   CALCIUM 8.6 (L) 05/08/2019 1333   GFRNONAA 4 (L) 05/08/2019 1333  GFRAA 5 (L) 05/08/2019 1333   CrCl cannot be calculated (Patient's most recent lab result is older than the maximum 21 days allowed.).  COAG Lab Results  Component Value Date   INR 1.0 05/08/2019   INR 1.0 02/22/2019   INR 1.09 07/14/2018    Radiology No results found.  Assessment/Plan 1.  Complication dialysis device with thrombosis AV access:  Patient's left wrist dialysis access is malfunctioning. The patient will undergo angiography and correction of any problems using interventional techniques with the hope of restoring function to the access.  This will be approached via the distalmost radial or the distal brachial as needed.  That will allow for treatment of the clamp injury in the radial artery after which we can negotiate a catheter into the fistula and then into the large tributaries and embolized those with Ruby coils.  The risks and benefits were described to the patient.  All questions were answered.  The patient agrees to proceed with angiography and intervention. Potassium will be drawn to ensure that it is an appropriate level prior to performing intervention. 2.  End-stage renal disease requiring hemodialysis:  Patient will continue dialysis therapy without further interruption if a successful intervention is not achieved then a tunneled catheter will be placed. Dialysis has already been arranged. 3.  Hypertension:  Patient will continue medical management; nephrology is following no changes in oral medications. 4. Diabetes mellitus:  Glucose will be monitored and oral medications been held this morning once the patient has undergone the  patient's procedure po intake will be reinitiated and again Accu-Cheks will be used to assess the blood glucose level and treat as needed. The patient will be restarted on the patient's usual hypoglycemic regime    [name], MD  09/18/2019 11:59 AM

## 2019-09-18 NOTE — Anesthesia Procedure Notes (Signed)
Date/Time: 09/18/2019 12:50 PM Performed by: Johnna Acosta, CRNA Pre-anesthesia Checklist: Patient identified, Emergency Drugs available, Suction available, Patient being monitored and Timeout performed Patient Re-evaluated:Patient Re-evaluated prior to induction Oxygen Delivery Method: Simple face mask Preoxygenation: Pre-oxygenation with 100% oxygen Induction Type: IV induction

## 2019-09-18 NOTE — Anesthesia Preprocedure Evaluation (Signed)
Anesthesia Evaluation  Patient identified by MRN, date of birth, ID band Patient awake    Reviewed: Allergy & Precautions, NPO status , Patient's Chart, lab work & pertinent test results  History of Anesthesia Complications Negative for: history of anesthetic complications  Airway Mallampati: III  TM Distance: >3 FB Neck ROM: Full   Comment: Large neck Dental no notable dental hx. (+) Teeth Intact, Dental Advisory Given   Pulmonary shortness of breath, sleep apnea , neg COPD, Patient abstained from smoking.Not current smoker, former smoker,    Pulmonary exam normal breath sounds clear to auscultation       Cardiovascular Exercise Tolerance: Good METShypertension, Pt. on medications (-) CAD and (-) Past MI (-) dysrhythmias  Rhythm:Regular Rate:Normal - Systolic murmurs    Neuro/Psych PSYCHIATRIC DISORDERS Anxiety Depression negative neurological ROS     GI/Hepatic neg GERD  ,(+)     (-) substance abuse  ,   Endo/Other  diabetes  Renal/GU ESRF and DialysisRenal disease     Musculoskeletal   Abdominal (+) + obese,   Peds  Hematology   Anesthesia Other Findings Past Medical History: No date: Anxiety No date: Chronic kidney disease     Comment:  14% FUNCTION No date: Depression No date: Diabetes mellitus without complication ([facility]) No date: Dyspnea     Comment:  DOE No date: Dysrhythmia No date: History of orthopnea No date: Hypertension No date: Lymphedema of leg No date: Microalbuminuria No date: Sleep apnea     Comment:  no CPAP No date: Vitamin D deficiency  Reproductive/Obstetrics                             Anesthesia Physical Anesthesia Plan  ASA: IV  Anesthesia Plan: MAC and General   Post-op Pain Management:    Induction: Intravenous  PONV Risk Score and Plan: 2 and Midazolam and Ondansetron  Airway Management Planned: Natural Airway and Oral ETT  Additional  Equipment: None  Intra-op Plan:   Post-operative Plan: Extubation in OR  Informed Consent: I have reviewed the patients History and Physical, chart, labs and discussed the procedure including the risks, benefits and alternatives for the proposed anesthesia with the patient or authorized representative who has indicated his/her understanding and acceptance.     Dental advisory given  Plan Discussed with: CRNA and Surgeon  Anesthesia Plan Comments: (Discussed risks of anesthesia with patient, including PONV, sore throat, lip/dental damage. Rare risks discussed as well, such as cardiorespiratory sequelae. Patient understands.  Discussed MAC vs GETA backup.)        Anesthesia Quick Evaluation

## 2019-09-18 NOTE — Discharge Instructions (Signed)
Radial Site Care  This sheet gives you information about how to care for yourself after your procedure. Your health care provider may also give you more specific instructions. If you have problems or questions, contact your health care provider. What can I expect after the procedure? After the procedure, it is common to have:  Bruising and tenderness at the catheter insertion area. Follow these instructions at home: Medicines  Take over-the-counter and prescription medicines only as told by your health care provider. Insertion site care  Follow instructions from your health care provider about how to take care of your insertion site. Make sure you: ? Wash your hands with soap and water before you change your bandage (dressing). If soap and water are not available, use hand sanitizer. ? Change your dressing as told by your health care provider. ? Leave stitches (sutures), skin glue, or adhesive strips in place. These skin closures may need to stay in place for 2 weeks or longer. If adhesive strip edges start to loosen and curl up, you may trim the loose edges. Do not remove adhesive strips completely unless your health care provider tells you to do that.  Check your insertion site every day for signs of infection. Check for: ? Redness, swelling, or pain. ? Fluid or blood. ? Pus or a bad smell. ? Warmth.  Do not take baths, swim, or use a hot tub until your health care provider approves.  You may shower 24-48 hours after the procedure, or as directed by your health care provider. ? Remove the dressing and gently wash the site with plain soap and water. ? Pat the area dry with a clean towel. ? Do not rub the site. That could cause bleeding.  Do not apply powder or lotion to the site. Activity   For 24 hours after the procedure, or as directed by your health care provider: ? Do not flex or bend the affected arm. ? Do not push or pull heavy objects with the affected arm. ? Do not  drive yourself home from the hospital or clinic. You may drive 24 hours after the procedure unless your health care provider tells you not to. ? Do not operate machinery or power tools.  Do not lift anything that is heavier than 10 lb (4.5 kg), or the limit that you are told, until your health care provider says that it is safe.  Ask your health care provider when it is okay to: ? Return to work or school. ? Resume usual physical activities or sports. ? Resume sexual activity. General instructions  If the catheter site starts to bleed, raise your arm and put firm pressure on the site. If the bleeding does not stop, get help right away. This is a medical emergency.  If you went home on the same day as your procedure, a responsible adult should be with you for the first 24 hours after you arrive home.  Keep all follow-up visits as told by your health care provider. This is important. Contact a health care provider if:  You have a fever.  You have redness, swelling, or yellow drainage around your insertion site. Get help right away if:  You have unusual pain at the radial site.  The catheter insertion area swells very fast.  The insertion area is bleeding, and the bleeding does not stop when you hold steady pressure on the area.  Your arm or hand becomes pale, cool, tingly, or numb. These symptoms may represent a serious problem   that is an emergency. Do not wait to see if the symptoms will go away. Get medical help right away. Call your local emergency services (911 in the [place] Do not drive yourself to the hospital. Summary  After the procedure, it is common to have bruising and tenderness at the site.  Follow instructions from your health care provider about how to take care of your radial site wound. Check the wound every day for signs of infection.  Do not lift anything that is heavier than 10 lb (4.5 kg), or the limit that you are told, until your health care provider says  that it is safe. This information is not intended to replace advice given to you by your health care provider. Make sure you discuss any questions you have with your health care provider. Document Revised: 09/21/2017 Document Reviewed: 09/21/2017 [employer]vier Patient Education  2020 [employer]  

## 2019-09-18 NOTE — Op Note (Signed)
[facility]  Percutaneous Study/Intervention Procedural Note   Date of Surgery: 09/18/2019,4:50 PM  Surgeon:Sumire Halbleib, Dolores Lory   Pre-operative Diagnosis: Failure of left radiocephalic fistula to mature; stricture of radial artery proximal to the fistula anastomosis; multiple tributaries siphoning blood from the cephalic vein inhibiting fistula maturation  Post-operative diagnosis:  Same  Procedure(s) Performed:  1.  Contrast injection left radial artery  2.  Percutaneous transluminal angioplasty left radial artery to 4 mm with Lutonix drug-eluting balloon  3.  Contrast injection left radiocephalic fistula  4.  Percutaneous transluminal angioplasty left radiocephalic fistula to 6 mm with a Lutonix drug-eluting balloon  5.  Coil embolization 2 distinct large tributaries 1 located at the level of the wrist just a few centimeters from the anastomosis and the second located at the level of the antecubital fossa   Anesthesia: MAC anesthesia  Sheath: 5 French halo sheath retrograde left radial artery  Contrast: 40 cc   Fluoroscopy Time: 15.2 minutes  Indications: [name] presents with failure of his fistula to mature.  A recent fistulogram demonstrated a stricture of the radial artery just proximal to the anastomosis as well as multiple large tributaries which appear to be inhibiting full maturation of the cephalic vein as well as some narrowing of the fistula itself just above the anastomosis.  Risks and benefits for intervention was reviewed all questions were answered it was discussed with the patient this week will require arterial access and arteriography followed by negotiating the catheter into the fistula to complete the procedure.  All questions were answered and the patient has agreed to proceed.  Procedure:  [name] is a 49 y.o. male who was identified and appropriate procedural time out was performed.  The patient was then placed supine on the table  and after adequate MAC anesthesia had been induced his arm was prepped and draped in the usual sterile fashion with his left arm extended palm upward.    Ultrasound was used to evaluate the distal left radial artery.  It was echolucent and pulsatile indicating it is patent .  An ultrasound image was acquired for the permanent record.  A micropuncture needle was used to access the left radial artery under direct ultrasound guidance.  The microwire was then advanced under fluoroscopic guidance without difficulty followed by the micro-sheath.  A 0.035 J wire was advanced without resistance and a 5Fr halo sheath was placed.    Hand-injection contrast then again reimaged the 80% stenosis of the radial artery just proximal to the fistula anastomosis.  This is most consistent with a clamp injury.  V 18 wire was then negotiated through this lesion and positioned more proximally in the radial artery.  3000 units of heparin was given  Initially a 3 mm x 40 mm ultra versed balloon was used to angioplasty the stricture subsequently a 4 mm x 40 mm Lutonix drug-eluting balloon was advanced across the lesion inflated to 12 atm for 1 full minute.  Follow-up imaging demonstrated some spasm just proximal to where the balloon was positioned.  Approximately 200 mcg of nitroglycerin was given intra-arterially and follow-up imaging now demonstrated complete resolution of the spasm there is less than 10% residual stenosis located at the site of the previous stricture.  Given this excellent result attention was then turned to the fistula itself.  Kumpe catheter was then used to negotiate the wire and catheter into the fistula proper.  The first large tributary which was located near the wrist was  then engaged hand-injection contrast verified the anatomy and a prograde catheter was advanced through the Kumpe.  4 standard Ruby coils were then deployed a 6 mm x 30 cm, 6 mm x 20 cm, 6 mm x 30 cm, and a 6 mm x 20 cm.  Follow-up  imaging demonstrated absence of flow distally and the Kumpe catheter was pulled back into the fistula.  The second large branch which appeared to be problematic was located much more proximal on the forearm it demonstrated significant retrograde filling down toward the wrist through a series of venous tributaries.  Given this retrograde flow I felt that it was appropriate to coil embolize this branch as well on the Kumpe catheter and an advantage wire were negotiated into this tributary followed by the prograde catheter.  2 standard 6 mm x 30 cm Ruby coils were deployed without difficulty and again follow-up imaging demonstrated cessation of flow distally.  The V 18 wire was then reintroduced.  Imaging of the anastomosis was then performed demonstrating a greater than 60% stenosis just above the anastomosis and a 6 mm x 40 mm Lutonix drug-eluting balloon was positioned across this lesion inflated to 8 atm for 1 minute.  Follow-up imaging demonstrated some spasm distal to where the balloon was in another 200 mcg of nitroglycerin were instilled otherwise the stricture was now significantly improved with less than 15% residual stenosis.  The sheath was removed and a TR band was placed.  There were no immediate complications.  Findings:   Left Upper Extremity and fistulogram: As noted previously a greater than 80% stenosis of the radial artery was identified this was treated with with 4 mm balloon inflation yielding less than 10% residual stenosis.  Likewise a greater than 60% stenosis of the fistula was noted and this was treated with a 6 mm balloon inflation yielding less than 15% residual stenosis.  2 large tributaries which appear to be impeding maturation were also's selected and treated successfully with coil embolization.    Disposition: Patient was taken to the recovery room in stable condition having tolerated the procedure well.  Belenda Cruise Naftoli Penny 09/18/2019,4:50 PM

## 2019-09-19 ENCOUNTER — Encounter: Payer: Self-pay | Admitting: Cardiology

## 2019-09-19 LAB — GLUCOSE, CAPILLARY: Glucose-Capillary: 98 mg/dL (ref 70–99)

## 2019-10-08 ENCOUNTER — Other Ambulatory Visit (INDEPENDENT_AMBULATORY_CARE_PROVIDER_SITE_OTHER): Payer: Self-pay | Admitting: Vascular Surgery

## 2019-10-08 DIAGNOSIS — Z9862 Peripheral vascular angioplasty status: Secondary | ICD-10-CM

## 2019-10-08 DIAGNOSIS — T82590S Other mechanical complication of surgically created arteriovenous fistula, sequela: Secondary | ICD-10-CM

## 2019-10-08 DIAGNOSIS — N186 End stage renal disease: Secondary | ICD-10-CM

## 2019-10-11 ENCOUNTER — Ambulatory Visit (INDEPENDENT_AMBULATORY_CARE_PROVIDER_SITE_OTHER): Payer: Medicare Other | Admitting: Vascular Surgery

## 2019-10-11 ENCOUNTER — Ambulatory Visit (INDEPENDENT_AMBULATORY_CARE_PROVIDER_SITE_OTHER): Payer: Medicare Other

## 2019-10-11 ENCOUNTER — Other Ambulatory Visit: Payer: Self-pay

## 2019-10-11 ENCOUNTER — Encounter (INDEPENDENT_AMBULATORY_CARE_PROVIDER_SITE_OTHER): Payer: Self-pay | Admitting: Vascular Surgery

## 2019-10-11 ENCOUNTER — Other Ambulatory Visit (INDEPENDENT_AMBULATORY_CARE_PROVIDER_SITE_OTHER): Payer: Medicare Other

## 2019-10-11 VITALS — BP 146/80 | HR 75 | Resp 12 | Ht 76.0 in | Wt 375.0 lb

## 2019-10-11 DIAGNOSIS — Z9862 Peripheral vascular angioplasty status: Secondary | ICD-10-CM | POA: Diagnosis not present

## 2019-10-11 DIAGNOSIS — I10 Essential (primary) hypertension: Secondary | ICD-10-CM | POA: Diagnosis not present

## 2019-10-11 DIAGNOSIS — T82590S Other mechanical complication of surgically created arteriovenous fistula, sequela: Secondary | ICD-10-CM | POA: Diagnosis not present

## 2019-10-11 DIAGNOSIS — T829XXS Unspecified complication of cardiac and vascular prosthetic device, implant and graft, sequela: Secondary | ICD-10-CM | POA: Diagnosis not present

## 2019-10-11 DIAGNOSIS — E1059 Type 1 diabetes mellitus with other circulatory complications: Secondary | ICD-10-CM

## 2019-10-11 DIAGNOSIS — N186 End stage renal disease: Secondary | ICD-10-CM

## 2019-10-11 DIAGNOSIS — I872 Venous insufficiency (chronic) (peripheral): Secondary | ICD-10-CM

## 2019-10-18 ENCOUNTER — Encounter (INDEPENDENT_AMBULATORY_CARE_PROVIDER_SITE_OTHER): Payer: Self-pay | Admitting: Vascular Surgery

## 2019-10-18 NOTE — Progress Notes (Signed)
MRN : [id number]  [name] is a 49 y.o. (1970-11-29) male who presents with chief complaint of  Chief Complaint  Patient presents with  . Follow-up    2 wk post UE Angio ; HDA   .  History of Present Illness:   The patient returns to the office for followup status post intervention of the dialysis access. Following the intervention the access function has significantly improved, with better flow rates and improved KT/V. The patient has not been experiencing increased bleeding times following decannulation and the patient denies increased recirculation. The patient denies an increase in arm swelling. At the present time the patient denies hand pain.  The patient denies amaurosis fugax or recent TIA symptoms. There are no recent neurological changes noted. The patient denies claudication symptoms or rest pain symptoms. The patient denies history of DVT, PE or superficial thrombophlebitis. The patient denies recent episodes of angina or shortness of breath.     Current Meds  Medication Sig  . acetaminophen (TYLENOL) 500 MG tablet Take 500 mg by mouth every 6 (six) hours as needed for moderate pain or headache.  [name] apixaban (ELIQUIS) 5 MG TABS tablet Take 5 mg by mouth 2 (two) times daily.   . Ascorbic Acid (VITAMIN C WITH ROSE HIPS) 1000 MG tablet Take 1,000 mg by mouth 2 (two) times a week.   . B Complex-C-Folic Acid (RENA-VITE RX) 1 MG TABS Take 1 tablet by mouth daily.  . calcium acetate (PHOSLO) 667 MG capsule Take 667-1,334 mg by mouth See admin instructions. Take 2 capsules (1334 mg) by mouth with each meal & take 1 capsule (667 mg) by mouth with each snack.  . cinacalcet (SENSIPAR) 30 MG tablet Take 30 mg by mouth daily.  [name] CINNAMON PO Take 1,000 mg by mouth 2 (two) times daily.  [name] ibuprofen (ADVIL) 800 MG tablet Take 800 mg by mouth every 8 (eight) hours as needed for moderate pain.   . Insulin Glargine (LANTUS SOLOSTAR) 100 UNIT/ML Solostar Pen Inject 14 Units into the  skin 2 (two) times daily.   [name] loratadine (CLARITIN) 10 MG tablet Take 10 mg by mouth daily as needed for allergies.  . Melatonin 10 MG CAPS Take 10 mg by mouth at bedtime.   . metolazone (ZAROXOLYN) 5 MG tablet Take 5 mg by mouth every Monday, Wednesday, and Friday.   . Multiple Vitamin (MULTIVITAMIN WITH MINERALS) TABS tablet Take 1 tablet by mouth every Tuesday, Thursday, and Saturday at 6 PM. Men's Multivitamin  . Multiple Vitamins-Minerals (EYE HEALTH PO) Take 1 capsule by mouth daily.   . Omega-3 1000 MG CAPS Take 1,000 mg by mouth daily.    Past Medical History:  Diagnosis Date  . Anxiety   . Chronic kidney disease    14% FUNCTION  . Depression   . Diabetes mellitus without complication ([place])   . Dyspnea    DOE  . Dysrhythmia   . History of orthopnea   . Hypertension   . Lymphedema of leg   . Microalbuminuria   . Sleep apnea    no CPAP  . Vitamin D deficiency     Past Surgical History:  Procedure Laterality Date  . A/V FISTULAGRAM Right 10/24/2018   Procedure: A/V FISTULAGRAM;  Surgeon: Katha Cabal, MD;  Location: [place] [facility];  Service: Cardiovascular;  Laterality: Right;  . A/V FISTULAGRAM Right 11/24/2018   Procedure: A/V FISTULAGRAM;  Surgeon: Katha Cabal, MD;  Location[facility];  Service: Cardiovascular;  Laterality: Right;  . A/V FISTULAGRAM Left 07/31/2019   Procedure: A/V FISTULAGRAM;  Surgeon: Katha Cabal, MD;  Location: [place] [facility];  Service: Cardiovascular;  Laterality: Left;  . A/V SHUNT INTERVENTION Right 02/21/2019   Procedure: A/V SHUNT INTERVENTION;  Surgeon: Katha Cabal, MD;  Location: [place] [facility];  Service: Cardiovascular;  Laterality: Right;  . APPLICATION OF WOUND VAC Left 10/03/2017   Procedure: APPLICATION OF WOUND VAC;  Surgeon: Algernon Huxley, MD;  Location: [facility];  Service: General;  Laterality: Left;  . APPLICATION OF WOUND VAC Left 10/11/2017   Procedure: APPLICATION OF WOUND VAC;   Surgeon: Katha Cabal, MD;  Location: [facility];  Service: Vascular;  Laterality: Left;  . APPLICATION OF WOUND VAC Left 10/14/2017   Procedure: WOUND VAC CHANGE;  Surgeon: Katha Cabal, MD;  Location: [facility];  Service: Vascular;  Laterality: Left;  left lower leg  . APPLICATION OF WOUND VAC Left 10/18/2017   Procedure: WOUND VAC CHANGE;  Surgeon: Katha Cabal, MD;  Location: [facility];  Service: Vascular;  Laterality: Left;  . APPLICATION OF WOUND VAC Left 10/07/2017   Procedure: APPLICATION OF WOUND VAC;  Surgeon: Katha Cabal, MD;  Location: [facility];  Service: Vascular;  Laterality: Left;  . AV FISTULA INSERTION W/ RF MAGNETIC GUIDANCE Right 07/19/2018   Procedure: AV FISTULA INSERTION W/RF MAGNETIC GUIDANCE;  Surgeon: Katha Cabal, MD;  Location: [place] [facility];  Service: Cardiovascular;  Laterality: Right;  . AV FISTULA PLACEMENT Left 05/11/2019   Procedure: ARTERIOVENOUS (AV) FISTULA CREATION (RADIOCEPHALIC );  Surgeon: Katha Cabal, MD;  Location: [facility];  Service: Vascular;  Laterality: Left;  . CATARACT EXTRACTION W/PHACO Left 11/01/2018   Procedure: CATARACT EXTRACTION PHACO AND INTRAOCULAR LENS PLACEMENT (IOC)-LEFT, DIABETIC-INSULIN DEPENDENT;  Surgeon: Eulogio Bear, MD;  Location: [facility];  Service: Ophthalmology;  Laterality: Left;  [place] 00:41.8 CDE 4.61 Fluid Pack Lot # [id number]  . CATARACT EXTRACTION W/PHACO Right 04/27/2019   Procedure: CATARACT EXTRACTION PHACO AND INTRAOCULAR LENS PLACEMENT (IOC);  Surgeon: Eulogio Bear, MD;  Location: [facility];  Service: Ophthalmology;  Laterality: Right;  [place] 00:34 CDE 1.97 FLUID PACK LOT # [id number]   . DIALYSIS/PERMA CATHETER INSERTION N/A 02/23/2019   Procedure: DIALYSIS/PERMA CATHETER INSERTION;  Surgeon: Algernon Huxley, MD;  Location: [place] [facility];  Service: Cardiovascular;  Laterality: N/A;  . I & D EXTREMITY Left 10/11/2017   Procedure: IRRIGATION AND DEBRIDEMENT EXTREMITY;  Surgeon:  Katha Cabal, MD;  Location: [facility];  Service: Vascular;  Laterality: Left;  . INCISION AND DRAINAGE ABSCESS Right 10/07/2017   Procedure: INCISION AND DRAINAGE ABSCESS;  Surgeon: Katha Cabal, MD;  Location: [facility];  Service: Vascular;  Laterality: Right;  . IRRIGATION AND DEBRIDEMENT ABSCESS Left 10/03/2017   Procedure: IRRIGATION AND DEBRIDEMENT ABSCESS with debridement of skin, soft tissue, muscle 50sq cm;  Surgeon: Algernon Huxley, MD;  Location: [facility];  Service: General;  Laterality: Left;  . TEMPORARY DIALYSIS CATHETER  02/20/2019   Procedure: TEMPORARY DIALYSIS CATHETER;  Surgeon: Katha Cabal, MD;  Location: [place] [facility];  Service: Cardiovascular;;  . UPPER EXTREMITY ANGIOGRAPHY Left 09/18/2019   Procedure: UPPER EXTREMITY ANGIOGRAPHY;  Surgeon: Katha Cabal, MD;  Location: [place] [facility];  Service: Cardiovascular;  Laterality: Left;    Social History Social History   Tobacco Use  . Smoking status: Former Smoker    Types:  Cigars  . Smokeless tobacco: Former Systems developer    Quit date: 09/30/2017  . Tobacco comment: occasional  Substance Use Topics  . Alcohol use: Yes    Alcohol/week: 1.0 standard drinks    Types: 1 Cans of beer per week    Comment: beer  . Drug use: Not Currently    Family History Family History  Problem Relation Age of Onset  . Diabetes Father   . Kidney disease Father   . Diabetes Daughter     Allergies  Allergen Reactions  . Lisinopril Swelling  . Adhesive [Tape] Itching  . Furosemide Cough    Light headed, blurry vision, weakness.     REVIEW OF SYSTEMS (Negative unless checked)  Constitutional: [] Weight loss  [] Fever  [] Chills Cardiac: [] Chest pain   [] Chest pressure   [] Palpitations   [] Shortness of breath when laying flat   [] Shortness of breath with exertion. Vascular:  [] Pain in legs with walking   [] Pain in legs at rest  [] History of DVT   [] Phlebitis   [x] Swelling in legs   [] Varicose veins   [] Non-healing  ulcers Pulmonary:   [] Uses home oxygen   [] Productive cough   [] Hemoptysis   [] Wheeze  [] COPD   [] Asthma Neurologic:  [] Dizziness   [] Seizures   [] History of stroke   [] History of TIA  [] Aphasia   [] Vissual changes   [] Weakness or numbness in arm   [] Weakness or numbness in leg Musculoskeletal:   [] Joint swelling   [] Joint pain   [] Low back pain Hematologic:  [] Easy bruising  [] Easy bleeding   [] Hypercoagulable state   [] Anemic Gastrointestinal:  [] Diarrhea   [] Vomiting  [] Gastroesophageal reflux/heartburn   [] Difficulty swallowing. Genitourinary:  [x] Chronic kidney disease   [] Difficult urination  [] Frequent urination   [] Blood in urine Skin:  [] Rashes   [] Ulcers  Psychological:  [] History of anxiety   []  History of major depression.  Physical Examination  Vitals:   10/11/19 1432  BP: (!) 146/80  Pulse: 75  Resp: 12  Weight: (!) 375 lb (170.1 kg)  Height: 6\' 4"  (1.93 m)   Body mass index is 45.65 kg/m. Gen: WD/WN, NAD Head: [place]/AT, No temporalis wasting.  Ear/Nose/Throat: Hearing grossly intact, nares w/o erythema or drainage Eyes: PER, EOMI, sclera nonicteric.  Neck: Supple, no large masses.   Pulmonary:  Good air movement, no audible wheezing bilaterally, no use of accessory muscles.  Cardiac: RRR, no JVD Vascular: massive edema left moderate right  No ulcers at this time severe venous stasis dermatitis  Vessel Right Left  Radial Palpable Palpable  Brachial Palpable Palpable  Gastrointestinal: Non-distended. No guarding/no peritoneal signs.  Musculoskeletal: M/S 5/5 throughout.  No deformity or atrophy.  Neurologic: CN 2-12 intact. Symmetrical.  Speech is fluent. Motor exam as listed above. Psychiatric: Judgment intact, Mood & affect appropriate for pt's clinical situation. Dermatologic: No rashes or ulcers noted.  No changes consistent with cellulitis.   CBC Lab Results  Component Value Date   WBC 5.7 05/08/2019   HGB 14.3 05/11/2019   HCT 42.0 05/11/2019   MCV 96.6  05/08/2019   PLT 199 05/08/2019    BMET    Component Value Date/Time   NA 136 05/11/2019 0617   K 4.0 05/11/2019 0617   CL 98 05/08/2019 1333   CO2 23 05/08/2019 1333   GLUCOSE 216 (H) 05/11/2019 0617   BUN 56 (H) 05/08/2019 1333   CREATININE 12.50 (H) 05/08/2019 1333   CALCIUM 8.6 (L) 05/08/2019 1333   GFRNONAA 4 (L) 05/08/2019  1333   GFRAA 5 (L) 05/08/2019 1333   CrCl cannot be calculated (Patient's most recent lab result is older than the maximum 21 days allowed.).  COAG Lab Results  Component Value Date   INR 1.0 05/08/2019   INR 1.0 02/22/2019   INR 1.09 07/14/2018    Radiology PERIPHERAL VASCULAR CATHETERIZATION  Result Date: 09/18/2019 See Op Note  VAS US DUPLEX DIALYSIS ACCESS (AVF, AVG)  Result Date: 10/11/2019 DIALYSIS ACCESS Reason for Exam: Routine follow up. Access Site: Left Upper Extremity. Access Type: Radial-cephalic AVF. Comparison Study: 06/07/2019 Performing Technologist: Charlane Ferretti RT (R)(VS)  Examination Guidelines: A complete evaluation includes B-mode imaging, spectral Doppler, color Doppler, and power Doppler as needed of all accessible portions of each vessel. Unilateral testing is considered an integral part of a complete examination. Limited examinations for reoccurring indications may be performed as noted.  Findings: +--------------------+----------+-----------------+--------------------+ AVF                 PSV (cm/s)Flow Vol (mL/min)      Comments       +--------------------+----------+-----------------+--------------------+ Native artery inflow   386          1006       Dual brachial system +--------------------+----------+-----------------+--------------------+ AVF Anastomosis        374                                          +--------------------+----------+-----------------+--------------------+  +------------+----------+-------------+----------+----------+ OUTFLOW VEINPSV (cm/s)Diameter (cm)Depth (cm) Describe   +------------+----------+-------------+----------+----------+ Prox UA         32                                      +------------+----------+-------------+----------+----------+ Mid UA          47                                      +------------+----------+-------------+----------+----------+ Dist UA         37                                      +------------+----------+-------------+----------+----------+ AC Fossa        93                           perforator +------------+----------+-------------+----------+----------+ Prox Forearm   153                                      +------------+----------+-------------+----------+----------+ Mid Forearm    163                                      +------------+----------+-------------+----------+----------+ Dist Forearm   272                                      +------------+----------+-------------+----------+----------+ Retrograde flow in the radial artery distal to the anastamosis.  Summary: Patent arteriovenous fistula.  Patent AVF with no evidence of stenosis.  *See table(s) above for measurements and observations.  Diagnosing physician: [name] MD Electronically signed by [name] MD on 10/11/2019 at 5:20:09 PM.   --------------------------------------------------------------------------------   Final      Assessment/Plan 1. Complication of vascular access for dialysis, sequela Recommend:  The patient is doing well and currently has adequate dialysis access. The patient's dialysis center is not reporting any access issues. Flow pattern is stable when compared to the prior ultrasound.  The patient should have a duplex ultrasound of the dialysis access in one month to see if we can begin cannulation.  The patient will follow-up with me in the office after each ultrasound     2. End stage renal disease ([place]) At the present time the patient has adequate dialysis access.  Continue  hemodialysis as ordered without interruption.  Avoid nephrotoxic medications and dehydration.  Further plans per nephrology  3. Chronic venous insufficiency  No surgery or intervention at this point in time.    I have reviewed my discussion with the patient regarding lymphedema and why it  causes symptoms.  Patient will continue wearing graduated compression stockings class 1 (20-30 mmHg) on a daily basis a prescription was given. The patient is reminded to put the stockings on first thing in the morning and removing them in the evening. The patient is instructed specifically not to sleep in the stockings.   In addition, behavioral modification throughout the day will be continued.  This will include frequent elevation (such as in a recliner), use of over the counter pain medications as needed and exercise such as walking.  I have reviewed systemic causes for chronic edema such as liver, kidney and cardiac etiologies and there does not appear to be any significant changes in these organ systems over the past year.  The patient is under the impression that these organ systems are all stable and unchanged.    The patient will continue aggressive use of the  lymph pump.  This will continue to improve the edema control and prevent sequela such as ulcers and infections.   The patient will follow-up with me on an annual basis.    4. Essential hypertension Continue antihypertensive medications as already ordered, these medications have been reviewed and there are no changes at this time.   5. Type 1 diabetes mellitus with other circulatory complication (HCC) Continue hypoglycemic medications as already ordered, these medications have been reviewed and there are no changes at this time.  Hgb A1C to be monitored as already arranged by primary service     [name], MD  10/18/2019 12:26 PM

## 2019-11-08 ENCOUNTER — Ambulatory Visit (INDEPENDENT_AMBULATORY_CARE_PROVIDER_SITE_OTHER): Payer: Medicare Other | Admitting: Vascular Surgery

## 2019-11-08 ENCOUNTER — Other Ambulatory Visit: Payer: Self-pay

## 2019-11-08 ENCOUNTER — Ambulatory Visit (INDEPENDENT_AMBULATORY_CARE_PROVIDER_SITE_OTHER): Payer: Medicare Other

## 2019-11-08 ENCOUNTER — Encounter (INDEPENDENT_AMBULATORY_CARE_PROVIDER_SITE_OTHER): Payer: Self-pay | Admitting: Vascular Surgery

## 2019-11-08 VITALS — BP 153/88 | HR 77 | Resp 16 | Wt 384.2 lb

## 2019-11-08 DIAGNOSIS — N186 End stage renal disease: Secondary | ICD-10-CM

## 2019-11-08 DIAGNOSIS — T829XXS Unspecified complication of cardiac and vascular prosthetic device, implant and graft, sequela: Secondary | ICD-10-CM

## 2019-11-08 DIAGNOSIS — E1059 Type 1 diabetes mellitus with other circulatory complications: Secondary | ICD-10-CM

## 2019-11-08 DIAGNOSIS — I872 Venous insufficiency (chronic) (peripheral): Secondary | ICD-10-CM

## 2019-11-08 DIAGNOSIS — I10 Essential (primary) hypertension: Secondary | ICD-10-CM | POA: Diagnosis not present

## 2019-11-08 NOTE — Progress Notes (Signed)
MRN : [id number]  [name] is a 49 y.o. (1971/01/26) male who presents with chief complaint of No chief complaint on file. [name]  History of Present Illness:  The patient is seen for evaluation of dialysis access.  The patient has a history of multiple failed accesses.  He has a left wrist fistula and is status post successful intervention.  Current access is via a catheter which is functioning poorly.  There have been several episodes of catheter infection.  The patient denies amaurosis fugax or recent TIA symptoms. There are no recent neurological changes noted. The patient denies claudication symptoms or rest pain symptoms. The patient denies history of DVT, PE or superficial thrombophlebitis. The patient denies recent episodes of angina or shortness of breath.    No outpatient medications have been marked as taking for the 11/08/19 encounter (Office Visit) with Delana Meyer, Dolores Lory, MD.    Past Medical History:  Diagnosis Date  . Anxiety   . Chronic kidney disease    14% FUNCTION  . Depression   . Diabetes mellitus without complication ([place])   . Dyspnea    DOE  . Dysrhythmia   . History of orthopnea   . Hypertension   . Lymphedema of leg   . Microalbuminuria   . Sleep apnea    no CPAP  . Vitamin D deficiency     Past Surgical History:  Procedure Laterality Date  . A/V FISTULAGRAM Right 10/24/2018   Procedure: A/V FISTULAGRAM;  Surgeon: Katha Cabal, MD;  Location: [place] [facility];  Service: Cardiovascular;  Laterality: Right;  . A/V FISTULAGRAM Right 11/24/2018   Procedure: A/V FISTULAGRAM;  Surgeon: Katha Cabal, MD;  Location: [place] [facility];  Service: Cardiovascular;  Laterality: Right;  . A/V FISTULAGRAM Left 07/31/2019   Procedure: A/V FISTULAGRAM;  Surgeon: Katha Cabal, MD;  Location: [place] [facility];  Service: Cardiovascular;  Laterality: Left;  . A/V SHUNT INTERVENTION Right 02/21/2019   Procedure: A/V SHUNT INTERVENTION;   Surgeon: Katha Cabal, MD;  Location: [place] [facility];  Service: Cardiovascular;  Laterality: Right;  . APPLICATION OF WOUND VAC Left 10/03/2017   Procedure: APPLICATION OF WOUND VAC;  Surgeon: Algernon Huxley, MD;  Location: [facility];  Service: General;  Laterality: Left;  . APPLICATION OF WOUND VAC Left 10/11/2017   Procedure: APPLICATION OF WOUND VAC;  Surgeon: Katha Cabal, MD;  Location: [facility];  Service: Vascular;  Laterality: Left;  . APPLICATION OF WOUND VAC Left 10/14/2017   Procedure: WOUND VAC CHANGE;  Surgeon: Katha Cabal, MD;  Location: [facility];  Service: Vascular;  Laterality: Left;  left lower leg  . APPLICATION OF WOUND VAC Left 10/18/2017   Procedure: WOUND VAC CHANGE;  Surgeon: Katha Cabal, MD;  Location: [facility];  Service: Vascular;  Laterality: Left;  . APPLICATION OF WOUND VAC Left 10/07/2017   Procedure: APPLICATION OF WOUND VAC;  Surgeon: Katha Cabal, MD;  Location: [facility];  Service: Vascular;  Laterality: Left;  . AV FISTULA INSERTION W/ RF MAGNETIC GUIDANCE Right 07/19/2018   Procedure: AV FISTULA INSERTION W/RF MAGNETIC GUIDANCE;  Surgeon: Katha Cabal, MD;  Location: [place] [facility];  Service: Cardiovascular;  Laterality: Right;  . AV FISTULA PLACEMENT Left 05/11/2019   Procedure: ARTERIOVENOUS (AV) FISTULA CREATION (RADIOCEPHALIC );  Surgeon: Katha Cabal, MD;  Location: [facility];  Service: Vascular;  Laterality: Left;  . CATARACT EXTRACTION W/PHACO Left 11/01/2018  Procedure: CATARACT EXTRACTION PHACO AND INTRAOCULAR LENS PLACEMENT (IOC)-LEFT, DIABETIC-INSULIN DEPENDENT;  Surgeon: Eulogio Bear, MD;  Location: [facility];  Service: Ophthalmology;  Laterality: Left;  [place] 00:41.8 CDE 4.61 Fluid Pack Lot # [id number]  . CATARACT EXTRACTION W/PHACO Right 04/27/2019   Procedure: CATARACT EXTRACTION PHACO AND INTRAOCULAR LENS PLACEMENT (IOC);  Surgeon: Eulogio Bear, MD;  Location: [facility];  Service: Ophthalmology;   Laterality: Right;  [place] 00:34 CDE 1.97 FLUID PACK LOT # [id number]   . DIALYSIS/PERMA CATHETER INSERTION N/A 02/23/2019   Procedure: DIALYSIS/PERMA CATHETER INSERTION;  Surgeon: Algernon Huxley, MD;  Location: [place] [facility];  Service: Cardiovascular;  Laterality: N/A;  . I & D EXTREMITY Left 10/11/2017   Procedure: IRRIGATION AND DEBRIDEMENT EXTREMITY;  Surgeon: Katha Cabal, MD;  Location: [facility];  Service: Vascular;  Laterality: Left;  . INCISION AND DRAINAGE ABSCESS Right 10/07/2017   Procedure: INCISION AND DRAINAGE ABSCESS;  Surgeon: Katha Cabal, MD;  Location: [facility];  Service: Vascular;  Laterality: Right;  . IRRIGATION AND DEBRIDEMENT ABSCESS Left 10/03/2017   Procedure: IRRIGATION AND DEBRIDEMENT ABSCESS with debridement of skin, soft tissue, muscle 50sq cm;  Surgeon: Algernon Huxley, MD;  Location: [facility];  Service: General;  Laterality: Left;  . TEMPORARY DIALYSIS CATHETER  02/20/2019   Procedure: TEMPORARY DIALYSIS CATHETER;  Surgeon: Katha Cabal, MD;  Location: [place] [facility];  Service: Cardiovascular;;  . UPPER EXTREMITY ANGIOGRAPHY Left 09/18/2019   Procedure: UPPER EXTREMITY ANGIOGRAPHY;  Surgeon: Katha Cabal, MD;  Location: [place] [facility];  Service: Cardiovascular;  Laterality: Left;    Social History Social History   Tobacco Use  . Smoking status: Former Smoker    Types: Cigars  . Smokeless tobacco: Former Systems developer    Quit date: 09/30/2017  . Tobacco comment: occasional  Substance Use Topics  . Alcohol use: Yes    Alcohol/week: 1.0 standard drinks    Types: 1 Cans of beer per week    Comment: beer  . Drug use: Not Currently    Family History Family History  Problem Relation Age of Onset  . Diabetes Father   . Kidney disease Father   . Diabetes Daughter     Allergies  Allergen Reactions  . Lisinopril Swelling  . Adhesive [Tape] Itching  . Furosemide Cough    Light headed, blurry vision, weakness.     REVIEW OF SYSTEMS  (Negative unless checked)  Constitutional: [] Weight loss  [] Fever  [] Chills Cardiac: [] Chest pain   [] Chest pressure   [] Palpitations   [] Shortness of breath when laying flat   [] Shortness of breath with exertion. Vascular:  [] Pain in legs with walking   [] Pain in legs at rest  [] History of DVT   [] Phlebitis   [x] Swelling in legs   [] Varicose veins   [] Non-healing ulcers Pulmonary:   [] Uses home oxygen   [] Productive cough   [] Hemoptysis   [] Wheeze  [] COPD   [] Asthma Neurologic:  [] Dizziness   [] Seizures   [] History of stroke   [] History of TIA  [] Aphasia   [] Vissual changes   [] Weakness or numbness in arm   [] Weakness or numbness in leg Musculoskeletal:   [] Joint swelling   [] Joint pain   [] Low back pain Hematologic:  [] Easy bruising  [] Easy bleeding   [] Hypercoagulable state   [] Anemic Gastrointestinal:  [] Diarrhea   [] Vomiting  [] Gastroesophageal reflux/heartburn   [] Difficulty swallowing. Genitourinary:  [x] Chronic kidney disease   [] Difficult urination  [] Frequent urination   [] Blood in  urine Skin:  [] Rashes   [] Ulcers  Psychological:  [] History of anxiety   []  History of major depression.  Physical Examination  Vitals:   11/08/19 1416  BP: (!) 153/88  Pulse: 77  Resp: 16  Weight: (!) 384 lb 3.2 oz (174.3 kg)   Body mass index is 46.77 kg/m. Gen: WD/WN, NAD Head: [place]/AT, No temporalis wasting.  Ear/Nose/Throat: Hearing grossly intact, nares w/o erythema or drainage Eyes: PER, EOMI, sclera nonicteric.  Neck: Supple, no large masses.   Pulmonary:  Good air movement, no audible wheezing bilaterally, no use of accessory muscles.  Cardiac: RRR, no JVD Vascular: Left wrist fistula good thrill good bruit.scattered varicosities present bilaterally.  Severe venous stasis changes to the legs bilaterally.  3-4+ soft pitting edema Vessel Right Left  Radial Palpable Palpable  Gastrointestinal: Non-distended. No guarding/no peritoneal signs.  Musculoskeletal: M/S 5/5 throughout.  No  deformity or atrophy.  Neurologic: CN 2-12 intact. Symmetrical.  Speech is fluent. Motor exam as listed above. Psychiatric: Judgment intact, Mood & affect appropriate for pt's clinical situation. Dermatologic: No rashes or ulcers noted.  No changes consistent with cellulitis. Lymph : No lichenification or skin changes of chronic lymphedema.  CBC Lab Results  Component Value Date   WBC 5.7 05/08/2019   HGB 14.3 05/11/2019   HCT 42.0 05/11/2019   MCV 96.6 05/08/2019   PLT 199 05/08/2019    BMET    Component Value Date/Time   NA 136 05/11/2019 0617   K 4.0 05/11/2019 0617   CL 98 05/08/2019 1333   CO2 23 05/08/2019 1333   GLUCOSE 216 (H) 05/11/2019 0617   BUN 56 (H) 05/08/2019 1333   CREATININE 12.50 (H) 05/08/2019 1333   CALCIUM 8.6 (L) 05/08/2019 1333   GFRNONAA 4 (L) 05/08/2019 1333   GFRAA 5 (L) 05/08/2019 1333   CrCl cannot be calculated (Patient's most recent lab result is older than the maximum 21 days allowed.).  COAG Lab Results  Component Value Date   INR 1.0 05/08/2019   INR 1.0 02/22/2019   INR 1.09 07/14/2018    Radiology VAS US DUPLEX DIALYSIS ACCESS (AVF, AVG)  Result Date: 10/11/2019 DIALYSIS ACCESS Reason for Exam: Routine follow up. Access Site: Left Upper Extremity. Access Type: Radial-cephalic AVF. Comparison Study: 06/07/2019 Performing Technologist: Charlane Ferretti RT (R)(VS)  Examination Guidelines: A complete evaluation includes B-mode imaging, spectral Doppler, color Doppler, and power Doppler as needed of all accessible portions of each vessel. Unilateral testing is considered an integral part of a complete examination. Limited examinations for reoccurring indications may be performed as noted.  Findings: +--------------------+----------+-----------------+--------------------+ AVF                 PSV (cm/s)Flow Vol (mL/min)      Comments       +--------------------+----------+-----------------+--------------------+ Native artery inflow   386           1006       Dual brachial system +--------------------+----------+-----------------+--------------------+ AVF Anastomosis        374                                          +--------------------+----------+-----------------+--------------------+  +------------+----------+-------------+----------+----------+ OUTFLOW VEINPSV (cm/s)Diameter (cm)Depth (cm) Describe  +------------+----------+-------------+----------+----------+ Prox UA         32                                      +------------+----------+-------------+----------+----------+  Mid UA          47                                      +------------+----------+-------------+----------+----------+ Dist UA         37                                      +------------+----------+-------------+----------+----------+ AC Fossa        93                           perforator +------------+----------+-------------+----------+----------+ Prox Forearm   153                                      +------------+----------+-------------+----------+----------+ Mid Forearm    163                                      +------------+----------+-------------+----------+----------+ Dist Forearm   272                                      +------------+----------+-------------+----------+----------+ Retrograde flow in the radial artery distal to the anastamosis. Summary: Patent arteriovenous fistula.  Patent AVF with no evidence of stenosis.  *See table(s) above for measurements and observations.  Diagnosing physician: [name] MD Electronically signed by [name] MD on 10/11/2019 at 5:20:09 PM.   --------------------------------------------------------------------------------   Final      Assessment/Plan 1. Complication of vascular access for dialysis, sequela Recommend:  The patient is doing well and currently has adequate dialysis access.  The patient's dialysis center is not reporting any access  issues.  I have personally examined the fistula with the ultrasound and marked his forearm using a sharpie.  We will try to begin cannulation later this week or next week.  We discussed that if successful cannulation occurs both needles for 1 week and we can move forward with getting his catheter out.  The patient should have a duplex ultrasound of the dialysis access in 6 months.  The patient will follow-up with me in the office after each ultrasound   - VAS [place] (AVF, AVG); Future  2. End-stage renal disease ([place]) At the present time the patient has adequate dialysis access.  Continue hemodialysis as ordered without interruption.  Avoid nephrotoxic medications and dehydration.  Further plans per nephrology  3. Chronic venous insufficiency  No surgery or intervention at this point in time.    I have reviewed my discussion with the patient regarding lymphedema and why it  causes symptoms.  Patient will continue wearing graduated compression stockings class 1 (20-30 mmHg) on a daily basis a prescription was given. The patient is reminded to put the stockings on first thing in the morning and removing them in the evening. The patient is instructed specifically not to sleep in the stockings.   In addition, behavioral modification throughout the day will be continued.  This will include frequent elevation (such as in a recliner), use of over the counter pain medications  as needed and exercise such as walking.  I have reviewed systemic causes for chronic edema such as liver, kidney and cardiac etiologies and there does not appear to be any significant changes in these organ systems over the past year.  The patient is under the impression that these organ systems are all stable and unchanged.    The patient will continue aggressive use of the  lymph pump.  This will continue to improve the edema control and prevent sequela such as ulcers and infections.   The patient will  follow-up with me on an annual basis.    4. Essential hypertension Continue antihypertensive medications as already ordered, these medications have been reviewed and there are no changes at this time.   5. Type 1 diabetes mellitus with other circulatory complication (HCC) Continue hypoglycemic medications as already ordered, these medications have been reviewed and there are no changes at this time.  Hgb A1C to be monitored as already arranged by primary service     [name], MD  11/08/2019 3:26 PM

## 2019-11-14 ENCOUNTER — Telehealth (INDEPENDENT_AMBULATORY_CARE_PROVIDER_SITE_OTHER): Payer: Self-pay

## 2019-11-14 NOTE — Telephone Encounter (Signed)
A fax was received from [facility] at [facility] for the patient to have a permcath removal. Patient is scheduled with Dr. Delana Meyer for a permcath removal on 11/20/19 with a 11:00 am arrival time to the [facility]. Patient will do covid testing on 11/16/19 before 3:00 pm. Unsure of the time the patient goes to dialysis, so if he goes early morning he can do afternoon before 3:00 pm if he goes afternoon then he will go before 11:00 am. Pre-procedure instructions will be faxed back to [facility].

## 2019-11-16 ENCOUNTER — Other Ambulatory Visit: Admission: RE | Admit: 2019-11-16 | Payer: Medicare Other | Source: Ambulatory Visit

## 2019-11-20 ENCOUNTER — Ambulatory Visit: Admit: 2019-11-20 | Payer: Medicare Other | Admitting: Vascular Surgery

## 2019-11-20 SURGERY — DIALYSIS/PERMA CATHETER REMOVAL
Anesthesia: LOCAL

## 2019-12-05 ENCOUNTER — Other Ambulatory Visit (INDEPENDENT_AMBULATORY_CARE_PROVIDER_SITE_OTHER): Payer: Self-pay | Admitting: Vascular Surgery

## 2019-12-05 DIAGNOSIS — N186 End stage renal disease: Secondary | ICD-10-CM

## 2019-12-06 ENCOUNTER — Ambulatory Visit (INDEPENDENT_AMBULATORY_CARE_PROVIDER_SITE_OTHER): Payer: Medicare Other | Admitting: Vascular Surgery

## 2019-12-06 ENCOUNTER — Encounter (INDEPENDENT_AMBULATORY_CARE_PROVIDER_SITE_OTHER): Payer: Self-pay | Admitting: Vascular Surgery

## 2019-12-06 ENCOUNTER — Other Ambulatory Visit (INDEPENDENT_AMBULATORY_CARE_PROVIDER_SITE_OTHER): Payer: Self-pay | Admitting: Vascular Surgery

## 2019-12-06 ENCOUNTER — Other Ambulatory Visit: Payer: Self-pay

## 2019-12-06 ENCOUNTER — Encounter (INDEPENDENT_AMBULATORY_CARE_PROVIDER_SITE_OTHER): Payer: Self-pay

## 2019-12-06 ENCOUNTER — Ambulatory Visit (INDEPENDENT_AMBULATORY_CARE_PROVIDER_SITE_OTHER): Payer: Medicare Other

## 2019-12-06 VITALS — BP 154/80 | HR 62 | Resp 16 | Wt 392.8 lb

## 2019-12-06 DIAGNOSIS — T829XXS Unspecified complication of cardiac and vascular prosthetic device, implant and graft, sequela: Secondary | ICD-10-CM

## 2019-12-06 DIAGNOSIS — I10 Essential (primary) hypertension: Secondary | ICD-10-CM

## 2019-12-06 DIAGNOSIS — E1059 Type 1 diabetes mellitus with other circulatory complications: Secondary | ICD-10-CM

## 2019-12-06 DIAGNOSIS — Z992 Dependence on renal dialysis: Secondary | ICD-10-CM

## 2019-12-06 DIAGNOSIS — N186 End stage renal disease: Secondary | ICD-10-CM

## 2019-12-06 DIAGNOSIS — I872 Venous insufficiency (chronic) (peripheral): Secondary | ICD-10-CM

## 2019-12-06 NOTE — Progress Notes (Signed)
MRN : [id number]  [name] is a 49 y.o. (Apr 14, 1971) male who presents with chief complaint of  Chief Complaint  Patient presents with  . Follow-up  .  History of Present Illness:   The patient returns to the office for follow up regarding problem with the dialysis access. Currently the patient is maintained via a left wrist fistula  The patient has had multiple failed upper extremity accesses.  Recently he sustained a hematoma of the left forearm and they are having difficulty access the fistula  The patient denies hand pain or other symptoms consistent with steal phenomena.  No significant arm swelling.  The patient denies redness or swelling at the access site. The patient denies fever or chills at home or while on dialysis.  The patient denies amaurosis fugax or recent TIA symptoms. There are no recent neurological changes noted. The patient denies claudication symptoms or rest pain symptoms. The patient denies history of DVT, PE or superficial thrombophlebitis. The patient denies recent episodes of angina or shortness of breath.      Current Meds  Medication Sig  . acetaminophen (TYLENOL) 500 MG tablet Take 500 mg by mouth every 6 (six) hours as needed for moderate pain or headache.  [name] amLODipine (NORVASC) 10 MG tablet Take 5 mg by mouth daily.   [name] apixaban (ELIQUIS) 5 MG TABS tablet Take 5 mg by mouth 2 (two) times daily.   . Ascorbic Acid (VITAMIN C WITH ROSE HIPS) 1000 MG tablet Take 1,000 mg by mouth 2 (two) times a week.   [name] atenolol (TENORMIN) 50 MG tablet Take 25 mg by mouth once a week.  . B Complex-C-Folic Acid (RENA-VITE RX) 1 MG TABS Take 1 tablet by mouth daily.  . calcium acetate (PHOSLO) 667 MG capsule Take 667-1,334 mg by mouth See admin instructions. Take 2 capsules (1334 mg) by mouth with each meal & take 1 capsule (667 mg) by mouth with each snack.  . cinacalcet (SENSIPAR) 30 MG tablet Take 30 mg by mouth daily.  [name] CINNAMON PO Take 1,000 mg by mouth 2  (two) times daily.  [name] ibuprofen (ADVIL) 800 MG tablet Take 800 mg by mouth every 8 (eight) hours as needed for moderate pain.   . Insulin Glargine (LANTUS SOLOSTAR) 100 UNIT/ML Solostar Pen Inject 14 Units into the skin 2 (two) times daily.   [name] loratadine (CLARITIN) 10 MG tablet Take 10 mg by mouth daily as needed for allergies.  . Melatonin 10 MG CAPS Take 10 mg by mouth at bedtime.   . metolazone (ZAROXOLYN) 5 MG tablet Take 5 mg by mouth every Monday, Wednesday, and Friday.   . Multiple Vitamin (MULTIVITAMIN WITH MINERALS) TABS tablet Take 1 tablet by mouth every Tuesday, Thursday, and Saturday at 6 PM. Men's Multivitamin  . Multiple Vitamins-Minerals (EYE HEALTH PO) Take 1 capsule by mouth daily.   . Omega-3 1000 MG CAPS Take 1,000 mg by mouth daily.    Past Medical History:  Diagnosis Date  . Anxiety   . Chronic kidney disease    14% FUNCTION  . Depression   . Diabetes mellitus without complication ([name])   . Dyspnea    DOE  . Dysrhythmia   . History of orthopnea   . Hypertension   . Lymphedema of leg   . Microalbuminuria   . Sleep apnea    no CPAP  . Vitamin D deficiency     Past Surgical History:  Procedure Laterality Date  . A/V  FISTULAGRAM Right 10/24/2018   Procedure: A/V FISTULAGRAM;  Surgeon: Katha Cabal, MD;  Location: [facility];  Service: Cardiovascular;  Laterality: Right;  . A/V FISTULAGRAM Right 11/24/2018   Procedure: A/V FISTULAGRAM;  Surgeon: Katha Cabal, MD;  Location: [facility];  Service: Cardiovascular;  Laterality: Right;  . A/V FISTULAGRAM Left 07/31/2019   Procedure: A/V FISTULAGRAM;  Surgeon: Katha Cabal, MD;  Location: [facility];  Service: Cardiovascular;  Laterality: Left;  . A/V SHUNT INTERVENTION Right 02/21/2019   Procedure: A/V SHUNT INTERVENTION;  Surgeon: Katha Cabal, MD;  Location: [facility];  Service: Cardiovascular;  Laterality: Right;  . APPLICATION OF WOUND VAC Left  10/03/2017   Procedure: APPLICATION OF WOUND VAC;  Surgeon: Algernon Huxley, MD;  Location: [facility];  Service: General;  Laterality: Left;  . APPLICATION OF WOUND VAC Left 10/11/2017   Procedure: APPLICATION OF WOUND VAC;  Surgeon: Katha Cabal, MD;  Location: [facility];  Service: Vascular;  Laterality: Left;  . APPLICATION OF WOUND VAC Left 10/14/2017   Procedure: WOUND VAC CHANGE;  Surgeon: Katha Cabal, MD;  Location: [facility];  Service: Vascular;  Laterality: Left;  left lower leg  . APPLICATION OF WOUND VAC Left 10/18/2017   Procedure: WOUND VAC CHANGE;  Surgeon: Katha Cabal, MD;  Location: [facility];  Service: Vascular;  Laterality: Left;  . APPLICATION OF WOUND VAC Left 10/07/2017   Procedure: APPLICATION OF WOUND VAC;  Surgeon: Katha Cabal, MD;  Location: [facility];  Service: Vascular;  Laterality: Left;  . AV FISTULA INSERTION W/ RF MAGNETIC GUIDANCE Right 07/19/2018   Procedure: AV FISTULA INSERTION W/RF MAGNETIC GUIDANCE;  Surgeon: Katha Cabal, MD;  Location: [facility];  Service: Cardiovascular;  Laterality: Right;  . AV FISTULA PLACEMENT Left 05/11/2019   Procedure: ARTERIOVENOUS (AV) FISTULA CREATION (RADIOCEPHALIC );  Surgeon: Katha Cabal, MD;  Location: [facility];  Service: Vascular;  Laterality: Left;  . CATARACT EXTRACTION W/PHACO Left 11/01/2018   Procedure: CATARACT EXTRACTION PHACO AND INTRAOCULAR LENS PLACEMENT (IOC)-LEFT, DIABETIC-INSULIN DEPENDENT;  Surgeon: Eulogio Bear, MD;  Location: [facility];  Service: Ophthalmology;  Laterality: Left;  [place] 00:41.8 CDE 4.61 Fluid Pack Lot # [id number]  . CATARACT EXTRACTION W/PHACO Right 04/27/2019   Procedure: CATARACT EXTRACTION PHACO AND INTRAOCULAR LENS PLACEMENT (IOC);  Surgeon: Eulogio Bear, MD;  Location: [facility];  Service: Ophthalmology;  Laterality: Right;  [place] 00:34 CDE 1.97 FLUID PACK LOT # [id number]   . DIALYSIS/PERMA CATHETER INSERTION N/A 02/23/2019   Procedure: DIALYSIS/PERMA  CATHETER INSERTION;  Surgeon: Algernon Huxley, MD;  Location: [facility];  Service: Cardiovascular;  Laterality: N/A;  . I & D EXTREMITY Left 10/11/2017   Procedure: IRRIGATION AND DEBRIDEMENT EXTREMITY;  Surgeon: Katha Cabal, MD;  Location: [facility];  Service: Vascular;  Laterality: Left;  . INCISION AND DRAINAGE ABSCESS Right 10/07/2017   Procedure: INCISION AND DRAINAGE ABSCESS;  Surgeon: Katha Cabal, MD;  Location: [facility];  Service: Vascular;  Laterality: Right;  . IRRIGATION AND DEBRIDEMENT ABSCESS Left 10/03/2017   Procedure: IRRIGATION AND DEBRIDEMENT ABSCESS with debridement of skin, soft tissue, muscle 50sq cm;  Surgeon: Algernon Huxley, MD;  Location: [facility];  Service: General;  Laterality: Left;  . TEMPORARY DIALYSIS CATHETER  02/20/2019   Procedure: TEMPORARY DIALYSIS CATHETER;  Surgeon: Katha Cabal, MD;  Location: [facility];  Service: Cardiovascular;;  . UPPER EXTREMITY  ANGIOGRAPHY Left 09/18/2019   Procedure: UPPER EXTREMITY ANGIOGRAPHY;  Surgeon: Katha Cabal, MD;  Location: [facility];  Service: Cardiovascular;  Laterality: Left;    Social History Social History   Tobacco Use  . Smoking status: Former Smoker    Types: Cigars  . Smokeless tobacco: Former Systems developer    Quit date: 09/30/2017  . Tobacco comment: occasional  Substance Use Topics  . Alcohol use: Yes    Alcohol/week: 1.0 standard drinks    Types: 1 Cans of beer per week    Comment: beer  . Drug use: Not Currently    Family History Family History  Problem Relation Age of Onset  . Diabetes Father   . Kidney disease Father   . Diabetes Daughter     Allergies  Allergen Reactions  . Lisinopril Swelling  . Adhesive [Tape] Itching  . Furosemide Cough    Light headed, blurry vision, weakness.     REVIEW OF SYSTEMS (Negative unless checked)  Constitutional: [] Weight loss  [] Fever  [] Chills Cardiac: [] Chest pain   [] Chest pressure   [] Palpitations   [] Shortness  of breath when laying flat   [] Shortness of breath with exertion. Vascular:  [] Pain in legs with walking   [] Pain in legs at rest  [] History of DVT   [] Phlebitis   [] Swelling in legs   [] Varicose veins   [] Non-healing ulcers Pulmonary:   [] Uses home oxygen   [] Productive cough   [] Hemoptysis   [] Wheeze  [] COPD   [] Asthma Neurologic:  [] Dizziness   [] Seizures   [] History of stroke   [] History of TIA  [] Aphasia   [] Vissual changes   [] Weakness or numbness in arm   [] Weakness or numbness in leg Musculoskeletal:   [] Joint swelling   [] Joint pain   [] Low back pain Hematologic:  [] Easy bruising  [] Easy bleeding   [] Hypercoagulable state   [] Anemic Gastrointestinal:  [] Diarrhea   [] Vomiting  [] Gastroesophageal reflux/heartburn   [] Difficulty swallowing. Genitourinary:  [x] Chronic kidney disease   [] Difficult urination  [] Frequent urination   [] Blood in urine Skin:  [] Rashes   [] Ulcers  Psychological:  [] History of anxiety   []  History of major depression.  Physical Examination  Vitals:   12/06/19 1411  BP: (!) 154/80  Pulse: 62  Resp: 16  Weight: (!) 392 lb 12.8 oz (178.2 kg)   Body mass index is 47.81 kg/m. Gen: WD/WN, NAD Head: [place]/AT, No temporalis wasting.  Ear/Nose/Throat: Hearing grossly intact, nares w/o erythema or drainage Eyes: PER, EOMI, sclera nonicteric.  Neck: Supple, no large masses.   Pulmonary:  Good air movement, no audible wheezing bilaterally, no use of accessory muscles.  Cardiac: RRR, no JVD Vascular:  Resolving ecchymosis of the left forearm. Weak thrill and weak bruit Vessel Right Left  Radial Palpable Palpable  Gastrointestinal: Non-distended. No guarding/no peritoneal signs.  Musculoskeletal: M/S 5/5 throughout.  No deformity or atrophy.  Neurologic: CN 2-12 intact. Symmetrical.  Speech is fluent. Motor exam as listed above. Psychiatric: Judgment intact, Mood & affect appropriate for pt's clinical situation. Dermatologic: No rashes or ulcers noted.  No changes  consistent with cellulitis.  CBC Lab Results  Component Value Date   WBC 5.7 05/08/2019   HGB 14.3 05/11/2019   HCT 42.0 05/11/2019   MCV 96.6 05/08/2019   PLT 199 05/08/2019    BMET    Component Value Date/Time   NA 136 05/11/2019 0617   K 4.0 05/11/2019 0617   CL 98 05/08/2019 1333   CO2 23 05/08/2019  1333   GLUCOSE 216 (H) 05/11/2019 0617   BUN 56 (H) 05/08/2019 1333   CREATININE 12.50 (H) 05/08/2019 1333   CALCIUM 8.6 (L) 05/08/2019 1333   GFRNONAA 4 (L) 05/08/2019 1333   GFRAA 5 (L) 05/08/2019 1333   CrCl cannot be calculated (Patient's most recent lab result is older than the maximum 21 days allowed.).  COAG Lab Results  Component Value Date   INR 1.0 05/08/2019   INR 1.0 02/22/2019   INR 1.09 07/14/2018    Radiology No results found.   Assessment/Plan 1. Complication of vascular access for dialysis, sequela Recommend:  The patient is experiencing increasing problems with their dialysis access.  Duplex ultrasound shows a stricture in the distal forearm and the flow volume is no 788.  Patient should have a fistulagram with the intention for intervention.  The intention for intervention is to restore appropriate flow and prevent thrombosis and possible loss of the access.  As well as improve the quality of dialysis therapy.  The risks, benefits and alternative therapies were reviewed in detail with the patient.  All questions were answered.  The patient agrees to proceed with angio/intervention.      2. End-stage renal disease ([place]) At the present time the patient has marginal dialysis access.  We will set up a fistulagram.  Continue hemodialysis as ordered without interruption.  Avoid nephrotoxic medications and dehydration.  Further plans per nephrology  3. Type 1 diabetes mellitus with other circulatory complication (HCC) Continue hypoglycemic medications as already ordered, these medications have been reviewed and there are no changes at this  time.  Hgb A1C to be monitored as already arranged by primary service   4. Chronic venous insufficiency  No surgery or intervention at this point in time.    I have reviewed my discussion with the patient regarding lymphedema and why it  causes symptoms.  Patient will continue wearing graduated compression stockings class 1 (20-30 mmHg) on a daily basis a prescription was given. The patient is reminded to put the stockings on first thing in the morning and removing them in the evening. The patient is instructed specifically not to sleep in the stockings.   In addition, behavioral modification throughout the day will be continued.  This will include frequent elevation (such as in a recliner), use of over the counter pain medications as needed and exercise such as walking.  I have reviewed systemic causes for chronic edema such as liver, kidney and cardiac etiologies and there does not appear to be any significant changes in these organ systems over the past year.  The patient is under the impression that these organ systems are all stable and unchanged.    The patient will continue aggressive use of the  lymph pump.  This will continue to improve the edema control and prevent sequela such as ulcers and infections.   The patient will follow-up with me on an annual basis.    5. Essential hypertension Continue antihypertensive medications as already ordered, these medications have been reviewed and there are no changes at this time.     [name], MD  12/06/2019 2:33 PM

## 2019-12-07 ENCOUNTER — Other Ambulatory Visit
Admission: RE | Admit: 2019-12-07 | Discharge: 2019-12-07 | Disposition: A | Discharge status: Home / Self Care | Payer: Medicare Other | Source: Ambulatory Visit | Attending: Vascular Surgery | Admitting: Vascular Surgery

## 2019-12-07 DIAGNOSIS — Z20822 Contact with and (suspected) exposure to covid-19: Secondary | ICD-10-CM | POA: Diagnosis not present

## 2019-12-07 DIAGNOSIS — Z01812 Encounter for preprocedural laboratory examination: Secondary | ICD-10-CM | POA: Insufficient documentation

## 2019-12-08 LAB — SARS CORONAVIRUS 2 (TAT 6-24 HRS): SARS Coronavirus 2: NEGATIVE

## 2019-12-11 ENCOUNTER — Encounter
Admission: RE | Disposition: A | Discharge status: Home / Self Care | Payer: Self-pay | Source: Home / Self Care | Attending: Vascular Surgery

## 2019-12-11 ENCOUNTER — Encounter: Payer: Self-pay | Admitting: Vascular Surgery

## 2019-12-11 ENCOUNTER — Other Ambulatory Visit (INDEPENDENT_AMBULATORY_CARE_PROVIDER_SITE_OTHER): Payer: Self-pay | Admitting: Nurse Practitioner

## 2019-12-11 ENCOUNTER — Ambulatory Visit
Admission: RE | Admit: 2019-12-11 | Discharge: 2019-12-11 | Disposition: A | Discharge status: Home / Self Care | Payer: Medicare Other | Attending: Vascular Surgery | Admitting: Vascular Surgery

## 2019-12-11 ENCOUNTER — Other Ambulatory Visit: Payer: Self-pay

## 2019-12-11 DIAGNOSIS — E1051 Type 1 diabetes mellitus with diabetic peripheral angiopathy without gangrene: Secondary | ICD-10-CM | POA: Diagnosis not present

## 2019-12-11 DIAGNOSIS — T82858S Stenosis of vascular prosthetic devices, implants and grafts, sequela: Secondary | ICD-10-CM | POA: Diagnosis present

## 2019-12-11 DIAGNOSIS — I890 Lymphedema, not elsewhere classified: Secondary | ICD-10-CM | POA: Diagnosis not present

## 2019-12-11 DIAGNOSIS — Z87891 Personal history of nicotine dependence: Secondary | ICD-10-CM | POA: Diagnosis not present

## 2019-12-11 DIAGNOSIS — N186 End stage renal disease: Secondary | ICD-10-CM | POA: Diagnosis not present

## 2019-12-11 DIAGNOSIS — E559 Vitamin D deficiency, unspecified: Secondary | ICD-10-CM | POA: Diagnosis not present

## 2019-12-11 DIAGNOSIS — I120 Hypertensive chronic kidney disease with stage 5 chronic kidney disease or end stage renal disease: Secondary | ICD-10-CM | POA: Diagnosis not present

## 2019-12-11 DIAGNOSIS — Y841 Kidney dialysis as the cause of abnormal reaction of the patient, or of later complication, without mention of misadventure at the time of the procedure: Secondary | ICD-10-CM | POA: Diagnosis not present

## 2019-12-11 DIAGNOSIS — F329 Major depressive disorder, single episode, unspecified: Secondary | ICD-10-CM | POA: Diagnosis not present

## 2019-12-11 DIAGNOSIS — Z992 Dependence on renal dialysis: Secondary | ICD-10-CM | POA: Diagnosis not present

## 2019-12-11 DIAGNOSIS — Z7901 Long term (current) use of anticoagulants: Secondary | ICD-10-CM | POA: Diagnosis not present

## 2019-12-11 DIAGNOSIS — G4730 Sleep apnea, unspecified: Secondary | ICD-10-CM | POA: Insufficient documentation

## 2019-12-11 DIAGNOSIS — F419 Anxiety disorder, unspecified: Secondary | ICD-10-CM | POA: Diagnosis not present

## 2019-12-11 DIAGNOSIS — E1022 Type 1 diabetes mellitus with diabetic chronic kidney disease: Secondary | ICD-10-CM | POA: Insufficient documentation

## 2019-12-11 DIAGNOSIS — T82898A Other specified complication of vascular prosthetic devices, implants and grafts, initial encounter: Secondary | ICD-10-CM

## 2019-12-11 DIAGNOSIS — Z794 Long term (current) use of insulin: Secondary | ICD-10-CM | POA: Diagnosis not present

## 2019-12-11 DIAGNOSIS — Z79899 Other long term (current) drug therapy: Secondary | ICD-10-CM | POA: Insufficient documentation

## 2019-12-11 HISTORY — PX: A/V FISTULAGRAM: CATH118298

## 2019-12-11 LAB — GLUCOSE, CAPILLARY
Glucose-Capillary: 141 mg/dL — ABNORMAL HIGH (ref 70–99)
Glucose-Capillary: 116 mg/dL — ABNORMAL HIGH (ref 70–99)

## 2019-12-11 LAB — POTASSIUM (ARMC VASCULAR LAB ONLY): Potassium (ARMC vascular lab): 5.1 (ref 3.5–5.1)

## 2019-12-11 SURGERY — A/V FISTULAGRAM
Anesthesia: Moderate Sedation | Laterality: Left

## 2019-12-11 MED ORDER — SODIUM CHLORIDE 0.9 % IV SOLN: INTRAVENOUS | Status: DC

## 2019-12-11 MED ORDER — CEFAZOLIN SODIUM-DEXTROSE 1-4 GM/50ML-% IV SOLN
1.0000 g | Freq: Once | INTRAVENOUS | Status: AC
  Administered 2019-12-11: 1 g via INTRAVENOUS

## 2019-12-11 MED ORDER — HEPARIN SODIUM (PORCINE) 1000 UNIT/ML IJ SOLN
INTRAMUSCULAR | Status: DC | PRN
  Administered 2019-12-11: 4000 [IU] via INTRAVENOUS

## 2019-12-11 MED ORDER — CEFAZOLIN SODIUM-DEXTROSE 2-4 GM/100ML-% IV SOLN: 2.0000 g | Freq: Once | INTRAVENOUS | Status: DC

## 2019-12-11 MED ORDER — MIDAZOLAM HCL 2 MG/ML PO SYRP: 8.0000 mg | ORAL_SOLUTION | Freq: Once | ORAL | Status: DC | PRN

## 2019-12-11 MED ORDER — FENTANYL CITRATE (PF) 100 MCG/2ML IJ SOLN
INTRAMUSCULAR | Status: AC
  Filled 2019-12-11: qty 2

## 2019-12-11 MED ORDER — MIDAZOLAM HCL 5 MG/5ML IJ SOLN
INTRAMUSCULAR | Status: AC
  Filled 2019-12-11: qty 5

## 2019-12-11 MED ORDER — IODIXANOL 320 MG/ML IV SOLN
INTRAVENOUS | Status: DC | PRN
  Administered 2019-12-11: 45 mL via INTRAVENOUS

## 2019-12-11 MED ORDER — ONDANSETRON HCL 4 MG/2ML IJ SOLN: 4.0000 mg | Freq: Four times a day (QID) | INTRAMUSCULAR | Status: DC | PRN

## 2019-12-11 MED ORDER — HYDROMORPHONE HCL 1 MG/ML IJ SOLN: 1.0000 mg | Freq: Once | INTRAMUSCULAR | Status: DC | PRN

## 2019-12-11 MED ORDER — HEPARIN SODIUM (PORCINE) 1000 UNIT/ML IJ SOLN
INTRAMUSCULAR | Status: AC
  Filled 2019-12-11: qty 1

## 2019-12-11 MED ORDER — FENTANYL CITRATE (PF) 100 MCG/2ML IJ SOLN
INTRAMUSCULAR | Status: DC | PRN
  Administered 2019-12-11 (×3): 25 ug via INTRAVENOUS
  Administered 2019-12-11: 100 ug via INTRAVENOUS
  Administered 2019-12-11 (×2): 50 ug via INTRAVENOUS

## 2019-12-11 MED ORDER — MIDAZOLAM HCL 2 MG/2ML IJ SOLN
INTRAMUSCULAR | Status: AC
  Filled 2019-12-11: qty 2

## 2019-12-11 MED ORDER — ASPIRIN 81 MG PO CHEW
162.0000 mg | CHEWABLE_TABLET | Freq: Once | ORAL | Status: AC
  Administered 2019-12-11: 162 mg via ORAL

## 2019-12-11 MED ORDER — ASPIRIN 81 MG PO CHEW
CHEWABLE_TABLET | ORAL | Status: AC
  Filled 2019-12-11: qty 2

## 2019-12-11 MED ORDER — DIPHENHYDRAMINE HCL 50 MG/ML IJ SOLN: 50.0000 mg | Freq: Once | INTRAMUSCULAR | Status: DC | PRN

## 2019-12-11 MED ORDER — MIDAZOLAM HCL 2 MG/2ML IJ SOLN
INTRAMUSCULAR | Status: DC | PRN
  Administered 2019-12-11 (×4): 1 mg via INTRAVENOUS
  Administered 2019-12-11: 2 mg via INTRAVENOUS

## 2019-12-11 MED ORDER — METHYLPREDNISOLONE SODIUM SUCC 125 MG IJ SOLR: 125.0000 mg | Freq: Once | INTRAMUSCULAR | Status: DC | PRN

## 2019-12-11 MED ORDER — CEFAZOLIN SODIUM-DEXTROSE 1-4 GM/50ML-% IV SOLN
INTRAVENOUS | Status: AC
  Filled 2019-12-11: qty 50

## 2019-12-11 MED ORDER — FAMOTIDINE 20 MG PO TABS: 40.0000 mg | ORAL_TABLET | Freq: Once | ORAL | Status: DC | PRN

## 2019-12-11 SURGICAL SUPPLY — 37 items
BALLN DORADO 6X40X80 (BALLOONS) ×3
BALLN DORADO 7X40X80 (BALLOONS) ×3
BALLN LUTONIX 018 5X80X130 (BALLOONS) ×3
BALLN LUTONIX 7X80X130 (BALLOONS) ×3
BALLN LUTONIX DCB 5X40X130 (BALLOONS) ×3
BALLN TREK RX 3.0X20 (BALLOONS) ×3
BALLN ~~LOC~~ EUPHORA RX 4.0X20 (BALLOONS) ×3
BALLOON DORADO 6X40X80 (BALLOONS) IMPLANT
BALLOON DORADO 7X40X80 (BALLOONS) IMPLANT
BALLOON LUTONIX 018 5X80X130 (BALLOONS) IMPLANT
BALLOON LUTONIX 7X80X130 (BALLOONS) IMPLANT
BALLOON LUTONIX DCB 5X40X130 (BALLOONS) IMPLANT
BALLOON TREK RX 3.0X20 (BALLOONS) IMPLANT
BALLOON ~~LOC~~ EUPHORA RX 4.0X20 (BALLOONS) IMPLANT
CATH BEACON 5 .035 40 KMP TP (CATHETERS) IMPLANT
CATH BEACON 5 .038 40 KMP TP (CATHETERS) ×2
DEVICE PRESTO INFLATION (MISCELLANEOUS) ×2 IMPLANT
DEVICE TORQUE .025-.038 (MISCELLANEOUS) ×2 IMPLANT
DRAPE BRACHIAL (DRAPES) ×2 IMPLANT
GUIDEWIRE ANGLED .035 180CM (WIRE) ×2 IMPLANT
GUIDEWIRE STR TIP .014X300X8 (WIRE) ×2 IMPLANT
NDL ENTRY 21GA 7CM ECHOTIP (NEEDLE) IMPLANT
NEEDLE ENTRY 21GA 7CM ECHOTIP (NEEDLE) ×3 IMPLANT
PACK ANGIOGRAPHY (CUSTOM PROCEDURE TRAY) ×3 IMPLANT
SET INTRO CAPELLA COAXIAL (SET/KITS/TRAYS/PACK) ×4 IMPLANT
SHEATH BRITE TIP 6FRX5.5 (SHEATH) ×2 IMPLANT
SHEATH BRITE TIP 7FRX5.5 (SHEATH) ×2 IMPLANT
STENT VIABAHN 7X50X120 (Permanent Stent) ×2 IMPLANT
STENT VIABAHN 7X5X120 7FR (Permanent Stent) IMPLANT
SUT MNCRL 4-0 (SUTURE) ×2
SUT MNCRL 4-0 27XMFL (SUTURE) ×1
SUTURE MNCRL 4-0 27XMF (SUTURE) IMPLANT
TOWEL OR 17X26 4PK STRL BLUE (TOWEL DISPOSABLE) ×2 IMPLANT
VALVE HEMO TOUHY BORST Y (ADAPTER) ×2 IMPLANT
WIRE G 018X200 V18 (WIRE) ×2 IMPLANT
WIRE MAGIC TOR.035 180C (WIRE) ×2 IMPLANT
WIRE NITINOL .018 (WIRE) ×2 IMPLANT

## 2019-12-11 NOTE — Discharge Instructions (Signed)

## 2019-12-11 NOTE — OR Nursing (Signed)
Pt advised to take one baby aspirin daily along with his Eliquis by Dr Delana Meyer. Two 81 mg aspirin given to pt.

## 2019-12-11 NOTE — OR Nursing (Signed)
Left message with [facility] answerring machine for follow up apt time. Requested they call him with follow up apt time. On his cell phone

## 2019-12-11 NOTE — Op Note (Signed)
OPERATIVE NOTE   PROCEDURE: 1. Contrast injection left wrist AV access 2. Percutaneous transluminal angioplasty and stent placement left radiocephalic fistula  PRE-OPERATIVE DIAGNOSIS: Complication of dialysis access                                                       End Stage Renal Disease  POST-OPERATIVE DIAGNOSIS: same as above   SURGEON: Katha Cabal, M.D.  ANESTHESIA: Conscious sedation was administered under my direct supervision by the interventional radiology RN. IV Versed plus fentanyl were utilized. Continuous ECG, pulse oximetry and blood pressure was monitored throughout the entire procedure.  Conscious sedation was for a total of 80 minutes.  ESTIMATED BLOOD LOSS: minimal  FINDING(S): Stricture of the AV graft  SPECIMEN(S):  None  CONTRAST: 45 cc  FLUOROSCOPY TIME: 9.0 minutes  INDICATIONS: [name] is a 49 y.o. male who  presents with malfunctioning left wrist AV access.  The patient is scheduled for angiography with possible intervention of the AV access.  The patient is aware the risks include but are not limited to: bleeding, infection, thrombosis of the cannulated access, and possible anaphylactic reaction to the contrast.  The patient acknowledges if the access can not be salvaged a tunneled catheter will be needed and will be placed during this procedure.  The patient is aware of the risks of the procedure and elects to proceed with the angiogram and intervention.  DESCRIPTION: After full informed written consent was obtained, the patient was brought back to the Special Procedure suite and placed supine position.  Appropriate cardiopulmonary monitors were placed.  The left wrist and forearm was prepped and draped in the standard fashion.  Appropriate timeout is called. The ultrasound is then utilized to map the fistula proper.  2 large tributaries are then identified near the antecubital fossa and one is selected as the access site.  This tributary was  cannulated with a micropuncture needle in a retrograde direction.  Cannulation was performed with ultrasound guidance. Ultrasound was placed in a sterile sleeve, the AV access was interrogated and noted to be echolucent and compressible indicating patency. Image was recorded for the permanent record. The puncture is performed under continuous ultrasound visualization.   The microwire was advanced and the needle was exchanged for  a microsheath.  The J-wire was then advanced and a 6 Fr sheath inserted.  Floppy Glidewire and a Kumpe catheter were then advanced retrograde toward the wrist and then negotiated into the more proximal radial artery.  Hand injections were completed to image the access from the arterial anastomosis through the entire access.  This showed greater than 80% stenosis immediately proximal to the anastomosis.  The anastomosis in the distal radial artery appeared widely patent.  On initial injection it appears that there is retrograde flow through the distal radial artery up into the fistula essentially the ulnar artery is the primary source because of the high-grade stricture more proximal to the anastomosis which is limiting forward flow.  Also identified is a greater than 80% stenosis about 3 to 4 cm into the fistula and again multiple tributaries are identified which appear to be siphoning blood from the fistula proper.  Based on the images, 4000 units of heparin was given and a wire was negotiated through the strictures of the AV fistula and out into the  radial artery and then advanced so that the tip of the wire was at the level of the antecubital fossa.  Initially the actual anastomosis was treated with 2 coronary balloons a 3 mm x 20 inflated to 14 atm and then a 4 mm x 20 mm coronary balloon inflated to 16 atm.  Both inflations were for 1 minute.  Follow-up imaging demonstrated a more appropriate appearance to the anastomosis with less than 10% residual stenosis.  Attention was then  turned to the stricture within the cephalic vein proper several centimeters above the anastomosis.  A 5 mm x 80 mm Lutonix balloon was used to treat the cephalic vein essentially from the anastomosis proximally..  Inflation was to 14 atm for 1 full minute.  The stricture did not yield with greater than 50% residual stenosis and I used a 7 mm balloon.  Unfortunately this did not treat the stricture adequately and I elected to place a 7 mm x 50 mm Viabahn balloon beginning the stent just a few millimeters above the anastomosis and extending it proximally along the forearm.  This was then postdilated first with a 7 mm Dorado and then a 6 mm Dorado.  Lastly the actual anastomosis was retreated with a 5 mm balloon.  Follow-up imaging demonstrates complete resolution of the stricture with rapid flow of contrast through the fistula.  A 4-0 Monocryl purse-string suture was sewn around the sheath.  The sheath was removed and light pressure was applied.  A sterile bandage was applied to the puncture site.    COMPLICATIONS: None  CONDITION: [name], M.D [facility] Office: [phone]  12/11/2019 1:06 PM

## 2019-12-25 ENCOUNTER — Other Ambulatory Visit (INDEPENDENT_AMBULATORY_CARE_PROVIDER_SITE_OTHER): Payer: Self-pay | Admitting: Vascular Surgery

## 2019-12-25 DIAGNOSIS — T829XXS Unspecified complication of cardiac and vascular prosthetic device, implant and graft, sequela: Secondary | ICD-10-CM

## 2019-12-25 DIAGNOSIS — Z95820 Peripheral vascular angioplasty status with implants and grafts: Secondary | ICD-10-CM

## 2019-12-27 ENCOUNTER — Other Ambulatory Visit: Payer: Self-pay

## 2019-12-27 ENCOUNTER — Ambulatory Visit (INDEPENDENT_AMBULATORY_CARE_PROVIDER_SITE_OTHER): Payer: Medicare Other

## 2019-12-27 ENCOUNTER — Encounter (INDEPENDENT_AMBULATORY_CARE_PROVIDER_SITE_OTHER): Payer: Self-pay | Admitting: Vascular Surgery

## 2019-12-27 ENCOUNTER — Ambulatory Visit (INDEPENDENT_AMBULATORY_CARE_PROVIDER_SITE_OTHER): Payer: Medicare Other | Admitting: Vascular Surgery

## 2019-12-27 VITALS — BP 144/90 | HR 73 | Resp 16 | Wt 389.4 lb

## 2019-12-27 DIAGNOSIS — T829XXS Unspecified complication of cardiac and vascular prosthetic device, implant and graft, sequela: Secondary | ICD-10-CM | POA: Diagnosis not present

## 2019-12-27 DIAGNOSIS — Z95820 Peripheral vascular angioplasty status with implants and grafts: Secondary | ICD-10-CM | POA: Diagnosis not present

## 2019-12-27 DIAGNOSIS — E1059 Type 1 diabetes mellitus with other circulatory complications: Secondary | ICD-10-CM

## 2019-12-27 DIAGNOSIS — I872 Venous insufficiency (chronic) (peripheral): Secondary | ICD-10-CM | POA: Diagnosis not present

## 2019-12-27 DIAGNOSIS — N186 End stage renal disease: Secondary | ICD-10-CM | POA: Diagnosis not present

## 2019-12-27 DIAGNOSIS — I10 Essential (primary) hypertension: Secondary | ICD-10-CM | POA: Diagnosis not present

## 2019-12-28 ENCOUNTER — Encounter (INDEPENDENT_AMBULATORY_CARE_PROVIDER_SITE_OTHER): Payer: Self-pay | Admitting: Vascular Surgery

## 2019-12-28 NOTE — Progress Notes (Addendum)
MRN : [id number]  [name] is a 49 y.o. (Sep 12, 1970) male who presents with chief complaint of  Chief Complaint  Patient presents with  . Follow-up    [facility] 2week post fistula  .  History of Present Illness:   The patient returns to the office for followup of their dialysis access. The function of the access has been stable.  The patient denies hand pain or other symptoms consistent with steal phenomena.  No significant arm swelling.  The patient denies redness or swelling at the access site. The patient denies fever or chills at home or while on dialysis.  The patient denies amaurosis fugax or recent TIA symptoms. There are no recent neurological changes noted. The patient denies claudication symptoms or rest pain symptoms. The patient denies recent episodes of angina or shortness of breath.   Duplex ultrasound of the AV access shows a patent access with uniform velocities.  No focal hemodynamically significant stenosis at this time, the stent appears to be widely patent flow volumes are now 1189 markedly improved.    Current Meds  Medication Sig  . allopurinol (ZYLOPRIM) 100 MG tablet Take 100 mg by mouth daily.  [name] amLODipine (NORVASC) 10 MG tablet Take 10 mg by mouth once a week.   [name] apixaban (ELIQUIS) 5 MG TABS tablet Take 5 mg by mouth 2 (two) times daily.   . Ascorbic Acid (VITAMIN C WITH ROSE HIPS) 1000 MG tablet Take 1,000 mg by mouth 2 (two) times a week.   [name] atenolol (TENORMIN) 50 MG tablet Take 50 mg by mouth once a week.   . B Complex-C-Folic Acid (RENA-VITE RX) 1 MG TABS Take 1 tablet by mouth daily.  . calcium acetate (PHOSLO) 667 MG capsule Take 667-2,001 mg by mouth See admin instructions. Take 2001 mg by mouth with each meal & take 1334 mg by mouth with each snack.  . cinacalcet (SENSIPAR) 30 MG tablet Take 30 mg by mouth daily.  [name] CINNAMON PO Take 1,000 mg by mouth daily.   [name] ibuprofen (ADVIL) 800 MG tablet Take 800 mg by mouth every 8 (eight) hours as  needed for moderate pain.   . Insulin Glargine (LANTUS SOLOSTAR) 100 UNIT/ML Solostar Pen Inject 14 Units into the skin daily.   [name] loratadine (CLARITIN) 10 MG tablet Take 10 mg by mouth daily as needed for allergies.  . melatonin 5 MG TABS Take 5 mg by mouth at bedtime.   . metolazone (ZAROXOLYN) 5 MG tablet Take 5 mg by mouth every Monday, Wednesday, and Friday.   . Multiple Vitamin (MULTIVITAMIN WITH MINERALS) TABS tablet Take 1 tablet by mouth every Tuesday, Thursday, and Saturday at 6 PM. Men's Multivitamin  . Multiple Vitamins-Minerals (EYE HEALTH PO) Take 1 capsule by mouth daily.   . Omega-3 1000 MG CAPS Take 1,000 mg by mouth daily.    Past Medical History:  Diagnosis Date  . Anxiety   . Chronic kidney disease    14% FUNCTION  . Depression   . Diabetes mellitus without complication ([place])   . Dyspnea    DOE  . Dysrhythmia   . History of orthopnea   . Hypertension   . Lymphedema of leg   . Microalbuminuria   . Sleep apnea    no CPAP  . Vitamin D deficiency     Past Surgical History:  Procedure Laterality Date  . A/V FISTULAGRAM Right 10/24/2018   Procedure: A/V FISTULAGRAM;  Surgeon: Katha Cabal, MD;  Location:  [place] [facility];  Service: Cardiovascular;  Laterality: Right;  . A/V FISTULAGRAM Right 11/24/2018   Procedure: A/V FISTULAGRAM;  Surgeon: Katha Cabal, MD;  Location: [place] [facility];  Service: Cardiovascular;  Laterality: Right;  . A/V FISTULAGRAM Left 07/31/2019   Procedure: A/V FISTULAGRAM;  Surgeon: Katha Cabal, MD;  Location: [place] [facility];  Service: Cardiovascular;  Laterality: Left;  . A/V FISTULAGRAM Left 12/11/2019   Procedure: A/V FISTULAGRAM;  Surgeon: Katha Cabal, MD;  Location: [place] [facility];  Service: Cardiovascular;  Laterality: Left;  . A/V SHUNT INTERVENTION Right 02/21/2019   Procedure: A/V SHUNT INTERVENTION;  Surgeon: Katha Cabal, MD;  Location: [place] [facility];  Service:  Cardiovascular;  Laterality: Right;  . APPLICATION OF WOUND VAC Left 10/03/2017   Procedure: APPLICATION OF WOUND VAC;  Surgeon: Algernon Huxley, MD;  Location: [facility];  Service: General;  Laterality: Left;  . APPLICATION OF WOUND VAC Left 10/11/2017   Procedure: APPLICATION OF WOUND VAC;  Surgeon: Katha Cabal, MD;  Location: [facility];  Service: Vascular;  Laterality: Left;  . APPLICATION OF WOUND VAC Left 10/14/2017   Procedure: WOUND VAC CHANGE;  Surgeon: Katha Cabal, MD;  Location: [facility];  Service: Vascular;  Laterality: Left;  left lower leg  . APPLICATION OF WOUND VAC Left 10/18/2017   Procedure: WOUND VAC CHANGE;  Surgeon: Katha Cabal, MD;  Location: [facility];  Service: Vascular;  Laterality: Left;  . APPLICATION OF WOUND VAC Left 10/07/2017   Procedure: APPLICATION OF WOUND VAC;  Surgeon: Katha Cabal, MD;  Location: [facility];  Service: Vascular;  Laterality: Left;  . AV FISTULA INSERTION W/ RF MAGNETIC GUIDANCE Right 07/19/2018   Procedure: AV FISTULA INSERTION W/RF MAGNETIC GUIDANCE;  Surgeon: Katha Cabal, MD;  Location: [place] [facility];  Service: Cardiovascular;  Laterality: Right;  . AV FISTULA PLACEMENT Left 05/11/2019   Procedure: ARTERIOVENOUS (AV) FISTULA CREATION (RADIOCEPHALIC );  Surgeon: Katha Cabal, MD;  Location: [facility];  Service: Vascular;  Laterality: Left;  . CATARACT EXTRACTION W/PHACO Left 11/01/2018   Procedure: CATARACT EXTRACTION PHACO AND INTRAOCULAR LENS PLACEMENT (IOC)-LEFT, DIABETIC-INSULIN DEPENDENT;  Surgeon: Eulogio Bear, MD;  Location: [facility];  Service: Ophthalmology;  Laterality: Left;  [place] 00:41.8 CDE 4.61 Fluid Pack Lot # [id number]  . CATARACT EXTRACTION W/PHACO Right 04/27/2019   Procedure: CATARACT EXTRACTION PHACO AND INTRAOCULAR LENS PLACEMENT (IOC);  Surgeon: Eulogio Bear, MD;  Location: [facility];  Service: Ophthalmology;  Laterality: Right;  [place] 00:34 CDE 1.97 FLUID PACK LOT # [id number]   .  DIALYSIS/PERMA CATHETER INSERTION N/A 02/23/2019   Procedure: DIALYSIS/PERMA CATHETER INSERTION;  Surgeon: Algernon Huxley, MD;  Location: [place] [facility];  Service: Cardiovascular;  Laterality: N/A;  . I & D EXTREMITY Left 10/11/2017   Procedure: IRRIGATION AND DEBRIDEMENT EXTREMITY;  Surgeon: Katha Cabal, MD;  Location: [facility];  Service: Vascular;  Laterality: Left;  . INCISION AND DRAINAGE ABSCESS Right 10/07/2017   Procedure: INCISION AND DRAINAGE ABSCESS;  Surgeon: Katha Cabal, MD;  Location: [facility];  Service: Vascular;  Laterality: Right;  . IRRIGATION AND DEBRIDEMENT ABSCESS Left 10/03/2017   Procedure: IRRIGATION AND DEBRIDEMENT ABSCESS with debridement of skin, soft tissue, muscle 50sq cm;  Surgeon: Algernon Huxley, MD;  Location: [facility];  Service: General;  Laterality: Left;  . TEMPORARY DIALYSIS CATHETER  02/20/2019   Procedure: TEMPORARY DIALYSIS CATHETER;  Surgeon: Katha Cabal, MD;  Location: [place] [facility];  Service: Cardiovascular;;  . UPPER EXTREMITY ANGIOGRAPHY Left 09/18/2019   Procedure: UPPER EXTREMITY ANGIOGRAPHY;  Surgeon: Katha Cabal, MD;  Location: [place] [facility];  Service: Cardiovascular;  Laterality: Left;    Social History Social History   Tobacco Use  . Smoking status: Former Smoker    Types: Cigars  . Smokeless tobacco: Former Systems developer    Quit date: 09/30/2017  . Tobacco comment: occasional  Substance Use Topics  . Alcohol use: Yes    Alcohol/week: 1.0 standard drinks    Types: 1 Cans of beer per week    Comment: beer  . Drug use: Not Currently    Family History Family History  Problem Relation Age of Onset  . Diabetes Father   . Kidney disease Father   . Diabetes Daughter     Allergies  Allergen Reactions  . Lisinopril Swelling  . Adhesive [Tape] Itching  . Furosemide Cough    Light headed, blurry vision, weakness.  Claudius Sis Dressing [Lidocaine (Anorectal)]      REVIEW OF SYSTEMS (Negative  unless checked)  Constitutional: [] Weight loss  [] Fever  [] Chills Cardiac: [] Chest pain   [] Chest pressure   [] Palpitations   [] Shortness of breath when laying flat   [] Shortness of breath with exertion. Vascular:  [] Pain in legs with walking   [x] Pain in legs at rest  [] History of DVT   [] Phlebitis   [x] Swelling in legs   [] Varicose veins   [] Non-healing ulcers Pulmonary:   [] Uses home oxygen   [] Productive cough   [] Hemoptysis   [] Wheeze  [] COPD   [] Asthma Neurologic:  [] Dizziness   [] Seizures   [] History of stroke   [] History of TIA  [] Aphasia   [] Vissual changes   [] Weakness or numbness in arm   [] Weakness or numbness in leg Musculoskeletal:   [] Joint swelling   [] Joint pain   [] Low back pain Hematologic:  [] Easy bruising  [] Easy bleeding   [] Hypercoagulable state   [] Anemic Gastrointestinal:  [] Diarrhea   [] Vomiting  [] Gastroesophageal reflux/heartburn   [] Difficulty swallowing. Genitourinary:  [x] Chronic kidney disease   [] Difficult urination  [] Frequent urination   [] Blood in urine Skin:  [x] Rashes   [] Ulcers  Psychological:  [] History of anxiety   []  History of major depression.  Physical Examination  Vitals:   12/27/19 1427  BP: (!) 144/90  Pulse: 73  Resp: 16  Weight: (!) 389 lb 6.4 oz (176.6 kg)   Body mass index is 47.4 kg/m. Gen: WD/WN, NAD Head: [place]/AT, No temporalis wasting.  Ear/Nose/Throat: Hearing grossly intact, nares w/o erythema or drainage Eyes: PER, EOMI, sclera nonicteric.  Neck: Supple, no large masses.   Pulmonary:  Good air movement, no audible wheezing bilaterally, no use of accessory muscles.  Cardiac: RRR, no JVD Vascular: Left radiocephalic fistula good thrill good bruit vein is palpable.  Stent is also palpable. Vessel Right Left  Radial Palpable Palpable  Ulnar Palpable Palpable  Brachial Palpable Palpable  Gastrointestinal: Non-distended. No guarding/no peritoneal signs.  Musculoskeletal: M/S 5/5 throughout.  No deformity or atrophy.    Neurologic: CN 2-12 intact. Symmetrical.  Speech is fluent. Motor exam as listed above. Psychiatric: Judgment intact, Mood & affect appropriate for pt's clinical situation. Dermatologic: Profound venous rashes no ulcers noted.  No changes consistent with cellulitis. Lymph : + lichenification severe skin changes of chronic lymphedema.  CBC Lab Results  Component Value Date   WBC 5.7 05/08/2019   HGB 14.3 05/11/2019   HCT 42.0 05/11/2019  MCV 96.6 05/08/2019   PLT 199 05/08/2019    BMET    Component Value Date/Time   NA 136 05/11/2019 0617   K 4.0 05/11/2019 0617   CL 98 05/08/2019 1333   CO2 23 05/08/2019 1333   GLUCOSE 216 (H) 05/11/2019 0617   BUN 56 (H) 05/08/2019 1333   CREATININE 12.50 (H) 05/08/2019 1333   CALCIUM 8.6 (L) 05/08/2019 1333   GFRNONAA 4 (L) 05/08/2019 1333   GFRAA 5 (L) 05/08/2019 1333   CrCl cannot be calculated (Patient's most recent lab result is older than the maximum 21 days allowed.).  COAG Lab Results  Component Value Date   INR 1.0 05/08/2019   INR 1.0 02/22/2019   INR 1.09 07/14/2018    Radiology PERIPHERAL VASCULAR CATHETERIZATION  Result Date: 12/11/2019 See op note  VAS [place] (AVF, AVG)  Result Date: 12/06/2019 DIALYSIS ACCESS Reason for Exam: Difficulty dialyzing; mark outflow vein. Access Site: Left Upper Extremity. Access Type: Radial-cephalic AVF. Performing Technologist: Blondell Reveal RT, RDMS, RVT  Examination Guidelines: A complete evaluation includes B-mode imaging, spectral Doppler, color Doppler, and power Doppler as needed of all accessible portions of each vessel. Unilateral testing is considered an integral part of a complete examination. Limited examinations for reoccurring indications may be performed as noted.  Findings: +--------------------+----------+-----------------+-------------------------+ AVF                 PSV (cm/s)Flow Vol (mL/min)        Comments           +--------------------+----------+-----------------+-------------------------+ Native artery inflow   108           788       High brachial bifurcation +--------------------+----------+-----------------+-------------------------+ AVF Anastomosis        339                                               +--------------------+----------+-----------------+-------------------------+  +-------------------+----------+-------------+----------+----------------------+ OUTFLOW VEIN       PSV (cm/s)Diameter (cm)Depth (cm)       Describe        +-------------------+----------+-------------+----------+----------------------+ Dist UA (basilic v)    61                                                  +-------------------+----------+-------------+----------+----------------------+ AC Fossa (ceph v)      42        0.52                                      +-------------------+----------+-------------+----------+----------------------+ Prox Forearm           59        0.47                   0.19cm branch      +-------------------+----------+-------------+----------+----------------------+ Mid Forearm            68        0.49                   0.33 & 0.19 cm  adjacent branches    +-------------------+----------+-------------+----------+----------------------+ Dist Forearm          668        0.25               0.20 & 0.28cm adjacent                                                            branches        +-------------------+----------+-------------+----------+----------------------+  Known retrograde flow in the left radial artery distal to anastomosis.  Summary: Patent left radiocephalic AVF with significant velocity increase at the distal outflow vein level without significant internal vessel narrowing or change in diameter visualized. Decrease in Flow Volume when compared to the previous exam on 10/11/19 which was  1006 mL/min. Outflow vein was marked as requested.  *See table(s) above for measurements and observations.  Diagnosing physician: [name] MD Electronically signed by [name] MD on 12/06/2019 at 3:52:34 PM.   --------------------------------------------------------------------------------   Final      Assessment/Plan 1. Complication of vascular access for dialysis, sequela Recommend:  The patient is doing well and currently has adequate dialysis access. The patient's dialysis center is not reporting any access issues. Flow pattern is stable when compared to the prior ultrasound.  The dialysis center can begin cannulation  The patient should have a duplex ultrasound of the dialysis access in 6 months.  The patient will follow-up with me in the office after each ultrasound   I personally mapped and marked his left wrist fistula with duplex ultrasound.   A total of 35 minutes was spent with this patient and greater than 50% was spent in counseling and coordination of care with the patient.  Discussion included the treatment options for vascular disease including indications for surgery and intervention.  Also discussed is the appropriate timing of treatment.  In addition medical therapy was discussed.  - VAS US DUPLEX DIALYSIS ACCESS (AVF, AVG); Future  2. End-stage renal disease ([place]) At the present time the patient has adequate dialysis access.  Continue hemodialysis as ordered without interruption.  Avoid nephrotoxic medications and dehydration.  Further plans per nephrology  3. Chronic venous insufficiency  No surgery or intervention at this point in time.    I have reviewed my discussion with the patient regarding lymphedema and why it  causes symptoms.  Patient will continue wearing graduated compression stockings class 1 (20-30 mmHg) on a daily basis a prescription was given. The patient is reminded to put the stockings on first thing in the morning and removing  them in the evening. The patient is instructed specifically not to sleep in the stockings.   In addition, behavioral modification throughout the day will be continued.  This will include frequent elevation (such as in a recliner), use of over the counter pain medications as needed and exercise such as walking.  I have reviewed systemic causes for chronic edema such as liver, kidney and cardiac etiologies and there does not appear to be any significant changes in these organ systems over the past year.  The patient is under the impression that these organ systems are all stable and unchanged.    The patient will continue aggressive use of the  lymph pump.  This will continue to improve the edema control and prevent  sequela such as ulcers and infections.   The patient will follow-up with me on an annual basis.    4. Essential hypertension Continue antihypertensive medications as already ordered, these medications have been reviewed and there are no changes at this time.   5. Type 1 diabetes mellitus with other circulatory complication (HCC) Continue hypoglycemic medications as already ordered, these medications have been reviewed and there are no changes at this time.  Hgb A1C to be monitored as already arranged by primary service     [name], MD  12/28/2019 5:43 PM

## 2020-02-05 ENCOUNTER — Encounter: Payer: Self-pay | Admitting: Urology

## 2020-02-05 ENCOUNTER — Other Ambulatory Visit: Payer: Self-pay

## 2020-02-05 ENCOUNTER — Ambulatory Visit (INDEPENDENT_AMBULATORY_CARE_PROVIDER_SITE_OTHER): Payer: Medicare Other | Admitting: Urology

## 2020-02-05 VITALS — BP 136/74 | HR 63 | Ht 76.0 in | Wt 389.0 lb

## 2020-02-05 DIAGNOSIS — Z6841 Body Mass Index (BMI) 40.0 and over, adult: Secondary | ICD-10-CM

## 2020-02-05 DIAGNOSIS — N529 Male erectile dysfunction, unspecified: Secondary | ICD-10-CM

## 2020-02-05 MED ORDER — TADALAFIL 10 MG PO TABS
10.0000 mg | ORAL_TABLET | Freq: Every day | ORAL | 11 refills | Status: DC | PRN
Start: 2020-02-05 — End: 2020-03-31

## 2020-02-05 NOTE — Progress Notes (Signed)
02/05/20 10:35 AM   [name] [birth year] [id number]  CC: Erectile dysfunction  HPI: I saw [name] today in urology clinic for evaluation of erectile dysfunction.  He is an extremely comorbid 49 year old male with history notable for morbid obesity and BMI of 47 and ESRD secondary to poorly controlled DM2 on dialysis for over a year.  He is also had complications from lymphedema and cellulitis of his left leg.  He also recently went through a divorce.  He reports over the last 18 months or so he has had trouble with erections and rarely gets them in the morning or overnight.  He has not tried any medications for this.  He is a non-smoker.  He is in a new relationship and is interested in any options for erections.  There are no prior testosterone values.  He makes minimal urine at baseline.  PMH: Past Medical History:  Diagnosis Date   Anxiety    Chronic kidney disease    14% FUNCTION   Depression    Diabetes mellitus without complication (HCC)    Dyspnea    DOE   Dysrhythmia    History of orthopnea    Hypertension    Lymphedema of leg    Microalbuminuria    Sleep apnea    no CPAP   Vitamin D deficiency     Surgical History: Past Surgical History:  Procedure Laterality Date   A/V FISTULAGRAM Right 10/24/2018   Procedure: A/V FISTULAGRAM;  Surgeon: Katha Cabal, MD;  Location: [place] [facility];  Service: Cardiovascular;  Laterality: Right;   A/V FISTULAGRAM Right 11/24/2018   Procedure: A/V FISTULAGRAM;  Surgeon: Katha Cabal, MD;  Location: [place] [facility];  Service: Cardiovascular;  Laterality: Right;   A/V FISTULAGRAM Left 07/31/2019   Procedure: A/V FISTULAGRAM;  Surgeon: Katha Cabal, MD;  Location: [place] [facility];  Service: Cardiovascular;  Laterality: Left;   A/V FISTULAGRAM Left 12/11/2019   Procedure: A/V FISTULAGRAM;  Surgeon: Katha Cabal, MD;  Location: [place] [facility];  Service: Cardiovascular;   Laterality: Left;   A/V SHUNT INTERVENTION Right 02/21/2019   Procedure: A/V SHUNT INTERVENTION;  Surgeon: Katha Cabal, MD;  Location: [place] [facility];  Service: Cardiovascular;  Laterality: Right;   APPLICATION OF WOUND VAC Left 10/03/2017   Procedure: APPLICATION OF WOUND VAC;  Surgeon: Algernon Huxley, MD;  Location: [facility];  Service: General;  Laterality: Left;   APPLICATION OF WOUND VAC Left 10/11/2017   Procedure: APPLICATION OF WOUND VAC;  Surgeon: Katha Cabal, MD;  Location: [facility];  Service: Vascular;  Laterality: Left;   APPLICATION OF WOUND VAC Left 10/14/2017   Procedure: WOUND VAC CHANGE;  Surgeon: Katha Cabal, MD;  Location: [facility];  Service: Vascular;  Laterality: Left;  left lower leg   APPLICATION OF WOUND VAC Left 10/18/2017   Procedure: WOUND VAC CHANGE;  Surgeon: Katha Cabal, MD;  Location: [facility];  Service: Vascular;  Laterality: Left;   APPLICATION OF WOUND VAC Left 10/07/2017   Procedure: APPLICATION OF WOUND VAC;  Surgeon: Katha Cabal, MD;  Location: [facility];  Service: Vascular;  Laterality: Left;   AV FISTULA INSERTION W/ RF MAGNETIC GUIDANCE Right 07/19/2018   Procedure: AV FISTULA INSERTION W/RF MAGNETIC GUIDANCE;  Surgeon: Katha Cabal, MD;  Location: [place] [facility];  Service: Cardiovascular;  Laterality: Right;   AV FISTULA PLACEMENT Left 05/11/2019   Procedure: ARTERIOVENOUS (AV) FISTULA CREATION (  RADIOCEPHALIC );  Surgeon: Katha Cabal, MD;  Location: [facility];  Service: Vascular;  Laterality: Left;   CATARACT EXTRACTION W/PHACO Left 11/01/2018   Procedure: CATARACT EXTRACTION PHACO AND INTRAOCULAR LENS PLACEMENT (IOC)-LEFT, DIABETIC-INSULIN DEPENDENT;  Surgeon: Eulogio Bear, MD;  Location: [facility];  Service: Ophthalmology;  Laterality: Left;  [place] 00:41.8 CDE 4.61 Fluid Pack Lot # [id number]   CATARACT EXTRACTION W/PHACO Right 04/27/2019   Procedure: CATARACT EXTRACTION PHACO AND INTRAOCULAR LENS  PLACEMENT (IOC);  Surgeon: Eulogio Bear, MD;  Location: [facility];  Service: Ophthalmology;  Laterality: Right;  [place] 00:34 CDE 1.97 FLUID PACK LOT # [id number]    DIALYSIS/PERMA CATHETER INSERTION N/A 02/23/2019   Procedure: DIALYSIS/PERMA CATHETER INSERTION;  Surgeon: Algernon Huxley, MD;  Location: [place] [facility];  Service: Cardiovascular;  Laterality: N/A;   I & D EXTREMITY Left 10/11/2017   Procedure: IRRIGATION AND DEBRIDEMENT EXTREMITY;  Surgeon: Katha Cabal, MD;  Location: [facility];  Service: Vascular;  Laterality: Left;   INCISION AND DRAINAGE ABSCESS Right 10/07/2017   Procedure: INCISION AND DRAINAGE ABSCESS;  Surgeon: Katha Cabal, MD;  Location: [facility];  Service: Vascular;  Laterality: Right;   IRRIGATION AND DEBRIDEMENT ABSCESS Left 10/03/2017   Procedure: IRRIGATION AND DEBRIDEMENT ABSCESS with debridement of skin, soft tissue, muscle 50sq cm;  Surgeon: Algernon Huxley, MD;  Location: [facility];  Service: General;  Laterality: Left;   TEMPORARY DIALYSIS CATHETER  02/20/2019   Procedure: TEMPORARY DIALYSIS CATHETER;  Surgeon: Katha Cabal, MD;  Location: [place] [facility];  Service: Cardiovascular;;   UPPER EXTREMITY ANGIOGRAPHY Left 09/18/2019   Procedure: UPPER EXTREMITY ANGIOGRAPHY;  Surgeon: Katha Cabal, MD;  Location: [place] [facility];  Service: Cardiovascular;  Laterality: Left;    Family History: Family History  Problem Relation Age of Onset   Diabetes Father    Kidney disease Father    Diabetes Daughter     Social History:  reports that he has quit smoking. His smoking use included cigars. He quit smokeless tobacco use about 2 years ago. He reports current alcohol use of about 1.0 standard drinks of alcohol per week. He reports previous drug use.  Physical Exam: BP 136/74    Pulse 63    Ht 6\' 4"  (1.93 m)    Wt (!) 389 lb (176.4 kg)    BMI 47.35 kg/m    Constitutional:  Alert and oriented, No acute distress.  Obese    Cardiovascular: No clubbing, cyanosis, or edema. Respiratory: Normal respiratory effort, no increased work of breathing. GI: Abdomen is soft, nontender, nondistended, no abdominal masses GU: Phallus with widely patent meatus, testicles 20 cc and descended bilaterally, buried penis from obesity  Laboratory Data: Reviewed, see HPI   Assessment & Plan:   In summary, he is an extremely comorbid 49 year old male with erectile dysfunction likely secondary to poorly controlled diabetes and morbid obesity.  I had a very frank conversation with the patient about his risk factors for erectile dysfunction and treatment options.  We discussed the AUA guidelines that recommend checking testosterone in patients with erectile dysfunction.  We also discussed treatment strategies ranging from behavioral strategies of weight loss and exercise, PDE 5 inhibitors, vacuum erection device, penile injections, and penile prosthesis.  He is interested in a trial of PDE 5 inhibitors, and we discussed the risks and benefits at length.  Check AM testosterone level, call with results Trial of Cialis 10 mg on demand RTC 1 month for symptom  check  I spent 60 total minutes on the day of the encounter including pre-visit review of the medical record, face-to-face time with the patient, and post visit ordering of labs/imaging/tests.  [name], MD 02/05/2020  [facility] [address], [place], [place] [address] [phone]

## 2020-02-05 NOTE — Patient Instructions (Signed)
Erectile Dysfunction Erectile dysfunction (ED) is the inability to get or keep an erection in order to have sexual intercourse. Erectile dysfunction may include:  Inability to get an erection.  Lack of enough hardness of the erection to allow penetration.  Loss of the erection before sex is finished. What are the causes? This condition may be caused by:  Certain medicines, such as: ? Pain relievers. ? Antihistamines. ? Antidepressants. ? Blood pressure medicines. ? Water pills (diuretics). ? Ulcer medicines. ? Muscle relaxants. ? Drugs.  Excessive drinking.  Psychological causes, such as: ? Anxiety. ? Depression. ? Sadness. ? Exhaustion. ? Performance fear. ? Stress.  Physical causes, such as: ? Artery problems. This may include diabetes, smoking, liver disease, or atherosclerosis. ? High blood pressure. ? Hormonal problems, such as low testosterone. ? Obesity. ? Nerve problems. This may include back or pelvic injuries, diabetes mellitus, multiple sclerosis, or Parkinson disease. What are the signs or symptoms? Symptoms of this condition include:  Inability to get an erection.  Lack of enough hardness of the erection to allow penetration.  Loss of the erection before sex is finished.  Normal erections at some times, but with frequent unsatisfactory episodes.  Low sexual satisfaction in either partner due to erection problems.  A curved penis occurring with erection. The curve may cause pain or the penis may be too curved to allow for intercourse.  Never having nighttime erections. How is this diagnosed? This condition is often diagnosed by:  Performing a physical exam to find other diseases or specific problems with the penis.  Asking you detailed questions about the problem.  Performing blood tests to check for diabetes mellitus or to measure hormone levels.  Performing other tests to check for underlying health conditions.  Performing an ultrasound  exam to check for scarring.  Performing a test to check blood flow to the penis.  Doing a sleep study at home to measure nighttime erections. How is this treated? This condition may be treated by:  Medicine taken by mouth to help you achieve an erection (oral medicine).  Hormone replacement therapy to replace low testosterone levels.  Medicine that is injected into the penis. Your health care provider may instruct you how to give yourself these injections at home.  Vacuum pump. This is a pump with a ring on it. The pump and ring are placed on the penis and used to create pressure that helps the penis become erect.  Penile implant surgery. In this procedure, you may receive: ? An inflatable implant. This consists of cylinders, a pump, and a reservoir. The cylinders can be inflated with a fluid that helps to create an erection, and they can be deflated after intercourse. ? A semi-rigid implant. This consists of two silicone rubber rods. The rods provide some rigidity. They are also flexible, so the penis can both curve downward in its normal position and become straight for sexual intercourse.  Blood vessel surgery, to improve blood flow to the penis. During this procedure, a blood vessel from a different part of the body is placed into the penis to allow blood to flow around (bypass) damaged or blocked blood vessels.  Lifestyle changes, such as exercising more, losing weight, and quitting smoking. Follow these instructions at home: Medicines   Take over-the-counter and prescription medicines only as told by your health care provider. Do not increase the dosage without first discussing it with your health care provider.  If you are using self-injections, perform injections as directed by your   health care provider. Make sure to avoid any veins that are on the surface of the penis. After giving an injection, apply pressure to the injection site for 5 minutes. General  instructions  Exercise regularly, as directed by your health care provider. Work with your health care provider to lose weight, if needed.  Do not use any products that contain nicotine or tobacco, such as cigarettes and e-cigarettes. If you need help quitting, ask your health care provider.  Before using a vacuum pump, read the instructions that come with the pump and discuss any questions with your health care provider.  Keep all follow-up visits as told by your health care provider. This is important. Contact a health care provider if:  You feel nauseous.  You vomit. Get help right away if:  You are taking oral or injectable medicines and you have an erection that lasts longer than 4 hours. If your health care provider is unavailable, go to the nearest emergency room for evaluation. An erection that lasts much longer than 4 hours can result in permanent damage to your penis.  You have severe pain in your groin or abdomen.  You develop redness or severe swelling of your penis.  You have redness spreading up into your groin or lower abdomen.  You are unable to urinate.  You experience chest pain or a rapid heart beat (palpitations) after taking oral medicines. Summary  Erectile dysfunction (ED) is the inability to get or keep an erection during sexual intercourse. This problem can usually be treated successfully.  This condition is diagnosed based on a physical exam, your symptoms, and tests to determine the cause. Treatment varies depending on the cause, and may include medicines, hormone therapy, surgery, or vacuum pump.  You may need follow-up visits to make sure that you are using your medicines or devices correctly.  Get help right away if you are taking or injecting medicines and you have an erection that lasts longer than 4 hours. This information is not intended to replace advice given to you by your health care provider. Make sure you discuss any questions you have with  your health care provider. Document Revised: 07/29/2017 Document Reviewed: 09/01/2016 Elsevier Patient Education  [place].  Tadalafil tablets (Cialis) What is this medicine? TADALAFIL (tah DA la fil) is used to treat erection problems in men. It is also used for enlargement of the prostate gland in men, a condition called benign prostatic hyperplasia or BPH. This medicine improves urine flow and reduces BPH symptoms. This medicine can also treat both erection problems and BPH when they occur together. This medicine may be used for other purposes; ask your health care provider or pharmacist if you have questions. COMMON BRAND NAME(S): Kathaleen Bury, Cialis What should I tell my health care provider before I take this medicine? They need to know if you have any of these conditions:  bleeding disorders  eye or vision problems, including a rare inherited eye disease called retinitis pigmentosa  anatomical deformation of the penis, Peyronie's disease, or history of priapism (painful and prolonged erection)  heart disease, angina, a history of heart attack, irregular heart beats, or other heart problems  high or low blood pressure  history of blood diseases, like sickle cell anemia or leukemia  history of stomach bleeding  kidney disease  liver disease  stroke  an unusual or allergic reaction to tadalafil, other medicines, foods, dyes, or preservatives  pregnant or trying to get pregnant  breast-feeding How should I  use this medicine? Take this medicine by mouth with a glass of water. Follow the directions on the prescription label. You may take this medicine with or without meals. When this medicine is used for erection problems, your doctor may prescribe it to be taken once daily or as needed. If you are taking the medicine as needed, you may be able to have sexual activity 30 minutes after taking it and for up to 36 hours after taking it. Whether you are taking the  medicine as needed or once daily, you should not take more than one dose per day. If you are taking this medicine for symptoms of benign prostatic hyperplasia (BPH) or to treat both BPH and an erection problem, take the dose once daily at about the same time each day. Do not take your medicine more often than directed. Talk to your pediatrician regarding the use of this medicine in children. Special care may be needed. Overdosage: If you think you have taken too much of this medicine contact a poison control center or emergency room at once. NOTE: This medicine is only for you. Do not share this medicine with others. What if I miss a dose? If you are taking this medicine as needed for erection problems, this does not apply. If you miss a dose while taking this medicine once daily for an erection problem, benign prostatic hyperplasia, or both, take it as soon as you remember, but do not take more than one dose per day. What may interact with this medicine? Do not take this medicine with any of the following medications:  nitrates like amyl nitrite, isosorbide dinitrate, isosorbide mononitrate, nitroglycerin  other medicines for erectile dysfunction like avanafil, sildenafil, vardenafil  other tadalafil products ([employer]circa)  riociguat This medicine may also interact with the following medications:  certain drugs for high blood pressure  certain drugs for the treatment of HIV infection or AIDS  certain drugs used for fungal or yeast infections, like fluconazole, itraconazole, ketoconazole, and voriconazole  certain drugs used for seizures like carbamazepine, phenytoin, and phenobarbital  grapefruit juice  macrolide antibiotics like clarithromycin, erythromycin, troleandomycin  medicines for prostate problems  rifabutin, rifampin or rifapentine This list may not describe all possible interactions. Give your health care provider a list of all the medicines, herbs, non-prescription drugs, or  dietary supplements you use. Also tell them if you smoke, drink alcohol, or use illegal drugs. Some items may interact with your medicine. What should I watch for while using this medicine? If you notice any changes in your vision while taking this drug, call your doctor or health care professional as soon as possible. Stop using this medicine and call your health care provider right away if you have a loss of sight in one or both eyes. Contact your doctor or health care professional right away if the erection lasts longer than 4 hours or if it becomes painful. This may be a sign of serious problem and must be treated right away to prevent permanent damage. If you experience symptoms of nausea, dizziness, chest pain or arm pain upon initiation of sexual activity after taking this medicine, you should refrain from further activity and call your doctor or health care professional as soon as possible. Do not drink alcohol to excess (examples, 5 glasses of wine or 5 shots of whiskey) when taking this medicine. When taken in excess, alcohol can increase your chances of getting a headache or getting dizzy, increasing your heart rate or lowering your blood pressure.  Using this medicine does not protect you or your partner against HIV infection (the virus that causes AIDS) or other sexually transmitted diseases. What side effects may I notice from receiving this medicine? Side effects that you should report to your doctor or health care professional as soon as possible:  allergic reactions like skin rash, itching or hives, swelling of the face, lips, or tongue  breathing problems  changes in hearing  changes in vision  chest pain  fast, irregular heartbeat  prolonged or painful erection  seizures Side effects that usually do not require medical attention (report to your doctor or health care professional if they continue or are bothersome):  back  pain  dizziness  flushing  headache  indigestion  muscle aches  nausea  stuffy or runny nose This list may not describe all possible side effects. Call your doctor for medical advice about side effects. You may report side effects to FDA at 1-800-FDA-1088. Where should I keep my medicine? Keep out of the reach of children. Store at room temperature between 15 and 30 degrees C (59 and 86 degrees F). Throw away any unused medicine after the expiration date. NOTE: This sheet is a summary. It may not cover all possible information. If you have questions about this medicine, talk to your doctor, pharmacist, or health care provider.  2020 Elsevier/Gold Standard (2014-01-04 13:15:49)

## 2020-02-07 ENCOUNTER — Ambulatory Visit (INDEPENDENT_AMBULATORY_CARE_PROVIDER_SITE_OTHER): Payer: Medicare Other | Admitting: Vascular Surgery

## 2020-02-07 ENCOUNTER — Other Ambulatory Visit: Payer: Self-pay

## 2020-02-07 ENCOUNTER — Encounter (INDEPENDENT_AMBULATORY_CARE_PROVIDER_SITE_OTHER): Payer: Self-pay | Admitting: Vascular Surgery

## 2020-02-07 ENCOUNTER — Other Ambulatory Visit
Admission: RE | Admit: 2020-02-07 | Discharge: 2020-02-07 | Disposition: A | Discharge status: Home / Self Care | Payer: Medicare Other | Attending: Urology | Admitting: Urology

## 2020-02-07 ENCOUNTER — Ambulatory Visit (INDEPENDENT_AMBULATORY_CARE_PROVIDER_SITE_OTHER): Payer: Medicare Other

## 2020-02-07 VITALS — BP 143/78 | HR 76 | Resp 16 | Wt 385.8 lb

## 2020-02-07 DIAGNOSIS — E1059 Type 1 diabetes mellitus with other circulatory complications: Secondary | ICD-10-CM

## 2020-02-07 DIAGNOSIS — N529 Male erectile dysfunction, unspecified: Secondary | ICD-10-CM

## 2020-02-07 DIAGNOSIS — T829XXS Unspecified complication of cardiac and vascular prosthetic device, implant and graft, sequela: Secondary | ICD-10-CM

## 2020-02-07 DIAGNOSIS — I10 Essential (primary) hypertension: Secondary | ICD-10-CM | POA: Diagnosis not present

## 2020-02-07 DIAGNOSIS — N186 End stage renal disease: Secondary | ICD-10-CM | POA: Diagnosis not present

## 2020-02-07 NOTE — Progress Notes (Signed)
MRN : [id number]  [name] is a 49 y.o. (07/24/1971) male who presents with chief complaint of No chief complaint on file. [name]  History of Present Illness:   The patient returns to the office for follow up regarding problem with the dialysis access. Currently the patient is maintained via a tunneled catheter.  The patient has had multiple failed upper extremity accesses.  Most recently he had angioplasty and stent placement in his right radiocephalic fistula.  Although the vein is easily palpable the dialysis center has been unable to routinely access his fistula.  And they have continued to use his catheter.  The patient denies hand pain or other symptoms consistent with steal phenomena.  No significant arm swelling.  The patient denies redness or swelling at the access site. The patient denies fever or chills at home or while on dialysis.  The patient denies amaurosis fugax or recent TIA symptoms. There are no recent neurological changes noted. The patient denies claudication symptoms or rest pain symptoms. The patient denies history of DVT, PE or superficial thrombophlebitis. The patient denies recent episodes of angina or shortness of breath.      No outpatient medications have been marked as taking for the 02/07/20 encounter (Appointment) with Delana Meyer, Dolores Lory, MD.    Past Medical History:  Diagnosis Date  . Anxiety   . Chronic kidney disease    14% FUNCTION  . Depression   . Diabetes mellitus without complication ([place])   . Dyspnea    DOE  . Dysrhythmia   . History of orthopnea   . Hypertension   . Lymphedema of leg   . Microalbuminuria   . Sleep apnea    no CPAP  . Vitamin D deficiency     Past Surgical History:  Procedure Laterality Date  . A/V FISTULAGRAM Right 10/24/2018   Procedure: A/V FISTULAGRAM;  Surgeon: Katha Cabal, MD;  Location: [facility];  Service: Cardiovascular;  Laterality: Right;  . A/V FISTULAGRAM Right 11/24/2018    Procedure: A/V FISTULAGRAM;  Surgeon: Katha Cabal, MD;  Location: [facility];  Service: Cardiovascular;  Laterality: Right;  . A/V FISTULAGRAM Left 07/31/2019   Procedure: A/V FISTULAGRAM;  Surgeon: Katha Cabal, MD;  Location: [facility];  Service: Cardiovascular;  Laterality: Left;  . A/V FISTULAGRAM Left 12/11/2019   Procedure: A/V FISTULAGRAM;  Surgeon: Katha Cabal, MD;  Location: [facility];  Service: Cardiovascular;  Laterality: Left;  . A/V SHUNT INTERVENTION Right 02/21/2019   Procedure: A/V SHUNT INTERVENTION;  Surgeon: Katha Cabal, MD;  Location: [facility];  Service: Cardiovascular;  Laterality: Right;  . APPLICATION OF WOUND VAC Left 10/03/2017   Procedure: APPLICATION OF WOUND VAC;  Surgeon: Algernon Huxley, MD;  Location: [facility];  Service: General;  Laterality: Left;  . APPLICATION OF WOUND VAC Left 10/11/2017   Procedure: APPLICATION OF WOUND VAC;  Surgeon: Katha Cabal, MD;  Location: [facility];  Service: Vascular;  Laterality: Left;  . APPLICATION OF WOUND VAC Left 10/14/2017   Procedure: WOUND VAC CHANGE;  Surgeon: Katha Cabal, MD;  Location: [facility];  Service: Vascular;  Laterality: Left;  left lower leg  . APPLICATION OF WOUND VAC Left 10/18/2017   Procedure: WOUND VAC CHANGE;  Surgeon: Katha Cabal, MD;  Location: [facility];  Service: Vascular;  Laterality: Left;  . APPLICATION OF WOUND VAC Left 10/07/2017   Procedure: APPLICATION OF WOUND VAC;  Surgeon: Katha Cabal, MD;  Location: [facility];  Service: Vascular;  Laterality: Left;  . AV FISTULA INSERTION W/ RF MAGNETIC GUIDANCE Right 07/19/2018   Procedure: AV FISTULA INSERTION W/RF MAGNETIC GUIDANCE;  Surgeon: Katha Cabal, MD;  Location: [facility];  Service: Cardiovascular;  Laterality: Right;  . AV FISTULA PLACEMENT Left 05/11/2019   Procedure: ARTERIOVENOUS (AV) FISTULA CREATION (RADIOCEPHALIC );  Surgeon: Katha Cabal,  MD;  Location: [facility];  Service: Vascular;  Laterality: Left;  . CATARACT EXTRACTION W/PHACO Left 11/01/2018   Procedure: CATARACT EXTRACTION PHACO AND INTRAOCULAR LENS PLACEMENT (IOC)-LEFT, DIABETIC-INSULIN DEPENDENT;  Surgeon: Eulogio Bear, MD;  Location: [facility];  Service: Ophthalmology;  Laterality: Left;  [place] 00:41.8 CDE 4.61 Fluid Pack Lot # [id number]  . CATARACT EXTRACTION W/PHACO Right 04/27/2019   Procedure: CATARACT EXTRACTION PHACO AND INTRAOCULAR LENS PLACEMENT (IOC);  Surgeon: Eulogio Bear, MD;  Location: [facility];  Service: Ophthalmology;  Laterality: Right;  [place] 00:34 CDE 1.97 FLUID PACK LOT # [id number]   . DIALYSIS/PERMA CATHETER INSERTION N/A 02/23/2019   Procedure: DIALYSIS/PERMA CATHETER INSERTION;  Surgeon: Algernon Huxley, MD;  Location: [facility];  Service: Cardiovascular;  Laterality: N/A;  . I & D EXTREMITY Left 10/11/2017   Procedure: IRRIGATION AND DEBRIDEMENT EXTREMITY;  Surgeon: Katha Cabal, MD;  Location: [facility];  Service: Vascular;  Laterality: Left;  . INCISION AND DRAINAGE ABSCESS Right 10/07/2017   Procedure: INCISION AND DRAINAGE ABSCESS;  Surgeon: Katha Cabal, MD;  Location: [facility];  Service: Vascular;  Laterality: Right;  . IRRIGATION AND DEBRIDEMENT ABSCESS Left 10/03/2017   Procedure: IRRIGATION AND DEBRIDEMENT ABSCESS with debridement of skin, soft tissue, muscle 50sq cm;  Surgeon: Algernon Huxley, MD;  Location: [facility];  Service: General;  Laterality: Left;  . TEMPORARY DIALYSIS CATHETER  02/20/2019   Procedure: TEMPORARY DIALYSIS CATHETER;  Surgeon: Katha Cabal, MD;  Location: [facility];  Service: Cardiovascular;;  . UPPER EXTREMITY ANGIOGRAPHY Left 09/18/2019   Procedure: UPPER EXTREMITY ANGIOGRAPHY;  Surgeon: Katha Cabal, MD;  Location: [place] [facility];  Service: Cardiovascular;  Laterality: Left;    Social History Social History   Tobacco Use  . Smoking status: Former Smoker    Types:  Cigars  . Smokeless tobacco: Former Systems developer    Quit date: 09/30/2017  . Tobacco comment: occasional  Vaping Use  . Vaping Use: Never used  Substance Use Topics  . Alcohol use: Yes    Alcohol/week: 1.0 standard drink    Types: 1 Cans of beer per week    Comment: beer  . Drug use: Not Currently    Family History Family History  Problem Relation Age of Onset  . Diabetes Father   . Kidney disease Father   . Diabetes Daughter     Allergies  Allergen Reactions  . Lisinopril Swelling  . Adhesive [Tape] Itching  . Furosemide Cough    Light headed, blurry vision, weakness.  Claudius Sis Dressing [Lidocaine (Anorectal)]      REVIEW OF SYSTEMS (Negative unless checked)  Constitutional: [] Weight loss  [] Fever  [] Chills Cardiac: [] Chest pain   [] Chest pressure   [] Palpitations   [] Shortness of breath when laying flat   [] Shortness of breath with exertion. Vascular:  [] Pain in legs with walking   [] Pain in legs at rest  [] History of DVT   [] Phlebitis   [] Swelling in legs   [] Varicose veins   [] Non-healing ulcers Pulmonary:   [] Uses  home oxygen   [] Productive cough   [] Hemoptysis   [] Wheeze  [] COPD   [] Asthma Neurologic:  [] Dizziness   [] Seizures   [] History of stroke   [] History of TIA  [] Aphasia   [] Vissual changes   [] Weakness or numbness in arm   [] Weakness or numbness in leg Musculoskeletal:   [] Joint swelling   [] Joint pain   [] Low back pain Hematologic:  [] Easy bruising  [] Easy bleeding   [] Hypercoagulable state   [] Anemic Gastrointestinal:  [] Diarrhea   [] Vomiting  [] Gastroesophageal reflux/heartburn   [] Difficulty swallowing. Genitourinary:  [x] Chronic kidney disease   [] Difficult urination  [] Frequent urination   [] Blood in urine Skin:  [] Rashes   [] Ulcers  Psychological:  [] History of anxiety   []  History of major depression.  Physical Examination  There were no vitals filed for this visit. There is no height or weight on file to calculate BMI. Gen: WD/WN,  NAD Head: [place]/AT, No temporalis wasting.  Ear/Nose/Throat: Hearing grossly intact, nares w/o erythema or drainage Eyes: PER, EOMI, sclera nonicteric.  Neck: Supple, no large masses.   Pulmonary:  Good air movement, no audible wheezing bilaterally, no use of accessory muscles.  Cardiac: RRR, no JVD Vascular: Left forearm radiocephalic fistula palpable thrill audible bruit Vessel Right Left  Radial Palpable Palpable  Brachial Palpable Palpable  Gastrointestinal: Non-distended. No guarding/no peritoneal signs.  Musculoskeletal: M/S 5/5 throughout.  No deformity or atrophy.  Neurologic: CN 2-12 intact. Symmetrical.  Speech is fluent. Motor exam as listed above. Psychiatric: Judgment intact, Mood & affect appropriate for pt's clinical situation. Dermatologic: No rashes or ulcers noted.  No changes consistent with cellulitis.  CBC Lab Results  Component Value Date   WBC 5.7 05/08/2019   HGB 14.3 05/11/2019   HCT 42.0 05/11/2019   MCV 96.6 05/08/2019   PLT 199 05/08/2019    BMET    Component Value Date/Time   NA 136 05/11/2019 0617   K 4.0 05/11/2019 0617   CL 98 05/08/2019 1333   CO2 23 05/08/2019 1333   GLUCOSE 216 (H) 05/11/2019 0617   BUN 56 (H) 05/08/2019 1333   CREATININE 12.50 (H) 05/08/2019 1333   CALCIUM 8.6 (L) 05/08/2019 1333   GFRNONAA 4 (L) 05/08/2019 1333   GFRAA 5 (L) 05/08/2019 1333   CrCl cannot be calculated (Patient's most recent lab result is older than the maximum 21 days allowed.).  COAG Lab Results  Component Value Date   INR 1.0 05/08/2019   INR 1.0 02/22/2019   INR 1.09 07/14/2018    Radiology No results found.   Assessment/Plan 1. Complication of vascular access for dialysis, sequela Recommend:  At this time the patient does not have appropriate extremity access for dialysis  Patient should have a left forearm loop graft created in the radiocephalic fistula can be ligated.  The risks, benefits and alternative therapies were reviewed in  detail with the patient.  All questions were answered.  The patient agrees to proceed with surgery.    2. End-stage renal disease ([place]) At the present time the patient has adequate dialysis access.  Continue hemodialysis as ordered without interruption.  Avoid nephrotoxic medications and dehydration.  Further plans per nephrology  3. Essential hypertension Continue antihypertensive medications as already ordered, these medications have been reviewed and there are no changes at this time.   4. Type 1 diabetes mellitus with other circulatory complication (HCC) Continue hypoglycemic medications as already ordered, these medications have been reviewed and there are no changes at this time.  Hgb  A1C to be monitored as already arranged by primary service    [name], MD  02/07/2020 9:14 AM

## 2020-02-08 ENCOUNTER — Ambulatory Visit (INDEPENDENT_AMBULATORY_CARE_PROVIDER_SITE_OTHER): Payer: Medicare Other | Admitting: Nurse Practitioner

## 2020-02-08 ENCOUNTER — Ambulatory Visit (INDEPENDENT_AMBULATORY_CARE_PROVIDER_SITE_OTHER): Payer: Medicare Other

## 2020-02-08 ENCOUNTER — Encounter (INDEPENDENT_AMBULATORY_CARE_PROVIDER_SITE_OTHER): Payer: Self-pay | Admitting: Nurse Practitioner

## 2020-02-08 ENCOUNTER — Other Ambulatory Visit (INDEPENDENT_AMBULATORY_CARE_PROVIDER_SITE_OTHER): Payer: Self-pay | Admitting: Vascular Surgery

## 2020-02-08 VITALS — BP 151/76 | HR 74 | Resp 16 | Wt 390.2 lb

## 2020-02-08 DIAGNOSIS — R600 Localized edema: Secondary | ICD-10-CM | POA: Diagnosis not present

## 2020-02-08 DIAGNOSIS — M7989 Other specified soft tissue disorders: Secondary | ICD-10-CM

## 2020-02-08 DIAGNOSIS — I10 Essential (primary) hypertension: Secondary | ICD-10-CM | POA: Diagnosis not present

## 2020-02-08 DIAGNOSIS — N186 End stage renal disease: Secondary | ICD-10-CM | POA: Diagnosis not present

## 2020-02-08 DIAGNOSIS — I890 Lymphedema, not elsewhere classified: Secondary | ICD-10-CM

## 2020-02-08 LAB — TESTOSTERONE: Testosterone: 228 ng/dL — ABNORMAL LOW (ref 264–916)

## 2020-02-11 ENCOUNTER — Telehealth: Payer: Self-pay

## 2020-02-11 ENCOUNTER — Encounter (INDEPENDENT_AMBULATORY_CARE_PROVIDER_SITE_OTHER): Payer: Self-pay | Admitting: Nurse Practitioner

## 2020-02-11 DIAGNOSIS — R7989 Other specified abnormal findings of blood chemistry: Secondary | ICD-10-CM

## 2020-02-11 NOTE — Telephone Encounter (Signed)
-----   Message from Billey Co, MD sent at 02/11/2020 11:50 AM EDT ----- T low, needs another AM T and LH prior to 1 mo follow up to confirm before replacing T, thanks  [name], MD 02/11/2020

## 2020-02-11 NOTE — Telephone Encounter (Signed)
-----   Message from Billey Co, MD sent at 02/11/2020 11:49 AM EDT ----- Testosterone was low, he needs another AM T and LH prior to our follow up to confirm per AUA guidelines, then if still low we can discuss replacement at his 1 month follow up visit, thanks  [name], MD 02/11/2020

## 2020-02-11 NOTE — Progress Notes (Signed)
Subjective:    Patient ID: [name], male    DOB: [birth year], 49 y.o.   MRN: [id number] Chief Complaint  Patient presents with  . Follow-up    ultrasound follow up    The patient presents today for work-up regarding left lower extremity pain.  The patient describes it as a shooting pain starting in his thigh area, which follows with fever like chills.  After this happens the patient is typically 5 there is no precipitating factors, the patient notes that the first time it happened was the day after Christmas and since that time it has happened about 5 times.  Nothing makes it better or worse and there is no predictable pattern.  The patient has had multiple vascular interventions and there was discussion of placement of a left forearm loop graft however we wanted to be certain that there was no evidence of abscess that could cause difficulties with graft placement.  Today noninvasive studies note no evidence of DVT in the left lower extremity.  No evidence of superficial venous thrombosis in the left lower extremity.  No evidence of superficial venous reflux in the left lower extremity.  No evidence of deep venous insufficiency in the left lower extremity.  There is an enlarged lymph node noted in the left groin area.   Review of Systems  Cardiovascular: Positive for leg swelling.  Musculoskeletal:       Leg pain  All other systems reviewed and are negative.      Objective:   Physical Exam Vitals reviewed.  Cardiovascular:     Rate and Rhythm: Normal rate and regular rhythm.     Pulses: Normal pulses.     Heart sounds: Normal heart sounds.  Pulmonary:     Effort: Pulmonary effort is normal.     Breath sounds: Normal breath sounds.  Musculoskeletal:        General: Normal range of motion.     Right lower leg: Edema present.     Left lower leg: Edema present.  Neurological:     Mental Status: He is alert and oriented to person, place, and time.  Psychiatric:        Mood  and Affect: Mood normal.        Behavior: Behavior normal.        Thought Content: Thought content normal.        Judgment: Judgment normal.     BP (!) 151/76 (BP Location: Right Arm)   Pulse 74   Resp 16   Wt (!) 390 lb 3.2 oz (177 kg)   BMI 47.50 kg/m   Past Medical History:  Diagnosis Date  . Anxiety   . Chronic kidney disease    14% FUNCTION  . Depression   . Diabetes mellitus without complication ([place])   . Dyspnea    DOE  . Dysrhythmia   . History of orthopnea   . Hypertension   . Lymphedema of leg   . Microalbuminuria   . Sleep apnea    no CPAP  . Vitamin D deficiency     Social History   Socioeconomic History  . Marital status: Divorced    Spouse name: Not on file  . Number of children: Not on file  . Years of education: Not on file  . Highest education level: Not on file  Occupational History  . Not on file  Tobacco Use  . Smoking status: Former Smoker    Types: Cigars  . Smokeless tobacco: Former  User    Quit date: 09/30/2017  . Tobacco comment: occasional  Vaping Use  . Vaping Use: Never used  Substance and Sexual Activity  . Alcohol use: Yes    Alcohol/week: 1.0 standard drink    Types: 1 Cans of beer per week    Comment: beer  . Drug use: Not Currently  . Sexual activity: Not on file  Other Topics Concern  . Not on file  Social History Narrative  . Not on file   Social Determinants of Health   Financial Resource Strain:   . Difficulty of Paying Living Expenses:   Food Insecurity:   . Worried About Charity fundraiser in the Last Year:   . Arboriculturist in the Last Year:   Transportation Needs:   . Film/video editor (Medical):   [name] Lack of Transportation (Non-Medical):   Physical Activity:   . Days of Exercise per Week:   . Minutes of Exercise per Session:   Stress:   . Feeling of Stress :   Social Connections:   . Frequency of Communication with Friends and Family:   . Frequency of Social Gatherings with Friends and  Family:   . Attends Religious Services:   . Active Member of Clubs or Organizations:   . Attends Archivist Meetings:   [name] Marital Status:   Intimate Partner Violence:   . Fear of Current or Ex-Partner:   . Emotionally Abused:   [name] Physically Abused:   . Sexually Abused:     Past Surgical History:  Procedure Laterality Date  . A/V FISTULAGRAM Right 10/24/2018   Procedure: A/V FISTULAGRAM;  Surgeon: Katha Cabal, MD;  Location: [facility];  Service: Cardiovascular;  Laterality: Right;  . A/V FISTULAGRAM Right 11/24/2018   Procedure: A/V FISTULAGRAM;  Surgeon: Katha Cabal, MD;  Location: [facility];  Service: Cardiovascular;  Laterality: Right;  . A/V FISTULAGRAM Left 07/31/2019   Procedure: A/V FISTULAGRAM;  Surgeon: Katha Cabal, MD;  Location: [facility];  Service: Cardiovascular;  Laterality: Left;  . A/V FISTULAGRAM Left 12/11/2019   Procedure: A/V FISTULAGRAM;  Surgeon: Katha Cabal, MD;  Location: [facility];  Service: Cardiovascular;  Laterality: Left;  . A/V SHUNT INTERVENTION Right 02/21/2019   Procedure: A/V SHUNT INTERVENTION;  Surgeon: Katha Cabal, MD;  Location: [facility];  Service: Cardiovascular;  Laterality: Right;  . APPLICATION OF WOUND VAC Left 10/03/2017   Procedure: APPLICATION OF WOUND VAC;  Surgeon: Algernon Huxley, MD;  Location: [facility];  Service: General;  Laterality: Left;  . APPLICATION OF WOUND VAC Left 10/11/2017   Procedure: APPLICATION OF WOUND VAC;  Surgeon: Katha Cabal, MD;  Location: [facility];  Service: Vascular;  Laterality: Left;  . APPLICATION OF WOUND VAC Left 10/14/2017   Procedure: WOUND VAC CHANGE;  Surgeon: Katha Cabal, MD;  Location: [facility];  Service: Vascular;  Laterality: Left;  left lower leg  . APPLICATION OF WOUND VAC Left 10/18/2017   Procedure: WOUND VAC CHANGE;  Surgeon: Katha Cabal, MD;  Location: [facility];  Service: Vascular;   Laterality: Left;  . APPLICATION OF WOUND VAC Left 10/07/2017   Procedure: APPLICATION OF WOUND VAC;  Surgeon: Katha Cabal, MD;  Location: [facility];  Service: Vascular;  Laterality: Left;  . AV FISTULA INSERTION W/ RF MAGNETIC GUIDANCE Right 07/19/2018   Procedure: AV FISTULA INSERTION W/RF MAGNETIC GUIDANCE;  Surgeon: Delana Meyer,  Dolores Lory, MD;  Location: [facility];  Service: Cardiovascular;  Laterality: Right;  . AV FISTULA PLACEMENT Left 05/11/2019   Procedure: ARTERIOVENOUS (AV) FISTULA CREATION (RADIOCEPHALIC );  Surgeon: Katha Cabal, MD;  Location: [facility];  Service: Vascular;  Laterality: Left;  . CATARACT EXTRACTION W/PHACO Left 11/01/2018   Procedure: CATARACT EXTRACTION PHACO AND INTRAOCULAR LENS PLACEMENT (IOC)-LEFT, DIABETIC-INSULIN DEPENDENT;  Surgeon: Eulogio Bear, MD;  Location: [facility];  Service: Ophthalmology;  Laterality: Left;  [place] 00:41.8 CDE 4.61 Fluid Pack Lot # [id number]  . CATARACT EXTRACTION W/PHACO Right 04/27/2019   Procedure: CATARACT EXTRACTION PHACO AND INTRAOCULAR LENS PLACEMENT (IOC);  Surgeon: Eulogio Bear, MD;  Location: [facility];  Service: Ophthalmology;  Laterality: Right;  [place] 00:34 CDE 1.97 FLUID PACK LOT # [id number]   . DIALYSIS/PERMA CATHETER INSERTION N/A 02/23/2019   Procedure: DIALYSIS/PERMA CATHETER INSERTION;  Surgeon: Algernon Huxley, MD;  Location: [facility];  Service: Cardiovascular;  Laterality: N/A;  . I & D EXTREMITY Left 10/11/2017   Procedure: IRRIGATION AND DEBRIDEMENT EXTREMITY;  Surgeon: Katha Cabal, MD;  Location: [facility];  Service: Vascular;  Laterality: Left;  . INCISION AND DRAINAGE ABSCESS Right 10/07/2017   Procedure: INCISION AND DRAINAGE ABSCESS;  Surgeon: Katha Cabal, MD;  Location: [facility];  Service: Vascular;  Laterality: Right;  . IRRIGATION AND DEBRIDEMENT ABSCESS Left 10/03/2017   Procedure: IRRIGATION AND DEBRIDEMENT ABSCESS with debridement of skin, soft tissue, muscle 50sq cm;   Surgeon: Algernon Huxley, MD;  Location: [facility];  Service: General;  Laterality: Left;  . TEMPORARY DIALYSIS CATHETER  02/20/2019   Procedure: TEMPORARY DIALYSIS CATHETER;  Surgeon: Katha Cabal, MD;  Location: [facility];  Service: Cardiovascular;;  . UPPER EXTREMITY ANGIOGRAPHY Left 09/18/2019   Procedure: UPPER EXTREMITY ANGIOGRAPHY;  Surgeon: Katha Cabal, MD;  Location: [facility];  Service: Cardiovascular;  Laterality: Left;    Family History  Problem Relation Age of Onset  . Diabetes Father   . Kidney disease Father   . Diabetes Daughter     Allergies  Allergen Reactions  . Lisinopril Swelling  . Adhesive [Tape] Itching  . Furosemide Cough    Light headed, blurry vision, weakness.  Claudius Sis Dressing [Lidocaine (Anorectal)]        Assessment & Plan:   1. End-stage renal disease ([place]) Recommend:  At this time the patient does not have appropriate extremity access for dialysis  Patient should have a left forearm loop graft created.  The risks, benefits and alternative therapies were reviewed in detail with the patient.  All questions were answered.  The patient agrees to proceed with surgery.    2. Lymphedema Patient is adhering to conservative therapy very well.  He continues to wear medical grade 1 compression stockings on a daily basis with exercise and elevation.  He is diligent with utilizing his lymphedema pump.  Patient will continue with annual follow-up.  3. Essential hypertension Continue antihypertensive medications as already ordered, these medications have been reviewed and there are no changes at this time.    Current Outpatient Medications on File Prior to Visit  Medication Sig Dispense Refill  . allopurinol (ZYLOPRIM) 100 MG tablet Take 100 mg by mouth daily.    [name] amLODipine (NORVASC) 10 MG tablet Take 10 mg by mouth once a week.     . Ascorbic Acid (VITAMIN C WITH ROSE HIPS) 1000 MG tablet Take 1,000 mg  by mouth 2 (two)  times a week.     [name] atenolol (TENORMIN) 50 MG tablet Take 50 mg by mouth once a week.     . B Complex-C-Folic Acid (RENA-VITE RX) 1 MG TABS Take 1 tablet by mouth daily.    . calcium acetate (PHOSLO) 667 MG capsule Take 667-2,001 mg by mouth See admin instructions. Take 2001 mg by mouth with each meal & take 1334 mg by mouth with each snack.    . cinacalcet (SENSIPAR) 30 MG tablet Take 30 mg by mouth daily.    [name] CINNAMON PO Take 1,000 mg by mouth daily.     [name] ibuprofen (ADVIL) 800 MG tablet Take 800 mg by mouth every 8 (eight) hours as needed for moderate pain.     . Insulin Glargine (LANTUS SOLOSTAR) 100 UNIT/ML Solostar Pen Inject 14 Units into the skin daily.     [name] loratadine (CLARITIN) 10 MG tablet Take 10 mg by mouth daily as needed for allergies.    . melatonin 5 MG TABS Take 5 mg by mouth at bedtime.     . metolazone (ZAROXOLYN) 5 MG tablet Take 5 mg by mouth every Monday, Wednesday, and Friday.     . Multiple Vitamin (MULTIVITAMIN WITH MINERALS) TABS tablet Take 1 tablet by mouth every Tuesday, Thursday, and Saturday at 6 PM. Men's Multivitamin    . Multiple Vitamins-Minerals (EYE HEALTH PO) Take 1 capsule by mouth daily.     . Omega-3 1000 MG CAPS Take 1,000 mg by mouth daily.    . tadalafil (CIALIS) 10 MG tablet Take 1 tablet (10 mg total) by mouth daily as needed for erectile dysfunction. 30 tablet 11  . warfarin (COUMADIN) 5 MG tablet Take 5 mg by mouth daily. Monday and Friday 15mg  Tues-Thursday,Saturday-Sunday 10mg      No current facility-administered medications on file prior to visit.    There are no Patient Instructions on file for this visit. No follow-ups on file.   Kris Hartmann, NP

## 2020-02-11 NOTE — Telephone Encounter (Signed)
Attempted to reach patient no answer could not leave vmail

## 2020-02-12 NOTE — Telephone Encounter (Signed)
Attempted again to reach patient no answer no vmail set up

## 2020-02-13 ENCOUNTER — Telehealth (INDEPENDENT_AMBULATORY_CARE_PROVIDER_SITE_OTHER): Payer: Self-pay | Admitting: Vascular Surgery

## 2020-02-13 NOTE — Telephone Encounter (Signed)
Forestine called from the [facility] in [place] she said that patient is clotting really bad. She mentioned that there was possible talk of a graft insertion and was wondering where we are as far as the plan of care. She also said that patient will be there for the next 4 hours if we could call back as soon as possible. Patient was last seen 02-08-20 with LLE Ven studies. Please advise.

## 2020-02-13 NOTE — Telephone Encounter (Signed)
Can we schedule [name] for a left forearm loop graft insertion with radiocephalic ligation with Dr. Delana Meyer

## 2020-02-13 NOTE — Telephone Encounter (Signed)
Forwarded to you per your request.

## 2020-02-14 ENCOUNTER — Encounter (INDEPENDENT_AMBULATORY_CARE_PROVIDER_SITE_OTHER): Payer: Self-pay

## 2020-02-14 NOTE — Telephone Encounter (Signed)
Spoke with the patient about surgery and he is now scheduled with Dr. Delana Meyer for a left forearm loop graft on 02/20/20. Patient will do a phone pre-op on 02/18/20 between 8-1 pm and covid testing on 02/19/20 between 8-10:30 pm at the [facility]. Pre-surgical instructions will be mailed.

## 2020-02-15 NOTE — Telephone Encounter (Signed)
Patient notified and labs order for [facility] to draw on 02-29-20. Patient verbalized understanding

## 2020-02-18 ENCOUNTER — Other Ambulatory Visit: Payer: Self-pay

## 2020-02-18 ENCOUNTER — Other Ambulatory Visit (INDEPENDENT_AMBULATORY_CARE_PROVIDER_SITE_OTHER): Payer: Self-pay | Admitting: Nurse Practitioner

## 2020-02-18 ENCOUNTER — Encounter
Admission: RE | Admit: 2020-02-18 | Discharge: 2020-02-18 | Disposition: A | Discharge status: Home / Self Care | Payer: Medicare Other | Source: Ambulatory Visit | Attending: Vascular Surgery | Admitting: Vascular Surgery

## 2020-02-18 ENCOUNTER — Other Ambulatory Visit
Admission: RE | Admit: 2020-02-18 | Discharge: 2020-02-18 | Disposition: A | Discharge status: Home / Self Care | Payer: Medicare Other | Source: Ambulatory Visit | Attending: Vascular Surgery | Admitting: Vascular Surgery

## 2020-02-18 DIAGNOSIS — Z01818 Encounter for other preprocedural examination: Secondary | ICD-10-CM | POA: Insufficient documentation

## 2020-02-18 DIAGNOSIS — Z20822 Contact with and (suspected) exposure to covid-19: Secondary | ICD-10-CM | POA: Diagnosis not present

## 2020-02-18 HISTORY — DX: Urinary tract infection, site not specified: N39.0

## 2020-02-18 HISTORY — DX: Obesity, unspecified: E66.9

## 2020-02-18 HISTORY — DX: Metabolic encephalopathy: G93.41

## 2020-02-18 HISTORY — DX: Pure hypercholesterolemia, unspecified: E78.00

## 2020-02-18 HISTORY — DX: Sepsis, unspecified organism: A41.9

## 2020-02-18 HISTORY — DX: End stage renal disease: N18.6

## 2020-02-18 HISTORY — DX: Respiratory failure, unspecified, unspecified whether with hypoxia or hypercapnia: J96.90

## 2020-02-18 HISTORY — DX: Hyperkalemia: E87.5

## 2020-02-18 HISTORY — DX: Anemia, unspecified: D64.9

## 2020-02-18 NOTE — Patient Instructions (Signed)
Your procedure is scheduled on: Wed. 6/23 Report to Day Surgery. To find out your arrival time please call [phone] between 1PM - 3PM on Tues 6/22.  Remember: Instructions that are not followed completely may result in serious medical risk,  up to and including death, or upon the discretion of your surgeon and anesthesiologist your  surgery may need to be rescheduled.     _X__ 1. Do not eat food after midnight the night before your procedure.                 No gum chewing or hard candies. You may drink clear liquids up to 2 hours                 before you are scheduled to arrive for your surgery- DO not drink clear                 liquids within 2 hours of the start of your surgery.                 Clear Liquids include:  water, G2 or                  Gatorade Zero (avoid Red/Purple/Blue), Black Coffee or Tea (Do not add                 anything to coffee or tea). _____2.   Complete the carbohydrate drink provided to you, 2 hours before arrival.  __X__2.  On the morning of surgery brush your teeth with toothpaste and water, you                may rinse your mouth with mouthwash if you wish.  Do not swallow any toothpaste of mouthwash.     _X__ 3.  No Alcohol for 24 hours before or after surgery.   _X__ 4.  Do Not Smoke or use e-cigarettes For 24 Hours Prior to Your Surgery.                 Do not use any chewable tobacco products for at least 6 hours prior to                 Surgery.  ___  5.  Do not use any recreational drugs (marijuana, cocaine, heroin, ecstacy, MDMA or other)                For at least one week prior to your surgery.  Combination of these drugs with anesthesia                May have life threatening results.  ____  6.  Bring all medications with you on the day of surgery if instructed.   __x__  7.  Notify your doctor if there is any change in your medical condition      (cold, fever, infections).     Do not wear jewelry,  Do not  wear lotions, powders,. Do not shave 48 hours prior to surgery. Men may shave face and neck. Do not bring valuables to the hospital.    [facility] is not responsible for any belongings or valuables.  Contacts, dentures or bridgework may not be worn into surgery. Leave your suitcase in the car. After surgery it may be brought to your room. For patients admitted to the hospital, discharge time is determined by your treatment team.   Patients discharged the day of surgery will not be allowed to drive home.  Make arrangements for someone to be with you for the first 24 hours of your Same Day Discharge.    Please read over the following fact sheets that you were given:     __x__ Take these medicines the morning of surgery with A SIP OF WATER:    1. allopurinol (ZYLOPRIM) 100 MG tablet  2. loratadine (CLARITIN) 10 MG tablet if needed  3.   4.  5.  6.  ____ Fleet Enema (as directed)   __x__ Use CHG Soap (or wipes) as directed  ____ Use Benzoyl Peroxide Gel as instructed  ____ Use inhalers on the day of surgery  ____ Stop metformin 2 days prior to surgery    __x__ Take 1/2 of usual insulin dose the night before surgery. No insulin the morning          of surgery.   __x__ Stopped Coumadin on Friday 6/18  __x__ Stop Anti-inflammatories ibuprofen (ADVIL) 800 MG tablet today   __x__ Stop supplements until after surgery.  CINNAMON PO,Ascorbic Acid (VITAMIN C WITH ROSE HIPS) 1000 MG tablet, melatonin 5 MG TABS,Omega-3 1000 MG CAPS ____ Bring C-Pap to the hospital.

## 2020-02-19 ENCOUNTER — Other Ambulatory Visit: Payer: Medicare Other

## 2020-02-19 ENCOUNTER — Encounter
Admission: RE | Admit: 2020-02-19 | Discharge: 2020-02-19 | Disposition: A | Discharge status: Home / Self Care | Payer: Medicare Other | Source: Ambulatory Visit | Attending: Vascular Surgery | Admitting: Vascular Surgery

## 2020-02-19 DIAGNOSIS — Z01818 Encounter for other preprocedural examination: Secondary | ICD-10-CM | POA: Insufficient documentation

## 2020-02-19 LAB — CBC WITH DIFFERENTIAL/PLATELET
Abs Immature Granulocytes: 0.03 10*3/uL (ref 0.00–0.07)
Basophils Absolute: 0 10*3/uL (ref 0.0–0.1)
Basophils Relative: 0 %
Eosinophils Absolute: 0.1 10*3/uL (ref 0.0–0.5)
Eosinophils Relative: 2 %
HCT: 36.1 % — ABNORMAL LOW (ref 39.0–52.0)
Hemoglobin: 12.1 g/dL — ABNORMAL LOW (ref 13.0–17.0)
Immature Granulocytes: 1 %
Lymphocytes Relative: 23 %
Lymphs Abs: 1.4 10*3/uL (ref 0.7–4.0)
MCH: 31.8 pg (ref 26.0–34.0)
MCHC: 33.5 g/dL (ref 30.0–36.0)
MCV: 95 fL (ref 80.0–100.0)
Monocytes Absolute: 0.5 10*3/uL (ref 0.1–1.0)
Monocytes Relative: 8 %
Neutro Abs: 4 10*3/uL (ref 1.7–7.7)
Neutrophils Relative %: 66 %
Platelets: 190 10*3/uL (ref 150–400)
RBC: 3.8 MIL/uL — ABNORMAL LOW (ref 4.22–5.81)
RDW: 14.2 % (ref 11.5–15.5)
WBC: 6 10*3/uL (ref 4.0–10.5)
nRBC: 0 % (ref 0.0–0.2)

## 2020-02-19 LAB — PROTIME-INR
INR: 1.4 — ABNORMAL HIGH (ref 0.8–1.2)
Prothrombin Time: 16.2 seconds — ABNORMAL HIGH (ref 11.4–15.2)

## 2020-02-19 LAB — TYPE AND SCREEN
ABO/RH(D): A POS
Antibody Screen: NEGATIVE

## 2020-02-19 LAB — BASIC METABOLIC PANEL
Anion gap: 11 (ref 5–15)
BUN: 23 mg/dL — ABNORMAL HIGH (ref 6–20)
CO2: 28 mmol/L (ref 22–32)
Calcium: 8.5 mg/dL — ABNORMAL LOW (ref 8.9–10.3)
Chloride: 98 mmol/L (ref 98–111)
Creatinine, Ser: 7.59 mg/dL — ABNORMAL HIGH (ref 0.61–1.24)
GFR calc Af Amer: 9 mL/min — ABNORMAL LOW (ref >60)
GFR calc non Af Amer: 8 mL/min — ABNORMAL LOW (ref >60)
Glucose, Bld: 230 mg/dL — ABNORMAL HIGH (ref 70–99)
Potassium: 3.2 mmol/L — ABNORMAL LOW (ref 3.5–5.1)
Sodium: 137 mmol/L (ref 135–145)

## 2020-02-19 LAB — SARS CORONAVIRUS 2 (TAT 6-24 HRS): SARS Coronavirus 2: NEGATIVE

## 2020-02-19 LAB — APTT: aPTT: 51 seconds — ABNORMAL HIGH (ref 24–36)

## 2020-02-20 ENCOUNTER — Ambulatory Visit: Payer: Medicare Other | Admitting: Anesthesiology

## 2020-02-20 ENCOUNTER — Encounter: Payer: Self-pay | Admitting: Vascular Surgery

## 2020-02-20 ENCOUNTER — Other Ambulatory Visit: Payer: Self-pay

## 2020-02-20 ENCOUNTER — Encounter
Admission: RE | Disposition: A | Discharge status: Home / Self Care | Payer: Self-pay | Source: Home / Self Care | Attending: Vascular Surgery

## 2020-02-20 ENCOUNTER — Ambulatory Visit
Admission: RE | Admit: 2020-02-20 | Discharge: 2020-02-20 | Disposition: A | Discharge status: Home / Self Care | Payer: Medicare Other | Attending: Vascular Surgery | Admitting: Vascular Surgery

## 2020-02-20 DIAGNOSIS — Z6841 Body Mass Index (BMI) 40.0 and over, adult: Secondary | ICD-10-CM | POA: Diagnosis not present

## 2020-02-20 DIAGNOSIS — I120 Hypertensive chronic kidney disease with stage 5 chronic kidney disease or end stage renal disease: Secondary | ICD-10-CM | POA: Insufficient documentation

## 2020-02-20 DIAGNOSIS — Z87891 Personal history of nicotine dependence: Secondary | ICD-10-CM | POA: Diagnosis not present

## 2020-02-20 DIAGNOSIS — T82590S Other mechanical complication of surgically created arteriovenous fistula, sequela: Secondary | ICD-10-CM | POA: Diagnosis not present

## 2020-02-20 DIAGNOSIS — Z992 Dependence on renal dialysis: Secondary | ICD-10-CM | POA: Diagnosis not present

## 2020-02-20 DIAGNOSIS — T82590A Other mechanical complication of surgically created arteriovenous fistula, initial encounter: Secondary | ICD-10-CM | POA: Diagnosis present

## 2020-02-20 DIAGNOSIS — Y832 Surgical operation with anastomosis, bypass or graft as the cause of abnormal reaction of the patient, or of later complication, without mention of misadventure at the time of the procedure: Secondary | ICD-10-CM | POA: Diagnosis not present

## 2020-02-20 DIAGNOSIS — T82898A Other specified complication of vascular prosthetic devices, implants and grafts, initial encounter: Secondary | ICD-10-CM | POA: Diagnosis not present

## 2020-02-20 DIAGNOSIS — N186 End stage renal disease: Secondary | ICD-10-CM | POA: Insufficient documentation

## 2020-02-20 DIAGNOSIS — Z888 Allergy status to other drugs, medicaments and biological substances status: Secondary | ICD-10-CM | POA: Insufficient documentation

## 2020-02-20 DIAGNOSIS — E1022 Type 1 diabetes mellitus with diabetic chronic kidney disease: Secondary | ICD-10-CM | POA: Insufficient documentation

## 2020-02-20 DIAGNOSIS — G4730 Sleep apnea, unspecified: Secondary | ICD-10-CM | POA: Diagnosis not present

## 2020-02-20 HISTORY — PX: AV FISTULA PLACEMENT: SHX1204

## 2020-02-20 LAB — POCT I-STAT, CHEM 8
BUN: 37 mg/dL — ABNORMAL HIGH (ref 6–20)
Calcium, Ion: 1.05 mmol/L — ABNORMAL LOW (ref 1.15–1.40)
Chloride: 98 mmol/L (ref 98–111)
Creatinine, Ser: 12 mg/dL — ABNORMAL HIGH (ref 0.61–1.24)
Glucose, Bld: 209 mg/dL — ABNORMAL HIGH (ref 70–99)
HCT: 41 % (ref 39.0–52.0)
Hemoglobin: 13.9 g/dL (ref 13.0–17.0)
Potassium: 3.6 mmol/L (ref 3.5–5.1)
Sodium: 140 mmol/L (ref 135–145)
TCO2: 28 mmol/L (ref 22–32)

## 2020-02-20 LAB — PROTIME-INR
INR: 1.2 (ref 0.8–1.2)
Prothrombin Time: 14.8 seconds (ref 11.4–15.2)

## 2020-02-20 LAB — GLUCOSE, CAPILLARY
Glucose-Capillary: 195 mg/dL — ABNORMAL HIGH (ref 70–99)
Glucose-Capillary: 193 mg/dL — ABNORMAL HIGH (ref 70–99)

## 2020-02-20 SURGERY — INSERTION OF ARTERIOVENOUS (AV) GORE-TEX GRAFT ARM
Anesthesia: General | Site: Arm Lower | Laterality: Left

## 2020-02-20 MED ORDER — FAMOTIDINE 20 MG PO TABS
ORAL_TABLET | ORAL | Status: AC
  Administered 2020-02-20: 20 mg via ORAL
  Filled 2020-02-20: qty 1

## 2020-02-20 MED ORDER — HEPARIN SODIUM (PORCINE) 5000 UNIT/ML IJ SOLN
INTRAMUSCULAR | Status: AC
  Filled 2020-02-20: qty 1

## 2020-02-20 MED ORDER — HYDROCODONE-ACETAMINOPHEN 5-325 MG PO TABS: 1.0000 | ORAL_TABLET | Freq: Four times a day (QID) | ORAL | 0 refills | Status: DC | PRN

## 2020-02-20 MED ORDER — CHLORHEXIDINE GLUCONATE CLOTH 2 % EX PADS: 6.0000 | MEDICATED_PAD | Freq: Once | CUTANEOUS | Status: DC

## 2020-02-20 MED ORDER — ROCURONIUM BROMIDE 10 MG/ML (PF) SYRINGE
PREFILLED_SYRINGE | INTRAVENOUS | Status: AC
  Filled 2020-02-20: qty 10

## 2020-02-20 MED ORDER — PAPAVERINE HCL 30 MG/ML IJ SOLN
INTRAMUSCULAR | Status: AC
  Filled 2020-02-20: qty 2

## 2020-02-20 MED ORDER — BUPIVACAINE LIPOSOME 1.3 % IJ SUSP
INTRAMUSCULAR | Status: AC
  Filled 2020-02-20: qty 20

## 2020-02-20 MED ORDER — SODIUM CHLORIDE 0.9 % IV SOLN
INTRAVENOUS | Status: DC | PRN
  Administered 2020-02-20: 500 mL via INTRAMUSCULAR

## 2020-02-20 MED ORDER — PHENYLEPHRINE HCL (PRESSORS) 10 MG/ML IV SOLN
INTRAVENOUS | Status: AC
  Filled 2020-02-20: qty 1

## 2020-02-20 MED ORDER — ONDANSETRON HCL 4 MG/2ML IJ SOLN
INTRAMUSCULAR | Status: AC
  Filled 2020-02-20: qty 2

## 2020-02-20 MED ORDER — LIDOCAINE HCL (PF) 2 % IJ SOLN
INTRAMUSCULAR | Status: AC
  Filled 2020-02-20: qty 5

## 2020-02-20 MED ORDER — SODIUM CHLORIDE 0.9 % IV SOLN: INTRAVENOUS | Status: DC

## 2020-02-20 MED ORDER — KETAMINE HCL 10 MG/ML IJ SOLN
INTRAMUSCULAR | Status: DC | PRN
Start: 2020-02-20 — End: 2020-02-20
  Administered 2020-02-20 (×2): 10 mg via INTRAVENOUS
  Administered 2020-02-20: 30 mg via INTRAVENOUS

## 2020-02-20 MED ORDER — ROCURONIUM BROMIDE 100 MG/10ML IV SOLN
INTRAVENOUS | Status: DC | PRN
  Administered 2020-02-20 (×2): 10 mg via INTRAVENOUS
  Administered 2020-02-20: 50 mg via INTRAVENOUS

## 2020-02-20 MED ORDER — FAMOTIDINE 20 MG PO TABS: 20.0000 mg | ORAL_TABLET | Freq: Once | ORAL | Status: AC

## 2020-02-20 MED ORDER — ACETAMINOPHEN 10 MG/ML IV SOLN
INTRAVENOUS | Status: DC | PRN
  Administered 2020-02-20: 1000 mg via INTRAVENOUS

## 2020-02-20 MED ORDER — GLYCOPYRROLATE 0.2 MG/ML IJ SOLN
INTRAMUSCULAR | Status: DC | PRN
  Administered 2020-02-20: .2 mg via INTRAVENOUS

## 2020-02-20 MED ORDER — OXYCODONE HCL 5 MG PO TABS: 5.0000 mg | ORAL_TABLET | Freq: Once | ORAL | Status: DC | PRN

## 2020-02-20 MED ORDER — MIDAZOLAM HCL 2 MG/2ML IJ SOLN
INTRAMUSCULAR | Status: AC
  Filled 2020-02-20: qty 2

## 2020-02-20 MED ORDER — KETAMINE HCL 50 MG/ML IJ SOLN
INTRAMUSCULAR | Status: AC
  Filled 2020-02-20: qty 10

## 2020-02-20 MED ORDER — ONDANSETRON HCL 4 MG/2ML IJ SOLN
INTRAMUSCULAR | Status: DC | PRN
  Administered 2020-02-20: 4 mg via INTRAVENOUS

## 2020-02-20 MED ORDER — FENTANYL CITRATE (PF) 100 MCG/2ML IJ SOLN
INTRAMUSCULAR | Status: AC
  Filled 2020-02-20: qty 2

## 2020-02-20 MED ORDER — SODIUM CHLORIDE (PF) 0.9 % IJ SOLN
INTRAMUSCULAR | Status: AC
  Filled 2020-02-20: qty 10

## 2020-02-20 MED ORDER — OXYCODONE HCL 5 MG/5ML PO SOLN: 5.0000 mg | Freq: Once | ORAL | Status: DC | PRN

## 2020-02-20 MED ORDER — ACETAMINOPHEN 10 MG/ML IV SOLN
INTRAVENOUS | Status: AC
  Filled 2020-02-20: qty 100

## 2020-02-20 MED ORDER — CHLORHEXIDINE GLUCONATE 0.12 % MT SOLN
OROMUCOSAL | Status: AC
  Administered 2020-02-20: 15 mL via OROMUCOSAL
  Filled 2020-02-20: qty 15

## 2020-02-20 MED ORDER — CHLORHEXIDINE GLUCONATE 0.12 % MT SOLN: 15.0000 mL | Freq: Once | OROMUCOSAL | Status: AC

## 2020-02-20 MED ORDER — FENTANYL CITRATE (PF) 100 MCG/2ML IJ SOLN
INTRAMUSCULAR | Status: DC | PRN
  Administered 2020-02-20: 12.5 ug via INTRAVENOUS
  Administered 2020-02-20: 25 ug via INTRAVENOUS
  Administered 2020-02-20: 12.5 ug via INTRAVENOUS

## 2020-02-20 MED ORDER — PHENYLEPHRINE HCL (PRESSORS) 10 MG/ML IV SOLN
INTRAVENOUS | Status: DC | PRN
  Administered 2020-02-20: 150 ug via INTRAVENOUS
  Administered 2020-02-20 (×2): 100 ug via INTRAVENOUS

## 2020-02-20 MED ORDER — DEXAMETHASONE SODIUM PHOSPHATE 10 MG/ML IJ SOLN
INTRAMUSCULAR | Status: DC | PRN
  Administered 2020-02-20: 8 mg via INTRAVENOUS

## 2020-02-20 MED ORDER — LIDOCAINE HCL (CARDIAC) PF 100 MG/5ML IV SOSY
PREFILLED_SYRINGE | INTRAVENOUS | Status: DC | PRN
  Administered 2020-02-20: 100 mg via INTRAVENOUS

## 2020-02-20 MED ORDER — SUGAMMADEX SODIUM 500 MG/5ML IV SOLN
INTRAVENOUS | Status: DC | PRN
  Administered 2020-02-20: 400 mg via INTRAVENOUS

## 2020-02-20 MED ORDER — BUPIVACAINE LIPOSOME 1.3 % IJ SUSP
INTRAMUSCULAR | Status: DC | PRN
  Administered 2020-02-20: 20 mL

## 2020-02-20 MED ORDER — BUPIVACAINE HCL (PF) 0.5 % IJ SOLN
INTRAMUSCULAR | Status: DC | PRN
  Administered 2020-02-20: 20 mL

## 2020-02-20 MED ORDER — BUPIVACAINE HCL (PF) 0.5 % IJ SOLN
INTRAMUSCULAR | Status: AC
  Filled 2020-02-20: qty 30

## 2020-02-20 MED ORDER — DEXAMETHASONE SODIUM PHOSPHATE 10 MG/ML IJ SOLN
INTRAMUSCULAR | Status: AC
  Filled 2020-02-20: qty 1

## 2020-02-20 MED ORDER — DEXMEDETOMIDINE HCL 200 MCG/2ML IV SOLN
INTRAVENOUS | Status: DC | PRN
  Administered 2020-02-20: 8 ug via INTRAVENOUS
  Administered 2020-02-20: 12 ug via INTRAVENOUS

## 2020-02-20 MED ORDER — MIDAZOLAM HCL 2 MG/2ML IJ SOLN
INTRAMUSCULAR | Status: DC | PRN
  Administered 2020-02-20: 2 mg via INTRAVENOUS

## 2020-02-20 MED ORDER — ORAL CARE MOUTH RINSE: 15.0000 mL | Freq: Once | OROMUCOSAL | Status: AC

## 2020-02-20 MED ORDER — PROPOFOL 500 MG/50ML IV EMUL
INTRAVENOUS | Status: AC
  Filled 2020-02-20: qty 50

## 2020-02-20 MED ORDER — HEMOSTATIC AGENTS (NO CHARGE) OPTIME
TOPICAL | Status: DC | PRN
  Administered 2020-02-20: 1 via TOPICAL

## 2020-02-20 MED ORDER — SUCCINYLCHOLINE CHLORIDE 20 MG/ML IJ SOLN
INTRAMUSCULAR | Status: DC | PRN
  Administered 2020-02-20: 180 mg via INTRAVENOUS

## 2020-02-20 MED ORDER — CEFAZOLIN SODIUM-DEXTROSE 1-4 GM/50ML-% IV SOLN
1.0000 g | INTRAVENOUS | Status: AC
  Administered 2020-02-20 (×2): 1 g via INTRAVENOUS

## 2020-02-20 MED ORDER — PROPOFOL 10 MG/ML IV BOLUS
INTRAVENOUS | Status: DC | PRN
  Administered 2020-02-20: 250 mg via INTRAVENOUS

## 2020-02-20 MED ORDER — CEFAZOLIN SODIUM-DEXTROSE 1-4 GM/50ML-% IV SOLN
INTRAVENOUS | Status: AC
  Filled 2020-02-20: qty 50

## 2020-02-20 MED ORDER — DEXMEDETOMIDINE HCL IN NACL 80 MCG/20ML IV SOLN
INTRAVENOUS | Status: AC
  Filled 2020-02-20: qty 20

## 2020-02-20 MED ORDER — GLYCOPYRROLATE 0.2 MG/ML IJ SOLN
INTRAMUSCULAR | Status: AC
  Filled 2020-02-20: qty 1

## 2020-02-20 MED ORDER — FENTANYL CITRATE (PF) 100 MCG/2ML IJ SOLN: 25.0000 ug | INTRAMUSCULAR | Status: DC | PRN

## 2020-02-20 SURGICAL SUPPLY — 55 items
BAG COUNTER SPONGE EZ (MISCELLANEOUS) IMPLANT
BAG DECANTER FOR FLEXI CONT (MISCELLANEOUS) ×3 IMPLANT
BLADE SURG SZ11 CARB STEEL (BLADE) ×3 IMPLANT
BOOT SUTURE AID YELLOW STND (SUTURE) ×3 IMPLANT
BRUSH SCRUB EZ  4% CHG (MISCELLANEOUS) ×2
BRUSH SCRUB EZ 4% CHG (MISCELLANEOUS) ×1 IMPLANT
CANISTER SUCT 1200ML W/VALVE (MISCELLANEOUS) ×3 IMPLANT
CHLORAPREP W/TINT 26 (MISCELLANEOUS) ×3 IMPLANT
COUNTER SPONGE BAG EZ (MISCELLANEOUS)
COVER WAND RF STERILE (DRAPES) ×3 IMPLANT
DERMABOND ADVANCED (GAUZE/BANDAGES/DRESSINGS) ×2
DERMABOND ADVANCED .7 DNX12 (GAUZE/BANDAGES/DRESSINGS) ×1 IMPLANT
DRESSING SURGICEL FIBRLLR 1X2 (HEMOSTASIS) ×1 IMPLANT
DRSG SURGICEL FIBRILLAR 1X2 (HEMOSTASIS) ×3
ELECT CAUTERY BLADE 6.4 (BLADE) ×3 IMPLANT
ELECT REM PT RETURN 9FT ADLT (ELECTROSURGICAL) ×3
ELECTRODE REM PT RTRN 9FT ADLT (ELECTROSURGICAL) ×1 IMPLANT
GLOVE BIO SURGEON STRL SZ7 (GLOVE) ×3 IMPLANT
GLOVE INDICATOR 7.5 STRL GRN (GLOVE) ×3 IMPLANT
GLOVE SURG SYN 8.0 (GLOVE) ×3 IMPLANT
GOWN STRL REUS W/ TWL LRG LVL3 (GOWN DISPOSABLE) ×2 IMPLANT
GOWN STRL REUS W/ TWL XL LVL3 (GOWN DISPOSABLE) ×2 IMPLANT
GOWN STRL REUS W/TWL LRG LVL3 (GOWN DISPOSABLE) ×4
GOWN STRL REUS W/TWL XL LVL3 (GOWN DISPOSABLE) ×4
GRAFT PROPATEN STD WALL 4 7X45 (Vascular Products) ×3 IMPLANT
IV NS 500ML (IV SOLUTION) ×2
IV NS 500ML BAXH (IV SOLUTION) ×1 IMPLANT
KIT TURNOVER KIT A (KITS) ×3 IMPLANT
LABEL OR SOLS (LABEL) ×3 IMPLANT
LOOP RED MAXI  1X406MM (MISCELLANEOUS) ×2
LOOP VESSEL MAXI 1X406 RED (MISCELLANEOUS) ×1 IMPLANT
LOOP VESSEL MINI 0.8X406 BLUE (MISCELLANEOUS) ×2 IMPLANT
LOOPS BLUE MINI 0.8X406MM (MISCELLANEOUS) ×4
NEEDLE FILTER BLUNT 18X 1/2SAF (NEEDLE) ×2
NEEDLE FILTER BLUNT 18X1 1/2 (NEEDLE) ×1 IMPLANT
NS IRRIG 500ML POUR BTL (IV SOLUTION) ×3 IMPLANT
PACK EXTREMITY (MISCELLANEOUS) ×3 IMPLANT
PAD PREP 24X41 OB/GYN DISP (PERSONAL CARE ITEMS) IMPLANT
STOCKINETTE STRL 4IN 9604848 (GAUZE/BANDAGES/DRESSINGS) ×3 IMPLANT
SUT GTX CV-5 TTC13 DBL (SUTURE) ×6 IMPLANT
SUT GTX CV-6 30 (SUTURE) ×6 IMPLANT
SUT MNCRL+ 5-0 UNDYED PC-3 (SUTURE) ×1 IMPLANT
SUT MONOCRYL 5-0 (SUTURE) ×2
SUT PROLENE 6 0 BV (SUTURE) ×6 IMPLANT
SUT SILK 2 0 (SUTURE) ×2
SUT SILK 2 0 SH (SUTURE) ×3 IMPLANT
SUT SILK 2-0 18XBRD TIE 12 (SUTURE) ×1 IMPLANT
SUT SILK 3 0 (SUTURE) ×2
SUT SILK 3-0 18XBRD TIE 12 (SUTURE) ×1 IMPLANT
SUT SILK 4 0 (SUTURE) ×2
SUT SILK 4-0 18XBRD TIE 12 (SUTURE) ×1 IMPLANT
SUT VIC AB 3-0 SH 27 (SUTURE) ×4
SUT VIC AB 3-0 SH 27X BRD (SUTURE) ×2 IMPLANT
SYR 20ML LL LF (SYRINGE) ×3 IMPLANT
SYR 3ML LL SCALE MARK (SYRINGE) ×3 IMPLANT

## 2020-02-20 NOTE — Transfer of Care (Signed)
Immediate Anesthesia Transfer of Care Note  Patient: [name]  Procedure(s) Performed: INSERTION OF ARTERIOVENOUS (AV) GORE-TEX GRAFT LEFT ARM ( FOREARM LOOP ) (Left Arm Lower)  Patient Location: PACU  Anesthesia Type:General  Level of Consciousness: drowsy  Airway & Oxygen Therapy: Patient Spontanous Breathing and Patient connected to face mask oxygen  Post-op Assessment: Report given to RN and Post -op Vital signs reviewed and stable  Post vital signs: Reviewed and stable  Last Vitals:  Vitals Value Taken Time  BP 129/64 02/20/20 1021  Temp 36 C 02/20/20 1020  Pulse 85 02/20/20 1023  Resp 20 02/20/20 1023  SpO2 98 % 02/20/20 1023  Vitals shown include unvalidated device data.  Last Pain:  Vitals:   02/20/20 1020  TempSrc:   PainSc: Asleep         Complications: No complications documented.

## 2020-02-20 NOTE — Discharge Instructions (Signed)
AMBULATORY SURGERY  °DISCHARGE INSTRUCTIONS ° ° °1) The drugs that you were given will stay in your system until tomorrow so for the next 24 hours you should not: ° °A) Drive an automobile °B) Make any legal decisions °C) Drink any alcoholic beverage ° ° °2) You may resume regular meals tomorrow.  Today it is better to start with liquids and gradually work up to solid foods. ° °You may eat anything you prefer, but it is better to start with liquids, then soup and crackers, and gradually work up to solid foods. ° ° °3) Please notify your doctor immediately if you have any unusual bleeding, trouble breathing, redness and pain at the surgery site, drainage, fever, or pain not relieved by medication. ° ° ° °4) Additional Instructions: ° ° ° ° ° ° ° °Please contact your physician with any problems or Same Day Surgery at [phone], Monday through Friday 6 am to 4 pm, or [place] [facility] [place] Main number at [phone]. °

## 2020-02-20 NOTE — H&P (Signed)
[facility] History & Physical Update  The patient was interviewed and re-examined.  The patient's previous History and Physical has been reviewed and is unchanged.  There is no change in the plan of care. We plan to proceed with the scheduled procedure.  [name], MD  02/20/2020, 7:25 AM

## 2020-02-20 NOTE — Anesthesia Preprocedure Evaluation (Signed)
Anesthesia Evaluation  Patient identified by MRN, date of birth, ID band Patient awake    Reviewed: Allergy & Precautions, H&P , NPO status , Patient's Chart, lab work & pertinent test results  History of Anesthesia Complications Negative for: history of anesthetic complications  Airway Mallampati: III  TM Distance: >3 FB Neck ROM: full    Dental  (+) Chipped, Poor Dentition, Missing   Pulmonary shortness of breath, sleep apnea , former smoker,    Pulmonary exam normal        Cardiovascular hypertension, (-) angina+ Orthopnea  (-) Past MI Normal cardiovascular exam+ dysrhythmias      Neuro/Psych negative neurological ROS  negative psych ROS   GI/Hepatic negative GI ROS, Neg liver ROS, neg GERD  ,  Endo/Other  diabetes, Type 2, Insulin Dependent  Renal/GU Renal disease     Musculoskeletal   Abdominal   Peds  Hematology negative hematology ROS (+)   Anesthesia Other Findings Past Medical History: No date: Anemia No date: Chronic kidney disease     Comment:  14% FUNCTION No date: Diabetes mellitus without complication (HCC) No date: Dyspnea     Comment:  DOE No date: Dysrhythmia No date: End stage renal disease (HCC) No date: History of orthopnea     Comment:  error wrong patietn No date: Hypercholesteremia     Comment:  error No date: Hyperkalemia     Comment:  error wrong patient No date: Hypertension No date: Lymphedema of leg No date: Metabolic encephalopathy     Comment:  error No date: Microalbuminuria No date: Obesity No date: Respiratory failure (HCC)     Comment:  Error No date: Sepsis ([place])     Comment:  error No date: Sleep apnea     Comment:  no CPAP No date: UTI (urinary tract infection)     Comment:  error No date: Vitamin D deficiency  Past Surgical History: 10/24/2018: A/V FISTULAGRAM; Right     Comment:  Procedure: A/V FISTULAGRAM;  Surgeon: Katha Cabal, MD;   Location: [facility];  Service:               Cardiovascular;  Laterality: Right; 11/24/2018: A/V FISTULAGRAM; Right     Comment:  Procedure: A/V FISTULAGRAM;  Surgeon: Katha Cabal, MD;  Location: [place] [facility];  Service:               Cardiovascular;  Laterality: Right; 07/31/2019: A/V FISTULAGRAM; Left     Comment:  Procedure: A/V FISTULAGRAM;  Surgeon: Katha Cabal, MD;  Location: [facility];  Service:               Cardiovascular;  Laterality: Left; 12/11/2019: A/V FISTULAGRAM; Left     Comment:  Procedure: A/V FISTULAGRAM;  Surgeon: Katha Cabal, MD;  Location: [facility];  Service:               Cardiovascular;  Laterality: Left; 02/21/2019: A/V SHUNT INTERVENTION; Right     Comment:  Procedure: A/V SHUNT INTERVENTION;  Surgeon: Katha Cabal, MD;  Location: [facility];  Service:              Cardiovascular;  Laterality: Right; 0/01/3015: APPLICATION OF WOUND VAC; Left     Comment:  Procedure: APPLICATION OF WOUND VAC;  Surgeon: Algernon Huxley, MD;  Location: [facility];  Service: General;                Laterality: Left; 0/05/9322: APPLICATION OF WOUND VAC; Left     Comment:  Procedure: APPLICATION OF WOUND VAC;  Surgeon: Katha Cabal, MD;  Location: [facility];  Service: Vascular;                Laterality: Left; 5/57/3220: APPLICATION OF WOUND VAC; Left     Comment:  Procedure: WOUND VAC CHANGE;  Surgeon: Katha Cabal, MD;  Location: [facility];  Service: Vascular;                Laterality: Left;  left lower leg 2/54/2706: APPLICATION OF WOUND VAC; Left     Comment:  Procedure: WOUND VAC CHANGE;  Surgeon: Katha Cabal, MD;  Location: [facility];  Service: Vascular;                Laterality: Left; 10/02/7626: APPLICATION OF WOUND VAC; Left     Comment:  Procedure: APPLICATION OF WOUND VAC;   Surgeon: Katha Cabal, MD;  Location: [facility];  Service: Vascular;                Laterality: Left; 07/19/2018: AV FISTULA INSERTION W/ RF MAGNETIC GUIDANCE; Right     Comment:  Procedure: AV FISTULA INSERTION W/RF MAGNETIC GUIDANCE;               Surgeon: Katha Cabal, MD;  Location: [place]              [facility];  Service: Cardiovascular;  Laterality: Right; 05/11/2019: AV FISTULA PLACEMENT; Left     Comment:  Procedure: ARTERIOVENOUS (AV) FISTULA CREATION               (RADIOCEPHALIC );  Surgeon: Katha Cabal, MD;                Location: [facility];  Service: Vascular;  Laterality:               Left; 11/01/2018: CATARACT EXTRACTION W/PHACO; Left     Comment:  Procedure: CATARACT EXTRACTION PHACO AND INTRAOCULAR               LENS PLACEMENT (IOC)-LEFT, DIABETIC-INSULIN DEPENDENT;                Surgeon: Eulogio Bear, MD;  Location: [facility];                Service: Ophthalmology;  Laterality: Left;  [place]               00:41.8 CDE 4.61 Fluid Pack Lot # [id number] 04/27/2019: CATARACT EXTRACTION W/PHACO; Right     Comment:  Procedure: CATARACT EXTRACTION PHACO AND INTRAOCULAR  LENS PLACEMENT (IOC);  Surgeon: Eulogio Bear, MD;                Location: [facility];  Service: Ophthalmology;  Laterality:              Right;  [place] 00:34 CDE 1.97 FLUID PACK LOT # [id number]  02/23/2019: DIALYSIS/PERMA CATHETER INSERTION; N/A     Comment:  Procedure: DIALYSIS/PERMA CATHETER INSERTION;  Surgeon:               Algernon Huxley, MD;  Location: [place] [facility];                Service: Cardiovascular;  Laterality: N/A; 10/11/2017: I & D EXTREMITY; Left     Comment:  Procedure: IRRIGATION AND DEBRIDEMENT EXTREMITY;                Surgeon: Katha Cabal, MD;  Location: [facility];                Service: Vascular;  Laterality: Left; 10/07/2017: INCISION AND DRAINAGE ABSCESS; Right     Comment:  Procedure: INCISION AND DRAINAGE ABSCESS;  Surgeon:                Katha Cabal, MD;  Location: [facility];  Service:               Vascular;  Laterality: Right; 10/03/2017: IRRIGATION AND DEBRIDEMENT ABSCESS; Left     Comment:  Procedure: IRRIGATION AND [place] with               debridement of skin, soft tissue, muscle 50sq cm;                Surgeon: Algernon Huxley, MD;  Location: [facility];  Service:              General;  Laterality: Left; 02/20/2019: TEMPORARY DIALYSIS CATHETER     Comment:  Procedure: TEMPORARY DIALYSIS CATHETER;  Surgeon:               Katha Cabal, MD;  Location: [place] [facility];               Service: Cardiovascular;; 09/18/2019: UPPER EXTREMITY ANGIOGRAPHY; Left     Comment:  Procedure: UPPER EXTREMITY ANGIOGRAPHY;  Surgeon:               Katha Cabal, MD;  Location: [place] [facility];               Service: Cardiovascular;  Laterality: Left;  BMI    Body Mass Index: 47.47 kg/m      Reproductive/Obstetrics negative OB ROS                             Anesthesia Physical Anesthesia Plan  ASA: IV  Anesthesia Plan: General ETT   Post-op Pain Management:    Induction: Intravenous  PONV Risk Score and Plan: Ondansetron, Dexamethasone, Midazolam and Treatment may vary due to age or medical condition  Airway Management Planned: Oral ETT and Video Laryngoscope Planned  Additional Equipment:   Intra-op Plan:   Post-operative Plan: Extubation in OR  Informed Consent: I have reviewed the patients History and Physical, chart, labs and discussed the procedure including the risks, benefits and alternatives for the proposed anesthesia with the patient or authorized representative who has indicated his/her understanding and acceptance.     Dental Advisory Given  Plan Discussed with: Anesthesiologist, CRNA and Surgeon  Anesthesia Plan Comments: (Patient consented for risks of anesthesia including but not limited to:  - adverse reactions to  medications - damage to eyes, teeth, lips or other oral mucosa - nerve damage due to positioning  - sore throat or hoarseness - Damage to heart, brain, nerves, lungs, other parts of body or loss of life  Patient voiced understanding.)        Anesthesia Quick Evaluation

## 2020-02-20 NOTE — Op Note (Signed)
OPERATIVE NOTE   PROCEDURE: Left forearm loop arteriovenous graft with 4 to 7 mm tapered propatent graft  PRE-OPERATIVE DIAGNOSIS: 1. ESRD 2.  Complication of dialysis access with failure to mature of fistula  POST-OPERATIVE DIAGNOSIS: same as above  SURGEON: Katha Cabal, MD  ASSISTANT(S): None  ANESTHESIA: General  ESTIMATED BLOOD LOSS: 25 cc  FINDING(S): 1. none  SPECIMEN(S):  none  INDICATIONS:   [name] is a 49 y.o. male who  presents with ESRD and need for permanent dialysis access.  Risk, benefits, and alternatives to access surgery were discussed.  The difference between AV fistulas and AV grafts were discussed.  The patient is aware the risks include but are not limited to: bleeding, infection, steal syndrome, nerve damage, ischemic monomelic neuropathy, failure to mature, and need for additional procedures.  The patient is aware of the risks and elects to proceed forward.  DESCRIPTION: After full informed written consent was obtained from the patient, the patient was brought back to the operating room and placed supine upon the operating table.  The patientwas given IV antibiotics prior to proceeding.  After obtaining adequate general anesthesia, the patient was prepped and draped in standard fashion.    I made a transverse incision at the antecubital fossa and dissected down through the subcutaneous tissue and fascia.  The brachial artery was dissected proximally and distally and vessel loops placed for control.  The artery was patent and adequate sized for AV graft creation.  One of the brachial veins was in close proximity and was found to be patent and of adequate size for AV graft creation.  I dissected it out and placed a vessel loop around the vein, and would later control this with bulldog clamps.  I then obtained a 4 to 7 mm tapered propatent graft, which I stretched to full length to help determine the apex of this looped forearm arteriovenous graft.  I  made a transverse incision at the determined apex site and dissected down out a pocket.  I then took a curved metal tunneler and dissected from the antecubital incision up to the apical incision.  I delivered the graft through the metal tunnel, taking care to maintain the orientation of the graft.  I then tunneled from the apex to the antecubital incision, leaving the metal tunnel in place and delivered the remainder of the graft through the tunnel.   I then gave the patient 3000 units of heparin to gain some anticoagulation, and allowed this to circulate for several minutes. I placed the brachial artery under tension proximally and distally with vessel loops.  I made an arteriotomy with a 11 blade and extended it with a Potts scissor for.  The graft was then cut and beveled to match the arteriotomy.  The graft was sewn to the artery with a running CV-6 Goretex suture, in a end-to-side configuration.  Prior to completing the anastomosis, I allowed the artery to backbleed from both ends.  I then  released the vessel loops on the inflow and allowed the artery to decompress through the graft. There was good pulsatile bleeding through this graft.  I then clamped the graft near its arterial anastomosis.  I then suctioned out all the blood in the graft and instilled heparinized saline into the graft.   At this point, I pulled the graft to appropriate tension to remove any redundancy.  I then used the bulldog clamps to control the vein.  An anterior wall venotomy was then made  with an 11 blade and extended with Potts scissors.  The graft was then cut an beveled to match the venotomy.  The venous anastomosis was created with a CV-6 suture. Prior to completing this anastomosis, I allowed the vein to back bleed and then I also allowed the artery to bleed in an antegrade fashion.  I completed this anastomosis in the usual fashion, and irrigated out the wound with sterile saline.  I released all clamps.  There was excellent  flow in the graft, and a palpable pulse in the artery beyond the graft. At this point, I washed out the antecubital wound.  I placed Surgicel and Evicel topical hemostatic agents and hemostasis was complete. The subcutaneous tissue was reapproximated in all incisions with a running stitch of 3-0 Vicryl.  The skin was then reapproximated in all incisions with a running subcuticular 4-0 Monocryl.  The skin was then cleaned and dried at all incisions, and Dermabond used to reinforce the skin closure.  The patient was taken to the recovery room in stable condition having tolerated the procedure well.  COMPLICATIONS: none  CONDITION: stable  [name]  02/20/2020, 10:17 AM                  This note was created with Dragon Medical transcription system. Any errors in dictation are purely unintentional.

## 2020-02-20 NOTE — Anesthesia Procedure Notes (Signed)
Procedure Name: Intubation Date/Time: 02/20/2020 7:40 AM Performed by: Lia Foyer, CRNA Pre-anesthesia Checklist: Patient identified, Emergency Drugs available, Suction available and Patient being monitored Patient Re-evaluated:Patient Re-evaluated prior to induction Oxygen Delivery Method: Circle system utilized Preoxygenation: Pre-oxygenation with 100% oxygen Induction Type: IV induction Ventilation: Mask ventilation without difficulty Laryngoscope Size: McGraph and 4 Grade View: Grade II Tube type: Oral Tube size: 8.0 mm Number of attempts: 1 Airway Equipment and Method: Stylet,  Oral airway and Video-laryngoscopy Placement Confirmation: ETT inserted through vocal cords under direct vision,  positive ETCO2 and breath sounds checked- equal and bilateral Secured at: 24 cm Tube secured with: Tape Dental Injury: Teeth and Oropharynx as per pre-operative assessment

## 2020-02-20 NOTE — Anesthesia Postprocedure Evaluation (Signed)
Anesthesia Post Note  Patient: [name]  Procedure(s) Performed: INSERTION OF ARTERIOVENOUS (AV) GORE-TEX GRAFT LEFT ARM ( FOREARM LOOP ) (Left Arm Lower)  Patient location during evaluation: PACU Anesthesia Type: General Level of consciousness: awake and alert Pain management: pain level controlled Vital Signs Assessment: post-procedure vital signs reviewed and stable Respiratory status: spontaneous breathing, nonlabored ventilation, respiratory function stable and patient connected to nasal cannula oxygen Cardiovascular status: blood pressure returned to baseline and stable Postop Assessment: no apparent nausea or vomiting Anesthetic complications: no   No complications documented.   Last Vitals:  Vitals:   02/20/20 1116 02/20/20 1152  BP: 132/68 139/76  Pulse: 70 69  Resp: 12 16  Temp: (!) 36.4 C   SpO2: 96% 96%    Last Pain:  Vitals:   02/20/20 1152  TempSrc:   PainSc: 0-No pain                 Precious Haws Daphna Lafuente

## 2020-02-21 ENCOUNTER — Encounter: Payer: Self-pay | Admitting: Vascular Surgery

## 2020-02-21 NOTE — Progress Notes (Signed)
Patient suggests [place] getting an XL stretcher, wheelchair and recliner as he didn't fit comfortably in any of our things

## 2020-02-29 ENCOUNTER — Other Ambulatory Visit
Admission: RE | Admit: 2020-02-29 | Discharge: 2020-02-29 | Disposition: A | Discharge status: Home / Self Care | Payer: Medicare Other | Attending: Urology | Admitting: Urology

## 2020-02-29 ENCOUNTER — Other Ambulatory Visit: Payer: Self-pay

## 2020-02-29 DIAGNOSIS — R7989 Other specified abnormal findings of blood chemistry: Secondary | ICD-10-CM | POA: Insufficient documentation

## 2020-03-01 LAB — TESTOSTERONE: Testosterone: 176 ng/dL — ABNORMAL LOW (ref 264–916)

## 2020-03-01 LAB — LUTEINIZING HORMONE: LH: 3.8 m[IU]/mL (ref 1.7–8.6)

## 2020-03-04 ENCOUNTER — Other Ambulatory Visit
Admission: RE | Admit: 2020-03-04 | Discharge: 2020-03-04 | Disposition: A | Discharge status: Home / Self Care | Payer: Medicare Other | Attending: Urology | Admitting: Urology

## 2020-03-04 ENCOUNTER — Other Ambulatory Visit: Payer: Self-pay

## 2020-03-04 ENCOUNTER — Other Ambulatory Visit (INDEPENDENT_AMBULATORY_CARE_PROVIDER_SITE_OTHER): Payer: Self-pay | Admitting: Vascular Surgery

## 2020-03-04 ENCOUNTER — Encounter: Payer: Self-pay | Admitting: Urology

## 2020-03-04 ENCOUNTER — Ambulatory Visit (INDEPENDENT_AMBULATORY_CARE_PROVIDER_SITE_OTHER): Payer: Medicare Other | Admitting: Urology

## 2020-03-04 VITALS — BP 171/77 | HR 89 | Ht 76.0 in | Wt 392.0 lb

## 2020-03-04 DIAGNOSIS — Z125 Encounter for screening for malignant neoplasm of prostate: Secondary | ICD-10-CM | POA: Diagnosis not present

## 2020-03-04 DIAGNOSIS — Z95828 Presence of other vascular implants and grafts: Secondary | ICD-10-CM

## 2020-03-04 DIAGNOSIS — R972 Elevated prostate specific antigen [PSA]: Secondary | ICD-10-CM

## 2020-03-04 DIAGNOSIS — R7989 Other specified abnormal findings of blood chemistry: Secondary | ICD-10-CM

## 2020-03-04 DIAGNOSIS — N186 End stage renal disease: Secondary | ICD-10-CM

## 2020-03-04 DIAGNOSIS — N529 Male erectile dysfunction, unspecified: Secondary | ICD-10-CM | POA: Diagnosis not present

## 2020-03-04 LAB — PSA: Prostatic Specific Antigen: 0.57 ng/mL (ref 0.00–4.00)

## 2020-03-04 MED ORDER — TESTOSTERONE 50 MG/5GM (1%) TD GEL
5.0000 g | Freq: Every day | TRANSDERMAL | 5 refills | Status: DC
Start: 2020-03-04 — End: 2020-03-31

## 2020-03-04 NOTE — Progress Notes (Signed)
   03/04/2020 12:45 PM   [name] 09-26-1970 [id number]  Reason for visit: Follow up ED, low T  HPI: I had follow-up with [name] today regarding the above issues.  To briefly summarize, he is a very comorbid 49 year old African-American male with history notable for morbid obesity and BMI of 48, ESRD on dialysis, and poorly controlled diabetes.  At our last visit we had tried Cialis 10 mg daily for his ED, and he reports he has had some mild improvement, but continues to have very bothersome ED preventing sexual intercourse with his partner.  We checked testosterone levels which were both low at 228 and 176.  LH was normal.  PSA is pending.  I recommended increasing the Cialis to 20 mg on demand or daily, as well as considering testosterone replacement with AndroGel.  We discussed other options for testosterone replacement, and he would like to start with a gel form.  PSA today and will call with results prior to starting testosterone replacement.  We discussed risks and benefits at length of testosterone, and the possible improvement in his erections.  Finally, we again reviewed behavioral strategies including weight loss and improve diabetes control regarding his erections.  Cialis 20 mg for ED Call with PSA results Testosterone replacement with AndroGel RTC with PA in 4 months with testosterone level and symptom check  Billey Co, MD  [facility] [address] [place], [place] [address] [phone]

## 2020-03-04 NOTE — Patient Instructions (Signed)
Testosterone Replacement Therapy  Testosterone replacement therapy (TRT) is used to treat men who have a low testosterone level (hypogonadism). Testosterone is a male hormone that is produced in the testicles. It is responsible for typically male characteristics and for maintaining a man's sex drive and the ability to get an erection. Testosterone also supports bone and muscle health. TRT can be a gel, liquid, or patch that you put on your skin. It can also be in the form of a tablet or an injection. In some cases, your health care provider may insert long-acting pellets under your skin. In most men, the level of testosterone starts to decline gradually after age 58. Low testosterone can also be caused by certain medical conditions, medicines, and obesity. Your health care provider can diagnose hypogonadism with at least two blood tests that are done early in the morning. Low testosterone may not need to be treated. TRT is usually a choice that you make with your health care provider. Your health care provider may recommend TRT if you have low testosterone that is causing symptoms, such as:  Low sex drive.  Erection problems.  Breast enlargement.  Loss of body hair.  Weak muscles or bones.  Shrinking testicles.  Increased body fat.  Low energy.  Hot flashes.  Depression.  Decreased work Systems analyst. TRT is a lifetime treatment. If you stop treatment, your testosterone will drop, and your symptoms may return. What are the risks? Testosterone replacement therapy may have side effects, including:  Lower sperm count.  Skin irritation at the application or injection site.  Mouth irritation if you take an oral tablet.  Acne.  Swelling of your legs or feet.  Tender breasts.  Dizziness.  Sleep disturbance.  Mood swings.  Possible increased risk of stroke or heart attack. Testosterone replacement therapy may also increase your risk for prostate cancer or male breast cancer.  You should not use TRT if you have either of those conditions. Your health care provider also may not recommend TRT if:  You are suspected of having prostate cancer.  You want to father a child.  You have a high number of red blood cells.  You have untreated sleep apnea.  You have a very large prostate. Supplies needed:  Your health care provider will prescribe the testosterone gel, solution, or medicine that you need. If your health care provider teaches you to do self-injections at home, you will also need: ? Your medicine vial. ? Disposable needles and syringes. ? Alcohol swabs. ? A needle disposal container. ? Adhesive bandages. How to use testosterone replacement therapy Your health care provider will help you find the TRT option that will work best for you based on your preference, the side effects, and the cost. You may:  Rub testosterone gel on your upper arm or shoulder every day after a shower. This is the most common type of TRT. Do not let women or children come in contact with the gel.  Apply a testosterone solution under your arms once each day.  Place a testosterone patch on your skin once each day.  Dissolve a testosterone tablet in your mouth twice each day.  Have a testosterone pellet inserted under your skin by your health care provider. This will be replaced every 3-6 months.  Use testosterone nasal spray three times each day.  Get testosterone injections. For some types of testosterone, your health care provider will give you this injection. With other types of testosterone, you may be taught to give injections to  yourself. The frequency of injections may vary based on the type of testosterone that you receive. Follow these instructions at home:  Take over-the-counter and prescription medicines only as told by your health care provider.  Lose weight if you are overweight. Ask your health care provider to help you start a healthy diet and exercise program to  reach and maintain a healthy weight.  Work with your health care provider to treat other medical conditions that may lower your testosterone. These include obesity, high blood pressure, high cholesterol, diabetes, liver disease, kidney disease, and sleep apnea.  Keep all follow-up visits as told by your health care provider. This is important. General recommendations  Discuss all risks and benefits with your health care provider before starting therapy.  Work with your health care provider to check your prostate health and do blood testing before you start therapy.  Do not use any testosterone replacement therapies that are not prescribed by your health care provider or not approved for use in the [place]  Do not use TRT for bodybuilding or to improve sexual performance. TRT should be used only to treat symptoms of low testosterone.  Return for all repeat prostate checks and blood tests during therapy, as told by your health care provider. Where to find more information Learn more about testosterone replacement therapy from:  [place]: www.urologyhealth.org/urologic-conditions/low-testosterone-(hypogonadism)  Endocrine Society: www.hormone.org/diseases-and-conditions/mens-health/hypogonadism Contact a health care provider if:  You have side effects from your testosterone replacement therapy.  You continue to have symptoms of low testosterone during treatment.  You develop new symptoms during treatment. Summary  Testosterone replacement therapy is only for men who have low testosterone as determined by blood testing and who have symptoms of low testosterone.  Testosterone replacement therapy should be prescribed only by a health care provider and should be used under the supervision of a health care provider.  You may not be able to take testosterone if you have certain medical conditions, including prostate cancer, male breast cancer, or heart  disease.  Testosterone replacement therapy may have side effects and may make some medical conditions worse.  Talk with your health care provider about all the risks and benefits before you start therapy. This information is not intended to replace advice given to you by your health care provider. Make sure you discuss any questions you have with your health care provider. Document Revised: 08/29/2016 Document Reviewed: 05/06/2016 Elsevier Patient Education  [place].  Testosterone skin gel What is this medicine? TESTOSTERONE (tes TOS ter one) is the main male hormone. It supports normal male traits such as muscle growth, facial hair, and deep voice. This gel is used in males to treat low testosterone levels. This medicine may be used for other purposes; ask your health care provider or pharmacist if you have questions. COMMON BRAND NAME(S): AndroGel, FORTESTA, Testim, Vogelxo What should I tell my health care provider before I take this medicine? They need to know if you have any of these conditions:  breast cancer  diabetes  heart disease  if a male partner is pregnant or trying to get pregnant  kidney disease  liver disease  lung disease  prostate cancer, enlargement  an unusual or allergic reaction to testosterone, soy proteins, other medicines, foods, dyes, or preservatives  pregnant or trying to get pregnant  breast-feeding How should I use this medicine? This medicine is for external use only. This medicine is applied at the same time every day (preferably in the morning) to clean,  dry, intact skin. If you take a bath or shower in the morning, apply the gel after the bath or shower. Follow the directions on the prescription label. Make sure that you are using your testosterone gel product correctly and applying it only to the appropriate skin area (see below). Allow the skin to dry a few minutes then cover with clothing to prevent others from coming in  contact with the medicine on your skin. The gel is flammable. Avoid fire, flame, or smoking until the gel has dried. Wash your hands with soap and water after use. For AndroGel 1% Packets: Open the packet(s) needed for your dose. You can put the entire dose into your palm all at once or just a little at a time to apply. If you prefer, you can instead squeeze the gel directly onto the area you are applying it to. Apply on the shoulders, upper arm, or abdomen as directed. Do not apply to the scrotum or genitals. Be sure you use the correct total dose. It is best to wait 5 to 6 hours after application of the gel before showering or swimming. For AndroGel 1%: Pump the dose into the palm of your hand. You can put the entire dose into your palm all at once or just a little at a time to apply. If you prefer, you can instead pump the gel directly onto the area you are applying it to. Apply on the shoulders, upper arm, or abdomen as directed. Do not apply to the scrotum or genitals. Be sure you use the correct total dose. It is best to wait for 5 to 6 hours after application of the gel before showering or swimming. For Androgel 1.62% packets: Open the packet(s) needed for your dose. You can put the entire dose into your palm all at once or just a little at a time to apply. If you prefer, you can instead squeeze the gel directly onto the area you are applying it to. Apply on the shoulders and upper arms as directed. Do not apply to other parts of the body including the abdomen, genitals, chest, armpits, or knees. Be sure you use the correct total dose. It is best to wait 2 hours after application of the gel before washing, showering, or swimming. For AndroGel 1.62%: Pump the dose into the palm of your hand. Dispense one pump of gel at a time into the palm of your hand before applying it. If you prefer, you can instead pump the gel directly onto the area you are applying it to. Apply on the shoulders and upper arms as  directed. Do not apply to other parts of the body including the abdomen, genitals, chest, armpits, or knees. Be sure you use the correct total dose. It is best to wait 2 hours after application of the gel before washing, showering, or swimming. For Testim: Open the tube(s) needed for your dose. Squeeze the gel from the tube into the palm of your hand. Apply on the shoulders or upper arms as directed. Do not apply to the scrotum, genitals, or abdomen. Be sure you use the correct total dose. Do not shower or swim for at least 2 hours after application of the gel. For Fortesta: Use the multi-dose pump to pump the gel directly onto the area you are applying it to. Apply on the thighs as directed. Do not apply to the abdomen, penis, scrotum, shoulders or upper arms. Gently rub the gel onto the skin using your finger. Be sure  you use the correct total dose. Do not shower or swim for at least 2 hours after application of the gel. A special MedGuide will be given to you by the pharmacist with each prescription and refill. Be sure to read this information carefully each time. Talk to your pediatrician regarding the use of this medicine in children. Special care may be needed. Overdosage: If you think you have taken too much of this medicine contact a poison control center or emergency room at once. NOTE: This medicine is only for you. Do not share this medicine with others. What if I miss a dose? If you miss a dose, use it as soon as you can. If it is almost time for your next dose, use only that dose. Do not use double or extra doses. What may interact with this medicine?  medicines for diabetes  medicines that treat or prevent blood clots like warfarin  oxyphenbutazone  propranolol  steroid medicines like prednisone or cortisone This list may not describe all possible interactions. Give your health care provider a list of all the medicines, herbs, non-prescription drugs, or dietary supplements you use.  Also tell them if you smoke, drink alcohol, or use illegal drugs. Some items may interact with your medicine. What should I watch for while using this medicine? Visit your doctor or health care professional for regular checks on your progress. They will need to check the level of testosterone in your blood. This medicine is only approved for use in men who have low levels of testosterone related to certain medical conditions. Heart attacks and strokes have been reported with the use of this medicine. Notify your doctor or health care professional and seek emergency treatment if you develop breathing problems; changes in vision; confusion; chest pain or chest tightness; sudden arm pain; severe, sudden headache; trouble speaking or understanding; sudden numbness or weakness of the face, arm or leg; loss of balance or coordination. Talk to your doctor about the risks and benefits of this medicine. This medicine can transfer from your body to others. If a person or pet comes in contact with the area where this medicine was applied to your skin, they may have a serious risk of side effects. If you cannot avoid skin-to-skin contact with another person, make sure the site where this medicine was applied is covered with clothing. If accidental contact happens, the skin of the person or pet should be washed right away with soap and water. Also, a male partner who is pregnant or trying to get pregnant should avoid contact with the gel or treated skin. This medicine may affect blood sugar levels. If you have diabetes, check with your doctor or health care professional before you change your diet or the dose of your diabetic medicine. This drug is banned from use in athletes by most athletic organizations. What side effects may I notice from receiving this medicine? Side effects that you should report to your doctor or health care professional as soon as possible:  allergic reactions like skin rash, itching or  hives, swelling of the face, lips, or tongue  breast enlargement  breathing problems  changes in mood, especially anger, depression, or rage  dark urine  general ill feeling or flu-like symptoms  light-colored stools  loss of appetite, nausea  nausea, vomiting  right upper belly pain  stomach pain  swelling of ankles  too frequent or persistent erections  trouble passing urine or change in the amount of urine  unusually weak or  tired  yellowing of the eyes or skin Side effects that usually do not require medical attention (report to your doctor or health care professional if they continue or are bothersome):  acne  change in sex drive or performance  hair loss  headache This list may not describe all possible side effects. Call your doctor for medical advice about side effects. You may report side effects to FDA at 1-800-FDA-1088. Where should I keep my medicine? Keep out of the reach of children. This medicine can be abused. Keep your medicine in a safe place to protect it from theft. Do not share this medicine with anyone. Selling or giving away this medicine is dangerous and against the law. Store at room temperature between 15 to 30 degrees C (59 to 86 degrees F). Keep closed until use. Protect from heat and light. This medicine is flammable. Avoid exposure to heat, fire, flame, and smoking. Throw away any unused medicine after the expiration date. NOTE: This sheet is a summary. It may not cover all possible information. If you have questions about this medicine, talk to your doctor, pharmacist, or health care provider.  2020 Elsevier/Gold Standard (2013-11-01 08:27:26)

## 2020-03-05 ENCOUNTER — Telehealth: Payer: Self-pay

## 2020-03-05 NOTE — Telephone Encounter (Signed)
-----   Message from Billey Co, MD sent at 03/05/2020  8:06 AM EDT ----- PSA is normal, ok to start testosterone replacement gel as prescribed  Thanks [name], MD 03/05/2020

## 2020-03-05 NOTE — Telephone Encounter (Signed)
Called pt no answer. Unable to leave voicemail as it is full. 1st attempt.

## 2020-03-06 ENCOUNTER — Encounter (INDEPENDENT_AMBULATORY_CARE_PROVIDER_SITE_OTHER): Payer: Self-pay | Admitting: Vascular Surgery

## 2020-03-06 ENCOUNTER — Ambulatory Visit (INDEPENDENT_AMBULATORY_CARE_PROVIDER_SITE_OTHER): Payer: Medicare Other

## 2020-03-06 ENCOUNTER — Other Ambulatory Visit: Payer: Self-pay

## 2020-03-06 ENCOUNTER — Ambulatory Visit (INDEPENDENT_AMBULATORY_CARE_PROVIDER_SITE_OTHER): Payer: Medicare Other | Admitting: Vascular Surgery

## 2020-03-06 VITALS — BP 129/74 | HR 75 | Ht 76.0 in | Wt 389.0 lb

## 2020-03-06 DIAGNOSIS — Z95828 Presence of other vascular implants and grafts: Secondary | ICD-10-CM

## 2020-03-06 DIAGNOSIS — T829XXS Unspecified complication of cardiac and vascular prosthetic device, implant and graft, sequela: Secondary | ICD-10-CM

## 2020-03-06 DIAGNOSIS — Z9889 Other specified postprocedural states: Secondary | ICD-10-CM | POA: Diagnosis not present

## 2020-03-06 DIAGNOSIS — N186 End stage renal disease: Secondary | ICD-10-CM | POA: Diagnosis not present

## 2020-03-06 NOTE — H&P (View-Only) (Signed)
Patient ID: [name], male   DOB: 02-09-1971, 49 y.o.   MRN: [id number]  Chief Complaint  Patient presents with  . Follow-up    [facility] post insertion of AV gore TEX graft left Arm    HPI [name] is a 49 y.o. male.    We are asked to see the patient sooner than expected for evaluation of swelling of the left arm  He states the pain is well controled and decreasing  HDA shows good flow with flow volume of 980 cc/min   Past Medical History:  Diagnosis Date  . Anemia   . Chronic kidney disease    14% FUNCTION  . Diabetes mellitus without complication ([place])   . Dyspnea    DOE  . Dysrhythmia   . End stage renal disease ([place])   . History of orthopnea    error wrong patietn  . Hypercholesteremia    error  . Hyperkalemia    error wrong patient  . Hypertension   . Lymphedema of leg   . Metabolic encephalopathy    error  . Microalbuminuria   . Obesity   . Respiratory failure ([place])    Error  . Sepsis ([place])    error  . Sleep apnea    no CPAP  . UTI (urinary tract infection)    error  . Vitamin D deficiency     Past Surgical History:  Procedure Laterality Date  . A/V FISTULAGRAM Right 10/24/2018   Procedure: A/V FISTULAGRAM;  Surgeon: Katha Cabal, MD;  Location: [facility];  Service: Cardiovascular;  Laterality: Right;  . A/V FISTULAGRAM Right 11/24/2018   Procedure: A/V FISTULAGRAM;  Surgeon: Katha Cabal, MD;  Location: [facility];  Service: Cardiovascular;  Laterality: Right;  . A/V FISTULAGRAM Left 07/31/2019   Procedure: A/V FISTULAGRAM;  Surgeon: Katha Cabal, MD;  Location: [facility];  Service: Cardiovascular;  Laterality: Left;  . A/V FISTULAGRAM Left 12/11/2019   Procedure: A/V FISTULAGRAM;  Surgeon: Katha Cabal, MD;  Location: [facility];  Service: Cardiovascular;  Laterality: Left;  . A/V SHUNT INTERVENTION Right 02/21/2019   Procedure: A/V SHUNT INTERVENTION;  Surgeon: Katha Cabal, MD;  Location: [facility];  Service: Cardiovascular;  Laterality: Right;  . APPLICATION OF WOUND VAC Left 10/03/2017   Procedure: APPLICATION OF WOUND VAC;  Surgeon: Algernon Huxley, MD;  Location: [facility];  Service: General;  Laterality: Left;  . APPLICATION OF WOUND VAC Left 10/11/2017   Procedure: APPLICATION OF WOUND VAC;  Surgeon: Katha Cabal, MD;  Location: [facility];  Service: Vascular;  Laterality: Left;  . APPLICATION OF WOUND VAC Left 10/14/2017   Procedure: WOUND VAC CHANGE;  Surgeon: Katha Cabal, MD;  Location: [facility];  Service: Vascular;  Laterality: Left;  left lower leg  . APPLICATION OF WOUND VAC Left 10/18/2017   Procedure: WOUND VAC CHANGE;  Surgeon: Katha Cabal, MD;  Location: [facility];  Service: Vascular;  Laterality: Left;  . APPLICATION OF WOUND VAC Left 10/07/2017   Procedure: APPLICATION OF WOUND VAC;  Surgeon: Katha Cabal, MD;  Location: [facility];  Service: Vascular;  Laterality: Left;  . AV FISTULA INSERTION W/ RF MAGNETIC GUIDANCE Right 07/19/2018   Procedure: AV FISTULA INSERTION W/RF MAGNETIC GUIDANCE;  Surgeon: Katha Cabal, MD;  Location: [facility];  Service: Cardiovascular;  Laterality: Right;  . AV FISTULA PLACEMENT Left 05/11/2019  Procedure: ARTERIOVENOUS (AV) FISTULA CREATION (RADIOCEPHALIC );  Surgeon: Katha Cabal, MD;  Location: [facility];  Service: Vascular;  Laterality: Left;  . AV FISTULA PLACEMENT Left 02/20/2020   Procedure: INSERTION OF ARTERIOVENOUS (AV) GORE-TEX GRAFT LEFT ARM ( FOREARM LOOP );  Surgeon: Katha Cabal, MD;  Location: [facility];  Service: Vascular;  Laterality: Left;  . CATARACT EXTRACTION W/PHACO Left 11/01/2018   Procedure: CATARACT EXTRACTION PHACO AND INTRAOCULAR LENS PLACEMENT (IOC)-LEFT, DIABETIC-INSULIN DEPENDENT;  Surgeon: Eulogio Bear, MD;  Location: [facility];  Service: Ophthalmology;  Laterality: Left;  [place] 00:41.8 CDE 4.61 Fluid Pack Lot # [id number]  .  CATARACT EXTRACTION W/PHACO Right 04/27/2019   Procedure: CATARACT EXTRACTION PHACO AND INTRAOCULAR LENS PLACEMENT (IOC);  Surgeon: Eulogio Bear, MD;  Location: [facility];  Service: Ophthalmology;  Laterality: Right;  [place] 00:34 CDE 1.97 FLUID PACK LOT # [id number]   . DIALYSIS/PERMA CATHETER INSERTION N/A 02/23/2019   Procedure: DIALYSIS/PERMA CATHETER INSERTION;  Surgeon: Algernon Huxley, MD;  Location: [facility];  Service: Cardiovascular;  Laterality: N/A;  . I & D EXTREMITY Left 10/11/2017   Procedure: IRRIGATION AND DEBRIDEMENT EXTREMITY;  Surgeon: Katha Cabal, MD;  Location: [facility];  Service: Vascular;  Laterality: Left;  . INCISION AND DRAINAGE ABSCESS Right 10/07/2017   Procedure: INCISION AND DRAINAGE ABSCESS;  Surgeon: Katha Cabal, MD;  Location: [facility];  Service: Vascular;  Laterality: Right;  . IRRIGATION AND DEBRIDEMENT ABSCESS Left 10/03/2017   Procedure: IRRIGATION AND DEBRIDEMENT ABSCESS with debridement of skin, soft tissue, muscle 50sq cm;  Surgeon: Algernon Huxley, MD;  Location: [facility];  Service: General;  Laterality: Left;  . TEMPORARY DIALYSIS CATHETER  02/20/2019   Procedure: TEMPORARY DIALYSIS CATHETER;  Surgeon: Katha Cabal, MD;  Location: [facility];  Service: Cardiovascular;;  . UPPER EXTREMITY ANGIOGRAPHY Left 09/18/2019   Procedure: UPPER EXTREMITY ANGIOGRAPHY;  Surgeon: Katha Cabal, MD;  Location: [facility];  Service: Cardiovascular;  Laterality: Left;      Allergies  Allergen Reactions  . Lisinopril Swelling    Lips mouth   . Furosemide Cough    Light headed, blurry vision, weakness.  . Adhesive [Tape] Itching    Current Outpatient Medications  Medication Sig Dispense Refill  . allopurinol (ZYLOPRIM) 100 MG tablet Take 100 mg by mouth daily.    [name] amLODipine (NORVASC) 10 MG tablet Take 10 mg by mouth once a week.     . Ascorbic Acid (VITAMIN C WITH ROSE HIPS) 1000 MG tablet Take 1,000 mg by mouth once  a week.     [name] atenolol (TENORMIN) 50 MG tablet Take 50 mg by mouth once a week.     . B Complex-C-Folic Acid (RENA-VITE RX) 1 MG TABS Take 1 tablet by mouth daily.    . calcium acetate (PHOSLO) 667 MG capsule Take 667-2,001 mg by mouth See admin instructions. Take 2001 mg by mouth with each meal & take 1334 mg by mouth with each snack.    . cinacalcet (SENSIPAR) 30 MG tablet Take 30 mg by mouth daily.    [name] CINNAMON PO Take 1,000 mg by mouth daily.     [name] HYDROcodone-acetaminophen (NORCO) 5-325 MG tablet Take 1-2 tablets by mouth every 6 (six) hours as needed for moderate pain or severe pain. 50 tablet 0  . ibuprofen (ADVIL) 800 MG tablet Take 800 mg by mouth every 8 (eight) hours as needed for moderate pain.     [name]  Insulin Glargine (LANTUS SOLOSTAR) 100 UNIT/ML Solostar Pen Inject 10 Units into the skin at bedtime.     [name] loratadine (CLARITIN) 10 MG tablet Take 10 mg by mouth daily as needed for allergies.    . melatonin 5 MG TABS Take 5 mg by mouth at bedtime.     . metolazone (ZAROXOLYN) 5 MG tablet Take 5 mg by mouth every Monday, Wednesday, and Friday.     . Multiple Vitamin (MULTIVITAMIN WITH MINERALS) TABS tablet Take 1 tablet by mouth every Tuesday, Thursday, and Saturday at 6 PM. Men's Multivitamin    . Multiple Vitamins-Minerals (EYE HEALTH PO) Take 1 capsule by mouth daily.     . Omega-3 1000 MG CAPS Take 1,000 mg by mouth every other day.     . tadalafil (CIALIS) 10 MG tablet Take 1 tablet (10 mg total) by mouth daily as needed for erectile dysfunction. 30 tablet 11  . testosterone (ANDROGEL) 50 MG/5GM (1%) GEL Place 5 g onto the skin daily. 50 g 5  . warfarin (COUMADIN) 5 MG tablet Take 5 mg by mouth See admin instructions. Take 15 mg by mouth on Monday and Friday and take 10 mg on Tues-Thursday,Saturday-Sunday     No current facility-administered medications for this visit.        Physical Exam BP 129/74   Pulse 75   Ht 6\' 4"  (1.93 m)   Wt (!) 389 lb (176.4 kg)   BMI 47.35  kg/m  Gen:  WD/WN, NAD Skin: incision C/D/I, no evidence of infection and good thrill and good bruit; moderate edema     Assessment/Plan: 1. Complication of vascular access for dialysis, sequela Good access.  I will see him back in a couple weeks and hope to start dialysis late July     [name] 03/06/2020, 12:55 PM   This note was created with Dragon medical transcription system.  Any errors from dictation are unintentional.

## 2020-03-06 NOTE — Telephone Encounter (Signed)
2nd attempt, VM not set up

## 2020-03-06 NOTE — Progress Notes (Signed)
Patient ID: [name], male   DOB: [birth year], 49 y.o.   MRN: [id number]  Chief Complaint  Patient presents with  . Follow-up    [facility] post insertion of AV gore TEX graft left Arm    HPI [name] is a 49 y.o. male.    We are asked to see the patient sooner than expected for evaluation of swelling of the left arm  He states the pain is well controled and decreasing  HDA shows good flow with flow volume of 980 cc/min   Past Medical History:  Diagnosis Date  . Anemia   . Chronic kidney disease    14% FUNCTION  . Diabetes mellitus without complication ([place])   . Dyspnea    DOE  . Dysrhythmia   . End stage renal disease ([place])   . History of orthopnea    error wrong patietn  . Hypercholesteremia    error  . Hyperkalemia    error wrong patient  . Hypertension   . Lymphedema of leg   . Metabolic encephalopathy    error  . Microalbuminuria   . Obesity   . Respiratory failure ([place])    Error  . Sepsis ([place])    error  . Sleep apnea    no CPAP  . UTI (urinary tract infection)    error  . Vitamin D deficiency     Past Surgical History:  Procedure Laterality Date  . A/V FISTULAGRAM Right 10/24/2018   Procedure: A/V FISTULAGRAM;  Surgeon: Katha Cabal, MD;  Location: [place] [facility];  Service: Cardiovascular;  Laterality: Right;  . A/V FISTULAGRAM Right 11/24/2018   Procedure: A/V FISTULAGRAM;  Surgeon: Katha Cabal, MD;  Location: [place] [facility];  Service: Cardiovascular;  Laterality: Right;  . A/V FISTULAGRAM Left 07/31/2019   Procedure: A/V FISTULAGRAM;  Surgeon: Katha Cabal, MD;  Location: [place] [facility];  Service: Cardiovascular;  Laterality: Left;  . A/V FISTULAGRAM Left 12/11/2019   Procedure: A/V FISTULAGRAM;  Surgeon: Katha Cabal, MD;  Location: [place] [facility];  Service: Cardiovascular;  Laterality: Left;  . A/V SHUNT INTERVENTION Right 02/21/2019   Procedure: A/V SHUNT INTERVENTION;  Surgeon: Katha Cabal, MD;  Location: [place] [facility];  Service: Cardiovascular;  Laterality: Right;  . APPLICATION OF WOUND VAC Left 10/03/2017   Procedure: APPLICATION OF WOUND VAC;  Surgeon: Algernon Huxley, MD;  Location: [facility];  Service: General;  Laterality: Left;  . APPLICATION OF WOUND VAC Left 10/11/2017   Procedure: APPLICATION OF WOUND VAC;  Surgeon: Katha Cabal, MD;  Location: [facility];  Service: Vascular;  Laterality: Left;  . APPLICATION OF WOUND VAC Left 10/14/2017   Procedure: WOUND VAC CHANGE;  Surgeon: Katha Cabal, MD;  Location: [facility];  Service: Vascular;  Laterality: Left;  left lower leg  . APPLICATION OF WOUND VAC Left 10/18/2017   Procedure: WOUND VAC CHANGE;  Surgeon: Katha Cabal, MD;  Location: [facility];  Service: Vascular;  Laterality: Left;  . APPLICATION OF WOUND VAC Left 10/07/2017   Procedure: APPLICATION OF WOUND VAC;  Surgeon: Katha Cabal, MD;  Location: [facility];  Service: Vascular;  Laterality: Left;  . AV FISTULA INSERTION W/ RF MAGNETIC GUIDANCE Right 07/19/2018   Procedure: AV FISTULA INSERTION W/RF MAGNETIC GUIDANCE;  Surgeon: Katha Cabal, MD;  Location: [place] [facility];  Service: Cardiovascular;  Laterality: Right;  . AV FISTULA PLACEMENT Left 05/11/2019  Procedure: ARTERIOVENOUS (AV) FISTULA CREATION (RADIOCEPHALIC );  Surgeon: Katha Cabal, MD;  Location: [facility];  Service: Vascular;  Laterality: Left;  . AV FISTULA PLACEMENT Left 02/20/2020   Procedure: INSERTION OF ARTERIOVENOUS (AV) GORE-TEX GRAFT LEFT ARM ( FOREARM LOOP );  Surgeon: Katha Cabal, MD;  Location: [facility];  Service: Vascular;  Laterality: Left;  . CATARACT EXTRACTION W/PHACO Left 11/01/2018   Procedure: CATARACT EXTRACTION PHACO AND INTRAOCULAR LENS PLACEMENT (IOC)-LEFT, DIABETIC-INSULIN DEPENDENT;  Surgeon: Eulogio Bear, MD;  Location: [facility];  Service: Ophthalmology;  Laterality: Left;  [place] 00:41.8 CDE 4.61 Fluid Pack Lot # [id number]  .  CATARACT EXTRACTION W/PHACO Right 04/27/2019   Procedure: CATARACT EXTRACTION PHACO AND INTRAOCULAR LENS PLACEMENT (IOC);  Surgeon: Eulogio Bear, MD;  Location: [facility];  Service: Ophthalmology;  Laterality: Right;  [place] 00:34 CDE 1.97 FLUID PACK LOT # [id number]   . DIALYSIS/PERMA CATHETER INSERTION N/A 02/23/2019   Procedure: DIALYSIS/PERMA CATHETER INSERTION;  Surgeon: Algernon Huxley, MD;  Location: [place] [facility];  Service: Cardiovascular;  Laterality: N/A;  . I & D EXTREMITY Left 10/11/2017   Procedure: IRRIGATION AND DEBRIDEMENT EXTREMITY;  Surgeon: Katha Cabal, MD;  Location: [facility];  Service: Vascular;  Laterality: Left;  . INCISION AND DRAINAGE ABSCESS Right 10/07/2017   Procedure: INCISION AND DRAINAGE ABSCESS;  Surgeon: Katha Cabal, MD;  Location: [facility];  Service: Vascular;  Laterality: Right;  . IRRIGATION AND DEBRIDEMENT ABSCESS Left 10/03/2017   Procedure: IRRIGATION AND DEBRIDEMENT ABSCESS with debridement of skin, soft tissue, muscle 50sq cm;  Surgeon: Algernon Huxley, MD;  Location: [facility];  Service: General;  Laterality: Left;  . TEMPORARY DIALYSIS CATHETER  02/20/2019   Procedure: TEMPORARY DIALYSIS CATHETER;  Surgeon: Katha Cabal, MD;  Location: [place] [facility];  Service: Cardiovascular;;  . UPPER EXTREMITY ANGIOGRAPHY Left 09/18/2019   Procedure: UPPER EXTREMITY ANGIOGRAPHY;  Surgeon: Katha Cabal, MD;  Location: [place] [facility];  Service: Cardiovascular;  Laterality: Left;      Allergies  Allergen Reactions  . Lisinopril Swelling    Lips mouth   . Furosemide Cough    Light headed, blurry vision, weakness.  . Adhesive [Tape] Itching    Current Outpatient Medications  Medication Sig Dispense Refill  . allopurinol (ZYLOPRIM) 100 MG tablet Take 100 mg by mouth daily.    [name] amLODipine (NORVASC) 10 MG tablet Take 10 mg by mouth once a week.     . Ascorbic Acid (VITAMIN C WITH ROSE HIPS) 1000 MG tablet Take 1,000 mg by mouth once  a week.     [name] atenolol (TENORMIN) 50 MG tablet Take 50 mg by mouth once a week.     . B Complex-C-Folic Acid (RENA-VITE RX) 1 MG TABS Take 1 tablet by mouth daily.    . calcium acetate (PHOSLO) 667 MG capsule Take 667-2,001 mg by mouth See admin instructions. Take 2001 mg by mouth with each meal & take 1334 mg by mouth with each snack.    . cinacalcet (SENSIPAR) 30 MG tablet Take 30 mg by mouth daily.    [name] CINNAMON PO Take 1,000 mg by mouth daily.     [name] HYDROcodone-acetaminophen (NORCO) 5-325 MG tablet Take 1-2 tablets by mouth every 6 (six) hours as needed for moderate pain or severe pain. 50 tablet 0  . ibuprofen (ADVIL) 800 MG tablet Take 800 mg by mouth every 8 (eight) hours as needed for moderate pain.     [name]  Insulin Glargine (LANTUS SOLOSTAR) 100 UNIT/ML Solostar Pen Inject 10 Units into the skin at bedtime.     [name] loratadine (CLARITIN) 10 MG tablet Take 10 mg by mouth daily as needed for allergies.    . melatonin 5 MG TABS Take 5 mg by mouth at bedtime.     . metolazone (ZAROXOLYN) 5 MG tablet Take 5 mg by mouth every Monday, Wednesday, and Friday.     . Multiple Vitamin (MULTIVITAMIN WITH MINERALS) TABS tablet Take 1 tablet by mouth every Tuesday, Thursday, and Saturday at 6 PM. Men's Multivitamin    . Multiple Vitamins-Minerals (EYE HEALTH PO) Take 1 capsule by mouth daily.     . Omega-3 1000 MG CAPS Take 1,000 mg by mouth every other day.     . tadalafil (CIALIS) 10 MG tablet Take 1 tablet (10 mg total) by mouth daily as needed for erectile dysfunction. 30 tablet 11  . testosterone (ANDROGEL) 50 MG/5GM (1%) GEL Place 5 g onto the skin daily. 50 g 5  . warfarin (COUMADIN) 5 MG tablet Take 5 mg by mouth See admin instructions. Take 15 mg by mouth on Monday and Friday and take 10 mg on Tues-Thursday,Saturday-Sunday     No current facility-administered medications for this visit.        Physical Exam BP 129/74   Pulse 75   Ht 6\' 4"  (1.93 m)   Wt (!) 389 lb (176.4 kg)   BMI 47.35  kg/m  Gen:  WD/WN, NAD Skin: incision C/D/I, no evidence of infection and good thrill and good bruit; moderate edema     Assessment/Plan: 1. Complication of vascular access for dialysis, sequela Good access.  I will see him back in a couple weeks and hope to start dialysis late July     Broxton Broady 03/06/2020, 12:55 PM   This note was created with Dragon medical transcription system.  Any errors from dictation are unintentional.

## 2020-03-10 NOTE — Telephone Encounter (Signed)
Tried calling pt and his VM has not been set up yet. Will try again later

## 2020-03-10 NOTE — Telephone Encounter (Signed)
The generic AndroGel is $38.24 at [facility].   Would that be doable for [name]?  The injections are contraindicated with renal failure.

## 2020-03-10 NOTE — Telephone Encounter (Signed)
[name]- can you help with this? Thanks  [name], MD 03/10/2020

## 2020-03-10 NOTE — Telephone Encounter (Signed)
Spoke with patient and he states the Androgel is $362.00 and he can't afford it. He will call around to different pharmacies to see if it's cheaper than [facility]. He also asked is there anything different he can use? Please advise

## 2020-03-11 ENCOUNTER — Encounter (INDEPENDENT_AMBULATORY_CARE_PROVIDER_SITE_OTHER): Payer: Medicare Other

## 2020-03-11 ENCOUNTER — Ambulatory Visit (INDEPENDENT_AMBULATORY_CARE_PROVIDER_SITE_OTHER): Payer: Medicare Other | Admitting: Nurse Practitioner

## 2020-03-13 NOTE — Telephone Encounter (Signed)
Spoke with patient and he will go to [facility] to pick up.

## 2020-03-14 ENCOUNTER — Encounter (INDEPENDENT_AMBULATORY_CARE_PROVIDER_SITE_OTHER): Payer: Medicare Other

## 2020-03-14 ENCOUNTER — Ambulatory Visit (INDEPENDENT_AMBULATORY_CARE_PROVIDER_SITE_OTHER): Payer: Medicare Other | Admitting: Nurse Practitioner

## 2020-03-18 ENCOUNTER — Telehealth: Payer: Self-pay | Admitting: Urology

## 2020-03-18 NOTE — Telephone Encounter (Signed)
Pt left a voice mail stating our office said they would send his prescription for testosterone to [facility] where he can get the medication cheaper. He has checked with [facility] multiple times and they have not received a prescription. He would like for someone to check into this and call him back with an update.

## 2020-03-19 ENCOUNTER — Other Ambulatory Visit: Payer: Self-pay | Admitting: Urology

## 2020-03-19 DIAGNOSIS — R7989 Other specified abnormal findings of blood chemistry: Secondary | ICD-10-CM

## 2020-03-19 MED ORDER — TESTOSTERONE 20.25 MG/ACT (1.62%) TD GEL: 2.0000 | Freq: Every day | TRANSDERMAL | 1 refills | Status: DC

## 2020-03-19 NOTE — Telephone Encounter (Signed)
[name], can you send the generic androgel to [facility] for him? Thx  [name], MD 03/19/2020

## 2020-03-19 NOTE — Progress Notes (Signed)
Patient ID: [name], male   DOB: 06/17/1971, 49 y.o.   MRN: [id number]  No chief complaint on file.   HPI [name] is a 49 y.o. male.    Patient returns for follow up evaluation of swelling of the left arm s/p forearm loop graft  He states the pain is well controled and decreasing  HDA shows good flow with flow volume of 980 cc/min  Past Medical History:  Diagnosis Date  . Anemia   . Chronic kidney disease    14% FUNCTION  . Diabetes mellitus without complication ([place])   . Dyspnea    DOE  . Dysrhythmia   . End stage renal disease ([place])   . History of orthopnea    error wrong patietn  . Hypercholesteremia    error  . Hyperkalemia    error wrong patient  . Hypertension   . Lymphedema of leg   . Metabolic encephalopathy    error  . Microalbuminuria   . Obesity   . Respiratory failure ([place])    Error  . Sepsis ([place])    error  . Sleep apnea    no CPAP  . UTI (urinary tract infection)    error  . Vitamin D deficiency     Past Surgical History:  Procedure Laterality Date  . A/V FISTULAGRAM Right 10/24/2018   Procedure: A/V FISTULAGRAM;  Surgeon: Katha Cabal, MD;  Location: [place] [facility];  Service: Cardiovascular;  Laterality: Right;  . A/V FISTULAGRAM Right 11/24/2018   Procedure: A/V FISTULAGRAM;  Surgeon: Katha Cabal, MD;  Location: [place] [facility];  Service: Cardiovascular;  Laterality: Right;  . A/V FISTULAGRAM Left 07/31/2019   Procedure: A/V FISTULAGRAM;  Surgeon: Katha Cabal, MD;  Location: [place] [facility];  Service: Cardiovascular;  Laterality: Left;  . A/V FISTULAGRAM Left 12/11/2019   Procedure: A/V FISTULAGRAM;  Surgeon: Katha Cabal, MD;  Location: [place] [facility];  Service: Cardiovascular;  Laterality: Left;  . A/V SHUNT INTERVENTION Right 02/21/2019   Procedure: A/V SHUNT INTERVENTION;  Surgeon: Katha Cabal, MD;  Location: [place] [facility];  Service: Cardiovascular;  Laterality:  Right;  . APPLICATION OF WOUND VAC Left 10/03/2017   Procedure: APPLICATION OF WOUND VAC;  Surgeon: Algernon Huxley, MD;  Location: [facility];  Service: General;  Laterality: Left;  . APPLICATION OF WOUND VAC Left 10/11/2017   Procedure: APPLICATION OF WOUND VAC;  Surgeon: Katha Cabal, MD;  Location: [facility];  Service: Vascular;  Laterality: Left;  . APPLICATION OF WOUND VAC Left 10/14/2017   Procedure: WOUND VAC CHANGE;  Surgeon: Katha Cabal, MD;  Location: [facility];  Service: Vascular;  Laterality: Left;  left lower leg  . APPLICATION OF WOUND VAC Left 10/18/2017   Procedure: WOUND VAC CHANGE;  Surgeon: Katha Cabal, MD;  Location: [facility];  Service: Vascular;  Laterality: Left;  . APPLICATION OF WOUND VAC Left 10/07/2017   Procedure: APPLICATION OF WOUND VAC;  Surgeon: Katha Cabal, MD;  Location: [facility];  Service: Vascular;  Laterality: Left;  . AV FISTULA INSERTION W/ RF MAGNETIC GUIDANCE Right 07/19/2018   Procedure: AV FISTULA INSERTION W/RF MAGNETIC GUIDANCE;  Surgeon: Katha Cabal, MD;  Location: [place] [facility];  Service: Cardiovascular;  Laterality: Right;  . AV FISTULA PLACEMENT Left 05/11/2019   Procedure: ARTERIOVENOUS (AV) FISTULA CREATION (RADIOCEPHALIC );  Surgeon: Katha Cabal, MD;  Location: [facility];  Service: Vascular;  Laterality: Left;  . AV FISTULA PLACEMENT Left 02/20/2020   Procedure: INSERTION OF ARTERIOVENOUS (AV) GORE-TEX GRAFT LEFT ARM ( FOREARM LOOP );  Surgeon: Katha Cabal, MD;  Location: [facility];  Service: Vascular;  Laterality: Left;  . CATARACT EXTRACTION W/PHACO Left 11/01/2018   Procedure: CATARACT EXTRACTION PHACO AND INTRAOCULAR LENS PLACEMENT (IOC)-LEFT, DIABETIC-INSULIN DEPENDENT;  Surgeon: Eulogio Bear, MD;  Location: [facility];  Service: Ophthalmology;  Laterality: Left;  [place] 00:41.8 CDE 4.61 Fluid Pack Lot # [id number]  . CATARACT EXTRACTION W/PHACO Right 04/27/2019   Procedure: CATARACT EXTRACTION PHACO AND  INTRAOCULAR LENS PLACEMENT (IOC);  Surgeon: Eulogio Bear, MD;  Location: [facility];  Service: Ophthalmology;  Laterality: Right;  [place] 00:34 CDE 1.97 FLUID PACK LOT # [id number]   . DIALYSIS/PERMA CATHETER INSERTION N/A 02/23/2019   Procedure: DIALYSIS/PERMA CATHETER INSERTION;  Surgeon: Algernon Huxley, MD;  Location: [place] [facility];  Service: Cardiovascular;  Laterality: N/A;  . I & D EXTREMITY Left 10/11/2017   Procedure: IRRIGATION AND DEBRIDEMENT EXTREMITY;  Surgeon: Katha Cabal, MD;  Location: [facility];  Service: Vascular;  Laterality: Left;  . INCISION AND DRAINAGE ABSCESS Right 10/07/2017   Procedure: INCISION AND DRAINAGE ABSCESS;  Surgeon: Katha Cabal, MD;  Location: [facility];  Service: Vascular;  Laterality: Right;  . IRRIGATION AND DEBRIDEMENT ABSCESS Left 10/03/2017   Procedure: IRRIGATION AND DEBRIDEMENT ABSCESS with debridement of skin, soft tissue, muscle 50sq cm;  Surgeon: Algernon Huxley, MD;  Location: [facility];  Service: General;  Laterality: Left;  . TEMPORARY DIALYSIS CATHETER  02/20/2019   Procedure: TEMPORARY DIALYSIS CATHETER;  Surgeon: Katha Cabal, MD;  Location: [place] [facility];  Service: Cardiovascular;;  . UPPER EXTREMITY ANGIOGRAPHY Left 09/18/2019   Procedure: UPPER EXTREMITY ANGIOGRAPHY;  Surgeon: Katha Cabal, MD;  Location: [place] [facility];  Service: Cardiovascular;  Laterality: Left;      Allergies  Allergen Reactions  . Lisinopril Swelling    Lips mouth   . Furosemide Cough    Light headed, blurry vision, weakness.  . Adhesive [Tape] Itching    Current Outpatient Medications  Medication Sig Dispense Refill  . allopurinol (ZYLOPRIM) 100 MG tablet Take 100 mg by mouth daily.    [name] amLODipine (NORVASC) 10 MG tablet Take 10 mg by mouth once a week.     . Ascorbic Acid (VITAMIN C WITH ROSE HIPS) 1000 MG tablet Take 1,000 mg by mouth once a week.     [name] atenolol (TENORMIN) 50 MG tablet Take 50 mg by mouth once a week.     .  B Complex-C-Folic Acid (RENA-VITE RX) 1 MG TABS Take 1 tablet by mouth daily.    . calcium acetate (PHOSLO) 667 MG capsule Take 667-2,001 mg by mouth See admin instructions. Take 2001 mg by mouth with each meal & take 1334 mg by mouth with each snack.    . cinacalcet (SENSIPAR) 30 MG tablet Take 30 mg by mouth daily.    [name] CINNAMON PO Take 1,000 mg by mouth daily.     [name] HYDROcodone-acetaminophen (NORCO) 5-325 MG tablet Take 1-2 tablets by mouth every 6 (six) hours as needed for moderate pain or severe pain. 50 tablet 0  . ibuprofen (ADVIL) 800 MG tablet Take 800 mg by mouth every 8 (eight) hours as needed for moderate pain.     . Insulin Glargine (LANTUS SOLOSTAR) 100 UNIT/ML Solostar Pen Inject 10 Units into the skin at bedtime.     [name]  loratadine (CLARITIN) 10 MG tablet Take 10 mg by mouth daily as needed for allergies.    . melatonin 5 MG TABS Take 5 mg by mouth at bedtime.     . metolazone (ZAROXOLYN) 5 MG tablet Take 5 mg by mouth every Monday, Wednesday, and Friday.     . Multiple Vitamin (MULTIVITAMIN WITH MINERALS) TABS tablet Take 1 tablet by mouth every Tuesday, Thursday, and Saturday at 6 PM. Men's Multivitamin    . Multiple Vitamins-Minerals (EYE HEALTH PO) Take 1 capsule by mouth daily.     . Omega-3 1000 MG CAPS Take 1,000 mg by mouth every other day.     . tadalafil (CIALIS) 10 MG tablet Take 1 tablet (10 mg total) by mouth daily as needed for erectile dysfunction. 30 tablet 11  . testosterone (ANDROGEL) 50 MG/5GM (1%) GEL Place 5 g onto the skin daily. 50 g 5  . Testosterone 20.25 MG/ACT (1.62%) GEL Place 2 Pump onto the skin daily. 88 g 1  . warfarin (COUMADIN) 5 MG tablet Take 5 mg by mouth See admin instructions. Take 15 mg by mouth on Monday and Friday and take 10 mg on Tues-Thursday,Saturday-Sunday     No current facility-administered medications for this visit.        Physical Exam There were no vitals taken for this visit. Gen:  WD/WN, NAD Skin: incision C/D/I, AV  graft left forearm good thrill good bruit easily palpable     Assessment/Plan: 1. End-stage renal disease ([place]) Recommend:  The patient is doing well and currently has adequate dialysis access. The patient's dialysis center is not reporting any access issues. Flow pattern is stable when compared to the prior ultrasound.  OK to begin cannulation  The patient should have a duplex ultrasound of the dialysis access in 6 months. The patient will follow-up with me in the office after each ultrasound   - VAS [place] (AVF, AVG); Future        [name] 03/19/2020, 3:52 PM   This note was created with Dragon medical transcription system.  Any errors from dictation are unintentional.

## 2020-03-19 NOTE — Telephone Encounter (Signed)
Called pt he states that he would like [facility] to [facility], pharmacy updated in chart.

## 2020-03-19 NOTE — Telephone Encounter (Signed)
His chart has [facility] in [place] listed, Karena Addison confirmed that this is correct.

## 2020-03-19 NOTE — Telephone Encounter (Signed)
Which [facility] would [name] like his AndroGel sent?

## 2020-03-19 NOTE — Telephone Encounter (Signed)
The AndroGel is $144 at [facility].  It is only $52 at [facility].  Does he still want it sent to [facility]?  He should look at [facility] and decide where he wants it sent.

## 2020-03-19 NOTE — Progress Notes (Signed)
I have sent the prescription for AndroGel to [facility].  He will need to go to the good Rx website or download the good Rx app on his phone so he can get the coupon for [name] to get that price.  He will need to apply 2 pumps daily to his skin in different locations avoiding the face and scrotum.  He will need to be careful for transference issues and cover the skin where he has applied the gel with close.  We will need to recheck a testosterone level in 4 weeks.  His testosterone level needs to be drawn at least 4 hours after applying the gel.

## 2020-03-20 ENCOUNTER — Encounter (INDEPENDENT_AMBULATORY_CARE_PROVIDER_SITE_OTHER): Payer: Self-pay | Admitting: Vascular Surgery

## 2020-03-20 ENCOUNTER — Ambulatory Visit (INDEPENDENT_AMBULATORY_CARE_PROVIDER_SITE_OTHER): Payer: Medicare Other | Admitting: Vascular Surgery

## 2020-03-20 ENCOUNTER — Ambulatory Visit (INDEPENDENT_AMBULATORY_CARE_PROVIDER_SITE_OTHER): Payer: Medicare Other | Admitting: Nurse Practitioner

## 2020-03-20 ENCOUNTER — Encounter (INDEPENDENT_AMBULATORY_CARE_PROVIDER_SITE_OTHER): Payer: Medicare Other

## 2020-03-20 ENCOUNTER — Other Ambulatory Visit: Payer: Self-pay

## 2020-03-20 VITALS — BP 152/79 | HR 80 | Resp 18 | Ht 76.0 in | Wt 387.0 lb

## 2020-03-20 DIAGNOSIS — N186 End stage renal disease: Secondary | ICD-10-CM

## 2020-03-21 ENCOUNTER — Other Ambulatory Visit
Admission: RE | Admit: 2020-03-21 | Discharge: 2020-03-21 | Disposition: A | Discharge status: Home / Self Care | Payer: Medicare Other | Source: Other Acute Inpatient Hospital | Attending: Nephrology | Admitting: Nephrology

## 2020-03-21 LAB — PROTIME-INR
INR: 1.4 — ABNORMAL HIGH (ref 0.8–1.2)
Prothrombin Time: 16.7 seconds — ABNORMAL HIGH (ref 11.4–15.2)

## 2020-03-25 ENCOUNTER — Telehealth (INDEPENDENT_AMBULATORY_CARE_PROVIDER_SITE_OTHER): Payer: Self-pay

## 2020-03-25 NOTE — Telephone Encounter (Signed)
A fax was received from [place] at [facility] for a permcath removal for the patient. Patient is scheduled with Dr. Delana Meyer on 04/01/20 with a 11:15 am  arrival to the [facility]. Covid testing is on 03/28/20 between 8-1 pm at the [facility]. Pre-procedure instructions were faxed and mailed.

## 2020-03-28 ENCOUNTER — Other Ambulatory Visit: Payer: Medicare Other | Attending: Vascular Surgery

## 2020-03-30 NOTE — Progress Notes (Signed)
03/31/2020 9:01 AM   [name] 26-Dec-1970 [id number]  Referring provider: [name], MD [place],  [place] [address]  Chief Complaint  Patient presents with  . Follow-up    4 week follow-up to check testosterone    HPI: [name] is a 50 y.o. male with ESRD on dialysis, testosterone deficiency and ED who presents today for follow up.  Testosterone deficiency No longer having spontaneous erections at night.  He has sleep apnea and is not sleeping with a CPAP machine.  He is reporting obesity, end-stage renal disease and maintenance hemodialysis and Type 2 diabetes mellitus.  He is currently managing his testosterone deficiency with testosterone gel 1.62%, 2 pumps daily.  He is experiencing more energy.   Erectile dysfunction SHIM score: 16     Main complaint: Losing erection before intercourse x several years Risk factors:  Age, testosterone deficiency, DM, HTN, sleep apnea and ESRD.     No painful erections or curvatures with his erections.    No longer having spontaneous erections.  Tried: Cialis 10 mg daily    SHIM    Row Name 03/31/20 0848         SHIM: Over the last 6 months:   How do you rate your confidence that you could get and keep an erection? Very Low     When you had erections with sexual stimulation, how often were your erections hard enough for penetration (entering your partner)? Most Times (much more than half the time)     During sexual intercourse, how often were you able to maintain your erection after you had penetrated (entered) your partner? Most Times (much more than half the time)     During sexual intercourse, how difficult was it to maintain your erection to completion of intercourse? Difficult     When you attempted sexual intercourse, how often was it satisfactory for you? Most Times (much more than half the time)       SHIM Total Score   SHIM 16            Score: 1-7 Severe ED 8-11 Moderate ED 12-16  Mild-Moderate ED 17-21 Mild ED 22-25 No ED   PMH: Past Medical History:  Diagnosis Date  . Anemia   . Chronic kidney disease    14% FUNCTION  . Diabetes mellitus without complication ([place])   . Dyspnea    DOE  . Dysrhythmia   . End stage renal disease ([place])   . History of orthopnea    error wrong patietn  . Hypercholesteremia    error  . Hyperkalemia    error wrong patient  . Hypertension   . Lymphedema of leg   . Metabolic encephalopathy    error  . Microalbuminuria   . Obesity   . Respiratory failure ([place])    Error  . Sepsis ([place])    error  . Sleep apnea    no CPAP  . UTI (urinary tract infection)    error  . Vitamin D deficiency     Surgical History: Past Surgical History:  Procedure Laterality Date  . A/V FISTULAGRAM Right 10/24/2018   Procedure: A/V FISTULAGRAM;  Surgeon: Katha Cabal, MD;  Location: [facility];  Service: Cardiovascular;  Laterality: Right;  . A/V FISTULAGRAM Right 11/24/2018   Procedure: A/V FISTULAGRAM;  Surgeon: Katha Cabal, MD;  Location: [facility];  Service: Cardiovascular;  Laterality: Right;  . A/V FISTULAGRAM Left  07/31/2019   Procedure: A/V FISTULAGRAM;  Surgeon: Katha Cabal, MD;  Location: [facility];  Service: Cardiovascular;  Laterality: Left;  . A/V FISTULAGRAM Left 12/11/2019   Procedure: A/V FISTULAGRAM;  Surgeon: Katha Cabal, MD;  Location: [facility];  Service: Cardiovascular;  Laterality: Left;  . A/V SHUNT INTERVENTION Right 02/21/2019   Procedure: A/V SHUNT INTERVENTION;  Surgeon: Katha Cabal, MD;  Location: [facility];  Service: Cardiovascular;  Laterality: Right;  . APPLICATION OF WOUND VAC Left 10/03/2017   Procedure: APPLICATION OF WOUND VAC;  Surgeon: Algernon Huxley, MD;  Location: [facility];  Service: General;  Laterality: Left;  . APPLICATION OF WOUND VAC Left 10/11/2017   Procedure: APPLICATION OF WOUND VAC;  Surgeon: Katha Cabal, MD;   Location: [facility];  Service: Vascular;  Laterality: Left;  . APPLICATION OF WOUND VAC Left 10/14/2017   Procedure: WOUND VAC CHANGE;  Surgeon: Katha Cabal, MD;  Location: [facility];  Service: Vascular;  Laterality: Left;  left lower leg  . APPLICATION OF WOUND VAC Left 10/18/2017   Procedure: WOUND VAC CHANGE;  Surgeon: Katha Cabal, MD;  Location: [facility];  Service: Vascular;  Laterality: Left;  . APPLICATION OF WOUND VAC Left 10/07/2017   Procedure: APPLICATION OF WOUND VAC;  Surgeon: Katha Cabal, MD;  Location: [facility];  Service: Vascular;  Laterality: Left;  . AV FISTULA INSERTION W/ RF MAGNETIC GUIDANCE Right 07/19/2018   Procedure: AV FISTULA INSERTION W/RF MAGNETIC GUIDANCE;  Surgeon: Katha Cabal, MD;  Location: [facility];  Service: Cardiovascular;  Laterality: Right;  . AV FISTULA PLACEMENT Left 05/11/2019   Procedure: ARTERIOVENOUS (AV) FISTULA CREATION (RADIOCEPHALIC );  Surgeon: Katha Cabal, MD;  Location: [facility];  Service: Vascular;  Laterality: Left;  . AV FISTULA PLACEMENT Left 02/20/2020   Procedure: INSERTION OF ARTERIOVENOUS (AV) GORE-TEX GRAFT LEFT ARM ( FOREARM LOOP );  Surgeon: Katha Cabal, MD;  Location: [facility];  Service: Vascular;  Laterality: Left;  . CATARACT EXTRACTION W/PHACO Left 11/01/2018   Procedure: CATARACT EXTRACTION PHACO AND INTRAOCULAR LENS PLACEMENT (IOC)-LEFT, DIABETIC-INSULIN DEPENDENT;  Surgeon: Eulogio Bear, MD;  Location: [facility];  Service: Ophthalmology;  Laterality: Left;  [place] 00:41.8 CDE 4.61 Fluid Pack Lot # [id number]  . CATARACT EXTRACTION W/PHACO Right 04/27/2019   Procedure: CATARACT EXTRACTION PHACO AND INTRAOCULAR LENS PLACEMENT (IOC);  Surgeon: Eulogio Bear, MD;  Location: [facility];  Service: Ophthalmology;  Laterality: Right;  [place] 00:34 CDE 1.97 FLUID PACK LOT # [id number]   . DIALYSIS/PERMA CATHETER INSERTION N/A 02/23/2019   Procedure: DIALYSIS/PERMA CATHETER INSERTION;  Surgeon:  Algernon Huxley, MD;  Location: [facility];  Service: Cardiovascular;  Laterality: N/A;  . I & D EXTREMITY Left 10/11/2017   Procedure: IRRIGATION AND DEBRIDEMENT EXTREMITY;  Surgeon: Katha Cabal, MD;  Location: [facility];  Service: Vascular;  Laterality: Left;  . INCISION AND DRAINAGE ABSCESS Right 10/07/2017   Procedure: INCISION AND DRAINAGE ABSCESS;  Surgeon: Katha Cabal, MD;  Location: [facility];  Service: Vascular;  Laterality: Right;  . IRRIGATION AND DEBRIDEMENT ABSCESS Left 10/03/2017   Procedure: IRRIGATION AND DEBRIDEMENT ABSCESS with debridement of skin, soft tissue, muscle 50sq cm;  Surgeon: Algernon Huxley, MD;  Location: [facility];  Service: General;  Laterality: Left;  . TEMPORARY DIALYSIS CATHETER  02/20/2019   Procedure: TEMPORARY DIALYSIS CATHETER;  Surgeon: Katha Cabal, MD;  Location: [facility];  Service: Cardiovascular;;  . UPPER EXTREMITY ANGIOGRAPHY Left 09/18/2019   Procedure: UPPER EXTREMITY ANGIOGRAPHY;  Surgeon: Katha Cabal, MD;  Location: [facility];  Service: Cardiovascular;  Laterality: Left;    Home Medications:  Allergies as of 03/31/2020      Reactions   Lisinopril Swelling   Lips mouth    Furosemide Cough   Light headed, blurry vision, weakness.   Adhesive [tape] Itching      Medication List       Accurate as of March 31, 2020  9:01 AM. If you have any questions, ask your nurse or doctor.        STOP taking these medications   HYDROcodone-acetaminophen 5-325 MG tablet Commonly known as: Norco Stopped by: Zara Council, PA-C     TAKE these medications   allopurinol 100 MG tablet Commonly known as: ZYLOPRIM Take 100 mg by mouth daily.   amLODipine 10 MG tablet Commonly known as: NORVASC Take 5 mg by mouth daily as needed (if bp is 190/90 or higher).   ascorbic acid 500 MG tablet Commonly known as: VITAMIN C Take 500 mg by mouth 3 (three) times a week.   atenolol 50 MG tablet Commonly known as:  TENORMIN Take 25 mg by mouth daily as needed (if bp is 190/90 or higher).   calcium acetate 667 MG capsule Commonly known as: PHOSLO Take 1,334-2,001 mg by mouth See admin instructions. Take 2001 mg by mouth with each meal & take 1334 mg by mouth with each snack.   cinacalcet 30 MG tablet Commonly known as: SENSIPAR Take 30 mg by mouth daily.   CINNAMON PO Take 500 mg by mouth 2 (two) times daily.   EYE HEALTH PO Take 1 capsule by mouth daily.   ibuprofen 200 MG tablet Commonly known as: ADVIL Take 200 mg by mouth every 6 (six) hours as needed for headache or moderate pain.   Lantus SoloStar 100 UNIT/ML Solostar Pen Generic drug: insulin glargine Inject 10 Units into the skin at bedtime.   lidocaine-prilocaine cream Commonly known as: EMLA Apply 1 application topically as needed (port access).   loratadine 10 MG tablet Commonly known as: CLARITIN Take 10 mg by mouth daily as needed for allergies.   melatonin 5 MG Tabs Take 5 mg by mouth at bedtime as needed (sleep).   multivitamin with minerals Tabs tablet Take 1 tablet by mouth 3 (three) times a week. Men's Multivitamin   OMEGA-3-6-9 PO Take 1 capsule by mouth 3 (three) times a week.   Rena-Vite Rx 1 MG Tabs Take 1 tablet by mouth 3 (three) times a week.   SYSTANE OP Place 1 drop into both eyes daily as needed (dry eyes).   tadalafil 10 MG tablet Commonly known as: CIALIS Take 10 mg by mouth daily as needed for erectile dysfunction. What changed: Another medication with the same name was removed. Continue taking this medication, and follow the directions you see here. Changed by: Zara Council, PA-C   Testosterone 20.25 MG/ACT (1.62%) Gel Place 2 Pump onto the skin daily. What changed: Another medication with the same name was removed. Continue taking this medication, and follow the directions you see here. Changed by: Zara Council, PA-C   warfarin 5 MG tablet Commonly known as: COUMADIN Take 10 mg  by mouth daily.       Allergies:  Allergies  Allergen Reactions  . Lisinopril Swelling    Lips mouth   . Furosemide Cough    Light headed,  blurry vision, weakness.  . Adhesive [Tape] Itching    Family History: Family History  Problem Relation Age of Onset  . Diabetes Father   . Kidney disease Father   . Diabetes Daughter     Social History:  reports that he has quit smoking. His smoking use included cigars. He quit smokeless tobacco use about 2 years ago. He reports current alcohol use of about 1.0 standard drink of alcohol per week. He reports previous drug use.  ROS: Pertinent ROS in HPI  Physical Exam: BP (!) 131/73   Pulse 80   Ht 6\' 4"  (1.93 m)   Wt (!) 398 lb (180.5 kg)   BMI 48.45 kg/m   Constitutional:  Well nourished. Alert and oriented, No acute distress. HEENT: [place] AT, mask in place.  Trachea midline Cardiovascular: No clubbing, cyanosis, or edema. Respiratory: Normal respiratory effort, no increased work of breathing. Neurologic: Grossly intact, no focal deficits, moving all 4 extremities. Psychiatric: Normal mood and affect.  Laboratory Data: Lab Results  Component Value Date   WBC 6.0 02/19/2020   HGB 13.9 02/20/2020   HCT 41.0 02/20/2020   MCV 95.0 02/19/2020   PLT 190 02/19/2020    Lab Results  Component Value Date   CREATININE 12.00 (H) 02/20/2020     Lab Results  Component Value Date   TESTOSTERONE 176 (L) 02/29/2020    Lab Results  Component Value Date   HGBA1C 8.3 (H) 02/23/2019    Lab Results  Component Value Date   AST 13 (L) 02/22/2019   Lab Results  Component Value Date   ALT 12 02/22/2019    Urinalysis    Component Value Date/Time   COLORURINE YELLOW (A) 10/03/2017 1120   APPEARANCEUR CLOUDY (A) 10/03/2017 1120   LABSPEC 1.015 10/03/2017 1120   PHURINE 5.0 10/03/2017 1120   GLUCOSEU NEGATIVE 10/03/2017 1120   HGBUR SMALL (A) 10/03/2017 1120   BILIRUBINUR NEGATIVE 10/03/2017 1120   KETONESUR NEGATIVE  10/03/2017 1120   PROTEINUR 30 (A) 10/03/2017 1120   NITRITE NEGATIVE 10/03/2017 1120   LEUKOCYTESUR NEGATIVE 10/03/2017 1120   Component     Latest Ref Rng & Units 03/04/2020  Prostatic Specific Antigen     0.00 - 4.00 ng/mL 0.57   Component     Latest Ref Rng & Units 02/29/2020  LH     1.7 - 8.6 mIU/mL 3.8   I have reviewed the labs.   Pertinent Imaging: No recent imaging  Assessment & Plan:    1. Testosterone deficiency  Testosterone level is pending Continue AndroGel 1.62%, 2 pumps daily Advised the patient that testosterone therapy may worsen sleep apnea RTC in 6 months for HCT/HBG, testosterone and exam  2. Erectile dysfunction:    SHIM score is 16 Continue Cialis, but reduce the dose to 5 mg every three days RTC in 6 months for SHIM score and exam, as testosterone therapy can affect erections    Return in about 6 months (around 10/01/2020) for PSA, testosterone (2 to 4 hours after application) H & H, IPSS, SHIM and exam.  These notes generated with voice recognition software. I apologize for typographical errors.  Zara Council, PA-C  [facility] [address]  [place], [place] [address] [phone]

## 2020-03-31 ENCOUNTER — Other Ambulatory Visit
Admission: RE | Admit: 2020-03-31 | Discharge: 2020-03-31 | Disposition: A | Discharge status: Home / Self Care | Payer: Medicare Other | Attending: Urology | Admitting: Urology

## 2020-03-31 ENCOUNTER — Other Ambulatory Visit (INDEPENDENT_AMBULATORY_CARE_PROVIDER_SITE_OTHER): Payer: Self-pay | Admitting: Nurse Practitioner

## 2020-03-31 ENCOUNTER — Encounter: Payer: Self-pay | Admitting: Urology

## 2020-03-31 ENCOUNTER — Ambulatory Visit (INDEPENDENT_AMBULATORY_CARE_PROVIDER_SITE_OTHER): Payer: Medicare Other | Admitting: Urology

## 2020-03-31 ENCOUNTER — Other Ambulatory Visit: Payer: Self-pay

## 2020-03-31 VITALS — BP 131/73 | HR 80 | Ht 76.0 in | Wt 398.0 lb

## 2020-03-31 DIAGNOSIS — N529 Male erectile dysfunction, unspecified: Secondary | ICD-10-CM | POA: Diagnosis not present

## 2020-03-31 DIAGNOSIS — E349 Endocrine disorder, unspecified: Secondary | ICD-10-CM | POA: Diagnosis not present

## 2020-03-31 DIAGNOSIS — R7989 Other specified abnormal findings of blood chemistry: Secondary | ICD-10-CM | POA: Diagnosis present

## 2020-04-01 ENCOUNTER — Ambulatory Visit
Admission: RE | Admit: 2020-04-01 | Discharge: 2020-04-03 | Disposition: A | Discharge status: Home / Self Care | Payer: Medicare Other | Attending: Vascular Surgery | Admitting: Vascular Surgery

## 2020-04-01 ENCOUNTER — Other Ambulatory Visit
Admit: 2020-04-01 | Discharge: 2020-04-01 | Disposition: A | Discharge status: Home / Self Care | Payer: Medicare Other | Attending: Vascular Surgery | Admitting: Vascular Surgery

## 2020-04-01 ENCOUNTER — Telehealth: Payer: Self-pay | Admitting: Family Medicine

## 2020-04-01 ENCOUNTER — Other Ambulatory Visit: Payer: Self-pay

## 2020-04-01 DIAGNOSIS — D649 Anemia, unspecified: Secondary | ICD-10-CM | POA: Insufficient documentation

## 2020-04-01 DIAGNOSIS — Z20822 Contact with and (suspected) exposure to covid-19: Secondary | ICD-10-CM | POA: Insufficient documentation

## 2020-04-01 DIAGNOSIS — Z4901 Encounter for fitting and adjustment of extracorporeal dialysis catheter: Secondary | ICD-10-CM | POA: Diagnosis present

## 2020-04-01 DIAGNOSIS — Z794 Long term (current) use of insulin: Secondary | ICD-10-CM | POA: Diagnosis not present

## 2020-04-01 DIAGNOSIS — G4730 Sleep apnea, unspecified: Secondary | ICD-10-CM | POA: Diagnosis not present

## 2020-04-01 DIAGNOSIS — N186 End stage renal disease: Secondary | ICD-10-CM | POA: Diagnosis not present

## 2020-04-01 DIAGNOSIS — E1122 Type 2 diabetes mellitus with diabetic chronic kidney disease: Secondary | ICD-10-CM | POA: Diagnosis not present

## 2020-04-01 DIAGNOSIS — Z79899 Other long term (current) drug therapy: Secondary | ICD-10-CM | POA: Insufficient documentation

## 2020-04-01 DIAGNOSIS — I120 Hypertensive chronic kidney disease with stage 5 chronic kidney disease or end stage renal disease: Secondary | ICD-10-CM | POA: Diagnosis not present

## 2020-04-01 DIAGNOSIS — Z7901 Long term (current) use of anticoagulants: Secondary | ICD-10-CM | POA: Diagnosis not present

## 2020-04-01 LAB — TESTOSTERONE: Testosterone: 303 ng/dL (ref 264–916)

## 2020-04-01 LAB — PROLACTIN: Prolactin: 26.1 ng/mL — ABNORMAL HIGH (ref 4.0–15.2)

## 2020-04-01 LAB — SARS CORONAVIRUS 2 (TAT 6-24 HRS): SARS Coronavirus 2: NEGATIVE

## 2020-04-01 NOTE — Telephone Encounter (Signed)
-----   Message from Nori Riis, PA-C sent at 04/01/2020  9:01 AM EDT ----- Please let [name] know that his prolactin level has returned elevated.  We need to repeat it next week.  If it remains elevated, we will need to schedule a MRI of his pituitary gland to ensure there is no tumor.

## 2020-04-01 NOTE — Telephone Encounter (Signed)
Patient notified and appointment made

## 2020-04-01 NOTE — Telephone Encounter (Signed)
Patient notified and lab appointment made.

## 2020-04-01 NOTE — Telephone Encounter (Signed)
-----   Message from Nori Riis, PA-C sent at 04/01/2020  7:58 AM EDT ----- Please let Mr. Bundren that his testosterone level is not at therapeutic levels.  I would recommend to increase his dose to three pumps daily and recheck his testosterone level (2 to 4 hours after application) in one month.

## 2020-04-03 ENCOUNTER — Encounter
Admission: RE | Disposition: A | Discharge status: Home / Self Care | Payer: Self-pay | Source: Home / Self Care | Attending: Vascular Surgery

## 2020-04-03 DIAGNOSIS — Z4901 Encounter for fitting and adjustment of extracorporeal dialysis catheter: Secondary | ICD-10-CM | POA: Diagnosis not present

## 2020-04-03 DIAGNOSIS — N186 End stage renal disease: Secondary | ICD-10-CM

## 2020-04-03 HISTORY — PX: DIALYSIS/PERMA CATHETER REMOVAL: CATH118289

## 2020-04-03 SURGERY — DIALYSIS/PERMA CATHETER REMOVAL
Anesthesia: LOCAL

## 2020-04-03 MED ORDER — LIDOCAINE-EPINEPHRINE (PF) 1 %-1:200000 IJ SOLN
INTRAMUSCULAR | Status: DC | PRN
  Administered 2020-04-03: 20 mL via INTRADERMAL

## 2020-04-03 SURGICAL SUPPLY — 3 items
CHLORAPREP W/TINT 26 (MISCELLANEOUS) ×4 IMPLANT
FORCEPS HALSTEAD CVD 5IN STRL (INSTRUMENTS) ×2 IMPLANT
TRAY LACERAT/PLASTIC (MISCELLANEOUS) ×2 IMPLANT

## 2020-04-03 NOTE — Op Note (Signed)
Operative Note   Preoperative diagnosis:    1. ESRD with functional permanent access  Postoperative diagnosis:   1. ESRD with functional permanent access  Procedure:  Removal of Right Permcath  Performing Clinician: Hezzie Bump PA-C  Supervising Surgeon:  Ella Jubilee, MD  Anesthesia:  Local  EBL:  Minimal  Indication for the Procedure:  The patient has a functional permanent dialysis access and no longer needs their permcath.  This can be removed.  Risks and benefits are discussed and informed consent is obtained.  Description of the Procedure:  The patient's right neck, chest and existing catheter were sterilely prepped and draped. The area around the catheter was anesthetized copiously with 1% lidocaine. The catheter was dissected out with curved hemostats until the cuff was freed from the surrounding fibrous sheath. The fiber sheath was transected, and the catheter was then removed in its entirety using gentle traction. Pressure was held and sterile dressings were placed. The patient tolerated the procedure well and was taken to the recovery room in stable condition.  Mardy Hoppe A Taryn Shellhammer  04/03/2020, 2:52 PM  This note was created with Dragon Medical transcription system. Any errors in dictation are purely unintentional.

## 2020-04-03 NOTE — Interval H&P Note (Signed)
History and Physical Interval Note:  04/03/2020 2:50 PM  [name]  has presented today for surgery, with the diagnosis of Perma Cath Removal   End Stage Renal  Covid July 30.  The various methods of treatment have been discussed with the patient and family. After consideration of risks, benefits and other options for treatment, the patient has consented to  Procedure(s): DIALYSIS/PERMA CATHETER REMOVAL (N/A) as a surgical intervention.  The patient's history has been reviewed, patient examined, no change in status, stable for surgery.  I have reviewed the patient's chart and labs.  Questions were answered to the patient's satisfaction.     [place]

## 2020-04-03 NOTE — Discharge Instructions (Signed)
Tunneled Catheter Removal, Care After Refer to this sheet in the next few weeks. These instructions provide you with information about caring for yourself after your procedure. Your health care provider may also give you more specific instructions. Your treatment has been planned according to current medical practices, but problems sometimes occur. Call your health care provider if you have any problems or questions after your procedure. What can I expect after the procedure? After the procedure, it is common to have: Some mild redness, swelling, and pain around your catheter site.   Follow these instructions at home: Incision care  Check your removal site  every day for signs of infection. Check for: More redness, swelling, or pain. More fluid or blood. Warmth. Pus or a bad smell. Remove your dressing in 48hrs leave open to air  Activity  Return to your normal activities as told by your health care provider. Ask your health care provider what activities are safe for you. Do not lift anything that is heavier than 10 lb (4.5 kg) for 3 days  You may shower tomorrow  Contact a health care provider if: You have more fluid or blood coming from your removal site You have more redness, swelling, or pain at your incisions or around the area where your catheter was removed Your removal site feel warm to the touch. You feel unusually weak. You feel nauseous.. Get help right away if You have swelling in your arm, shoulder, neck, or face. You develop chest pain. You have difficulty breathing. You feel dizzy or light-headed. You have pus or a bad smell coming from your removal site You have a fever. You develop bleeding from your removal site, and your bleeding does not stop. This information is not intended to replace advice given to you by your health care provider. Make sure you discuss any questions you have with your health care provider. Document Released: 08/02/2012 Document Revised:  04/18/2016 Document Reviewed: 05/12/2015 Elsevier Interactive Patient Education  2017 [employer]vier Inc[employer] 

## 2020-04-07 ENCOUNTER — Encounter: Payer: Self-pay | Admitting: Vascular Surgery

## 2020-04-09 ENCOUNTER — Other Ambulatory Visit: Payer: Self-pay | Admitting: Family Medicine

## 2020-04-09 DIAGNOSIS — E349 Endocrine disorder, unspecified: Secondary | ICD-10-CM

## 2020-04-10 ENCOUNTER — Other Ambulatory Visit: Payer: Medicare Other

## 2020-04-11 ENCOUNTER — Encounter: Payer: Self-pay | Admitting: Urology

## 2020-04-13 ENCOUNTER — Telehealth: Payer: Self-pay | Admitting: Urology

## 2020-04-13 DIAGNOSIS — E349 Endocrine disorder, unspecified: Secondary | ICD-10-CM

## 2020-04-13 NOTE — Telephone Encounter (Signed)
Would you have [name] come in this week for a prolactin level?

## 2020-04-14 NOTE — Telephone Encounter (Signed)
LMOM for patient to call and schedule a lab appointment this week.

## 2020-04-15 ENCOUNTER — Other Ambulatory Visit: Payer: Self-pay | Admitting: Family Medicine

## 2020-04-15 ENCOUNTER — Other Ambulatory Visit
Admission: RE | Admit: 2020-04-15 | Discharge: 2020-04-15 | Disposition: A | Discharge status: Home / Self Care | Payer: Medicare Other | Attending: Urology | Admitting: Urology

## 2020-04-15 DIAGNOSIS — E349 Endocrine disorder, unspecified: Secondary | ICD-10-CM | POA: Diagnosis present

## 2020-04-15 NOTE — Telephone Encounter (Signed)
Patient notified and will go to the lab in [facility] today for the blood draw.

## 2020-04-16 ENCOUNTER — Telehealth: Payer: Self-pay | Admitting: Family Medicine

## 2020-04-16 ENCOUNTER — Other Ambulatory Visit: Payer: Self-pay | Admitting: Urology

## 2020-04-16 DIAGNOSIS — R7989 Other specified abnormal findings of blood chemistry: Secondary | ICD-10-CM

## 2020-04-16 LAB — PROLACTIN: Prolactin: 25.5 ng/mL — ABNORMAL HIGH (ref 4.0–15.2)

## 2020-04-16 NOTE — Telephone Encounter (Signed)
Patient notified and voiced understanding.

## 2020-04-16 NOTE — Telephone Encounter (Signed)
-----   Message from Nori Riis, PA-C sent at 04/16/2020  8:38 AM EDT ----- Please let [name] that we need to schedule a MRI of his pituitary gland to rule out a tumor that may be causing his elevated prolactin. Orders in for MRI.

## 2020-05-06 ENCOUNTER — Other Ambulatory Visit: Payer: Self-pay

## 2020-05-08 ENCOUNTER — Other Ambulatory Visit
Admission: RE | Admit: 2020-05-08 | Discharge: 2020-05-08 | Disposition: A | Discharge status: Home / Self Care | Payer: Medicare Other | Source: Ambulatory Visit | Attending: Internal Medicine | Admitting: Internal Medicine

## 2020-05-08 ENCOUNTER — Other Ambulatory Visit: Payer: Self-pay

## 2020-05-08 DIAGNOSIS — Z20822 Contact with and (suspected) exposure to covid-19: Secondary | ICD-10-CM | POA: Diagnosis not present

## 2020-05-08 DIAGNOSIS — Z01818 Encounter for other preprocedural examination: Secondary | ICD-10-CM | POA: Insufficient documentation

## 2020-05-09 ENCOUNTER — Encounter: Payer: Self-pay | Admitting: Internal Medicine

## 2020-05-09 ENCOUNTER — Ambulatory Visit
Admission: RE | Admit: 2020-05-09 | Discharge: 2020-05-09 | Disposition: A | Discharge status: Home / Self Care | Payer: Medicare Other | Source: Ambulatory Visit | Attending: Urology | Admitting: Urology

## 2020-05-09 DIAGNOSIS — R7989 Other specified abnormal findings of blood chemistry: Secondary | ICD-10-CM | POA: Diagnosis present

## 2020-05-09 LAB — SARS CORONAVIRUS 2 (TAT 6-24 HRS): SARS Coronavirus 2: NEGATIVE

## 2020-05-09 MED ORDER — GADOBUTROL 1 MMOL/ML IV SOLN
10.0000 mL | Freq: Once | INTRAVENOUS | Status: AC | PRN
  Administered 2020-05-09: 10 mL via INTRAVENOUS

## 2020-05-12 ENCOUNTER — Ambulatory Visit: Payer: Medicare Other | Admitting: Certified Registered Nurse Anesthetist

## 2020-05-12 ENCOUNTER — Other Ambulatory Visit: Payer: Self-pay

## 2020-05-12 ENCOUNTER — Other Ambulatory Visit: Payer: Self-pay | Admitting: Urology

## 2020-05-12 ENCOUNTER — Encounter
Admission: RE | Disposition: A | Discharge status: Home / Self Care | Payer: Self-pay | Source: Home / Self Care | Attending: Internal Medicine

## 2020-05-12 ENCOUNTER — Telehealth: Payer: Self-pay | Admitting: Urology

## 2020-05-12 ENCOUNTER — Ambulatory Visit
Admission: RE | Admit: 2020-05-12 | Discharge: 2020-05-12 | Disposition: A | Discharge status: Home / Self Care | Payer: Medicare Other | Attending: Internal Medicine | Admitting: Internal Medicine

## 2020-05-12 DIAGNOSIS — N529 Male erectile dysfunction, unspecified: Secondary | ICD-10-CM | POA: Diagnosis not present

## 2020-05-12 DIAGNOSIS — Z79899 Other long term (current) drug therapy: Secondary | ICD-10-CM | POA: Insufficient documentation

## 2020-05-12 DIAGNOSIS — E229 Hyperfunction of pituitary gland, unspecified: Secondary | ICD-10-CM

## 2020-05-12 DIAGNOSIS — N186 End stage renal disease: Secondary | ICD-10-CM | POA: Insufficient documentation

## 2020-05-12 DIAGNOSIS — G4730 Sleep apnea, unspecified: Secondary | ICD-10-CM | POA: Insufficient documentation

## 2020-05-12 DIAGNOSIS — E669 Obesity, unspecified: Secondary | ICD-10-CM | POA: Diagnosis not present

## 2020-05-12 DIAGNOSIS — K5730 Diverticulosis of large intestine without perforation or abscess without bleeding: Secondary | ICD-10-CM | POA: Insufficient documentation

## 2020-05-12 DIAGNOSIS — Z794 Long term (current) use of insulin: Secondary | ICD-10-CM | POA: Diagnosis not present

## 2020-05-12 DIAGNOSIS — R7989 Other specified abnormal findings of blood chemistry: Secondary | ICD-10-CM

## 2020-05-12 DIAGNOSIS — I120 Hypertensive chronic kidney disease with stage 5 chronic kidney disease or end stage renal disease: Secondary | ICD-10-CM | POA: Insufficient documentation

## 2020-05-12 DIAGNOSIS — E1151 Type 2 diabetes mellitus with diabetic peripheral angiopathy without gangrene: Secondary | ICD-10-CM | POA: Diagnosis not present

## 2020-05-12 DIAGNOSIS — Z7901 Long term (current) use of anticoagulants: Secondary | ICD-10-CM | POA: Insufficient documentation

## 2020-05-12 DIAGNOSIS — D122 Benign neoplasm of ascending colon: Secondary | ICD-10-CM | POA: Insufficient documentation

## 2020-05-12 DIAGNOSIS — Z992 Dependence on renal dialysis: Secondary | ICD-10-CM | POA: Diagnosis not present

## 2020-05-12 DIAGNOSIS — K640 First degree hemorrhoids: Secondary | ICD-10-CM | POA: Diagnosis not present

## 2020-05-12 DIAGNOSIS — Z6841 Body Mass Index (BMI) 40.0 and over, adult: Secondary | ICD-10-CM | POA: Insufficient documentation

## 2020-05-12 DIAGNOSIS — Z1211 Encounter for screening for malignant neoplasm of colon: Secondary | ICD-10-CM | POA: Diagnosis present

## 2020-05-12 DIAGNOSIS — E1122 Type 2 diabetes mellitus with diabetic chronic kidney disease: Secondary | ICD-10-CM | POA: Diagnosis not present

## 2020-05-12 DIAGNOSIS — Z888 Allergy status to other drugs, medicaments and biological substances status: Secondary | ICD-10-CM | POA: Diagnosis not present

## 2020-05-12 DIAGNOSIS — E11319 Type 2 diabetes mellitus with unspecified diabetic retinopathy without macular edema: Secondary | ICD-10-CM | POA: Insufficient documentation

## 2020-05-12 HISTORY — DX: Type 2 diabetes mellitus with unspecified diabetic retinopathy without macular edema: E11.319

## 2020-05-12 HISTORY — DX: Varicose veins of unspecified lower extremity with ulcer of unspecified site: L97.909

## 2020-05-12 HISTORY — DX: Methicillin susceptible Staphylococcus aureus infection, unspecified site: A49.01

## 2020-05-12 HISTORY — PX: COLONOSCOPY WITH PROPOFOL: SHX5780

## 2020-05-12 HISTORY — DX: Long term (current) use of insulin: Z79.4

## 2020-05-12 HISTORY — DX: Peripheral vascular disease, unspecified: I73.9

## 2020-05-12 HISTORY — DX: Non-pressure chronic ulcer of unspecified part of unspecified lower leg with unspecified severity: I83.009

## 2020-05-12 LAB — GLUCOSE, CAPILLARY: Glucose-Capillary: 124 mg/dL — ABNORMAL HIGH (ref 70–99)

## 2020-05-12 LAB — POTASSIUM: Potassium: 5.1 mmol/L (ref 3.5–5.1)

## 2020-05-12 SURGERY — COLONOSCOPY WITH PROPOFOL
Anesthesia: General

## 2020-05-12 MED ORDER — LIDOCAINE HCL (CARDIAC) PF 100 MG/5ML IV SOSY
PREFILLED_SYRINGE | INTRAVENOUS | Status: DC | PRN
  Administered 2020-05-12: 50 mg via INTRAVENOUS

## 2020-05-12 MED ORDER — PROPOFOL 10 MG/ML IV BOLUS
INTRAVENOUS | Status: DC | PRN
  Administered 2020-05-12: 50 mg via INTRAVENOUS

## 2020-05-12 MED ORDER — PROPOFOL 10 MG/ML IV BOLUS
INTRAVENOUS | Status: AC
  Filled 2020-05-12: qty 20

## 2020-05-12 MED ORDER — GLYCOPYRROLATE 0.2 MG/ML IJ SOLN
INTRAMUSCULAR | Status: DC | PRN
  Administered 2020-05-12: .2 mg via INTRAVENOUS

## 2020-05-12 MED ORDER — PROPOFOL 500 MG/50ML IV EMUL
INTRAVENOUS | Status: DC | PRN
  Administered 2020-05-12: 100 ug/kg/min via INTRAVENOUS

## 2020-05-12 MED ORDER — SODIUM CHLORIDE 0.9 % IV SOLN
INTRAVENOUS | Status: DC
  Administered 2020-05-12: 20 mL/h via INTRAVENOUS

## 2020-05-12 MED ORDER — PROPOFOL 10 MG/ML IV BOLUS
INTRAVENOUS | Status: AC
  Filled 2020-05-12: qty 40

## 2020-05-12 NOTE — Anesthesia Procedure Notes (Signed)
Procedure Name: MAC Date/Time: 05/12/2020 9:43 AM Performed by: Arita Miss, MD Pre-anesthesia Checklist: Patient identified, Emergency Drugs available, Suction available, Patient being monitored and Timeout performed Patient Re-evaluated:Patient Re-evaluated prior to induction Oxygen Delivery Method: Supernova nasal CPAP Induction Type: IV induction Comments: Good seal with XL supernova, ventilating adequately

## 2020-05-12 NOTE — Anesthesia Postprocedure Evaluation (Signed)
Anesthesia Post Note  Patient: [name]  Procedure(s) Performed: COLONOSCOPY WITH PROPOFOL (N/A )  Patient location during evaluation: Endoscopy Anesthesia Type: General Level of consciousness: awake and alert Pain management: pain level controlled Vital Signs Assessment: post-procedure vital signs reviewed and stable Respiratory status: spontaneous breathing, nonlabored ventilation, respiratory function stable and patient connected to nasal cannula oxygen Cardiovascular status: blood pressure returned to baseline and stable Postop Assessment: no apparent nausea or vomiting Anesthetic complications: no   No complications documented.   Last Vitals:  Vitals:   05/12/20 1000 05/12/20 1005  BP: 115/76 115/76  Pulse: 75 74  Resp: 20 15  Temp: (!) 36.2 C   SpO2: 98% 98%    Last Pain:  Vitals:   05/12/20 1000  TempSrc: Temporal  PainSc:                  Arita Miss

## 2020-05-12 NOTE — Op Note (Signed)
[facility] Gastroenterology Patient Name: [name] Procedure Date: 05/12/2020 7:22 AM MRN: [id number] Account #: [id number] Date of Birth: June 26, 1971 Admit Type: Outpatient Age: 49 Room: [facility] ENDO ROOM 3 Gender: Male Note Status: Finalized Procedure:             Colonoscopy Indications:           Screening for colorectal malignant neoplasm Providers:             Benay Pike. Alice Reichert MD, MD Referring MD:          [name] (Referring MD) Medicines:             Propofol per Anesthesia Complications:         No immediate complications. Procedure:             Pre-Anesthesia Assessment:                        - The risks and benefits of the procedure and the                         sedation options and risks were discussed with the                         patient. All questions were answered and informed                         consent was obtained.                        - Patient identification and proposed procedure were                         verified prior to the procedure by the nurse. The                         procedure was verified in the procedure room.                        - ASA Grade Assessment: III - A patient with severe                         systemic disease.                        - After reviewing the risks and benefits, the patient                         was deemed in satisfactory condition to undergo the                         procedure.                        After obtaining informed consent, the colonoscope was                         passed under direct vision. Throughout the procedure,                         the patient's blood pressure, pulse,  and oxygen                         saturations were monitored continuously. The                         Colonoscope was introduced through the anus and                         advanced to the the cecum, identified by appendiceal                         orifice and ileocecal valve. The  Colonoscope was                         introduced through the and advanced to the. The                         colonoscopy was somewhat difficult due to significant                         looping. Successful completion of the procedure was                         aided by straightening and shortening the scope to                         obtain bowel loop reduction. The patient tolerated the                         procedure well. The quality of the bowel preparation                         was adequate. The ileocecal valve, appendiceal                         orifice, and rectum were photographed. Findings:      The perianal and digital rectal examinations were normal. Pertinent       negatives include normal sphincter tone and no palpable rectal lesions.      Non-bleeding internal hemorrhoids were found during retroflexion. The       hemorrhoids were Grade I (internal hemorrhoids that do not prolapse).      Many small and large-mouthed diverticula were found in the entire colon.       There was no evidence of diverticular bleeding.      A 12 mm polyp was found in the ascending colon. The polyp was       semi-pedunculated. The polyp was removed with a hot snare. Resection and       retrieval were complete. To prevent bleeding after the polypectomy, one       hemostatic clip was successfully placed (MR conditional). There was no       bleeding during, or at the end, of the procedure.      The exam was otherwise without abnormality. Impression:            - Non-bleeding internal hemorrhoids.                        - Mild diverticulosis in  the entire examined colon.                         There was no evidence of diverticular bleeding.                        - One 12 mm polyp in the ascending colon, removed with                         a hot snare. Resected and retrieved. Clip (MR                         conditional) was placed.                        - The examination was otherwise  normal. Recommendation:        - Patient has a contact number available for                         emergencies. The signs and symptoms of potential                         delayed complications were discussed with the patient.                         Return to normal activities tomorrow. Written                         discharge instructions were provided to the patient.                        - Resume previous diet.                        - Continue present medications.                        - Repeat colonoscopy is recommended for surveillance.                         The colonoscopy date will be determined after                         pathology results from today's exam become available                         for review.                        - Return to GI office PRN.                        - The findings and recommendations were discussed with                         the patient. Procedure Code(s):     --- Professional ---                        [phone], Colonoscopy, flexible; with removal of  tumor(s), polyp(s), or other lesion(s) by snare                         technique Diagnosis Code(s):     --- Professional ---                        K57.30, Diverticulosis of large intestine without                         perforation or abscess without bleeding                        K63.5, Polyp of colon                        K64.0, First degree hemorrhoids                        Z12.11, Encounter for screening for malignant neoplasm                         of colon CPT copyright 2019 American [employer]. All rights reserved. The codes documented in this report are preliminary and upon coder review may  be revised to meet current compliance requirements. Efrain Sella MD, MD 05/12/2020 10:02:36 AM This report has been signed electronically. Number of Addenda: 0 Note Initiated On: 05/12/2020 7:22 AM Scope Withdrawal Time: 0 hours 13 minutes 7 seconds   Total Procedure Duration: 0 hours 16 minutes 18 seconds  Estimated Blood Loss:  Estimated blood loss: none.      [facility]

## 2020-05-12 NOTE — Interval H&P Note (Signed)
History and Physical Interval Note:  05/12/2020 8:41 AM  [name]  has presented today for surgery, with the diagnosis of SCREENING.  The various methods of treatment have been discussed with the patient and family. After consideration of risks, benefits and other options for treatment, the patient has consented to  Procedure(s): COLONOSCOPY WITH PROPOFOL (N/A) as a surgical intervention.  The patient's history has been reviewed, patient examined, no change in status, stable for surgery.  I have reviewed the patient's chart and labs.  Questions were answered to the patient's satisfaction.     Antoine, [place]

## 2020-05-12 NOTE — Telephone Encounter (Signed)
Please let [name] know that his MRI of his pituitary gland noted a small functioning tumor.  We need to get him referred to endocrinology at this time.  Order in for referral.

## 2020-05-12 NOTE — Transfer of Care (Signed)
Immediate Anesthesia Transfer of Care Note  Patient: [name]  Procedure(s) Performed: COLONOSCOPY WITH PROPOFOL (N/A )  Patient Location: PACU  Anesthesia Type:General  Level of Consciousness: awake, alert  and oriented  Airway & Oxygen Therapy: Patient Spontanous Breathing  Post-op Assessment: Report given to RN  Post vital signs: Reviewed and stable  Last Vitals:  Vitals Value Taken Time  BP 115/76 05/12/20 1005  Temp 36.2 C 05/12/20 1000  Pulse 74 05/12/20 1005  Resp 15 05/12/20 1005  SpO2 98 % 05/12/20 1005    Last Pain:  Vitals:   05/12/20 1000  TempSrc: Temporal  PainSc:          Complications: No complications documented.

## 2020-05-12 NOTE — Interval H&P Note (Signed)
History and Physical Interval Note:  05/12/2020 8:42 AM  [name]  has presented today for surgery, with the diagnosis of SCREENING.  The various methods of treatment have been discussed with the patient and family. After consideration of risks, benefits and other options for treatment, the patient has consented to  Procedure(s): COLONOSCOPY WITH PROPOFOL (N/A) as a surgical intervention.  The patient's history has been reviewed, patient examined, no change in status, stable for surgery.  I have reviewed the patient's chart and labs.  Questions were answered to the patient's satisfaction.     Morris, [place]

## 2020-05-12 NOTE — Anesthesia Preprocedure Evaluation (Signed)
Anesthesia Evaluation  Patient identified by MRN, date of birth, ID band Patient awake    Reviewed: Allergy & Precautions, H&P , NPO status , Patient's Chart, lab work & pertinent test results  History of Anesthesia Complications Negative for: history of anesthetic complications  Airway Mallampati: III  TM Distance: >3 FB Neck ROM: full   Comment: Large neck Dental  (+) Chipped, Poor Dentition, Partial Upper, Missing   Pulmonary shortness of breath, sleep apnea , former smoker,     + decreased breath sounds      Cardiovascular hypertension, (-) angina+ Peripheral Vascular Disease and + Orthopnea  (-) Past MI Normal cardiovascular exam+ dysrhythmias  Rhythm:Regular Rate:Normal     Neuro/Psych negative neurological ROS  negative psych ROS   GI/Hepatic negative GI ROS, Neg liver ROS, neg GERD  ,  Endo/Other  diabetes, Type 2, Insulin Dependent  Renal/GU ESRF and DialysisRenal diseaseLast dialyzed Friday (3 days ago) . On a MWF schedule     Musculoskeletal   Abdominal (+) + obese,   Peds  Hematology negative hematology ROS (+)   Anesthesia Other Findings Past Medical History: No date: Anemia No date: Chronic kidney disease     Comment:  14% FUNCTION No date: Diabetes mellitus without complication (HCC) No date: Dyspnea     Comment:  DOE No date: Dysrhythmia No date: End stage renal disease (HCC) No date: History of orthopnea     Comment:  error wrong patietn No date: Hypercholesteremia     Comment:  error No date: Hyperkalemia     Comment:  error wrong patient No date: Hypertension No date: Lymphedema of leg No date: Metabolic encephalopathy     Comment:  error No date: Microalbuminuria No date: Obesity No date: Respiratory failure (HCC)     Comment:  Error No date: Sepsis ([place])     Comment:  error No date: Sleep apnea     Comment:  no CPAP No date: UTI (urinary tract infection)     Comment:   error No date: Vitamin D deficiency  Past Surgical History: 10/24/2018: A/V FISTULAGRAM; Right     Comment:  Procedure: A/V FISTULAGRAM;  Surgeon: Katha Cabal, MD;  Location: [place] [facility];  Service:               Cardiovascular;  Laterality: Right; 11/24/2018: A/V FISTULAGRAM; Right     Comment:  Procedure: A/V FISTULAGRAM;  Surgeon: Katha Cabal, MD;  Location: [place] [facility];  Service:               Cardiovascular;  Laterality: Right; 07/31/2019: A/V FISTULAGRAM; Left     Comment:  Procedure: A/V FISTULAGRAM;  Surgeon: Katha Cabal, MD;  Location: [place] [facility];  Service:               Cardiovascular;  Laterality: Left; 12/11/2019: A/V FISTULAGRAM; Left     Comment:  Procedure: A/V FISTULAGRAM;  Surgeon: Katha Cabal, MD;  Location: [place] [facility];  Service:               Cardiovascular;  Laterality: Left; 02/21/2019: A/V SHUNT INTERVENTION; Right  Comment:  Procedure: A/V SHUNT INTERVENTION;  Surgeon: Katha Cabal, MD;  Location: [place] [facility];  Service:              Cardiovascular;  Laterality: Right; 7/0/4888: APPLICATION OF WOUND VAC; Left     Comment:  Procedure: APPLICATION OF WOUND VAC;  Surgeon: Algernon Huxley, MD;  Location: [facility];  Service: General;                Laterality: Left; 05/15/9449: APPLICATION OF WOUND VAC; Left     Comment:  Procedure: APPLICATION OF WOUND VAC;  Surgeon: Katha Cabal, MD;  Location: [facility];  Service: Vascular;                Laterality: Left; 3/88/8280: APPLICATION OF WOUND VAC; Left     Comment:  Procedure: WOUND VAC CHANGE;  Surgeon: Katha Cabal, MD;  Location: [facility];  Service: Vascular;                Laterality: Left;  left lower leg 0/34/9179: APPLICATION OF WOUND VAC; Left     Comment:  Procedure: WOUND VAC CHANGE;  Surgeon: Katha Cabal, MD;  Location: [facility];  Service: Vascular;                Laterality: Left; 09/04/567: APPLICATION OF WOUND VAC; Left     Comment:  Procedure: APPLICATION OF WOUND VAC;  Surgeon: Katha Cabal, MD;  Location: [facility];  Service: Vascular;                Laterality: Left; 07/19/2018: AV FISTULA INSERTION W/ RF MAGNETIC GUIDANCE; Right     Comment:  Procedure: AV FISTULA INSERTION W/RF MAGNETIC GUIDANCE;               Surgeon: Katha Cabal, MD;  Location: [place]              CV LAB;  Service: Cardiovascular;  Laterality: Right; 05/11/2019: AV FISTULA PLACEMENT; Left     Comment:  Procedure: ARTERIOVENOUS (AV) FISTULA CREATION               (RADIOCEPHALIC );  Surgeon: Katha Cabal, MD;                Location: [facility];  Service: Vascular;  Laterality:               Left; 11/01/2018: CATARACT EXTRACTION W/PHACO; Left     Comment:  Procedure: CATARACT EXTRACTION PHACO AND INTRAOCULAR               LENS PLACEMENT (IOC)-LEFT, DIABETIC-INSULIN DEPENDENT;                Surgeon: Eulogio Bear, MD;  Location: [facility];                Service: Ophthalmology;  Laterality: Left;  [place]               00:41.8 CDE 4.61  Fluid Pack Lot # [id number] 04/27/2019: CATARACT EXTRACTION W/PHACO; Right     Comment:  Procedure: CATARACT EXTRACTION PHACO AND INTRAOCULAR               LENS PLACEMENT (IOC);  Surgeon: Eulogio Bear, MD;                Location: [facility];  Service: Ophthalmology;  Laterality:              Right;  [place] 00:34 CDE 1.97 FLUID PACK LOT # [id number]  02/23/2019: DIALYSIS/PERMA CATHETER INSERTION; N/A     Comment:  Procedure: DIALYSIS/PERMA CATHETER INSERTION;  Surgeon:               Algernon Huxley, MD;  Location: [place] [facility];                Service: Cardiovascular;  Laterality: N/A; 10/11/2017: I & D EXTREMITY; Left     Comment:  Procedure: IRRIGATION AND DEBRIDEMENT EXTREMITY;                Surgeon: Katha Cabal, MD;   Location: [facility];                Service: Vascular;  Laterality: Left; 10/07/2017: INCISION AND DRAINAGE ABSCESS; Right     Comment:  Procedure: INCISION AND DRAINAGE ABSCESS;  Surgeon:               Katha Cabal, MD;  Location: [facility];  Service:               Vascular;  Laterality: Right; 10/03/2017: IRRIGATION AND DEBRIDEMENT ABSCESS; Left     Comment:  Procedure: IRRIGATION AND [place] with               debridement of skin, soft tissue, muscle 50sq cm;                Surgeon: Algernon Huxley, MD;  Location: [facility];  Service:              General;  Laterality: Left; 02/20/2019: TEMPORARY DIALYSIS CATHETER     Comment:  Procedure: TEMPORARY DIALYSIS CATHETER;  Surgeon:               Katha Cabal, MD;  Location: [place] [facility];               Service: Cardiovascular;; 09/18/2019: UPPER EXTREMITY ANGIOGRAPHY; Left     Comment:  Procedure: UPPER EXTREMITY ANGIOGRAPHY;  Surgeon:               Katha Cabal, MD;  Location: [place] [facility];               Service: Cardiovascular;  Laterality: Left;  BMI    Body Mass Index: 47.47 kg/m      Reproductive/Obstetrics negative OB ROS                             Anesthesia Physical  Anesthesia Plan  ASA: IV  Anesthesia Plan: General   Post-op Pain Management:    Induction: Intravenous  PONV Risk Score and Plan: 2 and Ondansetron, Dexamethasone, Treatment may vary due to age or medical condition and Midazolam  Airway Management Planned: Natural Airway and Nasal CPAP  Additional Equipment: None  Intra-op Plan:   Post-operative Plan: Extubation in OR  Informed Consent: I have reviewed the patients History and Physical,  chart, labs and discussed the procedure including the risks, benefits and alternatives for the proposed anesthesia with the patient or authorized representative who has indicated his/her understanding and acceptance.     Dental Advisory Given  Plan  Discussed with: Anesthesiologist, CRNA and Surgeon  Anesthesia Plan Comments: (Discussed risks of anesthesia with patient, including possibility of difficulty with spontaneous ventilation under anesthesia necessitating airway intervention, PONV, and rare risks such as cardiac or respiratory or neurological events. Patient understands. Patient informed about increased incidence of above perioperative risk due to high BMI. Patient understands.  Will obtain stat potassium level given 3 days since last dialysis prior to proceeding)        Anesthesia Quick Evaluation

## 2020-05-12 NOTE — H&P (Signed)
Outpatient short stay form Pre-procedure 05/12/2020 8:40 AM Shila Kruczek K. Alice Reichert, M.D.  Primary Physician: Thereasa Distance, M.D.  Reason for visit:  Colon cancer screening  History of present illness:  49 y/o male with personal history of end stage renal disease on hemodialysis presents for colon cancer screening. He believes his mother "might" have had some colon polyps, not sure. Patient denies change in bowel habits, rectal bleeding, weight loss or abdominal pain.      Current Facility-Administered Medications:  .  0.9 %  sodium chloride infusion, , Intravenous, Continuous, Lynn, Benay Pike, MD, Last Rate: 20 mL/hr at 05/12/20 0759, 20 mL/hr at 05/12/20 0759  Medications Prior to Admission  Medication Sig Dispense Refill Last Dose  . allopurinol (ZYLOPRIM) 100 MG tablet Take 100 mg by mouth daily.   05/12/2020 at 0600  . amLODipine (NORVASC) 10 MG tablet Take 5 mg by mouth daily as needed (if bp is 190/90 or higher).    Past Week at Unknown time  . ascorbic acid (VITAMIN C) 500 MG tablet Take 500 mg by mouth 3 (three) times a week.   Past Week at Unknown time  . atenolol (TENORMIN) 50 MG tablet Take 25 mg by mouth daily as needed (if bp is 190/90 or higher).    Past Week at Unknown time  . B Complex-C-Folic Acid (RENA-VITE RX) 1 MG TABS Take 1 tablet by mouth 3 (three) times a week.    05/12/2020 at 0600  . calcium acetate (PHOSLO) 667 MG capsule Take 1,334-2,001 mg by mouth See admin instructions. Take 2001 mg by mouth with each meal & take 1334 mg by mouth with each snack.   Past Week at Unknown time  . cinacalcet (SENSIPAR) 30 MG tablet Take 30 mg by mouth daily.   05/12/2020 at 0600  . CINNAMON PO Take 500 mg by mouth 2 (two) times daily.    05/12/2020 at 0600  . DOCOSAHEXAENOIC ACID-EPA PO Take by mouth.   Past Week at Unknown time  . ibuprofen (ADVIL) 200 MG tablet Take 200 mg by mouth every 6 (six) hours as needed for headache or moderate pain.   Past Week at Unknown time  . Insulin  Glargine (LANTUS SOLOSTAR) 100 UNIT/ML Solostar Pen Inject 15 Units into the skin at bedtime.    05/11/2020 at Unknown time  . lidocaine-prilocaine (EMLA) cream Apply 1 application topically as needed (port access).   Past Week at Unknown time  . loratadine (CLARITIN) 10 MG tablet Take 10 mg by mouth daily as needed for allergies.   Past Week at Unknown time  . melatonin 5 MG TABS Take 5 mg by mouth at bedtime as needed (sleep).    Past Week at Unknown time  . metolazone (ZAROXOLYN) 5 MG tablet Take 5 mg by mouth every Monday, Wednesday, and Friday.   Past Week at Unknown time  . Multiple Vitamin (MULTIVITAMIN WITH MINERALS) TABS tablet Take 1 tablet by mouth 3 (three) times a week. Men's Multivitamin   Past Week at Unknown time  . Multiple Vitamins-Minerals (EYE HEALTH PO) Take 1 capsule by mouth daily.    Past Week at Unknown time  . Omega 3-6-9 Fatty Acids (OMEGA-3-6-9 PO) Take 1 capsule by mouth 3 (three) times a week.   Past Week at Unknown time  . Polyethyl Glycol-Propyl Glycol (SYSTANE OP) Place 1 drop into both eyes daily as needed (dry eyes).   05/11/2020 at Unknown time  . tadalafil (CIALIS) 10 MG tablet Take 10 mg by  mouth daily as needed for erectile dysfunction.   05/11/2020 at Unknown time  . warfarin (COUMADIN) 5 MG tablet Take 10 mg by mouth daily.    Past Week at Unknown time  . Testosterone 20.25 MG/ACT (1.62%) GEL Place 2 Pump onto the skin daily. 88 g 1      Allergies  Allergen Reactions  . Lisinopril Swelling    Lips mouth   . Furosemide Cough    Light headed, blurry vision, weakness.  . Adhesive [Tape] Itching     Past Medical History:  Diagnosis Date  . Anemia   . Chronic kidney disease    14% FUNCTION  . Diabetes mellitus without complication ([place])   . Diabetic retinopathy ([place])   . Dyspnea    DOE  . Dysrhythmia   . End stage renal disease ([place])   . History of orthopnea    error wrong patietn  . Hypercholesteremia    error  . Hyperkalemia    error wrong  patient  . Hypertension   . Long-term insulin use ([place])   . Lymphedema of leg   . Metabolic encephalopathy    error  . Microalbuminuria   . MSSA (methicillin susceptible Staphylococcus aureus)   . Obesity   . PVD (peripheral vascular disease) ([place])   . Respiratory failure ([place])    Error  . Sepsis ([place])    error  . Sleep apnea    no CPAP  . UTI (urinary tract infection)    error  . Venous ulcer ([place])   . Vitamin D deficiency     Review of systems:  Otherwise negative.    Physical Exam  Gen: Alert, oriented. Appears stated age.  HEENT: [place]/AT. PERRLA. Lungs: CTA, no wheezes. CV: RR nl S1, S2. Abd: soft, benign, no masses. BS+ Ext: No edema. Pulses 2+    Planned procedures: Proceed with colonoscopy. The patient understands the nature of the planned procedure, indications, risks, alternatives and potential complications including but not limited to bleeding, infection, perforation, damage to internal organs and possible oversedation/side effects from anesthesia. The patient agrees and gives consent to proceed.  Please refer to procedure notes for findings, recommendations and patient disposition/instructions.     Corlis Angelica K. Alice Reichert, M.D. Gastroenterology 05/12/2020  8:40 AM

## 2020-05-13 ENCOUNTER — Encounter: Payer: Self-pay | Admitting: Internal Medicine

## 2020-05-13 LAB — SURGICAL PATHOLOGY

## 2020-05-14 NOTE — Telephone Encounter (Signed)
Left detailed message on patient's voicemail as per his instruction on DPR.  Encouraged patient to return phone call if he has further questions.

## 2020-05-15 ENCOUNTER — Encounter (INDEPENDENT_AMBULATORY_CARE_PROVIDER_SITE_OTHER): Payer: Medicare Other

## 2020-05-15 ENCOUNTER — Ambulatory Visit (INDEPENDENT_AMBULATORY_CARE_PROVIDER_SITE_OTHER): Payer: Medicare Other | Admitting: Vascular Surgery

## 2020-05-20 ENCOUNTER — Telehealth: Payer: Self-pay | Admitting: *Deleted

## 2020-05-20 NOTE — Telephone Encounter (Signed)
Notified patient as instructed, Advised patient that the referral is in. Someone from there will be calling him

## 2020-06-03 ENCOUNTER — Encounter (INDEPENDENT_AMBULATORY_CARE_PROVIDER_SITE_OTHER): Payer: Self-pay | Admitting: Vascular Surgery

## 2020-06-03 ENCOUNTER — Telehealth (INDEPENDENT_AMBULATORY_CARE_PROVIDER_SITE_OTHER): Payer: Self-pay

## 2020-06-03 NOTE — Telephone Encounter (Signed)
That is fine.Marland KitchenMarland KitchenDestiny, can you note that the patient is able to return to work as of today, i'll come sign it

## 2020-06-03 NOTE — Telephone Encounter (Signed)
The pt called an left a message  On the nurses line wanting to know could he have a doctor note releasing him back to work he was last seen for end stage renal by Dr. Delana Meyer . Please advise.

## 2020-06-08 ENCOUNTER — Other Ambulatory Visit: Payer: Self-pay | Admitting: Urology

## 2020-06-10 ENCOUNTER — Other Ambulatory Visit: Payer: Self-pay

## 2020-06-10 ENCOUNTER — Ambulatory Visit (INDEPENDENT_AMBULATORY_CARE_PROVIDER_SITE_OTHER): Payer: Medicare Other | Admitting: Nurse Practitioner

## 2020-06-10 ENCOUNTER — Encounter (INDEPENDENT_AMBULATORY_CARE_PROVIDER_SITE_OTHER): Payer: Self-pay | Admitting: Nurse Practitioner

## 2020-06-10 ENCOUNTER — Ambulatory Visit (INDEPENDENT_AMBULATORY_CARE_PROVIDER_SITE_OTHER): Payer: Medicare Other

## 2020-06-10 VITALS — BP 139/82 | HR 79 | Ht 76.0 in | Wt 389.0 lb

## 2020-06-10 DIAGNOSIS — N186 End stage renal disease: Secondary | ICD-10-CM | POA: Diagnosis not present

## 2020-06-10 DIAGNOSIS — I10 Essential (primary) hypertension: Secondary | ICD-10-CM

## 2020-06-10 DIAGNOSIS — I890 Lymphedema, not elsewhere classified: Secondary | ICD-10-CM | POA: Diagnosis not present

## 2020-06-12 ENCOUNTER — Encounter (INDEPENDENT_AMBULATORY_CARE_PROVIDER_SITE_OTHER): Payer: Self-pay | Admitting: Nurse Practitioner

## 2020-06-12 NOTE — Progress Notes (Signed)
Subjective:    Patient ID: [name], male    DOB: 18-Feb-1971, 49 y.o.   MRN: [id number] Chief Complaint  Patient presents with  . Follow-up    diminshed arterial bruit/ pain HDA    The patient returns to the office for followup of their dialysis access. The function of the access has been stable. The patient denies increased bleeding time or increased recirculation. Patient denies difficulty with cannulation. The patient denies hand pain or other symptoms consistent with steal phenomena.  No significant arm swelling.  The patient states that they have had difficulty with hearing a bruit near the venous outflow portion of his graft.  The patient denies any pain during dialysis therefore when he is accessed.  The patient denies redness or swelling at the access site. The patient denies fever or chills at home or while on dialysis.  The patient denies amaurosis fugax or recent TIA symptoms. There are no recent neurological changes noted. The patient denies claudication symptoms or rest pain symptoms. The patient denies history of DVT, PE or superficial thrombophlebitis. The patient denies recent episodes of angina or shortness of breath.    Today the patient has a flow volume of 914.  Previously the patient had a flow volume of 978 on 03/06/2020.  There is evidence of a stenosis near the arterial anastomosis however this was present on the previous exam as well.  Overall the patient states that his dialysis access has not been having any issues.     Review of Systems  Cardiovascular: Positive for leg swelling.  All other systems reviewed and are negative.      Objective:   Physical Exam Vitals reviewed.  HENT:     Head: Normocephalic.  Cardiovascular:     Rate and Rhythm: Normal rate.     Pulses: Normal pulses.     Arteriovenous access: left arteriovenous access is present.    Comments: Left forearm loop graft, good thrill and bruit Pulmonary:     Effort: Pulmonary effort is  normal.  Musculoskeletal:     Right lower leg: Edema present.     Left lower leg: Edema present.  Skin:    General: Skin is warm and dry.  Neurological:     Mental Status: He is alert and oriented to person, place, and time.  Psychiatric:        Mood and Affect: Mood normal.        Behavior: Behavior normal.        Thought Content: Thought content normal.        Judgment: Judgment normal.     BP 139/82   Pulse 79   Ht 6\' 4"  (1.93 m)   Wt (!) 389 lb (176.4 kg)   BMI 47.35 kg/m   Past Medical History:  Diagnosis Date  . Anemia   . Chronic kidney disease    14% FUNCTION  . Diabetes mellitus without complication ([place])   . Diabetic retinopathy ([place])   . Dyspnea    DOE  . Dysrhythmia   . End stage renal disease ([place])   . History of orthopnea    error wrong patietn  . Hypercholesteremia    error  . Hyperkalemia    error wrong patient  . Hypertension   . Long-term insulin use ([place])   . Lymphedema of leg   . Metabolic encephalopathy    error  . Microalbuminuria   . MSSA (methicillin susceptible Staphylococcus aureus)   . Obesity   .  PVD (peripheral vascular disease) ([place])   . Respiratory failure ([place])    Error  . Sepsis ([place])    error  . Sleep apnea    no CPAP  . UTI (urinary tract infection)    error  . Venous ulcer ([place])   . Vitamin D deficiency     Social History   Socioeconomic History  . Marital status: Divorced    Spouse name: Not on file  . Number of children: Not on file  . Years of education: Not on file  . Highest education level: Not on file  Occupational History  . Not on file  Tobacco Use  . Smoking status: Former Smoker    Types: Cigars  . Smokeless tobacco: Former Systems developer    Quit date: 09/30/2017  . Tobacco comment: occasional  Vaping Use  . Vaping Use: Never used  Substance and Sexual Activity  . Alcohol use: Yes    Alcohol/week: 1.0 standard drink    Types: 1 Cans of beer per week    Comment: beer  . Drug use: Not Currently  .  Sexual activity: Not on file  Other Topics Concern  . Not on file  Social History Narrative  . Not on file   Social Determinants of Health   Financial Resource Strain:   . Difficulty of Paying Living Expenses: Not on file  Food Insecurity:   . Worried About Charity fundraiser in the Last Year: Not on file  . Ran Out of Food in the Last Year: Not on file  Transportation Needs:   . Lack of Transportation (Medical): Not on file  . Lack of Transportation (Non-Medical): Not on file  Physical Activity:   . Days of Exercise per Week: Not on file  . Minutes of Exercise per Session: Not on file  Stress:   . Feeling of Stress : Not on file  Social Connections:   . Frequency of Communication with Friends and Family: Not on file  . Frequency of Social Gatherings with Friends and Family: Not on file  . Attends Religious Services: Not on file  . Active Member of Clubs or Organizations: Not on file  . Attends Archivist Meetings: Not on file  . Marital Status: Not on file  Intimate Partner Violence:   . Fear of Current or Ex-Partner: Not on file  . Emotionally Abused: Not on file  . Physically Abused: Not on file  . Sexually Abused: Not on file    Past Surgical History:  Procedure Laterality Date  . A/V FISTULAGRAM Right 10/24/2018   Procedure: A/V FISTULAGRAM;  Surgeon: Katha Cabal, MD;  Location: [facility];  Service: Cardiovascular;  Laterality: Right;  . A/V FISTULAGRAM Right 11/24/2018   Procedure: A/V FISTULAGRAM;  Surgeon: Katha Cabal, MD;  Location: [facility];  Service: Cardiovascular;  Laterality: Right;  . A/V FISTULAGRAM Left 07/31/2019   Procedure: A/V FISTULAGRAM;  Surgeon: Katha Cabal, MD;  Location: [facility];  Service: Cardiovascular;  Laterality: Left;  . A/V FISTULAGRAM Left 12/11/2019   Procedure: A/V FISTULAGRAM;  Surgeon: Katha Cabal, MD;  Location: [facility];  Service: Cardiovascular;   Laterality: Left;  . A/V SHUNT INTERVENTION Right 02/21/2019   Procedure: A/V SHUNT INTERVENTION;  Surgeon: Katha Cabal, MD;  Location: [facility];  Service: Cardiovascular;  Laterality: Right;  . APPLICATION OF WOUND VAC Left 10/03/2017   Procedure: APPLICATION OF WOUND VAC;  Surgeon:  Algernon Huxley, MD;  Location: [facility];  Service: General;  Laterality: Left;  . APPLICATION OF WOUND VAC Left 10/11/2017   Procedure: APPLICATION OF WOUND VAC;  Surgeon: Katha Cabal, MD;  Location: [facility];  Service: Vascular;  Laterality: Left;  . APPLICATION OF WOUND VAC Left 10/14/2017   Procedure: WOUND VAC CHANGE;  Surgeon: Katha Cabal, MD;  Location: [facility];  Service: Vascular;  Laterality: Left;  left lower leg  . APPLICATION OF WOUND VAC Left 10/18/2017   Procedure: WOUND VAC CHANGE;  Surgeon: Katha Cabal, MD;  Location: [facility];  Service: Vascular;  Laterality: Left;  . APPLICATION OF WOUND VAC Left 10/07/2017   Procedure: APPLICATION OF WOUND VAC;  Surgeon: Katha Cabal, MD;  Location: [facility];  Service: Vascular;  Laterality: Left;  . AV FISTULA INSERTION W/ RF MAGNETIC GUIDANCE Right 07/19/2018   Procedure: AV FISTULA INSERTION W/RF MAGNETIC GUIDANCE;  Surgeon: Katha Cabal, MD;  Location: [facility];  Service: Cardiovascular;  Laterality: Right;  . AV FISTULA PLACEMENT Left 05/11/2019   Procedure: ARTERIOVENOUS (AV) FISTULA CREATION (RADIOCEPHALIC );  Surgeon: Katha Cabal, MD;  Location: [facility];  Service: Vascular;  Laterality: Left;  . AV FISTULA PLACEMENT Left 02/20/2020   Procedure: INSERTION OF ARTERIOVENOUS (AV) GORE-TEX GRAFT LEFT ARM ( FOREARM LOOP );  Surgeon: Katha Cabal, MD;  Location: [facility];  Service: Vascular;  Laterality: Left;  . CATARACT EXTRACTION W/PHACO Left 11/01/2018   Procedure: CATARACT EXTRACTION PHACO AND INTRAOCULAR LENS PLACEMENT (IOC)-LEFT, DIABETIC-INSULIN DEPENDENT;  Surgeon: Eulogio Bear, MD;   Location: [facility];  Service: Ophthalmology;  Laterality: Left;  [place] 00:41.8 CDE 4.61 Fluid Pack Lot # [id number]  . CATARACT EXTRACTION W/PHACO Right 04/27/2019   Procedure: CATARACT EXTRACTION PHACO AND INTRAOCULAR LENS PLACEMENT (IOC);  Surgeon: Eulogio Bear, MD;  Location: [facility];  Service: Ophthalmology;  Laterality: Right;  [place] 00:34 CDE 1.97 FLUID PACK LOT # [id number]   . COLONOSCOPY WITH PROPOFOL N/A 05/12/2020   Procedure: COLONOSCOPY WITH PROPOFOL;  Surgeon: Toledo, Benay Pike, MD;  Location: [facility];  Service: Gastroenterology;  Laterality: N/A;  . DIALYSIS/PERMA CATHETER INSERTION N/A 02/23/2019   Procedure: DIALYSIS/PERMA CATHETER INSERTION;  Surgeon: Algernon Huxley, MD;  Location: [facility];  Service: Cardiovascular;  Laterality: N/A;  . DIALYSIS/PERMA CATHETER REMOVAL N/A 04/03/2020   Procedure: DIALYSIS/PERMA CATHETER REMOVAL;  Surgeon: Katha Cabal, MD;  Location: [facility];  Service: Cardiovascular;  Laterality: N/A;  . I & D EXTREMITY Left 10/11/2017   Procedure: IRRIGATION AND DEBRIDEMENT EXTREMITY;  Surgeon: Katha Cabal, MD;  Location: [facility];  Service: Vascular;  Laterality: Left;  . INCISION AND DRAINAGE ABSCESS Right 10/07/2017   Procedure: INCISION AND DRAINAGE ABSCESS;  Surgeon: Katha Cabal, MD;  Location: [facility];  Service: Vascular;  Laterality: Right;  . IRRIGATION AND DEBRIDEMENT ABSCESS Left 10/03/2017   Procedure: IRRIGATION AND DEBRIDEMENT ABSCESS with debridement of skin, soft tissue, muscle 50sq cm;  Surgeon: Algernon Huxley, MD;  Location: [facility];  Service: General;  Laterality: Left;  . TEMPORARY DIALYSIS CATHETER  02/20/2019   Procedure: TEMPORARY DIALYSIS CATHETER;  Surgeon: Katha Cabal, MD;  Location: [facility];  Service: Cardiovascular;;  . UPPER EXTREMITY ANGIOGRAPHY Left 09/18/2019   Procedure: UPPER EXTREMITY ANGIOGRAPHY;  Surgeon: Katha Cabal, MD;  Location: [facility];   Service: Cardiovascular;  Laterality: Left;    Family History  Problem  Relation Age of Onset  . Diabetes Father   . Kidney disease Father   . Diabetes Daughter     Allergies  Allergen Reactions  . Lisinopril Swelling    Lips mouth   . Furosemide Cough    Light headed, blurry vision, weakness.  . Adhesive [Tape] Itching       Assessment & Plan:   1. End stage renal disease ([place]) Recommend:  The patient is doing well and currently has adequate dialysis access. The patient's dialysis center is not reporting any access issues. Flow pattern is stable when compared to the prior ultrasound.  The patient should have a duplex ultrasound of the dialysis access in 6 months. The patient will follow-up with me in the office after each ultrasound     2. Primary hypertension Continue antihypertensive medications as already ordered, these medications have been reviewed and there are no changes at this time.   3. Lymphedema The patient is currently doing well with control of his lymphedema.  He is diligently wearing his document with compression stocking daily in addition to use of his lymphedema pump regular exercise and elevation.   Current Outpatient Medications on File Prior to Visit  Medication Sig Dispense Refill  . allopurinol (ZYLOPRIM) 100 MG tablet Take 100 mg by mouth daily.    [name] amLODipine (NORVASC) 10 MG tablet Take 5 mg by mouth daily as needed (if bp is 190/90 or higher).     [name] ascorbic acid (VITAMIN C) 500 MG tablet Take 500 mg by mouth 3 (three) times a week.    [name] atenolol (TENORMIN) 50 MG tablet Take 25 mg by mouth daily as needed (if bp is 190/90 or higher).     . B Complex-C-Folic Acid (RENA-VITE RX) 1 MG TABS Take 1 tablet by mouth 3 (three) times a week.     . calcium acetate (PHOSLO) 667 MG capsule Take 1,334-2,001 mg by mouth See admin instructions. Take 2001 mg by mouth with each meal & take 1334 mg by mouth with each snack.    . cinacalcet (SENSIPAR) 30 MG  tablet Take 30 mg by mouth daily.    [name] CINNAMON PO Take 500 mg by mouth 2 (two) times daily.     . DOCOSAHEXAENOIC ACID-EPA PO Take by mouth.    [name] GAVILYTE-C 240 g solution Take by mouth as directed.    [name] ibuprofen (ADVIL) 200 MG tablet Take 200 mg by mouth every 6 (six) hours as needed for headache or moderate pain.    . Insulin Glargine (LANTUS SOLOSTAR) 100 UNIT/ML Solostar Pen Inject 15 Units into the skin at bedtime.     . lidocaine-prilocaine (EMLA) cream Apply 1 application topically as needed (port access).    [name] loratadine (CLARITIN) 10 MG tablet Take 10 mg by mouth daily as needed for allergies.    . melatonin 5 MG TABS Take 5 mg by mouth at bedtime as needed (sleep).     . Multiple Vitamin (MULTIVITAMIN WITH MINERALS) TABS tablet Take 1 tablet by mouth 3 (three) times a week. Men's Multivitamin    . Multiple Vitamins-Minerals (EYE HEALTH PO) Take 1 capsule by mouth daily.     Ernestine Conrad 3-6-9 Fatty Acids (OMEGA-3-6-9 PO) Take 1 capsule by mouth 3 (three) times a week.    Vladimir Faster Glycol-Propyl Glycol (SYSTANE OP) Place 1 drop into both eyes daily as needed (dry eyes).    . polyethylene glycol-electrolytes (NULYTELY) 420 g solution Take as directed for colonic prep.    [name]  tadalafil (CIALIS) 10 MG tablet Take 10 mg by mouth daily as needed for erectile dysfunction.    . Testosterone 1.62 % GEL APPLY 2 PUMP ACTUATIONS (40.5 MG/2.5 GM) TOPICALLY ONCE DAILY IN THE MORNING TO THE SHOULDERS AND UPPER ARMS 75 g 1  . warfarin (COUMADIN) 5 MG tablet Take 10 mg by mouth daily.     . metolazone (ZAROXOLYN) 5 MG tablet Take 5 mg by mouth every Monday, Wednesday, and Friday. (Patient not taking: Reported on 06/10/2020)     No current facility-administered medications on file prior to visit.    There are no Patient Instructions on file for this visit. No follow-ups on file.   Kris Hartmann, NP

## 2020-06-26 ENCOUNTER — Ambulatory Visit (INDEPENDENT_AMBULATORY_CARE_PROVIDER_SITE_OTHER): Payer: Medicare Other | Admitting: Vascular Surgery

## 2020-06-26 ENCOUNTER — Encounter (INDEPENDENT_AMBULATORY_CARE_PROVIDER_SITE_OTHER): Payer: Medicare Other

## 2020-07-03 ENCOUNTER — Other Ambulatory Visit: Payer: Self-pay

## 2020-07-03 ENCOUNTER — Ambulatory Visit (INDEPENDENT_AMBULATORY_CARE_PROVIDER_SITE_OTHER): Payer: Medicare Other | Admitting: Endocrinology

## 2020-07-03 ENCOUNTER — Encounter: Payer: Self-pay | Admitting: Endocrinology

## 2020-07-03 DIAGNOSIS — E221 Hyperprolactinemia: Secondary | ICD-10-CM

## 2020-07-03 MED ORDER — BROMOCRIPTINE MESYLATE 2.5 MG PO TABS: 1.2500 mg | ORAL_TABLET | Freq: Every day | ORAL | 5 refills | Status: DC

## 2020-07-03 MED ORDER — DEXAMETHASONE 1 MG PO TABS: 1.0000 mg | ORAL_TABLET | ORAL | 0 refills | Status: DC

## 2020-07-03 NOTE — Progress Notes (Signed)
Subjective:    Patient ID: [name], male    DOB: [birth year], 49 y.o.   MRN: [id number]  HPI Pt is referred by Zara Council, PA, for hyperprolactinemia.  he was noted to have an elevated prolactin in 2021.  Low testosterone was also noted then.  He was rx'ed androgel by [facility].  He has 2 biological children.  he denies the following: excessive exercise, opiates, antipsychotics, brain XRT, brain surgery, cirrhosis,  thyroid dz, and seizures.  Pt denies h/o infertility, seizures, zoster, brain injury, or chest wall injury.  He reports slight nausea.   Past Medical History:  Diagnosis Date  . Anemia   . Chronic kidney disease    14% FUNCTION  . Diabetes mellitus without complication ([place])   . Diabetic retinopathy ([place])   . Dyspnea    DOE  . Dysrhythmia   . End stage renal disease ([place])   . History of orthopnea    error wrong patietn  . Hypercholesteremia    error  . Hyperkalemia    error wrong patient  . Hypertension   . Long-term insulin use ([place])   . Lymphedema of leg   . Metabolic encephalopathy    error  . Microalbuminuria   . MSSA (methicillin susceptible Staphylococcus aureus)   . Obesity   . PVD (peripheral vascular disease) ([place])   . Respiratory failure ([place])    Error  . Sepsis ([place])    error  . Sleep apnea    no CPAP  . UTI (urinary tract infection)    error  . Venous ulcer ([place])   . Vitamin D deficiency     Past Surgical History:  Procedure Laterality Date  . A/V FISTULAGRAM Right 10/24/2018   Procedure: A/V FISTULAGRAM;  Surgeon: Katha Cabal, MD;  Location: [facility];  Service: Cardiovascular;  Laterality: Right;  . A/V FISTULAGRAM Right 11/24/2018   Procedure: A/V FISTULAGRAM;  Surgeon: Katha Cabal, MD;  Location: [facility];  Service: Cardiovascular;  Laterality: Right;  . A/V FISTULAGRAM Left 07/31/2019   Procedure: A/V FISTULAGRAM;  Surgeon: Katha Cabal, MD;  Location: [facility];  Service:  Cardiovascular;  Laterality: Left;  . A/V FISTULAGRAM Left 12/11/2019   Procedure: A/V FISTULAGRAM;  Surgeon: Katha Cabal, MD;  Location: [facility];  Service: Cardiovascular;  Laterality: Left;  . A/V SHUNT INTERVENTION Right 02/21/2019   Procedure: A/V SHUNT INTERVENTION;  Surgeon: Katha Cabal, MD;  Location: [facility];  Service: Cardiovascular;  Laterality: Right;  . APPLICATION OF WOUND VAC Left 10/03/2017   Procedure: APPLICATION OF WOUND VAC;  Surgeon: Algernon Huxley, MD;  Location: [facility];  Service: General;  Laterality: Left;  . APPLICATION OF WOUND VAC Left 10/11/2017   Procedure: APPLICATION OF WOUND VAC;  Surgeon: Katha Cabal, MD;  Location: [facility];  Service: Vascular;  Laterality: Left;  . APPLICATION OF WOUND VAC Left 10/14/2017   Procedure: WOUND VAC CHANGE;  Surgeon: Katha Cabal, MD;  Location: [facility];  Service: Vascular;  Laterality: Left;  left lower leg  . APPLICATION OF WOUND VAC Left 10/18/2017   Procedure: WOUND VAC CHANGE;  Surgeon: Katha Cabal, MD;  Location: [facility];  Service: Vascular;  Laterality: Left;  . APPLICATION OF WOUND VAC Left 10/07/2017   Procedure: APPLICATION OF WOUND VAC;  Surgeon: Katha Cabal, MD;  Location: [facility];  Service: Vascular;  Laterality: Left;  . AV FISTULA INSERTION  W/ RF MAGNETIC GUIDANCE Right 07/19/2018   Procedure: AV FISTULA INSERTION W/RF MAGNETIC GUIDANCE;  Surgeon: Katha Cabal, MD;  Location: [facility];  Service: Cardiovascular;  Laterality: Right;  . AV FISTULA PLACEMENT Left 05/11/2019   Procedure: ARTERIOVENOUS (AV) FISTULA CREATION (RADIOCEPHALIC );  Surgeon: Katha Cabal, MD;  Location: [facility];  Service: Vascular;  Laterality: Left;  . AV FISTULA PLACEMENT Left 02/20/2020   Procedure: INSERTION OF ARTERIOVENOUS (AV) GORE-TEX GRAFT LEFT ARM ( FOREARM LOOP );  Surgeon: Katha Cabal, MD;  Location: [facility];  Service: Vascular;  Laterality: Left;   . CATARACT EXTRACTION W/PHACO Left 11/01/2018   Procedure: CATARACT EXTRACTION PHACO AND INTRAOCULAR LENS PLACEMENT (IOC)-LEFT, DIABETIC-INSULIN DEPENDENT;  Surgeon: Eulogio Bear, MD;  Location: [facility];  Service: Ophthalmology;  Laterality: Left;  [place] 00:41.8 CDE 4.61 Fluid Pack Lot # [id number]  . CATARACT EXTRACTION W/PHACO Right 04/27/2019   Procedure: CATARACT EXTRACTION PHACO AND INTRAOCULAR LENS PLACEMENT (IOC);  Surgeon: Eulogio Bear, MD;  Location: [facility];  Service: Ophthalmology;  Laterality: Right;  [place] 00:34 CDE 1.97 FLUID PACK LOT # [id number]   . COLONOSCOPY WITH PROPOFOL N/A 05/12/2020   Procedure: COLONOSCOPY WITH PROPOFOL;  Surgeon: Toledo, Benay Pike, MD;  Location: [facility];  Service: Gastroenterology;  Laterality: N/A;  . DIALYSIS/PERMA CATHETER INSERTION N/A 02/23/2019   Procedure: DIALYSIS/PERMA CATHETER INSERTION;  Surgeon: Algernon Huxley, MD;  Location: [facility];  Service: Cardiovascular;  Laterality: N/A;  . DIALYSIS/PERMA CATHETER REMOVAL N/A 04/03/2020   Procedure: DIALYSIS/PERMA CATHETER REMOVAL;  Surgeon: Katha Cabal, MD;  Location: [facility];  Service: Cardiovascular;  Laterality: N/A;  . I & D EXTREMITY Left 10/11/2017   Procedure: IRRIGATION AND DEBRIDEMENT EXTREMITY;  Surgeon: Katha Cabal, MD;  Location: [facility];  Service: Vascular;  Laterality: Left;  . INCISION AND DRAINAGE ABSCESS Right 10/07/2017   Procedure: INCISION AND DRAINAGE ABSCESS;  Surgeon: Katha Cabal, MD;  Location: [facility];  Service: Vascular;  Laterality: Right;  . IRRIGATION AND DEBRIDEMENT ABSCESS Left 10/03/2017   Procedure: IRRIGATION AND DEBRIDEMENT ABSCESS with debridement of skin, soft tissue, muscle 50sq cm;  Surgeon: Algernon Huxley, MD;  Location: [facility];  Service: General;  Laterality: Left;  . TEMPORARY DIALYSIS CATHETER  02/20/2019   Procedure: TEMPORARY DIALYSIS CATHETER;  Surgeon: Katha Cabal, MD;  Location: [facility];  Service: Cardiovascular;;  . UPPER EXTREMITY ANGIOGRAPHY Left 09/18/2019   Procedure: UPPER EXTREMITY ANGIOGRAPHY;  Surgeon: Katha Cabal, MD;  Location: [facility];  Service: Cardiovascular;  Laterality: Left;    Social History   Socioeconomic History  . Marital status: Divorced    Spouse name: Not on file  . Number of children: Not on file  . Years of education: Not on file  . Highest education level: Not on file  Occupational History  . Not on file  Tobacco Use  . Smoking status: Former Smoker    Types: Cigars  . Smokeless tobacco: Former Systems developer    Quit date: 09/30/2017  . Tobacco comment: occasional  Vaping Use  . Vaping Use: Never used  Substance and Sexual Activity  . Alcohol use: Yes    Alcohol/week: 1.0 standard drink    Types: 1 Cans of beer per week    Comment: beer  . Drug use: Not Currently  . Sexual activity: Not on file  Other Topics Concern  . Not on file  Social History Narrative  .  Not on file   Social Determinants of Health   Financial Resource Strain:   . Difficulty of Paying Living Expenses: Not on file  Food Insecurity:   . Worried About Charity fundraiser in the Last Year: Not on file  . Ran Out of Food in the Last Year: Not on file  Transportation Needs:   . Lack of Transportation (Medical): Not on file  . Lack of Transportation (Non-Medical): Not on file  Physical Activity:   . Days of Exercise per Week: Not on file  . Minutes of Exercise per Session: Not on file  Stress:   . Feeling of Stress : Not on file  Social Connections:   . Frequency of Communication with Friends and Family: Not on file  . Frequency of Social Gatherings with Friends and Family: Not on file  . Attends Religious Services: Not on file  . Active Member of Clubs or Organizations: Not on file  . Attends Archivist Meetings: Not on file  . Marital Status: Not on file  Intimate Partner Violence:   . Fear of Current or Ex-Partner: Not on file   . Emotionally Abused: Not on file  . Physically Abused: Not on file  . Sexually Abused: Not on file    Current Outpatient Medications on File Prior to Visit  Medication Sig Dispense Refill  . allopurinol (ZYLOPRIM) 100 MG tablet Take 100 mg by mouth daily.    [name] amLODipine (NORVASC) 10 MG tablet Take 5 mg by mouth daily as needed (if bp is 190/90 or higher).     [name] ascorbic acid (VITAMIN C) 500 MG tablet Take 500 mg by mouth 3 (three) times a week.    [name] atenolol (TENORMIN) 50 MG tablet Take 25 mg by mouth daily as needed (if bp is 190/90 or higher).     . B Complex-C-Folic Acid (RENA-VITE RX) 1 MG TABS Take 1 tablet by mouth 3 (three) times a week.     . calcium acetate (PHOSLO) 667 MG capsule Take 1,334-2,001 mg by mouth See admin instructions. Take 2001 mg by mouth with each meal & take 1334 mg by mouth with each snack.    . cinacalcet (SENSIPAR) 30 MG tablet Take 30 mg by mouth daily.    [name] CINNAMON PO Take 500 mg by mouth 2 (two) times daily.     . DOCOSAHEXAENOIC ACID-EPA PO Take by mouth.    [name] GAVILYTE-C 240 g solution Take by mouth as directed.    [name] ibuprofen (ADVIL) 200 MG tablet Take 200 mg by mouth every 6 (six) hours as needed for headache or moderate pain.    . Insulin Glargine (LANTUS SOLOSTAR) 100 UNIT/ML Solostar Pen Inject 15 Units into the skin at bedtime.     . lidocaine-prilocaine (EMLA) cream Apply 1 application topically as needed (port access).    [name] loratadine (CLARITIN) 10 MG tablet Take 10 mg by mouth daily as needed for allergies.    . melatonin 5 MG TABS Take 5 mg by mouth at bedtime as needed (sleep).     . metolazone (ZAROXOLYN) 5 MG tablet Take 5 mg by mouth every Monday, Wednesday, and Friday.     . Multiple Vitamin (MULTIVITAMIN WITH MINERALS) TABS tablet Take 1 tablet by mouth 3 (three) times a week. Men's Multivitamin    . Multiple Vitamins-Minerals (EYE HEALTH PO) Take 1 capsule by mouth daily.     Ernestine Conrad 3-6-9 Fatty Acids (OMEGA-3-6-9 PO) Take 1 capsule by  mouth 3 (three) times a week.    Vladimir Faster Glycol-Propyl Glycol (SYSTANE OP) Place 1 drop into both eyes daily as needed (dry eyes).    . polyethylene glycol-electrolytes (NULYTELY) 420 g solution Take as directed for colonic prep.    . tadalafil (CIALIS) 10 MG tablet Take 10 mg by mouth daily as needed for erectile dysfunction.    . Testosterone 1.62 % GEL APPLY 2 PUMP ACTUATIONS (40.5 MG/2.5 GM) TOPICALLY ONCE DAILY IN THE MORNING TO THE SHOULDERS AND UPPER ARMS 75 g 1  . warfarin (COUMADIN) 5 MG tablet Take 10 mg by mouth daily.      No current facility-administered medications on file prior to visit.    Allergies  Allergen Reactions  . Lisinopril Swelling    Lips mouth   . Furosemide Cough    Light headed, blurry vision, weakness.  . Adhesive [Tape] Itching    Family History  Problem Relation Age of Onset  . Diabetes Father   . Kidney disease Father   . Diabetes Daughter     BP (!) 162/88   Pulse 77   Ht 6\' 4"  (1.93 m)   Wt (!) 389 lb (176.4 kg)   SpO2 95%   BMI 47.35 kg/m    Review of Systems denies polyuria, loss of smell, headache, visual loss, weight change, and n/v.      Objective:   Physical Exam VITAL SIGNS:  See vs page GENERAL: no distress NECK: There is no palpable thyroid enlargement.  No thyroid nodule is palpable.  No palpable lymphadenopathy at the anterior neck. Breasts: bilat pseudogynecomastia SKIN: normal male hair pattern GENITALIA: Normal male testicles, scrotum, and penis EXT: lymphedema of the left leg.    MRI (2021): 6 mm focus of transient hypoenhancement within the inferior right aspect of the pituitary gland. This finding is suspicious for a pituitary microadenoma given the provided history. Additionally, there is subtle leftward deviation of the pituitary stalk.  I have reviewed outside records, and summarized: Pt was noted to have elevated prolactin, and referred here. He also had colonoscopy. He goes to HD.       Assessment  & Plan:  HTN: is noted today Hyperprolactinemia, new to me Hypogonadism, poss due to the above.   Patient Instructions  Your blood pressure is high today.  Please follow this up at dialysis. I have sent a prescription to your pharmacy, to lower the prolactin. Please reduce the androgel to 1 pump per day.   In 1 month, please do a "dexamethasone suppression test."  For this, you would take dexamethasone 1 mg at 10 pm (I have sent a prescription to your pharmacy), then come in for a "cortisol" blood test the next morning before 9 am.  You do not need to be fasting for this test.  Please do at [facility]: [address] [place], [place], [place] Please come back for a follow-up appointment in 6 months.

## 2020-07-03 NOTE — Patient Instructions (Addendum)
Your blood pressure is high today.  Please follow this up at dialysis. I have sent a prescription to your pharmacy, to lower the prolactin. Please reduce the androgel to 1 pump per day.   In 1 month, please do a "dexamethasone suppression test."  For this, you would take dexamethasone 1 mg at 10 pm (I have sent a prescription to your pharmacy), then come in for a "cortisol" blood test the next morning before 9 am.  You do not need to be fasting for this test.  Please do at [facility]: [phone] [place], [place], [place] Please come back for a follow-up appointment in 6 months.

## 2020-08-04 ENCOUNTER — Other Ambulatory Visit (INDEPENDENT_AMBULATORY_CARE_PROVIDER_SITE_OTHER): Payer: Medicare Other

## 2020-08-04 ENCOUNTER — Other Ambulatory Visit: Payer: Self-pay

## 2020-08-04 DIAGNOSIS — E221 Hyperprolactinemia: Secondary | ICD-10-CM | POA: Diagnosis not present

## 2020-08-04 NOTE — Addendum Note (Signed)
Addended by: Tor Netters I on: 08/04/2020 08:20 AM   Modules accepted: Orders

## 2020-08-04 NOTE — Addendum Note (Signed)
Addended by: Tor Netters I on: 08/04/2020 08:18 AM   Modules accepted: Orders

## 2020-08-05 LAB — TESTOSTERONE,FREE AND TOTAL
Testosterone, Free: 15.9 pg/mL (ref 7.2–24.0)
Testosterone, Free: 18.4 pg/mL (ref 7.2–24.0)
Testosterone: 264 ng/dL (ref 264–916)
Testosterone: 266 ng/dL (ref 264–916)

## 2020-08-05 LAB — TSH: TSH: 1.75 u[IU]/mL (ref 0.450–4.500)

## 2020-08-05 LAB — CORTISOL: Cortisol: 0.8 ug/dL

## 2020-08-05 LAB — T4, FREE: Free T4: 1 ng/dL (ref 0.82–1.77)

## 2020-08-05 LAB — INSULIN-LIKE GROWTH FACTOR: Insulin-Like GF-1: 240 ng/mL (ref 81–263)

## 2020-08-05 LAB — LUTEINIZING HORMONE: LH: 3 m[IU]/mL (ref 1.7–8.6)

## 2020-08-05 LAB — FOLLICLE STIMULATING HORMONE: FSH: 2 m[IU]/mL (ref 1.5–12.4)

## 2020-08-05 LAB — PROLACTIN: Prolactin: 13.3 ng/mL (ref 4.0–15.2)

## 2020-08-07 ENCOUNTER — Other Ambulatory Visit: Payer: Self-pay | Admitting: Endocrinology

## 2020-08-07 MED ORDER — BROMOCRIPTINE MESYLATE 2.5 MG PO TABS: 2.5000 mg | ORAL_TABLET | Freq: Every day | ORAL | 3 refills | Status: DC

## 2020-08-08 ENCOUNTER — Telehealth: Payer: Self-pay | Admitting: *Deleted

## 2020-08-08 NOTE — Telephone Encounter (Signed)
-----   Message from Renato Shin, MD sent at 08/07/2020 11:49 AM EST ----- please contact patient: Prolactin is borderline high.  I have sent a prescription to your pharmacy, to increase to a whole pill each day.  This increase might replace the testosterone, too.  The rest of the blood tests are normal.  I'll see you next time.

## 2020-08-08 NOTE — Telephone Encounter (Signed)
Called patient to relay results message and prescription sent to pharmacy. Call went to voicemail.  RN asked patient to call back [phone]. Landis Gandy, RN

## 2020-08-25 ENCOUNTER — Other Ambulatory Visit: Payer: Self-pay | Admitting: Urology

## 2020-08-30 DIAGNOSIS — Z992 Dependence on renal dialysis: Secondary | ICD-10-CM | POA: Diagnosis not present

## 2020-08-30 DIAGNOSIS — N186 End stage renal disease: Secondary | ICD-10-CM | POA: Diagnosis not present

## 2020-09-02 DIAGNOSIS — N186 End stage renal disease: Secondary | ICD-10-CM | POA: Diagnosis not present

## 2020-09-02 DIAGNOSIS — Z7901 Long term (current) use of anticoagulants: Secondary | ICD-10-CM | POA: Diagnosis not present

## 2020-09-02 DIAGNOSIS — Z992 Dependence on renal dialysis: Secondary | ICD-10-CM | POA: Diagnosis not present

## 2020-09-04 DIAGNOSIS — Z992 Dependence on renal dialysis: Secondary | ICD-10-CM | POA: Diagnosis not present

## 2020-09-04 DIAGNOSIS — N186 End stage renal disease: Secondary | ICD-10-CM | POA: Diagnosis not present

## 2020-09-06 DIAGNOSIS — N186 End stage renal disease: Secondary | ICD-10-CM | POA: Diagnosis not present

## 2020-09-06 DIAGNOSIS — Z992 Dependence on renal dialysis: Secondary | ICD-10-CM | POA: Diagnosis not present

## 2020-09-09 DIAGNOSIS — N186 End stage renal disease: Secondary | ICD-10-CM | POA: Diagnosis not present

## 2020-09-09 DIAGNOSIS — Z992 Dependence on renal dialysis: Secondary | ICD-10-CM | POA: Diagnosis not present

## 2020-09-11 DIAGNOSIS — Z992 Dependence on renal dialysis: Secondary | ICD-10-CM | POA: Diagnosis not present

## 2020-09-11 DIAGNOSIS — N186 End stage renal disease: Secondary | ICD-10-CM | POA: Diagnosis not present

## 2020-09-11 IMAGING — US RIGHT UPPER EXTREMITY SOFT TISSUE ULTRASOUND LIMITED
1 series · 14 of 19 positions shown · non-contrast
Comparison: None

CLINICAL DATA: 48-year-old male with swelling at AV fistula site.
Status post recent declot.

EXAM:
ULTRASOUND RIGHT UPPER EXTREMITY LIMITED
TECHNIQUE: Ultrasound examination of the upper extremity soft tissues was
performed in the area of clinical concern.

[Series 1: right upper extremity soft tissue ultrasound limit · 0.10mm/px · 19 acquisitions, 14 frames shown]
[im 1/19]
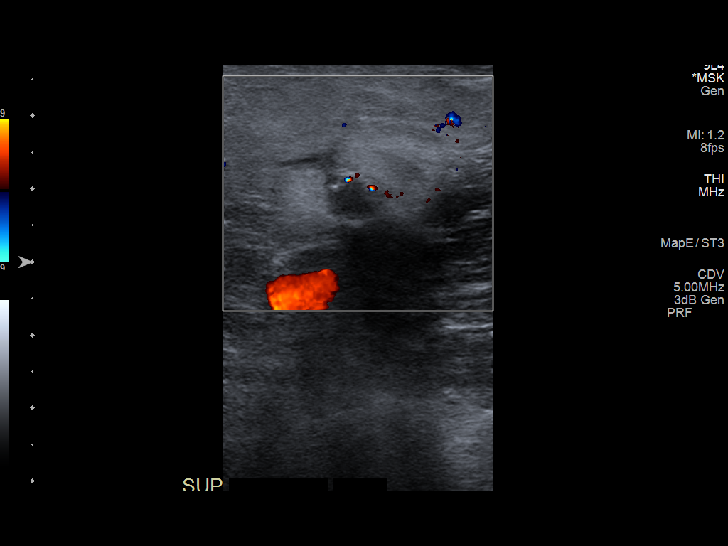
[im 3/19]
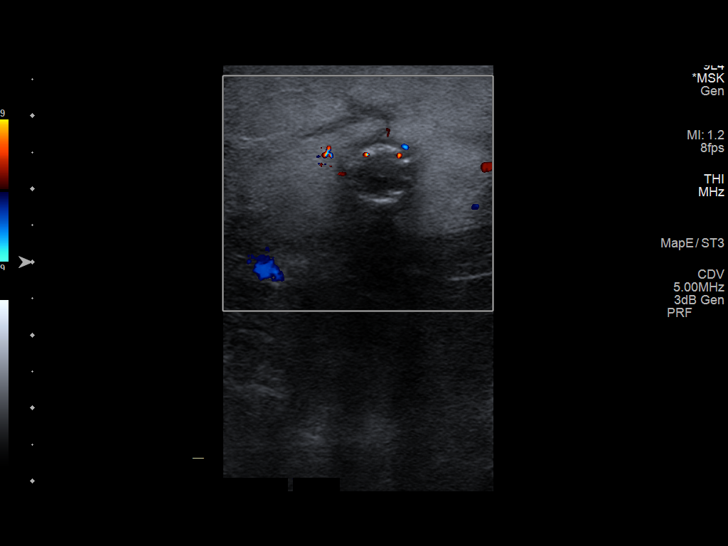
[im 4/19]
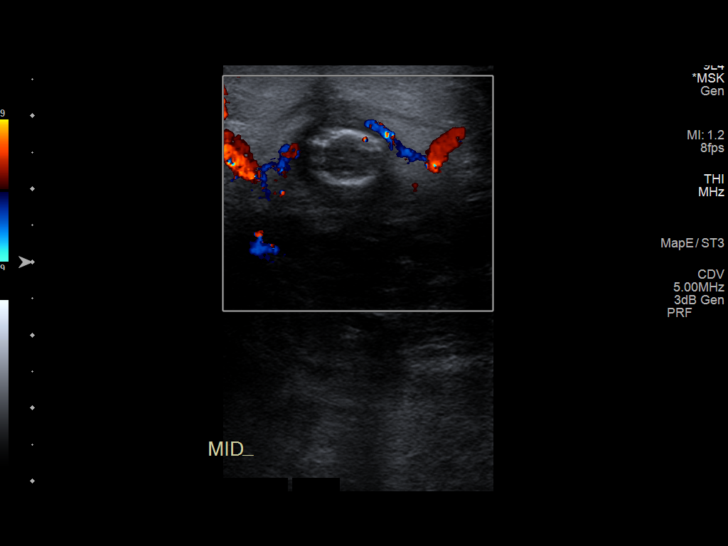
[im 5/19]
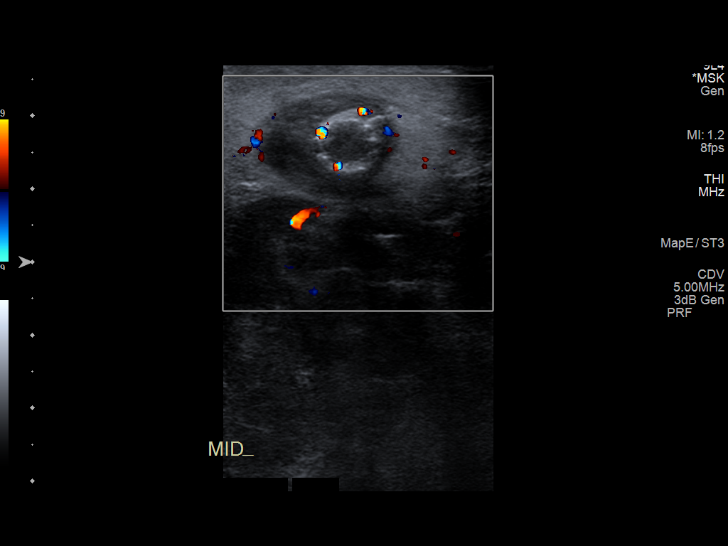
[im 7/19]
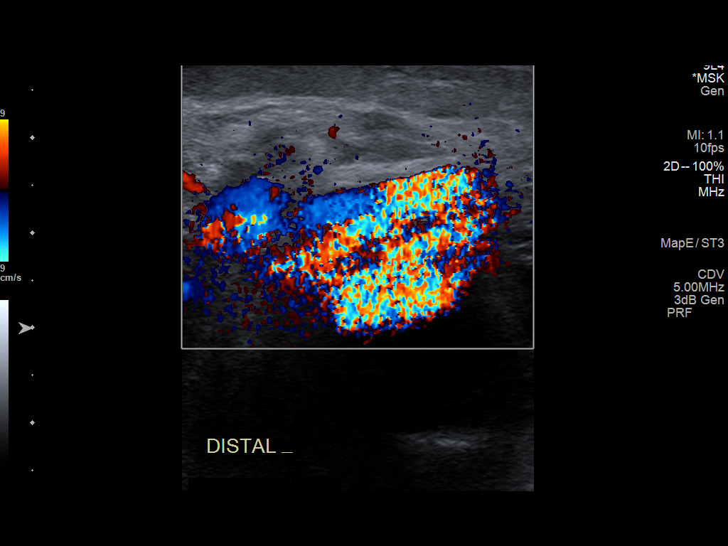
[im 8/19]
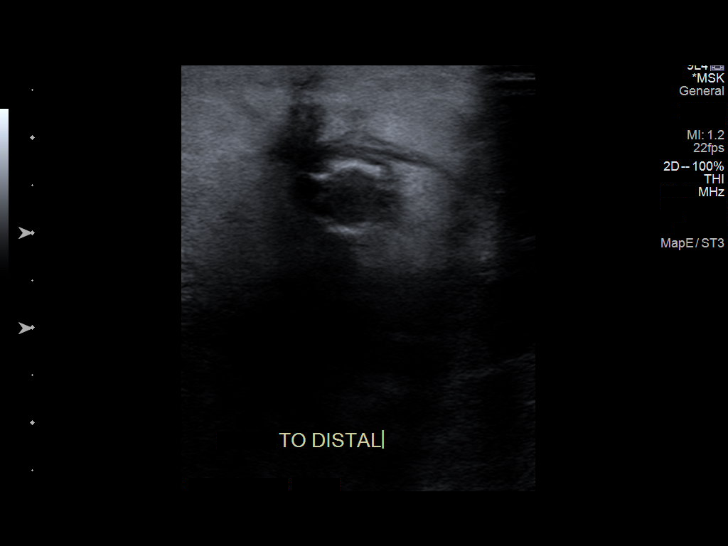
[im 9/19]
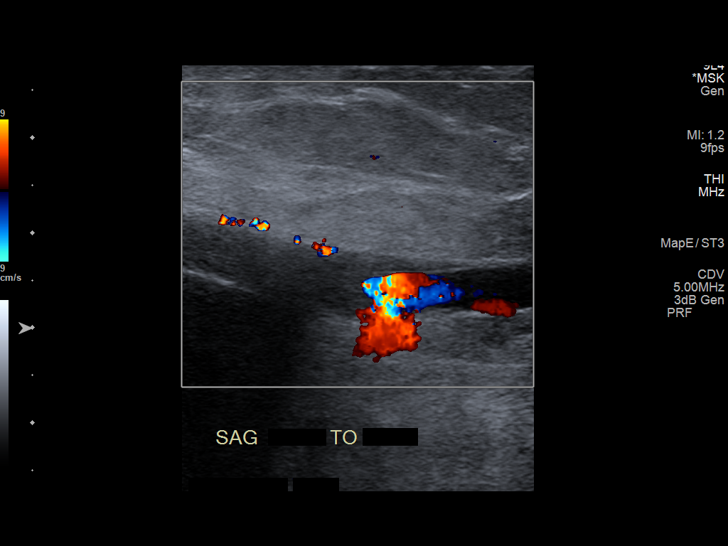
[im 11/19]
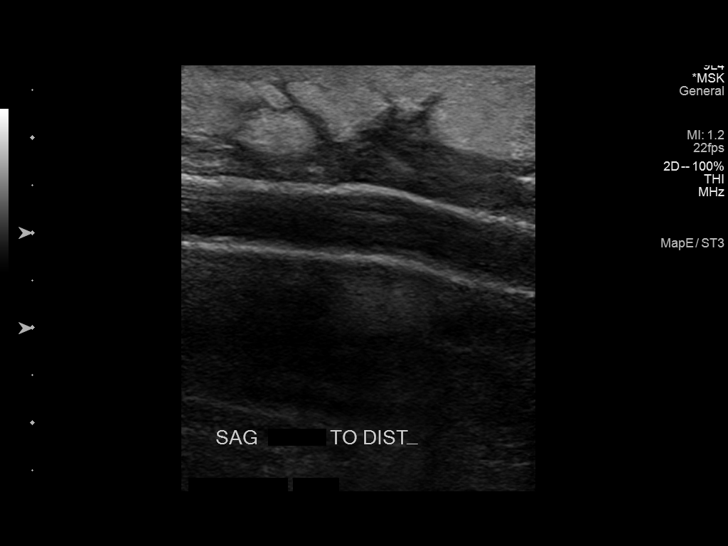
[im 12/19]
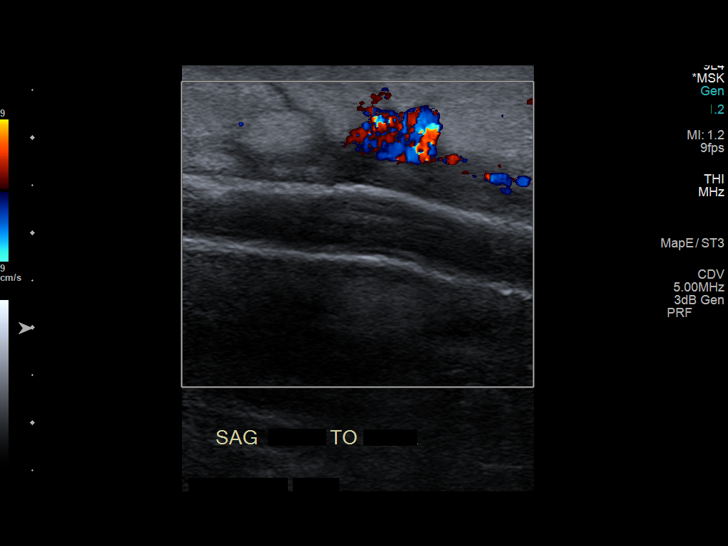
[im 13/19]
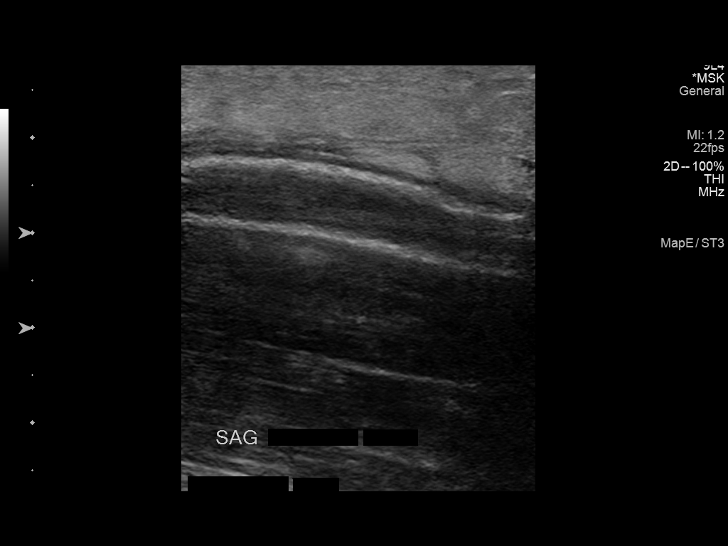
[im 15/19]
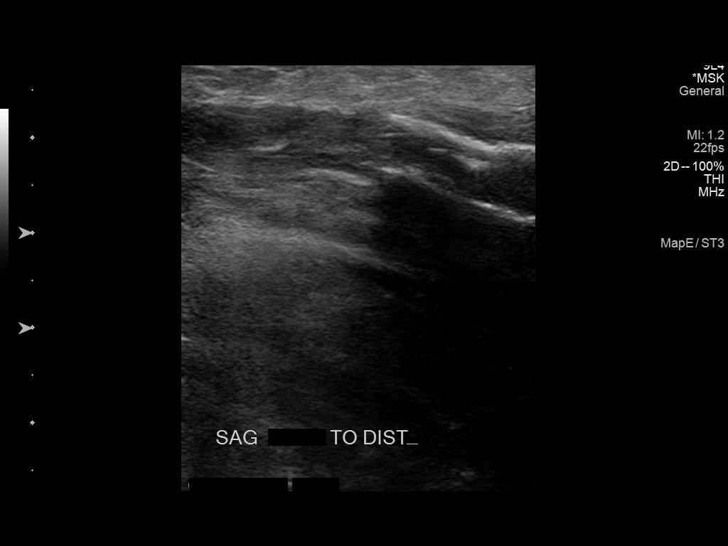
[im 16/19]
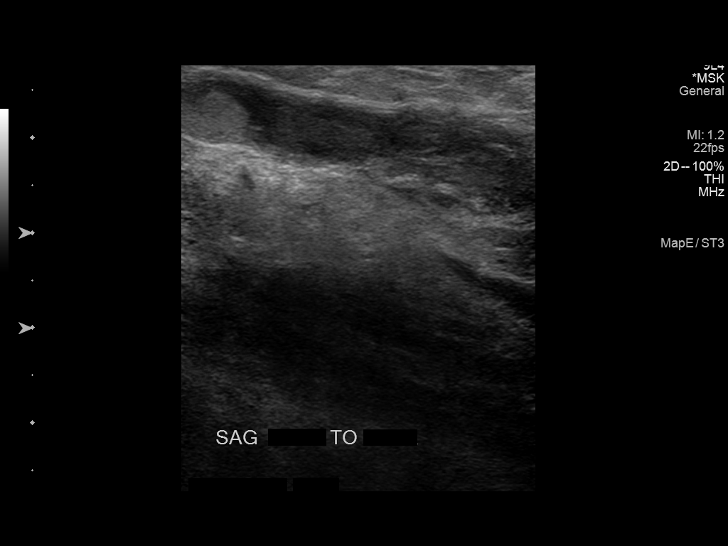
[im 17/19]
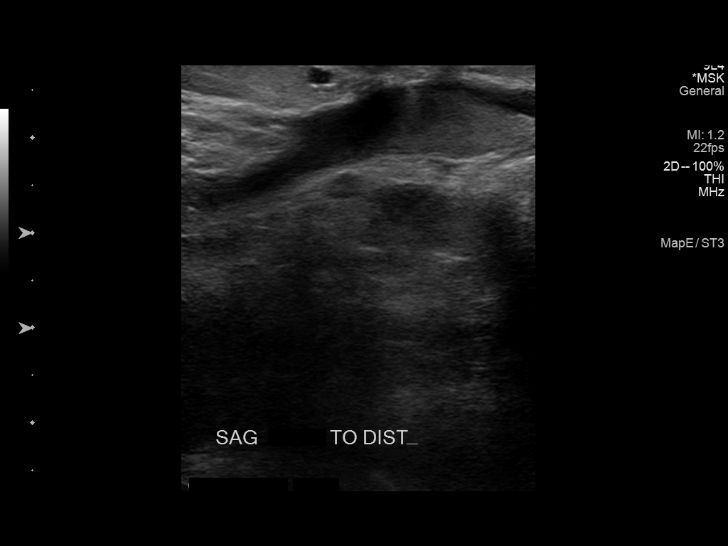
[im 19/19]
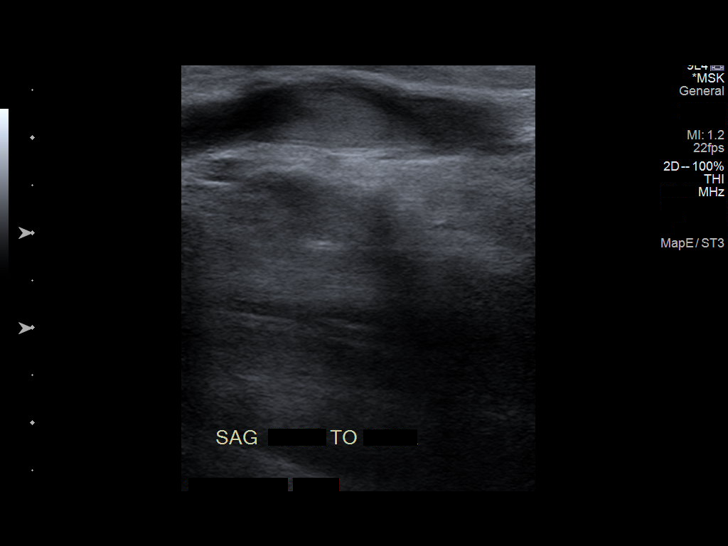

[14 of 19 positions shown; findings below may reference images not displayed]

FINDINGS: Focal sonographic interrogation of the region of clinical concern
demonstrates a metallic stent in the cephalic vein. The stent is
completely occluded. A small amount of heterogeneous hypoechoic
fluid is present surrounding the stented segment. The remainder of
the cephalic vein is also thrombosed.
IMPRESSION: 1. Recurrent thrombosis of AV fistula including the stented segment
of the cephalic vein.
2. There is a small amount of heterogeneous fluid surrounding the
stented segment of the cephalic vein. This may represent hematoma
related to recent intervention. However, infection could appear
similar in the appropriate clinical setting.

## 2020-09-13 DIAGNOSIS — Z992 Dependence on renal dialysis: Secondary | ICD-10-CM | POA: Diagnosis not present

## 2020-09-13 DIAGNOSIS — N186 End stage renal disease: Secondary | ICD-10-CM | POA: Diagnosis not present

## 2020-09-16 DIAGNOSIS — N186 End stage renal disease: Secondary | ICD-10-CM | POA: Diagnosis not present

## 2020-09-16 DIAGNOSIS — Z7901 Long term (current) use of anticoagulants: Secondary | ICD-10-CM | POA: Diagnosis not present

## 2020-09-16 DIAGNOSIS — Z992 Dependence on renal dialysis: Secondary | ICD-10-CM | POA: Diagnosis not present

## 2020-09-18 ENCOUNTER — Ambulatory Visit (INDEPENDENT_AMBULATORY_CARE_PROVIDER_SITE_OTHER): Payer: Medicare Other | Admitting: Vascular Surgery

## 2020-09-18 ENCOUNTER — Encounter (INDEPENDENT_AMBULATORY_CARE_PROVIDER_SITE_OTHER): Payer: Medicare Other

## 2020-09-18 DIAGNOSIS — N186 End stage renal disease: Secondary | ICD-10-CM | POA: Diagnosis not present

## 2020-09-18 DIAGNOSIS — Z992 Dependence on renal dialysis: Secondary | ICD-10-CM | POA: Diagnosis not present

## 2020-09-20 DIAGNOSIS — Z992 Dependence on renal dialysis: Secondary | ICD-10-CM | POA: Diagnosis not present

## 2020-09-20 DIAGNOSIS — N186 End stage renal disease: Secondary | ICD-10-CM | POA: Diagnosis not present

## 2020-09-21 DIAGNOSIS — Z992 Dependence on renal dialysis: Secondary | ICD-10-CM | POA: Diagnosis not present

## 2020-09-21 DIAGNOSIS — N186 End stage renal disease: Secondary | ICD-10-CM | POA: Diagnosis not present

## 2020-09-23 DIAGNOSIS — N186 End stage renal disease: Secondary | ICD-10-CM | POA: Diagnosis not present

## 2020-09-23 DIAGNOSIS — E119 Type 2 diabetes mellitus without complications: Secondary | ICD-10-CM | POA: Diagnosis not present

## 2020-09-23 DIAGNOSIS — Z794 Long term (current) use of insulin: Secondary | ICD-10-CM | POA: Diagnosis not present

## 2020-09-23 DIAGNOSIS — Z992 Dependence on renal dialysis: Secondary | ICD-10-CM | POA: Diagnosis not present

## 2020-09-23 DIAGNOSIS — Z7901 Long term (current) use of anticoagulants: Secondary | ICD-10-CM | POA: Diagnosis not present

## 2020-09-25 ENCOUNTER — Other Ambulatory Visit (INDEPENDENT_AMBULATORY_CARE_PROVIDER_SITE_OTHER): Payer: Self-pay | Admitting: Nurse Practitioner

## 2020-09-25 DIAGNOSIS — N186 End stage renal disease: Secondary | ICD-10-CM

## 2020-09-25 DIAGNOSIS — Z992 Dependence on renal dialysis: Secondary | ICD-10-CM | POA: Diagnosis not present

## 2020-09-25 DIAGNOSIS — T829XXS Unspecified complication of cardiac and vascular prosthetic device, implant and graft, sequela: Secondary | ICD-10-CM

## 2020-09-26 ENCOUNTER — Encounter (INDEPENDENT_AMBULATORY_CARE_PROVIDER_SITE_OTHER): Payer: Self-pay | Admitting: Nurse Practitioner

## 2020-09-26 ENCOUNTER — Ambulatory Visit (INDEPENDENT_AMBULATORY_CARE_PROVIDER_SITE_OTHER): Payer: Medicare HMO

## 2020-09-26 ENCOUNTER — Ambulatory Visit (INDEPENDENT_AMBULATORY_CARE_PROVIDER_SITE_OTHER): Payer: Medicare HMO | Admitting: Nurse Practitioner

## 2020-09-26 ENCOUNTER — Other Ambulatory Visit: Payer: Self-pay

## 2020-09-26 VITALS — BP 162/78 | HR 73 | Ht 76.0 in | Wt 395.0 lb

## 2020-09-26 DIAGNOSIS — I890 Lymphedema, not elsewhere classified: Secondary | ICD-10-CM

## 2020-09-26 DIAGNOSIS — E119 Type 2 diabetes mellitus without complications: Secondary | ICD-10-CM | POA: Diagnosis not present

## 2020-09-26 DIAGNOSIS — N186 End stage renal disease: Secondary | ICD-10-CM | POA: Diagnosis not present

## 2020-09-26 DIAGNOSIS — I10 Essential (primary) hypertension: Secondary | ICD-10-CM

## 2020-09-26 DIAGNOSIS — T829XXS Unspecified complication of cardiac and vascular prosthetic device, implant and graft, sequela: Secondary | ICD-10-CM | POA: Diagnosis not present

## 2020-09-27 ENCOUNTER — Encounter (INDEPENDENT_AMBULATORY_CARE_PROVIDER_SITE_OTHER): Payer: Self-pay | Admitting: Nurse Practitioner

## 2020-09-27 DIAGNOSIS — Z992 Dependence on renal dialysis: Secondary | ICD-10-CM | POA: Diagnosis not present

## 2020-09-27 DIAGNOSIS — N186 End stage renal disease: Secondary | ICD-10-CM | POA: Diagnosis not present

## 2020-09-27 NOTE — Progress Notes (Signed)
Subjective:    Patient ID: [name], male    DOB: 05-02-1971, 50 y.o.   MRN: [id number] Chief Complaint  Patient presents with  . Follow-up    U/S    The patient presents today for evaluation of his dialysis access.  The patient notes that they have heard something unusual within the bruit of his forearm loop graft.  The patient does note that he has not had any issues currently with dialysis.  He notes that occasionally he has some swelling in his graft but notes that is more from unsuccessful stick attempts.  The swelling is not common.  He denies any significant bleeding from his access.  He denies any pain in his forearm or hand.  He denies missing any dialysis sessions he denies any significant alarms from the dialysis access.  Overall he notes that his dialysis function is doing good.  Today the patient has a flow volume of 863.  The left forearm loop graft has no abnormal findings there are elevated velocities at the arterial and venous anastomosis however this is consistent with the patient's previous studies.   Review of Systems  Cardiovascular: Positive for leg swelling.  All other systems reviewed and are negative.      Objective:   Physical Exam Vitals reviewed.  HENT:     Head: Normocephalic.  Cardiovascular:     Rate and Rhythm: Normal rate.     Pulses: Normal pulses.     Arteriovenous access: left arteriovenous access is present.    Comments: Good thrill and bruit in left forearm loop graft Pulmonary:     Effort: Pulmonary effort is normal.  Musculoskeletal:     Right lower leg: 1+ Edema present.     Left lower leg: 2+ Edema present.  Neurological:     Mental Status: He is alert and oriented to person, place, and time.  Psychiatric:        Mood and Affect: Mood normal.        Behavior: Behavior normal.        Thought Content: Thought content normal.        Judgment: Judgment normal.     BP (!) 162/78   Pulse 73   Ht 6\' 4"  (1.93 m)   Wt (!) 395 lb  (179.2 kg)   BMI 48.08 kg/m   Past Medical History:  Diagnosis Date  . Anemia   . Chronic kidney disease    14% FUNCTION  . Diabetes mellitus without complication ([place])   . Diabetic retinopathy ([place])   . Dyspnea    DOE  . Dysrhythmia   . End stage renal disease ([place])   . History of orthopnea    error wrong patietn  . Hypercholesteremia    error  . Hyperkalemia    error wrong patient  . Hypertension   . Long-term insulin use ([place])   . Lymphedema of leg   . Metabolic encephalopathy    error  . Microalbuminuria   . MSSA (methicillin susceptible Staphylococcus aureus)   . Obesity   . PVD (peripheral vascular disease) ([place])   . Respiratory failure ([place])    Error  . Sepsis ([place])    error  . Sleep apnea    no CPAP  . UTI (urinary tract infection)    error  . Venous ulcer ([place])   . Vitamin D deficiency     Social History   Socioeconomic History  . Marital status: Divorced    Spouse name:  Not on file  . Number of children: Not on file  . Years of education: Not on file  . Highest education level: Not on file  Occupational History  . Not on file  Tobacco Use  . Smoking status: Former Smoker    Types: Cigars  . Smokeless tobacco: Former Systems developer    Quit date: 09/30/2017  . Tobacco comment: occasional  Vaping Use  . Vaping Use: Never used  Substance and Sexual Activity  . Alcohol use: Yes    Alcohol/week: 1.0 standard drink    Types: 1 Cans of beer per week    Comment: beer  . Drug use: Not Currently  . Sexual activity: Not on file  Other Topics Concern  . Not on file  Social History Narrative  . Not on file   Social Determinants of Health   Financial Resource Strain: Not on file  Food Insecurity: Not on file  Transportation Needs: Not on file  Physical Activity: Not on file  Stress: Not on file  Social Connections: Not on file  Intimate Partner Violence: Not on file    Past Surgical History:  Procedure Laterality Date  . A/V FISTULAGRAM Right  10/24/2018   Procedure: A/V FISTULAGRAM;  Surgeon: Katha Cabal, MD;  Location: [facility];  Service: Cardiovascular;  Laterality: Right;  . A/V FISTULAGRAM Right 11/24/2018   Procedure: A/V FISTULAGRAM;  Surgeon: Katha Cabal, MD;  Location: [facility];  Service: Cardiovascular;  Laterality: Right;  . A/V FISTULAGRAM Left 07/31/2019   Procedure: A/V FISTULAGRAM;  Surgeon: Katha Cabal, MD;  Location: [facility];  Service: Cardiovascular;  Laterality: Left;  . A/V FISTULAGRAM Left 12/11/2019   Procedure: A/V FISTULAGRAM;  Surgeon: Katha Cabal, MD;  Location: [facility];  Service: Cardiovascular;  Laterality: Left;  . A/V SHUNT INTERVENTION Right 02/21/2019   Procedure: A/V SHUNT INTERVENTION;  Surgeon: Katha Cabal, MD;  Location: [facility];  Service: Cardiovascular;  Laterality: Right;  . APPLICATION OF WOUND VAC Left 10/03/2017   Procedure: APPLICATION OF WOUND VAC;  Surgeon: Algernon Huxley, MD;  Location: [facility];  Service: General;  Laterality: Left;  . APPLICATION OF WOUND VAC Left 10/11/2017   Procedure: APPLICATION OF WOUND VAC;  Surgeon: Katha Cabal, MD;  Location: [facility];  Service: Vascular;  Laterality: Left;  . APPLICATION OF WOUND VAC Left 10/14/2017   Procedure: WOUND VAC CHANGE;  Surgeon: Katha Cabal, MD;  Location: [facility];  Service: Vascular;  Laterality: Left;  left lower leg  . APPLICATION OF WOUND VAC Left 10/18/2017   Procedure: WOUND VAC CHANGE;  Surgeon: Katha Cabal, MD;  Location: [facility];  Service: Vascular;  Laterality: Left;  . APPLICATION OF WOUND VAC Left 10/07/2017   Procedure: APPLICATION OF WOUND VAC;  Surgeon: Katha Cabal, MD;  Location: [facility];  Service: Vascular;  Laterality: Left;  . AV FISTULA INSERTION W/ RF MAGNETIC GUIDANCE Right 07/19/2018   Procedure: AV FISTULA INSERTION W/RF MAGNETIC GUIDANCE;  Surgeon: Katha Cabal, MD;  Location: [facility];  Service: Cardiovascular;  Laterality: Right;  . AV FISTULA PLACEMENT Left 05/11/2019   Procedure: ARTERIOVENOUS (AV) FISTULA CREATION (RADIOCEPHALIC );  Surgeon: Katha Cabal, MD;  Location: [facility];  Service: Vascular;  Laterality: Left;  . AV FISTULA PLACEMENT Left 02/20/2020   Procedure: INSERTION OF ARTERIOVENOUS (AV) GORE-TEX GRAFT LEFT ARM ( FOREARM LOOP );  Surgeon: [name]  G, MD;  Location: [facility];  Service: Vascular;  Laterality: Left;  . CATARACT EXTRACTION W/PHACO Left 11/01/2018   Procedure: CATARACT EXTRACTION PHACO AND INTRAOCULAR LENS PLACEMENT (IOC)-LEFT, DIABETIC-INSULIN DEPENDENT;  Surgeon: Eulogio Bear, MD;  Location: [facility];  Service: Ophthalmology;  Laterality: Left;  [place] 00:41.8 CDE 4.61 Fluid Pack Lot # [id number]  . CATARACT EXTRACTION W/PHACO Right 04/27/2019   Procedure: CATARACT EXTRACTION PHACO AND INTRAOCULAR LENS PLACEMENT (IOC);  Surgeon: Eulogio Bear, MD;  Location: [facility];  Service: Ophthalmology;  Laterality: Right;  [place] 00:34 CDE 1.97 FLUID PACK LOT # [id number]   . COLONOSCOPY WITH PROPOFOL N/A 05/12/2020   Procedure: COLONOSCOPY WITH PROPOFOL;  Surgeon: [place], Benay Pike, MD;  Location: [facility];  Service: Gastroenterology;  Laterality: N/A;  . DIALYSIS/PERMA CATHETER INSERTION N/A 02/23/2019   Procedure: DIALYSIS/PERMA CATHETER INSERTION;  Surgeon: Algernon Huxley, MD;  Location: [facility];  Service: Cardiovascular;  Laterality: N/A;  . DIALYSIS/PERMA CATHETER REMOVAL N/A 04/03/2020   Procedure: DIALYSIS/PERMA CATHETER REMOVAL;  Surgeon: Katha Cabal, MD;  Location: [facility];  Service: Cardiovascular;  Laterality: N/A;  . I & D EXTREMITY Left 10/11/2017   Procedure: IRRIGATION AND DEBRIDEMENT EXTREMITY;  Surgeon: Katha Cabal, MD;  Location: [facility];  Service: Vascular;  Laterality: Left;  . INCISION AND DRAINAGE ABSCESS Right 10/07/2017   Procedure: INCISION AND DRAINAGE ABSCESS;  Surgeon:  Katha Cabal, MD;  Location: [facility];  Service: Vascular;  Laterality: Right;  . IRRIGATION AND DEBRIDEMENT ABSCESS Left 10/03/2017   Procedure: IRRIGATION AND DEBRIDEMENT ABSCESS with debridement of skin, soft tissue, muscle 50sq cm;  Surgeon: Algernon Huxley, MD;  Location: [facility];  Service: General;  Laterality: Left;  . TEMPORARY DIALYSIS CATHETER  02/20/2019   Procedure: TEMPORARY DIALYSIS CATHETER;  Surgeon: Katha Cabal, MD;  Location: [facility];  Service: Cardiovascular;;  . UPPER EXTREMITY ANGIOGRAPHY Left 09/18/2019   Procedure: UPPER EXTREMITY ANGIOGRAPHY;  Surgeon: Katha Cabal, MD;  Location: [facility];  Service: Cardiovascular;  Laterality: Left;    Family History  Problem Relation Age of Onset  . Diabetes Father   . Kidney disease Father   . Diabetes Daughter     Allergies  Allergen Reactions  . Lisinopril Swelling    Lips mouth   . Furosemide Cough    Light headed, blurry vision, weakness.  . Adhesive [Tape] Itching    CBC Latest Ref Rng & Units 02/20/2020 02/19/2020 05/11/2019  WBC 4.0 - 10.5 K/uL - 6.0 -  Hemoglobin 13.0 - 17.0 g/dL 13.9 12.1(L) 14.3  Hematocrit 39.0 - 52.0 % 41.0 36.1(L) 42.0  Platelets 150 - 400 K/uL - 190 -      CMP     Component Value Date/Time   NA 140 02/20/2020 0650   K 5.1 05/12/2020 0842   CL 98 02/20/2020 0650   CO2 28 02/19/2020 1316   GLUCOSE 209 (H) 02/20/2020 0650   BUN 37 (H) 02/20/2020 0650   CREATININE 12.00 (H) 02/20/2020 0650   CALCIUM 8.5 (L) 02/19/2020 1316   PROT 7.4 02/22/2019 1636   ALBUMIN 3.9 02/22/2019 1636   ALBUMIN 3.8 02/22/2019 1636   AST 13 (L) 02/22/2019 1636   ALT 12 02/22/2019 1636   ALKPHOS 69 02/22/2019 1636   BILITOT 0.3 02/22/2019 1636   GFRNONAA 8 (L) 02/19/2020 1316   GFRAA 9 (L) 02/19/2020 1316     No results found.     Assessment &  Plan:   1. ESRD (end stage renal disease) ([place]) The patient has not had any significant changes in his noninvasive  studies.  He has a patent left forearm AV graft with no abnormal findings.  It is relatively close to the previous exam.  The sound heard in the patient's AV graft may be an additional bruit from his radiocephalic fistula.  Although this fistula is no longer functional there is still a faint bruit possible that may be causing the extra sound.  However overall the patient notes that there is no functional issues with his dialysis access currently.  As long as the patient continues to have no functional issues we will not plan on intervention.  Instead, we will plan on a closer follow-up of 3 months for reevaluation.  Otherwise the patient begins to have issues within the next few weeks, we will plan on a shuntogram.  Plan was discussed with patient and he is also in agreement with this plan.  2. Lymphedema Patient is doing great care in regards to his lymphedema.  He utilizes his lymphedema pump daily.  He utilizes medical grade 1 compression socks daily.  He has not had any further recurrence of ulcerations.  Patient has been able to resume work successfully as well and maintain control of his lymphedema.  3. Essential hypertension Continue antihypertensive medications as already ordered, these medications have been reviewed and there are no changes at this time.   4. Diabetes mellitus without complication ([place]) Continue hypoglycemic medications as already ordered, these medications have been reviewed and there are no changes at this time.  Hgb A1C to be monitored as already arranged by primary service    Current Outpatient Medications on File Prior to Visit  Medication Sig Dispense Refill  . allopurinol (ZYLOPRIM) 100 MG tablet Take 100 mg by mouth daily.    [name] amLODipine (NORVASC) 10 MG tablet Take 5 mg by mouth daily as needed (if bp is 190/90 or higher).     [name] ascorbic acid (VITAMIN C) 500 MG tablet Take 500 mg by mouth 3 (three) times a week.    [name] atenolol (TENORMIN) 50 MG tablet Take 25 mg  by mouth daily as needed (if bp is 190/90 or higher).     . B Complex-C-Folic Acid (RENA-VITE RX) 1 MG TABS Take 1 tablet by mouth 3 (three) times a week.     . bromocriptine (PARLODEL) 2.5 MG tablet Take 1 tablet (2.5 mg total) by mouth at bedtime. 90 tablet 3  . calcium acetate (PHOSLO) 667 MG capsule Take 1,334-2,001 mg by mouth See admin instructions. Take 2001 mg by mouth with each meal & take 1334 mg by mouth with each snack.    . cinacalcet (SENSIPAR) 30 MG tablet Take 30 mg by mouth daily.    [name] CINNAMON PO Take 500 mg by mouth 2 (two) times daily.     [name] dexamethasone (DECADRON) 1 MG tablet Take 1 tablet (1 mg total) by mouth See admin instructions. Take at 9-10 PM, the night before blood tests 1 tablet 0  . DOCOSAHEXAENOIC ACID-EPA PO Take by mouth.    [name] GAVILYTE-C 240 g solution Take by mouth as directed.    [name] ibuprofen (ADVIL) 200 MG tablet Take 200 mg by mouth every 6 (six) hours as needed for headache or moderate pain.    . Insulin Glargine (LANTUS SOLOSTAR) 100 UNIT/ML Solostar Pen Inject 15 Units into the skin at bedtime.     . lidocaine-prilocaine (EMLA) cream  Apply 1 application topically as needed (port access).    [name] loratadine (CLARITIN) 10 MG tablet Take 10 mg by mouth daily as needed for allergies.    . melatonin 5 MG TABS Take 5 mg by mouth at bedtime as needed (sleep).     . metolazone (ZAROXOLYN) 5 MG tablet Take 5 mg by mouth every Monday, Wednesday, and Friday.     . Multiple Vitamin (MULTIVITAMIN WITH MINERALS) TABS tablet Take 1 tablet by mouth 3 (three) times a week. Men's Multivitamin    . Multiple Vitamins-Minerals (EYE HEALTH PO) Take 1 capsule by mouth daily.     Ernestine Conrad 3-6-9 Fatty Acids (OMEGA-3-6-9 PO) Take 1 capsule by mouth 3 (three) times a week.    Vladimir Faster Glycol-Propyl Glycol (SYSTANE OP) Place 1 drop into both eyes daily as needed (dry eyes).    . polyethylene glycol-electrolytes (NULYTELY) 420 g solution Take as directed for colonic prep.    .  tadalafil (CIALIS) 10 MG tablet Take 10 mg by mouth daily as needed for erectile dysfunction.    . Testosterone 1.62 % GEL APPLY 1 PUMP ACTUATION TOPICALLY ONCE DAILY IN THE MORNING TO THE SHOULDERS AND UPPER ARMS. 75 g 0  . warfarin (COUMADIN) 5 MG tablet Take 10 mg by mouth daily.      No current facility-administered medications on file prior to visit.    There are no Patient Instructions on file for this visit. No follow-ups on file.   Kris Hartmann, NP

## 2020-09-28 NOTE — Progress Notes (Signed)
09/29/2020 11:49 AM   [name] 02-19-1971 [id number]  Referring provider: [name], MD [place],  [place] [address]  Chief Complaint  Patient presents with  . Follow-up    42mh follow-up    Urological history 1. Increased prolactin - pituitary MRI 04/2020 6 mm focus of transient hypoenhancement within the inferior right aspect of the pituitary gland. This finding is suspicious for a pituitary microadenoma given the provided history. Additionally, there is subtle leftward deviation of the pituitary stalk. - referred to endocrinology   2. Testosterone deficiency - testosterone level 264 in 07/2020 - managed with testosterone gel 1.62%, 1 pumps daily  3. ED - SHIM 9 - contributing factors of age, testosterone deficiency, DM, HTN, sleep apnea and ESRD - managed with tadalafil 5 mg every other day   HPI: [name] a 50y.o. male with ESRD on dialysis and untreated sleep apnea who presents today for for a 6 month follow up.  He just ended a relationship and is wondering if he should continue the tadalafil as it is not very effective.    Patient still having spontaneous erections.  He denies any pain or curvature with erections.    SHIM    Row Name 09/29/20 1102         SHIM: Over the last 6 months:   How do you rate your confidence that you could get and keep an erection? Very Low     When you had erections with sexual stimulation, how often were your erections hard enough for penetration (entering your partner)? A Few Times (much less than half the time)     During sexual intercourse, how often were you able to maintain your erection after you had penetrated (entered) your partner? A Few Times (much less than half the time)     During sexual intercourse, how difficult was it to maintain your erection to completion of intercourse? Very Difficult     When you attempted sexual intercourse, how often was it satisfactory for you? A Few Times  (much less than half the time)           SHIM Total Score   SHIM 9            Score: 1-7 Severe ED 8-11 Moderate ED 12-16 Mild-Moderate ED 17-21 Mild ED 22-25 No ED   PMH: Past Medical History:  Diagnosis Date  . Anemia   . Chronic kidney disease    14% FUNCTION  . Diabetes mellitus without complication ([facility]   . Diabetic retinopathy ([facility]   . Dyspnea    DOE  . Dysrhythmia   . End stage renal disease ([facility]   . History of orthopnea    error wrong patietn  . Hypercholesteremia    error  . Hyperkalemia    error wrong patient  . Hypertension   . Long-term insulin use ([facility]   . Lymphedema of leg   . Metabolic encephalopathy    error  . Microalbuminuria   . MSSA (methicillin susceptible Staphylococcus aureus)   . Obesity   . PVD (peripheral vascular disease) ([facility]   . Respiratory failure ([facility]    Error  . Sepsis ([facility]    error  . Sleep apnea    no CPAP  . UTI (urinary tract infection)    error  . Venous ulcer ([facility]   . Vitamin D deficiency     Surgical History: Past Surgical History:  Procedure Laterality Date  .  A/V FISTULAGRAM Right 10/24/2018   Procedure: A/V FISTULAGRAM;  Surgeon: Katha Cabal, MD;  Location: [facility];  Service: Cardiovascular;  Laterality: Right;  . A/V FISTULAGRAM Right 11/24/2018   Procedure: A/V FISTULAGRAM;  Surgeon: Katha Cabal, MD;  Location: [facility];  Service: Cardiovascular;  Laterality: Right;  . A/V FISTULAGRAM Left 07/31/2019   Procedure: A/V FISTULAGRAM;  Surgeon: Katha Cabal, MD;  Location: [facility];  Service: Cardiovascular;  Laterality: Left;  . A/V FISTULAGRAM Left 12/11/2019   Procedure: A/V FISTULAGRAM;  Surgeon: Katha Cabal, MD;  Location: [facility];  Service: Cardiovascular;  Laterality: Left;  . A/V SHUNT INTERVENTION Right 02/21/2019   Procedure: A/V SHUNT INTERVENTION;  Surgeon: Katha Cabal, MD;  Location: [facility];   Service: Cardiovascular;  Laterality: Right;  . APPLICATION OF WOUND VAC Left 10/03/2017   Procedure: APPLICATION OF WOUND VAC;  Surgeon: Algernon Huxley, MD;  Location: [facility];  Service: General;  Laterality: Left;  . APPLICATION OF WOUND VAC Left 10/11/2017   Procedure: APPLICATION OF WOUND VAC;  Surgeon: Katha Cabal, MD;  Location: [facility];  Service: Vascular;  Laterality: Left;  . APPLICATION OF WOUND VAC Left 10/14/2017   Procedure: WOUND VAC CHANGE;  Surgeon: Katha Cabal, MD;  Location: [facility];  Service: Vascular;  Laterality: Left;  left lower leg  . APPLICATION OF WOUND VAC Left 10/18/2017   Procedure: WOUND VAC CHANGE;  Surgeon: Katha Cabal, MD;  Location: [facility];  Service: Vascular;  Laterality: Left;  . APPLICATION OF WOUND VAC Left 10/07/2017   Procedure: APPLICATION OF WOUND VAC;  Surgeon: Katha Cabal, MD;  Location: [facility];  Service: Vascular;  Laterality: Left;  . AV FISTULA INSERTION W/ RF MAGNETIC GUIDANCE Right 07/19/2018   Procedure: AV FISTULA INSERTION W/RF MAGNETIC GUIDANCE;  Surgeon: Katha Cabal, MD;  Location: [facility];  Service: Cardiovascular;  Laterality: Right;  . AV FISTULA PLACEMENT Left 05/11/2019   Procedure: ARTERIOVENOUS (AV) FISTULA CREATION (RADIOCEPHALIC );  Surgeon: Katha Cabal, MD;  Location: [facility];  Service: Vascular;  Laterality: Left;  . AV FISTULA PLACEMENT Left 02/20/2020   Procedure: INSERTION OF ARTERIOVENOUS (AV) GORE-TEX GRAFT LEFT ARM ( FOREARM LOOP );  Surgeon: Katha Cabal, MD;  Location: [facility];  Service: Vascular;  Laterality: Left;  . CATARACT EXTRACTION W/PHACO Left 11/01/2018   Procedure: CATARACT EXTRACTION PHACO AND INTRAOCULAR LENS PLACEMENT (IOC)-LEFT, DIABETIC-INSULIN DEPENDENT;  Surgeon: Eulogio Bear, MD;  Location: [facility];  Service: Ophthalmology;  Laterality: Left;  [place] 00:41.8 CDE 4.61 Fluid Pack Lot # [id number]  . CATARACT EXTRACTION W/PHACO Right 04/27/2019    Procedure: CATARACT EXTRACTION PHACO AND INTRAOCULAR LENS PLACEMENT (IOC);  Surgeon: Eulogio Bear, MD;  Location: [facility];  Service: Ophthalmology;  Laterality: Right;  [place] 00:34 CDE 1.97 FLUID PACK LOT # [id number]   . COLONOSCOPY WITH PROPOFOL N/A 05/12/2020   Procedure: COLONOSCOPY WITH PROPOFOL;  Surgeon: Toledo, Benay Pike, MD;  Location: [facility];  Service: Gastroenterology;  Laterality: N/A;  . DIALYSIS/PERMA CATHETER INSERTION N/A 02/23/2019   Procedure: DIALYSIS/PERMA CATHETER INSERTION;  Surgeon: Algernon Huxley, MD;  Location: [facility];  Service: Cardiovascular;  Laterality: N/A;  . DIALYSIS/PERMA CATHETER REMOVAL N/A 04/03/2020   Procedure: DIALYSIS/PERMA CATHETER REMOVAL;  Surgeon: Katha Cabal, MD;  Location: [facility];  Service: Cardiovascular;  Laterality: N/A;  . I & D EXTREMITY  Left 10/11/2017   Procedure: IRRIGATION AND DEBRIDEMENT EXTREMITY;  Surgeon: Katha Cabal, MD;  Location: [facility];  Service: Vascular;  Laterality: Left;  . INCISION AND DRAINAGE ABSCESS Right 10/07/2017   Procedure: INCISION AND DRAINAGE ABSCESS;  Surgeon: Katha Cabal, MD;  Location: [facility];  Service: Vascular;  Laterality: Right;  . IRRIGATION AND DEBRIDEMENT ABSCESS Left 10/03/2017   Procedure: IRRIGATION AND DEBRIDEMENT ABSCESS with debridement of skin, soft tissue, muscle 50sq cm;  Surgeon: Algernon Huxley, MD;  Location: [facility];  Service: General;  Laterality: Left;  . TEMPORARY DIALYSIS CATHETER  02/20/2019   Procedure: TEMPORARY DIALYSIS CATHETER;  Surgeon: Katha Cabal, MD;  Location: [facility];  Service: Cardiovascular;;  . UPPER EXTREMITY ANGIOGRAPHY Left 09/18/2019   Procedure: UPPER EXTREMITY ANGIOGRAPHY;  Surgeon: Katha Cabal, MD;  Location: [facility];  Service: Cardiovascular;  Laterality: Left;    Home Medications:  Allergies as of 09/29/2020      Reactions   Lisinopril Swelling   Lips mouth    Furosemide Cough    Light headed, blurry vision, weakness.   Adhesive [tape] Itching      Medication List       Accurate as of September 29, 2020 11:49 AM. If you have any questions, ask your nurse or doctor.        STOP taking these medications   tadalafil 10 MG tablet Commonly known as: CIALIS Stopped by: Zara Council, PA-C     TAKE these medications   allopurinol 100 MG tablet Commonly known as: ZYLOPRIM Take 100 mg by mouth daily.   amLODipine 10 MG tablet Commonly known as: NORVASC Take 5 mg by mouth daily as needed (if bp is 190/90 or higher).   ascorbic acid 500 MG tablet Commonly known as: VITAMIN C Take 500 mg by mouth 3 (three) times a week.   atenolol 50 MG tablet Commonly known as: TENORMIN Take 25 mg by mouth daily as needed (if bp is 190/90 or higher).   bromocriptine 2.5 MG tablet Commonly known as: Parlodel Take 1 tablet (2.5 mg total) by mouth at bedtime.   calcium acetate 667 MG capsule Commonly known as: PHOSLO Take 1,334-2,001 mg by mouth See admin instructions. Take 2001 mg by mouth with each meal & take 1334 mg by mouth with each snack.   cinacalcet 30 MG tablet Commonly known as: SENSIPAR Take 30 mg by mouth daily.   CINNAMON PO Take 500 mg by mouth 2 (two) times daily.   dexamethasone 1 MG tablet Commonly known as: DECADRON Take 1 tablet (1 mg total) by mouth See admin instructions. Take at 9-10 PM, the night before blood tests   DOCOSAHEXAENOIC ACID-EPA PO Take by mouth.   EYE HEALTH PO Take 1 capsule by mouth daily.   GaviLyte-C 240 g solution Generic drug: polyethylene glycol Take by mouth as directed.   ibuprofen 200 MG tablet Commonly known as: ADVIL Take 200 mg by mouth every 6 (six) hours as needed for headache or moderate pain.   Lantus SoloStar 100 UNIT/ML Solostar Pen Generic drug: insulin glargine Inject 15 Units into the skin at bedtime.   lidocaine-prilocaine cream Commonly known as: EMLA Apply 1 application topically as  needed (port access).   loratadine 10 MG tablet Commonly known as: CLARITIN Take 10 mg by mouth daily as needed for allergies.   melatonin 5 MG Tabs Take 5 mg by mouth at bedtime as needed (sleep).   metolazone 5 MG  tablet Commonly known as: ZAROXOLYN Take 5 mg by mouth every Monday, Wednesday, and Friday.   multivitamin with minerals Tabs tablet Take 1 tablet by mouth 3 (three) times a week. Men's Multivitamin   OMEGA-3-6-9 PO Take 1 capsule by mouth 3 (three) times a week.   polyethylene glycol-electrolytes 420 g solution Commonly known as: NuLYTELY Take as directed for colonic prep.   Rena-Vite Rx 1 MG Tabs Take 1 tablet by mouth 3 (three) times a week.   sildenafil 25 MG tablet Commonly known as: VIAGRA Take 1 tablet (25 mg total) by mouth daily as needed for erectile dysfunction. Started by: Zara Council, PA-C   SYSTANE OP Place 1 drop into both eyes daily as needed (dry eyes).   Testosterone 1.62 % Gel APPLY 1 PUMP ACTUATION TOPICALLY ONCE DAILY IN THE MORNING TO THE SHOULDERS AND UPPER ARMS.   warfarin 5 MG tablet Commonly known as: COUMADIN Take 10 mg by mouth daily.       Allergies:  Allergies  Allergen Reactions  . Lisinopril Swelling    Lips mouth   . Furosemide Cough    Light headed, blurry vision, weakness.  . Adhesive [Tape] Itching    Family History: Family History  Problem Relation Age of Onset  . Diabetes Father   . Kidney disease Father   . Diabetes Daughter     Social History:  reports that he has quit smoking. His smoking use included cigars. He quit smokeless tobacco use about 3 years ago. He reports current alcohol use of about 1.0 standard drink of alcohol per week. He reports previous drug use.  ROS: Pertinent ROS in HPI  Physical Exam: BP (!) 144/69   Pulse (!) 58   Ht 6' 4"  (1.93 m)   Wt (!) 398 lb (180.5 kg)   BMI 48.45 kg/m   Constitutional:  Well nourished. Alert and oriented, No acute distress. HEENT: [place] AT,  mask in place.  Trachea midline Cardiovascular: No clubbing, cyanosis, or edema. Respiratory: Normal respiratory effort, no increased work of breathing. Neurologic: Grossly intact, no focal deficits, moving all 4 extremities. Psychiatric: Normal mood and affect.   Laboratory Data: Specimen:  Blood  Ref Range & Units 1 mo ago  Glucose 70 - 110 mg/dL 176High   Sodium 136 - 145 mmol/L 138   Potassium 3.6 - 5.1 mmol/L 5.0   Chloride 97 - 109 mmol/L 97   Carbon Dioxide (CO2) 22.0 - 32.0 mmol/L 27.7   Urea Nitrogen (BUN) 7 - 25 mg/dL 60High   Creatinine 0.7 - 1.3 mg/dL 15.5High   Glomerular Filtration Rate (eGFR), MDRD Estimate >60 mL/min/1.73sq m 4Low   Calcium 8.7 - 10.3 mg/dL 8.1Low   AST  8 - 39 U/L 14   ALT  6 - 57 U/L 15   Alk Phos (alkaline Phosphatase) 34 - 104 U/L 99   Albumin 3.5 - 4.8 g/dL 4.4   Bilirubin, Total 0.3 - 1.2 mg/dL 0.5   Protein, Total 6.1 - 7.9 g/dL 7.8   A/G Ratio 1.0 - 5.0 gm/dL 1.3   Resulting Agency  [facility]  Specimen Collected: 08/01/20 9:51 AM Last Resulted: 08/01/20 3:53 PM  Received From: [facility]  Result Received: 08/14/20 9:03 AM   Specimen:  Blood  Ref Range & Units 1 mo ago  Cholesterol, Total 100 - 200 mg/dL 162   Triglyceride 35 - 199 mg/dL 136   HDL (High Density Lipoprotein) Cholesterol 29.0 - 71.0 mg/dL 38.4  LDL Calculated 0 - 130 mg/dL 96   VLDL Cholesterol mg/dL 27   Cholesterol/HDL Ratio  4.2   [facility]  Specimen Collected: 08/01/20 9:51 AM Last Resulted: 08/01/20 3:53 PM  Received From: [facility]  Result Received: 08/14/20 9:03 AM   Specimen:  Blood  Ref Range & Units 1 mo ago  Hemoglobin A1C 4.2 - 5.6 % 9.3High   Average Blood Glucose (Calc) mg/dL 220   Resulting [facility]   Narrative  Normal Range:  4.2 - 5.6%  Increased Risk: 5.7 - 6.4%  Diabetes:    >= 6.5%  Glycemic Control  for adults with diabetes: <7%  Specimen Collected: 08/01/20 9:51 AM Last Resulted: 08/01/20 12:51 PM  Received From: [facility]  Result Received: 08/14/20 9:03 AM    Lab Results  Component Value Date   TESTOSTERONE 264 08/04/2020  I have reviewed the labs.   Pertinent Imaging: No recent imaging  Assessment & Plan:    1. Testosterone deficiency  Testosterone level is not therapeutic, but due to pituitary micro adenoma he needs to stay at his current dose   Continue AndroGel 1.62%, 1 pumps daily  2. Erectile dysfunction:    SHIM score is 9, it has worsened  Change to sildenafil 25 mg, # 30 tablets - prescription sent to [facility]  RTC in 6 months for SHIM score and exam, as testosterone therapy can affect erections  3. Elevated Prolactin/Pituitary microadenoma - managed by endocrinology    Return in about 6 months (around 03/29/2021) for SHIM, testosterone, PSA, HCT, HGB and testosterone .  These notes generated with voice recognition software. I apologize for typographical errors.  Zara Council, PA-C  [facility] [address]  [place], [place] [address] [phone]

## 2020-09-29 ENCOUNTER — Other Ambulatory Visit: Payer: Self-pay | Admitting: *Deleted

## 2020-09-29 ENCOUNTER — Encounter: Payer: Self-pay | Admitting: Urology

## 2020-09-29 ENCOUNTER — Other Ambulatory Visit
Admission: RE | Admit: 2020-09-29 | Discharge: 2020-09-29 | Disposition: A | Discharge status: Home / Self Care | Payer: Medicare HMO | Attending: Urology | Admitting: Urology

## 2020-09-29 ENCOUNTER — Other Ambulatory Visit: Payer: Self-pay

## 2020-09-29 ENCOUNTER — Ambulatory Visit (INDEPENDENT_AMBULATORY_CARE_PROVIDER_SITE_OTHER): Payer: Medicare HMO | Admitting: Urology

## 2020-09-29 VITALS — BP 144/69 | HR 58 | Ht 76.0 in | Wt 398.0 lb

## 2020-09-29 DIAGNOSIS — Z992 Dependence on renal dialysis: Secondary | ICD-10-CM | POA: Diagnosis not present

## 2020-09-29 DIAGNOSIS — N529 Male erectile dysfunction, unspecified: Secondary | ICD-10-CM | POA: Diagnosis not present

## 2020-09-29 DIAGNOSIS — E349 Endocrine disorder, unspecified: Secondary | ICD-10-CM | POA: Insufficient documentation

## 2020-09-29 DIAGNOSIS — D352 Benign neoplasm of pituitary gland: Secondary | ICD-10-CM

## 2020-09-29 DIAGNOSIS — R972 Elevated prostate specific antigen [PSA]: Secondary | ICD-10-CM | POA: Insufficient documentation

## 2020-09-29 DIAGNOSIS — R7989 Other specified abnormal findings of blood chemistry: Secondary | ICD-10-CM

## 2020-09-29 DIAGNOSIS — E229 Hyperfunction of pituitary gland, unspecified: Secondary | ICD-10-CM | POA: Diagnosis not present

## 2020-09-29 DIAGNOSIS — N186 End stage renal disease: Secondary | ICD-10-CM | POA: Diagnosis not present

## 2020-09-29 LAB — HEMOGLOBIN AND HEMATOCRIT, BLOOD
HCT: 41.8 % (ref 39.0–52.0)
Hemoglobin: 13.1 g/dL (ref 13.0–17.0)

## 2020-09-29 LAB — PSA: Prostatic Specific Antigen: 0.58 ng/mL (ref 0.00–4.00)

## 2020-09-29 MED ORDER — SILDENAFIL CITRATE 25 MG PO TABS: 25.0000 mg | ORAL_TABLET | Freq: Every day | ORAL | 3 refills | Status: DC | PRN

## 2020-09-29 NOTE — Addendum Note (Signed)
Addended by: Memory Argue H on: 09/29/2020 10:00 AM   Modules accepted: Orders

## 2020-09-30 DIAGNOSIS — Z992 Dependence on renal dialysis: Secondary | ICD-10-CM | POA: Diagnosis not present

## 2020-09-30 DIAGNOSIS — N186 End stage renal disease: Secondary | ICD-10-CM | POA: Diagnosis not present

## 2020-09-30 DIAGNOSIS — Z7901 Long term (current) use of anticoagulants: Secondary | ICD-10-CM | POA: Diagnosis not present

## 2020-10-02 DIAGNOSIS — Z992 Dependence on renal dialysis: Secondary | ICD-10-CM | POA: Diagnosis not present

## 2020-10-02 DIAGNOSIS — N186 End stage renal disease: Secondary | ICD-10-CM | POA: Diagnosis not present

## 2020-10-04 DIAGNOSIS — N186 End stage renal disease: Secondary | ICD-10-CM | POA: Diagnosis not present

## 2020-10-04 DIAGNOSIS — Z992 Dependence on renal dialysis: Secondary | ICD-10-CM | POA: Diagnosis not present

## 2020-10-07 DIAGNOSIS — Z7901 Long term (current) use of anticoagulants: Secondary | ICD-10-CM | POA: Diagnosis not present

## 2020-10-07 DIAGNOSIS — Z992 Dependence on renal dialysis: Secondary | ICD-10-CM | POA: Diagnosis not present

## 2020-10-07 DIAGNOSIS — N186 End stage renal disease: Secondary | ICD-10-CM | POA: Diagnosis not present

## 2020-10-08 DIAGNOSIS — H26491 Other secondary cataract, right eye: Secondary | ICD-10-CM | POA: Diagnosis not present

## 2020-10-09 DIAGNOSIS — Z992 Dependence on renal dialysis: Secondary | ICD-10-CM | POA: Diagnosis not present

## 2020-10-09 DIAGNOSIS — N186 End stage renal disease: Secondary | ICD-10-CM | POA: Diagnosis not present

## 2020-10-11 DIAGNOSIS — N186 End stage renal disease: Secondary | ICD-10-CM | POA: Diagnosis not present

## 2020-10-11 DIAGNOSIS — Z992 Dependence on renal dialysis: Secondary | ICD-10-CM | POA: Diagnosis not present

## 2020-10-14 DIAGNOSIS — N186 End stage renal disease: Secondary | ICD-10-CM | POA: Diagnosis not present

## 2020-10-14 DIAGNOSIS — Z7901 Long term (current) use of anticoagulants: Secondary | ICD-10-CM | POA: Diagnosis not present

## 2020-10-14 DIAGNOSIS — Z992 Dependence on renal dialysis: Secondary | ICD-10-CM | POA: Diagnosis not present

## 2020-10-15 DIAGNOSIS — E113592 Type 2 diabetes mellitus with proliferative diabetic retinopathy without macular edema, left eye: Secondary | ICD-10-CM | POA: Diagnosis not present

## 2020-10-16 DIAGNOSIS — Z992 Dependence on renal dialysis: Secondary | ICD-10-CM | POA: Diagnosis not present

## 2020-10-16 DIAGNOSIS — N186 End stage renal disease: Secondary | ICD-10-CM | POA: Diagnosis not present

## 2020-10-18 DIAGNOSIS — Z992 Dependence on renal dialysis: Secondary | ICD-10-CM | POA: Diagnosis not present

## 2020-10-18 DIAGNOSIS — N186 End stage renal disease: Secondary | ICD-10-CM | POA: Diagnosis not present

## 2020-10-21 DIAGNOSIS — N186 End stage renal disease: Secondary | ICD-10-CM | POA: Diagnosis not present

## 2020-10-21 DIAGNOSIS — Z7901 Long term (current) use of anticoagulants: Secondary | ICD-10-CM | POA: Diagnosis not present

## 2020-10-21 DIAGNOSIS — Z992 Dependence on renal dialysis: Secondary | ICD-10-CM | POA: Diagnosis not present

## 2020-10-23 DIAGNOSIS — Z992 Dependence on renal dialysis: Secondary | ICD-10-CM | POA: Diagnosis not present

## 2020-10-23 DIAGNOSIS — N186 End stage renal disease: Secondary | ICD-10-CM | POA: Diagnosis not present

## 2020-10-25 DIAGNOSIS — Z992 Dependence on renal dialysis: Secondary | ICD-10-CM | POA: Diagnosis not present

## 2020-10-25 DIAGNOSIS — N186 End stage renal disease: Secondary | ICD-10-CM | POA: Diagnosis not present

## 2020-10-27 DIAGNOSIS — Z992 Dependence on renal dialysis: Secondary | ICD-10-CM | POA: Diagnosis not present

## 2020-10-27 DIAGNOSIS — N186 End stage renal disease: Secondary | ICD-10-CM | POA: Diagnosis not present

## 2020-10-28 DIAGNOSIS — Z7901 Long term (current) use of anticoagulants: Secondary | ICD-10-CM | POA: Diagnosis not present

## 2020-10-28 DIAGNOSIS — N186 End stage renal disease: Secondary | ICD-10-CM | POA: Diagnosis not present

## 2020-10-28 DIAGNOSIS — Z992 Dependence on renal dialysis: Secondary | ICD-10-CM | POA: Diagnosis not present

## 2020-10-30 DIAGNOSIS — Z992 Dependence on renal dialysis: Secondary | ICD-10-CM | POA: Diagnosis not present

## 2020-10-30 DIAGNOSIS — N186 End stage renal disease: Secondary | ICD-10-CM | POA: Diagnosis not present

## 2020-11-01 DIAGNOSIS — N186 End stage renal disease: Secondary | ICD-10-CM | POA: Diagnosis not present

## 2020-11-01 DIAGNOSIS — Z992 Dependence on renal dialysis: Secondary | ICD-10-CM | POA: Diagnosis not present

## 2020-11-04 DIAGNOSIS — Z992 Dependence on renal dialysis: Secondary | ICD-10-CM | POA: Diagnosis not present

## 2020-11-04 DIAGNOSIS — Z7901 Long term (current) use of anticoagulants: Secondary | ICD-10-CM | POA: Diagnosis not present

## 2020-11-04 DIAGNOSIS — N186 End stage renal disease: Secondary | ICD-10-CM | POA: Diagnosis not present

## 2020-11-05 DIAGNOSIS — E113592 Type 2 diabetes mellitus with proliferative diabetic retinopathy without macular edema, left eye: Secondary | ICD-10-CM | POA: Diagnosis not present

## 2020-11-06 DIAGNOSIS — N186 End stage renal disease: Secondary | ICD-10-CM | POA: Diagnosis not present

## 2020-11-06 DIAGNOSIS — Z992 Dependence on renal dialysis: Secondary | ICD-10-CM | POA: Diagnosis not present

## 2020-11-08 DIAGNOSIS — N186 End stage renal disease: Secondary | ICD-10-CM | POA: Diagnosis not present

## 2020-11-08 DIAGNOSIS — Z992 Dependence on renal dialysis: Secondary | ICD-10-CM | POA: Diagnosis not present

## 2020-11-11 DIAGNOSIS — Z992 Dependence on renal dialysis: Secondary | ICD-10-CM | POA: Diagnosis not present

## 2020-11-11 DIAGNOSIS — N186 End stage renal disease: Secondary | ICD-10-CM | POA: Diagnosis not present

## 2020-11-11 DIAGNOSIS — Z7901 Long term (current) use of anticoagulants: Secondary | ICD-10-CM | POA: Diagnosis not present

## 2020-11-13 DIAGNOSIS — N186 End stage renal disease: Secondary | ICD-10-CM | POA: Diagnosis not present

## 2020-11-13 DIAGNOSIS — Z992 Dependence on renal dialysis: Secondary | ICD-10-CM | POA: Diagnosis not present

## 2020-11-15 DIAGNOSIS — N186 End stage renal disease: Secondary | ICD-10-CM | POA: Diagnosis not present

## 2020-11-15 DIAGNOSIS — Z992 Dependence on renal dialysis: Secondary | ICD-10-CM | POA: Diagnosis not present

## 2020-11-18 DIAGNOSIS — Z992 Dependence on renal dialysis: Secondary | ICD-10-CM | POA: Diagnosis not present

## 2020-11-18 DIAGNOSIS — Z7901 Long term (current) use of anticoagulants: Secondary | ICD-10-CM | POA: Diagnosis not present

## 2020-11-18 DIAGNOSIS — N186 End stage renal disease: Secondary | ICD-10-CM | POA: Diagnosis not present

## 2020-11-19 DIAGNOSIS — I871 Compression of vein: Secondary | ICD-10-CM | POA: Diagnosis not present

## 2020-11-19 DIAGNOSIS — Z992 Dependence on renal dialysis: Secondary | ICD-10-CM | POA: Diagnosis not present

## 2020-11-19 DIAGNOSIS — E1122 Type 2 diabetes mellitus with diabetic chronic kidney disease: Secondary | ICD-10-CM | POA: Diagnosis not present

## 2020-11-19 DIAGNOSIS — T82858A Stenosis of vascular prosthetic devices, implants and grafts, initial encounter: Secondary | ICD-10-CM | POA: Diagnosis not present

## 2020-11-19 DIAGNOSIS — T82898A Other specified complication of vascular prosthetic devices, implants and grafts, initial encounter: Secondary | ICD-10-CM | POA: Diagnosis not present

## 2020-11-19 DIAGNOSIS — I120 Hypertensive chronic kidney disease with stage 5 chronic kidney disease or end stage renal disease: Secondary | ICD-10-CM | POA: Diagnosis not present

## 2020-11-19 DIAGNOSIS — N186 End stage renal disease: Secondary | ICD-10-CM | POA: Diagnosis not present

## 2020-11-20 DIAGNOSIS — N186 End stage renal disease: Secondary | ICD-10-CM | POA: Diagnosis not present

## 2020-11-20 DIAGNOSIS — Z992 Dependence on renal dialysis: Secondary | ICD-10-CM | POA: Diagnosis not present

## 2020-11-22 DIAGNOSIS — N186 End stage renal disease: Secondary | ICD-10-CM | POA: Diagnosis not present

## 2020-11-22 DIAGNOSIS — Z992 Dependence on renal dialysis: Secondary | ICD-10-CM | POA: Diagnosis not present

## 2020-11-25 DIAGNOSIS — Z992 Dependence on renal dialysis: Secondary | ICD-10-CM | POA: Diagnosis not present

## 2020-11-25 DIAGNOSIS — Z7901 Long term (current) use of anticoagulants: Secondary | ICD-10-CM | POA: Diagnosis not present

## 2020-11-25 DIAGNOSIS — N186 End stage renal disease: Secondary | ICD-10-CM | POA: Diagnosis not present

## 2020-11-27 DIAGNOSIS — Z992 Dependence on renal dialysis: Secondary | ICD-10-CM | POA: Diagnosis not present

## 2020-11-27 DIAGNOSIS — N186 End stage renal disease: Secondary | ICD-10-CM | POA: Diagnosis not present

## 2020-11-28 DIAGNOSIS — Z992 Dependence on renal dialysis: Secondary | ICD-10-CM | POA: Diagnosis not present

## 2020-11-28 DIAGNOSIS — N186 End stage renal disease: Secondary | ICD-10-CM | POA: Diagnosis not present

## 2020-11-29 DIAGNOSIS — N186 End stage renal disease: Secondary | ICD-10-CM | POA: Diagnosis not present

## 2020-11-29 DIAGNOSIS — Z992 Dependence on renal dialysis: Secondary | ICD-10-CM | POA: Diagnosis not present

## 2020-12-02 DIAGNOSIS — Z7901 Long term (current) use of anticoagulants: Secondary | ICD-10-CM | POA: Diagnosis not present

## 2020-12-02 DIAGNOSIS — Z992 Dependence on renal dialysis: Secondary | ICD-10-CM | POA: Diagnosis not present

## 2020-12-02 DIAGNOSIS — N186 End stage renal disease: Secondary | ICD-10-CM | POA: Diagnosis not present

## 2020-12-04 DIAGNOSIS — Z992 Dependence on renal dialysis: Secondary | ICD-10-CM | POA: Diagnosis not present

## 2020-12-04 DIAGNOSIS — N186 End stage renal disease: Secondary | ICD-10-CM | POA: Diagnosis not present

## 2020-12-06 DIAGNOSIS — Z992 Dependence on renal dialysis: Secondary | ICD-10-CM | POA: Diagnosis not present

## 2020-12-06 DIAGNOSIS — N186 End stage renal disease: Secondary | ICD-10-CM | POA: Diagnosis not present

## 2020-12-09 ENCOUNTER — Ambulatory Visit (INDEPENDENT_AMBULATORY_CARE_PROVIDER_SITE_OTHER): Payer: Medicare Other | Admitting: Nurse Practitioner

## 2020-12-09 ENCOUNTER — Encounter (INDEPENDENT_AMBULATORY_CARE_PROVIDER_SITE_OTHER): Payer: Medicare Other

## 2020-12-09 DIAGNOSIS — N186 End stage renal disease: Secondary | ICD-10-CM | POA: Diagnosis not present

## 2020-12-09 DIAGNOSIS — Z7901 Long term (current) use of anticoagulants: Secondary | ICD-10-CM | POA: Diagnosis not present

## 2020-12-09 DIAGNOSIS — Z992 Dependence on renal dialysis: Secondary | ICD-10-CM | POA: Diagnosis not present

## 2020-12-11 DIAGNOSIS — N186 End stage renal disease: Secondary | ICD-10-CM | POA: Diagnosis not present

## 2020-12-11 DIAGNOSIS — Z992 Dependence on renal dialysis: Secondary | ICD-10-CM | POA: Diagnosis not present

## 2020-12-13 DIAGNOSIS — Z992 Dependence on renal dialysis: Secondary | ICD-10-CM | POA: Diagnosis not present

## 2020-12-13 DIAGNOSIS — N186 End stage renal disease: Secondary | ICD-10-CM | POA: Diagnosis not present

## 2020-12-16 DIAGNOSIS — N186 End stage renal disease: Secondary | ICD-10-CM | POA: Diagnosis not present

## 2020-12-16 DIAGNOSIS — Z992 Dependence on renal dialysis: Secondary | ICD-10-CM | POA: Diagnosis not present

## 2020-12-16 DIAGNOSIS — Z7901 Long term (current) use of anticoagulants: Secondary | ICD-10-CM | POA: Diagnosis not present

## 2020-12-18 DIAGNOSIS — Z992 Dependence on renal dialysis: Secondary | ICD-10-CM | POA: Diagnosis not present

## 2020-12-18 DIAGNOSIS — N186 End stage renal disease: Secondary | ICD-10-CM | POA: Diagnosis not present

## 2020-12-19 ENCOUNTER — Other Ambulatory Visit (INDEPENDENT_AMBULATORY_CARE_PROVIDER_SITE_OTHER): Payer: Self-pay | Admitting: Nurse Practitioner

## 2020-12-19 DIAGNOSIS — N186 End stage renal disease: Secondary | ICD-10-CM

## 2020-12-20 DIAGNOSIS — Z992 Dependence on renal dialysis: Secondary | ICD-10-CM | POA: Diagnosis not present

## 2020-12-20 DIAGNOSIS — N186 End stage renal disease: Secondary | ICD-10-CM | POA: Diagnosis not present

## 2020-12-22 ENCOUNTER — Ambulatory Visit (INDEPENDENT_AMBULATORY_CARE_PROVIDER_SITE_OTHER): Payer: Medicare HMO | Admitting: Vascular Surgery

## 2020-12-22 ENCOUNTER — Other Ambulatory Visit: Payer: Self-pay

## 2020-12-22 ENCOUNTER — Encounter (INDEPENDENT_AMBULATORY_CARE_PROVIDER_SITE_OTHER): Payer: Self-pay | Admitting: Vascular Surgery

## 2020-12-22 ENCOUNTER — Ambulatory Visit (INDEPENDENT_AMBULATORY_CARE_PROVIDER_SITE_OTHER): Payer: Medicare HMO

## 2020-12-22 VITALS — BP 150/76 | HR 53 | Resp 16 | Wt 396.6 lb

## 2020-12-22 DIAGNOSIS — T829XXS Unspecified complication of cardiac and vascular prosthetic device, implant and graft, sequela: Secondary | ICD-10-CM | POA: Diagnosis not present

## 2020-12-22 DIAGNOSIS — E1059 Type 1 diabetes mellitus with other circulatory complications: Secondary | ICD-10-CM

## 2020-12-22 DIAGNOSIS — I739 Peripheral vascular disease, unspecified: Secondary | ICD-10-CM

## 2020-12-22 DIAGNOSIS — N186 End stage renal disease: Secondary | ICD-10-CM

## 2020-12-22 DIAGNOSIS — I10 Essential (primary) hypertension: Secondary | ICD-10-CM

## 2020-12-22 NOTE — Progress Notes (Signed)
MRN : [id number]  [name] is a 50 y.o. (Jul 12, 1971) male who presents with chief complaint of No chief complaint on file. [name]  History of Present Illness:   The patient returns to the office for followup of their dialysis access. The function of the access has been stable. The patient denies increased bleeding time or increased recirculation. Patient denies difficulty with cannulation. The patient denies hand pain or other symptoms consistent with steal phenomena.  No significant arm swelling.  The patient denies redness or swelling at the access site. The patient denies fever or chills at home or while on dialysis.  The patient denies amaurosis fugax or recent TIA symptoms. There are no recent neurological changes noted. The patient denies claudication symptoms or rest pain symptoms. The patient denies history of DVT, PE or superficial thrombophlebitis. The patient denies recent episodes of angina or shortness of breath.   Duplex ultrasound of the AV access shows a patent access.  The previously noted stenosis is unchanged compared to last study.    No outpatient medications have been marked as taking for the 12/22/20 encounter (Appointment) with Delana Meyer, Dolores Lory, MD.    Past Medical History:  Diagnosis Date  . Anemia   . Chronic kidney disease    14% FUNCTION  . Diabetes mellitus without complication ([place])   . Diabetic retinopathy ([place])   . Dyspnea    DOE  . Dysrhythmia   . End stage renal disease ([place])   . History of orthopnea    error wrong patietn  . Hypercholesteremia    error  . Hyperkalemia    error wrong patient  . Hypertension   . Long-term insulin use ([place])   . Lymphedema of leg   . Metabolic encephalopathy    error  . Microalbuminuria   . MSSA (methicillin susceptible Staphylococcus aureus)   . Obesity   . PVD (peripheral vascular disease) ([place])   . Respiratory failure ([place])    Error  . Sepsis ([place])    error  . Sleep apnea    no CPAP  . UTI  (urinary tract infection)    error  . Venous ulcer ([place])   . Vitamin D deficiency     Past Surgical History:  Procedure Laterality Date  . A/V FISTULAGRAM Right 10/24/2018   Procedure: A/V FISTULAGRAM;  Surgeon: Katha Cabal, MD;  Location: [facility];  Service: Cardiovascular;  Laterality: Right;  . A/V FISTULAGRAM Right 11/24/2018   Procedure: A/V FISTULAGRAM;  Surgeon: Katha Cabal, MD;  Location: [facility];  Service: Cardiovascular;  Laterality: Right;  . A/V FISTULAGRAM Left 07/31/2019   Procedure: A/V FISTULAGRAM;  Surgeon: Katha Cabal, MD;  Location: [facility];  Service: Cardiovascular;  Laterality: Left;  . A/V FISTULAGRAM Left 12/11/2019   Procedure: A/V FISTULAGRAM;  Surgeon: Katha Cabal, MD;  Location: [facility];  Service: Cardiovascular;  Laterality: Left;  . A/V SHUNT INTERVENTION Right 02/21/2019   Procedure: A/V SHUNT INTERVENTION;  Surgeon: Katha Cabal, MD;  Location: [facility];  Service: Cardiovascular;  Laterality: Right;  . APPLICATION OF WOUND VAC Left 10/03/2017   Procedure: APPLICATION OF WOUND VAC;  Surgeon: Algernon Huxley, MD;  Location: [facility];  Service: General;  Laterality: Left;  . APPLICATION OF WOUND VAC Left 10/11/2017   Procedure: APPLICATION OF WOUND VAC;  Surgeon: Katha Cabal, MD;  Location: [facility];  Service: Vascular;  Laterality: Left;  .  APPLICATION OF WOUND VAC Left 10/14/2017   Procedure: WOUND VAC CHANGE;  Surgeon: Katha Cabal, MD;  Location: [facility];  Service: Vascular;  Laterality: Left;  left lower leg  . APPLICATION OF WOUND VAC Left 10/18/2017   Procedure: WOUND VAC CHANGE;  Surgeon: Katha Cabal, MD;  Location: [facility];  Service: Vascular;  Laterality: Left;  . APPLICATION OF WOUND VAC Left 10/07/2017   Procedure: APPLICATION OF WOUND VAC;  Surgeon: Katha Cabal, MD;  Location: [facility];  Service: Vascular;  Laterality: Left;  . AV FISTULA  INSERTION W/ RF MAGNETIC GUIDANCE Right 07/19/2018   Procedure: AV FISTULA INSERTION W/RF MAGNETIC GUIDANCE;  Surgeon: Katha Cabal, MD;  Location: [facility];  Service: Cardiovascular;  Laterality: Right;  . AV FISTULA PLACEMENT Left 05/11/2019   Procedure: ARTERIOVENOUS (AV) FISTULA CREATION (RADIOCEPHALIC );  Surgeon: Katha Cabal, MD;  Location: [facility];  Service: Vascular;  Laterality: Left;  . AV FISTULA PLACEMENT Left 02/20/2020   Procedure: INSERTION OF ARTERIOVENOUS (AV) GORE-TEX GRAFT LEFT ARM ( FOREARM LOOP );  Surgeon: Katha Cabal, MD;  Location: [facility];  Service: Vascular;  Laterality: Left;  . CATARACT EXTRACTION W/PHACO Left 11/01/2018   Procedure: CATARACT EXTRACTION PHACO AND INTRAOCULAR LENS PLACEMENT (IOC)-LEFT, DIABETIC-INSULIN DEPENDENT;  Surgeon: Eulogio Bear, MD;  Location: [facility];  Service: Ophthalmology;  Laterality: Left;  [place] 00:41.8 CDE 4.61 Fluid Pack Lot # [id number]  . CATARACT EXTRACTION W/PHACO Right 04/27/2019   Procedure: CATARACT EXTRACTION PHACO AND INTRAOCULAR LENS PLACEMENT (IOC);  Surgeon: Eulogio Bear, MD;  Location: [facility];  Service: Ophthalmology;  Laterality: Right;  [place] 00:34 CDE 1.97 FLUID PACK LOT # [id number]   . COLONOSCOPY WITH PROPOFOL N/A 05/12/2020   Procedure: COLONOSCOPY WITH PROPOFOL;  Surgeon: [place], Benay Pike, MD;  Location: [facility];  Service: Gastroenterology;  Laterality: N/A;  . DIALYSIS/PERMA CATHETER INSERTION N/A 02/23/2019   Procedure: DIALYSIS/PERMA CATHETER INSERTION;  Surgeon: Algernon Huxley, MD;  Location: [facility];  Service: Cardiovascular;  Laterality: N/A;  . DIALYSIS/PERMA CATHETER REMOVAL N/A 04/03/2020   Procedure: DIALYSIS/PERMA CATHETER REMOVAL;  Surgeon: Katha Cabal, MD;  Location: [facility];  Service: Cardiovascular;  Laterality: N/A;  . I & D EXTREMITY Left 10/11/2017   Procedure: IRRIGATION AND DEBRIDEMENT EXTREMITY;  Surgeon: Katha Cabal,  MD;  Location: [facility];  Service: Vascular;  Laterality: Left;  . INCISION AND DRAINAGE ABSCESS Right 10/07/2017   Procedure: INCISION AND DRAINAGE ABSCESS;  Surgeon: Katha Cabal, MD;  Location: [facility];  Service: Vascular;  Laterality: Right;  . IRRIGATION AND DEBRIDEMENT ABSCESS Left 10/03/2017   Procedure: IRRIGATION AND DEBRIDEMENT ABSCESS with debridement of skin, soft tissue, muscle 50sq cm;  Surgeon: Algernon Huxley, MD;  Location: [facility];  Service: General;  Laterality: Left;  . TEMPORARY DIALYSIS CATHETER  02/20/2019   Procedure: TEMPORARY DIALYSIS CATHETER;  Surgeon: Katha Cabal, MD;  Location: [facility];  Service: Cardiovascular;;  . UPPER EXTREMITY ANGIOGRAPHY Left 09/18/2019   Procedure: UPPER EXTREMITY ANGIOGRAPHY;  Surgeon: Katha Cabal, MD;  Location: [facility];  Service: Cardiovascular;  Laterality: Left;    Social History Social History   Tobacco Use  . Smoking status: Former Smoker    Types: Cigars  . Smokeless tobacco: Former Systems developer    Quit date: 09/30/2017  . Tobacco comment: occasional  Vaping Use  . Vaping Use: Never used  Substance Use Topics  .  Alcohol use: Yes    Alcohol/week: 1.0 standard drink    Types: 1 Cans of beer per week    Comment: beer  . Drug use: Not Currently    Family History Family History  Problem Relation Age of Onset  . Diabetes Father   . Kidney disease Father   . Diabetes Daughter     Allergies  Allergen Reactions  . Lisinopril Swelling    Lips mouth   . Furosemide Cough    Light headed, blurry vision, weakness.  . Adhesive [Tape] Itching     REVIEW OF SYSTEMS (Negative unless checked)  Constitutional: [] Weight loss  [] Fever  [] Chills Cardiac: [] Chest pain   [] Chest pressure   [] Palpitations   [] Shortness of breath when laying flat   [] Shortness of breath with exertion. Vascular:  [] Pain in legs with walking   [] Pain in legs at rest  [] History of DVT   [] Phlebitis   [x] Swelling in legs    [] Varicose veins   [] Non-healing ulcers Pulmonary:   [] Uses home oxygen   [] Productive cough   [] Hemoptysis   [] Wheeze  [] COPD   [] Asthma Neurologic:  [] Dizziness   [] Seizures   [] History of stroke   [] History of TIA  [] Aphasia   [] Vissual changes   [] Weakness or numbness in arm   [] Weakness or numbness in leg Musculoskeletal:   [] Joint swelling   [] Joint pain   [] Low back pain Hematologic:  [] Easy bruising  [] Easy bleeding   [] Hypercoagulable state   [] Anemic Gastrointestinal:  [] Diarrhea   [] Vomiting  [] Gastroesophageal reflux/heartburn   [] Difficulty swallowing. Genitourinary:  [x] Chronic kidney disease   [] Difficult urination  [] Frequent urination   [] Blood in urine Skin:  [] Rashes   [] Ulcers  Psychological:  [] History of anxiety   []  History of major depression.  Physical Examination  There were no vitals filed for this visit. There is no height or weight on file to calculate BMI. Gen: WD/WN, NAD Head: [place]/AT, No temporalis wasting.  Ear/Nose/Throat: Hearing grossly intact, nares w/o erythema or drainage Eyes: PER, EOMI, sclera nonicteric.  Neck: Supple, no large masses.   Pulmonary:  Good air movement, no audible wheezing bilaterally, no use of accessory muscles.  Cardiac: RRR, no JVD Vascular: left forearm AV graft good thrill good bruit;  scattered varicosities present bilaterally.  severe venous stasis changes to the legs bilaterally.  3+ soft pitting edema left > right. Vessel Right Left  Radial Palpable Palpable  Gastrointestinal: Non-distended. No guarding/no peritoneal signs.  Musculoskeletal: M/S 5/5 throughout.  No deformity or atrophy.  Neurologic: CN 2-12 intact. Symmetrical.  Speech is fluent. Motor exam as listed above. Psychiatric: Judgment intact, Mood & affect appropriate for pt's clinical situation. Dermatologic: Venous rashes no ulcers noted.  No changes consistent with cellulitis. Lymph : + lichenification with skin changes of chronic lymphedema.  CBC Lab  Results  Component Value Date   WBC 6.0 02/19/2020   HGB 13.1 09/29/2020   HCT 41.8 09/29/2020   MCV 95.0 02/19/2020   PLT 190 02/19/2020    BMET    Component Value Date/Time   NA 140 02/20/2020 0650   K 5.1 05/12/2020 0842   CL 98 02/20/2020 0650   CO2 28 02/19/2020 1316   GLUCOSE 209 (H) 02/20/2020 0650   BUN 37 (H) 02/20/2020 0650   CREATININE 12.00 (H) 02/20/2020 0650   CALCIUM 8.5 (L) 02/19/2020 1316   GFRNONAA 8 (L) 02/19/2020 1316   GFRAA 9 (L) 02/19/2020 1316   CrCl cannot be calculated (Patient's most  recent lab result is older than the maximum 21 days allowed.).  COAG Lab Results  Component Value Date   INR 1.4 (H) 03/21/2020   INR 1.2 02/20/2020   INR 1.4 (H) 02/19/2020    Radiology No results found.   Assessment/Plan 1. End-stage renal disease ([place]) Recommend:  The patient is doing well and currently has adequate dialysis access. The patient's dialysis center is not reporting any access issues. Flow pattern is stable when compared to the prior ultrasound.  The patient should have a duplex ultrasound of the dialysis access in 6 months.  The patient will follow-up with me in the office after each ultrasound   - VAS [place] (AVF, AVG); Future  2. Complication of vascular access for dialysis, sequela Recommend:  The patient is doing well and currently has adequate dialysis access. The patient's dialysis center is not reporting any access issues. Flow pattern is stable when compared to the prior ultrasound.  The patient should have a duplex ultrasound of the dialysis access in 6 months.  The patient will follow-up with me in the office after each ultrasound   - VAS [place] (AVF, AVG); Future  3. PVD (peripheral vascular disease) ([place])  Recommend:  The patient has evidence of atherosclerosis of the lower extremities with claudication.  The patient does not voice lifestyle limiting changes at this point in  time.  Noninvasive studies do not suggest clinically significant change.  No invasive studies, angiography or surgery at this time The patient should continue walking and begin a more formal exercise program.  The patient should continue antiplatelet therapy and aggressive treatment of the lipid abnormalities  No changes in the patient's medications at this time  The patient should continue wearing graduated compression socks 10-15 mmHg strength to control the mild edema.    4. Primary hypertension Continue antihypertensive medications as already ordered, these medications have been reviewed and there are no changes at this time.   5. Type 1 diabetes mellitus with other circulatory complication (HCC) Continue hypoglycemic medications as already ordered, these medications have been reviewed and there are no changes at this time.  Hgb A1C to be monitored as already arranged by primary service   [name], MD  12/22/2020 8:40 AM

## 2020-12-23 DIAGNOSIS — N186 End stage renal disease: Secondary | ICD-10-CM | POA: Diagnosis not present

## 2020-12-23 DIAGNOSIS — Z992 Dependence on renal dialysis: Secondary | ICD-10-CM | POA: Diagnosis not present

## 2020-12-23 DIAGNOSIS — Z7901 Long term (current) use of anticoagulants: Secondary | ICD-10-CM | POA: Diagnosis not present

## 2020-12-23 DIAGNOSIS — Z794 Long term (current) use of insulin: Secondary | ICD-10-CM | POA: Diagnosis not present

## 2020-12-23 DIAGNOSIS — E119 Type 2 diabetes mellitus without complications: Secondary | ICD-10-CM | POA: Diagnosis not present

## 2020-12-25 DIAGNOSIS — Z992 Dependence on renal dialysis: Secondary | ICD-10-CM | POA: Diagnosis not present

## 2020-12-25 DIAGNOSIS — N186 End stage renal disease: Secondary | ICD-10-CM | POA: Diagnosis not present

## 2020-12-27 DIAGNOSIS — Z992 Dependence on renal dialysis: Secondary | ICD-10-CM | POA: Diagnosis not present

## 2020-12-27 DIAGNOSIS — N186 End stage renal disease: Secondary | ICD-10-CM | POA: Diagnosis not present

## 2020-12-28 DIAGNOSIS — Z992 Dependence on renal dialysis: Secondary | ICD-10-CM | POA: Diagnosis not present

## 2020-12-28 DIAGNOSIS — N186 End stage renal disease: Secondary | ICD-10-CM | POA: Diagnosis not present

## 2020-12-30 DIAGNOSIS — Z7901 Long term (current) use of anticoagulants: Secondary | ICD-10-CM | POA: Diagnosis not present

## 2020-12-30 DIAGNOSIS — Z992 Dependence on renal dialysis: Secondary | ICD-10-CM | POA: Diagnosis not present

## 2020-12-30 DIAGNOSIS — N186 End stage renal disease: Secondary | ICD-10-CM | POA: Diagnosis not present

## 2021-01-01 ENCOUNTER — Ambulatory Visit: Payer: Medicare Other | Admitting: Endocrinology

## 2021-01-01 DIAGNOSIS — Z992 Dependence on renal dialysis: Secondary | ICD-10-CM | POA: Diagnosis not present

## 2021-01-01 DIAGNOSIS — N186 End stage renal disease: Secondary | ICD-10-CM | POA: Diagnosis not present

## 2021-01-03 DIAGNOSIS — N186 End stage renal disease: Secondary | ICD-10-CM | POA: Diagnosis not present

## 2021-01-03 DIAGNOSIS — Z992 Dependence on renal dialysis: Secondary | ICD-10-CM | POA: Diagnosis not present

## 2021-01-06 DIAGNOSIS — Z7901 Long term (current) use of anticoagulants: Secondary | ICD-10-CM | POA: Diagnosis not present

## 2021-01-06 DIAGNOSIS — N186 End stage renal disease: Secondary | ICD-10-CM | POA: Diagnosis not present

## 2021-01-06 DIAGNOSIS — Z992 Dependence on renal dialysis: Secondary | ICD-10-CM | POA: Diagnosis not present

## 2021-01-08 DIAGNOSIS — Z992 Dependence on renal dialysis: Secondary | ICD-10-CM | POA: Diagnosis not present

## 2021-01-08 DIAGNOSIS — N186 End stage renal disease: Secondary | ICD-10-CM | POA: Diagnosis not present

## 2021-01-10 DIAGNOSIS — N186 End stage renal disease: Secondary | ICD-10-CM | POA: Diagnosis not present

## 2021-01-10 DIAGNOSIS — Z992 Dependence on renal dialysis: Secondary | ICD-10-CM | POA: Diagnosis not present

## 2021-01-13 DIAGNOSIS — Z7901 Long term (current) use of anticoagulants: Secondary | ICD-10-CM | POA: Diagnosis not present

## 2021-01-13 DIAGNOSIS — Z992 Dependence on renal dialysis: Secondary | ICD-10-CM | POA: Diagnosis not present

## 2021-01-13 DIAGNOSIS — N186 End stage renal disease: Secondary | ICD-10-CM | POA: Diagnosis not present

## 2021-01-15 DIAGNOSIS — N186 End stage renal disease: Secondary | ICD-10-CM | POA: Diagnosis not present

## 2021-01-15 DIAGNOSIS — Z992 Dependence on renal dialysis: Secondary | ICD-10-CM | POA: Diagnosis not present

## 2021-01-17 DIAGNOSIS — N186 End stage renal disease: Secondary | ICD-10-CM | POA: Diagnosis not present

## 2021-01-17 DIAGNOSIS — Z992 Dependence on renal dialysis: Secondary | ICD-10-CM | POA: Diagnosis not present

## 2021-01-19 ENCOUNTER — Encounter: Payer: Self-pay | Admitting: Emergency Medicine

## 2021-01-19 ENCOUNTER — Ambulatory Visit
Admission: EM | Admit: 2021-01-19 | Discharge: 2021-01-19 | Disposition: A | Discharge status: Home / Self Care | Payer: Medicare HMO | Attending: Physician Assistant | Admitting: Physician Assistant

## 2021-01-19 ENCOUNTER — Other Ambulatory Visit: Payer: Self-pay

## 2021-01-19 DIAGNOSIS — U071 COVID-19: Secondary | ICD-10-CM | POA: Insufficient documentation

## 2021-01-19 DIAGNOSIS — J069 Acute upper respiratory infection, unspecified: Secondary | ICD-10-CM | POA: Insufficient documentation

## 2021-01-19 DIAGNOSIS — R0981 Nasal congestion: Secondary | ICD-10-CM | POA: Diagnosis not present

## 2021-01-19 DIAGNOSIS — Z87891 Personal history of nicotine dependence: Secondary | ICD-10-CM | POA: Diagnosis not present

## 2021-01-19 DIAGNOSIS — Z992 Dependence on renal dialysis: Secondary | ICD-10-CM | POA: Diagnosis not present

## 2021-01-19 DIAGNOSIS — R059 Cough, unspecified: Secondary | ICD-10-CM | POA: Insufficient documentation

## 2021-01-19 DIAGNOSIS — N186 End stage renal disease: Secondary | ICD-10-CM | POA: Diagnosis not present

## 2021-01-19 NOTE — Discharge Instructions (Signed)
You have received COVID testing today either for positive exposure, concerning symptoms that could be related to COVID infection, screening purposes, or re-testing after confirmed positive.  Your test obtained today checks for active viral infection in the last 1-2 weeks. If your test is negative now, you can still test positive later. So, if you do develop symptoms you should either get re-tested and/or isolate x 5 days and then strict mask use x 5 days (unvaccinated) or mask use x 10 days (vaccinated). Please follow CDC guidelines.  While Rapid antigen tests come back in 15-20 minutes, send out PCR/molecular test results typically come back within 1-3 days. In the mean time, if you are symptomatic, assume this could be a positive test and treat/monitor yourself as if you do have COVID.   We will call with test results if positive. Please download the MyChart app and set up a profile to access test results.   If symptomatic, go home and rest. Push fluids. Take Tylenol as needed for discomfort. Gargle warm salt water. Throat lozenges. Take Mucinex DM or Robitussin for cough. Humidifier in bedroom to ease coughing. Warm showers. Also review the COVID handout for more information.  COVID-19 INFECTION: The incubation period of COVID-19 is approximately 14 days after exposure, with most symptoms developing in roughly 4-5 days. Symptoms may range in severity from mild to critically severe. Roughly 80% of those infected will have mild symptoms. People of any age may become infected with COVID-19 and have the ability to transmit the virus. The most common symptoms include: fever, fatigue, cough, body aches, headaches, sore throat, nasal congestion, shortness of breath, nausea, vomiting, diarrhea, changes in smell and/or taste.    COURSE OF ILLNESS Some patients may begin with mild disease which can progress quickly into critical symptoms. If your symptoms are worsening please call ahead to the Emergency  Department and proceed there for further treatment. Recovery time appears to be roughly 1-2 weeks for mild symptoms and 3-6 weeks for severe disease.   GO IMMEDIATELY TO ER FOR FEVER YOU ARE UNABLE TO GET DOWN WITH TYLENOL, BREATHING PROBLEMS, CHEST PAIN, FATIGUE, LETHARGY, INABILITY TO EAT OR DRINK, ETC  QUARANTINE AND ISOLATION: To help decrease the spread of COVID-19 please remain isolated if you have COVID infection or are highly suspected to have COVID infection. This means -stay home and isolate to one room in the home if you live with others. Do not share a bed or bathroom with others while ill, sanitize and wipe down all countertops and keep common areas clean and disinfected. Stay home for 5 days. If you have no symptoms or your symptoms are resolving after 5 days, you can leave your house. Continue to wear a mask around others for 5 additional days. If you have been in close contact (within 6 feet) of someone diagnosed with COVID 19, you are advised to quarantine in your home for 14 days as symptoms can develop anywhere from 2-14 days after exposure to the virus. If you develop symptoms, you  must isolate.  Most current guidelines for COVID after exposure -unvaccinated: isolate 5 days and strict mask use x 5 days. Test on day 5 is possible -vaccinated: wear mask x 10 days if symptoms do not develop -You do not necessarily need to be tested for COVID if you have + exposure and  develop symptoms. Just isolate at home x10 days from symptom onset During this global pandemic, CDC advises to practice social distancing, try to stay at least   6ft away from others at all times. Wear a face covering. Wash and sanitize your hands regularly and avoid going anywhere that is not necessary.  KEEP IN MIND THAT THE COVID TEST IS NOT 100% ACCURATE AND YOU SHOULD STILL DO EVERYTHING TO PREVENT POTENTIAL SPREAD OF VIRUS TO OTHERS (WEAR MASK, WEAR GLOVES, WASH HANDS AND SANITIZE REGULARLY). IF INITIAL TEST IS  NEGATIVE, THIS MAY NOT MEAN YOU ARE DEFINITELY NEGATIVE. MOST ACCURATE TESTING IS DONE 5-7 DAYS AFTER EXPOSURE.   It is not advised by CDC to get re-tested after receiving a positive COVID test since you can still test positive for weeks to months after you have already cleared the virus.   *If you have not been vaccinated for COVID, I strongly suggest you consider getting vaccinated as long as there are no contraindications.   

## 2021-01-19 NOTE — ED Provider Notes (Signed)
[facility]    CSN: [id number] Arrival date & time: 01/19/21  1221      History   Chief Complaint No chief complaint on file.   HPI [name] is a 50 y.o. male presenting for approximately 1 week history of cough and nasal congestion.  Denies any fever, fatigue, sore throat, sinus pain or ear pain.  No chest pain, wheezing or breathing difficulty.  Patient would like to be tested for COVID-19 today.  He denies any known exposure.  Has been vaccinated for COVID-19 x3 with Moderna.  Patient's been taking over-the-counter decongestants and Tylenol with some improvement in symptoms.  States he feels about 70% better than when his symptoms started but wanted to make sure he did not need any antibiotics and to also see if there was any concern for COVID-19.  Patient does have history of type 1 diabetes, end-stage renal disease on dialysis, anemia, obstructive sleep apnea, and peripheral vascular disease.  No other concerns.  HPI  Past Medical History:  Diagnosis Date  . Anemia   . Chronic kidney disease    14% FUNCTION  . Diabetes mellitus without complication ([place])   . Diabetic retinopathy ([place])   . Dyspnea    DOE  . Dysrhythmia   . End stage renal disease ([place])   . History of orthopnea    error wrong patietn  . Hypercholesteremia    error  . Hyperkalemia    error wrong patient  . Hypertension   . Long-term insulin use ([place])   . Lymphedema of leg   . Metabolic encephalopathy    error  . Microalbuminuria   . MSSA (methicillin susceptible Staphylococcus aureus)   . Obesity   . PVD (peripheral vascular disease) ([place])   . Respiratory failure ([place])    Error  . Sepsis ([place])    error  . Sleep apnea    no CPAP  . UTI (urinary tract infection)    error  . Venous ulcer ([place])   . Vitamin D deficiency     Patient Active Problem List   Diagnosis Date Noted  . Hyperprolactinemia ([place]) 07/03/2020  . Gout 08/16/2019  . End-stage renal disease ([place]) 06/07/2019   . Complication of vascular access for dialysis 06/07/2019  . Diabetic retinopathy of both eyes ([place]) 04/12/2019  . Morbid obesity with BMI of 45.0-49.9, adult ([place]) 04/12/2019  . AVF (arteriovenous fistula) ([place]) 02/22/2019  . Hyperkalemia 02/20/2019  . End stage renal disease ([place]) 05/29/2018  . Lymphedema 11/03/2017  . Cellulitis 10/01/2017  . Chronic venous insufficiency 10/01/2017  . Hypertensive disorder 10/01/2017  . Hypertension 09/20/2017  . Diabetes mellitus without complication ([place]) 85/63/1497  . Swelling of limb 09/20/2017  . CKD (chronic kidney disease) stage 5, GFR less than 15 ml/min (HCC) 08/30/2017  . PVD (peripheral vascular disease) ([place]) 06/18/2014  . Long-term insulin use ([place]) 06/13/2014  . Microalbuminuria 03/12/2014  . Type 1 diabetes mellitus ([place]) 08/31/2003    Past Surgical History:  Procedure Laterality Date  . A/V FISTULAGRAM Right 10/24/2018   Procedure: A/V FISTULAGRAM;  Surgeon: Katha Cabal, MD;  Location: [place] [facility];  Service: Cardiovascular;  Laterality: Right;  . A/V FISTULAGRAM Right 11/24/2018   Procedure: A/V FISTULAGRAM;  Surgeon: Katha Cabal, MD;  Location: [place] [facility];  Service: Cardiovascular;  Laterality: Right;  . A/V FISTULAGRAM Left 07/31/2019   Procedure: A/V FISTULAGRAM;  Surgeon: Katha Cabal, MD;  Location: [place] [facility];  Service: Cardiovascular;  Laterality: Left;  . A/V FISTULAGRAM Left 12/11/2019   Procedure: A/V FISTULAGRAM;  Surgeon: Katha Cabal, MD;  Location: [place] [facility];  Service: Cardiovascular;  Laterality: Left;  . A/V SHUNT INTERVENTION Right 02/21/2019   Procedure: A/V SHUNT INTERVENTION;  Surgeon: Katha Cabal, MD;  Location: [place] [facility];  Service: Cardiovascular;  Laterality: Right;  . APPLICATION OF WOUND VAC Left 10/03/2017   Procedure: APPLICATION OF WOUND VAC;  Surgeon: Algernon Huxley, MD;  Location: [facility];  Service: General;  Laterality:  Left;  . APPLICATION OF WOUND VAC Left 10/11/2017   Procedure: APPLICATION OF WOUND VAC;  Surgeon: Katha Cabal, MD;  Location: [facility];  Service: Vascular;  Laterality: Left;  . APPLICATION OF WOUND VAC Left 10/14/2017   Procedure: WOUND VAC CHANGE;  Surgeon: Katha Cabal, MD;  Location: [facility];  Service: Vascular;  Laterality: Left;  left lower leg  . APPLICATION OF WOUND VAC Left 10/18/2017   Procedure: WOUND VAC CHANGE;  Surgeon: Katha Cabal, MD;  Location: [facility];  Service: Vascular;  Laterality: Left;  . APPLICATION OF WOUND VAC Left 10/07/2017   Procedure: APPLICATION OF WOUND VAC;  Surgeon: Katha Cabal, MD;  Location: [facility];  Service: Vascular;  Laterality: Left;  . AV FISTULA INSERTION W/ RF MAGNETIC GUIDANCE Right 07/19/2018   Procedure: AV FISTULA INSERTION W/RF MAGNETIC GUIDANCE;  Surgeon: Katha Cabal, MD;  Location: [place] [facility];  Service: Cardiovascular;  Laterality: Right;  . AV FISTULA PLACEMENT Left 05/11/2019   Procedure: ARTERIOVENOUS (AV) FISTULA CREATION (RADIOCEPHALIC );  Surgeon: Katha Cabal, MD;  Location: [facility];  Service: Vascular;  Laterality: Left;  . AV FISTULA PLACEMENT Left 02/20/2020   Procedure: INSERTION OF ARTERIOVENOUS (AV) GORE-TEX GRAFT LEFT ARM ( FOREARM LOOP );  Surgeon: Katha Cabal, MD;  Location: [facility];  Service: Vascular;  Laterality: Left;  . CATARACT EXTRACTION W/PHACO Left 11/01/2018   Procedure: CATARACT EXTRACTION PHACO AND INTRAOCULAR LENS PLACEMENT (IOC)-LEFT, DIABETIC-INSULIN DEPENDENT;  Surgeon: Eulogio Bear, MD;  Location: [facility];  Service: Ophthalmology;  Laterality: Left;  [place] 00:41.8 CDE 4.61 Fluid Pack Lot # [id number]  . CATARACT EXTRACTION W/PHACO Right 04/27/2019   Procedure: CATARACT EXTRACTION PHACO AND INTRAOCULAR LENS PLACEMENT (IOC);  Surgeon: Eulogio Bear, MD;  Location: [facility];  Service: Ophthalmology;  Laterality: Right;  [place] 00:34 CDE 1.97 FLUID PACK LOT #  [id number]   . COLONOSCOPY WITH PROPOFOL N/A 05/12/2020   Procedure: COLONOSCOPY WITH PROPOFOL;  Surgeon: Toledo, Benay Pike, MD;  Location: [facility];  Service: Gastroenterology;  Laterality: N/A;  . DIALYSIS/PERMA CATHETER INSERTION N/A 02/23/2019   Procedure: DIALYSIS/PERMA CATHETER INSERTION;  Surgeon: Algernon Huxley, MD;  Location: [place] [facility];  Service: Cardiovascular;  Laterality: N/A;  . DIALYSIS/PERMA CATHETER REMOVAL N/A 04/03/2020   Procedure: DIALYSIS/PERMA CATHETER REMOVAL;  Surgeon: Katha Cabal, MD;  Location: [place] [facility];  Service: Cardiovascular;  Laterality: N/A;  . I & D EXTREMITY Left 10/11/2017   Procedure: IRRIGATION AND DEBRIDEMENT EXTREMITY;  Surgeon: Katha Cabal, MD;  Location: [facility];  Service: Vascular;  Laterality: Left;  . INCISION AND DRAINAGE ABSCESS Right 10/07/2017   Procedure: INCISION AND DRAINAGE ABSCESS;  Surgeon: Katha Cabal, MD;  Location: [facility];  Service: Vascular;  Laterality: Right;  . IRRIGATION AND DEBRIDEMENT ABSCESS Left 10/03/2017   Procedure: IRRIGATION AND DEBRIDEMENT ABSCESS with debridement of skin, soft tissue, muscle 50sq cm;  Surgeon: Lucky Cowboy,  Erskine Squibb, MD;  Location: [facility];  Service: General;  Laterality: Left;  . TEMPORARY DIALYSIS CATHETER  02/20/2019   Procedure: TEMPORARY DIALYSIS CATHETER;  Surgeon: Katha Cabal, MD;  Location: [place] [facility];  Service: Cardiovascular;;  . UPPER EXTREMITY ANGIOGRAPHY Left 09/18/2019   Procedure: UPPER EXTREMITY ANGIOGRAPHY;  Surgeon: Katha Cabal, MD;  Location: [place] [facility];  Service: Cardiovascular;  Laterality: Left;       Home Medications    Prior to Admission medications   Medication Sig Start Date End Date Taking? Authorizing Provider  allopurinol (ZYLOPRIM) 100 MG tablet Take 100 mg by mouth daily.    [provider]  amLODipine (NORVASC) 10 MG tablet Take 5 mg by mouth daily as needed (if bp is 190/90 or higher).      [provider]  ascorbic acid (VITAMIN C) 500 MG tablet Take 500 mg by mouth 3 (three) times a week.    [provider]  atenolol (TENORMIN) 50 MG tablet Take 25 mg by mouth daily as needed (if bp is 190/90 or higher).     [provider]  B Complex-C-Folic Acid (RENA-VITE RX) 1 MG TABS Take 1 tablet by mouth 3 (three) times a week.  08/30/19   [provider]  bromocriptine (PARLODEL) 2.5 MG tablet Take 1 tablet (2.5 mg total) by mouth at bedtime. 08/07/20   Renato Shin, MD  calcium acetate (PHOSLO) 667 MG capsule Take 1,334-2,001 mg by mouth See admin instructions. Take 2001 mg by mouth with each meal & take 1334 mg by mouth with each snack. 01/23/19   [provider]  cinacalcet (SENSIPAR) 30 MG tablet Take 30 mg by mouth daily.    [provider]  CINNAMON PO Take 500 mg by mouth 2 (two) times daily.     [provider]  dexamethasone (DECADRON) 1 MG tablet Take 1 tablet (1 mg total) by mouth See admin instructions. Take at 9-10 PM, the night before blood tests 07/03/20   Renato Shin, MD  DOCOSAHEXAENOIC ACID-EPA PO Take by mouth.    [provider]  GAVILYTE-C 240 g solution Take by mouth as directed. 05/01/20   [provider]  ibuprofen (ADVIL) 200 MG tablet Take 200 mg by mouth every 6 (six) hours as needed for headache or moderate pain.    [provider]  Insulin Glargine (LANTUS SOLOSTAR) 100 UNIT/ML Solostar Pen Inject 15 Units into the skin at bedtime.     [provider]  lidocaine-prilocaine (EMLA) cream Apply 1 application topically as needed (port access).    [provider]  loratadine (CLARITIN) 10 MG tablet Take 10 mg by mouth daily as needed for allergies.    [provider]  melatonin 5 MG TABS Take 5 mg by mouth at bedtime as needed (sleep).     [provider]  metolazone (ZAROXOLYN) 5 MG tablet Take 5 mg by mouth every Monday, Wednesday, and Friday.      [provider]  Multiple Vitamin (MULTIVITAMIN WITH MINERALS) TABS tablet Take 1 tablet by mouth 3 (three) times a week. Men's Multivitamin    [provider]  Multiple Vitamins-Minerals (EYE HEALTH PO) Take 1 capsule by mouth daily.     [provider]  Omega 3-6-9 Fatty Acids (OMEGA-3-6-9 PO) Take 1 capsule by mouth 3 (three) times a week.    [provider]  Polyethyl Glycol-Propyl Glycol (SYSTANE OP) Place 1 drop into both eyes daily as needed (  dry eyes).    [provider]  polyethylene glycol-electrolytes (NULYTELY) 420 g solution Take as directed for colonic prep. 04/30/20   [provider]  sildenafil (VIAGRA) 25 MG tablet Take 1 tablet (25 mg total) by mouth daily as needed for erectile dysfunction. 09/29/20   McGowan, Hunt Oris, PA-C  Testosterone 1.62 % GEL APPLY 1 PUMP ACTUATION TOPICALLY ONCE DAILY IN THE MORNING TO THE SHOULDERS AND UPPER ARMS. 08/26/20   Debroah Loop, PA-C  warfarin (COUMADIN) 5 MG tablet Take 10 mg by mouth daily.  01/11/20   [provider]    Family History Family History  Problem Relation Age of Onset  . Diabetes Father   . Kidney disease Father   . Diabetes Daughter     Social History Social History   Tobacco Use  . Smoking status: Former Smoker    Types: Cigars  . Smokeless tobacco: Former Systems developer    Quit date: 09/30/2017  . Tobacco comment: occasional  Vaping Use  . Vaping Use: Never used  Substance Use Topics  . Alcohol use: Yes    Alcohol/week: 1.0 standard drink    Types: 1 Cans of beer per week    Comment: beer  . Drug use: Not Currently     Allergies   Lisinopril, Furosemide, and Adhesive [tape]   Review of Systems Review of Systems  Constitutional: Negative for fatigue and fever.  HENT: Positive for congestion and rhinorrhea. Negative for sinus pressure, sinus pain and sore throat.   Respiratory: Positive for cough. Negative for shortness of breath.    Gastrointestinal: Negative for abdominal pain, diarrhea, nausea and vomiting.  Musculoskeletal: Negative for myalgias.  Neurological: Negative for weakness, light-headedness and headaches.  Hematological: Negative for adenopathy.     Physical Exam Triage Vital Signs ED Triage Vitals  Enc Vitals Group     BP 01/19/21 1315 (!) 152/66     Pulse Rate 01/19/21 1315 62     Resp 01/19/21 1315 18     Temp 01/19/21 1315 98.4 F (36.9 C)     Temp Source 01/19/21 1315 Oral     SpO2 01/19/21 1315 98 %     Weight 01/19/21 1314 (!) 396 lb 9.7 oz (179.9 kg)     Height 01/19/21 1314 6\' 4"  (1.93 m)     Head Circumference --      Peak Flow --      Pain Score 01/19/21 1314 0     Pain Loc --      Pain Edu? --      Excl. in [place]? --    No data found.  Updated Vital Signs BP (!) 152/66 (BP Location: Right Arm)   Pulse 62   Temp 98.4 F (36.9 C) (Oral)   Resp 18   Ht 6\' 4"  (1.93 m)   Wt (!) 396 lb 9.7 oz (179.9 kg)   SpO2 98%   BMI 48.28 kg/m       Physical Exam Vitals and nursing note reviewed.  Constitutional:      General: He is not in acute distress.    Appearance: Normal appearance. He is well-developed. He is obese. He is not ill-appearing.  HENT:     Head: Normocephalic and atraumatic.     Nose: Congestion and rhinorrhea present.     Mouth/Throat:     Mouth: Mucous membranes are moist.     Pharynx: Oropharynx is clear. No posterior oropharyngeal erythema.  Eyes:     General: No scleral icterus.  Conjunctiva/sclera: Conjunctivae normal.  Cardiovascular:     Rate and Rhythm: Normal rate and regular rhythm.     Heart sounds: Normal heart sounds.  Pulmonary:     Effort: Pulmonary effort is normal. No respiratory distress.     Breath sounds: Normal breath sounds.  Musculoskeletal:     Cervical back: Neck supple.  Skin:    General: Skin is warm and dry.  Neurological:     General: No focal deficit present.     Mental Status: He is alert. Mental status is at baseline.      Motor: No weakness.     Gait: Gait normal.  Psychiatric:        Mood and Affect: Mood normal.        Behavior: Behavior normal.        Thought Content: Thought content normal.      UC Treatments / Results  Labs (all labs ordered are listed, but only abnormal results are displayed) Labs Reviewed  SARS CORONAVIRUS 2 (TAT 6-24 HRS)    EKG   Radiology No results found.  Procedures Procedures (including critical care time)  Medications Ordered in UC Medications - No data to display  Initial Impression / Assessment and Plan / UC Course  I have reviewed the triage vital signs and the nursing notes.  Pertinent labs & imaging results that were available during my care of the patient were reviewed by me and considered in my medical decision making (see chart for details).   50 year old male presenting for cough and congestion for about a week.  Patient has type 1 diabetes and end-stage renal disease on dialysis.  Vaccinated for COVID-19 x3.  Blood pressure is elevated 152/66.  Temperature is normal.  Oxygen is 98%.  Patient is overall well-appearing.  Has minor nasal congestion and rhinorrhea on exam.  Chest is clear to auscultation heart regular rate and rhythm.  COVID test obtained.  Current CDC counts, isolation protocol and ED precautions reviewed with patient.  Work note provided.  Advised likely viral URI, but COVID-19 not ruled out.  Encouraged increasing rest and fluids.  Advised to take decongestants that have been approved by his nephrologist and Tylenol if needed.  Advised to return for any fever, worsening cough, breathing problem or if not better in the next week.   Final Clinical Impressions(s) / UC Diagnoses   Final diagnoses:  Acute upper respiratory infection  Cough  Nasal congestion     Discharge Instructions     You have received COVID testing today either for positive exposure, concerning symptoms that could be related to COVID infection, screening  purposes, or re-testing after confirmed positive.  Your test obtained today checks for active viral infection in the last 1-2 weeks. If your test is negative now, you can still test positive later. So, if you do develop symptoms you should either get re-tested and/or isolate x 5 days and then strict mask use x 5 days (unvaccinated) or mask use x 10 days (vaccinated). Please follow CDC guidelines.  While Rapid antigen tests come back in 15-20 minutes, send out PCR/molecular test results typically come back within 1-3 days. In the mean time, if you are symptomatic, assume this could be a positive test and treat/monitor yourself as if you do have COVID.   We will call with test results if positive. Please download the MyChart app and set up a profile to access test results.   If symptomatic, go home and rest. Push fluids. Take Tylenol  as needed for discomfort. Gargle warm salt water. Throat lozenges. Take Mucinex DM or Robitussin for cough. Humidifier in bedroom to ease coughing. Warm showers. Also review the COVID handout for more information.  COVID-19 INFECTION: The incubation period of COVID-19 is approximately 14 days after exposure, with most symptoms developing in roughly 4-5 days. Symptoms may range in severity from mild to critically severe. Roughly 80% of those infected will have mild symptoms. People of any age may become infected with COVID-19 and have the ability to transmit the virus. The most common symptoms include: fever, fatigue, cough, body aches, headaches, sore throat, nasal congestion, shortness of breath, nausea, vomiting, diarrhea, changes in smell and/or taste.    COURSE OF ILLNESS Some patients may begin with mild disease which can progress quickly into critical symptoms. If your symptoms are worsening please call ahead to the Emergency Department and proceed there for further treatment. Recovery time appears to be roughly 1-2 weeks for mild symptoms and 3-6 weeks for severe  disease.   GO IMMEDIATELY TO ER FOR FEVER YOU ARE UNABLE TO GET DOWN WITH TYLENOL, BREATHING PROBLEMS, CHEST PAIN, FATIGUE, LETHARGY, INABILITY TO EAT OR DRINK, ETC  QUARANTINE AND ISOLATION: To help decrease the spread of COVID-19 please remain isolated if you have COVID infection or are highly suspected to have COVID infection. This means -stay home and isolate to one room in the home if you live with others. Do not share a bed or bathroom with others while ill, sanitize and wipe down all countertops and keep common areas clean and disinfected. Stay home for 5 days. If you have no symptoms or your symptoms are resolving after 5 days, you can leave your house. Continue to wear a mask around others for 5 additional days. If you have been in close contact (within 6 feet) of someone diagnosed with COVID 19, you are advised to quarantine in your home for 14 days as symptoms can develop anywhere from 2-14 days after exposure to the virus. If you develop symptoms, you  must isolate.  Most current guidelines for COVID after exposure -unvaccinated: isolate 5 days and strict mask use x 5 days. Test on day 5 is possible -vaccinated: wear mask x 10 days if symptoms do not develop -You do not necessarily need to be tested for COVID if you have + exposure and  develop symptoms. Just isolate at home x10 days from symptom onset During this global pandemic, CDC advises to practice social distancing, try to stay at least 88ft away from others at all times. Wear a face covering. Wash and sanitize your hands regularly and avoid going anywhere that is not necessary.  KEEP IN MIND THAT THE COVID TEST IS NOT 100% ACCURATE AND YOU SHOULD STILL DO EVERYTHING TO PREVENT POTENTIAL SPREAD OF VIRUS TO OTHERS (WEAR MASK, WEAR GLOVES, [place] HANDS AND SANITIZE REGULARLY). IF INITIAL TEST IS NEGATIVE, THIS MAY NOT MEAN YOU ARE DEFINITELY NEGATIVE. MOST ACCURATE TESTING IS DONE 5-7 DAYS AFTER EXPOSURE.   It is not advised by CDC to  get re-tested after receiving a positive COVID test since you can still test positive for weeks to months after you have already cleared the virus.   *If you have not been vaccinated for COVID, I strongly suggest you consider getting vaccinated as long as there are no contraindications.      ED Prescriptions    None     PDMP not reviewed this encounter.   Danton Clap, PA-C 01/19/21 1434

## 2021-01-19 NOTE — ED Triage Notes (Signed)
Patient c/o cough and nasal congestion that started 1 week ago. Denies fever.

## 2021-01-20 ENCOUNTER — Other Ambulatory Visit: Payer: Self-pay | Admitting: Adult Health

## 2021-01-20 DIAGNOSIS — Z992 Dependence on renal dialysis: Secondary | ICD-10-CM | POA: Diagnosis not present

## 2021-01-20 DIAGNOSIS — Z7901 Long term (current) use of anticoagulants: Secondary | ICD-10-CM | POA: Diagnosis not present

## 2021-01-20 DIAGNOSIS — N186 End stage renal disease: Secondary | ICD-10-CM | POA: Diagnosis not present

## 2021-01-20 DIAGNOSIS — U071 COVID-19: Secondary | ICD-10-CM

## 2021-01-20 LAB — SARS CORONAVIRUS 2 (TAT 6-24 HRS): SARS Coronavirus 2: POSITIVE — AB

## 2021-01-20 NOTE — Progress Notes (Signed)
I connected by phone with [name] on 01/20/2021 at 1:57 PM to discuss the potential use of a new treatment for mild to moderate COVID-19 viral infection in non-hospitalized patients.  This patient is a 50 y.o. male that meets the FDA criteria for Emergency Use Authorization of COVID monoclonal antibody bebtelovimab.  Has a (+) direct SARS-CoV-2 viral test result  Has mild or moderate COVID-19   Is NOT hospitalized due to COVID-19  Is within 10 days of symptom onset  Has at least one of the high risk factor(s) for progression to severe COVID-19 and/or hospitalization as defined in EUA.  Specific high risk criteria : Chronic Kidney Disease (CKD)   Sx onset 01/16/2021  Patient reports dissatisfaction that [facility] doesn't have an infusion clinic near him.   I have spoken and communicated the following to the patient or parent/caregiver regarding COVID monoclonal antibody treatment:  1. FDA has authorized the emergency use for the treatment of mild to moderate COVID-19 in adults and pediatric patients with positive results of direct SARS-CoV-2 viral testing who are 90 years of age and older weighing at least 40 kg, and who are at high risk for progressing to severe COVID-19 and/or hospitalization.  2. The significant known and potential risks and benefits of COVID monoclonal antibody, and the extent to which such potential risks and benefits are unknown.  3. Information on available alternative treatments and the risks and benefits of those alternatives, including clinical trials.  4. Patients treated with COVID monoclonal antibody should continue to self-isolate and use infection control measures (e.g., wear mask, isolate, social distance, avoid sharing personal items, clean and disinfect "high touch" surfaces, and frequent handwashing) according to CDC guidelines.   5. The patient or parent/caregiver has the option to accept or refuse COVID monoclonal antibody treatment.  6. Discussion  about the monoclonal antibody infusion does not ensure treatment. The patient will be placed on a list and scheduled according to risk, symptom onset and availability. A scheduler will reach to the patient to let them know if we can accommodate their infusion or not.  After reviewing this information with the patient, the patient has agreed to receive one of the available covid 19 monoclonal antibodies and will be provided an appropriate fact sheet prior to infusion. Scot Dock, NP 01/20/2021 1:57 PM

## 2021-01-21 ENCOUNTER — Ambulatory Visit: Payer: Medicare HMO

## 2021-01-21 DIAGNOSIS — J1282 Pneumonia due to coronavirus disease 2019: Secondary | ICD-10-CM

## 2021-01-21 MED ORDER — ALBUTEROL SULFATE HFA 108 (90 BASE) MCG/ACT IN AERS: 2.0000 | INHALATION_SPRAY | Freq: Once | RESPIRATORY_TRACT | Status: AC | PRN

## 2021-01-21 MED ORDER — DIPHENHYDRAMINE HCL 50 MG/ML IJ SOLN: 50.0000 mg | Freq: Once | INTRAMUSCULAR | Status: AC | PRN

## 2021-01-21 MED ORDER — SODIUM CHLORIDE 0.9 % IV SOLN: INTRAVENOUS | Status: DC | PRN

## 2021-01-21 MED ORDER — BEBTELOVIMAB 175 MG/2 ML IV (EUA)
175.0000 mg | Freq: Once | INTRAMUSCULAR | Status: AC
  Administered 2021-01-21: 175 mg via INTRAVENOUS

## 2021-01-21 MED ORDER — EPINEPHRINE 0.3 MG/0.3ML IJ SOAJ: 0.3000 mg | Freq: Once | INTRAMUSCULAR | Status: AC | PRN

## 2021-01-21 MED ORDER — FAMOTIDINE IN NACL 20-0.9 MG/50ML-% IV SOLN: 20.0000 mg | Freq: Once | INTRAVENOUS | Status: AC | PRN

## 2021-01-21 MED ORDER — METHYLPREDNISOLONE SODIUM SUCC 125 MG IJ SOLR: 125.0000 mg | Freq: Once | INTRAMUSCULAR | Status: AC | PRN

## 2021-01-21 NOTE — Progress Notes (Signed)
Diagnosis: COVID  Provider:  Marshell Garfinkel, MD  Procedure: Infusion  IV Type: Peripheral, IV Location: R Hand  Bebtelovimab, Dose: 175 mg  Infusion Start Time: 01/21/21 1257  Infusion Stop Time: 01/21/21 1258  Post Infusion IV Care: Observation period completed  Discharge: Condition: Good, Destination: Home . AVS provided to patient.   Performed by:  Jonelle Sidle, RN

## 2021-01-21 NOTE — Patient Instructions (Signed)
What types of side effects do monoclonal antibody drugs cause?  Common side effects  In general, the more common side effects caused by monoclonal antibody drugs include: . Allergic reactions, such as hives or itching . Flu-like signs and symptoms, including chills, fatigue, fever, and muscle aches and pains . Nausea, vomiting . Diarrhea . Skin rashes . Low blood pressure   The CDC is recommending patients who receive monoclonal antibody treatments wait at least 90 days before being vaccinated.  10 Things You Can Do to Manage Your COVID-19 Symptoms at Home If you have possible or confirmed COVID-19: 1. Stay home except to get medical care. 2. Monitor your symptoms carefully. If your symptoms get worse, call your healthcare provider immediately. 3. Get rest and stay hydrated. 4. If you have a medical appointment, call the healthcare provider ahead of time and tell them that you have or may have COVID-19. 5. For medical emergencies, call 911 and notify the dispatch personnel that you have or may have COVID-19. 6. Cover your cough and sneezes with a tissue or use the inside of your elbow. 7. Wash your hands often with soap and water for at least 20 seconds or clean your hands with an alcohol-based hand sanitizer that contains at least 60% alcohol. 8. As much as possible, stay in a specific room and away from other people in your home. Also, you should use a separate bathroom, if available. If you need to be around other people in or outside of the home, wear a mask. 9. Avoid sharing personal items with other people in your household, like dishes, towels, and bedding. 10. Clean all surfaces that are touched often, like counters, tabletops, and doorknobs. Use household cleaning sprays or wipes according to the label instructions. [email or web address] 03/14/2020 This information is not intended to replace advice given to you by your health care provider. Make sure you discuss any questions you  have with your health care provider. Document Revised: 06/30/2020 Document Reviewed: 06/30/2020 Elsevier Patient Education  2021 [employer].

## 2021-01-22 DIAGNOSIS — Z992 Dependence on renal dialysis: Secondary | ICD-10-CM | POA: Diagnosis not present

## 2021-01-22 DIAGNOSIS — N186 End stage renal disease: Secondary | ICD-10-CM | POA: Diagnosis not present

## 2021-01-24 DIAGNOSIS — N186 End stage renal disease: Secondary | ICD-10-CM | POA: Diagnosis not present

## 2021-01-24 DIAGNOSIS — Z992 Dependence on renal dialysis: Secondary | ICD-10-CM | POA: Diagnosis not present

## 2021-01-27 DIAGNOSIS — Z7901 Long term (current) use of anticoagulants: Secondary | ICD-10-CM | POA: Diagnosis not present

## 2021-01-27 DIAGNOSIS — Z992 Dependence on renal dialysis: Secondary | ICD-10-CM | POA: Diagnosis not present

## 2021-01-27 DIAGNOSIS — N186 End stage renal disease: Secondary | ICD-10-CM | POA: Diagnosis not present

## 2021-01-28 ENCOUNTER — Ambulatory Visit: Payer: Medicare HMO | Admitting: Endocrinology

## 2021-01-28 ENCOUNTER — Telehealth: Payer: Self-pay | Admitting: Urology

## 2021-01-28 ENCOUNTER — Other Ambulatory Visit: Payer: Self-pay

## 2021-01-28 VITALS — BP 174/60 | HR 72 | Ht 76.0 in | Wt 390.8 lb

## 2021-01-28 DIAGNOSIS — Z125 Encounter for screening for malignant neoplasm of prostate: Secondary | ICD-10-CM | POA: Diagnosis not present

## 2021-01-28 DIAGNOSIS — E221 Hyperprolactinemia: Secondary | ICD-10-CM

## 2021-01-28 DIAGNOSIS — Z992 Dependence on renal dialysis: Secondary | ICD-10-CM | POA: Diagnosis not present

## 2021-01-28 DIAGNOSIS — N186 End stage renal disease: Secondary | ICD-10-CM | POA: Diagnosis not present

## 2021-01-28 NOTE — Progress Notes (Signed)
Subjective:    Patient ID: [name], male    DOB: June 20, 1971, 50 y.o.   MRN: [id number]  HPI  Pt returns for f/u of hyperprolactinemia (dx'ed 2021; only cause found was ESRD; low testosterone was also noted then; he was rx'ed androgel by [facility]; he has 2 biological children; ON dex test was normal; f/u T was normal on parlodel and gel; MRI (2021) showed 6 mm adenoma).  He last took androgel 1-2 days ago.  ED sxs persist.   Past Medical History:  Diagnosis Date  . Anemia   . Chronic kidney disease    14% FUNCTION  . Diabetes mellitus without complication ([place])   . Diabetic retinopathy ([place])   . Dyspnea    DOE  . Dysrhythmia   . End stage renal disease ([place])   . History of orthopnea    error wrong patietn  . Hypercholesteremia    error  . Hyperkalemia    error wrong patient  . Hypertension   . Long-term insulin use ([place])   . Lymphedema of leg   . Metabolic encephalopathy    error  . Microalbuminuria   . MSSA (methicillin susceptible Staphylococcus aureus)   . Obesity   . PVD (peripheral vascular disease) ([place])   . Respiratory failure ([place])    Error  . Sepsis ([place])    error  . Sleep apnea    no CPAP  . UTI (urinary tract infection)    error  . Venous ulcer ([place])   . Vitamin D deficiency     Past Surgical History:  Procedure Laterality Date  . A/V FISTULAGRAM Right 10/24/2018   Procedure: A/V FISTULAGRAM;  Surgeon: Katha Cabal, MD;  Location: [facility];  Service: Cardiovascular;  Laterality: Right;  . A/V FISTULAGRAM Right 11/24/2018   Procedure: A/V FISTULAGRAM;  Surgeon: Katha Cabal, MD;  Location: [facility];  Service: Cardiovascular;  Laterality: Right;  . A/V FISTULAGRAM Left 07/31/2019   Procedure: A/V FISTULAGRAM;  Surgeon: Katha Cabal, MD;  Location: [facility];  Service: Cardiovascular;  Laterality: Left;  . A/V FISTULAGRAM Left 12/11/2019   Procedure: A/V FISTULAGRAM;  Surgeon: Katha Cabal, MD;   Location: [facility];  Service: Cardiovascular;  Laterality: Left;  . A/V SHUNT INTERVENTION Right 02/21/2019   Procedure: A/V SHUNT INTERVENTION;  Surgeon: Katha Cabal, MD;  Location: [facility];  Service: Cardiovascular;  Laterality: Right;  . APPLICATION OF WOUND VAC Left 10/03/2017   Procedure: APPLICATION OF WOUND VAC;  Surgeon: Algernon Huxley, MD;  Location: [facility];  Service: General;  Laterality: Left;  . APPLICATION OF WOUND VAC Left 10/11/2017   Procedure: APPLICATION OF WOUND VAC;  Surgeon: Katha Cabal, MD;  Location: [facility];  Service: Vascular;  Laterality: Left;  . APPLICATION OF WOUND VAC Left 10/14/2017   Procedure: WOUND VAC CHANGE;  Surgeon: Katha Cabal, MD;  Location: [facility];  Service: Vascular;  Laterality: Left;  left lower leg  . APPLICATION OF WOUND VAC Left 10/18/2017   Procedure: WOUND VAC CHANGE;  Surgeon: Katha Cabal, MD;  Location: [facility];  Service: Vascular;  Laterality: Left;  . APPLICATION OF WOUND VAC Left 10/07/2017   Procedure: APPLICATION OF WOUND VAC;  Surgeon: Katha Cabal, MD;  Location: [facility];  Service: Vascular;  Laterality: Left;  . AV FISTULA INSERTION W/ RF MAGNETIC GUIDANCE Right 07/19/2018   Procedure: AV FISTULA INSERTION W/RF MAGNETIC GUIDANCE;  Surgeon: Katha Cabal, MD;  Location: [place] [facility];  Service: Cardiovascular;  Laterality: Right;  . AV FISTULA PLACEMENT Left 05/11/2019   Procedure: ARTERIOVENOUS (AV) FISTULA CREATION (RADIOCEPHALIC );  Surgeon: Katha Cabal, MD;  Location: [facility];  Service: Vascular;  Laterality: Left;  . AV FISTULA PLACEMENT Left 02/20/2020   Procedure: INSERTION OF ARTERIOVENOUS (AV) GORE-TEX GRAFT LEFT ARM ( FOREARM LOOP );  Surgeon: Katha Cabal, MD;  Location: [facility];  Service: Vascular;  Laterality: Left;  . CATARACT EXTRACTION W/PHACO Left 11/01/2018   Procedure: CATARACT EXTRACTION PHACO AND INTRAOCULAR LENS PLACEMENT (IOC)-LEFT,  DIABETIC-INSULIN DEPENDENT;  Surgeon: Eulogio Bear, MD;  Location: [facility];  Service: Ophthalmology;  Laterality: Left;  [place] 00:41.8 CDE 4.61 Fluid Pack Lot # [id number]  . CATARACT EXTRACTION W/PHACO Right 04/27/2019   Procedure: CATARACT EXTRACTION PHACO AND INTRAOCULAR LENS PLACEMENT (IOC);  Surgeon: Eulogio Bear, MD;  Location: [facility];  Service: Ophthalmology;  Laterality: Right;  [place] 00:34 CDE 1.97 FLUID PACK LOT # [id number]   . COLONOSCOPY WITH PROPOFOL N/A 05/12/2020   Procedure: COLONOSCOPY WITH PROPOFOL;  Surgeon: Tol[place], Benay Pike, MD;  Location: [facility];  Service: Gastroenterology;  Laterality: N/A;  . DIALYSIS/PERMA CATHETER INSERTION N/A 02/23/2019   Procedure: DIALYSIS/PERMA CATHETER INSERTION;  Surgeon: Algernon Huxley, MD;  Location: [place] [facility];  Service: Cardiovascular;  Laterality: N/A;  . DIALYSIS/PERMA CATHETER REMOVAL N/A 04/03/2020   Procedure: DIALYSIS/PERMA CATHETER REMOVAL;  Surgeon: Katha Cabal, MD;  Location: [place] [facility];  Service: Cardiovascular;  Laterality: N/A;  . I & D EXTREMITY Left 10/11/2017   Procedure: IRRIGATION AND DEBRIDEMENT EXTREMITY;  Surgeon: Katha Cabal, MD;  Location: [facility];  Service: Vascular;  Laterality: Left;  . INCISION AND DRAINAGE ABSCESS Right 10/07/2017   Procedure: INCISION AND DRAINAGE ABSCESS;  Surgeon: Katha Cabal, MD;  Location: [facility];  Service: Vascular;  Laterality: Right;  . IRRIGATION AND DEBRIDEMENT ABSCESS Left 10/03/2017   Procedure: IRRIGATION AND DEBRIDEMENT ABSCESS with debridement of skin, soft tissue, muscle 50sq cm;  Surgeon: Algernon Huxley, MD;  Location: [facility];  Service: General;  Laterality: Left;  . TEMPORARY DIALYSIS CATHETER  02/20/2019   Procedure: TEMPORARY DIALYSIS CATHETER;  Surgeon: Katha Cabal, MD;  Location: [place] [facility];  Service: Cardiovascular;;  . UPPER EXTREMITY ANGIOGRAPHY Left 09/18/2019   Procedure: UPPER EXTREMITY ANGIOGRAPHY;  Surgeon:  Katha Cabal, MD;  Location: [place] [facility];  Service: Cardiovascular;  Laterality: Left;    Social History   Socioeconomic History  . Marital status: Divorced    Spouse name: Not on file  . Number of children: Not on file  . Years of education: Not on file  . Highest education level: Not on file  Occupational History  . Not on file  Tobacco Use  . Smoking status: Former Smoker    Types: Cigars  . Smokeless tobacco: Former Systems developer    Quit date: 09/30/2017  . Tobacco comment: occasional  Vaping Use  . Vaping Use: Never used  Substance and Sexual Activity  . Alcohol use: Yes    Alcohol/week: 1.0 standard drink    Types: 1 Cans of beer per week    Comment: beer  . Drug use: Not Currently  . Sexual activity: Not on file  Other Topics Concern  . Not on file  Social History Narrative  . Not on file   Social Determinants of Health   Financial Resource Strain:  Not on file  Food Insecurity: Not on file  Transportation Needs: Not on file  Physical Activity: Not on file  Stress: Not on file  Social Connections: Not on file  Intimate Partner Violence: Not on file    Current Outpatient Medications on File Prior to Visit  Medication Sig Dispense Refill  . allopurinol (ZYLOPRIM) 100 MG tablet Take 100 mg by mouth daily.    [name] amLODipine (NORVASC) 10 MG tablet Take 5 mg by mouth daily as needed (if bp is 190/90 or higher).     [name] ascorbic acid (VITAMIN C) 500 MG tablet Take 500 mg by mouth 3 (three) times a week.    [name] atenolol (TENORMIN) 50 MG tablet Take 25 mg by mouth daily as needed (if bp is 190/90 or higher).     . B Complex-C-Folic Acid (RENA-VITE RX) 1 MG TABS Take 1 tablet by mouth 3 (three) times a week.     . bromocriptine (PARLODEL) 2.5 MG tablet Take 1 tablet (2.5 mg total) by mouth at bedtime. 90 tablet 3  . calcium acetate (PHOSLO) 667 MG capsule Take 1,334-2,001 mg by mouth See admin instructions. Take 2001 mg by mouth with each meal & take 1334 mg by mouth  with each snack.    . cinacalcet (SENSIPAR) 30 MG tablet Take 30 mg by mouth daily.    [name] CINNAMON PO Take 500 mg by mouth 2 (two) times daily.     . DOCOSAHEXAENOIC ACID-EPA PO Take by mouth.    [name] ibuprofen (ADVIL) 200 MG tablet Take 200 mg by mouth every 6 (six) hours as needed for headache or moderate pain.    . Insulin Glargine (LANTUS SOLOSTAR) 100 UNIT/ML Solostar Pen Inject 15 Units into the skin at bedtime.     . lidocaine-prilocaine (EMLA) cream Apply 1 application topically as needed (port access).    [name] loratadine (CLARITIN) 10 MG tablet Take 10 mg by mouth daily as needed for allergies.    . melatonin 5 MG TABS Take 5 mg by mouth at bedtime as needed (sleep).     . metolazone (ZAROXOLYN) 5 MG tablet Take 5 mg by mouth every Monday, Wednesday, and Friday.     . Multiple Vitamin (MULTIVITAMIN WITH MINERALS) TABS tablet Take 1 tablet by mouth 3 (three) times a week. Men's Multivitamin    . Multiple Vitamins-Minerals (EYE HEALTH PO) Take 1 capsule by mouth daily.     Ernestine Conrad 3-6-9 Fatty Acids (OMEGA-3-6-9 PO) Take 1 capsule by mouth 3 (three) times a week.    Vladimir Faster Glycol-Propyl Glycol (SYSTANE OP) Place 1 drop into both eyes daily as needed (dry eyes).    . sildenafil (VIAGRA) 25 MG tablet Take 1 tablet (25 mg total) by mouth daily as needed for erectile dysfunction. 30 tablet 3  . warfarin (COUMADIN) 5 MG tablet Take 10 mg by mouth daily.     [name] GAVILYTE-C 240 g solution Take by mouth as directed.     Current Facility-Administered Medications on File Prior to Visit  Medication Dose Route Frequency Provider Last Rate Last Admin  . 0.9 %  sodium chloride infusion   Intravenous PRN Causey, Charlestine Massed, NP        Allergies  Allergen Reactions  . Lisinopril Swelling    Lips mouth   . Furosemide Cough    Light headed, blurry vision, weakness.  . Adhesive [Tape] Itching    Family History  Problem Relation Age of Onset  . Diabetes Father   .  Kidney disease Father   .  Diabetes Daughter     BP (!) 174/60 (BP Location: Right Arm, Patient Position: Sitting, Cuff Size: Large)   Pulse 72   Ht 6\' 4"  (1.93 m)   Wt (!) 390 lb 12.8 oz (177.3 kg)   SpO2 94%   BMI 47.57 kg/m    Review of Systems Denies sob.      Objective:   Physical Exam VITAL SIGNS:  See vs page GENERAL: no distress Ext: no leg edema.       Assessment & Plan:  Hyperprolactinemia: well-controlled Low testosterone.  This may also normalize also with bromocriptine, and he may no longer need androgel ED: not related to the above.     Patient Instructions  Your blood pressure is high again today.  Please follow this up at dialysis.   Please stop taking the androgel, and redo the blood tests in approx 1 month.   You should follow up the ED symptoms at Urology.  Please come back for a follow-up appointment in 6 months.

## 2021-01-28 NOTE — Patient Instructions (Addendum)
Your blood pressure is high again today.  Please follow this up at dialysis.   Please stop taking the androgel, and redo the blood tests in approx 1 month.   You should follow up the ED symptoms at Urology.  Please come back for a follow-up appointment in 6 months.

## 2021-01-28 NOTE — Telephone Encounter (Signed)
Patient advised to go to the lab in [facility] prior to his follow up appointment with [facility] on 6/6.  Please place the following lab orders for the [facility] lab: testosterone, PSA, HCT, HGB and testosterone .

## 2021-01-29 DIAGNOSIS — N186 End stage renal disease: Secondary | ICD-10-CM | POA: Diagnosis not present

## 2021-01-29 DIAGNOSIS — Z992 Dependence on renal dialysis: Secondary | ICD-10-CM | POA: Diagnosis not present

## 2021-01-30 ENCOUNTER — Other Ambulatory Visit
Admission: RE | Admit: 2021-01-30 | Discharge: 2021-01-30 | Disposition: A | Discharge status: Home / Self Care | Payer: Medicare HMO | Attending: Endocrinology | Admitting: Endocrinology

## 2021-01-30 ENCOUNTER — Other Ambulatory Visit: Payer: Self-pay

## 2021-01-30 DIAGNOSIS — E221 Hyperprolactinemia: Secondary | ICD-10-CM

## 2021-01-30 DIAGNOSIS — Z125 Encounter for screening for malignant neoplasm of prostate: Secondary | ICD-10-CM | POA: Diagnosis not present

## 2021-01-30 LAB — PSA: Prostatic Specific Antigen: 0.71 ng/mL (ref 0.00–4.00)

## 2021-01-30 NOTE — Addendum Note (Signed)
Addended by: Milbert Coulter on: 01/30/2021 08:39 AM   Modules accepted: Orders

## 2021-01-31 DIAGNOSIS — N186 End stage renal disease: Secondary | ICD-10-CM | POA: Diagnosis not present

## 2021-01-31 DIAGNOSIS — Z992 Dependence on renal dialysis: Secondary | ICD-10-CM | POA: Diagnosis not present

## 2021-01-31 LAB — PROLACTIN: Prolactin: 15.7 ng/mL — ABNORMAL HIGH (ref 4.0–15.2)

## 2021-02-01 ENCOUNTER — Other Ambulatory Visit: Payer: Self-pay | Admitting: Endocrinology

## 2021-02-01 DIAGNOSIS — R7989 Other specified abnormal findings of blood chemistry: Secondary | ICD-10-CM

## 2021-02-01 DIAGNOSIS — E221 Hyperprolactinemia: Secondary | ICD-10-CM

## 2021-02-01 LAB — TESTOSTERONE,FREE AND TOTAL
Testosterone, Free: 23.2 pg/mL (ref 7.2–24.0)
Testosterone: 467 ng/dL (ref 264–916)

## 2021-02-01 NOTE — Progress Notes (Signed)
02/02/2021 11:51 AM   [name] 06-28-1971 [id number]  Referring provider: [name], MD [place],  [place] [address]  Chief Complaint  Patient presents with  . Follow-up    72mth follow-up    Urological history: 1. Increased prolactin - pituitary MRI 04/2020 6 mm focus of transient hypoenhancement within the inferior right aspect of the pituitary gland. This finding is suspicious for a pituitary microadenoma given the provided history. Additionally, there is subtle leftward deviation of the pituitary stalk. - followed by endocrinology   2. Testosterone deficiency - testosterone level 467 01/2021 - therapy on hold at this time   3. ED - SHIM 6 - contributing factors of age, testosterone deficiency, DM, HTN, sleep apnea, history of smoking and ESRD - failed PDE5i's   4. BPH  -PSA 0.71 in 01/2021   5. Sleep apnea -untreated   HPI: [name] is a 50 y.o. male who presents for a 6 month follow up.    Patient is not having spontaneous erections.  He denies any pain or curvature with erections.     SHIM    Row Name 02/02/21 1133         SHIM: Over the last 6 months:   How do you rate your confidence that you could get and keep an erection? Very Low     When you had erections with sexual stimulation, how often were your erections hard enough for penetration (entering your partner)? Almost Never or Never     During sexual intercourse, how often were you able to maintain your erection after you had penetrated (entered) your partner? Almost Never or Never     During sexual intercourse, how difficult was it to maintain your erection to completion of intercourse? Extremely Difficult     When you attempted sexual intercourse, how often was it satisfactory for you? A Few Times (much less than half the time)           SHIM Total Score   SHIM 6            Score: 1-7 Severe ED 8-11 Moderate ED 12-16 Mild-Moderate ED 17-21 Mild ED 22-25 No  ED   PMH: Past Medical History:  Diagnosis Date  . Anemia   . Chronic kidney disease    14% FUNCTION  . Diabetes mellitus without complication ([place])   . Diabetic retinopathy ([place])   . Dyspnea    DOE  . Dysrhythmia   . End stage renal disease ([place])   . History of orthopnea    error wrong patietn  . Hypercholesteremia    error  . Hyperkalemia    error wrong patient  . Hypertension   . Long-term insulin use ([place])   . Lymphedema of leg   . Metabolic encephalopathy    error  . Microalbuminuria   . MSSA (methicillin susceptible Staphylococcus aureus)   . Obesity   . PVD (peripheral vascular disease) ([place])   . Respiratory failure ([place])    Error  . Sepsis ([place])    error  . Sleep apnea    no CPAP  . UTI (urinary tract infection)    error  . Venous ulcer ([place])   . Vitamin D deficiency     Surgical History: Past Surgical History:  Procedure Laterality Date  . A/V FISTULAGRAM Right 10/24/2018   Procedure: A/V FISTULAGRAM;  Surgeon: Katha Cabal, MD;  Location: [facility];  Service: Cardiovascular;  Laterality: Right;  .  A/V FISTULAGRAM Right 11/24/2018   Procedure: A/V FISTULAGRAM;  Surgeon: Katha Cabal, MD;  Location: [facility];  Service: Cardiovascular;  Laterality: Right;  . A/V FISTULAGRAM Left 07/31/2019   Procedure: A/V FISTULAGRAM;  Surgeon: Katha Cabal, MD;  Location: [facility];  Service: Cardiovascular;  Laterality: Left;  . A/V FISTULAGRAM Left 12/11/2019   Procedure: A/V FISTULAGRAM;  Surgeon: Katha Cabal, MD;  Location: [facility];  Service: Cardiovascular;  Laterality: Left;  . A/V SHUNT INTERVENTION Right 02/21/2019   Procedure: A/V SHUNT INTERVENTION;  Surgeon: Katha Cabal, MD;  Location: [facility];  Service: Cardiovascular;  Laterality: Right;  . APPLICATION OF WOUND VAC Left 10/03/2017   Procedure: APPLICATION OF WOUND VAC;  Surgeon: Algernon Huxley, MD;  Location: [facility];   Service: General;  Laterality: Left;  . APPLICATION OF WOUND VAC Left 10/11/2017   Procedure: APPLICATION OF WOUND VAC;  Surgeon: Katha Cabal, MD;  Location: [facility];  Service: Vascular;  Laterality: Left;  . APPLICATION OF WOUND VAC Left 10/14/2017   Procedure: WOUND VAC CHANGE;  Surgeon: Katha Cabal, MD;  Location: [facility];  Service: Vascular;  Laterality: Left;  left lower leg  . APPLICATION OF WOUND VAC Left 10/18/2017   Procedure: WOUND VAC CHANGE;  Surgeon: Katha Cabal, MD;  Location: [facility];  Service: Vascular;  Laterality: Left;  . APPLICATION OF WOUND VAC Left 10/07/2017   Procedure: APPLICATION OF WOUND VAC;  Surgeon: Katha Cabal, MD;  Location: [facility];  Service: Vascular;  Laterality: Left;  . AV FISTULA INSERTION W/ RF MAGNETIC GUIDANCE Right 07/19/2018   Procedure: AV FISTULA INSERTION W/RF MAGNETIC GUIDANCE;  Surgeon: Katha Cabal, MD;  Location: [facility];  Service: Cardiovascular;  Laterality: Right;  . AV FISTULA PLACEMENT Left 05/11/2019   Procedure: ARTERIOVENOUS (AV) FISTULA CREATION (RADIOCEPHALIC );  Surgeon: Katha Cabal, MD;  Location: [facility];  Service: Vascular;  Laterality: Left;  . AV FISTULA PLACEMENT Left 02/20/2020   Procedure: INSERTION OF ARTERIOVENOUS (AV) GORE-TEX GRAFT LEFT ARM ( FOREARM LOOP );  Surgeon: Katha Cabal, MD;  Location: [facility];  Service: Vascular;  Laterality: Left;  . CATARACT EXTRACTION W/PHACO Left 11/01/2018   Procedure: CATARACT EXTRACTION PHACO AND INTRAOCULAR LENS PLACEMENT (IOC)-LEFT, DIABETIC-INSULIN DEPENDENT;  Surgeon: Eulogio Bear, MD;  Location: [facility];  Service: Ophthalmology;  Laterality: Left;  [place] 00:41.8 CDE 4.61 Fluid Pack Lot # [id number]  . CATARACT EXTRACTION W/PHACO Right 04/27/2019   Procedure: CATARACT EXTRACTION PHACO AND INTRAOCULAR LENS PLACEMENT (IOC);  Surgeon: Eulogio Bear, MD;  Location: [facility];  Service: Ophthalmology;  Laterality: Right;  [place]  00:34 CDE 1.97 FLUID PACK LOT # [id number]   . COLONOSCOPY WITH PROPOFOL N/A 05/12/2020   Procedure: COLONOSCOPY WITH PROPOFOL;  Surgeon: Toledo, Benay Pike, MD;  Location: [facility];  Service: Gastroenterology;  Laterality: N/A;  . DIALYSIS/PERMA CATHETER INSERTION N/A 02/23/2019   Procedure: DIALYSIS/PERMA CATHETER INSERTION;  Surgeon: Algernon Huxley, MD;  Location: [facility];  Service: Cardiovascular;  Laterality: N/A;  . DIALYSIS/PERMA CATHETER REMOVAL N/A 04/03/2020   Procedure: DIALYSIS/PERMA CATHETER REMOVAL;  Surgeon: Katha Cabal, MD;  Location: [facility];  Service: Cardiovascular;  Laterality: N/A;  . I & D EXTREMITY Left 10/11/2017   Procedure: IRRIGATION AND DEBRIDEMENT EXTREMITY;  Surgeon: Katha Cabal, MD;  Location: [facility];  Service: Vascular;  Laterality: Left;  . INCISION AND  DRAINAGE ABSCESS Right 10/07/2017   Procedure: INCISION AND DRAINAGE ABSCESS;  Surgeon: Katha Cabal, MD;  Location: [facility];  Service: Vascular;  Laterality: Right;  . IRRIGATION AND DEBRIDEMENT ABSCESS Left 10/03/2017   Procedure: IRRIGATION AND DEBRIDEMENT ABSCESS with debridement of skin, soft tissue, muscle 50sq cm;  Surgeon: Algernon Huxley, MD;  Location: [facility];  Service: General;  Laterality: Left;  . TEMPORARY DIALYSIS CATHETER  02/20/2019   Procedure: TEMPORARY DIALYSIS CATHETER;  Surgeon: Katha Cabal, MD;  Location: [facility];  Service: Cardiovascular;;  . UPPER EXTREMITY ANGIOGRAPHY Left 09/18/2019   Procedure: UPPER EXTREMITY ANGIOGRAPHY;  Surgeon: Katha Cabal, MD;  Location: [facility];  Service: Cardiovascular;  Laterality: Left;    Home Medications:  Allergies as of 02/02/2021      Reactions   Lisinopril Swelling   Lips mouth    Furosemide Cough   Light headed, blurry vision, weakness.   Adhesive [tape] Itching      Medication List       Accurate as of February 02, 2021 11:51 AM. If you have any questions, ask your  nurse or doctor.        allopurinol 100 MG tablet Commonly known as: ZYLOPRIM Take 100 mg by mouth daily.   amLODipine 10 MG tablet Commonly known as: NORVASC Take 5 mg by mouth daily as needed (if bp is 190/90 or higher).   ascorbic acid 500 MG tablet Commonly known as: VITAMIN C Take 500 mg by mouth 3 (three) times a week.   atenolol 50 MG tablet Commonly known as: TENORMIN Take 25 mg by mouth daily as needed (if bp is 190/90 or higher).   bromocriptine 2.5 MG tablet Commonly known as: Parlodel Take 1 tablet (2.5 mg total) by mouth at bedtime.   calcium acetate 667 MG capsule Commonly known as: PHOSLO Take 1,334-2,001 mg by mouth See admin instructions. Take 2001 mg by mouth with each meal & take 1334 mg by mouth with each snack.   cinacalcet 30 MG tablet Commonly known as: SENSIPAR Take 30 mg by mouth daily.   CINNAMON PO Take 500 mg by mouth 2 (two) times daily.   DOCOSAHEXAENOIC ACID-EPA PO Take by mouth.   EYE HEALTH PO Take 1 capsule by mouth daily.   GaviLyte-C 240 g solution Generic drug: polyethylene glycol Take by mouth as directed.   ibuprofen 200 MG tablet Commonly known as: ADVIL Take 200 mg by mouth every 6 (six) hours as needed for headache or moderate pain.   Lantus SoloStar 100 UNIT/ML Solostar Pen Generic drug: insulin glargine Inject 15 Units into the skin at bedtime.   lidocaine-prilocaine cream Commonly known as: EMLA Apply 1 application topically as needed (port access).   loratadine 10 MG tablet Commonly known as: CLARITIN Take 10 mg by mouth daily as needed for allergies.   melatonin 5 MG Tabs Take 5 mg by mouth at bedtime as needed (sleep).   metolazone 5 MG tablet Commonly known as: ZAROXOLYN Take 5 mg by mouth every Monday, Wednesday, and Friday.   multivitamin with minerals Tabs tablet Take 1 tablet by mouth 3 (three) times a week. Men's Multivitamin   OMEGA-3-6-9 PO Take 1 capsule by mouth 3 (three) times a week.    Rena-Vite Rx 1 MG Tabs Take 1 tablet by mouth 3 (three) times a week.   sildenafil 25 MG tablet Commonly known as: VIAGRA Take 1 tablet (25 mg total) by mouth daily as needed for  erectile dysfunction.   SYSTANE OP Place 1 drop into both eyes daily as needed (dry eyes).   warfarin 5 MG tablet Commonly known as: COUMADIN Take 10 mg by mouth daily.       Allergies:  Allergies  Allergen Reactions  . Lisinopril Swelling    Lips mouth   . Furosemide Cough    Light headed, blurry vision, weakness.  . Adhesive [Tape] Itching    Family History: Family History  Problem Relation Age of Onset  . Diabetes Father   . Kidney disease Father   . Diabetes Daughter     Social History:  reports that he has quit smoking. His smoking use included cigars. He quit smokeless tobacco use about 3 years ago. He reports current alcohol use of about 1.0 standard drink of alcohol per week. He reports previous drug use.  ROS: Pertinent ROS in HPI  Physical Exam: BP (!) 228/83   Pulse 73   Ht 6\' 4"  (1.93 m)   Wt (!) 396 lb (179.6 kg)   BMI 48.20 kg/m   Constitutional:  Well nourished. Alert and oriented, No acute distress. HEENT: [place] AT, mask in place.  Trachea midline Cardiovascular: No clubbing, cyanosis, or edema. Respiratory: Normal respiratory effort, no increased work of breathing. Neurologic: Grossly intact, no focal deficits, moving all 4 extremities. Psychiatric: Normal mood and affect.   Laboratory Data: Results for orders placed or performed during the hospital encounter of 01/30/21  Testosterone,Free and Total  Result Value Ref Range   Testosterone 467 264 - 916 ng/dL   Testosterone, Free 23.2 7.2 - 24.0 pg/mL  Prolactin  Result Value Ref Range   Prolactin 15.7 (H) 4.0 - 15.2 ng/mL  PSA  Result Value Ref Range   Prostatic Specific Antigen 0.71 0.00 - 4.00 ng/mL  I have reviewed the labs.   Pertinent Imaging: No recent imaging  Assessment & Plan:    1.  Testosterone deficiency  -patient's therapy is on hold at this time to see if his testosterone level normalizes with the increase in his bromocriptine -he is having follow up blood with with his endocrinologist in one months  2. Erectile dysfunction:    -failed PDE5i's  -he did some research regarding ICI and would like to proceed with titration   3. Elevated Prolactin/Pituitary microadenoma - managed by endocrinology    Return for next Monday at 8am for Edex titration .  These notes generated with voice recognition software. I apologize for typographical errors.  Zara Council, PA-C  [facility] [address]  [place], [place] [address] [phone]

## 2021-02-02 ENCOUNTER — Encounter: Payer: Self-pay | Admitting: Urology

## 2021-02-02 ENCOUNTER — Other Ambulatory Visit: Payer: Self-pay

## 2021-02-02 ENCOUNTER — Telehealth: Payer: Self-pay | Admitting: Endocrinology

## 2021-02-02 ENCOUNTER — Ambulatory Visit (INDEPENDENT_AMBULATORY_CARE_PROVIDER_SITE_OTHER): Payer: Medicare HMO | Admitting: Urology

## 2021-02-02 VITALS — BP 228/83 | HR 73 | Ht 76.0 in | Wt 396.0 lb

## 2021-02-02 DIAGNOSIS — Z6841 Body Mass Index (BMI) 40.0 and over, adult: Secondary | ICD-10-CM | POA: Diagnosis not present

## 2021-02-02 DIAGNOSIS — N529 Male erectile dysfunction, unspecified: Secondary | ICD-10-CM

## 2021-02-02 DIAGNOSIS — R972 Elevated prostate specific antigen [PSA]: Secondary | ICD-10-CM | POA: Diagnosis not present

## 2021-02-02 DIAGNOSIS — E349 Endocrine disorder, unspecified: Secondary | ICD-10-CM | POA: Diagnosis not present

## 2021-02-02 NOTE — Telephone Encounter (Signed)
Pt calling and wants to let provider know that he agree's with taking the bromocriptine (PARLODEL) 2.5 MG tablet

## 2021-02-03 DIAGNOSIS — Z992 Dependence on renal dialysis: Secondary | ICD-10-CM | POA: Diagnosis not present

## 2021-02-03 DIAGNOSIS — N186 End stage renal disease: Secondary | ICD-10-CM | POA: Diagnosis not present

## 2021-02-03 DIAGNOSIS — Z7901 Long term (current) use of anticoagulants: Secondary | ICD-10-CM | POA: Diagnosis not present

## 2021-02-03 NOTE — Telephone Encounter (Signed)
Called and left a message for pt to call back to discuss recommendations.

## 2021-02-05 DIAGNOSIS — N186 End stage renal disease: Secondary | ICD-10-CM | POA: Diagnosis not present

## 2021-02-05 DIAGNOSIS — Z992 Dependence on renal dialysis: Secondary | ICD-10-CM | POA: Diagnosis not present

## 2021-02-06 NOTE — Telephone Encounter (Signed)
Pt returning call regarding on how much to take of the medication Bromocriptine. Pt works nights. Pt voiced that Nurse can leave a detailed voicemail on what dose to take.

## 2021-02-07 ENCOUNTER — Other Ambulatory Visit: Payer: Self-pay | Admitting: Urology

## 2021-02-07 DIAGNOSIS — N186 End stage renal disease: Secondary | ICD-10-CM | POA: Diagnosis not present

## 2021-02-07 DIAGNOSIS — Z992 Dependence on renal dialysis: Secondary | ICD-10-CM | POA: Diagnosis not present

## 2021-02-08 NOTE — Progress Notes (Signed)
    02/09/2021 8:36 AM   [name] 07/14/1971 [id number]  Referring provider: [name], MD [place],  [place] [address]  Chief Complaint  Patient presents with   Follow-up    Edex     Urological history: 1. Increased prolactin - pituitary MRI 04/2020 6 mm focus of transient hypoenhancement within the inferior right aspect of the pituitary gland. This finding is suspicious for a pituitary microadenoma given the provided history. Additionally, there is subtle leftward deviation of the pituitary stalk. - followed by endocrinology   2. Testosterone deficiency - testosterone level 467 01/2021 - therapy on hold at this time   3. ED - SHIM 6 - contributing factors of age, testosterone deficiency, DM, HTN, sleep apnea, history of smoking and ESRD - failed PDE5i's   4. BPH  -PSA 0.71 in 01/2021   5. Sleep apnea -untreated    CC: ED  HPI: [name] is a 50 y.o. male who presents today for an Edex titration.   Physical Exam:  BP (!) 152/74   Pulse 61   Ht 6\' 4"  (1.93 m)   Wt (!) 395 lb (179.2 kg)   BMI 48.08 kg/m   Constitutional:  Well nourished. Alert and oriented, No acute distress. GU: No CVA tenderness.  No bladder fullness or masses.  Patient with circumcised phallus.  Urethral meatus is patent.  No penile discharge. No penile lesions or rashes. Scrotum without lesions, cysts, rashes and/or edema.  Psychiatric: Normal mood and affect.   Procedure  Patient's left corpus cavernosum is identified.  An area near the base of the penis is cleansed with rubbing alcohol.  Careful to avoid the dorsal vein, 0.25 of Edex  Lot # [id number] exp # 11/2021 is injected at a 90 degree angle into the left corpus cavernosum near the base of the penis.  Patient experienced penile fullness in 15 minutes.    Patient's right corpus cavernosum is identified.  An area near the base of the penis is cleansed with rubbing alcohol.  Careful to avoid the dorsal vein, 0.25  of Edex  Lot # [id number] exp # 11/2021 is injected at a 90 degree angle into the right corpus cavernosum near the base of the penis.  Patient experienced an erection in 15 minutes.     Assessment & Plan:    1. ED -patient will contact me tomorrow regarding if he feels his erection is satisfactory enough for intercourse  -Advised patient of the condition of priapism, painful erection lasting for more than four hours, and to contact the office immediately or seek treatment in the ED     No follow-ups on file.  Laneta Simmers  [facility] [address] [place], [place] [address] [phone]

## 2021-02-09 ENCOUNTER — Ambulatory Visit (INDEPENDENT_AMBULATORY_CARE_PROVIDER_SITE_OTHER): Payer: Medicare HMO | Admitting: Urology

## 2021-02-09 ENCOUNTER — Encounter: Payer: Self-pay | Admitting: Urology

## 2021-02-09 ENCOUNTER — Other Ambulatory Visit: Payer: Self-pay

## 2021-02-09 VITALS — BP 152/74 | HR 61 | Ht 76.0 in | Wt 395.0 lb

## 2021-02-09 DIAGNOSIS — N529 Male erectile dysfunction, unspecified: Secondary | ICD-10-CM

## 2021-02-10 ENCOUNTER — Telehealth: Payer: Self-pay | Admitting: Urology

## 2021-02-10 DIAGNOSIS — N186 End stage renal disease: Secondary | ICD-10-CM | POA: Diagnosis not present

## 2021-02-10 DIAGNOSIS — Z7901 Long term (current) use of anticoagulants: Secondary | ICD-10-CM | POA: Diagnosis not present

## 2021-02-10 DIAGNOSIS — Z992 Dependence on renal dialysis: Secondary | ICD-10-CM | POA: Diagnosis not present

## 2021-02-10 NOTE — Telephone Encounter (Signed)
I left a message with [name] on his voicemail.  I need to know if the injection that we gave him was effective, so that I can dose him properly.

## 2021-02-10 NOTE — Telephone Encounter (Signed)
Patient called back and left a voicemail that he was good. He said he was told to call back to let you know.  Sharyn Lull

## 2021-02-11 NOTE — Telephone Encounter (Signed)
Patient called the office today and left a voice mail message that the injection that was given on Monday was successful.  He was pleased with the results.

## 2021-02-12 DIAGNOSIS — N186 End stage renal disease: Secondary | ICD-10-CM | POA: Diagnosis not present

## 2021-02-12 DIAGNOSIS — Z992 Dependence on renal dialysis: Secondary | ICD-10-CM | POA: Diagnosis not present

## 2021-02-13 ENCOUNTER — Other Ambulatory Visit: Payer: Self-pay | Admitting: Urology

## 2021-02-13 MED ORDER — NONFORMULARY OR COMPOUNDED ITEM: 0 refills | Status: DC

## 2021-02-13 NOTE — Progress Notes (Signed)
It looks like his insurance will not cover the Edex, which is the medication I injected in the office.  We will need to send a prescription for Trimix 30/1/10 [facility] [place]  This is a little different as it has 2 other medications and it besides the one I used the Edex injection in the office.  Is he comfortable injecting himself with the medication?

## 2021-02-13 NOTE — Telephone Encounter (Signed)
The test dose well, with 3 1 mL vials that cost around $100.  Depending on how much of the medication we need to inject have the effect would determine how much it would cost him ultimately.  The needles are different from what I injected with the Edex, so if he would like we can schedule a morning appointment and he can bring that medication with him and I can demonstrate how to inject it.

## 2021-02-13 NOTE — Telephone Encounter (Signed)
Spoke with patient and he will try and pickup tomorrow or Monday injection  [facility]

## 2021-02-13 NOTE — Telephone Encounter (Signed)
Pt calling asking if he can pick up dosing of new medication? Pt states the medication works fine.

## 2021-02-13 NOTE — Telephone Encounter (Signed)
LVM for pt regarding medication.   

## 2021-02-13 NOTE — Telephone Encounter (Signed)
Nori Riis PA-C Physician Assistant Certified        1:49 AM    Edit   Copy      It looks like his insurance will not cover the Edex, which is the medication I injected in the office.  We will need to send a prescription for Trimix 30/1/10 [facility] [place].  This is a little different as it has 2 other medications and it besides the one I used the Edex injection in the office.  Is he comfortable injecting himself with the medication?

## 2021-02-13 NOTE — Telephone Encounter (Signed)
Spoke with patient and he would like to try the trimix, pt thinks he may be able to give to himself but not sure, pt wanted to know if the needles will be included? I offered pt appt in office to discuss how to give injections. Please advise if dose is correct, rx pended.

## 2021-02-14 DIAGNOSIS — N186 End stage renal disease: Secondary | ICD-10-CM | POA: Diagnosis not present

## 2021-02-14 DIAGNOSIS — Z992 Dependence on renal dialysis: Secondary | ICD-10-CM | POA: Diagnosis not present

## 2021-02-15 NOTE — Progress Notes (Signed)
    02/16/2021 12:26 PM   Ignacio A Labrecque 02/11/1971 [id number]  Referring provider: [name], MD [place],  [place] [address]  Chief Complaint  Patient presents with   Follow-up    Trimix teaching      Urological history: 1. Increased prolactin - pituitary MRI 04/2020 6 mm focus of transient hypoenhancement within the inferior right aspect of the pituitary gland. This finding is suspicious for a pituitary microadenoma given the provided history. Additionally, there is subtle leftward deviation of the pituitary stalk. - followed by endocrinology   2. Testosterone deficiency - testosterone level 467 01/2021 - therapy on hold at this time   3. ED - SHIM 6 - contributing factors of age, testosterone deficiency, DM, HTN, sleep apnea, history of smoking and ESRD - failed PDE5i's   4. BPH  -PSA 0.71 in 01/2021   5. Sleep apnea -untreated    CC: ED  HPI: [name] is a 50 y.o. male who presents today for an Edex titration.   Physical Exam:  BP (!) 158/68   Pulse 83   Ht 6\' 4"  (1.93 m)   Wt (!) 395 lb (179.2 kg)   BMI 48.08 kg/m   Constitutional:  Well nourished. Alert and oriented, No acute distress. HEENT: [place] AT, mask in place  Trachea midline Cardiovascular: No clubbing, cyanosis, or edema. Respiratory: Normal respiratory effort, no increased work of breathing. GU: No CVA tenderness.  No bladder fullness or masses.  Patient with circumcised phallus.  Urethral meatus is patent.  No penile discharge. No penile lesions or rashes. Scrotum without lesions, cysts, rashes and/or edema.   Neurologic: Grossly intact, no focal deficits, moving all 4 extremities. Psychiatric: Normal mood and affect.   Procedure  Patient's left corpus cavernosum is identified.  An area near the base of the penis is cleansed with rubbing alcohol.  Careful to avoid the dorsal vein, 0.3 of Trimix 30/1/10, Lot # [id number]  exp # 03/27/2021 is injected at a 90 degree angle  into the left corpus cavernosum near the base of the penis.  Patient experienced a firm erection in 15 minutes.     Assessment & Plan:    1. ED -patient will contact me tomorrow regarding if he feels his erection is satisfactory enough for intercourse  -Advised patient of the condition of priapism, painful erection lasting for more than four hours, and to contact the office immediately or seek treatment in the ED     Return if symptoms worsen or fail to improve.  04/09/2021  [facility] [address] [place], [place] [phone]

## 2021-02-16 ENCOUNTER — Ambulatory Visit (INDEPENDENT_AMBULATORY_CARE_PROVIDER_SITE_OTHER): Payer: Medicare HMO | Admitting: Urology

## 2021-02-16 ENCOUNTER — Encounter: Payer: Self-pay | Admitting: Urology

## 2021-02-16 ENCOUNTER — Other Ambulatory Visit: Payer: Self-pay

## 2021-02-16 VITALS — BP 158/68 | HR 83 | Ht 76.0 in | Wt 395.0 lb

## 2021-02-16 DIAGNOSIS — N529 Male erectile dysfunction, unspecified: Secondary | ICD-10-CM | POA: Diagnosis not present

## 2021-02-17 DIAGNOSIS — N186 End stage renal disease: Secondary | ICD-10-CM | POA: Diagnosis not present

## 2021-02-17 DIAGNOSIS — Z992 Dependence on renal dialysis: Secondary | ICD-10-CM | POA: Diagnosis not present

## 2021-02-18 DIAGNOSIS — Z992 Dependence on renal dialysis: Secondary | ICD-10-CM | POA: Diagnosis not present

## 2021-02-18 DIAGNOSIS — N186 End stage renal disease: Secondary | ICD-10-CM | POA: Diagnosis not present

## 2021-02-19 DIAGNOSIS — N186 End stage renal disease: Secondary | ICD-10-CM | POA: Diagnosis not present

## 2021-02-19 DIAGNOSIS — Z992 Dependence on renal dialysis: Secondary | ICD-10-CM | POA: Diagnosis not present

## 2021-02-21 DIAGNOSIS — Z992 Dependence on renal dialysis: Secondary | ICD-10-CM | POA: Diagnosis not present

## 2021-02-21 DIAGNOSIS — N186 End stage renal disease: Secondary | ICD-10-CM | POA: Diagnosis not present

## 2021-02-24 DIAGNOSIS — N186 End stage renal disease: Secondary | ICD-10-CM | POA: Diagnosis not present

## 2021-02-24 DIAGNOSIS — Z992 Dependence on renal dialysis: Secondary | ICD-10-CM | POA: Diagnosis not present

## 2021-02-26 DIAGNOSIS — N186 End stage renal disease: Secondary | ICD-10-CM | POA: Diagnosis not present

## 2021-02-26 DIAGNOSIS — Z992 Dependence on renal dialysis: Secondary | ICD-10-CM | POA: Diagnosis not present

## 2021-02-27 DIAGNOSIS — Z992 Dependence on renal dialysis: Secondary | ICD-10-CM | POA: Diagnosis not present

## 2021-02-27 DIAGNOSIS — N186 End stage renal disease: Secondary | ICD-10-CM | POA: Diagnosis not present

## 2021-02-28 DIAGNOSIS — Z992 Dependence on renal dialysis: Secondary | ICD-10-CM | POA: Diagnosis not present

## 2021-02-28 DIAGNOSIS — N186 End stage renal disease: Secondary | ICD-10-CM | POA: Diagnosis not present

## 2021-03-03 DIAGNOSIS — Z992 Dependence on renal dialysis: Secondary | ICD-10-CM | POA: Diagnosis not present

## 2021-03-03 DIAGNOSIS — Z7901 Long term (current) use of anticoagulants: Secondary | ICD-10-CM | POA: Diagnosis not present

## 2021-03-03 DIAGNOSIS — N186 End stage renal disease: Secondary | ICD-10-CM | POA: Diagnosis not present

## 2021-03-05 DIAGNOSIS — Z992 Dependence on renal dialysis: Secondary | ICD-10-CM | POA: Diagnosis not present

## 2021-03-05 DIAGNOSIS — N186 End stage renal disease: Secondary | ICD-10-CM | POA: Diagnosis not present

## 2021-03-07 DIAGNOSIS — N186 End stage renal disease: Secondary | ICD-10-CM | POA: Diagnosis not present

## 2021-03-07 DIAGNOSIS — Z992 Dependence on renal dialysis: Secondary | ICD-10-CM | POA: Diagnosis not present

## 2021-03-10 DIAGNOSIS — Z992 Dependence on renal dialysis: Secondary | ICD-10-CM | POA: Diagnosis not present

## 2021-03-10 DIAGNOSIS — N186 End stage renal disease: Secondary | ICD-10-CM | POA: Diagnosis not present

## 2021-03-10 DIAGNOSIS — Z7901 Long term (current) use of anticoagulants: Secondary | ICD-10-CM | POA: Diagnosis not present

## 2021-03-12 DIAGNOSIS — Z992 Dependence on renal dialysis: Secondary | ICD-10-CM | POA: Diagnosis not present

## 2021-03-12 DIAGNOSIS — N186 End stage renal disease: Secondary | ICD-10-CM | POA: Diagnosis not present

## 2021-03-14 DIAGNOSIS — N186 End stage renal disease: Secondary | ICD-10-CM | POA: Diagnosis not present

## 2021-03-14 DIAGNOSIS — Z992 Dependence on renal dialysis: Secondary | ICD-10-CM | POA: Diagnosis not present

## 2021-03-17 DIAGNOSIS — Z7901 Long term (current) use of anticoagulants: Secondary | ICD-10-CM | POA: Diagnosis not present

## 2021-03-17 DIAGNOSIS — Z992 Dependence on renal dialysis: Secondary | ICD-10-CM | POA: Diagnosis not present

## 2021-03-17 DIAGNOSIS — N186 End stage renal disease: Secondary | ICD-10-CM | POA: Diagnosis not present

## 2021-03-19 DIAGNOSIS — N186 End stage renal disease: Secondary | ICD-10-CM | POA: Diagnosis not present

## 2021-03-19 DIAGNOSIS — Z992 Dependence on renal dialysis: Secondary | ICD-10-CM | POA: Diagnosis not present

## 2021-03-20 DIAGNOSIS — N186 End stage renal disease: Secondary | ICD-10-CM | POA: Diagnosis not present

## 2021-03-20 DIAGNOSIS — Z992 Dependence on renal dialysis: Secondary | ICD-10-CM | POA: Diagnosis not present

## 2021-03-21 DIAGNOSIS — Z992 Dependence on renal dialysis: Secondary | ICD-10-CM | POA: Diagnosis not present

## 2021-03-21 DIAGNOSIS — N186 End stage renal disease: Secondary | ICD-10-CM | POA: Diagnosis not present

## 2021-03-23 ENCOUNTER — Ambulatory Visit: Payer: Medicare HMO | Admitting: Urology

## 2021-03-24 DIAGNOSIS — Z992 Dependence on renal dialysis: Secondary | ICD-10-CM | POA: Diagnosis not present

## 2021-03-24 DIAGNOSIS — E119 Type 2 diabetes mellitus without complications: Secondary | ICD-10-CM | POA: Diagnosis not present

## 2021-03-24 DIAGNOSIS — Z7901 Long term (current) use of anticoagulants: Secondary | ICD-10-CM | POA: Diagnosis not present

## 2021-03-24 DIAGNOSIS — N186 End stage renal disease: Secondary | ICD-10-CM | POA: Diagnosis not present

## 2021-03-24 DIAGNOSIS — Z794 Long term (current) use of insulin: Secondary | ICD-10-CM | POA: Diagnosis not present

## 2021-03-26 DIAGNOSIS — Z992 Dependence on renal dialysis: Secondary | ICD-10-CM | POA: Diagnosis not present

## 2021-03-26 DIAGNOSIS — N186 End stage renal disease: Secondary | ICD-10-CM | POA: Diagnosis not present

## 2021-03-28 DIAGNOSIS — Z992 Dependence on renal dialysis: Secondary | ICD-10-CM | POA: Diagnosis not present

## 2021-03-28 DIAGNOSIS — N186 End stage renal disease: Secondary | ICD-10-CM | POA: Diagnosis not present

## 2021-03-29 DIAGNOSIS — N186 End stage renal disease: Secondary | ICD-10-CM | POA: Diagnosis not present

## 2021-03-29 DIAGNOSIS — Z992 Dependence on renal dialysis: Secondary | ICD-10-CM | POA: Diagnosis not present

## 2021-03-30 DIAGNOSIS — Z992 Dependence on renal dialysis: Secondary | ICD-10-CM | POA: Diagnosis not present

## 2021-03-30 DIAGNOSIS — N186 End stage renal disease: Secondary | ICD-10-CM | POA: Diagnosis not present

## 2021-03-31 DIAGNOSIS — Z992 Dependence on renal dialysis: Secondary | ICD-10-CM | POA: Diagnosis not present

## 2021-03-31 DIAGNOSIS — Z7901 Long term (current) use of anticoagulants: Secondary | ICD-10-CM | POA: Diagnosis not present

## 2021-03-31 DIAGNOSIS — N186 End stage renal disease: Secondary | ICD-10-CM | POA: Diagnosis not present

## 2021-04-02 DIAGNOSIS — Z992 Dependence on renal dialysis: Secondary | ICD-10-CM | POA: Diagnosis not present

## 2021-04-02 DIAGNOSIS — N186 End stage renal disease: Secondary | ICD-10-CM | POA: Diagnosis not present

## 2021-04-04 DIAGNOSIS — Z992 Dependence on renal dialysis: Secondary | ICD-10-CM | POA: Diagnosis not present

## 2021-04-04 DIAGNOSIS — N186 End stage renal disease: Secondary | ICD-10-CM | POA: Diagnosis not present

## 2021-04-07 DIAGNOSIS — N186 End stage renal disease: Secondary | ICD-10-CM | POA: Diagnosis not present

## 2021-04-07 DIAGNOSIS — Z7901 Long term (current) use of anticoagulants: Secondary | ICD-10-CM | POA: Diagnosis not present

## 2021-04-07 DIAGNOSIS — Z992 Dependence on renal dialysis: Secondary | ICD-10-CM | POA: Diagnosis not present

## 2021-04-09 DIAGNOSIS — Z992 Dependence on renal dialysis: Secondary | ICD-10-CM | POA: Diagnosis not present

## 2021-04-09 DIAGNOSIS — N186 End stage renal disease: Secondary | ICD-10-CM | POA: Diagnosis not present

## 2021-04-11 DIAGNOSIS — Z992 Dependence on renal dialysis: Secondary | ICD-10-CM | POA: Diagnosis not present

## 2021-04-11 DIAGNOSIS — N186 End stage renal disease: Secondary | ICD-10-CM | POA: Diagnosis not present

## 2021-04-14 DIAGNOSIS — N186 End stage renal disease: Secondary | ICD-10-CM | POA: Diagnosis not present

## 2021-04-14 DIAGNOSIS — Z992 Dependence on renal dialysis: Secondary | ICD-10-CM | POA: Diagnosis not present

## 2021-04-16 DIAGNOSIS — Z992 Dependence on renal dialysis: Secondary | ICD-10-CM | POA: Diagnosis not present

## 2021-04-16 DIAGNOSIS — N186 End stage renal disease: Secondary | ICD-10-CM | POA: Diagnosis not present

## 2021-04-16 DIAGNOSIS — Z7901 Long term (current) use of anticoagulants: Secondary | ICD-10-CM | POA: Diagnosis not present

## 2021-04-18 DIAGNOSIS — Z992 Dependence on renal dialysis: Secondary | ICD-10-CM | POA: Diagnosis not present

## 2021-04-18 DIAGNOSIS — N186 End stage renal disease: Secondary | ICD-10-CM | POA: Diagnosis not present

## 2021-04-19 DIAGNOSIS — Z992 Dependence on renal dialysis: Secondary | ICD-10-CM | POA: Diagnosis not present

## 2021-04-19 DIAGNOSIS — N186 End stage renal disease: Secondary | ICD-10-CM | POA: Diagnosis not present

## 2021-04-21 DIAGNOSIS — N186 End stage renal disease: Secondary | ICD-10-CM | POA: Diagnosis not present

## 2021-04-21 DIAGNOSIS — Z992 Dependence on renal dialysis: Secondary | ICD-10-CM | POA: Diagnosis not present

## 2021-04-21 DIAGNOSIS — Z7901 Long term (current) use of anticoagulants: Secondary | ICD-10-CM | POA: Diagnosis not present

## 2021-04-23 DIAGNOSIS — N186 End stage renal disease: Secondary | ICD-10-CM | POA: Diagnosis not present

## 2021-04-23 DIAGNOSIS — Z992 Dependence on renal dialysis: Secondary | ICD-10-CM | POA: Diagnosis not present

## 2021-04-25 DIAGNOSIS — N186 End stage renal disease: Secondary | ICD-10-CM | POA: Diagnosis not present

## 2021-04-25 DIAGNOSIS — Z992 Dependence on renal dialysis: Secondary | ICD-10-CM | POA: Diagnosis not present

## 2021-04-28 DIAGNOSIS — N186 End stage renal disease: Secondary | ICD-10-CM | POA: Diagnosis not present

## 2021-04-28 DIAGNOSIS — Z7901 Long term (current) use of anticoagulants: Secondary | ICD-10-CM | POA: Diagnosis not present

## 2021-04-28 DIAGNOSIS — Z992 Dependence on renal dialysis: Secondary | ICD-10-CM | POA: Diagnosis not present

## 2021-04-29 DIAGNOSIS — E113592 Type 2 diabetes mellitus with proliferative diabetic retinopathy without macular edema, left eye: Secondary | ICD-10-CM | POA: Diagnosis not present

## 2021-04-29 DIAGNOSIS — H4312 Vitreous hemorrhage, left eye: Secondary | ICD-10-CM | POA: Diagnosis not present

## 2021-04-29 DIAGNOSIS — Z992 Dependence on renal dialysis: Secondary | ICD-10-CM | POA: Diagnosis not present

## 2021-04-29 DIAGNOSIS — N186 End stage renal disease: Secondary | ICD-10-CM | POA: Diagnosis not present

## 2021-04-30 DIAGNOSIS — Z992 Dependence on renal dialysis: Secondary | ICD-10-CM | POA: Diagnosis not present

## 2021-04-30 DIAGNOSIS — N186 End stage renal disease: Secondary | ICD-10-CM | POA: Diagnosis not present

## 2021-05-02 DIAGNOSIS — Z992 Dependence on renal dialysis: Secondary | ICD-10-CM | POA: Diagnosis not present

## 2021-05-02 DIAGNOSIS — N186 End stage renal disease: Secondary | ICD-10-CM | POA: Diagnosis not present

## 2021-05-05 DIAGNOSIS — Z992 Dependence on renal dialysis: Secondary | ICD-10-CM | POA: Diagnosis not present

## 2021-05-05 DIAGNOSIS — N186 End stage renal disease: Secondary | ICD-10-CM | POA: Diagnosis not present

## 2021-05-07 DIAGNOSIS — Z992 Dependence on renal dialysis: Secondary | ICD-10-CM | POA: Diagnosis not present

## 2021-05-07 DIAGNOSIS — N186 End stage renal disease: Secondary | ICD-10-CM | POA: Diagnosis not present

## 2021-05-09 DIAGNOSIS — N186 End stage renal disease: Secondary | ICD-10-CM | POA: Diagnosis not present

## 2021-05-09 DIAGNOSIS — Z992 Dependence on renal dialysis: Secondary | ICD-10-CM | POA: Diagnosis not present

## 2021-05-12 DIAGNOSIS — N186 End stage renal disease: Secondary | ICD-10-CM | POA: Diagnosis not present

## 2021-05-12 DIAGNOSIS — Z7901 Long term (current) use of anticoagulants: Secondary | ICD-10-CM | POA: Diagnosis not present

## 2021-05-12 DIAGNOSIS — Z992 Dependence on renal dialysis: Secondary | ICD-10-CM | POA: Diagnosis not present

## 2021-05-14 DIAGNOSIS — Z992 Dependence on renal dialysis: Secondary | ICD-10-CM | POA: Diagnosis not present

## 2021-05-14 DIAGNOSIS — N186 End stage renal disease: Secondary | ICD-10-CM | POA: Diagnosis not present

## 2021-05-16 DIAGNOSIS — Z992 Dependence on renal dialysis: Secondary | ICD-10-CM | POA: Diagnosis not present

## 2021-05-16 DIAGNOSIS — N186 End stage renal disease: Secondary | ICD-10-CM | POA: Diagnosis not present

## 2021-05-19 DIAGNOSIS — N186 End stage renal disease: Secondary | ICD-10-CM | POA: Diagnosis not present

## 2021-05-19 DIAGNOSIS — Z992 Dependence on renal dialysis: Secondary | ICD-10-CM | POA: Diagnosis not present

## 2021-05-19 DIAGNOSIS — Z7901 Long term (current) use of anticoagulants: Secondary | ICD-10-CM | POA: Diagnosis not present

## 2021-05-21 DIAGNOSIS — Z992 Dependence on renal dialysis: Secondary | ICD-10-CM | POA: Diagnosis not present

## 2021-05-21 DIAGNOSIS — N186 End stage renal disease: Secondary | ICD-10-CM | POA: Diagnosis not present

## 2021-05-23 DIAGNOSIS — N186 End stage renal disease: Secondary | ICD-10-CM | POA: Diagnosis not present

## 2021-05-23 DIAGNOSIS — Z992 Dependence on renal dialysis: Secondary | ICD-10-CM | POA: Diagnosis not present

## 2021-05-26 DIAGNOSIS — Z992 Dependence on renal dialysis: Secondary | ICD-10-CM | POA: Diagnosis not present

## 2021-05-26 DIAGNOSIS — N186 End stage renal disease: Secondary | ICD-10-CM | POA: Diagnosis not present

## 2021-05-27 DIAGNOSIS — E113592 Type 2 diabetes mellitus with proliferative diabetic retinopathy without macular edema, left eye: Secondary | ICD-10-CM | POA: Diagnosis not present

## 2021-05-28 DIAGNOSIS — N186 End stage renal disease: Secondary | ICD-10-CM | POA: Diagnosis not present

## 2021-05-28 DIAGNOSIS — Z992 Dependence on renal dialysis: Secondary | ICD-10-CM | POA: Diagnosis not present

## 2021-05-29 DIAGNOSIS — N186 End stage renal disease: Secondary | ICD-10-CM | POA: Diagnosis not present

## 2021-05-29 DIAGNOSIS — Z992 Dependence on renal dialysis: Secondary | ICD-10-CM | POA: Diagnosis not present

## 2021-05-30 DIAGNOSIS — Z992 Dependence on renal dialysis: Secondary | ICD-10-CM | POA: Diagnosis not present

## 2021-05-30 DIAGNOSIS — N186 End stage renal disease: Secondary | ICD-10-CM | POA: Diagnosis not present

## 2021-06-02 DIAGNOSIS — Z992 Dependence on renal dialysis: Secondary | ICD-10-CM | POA: Diagnosis not present

## 2021-06-02 DIAGNOSIS — Z7901 Long term (current) use of anticoagulants: Secondary | ICD-10-CM | POA: Diagnosis not present

## 2021-06-02 DIAGNOSIS — N186 End stage renal disease: Secondary | ICD-10-CM | POA: Diagnosis not present

## 2021-06-04 DIAGNOSIS — N186 End stage renal disease: Secondary | ICD-10-CM | POA: Diagnosis not present

## 2021-06-04 DIAGNOSIS — Z992 Dependence on renal dialysis: Secondary | ICD-10-CM | POA: Diagnosis not present

## 2021-06-05 ENCOUNTER — Other Ambulatory Visit: Payer: Self-pay | Admitting: Urology

## 2021-06-06 DIAGNOSIS — N186 End stage renal disease: Secondary | ICD-10-CM | POA: Diagnosis not present

## 2021-06-06 DIAGNOSIS — Z992 Dependence on renal dialysis: Secondary | ICD-10-CM | POA: Diagnosis not present

## 2021-06-08 DIAGNOSIS — I871 Compression of vein: Secondary | ICD-10-CM | POA: Diagnosis not present

## 2021-06-08 DIAGNOSIS — T82858A Stenosis of vascular prosthetic devices, implants and grafts, initial encounter: Secondary | ICD-10-CM | POA: Diagnosis not present

## 2021-06-08 DIAGNOSIS — G4730 Sleep apnea, unspecified: Secondary | ICD-10-CM | POA: Diagnosis not present

## 2021-06-08 DIAGNOSIS — Z992 Dependence on renal dialysis: Secondary | ICD-10-CM | POA: Diagnosis not present

## 2021-06-08 DIAGNOSIS — E1122 Type 2 diabetes mellitus with diabetic chronic kidney disease: Secondary | ICD-10-CM | POA: Diagnosis not present

## 2021-06-08 DIAGNOSIS — I120 Hypertensive chronic kidney disease with stage 5 chronic kidney disease or end stage renal disease: Secondary | ICD-10-CM | POA: Diagnosis not present

## 2021-06-08 DIAGNOSIS — M1030 Gout due to renal impairment, unspecified site: Secondary | ICD-10-CM | POA: Diagnosis not present

## 2021-06-08 DIAGNOSIS — T82898A Other specified complication of vascular prosthetic devices, implants and grafts, initial encounter: Secondary | ICD-10-CM | POA: Diagnosis not present

## 2021-06-08 DIAGNOSIS — N186 End stage renal disease: Secondary | ICD-10-CM | POA: Diagnosis not present

## 2021-06-09 DIAGNOSIS — Z992 Dependence on renal dialysis: Secondary | ICD-10-CM | POA: Diagnosis not present

## 2021-06-09 DIAGNOSIS — N186 End stage renal disease: Secondary | ICD-10-CM | POA: Diagnosis not present

## 2021-06-11 DIAGNOSIS — Z992 Dependence on renal dialysis: Secondary | ICD-10-CM | POA: Diagnosis not present

## 2021-06-11 DIAGNOSIS — N186 End stage renal disease: Secondary | ICD-10-CM | POA: Diagnosis not present

## 2021-06-12 ENCOUNTER — Other Ambulatory Visit: Payer: Self-pay | Admitting: Endocrinology

## 2021-06-13 DIAGNOSIS — N186 End stage renal disease: Secondary | ICD-10-CM | POA: Diagnosis not present

## 2021-06-13 DIAGNOSIS — Z992 Dependence on renal dialysis: Secondary | ICD-10-CM | POA: Diagnosis not present

## 2021-06-16 DIAGNOSIS — Z992 Dependence on renal dialysis: Secondary | ICD-10-CM | POA: Diagnosis not present

## 2021-06-16 DIAGNOSIS — N186 End stage renal disease: Secondary | ICD-10-CM | POA: Diagnosis not present

## 2021-06-18 DIAGNOSIS — N186 End stage renal disease: Secondary | ICD-10-CM | POA: Diagnosis not present

## 2021-06-18 DIAGNOSIS — Z992 Dependence on renal dialysis: Secondary | ICD-10-CM | POA: Diagnosis not present

## 2021-06-20 DIAGNOSIS — N186 End stage renal disease: Secondary | ICD-10-CM | POA: Diagnosis not present

## 2021-06-20 DIAGNOSIS — Z992 Dependence on renal dialysis: Secondary | ICD-10-CM | POA: Diagnosis not present

## 2021-06-21 NOTE — Progress Notes (Signed)
MRN : [id number]  [name] is a 50 y.o. ([birth year]) male who presents with chief complaint of check access.  History of Present Illness:   The patient returns to the office for followup of their dialysis access.  He reports recently the function of the access has been stable.  However, since his last visit he did end up having a angiogram with stent placement.  The patient denies increased bleeding time or increased recirculation. Patient denies difficulty with cannulation. The patient denies hand pain or other symptoms consistent with steal phenomena.  No significant arm swelling.   The patient denies redness or swelling at the access site. The patient denies fever or chills at home or while on dialysis.   He is also followed for lymphedema.  He notes his swelling has been very well controlled using a brand of compression socks that he has found called Hoyisox.  He has also been using his lymph pump on a regular basis.  There have been no interval episodes of infection or cellulitis.  The patient denies recent episodes of angina or shortness of breath.    Duplex ultrasound of the AV access shows a patent access.  His volume access is 1105 cc/min.  The previously noted stenosis is improved since the intervention and compared to last study.    No outpatient medications have been marked as taking for the 06/22/21 encounter (Appointment) with Delana Meyer, Dolores Lory, MD.   Current Facility-Administered Medications for the 06/22/21 encounter (Appointment) with Delana Meyer, Dolores Lory, MD  Medication   0.9 %  sodium chloride infusion    Past Medical History:  Diagnosis Date   Anemia    Chronic kidney disease    14% FUNCTION   Diabetes mellitus without complication ([place])    Diabetic retinopathy ([place])    Dyspnea    DOE   Dysrhythmia    End stage renal disease ([place])    History of orthopnea    error wrong patietn   Hypercholesteremia    error   Hyperkalemia    error wrong patient    Hypertension    Long-term insulin use ([place])    Lymphedema of leg    Metabolic encephalopathy    error   Microalbuminuria    MSSA (methicillin susceptible Staphylococcus aureus)    Obesity    PVD (peripheral vascular disease) ([place])    Respiratory failure (HCC)    Error   Sepsis ([place])    error   Sleep apnea    no CPAP   UTI (urinary tract infection)    error   Venous ulcer ([place])    Vitamin D deficiency     Past Surgical History:  Procedure Laterality Date   A/V FISTULAGRAM Right 10/24/2018   Procedure: A/V FISTULAGRAM;  Surgeon: Katha Cabal, MD;  Location: [place] [facility];  Service: Cardiovascular;  Laterality: Right;   A/V FISTULAGRAM Right 11/24/2018   Procedure: A/V FISTULAGRAM;  Surgeon: Katha Cabal, MD;  Location: [place] [facility];  Service: Cardiovascular;  Laterality: Right;   A/V FISTULAGRAM Left 07/31/2019   Procedure: A/V FISTULAGRAM;  Surgeon: Katha Cabal, MD;  Location: [place] [facility];  Service: Cardiovascular;  Laterality: Left;   A/V FISTULAGRAM Left 12/11/2019   Procedure: A/V FISTULAGRAM;  Surgeon: Katha Cabal, MD;  Location: [place] [facility];  Service: Cardiovascular;  Laterality: Left;   A/V SHUNT INTERVENTION Right 02/21/2019   Procedure: A/V SHUNT INTERVENTION;  Surgeon: Katha Cabal, MD;  Location: [facility];  Service: Cardiovascular;  Laterality: Right;   APPLICATION OF WOUND VAC Left 10/03/2017   Procedure: APPLICATION OF WOUND VAC;  Surgeon: Algernon Huxley, MD;  Location: [facility];  Service: General;  Laterality: Left;   APPLICATION OF WOUND VAC Left 10/11/2017   Procedure: APPLICATION OF WOUND VAC;  Surgeon: Katha Cabal, MD;  Location: [facility];  Service: Vascular;  Laterality: Left;   APPLICATION OF WOUND VAC Left 10/14/2017   Procedure: WOUND VAC CHANGE;  Surgeon: Katha Cabal, MD;  Location: [facility];  Service: Vascular;  Laterality: Left;  left lower leg   APPLICATION OF WOUND VAC Left  10/18/2017   Procedure: WOUND VAC CHANGE;  Surgeon: Katha Cabal, MD;  Location: [facility];  Service: Vascular;  Laterality: Left;   APPLICATION OF WOUND VAC Left 10/07/2017   Procedure: APPLICATION OF WOUND VAC;  Surgeon: Katha Cabal, MD;  Location: [facility];  Service: Vascular;  Laterality: Left;   AV FISTULA INSERTION W/ RF MAGNETIC GUIDANCE Right 07/19/2018   Procedure: AV FISTULA INSERTION W/RF MAGNETIC GUIDANCE;  Surgeon: Katha Cabal, MD;  Location: [facility] [facility];  Service: Cardiovascular;  Laterality: Right;   AV FISTULA PLACEMENT Left 05/11/2019   Procedure: ARTERIOVENOUS (AV) FISTULA CREATION (RADIOCEPHALIC );  Surgeon: Katha Cabal, MD;  Location: [facility];  Service: Vascular;  Laterality: Left;   AV FISTULA PLACEMENT Left 02/20/2020   Procedure: INSERTION OF ARTERIOVENOUS (AV) GORE-TEX GRAFT LEFT ARM ( FOREARM LOOP );  Surgeon: Katha Cabal, MD;  Location: [facility];  Service: Vascular;  Laterality: Left;   CATARACT EXTRACTION W/PHACO Left 11/01/2018   Procedure: CATARACT EXTRACTION PHACO AND INTRAOCULAR LENS PLACEMENT (IOC)-LEFT, DIABETIC-INSULIN DEPENDENT;  Surgeon: Eulogio Bear, MD;  Location: [facility];  Service: Ophthalmology;  Laterality: Left;  [place] 00:41.8 CDE 4.61 Fluid Pack Lot # [id number]   CATARACT EXTRACTION W/PHACO Right 04/27/2019   Procedure: CATARACT EXTRACTION PHACO AND INTRAOCULAR LENS PLACEMENT (IOC);  Surgeon: Eulogio Bear, MD;  Location: [facility];  Service: Ophthalmology;  Laterality: Right;  [place] 00:34 CDE 1.97 FLUID PACK LOT # [id number]    COLONOSCOPY WITH PROPOFOL N/A 05/12/2020   Procedure: COLONOSCOPY WITH PROPOFOL;  Surgeon: Toledo, Benay Pike, MD;  Location: [facility];  Service: Gastroenterology;  Laterality: N/A;   DIALYSIS/PERMA CATHETER INSERTION N/A 02/23/2019   Procedure: DIALYSIS/PERMA CATHETER INSERTION;  Surgeon: Algernon Huxley, MD;  Location: [place] [facility];  Service: Cardiovascular;  Laterality: N/A;    DIALYSIS/PERMA CATHETER REMOVAL N/A 04/03/2020   Procedure: DIALYSIS/PERMA CATHETER REMOVAL;  Surgeon: Katha Cabal, MD;  Location: [place] [facility];  Service: Cardiovascular;  Laterality: N/A;   I & D EXTREMITY Left 10/11/2017   Procedure: IRRIGATION AND DEBRIDEMENT EXTREMITY;  Surgeon: Katha Cabal, MD;  Location: [facility];  Service: Vascular;  Laterality: Left;   INCISION AND DRAINAGE ABSCESS Right 10/07/2017   Procedure: INCISION AND DRAINAGE ABSCESS;  Surgeon: Katha Cabal, MD;  Location: [facility];  Service: Vascular;  Laterality: Right;   IRRIGATION AND DEBRIDEMENT ABSCESS Left 10/03/2017   Procedure: IRRIGATION AND DEBRIDEMENT ABSCESS with debridement of skin, soft tissue, muscle 50sq cm;  Surgeon: Algernon Huxley, MD;  Location: [facility];  Service: General;  Laterality: Left;   TEMPORARY DIALYSIS CATHETER  02/20/2019   Procedure: TEMPORARY DIALYSIS CATHETER;  Surgeon: Katha Cabal, MD;  Location: [place] [facility];  Service: Cardiovascular;;   UPPER EXTREMITY ANGIOGRAPHY Left 09/18/2019   Procedure:  UPPER EXTREMITY ANGIOGRAPHY;  Surgeon: Katha Cabal, MD;  Location: [place] [facility];  Service: Cardiovascular;  Laterality: Left;    Social History Social History   Tobacco Use   Smoking status: Former    Types: Cigars   Smokeless tobacco: Former    Quit date: 09/30/2017   Tobacco comments:    occasional  Vaping Use   Vaping Use: Never used  Substance Use Topics   Alcohol use: Yes    Alcohol/week: 1.0 standard drink    Types: 1 Cans of beer per week    Comment: beer   Drug use: Not Currently    Family History Family History  Problem Relation Age of Onset   Diabetes Father    Kidney disease Father    Diabetes Daughter     Allergies  Allergen Reactions   Lisinopril Swelling    Lips mouth    Furosemide Cough    Light headed, blurry vision, weakness.   Adhesive [Tape] Itching     REVIEW OF SYSTEMS (Negative unless  checked)  Constitutional: [] Weight loss  [] Fever  [] Chills Cardiac: [] Chest pain   [] Chest pressure   [] Palpitations   [] Shortness of breath when laying flat   [] Shortness of breath with exertion. Vascular:  [] Pain in legs with walking   [] Pain in legs at rest  [] History of DVT   [] Phlebitis   [x] Swelling in legs   [] Varicose veins   [] Non-healing ulcers Pulmonary:   [] Uses home oxygen   [] Productive cough   [] Hemoptysis   [] Wheeze  [] COPD   [] Asthma Neurologic:  [] Dizziness   [] Seizures   [] History of stroke   [] History of TIA  [] Aphasia   [] Vissual changes   [] Weakness or numbness in arm   [] Weakness or numbness in leg Musculoskeletal:   [] Joint swelling   [] Joint pain   [] Low back pain Hematologic:  [] Easy bruising  [] Easy bleeding   [] Hypercoagulable state   [] Anemic Gastrointestinal:  [] Diarrhea   [] Vomiting  [] Gastroesophageal reflux/heartburn   [] Difficulty swallowing. Genitourinary:  [x] Chronic kidney disease   [] Difficult urination  [] Frequent urination   [] Blood in urine Skin:  [] Rashes   [] Ulcers  Psychological:  [] History of anxiety   []  History of major depression.  Physical Examination  There were no vitals filed for this visit. There is no height or weight on file to calculate BMI. Gen: WD/WN, NAD Head: [place]/AT, No temporalis wasting.  Ear/Nose/Throat: Hearing grossly intact, nares w/o erythema or drainage Eyes: PER, EOMI, sclera nonicteric.  Neck: Supple, no gross masses or lesions.  No JVD.  Pulmonary:  Good air movement, no audible wheezing, no use of accessory muscles.  Cardiac: RRR, precordium non-hyperdynamic. Vascular:   left forearm access good thrill and bruit;  scattered varicosities present bilaterally.  Moderate venous stasis changes to the legs bilaterally.  3+ firm pitting edema left >> right Vessel Right Left  Radial  Palpable  Brachial  Palpable  Gastrointestinal: soft, non-distended. No guarding/no peritoneal signs.  Musculoskeletal: M/S 5/5 throughout.  No  deformity.  Neurologic: CN 2-12 intact. Pain and light touch intact in extremities.  Symmetrical.  Speech is fluent. Motor exam as listed above. Psychiatric: Judgment intact, Mood & affect appropriate for pt's clinical situation. Dermatologic: No rashes or ulcers noted.  No changes consistent with cellulitis.   CBC Lab Results  Component Value Date   WBC 6.0 02/19/2020   HGB 13.1 09/29/2020   HCT 41.8 09/29/2020   MCV 95.0 02/19/2020   PLT 190 02/19/2020  BMET    Component Value Date/Time   NA 140 02/20/2020 0650   K 5.1 05/12/2020 0842   CL 98 02/20/2020 0650   CO2 28 02/19/2020 1316   GLUCOSE 209 (H) 02/20/2020 0650   BUN 37 (H) 02/20/2020 0650   CREATININE 12.00 (H) 02/20/2020 0650   CALCIUM 8.5 (L) 02/19/2020 1316   GFRNONAA 8 (L) 02/19/2020 1316   GFRAA 9 (L) 02/19/2020 1316   CrCl cannot be calculated (Patient's most recent lab result is older than the maximum 21 days allowed.).  COAG Lab Results  Component Value Date   INR 1.4 (H) 03/21/2020   INR 1.2 02/20/2020   INR 1.4 (H) 02/19/2020    Radiology No results found.   Assessment/Plan 1. End stage renal disease ([place]) Recommend:  The patient is doing well and currently has adequate dialysis access. The patient's dialysis center is not reporting any access issues. Flow pattern is stable when compared to the prior ultrasound.  The patient should have a duplex ultrasound of the dialysis access in 6 months. The patient will follow-up with me in the office after each ultrasound    - VAS [place] (AVF, AVG); Future  2. Lymphedema  No surgery or intervention at this point in time.    I have reviewed my discussion with the patient regarding lymphedema and why it  causes symptoms.  Patient will continue wearing graduated compression stockings class 1 (20-30 mmHg) on a daily basis, as noted above he has found a brand name Hoyisox which is available online and seems to be very reasonably  priced.   In addition, behavioral modification throughout the day will be continued.  This will include frequent elevation (such as in a recliner), use of over the counter pain medications as needed and exercise such as walking.  The patient will continue aggressive use of the  lymph pump.  This will continue to improve the edema control and prevent sequela such as ulcers and infections.   3. Chronic venous insufficiency See #2  4. Primary hypertension Continue antihypertensive medications as already ordered, these medications have been reviewed and there are no changes at this time.   5. Type 1 diabetes mellitus with other circulatory complication (HCC) Continue hypoglycemic medications as already ordered, these medications have been reviewed and there are no changes at this time.  Hgb A1C to be monitored as already arranged by primary service    [name], MD  06/21/2021 11:26 AM

## 2021-06-22 ENCOUNTER — Encounter (INDEPENDENT_AMBULATORY_CARE_PROVIDER_SITE_OTHER): Payer: Self-pay | Admitting: Vascular Surgery

## 2021-06-22 ENCOUNTER — Ambulatory Visit (INDEPENDENT_AMBULATORY_CARE_PROVIDER_SITE_OTHER): Payer: Medicare HMO | Admitting: Vascular Surgery

## 2021-06-22 ENCOUNTER — Other Ambulatory Visit: Payer: Self-pay

## 2021-06-22 ENCOUNTER — Ambulatory Visit (INDEPENDENT_AMBULATORY_CARE_PROVIDER_SITE_OTHER): Payer: Medicare HMO

## 2021-06-22 VITALS — BP 136/74 | HR 52 | Ht 76.0 in | Wt >= 6400 oz

## 2021-06-22 DIAGNOSIS — I890 Lymphedema, not elsewhere classified: Secondary | ICD-10-CM | POA: Diagnosis not present

## 2021-06-22 DIAGNOSIS — I872 Venous insufficiency (chronic) (peripheral): Secondary | ICD-10-CM | POA: Diagnosis not present

## 2021-06-22 DIAGNOSIS — E1059 Type 1 diabetes mellitus with other circulatory complications: Secondary | ICD-10-CM

## 2021-06-22 DIAGNOSIS — I10 Essential (primary) hypertension: Secondary | ICD-10-CM | POA: Diagnosis not present

## 2021-06-22 DIAGNOSIS — T829XXS Unspecified complication of cardiac and vascular prosthetic device, implant and graft, sequela: Secondary | ICD-10-CM

## 2021-06-22 DIAGNOSIS — N186 End stage renal disease: Secondary | ICD-10-CM | POA: Diagnosis not present

## 2021-06-23 DIAGNOSIS — E119 Type 2 diabetes mellitus without complications: Secondary | ICD-10-CM | POA: Diagnosis not present

## 2021-06-23 DIAGNOSIS — Z992 Dependence on renal dialysis: Secondary | ICD-10-CM | POA: Diagnosis not present

## 2021-06-23 DIAGNOSIS — Z7901 Long term (current) use of anticoagulants: Secondary | ICD-10-CM | POA: Diagnosis not present

## 2021-06-23 DIAGNOSIS — N186 End stage renal disease: Secondary | ICD-10-CM | POA: Diagnosis not present

## 2021-06-23 DIAGNOSIS — Z794 Long term (current) use of insulin: Secondary | ICD-10-CM | POA: Diagnosis not present

## 2021-06-25 DIAGNOSIS — N186 End stage renal disease: Secondary | ICD-10-CM | POA: Diagnosis not present

## 2021-06-25 DIAGNOSIS — Z992 Dependence on renal dialysis: Secondary | ICD-10-CM | POA: Diagnosis not present

## 2021-06-26 DIAGNOSIS — N186 End stage renal disease: Secondary | ICD-10-CM | POA: Diagnosis not present

## 2021-06-26 DIAGNOSIS — Z992 Dependence on renal dialysis: Secondary | ICD-10-CM | POA: Diagnosis not present

## 2021-06-29 DIAGNOSIS — Z992 Dependence on renal dialysis: Secondary | ICD-10-CM | POA: Diagnosis not present

## 2021-06-29 DIAGNOSIS — N186 End stage renal disease: Secondary | ICD-10-CM | POA: Diagnosis not present

## 2021-06-30 DIAGNOSIS — Z23 Encounter for immunization: Secondary | ICD-10-CM | POA: Diagnosis not present

## 2021-06-30 DIAGNOSIS — Z992 Dependence on renal dialysis: Secondary | ICD-10-CM | POA: Diagnosis not present

## 2021-06-30 DIAGNOSIS — N186 End stage renal disease: Secondary | ICD-10-CM | POA: Diagnosis not present

## 2021-07-02 DIAGNOSIS — N186 End stage renal disease: Secondary | ICD-10-CM | POA: Diagnosis not present

## 2021-07-02 DIAGNOSIS — Z992 Dependence on renal dialysis: Secondary | ICD-10-CM | POA: Diagnosis not present

## 2021-07-02 DIAGNOSIS — Z23 Encounter for immunization: Secondary | ICD-10-CM | POA: Diagnosis not present

## 2021-07-04 DIAGNOSIS — Z992 Dependence on renal dialysis: Secondary | ICD-10-CM | POA: Diagnosis not present

## 2021-07-04 DIAGNOSIS — N186 End stage renal disease: Secondary | ICD-10-CM | POA: Diagnosis not present

## 2021-07-07 DIAGNOSIS — Z7901 Long term (current) use of anticoagulants: Secondary | ICD-10-CM | POA: Diagnosis not present

## 2021-07-07 DIAGNOSIS — N186 End stage renal disease: Secondary | ICD-10-CM | POA: Diagnosis not present

## 2021-07-07 DIAGNOSIS — Z992 Dependence on renal dialysis: Secondary | ICD-10-CM | POA: Diagnosis not present

## 2021-07-07 DIAGNOSIS — Z23 Encounter for immunization: Secondary | ICD-10-CM | POA: Diagnosis not present

## 2021-07-08 DIAGNOSIS — E113592 Type 2 diabetes mellitus with proliferative diabetic retinopathy without macular edema, left eye: Secondary | ICD-10-CM | POA: Diagnosis not present

## 2021-07-09 DIAGNOSIS — Z992 Dependence on renal dialysis: Secondary | ICD-10-CM | POA: Diagnosis not present

## 2021-07-09 DIAGNOSIS — Z23 Encounter for immunization: Secondary | ICD-10-CM | POA: Diagnosis not present

## 2021-07-09 DIAGNOSIS — N186 End stage renal disease: Secondary | ICD-10-CM | POA: Diagnosis not present

## 2021-07-09 DIAGNOSIS — Z7901 Long term (current) use of anticoagulants: Secondary | ICD-10-CM | POA: Diagnosis not present

## 2021-07-11 DIAGNOSIS — Z992 Dependence on renal dialysis: Secondary | ICD-10-CM | POA: Diagnosis not present

## 2021-07-11 DIAGNOSIS — N186 End stage renal disease: Secondary | ICD-10-CM | POA: Diagnosis not present

## 2021-07-11 DIAGNOSIS — Z23 Encounter for immunization: Secondary | ICD-10-CM | POA: Diagnosis not present

## 2021-07-14 DIAGNOSIS — N186 End stage renal disease: Secondary | ICD-10-CM | POA: Diagnosis not present

## 2021-07-14 DIAGNOSIS — Z992 Dependence on renal dialysis: Secondary | ICD-10-CM | POA: Diagnosis not present

## 2021-07-14 DIAGNOSIS — Z23 Encounter for immunization: Secondary | ICD-10-CM | POA: Diagnosis not present

## 2021-07-16 DIAGNOSIS — Z23 Encounter for immunization: Secondary | ICD-10-CM | POA: Diagnosis not present

## 2021-07-16 DIAGNOSIS — Z992 Dependence on renal dialysis: Secondary | ICD-10-CM | POA: Diagnosis not present

## 2021-07-16 DIAGNOSIS — N186 End stage renal disease: Secondary | ICD-10-CM | POA: Diagnosis not present

## 2021-07-18 DIAGNOSIS — Z992 Dependence on renal dialysis: Secondary | ICD-10-CM | POA: Diagnosis not present

## 2021-07-18 DIAGNOSIS — Z23 Encounter for immunization: Secondary | ICD-10-CM | POA: Diagnosis not present

## 2021-07-18 DIAGNOSIS — N186 End stage renal disease: Secondary | ICD-10-CM | POA: Diagnosis not present

## 2021-07-21 DIAGNOSIS — Z992 Dependence on renal dialysis: Secondary | ICD-10-CM | POA: Diagnosis not present

## 2021-07-21 DIAGNOSIS — Z23 Encounter for immunization: Secondary | ICD-10-CM | POA: Diagnosis not present

## 2021-07-21 DIAGNOSIS — N186 End stage renal disease: Secondary | ICD-10-CM | POA: Diagnosis not present

## 2021-07-22 DIAGNOSIS — Z23 Encounter for immunization: Secondary | ICD-10-CM | POA: Diagnosis not present

## 2021-07-22 DIAGNOSIS — Z992 Dependence on renal dialysis: Secondary | ICD-10-CM | POA: Diagnosis not present

## 2021-07-22 DIAGNOSIS — N186 End stage renal disease: Secondary | ICD-10-CM | POA: Diagnosis not present

## 2021-07-25 DIAGNOSIS — Z23 Encounter for immunization: Secondary | ICD-10-CM | POA: Diagnosis not present

## 2021-07-25 DIAGNOSIS — N186 End stage renal disease: Secondary | ICD-10-CM | POA: Diagnosis not present

## 2021-07-25 DIAGNOSIS — Z992 Dependence on renal dialysis: Secondary | ICD-10-CM | POA: Diagnosis not present

## 2021-07-28 DIAGNOSIS — Z7901 Long term (current) use of anticoagulants: Secondary | ICD-10-CM | POA: Diagnosis not present

## 2021-07-28 DIAGNOSIS — Z23 Encounter for immunization: Secondary | ICD-10-CM | POA: Diagnosis not present

## 2021-07-28 DIAGNOSIS — N186 End stage renal disease: Secondary | ICD-10-CM | POA: Diagnosis not present

## 2021-07-28 DIAGNOSIS — Z992 Dependence on renal dialysis: Secondary | ICD-10-CM | POA: Diagnosis not present

## 2021-07-29 DIAGNOSIS — Z992 Dependence on renal dialysis: Secondary | ICD-10-CM | POA: Diagnosis not present

## 2021-07-29 DIAGNOSIS — N186 End stage renal disease: Secondary | ICD-10-CM | POA: Diagnosis not present

## 2021-07-30 DIAGNOSIS — N186 End stage renal disease: Secondary | ICD-10-CM | POA: Diagnosis not present

## 2021-07-30 DIAGNOSIS — N2581 Secondary hyperparathyroidism of renal origin: Secondary | ICD-10-CM | POA: Diagnosis not present

## 2021-07-30 DIAGNOSIS — Z992 Dependence on renal dialysis: Secondary | ICD-10-CM | POA: Diagnosis not present

## 2021-07-31 DIAGNOSIS — N186 End stage renal disease: Secondary | ICD-10-CM | POA: Diagnosis not present

## 2021-07-31 DIAGNOSIS — N2581 Secondary hyperparathyroidism of renal origin: Secondary | ICD-10-CM | POA: Diagnosis not present

## 2021-07-31 DIAGNOSIS — Z992 Dependence on renal dialysis: Secondary | ICD-10-CM | POA: Diagnosis not present

## 2021-08-04 DIAGNOSIS — Z992 Dependence on renal dialysis: Secondary | ICD-10-CM | POA: Diagnosis not present

## 2021-08-04 DIAGNOSIS — N2581 Secondary hyperparathyroidism of renal origin: Secondary | ICD-10-CM | POA: Diagnosis not present

## 2021-08-04 DIAGNOSIS — N186 End stage renal disease: Secondary | ICD-10-CM | POA: Diagnosis not present

## 2021-08-05 ENCOUNTER — Ambulatory Visit (INDEPENDENT_AMBULATORY_CARE_PROVIDER_SITE_OTHER): Payer: Medicare HMO | Admitting: Endocrinology

## 2021-08-05 ENCOUNTER — Other Ambulatory Visit: Payer: Self-pay

## 2021-08-05 VITALS — BP 170/40 | HR 68 | Ht 76.0 in | Wt >= 6400 oz

## 2021-08-05 DIAGNOSIS — E221 Hyperprolactinemia: Secondary | ICD-10-CM

## 2021-08-05 DIAGNOSIS — Z125 Encounter for screening for malignant neoplasm of prostate: Secondary | ICD-10-CM | POA: Diagnosis not present

## 2021-08-05 DIAGNOSIS — R7989 Other specified abnormal findings of blood chemistry: Secondary | ICD-10-CM | POA: Diagnosis not present

## 2021-08-05 LAB — FOLLICLE STIMULATING HORMONE: FSH: 4 m[IU]/mL (ref 1.4–18.1)

## 2021-08-05 LAB — PROLACTIN: Prolactin: 10 ng/mL (ref 2.0–18.0)

## 2021-08-05 LAB — TSH: TSH: 2.32 u[IU]/mL (ref 0.35–5.50)

## 2021-08-05 LAB — T4, FREE: Free T4: 0.81 ng/dL (ref 0.60–1.60)

## 2021-08-05 LAB — PSA: PSA: 0.58 ng/mL (ref 0.10–4.00)

## 2021-08-05 LAB — LUTEINIZING HORMONE: LH: 8.81 m[IU]/mL (ref 1.50–9.30)

## 2021-08-05 NOTE — Progress Notes (Signed)
Subjective:    Patient ID: [name], male    DOB: 1971/06/18, 50 y.o.   MRN: [id number]  HPI Pt returns for f/u of hyperprolactinemia (dx'ed 2021; only cause found was ESRD; low testosterone was also noted then; he was rx'ed androgel by [facility]; he has 2 biological children; ON dex test was normal; f/u T was normal on parlodel and gel; MRI (2021) showed 6 mm adenoma).  He is on parlodel, and off testosterone.  He is on HD.   Past Medical History:  Diagnosis Date   Anemia    Chronic kidney disease    14% FUNCTION   Diabetes mellitus without complication ([place])    Diabetic retinopathy ([place])    Dyspnea    DOE   Dysrhythmia    End stage renal disease ([place])    History of orthopnea    error wrong patietn   Hypercholesteremia    error   Hyperkalemia    error wrong patient   Hypertension    Long-term insulin use ([place])    Lymphedema of leg    Metabolic encephalopathy    error   Microalbuminuria    MSSA (methicillin susceptible Staphylococcus aureus)    Obesity    PVD (peripheral vascular disease) (HCC)    Respiratory failure (HCC)    Error   Sepsis ([place])    error   Sleep apnea    no CPAP   UTI (urinary tract infection)    error   Venous ulcer ([place])    Vitamin D deficiency     Past Surgical History:  Procedure Laterality Date   A/V FISTULAGRAM Right 10/24/2018   Procedure: A/V FISTULAGRAM;  Surgeon: Katha Cabal, MD;  Location: [place] [facility];  Service: Cardiovascular;  Laterality: Right;   A/V FISTULAGRAM Right 11/24/2018   Procedure: A/V FISTULAGRAM;  Surgeon: Katha Cabal, MD;  Location: [place] [facility];  Service: Cardiovascular;  Laterality: Right;   A/V FISTULAGRAM Left 07/31/2019   Procedure: A/V FISTULAGRAM;  Surgeon: Katha Cabal, MD;  Location: [facility] [facility];  Service: Cardiovascular;  Laterality: Left;   A/V FISTULAGRAM Left 12/11/2019   Procedure: A/V FISTULAGRAM;  Surgeon: Katha Cabal, MD;  Location: [place] [facility];  Service: Cardiovascular;  Laterality: Left;   A/V SHUNT INTERVENTION Right 02/21/2019   Procedure: A/V SHUNT INTERVENTION;  Surgeon: Katha Cabal, MD;  Location: [facility];  Service: Cardiovascular;  Laterality: Right;   APPLICATION OF WOUND VAC Left 10/03/2017   Procedure: APPLICATION OF WOUND VAC;  Surgeon: Algernon Huxley, MD;  Location: [facility];  Service: General;  Laterality: Left;   APPLICATION OF WOUND VAC Left 10/11/2017   Procedure: APPLICATION OF WOUND VAC;  Surgeon: Katha Cabal, MD;  Location: [facility];  Service: Vascular;  Laterality: Left;   APPLICATION OF WOUND VAC Left 10/14/2017   Procedure: WOUND VAC CHANGE;  Surgeon: Katha Cabal, MD;  Location: [facility];  Service: Vascular;  Laterality: Left;  left lower leg   APPLICATION OF WOUND VAC Left 10/18/2017   Procedure: WOUND VAC CHANGE;  Surgeon: Katha Cabal, MD;  Location: [facility];  Service: Vascular;  Laterality: Left;   APPLICATION OF WOUND VAC Left 10/07/2017   Procedure: APPLICATION OF WOUND VAC;  Surgeon: Katha Cabal, MD;  Location: [facility];  Service: Vascular;  Laterality: Left;   AV FISTULA INSERTION W/ RF MAGNETIC GUIDANCE Right 07/19/2018   Procedure: AV FISTULA INSERTION W/RF MAGNETIC GUIDANCE;  Surgeon: Katha Cabal, MD;  Location: [place] [facility];  Service: Cardiovascular;  Laterality: Right;   AV FISTULA PLACEMENT Left 05/11/2019   Procedure: ARTERIOVENOUS (AV) FISTULA CREATION (RADIOCEPHALIC );  Surgeon: Katha Cabal, MD;  Location: [facility];  Service: Vascular;  Laterality: Left;   AV FISTULA PLACEMENT Left 02/20/2020   Procedure: INSERTION OF ARTERIOVENOUS (AV) GORE-TEX GRAFT LEFT ARM ( FOREARM LOOP );  Surgeon: Katha Cabal, MD;  Location: [facility];  Service: Vascular;  Laterality: Left;   CATARACT EXTRACTION W/PHACO Left 11/01/2018   Procedure: CATARACT EXTRACTION PHACO AND INTRAOCULAR LENS PLACEMENT (IOC)-LEFT, DIABETIC-INSULIN DEPENDENT;  Surgeon: Eulogio Bear, MD;  Location: [facility];  Service: Ophthalmology;  Laterality: Left;  [place] 00:41.8 CDE 4.61 Fluid Pack Lot # [id number]   CATARACT EXTRACTION W/PHACO Right 04/27/2019   Procedure: CATARACT EXTRACTION PHACO AND INTRAOCULAR LENS PLACEMENT (IOC);  Surgeon: Eulogio Bear, MD;  Location: [facility];  Service: Ophthalmology;  Laterality: Right;  [place] 00:34 CDE 1.97 FLUID PACK LOT # [id number] H    COLONOSCOPY WITH PROPOFOL N/A 05/12/2020   Procedure: COLONOSCOPY WITH PROPOFOL;  Surgeon: Toledo, Benay Pike, MD;  Location: [facility];  Service: Gastroenterology;  Laterality: N/A;   DIALYSIS/PERMA CATHETER INSERTION N/A 02/23/2019   Procedure: DIALYSIS/PERMA CATHETER INSERTION;  Surgeon: Algernon Huxley, MD;  Location: [place] [facility];  Service: Cardiovascular;  Laterality: N/A;   DIALYSIS/PERMA CATHETER REMOVAL N/A 04/03/2020   Procedure: DIALYSIS/PERMA CATHETER REMOVAL;  Surgeon: Katha Cabal, MD;  Location: [place] [facility];  Service: Cardiovascular;  Laterality: N/A;   I & D EXTREMITY Left 10/11/2017   Procedure: IRRIGATION AND DEBRIDEMENT EXTREMITY;  Surgeon: Katha Cabal, MD;  Location: [facility];  Service: Vascular;  Laterality: Left;   INCISION AND DRAINAGE ABSCESS Right 10/07/2017   Procedure: INCISION AND DRAINAGE ABSCESS;  Surgeon: Katha Cabal, MD;  Location: [facility];  Service: Vascular;  Laterality: Right;   IRRIGATION AND DEBRIDEMENT ABSCESS Left 10/03/2017   Procedure: IRRIGATION AND DEBRIDEMENT ABSCESS with debridement of skin, soft tissue, muscle 50sq cm;  Surgeon: Algernon Huxley, MD;  Location: [facility];  Service: General;  Laterality: Left;   TEMPORARY DIALYSIS CATHETER  02/20/2019   Procedure: TEMPORARY DIALYSIS CATHETER;  Surgeon: Katha Cabal, MD;  Location: [place] [facility];  Service: Cardiovascular;;   UPPER EXTREMITY ANGIOGRAPHY Left 09/18/2019   Procedure: UPPER EXTREMITY ANGIOGRAPHY;  Surgeon: Katha Cabal, MD;  Location: [facility] [place] [facility];  Service: Cardiovascular;  Laterality: Left;    Social History   Socioeconomic History   Marital status: Divorced    Spouse name: Not on file   Number of children: Not on file   Years of education: Not on file   Highest education level: Not on file  Occupational History   Not on file  Tobacco Use   Smoking status: Former    Types: Cigars   Smokeless tobacco: Former    Quit date: 09/30/2017   Tobacco comments:    occasional  Vaping Use   Vaping Use: Never used  Substance and Sexual Activity   Alcohol use: Yes    Alcohol/week: 1.0 standard drink    Types: 1 Cans of beer per week    Comment: beer   Drug use: Not Currently   Sexual activity: Not on file  Other Topics Concern   Not on file  Social History Narrative   Not on file   Social Determinants of Health   Financial Resource  Strain: Not on file  Food Insecurity: Not on file  Transportation Needs: Not on file  Physical Activity: Not on file  Stress: Not on file  Social Connections: Not on file  Intimate Partner Violence: Not on file    Current Outpatient Medications on File Prior to Visit  Medication Sig Dispense Refill   allopurinol (ZYLOPRIM) 100 MG tablet Take 100 mg by mouth daily.     amLODipine (NORVASC) 10 MG tablet Take 5 mg by mouth daily as needed (if bp is 190/90 or higher).      ascorbic acid (VITAMIN C) 500 MG tablet Take 500 mg by mouth 3 (three) times a week.     atenolol (TENORMIN) 50 MG tablet Take 25 mg by mouth daily as needed (if bp is 190/90 or higher).      B Complex-C-Folic Acid (RENA-VITE RX) 1 MG TABS Take 1 tablet by mouth 3 (three) times a week.      bromocriptine (PARLODEL) 2.5 MG tablet TAKE 1 TABLET BY MOUTH AT BEDTIME. 90 tablet 3   calcium acetate (PHOSLO) 667 MG capsule Take 1,334-2,001 mg by mouth See admin instructions. Take 2001 mg by mouth with each meal & take 1334 mg by mouth with each snack.     cinacalcet (SENSIPAR) 30 MG tablet Take 30 mg by mouth daily.     CINNAMON  PO Take 500 mg by mouth 2 (two) times daily.      DOCOSAHEXAENOIC ACID-EPA PO Take by mouth.     GAVILYTE-C 240 g solution Take by mouth as directed.     ibuprofen (ADVIL) 200 MG tablet Take 200 mg by mouth every 6 (six) hours as needed for headache or moderate pain.     Insulin Glargine (LANTUS SOLOSTAR) 100 UNIT/ML Solostar Pen Inject 15 Units into the skin at bedtime.      lidocaine-prilocaine (EMLA) cream Apply 1 application topically as needed (port access).     loratadine (CLARITIN) 10 MG tablet Take 10 mg by mouth daily as needed for allergies.     melatonin 5 MG TABS Take 5 mg by mouth at bedtime as needed (sleep).      metolazone (ZAROXOLYN) 5 MG tablet Take 5 mg by mouth every Monday, Wednesday, and Friday.      Multiple Vitamin (MULTIVITAMIN WITH MINERALS) TABS tablet Take 1 tablet by mouth 3 (three) times a week. Men's Multivitamin     Multiple Vitamins-Minerals (EYE HEALTH PO) Take 1 capsule by mouth daily.      NONFORMULARY OR COMPOUNDED ITEM Trimix (30/1/10)-(Pap/Phent/PGE)  Test Dose  65ml vial   Qty #3 [place] [phone] Fax [phone] 3 each 0   Omega 3-6-9 Fatty Acids (OMEGA-3-6-9 PO) Take 1 capsule by mouth 3 (three) times a week.     Polyethyl Glycol-Propyl Glycol (SYSTANE OP) Place 1 drop into both eyes daily as needed (dry eyes).     sildenafil (VIAGRA) 25 MG tablet TAKE 1 TABLET BY MOUTH ONCE DAILY AS NEEDED FOR ERECTILE DYSFUNCTION 30 tablet 0   warfarin (COUMADIN) 5 MG tablet Take 10 mg by mouth daily.      Current Facility-Administered Medications on File Prior to Visit  Medication Dose Route Frequency Provider Last Rate Last Admin   0.9 %  sodium chloride infusion   Intravenous PRN Causey, Charlestine Massed, NP        Allergies  Allergen Reactions   Lisinopril Swelling    Lips mouth    Furosemide Cough  Light headed, blurry vision, weakness.   Adhesive [Tape] Itching    Family History  Problem Relation Age of Onset    Diabetes Father    Kidney disease Father    Diabetes Daughter     BP (!) 170/40   Pulse 68   Ht 6\' 4"  (1.93 m)   Wt (!) 402 lb (182.3 kg)   SpO2 98%   BMI 48.93 kg/m    Review of Systems     Objective:   Physical Exam VITAL SIGNS:  See vs page GENERAL: no distress EXT: bilat leg edema (L>R)   Lab Results  Component Value Date   TESTOSTERONE 294 08/05/2021   Prolactin=10    Assessment & Plan:  Hyperprolactinemia: well-controlled.  Please continue the same parlodel Low testosterone: recheck today.

## 2021-08-05 NOTE — Patient Instructions (Addendum)
Your blood pressure is high again today.  Please follow this up at dialysis.   Blood tests are requested for you today.  We'll let you know about the results.  Our plan will be to get the prolactin back to normal, then to see if this gets the testosterone normal.   Please come back for a follow-up appointment in 6 months.

## 2021-08-06 DIAGNOSIS — N186 End stage renal disease: Secondary | ICD-10-CM | POA: Diagnosis not present

## 2021-08-06 DIAGNOSIS — Z7901 Long term (current) use of anticoagulants: Secondary | ICD-10-CM | POA: Diagnosis not present

## 2021-08-06 DIAGNOSIS — Z992 Dependence on renal dialysis: Secondary | ICD-10-CM | POA: Diagnosis not present

## 2021-08-06 DIAGNOSIS — N2581 Secondary hyperparathyroidism of renal origin: Secondary | ICD-10-CM | POA: Diagnosis not present

## 2021-08-07 LAB — TESTOSTERONE,FREE AND TOTAL
Testosterone, Free: 16.1 pg/mL (ref 7.2–24.0)
Testosterone: 294 ng/dL (ref 264–916)

## 2021-08-08 DIAGNOSIS — N186 End stage renal disease: Secondary | ICD-10-CM | POA: Diagnosis not present

## 2021-08-08 DIAGNOSIS — N2581 Secondary hyperparathyroidism of renal origin: Secondary | ICD-10-CM | POA: Diagnosis not present

## 2021-08-08 DIAGNOSIS — Z992 Dependence on renal dialysis: Secondary | ICD-10-CM | POA: Diagnosis not present

## 2021-08-11 DIAGNOSIS — M1030 Gout due to renal impairment, unspecified site: Secondary | ICD-10-CM | POA: Diagnosis not present

## 2021-08-11 DIAGNOSIS — I871 Compression of vein: Secondary | ICD-10-CM | POA: Diagnosis not present

## 2021-08-11 DIAGNOSIS — T82868A Thrombosis of vascular prosthetic devices, implants and grafts, initial encounter: Secondary | ICD-10-CM | POA: Diagnosis not present

## 2021-08-11 DIAGNOSIS — E1122 Type 2 diabetes mellitus with diabetic chronic kidney disease: Secondary | ICD-10-CM | POA: Diagnosis not present

## 2021-08-11 DIAGNOSIS — N186 End stage renal disease: Secondary | ICD-10-CM | POA: Diagnosis not present

## 2021-08-11 DIAGNOSIS — T82858A Stenosis of vascular prosthetic devices, implants and grafts, initial encounter: Secondary | ICD-10-CM | POA: Diagnosis not present

## 2021-08-11 DIAGNOSIS — G4730 Sleep apnea, unspecified: Secondary | ICD-10-CM | POA: Diagnosis not present

## 2021-08-11 DIAGNOSIS — Z992 Dependence on renal dialysis: Secondary | ICD-10-CM | POA: Diagnosis not present

## 2021-08-11 DIAGNOSIS — I120 Hypertensive chronic kidney disease with stage 5 chronic kidney disease or end stage renal disease: Secondary | ICD-10-CM | POA: Diagnosis not present

## 2021-08-12 DIAGNOSIS — N2581 Secondary hyperparathyroidism of renal origin: Secondary | ICD-10-CM | POA: Diagnosis not present

## 2021-08-12 DIAGNOSIS — N186 End stage renal disease: Secondary | ICD-10-CM | POA: Diagnosis not present

## 2021-08-12 DIAGNOSIS — Z992 Dependence on renal dialysis: Secondary | ICD-10-CM | POA: Diagnosis not present

## 2021-08-13 DIAGNOSIS — N2581 Secondary hyperparathyroidism of renal origin: Secondary | ICD-10-CM | POA: Diagnosis not present

## 2021-08-13 DIAGNOSIS — Z992 Dependence on renal dialysis: Secondary | ICD-10-CM | POA: Diagnosis not present

## 2021-08-13 DIAGNOSIS — N186 End stage renal disease: Secondary | ICD-10-CM | POA: Diagnosis not present

## 2021-08-15 DIAGNOSIS — N186 End stage renal disease: Secondary | ICD-10-CM | POA: Diagnosis not present

## 2021-08-15 DIAGNOSIS — N2581 Secondary hyperparathyroidism of renal origin: Secondary | ICD-10-CM | POA: Diagnosis not present

## 2021-08-15 DIAGNOSIS — Z992 Dependence on renal dialysis: Secondary | ICD-10-CM | POA: Diagnosis not present

## 2021-08-18 DIAGNOSIS — Z992 Dependence on renal dialysis: Secondary | ICD-10-CM | POA: Diagnosis not present

## 2021-08-18 DIAGNOSIS — N2581 Secondary hyperparathyroidism of renal origin: Secondary | ICD-10-CM | POA: Diagnosis not present

## 2021-08-18 DIAGNOSIS — Z7901 Long term (current) use of anticoagulants: Secondary | ICD-10-CM | POA: Diagnosis not present

## 2021-08-18 DIAGNOSIS — N186 End stage renal disease: Secondary | ICD-10-CM | POA: Diagnosis not present

## 2021-08-20 DIAGNOSIS — N2581 Secondary hyperparathyroidism of renal origin: Secondary | ICD-10-CM | POA: Diagnosis not present

## 2021-08-20 DIAGNOSIS — N186 End stage renal disease: Secondary | ICD-10-CM | POA: Diagnosis not present

## 2021-08-20 DIAGNOSIS — Z992 Dependence on renal dialysis: Secondary | ICD-10-CM | POA: Diagnosis not present

## 2021-08-21 DIAGNOSIS — Z992 Dependence on renal dialysis: Secondary | ICD-10-CM | POA: Diagnosis not present

## 2021-08-21 DIAGNOSIS — N186 End stage renal disease: Secondary | ICD-10-CM | POA: Diagnosis not present

## 2021-08-21 DIAGNOSIS — N2581 Secondary hyperparathyroidism of renal origin: Secondary | ICD-10-CM | POA: Diagnosis not present

## 2021-08-24 DIAGNOSIS — N2581 Secondary hyperparathyroidism of renal origin: Secondary | ICD-10-CM | POA: Diagnosis not present

## 2021-08-24 DIAGNOSIS — N186 End stage renal disease: Secondary | ICD-10-CM | POA: Diagnosis not present

## 2021-08-24 DIAGNOSIS — Z992 Dependence on renal dialysis: Secondary | ICD-10-CM | POA: Diagnosis not present

## 2021-08-25 DIAGNOSIS — Z7901 Long term (current) use of anticoagulants: Secondary | ICD-10-CM | POA: Diagnosis not present

## 2021-08-25 DIAGNOSIS — Z992 Dependence on renal dialysis: Secondary | ICD-10-CM | POA: Diagnosis not present

## 2021-08-25 DIAGNOSIS — N186 End stage renal disease: Secondary | ICD-10-CM | POA: Diagnosis not present

## 2021-08-27 DIAGNOSIS — Z992 Dependence on renal dialysis: Secondary | ICD-10-CM | POA: Diagnosis not present

## 2021-08-27 DIAGNOSIS — N186 End stage renal disease: Secondary | ICD-10-CM | POA: Diagnosis not present

## 2021-08-27 DIAGNOSIS — N2581 Secondary hyperparathyroidism of renal origin: Secondary | ICD-10-CM | POA: Diagnosis not present

## 2021-08-29 DIAGNOSIS — N186 End stage renal disease: Secondary | ICD-10-CM | POA: Diagnosis not present

## 2021-08-29 DIAGNOSIS — N2581 Secondary hyperparathyroidism of renal origin: Secondary | ICD-10-CM | POA: Diagnosis not present

## 2021-08-29 DIAGNOSIS — Z992 Dependence on renal dialysis: Secondary | ICD-10-CM | POA: Diagnosis not present

## 2021-09-01 DIAGNOSIS — Z7901 Long term (current) use of anticoagulants: Secondary | ICD-10-CM | POA: Diagnosis not present

## 2021-09-01 DIAGNOSIS — Z992 Dependence on renal dialysis: Secondary | ICD-10-CM | POA: Diagnosis not present

## 2021-09-01 DIAGNOSIS — N2581 Secondary hyperparathyroidism of renal origin: Secondary | ICD-10-CM | POA: Diagnosis not present

## 2021-09-01 DIAGNOSIS — N186 End stage renal disease: Secondary | ICD-10-CM | POA: Diagnosis not present

## 2021-09-03 DIAGNOSIS — N186 End stage renal disease: Secondary | ICD-10-CM | POA: Diagnosis not present

## 2021-09-03 DIAGNOSIS — N2581 Secondary hyperparathyroidism of renal origin: Secondary | ICD-10-CM | POA: Diagnosis not present

## 2021-09-03 DIAGNOSIS — Z992 Dependence on renal dialysis: Secondary | ICD-10-CM | POA: Diagnosis not present

## 2021-09-05 DIAGNOSIS — N2581 Secondary hyperparathyroidism of renal origin: Secondary | ICD-10-CM | POA: Diagnosis not present

## 2021-09-05 DIAGNOSIS — N186 End stage renal disease: Secondary | ICD-10-CM | POA: Diagnosis not present

## 2021-09-05 DIAGNOSIS — Z992 Dependence on renal dialysis: Secondary | ICD-10-CM | POA: Diagnosis not present

## 2021-09-08 DIAGNOSIS — Z7901 Long term (current) use of anticoagulants: Secondary | ICD-10-CM | POA: Diagnosis not present

## 2021-09-08 DIAGNOSIS — Z992 Dependence on renal dialysis: Secondary | ICD-10-CM | POA: Diagnosis not present

## 2021-09-08 DIAGNOSIS — N2581 Secondary hyperparathyroidism of renal origin: Secondary | ICD-10-CM | POA: Diagnosis not present

## 2021-09-08 DIAGNOSIS — N186 End stage renal disease: Secondary | ICD-10-CM | POA: Diagnosis not present

## 2021-09-10 DIAGNOSIS — Z992 Dependence on renal dialysis: Secondary | ICD-10-CM | POA: Diagnosis not present

## 2021-09-10 DIAGNOSIS — N186 End stage renal disease: Secondary | ICD-10-CM | POA: Diagnosis not present

## 2021-09-10 DIAGNOSIS — N2581 Secondary hyperparathyroidism of renal origin: Secondary | ICD-10-CM | POA: Diagnosis not present

## 2021-09-11 DIAGNOSIS — M1030 Gout due to renal impairment, unspecified site: Secondary | ICD-10-CM | POA: Diagnosis not present

## 2021-09-11 DIAGNOSIS — E1122 Type 2 diabetes mellitus with diabetic chronic kidney disease: Secondary | ICD-10-CM | POA: Diagnosis not present

## 2021-09-11 DIAGNOSIS — Z992 Dependence on renal dialysis: Secondary | ICD-10-CM | POA: Diagnosis not present

## 2021-09-11 DIAGNOSIS — G4730 Sleep apnea, unspecified: Secondary | ICD-10-CM | POA: Diagnosis not present

## 2021-09-11 DIAGNOSIS — I120 Hypertensive chronic kidney disease with stage 5 chronic kidney disease or end stage renal disease: Secondary | ICD-10-CM | POA: Diagnosis not present

## 2021-09-11 DIAGNOSIS — T82898A Other specified complication of vascular prosthetic devices, implants and grafts, initial encounter: Secondary | ICD-10-CM | POA: Diagnosis not present

## 2021-09-11 DIAGNOSIS — R233 Spontaneous ecchymoses: Secondary | ICD-10-CM | POA: Diagnosis not present

## 2021-09-11 DIAGNOSIS — N186 End stage renal disease: Secondary | ICD-10-CM | POA: Diagnosis not present

## 2021-09-12 DIAGNOSIS — N186 End stage renal disease: Secondary | ICD-10-CM | POA: Diagnosis not present

## 2021-09-12 DIAGNOSIS — N2581 Secondary hyperparathyroidism of renal origin: Secondary | ICD-10-CM | POA: Diagnosis not present

## 2021-09-12 DIAGNOSIS — Z992 Dependence on renal dialysis: Secondary | ICD-10-CM | POA: Diagnosis not present

## 2021-09-15 DIAGNOSIS — Z992 Dependence on renal dialysis: Secondary | ICD-10-CM | POA: Diagnosis not present

## 2021-09-15 DIAGNOSIS — N186 End stage renal disease: Secondary | ICD-10-CM | POA: Diagnosis not present

## 2021-09-15 DIAGNOSIS — N2581 Secondary hyperparathyroidism of renal origin: Secondary | ICD-10-CM | POA: Diagnosis not present

## 2021-09-15 DIAGNOSIS — Z7901 Long term (current) use of anticoagulants: Secondary | ICD-10-CM | POA: Diagnosis not present

## 2021-09-17 DIAGNOSIS — N186 End stage renal disease: Secondary | ICD-10-CM | POA: Diagnosis not present

## 2021-09-17 DIAGNOSIS — Z992 Dependence on renal dialysis: Secondary | ICD-10-CM | POA: Diagnosis not present

## 2021-09-17 DIAGNOSIS — N2581 Secondary hyperparathyroidism of renal origin: Secondary | ICD-10-CM | POA: Diagnosis not present

## 2021-09-19 DIAGNOSIS — N186 End stage renal disease: Secondary | ICD-10-CM | POA: Diagnosis not present

## 2021-09-19 DIAGNOSIS — Z992 Dependence on renal dialysis: Secondary | ICD-10-CM | POA: Diagnosis not present

## 2021-09-19 DIAGNOSIS — N2581 Secondary hyperparathyroidism of renal origin: Secondary | ICD-10-CM | POA: Diagnosis not present

## 2021-09-21 ENCOUNTER — Encounter (INDEPENDENT_AMBULATORY_CARE_PROVIDER_SITE_OTHER): Payer: Medicare HMO

## 2021-09-21 ENCOUNTER — Ambulatory Visit (INDEPENDENT_AMBULATORY_CARE_PROVIDER_SITE_OTHER): Payer: Medicare HMO | Admitting: Vascular Surgery

## 2021-09-22 DIAGNOSIS — N186 End stage renal disease: Secondary | ICD-10-CM | POA: Diagnosis not present

## 2021-09-22 DIAGNOSIS — Z7901 Long term (current) use of anticoagulants: Secondary | ICD-10-CM | POA: Diagnosis not present

## 2021-09-22 DIAGNOSIS — N2581 Secondary hyperparathyroidism of renal origin: Secondary | ICD-10-CM | POA: Diagnosis not present

## 2021-09-22 DIAGNOSIS — Z992 Dependence on renal dialysis: Secondary | ICD-10-CM | POA: Diagnosis not present

## 2021-09-22 DIAGNOSIS — E119 Type 2 diabetes mellitus without complications: Secondary | ICD-10-CM | POA: Diagnosis not present

## 2021-09-24 DIAGNOSIS — N2581 Secondary hyperparathyroidism of renal origin: Secondary | ICD-10-CM | POA: Diagnosis not present

## 2021-09-24 DIAGNOSIS — N186 End stage renal disease: Secondary | ICD-10-CM | POA: Diagnosis not present

## 2021-09-24 DIAGNOSIS — Z992 Dependence on renal dialysis: Secondary | ICD-10-CM | POA: Diagnosis not present

## 2021-09-26 DIAGNOSIS — N186 End stage renal disease: Secondary | ICD-10-CM | POA: Diagnosis not present

## 2021-09-26 DIAGNOSIS — N2581 Secondary hyperparathyroidism of renal origin: Secondary | ICD-10-CM | POA: Diagnosis not present

## 2021-09-26 DIAGNOSIS — Z992 Dependence on renal dialysis: Secondary | ICD-10-CM | POA: Diagnosis not present

## 2021-09-29 DIAGNOSIS — N186 End stage renal disease: Secondary | ICD-10-CM | POA: Diagnosis not present

## 2021-09-29 DIAGNOSIS — Z992 Dependence on renal dialysis: Secondary | ICD-10-CM | POA: Diagnosis not present

## 2021-09-29 DIAGNOSIS — N2581 Secondary hyperparathyroidism of renal origin: Secondary | ICD-10-CM | POA: Diagnosis not present

## 2021-10-01 DIAGNOSIS — Z992 Dependence on renal dialysis: Secondary | ICD-10-CM | POA: Diagnosis not present

## 2021-10-01 DIAGNOSIS — N186 End stage renal disease: Secondary | ICD-10-CM | POA: Diagnosis not present

## 2021-10-01 DIAGNOSIS — N2581 Secondary hyperparathyroidism of renal origin: Secondary | ICD-10-CM | POA: Diagnosis not present

## 2021-10-02 ENCOUNTER — Other Ambulatory Visit: Payer: Self-pay | Admitting: Urology

## 2021-10-03 DIAGNOSIS — Z992 Dependence on renal dialysis: Secondary | ICD-10-CM | POA: Diagnosis not present

## 2021-10-03 DIAGNOSIS — N186 End stage renal disease: Secondary | ICD-10-CM | POA: Diagnosis not present

## 2021-10-03 DIAGNOSIS — N2581 Secondary hyperparathyroidism of renal origin: Secondary | ICD-10-CM | POA: Diagnosis not present

## 2021-10-06 DIAGNOSIS — N2581 Secondary hyperparathyroidism of renal origin: Secondary | ICD-10-CM | POA: Diagnosis not present

## 2021-10-06 DIAGNOSIS — Z992 Dependence on renal dialysis: Secondary | ICD-10-CM | POA: Diagnosis not present

## 2021-10-06 DIAGNOSIS — N186 End stage renal disease: Secondary | ICD-10-CM | POA: Diagnosis not present

## 2021-10-06 DIAGNOSIS — Z7901 Long term (current) use of anticoagulants: Secondary | ICD-10-CM | POA: Diagnosis not present

## 2021-10-07 DIAGNOSIS — E113592 Type 2 diabetes mellitus with proliferative diabetic retinopathy without macular edema, left eye: Secondary | ICD-10-CM | POA: Diagnosis not present

## 2021-10-08 DIAGNOSIS — N186 End stage renal disease: Secondary | ICD-10-CM | POA: Diagnosis not present

## 2021-10-08 DIAGNOSIS — Z992 Dependence on renal dialysis: Secondary | ICD-10-CM | POA: Diagnosis not present

## 2021-10-08 DIAGNOSIS — N2581 Secondary hyperparathyroidism of renal origin: Secondary | ICD-10-CM | POA: Diagnosis not present

## 2021-10-10 DIAGNOSIS — N186 End stage renal disease: Secondary | ICD-10-CM | POA: Diagnosis not present

## 2021-10-10 DIAGNOSIS — N2581 Secondary hyperparathyroidism of renal origin: Secondary | ICD-10-CM | POA: Diagnosis not present

## 2021-10-10 DIAGNOSIS — Z992 Dependence on renal dialysis: Secondary | ICD-10-CM | POA: Diagnosis not present

## 2021-10-13 DIAGNOSIS — Z7901 Long term (current) use of anticoagulants: Secondary | ICD-10-CM | POA: Diagnosis not present

## 2021-10-13 DIAGNOSIS — N2581 Secondary hyperparathyroidism of renal origin: Secondary | ICD-10-CM | POA: Diagnosis not present

## 2021-10-13 DIAGNOSIS — N186 End stage renal disease: Secondary | ICD-10-CM | POA: Diagnosis not present

## 2021-10-13 DIAGNOSIS — Z992 Dependence on renal dialysis: Secondary | ICD-10-CM | POA: Diagnosis not present

## 2021-10-15 DIAGNOSIS — N186 End stage renal disease: Secondary | ICD-10-CM | POA: Diagnosis not present

## 2021-10-15 DIAGNOSIS — N2581 Secondary hyperparathyroidism of renal origin: Secondary | ICD-10-CM | POA: Diagnosis not present

## 2021-10-15 DIAGNOSIS — Z992 Dependence on renal dialysis: Secondary | ICD-10-CM | POA: Diagnosis not present

## 2021-10-17 DIAGNOSIS — N2581 Secondary hyperparathyroidism of renal origin: Secondary | ICD-10-CM | POA: Diagnosis not present

## 2021-10-17 DIAGNOSIS — Z992 Dependence on renal dialysis: Secondary | ICD-10-CM | POA: Diagnosis not present

## 2021-10-17 DIAGNOSIS — N186 End stage renal disease: Secondary | ICD-10-CM | POA: Diagnosis not present

## 2021-10-20 DIAGNOSIS — N186 End stage renal disease: Secondary | ICD-10-CM | POA: Diagnosis not present

## 2021-10-20 DIAGNOSIS — Z992 Dependence on renal dialysis: Secondary | ICD-10-CM | POA: Diagnosis not present

## 2021-10-20 DIAGNOSIS — N2581 Secondary hyperparathyroidism of renal origin: Secondary | ICD-10-CM | POA: Diagnosis not present

## 2021-10-20 DIAGNOSIS — Z7901 Long term (current) use of anticoagulants: Secondary | ICD-10-CM | POA: Diagnosis not present

## 2021-10-22 DIAGNOSIS — N186 End stage renal disease: Secondary | ICD-10-CM | POA: Diagnosis not present

## 2021-10-22 DIAGNOSIS — N2581 Secondary hyperparathyroidism of renal origin: Secondary | ICD-10-CM | POA: Diagnosis not present

## 2021-10-22 DIAGNOSIS — Z992 Dependence on renal dialysis: Secondary | ICD-10-CM | POA: Diagnosis not present

## 2021-10-24 DIAGNOSIS — N2581 Secondary hyperparathyroidism of renal origin: Secondary | ICD-10-CM | POA: Diagnosis not present

## 2021-10-24 DIAGNOSIS — Z992 Dependence on renal dialysis: Secondary | ICD-10-CM | POA: Diagnosis not present

## 2021-10-24 DIAGNOSIS — N186 End stage renal disease: Secondary | ICD-10-CM | POA: Diagnosis not present

## 2021-10-27 DIAGNOSIS — Z7901 Long term (current) use of anticoagulants: Secondary | ICD-10-CM | POA: Diagnosis not present

## 2021-10-27 DIAGNOSIS — N186 End stage renal disease: Secondary | ICD-10-CM | POA: Diagnosis not present

## 2021-10-27 DIAGNOSIS — Z992 Dependence on renal dialysis: Secondary | ICD-10-CM | POA: Diagnosis not present

## 2021-10-27 DIAGNOSIS — N2581 Secondary hyperparathyroidism of renal origin: Secondary | ICD-10-CM | POA: Diagnosis not present

## 2021-10-28 DIAGNOSIS — N186 End stage renal disease: Secondary | ICD-10-CM | POA: Diagnosis not present

## 2021-10-28 DIAGNOSIS — N2581 Secondary hyperparathyroidism of renal origin: Secondary | ICD-10-CM | POA: Diagnosis not present

## 2021-10-28 DIAGNOSIS — Z992 Dependence on renal dialysis: Secondary | ICD-10-CM | POA: Diagnosis not present

## 2021-10-29 DIAGNOSIS — N2581 Secondary hyperparathyroidism of renal origin: Secondary | ICD-10-CM | POA: Diagnosis not present

## 2021-10-29 DIAGNOSIS — Z992 Dependence on renal dialysis: Secondary | ICD-10-CM | POA: Diagnosis not present

## 2021-10-29 DIAGNOSIS — N186 End stage renal disease: Secondary | ICD-10-CM | POA: Diagnosis not present

## 2021-10-31 DIAGNOSIS — N186 End stage renal disease: Secondary | ICD-10-CM | POA: Diagnosis not present

## 2021-10-31 DIAGNOSIS — Z992 Dependence on renal dialysis: Secondary | ICD-10-CM | POA: Diagnosis not present

## 2021-10-31 DIAGNOSIS — N2581 Secondary hyperparathyroidism of renal origin: Secondary | ICD-10-CM | POA: Diagnosis not present

## 2021-11-03 DIAGNOSIS — N186 End stage renal disease: Secondary | ICD-10-CM | POA: Diagnosis not present

## 2021-11-03 DIAGNOSIS — Z992 Dependence on renal dialysis: Secondary | ICD-10-CM | POA: Diagnosis not present

## 2021-11-03 DIAGNOSIS — N2581 Secondary hyperparathyroidism of renal origin: Secondary | ICD-10-CM | POA: Diagnosis not present

## 2021-11-05 DIAGNOSIS — Z992 Dependence on renal dialysis: Secondary | ICD-10-CM | POA: Diagnosis not present

## 2021-11-05 DIAGNOSIS — N2581 Secondary hyperparathyroidism of renal origin: Secondary | ICD-10-CM | POA: Diagnosis not present

## 2021-11-05 DIAGNOSIS — N186 End stage renal disease: Secondary | ICD-10-CM | POA: Diagnosis not present

## 2021-11-07 DIAGNOSIS — N186 End stage renal disease: Secondary | ICD-10-CM | POA: Diagnosis not present

## 2021-11-07 DIAGNOSIS — N2581 Secondary hyperparathyroidism of renal origin: Secondary | ICD-10-CM | POA: Diagnosis not present

## 2021-11-07 DIAGNOSIS — Z992 Dependence on renal dialysis: Secondary | ICD-10-CM | POA: Diagnosis not present

## 2021-11-10 DIAGNOSIS — Z992 Dependence on renal dialysis: Secondary | ICD-10-CM | POA: Diagnosis not present

## 2021-11-10 DIAGNOSIS — N2581 Secondary hyperparathyroidism of renal origin: Secondary | ICD-10-CM | POA: Diagnosis not present

## 2021-11-10 DIAGNOSIS — N186 End stage renal disease: Secondary | ICD-10-CM | POA: Diagnosis not present

## 2021-11-11 ENCOUNTER — Other Ambulatory Visit: Payer: Self-pay | Admitting: Family Medicine

## 2021-11-11 MED ORDER — NONFORMULARY OR COMPOUNDED ITEM: 0 refills | Status: DC

## 2021-11-12 DIAGNOSIS — N186 End stage renal disease: Secondary | ICD-10-CM | POA: Diagnosis not present

## 2021-11-12 DIAGNOSIS — N2581 Secondary hyperparathyroidism of renal origin: Secondary | ICD-10-CM | POA: Diagnosis not present

## 2021-11-12 DIAGNOSIS — Z7901 Long term (current) use of anticoagulants: Secondary | ICD-10-CM | POA: Diagnosis not present

## 2021-11-12 DIAGNOSIS — Z992 Dependence on renal dialysis: Secondary | ICD-10-CM | POA: Diagnosis not present

## 2021-11-13 ENCOUNTER — Ambulatory Visit: Payer: Medicare HMO | Admitting: Urology

## 2021-11-15 DIAGNOSIS — N2581 Secondary hyperparathyroidism of renal origin: Secondary | ICD-10-CM | POA: Diagnosis not present

## 2021-11-15 DIAGNOSIS — Z992 Dependence on renal dialysis: Secondary | ICD-10-CM | POA: Diagnosis not present

## 2021-11-15 DIAGNOSIS — N186 End stage renal disease: Secondary | ICD-10-CM | POA: Diagnosis not present

## 2021-11-17 DIAGNOSIS — N2581 Secondary hyperparathyroidism of renal origin: Secondary | ICD-10-CM | POA: Diagnosis not present

## 2021-11-17 DIAGNOSIS — Z992 Dependence on renal dialysis: Secondary | ICD-10-CM | POA: Diagnosis not present

## 2021-11-17 DIAGNOSIS — Z7901 Long term (current) use of anticoagulants: Secondary | ICD-10-CM | POA: Diagnosis not present

## 2021-11-17 DIAGNOSIS — N186 End stage renal disease: Secondary | ICD-10-CM | POA: Diagnosis not present

## 2021-11-19 DIAGNOSIS — N2581 Secondary hyperparathyroidism of renal origin: Secondary | ICD-10-CM | POA: Diagnosis not present

## 2021-11-19 DIAGNOSIS — N186 End stage renal disease: Secondary | ICD-10-CM | POA: Diagnosis not present

## 2021-11-19 DIAGNOSIS — Z992 Dependence on renal dialysis: Secondary | ICD-10-CM | POA: Diagnosis not present

## 2021-11-21 DIAGNOSIS — N186 End stage renal disease: Secondary | ICD-10-CM | POA: Diagnosis not present

## 2021-11-21 DIAGNOSIS — Z992 Dependence on renal dialysis: Secondary | ICD-10-CM | POA: Diagnosis not present

## 2021-11-21 DIAGNOSIS — N2581 Secondary hyperparathyroidism of renal origin: Secondary | ICD-10-CM | POA: Diagnosis not present

## 2021-11-24 DIAGNOSIS — N2581 Secondary hyperparathyroidism of renal origin: Secondary | ICD-10-CM | POA: Diagnosis not present

## 2021-11-24 DIAGNOSIS — Z992 Dependence on renal dialysis: Secondary | ICD-10-CM | POA: Diagnosis not present

## 2021-11-24 DIAGNOSIS — Z7901 Long term (current) use of anticoagulants: Secondary | ICD-10-CM | POA: Diagnosis not present

## 2021-11-24 DIAGNOSIS — N186 End stage renal disease: Secondary | ICD-10-CM | POA: Diagnosis not present

## 2021-11-26 DIAGNOSIS — Z992 Dependence on renal dialysis: Secondary | ICD-10-CM | POA: Diagnosis not present

## 2021-11-26 DIAGNOSIS — N186 End stage renal disease: Secondary | ICD-10-CM | POA: Diagnosis not present

## 2021-11-26 DIAGNOSIS — N2581 Secondary hyperparathyroidism of renal origin: Secondary | ICD-10-CM | POA: Diagnosis not present

## 2021-11-27 DIAGNOSIS — N186 End stage renal disease: Secondary | ICD-10-CM | POA: Diagnosis not present

## 2021-11-27 DIAGNOSIS — Z992 Dependence on renal dialysis: Secondary | ICD-10-CM | POA: Diagnosis not present

## 2021-11-28 DIAGNOSIS — N2581 Secondary hyperparathyroidism of renal origin: Secondary | ICD-10-CM | POA: Diagnosis not present

## 2021-11-28 DIAGNOSIS — Z992 Dependence on renal dialysis: Secondary | ICD-10-CM | POA: Diagnosis not present

## 2021-11-28 DIAGNOSIS — N186 End stage renal disease: Secondary | ICD-10-CM | POA: Diagnosis not present

## 2021-12-01 DIAGNOSIS — Z992 Dependence on renal dialysis: Secondary | ICD-10-CM | POA: Diagnosis not present

## 2021-12-01 DIAGNOSIS — N186 End stage renal disease: Secondary | ICD-10-CM | POA: Diagnosis not present

## 2021-12-01 DIAGNOSIS — N2581 Secondary hyperparathyroidism of renal origin: Secondary | ICD-10-CM | POA: Diagnosis not present

## 2021-12-03 DIAGNOSIS — N186 End stage renal disease: Secondary | ICD-10-CM | POA: Diagnosis not present

## 2021-12-03 DIAGNOSIS — Z992 Dependence on renal dialysis: Secondary | ICD-10-CM | POA: Diagnosis not present

## 2021-12-03 DIAGNOSIS — N2581 Secondary hyperparathyroidism of renal origin: Secondary | ICD-10-CM | POA: Diagnosis not present

## 2021-12-05 DIAGNOSIS — Z992 Dependence on renal dialysis: Secondary | ICD-10-CM | POA: Diagnosis not present

## 2021-12-05 DIAGNOSIS — N2581 Secondary hyperparathyroidism of renal origin: Secondary | ICD-10-CM | POA: Diagnosis not present

## 2021-12-05 DIAGNOSIS — N186 End stage renal disease: Secondary | ICD-10-CM | POA: Diagnosis not present

## 2021-12-08 DIAGNOSIS — N186 End stage renal disease: Secondary | ICD-10-CM | POA: Diagnosis not present

## 2021-12-08 DIAGNOSIS — Z992 Dependence on renal dialysis: Secondary | ICD-10-CM | POA: Diagnosis not present

## 2021-12-08 DIAGNOSIS — N2581 Secondary hyperparathyroidism of renal origin: Secondary | ICD-10-CM | POA: Diagnosis not present

## 2021-12-09 ENCOUNTER — Ambulatory Visit: Payer: Medicare HMO | Admitting: Urology

## 2021-12-10 DIAGNOSIS — N186 End stage renal disease: Secondary | ICD-10-CM | POA: Diagnosis not present

## 2021-12-10 DIAGNOSIS — Z992 Dependence on renal dialysis: Secondary | ICD-10-CM | POA: Diagnosis not present

## 2021-12-10 DIAGNOSIS — N2581 Secondary hyperparathyroidism of renal origin: Secondary | ICD-10-CM | POA: Diagnosis not present

## 2021-12-11 DIAGNOSIS — G4730 Sleep apnea, unspecified: Secondary | ICD-10-CM | POA: Diagnosis not present

## 2021-12-11 DIAGNOSIS — Z95828 Presence of other vascular implants and grafts: Secondary | ICD-10-CM | POA: Diagnosis not present

## 2021-12-11 DIAGNOSIS — T82898A Other specified complication of vascular prosthetic devices, implants and grafts, initial encounter: Secondary | ICD-10-CM | POA: Diagnosis not present

## 2021-12-11 DIAGNOSIS — T82858A Stenosis of vascular prosthetic devices, implants and grafts, initial encounter: Secondary | ICD-10-CM | POA: Diagnosis not present

## 2021-12-11 DIAGNOSIS — E1122 Type 2 diabetes mellitus with diabetic chronic kidney disease: Secondary | ICD-10-CM | POA: Diagnosis not present

## 2021-12-11 DIAGNOSIS — N186 End stage renal disease: Secondary | ICD-10-CM | POA: Diagnosis not present

## 2021-12-11 DIAGNOSIS — I871 Compression of vein: Secondary | ICD-10-CM | POA: Diagnosis not present

## 2021-12-11 DIAGNOSIS — I120 Hypertensive chronic kidney disease with stage 5 chronic kidney disease or end stage renal disease: Secondary | ICD-10-CM | POA: Diagnosis not present

## 2021-12-11 DIAGNOSIS — Z992 Dependence on renal dialysis: Secondary | ICD-10-CM | POA: Diagnosis not present

## 2021-12-12 DIAGNOSIS — Z992 Dependence on renal dialysis: Secondary | ICD-10-CM | POA: Diagnosis not present

## 2021-12-12 DIAGNOSIS — N2581 Secondary hyperparathyroidism of renal origin: Secondary | ICD-10-CM | POA: Diagnosis not present

## 2021-12-12 DIAGNOSIS — N186 End stage renal disease: Secondary | ICD-10-CM | POA: Diagnosis not present

## 2021-12-15 DIAGNOSIS — N186 End stage renal disease: Secondary | ICD-10-CM | POA: Diagnosis not present

## 2021-12-15 DIAGNOSIS — N2581 Secondary hyperparathyroidism of renal origin: Secondary | ICD-10-CM | POA: Diagnosis not present

## 2021-12-15 DIAGNOSIS — Z992 Dependence on renal dialysis: Secondary | ICD-10-CM | POA: Diagnosis not present

## 2021-12-17 DIAGNOSIS — Z992 Dependence on renal dialysis: Secondary | ICD-10-CM | POA: Diagnosis not present

## 2021-12-17 DIAGNOSIS — N186 End stage renal disease: Secondary | ICD-10-CM | POA: Diagnosis not present

## 2021-12-17 DIAGNOSIS — N2581 Secondary hyperparathyroidism of renal origin: Secondary | ICD-10-CM | POA: Diagnosis not present

## 2021-12-19 DIAGNOSIS — N2581 Secondary hyperparathyroidism of renal origin: Secondary | ICD-10-CM | POA: Diagnosis not present

## 2021-12-19 DIAGNOSIS — N186 End stage renal disease: Secondary | ICD-10-CM | POA: Diagnosis not present

## 2021-12-19 DIAGNOSIS — Z992 Dependence on renal dialysis: Secondary | ICD-10-CM | POA: Diagnosis not present

## 2021-12-21 ENCOUNTER — Encounter (INDEPENDENT_AMBULATORY_CARE_PROVIDER_SITE_OTHER): Payer: Self-pay | Admitting: Vascular Surgery

## 2021-12-21 ENCOUNTER — Ambulatory Visit (INDEPENDENT_AMBULATORY_CARE_PROVIDER_SITE_OTHER): Payer: Medicare HMO

## 2021-12-21 ENCOUNTER — Ambulatory Visit (INDEPENDENT_AMBULATORY_CARE_PROVIDER_SITE_OTHER): Payer: Medicare HMO | Admitting: Vascular Surgery

## 2021-12-21 ENCOUNTER — Ambulatory Visit: Payer: Medicare HMO | Admitting: Endocrinology

## 2021-12-21 VITALS — BP 122/72 | HR 60 | Ht 76.0 in | Wt >= 6400 oz

## 2021-12-21 VITALS — BP 136/61 | HR 56 | Resp 16 | Wt >= 6400 oz

## 2021-12-21 DIAGNOSIS — E119 Type 2 diabetes mellitus without complications: Secondary | ICD-10-CM | POA: Diagnosis not present

## 2021-12-21 DIAGNOSIS — I890 Lymphedema, not elsewhere classified: Secondary | ICD-10-CM

## 2021-12-21 DIAGNOSIS — I10 Essential (primary) hypertension: Secondary | ICD-10-CM

## 2021-12-21 DIAGNOSIS — N186 End stage renal disease: Secondary | ICD-10-CM | POA: Diagnosis not present

## 2021-12-21 DIAGNOSIS — E221 Hyperprolactinemia: Secondary | ICD-10-CM | POA: Diagnosis not present

## 2021-12-21 DIAGNOSIS — I872 Venous insufficiency (chronic) (peripheral): Secondary | ICD-10-CM | POA: Diagnosis not present

## 2021-12-21 LAB — PROLACTIN: Prolactin: 9.1 ng/mL (ref 2.0–18.0)

## 2021-12-21 NOTE — Progress Notes (Signed)
? ? ? ? ?MRN : [id number] ? ?[name] is a 51 y.o. (Mar 19, 1971) male who presents with chief complaint of legs swell. ? ?History of Present Illness:  ? ?The patient returns to the office for followup of their dialysis access.  He reports recently the function of the access has been stable.  However, since his last visit he did end up having a angiogram with stent placement.  The patient denies increased bleeding time or increased recirculation. Patient denies difficulty with cannulation. The patient denies hand pain or other symptoms consistent with steal phenomena.  No significant arm swelling. ?  ?The patient denies redness or swelling at the access site. The patient denies fever or chills at home or while on dialysis. ?  ?He is also followed for lymphedema.  He notes his swelling has been very well controlled using a brand of compression socks that he has found called [employer].  He has also been using his lymph pump on a regular basis.  There have been no interval episodes of infection or cellulitis. ?  ?The patient denies recent episodes of angina or shortness of breath.  ?  ?Duplex ultrasound of the AV access shows a patent access with a moderate venous stenosis.  His volume access is 1025 cc/min.  The previously noted stenosis is increased compared to last study.   ? ?No outpatient medications have been marked as taking for the 12/21/21 encounter (Appointment) with Delana Meyer, Dolores Lory, MD.  ? ?Current Facility-Administered Medications for the 12/21/21 encounter (Appointment) with Delana Meyer, Dolores Lory, MD  ?Medication  ? 0.9 %  sodium chloride infusion  ? ? ?Past Medical History:  ?Diagnosis Date  ? Anemia   ? Chronic kidney disease   ? 14% FUNCTION  ? Diabetes mellitus without complication ([place])   ? Diabetic retinopathy ([place])   ? Dyspnea   ? DOE  ? Dysrhythmia   ? End stage renal disease ([place])   ? History of orthopnea   ? error wrong patietn  ? Hypercholesteremia   ? error  ? Hyperkalemia   ? error wrong patient   ? Hypertension   ? Long-term insulin use ([place])   ? Lymphedema of leg   ? Metabolic encephalopathy   ? error  ? Microalbuminuria   ? MSSA (methicillin susceptible Staphylococcus aureus)   ? Obesity   ? PVD (peripheral vascular disease) ([place])   ? Respiratory failure ([place])   ? Error  ? Sepsis ([place])   ? error  ? Sleep apnea   ? no CPAP  ? UTI (urinary tract infection)   ? error  ? Venous ulcer ([place])   ? Vitamin D deficiency   ? ? ?Past Surgical History:  ?Procedure Laterality Date  ? A/V FISTULAGRAM Right 10/24/2018  ? Procedure: A/V FISTULAGRAM;  Surgeon: Katha Cabal, MD;  Location: [facility];  Service: Cardiovascular;  Laterality: Right;  ? A/V FISTULAGRAM Right 11/24/2018  ? Procedure: A/V FISTULAGRAM;  Surgeon: Katha Cabal, MD;  Location: [facility];  Service: Cardiovascular;  Laterality: Right;  ? A/V FISTULAGRAM Left 07/31/2019  ? Procedure: A/V FISTULAGRAM;  Surgeon: Katha Cabal, MD;  Location: [place] [facility];  Service: Cardiovascular;  Laterality: Left;  ? A/V FISTULAGRAM Left 12/11/2019  ? Procedure: A/V FISTULAGRAM;  Surgeon: Katha Cabal, MD;  Location: [facility];  Service: Cardiovascular;  Laterality: Left;  ? A/V SHUNT INTERVENTION Right 02/21/2019  ? Procedure: A/V SHUNT INTERVENTION;  Surgeon: Delana Meyer,  Dolores Lory, MD;  Location: [place] [facility];  Service: Cardiovascular;  Laterality: Right;  ? APPLICATION OF WOUND VAC Left 10/03/2017  ? Procedure: APPLICATION OF WOUND VAC;  Surgeon: Algernon Huxley, MD;  Location: [facility];  Service: General;  Laterality: Left;  ? APPLICATION OF WOUND VAC Left 10/11/2017  ? Procedure: APPLICATION OF WOUND VAC;  Surgeon: Katha Cabal, MD;  Location: [facility];  Service: Vascular;  Laterality: Left;  ? APPLICATION OF WOUND VAC Left 10/14/2017  ? Procedure: WOUND VAC CHANGE;  Surgeon: Katha Cabal, MD;  Location: [facility];  Service: Vascular;  Laterality: Left;  left lower leg  ? APPLICATION OF WOUND VAC  Left 10/18/2017  ? Procedure: WOUND VAC CHANGE;  Surgeon: Katha Cabal, MD;  Location: [facility];  Service: Vascular;  Laterality: Left;  ? APPLICATION OF WOUND VAC Left 10/07/2017  ? Procedure: APPLICATION OF WOUND VAC;  Surgeon: Katha Cabal, MD;  Location: [facility];  Service: Vascular;  Laterality: Left;  ? AV FISTULA INSERTION W/ RF MAGNETIC GUIDANCE Right 07/19/2018  ? Procedure: AV FISTULA INSERTION W/RF MAGNETIC GUIDANCE;  Surgeon: Katha Cabal, MD;  Location: [facility];  Service: Cardiovascular;  Laterality: Right;  ? AV FISTULA PLACEMENT Left 05/11/2019  ? Procedure: ARTERIOVENOUS (AV) FISTULA CREATION (RADIOCEPHALIC );  Surgeon: Katha Cabal, MD;  Location: [facility];  Service: Vascular;  Laterality: Left;  ? AV FISTULA PLACEMENT Left 02/20/2020  ? Procedure: INSERTION OF ARTERIOVENOUS (AV) GORE-TEX GRAFT LEFT ARM ( FOREARM LOOP );  Surgeon: Katha Cabal, MD;  Location: [facility];  Service: Vascular;  Laterality: Left;  ? CATARACT EXTRACTION W/PHACO Left 11/01/2018  ? Procedure: CATARACT EXTRACTION PHACO AND INTRAOCULAR LENS PLACEMENT (IOC)-LEFT, DIABETIC-INSULIN DEPENDENT;  Surgeon: Eulogio Bear, MD;  Location: [facility];  Service: Ophthalmology;  Laterality: Left;  [place] 00:41.8 ?CDE 4.61 ?Fluid Pack Lot # [id number]  ? CATARACT EXTRACTION W/PHACO Right 04/27/2019  ? Procedure: CATARACT EXTRACTION PHACO AND INTRAOCULAR LENS PLACEMENT (IOC);  Surgeon: Eulogio Bear, MD;  Location: [facility];  Service: Ophthalmology;  Laterality: Right;  [place] 00:34 ?CDE 1.97 ?FLUID PACK LOT # [id number] ?  ? COLONOSCOPY WITH PROPOFOL N/A 05/12/2020  ? Procedure: COLONOSCOPY WITH PROPOFOL;  Surgeon: Toledo, Benay Pike, MD;  Location: [facility];  Service: Gastroenterology;  Laterality: N/A;  ? DIALYSIS/PERMA CATHETER INSERTION N/A 02/23/2019  ? Procedure: DIALYSIS/PERMA CATHETER INSERTION;  Surgeon: Algernon Huxley, MD;  Location: [facility];  Service: Cardiovascular;  Laterality: N/A;   ? DIALYSIS/PERMA CATHETER REMOVAL N/A 04/03/2020  ? Procedure: DIALYSIS/PERMA CATHETER REMOVAL;  Surgeon: Katha Cabal, MD;  Location: [place] [facility];  Service: Cardiovascular;  Laterality: N/A;  ? I & D EXTREMITY Left 10/11/2017  ? Procedure: IRRIGATION AND DEBRIDEMENT EXTREMITY;  Surgeon: Katha Cabal, MD;  Location: [facility];  Service: Vascular;  Laterality: Left;  ? INCISION AND DRAINAGE ABSCESS Right 10/07/2017  ? Procedure: INCISION AND DRAINAGE ABSCESS;  Surgeon: Katha Cabal, MD;  Location: [facility];  Service: Vascular;  Laterality: Right;  ? IRRIGATION AND DEBRIDEMENT ABSCESS Left 10/03/2017  ? Procedure: IRRIGATION AND DEBRIDEMENT ABSCESS with debridement of skin, soft tissue, muscle 50sq cm;  Surgeon: Algernon Huxley, MD;  Location: [facility];  Service: General;  Laterality: Left;  ? TEMPORARY DIALYSIS CATHETER  02/20/2019  ? Procedure: TEMPORARY DIALYSIS CATHETER;  Surgeon: Katha Cabal, MD;  Location: [place] [facility];  Service: Cardiovascular;;  ? UPPER EXTREMITY ANGIOGRAPHY Left  09/18/2019  ? Procedure: UPPER EXTREMITY ANGIOGRAPHY;  Surgeon: Katha Cabal, MD;  Location: [facility];  Service: Cardiovascular;  Laterality: Left;  ? ? ?Social History ?Social History  ? ?Tobacco Use  ? Smoking status: Former  ?  Types: Cigars  ? Smokeless tobacco: Former  ?  Quit date: 09/30/2017  ? Tobacco comments:  ?  occasional  ?Vaping Use  ? Vaping Use: Never used  ?Substance Use Topics  ? Alcohol use: Yes  ?  Alcohol/week: 1.0 standard drink  ?  Types: 1 Cans of beer per week  ?  Comment: beer  ? Drug use: Not Currently  ? ? ?Family History ?Family History  ?Problem Relation Age of Onset  ? Diabetes Father   ? Kidney disease Father   ? Diabetes Daughter   ? ? ?Allergies  ?Allergen Reactions  ? Lisinopril Swelling  ?  Lips mouth   ? Furosemide Cough  ?  Light headed, blurry vision, weakness.  ? Adhesive [Tape] Itching  ? ? ? ?REVIEW OF SYSTEMS (Negative unless  checked) ? ?Constitutional: '[]'$ Weight loss  '[]'$ Fever  '[]'$ Chills ?Cardiac: '[]'$ Chest pain   '[]'$ Chest pressure   '[]'$ Palpitations   '[]'$ Shortness of breath when laying flat   '[]'$ Shortness of breath with exertion. ?Vascular:  '[]'$ Pain in le

## 2021-12-21 NOTE — Progress Notes (Signed)
? ?Subjective:  ? ? Patient ID: [name], male    DOB: 10-16-1970, 51 y.o.   MRN: [id number] ? ?HPI ?Pt returns for f/u of hyperprolactinemia (dx'ed 2021; only cause found was ESRD; low testosterone was also noted then; he was rx'ed androgel by [facility]; he has 2 biological children; ON dex test was normal; f/u T was normal on parlodel and gel; MRI (2021) showed 6 mm adenoma; on parlodel in 2022, prolactin and testosterone normalized; he is on HD).  He takes parlodel as rx'ed.  pt states he feels well in general.   ?Past Medical History:  ?Diagnosis Date  ? Anemia   ? Chronic kidney disease   ? 14% FUNCTION  ? Diabetes mellitus without complication ([place])   ? Diabetic retinopathy ([place])   ? Dyspnea   ? DOE  ? Dysrhythmia   ? End stage renal disease ([place])   ? History of orthopnea   ? error wrong patietn  ? Hypercholesteremia   ? error  ? Hyperkalemia   ? error wrong patient  ? Hypertension   ? Long-term insulin use ([place])   ? Lymphedema of leg   ? Metabolic encephalopathy   ? error  ? Microalbuminuria   ? MSSA (methicillin susceptible Staphylococcus aureus)   ? Obesity   ? PVD (peripheral vascular disease) ([place])   ? Respiratory failure ([place])   ? Error  ? Sepsis ([place])   ? error  ? Sleep apnea   ? no CPAP  ? UTI (urinary tract infection)   ? error  ? Venous ulcer ([place])   ? Vitamin D deficiency   ? ? ?Past Surgical History:  ?Procedure Laterality Date  ? A/V FISTULAGRAM Right 10/24/2018  ? Procedure: A/V FISTULAGRAM;  Surgeon: Katha Cabal, MD;  Location: [place] [facility] [facility];  Service: Cardiovascular;  Laterality: Right;  ? A/V FISTULAGRAM Right 11/24/2018  ? Procedure: A/V FISTULAGRAM;  Surgeon: Katha Cabal, MD;  Location: [facility];  Service: Cardiovascular;  Laterality: Right;  ? A/V FISTULAGRAM Left 07/31/2019  ? Procedure: A/V FISTULAGRAM;  Surgeon: Katha Cabal, MD;  Location: [facility];  Service: Cardiovascular;  Laterality: Left;  ? A/V FISTULAGRAM Left 12/11/2019  ? Procedure:  A/V FISTULAGRAM;  Surgeon: Katha Cabal, MD;  Location: [facility];  Service: Cardiovascular;  Laterality: Left;  ? A/V SHUNT INTERVENTION Right 02/21/2019  ? Procedure: A/V SHUNT INTERVENTION;  Surgeon: Katha Cabal, MD;  Location: [facility];  Service: Cardiovascular;  Laterality: Right;  ? APPLICATION OF WOUND VAC Left 10/03/2017  ? Procedure: APPLICATION OF WOUND VAC;  Surgeon: Algernon Huxley, MD;  Location: [facility];  Service: General;  Laterality: Left;  ? APPLICATION OF WOUND VAC Left 10/11/2017  ? Procedure: APPLICATION OF WOUND VAC;  Surgeon: Katha Cabal, MD;  Location: [facility];  Service: Vascular;  Laterality: Left;  ? APPLICATION OF WOUND VAC Left 10/14/2017  ? Procedure: WOUND VAC CHANGE;  Surgeon: Katha Cabal, MD;  Location: [facility];  Service: Vascular;  Laterality: Left;  left lower leg  ? APPLICATION OF WOUND VAC Left 10/18/2017  ? Procedure: WOUND VAC CHANGE;  Surgeon: Katha Cabal, MD;  Location: [facility];  Service: Vascular;  Laterality: Left;  ? APPLICATION OF WOUND VAC Left 10/07/2017  ? Procedure: APPLICATION OF WOUND VAC;  Surgeon: Katha Cabal, MD;  Location: [facility];  Service: Vascular;  Laterality: Left;  ? AV FISTULA INSERTION W/ RF  MAGNETIC GUIDANCE Right 07/19/2018  ? Procedure: AV FISTULA INSERTION W/RF MAGNETIC GUIDANCE;  Surgeon: Katha Cabal, MD;  Location: [facility];  Service: Cardiovascular;  Laterality: Right;  ? AV FISTULA PLACEMENT Left 05/11/2019  ? Procedure: ARTERIOVENOUS (AV) FISTULA CREATION (RADIOCEPHALIC );  Surgeon: Katha Cabal, MD;  Location: [facility];  Service: Vascular;  Laterality: Left;  ? AV FISTULA PLACEMENT Left 02/20/2020  ? Procedure: INSERTION OF ARTERIOVENOUS (AV) GORE-TEX GRAFT LEFT ARM ( FOREARM LOOP );  Surgeon: Katha Cabal, MD;  Location: [facility];  Service: Vascular;  Laterality: Left;  ? CATARACT EXTRACTION W/PHACO Left 11/01/2018  ? Procedure: CATARACT EXTRACTION PHACO AND  INTRAOCULAR LENS PLACEMENT (IOC)-LEFT, DIABETIC-INSULIN DEPENDENT;  Surgeon: Eulogio Bear, MD;  Location: [facility];  Service: Ophthalmology;  Laterality: Left;  [place] 00:41.8 ?CDE 4.61 ?Fluid Pack Lot # [id number]  ? CATARACT EXTRACTION W/PHACO Right 04/27/2019  ? Procedure: CATARACT EXTRACTION PHACO AND INTRAOCULAR LENS PLACEMENT (IOC);  Surgeon: Eulogio Bear, MD;  Location: [facility];  Service: Ophthalmology;  Laterality: Right;  [place] 00:34 ?CDE 1.97 ?FLUID PACK LOT # [id number] H ?  ? COLONOSCOPY WITH PROPOFOL N/A 05/12/2020  ? Procedure: COLONOSCOPY WITH PROPOFOL;  Surgeon: Toledo, Benay Pike, MD;  Location: [facility];  Service: Gastroenterology;  Laterality: N/A;  ? DIALYSIS/PERMA CATHETER INSERTION N/A 02/23/2019  ? Procedure: DIALYSIS/PERMA CATHETER INSERTION;  Surgeon: Algernon Huxley, MD;  Location: [place];  Service: Cardiovascular;  Laterality: N/A;  ? DIALYSIS/PERMA CATHETER REMOVAL N/A 04/03/2020  ? Procedure: DIALYSIS/PERMA CATHETER REMOVAL;  Surgeon: Katha Cabal, MD;  Location: [place] [facility];  Service: Cardiovascular;  Laterality: N/A;  ? I & D EXTREMITY Left 10/11/2017  ? Procedure: IRRIGATION AND DEBRIDEMENT EXTREMITY;  Surgeon: Katha Cabal, MD;  Location: [facility];  Service: Vascular;  Laterality: Left;  ? INCISION AND DRAINAGE ABSCESS Right 10/07/2017  ? Procedure: INCISION AND DRAINAGE ABSCESS;  Surgeon: Katha Cabal, MD;  Location: [facility];  Service: Vascular;  Laterality: Right;  ? IRRIGATION AND DEBRIDEMENT ABSCESS Left 10/03/2017  ? Procedure: IRRIGATION AND DEBRIDEMENT ABSCESS with debridement of skin, soft tissue, muscle 50sq cm;  Surgeon: Algernon Huxley, MD;  Location: [facility];  Service: General;  Laterality: Left;  ? TEMPORARY DIALYSIS CATHETER  02/20/2019  ? Procedure: TEMPORARY DIALYSIS CATHETER;  Surgeon: Katha Cabal, MD;  Location: [place] [facility];  Service: Cardiovascular;;  ? UPPER EXTREMITY ANGIOGRAPHY Left 09/18/2019  ? Procedure: UPPER  EXTREMITY ANGIOGRAPHY;  Surgeon: Katha Cabal, MD;  Location: [facility] [facility];  Service: Cardiovascular;  Laterality: Left;  ? ? ?Social History  ? ?Socioeconomic History  ? Marital status: Divorced  ?  Spouse name: Not on file  ? Number of children: Not on file  ? Years of education: Not on file  ? Highest education level: Not on file  ?Occupational History  ? Not on file  ?Tobacco Use  ? Smoking status: Former  ?  Types: Cigars  ? Smokeless tobacco: Former  ?  Quit date: 09/30/2017  ? Tobacco comments:  ?  occasional  ?Vaping Use  ? Vaping Use: Never used  ?Substance and Sexual Activity  ? Alcohol use: Yes  ?  Alcohol/week: 1.0 standard drink  ?  Types: 1 Cans of beer per week  ?  Comment: beer  ? Drug use: Not Currently  ? Sexual activity: Not on file  ?Other Topics Concern  ? Not on file  ?Social History Narrative  ?  Not on file  ? ?Social Determinants of Health  ? ?Financial Resource Strain: Not on file  ?Food Insecurity: Not on file  ?Transportation Needs: Not on file  ?Physical Activity: Not on file  ?Stress: Not on file  ?Social Connections: Not on file  ?Intimate Partner Violence: Not on file  ? ? ?Current Outpatient Medications on File Prior to Visit  ?Medication Sig Dispense Refill  ? allopurinol (ZYLOPRIM) 100 MG tablet Take 100 mg by mouth daily.    ? amLODipine (NORVASC) 10 MG tablet Take 5 mg by mouth daily as needed (if bp is 190/90 or higher).     ? ascorbic acid (VITAMIN C) 500 MG tablet Take 500 mg by mouth 3 (three) times a week.    ? atenolol (TENORMIN) 50 MG tablet Take 25 mg by mouth daily as needed (if bp is 190/90 or higher).     ? B Complex-C-Folic Acid (RENA-VITE RX) 1 MG TABS Take 1 tablet by mouth 3 (three) times a week.     ? bromocriptine (PARLODEL) 2.5 MG tablet TAKE 1 TABLET BY MOUTH AT BEDTIME. 90 tablet 3  ? calcium acetate (PHOSLO) 667 MG capsule Take 1,334-2,001 mg by mouth See admin instructions. Take 2001 mg by mouth with each meal & take 1334 mg by mouth with each  snack.    ? cinacalcet (SENSIPAR) 30 MG tablet Take 30 mg by mouth daily.    ? CINNAMON PO Take 500 mg by mouth 2 (two) times daily.     ? DOCOSAHEXAENOIC ACID-EPA PO Take by mouth.    ? GAVILYTE-C 240 g solu

## 2021-12-21 NOTE — Patient Instructions (Addendum)
Blood tests are requested for you today.  We'll let you know about the results.    ?You should have an endocrinology follow-up appointment in 1 year.   ?

## 2021-12-22 DIAGNOSIS — N186 End stage renal disease: Secondary | ICD-10-CM | POA: Diagnosis not present

## 2021-12-22 DIAGNOSIS — Z992 Dependence on renal dialysis: Secondary | ICD-10-CM | POA: Diagnosis not present

## 2021-12-22 DIAGNOSIS — Z794 Long term (current) use of insulin: Secondary | ICD-10-CM | POA: Diagnosis not present

## 2021-12-22 DIAGNOSIS — E119 Type 2 diabetes mellitus without complications: Secondary | ICD-10-CM | POA: Diagnosis not present

## 2021-12-22 DIAGNOSIS — N2581 Secondary hyperparathyroidism of renal origin: Secondary | ICD-10-CM | POA: Diagnosis not present

## 2021-12-24 DIAGNOSIS — N186 End stage renal disease: Secondary | ICD-10-CM | POA: Diagnosis not present

## 2021-12-24 DIAGNOSIS — N2581 Secondary hyperparathyroidism of renal origin: Secondary | ICD-10-CM | POA: Diagnosis not present

## 2021-12-24 DIAGNOSIS — Z992 Dependence on renal dialysis: Secondary | ICD-10-CM | POA: Diagnosis not present

## 2021-12-26 DIAGNOSIS — N186 End stage renal disease: Secondary | ICD-10-CM | POA: Diagnosis not present

## 2021-12-26 DIAGNOSIS — N2581 Secondary hyperparathyroidism of renal origin: Secondary | ICD-10-CM | POA: Diagnosis not present

## 2021-12-26 DIAGNOSIS — Z992 Dependence on renal dialysis: Secondary | ICD-10-CM | POA: Diagnosis not present

## 2021-12-27 DIAGNOSIS — N186 End stage renal disease: Secondary | ICD-10-CM | POA: Diagnosis not present

## 2021-12-27 DIAGNOSIS — Z992 Dependence on renal dialysis: Secondary | ICD-10-CM | POA: Diagnosis not present

## 2021-12-28 DIAGNOSIS — N2581 Secondary hyperparathyroidism of renal origin: Secondary | ICD-10-CM | POA: Diagnosis not present

## 2021-12-28 DIAGNOSIS — N186 End stage renal disease: Secondary | ICD-10-CM | POA: Diagnosis not present

## 2021-12-28 DIAGNOSIS — Z992 Dependence on renal dialysis: Secondary | ICD-10-CM | POA: Diagnosis not present

## 2021-12-29 DIAGNOSIS — Z992 Dependence on renal dialysis: Secondary | ICD-10-CM | POA: Diagnosis not present

## 2021-12-29 DIAGNOSIS — Z5181 Encounter for therapeutic drug level monitoring: Secondary | ICD-10-CM | POA: Diagnosis not present

## 2021-12-29 DIAGNOSIS — N2581 Secondary hyperparathyroidism of renal origin: Secondary | ICD-10-CM | POA: Diagnosis not present

## 2021-12-29 DIAGNOSIS — Z7901 Long term (current) use of anticoagulants: Secondary | ICD-10-CM | POA: Diagnosis not present

## 2021-12-29 DIAGNOSIS — N186 End stage renal disease: Secondary | ICD-10-CM | POA: Diagnosis not present

## 2021-12-31 DIAGNOSIS — N186 End stage renal disease: Secondary | ICD-10-CM | POA: Diagnosis not present

## 2021-12-31 DIAGNOSIS — Z992 Dependence on renal dialysis: Secondary | ICD-10-CM | POA: Diagnosis not present

## 2021-12-31 DIAGNOSIS — N2581 Secondary hyperparathyroidism of renal origin: Secondary | ICD-10-CM | POA: Diagnosis not present

## 2022-01-01 DIAGNOSIS — E1122 Type 2 diabetes mellitus with diabetic chronic kidney disease: Secondary | ICD-10-CM | POA: Diagnosis not present

## 2022-01-01 DIAGNOSIS — M1030 Gout due to renal impairment, unspecified site: Secondary | ICD-10-CM | POA: Diagnosis not present

## 2022-01-01 DIAGNOSIS — T82898A Other specified complication of vascular prosthetic devices, implants and grafts, initial encounter: Secondary | ICD-10-CM | POA: Diagnosis not present

## 2022-01-01 DIAGNOSIS — N186 End stage renal disease: Secondary | ICD-10-CM | POA: Diagnosis not present

## 2022-01-01 DIAGNOSIS — I120 Hypertensive chronic kidney disease with stage 5 chronic kidney disease or end stage renal disease: Secondary | ICD-10-CM | POA: Diagnosis not present

## 2022-01-01 DIAGNOSIS — Z992 Dependence on renal dialysis: Secondary | ICD-10-CM | POA: Diagnosis not present

## 2022-01-01 DIAGNOSIS — T82838A Hemorrhage of vascular prosthetic devices, implants and grafts, initial encounter: Secondary | ICD-10-CM | POA: Diagnosis not present

## 2022-01-01 DIAGNOSIS — G4730 Sleep apnea, unspecified: Secondary | ICD-10-CM | POA: Diagnosis not present

## 2022-01-02 DIAGNOSIS — Z992 Dependence on renal dialysis: Secondary | ICD-10-CM | POA: Diagnosis not present

## 2022-01-02 DIAGNOSIS — N2581 Secondary hyperparathyroidism of renal origin: Secondary | ICD-10-CM | POA: Diagnosis not present

## 2022-01-02 DIAGNOSIS — N186 End stage renal disease: Secondary | ICD-10-CM | POA: Diagnosis not present

## 2022-01-05 DIAGNOSIS — N2581 Secondary hyperparathyroidism of renal origin: Secondary | ICD-10-CM | POA: Diagnosis not present

## 2022-01-05 DIAGNOSIS — N186 End stage renal disease: Secondary | ICD-10-CM | POA: Diagnosis not present

## 2022-01-05 DIAGNOSIS — Z5181 Encounter for therapeutic drug level monitoring: Secondary | ICD-10-CM | POA: Diagnosis not present

## 2022-01-05 DIAGNOSIS — Z7901 Long term (current) use of anticoagulants: Secondary | ICD-10-CM | POA: Diagnosis not present

## 2022-01-05 DIAGNOSIS — Z992 Dependence on renal dialysis: Secondary | ICD-10-CM | POA: Diagnosis not present

## 2022-01-06 ENCOUNTER — Other Ambulatory Visit: Payer: Self-pay | Admitting: Urology

## 2022-01-07 DIAGNOSIS — Z992 Dependence on renal dialysis: Secondary | ICD-10-CM | POA: Diagnosis not present

## 2022-01-07 DIAGNOSIS — N186 End stage renal disease: Secondary | ICD-10-CM | POA: Diagnosis not present

## 2022-01-07 DIAGNOSIS — N2581 Secondary hyperparathyroidism of renal origin: Secondary | ICD-10-CM | POA: Diagnosis not present

## 2022-01-09 DIAGNOSIS — N186 End stage renal disease: Secondary | ICD-10-CM | POA: Diagnosis not present

## 2022-01-09 DIAGNOSIS — N2581 Secondary hyperparathyroidism of renal origin: Secondary | ICD-10-CM | POA: Diagnosis not present

## 2022-01-09 DIAGNOSIS — Z992 Dependence on renal dialysis: Secondary | ICD-10-CM | POA: Diagnosis not present

## 2022-01-11 ENCOUNTER — Ambulatory Visit: Payer: Medicare HMO | Admitting: Urology

## 2022-01-12 DIAGNOSIS — Z7901 Long term (current) use of anticoagulants: Secondary | ICD-10-CM | POA: Diagnosis not present

## 2022-01-12 DIAGNOSIS — N186 End stage renal disease: Secondary | ICD-10-CM | POA: Diagnosis not present

## 2022-01-12 DIAGNOSIS — Z992 Dependence on renal dialysis: Secondary | ICD-10-CM | POA: Diagnosis not present

## 2022-01-12 DIAGNOSIS — N2581 Secondary hyperparathyroidism of renal origin: Secondary | ICD-10-CM | POA: Diagnosis not present

## 2022-01-12 DIAGNOSIS — Z5181 Encounter for therapeutic drug level monitoring: Secondary | ICD-10-CM | POA: Diagnosis not present

## 2022-01-13 ENCOUNTER — Telehealth: Payer: Self-pay | Admitting: Urology

## 2022-01-13 NOTE — Telephone Encounter (Signed)
Paverine- Papentolamine 30 mg ? ?[facility] ? ?(His Rx expired) Has an appt is on Monday 01/18/22 ? ? ?Viagra ? ?[facility] [address] ?

## 2022-01-13 NOTE — Telephone Encounter (Signed)
Notified pt [name] will refill meds at his upcoming yearly follow up next week if appropriate. Gave pt appt date and time, he confirmed and expressed understanding.  ?

## 2022-01-14 DIAGNOSIS — N186 End stage renal disease: Secondary | ICD-10-CM | POA: Diagnosis not present

## 2022-01-14 DIAGNOSIS — N2581 Secondary hyperparathyroidism of renal origin: Secondary | ICD-10-CM | POA: Diagnosis not present

## 2022-01-14 DIAGNOSIS — Z992 Dependence on renal dialysis: Secondary | ICD-10-CM | POA: Diagnosis not present

## 2022-01-16 DIAGNOSIS — N186 End stage renal disease: Secondary | ICD-10-CM | POA: Diagnosis not present

## 2022-01-16 DIAGNOSIS — Z992 Dependence on renal dialysis: Secondary | ICD-10-CM | POA: Diagnosis not present

## 2022-01-16 DIAGNOSIS — N2581 Secondary hyperparathyroidism of renal origin: Secondary | ICD-10-CM | POA: Diagnosis not present

## 2022-01-18 ENCOUNTER — Ambulatory Visit (INDEPENDENT_AMBULATORY_CARE_PROVIDER_SITE_OTHER): Payer: Medicare HMO | Admitting: Urology

## 2022-01-18 ENCOUNTER — Encounter: Payer: Self-pay | Admitting: Urology

## 2022-01-18 VITALS — BP 163/77 | HR 74 | Ht 76.0 in | Wt >= 6400 oz

## 2022-01-18 DIAGNOSIS — E291 Testicular hypofunction: Secondary | ICD-10-CM

## 2022-01-18 DIAGNOSIS — N529 Male erectile dysfunction, unspecified: Secondary | ICD-10-CM

## 2022-01-18 DIAGNOSIS — N400 Enlarged prostate without lower urinary tract symptoms: Secondary | ICD-10-CM

## 2022-01-18 MED ORDER — SILDENAFIL CITRATE 50 MG PO TABS: 50.0000 mg | ORAL_TABLET | Freq: Every day | ORAL | 0 refills | Status: DC | PRN

## 2022-01-18 NOTE — Progress Notes (Signed)
01/19/22 9:32 AM   [name] 1971/05/05 [id number]  Referring provider:  [name], MD [address],  [place] [address]   Urological history:  1. Increased prolactin  - pituitary MRI 04/2020 6 mm focus of transient hypoenhancement within the inferior right aspect of the pituitary gland. This finding is suspicious for a pituitary microadenoma given the provided history. Additionally, there is subtle leftward deviation of the pituitary stalk.  - followed by endocrinology, last seen 11/2021     2. Testosterone deficiency  - testosterone level 294, 07/2021  - followed by endocrinology     3. ED  - contributing factors of age, testosterone deficiency, DM, HTN, sleep apnea, history of smoking and ESRD  - SHIM  - failed PDE5i's  - managed with ICI     4. BPH  -PSA 0.58, 07/2021     5. Sleep apnea  -untreated    Chief Complaint  Patient presents with   Follow-up    1 year follow-up     HPI: [name] is a 51 y.o.male who presents today for annual follow-up with SHIM.   He reports today that he has been followed by endocrinology for his pituitary gland and hypogonadism.   His TRT is still on hold at this time.  He reports some penile fullness with the sildenafil 25 mg, but he would like to increase the dose.  Patient is not having spontaneous erections.   He denies any pain or curvature with erections.     SHIM     Row Name 01/18/22 0954         SHIM: Over the last 6 months:   How do you rate your confidence that you could get and keep an erection? Very Low     When you had erections with sexual stimulation, how often were your erections hard enough for penetration (entering your partner)? Almost Never or Never     During sexual intercourse, how often were you able to maintain your erection after you had penetrated (entered) your partner? A Few Times (much less than half the time)     During sexual intercourse, how difficult was it to maintain your  erection to completion of intercourse? Very Difficult     When you attempted sexual intercourse, how often was it satisfactory for you? A Few Times (much less than half the time)       SHIM Total Score   SHIM 8                PMH: Past Medical History:  Diagnosis Date   Anemia    Chronic kidney disease    14% FUNCTION   Diabetes mellitus without complication (HCC)    Diabetic retinopathy ([place])    Dyspnea    DOE   Dysrhythmia    End stage renal disease ([place])    History of orthopnea    error wrong patietn   Hypercholesteremia    error   Hyperkalemia    error wrong patient   Hypertension    Long-term insulin use (HCC)    Lymphedema of leg    Metabolic encephalopathy    error   Microalbuminuria    MSSA (methicillin susceptible Staphylococcus aureus)    Obesity    PVD (peripheral vascular disease) ([place])    Respiratory failure (HCC)    Error   Sepsis ([facility])    error   Sleep apnea    no CPAP   UTI (urinary tract infection)  error   Venous ulcer ([place])    Vitamin D deficiency     Surgical History: Past Surgical History:  Procedure Laterality Date   A/V FISTULAGRAM Right 10/24/2018   Procedure: A/V FISTULAGRAM;  Surgeon: Katha Cabal, MD;  Location: [facility];  Service: Cardiovascular;  Laterality: Right;   A/V FISTULAGRAM Right 11/24/2018   Procedure: A/V FISTULAGRAM;  Surgeon: Katha Cabal, MD;  Location: [facility];  Service: Cardiovascular;  Laterality: Right;   A/V FISTULAGRAM Left 07/31/2019   Procedure: A/V FISTULAGRAM;  Surgeon: Katha Cabal, MD;  Location: [facility];  Service: Cardiovascular;  Laterality: Left;   A/V FISTULAGRAM Left 12/11/2019   Procedure: A/V FISTULAGRAM;  Surgeon: Katha Cabal, MD;  Location: [facility];  Service: Cardiovascular;  Laterality: Left;   A/V SHUNT INTERVENTION Right 02/21/2019   Procedure: A/V SHUNT INTERVENTION;  Surgeon: Katha Cabal, MD;  Location:  [facility];  Service: Cardiovascular;  Laterality: Right;   APPLICATION OF WOUND VAC Left 10/03/2017   Procedure: APPLICATION OF WOUND VAC;  Surgeon: Algernon Huxley, MD;  Location: [facility];  Service: General;  Laterality: Left;   APPLICATION OF WOUND VAC Left 10/11/2017   Procedure: APPLICATION OF WOUND VAC;  Surgeon: Katha Cabal, MD;  Location: [facility];  Service: Vascular;  Laterality: Left;   APPLICATION OF WOUND VAC Left 10/14/2017   Procedure: WOUND VAC CHANGE;  Surgeon: Katha Cabal, MD;  Location: [facility];  Service: Vascular;  Laterality: Left;  left lower leg   APPLICATION OF WOUND VAC Left 10/18/2017   Procedure: WOUND VAC CHANGE;  Surgeon: Katha Cabal, MD;  Location: [facility];  Service: Vascular;  Laterality: Left;   APPLICATION OF WOUND VAC Left 10/07/2017   Procedure: APPLICATION OF WOUND VAC;  Surgeon: Katha Cabal, MD;  Location: [facility];  Service: Vascular;  Laterality: Left;   AV FISTULA INSERTION W/ RF MAGNETIC GUIDANCE Right 07/19/2018   Procedure: AV FISTULA INSERTION W/RF MAGNETIC GUIDANCE;  Surgeon: Katha Cabal, MD;  Location: [facility];  Service: Cardiovascular;  Laterality: Right;   AV FISTULA PLACEMENT Left 05/11/2019   Procedure: ARTERIOVENOUS (AV) FISTULA CREATION (RADIOCEPHALIC );  Surgeon: Katha Cabal, MD;  Location: [facility];  Service: Vascular;  Laterality: Left;   AV FISTULA PLACEMENT Left 02/20/2020   Procedure: INSERTION OF ARTERIOVENOUS (AV) GORE-TEX GRAFT LEFT ARM ( FOREARM LOOP );  Surgeon: Katha Cabal, MD;  Location: [facility];  Service: Vascular;  Laterality: Left;   CATARACT EXTRACTION W/PHACO Left 11/01/2018   Procedure: CATARACT EXTRACTION PHACO AND INTRAOCULAR LENS PLACEMENT (IOC)-LEFT, DIABETIC-INSULIN DEPENDENT;  Surgeon: Eulogio Bear, MD;  Location: [facility];  Service: Ophthalmology;  Laterality: Left;  [place] 00:41.8 CDE 4.61 Fluid Pack Lot # [id number]   CATARACT EXTRACTION W/PHACO Right  04/27/2019   Procedure: CATARACT EXTRACTION PHACO AND INTRAOCULAR LENS PLACEMENT (IOC);  Surgeon: Eulogio Bear, MD;  Location: [facility];  Service: Ophthalmology;  Laterality: Right;  [place] 00:34 CDE 1.97 FLUID PACK LOT # [id number]    COLONOSCOPY WITH PROPOFOL N/A 05/12/2020   Procedure: COLONOSCOPY WITH PROPOFOL;  Surgeon: Toledo, Benay Pike, MD;  Location: [facility];  Service: Gastroenterology;  Laterality: N/A;   DIALYSIS/PERMA CATHETER INSERTION N/A 02/23/2019   Procedure: DIALYSIS/PERMA CATHETER INSERTION;  Surgeon: Algernon Huxley, MD;  Location: [facility];  Service: Cardiovascular;  Laterality: N/A;   DIALYSIS/PERMA CATHETER REMOVAL N/A 04/03/2020   Procedure:  DIALYSIS/PERMA CATHETER REMOVAL;  Surgeon: Katha Cabal, MD;  Location: [facility];  Service: Cardiovascular;  Laterality: N/A;   I & D EXTREMITY Left 10/11/2017   Procedure: IRRIGATION AND DEBRIDEMENT EXTREMITY;  Surgeon: Katha Cabal, MD;  Location: [facility];  Service: Vascular;  Laterality: Left;   INCISION AND DRAINAGE ABSCESS Right 10/07/2017   Procedure: INCISION AND DRAINAGE ABSCESS;  Surgeon: Katha Cabal, MD;  Location: [facility];  Service: Vascular;  Laterality: Right;   IRRIGATION AND DEBRIDEMENT ABSCESS Left 10/03/2017   Procedure: IRRIGATION AND DEBRIDEMENT ABSCESS with debridement of skin, soft tissue, muscle 50sq cm;  Surgeon: Algernon Huxley, MD;  Location: [facility];  Service: General;  Laterality: Left;   TEMPORARY DIALYSIS CATHETER  02/20/2019   Procedure: TEMPORARY DIALYSIS CATHETER;  Surgeon: Katha Cabal, MD;  Location: [facility];  Service: Cardiovascular;;   UPPER EXTREMITY ANGIOGRAPHY Left 09/18/2019   Procedure: UPPER EXTREMITY ANGIOGRAPHY;  Surgeon: Katha Cabal, MD;  Location: [facility];  Service: Cardiovascular;  Laterality: Left;    Home Medications:  Allergies as of 01/18/2022       Reactions   Lisinopril Swelling   Lips mouth     Furosemide Cough   Light headed, blurry vision, weakness.   Latex    Adhesive [tape] Itching, Rash        Medication List        Accurate as of Jan 18, 2022 11:59 PM. If you have any questions, ask your nurse or doctor.          STOP taking these medications    ascorbic acid 500 MG tablet Commonly known as: VITAMIN C Stopped by: Shronda Boeh, PA-C       TAKE these medications    allopurinol 100 MG tablet Commonly known as: ZYLOPRIM Take 100 mg by mouth daily.   amLODipine 10 MG tablet Commonly known as: NORVASC Take 5 mg by mouth daily as needed (if bp is 190/90 or higher).   atenolol 50 MG tablet Commonly known as: TENORMIN Take 25 mg by mouth daily as needed (if bp is 190/90 or higher).   bromocriptine 2.5 MG tablet Commonly known as: PARLODEL TAKE 1 TABLET BY MOUTH AT BEDTIME.   calcium acetate 667 MG capsule Commonly known as: PHOSLO Take 1,334-2,001 mg by mouth See admin instructions. Take 2001 mg by mouth with each meal & take 1334 mg by mouth with each snack.   cinacalcet 30 MG tablet Commonly known as: SENSIPAR Take 30 mg by mouth daily.   CINNAMON PO Take 500 mg by mouth 2 (two) times daily.   DOCOSAHEXAENOIC ACID-EPA PO Take by mouth.   EYE HEALTH PO Take 1 capsule by mouth daily.   GaviLyte-C 240 g solution Generic drug: polyethylene glycol Take by mouth as directed.   ibuprofen 200 MG tablet Commonly known as: ADVIL Take 200 mg by mouth every 6 (six) hours as needed for headache or moderate pain.   Lantus SoloStar 100 UNIT/ML Solostar Pen Generic drug: insulin glargine Inject 15 Units into the skin at bedtime.   lidocaine-prilocaine cream Commonly known as: EMLA Apply 1 application topically as needed (port access).   loratadine 10 MG tablet Commonly known as: CLARITIN Take 10 mg by mouth daily as needed for allergies.   melatonin 5 MG Tabs Take 5 mg by mouth at bedtime as needed (sleep).   metolazone 5 MG  tablet Commonly known as: ZAROXOLYN Take 5 mg by mouth every  Monday, Wednesday, and Friday.   multivitamin with minerals Tabs tablet Take 1 tablet by mouth 3 (three) times a week. Men's Multivitamin   NONFORMULARY OR COMPOUNDED ITEM Trimix (30/1/10)-(Pap/Phent/PGE)  Test Dose  79m vial   Qty #3 [phone] [phone]  OMEGA-3-6-9 PO Take 1 capsule by mouth 3 (three) times a week.   Rena-Vite Rx 1 MG Tabs Take 1 tablet by mouth 3 (three) times a week.   sildenafil 50 MG tablet Commonly known as: VIAGRA Take 1 tablet (50 mg total) by mouth daily as needed for erectile dysfunction. What changed:  medication strength how much to take Changed by: Dreden Rivere, PA-C   SYSTANE OP Place 1 drop into both eyes daily as needed (dry eyes).   warfarin 5 MG tablet Commonly known as: COUMADIN Take 10 mg by mouth daily.        Allergies:  Allergies  Allergen Reactions   Lisinopril Swelling    Lips mouth    Furosemide Cough    Light headed, blurry vision, weakness.   Latex    Adhesive [Tape] Itching and Rash    Family History: Family History  Problem Relation Age of Onset   Diabetes Father    Kidney disease Father    Diabetes Daughter     Social History:  reports that he has quit smoking. His smoking use included cigars. He has been exposed to tobacco smoke. He quit smokeless tobacco use about 4 years ago. He reports current alcohol use of about 1.0 standard drink per week. He reports that he does not currently use drugs.   Physical Exam: BP (!) 163/77   Pulse 74   Ht '6\' 4"'$  (1.93 m)   Wt (!) 404 lb (183.3 kg)   BMI 49.18 kg/m   Constitutional:  Alert and oriented, No acute distress. HEENT: [place] AT, moist mucus membranes.  Trachea midline Cardiovascular: No clubbing, cyanosis, or edema. Respiratory: Normal respiratory effort, no increased work of breathing. Neurologic: Grossly intact, no focal deficits, moving all 4  extremities. Psychiatric: Normal mood and affect.   Laboratory Data: Component     Latest Ref Rng 08/05/2021  FSH     1.4 - 18.1 mIU/ML 4.0     Component     Latest Ref Rng 08/05/2021  LH     1.50 - 9.30 mIU/mL 8.81     Component     Latest Ref Rng 08/05/2021  PSA     0.10 - 4.00 ng/mL 0.58     Component     Latest Ref Rng 08/05/2021  Testosterone     264 - 916 ng/dL 294   Testosterone Free     7.2 - 24.0 pg/mL 16.1    Component     Latest Ref Rng 08/05/2021  TSH     0.35 - 5.50 uIU/mL 2.32    Component     Latest Ref Rng 12/21/2021  Prolactin     2.0 - 18.0 ng/mL 9.1   I have reviewed the labs.   Assessment & Plan:   ED  -He would like to try an increased dose of the sildenafil -Sildenafil 50 mg is called into his pharmacy, advised him of the side effects -He will contact me regarding its effectiveness after he has had 4-5 trials of the sildenafil 50 mg  2. Testosterone deficiency - Treatment on hold labs are WNL.  -followed by endocrinology   3. Increased prolactin - followed by endocrinology  Return  in about 1 year (around 01/19/2023) for SHIM .  [facility] [address], [place], [place] [address] [phone]  I, Kirke Shaggy Littlejohn,acting as a scribe for [facility], PA-C.,have documented all relevant documentation on the behalf of [name] Rosiak, PA-C,as directed by  [facility], PA-C while in the presence of [place], PA-C.  I have reviewed the above documentation for accuracy and completeness, and I agree with the above.    [name] Council, PA-C

## 2022-01-19 DIAGNOSIS — Z992 Dependence on renal dialysis: Secondary | ICD-10-CM | POA: Diagnosis not present

## 2022-01-19 DIAGNOSIS — N186 End stage renal disease: Secondary | ICD-10-CM | POA: Diagnosis not present

## 2022-01-19 DIAGNOSIS — N2581 Secondary hyperparathyroidism of renal origin: Secondary | ICD-10-CM | POA: Diagnosis not present

## 2022-01-20 ENCOUNTER — Other Ambulatory Visit: Payer: Self-pay | Admitting: Internal Medicine

## 2022-01-20 ENCOUNTER — Other Ambulatory Visit
Admission: RE | Admit: 2022-01-20 | Discharge: 2022-01-20 | Disposition: A | Discharge status: Home / Self Care | Payer: Medicare HMO | Source: Ambulatory Visit | Attending: Internal Medicine | Admitting: Internal Medicine

## 2022-01-20 ENCOUNTER — Encounter: Payer: Medicare HMO | Attending: Internal Medicine | Admitting: Internal Medicine

## 2022-01-20 DIAGNOSIS — X58XXXA Exposure to other specified factors, initial encounter: Secondary | ICD-10-CM | POA: Insufficient documentation

## 2022-01-20 DIAGNOSIS — S21101A Unspecified open wound of right front wall of thorax without penetration into thoracic cavity, initial encounter: Secondary | ICD-10-CM | POA: Diagnosis not present

## 2022-01-20 DIAGNOSIS — E10622 Type 1 diabetes mellitus with other skin ulcer: Secondary | ICD-10-CM

## 2022-01-20 DIAGNOSIS — B954 Other streptococcus as the cause of diseases classified elsewhere: Secondary | ICD-10-CM | POA: Insufficient documentation

## 2022-01-20 DIAGNOSIS — Z992 Dependence on renal dialysis: Secondary | ICD-10-CM | POA: Insufficient documentation

## 2022-01-20 DIAGNOSIS — E1040 Type 1 diabetes mellitus with diabetic neuropathy, unspecified: Secondary | ICD-10-CM | POA: Insufficient documentation

## 2022-01-20 DIAGNOSIS — S1180XA Unspecified open wound of other specified part of neck, initial encounter: Secondary | ICD-10-CM

## 2022-01-20 DIAGNOSIS — N186 End stage renal disease: Secondary | ICD-10-CM | POA: Insufficient documentation

## 2022-01-20 DIAGNOSIS — I120 Hypertensive chronic kidney disease with stage 5 chronic kidney disease or end stage renal disease: Secondary | ICD-10-CM | POA: Insufficient documentation

## 2022-01-20 DIAGNOSIS — E1022 Type 1 diabetes mellitus with diabetic chronic kidney disease: Secondary | ICD-10-CM | POA: Insufficient documentation

## 2022-01-20 DIAGNOSIS — E1039 Type 1 diabetes mellitus with other diabetic ophthalmic complication: Secondary | ICD-10-CM | POA: Insufficient documentation

## 2022-01-21 DIAGNOSIS — Z992 Dependence on renal dialysis: Secondary | ICD-10-CM | POA: Diagnosis not present

## 2022-01-21 DIAGNOSIS — Z5181 Encounter for therapeutic drug level monitoring: Secondary | ICD-10-CM | POA: Diagnosis not present

## 2022-01-21 DIAGNOSIS — Z7901 Long term (current) use of anticoagulants: Secondary | ICD-10-CM | POA: Diagnosis not present

## 2022-01-21 DIAGNOSIS — N2581 Secondary hyperparathyroidism of renal origin: Secondary | ICD-10-CM | POA: Diagnosis not present

## 2022-01-21 DIAGNOSIS — N186 End stage renal disease: Secondary | ICD-10-CM | POA: Diagnosis not present

## 2022-01-21 NOTE — Progress Notes (Signed)
DAREAN, ROTE ([id number]) Visit Report for 01/20/2022 Allergy List Details Patient Name: [name], [name] Date of Service: 01/20/2022 8:45 AM Medical Record Number: [id number] Patient Account Number: [id number] Date of Birth/Sex: 02-09-1971 (51 y.o. M) Treating RN: Carlene Coria Primary Care Nesha Counihan: Thereasa Distance Other Clinician: Referring Jakson Delpilar: Thereasa Distance Treating Keylen Uzelac/Extender: Yaakov Guthrie in Treatment: 0 Allergies Active Allergies lisinopril latex tape, occlusive adhesive Allergy Notes Electronic Signature(s) Signed: 01/21/2022 2:19:12 PM By: Carlene Coria RN Entered By: Carlene Coria on 01/20/2022 08:57:28 Setzer, Jaimon A. ([id number]) -------------------------------------------------------------------------------- Arrival Information Details Patient Name: [name], [name] Date of Service: 01/20/2022 8:45 AM Medical Record Number: [id number] Patient Account Number: [id number] Date of Birth/Sex: 1971/02/21 (51 y.o. M) Treating RN: Carlene Coria Primary Care Chryl Holten: Thereasa Distance Other Clinician: Referring Simara Rhyner: Thereasa Distance Treating Tilly Pernice/Extender: Yaakov Guthrie in Treatment: 0 Visit Information Patient Arrived: Ambulatory Arrival Time: 08:49 Accompanied By: self Transfer Assistance: None Patient Identification Verified: Yes Secondary Verification Process Completed: Yes Patient Requires Transmission-Based No Precautions: Patient Has Alerts: Yes Patient Alerts: Patient on Blood Thinner DIABETIC History Since Last Visit All ordered tests and consults were completed: No Added or deleted any medications: No Any new allergies or adverse reactions: No Had a fall or experienced change in activities of daily living that may affect risk of falls: No Signs or symptoms of abuse/neglect since last visito No Hospitalized since last visit: No Implantable device outside of the clinic excluding cellular tissue based products placed in  the center since last visit: No Electronic Signature(s) Signed: 01/21/2022 2:19:12 PM By: Carlene Coria RN Entered By: Carlene Coria on 01/20/2022 08:51:28 [name], [name] ([id number]) -------------------------------------------------------------------------------- Clinic Level of Care Assessment Details Patient Name: [name], [name] Date of Service: 01/20/2022 8:45 AM Medical Record Number: [id number] Patient Account Number: [id number] Date of Birth/Sex: April 19, 1971 (51 y.o. M) Treating RN: Carlene Coria Primary Care Rosamaria Donn: Thereasa Distance Other Clinician: Referring Kessler Solly: Thereasa Distance Treating Maguadalupe Lata/Extender: Yaakov Guthrie in Treatment: 0 Clinic Level of Care Assessment Items TOOL 1 Quantity Score X - Use when EandM and Procedure is performed on INITIAL visit 1 0 ASSESSMENTS - Nursing Assessment / Reassessment X - General Physical Exam (combine w/ comprehensive assessment (listed just below) when performed on new 1 20 pt. evals) X- 1 25 Comprehensive Assessment (HX, ROS, Risk Assessments, Wounds Hx, etc.) ASSESSMENTS - Wound and Skin Assessment / Reassessment '[]'$  - Dermatologic / Skin Assessment (not related to wound area) 0 ASSESSMENTS - Ostomy and/or Continence Assessment and Care '[]'$  - Incontinence Assessment and Management 0 '[]'$  - 0 Ostomy Care Assessment and Management (repouching, etc.) PROCESS - Coordination of Care X - Simple Patient / Family Education for ongoing care 1 15 '[]'$  - 0 Complex (extensive) Patient / Family Education for ongoing care X- 1 10 Staff obtains Programmer, systems, Records, Test Results / Process Orders '[]'$  - 0 Staff telephones HHA, Nursing Homes / Clarify orders / etc '[]'$  - 0 Routine Transfer to another Facility (non-emergent condition) '[]'$  - 0 Routine Hospital Admission (non-emergent condition) X- 1 15 New Admissions / Biomedical engineer / Ordering NPWT, Apligraf, etc. '[]'$  - 0 Emergency Hospital Admission (emergent condition) PROCESS -  Special Needs '[]'$  - Pediatric / Minor Patient Management 0 '[]'$  - 0 Isolation Patient Management '[]'$  - 0 Hearing / Language / Visual special needs '[]'$  - 0 Assessment of Community assistance (transportation, D/C planning, etc.) '[]'$  - 0 Additional assistance / Altered mentation '[]'$  - 0 Support Surface(s) Assessment (bed, cushion, seat, etc.) INTERVENTIONS - Miscellaneous '[]'$  -  External ear exam 0 '[]'$  - 0 Patient Transfer (multiple staff / Civil Service fast streamer / Similar devices) '[]'$  - 0 Simple Staple / Suture removal (25 or less) '[]'$  - 0 Complex Staple / Suture removal (26 or more) '[]'$  - 0 Hypo/Hyperglycemic Management (do not check if billed separately) '[]'$  - 0 Ankle / Brachial Index (ABI) - do not check if billed separately Has the patient been seen at the hospital within the last three years: Yes Total Score: 85 Level Of Care: New/Established - Level 3 Galati, Korion A. ([id number]) Electronic Signature(s) Signed: 01/21/2022 2:19:12 PM By: Carlene Coria RN Entered By: Carlene Coria on 01/20/2022 09:37:51 [name], [name] ([id number]) -------------------------------------------------------------------------------- Encounter Discharge Information Details Patient Name: [name], [name] Date of Service: 01/20/2022 8:45 AM Medical Record Number: [id number] Patient Account Number: [id number] Date of Birth/Sex: 1971-03-09 (51 y.o. M) Treating RN: Carlene Coria Primary Care Earleen Aoun: Thereasa Distance Other Clinician: Referring Alyannah Sanks: Thereasa Distance Treating Shuntavia Yerby/Extender: Yaakov Guthrie in Treatment: 0 Encounter Discharge Information Items Discharge Condition: Stable Ambulatory Status: Ambulatory Discharge Destination: Home Transportation: Private Auto Accompanied By: self Schedule Follow-up Appointment: Yes Clinical Summary of Care: Electronic Signature(s) Signed: 01/21/2022 2:19:12 PM By: Carlene Coria RN Entered By: Carlene Coria on 01/20/2022 09:44:50 [name], [name]  ([id number]) -------------------------------------------------------------------------------- Lower Extremity Assessment Details Patient Name: [name], [name] Date of Service: 01/20/2022 8:45 AM Medical Record Number: [id number] Patient Account Number: [id number] Date of Birth/Sex: 1971/04/02 (51 y.o. M) Treating RN: Carlene Coria Primary Care Khaalid Lefkowitz: Thereasa Distance Other Clinician: Referring Kimmora Risenhoover: Thereasa Distance Treating Yitzhak [name]/Extender: Yaakov Guthrie in Treatment: 0 Electronic Signature(s) Signed: 01/21/2022 2:19:12 PM By: Carlene Coria RN Entered By: Carlene Coria on 01/20/2022 08:56:49 [name], [name] ([id number]) -------------------------------------------------------------------------------- Multi Wound Chart Details Patient Name: [name], [name] Date of Service: 01/20/2022 8:45 AM Medical Record Number: [id number] Patient Account Number: [id number] Date of Birth/Sex: [birth year] (51 y.o. M) Treating RN: Carlene Coria Primary Care Uyen Eichholz: Thereasa Distance Other Clinician: Referring Jayion Schneck: Thereasa Distance Treating Darold Miley/Extender: Yaakov Guthrie in Treatment: 0 Vital Signs Height(in): 76 Pulse(bpm): 37 Weight(lbs): 404 Blood Pressure(mmHg): 151/90 Body Mass Index(BMI): 49.2 Temperature(F): 98.3 Respiratory Rate(breaths/min): 18 Photos: [N/A:N/A] Wound Location: Right Chest N/A N/A Wounding Event: Surgical Injury N/A N/A Primary Etiology: Dehisced Wound N/A N/A Comorbid History: Hypertension, Type II Diabetes, N/A N/A Neuropathy Date Acquired: 08/30/2020 N/A N/A Weeks of Treatment: 0 N/A N/A Wound Status: Open N/A N/A Wound Recurrence: No N/A N/A Measurements L x W x D (cm) 1x0.5x1.4 N/A N/A Area (cm) : 0.393 N/A N/A Volume (cm) : 0.55 N/A N/A Classification: Full Thickness Without Exposed N/A N/A Support Structures Exudate Amount: Medium N/A N/A Exudate Type: Serosanguineous N/A N/A Exudate Color: red, brown N/A N/A Granulation  Amount: Large (67-100%) N/A N/A Granulation Quality: Red N/A N/A Necrotic Amount: Small (1-33%) N/A N/A Exposed Structures: Fascia: No N/A N/A Fat Layer (Subcutaneous Tissue): No Tendon: No Muscle: No Joint: No Bone: No Epithelialization: None N/A N/A Procedures Performed: CHEM CAUT GRANULATION TISS N/A N/A Treatment Notes Wound #19 (Chest) Wound Laterality: Right Cleanser Soap and Water Discharge Instruction: Gently cleanse wound with antibacterial soap, rinse and pat dry prior to dressing wounds Peri-Wound Care Topical Mcbrearty, Ashutosh A. ([id number]) Mupirocin Ointment Discharge Instruction: Apply as directed by Caidyn Blossom. Primary Dressing Hydrofera Blue Ready Transfer Foam, 4x5 (in/in) Discharge Instruction: Apply Hydrofera Blue Ready to wound bed as directed Secondary Dressing Coverlet Latex-Free Fabric Adhesive Dressings Discharge Instruction: 1.5 x 2 Secured With Compression Wrap Compression Stockings Add-Ons Electronic Signature(s) Signed: 01/20/2022 11:08:22  AM By: Kalman Shan DO Entered By: Kalman Shan on 01/20/2022 10:19:56 Mcphie, Gid AMarland Kitchen ([id number]) -------------------------------------------------------------------------------- Multi-Disciplinary Care Plan Details Patient Name: [name], [name] Date of Service: 01/20/2022 8:45 AM Medical Record Number: [id number] Patient Account Number: [id number] Date of Birth/Sex: Sep 17, 1970 (51 y.o. M) Treating RN: Carlene Coria Primary Care Dioselina Brumbaugh: Thereasa Distance Other Clinician: Referring Kattleya Kuhnert: Thereasa Distance Treating Carie Kapuscinski/Extender: Yaakov Guthrie in Treatment: 0 Active Inactive Wound/Skin Impairment Nursing Diagnoses: Knowledge deficit related to ulceration/compromised skin integrity Goals: Patient/caregiver will verbalize understanding of skin care regimen Date Initiated: 01/20/2022 Target Resolution Date: 02/20/2022 Goal Status: Active Ulcer/skin breakdown will have a volume reduction of  30% by week 4 Date Initiated: 01/20/2022 Target Resolution Date: 02/20/2022 Goal Status: Active Ulcer/skin breakdown will have a volume reduction of 50% by week 8 Date Initiated: 01/20/2022 Target Resolution Date: 03/22/2022 Goal Status: Active Ulcer/skin breakdown will have a volume reduction of 80% by week 12 Date Initiated: 01/20/2022 Target Resolution Date: 04/22/2022 Goal Status: Active Ulcer/skin breakdown will heal within 14 weeks Date Initiated: 01/20/2022 Target Resolution Date: 04/22/2022 Goal Status: Active Interventions: Assess patient/caregiver ability to obtain necessary supplies Assess patient/caregiver ability to perform ulcer/skin care regimen upon admission and as needed Assess ulceration(s) every visit Notes: Electronic Signature(s) Signed: 01/21/2022 2:19:12 PM By: Carlene Coria RN Entered By: Carlene Coria on 01/20/2022 09:32:46 Shanholtzer, Rilen A. ([id number]) -------------------------------------------------------------------------------- Pain Assessment Details Patient Name: [name], [name] Date of Service: 01/20/2022 8:45 AM Medical Record Number: [id number] Patient Account Number: [id number] Date of Birth/Sex: 1970-11-30 (51 y.o. M) Treating RN: Carlene Coria Primary Care Dillon Livermore: Thereasa Distance Other Clinician: Referring Allyse Fregeau: Thereasa Distance Treating Penny Frisbie/Extender: Yaakov Guthrie in Treatment: 0 Active Problems Location of Pain Severity and Description of Pain Patient Has Paino No Site Locations Pain Management and Medication Current Pain Management: Electronic Signature(s) Signed: 01/21/2022 2:19:12 PM By: Carlene Coria RN Entered By: Carlene Coria on 01/20/2022 08:51:35 [name], [name] ([id number]) -------------------------------------------------------------------------------- Patient/Caregiver Education Details Patient Name: Willhelm, Aurelius A. Date of Service: 01/20/2022 8:45 AM Medical Record Number: [id number] Patient Account Number:  [id number] Date of Birth/Gender: 23-May-1971 (51 y.o. M) Treating RN: Carlene Coria Primary Care Physician: Thereasa Distance Other Clinician: Referring Physician: Thereasa Distance Treating Physician/Extender: Yaakov Guthrie in Treatment: 0 Education Assessment Education Provided To: Patient Education Topics Provided Wound/Skin Impairment: Methods: Explain/Verbal Responses: State content correctly Electronic Signature(s) Signed: 01/21/2022 2:19:12 PM By: Carlene Coria RN Entered By: Carlene Coria on 01/20/2022 09:38:07 Pola, Baruc A. ([id number]) -------------------------------------------------------------------------------- Wound Assessment Details Patient Name: Hazen, Suhail A. Date of Service: 01/20/2022 8:45 AM Medical Record Number: [id number] Patient Account Number: [id number] Date of Birth/Sex: 1971/07/14 (51 y.o. M) Treating RN: Carlene Coria Primary Care Coreena Rubalcava: Thereasa Distance Other Clinician: Referring Sarahgrace Broman: Thereasa Distance Treating Toriano Aikey/Extender: Yaakov Guthrie in Treatment: 0 Wound Status Wound Number: 19 Primary Etiology: Dehisced Wound Wound Location: Right Chest Wound Status: Open Wounding Event: Surgical Injury Comorbid History: Hypertension, Type II Diabetes, Neuropathy Date Acquired: 08/30/2020 Weeks Of Treatment: 0 Clustered Wound: No Photos Wound Measurements Length: (cm) 1 Width: (cm) 0.5 Depth: (cm) 1.4 Area: (cm) 0.393 Volume: (cm) 0.55 % Reduction in Area: % Reduction in Volume: Epithelialization: None Tunneling: No Undermining: No Wound Description Classification: Full Thickness Without Exposed Support Structu Exudate Amount: Medium Exudate Type: Serosanguineous Exudate Color: red, brown res Foul Odor After Cleansing: No Slough/Fibrino Yes Wound Bed Granulation Amount: Large (67-100%) Exposed Structure Granulation Quality: Red Fascia Exposed: No Necrotic Amount: Small (1-33%) Fat Layer (Subcutaneous Tissue)  Exposed: No Necrotic Quality: Adherent Slough  Tendon Exposed: No Muscle Exposed: No Joint Exposed: No Bone Exposed: No Electronic Signature(s) Signed: 01/21/2022 2:19:12 PM By: Carlene Coria RN Entered By: Carlene Coria on 01/20/2022 [place], Crespin A. ([id number]) -------------------------------------------------------------------------------- [place] Details Patient Name: Camposano, Murray A. Date of Service: 01/20/2022 8:45 AM Medical Record Number: [id number] Patient Account Number: [id number] Date of Birth/Sex: December 18, 1970 (51 y.o. M) Treating RN: Carlene Coria Primary Care Myran Arcia: Thereasa Distance Other Clinician: Referring Lennart Gladish: Thereasa Distance Treating Alira Fretwell/Extender: Yaakov Guthrie in Treatment: 0 Vital Signs Time Taken: 08:51 Temperature (F): 98.3 Height (in): 76 Pulse (bpm): 79 Source: Stated Respiratory Rate (breaths/min): 18 Weight (lbs): 404 Blood Pressure (mmHg): 151/90 Source: Stated Reference Range: 80 - 120 mg / dl Body Mass Index (BMI): 49.2 Electronic Signature(s) Signed: 01/21/2022 2:19:12 PM By: Carlene Coria RN Entered By: Carlene Coria on 01/20/2022 08:53:19

## 2022-01-21 NOTE — Progress Notes (Signed)
RHONIN, TROTT ([id number]) Visit Report for 01/20/2022 Abuse Risk Screen Details Patient Name: [name], [name] Date of Service: 01/20/2022 8:45 AM Medical Record Number: [id number] Patient Account Number: [id number] Date of Birth/Sex: [birth year] (51 y.o. M) Treating RN: Carlene Coria Primary Care Korey Prashad: Thereasa Distance Other Clinician: Referring Soni Kegel: Thereasa Distance Treating Ilya Ess/Extender: Yaakov Guthrie in Treatment: 0 Abuse Risk Screen Items Answer ABUSE RISK SCREEN: Has anyone close to you tried to hurt or harm you recentlyo No Do you feel uncomfortable with anyone in your familyo No Has anyone forced you do things that you didnot want to doo No Electronic Signature(s) Signed: 01/21/2022 2:19:12 PM By: Carlene Coria RN Entered By: Carlene Coria on 01/20/2022 08:58:27 Tineo, Abayomi A. ([id number]) -------------------------------------------------------------------------------- Activities of Daily Living Details Patient Name: [name], [name] Date of Service: 01/20/2022 8:45 AM Medical Record Number: [id number] Patient Account Number: [id number] Date of Birth/Sex: 19-Apr-1971 (51 y.o. M) Treating RN: Carlene Coria Primary Care Abbygale Lapid: Thereasa Distance Other Clinician: Referring Darrol Brandenburg: Thereasa Distance Treating Glade Strausser/Extender: Yaakov Guthrie in Treatment: 0 Activities of Daily Living Items Answer Activities of Daily Living (Please select one for each item) Drive Automobile Completely Able Take Medications Completely Able Use Telephone Completely Able Care for Appearance Completely Able Use Toilet Completely Able Bath / Shower Completely Able Dress Self Completely Able Feed Self Completely Able Walk Completely Able Get In / Out Bed Completely Able Housework Completely Able Prepare Meals Completely Able Handle Money Completely Able Shop for Self Completely Able Electronic Signature(s) Signed: 01/21/2022 2:19:12 PM By: Carlene Coria RN Entered By:  Carlene Coria on 01/20/2022 08:58:47 Kassebaum, Newman A. ([id number]) -------------------------------------------------------------------------------- Education Screening Details Patient Name: [name], [name] Date of Service: 01/20/2022 8:45 AM Medical Record Number: [id number] Patient Account Number: [id number] Date of Birth/Sex: [birth year] (51 y.o. M) Treating RN: Carlene Coria Primary Care Tonatiuh Mallon: Thereasa Distance Other Clinician: Referring Ashe Gago: Thereasa Distance Treating Taner Rzepka/Extender: Yaakov Guthrie in Treatment: 0 Primary Learner Assessed: Patient Learning Preferences/Education Level/Primary Language Learning Preference: Explanation Highest Education Level: College or Above Preferred Language: [name] Cognitive Barrier Language Barrier: No Translator Needed: No Memory Deficit: No Emotional Barrier: No Cultural/Religious Beliefs Affecting Medical Care: No Physical Barrier Impaired Vision: No Impaired Hearing: No Decreased Hand dexterity: No Knowledge/Comprehension Knowledge Level: Medium Comprehension Level: High Ability to understand written instructions: High Ability to understand verbal instructions: High Motivation Anxiety Level: Anxious Cooperation: Cooperative Education Importance: Acknowledges Need Interest in Health Problems: Asks Questions Perception: Coherent Willingness to Engage in Self-Management High Activities: Readiness to Engage in Self-Management High Activities: Electronic Signature(s) Signed: 01/21/2022 2:19:12 PM By: Carlene Coria RN Entered By: Carlene Coria on 01/20/2022 08:59:22 Shovlin, Quince AMarland Kitchen ([id number]) -------------------------------------------------------------------------------- Fall Risk Assessment Details Patient Name: [name], [name] Date of Service: 01/20/2022 8:45 AM Medical Record Number: [id number] Patient Account Number: [id number] Date of Birth/Sex: 1971/08/25 (51 y.o. M) Treating RN: Carlene Coria Primary Care  Caliana Spires: Thereasa Distance Other Clinician: Referring Azariah Latendresse: Thereasa Distance Treating Yatziri Wainwright/Extender: Yaakov Guthrie in Treatment: 0 Fall Risk Assessment Items Have you had 2 or more falls in the last 12 monthso 0 No Have you had any fall that resulted in injury in the last 12 monthso 0 No FALLS RISK SCREEN History of falling - immediate or within 3 months 0 No Secondary diagnosis (Do you have 2 or more medical diagnoseso) 0 No Ambulatory aid None/bed rest/wheelchair/nurse 0 No Crutches/cane/walker 0 No Furniture 0 No Intravenous therapy Access/Saline/Heparin Lock 0 No Gait/Transferring Normal/ bed rest/ wheelchair 0 No Weak (short steps with  or without shuffle, stooped but able to lift head while walking, may 0 No seek support from furniture) Impaired (short steps with shuffle, may have difficulty arising from chair, head down, impaired 0 No balance) Mental Status Oriented to own ability 0 No Electronic Signature(s) Signed: 01/21/2022 2:19:12 PM By: Carlene Coria RN Entered By: Carlene Coria on 01/20/2022 08:59:37 Edmister, Joshia A. ([id number]) -------------------------------------------------------------------------------- Foot Assessment Details Patient Name: [name], [name] Date of Service: 01/20/2022 8:45 AM Medical Record Number: [id number] Patient Account Number: [id number] Date of Birth/Sex: [birth year] (51 y.o. M) Treating RN: Carlene Coria Primary Care Sharilynn Cassity: Thereasa Distance Other Clinician: Referring Arli Bree: Thereasa Distance Treating Ashmi Blas/Extender: Yaakov Guthrie in Treatment: 0 Foot Assessment Items Site Locations + = Sensation present, - = Sensation absent, C = Callus, U = Ulcer R = Redness, W = Warmth, M = Maceration, PU = Pre-ulcerative lesion F = Fissure, S = Swelling, D = Dryness Assessment Right: Left: Other Deformity: No No Prior Foot Ulcer: No No Prior Amputation: No No Charcot Joint: No No Ambulatory Status: Ambulatory  Without Help Gait: Steady Electronic Signature(s) Signed: 01/21/2022 2:19:12 PM By: Carlene Coria RN Entered By: Carlene Coria on 01/20/2022 09:00:12 [place], [place] A. ([id number]) -------------------------------------------------------------------------------- Nutrition Risk Screening Details Patient Name: Salton, Aldrich A. Date of Service: 01/20/2022 8:45 AM Medical Record Number: [id number] Patient Account Number: [id number] Date of Birth/Sex: 04/25/1971 (51 y.o. M) Treating RN: Carlene Coria Primary Care Khary Schaben: Thereasa Distance Other Clinician: Referring Ericson Nafziger: Thereasa Distance Treating Shelina Luo/Extender: Yaakov Guthrie in Treatment: 0 Height (in): 76 Weight (lbs): 404 Body Mass Index (BMI): 49.2 Nutrition Risk Screening Items Score Screening NUTRITION RISK SCREEN: I have an illness or condition that made me change the kind and/or amount of food I eat 0 No I eat fewer than two meals per day 0 No I eat few fruits and vegetables, or milk products 0 No I have three or more drinks of beer, liquor or wine almost every day 0 No I have tooth or mouth problems that make it hard for me to eat 0 No I don't always have enough money to buy the food I need 0 No I eat alone most of the time 0 No I take three or more different prescribed or over-the-counter drugs a day 1 Yes Without wanting to, I have lost or gained 10 pounds in the last six months 0 No I am not always physically able to shop, cook and/or feed myself 0 No Nutrition Protocols Good Risk Protocol 0 No interventions needed Moderate Risk Protocol High Risk Proctocol Risk Level: Good Risk Score: 1 Electronic Signature(s) Signed: 01/21/2022 2:19:12 PM By: Carlene Coria RN Entered By: Carlene Coria on 01/20/2022 08:59:54

## 2022-01-21 NOTE — Progress Notes (Signed)
JAKYRON, FABRO ([id number]) Visit Report for 01/20/2022 Chief Complaint Document Details Patient Name: [name], [name] Date of Service: 01/20/2022 8:45 AM Medical Record Number: [id number] Patient Account Number: [id number] Date of Birth/Sex: [birth year] (51 y.o. M) Treating RN: Carlene Coria Primary Care Provider: Thereasa Distance Other Clinician: Referring Provider: Thereasa Distance Treating Provider/Extender: Yaakov Guthrie in Treatment: 0 Information Obtained from: Patient Chief Complaint 01/20/22; wound to previous central line catheter to the right side of the neck Electronic Signature(s) Signed: 01/20/2022 11:08:22 AM By: Kalman Shan DO Entered By: Kalman Shan on 01/20/2022 10:21:17 [name], Shady A. ([id number]) -------------------------------------------------------------------------------- HPI Details Patient Name: [name], [name] Date of Service: 01/20/2022 8:45 AM Medical Record Number: [id number] Patient Account Number: [id number] Date of Birth/Sex: 01-21-1971 (51 y.o. M) Treating RN: Carlene Coria Primary Care Provider: Thereasa Distance Other Clinician: Referring Provider: Thereasa Distance Treating Provider/Extender: Yaakov Guthrie in Treatment: 0 History of Present Illness HPI Description: 12/28/17; this is a now 51 year old man who is a type II diabetic. He was hospitalized from 10/01/17 through 10/19/17. He had an MSSA soft tissue and skin infection. 2 open areas on the left leg were identified he has a smaller area on the left medial calf superiorly just below the knee and a wound just above the left ankle on the posterior medial aspect. I think both of these were surgical IandD sites when he was in the hospital. He was discharged with a wound VAC at that point however this is since been taken off. He follows with Dr. Ola Spurr for the [name] and he is still on chronic Keflex at 500 twice a day. At that time he was hospitalized his hemoglobin A1c was 15.1  however if I'm reading his endocrinologist notes correctly that is improved. He has been following with Dr. [name] at [facility] vascular and he has been applying calcium alginate and Unna boots. He has home health changing the dressing. They have also been attempting to get him external compression pumps although the patient is unaware whether they've been approved by insurance at this point. as mentioned he has a smaller clean wound on the right lateral calf just below the knee and he has a much larger area just above the left ankle medially and posteriorly. Our intake nurse reported greenish purulent looking drainage.the patient did have surgical material sent to pathology in February. This showed chronic abscess The patient also has lymphedema stage III in the left greater than right lower extremities. He has a history of blisters with wounds but these of all were always healed. The patient thinks that the lymphedema may have been present since he was about 51 years old i.e. about 30 years. He does not have graded pressure stockings and has not worn stockings. He does not have a distant history of DVT PE or phlebitis. He has not been systemically unwell fever no chills. He states that his Lasix is recently been reduced. He tells me his kidney function is at "30%" and he has been followed by Dr. [name] of nephrology. At one point he was on Lasix 80 twice a day however that's been cut back and he is now on Lasix at 20 twice a day. The patient has a history of PAD listed in his records although he comes from Dr. [name] I don't think is felt to have significant PAD. ABIs in our clinic were noncompressible bilaterally. 01/04/18; patient has a large wound on the left lateral lower calf and a small wound on the left medial  upper calf. He has been to see his nephrologist who changed him to Demadex 40 mg a day. I'm hopeful this will help with his systemic fluid overload. He also has stage III lymphedema.  Really no change in the 2 wounds since last week 01/11/18; the patient is down 13 pounds. He put his stage III lymphedema left leg in 4K compression last week and there is less edema fluid however we still haven't been able to communicate with home health but apparently it is [facility] but the dressings have not been changed. The patient noted an odor last week. He is also had compression pumps ordered by [facility] this as a not completed the paperwork stage. 01/18/18; patient continues to lose weight. Stage III lymphedema in the left greater than right leg under for alert compression. The major wound is on the left lateral ankle area. He apparently has bilateral compression pumps being brought to his house, these were ordered by [facility] Notable for the fact today he had some blisters on the right anterior leg together with some skin nodules. This is no doubt secondary to severe lymphedema. 01/25/18; the patient has obtained his compression pumps and is using them per vein and vascular instructions 3 times a day for an hour. He also saw [name] of vein and vascular. He was felt to have venous insufficiency but did not suggest any intervention also improved edema. It was suggested that he have compression stockings 20-30 mm on a daily basis in addition to compression pumps. The patient arrives in clinic today with a layer of unna under for layer compression. he seems to have some trouble with the degree of compression. He has open areas on the left lateral ankle area which is his major wound left upper medial calf and a superficial open area on the right anterior shin area which was blistered last week. He has skin changes on the right anterior calf which I think are no doubt secondary to lymphedema skin nodules etc. 02/01/18; the patient comes in telling us his nephrologist have to his torsemide. Unfortunately today he is put on 7 pounds by our scales. He has blisters  all over the anterior and medial part of his right calf and a new open wound. He also has soupy green drainage coming out of the left lateral calf /ankle wound. 02/08/18; culture I did last week of the left lateral ankle wound grew both Pseudomonas and Morganella. He is on Keflex from Dr. Ola Spurr in the hospital. I will need to review these notes.in any case Keflex is not going to cover these 2 organisms. I'm probably going to added ciprofloxacin today for 1 week. A lot of drainage that looks purulent last week. He is not complaining of pain however he has managed to put on 10 pounds in 2 weeks by our scales in this clinic. He is going to see his nephrologist tomorrow 02/15/18; he completed the ciprofloxacin I gave him last week. Notable that he is up to 379 pounds today which is up 16 pounds from 2 weeks ago. He is complaining of orthopnea but doesn't have any chest pain. 02/22/18; he continues to have weight gain. R intake nurse reports again purulent green drainage coming out of the lateral wound on the lateral left calf. He has small open area on the right anterior leg. His torsemide was increased to 2 tablets a day I believe this is 40 mg last week in response to the call admitted to  Dr. Keturah Barre office. He follows up with Dr. [name] and Dr. Delana Meyer tomorrow 03/01/18 his weight essentially stable today at 383 pounds. Drainage out of the left lateral wound on the lower left calf/ankle is a lot less. Culture last time grew Pseudomonas. I put him on cefdinir. He has been to Dr. Keturah Barre office no adjustments in his diuretics. Dr. Nicoletta Dress prescribed a wraparound stocking for the right leg.there is no open area on the right leg. He has a superficial area on the left medial calf, left posterior calf and in the large area on the left lateral however this looks better 03/15/18; weight is not up to 393 pounds. He saw his nephrologist yesterday Dr. [name] will increase the Demadex I'm hopeful this will help  with the edema control. I'm using silver alginate to all his wounds. In particular the left lateral ankle looks better. oUnfortunately he has new open areas on the right lateral calf that will include use of his compression stocking at least in the short to medium term. He has new wounds o3 on the right lateral calf. One of these has some size however all numerous superficial 03/22/18; his weight is stabilized a bit. Just adjustment of his diuretics by his nephrologist. There is no doubt he has some degree of systemic fluid overload on top of severe left greater than right lymphedema. Kenton, Reason A. ([id number]) 2 weeks ago tried to transition him to stockings on the right leg however he developed re-breakdown of skin on the right leg and we had to put him back in compression last week. He also uses external compression pumps and claims to be compliant Continued concern about his depression today 03/29/18; several ongoing issues with this patient; oHe no longer has home health coverage apparently secondary to a lapse in insurance. He is apparently transitioning from short for long-term disability. [facility] was changing his compression wraps on Monday and Friday. oHe has gained 10 pounds since last week oSees Dr. [name] tomorrow oSaw his primary doctor last week about the depression. It sounds as though he declined pharmacologic management. He seems somewhat better today. We were concerned last week when he came. Somewhat better today oHe is using his compression pumps once a day at home, I have asked for twice a day if possible especially on the left leg oFollows up with his nephrologist in mid-August. He is managing his diuretic for I think stage IV chronic renal failure oParadoxically his wounds actually look better 04/19/18; the patient has not been seen since I last saw him 3 weeks ago. He saw Dr. Delana Meyer of vascular surgery on 03/30/18 I believe he put him in a 20/30 stocking bilaterally  with a wraparound extremitease stocking. He has not been putting anything specifically on the wound. More problematic than that he has not been wearing the stockings he is at home. He has been using his external compression pumps once per day according to him on a rare occasion twice On a psychosocial level the patient is now on long-term disability and is applying for COBRA therefore he is between insurances. He has not been able to follow up with Dr. [name] who is his nephrologist as a result. As noted his weight is up to 407 pounds today. He promises me he'll follow-up with Dr. [name] 05/03/18; he hasn't been here in 2 weeks now. He apparently has been wearing a compression sock on the left leg. Massive increase in edema 3 large open wounds on the left anterior leg  that were probably blisters. Significant deterioration in the left lateral ankle wound that we've been doing as his most problematic wound. He still does not have his insurance issues wrapped up. His weight is well over 400 pounds. 05/10/18; arrives today with better looking edema control in the left leg. He has been using his compression pumps twice a day. We also wrapped it is left leg for there he's been using his pumps so there is much better edema control. Most of new wounds from last week look a lot better. Even the refractory area on the lower left lateral ankle looks a lot better to me today. He follows up with Dr. [name] this morning [nephrology] 05/17/18 patient arrives today with a lot less edema in the left leg. His weight is gone down 7 pounds. He tells me he saw Dr. [name] but his torsemide was not adjusted. He is using the palms 3 times a day. There is been quite an improvement in the remaining wound on the left lateral ankle oWeekly visit for follow-up of bilateral lower extremity wounds related to severe lymphedema and probably some degree of systemic fluid overload from chronic renal failure stage IV. His weight is up this  week to over 400 pounds. He noted increasing edema in the right leg late last week. He took his compression stocking off he did not increase the frequency of this compression pump use. He developed a large blister on the back of the right calf. He also has a new opened blister on the left lateral calf in addition to the wound that we've been using on the distal left lower calf area and we've been using silver alginate. He tells me that he has an ultrasound which is a DVT rule out and I think a reflux study ordered by Dr. [name] 05/31/18; weekly visit. He went to see vein and vascular this week apparently they remove the 4-layer compression on the left and put an Unna boot on him in replacement. He's got more swelling in the left leg is resolved. That being said his left lower Wound is better. He still has a fairly large wound on the right posterior calf this was not disturbed. He has a follow-up appointment with Dr. Keturah Barre nephrologist next week 06/07/2018; the patient arrives today with a history that he took both his compression wraps off 3 days ago in order to take a shower. He has bilateral severe tense blisters. A lot of weeping erythema especially on the lateral left calf. Almost circumferential blisters on the right. Multiple areas of epithelial breakdown. He does not complain of any pain fever chills. He states he has been using his compression pump in some form of stocking that I could not really determine the type. He did not come into the clinic with anything on his legs. He tells me he has an appointment with Dr. Merita Norton his nephrologist I believe this Friday. Paradoxically his weight is actually down to 385 pounds I believe last week he was over 400 06/14/18; arrives with better looking wound surfaces today on both legs. There is no further blistering however there are still areas that aren't epithelialized on the right anterior, right lateral, left posterior and left medial. His original  wound just above the left ankle laterally is still open moist. His weight was about the same today. He tells me that his nephrologist increase the torsemide from 40/20/09/1939/40 twice a day 06/21/18; both his wraps fell down to his mid calf. Has a result he  has multiple blisters across the anterior right leg above with the wraps ultimately watched. He also has blisters on the posterior part of the left calf o2. His original wound on the left lateral calf is hard to see any open area. There is however a divot. Which is going to be difficult to deal with into the future until the edema in the left leg is controlled. 2 small superficial areas remain He tells [place] his weight was 386 pounds on his scale at home. According to our scale he is up 5 pounds. He is not on a fluid restriction 06/28/18; patient's weight is gone up 2 pounds since last time. He has weeping edema and open superficial wounds on the right posterior right lateral and right anterior calf. He has a small open area on the left anterior probably left posterior calf and the original wound on the left lateral calf appears to be just about closed He is being planned for a dialysis shunt in the right arm through [facility] incoordination with his nephrologist Dr. [name] He claims to be using his compression pumps twice a day. He has lymphedema and no doubt systemic fluid volume overload from stage IV chronic renal failure 07/04/18; patient's weight is up into the 396 range. He is supposed to see his cardiologist tomorrow and plans are being made for a shunt by Dr. Delana Meyer. He still has significant open wounds/draining areas on the right anterior and right posterior calf. Several small open areas on the left which are less in terms of wound area on the right which is surprising given the fact the left is the larger most lymphedematous leg 07/26/2018 She is seen today for follow-up and management of lateral posterior lower leg wound and  bilateral lymphoedema. He has a very flat affect and depressed mood today. Unable to elicit much information from him today. No thoughts of harm to self identified. Recently had a follow- up with vascular vein for fistula placement to initiate dialysis in the right arm. He has 2 dressings on the right arm from the fistula procedure with no bleeding or drainage noted on the dressing. He does have a large amount of bruising in the surrounding to puncture areas on the right lower arm from the fistula placement. No pain elicited with palpation of the right arm. He denies a diminished sensation in that right lower arm as well. He does have a small wound to the left lateral posterior lower leg. No visual drainage present to either leg. However the wound to the left lower leg does have a foul odor even after cleaning it. The left is the larger most lymphedematous leg with the right Leg still having a significant amount of edema. No drainage or blisters present on either leg today. He states that he is using the lymphedema pumps twice a day. Not too sure if he is compliant with actually using the lymphedema pumps based on the amount of edema that he has in his legs. Weight increased from 396.1 to 401.6. No recent fevers, chills, or shortness of breath. Teel, Ahkeem A. ([id number]) 08/02/18; the patient only has one remaining wound on the left lateral calf which is still open. This is in the resultant [place] shaped area which was once the site of the major wound. He does not have any open areas on the right leg nor additional wounds on the left no blistering. As usual the left leg is much larger than the right. He comes in today stating  that he took the wraps off on the right leg on Friday and since then he has presumably been wearing his stocking which is a wraparound variant stocking on the right. States he took the 4-layer compression off his left leg this morning to shower. He is seeing vein and vascular  tomorrow. He states he is using his compression pumps once or twice a day. He also admits that he has been on his feet a lot 08/09/18; the patient has the left leg healed except for the original wound site on the left medial Just above the ankle. On the right he has a new wound on the right lateral calf. He saw vein and vascular last week and he is been back in his pressure stockings and wraparound stockings. He also is been put on metolazone 3 times a week by nephrology along with his torsemide. I'm hopeful that this will help with some of the lower extremity edema. I've always felt that he has lymphedema but there may be a secondary component contributing 08/16/18; the patient used his own junk slight stockings to both legs last week. He claims he is pumping once to twice a day at home. Here rise with his legs looking visibly less edematous. His usual left greater than right secondary to lymphedema. He is tolerating the metolazone 3 times a week along with the torsemide. His weight was down 2 pounds. He follows up with nephrology soon for follow-up lab work I think in early January. He does not have any open areas on the right leg he still has the crevice on the lateral left calf just above the ankle there is 2 small open areas here I am not sure that this is ever going to be free of an open area although certainly not worsening. More problematic lead he has 2 blistered areas just above the wrist on the left lateral calf 09/06/2018; the patient arrives with much larger legs than I remember seeing a month ago especially on the right. He has 1 open area on the left lateral calf and he had a 4 layer compression on this on arrival in the clinic. This was apparently put on by [facility] in response to a new open wound. He claims he is pumping once or more often twice a day although I really have a hard time believing it. He came in with nothing but stockings on his legs. There is tremendous increase in the  edema bilaterally 1/22; the patient only has the one area that is not totally closed and that is the divot on the left lateral calf. I do not think we are going to be able to do anything further to this unless he loses edema fluid in his left leg when he starts dialysis which I gather is sometime soon. He is using his own wraparound compression garment. He is using the compression pumps twice a day. 2/19; the patient does not have any open wounds on the right leg but both legs left and the right have extensive edema is usually worse on the left. He has a tense blister on the left anterior leg. Using his compression pumps usually once a day per his description. He does not come in with the compression stockings 2/26. Right leg remains without any open wounds that he has his own stocking. The tense blister on the left anterior leg is closed over. He still has the open area on the left lateral mid calf and then the divot injury area which I  do not think is going to heal just above the left ankle. 3/11; the patient arrives with all the wounds on both legs healed. He has his stocking with the wraparound juxta lite on the right. He has 1 for the left. He initiated dialysis on Monday and we will see what effect that has on his lower extremity edema. He has his compression pumps and he plans to use them at least once a day Readmission 01/20/2022 Mr. Costlow [place] is a 51 year old male with a past medical history of type 1 diabetes on hemodialysis that presents to the clinic for a 1 year history of wound to the right side of his neck. He states that he had a central line in place when he started hemodialysis while his graft matured. He states that the area where the line was removed never healed. He reports serosanguineous drainage and odor to the area over the past year. He has not taken antibiotics for this issue. He currently denies systemic signs of infection. He currently keeps the area covered with a  Band-Aid. Electronic Signature(s) Signed: 01/20/2022 11:08:22 AM By: Kalman Shan DO Entered By: Kalman Shan on 01/20/2022 10:23:48 Velarde, Eldridge A. ([id number]) -------------------------------------------------------------------------------- CHEM CAUT GRANULATION TISS Details Patient Name: [name], [name] Date of Service: 01/20/2022 8:45 AM Medical Record Number: [id number] Patient Account Number: [id number] Date of Birth/Sex: [birth year] (51 y.o. M) Treating RN: Carlene Coria Primary Care Provider: Thereasa Distance Other Clinician: Referring Provider: Thereasa Distance Treating Provider/Extender: Yaakov Guthrie in Treatment: 0 Procedure Performed for: Wound #19 Right Chest Performed By: Physician Kalman Shan, MD Post Procedure Diagnosis Same as Pre-procedure Electronic Signature(s) Signed: 01/21/2022 2:19:12 PM By: Carlene Coria RN Entered By: Carlene Coria on 01/20/2022 09:37:25 Lykins, Callaway A. ([id number]) -------------------------------------------------------------------------------- Physical Exam Details Patient Name: [name], [name] Date of Service: 01/20/2022 8:45 AM Medical Record Number: [id number] Patient Account Number: [id number] Date of Birth/Sex: 18-Feb-1971 (51 y.o. M) Treating RN: Carlene Coria Primary Care Provider: Thereasa Distance Other Clinician: Referring Provider: Thereasa Distance Treating Provider/Extender: Yaakov Guthrie in Treatment: 0 Constitutional . Psychiatric . Notes Right side of the neck: Open wound with hyper granulated tissue at the opening with increased depth. On palpation there is thickened serosanguineous drainage. No signs of surrounding soft tissue infection. Electronic Signature(s) Signed: 01/20/2022 11:08:22 AM By: Kalman Shan DO Entered By: Kalman Shan on 01/20/2022 10:25:54 Mcelveen, Rayne AMarland Kitchen ([id number]) -------------------------------------------------------------------------------- Physician Orders  Details Patient Name: Fitting, Josede A. Date of Service: 01/20/2022 8:45 AM Medical Record Number: [id number] Patient Account Number: [id number] Date of Birth/Sex: [birth year] (51 y.o. M) Treating RN: Carlene Coria Primary Care Provider: Thereasa Distance Other Clinician: Referring Provider: Thereasa Distance Treating Provider/Extender: Yaakov Guthrie in Treatment: 0 Verbal / Phone Orders: No Diagnosis Coding ICD-10 Coding Code Description S11.80XA Unspecified open wound of other specified part of neck, initial encounter E10.622 Type 1 diabetes mellitus with other skin ulcer N18.6 End stage renal disease E10.39 Type 1 diabetes mellitus with other diabetic ophthalmic complication Follow-up Appointments o Return Appointment in 1 week. Bathing/ Shower/ Hygiene o May shower; gently cleanse wound with antibacterial soap, rinse and pat dry prior to dressing wounds Wound Treatment Wound #19 - Clavicle Wound Laterality: Right Cleanser: Soap and Water 1 x Per Day/30 Days Discharge Instructions: Gently cleanse wound with antibacterial soap, rinse and pat dry prior to dressing wounds Topical: Mupirocin Ointment 1 x Per Day/30 Days Discharge Instructions: Apply as directed by provider. Primary Dressing: Hydrofera Blue Ready Transfer Foam, 4x5 (in/in) 1 x Per Day/30  Days Discharge Instructions: Apply Hydrofera Blue Ready to wound bed as directed Secondary Dressing: Coverlet Latex-Free Fabric Adhesive Dressings 1 x Per Day/30 Days Discharge Instructions: 1.5 x 2 Laboratory o Bacteria identified in Wound by Culture (MICRO) - non healing wound right clavicle - (ICD10 S11.80XA - Unspecified open wound of other specified part of neck, initial encounter) oooo LOINC Code: 6462-6 oooo Convenience Name: Wound culture routine Services and Therapies o US soft tissue right upper extrimity limited ( non vascular) - right clavicle non healing wound - (ICD10 S11.80XA - Unspecified open wound of  other specified part of neck, initial encounter) Patient Medications Allergies: lisinopril, latex, tape, occlusive adhesive Notifications Medication Indication Start End doxycycline hyclate 01/20/2022 DOSE 1 - oral 100 mg capsule - 1 capsule oral BID x 10 days mupirocin 01/20/2022 DOSE 1 - topical 2 % ointment - apply daily Dix, Nykeem A. ([id number]) Electronic Signature(s) Signed: 01/20/2022 11:56:13 AM By: Carlene Coria RN Signed: 01/20/2022 2:24:38 PM By: Kalman Shan DO Previous Signature: 01/20/2022 11:53:53 AM Version By: Carlene Coria RN Previous Signature: 01/20/2022 9:55:49 AM Version By: Carlene Coria RN Previous Signature: 01/20/2022 11:08:22 AM Version By: Kalman Shan DO Previous Signature: 01/20/2022 9:41:28 AM Version By: Kalman Shan DO Entered By: Carlene Coria on 01/20/2022 11:56:13 Decoster, Jimmy A. ([id number]) -------------------------------------------------------------------------------- Problem List Details Patient Name: Teague, Pasqual A. Date of Service: 01/20/2022 8:45 AM Medical Record Number: [id number] Patient Account Number: [id number] Date of Birth/Sex: 06-Sep-1970 (51 y.o. M) Treating RN: Carlene Coria Primary Care Provider: Thereasa Distance Other Clinician: Referring Provider: Thereasa Distance Treating Provider/Extender: Yaakov Guthrie in Treatment: 0 Active Problems ICD-10 Encounter Code Description Active Date MDM Diagnosis S11.80XA Unspecified open wound of other specified part of neck, initial encounter 01/20/2022 No Yes E10.622 Type 1 diabetes mellitus with other skin ulcer 01/20/2022 No Yes N18.6 End stage renal disease 01/20/2022 No Yes E10.39 Type 1 diabetes mellitus with other diabetic ophthalmic complication 12/30/8880 No Yes Inactive Problems Resolved Problems Electronic Signature(s) Signed: 01/20/2022 11:08:22 AM By: Kalman Shan DO Entered By: Kalman Shan on 01/20/2022 10:19:51 Butkus, Rodell A.  ([id number]) -------------------------------------------------------------------------------- Progress Note Details Patient Name: Gengler, Zhane A. Date of Service: 01/20/2022 8:45 AM Medical Record Number: [id number] Patient Account Number: [id number] Date of Birth/Sex: 05-17-1971 (51 y.o. M) Treating RN: Carlene Coria Primary Care Provider: Thereasa Distance Other Clinician: Referring Provider: Thereasa Distance Treating Provider/Extender: Yaakov Guthrie in Treatment: 0 Subjective Chief Complaint Information obtained from Patient 01/20/22; wound to previous central line catheter to the right side of the neck History of Present Illness (HPI) 12/28/17; this is a now 51 year old man who is a type II diabetic. He was hospitalized from 10/01/17 through 10/19/17. He had an MSSA soft tissue and skin infection. 2 open areas on the left leg were identified he has a smaller area on the left medial calf superiorly just below the knee and a wound just above the left ankle on the posterior medial aspect. I think both of these were surgical IandD sites when he was in the hospital. He was discharged with a wound VAC at that point however this is since been taken off. He follows with Dr. Ola Spurr for the [facility] and he is still on chronic Keflex at 500 twice a day. At that time he was hospitalized his hemoglobin A1c was 15.1 however if I'm reading his endocrinologist notes correctly that is improved. He has been following with Dr. [name] at [facility] and he has been applying calcium alginate and Unna boots. He has  home health changing the dressing. They have also been attempting to get him external compression pumps although the patient is unaware whether they've been approved by insurance at this point. as mentioned he has a smaller clean wound on the right lateral calf just below the knee and he has a much larger area just above the left ankle medially and posteriorly. Our intake nurse reported  greenish purulent looking drainage.the patient did have surgical material sent to pathology in February. This showed chronic abscess The patient also has lymphedema stage III in the left greater than right lower extremities. He has a history of blisters with wounds but these of all were always healed. The patient thinks that the lymphedema may have been present since he was about 51 years old i.e. about 30 years. He does not have graded pressure stockings and has not worn stockings. He does not have a distant history of DVT PE or phlebitis. He has not been systemically unwell fever no chills. He states that his Lasix is recently been reduced. He tells me his kidney function is at "30%" and he has been followed by Dr. [name] of nephrology. At one point he was on Lasix 80 twice a day however that's been cut back and he is now on Lasix at 20 twice a day. The patient has a history of PAD listed in his records although he comes from Dr. [name] I don't think is felt to have significant PAD. ABIs in our clinic were noncompressible bilaterally. 01/04/18; patient has a large wound on the left lateral lower calf and a small wound on the left medial upper calf. He has been to see his nephrologist who changed him to Demadex 40 mg a day. I'm hopeful this will help with his systemic fluid overload. He also has stage III lymphedema. Really no change in the 2 wounds since last week 01/11/18; the patient is down 13 pounds. He put his stage III lymphedema left leg in 4K compression last week and there is less edema fluid however we still haven't been able to communicate with home health but apparently it is [facility] but the dressings have not been changed. The patient noted an odor last week. He is also had compression pumps ordered by [facility] this as a not completed the paperwork stage. 01/18/18; patient continues to lose weight. Stage III lymphedema in the left greater than right leg under for alert  compression. The major wound is on the left lateral ankle area. He apparently has bilateral compression pumps being brought to his house, these were ordered by [facility] Notable for the fact today he had some blisters on the right anterior leg together with some skin nodules. This is no doubt secondary to severe lymphedema. 01/25/18; the patient has obtained his compression pumps and is using them per vein and vascular instructions 3 times a day for an hour. He also saw [name] of vein and vascular. He was felt to have venous insufficiency but did not suggest any intervention also improved edema. It was suggested that he have compression stockings 20-30 mm on a daily basis in addition to compression pumps. The patient arrives in clinic today with a layer of unna under for layer compression. he seems to have some trouble with the degree of compression. He has open areas on the left lateral ankle area which is his major wound left upper medial calf and a superficial open area on the right anterior shin area which was blistered  last week. He has skin changes on the right anterior calf which I think are no doubt secondary to lymphedema skin nodules etc. 02/01/18; the patient comes in telling us his nephrologist have to his torsemide. Unfortunately today he is put on 7 pounds by our scales. He has blisters all over the anterior and medial part of his right calf and a new open wound. He also has soupy green drainage coming out of the left lateral calf /ankle wound. 02/08/18; culture I did last week of the left lateral ankle wound grew both Pseudomonas and Morganella. He is on Keflex from Dr. Ola Spurr in the hospital. I will need to review these notes.in any case Keflex is not going to cover these 2 organisms. I'm probably going to added ciprofloxacin today for 1 week. A lot of drainage that looks purulent last week. He is not complaining of pain however he has managed to put on 10 pounds in  2 weeks by our scales in this clinic. He is going to see his nephrologist tomorrow 02/15/18; he completed the ciprofloxacin I gave him last week. Notable that he is up to 379 pounds today which is up 16 pounds from 2 weeks ago. He is complaining of orthopnea but doesn't have any chest pain. 02/22/18; he continues to have weight gain. R intake nurse reports again purulent green drainage coming out of the lateral wound on the lateral left calf. He has small open area on the right anterior leg. His torsemide was increased to 2 tablets a day I believe this is 40 mg last week in response to the call admitted to Dr. Keturah Barre office. He follows up with Dr. [name] and Dr. Delana Meyer tomorrow 03/01/18 his weight essentially stable today at 383 pounds. Drainage out of the left lateral wound on the lower left calf/ankle is a lot less. Culture last time grew Pseudomonas. I put him on cefdinir. He has been to Dr. Keturah Barre office no adjustments in his diuretics. Dr. Nicoletta Dress prescribed a wraparound stocking for the right leg.there is no open area on the right leg. He has a superficial area on the left medial calf, left posterior calf and in the large area on the left lateral however this looks better 03/15/18; weight is not up to 393 pounds. He saw his nephrologist yesterday Dr. [name] will increase the Demadex I'm hopeful this will help with Granberry, Erskin A. ([id number]) the edema control. I'm using silver alginate to all his wounds. In particular the left lateral ankle looks better. Unfortunately he has new open areas on the right lateral calf that will include use of his compression stocking at least in the short to medium term. He has new wounds o3 on the right lateral calf. One of these has some size however all numerous superficial 03/22/18; his weight is stabilized a bit. Just adjustment of his diuretics by his nephrologist. There is no doubt he has some degree of systemic fluid overload on top of severe left greater than  right lymphedema. 2 weeks ago tried to transition him to stockings on the right leg however he developed re-breakdown of skin on the right leg and we had to put him back in compression last week. He also uses external compression pumps and claims to be compliant Continued concern about his depression today 03/29/18; several ongoing issues with this patient; He no longer has home health coverage apparently secondary to a lapse in insurance. He is apparently transitioning from short for long-term disability. [facility] was changing his  compression wraps on Monday and Friday. He has gained 10 pounds since last week Sees Dr. [name] tomorrow Saw his primary doctor last week about the depression. It sounds as though he declined pharmacologic management. He seems somewhat better today. We were concerned last week when he came. Somewhat better today He is using his compression pumps once a day at home, I have asked for twice a day if possible especially on the left leg Follows up with his nephrologist in mid-August. He is managing his diuretic for I think stage IV chronic renal failure Paradoxically his wounds actually look better 04/19/18; the patient has not been seen since I last saw him 3 weeks ago. He saw Dr. Delana Meyer of vascular surgery on 03/30/18 I believe he put him in a 20/30 stocking bilaterally with a wraparound extremitease stocking. He has not been putting anything specifically on the wound. More problematic than that he has not been wearing the stockings he is at home. He has been using his external compression pumps once per day according to him on a rare occasion twice On a psychosocial level the patient is now on long-term disability and is applying for COBRA therefore he is between insurances. He has not been able to follow up with Dr. [name] who is his nephrologist as a result. As noted his weight is up to 407 pounds today. He promises me he'll follow-up with Dr. [name] 05/03/18; he  hasn't been here in 2 weeks now. He apparently has been wearing a compression sock on the left leg. Massive increase in edema 3 large open wounds on the left anterior leg that were probably blisters. Significant deterioration in the left lateral ankle wound that we've been doing as his most problematic wound. He still does not have his insurance issues wrapped up. His weight is well over 400 pounds. 05/10/18; arrives today with better looking edema control in the left leg. He has been using his compression pumps twice a day. We also wrapped it is left leg for there he's been using his pumps so there is much better edema control. Most of new wounds from last week look a lot better. Even the refractory area on the lower left lateral ankle looks a lot better to me today. He follows up with Dr. [name] this morning [nephrology] 05/17/18 patient arrives today with a lot less edema in the left leg. His weight is gone down 7 pounds. He tells me he saw Dr. [name] but his torsemide was not adjusted. He is using the palms 3 times a day. There is been quite an improvement in the remaining wound on the left lateral ankle Weekly visit for follow-up of bilateral lower extremity wounds related to severe lymphedema and probably some degree of systemic fluid overload from chronic renal failure stage IV. His weight is up this week to over 400 pounds. He noted increasing edema in the right leg late last week. He took his compression stocking off he did not increase the frequency of this compression pump use. He developed a large blister on the back of the right calf. He also has a new opened blister on the left lateral calf in addition to the wound that we've been using on the distal left lower calf area and we've been using silver alginate. He tells me that he has an ultrasound which is a DVT rule out and I think a reflux study ordered by Dr. [name] 05/31/18; weekly visit. He went to see vein and vascular this week  apparently they remove the 4-layer compression on the left and put an Unna boot on him in replacement. He's got more swelling in the left leg is resolved. That being said his left lower Wound is better. He still has a fairly large wound on the right posterior calf this was not disturbed. He has a follow-up appointment with Dr. Keturah Barre nephrologist next week 06/07/2018; the patient arrives today with a history that he took both his compression wraps off 3 days ago in order to take a shower. He has bilateral severe tense blisters. A lot of weeping erythema especially on the lateral left calf. Almost circumferential blisters on the right. Multiple areas of epithelial breakdown. He does not complain of any pain fever chills. He states he has been using his compression pump in some form of stocking that I could not really determine the type. He did not come into the clinic with anything on his legs. He tells me he has an appointment with Dr. Merita Norton his nephrologist I believe this Friday. Paradoxically his weight is actually down to 385 pounds I believe last week he was over 400 06/14/18; arrives with better looking wound surfaces today on both legs. There is no further blistering however there are still areas that aren't epithelialized on the right anterior, right lateral, left posterior and left medial. His original wound just above the left ankle laterally is still open moist. His weight was about the same today. He tells me that his nephrologist increase the torsemide from 40/20/09/1939/40 twice a day 06/21/18; both his wraps fell down to his mid calf. Has a result he has multiple blisters across the anterior right leg above with the wraps ultimately watched. He also has blisters on the posterior part of the left calf o2. His original wound on the left lateral calf is hard to see any open area. There is however a divot. Which is going to be difficult to deal with into the future until the edema in the left  leg is controlled. 2 small superficial areas remain He tells [place] his weight was 386 pounds on his scale at home. According to our scale he is up 5 pounds. He is not on a fluid restriction 06/28/18; patient's weight is gone up 2 pounds since last time. He has weeping edema and open superficial wounds on the right posterior right lateral and right anterior calf. He has a small open area on the left anterior probably left posterior calf and the original wound on the left lateral calf appears to be just about closed He is being planned for a dialysis shunt in the right arm through [facility] incoordination with his nephrologist Dr. [name] He claims to be using his compression pumps twice a day. He has lymphedema and no doubt systemic fluid volume overload from stage IV chronic renal failure 07/04/18; patient's weight is up into the 396 range. He is supposed to see his cardiologist tomorrow and plans are being made for a shunt by Dr. Delana Meyer. He still has significant open wounds/draining areas on the right anterior and right posterior calf. Several small open areas on the left which are less in terms of wound area on the right which is surprising given the fact the left is the larger most lymphedematous leg 07/26/2018 She is seen today for follow-up and management of lateral posterior lower leg wound and bilateral lymphoedema. He has a very flat affect and depressed mood today. Unable to elicit much information from him today. No thoughts of  harm to self identified. Recently had a follow- up with vascular vein for fistula placement to initiate dialysis in the right arm. He has 2 dressings on the right arm from the fistula procedure with no bleeding or drainage noted on the dressing. He does have a large amount of bruising in the surrounding to puncture areas on the right lower arm from the fistula placement. No pain elicited with palpation of the right arm. He denies a diminished sensation in that  right lower arm as well. Kaminski, Tarrence A. ([id number]) He does have a small wound to the left lateral posterior lower leg. No visual drainage present to either leg. However the wound to the left lower leg does have a foul odor even after cleaning it. The left is the larger most lymphedematous leg with the right Leg still having a significant amount of edema. No drainage or blisters present on either leg today. He states that he is using the lymphedema pumps twice a day. Not too sure if he is compliant with actually using the lymphedema pumps based on the amount of edema that he has in his legs. Weight increased from 396.1 to 401.6. No recent fevers, chills, or shortness of breath. 08/02/18; the patient only has one remaining wound on the left lateral calf which is still open. This is in the resultant [place] shaped area which was once the site of the major wound. He does not have any open areas on the right leg nor additional wounds on the left no blistering. As usual the left leg is much larger than the right. He comes in today stating that he took the wraps off on the right leg on Friday and since then he has presumably been wearing his stocking which is a wraparound variant stocking on the right. States he took the 4-layer compression off his left leg this morning to shower. He is seeing vein and vascular tomorrow. He states he is using his compression pumps once or twice a day. He also admits that he has been on his feet a lot 08/09/18; the patient has the left leg healed except for the original wound site on the left medial Just above the ankle. On the right he has a new wound on the right lateral calf. He saw vein and vascular last week and he is been back in his pressure stockings and wraparound stockings. He also is been put on metolazone 3 times a week by nephrology along with his torsemide. I'm hopeful that this will help with some of the lower extremity edema. I've always felt that he has  lymphedema but there may be a secondary component contributing 08/16/18; the patient used his own junk slight stockings to both legs last week. He claims he is pumping once to twice a day at home. Here rise with his legs looking visibly less edematous. His usual left greater than right secondary to lymphedema. He is tolerating the metolazone 3 times a week along with the torsemide. His weight was down 2 pounds. He follows up with nephrology soon for follow-up lab work I think in early January. He does not have any open areas on the right leg he still has the crevice on the lateral left calf just above the ankle there is 2 small open areas here I am not sure that this is ever going to be free of an open area although certainly not worsening. More problematic lead he has 2 blistered areas just above the wrist on the left lateral  calf 09/06/2018; the patient arrives with much larger legs than I remember seeing a month ago especially on the right. He has 1 open area on the left lateral calf and he had a 4 layer compression on this on arrival in the clinic. This was apparently put on by [facility] in response to a new open wound. He claims he is pumping once or more often twice a day although I really have a hard time believing it. He came in with nothing but stockings on his legs. There is tremendous increase in the edema bilaterally 1/22; the patient only has the one area that is not totally closed and that is the divot on the left lateral calf. I do not think we are going to be able to do anything further to this unless he loses edema fluid in his left leg when he starts dialysis which I gather is sometime soon. He is using his own wraparound compression garment. He is using the compression pumps twice a day. 2/19; the patient does not have any open wounds on the right leg but both legs left and the right have extensive edema is usually worse on the left. He has a tense blister on the left anterior leg.  Using his compression pumps usually once a day per his description. He does not come in with the compression stockings 2/26. Right leg remains without any open wounds that he has his own stocking. The tense blister on the left anterior leg is closed over. He still has the open area on the left lateral mid calf and then the divot injury area which I do not think is going to heal just above the left ankle. 3/11; the patient arrives with all the wounds on both legs healed. He has his stocking with the wraparound juxta lite on the right. He has 1 for the left. He initiated dialysis on Monday and we will see what effect that has on his lower extremity edema. He has his compression pumps and he plans to use them at least once a day Readmission 01/20/2022 Mr. Costlow Villwock is a 51 year old male with a past medical history of type 1 diabetes on hemodialysis that presents to the clinic for a 1 year history of wound to the right side of his neck. He states that he had a central line in place when he started hemodialysis while his graft matured. He states that the area where the line was removed never healed. He reports serosanguineous drainage and odor to the area over the past year. He has not taken antibiotics for this issue. He currently denies systemic signs of infection. He currently keeps the area covered with a Band-Aid. Patient History Information obtained from Patient. Allergies lisinopril, latex, tape, occlusive adhesive Family History Cancer - Maternal Grandparents, Diabetes - Father,Child, Hypertension - Paternal Grandparents, Kidney Disease - Father, Lung Disease - Maternal Grandparents, No family history of Heart Disease, Hereditary Spherocytosis, Seizures, Stroke, Thyroid Problems, Tuberculosis. Social History Never smoker, Marital Status - Separated, Alcohol Use - Rarely, Drug Use - No History, Caffeine Use - Rarely. Medical History Cardiovascular Patient has history of  Hypertension Endocrine Patient has history of Type II Diabetes Neurologic Patient has history of Neuropathy Objective Dutson, Dequon A. ([id number]) Constitutional Vitals Time Taken: 8:51 AM, Height: 76 in, Source: Stated, Weight: 404 lbs, Source: Stated, BMI: 49.2, Temperature: 98.3 F, Pulse: 79 bpm, Respiratory Rate: 18 breaths/min, Blood Pressure: 151/90 mmHg. General Notes: Right side of the neck: Open wound with hyper granulated tissue  at the opening with increased depth. On palpation there is thickened serosanguineous drainage. No signs of surrounding soft tissue infection. Integumentary (Hair, Skin) Wound #19 status is Open. Original cause of wound was Surgical Injury. The date acquired was: 08/30/2020. The wound is located on the Right Clavicle. The wound measures 1cm length x 0.5cm width x 1.4cm depth; 0.393cm^2 area and 0.55cm^3 volume. There is no tunneling or undermining noted. There is a medium amount of serosanguineous drainage noted. There is large (67-100%) red granulation within the wound bed. There is a small (1-33%) amount of necrotic tissue within the wound bed including Adherent Slough. 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 There was thickened serosanguineous drainage on palpation. This was cultured. I will go ahead and start him on doxycycline. I also recommended mupirocin ointment to this area And cover with [facility] for drainage control. The opening was hyper granulated and I used silver nitrate to this area. Due to the chronicity of the wound and location I recommended an ultrasound to assure there is no abscess or other concerning features. Follow-up in  1 week. Procedures Wound #19 Pre-procedure diagnosis of Wound #19 is a Dehisced Wound located on the Right Chest . An CHEM CAUT GRANULATION TISS procedure was performed by Kalman Shan, MD. Post procedure Diagnosis Wound #19: Same as Pre-Procedure Plan Follow-up Appointments: Return Appointment in 1 week. Bathing/ Shower/ Hygiene: May shower; gently cleanse wound with antibacterial soap, rinse and pat dry prior to dressing wounds Services and Therapies ordered were: Duplex scan right clavicle - right upper chest non healing wound Laboratory ordered were: Wound culture routine right clavicle - non healing wound right clavicle The following medication(s) was prescribed: doxycycline hyclate oral 100 mg capsule 1 1 capsule oral BID x 10 days starting 01/20/2022 mupirocin topical 2 % ointment 1 apply daily starting 01/20/2022 WOUND #19: - Clavicle Wound Laterality: Right Cleanser: Soap and Water 1 x Per Day/30 Days Discharge Instructions: Gently cleanse wound with antibacterial soap, rinse and pat dry prior to dressing wounds Topical: Mupirocin Ointment 1 x Per Day/30 Days Discharge Instructions: Apply as directed by provider. Primary Dressing: Hydrofera Blue Ready Transfer Foam, 4x5 (in/in) 1 x Per Day/30 Days Discharge Instructions: Apply Hydrofera Blue Ready to wound bed as directed Secondary Dressing: Coverlet Latex-Free Fabric Adhesive Dressings 1 x Per Day/30 Days Discharge Instructions: 1.5 x 2 Wattley, Derian A. ([id number]) 1. Doxycycline 2. Mupirocin ointment And Hydrofera Blue 3. Soft tissue ultrasound of the right side of the neck 4. Follow-up in 1 week Electronic Signature(s) Signed: 01/20/2022 11:08:22 AM By: Kalman Shan DO Entered By: Kalman Shan on 01/20/2022 11:07:42 Catapano, Josias A. ([id number]) -------------------------------------------------------------------------------- ROS/PFSH Details Patient Name: [name], [name] Date of Service: 01/20/2022 8:45  AM Medical Record Number: [id number] Patient Account Number: [id number] Date of Birth/Sex: November 21, 1970 (51 y.o. M) Treating RN: Carlene Coria Primary Care Provider: Thereasa Distance Other Clinician: Referring Provider: Thereasa Distance Treating Provider/Extender: Yaakov Guthrie in Treatment: 0 Information Obtained From Patient Cardiovascular Medical History: Positive for: Hypertension Endocrine Medical History: Positive for: Type II Diabetes Time with diabetes: 15 yrs Treated with: Insulin, Oral agents Blood sugar tested every day: Yes Tested : Neurologic Medical History: Positive for: Neuropathy Immunizations Pneumococcal Vaccine: Received  Pneumococcal Vaccination: Yes Received Pneumococcal Vaccination On or After 60th Birthday: No Implantable Devices No devices added Family and Social History Cancer: Yes - Maternal Grandparents; Diabetes: Yes - Father,Child; Heart Disease: No; Hereditary Spherocytosis: No; Hypertension: Yes - Paternal Grandparents; Kidney Disease: Yes - Father; Lung Disease: Yes - Maternal Grandparents; Seizures: No; Stroke: No; Thyroid Problems: No; Tuberculosis: No; Never smoker; Marital Status - Separated; Alcohol Use: Rarely; Drug Use: No History; Caffeine Use: Rarely; Financial Concerns: No; Food, Clothing or Shelter Needs: No; Support System Lacking: No; Transportation Concerns: No Electronic Signature(s) Signed: 01/20/2022 11:08:22 AM By: Kalman Shan DO Signed: 01/21/2022 2:19:12 PM By: Carlene Coria RN Entered By: Carlene Coria on 01/20/2022 08:58:18 Mctighe, Galen A. ([id number]) -------------------------------------------------------------------------------- SuperBill Details Patient Name: [name], [name] Date of Service: 01/20/2022 Medical Record Number: [id number] Patient Account Number: [id number] Date of Birth/Sex: 1971-08-03 (51 y.o. 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 Facility Procedures CPT4 Code: 76195093 Description: 26712 - WOUND CARE VISIT-LEV 3 EST PT Modifier: Quantity: 1 CPT4 Code: [id number] Description: 38250 - CHEM CAUT GRANULATION TISS Modifier: Quantity: 1 CPT4 Code: Description: ICD-10 Diagnosis Description S11.80XA Unspecified open wound of other specified part of neck, initial encount Modifier: er Quantity: Physician Procedures CPT4 Code: 5397673 Description: 41937 - WC PHYS LEVEL 4 - NEW PT Modifier: Quantity: 1 CPT4 Code: Description: ICD-10 Diagnosis Description S11.80XA Unspecified open wound of other specified part of neck, initial encount E10.622 Type 1 diabetes mellitus with other skin ulcer N18.6 End stage renal disease E10.39 Type 1 diabetes mellitus with other  diabetic ophthalmic complication Modifier: er Quantity: CPT4 Code: 9024097 Description: 35329 - WC PHYS CHEM CAUT GRAN TISSUE Modifier: Quantity: 1 CPT4 Code: Description: ICD-10 Diagnosis Description S11.80XA Unspecified open wound of other specified part of neck, initial encount Modifier: er Quantity: Electronic Signature(s) Signed: 01/20/2022 11:08:22 AM By: Kalman Shan DO Entered By: Kalman Shan on 01/20/2022 11:08:02

## 2022-01-23 DIAGNOSIS — N2581 Secondary hyperparathyroidism of renal origin: Secondary | ICD-10-CM | POA: Diagnosis not present

## 2022-01-23 DIAGNOSIS — Z992 Dependence on renal dialysis: Secondary | ICD-10-CM | POA: Diagnosis not present

## 2022-01-23 DIAGNOSIS — N186 End stage renal disease: Secondary | ICD-10-CM | POA: Diagnosis not present

## 2022-01-23 LAB — AEROBIC/ANAEROBIC CULTURE W GRAM STAIN (SURGICAL/DEEP WOUND)

## 2022-01-25 DIAGNOSIS — N2581 Secondary hyperparathyroidism of renal origin: Secondary | ICD-10-CM | POA: Diagnosis not present

## 2022-01-25 DIAGNOSIS — N186 End stage renal disease: Secondary | ICD-10-CM | POA: Diagnosis not present

## 2022-01-25 DIAGNOSIS — Z992 Dependence on renal dialysis: Secondary | ICD-10-CM | POA: Diagnosis not present

## 2022-01-26 DIAGNOSIS — Z5181 Encounter for therapeutic drug level monitoring: Secondary | ICD-10-CM | POA: Diagnosis not present

## 2022-01-26 DIAGNOSIS — N2581 Secondary hyperparathyroidism of renal origin: Secondary | ICD-10-CM | POA: Diagnosis not present

## 2022-01-26 DIAGNOSIS — Z7901 Long term (current) use of anticoagulants: Secondary | ICD-10-CM | POA: Diagnosis not present

## 2022-01-26 DIAGNOSIS — N186 End stage renal disease: Secondary | ICD-10-CM | POA: Diagnosis not present

## 2022-01-26 DIAGNOSIS — Z992 Dependence on renal dialysis: Secondary | ICD-10-CM | POA: Diagnosis not present

## 2022-01-27 ENCOUNTER — Encounter (HOSPITAL_BASED_OUTPATIENT_CLINIC_OR_DEPARTMENT_OTHER): Payer: Medicare HMO | Admitting: Internal Medicine

## 2022-01-27 ENCOUNTER — Other Ambulatory Visit: Payer: Self-pay | Admitting: Internal Medicine

## 2022-01-27 DIAGNOSIS — Z0000 Encounter for general adult medical examination without abnormal findings: Secondary | ICD-10-CM | POA: Diagnosis not present

## 2022-01-27 DIAGNOSIS — S1180XA Unspecified open wound of other specified part of neck, initial encounter: Secondary | ICD-10-CM

## 2022-01-27 DIAGNOSIS — E10622 Type 1 diabetes mellitus with other skin ulcer: Secondary | ICD-10-CM

## 2022-01-27 DIAGNOSIS — E1022 Type 1 diabetes mellitus with diabetic chronic kidney disease: Secondary | ICD-10-CM | POA: Diagnosis not present

## 2022-01-27 DIAGNOSIS — E1151 Type 2 diabetes mellitus with diabetic peripheral angiopathy without gangrene: Secondary | ICD-10-CM | POA: Diagnosis not present

## 2022-01-27 DIAGNOSIS — E1040 Type 1 diabetes mellitus with diabetic neuropathy, unspecified: Secondary | ICD-10-CM | POA: Diagnosis not present

## 2022-01-27 DIAGNOSIS — E113593 Type 2 diabetes mellitus with proliferative diabetic retinopathy without macular edema, bilateral: Secondary | ICD-10-CM | POA: Diagnosis not present

## 2022-01-27 DIAGNOSIS — Z794 Long term (current) use of insulin: Secondary | ICD-10-CM | POA: Diagnosis not present

## 2022-01-27 DIAGNOSIS — Z1389 Encounter for screening for other disorder: Secondary | ICD-10-CM | POA: Diagnosis not present

## 2022-01-27 DIAGNOSIS — N186 End stage renal disease: Secondary | ICD-10-CM | POA: Diagnosis not present

## 2022-01-27 DIAGNOSIS — Z992 Dependence on renal dialysis: Secondary | ICD-10-CM | POA: Diagnosis not present

## 2022-01-27 DIAGNOSIS — E1039 Type 1 diabetes mellitus with other diabetic ophthalmic complication: Secondary | ICD-10-CM

## 2022-01-27 DIAGNOSIS — T148XXA Other injury of unspecified body region, initial encounter: Secondary | ICD-10-CM

## 2022-01-27 DIAGNOSIS — E1122 Type 2 diabetes mellitus with diabetic chronic kidney disease: Secondary | ICD-10-CM | POA: Diagnosis not present

## 2022-01-27 DIAGNOSIS — I120 Hypertensive chronic kidney disease with stage 5 chronic kidney disease or end stage renal disease: Secondary | ICD-10-CM | POA: Diagnosis not present

## 2022-01-27 DIAGNOSIS — E221 Hyperprolactinemia: Secondary | ICD-10-CM | POA: Diagnosis not present

## 2022-01-27 NOTE — Progress Notes (Addendum)
[name], RAMSBURG ([id number]) Visit Report for 01/27/2022 Chief Complaint Document Details Patient Name: [name], [name] Date of Service: 01/27/2022 8:00 AM Medical Record Number: [id number] Patient Account Number: [id number] Date of Birth/Sex: Apr 20, 1971 (51 y.o. M) Treating RN: Alycia Rossetti Primary Care Provider: Thereasa Distance Other Clinician: Referring Provider: Thereasa Distance Treating Provider/Extender: Yaakov Guthrie in Treatment: 1 Information Obtained from: Patient Chief Complaint 01/20/22; wound to previous central line catheter to the right side of the neck Electronic Signature(s) Signed: 01/27/2022 11:38:35 AM By: Kalman Shan DO Entered By: Kalman Shan on 01/27/2022 09:26:14 [name], [name] ([id number]) -------------------------------------------------------------------------------- HPI Details Patient Name: [name], [name] Date of Service: 01/27/2022 8:00 AM Medical Record Number: [id number] Patient Account Number: [id number] Date of Birth/Sex: 1971-04-17 (51 y.o. M) Treating RN: Alycia Rossetti Primary Care Provider: Thereasa Distance Other Clinician: Referring Provider: Thereasa Distance Treating Provider/Extender: Yaakov Guthrie in Treatment: 1 History of Present Illness HPI Description: 12/28/17; this is a now 51 year old man who is a type II diabetic. He was hospitalized from 10/01/17 through 10/19/17. He had an MSSA soft tissue and skin infection. 2 open areas on the left leg were identified he has a smaller area on the left medial calf superiorly just below the knee and a wound just above the left ankle on the posterior medial aspect. I think both of these were surgical IandD sites when he was in the hospital. He was discharged with a wound VAC at that point however this is since been taken off. He follows with Dr. Ola Spurr for the [facility] and he is still on chronic Keflex at 500 twice a day. At that time he was hospitalized his hemoglobin A1c was 15.1  however if I'm reading his endocrinologist notes correctly that is improved. He has been following with Dr. [name] at [facility] vascular and he has been applying calcium alginate and Unna boots. He has home health changing the dressing. They have also been attempting to get him external compression pumps although the patient is unaware whether they've been approved by insurance at this point. as mentioned he has a smaller clean wound on the right lateral calf just below the knee and he has a much larger area just above the left ankle medially and posteriorly. Our intake nurse reported greenish purulent looking drainage.the patient did have surgical material sent to pathology in February. This showed chronic abscess The patient also has lymphedema stage III in the left greater than right lower extremities. He has a history of blisters with wounds but these of all were always healed. The patient thinks that the lymphedema may have been present since he was about 51 years old i.e. about 30 years. He does not have graded pressure stockings and has not worn stockings. He does not have a distant history of DVT PE or phlebitis. He has not been systemically unwell fever no chills. He states that his Lasix is recently been reduced. He tells me his kidney function is at "30%" and he has been followed by Dr. [name] of nephrology. At one point he was on Lasix 80 twice a day however that's been cut back and he is now on Lasix at 20 twice a day. The patient has a history of PAD listed in his records although he comes from Dr. [name] I don't think is felt to have significant PAD. ABIs in our clinic were noncompressible bilaterally. 01/04/18; patient has a large wound on the left lateral lower calf and a small wound on the left medial  upper calf. He has been to see his nephrologist who changed him to Demadex 40 mg a day. I'm hopeful this will help with his systemic fluid overload. He also has stage III lymphedema.  Really no change in the 2 wounds since last week 01/11/18; the patient is down 13 pounds. He put his stage III lymphedema left leg in 4K compression last week and there is less edema fluid however we still haven't been [name] to communicate with home health but apparently it is [facility] but the dressings have not been changed. The patient noted an odor last week. He is also had compression pumps ordered by [facility] this as a not completed the paperwork stage. 01/18/18; patient continues to lose weight. Stage III lymphedema in the left greater than right leg under for alert compression. The major wound is on the left lateral ankle area. He apparently has bilateral compression pumps being brought to his house, these were ordered by [facility] Notable for the fact today he had some blisters on the right anterior leg together with some skin nodules. This is no doubt secondary to severe lymphedema. 01/25/18; the patient has obtained his compression pumps and is using them per vein and vascular instructions 3 times a day for an hour. He also saw [name] of vein and vascular. He was felt to have venous insufficiency but did not suggest any intervention also improved edema. It was suggested that he have compression stockings 20-30 mm on a daily basis in addition to compression pumps. The patient arrives in clinic today with a layer of unna under for layer compression. he seems to have some trouble with the degree of compression. He has open areas on the left lateral ankle area which is his major wound left upper medial calf and a superficial open area on the right anterior shin area which was blistered last week. He has skin changes on the right anterior calf which I think are no doubt secondary to lymphedema skin nodules etc. 02/01/18; the patient comes in telling us his nephrologist have to his torsemide. Unfortunately today he is put on 7 pounds by our scales. He has blisters  all over the anterior and medial part of his right calf and a new open wound. He also has soupy green drainage coming out of the left lateral calf /ankle wound. 02/08/18; culture I did last week of the left lateral ankle wound grew both Pseudomonas and Morganella. He is on Keflex from Dr. Ola Spurr in the hospital. I will need to review these notes.in any case Keflex is not going to cover these 2 organisms. I'm probably going to added ciprofloxacin today for 1 week. A lot of drainage that looks purulent last week. He is not complaining of pain however he has managed to put on 10 pounds in 2 weeks by our scales in this clinic. He is going to see his nephrologist tomorrow 02/15/18; he completed the ciprofloxacin I gave him last week. Notable that he is up to 379 pounds today which is up 16 pounds from 2 weeks ago. He is complaining of orthopnea but doesn't have any chest pain. 02/22/18; he continues to have weight gain. R intake nurse reports again purulent green drainage coming out of the lateral wound on the lateral left calf. He has small open area on the right anterior leg. His torsemide was increased to 2 tablets a day I believe this is 40 mg last week in response to the call admitted to  Dr. Keturah Barre office. He follows up with Dr. [name] and Dr. Delana Meyer tomorrow 03/01/18 his weight essentially stable today at 383 pounds. Drainage out of the left lateral wound on the lower left calf/ankle is a lot less. Culture last time grew Pseudomonas. I put him on cefdinir. He has been to Dr. Keturah Barre office no adjustments in his diuretics. Dr. Nicoletta Dress prescribed a wraparound stocking for the right leg.there is no open area on the right leg. He has a superficial area on the left medial calf, left posterior calf and in the large area on the left lateral however this looks better 03/15/18; weight is not up to 393 pounds. He saw his nephrologist yesterday Dr. [name] will increase the Demadex I'm hopeful this will help  with the edema control. I'm using silver alginate to all his wounds. In particular the left lateral ankle looks better. oUnfortunately he has new open areas on the right lateral calf that will include use of his compression stocking at least in the short to medium term. He has new wounds o3 on the right lateral calf. One of these has some size however all numerous superficial 03/22/18; his weight is stabilized a bit. Just adjustment of his diuretics by his nephrologist. There is no doubt he has some degree of systemic fluid overload on top of severe left greater than right lymphedema. Treinen, Smith A. ([id number]) 2 weeks ago tried to transition him to stockings on the right leg however he developed re-breakdown of skin on the right leg and we had to put him back in compression last week. He also uses external compression pumps and claims to be compliant Continued concern about his depression today 03/29/18; several ongoing issues with this patient; oHe no longer has home health coverage apparently secondary to a lapse in insurance. He is apparently transitioning from short for long-term disability. [facility] was changing his compression wraps on Monday and Friday. oHe has gained 10 pounds since last week oSees Dr. [name] tomorrow oSaw his primary doctor last week about the depression. It sounds as though he declined pharmacologic management. He seems somewhat better today. We were concerned last week when he came. Somewhat better today oHe is using his compression pumps once a day at home, I have asked for twice a day if possible especially on the left leg oFollows up with his nephrologist in mid-August. He is managing his diuretic for I think stage IV chronic renal failure oParadoxically his wounds actually look better 04/19/18; the patient has not been seen since I last saw him 3 weeks ago. He saw Dr. Delana Meyer of vascular surgery on 03/30/18 I believe he put him in a 20/30 stocking bilaterally  with a wraparound extremitease stocking. He has not been putting anything specifically on the wound. More problematic than that he has not been wearing the stockings he is at home. He has been using his external compression pumps once per day according to him on a rare occasion twice On a psychosocial level the patient is now on long-term disability and is applying for COBRA therefore he is between insurances. He has not been [name] to follow up with Dr. [name] who is his nephrologist as a result. As noted his weight is up to 407 pounds today. He promises me he'll follow-up with Dr. [name] 05/03/18; he hasn't been here in 2 weeks now. He apparently has been wearing a compression sock on the left leg. Massive increase in edema 3 large open wounds on the left anterior leg  that were probably blisters. Significant deterioration in the left lateral ankle wound that we've been doing as his most problematic wound. He still does not have his insurance issues wrapped up. His weight is well over 400 pounds. 05/10/18; arrives today with better looking edema control in the left leg. He has been using his compression pumps twice a day. We also wrapped it is left leg for there he's been using his pumps so there is much better edema control. Most of new wounds from last week look a lot better. Even the refractory area on the lower left lateral ankle looks a lot better to me today. He follows up with Dr. [name] this morning [nephrology] 05/17/18 patient arrives today with a lot less edema in the left leg. His weight is gone down 7 pounds. He tells me he saw Dr. [name] but his torsemide was not adjusted. He is using the palms 3 times a day. There is been quite an improvement in the remaining wound on the left lateral ankle oWeekly visit for follow-up of bilateral lower extremity wounds related to severe lymphedema and probably some degree of systemic fluid overload from chronic renal failure stage IV. His weight is up this  week to over 400 pounds. He noted increasing edema in the right leg late last week. He took his compression stocking off he did not increase the frequency of this compression pump use. He developed a large blister on the back of the right calf. He also has a new opened blister on the left lateral calf in addition to the wound that we've been using on the distal left lower calf area and we've been using silver alginate. He tells me that he has an ultrasound which is a DVT rule out and I think a reflux study ordered by Dr. [name] 05/31/18; weekly visit. He went to see vein and vascular this week apparently they remove the 4-layer compression on the left and put an Unna boot on him in replacement. He's got more swelling in the left leg is resolved. That being said his left lower Wound is better. He still has a fairly large wound on the right posterior calf this was not disturbed. He has a follow-up appointment with Dr. Keturah Barre nephrologist next week 06/07/2018; the patient arrives today with a history that he took both his compression wraps off 3 days ago in order to take a shower. He has bilateral severe tense blisters. A lot of weeping erythema especially on the lateral left calf. Almost circumferential blisters on the right. Multiple areas of epithelial breakdown. He does not complain of any pain fever chills. He states he has been using his compression pump in some form of stocking that I could not really determine the type. He did not come into the clinic with anything on his legs. He tells me he has an appointment with Dr. Merita Norton his nephrologist I believe this Friday. Paradoxically his weight is actually down to 385 pounds I believe last week he was over 400 06/14/18; arrives with better looking wound surfaces today on both legs. There is no further blistering however there are still areas that aren't epithelialized on the right anterior, right lateral, left posterior and left medial. His original  wound just above the left ankle laterally is still open moist. His weight was about the same today. He tells me that his nephrologist increase the torsemide from 40/20/09/1939/40 twice a day 06/21/18; both his wraps fell down to his mid calf. Has a result he  has multiple blisters across the anterior right leg above with the wraps ultimately watched. He also has blisters on the posterior part of the left calf o2. His original wound on the left lateral calf is hard to see any open area. There is however a divot. Which is going to be difficult to deal with into the future until the edema in the left leg is controlled. 2 small superficial areas remain He tells [place] his weight was 386 pounds on his scale at home. According to our scale he is up 5 pounds. He is not on a fluid restriction 06/28/18; patient's weight is gone up 2 pounds since last time. He has weeping edema and open superficial wounds on the right posterior right lateral and right anterior calf. He has a small open area on the left anterior probably left posterior calf and the original wound on the left lateral calf appears to be just about closed He is being planned for a dialysis shunt in the right arm through [facility] incoordination with his nephrologist Dr. [name] He claims to be using his compression pumps twice a day. He has lymphedema and no doubt systemic fluid volume overload from stage IV chronic renal failure 07/04/18; patient's weight is up into the 396 range. He is supposed to see his cardiologist tomorrow and plans are being made for a shunt by Dr. Delana Meyer. He still has significant open wounds/draining areas on the right anterior and right posterior calf. Several small open areas on the left which are less in terms of wound area on the right which is surprising given the fact the left is the larger most lymphedematous leg 07/26/2018 She is seen today for follow-up and management of lateral posterior lower leg wound and  bilateral lymphoedema. He has a very flat affect and depressed mood today. Unable to elicit much information from him today. No thoughts of harm to self identified. Recently had a follow- up with vascular vein for fistula placement to initiate dialysis in the right arm. He has 2 dressings on the right arm from the fistula procedure with no bleeding or drainage noted on the dressing. He does have a large amount of bruising in the surrounding to puncture areas on the right lower arm from the fistula placement. No pain elicited with palpation of the right arm. He denies a diminished sensation in that right lower arm as well. He does have a small wound to the left lateral posterior lower leg. No visual drainage present to either leg. However the wound to the left lower leg does have a foul odor even after cleaning it. The left is the larger most lymphedematous leg with the right Leg still having a significant amount of edema. No drainage or blisters present on either leg today. He states that he is using the lymphedema pumps twice a day. Not too sure if he is compliant with actually using the lymphedema pumps based on the amount of edema that he has in his legs. Weight increased from 396.1 to 401.6. No recent fevers, chills, or shortness of breath. [name], [name] ([id number]) 08/02/18; the patient only has one remaining wound on the left lateral calf which is still open. This is in the resultant [place] shaped area which was once the site of the major wound. He does not have any open areas on the right leg nor additional wounds on the left no blistering. As usual the left leg is much larger than the right. He comes in today stating  that he took the wraps off on the right leg on Friday and since then he has presumably been wearing his stocking which is a wraparound variant stocking on the right. States he took the 4-layer compression off his left leg this morning to shower. He is seeing vein and vascular  tomorrow. He states he is using his compression pumps once or twice a day. He also admits that he has been on his feet a lot 08/09/18; the patient has the left leg healed except for the original wound site on the left medial Just above the ankle. On the right he has a new wound on the right lateral calf. He saw vein and vascular last week and he is been back in his pressure stockings and wraparound stockings. He also is been put on metolazone 3 times a week by nephrology along with his torsemide. I'm hopeful that this will help with some of the lower extremity edema. I've always felt that he has lymphedema but there may be a secondary component contributing 08/16/18; the patient used his own junk slight stockings to both legs last week. He claims he is pumping once to twice a day at home. Here rise with his legs looking visibly less edematous. His usual left greater than right secondary to lymphedema. He is tolerating the metolazone 3 times a week along with the torsemide. His weight was down 2 pounds. He follows up with nephrology soon for follow-up lab work I think in early January. He does not have any open areas on the right leg he still has the crevice on the lateral left calf just above the ankle there is 2 small open areas here I am not sure that this is ever going to be free of an open area although certainly not worsening. More problematic lead he has 2 blistered areas just above the wrist on the left lateral calf 09/06/2018; the patient arrives with much larger legs than I remember seeing a month ago especially on the right. He has 1 open area on the left lateral calf and he had a 4 layer compression on this on arrival in the clinic. This was apparently put on by [facility] in response to a new open wound. He claims he is pumping once or more often twice a day although I really have a hard time believing it. He came in with nothing but stockings on his legs. There is tremendous increase in the  edema bilaterally 1/22; the patient only has the one area that is not totally closed and that is the divot on the left lateral calf. I do not think we are going to be [name] to do anything further to this unless he loses edema fluid in his left leg when he starts dialysis which I gather is sometime soon. He is using his own wraparound compression garment. He is using the compression pumps twice a day. 2/19; the patient does not have any open wounds on the right leg but both legs left and the right have extensive edema is usually worse on the left. He has a tense blister on the left anterior leg. Using his compression pumps usually once a day per his description. He does not come in with the compression stockings 2/26. Right leg remains without any open wounds that he has his own stocking. The tense blister on the left anterior leg is closed over. He still has the open area on the left lateral mid calf and then the divot injury area which I  do not think is going to heal just above the left ankle. 3/11; the patient arrives with all the wounds on both legs healed. He has his stocking with the wraparound juxta lite on the right. He has 1 for the left. He initiated dialysis on Monday and we will see what effect that has on his lower extremity edema. He has his compression pumps and he plans to use them at least once a day Readmission 01/20/2022 Mr. Costlow Leard is a 51 year old male with a past medical history of type 1 diabetes on hemodialysis that presents to the clinic for a 1 year history of wound to the right side of his neck. He states that he had a central line in place when he started hemodialysis while his graft matured. He states that the area where the line was removed never healed. He reports serosanguineous drainage and odor to the area over the past year. He has not taken antibiotics for this issue. He currently denies systemic signs of infection. He currently keeps the area covered with a  Band-Aid. 5/31; patient presents for follow-up. He has been taking doxycycline without issues. He had a culture done at last clinic visit that showed abundant Streptococcus anginosus sensitive to penicillin. I will go ahead and switch him to Augmentin. He has been using mupirocin ointment to the wound bed and covering this with Hydrofera Blue. Upon inspection today there is a piece of dressing tightly adhered in the wound bed that appears to be connected to underlying structures. He obtains his neck ultrasound tomorrow. He denies systemic signs of infection. Electronic Signature(s) Signed: 01/27/2022 11:38:35 AM By: Kalman Shan DO Entered By: Kalman Shan on 01/27/2022 09:27:25 [name], [name] AMarland Kitchen ([id number]) -------------------------------------------------------------------------------- Physical Exam Details Patient Name: [name], [name] Date of Service: 01/27/2022 8:00 AM Medical Record Number: [id number] Patient Account Number: [id number] Date of Birth/Sex: [birth year] (51 y.o. M) Treating RN: Alycia Rossetti Primary Care Provider: Thereasa Distance Other Clinician: Referring Provider: Thereasa Distance Treating Provider/Extender: Yaakov Guthrie in Treatment: 1 Constitutional . Psychiatric . Notes Right side of the neck: Open wound with granulation tissue at the opening and foreign body of what appears to be a very old dressing protruding out of the wound bed. This is tightly adhered to the underlying structure and cannot be removed. Again there is thickened serosanguineous drainage. No surrounding soft tissue infection. Electronic Signature(s) Signed: 01/27/2022 11:38:35 AM By: Kalman Shan DO Entered By: Kalman Shan on 01/27/2022 09:28:35 [name], [name] Flack ([id number]) -------------------------------------------------------------------------------- Physician Orders Details Patient Name: [name], [name] Date of Service: 01/27/2022 8:00 AM Medical Record Number:  [id number] Patient Account Number: [id number] Date of Birth/Sex: 01-06-1971 (51 y.o. M) Treating RN: Alycia Rossetti Primary Care Provider: Thereasa Distance Other Clinician: Referring Provider: Thereasa Distance Treating Provider/Extender: Yaakov Guthrie in Treatment: 1 Verbal / Phone Orders: No Diagnosis Coding Follow-up Appointments o Return Appointment in 1 week. Bathing/ Shower/ Hygiene o May shower; gently cleanse wound with antibacterial soap, rinse and pat dry prior to dressing wounds Wound Treatment Wound #[id number] - Clavicle Wound Laterality: Right Cleanser: Soap and Water 1 x Per Day/30 Days Discharge Instructions: Gently cleanse wound with antibacterial soap, rinse and pat dry prior to dressing wounds Topical: Mupirocin Ointment 1 x Per Day/30 Days Discharge Instructions: Apply as directed by provider. Secondary Dressing: Coverlet Latex-Free Fabric Adhesive Dressings 1 x Per Day/30 Days Discharge Instructions: 1.5 x 2 Patient Medications Allergies: lisinopril, latex, tape, occlusive adhesive Notifications Medication Indication Start End Augmentin 01/27/2022 DOSE 1 - oral  500 mg-125 mg tablet - 1 tablet oral once daily x 10 days, take after dialysis on dialysis days Electronic Signature(s) Signed: 01/27/2022 11:38:35 AM By: Kalman Shan DO Previous Signature: 01/27/2022 8:57:31 AM Version By: Kalman Shan DO Entered By: Kalman Shan on 01/27/2022 09:32:13 [name], [name] ([id number]) -------------------------------------------------------------------------------- Problem List Details Patient Name: Kopera, Woodrow A. Date of Service: 01/27/2022 8:00 AM Medical Record Number: [id number] Patient Account Number: [id number] Date of Birth/Sex: 15-Oct-1970 (51 y.o. M) Treating RN: Alycia Rossetti Primary Care Provider: Thereasa Distance Other Clinician: Referring Provider: Thereasa Distance Treating Provider/Extender: Yaakov Guthrie in Treatment: 1 Active  Problems ICD-10 Encounter Code Description Active Date MDM Diagnosis S11.80XA Unspecified open wound of other specified part of neck, initial encounter 01/20/2022 No Yes E10.622 Type 1 diabetes mellitus with other skin ulcer 01/20/2022 No Yes N18.6 End stage renal disease 01/20/2022 No Yes E10.39 Type 1 diabetes mellitus with other diabetic ophthalmic complication 6/38/4665 No Yes Inactive Problems Resolved Problems Electronic Signature(s) Signed: 01/27/2022 11:38:35 AM By: Kalman Shan DO Entered By: Kalman Shan on 01/27/2022 09:26:11 Hurrell, Keino A. ([id number]) -------------------------------------------------------------------------------- Progress Note Details Patient Name: Tat, Abdulhamid A. Date of Service: 01/27/2022 8:00 AM Medical Record Number: [id number] Patient Account Number: [id number] Date of Birth/Sex: 22-Jul-1971 (51 y.o. M) Treating RN: Alycia Rossetti Primary Care Provider: Thereasa Distance Other Clinician: Referring Provider: Thereasa Distance Treating Provider/Extender: Yaakov Guthrie in Treatment: 1 Subjective Chief Complaint Information obtained from Patient 01/20/22; wound to previous central line catheter to the right side of the neck History of Present Illness (HPI) 12/28/17; this is a now 51 year old man who is a type II diabetic. He was hospitalized from 10/01/17 through 10/19/17. He had an MSSA soft tissue and skin infection. 2 open areas on the left leg were identified he has a smaller area on the left medial calf superiorly just below the knee and a wound just above the left ankle on the posterior medial aspect. I think both of these were surgical IandD sites when he was in the hospital. He was discharged with a wound VAC at that point however this is since been taken off. He follows with Dr. Ola Spurr for the [facility] and he is still on chronic Keflex at 500 twice a day. At that time he was hospitalized his hemoglobin A1c was 15.1 however if I'm reading his  endocrinologist notes correctly that is improved. He has been following with Dr. [name] at [facility] and he has been applying calcium alginate and Unna boots. He has home health changing the dressing. They have also been attempting to get him external compression pumps although the patient is unaware whether they've been approved by insurance at this point. as mentioned he has a smaller clean wound on the right lateral calf just below the knee and he has a much larger area just above the left ankle medially and posteriorly. Our intake nurse reported greenish purulent looking drainage.the patient did have surgical material sent to pathology in February. This showed chronic abscess The patient also has lymphedema stage III in the left greater than right lower extremities. He has a history of blisters with wounds but these of all were always healed. The patient thinks that the lymphedema may have been present since he was about 51 years old i.e. about 30 years. He does not have graded pressure stockings and has not worn stockings. He does not have a distant history of DVT PE or phlebitis. He has not been systemically unwell fever no chills. He states  that his Lasix is recently been reduced. He tells me his kidney function is at "30%" and he has been followed by Dr. [name] of nephrology. At one point he was on Lasix 80 twice a day however that's been cut back and he is now on Lasix at 20 twice a day. The patient has a history of PAD listed in his records although he comes from Dr. [name] I don't think is felt to have significant PAD. ABIs in our clinic were noncompressible bilaterally. 01/04/18; patient has a large wound on the left lateral lower calf and a small wound on the left medial upper calf. He has been to see his nephrologist who changed him to Demadex 40 mg a day. I'm hopeful this will help with his systemic fluid overload. He also has stage III lymphedema. Really no change in the 2  wounds since last week 01/11/18; the patient is down 13 pounds. He put his stage III lymphedema left leg in 4K compression last week and there is less edema fluid however we still haven't been [name] to communicate with home health but apparently it is [facility] but the dressings have not been changed. The patient noted an odor last week. He is also had compression pumps ordered by [facility] this as a not completed the paperwork stage. 01/18/18; patient continues to lose weight. Stage III lymphedema in the left greater than right leg under for alert compression. The major wound is on the left lateral ankle area. He apparently has bilateral compression pumps being brought to his house, these were ordered by [facility] Notable for the fact today he had some blisters on the right anterior leg together with some skin nodules. This is no doubt secondary to severe lymphedema. 01/25/18; the patient has obtained his compression pumps and is using them per vein and vascular instructions 3 times a day for an hour. He also saw [name] of vein and vascular. He was felt to have venous insufficiency but did not suggest any intervention also improved edema. It was suggested that he have compression stockings 20-30 mm on a daily basis in addition to compression pumps. The patient arrives in clinic today with a layer of unna under for layer compression. he seems to have some trouble with the degree of compression. He has open areas on the left lateral ankle area which is his major wound left upper medial calf and a superficial open area on the right anterior shin area which was blistered last week. He has skin changes on the right anterior calf which I think are no doubt secondary to lymphedema skin nodules etc. 02/01/18; the patient comes in telling us his nephrologist have to his torsemide. Unfortunately today he is put on 7 pounds by our scales. He has blisters all over the anterior and  medial part of his right calf and a new open wound. He also has soupy green drainage coming out of the left lateral calf /ankle wound. 02/08/18; culture I did last week of the left lateral ankle wound grew both Pseudomonas and Morganella. He is on Keflex from Dr. Ola Spurr in the hospital. I will need to review these notes.in any case Keflex is not going to cover these 2 organisms. I'm probably going to added ciprofloxacin today for 1 week. A lot of drainage that looks purulent last week. He is not complaining of pain however he has managed to put on 10 pounds in 2 weeks by our scales in this clinic. He  is going to see his nephrologist tomorrow 02/15/18; he completed the ciprofloxacin I gave him last week. Notable that he is up to 379 pounds today which is up 16 pounds from 2 weeks ago. He is complaining of orthopnea but doesn't have any chest pain. 02/22/18; he continues to have weight gain. R intake nurse reports again purulent green drainage coming out of the lateral wound on the lateral left calf. He has small open area on the right anterior leg. His torsemide was increased to 2 tablets a day I believe this is 40 mg last week in response to the call admitted to Dr. Keturah Barre office. He follows up with Dr. [name] and Dr. Delana Meyer tomorrow 03/01/18 his weight essentially stable today at 383 pounds. Drainage out of the left lateral wound on the lower left calf/ankle is a lot less. Culture last time grew Pseudomonas. I put him on cefdinir. He has been to Dr. Keturah Barre office no adjustments in his diuretics. Dr. Nicoletta Dress prescribed a wraparound stocking for the right leg.there is no open area on the right leg. He has a superficial area on the left medial calf, left posterior calf and in the large area on the left lateral however this looks better 03/15/18; weight is not up to 393 pounds. He saw his nephrologist yesterday Dr. [name] will increase the Demadex I'm hopeful this will help with [name], [name]  ([id number]) the edema control. I'm using silver alginate to all his wounds. In particular the left lateral ankle looks better. Unfortunately he has new open areas on the right lateral calf that will include use of his compression stocking at least in the short to medium term. He has new wounds o3 on the right lateral calf. One of these has some size however all numerous superficial 03/22/18; his weight is stabilized a bit. Just adjustment of his diuretics by his nephrologist. There is no doubt he has some degree of systemic fluid overload on top of severe left greater than right lymphedema. 2 weeks ago tried to transition him to stockings on the right leg however he developed re-breakdown of skin on the right leg and we had to put him back in compression last week. He also uses external compression pumps and claims to be compliant Continued concern about his depression today 03/29/18; several ongoing issues with this patient; He no longer has home health coverage apparently secondary to a lapse in insurance. He is apparently transitioning from short for long-term disability. [facility] was changing his compression wraps on Monday and Friday. He has gained 10 pounds since last week Sees Dr. [name] tomorrow Saw his primary doctor last week about the depression. It sounds as though he declined pharmacologic management. He seems somewhat better today. We were concerned last week when he came. Somewhat better today He is using his compression pumps once a day at home, I have asked for twice a day if possible especially on the left leg Follows up with his nephrologist in mid-August. He is managing his diuretic for I think stage IV chronic renal failure Paradoxically his wounds actually look better 04/19/18; the patient has not been seen since I last saw him 3 weeks ago. He saw Dr. Delana Meyer of vascular surgery on 03/30/18 I believe he put him in a 20/30 stocking bilaterally with a wraparound  extremitease stocking. He has not been putting anything specifically on the wound. More problematic than that he has not been wearing the stockings he is at home. He has been using  his external compression pumps once per day according to him on a rare occasion twice On a psychosocial level the patient is now on long-term disability and is applying for COBRA therefore he is between insurances. He has not been [name] to follow up with Dr. [name] who is his nephrologist as a result. As noted his weight is up to 407 pounds today. He promises me he'll follow-up with Dr. [name] 05/03/18; he hasn't been here in 2 weeks now. He apparently has been wearing a compression sock on the left leg. Massive increase in edema 3 large open wounds on the left anterior leg that were probably blisters. Significant deterioration in the left lateral ankle wound that we've been doing as his most problematic wound. He still does not have his insurance issues wrapped up. His weight is well over 400 pounds. 05/10/18; arrives today with better looking edema control in the left leg. He has been using his compression pumps twice a day. We also wrapped it is left leg for there he's been using his pumps so there is much better edema control. Most of new wounds from last week look a lot better. Even the refractory area on the lower left lateral ankle looks a lot better to me today. He follows up with Dr. [name] this morning [nephrology] 05/17/18 patient arrives today with a lot less edema in the left leg. His weight is gone down 7 pounds. He tells me he saw Dr. [name] but his torsemide was not adjusted. He is using the palms 3 times a day. There is been quite an improvement in the remaining wound on the left lateral ankle Weekly visit for follow-up of bilateral lower extremity wounds related to severe lymphedema and probably some degree of systemic fluid overload from chronic renal failure stage IV. His weight is up this week to over 400  pounds. He noted increasing edema in the right leg late last week. He took his compression stocking off he did not increase the frequency of this compression pump use. He developed a large blister on the back of the right calf. He also has a new opened blister on the left lateral calf in addition to the wound that we've been using on the distal left lower calf area and we've been using silver alginate. He tells me that he has an ultrasound which is a DVT rule out and I think a reflux study ordered by Dr. [name] 05/31/18; weekly visit. He went to see vein and vascular this week apparently they remove the 4-layer compression on the left and put an Unna boot on him in replacement. He's got more swelling in the left leg is resolved. That being said his left lower Wound is better. He still has a fairly large wound on the right posterior calf this was not disturbed. He has a follow-up appointment with Dr. Keturah Barre nephrologist next week 06/07/2018; the patient arrives today with a history that he took both his compression wraps off 3 days ago in order to take a shower. He has bilateral severe tense blisters. A lot of weeping erythema especially on the lateral left calf. Almost circumferential blisters on the right. Multiple areas of epithelial breakdown. He does not complain of any pain fever chills. He states he has been using his compression pump in some form of stocking that I could not really determine the type. He did not come into the clinic with anything on his legs. He tells me he has an appointment with Dr. Merita Norton his  nephrologist I believe this Friday. Paradoxically his weight is actually down to 385 pounds I believe last week he was over 400 06/14/18; arrives with better looking wound surfaces today on both legs. There is no further blistering however there are still areas that aren't epithelialized on the right anterior, right lateral, left posterior and left medial. His original wound just above the  left ankle laterally is still open moist. His weight was about the same today. He tells me that his nephrologist increase the torsemide from 40/20/09/1939/40 twice a day 06/21/18; both his wraps fell down to his mid calf. Has a result he has multiple blisters across the anterior right leg above with the wraps ultimately watched. He also has blisters on the posterior part of the left calf o2. His original wound on the left lateral calf is hard to see any open area. There is however a divot. Which is going to be difficult to deal with into the future until the edema in the left leg is controlled. 2 small superficial areas remain He tells [place] his weight was 386 pounds on his scale at home. According to our scale he is up 5 pounds. He is not on a fluid restriction 06/28/18; patient's weight is gone up 2 pounds since last time. He has weeping edema and open superficial wounds on the right posterior right lateral and right anterior calf. He has a small open area on the left anterior probably left posterior calf and the original wound on the left lateral calf appears to be just about closed He is being planned for a dialysis shunt in the right arm through [facility] incoordination with his nephrologist Dr. [name] He claims to be using his compression pumps twice a day. He has lymphedema and no doubt systemic fluid volume overload from stage IV chronic renal failure 07/04/18; patient's weight is up into the 396 range. He is supposed to see his cardiologist tomorrow and plans are being made for a shunt by Dr. Delana Meyer. He still has significant open wounds/draining areas on the right anterior and right posterior calf. Several small open areas on the left which are less in terms of wound area on the right which is surprising given the fact the left is the larger most lymphedematous leg 07/26/2018 She is seen today for follow-up and management of lateral posterior lower leg wound and bilateral lymphoedema.  He has a very flat affect and depressed mood today. Unable to elicit much information from him today. No thoughts of harm to self identified. Recently had a follow- up with vascular vein for fistula placement to initiate dialysis in the right arm. He has 2 dressings on the right arm from the fistula procedure with no bleeding or drainage noted on the dressing. He does have a large amount of bruising in the surrounding to puncture areas on the right lower arm from the fistula placement. No pain elicited with palpation of the right arm. He denies a diminished sensation in that right lower arm as well. [name], [name] ([id number]) He does have a small wound to the left lateral posterior lower leg. No visual drainage present to either leg. However the wound to the left lower leg does have a foul odor even after cleaning it. The left is the larger most lymphedematous leg with the right Leg still having a significant amount of edema. No drainage or blisters present on either leg today. He states that he is using the lymphedema pumps twice a day. Not too  sure if he is compliant with actually using the lymphedema pumps based on the amount of edema that he has in his legs. Weight increased from 396.1 to 401.6. No recent fevers, chills, or shortness of breath. 08/02/18; the patient only has one remaining wound on the left lateral calf which is still open. This is in the resultant [place] shaped area which was once the site of the major wound. He does not have any open areas on the right leg nor additional wounds on the left no blistering. As usual the left leg is much larger than the right. He comes in today stating that he took the wraps off on the right leg on Friday and since then he has presumably been wearing his stocking which is a wraparound variant stocking on the right. States he took the 4-layer compression off his left leg this morning to shower. He is seeing vein and vascular tomorrow. He states he is  using his compression pumps once or twice a day. He also admits that he has been on his feet a lot 08/09/18; the patient has the left leg healed except for the original wound site on the left medial Just above the ankle. On the right he has a new wound on the right lateral calf. He saw vein and vascular last week and he is been back in his pressure stockings and wraparound stockings. He also is been put on metolazone 3 times a week by nephrology along with his torsemide. I'm hopeful that this will help with some of the lower extremity edema. I've always felt that he has lymphedema but there may be a secondary component contributing 08/16/18; the patient used his own junk slight stockings to both legs last week. He claims he is pumping once to twice a day at home. Here rise with his legs looking visibly less edematous. His usual left greater than right secondary to lymphedema. He is tolerating the metolazone 3 times a week along with the torsemide. His weight was down 2 pounds. He follows up with nephrology soon for follow-up lab work I think in early January. He does not have any open areas on the right leg he still has the crevice on the lateral left calf just above the ankle there is 2 small open areas here I am not sure that this is ever going to be free of an open area although certainly not worsening. More problematic lead he has 2 blistered areas just above the wrist on the left lateral calf 09/06/2018; the patient arrives with much larger legs than I remember seeing a month ago especially on the right. He has 1 open area on the left lateral calf and he had a 4 layer compression on this on arrival in the clinic. This was apparently put on by [facility] in response to a new open wound. He claims he is pumping once or more often twice a day although I really have a hard time believing it. He came in with nothing but stockings on his legs. There is tremendous increase in the edema bilaterally 1/22; the  patient only has the one area that is not totally closed and that is the divot on the left lateral calf. I do not think we are going to be [name] to do anything further to this unless he loses edema fluid in his left leg when he starts dialysis which I gather is sometime soon. He is using his own wraparound compression garment. He is using the compression pumps twice  a day. 2/19; the patient does not have any open wounds on the right leg but both legs left and the right have extensive edema is usually worse on the left. He has a tense blister on the left anterior leg. Using his compression pumps usually once a day per his description. He does not come in with the compression stockings 2/26. Right leg remains without any open wounds that he has his own stocking. The tense blister on the left anterior leg is closed over. He still has the open area on the left lateral mid calf and then the divot injury area which I do not think is going to heal just above the left ankle. 3/11; the patient arrives with all the wounds on both legs healed. He has his stocking with the wraparound juxta lite on the right. He has 1 for the left. He initiated dialysis on Monday and we will see what effect that has on his lower extremity edema. He has his compression pumps and he plans to use them at least once a day Readmission 01/20/2022 Mr. Costlow Ran is a 51 year old male with a past medical history of type 1 diabetes on hemodialysis that presents to the clinic for a 1 year history of wound to the right side of his neck. He states that he had a central line in place when he started hemodialysis while his graft matured. He states that the area where the line was removed never healed. He reports serosanguineous drainage and odor to the area over the past year. He has not taken antibiotics for this issue. He currently denies systemic signs of infection. He currently keeps the area covered with a Band-Aid. 5/31; patient presents  for follow-up. He has been taking doxycycline without issues. He had a culture done at last clinic visit that showed abundant Streptococcus anginosus sensitive to penicillin. I will go ahead and switch him to Augmentin. He has been using mupirocin ointment to the wound bed and covering this with Hydrofera Blue. Upon inspection today there is a piece of dressing tightly adhered in the wound bed that appears to be connected to underlying structures. He obtains his neck ultrasound tomorrow. He denies systemic signs of infection. Objective Constitutional Vitals Time Taken: 8:20 AM, Height: 76 in, Source: Stated, Weight: 404 lbs, BMI: 49.2, Temperature: 98.4 F, Pulse: 73 bpm, Respiratory Rate: 16 breaths/min, Blood Pressure: 171/60 mmHg. General Notes: Right side of the neck: Open wound with granulation tissue at the opening and foreign body of what appears to be a very old dressing protruding out of the wound bed. This is tightly adhered to the underlying structure and cannot be removed. Again there is thickened serosanguineous drainage. No surrounding soft tissue infection. Integumentary (Hair, Skin) Wound #19 status is Open. Original cause of wound was Surgical Injury. The date acquired was: 08/30/2020. The wound has been in treatment 1 weeks. The wound is located on the Right Clavicle. The wound measures 0.6cm length x 0.4cm width x 0.1cm depth; 0.188cm^2 area and 0.019cm^3 volume. There is a medium amount of serosanguineous drainage noted. There is large (67-100%) red granulation within the wound bed. There is a small (1-33%) amount of necrotic tissue within the wound bed including Adherent Slough. [name], [name] 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 He has his ultrasound tomorrow. It appears that there is a foreign body tightly adhered to the wound bed and underlying structures. He has his ultrasound tomorrow. The old dressing is likely the reason why the wound has not closed in over a year. We will go ahead and refer him back to vein and vascular to take a closer look to this area. Our office will call to help facilitate an appointment. Plan Follow-up Appointments: Return Appointment in 1 week. Bathing/ Shower/ Hygiene: May shower; gently cleanse wound with antibacterial soap, rinse and pat dry prior to dressing wounds The following medication(s) was prescribed: Augmentin oral 500 mg-125 mg tablet 1 1 tablet oral once daily x 10 days, take after dialysis on dialysis days starting 01/27/2022 WOUND #19: - Clavicle Wound Laterality: Right Cleanser: Soap and Water 1 x Per Day/30 Days Discharge Instructions: Gently cleanse wound with antibacterial soap, rinse and pat dry prior to dressing wounds Topical: Mupirocin Ointment 1 x Per Day/30 Days Discharge Instructions: Apply as directed by provider. Secondary Dressing: Coverlet Latex-Free Fabric Adhesive Dressings 1 x Per Day/30 Days Discharge Instructions: 1.5 x 2 1. Continue mupirocin ointment 2. Augmentin 3. Follow-up in 1 week 4. Follow-up with vein and vascular Electronic Signature(s) Signed: 01/27/2022 11:38:35 AM By: Kalman Shan DO Entered By: Kalman Shan on 01/27/2022 09:31:45 [name], [name] ([id number]) -------------------------------------------------------------------------------- SuperBill Details Patient Name: Dwyer, Indy A. Date of Service: 01/27/2022 Medical Record Number: [id number] Patient Account Number: [id number] Date of Birth/Sex: 07-Jul-1971 (51 y.o. 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 Facility Procedures CPT4 Code: [id number] Description: 99213 - WOUND CARE VISIT-LEV 3 EST PT Modifier: Quantity: 1 Physician Procedures CPT4 Code: 6295284 Description: 13244 - WC PHYS LEVEL 4 - EST PT Modifier: Quantity: 1 CPT4 Code: Description: ICD-10 Diagnosis Description S11.80XA Unspecified open wound of other specified part of neck, initial encoun E10.622 Type 1 diabetes mellitus with other skin ulcer N18.6 End stage renal disease E10.39 Type 1 diabetes mellitus with other  diabetic ophthalmic complication Modifier: ter Quantity: Electronic Signature(s) Signed: 01/27/2022 11:38:35 AM By: Kalman Shan DO Entered By: Kalman Shan on 01/27/2022 09:32:04

## 2022-01-27 NOTE — Progress Notes (Signed)
TERRAN, KLINKE ([id number]) Visit Report for 01/27/2022 Arrival Information Details Patient Name: [name], [name] Date of Service: 01/27/2022 8:00 AM Medical Record Number: [id number] Patient Account Number: [id number] Date of Birth/Sex: 1971/06/22 (51 y.o. M) Treating RN: Alycia Rossetti Primary Care Daisi Kentner: Thereasa Distance Other Clinician: Referring Aarib Pulido: Thereasa Distance Treating Javon Snee/Extender: Yaakov Guthrie in Treatment: 1 Visit Information History Since Last Visit Added or deleted any medications: No Patient Arrived: Ambulatory Any new allergies or adverse reactions: No Arrival Time: 08:05 Hospitalized since last visit: No Accompanied By: Self Has Dressing in Place as Prescribed: Yes Transfer Assistance: None Pain Present Now: No Patient Requires Transmission-Based No Precautions: Patient Has Alerts: Yes Patient Alerts: Patient on Blood Thinner DIABETIC Electronic Signature(s) Signed: 01/27/2022 4:45:49 PM By: Alycia Rossetti Entered By: Alycia Rossetti on 01/27/2022 08:20:59 [name], [name] ([id number]) -------------------------------------------------------------------------------- Clinic Level of Care Assessment Details Patient Name: [name], [name] Date of Service: 01/27/2022 8:00 AM Medical Record Number: [id number] Patient Account Number: [id number] Date of Birth/Sex: 10-22-1970 (51 y.o. M) Treating RN: Alycia Rossetti Primary Care Resa Rinks: Thereasa Distance Other Clinician: Referring Clever Geraldo: Thereasa Distance Treating Josefina Rynders/Extender: Yaakov Guthrie in Treatment: 1 Clinic Level of Care Assessment Items TOOL 4 Quantity Score _0  - Use when only an EandM is performed on FOLLOW-UP visit 0 ASSESSMENTS - Nursing Assessment / Reassessment X - Reassessment of Co-morbidities (includes updates in patient status) 1 10 X- 1 5 Reassessment of Adherence to Treatment Plan ASSESSMENTS - Wound and Skin Assessment / Reassessment X - Simple Wound Assessment /  Reassessment - one wound 1 5 _1  - 0 Complex Wound Assessment / Reassessment - multiple wounds _2  - 0 Dermatologic / Skin Assessment (not related to wound area) ASSESSMENTS - Focused Assessment _3  - Circumferential Edema Measurements - multi extremities 0 _4  - 0 Nutritional Assessment / Counseling / Intervention _5  - 0 Lower Extremity Assessment (monofilament, tuning fork, pulses) _6  - 0 Peripheral Arterial Disease Assessment (using hand held doppler) ASSESSMENTS - Ostomy and/or Continence Assessment and Care _7  - Incontinence Assessment and Management 0 _8  - 0 Ostomy Care Assessment and Management (repouching, etc.) PROCESS - Coordination of Care X - Simple Patient / Family Education for ongoing care 1 15 _9  - 0 Complex (extensive) Patient / Family Education for ongoing care X- 1 10 Staff obtains Programmer, systems, Records, Test Results / Process Orders _10  - 0 Staff telephones HHA, Nursing Homes / Clarify orders / etc _11  - 0 Routine Transfer to another Facility (non-emergent condition) _12  - 0 Routine Hospital Admission (non-emergent condition) _13  - 0 New Admissions / Biomedical engineer / Ordering NPWT, Apligraf, etc. _14  - 0 Emergency Hospital Admission (emergent condition) X- 1 10 Simple Discharge Coordination _15  - 0 Complex (extensive) Discharge Coordination PROCESS - Special Needs _16  - Pediatric / Minor Patient Management 0 _17  - 0 Isolation Patient Management _18  - 0 Hearing / Language / Visual special needs _19  - 0 Assessment of Community assistance (transportation, D/C planning, etc.) _20  - 0 Additional assistance / Altered mentation _21  - 0 Support Surface(s) Assessment (bed, cushion, seat, etc.) INTERVENTIONS - Wound Cleansing / Measurement Buckle, [name] ([id number]) X- 1 5 Simple Wound Cleansing - one wound _22  - 0 Complex Wound Cleansing - multiple wounds X- 1 5 Wound Imaging (photographs - any number of wounds) _23  - 0 Wound Tracing (instead of  photographs) _24  - 0 Simple Wound Measurement - one wound _25  - 0 Complex Wound Measurement - multiple wounds INTERVENTIONS - Wound Dressings X - Small Wound Dressing one  or multiple wounds 1 10 _0  - 0 Medium Wound Dressing one or multiple wounds _1  - 0 Large Wound Dressing one or multiple wounds <QHKUVJDYNXGZFPOI>_5<\/PGFQMKJIZXYOFVWA>_6  - 0 Application of Medications - topical <LRJPVGKKDPTELMRA>_1<\/HHIDUPBDHDIXBOER>_8  - 0 Application of Medications - injection INTERVENTIONS - Miscellaneous _4  - External ear exam 0 _5  - 0 Specimen Collection (cultures, biopsies, blood, body fluids, etc.) _6  - 0 Specimen(s) / Culture(s) sent or taken to Lab for analysis _7  - 0 Patient Transfer (multiple staff / Harrel Lemon Lift / Similar devices) _8  - 0 Simple Staple / Suture removal (25 or less) _9  - 0 Complex Staple / Suture removal (26 or more) _10  - 0 Hypo / Hyperglycemic Management (close monitor of Blood Glucose) _11  - 0 Ankle / Brachial Index (ABI) - do not check if billed separately X- 1 5 Vital Signs Has the patient been seen at the hospital within the last three years: Yes Total Score: 80 Level Of Care: New/Established - Level 3 Electronic Signature(s) Signed: 01/27/2022 4:45:49 PM By: Alycia Rossetti Entered By: Alycia Rossetti on 01/27/2022 09:01:38 [name], [name] ([id number]) -------------------------------------------------------------------------------- Encounter Discharge Information Details Patient Name: Amick, Donny A. Date of Service: 01/27/2022 8:00 AM Medical Record Number: [id number] Patient Account Number: [id number] Date of Birth/Sex: Jul 16, 1971 (51 y.o. M) Treating RN: Alycia Rossetti Primary Care Aerionna Moravek: Thereasa Distance Other Clinician: Referring Nohelia Valenza: Thereasa Distance Treating Yazmyne Sara/Extender: Yaakov Guthrie in Treatment: 1 Encounter Discharge Information Items Discharge Condition: Stable Ambulatory Status: Ambulatory Discharge Destination: Home Transportation: Private Auto Accompanied By: self Schedule Follow-up Appointment:  No Clinical Summary of Care: Electronic Signature(s) Signed: 01/27/2022 4:45:49 PM By: Alycia Rossetti Entered By: Alycia Rossetti on 01/27/2022 09:03:44 [name], [name] ([id number]) -------------------------------------------------------------------------------- Lower Extremity Assessment Details Patient Name: [name], [name] Date of Service: 01/27/2022 8:00 AM Medical Record Number: [id number] Patient Account Number: [id number] Date of Birth/Sex: 12-Mar-1971 (51 y.o. M) Treating RN: Alycia Rossetti Primary Care Jaime Grizzell: Thereasa Distance Other Clinician: Referring Charan Prieto: Thereasa Distance Treating [place] Bryngelson/Extender: Yaakov Guthrie in Treatment: 1 Electronic Signature(s) Signed: 01/27/2022 4:45:49 PM By: Alycia Rossetti Entered By: Alycia Rossetti on 01/27/2022 08:31:48 [name], [name] ([id number]) -------------------------------------------------------------------------------- Multi Wound Chart Details Patient Name: [name], [name] Date of Service: 01/27/2022 8:00 AM Medical Record Number: [id number] Patient Account Number: [id number] Date of Birth/Sex: 1970-12-21 (51 y.o. M) Treating RN: Alycia Rossetti Primary Care Aileene Lanum: Thereasa Distance Other Clinician: Referring Katrice Goel: Thereasa Distance Treating Carey Lafon/Extender: Yaakov Guthrie in Treatment: 1 Vital Signs Height(in): 76 Pulse(bpm): 66 Weight(lbs): 404 Blood Pressure(mmHg): 171/60 Body Mass Index(BMI): 49.2 Temperature(F): 98.4 Respiratory Rate(breaths/min): 16 Photos: [N/A:N/A] Wound Location: Right Clavicle N/A N/A Wounding Event: Surgical Injury N/A N/A Primary Etiology: Dehisced Wound N/A N/A Comorbid History: Hypertension, Type II Diabetes, N/A N/A Neuropathy Date Acquired: 08/30/2020 N/A N/A Weeks of Treatment: 1 N/A N/A Wound Status: Open N/A N/A Wound Recurrence: No N/A N/A Measurements L x W x D (cm) 0.6x0.4x0.1 N/A N/A Area (cm) : 0.188 N/A N/A Volume (cm) : 0.019 N/A N/A % Reduction in Area: 52.20%  N/A N/A % Reduction in Volume: 96.50% N/A N/A Classification: Full Thickness Without Exposed N/A N/A Support Structures Exudate Amount: Medium N/A N/A Exudate Type: Serosanguineous N/A N/A Exudate Color: red, brown N/A N/A Granulation Amount: Large (67-100%) N/A N/A Granulation Quality: Red N/A N/A Necrotic Amount: Small (1-33%) N/A N/A Exposed Structures: Fascia: No N/A N/A Fat Layer (Subcutaneous Tissue): No Tendon: No Muscle: No Joint: No Bone: No Epithelialization: None N/A N/A Treatment Notes Electronic Signature(s) Signed: 01/27/2022 4:45:49 PM By: Alycia Rossetti Entered By:  Alycia Rossetti on 01/27/2022 08:32:05 [name], [name] ([id number]) -------------------------------------------------------------------------------- Multi-Disciplinary Care Plan Details Patient Name: Stencel, Tyner A. Date of Service: 01/27/2022 8:00 AM Medical Record Number: [id number] Patient Account Number: [id number] Date of Birth/Sex: Jan 26, 1971 (51 y.o. M) Treating RN: Alycia Rossetti Primary Care Allyana Vogan: Thereasa Distance Other Clinician: Referring Bethania Schlotzhauer: Thereasa Distance Treating [place]errath Madding/Extender: Yaakov Guthrie in Treatment: 1 Active Inactive Wound/Skin Impairment Nursing Diagnoses: Knowledge deficit related to ulceration/compromised skin integrity Goals: Patient/caregiver will verbalize understanding of skin care regimen Date Initiated: 01/20/2022 Date Inactivated: 01/27/2022 Target Resolution Date: 02/20/2022 Goal Status: Met Ulcer/skin breakdown will have a volume reduction of 30% by week 4 Date Initiated: 01/20/2022 Target Resolution Date: 02/20/2022 Goal Status: Active Ulcer/skin breakdown will have a volume reduction of 50% by week 8 Date Initiated: 01/20/2022 Target Resolution Date: 03/22/2022 Goal Status: Active Ulcer/skin breakdown will have a volume reduction of 80% by week 12 Date Initiated: 01/20/2022 Target Resolution Date: 04/22/2022 Goal Status: Active Ulcer/skin  breakdown will heal within 14 weeks Date Initiated: 01/20/2022 Target Resolution Date: 04/22/2022 Goal Status: Active Interventions: Assess patient/caregiver ability to obtain necessary supplies Assess patient/caregiver ability to perform ulcer/skin care regimen upon admission and as needed Assess ulceration(s) every visit Notes: Electronic Signature(s) Signed: 01/27/2022 4:45:49 PM By: Alycia Rossetti Entered By: Alycia Rossetti on 01/27/2022 09:04:13 [name], [name] ([id number]) -------------------------------------------------------------------------------- Pain Assessment Details Patient Name: [name], [name] Date of Service: 01/27/2022 8:00 AM Medical Record Number: [id number] Patient Account Number: [id number] Date of Birth/Sex: Jul 02, 1971 (51 y.o. M) Treating RN: Alycia Rossetti Primary Care Kennett Symes: Thereasa Distance Other Clinician: Referring Franklin Baumbach: Thereasa Distance Treating Jerrid Forgette/Extender: Yaakov Guthrie in Treatment: 1 Active Problems Location of Pain Severity and Description of Pain Patient Has Paino No Site Locations Pain Management and Medication Current Pain Management: Electronic Signature(s) Signed: 01/27/2022 4:45:49 PM By: Alycia Rossetti Entered By: Alycia Rossetti on 01/27/2022 08:22:08 [name], [name] ([id number]) -------------------------------------------------------------------------------- Patient/Caregiver Education Details Patient Name: [name], [name] Date of Service: 01/27/2022 8:00 AM Medical Record Number: [id number] Patient Account Number: [id number] Date of Birth/Gender: Dec 30, 1970 (51 y.o. M) Treating RN: Alycia Rossetti Primary Care Physician: Thereasa Distance Other Clinician: Referring Physician: Thereasa Distance Treating Physician/Extender: Yaakov Guthrie in Treatment: 1 Education Assessment Education Provided To: Patient Education Topics Provided Wound/Skin Impairment: Methods: Demonstration, Explain/Verbal Responses: State content  correctly Electronic Signature(s) Signed: 01/27/2022 4:45:49 PM By: Alycia Rossetti Entered By: Alycia Rossetti on 01/27/2022 09:02:12 [name], [name] ([id number]) -------------------------------------------------------------------------------- Wound Assessment Details Patient Name: [name], [name] Date of Service: 01/27/2022 8:00 AM Medical Record Number: [id number] Patient Account Number: [id number] Date of Birth/Sex: [birth year] (51 y.o. M) Treating RN: Alycia Rossetti Primary Care Isidoro Santillana: Thereasa Distance Other Clinician: Referring Ezella Kell: Thereasa Distance Treating Zamani Crocker/Extender: Yaakov Guthrie in Treatment: 1 Wound Status Wound Number: 19 Primary Etiology: Dehisced Wound Wound Location: Right Clavicle Wound Status: Open Wounding Event: Surgical Injury Comorbid History: Hypertension, Type II Diabetes, Neuropathy Date Acquired: 08/30/2020 Weeks Of Treatment: 1 Clustered Wound: No Photos Wound Measurements Length: (cm) 0.6 Width: (cm) 0.4 Depth: (cm) 0.1 Area: (cm) 0.188 Volume: (cm) 0.019 % Reduction in Area: 52.2% % Reduction in Volume: 96.5% Epithelialization: None Wound Description Classification: Full Thickness Without Exposed Support Structures Exudate Amount: Medium Exudate Type: Serosanguineous Exudate Color: red, brown Foul Odor After Cleansing: No Slough/Fibrino Yes Wound Bed Granulation Amount: Large (67-100%) Exposed Structure Granulation Quality: Red Fascia Exposed: No Necrotic Amount: Small (1-33%) Fat Layer (Subcutaneous Tissue) Exposed: No Necrotic Quality: Adherent Slough Tendon Exposed: No Muscle Exposed: No Joint Exposed: No Bone  Exposed: No Treatment Notes Wound #19 (Clavicle) Wound Laterality: Right Cleanser Soap and Water Discharge Instruction: Gently cleanse wound with antibacterial soap, rinse and pat dry prior to dressing wounds Peri-Wound Care Aitken, Chipper A. ([id number]) Topical Mupirocin Ointment Discharge Instruction: Apply  as directed by Meleni Delahunt. Primary Dressing Secondary Dressing Coverlet Latex-Free Fabric Adhesive Dressings Discharge Instruction: 1.5 x 2 Secured With Compression Wrap Compression Stockings Add-Ons Electronic Signature(s) Signed: 01/27/2022 4:45:49 PM By: Alycia Rossetti Entered By: Alycia Rossetti on 01/27/2022 08:31:34 Mccord, Karson A. ([id number]) -------------------------------------------------------------------------------- Vitals Details Patient Name: Rabenold, Makani A. Date of Service: 01/27/2022 8:00 AM Medical Record Number: [id number] Patient Account Number: [id number] Date of Birth/Sex: April 17, 1971 (51 y.o. M) Treating RN: Alycia Rossetti Primary Care Darlisha Kelm: Thereasa Distance Other Clinician: Referring Lemya Greenwell: Thereasa Distance Treating Aristotelis Vilardi/Extender: Yaakov Guthrie in Treatment: 1 Vital Signs Time Taken: 08:20 Temperature (F): 98.4 Height (in): 76 Pulse (bpm): 73 Source: Stated Respiratory Rate (breaths/min): 16 Weight (lbs): 404 Blood Pressure (mmHg): 171/60 Body Mass Index (BMI): 49.2 Reference Range: 80 - 120 mg / dl Electronic Signature(s) Signed: 01/27/2022 4:45:49 PM By: Alycia Rossetti Entered By: Alycia Rossetti on 01/27/2022 08:21:53

## 2022-01-28 ENCOUNTER — Ambulatory Visit (INDEPENDENT_AMBULATORY_CARE_PROVIDER_SITE_OTHER): Payer: Medicare HMO | Admitting: Vascular Surgery

## 2022-01-28 ENCOUNTER — Ambulatory Visit
Admission: RE | Admit: 2022-01-28 | Discharge: 2022-01-28 | Disposition: A | Discharge status: Home / Self Care | Payer: Medicare HMO | Source: Ambulatory Visit | Attending: Internal Medicine | Admitting: Internal Medicine

## 2022-01-28 ENCOUNTER — Encounter (INDEPENDENT_AMBULATORY_CARE_PROVIDER_SITE_OTHER): Payer: Self-pay | Admitting: Vascular Surgery

## 2022-01-28 VITALS — BP 176/81 | HR 84 | Resp 18 | Ht 76.0 in | Wt 394.6 lb

## 2022-01-28 DIAGNOSIS — L98499 Non-pressure chronic ulcer of skin of other sites with unspecified severity: Secondary | ICD-10-CM | POA: Diagnosis not present

## 2022-01-28 DIAGNOSIS — T148XXA Other injury of unspecified body region, initial encounter: Secondary | ICD-10-CM | POA: Diagnosis not present

## 2022-01-28 DIAGNOSIS — T829XXS Unspecified complication of cardiac and vascular prosthetic device, implant and graft, sequela: Secondary | ICD-10-CM

## 2022-01-28 DIAGNOSIS — N186 End stage renal disease: Secondary | ICD-10-CM

## 2022-01-28 DIAGNOSIS — Z992 Dependence on renal dialysis: Secondary | ICD-10-CM | POA: Diagnosis not present

## 2022-01-28 DIAGNOSIS — E10622 Type 1 diabetes mellitus with other skin ulcer: Secondary | ICD-10-CM | POA: Diagnosis not present

## 2022-01-28 DIAGNOSIS — S41001A Unspecified open wound of right shoulder, initial encounter: Secondary | ICD-10-CM | POA: Diagnosis not present

## 2022-01-28 DIAGNOSIS — E1059 Type 1 diabetes mellitus with other circulatory complications: Secondary | ICD-10-CM

## 2022-01-28 DIAGNOSIS — I10 Essential (primary) hypertension: Secondary | ICD-10-CM

## 2022-01-28 DIAGNOSIS — N2581 Secondary hyperparathyroidism of renal origin: Secondary | ICD-10-CM | POA: Diagnosis not present

## 2022-01-28 NOTE — Progress Notes (Signed)
MRN : [id number]  [name] is a 51 y.o. (1970/10/25) male who presents with chief complaint of check circulation.  History of Present Illness:   Patient returns sooner than expected because he has a wound on his right shoulder with a piece of cuff sticking out  Current Meds  Medication Sig   allopurinol (ZYLOPRIM) 100 MG tablet Take 100 mg by mouth daily.   amLODipine (NORVASC) 10 MG tablet Take 5 mg by mouth daily as needed (if bp is 190/90 or higher).    amoxicillin-clavulanate (AUGMENTIN) 500-125 MG tablet Take 1 tablet by mouth daily.   atenolol (TENORMIN) 50 MG tablet Take 25 mg by mouth daily as needed (if bp is 190/90 or higher).    B Complex-C-Folic Acid (RENA-VITE RX) 1 MG TABS Take 1 tablet by mouth 3 (three) times a week.    bromocriptine (PARLODEL) 2.5 MG tablet TAKE 1 TABLET BY MOUTH AT BEDTIME.   calcium acetate (PHOSLO) 667 MG capsule Take 1,334-2,001 mg by mouth See admin instructions. Take 2001 mg by mouth with each meal & take 1334 mg by mouth with each snack.   cinacalcet (SENSIPAR) 30 MG tablet Take 30 mg by mouth daily.   CINNAMON PO Take 500 mg by mouth 2 (two) times daily.    doxycycline (VIBRAMYCIN) 100 MG capsule Take 100 mg by mouth 2 (two) times daily.   ibuprofen (ADVIL) 200 MG tablet Take 200 mg by mouth every 6 (six) hours as needed for headache or moderate pain.   Insulin Glargine (LANTUS SOLOSTAR) 100 UNIT/ML Solostar Pen Inject 15 Units into the skin at bedtime.    lidocaine-prilocaine (EMLA) cream Apply 1 application topically as needed (port access).   loratadine (CLARITIN) 10 MG tablet Take 10 mg by mouth daily as needed for allergies.   melatonin 5 MG TABS Take 5 mg by mouth at bedtime as needed (sleep).    Multiple Vitamin (MULTIVITAMIN WITH MINERALS) TABS tablet Take 1 tablet by mouth 3 (three) times a week. Men's Multivitamin   Multiple Vitamins-Minerals (EYE HEALTH PO) Take 1 capsule by mouth daily.    mupirocin ointment (BACTROBAN) 2 %  Apply topically daily.   Polyethyl Glycol-Propyl Glycol (SYSTANE OP) Place 1 drop into both eyes daily as needed (dry eyes).   sildenafil (VIAGRA) 50 MG tablet Take 1 tablet (50 mg total) by mouth daily as needed for erectile dysfunction.   warfarin (COUMADIN) 5 MG tablet Take 10 mg by mouth daily.    Current Facility-Administered Medications for the 01/28/22 encounter (Office Visit) with Delana Meyer, Dolores Lory, MD  Medication   0.9 %  sodium chloride infusion    Past Medical History:  Diagnosis Date   Anemia    Chronic kidney disease    14% FUNCTION   Diabetes mellitus without complication ([place])    Diabetic retinopathy ([place])    Dyspnea    DOE   Dysrhythmia    End stage renal disease ([place])    History of orthopnea    error wrong patietn   Hypercholesteremia    error   Hyperkalemia    error wrong patient   Hypertension    Long-term insulin use ([place])    Lymphedema of leg    Metabolic encephalopathy    error   Microalbuminuria    MSSA (methicillin susceptible Staphylococcus aureus)    Obesity    PVD (peripheral vascular disease) ([place])    Respiratory failure ([place])    Error   Sepsis ([place])  error   Sleep apnea    no CPAP   UTI (urinary tract infection)    error   Venous ulcer ([place])    Vitamin D deficiency     Past Surgical History:  Procedure Laterality Date   A/V FISTULAGRAM Right 10/24/2018   Procedure: A/V FISTULAGRAM;  Surgeon: Katha Cabal, MD;  Location: [facility];  Service: Cardiovascular;  Laterality: Right;   A/V FISTULAGRAM Right 11/24/2018   Procedure: A/V FISTULAGRAM;  Surgeon: Katha Cabal, MD;  Location: [facility];  Service: Cardiovascular;  Laterality: Right;   A/V FISTULAGRAM Left 07/31/2019   Procedure: A/V FISTULAGRAM;  Surgeon: Katha Cabal, MD;  Location: [facility];  Service: Cardiovascular;  Laterality: Left;   A/V FISTULAGRAM Left 12/11/2019   Procedure: A/V FISTULAGRAM;  Surgeon: Katha Cabal,  MD;  Location: [facility];  Service: Cardiovascular;  Laterality: Left;   A/V SHUNT INTERVENTION Right 02/21/2019   Procedure: A/V SHUNT INTERVENTION;  Surgeon: Katha Cabal, MD;  Location: [facility];  Service: Cardiovascular;  Laterality: Right;   APPLICATION OF WOUND VAC Left 10/03/2017   Procedure: APPLICATION OF WOUND VAC;  Surgeon: Algernon Huxley, MD;  Location: [facility];  Service: General;  Laterality: Left;   APPLICATION OF WOUND VAC Left 10/11/2017   Procedure: APPLICATION OF WOUND VAC;  Surgeon: Katha Cabal, MD;  Location: [facility];  Service: Vascular;  Laterality: Left;   APPLICATION OF WOUND VAC Left 10/14/2017   Procedure: WOUND VAC CHANGE;  Surgeon: Katha Cabal, MD;  Location: [facility];  Service: Vascular;  Laterality: Left;  left lower leg   APPLICATION OF WOUND VAC Left 10/18/2017   Procedure: WOUND VAC CHANGE;  Surgeon: Katha Cabal, MD;  Location: [facility];  Service: Vascular;  Laterality: Left;   APPLICATION OF WOUND VAC Left 10/07/2017   Procedure: APPLICATION OF WOUND VAC;  Surgeon: Katha Cabal, MD;  Location: [facility];  Service: Vascular;  Laterality: Left;   AV FISTULA INSERTION W/ RF MAGNETIC GUIDANCE Right 07/19/2018   Procedure: AV FISTULA INSERTION W/RF MAGNETIC GUIDANCE;  Surgeon: Katha Cabal, MD;  Location: [facility];  Service: Cardiovascular;  Laterality: Right;   AV FISTULA PLACEMENT Left 05/11/2019   Procedure: ARTERIOVENOUS (AV) FISTULA CREATION (RADIOCEPHALIC );  Surgeon: Katha Cabal, MD;  Location: [facility];  Service: Vascular;  Laterality: Left;   AV FISTULA PLACEMENT Left 02/20/2020   Procedure: INSERTION OF ARTERIOVENOUS (AV) GORE-TEX GRAFT LEFT ARM ( FOREARM LOOP );  Surgeon: Katha Cabal, MD;  Location: [facility];  Service: Vascular;  Laterality: Left;   CATARACT EXTRACTION W/PHACO Left 11/01/2018   Procedure: CATARACT EXTRACTION PHACO AND INTRAOCULAR LENS PLACEMENT (IOC)-LEFT,  DIABETIC-INSULIN DEPENDENT;  Surgeon: Eulogio Bear, MD;  Location: [facility];  Service: Ophthalmology;  Laterality: Left;  [place] 00:41.8 CDE 4.61 Fluid Pack Lot # [id number]   CATARACT EXTRACTION W/PHACO Right 04/27/2019   Procedure: CATARACT EXTRACTION PHACO AND INTRAOCULAR LENS PLACEMENT (IOC);  Surgeon: Eulogio Bear, MD;  Location: [facility];  Service: Ophthalmology;  Laterality: Right;  [place] 00:34 CDE 1.97 FLUID PACK LOT # [id number]    COLONOSCOPY WITH PROPOFOL N/A 05/12/2020   Procedure: COLONOSCOPY WITH PROPOFOL;  Surgeon: [place], Benay Pike, MD;  Location: [facility];  Service: Gastroenterology;  Laterality: N/A;   DIALYSIS/PERMA CATHETER INSERTION N/A 02/23/2019   Procedure: DIALYSIS/PERMA CATHETER INSERTION;  Surgeon: Algernon Huxley, MD;  Location: [facility];  Service: Cardiovascular;  Laterality: N/A;   DIALYSIS/PERMA CATHETER REMOVAL N/A 04/03/2020   Procedure: DIALYSIS/PERMA CATHETER REMOVAL;  Surgeon: Katha Cabal, MD;  Location: [facility];  Service: Cardiovascular;  Laterality: N/A;   I & D EXTREMITY Left 10/11/2017   Procedure: IRRIGATION AND DEBRIDEMENT EXTREMITY;  Surgeon: Katha Cabal, MD;  Location: [facility];  Service: Vascular;  Laterality: Left;   INCISION AND DRAINAGE ABSCESS Right 10/07/2017   Procedure: INCISION AND DRAINAGE ABSCESS;  Surgeon: Katha Cabal, MD;  Location: [facility];  Service: Vascular;  Laterality: Right;   IRRIGATION AND DEBRIDEMENT ABSCESS Left 10/03/2017   Procedure: IRRIGATION AND DEBRIDEMENT ABSCESS with debridement of skin, soft tissue, muscle 50sq cm;  Surgeon: Algernon Huxley, MD;  Location: [facility];  Service: General;  Laterality: Left;   TEMPORARY DIALYSIS CATHETER  02/20/2019   Procedure: TEMPORARY DIALYSIS CATHETER;  Surgeon: Katha Cabal, MD;  Location: [facility];  Service: Cardiovascular;;   UPPER EXTREMITY ANGIOGRAPHY Left 09/18/2019   Procedure: UPPER EXTREMITY ANGIOGRAPHY;  Surgeon: Katha Cabal, MD;  Location: [facility];  Service: Cardiovascular;  Laterality: Left;    Social History Social History   Tobacco Use   Smoking status: Former    Types: Cigars    Passive exposure: Past   Smokeless tobacco: Former    Quit date: 09/30/2017   Tobacco comments:    occasional  Vaping Use   Vaping Use: Never used  Substance Use Topics   Alcohol use: Yes    Alcohol/week: 1.0 standard drink    Types: 1 Cans of beer per week    Comment: beer   Drug use: Not Currently    Family History Family History  Problem Relation Age of Onset   Diabetes Father    Kidney disease Father    Diabetes Daughter     Allergies  Allergen Reactions   Lisinopril Swelling    Lips mouth    Furosemide Cough    Light headed, blurry vision, weakness.   Latex    Adhesive [Tape] Itching and Rash     REVIEW OF SYSTEMS (Negative unless checked)  Constitutional: '[]'$ Weight loss  '[]'$ Fever  '[]'$ Chills Cardiac: '[]'$ Chest pain   '[]'$ Chest pressure   '[]'$ Palpitations   '[]'$ Shortness of breath when laying flat   '[]'$ Shortness of breath with exertion. Vascular:  '[x]'$ Pain in legs with walking   '[]'$ Pain in legs at rest  '[]'$ History of DVT   '[]'$ Phlebitis   '[x]'$ Swelling in legs   '[]'$ Varicose veins   '[]'$ Non-healing ulcers Pulmonary:   '[]'$ Uses home oxygen   '[]'$ Productive cough   '[]'$ Hemoptysis   '[]'$ Wheeze  '[]'$ COPD   '[]'$ Asthma Neurologic:  '[]'$ Dizziness   '[]'$ Seizures   '[]'$ History of stroke   '[]'$ History of TIA  '[]'$ Aphasia   '[]'$ Vissual changes   '[]'$ Weakness or numbness in arm   '[]'$ Weakness or numbness in leg Musculoskeletal:   '[]'$ Joint swelling   '[]'$ Joint pain   '[]'$ Low back pain Hematologic:  '[]'$ Easy bruising  '[]'$ Easy bleeding   '[]'$ Hypercoagulable state   '[]'$ Anemic Gastrointestinal:  '[]'$ Diarrhea   '[]'$ Vomiting  '[]'$ Gastroesophageal reflux/heartburn   '[]'$ Difficulty swallowing. Genitourinary:  '[]'$ Chronic kidney disease   '[]'$ Difficult urination  '[]'$ Frequent urination   '[]'$ Blood in urine Skin:  '[]'$ Rashes   '[]'$ Ulcers  Psychological:  '[]'$ History of anxiety   '[]'$   History of major depression.  Physical Examination  Vitals:   01/28/22 1307  BP: (!) 176/81  Pulse: 84  Resp: 18  Weight: (!) 394 lb 9.6 oz (179  kg)  Height: '6\' 4"'$  (1.93 m)   Body mass index is 48.03 kg/m. Gen: WD/WN, NAD Head: [place]/AT, No temporalis wasting.  Ear/Nose/Throat: Hearing grossly intact, nares w/o erythema or drainage Eyes: PER, EOMI, sclera nonicteric.  Neck: Supple, no masses.  No bruit or JVD.  Pulmonary:  Good air movement, no audible wheezing, no use of accessory muscles.  Cardiac: RRR, normal S1, S2, no Murmurs. Vascular:   right should small open wound with a piece of cuff protruding; 3+ edema with severe venous changes Vessel Right Left  Radial Palpable Palpable  Gastrointestinal: soft, non-distended. No guarding/no peritoneal signs.  Musculoskeletal: M/S 5/5 throughout.  No visible deformity.  Neurologic: CN 2-12 intact. Pain and light touch intact in extremities.  Symmetrical.  Speech is fluent. Motor exam as listed above. Psychiatric: Judgment intact, Mood & affect appropriate for pt's clinical situation. Dermatologic: + venous rashes no venous ulcers noted.  No changes consistent with cellulitis.   CBC Lab Results  Component Value Date   WBC 6.0 02/19/2020   HGB 13.1 09/29/2020   HCT 41.8 09/29/2020   MCV 95.0 02/19/2020   PLT 190 02/19/2020    BMET    Component Value Date/Time   NA 140 02/20/2020 0650   K 5.1 05/12/2020 0842   CL 98 02/20/2020 0650   CO2 28 02/19/2020 1316   GLUCOSE 209 (H) 02/20/2020 0650   BUN 37 (H) 02/20/2020 0650   CREATININE 12.00 (H) 02/20/2020 0650   CALCIUM 8.5 (L) 02/19/2020 1316   GFRNONAA 8 (L) 02/19/2020 1316   GFRAA 9 (L) 02/19/2020 1316   CrCl cannot be calculated (Patient's most recent lab result is older than the maximum 21 days allowed.).  COAG Lab Results  Component Value Date   INR 1.4 (H) 03/21/2020   INR 1.2 02/20/2020   INR 1.4 (H) 02/19/2020    Radiology No results  found.   Assessment/Plan 1. Complication of vascular access for dialysis, sequela The patient will be scheduled to have the cuff removed similar to having a tunneled catheter removed.  Risk and benefits were reviewed the patient.  Indications for the procedure were reviewed.  All questions were answered, the patient agrees to proceed.    2. End stage renal disease ([place]) At the present time the patient has adequate dialysis access.  Continue hemodialysis as ordered without interruption.  Avoid nephrotoxic medications and dehydration.  Further plans per nephrology   3. Type 1 diabetes mellitus with other circulatory complication (HCC) Continue hypoglycemic medications as already ordered, these medications have been reviewed and there are no changes at this time.  Hgb A1C to be monitored as already arranged by primary service   4. Primary hypertension Continue antihypertensive medications as already ordered, these medications have been reviewed and there are no changes at this time.     [name], MD  01/28/2022 1:15 PM

## 2022-01-28 NOTE — H&P (View-Only) (Signed)
MRN : [id number]  [name] is a 51 y.o. (10-25-1970) male who presents with chief complaint of check circulation.  History of Present Illness:   Patient returns sooner than expected because he has a wound on his right shoulder with a piece of cuff sticking out  Current Meds  Medication Sig   allopurinol (ZYLOPRIM) 100 MG tablet Take 100 mg by mouth daily.   amLODipine (NORVASC) 10 MG tablet Take 5 mg by mouth daily as needed (if bp is 190/90 or higher).    amoxicillin-clavulanate (AUGMENTIN) 500-125 MG tablet Take 1 tablet by mouth daily.   atenolol (TENORMIN) 50 MG tablet Take 25 mg by mouth daily as needed (if bp is 190/90 or higher).    B Complex-C-Folic Acid (RENA-VITE RX) 1 MG TABS Take 1 tablet by mouth 3 (three) times a week.    bromocriptine (PARLODEL) 2.5 MG tablet TAKE 1 TABLET BY MOUTH AT BEDTIME.   calcium acetate (PHOSLO) 667 MG capsule Take 1,334-2,001 mg by mouth See admin instructions. Take 2001 mg by mouth with each meal & take 1334 mg by mouth with each snack.   cinacalcet (SENSIPAR) 30 MG tablet Take 30 mg by mouth daily.   CINNAMON PO Take 500 mg by mouth 2 (two) times daily.    doxycycline (VIBRAMYCIN) 100 MG capsule Take 100 mg by mouth 2 (two) times daily.   ibuprofen (ADVIL) 200 MG tablet Take 200 mg by mouth every 6 (six) hours as needed for headache or moderate pain.   Insulin Glargine (LANTUS SOLOSTAR) 100 UNIT/ML Solostar Pen Inject 15 Units into the skin at bedtime.    lidocaine-prilocaine (EMLA) cream Apply 1 application topically as needed (port access).   loratadine (CLARITIN) 10 MG tablet Take 10 mg by mouth daily as needed for allergies.   melatonin 5 MG TABS Take 5 mg by mouth at bedtime as needed (sleep).    Multiple Vitamin (MULTIVITAMIN WITH MINERALS) TABS tablet Take 1 tablet by mouth 3 (three) times a week. Men's Multivitamin   Multiple Vitamins-Minerals (EYE HEALTH PO) Take 1 capsule by mouth daily.    mupirocin ointment (BACTROBAN) 2 %  Apply topically daily.   Polyethyl Glycol-Propyl Glycol (SYSTANE OP) Place 1 drop into both eyes daily as needed (dry eyes).   sildenafil (VIAGRA) 50 MG tablet Take 1 tablet (50 mg total) by mouth daily as needed for erectile dysfunction.   warfarin (COUMADIN) 5 MG tablet Take 10 mg by mouth daily.    Current Facility-Administered Medications for the 01/28/22 encounter (Office Visit) with Delana Meyer, Dolores Lory, MD  Medication   0.9 %  sodium chloride infusion    Past Medical History:  Diagnosis Date   Anemia    Chronic kidney disease    14% FUNCTION   Diabetes mellitus without complication ([place])    Diabetic retinopathy ([place])    Dyspnea    DOE   Dysrhythmia    End stage renal disease ([place])    History of orthopnea    error wrong patietn   Hypercholesteremia    error   Hyperkalemia    error wrong patient   Hypertension    Long-term insulin use ([place])    Lymphedema of leg    Metabolic encephalopathy    error   Microalbuminuria    MSSA (methicillin susceptible Staphylococcus aureus)    Obesity    PVD (peripheral vascular disease) ([place])    Respiratory failure ([place])    Error   Sepsis ([place])  error   Sleep apnea    no CPAP   UTI (urinary tract infection)    error   Venous ulcer ([place])    Vitamin D deficiency     Past Surgical History:  Procedure Laterality Date   A/V FISTULAGRAM Right 10/24/2018   Procedure: A/V FISTULAGRAM;  Surgeon: Katha Cabal, MD;  Location: [facility];  Service: Cardiovascular;  Laterality: Right;   A/V FISTULAGRAM Right 11/24/2018   Procedure: A/V FISTULAGRAM;  Surgeon: Katha Cabal, MD;  Location: [facility];  Service: Cardiovascular;  Laterality: Right;   A/V FISTULAGRAM Left 07/31/2019   Procedure: A/V FISTULAGRAM;  Surgeon: Katha Cabal, MD;  Location: [facility];  Service: Cardiovascular;  Laterality: Left;   A/V FISTULAGRAM Left 12/11/2019   Procedure: A/V FISTULAGRAM;  Surgeon: Katha Cabal,  MD;  Location: [facility];  Service: Cardiovascular;  Laterality: Left;   A/V SHUNT INTERVENTION Right 02/21/2019   Procedure: A/V SHUNT INTERVENTION;  Surgeon: Katha Cabal, MD;  Location: [facility];  Service: Cardiovascular;  Laterality: Right;   APPLICATION OF WOUND VAC Left 10/03/2017   Procedure: APPLICATION OF WOUND VAC;  Surgeon: Algernon Huxley, MD;  Location: [facility];  Service: General;  Laterality: Left;   APPLICATION OF WOUND VAC Left 10/11/2017   Procedure: APPLICATION OF WOUND VAC;  Surgeon: Katha Cabal, MD;  Location: [facility];  Service: Vascular;  Laterality: Left;   APPLICATION OF WOUND VAC Left 10/14/2017   Procedure: WOUND VAC CHANGE;  Surgeon: Katha Cabal, MD;  Location: [facility];  Service: Vascular;  Laterality: Left;  left lower leg   APPLICATION OF WOUND VAC Left 10/18/2017   Procedure: WOUND VAC CHANGE;  Surgeon: Katha Cabal, MD;  Location: [facility];  Service: Vascular;  Laterality: Left;   APPLICATION OF WOUND VAC Left 10/07/2017   Procedure: APPLICATION OF WOUND VAC;  Surgeon: Katha Cabal, MD;  Location: [facility];  Service: Vascular;  Laterality: Left;   AV FISTULA INSERTION W/ RF MAGNETIC GUIDANCE Right 07/19/2018   Procedure: AV FISTULA INSERTION W/RF MAGNETIC GUIDANCE;  Surgeon: Katha Cabal, MD;  Location: [facility];  Service: Cardiovascular;  Laterality: Right;   AV FISTULA PLACEMENT Left 05/11/2019   Procedure: ARTERIOVENOUS (AV) FISTULA CREATION (RADIOCEPHALIC );  Surgeon: Katha Cabal, MD;  Location: [facility];  Service: Vascular;  Laterality: Left;   AV FISTULA PLACEMENT Left 02/20/2020   Procedure: INSERTION OF ARTERIOVENOUS (AV) GORE-TEX GRAFT LEFT ARM ( FOREARM LOOP );  Surgeon: Katha Cabal, MD;  Location: [facility];  Service: Vascular;  Laterality: Left;   CATARACT EXTRACTION W/PHACO Left 11/01/2018   Procedure: CATARACT EXTRACTION PHACO AND INTRAOCULAR LENS PLACEMENT (IOC)-LEFT,  DIABETIC-INSULIN DEPENDENT;  Surgeon: Eulogio Bear, MD;  Location: [facility];  Service: Ophthalmology;  Laterality: Left;  [place] 00:41.8 CDE 4.61 Fluid Pack Lot # [id number]   CATARACT EXTRACTION W/PHACO Right 04/27/2019   Procedure: CATARACT EXTRACTION PHACO AND INTRAOCULAR LENS PLACEMENT (IOC);  Surgeon: Eulogio Bear, MD;  Location: [facility];  Service: Ophthalmology;  Laterality: Right;  [place] 00:34 CDE 1.97 FLUID PACK LOT # [id number]    COLONOSCOPY WITH PROPOFOL N/A 05/12/2020   Procedure: COLONOSCOPY WITH PROPOFOL;  Surgeon: [place], Benay Pike, MD;  Location: [facility];  Service: Gastroenterology;  Laterality: N/A;   DIALYSIS/PERMA CATHETER INSERTION N/A 02/23/2019   Procedure: DIALYSIS/PERMA CATHETER INSERTION;  Surgeon: Algernon Huxley, MD;  Location: [facility];  Service: Cardiovascular;  Laterality: N/A;   DIALYSIS/PERMA CATHETER REMOVAL N/A 04/03/2020   Procedure: DIALYSIS/PERMA CATHETER REMOVAL;  Surgeon: Katha Cabal, MD;  Location: [facility];  Service: Cardiovascular;  Laterality: N/A;   I & D EXTREMITY Left 10/11/2017   Procedure: IRRIGATION AND DEBRIDEMENT EXTREMITY;  Surgeon: Katha Cabal, MD;  Location: [facility];  Service: Vascular;  Laterality: Left;   INCISION AND DRAINAGE ABSCESS Right 10/07/2017   Procedure: INCISION AND DRAINAGE ABSCESS;  Surgeon: Katha Cabal, MD;  Location: [facility];  Service: Vascular;  Laterality: Right;   IRRIGATION AND DEBRIDEMENT ABSCESS Left 10/03/2017   Procedure: IRRIGATION AND DEBRIDEMENT ABSCESS with debridement of skin, soft tissue, muscle 50sq cm;  Surgeon: Algernon Huxley, MD;  Location: [facility];  Service: General;  Laterality: Left;   TEMPORARY DIALYSIS CATHETER  02/20/2019   Procedure: TEMPORARY DIALYSIS CATHETER;  Surgeon: Katha Cabal, MD;  Location: [facility];  Service: Cardiovascular;;   UPPER EXTREMITY ANGIOGRAPHY Left 09/18/2019   Procedure: UPPER EXTREMITY ANGIOGRAPHY;  Surgeon: Katha Cabal, MD;  Location: [facility];  Service: Cardiovascular;  Laterality: Left;    Social History Social History   Tobacco Use   Smoking status: Former    Types: Cigars    Passive exposure: Past   Smokeless tobacco: Former    Quit date: 09/30/2017   Tobacco comments:    occasional  Vaping Use   Vaping Use: Never used  Substance Use Topics   Alcohol use: Yes    Alcohol/week: 1.0 standard drink    Types: 1 Cans of beer per week    Comment: beer   Drug use: Not Currently    Family History Family History  Problem Relation Age of Onset   Diabetes Father    Kidney disease Father    Diabetes Daughter     Allergies  Allergen Reactions   Lisinopril Swelling    Lips mouth    Furosemide Cough    Light headed, blurry vision, weakness.   Latex    Adhesive [Tape] Itching and Rash     REVIEW OF SYSTEMS (Negative unless checked)  Constitutional: '[]'$ Weight loss  '[]'$ Fever  '[]'$ Chills Cardiac: '[]'$ Chest pain   '[]'$ Chest pressure   '[]'$ Palpitations   '[]'$ Shortness of breath when laying flat   '[]'$ Shortness of breath with exertion. Vascular:  '[x]'$ Pain in legs with walking   '[]'$ Pain in legs at rest  '[]'$ History of DVT   '[]'$ Phlebitis   '[x]'$ Swelling in legs   '[]'$ Varicose veins   '[]'$ Non-healing ulcers Pulmonary:   '[]'$ Uses home oxygen   '[]'$ Productive cough   '[]'$ Hemoptysis   '[]'$ Wheeze  '[]'$ COPD   '[]'$ Asthma Neurologic:  '[]'$ Dizziness   '[]'$ Seizures   '[]'$ History of stroke   '[]'$ History of TIA  '[]'$ Aphasia   '[]'$ Vissual changes   '[]'$ Weakness or numbness in arm   '[]'$ Weakness or numbness in leg Musculoskeletal:   '[]'$ Joint swelling   '[]'$ Joint pain   '[]'$ Low back pain Hematologic:  '[]'$ Easy bruising  '[]'$ Easy bleeding   '[]'$ Hypercoagulable state   '[]'$ Anemic Gastrointestinal:  '[]'$ Diarrhea   '[]'$ Vomiting  '[]'$ Gastroesophageal reflux/heartburn   '[]'$ Difficulty swallowing. Genitourinary:  '[]'$ Chronic kidney disease   '[]'$ Difficult urination  '[]'$ Frequent urination   '[]'$ Blood in urine Skin:  '[]'$ Rashes   '[]'$ Ulcers  Psychological:  '[]'$ History of anxiety   '[]'$   History of major depression.  Physical Examination  Vitals:   01/28/22 1307  BP: (!) 176/81  Pulse: 84  Resp: 18  Weight: (!) 394 lb 9.6 oz (179  kg)  Height: '6\' 4"'$  (1.93 m)   Body mass index is 48.03 kg/m. Gen: WD/WN, NAD Head: [place]/AT, No temporalis wasting.  Ear/Nose/Throat: Hearing grossly intact, nares w/o erythema or drainage Eyes: PER, EOMI, sclera nonicteric.  Neck: Supple, no masses.  No bruit or JVD.  Pulmonary:  Good air movement, no audible wheezing, no use of accessory muscles.  Cardiac: RRR, normal S1, S2, no Murmurs. Vascular:   right should small open wound with a piece of cuff protruding; 3+ edema with severe venous changes Vessel Right Left  Radial Palpable Palpable  Gastrointestinal: soft, non-distended. No guarding/no peritoneal signs.  Musculoskeletal: M/S 5/5 throughout.  No visible deformity.  Neurologic: CN 2-12 intact. Pain and light touch intact in extremities.  Symmetrical.  Speech is fluent. Motor exam as listed above. Psychiatric: Judgment intact, Mood & affect appropriate for pt's clinical situation. Dermatologic: + venous rashes no venous ulcers noted.  No changes consistent with cellulitis.   CBC Lab Results  Component Value Date   WBC 6.0 02/19/2020   HGB 13.1 09/29/2020   HCT 41.8 09/29/2020   MCV 95.0 02/19/2020   PLT 190 02/19/2020    BMET    Component Value Date/Time   NA 140 02/20/2020 0650   K 5.1 05/12/2020 0842   CL 98 02/20/2020 0650   CO2 28 02/19/2020 1316   GLUCOSE 209 (H) 02/20/2020 0650   BUN 37 (H) 02/20/2020 0650   CREATININE 12.00 (H) 02/20/2020 0650   CALCIUM 8.5 (L) 02/19/2020 1316   GFRNONAA 8 (L) 02/19/2020 1316   GFRAA 9 (L) 02/19/2020 1316   CrCl cannot be calculated (Patient's most recent lab result is older than the maximum 21 days allowed.).  COAG Lab Results  Component Value Date   INR 1.4 (H) 03/21/2020   INR 1.2 02/20/2020   INR 1.4 (H) 02/19/2020    Radiology No results  found.   Assessment/Plan 1. Complication of vascular access for dialysis, sequela The patient will be scheduled to have the cuff removed similar to having a tunneled catheter removed.  Risk and benefits were reviewed the patient.  Indications for the procedure were reviewed.  All questions were answered, the patient agrees to proceed.    2. End stage renal disease ([place]) At the present time the patient has adequate dialysis access.  Continue hemodialysis as ordered without interruption.  Avoid nephrotoxic medications and dehydration.  Further plans per nephrology   3. Type 1 diabetes mellitus with other circulatory complication (HCC) Continue hypoglycemic medications as already ordered, these medications have been reviewed and there are no changes at this time.  Hgb A1C to be monitored as already arranged by primary service   4. Primary hypertension Continue antihypertensive medications as already ordered, these medications have been reviewed and there are no changes at this time.     [name], MD  01/28/2022 1:15 PM

## 2022-01-29 ENCOUNTER — Telehealth (INDEPENDENT_AMBULATORY_CARE_PROVIDER_SITE_OTHER): Payer: Self-pay

## 2022-01-29 NOTE — Telephone Encounter (Signed)
Spoke with the patient and he is scheduled with Dr. Delana Meyer for a permcath removal on 02/03/22 with a 10:00 am arrival time to the MM. Pre-procedure instructions were discussed and will be mailed.

## 2022-01-30 DIAGNOSIS — Z992 Dependence on renal dialysis: Secondary | ICD-10-CM | POA: Diagnosis not present

## 2022-01-30 DIAGNOSIS — N186 End stage renal disease: Secondary | ICD-10-CM | POA: Diagnosis not present

## 2022-01-30 DIAGNOSIS — N2581 Secondary hyperparathyroidism of renal origin: Secondary | ICD-10-CM | POA: Diagnosis not present

## 2022-01-31 ENCOUNTER — Encounter (INDEPENDENT_AMBULATORY_CARE_PROVIDER_SITE_OTHER): Payer: Self-pay | Admitting: Vascular Surgery

## 2022-02-01 DIAGNOSIS — Z794 Long term (current) use of insulin: Secondary | ICD-10-CM | POA: Diagnosis not present

## 2022-02-01 DIAGNOSIS — E1165 Type 2 diabetes mellitus with hyperglycemia: Secondary | ICD-10-CM | POA: Diagnosis not present

## 2022-02-02 DIAGNOSIS — Z992 Dependence on renal dialysis: Secondary | ICD-10-CM | POA: Diagnosis not present

## 2022-02-02 DIAGNOSIS — N186 End stage renal disease: Secondary | ICD-10-CM | POA: Diagnosis not present

## 2022-02-02 DIAGNOSIS — Z5181 Encounter for therapeutic drug level monitoring: Secondary | ICD-10-CM | POA: Diagnosis not present

## 2022-02-02 DIAGNOSIS — Z7901 Long term (current) use of anticoagulants: Secondary | ICD-10-CM | POA: Diagnosis not present

## 2022-02-02 DIAGNOSIS — N2581 Secondary hyperparathyroidism of renal origin: Secondary | ICD-10-CM | POA: Diagnosis not present

## 2022-02-03 ENCOUNTER — Ambulatory Visit
Admission: RE | Admit: 2022-02-03 | Discharge: 2022-02-03 | Disposition: A | Discharge status: Home / Self Care | Payer: Medicare HMO | Attending: Vascular Surgery | Admitting: Vascular Surgery

## 2022-02-03 ENCOUNTER — Ambulatory Visit: Payer: Medicare HMO | Admitting: Physician Assistant

## 2022-02-03 ENCOUNTER — Encounter
Admission: RE | Disposition: A | Discharge status: Home / Self Care | Payer: Self-pay | Source: Home / Self Care | Attending: Vascular Surgery

## 2022-02-03 ENCOUNTER — Encounter: Payer: Self-pay | Admitting: Vascular Surgery

## 2022-02-03 ENCOUNTER — Ambulatory Visit: Payer: Medicare HMO | Admitting: Endocrinology

## 2022-02-03 DIAGNOSIS — I120 Hypertensive chronic kidney disease with stage 5 chronic kidney disease or end stage renal disease: Secondary | ICD-10-CM | POA: Insufficient documentation

## 2022-02-03 DIAGNOSIS — Z452 Encounter for adjustment and management of vascular access device: Secondary | ICD-10-CM | POA: Diagnosis not present

## 2022-02-03 DIAGNOSIS — Z794 Long term (current) use of insulin: Secondary | ICD-10-CM | POA: Insufficient documentation

## 2022-02-03 DIAGNOSIS — Z992 Dependence on renal dialysis: Secondary | ICD-10-CM | POA: Insufficient documentation

## 2022-02-03 DIAGNOSIS — N186 End stage renal disease: Secondary | ICD-10-CM | POA: Diagnosis not present

## 2022-02-03 DIAGNOSIS — Z87891 Personal history of nicotine dependence: Secondary | ICD-10-CM | POA: Insufficient documentation

## 2022-02-03 DIAGNOSIS — Y848 Other medical procedures as the cause of abnormal reaction of the patient, or of later complication, without mention of misadventure at the time of the procedure: Secondary | ICD-10-CM | POA: Insufficient documentation

## 2022-02-03 DIAGNOSIS — E1022 Type 1 diabetes mellitus with diabetic chronic kidney disease: Secondary | ICD-10-CM | POA: Diagnosis not present

## 2022-02-03 DIAGNOSIS — T82898S Other specified complication of vascular prosthetic devices, implants and grafts, sequela: Secondary | ICD-10-CM | POA: Diagnosis not present

## 2022-02-03 HISTORY — PX: DIALYSIS/PERMA CATHETER REMOVAL: CATH118289

## 2022-02-03 SURGERY — DIALYSIS/PERMA CATHETER REMOVAL
Anesthesia: LOCAL

## 2022-02-03 SURGICAL SUPPLY — 10 items
CHLORAPREP W/TINT 26 (MISCELLANEOUS) ×1 IMPLANT
DERMABOND ADVANCED (GAUZE/BANDAGES/DRESSINGS) ×1
DERMABOND ADVANCED .7 DNX12 (GAUZE/BANDAGES/DRESSINGS) IMPLANT
FCP FG STRG 5.5XNS LF DISP (INSTRUMENTS) ×1
FORCEPS FG STRG 5.5XNS LF DISP (INSTRUMENTS) IMPLANT
FORCEPS HALSTEAD CVD 5IN STRL (INSTRUMENTS) ×1 IMPLANT
FORCEPS KELLY 5.5 STR (INSTRUMENTS) ×1
SCALPEL PROTECTED #11 DISP (BLADE) ×1 IMPLANT
SUT MNCRL AB 4-0 PS2 18 (SUTURE) ×2 IMPLANT
TRAY LACERAT/PLASTIC (MISCELLANEOUS) ×1 IMPLANT

## 2022-02-03 NOTE — Interval H&P Note (Signed)
History and Physical Interval Note:  02/03/2022 10:42 AM  [name]  has presented today for surgery, with the diagnosis of Perma Cath Removal   End Stage Renal.  The various methods of treatment have been discussed with the patient and family. After consideration of risks, benefits and other options for treatment, the patient has consented to  Procedure(s): DIALYSIS/PERMA CATHETER REMOVAL (N/A) as a surgical intervention.  The patient's history has been reviewed, patient examined, no change in status, stable for surgery.  I have reviewed the patient's chart and labs.  Questions were answered to the patient's satisfaction.     [name]

## 2022-02-03 NOTE — Discharge Instructions (Signed)
Tunneled Catheter Removal, Care After Refer to this sheet in the next few weeks. These instructions provide you with information about caring for yourself after your procedure. Your health care provider may also give you more specific instructions. Your treatment has been planned according to current medical practices, but problems sometimes occur. Call your health care provider if you have any problems or questions after your procedure. What can I expect after the procedure? After the procedure, it is common to have: Some mild redness, swelling, and pain around your catheter site.   Follow these instructions at home: Incision care  Check your removal site  every day for signs of infection. Check for: More redness, swelling, or pain. More fluid or blood. Warmth. Pus or a bad smell. Remove your dressing in 48hrs leave open to air  Activity  Return to your normal activities as told by your health care provider. Ask your health care provider what activities are safe for you. Do not lift anything that is heavier than 10 lb (4.5 kg) for 3 days  You may shower tomorrow  Contact a health care provider if: You have more fluid or blood coming from your removal site You have more redness, swelling, or pain at your incisions or around the area where your catheter was removed Your removal site feel warm to the touch. You feel unusually weak. You feel nauseous.. Get help right away if You have swelling in your arm, shoulder, neck, or face. You develop chest pain. You have difficulty breathing. You feel dizzy or light-headed. You have pus or a bad smell coming from your removal site You have a fever. You develop bleeding from your removal site, and your bleeding does not stop. This information is not intended to replace advice given to you by your health care provider. Make sure you discuss any questions you have with your health care provider. Document Released: 08/02/2012 Document Revised:  04/18/2016 Document Reviewed: 05/12/2015 Elsevier Interactive Patient Education  2017 [employer]vier Inc[employer] 

## 2022-02-03 NOTE — Op Note (Signed)
    OPERATIVE NOTE   PROCEDURE: Excision of foreign body right chest wall (retained cuff material from a tunneled catheter)  PRE-OPERATIVE DIAGNOSIS: Retained cuff material from a previous tunneled catheter right chest wall  POST-OPERATIVE DIAGNOSIS: Same  SURGEON: [name]  ASSISTANT(S): None  ANESTHESIA: local  ESTIMATED BLOOD LOSS: Less than 5 cc  FINDING(S): Cuff embedded in the subcutaneous tissues of the right chest wall  SPECIMEN(S): Cuff and debrided subcutaneous tissues with skin no specimen sent to pathology  INDICATIONS:   [name] is a 51 y.o. male who presents with a segment of a cuff from a previous tunneled catheter protruding from the skin of his right chest wall.  DESCRIPTION: After full informed written consent was obtained from the patient, the patient was brought special procedures and placed supine upon the gurney.   After obtaining adequate anesthesia, the patient was then prepped and draped in the standard fashion for a excision of this retained cuff.  The area surrounding the cuff was then infiltrated with 1% lidocaine with epinephrine.  Using an 11 blade scalpel an elliptical incision was created around the cuff and the dissection was carried down into the subcutaneous tissues.  The cuff extended into the fatty subcutaneous tissues but did not appear to be deeper than fascia.  All of the cuff material as well as the surrounding subcutaneous fatty tissues scar tissue and skin was then debrided using the 11 blade scalpel and scissors.  Hemostasis was achieved with direct pressure.  The wound was then closed in 2 layers using interrupted 4-0 Monocryl suture.  The superficial layer was subcuticular and Dermabond was placed.   The patient tolerated this procedure well.   COMPLICATIONS: None  CONDITION: [name]  Office: [phone]   02/03/2022, 10:44 AM

## 2022-02-04 DIAGNOSIS — N2581 Secondary hyperparathyroidism of renal origin: Secondary | ICD-10-CM | POA: Diagnosis not present

## 2022-02-04 DIAGNOSIS — Z992 Dependence on renal dialysis: Secondary | ICD-10-CM | POA: Diagnosis not present

## 2022-02-04 DIAGNOSIS — N186 End stage renal disease: Secondary | ICD-10-CM | POA: Diagnosis not present

## 2022-02-08 DIAGNOSIS — T82858A Stenosis of vascular prosthetic devices, implants and grafts, initial encounter: Secondary | ICD-10-CM | POA: Diagnosis not present

## 2022-02-08 DIAGNOSIS — Z992 Dependence on renal dialysis: Secondary | ICD-10-CM | POA: Diagnosis not present

## 2022-02-08 DIAGNOSIS — G4730 Sleep apnea, unspecified: Secondary | ICD-10-CM | POA: Diagnosis not present

## 2022-02-08 DIAGNOSIS — N186 End stage renal disease: Secondary | ICD-10-CM | POA: Diagnosis not present

## 2022-02-08 DIAGNOSIS — E1122 Type 2 diabetes mellitus with diabetic chronic kidney disease: Secondary | ICD-10-CM | POA: Diagnosis not present

## 2022-02-08 DIAGNOSIS — T82868A Thrombosis of vascular prosthetic devices, implants and grafts, initial encounter: Secondary | ICD-10-CM | POA: Diagnosis not present

## 2022-02-08 DIAGNOSIS — I120 Hypertensive chronic kidney disease with stage 5 chronic kidney disease or end stage renal disease: Secondary | ICD-10-CM | POA: Diagnosis not present

## 2022-02-08 DIAGNOSIS — M1030 Gout due to renal impairment, unspecified site: Secondary | ICD-10-CM | POA: Diagnosis not present

## 2022-02-09 DIAGNOSIS — N186 End stage renal disease: Secondary | ICD-10-CM | POA: Diagnosis not present

## 2022-02-09 DIAGNOSIS — Z992 Dependence on renal dialysis: Secondary | ICD-10-CM | POA: Diagnosis not present

## 2022-02-09 DIAGNOSIS — N2581 Secondary hyperparathyroidism of renal origin: Secondary | ICD-10-CM | POA: Diagnosis not present

## 2022-02-10 ENCOUNTER — Encounter: Payer: Medicare HMO | Attending: Internal Medicine | Admitting: Internal Medicine

## 2022-02-10 ENCOUNTER — Telehealth: Payer: Self-pay | Admitting: Urology

## 2022-02-10 ENCOUNTER — Other Ambulatory Visit: Payer: Self-pay | Admitting: Urology

## 2022-02-10 DIAGNOSIS — E10622 Type 1 diabetes mellitus with other skin ulcer: Secondary | ICD-10-CM | POA: Diagnosis not present

## 2022-02-10 DIAGNOSIS — Z79899 Other long term (current) drug therapy: Secondary | ICD-10-CM | POA: Diagnosis not present

## 2022-02-10 DIAGNOSIS — X58XXXA Exposure to other specified factors, initial encounter: Secondary | ICD-10-CM | POA: Diagnosis not present

## 2022-02-10 DIAGNOSIS — S1180XA Unspecified open wound of other specified part of neck, initial encounter: Secondary | ICD-10-CM | POA: Insufficient documentation

## 2022-02-10 DIAGNOSIS — N529 Male erectile dysfunction, unspecified: Secondary | ICD-10-CM

## 2022-02-10 DIAGNOSIS — E1051 Type 1 diabetes mellitus with diabetic peripheral angiopathy without gangrene: Secondary | ICD-10-CM | POA: Insufficient documentation

## 2022-02-10 DIAGNOSIS — E1039 Type 1 diabetes mellitus with other diabetic ophthalmic complication: Secondary | ICD-10-CM | POA: Diagnosis not present

## 2022-02-10 DIAGNOSIS — E1022 Type 1 diabetes mellitus with diabetic chronic kidney disease: Secondary | ICD-10-CM | POA: Insufficient documentation

## 2022-02-10 DIAGNOSIS — N2581 Secondary hyperparathyroidism of renal origin: Secondary | ICD-10-CM | POA: Diagnosis not present

## 2022-02-10 DIAGNOSIS — E1040 Type 1 diabetes mellitus with diabetic neuropathy, unspecified: Secondary | ICD-10-CM | POA: Insufficient documentation

## 2022-02-10 DIAGNOSIS — N186 End stage renal disease: Secondary | ICD-10-CM | POA: Insufficient documentation

## 2022-02-10 DIAGNOSIS — I890 Lymphedema, not elsewhere classified: Secondary | ICD-10-CM | POA: Insufficient documentation

## 2022-02-10 DIAGNOSIS — Z992 Dependence on renal dialysis: Secondary | ICD-10-CM | POA: Diagnosis not present

## 2022-02-10 NOTE — Telephone Encounter (Signed)
A refill request was sent over for the Viagra and I was wondering if [name] is requesting the refill or is it an automatic refill request.  If he did request the refill, is the Viagra giving him satisfactory erections for intercourse?

## 2022-02-10 NOTE — Telephone Encounter (Signed)
Attempted to reach pt, LMOM for pt to please return call.

## 2022-02-10 NOTE — Progress Notes (Signed)
[name] ([id number]) Visit Report for 02/10/2022 Arrival Information Details Patient Name: [name], [name] Date of Service: 02/10/2022 8:30 AM Medical Record Number: [id number] Patient Account Number: [id number] Date of Birth/Sex: 1971-04-03 (51 y.o. M) Treating RN: [name] Primary Care Arsal Tappan: Thereasa Distance Other Clinician: Referring Julaine Zimny: Thereasa Distance Treating Leeland Lovelady/Extender: Yaakov Guthrie in Treatment: 3 Visit Information History Since Last Visit Added or deleted any medications: No Patient Arrived: Ambulatory Any new allergies or adverse reactions: No Arrival Time: 08:33 Had a fall or experienced change in No Accompanied By: self activities of daily living that may affect Transfer Assistance: None risk of falls: Patient Identification Verified: Yes Hospitalized since last visit: No Secondary Verification Process Completed: Yes Has Dressing in Place as Prescribed: No Patient Requires Transmission-Based No Pain Present Now: No Precautions: Patient Has Alerts: Yes Patient Alerts: Patient on Blood Thinner DIABETIC Electronic Signature(s) Signed: 02/10/2022 4:18:56 PM By: [name] Entered By: [name] on 02/10/2022 08:36:52 [name], [name] ([id number]) -------------------------------------------------------------------------------- Clinic Level of Care Assessment Details Patient Name: [name], [name] Date of Service: 02/10/2022 8:30 AM Medical Record Number: [id number] Patient Account Number: [id number] Date of Birth/Sex: 29-Aug-1971 (51 y.o. M) Treating RN: [name] Primary Care Infinity Jeffords: Thereasa Distance Other Clinician: Referring Elmus Mathes: Thereasa Distance Treating Haelyn Forgey/Extender: Yaakov Guthrie in Treatment: 3 Clinic Level of Care Assessment Items TOOL 4 Quantity Score '[]'$  - Use when only an EandM is performed on FOLLOW-UP visit 0 ASSESSMENTS - Nursing Assessment / Reassessment X - Reassessment of  Co-morbidities (includes updates in patient status) 1 10 X- 1 5 Reassessment of Adherence to Treatment Plan ASSESSMENTS - Wound and Skin Assessment / Reassessment X - Simple Wound Assessment / Reassessment - one wound 1 5 '[]'$  - 0 Complex Wound Assessment / Reassessment - multiple wounds '[]'$  - 0 Dermatologic / Skin Assessment (not related to wound area) ASSESSMENTS - Focused Assessment '[]'$  - Circumferential Edema Measurements - multi extremities 0 '[]'$  - 0 Nutritional Assessment / Counseling / Intervention '[]'$  - 0 Lower Extremity Assessment (monofilament, tuning fork, pulses) '[]'$  - 0 Peripheral Arterial Disease Assessment (using hand held doppler) ASSESSMENTS - Ostomy and/or Continence Assessment and Care '[]'$  - Incontinence Assessment and Management 0 '[]'$  - 0 Ostomy Care Assessment and Management (repouching, etc.) PROCESS - Coordination of Care X - Simple Patient / Family Education for ongoing care 1 15 '[]'$  - 0 Complex (extensive) Patient / Family Education for ongoing care '[]'$  - 0 Staff obtains Programmer, systems, Records, Test Results / Process Orders '[]'$  - 0 Staff telephones HHA, Nursing Homes / Clarify orders / etc '[]'$  - 0 Routine Transfer to another Facility (non-emergent condition) '[]'$  - 0 Routine Hospital Admission (non-emergent condition) '[]'$  - 0 New Admissions / Biomedical engineer / Ordering NPWT, Apligraf, etc. '[]'$  - 0 Emergency Hospital Admission (emergent condition) X- 1 10 Simple Discharge Coordination '[]'$  - 0 Complex (extensive) Discharge Coordination PROCESS - Special Needs '[]'$  - Pediatric / Minor Patient Management 0 '[]'$  - 0 Isolation Patient Management '[]'$  - 0 Hearing / Language / Visual special needs '[]'$  - 0 Assessment of Community assistance (transportation, D/C planning, etc.) '[]'$  - 0 Additional assistance / Altered mentation '[]'$  - 0 Support Surface(s) Assessment (bed, cushion, seat, etc.) INTERVENTIONS - Wound Cleansing / Measurement [name], [name] ([id number]) X- 1  5 Simple Wound Cleansing - one wound '[]'$  - 0 Complex Wound Cleansing - multiple wounds X- 1 5 Wound Imaging (photographs - any number of wounds) '[]'$  - 0 Wound Tracing (instead of photographs) X-  1 5 Simple Wound Measurement - one wound '[]'$  - 0 Complex Wound Measurement - multiple wounds INTERVENTIONS - Wound Dressings '[]'$  - Small Wound Dressing one or multiple wounds 0 '[]'$  - 0 Medium Wound Dressing one or multiple wounds '[]'$  - 0 Large Wound Dressing one or multiple wounds '[]'$  - 0 Application of Medications - topical '[]'$  - 0 Application of Medications - injection INTERVENTIONS - Miscellaneous '[]'$  - External ear exam 0 '[]'$  - 0 Specimen Collection (cultures, biopsies, blood, body fluids, etc.) '[]'$  - 0 Specimen(s) / Culture(s) sent or taken to Lab for analysis '[]'$  - 0 Patient Transfer (multiple staff / Civil Service fast streamer / Similar devices) '[]'$  - 0 Simple Staple / Suture removal (25 or less) '[]'$  - 0 Complex Staple / Suture removal (26 or more) '[]'$  - 0 Hypo / Hyperglycemic Management (close monitor of Blood Glucose) '[]'$  - 0 Ankle / Brachial Index (ABI) - do not check if billed separately X- 1 5 Vital Signs Has the patient been seen at the hospital within the last three years: Yes Total Score: 65 Level Of Care: New/Established - Level 2 Electronic Signature(s) Signed: 02/10/2022 4:18:56 PM By: [name] Entered By: [name] on 02/10/2022 08:54:25 [name], [name] ([id number]) -------------------------------------------------------------------------------- Encounter Discharge Information Details Patient Name: [name], [name] Date of Service: 02/10/2022 8:30 AM Medical Record Number: [id number] Patient Account Number: [id number] Date of Birth/Sex: 1971/08/11 (50 y.o. M) Treating RN: [name] Primary Care Taryn Nave: Thereasa Distance Other Clinician: Referring Margene Cherian: Thereasa Distance Treating Damarion Mendizabal/Extender: Yaakov Guthrie in Treatment: 3 Encounter Discharge  Information Items Discharge Condition: Stable Ambulatory Status: Ambulatory Discharge Destination: Home Transportation: Private Auto Accompanied By: self Schedule Follow-up Appointment: No Clinical Summary of Care: Electronic Signature(s) Signed: 02/10/2022 4:18:56 PM By: [name] Entered By: [name] on 02/10/2022 08:55:19 Smelcer, Murphy A. ([id number]) -------------------------------------------------------------------------------- Lower Extremity Assessment Details Patient Name: [name], [name] Date of Service: 02/10/2022 8:30 AM Medical Record Number: [id number] Patient Account Number: [id number] Date of Birth/Sex: 04/13/1971 (51 y.o. M) Treating RN: [name] Primary Care Proctor Carriker: Thereasa Distance Other Clinician: Referring Eriverto Byrnes: Thereasa Distance Treating Jasiah Buntin/Extender: Yaakov Guthrie in Treatment: 3 Electronic Signature(s) Signed: 02/10/2022 4:18:56 PM By: [name] Entered By: [name] on 02/10/2022 08:40:21 Aull, Bagnell ([id number]) -------------------------------------------------------------------------------- Multi Wound Chart Details Patient Name: [name], [name] Date of Service: 02/10/2022 8:30 AM Medical Record Number: [id number] Patient Account Number: [id number] Date of Birth/Sex: Oct 22, 1970 (51 y.o. M) Treating RN: [name] Primary Care Dennisse Swader: Thereasa Distance Other Clinician: Referring Gerado Nabers: Thereasa Distance Treating Llewellyn Schoenberger/Extender: Yaakov Guthrie in Treatment: 3 Vital Signs Height(in): 76 Pulse(bpm): 60 Weight(lbs): 404 Blood Pressure(mmHg): 170/81 Body Mass Index(BMI): 49.2 Temperature(F): 97.9 Respiratory Rate(breaths/min): 18 Photos: [N/A:N/A] Wound Location: Right Clavicle N/A N/A Wounding Event: Surgical Injury N/A N/A Primary Etiology: Dehisced Wound N/A N/A Comorbid History: Hypertension, Type II Diabetes, N/A N/A Neuropathy Date Acquired: 08/30/2020 N/A N/A Weeks of  Treatment: 3 N/A N/A Wound Status: Open N/A N/A Wound Recurrence: No N/A N/A Measurements L x W x D (cm) 0.1x0.1x0.1 N/A N/A Area (cm) : 0.008 N/A N/A Volume (cm) : 0.001 N/A N/A % Reduction in Area: 98.00% N/A N/A % Reduction in Volume: 99.80% N/A N/A Classification: Full Thickness Without Exposed N/A N/A Support Structures Exudate Amount: None Present N/A N/A Granulation Amount: None Present (0%) N/A N/A Necrotic Amount: None Present (0%) N/A N/A Exposed Structures: Fascia: No N/A N/A Fat Layer (Subcutaneous Tissue): No Tendon: No Muscle: No Joint: No Bone: No Epithelialization: None N/A N/A  Treatment Notes Electronic Signature(s) Signed: 02/10/2022 4:18:56 PM By: [name] Entered By: [name] on 02/10/2022 08:50:16 Fasnacht, Khaidyn AMarland Kitchen ([id number]) -------------------------------------------------------------------------------- Multi-Disciplinary Care Plan Details Patient Name: [name], [name] Date of Service: 02/10/2022 8:30 AM Medical Record Number: [id number] Patient Account Number: [id number] Date of Birth/Sex: 10-Feb-1971 (51 y.o. M) Treating RN: [name] Primary Care Dawanna Grauberger: Thereasa Distance Other Clinician: Referring Vernice Mannina: Thereasa Distance Treating Aquil Duhe/Extender: Yaakov Guthrie in Treatment: 3 Active Inactive Electronic Signature(s) Signed: 02/10/2022 9:05:20 AM By: [name] Entered By: [name] on 02/10/2022 09:05:19 Geil, Adisa A. ([id number]) -------------------------------------------------------------------------------- Pain Assessment Details Patient Name: [name], [name] Date of Service: 02/10/2022 8:30 AM Medical Record Number: [id number] Patient Account Number: [id number] Date of Birth/Sex: 1971-05-12 (51 y.o. M) Treating RN: [name] Primary Care Nathalie Cavendish: Thereasa Distance Other Clinician: Referring Natalin Bible: Thereasa Distance Treating Denea Cheaney/Extender: Yaakov Guthrie in Treatment:  3 Active Problems Location of Pain Severity and Description of Pain Patient Has Paino No Site Locations Rate the pain. Current Pain Level: 0 Pain Management and Medication Current Pain Management: Electronic Signature(s) Signed: 02/10/2022 4:18:56 PM By: [name] Entered By: [name] on 02/10/2022 08:37:57 [name], [name] ([id number]) -------------------------------------------------------------------------------- Patient/Caregiver Education Details Patient Name: [name], [name] Date of Service: 02/10/2022 8:30 AM Medical Record Number: [id number] Patient Account Number: [id number] Date of Birth/Gender: 1970/10/03 (51 y.o. M) Treating RN: [name] Primary Care Physician: Thereasa Distance Other Clinician: Referring Physician: Thereasa Distance Treating Physician/Extender: Yaakov Guthrie in Treatment: 3 Education Assessment Education Provided To: Patient Education Topics Provided Wound/Skin Impairment: Handouts: Other: care of healed wound Methods: Explain/Verbal Responses: State content correctly Electronic Signature(s) Signed: 02/10/2022 4:18:56 PM By: [name] Entered By: [name] on 02/10/2022 08:54:52 [name], [name] -------------------------------------------------------------------------------- Wound Assessment Details Patient Name: [name], [name] Date of Service: 02/10/2022 8:30 AM Medical Record Number: [id number] Patient Account Number: [id number] Date of Birth/Sex: Jul 21, 1971 (51 y.o. M) Treating RN: [name] Primary Care Jacarius Handel: Thereasa Distance Other Clinician: Referring Zuleika Gallus: Thereasa Distance Treating Coby Shrewsberry/Extender: Yaakov Guthrie in Treatment: 3 Wound Status Wound Number: [id number] Primary Etiology: Dehisced Wound Wound Location: Right Clavicle Wound Status: Healed - Epithelialized Wounding Event: Surgical Injury Comorbid History: Hypertension, Type II Diabetes, Neuropathy Date  Acquired: 08/30/2020 Weeks Of Treatment: 3 Clustered Wound: No Photos Wound Measurements Length: (cm) 0 Width: (cm) 0 Depth: (cm) 0 Area: (cm) 0 Volume: (cm) 0 % Reduction in Area: 100% % Reduction in Volume: 100% Epithelialization: Large (67-100%) Tunneling: No Undermining: No Wound Description Classification: Full Thickness Without Exposed Support Structure Exudate Amount: None Present s Foul Odor After Cleansing: No Slough/Fibrino No Wound Bed Granulation Amount: None Present (0%) Exposed Structure Necrotic Amount: None Present (0%) Fascia Exposed: No Fat Layer (Subcutaneous Tissue) Exposed: No Tendon Exposed: No Muscle Exposed: No Joint Exposed: No Bone Exposed: No Electronic Signature(s) Signed: 02/10/2022 4:18:56 PM By: [name] Entered By: [name] on 02/10/2022 08:52:18 Sternberg, Reice A. ([id number]) -------------------------------------------------------------------------------- Vitals Details Patient Name: Fedorko, Easten A. Date of Service: 02/10/2022 8:30 AM Medical Record Number: [id number] Patient Account Number: [id number] Date of Birth/Sex: 1971-05-01 (51 y.o. M) Treating RN: [name] Primary Care Rocklyn Mayberry: Thereasa Distance Other Clinician: Referring Donisha Hoch: Thereasa Distance Treating Asif Muchow/Extender: Yaakov Guthrie in Treatment: 3 Vital Signs Time Taken: 08:37 Temperature (F): 97.9 Height (in): 76 Pulse (bpm): 60 Weight (lbs): 404 Respiratory Rate (breaths/min): 18 Body Mass Index (BMI): 49.2 Blood Pressure (mmHg): 170/81 Reference Range: 80 - 120 mg / dl Electronic Signature(s) Signed: 02/10/2022 4:18:56 PM By: Araceli Bouche,  Caitlin Entered By: [name] on 02/10/2022 08:37:49

## 2022-02-10 NOTE — Progress Notes (Signed)
OSHUA, MCCONAHA ([id number]) Visit Report for 02/10/2022 Chief Complaint Document Details Patient Name: [name], [name] Date of Service: 02/10/2022 8:30 AM Medical Record Number: [id number] Patient Account Number: [id number] Date of Birth/Sex: [birth year] (51 y.o. M) Treating RN: [name] Primary Care Provider: Thereasa Distance Other Clinician: Referring Provider: Thereasa Distance Treating Provider/Extender: Yaakov Guthrie in Treatment: 3 Information Obtained from: Patient Chief Complaint 01/20/22; wound to previous central line catheter to the right side of the neck Electronic Signature(s) Signed: 02/10/2022 10:15:25 AM By: Kalman Shan DO Entered By: Kalman Shan on 02/10/2022 10:09:28 Stmartin, Reynold A. ([id number]) -------------------------------------------------------------------------------- HPI Details Patient Name: [name], [name] Date of Service: 02/10/2022 8:30 AM Medical Record Number: [id number] Patient Account Number: [id number] Date of Birth/Sex: 1970/10/23 (51 y.o. M) Treating RN: [name] Primary Care Provider: Thereasa Distance Other Clinician: Referring Provider: Thereasa Distance Treating Provider/Extender: Yaakov Guthrie in Treatment: 3 History of Present Illness HPI Description: 12/28/17; this is a now 51 year old man who is a type II diabetic. He was hospitalized from 10/01/17 through 10/19/17. He had an MSSA soft tissue and skin infection. 2 open areas on the left leg were identified he has a smaller area on the left medial calf superiorly just below the knee and a wound just above the left ankle on the posterior medial aspect. I think both of these were surgical IandD sites when he was in the hospital. He was discharged with a wound VAC at that point however this is since been taken off. He follows with Dr. Ola Spurr for the [facility] and he is still on chronic Keflex at 500 twice a day. At that time he was hospitalized his hemoglobin A1c was  15.1 however if I'm reading his endocrinologist notes correctly that is improved. He has been following with Dr. [name] at vein [facility] vascular and he has been applying calcium alginate and Unna boots. He has home health changing the dressing. They have also been attempting to get him external compression pumps although the patient is unaware whether they've been approved by insurance at this point. as mentioned he has a smaller clean wound on the right lateral calf just below the knee and he has a much larger area just above the left ankle medially and posteriorly. Our intake nurse reported greenish purulent looking drainage.the patient did have surgical material sent to pathology in February. This showed chronic abscess The patient also has lymphedema stage III in the left greater than right lower extremities. He has a history of blisters with wounds but these of all were always healed. The patient thinks that the lymphedema may have been present since he was about 51 years old i.e. about 30 years. He does not have graded pressure stockings and has not worn stockings. He does not have a distant history of DVT PE or phlebitis. He has not been systemically unwell fever no chills. He states that his Lasix is recently been reduced. He tells me his kidney function is at "30%" and he has been followed by Dr. [name] of nephrology. At one point he was on Lasix 80 twice a day however that's been cut back and he is now on Lasix at 20 twice a day. The patient has a history of PAD listed in his records although he comes from Dr. [name] I don't think is felt to have significant PAD. ABIs in our clinic were noncompressible bilaterally. 01/04/18; patient has a large wound on the left lateral lower calf and a small wound on the left medial  upper calf. He has been to see his nephrologist who changed him to Demadex 40 mg a day. I'm hopeful this will help with his systemic fluid overload. He also has stage  III lymphedema. Really no change in the 2 wounds since last week 01/11/18; the patient is down 13 pounds. He put his stage III lymphedema left leg in 4K compression last week and there is less edema fluid however we still haven't been able to communicate with home health but apparently it is [facility] but the dressings have not been changed. The patient noted an odor last week. He is also had compression pumps ordered by [facility] this as a not completed the paperwork stage. 01/18/18; patient continues to lose weight. Stage III lymphedema in the left greater than right leg under for alert compression. The major wound is on the left lateral ankle area. He apparently has bilateral compression pumps being brought to his house, these were ordered by [facility] Notable for the fact today he had some blisters on the right anterior leg together with some skin nodules. This is no doubt secondary to severe lymphedema. 01/25/18; the patient has obtained his compression pumps and is using them per vein and vascular instructions 3 times a day for an hour. He also saw [name] of vein and vascular. He was felt to have venous insufficiency but did not suggest any intervention also improved edema. It was suggested that he have compression stockings 20-30 mm on a daily basis in addition to compression pumps. The patient arrives in clinic today with a layer of unna under for layer compression. he seems to have some trouble with the degree of compression. He has open areas on the left lateral ankle area which is his major wound left upper medial calf and a superficial open area on the right anterior shin area which was blistered last week. He has skin changes on the right anterior calf which I think are no doubt secondary to lymphedema skin nodules etc. 02/01/18; the patient comes in telling us his nephrologist have to his torsemide. Unfortunately today he is put on 7 pounds by our scales.  He has blisters all over the anterior and medial part of his right calf and a new open wound. He also has soupy green drainage coming out of the left lateral calf /ankle wound. 02/08/18; culture I did last week of the left lateral ankle wound grew both Pseudomonas and Morganella. He is on Keflex from Dr. Ola Spurr in the hospital. I will need to review these notes.in any case Keflex is not going to cover these 2 organisms. I'm probably going to added ciprofloxacin today for 1 week. A lot of drainage that looks purulent last week. He is not complaining of pain however he has managed to put on 10 pounds in 2 weeks by our scales in this clinic. He is going to see his nephrologist tomorrow 02/15/18; he completed the ciprofloxacin I gave him last week. Notable that he is up to 379 pounds today which is up 16 pounds from 2 weeks ago. He is complaining of orthopnea but doesn't have any chest pain. 02/22/18; he continues to have weight gain. R intake nurse reports again purulent green drainage coming out of the lateral wound on the lateral left calf. He has small open area on the right anterior leg. His torsemide was increased to 2 tablets a day I believe this is 40 mg last week in response to the call admitted to  Dr. Keturah Barre office. He follows up with Dr. [name] and Dr. Delana Meyer tomorrow 03/01/18 his weight essentially stable today at 383 pounds. Drainage out of the left lateral wound on the lower left calf/ankle is a lot less. Culture last time grew Pseudomonas. I put him on cefdinir. He has been to Dr. Keturah Barre office no adjustments in his diuretics. Dr. Nicoletta Dress prescribed a wraparound stocking for the right leg.there is no open area on the right leg. He has a superficial area on the left medial calf, left posterior calf and in the large area on the left lateral however this looks better 03/15/18; weight is not up to 393 pounds. He saw his nephrologist yesterday Dr. [name] will increase the Demadex I'm hopeful this  will help with the edema control. I'm using silver alginate to all his wounds. In particular the left lateral ankle looks better. oUnfortunately he has new open areas on the right lateral calf that will include use of his compression stocking at least in the short to medium term. He has new wounds o3 on the right lateral calf. One of these has some size however all numerous superficial 03/22/18; his weight is stabilized a bit. Just adjustment of his diuretics by his nephrologist. There is no doubt he has some degree of systemic fluid overload on top of severe left greater than right lymphedema. Amendola, Lois A. ([id number]) 2 weeks ago tried to transition him to stockings on the right leg however he developed re-breakdown of skin on the right leg and we had to put him back in compression last week. He also uses external compression pumps and claims to be compliant Continued concern about his depression today 03/29/18; several ongoing issues with this patient; oHe no longer has home health coverage apparently secondary to a lapse in insurance. He is apparently transitioning from short for long-term disability. [facility] was changing his compression wraps on Monday and Friday. oHe has gained 10 pounds since last week oSees Dr. [name] tomorrow oSaw his primary doctor last week about the depression. It sounds as though he declined pharmacologic management. He seems somewhat better today. We were concerned last week when he came. Somewhat better today oHe is using his compression pumps once a day at home, I have asked for twice a day if possible especially on the left leg oFollows up with his nephrologist in mid-August. He is managing his diuretic for I think stage IV chronic renal failure oParadoxically his wounds actually look better 04/19/18; the patient has not been seen since I last saw him 3 weeks ago. He saw Dr. Delana Meyer of vascular surgery on 03/30/18 I believe he put him in a 20/30 stocking  bilaterally with a wraparound extremitease stocking. He has not been putting anything specifically on the wound. More problematic than that he has not been wearing the stockings he is at home. He has been using his external compression pumps once per day according to him on a rare occasion twice On a psychosocial level the patient is now on long-term disability and is applying for COBRA therefore he is between insurances. He has not been able to follow up with Dr. [name] who is his nephrologist as a result. As noted his weight is up to 407 pounds today. He promises me he'll follow-up with Dr. [name] 05/03/18; he hasn't been here in 2 weeks now. He apparently has been wearing a compression sock on the left leg. Massive increase in edema 3 large open wounds on the left anterior leg  that were probably blisters. Significant deterioration in the left lateral ankle wound that we've been doing as his most problematic wound. He still does not have his insurance issues wrapped up. His weight is well over 400 pounds. 05/10/18; arrives today with better looking edema control in the left leg. He has been using his compression pumps twice a day. We also wrapped it is left leg for there he's been using his pumps so there is much better edema control. Most of new wounds from last week look a lot better. Even the refractory area on the lower left lateral ankle looks a lot better to me today. He follows up with Dr. [name] this morning [nephrology] 05/17/18 patient arrives today with a lot less edema in the left leg. His weight is gone down 7 pounds. He tells me he saw Dr. [name] but his torsemide was not adjusted. He is using the palms 3 times a day. There is been quite an improvement in the remaining wound on the left lateral ankle oWeekly visit for follow-up of bilateral lower extremity wounds related to severe lymphedema and probably some degree of systemic fluid overload from chronic renal failure stage IV. His weight is  up this week to over 400 pounds. He noted increasing edema in the right leg late last week. He took his compression stocking off he did not increase the frequency of this compression pump use. He developed a large blister on the back of the right calf. He also has a new opened blister on the left lateral calf in addition to the wound that we've been using on the distal left lower calf area and we've been using silver alginate. He tells me that he has an ultrasound which is a DVT rule out and I think a reflux study ordered by Dr. [name] 05/31/18; weekly visit. He went to see vein and vascular this week apparently they remove the 4-layer compression on the left and put an Unna boot on him in replacement. He's got more swelling in the left leg is resolved. That being said his left lower Wound is better. He still has a fairly large wound on the right posterior calf this was not disturbed. He has a follow-up appointment with Dr. Keturah Barre nephrologist next week 06/07/2018; the patient arrives today with a history that he took both his compression wraps off 3 days ago in order to take a shower. He has bilateral severe tense blisters. A lot of weeping erythema especially on the lateral left calf. Almost circumferential blisters on the right. Multiple areas of epithelial breakdown. He does not complain of any pain fever chills. He states he has been using his compression pump in some form of stocking that I could not really determine the type. He did not come into the clinic with anything on his legs. He tells me he has an appointment with Dr. Merita Norton his nephrologist I believe this Friday. Paradoxically his weight is actually down to 385 pounds I believe last week he was over 400 06/14/18; arrives with better looking wound surfaces today on both legs. There is no further blistering however there are still areas that aren't epithelialized on the right anterior, right lateral, left posterior and left medial. His  original wound just above the left ankle laterally is still open moist. His weight was about the same today. He tells me that his nephrologist increase the torsemide from 40/20/09/1939/40 twice a day 06/21/18; both his wraps fell down to his mid calf. Has a result he  has multiple blisters across the anterior right leg above with the wraps ultimately watched. He also has blisters on the posterior part of the left calf o2. His original wound on the left lateral calf is hard to see any open area. There is however a divot. Which is going to be difficult to deal with into the future until the edema in the left leg is controlled. 2 small superficial areas remain He tells [place] his weight was 386 pounds on his scale at home. According to our scale he is up 5 pounds. He is not on a fluid restriction 06/28/18; patient's weight is gone up 2 pounds since last time. He has weeping edema and open superficial wounds on the right posterior right lateral and right anterior calf. He has a small open area on the left anterior probably left posterior calf and the original wound on the left lateral calf appears to be just about closed He is being planned for a dialysis shunt in the right arm through [facility] incoordination with his nephrologist Dr. [name] He claims to be using his compression pumps twice a day. He has lymphedema and no doubt systemic fluid volume overload from stage IV chronic renal failure 07/04/18; patient's weight is up into the 396 range. He is supposed to see his cardiologist tomorrow and plans are being made for a shunt by Dr. Delana Meyer. He still has significant open wounds/draining areas on the right anterior and right posterior calf. Several small open areas on the left which are less in terms of wound area on the right which is surprising given the fact the left is the larger most lymphedematous leg 07/26/2018 She is seen today for follow-up and management of lateral posterior lower leg wound  and bilateral lymphoedema. He has a very flat affect and depressed mood today. Unable to elicit much information from him today. No thoughts of harm to self identified. Recently had a follow- up with vascular vein for fistula placement to initiate dialysis in the right arm. He has 2 dressings on the right arm from the fistula procedure with no bleeding or drainage noted on the dressing. He does have a large amount of bruising in the surrounding to puncture areas on the right lower arm from the fistula placement. No pain elicited with palpation of the right arm. He denies a diminished sensation in that right lower arm as well. He does have a small wound to the left lateral posterior lower leg. No visual drainage present to either leg. However the wound to the left lower leg does have a foul odor even after cleaning it. The left is the larger most lymphedematous leg with the right Leg still having a significant amount of edema. No drainage or blisters present on either leg today. He states that he is using the lymphedema pumps twice a day. Not too sure if he is compliant with actually using the lymphedema pumps based on the amount of edema that he has in his legs. Weight increased from 396.1 to 401.6. No recent fevers, chills, or shortness of breath. Janusz, Avik A. ([id number]) 08/02/18; the patient only has one remaining wound on the left lateral calf which is still open. This is in the resultant [place] shaped area which was once the site of the major wound. He does not have any open areas on the right leg nor additional wounds on the left no blistering. As usual the left leg is much larger than the right. He comes in today stating  that he took the wraps off on the right leg on Friday and since then he has presumably been wearing his stocking which is a wraparound variant stocking on the right. States he took the 4-layer compression off his left leg this morning to shower. He is seeing vein  and vascular tomorrow. He states he is using his compression pumps once or twice a day. He also admits that he has been on his feet a lot 08/09/18; the patient has the left leg healed except for the original wound site on the left medial Just above the ankle. On the right he has a new wound on the right lateral calf. He saw vein and vascular last week and he is been back in his pressure stockings and wraparound stockings. He also is been put on metolazone 3 times a week by nephrology along with his torsemide. I'm hopeful that this will help with some of the lower extremity edema. I've always felt that he has lymphedema but there may be a secondary component contributing 08/16/18; the patient used his own junk slight stockings to both legs last week. He claims he is pumping once to twice a day at home. Here rise with his legs looking visibly less edematous. His usual left greater than right secondary to lymphedema. He is tolerating the metolazone 3 times a week along with the torsemide. His weight was down 2 pounds. He follows up with nephrology soon for follow-up lab work I think in early January. He does not have any open areas on the right leg he still has the crevice on the lateral left calf just above the ankle there is 2 small open areas here I am not sure that this is ever going to be free of an open area although certainly not worsening. More problematic lead he has 2 blistered areas just above the wrist on the left lateral calf 09/06/2018; the patient arrives with much larger legs than I remember seeing a month ago especially on the right. He has 1 open area on the left lateral calf and he had a 4 layer compression on this on arrival in the clinic. This was apparently put on by [facility] in response to a new open wound. He claims he is pumping once or more often twice a day although I really have a hard time believing it. He came in with nothing but stockings on his legs. There is tremendous  increase in the edema bilaterally 1/22; the patient only has the one area that is not totally closed and that is the divot on the left lateral calf. I do not think we are going to be able to do anything further to this unless he loses edema fluid in his left leg when he starts dialysis which I gather is sometime soon. He is using his own wraparound compression garment. He is using the compression pumps twice a day. 2/19; the patient does not have any open wounds on the right leg but both legs left and the right have extensive edema is usually worse on the left. He has a tense blister on the left anterior leg. Using his compression pumps usually once a day per his description. He does not come in with the compression stockings 2/26. Right leg remains without any open wounds that he has his own stocking. The tense blister on the left anterior leg is closed over. He still has the open area on the left lateral mid calf and then the divot injury area which I  do not think is going to heal just above the left ankle. 3/11; the patient arrives with all the wounds on both legs healed. He has his stocking with the wraparound juxta lite on the right. He has 1 for the left. He initiated dialysis on Monday and we will see what effect that has on his lower extremity edema. He has his compression pumps and he plans to use them at least once a day Readmission 01/20/2022 [name] [name] is a 51 year old male with a past medical history of type 1 diabetes on hemodialysis that presents to the clinic for a 1 year history of wound to the right side of his neck. He states that he had a central line in place when he started hemodialysis while his graft matured. He states that the area where the line was removed never healed. He reports serosanguineous drainage and odor to the area over the past year. He has not taken antibiotics for this issue. He currently denies systemic signs of infection. He currently keeps the area  covered with a Band-Aid. 5/31; patient presents for follow-up. He has been taking doxycycline without issues. He had a culture done at last clinic visit that showed abundant Streptococcus anginosus sensitive to penicillin. I will go ahead and switch him to Augmentin. He has been using mupirocin ointment to the wound bed and covering this with Hydrofera Blue. Upon inspection today there is a piece of dressing tightly adhered in the wound bed that appears to be connected to underlying structures. He obtains his neck ultrasound tomorrow. He denies systemic signs of infection. 6/14; Patient presents for follow-up. Patient had an ultrasound to his neck that showed a prior PermCath still present is a retained foreign body. This was causing his wound not to heal. On 6/7 the foreign body was removed by Dr.Schnier. Since then the area has healed up nicely. He has no issues or complaints today. He denies any drainage, open wounds or discomfort to the previous wound site. Electronic Signature(s) Signed: 02/10/2022 10:15:25 AM By: Kalman Shan DO Entered By: Kalman Shan on 02/10/2022 10:11:17 Asman, [name] AMarland [name] ([id number]) -------------------------------------------------------------------------------- Physical Exam Details Patient Name: Carreras, Aengus A. Date of Service: 02/10/2022 8:30 AM Medical Record Number: [id number] Patient Account Number: [id number] Date of Birth/Sex: [birth year] (51 y.o. M) Treating RN: [name] Primary Care Provider: Thereasa Distance Other Clinician: Referring Provider: Thereasa Distance Treating Provider/Extender: Yaakov Guthrie in Treatment: 3 Constitutional . Psychiatric . Notes Right side of the neck: Epithelization to the previous wound site. No fluctuance to the area, open wounds, tenderness or drainage noted. No signs of infection. Electronic Signature(s) Signed: 02/10/2022 10:15:25 AM By: Kalman Shan DO Entered By: Kalman Shan on  02/10/2022 10:12:17 Rickert, Tayo AMarland [name] ([id number]) -------------------------------------------------------------------------------- Physician Orders Details Patient Name: [name], [name] Date of Service: 02/10/2022 8:30 AM Medical Record Number: [id number] Patient Account Number: [id number] Date of Birth/Sex: 1970/12/02 (52 y.o. M) Treating RN: [name] Primary Care Provider: Thereasa Distance Other Clinician: Referring Provider: Thereasa Distance Treating Provider/Extender: Yaakov Guthrie in Treatment: 3 Verbal / Phone Orders: No Diagnosis Coding Discharge From [facility] o Discharge from [facility] - Please call with any issues to healed wound. Electronic Signature(s) Signed: 02/10/2022 10:15:25 AM By: Kalman Shan DO Entered By: Kalman Shan on 02/10/2022 10:14:46 Stell, Tahj A. ([id number]) -------------------------------------------------------------------------------- Problem List Details Patient Name: [name], [name] Date of Service: 02/10/2022 8:30 AM Medical Record Number: [id number] Patient Account Number: [id number] Date of Birth/Sex: [birth year] (51 y.o. M)  Treating RN: [name] Primary Care Provider: Thereasa Distance Other Clinician: Referring Provider: Thereasa Distance Treating Provider/Extender: Yaakov Guthrie in Treatment: 3 Active Problems ICD-10 Encounter Code Description Active Date MDM Diagnosis S11.80XA Unspecified open wound of other specified part of neck, initial encounter 02/10/2022 No Yes E10.622 Type 1 diabetes mellitus with other skin ulcer 01/20/2022 No Yes N18.6 End stage renal disease 01/20/2022 No Yes E10.39 Type 1 diabetes mellitus with other diabetic ophthalmic complication 5/64/3329 No Yes Inactive Problems Resolved Problems Electronic Signature(s) Signed: 02/10/2022 10:15:25 AM By: Kalman Shan DO Entered By: Kalman Shan on 02/10/2022 10:09:25 Madril, Junior A.  ([id number]) -------------------------------------------------------------------------------- Progress Note Details Patient Name: [name], [name] Date of Service: 02/10/2022 8:30 AM Medical Record Number: [id number] Patient Account Number: [id number] Date of Birth/Sex: [birth year] (51 y.o. M) Treating RN: [name] Primary Care Provider: Thereasa Distance Other Clinician: Referring Provider: Thereasa Distance Treating Provider/Extender: Yaakov Guthrie in Treatment: 3 Subjective Chief Complaint Information obtained from Patient 01/20/22; wound to previous central line catheter to the right side of the neck History of Present Illness (HPI) 12/28/17; this is a now 51 year old man who is a type II diabetic. He was hospitalized from 10/01/17 through 10/19/17. He had an MSSA soft tissue and skin infection. 2 open areas on the left leg were identified he has a smaller area on the left medial calf superiorly just below the knee and a wound just above the left ankle on the posterior medial aspect. I think both of these were surgical IandD sites when he was in the hospital. He was discharged with a wound VAC at that point however this is since been taken off. He follows with Dr. Ola Spurr for the [facility] and he is still on chronic Keflex at 500 twice a day. At that time he was hospitalized his hemoglobin A1c was 15.1 however if I'm reading his endocrinologist notes correctly that is improved. He has been following with Dr. [name] at vein and vascular and he has been applying calcium alginate and Unna boots. He has home health changing the dressing. They have also been attempting to get him external compression pumps although the patient is unaware whether they've been approved by insurance at this point. as mentioned he has a smaller clean wound on the right lateral calf just below the knee and he has a much larger area just above the left ankle medially and posteriorly. Our intake nurse reported  greenish purulent looking drainage.the patient did have surgical material sent to pathology in February. This showed chronic abscess The patient also has lymphedema stage III in the left greater than right lower extremities. He has a history of blisters with wounds but these of all were always healed. The patient thinks that the lymphedema may have been present since he was about 51 years old i.e. about 30 years. He does not have graded pressure stockings and has not worn stockings. He does not have a distant history of DVT PE or phlebitis. He has not been systemically unwell fever no chills. He states that his Lasix is recently been reduced. He tells me his kidney function is at "30%" and he has been followed by Dr. [name] of nephrology. At one point he was on Lasix 80 twice a day however that's been cut back and he is now on Lasix at 20 twice a day. The patient has a history of PAD listed in his records although he comes from Dr. [name] I don't think is felt to have significant PAD. ABIs  in our clinic were noncompressible bilaterally. 01/04/18; patient has a large wound on the left lateral lower calf and a small wound on the left medial upper calf. He has been to see his nephrologist who changed him to Demadex 40 mg a day. I'm hopeful this will help with his systemic fluid overload. He also has stage III lymphedema. Really no change in the 2 wounds since last week 01/11/18; the patient is down 13 pounds. He put his stage III lymphedema left leg in 4K compression last week and there is less edema fluid however we still haven't been able to communicate with home health but apparently it is [facility] but the dressings have not been changed. The patient noted an odor last week. He is also had compression pumps ordered by [facility] this as a not completed the paperwork stage. 01/18/18; patient continues to lose weight. Stage III lymphedema in the left greater than right leg under for alert  compression. The major wound is on the left lateral ankle area. He apparently has bilateral compression pumps being brought to his house, these were ordered by [facility] Notable for the fact today he had some blisters on the right anterior leg together with some skin nodules. This is no doubt secondary to severe lymphedema. 01/25/18; the patient has obtained his compression pumps and is using them per vein and vascular instructions 3 times a day for an hour. He also saw [name] of vein and vascular. He was felt to have venous insufficiency but did not suggest any intervention also improved edema. It was suggested that he have compression stockings 20-30 mm on a daily basis in addition to compression pumps. The patient arrives in clinic today with a layer of unna under for layer compression. he seems to have some trouble with the degree of compression. He has open areas on the left lateral ankle area which is his major wound left upper medial calf and a superficial open area on the right anterior shin area which was blistered last week. He has skin changes on the right anterior calf which I think are no doubt secondary to lymphedema skin nodules etc. 02/01/18; the patient comes in telling us his nephrologist have to his torsemide. Unfortunately today he is put on 7 pounds by our scales. He has blisters all over the anterior and medial part of his right calf and a new open wound. He also has soupy green drainage coming out of the left lateral calf /ankle wound. 02/08/18; culture I did last week of the left lateral ankle wound grew both Pseudomonas and Morganella. He is on Keflex from Dr. Ola Spurr in the hospital. I will need to review these notes.in any case Keflex is not going to cover these 2 organisms. I'm probably going to added ciprofloxacin today for 1 week. A lot of drainage that looks purulent last week. He is not complaining of pain however he has managed to put on 10 pounds in  2 weeks by our scales in this clinic. He is going to see his nephrologist tomorrow 02/15/18; he completed the ciprofloxacin I gave him last week. Notable that he is up to 379 pounds today which is up 16 pounds from 2 weeks ago. He is complaining of orthopnea but doesn't have any chest pain. 02/22/18; he continues to have weight gain. R intake nurse reports again purulent green drainage coming out of the lateral wound on the lateral left calf. He has small open area on the right  anterior leg. His torsemide was increased to 2 tablets a day I believe this is 40 mg last week in response to the call admitted to Dr. Keturah Barre office. He follows up with Dr. [name] and Dr. Delana Meyer tomorrow 03/01/18 his weight essentially stable today at 383 pounds. Drainage out of the left lateral wound on the lower left calf/ankle is a lot less. Culture last time grew Pseudomonas. I put him on cefdinir. He has been to Dr. Keturah Barre office no adjustments in his diuretics. Dr. Nicoletta Dress prescribed a wraparound stocking for the right leg.there is no open area on the right leg. He has a superficial area on the left medial calf, left posterior calf and in the large area on the left lateral however this looks better 03/15/18; weight is not up to 393 pounds. He saw his nephrologist yesterday Dr. [name] will increase the Demadex I'm hopeful this will help with [name], [name] ([id number]) the edema control. I'm using silver alginate to all his wounds. In particular the left lateral ankle looks better. Unfortunately he has new open areas on the right lateral calf that will include use of his compression stocking at least in the short to medium term. He has new wounds o3 on the right lateral calf. One of these has some size however all numerous superficial 03/22/18; his weight is stabilized a bit. Just adjustment of his diuretics by his nephrologist. There is no doubt he has some degree of systemic fluid overload on top of severe left greater than  right lymphedema. 2 weeks ago tried to transition him to stockings on the right leg however he developed re-breakdown of skin on the right leg and we had to put him back in compression last week. He also uses external compression pumps and claims to be compliant Continued concern about his depression today 03/29/18; several ongoing issues with this patient; He no longer has home health coverage apparently secondary to a lapse in insurance. He is apparently transitioning from short for long-term disability. [facility] was changing his compression wraps on Monday and Friday. He has gained 10 pounds since last week Sees Dr. [name] tomorrow Saw his primary doctor last week about the depression. It sounds as though he declined pharmacologic management. He seems somewhat better today. We were concerned last week when he came. Somewhat better today He is using his compression pumps once a day at home, I have asked for twice a day if possible especially on the left leg Follows up with his nephrologist in mid-August. He is managing his diuretic for I think stage IV chronic renal failure Paradoxically his wounds actually look better 04/19/18; the patient has not been seen since I last saw him 3 weeks ago. He saw Dr. Delana Meyer of vascular surgery on 03/30/18 I believe he put him in a 20/30 stocking bilaterally with a wraparound extremitease stocking. He has not been putting anything specifically on the wound. More problematic than that he has not been wearing the stockings he is at home. He has been using his external compression pumps once per day according to him on a rare occasion twice On a psychosocial level the patient is now on long-term disability and is applying for COBRA therefore he is between insurances. He has not been able to follow up with Dr. [name] who is his nephrologist as a result. As noted his weight is up to 407 pounds today. He promises me he'll follow-up with Dr. [name] 05/03/18; he  hasn't been here in 2 weeks  now. He apparently has been wearing a compression sock on the left leg. Massive increase in edema 3 large open wounds on the left anterior leg that were probably blisters. Significant deterioration in the left lateral ankle wound that we've been doing as his most problematic wound. He still does not have his insurance issues wrapped up. His weight is well over 400 pounds. 05/10/18; arrives today with better looking edema control in the left leg. He has been using his compression pumps twice a day. We also wrapped it is left leg for there he's been using his pumps so there is much better edema control. Most of new wounds from last week look a lot better. Even the refractory area on the lower left lateral ankle looks a lot better to me today. He follows up with Dr. [name] this morning [nephrology] 05/17/18 patient arrives today with a lot less edema in the left leg. His weight is gone down 7 pounds. He tells me he saw Dr. [name] but his torsemide was not adjusted. He is using the palms 3 times a day. There is been quite an improvement in the remaining wound on the left lateral ankle Weekly visit for follow-up of bilateral lower extremity wounds related to severe lymphedema and probably some degree of systemic fluid overload from chronic renal failure stage IV. His weight is up this week to over 400 pounds. He noted increasing edema in the right leg late last week. He took his compression stocking off he did not increase the frequency of this compression pump use. He developed a large blister on the back of the right calf. He also has a new opened blister on the left lateral calf in addition to the wound that we've been using on the distal left lower calf area and we've been using silver alginate. He tells me that he has an ultrasound which is a DVT rule out and I think a reflux study ordered by Dr. [name] 05/31/18; weekly visit. He went to see vein and vascular this week  apparently they remove the 4-layer compression on the left and put an Unna boot on him in replacement. He's got more swelling in the left leg is resolved. That being said his left lower Wound is better. He still has a fairly large wound on the right posterior calf this was not disturbed. He has a follow-up appointment with Dr. Keturah Barre nephrologist next week 06/07/2018; the patient arrives today with a history that he took both his compression wraps off 3 days ago in order to take a shower. He has bilateral severe tense blisters. A lot of weeping erythema especially on the lateral left calf. Almost circumferential blisters on the right. Multiple areas of epithelial breakdown. He does not complain of any pain fever chills. He states he has been using his compression pump in some form of stocking that I could not really determine the type. He did not come into the clinic with anything on his legs. He tells me he has an appointment with Dr. Merita Norton his nephrologist I believe this Friday. Paradoxically his weight is actually down to 385 pounds I believe last week he was over 400 06/14/18; arrives with better looking wound surfaces today on both legs. There is no further blistering however there are still areas that aren't epithelialized on the right anterior, right lateral, left posterior and left medial. His original wound just above the left ankle laterally is still open moist. His weight was about the same today. He tells me  that his nephrologist increase the torsemide from 40/20/09/1939/40 twice a day 06/21/18; both his wraps fell down to his mid calf. Has a result he has multiple blisters across the anterior right leg above with the wraps ultimately watched. He also has blisters on the posterior part of the left calf o2. His original wound on the left lateral calf is hard to see any open area. There is however a divot. Which is going to be difficult to deal with into the future until the edema in the left  leg is controlled. 2 small superficial areas remain He tells [place] his weight was 386 pounds on his scale at home. According to our scale he is up 5 pounds. He is not on a fluid restriction 06/28/18; patient's weight is gone up 2 pounds since last time. He has weeping edema and open superficial wounds on the right posterior right lateral and right anterior calf. He has a small open area on the left anterior probably left posterior calf and the original wound on the left lateral calf appears to be just about closed He is being planned for a dialysis shunt in the right arm through [facility] incoordination with his nephrologist Dr. [name] He claims to be using his compression pumps twice a day. He has lymphedema and no doubt systemic fluid volume overload from stage IV chronic renal failure 07/04/18; patient's weight is up into the 396 range. He is supposed to see his cardiologist tomorrow and plans are being made for a shunt by Dr. Delana Meyer. He still has significant open wounds/draining areas on the right anterior and right posterior calf. Several small open areas on the left which are less in terms of wound area on the right which is surprising given the fact the left is the larger most lymphedematous leg 07/26/2018 She is seen today for follow-up and management of lateral posterior lower leg wound and bilateral lymphoedema. He has a very flat affect and depressed mood today. Unable to elicit much information from him today. No thoughts of harm to self identified. Recently had a follow- up with vascular vein for fistula placement to initiate dialysis in the right arm. He has 2 dressings on the right arm from the fistula procedure with no bleeding or drainage noted on the dressing. He does have a large amount of bruising in the surrounding to puncture areas on the right lower arm from the fistula placement. No pain elicited with palpation of the right arm. He denies a diminished sensation in that  right lower arm as well. Bass, Nirav A. ([id number]) He does have a small wound to the left lateral posterior lower leg. No visual drainage present to either leg. However the wound to the left lower leg does have a foul odor even after cleaning it. The left is the larger most lymphedematous leg with the right Leg still having a significant amount of edema. No drainage or blisters present on either leg today. He states that he is using the lymphedema pumps twice a day. Not too sure if he is compliant with actually using the lymphedema pumps based on the amount of edema that he has in his legs. Weight increased from 396.1 to 401.6. No recent fevers, chills, or shortness of breath. 08/02/18; the patient only has one remaining wound on the left lateral calf which is still open. This is in the resultant [place] shaped area which was once the site of the major wound. He does not have any open areas on the  right leg nor additional wounds on the left no blistering. As usual the left leg is much larger than the right. He comes in today stating that he took the wraps off on the right leg on Friday and since then he has presumably been wearing his stocking which is a wraparound variant stocking on the right. States he took the 4-layer compression off his left leg this morning to shower. He is seeing vein and vascular tomorrow. He states he is using his compression pumps once or twice a day. He also admits that he has been on his feet a lot 08/09/18; the patient has the left leg healed except for the original wound site on the left medial Just above the ankle. On the right he has a new wound on the right lateral calf. He saw vein and vascular last week and he is been back in his pressure stockings and wraparound stockings. He also is been put on metolazone 3 times a week by nephrology along with his torsemide. I'm hopeful that this will help with some of the lower extremity edema. I've always felt that he has  lymphedema but there may be a secondary component contributing 08/16/18; the patient used his own junk slight stockings to both legs last week. He claims he is pumping once to twice a day at home. Here rise with his legs looking visibly less edematous. His usual left greater than right secondary to lymphedema. He is tolerating the metolazone 3 times a week along with the torsemide. His weight was down 2 pounds. He follows up with nephrology soon for follow-up lab work I think in early January. He does not have any open areas on the right leg he still has the crevice on the lateral left calf just above the ankle there is 2 small open areas here I am not sure that this is ever going to be free of an open area although certainly not worsening. More problematic lead he has 2 blistered areas just above the wrist on the left lateral calf 09/06/2018; the patient arrives with much larger legs than I remember seeing a month ago especially on the right. He has 1 open area on the left lateral calf and he had a 4 layer compression on this on arrival in the clinic. This was apparently put on by [facility] in response to a new open wound. He claims he is pumping once or more often twice a day although I really have a hard time believing it. He came in with nothing but stockings on his legs. There is tremendous increase in the edema bilaterally 1/22; the patient only has the one area that is not totally closed and that is the divot on the left lateral calf. I do not think we are going to be able to do anything further to this unless he loses edema fluid in his left leg when he starts dialysis which I gather is sometime soon. He is using his own wraparound compression garment. He is using the compression pumps twice a day. 2/19; the patient does not have any open wounds on the right leg but both legs left and the right have extensive edema is usually worse on the left. He has a tense blister on the left anterior leg.  Using his compression pumps usually once a day per his description. He does not come in with the compression stockings 2/26. Right leg remains without any open wounds that he has his own stocking. The tense blister on the  left anterior leg is closed over. He still has the open area on the left lateral mid calf and then the divot injury area which I do not think is going to heal just above the left ankle. 3/11; the patient arrives with all the wounds on both legs healed. He has his stocking with the wraparound juxta lite on the right. He has 1 for the left. He initiated dialysis on Monday and we will see what effect that has on his lower extremity edema. He has his compression pumps and he plans to use them at least once a day Readmission 01/20/2022 [name] Worlds is a 51 year old male with a past medical history of type 1 diabetes on hemodialysis that presents to the clinic for a 1 year history of wound to the right side of his neck. He states that he had a central line in place when he started hemodialysis while his graft matured. He states that the area where the line was removed never healed. He reports serosanguineous drainage and odor to the area over the past year. He has not taken antibiotics for this issue. He currently denies systemic signs of infection. He currently keeps the area covered with a Band-Aid. 5/31; patient presents for follow-up. He has been taking doxycycline without issues. He had a culture done at last clinic visit that showed abundant Streptococcus anginosus sensitive to penicillin. I will go ahead and switch him to Augmentin. He has been using mupirocin ointment to the wound bed and covering this with Hydrofera Blue. Upon inspection today there is a piece of dressing tightly adhered in the wound bed that appears to be connected to underlying structures. He obtains his neck ultrasound tomorrow. He denies systemic signs of infection. 6/14; Patient presents for follow-up.  Patient had an ultrasound to his neck that showed a prior PermCath still present is a retained foreign body. This was causing his wound not to heal. On 6/7 the foreign body was removed by Dr.Schnier. Since then the area has healed up nicely. He has no issues or complaints today. He denies any drainage, open wounds or discomfort to the previous wound site. Objective Constitutional Vitals Time Taken: 8:37 AM, Height: 76 in, Weight: 404 lbs, BMI: 49.2, Temperature: 97.9 F, Pulse: 60 bpm, Respiratory Rate: 18 breaths/min, Blood Pressure: 170/81 mmHg. General Notes: Right side of the neck: Epithelization to the previous wound site. No fluctuance to the area, open wounds, tenderness or drainage noted. No signs of infection. Integumentary (Hair, Skin) Wound #19 status is Healed - Epithelialized. Original cause of wound was Surgical Injury. The date acquired was: 08/30/2020. The wound has been in treatment 3 weeks. The wound is located on the Right Clavicle. The wound measures 0cm length x 0cm width x 0cm depth; 0cm^2 area and 0cm^3 volume. There is no tunneling or undermining noted. There is a none present amount of drainage noted. There is no granulation within the wound bed. There is no necrotic tissue within the wound bed. Vickrey, Crayton A. 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 Delana Meyer on 6/7. Since this was removed the wound to the right neck has healed. Nothing further to do. He may follow-up as needed. Plan Discharge From [facility] Services: Discharge from [facility] Complete - Please  call with any issues to healed wound. 1. Discharge from clinic due to closed wound 2. Follow-up as needed Electronic  Signature(s) Signed: 02/10/2022 10:15:25 AM By: Kalman Shan DO Entered By: Kalman Shan on 02/10/2022 10:14:26 [name], [name] ([id number]) -------------------------------------------------------------------------------- SuperBill Details Patient Name: [name], [name] Date of Service: 02/10/2022 Medical Record Number: [id number] Patient Account Number: [id number] Date of Birth/Sex: November 18, 1970 (51 y.o. 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 Facility Procedures CPT4 Code: 31594585 Description: [phone] - WOUND CARE VISIT-LEV 2 EST PT Modifier: Quantity: 1 Physician Procedures CPT4 Code: 4628638 Description: 17711 - WC PHYS LEVEL 3 - EST PT Modifier: Quantity: 1 CPT4 Code: Description: ICD-10 Diagnosis Description S11.80XA Unspecified open wound of other specified part of neck, initial encoun E10.622 Type 1 diabetes mellitus with other skin ulcer N18.6 End stage renal disease E10.39 Type 1 diabetes mellitus with other  diabetic ophthalmic complication Modifier: ter Quantity: Electronic Signature(s) Signed: 02/10/2022 10:15:25 AM By: Kalman Shan DO Entered By: Kalman Shan on 02/10/2022 10:14:39

## 2022-02-11 DIAGNOSIS — Z5181 Encounter for therapeutic drug level monitoring: Secondary | ICD-10-CM | POA: Diagnosis not present

## 2022-02-11 DIAGNOSIS — Z7901 Long term (current) use of anticoagulants: Secondary | ICD-10-CM | POA: Diagnosis not present

## 2022-02-11 DIAGNOSIS — N186 End stage renal disease: Secondary | ICD-10-CM | POA: Diagnosis not present

## 2022-02-11 DIAGNOSIS — Z992 Dependence on renal dialysis: Secondary | ICD-10-CM | POA: Diagnosis not present

## 2022-02-11 DIAGNOSIS — N2581 Secondary hyperparathyroidism of renal origin: Secondary | ICD-10-CM | POA: Diagnosis not present

## 2022-02-13 DIAGNOSIS — Z992 Dependence on renal dialysis: Secondary | ICD-10-CM | POA: Diagnosis not present

## 2022-02-13 DIAGNOSIS — N186 End stage renal disease: Secondary | ICD-10-CM | POA: Diagnosis not present

## 2022-02-13 DIAGNOSIS — N2581 Secondary hyperparathyroidism of renal origin: Secondary | ICD-10-CM | POA: Diagnosis not present

## 2022-02-16 DIAGNOSIS — N2581 Secondary hyperparathyroidism of renal origin: Secondary | ICD-10-CM | POA: Diagnosis not present

## 2022-02-16 DIAGNOSIS — Z992 Dependence on renal dialysis: Secondary | ICD-10-CM | POA: Diagnosis not present

## 2022-02-16 DIAGNOSIS — N186 End stage renal disease: Secondary | ICD-10-CM | POA: Diagnosis not present

## 2022-02-17 ENCOUNTER — Encounter: Payer: Medicare HMO | Admitting: Internal Medicine

## 2022-02-17 NOTE — Telephone Encounter (Signed)
Attempted to reach pt again. LMOM for patient to please return my call.

## 2022-02-18 DIAGNOSIS — Z992 Dependence on renal dialysis: Secondary | ICD-10-CM | POA: Diagnosis not present

## 2022-02-18 DIAGNOSIS — N2581 Secondary hyperparathyroidism of renal origin: Secondary | ICD-10-CM | POA: Diagnosis not present

## 2022-02-18 DIAGNOSIS — N186 End stage renal disease: Secondary | ICD-10-CM | POA: Diagnosis not present

## 2022-02-18 DIAGNOSIS — Z5181 Encounter for therapeutic drug level monitoring: Secondary | ICD-10-CM | POA: Diagnosis not present

## 2022-02-18 DIAGNOSIS — Z7901 Long term (current) use of anticoagulants: Secondary | ICD-10-CM | POA: Diagnosis not present

## 2022-02-19 ENCOUNTER — Encounter: Payer: Self-pay | Admitting: Vascular Surgery

## 2022-02-20 DIAGNOSIS — N2581 Secondary hyperparathyroidism of renal origin: Secondary | ICD-10-CM | POA: Diagnosis not present

## 2022-02-20 DIAGNOSIS — N186 End stage renal disease: Secondary | ICD-10-CM | POA: Diagnosis not present

## 2022-02-20 DIAGNOSIS — Z992 Dependence on renal dialysis: Secondary | ICD-10-CM | POA: Diagnosis not present

## 2022-02-23 DIAGNOSIS — Z992 Dependence on renal dialysis: Secondary | ICD-10-CM | POA: Diagnosis not present

## 2022-02-23 DIAGNOSIS — Z7901 Long term (current) use of anticoagulants: Secondary | ICD-10-CM | POA: Diagnosis not present

## 2022-02-23 DIAGNOSIS — N2581 Secondary hyperparathyroidism of renal origin: Secondary | ICD-10-CM | POA: Diagnosis not present

## 2022-02-23 DIAGNOSIS — Z794 Long term (current) use of insulin: Secondary | ICD-10-CM | POA: Diagnosis not present

## 2022-02-23 DIAGNOSIS — E119 Type 2 diabetes mellitus without complications: Secondary | ICD-10-CM | POA: Diagnosis not present

## 2022-02-23 DIAGNOSIS — N186 End stage renal disease: Secondary | ICD-10-CM | POA: Diagnosis not present

## 2022-02-23 DIAGNOSIS — Z5181 Encounter for therapeutic drug level monitoring: Secondary | ICD-10-CM | POA: Diagnosis not present

## 2022-02-24 ENCOUNTER — Encounter: Payer: Medicare HMO | Admitting: Internal Medicine

## 2022-02-24 DIAGNOSIS — N186 End stage renal disease: Secondary | ICD-10-CM | POA: Diagnosis not present

## 2022-02-24 DIAGNOSIS — Z992 Dependence on renal dialysis: Secondary | ICD-10-CM | POA: Diagnosis not present

## 2022-02-24 DIAGNOSIS — N2581 Secondary hyperparathyroidism of renal origin: Secondary | ICD-10-CM | POA: Diagnosis not present

## 2022-02-25 DIAGNOSIS — N186 End stage renal disease: Secondary | ICD-10-CM | POA: Diagnosis not present

## 2022-02-25 DIAGNOSIS — N2581 Secondary hyperparathyroidism of renal origin: Secondary | ICD-10-CM | POA: Diagnosis not present

## 2022-02-25 DIAGNOSIS — Z992 Dependence on renal dialysis: Secondary | ICD-10-CM | POA: Diagnosis not present

## 2022-02-26 DIAGNOSIS — N186 End stage renal disease: Secondary | ICD-10-CM | POA: Diagnosis not present

## 2022-02-26 DIAGNOSIS — Z992 Dependence on renal dialysis: Secondary | ICD-10-CM | POA: Diagnosis not present

## 2022-02-27 DIAGNOSIS — Z992 Dependence on renal dialysis: Secondary | ICD-10-CM | POA: Diagnosis not present

## 2022-02-27 DIAGNOSIS — N186 End stage renal disease: Secondary | ICD-10-CM | POA: Diagnosis not present

## 2022-02-27 DIAGNOSIS — N2581 Secondary hyperparathyroidism of renal origin: Secondary | ICD-10-CM | POA: Diagnosis not present

## 2022-03-02 DIAGNOSIS — N186 End stage renal disease: Secondary | ICD-10-CM | POA: Diagnosis not present

## 2022-03-02 DIAGNOSIS — N2581 Secondary hyperparathyroidism of renal origin: Secondary | ICD-10-CM | POA: Diagnosis not present

## 2022-03-02 DIAGNOSIS — Z992 Dependence on renal dialysis: Secondary | ICD-10-CM | POA: Diagnosis not present

## 2022-03-04 DIAGNOSIS — Z992 Dependence on renal dialysis: Secondary | ICD-10-CM | POA: Diagnosis not present

## 2022-03-04 DIAGNOSIS — N2581 Secondary hyperparathyroidism of renal origin: Secondary | ICD-10-CM | POA: Diagnosis not present

## 2022-03-04 DIAGNOSIS — N186 End stage renal disease: Secondary | ICD-10-CM | POA: Diagnosis not present

## 2022-03-06 DIAGNOSIS — Z992 Dependence on renal dialysis: Secondary | ICD-10-CM | POA: Diagnosis not present

## 2022-03-06 DIAGNOSIS — N2581 Secondary hyperparathyroidism of renal origin: Secondary | ICD-10-CM | POA: Diagnosis not present

## 2022-03-06 DIAGNOSIS — N186 End stage renal disease: Secondary | ICD-10-CM | POA: Diagnosis not present

## 2022-03-09 DIAGNOSIS — N186 End stage renal disease: Secondary | ICD-10-CM | POA: Diagnosis not present

## 2022-03-09 DIAGNOSIS — N2581 Secondary hyperparathyroidism of renal origin: Secondary | ICD-10-CM | POA: Diagnosis not present

## 2022-03-09 DIAGNOSIS — Z992 Dependence on renal dialysis: Secondary | ICD-10-CM | POA: Diagnosis not present

## 2022-03-11 DIAGNOSIS — Z992 Dependence on renal dialysis: Secondary | ICD-10-CM | POA: Diagnosis not present

## 2022-03-11 DIAGNOSIS — N186 End stage renal disease: Secondary | ICD-10-CM | POA: Diagnosis not present

## 2022-03-11 DIAGNOSIS — N2581 Secondary hyperparathyroidism of renal origin: Secondary | ICD-10-CM | POA: Diagnosis not present

## 2022-03-13 DIAGNOSIS — N2581 Secondary hyperparathyroidism of renal origin: Secondary | ICD-10-CM | POA: Diagnosis not present

## 2022-03-13 DIAGNOSIS — Z992 Dependence on renal dialysis: Secondary | ICD-10-CM | POA: Diagnosis not present

## 2022-03-13 DIAGNOSIS — N186 End stage renal disease: Secondary | ICD-10-CM | POA: Diagnosis not present

## 2022-03-15 DIAGNOSIS — T82898A Other specified complication of vascular prosthetic devices, implants and grafts, initial encounter: Secondary | ICD-10-CM | POA: Diagnosis not present

## 2022-03-15 DIAGNOSIS — I120 Hypertensive chronic kidney disease with stage 5 chronic kidney disease or end stage renal disease: Secondary | ICD-10-CM | POA: Diagnosis not present

## 2022-03-15 DIAGNOSIS — I771 Stricture of artery: Secondary | ICD-10-CM | POA: Diagnosis not present

## 2022-03-15 DIAGNOSIS — E1122 Type 2 diabetes mellitus with diabetic chronic kidney disease: Secondary | ICD-10-CM | POA: Diagnosis not present

## 2022-03-15 DIAGNOSIS — T82858A Stenosis of vascular prosthetic devices, implants and grafts, initial encounter: Secondary | ICD-10-CM | POA: Diagnosis not present

## 2022-03-15 DIAGNOSIS — G4730 Sleep apnea, unspecified: Secondary | ICD-10-CM | POA: Diagnosis not present

## 2022-03-15 DIAGNOSIS — N186 End stage renal disease: Secondary | ICD-10-CM | POA: Diagnosis not present

## 2022-03-15 DIAGNOSIS — Z95828 Presence of other vascular implants and grafts: Secondary | ICD-10-CM | POA: Diagnosis not present

## 2022-03-15 DIAGNOSIS — Z992 Dependence on renal dialysis: Secondary | ICD-10-CM | POA: Diagnosis not present

## 2022-03-16 DIAGNOSIS — N2581 Secondary hyperparathyroidism of renal origin: Secondary | ICD-10-CM | POA: Diagnosis not present

## 2022-03-16 DIAGNOSIS — N186 End stage renal disease: Secondary | ICD-10-CM | POA: Diagnosis not present

## 2022-03-16 DIAGNOSIS — Z992 Dependence on renal dialysis: Secondary | ICD-10-CM | POA: Diagnosis not present

## 2022-03-18 DIAGNOSIS — N2581 Secondary hyperparathyroidism of renal origin: Secondary | ICD-10-CM | POA: Diagnosis not present

## 2022-03-18 DIAGNOSIS — N186 End stage renal disease: Secondary | ICD-10-CM | POA: Diagnosis not present

## 2022-03-18 DIAGNOSIS — Z992 Dependence on renal dialysis: Secondary | ICD-10-CM | POA: Diagnosis not present

## 2022-03-20 DIAGNOSIS — N186 End stage renal disease: Secondary | ICD-10-CM | POA: Diagnosis not present

## 2022-03-20 DIAGNOSIS — N2581 Secondary hyperparathyroidism of renal origin: Secondary | ICD-10-CM | POA: Diagnosis not present

## 2022-03-20 DIAGNOSIS — Z992 Dependence on renal dialysis: Secondary | ICD-10-CM | POA: Diagnosis not present

## 2022-03-23 DIAGNOSIS — Z7901 Long term (current) use of anticoagulants: Secondary | ICD-10-CM | POA: Diagnosis not present

## 2022-03-23 DIAGNOSIS — Z5181 Encounter for therapeutic drug level monitoring: Secondary | ICD-10-CM | POA: Diagnosis not present

## 2022-03-23 DIAGNOSIS — Z992 Dependence on renal dialysis: Secondary | ICD-10-CM | POA: Diagnosis not present

## 2022-03-23 DIAGNOSIS — N2581 Secondary hyperparathyroidism of renal origin: Secondary | ICD-10-CM | POA: Diagnosis not present

## 2022-03-23 DIAGNOSIS — N186 End stage renal disease: Secondary | ICD-10-CM | POA: Diagnosis not present

## 2022-03-25 DIAGNOSIS — N2581 Secondary hyperparathyroidism of renal origin: Secondary | ICD-10-CM | POA: Diagnosis not present

## 2022-03-25 DIAGNOSIS — N186 End stage renal disease: Secondary | ICD-10-CM | POA: Diagnosis not present

## 2022-03-25 DIAGNOSIS — Z992 Dependence on renal dialysis: Secondary | ICD-10-CM | POA: Diagnosis not present

## 2022-03-26 DIAGNOSIS — N186 End stage renal disease: Secondary | ICD-10-CM | POA: Diagnosis not present

## 2022-03-26 DIAGNOSIS — Z992 Dependence on renal dialysis: Secondary | ICD-10-CM | POA: Diagnosis not present

## 2022-03-26 DIAGNOSIS — N2581 Secondary hyperparathyroidism of renal origin: Secondary | ICD-10-CM | POA: Diagnosis not present

## 2022-03-27 DIAGNOSIS — Z992 Dependence on renal dialysis: Secondary | ICD-10-CM | POA: Diagnosis not present

## 2022-03-27 DIAGNOSIS — N186 End stage renal disease: Secondary | ICD-10-CM | POA: Diagnosis not present

## 2022-03-27 DIAGNOSIS — N2581 Secondary hyperparathyroidism of renal origin: Secondary | ICD-10-CM | POA: Diagnosis not present

## 2022-03-29 DIAGNOSIS — N186 End stage renal disease: Secondary | ICD-10-CM | POA: Diagnosis not present

## 2022-03-29 DIAGNOSIS — Z992 Dependence on renal dialysis: Secondary | ICD-10-CM | POA: Diagnosis not present

## 2022-03-30 ENCOUNTER — Other Ambulatory Visit: Payer: Self-pay

## 2022-03-30 DIAGNOSIS — Z992 Dependence on renal dialysis: Secondary | ICD-10-CM | POA: Diagnosis not present

## 2022-03-30 DIAGNOSIS — N186 End stage renal disease: Secondary | ICD-10-CM | POA: Diagnosis not present

## 2022-03-30 DIAGNOSIS — N2581 Secondary hyperparathyroidism of renal origin: Secondary | ICD-10-CM | POA: Diagnosis not present

## 2022-03-30 NOTE — Patient Outreach (Signed)
[facility]) Care Management  03/30/2022  [name] Jan 18, 1971 [id number]   Telephone Screen    Outreach call to patient to introduce [facility] services and assess care needs as part of benefit of PCP office and insurance plan. No answer. RN CM left HIPAA compliant voicemail message along with contact info.      Plan: RN CM will make outreach attempt to patient within 4 business days. RN CM will send unsuccessful outreach letter to patient.   [name], RN,BSN,CCM [place] Management Telephonic Care Management Coordinator Direct Phone: [phone] Toll Free: [phone] Fax: [phone]

## 2022-03-31 DIAGNOSIS — H4312 Vitreous hemorrhage, left eye: Secondary | ICD-10-CM | POA: Diagnosis not present

## 2022-03-31 DIAGNOSIS — E113592 Type 2 diabetes mellitus with proliferative diabetic retinopathy without macular edema, left eye: Secondary | ICD-10-CM | POA: Diagnosis not present

## 2022-04-01 ENCOUNTER — Other Ambulatory Visit: Payer: Self-pay

## 2022-04-01 DIAGNOSIS — N186 End stage renal disease: Secondary | ICD-10-CM | POA: Diagnosis not present

## 2022-04-01 DIAGNOSIS — N2581 Secondary hyperparathyroidism of renal origin: Secondary | ICD-10-CM | POA: Diagnosis not present

## 2022-04-01 DIAGNOSIS — Z992 Dependence on renal dialysis: Secondary | ICD-10-CM | POA: Diagnosis not present

## 2022-04-01 NOTE — Patient Outreach (Signed)
[facility]) Care Management  04/01/2022  [name] April 05, 1971 [id number]   Telephone Screen       Unsuccessful outreach call to patient to introduce [facility] services and assess care needs as part of benefit of PCP office and insurance plan.     Plan: RN CM will make outreach attempt to patient within 4 business days.  [name], RN,BSN,CCM [place] Management Telephonic Care Management Coordinator Direct Phone: [phone] Toll Free: [phone] Fax: [phone]

## 2022-04-03 DIAGNOSIS — Z992 Dependence on renal dialysis: Secondary | ICD-10-CM | POA: Diagnosis not present

## 2022-04-03 DIAGNOSIS — N2581 Secondary hyperparathyroidism of renal origin: Secondary | ICD-10-CM | POA: Diagnosis not present

## 2022-04-03 DIAGNOSIS — N186 End stage renal disease: Secondary | ICD-10-CM | POA: Diagnosis not present

## 2022-04-05 ENCOUNTER — Other Ambulatory Visit: Payer: Self-pay

## 2022-04-05 NOTE — Patient Outreach (Signed)
[facility] [facility]) Care Management  04/05/2022  [name] 1971-05-11 [id number]   Telephone Screen       Unsuccessful outreach call to patient to introduce [name] services and assess care needs as part of benefit of PCP office and insurance plan.       Plan: RN CM will make outreach attempt to patient within 3-4 wks if no response from letter mailed to patient.  [name], RN,BSN,CCM [name] Telephonic Care Management Coordinator Direct Phone: [phone] Toll Free: [phone] Fax: [phone]

## 2022-04-06 DIAGNOSIS — Z992 Dependence on renal dialysis: Secondary | ICD-10-CM | POA: Diagnosis not present

## 2022-04-06 DIAGNOSIS — N186 End stage renal disease: Secondary | ICD-10-CM | POA: Diagnosis not present

## 2022-04-06 DIAGNOSIS — N2581 Secondary hyperparathyroidism of renal origin: Secondary | ICD-10-CM | POA: Diagnosis not present

## 2022-04-08 DIAGNOSIS — N2581 Secondary hyperparathyroidism of renal origin: Secondary | ICD-10-CM | POA: Diagnosis not present

## 2022-04-08 DIAGNOSIS — Z992 Dependence on renal dialysis: Secondary | ICD-10-CM | POA: Diagnosis not present

## 2022-04-08 DIAGNOSIS — N186 End stage renal disease: Secondary | ICD-10-CM | POA: Diagnosis not present

## 2022-04-10 DIAGNOSIS — N186 End stage renal disease: Secondary | ICD-10-CM | POA: Diagnosis not present

## 2022-04-10 DIAGNOSIS — N2581 Secondary hyperparathyroidism of renal origin: Secondary | ICD-10-CM | POA: Diagnosis not present

## 2022-04-10 DIAGNOSIS — Z992 Dependence on renal dialysis: Secondary | ICD-10-CM | POA: Diagnosis not present

## 2022-04-13 DIAGNOSIS — N2581 Secondary hyperparathyroidism of renal origin: Secondary | ICD-10-CM | POA: Diagnosis not present

## 2022-04-13 DIAGNOSIS — Z992 Dependence on renal dialysis: Secondary | ICD-10-CM | POA: Diagnosis not present

## 2022-04-13 DIAGNOSIS — N186 End stage renal disease: Secondary | ICD-10-CM | POA: Diagnosis not present

## 2022-04-15 DIAGNOSIS — N2581 Secondary hyperparathyroidism of renal origin: Secondary | ICD-10-CM | POA: Diagnosis not present

## 2022-04-15 DIAGNOSIS — N186 End stage renal disease: Secondary | ICD-10-CM | POA: Diagnosis not present

## 2022-04-15 DIAGNOSIS — Z992 Dependence on renal dialysis: Secondary | ICD-10-CM | POA: Diagnosis not present

## 2022-04-17 DIAGNOSIS — N2581 Secondary hyperparathyroidism of renal origin: Secondary | ICD-10-CM | POA: Diagnosis not present

## 2022-04-17 DIAGNOSIS — Z992 Dependence on renal dialysis: Secondary | ICD-10-CM | POA: Diagnosis not present

## 2022-04-17 DIAGNOSIS — N186 End stage renal disease: Secondary | ICD-10-CM | POA: Diagnosis not present

## 2022-04-20 DIAGNOSIS — Z992 Dependence on renal dialysis: Secondary | ICD-10-CM | POA: Diagnosis not present

## 2022-04-20 DIAGNOSIS — N2581 Secondary hyperparathyroidism of renal origin: Secondary | ICD-10-CM | POA: Diagnosis not present

## 2022-04-20 DIAGNOSIS — N186 End stage renal disease: Secondary | ICD-10-CM | POA: Diagnosis not present

## 2022-04-21 DIAGNOSIS — E113592 Type 2 diabetes mellitus with proliferative diabetic retinopathy without macular edema, left eye: Secondary | ICD-10-CM | POA: Diagnosis not present

## 2022-04-22 DIAGNOSIS — N186 End stage renal disease: Secondary | ICD-10-CM | POA: Diagnosis not present

## 2022-04-22 DIAGNOSIS — N2581 Secondary hyperparathyroidism of renal origin: Secondary | ICD-10-CM | POA: Diagnosis not present

## 2022-04-22 DIAGNOSIS — Z992 Dependence on renal dialysis: Secondary | ICD-10-CM | POA: Diagnosis not present

## 2022-04-24 DIAGNOSIS — Z992 Dependence on renal dialysis: Secondary | ICD-10-CM | POA: Diagnosis not present

## 2022-04-24 DIAGNOSIS — N2581 Secondary hyperparathyroidism of renal origin: Secondary | ICD-10-CM | POA: Diagnosis not present

## 2022-04-24 DIAGNOSIS — N186 End stage renal disease: Secondary | ICD-10-CM | POA: Diagnosis not present

## 2022-04-25 DIAGNOSIS — N186 End stage renal disease: Secondary | ICD-10-CM | POA: Diagnosis not present

## 2022-04-25 DIAGNOSIS — Z992 Dependence on renal dialysis: Secondary | ICD-10-CM | POA: Diagnosis not present

## 2022-04-25 DIAGNOSIS — N2581 Secondary hyperparathyroidism of renal origin: Secondary | ICD-10-CM | POA: Diagnosis not present

## 2022-04-26 ENCOUNTER — Other Ambulatory Visit: Payer: Self-pay

## 2022-04-26 NOTE — Patient Outreach (Signed)
[facility]) Care Management  04/26/2022  Narada A Conigliaro [birth year] [id number]   Telephone Screen    Multiple unsuccessful attempts to outreach patient to introduce [facility] services and assess care needs as part of benefit of PCP office and insurance plan. RN CM will close case.     [name], RN,BSN,CCM [place] Management Telephonic Care Management Coordinator Direct Phone: [phone] Toll Free: [phone] Fax: [phone]

## 2022-04-27 DIAGNOSIS — N186 End stage renal disease: Secondary | ICD-10-CM | POA: Diagnosis not present

## 2022-04-27 DIAGNOSIS — Z5181 Encounter for therapeutic drug level monitoring: Secondary | ICD-10-CM | POA: Diagnosis not present

## 2022-04-27 DIAGNOSIS — N2581 Secondary hyperparathyroidism of renal origin: Secondary | ICD-10-CM | POA: Diagnosis not present

## 2022-04-27 DIAGNOSIS — Z7901 Long term (current) use of anticoagulants: Secondary | ICD-10-CM | POA: Diagnosis not present

## 2022-04-27 DIAGNOSIS — Z992 Dependence on renal dialysis: Secondary | ICD-10-CM | POA: Diagnosis not present

## 2022-04-29 DIAGNOSIS — N186 End stage renal disease: Secondary | ICD-10-CM | POA: Diagnosis not present

## 2022-04-29 DIAGNOSIS — N2581 Secondary hyperparathyroidism of renal origin: Secondary | ICD-10-CM | POA: Diagnosis not present

## 2022-04-29 DIAGNOSIS — Z992 Dependence on renal dialysis: Secondary | ICD-10-CM | POA: Diagnosis not present

## 2022-04-30 DIAGNOSIS — I120 Hypertensive chronic kidney disease with stage 5 chronic kidney disease or end stage renal disease: Secondary | ICD-10-CM | POA: Diagnosis not present

## 2022-04-30 DIAGNOSIS — E1151 Type 2 diabetes mellitus with diabetic peripheral angiopathy without gangrene: Secondary | ICD-10-CM | POA: Diagnosis not present

## 2022-04-30 DIAGNOSIS — N186 End stage renal disease: Secondary | ICD-10-CM | POA: Diagnosis not present

## 2022-04-30 DIAGNOSIS — E785 Hyperlipidemia, unspecified: Secondary | ICD-10-CM | POA: Diagnosis not present

## 2022-04-30 DIAGNOSIS — Z794 Long term (current) use of insulin: Secondary | ICD-10-CM | POA: Diagnosis not present

## 2022-04-30 DIAGNOSIS — E113593 Type 2 diabetes mellitus with proliferative diabetic retinopathy without macular edema, bilateral: Secondary | ICD-10-CM | POA: Diagnosis not present

## 2022-04-30 DIAGNOSIS — E221 Hyperprolactinemia: Secondary | ICD-10-CM | POA: Diagnosis not present

## 2022-04-30 DIAGNOSIS — E1122 Type 2 diabetes mellitus with diabetic chronic kidney disease: Secondary | ICD-10-CM | POA: Diagnosis not present

## 2022-04-30 DIAGNOSIS — N2581 Secondary hyperparathyroidism of renal origin: Secondary | ICD-10-CM | POA: Diagnosis not present

## 2022-04-30 DIAGNOSIS — Z992 Dependence on renal dialysis: Secondary | ICD-10-CM | POA: Diagnosis not present

## 2022-05-01 DIAGNOSIS — N186 End stage renal disease: Secondary | ICD-10-CM | POA: Diagnosis not present

## 2022-05-01 DIAGNOSIS — Z992 Dependence on renal dialysis: Secondary | ICD-10-CM | POA: Diagnosis not present

## 2022-05-01 DIAGNOSIS — N2581 Secondary hyperparathyroidism of renal origin: Secondary | ICD-10-CM | POA: Diagnosis not present

## 2022-05-04 DIAGNOSIS — N186 End stage renal disease: Secondary | ICD-10-CM | POA: Diagnosis not present

## 2022-05-04 DIAGNOSIS — N2581 Secondary hyperparathyroidism of renal origin: Secondary | ICD-10-CM | POA: Diagnosis not present

## 2022-05-04 DIAGNOSIS — Z992 Dependence on renal dialysis: Secondary | ICD-10-CM | POA: Diagnosis not present

## 2022-05-06 DIAGNOSIS — Z992 Dependence on renal dialysis: Secondary | ICD-10-CM | POA: Diagnosis not present

## 2022-05-06 DIAGNOSIS — N2581 Secondary hyperparathyroidism of renal origin: Secondary | ICD-10-CM | POA: Diagnosis not present

## 2022-05-06 DIAGNOSIS — N186 End stage renal disease: Secondary | ICD-10-CM | POA: Diagnosis not present

## 2022-05-08 DIAGNOSIS — N186 End stage renal disease: Secondary | ICD-10-CM | POA: Diagnosis not present

## 2022-05-08 DIAGNOSIS — Z992 Dependence on renal dialysis: Secondary | ICD-10-CM | POA: Diagnosis not present

## 2022-05-08 DIAGNOSIS — N2581 Secondary hyperparathyroidism of renal origin: Secondary | ICD-10-CM | POA: Diagnosis not present

## 2022-05-11 DIAGNOSIS — N186 End stage renal disease: Secondary | ICD-10-CM | POA: Diagnosis not present

## 2022-05-11 DIAGNOSIS — N2581 Secondary hyperparathyroidism of renal origin: Secondary | ICD-10-CM | POA: Diagnosis not present

## 2022-05-11 DIAGNOSIS — Z992 Dependence on renal dialysis: Secondary | ICD-10-CM | POA: Diagnosis not present

## 2022-05-13 DIAGNOSIS — N2581 Secondary hyperparathyroidism of renal origin: Secondary | ICD-10-CM | POA: Diagnosis not present

## 2022-05-13 DIAGNOSIS — Z992 Dependence on renal dialysis: Secondary | ICD-10-CM | POA: Diagnosis not present

## 2022-05-13 DIAGNOSIS — N186 End stage renal disease: Secondary | ICD-10-CM | POA: Diagnosis not present

## 2022-05-15 DIAGNOSIS — N186 End stage renal disease: Secondary | ICD-10-CM | POA: Diagnosis not present

## 2022-05-15 DIAGNOSIS — Z992 Dependence on renal dialysis: Secondary | ICD-10-CM | POA: Diagnosis not present

## 2022-05-15 DIAGNOSIS — N2581 Secondary hyperparathyroidism of renal origin: Secondary | ICD-10-CM | POA: Diagnosis not present

## 2022-05-18 DIAGNOSIS — N186 End stage renal disease: Secondary | ICD-10-CM | POA: Diagnosis not present

## 2022-05-18 DIAGNOSIS — Z992 Dependence on renal dialysis: Secondary | ICD-10-CM | POA: Diagnosis not present

## 2022-05-18 DIAGNOSIS — N2581 Secondary hyperparathyroidism of renal origin: Secondary | ICD-10-CM | POA: Diagnosis not present

## 2022-05-20 DIAGNOSIS — N186 End stage renal disease: Secondary | ICD-10-CM | POA: Diagnosis not present

## 2022-05-20 DIAGNOSIS — Z992 Dependence on renal dialysis: Secondary | ICD-10-CM | POA: Diagnosis not present

## 2022-05-20 DIAGNOSIS — N2581 Secondary hyperparathyroidism of renal origin: Secondary | ICD-10-CM | POA: Diagnosis not present

## 2022-05-22 DIAGNOSIS — N2581 Secondary hyperparathyroidism of renal origin: Secondary | ICD-10-CM | POA: Diagnosis not present

## 2022-05-22 DIAGNOSIS — N186 End stage renal disease: Secondary | ICD-10-CM | POA: Diagnosis not present

## 2022-05-22 DIAGNOSIS — Z992 Dependence on renal dialysis: Secondary | ICD-10-CM | POA: Diagnosis not present

## 2022-05-25 DIAGNOSIS — N186 End stage renal disease: Secondary | ICD-10-CM | POA: Diagnosis not present

## 2022-05-25 DIAGNOSIS — Z79899 Other long term (current) drug therapy: Secondary | ICD-10-CM | POA: Diagnosis not present

## 2022-05-25 DIAGNOSIS — Z992 Dependence on renal dialysis: Secondary | ICD-10-CM | POA: Diagnosis not present

## 2022-05-25 DIAGNOSIS — N2581 Secondary hyperparathyroidism of renal origin: Secondary | ICD-10-CM | POA: Diagnosis not present

## 2022-05-26 DIAGNOSIS — E113592 Type 2 diabetes mellitus with proliferative diabetic retinopathy without macular edema, left eye: Secondary | ICD-10-CM | POA: Diagnosis not present

## 2022-05-27 ENCOUNTER — Other Ambulatory Visit: Payer: Self-pay | Admitting: Urology

## 2022-05-27 DIAGNOSIS — N186 End stage renal disease: Secondary | ICD-10-CM | POA: Diagnosis not present

## 2022-05-27 DIAGNOSIS — N529 Male erectile dysfunction, unspecified: Secondary | ICD-10-CM

## 2022-05-27 DIAGNOSIS — Z992 Dependence on renal dialysis: Secondary | ICD-10-CM | POA: Diagnosis not present

## 2022-05-27 DIAGNOSIS — N2581 Secondary hyperparathyroidism of renal origin: Secondary | ICD-10-CM | POA: Diagnosis not present

## 2022-05-29 DIAGNOSIS — Z992 Dependence on renal dialysis: Secondary | ICD-10-CM | POA: Diagnosis not present

## 2022-05-29 DIAGNOSIS — N186 End stage renal disease: Secondary | ICD-10-CM | POA: Diagnosis not present

## 2022-05-29 DIAGNOSIS — N2581 Secondary hyperparathyroidism of renal origin: Secondary | ICD-10-CM | POA: Diagnosis not present

## 2022-05-30 DIAGNOSIS — N186 End stage renal disease: Secondary | ICD-10-CM | POA: Diagnosis not present

## 2022-05-30 DIAGNOSIS — N2581 Secondary hyperparathyroidism of renal origin: Secondary | ICD-10-CM | POA: Diagnosis not present

## 2022-05-30 DIAGNOSIS — Z992 Dependence on renal dialysis: Secondary | ICD-10-CM | POA: Diagnosis not present

## 2022-05-31 NOTE — Telephone Encounter (Signed)
Patient called the office today to inquire about the status of this refill request for Viagra.   Please advise. Patient can be reached at [phone]

## 2022-06-01 DIAGNOSIS — N2581 Secondary hyperparathyroidism of renal origin: Secondary | ICD-10-CM | POA: Diagnosis not present

## 2022-06-01 DIAGNOSIS — N186 End stage renal disease: Secondary | ICD-10-CM | POA: Diagnosis not present

## 2022-06-01 DIAGNOSIS — Z992 Dependence on renal dialysis: Secondary | ICD-10-CM | POA: Diagnosis not present

## 2022-06-03 ENCOUNTER — Other Ambulatory Visit: Payer: Self-pay

## 2022-06-03 DIAGNOSIS — Z7901 Long term (current) use of anticoagulants: Secondary | ICD-10-CM | POA: Diagnosis not present

## 2022-06-03 DIAGNOSIS — Z5181 Encounter for therapeutic drug level monitoring: Secondary | ICD-10-CM | POA: Diagnosis not present

## 2022-06-03 DIAGNOSIS — Z992 Dependence on renal dialysis: Secondary | ICD-10-CM | POA: Diagnosis not present

## 2022-06-03 DIAGNOSIS — N186 End stage renal disease: Secondary | ICD-10-CM | POA: Diagnosis not present

## 2022-06-03 DIAGNOSIS — N2581 Secondary hyperparathyroidism of renal origin: Secondary | ICD-10-CM | POA: Diagnosis not present

## 2022-06-05 DIAGNOSIS — N2581 Secondary hyperparathyroidism of renal origin: Secondary | ICD-10-CM | POA: Diagnosis not present

## 2022-06-05 DIAGNOSIS — Z992 Dependence on renal dialysis: Secondary | ICD-10-CM | POA: Diagnosis not present

## 2022-06-05 DIAGNOSIS — N186 End stage renal disease: Secondary | ICD-10-CM | POA: Diagnosis not present

## 2022-06-08 DIAGNOSIS — N2581 Secondary hyperparathyroidism of renal origin: Secondary | ICD-10-CM | POA: Diagnosis not present

## 2022-06-08 DIAGNOSIS — N186 End stage renal disease: Secondary | ICD-10-CM | POA: Diagnosis not present

## 2022-06-08 DIAGNOSIS — Z992 Dependence on renal dialysis: Secondary | ICD-10-CM | POA: Diagnosis not present

## 2022-06-10 DIAGNOSIS — N2581 Secondary hyperparathyroidism of renal origin: Secondary | ICD-10-CM | POA: Diagnosis not present

## 2022-06-10 DIAGNOSIS — Z992 Dependence on renal dialysis: Secondary | ICD-10-CM | POA: Diagnosis not present

## 2022-06-10 DIAGNOSIS — N186 End stage renal disease: Secondary | ICD-10-CM | POA: Diagnosis not present

## 2022-06-11 DIAGNOSIS — T82898A Other specified complication of vascular prosthetic devices, implants and grafts, initial encounter: Secondary | ICD-10-CM | POA: Diagnosis not present

## 2022-06-11 DIAGNOSIS — T82858A Stenosis of vascular prosthetic devices, implants and grafts, initial encounter: Secondary | ICD-10-CM | POA: Diagnosis not present

## 2022-06-11 DIAGNOSIS — N186 End stage renal disease: Secondary | ICD-10-CM | POA: Diagnosis not present

## 2022-06-11 DIAGNOSIS — Z95828 Presence of other vascular implants and grafts: Secondary | ICD-10-CM | POA: Diagnosis not present

## 2022-06-11 DIAGNOSIS — I871 Compression of vein: Secondary | ICD-10-CM | POA: Diagnosis not present

## 2022-06-11 DIAGNOSIS — E1122 Type 2 diabetes mellitus with diabetic chronic kidney disease: Secondary | ICD-10-CM | POA: Diagnosis not present

## 2022-06-11 DIAGNOSIS — Z992 Dependence on renal dialysis: Secondary | ICD-10-CM | POA: Diagnosis not present

## 2022-06-11 DIAGNOSIS — T82838A Hemorrhage of vascular prosthetic devices, implants and grafts, initial encounter: Secondary | ICD-10-CM | POA: Diagnosis not present

## 2022-06-11 DIAGNOSIS — I120 Hypertensive chronic kidney disease with stage 5 chronic kidney disease or end stage renal disease: Secondary | ICD-10-CM | POA: Diagnosis not present

## 2022-06-12 DIAGNOSIS — Z992 Dependence on renal dialysis: Secondary | ICD-10-CM | POA: Diagnosis not present

## 2022-06-12 DIAGNOSIS — N186 End stage renal disease: Secondary | ICD-10-CM | POA: Diagnosis not present

## 2022-06-12 DIAGNOSIS — N2581 Secondary hyperparathyroidism of renal origin: Secondary | ICD-10-CM | POA: Diagnosis not present

## 2022-06-15 DIAGNOSIS — N186 End stage renal disease: Secondary | ICD-10-CM | POA: Diagnosis not present

## 2022-06-15 DIAGNOSIS — Z992 Dependence on renal dialysis: Secondary | ICD-10-CM | POA: Diagnosis not present

## 2022-06-15 DIAGNOSIS — N2581 Secondary hyperparathyroidism of renal origin: Secondary | ICD-10-CM | POA: Diagnosis not present

## 2022-06-17 DIAGNOSIS — N186 End stage renal disease: Secondary | ICD-10-CM | POA: Diagnosis not present

## 2022-06-17 DIAGNOSIS — Z992 Dependence on renal dialysis: Secondary | ICD-10-CM | POA: Diagnosis not present

## 2022-06-17 DIAGNOSIS — N2581 Secondary hyperparathyroidism of renal origin: Secondary | ICD-10-CM | POA: Diagnosis not present

## 2022-06-19 DIAGNOSIS — Z992 Dependence on renal dialysis: Secondary | ICD-10-CM | POA: Diagnosis not present

## 2022-06-19 DIAGNOSIS — N2581 Secondary hyperparathyroidism of renal origin: Secondary | ICD-10-CM | POA: Diagnosis not present

## 2022-06-19 DIAGNOSIS — N186 End stage renal disease: Secondary | ICD-10-CM | POA: Diagnosis not present

## 2022-06-22 DIAGNOSIS — Z794 Long term (current) use of insulin: Secondary | ICD-10-CM | POA: Diagnosis not present

## 2022-06-22 DIAGNOSIS — Z79899 Other long term (current) drug therapy: Secondary | ICD-10-CM | POA: Diagnosis not present

## 2022-06-22 DIAGNOSIS — Z992 Dependence on renal dialysis: Secondary | ICD-10-CM | POA: Diagnosis not present

## 2022-06-22 DIAGNOSIS — Z5181 Encounter for therapeutic drug level monitoring: Secondary | ICD-10-CM | POA: Diagnosis not present

## 2022-06-22 DIAGNOSIS — E119 Type 2 diabetes mellitus without complications: Secondary | ICD-10-CM | POA: Diagnosis not present

## 2022-06-22 DIAGNOSIS — Z7901 Long term (current) use of anticoagulants: Secondary | ICD-10-CM | POA: Diagnosis not present

## 2022-06-22 DIAGNOSIS — N2581 Secondary hyperparathyroidism of renal origin: Secondary | ICD-10-CM | POA: Diagnosis not present

## 2022-06-22 DIAGNOSIS — N186 End stage renal disease: Secondary | ICD-10-CM | POA: Diagnosis not present

## 2022-06-23 DIAGNOSIS — Z992 Dependence on renal dialysis: Secondary | ICD-10-CM | POA: Diagnosis not present

## 2022-06-23 DIAGNOSIS — T82898A Other specified complication of vascular prosthetic devices, implants and grafts, initial encounter: Secondary | ICD-10-CM | POA: Diagnosis not present

## 2022-06-23 DIAGNOSIS — N186 End stage renal disease: Secondary | ICD-10-CM | POA: Diagnosis not present

## 2022-06-23 DIAGNOSIS — T82856A Stenosis of peripheral vascular stent, initial encounter: Secondary | ICD-10-CM | POA: Diagnosis not present

## 2022-06-23 DIAGNOSIS — I120 Hypertensive chronic kidney disease with stage 5 chronic kidney disease or end stage renal disease: Secondary | ICD-10-CM | POA: Diagnosis not present

## 2022-06-23 DIAGNOSIS — T82858A Stenosis of vascular prosthetic devices, implants and grafts, initial encounter: Secondary | ICD-10-CM | POA: Diagnosis not present

## 2022-06-23 DIAGNOSIS — I871 Compression of vein: Secondary | ICD-10-CM | POA: Diagnosis not present

## 2022-06-23 DIAGNOSIS — I771 Stricture of artery: Secondary | ICD-10-CM | POA: Diagnosis not present

## 2022-06-23 DIAGNOSIS — E1122 Type 2 diabetes mellitus with diabetic chronic kidney disease: Secondary | ICD-10-CM | POA: Diagnosis not present

## 2022-06-23 NOTE — Progress Notes (Signed)
MRN : [id number]  [name] is a 51 y.o. ([birth year]) male who presents with chief complaint of check access.  History of Present Illness:   The patient returns to the office for followup of their dialysis access.  He reports recently the function of the access has been stable but he has been sent to an outpatient facility twice since his last study.  The patient denies increased bleeding time or increased recirculation. Patient denies difficulty with cannulation. The patient denies hand pain or other symptoms consistent with steal phenomena.  No significant arm swelling.   The patient denies redness or swelling at the access site. The patient denies fever or chills at home or while on dialysis.   He is also followed for lymphedema.  He notes his swelling has been very well controlled using a brand of compression socks that he has found called Hosox.  He has also been using his lymph pump on a regular basis.  There have been no interval episodes of infection or cellulitis.  He did sustain a trauma recently but the would is healing.   The patient denies recent episodes of angina or shortness of breath.    Duplex ultrasound of the AV access shows a patent access with a moderate venous stenosis.  His volume access is 990 cc/min.  His previous volume access is 1025 cc/minThe previously noted stenosis is improved compared to last study.    No outpatient medications have been marked as taking for the 06/28/22 encounter (Appointment) with Delana Meyer, Dolores Lory, MD.   Current Facility-Administered Medications for the 06/28/22 encounter (Appointment) with Delana Meyer, Dolores Lory, MD  Medication   0.9 %  sodium chloride infusion    Past Medical History:  Diagnosis Date   Anemia    Chronic kidney disease    14% FUNCTION   Diabetes mellitus without complication ([place])    Diabetic retinopathy ([place])    Dyspnea    DOE   Dysrhythmia    End stage renal disease ([place])     History of orthopnea    error wrong patietn   Hypercholesteremia    error   Hyperkalemia    error wrong patient   Hypertension    Long-term insulin use ([place])    Lymphedema of leg    Metabolic encephalopathy    error   Microalbuminuria    MSSA (methicillin susceptible Staphylococcus aureus)    Obesity    PVD (peripheral vascular disease) ([place])    Respiratory failure (H[facility])    Error   Sepsis ([place])    error   Sleep apnea    no CPAP   UTI (urinary tract infection)    error   Venous ulcer ([place])    Vitamin D deficiency     Past Surgical History:  Procedure Laterality Date   A/V FISTULAGRAM Right 10/24/2018   Procedure: A/V FISTULAGRAM;  Surgeon: Katha Cabal, MD;  Location: [place] [facility];  Service: Cardiovascular;  Laterality: Right;   A/V FISTULAGRAM Right 11/24/2018   Procedure: A/V FISTULAGRAM;  Surgeon: Katha Cabal, MD;  Location: [place] [facility];  Service: Cardiovascular;  Laterality: Right;   A/V FISTULAGRAM Left 07/31/2019   Procedure: A/V FISTULAGRAM;  Surgeon: Katha Cabal, MD;  Location: [place] [facility];  Service: Cardiovascular;  Laterality: Left;   A/V FISTULAGRAM Left 12/11/2019   Procedure: A/V  FISTULAGRAM;  Surgeon: Katha Cabal, MD;  Location: [place] [facility];  Service: Cardiovascular;  Laterality: Left;   A/V SHUNT INTERVENTION Right 02/21/2019   Procedure: A/V SHUNT INTERVENTION;  Surgeon: Katha Cabal, MD;  Location: [place] [facility];  Service: Cardiovascular;  Laterality: Right;   APPLICATION OF WOUND VAC Left 10/03/2017   Procedure: APPLICATION OF WOUND VAC;  Surgeon: Algernon Huxley, MD;  Location: [facility];  Service: General;  Laterality: Left;   APPLICATION OF WOUND VAC Left 10/11/2017   Procedure: APPLICATION OF WOUND VAC;  Surgeon: Katha Cabal, MD;  Location: [facility];  Service: Vascular;  Laterality: Left;   APPLICATION OF WOUND VAC Left 10/14/2017   Procedure: WOUND VAC CHANGE;  Surgeon: Katha Cabal, MD;  Location: [facility];  Service: Vascular;  Laterality: Left;  left lower leg   APPLICATION OF WOUND VAC Left 10/18/2017   Procedure: WOUND VAC CHANGE;  Surgeon: Katha Cabal, MD;  Location: [facility];  Service: Vascular;  Laterality: Left;   APPLICATION OF WOUND VAC Left 10/07/2017   Procedure: APPLICATION OF WOUND VAC;  Surgeon: Katha Cabal, MD;  Location: [facility];  Service: Vascular;  Laterality: Left;   AV FISTULA INSERTION W/ RF MAGNETIC GUIDANCE Right 07/19/2018   Procedure: AV FISTULA INSERTION W/RF MAGNETIC GUIDANCE;  Surgeon: Katha Cabal, MD;  Location: [place] [facility];  Service: Cardiovascular;  Laterality: Right;   AV FISTULA PLACEMENT Left 05/11/2019   Procedure: ARTERIOVENOUS (AV) FISTULA CREATION (RADIOCEPHALIC );  Surgeon: Katha Cabal, MD;  Location: [facility];  Service: Vascular;  Laterality: Left;   AV FISTULA PLACEMENT Left 02/20/2020   Procedure: INSERTION OF ARTERIOVENOUS (AV) GORE-TEX GRAFT LEFT ARM ( FOREARM LOOP );  Surgeon: Katha Cabal, MD;  Location: [facility];  Service: Vascular;  Laterality: Left;   CATARACT EXTRACTION W/PHACO Left 11/01/2018   Procedure: CATARACT EXTRACTION PHACO AND INTRAOCULAR LENS PLACEMENT (IOC)-LEFT, DIABETIC-INSULIN DEPENDENT;  Surgeon: Eulogio Bear, MD;  Location: [facility];  Service: Ophthalmology;  Laterality: Left;  [place] 00:41.8 CDE 4.61 Fluid Pack Lot # [id number]   CATARACT EXTRACTION W/PHACO Right 04/27/2019   Procedure: CATARACT EXTRACTION PHACO AND INTRAOCULAR LENS PLACEMENT (IOC);  Surgeon: Eulogio Bear, MD;  Location: [facility];  Service: Ophthalmology;  Laterality: Right;  [place] 00:34 CDE 1.97 FLUID PACK LOT # [id number] H    COLONOSCOPY WITH PROPOFOL N/A 05/12/2020   Procedure: COLONOSCOPY WITH PROPOFOL;  Surgeon: [place], Benay Pike, MD;  Location: [facility];  Service: Gastroenterology;  Laterality: N/A;   DIALYSIS/PERMA CATHETER INSERTION N/A 02/23/2019   Procedure: DIALYSIS/PERMA  CATHETER INSERTION;  Surgeon: Algernon Huxley, MD;  Location: [place] [facility];  Service: Cardiovascular;  Laterality: N/A;   DIALYSIS/PERMA CATHETER REMOVAL N/A 04/03/2020   Procedure: DIALYSIS/PERMA CATHETER REMOVAL;  Surgeon: Katha Cabal, MD;  Location: [place] [facility];  Service: Cardiovascular;  Laterality: N/A;   DIALYSIS/PERMA CATHETER REMOVAL N/A 02/03/2022   Procedure: DIALYSIS/PERMA CATHETER REMOVAL;  Surgeon: Katha Cabal, MD;  Location: [place] [facility];  Service: Cardiovascular;  Laterality: N/A;   I & D EXTREMITY Left 10/11/2017   Procedure: IRRIGATION AND DEBRIDEMENT EXTREMITY;  Surgeon: Katha Cabal, MD;  Location: [facility];  Service: Vascular;  Laterality: Left;   INCISION AND DRAINAGE ABSCESS Right 10/07/2017   Procedure: INCISION AND DRAINAGE ABSCESS;  Surgeon: Katha Cabal, MD;  Location: [facility];  Service: Vascular;  Laterality: Right;   IRRIGATION AND DEBRIDEMENT ABSCESS Left 10/03/2017  Procedure: IRRIGATION AND DEBRIDEMENT ABSCESS with debridement of skin, soft tissue, muscle 50sq cm;  Surgeon: Algernon Huxley, MD;  Location: [facility];  Service: General;  Laterality: Left;   TEMPORARY DIALYSIS CATHETER  02/20/2019   Procedure: TEMPORARY DIALYSIS CATHETER;  Surgeon: Katha Cabal, MD;  Location: [place] [facility];  Service: Cardiovascular;;   UPPER EXTREMITY ANGIOGRAPHY Left 09/18/2019   Procedure: UPPER EXTREMITY ANGIOGRAPHY;  Surgeon: Katha Cabal, MD;  Location: [place] [facility];  Service: Cardiovascular;  Laterality: Left;    Social History Social History   Tobacco Use   Smoking status: Former    Types: Cigars    Passive exposure: Past   Smokeless tobacco: Former    Quit date: 09/30/2017   Tobacco comments:    occasional  Vaping Use   Vaping Use: Never used  Substance Use Topics   Alcohol use: Yes    Alcohol/week: 1.0 standard drink of alcohol    Types: 1 Cans of beer per week    Comment: beer   Drug use: Not  Currently    Family History Family History  Problem Relation Age of Onset   Diabetes Father    Kidney disease Father    Diabetes Daughter     Allergies  Allergen Reactions   Lisinopril Swelling    Lips mouth    Furosemide Cough    Light headed, blurry vision, weakness.   Latex    Adhesive [Tape] Itching and Rash     REVIEW OF SYSTEMS (Negative unless checked)  Constitutional: '[]'$ Weight loss  '[]'$ Fever  '[]'$ Chills Cardiac: '[]'$ Chest pain   '[]'$ Chest pressure   '[]'$ Palpitations   '[]'$ Shortness of breath when laying flat   '[]'$ Shortness of breath with exertion. Vascular:  '[]'$ Pain in legs with walking   '[x]'$ Pain in legs at rest  '[]'$ History of DVT   '[]'$ Phlebitis   '[x]'$ Swelling in legs   '[]'$ Varicose veins   '[]'$ Non-healing ulcers Pulmonary:   '[]'$ Uses home oxygen   '[]'$ Productive cough   '[]'$ Hemoptysis   '[]'$ Wheeze  '[]'$ COPD   '[]'$ Asthma Neurologic:  '[]'$ Dizziness   '[]'$ Seizures   '[]'$ History of stroke   '[]'$ History of TIA  '[]'$ Aphasia   '[]'$ Vissual changes   '[]'$ Weakness or numbness in arm   '[]'$ Weakness or numbness in leg Musculoskeletal:   '[]'$ Joint swelling   '[]'$ Joint pain   '[]'$ Low back pain Hematologic:  '[]'$ Easy bruising  '[]'$ Easy bleeding   '[]'$ Hypercoagulable state   '[]'$ Anemic Gastrointestinal:  '[]'$ Diarrhea   '[]'$ Vomiting  '[]'$ Gastroesophageal reflux/heartburn   '[]'$ Difficulty swallowing. Genitourinary:  '[x]'$ Chronic kidney disease   '[]'$ Difficult urination  '[]'$ Frequent urination   '[]'$ Blood in urine Skin:  '[]'$ Rashes   '[]'$ Ulcers  Psychological:  '[]'$ History of anxiety   '[]'$  History of major depression.  Physical Examination  There were no vitals filed for this visit. There is no height or weight on file to calculate BMI. Gen: WD/WN, NAD Head: [place]/AT, No temporalis wasting.  Ear/Nose/Throat: Hearing grossly intact, nares w/o erythema or drainage Eyes: PER, EOMI, sclera nonicteric.  Neck: Supple, no gross masses or lesions.  No JVD.  Pulmonary:  Good air movement, no audible wheezing, no use of accessory muscles.  Cardiac: RRR, precordium  non-hyperdynamic. Vascular:   left forearm loop graft good thrill good bruit Vessel Right Left  Radial Palpable Palpable  Brachial Palpable Palpable  Gastrointestinal: soft, non-distended. No guarding/no peritoneal signs.  Musculoskeletal: M/S 5/5 throughout.  No deformity.  Neurologic: CN 2-12 intact. Pain and light touch intact in extremities.  Symmetrical.  Speech is fluent. Motor exam as  listed above. Psychiatric: Judgment intact, Mood & affect appropriate for pt's clinical situation. Dermatologic: No rashes or ulcers noted.  No changes consistent with cellulitis.   CBC Lab Results  Component Value Date   WBC 6.0 02/19/2020   HGB 13.1 09/29/2020   HCT 41.8 09/29/2020   MCV 95.0 02/19/2020   PLT 190 02/19/2020    BMET    Component Value Date/Time   NA 140 02/20/2020 0650   K 5.1 05/12/2020 0842   CL 98 02/20/2020 0650   CO2 28 02/19/2020 1316   GLUCOSE 209 (H) 02/20/2020 0650   BUN 37 (H) 02/20/2020 0650   CREATININE 12.00 (H) 02/20/2020 0650   CALCIUM 8.5 (L) 02/19/2020 1316   GFRNONAA 8 (L) 02/19/2020 1316   GFRAA 9 (L) 02/19/2020 1316   CrCl cannot be calculated (Patient's most recent lab result is older than the maximum 21 days allowed.).  COAG Lab Results  Component Value Date   INR 1.4 (H) 03/21/2020   INR 1.2 02/20/2020   INR 1.4 (H) 02/19/2020    Radiology No results found.   Assessment/Plan 1. End stage renal disease ([place]) Recommend:  The patient is doing well and currently has adequate dialysis access. The patient's dialysis center is not reporting any access issues. Flow pattern is stable when compared to the prior ultrasound.  The patient should have a duplex ultrasound of the dialysis access in 6 months. The patient will follow-up with me in the office after each ultrasound    - VAS [place] (AVF, AVG); Future  2. Lymphedema Recommend:  No surgery or intervention at this point in time.  His recent wound appears clean  and to be healing.  I have reviewed my discussion with the patient regarding lymphedema and why it  causes symptoms.  Patient will continue wearing graduated compression on a daily basis. The patient should put the compression on first thing in the morning and removing them in the evening. The patient should not sleep in the compression.   In addition, behavioral modification throughout the day will be continued.  This will include frequent elevation (such as in a recliner), use of over the counter pain medications as needed and exercise such as walking.  The systemic causes for chronic edema such as liver, kidney and cardiac etiologies does not appear to have significant changed over the past year.    The patient will continue aggressive use of the  lymph pump.  This will continue to improve the edema control and prevent sequela such as ulcers and infections.   The patient will follow-up with me on an annual basis.    3. Chronic venous insufficiency Recommend:  No surgery or intervention at this point in time.  His recent wound appears clean and to be healing.  I have reviewed my discussion with the patient regarding lymphedema and why it  causes symptoms.  Patient will continue wearing graduated compression on a daily basis. The patient should put the compression on first thing in the morning and removing them in the evening. The patient should not sleep in the compression.   In addition, behavioral modification throughout the day will be continued.  This will include frequent elevation (such as in a recliner), use of over the counter pain medications as needed and exercise such as walking.  The systemic causes for chronic edema such as liver, kidney and cardiac etiologies does not appear to have significant changed over the past year.    The patient  will continue aggressive use of the  lymph pump.  This will continue to improve the edema control and prevent sequela such as ulcers and  infections.   The patient will follow-up with me on an annual basis.  4. Primary hypertension Continue antihypertensive medications as already ordered, these medications have been reviewed and there are no changes at this time.   5. Diabetes mellitus without complication ([place]) Continue hypoglycemic medications as already ordered, these medications have been reviewed and there are no changes at this time.  Hgb A1C to be monitored as already arranged by primary service   6. ESRD (end stage renal disease) ([place]) See #1 - VAS US DUPLEX DIALYSIS ACCESS (AVF, AVG)    [name], MD  06/23/2022 3:01 PM

## 2022-06-24 ENCOUNTER — Other Ambulatory Visit (INDEPENDENT_AMBULATORY_CARE_PROVIDER_SITE_OTHER): Payer: Self-pay | Admitting: Vascular Surgery

## 2022-06-24 DIAGNOSIS — N2581 Secondary hyperparathyroidism of renal origin: Secondary | ICD-10-CM | POA: Diagnosis not present

## 2022-06-24 DIAGNOSIS — Z992 Dependence on renal dialysis: Secondary | ICD-10-CM | POA: Diagnosis not present

## 2022-06-24 DIAGNOSIS — N186 End stage renal disease: Secondary | ICD-10-CM | POA: Diagnosis not present

## 2022-06-25 DIAGNOSIS — N186 End stage renal disease: Secondary | ICD-10-CM | POA: Diagnosis not present

## 2022-06-25 DIAGNOSIS — N2581 Secondary hyperparathyroidism of renal origin: Secondary | ICD-10-CM | POA: Diagnosis not present

## 2022-06-25 DIAGNOSIS — Z992 Dependence on renal dialysis: Secondary | ICD-10-CM | POA: Diagnosis not present

## 2022-06-25 NOTE — Progress Notes (Signed)
06/28/22 9:02 AM   [name] 1971-08-11 [id number]  Referring provider:  [name], MD [place],  [place] [address]   Urological history:  1. Increased prolactin  - pituitary MRI 04/2020 6 mm focus of transient hypoenhancement within the inferior right aspect of the pituitary gland. This finding is suspicious for a pituitary microadenoma given the provided history. Additionally, there is subtle leftward deviation of the pituitary stalk.  - followed by endocrinology, last seen 11/2021    2. Testosterone deficiency  - testosterone level 294, 07/2021  - followed by endocrinology     3. ED  - contributing factors of age, testosterone deficiency, DM, HTN, sleep apnea (untreated), history of smoking and ESRD  - SHIM 15 - did not tolerate the ICI - managed w/ sildenafil 50 mg, on-demand-dosing    4. BPH  -PSA 0.58, 07/2021      Chief Complaint  Patient presents with   Erectile Dysfunction     HPI: [name] is a 51 y.o.male with a past medical history of uncontrolled diabetes, morbid obesity, hyperprolactinemia and end-stage renal disease (dialysis schedule is Tuesday, Thursday and Saturdays) who presents today for medication refill.   He reports today that he has been followed by endocrinology for his pituitary gland and hypogonadism.   His TRT is still on hold at this time.   His endocrinologist is retiring.   He had run out of the sildenafil 50 mg, but he states it is effective.  He is not having any side effects with the sildenafil.  He had been taking horny goat weed in the meantime.  Patient still having spontaneous erections.  He denies any pain or curvature with erections.     SHIM     Row Name 06/28/22 0843         SHIM: Over the last 6 months:   How do you rate your confidence that you could get and keep an erection? Low     When you had erections with sexual stimulation, how often were your erections hard enough for penetration (entering  your partner)? Sometimes (about half the time)     During sexual intercourse, how often were you able to maintain your erection after you had penetrated (entered) your partner? Sometimes (about half the time)     During sexual intercourse, how difficult was it to maintain your erection to completion of intercourse? Difficult     When you attempted sexual intercourse, how often was it satisfactory for you? Most Times (much more than half the time)       SHIM Total Score   SHIM 15              Score: 1-7 Severe ED 8-11 Moderate ED 12-16 Mild-Moderate ED 17-21 Mild ED 22-25 No ED    PMH: Past Medical History:  Diagnosis Date   Anemia    Chronic kidney disease    14% FUNCTION   Diabetes mellitus without complication (HCC)    Diabetic retinopathy ([place])    Dyspnea    DOE   Dysrhythmia    End stage renal disease ([place])    History of orthopnea    error wrong patietn   Hypercholesteremia    error   Hyperkalemia    error wrong patient   Hypertension    Long-term insulin use (HCC)    Lymphedema of leg    Metabolic encephalopathy    error   Microalbuminuria    MSSA (methicillin susceptible  Staphylococcus aureus)    Obesity    PVD (peripheral vascular disease) (HCC)    Respiratory failure (HCC)    Error   Sepsis ([place])    error   Sleep apnea    no CPAP   UTI (urinary tract infection)    error   Venous ulcer (HCC)    Vitamin D deficiency     Surgical History: Past Surgical History:  Procedure Laterality Date   A/V FISTULAGRAM Right 10/24/2018   Procedure: A/V FISTULAGRAM;  Surgeon: Katha Cabal, MD;  Location: [facility];  Service: Cardiovascular;  Laterality: Right;   A/V FISTULAGRAM Right 11/24/2018   Procedure: A/V FISTULAGRAM;  Surgeon: Katha Cabal, MD;  Location: [facility];  Service: Cardiovascular;  Laterality: Right;   A/V FISTULAGRAM Left 07/31/2019   Procedure: A/V FISTULAGRAM;  Surgeon: Katha Cabal, MD;  Location:  [facility];  Service: Cardiovascular;  Laterality: Left;   A/V FISTULAGRAM Left 12/11/2019   Procedure: A/V FISTULAGRAM;  Surgeon: Katha Cabal, MD;  Location: [facility];  Service: Cardiovascular;  Laterality: Left;   A/V SHUNT INTERVENTION Right 02/21/2019   Procedure: A/V SHUNT INTERVENTION;  Surgeon: Katha Cabal, MD;  Location: [facility];  Service: Cardiovascular;  Laterality: Right;   APPLICATION OF WOUND VAC Left 10/03/2017   Procedure: APPLICATION OF WOUND VAC;  Surgeon: Algernon Huxley, MD;  Location: [facility];  Service: General;  Laterality: Left;   APPLICATION OF WOUND VAC Left 10/11/2017   Procedure: APPLICATION OF WOUND VAC;  Surgeon: Katha Cabal, MD;  Location: [facility];  Service: Vascular;  Laterality: Left;   APPLICATION OF WOUND VAC Left 10/14/2017   Procedure: WOUND VAC CHANGE;  Surgeon: Katha Cabal, MD;  Location: [facility];  Service: Vascular;  Laterality: Left;  left lower leg   APPLICATION OF WOUND VAC Left 10/18/2017   Procedure: WOUND VAC CHANGE;  Surgeon: Katha Cabal, MD;  Location: [facility];  Service: Vascular;  Laterality: Left;   APPLICATION OF WOUND VAC Left 10/07/2017   Procedure: APPLICATION OF WOUND VAC;  Surgeon: Katha Cabal, MD;  Location: [facility];  Service: Vascular;  Laterality: Left;   AV FISTULA INSERTION W/ RF MAGNETIC GUIDANCE Right 07/19/2018   Procedure: AV FISTULA INSERTION W/RF MAGNETIC GUIDANCE;  Surgeon: Katha Cabal, MD;  Location: [facility];  Service: Cardiovascular;  Laterality: Right;   AV FISTULA PLACEMENT Left 05/11/2019   Procedure: ARTERIOVENOUS (AV) FISTULA CREATION (RADIOCEPHALIC );  Surgeon: Katha Cabal, MD;  Location: [facility];  Service: Vascular;  Laterality: Left;   AV FISTULA PLACEMENT Left 02/20/2020   Procedure: INSERTION OF ARTERIOVENOUS (AV) GORE-TEX GRAFT LEFT ARM ( FOREARM LOOP );  Surgeon: Katha Cabal, MD;  Location: [facility];  Service:  Vascular;  Laterality: Left;   CATARACT EXTRACTION W/PHACO Left 11/01/2018   Procedure: CATARACT EXTRACTION PHACO AND INTRAOCULAR LENS PLACEMENT (IOC)-LEFT, DIABETIC-INSULIN DEPENDENT;  Surgeon: Eulogio Bear, MD;  Location: [facility];  Service: Ophthalmology;  Laterality: Left;  [place] 00:41.8 CDE 4.61 Fluid Pack Lot # [id number]   CATARACT EXTRACTION W/PHACO Right 04/27/2019   Procedure: CATARACT EXTRACTION PHACO AND INTRAOCULAR LENS PLACEMENT (IOC);  Surgeon: Eulogio Bear, MD;  Location: [facility];  Service: Ophthalmology;  Laterality: Right;  [place] 00:34 CDE 1.97 FLUID PACK LOT # [id number]    COLONOSCOPY WITH PROPOFOL N/A 05/12/2020   Procedure: COLONOSCOPY WITH PROPOFOL;  Surgeon: Alice Reichert, Benay Pike, MD;  Location: [facility];  Service: Gastroenterology;  Laterality: N/A;   DIALYSIS/PERMA CATHETER INSERTION N/A 02/23/2019   Procedure: DIALYSIS/PERMA CATHETER INSERTION;  Surgeon: Algernon Huxley, MD;  Location: [facility];  Service: Cardiovascular;  Laterality: N/A;   DIALYSIS/PERMA CATHETER REMOVAL N/A 04/03/2020   Procedure: DIALYSIS/PERMA CATHETER REMOVAL;  Surgeon: Katha Cabal, MD;  Location: [facility];  Service: Cardiovascular;  Laterality: N/A;   DIALYSIS/PERMA CATHETER REMOVAL N/A 02/03/2022   Procedure: DIALYSIS/PERMA CATHETER REMOVAL;  Surgeon: Katha Cabal, MD;  Location: [facility];  Service: Cardiovascular;  Laterality: N/A;   I & D EXTREMITY Left 10/11/2017   Procedure: IRRIGATION AND DEBRIDEMENT EXTREMITY;  Surgeon: Katha Cabal, MD;  Location: [facility];  Service: Vascular;  Laterality: Left;   INCISION AND DRAINAGE ABSCESS Right 10/07/2017   Procedure: INCISION AND DRAINAGE ABSCESS;  Surgeon: Katha Cabal, MD;  Location: [facility];  Service: Vascular;  Laterality: Right;   IRRIGATION AND DEBRIDEMENT ABSCESS Left 10/03/2017   Procedure: IRRIGATION AND DEBRIDEMENT ABSCESS with debridement of skin, soft tissue, muscle 50sq cm;  Surgeon:  Algernon Huxley, MD;  Location: [facility];  Service: General;  Laterality: Left;   TEMPORARY DIALYSIS CATHETER  02/20/2019   Procedure: TEMPORARY DIALYSIS CATHETER;  Surgeon: Katha Cabal, MD;  Location: [facility];  Service: Cardiovascular;;   UPPER EXTREMITY ANGIOGRAPHY Left 09/18/2019   Procedure: UPPER EXTREMITY ANGIOGRAPHY;  Surgeon: Katha Cabal, MD;  Location: [facility];  Service: Cardiovascular;  Laterality: Left;    Home Medications:  Allergies as of 06/28/2022       Reactions   Lisinopril Swelling   Lips mouth    Furosemide Cough   Light headed, blurry vision, weakness.   Latex    Adhesive [tape] Itching, Rash        Medication List        Accurate as of June 28, 2022  9:02 AM. If you have any questions, ask your nurse or doctor.          STOP taking these medications    DOCOSAHEXAENOIC ACID-EPA PO Stopped by: Rusty Villella, PA-C   GaviLyte-C 240 g solution Generic drug: polyethylene glycol Stopped by: Cosimo Schertzer, PA-C   metolazone 5 MG tablet Commonly known as: ZAROXOLYN Stopped by: Ronella Plunk, PA-C   NONFORMULARY OR COMPOUNDED ITEM Stopped by: Rebeccah Ivins, PA-C   OMEGA-3-6-9 PO Stopped by: Zara Council, PA-C       TAKE these medications    allopurinol 100 MG tablet Commonly known as: ZYLOPRIM Take 100 mg by mouth daily.   amLODipine 10 MG tablet Commonly known as: NORVASC Take 5 mg by mouth daily as needed (if bp is 190/90 or higher).   amoxicillin-clavulanate 500-125 MG tablet Commonly known as: AUGMENTIN Take 1 tablet by mouth daily.   atenolol 50 MG tablet Commonly known as: TENORMIN Take 25 mg by mouth daily as needed (if bp is 190/90 or higher).   bromocriptine 2.5 MG tablet Commonly known as: PARLODEL TAKE 1 TABLET BY MOUTH AT BEDTIME.   calcium acetate 667 MG capsule Commonly known as: PHOSLO Take 1,334-2,001 mg by mouth See admin instructions. Take 2001 mg by mouth with  each meal & take 1334 mg by mouth with each snack.   cinacalcet 30 MG tablet Commonly known as: SENSIPAR Take 30 mg by mouth daily.   CINNAMON PO Take 500 mg by mouth 2 (two) times daily.   doxycycline 100 MG capsule Commonly  known as: VIBRAMYCIN Take 100 mg by mouth 2 (two) times daily.   EYE HEALTH PO Take 1 capsule by mouth daily.   ibuprofen 200 MG tablet Commonly known as: ADVIL Take 200 mg by mouth every 6 (six) hours as needed for headache or moderate pain.   Lantus SoloStar 100 UNIT/ML Solostar Pen Generic drug: insulin glargine Inject 15 Units into the skin at bedtime.   lidocaine-prilocaine cream Commonly known as: EMLA Apply 1 application topically as needed (port access).   loratadine 10 MG tablet Commonly known as: CLARITIN Take 10 mg by mouth daily as needed for allergies.   melatonin 5 MG Tabs Take 5 mg by mouth at bedtime as needed (sleep).   multivitamin with minerals Tabs tablet Take 1 tablet by mouth 3 (three) times a week. Men's Multivitamin   mupirocin ointment 2 % Commonly known as: BACTROBAN Apply topically daily.   Rena-Vite Rx 1 MG Tabs Take 1 tablet by mouth 3 (three) times a week.   sildenafil 50 MG tablet Commonly known as: VIAGRA Take 1 tablet (50 mg total) by mouth daily as needed for erectile dysfunction.   SYSTANE OP Place 1 drop into both eyes daily as needed (dry eyes).   warfarin 5 MG tablet Commonly known as: COUMADIN Take 10 mg by mouth daily.        Allergies:  Allergies  Allergen Reactions   Lisinopril Swelling    Lips mouth    Furosemide Cough    Light headed, blurry vision, weakness.   Latex    Adhesive [Tape] Itching and Rash    Family History: Family History  Problem Relation Age of Onset   Diabetes Father    Kidney disease Father    Diabetes Daughter     Social History:  reports that he has quit smoking. His smoking use included cigars. He has been exposed to tobacco smoke. He quit smokeless  tobacco use about 4 years ago. He reports current alcohol use of about 1.0 standard drink of alcohol per week. He reports that he does not currently use drugs.   Physical Exam: BP (!) 179/92   Pulse 73   Ht _0  (1.93 m)   Wt (!) 407 lb (184.6 kg)   BMI 49.54 kg/m   Constitutional:  Well nourished. Alert and oriented, No acute distress. HEENT: [place] AT, moist mucus membranes.  Trachea midline Cardiovascular: No clubbing, cyanosis, or edema. Respiratory: Normal respiratory effort, no increased work of breathing. Neurologic: Grossly intact, no focal deficits, moving all 4 extremities. Psychiatric: Normal mood and affect.   Laboratory Data: Hemoglobin A1C 4.2 - 5.6 % 9.5 High    Average Blood Glucose (Calc) mg/dL [facility]  Narrative Performed by [facility] - [facility] Normal Range:    4.2 - 5.6%  Increased Risk:  5.7 - 6.4%  Diabetes:        >= 6.5%  Glycemic Control for adults with diabetes:  <7%    Specimen Collected: 04/30/22 08:43   Performed by: [place]: 04/30/22 14:11  Received From: [facility]  Result Received: 05/27/22 16:18   Cholesterol, Total 100 - 200 mg/dL 172   Triglyceride 35 - 199 mg/dL 196   HDL (High Density Lipoprotein) Cholesterol 29.0 - 71.0 mg/dL 35.4   LDL Calculated 0 - 130 mg/dL 97   VLDL Cholesterol mg/dL 39   Cholesterol/HDL Ratio  4.9   Resulting Agency  [facility] - LAB   Specimen Collected: 02/01/22 10:48   Performed by: [facility]: 02/01/22 16:57  Received From: [facility]  Result Received: 02/10/22 08:28   Glucose 70 - 110 mg/dL 150 High    Sodium 136 - 145 mmol/L 138   Potassium 3.6 - 5.1 mmol/L 5.2 High    Chloride 97 - 109 mmol/L 100   Carbon Dioxide (CO2) 22.0 - 32.0 mmol/L 26.4   Urea Nitrogen (BUN) 7 - 25 mg/dL 64 High    Creatinine 0.7 - 1.3 mg/dL 14.8 High    Glomerular  Filtration Rate (eGFR), MDRD Estimate >60 mL/min/1.73sq m 4 Low    Calcium 8.7 - 10.3 mg/dL 9.0   AST  8 - 39 U/L 17   ALT  6 - 57 U/L 17   Alk Phos (alkaline Phosphatase) 34 - 104 U/L 56   Albumin 3.5 - 4.8 g/dL 4.0   Bilirubin, Total 0.3 - 1.2 mg/dL 0.6   Protein, Total 6.1 - 7.9 g/dL 7.3   A/G Ratio 1.0 - 5.0 gm/dL 1.2   Resulting Agency  [facility] - LAB   Specimen Collected: 02/01/22 10:48   Performed by: [facility]: 02/01/22 16:57  Received From: [facility]  Result Received: 02/10/22 08:28  I have reviewed the labs.   Pertinent Imaging: N/A  Assessment & Plan:    ED  - I explained to the patient that in order to achieve an erection it takes good functioning of the nervous system (parasympathetic and rs, sympathetic, sensory and motor), good blood flow into the erectile tissue of the penis and a desire to have sex - I explained that conditions like diabetes, hypertension, coronary artery disease, peripheral vascular disease, smoking, alcohol consumption, age, sleep apnea and BPH can diminish the ability to have an erection - A recent study published in Sex Med 2018 Apr 13 revealed moderate to vigorous aerobic exercise for 40 minutes 4 times per week can decrease erectile problems caused by physical inactivity, obesity, hypertension, metabolic syndrome and/or cardiovascular diseases  - he would like to try a higher dose of the sildenafil because it takes several hours for the 50 mg dose to "kick in"   - will increase to the sildenafil to 100 mg, on-demand-dosing as he is not experiencing side effects on the 50 mg   2. Testosterone deficiency - Treatment on hold labs are WNL.  - followed by endocrinology - needs to call to get a new provider - number given   3. Increased prolactin - followed by endocrinology  Return in about 1 year (around 06/29/2023) for SHIM .  Alondra Sahni, [place] [address], [place], [place] [address] [phone]

## 2022-06-28 ENCOUNTER — Ambulatory Visit (INDEPENDENT_AMBULATORY_CARE_PROVIDER_SITE_OTHER): Payer: Medicare HMO | Admitting: Urology

## 2022-06-28 ENCOUNTER — Encounter: Payer: Self-pay | Admitting: Urology

## 2022-06-28 ENCOUNTER — Ambulatory Visit (INDEPENDENT_AMBULATORY_CARE_PROVIDER_SITE_OTHER): Payer: Medicare HMO | Admitting: Vascular Surgery

## 2022-06-28 ENCOUNTER — Encounter (INDEPENDENT_AMBULATORY_CARE_PROVIDER_SITE_OTHER): Payer: Self-pay | Admitting: Vascular Surgery

## 2022-06-28 ENCOUNTER — Ambulatory Visit (INDEPENDENT_AMBULATORY_CARE_PROVIDER_SITE_OTHER): Payer: Medicare HMO

## 2022-06-28 VITALS — BP 179/92 | HR 73 | Ht 76.0 in | Wt >= 6400 oz

## 2022-06-28 VITALS — BP 168/75 | HR 66 | Resp 16 | Wt >= 6400 oz

## 2022-06-28 DIAGNOSIS — N186 End stage renal disease: Secondary | ICD-10-CM | POA: Diagnosis not present

## 2022-06-28 DIAGNOSIS — N529 Male erectile dysfunction, unspecified: Secondary | ICD-10-CM | POA: Diagnosis not present

## 2022-06-28 DIAGNOSIS — E119 Type 2 diabetes mellitus without complications: Secondary | ICD-10-CM

## 2022-06-28 DIAGNOSIS — I10 Essential (primary) hypertension: Secondary | ICD-10-CM

## 2022-06-28 DIAGNOSIS — I872 Venous insufficiency (chronic) (peripheral): Secondary | ICD-10-CM

## 2022-06-28 DIAGNOSIS — I890 Lymphedema, not elsewhere classified: Secondary | ICD-10-CM | POA: Diagnosis not present

## 2022-06-28 MED ORDER — SILDENAFIL CITRATE 100 MG PO TABS: 100.0000 mg | ORAL_TABLET | Freq: Every day | ORAL | 3 refills | Status: DC | PRN

## 2022-06-28 NOTE — Addendum Note (Signed)
Addended by: Zara Council A on: 06/28/2022 09:09 AM   Modules accepted: Orders

## 2022-06-28 NOTE — Patient Instructions (Signed)
[facility] [phone]

## 2022-06-29 DIAGNOSIS — N2581 Secondary hyperparathyroidism of renal origin: Secondary | ICD-10-CM | POA: Diagnosis not present

## 2022-06-29 DIAGNOSIS — N186 End stage renal disease: Secondary | ICD-10-CM | POA: Diagnosis not present

## 2022-06-29 DIAGNOSIS — Z992 Dependence on renal dialysis: Secondary | ICD-10-CM | POA: Diagnosis not present

## 2022-06-30 ENCOUNTER — Encounter (INDEPENDENT_AMBULATORY_CARE_PROVIDER_SITE_OTHER): Payer: Self-pay | Admitting: Vascular Surgery

## 2022-06-30 DIAGNOSIS — N2581 Secondary hyperparathyroidism of renal origin: Secondary | ICD-10-CM | POA: Diagnosis not present

## 2022-06-30 DIAGNOSIS — Z992 Dependence on renal dialysis: Secondary | ICD-10-CM | POA: Diagnosis not present

## 2022-06-30 DIAGNOSIS — N186 End stage renal disease: Secondary | ICD-10-CM | POA: Diagnosis not present

## 2022-07-01 DIAGNOSIS — N186 End stage renal disease: Secondary | ICD-10-CM | POA: Diagnosis not present

## 2022-07-01 DIAGNOSIS — N2581 Secondary hyperparathyroidism of renal origin: Secondary | ICD-10-CM | POA: Diagnosis not present

## 2022-07-01 DIAGNOSIS — Z992 Dependence on renal dialysis: Secondary | ICD-10-CM | POA: Diagnosis not present

## 2022-07-03 DIAGNOSIS — N2581 Secondary hyperparathyroidism of renal origin: Secondary | ICD-10-CM | POA: Diagnosis not present

## 2022-07-03 DIAGNOSIS — Z992 Dependence on renal dialysis: Secondary | ICD-10-CM | POA: Diagnosis not present

## 2022-07-03 DIAGNOSIS — N186 End stage renal disease: Secondary | ICD-10-CM | POA: Diagnosis not present

## 2022-07-06 DIAGNOSIS — N2581 Secondary hyperparathyroidism of renal origin: Secondary | ICD-10-CM | POA: Diagnosis not present

## 2022-07-06 DIAGNOSIS — N186 End stage renal disease: Secondary | ICD-10-CM | POA: Diagnosis not present

## 2022-07-06 DIAGNOSIS — Z992 Dependence on renal dialysis: Secondary | ICD-10-CM | POA: Diagnosis not present

## 2022-07-08 DIAGNOSIS — N186 End stage renal disease: Secondary | ICD-10-CM | POA: Diagnosis not present

## 2022-07-08 DIAGNOSIS — N2581 Secondary hyperparathyroidism of renal origin: Secondary | ICD-10-CM | POA: Diagnosis not present

## 2022-07-08 DIAGNOSIS — Z992 Dependence on renal dialysis: Secondary | ICD-10-CM | POA: Diagnosis not present

## 2022-07-10 DIAGNOSIS — Z992 Dependence on renal dialysis: Secondary | ICD-10-CM | POA: Diagnosis not present

## 2022-07-10 DIAGNOSIS — N2581 Secondary hyperparathyroidism of renal origin: Secondary | ICD-10-CM | POA: Diagnosis not present

## 2022-07-10 DIAGNOSIS — N186 End stage renal disease: Secondary | ICD-10-CM | POA: Diagnosis not present

## 2022-07-13 DIAGNOSIS — N186 End stage renal disease: Secondary | ICD-10-CM | POA: Diagnosis not present

## 2022-07-13 DIAGNOSIS — Z992 Dependence on renal dialysis: Secondary | ICD-10-CM | POA: Diagnosis not present

## 2022-07-13 DIAGNOSIS — N2581 Secondary hyperparathyroidism of renal origin: Secondary | ICD-10-CM | POA: Diagnosis not present

## 2022-07-15 DIAGNOSIS — N2581 Secondary hyperparathyroidism of renal origin: Secondary | ICD-10-CM | POA: Diagnosis not present

## 2022-07-15 DIAGNOSIS — Z992 Dependence on renal dialysis: Secondary | ICD-10-CM | POA: Diagnosis not present

## 2022-07-15 DIAGNOSIS — N186 End stage renal disease: Secondary | ICD-10-CM | POA: Diagnosis not present

## 2022-07-17 DIAGNOSIS — Z992 Dependence on renal dialysis: Secondary | ICD-10-CM | POA: Diagnosis not present

## 2022-07-17 DIAGNOSIS — N2581 Secondary hyperparathyroidism of renal origin: Secondary | ICD-10-CM | POA: Diagnosis not present

## 2022-07-17 DIAGNOSIS — N186 End stage renal disease: Secondary | ICD-10-CM | POA: Diagnosis not present

## 2022-07-20 DIAGNOSIS — N186 End stage renal disease: Secondary | ICD-10-CM | POA: Diagnosis not present

## 2022-07-20 DIAGNOSIS — N2581 Secondary hyperparathyroidism of renal origin: Secondary | ICD-10-CM | POA: Diagnosis not present

## 2022-07-20 DIAGNOSIS — Z992 Dependence on renal dialysis: Secondary | ICD-10-CM | POA: Diagnosis not present

## 2022-07-21 DIAGNOSIS — N2581 Secondary hyperparathyroidism of renal origin: Secondary | ICD-10-CM | POA: Diagnosis not present

## 2022-07-21 DIAGNOSIS — Z992 Dependence on renal dialysis: Secondary | ICD-10-CM | POA: Diagnosis not present

## 2022-07-21 DIAGNOSIS — N186 End stage renal disease: Secondary | ICD-10-CM | POA: Diagnosis not present

## 2022-07-24 DIAGNOSIS — N2581 Secondary hyperparathyroidism of renal origin: Secondary | ICD-10-CM | POA: Diagnosis not present

## 2022-07-24 DIAGNOSIS — N186 End stage renal disease: Secondary | ICD-10-CM | POA: Diagnosis not present

## 2022-07-24 DIAGNOSIS — Z992 Dependence on renal dialysis: Secondary | ICD-10-CM | POA: Diagnosis not present

## 2022-07-27 DIAGNOSIS — Z992 Dependence on renal dialysis: Secondary | ICD-10-CM | POA: Diagnosis not present

## 2022-07-27 DIAGNOSIS — N2581 Secondary hyperparathyroidism of renal origin: Secondary | ICD-10-CM | POA: Diagnosis not present

## 2022-07-27 DIAGNOSIS — N186 End stage renal disease: Secondary | ICD-10-CM | POA: Diagnosis not present

## 2022-07-27 DIAGNOSIS — Z79899 Other long term (current) drug therapy: Secondary | ICD-10-CM | POA: Diagnosis not present

## 2022-07-29 DIAGNOSIS — N2581 Secondary hyperparathyroidism of renal origin: Secondary | ICD-10-CM | POA: Diagnosis not present

## 2022-07-29 DIAGNOSIS — Z992 Dependence on renal dialysis: Secondary | ICD-10-CM | POA: Diagnosis not present

## 2022-07-29 DIAGNOSIS — N186 End stage renal disease: Secondary | ICD-10-CM | POA: Diagnosis not present

## 2022-07-30 DIAGNOSIS — Z992 Dependence on renal dialysis: Secondary | ICD-10-CM | POA: Diagnosis not present

## 2022-07-30 DIAGNOSIS — N186 End stage renal disease: Secondary | ICD-10-CM | POA: Diagnosis not present

## 2022-07-30 DIAGNOSIS — N2581 Secondary hyperparathyroidism of renal origin: Secondary | ICD-10-CM | POA: Diagnosis not present

## 2022-07-31 DIAGNOSIS — N186 End stage renal disease: Secondary | ICD-10-CM | POA: Diagnosis not present

## 2022-07-31 DIAGNOSIS — Z992 Dependence on renal dialysis: Secondary | ICD-10-CM | POA: Diagnosis not present

## 2022-07-31 DIAGNOSIS — N2581 Secondary hyperparathyroidism of renal origin: Secondary | ICD-10-CM | POA: Diagnosis not present

## 2022-08-03 DIAGNOSIS — N2581 Secondary hyperparathyroidism of renal origin: Secondary | ICD-10-CM | POA: Diagnosis not present

## 2022-08-03 DIAGNOSIS — N186 End stage renal disease: Secondary | ICD-10-CM | POA: Diagnosis not present

## 2022-08-03 DIAGNOSIS — Z992 Dependence on renal dialysis: Secondary | ICD-10-CM | POA: Diagnosis not present

## 2022-08-05 DIAGNOSIS — Z5181 Encounter for therapeutic drug level monitoring: Secondary | ICD-10-CM | POA: Diagnosis not present

## 2022-08-05 DIAGNOSIS — Z992 Dependence on renal dialysis: Secondary | ICD-10-CM | POA: Diagnosis not present

## 2022-08-05 DIAGNOSIS — Z7901 Long term (current) use of anticoagulants: Secondary | ICD-10-CM | POA: Diagnosis not present

## 2022-08-05 DIAGNOSIS — N186 End stage renal disease: Secondary | ICD-10-CM | POA: Diagnosis not present

## 2022-08-05 DIAGNOSIS — N2581 Secondary hyperparathyroidism of renal origin: Secondary | ICD-10-CM | POA: Diagnosis not present

## 2022-08-07 DIAGNOSIS — N186 End stage renal disease: Secondary | ICD-10-CM | POA: Diagnosis not present

## 2022-08-07 DIAGNOSIS — N2581 Secondary hyperparathyroidism of renal origin: Secondary | ICD-10-CM | POA: Diagnosis not present

## 2022-08-07 DIAGNOSIS — Z992 Dependence on renal dialysis: Secondary | ICD-10-CM | POA: Diagnosis not present

## 2022-08-10 DIAGNOSIS — Z992 Dependence on renal dialysis: Secondary | ICD-10-CM | POA: Diagnosis not present

## 2022-08-10 DIAGNOSIS — N186 End stage renal disease: Secondary | ICD-10-CM | POA: Diagnosis not present

## 2022-08-10 DIAGNOSIS — N2581 Secondary hyperparathyroidism of renal origin: Secondary | ICD-10-CM | POA: Diagnosis not present

## 2022-08-12 DIAGNOSIS — N2581 Secondary hyperparathyroidism of renal origin: Secondary | ICD-10-CM | POA: Diagnosis not present

## 2022-08-12 DIAGNOSIS — Z992 Dependence on renal dialysis: Secondary | ICD-10-CM | POA: Diagnosis not present

## 2022-08-12 DIAGNOSIS — N186 End stage renal disease: Secondary | ICD-10-CM | POA: Diagnosis not present

## 2022-08-14 DIAGNOSIS — Z992 Dependence on renal dialysis: Secondary | ICD-10-CM | POA: Diagnosis not present

## 2022-08-14 DIAGNOSIS — N186 End stage renal disease: Secondary | ICD-10-CM | POA: Diagnosis not present

## 2022-08-14 DIAGNOSIS — N2581 Secondary hyperparathyroidism of renal origin: Secondary | ICD-10-CM | POA: Diagnosis not present

## 2022-08-17 DIAGNOSIS — N186 End stage renal disease: Secondary | ICD-10-CM | POA: Diagnosis not present

## 2022-08-17 DIAGNOSIS — N2581 Secondary hyperparathyroidism of renal origin: Secondary | ICD-10-CM | POA: Diagnosis not present

## 2022-08-17 DIAGNOSIS — Z992 Dependence on renal dialysis: Secondary | ICD-10-CM | POA: Diagnosis not present

## 2022-08-18 ENCOUNTER — Telehealth (INDEPENDENT_AMBULATORY_CARE_PROVIDER_SITE_OTHER): Payer: Self-pay | Admitting: Vascular Surgery

## 2022-08-18 NOTE — Telephone Encounter (Signed)
Pt called in stating that he wants appt with Dr. Delana Meyer. States that he has a spot on his left leg that feels and looks like a burn but he definitely has NOT burned it nor hit it in anyway. States that it stings and feels like a burn, no bleeding, some swelling and redness with some pain. States that he has been taking Tylenol '500mg'$  X2 and it does help with pain. Please advise.

## 2022-08-18 NOTE — Telephone Encounter (Signed)
He can come in tomorrow if there is available time with Dr. Delana Meyer

## 2022-08-19 ENCOUNTER — Ambulatory Visit (INDEPENDENT_AMBULATORY_CARE_PROVIDER_SITE_OTHER): Payer: Medicare HMO | Admitting: Vascular Surgery

## 2022-08-19 ENCOUNTER — Encounter (INDEPENDENT_AMBULATORY_CARE_PROVIDER_SITE_OTHER): Payer: Self-pay | Admitting: Vascular Surgery

## 2022-08-19 VITALS — BP 116/79 | HR 74 | Resp 17 | Ht 76.0 in | Wt 395.0 lb

## 2022-08-19 DIAGNOSIS — E119 Type 2 diabetes mellitus without complications: Secondary | ICD-10-CM | POA: Diagnosis not present

## 2022-08-19 DIAGNOSIS — I10 Essential (primary) hypertension: Secondary | ICD-10-CM | POA: Diagnosis not present

## 2022-08-19 DIAGNOSIS — I872 Venous insufficiency (chronic) (peripheral): Secondary | ICD-10-CM

## 2022-08-19 DIAGNOSIS — N186 End stage renal disease: Secondary | ICD-10-CM

## 2022-08-19 DIAGNOSIS — N2581 Secondary hyperparathyroidism of renal origin: Secondary | ICD-10-CM | POA: Diagnosis not present

## 2022-08-19 DIAGNOSIS — Z992 Dependence on renal dialysis: Secondary | ICD-10-CM | POA: Diagnosis not present

## 2022-08-19 MED ORDER — TRAMADOL HCL 50 MG PO TABS: 50.0000 mg | ORAL_TABLET | Freq: Every evening | ORAL | 0 refills | Status: DC | PRN

## 2022-08-19 NOTE — Progress Notes (Signed)
MRN : [id number]  [name] is a 51 y.o. (01/29/1971) male who presents with chief complaint of legs hurt and swell.  History of Present Illness:   Patient returns to the office sooner than expected secondary to an ulcer that is opened up in the left lateral ankle area.  This happened a week or so ago.  Initially there was a little bit of drainage but at this point he feels that it is actually improving significantly and there is no further drainage.  He is continue to wear compression.  He does note it seems to be more painful in the evening time and he has been using Tylenol with minimal benefit at night.  He has been dressing the area with antibiotic ointment and gauze bandage.  No fever or chills.  Of note the patient denies any trauma or impact injury that he is aware of.  No outpatient medications have been marked as taking for the 08/19/22 encounter (Appointment) with Delana Meyer, Dolores Lory, MD.   Current Facility-Administered Medications for the 08/19/22 encounter (Appointment) with Delana Meyer, Dolores Lory, MD  Medication   0.9 %  sodium chloride infusion    Past Medical History:  Diagnosis Date   Anemia    Chronic kidney disease    14% FUNCTION   Diabetes mellitus without complication ([place])    Diabetic retinopathy ([place])    Dyspnea    DOE   Dysrhythmia    End stage renal disease ([place])    History of orthopnea    error wrong patietn   Hypercholesteremia    error   Hyperkalemia    error wrong patient   Hypertension    Long-term insulin use ([place])    Lymphedema of leg    Metabolic encephalopathy    error   Microalbuminuria    MSSA (methicillin susceptible Staphylococcus aureus)    Obesity    PVD (peripheral vascular disease) ([place])    Respiratory failure ([facility])    Error   Sepsis ([place])    error   Sleep apnea    no CPAP   UTI (urinary tract infection)    error   Venous ulcer ([place])    Vitamin D deficiency     Past Surgical History:  Procedure Laterality  Date   A/V FISTULAGRAM Right 10/24/2018   Procedure: A/V FISTULAGRAM;  Surgeon: Katha Cabal, MD;  Location: [facility];  Service: Cardiovascular;  Laterality: Right;   A/V FISTULAGRAM Right 11/24/2018   Procedure: A/V FISTULAGRAM;  Surgeon: Katha Cabal, MD;  Location: [place];  Service: Cardiovascular;  Laterality: Right;   A/V FISTULAGRAM Left 07/31/2019   Procedure: A/V FISTULAGRAM;  Surgeon: Katha Cabal, MD;  Location: [facility];  Service: Cardiovascular;  Laterality: Left;   A/V FISTULAGRAM Left 12/11/2019   Procedure: A/V FISTULAGRAM;  Surgeon: Katha Cabal, MD;  Location: [facility];  Service: Cardiovascular;  Laterality: Left;   A/V SHUNT INTERVENTION Right 02/21/2019   Procedure: A/V SHUNT INTERVENTION;  Surgeon: Katha Cabal, MD;  Location: [place] [facility];  Service: Cardiovascular;  Laterality: Right;   APPLICATION OF WOUND VAC Left 10/03/2017   Procedure: APPLICATION OF WOUND VAC;  Surgeon: Algernon Huxley, MD;  Location: [facility];  Service: General;  Laterality: Left;   APPLICATION OF WOUND VAC Left 10/11/2017   Procedure: APPLICATION OF WOUND VAC;  Surgeon: Katha Cabal, MD;  Location: [facility];  Service: Vascular;  Laterality: Left;   APPLICATION OF WOUND VAC Left 10/14/2017   Procedure: WOUND VAC CHANGE;  Surgeon: Katha Cabal, MD;  Location: [facility];  Service: Vascular;  Laterality: Left;  left lower leg   APPLICATION OF WOUND VAC Left 10/18/2017   Procedure: WOUND VAC CHANGE;  Surgeon: Katha Cabal, MD;  Location: [facility];  Service: Vascular;  Laterality: Left;   APPLICATION OF WOUND VAC Left 10/07/2017   Procedure: APPLICATION OF WOUND VAC;  Surgeon: Katha Cabal, MD;  Location: [facility];  Service: Vascular;  Laterality: Left;   AV FISTULA INSERTION W/ RF MAGNETIC GUIDANCE Right 07/19/2018   Procedure: AV FISTULA INSERTION W/RF MAGNETIC GUIDANCE;  Surgeon: Katha Cabal, MD;   Location: [place] [facility];  Service: Cardiovascular;  Laterality: Right;   AV FISTULA PLACEMENT Left 05/11/2019   Procedure: ARTERIOVENOUS (AV) FISTULA CREATION (RADIOCEPHALIC );  Surgeon: Katha Cabal, MD;  Location: [facility];  Service: Vascular;  Laterality: Left;   AV FISTULA PLACEMENT Left 02/20/2020   Procedure: INSERTION OF ARTERIOVENOUS (AV) GORE-TEX GRAFT LEFT ARM ( FOREARM LOOP );  Surgeon: Katha Cabal, MD;  Location: [facility];  Service: Vascular;  Laterality: Left;   CATARACT EXTRACTION W/PHACO Left 11/01/2018   Procedure: CATARACT EXTRACTION PHACO AND INTRAOCULAR LENS PLACEMENT (IOC)-LEFT, DIABETIC-INSULIN DEPENDENT;  Surgeon: Eulogio Bear, MD;  Location: [facility];  Service: Ophthalmology;  Laterality: Left;  [place] 00:41.8 CDE 4.61 Fluid Pack Lot # [id number]   CATARACT EXTRACTION W/PHACO Right 04/27/2019   Procedure: CATARACT EXTRACTION PHACO AND INTRAOCULAR LENS PLACEMENT (IOC);  Surgeon: Eulogio Bear, MD;  Location: [facility];  Service: Ophthalmology;  Laterality: Right;  [place] 00:34 CDE 1.97 FLUID PACK LOT # [id number]    COLONOSCOPY WITH PROPOFOL N/A 05/12/2020   Procedure: COLONOSCOPY WITH PROPOFOL;  Surgeon: [place], Benay Pike, MD;  Location: [facility];  Service: Gastroenterology;  Laterality: N/A;   DIALYSIS/PERMA CATHETER INSERTION N/A 02/23/2019   Procedure: DIALYSIS/PERMA CATHETER INSERTION;  Surgeon: Algernon Huxley, MD;  Location: [place] [facility];  Service: Cardiovascular;  Laterality: N/A;   DIALYSIS/PERMA CATHETER REMOVAL N/A 04/03/2020   Procedure: DIALYSIS/PERMA CATHETER REMOVAL;  Surgeon: Katha Cabal, MD;  Location: [place] [facility];  Service: Cardiovascular;  Laterality: N/A;   DIALYSIS/PERMA CATHETER REMOVAL N/A 02/03/2022   Procedure: DIALYSIS/PERMA CATHETER REMOVAL;  Surgeon: Katha Cabal, MD;  Location: [place] [facility];  Service: Cardiovascular;  Laterality: N/A;   I & D EXTREMITY Left 10/11/2017   Procedure: IRRIGATION AND  DEBRIDEMENT EXTREMITY;  Surgeon: Katha Cabal, MD;  Location: [facility];  Service: Vascular;  Laterality: Left;   INCISION AND DRAINAGE ABSCESS Right 10/07/2017   Procedure: INCISION AND DRAINAGE ABSCESS;  Surgeon: Katha Cabal, MD;  Location: [facility];  Service: Vascular;  Laterality: Right;   IRRIGATION AND DEBRIDEMENT ABSCESS Left 10/03/2017   Procedure: IRRIGATION AND DEBRIDEMENT ABSCESS with debridement of skin, soft tissue, muscle 50sq cm;  Surgeon: Algernon Huxley, MD;  Location: [facility];  Service: General;  Laterality: Left;   TEMPORARY DIALYSIS CATHETER  02/20/2019   Procedure: TEMPORARY DIALYSIS CATHETER;  Surgeon: Katha Cabal, MD;  Location: [place] [facility];  Service: Cardiovascular;;   UPPER EXTREMITY ANGIOGRAPHY Left 09/18/2019   Procedure: UPPER EXTREMITY ANGIOGRAPHY;  Surgeon: Katha Cabal, MD;  Location: [place] [facility];  Service: Cardiovascular;  Laterality: Left;    Social History Social History   Tobacco Use   Smoking  status: Former    Types: Cigars    Passive exposure: Past   Smokeless tobacco: Former    Quit date: 09/30/2017   Tobacco comments:    occasional  Vaping Use   Vaping Use: Never used  Substance Use Topics   Alcohol use: Yes    Alcohol/week: 1.0 standard drink of alcohol    Types: 1 Cans of beer per week    Comment: beer   Drug use: Not Currently    Family History Family History  Problem Relation Age of Onset   Diabetes Father    Kidney disease Father    Diabetes Daughter     Allergies  Allergen Reactions   Lisinopril Swelling    Lips mouth    Furosemide Cough    Light headed, blurry vision, weakness.   Latex    Adhesive [Tape] Itching and Rash     REVIEW OF SYSTEMS (Negative unless checked)  Constitutional: '[]'$ Weight loss  '[]'$ Fever  '[]'$ Chills Cardiac: '[]'$ Chest pain   '[]'$ Chest pressure   '[]'$ Palpitations   '[]'$ Shortness of breath when laying flat   '[]'$ Shortness of breath with exertion. Vascular:  '[]'$ Pain in legs with  walking   '[x]'$ Pain in legs at rest  '[]'$ History of DVT   '[]'$ Phlebitis   '[x]'$ Swelling in legs   '[]'$ Varicose veins   '[]'$ Non-healing ulcers Pulmonary:   '[]'$ Uses home oxygen   '[]'$ Productive cough   '[]'$ Hemoptysis   '[]'$ Wheeze  '[]'$ COPD   '[]'$ Asthma Neurologic:  '[]'$ Dizziness   '[]'$ Seizures   '[]'$ History of stroke   '[]'$ History of TIA  '[]'$ Aphasia   '[]'$ Vissual changes   '[]'$ Weakness or numbness in arm   '[]'$ Weakness or numbness in leg Musculoskeletal:   '[]'$ Joint swelling   '[]'$ Joint pain   '[]'$ Low back pain Hematologic:  '[]'$ Easy bruising  '[]'$ Easy bleeding   '[]'$ Hypercoagulable state   '[]'$ Anemic Gastrointestinal:  '[]'$ Diarrhea   '[]'$ Vomiting  '[]'$ Gastroesophageal reflux/heartburn   '[]'$ Difficulty swallowing. Genitourinary:  '[]'$ Chronic kidney disease   '[]'$ Difficult urination  '[]'$ Frequent urination   '[]'$ Blood in urine Skin:  '[]'$ Rashes   '[]'$ Ulcers  Psychological:  '[]'$ History of anxiety   '[]'$  History of major depression.  Physical Examination  There were no vitals filed for this visit. There is no height or weight on file to calculate BMI. Gen: WD/WN, NAD Head: [place]/AT, No temporalis wasting.  Ear/Nose/Throat: Hearing grossly intact, nares w/o erythema or drainage, pinna without lesions Eyes: PER, EOMI, sclera nonicteric.  Neck: Supple, no gross masses.  No JVD.  Pulmonary:  Good air movement, no audible wheezing, no use of accessory muscles.  Cardiac: RRR, precordium not hyperdynamic. Vascular:  scattered varicosities present bilaterally.  Moderate venous stasis changes to the legs bilaterally.  3+ soft pitting edema bilaterally small less than a dime sized ulcer left lateral ankle area around the ankle is firm but there is no drainage no fluctuance it appears very superficial Vessel Right Left  Radial Palpable Palpable  Gastrointestinal: soft, non-distended. No guarding/no peritoneal signs.  Musculoskeletal: M/S 5/5 throughout.  No deformity.  Neurologic: CN 2-12 intact. Pain and light touch intact in extremities.  Symmetrical.  Speech is fluent. Motor exam  as listed above. Psychiatric: Judgment intact, Mood & affect appropriate for pt's clinical situation. Dermatologic: Venous rashes no ulcers noted.  No changes consistent with cellulitis. Lymph : No lichenification or skin changes of chronic lymphedema.  CBC Lab Results  Component Value Date   WBC 6.0 02/19/2020   HGB 13.1 09/29/2020   HCT 41.8 09/29/2020   MCV 95.0 02/19/2020   PLT 190 02/19/2020  BMET    Component Value Date/Time   NA 140 02/20/2020 0650   K 5.1 05/12/2020 0842   CL 98 02/20/2020 0650   CO2 28 02/19/2020 1316   GLUCOSE 209 (H) 02/20/2020 0650   BUN 37 (H) 02/20/2020 0650   CREATININE 12.00 (H) 02/20/2020 0650   CALCIUM 8.5 (L) 02/19/2020 1316   GFRNONAA 8 (L) 02/19/2020 1316   GFRAA 9 (L) 02/19/2020 1316   CrCl cannot be calculated (Patient's most recent lab result is older than the maximum 21 days allowed.).  COAG Lab Results  Component Value Date   INR 1.4 (H) 03/21/2020   INR 1.2 02/20/2020   INR 1.4 (H) 02/19/2020    Radiology No results found.   Assessment/Plan 1. Chronic venous insufficiency This appears to be a small inflammatory flareup.  Overall the appearance of the ulcer supports his statement that it is improving.  Based on this we discussed continuing the wound care he has been doing.  I did offer the option of an [place] boot but as long as the wound is getting better that seems to be overkill.  We did agree that if the wound should reverse and start to get bigger or have drainage that he would contact us immediately so that we could get him wrapped in an [employer].  I will give him a prescription for some tramadol so he can obtain pain relief at night.  He will follow-up with me in 3 weeks for a wound check  2. End stage renal disease ([place]) Recommend:   The patient is doing well and currently has adequate dialysis access. The patient's dialysis center is not reporting any access issues. Flow pattern is stable when compared to the  prior ultrasound.   The patient should have a duplex ultrasound of the dialysis access in 6 months. The patient will follow-up with me in the office after each ultrasound     3. Diabetes mellitus without complication ([place]) Continue hypoglycemic medications as already ordered, these medications have been reviewed and there are no changes at this time.  Hgb A1C to be monitored as already arranged by primary service  4. Primary hypertension Continue antihypertensive medications as already ordered, these medications have been reviewed and there are no changes at this time.    [name], MD  08/19/2022 1:23 PM

## 2022-08-21 DIAGNOSIS — Z992 Dependence on renal dialysis: Secondary | ICD-10-CM | POA: Diagnosis not present

## 2022-08-21 DIAGNOSIS — N2581 Secondary hyperparathyroidism of renal origin: Secondary | ICD-10-CM | POA: Diagnosis not present

## 2022-08-21 DIAGNOSIS — N186 End stage renal disease: Secondary | ICD-10-CM | POA: Diagnosis not present

## 2022-08-23 DIAGNOSIS — N2581 Secondary hyperparathyroidism of renal origin: Secondary | ICD-10-CM | POA: Diagnosis not present

## 2022-08-23 DIAGNOSIS — N186 End stage renal disease: Secondary | ICD-10-CM | POA: Diagnosis not present

## 2022-08-23 DIAGNOSIS — Z992 Dependence on renal dialysis: Secondary | ICD-10-CM | POA: Diagnosis not present

## 2022-08-24 DIAGNOSIS — N186 End stage renal disease: Secondary | ICD-10-CM | POA: Diagnosis not present

## 2022-08-24 DIAGNOSIS — N2581 Secondary hyperparathyroidism of renal origin: Secondary | ICD-10-CM | POA: Diagnosis not present

## 2022-08-24 DIAGNOSIS — Z992 Dependence on renal dialysis: Secondary | ICD-10-CM | POA: Diagnosis not present

## 2022-08-25 DIAGNOSIS — E113592 Type 2 diabetes mellitus with proliferative diabetic retinopathy without macular edema, left eye: Secondary | ICD-10-CM | POA: Diagnosis not present

## 2022-08-26 DIAGNOSIS — Z7901 Long term (current) use of anticoagulants: Secondary | ICD-10-CM | POA: Diagnosis not present

## 2022-08-26 DIAGNOSIS — Z992 Dependence on renal dialysis: Secondary | ICD-10-CM | POA: Diagnosis not present

## 2022-08-26 DIAGNOSIS — N2581 Secondary hyperparathyroidism of renal origin: Secondary | ICD-10-CM | POA: Diagnosis not present

## 2022-08-26 DIAGNOSIS — Z79899 Other long term (current) drug therapy: Secondary | ICD-10-CM | POA: Diagnosis not present

## 2022-08-26 DIAGNOSIS — N186 End stage renal disease: Secondary | ICD-10-CM | POA: Diagnosis not present

## 2022-08-26 DIAGNOSIS — Z5181 Encounter for therapeutic drug level monitoring: Secondary | ICD-10-CM | POA: Diagnosis not present

## 2022-08-28 DIAGNOSIS — Z992 Dependence on renal dialysis: Secondary | ICD-10-CM | POA: Diagnosis not present

## 2022-08-28 DIAGNOSIS — N186 End stage renal disease: Secondary | ICD-10-CM | POA: Diagnosis not present

## 2022-08-28 DIAGNOSIS — N2581 Secondary hyperparathyroidism of renal origin: Secondary | ICD-10-CM | POA: Diagnosis not present

## 2022-08-29 DIAGNOSIS — Z992 Dependence on renal dialysis: Secondary | ICD-10-CM | POA: Diagnosis not present

## 2022-08-29 DIAGNOSIS — N186 End stage renal disease: Secondary | ICD-10-CM | POA: Diagnosis not present

## 2022-08-30 DIAGNOSIS — N2581 Secondary hyperparathyroidism of renal origin: Secondary | ICD-10-CM | POA: Diagnosis not present

## 2022-08-30 DIAGNOSIS — Z992 Dependence on renal dialysis: Secondary | ICD-10-CM | POA: Diagnosis not present

## 2022-08-30 DIAGNOSIS — N186 End stage renal disease: Secondary | ICD-10-CM | POA: Diagnosis not present

## 2022-08-31 DIAGNOSIS — Z992 Dependence on renal dialysis: Secondary | ICD-10-CM | POA: Diagnosis not present

## 2022-08-31 DIAGNOSIS — N2581 Secondary hyperparathyroidism of renal origin: Secondary | ICD-10-CM | POA: Diagnosis not present

## 2022-08-31 DIAGNOSIS — N186 End stage renal disease: Secondary | ICD-10-CM | POA: Diagnosis not present

## 2022-09-02 DIAGNOSIS — N2581 Secondary hyperparathyroidism of renal origin: Secondary | ICD-10-CM | POA: Diagnosis not present

## 2022-09-02 DIAGNOSIS — N186 End stage renal disease: Secondary | ICD-10-CM | POA: Diagnosis not present

## 2022-09-02 DIAGNOSIS — Z992 Dependence on renal dialysis: Secondary | ICD-10-CM | POA: Diagnosis not present

## 2022-09-04 DIAGNOSIS — N2581 Secondary hyperparathyroidism of renal origin: Secondary | ICD-10-CM | POA: Diagnosis not present

## 2022-09-04 DIAGNOSIS — N186 End stage renal disease: Secondary | ICD-10-CM | POA: Diagnosis not present

## 2022-09-04 DIAGNOSIS — Z992 Dependence on renal dialysis: Secondary | ICD-10-CM | POA: Diagnosis not present

## 2022-09-07 DIAGNOSIS — N2581 Secondary hyperparathyroidism of renal origin: Secondary | ICD-10-CM | POA: Diagnosis not present

## 2022-09-07 DIAGNOSIS — N186 End stage renal disease: Secondary | ICD-10-CM | POA: Diagnosis not present

## 2022-09-07 DIAGNOSIS — Z992 Dependence on renal dialysis: Secondary | ICD-10-CM | POA: Diagnosis not present

## 2022-09-09 DIAGNOSIS — Z992 Dependence on renal dialysis: Secondary | ICD-10-CM | POA: Diagnosis not present

## 2022-09-09 DIAGNOSIS — N2581 Secondary hyperparathyroidism of renal origin: Secondary | ICD-10-CM | POA: Diagnosis not present

## 2022-09-09 DIAGNOSIS — N186 End stage renal disease: Secondary | ICD-10-CM | POA: Diagnosis not present

## 2022-09-11 DIAGNOSIS — N186 End stage renal disease: Secondary | ICD-10-CM | POA: Diagnosis not present

## 2022-09-11 DIAGNOSIS — N2581 Secondary hyperparathyroidism of renal origin: Secondary | ICD-10-CM | POA: Diagnosis not present

## 2022-09-11 DIAGNOSIS — Z992 Dependence on renal dialysis: Secondary | ICD-10-CM | POA: Diagnosis not present

## 2022-09-14 DIAGNOSIS — Z992 Dependence on renal dialysis: Secondary | ICD-10-CM | POA: Diagnosis not present

## 2022-09-14 DIAGNOSIS — N186 End stage renal disease: Secondary | ICD-10-CM | POA: Diagnosis not present

## 2022-09-14 DIAGNOSIS — N2581 Secondary hyperparathyroidism of renal origin: Secondary | ICD-10-CM | POA: Diagnosis not present

## 2022-09-16 DIAGNOSIS — N2581 Secondary hyperparathyroidism of renal origin: Secondary | ICD-10-CM | POA: Diagnosis not present

## 2022-09-16 DIAGNOSIS — N186 End stage renal disease: Secondary | ICD-10-CM | POA: Diagnosis not present

## 2022-09-16 DIAGNOSIS — Z992 Dependence on renal dialysis: Secondary | ICD-10-CM | POA: Diagnosis not present

## 2022-09-18 DIAGNOSIS — Z992 Dependence on renal dialysis: Secondary | ICD-10-CM | POA: Diagnosis not present

## 2022-09-18 DIAGNOSIS — N2581 Secondary hyperparathyroidism of renal origin: Secondary | ICD-10-CM | POA: Diagnosis not present

## 2022-09-18 DIAGNOSIS — N186 End stage renal disease: Secondary | ICD-10-CM | POA: Diagnosis not present

## 2022-09-21 DIAGNOSIS — E1122 Type 2 diabetes mellitus with diabetic chronic kidney disease: Secondary | ICD-10-CM | POA: Diagnosis not present

## 2022-09-21 DIAGNOSIS — I120 Hypertensive chronic kidney disease with stage 5 chronic kidney disease or end stage renal disease: Secondary | ICD-10-CM | POA: Diagnosis not present

## 2022-09-21 DIAGNOSIS — T82868A Thrombosis of vascular prosthetic devices, implants and grafts, initial encounter: Secondary | ICD-10-CM | POA: Diagnosis not present

## 2022-09-21 DIAGNOSIS — M79604 Pain in right leg: Secondary | ICD-10-CM | POA: Diagnosis not present

## 2022-09-21 DIAGNOSIS — G4730 Sleep apnea, unspecified: Secondary | ICD-10-CM | POA: Diagnosis not present

## 2022-09-21 DIAGNOSIS — M1030 Gout due to renal impairment, unspecified site: Secondary | ICD-10-CM | POA: Diagnosis not present

## 2022-09-21 DIAGNOSIS — N186 End stage renal disease: Secondary | ICD-10-CM | POA: Diagnosis not present

## 2022-09-21 DIAGNOSIS — I871 Compression of vein: Secondary | ICD-10-CM | POA: Diagnosis not present

## 2022-09-21 DIAGNOSIS — Z79899 Other long term (current) drug therapy: Secondary | ICD-10-CM | POA: Diagnosis not present

## 2022-09-21 DIAGNOSIS — T82858A Stenosis of vascular prosthetic devices, implants and grafts, initial encounter: Secondary | ICD-10-CM | POA: Diagnosis not present

## 2022-09-21 DIAGNOSIS — Z992 Dependence on renal dialysis: Secondary | ICD-10-CM | POA: Diagnosis not present

## 2022-09-22 DIAGNOSIS — Z992 Dependence on renal dialysis: Secondary | ICD-10-CM | POA: Diagnosis not present

## 2022-09-22 DIAGNOSIS — N2581 Secondary hyperparathyroidism of renal origin: Secondary | ICD-10-CM | POA: Diagnosis not present

## 2022-09-22 DIAGNOSIS — N186 End stage renal disease: Secondary | ICD-10-CM | POA: Diagnosis not present

## 2022-09-23 DIAGNOSIS — Z992 Dependence on renal dialysis: Secondary | ICD-10-CM | POA: Diagnosis not present

## 2022-09-23 DIAGNOSIS — N2581 Secondary hyperparathyroidism of renal origin: Secondary | ICD-10-CM | POA: Diagnosis not present

## 2022-09-23 DIAGNOSIS — N186 End stage renal disease: Secondary | ICD-10-CM | POA: Diagnosis not present

## 2022-09-23 DIAGNOSIS — E119 Type 2 diabetes mellitus without complications: Secondary | ICD-10-CM | POA: Diagnosis not present

## 2022-09-23 DIAGNOSIS — Z5181 Encounter for therapeutic drug level monitoring: Secondary | ICD-10-CM | POA: Diagnosis not present

## 2022-09-23 DIAGNOSIS — Z794 Long term (current) use of insulin: Secondary | ICD-10-CM | POA: Diagnosis not present

## 2022-09-23 DIAGNOSIS — Z7901 Long term (current) use of anticoagulants: Secondary | ICD-10-CM | POA: Diagnosis not present

## 2022-09-23 DIAGNOSIS — R509 Fever, unspecified: Secondary | ICD-10-CM | POA: Diagnosis not present

## 2022-09-25 DIAGNOSIS — N186 End stage renal disease: Secondary | ICD-10-CM | POA: Diagnosis not present

## 2022-09-25 DIAGNOSIS — Z992 Dependence on renal dialysis: Secondary | ICD-10-CM | POA: Diagnosis not present

## 2022-09-25 DIAGNOSIS — N2581 Secondary hyperparathyroidism of renal origin: Secondary | ICD-10-CM | POA: Diagnosis not present

## 2022-09-26 NOTE — Progress Notes (Signed)
MRN : [id number]  [name] is a 52 y.o. (20-Nov-1970) male who presents with chief complaint of legs hurt and swell.  History of Present Illness:   Patient returns for follow-up evaluation of a left leg ulcer.  He was seen approximately 1 month ago after the ulcer abruptly developed.  Today he notes that the pain has significantly increased he has been having fevers and some shaking chills.  He also notes that his leg has gotten much tighter, the skin has become much more shiny and there is a marked increase in erythema.   By history this happened a month or so ago. Initially there was a little bit of drainage but at this point he feels that it is actually improving significantly and there is no further drainage. He is continue to wear compression. He does note it seems to be more painful in the evening time and he has been using Tylenol with minimal benefit at night. He has been dressing the area with antibiotic ointment and gauze bandage. No fever or chills. Of note the patient denies any trauma or impact injury that he is aware of   No outpatient medications have been marked as taking for the 09/27/22 encounter (Appointment) with Delana Meyer, Dolores Lory, MD.   Current Facility-Administered Medications for the 09/27/22 encounter (Appointment) with Delana Meyer, Dolores Lory, MD  Medication   0.9 %  sodium chloride infusion    Past Medical History:  Diagnosis Date   Anemia    Chronic kidney disease    14% FUNCTION   Diabetes mellitus without complication ([place])    Diabetic retinopathy ([place])    Dyspnea    DOE   Dysrhythmia    End stage renal disease ([place])    History of orthopnea    error wrong patietn   Hypercholesteremia    error   Hyperkalemia    error wrong patient   Hypertension    Long-term insulin use ([place])    Lymphedema of leg    Metabolic encephalopathy    error   Microalbuminuria    MSSA (methicillin susceptible Staphylococcus aureus)    Obesity    PVD  (peripheral vascular disease) ([place])    Respiratory failure (HCC)    Error   Sepsis ([place])    error   Sleep apnea    no CPAP   UTI (urinary tract infection)    error   Venous ulcer ([place])    Vitamin D deficiency     Past Surgical History:  Procedure Laterality Date   A/V FISTULAGRAM Right 10/24/2018   Procedure: A/V FISTULAGRAM;  Surgeon: Katha Cabal, MD;  Location: [facility];  Service: Cardiovascular;  Laterality: Right;   A/V FISTULAGRAM Right 11/24/2018   Procedure: A/V FISTULAGRAM;  Surgeon: Katha Cabal, MD;  Location: [facility];  Service: Cardiovascular;  Laterality: Right;   A/V FISTULAGRAM Left 07/31/2019   Procedure: A/V FISTULAGRAM;  Surgeon: Katha Cabal, MD;  Location: [facility];  Service: Cardiovascular;  Laterality: Left;   A/V FISTULAGRAM Left 12/11/2019   Procedure: A/V FISTULAGRAM;  Surgeon: Katha Cabal, MD;  Location: [facility];  Service: Cardiovascular;  Laterality: Left;   A/V SHUNT INTERVENTION Right 02/21/2019   Procedure: A/V SHUNT INTERVENTION;  Surgeon: Katha Cabal, MD;  Location: [facility];  Service: Cardiovascular;  Laterality: Right;   APPLICATION OF WOUND VAC Left 10/03/2017   Procedure:  APPLICATION OF WOUND VAC;  Surgeon: Algernon Huxley, MD;  Location: [facility];  Service: General;  Laterality: Left;   APPLICATION OF WOUND VAC Left 10/11/2017   Procedure: APPLICATION OF WOUND VAC;  Surgeon: Katha Cabal, MD;  Location: [facility];  Service: Vascular;  Laterality: Left;   APPLICATION OF WOUND VAC Left 10/14/2017   Procedure: WOUND VAC CHANGE;  Surgeon: Katha Cabal, MD;  Location: [facility];  Service: Vascular;  Laterality: Left;  left lower leg   APPLICATION OF WOUND VAC Left 10/18/2017   Procedure: WOUND VAC CHANGE;  Surgeon: Katha Cabal, MD;  Location: [facility];  Service: Vascular;  Laterality: Left;   APPLICATION OF WOUND VAC Left 10/07/2017   Procedure: APPLICATION  OF WOUND VAC;  Surgeon: Katha Cabal, MD;  Location: [facility];  Service: Vascular;  Laterality: Left;   AV FISTULA INSERTION W/ RF MAGNETIC GUIDANCE Right 07/19/2018   Procedure: AV FISTULA INSERTION W/RF MAGNETIC GUIDANCE;  Surgeon: Katha Cabal, MD;  Location: [facility];  Service: Cardiovascular;  Laterality: Right;   AV FISTULA PLACEMENT Left 05/11/2019   Procedure: ARTERIOVENOUS (AV) FISTULA CREATION (RADIOCEPHALIC );  Surgeon: Katha Cabal, MD;  Location: [facility];  Service: Vascular;  Laterality: Left;   AV FISTULA PLACEMENT Left 02/20/2020   Procedure: INSERTION OF ARTERIOVENOUS (AV) GORE-TEX GRAFT LEFT ARM ( FOREARM LOOP );  Surgeon: Katha Cabal, MD;  Location: [facility];  Service: Vascular;  Laterality: Left;   CATARACT EXTRACTION W/PHACO Left 11/01/2018   Procedure: CATARACT EXTRACTION PHACO AND INTRAOCULAR LENS PLACEMENT (IOC)-LEFT, DIABETIC-INSULIN DEPENDENT;  Surgeon: Eulogio Bear, MD;  Location: [facility];  Service: Ophthalmology;  Laterality: Left;  [place] 00:41.8 CDE 4.61 Fluid Pack Lot # [id number]   CATARACT EXTRACTION W/PHACO Right 04/27/2019   Procedure: CATARACT EXTRACTION PHACO AND INTRAOCULAR LENS PLACEMENT (IOC);  Surgeon: Eulogio Bear, MD;  Location: [facility];  Service: Ophthalmology;  Laterality: Right;  [place] 00:34 CDE 1.97 FLUID PACK LOT # [id number]    COLONOSCOPY WITH PROPOFOL N/A 05/12/2020   Procedure: COLONOSCOPY WITH PROPOFOL;  Surgeon: [place], Benay Pike, MD;  Location: [facility];  Service: Gastroenterology;  Laterality: N/A;   DIALYSIS/PERMA CATHETER INSERTION N/A 02/23/2019   Procedure: DIALYSIS/PERMA CATHETER INSERTION;  Surgeon: Algernon Huxley, MD;  Location: [facility];  Service: Cardiovascular;  Laterality: N/A;   DIALYSIS/PERMA CATHETER REMOVAL N/A 04/03/2020   Procedure: DIALYSIS/PERMA CATHETER REMOVAL;  Surgeon: Katha Cabal, MD;  Location: [facility];  Service: Cardiovascular;  Laterality: N/A;    DIALYSIS/PERMA CATHETER REMOVAL N/A 02/03/2022   Procedure: DIALYSIS/PERMA CATHETER REMOVAL;  Surgeon: Katha Cabal, MD;  Location: [facility];  Service: Cardiovascular;  Laterality: N/A;   I & D EXTREMITY Left 10/11/2017   Procedure: IRRIGATION AND DEBRIDEMENT EXTREMITY;  Surgeon: Katha Cabal, MD;  Location: [facility];  Service: Vascular;  Laterality: Left;   INCISION AND DRAINAGE ABSCESS Right 10/07/2017   Procedure: INCISION AND DRAINAGE ABSCESS;  Surgeon: Katha Cabal, MD;  Location: [facility];  Service: Vascular;  Laterality: Right;   IRRIGATION AND DEBRIDEMENT ABSCESS Left 10/03/2017   Procedure: IRRIGATION AND DEBRIDEMENT ABSCESS with debridement of skin, soft tissue, muscle 50sq cm;  Surgeon: Algernon Huxley, MD;  Location: [facility];  Service: General;  Laterality: Left;   TEMPORARY DIALYSIS CATHETER  02/20/2019   Procedure: TEMPORARY DIALYSIS CATHETER;  Surgeon: Katha Cabal, MD;  Location: [facility];  Service: Cardiovascular;;  UPPER EXTREMITY ANGIOGRAPHY Left 09/18/2019   Procedure: UPPER EXTREMITY ANGIOGRAPHY;  Surgeon: Katha Cabal, MD;  Location: [facility];  Service: Cardiovascular;  Laterality: Left;    Social History Social History   Tobacco Use   Smoking status: Former    Types: Cigars    Passive exposure: Past   Smokeless tobacco: Former    Quit date: 09/30/2017   Tobacco comments:    occasional  Vaping Use   Vaping Use: Never used  Substance Use Topics   Alcohol use: Yes    Alcohol/week: 1.0 standard drink of alcohol    Types: 1 Cans of beer per week    Comment: beer   Drug use: Not Currently    Family History Family History  Problem Relation Age of Onset   Diabetes Father    Kidney disease Father    Diabetes Daughter     Allergies  Allergen Reactions   Lisinopril Swelling    Lips mouth    Furosemide Cough    Light headed, blurry vision, weakness.   Latex    Adhesive [Tape] Itching and Rash      REVIEW OF SYSTEMS (Negative unless checked)  Constitutional: '[]'$ Weight loss  '[]'$ Fever  '[]'$ Chills Cardiac: '[]'$ Chest pain   '[]'$ Chest pressure   '[]'$ Palpitations   '[]'$ Shortness of breath when laying flat   '[]'$ Shortness of breath with exertion. Vascular:  '[]'$ Pain in legs with walking   '[x]'$ Pain in legs at rest  '[]'$ History of DVT   '[]'$ Phlebitis   '[x]'$ Swelling in legs   '[]'$ Varicose veins   '[]'$ Non-healing ulcers Pulmonary:   '[]'$ Uses home oxygen   '[]'$ Productive cough   '[]'$ Hemoptysis   '[]'$ Wheeze  '[]'$ COPD   '[]'$ Asthma Neurologic:  '[]'$ Dizziness   '[]'$ Seizures   '[]'$ History of stroke   '[]'$ History of TIA  '[]'$ Aphasia   '[]'$ Vissual changes   '[]'$ Weakness or numbness in arm   '[]'$ Weakness or numbness in leg Musculoskeletal:   '[]'$ Joint swelling   '[]'$ Joint pain   '[]'$ Low back pain Hematologic:  '[]'$ Easy bruising  '[]'$ Easy bleeding   '[]'$ Hypercoagulable state   '[]'$ Anemic Gastrointestinal:  '[]'$ Diarrhea   '[]'$ Vomiting  '[]'$ Gastroesophageal reflux/heartburn   '[]'$ Difficulty swallowing. Genitourinary:  '[]'$ Chronic kidney disease   '[]'$ Difficult urination  '[]'$ Frequent urination   '[]'$ Blood in urine Skin:  '[]'$ Rashes   '[]'$ Ulcers  Psychological:  '[]'$ History of anxiety   '[]'$  History of major depression.  Physical Examination  There were no vitals filed for this visit. There is no height or weight on file to calculate BMI. Gen: WD/WN, NAD Head: [place]/AT, No temporalis wasting.  Ear/Nose/Throat: Hearing grossly intact, nares w/o erythema or drainage, pinna without lesions Eyes: PER, EOMI, sclera nonicteric.  Neck: Supple, no gross masses.  No JVD.  Pulmonary:  Good air movement, no audible wheezing, no use of accessory muscles.  Cardiac: RRR, precordium not hyperdynamic. Vascular:  left leg demonstrates marked erythema as well as marked increase in warmth and a tense shininess of the skin no fluctuance is noted no significant drainage or purulence is identified at this time.  Severe  venous stasis changes to the legs bilaterally.  4+ soft pitting edema  Vessel Right Left   Radial Palpable Palpable  Gastrointestinal: soft, non-distended. No guarding/no peritoneal signs.  Musculoskeletal: M/S 5/5 throughout.  No deformity.  Neurologic: CN 2-12 intact. Pain and light touch intact in extremities.  Symmetrical.  Speech is fluent. Motor exam as listed above. Psychiatric: Judgment intact, Mood & affect appropriate for pt's clinical situation. Dermatologic: Venous rashes no ulcers noted.  No changes  consistent with cellulitis. Lymph : No lichenification or skin changes of chronic lymphedema.  CBC Lab Results  Component Value Date   WBC 6.0 02/19/2020   HGB 13.1 09/29/2020   HCT 41.8 09/29/2020   MCV 95.0 02/19/2020   PLT 190 02/19/2020    BMET    Component Value Date/Time   NA 140 02/20/2020 0650   K 5.1 05/12/2020 0842   CL 98 02/20/2020 0650   CO2 28 02/19/2020 1316   GLUCOSE 209 (H) 02/20/2020 0650   BUN 37 (H) 02/20/2020 0650   CREATININE 12.00 (H) 02/20/2020 0650   CALCIUM 8.5 (L) 02/19/2020 1316   GFRNONAA 8 (L) 02/19/2020 1316   GFRAA 9 (L) 02/19/2020 1316   CrCl cannot be calculated (Patient's most recent lab result is older than the maximum 21 days allowed.).  COAG Lab Results  Component Value Date   INR 1.4 (H) 03/21/2020   INR 1.2 02/20/2020   INR 1.4 (H) 02/19/2020    Radiology No results found.   Assessment/Plan 1. Venous ulcer of ankle, left (HCC) Recommend:  Patient clearly has worsened he now has obvious cellulitis.  We have discussed options and I have recommended he be admitted to the hospital for parenteral antibiotics.  He agrees with this.  He will head to the emergency room immediately from this office visit since we are no longer able to direct admit patients.  2. Chronic venous insufficiency Recommend:  Patient clearly has worsened he now has obvious cellulitis.  We have discussed options and I have recommended he be admitted to the hospital for parenteral antibiotics.  He agrees with this.  He will head to the  emergency room immediately from this office visit since we are no longer able to direct admit patients.  3. Primary hypertension Continue antihypertensive medications as already ordered, these medications have been reviewed and there are no changes at this time.  4. Type 1 diabetes mellitus with other circulatory complication (HCC) Continue hypoglycemic medications as already ordered, these medications have been reviewed and there are no changes at this time.  Hgb A1C to be monitored as already arranged by primary service  5. End-stage renal disease ([place]) At the present time the patient has adequate dialysis access.  Continue hemodialysis as ordered without interruption.  Avoid nephrotoxic medications and dehydration.  Further plans per nephrology    [name], MD  09/26/2022 9:47 AM

## 2022-09-27 ENCOUNTER — Other Ambulatory Visit: Payer: Self-pay

## 2022-09-27 ENCOUNTER — Encounter (INDEPENDENT_AMBULATORY_CARE_PROVIDER_SITE_OTHER): Payer: Self-pay | Admitting: Vascular Surgery

## 2022-09-27 ENCOUNTER — Emergency Department: Payer: Medicare HMO

## 2022-09-27 ENCOUNTER — Encounter: Payer: Self-pay | Admitting: Emergency Medicine

## 2022-09-27 ENCOUNTER — Ambulatory Visit (INDEPENDENT_AMBULATORY_CARE_PROVIDER_SITE_OTHER): Payer: Medicare HMO | Admitting: Vascular Surgery

## 2022-09-27 ENCOUNTER — Inpatient Hospital Stay
Admission: EM | Admit: 2022-09-27 | Discharge: 2022-10-05 | DRG: 602 | Disposition: A | Discharge status: Home Health | Payer: Medicare HMO | Attending: Internal Medicine | Admitting: Internal Medicine

## 2022-09-27 VITALS — BP 117/74 | HR 73 | Resp 16 | Wt 396.8 lb

## 2022-09-27 DIAGNOSIS — R0602 Shortness of breath: Secondary | ICD-10-CM | POA: Diagnosis not present

## 2022-09-27 DIAGNOSIS — Z833 Family history of diabetes mellitus: Secondary | ICD-10-CM

## 2022-09-27 DIAGNOSIS — I120 Hypertensive chronic kidney disease with stage 5 chronic kidney disease or end stage renal disease: Secondary | ICD-10-CM | POA: Diagnosis present

## 2022-09-27 DIAGNOSIS — Z91040 Latex allergy status: Secondary | ICD-10-CM

## 2022-09-27 DIAGNOSIS — Z7901 Long term (current) use of anticoagulants: Secondary | ICD-10-CM | POA: Diagnosis not present

## 2022-09-27 DIAGNOSIS — X58XXXA Exposure to other specified factors, initial encounter: Secondary | ICD-10-CM | POA: Diagnosis present

## 2022-09-27 DIAGNOSIS — L03116 Cellulitis of left lower limb: Principal | ICD-10-CM | POA: Diagnosis present

## 2022-09-27 DIAGNOSIS — D631 Anemia in chronic kidney disease: Secondary | ICD-10-CM | POA: Diagnosis not present

## 2022-09-27 DIAGNOSIS — I872 Venous insufficiency (chronic) (peripheral): Secondary | ICD-10-CM | POA: Diagnosis present

## 2022-09-27 DIAGNOSIS — J9601 Acute respiratory failure with hypoxia: Secondary | ICD-10-CM | POA: Diagnosis not present

## 2022-09-27 DIAGNOSIS — G4733 Obstructive sleep apnea (adult) (pediatric): Secondary | ICD-10-CM | POA: Diagnosis present

## 2022-09-27 DIAGNOSIS — I83023 Varicose veins of left lower extremity with ulcer of ankle: Secondary | ICD-10-CM | POA: Diagnosis not present

## 2022-09-27 DIAGNOSIS — L0390 Cellulitis, unspecified: Secondary | ICD-10-CM | POA: Diagnosis not present

## 2022-09-27 DIAGNOSIS — Z841 Family history of disorders of kidney and ureter: Secondary | ICD-10-CM

## 2022-09-27 DIAGNOSIS — L97909 Non-pressure chronic ulcer of unspecified part of unspecified lower leg with unspecified severity: Secondary | ICD-10-CM

## 2022-09-27 DIAGNOSIS — L97329 Non-pressure chronic ulcer of left ankle with unspecified severity: Secondary | ICD-10-CM | POA: Diagnosis present

## 2022-09-27 DIAGNOSIS — Z6841 Body Mass Index (BMI) 40.0 and over, adult: Secondary | ICD-10-CM | POA: Diagnosis not present

## 2022-09-27 DIAGNOSIS — Z888 Allergy status to other drugs, medicaments and biological substances status: Secondary | ICD-10-CM

## 2022-09-27 DIAGNOSIS — I10 Essential (primary) hypertension: Secondary | ICD-10-CM

## 2022-09-27 DIAGNOSIS — E1059 Type 1 diabetes mellitus with other circulatory complications: Secondary | ICD-10-CM

## 2022-09-27 DIAGNOSIS — Z794 Long term (current) use of insulin: Secondary | ICD-10-CM

## 2022-09-27 DIAGNOSIS — N186 End stage renal disease: Secondary | ICD-10-CM | POA: Diagnosis present

## 2022-09-27 DIAGNOSIS — E11622 Type 2 diabetes mellitus with other skin ulcer: Secondary | ICD-10-CM

## 2022-09-27 DIAGNOSIS — E7800 Pure hypercholesterolemia, unspecified: Secondary | ICD-10-CM | POA: Diagnosis present

## 2022-09-27 DIAGNOSIS — E559 Vitamin D deficiency, unspecified: Secondary | ICD-10-CM | POA: Diagnosis present

## 2022-09-27 DIAGNOSIS — S80822A Blister (nonthermal), left lower leg, initial encounter: Secondary | ICD-10-CM | POA: Diagnosis present

## 2022-09-27 DIAGNOSIS — Z992 Dependence on renal dialysis: Secondary | ICD-10-CM | POA: Diagnosis not present

## 2022-09-27 DIAGNOSIS — U071 COVID-19: Secondary | ICD-10-CM | POA: Diagnosis not present

## 2022-09-27 DIAGNOSIS — E875 Hyperkalemia: Secondary | ICD-10-CM | POA: Diagnosis present

## 2022-09-27 DIAGNOSIS — E1151 Type 2 diabetes mellitus with diabetic peripheral angiopathy without gangrene: Secondary | ICD-10-CM | POA: Diagnosis present

## 2022-09-27 DIAGNOSIS — Z87891 Personal history of nicotine dependence: Secondary | ICD-10-CM | POA: Diagnosis not present

## 2022-09-27 DIAGNOSIS — Z9841 Cataract extraction status, right eye: Secondary | ICD-10-CM

## 2022-09-27 DIAGNOSIS — Z91048 Other nonmedicinal substance allergy status: Secondary | ICD-10-CM

## 2022-09-27 DIAGNOSIS — E1165 Type 2 diabetes mellitus with hyperglycemia: Secondary | ICD-10-CM | POA: Diagnosis not present

## 2022-09-27 DIAGNOSIS — I890 Lymphedema, not elsewhere classified: Secondary | ICD-10-CM | POA: Diagnosis present

## 2022-09-27 DIAGNOSIS — Z86718 Personal history of other venous thrombosis and embolism: Secondary | ICD-10-CM | POA: Diagnosis not present

## 2022-09-27 DIAGNOSIS — J1282 Pneumonia due to coronavirus disease 2019: Secondary | ICD-10-CM | POA: Diagnosis not present

## 2022-09-27 DIAGNOSIS — R600 Localized edema: Secondary | ICD-10-CM | POA: Diagnosis not present

## 2022-09-27 DIAGNOSIS — R748 Abnormal levels of other serum enzymes: Secondary | ICD-10-CM | POA: Diagnosis present

## 2022-09-27 DIAGNOSIS — Z9842 Cataract extraction status, left eye: Secondary | ICD-10-CM

## 2022-09-27 DIAGNOSIS — N185 Chronic kidney disease, stage 5: Secondary | ICD-10-CM | POA: Diagnosis present

## 2022-09-27 DIAGNOSIS — N2581 Secondary hyperparathyroidism of renal origin: Secondary | ICD-10-CM | POA: Diagnosis not present

## 2022-09-27 DIAGNOSIS — Z79899 Other long term (current) drug therapy: Secondary | ICD-10-CM

## 2022-09-27 DIAGNOSIS — E66813 Obesity, class 3: Secondary | ICD-10-CM

## 2022-09-27 DIAGNOSIS — E119 Type 2 diabetes mellitus without complications: Secondary | ICD-10-CM

## 2022-09-27 DIAGNOSIS — E1122 Type 2 diabetes mellitus with diabetic chronic kidney disease: Secondary | ICD-10-CM | POA: Diagnosis not present

## 2022-09-27 DIAGNOSIS — Z961 Presence of intraocular lens: Secondary | ICD-10-CM | POA: Diagnosis present

## 2022-09-27 LAB — CBC
HCT: 36 % — ABNORMAL LOW (ref 39.0–52.0)
Hemoglobin: 11.7 g/dL — ABNORMAL LOW (ref 13.0–17.0)
MCH: 31.6 pg (ref 26.0–34.0)
MCHC: 32.5 g/dL (ref 30.0–36.0)
MCV: 97.3 fL (ref 80.0–100.0)
Platelets: 208 10*3/uL (ref 150–400)
RBC: 3.7 MIL/uL — ABNORMAL LOW (ref 4.22–5.81)
RDW: 13.3 % (ref 11.5–15.5)
WBC: 19.5 10*3/uL — ABNORMAL HIGH (ref 4.0–10.5)
nRBC: 0 % (ref 0.0–0.2)

## 2022-09-27 LAB — COMPREHENSIVE METABOLIC PANEL
ALT: 45 U/L — ABNORMAL HIGH (ref 0–44)
AST: 50 U/L — ABNORMAL HIGH (ref 15–41)
Albumin: 3 g/dL — ABNORMAL LOW (ref 3.5–5.0)
Alkaline Phosphatase: 121 U/L (ref 38–126)
Anion gap: 19 — ABNORMAL HIGH (ref 5–15)
BUN: 73 mg/dL — ABNORMAL HIGH (ref 6–20)
CO2: 24 mmol/L (ref 22–32)
Calcium: 9.1 mg/dL (ref 8.9–10.3)
Chloride: 89 mmol/L — ABNORMAL LOW (ref 98–111)
Creatinine, Ser: 13.69 mg/dL — ABNORMAL HIGH (ref 0.61–1.24)
GFR, Estimated: 4 mL/min — ABNORMAL LOW (ref >60)
Glucose, Bld: 213 mg/dL — ABNORMAL HIGH (ref 70–99)
Potassium: 4.6 mmol/L (ref 3.5–5.1)
Sodium: 132 mmol/L — ABNORMAL LOW (ref 135–145)
Total Bilirubin: 1.2 mg/dL (ref 0.3–1.2)
Total Protein: 8.6 g/dL — ABNORMAL HIGH (ref 6.5–8.1)

## 2022-09-27 LAB — RESP PANEL BY RT-PCR (RSV, FLU A&B, COVID)  RVPGX2
Influenza A by PCR: NEGATIVE
Influenza B by PCR: NEGATIVE
Resp Syncytial Virus by PCR: NEGATIVE
SARS Coronavirus 2 by RT PCR: POSITIVE — AB

## 2022-09-27 LAB — LACTIC ACID, PLASMA: Lactic Acid, Venous: 1.5 mmol/L (ref 0.5–1.9)

## 2022-09-27 LAB — CBG MONITORING, ED: Glucose-Capillary: 140 mg/dL — ABNORMAL HIGH (ref 70–99)

## 2022-09-27 LAB — PROTIME-INR
INR: 2.7 — ABNORMAL HIGH (ref 0.8–1.2)
Prothrombin Time: 28.6 seconds — ABNORMAL HIGH (ref 11.4–15.2)

## 2022-09-27 MED ORDER — VANCOMYCIN VARIABLE DOSE PER UNSTABLE RENAL FUNCTION (PHARMACIST DOSING): Status: DC

## 2022-09-27 MED ORDER — WARFARIN SODIUM 10 MG PO TABS
10.0000 mg | ORAL_TABLET | Freq: Once | ORAL | Status: AC
  Administered 2022-09-28: 10 mg via ORAL
  Filled 2022-09-27 (×2): qty 1

## 2022-09-27 MED ORDER — INSULIN ASPART 100 UNIT/ML IJ SOLN
0.0000 [IU] | Freq: Three times a day (TID) | INTRAMUSCULAR | Status: DC
  Administered 2022-09-28: 5 [IU] via SUBCUTANEOUS
  Administered 2022-09-28: 2 [IU] via SUBCUTANEOUS
  Administered 2022-09-28 – 2022-09-29 (×2): 3 [IU] via SUBCUTANEOUS
  Administered 2022-09-29 (×2): 8 [IU] via SUBCUTANEOUS
  Administered 2022-09-30: 11 [IU] via SUBCUTANEOUS
  Administered 2022-09-30: 15 [IU] via SUBCUTANEOUS
  Administered 2022-09-30: 3 [IU] via SUBCUTANEOUS
  Administered 2022-10-01: 8 [IU] via SUBCUTANEOUS
  Administered 2022-10-01: 5 [IU] via SUBCUTANEOUS
  Administered 2022-10-01 – 2022-10-02 (×2): 8 [IU] via SUBCUTANEOUS
  Administered 2022-10-02: 5 [IU] via SUBCUTANEOUS
  Administered 2022-10-02: 8 [IU] via SUBCUTANEOUS
  Administered 2022-10-03: 2 [IU] via SUBCUTANEOUS
  Administered 2022-10-03: 3 [IU] via SUBCUTANEOUS
  Administered 2022-10-04 – 2022-10-05 (×3): 2 [IU] via SUBCUTANEOUS
  Filled 2022-09-27 (×20): qty 1

## 2022-09-27 MED ORDER — CEFTRIAXONE SODIUM 2 G IJ SOLR
2.0000 g | Freq: Once | INTRAMUSCULAR | Status: AC
  Administered 2022-09-27: 2 g via INTRAVENOUS
  Filled 2022-09-27: qty 20

## 2022-09-27 MED ORDER — HYDROMORPHONE HCL 1 MG/ML IJ SOLN
1.0000 mg | Freq: Once | INTRAMUSCULAR | Status: AC
  Administered 2022-09-27: 1 mg via INTRAVENOUS
  Filled 2022-09-27: qty 1

## 2022-09-27 MED ORDER — WARFARIN - PHARMACIST DOSING INPATIENT
Freq: Every day | Status: DC
  Filled 2022-09-27: qty 1

## 2022-09-27 MED ORDER — SODIUM CHLORIDE 0.9 % IV SOLN
200.0000 mg | Freq: Once | INTRAVENOUS | Status: AC
  Administered 2022-09-27: 200 mg via INTRAVENOUS
  Filled 2022-09-27: qty 40

## 2022-09-27 MED ORDER — INSULIN ASPART 100 UNIT/ML IJ SOLN
0.0000 [IU] | Freq: Every day | INTRAMUSCULAR | Status: DC
  Administered 2022-09-28: 2 [IU] via SUBCUTANEOUS
  Administered 2022-09-29: 3 [IU] via SUBCUTANEOUS
  Administered 2022-09-30 – 2022-10-02 (×3): 2 [IU] via SUBCUTANEOUS
  Filled 2022-09-27 (×7): qty 1

## 2022-09-27 MED ORDER — VANCOMYCIN HCL 2000 MG/400ML IV SOLN
2000.0000 mg | Freq: Once | INTRAVENOUS | Status: AC
  Administered 2022-09-27: 2000 mg via INTRAVENOUS
  Filled 2022-09-27: qty 400

## 2022-09-27 MED ORDER — VANCOMYCIN HCL 500 MG/100ML IV SOLN
500.0000 mg | Freq: Once | INTRAVENOUS | Status: AC
  Administered 2022-09-28: 500 mg via INTRAVENOUS
  Filled 2022-09-27: qty 100

## 2022-09-27 MED ORDER — SODIUM CHLORIDE 0.9 % IV SOLN
100.0000 mg | Freq: Every day | INTRAVENOUS | Status: AC
  Administered 2022-09-28 – 2022-09-29 (×2): 100 mg via INTRAVENOUS
  Filled 2022-09-27 (×2): qty 20

## 2022-09-27 NOTE — ED Triage Notes (Signed)
Pt was dx with covid this last Saturday. Pt is a dialysis pt and goes on Tue,Thur,Sat. Pt did complete his tx on Saturday. Pt has a swollen left leg at this time and is concerned for cellulitis.

## 2022-09-27 NOTE — Assessment & Plan Note (Signed)
Compromised patient I favor starting the patient on remdesivir.  Patient is not currently hypoxic therefore does not seem to benefit from dexamethasone.

## 2022-09-27 NOTE — Consult Note (Addendum)
Remdesivir - Pharmacy Brief Note   O:  ALT: 45 CXR: Linear opacity in the right lower lung zone may represent atelectasis or fluid in the fissure SpO2: 94% on 'not documented'   A/P:  Remdesivir 200 mg IVPB once followed by 100 mg IVPB daily x 2 days.   [place] Pharmacist 09/27/2022 8:27 PM

## 2022-09-27 NOTE — Assessment & Plan Note (Signed)
I have requested an evaluation from Dr. [name] in the morning.  I have intermediate concern that patient may be fluid overloaded given his chest x-ray findings and also edema.  Also this could also be due to pneumonia from COVID-19 as well as his chronic lymphedema.  Therefore it is better to have somebody who has known the patient for a longer period of time to evaluate.

## 2022-09-27 NOTE — Consult Note (Addendum)
ANTICOAGULATION CONSULT NOTE - Initial Consult  Pharmacy Consult for warfarin Indication: DVT  Allergies  Allergen Reactions   Lisinopril Swelling    Lips mouth    Furosemide Cough    Light headed, blurry vision, weakness.   Latex    Adhesive [Tape] Itching and Rash    Patient Measurements: Height: '6\' 4"'$  (193 cm) Weight: (!) 180 kg (396 lb 13.3 oz) IBW/kg (Calculated) : 86.8  Vital Signs: Temp: 98.9 F (37.2 C) (01/29 2033) Temp Source: Oral (01/29 2033) BP: 148/81 (01/29 1930) Pulse Rate: 72 (01/29 1930)  Labs: Recent Labs    09/27/22 1427 09/27/22 1707  HGB 11.7*  --   HCT 36.0*  --   PLT 208  --   LABPROT  --  28.6*  INR  --  2.7*  CREATININE 13.69*  --     Estimated Creatinine Clearance: 11.1 mL/min (A) (by C-G formula based on SCr of 13.69 mg/dL (H)).   Medical History: Past Medical History:  Diagnosis Date   Anemia    Chronic kidney disease    14% FUNCTION   Diabetes mellitus without complication (HCC)    Diabetic retinopathy ([place])    Dyspnea    DOE   Dysrhythmia    End stage renal disease ([place])    History of orthopnea    error wrong patietn   Hypercholesteremia    error   Hyperkalemia    error wrong patient   Hypertension    Long-term insulin use (HCC)    Lymphedema of leg    Metabolic encephalopathy    error   Microalbuminuria    MSSA (methicillin susceptible Staphylococcus aureus)    Obesity    PVD (peripheral vascular disease) (HCC)    Respiratory failure (HCC)    Error   Sepsis (HCC)    error   Sleep apnea    no CPAP   UTI (urinary tract infection)    error   Venous ulcer (HCC)    Vitamin D deficiency     Medications:  PTA Warfarin dosing '10mg'$  by mouth daily (last dose was 1/28, yesterday)   Assessment: 52 year old male on warfarin for history of DVT. Currenlty admitted inpatient for cellulitis. Pharmacy consulted for management of warfarin inpatient.  Goal of Therapy:  INR 2-3 Monitor platelets by anticoagulation  protocol: Yes  Date INR Dose 1/29 2.7 10 mg     Plan:  Give '10mg'$  PO x1  Check INR tomorrow morning with AM labs  Darrick Penna 09/27/2022,8:58 PM

## 2022-09-27 NOTE — ED Provider Notes (Signed)
[facility] Provider Note    Event Date/Time   First MD Initiated Contact with Patient 09/27/22 1648     (approximate)   History   Wound Check   HPI  [name] is a 52 y.o. male past medical history of end-stage renal disease on dialysis, diabetes hyperlipidemia peripheral vascular disease lymphedema who presents with concern for cellulitis.  Patient tells me that for about a week he has had left lower extremity redness and swelling and pain.  Has had an ulcer on that leg for some time but seem to be improving prior to the onset of this redness.  Tells me left leg is usually more swollen than the right but it is much more swollen today.  He is also had cough body aches decreased appetite and at dialysis had positive COVID test on Saturday.  Last dialysis was Saturday was full session.  Denies increasing dyspnea from baseline.  Patient tells me that he had to have his graft declogged last week and at that time he had an ultrasound of the lower extremity rule out DVT.  He is on Coumadin, his wife tells me due to clots in his dialysis graft.     Past Medical History:  Diagnosis Date   Anemia    Chronic kidney disease    14% FUNCTION   Diabetes mellitus without complication ([place])    Diabetic retinopathy ([place])    Dyspnea    DOE   Dysrhythmia    End stage renal disease ([place])    History of orthopnea    error wrong patietn   Hypercholesteremia    error   Hyperkalemia    error wrong patient   Hypertension    Long-term insulin use ([place])    Lymphedema of leg    Metabolic encephalopathy    error   Microalbuminuria    MSSA (methicillin susceptible Staphylococcus aureus)    Obesity    PVD (peripheral vascular disease) ([place])    Respiratory failure (HCC)    Error   Sepsis ([place])    error   Sleep apnea    no CPAP   UTI (urinary tract infection)    error   Venous ulcer ([place])    Vitamin D deficiency     Patient Active Problem List   Diagnosis Date  Noted   Low testosterone 02/01/2021   Screening for malignant neoplasm of prostate 01/28/2021   Hyperprolactinemia ([place]) 07/03/2020   Gout 08/16/2019   End-stage renal disease ([place]) 60/63/0160   Complication of vascular access for dialysis 06/07/2019   Diabetic retinopathy of both eyes ([place]) 04/12/2019   Morbid obesity with BMI of 45.0-49.9, adult ([place]) 04/12/2019   AVF (arteriovenous fistula) ([place]) 02/22/2019   Hyperkalemia 02/20/2019   End stage renal disease ([place]) 05/29/2018   Venous ulcer of ankle, left ([place]) 05/29/2018   Lymphedema 11/03/2017   Cellulitis 10/01/2017   Chronic venous insufficiency 10/01/2017   Hypertensive disorder 10/01/2017   Hypertension 09/20/2017   Diabetes mellitus without complication ([place]) 10/93/2355   Swelling of limb 09/20/2017   CKD (chronic kidney disease) stage 5, GFR less than 15 ml/min ([place]) 08/30/2017   PVD (peripheral vascular disease) ([place]) 06/18/2014   Long-term insulin use ([place]) 06/13/2014   Microalbuminuria 03/12/2014   Type 1 diabetes mellitus ([place]) 08/31/2003     Physical Exam  Triage Vital Signs: ED Triage Vitals  Enc Vitals Group     BP 09/27/22 1418 (!) 140/67     Pulse Rate 09/27/22  1418 70     Resp 09/27/22 1425 17     Temp 09/27/22 1418 98.8 F (37.1 C)     Temp src --      SpO2 09/27/22 1418 95 %     Weight 09/27/22 1610 (!) 396 lb 13.3 oz (180 kg)     Height 09/27/22 1610 '6\' 4"'$  (1.93 m)     Head Circumference --      Peak Flow --      Pain Score 09/27/22 1425 9     Pain Loc --      Pain Edu? --      Excl. in [place]? --     Most recent vital signs: Vitals:   09/27/22 1845 09/27/22 1851  BP:  120/66  Pulse: 64   Resp:    Temp:    SpO2: 90%      General: Awake, no distress.  CV:  Good peripheral perfusion.  Resp:  Normal effort.  Abd:  No distention.  Neuro:             Awake, Alert, Oriented x 3  Other:  Left lower extremity significantly swollen and erythematous from toes up to the top of the calf it is  tender or warm no crepitus ulceration on the lateral leg without drainage    ED Results / Procedures / Treatments  Labs (all labs ordered are listed, but only abnormal results are displayed) Labs Reviewed  RESP PANEL BY RT-PCR (RSV, FLU A&B, COVID)  [id number] - Abnormal; Notable for the following components:      Result Value   SARS Coronavirus 2 by RT PCR POSITIVE (*)    All other components within normal limits  CBC - Abnormal; Notable for the following components:   WBC 19.5 (*)    RBC 3.70 (*)    Hemoglobin 11.7 (*)    HCT 36.0 (*)    All other components within normal limits  COMPREHENSIVE METABOLIC PANEL - Abnormal; Notable for the following components:   Sodium 132 (*)    Chloride 89 (*)    Glucose, Bld 213 (*)    BUN 73 (*)    Creatinine, Ser 13.69 (*)    Total Protein 8.6 (*)    Albumin 3.0 (*)    AST 50 (*)    ALT 45 (*)    GFR, Estimated 4 (*)    Anion gap 19 (*)    All other components within normal limits  CULTURE, BLOOD (ROUTINE X 2)  CULTURE, BLOOD (ROUTINE X 2)  LACTIC ACID, PLASMA  PROTIME-INR  LACTIC ACID, PLASMA     EKG     RADIOLOGY I reviewed and interpreted the chest x-ray which shows bronchitic changes   PROCEDURES:  Critical Care performed: Yes, see critical care procedure note(s)  .1-3 Lead EKG Interpretation  Performed by: Rada Hay, MD Authorized by: Rada Hay, MD     Interpretation: normal     ECG rate assessment: normal     Rhythm: sinus rhythm     Ectopy: none     Conduction: normal     The patient is on the cardiac monitor to evaluate for evidence of arrhythmia and/or significant heart rate changes.   MEDICATIONS ORDERED IN ED: Medications  vancomycin (VANCOREADY) IVPB 2000 mg/400 mL (has no administration in time range)  cefTRIAXone (ROCEPHIN) 2 g in sodium chloride 0.9 % 100 mL IVPB (has no administration in time range)     IMPRESSION / MDM / ASSESSMENT  AND PLAN / ED COURSE  I reviewed the  triage vital signs and the nursing notes.                              Patient's presentation is most consistent with acute presentation with potential threat to life or bodily function.  Differential diagnosis includes, but is not limited to, cellulitis, DVT, necrotizing infection, osteomyelitis  Patient is a 52 year old male with multiple medical comorbidities including end-stage renal disease chronic lymphedema who presents because of swelling and pain of the left lower extremity.  He has had swelling redness and pain increasing the left lower extremity for about a week.  Of note he also has viral symptoms including cough decreased appetite some diarrhea earlier in the week and did have positive COVID test on Saturday at dialysis.  He follows with vascular and was seen by Dr. Delana Meyer in the office today who referred him to the emergency department.  Patient has had some subjective fevers no measured fever at home.  Vital signs overall reassuring he is mildly hypertensive.  Satting 90% when he is laying flat on the bed but this improves when I sat him up.  He has no increasing dyspnea from his baseline.  The left lower extremity is quite swollen compared to the right it is tender and there is erythema extending from the foot up to the top of the calf.  There is no crepitus.  There is ulceration the lateral calf looks like it has been present for some time and is not actively draining.  Doses of 19 anion gap of 19 creatinine 13 BUN 73.  Blood sugar just mildly elevated at 200.  Will add blood cultures lactate procalcitonin.  Will cover with ceftriaxone vancomycin.  Patient does tell me he had an ultrasound like last week but we will repeat this specially with his history.  Will also check INR to see if he is therapeutic on his Coumadin.  He will require admission given severity of the what I presume to be cellulitis.  Patient was difficult to obtain IV access on so antibiotics were delayed.  Lactate  negative.  DVT study negative for clot.  Chest x-ray showing bronchitic changes possible pulmonary edema.     Clinical Course as of 09/27/22 1856  Mon Sep 27, 2022  1840 SARS Coronavirus 2 by RT PCR(!): POSITIVE [KM]  1848 Lactic Acid, Venous: 1.5 [KM]    Clinical Course User Index [KM] Rada Hay, MD     FINAL CLINICAL IMPRESSION(S) / ED DIAGNOSES   Final diagnoses:  Cellulitis of left lower extremity     Rx / DC Orders   ED Discharge Orders     None        Note:  This document was prepared using Dragon voice recognition software and may include unintentional dictation errors.   Rada Hay, MD 09/27/22 [id number]

## 2022-09-27 NOTE — Consult Note (Addendum)
Pharmacy Antibiotic Note  [name] is a 52 y.o. male admitted on 09/27/2022 with cellulitis.  Pharmacy has been consulted for vancomycin dosing.  1/29 patient received vancomycin '2000mg'$  IV x1 in ED  Plan: Give vancomycin '500mg'$  IV x1 followed by vancomycin variable dosing regimen given renal failure. Check serum creatinine and random vancomycin level tomorrow morning with AM labs Followup with nephrology on dialysis scheduled for HD dosing Continue to monitor and dose adjust antibiotics according to renal function and indication   Height: '6\' 4"'$  (193 cm) Weight: (!) 180 kg (396 lb 13.3 oz) IBW/kg (Calculated) : 86.8  Temp (24hrs), Avg:98.8 F (37.1 C), Min:98.8 F (37.1 C), Max:98.8 F (37.1 C)  Recent Labs  Lab 09/27/22 1427 09/27/22 1707  WBC 19.5*  --   CREATININE 13.69*  --   LATICACIDVEN  --  1.5    Estimated Creatinine Clearance: 11.1 mL/min (A) (by C-G formula based on SCr of 13.69 mg/dL (H)).    Allergies  Allergen Reactions   Lisinopril Swelling    Lips mouth    Furosemide Cough    Light headed, blurry vision, weakness.   Latex    Adhesive [Tape] Itching and Rash    Antimicrobials this admission: 1/29 vancomycin >>   Microbiology results: 1/29 BCx: sent 1/29 COVID positive   Thank you for allowing pharmacy to be a part of this patient's care.  Darrick Penna 09/27/2022 8:03 PM

## 2022-09-27 NOTE — Assessment & Plan Note (Signed)
Restart insulin

## 2022-09-27 NOTE — H&P (Signed)
History and Physical    Patient: [name] TFT:[id number] DOB: 20-Jul-1971 DOA: 09/27/2022 DOS: the patient was seen and examined on 09/27/2022 PCP: [name], MD  Patient coming from: Home  Chief Complaint:  Chief Complaint  Patient presents with   Wound Check   HPI: [name] is a 52 y.o. male with medical history significant of lymphedema of the left lower extremity, prior remote leg cellulitis.  Patient was in his usual state of health till about 10 days ago or so.  Patient reports a chronic left lower extremity swelling due to his lymphedema.  However is generally will get up and walk around and the swelling is not too bad.  About 10 days ago patient reports new development of "sore "on the left leg in the upper lateral part about 2 cm across around.  Subsequently there was a spreading redness from the site that has since involved the entire lower two thirds of the leg, ankle and the foot.  Patient does not recall any trauma no animal contact no marine encounter.  Patient is not under any treatment as an outpatient for his symptoms.  Patient was evaluated by her outpatient doctor today and advised to come to the ER.  Of note the patient's sore on the left leg does not have any purulent discharge or marked pain.  Patient was also having a cough dry with some sensation of shortness of breath for the last 7 days.  Patient reports on and off sensation of fever no chest pain palpitation or presyncope.  Patient was diagnosed with COVID-19 about 3 days ago.  Subjective fevers are reported not documented.  Patient reports some loose stools and very poor appetite for last 7 days  Review of Systems: As mentioned in the history of present illness. All other systems reviewed and are negative. Past Medical History:  Diagnosis Date   Anemia    Chronic kidney disease    14% FUNCTION   Diabetes mellitus without complication ([place])    Diabetic retinopathy ([place])    Dyspnea    DOE    Dysrhythmia    End stage renal disease ([place])    History of orthopnea    error wrong patietn   Hypercholesteremia    error   Hyperkalemia    error wrong patient   Hypertension    Long-term insulin use ([name])    Lymphedema of leg    Metabolic encephalopathy    error   Microalbuminuria    MSSA (methicillin susceptible Staphylococcus aureus)    Obesity    PVD (peripheral vascular disease) (HCC)    Respiratory failure (HCC)    Error   Sepsis ([place])    error   Sleep apnea    no CPAP   UTI (urinary tract infection)    error   Venous ulcer ([place])    Vitamin D deficiency    Past Surgical History:  Procedure Laterality Date   A/V FISTULAGRAM Right 10/24/2018   Procedure: A/V FISTULAGRAM;  Surgeon: Katha Cabal, MD;  Location: [facility];  Service: Cardiovascular;  Laterality: Right;   A/V FISTULAGRAM Right 11/24/2018   Procedure: A/V FISTULAGRAM;  Surgeon: Katha Cabal, MD;  Location: [facility];  Service: Cardiovascular;  Laterality: Right;   A/V FISTULAGRAM Left 07/31/2019   Procedure: A/V FISTULAGRAM;  Surgeon: Katha Cabal, MD;  Location: [facility];  Service: Cardiovascular;  Laterality: Left;   A/V FISTULAGRAM Left 12/11/2019   Procedure:  A/V FISTULAGRAM;  Surgeon: Katha Cabal, MD;  Location: [facility];  Service: Cardiovascular;  Laterality: Left;   A/V SHUNT INTERVENTION Right 02/21/2019   Procedure: A/V SHUNT INTERVENTION;  Surgeon: Katha Cabal, MD;  Location: [facility];  Service: Cardiovascular;  Laterality: Right;   APPLICATION OF WOUND VAC Left 10/03/2017   Procedure: APPLICATION OF WOUND VAC;  Surgeon: Algernon Huxley, MD;  Location: [facility];  Service: General;  Laterality: Left;   APPLICATION OF WOUND VAC Left 10/11/2017   Procedure: APPLICATION OF WOUND VAC;  Surgeon: Katha Cabal, MD;  Location: [facility];  Service: Vascular;  Laterality: Left;   APPLICATION OF WOUND VAC Left 10/14/2017    Procedure: WOUND VAC CHANGE;  Surgeon: Katha Cabal, MD;  Location: [facility];  Service: Vascular;  Laterality: Left;  left lower leg   APPLICATION OF WOUND VAC Left 10/18/2017   Procedure: WOUND VAC CHANGE;  Surgeon: Katha Cabal, MD;  Location: [facility];  Service: Vascular;  Laterality: Left;   APPLICATION OF WOUND VAC Left 10/07/2017   Procedure: APPLICATION OF WOUND VAC;  Surgeon: Katha Cabal, MD;  Location: [facility];  Service: Vascular;  Laterality: Left;   AV FISTULA INSERTION W/ RF MAGNETIC GUIDANCE Right 07/19/2018   Procedure: AV FISTULA INSERTION W/RF MAGNETIC GUIDANCE;  Surgeon: Katha Cabal, MD;  Location: [facility];  Service: Cardiovascular;  Laterality: Right;   AV FISTULA PLACEMENT Left 05/11/2019   Procedure: ARTERIOVENOUS (AV) FISTULA CREATION (RADIOCEPHALIC );  Surgeon: Katha Cabal, MD;  Location: [facility];  Service: Vascular;  Laterality: Left;   AV FISTULA PLACEMENT Left 02/20/2020   Procedure: INSERTION OF ARTERIOVENOUS (AV) GORE-TEX GRAFT LEFT ARM ( FOREARM LOOP );  Surgeon: Katha Cabal, MD;  Location: [facility];  Service: Vascular;  Laterality: Left;   CATARACT EXTRACTION W/PHACO Left 11/01/2018   Procedure: CATARACT EXTRACTION PHACO AND INTRAOCULAR LENS PLACEMENT (IOC)-LEFT, DIABETIC-INSULIN DEPENDENT;  Surgeon: Eulogio Bear, MD;  Location: [facility];  Service: Ophthalmology;  Laterality: Left;  [place] 00:41.8 CDE 4.61 Fluid Pack Lot # [id number]   CATARACT EXTRACTION W/PHACO Right 04/27/2019   Procedure: CATARACT EXTRACTION PHACO AND INTRAOCULAR LENS PLACEMENT (IOC);  Surgeon: Eulogio Bear, MD;  Location: [facility];  Service: Ophthalmology;  Laterality: Right;  [place] 00:34 CDE 1.97 FLUID PACK LOT # [id number]    COLONOSCOPY WITH PROPOFOL N/A 05/12/2020   Procedure: COLONOSCOPY WITH PROPOFOL;  Surgeon: Tol[place], Benay Pike, MD;  Location: [facility];  Service: Gastroenterology;  Laterality: N/A;   DIALYSIS/PERMA CATHETER INSERTION  N/A 02/23/2019   Procedure: DIALYSIS/PERMA CATHETER INSERTION;  Surgeon: Algernon Huxley, MD;  Location: [facility] [place] [facility] [facility];  Service: Cardiovascular;  Laterality: N/A;   DIALYSIS/PERMA CATHETER REMOVAL N/A 04/03/2020   Procedure: DIALYSIS/PERMA CATHETER REMOVAL;  Surgeon: Katha Cabal, MD;  Location: [facility];  Service: Cardiovascular;  Laterality: N/A;   DIALYSIS/PERMA CATHETER REMOVAL N/A 02/03/2022   Procedure: DIALYSIS/PERMA CATHETER REMOVAL;  Surgeon: Katha Cabal, MD;  Location: [facility];  Service: Cardiovascular;  Laterality: N/A;   I & D EXTREMITY Left 10/11/2017   Procedure: IRRIGATION AND DEBRIDEMENT EXTREMITY;  Surgeon: Katha Cabal, MD;  Location: [facility];  Service: Vascular;  Laterality: Left;   INCISION AND DRAINAGE ABSCESS Right 10/07/2017   Procedure: INCISION AND DRAINAGE ABSCESS;  Surgeon: Katha Cabal, MD;  Location: [facility];  Service: Vascular;  Laterality: Right;   IRRIGATION AND DEBRIDEMENT ABSCESS Left  10/03/2017   Procedure: IRRIGATION AND DEBRIDEMENT ABSCESS with debridement of skin, soft tissue, muscle 50sq cm;  Surgeon: Algernon Huxley, MD;  Location: [facility];  Service: General;  Laterality: Left;   TEMPORARY DIALYSIS CATHETER  02/20/2019   Procedure: TEMPORARY DIALYSIS CATHETER;  Surgeon: Katha Cabal, MD;  Location: [facility];  Service: Cardiovascular;;   UPPER EXTREMITY ANGIOGRAPHY Left 09/18/2019   Procedure: UPPER EXTREMITY ANGIOGRAPHY;  Surgeon: Katha Cabal, MD;  Location: [facility];  Service: Cardiovascular;  Laterality: Left;   Social History:  reports that he has quit smoking. His smoking use included cigars. He has been exposed to tobacco smoke. He quit smokeless tobacco use about 4 years ago. He reports current alcohol use of about 1.0 standard drink of alcohol per week. He reports that he does not currently use drugs.  Allergies  Allergen Reactions   Lisinopril Swelling    Lips mouth     Furosemide Cough    Light headed, blurry vision, weakness.   Latex    Adhesive [Tape] Itching and Rash    Family History  Problem Relation Age of Onset   Diabetes Father    Kidney disease Father    Diabetes Daughter     Prior to Admission medications   Medication Sig Start Date End Date Taking? Authorizing Provider  allopurinol (ZYLOPRIM) 100 MG tablet Take 100 mg by mouth daily.    [provider]  amLODipine (NORVASC) 10 MG tablet Take 5 mg by mouth daily as needed (if bp is 190/90 or higher).     [provider]  amoxicillin-clavulanate (AUGMENTIN) 500-125 MG tablet Take 1 tablet by mouth daily. 01/27/22   [provider]  atenolol (TENORMIN) 50 MG tablet Take 25 mg by mouth daily as needed (if bp is 190/90 or higher).     [provider]  B Complex-C-Folic Acid (RENA-VITE RX) 1 MG TABS Take 1 tablet by mouth 3 (three) times a week.  08/30/19   [provider]  bromocriptine (PARLODEL) 2.5 MG tablet TAKE 1 TABLET BY MOUTH AT BEDTIME. 06/12/21   Renato Shin, MD  calcium acetate (PHOSLO) 667 MG capsule Take 1,334-2,001 mg by mouth See admin instructions. Take 2001 mg by mouth with each meal & take 1334 mg by mouth with each snack. 01/23/19   [provider]  cinacalcet (SENSIPAR) 30 MG tablet Take 30 mg by mouth daily.    [provider]  CINNAMON PO Take 500 mg by mouth 2 (two) times daily.     [provider]  doxycycline (VIBRAMYCIN) 100 MG capsule Take 100 mg by mouth 2 (two) times daily. 01/20/22   [provider]  ibuprofen (ADVIL) 200 MG tablet Take 200 mg by mouth every 6 (six) hours as needed for headache or moderate pain.    [provider]  Insulin Glargine (LANTUS SOLOSTAR) 100 UNIT/ML Solostar Pen Inject 15 Units into the skin at bedtime.     [provider]  lidocaine-prilocaine (EMLA) cream Apply 1 application topically as needed (port access).    [provider]   loratadine (CLARITIN) 10 MG tablet Take 10 mg by mouth daily as needed for allergies.    [provider]  melatonin 5 MG TABS Take 5 mg by mouth at bedtime as needed (sleep).     [provider]  Multiple Vitamin (MULTIVITAMIN WITH MINERALS) TABS tablet Take 1 tablet by mouth 3 (three) times a week. Men's Multivitamin  [provider]  Multiple Vitamins-Minerals (EYE HEALTH PO) Take 1 capsule by mouth daily.     [provider]  mupirocin ointment (BACTROBAN) 2 % Apply topically daily. 01/20/22   [provider]  Polyethyl Glycol-Propyl Glycol (SYSTANE OP) Place 1 drop into both eyes daily as needed (dry eyes).    [provider]  sildenafil (VIAGRA) 100 MG tablet Take 1 tablet (100 mg total) by mouth daily as needed for erectile dysfunction. 06/28/22   Zara Council A, PA-C  traMADol (ULTRAM) 50 MG tablet Take 1 tablet (50 mg total) by mouth at bedtime as needed for moderate pain or severe pain. 08/19/22   Schnier, Dolores Lory, MD  warfarin (COUMADIN) 5 MG tablet Take 10 mg by mouth daily.  01/11/20   [provider]    Physical Exam: Vitals:   09/27/22 1800 09/27/22 1845 09/27/22 1851 09/27/22 1930  BP:   120/66 (!) 148/81  Pulse: 67 64  72  Resp:    20  Temp:      SpO2: 92% 90%  94%  Weight:      Height:       General: Patient appears obese, especially due to his edema of left leg which is very apparent.  Does not appear to be in any distress, not hypoxic Respiratory exam: Bilateral air entry is vesicular no crackles or wheezes are heard Cardiovascular exam S1-S2 normal Abdomen soft nontender Extremities trace edema of the right leg.  Left lower extremity has edema up to the mid thigh area.  It is hard for me to discern how much of this is chronic due to patient's lymphedema versus acute.  There is erythema with induration of skin of the lower two thirds of the left leg as well as the entirety of the foot.  There is an  ulceration on the lateral part of the upper left leg about 2 cm x 2 cm round shape deep to the subepithelial tissue.  With no purulent discharge.  Pink.  Base.  Distal function is intact.  Percussion of the heel and foot ruled out any fracture clinically.  I do not think that the patient has septic arthritis of the ankle.  The rest of the leg is warm and tender.  I do not appreciate any obvious ulcers although there is some subepithelial blistering on the shin  Data Reviewed:     IMPRESSION: 1. Borderline cardiomegaly. 2. Mild diffuse peribronchial and interstitial thickening, may be pulmonary edema or bronchitic. 3. Linear opacity in the right lower lung zone may represent atelectasis or fluid in the fissure.  IMPRESSION: 1. Negative examination for deep venous thrombosis in the bilateral lower extremities. Limited visualization of the left calf veins. 2. Soft tissue edema of the left calf.    Assessment and Plan: * Cellulitis S/p vancomycin and ceftriaxone in the ER.  S/p blood cultures.  Agree with continuing with vancomycin at this time.  Serial exam.  Again this seems to involve the lower two thirds of the leg the ankle and foot on the left side in a circumferential  COVID-19 virus infection Compromised patient I favor starting the patient on remdesivir.  Patient is not currently hypoxic therefore does not seem to benefit from dexamethasone.  CKD (chronic kidney disease) stage 5, GFR less than 15 ml/min (HCC) I have requested an evaluation from Dr. [name] in the morning.  I have intermediate concern that patient may be fluid overloaded given his chest x-ray findings and also edema.  Also this could also be due to pneumonia from COVID-19 as well as his chronic lymphedema.  Therefore it is better to have somebody who has known the patient for a longer period of time to evaluate.  Venous ulcer of ankle, left ([place]) Wound care requested.  Diabetes mellitus without complication  ([place]) Restart insulin      Advance Care Planning:   Code Status: Prior full code  Consults: nephrology consult pending for AM for routine dialysis and input regarding leg edema  Family Communication: wife at bedsdie  Severity of Illness: The appropriate patient status for this patient is INPATIENT. Inpatient status is judged to be reasonable and necessary in order to provide the required intensity of service to ensure the patient's safety. The patient's presenting symptoms, physical exam findings, and initial radiographic and laboratory data in the context of their chronic comorbidities is felt to place them at high risk for further clinical deterioration. Furthermore, it is not anticipated that the patient will be medically stable for discharge from the hospital within 2 midnights of admission.   * I certify that at the point of admission it is my clinical judgment that the patient will require inpatient hospital care spanning beyond 2 midnights from the point of admission due to high intensity of service, high risk for further deterioration and high frequency of surveillance required.*  Author: Gertie Fey, MD 09/27/2022 8:00 PM  For on call review [email or web address].

## 2022-09-27 NOTE — Assessment & Plan Note (Signed)
S/p vancomycin and ceftriaxone in the ER.  S/p blood cultures.  Agree with continuing with vancomycin at this time.  Serial exam.  Again this seems to involve the lower two thirds of the leg the ankle and foot on the left side in a circumferential

## 2022-09-27 NOTE — Assessment & Plan Note (Signed)
Wound care requested.

## 2022-09-28 ENCOUNTER — Encounter: Payer: Self-pay | Admitting: Internal Medicine

## 2022-09-28 DIAGNOSIS — L03116 Cellulitis of left lower limb: Principal | ICD-10-CM

## 2022-09-28 DIAGNOSIS — I872 Venous insufficiency (chronic) (peripheral): Secondary | ICD-10-CM

## 2022-09-28 DIAGNOSIS — N186 End stage renal disease: Secondary | ICD-10-CM | POA: Diagnosis not present

## 2022-09-28 DIAGNOSIS — I83023 Varicose veins of left lower extremity with ulcer of ankle: Secondary | ICD-10-CM

## 2022-09-28 DIAGNOSIS — L97329 Non-pressure chronic ulcer of left ankle with unspecified severity: Secondary | ICD-10-CM

## 2022-09-28 LAB — CBC
HCT: 33 % — ABNORMAL LOW (ref 39.0–52.0)
Hemoglobin: 10.6 g/dL — ABNORMAL LOW (ref 13.0–17.0)
MCH: 30.9 pg (ref 26.0–34.0)
MCHC: 32.1 g/dL (ref 30.0–36.0)
MCV: 96.2 fL (ref 80.0–100.0)
Platelets: 223 10*3/uL (ref 150–400)
RBC: 3.43 MIL/uL — ABNORMAL LOW (ref 4.22–5.81)
RDW: 13.2 % (ref 11.5–15.5)
WBC: 19.6 10*3/uL — ABNORMAL HIGH (ref 4.0–10.5)
nRBC: 0 % (ref 0.0–0.2)

## 2022-09-28 LAB — BASIC METABOLIC PANEL
Anion gap: 15 (ref 5–15)
BUN: 85 mg/dL — ABNORMAL HIGH (ref 6–20)
CO2: 24 mmol/L (ref 22–32)
Calcium: 8.5 mg/dL — ABNORMAL LOW (ref 8.9–10.3)
Chloride: 95 mmol/L — ABNORMAL LOW (ref 98–111)
Creatinine, Ser: 15.07 mg/dL — ABNORMAL HIGH (ref 0.61–1.24)
GFR, Estimated: 3 mL/min — ABNORMAL LOW (ref >60)
Glucose, Bld: 180 mg/dL — ABNORMAL HIGH (ref 70–99)
Potassium: 4.5 mmol/L (ref 3.5–5.1)
Sodium: 134 mmol/L — ABNORMAL LOW (ref 135–145)

## 2022-09-28 LAB — HIV ANTIBODY (ROUTINE TESTING W REFLEX): HIV Screen 4th Generation wRfx: NONREACTIVE

## 2022-09-28 LAB — PHOSPHORUS: Phosphorus: 5.9 mg/dL — ABNORMAL HIGH (ref 2.5–4.6)

## 2022-09-28 LAB — GLUCOSE, CAPILLARY
Glucose-Capillary: 163 mg/dL — ABNORMAL HIGH (ref 70–99)
Glucose-Capillary: 128 mg/dL — ABNORMAL HIGH (ref 70–99)
Glucose-Capillary: 219 mg/dL — ABNORMAL HIGH (ref 70–99)
Glucose-Capillary: 236 mg/dL — ABNORMAL HIGH (ref 70–99)

## 2022-09-28 LAB — HEPATITIS B SURFACE ANTIGEN: Hepatitis B Surface Ag: NONREACTIVE

## 2022-09-28 LAB — HEMOGLOBIN A1C
Hgb A1c MFr Bld: 8.8 % — ABNORMAL HIGH (ref 4.8–5.6)
Mean Plasma Glucose: 205.86 mg/dL

## 2022-09-28 LAB — VANCOMYCIN, RANDOM: Vancomycin Rm: 23 ug/mL

## 2022-09-28 LAB — PROTIME-INR
INR: 3.4 — ABNORMAL HIGH (ref 0.8–1.2)
Prothrombin Time: 33.7 seconds — ABNORMAL HIGH (ref 11.4–15.2)

## 2022-09-28 MED ORDER — LIDOCAINE-PRILOCAINE 2.5-2.5 % EX CREA: 1.0000 | TOPICAL_CREAM | CUTANEOUS | Status: DC | PRN

## 2022-09-28 MED ORDER — ACETAMINOPHEN 650 MG RE SUPP: 650.0000 mg | Freq: Four times a day (QID) | RECTAL | Status: DC | PRN

## 2022-09-28 MED ORDER — TRAMADOL HCL 50 MG PO TABS
50.0000 mg | ORAL_TABLET | Freq: Every evening | ORAL | Status: DC | PRN
  Administered 2022-09-28 (×2): 50 mg via ORAL
  Filled 2022-09-28 (×2): qty 1

## 2022-09-28 MED ORDER — SODIUM CHLORIDE 0.9% FLUSH
3.0000 mL | Freq: Two times a day (BID) | INTRAVENOUS | Status: DC
  Administered 2022-09-28 – 2022-10-05 (×15): 3 mL via INTRAVENOUS

## 2022-09-28 MED ORDER — PENTAFLUOROPROP-TETRAFLUOROETH EX AERO
INHALATION_SPRAY | CUTANEOUS | Status: AC
  Filled 2022-09-28: qty 30

## 2022-09-28 MED ORDER — POLYVINYL ALCOHOL 1.4 % OP SOLN: Freq: Four times a day (QID) | OPHTHALMIC | Status: DC | PRN

## 2022-09-28 MED ORDER — OXYCODONE HCL 5 MG PO TABS
5.0000 mg | ORAL_TABLET | Freq: Three times a day (TID) | ORAL | Status: DC | PRN
  Administered 2022-09-28 – 2022-10-05 (×10): 5 mg via ORAL
  Filled 2022-09-28 (×11): qty 1

## 2022-09-28 MED ORDER — DEXAMETHASONE SODIUM PHOSPHATE 10 MG/ML IJ SOLN
6.0000 mg | INTRAMUSCULAR | Status: DC
  Administered 2022-09-28 – 2022-09-30 (×3): 6 mg via INTRAVENOUS
  Filled 2022-09-28 (×3): qty 1

## 2022-09-28 MED ORDER — CALCIUM ACETATE (PHOS BINDER) 667 MG PO CAPS
2001.0000 mg | ORAL_CAPSULE | Freq: Three times a day (TID) | ORAL | Status: DC
  Administered 2022-09-28 – 2022-10-05 (×19): 2001 mg via ORAL
  Filled 2022-09-28 (×20): qty 3

## 2022-09-28 MED ORDER — VANCOMYCIN HCL IN DEXTROSE 1-5 GM/200ML-% IV SOLN
1000.0000 mg | INTRAVENOUS | Status: AC
  Administered 2022-09-28 – 2022-10-02 (×3): 1000 mg via INTRAVENOUS
  Filled 2022-09-28 (×3): qty 200

## 2022-09-28 MED ORDER — RENA-VITE PO TABS
1.0000 | ORAL_TABLET | ORAL | Status: DC
  Administered 2022-09-29 – 2022-10-04 (×3): 1 via ORAL
  Filled 2022-09-28 (×4): qty 1

## 2022-09-28 MED ORDER — SODIUM CHLORIDE 0.9 % IV SOLN
1.0000 g | INTRAVENOUS | Status: AC
  Administered 2022-09-28 – 2022-10-03 (×6): 1 g via INTRAVENOUS
  Filled 2022-09-28 (×4): qty 10
  Filled 2022-09-28: qty 1
  Filled 2022-09-28: qty 10

## 2022-09-28 MED ORDER — CINACALCET HCL 30 MG PO TABS
30.0000 mg | ORAL_TABLET | Freq: Every day | ORAL | Status: DC
  Administered 2022-09-28 – 2022-10-05 (×8): 30 mg via ORAL
  Filled 2022-09-28 (×8): qty 1

## 2022-09-28 MED ORDER — INSULIN GLARGINE-YFGN 100 UNIT/ML ~~LOC~~ SOLN
15.0000 [IU] | Freq: Every day | SUBCUTANEOUS | Status: DC
  Administered 2022-09-28 (×2): 15 [IU] via SUBCUTANEOUS
  Filled 2022-09-28 (×3): qty 0.15

## 2022-09-28 MED ORDER — MELATONIN 5 MG PO TABS
5.0000 mg | ORAL_TABLET | Freq: Every evening | ORAL | Status: DC | PRN
  Administered 2022-09-29 – 2022-10-04 (×6): 5 mg via ORAL
  Filled 2022-09-28 (×6): qty 1

## 2022-09-28 MED ORDER — ACETAMINOPHEN 325 MG PO TABS
650.0000 mg | ORAL_TABLET | Freq: Four times a day (QID) | ORAL | Status: DC | PRN
  Administered 2022-10-04 (×2): 650 mg via ORAL
  Filled 2022-09-28 (×2): qty 2

## 2022-09-28 MED ORDER — HYDROMORPHONE HCL 1 MG/ML IJ SOLN
0.5000 mg | INTRAMUSCULAR | Status: AC | PRN
  Administered 2022-09-28 (×2): 0.5 mg via INTRAVENOUS
  Filled 2022-09-28 (×2): qty 0.5

## 2022-09-28 MED ORDER — BROMOCRIPTINE MESYLATE 2.5 MG PO TABS
2.5000 mg | ORAL_TABLET | Freq: Every day | ORAL | Status: DC
  Administered 2022-09-28 – 2022-10-04 (×8): 2.5 mg via ORAL
  Filled 2022-09-28 (×8): qty 1

## 2022-09-28 MED ORDER — ATENOLOL 25 MG PO TABS: 25.0000 mg | ORAL_TABLET | Freq: Every day | ORAL | Status: DC | PRN

## 2022-09-28 MED ORDER — CHLORHEXIDINE GLUCONATE CLOTH 2 % EX PADS
6.0000 | MEDICATED_PAD | Freq: Every day | CUTANEOUS | Status: DC
  Administered 2022-09-28 – 2022-10-05 (×8): 6 via TOPICAL

## 2022-09-28 MED ORDER — ALLOPURINOL 100 MG PO TABS
100.0000 mg | ORAL_TABLET | Freq: Every day | ORAL | Status: DC
  Administered 2022-09-28 – 2022-10-05 (×8): 100 mg via ORAL
  Filled 2022-09-28 (×8): qty 1

## 2022-09-28 MED ORDER — AMLODIPINE BESYLATE 5 MG PO TABS: 5.0000 mg | ORAL_TABLET | Freq: Every day | ORAL | Status: DC | PRN

## 2022-09-28 NOTE — Consult Note (Signed)
[facility] for warfarin Indication: DVT  Allergies  Allergen Reactions   Lisinopril Swelling    Lips mouth    Furosemide Cough    Light headed, blurry vision, weakness.   Latex    Adhesive [Tape] Itching and Rash    Patient Measurements: Height: '6\' 4"'$  (193 cm) Weight: (!) 180 kg (396 lb 13.3 oz) IBW/kg (Calculated) : 86.8  Vital Signs: Temp: 98.6 F (37 C) (01/30 0423) Temp Source: Oral (01/30 0423) BP: 132/76 (01/30 0423) Pulse Rate: 67 (01/30 0423)  Labs: Recent Labs    09/27/22 1427 09/27/22 1707 09/28/22 0448  HGB 11.7*  --  10.6*  HCT 36.0*  --  33.0*  PLT 208  --  223  LABPROT  --  28.6* 33.7*  INR  --  2.7* 3.4*  CREATININE 13.69*  --  15.07*     Estimated Creatinine Clearance: 10.1 mL/min (A) (by C-G formula based on SCr of 15.07 mg/dL (H)).   Medical History: Past Medical History:  Diagnosis Date   Anemia    Chronic kidney disease    14% FUNCTION   Diabetes mellitus without complication (HCC)    Diabetic retinopathy ([place])    Dyspnea    DOE   Dysrhythmia    End stage renal disease ([place])    History of orthopnea    error wrong patietn   Hypercholesteremia    error   Hyperkalemia    error wrong patient   Hypertension    Long-term insulin use (HCC)    Lymphedema of leg    Metabolic encephalopathy    error   Microalbuminuria    MSSA (methicillin susceptible Staphylococcus aureus)    Obesity    PVD (peripheral vascular disease) (HCC)    Respiratory failure (HCC)    Error   Sepsis (HCC)    error   Sleep apnea    no CPAP   UTI (urinary tract infection)    error   Venous ulcer (HCC)    Vitamin D deficiency     Medications:  PTA Warfarin dosing '10mg'$  by mouth daily (last dose was 1/28)   Assessment: 52 year old male on warfarin for history of DVT. Currenlty admitted inpatient for cellulitis. Pharmacy consulted for management of warfarin inpatient.  Goal of Therapy:  INR 2-3 Monitor platelets  by anticoagulation protocol: Yes   Plan: INR supartherapeutic ---hold warfarin today ---Check INR tomorrow morning with AM labs  Dallie Piles 09/28/2022,8:15 AM

## 2022-09-28 NOTE — Progress Notes (Signed)
PT did 2.5 hrs of HD, pt wanted to get off, states he feels worn out. AMA signed and in chart.  UF = 2000 ml    09/28/22 1340  Vitals  Temp 98.1 F (36.7 C)  Temp Source Oral  BP 120/60  MAP (mmHg) 75  BP Location Right Arm  BP Method Automatic  Patient Position (if appropriate) Lying  Pulse Rate 62  Pulse Rate Source Dinamap  Resp 15  Oxygen Therapy  SpO2 100 %  O2 Device Nasal Cannula  O2 Flow Rate (L/min) 2 L/min  Post Treatment  Dialyzer Clearance Clear  Duration of HD Treatment -hour(s) 2.5 hour(s)  Hemodialysis Intake (mL) 0 mL  Liters Processed 57.6  Fluid Removed (mL) 2000 mL  Tolerated HD Treatment No (Comment)  Post-Hemodialysis Comments  (PT wanted to get off HD, states he feels worn out, ama signed)  AVG/AVF Arterial Site Held (minutes) 10 minutes  AVG/AVF Venous Site Held (minutes) 10 minutes

## 2022-09-28 NOTE — Discharge Planning (Signed)
ESTABLISHED HEMODIALYSIS [facility]  [place], [place] [address] [phone]   Scheduled days: Monday Wednesday and Friday  Treatment time: 7:00am  Confirmed above schedule with Lucio Edward. Discussed transportation concerns upon discharge.

## 2022-09-28 NOTE — Progress Notes (Signed)
Progress Note    [name]  VQM:[id number] DOB: [birth year]  DOA: 09/27/2022 PCP: [name], MD      Brief Narrative:    Medical records reviewed and are as summarized below:  [name] is a 52 y.o. male with medical history significant for left lower extremity lymphedema, chronic venous insufficiency, remote history of lower extremity cellulitis, PVD, ESRD on hemodialysis, blood clot in dialysis graft on Coumadin, sleep apnea not on CPAP, insulin-dependent diabetes mellitus, hypertension, hypercholesterolemia, chronic anemia, morbid obesity.  He has a left ankle ulcer and he saw Dr. Delana Meyer, vascular surgeon, in the office on 08/19/2022 for this.  Local wound care and tramadol were prescribed at that time.  He noticed increasing pain, redness and swelling in the left leg and foot about a week prior to admission. He went to see Dr. Delana Meyer in the office on 09/26/2022.  He presented to the emergency room for further management.  He also complained of cough, body aches and decreased appetite.  He tested positive for COVID-19 infection on Saturday, 09/25/2022.  He was admitted to the hospital for cellulitis of the left lower extremity.     Assessment/Plan:   Principal Problem:   Cellulitis of left lower extremity Active Problems:   Diabetes mellitus without complication (HCC)   Chronic venous insufficiency   Venous ulcer of ankle, left (HCC)   CKD (chronic kidney disease) stage 5, GFR less than 15 ml/min (HCC)   Morbid obesity with BMI of 45.0-49.9, adult (HCC)   End-stage renal disease ([place])   Diabetic ulcer of lower leg ([place])   COVID-19 virus infection   Body mass index is 47.85 kg/m.  (Morbid obesity)    Cellulitis of left lower extremity, chronic venous insufficiency bilateral legs, left leg lymphedema, left ankle ulcer: Continue IV vancomycin and ceftriaxone.  Analgesics as needed for pain.  Follow-up blood cultures.  Continue local wound  care.   COVID-19 infection: Continue IV remdesivir.  Add IV dexamethasone because of hypoxia   Acute hypoxic respiratory failure: Continue 2 L/min oxygen via [place].  Taper off oxygen as able.  He said he does not use oxygen at home.   Insulin-dependent diabetes mellitus: Continue insulin glargine and use NovoLog as needed for hyperglycemia.   ESRD: He had hemodialysis today.  Follow-up with nephrologist.   PVD, history of DVT, history of clot in AV graft: INR is supratherapeutic at 3.4.  Warfarin has been held.  Follow-up with pharmacist for INR monitoring and warfarin dosing.   Diet Order             Diet Carb Modified Fluid consistency: Thin; Room service appropriate? Yes  Diet effective now                            Consultants: Nephrologist  Procedures: None    Medications:    allopurinol  100 mg Oral Daily   bromocriptine  2.5 mg Oral QHS   calcium acetate  2,001 mg Oral TID WC   Chlorhexidine Gluconate Cloth  6 each Topical Q0600   cinacalcet  30 mg Oral Q breakfast   insulin aspart  0-15 Units Subcutaneous TID WC   insulin aspart  0-5 Units Subcutaneous QHS   insulin glargine-yfgn  15 Units Subcutaneous QHS   [START ON 09/29/2022] multivitamin  1 tablet Oral Once per day on Mon Wed Fri   pentafluoroprop-tetrafluoroeth       sodium  chloride flush  3 mL Intravenous Q12H   Warfarin - Pharmacist Dosing Inpatient   Does not apply q1600   Continuous Infusions:  remdesivir 100 mg in sodium chloride 0.9 % 100 mL IVPB 100 mg (09/28/22 0855)   vancomycin 1,000 mg (09/28/22 1454)     Anti-infectives (From admission, onward)    Start     Dose/Rate Route Frequency Ordered Stop   09/28/22 1200  vancomycin (VANCOCIN) IVPB 1000 mg/200 mL premix        1,000 mg 200 mL/hr over 60 Minutes Intravenous Every T-Th-Sa (Hemodialysis) 09/28/22 0833     09/28/22 1000  remdesivir 100 mg in sodium chloride 0.9 % 100 mL IVPB       See Hyperspace for full Linked  Orders Report.   100 mg 200 mL/hr over 30 Minutes Intravenous Daily 09/27/22 2028 09/30/22 0959   09/27/22 2030  remdesivir 200 mg in sodium chloride 0.9% 250 mL IVPB       See Hyperspace for full Linked Orders Report.   200 mg 580 mL/hr over 30 Minutes Intravenous Once 09/27/22 2028 09/27/22 2335   09/27/22 2015  vancomycin (VANCOREADY) IVPB 500 mg/100 mL        500 mg 100 mL/hr over 60 Minutes Intravenous  Once 09/27/22 2008 09/28/22 0101   09/27/22 2007  vancomycin variable dose per unstable renal function (pharmacist dosing)  Status:  Discontinued         Does not apply See admin instructions 09/27/22 2008 09/28/22 0833   09/27/22 1730  vancomycin (VANCOREADY) IVPB 2000 mg/400 mL        2,000 mg 200 mL/hr over 120 Minutes Intravenous  Once 09/27/22 1707 09/27/22 2313   09/27/22 1715  cefTRIAXone (ROCEPHIN) 2 g in sodium chloride 0.9 % 100 mL IVPB        2 g 200 mL/hr over 30 Minutes Intravenous  Once 09/27/22 1707 09/27/22 1933              Family Communication/Anticipated D/C date and plan/Code Status   DVT prophylaxis:      Code Status: Full Code  Family Communication: Plan discussed with his parents at the bedside Disposition Plan: Plan to discharge home in 2 to 3 days   Status is: Inpatient Remains inpatient appropriate because: On IV antibiotics       Subjective:   Interval events noted.  He complains of pain in the left leg.  His parents were at the bedside.  Objective:    Vitals:   09/28/22 1300 09/28/22 1330 09/28/22 1340 09/28/22 1413  BP: 109/61 109/65 120/60   Pulse: 60 61 62   Resp: '17 17 15   '$ Temp:   98.1 F (36.7 C)   TempSrc:   Oral   SpO2: 99% 100% 100%   Weight:    (!) 178.3 kg  Height:       No data found.   Intake/Output Summary (Last 24 hours) at 09/28/2022 1526 Last data filed at 09/28/2022 1340 Gross per 24 hour  Intake 120 ml  Output 2000 ml  Net -1880 ml   Filed Weights   09/27/22 1610 09/28/22 1056 09/28/22 1413   Weight: (!) 180 kg (!) 180.4 kg (!) 178.3 kg    Exam:   GEN: NAD SKIN: No rash EYES: EOMI ENT: MMM CV: RRR PULM: CTA B ABD: soft, obese, NT, +BS CNS: AAO x 3, non focal EXT: Significant swelling of the left lower extremity up to the thigh.  Erythema of the  left leg and left foot.  Chronic hyperpigmented changes of bilateral legs.      Data Reviewed:   I have personally reviewed following labs and imaging studies:  Labs: Labs show the following:   Basic Metabolic Panel: Recent Labs  Lab 09/27/22 1427 09/28/22 0448  NA 132* 134*  K 4.6 4.5  CL 89* 95*  CO2 24 24  GLUCOSE 213* 180*  BUN 73* 85*  CREATININE 13.69* 15.07*  CALCIUM 9.1 8.5*  PHOS 5.9*  --    GFR Estimated Creatinine Clearance: 10 mL/min (A) (by C-G formula based on SCr of 15.07 mg/dL (H)). Liver Function Tests: Recent Labs  Lab 09/27/22 1427  AST 50*  ALT 45*  ALKPHOS 121  BILITOT 1.2  PROT 8.6*  ALBUMIN 3.0*   No results for input(s): "LIPASE", "AMYLASE" in the last 168 hours. No results for input(s): "AMMONIA" in the last 168 hours. Coagulation profile Recent Labs  Lab 09/27/22 1707 09/28/22 0448  INR 2.7* 3.4*    CBC: Recent Labs  Lab 09/27/22 1427 09/28/22 0448  WBC 19.5* 19.6*  HGB 11.7* 10.6*  HCT 36.0* 33.0*  MCV 97.3 96.2  PLT 208 223   Cardiac Enzymes: No results for input(s): "CKTOTAL", "CKMB", "CKMBINDEX", "TROPONINI" in the last 168 hours. BNP (last 3 results) No results for input(s): "PROBNP" in the last 8760 hours. CBG: Recent Labs  Lab 09/27/22 2243 09/28/22 0835 09/28/22 1317  GLUCAP 140* 163* 128*   D-Dimer: No results for input(s): "DDIMER" in the last 72 hours. Hgb A1c: Recent Labs    09/27/22 2035  HGBA1C 8.8*   Lipid Profile: No results for input(s): "CHOL", "HDL", "LDLCALC", "TRIG", "CHOLHDL", "LDLDIRECT" in the last 72 hours. Thyroid function studies: No results for input(s): "TSH", "T4TOTAL", "T3FREE", "THYROIDAB" in the last 72  hours.  Invalid input(s): "FREET3" Anemia work up: No results for input(s): "VITAMINB12", "FOLATE", "FERRITIN", "TIBC", "IRON", "RETICCTPCT" in the last 72 hours. Sepsis Labs: Recent Labs  Lab 09/27/22 1427 09/27/22 1707 09/28/22 0448  WBC 19.5*  --  19.6*  LATICACIDVEN  --  1.5  --     Microbiology Recent Results (from the past 240 hour(s))  Blood culture (routine x 2)     Status: None (Preliminary result)   Collection Time: 09/27/22  5:07 PM   Specimen: BLOOD  Result Value Ref Range Status   Specimen Description BLOOD RIGHT ANTECUBITAL  Final   Special Requests   Final    BOTTLES DRAWN AEROBIC AND ANAEROBIC Blood Culture results may not be optimal due to an inadequate volume of blood received in culture bottles   Culture   Final    NO GROWTH < 12 HOURS Performed at [facility], [address], [place], [place] [address]    Report Status PENDING  Incomplete  Resp panel by RT-PCR (RSV, Flu A&B, Covid) Anterior Nasal Swab     Status: Abnormal   Collection Time: 09/27/22  5:07 PM   Specimen: Anterior Nasal Swab  Result Value Ref Range Status   SARS Coronavirus 2 by RT PCR POSITIVE (A) NEGATIVE Final    Comment: (NOTE) SARS-CoV-2 target nucleic acids are DETECTED.  The SARS-CoV-2 RNA is generally detectable in upper respiratory specimens during the acute phase of infection. Positive results are indicative of the presence of the identified virus, but do not rule out bacterial infection or co-infection with other pathogens not detected by the test. Clinical correlation with patient history and other diagnostic information is necessary to determine patient infection  status. The expected result is Negative.  Fact Sheet for Patients: EntrepreneurPulse.com.au  Fact Sheet for Healthcare Providers: IncredibleEmployment.be  This test is not yet approved or cleared by the [place] FDA and  has been authorized for detection  and/or diagnosis of SARS-CoV-2 by FDA under an Emergency Use Authorization (EUA).  This EUA will remain in effect (meaning this test can be used) for the duration of  the COVID-19 declaration under Section 564(b)(1) of the A ct, 21 U.S.C. section 360bbb-3(b)(1), unless the authorization is terminated or revoked sooner.     Influenza A by PCR NEGATIVE NEGATIVE Final   Influenza B by PCR NEGATIVE NEGATIVE Final    Comment: (NOTE) The Xpert Xpress SARS-CoV-2/FLU/RSV plus assay is intended as an aid in the diagnosis of influenza from Nasopharyngeal swab specimens and should not be used as a sole basis for treatment. Nasal washings and aspirates are unacceptable for Xpert Xpress SARS-CoV-2/FLU/RSV testing.  Fact Sheet for Patients: EntrepreneurPulse.com.au  Fact Sheet for Healthcare Providers: IncredibleEmployment.be  This test is not yet approved or cleared by the [place] FDA and has been authorized for detection and/or diagnosis of SARS-CoV-2 by FDA under an Emergency Use Authorization (EUA). This EUA will remain in effect (meaning this test can be used) for the duration of the COVID-19 declaration under Section 564(b)(1) of the Act, 21 U.S.C. section 360bbb-3(b)(1), unless the authorization is terminated or revoked.     Resp Syncytial Virus by PCR NEGATIVE NEGATIVE Final    Comment: (NOTE) Fact Sheet for Patients: EntrepreneurPulse.com.au  Fact Sheet for Healthcare Providers: IncredibleEmployment.be  This test is not yet approved or cleared by the [place] FDA and has been authorized for detection and/or diagnosis of SARS-CoV-2 by FDA under an Emergency Use Authorization (EUA). This EUA will remain in effect (meaning this test can be used) for the duration of the COVID-19 declaration under Section 564(b)(1) of the Act, 21 U.S.C. section 360bbb-3(b)(1), unless the authorization is terminated  or revoked.  Performed at [facility], [address] [place], [place] [address]   Blood culture (routine x 2)     Status: None (Preliminary result)   Collection Time: 09/27/22  5:12 PM   Specimen: BLOOD  Result Value Ref Range Status   Specimen Description BLOOD LEFT ANTECUBITAL  Final   Special Requests   Final    BOTTLES DRAWN AEROBIC AND ANAEROBIC Blood Culture results may not be optimal due to an inadequate volume of blood received in culture bottles   Culture   Final    NO GROWTH < 12 HOURS Performed at [facility], [address], [place], [place] [address]    Report Status PENDING  Incomplete    Procedures and diagnostic studies:  US Venous Img Lower Bilateral (DVT)  Result Date: 09/27/2022 CLINICAL DATA:  Left lower extremity swelling for 1 month EXAM: BILATERAL LOWER EXTREMITY VENOUS DOPPLER ULTRASOUND TECHNIQUE: Gray-scale sonography with compression, as well as color and duplex ultrasound, were performed to evaluate the deep venous system(s) from the level of the common femoral vein through the popliteal and proximal calf veins. COMPARISON:  None Available. FINDINGS: VENOUS Normal compressibility of the common femoral, superficial femoral, and popliteal veins, as well as the visualized calf veins. Limited visualization of the left calf veins. Visualized portions of profunda femoral vein and great saphenous vein unremarkable. No filling defects to suggest DVT on grayscale or color Doppler imaging. Doppler waveforms show normal direction of venous flow, normal respiratory plasticity and response to augmentation.  OTHER Soft tissue edema of the left calf. Limitations: none IMPRESSION: 1. Negative examination for deep venous thrombosis in the bilateral lower extremities. Limited visualization of the left calf veins. 2. Soft tissue edema of the left calf. Electronically Signed   By: Delanna Ahmadi M.D.   On: 09/27/2022 18:20   DG Chest Portable 1 View  Result  Date: 09/27/2022 CLINICAL DATA:  Shortness of breath. Cellulitis. EXAM: PORTABLE CHEST 1 VIEW COMPARISON:  10/15/2017 FINDINGS: Borderline cardiomegaly with stable mediastinal contours. Mild diffuse peribronchial and interstitial thickening. Linear opacity in the right lower lung zone may represent atelectasis or fluid in the fissure. No large subpulmonic effusion. No confluent consolidation. No pneumothorax. No acute osseous findings. IMPRESSION: 1. Borderline cardiomegaly. 2. Mild diffuse peribronchial and interstitial thickening, may be pulmonary edema or bronchitic. 3. Linear opacity in the right lower lung zone may represent atelectasis or fluid in the fissure. Electronically Signed   By: Keith Rake M.D.   On: 09/27/2022 17:26               LOS: 1 day   Alister Staver  [facility]   Pager on [email or web address]. If 7PM-7AM, please contact night-coverage at [email or web address]     09/28/2022, 3:26 PM

## 2022-09-28 NOTE — Progress Notes (Signed)
[facility], [place] 09/28/22  Subjective:   LOS: 1  Patient known to our practice from outpatient dialysis.  He was recently diagnosed with COVID-19.  States that he came in because of left leg worsening redness and pain.  He was diagnosed with cellulitis and admitted for IV antibiotics. Last HD was on Saturday.  Objective:  Vital signs in last 24 hours:  Temp:  [98.1 F (36.7 C)-98.9 F (37.2 C)] 98.1 F (36.7 C) (01/30 1340) Pulse Rate:  [60-73] 62 (01/30 1340) Resp:  [15-20] 15 (01/30 1340) BP: (106-154)/(60-103) 120/60 (01/30 1340) SpO2:  [90 %-100 %] 100 % (01/30 1340) Weight:  [178.3 kg-180.4 kg] 178.3 kg (01/30 1413)  Weight change:  Filed Weights   09/27/22 1610 09/28/22 1056 09/28/22 1413  Weight: (!) 180 kg (!) 180.4 kg (!) 178.3 kg    Intake/Output:    Intake/Output Summary (Last 24 hours) at 09/28/2022 1618 Last data filed at 09/28/2022 1340 Gross per 24 hour  Intake 120 ml  Output 2000 ml  Net -1880 ml    Physical Exam: General:  No acute distress, laying in the bed  HEENT  anicteric, moist oral mucous membrane  Pulm/lungs  normal breathing effort, lungs are clear to auscultation  CVS/Heart  regular rhythm, no rub or gallop  Abdomen:   Soft, nontender  Extremities:  ++ peripheral edema, left leg bandaged.  Neurologic:  Alert, oriented, able to follow commands  Skin:  No acute rashes  Dialysis access: Left arm AV graft.    Basic Metabolic Panel:  Recent Labs  Lab 09/27/22 1427 09/28/22 0448  NA 132* 134*  K 4.6 4.5  CL 89* 95*  CO2 24 24  GLUCOSE 213* 180*  BUN 73* 85*  CREATININE 13.69* 15.07*  CALCIUM 9.1 8.5*  PHOS 5.9*  --      CBC: Recent Labs  Lab 09/27/22 1427 09/28/22 0448  WBC 19.5* 19.6*  HGB 11.7* 10.6*  HCT 36.0* 33.0*  MCV 97.3 96.2  PLT 208 223     No results found for: "HEPBSAG", "HEPBSAB", "HEPBIGM"    Microbiology:  Recent Results (from the past 240 hour(s))  Blood culture  (routine x 2)     Status: None (Preliminary result)   Collection Time: 09/27/22  5:07 PM   Specimen: BLOOD  Result Value Ref Range Status   Specimen Description BLOOD RIGHT ANTECUBITAL  Final   Special Requests   Final    BOTTLES DRAWN AEROBIC AND ANAEROBIC Blood Culture results may not be optimal due to an inadequate volume of blood received in culture bottles   Culture   Final    NO GROWTH < 12 HOURS Performed at [facility], [address], [place], [place] [address]    Report Status PENDING  Incomplete  Resp panel by RT-PCR (RSV, Flu A&B, Covid) Anterior Nasal Swab     Status: Abnormal   Collection Time: 09/27/22  5:07 PM   Specimen: Anterior Nasal Swab  Result Value Ref Range Status   SARS Coronavirus 2 by RT PCR POSITIVE (A) NEGATIVE Final    Comment: (NOTE) SARS-CoV-2 target nucleic acids are DETECTED.  The SARS-CoV-2 RNA is generally detectable in upper respiratory specimens during the acute phase of infection. Positive results are indicative of the presence of the identified virus, but do not rule out bacterial infection or co-infection with other pathogens not detected by the test. Clinical correlation with patient history and other diagnostic information is necessary to determine patient infection  status. The expected result is Negative.  Fact Sheet for Patients: EntrepreneurPulse.com.au  Fact Sheet for Healthcare Providers: IncredibleEmployment.be  This test is not yet approved or cleared by the [place] FDA and  has been authorized for detection and/or diagnosis of SARS-CoV-2 by FDA under an Emergency Use Authorization (EUA).  This EUA will remain in effect (meaning this test can be used) for the duration of  the COVID-19 declaration under Section 564(b)(1) of the A ct, 21 U.S.C. section 360bbb-3(b)(1), unless the authorization is terminated or revoked sooner.     Influenza A by PCR NEGATIVE NEGATIVE Final    Influenza B by PCR NEGATIVE NEGATIVE Final    Comment: (NOTE) The Xpert Xpress SARS-CoV-2/FLU/RSV plus assay is intended as an aid in the diagnosis of influenza from Nasopharyngeal swab specimens and should not be used as a sole basis for treatment. Nasal washings and aspirates are unacceptable for Xpert Xpress SARS-CoV-2/FLU/RSV testing.  Fact Sheet for Patients: EntrepreneurPulse.com.au  Fact Sheet for Healthcare Providers: IncredibleEmployment.be  This test is not yet approved or cleared by the [place] FDA and has been authorized for detection and/or diagnosis of SARS-CoV-2 by FDA under an Emergency Use Authorization (EUA). This EUA will remain in effect (meaning this test can be used) for the duration of the COVID-19 declaration under Section 564(b)(1) of the Act, 21 U.S.C. section 360bbb-3(b)(1), unless the authorization is terminated or revoked.     Resp Syncytial Virus by PCR NEGATIVE NEGATIVE Final    Comment: (NOTE) Fact Sheet for Patients: EntrepreneurPulse.com.au  Fact Sheet for Healthcare Providers: IncredibleEmployment.be  This test is not yet approved or cleared by the [place] FDA and has been authorized for detection and/or diagnosis of SARS-CoV-2 by FDA under an Emergency Use Authorization (EUA). This EUA will remain in effect (meaning this test can be used) for the duration of the COVID-19 declaration under Section 564(b)(1) of the Act, 21 U.S.C. section 360bbb-3(b)(1), unless the authorization is terminated or revoked.  Performed at [facility], [address] [place], [place] [address]   Blood culture (routine x 2)     Status: None (Preliminary result)   Collection Time: 09/27/22  5:12 PM   Specimen: BLOOD  Result Value Ref Range Status   Specimen Description BLOOD LEFT ANTECUBITAL  Final   Special Requests   Final    BOTTLES DRAWN AEROBIC AND ANAEROBIC  Blood Culture results may not be optimal due to an inadequate volume of blood received in culture bottles   Culture   Final    NO GROWTH < 12 HOURS Performed at [facility], [address] [place], [place] [address]    Report Status PENDING  Incomplete    Coagulation Studies: Recent Labs    09/27/22 1707 09/28/22 0448  LABPROT 28.6* 33.7*  INR 2.7* 3.4*    Urinalysis: No results for input(s): "COLORURINE", "LABSPEC", "PHURINE", "GLUCOSEU", "HGBUR", "BILIRUBINUR", "KETONESUR", "PROTEINUR", "UROBILINOGEN", "NITRITE", "LEUKOCYTESUR" in the last 72 hours.  Invalid input(s): "APPERANCEUR"    Imaging: US Venous Img Lower Bilateral (DVT)  Result Date: 09/27/2022 CLINICAL DATA:  Left lower extremity swelling for 1 month EXAM: BILATERAL LOWER EXTREMITY VENOUS DOPPLER ULTRASOUND TECHNIQUE: Gray-scale sonography with compression, as well as color and duplex ultrasound, were performed to evaluate the deep venous system(s) from the level of the common femoral vein through the popliteal and proximal calf veins. COMPARISON:  None Available. FINDINGS: VENOUS Normal compressibility of the common femoral, superficial femoral, and popliteal veins, as well as the visualized calf  veins. Limited visualization of the left calf veins. Visualized portions of profunda femoral vein and great saphenous vein unremarkable. No filling defects to suggest DVT on grayscale or color Doppler imaging. Doppler waveforms show normal direction of venous flow, normal respiratory plasticity and response to augmentation. OTHER Soft tissue edema of the left calf. Limitations: none IMPRESSION: 1. Negative examination for deep venous thrombosis in the bilateral lower extremities. Limited visualization of the left calf veins. 2. Soft tissue edema of the left calf. Electronically Signed   By: Delanna Ahmadi M.D.   On: 09/27/2022 18:20   DG Chest Portable 1 View  Result Date: 09/27/2022 CLINICAL DATA:  Shortness of breath.  Cellulitis. EXAM: PORTABLE CHEST 1 VIEW COMPARISON:  10/15/2017 FINDINGS: Borderline cardiomegaly with stable mediastinal contours. Mild diffuse peribronchial and interstitial thickening. Linear opacity in the right lower lung zone may represent atelectasis or fluid in the fissure. No large subpulmonic effusion. No confluent consolidation. No pneumothorax. No acute osseous findings. IMPRESSION: 1. Borderline cardiomegaly. 2. Mild diffuse peribronchial and interstitial thickening, may be pulmonary edema or bronchitic. 3. Linear opacity in the right lower lung zone may represent atelectasis or fluid in the fissure. Electronically Signed   By: Keith Rake M.D.   On: 09/27/2022 17:26     Medications:    cefTRIAXone (ROCEPHIN)  IV     remdesivir 100 mg in sodium chloride 0.9 % 100 mL IVPB 100 mg (09/28/22 0855)   vancomycin 1,000 mg (09/28/22 1454)    allopurinol  100 mg Oral Daily   bromocriptine  2.5 mg Oral QHS   calcium acetate  2,001 mg Oral TID WC   Chlorhexidine Gluconate Cloth  6 each Topical Q0600   cinacalcet  30 mg Oral Q breakfast   dexamethasone (DECADRON) injection  6 mg Intravenous Q24H   insulin aspart  0-15 Units Subcutaneous TID WC   insulin aspart  0-5 Units Subcutaneous QHS   insulin glargine-yfgn  15 Units Subcutaneous QHS   [START ON 09/29/2022] multivitamin  1 tablet Oral Once per day on Mon Wed Fri   pentafluoroprop-tetrafluoroeth       sodium chloride flush  3 mL Intravenous Q12H   Warfarin - Pharmacist Dosing Inpatient   Does not apply q1600   acetaminophen **OR** acetaminophen, amLODipine, atenolol, lidocaine-prilocaine, melatonin, oxyCODONE, pentafluoroprop-tetrafluoroeth, polyvinyl alcohol  Assessment/ Plan:  52 y.o. male with end-stage renal disease on hemodialysis, obstructive sleep apnea, insulin-dependent diabetes, hypertension, hyperlipidemia, morbid obesity, left lower extremity lymphedema, chronic venous insufficiency was admitted on 09/27/2022  for  Principal Problem:   Cellulitis of left lower extremity Active Problems:   Diabetes mellitus without complication (HCC)   Chronic venous insufficiency   Venous ulcer of ankle, left (HCC)   CKD (chronic kidney disease) stage 5, GFR less than 15 ml/min (HCC)   Morbid obesity with BMI of 45.0-49.9, adult (HCC)   End-stage renal disease ([place])   Diabetic ulcer of lower leg ([place])   COVID-19 virus infection  Cellulitis [L03.90] Cellulitis of left lower extremity [L03.116] Pneumonia due to COVID-19 virus [U07.1, J12.82]  #. ESRD [place] dialysis/TTS 1/ EDW 180 kg Plan to continue TTS schedule while in the hospital. Patient underwent dialysis today.  Tolerated well.  2000 cc fluid removed.   #. Anemia of CKD  Lab Results  Component Value Date   HGB 10.6 (L) 09/28/2022   Low dose EPO with HD for hemoglobin less than 10.  #. Secondary hyperparathyroidism of renal origin N 25.81   No results  found for: "PTH" Lab Results  Component Value Date   PHOS 5.9 (H) 09/27/2022   Monitor calcium and phos level during this admission   #. Diabetes type 2 with CKD Hgb A1c MFr Bld (%)  Date Value  09/27/2022 8.8 (H)    #Left leg cellulitis, with underlying lymphedema. Currently getting treatment with vancomycin and Rocephin.  Covid -19 Remdesivir as per hospitalist team      LOS: [place] 1/30/20244:18 PM  [place], [place]

## 2022-09-28 NOTE — Consult Note (Signed)
[facility] Nurse Consult Note: Reason for Consult:Patient with LLE lymphedema and cellulitis. Saw Dr. Eloise Levels (Vascular Surgery) yesterday in the office for follow up on a VLU, he noted the area to be dry and healing. Patient is admitted with Covid. Wound type:venous insufficiency Pressure Injury POA: N/A Measurement:To be obtained by Bedside RN and documented on Nursing Flow Sheet Wound bed:dry Drainage (amount, consistency, odor) None Periwound:edema consistent with lymphedema Dressing procedure/placement/frequency:I have provided Nursing with guidance for the topical care of this wound using a daily cleanse, cover with antimicrobial nonadherent (xeroform) and securement with Kerlix roll gauze topped with a 6-inch ACE bandage applied from toes to knee. Heel is to be floated using the Prevalon pressure redistribution boot(s).  Recommend that patient follow up as directed with Vascular Surgery post discharge.  [place] nursing team will not follow, but will remain available to this patient, the nursing and medical teams.  Please re-consult if needed.  Thank you for inviting [place] to participate in this patient's Plan of Care.  [name], MSN, RN, CNS, [place], Serita Grammes, [employer], [employer] phone:  [phone]

## 2022-09-28 NOTE — TOC Initial Note (Signed)
Transition of Care [facility]) - Initial/Assessment Note    Patient Details  Name: [name] MRN: [id number] Date of Birth: October 21, 1970  Transition of [facility]) CM/SW Contact:    Beverly Sessions, RN Phone Number: 09/28/2022, 2:39 PM  Clinical Narrative:                   Transition of Care (TOC) Screening Note   Patient Details  Name: [name] Date of Birth: 1971/02/14   Transition of Care [facility] CM/SW Contact:    Beverly Sessions, RN Phone Number: 09/28/2022, 2:39 PM    Transition of Care Department [facility]) has reviewed patient and no TOC needs have been identified at this time. We will continue to monitor patient advancement through interdisciplinary progression rounds. If new patient transition needs arise, please place a TOC consult.  On 2L acute O2 with plans to wean prior to discharge Elvera Bicker dialysis liaison notified of admission.          Patient Goals and CMS Choice            Expected Discharge Plan and Services                                              Prior Living Arrangements/Services                       Activities of Daily Living Home Assistive Devices/Equipment: None ADL Screening (condition at time of admission) Patient's cognitive ability adequate to safely complete daily activities?: No Is the patient deaf or have difficulty hearing?: No Does the patient have difficulty seeing, even when wearing glasses/contacts?: No Does the patient have difficulty concentrating, remembering, or making decisions?: No Patient able to express need for assistance with ADLs?: No Does the patient have difficulty dressing or bathing?: No Independently performs ADLs?: Yes (appropriate for developmental age) Does the patient have difficulty walking or climbing stairs?: No Weakness of Legs: None Weakness of Arms/Hands: None  Permission Sought/Granted                  Emotional Assessment              Admission  diagnosis:  Cellulitis [L03.90] Cellulitis of left lower extremity [L03.116] Pneumonia due to COVID-19 virus [U07.1, J12.82] Patient Active Problem List   Diagnosis Date Noted   Diabetic ulcer of lower leg ([place]) 09/27/2022   COVID-19 virus infection 09/27/2022   Low testosterone 02/01/2021   Screening for malignant neoplasm of prostate 01/28/2021   Hyperprolactinemia ([place]) 07/03/2020   Gout 08/16/2019   End-stage renal disease ([place]) 97/09/6376   Complication of vascular access for dialysis 06/07/2019   Diabetic retinopathy of both eyes ([place]) 04/12/2019   Morbid obesity with BMI of 45.0-49.9, adult ([place]) 04/12/2019   AVF (arteriovenous fistula) ([place]) 02/22/2019   Hyperkalemia 02/20/2019   End stage renal disease ([place]) 05/29/2018   Venous ulcer of ankle, left ([place]) 05/29/2018   Lymphedema 11/03/2017   Cellulitis 10/01/2017   Chronic venous insufficiency 10/01/2017   Hypertensive disorder 10/01/2017   Hypertension 09/20/2017   Diabetes mellitus without complication ([place]) 58/85/0277   Swelling of limb 09/20/2017   CKD (chronic kidney disease) stage 5, GFR less than 15 ml/min ([place]) 08/30/2017   PVD (peripheral vascular disease) ([place]) 06/18/2014   Long-term insulin use ([place]) 06/13/2014   Microalbuminuria  03/12/2014   Type 1 diabetes mellitus ([place]) 08/31/2003   PCP:  [name], MD Pharmacy:   [id number] [place] [place] [address]Phone: [phone]: [address]Phone: [phone]: [phone] [facility] [address]254[address]06[address] [place]- [address]Phone: [phone]: [phone] [address]Phone: [phone]:  [phone] [address]), [place] - [address] [address]Phone: [phone]: [address] [address]Phone: [phone]: [phone] [id number] [place], [address] [place]one: [phone]: [phone]   Social Determinants of Health (SDOH) Social History: SDOH Screenings   Food Insecurity: No Food Insecurity (09/28/2022)  Housing: Low Risk  (09/28/2022)  Transportation Needs: No Transportation Needs (09/28/2022)  Utilities: Not At Risk (09/28/2022)  Tobacco Use: Medium Risk (09/28/2022)   SDOH Interventions:     Readmission Risk Interventions     No data to display

## 2022-09-28 NOTE — Consult Note (Signed)
Pharmacy Antibiotic Note  [name] is a 52 y.o. male admitted on 09/27/2022 with cellulitis. PMH significant for T1DM, ESRD on HD TTS, HTN, PVD, history of DVT on warfarin, lymphedema. Pharmacy has been consulted for vancomycin dosing.  Plan: Day 2 of antibiotics Give vancomycin 1000 mg IV QHD (TTS). Goal trough 15-25 mcg/mL Patient is also on remdesivir 100 mg IV Q24H Continue to monitor renal function and follow culture results   Height: '6\' 4"'$  (193 cm) Weight: (!) 180 kg (396 lb 13.3 oz) IBW/kg (Calculated) : 86.8  Temp (24hrs), Avg:98.8 F (37.1 C), Min:98.6 F (37 C), Max:98.9 F (37.2 C)  Recent Labs  Lab 09/27/22 1427 09/27/22 1707 09/28/22 0448  WBC 19.5*  --  19.6*  CREATININE 13.69*  --  15.07*  LATICACIDVEN  --  1.5  --   VANCORANDOM  --   --  23     Estimated Creatinine Clearance: 10.1 mL/min (A) (by C-G formula based on SCr of 15.07 mg/dL (H)).    Allergies  Allergen Reactions   Lisinopril Swelling    Lips mouth    Furosemide Cough    Light headed, blurry vision, weakness.   Latex    Adhesive [Tape] Itching and Rash   Antimicrobials this admission: 1/29 Vancomycin >>   Microbiology results: 1/29 BCx: NG<12H 1/29 COVID PCR: positive   Thank you for allowing pharmacy to be a part of this patient's care.  Gretel Acre, PharmD PGY1 Pharmacy Resident 09/28/2022 7:29 AM

## 2022-09-29 DIAGNOSIS — L03116 Cellulitis of left lower limb: Secondary | ICD-10-CM | POA: Diagnosis not present

## 2022-09-29 DIAGNOSIS — U071 COVID-19: Secondary | ICD-10-CM | POA: Diagnosis not present

## 2022-09-29 DIAGNOSIS — N186 End stage renal disease: Secondary | ICD-10-CM | POA: Diagnosis not present

## 2022-09-29 DIAGNOSIS — Z992 Dependence on renal dialysis: Secondary | ICD-10-CM | POA: Diagnosis not present

## 2022-09-29 LAB — CBC WITH DIFFERENTIAL/PLATELET
Abs Immature Granulocytes: 0.37 10*3/uL — ABNORMAL HIGH (ref 0.00–0.07)
Basophils Absolute: 0.1 10*3/uL (ref 0.0–0.1)
Basophils Relative: 0 %
Eosinophils Absolute: 0 10*3/uL (ref 0.0–0.5)
Eosinophils Relative: 0 %
HCT: 35.8 % — ABNORMAL LOW (ref 39.0–52.0)
Hemoglobin: 11.8 g/dL — ABNORMAL LOW (ref 13.0–17.0)
Immature Granulocytes: 2 %
Lymphocytes Relative: 3 %
Lymphs Abs: 0.6 10*3/uL — ABNORMAL LOW (ref 0.7–4.0)
MCH: 31.4 pg (ref 26.0–34.0)
MCHC: 33 g/dL (ref 30.0–36.0)
MCV: 95.2 fL (ref 80.0–100.0)
Monocytes Absolute: 0.4 10*3/uL (ref 0.1–1.0)
Monocytes Relative: 1 %
Neutro Abs: 23.6 10*3/uL — ABNORMAL HIGH (ref 1.7–7.7)
Neutrophils Relative %: 94 %
Platelets: 275 10*3/uL (ref 150–400)
RBC: 3.76 MIL/uL — ABNORMAL LOW (ref 4.22–5.81)
RDW: 13.5 % (ref 11.5–15.5)
WBC: 25 10*3/uL — ABNORMAL HIGH (ref 4.0–10.5)
nRBC: 0 % (ref 0.0–0.2)

## 2022-09-29 LAB — GLUCOSE, CAPILLARY
Glucose-Capillary: 250 mg/dL — ABNORMAL HIGH (ref 70–99)
Glucose-Capillary: 298 mg/dL — ABNORMAL HIGH (ref 70–99)
Glucose-Capillary: 297 mg/dL — ABNORMAL HIGH (ref 70–99)
Glucose-Capillary: 267 mg/dL — ABNORMAL HIGH (ref 70–99)

## 2022-09-29 LAB — COMPREHENSIVE METABOLIC PANEL
ALT: 33 U/L (ref 0–44)
AST: 26 U/L (ref 15–41)
Albumin: 2.4 g/dL — ABNORMAL LOW (ref 3.5–5.0)
Alkaline Phosphatase: 107 U/L (ref 38–126)
Anion gap: 21 — ABNORMAL HIGH (ref 5–15)
BUN: 79 mg/dL — ABNORMAL HIGH (ref 6–20)
CO2: 21 mmol/L — ABNORMAL LOW (ref 22–32)
Calcium: 9.1 mg/dL (ref 8.9–10.3)
Chloride: 93 mmol/L — ABNORMAL LOW (ref 98–111)
Creatinine, Ser: 13.19 mg/dL — ABNORMAL HIGH (ref 0.61–1.24)
GFR, Estimated: 4 mL/min — ABNORMAL LOW (ref >60)
Glucose, Bld: 229 mg/dL — ABNORMAL HIGH (ref 70–99)
Potassium: 5.4 mmol/L — ABNORMAL HIGH (ref 3.5–5.1)
Sodium: 135 mmol/L (ref 135–145)
Total Bilirubin: 1 mg/dL (ref 0.3–1.2)
Total Protein: 7.5 g/dL (ref 6.5–8.1)

## 2022-09-29 LAB — C-REACTIVE PROTEIN: CRP: 37.9 mg/dL — ABNORMAL HIGH (ref <1.0)

## 2022-09-29 LAB — PROTIME-INR
INR: 4.1 (ref 0.8–1.2)
Prothrombin Time: 39.4 seconds — ABNORMAL HIGH (ref 11.4–15.2)

## 2022-09-29 LAB — SEDIMENTATION RATE: Sed Rate: 106 mm/hr — ABNORMAL HIGH (ref 0–16)

## 2022-09-29 LAB — HEPATITIS B SURFACE ANTIBODY, QUANTITATIVE: Hep B S AB Quant (Post): 68.3 m[IU]/mL (ref >9.9)

## 2022-09-29 MED ORDER — INSULIN GLARGINE-YFGN 100 UNIT/ML ~~LOC~~ SOLN
20.0000 [IU] | Freq: Every day | SUBCUTANEOUS | Status: DC
  Administered 2022-09-29: 20 [IU] via SUBCUTANEOUS
  Filled 2022-09-29 (×2): qty 0.2

## 2022-09-29 NOTE — Plan of Care (Signed)
  Problem: Education: Goal: Knowledge of General Education information will improve Description: Including pain rating scale, medication(s)/side effects and non-pharmacologic comfort measures Outcome: Progressing   Problem: Clinical Measurements: Goal: Will remain free from infection Outcome: Progressing   Problem: Clinical Measurements: Goal: Respiratory complications will improve Outcome: Progressing   Problem: Clinical Measurements: Goal: Cardiovascular complication will be avoided Outcome: Progressing   Problem: Pain Managment: Goal: General experience of comfort will improve Outcome: Progressing

## 2022-09-29 NOTE — Consult Note (Incomplete)
ANTICOAGULATION CONSULT NOTE - Follow Up Consult  Pharmacy Consult for Warfarin Indication: DVT  Allergies  Allergen Reactions   Lisinopril Swelling    Lips mouth    Furosemide Cough    Light headed, blurry vision, weakness.   Latex    Adhesive [Tape] Itching and Rash    Patient Measurements: Height: '6\' 4"'$  (193 cm) Weight: (!) 178.3 kg (393 lb 1.3 oz) IBW/kg (Calculated) : 86.8   Vital Signs: Temp: 97.7 F (36.5 C) (01/31 0748) Temp Source: Oral (01/31 0748) BP: 113/65 (01/31 0748) Pulse Rate: 51 (01/31 0748)  Labs: Recent Labs    09/27/22 1427 09/27/22 1707 09/28/22 0448 09/29/22 0608  HGB 11.7*  --  10.6* 11.8*  HCT 36.0*  --  33.0* 35.8*  PLT 208  --  223 275  LABPROT  --  28.6* 33.7* 39.4*  INR  --  2.7* 3.4* 4.1*  CREATININE 13.69*  --  15.07* 13.19*    Estimated Creatinine Clearance: 11.4 mL/min (A) (by C-G formula based on SCr of 13.19 mg/dL (H)).   Medications:  Facility-Administered Medications Prior to Admission  Medication Dose Route Frequency Provider Last Rate Last Admin   [DISCONTINUED] 0.9 %  sodium chloride infusion   Intravenous PRN Causey, Charlestine Massed, NP       Medications Prior to Admission  Medication Sig Dispense Refill Last Dose   allopurinol (ZYLOPRIM) 100 MG tablet Take 100 mg by mouth daily.   09/26/2022   B Complex-C-Folic Acid (RENA-VITE RX) 1 MG TABS Take 1 tablet by mouth 3 (three) times a week.    Past Week   bromocriptine (PARLODEL) 2.5 MG tablet TAKE 1 TABLET BY MOUTH AT BEDTIME. 90 tablet 3 09/26/2022   calcium acetate (PHOSLO) 667 MG capsule Take 1,334-2,001 mg by mouth See admin instructions. Take 2001 mg by mouth with each meal & take 1334 mg by mouth with each snack.   09/26/2022   cinacalcet (SENSIPAR) 30 MG tablet Take 30 mg by mouth daily.   09/26/2022   CINNAMON PO Take 500 mg by mouth 2 (two) times daily.    09/26/2022   Insulin Glargine (LANTUS SOLOSTAR) 100 UNIT/ML Solostar Pen Inject 15 Units into the skin at  bedtime.    09/26/2022   Multiple Vitamin (MULTIVITAMIN WITH MINERALS) TABS tablet Take 1 tablet by mouth 3 (three) times a week. Men's Multivitamin   Past Week   Multiple Vitamins-Minerals (EYE HEALTH PO) Take 1 capsule by mouth daily.    09/26/2022   warfarin (COUMADIN) 5 MG tablet Take 10 mg by mouth daily.    09/26/2022   amLODipine (NORVASC) 10 MG tablet Take 5 mg by mouth daily as needed (if bp is 190/90 or higher).    prn at prn   atenolol (TENORMIN) 50 MG tablet Take 25 mg by mouth daily as needed (if bp is 190/90 or higher).    prn at prn   ibuprofen (ADVIL) 200 MG tablet Take 200 mg by mouth every 6 (six) hours as needed for headache or moderate pain.   prn at prn   lidocaine-prilocaine (EMLA) cream Apply 1 application topically as needed (port access).   prn at prn   loratadine (CLARITIN) 10 MG tablet Take 10 mg by mouth daily as needed for allergies.   prn at prn   melatonin 5 MG TABS Take 5 mg by mouth at bedtime as needed (sleep).    prn at prn   Polyethyl Glycol-Propyl Glycol (SYSTANE OP) Place 1 drop into both  eyes daily as needed (dry eyes).      sildenafil (VIAGRA) 100 MG tablet Take 1 tablet (100 mg total) by mouth daily as needed for erectile dysfunction. 30 tablet 3 prn at prn   traMADol (ULTRAM) 50 MG tablet Take 1 tablet (50 mg total) by mouth at bedtime as needed for moderate pain or severe pain. 25 tablet 0 prn at prn    Assessment: [name] is a 52 YO M with a PMH of ESRD on dialysis, DM, HLN, and lymphedema of the left LE with prior remote leg cellulitis. Was diagnosed to have progressive cellulitis, DVT, necrotizing infection, osteomyelitis and positive for COVID. [name] was on warfarin previously for blood clots during dialysis treatment.   Goal of Therapy:  INR 2-3 Monitor platelets by anticoagulation protocol: Yes   Plan:  1/29: '10mg'$  PO x 1  1/30: HOLD dose  1/31: INR: 3.4>4.1. Decrease to warfarin 5 mg daily. Check INR with am labs   [name], Pharmacy Student   09/29/2022,10:06 AM

## 2022-09-29 NOTE — Inpatient Diabetes Management (Signed)
Inpatient Diabetes Program Recommendations  AACE/ADA: New Consensus Statement on Inpatient Glycemic Control   Target Ranges:  Prepandial:   less than 140 mg/dL      Peak postprandial:   less than 180 mg/dL (1-2 hours)      Critically ill patients:  140 - 180 mg/dL    Latest Reference Range & Units 09/28/22 08:35 09/28/22 13:17 09/28/22 18:26 09/28/22 20:17 09/29/22 07:44  Glucose-Capillary 70 - 99 mg/dL 163 (H) 128 (H) 219 (H) 236 (H) 250 (H)   Review of Glycemic Control  Diabetes history: DM2 Outpatient Diabetes medications: Lantus 15 units QHS Current orders for Inpatient glycemic control: Semglee 15 units QHS, Novolog 0-15 units TID with meals, Novolog 0-5 units QHS; Decadron 4 mg Q24H  Inpatient Diabetes Program Recommendations:    Insulin: If steroids are continued, please consider increasing Semglee to 18 units QHS and ordering Novolog 3 units TID with meals for meal coverage if patient eats at least 50% of meals.  Thanks, Barnie Alderman, RN, MSN, [place] Diabetes Coordinator Inpatient Diabetes Program [phone] (Team Pager from 8am to [place])

## 2022-09-29 NOTE — Evaluation (Signed)
Occupational Therapy Evaluation Patient Details Name: [name] MRN: [id number] DOB: Nov 15, 1970 Today's Date: 09/29/2022   History of Present Illness [name] is a 52 y.o. male past medical history of end-stage renal disease on dialysis, diabetes hyperlipidemia peripheral vascular disease lymphedema who presents with concern for cellulitis.  Patient tells me that for about a week he has had left lower extremity redness and swelling and pain.  Has had an ulcer on that leg for some time but seem to be improving prior to the onset of this redness.  Tells me left leg is usually more swollen than the right but it is much more swollen today.  He is also had cough body aches decreased appetite and at dialysis had positive COVID test on Saturday.  Last dialysis was Saturday was full session.  Denies increasing dyspnea from baseline.   Clinical Impression   Patient presenting with decreased Ind in self care,balance, functional mobility/transfers, endurance, and safety awareness. Patient reports being Ind at baseline without use of AD. Significant other available to assist if needed at discharge. Pt works full time at night and takes himself to HD on T, TH, Sat morning. RN arrives to room with velcro shoe for L foot. However, when placed on foot pt begins to slide all over floor and has to have foot blocked for safety. Shoe was removed to decrease fall safety and RN notified of concerns. Patient currently functioning at min guard for sit <>stand and supervision for ambulation with bariatric RW.  Patient will benefit from acute OT to increase overall independence in the areas of ADLs, functional mobility, and safety awareness in order to safely discharge home.      Recommendations for follow up therapy are one component of a multi-disciplinary discharge planning process, led by the attending physician.  Recommendations may be updated based on patient status, additional functional criteria and insurance  authorization.   Follow Up Recommendations  Home health OT     Assistance Recommended at Discharge Intermittent Supervision/Assistance  Patient can return home with the following A little help with walking and/or transfers;A little help with bathing/dressing/bathroom;Help with stairs or ramp for entrance;Assistance with cooking/housework;Assist for transportation    Functional Status Assessment  Patient has had a recent decline in their functional status and demonstrates the ability to make significant improvements in function in a reasonable and predictable amount of time.  Equipment Recommendations  None recommended by OT       Precautions / Restrictions Precautions Precautions: Fall Precaution Comments: BRW Restrictions Weight Bearing Restrictions: No      Mobility Bed Mobility Overal bed mobility: Needs Assistance Bed Mobility: Supine to Sit     Supine to sit: HOB elevated     General bed mobility comments: increased time    Transfers Overall transfer level: Needs assistance Equipment used: Rolling walker (2 wheels) Transfers: Sit to/from Stand Sit to Stand: Min guard                  Balance Overall balance assessment: Needs assistance Sitting-balance support: Bilateral upper extremity supported, Feet supported Sitting balance-Leahy Scale: Good     Standing balance support: Reliant on assistive device for balance Standing balance-Leahy Scale: Fair Standing balance comment: without UE on RW                           ADL either performed or assessed with clinical judgement   ADL Overall ADL's : Needs assistance/impaired  Lower Body Dressing: Minimal assistance;Sit to/from stand   Toilet Transfer: Rolling walker (2 wheels);Supervision/safety;Min guard Armed forces technical officer Details (indicate cue type and reason): simulated         Functional mobility during ADLs: Supervision/safety;Rolling walker (2 wheels)        Vision Patient Visual Report: No change from baseline              Pertinent Vitals/Pain Pain Assessment Pain Assessment: No/denies pain     Hand Dominance Left   Extremity/Trunk Assessment Upper Extremity Assessment Upper Extremity Assessment: Overall WFL for tasks assessed   Lower Extremity Assessment Lower Extremity Assessment: Defer to PT evaluation LLE Deficits / Details: Painful and swollen from cellulitis. Decreased stance time on LLE due to pain.   Cervical / Trunk Assessment Cervical / Trunk Assessment: Normal   Communication Communication Communication: No difficulties   Cognition Arousal/Alertness: Awake/alert Behavior During Therapy: WFL for tasks assessed/performed Overall Cognitive Status: Within Functional Limits for tasks assessed                                       General Comments  L limb wrapped. 84-86% on RA througohut session. Pt asymptomatic. Placed on 1 L/min via [place] upon exit. RN notified.            Home Living Family/patient expects to be discharged to:: Private residence Living Arrangements: Alone Available Help at Discharge: Available PRN/intermittently;Friend(s) Type of Home: House Home Access: Stairs to enter [employer] of Steps: 4 Entrance Stairs-Rails: Left Home Layout: Two level;Able to live on main level with bedroom/bathroom     Bathroom Shower/Tub: Tub only;Walk-in shower         Home Equipment: None   Additional Comments: his mother has a RW he could use if needed.      Prior Functioning/Environment Prior Level of Function : Independent/Modified Independent;Working/employed               ADLs Comments: works full time at night in [employer] and drives himself to HD appointments        OT Problem List: Decreased strength;Decreased activity tolerance;Impaired balance (sitting and/or standing);Decreased safety awareness;Decreased knowledge of use of DME or AE;Decreased  cognition;Cardiopulmonary status limiting activity      OT Treatment/Interventions: Self-care/ADL training;Therapeutic exercise;Therapeutic activities;DME and/or AE instruction;Balance training;Patient/family education;Energy conservation    OT Goals(Current goals can be found in the care plan section) Acute Rehab OT Goals Patient Stated Goal: to get better and go home OT Goal Formulation: With patient Time For Goal Achievement: 10/13/22 Potential to Achieve Goals: Good ADL Goals Pt Will Perform Grooming: with modified independence Pt Will Perform Lower Body Dressing: with modified independence Pt Will Transfer to Toilet: with modified independence Pt Will Perform Toileting - Clothing Manipulation and hygiene: with modified independence  OT Frequency: Min 2X/week       AM-PAC OT "6 Clicks" Daily Activity     Outcome Measure Help from another person eating meals?: None Help from another person taking care of personal grooming?: None Help from another person toileting, which includes using toliet, bedpan, or urinal?: A Little Help from another person bathing (including washing, rinsing, drying)?: A Little Help from another person to put on and taking off regular upper body clothing?: None Help from another person to put on and taking off regular lower body clothing?: A Little 6 Click Score: 21   End of Session Equipment Utilized  During Treatment: Rolling walker (2 wheels) Nurse Communication: Mobility status  Activity Tolerance: Patient tolerated treatment well Patient left: in chair;with call bell/phone within reach  OT Visit Diagnosis: Unsteadiness on feet (R26.81);Muscle weakness (generalized) (M62.81)                Time: 9396-8864 OT Time Calculation (min): 22 min Charges:  OT General Charges $OT Visit: 1 Visit OT Evaluation $OT Eval Moderate Complexity: 1 Mod OT Treatments $Therapeutic Activity: 8-22 mins  Darleen Crocker, MS, OTR/L , CBIS ascom [phone]   09/29/22, 1:23 PM

## 2022-09-29 NOTE — Consult Note (Signed)
[facility] for warfarin Indication: DVT  Allergies  Allergen Reactions   Lisinopril Swelling    Lips mouth    Furosemide Cough    Light headed, blurry vision, weakness.   Latex    Adhesive [Tape] Itching and Rash    Patient Measurements: Height: '6\' 4"'$  (193 cm) Weight: (!) 178.3 kg (393 lb 1.3 oz) IBW/kg (Calculated) : 86.8  Vital Signs: Temp: 98 F (36.7 C) (01/31 0505) Temp Source: Oral (01/31 0505) BP: 149/69 (01/31 0505) Pulse Rate: 57 (01/31 0505)  Labs: Recent Labs    09/27/22 1427 09/27/22 1707 09/28/22 0448 09/29/22 0608  HGB 11.7*  --  10.6* 11.8*  HCT 36.0*  --  33.0* 35.8*  PLT 208  --  223 275  LABPROT  --  28.6* 33.7*  --   INR  --  2.7* 3.4*  --   CREATININE 13.69*  --  15.07*  --      Estimated Creatinine Clearance: 10 mL/min (A) (by C-G formula based on SCr of 15.07 mg/dL (H)).   Medical History: Past Medical History:  Diagnosis Date   Anemia    Chronic kidney disease    14% FUNCTION   Diabetes mellitus without complication (HCC)    Diabetic retinopathy ([place])    Dyspnea    DOE   Dysrhythmia    End stage renal disease ([place])    History of orthopnea    error wrong patietn   Hypercholesteremia    error   Hyperkalemia    error wrong patient   Hypertension    Long-term insulin use (HCC)    Lymphedema of leg    Metabolic encephalopathy    error   Microalbuminuria    MSSA (methicillin susceptible Staphylococcus aureus)    Obesity    PVD (peripheral vascular disease) (HCC)    Respiratory failure (HCC)    Error   Sepsis (HCC)    error   Sleep apnea    no CPAP   UTI (urinary tract infection)    error   Venous ulcer (HCC)    Vitamin D deficiency     Medications:  PTA Warfarin dosing '10mg'$  by mouth daily (last dose was 1/28)   Assessment: 52 year old male on warfarin for history of DVT. Currenlty admitted inpatient for cellulitis. Pharmacy consulted for management of warfarin  inpatient.  Goal of Therapy:  INR 2-3 Monitor platelets by anticoagulation protocol: Yes   Plan: INR supartherapeutic ---hold warfarin today ---Check INR tomorrow morning with AM labs  Dallie Piles 09/29/2022,7:07 AM

## 2022-09-29 NOTE — Evaluation (Signed)
Physical Therapy Evaluation Patient Details Name: [name] MRN: [id number] DOB: [birth year] Today's Date: 09/29/2022  History of Present Illness  Slate A Shawler is a 52 y.o. male past medical history of end-stage renal disease on dialysis, diabetes hyperlipidemia peripheral vascular disease lymphedema who presents with concern for cellulitis.  Patient tells me that for about a week he has had left lower extremity redness and swelling and pain.  Has had an ulcer on that leg for some time but seem to be improving prior to the onset of this redness.  Tells me left leg is usually more swollen than the right but it is much more swollen today.  He is also had cough body aches decreased appetite and at dialysis had positive COVID test on Saturday.  Last dialysis was Saturday was full session.  Denies increasing dyspnea from baseline.   Clinical Impression  Pt admitted with above diagnosis. Pt received upright in bed agreeable to PT. At baseline pt lives alone, is independent with mobility and ADL's/IADL's. Has fiance that is around intermittently but not 24/7. Reports his fiance can be available at home more frequently if he needs the extra assistance.   Pt on RA per RN. He is satting at 84-86% throughout session but denies SOB, totally asymptomatic. He is able to transfer to sitting  EoB with time and bed elevated and standing to RW with bed elevated, bouts of momentum at minguard. Min VC's needed for safe hand placement, LE placement and use of RW throughout 40' gait bout. Minimal UE need on RW to assist in some offloading of LLE with antalgic gait noted. Pt does rely on min VC's due to widened BOS to ensure his LE's are within Rw to prevent tripping hazard primarily with STS and turns with good carryover. Pt would benefit from BRW due to body habitus with pt understanding. RN and NT notified. Plan in place to provide BRW to pt for safer gait during admission. Pt return to recliner with all needs in reach,  placed on 1 L/min O2 via [place]. RN notified to assess sats. Pt reports near baseline function overall, will progress mobility as able to further gait, stair training prior to d/c home. Pt understanding, denies SOB or difficulty with breathing. Overall agreeable to POC. Pt currently with functional limitations due to the deficits listed below (see PT Problem List). Pt will benefit from skilled PT to increase their independence and safety with mobility to allow discharge to the venue listed below.      Recommendations for follow up therapy are one component of a multi-disciplinary discharge planning process, led by the attending physician.  Recommendations may be updated based on patient status, additional functional criteria and insurance authorization.  Follow Up Recommendations Home health PT      Assistance Recommended at Discharge Intermittent Supervision/Assistance  Patient can return home with the following  A little help with walking and/or transfers;A little help with bathing/dressing/bathroom;Assistance with cooking/housework;Assist for transportation;Help with stairs or ramp for entrance    Equipment Recommendations  (bariatic RW)  Recommendations for Other Services       Functional Status Assessment Patient has had a recent decline in their functional status and demonstrates the ability to make significant improvements in function in a reasonable and predictable amount of time.     Precautions / Restrictions Precautions Precautions: Fall Precaution Comments: BRW Restrictions Weight Bearing Restrictions: No      Mobility  Bed Mobility Overal bed mobility: Needs Assistance Bed Mobility: Supine to  Sit     Supine to sit: HOB elevated     General bed mobility comments: increased time Patient Response: Cooperative  Transfers Overall transfer level: Needs assistance Equipment used: Rolling walker (2 wheels) Transfers: Sit to/from Stand Sit to Stand: Min guard, From  elevated surface                Ambulation/Gait Ambulation/Gait assistance: Supervision Gait Distance (Feet): 40 Feet Assistive device: Rolling walker (2 wheels) Gait Pattern/deviations: Step-to pattern, Antalgic, Wide base of support       General Gait Details: L antalgic gait, light UE support to offload LLE in stance phase. Steady overall with RW. Needs BRW due to body habitus to reduce falls risk as pt's BOS is wider than normal RW.  Stairs            Wheelchair Mobility    Modified Rankin (Stroke Patients Only)       Balance Overall balance assessment: Needs assistance Sitting-balance support: Bilateral upper extremity supported, Feet supported Sitting balance-Leahy Scale: Good       Standing balance-Leahy Scale: Fair Standing balance comment: without UE on RW                             Pertinent Vitals/Pain Pain Assessment Pain Assessment: Faces Faces Pain Scale: Hurts a little bit Pain Location: L lower leg and foot Pain Descriptors / Indicators: Grimacing Pain Intervention(s): Limited activity within patient's tolerance, Monitored during session, Repositioned    Home Living Family/patient expects to be discharged to:: Private residence Living Arrangements: Alone Available Help at Discharge: Other (Comment);Available PRN/intermittently (Fiance) Type of Home: House Home Access: Stairs to enter Entrance Stairs-Rails: Left Entrance Stairs-Number of Steps: 4   Home Layout: Two level;Able to live on main level with bedroom/bathroom Home Equipment: None Additional Comments: his mother has a RW he could use if needed.    Prior Function Prior Level of Function : Independent/Modified Independent                     Hand Dominance        Extremity/Trunk Assessment   Upper Extremity Assessment Upper Extremity Assessment: Defer to OT evaluation    Lower Extremity Assessment Lower Extremity Assessment: LLE  deficits/detail LLE Deficits / Details: Painful and swollen from cellulitis. Decreased stance time on LLE due to pain.    Cervical / Trunk Assessment Cervical / Trunk Assessment: Normal  Communication   Communication: No difficulties  Cognition Arousal/Alertness: Awake/alert Behavior During Therapy: WFL for tasks assessed/performed Overall Cognitive Status: Within Functional Limits for tasks assessed                                          General Comments General comments (skin integrity, edema, etc.): L limb wrapped. 84-86% on RA througohut session. Pt asymptomatic. Placed on 1 L/min via [place] upon exit. RN notified.    Exercises Other Exercises Other Exercises: use of BRW to offload painful LLE for gait. Role of PT in acute setting, d/c recs   Assessment/Plan    PT Assessment Patient needs continued PT services  PT Problem List Pain;Decreased mobility;Decreased skin integrity       PT Treatment Interventions DME instruction;Therapeutic exercise;Gait training;Balance training;Stair training;Neuromuscular re-education;Functional mobility training;Therapeutic activities;Patient/family education    PT Goals (Current goals can be found in the Care  Plan section)  Acute Rehab PT Goals Patient Stated Goal: To return home PT Goal Formulation: With patient Time For Goal Achievement: 10/13/22 Potential to Achieve Goals: Good    Frequency Min 2X/week     Co-evaluation               AM-PAC PT "6 Clicks" Mobility  Outcome Measure Help needed turning from your back to your side while in a flat bed without using bedrails?: A Little Help needed moving from lying on your back to sitting on the side of a flat bed without using bedrails?: A Little Help needed moving to and from a bed to a chair (including a wheelchair)?: A Little Help needed standing up from a chair using your arms (e.g., wheelchair or bedside chair)?: A Little Help needed to walk in hospital  room?: A Little Help needed climbing 3-5 steps with a railing? : A Lot 6 Click Score: 17    End of Session Equipment Utilized During Treatment: Gait belt Activity Tolerance: Patient tolerated treatment well Patient left: in chair;with call bell/phone within reach;with chair alarm set Nurse Communication: Mobility status PT Visit Diagnosis: Other abnormalities of gait and mobility (R26.89);Pain Pain - Right/Left: Left Pain - part of body: Leg;Ankle and joints of foot    Time: 4562-5638 PT Time Calculation (min) (ACUTE ONLY): 23 min   Charges:   PT Evaluation $PT Eval Low Complexity: 1 Low PT Treatments $Gait Training: 8-22 mins       Salem Caster. Fairly IV, PT, DPT Physical Therapist- [facility]  09/29/2022, 10:45 AM

## 2022-09-29 NOTE — Consult Note (Signed)
Pharmacy Antibiotic Note  [name] is a 52 y.o. male admitted on 09/27/2022 with cellulitis. PMH significant for T1DM, ESRD on HD TTS, HTN, PVD, history of DVT on warfarin, lymphedema. Pharmacy has been consulted for vancomycin dosing.  Plan: Day 3 of antibiotics Give vancomycin 1000 mg IV QHD (TTS). Goal trough 15-25 mcg/mL Plan to check vancomycin level prior to 4th maintenance dose (anticipate 10/05/22) Patient is also on remdesivir 100 mg IV Q24H and ceftriaxone 1g IV Q24H Continue to monitor renal function and follow culture results   Height: '6\' 4"'$  (193 cm) Weight: (!) 178.3 kg (393 lb 1.3 oz) IBW/kg (Calculated) : 86.8  Temp (24hrs), Avg:98.1 F (36.7 C), Min:97.7 F (36.5 C), Max:98.5 F (36.9 C)  Recent Labs  Lab 09/27/22 1427 09/27/22 1707 09/28/22 0448 09/29/22 0608  WBC 19.5*  --  19.6* 25.0*  CREATININE 13.69*  --  15.07*  --   LATICACIDVEN  --  1.5  --   --   VANCORANDOM  --   --  23  --     Estimated Creatinine Clearance: 10 mL/min (A) (by C-G formula based on SCr of 15.07 mg/dL (H)).    Allergies  Allergen Reactions   Lisinopril Swelling    Lips mouth    Furosemide Cough    Light headed, blurry vision, weakness.   Latex    Adhesive [Tape] Itching and Rash   Antimicrobials this admission: 1/29 Remdesivir >> 1/29 Vancomycin >>  1/30 Ceftriaxone >>  Microbiology results: 1/29 BCx: NG2D 1/29 COVID PCR: positive   Thank you for allowing pharmacy to be a part of this patient's care.  Gretel Acre, PharmD PGY1 Pharmacy Resident 09/29/2022 7:52 AM

## 2022-09-29 NOTE — Progress Notes (Addendum)
Progress Note    [name]  VOH:[id number] DOB: 1971-08-28  DOA: 09/27/2022 PCP: [name], MD      Brief Narrative:    Medical records reviewed and are as summarized below:  [name] is a 52 y.o. male with medical history significant for left lower extremity lymphedema, chronic venous insufficiency, remote history of lower extremity cellulitis, PVD, ESRD on hemodialysis, blood clot in dialysis graft on Coumadin, sleep apnea not on CPAP, insulin-dependent diabetes mellitus, hypertension, hypercholesterolemia, chronic anemia, morbid obesity.  He has a left ankle ulcer and he saw Dr. Delana Meyer, vascular surgeon, in the office on 08/19/2022 for this.  Local wound care and tramadol were prescribed at that time.  He noticed increasing pain, redness and swelling in the left leg and foot about a week prior to admission. He went to see Dr. Delana Meyer in the office on 09/26/2022.  He presented to the emergency room for further management.  He also complained of cough, body aches and decreased appetite.  He tested positive for COVID-19 infection on Saturday, 09/25/2022.  He was admitted to the hospital for cellulitis of the left lower extremity.     Assessment/Plan:   Principal Problem:   Cellulitis of left lower extremity Active Problems:   Diabetes mellitus without complication (HCC)   Chronic venous insufficiency   Venous ulcer of ankle, left (HCC)   CKD (chronic kidney disease) stage 5, GFR less than 15 ml/min (HCC)   Morbid obesity with BMI of 45.0-49.9, adult (HCC)   End-stage renal disease ([place])   Diabetic ulcer of lower leg ([place])   COVID-19 virus infection   Body mass index is 47.85 kg/m.  (Morbid obesity)    Cellulitis of left lower extremity, chronic venous insufficiency bilateral legs, left leg lymphedema, left ankle ulcer: Continue IV ceftriaxone and vancomycin.  No growth on blood cultures thus far.  Continue analgesics as needed for pain.  Continue local  wound care.     COVID-19 infection: Continue IV dexamethasone.  Completed 3 doses of IV remdesivir on 09/29/2022.    Acute hypoxic respiratory failure: Oxygen saturation dropped to 84% during physical therapy today.  Continue 2 L/min oxygen via [place].  Continue attempts at weaning off oxygen.     Insulin-dependent diabetes mellitus with hyperglycemia: Increase insulin glargine from 15 to 20 units nightly because of IV dexamethasone.  NovoLog as needed for hyperglycemia.   ESRD, hyperkalemia: Plan for hemodialysis tomorrow.  Follow-up with nephrologist   PVD, history of DVT, history of clot in AV graft: INR is supratherapeutic at 4.1.  Warfarin has been held.  Follow-up with pharmacist for INR monitoring and warfarin dosing.  Elevated liver enzymes: Improved   General weakness: PT and OT recommended home health therapy   Diet Order             Diet Carb Modified Fluid consistency: Thin; Room service appropriate? Yes  Diet effective now                            Consultants: Nephrologist  Procedures: None    Medications:    allopurinol  100 mg Oral Daily   bromocriptine  2.5 mg Oral QHS   calcium acetate  2,001 mg Oral TID WC   Chlorhexidine Gluconate Cloth  6 each Topical Q0600   cinacalcet  30 mg Oral Q breakfast   dexamethasone (DECADRON) injection  6 mg Intravenous Q24H   insulin aspart  0-15 Units Subcutaneous TID WC   insulin aspart  0-5 Units Subcutaneous QHS   insulin glargine-yfgn  15 Units Subcutaneous QHS   multivitamin  1 tablet Oral Once per day on Mon Wed Fri   sodium chloride flush  3 mL Intravenous Q12H   Warfarin - Pharmacist Dosing Inpatient   Does not apply q1600   Continuous Infusions:  cefTRIAXone (ROCEPHIN)  IV 1 g (09/28/22 1834)   vancomycin 1,000 mg (09/28/22 1454)     Anti-infectives (From admission, onward)    Start     Dose/Rate Route Frequency Ordered Stop   09/28/22 1630  cefTRIAXone (ROCEPHIN) 1 g in sodium chloride  0.9 % 100 mL IVPB        1 g 200 mL/hr over 30 Minutes Intravenous Every 24 hours 09/28/22 1532     09/28/22 1200  vancomycin (VANCOCIN) IVPB 1000 mg/200 mL premix        1,000 mg 200 mL/hr over 60 Minutes Intravenous Every T-Th-Sa (Hemodialysis) 09/28/22 0833     09/28/22 1000  remdesivir 100 mg in sodium chloride 0.9 % 100 mL IVPB       See Hyperspace for full Linked Orders Report.   100 mg 200 mL/hr over 30 Minutes Intravenous Daily 09/27/22 2028 09/29/22 0925   09/27/22 2030  remdesivir 200 mg in sodium chloride 0.9% 250 mL IVPB       See Hyperspace for full Linked Orders Report.   200 mg 580 mL/hr over 30 Minutes Intravenous Once 09/27/22 2028 09/27/22 2335   09/27/22 2015  vancomycin (VANCOREADY) IVPB 500 mg/100 mL        500 mg 100 mL/hr over 60 Minutes Intravenous  Once 09/27/22 2008 09/28/22 0101   09/27/22 2007  vancomycin variable dose per unstable renal function (pharmacist dosing)  Status:  Discontinued         Does not apply See admin instructions 09/27/22 2008 09/28/22 0833   09/27/22 1730  vancomycin (VANCOREADY) IVPB 2000 mg/400 mL        2,000 mg 200 mL/hr over 120 Minutes Intravenous  Once 09/27/22 1707 09/27/22 2313   09/27/22 1715  cefTRIAXone (ROCEPHIN) 2 g in sodium chloride 0.9 % 100 mL IVPB        2 g 200 mL/hr over 30 Minutes Intravenous  Once 09/27/22 1707 09/27/22 1933              Family Communication/Anticipated D/C date and plan/Code Status   DVT prophylaxis:      Code Status: Full Code  Family Communication: None Disposition Plan: Plan to discharge home in 2 to 3 days   Status is: Inpatient Remains inpatient appropriate because: On IV antibiotics       Subjective:   Interval events noted.  He complains of cough and left leg pain.  Objective:    Vitals:   09/28/22 2020 09/29/22 0505 09/29/22 0748 09/29/22 0844  BP: (!) 143/69 (!) 149/69 113/65   Pulse: 61 (!) 57 (!) 51   Resp: '20 20 18   '$ Temp: 98.1 F (36.7 C) 98 F  (36.7 C) 97.7 F (36.5 C)   TempSrc: Oral Oral Oral   SpO2: 95% 94% 97% 95%  Weight:      Height:       No data found.   Intake/Output Summary (Last 24 hours) at 09/29/2022 1432 Last data filed at 09/29/2022 0530 Gross per 24 hour  Intake 240 ml  Output --  Net 240 ml   [employer]  09/27/22 1610 09/28/22 1056 09/28/22 1413  Weight: (!) 180 kg (!) 180.4 kg (!) 178.3 kg    Exam:  GEN: NAD SKIN: Warm and dry EYES: No pallor or icterus ENT: MMM CV: RRR PULM: CTA B ABD: soft, ND, NT, +BS CNS: AAO x 3, non focal EXT: Left leg swelling and tenderness    Data Reviewed:   I have personally reviewed following labs and imaging studies:  Labs: Labs show the following:   Basic Metabolic Panel: Recent Labs  Lab 09/27/22 1427 09/28/22 0448 09/29/22 0608  NA 132* 134* 135  K 4.6 4.5 5.4*  CL 89* 95* 93*  CO2 24 24 21*  GLUCOSE 213* 180* 229*  BUN 73* 85* 79*  CREATININE 13.69* 15.07* 13.19*  CALCIUM 9.1 8.5* 9.1  PHOS 5.9*  --   --    GFR Estimated Creatinine Clearance: 11.4 mL/min (A) (by C-G formula based on SCr of 13.19 mg/dL (H)). Liver Function Tests: Recent Labs  Lab 09/27/22 1427 09/29/22 0608  AST 50* 26  ALT 45* 33  ALKPHOS 121 107  BILITOT 1.2 1.0  PROT 8.6* 7.5  ALBUMIN 3.0* 2.4*   No results for input(s): "LIPASE", "AMYLASE" in the last 168 hours. No results for input(s): "AMMONIA" in the last 168 hours. Coagulation profile Recent Labs  Lab 09/27/22 1707 09/28/22 0448 09/29/22 0608  INR 2.7* 3.4* 4.1*    CBC: Recent Labs  Lab 09/27/22 1427 09/28/22 0448 09/29/22 0608  WBC 19.5* 19.6* 25.0*  NEUTROABS  --   --  23.6*  HGB 11.7* 10.6* 11.8*  HCT 36.0* 33.0* 35.8*  MCV 97.3 96.2 95.2  PLT 208 223 275   Cardiac Enzymes: No results for input(s): "CKTOTAL", "CKMB", "CKMBINDEX", "TROPONINI" in the last 168 hours. BNP (last 3 results) No results for input(s): "PROBNP" in the last 8760 hours. CBG: Recent Labs  Lab  09/28/22 1317 09/28/22 1826 09/28/22 2017 09/29/22 0744 09/29/22 1139  GLUCAP 128* 219* 236* 250* 298*   D-Dimer: No results for input(s): "DDIMER" in the last 72 hours. Hgb A1c: Recent Labs    09/27/22 2035  HGBA1C 8.8*   Lipid Profile: No results for input(s): "CHOL", "HDL", "LDLCALC", "TRIG", "CHOLHDL", "LDLDIRECT" in the last 72 hours. Thyroid function studies: No results for input(s): "TSH", "T4TOTAL", "T3FREE", "THYROIDAB" in the last 72 hours.  Invalid input(s): "FREET3" Anemia work up: No results for input(s): "VITAMINB12", "FOLATE", "FERRITIN", "TIBC", "IRON", "RETICCTPCT" in the last 72 hours. Sepsis Labs: Recent Labs  Lab 09/27/22 1427 09/27/22 1707 09/28/22 0448 09/29/22 0608  WBC 19.5*  --  19.6* 25.0*  LATICACIDVEN  --  1.5  --   --     Microbiology Recent Results (from the past 240 hour(s))  Blood culture (routine x 2)     Status: None (Preliminary result)   Collection Time: 09/27/22  5:07 PM   Specimen: BLOOD  Result Value Ref Range Status   Specimen Description BLOOD RIGHT ANTECUBITAL  Final   Special Requests   Final    BOTTLES DRAWN AEROBIC AND ANAEROBIC Blood Culture results may not be optimal due to an inadequate volume of blood received in culture bottles   Culture   Final    NO GROWTH 2 DAYS Performed at [facility], [address] [place],  [address]    Report Status PENDING  Incomplete  Resp panel by RT-PCR (RSV, Flu A&B, Covid) Anterior Nasal Swab     Status: Abnormal   Collection Time: 09/27/22  5:07 PM  Specimen: Anterior Nasal Swab  Result Value Ref Range Status   SARS Coronavirus 2 by RT PCR POSITIVE (A) NEGATIVE Final    Comment: (NOTE) SARS-CoV-2 target nucleic acids are DETECTED.  The SARS-CoV-2 RNA is generally detectable in upper respiratory specimens during the acute phase of infection. Positive results are indicative of the presence of the identified virus, but do not rule out bacterial infection or  co-infection with other pathogens not detected by the test. Clinical correlation with patient history and other diagnostic information is necessary to determine patient infection status. The expected result is Negative.  Fact Sheet for Patients: EntrepreneurPulse.com.au  Fact Sheet for Healthcare Providers: IncredibleEmployment.be  This test is not yet approved or cleared by the [place] FDA and  has been authorized for detection and/or diagnosis of SARS-CoV-2 by FDA under an Emergency Use Authorization (EUA).  This EUA will remain in effect (meaning this test can be used) for the duration of  the COVID-19 declaration under Section 564(b)(1) of the A ct, 21 U.S.C. section 360bbb-3(b)(1), unless the authorization is terminated or revoked sooner.     Influenza A by PCR NEGATIVE NEGATIVE Final   Influenza B by PCR NEGATIVE NEGATIVE Final    Comment: (NOTE) The Xpert Xpress SARS-CoV-2/FLU/RSV plus assay is intended as an aid in the diagnosis of influenza from Nasopharyngeal swab specimens and should not be used as a sole basis for treatment. Nasal washings and aspirates are unacceptable for Xpert Xpress SARS-CoV-2/FLU/RSV testing.  Fact Sheet for Patients: EntrepreneurPulse.com.au  Fact Sheet for Healthcare Providers: IncredibleEmployment.be  This test is not yet approved or cleared by the [place] FDA and has been authorized for detection and/or diagnosis of SARS-CoV-2 by FDA under an Emergency Use Authorization (EUA). This EUA will remain in effect (meaning this test can be used) for the duration of the COVID-19 declaration under Section 564(b)(1) of the Act, 21 U.S.C. section 360bbb-3(b)(1), unless the authorization is terminated or revoked.     Resp Syncytial Virus by PCR NEGATIVE NEGATIVE Final    Comment: (NOTE) Fact Sheet for Patients: EntrepreneurPulse.com.au  Fact  Sheet for Healthcare Providers: IncredibleEmployment.be  This test is not yet approved or cleared by the [place] FDA and has been authorized for detection and/or diagnosis of SARS-CoV-2 by FDA under an Emergency Use Authorization (EUA). This EUA will remain in effect (meaning this test can be used) for the duration of the COVID-19 declaration under Section 564(b)(1) of the Act, 21 U.S.C. section 360bbb-3(b)(1), unless the authorization is terminated or revoked.  Performed at [facility], [address] [place], [place] [address]   Blood culture (routine x 2)     Status: None (Preliminary result)   Collection Time: 09/27/22  5:12 PM   Specimen: BLOOD  Result Value Ref Range Status   Specimen Description BLOOD LEFT ANTECUBITAL  Final   Special Requests   Final    BOTTLES DRAWN AEROBIC AND ANAEROBIC Blood Culture results may not be optimal due to an inadequate volume of blood received in culture bottles   Culture   Final    NO GROWTH 2 DAYS Performed at [facility], [address] [place], [place] [address]    Report Status PENDING  Incomplete    Procedures and diagnostic studies:  US Venous Img Lower Bilateral (DVT)  Result Date: 09/27/2022 CLINICAL DATA:  Left lower extremity swelling for 1 month EXAM: BILATERAL LOWER EXTREMITY VENOUS DOPPLER ULTRASOUND TECHNIQUE: Gray-scale sonography with compression, as well as color and duplex ultrasound,  were performed to evaluate the deep venous system(s) from the level of the common femoral vein through the popliteal and proximal calf veins. COMPARISON:  None Available. FINDINGS: VENOUS Normal compressibility of the common femoral, superficial femoral, and popliteal veins, as well as the visualized calf veins. Limited visualization of the left calf veins. Visualized portions of profunda femoral vein and great saphenous vein unremarkable. No filling defects to suggest DVT on grayscale or color  Doppler imaging. Doppler waveforms show normal direction of venous flow, normal respiratory plasticity and response to augmentation. OTHER Soft tissue edema of the left calf. Limitations: none IMPRESSION: 1. Negative examination for deep venous thrombosis in the bilateral lower extremities. Limited visualization of the left calf veins. 2. Soft tissue edema of the left calf. Electronically Signed   By: Delanna Ahmadi M.D.   On: 09/27/2022 18:20   DG Chest Portable 1 View  Result Date: 09/27/2022 CLINICAL DATA:  Shortness of breath. Cellulitis. EXAM: PORTABLE CHEST 1 VIEW COMPARISON:  10/15/2017 FINDINGS: Borderline cardiomegaly with stable mediastinal contours. Mild diffuse peribronchial and interstitial thickening. Linear opacity in the right lower lung zone may represent atelectasis or fluid in the fissure. No large subpulmonic effusion. No confluent consolidation. No pneumothorax. No acute osseous findings. IMPRESSION: 1. Borderline cardiomegaly. 2. Mild diffuse peribronchial and interstitial thickening, may be pulmonary edema or bronchitic. 3. Linear opacity in the right lower lung zone may represent atelectasis or fluid in the fissure. Electronically Signed   By: Keith Rake M.D.   On: 09/27/2022 17:26               LOS: 2 days   Hiliary Osorto  [facility]   Pager on [email or web address]. If 7PM-7AM, please contact night-coverage at [email or web address]     09/29/2022, 2:32 PM

## 2022-09-30 DIAGNOSIS — E1165 Type 2 diabetes mellitus with hyperglycemia: Secondary | ICD-10-CM

## 2022-09-30 DIAGNOSIS — N186 End stage renal disease: Secondary | ICD-10-CM | POA: Diagnosis not present

## 2022-09-30 DIAGNOSIS — L03116 Cellulitis of left lower limb: Secondary | ICD-10-CM | POA: Diagnosis not present

## 2022-09-30 DIAGNOSIS — I872 Venous insufficiency (chronic) (peripheral): Secondary | ICD-10-CM | POA: Diagnosis not present

## 2022-09-30 DIAGNOSIS — U071 COVID-19: Secondary | ICD-10-CM | POA: Diagnosis not present

## 2022-09-30 DIAGNOSIS — Z794 Long term (current) use of insulin: Secondary | ICD-10-CM

## 2022-09-30 DIAGNOSIS — Z992 Dependence on renal dialysis: Secondary | ICD-10-CM | POA: Diagnosis not present

## 2022-09-30 DIAGNOSIS — N2581 Secondary hyperparathyroidism of renal origin: Secondary | ICD-10-CM | POA: Diagnosis not present

## 2022-09-30 LAB — GLUCOSE, CAPILLARY
Glucose-Capillary: 368 mg/dL — ABNORMAL HIGH (ref 70–99)
Glucose-Capillary: 328 mg/dL — ABNORMAL HIGH (ref 70–99)
Glucose-Capillary: 176 mg/dL — ABNORMAL HIGH (ref 70–99)
Glucose-Capillary: 239 mg/dL — ABNORMAL HIGH (ref 70–99)

## 2022-09-30 LAB — CBC WITH DIFFERENTIAL/PLATELET
Abs Immature Granulocytes: 0.25 10*3/uL — ABNORMAL HIGH (ref 0.00–0.07)
Basophils Absolute: 0.1 10*3/uL (ref 0.0–0.1)
Basophils Relative: 0 %
Eosinophils Absolute: 0 10*3/uL (ref 0.0–0.5)
Eosinophils Relative: 0 %
HCT: 36.1 % — ABNORMAL LOW (ref 39.0–52.0)
Hemoglobin: 12.2 g/dL — ABNORMAL LOW (ref 13.0–17.0)
Immature Granulocytes: 1 %
Lymphocytes Relative: 3 %
Lymphs Abs: 0.5 10*3/uL — ABNORMAL LOW (ref 0.7–4.0)
MCH: 31.5 pg (ref 26.0–34.0)
MCHC: 33.8 g/dL (ref 30.0–36.0)
MCV: 93.3 fL (ref 80.0–100.0)
Monocytes Absolute: 0.5 10*3/uL (ref 0.1–1.0)
Monocytes Relative: 2 %
Neutro Abs: 19.5 10*3/uL — ABNORMAL HIGH (ref 1.7–7.7)
Neutrophils Relative %: 94 %
Platelets: 314 10*3/uL (ref 150–400)
RBC: 3.87 MIL/uL — ABNORMAL LOW (ref 4.22–5.81)
RDW: 13.7 % (ref 11.5–15.5)
WBC: 20.8 10*3/uL — ABNORMAL HIGH (ref 4.0–10.5)
nRBC: 0 % (ref 0.0–0.2)

## 2022-09-30 LAB — RENAL FUNCTION PANEL
Albumin: 2.5 g/dL — ABNORMAL LOW (ref 3.5–5.0)
Anion gap: 16 — ABNORMAL HIGH (ref 5–15)
BUN: 93 mg/dL — ABNORMAL HIGH (ref 6–20)
CO2: 24 mmol/L (ref 22–32)
Calcium: 8.8 mg/dL — ABNORMAL LOW (ref 8.9–10.3)
Chloride: 93 mmol/L — ABNORMAL LOW (ref 98–111)
Creatinine, Ser: 11.94 mg/dL — ABNORMAL HIGH (ref 0.61–1.24)
GFR, Estimated: 5 mL/min — ABNORMAL LOW (ref >60)
Glucose, Bld: 242 mg/dL — ABNORMAL HIGH (ref 70–99)
Phosphorus: 6.5 mg/dL — ABNORMAL HIGH (ref 2.5–4.6)
Potassium: 4.9 mmol/L (ref 3.5–5.1)
Sodium: 133 mmol/L — ABNORMAL LOW (ref 135–145)

## 2022-09-30 LAB — BASIC METABOLIC PANEL
Anion gap: 17 — ABNORMAL HIGH (ref 5–15)
BUN: 115 mg/dL — ABNORMAL HIGH (ref 6–20)
CO2: 23 mmol/L (ref 22–32)
Calcium: 9 mg/dL (ref 8.9–10.3)
Chloride: 93 mmol/L — ABNORMAL LOW (ref 98–111)
Creatinine, Ser: 14.74 mg/dL — ABNORMAL HIGH (ref 0.61–1.24)
GFR, Estimated: 4 mL/min — ABNORMAL LOW (ref >60)
Glucose, Bld: 364 mg/dL — ABNORMAL HIGH (ref 70–99)
Potassium: 5.7 mmol/L — ABNORMAL HIGH (ref 3.5–5.1)
Sodium: 133 mmol/L — ABNORMAL LOW (ref 135–145)

## 2022-09-30 LAB — PROTIME-INR
INR: 4.1 (ref 0.8–1.2)
Prothrombin Time: 39.5 seconds — ABNORMAL HIGH (ref 11.4–15.2)

## 2022-09-30 LAB — CBC
HCT: 32.9 % — ABNORMAL LOW (ref 39.0–52.0)
Hemoglobin: 11.6 g/dL — ABNORMAL LOW (ref 13.0–17.0)
MCH: 31.7 pg (ref 26.0–34.0)
MCHC: 35.3 g/dL (ref 30.0–36.0)
MCV: 89.9 fL (ref 80.0–100.0)
Platelets: 340 10*3/uL (ref 150–400)
RBC: 3.66 MIL/uL — ABNORMAL LOW (ref 4.22–5.81)
RDW: 13.1 % (ref 11.5–15.5)
WBC: 24.8 10*3/uL — ABNORMAL HIGH (ref 4.0–10.5)
nRBC: 0 % (ref 0.0–0.2)

## 2022-09-30 MED ORDER — INSULIN GLARGINE-YFGN 100 UNIT/ML ~~LOC~~ SOLN
30.0000 [IU] | Freq: Every day | SUBCUTANEOUS | Status: DC
  Administered 2022-09-30: 30 [IU] via SUBCUTANEOUS
  Filled 2022-09-30 (×2): qty 0.3

## 2022-09-30 MED ORDER — INSULIN ASPART 100 UNIT/ML IJ SOLN
6.0000 [IU] | Freq: Three times a day (TID) | INTRAMUSCULAR | Status: DC
  Administered 2022-09-30 – 2022-10-01 (×3): 6 [IU] via SUBCUTANEOUS
  Filled 2022-09-30 (×3): qty 1

## 2022-09-30 NOTE — Progress Notes (Addendum)
Progress Note    [name]  [name]:[id number] DOB: 16-Jul-1971  DOA: 09/27/2022 PCP: [name], MD      Brief Narrative:    Medical records reviewed and are as summarized below:  [name] is a 52 y.o. male with medical history significant for left lower extremity lymphedema, chronic venous insufficiency, remote history of lower extremity cellulitis, PVD, ESRD on hemodialysis, blood clot in dialysis graft on Coumadin, sleep apnea not on CPAP, insulin-dependent diabetes mellitus, hypertension, hypercholesterolemia, chronic anemia, morbid obesity.  He has a left ankle ulcer and he saw Dr. Delana Meyer, vascular surgeon, in the office on 08/19/2022 for this.  Local wound care and tramadol were prescribed at that time.  He noticed increasing pain, redness and swelling in the left leg and foot about a week prior to admission. He went to see Dr. Delana Meyer in the office on 09/26/2022.  He presented to the emergency room for further management.  He also complained of cough, body aches and decreased appetite.  He tested positive for COVID-19 infection on Saturday, 09/25/2022.  He was admitted to the hospital for cellulitis of the left lower extremity.     Assessment/Plan:   Principal Problem:   Cellulitis of left lower extremity Active Problems:   Diabetes mellitus with hyperglycemia (HCC)   Chronic venous insufficiency   Venous ulcer of ankle, left (HCC)   CKD (chronic kidney disease) stage 5, GFR less than 15 ml/min (HCC)   Morbid obesity with BMI of 45.0-49.9, adult (HCC)   End-stage renal disease ([place])   Diabetic ulcer of lower leg ([place])   COVID-19 virus infection   Body mass index is 47.85 kg/m.  (Morbid obesity)    Cellulitis of left lower extremity, chronic venous insufficiency bilateral legs, left leg lymphedema, left ankle ulcer, leukocytosis: Continue IV ceftriaxone and vancomycin.  Monitor CBC.  No growth on blood cultures so far..  Continue analgesics as needed  for pain.  Continue local wound care.     COVID-19 infection: Continue IV dexamethasone.  Completed 3 doses of IV remdesivir on 09/29/2022.    Acute hypoxic respiratory failure: He remains on 2 L/min oxygen via [place].  Wean off oxygen as able.  Insulin-dependent diabetes mellitus with hyperglycemia: Increase insulin glargine from 20 units to 30 units nightly because of worsening hyperglycemia.  Add scheduled NovoLog 6 units 3 times daily with meals.  Continue NovoLog as needed for hyperglycemia.   ESRD, hyperkalemia: Plan for hemodialysis today.  Follow-up with nephrologist.   PVD, history of DVT, history of clot in AV graft: INR is still supratherapeutic at 4.1.  Warfarin is still on hold. Follow-up with pharmacist for INR monitoring and warfarin dosing.  Elevated liver enzymes: Improved   General weakness: PT and OT recommended home health therapy   Diet Order             Diet Carb Modified Fluid consistency: Thin; Room service appropriate? Yes  Diet effective now                            Consultants: Nephrologist  Procedures: None    Medications:    allopurinol  100 mg Oral Daily   bromocriptine  2.5 mg Oral QHS   calcium acetate  2,001 mg Oral TID WC   Chlorhexidine Gluconate Cloth  6 each Topical Q0600   cinacalcet  30 mg Oral Q breakfast   dexamethasone (DECADRON) injection  6 mg  Intravenous Q24H   insulin aspart  0-15 Units Subcutaneous TID WC   insulin aspart  0-5 Units Subcutaneous QHS   insulin aspart  6 Units Subcutaneous TID WC   insulin glargine-yfgn  30 Units Subcutaneous QHS   multivitamin  1 tablet Oral Once per day on Mon Wed Fri   sodium chloride flush  3 mL Intravenous Q12H   Warfarin - Pharmacist Dosing Inpatient   Does not apply q1600   Continuous Infusions:  cefTRIAXone (ROCEPHIN)  IV 1 g (09/29/22 1455)   vancomycin 1,000 mg (09/28/22 1454)     Anti-infectives (From admission, onward)    Start     Dose/Rate Route  Frequency Ordered Stop   09/28/22 1630  cefTRIAXone (ROCEPHIN) 1 g in sodium chloride 0.9 % 100 mL IVPB        1 g 200 mL/hr over 30 Minutes Intravenous Every 24 hours 09/28/22 1532     09/28/22 1200  vancomycin (VANCOCIN) IVPB 1000 mg/200 mL premix        1,000 mg 200 mL/hr over 60 Minutes Intravenous Every T-Th-Sa (Hemodialysis) 09/28/22 0833     09/28/22 1000  remdesivir 100 mg in sodium chloride 0.9 % 100 mL IVPB       See Hyperspace for full Linked Orders Report.   100 mg 200 mL/hr over 30 Minutes Intravenous Daily 09/27/22 2028 09/29/22 0925   09/27/22 2030  remdesivir 200 mg in sodium chloride 0.9% 250 mL IVPB       See Hyperspace for full Linked Orders Report.   200 mg 580 mL/hr over 30 Minutes Intravenous Once 09/27/22 2028 09/27/22 2335   09/27/22 2015  vancomycin (VANCOREADY) IVPB 500 mg/100 mL        500 mg 100 mL/hr over 60 Minutes Intravenous  Once 09/27/22 2008 09/28/22 0101   09/27/22 2007  vancomycin variable dose per unstable renal function (pharmacist dosing)  Status:  Discontinued         Does not apply See admin instructions 09/27/22 2008 09/28/22 0833   09/27/22 1730  vancomycin (VANCOREADY) IVPB 2000 mg/400 mL        2,000 mg 200 mL/hr over 120 Minutes Intravenous  Once 09/27/22 1707 09/27/22 2313   09/27/22 1715  cefTRIAXone (ROCEPHIN) 2 g in sodium chloride 0.9 % 100 mL IVPB        2 g 200 mL/hr over 30 Minutes Intravenous  Once 09/27/22 1707 09/27/22 1933              Family Communication/Anticipated D/C date and plan/Code Status   DVT prophylaxis:      Code Status: Full Code  Family Communication: None Disposition Plan: Plan to discharge home in 2 to 3 days   Status is: Inpatient Remains inpatient appropriate because: On IV antibiotics       Subjective:   He c/o pain and swelling in the left leg and left foot  Objective:    Vitals:   09/29/22 1900 09/29/22 2054 09/30/22 0511 09/30/22 0903  BP: 124/66 (!) 143/84 (!) 148/66 (!)  150/79  Pulse: (!) 56 63 (!) 53 (!) 53  Resp: 18   18  Temp: 97.8 F (36.6 C) 97.6 F (36.4 C) 98 F (36.7 C) 98.9 F (37.2 C)  TempSrc: Oral Oral Oral   SpO2: 96% 94% 97% 96%  Weight:      Height:       No data found.   Intake/Output Summary (Last 24 hours) at 09/30/2022 1351 Last data filed at  09/30/2022 0300 Gross per 24 hour  Intake 440 ml  Output --  Net 440 ml   Filed Weights   09/27/22 1610 09/28/22 1056 09/28/22 1413  Weight: (!) 180 kg (!) 180.4 kg (!) 178.3 kg    Exam:  GEN: NAD SKIN: Warm and dry EYES: EOMI ENT: MMM CV: RRR PULM: CTA B ABD: soft, obese, NT, +BS CNS: AAO x 3, non focal EXT: Swelling, tenderness and erythema of left leg and left foot.  Chronic hyperpigmented changes on bilateral legs.  Wound on the lateral aspect of the left leg without any drainage                 Data Reviewed:   I have personally reviewed following labs and imaging studies:  Labs: Labs show the following:   Basic Metabolic Panel: Recent Labs  Lab 09/27/22 1427 09/28/22 0448 09/29/22 0608 09/30/22 0654  NA 132* 134* 135 133*  K 4.6 4.5 5.4* 5.7*  CL 89* 95* 93* 93*  CO2 24 24 21* 23  GLUCOSE 213* 180* 229* 364*  BUN 73* 85* 79* 115*  CREATININE 13.69* 15.07* 13.19* 14.74*  CALCIUM 9.1 8.5* 9.1 9.0  PHOS 5.9*  --   --   --    GFR Estimated Creatinine Clearance: 10.2 mL/min (A) (by C-G formula based on SCr of 14.74 mg/dL (H)). Liver Function Tests: Recent Labs  Lab 09/27/22 1427 09/29/22 0608  AST 50* 26  ALT 45* 33  ALKPHOS 121 107  BILITOT 1.2 1.0  PROT 8.6* 7.5  ALBUMIN 3.0* 2.4*   No results for input(s): "LIPASE", "AMYLASE" in the last 168 hours. No results for input(s): "AMMONIA" in the last 168 hours. Coagulation profile Recent Labs  Lab 09/27/22 1707 09/28/22 0448 09/29/22 0608 09/30/22 0654  INR 2.7* 3.4* 4.1* 4.1*    CBC: Recent Labs  Lab 09/27/22 1427 09/28/22 0448 09/29/22 0608 09/30/22 0654  WBC 19.5*  19.6* 25.0* 20.8*  NEUTROABS  --   --  23.6* 19.5*  HGB 11.7* 10.6* 11.8* 12.2*  HCT 36.0* 33.0* 35.8* 36.1*  MCV 97.3 96.2 95.2 93.3  PLT 208 223 275 314   Cardiac Enzymes: No results for input(s): "CKTOTAL", "CKMB", "CKMBINDEX", "TROPONINI" in the last 168 hours. BNP (last 3 results) No results for input(s): "PROBNP" in the last 8760 hours. CBG: Recent Labs  Lab 09/29/22 1139 09/29/22 1621 09/29/22 2039 09/30/22 0905 09/30/22 1155  GLUCAP 298* 297* 267* 368* 328*   D-Dimer: No results for input(s): "DDIMER" in the last 72 hours. Hgb A1c: Recent Labs    09/27/22 2035  HGBA1C 8.8*   Lipid Profile: No results for input(s): "CHOL", "HDL", "LDLCALC", "TRIG", "CHOLHDL", "LDLDIRECT" in the last 72 hours. Thyroid function studies: No results for input(s): "TSH", "T4TOTAL", "T3FREE", "THYROIDAB" in the last 72 hours.  Invalid input(s): "FREET3" Anemia work up: No results for input(s): "VITAMINB12", "FOLATE", "FERRITIN", "TIBC", "IRON", "RETICCTPCT" in the last 72 hours. Sepsis Labs: Recent Labs  Lab 09/27/22 1427 09/27/22 1707 09/28/22 0448 09/29/22 0608 09/30/22 0654  WBC 19.5*  --  19.6* 25.0* 20.8*  LATICACIDVEN  --  1.5  --   --   --     Microbiology Recent Results (from the past 240 hour(s))  Blood culture (routine x 2)     Status: None (Preliminary result)   Collection Time: 09/27/22  5:07 PM   Specimen: BLOOD  Result Value Ref Range Status   Specimen Description BLOOD RIGHT ANTECUBITAL  Final   Special Requests  Final    BOTTLES DRAWN AEROBIC AND ANAEROBIC Blood Culture results may not be optimal due to an inadequate volume of blood received in culture bottles   Culture   Final    NO GROWTH 3 DAYS Performed at [facility], [address], [place], [place] [address]    Report Status PENDING  Incomplete  Resp panel by RT-PCR (RSV, Flu A&B, Covid) Anterior Nasal Swab     Status: Abnormal   Collection Time: 09/27/22  5:07 PM   Specimen:  Anterior Nasal Swab  Result Value Ref Range Status   SARS Coronavirus 2 by RT PCR POSITIVE (A) NEGATIVE Final    Comment: (NOTE) SARS-CoV-2 target nucleic acids are DETECTED.  The SARS-CoV-2 RNA is generally detectable in upper respiratory specimens during the acute phase of infection. Positive results are indicative of the presence of the identified virus, but do not rule out bacterial infection or co-infection with other pathogens not detected by the test. Clinical correlation with patient history and other diagnostic information is necessary to determine patient infection status. The expected result is Negative.  Fact Sheet for Patients: EntrepreneurPulse.com.au  Fact Sheet for Healthcare Providers: IncredibleEmployment.be  This test is not yet approved or cleared by the [place] FDA and  has been authorized for detection and/or diagnosis of SARS-CoV-2 by FDA under an Emergency Use Authorization (EUA).  This EUA will remain in effect (meaning this test can be used) for the duration of  the COVID-19 declaration under Section 564(b)(1) of the A ct, 21 U.S.C. section 360bbb-3(b)(1), unless the authorization is terminated or revoked sooner.     Influenza A by PCR NEGATIVE NEGATIVE Final   Influenza B by PCR NEGATIVE NEGATIVE Final    Comment: (NOTE) The Xpert Xpress SARS-CoV-2/FLU/RSV plus assay is intended as an aid in the diagnosis of influenza from Nasopharyngeal swab specimens and should not be used as a sole basis for treatment. Nasal washings and aspirates are unacceptable for Xpert Xpress SARS-CoV-2/FLU/RSV testing.  Fact Sheet for Patients: EntrepreneurPulse.com.au  Fact Sheet for Healthcare Providers: IncredibleEmployment.be  This test is not yet approved or cleared by the [place] FDA and has been authorized for detection and/or diagnosis of SARS-CoV-2 by FDA under an Emergency Use  Authorization (EUA). This EUA will remain in effect (meaning this test can be used) for the duration of the COVID-19 declaration under Section 564(b)(1) of the Act, 21 U.S.C. section 360bbb-3(b)(1), unless the authorization is terminated or revoked.     Resp Syncytial Virus by PCR NEGATIVE NEGATIVE Final    Comment: (NOTE) Fact Sheet for Patients: EntrepreneurPulse.com.au  Fact Sheet for Healthcare Providers: IncredibleEmployment.be  This test is not yet approved or cleared by the [place] FDA and has been authorized for detection and/or diagnosis of SARS-CoV-2 by FDA under an Emergency Use Authorization (EUA). This EUA will remain in effect (meaning this test can be used) for the duration of the COVID-19 declaration under Section 564(b)(1) of the Act, 21 U.S.C. section 360bbb-3(b)(1), unless the authorization is terminated or revoked.  Performed at [facility], [address] [place], [place] [address]   Blood culture (routine x 2)     Status: None (Preliminary result)   Collection Time: 09/27/22  5:12 PM   Specimen: BLOOD  Result Value Ref Range Status   Specimen Description BLOOD LEFT ANTECUBITAL  Final   Special Requests   Final    BOTTLES DRAWN AEROBIC AND ANAEROBIC Blood Culture results may not be optimal due to an  inadequate volume of blood received in culture bottles   Culture   Final    NO GROWTH 3 DAYS Performed at [facility], [address] [place], [place] [address]    Report Status PENDING  Incomplete    Procedures and diagnostic studies:  No results found.             LOS: 3 days   Kysa Calais  [facility]   Pager on [email or web address]. If 7PM-7AM, please contact night-coverage at [email or web address]     09/30/2022, 1:51 PM

## 2022-09-30 NOTE — TOC Initial Note (Addendum)
Transition of Care [facility]) - Initial/Assessment Note    Patient Details  Name: [name] MRN: [id number] Date of Birth: 1970/10/27  Transition of Care [facility] CM/SW Contact:    Beverly Sessions, RN Phone Number: 09/30/2022, 11:47 AM  Clinical Narrative:                  Admitted SAY:[id number] of left lower extremity, chronic venous insufficiency bilateral legs, left leg lymphedema, left ankle ulcer ,  Covid positive Admitted from: Home alone.  Fiance lives locally and is available for support   PCP: [name]  Current home health/prior home health/DME: RW  Therapy recommending home health and bariatric RW.  Patient in agreement.  States he does not have a preference of home health agency.  Referral made to [place] with [facility].   Patient in agreement to bariatric RW.  TOC to wait to order DME until it is determined if patient requires O2 at discharge.  Patient still requiring acute O2.  MD aware  MD to order home health RN PT and OT. And to add wound care instructions to comment section of order    Elvera Bicker dialysis liaison notified of admission.          Patient Goals and CMS Choice            Expected Discharge Plan and Services                                              Prior Living Arrangements/Services                       Activities of Daily Living Home Assistive Devices/Equipment: None ADL Screening (condition at time of admission) Patient's cognitive ability adequate to safely complete daily activities?: No Is the patient deaf or have difficulty hearing?: No Does the patient have difficulty seeing, even when wearing glasses/contacts?: No Does the patient have difficulty concentrating, remembering, or making decisions?: No Patient able to express need for assistance with ADLs?: No Does the patient have difficulty dressing or bathing?: No Independently performs ADLs?: Yes (appropriate for developmental age) Does the patient  have difficulty walking or climbing stairs?: No Weakness of Legs: None Weakness of Arms/Hands: None  Permission Sought/Granted                  Emotional Assessment              Admission diagnosis:  Cellulitis [L03.90] Cellulitis of left lower extremity [L03.116] Pneumonia due to COVID-19 virus [U07.1, J12.82] Patient Active Problem List   Diagnosis Date Noted   Diabetic ulcer of lower leg ([place]) 09/27/2022   COVID-19 virus infection 09/27/2022   Low testosterone 02/01/2021   Screening for malignant neoplasm of prostate 01/28/2021   Hyperprolactinemia ([place]) 07/03/2020   Gout 08/16/2019   End-stage renal disease ([place]) 35/57/3220   Complication of vascular access for dialysis 06/07/2019   Diabetic retinopathy of both eyes ([place]) 04/12/2019   Morbid obesity with BMI of 45.0-49.9, adult ([place]) 04/12/2019   AVF (arteriovenous fistula) ([place]) 02/22/2019   Hyperkalemia 02/20/2019   End stage renal disease ([place]) 05/29/2018   Venous ulcer of ankle, left ([place]) 05/29/2018   Lymphedema 11/03/2017   Cellulitis of left lower extremity 10/01/2017   Chronic venous insufficiency 10/01/2017   Hypertensive disorder 10/01/2017   Hypertension 09/20/2017   Diabetes mellitus  without complication ([place]) 46/50/3546   Swelling of limb 09/20/2017   CKD (chronic kidney disease) stage 5, GFR less than 15 ml/min (HCC) 08/30/2017   PVD (peripheral vascular disease) ([place]) 06/18/2014   Long-term insulin use ([place]) 06/13/2014   Microalbuminuria 03/12/2014   Type 1 diabetes mellitus ([place]) 08/31/2003   PCP:  [name], MD Pharmacy:   [id number] [place] [address] [address]Phone: [phone]: [address]Phone: [phone]: [phone] [facility] [id number][address] [place]- [address] [place]Phone: [phone]: [phone] [address] [place]- [address]Phone: [phone]: [phone] [address] [place] - [address] [address]Phone: [phone]: [address] [address]Phone: [phone]: [phone] [id number] [place], [address] [place]: [phone]: [phone]   Social Determinants of Health (SDOH) Social History: SDOH Screenings   Food Insecurity: No Food Insecurity (09/28/2022)  Housing: Low Risk  (09/28/2022)  Transportation Needs: No Transportation Needs (09/28/2022)  Utilities: Not At Risk (09/28/2022)  Tobacco Use: Medium Risk (09/28/2022)   SDOH Interventions:     Readmission Risk Interventions     No data to display

## 2022-09-30 NOTE — Progress Notes (Signed)
[facility], [place] 09/30/22  Subjective:   LOS: 3  Patient known to our practice from outpatient dialysis.  He was recently diagnosed with COVID-19.  States that he came in because of left leg worsening redness and pain.  He was diagnosed with cellulitis and admitted for IV antibiotics.  Patient resting quietly Alert and oriented Remains on 1L [place]  Objective:  Vital signs in last 24 hours:  Temp:  [97.6 F (36.4 C)-98.9 F (37.2 C)] 98.9 F (37.2 C) (02/01 0903) Pulse Rate:  [52-63] 53 (02/01 0903) Resp:  [18] 18 (02/01 0903) BP: (124-150)/(62-111) 150/79 (02/01 0903) SpO2:  [94 %-97 %] 96 % (02/01 0903)  Weight change:  Filed Weights   09/27/22 1610 09/28/22 1056 09/28/22 1413  Weight: (!) 180 kg (!) 180.4 kg (!) 178.3 kg    Intake/Output:    Intake/Output Summary (Last 24 hours) at 09/30/2022 1329 Last data filed at 09/30/2022 0300 Gross per 24 hour  Intake 440 ml  Output --  Net 440 ml     Physical Exam: General:  No acute distress, laying in the bed  HEENT  anicteric, moist oral mucous membrane  Pulm/lungs  normal breathing effort, lungs are clear to auscultation  CVS/Heart  regular rhythm, no rub or gallop  Abdomen:   Soft, nontender  Extremities:  ++ peripheral edema, left leg bandaged.  Neurologic:  Alert, oriented, able to follow commands  Skin:  No acute rashes  Dialysis access: Left arm AV graft.    Basic Metabolic Panel:  Recent Labs  Lab 09/27/22 1427 09/28/22 0448 09/29/22 0608 09/30/22 0654  NA 132* 134* 135 133*  K 4.6 4.5 5.4* 5.7*  CL 89* 95* 93* 93*  CO2 24 24 21* 23  GLUCOSE 213* 180* 229* 364*  BUN 73* 85* 79* 115*  CREATININE 13.69* 15.07* 13.19* 14.74*  CALCIUM 9.1 8.5* 9.1 9.0  PHOS 5.9*  --   --   --       CBC: Recent Labs  Lab 09/27/22 1427 09/28/22 0448 09/29/22 0608 09/30/22 0654  WBC 19.5* 19.6* 25.0* 20.8*  NEUTROABS  --   --  23.6* 19.5*  HGB 11.7* 10.6* 11.8* 12.2*  HCT 36.0*  33.0* 35.8* 36.1*  MCV 97.3 96.2 95.2 93.3  PLT 208 223 275 314       Lab Results  Component Value Date   HEPBSAG NON REACTIVE 09/28/2022      Microbiology:  Recent Results (from the past 240 hour(s))  Blood culture (routine x 2)     Status: None (Preliminary result)   Collection Time: 09/27/22  5:07 PM   Specimen: BLOOD  Result Value Ref Range Status   Specimen Description BLOOD RIGHT ANTECUBITAL  Final   Special Requests   Final    BOTTLES DRAWN AEROBIC AND ANAEROBIC Blood Culture results may not be optimal due to an inadequate volume of blood received in culture bottles   Culture   Final    NO GROWTH 3 DAYS Performed at [facility], [address], [place],  [address]    Report Status PENDING  Incomplete  Resp panel by RT-PCR (RSV, Flu A&B, Covid) Anterior Nasal Swab     Status: Abnormal   Collection Time: 09/27/22  5:07 PM   Specimen: Anterior Nasal Swab  Result Value Ref Range Status   SARS Coronavirus 2 by RT PCR POSITIVE (A) NEGATIVE Final    Comment: (NOTE) SARS-CoV-2 target nucleic acids are DETECTED.  The SARS-CoV-2 RNA  is generally detectable in upper respiratory specimens during the acute phase of infection. Positive results are indicative of the presence of the identified virus, but do not rule out bacterial infection or co-infection with other pathogens not detected by the test. Clinical correlation with patient history and other diagnostic information is necessary to determine patient infection status. The expected result is Negative.  Fact Sheet for Patients: EntrepreneurPulse.com.au  Fact Sheet for Healthcare Providers: IncredibleEmployment.be  This test is not yet approved or cleared by the [place] FDA and  has been authorized for detection and/or diagnosis of SARS-CoV-2 by FDA under an Emergency Use Authorization (EUA).  This EUA will remain in effect (meaning this test can be used) for  the duration of  the COVID-19 declaration under Section 564(b)(1) of the A ct, 21 U.S.C. section 360bbb-3(b)(1), unless the authorization is terminated or revoked sooner.     Influenza A by PCR NEGATIVE NEGATIVE Final   Influenza B by PCR NEGATIVE NEGATIVE Final    Comment: (NOTE) The Xpert Xpress SARS-CoV-2/FLU/RSV plus assay is intended as an aid in the diagnosis of influenza from Nasopharyngeal swab specimens and should not be used as a sole basis for treatment. Nasal washings and aspirates are unacceptable for Xpert Xpress SARS-CoV-2/FLU/RSV testing.  Fact Sheet for Patients: EntrepreneurPulse.com.au  Fact Sheet for Healthcare Providers: IncredibleEmployment.be  This test is not yet approved or cleared by the [place] FDA and has been authorized for detection and/or diagnosis of SARS-CoV-2 by FDA under an Emergency Use Authorization (EUA). This EUA will remain in effect (meaning this test can be used) for the duration of the COVID-19 declaration under Section 564(b)(1) of the Act, 21 U.S.C. section 360bbb-3(b)(1), unless the authorization is terminated or revoked.     Resp Syncytial Virus by PCR NEGATIVE NEGATIVE Final    Comment: (NOTE) Fact Sheet for Patients: EntrepreneurPulse.com.au  Fact Sheet for Healthcare Providers: IncredibleEmployment.be  This test is not yet approved or cleared by the [place] FDA and has been authorized for detection and/or diagnosis of SARS-CoV-2 by FDA under an Emergency Use Authorization (EUA). This EUA will remain in effect (meaning this test can be used) for the duration of the COVID-19 declaration under Section 564(b)(1) of the Act, 21 U.S.C. section 360bbb-3(b)(1), unless the authorization is terminated or revoked.  Performed at [facility], [address] [place], [place] [address]   Blood culture (routine x 2)     Status: None  (Preliminary result)   Collection Time: 09/27/22  5:12 PM   Specimen: BLOOD  Result Value Ref Range Status   Specimen Description BLOOD LEFT ANTECUBITAL  Final   Special Requests   Final    BOTTLES DRAWN AEROBIC AND ANAEROBIC Blood Culture results may not be optimal due to an inadequate volume of blood received in culture bottles   Culture   Final    NO GROWTH 3 DAYS Performed at [facility], [address] [place], [place] [address]    Report Status PENDING  Incomplete    Coagulation Studies: Recent Labs    09/27/22 1707 09/28/22 0448 09/29/22 0608 09/30/22 0654  LABPROT 28.6* 33.7* 39.4* 39.5*  INR 2.7* 3.4* 4.1* 4.1*     Urinalysis: No results for input(s): "COLORURINE", "LABSPEC", "PHURINE", "GLUCOSEU", "HGBUR", "BILIRUBINUR", "KETONESUR", "PROTEINUR", "UROBILINOGEN", "NITRITE", "LEUKOCYTESUR" in the last 72 hours.  Invalid input(s): "APPERANCEUR"    Imaging: No results found.   Medications:    cefTRIAXone (ROCEPHIN)  IV 1 g (09/29/22 1455)   vancomycin 1,000  mg (09/28/22 1454)    allopurinol  100 mg Oral Daily   bromocriptine  2.5 mg Oral QHS   calcium acetate  2,001 mg Oral TID WC   Chlorhexidine Gluconate Cloth  6 each Topical Q0600   cinacalcet  30 mg Oral Q breakfast   dexamethasone (DECADRON) injection  6 mg Intravenous Q24H   insulin aspart  0-15 Units Subcutaneous TID WC   insulin aspart  0-5 Units Subcutaneous QHS   insulin glargine-yfgn  20 Units Subcutaneous QHS   multivitamin  1 tablet Oral Once per day on Mon Wed Fri   sodium chloride flush  3 mL Intravenous Q12H   Warfarin - Pharmacist Dosing Inpatient   Does not apply q1600   acetaminophen **OR** acetaminophen, amLODipine, atenolol, lidocaine-prilocaine, melatonin, oxyCODONE, polyvinyl alcohol  Assessment/ Plan:  52 y.o. male with end-stage renal disease on hemodialysis, obstructive sleep apnea, insulin-dependent diabetes, hypertension, hyperlipidemia, morbid obesity, left lower  extremity lymphedema, chronic venous insufficiency was admitted on 09/27/2022 for  Principal Problem:   Cellulitis of left lower extremity Active Problems:   Diabetes mellitus without complication (HCC)   Chronic venous insufficiency   Venous ulcer of ankle, left (HCC)   CKD (chronic kidney disease) stage 5, GFR less than 15 ml/min (HCC)   Morbid obesity with BMI of 45.0-49.9, adult (HCC)   End-stage renal disease ([place])   Diabetic ulcer of lower leg ([place])   COVID-19 virus infection  Cellulitis [L03.90] Cellulitis of left lower extremity [L03.116] Pneumonia due to COVID-19 virus [U07.1, J12.82]  #. ESRD [place] dialysis/TTS 1/ EDW 180 kg Plan to continue TTS schedule while in the hospital. Planning for dialysis today, will perform at bedside due to positive Covid 19 status. Next treatment scheduled for Saturday.  Renal navigator aware of patient and will monitor outpatient needs for dialysis.    #. Anemia of CKD  Lab Results  Component Value Date   HGB 12.2 (L) 09/30/2022  Hemoglobin within desired range.  Will consider low dose EPO with HD for hemoglobin less than 10.  #. Secondary hyperparathyroidism of renal origin N 25.81   No results found for: "PTH" Lab Results  Component Value Date   PHOS 5.9 (H) 09/27/2022   Calcium remains acceptable. Will continue to monitor bone minerals.    #. Diabetes type 2 with CKD Hgb A1c MFr Bld (%)  Date Value  09/27/2022 8.8 (H)   Glucose elevated during this admission, likely due to IV steroids. Primary team to manage with sliding scale insulin  #Left leg cellulitis, with underlying lymphedema. Continue vancomycin and Rocephin, per primary team  Covid -19 Supportive care      LOS: [place] 2/1/20241:29 PM  [place], [place]

## 2022-09-30 NOTE — Consult Note (Signed)
Pharmacy Antibiotic Note  [name] is a 52 y.o. male admitted on 09/27/2022 with cellulitis. PMH significant for T1DM, ESRD on HD TTS, HTN, PVD, history of DVT on warfarin, lymphedema. Pharmacy has been consulted for vancomycin dosing.  Plan: Day 4 of antibiotics Give vancomycin 1000 mg IV QHD (TTS). Goal trough 15-25 mcg/mL Plan to check vancomycin level prior to 4th maintenance dose (anticipate 10/05/22) Patient is also on ceftriaxone 1g IV Q24H Continue to monitor renal function and follow culture results   Height: '6\' 4"'$  (193 cm) Weight: (!) 178.3 kg (393 lb 1.3 oz) IBW/kg (Calculated) : 86.8  Temp (24hrs), Avg:97.8 F (36.6 C), Min:97.6 F (36.4 C), Max:98 F (36.7 C)  Recent Labs  Lab 09/27/22 1427 09/27/22 1707 09/28/22 0448 09/29/22 0608 09/30/22 0654  WBC 19.5*  --  19.6* 25.0* 20.8*  CREATININE 13.69*  --  15.07* 13.19*  --   LATICACIDVEN  --  1.5  --   --   --   VANCORANDOM  --   --  23  --   --      Estimated Creatinine Clearance: 11.4 mL/min (A) (by C-G formula based on SCr of 13.19 mg/dL (H)).    Allergies  Allergen Reactions   Lisinopril Swelling    Lips mouth    Furosemide Cough    Light headed, blurry vision, weakness.   Latex    Adhesive [Tape] Itching and Rash   Antimicrobials this admission: 1/29 Remdesivir >> 1/31 1/29 Vancomycin >>  1/30 Ceftriaxone >>  Microbiology results: 1/29 BCx: NG3D 1/29 COVID PCR: positive   Thank you for allowing pharmacy to be a part of this patient's care.  Gretel Acre, PharmD PGY1 Pharmacy Resident 09/30/2022 7:54 AM

## 2022-09-30 NOTE — Consult Note (Incomplete)
ANTICOAGULATION CONSULT NOTE - Follow Up Consult  Pharmacy Consult for Warfarin Indication: DVT  Allergies  Allergen Reactions   Lisinopril Swelling    Lips mouth    Furosemide Cough    Light headed, blurry vision, weakness.   Latex    Adhesive [Tape] Itching and Rash    Patient Measurements: Height: '6\' 4"'$  (193 cm) Weight: (!) 178.3 kg (393 lb 1.3 oz) IBW/kg (Calculated) : 86.8   Vital Signs: Temp: 98.9 F (37.2 C) (02/01 0903) Temp Source: Oral (02/01 0511) BP: 150/79 (02/01 0903) Pulse Rate: 53 (02/01 0903)  Labs: Recent Labs    09/28/22 0448 09/29/22 4401 09/30/22 0654  HGB 10.6* 11.8* 12.2*  HCT 33.0* 35.8* 36.1*  PLT 223 275 314  LABPROT 33.7* 39.4* 39.5*  INR 3.4* 4.1* 4.1*  CREATININE 15.07* 13.19* 14.74*     Estimated Creatinine Clearance: 10.2 mL/min (A) (by C-G formula based on SCr of 14.74 mg/dL (H)).   Medications:  Facility-Administered Medications Prior to Admission  Medication Dose Route Frequency Provider Last Rate Last Admin   [DISCONTINUED] 0.9 %  sodium chloride infusion   Intravenous PRN Causey, Charlestine Massed, NP       Medications Prior to Admission  Medication Sig Dispense Refill Last Dose   allopurinol (ZYLOPRIM) 100 MG tablet Take 100 mg by mouth daily.   09/26/2022   B Complex-C-Folic Acid (RENA-VITE RX) 1 MG TABS Take 1 tablet by mouth 3 (three) times a week.    Past Week   bromocriptine (PARLODEL) 2.5 MG tablet TAKE 1 TABLET BY MOUTH AT BEDTIME. 90 tablet 3 Past Month   calcium acetate (PHOSLO) 667 MG capsule Take 1,334-2,001 mg by mouth See admin instructions. Take 2001 mg by mouth with each meal & take 1334 mg by mouth with each snack.   09/26/2022   cinacalcet (SENSIPAR) 30 MG tablet Take 30 mg by mouth daily.   09/26/2022   CINNAMON PO Take 500 mg by mouth 2 (two) times daily.    09/26/2022   Insulin Glargine (LANTUS SOLOSTAR) 100 UNIT/ML Solostar Pen Inject 15 Units into the skin at bedtime.    09/26/2022   Multiple Vitamin  (MULTIVITAMIN WITH MINERALS) TABS tablet Take 1 tablet by mouth 3 (three) times a week. Men's Multivitamin   Past Week   Multiple Vitamins-Minerals (EYE HEALTH PO) Take 1 capsule by mouth daily.    09/26/2022   warfarin (COUMADIN) 5 MG tablet Take 10 mg by mouth daily.    09/26/2022   amLODipine (NORVASC) 10 MG tablet Take 5 mg by mouth daily as needed (if bp is 190/90 or higher).    prn at prn   atenolol (TENORMIN) 50 MG tablet Take 25 mg by mouth daily as needed (if bp is 190/90 or higher).    prn at prn   ibuprofen (ADVIL) 200 MG tablet Take 200 mg by mouth every 6 (six) hours as needed for headache or moderate pain.   prn at prn   lidocaine-prilocaine (EMLA) cream Apply 1 application topically as needed (port access).   prn at prn   loratadine (CLARITIN) 10 MG tablet Take 10 mg by mouth daily as needed for allergies.   prn at prn   melatonin 5 MG TABS Take 5 mg by mouth at bedtime as needed (sleep).    prn at prn   Polyethyl Glycol-Propyl Glycol (SYSTANE OP) Place 1 drop into both eyes daily as needed (dry eyes).      sildenafil (VIAGRA) 100 MG tablet Take 1  tablet (100 mg total) by mouth daily as needed for erectile dysfunction. 30 tablet 3 prn at prn   traMADol (ULTRAM) 50 MG tablet Take 1 tablet (50 mg total) by mouth at bedtime as needed for moderate pain or severe pain. 25 tablet 0 prn at prn    Assessment: [name] is a 52 YO M with a PMH of ESRD on dialysis, DM, HLN, and lymphedema of the left LE with prior remote leg cellulitis. Was diagnosed to have progressive cellulitis, DVT, necrotizing infection, osteomyelitis and positive for COVID. [name] was on warfarin previously for blood clots during dialysis treatment.   Goal of Therapy:  INR 2-3 Monitor platelets by anticoagulation protocol: Yes   Plan:  1/29: '10mg'$  PO x 1  1/30: HOLD dose  1/31: INR: 3.4>4.1.HOLD dose 2/01: INR: 4.1>>4.1  Hold dose. Check INR with am labs   [name], Pharmacy Student  09/30/2022,9:17 AM

## 2022-09-30 NOTE — Inpatient Diabetes Management (Signed)
Inpatient Diabetes Program Recommendations  AACE/ADA: New Consensus Statement on Inpatient Glycemic Control   Target Ranges:  Prepandial:   less than 140 mg/dL      Peak postprandial:   less than 180 mg/dL (1-2 hours)      Critically ill patients:  140 - 180 mg/dL    Latest Reference Range & Units 09/30/22 06:54  Glucose 70 - 99 mg/dL 364 (H)    Latest Reference Range & Units 09/29/22 07:44 09/29/22 11:39 09/29/22 16:21 09/29/22 20:39  Glucose-Capillary 70 - 99 mg/dL 250 (H) 298 (H) 297 (H) 267 (H)   Review of Glycemic Control  Diabetes history: DM2 Outpatient Diabetes medications: Lantus 15 units QHS Current orders for Inpatient glycemic control: Semglee 20 units QHS, Novolog 0-15 units TID with meals, Novolog 0-5 units QHS; Decadron 6 mg Q24H   Inpatient Diabetes Program Recommendations:     Insulin: If steroids are continued, please consider increasing Semglee to 30 units QHS and ordering Novolog 6 units TID with meals for meal coverage if patient eats at least 50% of meals.   Thanks, Barnie Alderman, RN, MSN, [place] Diabetes Coordinator Inpatient Diabetes Program [phone] (Team Pager from 8am to [place])

## 2022-09-30 NOTE — Progress Notes (Signed)
PT did 2.5 hrs of HD, tolerated well with no signs of distress  UF = 1500 ml    09/30/22 1747  Vitals  Temp 97.6 F (36.4 C)  Temp Source Oral  BP (!) 142/77  MAP (mmHg) 98  BP Location Right Wrist  BP Method Automatic  Patient Position (if appropriate) Lying  Pulse Rate 60  Pulse Rate Source Dinamap  Resp 18  Oxygen Therapy  SpO2 99 %  O2 Device Nasal Cannula  O2 Flow Rate (L/min) 2 L/min  Post Treatment  Dialyzer Clearance Clear  Duration of HD Treatment -hour(s) 2.5 hour(s)  Hemodialysis Intake (mL) 0 mL  Liters Processed 60  Fluid Removed (mL) 1500 mL  Tolerated HD Treatment Yes  AVG/AVF Arterial Site Held (minutes) 10 minutes  AVG/AVF Venous Site Held (minutes) 10 minutes

## 2022-09-30 NOTE — Progress Notes (Signed)
Physical Therapy Treatment Patient Details Name: [name] MRN: [id number] DOB: 12-16-1970 Today's Date: 09/30/2022   History of Present Illness [name] is a 52 y.o. male past medical history of end-stage renal disease on dialysis, diabetes hyperlipidemia peripheral vascular disease lymphedema who presents with concern for cellulitis.  Patient tells me that for about a week he has had left lower extremity redness and swelling and pain.  Has had an ulcer on that leg for some time but seem to be improving prior to the onset of this redness.  Tells me left leg is usually more swollen than the right but it is much more swollen today.  He is also had cough body aches decreased appetite and at dialysis had positive COVID test on Saturday.  Last dialysis was Saturday was full session.  Denies increasing dyspnea from baseline.    PT Comments    Pt received long sitting in bed. Pt progressing at mod-I level with bed mobility standing at supervision to BRW completing > 200' of gait. Pt tolerating RA initially during gait bout at 90-91%but with seated rest on RA post gait, pt desaturates to low 80's requiring 2 L/min to return to > 95% after VC's for PLB. Pt left sitting EoB at end of session. Reviewed need for stair training when LLE less painful. Encouraged PLB and benefits for O2 sats. Pt verbalized understanding. Pt remains close to baseline but does require BRW to offload LLE in stance phase of gait due to pain. Pt with all needs in reach, RN updated on O2 needs and mobility level. [facility].    Recommendations for follow up therapy are one component of a multi-disciplinary discharge planning process, led by the attending physician.  Recommendations may be updated based on patient status, additional functional criteria and insurance authorization.  Follow Up Recommendations  Home health PT     Assistance Recommended at Discharge Intermittent Supervision/Assistance   Patient can return home with the following A little help with walking and/or transfers;A little help with bathing/dressing/bathroom;Assistance with cooking/housework;Assist for transportation;Help with stairs or ramp for entrance   Equipment Recommendations   (Bariatric RW)    Recommendations for Other Services       Precautions / Restrictions Precautions Precautions: Fall Precaution Comments: BRW Restrictions Weight Bearing Restrictions: No     Mobility  Bed Mobility Overal bed mobility: Modified Independent             General bed mobility comments: increased time Patient Response: Cooperative  Transfers Overall transfer level: Needs assistance Equipment used: Rolling walker (2 wheels) Transfers: Sit to/from Stand Sit to Stand: Supervision           General transfer comment: bouts of momentum    Ambulation/Gait Ambulation/Gait assistance: Supervision Gait Distance (Feet): 220 Feet Assistive device: Rolling walker (2 wheels) Gait Pattern/deviations: Step-through pattern, Decreased step length - right, Decreased stance time - left, Antalgic       General Gait Details: L antalgic gait utilizing BUE support.   Stairs             Wheelchair Mobility    Modified Rankin (Stroke Patients Only)       Balance Overall balance assessment: Needs assistance Sitting-balance support: Bilateral upper extremity supported, Feet supported Sitting balance-Leahy Scale: Good     Standing balance support: Reliant on assistive device for balance Standing balance-Leahy Scale: Fair Standing balance comment: without UE on RW  Cognition Arousal/Alertness: Awake/alert Behavior During Therapy: WFL for tasks assessed/performed Overall Cognitive Status: Within Functional Limits for tasks assessed                                          Exercises      General Comments        Pertinent Vitals/Pain  Pain Assessment Pain Assessment: No/denies pain Pain Location: L lower leg and foot Pain Descriptors / Indicators: Grimacing Pain Intervention(s): Limited activity within patient's tolerance    Home Living                          Prior Function            PT Goals (current goals can now be found in the care plan section) Acute Rehab PT Goals Patient Stated Goal: To return home PT Goal Formulation: With patient Time For Goal Achievement: 10/13/22 Potential to Achieve Goals: Good Progress towards PT goals: Progressing toward goals    Frequency    Min 2X/week      PT Plan Current plan remains appropriate    Co-evaluation              AM-PAC PT "6 Clicks" Mobility   Outcome Measure  Help needed turning from your back to your side while in a flat bed without using bedrails?: A Little Help needed moving from lying on your back to sitting on the side of a flat bed without using bedrails?: A Little Help needed moving to and from a bed to a chair (including a wheelchair)?: A Little Help needed standing up from a chair using your arms (e.g., wheelchair or bedside chair)?: A Little Help needed to walk in hospital room?: A Little Help needed climbing 3-5 steps with a railing? : A Lot 6 Click Score: 17    End of Session Equipment Utilized During Treatment: Gait belt Activity Tolerance: Patient tolerated treatment well Patient left: in bed;with call bell/phone within reach;with bed alarm set Nurse Communication: Mobility status PT Visit Diagnosis: Other abnormalities of gait and mobility (R26.89);Pain Pain - Right/Left: Left Pain - part of body: Leg;Ankle and joints of foot     Time: 2130-8657 PT Time Calculation (min) (ACUTE ONLY): 34 min  Charges:  $Therapeutic Exercise: 23-37 mins                    Fraida Veldman M. Fairly IV, PT, DPT Physical Therapist- [facility]  09/30/2022, 1:23 PM

## 2022-09-30 NOTE — Progress Notes (Signed)
Critical lab value INR 4.1, Dr. Mal Misty and pharmacist notified via secure chat

## 2022-10-01 DIAGNOSIS — N186 End stage renal disease: Secondary | ICD-10-CM | POA: Diagnosis not present

## 2022-10-01 DIAGNOSIS — L03116 Cellulitis of left lower limb: Secondary | ICD-10-CM | POA: Diagnosis not present

## 2022-10-01 DIAGNOSIS — U071 COVID-19: Secondary | ICD-10-CM | POA: Diagnosis not present

## 2022-10-01 LAB — BASIC METABOLIC PANEL
Anion gap: 16 — ABNORMAL HIGH (ref 5–15)
BUN: 104 mg/dL — ABNORMAL HIGH (ref 6–20)
CO2: 25 mmol/L (ref 22–32)
Calcium: 8.7 mg/dL — ABNORMAL LOW (ref 8.9–10.3)
Chloride: 93 mmol/L — ABNORMAL LOW (ref 98–111)
Creatinine, Ser: 12.67 mg/dL — ABNORMAL HIGH (ref 0.61–1.24)
GFR, Estimated: 4 mL/min — ABNORMAL LOW (ref >60)
Glucose, Bld: 249 mg/dL — ABNORMAL HIGH (ref 70–99)
Potassium: 5.4 mmol/L — ABNORMAL HIGH (ref 3.5–5.1)
Sodium: 134 mmol/L — ABNORMAL LOW (ref 135–145)

## 2022-10-01 LAB — CBC WITH DIFFERENTIAL/PLATELET
Abs Immature Granulocytes: 0.28 10*3/uL — ABNORMAL HIGH (ref 0.00–0.07)
Basophils Absolute: 0 10*3/uL (ref 0.0–0.1)
Basophils Relative: 0 %
Eosinophils Absolute: 0 10*3/uL (ref 0.0–0.5)
Eosinophils Relative: 0 %
HCT: 32.6 % — ABNORMAL LOW (ref 39.0–52.0)
Hemoglobin: 11.1 g/dL — ABNORMAL LOW (ref 13.0–17.0)
Immature Granulocytes: 1 %
Lymphocytes Relative: 3 %
Lymphs Abs: 0.6 10*3/uL — ABNORMAL LOW (ref 0.7–4.0)
MCH: 31.2 pg (ref 26.0–34.0)
MCHC: 34 g/dL (ref 30.0–36.0)
MCV: 91.6 fL (ref 80.0–100.0)
Monocytes Absolute: 0.5 10*3/uL (ref 0.1–1.0)
Monocytes Relative: 3 %
Neutro Abs: 18.2 10*3/uL — ABNORMAL HIGH (ref 1.7–7.7)
Neutrophils Relative %: 93 %
Platelets: 334 10*3/uL (ref 150–400)
RBC: 3.56 MIL/uL — ABNORMAL LOW (ref 4.22–5.81)
RDW: 13.2 % (ref 11.5–15.5)
WBC: 19.6 10*3/uL — ABNORMAL HIGH (ref 4.0–10.5)
nRBC: 0 % (ref 0.0–0.2)

## 2022-10-01 LAB — GLUCOSE, CAPILLARY
Glucose-Capillary: 276 mg/dL — ABNORMAL HIGH (ref 70–99)
Glucose-Capillary: 286 mg/dL — ABNORMAL HIGH (ref 70–99)
Glucose-Capillary: 221 mg/dL — ABNORMAL HIGH (ref 70–99)
Glucose-Capillary: 233 mg/dL — ABNORMAL HIGH (ref 70–99)

## 2022-10-01 LAB — PROTIME-INR
INR: 3.3 — ABNORMAL HIGH (ref 0.8–1.2)
Prothrombin Time: 33.6 seconds — ABNORMAL HIGH (ref 11.4–15.2)

## 2022-10-01 MED ORDER — BISACODYL 10 MG RE SUPP: 10.0000 mg | Freq: Every day | RECTAL | Status: DC | PRN

## 2022-10-01 MED ORDER — SODIUM ZIRCONIUM CYCLOSILICATE 10 G PO PACK
10.0000 g | PACK | Freq: Once | ORAL | Status: AC
  Administered 2022-10-01: 10 g via ORAL
  Filled 2022-10-01: qty 1

## 2022-10-01 MED ORDER — POLYETHYLENE GLYCOL 3350 17 G PO PACK
17.0000 g | PACK | Freq: Every day | ORAL | Status: DC
  Administered 2022-10-01 – 2022-10-05 (×5): 17 g via ORAL
  Filled 2022-10-01 (×5): qty 1

## 2022-10-01 MED ORDER — WARFARIN SODIUM 2.5 MG PO TABS
2.5000 mg | ORAL_TABLET | Freq: Once | ORAL | Status: AC
  Administered 2022-10-01: 2.5 mg via ORAL
  Filled 2022-10-01: qty 1

## 2022-10-01 MED ORDER — INSULIN GLARGINE-YFGN 100 UNIT/ML ~~LOC~~ SOLN
20.0000 [IU] | Freq: Every day | SUBCUTANEOUS | Status: DC
  Administered 2022-10-01 – 2022-10-04 (×4): 20 [IU] via SUBCUTANEOUS
  Filled 2022-10-01 (×5): qty 0.2

## 2022-10-01 MED ORDER — SENNOSIDES-DOCUSATE SODIUM 8.6-50 MG PO TABS
1.0000 | ORAL_TABLET | Freq: Two times a day (BID) | ORAL | Status: DC | PRN
  Administered 2022-10-01: 1 via ORAL
  Filled 2022-10-01: qty 1

## 2022-10-01 NOTE — Progress Notes (Signed)
[facility], [place] 10/01/22  Subjective:   LOS: 4  Patient known to our practice from outpatient dialysis.  He was recently diagnosed with COVID-19.  States that he came in because of left leg worsening redness and pain.  He was diagnosed with cellulitis and admitted for IV antibiotics.  Patient seen sitting at side of bed, alert and oriented No family at bedside Patient states he feels well, room air Continues to complain of mild pain from lower extremity Tolerated dialysis well yesterday  Objective:  Vital signs in last 24 hours:  Temp:  [97.4 F (36.3 C)-98 F (36.7 C)] 97.7 F (36.5 C) (02/02 0832) Pulse Rate:  [52-103] 103 (02/02 0832) Resp:  [15-20] 20 (02/02 0832) BP: (108-144)/(43-80) 136/74 (02/02 0832) SpO2:  [93 %-100 %] 96 % (02/02 0832) Weight:  [175.3 kg-177.4 kg] 175.3 kg (02/01 1753)  Weight change:  Filed Weights   09/28/22 1413 09/30/22 1439 09/30/22 1753  Weight: (!) 178.3 kg (!) 177.4 kg (!) 175.3 kg    Intake/Output:    Intake/Output Summary (Last 24 hours) at 10/01/2022 1347 Last data filed at 09/30/2022 1747 Gross per 24 hour  Intake --  Output 1500 ml  Net -1500 ml     Physical Exam: General:  No acute distress, laying in the bed  HEENT  anicteric, moist oral mucous membrane  Pulm/lungs  normal breathing effort, lungs are clear to auscultation  CVS/Heart  regular rhythm, no rub or gallop  Abdomen:   Soft, nontender  Extremities:  ++ peripheral edema, left leg bandaged.  Neurologic:  Alert, oriented, able to follow commands  Skin:  No acute rashes  Dialysis access: Left arm AV graft.    Basic Metabolic Panel:  Recent Labs  Lab 09/27/22 1427 09/28/22 0448 09/29/22 0608 09/30/22 0654 09/30/22 2210 10/01/22 0351  NA 132* 134* 135 133* 133* 134*  K 4.6 4.5 5.4* 5.7* 4.9 5.4*  CL 89* 95* 93* 93* 93* 93*  CO2 24 24 21* '23 24 25  '$ GLUCOSE 213* 180* 229* 364* 242* 249*  BUN 73* 85* 79* 115* 93* 104*   CREATININE 13.69* 15.07* 13.19* 14.74* 11.94* 12.67*  CALCIUM 9.1 8.5* 9.1 9.0 8.8* 8.7*  PHOS 5.9*  --   --   --  6.5*  --       CBC: Recent Labs  Lab 09/28/22 0448 09/29/22 0608 09/30/22 0654 09/30/22 1726 10/01/22 0351  WBC 19.6* 25.0* 20.8* 24.8* 19.6*  NEUTROABS  --  23.6* 19.5*  --  18.2*  HGB 10.6* 11.8* 12.2* 11.6* 11.1*  HCT 33.0* 35.8* 36.1* 32.9* 32.6*  MCV 96.2 95.2 93.3 89.9 91.6  PLT 223 275 314 340 334       Lab Results  Component Value Date   HEPBSAG NON REACTIVE 09/28/2022      Microbiology:  Recent Results (from the past 240 hour(s))  Blood culture (routine x 2)     Status: None (Preliminary result)   Collection Time: 09/27/22  5:07 PM   Specimen: BLOOD  Result Value Ref Range Status   Specimen Description BLOOD RIGHT ANTECUBITAL  Final   Special Requests   Final    BOTTLES DRAWN AEROBIC AND ANAEROBIC Blood Culture results may not be optimal due to an inadequate volume of blood received in culture bottles   Culture   Final    NO GROWTH 4 DAYS Performed at [facility], [address] [place], [place] [address]    Report Status PENDING  Incomplete  Resp panel by RT-PCR (RSV, Flu A&B, Covid) Anterior Nasal Swab     Status: Abnormal   Collection Time: 09/27/22  5:07 PM   Specimen: Anterior Nasal Swab  Result Value Ref Range Status   SARS Coronavirus 2 by RT PCR POSITIVE (A) NEGATIVE Final    Comment: (NOTE) SARS-CoV-2 target nucleic acids are DETECTED.  The SARS-CoV-2 RNA is generally detectable in upper respiratory specimens during the acute phase of infection. Positive results are indicative of the presence of the identified virus, but do not rule out bacterial infection or co-infection with other pathogens not detected by the test. Clinical correlation with patient history and other diagnostic information is necessary to determine patient infection status. The expected result is Negative.  Fact Sheet for  Patients: EntrepreneurPulse.com.au  Fact Sheet for Healthcare Providers: IncredibleEmployment.be  This test is not yet approved or cleared by the [place] FDA and  has been authorized for detection and/or diagnosis of SARS-CoV-2 by FDA under an Emergency Use Authorization (EUA).  This EUA will remain in effect (meaning this test can be used) for the duration of  the COVID-19 declaration under Section 564(b)(1) of the A ct, 21 U.S.C. section 360bbb-3(b)(1), unless the authorization is terminated or revoked sooner.     Influenza A by PCR NEGATIVE NEGATIVE Final   Influenza B by PCR NEGATIVE NEGATIVE Final    Comment: (NOTE) The Xpert Xpress SARS-CoV-2/FLU/RSV plus assay is intended as an aid in the diagnosis of influenza from Nasopharyngeal swab specimens and should not be used as a sole basis for treatment. Nasal washings and aspirates are unacceptable for Xpert Xpress SARS-CoV-2/FLU/RSV testing.  Fact Sheet for Patients: EntrepreneurPulse.com.au  Fact Sheet for Healthcare Providers: IncredibleEmployment.be  This test is not yet approved or cleared by the [place] FDA and has been authorized for detection and/or diagnosis of SARS-CoV-2 by FDA under an Emergency Use Authorization (EUA). This EUA will remain in effect (meaning this test can be used) for the duration of the COVID-19 declaration under Section 564(b)(1) of the Act, 21 U.S.C. section 360bbb-3(b)(1), unless the authorization is terminated or revoked.     Resp Syncytial Virus by PCR NEGATIVE NEGATIVE Final    Comment: (NOTE) Fact Sheet for Patients: EntrepreneurPulse.com.au  Fact Sheet for Healthcare Providers: IncredibleEmployment.be  This test is not yet approved or cleared by the [place] FDA and has been authorized for detection and/or diagnosis of SARS-CoV-2 by FDA under an Emergency Use  Authorization (EUA). This EUA will remain in effect (meaning this test can be used) for the duration of the COVID-19 declaration under Section 564(b)(1) of the Act, 21 U.S.C. section 360bbb-3(b)(1), unless the authorization is terminated or revoked.  Performed at [facility], [address] [place], [place] [address]   Blood culture (routine x 2)     Status: None (Preliminary result)   Collection Time: 09/27/22  5:12 PM   Specimen: BLOOD  Result Value Ref Range Status   Specimen Description BLOOD LEFT ANTECUBITAL  Final   Special Requests   Final    BOTTLES DRAWN AEROBIC AND ANAEROBIC Blood Culture results may not be optimal due to an inadequate volume of blood received in culture bottles   Culture   Final    NO GROWTH 4 DAYS Performed at [facility], [address] [place], [place] [address]    Report Status PENDING  Incomplete    Coagulation Studies: Recent Labs    09/29/22 0608 09/30/22 0654 10/01/22 0351  LABPROT 39.4* 39.5*  33.6*  INR 4.1* 4.1* 3.3*     Urinalysis: No results for input(s): "COLORURINE", "LABSPEC", "PHURINE", "GLUCOSEU", "HGBUR", "BILIRUBINUR", "KETONESUR", "PROTEINUR", "UROBILINOGEN", "NITRITE", "LEUKOCYTESUR" in the last 72 hours.  Invalid input(s): "APPERANCEUR"    Imaging: No results found.   Medications:    cefTRIAXone (ROCEPHIN)  IV 1 g (09/30/22 1827)   vancomycin 1,000 mg (09/30/22 1629)    allopurinol  100 mg Oral Daily   bromocriptine  2.5 mg Oral QHS   calcium acetate  2,001 mg Oral TID WC   Chlorhexidine Gluconate Cloth  6 each Topical Q0600   cinacalcet  30 mg Oral Q breakfast   dexamethasone (DECADRON) injection  6 mg Intravenous Q24H   insulin aspart  0-15 Units Subcutaneous TID WC   insulin aspart  0-5 Units Subcutaneous QHS   insulin aspart  6 Units Subcutaneous TID WC   insulin glargine-yfgn  30 Units Subcutaneous QHS   multivitamin  1 tablet Oral Once per day on Mon Wed Fri   polyethylene  glycol  17 g Oral Daily   sodium chloride flush  3 mL Intravenous Q12H   Warfarin - Pharmacist Dosing Inpatient   Does not apply q1600   acetaminophen **OR** acetaminophen, amLODipine, atenolol, bisacodyl, lidocaine-prilocaine, melatonin, oxyCODONE, polyvinyl alcohol, senna-docusate  Assessment/ Plan:  52 y.o. male with end-stage renal disease on hemodialysis, obstructive sleep apnea, insulin-dependent diabetes, hypertension, hyperlipidemia, morbid obesity, left lower extremity lymphedema, chronic venous insufficiency was admitted on 09/27/2022 for  Principal Problem:   Cellulitis of left lower extremity Active Problems:   Diabetes mellitus with hyperglycemia (HCC)   Chronic venous insufficiency   Venous ulcer of ankle, left (HCC)   CKD (chronic kidney disease) stage 5, GFR less than 15 ml/min (HCC)   Morbid obesity with BMI of 45.0-49.9, adult (HCC)   End-stage renal disease ([place])   Diabetic ulcer of lower leg ([place])   COVID-19 virus infection  Cellulitis [L03.90] Cellulitis of left lower extremity [L03.116] Pneumonia due to COVID-19 virus [U07.1, J12.82]  #. ESRD [facility]/TTS / EDW 180 kg Plan to continue TTS schedule while in the hospital. Dialysis received yesterday, UF 1.5 L achieved.  Next treatment scheduled for Saturday.   #. Anemia of CKD  Lab Results  Component Value Date   HGB 11.1 (L) 10/01/2022  Hemoglobin acceptable. No need for ESA's at this time.  #. Secondary hyperparathyroidism of renal origin N 25.81   No results found for: "PTH" Lab Results  Component Value Date   PHOS 6.5 (H) 09/30/2022   Phosphorus slightly elevated, continue calcium acetate with meals.  Calcium remains acceptable.   #. Diabetes type 2 with CKD Hgb A1c MFr Bld (%)  Date Value  09/27/2022 8.8 (H)   Glucose elevated likely due to IV steroids. Primary team to manage with sliding scale insulin  #Left leg cellulitis, with underlying  lymphedema. Continue vancomycin with dialysis and Rocephin, per primary team  Covid -19 Supportive care      LOS: [place] 2/2/20241:47 PM  [place], [place]

## 2022-10-01 NOTE — Progress Notes (Signed)
Physical Therapy Treatment Patient Details Name: [name] MRN: [id number] DOB: 21-Jan-1971 Today's Date: 10/01/2022   History of Present Illness Adrienne A Zumbro is a 52 y.o. male past medical history of end-stage renal disease on dialysis, diabetes hyperlipidemia peripheral vascular disease lymphedema who presents with concern for cellulitis.  Patient tells me that for about a week he has had left lower extremity redness and swelling and pain.  Has had an ulcer on that leg for some time but seem to be improving prior to the onset of this redness.  Tells me left leg is usually more swollen than the right but it is much more swollen today.  He is also had cough body aches decreased appetite and at dialysis had positive COVID test on Saturday.  Last dialysis was Saturday was full session.  Denies increasing dyspnea from baseline.    PT Comments    Pt received upright in bed agreeable to PT. Pt on phone call for ~10 minutes prior to beginning of session (unbilled time). Pt stands supervision, ambulating > 200' supervision and is able to asc/desc 7 steps demonstrating safe ability to enter/exit home. Pt returning to room maintaining SPO2 > 90% on RA throughout session. Encouraged frequent OOB mobility with nursing staff. Pt understanding. All needs in reach and will continue to rec HHPT to wean off of AD and return to PLOF.    Recommendations for follow up therapy are one component of a multi-disciplinary discharge planning process, led by the attending physician.  Recommendations may be updated based on patient status, additional functional criteria and insurance authorization.  Follow Up Recommendations  Home health PT     Assistance Recommended at Discharge Intermittent Supervision/Assistance  Patient can return home with the following A little help with walking and/or transfers;A little help with bathing/dressing/bathroom;Assistance with cooking/housework;Assist for transportation;Help with stairs  or ramp for entrance   Equipment Recommendations   (Bariatric RW)    Recommendations for Other Services       Precautions / Restrictions Precautions Precautions: Fall Precaution Comments: BRW Restrictions Weight Bearing Restrictions: No     Mobility  Bed Mobility Overal bed mobility: Modified Independent Bed Mobility: Supine to Sit     Supine to sit: HOB elevated, Supervision       Patient Response: Cooperative  Transfers Overall transfer level: Needs assistance Equipment used: Rolling walker (2 wheels) Transfers: Sit to/from Stand Sit to Stand: Supervision                Ambulation/Gait Ambulation/Gait assistance: Supervision Gait Distance (Feet): 240 Feet Assistive device: Rolling walker (2 wheels) Gait Pattern/deviations: Step-through pattern, Decreased step length - right, Decreased stance time - left       General Gait Details: Normalized gait pattern with light BRW use   Stairs Stairs: Yes Stairs assistance: Min guard Stair Management: Alternating pattern, Forwards, Two rails Number of Stairs: 7 General stair comments: asc/desc without issue. Reports at baseline with steps.   Wheelchair Mobility    Modified Rankin (Stroke Patients Only)       Balance Overall balance assessment: Needs assistance Sitting-balance support: Bilateral upper extremity supported, Feet supported Sitting balance-Leahy Scale: Good     Standing balance support: Reliant on assistive device for balance Standing balance-Leahy Scale: Fair Standing balance comment: without UE on RW                            Cognition Arousal/Alertness: Awake/alert Behavior During Therapy: [facility] for  tasks assessed/performed Overall Cognitive Status: Within Functional Limits for tasks assessed                                          Exercises      General Comments General comments (skin integrity, edema, etc.): 95-98% on RA at rest. 93% post  ambulation.      Pertinent Vitals/Pain Pain Assessment Pain Assessment: Faces Faces Pain Scale: Hurts a little bit Pain Location: L lower leg and foot Pain Descriptors / Indicators: Grimacing Pain Intervention(s): Limited activity within patient's tolerance    Home Living                          Prior Function            PT Goals (current goals can now be found in the care plan section) Acute Rehab PT Goals Patient Stated Goal: To return home PT Goal Formulation: With patient Time For Goal Achievement: 10/13/22 Potential to Achieve Goals: Good Progress towards PT goals: Progressing toward goals    Frequency    Min 2X/week      PT Plan Current plan remains appropriate    Co-evaluation              AM-PAC PT "6 Clicks" Mobility   Outcome Measure  Help needed turning from your back to your side while in a flat bed without using bedrails?: A Little Help needed moving from lying on your back to sitting on the side of a flat bed without using bedrails?: A Little Help needed moving to and from a bed to a chair (including a wheelchair)?: A Little Help needed standing up from a chair using your arms (e.g., wheelchair or bedside chair)?: A Little Help needed to walk in hospital room?: A Little Help needed climbing 3-5 steps with a railing? : A Little 6 Click Score: 18    End of Session Equipment Utilized During Treatment: Gait belt Activity Tolerance: Patient tolerated treatment well Patient left: in bed;with call bell/phone within reach;with bed alarm set Nurse Communication: Mobility status PT Visit Diagnosis: Other abnormalities of gait and mobility (R26.89);Pain Pain - Right/Left: Left Pain - part of body: Leg;Ankle and joints of foot     Time: 6226-3335 PT Time Calculation (min) (ACUTE ONLY): 29 min  Charges:  $Therapeutic Exercise: 8-22 mins                     Salem Caster. Fairly IV, PT, DPT Physical Therapist- [facility]  10/01/2022, 3:16 PM

## 2022-10-01 NOTE — Inpatient Diabetes Management (Signed)
Inpatient Diabetes Program Recommendations  AACE/ADA: New Consensus Statement on Inpatient Glycemic Control  Target Ranges:  Prepandial:   less than 140 mg/dL      Peak postprandial:   less than 180 mg/dL (1-2 hours)      Critically ill patients:  140 - 180 mg/dL    Latest Reference Range & Units 09/30/22 09:05 09/30/22 11:55 09/30/22 17:41 09/30/22 20:57 10/01/22 08:32  Glucose-Capillary 70 - 99 mg/dL 368 (H) 328 (H) 176 (H) 239 (H) 276 (H)   Review of Glycemic Control  Diabetes history: DM2 Outpatient Diabetes medications: Lantus 15 units QHS Current orders for Inpatient glycemic control: Semglee 30 units QHS, Novolog 0-15 units TID with meals, Novolog 0-5 units QHS, Novolog 6 units TID with meals; Decadron 6 mg Q24H   Inpatient Diabetes Program Recommendations:     Insulin: If steroids are continued, please consider increasing Semglee to 35 units QHS and meal coverage to Novolog 8 units TID with meals if patient eats at least 50% of meals.  Thanks, Barnie Alderman, RN, MSN, [place] Diabetes Coordinator Inpatient Diabetes Program [phone] (Team Pager from 8am to [place])

## 2022-10-01 NOTE — Progress Notes (Addendum)
Progress Note    CHRISTHOPER BUSBEE  XKG:[id number] DOB: 1971/07/31  DOA: 09/27/2022 PCP: [name], MD      Brief Narrative:    Medical records reviewed and are as summarized below:  Avi A Rosander is a 52 y.o. male with medical history significant for left lower extremity lymphedema, chronic venous insufficiency, remote history of lower extremity cellulitis, PVD, ESRD on hemodialysis, blood clot in dialysis graft on Coumadin, sleep apnea not on CPAP, insulin-dependent diabetes mellitus, hypertension, hypercholesterolemia, chronic anemia, morbid obesity.  He has a left ankle ulcer and he saw Dr. Delana Meyer, vascular surgeon, in the office on 08/19/2022 for this.  Local wound care and tramadol were prescribed at that time.  He noticed increasing pain, redness and swelling in the left leg and foot about a week prior to admission. He went to see Dr. Delana Meyer in the office on 09/26/2022.  He presented to the emergency room for further management.  He also complained of cough, body aches and decreased appetite.  He tested positive for COVID-19 infection on Saturday, 09/25/2022.  He was admitted to the hospital for cellulitis of the left lower extremity.     Assessment/Plan:   Principal Problem:   Cellulitis of left lower extremity Active Problems:   Diabetes mellitus with hyperglycemia (HCC)   Chronic venous insufficiency   Venous ulcer of ankle, left (HCC)   CKD (chronic kidney disease) stage 5, GFR less than 15 ml/min (HCC)   Morbid obesity with BMI of 45.0-49.9, adult (HCC)   End-stage renal disease ([place])   Diabetic ulcer of lower leg ([place])   COVID-19 virus infection   Body mass index is 47.04 kg/m.  (Morbid obesity)    Cellulitis of left lower extremity, chronic venous insufficiency bilateral legs, left leg lymphedema, left ankle ulcer, leukocytosis: Continue IV vancomycin and IV ceftriaxone.  WBC slowly trending down.  Analgesics as needed for pain.  Continue local wound  care.   COVID-19 infection: Discontinue IV dexamethasone.  Completed 3 doses of IV remdesivir on 09/29/2022.    Acute hypoxic respiratory failure: Resolved.  He is tolerating room air.  Discontinue IV dexamethasone.   Insulin-dependent diabetes mellitus with hyperglycemia: Decrease insulin glargine from 30 units back to 20 units nightly since dexamethasone has been discontinued.  Discontinue scheduled NovoLog for now.  Will adjust insulin as needed based on blood glucose levels..   ESRD, hyperkalemia: Plan for hemodialysis tomorrow noted.  Follow-up with nephrologist.   PVD, history of DVT, history of clot in AV graft: INR is down to 3.3.  Warfarin has been restarted 2.5 mg.  Follow-up with pharmacist for warfarin dose adjustment.  Elevated liver enzymes: Improved   General weakness: PT and OT recommended home health therapy   Diet Order             Diet Carb Modified Fluid consistency: Thin; Room service appropriate? Yes  Diet effective now                            Consultants: Nephrologist  Procedures: None    Medications:    allopurinol  100 mg Oral Daily   bromocriptine  2.5 mg Oral QHS   calcium acetate  2,001 mg Oral TID WC   Chlorhexidine Gluconate Cloth  6 each Topical Q0600   cinacalcet  30 mg Oral Q breakfast   insulin aspart  0-15 Units Subcutaneous TID WC   insulin aspart  0-5 Units  Subcutaneous QHS   insulin glargine-yfgn  20 Units Subcutaneous QHS   multivitamin  1 tablet Oral Once per day on Mon Wed Fri   polyethylene glycol  17 g Oral Daily   sodium chloride flush  3 mL Intravenous Q12H   warfarin  2.5 mg Oral ONCE-1600   Warfarin - Pharmacist Dosing Inpatient   Does not apply q1600   Continuous Infusions:  cefTRIAXone (ROCEPHIN)  IV 1 g (09/30/22 1827)   vancomycin 1,000 mg (09/30/22 1629)     Anti-infectives (From admission, onward)    Start     Dose/Rate Route Frequency Ordered Stop   09/28/22 1630  cefTRIAXone  (ROCEPHIN) 1 g in sodium chloride 0.9 % 100 mL IVPB        1 g 200 mL/hr over 30 Minutes Intravenous Every 24 hours 09/28/22 1532 10/04/22 1629   09/28/22 1200  vancomycin (VANCOCIN) IVPB 1000 mg/200 mL premix        1,000 mg 200 mL/hr over 60 Minutes Intravenous Every T-Th-Sa (Hemodialysis) 09/28/22 0833 10/05/22 1159   09/28/22 1000  remdesivir 100 mg in sodium chloride 0.9 % 100 mL IVPB       See Hyperspace for full Linked Orders Report.   100 mg 200 mL/hr over 30 Minutes Intravenous Daily 09/27/22 2028 09/29/22 0925   09/27/22 2030  remdesivir 200 mg in sodium chloride 0.9% 250 mL IVPB       See Hyperspace for full Linked Orders Report.   200 mg 580 mL/hr over 30 Minutes Intravenous Once 09/27/22 2028 09/27/22 2335   09/27/22 2015  vancomycin (VANCOREADY) IVPB 500 mg/100 mL        500 mg 100 mL/hr over 60 Minutes Intravenous  Once 09/27/22 2008 09/28/22 0101   09/27/22 2007  vancomycin variable dose per unstable renal function (pharmacist dosing)  Status:  Discontinued         Does not apply See admin instructions 09/27/22 2008 09/28/22 0833   09/27/22 1730  vancomycin (VANCOREADY) IVPB 2000 mg/400 mL        2,000 mg 200 mL/hr over 120 Minutes Intravenous  Once 09/27/22 1707 09/27/22 2313   09/27/22 1715  cefTRIAXone (ROCEPHIN) 2 g in sodium chloride 0.9 % 100 mL IVPB        2 g 200 mL/hr over 30 Minutes Intravenous  Once 09/27/22 1707 09/27/22 1933              Family Communication/Anticipated D/C date and plan/Code Status   DVT prophylaxis:  warfarin (COUMADIN) tablet 2.5 mg     Code Status: Full Code  Family Communication: None Disposition Plan: Plan to discharge home in 1 to 2 days   Status is: Inpatient Remains inpatient appropriate because: On IV antibiotics       Subjective:   Pain in the left leg and foot is slowly improving.  No shortness of breath or chest pain.  Objective:    Vitals:   09/30/22 2053 10/01/22 0050 10/01/22 0521 10/01/22  0832  BP: (!) 141/56 120/61 127/80 136/74  Pulse: (!) 56 (!) 55 (!) 56 (!) 103  Resp: '18 20 18 20  '$ Temp: (!) 97.4 F (36.3 C) 98 F (36.7 C) 97.8 F (36.6 C) 97.7 F (36.5 C)  TempSrc:  Oral    SpO2: 97% 93% 97% 96%  Weight:      Height:       No data found.   Intake/Output Summary (Last 24 hours) at 10/01/2022 1523 Last data filed at 09/30/2022  1747 Gross per 24 hour  Intake --  Output 1500 ml  Net -1500 ml   Filed Weights   09/28/22 1413 09/30/22 1439 09/30/22 1753  Weight: (!) 178.3 kg (!) 177.4 kg (!) 175.3 kg    Exam:   GEN: NAD SKIN: Warm and dry EYES: EOMI ENT: MMM CV: RRR PULM: CTA B ABD: soft, obese, NT, +BS CNS: AAO x 3, non focal EXT: Left leg swelling and tenderness.  Wound on the lateral aspect of the back without any drainage             Data Reviewed:   I have personally reviewed following labs and imaging studies:  Labs: Labs show the following:   Basic Metabolic Panel: Recent Labs  Lab 09/27/22 1427 09/28/22 0448 09/29/22 0608 09/30/22 0654 09/30/22 2210 10/01/22 0351  NA 132* 134* 135 133* 133* 134*  K 4.6 4.5 5.4* 5.7* 4.9 5.4*  CL 89* 95* 93* 93* 93* 93*  CO2 24 24 21* '23 24 25  '$ GLUCOSE 213* 180* 229* 364* 242* 249*  BUN 73* 85* 79* 115* 93* 104*  CREATININE 13.69* 15.07* 13.19* 14.74* 11.94* 12.67*  CALCIUM 9.1 8.5* 9.1 9.0 8.8* 8.7*  PHOS 5.9*  --   --   --  6.5*  --    GFR Estimated Creatinine Clearance: 11.8 mL/min (A) (by C-G formula based on SCr of 12.67 mg/dL (H)). Liver Function Tests: Recent Labs  Lab 09/27/22 1427 09/29/22 0608 09/30/22 2210  AST 50* 26  --   ALT 45* 33  --   ALKPHOS 121 107  --   BILITOT 1.2 1.0  --   PROT 8.6* 7.5  --   ALBUMIN 3.0* 2.4* 2.5*   No results for input(s): "LIPASE", "AMYLASE" in the last 168 hours. No results for input(s): "AMMONIA" in the last 168 hours. Coagulation profile Recent Labs  Lab 09/27/22 1707 09/28/22 0448 09/29/22 0608 09/30/22 0654  10/01/22 0351  INR 2.7* 3.4* 4.1* 4.1* 3.3*    CBC: Recent Labs  Lab 09/28/22 0448 09/29/22 0608 09/30/22 0654 09/30/22 1726 10/01/22 0351  WBC 19.6* 25.0* 20.8* 24.8* 19.6*  NEUTROABS  --  23.6* 19.5*  --  18.2*  HGB 10.6* 11.8* 12.2* 11.6* 11.1*  HCT 33.0* 35.8* 36.1* 32.9* 32.6*  MCV 96.2 95.2 93.3 89.9 91.6  PLT 223 275 314 340 334   Cardiac Enzymes: No results for input(s): "CKTOTAL", "CKMB", "CKMBINDEX", "TROPONINI" in the last 168 hours. BNP (last 3 results) No results for input(s): "PROBNP" in the last 8760 hours. CBG: Recent Labs  Lab 09/30/22 1155 09/30/22 1741 09/30/22 2057 10/01/22 0832 10/01/22 1204  GLUCAP 328* 176* 239* 276* 286*   D-Dimer: No results for input(s): "DDIMER" in the last 72 hours. Hgb A1c: No results for input(s): "HGBA1C" in the last 72 hours.  Lipid Profile: No results for input(s): "CHOL", "HDL", "LDLCALC", "TRIG", "CHOLHDL", "LDLDIRECT" in the last 72 hours. Thyroid function studies: No results for input(s): "TSH", "T4TOTAL", "T3FREE", "THYROIDAB" in the last 72 hours.  Invalid input(s): "FREET3" Anemia work up: No results for input(s): "VITAMINB12", "FOLATE", "FERRITIN", "TIBC", "IRON", "RETICCTPCT" in the last 72 hours. Sepsis Labs: Recent Labs  Lab 09/27/22 1707 09/28/22 0448 09/29/22 0608 09/30/22 0654 09/30/22 1726 10/01/22 0351  WBC  --    < > 25.0* 20.8* 24.8* 19.6*  LATICACIDVEN 1.5  --   --   --   --   --    < > = values in this interval not displayed.  Microbiology Recent Results (from the past 240 hour(s))  Blood culture (routine x 2)     Status: None (Preliminary result)   Collection Time: 09/27/22  5:07 PM   Specimen: BLOOD  Result Value Ref Range Status   Specimen Description BLOOD RIGHT ANTECUBITAL  Final   Special Requests   Final    BOTTLES DRAWN AEROBIC AND ANAEROBIC Blood Culture results may not be optimal due to an inadequate volume of blood received in culture bottles   Culture   Final     NO GROWTH 4 DAYS Performed at [facility], [address] [place], [place] [address]    Report Status PENDING  Incomplete  Resp panel by RT-PCR (RSV, Flu A&B, Covid) Anterior Nasal Swab     Status: Abnormal   Collection Time: 09/27/22  5:07 PM   Specimen: Anterior Nasal Swab  Result Value Ref Range Status   SARS Coronavirus 2 by RT PCR POSITIVE (A) NEGATIVE Final    Comment: (NOTE) SARS-CoV-2 target nucleic acids are DETECTED.  The SARS-CoV-2 RNA is generally detectable in upper respiratory specimens during the acute phase of infection. Positive results are indicative of the presence of the identified virus, but do not rule out bacterial infection or co-infection with other pathogens not detected by the test. Clinical correlation with patient history and other diagnostic information is necessary to determine patient infection status. The expected result is Negative.  Fact Sheet for Patients: EntrepreneurPulse.com.au  Fact Sheet for Healthcare Providers: IncredibleEmployment.be  This test is not yet approved or cleared by the [place] FDA and  has been authorized for detection and/or diagnosis of SARS-CoV-2 by FDA under an Emergency Use Authorization (EUA).  This EUA will remain in effect (meaning this test can be used) for the duration of  the COVID-19 declaration under Section 564(b)(1) of the A ct, 21 U.S.C. section 360bbb-3(b)(1), unless the authorization is terminated or revoked sooner.     Influenza A by PCR NEGATIVE NEGATIVE Final   Influenza B by PCR NEGATIVE NEGATIVE Final    Comment: (NOTE) The Xpert Xpress SARS-CoV-2/FLU/RSV plus assay is intended as an aid in the diagnosis of influenza from Nasopharyngeal swab specimens and should not be used as a sole basis for treatment. Nasal washings and aspirates are unacceptable for Xpert Xpress SARS-CoV-2/FLU/RSV testing.  Fact Sheet for  Patients: EntrepreneurPulse.com.au  Fact Sheet for Healthcare Providers: IncredibleEmployment.be  This test is not yet approved or cleared by the [place] FDA and has been authorized for detection and/or diagnosis of SARS-CoV-2 by FDA under an Emergency Use Authorization (EUA). This EUA will remain in effect (meaning this test can be used) for the duration of the COVID-19 declaration under Section 564(b)(1) of the Act, 21 U.S.C. section 360bbb-3(b)(1), unless the authorization is terminated or revoked.     Resp Syncytial Virus by PCR NEGATIVE NEGATIVE Final    Comment: (NOTE) Fact Sheet for Patients: EntrepreneurPulse.com.au  Fact Sheet for Healthcare Providers: IncredibleEmployment.be  This test is not yet approved or cleared by the [place] FDA and has been authorized for detection and/or diagnosis of SARS-CoV-2 by FDA under an Emergency Use Authorization (EUA). This EUA will remain in effect (meaning this test can be used) for the duration of the COVID-19 declaration under Section 564(b)(1) of the Act, 21 U.S.C. section 360bbb-3(b)(1), unless the authorization is terminated or revoked.  Performed at [facility], [address] [place], [place] [address]   Blood culture (routine x 2)     Status:  None (Preliminary result)   Collection Time: 09/27/22  5:12 PM   Specimen: BLOOD  Result Value Ref Range Status   Specimen Description BLOOD LEFT ANTECUBITAL  Final   Special Requests   Final    BOTTLES DRAWN AEROBIC AND ANAEROBIC Blood Culture results may not be optimal due to an inadequate volume of blood received in culture bottles   Culture   Final    NO GROWTH 4 DAYS Performed at [facility], [address], [place], [place] [address]    Report Status PENDING  Incomplete    Procedures and diagnostic studies:  No results found.             LOS: 4 days    Kaileen Bronkema  [facility]   Pager on [email or web address]. If 7PM-7AM, please contact night-coverage at [email or web address]     10/01/2022, 3:23 PM

## 2022-10-01 NOTE — Consult Note (Signed)
ANTICOAGULATION CONSULT NOTE - Follow Up Consult  Pharmacy Consult for Warfarin Indication: DVT  Allergies  Allergen Reactions   Lisinopril Swelling    Lips mouth    Furosemide Cough    Light headed, blurry vision, weakness.   Latex    Adhesive [Tape] Itching and Rash    Patient Measurements: Height: '6\' 4"'$  (193 cm) Weight: (!) 175.3 kg (386 lb 7.5 oz) IBW/kg (Calculated) : 86.8   Vital Signs: Temp: 97.8 F (36.6 C) (02/02 0521) Temp Source: Oral (02/02 0050) BP: 127/80 (02/02 0521) Pulse Rate: 56 (02/02 0521)  Labs: Recent Labs    09/29/22 0608 09/30/22 0654 09/30/22 1726 09/30/22 2210 10/01/22 0351  HGB 11.8* 12.2* 11.6*  --  11.1*  HCT 35.8* 36.1* 32.9*  --  32.6*  PLT 275 314 340  --  334  LABPROT 39.4* 39.5*  --   --  33.6*  INR 4.1* 4.1*  --   --  3.3*  CREATININE 13.19* 14.74*  --  11.94* 12.67*     Estimated Creatinine Clearance: 11.8 mL/min (A) (by C-G formula based on SCr of 12.67 mg/dL (H)).   Medications:  Facility-Administered Medications Prior to Admission  Medication Dose Route Frequency Provider Last Rate Last Admin   [DISCONTINUED] 0.9 %  sodium chloride infusion   Intravenous PRN Causey, Charlestine Massed, NP       Medications Prior to Admission  Medication Sig Dispense Refill Last Dose   allopurinol (ZYLOPRIM) 100 MG tablet Take 100 mg by mouth daily.   09/26/2022   B Complex-C-Folic Acid (RENA-VITE RX) 1 MG TABS Take 1 tablet by mouth 3 (three) times a week.    Past Week   bromocriptine (PARLODEL) 2.5 MG tablet TAKE 1 TABLET BY MOUTH AT BEDTIME. 90 tablet 3 Past Month   calcium acetate (PHOSLO) 667 MG capsule Take 1,334-2,001 mg by mouth See admin instructions. Take 2001 mg by mouth with each meal & take 1334 mg by mouth with each snack.   09/26/2022   cinacalcet (SENSIPAR) 30 MG tablet Take 30 mg by mouth daily.   09/26/2022   CINNAMON PO Take 500 mg by mouth 2 (two) times daily.    09/26/2022   Insulin Glargine (LANTUS SOLOSTAR) 100 UNIT/ML  Solostar Pen Inject 15 Units into the skin at bedtime.    09/26/2022   Multiple Vitamin (MULTIVITAMIN WITH MINERALS) TABS tablet Take 1 tablet by mouth 3 (three) times a week. Men's Multivitamin   Past Week   Multiple Vitamins-Minerals (EYE HEALTH PO) Take 1 capsule by mouth daily.    09/26/2022   warfarin (COUMADIN) 5 MG tablet Take 10 mg by mouth daily.    09/26/2022   amLODipine (NORVASC) 10 MG tablet Take 5 mg by mouth daily as needed (if bp is 190/90 or higher).    prn at prn   atenolol (TENORMIN) 50 MG tablet Take 25 mg by mouth daily as needed (if bp is 190/90 or higher).    prn at prn   ibuprofen (ADVIL) 200 MG tablet Take 200 mg by mouth every 6 (six) hours as needed for headache or moderate pain.   prn at prn   lidocaine-prilocaine (EMLA) cream Apply 1 application topically as needed (port access).   prn at prn   loratadine (CLARITIN) 10 MG tablet Take 10 mg by mouth daily as needed for allergies.   prn at prn   melatonin 5 MG TABS Take 5 mg by mouth at bedtime as needed (sleep).    prn  at prn   Polyethyl Glycol-Propyl Glycol (SYSTANE OP) Place 1 drop into both eyes daily as needed (dry eyes).      sildenafil (VIAGRA) 100 MG tablet Take 1 tablet (100 mg total) by mouth daily as needed for erectile dysfunction. 30 tablet 3 prn at prn   traMADol (ULTRAM) 50 MG tablet Take 1 tablet (50 mg total) by mouth at bedtime as needed for moderate pain or severe pain. 25 tablet 0 prn at prn    Assessment: [name] is a 52 YO M with a PMH of ESRD on dialysis, DM, HLN, and lymphedema of the left LE with prior remote leg cellulitis. Was diagnosed to have progressive cellulitis, DVT, necrotizing infection, osteomyelitis and positive for COVID. [name] was on warfarin previously for blood clots during dialysis treatment. Pharmacy has been consulted for warfarin therapy.   1/29: '10mg'$  PO x 1  1/30: HOLD 1/31: INR: 3.4>4.1.HOLD  2/01: INR: 4.1>>4.1 HOLD  02/02: INR: 4.1>3.3  Goal of Therapy:  INR 2-3 Monitor  platelets by anticoagulation protocol: Yes   Plan:   Give 2.5 mg warfarin. Check INR with am labs   [name], Pharmacy Student  10/01/2022,8:22 AM

## 2022-10-01 NOTE — Care Management Important Message (Signed)
Important Message  Patient Details  Name: CHRISTOS MIXSON MRN: [id number] Date of Birth: 03-18-1971   Medicare Important Message Given:  Other (see comment)  Attempted to reach patient via room phone [phone]) to review Medicare IM.  Unable to reach upon attempt.   Dannette Barbara 10/01/2022, 2:06 PM

## 2022-10-01 NOTE — Consult Note (Addendum)
Pharmacy Antibiotic Note  [name] is a 52 y.o. male admitted on 09/27/2022 with cellulitis. PMH significant for T1DM, ESRD on HD TTS, HTN, PVD, history of DVT on warfarin, lymphedema. Pharmacy has been consulted for vancomycin dosing.  Plan: Day 5 of antibiotics Give vancomycin 1000 mg IV QHD (TTS). Goal trough 15-25 mcg/mL Patient is also on ceftriaxone 1g IV Q24H Continue to monitor renal function and follow culture results   Height: '6\' 4"'$  (193 cm) Weight: (!) 175.3 kg (386 lb 7.5 oz) IBW/kg (Calculated) : 86.8  Temp (24hrs), Avg:97.9 F (36.6 C), Min:97.4 F (36.3 C), Max:98.9 F (37.2 C)  Recent Labs  Lab 09/27/22 1707 09/28/22 0448 09/29/22 0608 09/30/22 0654 09/30/22 1726 09/30/22 2210 10/01/22 0351  WBC  --  19.6* 25.0* 20.8* 24.8*  --  19.6*  CREATININE  --  15.07* 13.19* 14.74*  --  11.94* 12.67*  LATICACIDVEN 1.5  --   --   --   --   --   --   VANCORANDOM  --  23  --   --   --   --   --      Estimated Creatinine Clearance: 11.8 mL/min (A) (by C-G formula based on SCr of 12.67 mg/dL (H)).    Allergies  Allergen Reactions   Lisinopril Swelling    Lips mouth    Furosemide Cough    Light headed, blurry vision, weakness.   Latex    Adhesive [Tape] Itching and Rash   Antimicrobials this admission: 1/29 Remdesivir >> 1/31 1/29 Vancomycin >>  1/30 Ceftriaxone >>  Microbiology results: 1/29 BCx: NG4D 1/29 COVID PCR: positive   Thank you for allowing pharmacy to be a part of this patient's care.  Gretel Acre, PharmD PGY1 Pharmacy Resident 10/01/2022 8:24 AM

## 2022-10-02 ENCOUNTER — Encounter (INDEPENDENT_AMBULATORY_CARE_PROVIDER_SITE_OTHER): Payer: Self-pay | Admitting: Vascular Surgery

## 2022-10-02 DIAGNOSIS — N186 End stage renal disease: Secondary | ICD-10-CM | POA: Diagnosis not present

## 2022-10-02 DIAGNOSIS — U071 COVID-19: Secondary | ICD-10-CM | POA: Diagnosis not present

## 2022-10-02 DIAGNOSIS — L03116 Cellulitis of left lower limb: Secondary | ICD-10-CM | POA: Diagnosis not present

## 2022-10-02 LAB — GLUCOSE, CAPILLARY
Glucose-Capillary: 247 mg/dL — ABNORMAL HIGH (ref 70–99)
Glucose-Capillary: 272 mg/dL — ABNORMAL HIGH (ref 70–99)
Glucose-Capillary: 255 mg/dL — ABNORMAL HIGH (ref 70–99)
Glucose-Capillary: 227 mg/dL — ABNORMAL HIGH (ref 70–99)

## 2022-10-02 LAB — CBC
HCT: 37.5 % — ABNORMAL LOW (ref 39.0–52.0)
Hemoglobin: 12.4 g/dL — ABNORMAL LOW (ref 13.0–17.0)
MCH: 30.9 pg (ref 26.0–34.0)
MCHC: 33.1 g/dL (ref 30.0–36.0)
MCV: 93.5 fL (ref 80.0–100.0)
Platelets: 369 10*3/uL (ref 150–400)
RBC: 4.01 MIL/uL — ABNORMAL LOW (ref 4.22–5.81)
RDW: 13.6 % (ref 11.5–15.5)
WBC: 20.2 10*3/uL — ABNORMAL HIGH (ref 4.0–10.5)
nRBC: 0 % (ref 0.0–0.2)

## 2022-10-02 LAB — CULTURE, BLOOD (ROUTINE X 2)
Culture: NO GROWTH
Culture: NO GROWTH

## 2022-10-02 LAB — RENAL FUNCTION PANEL
Albumin: 2.4 g/dL — ABNORMAL LOW (ref 3.5–5.0)
Anion gap: 15 (ref 5–15)
BUN: 125 mg/dL — ABNORMAL HIGH (ref 6–20)
CO2: 24 mmol/L (ref 22–32)
Calcium: 8.5 mg/dL — ABNORMAL LOW (ref 8.9–10.3)
Chloride: 93 mmol/L — ABNORMAL LOW (ref 98–111)
Creatinine, Ser: 14.29 mg/dL — ABNORMAL HIGH (ref 0.61–1.24)
GFR, Estimated: 4 mL/min — ABNORMAL LOW (ref >60)
Glucose, Bld: 254 mg/dL — ABNORMAL HIGH (ref 70–99)
Phosphorus: 8.4 mg/dL — ABNORMAL HIGH (ref 2.5–4.6)
Potassium: 5.8 mmol/L — ABNORMAL HIGH (ref 3.5–5.1)
Sodium: 132 mmol/L — ABNORMAL LOW (ref 135–145)

## 2022-10-02 LAB — PROTIME-INR
INR: 3.1 — ABNORMAL HIGH (ref 0.8–1.2)
Prothrombin Time: 31.8 seconds — ABNORMAL HIGH (ref 11.4–15.2)

## 2022-10-02 MED ORDER — WARFARIN SODIUM 2.5 MG PO TABS
2.5000 mg | ORAL_TABLET | Freq: Once | ORAL | Status: AC
  Administered 2022-10-02: 2.5 mg via ORAL
  Filled 2022-10-02: qty 1

## 2022-10-02 NOTE — Progress Notes (Signed)
PT did 3 hrs of HD, tolerated well with no signs of distress.  UF = 1500 ml    10/02/22 1929  Vitals  Temp 97.8 F (36.6 C)  Temp Source Oral  BP 101/87  MAP (mmHg) 93  BP Location Right Wrist  BP Method Automatic  Patient Position (if appropriate) Lying  Pulse Rate 78  Pulse Rate Source Monitor  ECG Heart Rate 78  Resp 18  Oxygen Therapy  SpO2 98 %  O2 Device Room Air  Patient Activity (if Appropriate) In bed  Pulse Oximetry Type Continuous  Post Treatment  Dialyzer Clearance Lightly streaked  Duration of HD Treatment -hour(s) 3 hour(s)  Hemodialysis Intake (mL) 0 mL  Liters Processed 72  Fluid Removed (mL) 1500 mL  Tolerated HD Treatment Yes  Post-Hemodialysis Comments Tolerated well with no issues  AVG/AVF Arterial Site Held (minutes) 10 minutes  AVG/AVF Venous Site Held (minutes) 10 minutes

## 2022-10-02 NOTE — Progress Notes (Signed)
[facility], [place] 10/02/22  Subjective:   LOS: 5  Patient known to our practice from outpatient dialysis.  He was recently diagnosed with COVID-19.  States that he came in because of left leg worsening redness and pain.  He was diagnosed with cellulitis and admitted for IV antibiotics.  Patient seen sitting up in bed, currently eating breakfast Remains on room air Alert and oriented Trace lower extremity edema  Objective:  Vital signs in last 24 hours:  Temp:  [97.4 F (36.3 C)-98.8 F (37.1 C)] 97.7 F (36.5 C) (02/03 0748) Pulse Rate:  [40-103] 103 (02/03 0748) Resp:  [18-20] 18 (02/03 0748) BP: (116-122)/(76-81) 118/76 (02/03 0748) SpO2:  [94 %-98 %] 94 % (02/03 0748)  Weight change:  Filed Weights   09/28/22 1413 09/30/22 1439 09/30/22 1753  Weight: (!) 178.3 kg (!) 177.4 kg (!) 175.3 kg    Intake/Output:    Intake/Output Summary (Last 24 hours) at 10/02/2022 1244 Last data filed at 10/02/2022 0610 Gross per 24 hour  Intake 960 ml  Output --  Net 960 ml     Physical Exam: General:  No acute distress, laying in the bed  HEENT  anicteric, moist oral mucous membrane  Pulm/lungs  normal breathing effort, lungs are clear to auscultation  CVS/Heart  regular rhythm, no rub or gallop  Abdomen:   Soft, nontender  Extremities: Trace -1+ peripheral edema, left leg bandaged.  Neurologic:  Alert, oriented, able to follow commands  Skin:  No acute rashes  Dialysis access: Left arm AV graft.    Basic Metabolic Panel:  Recent Labs  Lab 09/27/22 1427 09/28/22 0448 09/29/22 0608 09/30/22 0654 09/30/22 2210 10/01/22 0351  NA 132* 134* 135 133* 133* 134*  K 4.6 4.5 5.4* 5.7* 4.9 5.4*  CL 89* 95* 93* 93* 93* 93*  CO2 24 24 21* '23 24 25  '$ GLUCOSE 213* 180* 229* 364* 242* 249*  BUN 73* 85* 79* 115* 93* 104*  CREATININE 13.69* 15.07* 13.19* 14.74* 11.94* 12.67*  CALCIUM 9.1 8.5* 9.1 9.0 8.8* 8.7*  PHOS 5.9*  --   --   --  6.5*  --        CBC: Recent Labs  Lab 09/28/22 0448 09/29/22 0608 09/30/22 0654 09/30/22 1726 10/01/22 0351  WBC 19.6* 25.0* 20.8* 24.8* 19.6*  NEUTROABS  --  23.6* 19.5*  --  18.2*  HGB 10.6* 11.8* 12.2* 11.6* 11.1*  HCT 33.0* 35.8* 36.1* 32.9* 32.6*  MCV 96.2 95.2 93.3 89.9 91.6  PLT 223 275 314 340 334       Lab Results  Component Value Date   HEPBSAG NON REACTIVE 09/28/2022      Microbiology:  Recent Results (from the past 240 hour(s))  Blood culture (routine x 2)     Status: None   Collection Time: 09/27/22  5:07 PM   Specimen: BLOOD  Result Value Ref Range Status   Specimen Description BLOOD RIGHT ANTECUBITAL  Final   Special Requests   Final    BOTTLES DRAWN AEROBIC AND ANAEROBIC Blood Culture results may not be optimal due to an inadequate volume of blood received in culture bottles   Culture   Final    NO GROWTH 5 DAYS Performed at [facility], [address] [place], [place] [address]    Report Status 10/02/2022 FINAL  Final  Resp panel by RT-PCR (RSV, Flu A&B, Covid) Anterior Nasal Swab     Status: Abnormal   Collection Time: 09/27/22  5:07 PM   Specimen: Anterior Nasal Swab  Result Value Ref Range Status   SARS Coronavirus 2 by RT PCR POSITIVE (A) NEGATIVE Final    Comment: (NOTE) SARS-CoV-2 target nucleic acids are DETECTED.  The SARS-CoV-2 RNA is generally detectable in upper respiratory specimens during the acute phase of infection. Positive results are indicative of the presence of the identified virus, but do not rule out bacterial infection or co-infection with other pathogens not detected by the test. Clinical correlation with patient history and other diagnostic information is necessary to determine patient infection status. The expected result is Negative.  Fact Sheet for Patients: EntrepreneurPulse.com.au  Fact Sheet for Healthcare Providers: IncredibleEmployment.be  This test is not yet  approved or cleared by the [place] FDA and  has been authorized for detection and/or diagnosis of SARS-CoV-2 by FDA under an Emergency Use Authorization (EUA).  This EUA will remain in effect (meaning this test can be used) for the duration of  the COVID-19 declaration under Section 564(b)(1) of the A ct, 21 U.S.C. section 360bbb-3(b)(1), unless the authorization is terminated or revoked sooner.     Influenza A by PCR NEGATIVE NEGATIVE Final   Influenza B by PCR NEGATIVE NEGATIVE Final    Comment: (NOTE) The Xpert Xpress SARS-CoV-2/FLU/RSV plus assay is intended as an aid in the diagnosis of influenza from Nasopharyngeal swab specimens and should not be used as a sole basis for treatment. Nasal washings and aspirates are unacceptable for Xpert Xpress SARS-CoV-2/FLU/RSV testing.  Fact Sheet for Patients: EntrepreneurPulse.com.au  Fact Sheet for Healthcare Providers: IncredibleEmployment.be  This test is not yet approved or cleared by the [place] FDA and has been authorized for detection and/or diagnosis of SARS-CoV-2 by FDA under an Emergency Use Authorization (EUA). This EUA will remain in effect (meaning this test can be used) for the duration of the COVID-19 declaration under Section 564(b)(1) of the Act, 21 U.S.C. section 360bbb-3(b)(1), unless the authorization is terminated or revoked.     Resp Syncytial Virus by PCR NEGATIVE NEGATIVE Final    Comment: (NOTE) Fact Sheet for Patients: EntrepreneurPulse.com.au  Fact Sheet for Healthcare Providers: IncredibleEmployment.be  This test is not yet approved or cleared by the [place] FDA and has been authorized for detection and/or diagnosis of SARS-CoV-2 by FDA under an Emergency Use Authorization (EUA). This EUA will remain in effect (meaning this test can be used) for the duration of the COVID-19 declaration under Section 564(b)(1) of  the Act, 21 U.S.C. section 360bbb-3(b)(1), unless the authorization is terminated or revoked.  Performed at [facility], [address] [place], [place] [address]   Blood culture (routine x 2)     Status: None   Collection Time: 09/27/22  5:12 PM   Specimen: BLOOD  Result Value Ref Range Status   Specimen Description BLOOD LEFT ANTECUBITAL  Final   Special Requests   Final    BOTTLES DRAWN AEROBIC AND ANAEROBIC Blood Culture results may not be optimal due to an inadequate volume of blood received in culture bottles   Culture   Final    NO GROWTH 5 DAYS Performed at [facility], [address], [place], [place] [address]    Report Status 10/02/2022 FINAL  Final    Coagulation Studies: Recent Labs    09/30/22 0654 10/01/22 0351 10/02/22 0419  LABPROT 39.5* 33.6* 31.8*  INR 4.1* 3.3* 3.1*     Urinalysis: No results for input(s): "COLORURINE", "LABSPEC", "PHURINE", "GLUCOSEU", "HGBUR", "BILIRUBINUR", "KETONESUR", "PROTEINUR", "UROBILINOGEN", "  NITRITE", "LEUKOCYTESUR" in the last 72 hours.  Invalid input(s): "APPERANCEUR"    Imaging: No results found.   Medications:    cefTRIAXone (ROCEPHIN)  IV Stopped (10/02/22 0154)   vancomycin 200 mL/hr at 10/02/22 9892    allopurinol  100 mg Oral Daily   bromocriptine  2.5 mg Oral QHS   calcium acetate  2,001 mg Oral TID WC   Chlorhexidine Gluconate Cloth  6 each Topical Q0600   cinacalcet  30 mg Oral Q breakfast   insulin aspart  0-15 Units Subcutaneous TID WC   insulin aspart  0-5 Units Subcutaneous QHS   insulin glargine-yfgn  20 Units Subcutaneous QHS   multivitamin  1 tablet Oral Once per day on Mon Wed Fri   polyethylene glycol  17 g Oral Daily   sodium chloride flush  3 mL Intravenous Q12H   warfarin  2.5 mg Oral ONCE-1600   Warfarin - Pharmacist Dosing Inpatient   Does not apply q1600   acetaminophen **OR** acetaminophen, amLODipine, atenolol, bisacodyl, lidocaine-prilocaine, melatonin,  oxyCODONE, polyvinyl alcohol, senna-docusate  Assessment/ Plan:  52 y.o. male with end-stage renal disease on hemodialysis, obstructive sleep apnea, insulin-dependent diabetes, hypertension, hyperlipidemia, morbid obesity, left lower extremity lymphedema, chronic venous insufficiency was admitted on 09/27/2022 for  Principal Problem:   Cellulitis of left lower extremity Active Problems:   Diabetes mellitus with hyperglycemia (HCC)   Chronic venous insufficiency   Venous ulcer of ankle, left (HCC)   CKD (chronic kidney disease) stage 5, GFR less than 15 ml/min (HCC)   Morbid obesity with BMI of 45.0-49.9, adult (HCC)   End-stage renal disease (HCC)   Diabetic ulcer of lower leg ([place])   COVID-19 virus infection  Cellulitis [L03.90] Cellulitis of left lower extremity [L03.116] Pneumonia due to COVID-19 virus [U07.1, J12.82]  #. ESRD [facility] dialysis/TTS / EDW 180 kg Scheduled to receive dialysis later today.  Treatment with completed at the end of the day due to positive COVID status.  UF goal 1.5 to 2 L as tolerated.  Next treatment scheduled for Tuesday.  Renal navigator has confirmed no modifications needed to outpatient dialysis schedule due to COVID status.   #. Anemia of CKD  Lab Results  Component Value Date   HGB 11.1 (L) 10/01/2022  Hemoglobin within desired range. No need for ESA's at this time.  #. Secondary hyperparathyroidism of renal origin N 25.81   No results found for: "PTH" Lab Results  Component Value Date   PHOS 6.5 (H) 09/30/2022   Will continue to monitor bone minerals during this admission.   #. Diabetes type 2 with CKD Hgb A1c MFr Bld (%)  Date Value  09/27/2022 8.8 (H)   Glucose elevated likely due to IV steroids. Primary team to manage with sliding scale insulin  #Left leg cellulitis, with underlying lymphedema. Continue vancomycin with dialysis and Rocephin, per primary team  Covid -19 Supportive care, patient  reports symptoms improving.      LOS: [place] 2/3/202412:44 PM  [place], [place]

## 2022-10-02 NOTE — Consult Note (Signed)
ANTICOAGULATION CONSULT NOTE - Follow Up Consult  Pharmacy Consult for Warfarin Indication: DVT  Allergies  Allergen Reactions   Lisinopril Swelling    Lips mouth    Furosemide Cough    Light headed, blurry vision, weakness.   Latex    Adhesive [Tape] Itching and Rash    Patient Measurements: Height: '6\' 4"'$  (193 cm) Weight: (!) 175.3 kg (386 lb 7.5 oz) IBW/kg (Calculated) : 86.8   Vital Signs: Temp: 97.7 F (36.5 C) (02/03 0748) Temp Source: Oral (02/03 0748) BP: 118/76 (02/03 0748) Pulse Rate: 103 (02/03 0748)  Labs: Recent Labs    09/30/22 0654 09/30/22 1726 09/30/22 2210 10/01/22 0351 10/02/22 0419  HGB 12.2* 11.6*  --  11.1*  --   HCT 36.1* 32.9*  --  32.6*  --   PLT 314 340  --  334  --   LABPROT 39.5*  --   --  33.6* 31.8*  INR 4.1*  --   --  3.3* 3.1*  CREATININE 14.74*  --  11.94* 12.67*  --      Estimated Creatinine Clearance: 11.8 mL/min (A) (by C-G formula based on SCr of 12.67 mg/dL (H)).   Medications:  Facility-Administered Medications Prior to Admission  Medication Dose Route Frequency Provider Last Rate Last Admin   [DISCONTINUED] 0.9 %  sodium chloride infusion   Intravenous PRN Causey, Charlestine Massed, NP       Medications Prior to Admission  Medication Sig Dispense Refill Last Dose   allopurinol (ZYLOPRIM) 100 MG tablet Take 100 mg by mouth daily.   09/26/2022   B Complex-C-Folic Acid (RENA-VITE RX) 1 MG TABS Take 1 tablet by mouth 3 (three) times a week.    Past Week   bromocriptine (PARLODEL) 2.5 MG tablet TAKE 1 TABLET BY MOUTH AT BEDTIME. 90 tablet 3 Past Month   calcium acetate (PHOSLO) 667 MG capsule Take 1,334-2,001 mg by mouth See admin instructions. Take 2001 mg by mouth with each meal & take 1334 mg by mouth with each snack.   09/26/2022   cinacalcet (SENSIPAR) 30 MG tablet Take 30 mg by mouth daily.   09/26/2022   CINNAMON PO Take 500 mg by mouth 2 (two) times daily.    09/26/2022   Insulin Glargine (LANTUS SOLOSTAR) 100 UNIT/ML  Solostar Pen Inject 15 Units into the skin at bedtime.    09/26/2022   Multiple Vitamin (MULTIVITAMIN WITH MINERALS) TABS tablet Take 1 tablet by mouth 3 (three) times a week. Men's Multivitamin   Past Week   Multiple Vitamins-Minerals (EYE HEALTH PO) Take 1 capsule by mouth daily.    09/26/2022   warfarin (COUMADIN) 5 MG tablet Take 10 mg by mouth daily.    09/26/2022   amLODipine (NORVASC) 10 MG tablet Take 5 mg by mouth daily as needed (if bp is 190/90 or higher).    prn at prn   atenolol (TENORMIN) 50 MG tablet Take 25 mg by mouth daily as needed (if bp is 190/90 or higher).    prn at prn   ibuprofen (ADVIL) 200 MG tablet Take 200 mg by mouth every 6 (six) hours as needed for headache or moderate pain.   prn at prn   lidocaine-prilocaine (EMLA) cream Apply 1 application topically as needed (port access).   prn at prn   loratadine (CLARITIN) 10 MG tablet Take 10 mg by mouth daily as needed for allergies.   prn at prn   melatonin 5 MG TABS Take 5 mg by mouth at  bedtime as needed (sleep).    prn at prn   Polyethyl Glycol-Propyl Glycol (SYSTANE OP) Place 1 drop into both eyes daily as needed (dry eyes).      sildenafil (VIAGRA) 100 MG tablet Take 1 tablet (100 mg total) by mouth daily as needed for erectile dysfunction. 30 tablet 3 prn at prn   traMADol (ULTRAM) 50 MG tablet Take 1 tablet (50 mg total) by mouth at bedtime as needed for moderate pain or severe pain. 25 tablet 0 prn at prn    Assessment: [name] is a 52 YO M with a PMH of ESRD on dialysis, DM, HLN, and lymphedema of the left LE with prior remote leg cellulitis. Was diagnosed to have progressive cellulitis, DVT, necrotizing infection, osteomyelitis and positive for COVID. [name] was on warfarin previously for blood clots during dialysis treatment. Pharmacy has been consulted for warfarin therapy.   1/29: '10mg'$  PO x 1  1/30: HOLD 1/31: INR: 3.4>4.1.HOLD  2/01: INR: 4.1>>4.1 HOLD  2/02: INR: 4.1>3.3, 2.'5mg'$   2/03: INR 3.3>3.1  Goal of Therapy:   INR 2-3 Monitor platelets by anticoagulation protocol: Yes   Plan:  Will give 2.5 mg warfarin x1 again today. Check INR with am labs   Pearla Dubonnet, PharmD Clinical Pharmacist 10/02/2022 9:04 AM

## 2022-10-02 NOTE — Progress Notes (Signed)
Progress Note    [name]  QMG:[id number] DOB: Apr 11, 1971  DOA: 09/27/2022 PCP: [name] Hartigan, MD      Brief Narrative:    Medical records reviewed and are as summarized below:  [name] is a 52 y.o. male with medical history significant for left lower extremity lymphedema, chronic venous insufficiency, remote history of lower extremity cellulitis, PVD, ESRD on hemodialysis, blood clot in dialysis graft on Coumadin, sleep apnea not on CPAP, insulin-dependent diabetes mellitus, hypertension, hypercholesterolemia, chronic anemia, morbid obesity.  He has a left ankle ulcer and he saw Dr. Delana [name], vascular surgeon, in the office on 08/19/2022 for this.  Local wound care and tramadol were prescribed at that time.  He noticed increasing pain, redness and swelling in the left leg and foot about a week prior to admission. He went to see Dr. Delana [name] in the office on 09/26/2022.  He presented to the emergency room for further management.  He also complained of cough, body aches and decreased appetite.  He tested positive for COVID-19 infection on Saturday, 09/25/2022.  He was admitted to the hospital for cellulitis of the left lower extremity.     Assessment/Plan:   Principal Problem:   Cellulitis of left lower extremity Active Problems:   Diabetes mellitus with hyperglycemia (HCC)   Chronic venous insufficiency   Venous ulcer of ankle, left (HCC)   CKD (chronic kidney disease) stage 5, GFR less than 15 ml/min (HCC)   Morbid obesity with BMI of 45.0-49.9, adult (HCC)   End-stage renal disease ([place])   Diabetic ulcer of lower leg ([place])   COVID-19 virus infection   Body mass index is 47.04 kg/m.  (Morbid obesity)    Cellulitis of left lower extremity, chronic venous insufficiency bilateral legs, left leg lymphedema, left ankle ulcer, leukocytosis: Continue IV ceftriaxone and IV vancomycin.  Plan to complete IV antibiotics tomorrow and discharge him on oral cefadroxil.   Continue analgesics as needed for pain.  Continue local wound care.   COVID-19 infection: IV dexamethasone was discontinued on 10/01/2018 after 4 doses..  Completed 3 doses of IV remdesivir on 09/29/2022.    Acute hypoxic respiratory failure: Resolved.  He is tolerating room air.     Insulin-dependent diabetes mellitus with hyperglycemia: Increase insulin from 20 to 25 units nightly.  Continue NovoLog as needed.   ESRD, hyperkalemia: Plan for hemodialysis today.  PVD, history of DVT, history of clot in AV graft: INR is 3.1.  Continue warfarin.  Follow-up with pharmacist for warfarin dose adjustment.  Elevated liver enzymes: Improved   General weakness: PT and OT recommended home health therapy   Diet Order             Diet Carb Modified Fluid consistency: Thin; Room service appropriate? Yes  Diet effective now                            Consultants: Nephrologist  Procedures: None    Medications:    allopurinol  100 mg Oral Daily   bromocriptine  2.5 mg Oral QHS   calcium acetate  2,001 mg Oral TID WC   Chlorhexidine Gluconate Cloth  6 each Topical Q0600   cinacalcet  30 mg Oral Q breakfast   insulin aspart  0-15 Units Subcutaneous TID WC   insulin aspart  0-5 Units Subcutaneous QHS   insulin glargine-yfgn  20 Units Subcutaneous QHS   multivitamin  1 tablet Oral Once  per day on Mon Wed Fri   polyethylene glycol  17 g Oral Daily   sodium chloride flush  3 mL Intravenous Q12H   warfarin  2.5 mg Oral ONCE-1600   Warfarin - Pharmacist Dosing Inpatient   Does not apply q1600   Continuous Infusions:  cefTRIAXone (ROCEPHIN)  IV Stopped (10/02/22 0154)   vancomycin 200 mL/hr at 10/02/22 0610     Anti-infectives (From admission, onward)    Start     Dose/Rate Route Frequency Ordered Stop   09/28/22 1630  cefTRIAXone (ROCEPHIN) 1 g in sodium chloride 0.9 % 100 mL IVPB        1 g 200 mL/hr over 30 Minutes Intravenous Every 24 hours 09/28/22 1532 10/04/22  1629   09/28/22 1200  vancomycin (VANCOCIN) IVPB 1000 mg/200 mL premix        1,000 mg 200 mL/hr over 60 Minutes Intravenous Every T-Th-Sa (Hemodialysis) 09/28/22 0833 10/05/22 1159   09/28/22 1000  remdesivir 100 mg in sodium chloride 0.9 % 100 mL IVPB       See Hyperspace for full Linked Orders Report.   100 mg 200 mL/hr over 30 Minutes Intravenous Daily 09/27/22 2028 09/29/22 0925   09/27/22 2030  remdesivir 200 mg in sodium chloride 0.9% 250 mL IVPB       See Hyperspace for full Linked Orders Report.   200 mg 580 mL/hr over 30 Minutes Intravenous Once 09/27/22 2028 09/27/22 2335   09/27/22 2015  vancomycin (VANCOREADY) IVPB 500 mg/100 mL        500 mg 100 mL/hr over 60 Minutes Intravenous  Once 09/27/22 2008 09/28/22 0101   09/27/22 2007  vancomycin variable dose per unstable renal function (pharmacist dosing)  Status:  Discontinued         Does not apply See admin instructions 09/27/22 2008 09/28/22 0833   09/27/22 1730  vancomycin (VANCOREADY) IVPB 2000 mg/400 mL        2,000 mg 200 mL/hr over 120 Minutes Intravenous  Once 09/27/22 1707 09/27/22 2313   09/27/22 1715  cefTRIAXone (ROCEPHIN) 2 g in sodium chloride 0.9 % 100 mL IVPB        2 g 200 mL/hr over 30 Minutes Intravenous  Once 09/27/22 1707 09/27/22 1933              Family Communication/Anticipated D/C date and plan/Code Status   DVT prophylaxis:  warfarin (COUMADIN) tablet 2.5 mg     Code Status: Full Code  Family Communication: None Disposition Plan: Plan to discharge home in 1 to 2 days   Status is: Inpatient Remains inpatient appropriate because: On IV antibiotics       Subjective:   Interval events noted.  He noticed some drainage on the dressing on the left leg and this was from a ruptured blister.  He had more pain in the left leg earlier this morning.  No shortness of breath or chest pain.  Cough is better. Jun, RN was at the bedside  Objective:    Vitals:   10/01/22 1600 10/01/22  2059 10/02/22 0507 10/02/22 0748  BP: 116/76 118/76 122/81 118/76  Pulse: 76 84 (!) 40 (!) 103  Resp: '20 20 18 18  '$ Temp: 97.9 F (36.6 C) 98.8 F (37.1 C) (!) 97.4 F (36.3 C) 97.7 F (36.5 C)  TempSrc:  Oral  Oral  SpO2: 96% 98% 95% 94%  Weight:      Height:       No data found.   Intake/Output  Summary (Last 24 hours) at 10/02/2022 1328 Last data filed at 10/02/2022 0610 Gross per 24 hour  Intake 960 ml  Output --  Net 960 ml   Filed Weights   09/28/22 1413 09/30/22 1439 09/30/22 1753  Weight: (!) 178.3 kg (!) 177.4 kg (!) 175.3 kg    Exam:   GEN: NAD SKIN: Warm and dry EYES: No pallor or icterus ENT: MMM CV: RRR PULM: CTA B ABD: soft, obese, NT, +BS CNS: AAO x 3, non focal EXT: Left leg and foot swelling with erythema and tenderness.  He has multiple hyperpigmented blisters on the left leg and foot.  Some of the blisters had ruptured.  Wound on the left lateral leg without any drainage            Data Reviewed:   I have personally reviewed following labs and imaging studies:  Labs: Labs show the following:   Basic Metabolic Panel: Recent Labs  Lab 09/27/22 1427 09/28/22 0448 09/29/22 0608 09/30/22 0654 09/30/22 2210 10/01/22 0351  NA 132* 134* 135 133* 133* 134*  K 4.6 4.5 5.4* 5.7* 4.9 5.4*  CL 89* 95* 93* 93* 93* 93*  CO2 24 24 21* '23 24 25  '$ GLUCOSE 213* 180* 229* 364* 242* 249*  BUN 73* 85* 79* 115* 93* 104*  CREATININE 13.69* 15.07* 13.19* 14.74* 11.94* 12.67*  CALCIUM 9.1 8.5* 9.1 9.0 8.8* 8.7*  PHOS 5.9*  --   --   --  6.5*  --    GFR Estimated Creatinine Clearance: 11.8 mL/min (A) (by C-G formula based on SCr of 12.67 mg/dL (H)). Liver Function Tests: Recent Labs  Lab 09/27/22 1427 09/29/22 0608 09/30/22 2210  AST 50* 26  --   ALT 45* 33  --   ALKPHOS 121 107  --   BILITOT 1.2 1.0  --   PROT 8.6* 7.5  --   ALBUMIN 3.0* 2.4* 2.5*   No results for input(s): "LIPASE", "AMYLASE" in the last 168 hours. No results for  input(s): "AMMONIA" in the last 168 hours. Coagulation profile Recent Labs  Lab 09/28/22 0448 09/29/22 5643 09/30/22 0654 10/01/22 0351 10/02/22 0419  INR 3.4* 4.1* 4.1* 3.3* 3.1*    CBC: Recent Labs  Lab 09/28/22 0448 09/29/22 0608 09/30/22 0654 09/30/22 1726 10/01/22 0351  WBC 19.6* 25.0* 20.8* 24.8* 19.6*  NEUTROABS  --  23.6* 19.5*  --  18.2*  HGB 10.6* 11.8* 12.2* 11.6* 11.1*  HCT 33.0* 35.8* 36.1* 32.9* 32.6*  MCV 96.2 95.2 93.3 89.9 91.6  PLT 223 275 314 340 334   Cardiac Enzymes: No results for input(s): "CKTOTAL", "CKMB", "CKMBINDEX", "TROPONINI" in the last 168 hours. BNP (last 3 results) No results for input(s): "PROBNP" in the last 8760 hours. CBG: Recent Labs  Lab 10/01/22 1204 10/01/22 1709 10/01/22 2216 10/02/22 0830 10/02/22 1114  GLUCAP 286* 221* 233* 247* 272*   D-Dimer: No results for input(s): "DDIMER" in the last 72 hours. Hgb A1c: No results for input(s): "HGBA1C" in the last 72 hours.  Lipid Profile: No results for input(s): "CHOL", "HDL", "LDLCALC", "TRIG", "CHOLHDL", "LDLDIRECT" in the last 72 hours. Thyroid function studies: No results for input(s): "TSH", "T4TOTAL", "T3FREE", "THYROIDAB" in the last 72 hours.  Invalid input(s): "FREET3" Anemia work up: No results for input(s): "VITAMINB12", "FOLATE", "FERRITIN", "TIBC", "IRON", "RETICCTPCT" in the last 72 hours. Sepsis Labs: Recent Labs  Lab 09/27/22 1707 09/28/22 0448 09/29/22 0608 09/30/22 0654 09/30/22 1726 10/01/22 0351  WBC  --    < >  25.0* 20.8* 24.8* 19.6*  LATICACIDVEN 1.5  --   --   --   --   --    < > = values in this interval not displayed.    Microbiology Recent Results (from the past 240 hour(s))  Blood culture (routine x 2)     Status: None   Collection Time: 09/27/22  5:07 PM   Specimen: BLOOD  Result Value Ref Range Status   Specimen Description BLOOD RIGHT ANTECUBITAL  Final   Special Requests   Final    BOTTLES DRAWN AEROBIC AND ANAEROBIC Blood  Culture results may not be optimal due to an inadequate volume of blood received in culture bottles   Culture   Final    NO GROWTH 5 DAYS Performed at [facility], [address] [place], [place] [address]    Report Status 10/02/2022 FINAL  Final  Resp panel by RT-PCR (RSV, Flu A&B, Covid) Anterior Nasal Swab     Status: Abnormal   Collection Time: 09/27/22  5:07 PM   Specimen: Anterior Nasal Swab  Result Value Ref Range Status   SARS Coronavirus 2 by RT PCR POSITIVE (A) NEGATIVE Final    Comment: (NOTE) SARS-CoV-2 target nucleic acids are DETECTED.  The SARS-CoV-2 RNA is generally detectable in upper respiratory specimens during the acute phase of infection. Positive results are indicative of the presence of the identified virus, but do not rule out bacterial infection or co-infection with other pathogens not detected by the test. Clinical correlation with patient history and other diagnostic information is necessary to determine patient infection status. The expected result is Negative.  Fact Sheet for Patients: EntrepreneurPulse.com.au  Fact Sheet for Healthcare Providers: IncredibleEmployment.be  This test is not yet approved or cleared by the [place] FDA and  has been authorized for detection and/or diagnosis of SARS-CoV-2 by FDA under an Emergency Use Authorization (EUA).  This EUA will remain in effect (meaning this test can be used) for the duration of  the COVID-19 declaration under Section 564(b)(1) of the A ct, 21 U.S.C. section 360bbb-3(b)(1), unless the authorization is terminated or revoked sooner.     Influenza A by PCR NEGATIVE NEGATIVE Final   Influenza B by PCR NEGATIVE NEGATIVE Final    Comment: (NOTE) The Xpert Xpress SARS-CoV-2/FLU/RSV plus assay is intended as an aid in the diagnosis of influenza from Nasopharyngeal swab specimens and should not be used as a sole basis for treatment. Nasal washings  and aspirates are unacceptable for Xpert Xpress SARS-CoV-2/FLU/RSV testing.  Fact Sheet for Patients: EntrepreneurPulse.com.au  Fact Sheet for Healthcare Providers: IncredibleEmployment.be  This test is not yet approved or cleared by the [place] FDA and has been authorized for detection and/or diagnosis of SARS-CoV-2 by FDA under an Emergency Use Authorization (EUA). This EUA will remain in effect (meaning this test can be used) for the duration of the COVID-19 declaration under Section 564(b)(1) of the Act, 21 U.S.C. section 360bbb-3(b)(1), unless the authorization is terminated or revoked.     Resp Syncytial Virus by PCR NEGATIVE NEGATIVE Final    Comment: (NOTE) Fact Sheet for Patients: EntrepreneurPulse.com.au  Fact Sheet for Healthcare Providers: IncredibleEmployment.be  This test is not yet approved or cleared by the [place] FDA and has been authorized for detection and/or diagnosis of SARS-CoV-2 by FDA under an Emergency Use Authorization (EUA). This EUA will remain in effect (meaning this test can be used) for the duration of the COVID-19 declaration under Section 564(b)(1) of the Act, 21  U.S.C. section 360bbb-3(b)(1), unless the authorization is terminated or revoked.  Performed at [facility], [address], [place] [address]   Blood culture (routine x 2)     Status: None   Collection Time: 09/27/22  5:12 PM   Specimen: BLOOD  Result Value Ref Range Status   Specimen Description BLOOD LEFT ANTECUBITAL  Final   Special Requests   Final    BOTTLES DRAWN AEROBIC AND ANAEROBIC Blood Culture results may not be optimal due to an inadequate volume of blood received in culture bottles   Culture   Final    NO GROWTH 5 DAYS Performed at [facility], [address] [place] [address] [place],  [address]    Report Status 10/02/2022 FINAL  Final    Procedures and  diagnostic studies:  No results found.             LOS: 5 days   Ferrel Simington  [facility]   Pager on [email or web address]. If 7PM-7AM, please contact night-coverage at [email or web address]     10/02/2022, 1:28 PM

## 2022-10-03 DIAGNOSIS — L03116 Cellulitis of left lower limb: Secondary | ICD-10-CM | POA: Diagnosis not present

## 2022-10-03 DIAGNOSIS — N186 End stage renal disease: Secondary | ICD-10-CM | POA: Diagnosis not present

## 2022-10-03 LAB — BASIC METABOLIC PANEL WITH GFR
Anion gap: 14 (ref 5–15)
BUN: 111 mg/dL — ABNORMAL HIGH (ref 6–20)
CO2: 25 mmol/L (ref 22–32)
Calcium: 8.3 mg/dL — ABNORMAL LOW (ref 8.9–10.3)
Chloride: 96 mmol/L — ABNORMAL LOW (ref 98–111)
Creatinine, Ser: 12.84 mg/dL — ABNORMAL HIGH (ref 0.61–1.24)
GFR, Estimated: 4 mL/min — ABNORMAL LOW
Glucose, Bld: 158 mg/dL — ABNORMAL HIGH (ref 70–99)
Potassium: 4.6 mmol/L (ref 3.5–5.1)
Sodium: 135 mmol/L (ref 135–145)

## 2022-10-03 LAB — CBC WITH DIFFERENTIAL/PLATELET
Abs Immature Granulocytes: 0.99 10*3/uL — ABNORMAL HIGH (ref 0.00–0.07)
Basophils Absolute: 0.1 10*3/uL (ref 0.0–0.1)
Basophils Relative: 1 %
Eosinophils Absolute: 0 10*3/uL (ref 0.0–0.5)
Eosinophils Relative: 0 %
HCT: 36.7 % — ABNORMAL LOW (ref 39.0–52.0)
Hemoglobin: 12.3 g/dL — ABNORMAL LOW (ref 13.0–17.0)
Immature Granulocytes: 6 %
Lymphocytes Relative: 11 %
Lymphs Abs: 1.8 10*3/uL (ref 0.7–4.0)
MCH: 30.9 pg (ref 26.0–34.0)
MCHC: 33.5 g/dL (ref 30.0–36.0)
MCV: 92.2 fL (ref 80.0–100.0)
Monocytes Absolute: 1.5 10*3/uL — ABNORMAL HIGH (ref 0.1–1.0)
Monocytes Relative: 10 %
Neutro Abs: 11.2 10*3/uL — ABNORMAL HIGH (ref 1.7–7.7)
Neutrophils Relative %: 72 %
Platelets: 333 10*3/uL (ref 150–400)
RBC: 3.98 MIL/uL — ABNORMAL LOW (ref 4.22–5.81)
RDW: 13.5 % (ref 11.5–15.5)
Smear Review: NORMAL
WBC: 15.5 10*3/uL — ABNORMAL HIGH (ref 4.0–10.5)
nRBC: 0 % (ref 0.0–0.2)

## 2022-10-03 LAB — GLUCOSE, CAPILLARY
Glucose-Capillary: 124 mg/dL — ABNORMAL HIGH (ref 70–99)
Glucose-Capillary: 113 mg/dL — ABNORMAL HIGH (ref 70–99)
Glucose-Capillary: 118 mg/dL — ABNORMAL HIGH (ref 70–99)
Glucose-Capillary: 184 mg/dL — ABNORMAL HIGH (ref 70–99)
Glucose-Capillary: 179 mg/dL — ABNORMAL HIGH (ref 70–99)

## 2022-10-03 LAB — PROTIME-INR
INR: 2.5 — ABNORMAL HIGH (ref 0.8–1.2)
Prothrombin Time: 27 seconds — ABNORMAL HIGH (ref 11.4–15.2)

## 2022-10-03 MED ORDER — WARFARIN SODIUM 10 MG PO TABS
10.0000 mg | ORAL_TABLET | Freq: Once | ORAL | Status: AC
  Administered 2022-10-03: 10 mg via ORAL
  Filled 2022-10-03: qty 1

## 2022-10-03 NOTE — Progress Notes (Signed)
Progress Note    [name]  HWE:[id number] DOB: [birth year]  DOA: 09/27/2022 PCP: [name], MD      Brief Narrative:    Medical records reviewed and are as summarized below:  [name] is a 52 y.o. male with medical history significant for left lower extremity lymphedema, chronic venous insufficiency, remote history of lower extremity cellulitis, PVD, ESRD on hemodialysis, blood clot in dialysis graft on Coumadin, sleep apnea not on CPAP, insulin-dependent diabetes mellitus, hypertension, hypercholesterolemia, chronic anemia, morbid obesity.  He has a left ankle ulcer and he saw Dr. Delana Meyer, vascular surgeon, in the office on 08/19/2022 for this.  Local wound care and tramadol were prescribed at that time.  He noticed increasing pain, redness and swelling in the left leg and foot about a week prior to admission. He went to see Dr. Delana Meyer in the office on 09/26/2022.  He presented to the emergency room for further management.  He also complained of cough, body aches and decreased appetite.  He tested positive for COVID-19 infection on Saturday, 09/25/2022.  He was admitted to the hospital for cellulitis of the left lower extremity.     Assessment/Plan:   Principal Problem:   Cellulitis of left lower extremity Active Problems:   Diabetes mellitus with hyperglycemia (HCC)   Chronic venous insufficiency   Venous ulcer of ankle, left (HCC)   CKD (chronic kidney disease) stage 5, GFR less than 15 ml/min (HCC)   Morbid obesity with BMI of 45.0-49.9, adult (HCC)   End-stage renal disease ([place])   Diabetic ulcer of lower leg ([place])   COVID-19 virus infection   Body mass index is 48.22 kg/m.  (Morbid obesity)    Cellulitis of left lower extremity, chronic venous insufficiency bilateral legs, left leg lymphedema, suspected phlebolymphedema left leg left ankle ulcer, leukocytosis: Consulted Dr. Bridgett Larsson, vascular surgeon, because of bleeding from left leg blister wounds.   Plan to complete IV ceftriaxone and vancomycin today.  Plan to discharge on doxycycline tomorrow.    COVID-19 infection: IV dexamethasone was discontinued on 10/01/2018 after 4 doses..  Completed 3 doses of IV remdesivir on 09/29/2022.    Acute hypoxic respiratory failure: Resolved.  He is tolerating room air.     Insulin-dependent diabetes mellitus with hyperglycemia: Decrease insulin glargine from 20 units to 15 units nightly.  Use NovoLog as needed for hyperglycemia.   ESRD, hyperkalemia: Follow-up with nephrologist for hemodialysis  PVD, history of DVT, history of clot in AV graft: INR is 3.1.  Continue warfarin.  Follow-up with pharmacist for warfarin dose adjustment.  Elevated liver enzymes: Improved   General weakness: PT and OT recommended home health therapy   Diet Order             Diet Carb Modified Fluid consistency: Thin; Room service appropriate? Yes  Diet effective now                            Consultants: Nephrologist  Procedures: None    Medications:    allopurinol  100 mg Oral Daily   bromocriptine  2.5 mg Oral QHS   calcium acetate  2,001 mg Oral TID WC   Chlorhexidine Gluconate Cloth  6 each Topical Q0600   cinacalcet  30 mg Oral Q breakfast   insulin aspart  0-15 Units Subcutaneous TID WC   insulin aspart  0-5 Units Subcutaneous QHS   insulin glargine-yfgn  20 Units Subcutaneous QHS  multivitamin  1 tablet Oral Once per day on Mon Wed Fri   polyethylene glycol  17 g Oral Daily   sodium chloride flush  3 mL Intravenous Q12H   warfarin  10 mg Oral ONCE-1600   Warfarin - Pharmacist Dosing Inpatient   Does not apply q1600   Continuous Infusions:  cefTRIAXone (ROCEPHIN)  IV Stopped (10/02/22 2101)     Anti-infectives (From admission, onward)    Start     Dose/Rate Route Frequency Ordered Stop   09/28/22 1630  cefTRIAXone (ROCEPHIN) 1 g in sodium chloride 0.9 % 100 mL IVPB        1 g 200 mL/hr over 30 Minutes Intravenous Every  24 hours 09/28/22 1532 10/04/22 1629   09/28/22 1200  vancomycin (VANCOCIN) IVPB 1000 mg/200 mL premix        1,000 mg 200 mL/hr over 60 Minutes Intravenous Every T-Th-Sa (Hemodialysis) 09/28/22 0833 10/02/22 1922   09/28/22 1000  remdesivir 100 mg in sodium chloride 0.9 % 100 mL IVPB       See Hyperspace for full Linked Orders Report.   100 mg 200 mL/hr over 30 Minutes Intravenous Daily 09/27/22 2028 09/29/22 0925   09/27/22 2030  remdesivir 200 mg in sodium chloride 0.9% 250 mL IVPB       See Hyperspace for full Linked Orders Report.   200 mg 580 mL/hr over 30 Minutes Intravenous Once 09/27/22 2028 09/27/22 2335   09/27/22 2015  vancomycin (VANCOREADY) IVPB 500 mg/100 mL        500 mg 100 mL/hr over 60 Minutes Intravenous  Once 09/27/22 2008 09/28/22 0101   09/27/22 2007  vancomycin variable dose per unstable renal function (pharmacist dosing)  Status:  Discontinued         Does not apply See admin instructions 09/27/22 2008 09/28/22 0833   09/27/22 1730  vancomycin (VANCOREADY) IVPB 2000 mg/400 mL        2,000 mg 200 mL/hr over 120 Minutes Intravenous  Once 09/27/22 1707 09/27/22 2313   09/27/22 1715  cefTRIAXone (ROCEPHIN) 2 g in sodium chloride 0.9 % 100 mL IVPB        2 g 200 mL/hr over 30 Minutes Intravenous  Once 09/27/22 1707 09/27/22 1933              Family Communication/Anticipated D/C date and plan/Code Status   DVT prophylaxis:  warfarin (COUMADIN) tablet 10 mg     Code Status: Full Code  Family Communication: None Disposition Plan: Plan to discharge home tomorrow   Status is: Inpatient Remains inpatient appropriate because: On IV antibiotics       Subjective:   Interval events noted.  He complains of bleeding from blisters on the left leg.  He still has pain in the left leg although it is a little better.  Objective:    Vitals:   10/02/22 1830 10/02/22 1900 10/02/22 1929 10/03/22 0547  BP: 120/74 (!) 124/106 101/87 107/66  Pulse: 60 (!) 56  78 96  Resp: '18 15 18 18  '$ Temp:   97.8 F (36.6 C) 97.8 F (36.6 C)  TempSrc:   Oral   SpO2: 93% 94% 98% 97%  Weight:      Height:       No data found.   Intake/Output Summary (Last 24 hours) at 10/03/2022 1344 Last data filed at 10/03/2022 0549 Gross per 24 hour  Intake 217.13 ml  Output 1500 ml  Net -1282.87 ml   Filed Weights   09/30/22  1439 09/30/22 1753 10/02/22 1620  Weight: (!) 177.4 kg (!) 175.3 kg (!) 179.7 kg    Exam:  GEN: NAD SKIN: Warm and dry EYES: No pallor or icterus ENT: MMM CV: RRR PULM: CTA B ABD: soft, obese, NT, +BS CNS: AAO x 3, non focal EXT: Lymphedema of the left leg.  Left leg and foot swelling with erythema and tenderness.  Bleeding from ruptured blisters on the left leg               Data Reviewed:   I have personally reviewed following labs and imaging studies:  Labs: Labs show the following:   Basic Metabolic Panel: Recent Labs  Lab 09/27/22 1427 09/28/22 0448 09/30/22 0654 09/30/22 2210 10/01/22 0351 10/02/22 0418 10/03/22 0529  NA 132*   < > 133* 133* 134* 132* 135  K 4.6   < > 5.7* 4.9 5.4* 5.8* 4.6  CL 89*   < > 93* 93* 93* 93* 96*  CO2 24   < > '23 24 25 24 25  '$ GLUCOSE 213*   < > 364* 242* 249* 254* 158*  BUN 73*   < > 115* 93* 104* 125* 111*  CREATININE 13.69*   < > 14.74* 11.94* 12.67* 14.29* 12.84*  CALCIUM 9.1   < > 9.0 8.8* 8.7* 8.5* 8.3*  PHOS 5.9*  --   --  6.5*  --  8.4*  --    < > = values in this interval not displayed.   GFR Estimated Creatinine Clearance: 11.8 mL/min (A) (by C-G formula based on SCr of 12.84 mg/dL (H)). Liver Function Tests: Recent Labs  Lab 09/27/22 1427 09/29/22 0608 09/30/22 2210 10/02/22 0418  AST 50* 26  --   --   ALT 45* 33  --   --   ALKPHOS 121 107  --   --   BILITOT 1.2 1.0  --   --   PROT 8.6* 7.5  --   --   ALBUMIN 3.0* 2.4* 2.5* 2.4*   No results for input(s): "LIPASE", "AMYLASE" in the last 168 hours. No results for input(s): "AMMONIA" in the last 168  hours. Coagulation profile Recent Labs  Lab 09/29/22 0608 09/30/22 0654 10/01/22 0351 10/02/22 0419 10/03/22 0529  INR 4.1* 4.1* 3.3* 3.1* 2.5*    CBC: Recent Labs  Lab 09/29/22 0608 09/30/22 0654 09/30/22 1726 10/01/22 0351 10/02/22 0418 10/03/22 0529  WBC 25.0* 20.8* 24.8* 19.6* 20.2* 15.5*  NEUTROABS 23.6* 19.5*  --  18.2*  --  11.2*  HGB 11.8* 12.2* 11.6* 11.1* 12.4* 12.3*  HCT 35.8* 36.1* 32.9* 32.6* 37.5* 36.7*  MCV 95.2 93.3 89.9 91.6 93.5 92.2  PLT 275 314 340 334 369 333   Cardiac Enzymes: No results for input(s): "CKTOTAL", "CKMB", "CKMBINDEX", "TROPONINI" in the last 168 hours. BNP (last 3 results) No results for input(s): "PROBNP" in the last 8760 hours. CBG: Recent Labs  Lab 10/02/22 1623 10/02/22 2206 10/03/22 0746 10/03/22 1157 10/03/22 1221  GLUCAP 255* 227* 124* 113* 118*   D-Dimer: No results for input(s): "DDIMER" in the last 72 hours. Hgb A1c: No results for input(s): "HGBA1C" in the last 72 hours.  Lipid Profile: No results for input(s): "CHOL", "HDL", "LDLCALC", "TRIG", "CHOLHDL", "LDLDIRECT" in the last 72 hours. Thyroid function studies: No results for input(s): "TSH", "T4TOTAL", "T3FREE", "THYROIDAB" in the last 72 hours.  Invalid input(s): "FREET3" Anemia work up: No results for input(s): "VITAMINB12", "FOLATE", "FERRITIN", "TIBC", "IRON", "RETICCTPCT" in the last 72 hours. Sepsis Labs:  Recent Labs  Lab 09/27/22 1707 09/28/22 0448 09/30/22 1726 10/01/22 0351 10/02/22 0418 10/03/22 0529  WBC  --    < > 24.8* 19.6* 20.2* 15.5*  LATICACIDVEN 1.5  --   --   --   --   --    < > = values in this interval not displayed.    Microbiology Recent Results (from the past 240 hour(s))  Blood culture (routine x 2)     Status: None   Collection Time: 09/27/22  5:07 PM   Specimen: BLOOD  Result Value Ref Range Status   Specimen Description BLOOD RIGHT ANTECUBITAL  Final   Special Requests   Final    BOTTLES DRAWN AEROBIC AND  ANAEROBIC Blood Culture results may not be optimal due to an inadequate volume of blood received in culture bottles   Culture   Final    NO GROWTH 5 DAYS Performed at [facility], [address] [place], [place] [address]    Report Status 10/02/2022 FINAL  Final  Resp panel by RT-PCR (RSV, Flu A&B, Covid) Anterior Nasal Swab     Status: Abnormal   Collection Time: 09/27/22  5:07 PM   Specimen: Anterior Nasal Swab  Result Value Ref Range Status   SARS Coronavirus 2 by RT PCR POSITIVE (A) NEGATIVE Final    Comment: (NOTE) SARS-CoV-2 target nucleic acids are DETECTED.  The SARS-CoV-2 RNA is generally detectable in upper respiratory specimens during the acute phase of infection. Positive results are indicative of the presence of the identified virus, but do not rule out bacterial infection or co-infection with other pathogens not detected by the test. Clinical correlation with patient history and other diagnostic information is necessary to determine patient infection status. The expected result is Negative.  Fact Sheet for Patients: EntrepreneurPulse.com.au  Fact Sheet for Healthcare Providers: IncredibleEmployment.be  This test is not yet approved or cleared by the [place] FDA and  has been authorized for detection and/or diagnosis of SARS-CoV-2 by FDA under an Emergency Use Authorization (EUA).  This EUA will remain in effect (meaning this test can be used) for the duration of  the COVID-19 declaration under Section 564(b)(1) of the A ct, 21 U.S.C. section 360bbb-3(b)(1), unless the authorization is terminated or revoked sooner.     Influenza A by PCR NEGATIVE NEGATIVE Final   Influenza B by PCR NEGATIVE NEGATIVE Final    Comment: (NOTE) The Xpert Xpress SARS-CoV-2/FLU/RSV plus assay is intended as an aid in the diagnosis of influenza from Nasopharyngeal swab specimens and should not be used as a sole basis for treatment.  Nasal washings and aspirates are unacceptable for Xpert Xpress SARS-CoV-2/FLU/RSV testing.  Fact Sheet for Patients: EntrepreneurPulse.com.au  Fact Sheet for Healthcare Providers: IncredibleEmployment.be  This test is not yet approved or cleared by the [place] FDA and has been authorized for detection and/or diagnosis of SARS-CoV-2 by FDA under an Emergency Use Authorization (EUA). This EUA will remain in effect (meaning this test can be used) for the duration of the COVID-19 declaration under Section 564(b)(1) of the Act, 21 U.S.C. section 360bbb-3(b)(1), unless the authorization is terminated or revoked.     Resp Syncytial Virus by PCR NEGATIVE NEGATIVE Final    Comment: (NOTE) Fact Sheet for Patients: EntrepreneurPulse.com.au  Fact Sheet for Healthcare Providers: IncredibleEmployment.be  This test is not yet approved or cleared by the [place] FDA and has been authorized for detection and/or diagnosis of SARS-CoV-2 by FDA under an Emergency Use Authorization (EUA). This  EUA will remain in effect (meaning this test can be used) for the duration of the COVID-19 declaration under Section 564(b)(1) of the Act, 21 U.S.C. section 360bbb-3(b)(1), unless the authorization is terminated or revoked.  Performed at [facility], [address] [place], [place] [address]   Blood culture (routine x 2)     Status: None   Collection Time: 09/27/22  5:12 PM   Specimen: BLOOD  Result Value Ref Range Status   Specimen Description BLOOD LEFT ANTECUBITAL  Final   Special Requests   Final    BOTTLES DRAWN AEROBIC AND ANAEROBIC Blood Culture results may not be optimal due to an inadequate volume of blood received in culture bottles   Culture   Final    NO GROWTH 5 DAYS Performed at [facility], [address], [place], [place] [address]    Report Status 10/02/2022 FINAL  Final     Procedures and diagnostic studies:  No results found.             LOS: 6 days   Malkia Nippert  [facility]   Pager on [email or web address]. If 7PM-7AM, please contact night-coverage at [email or web address]     10/03/2022, 1:44 PM

## 2022-10-03 NOTE — Progress Notes (Signed)
[facility], [place] 10/03/22  Subjective:   LOS: 6  Patient known to our practice from outpatient dialysis.  He was recently diagnosed with COVID-19.  States that he came in because of left leg worsening redness and pain.  He was diagnosed with cellulitis and admitted for IV antibiotics.  Patient seen laying in bed, remains on room air Alert and oriented Left lower extremity dressing removed, bleeding noted from multiple blisters Patient reports improvement with pain since dressing removed  Objective:  Vital signs in last 24 hours:  Temp:  [97.7 F (36.5 C)-97.8 F (36.6 C)] 97.8 F (36.6 C) (02/04 0547) Pulse Rate:  [56-106] 96 (02/04 0547) Resp:  [15-18] 18 (02/04 0547) BP: (101-124)/(45-106) 107/66 (02/04 0547) SpO2:  [93 %-99 %] 97 % (02/04 0547) Weight:  [179.7 kg] 179.7 kg (02/03 1620)  Weight change:  Filed Weights   09/30/22 1439 09/30/22 1753 10/02/22 1620  Weight: (!) 177.4 kg (!) 175.3 kg (!) 179.7 kg    Intake/Output:    Intake/Output Summary (Last 24 hours) at 10/03/2022 1242 Last data filed at 10/03/2022 0549 Gross per 24 hour  Intake 217.13 ml  Output 1500 ml  Net -1282.87 ml     Physical Exam: General:  No acute distress, laying in the bed  HEENT  anicteric, moist oral mucous membrane  Pulm/lungs  normal breathing effort, lungs are clear to auscultation  CVS/Heart  regular rhythm, no rub or gallop  Abdomen:   Soft, nontender  Extremities: Trace -1+ peripheral edema, left leg bandaged.  Neurologic:  Alert, oriented, able to follow commands  Skin:  No acute rashes.  Multiple bleeding blisters on left lower extremity  Dialysis access: Left arm AV graft.    Basic Metabolic Panel:  Recent Labs  Lab 09/27/22 1427 09/28/22 0448 09/30/22 0654 09/30/22 2210 10/01/22 0351 10/02/22 0418 10/03/22 0529  NA 132*   < > 133* 133* 134* 132* 135  K 4.6   < > 5.7* 4.9 5.4* 5.8* 4.6  CL 89*   < > 93* 93* 93* 93* 96*  CO2 24    < > '23 24 25 24 25  '$ GLUCOSE 213*   < > 364* 242* 249* 254* 158*  BUN 73*   < > 115* 93* 104* 125* 111*  CREATININE 13.69*   < > 14.74* 11.94* 12.67* 14.29* 12.84*  CALCIUM 9.1   < > 9.0 8.8* 8.7* 8.5* 8.3*  PHOS 5.9*  --   --  6.5*  --  8.4*  --    < > = values in this interval not displayed.      CBC: Recent Labs  Lab 09/29/22 0608 09/30/22 0654 09/30/22 1726 10/01/22 0351 10/02/22 0418 10/03/22 0529  WBC 25.0* 20.8* 24.8* 19.6* 20.2* 15.5*  NEUTROABS 23.6* 19.5*  --  18.2*  --  11.2*  HGB 11.8* 12.2* 11.6* 11.1* 12.4* 12.3*  HCT 35.8* 36.1* 32.9* 32.6* 37.5* 36.7*  MCV 95.2 93.3 89.9 91.6 93.5 92.2  PLT 275 314 340 334 369 333       Lab Results  Component Value Date   HEPBSAG NON REACTIVE 09/28/2022      Microbiology:  Recent Results (from the past 240 hour(s))  Blood culture (routine x 2)     Status: None   Collection Time: 09/27/22  5:07 PM   Specimen: BLOOD  Result Value Ref Range Status   Specimen Description BLOOD RIGHT ANTECUBITAL  Final   Special Requests   Final  BOTTLES DRAWN AEROBIC AND ANAEROBIC Blood Culture results may not be optimal due to an inadequate volume of blood received in culture bottles   Culture   Final    NO GROWTH 5 DAYS Performed at [facility], [address] [place], [place] [address]    Report Status 10/02/2022 FINAL  Final  Resp panel by RT-PCR (RSV, Flu A&B, Covid) Anterior Nasal Swab     Status: Abnormal   Collection Time: 09/27/22  5:07 PM   Specimen: Anterior Nasal Swab  Result Value Ref Range Status   SARS Coronavirus 2 by RT PCR POSITIVE (A) NEGATIVE Final    Comment: (NOTE) SARS-CoV-2 target nucleic acids are DETECTED.  The SARS-CoV-2 RNA is generally detectable in upper respiratory specimens during the acute phase of infection. Positive results are indicative of the presence of the identified virus, but do not rule out bacterial infection or co-infection with other pathogens not detected by the  test. Clinical correlation with patient history and other diagnostic information is necessary to determine patient infection status. The expected result is Negative.  Fact Sheet for Patients: EntrepreneurPulse.com.au  Fact Sheet for Healthcare Providers: IncredibleEmployment.be  This test is not yet approved or cleared by the [place] FDA and  has been authorized for detection and/or diagnosis of SARS-CoV-2 by FDA under an Emergency Use Authorization (EUA).  This EUA will remain in effect (meaning this test can be used) for the duration of  the COVID-19 declaration under Section 564(b)(1) of the A ct, 21 U.S.C. section 360bbb-3(b)(1), unless the authorization is terminated or revoked sooner.     Influenza A by PCR NEGATIVE NEGATIVE Final   Influenza B by PCR NEGATIVE NEGATIVE Final    Comment: (NOTE) The Xpert Xpress SARS-CoV-2/FLU/RSV plus assay is intended as an aid in the diagnosis of influenza from Nasopharyngeal swab specimens and should not be used as a sole basis for treatment. Nasal washings and aspirates are unacceptable for Xpert Xpress SARS-CoV-2/FLU/RSV testing.  Fact Sheet for Patients: EntrepreneurPulse.com.au  Fact Sheet for Healthcare Providers: IncredibleEmployment.be  This test is not yet approved or cleared by the [place] FDA and has been authorized for detection and/or diagnosis of SARS-CoV-2 by FDA under an Emergency Use Authorization (EUA). This EUA will remain in effect (meaning this test can be used) for the duration of the COVID-19 declaration under Section 564(b)(1) of the Act, 21 U.S.C. section 360bbb-3(b)(1), unless the authorization is terminated or revoked.     Resp Syncytial Virus by PCR NEGATIVE NEGATIVE Final    Comment: (NOTE) Fact Sheet for Patients: EntrepreneurPulse.com.au  Fact Sheet for Healthcare  Providers: IncredibleEmployment.be  This test is not yet approved or cleared by the [place] FDA and has been authorized for detection and/or diagnosis of SARS-CoV-2 by FDA under an Emergency Use Authorization (EUA). This EUA will remain in effect (meaning this test can be used) for the duration of the COVID-19 declaration under Section 564(b)(1) of the Act, 21 U.S.C. section 360bbb-3(b)(1), unless the authorization is terminated or revoked.  Performed at [facility], [address] [place],  [address]   Blood culture (routine x 2)     Status: None   Collection Time: 09/27/22  5:12 PM   Specimen: BLOOD  Result Value Ref Range Status   Specimen Description BLOOD LEFT ANTECUBITAL  Final   Special Requests   Final    BOTTLES DRAWN AEROBIC AND ANAEROBIC Blood Culture results may not be optimal due to an inadequate volume of blood received  in culture bottles   Culture   Final    NO GROWTH 5 DAYS Performed at [facility], [address] [place], [place] [address]    Report Status 10/02/2022 FINAL  Final    Coagulation Studies: Recent Labs    10/01/22 0351 10/02/22 0419 10/03/22 0529  LABPROT 33.6* 31.8* 27.0*  INR 3.3* 3.1* 2.5*     Urinalysis: No results for input(s): "COLORURINE", "LABSPEC", "PHURINE", "GLUCOSEU", "HGBUR", "BILIRUBINUR", "KETONESUR", "PROTEINUR", "UROBILINOGEN", "NITRITE", "LEUKOCYTESUR" in the last 72 hours.  Invalid input(s): "APPERANCEUR"    Imaging: No results found.   Medications:    cefTRIAXone (ROCEPHIN)  IV Stopped (10/02/22 2101)    allopurinol  100 mg Oral Daily   bromocriptine  2.5 mg Oral QHS   calcium acetate  2,001 mg Oral TID WC   Chlorhexidine Gluconate Cloth  6 each Topical Q0600   cinacalcet  30 mg Oral Q breakfast   insulin aspart  0-15 Units Subcutaneous TID WC   insulin aspart  0-5 Units Subcutaneous QHS   insulin glargine-yfgn  20 Units Subcutaneous QHS   multivitamin   1 tablet Oral Once per day on Mon Wed Fri   polyethylene glycol  17 g Oral Daily   sodium chloride flush  3 mL Intravenous Q12H   warfarin  10 mg Oral ONCE-1600   Warfarin - Pharmacist Dosing Inpatient   Does not apply q1600   acetaminophen **OR** acetaminophen, amLODipine, atenolol, bisacodyl, lidocaine-prilocaine, melatonin, oxyCODONE, polyvinyl alcohol, senna-docusate  Assessment/ Plan:  52 y.o. male with end-stage renal disease on hemodialysis, obstructive sleep apnea, insulin-dependent diabetes, hypertension, hyperlipidemia, morbid obesity, left lower extremity lymphedema, chronic venous insufficiency was admitted on 09/27/2022 for  Principal Problem:   Cellulitis of left lower extremity Active Problems:   Diabetes mellitus with hyperglycemia (HCC)   Chronic venous insufficiency   Venous ulcer of ankle, left (HCC)   CKD (chronic kidney disease) stage 5, GFR less than 15 ml/min (HCC)   Morbid obesity with BMI of 45.0-49.9, adult (HCC)   End-stage renal disease (HCC)   Diabetic ulcer of lower leg ([place])   COVID-19 virus infection  Cellulitis [L03.90] Cellulitis of left lower extremity [L03.116] Pneumonia due to COVID-19 virus [U07.1, J12.82]  #. ESRD [facility] dialysis/TTS / EDW 180 kg Received dialysis yesterday, UF 1.5 L achieved.  Next treatment scheduled for Tuesday.   #. Anemia of CKD  Lab Results  Component Value Date   HGB 12.3 (L) 10/03/2022  Hemoglobin stable for this patient. No need for ESA's at this time.  #. Secondary hyperparathyroidism of renal origin N 25.81   No results found for: "PTH" Lab Results  Component Value Date   PHOS 8.4 (H) 10/02/2022   Calcium remains acceptable however phosphorus elevated.  Continue calcium acetate and Cinacalcet.   #. Diabetes type 2 with CKD Hgb A1c MFr Bld (%)  Date Value  09/27/2022 8.8 (H)   Glucose well-controlled today.  Sliding scale insulin managed by primary team.  #Left leg  cellulitis, with underlying lymphedema. Continue vancomycin with dialysis and Rocephin, per primary team.  Due to presence of multiple blisters with profuse bleeding, surgery has been consulted to evaluate need of an I&D.[name]  Covid -19 Supportive care, patient reports symptoms improving.      LOS: [place] 2/4/202412:42 PM  [facility] [place], [place]

## 2022-10-03 NOTE — Consult Note (Signed)
VASCULAR SURGERY CONSULTATION   Requested by:  Jennye Boroughs, MD  Reason for consultation: LEFT leg wounds    History of Present Illness   [name] is a 52 y.o. (1970/09/15) male who presents with cc: cellulitis is setting of chronic LEFT VSU.  Pt was seen by Dr. [name] in the office on 09/27/22 who felt pt's LLE ulcer had progressed and pt now had cellulitis.  Pt went to the ED for admission.  Vascular Surgery was not consulted until today.  Pt notes drainage from blisters in LLE.  Pt denies any fever or chills.  Past Medical History:  Diagnosis Date   Anemia    Chronic kidney disease    14% FUNCTION   Diabetes mellitus without complication ([place])    Diabetic retinopathy ([place])    Dyspnea    DOE   Dysrhythmia    End stage renal disease ([place])    History of orthopnea    error wrong patietn   Hypercholesteremia    error   Hyperkalemia    error wrong patient   Hypertension    Long-term insulin use ([place])    Lymphedema of leg    Metabolic encephalopathy    error   Microalbuminuria    MSSA (methicillin susceptible Staphylococcus aureus)    Obesity    PVD (peripheral vascular disease) (HCC)    Respiratory failure (HCC)    Error   Sepsis ([place])    error   Sleep apnea    no CPAP   UTI (urinary tract infection)    error   Venous ulcer ([place])    Vitamin D deficiency     Past Surgical History:  Procedure Laterality Date   A/V FISTULAGRAM Right 10/24/2018   Procedure: A/V FISTULAGRAM;  Surgeon: Katha Cabal, MD;  Location: [facility];  Service: Cardiovascular;  Laterality: Right;   A/V FISTULAGRAM Right 11/24/2018   Procedure: A/V FISTULAGRAM;  Surgeon: Katha Cabal, MD;  Location: [facility];  Service: Cardiovascular;  Laterality: Right;   A/V FISTULAGRAM Left 07/31/2019   Procedure: A/V FISTULAGRAM;  Surgeon: Katha Cabal, MD;  Location: [facility];  Service: Cardiovascular;  Laterality: Left;   A/V FISTULAGRAM  Left 12/11/2019   Procedure: A/V FISTULAGRAM;  Surgeon: Katha Cabal, MD;  Location: [facility];  Service: Cardiovascular;  Laterality: Left;   A/V SHUNT INTERVENTION Right 02/21/2019   Procedure: A/V SHUNT INTERVENTION;  Surgeon: Katha Cabal, MD;  Location: [facility];  Service: Cardiovascular;  Laterality: Right;   APPLICATION OF WOUND VAC Left 10/03/2017   Procedure: APPLICATION OF WOUND VAC;  Surgeon: Algernon Huxley, MD;  Location: [facility];  Service: General;  Laterality: Left;   APPLICATION OF WOUND VAC Left 10/11/2017   Procedure: APPLICATION OF WOUND VAC;  Surgeon: Katha Cabal, MD;  Location: [facility];  Service: Vascular;  Laterality: Left;   APPLICATION OF WOUND VAC Left 10/14/2017   Procedure: WOUND VAC CHANGE;  Surgeon: Katha Cabal, MD;  Location: [facility];  Service: Vascular;  Laterality: Left;  left lower leg   APPLICATION OF WOUND VAC Left 10/18/2017   Procedure: WOUND VAC CHANGE;  Surgeon: Katha Cabal, MD;  Location: [facility];  Service: Vascular;  Laterality: Left;   APPLICATION OF WOUND VAC Left 10/07/2017   Procedure: APPLICATION OF WOUND VAC;  Surgeon: Katha Cabal, MD;  Location: [facility];  Service: Vascular;  Laterality: Left;  AV FISTULA INSERTION W/ RF MAGNETIC GUIDANCE Right 07/19/2018   Procedure: AV FISTULA INSERTION W/RF MAGNETIC GUIDANCE;  Surgeon: Katha Cabal, MD;  Location: [facility];  Service: Cardiovascular;  Laterality: Right;   AV FISTULA PLACEMENT Left 05/11/2019   Procedure: ARTERIOVENOUS (AV) FISTULA CREATION (RADIOCEPHALIC );  Surgeon: Katha Cabal, MD;  Location: [facility];  Service: Vascular;  Laterality: Left;   AV FISTULA PLACEMENT Left 02/20/2020   Procedure: INSERTION OF ARTERIOVENOUS (AV) GORE-TEX GRAFT LEFT ARM ( FOREARM LOOP );  Surgeon: Katha Cabal, MD;  Location: [facility];  Service: Vascular;  Laterality: Left;   CATARACT EXTRACTION W/PHACO Left 11/01/2018   Procedure:  CATARACT EXTRACTION PHACO AND INTRAOCULAR LENS PLACEMENT (IOC)-LEFT, DIABETIC-INSULIN DEPENDENT;  Surgeon: Eulogio Bear, MD;  Location: [facility];  Service: Ophthalmology;  Laterality: Left;  [place] 00:41.8 CDE 4.61 Fluid Pack Lot # [id number]   CATARACT EXTRACTION W/PHACO Right 04/27/2019   Procedure: CATARACT EXTRACTION PHACO AND INTRAOCULAR LENS PLACEMENT (IOC);  Surgeon: Eulogio Bear, MD;  Location: [facility];  Service: Ophthalmology;  Laterality: Right;  [place] 00:34 CDE 1.97 FLUID PACK LOT # [id number]    COLONOSCOPY WITH PROPOFOL N/A 05/12/2020   Procedure: COLONOSCOPY WITH PROPOFOL;  Surgeon: [place], Benay Pike, MD;  Location: [facility];  Service: Gastroenterology;  Laterality: N/A;   DIALYSIS/PERMA CATHETER INSERTION N/A 02/23/2019   Procedure: DIALYSIS/PERMA CATHETER INSERTION;  Surgeon: Algernon Huxley, MD;  Location: [facility];  Service: Cardiovascular;  Laterality: N/A;   DIALYSIS/PERMA CATHETER REMOVAL N/A 04/03/2020   Procedure: DIALYSIS/PERMA CATHETER REMOVAL;  Surgeon: Katha Cabal, MD;  Location: [facility];  Service: Cardiovascular;  Laterality: N/A;   DIALYSIS/PERMA CATHETER REMOVAL N/A 02/03/2022   Procedure: DIALYSIS/PERMA CATHETER REMOVAL;  Surgeon: Katha Cabal, MD;  Location: [facility];  Service: Cardiovascular;  Laterality: N/A;   I & D EXTREMITY Left 10/11/2017   Procedure: IRRIGATION AND DEBRIDEMENT EXTREMITY;  Surgeon: Katha Cabal, MD;  Location: [facility];  Service: Vascular;  Laterality: Left;   INCISION AND DRAINAGE ABSCESS Right 10/07/2017   Procedure: INCISION AND DRAINAGE ABSCESS;  Surgeon: Katha Cabal, MD;  Location: [facility];  Service: Vascular;  Laterality: Right;   IRRIGATION AND DEBRIDEMENT ABSCESS Left 10/03/2017   Procedure: IRRIGATION AND DEBRIDEMENT ABSCESS with debridement of skin, soft tissue, muscle 50sq cm;  Surgeon: Algernon Huxley, MD;  Location: [facility];  Service: General;  Laterality: Left;   TEMPORARY  DIALYSIS CATHETER  02/20/2019   Procedure: TEMPORARY DIALYSIS CATHETER;  Surgeon: Katha Cabal, MD;  Location: [facility];  Service: Cardiovascular;;   UPPER EXTREMITY ANGIOGRAPHY Left 09/18/2019   Procedure: UPPER EXTREMITY ANGIOGRAPHY;  Surgeon: Katha Cabal, MD;  Location: [facility];  Service: Cardiovascular;  Laterality: Left;     Social History   Socioeconomic History   Marital status: Divorced    Spouse name: Not on file   Number of children: Not on file   Years of education: Not on file   Highest education level: Not on file  Occupational History   Not on file  Tobacco Use   Smoking status: Former    Types: Cigars    Passive exposure: Past   Smokeless tobacco: Former    Quit date: 09/30/2017   Tobacco comments:    occasional  Vaping Use   Vaping Use: Never used  Substance and Sexual Activity   Alcohol use: Yes    Alcohol/week: 1.0 standard  drink of alcohol    Types: 1 Cans of beer per week    Comment: beer   Drug use: Not Currently   Sexual activity: Yes  Other Topics Concern   Not on file  Social History Narrative   Not on file   Social Determinants of Health   Financial Resource Strain: Not on file  Food Insecurity: No Food Insecurity (09/28/2022)   Hunger Vital Sign    Worried About Running Out of Food in the Last Year: Never true    Ran Out of Food in the Last Year: Never true  Transportation Needs: No Transportation Needs (09/28/2022)   PRAPARE - Hydrologist (Medical): No    Lack of Transportation (Non-Medical): No  Physical Activity: Not on file  Stress: Not on file  Social Connections: Not on file  Intimate Partner Violence: Not At Risk (09/28/2022)   Humiliation, Afraid, Rape, and Kick questionnaire    Fear of Current or Ex-Partner: No    Emotionally Abused: No    Physically Abused: No    Sexually Abused: No    Family History  Problem Relation Age of Onset   Diabetes Father    Kidney  disease Father    Diabetes Daughter     Current Facility-Administered Medications  Medication Dose Route Frequency Provider Last Rate Last Admin   acetaminophen (TYLENOL) tablet 650 mg  650 mg Oral Q6H PRN Gertie Fey, MD       Or   acetaminophen (TYLENOL) suppository 650 mg  650 mg Rectal Q6H PRN Gertie Fey, MD       allopurinol (ZYLOPRIM) tablet 100 mg  100 mg Oral Daily Gertie Fey, MD   100 mg at 10/03/22 0839   amLODipine (NORVASC) tablet 5 mg  5 mg Oral Daily PRN Gertie Fey, MD       atenolol (TENORMIN) tablet 25 mg  25 mg Oral Daily PRN Gertie Fey, MD       bisacodyl (DULCOLAX) suppository 10 mg  10 mg Rectal Daily PRN Jennye Boroughs, MD       bromocriptine (PARLODEL) tablet 2.5 mg  2.5 mg Oral QHS Gertie Fey, MD   2.5 mg at 10/02/22 2234   calcium acetate (PHOSLO) capsule 2,001 mg  2,001 mg Oral TID WC Gertie Fey, MD   2,001 mg at 10/03/22 0839   cefTRIAXone (ROCEPHIN) 1 g in sodium chloride 0.9 % 100 mL IVPB  1 g Intravenous Q24H Jennye Boroughs, MD   Stopped at 10/02/22 2101   Chlorhexidine Gluconate Cloth 2 % PADS 6 each  6 each Topical Q0600 Murlean Iba, MD   6 each at 10/03/22 0548   cinacalcet (SENSIPAR) tablet 30 mg  30 mg Oral Q breakfast Gertie Fey, MD   30 mg at 10/03/22 0839   insulin aspart (novoLOG) injection 0-15 Units  0-15 Units Subcutaneous TID WC Gertie Fey, MD   2 Units at 10/03/22 0839   insulin aspart (novoLOG) injection 0-5 Units  0-5 Units Subcutaneous QHS Gertie Fey, MD   2 Units at 10/02/22 2235   insulin glargine-yfgn (SEMGLEE) injection 20 Units  20 Units Subcutaneous QHS Jennye Boroughs, MD   20 Units at 10/02/22 2235   lidocaine-prilocaine (EMLA) cream 1 Application  1 Application Topical PRN Gertie Fey, MD       melatonin tablet 5 mg  5 mg Oral QHS PRN Gertie Fey, MD   5 mg at 10/02/22 2234   multivitamin (RENA-VIT) tablet 1 tablet  1  tablet Oral Once per day on Mon Wed Fri Goel, Hersh, MD   1 tablet at 10/01/22 0254   oxyCODONE (Oxy  IR/ROXICODONE) immediate release tablet 5 mg  5 mg Oral Q8H PRN Jennye Boroughs, MD   5 mg at 10/03/22 2706   polyethylene glycol (MIRALAX / GLYCOLAX) packet 17 g  17 g Oral Daily Jennye Boroughs, MD   17 g at 10/03/22 0840   polyvinyl alcohol (LIQUIFILM TEARS) 1.4 % ophthalmic solution   Both Eyes QID PRN Gertie Fey, MD       senna-docusate (Senokot-S) tablet 1 tablet  1 tablet Oral BID PRN Jennye Boroughs, MD   1 tablet at 10/01/22 1215   sodium chloride flush (NS) 0.9 % injection 3 mL  3 mL Intravenous Q12H Gertie Fey, MD   3 mL at 10/03/22 0840   warfarin (COUMADIN) tablet 10 mg  10 mg Oral ONCE-1600 Pearla Dubonnet, [place]       Warfarin - Pharmacist Dosing Inpatient   Does not apply q1600 Darrick Penna, [facility]   Given at 10/02/22 2034    Allergies  Allergen Reactions   Lisinopril Swelling    Lips mouth    Furosemide Cough    Light headed, blurry vision, weakness.   Latex    Adhesive [Tape] Itching and Rash    REVIEW OF SYSTEMS (negative unless checked):   Cardiac:  '[]'$  Chest pain or chest pressure? '[]'$  Shortness of breath upon activity? '[]'$  Shortness of breath when lying flat? '[]'$  Irregular heart rhythm?  Vascular:  '[x]'$  Pain in calf, thigh, or hip brought on by walking? '[]'$  Pain in feet at night that wakes you up from your sleep? '[]'$  Blood clot in your veins? '[x]'$  Leg swelling?  Pulmonary:  '[]'$  Oxygen at home? '[]'$  Productive cough? '[]'$  Wheezing?  Neurologic:  '[]'$  Sudden weakness in arms or legs? '[]'$  Sudden numbness in arms or legs? '[]'$  Sudden onset of difficult speaking or slurred speech? '[]'$  Temporary loss of vision in one eye? '[]'$  Problems with dizziness?  Gastrointestinal:  '[]'$  Blood in stool? '[]'$  Vomited blood?  Genitourinary:  '[]'$  Burning when urinating? '[]'$  Blood in urine?  Psychiatric:  '[]'$  Major depression  Hematologic:  '[]'$  Bleeding problems? '[]'$  Problems with blood clotting?  Dermatologic:  '[]'$  Rashes or ulcers?  Constitutional:  '[]'$  Fever or  chills?  Ear/Nose/Throat:  '[]'$  Change in hearing? '[]'$  Nose bleeds? '[]'$  Sore throat?  Musculoskeletal:  '[]'$  Back pain? '[]'$  Joint pain? '[]'$  Muscle pain?   Physical Examination     Vitals:   10/02/22 1830 10/02/22 1900 10/02/22 1929 10/03/22 0547  BP: 120/74 (!) 124/106 101/87 107/66  Pulse: 60 (!) 56 78 96  Resp: '18 15 18 18  '$ Temp:   97.8 F (36.6 C) 97.8 F (36.6 C)  TempSrc:   Oral   SpO2: 93% 94% 98% 97%  Weight:      Height:       Body mass index is 48.22 kg/m.  General Alert, O x 3,  elevated BMI , NAD  Vascular Palpable BUE pulses, faintly palpable B femoral pulses, no palpable pedal pulses due to edema  Musculo- skeletal All limbs viable, BLE 3+ edema, LLE: extensive decompressing blisters in calf with serosang drainage, blister on dorsum of foot, no ascending cellulitis, TTP throughout  Neurologic Sensation and motor intact in both feet    Non-invasive Vascular Imaging     BLE venous duplex (09/27/22): no DVT   Laboratory   CBC    Latest  Ref Rng & Units 10/03/2022    5:29 AM 10/02/2022    4:18 AM 10/01/2022    3:51 AM  CBC  WBC 4.0 - 10.5 K/uL 15.5  20.2  19.6   Hemoglobin 13.0 - 17.0 g/dL 12.3  12.4  11.1   Hematocrit 39.0 - 52.0 % 36.7  37.5  32.6   Platelets 150 - 400 K/uL 333  369  334     BMP    Latest Ref Rng & Units 10/03/2022    5:29 AM 10/02/2022    4:18 AM 10/01/2022    3:51 AM  BMP  Glucose 70 - 99 mg/dL 158  254  249   BUN 6 - 20 mg/dL 111  125  104   Creatinine 0.61 - 1.24 mg/dL 12.84  14.29  12.67   Sodium 135 - 145 mmol/L 135  132  134   Potassium 3.5 - 5.1 mmol/L 4.6  5.8  5.4   Chloride 98 - 111 mmol/L 96  93  93   CO2 22 - 32 mmol/L '25  24  25   '$ Calcium 8.9 - 10.3 mg/dL 8.3  8.5  8.7     Coagulation Lab Results  Component Value Date   INR 2.5 (H) 10/03/2022   INR 3.1 (H) 10/02/2022   INR 3.3 (H) 10/01/2022   No results found for: "PTT"  Lipids No results found for: "CHOL", "TRIG", "HDL", "CHOLHDL", "VLDL", "LDLCALC",  "LDLDIRECT"   Radiology     No results found.   Medical Decision Making   [name] is a 52 y.o. male who presents with: BLE edema with secondary cellulitis  Suspect pt's BLE to be multifactorial including imminent ESRD and possibly CVI Physical findings in L leg are suspicious for phlebolymphedema Pt is likely to require chronic compressive therapy including possible decongestive massage in the future IV abx Wound care including compression Wash off calf daily Aquacel Ag to all open wounds and blisters Kerlix to help affix bandages Gentle ACE bandage initially, tighten as tolerated Once skin is healing will need to transition to either a ProFore compression system vs unna boot BLE ABI once wounds healed enough to evaluate perfusion Dr. [name] will be back tomorrow Thank you for allowing [place] to participate in this patient's care.   Adele Barthel, MD, FACS, FSVS Covering for [facility]: [phone]  10/03/2022, 11:08 AM

## 2022-10-03 NOTE — Consult Note (Signed)
ANTICOAGULATION CONSULT NOTE - Follow Up Consult  Pharmacy Consult for Warfarin Indication: DVT  Allergies  Allergen Reactions   Lisinopril Swelling    Lips mouth    Furosemide Cough    Light headed, blurry vision, weakness.   Latex    Adhesive [Tape] Itching and Rash    Patient Measurements: Height: '6\' 4"'$  (193 cm) Weight: (!) 179.7 kg (396 lb 2.7 oz) IBW/kg (Calculated) : 86.8   Vital Signs: Temp: 97.8 F (36.6 C) (02/04 0547) BP: 107/66 (02/04 0547) Pulse Rate: 96 (02/04 0547)  Labs: Recent Labs    10/01/22 0351 10/02/22 0418 10/02/22 0419 10/03/22 0529  HGB 11.1* 12.4*  --  12.3*  HCT 32.6* 37.5*  --  36.7*  PLT 334 369  --  333  LABPROT 33.6*  --  31.8* 27.0*  INR 3.3*  --  3.1* 2.5*  CREATININE 12.67* 14.29*  --  12.84*     Estimated Creatinine Clearance: 11.8 mL/min (A) (by C-G formula based on SCr of 12.84 mg/dL (H)).   Medications:  Facility-Administered Medications Prior to Admission  Medication Dose Route Frequency Provider Last Rate Last Admin   [DISCONTINUED] 0.9 %  sodium chloride infusion   Intravenous PRN Causey, Charlestine Massed, NP       Medications Prior to Admission  Medication Sig Dispense Refill Last Dose   allopurinol (ZYLOPRIM) 100 MG tablet Take 100 mg by mouth daily.   09/26/2022   B Complex-C-Folic Acid (RENA-VITE RX) 1 MG TABS Take 1 tablet by mouth 3 (three) times a week.    Past Week   bromocriptine (PARLODEL) 2.5 MG tablet TAKE 1 TABLET BY MOUTH AT BEDTIME. 90 tablet 3 Past Month   calcium acetate (PHOSLO) 667 MG capsule Take 1,334-2,001 mg by mouth See admin instructions. Take 2001 mg by mouth with each meal & take 1334 mg by mouth with each snack.   09/26/2022   cinacalcet (SENSIPAR) 30 MG tablet Take 30 mg by mouth daily.   09/26/2022   CINNAMON PO Take 500 mg by mouth 2 (two) times daily.    09/26/2022   Insulin Glargine (LANTUS SOLOSTAR) 100 UNIT/ML Solostar Pen Inject 15 Units into the skin at bedtime.    09/26/2022   Multiple  Vitamin (MULTIVITAMIN WITH MINERALS) TABS tablet Take 1 tablet by mouth 3 (three) times a week. Men's Multivitamin   Past Week   Multiple Vitamins-Minerals (EYE HEALTH PO) Take 1 capsule by mouth daily.    09/26/2022   warfarin (COUMADIN) 5 MG tablet Take 10 mg by mouth daily.    09/26/2022   amLODipine (NORVASC) 10 MG tablet Take 5 mg by mouth daily as needed (if bp is 190/90 or higher).    prn at prn   atenolol (TENORMIN) 50 MG tablet Take 25 mg by mouth daily as needed (if bp is 190/90 or higher).    prn at prn   ibuprofen (ADVIL) 200 MG tablet Take 200 mg by mouth every 6 (six) hours as needed for headache or moderate pain.   prn at prn   lidocaine-prilocaine (EMLA) cream Apply 1 application topically as needed (port access).   prn at prn   loratadine (CLARITIN) 10 MG tablet Take 10 mg by mouth daily as needed for allergies.   prn at prn   melatonin 5 MG TABS Take 5 mg by mouth at bedtime as needed (sleep).    prn at prn   Polyethyl Glycol-Propyl Glycol (SYSTANE OP) Place 1 drop into both eyes daily as  needed (dry eyes).      sildenafil (VIAGRA) 100 MG tablet Take 1 tablet (100 mg total) by mouth daily as needed for erectile dysfunction. 30 tablet 3 prn at prn   traMADol (ULTRAM) 50 MG tablet Take 1 tablet (50 mg total) by mouth at bedtime as needed for moderate pain or severe pain. 25 tablet 0 prn at prn    Assessment: [name] is a 52 YO M with a PMH of ESRD on dialysis, DM, HLN, and lymphedema of the left LE with prior remote leg cellulitis. Was diagnosed to have progressive cellulitis, DVT, necrotizing infection, osteomyelitis and positive for COVID. [name] was on warfarin previously for blood clots during dialysis treatment. Pharmacy has been consulted for warfarin therapy.   1/29: '10mg'$  PO x 1  1/30: HOLD 1/31: INR: 3.4>4.1.HOLD  2/01: INR: 4.1>>4.1 HOLD  2/02: INR: 4.1>3.3, 2.'5mg'$   2/03: INR 3.3>3.1 2/03: INR 3.1>2.5  Goal of Therapy:  INR 2-3 Monitor platelets by anticoagulation protocol:  Yes   Plan:  - Will give '10mg'$  warfarin x1 today(home dose).  - Check INR with am labs.   Pearla Dubonnet, PharmD Clinical Pharmacist 10/03/2022 8:57 AM

## 2022-10-04 DIAGNOSIS — L03116 Cellulitis of left lower limb: Secondary | ICD-10-CM | POA: Diagnosis not present

## 2022-10-04 DIAGNOSIS — N186 End stage renal disease: Secondary | ICD-10-CM | POA: Diagnosis not present

## 2022-10-04 LAB — CBC WITH DIFFERENTIAL/PLATELET
Abs Immature Granulocytes: 1.11 10*3/uL — ABNORMAL HIGH (ref 0.00–0.07)
Basophils Absolute: 0.1 10*3/uL (ref 0.0–0.1)
Basophils Relative: 1 %
Eosinophils Absolute: 0.1 10*3/uL (ref 0.0–0.5)
Eosinophils Relative: 0 %
HCT: 34.9 % — ABNORMAL LOW (ref 39.0–52.0)
Hemoglobin: 11.9 g/dL — ABNORMAL LOW (ref 13.0–17.0)
Immature Granulocytes: 6 %
Lymphocytes Relative: 8 %
Lymphs Abs: 1.5 10*3/uL (ref 0.7–4.0)
MCH: 31.4 pg (ref 26.0–34.0)
MCHC: 34.1 g/dL (ref 30.0–36.0)
MCV: 92.1 fL (ref 80.0–100.0)
Monocytes Absolute: 1.7 10*3/uL — ABNORMAL HIGH (ref 0.1–1.0)
Monocytes Relative: 9 %
Neutro Abs: 15.1 10*3/uL — ABNORMAL HIGH (ref 1.7–7.7)
Neutrophils Relative %: 76 %
Platelets: 311 10*3/uL (ref 150–400)
RBC: 3.79 MIL/uL — ABNORMAL LOW (ref 4.22–5.81)
RDW: 13.6 % (ref 11.5–15.5)
WBC: 19.6 10*3/uL — ABNORMAL HIGH (ref 4.0–10.5)
nRBC: 0 % (ref 0.0–0.2)

## 2022-10-04 LAB — BASIC METABOLIC PANEL
Anion gap: 15 (ref 5–15)
BUN: 126 mg/dL — ABNORMAL HIGH (ref 6–20)
CO2: 24 mmol/L (ref 22–32)
Calcium: 8 mg/dL — ABNORMAL LOW (ref 8.9–10.3)
Chloride: 96 mmol/L — ABNORMAL LOW (ref 98–111)
Creatinine, Ser: 14.3 mg/dL — ABNORMAL HIGH (ref 0.61–1.24)
GFR, Estimated: 4 mL/min — ABNORMAL LOW (ref >60)
Glucose, Bld: 151 mg/dL — ABNORMAL HIGH (ref 70–99)
Potassium: 5 mmol/L (ref 3.5–5.1)
Sodium: 135 mmol/L (ref 135–145)

## 2022-10-04 LAB — GLUCOSE, CAPILLARY
Glucose-Capillary: 124 mg/dL — ABNORMAL HIGH (ref 70–99)
Glucose-Capillary: 97 mg/dL (ref 70–99)
Glucose-Capillary: 135 mg/dL — ABNORMAL HIGH (ref 70–99)
Glucose-Capillary: 191 mg/dL — ABNORMAL HIGH (ref 70–99)

## 2022-10-04 LAB — PROTIME-INR
INR: 2.4 — ABNORMAL HIGH (ref 0.8–1.2)
Prothrombin Time: 26 seconds — ABNORMAL HIGH (ref 11.4–15.2)

## 2022-10-04 MED ORDER — WARFARIN SODIUM 10 MG PO TABS
10.0000 mg | ORAL_TABLET | Freq: Once | ORAL | Status: AC
  Administered 2022-10-04: 10 mg via ORAL
  Filled 2022-10-04: qty 1

## 2022-10-04 MED ORDER — DOXYCYCLINE HYCLATE 100 MG PO TABS: 100.0000 mg | ORAL_TABLET | Freq: Two times a day (BID) | ORAL | Status: DC

## 2022-10-04 MED ORDER — DOXYCYCLINE HYCLATE 100 MG PO TABS
100.0000 mg | ORAL_TABLET | Freq: Two times a day (BID) | ORAL | Status: DC
  Administered 2022-10-04 – 2022-10-05 (×3): 100 mg via ORAL
  Filled 2022-10-04 (×3): qty 1

## 2022-10-04 NOTE — Progress Notes (Addendum)
[facility], [place] 10/04/22  Subjective:   LOS: 7  Patient known to our practice from outpatient dialysis.  He was recently diagnosed with COVID-19.  States that he came in because of left leg worsening redness and pain.  He was diagnosed with cellulitis and admitted for IV antibiotics.  Patient laying in bed States he was told to not eat due to possible vascular procedure.  Reports burning sensation from left lower extremity   Objective:  Vital signs in last 24 hours:  Temp:  [97.8 F (36.6 C)-98.9 F (37.2 C)] 98.1 F (36.7 C) (02/05 0812) Pulse Rate:  [62-109] 82 (02/05 0812) Resp:  [18-19] 18 (02/05 0812) BP: (98-138)/(54-76) 118/76 (02/05 0812) SpO2:  [92 %-98 %] 95 % (02/05 0812)  Weight change:  Filed Weights   09/30/22 1439 09/30/22 1753 10/02/22 1620  Weight: (!) 177.4 kg (!) 175.3 kg (!) 179.7 kg    Intake/Output:    Intake/Output Summary (Last 24 hours) at 10/04/2022 1453 Last data filed at 10/03/2022 2300 Gross per 24 hour  Intake 100 ml  Output --  Net 100 ml     Physical Exam: General:  No acute distress, laying in the bed  HEENT  anicteric, moist oral mucous membrane  Pulm/lungs  normal breathing effort, lungs are clear to auscultation  CVS/Heart  regular rhythm, no rub or gallop  Abdomen:   Soft, nontender  Extremities: Trace -1+ peripheral edema, left leg bandaged.  Neurologic:  Alert, oriented, able to follow commands  Skin:  No acute rashes.  LLE ACE bandage  Dialysis access: Left arm AV graft.    Basic Metabolic Panel:  Recent Labs  Lab 09/30/22 2210 10/01/22 0351 10/02/22 0418 10/03/22 0529 10/04/22 0402  NA 133* 134* 132* 135 135  K 4.9 5.4* 5.8* 4.6 5.0  CL 93* 93* 93* 96* 96*  CO2 '24 25 24 25 24  '$ GLUCOSE 242* 249* 254* 158* 151*  BUN 93* 104* 125* 111* 126*  CREATININE 11.94* 12.67* 14.29* 12.84* 14.30*  CALCIUM 8.8* 8.7* 8.5* 8.3* 8.0*  PHOS 6.5*  --  8.4*  --   --       CBC: Recent Labs   Lab 09/29/22 0608 09/30/22 0654 09/30/22 1726 10/01/22 0351 10/02/22 0418 10/03/22 0529 10/04/22 0402  WBC 25.0* 20.8* 24.8* 19.6* 20.2* 15.5* 19.6*  NEUTROABS 23.6* 19.5*  --  18.2*  --  11.2* 15.1*  HGB 11.8* 12.2* 11.6* 11.1* 12.4* 12.3* 11.9*  HCT 35.8* 36.1* 32.9* 32.6* 37.5* 36.7* 34.9*  MCV 95.2 93.3 89.9 91.6 93.5 92.2 92.1  PLT 275 314 340 334 369 333 311       Lab Results  Component Value Date   HEPBSAG NON REACTIVE 09/28/2022      Microbiology:  Recent Results (from the past 240 hour(s))  Blood culture (routine x 2)     Status: None   Collection Time: 09/27/22  5:07 PM   Specimen: BLOOD  Result Value Ref Range Status   Specimen Description BLOOD RIGHT ANTECUBITAL  Final   Special Requests   Final    BOTTLES DRAWN AEROBIC AND ANAEROBIC Blood Culture results may not be optimal due to an inadequate volume of blood received in culture bottles   Culture   Final    NO GROWTH 5 DAYS Performed at [facility], [address] [place], [place] [address]    Report Status 10/02/2022 FINAL  Final  Resp panel by RT-PCR (RSV, Flu A&B, Covid) Anterior Nasal Swab  Status: Abnormal   Collection Time: 09/27/22  5:07 PM   Specimen: Anterior Nasal Swab  Result Value Ref Range Status   SARS Coronavirus 2 by RT PCR POSITIVE (A) NEGATIVE Final    Comment: (NOTE) SARS-CoV-2 target nucleic acids are DETECTED.  The SARS-CoV-2 RNA is generally detectable in upper respiratory specimens during the acute phase of infection. Positive results are indicative of the presence of the identified virus, but do not rule out bacterial infection or co-infection with other pathogens not detected by the test. Clinical correlation with patient history and other diagnostic information is necessary to determine patient infection status. The expected result is Negative.  Fact Sheet for Patients: EntrepreneurPulse.com.au  Fact Sheet for Healthcare  Providers: IncredibleEmployment.be  This test is not yet approved or cleared by the [place] FDA and  has been authorized for detection and/or diagnosis of SARS-CoV-2 by FDA under an Emergency Use Authorization (EUA).  This EUA will remain in effect (meaning this test can be used) for the duration of  the COVID-19 declaration under Section 564(b)(1) of the A ct, 21 U.S.C. section 360bbb-3(b)(1), unless the authorization is terminated or revoked sooner.     Influenza A by PCR NEGATIVE NEGATIVE Final   Influenza B by PCR NEGATIVE NEGATIVE Final    Comment: (NOTE) The Xpert Xpress SARS-CoV-2/FLU/RSV plus assay is intended as an aid in the diagnosis of influenza from Nasopharyngeal swab specimens and should not be used as a sole basis for treatment. Nasal washings and aspirates are unacceptable for Xpert Xpress SARS-CoV-2/FLU/RSV testing.  Fact Sheet for Patients: EntrepreneurPulse.com.au  Fact Sheet for Healthcare Providers: IncredibleEmployment.be  This test is not yet approved or cleared by the [place] FDA and has been authorized for detection and/or diagnosis of SARS-CoV-2 by FDA under an Emergency Use Authorization (EUA). This EUA will remain in effect (meaning this test can be used) for the duration of the COVID-19 declaration under Section 564(b)(1) of the Act, 21 U.S.C. section 360bbb-3(b)(1), unless the authorization is terminated or revoked.     Resp Syncytial Virus by PCR NEGATIVE NEGATIVE Final    Comment: (NOTE) Fact Sheet for Patients: EntrepreneurPulse.com.au  Fact Sheet for Healthcare Providers: IncredibleEmployment.be  This test is not yet approved or cleared by the [place] FDA and has been authorized for detection and/or diagnosis of SARS-CoV-2 by FDA under an Emergency Use Authorization (EUA). This EUA will remain in effect (meaning this test can be  used) for the duration of the COVID-19 declaration under Section 564(b)(1) of the Act, 21 U.S.C. section 360bbb-3(b)(1), unless the authorization is terminated or revoked.  Performed at [facility], [address] [place], [place] [address]   Blood culture (routine x 2)     Status: None   Collection Time: 09/27/22  5:12 PM   Specimen: BLOOD  Result Value Ref Range Status   Specimen Description BLOOD LEFT ANTECUBITAL  Final   Special Requests   Final    BOTTLES DRAWN AEROBIC AND ANAEROBIC Blood Culture results may not be optimal due to an inadequate volume of blood received in culture bottles   Culture   Final    NO GROWTH 5 DAYS Performed at [facility], [address] [place],  [address]    Report Status 10/02/2022 FINAL  Final    Coagulation Studies: Recent Labs    10/02/22 0419 10/03/22 0529 10/04/22 0402  LABPROT 31.8* 27.0* 26.0*  INR 3.1* 2.5* 2.4*     Urinalysis: No results for input(s): "COLORURINE", "  LABSPEC", "PHURINE", "GLUCOSEU", "HGBUR", "BILIRUBINUR", "KETONESUR", "PROTEINUR", "UROBILINOGEN", "NITRITE", "LEUKOCYTESUR" in the last 72 hours.  Invalid input(s): "APPERANCEUR"    Imaging: No results found.   Medications:      allopurinol  100 mg Oral Daily   bromocriptine  2.5 mg Oral QHS   calcium acetate  2,001 mg Oral TID WC   Chlorhexidine Gluconate Cloth  6 each Topical Q0600   cinacalcet  30 mg Oral Q breakfast   doxycycline  100 mg Oral Q12H   insulin aspart  0-15 Units Subcutaneous TID WC   insulin aspart  0-5 Units Subcutaneous QHS   insulin glargine-yfgn  20 Units Subcutaneous QHS   multivitamin  1 tablet Oral Once per day on Mon Wed Fri   polyethylene glycol  17 g Oral Daily   sodium chloride flush  3 mL Intravenous Q12H   warfarin  10 mg Oral ONCE-1600   Warfarin - Pharmacist Dosing Inpatient   Does not apply q1600   acetaminophen **OR** acetaminophen, amLODipine, atenolol, bisacodyl,  lidocaine-prilocaine, melatonin, oxyCODONE, polyvinyl alcohol, senna-docusate  Assessment/ Plan:  52 y.o. male with end-stage renal disease on hemodialysis, obstructive sleep apnea, insulin-dependent diabetes, hypertension, hyperlipidemia, morbid obesity, left lower extremity lymphedema, chronic venous insufficiency was admitted on 09/27/2022 for  Principal Problem:   Cellulitis of left lower extremity Active Problems:   Diabetes mellitus with hyperglycemia (HCC)   Chronic venous insufficiency   Venous ulcer of ankle, left (HCC)   CKD (chronic kidney disease) stage 5, GFR less than 15 ml/min (HCC)   Morbid obesity with BMI of 45.0-49.9, adult (HCC)   End-stage renal disease (HCC)   Diabetic ulcer of lower leg ([place])   COVID-19 virus infection  Cellulitis [L03.90] Cellulitis of left lower extremity [L03.116] Pneumonia due to COVID-19 virus [U07.1, J12.82]  #. ESRD [facility] dialysis/TTS / EDW 180 kg Next treatment scheduled for Tuesday.    #. Anemia of CKD  Lab Results  Component Value Date   HGB 11.9 (L) 10/04/2022  Hemoglobin within optimal range.  No need for ESA's at this time.  #. Secondary hyperparathyroidism of renal origin N 25.81   No results found for: "PTH" Lab Results  Component Value Date   PHOS 8.4 (H) 10/02/2022  Will obtain labs with dialysis in am  Continue calcium acetate and Cinacalcet.   #. Diabetes type 2 with CKD Hgb A1c MFr Bld (%)  Date Value  09/27/2022 8.8 (H)    Sliding scale insulin managed by primary team.  #Left leg cellulitis, with underlying lymphedema. Continue vancomycin with dialysis and Rocephin, per primary team.  Due to presence of multiple blisters with profuse bleeding, vascular surgery consulted and considering angiogram.   Covid -19 Supportive care, patient reports symptoms improving.   LOS: [place] 2/5/20242:53 PM  [place], [place]

## 2022-10-04 NOTE — Progress Notes (Signed)
  Progress Note    10/04/2022 10:11 AM * No surgery found *  Subjective:  [name] is a 52 y.o. (Oct 06, 1970) male who presents with cc: cellulitis is setting of chronic LEFT VSU.  Pt was seen by Dr. [name] in the office on 09/27/22 who felt pt's LLE ulcer had progressed and pt now had cellulitis.  Pt went to the ED for admission.  Vascular Surgery was consulted. Pt notes drainage from blisters in LLE.  Pt denies any fever or chills.    Vitals:   10/04/22 0433 10/04/22 0812  BP: (!) 98/54 118/76  Pulse: 62 82  Resp: 18 18  Temp: 98.4 F (36.9 C) 98.1 F (36.7 C)  SpO2: 97% 95%   Physical Exam: Cardiac:  RRR Lungs:  Non Labored and diminished in the bases Incisions:  N/A Extremities: Trace edema to the right leg.  Left lower extremity has edema up to the mid thigh.  There is erythema with induration of the skin of the lower two thirds of the left leg as well as the entirety of the foot.  There is an ulceration on the lateral part of the upper left leg measuring 2 cm x 2 cm round deep to the subepithelial tissue.  No drainage noted today.  Rest the leg is warm and tender at rest. Abdomen: Positive bowel sounds, soft, nondistended nontender. Neurologic: AAOX3 Normal Affect.   CBC    Component Value Date/Time   WBC 19.6 (H) 10/04/2022 0402   RBC 3.79 (L) 10/04/2022 0402   HGB 11.9 (L) 10/04/2022 0402   HCT 34.9 (L) 10/04/2022 0402   PLT 311 10/04/2022 0402   MCV 92.1 10/04/2022 0402   MCH 31.4 10/04/2022 0402   MCHC 34.1 10/04/2022 0402   RDW 13.6 10/04/2022 0402   LYMPHSABS 1.5 10/04/2022 0402   MONOABS 1.7 (H) 10/04/2022 0402   EOSABS 0.1 10/04/2022 0402   BASOSABS 0.1 10/04/2022 0402    BMET    Component Value Date/Time   NA 135 10/04/2022 0402   K 5.0 10/04/2022 0402   CL 96 (L) 10/04/2022 0402   CO2 24 10/04/2022 0402   GLUCOSE 151 (H) 10/04/2022 0402   BUN 126 (H) 10/04/2022 0402   CREATININE 14.30 (H) 10/04/2022 0402   CALCIUM 8.0 (L) 10/04/2022 0402    GFRNONAA 4 (L) 10/04/2022 0402   GFRAA 9 (L) 02/19/2020 1316    INR    Component Value Date/Time   INR 2.4 (H) 10/04/2022 0402     Intake/Output Summary (Last 24 hours) at 10/04/2022 1011 Last data filed at 10/03/2022 2300 Gross per 24 hour  Intake 100 ml  Output --  Net 100 ml     Assessment/Plan:  52 y.o. male is admitted for Cellulitis of left lower extremity. Was seen in the office on 09/27/22 and sent to the Emergency department to be admitted for antibiotic therapy.   * No surgery found *   PLAN:  Completed IV Ceftriaxone and vancomycin yesterday. Plan per primary team is to discharge home today on doxycycline.  He will need to follow up with Vascular surgery in 1 week for continued evaluation and wound care.   DVT prophylaxis:  Coumadin.    Drema Pry [facility] 10/04/2022 10:11 AM

## 2022-10-04 NOTE — Consult Note (Signed)
ANTICOAGULATION CONSULT NOTE - Follow Up Consult  Pharmacy Consult for Warfarin Indication: DVT  Allergies  Allergen Reactions   Lisinopril Swelling    Lips mouth    Furosemide Cough    Light headed, blurry vision, weakness.   Latex    Adhesive [Tape] Itching and Rash    Patient Measurements: Height: '6\' 4"'$  (193 cm) Weight: (!) 179.7 kg (396 lb 2.7 oz) IBW/kg (Calculated) : 86.8   Vital Signs: Temp: 98.1 F (36.7 C) (02/05 0812) Temp Source: Oral (02/05 0812) BP: 118/76 (02/05 0812) Pulse Rate: 82 (02/05 0812)  Labs: Recent Labs    10/02/22 0418 10/02/22 0419 10/03/22 0529 10/04/22 0402  HGB 12.4*  --  12.3* 11.9*  HCT 37.5*  --  36.7* 34.9*  PLT 369  --  333 311  LABPROT  --  31.8* 27.0* 26.0*  INR  --  3.1* 2.5* 2.4*  CREATININE 14.29*  --  12.84* 14.30*     Estimated Creatinine Clearance: 10.6 mL/min (A) (by C-G formula based on SCr of 14.3 mg/dL (H)).   Medications:  Facility-Administered Medications Prior to Admission  Medication Dose Route Frequency Provider Last Rate Last Admin   [DISCONTINUED] 0.9 %  sodium chloride infusion   Intravenous PRN Causey, Charlestine Massed, NP       Medications Prior to Admission  Medication Sig Dispense Refill Last Dose   allopurinol (ZYLOPRIM) 100 MG tablet Take 100 mg by mouth daily.   09/26/2022   B Complex-C-Folic Acid (RENA-VITE RX) 1 MG TABS Take 1 tablet by mouth 3 (three) times a week.    Past Week   bromocriptine (PARLODEL) 2.5 MG tablet TAKE 1 TABLET BY MOUTH AT BEDTIME. 90 tablet 3 Past Month   calcium acetate (PHOSLO) 667 MG capsule Take 1,334-2,001 mg by mouth See admin instructions. Take 2001 mg by mouth with each meal & take 1334 mg by mouth with each snack.   09/26/2022   cinacalcet (SENSIPAR) 30 MG tablet Take 30 mg by mouth daily.   09/26/2022   CINNAMON PO Take 500 mg by mouth 2 (two) times daily.    09/26/2022   Insulin Glargine (LANTUS SOLOSTAR) 100 UNIT/ML Solostar Pen Inject 15 Units into the skin at  bedtime.    09/26/2022   Multiple Vitamin (MULTIVITAMIN WITH MINERALS) TABS tablet Take 1 tablet by mouth 3 (three) times a week. Men's Multivitamin   Past Week   Multiple Vitamins-Minerals (EYE HEALTH PO) Take 1 capsule by mouth daily.    09/26/2022   warfarin (COUMADIN) 5 MG tablet Take 10 mg by mouth daily.    09/26/2022   amLODipine (NORVASC) 10 MG tablet Take 5 mg by mouth daily as needed (if bp is 190/90 or higher).    prn at prn   atenolol (TENORMIN) 50 MG tablet Take 25 mg by mouth daily as needed (if bp is 190/90 or higher).    prn at prn   ibuprofen (ADVIL) 200 MG tablet Take 200 mg by mouth every 6 (six) hours as needed for headache or moderate pain.   prn at prn   lidocaine-prilocaine (EMLA) cream Apply 1 application topically as needed (port access).   prn at prn   loratadine (CLARITIN) 10 MG tablet Take 10 mg by mouth daily as needed for allergies.   prn at prn   melatonin 5 MG TABS Take 5 mg by mouth at bedtime as needed (sleep).    prn at prn   Polyethyl Glycol-Propyl Glycol (SYSTANE OP) Place 1 drop  into both eyes daily as needed (dry eyes).      sildenafil (VIAGRA) 100 MG tablet Take 1 tablet (100 mg total) by mouth daily as needed for erectile dysfunction. 30 tablet 3 prn at prn   traMADol (ULTRAM) 50 MG tablet Take 1 tablet (50 mg total) by mouth at bedtime as needed for moderate pain or severe pain. 25 tablet 0 prn at prn    Assessment: [name] is a 52 YO M with a PMH of ESRD on dialysis, DM, HLN, and lymphedema of the left LE with prior remote leg cellulitis. Was diagnosed to have progressive cellulitis, DVT, necrotizing infection, osteomyelitis and positive for COVID. [name] was on warfarin previously for blood clots during dialysis treatment. Pharmacy has been consulted for warfarin therapy.   1/29: '10mg'$  PO x 1  1/30: HOLD 1/31: INR: 4.1.HOLD  2/01: INR: 4.1 HOLD  2/02: INR: 3.3, 2.'5mg'$   2/03: INR: 3.1, 2.'5mg'$  2/04: INR: 2.5, '10mg'$  2/05: INR: 2.4, '10mg'$   Goal of Therapy:  INR  2-3 Monitor platelets by anticoagulation protocol: Yes   Plan:  - Will give '10mg'$  warfarin x1 today(home dose).  - Check INR with am labs.   Lorin Picket, PharmD Clinical Pharmacist 10/04/2022 9:38 AM

## 2022-10-04 NOTE — Progress Notes (Signed)
Occupational Therapy Treatment Patient Details Name: [name] MRN: [id number] DOB: [birth year] Today's Date: 10/04/2022   History of present illness [name] is a 52 y.o. male past medical history of end-stage renal disease on dialysis, diabetes hyperlipidemia peripheral vascular disease lymphedema who presents with concern for cellulitis.  Patient tells me that for about a week he has had left lower extremity redness and swelling and pain.  Has had an ulcer on that leg for some time but seem to be improving prior to the onset of this redness.  Tells me left leg is usually more swollen than the right but it is much more swollen today.  He is also had cough body aches decreased appetite and at dialysis had positive COVID test on Saturday.  Last dialysis was Saturday was full session.  Denies increasing dyspnea from baseline.   OT comments  Upon entering the room, pt supine in bed and reports not having a good day. Pt needing encouragement to participate but was agreeable. Pt performs supine >sit with increased time but no physical assistance. Pt sits on EOB with reported increase to 10/10 pain in L LE once upright. Pt declines attempts to stand or transfer. He washes face with set up A while seated. Lateral scoots performed to the L along EOB to get up further in bed with cuing for technique. Pt returning to bed secondary to increased pain and declines further therapeutic intervention at this time. Pt reports plan for possible angiogram today.    Recommendations for follow up therapy are one component of a multi-disciplinary discharge planning process, led by the attending physician.  Recommendations may be updated based on patient status, additional functional criteria and insurance authorization.    Follow Up Recommendations  Home health OT     Assistance Recommended at Discharge Intermittent Supervision/Assistance  Patient can return home with the following  A little help with walking  and/or transfers;A little help with bathing/dressing/bathroom;Help with stairs or ramp for entrance;Assistance with cooking/housework;Assist for transportation   Equipment Recommendations  Other (comment) (bariatric RW)       Precautions / Restrictions Precautions Precautions: Fall       Mobility Bed Mobility Overal bed mobility: Modified Independent             General bed mobility comments: increased time    Transfers Overall transfer level: Needs assistance   Transfers: Bed to chair/wheelchair/BSC            Lateral/Scoot Transfers: Supervision       Balance Overall balance assessment: Needs assistance Sitting-balance support: Bilateral upper extremity supported, Feet supported Sitting balance-Leahy Scale: Good                                     ADL either performed or assessed with clinical judgement   ADL Overall ADL's : Needs assistance/impaired     Grooming: Wash/dry hands;Wash/dry face;Sitting;Set up;Supervision/safety                                      Extremity/Trunk Assessment Upper Extremity Assessment Upper Extremity Assessment: Overall WFL for tasks assessed            Vision Patient Visual Report: No change from baseline            Cognition Arousal/Alertness: Awake/alert Behavior During Therapy: [facility] for  tasks assessed/performed Overall Cognitive Status: Within Functional Limits for tasks assessed                                                     Pertinent Vitals/ Pain       Pain Assessment Pain Assessment: 0-10 Pain Score: 10-Worst pain ever Pain Location: L lower leg and foot Pain Descriptors / Indicators: Grimacing, Discomfort, Moaning, Tender Pain Intervention(s): Limited activity within patient's tolerance, Monitored during session, Premedicated before session, Repositioned         Frequency  Min 2X/week        Progress Toward Goals  OT Goals(current  goals can now be found in the care plan section)  Progress towards OT goals: Progressing toward goals  Acute Rehab OT Goals Patient Stated Goal: to decrease pain and go home OT Goal Formulation: With patient Time For Goal Achievement: 10/13/22 Potential to Achieve Goals: Good  Plan Discharge plan remains appropriate;Frequency remains appropriate       AM-PAC OT "6 Clicks" Daily Activity     Outcome Measure   Help from another person eating meals?: None Help from another person taking care of personal grooming?: None Help from another person toileting, which includes using toliet, bedpan, or urinal?: A Little Help from another person bathing (including washing, rinsing, drying)?: A Little Help from another person to put on and taking off regular upper body clothing?: None Help from another person to put on and taking off regular lower body clothing?: A Little 6 Click Score: 21    End of Session    OT Visit Diagnosis: Unsteadiness on feet (R26.81);Muscle weakness (generalized) (M62.81)   Activity Tolerance Patient limited by pain   Patient Left in bed;with call bell/phone within reach;with bed alarm set   Nurse Communication Mobility status        Time: 1443-1540 OT Time Calculation (min): 17 min  Charges: OT General Charges $OT Visit: 1 Visit OT Treatments $Therapeutic Activity: 8-22 mins  Darleen Crocker, MS, OTR/L , CBIS ascom [phone]  10/04/22, 1:06 PM

## 2022-10-04 NOTE — Progress Notes (Signed)
Progress Note    [name]  QPR:[id number] DOB: 14-Feb-1971  DOA: 09/27/2022 PCP: [name], MD      Brief Narrative:    Medical records reviewed and are as summarized below:  [name] is a 52 y.o. male with medical history significant for left lower extremity lymphedema, chronic venous insufficiency, remote history of lower extremity cellulitis, PVD, ESRD on hemodialysis, blood clot in dialysis graft on Coumadin, sleep apnea not on CPAP, insulin-dependent diabetes mellitus, hypertension, hypercholesterolemia, chronic anemia, morbid obesity.  He has a left ankle ulcer and he saw Dr. Delana Meyer, vascular surgeon, in the office on 08/19/2022 for this.  Local wound care and tramadol were prescribed at that time.  He noticed increasing pain, redness and swelling in the left leg and foot about a week prior to admission. He went to see Dr. Delana Meyer in the office on 09/26/2022.  He presented to the emergency room for further management.  He also complained of cough, body aches and decreased appetite.  He tested positive for COVID-19 infection on Saturday, 09/25/2022.  He was admitted to the hospital for cellulitis of the left lower extremity.     Assessment/Plan:   Principal Problem:   Cellulitis of left lower extremity Active Problems:   Diabetes mellitus with hyperglycemia (HCC)   Chronic venous insufficiency   Venous ulcer of ankle, left (HCC)   CKD (chronic kidney disease) stage 5, GFR less than 15 ml/min (HCC)   Morbid obesity with BMI of 45.0-49.9, adult (HCC)   End-stage renal disease ([place])   Diabetic ulcer of lower leg ([place])   COVID-19 virus infection   Body mass index is 48.22 kg/m.  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  Completed 7-day course of IV antibiotics (ceftriaxone and  vancomycin).  Start oral doxycycline.  Appreciate input from vascular surgeon.  No plan for angiogram or any procedures on this visit.  Outpatient follow-up with Dr. Delana Meyer, vascular surgeon, in the office   COVID-19 infection: IV dexamethasone was discontinued on 10/01/2018 after 4 doses..  Completed 3 doses of IV remdesivir on 09/29/2022.    Acute hypoxic respiratory failure: Resolved.  He is tolerating room air.     Insulin-dependent diabetes mellitus with hyperglycemia: Continue insulin glargine and NovoLog as needed.    ESRD, hyperkalemia: Plan for hemodialysis tomorrow  PVD, history of DVT, history of clot in AV graft: INR therapeutic at 2.4.  Continue warfarin.  Follow-up with pharmacist for warfarin dose adjustment.  Elevated liver enzymes: Improved   General weakness: PT and OT recommended home health therapy   Diet Order             Diet Carb Modified Fluid consistency: Thin; Room service appropriate? Yes  Diet effective now                            Consultants: Nephrologist Vascular surgeon  Procedures: None    Medications:    allopurinol  100 mg Oral Daily   bromocriptine  2.5 mg Oral QHS   calcium acetate  2,001 mg Oral TID WC   Chlorhexidine Gluconate Cloth  6 each Topical Q0600   cinacalcet  30 mg Oral Q breakfast   doxycycline  100 mg Oral Q12H   insulin aspart  0-15 Units Subcutaneous TID WC   insulin  aspart  0-5 Units Subcutaneous QHS   insulin glargine-yfgn  20 Units Subcutaneous QHS   multivitamin  1 tablet Oral Once per day on Mon Wed Fri   polyethylene glycol  17 g Oral Daily   sodium chloride flush  3 mL Intravenous Q12H   warfarin  10 mg Oral ONCE-1600   Warfarin - Pharmacist Dosing Inpatient   Does not apply q1600   Continuous Infusions:     Anti-infectives (From admission, onward)    Start     Dose/Rate Route Frequency Ordered Stop   10/04/22 1415  doxycycline (VIBRA-TABS) tablet 100 mg  Status:  Discontinued         100 mg Oral Every 12 hours 10/04/22 1327 10/04/22 1327   10/04/22 1415  doxycycline (VIBRA-TABS) tablet 100 mg        100 mg Oral Every 12 hours 10/04/22 1327 10/07/22 0959   09/28/22 1630  cefTRIAXone (ROCEPHIN) 1 g in sodium chloride 0.9 % 100 mL IVPB        1 g 200 mL/hr over 30 Minutes Intravenous Every 24 hours 09/28/22 1532 10/03/22 1608   09/28/22 1200  vancomycin (VANCOCIN) IVPB 1000 mg/200 mL premix        1,000 mg 200 mL/hr over 60 Minutes Intravenous Every T-Th-Sa (Hemodialysis) 09/28/22 0833 10/02/22 1922   09/28/22 1000  remdesivir 100 mg in sodium chloride 0.9 % 100 mL IVPB       See Hyperspace for full Linked Orders Report.   100 mg 200 mL/hr over 30 Minutes Intravenous Daily 09/27/22 2028 09/29/22 0925   09/27/22 2030  remdesivir 200 mg in sodium chloride 0.9% 250 mL IVPB       See Hyperspace for full Linked Orders Report.   200 mg 580 mL/hr over 30 Minutes Intravenous Once 09/27/22 2028 09/27/22 2335   09/27/22 2015  vancomycin (VANCOREADY) IVPB 500 mg/100 mL        500 mg 100 mL/hr over 60 Minutes Intravenous  Once 09/27/22 2008 09/28/22 0101   09/27/22 2007  vancomycin variable dose per unstable renal function (pharmacist dosing)  Status:  Discontinued         Does not apply See admin instructions 09/27/22 2008 09/28/22 0833   09/27/22 1730  vancomycin (VANCOREADY) IVPB 2000 mg/400 mL        2,000 mg 200 mL/hr over 120 Minutes Intravenous  Once 09/27/22 1707 09/27/22 2313   09/27/22 1715  cefTRIAXone (ROCEPHIN) 2 g in sodium chloride 0.9 % 100 mL IVPB        2 g 200 mL/hr over 30 Minutes Intravenous  Once 09/27/22 1707 09/27/22 1933              Family Communication/Anticipated D/C date and plan/Code Status   DVT prophylaxis:  warfarin (COUMADIN) tablet 10 mg     Code Status: Full Code  Family Communication: None Disposition Plan: Plan to discharge home tomorrow   Status is: Inpatient Remains inpatient appropriate because: Worsening  leukocytosis, plan for hemodialysis tomorrow prior to discharge       Subjective:   No new complaints.  He still has some discomfort in the left leg.  Objective:    Vitals:   10/04/22 0102 10/04/22 0433 10/04/22 0812 10/04/22 1507  BP: 125/64 (!) 98/54 118/76 117/83  Pulse: (!) 109 62 82 (!) 108  Resp: '19 18 18 20  '$ Temp: 98.9 F (37.2 C) 98.4 F (36.9 C) 98.1 F (36.7 C) 98.1 F (36.7 C)  TempSrc: Oral Oral  Oral   SpO2: 92% 97% 95% 97%  Weight:      Height:       No data found.   Intake/Output Summary (Last 24 hours) at 10/04/2022 1508 Last data filed at 10/03/2022 2300 Gross per 24 hour  Intake 100 ml  Output --  Net 100 ml   Filed Weights   09/30/22 1439 09/30/22 1753 10/02/22 1620  Weight: (!) 177.4 kg (!) 175.3 kg (!) 179.7 kg    Exam:  GEN: NAD SKIN: Warm and dry EYES: EOMI ENT: MMM CV: RRR PULM: CTA B ABD: soft, obese, NT, +BS CNS: AAO x 3, non focal EXT: Lymphedema of the left leg.  Left leg and foot swollen with erythema and tenderness.  Some blood noted from ruptured blisters on the left leg           Data Reviewed:   I have personally reviewed following labs and imaging studies:  Labs: Labs show the following:   Basic Metabolic Panel: Recent Labs  Lab 09/30/22 2210 10/01/22 0351 10/02/22 0418 10/03/22 0529 10/04/22 0402  NA 133* 134* 132* 135 135  K 4.9 5.4* 5.8* 4.6 5.0  CL 93* 93* 93* 96* 96*  CO2 '24 25 24 25 24  '$ GLUCOSE 242* 249* 254* 158* 151*  BUN 93* 104* 125* 111* 126*  CREATININE 11.94* 12.67* 14.29* 12.84* 14.30*  CALCIUM 8.8* 8.7* 8.5* 8.3* 8.0*  PHOS 6.5*  --  8.4*  --   --    GFR Estimated Creatinine Clearance: 10.6 mL/min (A) (by C-G formula based on SCr of 14.3 mg/dL (H)). Liver Function Tests: Recent Labs  Lab 09/29/22 0608 09/30/22 2210 10/02/22 0418  AST 26  --   --   ALT 33  --   --   ALKPHOS 107  --   --   BILITOT 1.0  --   --   PROT 7.5  --   --   ALBUMIN 2.4* 2.5* 2.4*   No results for  input(s): "LIPASE", "AMYLASE" in the last 168 hours. No results for input(s): "AMMONIA" in the last 168 hours. Coagulation profile Recent Labs  Lab 09/30/22 0654 10/01/22 0351 10/02/22 0419 10/03/22 0529 10/04/22 0402  INR 4.1* 3.3* 3.1* 2.5* 2.4*    CBC: Recent Labs  Lab 09/29/22 0608 09/30/22 0654 09/30/22 1726 10/01/22 0351 10/02/22 0418 10/03/22 0529 10/04/22 0402  WBC 25.0* 20.8* 24.8* 19.6* 20.2* 15.5* 19.6*  NEUTROABS 23.6* 19.5*  --  18.2*  --  11.2* 15.1*  HGB 11.8* 12.2* 11.6* 11.1* 12.4* 12.3* 11.9*  HCT 35.8* 36.1* 32.9* 32.6* 37.5* 36.7* 34.9*  MCV 95.2 93.3 89.9 91.6 93.5 92.2 92.1  PLT 275 314 340 334 369 333 311   Cardiac Enzymes: No results for input(s): "CKTOTAL", "CKMB", "CKMBINDEX", "TROPONINI" in the last 168 hours. BNP (last 3 results) No results for input(s): "PROBNP" in the last 8760 hours. CBG: Recent Labs  Lab 10/03/22 1221 10/03/22 1755 10/03/22 2052 10/04/22 0807 10/04/22 1133  GLUCAP 118* 184* 179* 124* 97   D-Dimer: No results for input(s): "DDIMER" in the last 72 hours. Hgb A1c: No results for input(s): "HGBA1C" in the last 72 hours.  Lipid Profile: No results for input(s): "CHOL", "HDL", "LDLCALC", "TRIG", "CHOLHDL", "LDLDIRECT" in the last 72 hours. Thyroid function studies: No results for input(s): "TSH", "T4TOTAL", "T3FREE", "THYROIDAB" in the last 72 hours.  Invalid input(s): "FREET3" Anemia work up: No results for input(s): "VITAMINB12", "FOLATE", "FERRITIN", "TIBC", "IRON", "RETICCTPCT" in the last 72 hours. Sepsis Labs: Recent  Labs  Lab 09/27/22 1707 09/28/22 0448 10/01/22 0351 10/02/22 0418 10/03/22 0529 10/04/22 0402  WBC  --    < > 19.6* 20.2* 15.5* 19.6*  LATICACIDVEN 1.5  --   --   --   --   --    < > = values in this interval not displayed.    Microbiology Recent Results (from the past 240 hour(s))  Blood culture (routine x 2)     Status: None   Collection Time: 09/27/22  5:07 PM   Specimen: BLOOD   Result Value Ref Range Status   Specimen Description BLOOD RIGHT ANTECUBITAL  Final   Special Requests   Final    BOTTLES DRAWN AEROBIC AND ANAEROBIC Blood Culture results may not be optimal due to an inadequate volume of blood received in culture bottles   Culture   Final    NO GROWTH 5 DAYS Performed at [facility], [address] [place], [place] [address]    Report Status 10/02/2022 FINAL  Final  Resp panel by RT-PCR (RSV, Flu A&B, Covid) Anterior Nasal Swab     Status: Abnormal   Collection Time: 09/27/22  5:07 PM   Specimen: Anterior Nasal Swab  Result Value Ref Range Status   SARS Coronavirus 2 by RT PCR POSITIVE (A) NEGATIVE Final    Comment: (NOTE) SARS-CoV-2 target nucleic acids are DETECTED.  The SARS-CoV-2 RNA is generally detectable in upper respiratory specimens during the acute phase of infection. Positive results are indicative of the presence of the identified virus, but do not rule out bacterial infection or co-infection with other pathogens not detected by the test. Clinical correlation with patient history and other diagnostic information is necessary to determine patient infection status. The expected result is Negative.  Fact Sheet for Patients: EntrepreneurPulse.com.au  Fact Sheet for Healthcare Providers: IncredibleEmployment.be  This test is not yet approved or cleared by the [place] FDA and  has been authorized for detection and/or diagnosis of SARS-CoV-2 by FDA under an Emergency Use Authorization (EUA).  This EUA will remain in effect (meaning this test can be used) for the duration of  the COVID-19 declaration under Section 564(b)(1) of the A ct, 21 U.S.C. section 360bbb-3(b)(1), unless the authorization is terminated or revoked sooner.     Influenza A by PCR NEGATIVE NEGATIVE Final   Influenza B by PCR NEGATIVE NEGATIVE Final    Comment: (NOTE) The Xpert Xpress SARS-CoV-2/FLU/RSV plus assay  is intended as an aid in the diagnosis of influenza from Nasopharyngeal swab specimens and should not be used as a sole basis for treatment. Nasal washings and aspirates are unacceptable for Xpert Xpress SARS-CoV-2/FLU/RSV testing.  Fact Sheet for Patients: EntrepreneurPulse.com.au  Fact Sheet for Healthcare Providers: IncredibleEmployment.be  This test is not yet approved or cleared by the [place] FDA and has been authorized for detection and/or diagnosis of SARS-CoV-2 by FDA under an Emergency Use Authorization (EUA). This EUA will remain in effect (meaning this test can be used) for the duration of the COVID-19 declaration under Section 564(b)(1) of the Act, 21 U.S.C. section 360bbb-3(b)(1), unless the authorization is terminated or revoked.     Resp Syncytial Virus by PCR NEGATIVE NEGATIVE Final    Comment: (NOTE) Fact Sheet for Patients: EntrepreneurPulse.com.au  Fact Sheet for Healthcare Providers: IncredibleEmployment.be  This test is not yet approved or cleared by the [place] FDA and has been authorized for detection and/or diagnosis of SARS-CoV-2 by FDA under an Emergency Use Authorization (EUA). This EUA  will remain in effect (meaning this test can be used) for the duration of the COVID-19 declaration under Section 564(b)(1) of the Act, 21 U.S.C. section 360bbb-3(b)(1), unless the authorization is terminated or revoked.  Performed at [facility], [address] [place], [place] [address]   Blood culture (routine x 2)     Status: None   Collection Time: 09/27/22  5:12 PM   Specimen: BLOOD  Result Value Ref Range Status   Specimen Description BLOOD LEFT ANTECUBITAL  Final   Special Requests   Final    BOTTLES DRAWN AEROBIC AND ANAEROBIC Blood Culture results may not be optimal due to an inadequate volume of blood received in culture bottles   Culture   Final    NO  GROWTH 5 DAYS Performed at [facility], [address], [place], [place] [address]    Report Status 10/02/2022 FINAL  Final    Procedures and diagnostic studies:  No results found.             LOS: 7 days   Amoreena Neubert  [facility]   Pager on [email or web address]. If 7PM-7AM, please contact night-coverage at [email or web address]     10/04/2022, 3:08 PM

## 2022-10-04 NOTE — Plan of Care (Signed)
  Problem: Coping: Goal: Ability to adjust to condition or change in health will improve Outcome: Progressing   

## 2022-10-04 NOTE — Progress Notes (Signed)
PT Cancellation Note  Patient Details Name: MUATH HALLAM MRN: [id number] DOB: [birth year]   Cancelled Treatment:    Reason Eval/Treat Not Completed: Pain limiting ability to participate. Per OT pt requesting PT to hold treatment today due to pain. Requesting PT to re-attempt in the morning. PT to re-attempt at a later time/date as able.    Salem Caster. Fairly IV, PT, DPT Physical Therapist- [facility]  10/04/2022, 11:12 AM

## 2022-10-04 NOTE — TOC Progression Note (Signed)
Transition of Care [facility]) - Progression Note    Patient Details  Name: [name] MRN: [id number] Date of Birth: [birth year]  [facility] CM/SW Contact  Beverly Sessions, RN Phone Number: 10/04/2022, 2:55 PM  Clinical Narrative:     Bariatric RW referral made to [facility] with [employer].  Patient has been weaned to [facility]       Expected Discharge Plan and Services                                               Social Determinants of Health (SDOH) Interventions SDOH Screenings   Food Insecurity: No Food Insecurity (09/28/2022)  Housing: Low Risk  (09/28/2022)  Transportation Needs: No Transportation Needs (09/28/2022)  Utilities: Not At Risk (09/28/2022)  Tobacco Use: Medium Risk (10/02/2022)    Readmission Risk Interventions     No data to display

## 2022-10-05 DIAGNOSIS — N186 End stage renal disease: Secondary | ICD-10-CM | POA: Diagnosis not present

## 2022-10-05 DIAGNOSIS — U071 COVID-19: Secondary | ICD-10-CM | POA: Diagnosis not present

## 2022-10-05 DIAGNOSIS — L03116 Cellulitis of left lower limb: Secondary | ICD-10-CM | POA: Diagnosis not present

## 2022-10-05 LAB — BASIC METABOLIC PANEL
Anion gap: 19 — ABNORMAL HIGH (ref 5–15)
BUN: 150 mg/dL — ABNORMAL HIGH (ref 6–20)
CO2: 19 mmol/L — ABNORMAL LOW (ref 22–32)
Calcium: 7.8 mg/dL — ABNORMAL LOW (ref 8.9–10.3)
Chloride: 94 mmol/L — ABNORMAL LOW (ref 98–111)
Creatinine, Ser: 16.07 mg/dL — ABNORMAL HIGH (ref 0.61–1.24)
GFR, Estimated: 3 mL/min — ABNORMAL LOW (ref >60)
Glucose, Bld: 140 mg/dL — ABNORMAL HIGH (ref 70–99)
Potassium: 5.3 mmol/L — ABNORMAL HIGH (ref 3.5–5.1)
Sodium: 132 mmol/L — ABNORMAL LOW (ref 135–145)

## 2022-10-05 LAB — GLUCOSE, CAPILLARY: Glucose-Capillary: 135 mg/dL — ABNORMAL HIGH (ref 70–99)

## 2022-10-05 LAB — CBC WITH DIFFERENTIAL/PLATELET
Abs Immature Granulocytes: 1.08 10*3/uL — ABNORMAL HIGH (ref 0.00–0.07)
Basophils Absolute: 0.1 10*3/uL (ref 0.0–0.1)
Basophils Relative: 1 %
Eosinophils Absolute: 0.2 10*3/uL (ref 0.0–0.5)
Eosinophils Relative: 2 %
HCT: 34.8 % — ABNORMAL LOW (ref 39.0–52.0)
Hemoglobin: 11.8 g/dL — ABNORMAL LOW (ref 13.0–17.0)
Immature Granulocytes: 8 %
Lymphocytes Relative: 11 %
Lymphs Abs: 1.5 10*3/uL (ref 0.7–4.0)
MCH: 31 pg (ref 26.0–34.0)
MCHC: 33.9 g/dL (ref 30.0–36.0)
MCV: 91.3 fL (ref 80.0–100.0)
Monocytes Absolute: 1.1 10*3/uL — ABNORMAL HIGH (ref 0.1–1.0)
Monocytes Relative: 7 %
Neutro Abs: 10.5 10*3/uL — ABNORMAL HIGH (ref 1.7–7.7)
Neutrophils Relative %: 71 %
Platelets: 318 10*3/uL (ref 150–400)
RBC: 3.81 MIL/uL — ABNORMAL LOW (ref 4.22–5.81)
RDW: 13.5 % (ref 11.5–15.5)
Smear Review: NORMAL
WBC: 14.5 10*3/uL — ABNORMAL HIGH (ref 4.0–10.5)
nRBC: 0 % (ref 0.0–0.2)

## 2022-10-05 LAB — PROTIME-INR
INR: 2.6 — ABNORMAL HIGH (ref 0.8–1.2)
Prothrombin Time: 27.8 seconds — ABNORMAL HIGH (ref 11.4–15.2)

## 2022-10-05 LAB — PHOSPHORUS: Phosphorus: 9.7 mg/dL — ABNORMAL HIGH (ref 2.5–4.6)

## 2022-10-05 MED ORDER — INSULIN GLARGINE-YFGN 100 UNIT/ML ~~LOC~~ SOLN
15.0000 [IU] | Freq: Every day | SUBCUTANEOUS | Status: DC
  Filled 2022-10-05: qty 0.15

## 2022-10-05 MED ORDER — DOXYCYCLINE HYCLATE 100 MG PO TABS: 100.0000 mg | ORAL_TABLET | Freq: Two times a day (BID) | ORAL | 0 refills | Status: AC

## 2022-10-05 MED ORDER — WARFARIN SODIUM 7.5 MG PO TABS
7.5000 mg | ORAL_TABLET | Freq: Once | ORAL | Status: AC
  Administered 2022-10-05: 7.5 mg via ORAL
  Filled 2022-10-05: qty 1

## 2022-10-05 MED ORDER — PENTAFLUOROPROP-TETRAFLUOROETH EX AERO
INHALATION_SPRAY | CUTANEOUS | Status: AC
  Filled 2022-10-05: qty 30

## 2022-10-05 MED ORDER — ACETAMINOPHEN 325 MG PO TABS: 650.0000 mg | ORAL_TABLET | Freq: Four times a day (QID) | ORAL | Status: AC | PRN

## 2022-10-05 MED ORDER — WARFARIN SODIUM 5 MG PO TABS: 7.5000 mg | ORAL_TABLET | Freq: Every day | ORAL | Status: DC

## 2022-10-05 NOTE — Progress Notes (Signed)
1306: BP 69/52; UF paused and 100 ml NS bolus given. Repeat blood pressure is 89/49 and patient is asymptomatic. NP notified and ordered to keep UF off. Monitored patient closely.

## 2022-10-05 NOTE — Discharge Instructions (Signed)
Transportation Agency Name: [facility] Agency Address: [address] [place], [place] [address] Phone: [phone] Email: [email or web address] Website: [email or web address] Service(s) Offered: [employer], self-sufficiency, congregate meal  program, weatherization program, Administrator, sports program, emergency food assistance,  housing counseling, home ownership program, [place] program. December 23, 2016 22  Agency Name: [facility] [id number]) Address: [address], [place], [place] [address] Phone: [phone] Email:  Website: [email or web address] Service(s) Offered: Transportation for general public, subscription and demand  response; Dial-a-Ride for citizens 58 years of age or older. Agency Name: Department of Social Services Address: [address] [place], [place] [address] Phone: [phone] Service(s) Offered: Child support services; child welfare services; food stamps;  Medicaid; work first family assistance; and aid with fuel,  rent, food and medicine, transportation assistance.  Agency Name: [employer] Transportation  Network Address: Phone: [phone] Service(s) Offered: Transports veterans to the [facility]. Call  forty-eight hours in advance and leave the name, telephone  number, date, and time of appointment. [place] will be  contacted by the driver the day before the appointment to  arrange a pick up point   [place] number - [place] Call to see if they can assist with transport

## 2022-10-05 NOTE — TOC Transition Note (Signed)
Transition of Care [facility]) - CM/SW Discharge Note   Patient Details  Name: BECKAM ABDULAZIZ MRN: [id number] Date of Birth: [birth year]  Transition of Care [facility]) CM/SW Contact:  Beverly Sessions, RN Phone Number: 10/05/2022, 3:12 PM   Clinical Narrative:     Patient to discharge today [place] with [place] notified of discharge Bariatric RW delivered to room Elvera Bicker dialysis liaison notified of discharge  Transportation resources added to AVS Patient states for tomorrows transport to HD he will either drive himself or him mom will take him  Patient states that his mom or fiance will be available to transport at discharge        Patient Goals and CMS Choice      Discharge Placement                         Discharge Plan and Services Additional resources added to the After Visit Summary for                                       Social Determinants of Health (SDOH) Interventions SDOH Screenings   Food Insecurity: No Food Insecurity (09/28/2022)  Housing: Low Risk  (09/28/2022)  Transportation Needs: No Transportation Needs (09/28/2022)  Utilities: Not At Risk (09/28/2022)  Tobacco Use: Medium Risk (10/02/2022)     Readmission Risk Interventions     No data to display

## 2022-10-05 NOTE — Progress Notes (Incomplete)
Progress Note    [name]  UYQ:[id number] DOB: [birth year]  DOA: 09/27/2022 PCP: [name], MD      Brief Narrative:    Medical records reviewed and are as summarized below:  [name] is a 52 y.o. male with medical history significant for left lower extremity lymphedema, chronic venous insufficiency, remote history of lower extremity cellulitis, PVD, ESRD on hemodialysis, blood clot in dialysis graft on Coumadin, sleep apnea not on CPAP, insulin-dependent diabetes mellitus, hypertension, hypercholesterolemia, chronic anemia, morbid obesity.  He has a left ankle ulcer and he saw Dr. Delana Meyer, vascular surgeon, in the office on 08/19/2022 for this.  Local wound care and tramadol were prescribed at that time.  He noticed increasing pain, redness and swelling in the left leg and foot about a week prior to admission. He went to see Dr. Delana Meyer in the office on 09/26/2022.  He presented to the emergency room for further management.  He also complained of cough, body aches and decreased appetite.  He tested positive for COVID-19 infection on Saturday, 09/25/2022.  He was admitted to the hospital for cellulitis of the left lower extremity.     Assessment/Plan:   Principal Problem:   Cellulitis of left lower extremity Active Problems:   Diabetes mellitus with hyperglycemia (HCC)   Chronic venous insufficiency   Venous ulcer of ankle, left (HCC)   CKD (chronic kidney disease) stage 5, GFR less than 15 ml/min (HCC)   Morbid obesity with BMI of 45.0-49.9, adult (HCC)   End-stage renal disease ([place])   Diabetic ulcer of lower leg ([place])   COVID-19 virus infection   Body mass index is 47.5 kg/m.  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  Completed 7-day course of IV antibiotics (ceftriaxone and  vancomycin).  Start oral doxycycline.  Appreciate input from vascular surgeon.  No plan for angiogram or any procedures on this visit.  Outpatient follow-up with Dr. Delana Meyer, vascular surgeon, in the office   COVID-19 infection: IV dexamethasone was discontinued on 10/01/2018 after 4 doses..  Completed 3 doses of IV remdesivir on 09/29/2022.    Acute hypoxic respiratory failure: Resolved.  He is tolerating room air.     Insulin-dependent diabetes mellitus with hyperglycemia: Continue insulin glargine and NovoLog as needed.    ESRD, hyperkalemia: Plan for hemodialysis tomorrow  PVD, history of DVT, history of clot in AV graft: INR therapeutic at 2.4.  Continue warfarin.  Follow-up with pharmacist for warfarin dose adjustment.  Elevated liver enzymes: Improved   General weakness: PT and OT recommended home health therapy   Diet Order             Diet renal/carb modified with fluid restriction           Diet Carb Modified Fluid consistency: Thin; Room service appropriate? Yes  Diet effective now                            Consultants: Nephrologist Vascular surgeon  Procedures: None    Medications:    allopurinol  100 mg Oral Daily   bromocriptine  2.5 mg Oral QHS   calcium acetate  2,001 mg Oral TID WC   Chlorhexidine Gluconate Cloth  6 each Topical Q0600   cinacalcet  30 mg Oral Q breakfast   doxycycline  100  mg Oral Q12H   insulin aspart  0-15 Units Subcutaneous TID WC   insulin aspart  0-5 Units Subcutaneous QHS   insulin glargine-yfgn  15 Units Subcutaneous QHS   multivitamin  1 tablet Oral Once per day on Mon Wed Fri   pentafluoroprop-tetrafluoroeth       polyethylene glycol  17 g Oral Daily   sodium chloride flush  3 mL Intravenous Q12H   warfarin  7.5 mg Oral ONCE-1600   Warfarin - Pharmacist Dosing Inpatient   Does not apply q1600   Continuous Infusions:     Anti-infectives (From admission, onward)    Start     Dose/Rate Route Frequency  Ordered Stop   10/05/22 0000  doxycycline (VIBRA-TABS) 100 MG tablet        100 mg Oral Every 12 hours 10/05/22 1439 10/08/22 2359   10/04/22 1415  doxycycline (VIBRA-TABS) tablet 100 mg  Status:  Discontinued        100 mg Oral Every 12 hours 10/04/22 1327 10/04/22 1327   10/04/22 1415  doxycycline (VIBRA-TABS) tablet 100 mg        100 mg Oral Every 12 hours 10/04/22 1327 10/07/22 0959   09/28/22 1630  cefTRIAXone (ROCEPHIN) 1 g in sodium chloride 0.9 % 100 mL IVPB        1 g 200 mL/hr over 30 Minutes Intravenous Every 24 hours 09/28/22 1532 10/03/22 1608   09/28/22 1200  vancomycin (VANCOCIN) IVPB 1000 mg/200 mL premix        1,000 mg 200 mL/hr over 60 Minutes Intravenous Every T-Th-Sa (Hemodialysis) 09/28/22 0833 10/02/22 1922   09/28/22 1000  remdesivir 100 mg in sodium chloride 0.9 % 100 mL IVPB       See Hyperspace for full Linked Orders Report.   100 mg 200 mL/hr over 30 Minutes Intravenous Daily 09/27/22 2028 09/29/22 0925   09/27/22 2030  remdesivir 200 mg in sodium chloride 0.9% 250 mL IVPB       See Hyperspace for full Linked Orders Report.   200 mg 580 mL/hr over 30 Minutes Intravenous Once 09/27/22 2028 09/27/22 2335   09/27/22 2015  vancomycin (VANCOREADY) IVPB 500 mg/100 mL        500 mg 100 mL/hr over 60 Minutes Intravenous  Once 09/27/22 2008 09/28/22 0101   09/27/22 2007  vancomycin variable dose per unstable renal function (pharmacist dosing)  Status:  Discontinued         Does not apply See admin instructions 09/27/22 2008 09/28/22 0833   09/27/22 1730  vancomycin (VANCOREADY) IVPB 2000 mg/400 mL        2,000 mg 200 mL/hr over 120 Minutes Intravenous  Once 09/27/22 1707 09/27/22 2313   09/27/22 1715  cefTRIAXone (ROCEPHIN) 2 g in sodium chloride 0.9 % 100 mL IVPB        2 g 200 mL/hr over 30 Minutes Intravenous  Once 09/27/22 1707 09/27/22 1933              Family Communication/Anticipated D/C date and plan/Code Status   DVT prophylaxis:  warfarin  (COUMADIN) tablet 7.5 mg     Code Status: Full Code  Family Communication: None Disposition Plan: Plan to discharge home tomorrow   Status is: Inpatient Remains inpatient appropriate because: Worsening leukocytosis, plan for hemodialysis tomorrow prior to discharge       Subjective:   No new complaints.  He still has some discomfort in the left leg.  Objective:    Vitals:   10/05/22  1310 10/05/22 1315 10/05/22 1330 10/05/22 1350  BP: (!) 89/48 103/74 108/62 132/61  Pulse: (!) 114 (!) 109 (!) 108 (!) 110  Resp: '18 16 17 18  '$ Temp:    97.8 F (36.6 C)  TempSrc:    Oral  SpO2: 98% 99% 98% 99%  Weight:    (!) 177 kg  Height:       No data found.   Intake/Output Summary (Last 24 hours) at 10/05/2022 1616 Last data filed at 10/05/2022 1350 Gross per 24 hour  Intake --  Output 1901 ml  Net -1901 ml   Filed Weights   10/02/22 1620 10/05/22 1000 10/05/22 1350  Weight: (!) 179.7 kg (!) 178.2 kg (!) 177 kg    Exam:  GEN: NAD SKIN: Warm and dry EYES: EOMI ENT: MMM CV: RRR PULM: CTA B ABD: soft, obese, NT, +BS CNS: AAO x 3, non focal EXT: Lymphedema of the left leg.  Left leg and foot swollen with erythema and tenderness.  Some blood noted from ruptured blisters on the left leg           Data Reviewed:   I have personally reviewed following labs and imaging studies:  Labs: Labs show the following:   Basic Metabolic Panel: Recent Labs  Lab 09/30/22 2210 10/01/22 0351 10/02/22 0418 10/03/22 0529 10/04/22 0402 10/05/22 1030 10/05/22 1040  NA 133* 134* 132* 135 135  --  132*  K 4.9 5.4* 5.8* 4.6 5.0  --  5.3*  CL 93* 93* 93* 96* 96*  --  94*  CO2 '24 25 24 25 24  '$ --  19*  GLUCOSE 242* 249* 254* 158* 151*  --  140*  BUN 93* 104* 125* 111* 126*  --  150*  CREATININE 11.94* 12.67* 14.29* 12.84* 14.30*  --  16.07*  CALCIUM 8.8* 8.7* 8.5* 8.3* 8.0*  --  7.8*  PHOS 6.5*  --  8.4*  --   --  9.7*  --    GFR Estimated Creatinine Clearance: 9.3 mL/min  (A) (by C-G formula based on SCr of 16.07 mg/dL (H)). Liver Function Tests: Recent Labs  Lab 09/29/22 0608 09/30/22 2210 10/02/22 0418  AST 26  --   --   ALT 33  --   --   ALKPHOS 107  --   --   BILITOT 1.0  --   --   PROT 7.5  --   --   ALBUMIN 2.4* 2.5* 2.4*   No results for input(s): "LIPASE", "AMYLASE" in the last 168 hours. No results for input(s): "AMMONIA" in the last 168 hours. Coagulation profile Recent Labs  Lab 10/01/22 0351 10/02/22 0419 10/03/22 0529 10/04/22 0402 10/05/22 1040  INR 3.3* 3.1* 2.5* 2.4* 2.6*    CBC: Recent Labs  Lab 09/30/22 0654 09/30/22 1726 10/01/22 0351 10/02/22 0418 10/03/22 0529 10/04/22 0402 10/05/22 1040  WBC 20.8*   < > 19.6* 20.2* 15.5* 19.6* 14.5*  NEUTROABS 19.5*  --  18.2*  --  11.2* 15.1* 10.5*  HGB 12.2*   < > 11.1* 12.4* 12.3* 11.9* 11.8*  HCT 36.1*   < > 32.6* 37.5* 36.7* 34.9* 34.8*  MCV 93.3   < > 91.6 93.5 92.2 92.1 91.3  PLT 314   < > 334 369 333 311 318   < > = values in this interval not displayed.   Cardiac Enzymes: No results for input(s): "CKTOTAL", "CKMB", "CKMBINDEX", "TROPONINI" in the last 168 hours. BNP (last 3 results) No results for input(s): "PROBNP"  in the last 8760 hours. CBG: Recent Labs  Lab 10/04/22 0807 10/04/22 1133 10/04/22 1607 10/04/22 1940 10/05/22 0919  GLUCAP 124* 97 135* 191* 135*   D-Dimer: No results for input(s): "DDIMER" in the last 72 hours. Hgb A1c: No results for input(s): "HGBA1C" in the last 72 hours.  Lipid Profile: No results for input(s): "CHOL", "HDL", "LDLCALC", "TRIG", "CHOLHDL", "LDLDIRECT" in the last 72 hours. Thyroid function studies: No results for input(s): "TSH", "T4TOTAL", "T3FREE", "THYROIDAB" in the last 72 hours.  Invalid input(s): "FREET3" Anemia work up: No results for input(s): "VITAMINB12", "FOLATE", "FERRITIN", "TIBC", "IRON", "RETICCTPCT" in the last 72 hours. Sepsis Labs: Recent Labs  Lab 10/02/22 0418 10/03/22 0529 10/04/22 0402  10/05/22 1040  WBC 20.2* 15.5* 19.6* 14.5*    Microbiology Recent Results (from the past 240 hour(s))  Blood culture (routine x 2)     Status: None   Collection Time: 09/27/22  5:07 PM   Specimen: BLOOD  Result Value Ref Range Status   Specimen Description BLOOD RIGHT ANTECUBITAL  Final   Special Requests   Final    BOTTLES DRAWN AEROBIC AND ANAEROBIC Blood Culture results may not be optimal due to an inadequate volume of blood received in culture bottles   Culture   Final    NO GROWTH 5 DAYS Performed at [facility], [address] [place], [place] [address]    Report Status 10/02/2022 FINAL  Final  Resp panel by RT-PCR (RSV, Flu A&B, Covid) Anterior Nasal Swab     Status: Abnormal   Collection Time: 09/27/22  5:07 PM   Specimen: Anterior Nasal Swab  Result Value Ref Range Status   SARS Coronavirus 2 by RT PCR POSITIVE (A) NEGATIVE Final    Comment: (NOTE) SARS-CoV-2 target nucleic acids are DETECTED.  The SARS-CoV-2 RNA is generally detectable in upper respiratory specimens during the acute phase of infection. Positive results are indicative of the presence of the identified virus, but do not rule out bacterial infection or co-infection with other pathogens not detected by the test. Clinical correlation with patient history and other diagnostic information is necessary to determine patient infection status. The expected result is Negative.  Fact Sheet for Patients: EntrepreneurPulse.com.au  Fact Sheet for Healthcare Providers: IncredibleEmployment.be  This test is not yet approved or cleared by the [place] FDA and  has been authorized for detection and/or diagnosis of SARS-CoV-2 by FDA under an Emergency Use Authorization (EUA).  This EUA will remain in effect (meaning this test can be used) for the duration of  the COVID-19 declaration under Section 564(b)(1) of the A ct, 21 U.S.C. section 360bbb-3(b)(1), unless the  authorization is terminated or revoked sooner.     Influenza A by PCR NEGATIVE NEGATIVE Final   Influenza B by PCR NEGATIVE NEGATIVE Final    Comment: (NOTE) The Xpert Xpress SARS-CoV-2/FLU/RSV plus assay is intended as an aid in the diagnosis of influenza from Nasopharyngeal swab specimens and should not be used as a sole basis for treatment. Nasal washings and aspirates are unacceptable for Xpert Xpress SARS-CoV-2/FLU/RSV testing.  Fact Sheet for Patients: EntrepreneurPulse.com.au  Fact Sheet for Healthcare Providers: IncredibleEmployment.be  This test is not yet approved or cleared by the [place] FDA and has been authorized for detection and/or diagnosis of SARS-CoV-2 by FDA under an Emergency Use Authorization (EUA). This EUA will remain in effect (meaning this test can be used) for the duration of the COVID-19 declaration under Section 564(b)(1) of the Act, 21 U.S.C. section  360bbb-3(b)(1), unless the authorization is terminated or revoked.     Resp Syncytial Virus by PCR NEGATIVE NEGATIVE Final    Comment: (NOTE) Fact Sheet for Patients: EntrepreneurPulse.com.au  Fact Sheet for Healthcare Providers: IncredibleEmployment.be  This test is not yet approved or cleared by the [place] FDA and has been authorized for detection and/or diagnosis of SARS-CoV-2 by FDA under an Emergency Use Authorization (EUA). This EUA will remain in effect (meaning this test can be used) for the duration of the COVID-19 declaration under Section 564(b)(1) of the Act, 21 U.S.C. section 360bbb-3(b)(1), unless the authorization is terminated or revoked.  Performed at [facility], [address] [place], [place] [address]   Blood culture (routine x 2)     Status: None   Collection Time: 09/27/22  5:12 PM   Specimen: BLOOD  Result Value Ref Range Status   Specimen Description BLOOD LEFT ANTECUBITAL   Final   Special Requests   Final    BOTTLES DRAWN AEROBIC AND ANAEROBIC Blood Culture results may not be optimal due to an inadequate volume of blood received in culture bottles   Culture   Final    NO GROWTH 5 DAYS Performed at [facility], [address] [place], [place] [address]    Report Status 10/02/2022 FINAL  Final    Procedures and diagnostic studies:  No results found.             LOS: 8 days   Beth Goodlin  [facility]   Pager on [email or web address]. If 7PM-7AM, please contact night-coverage at [email or web address]     10/05/2022, 4:16 PM

## 2022-10-05 NOTE — Progress Notes (Signed)
Received patient in bed to unit.  Alert and oriented.  Informed consent signed and in chart.   TX duration:2.5 hours. NP ordered to cut tx to 2.5 hours before the start of treatment.  Patient tolerated well.  Transported back to the room  Alert, without acute distress.  Hand-off given to patient's nurse.   Access used: Left AVG Access issues: none  Total UF removed: 1.9L Medication(s) given: none    [facility]

## 2022-10-05 NOTE — Progress Notes (Signed)
[facility], [place] 10/05/22  Subjective:   LOS: 8  Patient known to our practice from outpatient dialysis.  He was recently diagnosed with COVID-19.  States that he came in because of left leg worsening redness and pain.  He was diagnosed with cellulitis and admitted for IV antibiotics.  Resting quietly in bed  Alert and oriented Denies pain or discomfort from left leg Tolerating meals with nausea and vomiting Remains on room air, denies shortness of breath  Patient seen later with attending during dialysis.   HEMODIALYSIS FLOWSHEET:  Blood Flow Rate (mL/min): 400 mL/min Arterial Pressure (mmHg): -190 mmHg Venous Pressure (mmHg): 250 mmHg TMP (mmHg): -8 mmHg Ultrafiltration Rate (mL/min): 0 mL/min Dialysate Flow Rate (mL/min): 300 ml/min Dialysis Fluid Bolus: Normal Saline Bolus Amount (mL): 100 mL    Objective:  Vital signs in last 24 hours:  Temp:  [97.7 F (36.5 C)-98.1 F (36.7 C)] 97.7 F (36.5 C) (02/06 1000) Pulse Rate:  [58-114] 108 (02/06 1330) Resp:  [16-20] 17 (02/06 1330) BP: (69-129)/(48-108) 108/62 (02/06 1330) SpO2:  [86 %-99 %] 98 % (02/06 1330) Weight:  [178.2 kg] 178.2 kg (02/06 1000)  Weight change:  Filed Weights   09/30/22 1753 10/02/22 1620 10/05/22 1000  Weight: (!) 175.3 kg (!) 179.7 kg (!) 178.2 kg    Intake/Output:    Intake/Output Summary (Last 24 hours) at 10/05/2022 1336 Last data filed at 10/04/2022 2211 Gross per 24 hour  Intake --  Output 1 ml  Net -1 ml     Physical Exam: General:  No acute distress, laying in the bed  HEENT  anicteric, moist oral mucous membrane  Pulm/lungs  normal breathing effort, lungs are clear to auscultation  CVS/Heart  regular rhythm, no rub or gallop  Abdomen:   Soft, nontender  Extremities: Trace -1+ peripheral edema, left leg bandaged.  Neurologic:  Alert, oriented, able to follow commands  Skin:  No acute rashes.  LLE ACE bandage  Dialysis access: Left arm AV  graft.    Basic Metabolic Panel:  Recent Labs  Lab 09/30/22 2210 10/01/22 0351 10/02/22 0418 10/03/22 0529 10/04/22 0402 10/05/22 1040  NA 133* 134* 132* 135 135 132*  K 4.9 5.4* 5.8* 4.6 5.0 5.3*  CL 93* 93* 93* 96* 96* 94*  CO2 '24 25 24 25 24 '$ 19*  GLUCOSE 242* 249* 254* 158* 151* 140*  BUN 93* 104* 125* 111* 126* 150*  CREATININE 11.94* 12.67* 14.29* 12.84* 14.30* 16.07*  CALCIUM 8.8* 8.7* 8.5* 8.3* 8.0* 7.8*  PHOS 6.5*  --  8.4*  --   --   --       CBC: Recent Labs  Lab 09/30/22 0654 09/30/22 1726 10/01/22 0351 10/02/22 0418 10/03/22 0529 10/04/22 0402 10/05/22 1040  WBC 20.8*   < > 19.6* 20.2* 15.5* 19.6* 14.5*  NEUTROABS 19.5*  --  18.2*  --  11.2* 15.1* 10.5*  HGB 12.2*   < > 11.1* 12.4* 12.3* 11.9* 11.8*  HCT 36.1*   < > 32.6* 37.5* 36.7* 34.9* 34.8*  MCV 93.3   < > 91.6 93.5 92.2 92.1 91.3  PLT 314   < > 334 369 333 311 318   < > = values in this interval not displayed.       Lab Results  Component Value Date   HEPBSAG NON REACTIVE 09/28/2022      Microbiology:  Recent Results (from the past 240 hour(s))  Blood culture (routine x 2)     Status:  None   Collection Time: 09/27/22  5:07 PM   Specimen: BLOOD  Result Value Ref Range Status   Specimen Description BLOOD RIGHT ANTECUBITAL  Final   Special Requests   Final    BOTTLES DRAWN AEROBIC AND ANAEROBIC Blood Culture results may not be optimal due to an inadequate volume of blood received in culture bottles   Culture   Final    NO GROWTH 5 DAYS Performed at [facility], [address] [place], [place] [address]    Report Status 10/02/2022 FINAL  Final  Resp panel by RT-PCR (RSV, Flu A&B, Covid) Anterior Nasal Swab     Status: Abnormal   Collection Time: 09/27/22  5:07 PM   Specimen: Anterior Nasal Swab  Result Value Ref Range Status   SARS Coronavirus 2 by RT PCR POSITIVE (A) NEGATIVE Final    Comment: (NOTE) SARS-CoV-2 target nucleic acids are DETECTED.  The SARS-CoV-2  RNA is generally detectable in upper respiratory specimens during the acute phase of infection. Positive results are indicative of the presence of the identified virus, but do not rule out bacterial infection or co-infection with other pathogens not detected by the test. Clinical correlation with patient history and other diagnostic information is necessary to determine patient infection status. The expected result is Negative.  Fact Sheet for Patients: EntrepreneurPulse.com.au  Fact Sheet for Healthcare Providers: IncredibleEmployment.be  This test is not yet approved or cleared by the [place] FDA and  has been authorized for detection and/or diagnosis of SARS-CoV-2 by FDA under an Emergency Use Authorization (EUA).  This EUA will remain in effect (meaning this test can be used) for the duration of  the COVID-19 declaration under Section 564(b)(1) of the A ct, 21 U.S.C. section 360bbb-3(b)(1), unless the authorization is terminated or revoked sooner.     Influenza A by PCR NEGATIVE NEGATIVE Final   Influenza B by PCR NEGATIVE NEGATIVE Final    Comment: (NOTE) The Xpert Xpress SARS-CoV-2/FLU/RSV plus assay is intended as an aid in the diagnosis of influenza from Nasopharyngeal swab specimens and should not be used as a sole basis for treatment. Nasal washings and aspirates are unacceptable for Xpert Xpress SARS-CoV-2/FLU/RSV testing.  Fact Sheet for Patients: EntrepreneurPulse.com.au  Fact Sheet for Healthcare Providers: IncredibleEmployment.be  This test is not yet approved or cleared by the [place] FDA and has been authorized for detection and/or diagnosis of SARS-CoV-2 by FDA under an Emergency Use Authorization (EUA). This EUA will remain in effect (meaning this test can be used) for the duration of the COVID-19 declaration under Section 564(b)(1) of the Act, 21 U.S.C. section  360bbb-3(b)(1), unless the authorization is terminated or revoked.     Resp Syncytial Virus by PCR NEGATIVE NEGATIVE Final    Comment: (NOTE) Fact Sheet for Patients: EntrepreneurPulse.com.au  Fact Sheet for Healthcare Providers: IncredibleEmployment.be  This test is not yet approved or cleared by the [place] FDA and has been authorized for detection and/or diagnosis of SARS-CoV-2 by FDA under an Emergency Use Authorization (EUA). This EUA will remain in effect (meaning this test can be used) for the duration of the COVID-19 declaration under Section 564(b)(1) of the Act, 21 U.S.C. section 360bbb-3(b)(1), unless the authorization is terminated or revoked.  Performed at [facility], [address] [place], [place] [address]   Blood culture (routine x 2)     Status: None   Collection Time: 09/27/22  5:12 PM   Specimen: BLOOD  Result Value Ref Range Status  Specimen Description BLOOD LEFT ANTECUBITAL  Final   Special Requests   Final    BOTTLES DRAWN AEROBIC AND ANAEROBIC Blood Culture results may not be optimal due to an inadequate volume of blood received in culture bottles   Culture   Final    NO GROWTH 5 DAYS Performed at [facility], [address], [place], [place] [address]    Report Status 10/02/2022 FINAL  Final    Coagulation Studies: Recent Labs    10/03/22 0529 10/04/22 0402 10/05/22 1040  LABPROT 27.0* 26.0* 27.8*  INR 2.5* 2.4* 2.6*     Urinalysis: No results for input(s): "COLORURINE", "LABSPEC", "PHURINE", "GLUCOSEU", "HGBUR", "BILIRUBINUR", "KETONESUR", "PROTEINUR", "UROBILINOGEN", "NITRITE", "LEUKOCYTESUR" in the last 72 hours.  Invalid input(s): "APPERANCEUR"    Imaging: No results found.   Medications:      allopurinol  100 mg Oral Daily   bromocriptine  2.5 mg Oral QHS   calcium acetate  2,001 mg Oral TID WC   Chlorhexidine Gluconate Cloth  6 each Topical Q0600    cinacalcet  30 mg Oral Q breakfast   doxycycline  100 mg Oral Q12H   insulin aspart  0-15 Units Subcutaneous TID WC   insulin aspart  0-5 Units Subcutaneous QHS   insulin glargine-yfgn  20 Units Subcutaneous QHS   multivitamin  1 tablet Oral Once per day on Mon Wed Fri   pentafluoroprop-tetrafluoroeth       polyethylene glycol  17 g Oral Daily   sodium chloride flush  3 mL Intravenous Q12H   warfarin  7.5 mg Oral ONCE-1600   Warfarin - Pharmacist Dosing Inpatient   Does not apply q1600   acetaminophen **OR** acetaminophen, amLODipine, atenolol, bisacodyl, lidocaine-prilocaine, melatonin, oxyCODONE, pentafluoroprop-tetrafluoroeth, polyvinyl alcohol, senna-docusate  Assessment/ Plan:  52 y.o. male with end-stage renal disease on hemodialysis, obstructive sleep apnea, insulin-dependent diabetes, hypertension, hyperlipidemia, morbid obesity, left lower extremity lymphedema, chronic venous insufficiency was admitted on 09/27/2022 for  Principal Problem:   Cellulitis of left lower extremity Active Problems:   Diabetes mellitus with hyperglycemia (HCC)   Chronic venous insufficiency   Venous ulcer of ankle, left (HCC)   CKD (chronic kidney disease) stage 5, GFR less than 15 ml/min (HCC)   Morbid obesity with BMI of 45.0-49.9, adult (HCC)   End-stage renal disease (HCC)   Diabetic ulcer of lower leg ([place])   COVID-19 virus infection  Cellulitis [L03.90] Cellulitis of left lower extremity [L03.116] Pneumonia due to COVID-19 virus [U07.1, J12.82]  #. ESRD [facility] dialysis/TTS / EDW 180 kg Receiving treatment today, UF goal 1.5-2L as tolerated. Next treatment scheduled for Tuesday.    #. Anemia of CKD  Lab Results  Component Value Date   HGB 11.8 (L) 10/05/2022  Hgb stable, no need for ESA's at this time.   #. Secondary hyperparathyroidism of renal origin N 25.81   No results found for: "PTH" Lab Results  Component Value Date   PHOS 8.4 (H) 10/02/2022   Awaiting updated phosphorus levels. Calcium acceptable at this time.  Continue calcium acetate and Cinacalcet.   #. Diabetes type 2 with CKD Hgb A1c MFr Bld (%)  Date Value  09/27/2022 8.8 (H)   Glucose well controlled during this admission. Sliding scale insulin managed by primary team.   #Left leg cellulitis, with underlying lymphedema. Continue vancomycin with dialysis and Rocephin, per primary team. Vascular surgery consulted and will schedule angiogram outpatient.    Covid -19 Supportive care   LOS: [place] 2/6/20241:36  [place], [place]

## 2022-10-05 NOTE — Discharge Summary (Addendum)
Physician Discharge Summary   Patient: [name] MRN: [id number] DOB: 25-Apr-1971  Admit date:     09/27/2022  Discharge date: 10/05/22  Discharge Physician: Jennye Boroughs   PCP: [name], MD   Recommendations at discharge:   Follow-up with PCP in 1 week Continue outpatient hemodialysis on Tuesdays, Thursdays and Saturdays Check INR on Thursday, 10/07/2022 for warfarin to be adjusted accordingly  Discharge Diagnoses: Principal Problem:   Cellulitis of left lower extremity Active Problems:   Diabetes mellitus with hyperglycemia (HCC)   Chronic venous insufficiency   Venous ulcer of ankle, left (HCC)   CKD (chronic kidney disease) stage 5, GFR less than 15 ml/min (HCC)   Morbid obesity with BMI of 45.0-49.9, adult ([facility])   End-stage renal disease ([place])   Diabetic ulcer of lower leg ([facility])   COVID-19 virus infection  Resolved Problems:   * No resolved hospital problems. *  Hospital Course:  Red A [name] is a 52 y.o. male with medical history significant for left lower extremity lymphedema, chronic venous insufficiency, remote history of lower extremity cellulitis, PVD, ESRD on hemodialysis, blood clot in dialysis graft on Coumadin, sleep apnea not on CPAP, insulin-dependent diabetes mellitus, hypertension, hypercholesterolemia, chronic anemia, morbid obesity.  He has a left ankle ulcer and he saw Dr. Delana Meyer, vascular surgeon, in the office on 08/19/2022 for this.  Local wound care and tramadol were prescribed at that time.  He noticed increasing pain, redness and swelling in the left leg and foot about a week prior to admission. He went to see Dr. Delana Meyer in the office on 09/26/2022.  He presented to the emergency room for further management.  He also complained of cough, body aches and decreased appetite.  He tested positive for COVID-19 infection on Saturday, 09/25/2022.   He was admitted to the hospital for cellulitis of the left lower extremity.       Assessment and  Plan:  Cellulitis of left lower extremity, chronic venous insufficiency bilateral legs, left leg lymphedema, suspected phlebolymphedema left leg left ankle ulcer, leukocytosis: Worsening leukocytosis.  Completed 7-day course of IV antibiotics (ceftriaxone and vancomycin).  Discharge on doxycycline to complete 10 days of antibiotics.   Outpatient follow-up with Dr. Delana Meyer, vascular surgeon, in 1 week     COVID-19 infection: IV dexamethasone was discontinued on 10/01/2018 after 4 doses..  Completed 3 doses of IV remdesivir on 09/29/2022.      Acute hypoxic respiratory failure: Resolved.  He is tolerating room air.       Insulin-dependent diabetes mellitus with hyperglycemia: Continue insulin glargine at discharge     ESRD, hyperkalemia: He had hemodialysis today.  Patient is okay for discharge from nephrologist's standpoint.  Discussed with Dr. Juleen China, nephrologist, via secure chat.  Continue hemodialysis on Tuesdays, Thursdays and Saturdays in the outpatient setting.    PVD, history of DVT, history of clot in AV graft: INR therapeutic at 2.6.  Continue warfarin.  Follow-up with the hemodialysis unit for repeat INR on 10/07/2022.    Elevated liver enzymes: Improved     General weakness: PT and OT recommended home health therapy    His condition has improved significantly.  He feels better and he is deemed stable for discharge to home today.       Consultants: Nephrologist, vascular surgeon Procedures performed: None  Disposition: Home health Diet recommendation:  Discharge Diet Orders (From admission, onward)     Start     Ordered   10/05/22 0000  Diet - low  sodium heart healthy        10/05/22 1439           Renal diet, carb modified diet DISCHARGE MEDICATION: Allergies as of 10/05/2022       Reactions   Lisinopril Swelling   Lips mouth    Furosemide Cough   Light headed, blurry vision, weakness.   Latex    Adhesive [tape] Itching, Rash        Medication List      TAKE these medications    acetaminophen 325 MG tablet Commonly known as: TYLENOL Take 2 tablets (650 mg total) by mouth every 6 (six) hours as needed for mild pain (or Fever >/= 101).   allopurinol 100 MG tablet Commonly known as: ZYLOPRIM Take 100 mg by mouth daily.   amLODipine 10 MG tablet Commonly known as: NORVASC Take 5 mg by mouth daily as needed (if bp is 190/90 or higher).   atenolol 50 MG tablet Commonly known as: TENORMIN Take 25 mg by mouth daily as needed (if bp is 190/90 or higher).   bromocriptine 2.5 MG tablet Commonly known as: PARLODEL TAKE 1 TABLET BY MOUTH AT BEDTIME.   calcium acetate 667 MG capsule Commonly known as: PHOSLO Take 1,334-2,001 mg by mouth See admin instructions. Take 2001 mg by mouth with each meal & take 1334 mg by mouth with each snack.   cinacalcet 30 MG tablet Commonly known as: SENSIPAR Take 30 mg by mouth daily.   CINNAMON PO Take 500 mg by mouth 2 (two) times daily.   doxycycline 100 MG tablet Commonly known as: VIBRA-TABS Take 1 tablet (100 mg total) by mouth every 12 (twelve) hours for 5 doses.   EYE HEALTH PO Take 1 capsule by mouth daily.   ibuprofen 200 MG tablet Commonly known as: ADVIL Take 200 mg by mouth every 6 (six) hours as needed for headache or moderate pain.   Lantus SoloStar 100 UNIT/ML Solostar Pen Generic drug: insulin glargine Inject 15 Units into the skin at bedtime.   lidocaine-prilocaine cream Commonly known as: EMLA Apply 1 application topically as needed (port access).   loratadine 10 MG tablet Commonly known as: CLARITIN Take 10 mg by mouth daily as needed for allergies.   melatonin 5 MG Tabs Take 5 mg by mouth at bedtime as needed (sleep).   multivitamin with minerals Tabs tablet Take 1 tablet by mouth 3 (three) times a week. Men's Multivitamin   Rena-Vite Rx 1 MG Tabs Take 1 tablet by mouth 3 (three) times a week.   sildenafil 100 MG tablet Commonly known as: VIAGRA Take 1  tablet (100 mg total) by mouth daily as needed for erectile dysfunction.   SYSTANE OP Place 1 drop into both eyes daily as needed (dry eyes).   traMADol 50 MG tablet Commonly known as: ULTRAM Take 1 tablet (50 mg total) by mouth at bedtime as needed for moderate pain or severe pain.   warfarin 5 MG tablet Commonly known as: COUMADIN Take 1.5 tablets (7.5 mg total) by mouth daily. What changed: how much to take               Durable Medical Equipment  (From admission, onward)           Start     Ordered   10/01/22 0945  For home use only DME Walker rolling  Once       Question Answer Comment  Walker: Other   Comments 5" wheels, bariatric   Patient  needs a walker to treat with the following condition Difficulty walking      10/01/22 0944   09/30/22 0000  For home use only DME Walker rolling       Comments: Bariatric  Question Answer Comment  Walker: With 5 Inch Wheels   Patient needs a walker to treat with the following condition Lower extremity cellulitis   Patient needs a walker to treat with the following condition General weakness      09/30/22 1400              Discharge Care Instructions  (From admission, onward)           Start     Ordered   10/05/22 0000  Discharge wound care:       Comments: Cleanse with house skin cleanser, pat dry. Dry between digits. Cover area of ulceration with folded layer of xeroform gauze Kellie Simmering [phone]). Top with dry gauze, ABD pad. Wrap LLE with Kerlix roll gauze from just below toes to just below knee. Top Kerlix with 6-inch ACE bandage applied in a similar manner. Place foot (feet) into Prevalon boot.   10/05/22 1439            Follow-up Information     Schnier, Dolores Lory, MD Follow up in 1 week(s).   Specialties: Vascular Surgery, Cardiology, Radiology, Vascular Surgery Why: Follow up with Eulogio Ditch, Wound check with possible UNA Boot needed. Contact information: [place] [phone]                Discharge Exam: Danley Danker Weights   10/02/22 1620 10/05/22 1000 10/05/22 1350  Weight: (!) 179.7 kg (!) 178.2 kg (!) 177 kg   GEN: NAD SKIN: Warm and dry EYES: No pallor or icterus ENT: MMM CV: RRR PULM: CTA B ABD: soft, obese, NT, +BS CNS: AAO x 3, non focal EXT: Chronic lymphedema of left lower extremity.  Blister wounds on the left leg and left foot.  Some erythema of the left leg and foot, likely chronic.  Tenderness of the left lower extremity has improved             Condition at discharge: good  The results of significant diagnostics from this hospitalization (including imaging, microbiology, ancillary and laboratory) are listed below for reference.   Imaging Studies: US Venous Img Lower Bilateral (DVT)  Result Date: 09/27/2022 CLINICAL DATA:  Left lower extremity swelling for 1 month EXAM: BILATERAL LOWER EXTREMITY VENOUS DOPPLER ULTRASOUND TECHNIQUE: Gray-scale sonography with compression, as well as color and duplex ultrasound, were performed to evaluate the deep venous system(s) from the level of the common femoral vein through the popliteal and proximal calf veins. COMPARISON:  None Available. FINDINGS: VENOUS Normal compressibility of the common femoral, superficial femoral, and popliteal veins, as well as the visualized calf veins. Limited visualization of the left calf veins. Visualized portions of profunda femoral vein and great saphenous vein unremarkable. No filling defects to suggest DVT on grayscale or color Doppler imaging. Doppler waveforms show normal direction of venous flow, normal respiratory plasticity and response to augmentation. OTHER Soft tissue edema of the left calf. Limitations: none IMPRESSION: 1. Negative examination for deep venous thrombosis in the bilateral lower extremities. Limited visualization of the left calf veins. 2. Soft tissue edema of the left calf. Electronically Signed    By: Delanna Ahmadi M.D.   On: 09/27/2022 18:20   DG Chest Portable 1 View  Result Date: 09/27/2022  CLINICAL DATA:  Shortness of breath. Cellulitis. EXAM: PORTABLE CHEST 1 VIEW COMPARISON:  10/15/2017 FINDINGS: Borderline cardiomegaly with stable mediastinal contours. Mild diffuse peribronchial and interstitial thickening. Linear opacity in the right lower lung zone may represent atelectasis or fluid in the fissure. No large subpulmonic effusion. No confluent consolidation. No pneumothorax. No acute osseous findings. IMPRESSION: 1. Borderline cardiomegaly. 2. Mild diffuse peribronchial and interstitial thickening, may be pulmonary edema or bronchitic. 3. Linear opacity in the right lower lung zone may represent atelectasis or fluid in the fissure. Electronically Signed   By: Keith Rake M.D.   On: 09/27/2022 17:26    Microbiology: Results for orders placed or performed during the hospital encounter of 09/27/22  Blood culture (routine x 2)     Status: None   Collection Time: 09/27/22  5:07 PM   Specimen: BLOOD  Result Value Ref Range Status   Specimen Description BLOOD RIGHT ANTECUBITAL  Final   Special Requests   Final    BOTTLES DRAWN AEROBIC AND ANAEROBIC Blood Culture results may not be optimal due to an inadequate volume of blood received in culture bottles   Culture   Final    NO GROWTH 5 DAYS Performed at [facility], [address] [place], [place] [address]    Report Status 10/02/2022 FINAL  Final  Resp panel by RT-PCR (RSV, Flu A&B, Covid) Anterior Nasal Swab     Status: Abnormal   Collection Time: 09/27/22  5:07 PM   Specimen: Anterior Nasal Swab  Result Value Ref Range Status   SARS Coronavirus 2 by RT PCR POSITIVE (A) NEGATIVE Final    Comment: (NOTE) SARS-CoV-2 target nucleic acids are DETECTED.  The SARS-CoV-2 RNA is generally detectable in upper respiratory specimens during the acute phase of infection. Positive results are indicative of the presence of the  identified virus, but do not rule out bacterial infection or co-infection with other pathogens not detected by the test. Clinical correlation with patient history and other diagnostic information is necessary to determine patient infection status. The expected result is Negative.  Fact Sheet for Patients: EntrepreneurPulse.com.au  Fact Sheet for Healthcare Providers: IncredibleEmployment.be  This test is not yet approved or cleared by the [place] FDA and  has been authorized for detection and/or diagnosis of SARS-CoV-2 by FDA under an Emergency Use Authorization (EUA).  This EUA will remain in effect (meaning this test can be used) for the duration of  the COVID-19 declaration under Section 564(b)(1) of the A ct, 21 U.S.C. section 360bbb-3(b)(1), unless the authorization is terminated or revoked sooner.     Influenza A by PCR NEGATIVE NEGATIVE Final   Influenza B by PCR NEGATIVE NEGATIVE Final    Comment: (NOTE) The Xpert Xpress SARS-CoV-2/FLU/RSV plus assay is intended as an aid in the diagnosis of influenza from Nasopharyngeal swab specimens and should not be used as a sole basis for treatment. Nasal washings and aspirates are unacceptable for Xpert Xpress SARS-CoV-2/FLU/RSV testing.  Fact Sheet for Patients: EntrepreneurPulse.com.au  Fact Sheet for Healthcare Providers: IncredibleEmployment.be  This test is not yet approved or cleared by the [place] FDA and has been authorized for detection and/or diagnosis of SARS-CoV-2 by FDA under an Emergency Use Authorization (EUA). This EUA will remain in effect (meaning this test can be used) for the duration of the COVID-19 declaration under Section 564(b)(1) of the Act, 21 U.S.C. section 360bbb-3(b)(1), unless the authorization is terminated or revoked.     Resp Syncytial Virus by PCR NEGATIVE NEGATIVE  Final    Comment: (NOTE) Fact Sheet for  Patients: EntrepreneurPulse.com.au  Fact Sheet for Healthcare Providers: IncredibleEmployment.be  This test is not yet approved or cleared by the [place] FDA and has been authorized for detection and/or diagnosis of SARS-CoV-2 by FDA under an Emergency Use Authorization (EUA). This EUA will remain in effect (meaning this test can be used) for the duration of the COVID-19 declaration under Section 564(b)(1) of the Act, 21 U.S.C. section 360bbb-3(b)(1), unless the authorization is terminated or revoked.  Performed at [facility], [address] [place], [place] [address]   Blood culture (routine x 2)     Status: None   Collection Time: 09/27/22  5:12 PM   Specimen: BLOOD  Result Value Ref Range Status   Specimen Description BLOOD LEFT ANTECUBITAL  Final   Special Requests   Final    BOTTLES DRAWN AEROBIC AND ANAEROBIC Blood Culture results may not be optimal due to an inadequate volume of blood received in culture bottles   Culture   Final    NO GROWTH 5 DAYS Performed at [facility], [address] [place], [place] [address]    Report Status 10/02/2022 FINAL  Final    Labs: CBC: Recent Labs  Lab 09/30/22 0654 09/30/22 1726 10/01/22 0351 10/02/22 0418 10/03/22 0529 10/04/22 0402 10/05/22 1040  WBC 20.8*   < > 19.6* 20.2* 15.5* 19.6* 14.5*  NEUTROABS 19.5*  --  18.2*  --  11.2* 15.1* 10.5*  HGB 12.2*   < > 11.1* 12.4* 12.3* 11.9* 11.8*  HCT 36.1*   < > 32.6* 37.5* 36.7* 34.9* 34.8*  MCV 93.3   < > 91.6 93.5 92.2 92.1 91.3  PLT 314   < > 334 369 333 311 318   < > = values in this interval not displayed.   Basic Metabolic Panel: Recent Labs  Lab 09/30/22 2210 10/01/22 0351 10/02/22 0418 10/03/22 0529 10/04/22 0402 10/05/22 1030 10/05/22 1040  NA 133* 134* 132* 135 135  --  132*  K 4.9 5.4* 5.8* 4.6 5.0  --  5.3*  CL 93* 93* 93* 96* 96*  --  94*  CO2 '24 25 24 25 24  '$ --  19*  GLUCOSE 242* 249*  254* 158* 151*  --  140*  BUN 93* 104* 125* 111* 126*  --  150*  CREATININE 11.94* 12.67* 14.29* 12.84* 14.30*  --  16.07*  CALCIUM 8.8* 8.7* 8.5* 8.3* 8.0*  --  7.8*  PHOS 6.5*  --  8.4*  --   --  9.7*  --    Liver Function Tests: Recent Labs  Lab 09/29/22 0608 09/30/22 2210 10/02/22 0418  AST 26  --   --   ALT 33  --   --   ALKPHOS 107  --   --   BILITOT 1.0  --   --   PROT 7.5  --   --   ALBUMIN 2.4* 2.5* 2.4*   CBG: Recent Labs  Lab 10/04/22 0807 10/04/22 1133 10/04/22 1607 10/04/22 1940 10/05/22 0919  GLUCAP 124* 97 135* 191* 135*    Discharge time spent: greater than 30 minutes.  Signed: Jennye Boroughs, MD [facility] 10/05/2022

## 2022-10-05 NOTE — Care Management Important Message (Signed)
Important Message  Patient Details  Name: WATARU MCCOWEN MRN: [id number] Date of Birth: 1971/01/04   Medicare Important Message Given:  Other (see comment)   Attempted to reach patient via room phone [phone]) to review Medicare IM.  Unable to reach upon attempt.    Dannette Barbara 10/05/2022, 1:26 PM

## 2022-10-05 NOTE — Progress Notes (Signed)
OT Cancellation Note  Patient Details Name: [name] MRN: [id number] DOB: 07/25/1971   Cancelled Treatment:    Reason Eval/Treat Not Completed: Other (comment). Spoke with RN who reports pt is currently receiving dialysis in his room. MD and pharmacist also seen entering pt's room. Will re-attempt as able.   Lanelle Bal  [facility] 10/05/2022, 11:52 AM

## 2022-10-05 NOTE — Consult Note (Signed)
ANTICOAGULATION CONSULT NOTE - Follow Up Consult  Pharmacy Consult for Warfarin Indication: DVT  Allergies  Allergen Reactions   Lisinopril Swelling    Lips mouth    Furosemide Cough    Light headed, blurry vision, weakness.   Latex    Adhesive [Tape] Itching and Rash    Patient Measurements: Height: '6\' 4"'$  (193 cm) Weight: (!) 178.2 kg (392 lb 13.8 oz) IBW/kg (Calculated) : 86.8   Vital Signs: Temp: 97.7 F (36.5 C) (02/06 1000) Temp Source: Oral (02/06 1000) BP: 116/68 (02/06 1200) Pulse Rate: 103 (02/06 1200)  Labs: Recent Labs    10/03/22 0529 10/04/22 0402 10/05/22 1040  HGB 12.3* 11.9* 11.8*  HCT 36.7* 34.9* 34.8*  PLT 333 311 318  LABPROT 27.0* 26.0* 27.8*  INR 2.5* 2.4* 2.6*  CREATININE 12.84* 14.30* 16.07*     Estimated Creatinine Clearance: 9.4 mL/min (A) (by C-G formula based on SCr of 16.07 mg/dL (H)).   Medications:  Facility-Administered Medications Prior to Admission  Medication Dose Route Frequency Provider Last Rate Last Admin   [DISCONTINUED] 0.9 %  sodium chloride infusion   Intravenous PRN Causey, Charlestine Massed, NP       Medications Prior to Admission  Medication Sig Dispense Refill Last Dose   allopurinol (ZYLOPRIM) 100 MG tablet Take 100 mg by mouth daily.   09/26/2022   B Complex-C-Folic Acid (RENA-VITE RX) 1 MG TABS Take 1 tablet by mouth 3 (three) times a week.    Past Week   bromocriptine (PARLODEL) 2.5 MG tablet TAKE 1 TABLET BY MOUTH AT BEDTIME. 90 tablet 3 Past Month   calcium acetate (PHOSLO) 667 MG capsule Take 1,334-2,001 mg by mouth See admin instructions. Take 2001 mg by mouth with each meal & take 1334 mg by mouth with each snack.   09/26/2022   cinacalcet (SENSIPAR) 30 MG tablet Take 30 mg by mouth daily.   09/26/2022   CINNAMON PO Take 500 mg by mouth 2 (two) times daily.    09/26/2022   Insulin Glargine (LANTUS SOLOSTAR) 100 UNIT/ML Solostar Pen Inject 15 Units into the skin at bedtime.    09/26/2022   Multiple Vitamin  (MULTIVITAMIN WITH MINERALS) TABS tablet Take 1 tablet by mouth 3 (three) times a week. Men's Multivitamin   Past Week   Multiple Vitamins-Minerals (EYE HEALTH PO) Take 1 capsule by mouth daily.    09/26/2022   warfarin (COUMADIN) 5 MG tablet Take 10 mg by mouth daily.    09/26/2022   amLODipine (NORVASC) 10 MG tablet Take 5 mg by mouth daily as needed (if bp is 190/90 or higher).    prn at prn   atenolol (TENORMIN) 50 MG tablet Take 25 mg by mouth daily as needed (if bp is 190/90 or higher).    prn at prn   ibuprofen (ADVIL) 200 MG tablet Take 200 mg by mouth every 6 (six) hours as needed for headache or moderate pain.   prn at prn   lidocaine-prilocaine (EMLA) cream Apply 1 application topically as needed (port access).   prn at prn   loratadine (CLARITIN) 10 MG tablet Take 10 mg by mouth daily as needed for allergies.   prn at prn   melatonin 5 MG TABS Take 5 mg by mouth at bedtime as needed (sleep).    prn at prn   Polyethyl Glycol-Propyl Glycol (SYSTANE OP) Place 1 drop into both eyes daily as needed (dry eyes).      sildenafil (VIAGRA) 100 MG tablet Take 1  tablet (100 mg total) by mouth daily as needed for erectile dysfunction. 30 tablet 3 prn at prn   traMADol (ULTRAM) 50 MG tablet Take 1 tablet (50 mg total) by mouth at bedtime as needed for moderate pain or severe pain. 25 tablet 0 prn at prn    Assessment: [name] is a 52 YO M with a PMH of ESRD on dialysis, DM, HLN, and lymphedema of the left LE with prior remote leg cellulitis. Was diagnosed to have progressive cellulitis, DVT, necrotizing infection, osteomyelitis and positive for COVID. [name] was on warfarin previously for blood clots during dialysis treatment. Pharmacy has been consulted for warfarin therapy.   DDI: doxycycline  1/29: '10mg'$  PO x 1  1/30: HOLD 1/31: INR: 4.1.HOLD  2/01: INR: 4.1 HOLD  2/02: INR: 3.3, 2.'5mg'$   2/03: INR: 3.1, 2.'5mg'$  2/04: INR: 2.5, '10mg'$  2/05: INR: 2.4, '10mg'$  2/06: INR: 2.6, 7.5 mg  Goal of Therapy:  INR  2-3 Monitor platelets by anticoagulation protocol: Yes   Plan:  - Will give 7.5 mg warfarin x1 today.  - Check INR with am labs.   Lorin Picket, PharmD Clinical Pharmacist 10/05/2022 12:28 PM

## 2022-10-06 ENCOUNTER — Telehealth (INDEPENDENT_AMBULATORY_CARE_PROVIDER_SITE_OTHER): Payer: Self-pay | Admitting: Vascular Surgery

## 2022-10-06 ENCOUNTER — Other Ambulatory Visit (INDEPENDENT_AMBULATORY_CARE_PROVIDER_SITE_OTHER): Payer: Self-pay | Admitting: Nurse Practitioner

## 2022-10-06 MED ORDER — TRAMADOL HCL 50 MG PO TABS: 50.0000 mg | ORAL_TABLET | Freq: Every evening | ORAL | 0 refills | Status: DC | PRN

## 2022-10-06 NOTE — Telephone Encounter (Signed)
Sent!

## 2022-10-06 NOTE — Telephone Encounter (Signed)
Patient called in requesting medication refill   traMADol (ULTRAM) 50 MG tablet    [id number] [place], [place] - [address]

## 2022-10-07 DIAGNOSIS — N2581 Secondary hyperparathyroidism of renal origin: Secondary | ICD-10-CM | POA: Diagnosis not present

## 2022-10-07 DIAGNOSIS — Z992 Dependence on renal dialysis: Secondary | ICD-10-CM | POA: Diagnosis not present

## 2022-10-07 DIAGNOSIS — N186 End stage renal disease: Secondary | ICD-10-CM | POA: Diagnosis not present

## 2022-10-09 DIAGNOSIS — Z992 Dependence on renal dialysis: Secondary | ICD-10-CM | POA: Diagnosis not present

## 2022-10-09 DIAGNOSIS — N2581 Secondary hyperparathyroidism of renal origin: Secondary | ICD-10-CM | POA: Diagnosis not present

## 2022-10-09 DIAGNOSIS — N186 End stage renal disease: Secondary | ICD-10-CM | POA: Diagnosis not present

## 2022-10-10 DIAGNOSIS — D631 Anemia in chronic kidney disease: Secondary | ICD-10-CM | POA: Diagnosis not present

## 2022-10-10 DIAGNOSIS — E1151 Type 2 diabetes mellitus with diabetic peripheral angiopathy without gangrene: Secondary | ICD-10-CM | POA: Diagnosis not present

## 2022-10-10 DIAGNOSIS — I890 Lymphedema, not elsewhere classified: Secondary | ICD-10-CM | POA: Diagnosis not present

## 2022-10-10 DIAGNOSIS — N186 End stage renal disease: Secondary | ICD-10-CM | POA: Diagnosis not present

## 2022-10-10 DIAGNOSIS — L03116 Cellulitis of left lower limb: Secondary | ICD-10-CM | POA: Diagnosis not present

## 2022-10-10 DIAGNOSIS — U071 COVID-19: Secondary | ICD-10-CM | POA: Diagnosis not present

## 2022-10-10 DIAGNOSIS — I120 Hypertensive chronic kidney disease with stage 5 chronic kidney disease or end stage renal disease: Secondary | ICD-10-CM | POA: Diagnosis not present

## 2022-10-10 DIAGNOSIS — E7800 Pure hypercholesterolemia, unspecified: Secondary | ICD-10-CM | POA: Diagnosis not present

## 2022-10-10 DIAGNOSIS — E1122 Type 2 diabetes mellitus with diabetic chronic kidney disease: Secondary | ICD-10-CM | POA: Diagnosis not present

## 2022-10-11 ENCOUNTER — Other Ambulatory Visit (INDEPENDENT_AMBULATORY_CARE_PROVIDER_SITE_OTHER): Payer: Self-pay | Admitting: Nurse Practitioner

## 2022-10-11 ENCOUNTER — Encounter (INDEPENDENT_AMBULATORY_CARE_PROVIDER_SITE_OTHER): Payer: Self-pay | Admitting: Nurse Practitioner

## 2022-10-11 ENCOUNTER — Ambulatory Visit (INDEPENDENT_AMBULATORY_CARE_PROVIDER_SITE_OTHER): Payer: Medicare HMO

## 2022-10-11 ENCOUNTER — Ambulatory Visit (INDEPENDENT_AMBULATORY_CARE_PROVIDER_SITE_OTHER): Payer: Medicare HMO | Admitting: Nurse Practitioner

## 2022-10-11 VITALS — BP 151/74 | HR 66 | Ht 76.0 in | Wt 390.0 lb

## 2022-10-11 DIAGNOSIS — S81809S Unspecified open wound, unspecified lower leg, sequela: Secondary | ICD-10-CM

## 2022-10-11 DIAGNOSIS — I10 Essential (primary) hypertension: Secondary | ICD-10-CM

## 2022-10-11 DIAGNOSIS — I7025 Atherosclerosis of native arteries of other extremities with ulceration: Secondary | ICD-10-CM

## 2022-10-11 DIAGNOSIS — E1165 Type 2 diabetes mellitus with hyperglycemia: Secondary | ICD-10-CM

## 2022-10-11 DIAGNOSIS — Z794 Long term (current) use of insulin: Secondary | ICD-10-CM

## 2022-10-11 LAB — VAS US ABI WITH/WO TBI

## 2022-10-11 MED ORDER — HYDROCODONE-ACETAMINOPHEN 5-325 MG PO TABS: 1.0000 | ORAL_TABLET | Freq: Four times a day (QID) | ORAL | 0 refills | Status: AC | PRN

## 2022-10-11 MED ORDER — DOXYCYCLINE HYCLATE 100 MG PO CAPS: 100.0000 mg | ORAL_CAPSULE | Freq: Two times a day (BID) | ORAL | 0 refills | Status: DC

## 2022-10-11 NOTE — Progress Notes (Signed)
Subjective:    Patient ID: [name], male    DOB: [birth year], 52 y.o.   MRN: [id number] Chief Complaint  Patient presents with   Follow-up    [name] is a 52 year old male that presents today for follow-up wound evaluation after recent hospitalization.  The patient has multiple wounds on his left lower extremity that presented after blisters.  He was in the hospital due to cellulitis.  There was additional concern that given the patient's history of diabetes mellitus, hypertension and end-stage renal disease that he may have some underlying arterial insufficiency driving his wound and delayed healing.  Despite treatment with an in-home nurse as well as at the hospital, the patient's wounds have not been improving.  At this time his cellulitis has mostly appeared to resolve however he does continue to have some significant draining of the wounds.  Today the patient underwent noninvasive studies which shows noncompressible ABIs bilaterally.  This may be partially related to medial calcification or significant edema.  TBI on the patient's right is 0.61 and 0.33 on the left.  Additional monophasic waveforms on the right tibial vessels with dampened monophasic waveforms on the left.    Review of Systems  Cardiovascular:  Positive for leg swelling.  Skin:  Positive for color change and wound.  All other systems reviewed and are negative.      Objective:   Physical Exam Vitals reviewed.  HENT:     Head: Normocephalic.  Cardiovascular:     Rate and Rhythm: Normal rate.  Pulmonary:     Effort: Pulmonary effort is normal.  Musculoskeletal:     Left lower leg: Edema present.  Skin:    General: Skin is warm and dry.  Neurological:     Mental Status: He is alert and oriented to person, place, and time.  Psychiatric:        Mood and Affect: Mood normal.        Behavior: Behavior normal.        Thought Content: Thought content normal.        Judgment: Judgment normal.     BP (!)  151/74   Pulse 66   Ht 6' 4"$  (1.93 m)   Wt (!) 390 lb (176.9 kg)   BMI 47.47 kg/m   Past Medical History:  Diagnosis Date   Anemia    Chronic kidney disease    14% FUNCTION   Diabetes mellitus without complication (HCC)    Diabetic retinopathy ([place])    Dyspnea    DOE   Dysrhythmia    End stage renal disease (HCC)    History of orthopnea    error wrong patietn   Hypercholesteremia    error   Hyperkalemia    error wrong patient   Hypertension    Long-term insulin use (HCC)    Lymphedema of leg    Metabolic encephalopathy    error   Microalbuminuria    MSSA (methicillin susceptible Staphylococcus aureus)    Obesity    PVD (peripheral vascular disease) (HCC)    Respiratory failure (HCC)    Error   Sepsis (HCC)    error   Sleep apnea    no CPAP   UTI (urinary tract infection)    error   Venous ulcer (HCC)    Vitamin D deficiency     Social History   Socioeconomic History   Marital status: Divorced    Spouse name: Not on file   Number of children:  Not on file   Years of education: Not on file   Highest education level: Not on file  Occupational History   Not on file  Tobacco Use   Smoking status: Former    Types: Cigars    Passive exposure: Past   Smokeless tobacco: Former    Quit date: 09/30/2017   Tobacco comments:    occasional  Vaping Use   Vaping Use: Never used  Substance and Sexual Activity   Alcohol use: Yes    Alcohol/week: 1.0 standard drink of alcohol    Types: 1 Cans of beer per week    Comment: beer   Drug use: Not Currently   Sexual activity: Yes  Other Topics Concern   Not on file  Social History Narrative   Not on file   Social Determinants of Health   Financial Resource Strain: Not on file  Food Insecurity: No Food Insecurity (09/28/2022)   Hunger Vital Sign    Worried About Running Out of Food in the Last Year: Never true    Ran Out of Food in the Last Year: Never true  Transportation Needs: No Transportation Needs  (09/28/2022)   PRAPARE - Hydrologist (Medical): No    Lack of Transportation (Non-Medical): No  Physical Activity: Not on file  Stress: Not on file  Social Connections: Not on file  Intimate Partner Violence: Not At Risk (09/28/2022)   Humiliation, Afraid, Rape, and Kick questionnaire    Fear of Current or Ex-Partner: No    Emotionally Abused: No    Physically Abused: No    Sexually Abused: No    Past Surgical History:  Procedure Laterality Date   A/V FISTULAGRAM Right 10/24/2018   Procedure: A/V FISTULAGRAM;  Surgeon: Katha Cabal, MD;  Location: [place];  Service: Cardiovascular;  Laterality: Right;   A/V FISTULAGRAM Right 11/24/2018   Procedure: A/V FISTULAGRAM;  Surgeon: Katha Cabal, MD;  Location: [place] [facility];  Service: Cardiovascular;  Laterality: Right;   A/V FISTULAGRAM Left 07/31/2019   Procedure: A/V FISTULAGRAM;  Surgeon: Katha Cabal, MD;  Location: [place] [facility];  Service: Cardiovascular;  Laterality: Left;   A/V FISTULAGRAM Left 12/11/2019   Procedure: A/V FISTULAGRAM;  Surgeon: Katha Cabal, MD;  Location: [place] [facility];  Service: Cardiovascular;  Laterality: Left;   A/V SHUNT INTERVENTION Right 02/21/2019   Procedure: A/V SHUNT INTERVENTION;  Surgeon: Katha Cabal, MD;  Location: [place] [facility];  Service: Cardiovascular;  Laterality: Right;   APPLICATION OF WOUND VAC Left 10/03/2017   Procedure: APPLICATION OF WOUND VAC;  Surgeon: Algernon Huxley, MD;  Location: [facility];  Service: General;  Laterality: Left;   APPLICATION OF WOUND VAC Left 10/11/2017   Procedure: APPLICATION OF WOUND VAC;  Surgeon: Katha Cabal, MD;  Location: [facility];  Service: Vascular;  Laterality: Left;   APPLICATION OF WOUND VAC Left 10/14/2017   Procedure: WOUND VAC CHANGE;  Surgeon: Katha Cabal, MD;  Location: [facility];  Service: Vascular;  Laterality: Left;  left lower leg   APPLICATION OF  WOUND VAC Left 10/18/2017   Procedure: WOUND VAC CHANGE;  Surgeon: Katha Cabal, MD;  Location: [facility];  Service: Vascular;  Laterality: Left;   APPLICATION OF WOUND VAC Left 10/07/2017   Procedure: APPLICATION OF WOUND VAC;  Surgeon: Katha Cabal, MD;  Location: [facility];  Service: Vascular;  Laterality: Left;   AV FISTULA INSERTION  W/ RF MAGNETIC GUIDANCE Right 07/19/2018   Procedure: AV FISTULA INSERTION W/RF MAGNETIC GUIDANCE;  Surgeon: Katha Cabal, MD;  Location: [place] [facility];  Service: Cardiovascular;  Laterality: Right;   AV FISTULA PLACEMENT Left 05/11/2019   Procedure: ARTERIOVENOUS (AV) FISTULA CREATION (RADIOCEPHALIC );  Surgeon: Katha Cabal, MD;  Location: [facility];  Service: Vascular;  Laterality: Left;   AV FISTULA PLACEMENT Left 02/20/2020   Procedure: INSERTION OF ARTERIOVENOUS (AV) GORE-TEX GRAFT LEFT ARM ( FOREARM LOOP );  Surgeon: Katha Cabal, MD;  Location: [facility];  Service: Vascular;  Laterality: Left;   CATARACT EXTRACTION W/PHACO Left 11/01/2018   Procedure: CATARACT EXTRACTION PHACO AND INTRAOCULAR LENS PLACEMENT (IOC)-LEFT, DIABETIC-INSULIN DEPENDENT;  Surgeon: Eulogio Bear, MD;  Location: [facility];  Service: Ophthalmology;  Laterality: Left;  [place] 00:41.8 CDE 4.61 Fluid Pack Lot # [id number]   CATARACT EXTRACTION W/PHACO Right 04/27/2019   Procedure: CATARACT EXTRACTION PHACO AND INTRAOCULAR LENS PLACEMENT (IOC);  Surgeon: Eulogio Bear, MD;  Location: [facility];  Service: Ophthalmology;  Laterality: Right;  [place] 00:34 CDE 1.97 FLUID PACK LOT # [id number]    COLONOSCOPY WITH PROPOFOL N/A 05/12/2020   Procedure: COLONOSCOPY WITH PROPOFOL;  Surgeon: Toledo, Benay Pike, MD;  Location: [facility];  Service: Gastroenterology;  Laterality: N/A;   DIALYSIS/PERMA CATHETER INSERTION N/A 02/23/2019   Procedure: DIALYSIS/PERMA CATHETER INSERTION;  Surgeon: Algernon Huxley, MD;  Location: [place] [facility];  Service: Cardiovascular;   Laterality: N/A;   DIALYSIS/PERMA CATHETER REMOVAL N/A 04/03/2020   Procedure: DIALYSIS/PERMA CATHETER REMOVAL;  Surgeon: Katha Cabal, MD;  Location: [place] [facility];  Service: Cardiovascular;  Laterality: N/A;   DIALYSIS/PERMA CATHETER REMOVAL N/A 02/03/2022   Procedure: DIALYSIS/PERMA CATHETER REMOVAL;  Surgeon: Katha Cabal, MD;  Location: [place] [facility];  Service: Cardiovascular;  Laterality: N/A;   I & D EXTREMITY Left 10/11/2017   Procedure: IRRIGATION AND DEBRIDEMENT EXTREMITY;  Surgeon: Katha Cabal, MD;  Location: [facility];  Service: Vascular;  Laterality: Left;   INCISION AND DRAINAGE ABSCESS Right 10/07/2017   Procedure: INCISION AND DRAINAGE ABSCESS;  Surgeon: Katha Cabal, MD;  Location: [facility];  Service: Vascular;  Laterality: Right;   IRRIGATION AND DEBRIDEMENT ABSCESS Left 10/03/2017   Procedure: IRRIGATION AND DEBRIDEMENT ABSCESS with debridement of skin, soft tissue, muscle 50sq cm;  Surgeon: Algernon Huxley, MD;  Location: [facility];  Service: General;  Laterality: Left;   TEMPORARY DIALYSIS CATHETER  02/20/2019   Procedure: TEMPORARY DIALYSIS CATHETER;  Surgeon: Katha Cabal, MD;  Location: [place] [facility];  Service: Cardiovascular;;   UPPER EXTREMITY ANGIOGRAPHY Left 09/18/2019   Procedure: UPPER EXTREMITY ANGIOGRAPHY;  Surgeon: Katha Cabal, MD;  Location: [place] [facility];  Service: Cardiovascular;  Laterality: Left;    Family History  Problem Relation Age of Onset   Diabetes Father    Kidney disease Father    Diabetes Daughter     Allergies  Allergen Reactions   Lisinopril Swelling    Lips mouth    Furosemide Cough    Light headed, blurry vision, weakness.   Latex    Adhesive [Tape] Itching and Rash       Latest Ref Rng & Units 10/05/2022   10:40 AM 10/04/2022    4:02 AM 10/03/2022    5:29 AM  CBC  WBC 4.0 - 10.5 K/uL 14.5  19.6  15.5   Hemoglobin 13.0 - 17.0 g/dL 11.8  11.9  12.3  Hematocrit 39.0 - 52.0 % 34.8   34.9  36.7   Platelets 150 - 400 K/uL 318  311  333       CMP     Component Value Date/Time   NA 132 (L) 10/05/2022 1040   K 5.3 (H) 10/05/2022 1040   CL 94 (L) 10/05/2022 1040   CO2 19 (L) 10/05/2022 1040   GLUCOSE 140 (H) 10/05/2022 1040   BUN 150 (H) 10/05/2022 1040   CREATININE 16.07 (H) 10/05/2022 1040   CALCIUM 7.8 (L) 10/05/2022 1040   PROT 7.5 09/29/2022 0608   ALBUMIN 2.4 (L) 10/02/2022 0418   AST 26 09/29/2022 0608   ALT 33 09/29/2022 0608   ALKPHOS 107 09/29/2022 0608   BILITOT 1.0 09/29/2022 0608   GFRNONAA 3 (L) 10/05/2022 1040   GFRAA 9 (L) 02/19/2020 1316     No results found.     Assessment & Plan:   1. Atherosclerosis of native arteries of the extremities with ulceration ([place])  Recommend:  The patient has evidence of severe atherosclerotic changes of both lower extremities associated with ulceration and tissue loss of the left foot.  This represents a limb threatening ischemia and places the patient at the risk for left limb loss.  Patient should undergo angiography of the left lower extremity with the hope for intervention for limb salvage.  The risks and benefits as well as the alternative therapies was discussed in detail with the patient.  All questions were answered.  Patient agrees to proceed with left lower extremity  angiography.  The patient will follow up with me in the office after the procedure.   2. Primary hypertension Continue antihypertensive medications as already ordered, these medications have been reviewed and there are no changes at this time.  3. Type 2 diabetes mellitus with hyperglycemia, with long-term current use of insulin (HCC) Continue hypoglycemic medications as already ordered, these medications have been reviewed and there are no changes at this time.  Hgb A1C to be monitored as already arranged by primary service   Current Outpatient Medications on File Prior to Visit  Medication Sig Dispense Refill   acetaminophen  (TYLENOL) 325 MG tablet Take 2 tablets (650 mg total) by mouth every 6 (six) hours as needed for mild pain (or Fever >/= 101).     allopurinol (ZYLOPRIM) 100 MG tablet Take 100 mg by mouth daily.     amLODipine (NORVASC) 10 MG tablet Take 5 mg by mouth daily as needed (if bp is 190/90 or higher).      atenolol (TENORMIN) 50 MG tablet Take 25 mg by mouth daily as needed (if bp is 190/90 or higher).      B Complex-C-Folic Acid (RENA-VITE RX) 1 MG TABS Take 1 tablet by mouth 3 (three) times a week.      bromocriptine (PARLODEL) 2.5 MG tablet TAKE 1 TABLET BY MOUTH AT BEDTIME. 90 tablet 3   calcium acetate (PHOSLO) 667 MG capsule Take 1,334-2,001 mg by mouth See admin instructions. Take 2001 mg by mouth with each meal & take 1334 mg by mouth with each snack.     cinacalcet (SENSIPAR) 30 MG tablet Take 30 mg by mouth daily.     CINNAMON PO Take 500 mg by mouth 2 (two) times daily.      ibuprofen (ADVIL) 200 MG tablet Take 200 mg by mouth every 6 (six) hours as needed for headache or moderate pain.     Insulin Glargine (LANTUS SOLOSTAR) 100 UNIT/ML Solostar Pen  Inject 15 Units into the skin at bedtime.      lidocaine-prilocaine (EMLA) cream Apply 1 application topically as needed (port access).     loratadine (CLARITIN) 10 MG tablet Take 10 mg by mouth daily as needed for allergies.     melatonin 5 MG TABS Take 5 mg by mouth at bedtime as needed (sleep).      Multiple Vitamin (MULTIVITAMIN WITH MINERALS) TABS tablet Take 1 tablet by mouth 3 (three) times a week. Men's Multivitamin     Multiple Vitamins-Minerals (EYE HEALTH PO) Take 1 capsule by mouth daily.      Polyethyl Glycol-Propyl Glycol (SYSTANE OP) Place 1 drop into both eyes daily as needed (dry eyes).     sildenafil (VIAGRA) 100 MG tablet Take 1 tablet (100 mg total) by mouth daily as needed for erectile dysfunction. 30 tablet 3   warfarin (COUMADIN) 5 MG tablet Take 1.5 tablets (7.5 mg total) by mouth daily.     No current  facility-administered medications on file prior to visit.    There are no Patient Instructions on file for this visit. No follow-ups on file.   Kris Hartmann, NP

## 2022-10-12 ENCOUNTER — Ambulatory Visit (INDEPENDENT_AMBULATORY_CARE_PROVIDER_SITE_OTHER): Payer: Medicare HMO | Admitting: Nurse Practitioner

## 2022-10-12 DIAGNOSIS — N2581 Secondary hyperparathyroidism of renal origin: Secondary | ICD-10-CM | POA: Diagnosis not present

## 2022-10-12 DIAGNOSIS — N186 End stage renal disease: Secondary | ICD-10-CM | POA: Diagnosis not present

## 2022-10-12 DIAGNOSIS — Z992 Dependence on renal dialysis: Secondary | ICD-10-CM | POA: Diagnosis not present

## 2022-10-13 ENCOUNTER — Telehealth (INDEPENDENT_AMBULATORY_CARE_PROVIDER_SITE_OTHER): Payer: Self-pay

## 2022-10-13 DIAGNOSIS — N186 End stage renal disease: Secondary | ICD-10-CM | POA: Diagnosis not present

## 2022-10-13 DIAGNOSIS — D631 Anemia in chronic kidney disease: Secondary | ICD-10-CM | POA: Diagnosis not present

## 2022-10-13 DIAGNOSIS — I890 Lymphedema, not elsewhere classified: Secondary | ICD-10-CM | POA: Diagnosis not present

## 2022-10-13 DIAGNOSIS — L03116 Cellulitis of left lower limb: Secondary | ICD-10-CM | POA: Diagnosis not present

## 2022-10-13 DIAGNOSIS — U071 COVID-19: Secondary | ICD-10-CM | POA: Diagnosis not present

## 2022-10-13 DIAGNOSIS — I120 Hypertensive chronic kidney disease with stage 5 chronic kidney disease or end stage renal disease: Secondary | ICD-10-CM | POA: Diagnosis not present

## 2022-10-13 DIAGNOSIS — E7800 Pure hypercholesterolemia, unspecified: Secondary | ICD-10-CM | POA: Diagnosis not present

## 2022-10-13 DIAGNOSIS — E1122 Type 2 diabetes mellitus with diabetic chronic kidney disease: Secondary | ICD-10-CM | POA: Diagnosis not present

## 2022-10-13 DIAGNOSIS — E1151 Type 2 diabetes mellitus with diabetic peripheral angiopathy without gangrene: Secondary | ICD-10-CM | POA: Diagnosis not present

## 2022-10-13 NOTE — Telephone Encounter (Signed)
Spoke with the patient and he is scheduled with Dr. Delana Meyer on 10/15/22 for a LLE angio with [facility] 11:00 [facility]. Pre-procedure instructions were discussed and patient stated he understood.

## 2022-10-14 DIAGNOSIS — N186 End stage renal disease: Secondary | ICD-10-CM | POA: Diagnosis not present

## 2022-10-14 DIAGNOSIS — N2581 Secondary hyperparathyroidism of renal origin: Secondary | ICD-10-CM | POA: Diagnosis not present

## 2022-10-14 DIAGNOSIS — Z992 Dependence on renal dialysis: Secondary | ICD-10-CM | POA: Diagnosis not present

## 2022-10-15 ENCOUNTER — Encounter
Admission: RE | Disposition: A | Discharge status: Home / Self Care | Payer: Self-pay | Source: Home / Self Care | Attending: Vascular Surgery

## 2022-10-15 ENCOUNTER — Encounter: Payer: Self-pay | Admitting: Vascular Surgery

## 2022-10-15 ENCOUNTER — Other Ambulatory Visit: Payer: Self-pay

## 2022-10-15 ENCOUNTER — Ambulatory Visit
Admission: RE | Admit: 2022-10-15 | Discharge: 2022-10-15 | Disposition: A | Discharge status: Home / Self Care | Payer: Medicare HMO | Attending: Vascular Surgery | Admitting: Vascular Surgery

## 2022-10-15 DIAGNOSIS — I70249 Atherosclerosis of native arteries of left leg with ulceration of unspecified site: Secondary | ICD-10-CM

## 2022-10-15 DIAGNOSIS — L97829 Non-pressure chronic ulcer of other part of left lower leg with unspecified severity: Secondary | ICD-10-CM | POA: Insufficient documentation

## 2022-10-15 DIAGNOSIS — I70248 Atherosclerosis of native arteries of left leg with ulceration of other part of lower left leg: Secondary | ICD-10-CM | POA: Diagnosis not present

## 2022-10-15 DIAGNOSIS — L97909 Non-pressure chronic ulcer of unspecified part of unspecified lower leg with unspecified severity: Secondary | ICD-10-CM

## 2022-10-15 DIAGNOSIS — L97929 Non-pressure chronic ulcer of unspecified part of left lower leg with unspecified severity: Secondary | ICD-10-CM

## 2022-10-15 HISTORY — PX: LOWER EXTREMITY ANGIOGRAPHY: CATH118251

## 2022-10-15 LAB — POTASSIUM (ARMC VASCULAR LAB ONLY): Potassium (ARMC vascular lab): 4.4 mmol/L (ref 3.5–5.1)

## 2022-10-15 LAB — GLUCOSE, CAPILLARY: Glucose-Capillary: 153 mg/dL — ABNORMAL HIGH (ref 70–99)

## 2022-10-15 SURGERY — LOWER EXTREMITY ANGIOGRAPHY
Anesthesia: Moderate Sedation | Site: Leg Lower | Laterality: Left

## 2022-10-15 MED ORDER — METHYLPREDNISOLONE SODIUM SUCC 125 MG IJ SOLR: 125.0000 mg | Freq: Once | INTRAMUSCULAR | Status: DC | PRN

## 2022-10-15 MED ORDER — MORPHINE SULFATE (PF) 4 MG/ML IV SOLN: 2.0000 mg | INTRAVENOUS | Status: DC | PRN

## 2022-10-15 MED ORDER — FAMOTIDINE 20 MG PO TABS: 40.0000 mg | ORAL_TABLET | Freq: Once | ORAL | Status: DC | PRN

## 2022-10-15 MED ORDER — SODIUM CHLORIDE 0.9 % IV SOLN: INTRAVENOUS | Status: DC

## 2022-10-15 MED ORDER — SODIUM CHLORIDE 0.9% FLUSH: 3.0000 mL | INTRAVENOUS | Status: DC | PRN

## 2022-10-15 MED ORDER — FENTANYL CITRATE (PF) 100 MCG/2ML IJ SOLN
INTRAMUSCULAR | Status: DC | PRN
  Administered 2022-10-15: 50 ug via INTRAVENOUS
  Administered 2022-10-15: 25 ug via INTRAVENOUS
  Administered 2022-10-15: 50 ug via INTRAVENOUS

## 2022-10-15 MED ORDER — HYDROMORPHONE HCL 1 MG/ML IJ SOLN: 1.0000 mg | Freq: Once | INTRAMUSCULAR | Status: DC | PRN

## 2022-10-15 MED ORDER — ATORVASTATIN CALCIUM 10 MG PO TABS: 10.0000 mg | ORAL_TABLET | Freq: Every evening | ORAL | Status: DC

## 2022-10-15 MED ORDER — FENTANYL CITRATE (PF) 100 MCG/2ML IJ SOLN
INTRAMUSCULAR | Status: AC
  Filled 2022-10-15: qty 2

## 2022-10-15 MED ORDER — FENTANYL CITRATE PF 50 MCG/ML IJ SOSY
PREFILLED_SYRINGE | INTRAMUSCULAR | Status: AC
  Filled 2022-10-15: qty 1

## 2022-10-15 MED ORDER — ACETAMINOPHEN 325 MG PO TABS: 650.0000 mg | ORAL_TABLET | ORAL | Status: DC | PRN

## 2022-10-15 MED ORDER — DIPHENHYDRAMINE HCL 50 MG/ML IJ SOLN: 50.0000 mg | Freq: Once | INTRAMUSCULAR | Status: DC | PRN

## 2022-10-15 MED ORDER — CLOPIDOGREL BISULFATE 75 MG PO TABS: 75.0000 mg | ORAL_TABLET | Freq: Every day | ORAL | 4 refills | Status: DC

## 2022-10-15 MED ORDER — ONDANSETRON HCL 4 MG/2ML IJ SOLN: 4.0000 mg | Freq: Four times a day (QID) | INTRAMUSCULAR | Status: DC | PRN

## 2022-10-15 MED ORDER — MIDAZOLAM HCL 2 MG/2ML IJ SOLN
INTRAMUSCULAR | Status: DC | PRN
  Administered 2022-10-15: 2 mg via INTRAVENOUS
  Administered 2022-10-15 (×2): 1 mg via INTRAVENOUS

## 2022-10-15 MED ORDER — IODIXANOL 320 MG/ML IV SOLN
INTRAVENOUS | Status: DC | PRN
  Administered 2022-10-15: 70 mL via INTRA_ARTERIAL

## 2022-10-15 MED ORDER — CEFAZOLIN SODIUM-DEXTROSE 1-4 GM/50ML-% IV SOLN
INTRAVENOUS | Status: AC
  Administered 2022-10-15: 1 g via INTRAVENOUS
  Filled 2022-10-15: qty 50

## 2022-10-15 MED ORDER — CEFAZOLIN SODIUM-DEXTROSE 1-4 GM/50ML-% IV SOLN: 1.0000 g | INTRAVENOUS | Status: AC

## 2022-10-15 MED ORDER — HEPARIN SODIUM (PORCINE) 1000 UNIT/ML IJ SOLN
INTRAMUSCULAR | Status: DC | PRN
  Administered 2022-10-15: 6000 [IU] via INTRAVENOUS

## 2022-10-15 MED ORDER — CLOPIDOGREL BISULFATE 75 MG PO TABS: 150.0000 mg | ORAL_TABLET | ORAL | Status: DC

## 2022-10-15 MED ORDER — HYDRALAZINE HCL 20 MG/ML IJ SOLN: 5.0000 mg | INTRAMUSCULAR | Status: DC | PRN

## 2022-10-15 MED ORDER — MIDAZOLAM HCL 2 MG/2ML IJ SOLN
INTRAMUSCULAR | Status: AC
  Filled 2022-10-15: qty 2

## 2022-10-15 MED ORDER — OXYCODONE HCL 5 MG PO TABS: 5.0000 mg | ORAL_TABLET | ORAL | Status: DC | PRN

## 2022-10-15 MED ORDER — SODIUM CHLORIDE 0.9 % IV SOLN: 250.0000 mL | INTRAVENOUS | Status: DC | PRN

## 2022-10-15 MED ORDER — HEPARIN SODIUM (PORCINE) 1000 UNIT/ML IJ SOLN
INTRAMUSCULAR | Status: AC
  Filled 2022-10-15: qty 10

## 2022-10-15 MED ORDER — MIDAZOLAM HCL 2 MG/ML PO SYRP: 8.0000 mg | ORAL_SOLUTION | Freq: Once | ORAL | Status: DC | PRN

## 2022-10-15 MED ORDER — ATORVASTATIN CALCIUM 10 MG PO TABS: 10.0000 mg | ORAL_TABLET | Freq: Every day | ORAL | 2 refills | Status: DC

## 2022-10-15 MED ORDER — SODIUM CHLORIDE 0.9% FLUSH: 3.0000 mL | Freq: Two times a day (BID) | INTRAVENOUS | Status: DC

## 2022-10-15 MED ORDER — LABETALOL HCL 5 MG/ML IV SOLN: 10.0000 mg | INTRAVENOUS | Status: DC | PRN

## 2022-10-15 SURGICAL SUPPLY — 30 items
BALLN ARMADA 1.5X40X150 (BALLOONS) ×1
BALLN ARMADA 2X40X150 (BALLOONS) ×1
BALLN EUPHORA RX 1.5X10 (BALLOONS) ×1
BALLN ULTRSCOR 014 2.5X200X150 (BALLOONS) ×1
BALLOON ARMADA 1.5X40X150 (BALLOONS) IMPLANT
BALLOON ARMADA 2X40X150 (BALLOONS) IMPLANT
BALLOON EUPHORA RX 1.5X10 (BALLOONS) IMPLANT
BALLOON ULTRSC 014 2.5X200X150 (BALLOONS) IMPLANT
CATH OMNI FLUSH 5F 65CM (CATHETERS) IMPLANT
CATH SEEKER .018X150 (CATHETERS) IMPLANT
CATH SEEKER 014X150 (CATHETERS) IMPLANT
CATH VERT 5FR 125CM (CATHETERS) IMPLANT
COVER PROBE ULTRASOUND 5X96 (MISCELLANEOUS) IMPLANT
DEVICE STARCLOSE SE CLOSURE (Vascular Products) IMPLANT
GLIDEWIRE ADV .035X260CM (WIRE) IMPLANT
GOWN STRL REUS W/ TWL LRG LVL3 (GOWN DISPOSABLE) ×1 IMPLANT
GOWN STRL REUS W/TWL LRG LVL3 (GOWN DISPOSABLE) ×1
GUIDEWIRE STR TIP .014X300X8 (WIRE) IMPLANT
KIT ENCORE 26 ADVANTAGE (KITS) IMPLANT
NDL ENTRY 21GA 7CM ECHOTIP (NEEDLE) IMPLANT
NEEDLE ENTRY 21GA 7CM ECHOTIP (NEEDLE) ×2 IMPLANT
PACK ANGIOGRAPHY (CUSTOM PROCEDURE TRAY) ×1 IMPLANT
SET INTRO CAPELLA COAXIAL (SET/KITS/TRAYS/PACK) IMPLANT
SHEATH BRITE TIP 6FRX11 (SHEATH) IMPLANT
SHEATH RAABE 6FRX70 (SHEATH) IMPLANT
SUT MNCRL AB 4-0 PS2 18 (SUTURE) IMPLANT
SYR MEDRAD MARK 7 150ML (SYRINGE) IMPLANT
TUBING CONTRAST HIGH PRESS 72 (TUBING) IMPLANT
WIRE G V18X300 ST (WIRE) IMPLANT
WIRE GUIDERIGHT .035X150 (WIRE) IMPLANT

## 2022-10-15 NOTE — Op Note (Signed)
[facility]  Percutaneous Study/Intervention Procedural Note   Date of Surgery: 10/15/2022  Surgeon:  [name]  Pre-operative Diagnosis: Atherosclerotic occlusive disease bilateral lower extremities with ulceration of the left lower extremity  Post-operative diagnosis:  Same  Procedure(s) Performed:             1.  Introduction catheter into left lower extremity 3rd order catheter placement               2.  Contrast injection left lower extremity for distal runoff with additional 3rd order             3.  Percutaneous transluminal angioplasty left anterior tibial                          4.  Star close closure right common femoral arteriotomy  Anesthesia: Conscious sedation was administered under my direct supervision by the interventional radiology RN. IV Versed plus fentanyl were utilized. Continuous ECG, pulse oximetry and blood pressure was monitored throughout the entire procedure.  Conscious sedation was for a total of 58 minutes.  Sheath: 6 [place] Rabie right common femoral retrograde  Contrast: 70 cc  Fluoroscopy Time: 13 minutes  Indications:  [name] presents with increasing pain of the left lower extremity.  This is associated with a venous ulcer which has not been healing.  This suggests the patient is having limb threatening ischemia. The risks and benefits are reviewed all questions answered patient agrees to proceed.  Procedure: [name] is a 52 y.o. y.o. male who was identified and appropriate procedural time out was performed.  The patient was then placed supine on the table and prepped and draped in the usual sterile fashion.    Ultrasound was placed in the sterile sleeve and the right groin was evaluated the right common femoral artery was echolucent and pulsatile indicating patency.  Image was recorded for the permanent record and under real-time visualization a microneedle was inserted into the common femoral artery  followed by the microwire and then the micro-sheath.  A J-wire was then advanced through the micro-sheath and a  5 [place] sheath was then inserted over a J-wire. J-wire was then advanced and a 5 French pigtail catheter was positioned at the level of T12.  AP projection of the aorta was then obtained. Pigtail catheter was repositioned to above the bifurcation and a RAO view of the pelvis was obtained.  Subsequently a pigtail catheter with the stiff angle Glidewire was used to cross the aortic bifurcation the catheter wire were advanced down into the left distal external iliac artery. Oblique view of the femoral bifurcation was then obtained and subsequently the wire was reintroduced and the pigtail catheter negotiated into the SFA representing third order catheter placement. Distal runoff was then performed.  6000 units of heparin was then given and allowed to circulate and a 6 [place] Rabie sheath was advanced up and over the bifurcation and positioned in the mid superficial femoral artery  Straight catheter and advantage Glidewire were then negotiated down into the distal popliteal and then into the anterior tibial. Catheter was then advanced. Hand injection contrast demonstrated the anterior tibial anatomy in detail.  I was then able to negotiate a V18 wire through the anterior tibial artery occlusion.  I was able to get a 2 mm x 40 mm balloon down to the distal lesion serial angioplasty of the anterior tibial was performed from this distal  lesion back treating the proximal half of the anterior tibial.  Follow-up imaging demonstrated less than 20% residual stenosis.  However, when he came to the distal lesion within the anterior tibial despite several different balloons and wires I was not able to cross this lesion and fully treat the distal anterior tibial.  At this point I elected to terminate the case.  After review of these images the sheath is pulled into the right external iliac oblique of the common  femoral is obtained and a Star close device deployed. There no immediate Complications.  Findings:  The abdominal aorta is opacified with a bolus injection contrast. Renal arteries are poorly visualized consistent with his renal failure. The aorta itself has diffuse disease but no hemodynamically significant lesions. The common and external iliac arteries are widely patent bilaterally.  The left common femoral is widely patent as is the profunda femoris.  The SFA and popliteal are widely patent with mild atherosclerotic changes.  The trifurcation is widely patent filling the tibioperoneal trunk peroneal and posterior tibial.  These are also widely patent without evidence of hemodynamically significant stenosis in minimal atherosclerotic disease.  The posterior tibial fills the plantar arteries and the pedal arch.  The anterior tibial occludes shortly after its origin and is reconstituted at the level of the ankle.    Following angioplasty the patency of the of the anterior tibial tibial is now improved.  Distally there is a lesion that I was able to cross with a wire but not able to treat with the balloon.                             Disposition: Patient was taken to the recovery room in stable condition having tolerated the procedure well.  [name] 10/15/2022,3:55 PM

## 2022-10-16 DIAGNOSIS — N186 End stage renal disease: Secondary | ICD-10-CM | POA: Diagnosis not present

## 2022-10-16 DIAGNOSIS — Z992 Dependence on renal dialysis: Secondary | ICD-10-CM | POA: Diagnosis not present

## 2022-10-16 DIAGNOSIS — N2581 Secondary hyperparathyroidism of renal origin: Secondary | ICD-10-CM | POA: Diagnosis not present

## 2022-10-18 ENCOUNTER — Encounter: Payer: Self-pay | Admitting: Vascular Surgery

## 2022-10-18 LAB — GLUCOSE, CAPILLARY: Glucose-Capillary: 125 mg/dL — ABNORMAL HIGH (ref 70–99)

## 2022-10-19 DIAGNOSIS — N2581 Secondary hyperparathyroidism of renal origin: Secondary | ICD-10-CM | POA: Diagnosis not present

## 2022-10-19 DIAGNOSIS — Z992 Dependence on renal dialysis: Secondary | ICD-10-CM | POA: Diagnosis not present

## 2022-10-19 DIAGNOSIS — N186 End stage renal disease: Secondary | ICD-10-CM | POA: Diagnosis not present

## 2022-10-20 ENCOUNTER — Telehealth (INDEPENDENT_AMBULATORY_CARE_PROVIDER_SITE_OTHER): Payer: Self-pay

## 2022-10-20 DIAGNOSIS — E1122 Type 2 diabetes mellitus with diabetic chronic kidney disease: Secondary | ICD-10-CM | POA: Diagnosis not present

## 2022-10-20 DIAGNOSIS — L03116 Cellulitis of left lower limb: Secondary | ICD-10-CM | POA: Diagnosis not present

## 2022-10-20 DIAGNOSIS — E7800 Pure hypercholesterolemia, unspecified: Secondary | ICD-10-CM | POA: Diagnosis not present

## 2022-10-20 DIAGNOSIS — U071 COVID-19: Secondary | ICD-10-CM | POA: Diagnosis not present

## 2022-10-20 DIAGNOSIS — I120 Hypertensive chronic kidney disease with stage 5 chronic kidney disease or end stage renal disease: Secondary | ICD-10-CM | POA: Diagnosis not present

## 2022-10-20 DIAGNOSIS — I890 Lymphedema, not elsewhere classified: Secondary | ICD-10-CM | POA: Diagnosis not present

## 2022-10-20 DIAGNOSIS — N186 End stage renal disease: Secondary | ICD-10-CM | POA: Diagnosis not present

## 2022-10-20 DIAGNOSIS — E1151 Type 2 diabetes mellitus with diabetic peripheral angiopathy without gangrene: Secondary | ICD-10-CM | POA: Diagnosis not present

## 2022-10-20 DIAGNOSIS — D631 Anemia in chronic kidney disease: Secondary | ICD-10-CM | POA: Diagnosis not present

## 2022-10-20 NOTE — Telephone Encounter (Signed)
Patient reach on yesterday asking if he should continue taking Warfarin since he has recently started taking Plavix. Per Dr Delana Meyer patient should take both blood thinners. Patient was notified with medical recommendations.

## 2022-10-21 DIAGNOSIS — Z992 Dependence on renal dialysis: Secondary | ICD-10-CM | POA: Diagnosis not present

## 2022-10-21 DIAGNOSIS — N186 End stage renal disease: Secondary | ICD-10-CM | POA: Diagnosis not present

## 2022-10-21 DIAGNOSIS — N2581 Secondary hyperparathyroidism of renal origin: Secondary | ICD-10-CM | POA: Diagnosis not present

## 2022-10-22 DIAGNOSIS — Z992 Dependence on renal dialysis: Secondary | ICD-10-CM | POA: Diagnosis not present

## 2022-10-22 DIAGNOSIS — N186 End stage renal disease: Secondary | ICD-10-CM | POA: Diagnosis not present

## 2022-10-22 DIAGNOSIS — N2581 Secondary hyperparathyroidism of renal origin: Secondary | ICD-10-CM | POA: Diagnosis not present

## 2022-10-23 DIAGNOSIS — N186 End stage renal disease: Secondary | ICD-10-CM | POA: Diagnosis not present

## 2022-10-23 DIAGNOSIS — N2581 Secondary hyperparathyroidism of renal origin: Secondary | ICD-10-CM | POA: Diagnosis not present

## 2022-10-23 DIAGNOSIS — Z992 Dependence on renal dialysis: Secondary | ICD-10-CM | POA: Diagnosis not present

## 2022-10-25 ENCOUNTER — Telehealth (INDEPENDENT_AMBULATORY_CARE_PROVIDER_SITE_OTHER): Payer: Self-pay | Admitting: Nurse Practitioner

## 2022-10-25 NOTE — Telephone Encounter (Signed)
Telephone documentation:  Patient called asking about his paperwork that needed to be filled out.  I told him it takes 5/10 business days.  He stated his job was asking for it and he would let them know.

## 2022-10-26 DIAGNOSIS — Z79899 Other long term (current) drug therapy: Secondary | ICD-10-CM | POA: Diagnosis not present

## 2022-10-26 DIAGNOSIS — Z992 Dependence on renal dialysis: Secondary | ICD-10-CM | POA: Diagnosis not present

## 2022-10-26 DIAGNOSIS — N2581 Secondary hyperparathyroidism of renal origin: Secondary | ICD-10-CM | POA: Diagnosis not present

## 2022-10-26 DIAGNOSIS — N186 End stage renal disease: Secondary | ICD-10-CM | POA: Diagnosis not present

## 2022-10-27 NOTE — Telephone Encounter (Signed)
His paperwork is done

## 2022-10-28 DIAGNOSIS — Z992 Dependence on renal dialysis: Secondary | ICD-10-CM | POA: Diagnosis not present

## 2022-10-28 DIAGNOSIS — N2581 Secondary hyperparathyroidism of renal origin: Secondary | ICD-10-CM | POA: Diagnosis not present

## 2022-10-28 DIAGNOSIS — N186 End stage renal disease: Secondary | ICD-10-CM | POA: Diagnosis not present

## 2022-10-29 DIAGNOSIS — E1122 Type 2 diabetes mellitus with diabetic chronic kidney disease: Secondary | ICD-10-CM | POA: Diagnosis not present

## 2022-10-29 DIAGNOSIS — T82898A Other specified complication of vascular prosthetic devices, implants and grafts, initial encounter: Secondary | ICD-10-CM | POA: Diagnosis not present

## 2022-10-29 DIAGNOSIS — N186 End stage renal disease: Secondary | ICD-10-CM | POA: Diagnosis not present

## 2022-10-29 DIAGNOSIS — T82858A Stenosis of vascular prosthetic devices, implants and grafts, initial encounter: Secondary | ICD-10-CM | POA: Diagnosis not present

## 2022-10-29 DIAGNOSIS — I871 Compression of vein: Secondary | ICD-10-CM | POA: Diagnosis not present

## 2022-10-29 DIAGNOSIS — N2581 Secondary hyperparathyroidism of renal origin: Secondary | ICD-10-CM | POA: Diagnosis not present

## 2022-10-29 DIAGNOSIS — I120 Hypertensive chronic kidney disease with stage 5 chronic kidney disease or end stage renal disease: Secondary | ICD-10-CM | POA: Diagnosis not present

## 2022-10-29 DIAGNOSIS — G4730 Sleep apnea, unspecified: Secondary | ICD-10-CM | POA: Diagnosis not present

## 2022-10-29 DIAGNOSIS — M1030 Gout due to renal impairment, unspecified site: Secondary | ICD-10-CM | POA: Diagnosis not present

## 2022-10-29 DIAGNOSIS — Z992 Dependence on renal dialysis: Secondary | ICD-10-CM | POA: Diagnosis not present

## 2022-10-30 DIAGNOSIS — N2581 Secondary hyperparathyroidism of renal origin: Secondary | ICD-10-CM | POA: Diagnosis not present

## 2022-10-30 DIAGNOSIS — Z992 Dependence on renal dialysis: Secondary | ICD-10-CM | POA: Diagnosis not present

## 2022-10-30 DIAGNOSIS — N186 End stage renal disease: Secondary | ICD-10-CM | POA: Diagnosis not present

## 2022-11-02 ENCOUNTER — Ambulatory Visit
Admission: RE | Admit: 2022-11-02 | Discharge: 2022-11-02 | Disposition: A | Discharge status: Home / Self Care | Payer: Medicare HMO | Attending: Nephrology | Admitting: Nephrology

## 2022-11-02 ENCOUNTER — Ambulatory Visit
Admission: RE | Admit: 2022-11-02 | Discharge: 2022-11-02 | Disposition: A | Discharge status: Home / Self Care | Payer: Medicare HMO | Source: Ambulatory Visit | Attending: Nephrology | Admitting: Nephrology

## 2022-11-02 ENCOUNTER — Other Ambulatory Visit: Payer: Self-pay | Admitting: Nephrology

## 2022-11-02 DIAGNOSIS — R0602 Shortness of breath: Secondary | ICD-10-CM

## 2022-11-02 DIAGNOSIS — N2581 Secondary hyperparathyroidism of renal origin: Secondary | ICD-10-CM | POA: Diagnosis not present

## 2022-11-02 DIAGNOSIS — N186 End stage renal disease: Secondary | ICD-10-CM | POA: Insufficient documentation

## 2022-11-02 DIAGNOSIS — Z992 Dependence on renal dialysis: Secondary | ICD-10-CM

## 2022-11-03 DIAGNOSIS — L03116 Cellulitis of left lower limb: Secondary | ICD-10-CM | POA: Diagnosis not present

## 2022-11-03 DIAGNOSIS — I890 Lymphedema, not elsewhere classified: Secondary | ICD-10-CM | POA: Diagnosis not present

## 2022-11-03 DIAGNOSIS — E1122 Type 2 diabetes mellitus with diabetic chronic kidney disease: Secondary | ICD-10-CM | POA: Diagnosis not present

## 2022-11-03 DIAGNOSIS — I120 Hypertensive chronic kidney disease with stage 5 chronic kidney disease or end stage renal disease: Secondary | ICD-10-CM | POA: Diagnosis not present

## 2022-11-03 DIAGNOSIS — D631 Anemia in chronic kidney disease: Secondary | ICD-10-CM | POA: Diagnosis not present

## 2022-11-03 DIAGNOSIS — E7800 Pure hypercholesterolemia, unspecified: Secondary | ICD-10-CM | POA: Diagnosis not present

## 2022-11-03 DIAGNOSIS — N186 End stage renal disease: Secondary | ICD-10-CM | POA: Diagnosis not present

## 2022-11-03 DIAGNOSIS — U071 COVID-19: Secondary | ICD-10-CM | POA: Diagnosis not present

## 2022-11-03 DIAGNOSIS — E1151 Type 2 diabetes mellitus with diabetic peripheral angiopathy without gangrene: Secondary | ICD-10-CM | POA: Diagnosis not present

## 2022-11-04 DIAGNOSIS — Z992 Dependence on renal dialysis: Secondary | ICD-10-CM | POA: Diagnosis not present

## 2022-11-04 DIAGNOSIS — N186 End stage renal disease: Secondary | ICD-10-CM | POA: Diagnosis not present

## 2022-11-04 DIAGNOSIS — N2581 Secondary hyperparathyroidism of renal origin: Secondary | ICD-10-CM | POA: Diagnosis not present

## 2022-11-06 DIAGNOSIS — N186 End stage renal disease: Secondary | ICD-10-CM | POA: Diagnosis not present

## 2022-11-06 DIAGNOSIS — Z992 Dependence on renal dialysis: Secondary | ICD-10-CM | POA: Diagnosis not present

## 2022-11-06 DIAGNOSIS — N2581 Secondary hyperparathyroidism of renal origin: Secondary | ICD-10-CM | POA: Diagnosis not present

## 2022-11-06 NOTE — Progress Notes (Unsigned)
MRN : [id number]  [name] is a 52 y.o. ([birth year]) male who presents with chief complaint of legs hurt and swell.  History of Present Illness:  Patient is seen for follow up evaluation of leg pain and swelling associated with venous ulceration. The patient was recently seen here and started on [place] boot therapy.  The swelling abruptly became much worse bilaterally and is associated with pain and discoloration. The pain and swelling worsens with prolonged dependency and improves with elevation.  The patient notes that in the morning the legs are better but the leg symptoms worsened throughout the course of the day. The patient has also noted a progressive worsening of the discoloration in the ankle and shin area.   The patient notes that an ulcer has developed acutely without specific trauma and since it occurred it has been very slow to heal.  There is a moderate amount of drainage associated with the open area.  The wound is also painful.  The patient states that they have been elevating as much as possible. The patient denies any recent changes in medications.  The patient denies a history of DVT or PE. There is no prior history of phlebitis. There is no history of primary lymphedema.  No SOB or increased cough.  No sputum production.  No recent episodes of CHF exacerbation.  No outpatient medications have been marked as taking for the 11/08/22 encounter (Appointment) with Delana Meyer, Dolores Lory, MD.    Past Medical History:  Diagnosis Date   Anemia    Chronic kidney disease    14% FUNCTION   Diabetes mellitus without complication ([place])    Diabetic retinopathy ([place])    Dyspnea    DOE   Dysrhythmia    End stage renal disease ([place])    History of orthopnea    error wrong patietn   Hypercholesteremia    error   Hyperkalemia    error wrong patient   Hypertension    Long-term insulin use ([place])    Lymphedema of leg    Metabolic encephalopathy    error    Microalbuminuria    MSSA (methicillin susceptible Staphylococcus aureus)    Obesity    PVD (peripheral vascular disease) (HCC)    Respiratory failure (HCC)    Error   Sepsis ([place])    error   Sleep apnea    no CPAP   UTI (urinary tract infection)    error   Venous ulcer ([place])    Vitamin D deficiency     Past Surgical History:  Procedure Laterality Date   A/V FISTULAGRAM Right 10/24/2018   Procedure: A/V FISTULAGRAM;  Surgeon: Katha Cabal, MD;  Location: [place] [facility];  Service: Cardiovascular;  Laterality: Right;   A/V FISTULAGRAM Right 11/24/2018   Procedure: A/V FISTULAGRAM;  Surgeon: Katha Cabal, MD;  Location: [facility] [place] [facility];  Service: Cardiovascular;  Laterality: Right;   A/V FISTULAGRAM Left 07/31/2019   Procedure: A/V FISTULAGRAM;  Surgeon: Katha Cabal, MD;  Location: [facility];  Service: Cardiovascular;  Laterality: Left;   A/V FISTULAGRAM Left 12/11/2019   Procedure: A/V FISTULAGRAM;  Surgeon: Katha Cabal, MD;  Location: [facility];  Service: Cardiovascular;  Laterality: Left;   A/V SHUNT INTERVENTION Right 02/21/2019   Procedure: A/V SHUNT INTERVENTION;  Surgeon: Katha Cabal, MD;  Location: [place] [facility];  Service: Cardiovascular;  Laterality: Right;   APPLICATION  OF WOUND VAC Left 10/03/2017   Procedure: APPLICATION OF WOUND VAC;  Surgeon: Algernon Huxley, MD;  Location: [facility];  Service: General;  Laterality: Left;   APPLICATION OF WOUND VAC Left 10/11/2017   Procedure: APPLICATION OF WOUND VAC;  Surgeon: Katha Cabal, MD;  Location: [facility];  Service: Vascular;  Laterality: Left;   APPLICATION OF WOUND VAC Left 10/14/2017   Procedure: WOUND VAC CHANGE;  Surgeon: Katha Cabal, MD;  Location: [facility];  Service: Vascular;  Laterality: Left;  left lower leg   APPLICATION OF WOUND VAC Left 10/18/2017   Procedure: WOUND VAC CHANGE;  Surgeon: Katha Cabal, MD;  Location: [facility];  Service:  Vascular;  Laterality: Left;   APPLICATION OF WOUND VAC Left 10/07/2017   Procedure: APPLICATION OF WOUND VAC;  Surgeon: Katha Cabal, MD;  Location: [facility];  Service: Vascular;  Laterality: Left;   AV FISTULA INSERTION W/ RF MAGNETIC GUIDANCE Right 07/19/2018   Procedure: AV FISTULA INSERTION W/RF MAGNETIC GUIDANCE;  Surgeon: Katha Cabal, MD;  Location: [place] [facility];  Service: Cardiovascular;  Laterality: Right;   AV FISTULA PLACEMENT Left 05/11/2019   Procedure: ARTERIOVENOUS (AV) FISTULA CREATION (RADIOCEPHALIC );  Surgeon: Katha Cabal, MD;  Location: [facility];  Service: Vascular;  Laterality: Left;   AV FISTULA PLACEMENT Left 02/20/2020   Procedure: INSERTION OF ARTERIOVENOUS (AV) GORE-TEX GRAFT LEFT ARM ( FOREARM LOOP );  Surgeon: Katha Cabal, MD;  Location: [facility];  Service: Vascular;  Laterality: Left;   CATARACT EXTRACTION W/PHACO Left 11/01/2018   Procedure: CATARACT EXTRACTION PHACO AND INTRAOCULAR LENS PLACEMENT (IOC)-LEFT, DIABETIC-INSULIN DEPENDENT;  Surgeon: Eulogio Bear, MD;  Location: [facility];  Service: Ophthalmology;  Laterality: Left;  [place] 00:41.8 CDE 4.61 Fluid Pack Lot # [id number]   CATARACT EXTRACTION W/PHACO Right 04/27/2019   Procedure: CATARACT EXTRACTION PHACO AND INTRAOCULAR LENS PLACEMENT (IOC);  Surgeon: Eulogio Bear, MD;  Location: [facility];  Service: Ophthalmology;  Laterality: Right;  [place] 00:34 CDE 1.97 FLUID PACK LOT # [id number]    COLONOSCOPY WITH PROPOFOL N/A 05/12/2020   Procedure: COLONOSCOPY WITH PROPOFOL;  Surgeon: [place], Benay Pike, MD;  Location: [facility];  Service: Gastroenterology;  Laterality: N/A;   DIALYSIS/PERMA CATHETER INSERTION N/A 02/23/2019   Procedure: DIALYSIS/PERMA CATHETER INSERTION;  Surgeon: Algernon Huxley, MD;  Location: [place] [facility];  Service: Cardiovascular;  Laterality: N/A;   DIALYSIS/PERMA CATHETER REMOVAL N/A 04/03/2020   Procedure: DIALYSIS/PERMA CATHETER REMOVAL;  Surgeon:  Katha Cabal, MD;  Location: [place] [facility];  Service: Cardiovascular;  Laterality: N/A;   DIALYSIS/PERMA CATHETER REMOVAL N/A 02/03/2022   Procedure: DIALYSIS/PERMA CATHETER REMOVAL;  Surgeon: Katha Cabal, MD;  Location: [place] [facility];  Service: Cardiovascular;  Laterality: N/A;   I & D EXTREMITY Left 10/11/2017   Procedure: IRRIGATION AND DEBRIDEMENT EXTREMITY;  Surgeon: Katha Cabal, MD;  Location: [facility];  Service: Vascular;  Laterality: Left;   INCISION AND DRAINAGE ABSCESS Right 10/07/2017   Procedure: INCISION AND DRAINAGE ABSCESS;  Surgeon: Katha Cabal, MD;  Location: [facility];  Service: Vascular;  Laterality: Right;   IRRIGATION AND DEBRIDEMENT ABSCESS Left 10/03/2017   Procedure: IRRIGATION AND DEBRIDEMENT ABSCESS with debridement of skin, soft tissue, muscle 50sq cm;  Surgeon: Algernon Huxley, MD;  Location: [facility];  Service: General;  Laterality: Left;   LOWER EXTREMITY ANGIOGRAPHY Left 10/15/2022   Procedure: Lower Extremity Angiography;  Surgeon: Katha Cabal, MD;  Location:  [place] [facility];  Service: Cardiovascular;  Laterality: Left;   TEMPORARY DIALYSIS CATHETER  02/20/2019   Procedure: TEMPORARY DIALYSIS CATHETER;  Surgeon: Katha Cabal, MD;  Location: [place] [facility];  Service: Cardiovascular;;   UPPER EXTREMITY ANGIOGRAPHY Left 09/18/2019   Procedure: UPPER EXTREMITY ANGIOGRAPHY;  Surgeon: Katha Cabal, MD;  Location: [place] [facility];  Service: Cardiovascular;  Laterality: Left;    Social History Social History   Tobacco Use   Smoking status: Former    Types: Cigars    Passive exposure: Past   Smokeless tobacco: Former    Quit date: 09/30/2017   Tobacco comments:    occasional  Vaping Use   Vaping Use: Never used  Substance Use Topics   Alcohol use: Yes    Alcohol/week: 1.0 standard drink of alcohol    Types: 1 Cans of beer per week    Comment: beer   Drug use: Not Currently    Family  History Family History  Problem Relation Age of Onset   Diabetes Father    Kidney disease Father    Diabetes Daughter     Allergies  Allergen Reactions   Lisinopril Swelling    Lips mouth    Furosemide Cough    Light headed, blurry vision, weakness.   Latex    Adhesive [Tape] Itching and Rash     REVIEW OF SYSTEMS (Negative unless checked)  Constitutional: '[]'$ Weight loss  '[]'$ Fever  '[]'$ Chills Cardiac: '[]'$ Chest pain   '[]'$ Chest pressure   '[]'$ Palpitations   '[]'$ Shortness of breath when laying flat   '[]'$ Shortness of breath with exertion. Vascular:  '[]'$ Pain in legs with walking   '[x]'$ Pain in legs at rest  '[]'$ History of DVT   '[]'$ Phlebitis   '[x]'$ Swelling in legs   '[]'$ Varicose veins   '[]'$ Non-healing ulcers Pulmonary:   '[]'$ Uses home oxygen   '[]'$ Productive cough   '[]'$ Hemoptysis   '[]'$ Wheeze  '[]'$ COPD   '[]'$ Asthma Neurologic:  '[]'$ Dizziness   '[]'$ Seizures   '[]'$ History of stroke   '[]'$ History of TIA  '[]'$ Aphasia   '[]'$ Vissual changes   '[]'$ Weakness or numbness in arm   '[]'$ Weakness or numbness in leg Musculoskeletal:   '[]'$ Joint swelling   '[]'$ Joint pain   '[]'$ Low back pain Hematologic:  '[]'$ Easy bruising  '[]'$ Easy bleeding   '[]'$ Hypercoagulable state   '[]'$ Anemic Gastrointestinal:  '[]'$ Diarrhea   '[]'$ Vomiting  '[]'$ Gastroesophageal reflux/heartburn   '[]'$ Difficulty swallowing. Genitourinary:  '[x]'$ Chronic kidney disease   '[]'$ Difficult urination  '[]'$ Frequent urination   '[]'$ Blood in urine Skin:  '[]'$ Rashes   '[]'$ Ulcers  Psychological:  '[]'$ History of anxiety   '[]'$  History of major depression.  Physical Examination  There were no vitals filed for this visit. There is no height or weight on file to calculate BMI. Gen: WD/WN, NAD Head: [place]/AT, No temporalis wasting.  Ear/Nose/Throat: Hearing grossly intact, nares w/o erythema or drainage, pinna without lesions Eyes: PER, EOMI, sclera nonicteric.  Neck: Supple, no gross masses.  No JVD.  Pulmonary:  Good air movement, no audible wheezing, no use of accessory muscles.  Cardiac: RRR, precordium not  hyperdynamic. Vascular:  scattered varicosities present bilaterally.  Moderate venous stasis changes to the legs bilaterally.  2+ soft pitting edema. CEAP [id number]   Vessel Right Left  Radial Palpable Palpable  Gastrointestinal: soft, non-distended. No guarding/no peritoneal signs.  Musculoskeletal: M/S 5/5 throughout.  No deformity.  Neurologic: CN 2-12 intact. Pain and light touch intact in extremities.  Symmetrical.  Speech is fluent. Motor exam as listed above. Psychiatric: Judgment intact, Mood & affect appropriate  for pt's clinical situation. Dermatologic: Venous rashes no ulcers noted.  No changes consistent with cellulitis. Lymph : No lichenification or skin changes of chronic lymphedema.  CBC Lab Results  Component Value Date   WBC 14.5 (H) 10/05/2022   HGB 11.8 (L) 10/05/2022   HCT 34.8 (L) 10/05/2022   MCV 91.3 10/05/2022   PLT 318 10/05/2022    BMET    Component Value Date/Time   NA 132 (L) 10/05/2022 1040   K 5.3 (H) 10/05/2022 1040   CL 94 (L) 10/05/2022 1040   CO2 19 (L) 10/05/2022 1040   GLUCOSE 140 (H) 10/05/2022 1040   BUN 150 (H) 10/05/2022 1040   CREATININE 16.07 (H) 10/05/2022 1040   CALCIUM 7.8 (L) 10/05/2022 1040   GFRNONAA 3 (L) 10/05/2022 1040   GFRAA 9 (L) 02/19/2020 1316   CrCl cannot be calculated (Patient's most recent lab result is older than the maximum 21 days allowed.).  COAG Lab Results  Component Value Date   INR 2.6 (H) 10/05/2022   INR 2.4 (H) 10/04/2022   INR 2.5 (H) 10/03/2022    Radiology DG Chest 2 View  Result Date: 11/02/2022 CLINICAL DATA:  Shortness of breath EXAM: CHEST - 2 VIEW COMPARISON:  Chest radiograph 09/27/2022 FINDINGS: Stable cardiac and mediastinal contours. No consolidative pulmonary opacities. No pleural effusion or pneumothorax. Thoracic spine degenerative changes. IMPRESSION: No active cardiopulmonary disease. Electronically Signed   By: Lovey Newcomer M.D.   On: 11/02/2022 22:58   PERIPHERAL VASCULAR  CATHETERIZATION  Result Date: 10/15/2022 See surgical note for result.  VAS [place] ABI WITH/WO TBI  Result Date: 10/11/2022  LOWER EXTREMITY DOPPLER STUDY Patient Name:  [name]  Date of Exam:   10/11/2022 Medical Rec #: [id number]      Accession #:    [id number] Date of Birth: [birth year]      Patient Gender: M Patient Age:   26 years [facility]am [facility] Procedure:      VAS [place] ABI WITH/WO TBI Referring Phys: Eulogio Ditch --------------------------------------------------------------------------------  Indications: Ulceration. High Risk Factors: Hypertension, Diabetes, past history of smoking. Other Factors: Cellulitis, lymphedema.  Vascular Interventions: Left arm dialysis access. Limitations: Today's exam was limited due to patient intolerant to cuff              pressure, an open wound and bandages. Performing Technologist: Delorise Shiner RVT  Examination Guidelines: A complete evaluation includes at minimum, Doppler waveform signals and systolic blood pressure reading at the level of bilateral brachial, anterior tibial, and posterior tibial arteries, when vessel segments are accessible. Bilateral testing is considered an integral part of a complete examination. Photoelectric Plethysmograph (PPG) waveforms and toe systolic pressure readings are included as required and additional duplex testing as needed. Limited examinations for reoccurring indications may be performed as noted.  ABI Findings: +---------+------------------+-----+----------+--------+ Right    Rt Pressure (mmHg)IndexWaveform  Comment  +---------+------------------+-----+----------+--------+ Brachial 196                                       +---------+------------------+-----+----------+--------+ ATA      255               1.30 monophasic         +---------+------------------+-----+----------+--------+ PTA      85                0.43 monophasic          +---------+------------------+-----+----------+--------+  Great [id number]               0.61                    +---------+------------------+-----+----------+--------+ +---------+------------------+-----+-------------------+---------------+ Left     Lt Pressure (mmHg)IndexWaveform           Comment         +---------+------------------+-----+-------------------+---------------+ Brachial                                           Dialysis access +---------+------------------+-----+-------------------+---------------+ ATA      255               1.30 dampened monophasic                +---------+------------------+-----+-------------------+---------------+ PTA      255               1.30 dampened monophasic                +---------+------------------+-----+-------------------+---------------+ Great Toe64                0.33                                    +---------+------------------+-----+-------------------+---------------+ +-------+----------------+-----------+------------+------------+ ABI/TBIToday's ABI     Today's TBIPrevious ABIPrevious TBI +-------+----------------+-----------+------------+------------+ Right  Non compressible0.61                                +-------+----------------+-----------+------------+------------+ Left   Non compressible0.33                                +-------+----------------+-----------+------------+------------+  Arterial wall calcification precludes accurate ankle pressures and ABIs.  Summary: Right: Resting right ankle-brachial index indicates noncompressible right lower extremity arteries. The right toe-brachial index is normal. Left: Resting left ankle-brachial index indicates noncompressible left lower extremity arteries. The left toe-brachial index is normal. *See table(s) above for measurements and observations.  Electronically signed by [name] MD on 10/11/2022 at 3:46:59 PM.    Final       Assessment/Plan There are no diagnoses linked to this encounter.   [name], MD  11/06/2022 2:36 PM

## 2022-11-06 NOTE — H&P (View-Only) (Signed)
MRN : [id number]  [name] is a 52 y.o. (10/19/1970) male who presents with chief complaint of legs hurt and swell.  History of Present Illness:  Patient is seen for follow up evaluation of leg pain and swelling associated with venous ulceration. The patient was recently admitted to the hospital secondary to cellulitis associated with this ulceration.  Since discharge there his been difficulty getting home health to help with wound care.  He does have to go to dialysis Tuesdays Thursdays and Saturdays.  He notes that the tissue within and surrounding the ulcer has turned black and dried but there is still a large amount of drainage from that wound overall.  The wound remains very painful.  There is a satellite wound on the dorsum of his foot.  He essentially has been doing his own wound care.  The patient states that they have been elevating as much as possible. The patient denies any recent changes in medications.  The patient denies a history of DVT or PE. There is no prior history of phlebitis. There is no history of primary lymphedema.  He does describe some increased SOB and increased cough.  Dr. [name] has started him on albuterol inhalers and he has been taking Zyrtec for the head congestion that he is experiencing.  He denies sputum production.  No recent episodes of CHF exacerbation.  No outpatient medications have been marked as taking for the 11/08/22 encounter (Appointment) with Delana Meyer, Dolores Lory, MD.    Past Medical History:  Diagnosis Date   Anemia    Chronic kidney disease    14% FUNCTION   Diabetes mellitus without complication ([place])    Diabetic retinopathy ([place])    Dyspnea    DOE   Dysrhythmia    End stage renal disease ([place])    History of orthopnea    error wrong patietn   Hypercholesteremia    error   Hyperkalemia    error wrong patient   Hypertension    Long-term insulin use ([place])    Lymphedema of leg    Metabolic encephalopathy    error    Microalbuminuria    MSSA (methicillin susceptible Staphylococcus aureus)    Obesity    PVD (peripheral vascular disease) (HCC)    Respiratory failure (HCC)    Error   Sepsis ([place])    error   Sleep apnea    no CPAP   UTI (urinary tract infection)    error   Venous ulcer ([place])    Vitamin D deficiency     Past Surgical History:  Procedure Laterality Date   A/V FISTULAGRAM Right 10/24/2018   Procedure: A/V FISTULAGRAM;  Surgeon: Katha Cabal, MD;  Location: [facility];  Service: Cardiovascular;  Laterality: Right;   A/V FISTULAGRAM Right 11/24/2018   Procedure: A/V FISTULAGRAM;  Surgeon: Katha Cabal, MD;  Location: [place] [facility];  Service: Cardiovascular;  Laterality: Right;   A/V FISTULAGRAM Left 07/31/2019   Procedure: A/V FISTULAGRAM;  Surgeon: Katha Cabal, MD;  Location: [facility];  Service: Cardiovascular;  Laterality: Left;   A/V FISTULAGRAM Left 12/11/2019   Procedure: A/V FISTULAGRAM;  Surgeon: Katha Cabal, MD;  Location: [place];  Service: Cardiovascular;  Laterality: Left;   A/V SHUNT INTERVENTION Right 02/21/2019   Procedure: A/V SHUNT INTERVENTION;  Surgeon: Katha Cabal, MD;  Location: [facility];  Service: Cardiovascular;  Laterality:  Right;   APPLICATION OF WOUND VAC Left 10/03/2017   Procedure: APPLICATION OF WOUND VAC;  Surgeon: Algernon Huxley, MD;  Location: [facility];  Service: General;  Laterality: Left;   APPLICATION OF WOUND VAC Left 10/11/2017   Procedure: APPLICATION OF WOUND VAC;  Surgeon: Katha Cabal, MD;  Location: [facility];  Service: Vascular;  Laterality: Left;   APPLICATION OF WOUND VAC Left 10/14/2017   Procedure: WOUND VAC CHANGE;  Surgeon: Katha Cabal, MD;  Location: [facility];  Service: Vascular;  Laterality: Left;  left lower leg   APPLICATION OF WOUND VAC Left 10/18/2017   Procedure: WOUND VAC CHANGE;  Surgeon: Katha Cabal, MD;  Location: [facility];  Service:  Vascular;  Laterality: Left;   APPLICATION OF WOUND VAC Left 10/07/2017   Procedure: APPLICATION OF WOUND VAC;  Surgeon: Katha Cabal, MD;  Location: [facility];  Service: Vascular;  Laterality: Left;   AV FISTULA INSERTION W/ RF MAGNETIC GUIDANCE Right 07/19/2018   Procedure: AV FISTULA INSERTION W/RF MAGNETIC GUIDANCE;  Surgeon: Katha Cabal, MD;  Location: [facility] [facility];  Service: Cardiovascular;  Laterality: Right;   AV FISTULA PLACEMENT Left 05/11/2019   Procedure: ARTERIOVENOUS (AV) FISTULA CREATION (RADIOCEPHALIC );  Surgeon: Katha Cabal, MD;  Location: [facility];  Service: Vascular;  Laterality: Left;   AV FISTULA PLACEMENT Left 02/20/2020   Procedure: INSERTION OF ARTERIOVENOUS (AV) GORE-TEX GRAFT LEFT ARM ( FOREARM LOOP );  Surgeon: Katha Cabal, MD;  Location: [facility];  Service: Vascular;  Laterality: Left;   CATARACT EXTRACTION W/PHACO Left 11/01/2018   Procedure: CATARACT EXTRACTION PHACO AND INTRAOCULAR LENS PLACEMENT (IOC)-LEFT, DIABETIC-INSULIN DEPENDENT;  Surgeon: Eulogio Bear, MD;  Location: [facility];  Service: Ophthalmology;  Laterality: Left;  [place] 00:41.8 CDE 4.61 Fluid Pack Lot # [id number]   CATARACT EXTRACTION W/PHACO Right 04/27/2019   Procedure: CATARACT EXTRACTION PHACO AND INTRAOCULAR LENS PLACEMENT (IOC);  Surgeon: Eulogio Bear, MD;  Location: [facility];  Service: Ophthalmology;  Laterality: Right;  [place] 00:34 CDE 1.97 FLUID PACK LOT # [id number]    COLONOSCOPY WITH PROPOFOL N/A 05/12/2020   Procedure: COLONOSCOPY WITH PROPOFOL;  Surgeon: [place], Benay Pike, MD;  Location: [facility];  Service: Gastroenterology;  Laterality: N/A;   DIALYSIS/PERMA CATHETER INSERTION N/A 02/23/2019   Procedure: DIALYSIS/PERMA CATHETER INSERTION;  Surgeon: Algernon Huxley, MD;  Location: [place] [facility];  Service: Cardiovascular;  Laterality: N/A;   DIALYSIS/PERMA CATHETER REMOVAL N/A 04/03/2020   Procedure: DIALYSIS/PERMA CATHETER REMOVAL;  Surgeon:  Katha Cabal, MD;  Location: [place] [facility];  Service: Cardiovascular;  Laterality: N/A;   DIALYSIS/PERMA CATHETER REMOVAL N/A 02/03/2022   Procedure: DIALYSIS/PERMA CATHETER REMOVAL;  Surgeon: Katha Cabal, MD;  Location: [place] [facility];  Service: Cardiovascular;  Laterality: N/A;   I & D EXTREMITY Left 10/11/2017   Procedure: IRRIGATION AND DEBRIDEMENT EXTREMITY;  Surgeon: Katha Cabal, MD;  Location: [facility];  Service: Vascular;  Laterality: Left;   INCISION AND DRAINAGE ABSCESS Right 10/07/2017   Procedure: INCISION AND DRAINAGE ABSCESS;  Surgeon: Katha Cabal, MD;  Location: [facility];  Service: Vascular;  Laterality: Right;   IRRIGATION AND DEBRIDEMENT ABSCESS Left 10/03/2017   Procedure: IRRIGATION AND DEBRIDEMENT ABSCESS with debridement of skin, soft tissue, muscle 50sq cm;  Surgeon: Algernon Huxley, MD;  Location: [facility];  Service: General;  Laterality: Left;   LOWER EXTREMITY ANGIOGRAPHY Left 10/15/2022   Procedure: Lower Extremity Angiography;  Surgeon: [name]  G, MD;  Location: [facility] [facility];  Service: Cardiovascular;  Laterality: Left;   TEMPORARY DIALYSIS CATHETER  02/20/2019   Procedure: TEMPORARY DIALYSIS CATHETER;  Surgeon: Katha Cabal, MD;  Location: [place] [facility];  Service: Cardiovascular;;   UPPER EXTREMITY ANGIOGRAPHY Left 09/18/2019   Procedure: UPPER EXTREMITY ANGIOGRAPHY;  Surgeon: Katha Cabal, MD;  Location: [place] [facility];  Service: Cardiovascular;  Laterality: Left;    Social History Social History   Tobacco Use   Smoking status: Former    Types: Cigars    Passive exposure: Past   Smokeless tobacco: Former    Quit date: 09/30/2017   Tobacco comments:    occasional  Vaping Use   Vaping Use: Never used  Substance Use Topics   Alcohol use: Yes    Alcohol/week: 1.0 standard drink of alcohol    Types: 1 Cans of beer per week    Comment: beer   Drug use: Not Currently    Family  History Family History  Problem Relation Age of Onset   Diabetes Father    Kidney disease Father    Diabetes Daughter     Allergies  Allergen Reactions   Lisinopril Swelling    Lips mouth    Furosemide Cough    Light headed, blurry vision, weakness.   Latex    Adhesive [Tape] Itching and Rash     REVIEW OF SYSTEMS (Negative unless checked)  Constitutional: '[]'$ Weight loss  '[]'$ Fever  '[]'$ Chills Cardiac: '[]'$ Chest pain   '[]'$ Chest pressure   '[]'$ Palpitations   '[]'$ Shortness of breath when laying flat   '[]'$ Shortness of breath with exertion. Vascular:  '[]'$ Pain in legs with walking   '[x]'$ Pain in legs at rest  '[]'$ History of DVT   '[]'$ Phlebitis   '[x]'$ Swelling in legs   '[]'$ Varicose veins   '[]'$ Non-healing ulcers Pulmonary:   '[]'$ Uses home oxygen   '[]'$ Productive cough   '[]'$ Hemoptysis   '[]'$ Wheeze  '[]'$ COPD   '[]'$ Asthma Neurologic:  '[]'$ Dizziness   '[]'$ Seizures   '[]'$ History of stroke   '[]'$ History of TIA  '[]'$ Aphasia   '[]'$ Vissual changes   '[]'$ Weakness or numbness in arm   '[]'$ Weakness or numbness in leg Musculoskeletal:   '[]'$ Joint swelling   '[]'$ Joint pain   '[]'$ Low back pain Hematologic:  '[]'$ Easy bruising  '[]'$ Easy bleeding   '[]'$ Hypercoagulable state   '[]'$ Anemic Gastrointestinal:  '[]'$ Diarrhea   '[]'$ Vomiting  '[]'$ Gastroesophageal reflux/heartburn   '[]'$ Difficulty swallowing. Genitourinary:  '[x]'$ Chronic kidney disease   '[]'$ Difficult urination  '[]'$ Frequent urination   '[]'$ Blood in urine Skin:  '[]'$ Rashes   '[x]'$ Ulcers  Psychological:  '[]'$ History of anxiety   '[]'$  History of major depression.  Physical Examination  There were no vitals filed for this visit. There is no height or weight on file to calculate BMI. Gen: WD/WN, NAD Head: [place]/AT, No temporalis wasting.  Ear/Nose/Throat: Hearing grossly intact, nares w/o erythema or drainage, pinna without lesions Eyes: PER, EOMI, sclera nonicteric.  Neck: Supple, no gross masses.  No JVD.  Pulmonary:  Good air movement, no audible wheezing, no use of accessory muscles.  Cardiac: RRR, precordium not  hyperdynamic. Vascular:  scattered varicosities present bilaterally.  Moderate venous stasis changes to the legs bilaterally.  3-4+ hard non-pitting edema. CEAP C6sEpAsPr ulcer in the medial calf is 20 cm x 20 cm and stellate in appearance.  There is a large amount of necrotic and gangrenous soft tissue within the ulcer.  There is a satellite lesion on the dorsum of the foot that is circular in nature with extensive necrotic tissue  and it.  The wounds are weeping a straw-colored lymphedema fluid Vessel Right Left  Radial Palpable Palpable  Gastrointestinal: soft, non-distended. No guarding/no peritoneal signs.  Musculoskeletal: M/S 5/5 throughout.  No deformity.  Neurologic: CN 2-12 intact. Pain and light touch intact in extremities.  Symmetrical.  Speech is fluent. Motor exam as listed above. Psychiatric: Judgment intact, Mood & affect appropriate for pt's clinical situation. Dermatologic: Venous rashes with ulcers noted, see above.  No changes consistent with cellulitis. Lymph : No lichenification or skin changes of chronic lymphedema.  CBC Lab Results  Component Value Date   WBC 14.5 (H) 10/05/2022   HGB 11.8 (L) 10/05/2022   HCT 34.8 (L) 10/05/2022   MCV 91.3 10/05/2022   PLT 318 10/05/2022    BMET    Component Value Date/Time   NA 132 (L) 10/05/2022 1040   K 5.3 (H) 10/05/2022 1040   CL 94 (L) 10/05/2022 1040   CO2 19 (L) 10/05/2022 1040   GLUCOSE 140 (H) 10/05/2022 1040   BUN 150 (H) 10/05/2022 1040   CREATININE 16.07 (H) 10/05/2022 1040   CALCIUM 7.8 (L) 10/05/2022 1040   GFRNONAA 3 (L) 10/05/2022 1040   GFRAA 9 (L) 02/19/2020 1316   CrCl cannot be calculated (Patient's most recent lab result is older than the maximum 21 days allowed.).  COAG Lab Results  Component Value Date   INR 2.6 (H) 10/05/2022   INR 2.4 (H) 10/04/2022   INR 2.5 (H) 10/03/2022    Radiology DG Chest 2 View  Result Date: 11/02/2022 CLINICAL DATA:  Shortness of breath EXAM: CHEST - 2 VIEW  COMPARISON:  Chest radiograph 09/27/2022 FINDINGS: Stable cardiac and mediastinal contours. No consolidative pulmonary opacities. No pleural effusion or pneumothorax. Thoracic spine degenerative changes. IMPRESSION: No active cardiopulmonary disease. Electronically Signed   By: Lovey Newcomer M.D.   On: 11/02/2022 22:58   PERIPHERAL VASCULAR CATHETERIZATION  Result Date: 10/15/2022 See surgical note for result.  VAS [place] ABI WITH/WO TBI  Result Date: 10/11/2022  LOWER EXTREMITY DOPPLER STUDY Patient Name:  [name]  Date of Exam:   10/11/2022 Medical Rec #: [id number]      Accession #:    [id number] Date of Birth: 11-24-1970      Patient Gender: M Patient Age:   89 years Exam Location:  [facility] Procedure:      VAS [place] ABI WITH/WO TBI Referring Phys: Eulogio Ditch --------------------------------------------------------------------------------  Indications: Ulceration. High Risk Factors: Hypertension, Diabetes, past history of smoking. Other Factors: Cellulitis, lymphedema.  Vascular Interventions: Left arm dialysis access. Limitations: Today's exam was limited due to patient intolerant to cuff              pressure, an open wound and bandages. Performing Technologist: Delorise Shiner RVT  Examination Guidelines: A complete evaluation includes at minimum, Doppler waveform signals and systolic blood pressure reading at the level of bilateral brachial, anterior tibial, and posterior tibial arteries, when vessel segments are accessible. Bilateral testing is considered an integral part of a complete examination. Photoelectric Plethysmograph (PPG) waveforms and toe systolic pressure readings are included as required and additional duplex testing as needed. Limited examinations for reoccurring indications may be performed as noted.  ABI Findings: +---------+------------------+-----+----------+--------+ Right    Rt Pressure (mmHg)IndexWaveform  Comment   +---------+------------------+-----+----------+--------+ Brachial 196                                       +---------+------------------+-----+----------+--------+  ATA      255               1.30 monophasic         +---------+------------------+-----+----------+--------+ PTA      85                0.43 monophasic         +---------+------------------+-----+----------+--------+ Great Toe119               0.61                    +---------+------------------+-----+----------+--------+ +---------+------------------+-----+-------------------+---------------+ Left     Lt Pressure (mmHg)IndexWaveform           Comment         +---------+------------------+-----+-------------------+---------------+ Brachial                                           Dialysis access +---------+------------------+-----+-------------------+---------------+ ATA      255               1.30 dampened monophasic                +---------+------------------+-----+-------------------+---------------+ PTA      255               1.30 dampened monophasic                +---------+------------------+-----+-------------------+---------------+ Great Toe64                0.33                                    +---------+------------------+-----+-------------------+---------------+ +-------+----------------+-----------+------------+------------+ ABI/TBIToday's ABI     Today's TBIPrevious ABIPrevious TBI +-------+----------------+-----------+------------+------------+ Right  Non compressible0.61                                +-------+----------------+-----------+------------+------------+ Left   Non compressible0.33                                +-------+----------------+-----------+------------+------------+  Arterial wall calcification precludes accurate ankle pressures and ABIs.  Summary: Right: Resting right ankle-brachial index indicates noncompressible right lower extremity  arteries. The right toe-brachial index is normal. Left: Resting left ankle-brachial index indicates noncompressible left lower extremity arteries. The left toe-brachial index is normal. *See table(s) above for measurements and observations.  Electronically signed by [name] MD on 10/11/2022 at 3:46:59 PM.    Final      Assessment/Plan 1. Venous ulcer of ankle, left ([place]) The patient's wound is not doing well.  He requires debridement in the operating room.  At this point I am thinking he will also require a VAC wound dressing.  I believe he also requires wound cultures and appropriate antibiotic therapy for the obvious necrotic tissue and infection.  I believe he will require hospitalization.  The risks and benefits as well as alternative therapies were discussed in detail.  All questions have been answered.  Patient agrees with this plan and is willing to undergo debridement in the operating room VAC dressing and parenteral antibiotics in the hospital.  2. Chronic venous insufficiency See above  3. Primary hypertension Continue antihypertensive medications as already ordered, these medications have been  reviewed and there are no changes at this time.  4. End stage renal disease ([place]) At the present time the patient has adequate dialysis access.  Continue hemodialysis as ordered without interruption.  Avoid nephrotoxic medications and dehydration.  Further plans per nephrology  5. Type 2 diabetes mellitus with hyperglycemia, with long-term current use of insulin (HCC) Continue hypoglycemic medications as already ordered, these medications have been reviewed and there are no changes at this time.  Hgb A1C to be monitored as already arranged by primary service    [name], MD  11/06/2022 2:36 PM

## 2022-11-08 ENCOUNTER — Encounter (INDEPENDENT_AMBULATORY_CARE_PROVIDER_SITE_OTHER): Payer: Self-pay | Admitting: Vascular Surgery

## 2022-11-08 ENCOUNTER — Ambulatory Visit (INDEPENDENT_AMBULATORY_CARE_PROVIDER_SITE_OTHER): Payer: Medicare HMO | Admitting: Vascular Surgery

## 2022-11-08 VITALS — BP 184/85 | HR 62 | Resp 18 | Wt 398.0 lb

## 2022-11-08 DIAGNOSIS — I10 Essential (primary) hypertension: Secondary | ICD-10-CM | POA: Diagnosis not present

## 2022-11-08 DIAGNOSIS — L97329 Non-pressure chronic ulcer of left ankle with unspecified severity: Secondary | ICD-10-CM

## 2022-11-08 DIAGNOSIS — E1169 Type 2 diabetes mellitus with other specified complication: Secondary | ICD-10-CM | POA: Diagnosis not present

## 2022-11-08 DIAGNOSIS — Z794 Long term (current) use of insulin: Secondary | ICD-10-CM

## 2022-11-08 DIAGNOSIS — E113599 Type 2 diabetes mellitus with proliferative diabetic retinopathy without macular edema, unspecified eye: Secondary | ICD-10-CM | POA: Diagnosis not present

## 2022-11-08 DIAGNOSIS — E1165 Type 2 diabetes mellitus with hyperglycemia: Secondary | ICD-10-CM | POA: Diagnosis not present

## 2022-11-08 DIAGNOSIS — E11622 Type 2 diabetes mellitus with other skin ulcer: Secondary | ICD-10-CM | POA: Diagnosis not present

## 2022-11-08 DIAGNOSIS — I83023 Varicose veins of left lower extremity with ulcer of ankle: Secondary | ICD-10-CM

## 2022-11-08 DIAGNOSIS — I872 Venous insufficiency (chronic) (peripheral): Secondary | ICD-10-CM

## 2022-11-08 DIAGNOSIS — N186 End stage renal disease: Secondary | ICD-10-CM

## 2022-11-08 DIAGNOSIS — E1122 Type 2 diabetes mellitus with diabetic chronic kidney disease: Secondary | ICD-10-CM | POA: Diagnosis not present

## 2022-11-08 DIAGNOSIS — E221 Hyperprolactinemia: Secondary | ICD-10-CM | POA: Diagnosis not present

## 2022-11-08 DIAGNOSIS — D352 Benign neoplasm of pituitary gland: Secondary | ICD-10-CM | POA: Diagnosis not present

## 2022-11-08 DIAGNOSIS — E1159 Type 2 diabetes mellitus with other circulatory complications: Secondary | ICD-10-CM | POA: Diagnosis not present

## 2022-11-09 ENCOUNTER — Other Ambulatory Visit (INDEPENDENT_AMBULATORY_CARE_PROVIDER_SITE_OTHER): Payer: Self-pay | Admitting: Nurse Practitioner

## 2022-11-09 DIAGNOSIS — N2581 Secondary hyperparathyroidism of renal origin: Secondary | ICD-10-CM | POA: Diagnosis not present

## 2022-11-09 DIAGNOSIS — N186 End stage renal disease: Secondary | ICD-10-CM | POA: Diagnosis not present

## 2022-11-09 DIAGNOSIS — L97329 Non-pressure chronic ulcer of left ankle with unspecified severity: Secondary | ICD-10-CM

## 2022-11-09 DIAGNOSIS — Z992 Dependence on renal dialysis: Secondary | ICD-10-CM | POA: Diagnosis not present

## 2022-11-09 MED ORDER — ORAL CARE MOUTH RINSE: 15.0000 mL | Freq: Once | OROMUCOSAL | Status: AC

## 2022-11-09 MED ORDER — SODIUM CHLORIDE 0.9 % IV SOLN: INTRAVENOUS | Status: DC

## 2022-11-09 MED ORDER — CHLORHEXIDINE GLUCONATE CLOTH 2 % EX PADS: 6.0000 | MEDICATED_PAD | Freq: Once | CUTANEOUS | Status: DC

## 2022-11-09 MED ORDER — CHLORHEXIDINE GLUCONATE 0.12 % MT SOLN: 15.0000 mL | Freq: Once | OROMUCOSAL | Status: AC

## 2022-11-09 MED ORDER — CEFAZOLIN SODIUM-DEXTROSE 2-4 GM/100ML-% IV SOLN
2.0000 g | INTRAVENOUS | Status: AC
  Administered 2022-11-10: 3 g via INTRAVENOUS

## 2022-11-10 ENCOUNTER — Other Ambulatory Visit: Payer: Self-pay

## 2022-11-10 ENCOUNTER — Encounter: Payer: Self-pay | Admitting: Vascular Surgery

## 2022-11-10 ENCOUNTER — Encounter
Admission: RE | Disposition: A | Discharge status: Home Health | Payer: Self-pay | Source: Home / Self Care | Attending: Vascular Surgery

## 2022-11-10 ENCOUNTER — Inpatient Hospital Stay: Payer: Medicare HMO | Admitting: Registered Nurse

## 2022-11-10 ENCOUNTER — Inpatient Hospital Stay
Admission: RE | Admit: 2022-11-10 | Discharge: 2022-11-25 | DRG: 264 | Disposition: A | Discharge status: Home Health | Payer: Medicare HMO | Attending: Vascular Surgery | Admitting: Vascular Surgery

## 2022-11-10 DIAGNOSIS — L97909 Non-pressure chronic ulcer of unspecified part of unspecified lower leg with unspecified severity: Secondary | ICD-10-CM | POA: Diagnosis not present

## 2022-11-10 DIAGNOSIS — I10 Essential (primary) hypertension: Secondary | ICD-10-CM | POA: Diagnosis not present

## 2022-11-10 DIAGNOSIS — Z888 Allergy status to other drugs, medicaments and biological substances status: Secondary | ICD-10-CM | POA: Diagnosis not present

## 2022-11-10 DIAGNOSIS — N186 End stage renal disease: Secondary | ICD-10-CM | POA: Diagnosis not present

## 2022-11-10 DIAGNOSIS — Z992 Dependence on renal dialysis: Secondary | ICD-10-CM

## 2022-11-10 DIAGNOSIS — E11622 Type 2 diabetes mellitus with other skin ulcer: Secondary | ICD-10-CM

## 2022-11-10 DIAGNOSIS — N2581 Secondary hyperparathyroidism of renal origin: Secondary | ICD-10-CM | POA: Diagnosis present

## 2022-11-10 DIAGNOSIS — E221 Hyperprolactinemia: Secondary | ICD-10-CM | POA: Diagnosis not present

## 2022-11-10 DIAGNOSIS — E1122 Type 2 diabetes mellitus with diabetic chronic kidney disease: Secondary | ICD-10-CM | POA: Diagnosis not present

## 2022-11-10 DIAGNOSIS — Z136 Encounter for screening for cardiovascular disorders: Secondary | ICD-10-CM | POA: Diagnosis not present

## 2022-11-10 DIAGNOSIS — E875 Hyperkalemia: Secondary | ICD-10-CM | POA: Diagnosis present

## 2022-11-10 DIAGNOSIS — E1165 Type 2 diabetes mellitus with hyperglycemia: Secondary | ICD-10-CM | POA: Diagnosis present

## 2022-11-10 DIAGNOSIS — I96 Gangrene, not elsewhere classified: Secondary | ICD-10-CM | POA: Diagnosis present

## 2022-11-10 DIAGNOSIS — E7800 Pure hypercholesterolemia, unspecified: Secondary | ICD-10-CM | POA: Diagnosis present

## 2022-11-10 DIAGNOSIS — L97523 Non-pressure chronic ulcer of other part of left foot with necrosis of muscle: Secondary | ICD-10-CM | POA: Diagnosis not present

## 2022-11-10 DIAGNOSIS — L97529 Non-pressure chronic ulcer of other part of left foot with unspecified severity: Secondary | ICD-10-CM | POA: Diagnosis present

## 2022-11-10 DIAGNOSIS — I872 Venous insufficiency (chronic) (peripheral): Secondary | ICD-10-CM | POA: Diagnosis present

## 2022-11-10 DIAGNOSIS — D631 Anemia in chronic kidney disease: Secondary | ICD-10-CM | POA: Diagnosis not present

## 2022-11-10 DIAGNOSIS — Z91040 Latex allergy status: Secondary | ICD-10-CM | POA: Diagnosis not present

## 2022-11-10 DIAGNOSIS — Z91048 Other nonmedicinal substance allergy status: Secondary | ICD-10-CM | POA: Diagnosis not present

## 2022-11-10 DIAGNOSIS — J4520 Mild intermittent asthma, uncomplicated: Secondary | ICD-10-CM | POA: Diagnosis not present

## 2022-11-10 DIAGNOSIS — E11319 Type 2 diabetes mellitus with unspecified diabetic retinopathy without macular edema: Secondary | ICD-10-CM | POA: Diagnosis not present

## 2022-11-10 DIAGNOSIS — Z87891 Personal history of nicotine dependence: Secondary | ICD-10-CM

## 2022-11-10 DIAGNOSIS — Z8616 Personal history of COVID-19: Secondary | ICD-10-CM

## 2022-11-10 DIAGNOSIS — Z01818 Encounter for other preprocedural examination: Principal | ICD-10-CM

## 2022-11-10 DIAGNOSIS — Z7902 Long term (current) use of antithrombotics/antiplatelets: Secondary | ICD-10-CM

## 2022-11-10 DIAGNOSIS — L97329 Non-pressure chronic ulcer of left ankle with unspecified severity: Secondary | ICD-10-CM | POA: Diagnosis present

## 2022-11-10 DIAGNOSIS — Z9889 Other specified postprocedural states: Secondary | ICD-10-CM | POA: Diagnosis not present

## 2022-11-10 DIAGNOSIS — E1152 Type 2 diabetes mellitus with diabetic peripheral angiopathy with gangrene: Principal | ICD-10-CM | POA: Diagnosis present

## 2022-11-10 DIAGNOSIS — Z794 Long term (current) use of insulin: Secondary | ICD-10-CM

## 2022-11-10 DIAGNOSIS — Z6841 Body Mass Index (BMI) 40.0 and over, adult: Secondary | ICD-10-CM | POA: Diagnosis not present

## 2022-11-10 DIAGNOSIS — L03116 Cellulitis of left lower limb: Secondary | ICD-10-CM | POA: Diagnosis not present

## 2022-11-10 DIAGNOSIS — E11621 Type 2 diabetes mellitus with foot ulcer: Secondary | ICD-10-CM | POA: Diagnosis present

## 2022-11-10 DIAGNOSIS — Z7901 Long term (current) use of anticoagulants: Secondary | ICD-10-CM | POA: Diagnosis not present

## 2022-11-10 DIAGNOSIS — J45909 Unspecified asthma, uncomplicated: Secondary | ICD-10-CM

## 2022-11-10 DIAGNOSIS — E11628 Type 2 diabetes mellitus with other skin complications: Secondary | ICD-10-CM | POA: Diagnosis not present

## 2022-11-10 DIAGNOSIS — I120 Hypertensive chronic kidney disease with stage 5 chronic kidney disease or end stage renal disease: Secondary | ICD-10-CM | POA: Diagnosis present

## 2022-11-10 DIAGNOSIS — G4733 Obstructive sleep apnea (adult) (pediatric): Secondary | ICD-10-CM | POA: Diagnosis present

## 2022-11-10 DIAGNOSIS — Z7982 Long term (current) use of aspirin: Secondary | ICD-10-CM

## 2022-11-10 DIAGNOSIS — E559 Vitamin D deficiency, unspecified: Secondary | ICD-10-CM | POA: Diagnosis present

## 2022-11-10 DIAGNOSIS — Z79899 Other long term (current) drug therapy: Secondary | ICD-10-CM

## 2022-11-10 DIAGNOSIS — I83023 Varicose veins of left lower extremity with ulcer of ankle: Secondary | ICD-10-CM

## 2022-11-10 DIAGNOSIS — D352 Benign neoplasm of pituitary gland: Secondary | ICD-10-CM

## 2022-11-10 DIAGNOSIS — I87332 Chronic venous hypertension (idiopathic) with ulcer and inflammation of left lower extremity: Secondary | ICD-10-CM | POA: Diagnosis not present

## 2022-11-10 DIAGNOSIS — E66813 Obesity, class 3: Secondary | ICD-10-CM | POA: Diagnosis present

## 2022-11-10 DIAGNOSIS — L97923 Non-pressure chronic ulcer of unspecified part of left lower leg with necrosis of muscle: Secondary | ICD-10-CM | POA: Diagnosis not present

## 2022-11-10 DIAGNOSIS — Z833 Family history of diabetes mellitus: Secondary | ICD-10-CM

## 2022-11-10 DIAGNOSIS — Z841 Family history of disorders of kidney and ureter: Secondary | ICD-10-CM

## 2022-11-10 HISTORY — PX: WOUND DEBRIDEMENT: SHX247

## 2022-11-10 HISTORY — PX: APPLICATION OF WOUND VAC: SHX5189

## 2022-11-10 LAB — CBC WITH DIFFERENTIAL/PLATELET
Abs Immature Granulocytes: 0.05 10*3/uL (ref 0.00–0.07)
Basophils Absolute: 0.1 10*3/uL (ref 0.0–0.1)
Basophils Relative: 1 %
Eosinophils Absolute: 0.2 10*3/uL (ref 0.0–0.5)
Eosinophils Relative: 3 %
HCT: 34 % — ABNORMAL LOW (ref 39.0–52.0)
Hemoglobin: 10.5 g/dL — ABNORMAL LOW (ref 13.0–17.0)
Immature Granulocytes: 1 %
Lymphocytes Relative: 20 %
Lymphs Abs: 1.3 10*3/uL (ref 0.7–4.0)
MCH: 30.8 pg (ref 26.0–34.0)
MCHC: 30.9 g/dL (ref 30.0–36.0)
MCV: 99.7 fL (ref 80.0–100.0)
Monocytes Absolute: 0.6 10*3/uL (ref 0.1–1.0)
Monocytes Relative: 9 %
Neutro Abs: 4.4 10*3/uL (ref 1.7–7.7)
Neutrophils Relative %: 66 %
Platelets: 191 10*3/uL (ref 150–400)
RBC: 3.41 MIL/uL — ABNORMAL LOW (ref 4.22–5.81)
RDW: 14.6 % (ref 11.5–15.5)
WBC: 6.6 10*3/uL (ref 4.0–10.5)
nRBC: 0 % (ref 0.0–0.2)

## 2022-11-10 LAB — PROTIME-INR
INR: 1 (ref 0.8–1.2)
Prothrombin Time: 13.6 seconds (ref 11.4–15.2)

## 2022-11-10 LAB — POCT I-STAT, CHEM 8
BUN: 38 mg/dL — ABNORMAL HIGH (ref 6–20)
Calcium, Ion: 1.14 mmol/L — ABNORMAL LOW (ref 1.15–1.40)
Chloride: 102 mmol/L (ref 98–111)
Creatinine, Ser: 10.7 mg/dL — ABNORMAL HIGH (ref 0.61–1.24)
Glucose, Bld: 110 mg/dL — ABNORMAL HIGH (ref 70–99)
HCT: 34 % — ABNORMAL LOW (ref 39.0–52.0)
Hemoglobin: 11.6 g/dL — ABNORMAL LOW (ref 13.0–17.0)
Potassium: 4.9 mmol/L (ref 3.5–5.1)
Sodium: 140 mmol/L (ref 135–145)
TCO2: 30 mmol/L (ref 22–32)

## 2022-11-10 LAB — BASIC METABOLIC PANEL
Anion gap: 10 (ref 5–15)
BUN: 42 mg/dL — ABNORMAL HIGH (ref 6–20)
CO2: 26 mmol/L (ref 22–32)
Calcium: 9.4 mg/dL (ref 8.9–10.3)
Chloride: 101 mmol/L (ref 98–111)
Creatinine, Ser: 8.92 mg/dL — ABNORMAL HIGH (ref 0.61–1.24)
GFR, Estimated: 7 mL/min — ABNORMAL LOW (ref >60)
Glucose, Bld: 116 mg/dL — ABNORMAL HIGH (ref 70–99)
Potassium: 4.8 mmol/L (ref 3.5–5.1)
Sodium: 137 mmol/L (ref 135–145)

## 2022-11-10 LAB — TYPE AND SCREEN
ABO/RH(D): A POS
Antibody Screen: NEGATIVE

## 2022-11-10 LAB — GLUCOSE, CAPILLARY
Glucose-Capillary: 101 mg/dL — ABNORMAL HIGH (ref 70–99)
Glucose-Capillary: 135 mg/dL — ABNORMAL HIGH (ref 70–99)
Glucose-Capillary: 201 mg/dL — ABNORMAL HIGH (ref 70–99)

## 2022-11-10 SURGERY — DEBRIDEMENT, WOUND
Anesthesia: General | Site: Leg Lower | Laterality: Left

## 2022-11-10 MED ORDER — ALBUTEROL SULFATE HFA 108 (90 BASE) MCG/ACT IN AERS
INHALATION_SPRAY | RESPIRATORY_TRACT | Status: DC | PRN
  Administered 2022-11-10: 4 via RESPIRATORY_TRACT

## 2022-11-10 MED ORDER — ATORVASTATIN CALCIUM 20 MG PO TABS
ORAL_TABLET | ORAL | Status: AC
  Filled 2022-11-10: qty 1

## 2022-11-10 MED ORDER — LIDOCAINE HCL (PF) 1 % IJ SOLN: 5.0000 mL | INTRAMUSCULAR | Status: DC | PRN

## 2022-11-10 MED ORDER — ROCURONIUM BROMIDE 100 MG/10ML IV SOLN
INTRAVENOUS | Status: DC | PRN
  Administered 2022-11-10: 100 mg via INTRAVENOUS

## 2022-11-10 MED ORDER — ACETAMINOPHEN 10 MG/ML IV SOLN
INTRAVENOUS | Status: DC | PRN
  Administered 2022-11-10: 1000 mg via INTRAVENOUS

## 2022-11-10 MED ORDER — ALBUTEROL SULFATE (2.5 MG/3ML) 0.083% IN NEBU
INHALATION_SOLUTION | RESPIRATORY_TRACT | Status: AC
  Administered 2022-11-10: 2.5 mg via RESPIRATORY_TRACT
  Filled 2022-11-10: qty 3

## 2022-11-10 MED ORDER — ONDANSETRON HCL 4 MG/2ML IJ SOLN: 4.0000 mg | Freq: Four times a day (QID) | INTRAMUSCULAR | Status: DC | PRN

## 2022-11-10 MED ORDER — NEOSTIGMINE METHYLSULFATE 10 MG/10ML IV SOLN
INTRAVENOUS | Status: DC | PRN
  Administered 2022-11-10: 5 mg via INTRAVENOUS

## 2022-11-10 MED ORDER — LIDOCAINE-PRILOCAINE 2.5-2.5 % EX CREA: 1.0000 | TOPICAL_CREAM | CUTANEOUS | Status: DC | PRN

## 2022-11-10 MED ORDER — MELATONIN 5 MG PO TABS: 5.0000 mg | ORAL_TABLET | Freq: Every evening | ORAL | Status: DC | PRN

## 2022-11-10 MED ORDER — CHLORHEXIDINE GLUCONATE 0.12 % MT SOLN
OROMUCOSAL | Status: AC
  Administered 2022-11-10: 15 mL via OROMUCOSAL
  Filled 2022-11-10: qty 15

## 2022-11-10 MED ORDER — ATENOLOL 25 MG PO TABS: 25.0000 mg | ORAL_TABLET | Freq: Every day | ORAL | Status: DC | PRN

## 2022-11-10 MED ORDER — BROMOCRIPTINE MESYLATE 2.5 MG PO TABS
2.5000 mg | ORAL_TABLET | Freq: Every day | ORAL | Status: DC
  Administered 2022-11-11 – 2022-11-13 (×3): 2.5 mg via ORAL
  Filled 2022-11-10 (×3): qty 1

## 2022-11-10 MED ORDER — ANTICOAGULANT SODIUM CITRATE 4% (200MG/5ML) IV SOLN: 5.0000 mL | Status: DC | PRN

## 2022-11-10 MED ORDER — GUAIFENESIN ER 600 MG PO TB12
600.0000 mg | ORAL_TABLET | Freq: Two times a day (BID) | ORAL | Status: DC
  Administered 2022-11-10 – 2022-11-15 (×10): 600 mg via ORAL
  Filled 2022-11-10 (×14): qty 1

## 2022-11-10 MED ORDER — FENTANYL CITRATE (PF) 100 MCG/2ML IJ SOLN
INTRAMUSCULAR | Status: DC | PRN
  Administered 2022-11-10: 100 ug via INTRAVENOUS

## 2022-11-10 MED ORDER — PROPOFOL 10 MG/ML IV BOLUS
INTRAVENOUS | Status: AC
  Filled 2022-11-10: qty 20

## 2022-11-10 MED ORDER — LORATADINE 10 MG PO TABS: 10.0000 mg | ORAL_TABLET | Freq: Every day | ORAL | Status: DC | PRN

## 2022-11-10 MED ORDER — FENTANYL CITRATE (PF) 100 MCG/2ML IJ SOLN
25.0000 ug | INTRAMUSCULAR | Status: DC | PRN
  Administered 2022-11-10: 50 ug via INTRAVENOUS
  Administered 2022-11-10 (×2): 25 ug via INTRAVENOUS

## 2022-11-10 MED ORDER — SODIUM CHLORIDE (PF) 0.9 % IJ SOLN
INTRAMUSCULAR | Status: AC
  Filled 2022-11-10: qty 10

## 2022-11-10 MED ORDER — ALBUTEROL SULFATE (2.5 MG/3ML) 0.083% IN NEBU: 2.5000 mg | INHALATION_SOLUTION | Freq: Once | RESPIRATORY_TRACT | Status: AC

## 2022-11-10 MED ORDER — PENTAFLUOROPROP-TETRAFLUOROETH EX AERO: 1.0000 | INHALATION_SPRAY | CUTANEOUS | Status: DC | PRN

## 2022-11-10 MED ORDER — TRAZODONE HCL 50 MG PO TABS
50.0000 mg | ORAL_TABLET | Freq: Every day | ORAL | Status: DC
  Administered 2022-11-10 – 2022-11-24 (×15): 50 mg via ORAL
  Filled 2022-11-10 (×15): qty 1

## 2022-11-10 MED ORDER — ATORVASTATIN CALCIUM 10 MG PO TABS
10.0000 mg | ORAL_TABLET | Freq: Every day | ORAL | Status: DC
  Administered 2022-11-10 – 2022-11-25 (×15): 10 mg via ORAL
  Filled 2022-11-10 (×14): qty 1

## 2022-11-10 MED ORDER — CLOPIDOGREL BISULFATE 75 MG PO TABS
ORAL_TABLET | ORAL | Status: AC
  Filled 2022-11-10: qty 1

## 2022-11-10 MED ORDER — ALTEPLASE 2 MG IJ SOLR: 2.0000 mg | Freq: Once | INTRAMUSCULAR | Status: DC | PRN

## 2022-11-10 MED ORDER — FENTANYL CITRATE (PF) 100 MCG/2ML IJ SOLN
INTRAMUSCULAR | Status: AC
  Filled 2022-11-10: qty 2

## 2022-11-10 MED ORDER — OXYCODONE HCL 5 MG/5ML PO SOLN: 5.0000 mg | Freq: Once | ORAL | Status: AC | PRN

## 2022-11-10 MED ORDER — CALCIUM ACETATE (PHOS BINDER) 667 MG PO CAPS: 1334.0000 mg | ORAL_CAPSULE | ORAL | Status: DC

## 2022-11-10 MED ORDER — OXYCODONE-ACETAMINOPHEN 5-325 MG PO TABS
1.0000 | ORAL_TABLET | Freq: Four times a day (QID) | ORAL | Status: DC | PRN
  Administered 2022-11-10 – 2022-11-22 (×7): 2 via ORAL
  Filled 2022-11-10 (×8): qty 2

## 2022-11-10 MED ORDER — ONDANSETRON HCL 4 MG/2ML IJ SOLN
INTRAMUSCULAR | Status: DC | PRN
  Administered 2022-11-10: 4 mg via INTRAVENOUS

## 2022-11-10 MED ORDER — ALBUTEROL SULFATE HFA 108 (90 BASE) MCG/ACT IN AERS
INHALATION_SPRAY | RESPIRATORY_TRACT | Status: AC
  Filled 2022-11-10: qty 6.7

## 2022-11-10 MED ORDER — ACETAMINOPHEN 10 MG/ML IV SOLN
INTRAVENOUS | Status: AC
  Filled 2022-11-10: qty 100

## 2022-11-10 MED ORDER — LIDOCAINE HCL (CARDIAC) PF 100 MG/5ML IV SOSY
PREFILLED_SYRINGE | INTRAVENOUS | Status: DC | PRN
  Administered 2022-11-10: 100 mg via INTRAVENOUS

## 2022-11-10 MED ORDER — CINACALCET HCL 30 MG PO TABS
30.0000 mg | ORAL_TABLET | Freq: Every day | ORAL | Status: DC
  Administered 2022-11-11 – 2022-11-25 (×15): 30 mg via ORAL
  Filled 2022-11-10 (×15): qty 1

## 2022-11-10 MED ORDER — ALBUTEROL SULFATE (2.5 MG/3ML) 0.083% IN NEBU: 3.0000 mL | INHALATION_SOLUTION | RESPIRATORY_TRACT | Status: DC | PRN

## 2022-11-10 MED ORDER — HYDROMORPHONE HCL 1 MG/ML IJ SOLN: 1.0000 mg | Freq: Once | INTRAMUSCULAR | Status: DC | PRN

## 2022-11-10 MED ORDER — ALBUTEROL SULFATE (2.5 MG/3ML) 0.083% IN NEBU: 3.0000 mL | INHALATION_SOLUTION | Freq: Four times a day (QID) | RESPIRATORY_TRACT | Status: DC | PRN

## 2022-11-10 MED ORDER — PROPOFOL 10 MG/ML IV BOLUS
INTRAVENOUS | Status: DC | PRN
  Administered 2022-11-10: 50 mg via INTRAVENOUS
  Administered 2022-11-10: 200 mg via INTRAVENOUS

## 2022-11-10 MED ORDER — ALLOPURINOL 100 MG PO TABS
100.0000 mg | ORAL_TABLET | Freq: Every day | ORAL | Status: DC
  Administered 2022-11-11 – 2022-11-25 (×15): 100 mg via ORAL
  Filled 2022-11-10 (×15): qty 1

## 2022-11-10 MED ORDER — ONDANSETRON HCL 4 MG/2ML IJ SOLN
INTRAMUSCULAR | Status: AC
  Filled 2022-11-10: qty 2

## 2022-11-10 MED ORDER — PHENYLEPHRINE HCL-NACL 20-0.9 MG/250ML-% IV SOLN
INTRAVENOUS | Status: DC | PRN
  Administered 2022-11-10: 40 ug/min via INTRAVENOUS

## 2022-11-10 MED ORDER — 0.9 % SODIUM CHLORIDE (POUR BTL) OPTIME
TOPICAL | Status: DC | PRN
  Administered 2022-11-10: 1000 mL

## 2022-11-10 MED ORDER — CEFAZOLIN SODIUM-DEXTROSE 2-4 GM/100ML-% IV SOLN
INTRAVENOUS | Status: AC
  Filled 2022-11-10: qty 100

## 2022-11-10 MED ORDER — HEPARIN SODIUM (PORCINE) 5000 UNIT/ML IJ SOLN
INTRAMUSCULAR | Status: AC
  Filled 2022-11-10: qty 1

## 2022-11-10 MED ORDER — INSULIN ASPART 100 UNIT/ML IJ SOLN: 0.0000 [IU] | Freq: Three times a day (TID) | INTRAMUSCULAR | Status: DC

## 2022-11-10 MED ORDER — CALCIUM ACETATE (PHOS BINDER) 667 MG PO CAPS
2001.0000 mg | ORAL_CAPSULE | Freq: Three times a day (TID) | ORAL | Status: DC
  Administered 2022-11-11 – 2022-11-25 (×28): 2001 mg via ORAL
  Filled 2022-11-10 (×24): qty 3

## 2022-11-10 MED ORDER — BENZONATATE 100 MG PO CAPS
200.0000 mg | ORAL_CAPSULE | Freq: Three times a day (TID) | ORAL | Status: DC | PRN
  Administered 2022-11-12 – 2022-11-14 (×4): 200 mg via ORAL
  Filled 2022-11-10 (×4): qty 2

## 2022-11-10 MED ORDER — GLYCOPYRROLATE 0.2 MG/ML IJ SOLN
INTRAMUSCULAR | Status: DC | PRN
  Administered 2022-11-10: .8 mg via INTRAVENOUS

## 2022-11-10 MED ORDER — OXYCODONE HCL 5 MG PO TABS
5.0000 mg | ORAL_TABLET | Freq: Once | ORAL | Status: AC | PRN
  Administered 2022-11-10: 5 mg via ORAL

## 2022-11-10 MED ORDER — HEPARIN SODIUM (PORCINE) 1000 UNIT/ML DIALYSIS: 1000.0000 [IU] | INTRAMUSCULAR | Status: DC | PRN

## 2022-11-10 MED ORDER — PHENYLEPHRINE HCL (PRESSORS) 10 MG/ML IV SOLN
INTRAVENOUS | Status: DC | PRN
  Administered 2022-11-10 (×3): 160 ug via INTRAVENOUS

## 2022-11-10 MED ORDER — HEPARIN SODIUM (PORCINE) 5000 UNIT/ML IJ SOLN
5000.0000 [IU] | Freq: Three times a day (TID) | INTRAMUSCULAR | Status: DC
  Administered 2022-11-10 – 2022-11-11 (×5): 5000 [IU] via SUBCUTANEOUS
  Filled 2022-11-10 (×5): qty 1

## 2022-11-10 MED ORDER — CLOPIDOGREL BISULFATE 75 MG PO TABS
75.0000 mg | ORAL_TABLET | Freq: Every day | ORAL | Status: DC
  Administered 2022-11-10 – 2022-11-25 (×14): 75 mg via ORAL
  Filled 2022-11-10 (×13): qty 1

## 2022-11-10 MED ORDER — INSULIN GLARGINE-YFGN 100 UNIT/ML ~~LOC~~ SOLN
15.0000 [IU] | Freq: Every day | SUBCUTANEOUS | Status: DC
  Filled 2022-11-10 (×2): qty 0.15

## 2022-11-10 MED ORDER — INSULIN GLARGINE-YFGN 100 UNIT/ML ~~LOC~~ SOLN
10.0000 [IU] | Freq: Every day | SUBCUTANEOUS | Status: DC
  Administered 2022-11-10 – 2022-11-24 (×15): 10 [IU] via SUBCUTANEOUS
  Filled 2022-11-10 (×17): qty 0.1

## 2022-11-10 MED ORDER — CEFAZOLIN SODIUM 1 G IJ SOLR
INTRAMUSCULAR | Status: AC
  Filled 2022-11-10: qty 10

## 2022-11-10 MED ORDER — CHLORHEXIDINE GLUCONATE CLOTH 2 % EX PADS
6.0000 | MEDICATED_PAD | Freq: Every day | CUTANEOUS | Status: DC
  Administered 2022-11-11 – 2022-11-25 (×13): 6 via TOPICAL

## 2022-11-10 MED ORDER — AMLODIPINE BESYLATE 5 MG PO TABS: 5.0000 mg | ORAL_TABLET | Freq: Every day | ORAL | Status: DC | PRN

## 2022-11-10 MED ORDER — OXYCODONE HCL 5 MG PO TABS
ORAL_TABLET | ORAL | Status: AC
  Filled 2022-11-10: qty 1

## 2022-11-10 MED ORDER — CALCIUM ACETATE (PHOS BINDER) 667 MG PO CAPS
1334.0000 mg | ORAL_CAPSULE | Freq: Two times a day (BID) | ORAL | Status: DC | PRN
  Administered 2022-11-10: 1334 mg via ORAL

## 2022-11-10 SURGICAL SUPPLY — 38 items
AGENT HMST PWDR BTL CLGN 5GM (Miscellaneous) ×2 IMPLANT
APL PRP STRL LF DISP 70% ISPRP (MISCELLANEOUS) ×1
BNDG CMPR 5X6 CHSV STRCH STRL (GAUZE/BANDAGES/DRESSINGS) ×1
BNDG CMPR 75X21 PLY HI ABS (MISCELLANEOUS)
BNDG COHESIVE 6X5 TAN ST LF (GAUZE/BANDAGES/DRESSINGS) ×1 IMPLANT
CANISTER WOUND CARE 500ML ATS (WOUND CARE) IMPLANT
CHLORAPREP W/TINT 26 (MISCELLANEOUS) ×1 IMPLANT
COLLAGEN CELLERATERX 5 GRAM (Miscellaneous) IMPLANT
CONNECTOR Y WND VAC (MISCELLANEOUS) IMPLANT
DRAPE EXTREMITY 106X87X128.5 (DRAPES) IMPLANT
DRAPE INCISE IOBAN 66X45 STRL (DRAPES) IMPLANT
DRSG ADAPTIC 3X8 NADH LF (GAUZE/BANDAGES/DRESSINGS) IMPLANT
DRSG EMULSION OIL 3X3 NADH (GAUZE/BANDAGES/DRESSINGS) IMPLANT
DRSG VAC GRANUFOAM LG (GAUZE/BANDAGES/DRESSINGS) IMPLANT
DRSG VAC GRANUFOAM MED (GAUZE/BANDAGES/DRESSINGS) IMPLANT
ELECT REM PT RETURN 9FT ADLT (ELECTROSURGICAL) ×1
ELECTRODE REM PT RTRN 9FT ADLT (ELECTROSURGICAL) ×1 IMPLANT
GAUZE SPONGE 4X4 12PLY STRL (GAUZE/BANDAGES/DRESSINGS) IMPLANT
GAUZE STRETCH 2X75IN STRL (MISCELLANEOUS) IMPLANT
GLOVE BIO SURGEON STRL SZ7 (GLOVE) ×1 IMPLANT
GLOVE SURG SYN 8.0 (GLOVE) ×1 IMPLANT
GLOVE SURG SYN 8.0 PF PI (GLOVE) ×1 IMPLANT
GOWN STRL REUS W/ TWL LRG LVL3 (GOWN DISPOSABLE) ×2 IMPLANT
GOWN STRL REUS W/ TWL XL LVL3 (GOWN DISPOSABLE) ×1 IMPLANT
GOWN STRL REUS W/TWL LRG LVL3 (GOWN DISPOSABLE) ×2
GOWN STRL REUS W/TWL XL LVL3 (GOWN DISPOSABLE) ×1
KIT TURNOVER KIT A (KITS) ×1 IMPLANT
LABEL OR SOLS (LABEL) ×1 IMPLANT
MANIFOLD NEPTUNE II (INSTRUMENTS) ×1 IMPLANT
NS IRRIG 500ML POUR BTL (IV SOLUTION) ×1 IMPLANT
PACK EXTREMITY ARMC (MISCELLANEOUS) ×1 IMPLANT
PAD NEG PRESSURE SENSATRAC (MISCELLANEOUS) IMPLANT
PAD PREP 24X41 OB/GYN DISP (PERSONAL CARE ITEMS) ×1 IMPLANT
SOL PREP PVP 2OZ (MISCELLANEOUS) ×1
SOLUTION PREP PVP 2OZ (MISCELLANEOUS) ×1 IMPLANT
STOCKINETTE IMPERV 14X48 (MISCELLANEOUS) ×1 IMPLANT
TRAP FLUID SMOKE EVACUATOR (MISCELLANEOUS) ×1 IMPLANT
WATER STERILE IRR 500ML POUR (IV SOLUTION) ×1 IMPLANT

## 2022-11-10 NOTE — Assessment & Plan Note (Addendum)
A1c last checked in January 2024, elevated at 8.6 %.  Patient has recently established with endocrinology.  He is currently on Lantus 18 units at bedtime.  Dexcom has been placed as well, with patient stating it has been helpful.  - Continue Lantus 10 units at bedtime - SSI, moderate

## 2022-11-10 NOTE — Op Note (Signed)
    OPERATIVE NOTE   PROCEDURE: 1.  Excisional debridement of 2 left lower extremity venous ulcers. 2.  Placement of a VAC wound dressing to both venous ulcers  PRE-OPERATIVE DIAGNOSIS: Ulceration of the left lower extremity associated with gangrenous tissue and surrounding cellulitis  POST-OPERATIVE DIAGNOSIS: Same  SURGEON: [name]  ASSISTANT(S): None  ANESTHESIA: general  ESTIMATED BLOOD LOSS: 20 cc  FINDING(S): Necrotic tissue in both ulcers  SPECIMEN(S): Deep wound cultures are obtained from both the ankle and the foot ulcers.  The debrided tissue is not sent for specimen  INDICATIONS:   [name] is a 52 y.o. male who presents with 2 large necrotic ulcers of the left lower extremity associated with severe venous stasis dermatitis and venous insufficiency.  The patient was noted to have increasing pain as well as a substantial increase in drainage in association with foul odor.  Given necrotic tissue debridement is indicated.  Risks and benefits were reviewed all questions were answered patient has agreed to proceed.  DESCRIPTION: After full informed written consent was obtained from the patient, the patient was brought back to the operating room and placed supine upon the operating table.  Prior to induction, the patient received IV antibiotics.   After obtaining adequate anesthesia, the patient was then prepped and draped in the standard fashion for a debridement.  Using a 15 blade scalpel curved Mayo scissors and Bovie cautery the liquefying and necrotic tissue is debrided.  There is a fair amount of undermining in certain areas and full-thickness skin debridement was also performed as needed.  The tissues debrided were skin and subcutaneous tissues.  Muscle and fascia were intact.  The ankle was addressed first and then the dorsal foot ulcer was treated in similar fashion.  Once the necrotic tissue had been debrided deep wound cultures were taken of both the  ankle and all foot ulcer and sent to microbiology separately.  The wounds were then irrigated with a liter of saline and hemostasis was obtained with Bovie Bovie cautery.  The ankle wound is stellate in appearance and measures 25 cm in length by 12 cm in width, 300 cm.  The dorsal foot wound measures 8 cm x 6 cm, 48 cm  Cellerate was then placed in the bed of both wounds with coverage of approximately 2 mm.  A black VAC sponge was then trimmed to the appropriate shape for both the ankle and then the dorsal foot wound.  The dressing was sealed with Ioban.  Prior to leaving the operating room the vacs were connected with a Y adapter both wounds had good seals, no leaks.  This is a hospital VAC not a disposable VAC.   The patient tolerated this procedure well.   COMPLICATIONS: None  CONDITION: [name]  Office: [phone]   11/10/2022, 4:54 PM

## 2022-11-10 NOTE — Anesthesia Preprocedure Evaluation (Signed)
Anesthesia Evaluation  Patient identified by MRN, date of birth, ID band Patient awake    Reviewed: Allergy & Precautions, NPO status , Patient's Chart, lab work & pertinent test results  History of Anesthesia Complications Negative for: history of anesthetic complications  Airway Mallampati: III  TM Distance: >3 FB Neck ROM: full    Dental  (+) Poor Dentition   Pulmonary shortness of breath, sleep apnea , former smoker   Pulmonary exam normal        Cardiovascular hypertension, On Medications + Peripheral Vascular Disease  Normal cardiovascular exam     Neuro/Psych negative neurological ROS  negative psych ROS   GI/Hepatic negative GI ROS, Neg liver ROS,,,  Endo/Other  negative endocrine ROSdiabetes    Renal/GU ESRF and DialysisRenal disease  negative genitourinary   Musculoskeletal   Abdominal   Peds  Hematology  (+) Blood dyscrasia, anemia   Anesthesia Other Findings Past Medical History: No date: Anemia No date: Chronic kidney disease     Comment:  14% FUNCTION No date: Diabetes mellitus without complication (HCC) No date: Diabetic retinopathy (HCC) No date: Dyspnea     Comment:  DOE No date: Dysrhythmia No date: End stage renal disease (HCC) No date: History of orthopnea     Comment:  error wrong patietn No date: Hypercholesteremia     Comment:  error No date: Hyperkalemia     Comment:  error wrong patient No date: Hypertension No date: Long-term insulin use (HCC) No date: Lymphedema of leg No date: Metabolic encephalopathy     Comment:  error No date: Microalbuminuria No date: MSSA (methicillin susceptible Staphylococcus aureus) No date: Obesity No date: PVD (peripheral vascular disease) (HCC) No date: Respiratory failure (HCC)     Comment:  Error No date: Sepsis ([place])     Comment:  error No date: Sleep apnea     Comment:  no CPAP No date: UTI (urinary tract infection)     Comment:   error No date: Venous ulcer ([place]) No date: Vitamin D deficiency  Past Surgical History: 10/24/2018: A/V FISTULAGRAM; Right     Comment:  Procedure: A/V FISTULAGRAM;  Surgeon: Katha Cabal, MD;  Location: [facility];  Service:               Cardiovascular;  Laterality: Right; 11/24/2018: A/V FISTULAGRAM; Right     Comment:  Procedure: A/V FISTULAGRAM;  Surgeon: Katha Cabal, MD;  Location: [place] [facility];  Service:               Cardiovascular;  Laterality: Right; 07/31/2019: A/V FISTULAGRAM; Left     Comment:  Procedure: A/V FISTULAGRAM;  Surgeon: Katha Cabal, MD;  Location: [place] [facility];  Service:               Cardiovascular;  Laterality: Left; 12/11/2019: A/V FISTULAGRAM; Left     Comment:  Procedure: A/V FISTULAGRAM;  Surgeon: Katha Cabal, MD;  Location: [place];  Service:               Cardiovascular;  Laterality: Left; 02/21/2019: A/V SHUNT INTERVENTION; Right     Comment:  Procedure:  A/V SHUNT INTERVENTION;  Surgeon: Katha Cabal, MD;  Location: [facility];  Service:              Cardiovascular;  Laterality: Right; 0000000: APPLICATION OF WOUND VAC; Left     Comment:  Procedure: APPLICATION OF WOUND VAC;  Surgeon: Algernon Huxley, MD;  Location: [facility];  Service: General;                Laterality: Left; [id number]: APPLICATION OF WOUND VAC; Left     Comment:  Procedure: APPLICATION OF WOUND VAC;  Surgeon: Katha Cabal, MD;  Location: [facility];  Service: Vascular;                Laterality: Left; [id number]: APPLICATION OF WOUND VAC; Left     Comment:  Procedure: WOUND VAC CHANGE;  Surgeon: Katha Cabal, MD;  Location: [facility];  Service: Vascular;                Laterality: Left;  left lower leg [id number]: APPLICATION OF WOUND VAC; Left     Comment:  Procedure: WOUND VAC CHANGE;   Surgeon: Katha Cabal, MD;  Location: [facility];  Service: Vascular;                Laterality: Left; [id number]: APPLICATION OF WOUND VAC; Left     Comment:  Procedure: APPLICATION OF WOUND VAC;  Surgeon: Katha Cabal, MD;  Location: [facility];  Service: Vascular;                Laterality: Left; 07/19/2018: AV FISTULA INSERTION W/ RF MAGNETIC GUIDANCE; Right     Comment:  Procedure: AV FISTULA INSERTION W/RF MAGNETIC GUIDANCE;               Surgeon: Katha Cabal, MD;  Location: [place]              [facility];  Service: Cardiovascular;  Laterality: Right; 05/11/2019: AV FISTULA PLACEMENT; Left     Comment:  Procedure: ARTERIOVENOUS (AV) FISTULA CREATION               (RADIOCEPHALIC );  Surgeon: Katha Cabal, MD;                Location: [facility];  Service: Vascular;  Laterality:               Left; 02/20/2020: AV FISTULA PLACEMENT; Left     Comment:  Procedure: INSERTION OF ARTERIOVENOUS (AV) GORE-TEX               GRAFT LEFT ARM ( FOREARM LOOP );  Surgeon: Katha Cabal, MD;  Location: [facility];  Service: Vascular;                Laterality: Left; 11/01/2018: CATARACT EXTRACTION W/PHACO; Left     Comment:  Procedure: CATARACT EXTRACTION PHACO AND INTRAOCULAR  LENS PLACEMENT (IOC)-LEFT, DIABETIC-INSULIN DEPENDENT;                Surgeon: Eulogio Bear, MD;  Location: [facility];                Service: Ophthalmology;  Laterality: Left;  [place]               00:41.8 CDE 4.61 Fluid Pack Lot # [id number] 04/27/2019: CATARACT EXTRACTION W/PHACO; Right     Comment:  Procedure: CATARACT EXTRACTION PHACO AND INTRAOCULAR               LENS PLACEMENT (IOC);  Surgeon: Eulogio Bear, MD;                Location: [facility];  Service: Ophthalmology;  Laterality:              Right;  [place] 00:34 CDE 1.97 FLUID PACK LOT # [id number]  05/12/2020: COLONOSCOPY WITH PROPOFOL; N/A     Comment:  Procedure: COLONOSCOPY WITH  PROPOFOL;  Surgeon: [place],               Benay Pike, MD;  Location: [facility];  Service:               Gastroenterology;  Laterality: N/A; 02/23/2019: DIALYSIS/PERMA CATHETER INSERTION; N/A     Comment:  Procedure: DIALYSIS/PERMA CATHETER INSERTION;  Surgeon:               Algernon Huxley, MD;  Location: [place] [facility];                Service: Cardiovascular;  Laterality: N/A; 04/03/2020: DIALYSIS/PERMA CATHETER REMOVAL; N/A     Comment:  Procedure: DIALYSIS/PERMA CATHETER REMOVAL;  Surgeon:               Katha Cabal, MD;  Location: [place] [facility];               Service: Cardiovascular;  Laterality: N/A; 02/03/2022: DIALYSIS/PERMA CATHETER REMOVAL; N/A     Comment:  Procedure: DIALYSIS/PERMA CATHETER REMOVAL;  Surgeon:               Katha Cabal, MD;  Location: [place] [facility];               Service: Cardiovascular;  Laterality: N/A; 10/11/2017: I & D EXTREMITY; Left     Comment:  Procedure: IRRIGATION AND DEBRIDEMENT EXTREMITY;                Surgeon: Katha Cabal, MD;  Location: [facility];                Service: Vascular;  Laterality: Left; 10/07/2017: INCISION AND DRAINAGE ABSCESS; Right     Comment:  Procedure: INCISION AND DRAINAGE ABSCESS;  Surgeon:               Katha Cabal, MD;  Location: [facility];  Service:               Vascular;  Laterality: Right; 10/03/2017: IRRIGATION AND DEBRIDEMENT ABSCESS; Left     Comment:  Procedure: IRRIGATION AND DEBRIDEMENT ABSCESS with               debridement of skin, soft tissue, muscle 50sq cm;                Surgeon: Algernon Huxley, MD;  Location: [facility];  Service:  General;  Laterality: Left; 10/15/2022: LOWER EXTREMITY ANGIOGRAPHY; Left     Comment:  Procedure: Lower Extremity Angiography;  Surgeon:               Katha Cabal, MD;  Location: [place] [facility];               Service: Cardiovascular;  Laterality: Left; 02/20/2019: TEMPORARY DIALYSIS CATHETER     Comment:  Procedure:  TEMPORARY DIALYSIS CATHETER;  Surgeon:               Katha Cabal, MD;  Location: [place] [facility];               Service: Cardiovascular;; 09/18/2019: UPPER EXTREMITY ANGIOGRAPHY; Left     Comment:  Procedure: UPPER EXTREMITY ANGIOGRAPHY;  Surgeon:               Katha Cabal, MD;  Location: [place] [facility];               Service: Cardiovascular;  Laterality: Left;  BMI    Body Mass Index: 48.69 kg/m      Reproductive/Obstetrics negative OB ROS                             Anesthesia Physical Anesthesia Plan  ASA: 4  Anesthesia Plan: General ETT   Post-op Pain Management: Toradol IV (intra-op)* and Ofirmev IV (intra-op)*   Induction: Intravenous  PONV Risk Score and Plan: Ondansetron, Dexamethasone, Midazolam and Treatment may vary due to age or medical condition  Airway Management Planned: Oral ETT  Additional Equipment:   Intra-op Plan:   Post-operative Plan: Extubation in OR  Informed Consent: I have reviewed the patients History and Physical, chart, labs and discussed the procedure including the risks, benefits and alternatives for the proposed anesthesia with the patient or authorized representative who has indicated his/her understanding and acceptance.     Dental Advisory Given  Plan Discussed with: Anesthesiologist, CRNA and Surgeon  Anesthesia Plan Comments: (Patient consented for risks of anesthesia including but not limited to:  - adverse reactions to medications - damage to eyes, teeth, lips or other oral mucosa - nerve damage due to positioning  - sore throat or hoarseness - Damage to heart, brain, nerves, lungs, other parts of body or loss of life  Patient voiced understanding.)        Anesthesia Quick Evaluation

## 2022-11-10 NOTE — Progress Notes (Signed)
Patient awake/alert x4  LLE with dressing:  cellerate in both wounds, wound vac sponge and then loban placed over. Pulses with doppler. Family updated  dinner ordered. Pain medication as ordered.

## 2022-11-10 NOTE — Anesthesia Postprocedure Evaluation (Signed)
Anesthesia Post Note  Patient: [name]  Procedure(s) Performed: DEBRIDEMENT WOUND (Left: Leg Lower) APPLICATION OF WOUND VAC (Left: Leg Lower)  Patient location during evaluation: PACU Anesthesia Type: General Level of consciousness: awake and alert Pain management: pain level controlled Vital Signs Assessment: post-procedure vital signs reviewed and stable Respiratory status: spontaneous breathing, nonlabored ventilation, respiratory function stable and patient connected to nasal cannula oxygen Cardiovascular status: blood pressure returned to baseline and stable Postop Assessment: no apparent nausea or vomiting Anesthetic complications: no   No notable events documented.   Last Vitals:  Vitals:   11/10/22 1347 11/10/22 1353  BP: (!) 163/91   Pulse: 90 93  Resp: (!) 22 (!) 27  Temp: 36.7 C   SpO2: 94% 95%    Last Pain:  Vitals:   11/10/22 1400  PainSc: 0-No pain                 Ilene Qua

## 2022-11-10 NOTE — Transfer of Care (Signed)
Immediate Anesthesia Transfer of Care Note  Patient: [name]  Procedure(s) Performed: DEBRIDEMENT WOUND (Left: Leg Lower) APPLICATION OF WOUND VAC (Left: Leg Lower)  Patient Location: PACU  Anesthesia Type:General  Level of Consciousness: awake and drowsy  Airway & Oxygen Therapy: Patient Spontanous Breathing and Patient connected to face mask oxygen  Post-op Assessment: Report given to RN and Post -op Vital signs reviewed and stable  Post vital signs: Reviewed and stable  Last Vitals:  Vitals Value Taken Time  BP 163/91 11/10/22 1347  Temp    Pulse 93 11/10/22 1353  Resp 27 11/10/22 1353  SpO2 95 % 11/10/22 1353  Vitals shown include unvalidated device data.  Last Pain:  Vitals:   11/10/22 1121  PainSc: 0-No pain         Complications: No notable events documented.

## 2022-11-10 NOTE — Assessment & Plan Note (Signed)
-   Continue home bromocriptine nightly

## 2022-11-10 NOTE — Assessment & Plan Note (Addendum)
Patient states that since having COVID approximately 6 weeks ago, he has been experiencing a persistent nonproductive cough and wheezing.  His nephrologist started him on an albuterol inhaler that he feels has helped him significantly.  On examination today, he does have diffuse expiratory wheezing that is mild in nature.  No prior history of asthma or COPD, so most likely reactive airway in the setting of recent viral illness.  - Continue albuterol as needed - Tessalon Perles and guaifenesin for cough

## 2022-11-10 NOTE — Interval H&P Note (Signed)
History and Physical Interval Note:  11/10/2022 12:08 PM  [name]  has presented today for surgery, with the diagnosis of Non-pressure chronic ulcer of unspecified part of unspecified lower leg with necrosis of muscle, Cellulitis of left lower limb.  The various methods of treatment have been discussed with the patient and family. After consideration of risks, benefits and other options for treatment, the patient has consented to  Procedure(s): DEBRIDEMENT WOUND (Left) APPLICATION OF WOUND VAC (Left) as a surgical intervention.  The patient's history has been reviewed, patient examined, no change in status, stable for surgery.  I have reviewed the patient's chart and labs.  Questions were answered to the patient's satisfaction.     [name]

## 2022-11-10 NOTE — Assessment & Plan Note (Signed)
Patient is hypertensive on admission, however improved to 141/79 after operation. Per chart review, he is currently on amlodipine and atenolol as needed for SBP above 190.  - Continue home antihypertensives as needed - If remains markedly hypertensive, could add on amlodipine scheduled

## 2022-11-10 NOTE — Assessment & Plan Note (Signed)
-   Nephrology consulted; appreciate their recommendations.  Will be dialyzed today 

## 2022-11-10 NOTE — Anesthesia Procedure Notes (Signed)
Procedure Name: Intubation Date/Time: 11/10/2022 12:15 PM  Performed by: Debe Coder, CRNAPre-anesthesia Checklist: Patient identified, Emergency Drugs available, Suction available and Patient being monitored Patient Re-evaluated:Patient Re-evaluated prior to induction Oxygen Delivery Method: Circle system utilized Preoxygenation: Pre-oxygenation with 100% oxygen Induction Type: IV induction Ventilation: Mask ventilation without difficulty Laryngoscope Size: McGraph and 4 Grade View: Grade II Tube type: Oral Tube size: 7.5 mm Number of attempts: 1 Airway Equipment and Method: Stylet, Patient positioned with wedge pillow and Video-laryngoscopy Placement Confirmation: ETT inserted through vocal cords under direct vision, positive ETCO2 and breath sounds checked- equal and bilateral Secured at: 23 cm Tube secured with: Tape Dental Injury: Teeth and Oropharynx as per pre-operative assessment

## 2022-11-10 NOTE — Consult Note (Addendum)
Initial Consultation Note   Patient: [name] [id number] DOB: [birth year] PCP: [name] Hartigan, MD DOA: 11/10/2022 DOS: the patient was seen and examined on 11/10/2022 Primary service: Katha Cabal, MD  Referring physician: Dr. Delana Meyer Reason for consult: Type 2 Diabetes (uncontrolled), HTN,   Assessment/Plan: Assessment and Plan:  * Gangrene of lower extremity ([place]) Secondary to PAD and uncontrolled type 2 diabetes.  - Management per vascular surgery - Bone biopsy pending - Continue home aspirin, Plavix and statin  Hypertension Patient is hypertensive on admission, however improved to 141/79 after operation. Per chart review, he is currently on amlodipine and atenolol as needed for SBP above 190.  - Continue home antihypertensives as needed - If remains markedly hypertensive, could add on amlodipine scheduled  Diabetes mellitus with hyperglycemia ([place]) A1c last checked in January 2024, elevated at 8.6 %.  Patient has recently established with endocrinology.  He is currently on Lantus 18 units at bedtime.  Dexcom has been placed as well, with patient stating it has been helpful.  - Continue Lantus 10 units at bedtime - SSI, moderate  End stage renal disease [facility]) - Nephrology consulted; appreciate their recommendations  Reactive airway disease Patient states that since having COVID approximately 6 weeks ago, he has been experiencing a persistent nonproductive cough and wheezing.  His nephrologist started him on an albuterol inhaler that he feels has helped him significantly.  On examination today, he does have diffuse expiratory wheezing that is mild in nature.  No prior history of asthma or COPD, so most likely reactive airway in the setting of recent viral illness.  - Continue albuterol as needed - Tessalon Perles and guaifenesin for cough  Hyperprolactinemia (HCC) - Continue home bromocriptine nightly   TRH will continue to follow the patient.  HPI:  [name] is a 52 y.o. male with past medical history of uncontrolled type 2 diabetes, hyperprolactinemia 2/2 pituitary microadenoma, PAD, hypertension, ESRD on HD, OSA, hyperlipidemia, who presents to the [facility] for wound debridement.  Vascular surgery requested TRH consultation due to history of uncontrolled type 2 diabetes and hypertension.  [name] states that he has been doing well with the exception of his lower extremity wound that has been difficult to heal.  He notes that he has had cellulitis multiple times and has required debridement multiple times.  He denies any recent fevers or chills.  He does note a history of nonproductive cough and shortness of breath since having COVID approximately 6 weeks ago.  He was started on albuterol inhaler and feels that this has helped a lot.  He denies any chest pain or palpitations.  He denies any nausea, vomiting, diarrhea.  On arrival to [facility] patient was hypertensive at 155/87 with heart rate of 77.  He was initially saturating at 100% on room air, but subsequently placed on 2 L of supplemental oxygen after intubation for wound debridement.  Lab work today notable for WBC of 6.6, hemoglobin of 10.5, platelets of 191, potassium 4.8, glucose 116, BUN 42, creatinine 8.92 and GFR of 7.  Review of Systems: As mentioned in the history of present illness. All other systems reviewed and are negative.  Past Medical History:  Diagnosis Date   Anemia    Chronic kidney disease    14% FUNCTION   Diabetes mellitus without complication ([place])    Diabetic retinopathy ([place])    Dyspnea    DOE   Dysrhythmia    End stage renal disease ([place])  History of orthopnea    error wrong patietn   Hypercholesteremia    error   Hyperkalemia    error wrong patient   Hypertension    Long-term insulin use ([place])    Lymphedema of leg    Metabolic encephalopathy    error   Microalbuminuria    MSSA (methicillin susceptible Staphylococcus aureus)    Obesity    PVD  (peripheral vascular disease) (HCC)    Respiratory failure (HCC)    Error   Sepsis ([place])    error   Sleep apnea    no CPAP   UTI (urinary tract infection)    error   Venous ulcer ([place])    Vitamin D deficiency    Past Surgical History:  Procedure Laterality Date   A/V FISTULAGRAM Right 10/24/2018   Procedure: A/V FISTULAGRAM;  Surgeon: Katha Cabal, MD;  Location: [place] [facility];  Service: Cardiovascular;  Laterality: Right;   A/V FISTULAGRAM Right 11/24/2018   Procedure: A/V FISTULAGRAM;  Surgeon: Katha Cabal, MD;  Location: [place] [facility];  Service: Cardiovascular;  Laterality: Right;   A/V FISTULAGRAM Left 07/31/2019   Procedure: A/V FISTULAGRAM;  Surgeon: Katha Cabal, MD;  Location: [place] [facility];  Service: Cardiovascular;  Laterality: Left;   A/V FISTULAGRAM Left 12/11/2019   Procedure: A/V FISTULAGRAM;  Surgeon: Katha Cabal, MD;  Location: [place] [facility];  Service: Cardiovascular;  Laterality: Left;   A/V SHUNT INTERVENTION Right 02/21/2019   Procedure: A/V SHUNT INTERVENTION;  Surgeon: Katha Cabal, MD;  Location: [place] [facility];  Service: Cardiovascular;  Laterality: Right;   APPLICATION OF WOUND VAC Left 10/03/2017   Procedure: APPLICATION OF WOUND VAC;  Surgeon: Algernon Huxley, MD;  Location: [facility];  Service: General;  Laterality: Left;   APPLICATION OF WOUND VAC Left 10/11/2017   Procedure: APPLICATION OF WOUND VAC;  Surgeon: Katha Cabal, MD;  Location: [facility];  Service: Vascular;  Laterality: Left;   APPLICATION OF WOUND VAC Left 10/14/2017   Procedure: WOUND VAC CHANGE;  Surgeon: Katha Cabal, MD;  Location: [facility];  Service: Vascular;  Laterality: Left;  left lower leg   APPLICATION OF WOUND VAC Left 10/18/2017   Procedure: WOUND VAC CHANGE;  Surgeon: Katha Cabal, MD;  Location: [facility];  Service: Vascular;  Laterality: Left;   APPLICATION OF WOUND VAC Left 10/07/2017   Procedure: APPLICATION  OF WOUND VAC;  Surgeon: Katha Cabal, MD;  Location: [facility];  Service: Vascular;  Laterality: Left;   AV FISTULA INSERTION W/ RF MAGNETIC GUIDANCE Right 07/19/2018   Procedure: AV FISTULA INSERTION W/RF MAGNETIC GUIDANCE;  Surgeon: Katha Cabal, MD;  Location: [place] [facility];  Service: Cardiovascular;  Laterality: Right;   AV FISTULA PLACEMENT Left 05/11/2019   Procedure: ARTERIOVENOUS (AV) FISTULA CREATION (RADIOCEPHALIC );  Surgeon: Katha Cabal, MD;  Location: [facility];  Service: Vascular;  Laterality: Left;   AV FISTULA PLACEMENT Left 02/20/2020   Procedure: INSERTION OF ARTERIOVENOUS (AV) GORE-TEX GRAFT LEFT ARM ( FOREARM LOOP );  Surgeon: Katha Cabal, MD;  Location: [facility];  Service: Vascular;  Laterality: Left;   CATARACT EXTRACTION W/PHACO Left 11/01/2018   Procedure: CATARACT EXTRACTION PHACO AND INTRAOCULAR LENS PLACEMENT (IOC)-LEFT, DIABETIC-INSULIN DEPENDENT;  Surgeon: Eulogio Bear, MD;  Location: [facility];  Service: Ophthalmology;  Laterality: Left;  [place] 00:41.8 CDE 4.61 Fluid Pack Lot # [id number]   CATARACT EXTRACTION W/PHACO Right 04/27/2019  Procedure: CATARACT EXTRACTION PHACO AND INTRAOCULAR LENS PLACEMENT (IOC);  Surgeon: Eulogio Bear, MD;  Location: [facility];  Service: Ophthalmology;  Laterality: Right;  [place] 00:34 CDE 1.97 FLUID PACK LOT # [id number]    COLONOSCOPY WITH PROPOFOL N/A 05/12/2020   Procedure: COLONOSCOPY WITH PROPOFOL;  Surgeon: [place], Benay Pike, MD;  Location: [facility];  Service: Gastroenterology;  Laterality: N/A;   DIALYSIS/PERMA CATHETER INSERTION N/A 02/23/2019   Procedure: DIALYSIS/PERMA CATHETER INSERTION;  Surgeon: Algernon Huxley, MD;  Location: [place] [facility];  Service: Cardiovascular;  Laterality: N/A;   DIALYSIS/PERMA CATHETER REMOVAL N/A 04/03/2020   Procedure: DIALYSIS/PERMA CATHETER REMOVAL;  Surgeon: Katha Cabal, MD;  Location: [place] [facility];  Service: Cardiovascular;  Laterality: N/A;    DIALYSIS/PERMA CATHETER REMOVAL N/A 02/03/2022   Procedure: DIALYSIS/PERMA CATHETER REMOVAL;  Surgeon: Katha Cabal, MD;  Location: [place] [facility];  Service: Cardiovascular;  Laterality: N/A;   I & D EXTREMITY Left 10/11/2017   Procedure: IRRIGATION AND DEBRIDEMENT EXTREMITY;  Surgeon: Katha Cabal, MD;  Location: [facility];  Service: Vascular;  Laterality: Left;   INCISION AND DRAINAGE ABSCESS Right 10/07/2017   Procedure: INCISION AND DRAINAGE ABSCESS;  Surgeon: Katha Cabal, MD;  Location: [facility];  Service: Vascular;  Laterality: Right;   IRRIGATION AND DEBRIDEMENT ABSCESS Left 10/03/2017   Procedure: IRRIGATION AND DEBRIDEMENT ABSCESS with debridement of skin, soft tissue, muscle 50sq cm;  Surgeon: Algernon Huxley, MD;  Location: [facility];  Service: General;  Laterality: Left;   LOWER EXTREMITY ANGIOGRAPHY Left 10/15/2022   Procedure: Lower Extremity Angiography;  Surgeon: Katha Cabal, MD;  Location: [place] [facility];  Service: Cardiovascular;  Laterality: Left;   TEMPORARY DIALYSIS CATHETER  02/20/2019   Procedure: TEMPORARY DIALYSIS CATHETER;  Surgeon: Katha Cabal, MD;  Location: [place] [facility];  Service: Cardiovascular;;   UPPER EXTREMITY ANGIOGRAPHY Left 09/18/2019   Procedure: UPPER EXTREMITY ANGIOGRAPHY;  Surgeon: Katha Cabal, MD;  Location: [place] [facility];  Service: Cardiovascular;  Laterality: Left;   Social History:  reports that he has quit smoking. His smoking use included cigars. He has been exposed to tobacco smoke. He quit smokeless tobacco use about 5 years ago. He reports current alcohol use of about 1.0 standard drink of alcohol per week. He reports that he does not currently use drugs.  Allergies  Allergen Reactions   Lisinopril Swelling    Lips mouth    Furosemide Cough    Light headed, blurry vision, weakness.   Latex    Adhesive [Tape] Itching and Rash    Family History  Problem Relation Age of Onset   Diabetes  Father    Kidney disease Father    Diabetes Daughter     Prior to Admission medications   Medication Sig Start Date End Date Taking? Authorizing Provider  acetaminophen (TYLENOL) 325 MG tablet Take 2 tablets (650 mg total) by mouth every 6 (six) hours as needed for mild pain (or Fever >/= 101). 10/05/22  Yes Jennye Boroughs, MD  albuterol (VENTOLIN HFA) 108 (90 Base) MCG/ACT inhaler Inhale 2 puffs into the lungs every 6 (six) hours as needed. 11/02/22  Yes [provider]  allopurinol (ZYLOPRIM) 100 MG tablet Take 100 mg by mouth daily.   Yes [provider]  atorvastatin (LIPITOR) 10 MG tablet Take 1 tablet (10 mg total) by mouth daily. Further refills per primary care 10/16/22 10/16/23 Yes Schnier, Dolores Lory, MD  B Complex-C-Folic Acid (RENA-VITE  RX) 1 MG TABS Take 1 tablet by mouth 3 (three) times a week.  08/30/19  Yes [provider]  calcium acetate (PHOSLO) 667 MG capsule Take 1,334-2,001 mg by mouth See admin instructions. Take 2001 mg by mouth with each meal & take 1334 mg by mouth with each snack. 01/23/19  Yes [provider]  cinacalcet (SENSIPAR) 30 MG tablet Take 30 mg by mouth daily.   Yes [provider]  CINNAMON PO Take 500 mg by mouth 2 (two) times daily.    Yes [provider]  clopidogrel (PLAVIX) 75 MG tablet Take 1 tablet (75 mg total) by mouth daily. 10/16/22  Yes Schnier, Dolores Lory, MD  Insulin Glargine (LANTUS SOLOSTAR) 100 UNIT/ML Solostar Pen Inject 15 Units into the skin at bedtime.    Yes [provider]  loratadine (CLARITIN) 10 MG tablet Take 10 mg by mouth daily as needed for allergies.   Yes [provider]  Multiple Vitamin (MULTIVITAMIN WITH MINERALS) TABS tablet Take 1 tablet by mouth 3 (three) times a week. Men's Multivitamin   Yes [provider]  Multiple Vitamins-Minerals (EYE HEALTH PO) Take 1 capsule by mouth daily.    Yes [provider]  traZODone (DESYREL) 50 MG tablet  Take 50 mg by mouth at bedtime. 11/04/22  Yes [provider]  amLODipine (NORVASC) 10 MG tablet Take 5 mg by mouth daily as needed (if bp is 190/90 or higher).     [provider]  atenolol (TENORMIN) 50 MG tablet Take 25 mg by mouth daily as needed (if bp is 190/90 or higher).     [provider]  bromocriptine (PARLODEL) 2.5 MG tablet TAKE 1 TABLET BY MOUTH AT BEDTIME. 06/12/21   Renato Shin, MD  ibuprofen (ADVIL) 200 MG tablet Take 200 mg by mouth every 6 (six) hours as needed for headache or moderate pain.    [provider]  lidocaine-prilocaine (EMLA) cream Apply 1 application topically as needed (port access).    [provider]  melatonin 5 MG TABS Take 5 mg by mouth at bedtime as needed (sleep).     [provider]  Polyethyl Glycol-Propyl Glycol (SYSTANE OP) Place 1 drop into both eyes daily as needed (dry eyes).    [provider]  sildenafil (VIAGRA) 100 MG tablet Take 1 tablet (100 mg total) by mouth daily as needed for erectile dysfunction. 06/28/22   Zara Council A, PA-C  warfarin (COUMADIN) 5 MG tablet Take 1.5 tablets (7.5 mg total) by mouth daily. 10/05/22   Jennye Boroughs, MD    Physical Exam: Vitals:   11/10/22 1545 11/10/22 1600 11/10/22 1630 11/10/22 1700  BP:  (!) 141/79 (!) 142/52 (!) 134/48  Pulse:  76 74 74  Resp:  '14 13 15  '$ Temp:   (!) 97.5 F (36.4 C) 97.7 F (36.5 C)  SpO2: 90% 100% 99% 98%  Weight:      Height:       Physical Exam Vitals and nursing note reviewed.  Constitutional:      General: He is not in acute distress.    Appearance: He is obese. He is not toxic-appearing.  HENT:     Head: Normocephalic and atraumatic.     Mouth/Throat:     Mouth: Mucous membranes are moist.     Pharynx: Oropharynx is clear.  Eyes:     Conjunctiva/sclera: Conjunctivae normal.     Pupils: Pupils are equal, round, and reactive to light.  Cardiovascular:  Rate and Rhythm: Normal rate and regular  rhythm.     Heart sounds: No murmur heard. Pulmonary:     Effort: Pulmonary effort is normal. No respiratory distress.     Breath sounds: Wheezing (Diffuse expiratory wheezing, mild, heard throughout) present. No rhonchi or rales.  Abdominal:     General: Bowel sounds are normal.     Palpations: Abdomen is soft.  Skin:    General: Skin is warm and dry.  Neurological:     General: No focal deficit present.     Mental Status: He is alert and oriented to person, place, and time. Mental status is at baseline.  Psychiatric:        Mood and Affect: Mood normal.        Behavior: Behavior normal.    Data Reviewed:  CBC with WBC of 6.6, hemoglobin 10.5, MCV of 99, platelets of 191 BMP with sodium of 137, potassium 4.8, bicarb 26, glucose 116, BUN 42, creatinine 8.92, anion gap 10, GFR 7.  Repeat BMP with potassium of 4.9 and glucose of 110.  No other significant changes. Ionized calcium of 1.14 INR within normal limits at 1.0  EKG on admission with sinus rhythm with rate of 75.  No acute ST or T wave changes concerning for acute ischemia.  J-point elevation noted in the lateral leads.  There are no new results to review at this time.   Family Communication: No family at bedside Primary team communication: Discussed with primary team  Thank you very much for involving [place] in the care of your patient.  Author: Jose Persia, MD 11/10/2022 5:15 PM  For on call review [email or web address].

## 2022-11-10 NOTE — Assessment & Plan Note (Addendum)
Secondary to PAD and uncontrolled type 2 diabetes.  - Management per vascular surgery - Bone biopsy pending - Continue home aspirin, Plavix and statin

## 2022-11-11 ENCOUNTER — Encounter: Payer: Self-pay | Admitting: Vascular Surgery

## 2022-11-11 DIAGNOSIS — I96 Gangrene, not elsewhere classified: Secondary | ICD-10-CM | POA: Diagnosis not present

## 2022-11-11 LAB — RENAL FUNCTION PANEL
Albumin: 3 g/dL — ABNORMAL LOW (ref 3.5–5.0)
Anion gap: 8 (ref 5–15)
BUN: 52 mg/dL — ABNORMAL HIGH (ref 6–20)
CO2: 25 mmol/L (ref 22–32)
Calcium: 8.8 mg/dL — ABNORMAL LOW (ref 8.9–10.3)
Chloride: 101 mmol/L (ref 98–111)
Creatinine, Ser: 10.75 mg/dL — ABNORMAL HIGH (ref 0.61–1.24)
GFR, Estimated: 5 mL/min — ABNORMAL LOW
Glucose, Bld: 124 mg/dL — ABNORMAL HIGH (ref 70–99)
Phosphorus: 6.3 mg/dL — ABNORMAL HIGH (ref 2.5–4.6)
Potassium: 5.3 mmol/L — ABNORMAL HIGH (ref 3.5–5.1)
Sodium: 134 mmol/L — ABNORMAL LOW (ref 135–145)

## 2022-11-11 LAB — GLUCOSE, CAPILLARY
Glucose-Capillary: 135 mg/dL — ABNORMAL HIGH (ref 70–99)
Glucose-Capillary: 82 mg/dL (ref 70–99)
Glucose-Capillary: 182 mg/dL — ABNORMAL HIGH (ref 70–99)
Glucose-Capillary: 174 mg/dL — ABNORMAL HIGH (ref 70–99)

## 2022-11-11 LAB — CBC
HCT: 29 % — ABNORMAL LOW (ref 39.0–52.0)
Hemoglobin: 8.8 g/dL — ABNORMAL LOW (ref 13.0–17.0)
MCH: 30.8 pg (ref 26.0–34.0)
MCHC: 30.3 g/dL (ref 30.0–36.0)
MCV: 101.4 fL — ABNORMAL HIGH (ref 80.0–100.0)
Platelets: 153 K/uL (ref 150–400)
RBC: 2.86 MIL/uL — ABNORMAL LOW (ref 4.22–5.81)
RDW: 14.9 % (ref 11.5–15.5)
WBC: 5.7 K/uL (ref 4.0–10.5)
nRBC: 0 % (ref 0.0–0.2)

## 2022-11-11 LAB — HEPATITIS B SURFACE ANTIGEN: Hepatitis B Surface Ag: NONREACTIVE

## 2022-11-11 MED ORDER — INSULIN ASPART 100 UNIT/ML IJ SOLN
0.0000 [IU] | Freq: Three times a day (TID) | INTRAMUSCULAR | Status: DC
  Administered 2022-11-11 – 2022-11-23 (×2): 1 [IU] via SUBCUTANEOUS
  Filled 2022-11-11 (×2): qty 1

## 2022-11-11 MED ORDER — DOCUSATE SODIUM 100 MG PO CAPS
200.0000 mg | ORAL_CAPSULE | Freq: Two times a day (BID) | ORAL | Status: DC
  Administered 2022-11-11 – 2022-11-22 (×9): 200 mg via ORAL
  Filled 2022-11-11 (×13): qty 2

## 2022-11-11 MED ORDER — CHLORHEXIDINE GLUCONATE CLOTH 2 % EX PADS
6.0000 | MEDICATED_PAD | Freq: Once | CUTANEOUS | Status: AC
  Administered 2022-11-11: 6 via TOPICAL

## 2022-11-11 MED ORDER — VITAMIN C 500 MG PO TABS
500.0000 mg | ORAL_TABLET | Freq: Two times a day (BID) | ORAL | Status: DC
  Administered 2022-11-11 – 2022-11-25 (×24): 500 mg via ORAL
  Filled 2022-11-11 (×25): qty 1

## 2022-11-11 MED ORDER — SODIUM CHLORIDE 0.9 % IV SOLN: INTRAVENOUS | Status: DC

## 2022-11-11 MED ORDER — ZINC SULFATE 220 (50 ZN) MG PO CAPS
220.0000 mg | ORAL_CAPSULE | Freq: Every day | ORAL | Status: DC
  Administered 2022-11-11 – 2022-11-25 (×14): 220 mg via ORAL
  Filled 2022-11-11 (×15): qty 1

## 2022-11-11 MED ORDER — FAMOTIDINE 20 MG PO TABS: 40.0000 mg | ORAL_TABLET | Freq: Once | ORAL | Status: DC | PRN

## 2022-11-11 MED ORDER — RENA-VITE PO TABS
1.0000 | ORAL_TABLET | Freq: Every day | ORAL | Status: DC
  Administered 2022-11-11 – 2022-11-24 (×13): 1 via ORAL
  Filled 2022-11-11 (×14): qty 1

## 2022-11-11 MED ORDER — MIDAZOLAM HCL 2 MG/ML PO SYRP: 8.0000 mg | ORAL_SOLUTION | Freq: Once | ORAL | Status: DC | PRN

## 2022-11-11 MED ORDER — FENTANYL CITRATE PF 50 MCG/ML IJ SOSY: 12.5000 ug | PREFILLED_SYRINGE | Freq: Once | INTRAMUSCULAR | Status: DC | PRN

## 2022-11-11 MED ORDER — HYDROMORPHONE HCL 1 MG/ML IJ SOLN: 1.0000 mg | Freq: Once | INTRAMUSCULAR | Status: DC | PRN

## 2022-11-11 MED ORDER — METHYLPREDNISOLONE SODIUM SUCC 125 MG IJ SOLR: 125.0000 mg | Freq: Once | INTRAMUSCULAR | Status: DC | PRN

## 2022-11-11 MED ORDER — NEPRO/CARBSTEADY PO LIQD
237.0000 mL | Freq: Three times a day (TID) | ORAL | Status: DC
  Administered 2022-11-11 – 2022-11-25 (×22): 237 mL via ORAL

## 2022-11-11 MED ORDER — CEFAZOLIN SODIUM-DEXTROSE 2-4 GM/100ML-% IV SOLN
2.0000 g | INTRAVENOUS | Status: AC
  Administered 2022-11-12: 2 g via INTRAVENOUS
  Administered 2022-11-12: 1 g via INTRAVENOUS
  Filled 2022-11-11: qty 100

## 2022-11-11 MED ORDER — BISACODYL 10 MG RE SUPP: 10.0000 mg | Freq: Every day | RECTAL | Status: DC | PRN

## 2022-11-11 MED ORDER — POLYETHYLENE GLYCOL 3350 17 G PO PACK
17.0000 g | PACK | Freq: Four times a day (QID) | ORAL | Status: DC | PRN
  Administered 2022-11-11: 17 g via ORAL
  Filled 2022-11-11: qty 1

## 2022-11-11 MED ORDER — ONDANSETRON HCL 4 MG/2ML IJ SOLN
4.0000 mg | Freq: Four times a day (QID) | INTRAMUSCULAR | Status: DC | PRN
  Administered 2022-11-17: 4 mg via INTRAVENOUS

## 2022-11-11 MED ORDER — DIPHENHYDRAMINE HCL 50 MG/ML IJ SOLN: 50.0000 mg | Freq: Once | INTRAMUSCULAR | Status: DC | PRN

## 2022-11-11 NOTE — Progress Notes (Signed)
Report received from HD RN.

## 2022-11-11 NOTE — Progress Notes (Signed)
[facility]  ROUNDING NOTE   Subjective:   [name] is a 52 year old male with past medical conditions including anemia, hypertension, diabetes, PVD, sleep apnea, vitamin D deficiency, and end-stage renal disease on hemodialysis.  Patient presents to the hospital for scheduled debridement of left lower venous ulcers.  Patient currently admitted for Gangrene of lower extremity [facility]) [I96]  Patient is known to our practice and receives outpatient dialysis treatments at [facility] on a TTS schedule, supervised by Dr. [name].  Last treatment completed on Tuesday.  Patient currently seen and evaluated during dialysis.   HEMODIALYSIS FLOWSHEET:  Blood Flow Rate (mL/min): 400 mL/min Arterial Pressure (mmHg): -220 mmHg Venous Pressure (mmHg): 260 mmHg TMP (mmHg): 7 mmHg Ultrafiltration Rate (mL/min): 742 mL/min Dialysate Flow Rate (mL/min): 300 ml/min  Tolerating treatment well.  Patient currently reports no pain in lower extremity.  Remains on room air, denies shortness of breath.  Mild lower extremity edema.  Negative pressure wound VAC in place at left lower extremity.  Morning labs significant for potassium 5.3, BUN 52, creatinine 10.75 with GFR 5, and hemoglobin 8.8.  We have been consulted to manage dialysis needs during this admission.   Objective:  Vital signs in last 24 hours:  Temp:  [97.5 F (36.4 C)-98.7 F (37.1 C)] 98.5 F (36.9 C) (03/14 1545) Pulse Rate:  [67-91] 67 (03/14 1549) Resp:  [13-20] 20 (03/14 1545) BP: (112-162)/(48-108) 143/87 (03/14 1549) SpO2:  [91 %-99 %] 93 % (03/14 1545) Weight:  [175.1 kg-177 kg] 175.1 kg (03/14 1230)  Weight change:  Filed Weights   11/10/22 1121 11/11/22 0803 11/11/22 1230  Weight: (!) 181.4 kg (!) 177 kg (!) 175.1 kg    Intake/Output: I/O last 3 completed shifts: In: 710 [P.O.:500; I.V.:60; IV Piggyback:150] Out: 80 [Drains:75; Blood:5]   Intake/Output this shift:  Total I/O In: -  Out: 2000  [Other:2000]  Physical Exam: General: NAD  Head: Normocephalic, atraumatic. Moist oral mucosal membranes  Eyes: Anicteric  Lungs:  Clear to auscultation, normal effort  Heart: Regular rate and rhythm  Abdomen:  Soft, nontender, obese  Extremities:  1+ peripheral edema.  Neurologic: Nonfocal, moving all four extremities  Skin: No lesions  Access: Lt AVG    Basic Metabolic Panel: Recent Labs  Lab 11/10/22 1122 11/10/22 1127 11/11/22 0811  NA 137 140 134*  K 4.8 4.9 5.3*  CL 101 102 101  CO2 26  --  25  GLUCOSE 116* 110* 124*  BUN 42* 38* 52*  CREATININE 8.92* 10.70* 10.75*  CALCIUM 9.4  --  8.8*  PHOS  --   --  6.3*    Liver Function Tests: Recent Labs  Lab 11/11/22 0811  ALBUMIN 3.0*   No results for input(s): "LIPASE", "AMYLASE" in the last 168 hours. No results for input(s): "AMMONIA" in the last 168 hours.  CBC: Recent Labs  Lab 11/10/22 1122 11/10/22 1127 11/11/22 0811  WBC 6.6  --  5.7  NEUTROABS 4.4  --   --   HGB 10.5* 11.6* 8.8*  HCT 34.0* 34.0* 29.0*  MCV 99.7  --  101.4*  PLT 191  --  153    Cardiac Enzymes: No results for input(s): "CKTOTAL", "CKMB", "CKMBINDEX", "TROPONINI" in the last 168 hours.  BNP: Invalid input(s): "POCBNP"  CBG: Recent Labs  Lab 11/10/22 1348 11/10/22 1724 11/10/22 2148 11/11/22 0726 11/11/22 1314  GLUCAP 101* 135* 201* 135* 82    Microbiology: Results for orders placed or performed during the hospital encounter  of 11/10/22  Aerobic/Anaerobic Culture w Gram Stain (surgical/deep wound)     Status: None (Preliminary result)   Collection Time: 11/10/22 12:45 PM   Specimen: Leg, Left; Wound  Result Value Ref Range Status   Specimen Description   Final    LEG LEFT CALF WOUND Performed at [facility], [address] [place], [place] [address]    Special Requests   Final    NONE Performed at [facility], [place], [place] [address]    Gram Stain NO WBC SEEN RARE GRAM  NEGATIVE RODS   Final   Culture   Final    FEW GRAM NEGATIVE RODS SUSCEPTIBILITIES TO FOLLOW Performed at [facility], [name] [address] [place], [place] [address]    Report Status PENDING  Incomplete  Aerobic/Anaerobic Culture w Gram Stain (surgical/deep wound)     Status: None (Preliminary result)   Collection Time: 11/10/22  1:00 PM   Specimen: Foot, Left; Wound  Result Value Ref Range Status   Specimen Description   Final    FOOT LEFT FOOT Performed at [facility], [address] [place], [place] [address]    Special Requests   Final    NONE Performed at [facility], [address] [place],  [address]    Gram Stain NO WBC SEEN NO ORGANISMS SEEN   Final   Culture   Final    CULTURE REINCUBATED FOR BETTER GROWTH Performed at [facility], [place] [address], [place],  [address]    Report Status PENDING  Incomplete    Coagulation Studies: Recent Labs    11/10/22 1122  LABPROT 13.6  INR 1.0    Urinalysis: No results for input(s): "COLORURINE", "LABSPEC", "PHURINE", "GLUCOSEU", "HGBUR", "BILIRUBINUR", "KETONESUR", "PROTEINUR", "UROBILINOGEN", "NITRITE", "LEUKOCYTESUR" in the last 72 hours.  Invalid input(s): "APPERANCEUR"    Imaging: No results found.   Medications:     allopurinol  100 mg Oral Daily   vitamin C  500 mg Oral BID   atorvastatin  10 mg Oral Daily   bromocriptine  2.5 mg Oral QHS   calcium acetate  2,001 mg Oral TID WC   Chlorhexidine Gluconate Cloth  6 each Topical Q0600   cinacalcet  30 mg Oral Daily   clopidogrel  75 mg Oral Daily   docusate sodium  200 mg Oral BID   feeding supplement (NEPRO CARB STEADY)  237 mL Oral TID BM   guaiFENesin  600 mg Oral BID   heparin  5,000 Units Subcutaneous Q8H   insulin aspart  0-6 Units Subcutaneous TID WC   insulin glargine-yfgn  10 Units Subcutaneous QHS   multivitamin  1 tablet Oral QHS   traZODone  50 mg Oral QHS   zinc sulfate  220 mg Oral Daily    albuterol, amLODipine, atenolol, benzonatate, bisacodyl, calcium acetate, lidocaine-prilocaine, loratadine, melatonin, oxyCODONE-acetaminophen, polyethylene glycol  Assessment/ Plan:  [name] is a 52 y.o.  male with past medical conditions including anemia, hypertension, diabetes, PVD, sleep apnea, vitamin D deficiency, and end-stage renal disease on hemodialysis.  Patient presents to the hospital for scheduled debridement of left lower venous ulcers.  Patient currently admitted for Gangrene of lower extremity ([place]) [I96]    End stage renal disease on hemodialysis. Last treatment received on Tuesday.  Patient receiving scheduled treatment today, UF goal 1.5 to 2 L as tolerated.  Next treatment scheduled for Saturday.  2. Anemia of chronic kidney disease.  Lab Results  Component Value Date   HGB 8.8 (L) 11/11/2022    Hemoglobin below desired goal however acceptable.  Patient receives Mircera at outpatient clinic.  Will monitor for now.  3. Secondary Hyperparathyroidism: with outpatient labs: PTH 270, phosphorus 5.0, calcium 9.2 on 11/09/22.   Lab Results  Component Value Date   CALCIUM 8.8 (L) 11/11/2022   CAION 1.14 (L) 11/10/2022   PHOS 6.3 (H) 11/11/2022    Patient currently prescribed calcium acetate and calcitriol outpatient.  Calcium acceptable however phosphorus elevated.  Continue calcium acetate with meals.  4.  Hypertension with chronic kidney disease.  Home regimen includes amlodipine and atenolol.  Currently receiving these medications    LOS: 1 [place] 3/14/20244:07 PM

## 2022-11-11 NOTE — Progress Notes (Signed)
  Progress Note    11/11/2022 5:09 PM 1 Day Post-Op  Subjective:  [name] is a 52 y.o. male with past medical history of uncontrolled type 2 diabetes, hyperprolactinemia 2/2 pituitary microadenoma, PAD, hypertension, ESRD on HD, OSA, hyperlipidemia, who presents to the [facility] for wound debridement.  Vascular surgery requested TRH consultation due to history of uncontrolled type 2 diabetes and hypertension.   [name] states that he has been doing well with the exception of his lower extremity wound that has been difficult to heal.  He notes that he has had cellulitis multiple times and has required debridement multiple times.   Today upon exam patient is resting comfortably in bed.  Left lower extremity noted to have wound VAC in place and working properly.  Serous bloody drainage noticed to the canister.  Patient has no other complaints at this time and his vitals all remained stable.   Vitals:   11/11/22 1545 11/11/22 1549  BP: (!) 156/108 (!) 143/87  Pulse: 91 67  Resp: 20   Temp: 98.5 F (36.9 C)   SpO2: 93%    Physical Exam: Cardiac:  RRR Lungs: Normal respiratory effort, bilateral lungs clear on auscultation Incisions: Left lower extremity wound vac working well. Extremities: Lower extremity wound VAC in place and working well Abdomen: Bowel sounds are normal, abdomen is soft nontender nondistended Neurologic: AAOx3, neuro intact, follows all commands appropriately.  CBC    Component Value Date/Time   WBC 5.7 11/11/2022 0811   RBC 2.86 (L) 11/11/2022 0811   HGB 8.8 (L) 11/11/2022 0811   HCT 29.0 (L) 11/11/2022 0811   PLT 153 11/11/2022 0811   MCV 101.4 (H) 11/11/2022 0811   MCH 30.8 11/11/2022 0811   MCHC 30.3 11/11/2022 0811   RDW 14.9 11/11/2022 0811   LYMPHSABS 1.3 11/10/2022 1122   MONOABS 0.6 11/10/2022 1122   EOSABS 0.2 11/10/2022 1122   BASOSABS 0.1 11/10/2022 1122    BMET    Component Value Date/Time   NA 134 (L) 11/11/2022 0811   K 5.3 (H)  11/11/2022 0811   CL 101 11/11/2022 0811   CO2 25 11/11/2022 0811   GLUCOSE 124 (H) 11/11/2022 0811   BUN 52 (H) 11/11/2022 0811   CREATININE 10.75 (H) 11/11/2022 0811   CALCIUM 8.8 (L) 11/11/2022 0811   GFRNONAA 5 (L) 11/11/2022 0811   GFRAA 9 (L) 02/19/2020 1316    INR    Component Value Date/Time   INR 1.0 11/10/2022 1122     Intake/Output Summary (Last 24 hours) at 11/11/2022 1709 Last data filed at 11/11/2022 1203 Gross per 24 hour  Intake 275 ml  Output 2075 ml  Net -1800 ml     Assessment/Plan:  52 y.o. male is s/p incision and drainage of left lower extremity with wound VAC placement.  1 Day Post-Op   Plan: Patient is scheduled to return to the operating room tomorrow 11/12/2022 for further incision and drainage with debridement of left lower extremity.  Wound vac will be changed at this time as well.  DVT prophylaxis: Plavix 75 mg daily, heparin injection subcu 5000 units every 8 hours   Drema [facility] 11/11/2022 5:09 PM

## 2022-11-11 NOTE — Progress Notes (Signed)
Progress Note   Patient: [name] [id number] DOB: [birth year] DOA: 11/10/2022     1 DOS: the patient was seen and examined on 11/11/2022   Brief hospital course: [name] is a 52 y.o. male with past medical history of uncontrolled type 2 diabetes, hyperprolactinemia 2/2 pituitary microadenoma, PAD, hypertension, ESRD on HD, OSA, hyperlipidemia, who presents to the [facility] for wound debridement.  Vascular surgery requested TRH consultation due to history of uncontrolled type 2 diabetes and hypertension.   [name] states that he has been doing well with the exception of his lower extremity wound that has been difficult to heal.  He notes that he has had cellulitis multiple times and has required debridement multiple times.  He denies any recent fevers or chills.  He does note a history of nonproductive cough and shortness of breath since having COVID approximately 6 weeks ago.  He was started on albuterol inhaler and feels that this has helped a lot.  He denies any chest pain or palpitations.  He denies any nausea, vomiting, diarrhea.     Assessment and Plan: * Gangrene of lower extremity ([place]) Secondary to PAD and uncontrolled type 2 diabetes. Status post excisional debridement of 2 left lower extremity venous ulcers with placement of wound VAC dressing to both venous ulcers Optimize glycemic control Further management per vascular surgery   Hypertension Blood pressure is stable on amlodipine and atenolol    Diabetes mellitus with hyperglycemia (HCC) A1c last checked in January 2024, elevated at 8.6 %.   Patient has recently established with endocrinology.  He is currently on Lantus 10 units at bedtime.   Dexcom has been placed as well, with patient stating it has been helpful. Blood sugars are stable   End stage renal disease [facility]) - Nephrology consulted Renal replacement therapy T/TH/S   Reactive airway disease Patient states that since having COVID approximately 6 weeks  ago, he has been experiencing a persistent nonproductive cough and wheezing.    Continue albuterol as needed Tessalon Perles and guaifenesin for cough   Hyperprolactinemia (HCC) - Continue home bromocriptine nightly    Morbid Obesity (BMI 46.99) Lifestyle modification and exercise has been discussed with patient in detail Complicates overall prognosis and care.        Subjective: Patient is seen and examined in the dialysis unit.  No new complaints  Physical Exam: Vitals:   11/11/22 1200 11/11/22 1203 11/11/22 1230 11/11/22 1312  BP: (!) 158/95 (!) 162/88  115/67  Pulse: 87 87  88  Resp: '20 16  18  '$ Temp:  97.7 F (36.5 C)  98.3 F (36.8 C)  TempSrc:  Oral    SpO2: 94% 95%  95%  Weight:   (!) 175.1 kg   Height:       Vitals and nursing note reviewed.  Constitutional:      General: He is not in acute distress.    Appearance: He is obese. He is not toxic-appearing.  HENT:     Head: Normocephalic and atraumatic.     Mouth/Throat:     Mouth: Mucous membranes are moist.     Pharynx: Oropharynx is clear.  Eyes:     Conjunctiva/sclera: Conjunctivae normal.     Pupils: Pupils are equal, round, and reactive to light.  Cardiovascular:     Rate and Rhythm: Normal rate and regular rhythm.     Heart sounds: No murmur heard. Pulmonary:     Effort: Pulmonary effort is normal. No respiratory distress.  Breath sounds: Wheezing (Mild expiratory wheezing, mild, heard throughout) present. No rhonchi or rales.  Abdominal:     General: Bowel sounds are normal.     Palpations: Abdomen is soft.  Skin:    General: Skin is warm and dry.  Neurological:     General: No focal deficit present.     Mental Status: He is alert and oriented to person, place, and time. Mental status is at baseline.  Psychiatric:        Mood and Affect: Mood normal.        Behavior: Behavior normal.  Data Reviewed: Labs reviewed.  Mild hyperkalemia. There are no new results to review at this  time.  Family Communication:   Disposition: Status is: Inpatient Remains inpatient appropriate because: Management of left lower extremity gangrene  Planned Discharge Destination: Home    Time spent: 30 minutes  Author: Collier Bullock, MD 11/11/2022 1:13 PM  For on call review [email or web address].

## 2022-11-11 NOTE — Progress Notes (Signed)
  Received patient in bed to unit.   Informed consent signed and in chart.    TX duration:3.5hrs     Transported back to floor Hand-off given to patient's nurse.    Access used: L AVG Access issues: none   Total UF removed: 2.0KGS Medication(s) given: none Post HD VS: stable   140/81  Post HD weight: 175.1Kgs     Darrol Jump  [facility]

## 2022-11-11 NOTE — Progress Notes (Addendum)
Initial Nutrition Assessment  DOCUMENTATION CODES:   Morbid obesity  INTERVENTION:   Nepro Shake po TID, each supplement provides 425 kcal and 19 grams protein  Double protein on meal trays  Rena-vit po daily   Ocuvite daily for wound healing (provides zinc, vitamin A, vitamin C, Vitamin E, copper, and selenium)  Vitamin C '500mg'$  po BID  Zinc '220mg'$  po daily x 30 days  Daily weights   NUTRITION DIAGNOSIS:   Increased nutrient needs related to chronic illness, wound healing as evidenced by estimated needs.  GOAL:   Patient will meet greater than or equal to 90% of their needs  MONITOR:   PO intake, Supplement acceptance, Labs, Weight trends, I & O's, Skin  REASON FOR ASSESSMENT:   Consult Assessment of nutrition requirement/status  ASSESSMENT:   52 y/o male with h/o HTN, IDDM, ESRD on HD, gout, OSA, PVD, recent COVID 19, chronic VSUs, hyperprolactinemia r/t pituitary microadenoma and hyperlipidemia who presents with gangrene of lower extremity s/p I & D of two left lower extremity venous ulcers with placement of wound VAC 3/13.  Met with pt in room today. Pt reports good appetite and oral intake at baseline. Pt does report having COVID ~6 weeks ago and reports decreased oral intake at that time. Pt reports that he drinks Nepro supplements at home and takes ocuvite and rena-vite daily. Pt also reports eating protein bars at dialysis. Pt reports good appetite and oral intake in hospital. RD discussed with pt the importance of adequate nutrition needed to preserve lean muscle and to support wound healing. Pt is willing to drink Nepro in hospital. RD will add supplements  and vitamins to help pt meet his estimated needs. Per chart, pt appears to be down 22lbs(5%) since October; this is not significant.   Medications reviewed and include: allopurinol, phoslo, cinacalcet, plavix, colace, heparin, insulin  Labs reviewed: Na 134(L), K 5.3(H), BUN 52(H), creat 10.75(H), P  6.3(H) Hgb 8.8(L), Hct 29.0(L) Cbgs- 82, 135 x 24 hrs  AIC 8.8(H)- 1/29  VAC- 78m   NUTRITION - FOCUSED PHYSICAL EXAM:  Flowsheet Row Most Recent Value  Orbital Region No depletion  Upper Arm Region No depletion  Thoracic and Lumbar Region No depletion  Buccal Region No depletion  Temple Region No depletion  Clavicle Bone Region No depletion  Clavicle and Acromion Bone Region No depletion  Scapular Bone Region No depletion  Dorsal Hand No depletion  Patellar Region No depletion  Anterior Thigh Region No depletion  Posterior Calf Region No depletion  Edema (RD Assessment) Severe  Hair Reviewed  Eyes Reviewed  Mouth Reviewed  Skin Reviewed  Nails Reviewed   Diet Order:   Diet Order             Diet renal with fluid restriction Fluid restriction: 1200 mL Fluid; Room service appropriate? Yes; Fluid consistency: Thin  Diet effective now                  EDUCATION NEEDS:   Education needs have been addressed  Skin:  Skin Assessment: Reviewed RN Assessment (incision L leg), VAC  Last BM:  3/13  Height:   Ht Readings from Last 1 Encounters:  11/10/22 '6\' 4"'$  (1.93 m)    Weight:   Wt Readings from Last 1 Encounters:  11/11/22 (!) 175.1 kg    Ideal Body Weight:  91.8 kg  BMI:  Body mass index is 46.99 kg/m.  Estimated Nutritional Needs:   Kcal:  3000-3300kcal/day  Protein:  >150g/day  Fluid:  UOP +1L  Koleen Distance MS, RD, LDN Please refer to [facility] for RD and/or RD on-call/weekend/after hours pager

## 2022-11-11 NOTE — TOC Initial Note (Signed)
Transition of Care [facility]) - Initial/Assessment Note    Patient Details  Name: [name] MRN: [id number] Date of Birth: [birth year]  Transition of Care [facility]) CM/SW Contact:    Beverly Sessions, RN Phone Number: 11/11/2022, 4:29 PM  Clinical Narrative:                  Notified by Velna Hatchet at [facility] that patient is open with RN OT       Patient Goals and CMS Choice            Expected Discharge Plan and Services                                              Prior Living Arrangements/Services                       Activities of Daily Living Home Assistive Devices/Equipment: Cane (specify quad or straight) ADL Screening (condition at time of admission) Patient's cognitive ability adequate to safely complete daily activities?: Yes Is the patient deaf or have difficulty hearing?: No Does the patient have difficulty seeing, even when wearing glasses/contacts?: No Does the patient have difficulty concentrating, remembering, or making decisions?: No Patient able to express need for assistance with ADLs?: Yes Does the patient have difficulty dressing or bathing?: No Independently performs ADLs?: Yes (appropriate for developmental age) Does the patient have difficulty walking or climbing stairs?: No Weakness of Legs: None Weakness of Arms/Hands: None  Permission Sought/Granted                  Emotional Assessment              Admission diagnosis:  Gangrene of lower extremity ([place]) [I96] Patient Active Problem List   Diagnosis Date Noted   Gangrene of lower extremity ([place]) 11/10/2022   Reactive airway disease 11/10/2022   Diabetic ulcer of lower leg ([place]) 09/27/2022   COVID-19 virus infection 09/27/2022   Low testosterone 02/01/2021   Screening for malignant neoplasm of prostate 01/28/2021   Hyperprolactinemia ([place]) 07/03/2020   Gout 08/16/2019   End-stage renal disease ([place]) [id number]   Complication of vascular access for  dialysis 06/07/2019   Diabetic retinopathy of both eyes ([place]) 04/12/2019   Obesity, Class III, BMI 40-49.9 (morbid obesity) ([place]) 04/12/2019   AVF (arteriovenous fistula) ([place]) 02/22/2019   Hyperkalemia 02/20/2019   End stage renal disease ([place]) 05/29/2018   Venous ulcer of ankle, left ([place]) 05/29/2018   Lymphedema 11/03/2017   Cellulitis of left lower extremity 10/01/2017   Chronic venous insufficiency 10/01/2017   Hypertensive disorder 10/01/2017   Hypertension 09/20/2017   Diabetes mellitus with hyperglycemia ([place]) 09/20/2017   Swelling of limb 09/20/2017   CKD (chronic kidney disease) stage 5, GFR less than 15 ml/min ([place]) 08/30/2017   PVD (peripheral vascular disease) ([place]) 06/18/2014   Long-term insulin use ([place]) 06/13/2014   Microalbuminuria 03/12/2014   Type 1 diabetes mellitus ([place]) 08/31/2003   PCP:  [name], MD Pharmacy:   [id number] [place]Phone: [phone]: [phone]    Social Determinants of Health (SDOH) Social History: SDOH Screenings   Food Insecurity: No Food Insecurity (11/10/2022)  Housing: Low Risk  (11/10/2022)  Transportation Needs: No Transportation Needs (11/10/2022)  Utilities: Not At Risk (  11/10/2022)  Tobacco Use: Medium Risk (11/11/2022)   SDOH Interventions:     Readmission Risk Interventions     No data to display

## 2022-11-11 NOTE — Discharge Planning (Addendum)
Correction: ESTABLISHED HEMODIALYSIS [facility]   [place], [place] [address] [id number]   Scheduled days: Tuesday Thursday and Saturday  Treatment time: 7:00am

## 2022-11-12 ENCOUNTER — Inpatient Hospital Stay: Payer: Medicare HMO | Admitting: Anesthesiology

## 2022-11-12 ENCOUNTER — Encounter
Admission: RE | Disposition: A | Discharge status: Home Health | Payer: Self-pay | Source: Home / Self Care | Attending: Vascular Surgery

## 2022-11-12 DIAGNOSIS — I96 Gangrene, not elsewhere classified: Secondary | ICD-10-CM | POA: Diagnosis not present

## 2022-11-12 DIAGNOSIS — I87332 Chronic venous hypertension (idiopathic) with ulcer and inflammation of left lower extremity: Secondary | ICD-10-CM | POA: Diagnosis not present

## 2022-11-12 DIAGNOSIS — L97529 Non-pressure chronic ulcer of other part of left foot with unspecified severity: Secondary | ICD-10-CM | POA: Diagnosis not present

## 2022-11-12 DIAGNOSIS — L97329 Non-pressure chronic ulcer of left ankle with unspecified severity: Secondary | ICD-10-CM | POA: Diagnosis not present

## 2022-11-12 HISTORY — PX: WOUND DEBRIDEMENT: SHX247

## 2022-11-12 HISTORY — PX: APPLICATION OF WOUND VAC: SHX5189

## 2022-11-12 LAB — HEPATITIS B E ANTIBODY: Hep B E Ab: NEGATIVE

## 2022-11-12 LAB — GLUCOSE, CAPILLARY
Glucose-Capillary: 105 mg/dL — ABNORMAL HIGH (ref 70–99)
Glucose-Capillary: 100 mg/dL — ABNORMAL HIGH (ref 70–99)
Glucose-Capillary: 139 mg/dL — ABNORMAL HIGH (ref 70–99)
Glucose-Capillary: 112 mg/dL — ABNORMAL HIGH (ref 70–99)

## 2022-11-12 LAB — POCT I-STAT, CHEM 8
BUN: 41 mg/dL — ABNORMAL HIGH (ref 6–20)
Calcium, Ion: 1.14 mmol/L — ABNORMAL LOW (ref 1.15–1.40)
Chloride: 98 mmol/L (ref 98–111)
Creatinine, Ser: 10.5 mg/dL — ABNORMAL HIGH (ref 0.61–1.24)
Glucose, Bld: 105 mg/dL — ABNORMAL HIGH (ref 70–99)
HCT: 28 % — ABNORMAL LOW (ref 39.0–52.0)
Hemoglobin: 9.5 g/dL — ABNORMAL LOW (ref 13.0–17.0)
Potassium: 4.5 mmol/L (ref 3.5–5.1)
Sodium: 137 mmol/L (ref 135–145)
TCO2: 30 mmol/L (ref 22–32)

## 2022-11-12 LAB — PROTIME-INR
INR: 1.1 (ref 0.8–1.2)
Prothrombin Time: 13.6 seconds (ref 11.4–15.2)

## 2022-11-12 LAB — HEPATITIS B SURFACE ANTIBODY, QUANTITATIVE: Hep B S AB Quant (Post): 59.2 m[IU]/mL (ref >9.9)

## 2022-11-12 SURGERY — DEBRIDEMENT, WOUND
Anesthesia: General | Site: Leg Lower | Laterality: Left

## 2022-11-12 MED ORDER — PHENYLEPHRINE HCL-NACL 20-0.9 MG/250ML-% IV SOLN
INTRAVENOUS | Status: AC
  Filled 2022-11-12: qty 250

## 2022-11-12 MED ORDER — ONDANSETRON HCL 4 MG/2ML IJ SOLN: 4.0000 mg | Freq: Once | INTRAMUSCULAR | Status: DC | PRN

## 2022-11-12 MED ORDER — CEFAZOLIN SODIUM-DEXTROSE 2-4 GM/100ML-% IV SOLN
INTRAVENOUS | Status: AC
  Filled 2022-11-12: qty 100

## 2022-11-12 MED ORDER — GLYCOPYRROLATE 0.2 MG/ML IJ SOLN
INTRAMUSCULAR | Status: DC | PRN
  Administered 2022-11-12: .2 mg via INTRAVENOUS

## 2022-11-12 MED ORDER — FENTANYL CITRATE (PF) 100 MCG/2ML IJ SOLN
INTRAMUSCULAR | Status: AC
  Filled 2022-11-12: qty 2

## 2022-11-12 MED ORDER — ACETAMINOPHEN 10 MG/ML IV SOLN: 1000.0000 mg | Freq: Once | INTRAVENOUS | Status: DC | PRN

## 2022-11-12 MED ORDER — OXYCODONE HCL 5 MG PO TABS
ORAL_TABLET | ORAL | Status: AC
  Filled 2022-11-12: qty 1

## 2022-11-12 MED ORDER — PHENYLEPHRINE 80 MCG/ML (10ML) SYRINGE FOR IV PUSH (FOR BLOOD PRESSURE SUPPORT)
PREFILLED_SYRINGE | INTRAVENOUS | Status: DC | PRN
  Administered 2022-11-12 (×2): 160 ug via INTRAVENOUS

## 2022-11-12 MED ORDER — APIXABAN 5 MG PO TABS
5.0000 mg | ORAL_TABLET | Freq: Two times a day (BID) | ORAL | Status: DC
  Administered 2022-11-13 – 2022-11-25 (×21): 5 mg via ORAL
  Filled 2022-11-12 (×22): qty 1

## 2022-11-12 MED ORDER — CHLORHEXIDINE GLUCONATE 0.12 % MT SOLN: 15.0000 mL | Freq: Once | OROMUCOSAL | Status: AC

## 2022-11-12 MED ORDER — SUCCINYLCHOLINE CHLORIDE 200 MG/10ML IV SOSY
PREFILLED_SYRINGE | INTRAVENOUS | Status: DC | PRN
  Administered 2022-11-12: 200 mg via INTRAVENOUS

## 2022-11-12 MED ORDER — OXYCODONE HCL 5 MG PO TABS
5.0000 mg | ORAL_TABLET | Freq: Once | ORAL | Status: AC | PRN
  Administered 2022-11-12: 5 mg via ORAL

## 2022-11-12 MED ORDER — PIPERACILLIN-TAZOBACTAM 3.375 G IVPB: 3.3750 g | Freq: Three times a day (TID) | INTRAVENOUS | Status: DC

## 2022-11-12 MED ORDER — PIPERACILLIN-TAZOBACTAM 4.5 G IVPB: 4.5000 g | Freq: Three times a day (TID) | INTRAVENOUS | Status: DC

## 2022-11-12 MED ORDER — CHLORHEXIDINE GLUCONATE 0.12 % MT SOLN
OROMUCOSAL | Status: AC
  Administered 2022-11-12: 15 mL via OROMUCOSAL
  Filled 2022-11-12: qty 15

## 2022-11-12 MED ORDER — FENTANYL CITRATE (PF) 100 MCG/2ML IJ SOLN
INTRAMUSCULAR | Status: DC | PRN
  Administered 2022-11-12: 50 ug via INTRAVENOUS

## 2022-11-12 MED ORDER — OXYCODONE HCL 5 MG/5ML PO SOLN: 5.0000 mg | Freq: Once | ORAL | Status: AC | PRN

## 2022-11-12 MED ORDER — POLYETHYLENE GLYCOL 3350 17 G PO PACK
17.0000 g | PACK | Freq: Every day | ORAL | Status: DC
  Administered 2022-11-12 – 2022-11-22 (×5): 17 g via ORAL
  Filled 2022-11-12 (×5): qty 1

## 2022-11-12 MED ORDER — CEFAZOLIN SODIUM-DEXTROSE 1-4 GM/50ML-% IV SOLN
INTRAVENOUS | Status: AC
  Filled 2022-11-12: qty 50

## 2022-11-12 MED ORDER — PIPERACILLIN-TAZOBACTAM IN DEX 2-0.25 GM/50ML IV SOLN
2.2500 g | Freq: Three times a day (TID) | INTRAVENOUS | Status: DC
  Administered 2022-11-12 – 2022-11-17 (×15): 2.25 g via INTRAVENOUS
  Filled 2022-11-12 (×16): qty 50

## 2022-11-12 MED ORDER — EPHEDRINE SULFATE (PRESSORS) 50 MG/ML IJ SOLN
INTRAMUSCULAR | Status: DC | PRN
  Administered 2022-11-12: 5 mg via INTRAVENOUS

## 2022-11-12 MED ORDER — FENTANYL CITRATE (PF) 100 MCG/2ML IJ SOLN
25.0000 ug | INTRAMUSCULAR | Status: DC | PRN
  Administered 2022-11-12: 50 ug via INTRAVENOUS

## 2022-11-12 MED ORDER — PHENYLEPHRINE 80 MCG/ML (10ML) SYRINGE FOR IV PUSH (FOR BLOOD PRESSURE SUPPORT)
PREFILLED_SYRINGE | INTRAVENOUS | Status: AC
  Filled 2022-11-12: qty 10

## 2022-11-12 MED ORDER — ACETAMINOPHEN 10 MG/ML IV SOLN
INTRAVENOUS | Status: AC
  Filled 2022-11-12: qty 100

## 2022-11-12 MED ORDER — ACETAMINOPHEN 10 MG/ML IV SOLN
INTRAVENOUS | Status: DC | PRN
  Administered 2022-11-12: 1000 mg via INTRAVENOUS

## 2022-11-12 MED ORDER — PHENYLEPHRINE HCL-NACL 20-0.9 MG/250ML-% IV SOLN
INTRAVENOUS | Status: DC | PRN
  Administered 2022-11-12: 25 ug/min via INTRAVENOUS

## 2022-11-12 MED ORDER — PROPOFOL 10 MG/ML IV BOLUS
INTRAVENOUS | Status: DC | PRN
  Administered 2022-11-12: 240 mg via INTRAVENOUS

## 2022-11-12 MED ORDER — ONDANSETRON HCL 4 MG/2ML IJ SOLN
INTRAMUSCULAR | Status: DC | PRN
  Administered 2022-11-12 (×2): 4 mg via INTRAVENOUS

## 2022-11-12 MED ORDER — 0.9 % SODIUM CHLORIDE (POUR BTL) OPTIME
TOPICAL | Status: DC | PRN
  Administered 2022-11-12: 1000 mL

## 2022-11-12 MED ORDER — LIDOCAINE HCL (CARDIAC) PF 100 MG/5ML IV SOSY
PREFILLED_SYRINGE | INTRAVENOUS | Status: DC | PRN
  Administered 2022-11-12: 100 mg via INTRAVENOUS

## 2022-11-12 SURGICAL SUPPLY — 40 items
AGENT HMST PWDR BTL CLGN 5GM (Miscellaneous) ×4 IMPLANT
APL PRP STRL LF DISP 70% ISPRP (MISCELLANEOUS) ×2
BASIN KIT SINGLE STR (MISCELLANEOUS) IMPLANT
BNDG CMPR 5X6 CHSV STRCH STRL (GAUZE/BANDAGES/DRESSINGS) ×2
BNDG CMPR 75X21 PLY HI ABS (MISCELLANEOUS)
BNDG COHESIVE 6X5 TAN ST LF (GAUZE/BANDAGES/DRESSINGS) ×2 IMPLANT
CANISTER WOUND CARE 500ML ATS (WOUND CARE) IMPLANT
CHLORAPREP W/TINT 26 (MISCELLANEOUS) ×2 IMPLANT
COLLAGEN CELLERATERX 5 GRAM (Miscellaneous) IMPLANT
CONNECTOR Y WND VAC (MISCELLANEOUS) IMPLANT
DRAPE DERMATAC (DRAPES) IMPLANT
DRAPE EXTREMITY 106X87X128.5 (DRAPES) IMPLANT
DRAPE INCISE IOBAN 66X45 STRL (DRAPES) IMPLANT
DRSG EMULSION OIL 3X3 NADH (GAUZE/BANDAGES/DRESSINGS) IMPLANT
DRSG EMULSION OIL 3X8 NADH (GAUZE/BANDAGES/DRESSINGS) IMPLANT
DRSG VAC GRANUFOAM LG (GAUZE/BANDAGES/DRESSINGS) IMPLANT
DRSG VAC GRANUFOAM MED (GAUZE/BANDAGES/DRESSINGS) IMPLANT
ELECT REM PT RETURN 9FT ADLT (ELECTROSURGICAL) ×2
ELECTRODE REM PT RTRN 9FT ADLT (ELECTROSURGICAL) ×2 IMPLANT
GAUZE SPONGE 4X4 12PLY STRL (GAUZE/BANDAGES/DRESSINGS) IMPLANT
GAUZE STRETCH 2X75IN STRL (MISCELLANEOUS) IMPLANT
GLOVE BIO SURGEON STRL SZ7 (GLOVE) ×2 IMPLANT
GLOVE SURG SYN 8.0 (GLOVE) ×2 IMPLANT
GLOVE SURG SYN 8.0 PF PI (GLOVE) ×2 IMPLANT
GOWN STRL REUS W/ TWL LRG LVL3 (GOWN DISPOSABLE) ×4 IMPLANT
GOWN STRL REUS W/ TWL XL LVL3 (GOWN DISPOSABLE) ×2 IMPLANT
GOWN STRL REUS W/TWL LRG LVL3 (GOWN DISPOSABLE) ×4
GOWN STRL REUS W/TWL XL LVL3 (GOWN DISPOSABLE) ×2
KIT TURNOVER KIT A (KITS) ×2 IMPLANT
LABEL OR SOLS (LABEL) ×2 IMPLANT
MANIFOLD NEPTUNE II (INSTRUMENTS) ×2 IMPLANT
NS IRRIG 500ML POUR BTL (IV SOLUTION) ×2 IMPLANT
PACK EXTREMITY ARMC (MISCELLANEOUS) ×2 IMPLANT
PAD NEG PRESSURE SENSATRAC (MISCELLANEOUS) IMPLANT
PAD PREP 24X41 OB/GYN DISP (PERSONAL CARE ITEMS) ×2 IMPLANT
SOL PREP PVP 2OZ (MISCELLANEOUS) ×2
SOLUTION PREP PVP 2OZ (MISCELLANEOUS) ×2 IMPLANT
STOCKINETTE IMPERV 14X48 (MISCELLANEOUS) ×2 IMPLANT
TRAP FLUID SMOKE EVACUATOR (MISCELLANEOUS) ×2 IMPLANT
WATER STERILE IRR 500ML POUR (IV SOLUTION) ×2 IMPLANT

## 2022-11-12 NOTE — Interval H&P Note (Signed)
History and Physical Interval Note:  11/12/2022 10:26 AM  [name]  has presented today for surgery, with the diagnosis of Non-pressure chronic ulcer of unspecified part of unspecified lower leg with necrosis of muscle, Cellulitis of left lower limb.  The various methods of treatment have been discussed with the patient and family. After consideration of risks, benefits and other options for treatment, the patient has consented to  Procedure(s): DEBRIDEMENT WOUND (Left) APPLICATION OF WOUND VAC (Left) as a surgical intervention.  The patient's history has been reviewed, patient examined, no change in status, stable for surgery.  I have reviewed the patient's chart and labs.  Questions were answered to the patient's satisfaction.     [name]

## 2022-11-12 NOTE — Anesthesia Preprocedure Evaluation (Signed)
Anesthesia Evaluation  Patient identified by MRN, date of birth, ID band Patient awake    Reviewed: Allergy & Precautions, H&P , NPO status , Patient's Chart, lab work & pertinent test results  History of Anesthesia Complications Negative for: history of anesthetic complications  Airway Mallampati: III  TM Distance: >3 FB Neck ROM: full   Comment: Large neck Dental  (+) Chipped, Poor Dentition, Missing   Pulmonary shortness of breath, sleep apnea , pneumonia, resolved, former smoker Covid hospitalization Feb 2024, some residual post covid symptoms such as cough and SOB    + decreased breath sounds      Cardiovascular hypertension, (-) angina + Peripheral Vascular Disease and + Orthopnea  (-) Past MI Normal cardiovascular exam+ dysrhythmias  Rhythm:Regular Rate:Normal     Neuro/Psych negative neurological ROS  negative psych ROS   GI/Hepatic negative GI ROS, Neg liver ROS,neg GERD  ,,  Endo/Other  diabetes, Type 2, Insulin Dependent  Morbid obesity  Renal/GU ESRF and DialysisRenal diseaseLast dialyzed yesterday     Musculoskeletal   Abdominal  (+) + obese  Peds  Hematology negative hematology ROS (+)   Anesthesia Other Findings Past Medical History: No date: Anemia No date: Chronic kidney disease     Comment:  14% FUNCTION No date: Diabetes mellitus without complication (HCC) No date: Dyspnea     Comment:  DOE No date: Dysrhythmia No date: End stage renal disease (HCC) No date: History of orthopnea     Comment:  error wrong patietn No date: Hypercholesteremia     Comment:  error No date: Hyperkalemia     Comment:  error wrong patient No date: Hypertension No date: Lymphedema of leg No date: Metabolic encephalopathy     Comment:  error No date: Microalbuminuria No date: Obesity No date: Respiratory failure (HCC)     Comment:  Error No date: Sepsis ([place])     Comment:  error No date: Sleep apnea      Comment:  no CPAP No date: UTI (urinary tract infection)     Comment:  error No date: Vitamin D deficiency  Past Surgical History: 10/24/2018: A/V FISTULAGRAM; Right     Comment:  Procedure: A/V FISTULAGRAM;  Surgeon: Katha Cabal, MD;  Location: [place] [facility];  Service:               Cardiovascular;  Laterality: Right; 11/24/2018: A/V FISTULAGRAM; Right     Comment:  Procedure: A/V FISTULAGRAM;  Surgeon: Katha Cabal, MD;  Location: [place] [facility];  Service:               Cardiovascular;  Laterality: Right; 07/31/2019: A/V FISTULAGRAM; Left     Comment:  Procedure: A/V FISTULAGRAM;  Surgeon: Katha Cabal, MD;  Location: [place] [facility];  Service:               Cardiovascular;  Laterality: Left; 12/11/2019: A/V FISTULAGRAM; Left     Comment:  Procedure: A/V FISTULAGRAM;  Surgeon: Katha Cabal, MD;  Location: [place] [facility];  Service:               Cardiovascular;  Laterality: Left; 02/21/2019:  A/V SHUNT INTERVENTION; Right     Comment:  Procedure: A/V SHUNT INTERVENTION;  Surgeon: Katha Cabal, MD;  Location: [place] [facility];  Service:              Cardiovascular;  Laterality: Right; 0000000: APPLICATION OF WOUND VAC; Left     Comment:  Procedure: APPLICATION OF WOUND VAC;  Surgeon: Algernon Huxley, MD;  Location: [facility];  Service: General;                Laterality: Left; [id number]: APPLICATION OF WOUND VAC; Left     Comment:  Procedure: APPLICATION OF WOUND VAC;  Surgeon: Katha Cabal, MD;  Location: [facility];  Service: Vascular;                Laterality: Left; [id number]: APPLICATION OF WOUND VAC; Left     Comment:  Procedure: WOUND VAC CHANGE;  Surgeon: Katha Cabal, MD;  Location: [facility];  Service: Vascular;                Laterality: Left;  left lower leg [id number]: APPLICATION OF WOUND VAC;  Left     Comment:  Procedure: WOUND VAC CHANGE;  Surgeon: Katha Cabal, MD;  Location: [facility];  Service: Vascular;                Laterality: Left; [id number]: APPLICATION OF WOUND VAC; Left     Comment:  Procedure: APPLICATION OF WOUND VAC;  Surgeon: Katha Cabal, MD;  Location: [facility];  Service: Vascular;                Laterality: Left; 07/19/2018: AV FISTULA INSERTION W/ RF MAGNETIC GUIDANCE; Right     Comment:  Procedure: AV FISTULA INSERTION W/RF MAGNETIC GUIDANCE;               Surgeon: Katha Cabal, MD;  Location: [place]              CV [facility];  Service: Cardiovascular;  Laterality: Right; 05/11/2019: AV FISTULA PLACEMENT; Left     Comment:  Procedure: ARTERIOVENOUS (AV) FISTULA CREATION               (RADIOCEPHALIC );  Surgeon: Katha Cabal, MD;                Location: [facility];  Service: Vascular;  Laterality:               Left; 11/01/2018: CATARACT EXTRACTION W/PHACO; Left     Comment:  Procedure: CATARACT EXTRACTION PHACO AND INTRAOCULAR               LENS PLACEMENT (IOC)-LEFT, DIABETIC-INSULIN DEPENDENT;                Surgeon: Eulogio Bear, MD;  Location: [facility];                Service: Ophthalmology;  Laterality: Left;  [place]  00:41.8 CDE 4.61 Fluid Pack Lot # [id number] 04/27/2019: CATARACT EXTRACTION W/PHACO; Right     Comment:  Procedure: CATARACT EXTRACTION PHACO AND INTRAOCULAR               LENS PLACEMENT (IOC);  Surgeon: Eulogio Bear, MD;                Location: [facility];  Service: Ophthalmology;  Laterality:              Right;  [place] 00:34 CDE 1.97 FLUID PACK LOT # [id number]  02/23/2019: DIALYSIS/PERMA CATHETER INSERTION; N/A     Comment:  Procedure: DIALYSIS/PERMA CATHETER INSERTION;  Surgeon:               Algernon Huxley, MD;  Location: [place] [facility];                Service: Cardiovascular;  Laterality: N/A; 10/11/2017: I & D EXTREMITY; Left     Comment:  Procedure: IRRIGATION  AND DEBRIDEMENT EXTREMITY;                Surgeon: Katha Cabal, MD;  Location: [facility];                Service: Vascular;  Laterality: Left; 10/07/2017: INCISION AND DRAINAGE ABSCESS; Right     Comment:  Procedure: INCISION AND DRAINAGE ABSCESS;  Surgeon:               Katha Cabal, MD;  Location: [facility];  Service:               Vascular;  Laterality: Right; 10/03/2017: IRRIGATION AND DEBRIDEMENT ABSCESS; Left     Comment:  Procedure: IRRIGATION AND [place] with               debridement of skin, soft tissue, muscle 50sq cm;                Surgeon: Algernon Huxley, MD;  Location: [facility];  Service:              General;  Laterality: Left; 02/20/2019: TEMPORARY DIALYSIS CATHETER     Comment:  Procedure: TEMPORARY DIALYSIS CATHETER;  Surgeon:               Katha Cabal, MD;  Location: [place] [facility];               Service: Cardiovascular;; 09/18/2019: UPPER EXTREMITY ANGIOGRAPHY; Left     Comment:  Procedure: UPPER EXTREMITY ANGIOGRAPHY;  Surgeon:               Katha Cabal, MD;  Location: [place] [facility];               Service: Cardiovascular;  Laterality: Left;  BMI    Body Mass Index: 47.47 kg/m      Reproductive/Obstetrics negative OB ROS                              Anesthesia Physical Anesthesia Plan  ASA: 4  Anesthesia Plan: General   Post-op Pain Management:    Induction: Intravenous  PONV Risk Score and Plan: 2 and Ondansetron, Dexamethasone, Treatment may vary due to age or medical condition and Midazolam  Airway Management Planned: Natural Airway and Nasal CPAP  Additional Equipment: None  Intra-op Plan:   Post-operative Plan: Extubation in OR  Informed Consent: I have reviewed the patients  History and Physical, chart, labs and discussed the procedure including the risks, benefits and alternatives for the proposed anesthesia with the patient or authorized representative who has indicated  his/her understanding and acceptance.     Dental Advisory Given  Plan Discussed with: Anesthesiologist, CRNA and Surgeon  Anesthesia Plan Comments: (Discussed risks of anesthesia with patient, including PONV, sore throat, lip/dental/eye damage. Rare risks discussed as well, such as cardiorespiratory and neurological sequelae, and allergic reactions. Discussed the role of CRNA in patient's perioperative care. Patient understands. Patient informed about increased incidence of above perioperative risk due to high BMI. Patient understands.  )         Anesthesia Quick Evaluation

## 2022-11-12 NOTE — Progress Notes (Signed)
Progress Note   Patient: [name] [id number] DOB: 02/04/1971 DOA: 11/10/2022     2 DOS: the patient was seen and examined on 11/12/2022   Brief [name] course: [name] is a 52 y.o. male with past medical history of uncontrolled type 2 diabetes, hyperprolactinemia 2/2 pituitary microadenoma, PAD, hypertension, ESRD on HD, OSA, hyperlipidemia, who presents to the [name] for wound debridement.  Vascular surgery requested TRH consultation due to history of uncontrolled type 2 diabetes and hypertension.   [name] states that he has been doing well with the exception of his lower extremity wound that has been difficult to heal.  He notes that he has had cellulitis multiple times and has required debridement multiple times.  He denies any recent fevers or chills.  He does note a history of nonproductive cough and shortness of breath since having COVID approximately 6 weeks ago.  He was started on albuterol inhaler and feels that this has helped a lot.  He denies any chest pain or palpitations.  He denies any nausea, vomiting, diarrhea.   Assessment and Plan: Gangrene of lower extremity ([place]) Secondary to PAD and uncontrolled type 2 diabetes. Status post excisional debridement of 2 left lower extremity venous ulcers with placement of wound VAC dressing to both venous ulcers 03/13 Patient returned to the OR today for further debridement of necrotic tissue 03/15 Optimize glycemic control Further management per vascular surgery   Hypertension Blood pressure is stable on amlodipine and atenolol     Diabetes mellitus with hyperglycemia (HCC) A1c last checked in January 2024, elevated at 8.6 %.   Patient has recently established with endocrinology.  He is currently on Lantus 10 units at bedtime.   Dexcom has been placed as well, with patient stating it has been helpful. Blood sugars are stable   End stage renal disease [name]) - Nephrology consulted Renal replacement therapy T/TH/S    Reactive airway disease Patient states that since having COVID approximately 6 weeks ago, he has been experiencing a persistent nonproductive cough and wheezing.    Continue albuterol as needed Tessalon Perles and guaifenesin for cough   Hyperprolactinemia (HCC) - Continue home bromocriptine nightly       Morbid Obesity (BMI 46.99) Lifestyle modification and exercise has been discussed with patient in detail Complicates overall prognosis and care.        Subjective: Patient is seen and examined at bedside.  Status post repeat debridement.   Physical Exam: Vitals:   11/12/22 1205 11/12/22 1215 11/12/22 1230 11/12/22 1335  BP: 124/74 124/74 (!) 142/71 127/64  Pulse: 90 71 79 73  Resp: 15 16 16 18   Temp: 98.2 F (36.8 C)  98.8 F (37.1 C) 98 F (36.7 C)  TempSrc:    Oral  SpO2: 99% 100% 94% 94%  Weight:      Height:       Vitals and nursing note reviewed.  Constitutional:      General: He is not in acute distress.    Appearance: He is obese. He is not toxic-appearing.  HENT:     Head: Normocephalic and atraumatic.     Mouth/Throat:     Mouth: Mucous membranes are moist.     Pharynx: Oropharynx is clear.  Eyes:     Conjunctiva/sclera: Conjunctivae normal.     Pupils: Pupils are equal, round, and reactive to light.  Cardiovascular:     Rate and Rhythm: Normal rate and regular rhythm.     Heart sounds: No murmur  heard. Pulmonary:     Effort: Pulmonary effort is normal. No respiratory distress.     Breath sounds: Wheezing (Mild expiratory wheezing, mild, heard throughout) present. No rhonchi or rales.  Abdominal:     General: Bowel sounds are normal.     Palpations: Abdomen is soft.  Skin:    General: Skin is warm and dry.  Wound VAC on left lower extremity Neurological:     General: No focal deficit present.     Mental Status: He is alert and oriented to person, place, and time. Mental status is at baseline.  Psychiatric:        Mood and Affect: Mood  normal.        Behavior: Behavior normal.       Data Reviewed: Labs reviewed.  Elevated serum creatinine consistent with his history of end-stage renal disease There are no new results to review at this time.  Family Communication:   Disposition: Status is: Inpatient Remains inpatient appropriate because: Wound care and IV antibiotics  Planned Discharge Destination: Home    Time spent: 32 minutes  Author: Collier Bullock, MD 11/12/2022 2:00 PM  For on call review [email or web address].

## 2022-11-12 NOTE — Op Note (Signed)
    OPERATIVE NOTE   PROCEDURE: 1.  Excisional debridement of 2 left lower extremity venous ulcers. 2.  Placement of a VAC wound dressing to both venous ulcers  PRE-OPERATIVE DIAGNOSIS: Ulceration of the left lower extremity associated with gangrenous tissue and surrounding cellulitis   POST-OPERATIVE DIAGNOSIS: Same  SURGEON: [name]  ASSISTANT(S): None  ANESTHESIA: general  ESTIMATED BLOOD LOSS: 10 cc  FINDING(S): Patchy areas of necrotic tissue in association with a marked improvement in granulation  SPECIMEN(S): Necrotic debrided tissue is not sent for permanent section  INDICATIONS:   [name] is a 52 y.o. male who presents with 2 large necrotic ulcers of the left lower extremity associated with severe venous stasis dermatitis and venous insufficiency. The patient was noted to have increasing pain as well as a substantial increase in drainage in association with foul odor. Given necrotic tissue debridement is indicated. Risks and benefits were reviewed all questions were answered patient has agreed to proceed. .  DESCRIPTION: After full informed written consent was obtained from the patient, the patient was brought back to the operating room and placed supine upon the operating table.  Prior to induction, the patient received IV antibiotics.   After obtaining adequate anesthesia, the patient was then prepped and draped in the standard fashion for a excisional debridement of the 2 ulcers of the left lower extremity with application of a wound VAC.  Using Metzenbaum scissors and a curette necrotic tissue is debrided.  There is a fair amount of undermining in certain areas.  The tissues debrided were skin and subcutaneous tissues.  Muscle and fascia were intact.  The ankle was addressed first and then the dorsal foot ulcer was treated in similar fashion.   The wounds were then irrigated with a liter of saline and hemostasis was obtained with Bovie Bovie cautery.    The ankle wound is stellate in appearance and measures 25 cm in length by 12 cm in width, 300 cm.  The dorsal foot wound measures 8 cm x 6 cm, 48 cm   Cellerate was then placed in the bed of both wounds with coverage of approximately 2 mm. Two 5 g containers were utilized.  A black VAC sponge was then trimmed to the appropriate shape for both the ankle and then the dorsal foot wound.  The dressing was sealed with Ioban.  Prior to leaving the operating room the vacs were connected with a Y adapter both wounds had good seals, no leaks.  This is a hospital VAC not a disposable VAC.   The patient tolerated this procedure well.   COMPLICATIONS: None  CONDITION: [name]  Office: [phone]   11/12/2022, 12:03 PM

## 2022-11-12 NOTE — Anesthesia Postprocedure Evaluation (Signed)
Anesthesia Post Note  Patient: Renso A Cumbie  Procedure(s) Performed: DEBRIDEMENT WOUND (Left: Leg Lower) APPLICATION OF WOUND VAC (Left)  Patient location during evaluation: PACU Anesthesia Type: General Level of consciousness: awake and alert Pain management: pain level controlled Vital Signs Assessment: post-procedure vital signs reviewed and stable Respiratory status: spontaneous breathing, nonlabored ventilation, respiratory function stable and patient connected to nasal cannula oxygen Cardiovascular status: blood pressure returned to baseline and stable Postop Assessment: no apparent nausea or vomiting Anesthetic complications: no   No notable events documented.   Last Vitals:  Vitals:   11/12/22 1215 11/12/22 1230  BP: 124/74 (!) 142/71  Pulse: 71 79  Resp: 16 16  Temp:  37.1 C  SpO2: 100% 94%    Last Pain:  Vitals:   11/12/22 1245  TempSrc:   PainSc: 7                  Arita Miss

## 2022-11-12 NOTE — Progress Notes (Signed)
Dr Lorenso Courier notified pt doesn't want to be stuck and is refusing plavix and eliquis.

## 2022-11-12 NOTE — Progress Notes (Signed)
[facility]  ROUNDING NOTE   Subjective:   [name] is a 52 year old male with past medical conditions including anemia, hypertension, diabetes, PVD, sleep apnea, vitamin D deficiency, and end-stage renal disease on hemodialysis.  Patient presents to the hospital for scheduled debridement of left lower venous ulcers.  Patient currently admitted for Gangrene of lower extremity ([place]) [I96]  Hemodialysis treatment yesterday. Tolerated treatment well. UF of 2 liters.   Mother at bedside.   Patient underwent debridement of left lower extremity ulcers by Dr. Delana Meyer earlier today.    Objective:  Vital signs in last 24 hours:  Temp:  [98 F (36.7 C)-98.9 F (37.2 C)] 98 F (36.7 C) (03/15 1335) Pulse Rate:  [67-91] 73 (03/15 1335) Resp:  [15-20] 18 (03/15 1335) BP: (124-156)/(64-108) 127/64 (03/15 1335) SpO2:  [90 %-100 %] 94 % (03/15 1335) Weight:  [180.5 kg] 180.5 kg (03/15 0905)  Weight change: -4.439 kg Filed Weights   11/11/22 0803 11/11/22 1230 11/12/22 0905  Weight: (!) 177 kg (!) 175.1 kg (!) 180.5 kg    Intake/Output: I/O last 3 completed shifts: In: 515 [P.O.:515] Out: 2375 [Drains:375; Other:2000]   Intake/Output this shift:  Total I/O In: 570 [P.O.:120; I.V.:200; IV Piggyback:250] Out: 0   Physical Exam: General: NAD, laying in bed  Head: Normocephalic, atraumatic. Moist oral mucosal membranes  Eyes: Anicteric  Lungs:  Clear to auscultation, normal effort, room air  Heart: Regular rate and rhythm  Abdomen:  Soft, nontender, obese  Extremities:  Left wound vacs  Neurologic: Nonfocal, moving all four extremities  Skin: No lesions  Access: Lt AVG    Basic Metabolic Panel: Recent Labs  Lab 11/10/22 1122 11/10/22 1127 11/11/22 0811 11/12/22 0914  NA 137 140 134* 137  K 4.8 4.9 5.3* 4.5  CL 101 102 101 98  CO2 26  --  25  --   GLUCOSE 116* 110* 124* 105*  BUN 42* 38* 52* 41*  CREATININE 8.92* 10.70* 10.75* 10.50*  CALCIUM 9.4  --   8.8*  --   PHOS  --   --  6.3*  --      Liver Function Tests: Recent Labs  Lab 11/11/22 0811  ALBUMIN 3.0*    No results for input(s): "LIPASE", "AMYLASE" in the last 168 hours. No results for input(s): "AMMONIA" in the last 168 hours.  CBC: Recent Labs  Lab 11/10/22 1122 11/10/22 1127 11/11/22 0811 11/12/22 0914  WBC 6.6  --  5.7  --   NEUTROABS 4.4  --   --   --   HGB 10.5* 11.6* 8.8* 9.5*  HCT 34.0* 34.0* 29.0* 28.0*  MCV 99.7  --  101.4*  --   PLT 191  --  153  --      Cardiac Enzymes: No results for input(s): "CKTOTAL", "CKMB", "CKMBINDEX", "TROPONINI" in the last 168 hours.  BNP: Invalid input(s): "POCBNP"  CBG: Recent Labs  Lab 11/11/22 1314 11/11/22 1645 11/11/22 2119 11/12/22 0854 11/12/22 1207  GLUCAP 82 182* 174* 105* 100*     Microbiology: Results for orders placed or performed during the hospital encounter of 11/10/22  Aerobic/Anaerobic Culture w Gram Stain (surgical/deep wound)     Status: None (Preliminary result)   Collection Time: 11/10/22 12:45 PM   Specimen: Leg, Left; Wound  Result Value Ref Range Status   Specimen Description   Final    LEG LEFT CALF WOUND Performed at [facility], [address] [place], [place] [address]  Special Requests   Final    NONE Performed at [facility], [place], [place] [address]    Gram Stain   Final    NO WBC SEEN RARE GRAM NEGATIVE RODS Performed at [facility], [place] [address] [place], [place] [address]    Culture   Final    FEW ESCHERICHIA COLI RARE ENTEROCOCCUS FAECALIS NO ANAEROBES ISOLATED; CULTURE IN PROGRESS FOR 5 DAYS    Report Status PENDING  Incomplete   Organism ID, Bacteria ESCHERICHIA COLI  Final      Susceptibility   Escherichia coli - MIC*    AMPICILLIN 4 SENSITIVE Sensitive     CEFEPIME <=0.12 SENSITIVE Sensitive     CEFTAZIDIME <=1 SENSITIVE Sensitive     CEFTRIAXONE <=0.25 SENSITIVE Sensitive     CIPROFLOXACIN <=0.25  SENSITIVE Sensitive     GENTAMICIN <=1 SENSITIVE Sensitive     IMIPENEM <=0.25 SENSITIVE Sensitive     TRIMETH/SULFA >=320 RESISTANT Resistant     AMPICILLIN/SULBACTAM <=2 SENSITIVE Sensitive     PIP/TAZO <=4 SENSITIVE Sensitive     * FEW ESCHERICHIA COLI  Aerobic/Anaerobic Culture w Gram Stain (surgical/deep wound)     Status: None (Preliminary result)   Collection Time: 11/10/22  1:00 PM   Specimen: Foot, Left; Wound  Result Value Ref Range Status   Specimen Description   Final    FOOT LEFT FOOT Performed at [facility], [address], [place], [place] [address]    Special Requests   Final    NONE Performed at [facility], [address] [place], [place] [address]    Gram Stain   Final    NO WBC SEEN NO ORGANISMS SEEN Performed at [facility], [place] [address] [place], [place] [address]    Culture   Final    RARE GRAM NEGATIVE RODS RARE SERRATIA MARCESCENS CULTURE REINCUBATED FOR BETTER GROWTH NO ANAEROBES ISOLATED; CULTURE IN PROGRESS FOR 5 DAYS    Report Status PENDING  Incomplete    Coagulation Studies: Recent Labs    11/10/22 1122  LABPROT 13.6  INR 1.0     Urinalysis: No results for input(s): "COLORURINE", "LABSPEC", "PHURINE", "GLUCOSEU", "HGBUR", "BILIRUBINUR", "KETONESUR", "PROTEINUR", "UROBILINOGEN", "NITRITE", "LEUKOCYTESUR" in the last 72 hours.  Invalid input(s): "APPERANCEUR"    Imaging: No results found.   Medications:    piperacillin-tazobactam (ZOSYN)  IV      allopurinol  100 mg Oral Daily   vitamin C  500 mg Oral BID   atorvastatin  10 mg Oral Daily   bromocriptine  2.5 mg Oral QHS   calcium acetate  2,001 mg Oral TID WC   Chlorhexidine Gluconate Cloth  6 each Topical Q0600   cinacalcet  30 mg Oral Daily   clopidogrel  75 mg Oral Daily   docusate sodium  200 mg Oral BID   feeding supplement (NEPRO CARB STEADY)  237 mL Oral TID BM   guaiFENesin  600 mg Oral BID   heparin  5,000 Units Subcutaneous Q8H    insulin aspart  0-6 Units Subcutaneous TID WC   insulin glargine-yfgn  10 Units Subcutaneous QHS   multivitamin  1 tablet Oral QHS   polyethylene glycol  17 g Oral Daily   traZODone  50 mg Oral QHS   zinc sulfate  220 mg Oral Daily   albuterol, amLODipine, atenolol, benzonatate, bisacodyl, calcium acetate, fentaNYL (SUBLIMAZE) injection, HYDROmorphone (DILAUDID) injection, lidocaine-prilocaine, loratadine, melatonin, ondansetron (ZOFRAN) IV, oxyCODONE-acetaminophen, polyethylene glycol  Assessment/ Plan:  [name] is a 52 y.o.  male with past medical conditions including anemia, hypertension, diabetes, PVD, sleep apnea, vitamin D deficiency, and end-stage renal disease on hemodialysis.  Patient presents to the hospital for scheduled debridement of left lower venous ulcers.  Patient currently admitted for Gangrene of lower extremity ([place]) [I96]    End stage renal disease on hemodialysis. Continue TTS schedule Next treatment scheduled for Saturday.  2. Anemia of chronic kidney disease.  Lab Results  Component Value Date   HGB 9.5 (L) 11/12/2022    Hemoglobin below desired goal.  Patient receives Mircera at outpatient clinic.     3. Secondary Hyperparathyroidism: with outpatient labs: PTH 270, phosphorus 5.0, calcium 9.2 on 11/09/22.   Lab Results  Component Value Date   CALCIUM 8.8 (L) 11/11/2022   CAION 1.14 (L) 11/12/2022   PHOS 6.3 (H) 11/11/2022    Continue calcium acetate with meals  4.  Hypertension with chronic kidney disease. Blood pressure at goal. Continue home regimen of amlodipine and atenolol.     LOS: 2 Gauri Galvao 3/15/20243:14 PM

## 2022-11-12 NOTE — Anesthesia Procedure Notes (Signed)
Procedure Name: Intubation Date/Time: 11/12/2022 10:41 AM  Performed by: Kelton Pillar, CRNAPre-anesthesia Checklist: Patient identified, Emergency Drugs available, Suction available and Patient being monitored Patient Re-evaluated:Patient Re-evaluated prior to induction Oxygen Delivery Method: Circle system utilized Preoxygenation: Pre-oxygenation with 100% oxygen Induction Type: IV induction Ventilation: Mask ventilation without difficulty Laryngoscope Size: McGraph and 4 Grade View: Grade I Tube type: Oral Tube size: 7.5 mm Number of attempts: 1 Airway Equipment and Method: Stylet and Oral airway Placement Confirmation: ETT inserted through vocal cords under direct vision, positive ETCO2, breath sounds checked- equal and bilateral and CO2 detector Secured at: 21 cm Tube secured with: Tape Dental Injury: Teeth and Oropharynx as per pre-operative assessment

## 2022-11-12 NOTE — Transfer of Care (Signed)
Immediate Anesthesia Transfer of Care Note  Patient: Katie A Cheese  Procedure(s) Performed: DEBRIDEMENT WOUND (Left: Leg Lower) APPLICATION OF WOUND VAC (Left)  Patient Location: PACU  Anesthesia Type:General  Level of Consciousness: awake, drowsy, and patient cooperative  Airway & Oxygen Therapy: Patient Spontanous Breathing and Patient connected to face mask oxygen  Post-op Assessment: Report given to RN and Post -op Vital signs reviewed and stable  Post vital signs: Reviewed and stable  Last Vitals:  Vitals Value Taken Time  BP    Temp 36.8 C 11/12/22 1205  Pulse 71 11/12/22 1207  Resp 16 11/12/22 1207  SpO2 100 % 11/12/22 1207  Vitals shown include unvalidated device data.  Last Pain:  Vitals:   11/12/22 0905  TempSrc: Temporal  PainSc: 0-No pain      Patients Stated Pain Goal: 0 ([id number])  Complications: No notable events documented.

## 2022-11-12 NOTE — Plan of Care (Signed)
  Problem: Pain Managment: Goal: General experience of comfort will improve Outcome: Progressing   Problem: Skin Integrity: Goal: Risk for impaired skin integrity will decrease Outcome: Progressing   

## 2022-11-13 DIAGNOSIS — I96 Gangrene, not elsewhere classified: Secondary | ICD-10-CM | POA: Diagnosis not present

## 2022-11-13 LAB — RENAL FUNCTION PANEL
Albumin: 3 g/dL — ABNORMAL LOW (ref 3.5–5.0)
Anion gap: 10 (ref 5–15)
BUN: 61 mg/dL — ABNORMAL HIGH (ref 6–20)
CO2: 27 mmol/L (ref 22–32)
Calcium: 8.7 mg/dL — ABNORMAL LOW (ref 8.9–10.3)
Chloride: 98 mmol/L (ref 98–111)
Creatinine, Ser: 11.16 mg/dL — ABNORMAL HIGH (ref 0.61–1.24)
GFR, Estimated: 5 mL/min — ABNORMAL LOW (ref >60)
Glucose, Bld: 141 mg/dL — ABNORMAL HIGH (ref 70–99)
Phosphorus: 7.5 mg/dL — ABNORMAL HIGH (ref 2.5–4.6)
Potassium: 4.9 mmol/L (ref 3.5–5.1)
Sodium: 135 mmol/L (ref 135–145)

## 2022-11-13 LAB — GLUCOSE, CAPILLARY
Glucose-Capillary: 129 mg/dL — ABNORMAL HIGH (ref 70–99)
Glucose-Capillary: 149 mg/dL — ABNORMAL HIGH (ref 70–99)
Glucose-Capillary: 163 mg/dL — ABNORMAL HIGH (ref 70–99)

## 2022-11-13 LAB — CBC
HCT: 29.9 % — ABNORMAL LOW (ref 39.0–52.0)
Hemoglobin: 9.1 g/dL — ABNORMAL LOW (ref 13.0–17.0)
MCH: 31.1 pg (ref 26.0–34.0)
MCHC: 30.4 g/dL (ref 30.0–36.0)
MCV: 102 fL — ABNORMAL HIGH (ref 80.0–100.0)
Platelets: 158 10*3/uL (ref 150–400)
RBC: 2.93 MIL/uL — ABNORMAL LOW (ref 4.22–5.81)
RDW: 14.8 % (ref 11.5–15.5)
WBC: 5.3 10*3/uL (ref 4.0–10.5)
nRBC: 0 % (ref 0.0–0.2)

## 2022-11-13 NOTE — Progress Notes (Signed)
Progress Note   Patient: [name] [id number] DOB: 02/19/1971 DOA: 11/10/2022     3 DOS: the patient was seen and examined on 11/13/2022   Brief hospital course: Dawit A Cruse is a 52 y.o. male with past medical history of uncontrolled type 2 diabetes, hyperprolactinemia 2/2 pituitary microadenoma, PAD, hypertension, ESRD on HD, OSA, hyperlipidemia, who presents to the [facility] for wound debridement.  Vascular surgery requested TRH consultation due to history of uncontrolled type 2 diabetes and hypertension.   Mr. Moronta states that he has been doing well with the exception of his lower extremity wound that has been difficult to heal.  He notes that he has had cellulitis multiple times and has required debridement multiple times.  He denies any recent fevers or chills.  He does note a history of nonproductive cough and shortness of breath since having COVID approximately 6 weeks ago.  He was started on albuterol inhaler and feels that this has helped a lot.  He denies any chest pain or palpitations.  He denies any nausea, vomiting, diarrhea.     Assessment and Plan: Gangrene of lower extremity ([place]) Secondary to PAD and uncontrolled type 2 diabetes. Status post excisional debridement of 2 left lower extremity venous ulcers with placement of wound VAC dressing to both venous ulcers 03/13 Patient returned to the OR for further debridement of necrotic tissue 03/15 Optimize glycemic control Further management per vascular surgery   Hypertension Blood pressure is stable on amlodipine and atenolol     Diabetes mellitus with hyperglycemia (HCC) A1c last checked in January 2024, elevated at 8.6 %.   Patient has recently established with endocrinology.  He is currently on Lantus 10 units at bedtime.   Dexcom has been placed as well, with patient stating it has been helpful. Blood sugars are stable   End stage renal disease [facility]) - Nephrology consulted Renal replacement therapy T/TH/S    Reactive airway disease Patient states that since having COVID approximately 6 weeks ago, he has been experiencing a persistent nonproductive cough and wheezing.    Continue albuterol as needed Tessalon Perles and guaifenesin for cough   Hyperprolactinemia (HCC) - Continue home bromocriptine nightly       Morbid Obesity (BMI 46.99) Lifestyle modification and exercise has been discussed with patient in detail Complicates overall prognosis and care.             Subjective: Patient is seen and examined in the dialysis center.  Physical Exam: Vitals:   11/13/22 1030 11/13/22 1100 11/13/22 1130 11/13/22 1200  BP: (!) 154/78 (!) 149/78 (!) 161/68 (!) 163/74  Pulse: 69 75 76 74  Resp: 13 15 17 14   Temp:      TempSrc:      SpO2: 93% 96% 95%   Weight:      Height:        Vitals and nursing note reviewed.  Constitutional:      General: He is not in acute distress.    Appearance: He is obese. He is not toxic-appearing.  HENT:     Head: Normocephalic and atraumatic.     Mouth/Throat:     Mouth: Mucous membranes are moist.     Pharynx: Oropharynx is clear.  Eyes:     Conjunctiva/sclera: Conjunctivae normal.     Pupils: Pupils are equal, round, and reactive to light.  Cardiovascular:     Rate and Rhythm: Normal rate and regular rhythm.     Heart sounds: No murmur heard. Pulmonary:  Effort: Pulmonary effort is normal. No respiratory distress.     Breath sounds: Wheezing (Mild expiratory wheezing, mild, heard throughout) present. No rhonchi or rales.  Abdominal:     General: Bowel sounds are normal.     Palpations: Abdomen is soft.  Skin:    General: Skin is warm and dry.  Wound VAC on left lower extremity Neurological:     General: No focal deficit present.     Mental Status: He is alert and oriented to person, place, and time. Mental status is at baseline.  Psychiatric:        Mood and Affect: Mood normal.        Behavior: Behavior normal.    Data  Reviewed: Labs reviewed. There are no new results to review at this time.  Family Communication:   Disposition: Status is: Inpatient Remains inpatient appropriate because: Continues to require wound care  Planned Discharge Destination: Home    Time spent: 30 minutes  Author: Collier Bullock, MD 11/13/2022 12:43 PM  For on call review [email or web address].

## 2022-11-13 NOTE — Progress Notes (Signed)
  Received patient in bed to unit.   Informed consent signed and in chart.    TX duration:3.5hrs     Transported back to floor  Hand-off given to patient's nurse.   no c/o and no distress noted   Access used: LAVG Access issues: none   Total UF removed: 2.0kg Medication(s) given: none Post HD VS: stable  Post HD weight: 175.7kg     Darrol Jump  [facility]

## 2022-11-13 NOTE — Progress Notes (Signed)
[facility]  ROUNDING NOTE   Subjective:   [name] is a 52 year old male with past medical conditions including anemia, hypertension, diabetes, PVD, sleep apnea, vitamin D deficiency, and end-stage renal disease on hemodialysis.  Patient presents to the hospital for scheduled debridement of left lower venous ulcers.  Patient currently admitted for Gangrene of lower extremity ([place]) [I96]  Patient seen and evaluated during dialysis   HEMODIALYSIS FLOWSHEET:  Blood Flow Rate (mL/min): 400 mL/min Arterial Pressure (mmHg): -250 mmHg Venous Pressure (mmHg): 250 mmHg TMP (mmHg): 11 mmHg Ultrafiltration Rate (mL/min): 829 mL/min Dialysate Flow Rate (mL/min): 300 ml/min  Denies pain or discomfort from procedure   Objective:  Vital signs in last 24 hours:  Temp:  [97.9 F (36.6 C)-98.8 F (37.1 C)] 98.1 F (36.7 C) (03/16 0938) Pulse Rate:  [69-90] 75 (03/16 1100) Resp:  [13-20] 15 (03/16 1100) BP: (124-161)/(64-96) 149/78 (03/16 1100) SpO2:  [90 %-100 %] 96 % (03/16 1100) Weight:  [177.2 kg-180.8 kg] 177.2 kg (03/16 0942)  Weight change: 3.532 kg Filed Weights   11/12/22 0905 11/13/22 0500 11/13/22 0942  Weight: (!) 180.5 kg (!) 180.8 kg (!) 177.2 kg    Intake/Output: I/O last 3 completed shifts: In: 942.9 [P.O.:480; I.V.:200; IV Piggyback:262.9] Out: 100 [Drains:100]   Intake/Output this shift:  No intake/output data recorded.  Physical Exam: General: NAD, laying in bed  Head: Normocephalic, atraumatic. Moist oral mucosal membranes  Eyes: Anicteric  Lungs:  Clear to auscultation, normal effort, room air  Heart: Regular rate and rhythm  Abdomen:  Soft, nontender, obese  Extremities:  Left wound vacs  Neurologic: Nonfocal, moving all four extremities  Skin: No lesions  Access: Lt AVG    Basic Metabolic Panel: Recent Labs  Lab 11/10/22 1122 11/10/22 1127 11/11/22 0811 11/12/22 0914 11/13/22 0953  NA 137 140 134* 137 135  K 4.8 4.9 5.3* 4.5  4.9  CL 101 102 101 98 98  CO2 26  --  25  --  27  GLUCOSE 116* 110* 124* 105* 141*  BUN 42* 38* 52* 41* 61*  CREATININE 8.92* 10.70* 10.75* 10.50* 11.16*  CALCIUM 9.4  --  8.8*  --  8.7*  PHOS  --   --  6.3*  --  7.5*     Liver Function Tests: Recent Labs  Lab 11/11/22 0811 11/13/22 0953  ALBUMIN 3.0* 3.0*    No results for input(s): "LIPASE", "AMYLASE" in the last 168 hours. No results for input(s): "AMMONIA" in the last 168 hours.  CBC: Recent Labs  Lab 11/10/22 1122 11/10/22 1127 11/11/22 0811 11/12/22 0914 11/13/22 0953  WBC 6.6  --  5.7  --  5.3  NEUTROABS 4.4  --   --   --   --   HGB 10.5* 11.6* 8.8* 9.5* 9.1*  HCT 34.0* 34.0* 29.0* 28.0* 29.9*  MCV 99.7  --  101.4*  --  102.0*  PLT 191  --  153  --  158     Cardiac Enzymes: No results for input(s): "CKTOTAL", "CKMB", "CKMBINDEX", "TROPONINI" in the last 168 hours.  BNP: Invalid input(s): "POCBNP"  CBG: Recent Labs  Lab 11/12/22 0854 11/12/22 1207 11/12/22 1611 11/12/22 2129 11/13/22 0816  GLUCAP 105* 100* 139* 112* 129*     Microbiology: Results for orders placed or performed during the hospital encounter of 11/10/22  Aerobic/Anaerobic Culture w Gram Stain (surgical/deep wound)     Status: None (Preliminary result)   Collection Time: 11/10/22 12:45 PM   Specimen:  Leg, Left; Wound  Result Value Ref Range Status   Specimen Description   Final    LEG LEFT CALF WOUND Performed at [facility], [address] [place], [place] [address]    Special Requests   Final    NONE Performed at [facility], [address] [place], [place] [address]    Gram Stain   Final    NO WBC SEEN RARE GRAM NEGATIVE RODS Performed at [facility], [place] [address] [place], [place] [address]    Culture   Final    FEW ESCHERICHIA COLI RARE ENTEROCOCCUS FAECALIS NO ANAEROBES ISOLATED; CULTURE IN PROGRESS FOR 5 DAYS    Report Status PENDING  Incomplete   Organism ID, Bacteria  ESCHERICHIA COLI  Final      Susceptibility   Escherichia coli - MIC*    AMPICILLIN 4 SENSITIVE Sensitive     CEFEPIME <=0.12 SENSITIVE Sensitive     CEFTAZIDIME <=1 SENSITIVE Sensitive     CEFTRIAXONE <=0.25 SENSITIVE Sensitive     CIPROFLOXACIN <=0.25 SENSITIVE Sensitive     GENTAMICIN <=1 SENSITIVE Sensitive     IMIPENEM <=0.25 SENSITIVE Sensitive     TRIMETH/SULFA >=320 RESISTANT Resistant     AMPICILLIN/SULBACTAM <=2 SENSITIVE Sensitive     PIP/TAZO <=4 SENSITIVE Sensitive     * FEW ESCHERICHIA COLI  Aerobic/Anaerobic Culture w Gram Stain (surgical/deep wound)     Status: None (Preliminary result)   Collection Time: 11/10/22  1:00 PM   Specimen: Foot, Left; Wound  Result Value Ref Range Status   Specimen Description   Final    FOOT LEFT FOOT Performed at [facility] [address], [place], [place] [address]    Special Requests   Final    NONE Performed at [facility], [address] [place], [place] [address]    Gram Stain   Final    NO WBC SEEN NO ORGANISMS SEEN Performed at [facility], [place] [address], [place], [place] [address]    Culture   Final    RARE GRAM NEGATIVE RODS RARE SERRATIA MARCESCENS CULTURE REINCUBATED FOR BETTER GROWTH NO ANAEROBES ISOLATED; CULTURE IN PROGRESS FOR 5 DAYS    Report Status PENDING  Incomplete    Coagulation Studies: Recent Labs    11/12/22 2040  LABPROT 13.6  INR 1.1     Urinalysis: No results for input(s): "COLORURINE", "LABSPEC", "PHURINE", "GLUCOSEU", "HGBUR", "BILIRUBINUR", "KETONESUR", "PROTEINUR", "UROBILINOGEN", "NITRITE", "LEUKOCYTESUR" in the last 72 hours.  Invalid input(s): "APPERANCEUR"    Imaging: No results found.   Medications:    piperacillin-tazobactam (ZOSYN)  IV 2.25 g (11/13/22 0646)    allopurinol  100 mg Oral Daily   apixaban  5 mg Oral BID   vitamin C  500 mg Oral BID   atorvastatin  10 mg Oral Daily   bromocriptine  2.5 mg Oral QHS   calcium acetate   2,001 mg Oral TID WC   Chlorhexidine Gluconate Cloth  6 each Topical Q0600   cinacalcet  30 mg Oral Daily   clopidogrel  75 mg Oral Daily   docusate sodium  200 mg Oral BID   feeding supplement (NEPRO CARB STEADY)  237 mL Oral TID BM   guaiFENesin  600 mg Oral BID   insulin aspart  0-6 Units Subcutaneous TID WC   insulin glargine-yfgn  10 Units Subcutaneous QHS   multivitamin  1 tablet Oral QHS   polyethylene glycol  17 g Oral Daily  traZODone  50 mg Oral QHS   zinc sulfate  220 mg Oral Daily   albuterol, amLODipine, atenolol, benzonatate, bisacodyl, calcium acetate, fentaNYL (SUBLIMAZE) injection, HYDROmorphone (DILAUDID) injection, lidocaine-prilocaine, loratadine, melatonin, ondansetron (ZOFRAN) IV, oxyCODONE-acetaminophen, polyethylene glycol  Assessment/ Plan:  Mr. KHASE KAPPEL is a 52 y.o.  male with past medical conditions including anemia, hypertension, diabetes, PVD, sleep apnea, vitamin D deficiency, and end-stage renal disease on hemodialysis.  Patient presents to the hospital for scheduled debridement of left lower venous ulcers.  Patient currently admitted for Gangrene of lower extremity ([place]) [I96]    End stage renal disease on hemodialysis. Continue TTS schedule  Receiving dialysis today, UF goal 1.5L as tolerated. Next treatment scheduled for Tuesday.   2. Anemia of chronic kidney disease.  Lab Results  Component Value Date   HGB 9.1 (L) 11/13/2022    Patient receives Mircera at outpatient clinic.   Will monitor for now.  3. Secondary Hyperparathyroidism: with outpatient labs: PTH 270, phosphorus 5.0, calcium 9.2 on 11/09/22.   Lab Results  Component Value Date   CALCIUM 8.7 (L) 11/13/2022   CAION 1.14 (L) 11/12/2022   PHOS 7.5 (H) 11/13/2022  Phosphorus continues to rise.  Continue calcium acetate with meals  4.  Hypertension with chronic kidney disease. Blood pressure at goal. Continue home regimen of amlodipine and atenolol.    Blood pressure 144/85  during dialysis.   LOS: 3 Maycel Riffe 3/16/202411:52 AM

## 2022-11-13 NOTE — Progress Notes (Signed)
1 Day Post-Op   Subjective/Chief Complaint: Doing OK. Denies pain. Concerned about Plavix, Eliquis and Coumadin dosing.   Objective: Vital signs in last 24 hours: Temp:  [97.9 F (36.6 C)-98.8 F (37.1 C)] 97.9 F (36.6 C) (03/16 0715) Pulse Rate:  [70-90] 77 (03/16 0715) Resp:  [14-20] 14 (03/16 0715) BP: (124-149)/(64-83) 131/64 (03/16 0715) SpO2:  [90 %-100 %] 92 % (03/16 0715) Weight:  [180.5 kg-180.8 kg] 180.8 kg (03/16 0500) Last BM Date : 11/11/22  Intake/Output from previous day: 03/15 0701 - 03/16 0700 In: 702.9 [P.O.:240; I.V.:200; IV Piggyback:262.9] Out: 0  Intake/Output this shift: No intake/output data recorded.  General appearance: alert and no distress Cardio: regular rate and rhythm Extremities: edema bilateral lower extremities. Left- VAC in place with good seal/suction, minimal drainage in canister, soft, nontender  Lab Results:  Recent Labs    11/10/22 1122 11/10/22 1127 11/11/22 0811 11/12/22 0914  WBC 6.6  --  5.7  --   HGB 10.5*   < > 8.8* 9.5*  HCT 34.0*   < > 29.0* 28.0*  PLT 191  --  153  --    < > = values in this interval not displayed.   BMET Recent Labs    11/10/22 1122 11/10/22 1127 11/11/22 0811 11/12/22 0914  NA 137   < > 134* 137  K 4.8   < > 5.3* 4.5  CL 101   < > 101 98  CO2 26  --  25  --   GLUCOSE 116*   < > 124* 105*  BUN 42*   < > 52* 41*  CREATININE 8.92*   < > 10.75* 10.50*  CALCIUM 9.4  --  8.8*  --    < > = values in this interval not displayed.   PT/INR Recent Labs    11/10/22 1122 11/12/22 2040  LABPROT 13.6 13.6  INR 1.0 1.1   ABG No results for input(s): "PHART", "HCO3" in the last 72 hours.  Invalid input(s): "PCO2", "PO2"  Studies/Results: No results found.  Anti-infectives: Anti-infectives (From admission, onward)    Start     Dose/Rate Route Frequency Ordered Stop   11/12/22 1400  piperacillin-tazobactam (ZOSYN) IVPB 4.5 g  Status:  Discontinued       Note to Pharmacy: First dose STAT    4.5 g 200 mL/hr over 30 Minutes Intravenous Every 8 hours 11/12/22 1212 11/12/22 1222   11/12/22 1400  piperacillin-tazobactam (ZOSYN) IVPB 3.375 g  Status:  Discontinued        3.375 g 12.5 mL/hr over 240 Minutes Intravenous Every 8 hours 11/12/22 1222 11/12/22 1236   11/12/22 1400  piperacillin-tazobactam (ZOSYN) IVPB 2.25 g        2.25 g 100 mL/hr over 30 Minutes Intravenous Every 8 hours 11/12/22 1236     11/12/22 1004  ceFAZolin (ANCEF) 1-4 GM/50ML-% IVPB       Note to Pharmacy: Olena Mater F: cabinet override      11/12/22 1004 11/12/22 1308   11/12/22 0901  ceFAZolin (ANCEF) 2-4 GM/100ML-% IVPB       Note to Pharmacy: [facility] C: cabinet override      11/12/22 0901 11/12/22 1100   11/11/22 2217  ceFAZolin (ANCEF) IVPB 2g/100 mL premix        2 g 200 mL/hr over 30 Minutes Intravenous 30 min pre-op 11/11/22 2217 11/12/22 1100   11/10/22 1105  ceFAZolin (ANCEF) 2-4 GM/100ML-% IVPB       Note to Pharmacy: Bobby Rumpf,  Cindy P: cabinet override      11/10/22 1105 11/10/22 1223   11/10/22 0600  ceFAZolin (ANCEF) IVPB 2g/100 mL premix        2 g 200 mL/hr over 30 Minutes Intravenous On call to O.R. 11/09/22 2215 11/10/22 1249       Assessment/Plan: s/p Procedure(s): DEBRIDEMENT WOUND (Left) APPLICATION OF WOUND VAC (Left) POD #1 Change VAC- Monday/Tuesday Awaiting final result for cultures/sensitivities. Continue Zosyn/Vanco HD as scheduled Will discuss with PCP regarding Coumadin  LOS: 3 days    Jamesetta So A 11/13/2022

## 2022-11-14 DIAGNOSIS — I96 Gangrene, not elsewhere classified: Secondary | ICD-10-CM | POA: Diagnosis not present

## 2022-11-14 LAB — GLUCOSE, CAPILLARY
Glucose-Capillary: 148 mg/dL — ABNORMAL HIGH (ref 70–99)
Glucose-Capillary: 148 mg/dL — ABNORMAL HIGH (ref 70–99)
Glucose-Capillary: 125 mg/dL — ABNORMAL HIGH (ref 70–99)

## 2022-11-14 MED ORDER — ORAL CARE MOUTH RINSE: 15.0000 mL | OROMUCOSAL | Status: DC | PRN

## 2022-11-14 NOTE — Progress Notes (Signed)
Progress Note   Patient: [name] [id number] DOB: 01-Apr-1971 DOA: 11/10/2022     4 DOS: the patient was seen and examined on 11/14/2022   Brief hospital course: [name] is a 52 y.o. male with past medical history of uncontrolled type 2 diabetes, hyperprolactinemia 2/2 pituitary microadenoma, PAD, hypertension, ESRD on HD, OSA, hyperlipidemia, who presents to the [facility] for wound debridement.  Vascular surgery requested TRH consultation due to history of uncontrolled type 2 diabetes and hypertension.   [name] states that he has been doing well with the exception of his lower extremity wound that has been difficult to heal.  He notes that he has had cellulitis multiple times and has required debridement multiple times.  He denies any recent fevers or chills.  He does note a history of nonproductive cough and shortness of breath since having COVID approximately 6 weeks ago.  He was started on albuterol inhaler and feels that this has helped a lot.  He denies any chest pain or palpitations.  He denies any nausea, vomiting, diarrhea.      Assessment and Plan:  Gangrene of lower extremity ([place]) Secondary to PAD and uncontrolled type 2 diabetes. Status post excisional debridement of 2 left lower extremity venous ulcers with placement of wound VAC dressing to both venous ulcers 03/13 Patient returned to the OR for further debridement of necrotic tissue 03/15 Optimize glycemic control Further management per vascular surgery   Hypertension Blood pressure is stable on amlodipine and atenolol     Diabetes mellitus with hyperglycemia (HCC) A1c last checked in January 2024, elevated at 8.6 %.   Patient has recently established with endocrinology.  He is currently on Lantus 10 units at bedtime.   Blood sugars are stable   End stage renal disease [facility]) - Nephrology consulted Renal replacement therapy T/TH/S   Reactive airway disease Patient states that since having COVID  approximately 6 weeks ago, he has been experiencing a persistent nonproductive cough and wheezing.    Continue albuterol as needed Tessalon Perles and guaifenesin for cough   Hyperprolactinemia (HCC) - Per patient he was told by his endocrinologist to discontinue bromocriptine since his prolactin levels have normalized. -Will hold bromocriptine for now -Patient to follow-up with endocrinology as an out patient       Morbid Obesity (BMI 46.99) Lifestyle modification and exercise has been discussed with patient in detail Complicates overall prognosis and care.         Subjective: Patient is seen and examined at the bedside.  No new complaints.  Has not had a bowel movement since this admission.  Physical Exam: Vitals:   11/13/22 1629 11/13/22 1943 11/14/22 0427 11/14/22 0806  BP: (!) 140/77 (!) 144/72 136/67 (!) 137/59  Pulse: 79 71 78 71  Resp: 16 19 18 18   Temp: 97.9 F (36.6 C) 97.7 F (36.5 C) 98.2 F (36.8 C) 98.3 F (36.8 C)  TempSrc: Oral   Oral  SpO2: 97% 94% 92% 93%  Weight:      Height:        Vitals and nursing note reviewed.  Constitutional:      General: He is not in acute distress.    Appearance: He is obese. He is not toxic-appearing.  HENT:     Head: Normocephalic and atraumatic.     Mouth/Throat:     Mouth: Mucous membranes are moist.     Pharynx: Oropharynx is clear.  Eyes:     Conjunctiva/sclera: Conjunctivae normal.  Pupils: Pupils are equal, round, and reactive to light.  Cardiovascular:     Rate and Rhythm: Normal rate and regular rhythm.     Heart sounds: No murmur heard. Pulmonary:     Effort: Pulmonary effort is normal. No respiratory distress.     Breath sounds: Clear to auscultation bilaterally.  Abdominal:     General: Bowel sounds are normal.     Palpations: Abdomen is soft.  Skin:    General: Skin is warm and dry.  Wound VAC on left lower extremity Neurological:     General: No focal deficit present.     Mental Status: He  is alert and oriented to person, place, and time. Mental status is at baseline.  Psychiatric:        Mood and Affect: Mood normal.        Behavior: Behavior normal.    Data Reviewed:  There are no new results to review at this time.  Family Communication:   Disposition: Status is: Inpatient Remains inpatient appropriate because: Vascular surgery plans further wound debridement  Planned Discharge Destination: Home    Time spent: 30  minutes  Author: Collier Bullock, MD 11/14/2022 9:44 AM  For on call review [email or web address].

## 2022-11-14 NOTE — Progress Notes (Signed)
[facility]  ROUNDING NOTE   Subjective:   [name] is a 52 year old male with past medical conditions including anemia, hypertension, diabetes, PVD, sleep apnea, vitamin D deficiency, and end-stage renal disease on hemodialysis.  Patient presents to the hospital for scheduled debridement of left lower venous ulcers.  Patient currently admitted for Gangrene of lower extremity ([place]) [I96]  Patient seen resting in bed, mother at bedside Alert and oriented States pain well-controlled with prescribed medications  Dialysis received yesterday, tolerated well Trace lower extremity edema on the right side Wound VACs remains in place at left lower extremity   Objective:  Vital signs in last 24 hours:  Temp:  [97.5 F (36.4 C)-98.3 F (36.8 C)] 98.3 F (36.8 C) (03/17 0806) Pulse Rate:  [65-79] 71 (03/17 0806) Resp:  [14-20] 18 (03/17 0806) BP: (136-163)/(59-90) 137/59 (03/17 0806) SpO2:  [92 %-99 %] 93 % (03/17 0806) Weight:  [175.7 kg] 175.7 kg (03/16 1354)  Weight change: -3.332 kg Filed Weights   11/13/22 0500 11/13/22 0942 11/13/22 1354  Weight: (!) 180.8 kg (!) 177.2 kg (!) 175.7 kg    Intake/Output: I/O last 3 completed shifts: In: -  Out: 2000 [Other:2000]   Intake/Output this shift:  No intake/output data recorded.  Physical Exam: General: NAD, laying in bed  Head: Normocephalic, atraumatic. Moist oral mucosal membranes  Eyes: Anicteric  Lungs:  Clear to auscultation, normal effort, room air  Heart: Regular rate and rhythm  Abdomen:  Soft, nontender, obese  Extremities:  Left wound vacs, trace RLE peripheral edema  Neurologic: Nonfocal, moving all four extremities  Skin: No lesions  Access: Lt AVG    Basic Metabolic Panel: Recent Labs  Lab 11/10/22 1122 11/10/22 1127 11/11/22 0811 11/12/22 0914 11/13/22 0953  NA 137 140 134* 137 135  K 4.8 4.9 5.3* 4.5 4.9  CL 101 102 101 98 98  CO2 26  --  25  --  27  GLUCOSE 116* 110* 124* 105*  141*  BUN 42* 38* 52* 41* 61*  CREATININE 8.92* 10.70* 10.75* 10.50* 11.16*  CALCIUM 9.4  --  8.8*  --  8.7*  PHOS  --   --  6.3*  --  7.5*     Liver Function Tests: Recent Labs  Lab 11/11/22 0811 11/13/22 0953  ALBUMIN 3.0* 3.0*    No results for input(s): "LIPASE", "AMYLASE" in the last 168 hours. No results for input(s): "AMMONIA" in the last 168 hours.  CBC: Recent Labs  Lab 11/10/22 1122 11/10/22 1127 11/11/22 0811 11/12/22 0914 11/13/22 0953  WBC 6.6  --  5.7  --  5.3  NEUTROABS 4.4  --   --   --   --   HGB 10.5* 11.6* 8.8* 9.5* 9.1*  HCT 34.0* 34.0* 29.0* 28.0* 29.9*  MCV 99.7  --  101.4*  --  102.0*  PLT 191  --  153  --  158     Cardiac Enzymes: No results for input(s): "CKTOTAL", "CKMB", "CKMBINDEX", "TROPONINI" in the last 168 hours.  BNP: Invalid input(s): "POCBNP"  CBG: Recent Labs  Lab 11/12/22 2129 11/13/22 0816 11/13/22 1711 11/13/22 2155 11/14/22 0802  GLUCAP 112* 129* 149* 163* 148*     Microbiology: Results for orders placed or performed during the hospital encounter of 11/10/22  Aerobic/Anaerobic Culture w Gram Stain (surgical/deep wound)     Status: None (Preliminary result)   Collection Time: 11/10/22 12:45 PM   Specimen: Leg, Left; Wound  Result Value Ref Range Status  Specimen Description   Final    LEG LEFT CALF WOUND Performed at [facility], [address], [place], [place] [address]    Special Requests   Final    NONE Performed at [facility], [address] [place], [place] [address]    Gram Stain   Final    NO WBC SEEN RARE GRAM NEGATIVE RODS Performed at [facility], [place] [address], [place], [place] [address]    Culture   Final    FEW ESCHERICHIA COLI RARE ENTEROCOCCUS FAECALIS SUSCEPTIBILITIES TO FOLLOW NO ANAEROBES ISOLATED; CULTURE IN PROGRESS FOR 5 DAYS    Report Status PENDING  Incomplete   Organism ID, Bacteria ESCHERICHIA COLI  Final      Susceptibility   Escherichia  coli - MIC*    AMPICILLIN 4 SENSITIVE Sensitive     CEFEPIME <=0.12 SENSITIVE Sensitive     CEFTAZIDIME <=1 SENSITIVE Sensitive     CEFTRIAXONE <=0.25 SENSITIVE Sensitive     CIPROFLOXACIN <=0.25 SENSITIVE Sensitive     GENTAMICIN <=1 SENSITIVE Sensitive     IMIPENEM <=0.25 SENSITIVE Sensitive     TRIMETH/SULFA >=320 RESISTANT Resistant     AMPICILLIN/SULBACTAM <=2 SENSITIVE Sensitive     PIP/TAZO <=4 SENSITIVE Sensitive     * FEW ESCHERICHIA COLI  Aerobic/Anaerobic Culture w Gram Stain (surgical/deep wound)     Status: None (Preliminary result)   Collection Time: 11/10/22  1:00 PM   Specimen: Foot, Left; Wound  Result Value Ref Range Status   Specimen Description   Final    FOOT LEFT FOOT Performed at [facility], [address] [place], [place] [address]    Special Requests   Final    NONE Performed at [facility], [address] [place], [place] [address]    Gram Stain   Final    NO WBC SEEN NO ORGANISMS SEEN Performed at [facility], [place] [address], [place], [place] [address]    Culture   Final    RARE ESCHERICHIA COLI RARE SERRATIA MARCESCENS RARE ENTEROCOCCUS FAECALIS SUSCEPTIBILITIES TO FOLLOW NO ANAEROBES ISOLATED; CULTURE IN PROGRESS FOR 5 DAYS    Report Status PENDING  Incomplete   Organism ID, Bacteria ESCHERICHIA COLI  Final   Organism ID, Bacteria SERRATIA MARCESCENS  Final      Susceptibility   Escherichia coli - MIC*    AMPICILLIN <=2 SENSITIVE Sensitive     CEFEPIME <=0.12 SENSITIVE Sensitive     CEFTAZIDIME <=1 SENSITIVE Sensitive     CEFTRIAXONE <=0.25 SENSITIVE Sensitive     CIPROFLOXACIN <=0.25 SENSITIVE Sensitive     GENTAMICIN <=1 SENSITIVE Sensitive     IMIPENEM <=0.25 SENSITIVE Sensitive     TRIMETH/SULFA <=20 SENSITIVE Sensitive     AMPICILLIN/SULBACTAM <=2 SENSITIVE Sensitive     PIP/TAZO <=4 SENSITIVE Sensitive     * RARE ESCHERICHIA COLI   Serratia marcescens - MIC*    CEFEPIME <=0.12 SENSITIVE Sensitive      CEFTAZIDIME <=1 SENSITIVE Sensitive     CEFTRIAXONE <=0.25 SENSITIVE Sensitive     CIPROFLOXACIN <=0.25 SENSITIVE Sensitive     GENTAMICIN <=1 SENSITIVE Sensitive     TRIMETH/SULFA <=20 SENSITIVE Sensitive     * RARE SERRATIA MARCESCENS    Coagulation Studies: Recent Labs    11/12/22 2040  LABPROT 13.6  INR 1.1     Urinalysis: No results for input(s): "COLORURINE", "LABSPEC", "PHURINE", "GLUCOSEU", "HGBUR", "BILIRUBINUR", "KETONESUR", "PROTEINUR", "UROBILINOGEN", "NITRITE", "LEUKOCYTESUR" in the last 72 hours.  Invalid input(s): "APPERANCEUR"    Imaging: No results found.   Medications:    piperacillin-tazobactam (ZOSYN)  IV 2.25 g (11/14/22 0925)    allopurinol  100 mg Oral Daily   apixaban  5 mg Oral BID   vitamin C  500 mg Oral BID   atorvastatin  10 mg Oral Daily   calcium acetate  2,001 mg Oral TID WC   Chlorhexidine Gluconate Cloth  6 each Topical Q0600   cinacalcet  30 mg Oral Daily   clopidogrel  75 mg Oral Daily   docusate sodium  200 mg Oral BID   feeding supplement (NEPRO CARB STEADY)  237 mL Oral TID BM   guaiFENesin  600 mg Oral BID   insulin aspart  0-6 Units Subcutaneous TID WC   insulin glargine-yfgn  10 Units Subcutaneous QHS   multivitamin  1 tablet Oral QHS   polyethylene glycol  17 g Oral Daily   traZODone  50 mg Oral QHS   zinc sulfate  220 mg Oral Daily   albuterol, amLODipine, atenolol, benzonatate, bisacodyl, calcium acetate, fentaNYL (SUBLIMAZE) injection, HYDROmorphone (DILAUDID) injection, lidocaine-prilocaine, loratadine, melatonin, ondansetron (ZOFRAN) IV, mouth rinse, oxyCODONE-acetaminophen, polyethylene glycol  Assessment/ Plan:  [name] is a 52 y.o.  male with past medical conditions including anemia, hypertension, diabetes, PVD, sleep apnea, vitamin D deficiency, and end-stage renal disease on hemodialysis.  Patient presents to the hospital for scheduled debridement of left lower venous ulcers.  Patient currently  admitted for Gangrene of lower extremity ([place]) [I96]    End stage renal disease on hemodialysis. Continue TTS schedule  Dialysis received yesterday, UF goal 2 L achieved. Next treatment scheduled for Tuesday.  2. Anemia of chronic kidney disease.  Lab Results  Component Value Date   HGB 9.1 (L) 11/13/2022    Patient receives Mircera at outpatient clinic.   Hemoglobin acceptable.  3. Secondary Hyperparathyroidism: with outpatient labs: PTH 270, phosphorus 5.0, calcium 9.2 on 11/09/22.   Lab Results  Component Value Date   CALCIUM 8.7 (L) 11/13/2022   CAION 1.14 (L) 11/12/2022   PHOS 7.5 (H) 11/13/2022  Will continue to monitor bone minerals during this admission. Continue calcium acetate with meals  4.  Hypertension with chronic kidney disease. Blood pressure at goal. Continue home regimen of amlodipine and atenolol.    Blood pressure stable for this patient.  LOS: 4 Gricelda Foland 3/17/202411:19 AM

## 2022-11-14 NOTE — Progress Notes (Signed)
2 Days Post-Op   Subjective/Chief Complaint: Doing OK. Notes moderate pain in LEFT lower extremity. Controlled with current regimen. Tolerating HD.   Objective: Vital signs in last 24 hours: Temp:  [97.5 F (36.4 C)-98.3 F (36.8 C)] 98.3 F (36.8 C) (03/17 0806) Pulse Rate:  [65-79] 71 (03/17 0806) Resp:  [14-20] 18 (03/17 0806) BP: (136-163)/(59-90) 137/59 (03/17 0806) SpO2:  [92 %-99 %] 93 % (03/17 0806) Weight:  [175.7 kg] 175.7 kg (03/16 1354) Last BM Date : 11/10/22  Intake/Output from previous day: 03/16 0701 - 03/17 0700 In: -  Out: 2000  Intake/Output this shift: No intake/output data recorded.  General appearance: alert and no distress Resp: clear to auscultation bilaterally Extremities: LEFT lower extremity edema, VAC in place with good seal and suction, soft, nontender, foot soft, no erythema  Lab Results:  Recent Labs    11/12/22 0914 11/13/22 0953  WBC  --  5.3  HGB 9.5* 9.1*  HCT 28.0* 29.9*  PLT  --  158   BMET Recent Labs    11/12/22 0914 11/13/22 0953  NA 137 135  K 4.5 4.9  CL 98 98  CO2  --  27  GLUCOSE 105* 141*  BUN 41* 61*  CREATININE 10.50* 11.16*  CALCIUM  --  8.7*   PT/INR Recent Labs    11/12/22 2040  LABPROT 13.6  INR 1.1   ABG No results for input(s): "PHART", "HCO3" in the last 72 hours.  Invalid input(s): "PCO2", "PO2"  Studies/Results: No results found.  Anti-infectives: Anti-infectives (From admission, onward)    Start     Dose/Rate Route Frequency Ordered Stop   11/12/22 1400  piperacillin-tazobactam (ZOSYN) IVPB 4.5 g  Status:  Discontinued       Note to Pharmacy: First dose STAT   4.5 g 200 mL/hr over 30 Minutes Intravenous Every 8 hours 11/12/22 1212 11/12/22 1222   11/12/22 1400  piperacillin-tazobactam (ZOSYN) IVPB 3.375 g  Status:  Discontinued        3.375 g 12.5 mL/hr over 240 Minutes Intravenous Every 8 hours 11/12/22 1222 11/12/22 1236   11/12/22 1400  piperacillin-tazobactam (ZOSYN) IVPB 2.25  g        2.25 g 100 mL/hr over 30 Minutes Intravenous Every 8 hours 11/12/22 1236     11/12/22 1004  ceFAZolin (ANCEF) 1-4 GM/50ML-% IVPB       Note to Pharmacy: Olena Mater F: cabinet override      11/12/22 1004 11/12/22 1308   11/12/22 0901  ceFAZolin (ANCEF) 2-4 GM/100ML-% IVPB       Note to Pharmacy: [facility] C: cabinet override      11/12/22 0901 11/12/22 1100   11/11/22 2217  ceFAZolin (ANCEF) IVPB 2g/100 mL premix        2 g 200 mL/hr over 30 Minutes Intravenous 30 min pre-op 11/11/22 2217 11/12/22 1100   11/10/22 1105  ceFAZolin (ANCEF) 2-4 GM/100ML-% IVPB       Note to Pharmacy: Sylvester Harder P: cabinet override      11/10/22 1105 11/10/22 1223   11/10/22 0600  ceFAZolin (ANCEF) IVPB 2g/100 mL premix        2 g 200 mL/hr over 30 Minutes Intravenous On call to O.R. 11/09/22 2215 11/10/22 1249       Assessment/Plan: s/p Procedure(s): DEBRIDEMENT WOUND (Left) APPLICATION OF WOUND VAC (Left) POD #2 VAC change on Wednesday. Cultures pending. Likely Cefepime for coverage of all microbacteria, Continue Vanco/Zosyn Eliquis/Plavix  LOS: 4 days  [name] A 11/14/2022

## 2022-11-15 ENCOUNTER — Encounter: Payer: Self-pay | Admitting: Vascular Surgery

## 2022-11-15 DIAGNOSIS — I96 Gangrene, not elsewhere classified: Secondary | ICD-10-CM | POA: Diagnosis not present

## 2022-11-15 LAB — AEROBIC/ANAEROBIC CULTURE W GRAM STAIN (SURGICAL/DEEP WOUND)
Gram Stain: NONE SEEN
Gram Stain: NONE SEEN

## 2022-11-15 LAB — GLUCOSE, CAPILLARY
Glucose-Capillary: 104 mg/dL — ABNORMAL HIGH (ref 70–99)
Glucose-Capillary: 118 mg/dL — ABNORMAL HIGH (ref 70–99)
Glucose-Capillary: 121 mg/dL — ABNORMAL HIGH (ref 70–99)
Glucose-Capillary: 101 mg/dL — ABNORMAL HIGH (ref 70–99)
Glucose-Capillary: 103 mg/dL — ABNORMAL HIGH (ref 70–99)
Glucose-Capillary: 138 mg/dL — ABNORMAL HIGH (ref 70–99)

## 2022-11-15 MED ORDER — MINERAL OIL PO OIL
TOPICAL_OIL | Freq: Once | ORAL | Status: DC
  Filled 2022-11-15: qty 30

## 2022-11-15 NOTE — H&P (View-Only) (Signed)
  Progress Note    11/15/2022 1:04 PM 3 Days Post-Op  Subjective:  Delon A Coury is a 52 y.o. male with past medical history of uncontrolled type 2 diabetes, hyperprolactinemia 2/2 pituitary microadenoma, PAD, hypertension, ESRD on HD, OSA, hyperlipidemia, who presents to the [facility] for wound debridement.   Mr. Stellhorn states that he has been doing well with the exception of his lower extremity wound that has been difficult to heal.  He notes that he has had cellulitis multiple times and has required debridement multiple times.   Today upon exam patient is resting comfortably in bed. Left lower extremity noted to have wound VAC in place and working properly. Serous bloody drainage noticed to the canister. Patient has no other complaints at this time and his vitals all remained stable.   Vitals:   11/15/22 0416 11/15/22 0751  BP: (!) 145/71 133/63  Pulse: 77 65  Resp: 16 18  Temp: 97.7 F (36.5 C) 97.8 F (36.6 C)  SpO2: 92% 95%   Physical Exam: Cardiac:  RRR Lungs: Normal respiratory effort, bilateral lungs clear on auscultation. Incisions: Left lower extremity wound VAC in place working well. Extremities: Lower extremity wound VAC in place working well. Abdomen: Bowel sounds, soft, abdomen is soft nontender and nondistended. Neurologic: AAOx3, neuro intact, follows all commands appropriately.  CBC    Component Value Date/Time   WBC 5.3 11/13/2022 0953   RBC 2.93 (L) 11/13/2022 0953   HGB 9.1 (L) 11/13/2022 0953   HCT 29.9 (L) 11/13/2022 0953   PLT 158 11/13/2022 0953   MCV 102.0 (H) 11/13/2022 0953   MCH 31.1 11/13/2022 0953   MCHC 30.4 11/13/2022 0953   RDW 14.8 11/13/2022 0953   LYMPHSABS 1.3 11/10/2022 1122   MONOABS 0.6 11/10/2022 1122   EOSABS 0.2 11/10/2022 1122   BASOSABS 0.1 11/10/2022 1122    BMET    Component Value Date/Time   NA 135 11/13/2022 0953   K 4.9 11/13/2022 0953   CL 98 11/13/2022 0953   CO2 27 11/13/2022 0953   GLUCOSE 141 (H) 11/13/2022 0953    BUN 61 (H) 11/13/2022 0953   CREATININE 11.16 (H) 11/13/2022 0953   CALCIUM 8.7 (L) 11/13/2022 0953   GFRNONAA 5 (L) 11/13/2022 0953   GFRAA 9 (L) 02/19/2020 1316    INR    Component Value Date/Time   INR 1.1 11/12/2022 2040     Intake/Output Summary (Last 24 hours) at 11/15/2022 1304 Last data filed at 11/15/2022 1131 Gross per 24 hour  Intake 437.09 ml  Output --  Net 437.09 ml     Assessment/Plan:  52 y.o. male is s/p incision and drainage of left lower extremity with wound VAC placement 3 Days Post-Op   Plan: Patient is scheduled to return to the operating room on 324 for further incision and drainage with debridement of the left lower extremity.  Wound VAC will be changed at this time as well.  DVT prophylaxis: Epic 75 mg daily, heparin injection subcutaneously 5000 units every 8 hours.   Drema Pry [facility] 11/15/2022 1:04 PM

## 2022-11-15 NOTE — Consult Note (Signed)
   [facility] CM Inpatient Consult   11/15/2022  Ah A Sluis 19-Jun-1971 [id number]  Orientation with Natividad Brood, [employer] Hospital Liaison for review.   Location: [place] [facility] Liaison screened remotely [facility]).   [facility]) [place] Patient: Insurance [facility])   Primary Care Provider:  [name], MD with [facility]  Patient (pt) screened for hospitalization readmission with noted high risk score for unplanned readmission risk and 2 IP within 6 months. [facility] liaison reviewed chart and assessed for potential [facility]) Care Management service needs for post hospital transition for care coordination.    Plan:  [facility] liaison will continue to follow ongoing discharge disposition for post hospital community care coordination/management needs.   [place] does not replace or interfere with any arrangements made by the Inpatient Transition of Care team.   For questions contact:    Raina Mina, RN, [place] Hours M-F 8:00 am to 5 pm [phone] mobile [phone] [Office toll free line][facility] Office Hours are M-F 8:30 - 5 pm 24 hour nurse advise line [phone] Conceirge  [email or web address]

## 2022-11-15 NOTE — Progress Notes (Signed)
  Progress Note    11/15/2022 1:04 PM 3 Days Post-Op  Subjective:  [name] is a 52 y.o. male with past medical history of uncontrolled type 2 diabetes, hyperprolactinemia 2/2 pituitary microadenoma, PAD, hypertension, ESRD on HD, OSA, hyperlipidemia, who presents to the [facility] for wound debridement.   [name] states that he has been doing well with the exception of his lower extremity wound that has been difficult to heal.  He notes that he has had cellulitis multiple times and has required debridement multiple times.   Today upon exam patient is resting comfortably in bed. Left lower extremity noted to have wound VAC in place and working properly. Serous bloody drainage noticed to the canister. Patient has no other complaints at this time and his vitals all remained stable.   Vitals:   11/15/22 0416 11/15/22 0751  BP: (!) 145/71 133/63  Pulse: 77 65  Resp: 16 18  Temp: 97.7 F (36.5 C) 97.8 F (36.6 C)  SpO2: 92% 95%   Physical Exam: Cardiac:  RRR Lungs: Normal respiratory effort, bilateral lungs clear on auscultation. Incisions: Left lower extremity wound VAC in place working well. Extremities: Lower extremity wound VAC in place working well. Abdomen: Bowel sounds, soft, abdomen is soft nontender and nondistended. Neurologic: AAOx3, neuro intact, follows all commands appropriately.  CBC    Component Value Date/Time   WBC 5.3 11/13/2022 0953   RBC 2.93 (L) 11/13/2022 0953   HGB 9.1 (L) 11/13/2022 0953   HCT 29.9 (L) 11/13/2022 0953   PLT 158 11/13/2022 0953   MCV 102.0 (H) 11/13/2022 0953   MCH 31.1 11/13/2022 0953   MCHC 30.4 11/13/2022 0953   RDW 14.8 11/13/2022 0953   LYMPHSABS 1.3 11/10/2022 1122   MONOABS 0.6 11/10/2022 1122   EOSABS 0.2 11/10/2022 1122   BASOSABS 0.1 11/10/2022 1122    BMET    Component Value Date/Time   NA 135 11/13/2022 0953   K 4.9 11/13/2022 0953   CL 98 11/13/2022 0953   CO2 27 11/13/2022 0953   GLUCOSE 141 (H) 11/13/2022 0953    BUN 61 (H) 11/13/2022 0953   CREATININE 11.16 (H) 11/13/2022 0953   CALCIUM 8.7 (L) 11/13/2022 0953   GFRNONAA 5 (L) 11/13/2022 0953   GFRAA 9 (L) 02/19/2020 1316    INR    Component Value Date/Time   INR 1.1 11/12/2022 2040     Intake/Output Summary (Last 24 hours) at 11/15/2022 1304 Last data filed at 11/15/2022 1131 Gross per 24 hour  Intake 437.09 ml  Output --  Net 437.09 ml     Assessment/Plan:  52 y.o. male is s/p incision and drainage of left lower extremity with wound VAC placement 3 Days Post-Op   Plan: Patient is scheduled to return to the operating room on 324 for further incision and drainage with debridement of the left lower extremity.  Wound VAC will be changed at this time as well.  DVT prophylaxis: Epic 75 mg daily, heparin injection subcutaneously 5000 units every 8 hours.   Drema Pry [facility] 11/15/2022 1:04 PM

## 2022-11-15 NOTE — Progress Notes (Signed)
Progress Note   Patient: [name] [id number] DOB: 27-Jun-1971 DOA: 11/10/2022     5 DOS: the patient was seen and examined on 11/15/2022   Brief hospital course:  [name] is a 52 y.o. male with past medical history of uncontrolled type 2 diabetes, hyperprolactinemia 2/2 pituitary microadenoma, PAD, hypertension, ESRD on HD, OSA, hyperlipidemia, who presents to the [facility] for wound debridement.  Vascular surgery requested TRH consultation due to history of uncontrolled type 2 diabetes and hypertension.   [name] states that he has been doing well with the exception of his lower extremity wound that has been difficult to heal.  He notes that he has had cellulitis multiple times and has required debridement multiple times.  He denies any recent fevers or chills.  He does note a history of nonproductive cough and shortness of breath since having COVID approximately 6 weeks ago.  He was started on albuterol inhaler and feels that this has helped a lot.  He denies any chest pain or palpitations.  He denies any nausea, vomiting, diarrhea.     Assessment and Plan: Gangrene of lower extremity ([name]) Secondary to PAD and uncontrolled type 2 diabetes. Status post excisional debridement of 2 left lower extremity venous ulcers with placement of wound VAC dressing to both venous ulcers 03/13 Patient returned to the OR for further debridement of necrotic tissue 03/15 Wound cultures-yielded E. coli, Enterococcus faecalis and Serratia Marcescens Continue IV Zosyn Optimize glycemic control Further management per vascular surgery    Hypertension Blood pressure is stable on amlodipine and atenolol     Diabetes mellitus with hyperglycemia (HCC) A1c last checked in January 2024, elevated at 8.6 %.   Patient has recently established with endocrinology.  He is currently on Lantus 10 units at bedtime.   Blood sugars are stable   End stage renal disease [facility]) - Nephrology consulted Renal  replacement therapy T/TH/S   Reactive airway disease Patient states that since having COVID approximately 6 weeks ago, he has been experiencing a persistent nonproductive cough and wheezing.    Continue albuterol as needed Tessalon Perles and guaifenesin for cough   Hyperprolactinemia (HCC) - Per patient he was told by his endocrinologist to discontinue bromocriptine since his prolactin levels have normalized. -Will hold bromocriptine for now -Patient to follow-up with endocrinology as an out patient       Morbid Obesity (BMI 46.99) Lifestyle modification and exercise has been discussed with patient in detail Complicates overall prognosis and care.              Subjective: Patient is seen and examined at the bedside.  Still has not had a bowel movement.  Physical Exam: Vitals:   11/14/22 1715 11/14/22 1957 11/15/22 0416 11/15/22 0751  BP: (!) 141/68 (!) 148/66 (!) 145/71 133/63  Pulse: 70 71 77 65  Resp: 18 16 16 18   Temp: 97.7 F (36.5 C) 97.8 F (36.6 C) 97.7 F (36.5 C) 97.8 F (36.6 C)  TempSrc: Oral Oral Oral   SpO2: 98% 93% 92% 95%  Weight:      Height:       Vitals and nursing note reviewed.  Constitutional:      General: He is not in acute distress.    Appearance: He is obese. He is not toxic-appearing.  HENT:     Head: Normocephalic and atraumatic.     Mouth/Throat:     Mouth: Mucous membranes are moist.     Pharynx: Oropharynx is clear.  Eyes:     Conjunctiva/sclera: Conjunctivae normal.     Pupils: Pupils are equal, round, and reactive to light.  Cardiovascular:     Rate and Rhythm: Normal rate and regular rhythm.     Heart sounds: No murmur heard. Pulmonary:     Effort: Pulmonary effort is normal. No respiratory distress.     Breath sounds: Clear to auscultation bilaterally.  Abdominal:     General: Bowel sounds are normal.     Palpations: Abdomen is soft.  Skin:    General: Skin is warm and dry.  Wound VAC on left lower  extremity Neurological:     General: No focal deficit present.     Mental Status: He is alert and oriented to person, place, and time. Mental status is at baseline.  Psychiatric:        Mood and Affect: Mood normal.        Behavior: Behavior normal.     Data Reviewed: There are no new results to review at this time.  Family Communication:   Disposition: Status is: Inpatient Remains inpatient appropriate because: On IV antibiotics for wound infection  Planned Discharge Destination: Home    Time spent: 32 minutes  Author: Collier Bullock, MD 11/15/2022 11:51 AM  For on call review [email or web address].

## 2022-11-15 NOTE — Progress Notes (Signed)
[facility]  ROUNDING NOTE   Subjective:   [name] is a 52 year old male with past medical conditions including anemia, hypertension, diabetes, PVD, sleep apnea, vitamin D deficiency, and end-stage renal disease on hemodialysis.  Patient presents to the hospital for scheduled debridement of left lower venous ulcers.  Patient currently admitted for Gangrene of lower extremity ([place]) [I96]  Patient seen resting in bed, mother at bedside Alert and oriented States pain well-controlled with prescribed medications  Patient seen resting in bed NO family at bedside Pain well managed  Wound vacs in place   Objective:  Vital signs in last 24 hours:  Temp:  [97.7 F (36.5 C)-97.8 F (36.6 C)] 97.8 F (36.6 C) (03/18 0751) Pulse Rate:  [65-77] 65 (03/18 0751) Resp:  [16-18] 18 (03/18 0751) BP: (133-148)/(63-71) 133/63 (03/18 0751) SpO2:  [92 %-98 %] 95 % (03/18 0751)  Weight change:  Filed Weights   11/13/22 0500 11/13/22 0942 11/13/22 1354  Weight: (!) 180.8 kg (!) 177.2 kg (!) 175.7 kg    Intake/Output: I/O last 3 completed shifts: In: 387.1 [IV Piggyback:387.1] Out: -    Intake/Output this shift:  Total I/O In: 50 [IV Piggyback:50] Out: -   Physical Exam: General: NAD, laying in bed  Head: Normocephalic, atraumatic. Moist oral mucosal membranes  Eyes: Anicteric  Lungs:  Clear to auscultation, normal effort, room air  Heart: Regular rate and rhythm  Abdomen:  Soft, nontender, obese  Extremities:  Left wound vacs, trace RLE peripheral edema  Neurologic: Nonfocal, moving all four extremities  Skin: No lesions  Access: Lt AVG    Basic Metabolic Panel: Recent Labs  Lab 11/10/22 1122 11/10/22 1127 11/11/22 0811 11/12/22 0914 11/13/22 0953  NA 137 140 134* 137 135  K 4.8 4.9 5.3* 4.5 4.9  CL 101 102 101 98 98  CO2 26  --  25  --  27  GLUCOSE 116* 110* 124* 105* 141*  BUN 42* 38* 52* 41* 61*  CREATININE 8.92* 10.70* 10.75* 10.50* 11.16*   CALCIUM 9.4  --  8.8*  --  8.7*  PHOS  --   --  6.3*  --  7.5*     Liver Function Tests: Recent Labs  Lab 11/11/22 0811 11/13/22 0953  ALBUMIN 3.0* 3.0*    No results for input(s): "LIPASE", "AMYLASE" in the last 168 hours. No results for input(s): "AMMONIA" in the last 168 hours.  CBC: Recent Labs  Lab 11/10/22 1122 11/10/22 1127 11/11/22 0811 11/12/22 0914 11/13/22 0953  WBC 6.6  --  5.7  --  5.3  NEUTROABS 4.4  --   --   --   --   HGB 10.5* 11.6* 8.8* 9.5* 9.1*  HCT 34.0* 34.0* 29.0* 28.0* 29.9*  MCV 99.7  --  101.4*  --  102.0*  PLT 191  --  153  --  158     Cardiac Enzymes: No results for input(s): "CKTOTAL", "CKMB", "CKMBINDEX", "TROPONINI" in the last 168 hours.  BNP: Invalid input(s): "POCBNP"  CBG: Recent Labs  Lab 11/14/22 1142 11/14/22 1713 11/14/22 2029 11/15/22 0747 11/15/22 1208  GLUCAP 148* 125* 121* 101* 103*     Microbiology: Results for orders placed or performed during the hospital encounter of 11/10/22  Aerobic/Anaerobic Culture w Gram Stain (surgical/deep wound)     Status: None   Collection Time: 11/10/22 12:45 PM   Specimen: Leg, Left; Wound  Result Value Ref Range Status   Specimen Description   Final  LEG LEFT CALF WOUND Performed at [facility], [address] [place], [place] [address]    Special Requests   Final    NONE Performed at [facility], [place], [place] [address]    Gram Stain NO WBC SEEN RARE GRAM NEGATIVE RODS   Final   Culture   Final    FEW ESCHERICHIA COLI RARE ENTEROCOCCUS FAECALIS NO ANAEROBES ISOLATED Performed at [facility], [place] [address], [place], [place] [address]    Report Status 11/15/2022 FINAL  Final   Organism ID, Bacteria ESCHERICHIA COLI  Final   Organism ID, Bacteria ENTEROCOCCUS FAECALIS  Final      Susceptibility   Escherichia coli - MIC*    AMPICILLIN 4 SENSITIVE Sensitive     CEFEPIME <=0.12 SENSITIVE Sensitive     CEFTAZIDIME  <=1 SENSITIVE Sensitive     CEFTRIAXONE <=0.25 SENSITIVE Sensitive     CIPROFLOXACIN <=0.25 SENSITIVE Sensitive     GENTAMICIN <=1 SENSITIVE Sensitive     IMIPENEM <=0.25 SENSITIVE Sensitive     TRIMETH/SULFA >=320 RESISTANT Resistant     AMPICILLIN/SULBACTAM <=2 SENSITIVE Sensitive     PIP/TAZO <=4 SENSITIVE Sensitive     * FEW ESCHERICHIA COLI   Enterococcus faecalis - MIC*    AMPICILLIN <=2 SENSITIVE Sensitive     VANCOMYCIN 1 SENSITIVE Sensitive     GENTAMICIN SYNERGY SENSITIVE Sensitive     LINEZOLID 2 SENSITIVE Sensitive     * RARE ENTEROCOCCUS FAECALIS  Aerobic/Anaerobic Culture w Gram Stain (surgical/deep wound)     Status: None   Collection Time: 11/10/22  1:00 PM   Specimen: Foot, Left; Wound  Result Value Ref Range Status   Specimen Description   Final    FOOT LEFT FOOT Performed at [facility], [address], [place], [place] [address]    Special Requests   Final    NONE Performed at [facility], [place], [place] [address]    Gram Stain NO WBC SEEN NO ORGANISMS SEEN   Final   Culture   Final    RARE ESCHERICHIA COLI RARE SERRATIA MARCESCENS RARE ENTEROCOCCUS FAECALIS NO ANAEROBES ISOLATED Performed at [facility], [address] [place], [place] [address]    Report Status 11/15/2022 FINAL  Final   Organism ID, Bacteria ESCHERICHIA COLI  Final   Organism ID, Bacteria SERRATIA MARCESCENS  Final   Organism ID, Bacteria ENTEROCOCCUS FAECALIS  Final      Susceptibility   Escherichia coli - MIC*    AMPICILLIN <=2 SENSITIVE Sensitive     CEFEPIME <=0.12 SENSITIVE Sensitive     CEFTAZIDIME <=1 SENSITIVE Sensitive     CEFTRIAXONE <=0.25 SENSITIVE Sensitive     CIPROFLOXACIN <=0.25 SENSITIVE Sensitive     GENTAMICIN <=1 SENSITIVE Sensitive     IMIPENEM <=0.25 SENSITIVE Sensitive     TRIMETH/SULFA <=20 SENSITIVE Sensitive     AMPICILLIN/SULBACTAM <=2 SENSITIVE Sensitive     PIP/TAZO <=4 SENSITIVE Sensitive     * RARE  ESCHERICHIA COLI   Enterococcus faecalis - MIC*    AMPICILLIN <=2 SENSITIVE Sensitive     VANCOMYCIN 1 SENSITIVE Sensitive     GENTAMICIN SYNERGY SENSITIVE Sensitive     LINEZOLID 2 SENSITIVE Sensitive     * RARE ENTEROCOCCUS FAECALIS   Serratia marcescens - MIC*    CEFEPIME <=0.12 SENSITIVE Sensitive     CEFTAZIDIME <=1 SENSITIVE Sensitive     CEFTRIAXONE <=0.25 SENSITIVE Sensitive  CIPROFLOXACIN <=0.25 SENSITIVE Sensitive     GENTAMICIN <=1 SENSITIVE Sensitive     TRIMETH/SULFA <=20 SENSITIVE Sensitive     * RARE SERRATIA MARCESCENS    Coagulation Studies: Recent Labs    11/12/22 23-Dec-2038  LABPROT 13.6  INR 1.1     Urinalysis: No results for input(s): "COLORURINE", "LABSPEC", "PHURINE", "GLUCOSEU", "HGBUR", "BILIRUBINUR", "KETONESUR", "PROTEINUR", "UROBILINOGEN", "NITRITE", "LEUKOCYTESUR" in the last 72 hours.  Invalid input(s): "APPERANCEUR"    Imaging: No results found.   Medications:    piperacillin-tazobactam (ZOSYN)  IV Stopped (11/15/22 1005)    allopurinol  100 mg Oral Daily   apixaban  5 mg Oral BID   vitamin C  500 mg Oral BID   atorvastatin  10 mg Oral Daily   calcium acetate  2,001 mg Oral TID WC   Chlorhexidine Gluconate Cloth  6 each Topical Q0600   cinacalcet  30 mg Oral Daily   clopidogrel  75 mg Oral Daily   docusate sodium  200 mg Oral BID   feeding supplement (NEPRO CARB STEADY)  237 mL Oral TID BM   guaiFENesin  600 mg Oral BID   insulin aspart  0-6 Units Subcutaneous TID WC   insulin glargine-yfgn  10 Units Subcutaneous QHS   mineral oil   Oral Once   multivitamin  1 tablet Oral QHS   polyethylene glycol  17 g Oral Daily   traZODone  50 mg Oral QHS   zinc sulfate  220 mg Oral Daily   albuterol, amLODipine, atenolol, benzonatate, bisacodyl, calcium acetate, fentaNYL (SUBLIMAZE) injection, HYDROmorphone (DILAUDID) injection, lidocaine-prilocaine, loratadine, melatonin, ondansetron (ZOFRAN) IV, mouth rinse, oxyCODONE-acetaminophen,  polyethylene glycol  Assessment/ Plan:  [name] is a 52 y.o.  male with past medical conditions including anemia, hypertension, diabetes, PVD, sleep apnea, vitamin D deficiency, and end-stage renal disease on hemodialysis.  Patient presents to the hospital for scheduled debridement of left lower venous ulcers.  Patient currently admitted for Gangrene of lower extremity ([place]) [I96]    End stage renal disease on hemodialysis. Continue TTS schedule  Next treatment scheduled for Tuesday.  2. Anemia of chronic kidney disease.  Lab Results  Component Value Date   HGB 9.1 (L) 11/13/2022    Patient receives Mircera at outpatient clinic.   Hemoglobin at goal  3. Secondary Hyperparathyroidism: with outpatient labs: PTH 270, phosphorus 5.0, calcium 9.2 on 11/09/22.   Lab Results  Component Value Date   CALCIUM 8.7 (L) 11/13/2022   CAION 1.14 (L) 11/12/2022   PHOS 7.5 (H) 11/13/2022  Will continue to monitor bone minerals during this admission. Continue calcium acetate with meals  4.  Hypertension with chronic kidney disease. Blood pressure at goal. Continue home regimen of amlodipine and atenolol.    Blood pressure stable for this patient.   LOS: [place] 3/18/20241:03 PM

## 2022-11-15 NOTE — Progress Notes (Signed)
Patient up to restroom and when back to bed wound vac alarmed low pressure.  Suction pad to foot appeared clogged so suction pad replaced and reinforced with tegaderm to ensure seal.  Seal check green on machine and suction at 125.  Will continue to monitor

## 2022-11-15 NOTE — Progress Notes (Signed)
Canister of wound vac changed due to being full.  Patient up to restroom and vac seal on foot reduced.  Reinforced area and vac began functioning appropriately.

## 2022-11-16 DIAGNOSIS — I96 Gangrene, not elsewhere classified: Secondary | ICD-10-CM | POA: Diagnosis not present

## 2022-11-16 LAB — CBC
HCT: 30.7 % — ABNORMAL LOW (ref 39.0–52.0)
Hemoglobin: 9.5 g/dL — ABNORMAL LOW (ref 13.0–17.0)
MCH: 30.8 pg (ref 26.0–34.0)
MCHC: 30.9 g/dL (ref 30.0–36.0)
MCV: 99.7 fL (ref 80.0–100.0)
Platelets: 170 10*3/uL (ref 150–400)
RBC: 3.08 MIL/uL — ABNORMAL LOW (ref 4.22–5.81)
RDW: 15 % (ref 11.5–15.5)
WBC: 5.8 10*3/uL (ref 4.0–10.5)
nRBC: 0 % (ref 0.0–0.2)

## 2022-11-16 LAB — RENAL FUNCTION PANEL
Albumin: 3.1 g/dL — ABNORMAL LOW (ref 3.5–5.0)
Anion gap: 13 (ref 5–15)
BUN: 68 mg/dL — ABNORMAL HIGH (ref 6–20)
CO2: 26 mmol/L (ref 22–32)
Calcium: 9.3 mg/dL (ref 8.9–10.3)
Chloride: 99 mmol/L (ref 98–111)
Creatinine, Ser: 13.1 mg/dL — ABNORMAL HIGH (ref 0.61–1.24)
GFR, Estimated: 4 mL/min — ABNORMAL LOW (ref >60)
Glucose, Bld: 99 mg/dL (ref 70–99)
Phosphorus: 7.2 mg/dL — ABNORMAL HIGH (ref 2.5–4.6)
Potassium: 4.8 mmol/L (ref 3.5–5.1)
Sodium: 138 mmol/L (ref 135–145)

## 2022-11-16 LAB — GLUCOSE, CAPILLARY
Glucose-Capillary: 126 mg/dL — ABNORMAL HIGH (ref 70–99)
Glucose-Capillary: 127 mg/dL — ABNORMAL HIGH (ref 70–99)

## 2022-11-16 MED ORDER — CHLORHEXIDINE GLUCONATE CLOTH 2 % EX PADS
6.0000 | MEDICATED_PAD | Freq: Once | CUTANEOUS | Status: AC
  Administered 2022-11-16: 6 via TOPICAL

## 2022-11-16 NOTE — Progress Notes (Signed)
Pt's Wound Vac again alarmed-showing that there was a leak in system. RN attempted to reinforce, reposition, and reset system. Suction pad had already been replaced and reinforced with Tegaderm. Wound Vac could still not seal properly.   On call Vascular MD, [name], secure messaged and made aware of situation. Advised to removed Wound Vac and apply a wet to dry dressing. This Designer, multimedia removed Wound Vac and placed a new wet to dry dressing.

## 2022-11-16 NOTE — Progress Notes (Signed)
[facility]  ROUNDING NOTE   Subjective:   [name] is a 52 year old male with past medical conditions including anemia, hypertension, diabetes, PVD, sleep apnea, vitamin D deficiency, and end-stage renal disease on hemodialysis.  Patient presents to the hospital for scheduled debridement of left lower venous ulcers.  Patient currently admitted for Gangrene of lower extremity ([place]) [I96]  Patient seen and evaluated during dialysis   HEMODIALYSIS FLOWSHEET:  Blood Flow Rate (mL/min): 400 mL/min Arterial Pressure (mmHg): -160 mmHg Venous Pressure (mmHg): 180 mmHg TMP (mmHg): 10 mmHg Ultrafiltration Rate (mL/min): 829 mL/min Dialysate Flow Rate (mL/min): 300 ml/min  No complaints at this time Wound vacs removed.    Objective:  Vital signs in last 24 hours:  Temp:  [97.7 F (36.5 C)-98.4 F (36.9 C)] 98.2 F (36.8 C) (03/19 0759) Pulse Rate:  [65-75] 75 (03/19 1030) Resp:  [14-27] 23 (03/19 1030) BP: (133-177)/(55-88) 139/74 (03/19 1030) SpO2:  [93 %-99 %] 99 % (03/19 1030) Weight:  [178.2 kg] 178.2 kg (03/19 0754)  Weight change:  Filed Weights   11/13/22 0942 11/13/22 1354 11/16/22 0754  Weight: (!) 177.2 kg (!) 175.7 kg (!) 178.2 kg    Intake/Output: I/O last 3 completed shifts: In: 437.1 [IV Piggyback:437.1] Out: -    Intake/Output this shift:  No intake/output data recorded.  Physical Exam: General: NAD, laying in bed  Head: Normocephalic, atraumatic. Moist oral mucosal membranes  Eyes: Anicteric  Lungs:  Clear to auscultation, normal effort, room air  Heart: Regular rate and rhythm  Abdomen:  Soft, nontender, obese  Extremities: trace RLE peripheral edema  Neurologic: Nonfocal, moving all four extremities  Skin: No lesions  Access: Lt AVG    Basic Metabolic Panel: Recent Labs  Lab 11/10/22 1122 11/10/22 1127 11/11/22 0811 11/12/22 0914 11/13/22 0953 11/16/22 0810  NA 137 140 134* 137 135 138  K 4.8 4.9 5.3* 4.5 4.9 4.8  CL  101 102 101 98 98 99  CO2 26  --  25  --  27 26  GLUCOSE 116* 110* 124* 105* 141* 99  BUN 42* 38* 52* 41* 61* 68*  CREATININE 8.92* 10.70* 10.75* 10.50* 11.16* 13.10*  CALCIUM 9.4  --  8.8*  --  8.7* 9.3  PHOS  --   --  6.3*  --  7.5* 7.2*     Liver Function Tests: Recent Labs  Lab 11/11/22 0811 11/13/22 0953 11/16/22 0810  ALBUMIN 3.0* 3.0* 3.1*    No results for input(s): "LIPASE", "AMYLASE" in the last 168 hours. No results for input(s): "AMMONIA" in the last 168 hours.  CBC: Recent Labs  Lab 11/10/22 1122 11/10/22 1127 11/11/22 0811 11/12/22 0914 11/13/22 0953 11/16/22 0810  WBC 6.6  --  5.7  --  5.3 5.8  NEUTROABS 4.4  --   --   --   --   --   HGB 10.5* 11.6* 8.8* 9.5* 9.1* 9.5*  HCT 34.0* 34.0* 29.0* 28.0* 29.9* 30.7*  MCV 99.7  --  101.4*  --  102.0* 99.7  PLT 191  --  153  --  158 170     Cardiac Enzymes: No results for input(s): "CKTOTAL", "CKMB", "CKMBINDEX", "TROPONINI" in the last 168 hours.  BNP: Invalid input(s): "POCBNP"  CBG: Recent Labs  Lab 11/14/22 1713 11/14/22 2029 11/15/22 0747 11/15/22 1208 11/15/22 2128  GLUCAP 125* 121* 101* 103* 138*     Microbiology: Results for orders placed or performed during the hospital encounter of 11/10/22  Aerobic/Anaerobic Culture  w Gram Stain (surgical/deep wound)     Status: None   Collection Time: 11/10/22 12:45 PM   Specimen: Leg, Left; Wound  Result Value Ref Range Status   Specimen Description   Final    LEG LEFT CALF WOUND Performed at [facility], [address] [place], [place] [address]    Special Requests   Final    NONE Performed at [facility], [address] [place], [place] [address]    Gram Stain NO WBC SEEN RARE GRAM NEGATIVE RODS   Final   Culture   Final    FEW ESCHERICHIA COLI RARE ENTEROCOCCUS FAECALIS NO ANAEROBES ISOLATED Performed at [facility], [address] [place], [place] [address]    Report Status 11/15/2022 FINAL  Final    Organism ID, Bacteria ESCHERICHIA COLI  Final   Organism ID, Bacteria ENTEROCOCCUS FAECALIS  Final      Susceptibility   Escherichia coli - MIC*    AMPICILLIN 4 SENSITIVE Sensitive     CEFEPIME <=0.12 SENSITIVE Sensitive     CEFTAZIDIME <=1 SENSITIVE Sensitive     CEFTRIAXONE <=0.25 SENSITIVE Sensitive     CIPROFLOXACIN <=0.25 SENSITIVE Sensitive     GENTAMICIN <=1 SENSITIVE Sensitive     IMIPENEM <=0.25 SENSITIVE Sensitive     TRIMETH/SULFA >=320 RESISTANT Resistant     AMPICILLIN/SULBACTAM <=2 SENSITIVE Sensitive     PIP/TAZO <=4 SENSITIVE Sensitive     * FEW ESCHERICHIA COLI   Enterococcus faecalis - MIC*    AMPICILLIN <=2 SENSITIVE Sensitive     VANCOMYCIN 1 SENSITIVE Sensitive     GENTAMICIN SYNERGY SENSITIVE Sensitive     LINEZOLID 2 SENSITIVE Sensitive     * RARE ENTEROCOCCUS FAECALIS  Aerobic/Anaerobic Culture w Gram Stain (surgical/deep wound)     Status: None   Collection Time: 11/10/22  1:00 PM   Specimen: Foot, Left; Wound  Result Value Ref Range Status   Specimen Description   Final    FOOT LEFT FOOT Performed at [facility], [address], [place], [place] [address]    Special Requests   Final    NONE Performed at [facility], [place], [place] [address]    Gram Stain NO WBC SEEN NO ORGANISMS SEEN   Final   Culture   Final    RARE ESCHERICHIA COLI RARE SERRATIA MARCESCENS RARE ENTEROCOCCUS FAECALIS NO ANAEROBES ISOLATED Performed at [facility], [address], [place], [place] [address]    Report Status 11/15/2022 FINAL  Final   Organism ID, Bacteria ESCHERICHIA COLI  Final   Organism ID, Bacteria SERRATIA MARCESCENS  Final   Organism ID, Bacteria ENTEROCOCCUS FAECALIS  Final      Susceptibility   Escherichia coli - MIC*    AMPICILLIN <=2 SENSITIVE Sensitive     CEFEPIME <=0.12 SENSITIVE Sensitive     CEFTAZIDIME <=1 SENSITIVE Sensitive     CEFTRIAXONE <=0.25 SENSITIVE Sensitive     CIPROFLOXACIN  <=0.25 SENSITIVE Sensitive     GENTAMICIN <=1 SENSITIVE Sensitive     IMIPENEM <=0.25 SENSITIVE Sensitive     TRIMETH/SULFA <=20 SENSITIVE Sensitive     AMPICILLIN/SULBACTAM <=2 SENSITIVE Sensitive     PIP/TAZO <=4 SENSITIVE Sensitive     * RARE ESCHERICHIA COLI   Enterococcus faecalis - MIC*    AMPICILLIN <=2 SENSITIVE Sensitive     VANCOMYCIN 1 SENSITIVE Sensitive     GENTAMICIN SYNERGY SENSITIVE Sensitive     LINEZOLID 2  SENSITIVE Sensitive     * RARE ENTEROCOCCUS FAECALIS   Serratia marcescens - MIC*    CEFEPIME <=0.12 SENSITIVE Sensitive     CEFTAZIDIME <=1 SENSITIVE Sensitive     CEFTRIAXONE <=0.25 SENSITIVE Sensitive     CIPROFLOXACIN <=0.25 SENSITIVE Sensitive     GENTAMICIN <=1 SENSITIVE Sensitive     TRIMETH/SULFA <=20 SENSITIVE Sensitive     * RARE SERRATIA MARCESCENS    Coagulation Studies: No results for input(s): "LABPROT", "INR" in the last 72 hours.   Urinalysis: No results for input(s): "COLORURINE", "LABSPEC", "PHURINE", "GLUCOSEU", "HGBUR", "BILIRUBINUR", "KETONESUR", "PROTEINUR", "UROBILINOGEN", "NITRITE", "LEUKOCYTESUR" in the last 72 hours.  Invalid input(s): "APPERANCEUR"    Imaging: No results found.   Medications:    piperacillin-tazobactam (ZOSYN)  IV 2.25 g (11/16/22 0039)    allopurinol  100 mg Oral Daily   apixaban  5 mg Oral BID   vitamin C  500 mg Oral BID   atorvastatin  10 mg Oral Daily   calcium acetate  2,001 mg Oral TID WC   Chlorhexidine Gluconate Cloth  6 each Topical Q0600   cinacalcet  30 mg Oral Daily   clopidogrel  75 mg Oral Daily   docusate sodium  200 mg Oral BID   feeding supplement (NEPRO CARB STEADY)  237 mL Oral TID BM   guaiFENesin  600 mg Oral BID   insulin aspart  0-6 Units Subcutaneous TID WC   insulin glargine-yfgn  10 Units Subcutaneous QHS   mineral oil   Oral Once   multivitamin  1 tablet Oral QHS   polyethylene glycol  17 g Oral Daily   traZODone  50 mg Oral QHS   zinc sulfate  220 mg Oral Daily    albuterol, amLODipine, atenolol, benzonatate, bisacodyl, calcium acetate, fentaNYL (SUBLIMAZE) injection, HYDROmorphone (DILAUDID) injection, lidocaine-prilocaine, loratadine, melatonin, ondansetron (ZOFRAN) IV, mouth rinse, oxyCODONE-acetaminophen, polyethylene glycol  Assessment/ Plan:  [name] is a 52 y.o.  male with past medical conditions including anemia, hypertension, diabetes, PVD, sleep apnea, vitamin D deficiency, and end-stage renal disease on hemodialysis.  Patient presents to the hospital for scheduled debridement of left lower venous ulcers.  Patient currently admitted for Gangrene of lower extremity ([place]) [I96]    End stage renal disease on hemodialysis. Continue TTS schedule  Receiving dialysis, UF goal 2L as tolerated.  Pre-dialysis weight 178, below EDW Next treatment scheduled for Tuesday  2. Anemia of chronic kidney disease.  Lab Results  Component Value Date   HGB 9.5 (L) 11/16/2022    Patient receives Mircera at outpatient clinic.   Hemoglobin within desired goal.   3. Secondary Hyperparathyroidism: with outpatient labs: PTH 270, phosphorus 5.0, calcium 9.2 on 11/09/22.   Lab Results  Component Value Date   CALCIUM 9.3 11/16/2022   CAION 1.14 (L) 11/12/2022   PHOS 7.2 (H) 11/16/2022  Will continue to monitor bone minerals during this admission. Continue calcium acetate with meals  4.  Hypertension with chronic kidney disease. Blood pressure at goal. Continue home regimen of amlodipine and atenolol.    Blood pressure 145/80 during dialysis   LOS: 6 Gid [place] 3/19/202411:09 AM

## 2022-11-16 NOTE — Progress Notes (Signed)
  Received patient in bed to unit.   Informed consent signed and in chart.    TX duration:3.5hrs     Transported back to floor  Hand-off given to patient's nurse.  No c/o and no distress noted    Access used: LAVG Access issues: none   Total UF removed: 2.0kg Medication(s) given: none Post HD VS: stable Post HD weight: 176.1     Darrol Jump LPN [facility] D[facility] Unit

## 2022-11-16 NOTE — Progress Notes (Signed)
Progress Note   [name]: [name] [id number] DOB: 12-Feb-1971 DOA: 11/10/2022     6 DOS: the [name] was seen and examined on 11/16/2022   Brief hospital course:  [name] is a 52 y.o. male with past medical history of uncontrolled type 2 diabetes, hyperprolactinemia 2/2 pituitary microadenoma, PAD, hypertension, ESRD on HD, OSA, hyperlipidemia, who presents to the [facility] for wound debridement.  Vascular surgery requested TRH consultation due to history of uncontrolled type 2 diabetes and hypertension.   [name] states that [name] has been doing well with the exception of his lower extremity wound that has been difficult to heal.  [name] notes that [name] has had cellulitis multiple times and has required debridement multiple times.  [name] denies any recent fevers or chills.  [name] does note a history of nonproductive cough and shortness of breath since having COVID approximately 6 weeks ago.  [name] was started on albuterol inhaler and feels that this has helped a lot.  [name] denies any chest pain or palpitations.  [name] denies any nausea, vomiting, diarrhea.    Assessment and Plan:  Gangrene of lower extremity ([place]) Secondary to PAD and uncontrolled type 2 diabetes. Status post excisional debridement of 2 left lower extremity venous ulcers with placement of wound VAC dressing to both venous ulcers 03/13 [name] returned to the OR for further debridement of necrotic tissue 03/15 Wound cultures-yielded E. coli, Enterococcus faecalis and Serratia Marcescens Continue IV Zosyn Optimize glycemic control Further management per vascular surgery     Hypertension Blood pressure is stable on amlodipine and atenolol     Diabetes mellitus with hyperglycemia (HCC) A1c last checked in January 2024, elevated at 8.6 %.   [name] has recently established with endocrinology.  [name] is currently on Lantus 10 units at bedtime.   Blood sugars are stable   End stage renal disease [facility]) - Nephrology consulted Renal  replacement therapy T/TH/S   Reactive airway disease [name] states that since having COVID approximately 6 weeks ago, [name] has been experiencing a persistent nonproductive cough and wheezing.    Continue albuterol as needed Tessalon Perles and guaifenesin for cough   Hyperprolactinemia (HCC) - Per [name] [name] was told by his endocrinologist to discontinue bromocriptine since his prolactin levels have normalized. -Will hold bromocriptine for now -[name] to follow-up with endocrinology as an out [name]       Morbid Obesity (BMI 46.99) Lifestyle modification and exercise has been discussed with [name] in detail Complicates overall prognosis and care.               Subjective: [name] is seen and examined at bedside.  Events of last night noted, issues with wound VAC overnight concerning for leak.  Wound VAC removed and wet-to-dry dressing applied over wounds.  Has had several bowel movements.  Physical Exam: Vitals:   11/16/22 0545 11/16/22 0754 11/16/22 0759 11/16/22 0808  BP: (!) 146/69  (!) 155/69 (!) 152/88  Pulse: 70  71 65  Resp: 18  18 17   Temp: 98.4 F (36.9 C)  98.2 F (36.8 C)   TempSrc: Oral  Oral   SpO2: 94%  98% 96%  Weight:  (!) 178.2 kg    Height:       Vitals and nursing note reviewed.  Constitutional:      General: [name] is not in acute distress.    Appearance: [name] is obese. [name] is not toxic-appearing.  HENT:     Head: Normocephalic and atraumatic.  Mouth/Throat:     Mouth: Mucous membranes are moist.     Pharynx: Oropharynx is clear.  Eyes:     Conjunctiva/sclera: Conjunctivae normal.     Pupils: Pupils are equal, round, and reactive to light.  Cardiovascular:     Rate and Rhythm: Normal rate and regular rhythm.     Heart sounds: No murmur heard. Pulmonary:     Effort: Pulmonary effort is normal. No respiratory distress.     Breath sounds: Clear to auscultation bilaterally.  Abdominal:     General: Bowel sounds are normal.     Palpations:  Abdomen is soft.  Skin:    General: Skin is warm and dry.  Dressing over left lower extremity Neurological:     General: No focal deficit present.     Mental Status: [name] is alert and oriented to person, place, and time. Mental status is at baseline.  Psychiatric:        Mood and Affect: Mood normal.        Behavior: Behavior normal.    Data Reviewed: Labs reviewed. There are no new results to review at this time.  Family Communication:   Disposition: Status is: Inpatient Remains inpatient appropriate because: For OR for further wound debridement and wound VAC placement  Planned Discharge Destination: Home    Time spent: 32 minutes  Author: Collier Bullock, MD 11/16/2022 8:16 AM  For on call review [email or web address].

## 2022-11-17 ENCOUNTER — Inpatient Hospital Stay: Payer: Medicare HMO | Admitting: Anesthesiology

## 2022-11-17 ENCOUNTER — Encounter
Admission: RE | Disposition: A | Discharge status: Home Health | Payer: Self-pay | Source: Home / Self Care | Attending: Vascular Surgery

## 2022-11-17 ENCOUNTER — Other Ambulatory Visit: Payer: Self-pay

## 2022-11-17 ENCOUNTER — Encounter: Payer: Self-pay | Admitting: Vascular Surgery

## 2022-11-17 DIAGNOSIS — N186 End stage renal disease: Secondary | ICD-10-CM | POA: Diagnosis not present

## 2022-11-17 DIAGNOSIS — Z992 Dependence on renal dialysis: Secondary | ICD-10-CM | POA: Diagnosis not present

## 2022-11-17 DIAGNOSIS — E11622 Type 2 diabetes mellitus with other skin ulcer: Secondary | ICD-10-CM

## 2022-11-17 DIAGNOSIS — I10 Essential (primary) hypertension: Secondary | ICD-10-CM | POA: Diagnosis not present

## 2022-11-17 DIAGNOSIS — I96 Gangrene, not elsewhere classified: Secondary | ICD-10-CM | POA: Diagnosis not present

## 2022-11-17 DIAGNOSIS — Z794 Long term (current) use of insulin: Secondary | ICD-10-CM | POA: Diagnosis not present

## 2022-11-17 DIAGNOSIS — L97529 Non-pressure chronic ulcer of other part of left foot with unspecified severity: Secondary | ICD-10-CM | POA: Diagnosis not present

## 2022-11-17 DIAGNOSIS — L97329 Non-pressure chronic ulcer of left ankle with unspecified severity: Secondary | ICD-10-CM | POA: Diagnosis not present

## 2022-11-17 DIAGNOSIS — E1122 Type 2 diabetes mellitus with diabetic chronic kidney disease: Secondary | ICD-10-CM | POA: Diagnosis not present

## 2022-11-17 DIAGNOSIS — L97909 Non-pressure chronic ulcer of unspecified part of unspecified lower leg with unspecified severity: Secondary | ICD-10-CM | POA: Diagnosis not present

## 2022-11-17 HISTORY — PX: INCISION AND DRAINAGE OF WOUND: SHX1803

## 2022-11-17 HISTORY — PX: APPLICATION OF WOUND VAC: SHX5189

## 2022-11-17 LAB — GLUCOSE, CAPILLARY
Glucose-Capillary: 129 mg/dL — ABNORMAL HIGH (ref 70–99)
Glucose-Capillary: 145 mg/dL — ABNORMAL HIGH (ref 70–99)
Glucose-Capillary: 113 mg/dL — ABNORMAL HIGH (ref 70–99)
Glucose-Capillary: 101 mg/dL — ABNORMAL HIGH (ref 70–99)
Glucose-Capillary: 97 mg/dL (ref 70–99)
Glucose-Capillary: 121 mg/dL — ABNORMAL HIGH (ref 70–99)

## 2022-11-17 LAB — COMPREHENSIVE METABOLIC PANEL
ALT: <5 U/L (ref 0–44)
AST: 17 U/L (ref 15–41)
Albumin: 3.1 g/dL — ABNORMAL LOW (ref 3.5–5.0)
Alkaline Phosphatase: 44 U/L (ref 38–126)
Anion gap: 7 (ref 5–15)
BUN: 50 mg/dL — ABNORMAL HIGH (ref 6–20)
CO2: 28 mmol/L (ref 22–32)
Calcium: 8.9 mg/dL (ref 8.9–10.3)
Chloride: 101 mmol/L (ref 98–111)
Creatinine, Ser: 10.26 mg/dL — ABNORMAL HIGH (ref 0.61–1.24)
GFR, Estimated: 6 mL/min — ABNORMAL LOW (ref >60)
Glucose, Bld: 151 mg/dL — ABNORMAL HIGH (ref 70–99)
Potassium: 4.4 mmol/L (ref 3.5–5.1)
Sodium: 136 mmol/L (ref 135–145)
Total Bilirubin: 0.9 mg/dL (ref 0.3–1.2)
Total Protein: 6.9 g/dL (ref 6.5–8.1)

## 2022-11-17 LAB — CBC
HCT: 29.4 % — ABNORMAL LOW (ref 39.0–52.0)
Hemoglobin: 9.2 g/dL — ABNORMAL LOW (ref 13.0–17.0)
MCH: 31 pg (ref 26.0–34.0)
MCHC: 31.3 g/dL (ref 30.0–36.0)
MCV: 99 fL (ref 80.0–100.0)
Platelets: 172 10*3/uL (ref 150–400)
RBC: 2.97 MIL/uL — ABNORMAL LOW (ref 4.22–5.81)
RDW: 14.9 % (ref 11.5–15.5)
WBC: 4.7 10*3/uL (ref 4.0–10.5)
nRBC: 0 % (ref 0.0–0.2)

## 2022-11-17 SURGERY — IRRIGATION AND DEBRIDEMENT WOUND
Anesthesia: General | Site: Leg Lower | Laterality: Left

## 2022-11-17 MED ORDER — ALBUTEROL SULFATE HFA 108 (90 BASE) MCG/ACT IN AERS
INHALATION_SPRAY | RESPIRATORY_TRACT | Status: AC
  Filled 2022-11-17: qty 6.7

## 2022-11-17 MED ORDER — SODIUM CHLORIDE 0.9 % IV SOLN: INTRAVENOUS | Status: DC

## 2022-11-17 MED ORDER — SUCCINYLCHOLINE CHLORIDE 200 MG/10ML IV SOSY
PREFILLED_SYRINGE | INTRAVENOUS | Status: AC
  Filled 2022-11-17: qty 10

## 2022-11-17 MED ORDER — PHENYLEPHRINE HCL (PRESSORS) 10 MG/ML IV SOLN
INTRAVENOUS | Status: DC | PRN
  Administered 2022-11-17: 160 ug via INTRAVENOUS
  Administered 2022-11-17: 80 ug via INTRAVENOUS
  Administered 2022-11-17: 160 ug via INTRAVENOUS
  Administered 2022-11-17: 80 ug via INTRAVENOUS

## 2022-11-17 MED ORDER — PROPOFOL 10 MG/ML IV BOLUS
INTRAVENOUS | Status: AC
  Filled 2022-11-17: qty 20

## 2022-11-17 MED ORDER — ATENOLOL 50 MG PO TABS: 50.0000 mg | ORAL_TABLET | ORAL | Status: DC

## 2022-11-17 MED ORDER — LACTATED RINGERS IV SOLN: INTRAVENOUS | Status: DC

## 2022-11-17 MED ORDER — ALBUTEROL SULFATE HFA 108 (90 BASE) MCG/ACT IN AERS
INHALATION_SPRAY | RESPIRATORY_TRACT | Status: DC | PRN
  Administered 2022-11-17: 2 via RESPIRATORY_TRACT

## 2022-11-17 MED ORDER — ACETAMINOPHEN 10 MG/ML IV SOLN
INTRAVENOUS | Status: DC | PRN
  Administered 2022-11-17: 1000 mg via INTRAVENOUS

## 2022-11-17 MED ORDER — FENTANYL CITRATE (PF) 100 MCG/2ML IJ SOLN
INTRAMUSCULAR | Status: DC | PRN
  Administered 2022-11-17: 75 ug via INTRAVENOUS

## 2022-11-17 MED ORDER — ACETAMINOPHEN 10 MG/ML IV SOLN: INTRAVENOUS | Status: DC | PRN

## 2022-11-17 MED ORDER — ACETAMINOPHEN 10 MG/ML IV SOLN
INTRAVENOUS | Status: AC
  Filled 2022-11-17: qty 100

## 2022-11-17 MED ORDER — AMLODIPINE BESYLATE 5 MG PO TABS
5.0000 mg | ORAL_TABLET | Freq: Every day | ORAL | Status: DC
  Filled 2022-11-17: qty 1

## 2022-11-17 MED ORDER — FENTANYL CITRATE PF 50 MCG/ML IJ SOSY: 12.5000 ug | PREFILLED_SYRINGE | Freq: Once | INTRAMUSCULAR | Status: DC | PRN

## 2022-11-17 MED ORDER — LIDOCAINE HCL (PF) 2 % IJ SOLN
INTRAMUSCULAR | Status: AC
  Filled 2022-11-17: qty 5

## 2022-11-17 MED ORDER — ONDANSETRON HCL 4 MG/2ML IJ SOLN: 4.0000 mg | Freq: Once | INTRAMUSCULAR | Status: DC | PRN

## 2022-11-17 MED ORDER — CIPROFLOXACIN IN D5W 400 MG/200ML IV SOLN
400.0000 mg | Freq: Every day | INTRAVENOUS | Status: DC
  Administered 2022-11-17 – 2022-11-24 (×8): 400 mg via INTRAVENOUS
  Filled 2022-11-17 (×9): qty 200

## 2022-11-17 MED ORDER — LABETALOL HCL 5 MG/ML IV SOLN
INTRAVENOUS | Status: AC
  Filled 2022-11-17: qty 4

## 2022-11-17 MED ORDER — LIDOCAINE HCL (CARDIAC) PF 100 MG/5ML IV SOSY
PREFILLED_SYRINGE | INTRAVENOUS | Status: DC | PRN
  Administered 2022-11-17: 100 mg via INTRAVENOUS

## 2022-11-17 MED ORDER — MIDAZOLAM HCL 2 MG/2ML IJ SOLN
INTRAMUSCULAR | Status: AC
  Filled 2022-11-17: qty 2

## 2022-11-17 MED ORDER — FENTANYL CITRATE (PF) 100 MCG/2ML IJ SOLN
INTRAMUSCULAR | Status: AC
  Administered 2022-11-17: 25 ug via INTRAVENOUS
  Filled 2022-11-17: qty 2

## 2022-11-17 MED ORDER — ONDANSETRON HCL 4 MG/2ML IJ SOLN
INTRAMUSCULAR | Status: AC
  Filled 2022-11-17: qty 2

## 2022-11-17 MED ORDER — MIDAZOLAM HCL 2 MG/ML PO SYRP: 8.0000 mg | ORAL_SOLUTION | Freq: Once | ORAL | Status: DC | PRN

## 2022-11-17 MED ORDER — FENTANYL CITRATE (PF) 100 MCG/2ML IJ SOLN
INTRAMUSCULAR | Status: AC
  Filled 2022-11-17: qty 2

## 2022-11-17 MED ORDER — SUCCINYLCHOLINE CHLORIDE 200 MG/10ML IV SOSY
PREFILLED_SYRINGE | INTRAVENOUS | Status: DC | PRN
  Administered 2022-11-17: 120 mg via INTRAVENOUS

## 2022-11-17 MED ORDER — PHENYLEPHRINE HCL-NACL 20-0.9 MG/250ML-% IV SOLN
INTRAVENOUS | Status: DC | PRN
  Administered 2022-11-17: 30 ug/min via INTRAVENOUS

## 2022-11-17 MED ORDER — PROPOFOL 10 MG/ML IV BOLUS
INTRAVENOUS | Status: DC | PRN
  Administered 2022-11-17: 200 mg via INTRAVENOUS

## 2022-11-17 MED ORDER — FENTANYL CITRATE (PF) 100 MCG/2ML IJ SOLN
25.0000 ug | INTRAMUSCULAR | Status: DC | PRN
  Administered 2022-11-17 (×3): 25 ug via INTRAVENOUS

## 2022-11-17 MED ORDER — CEFAZOLIN SODIUM-DEXTROSE 2-4 GM/100ML-% IV SOLN: 2.0000 g | INTRAVENOUS | Status: DC

## 2022-11-17 MED ORDER — HYDROMORPHONE HCL 1 MG/ML IJ SOLN: 1.0000 mg | Freq: Once | INTRAMUSCULAR | Status: DC | PRN

## 2022-11-17 MED ORDER — OXYCODONE HCL 5 MG/5ML PO SOLN: 5.0000 mg | Freq: Once | ORAL | Status: DC | PRN

## 2022-11-17 MED ORDER — SODIUM CHLORIDE 0.9 % IV SOLN
3.0000 g | Freq: Two times a day (BID) | INTRAVENOUS | Status: DC
  Administered 2022-11-17 – 2022-11-25 (×15): 3 g via INTRAVENOUS
  Filled 2022-11-17 (×4): qty 3
  Filled 2022-11-17: qty 8
  Filled 2022-11-17 (×2): qty 3
  Filled 2022-11-17: qty 8
  Filled 2022-11-17 (×6): qty 3
  Filled 2022-11-17: qty 8

## 2022-11-17 MED ORDER — EPHEDRINE SULFATE (PRESSORS) 50 MG/ML IJ SOLN
INTRAMUSCULAR | Status: DC | PRN
  Administered 2022-11-17: 5 mg via INTRAVENOUS

## 2022-11-17 MED ORDER — ONDANSETRON HCL 4 MG/2ML IJ SOLN: 4.0000 mg | Freq: Four times a day (QID) | INTRAMUSCULAR | Status: DC | PRN

## 2022-11-17 MED ORDER — 0.9 % SODIUM CHLORIDE (POUR BTL) OPTIME
TOPICAL | Status: DC | PRN
  Administered 2022-11-17: 1000 mL

## 2022-11-17 MED ORDER — METHYLPREDNISOLONE SODIUM SUCC 125 MG IJ SOLR: 125.0000 mg | Freq: Once | INTRAMUSCULAR | Status: DC | PRN

## 2022-11-17 MED ORDER — PROPOFOL 10 MG/ML IV BOLUS
INTRAVENOUS | Status: AC
  Filled 2022-11-17: qty 40

## 2022-11-17 MED ORDER — ATENOLOL 25 MG PO TABS
25.0000 mg | ORAL_TABLET | Freq: Every day | ORAL | Status: DC
  Filled 2022-11-17: qty 1

## 2022-11-17 MED ORDER — ACETAMINOPHEN 10 MG/ML IV SOLN: 1000.0000 mg | Freq: Once | INTRAVENOUS | Status: DC | PRN

## 2022-11-17 MED ORDER — DIPHENHYDRAMINE HCL 50 MG/ML IJ SOLN: 50.0000 mg | Freq: Once | INTRAMUSCULAR | Status: DC | PRN

## 2022-11-17 MED ORDER — MIDAZOLAM HCL 2 MG/2ML IJ SOLN
INTRAMUSCULAR | Status: DC | PRN
  Administered 2022-11-17: 2 mg via INTRAVENOUS

## 2022-11-17 MED ORDER — AMLODIPINE BESYLATE 10 MG PO TABS: 10.0000 mg | ORAL_TABLET | ORAL | Status: DC

## 2022-11-17 MED ORDER — FAMOTIDINE 20 MG PO TABS
40.0000 mg | ORAL_TABLET | Freq: Once | ORAL | Status: AC | PRN
  Administered 2022-11-20: 40 mg via ORAL
  Filled 2022-11-17: qty 2

## 2022-11-17 MED ORDER — OXYCODONE HCL 5 MG PO TABS: 5.0000 mg | ORAL_TABLET | Freq: Once | ORAL | Status: DC | PRN

## 2022-11-17 SURGICAL SUPPLY — 42 items
AGENT HMST PWDR BTL CLGN 5GM (Miscellaneous) ×1 IMPLANT
APL PRP STRL LF DISP 70% ISPRP (MISCELLANEOUS)
BAG ISL LRG 20X20 DRWSTRG (DRAPES) ×1
BAG ISOLATATION DRAPE 20X20 ST (DRAPES) IMPLANT
BNDG CMPR 5X6 CHSV STRCH STRL (GAUZE/BANDAGES/DRESSINGS)
BNDG CMPR 75X21 PLY HI ABS (MISCELLANEOUS)
BNDG COHESIVE 6X5 TAN ST LF (GAUZE/BANDAGES/DRESSINGS) ×1 IMPLANT
CANISTER WOUND CARE 500ML ATS (WOUND CARE) IMPLANT
CHLORAPREP W/TINT 26 (MISCELLANEOUS) ×1 IMPLANT
COLLAGEN CELLERATERX 5 GRAM (Miscellaneous) IMPLANT
CONNECTOR Y WND VAC (MISCELLANEOUS) IMPLANT
DRAPE EXTREMITY 106X87X128.5 (DRAPES) IMPLANT
DRAPE INCISE IOBAN 66X45 STRL (DRAPES) IMPLANT
DRAPE ISOLATE BAG 20X20 STRL (DRAPES) ×1
DRSG EMULSION OIL 3X3 NADH (GAUZE/BANDAGES/DRESSINGS) IMPLANT
DRSG EMULSION OIL 3X8 NADH (GAUZE/BANDAGES/DRESSINGS) IMPLANT
DRSG VAC GRANUFOAM LG (GAUZE/BANDAGES/DRESSINGS) IMPLANT
DRSG VAC GRANUFOAM MED (GAUZE/BANDAGES/DRESSINGS) IMPLANT
ELECT REM PT RETURN 9FT ADLT (ELECTROSURGICAL) ×1
ELECTRODE REM PT RTRN 9FT ADLT (ELECTROSURGICAL) ×1 IMPLANT
GAUZE SPONGE 4X4 12PLY STRL (GAUZE/BANDAGES/DRESSINGS) IMPLANT
GAUZE STRETCH 2X75IN STRL (MISCELLANEOUS) IMPLANT
GLOVE BIO SURGEON STRL SZ7 (GLOVE) ×1 IMPLANT
GLOVE SURG SYN 8.0 (GLOVE) ×1 IMPLANT
GLOVE SURG SYN 8.0 PF PI (GLOVE) ×1 IMPLANT
GOWN STRL REUS W/ TWL LRG LVL3 (GOWN DISPOSABLE) ×2 IMPLANT
GOWN STRL REUS W/ TWL XL LVL3 (GOWN DISPOSABLE) ×1 IMPLANT
GOWN STRL REUS W/TWL LRG LVL3 (GOWN DISPOSABLE) ×2
GOWN STRL REUS W/TWL XL LVL3 (GOWN DISPOSABLE) ×1
HANDLE YANKAUER SUCT BULB TIP (MISCELLANEOUS) IMPLANT
KIT TURNOVER KIT A (KITS) ×1 IMPLANT
LABEL OR SOLS (LABEL) ×1 IMPLANT
MANIFOLD NEPTUNE II (INSTRUMENTS) ×1 IMPLANT
NS IRRIG 500ML POUR BTL (IV SOLUTION) ×1 IMPLANT
PACK EXTREMITY ARMC (MISCELLANEOUS) ×1 IMPLANT
PAD PREP 24X41 OB/GYN DISP (PERSONAL CARE ITEMS) ×1 IMPLANT
SOL PREP PVP 2OZ (MISCELLANEOUS) ×2
SOLUTION PREP PVP 2OZ (MISCELLANEOUS) ×1 IMPLANT
SPONGE T-LAP 18X18 ~~LOC~~+RFID (SPONGE) IMPLANT
STOCKINETTE IMPERV 14X48 (MISCELLANEOUS) ×1 IMPLANT
TRAP FLUID SMOKE EVACUATOR (MISCELLANEOUS) ×1 IMPLANT
WATER STERILE IRR 500ML POUR (IV SOLUTION) ×1 IMPLANT

## 2022-11-17 NOTE — TOC Initial Note (Signed)
Transition of Care [facility]) - Initial/Assessment Note    Patient Details  Name: [name] MRN: [id number] Date of Birth: 1971-07-18  Transition of Care [employer]) CM/SW Contact:    Beverly Sessions, RN Phone Number: 11/17/2022, 2:46 PM  Clinical Narrative:                  Admitted for: Gangrene of lower extremity  Admitted from: home alone.  Girl friend lives local and assist with transport  PCP: [name]  Current home health/prior home health/DME: received bariatric RW previous admission  Open with [facility] home health  Notified today that patient would need 1 wound vac at discharge.  Referral was made to [facility] with 31m.  After that notified by PA that patient is now not ready for discharge and may be in hospital another week.  Olivia Mackie with 3 M notified          Patient Goals and CMS Choice            Expected Discharge Plan and Services                                              Prior Living Arrangements/Services                       Activities of Daily Living Home Assistive Devices/Equipment: Cane (specify quad or straight) ADL Screening (condition at time of admission) Patient's cognitive ability adequate to safely complete daily activities?: Yes Is the patient deaf or have difficulty hearing?: No Does the patient have difficulty seeing, even when wearing glasses/contacts?: No Does the patient have difficulty concentrating, remembering, or making decisions?: No Patient able to express need for assistance with ADLs?: Yes Does the patient have difficulty dressing or bathing?: No Independently performs ADLs?: Yes (appropriate for developmental age) Does the patient have difficulty walking or climbing stairs?: No Weakness of Legs: None Weakness of Arms/Hands: None  Permission Sought/Granted                  Emotional Assessment              Admission diagnosis:  Gangrene of lower extremity ([place]) [I96] Patient Active Problem  List   Diagnosis Date Noted   Gangrene of lower extremity ([place]) 11/10/2022   Reactive airway disease 11/10/2022   Diabetic ulcer of lower leg ([place]) 09/27/2022   COVID-19 virus infection 09/27/2022   Low testosterone 02/01/2021   Screening for malignant neoplasm of prostate 01/28/2021   Hyperprolactinemia ([place]) 07/03/2020   Gout 08/16/2019   End-stage renal disease ([place]) [id number]   Complication of vascular access for dialysis 06/07/2019   Diabetic retinopathy of both eyes ([place]) 04/12/2019   Obesity, Class III, BMI 40-49.9 (morbid obesity) ([place]) 04/12/2019   AVF (arteriovenous fistula) ([place]) 02/22/2019   Hyperkalemia 02/20/2019   End stage renal disease ([place]) 05/29/2018   Venous ulcer of ankle, left ([place]) 05/29/2018   Lymphedema 11/03/2017   Cellulitis of left lower extremity 10/01/2017   Chronic venous insufficiency 10/01/2017   Hypertensive disorder 10/01/2017   Hypertension 09/20/2017   Diabetes mellitus with hyperglycemia ([place]) 09/20/2017   Swelling of limb 09/20/2017   CKD (chronic kidney disease) stage 5, GFR less than 15 ml/min ([place]) 08/30/2017   PVD (peripheral vascular disease) ([place]) 06/18/2014   Long-term insulin use ([place]) 06/13/2014   Microalbuminuria 03/12/2014  Type 1 diabetes mellitus ([place]) 08/31/2003   PCP:  [name], MD Pharmacy:   [id number] - [place], [address] [place] [address] Phone: [phone] Fax: [phone]     Social Determinants of Health (SDOH) Social History: SDOH Screenings   Food Insecurity: No Food Insecurity (11/10/2022)  Housing: Low Risk  (11/10/2022)  Transportation Needs: No Transportation Needs (11/10/2022)  Utilities: Not At Risk (11/10/2022)  Tobacco Use: Medium Risk (11/17/2022)   SDOH Interventions:     Readmission Risk Interventions     No data to display

## 2022-11-17 NOTE — Progress Notes (Signed)
[facility]  ROUNDING NOTE   Subjective:   [name] is a 52 year old male with past medical conditions including anemia, hypertension, diabetes, PVD, sleep apnea, vitamin D deficiency, and end-stage renal disease on hemodialysis.  Patient presents to the hospital for scheduled debridement of left lower venous ulcers.  Patient currently admitted for Gangrene of lower extremity ([place]) [I96]  Patient seen sitting at side of bed Denies pain at this time Currently NPO for I&D.    Objective:  Vital signs in last 24 hours:  Temp:  [97.8 F (36.6 C)-98.6 F (37 C)] 97.8 F (36.6 C) (03/20 1157) Pulse Rate:  [69-76] 75 (03/20 1158) Resp:  [14-20] 18 (03/20 1158) BP: (132-161)/(71-95) 161/81 (03/20 1157) SpO2:  [95 %-100 %] 100 % (03/20 1158) Weight:  [176.1 kg] 176.1 kg (03/20 1002)  Weight change:  Filed Weights   11/16/22 0754 11/16/22 1208 11/17/22 1002  Weight: (!) 178.2 kg (!) 176.1 kg (!) 176.1 kg    Intake/Output: I/O last 3 completed shifts: In: -  Out: 2000 [Other:2000]   Intake/Output this shift:  No intake/output data recorded.  Physical Exam: General: NAD, laying in bed  Head: Normocephalic, atraumatic. Moist oral mucosal membranes  Eyes: Anicteric  Lungs:  Clear to auscultation, normal effort, room air  Heart: Regular rate and rhythm  Abdomen:  Soft, nontender, obese  Extremities: trace RLE peripheral edema  Neurologic: Nonfocal, moving all four extremities  Skin: No lesions, LLE wounds  Access: Lt AVG    Basic Metabolic Panel: Recent Labs  Lab 11/11/22 0811 11/12/22 0914 11/13/22 0953 11/16/22 0810 11/17/22 0603  NA 134* 137 135 138 136  K 5.3* 4.5 4.9 4.8 4.4  CL 101 98 98 99 101  CO2 25  --  27 26 28   GLUCOSE 124* 105* 141* 99 151*  BUN 52* 41* 61* 68* 50*  CREATININE 10.75* 10.50* 11.16* 13.10* 10.26*  CALCIUM 8.8*  --  8.7* 9.3 8.9  PHOS 6.3*  --  7.5* 7.2*  --      Liver Function Tests: Recent Labs  Lab  11/11/22 0811 11/13/22 0953 11/16/22 0810 11/17/22 0603  AST  --   --   --  17  ALT  --   --   --  <5  ALKPHOS  --   --   --  44  BILITOT  --   --   --  0.9  PROT  --   --   --  6.9  ALBUMIN 3.0* 3.0* 3.1* 3.1*    No results for input(s): "LIPASE", "AMYLASE" in the last 168 hours. No results for input(s): "AMMONIA" in the last 168 hours.  CBC: Recent Labs  Lab 11/11/22 0811 11/12/22 0914 11/13/22 0953 11/16/22 0810 11/17/22 0603  WBC 5.7  --  5.3 5.8 4.7  HGB 8.8* 9.5* 9.1* 9.5* 9.2*  HCT 29.0* 28.0* 29.9* 30.7* 29.4*  MCV 101.4*  --  102.0* 99.7 99.0  PLT 153  --  158 170 172     Cardiac Enzymes: No results for input(s): "CKTOTAL", "CKMB", "CKMBINDEX", "TROPONINI" in the last 168 hours.  BNP: Invalid input(s): "POCBNP"  CBG: Recent Labs  Lab 11/16/22 1617 11/16/22 2148 11/17/22 0821 11/17/22 0955 11/17/22 1158  GLUCAP 129* 127* 145* 113* 101*     Microbiology: Results for orders placed or performed during the hospital encounter of 11/10/22  Aerobic/Anaerobic Culture w Gram Stain (surgical/deep wound)     Status: None   Collection Time: 11/10/22 12:45 PM  Specimen: Leg, Left; Wound  Result Value Ref Range Status   Specimen Description   Final    LEG LEFT CALF WOUND Performed at [facility], [address] [place], [place] [address]    Special Requests   Final    NONE Performed at [facility], [address] [place], [place] [address]    Gram Stain NO WBC SEEN RARE GRAM NEGATIVE RODS   Final   Culture   Final    FEW ESCHERICHIA COLI RARE ENTEROCOCCUS FAECALIS NO ANAEROBES ISOLATED Performed at [facility], [address] [place], [place] [address]    Report Status 11/15/2022 FINAL  Final   Organism ID, Bacteria ESCHERICHIA COLI  Final   Organism ID, Bacteria ENTEROCOCCUS FAECALIS  Final      Susceptibility   Escherichia coli - MIC*    AMPICILLIN 4 SENSITIVE Sensitive     CEFEPIME <=0.12 SENSITIVE Sensitive      CEFTAZIDIME <=1 SENSITIVE Sensitive     CEFTRIAXONE <=0.25 SENSITIVE Sensitive     CIPROFLOXACIN <=0.25 SENSITIVE Sensitive     GENTAMICIN <=1 SENSITIVE Sensitive     IMIPENEM <=0.25 SENSITIVE Sensitive     TRIMETH/SULFA >=320 RESISTANT Resistant     AMPICILLIN/SULBACTAM <=2 SENSITIVE Sensitive     PIP/TAZO <=4 SENSITIVE Sensitive     * FEW ESCHERICHIA COLI   Enterococcus faecalis - MIC*    AMPICILLIN <=2 SENSITIVE Sensitive     VANCOMYCIN 1 SENSITIVE Sensitive     GENTAMICIN SYNERGY SENSITIVE Sensitive     LINEZOLID 2 SENSITIVE Sensitive     * RARE ENTEROCOCCUS FAECALIS  Aerobic/Anaerobic Culture w Gram Stain (surgical/deep wound)     Status: None   Collection Time: 11/10/22  1:00 PM   Specimen: Foot, Left; Wound  Result Value Ref Range Status   Specimen Description   Final    FOOT LEFT FOOT Performed at [facility], [address], [place], [place] [address]    Special Requests   Final    NONE Performed at [facility], [place], [place] [address]    Gram Stain NO WBC SEEN NO ORGANISMS SEEN   Final   Culture   Final    RARE ESCHERICHIA COLI RARE SERRATIA MARCESCENS RARE ENTEROCOCCUS FAECALIS NO ANAEROBES ISOLATED Performed at [facility], [address], [place],  [address]    Report Status 11/15/2022 FINAL  Final   Organism ID, Bacteria ESCHERICHIA COLI  Final   Organism ID, Bacteria SERRATIA MARCESCENS  Final   Organism ID, Bacteria ENTEROCOCCUS FAECALIS  Final      Susceptibility   Escherichia coli - MIC*    AMPICILLIN <=2 SENSITIVE Sensitive     CEFEPIME <=0.12 SENSITIVE Sensitive     CEFTAZIDIME <=1 SENSITIVE Sensitive     CEFTRIAXONE <=0.25 SENSITIVE Sensitive     CIPROFLOXACIN <=0.25 SENSITIVE Sensitive     GENTAMICIN <=1 SENSITIVE Sensitive     IMIPENEM <=0.25 SENSITIVE Sensitive     TRIMETH/SULFA <=20 SENSITIVE Sensitive     AMPICILLIN/SULBACTAM <=2 SENSITIVE Sensitive     PIP/TAZO <=4 SENSITIVE  Sensitive     * RARE ESCHERICHIA COLI   Enterococcus faecalis - MIC*    AMPICILLIN <=2 SENSITIVE Sensitive     VANCOMYCIN 1 SENSITIVE Sensitive     GENTAMICIN SYNERGY SENSITIVE Sensitive     LINEZOLID 2 SENSITIVE Sensitive     * RARE ENTEROCOCCUS FAECALIS   Serratia marcescens - MIC*    CEFEPIME <=  0.12 SENSITIVE Sensitive     CEFTAZIDIME <=1 SENSITIVE Sensitive     CEFTRIAXONE <=0.25 SENSITIVE Sensitive     CIPROFLOXACIN <=0.25 SENSITIVE Sensitive     GENTAMICIN <=1 SENSITIVE Sensitive     TRIMETH/SULFA <=20 SENSITIVE Sensitive     * RARE SERRATIA MARCESCENS    Coagulation Studies: No results for input(s): "LABPROT", "INR" in the last 72 hours.   Urinalysis: No results for input(s): "COLORURINE", "LABSPEC", "PHURINE", "GLUCOSEU", "HGBUR", "BILIRUBINUR", "KETONESUR", "PROTEINUR", "UROBILINOGEN", "NITRITE", "LEUKOCYTESUR" in the last 72 hours.  Invalid input(s): "APPERANCEUR"    Imaging: No results found.   Medications:    sodium chloride      ceFAZolin (ANCEF) IV     [MAR Hold] piperacillin-tazobactam (ZOSYN)  IV 2.25 g (11/17/22 0825)    [MAR Hold] allopurinol  100 mg Oral Daily   [MAR Hold] apixaban  5 mg Oral BID   [MAR Hold] vitamin C  500 mg Oral BID   [MAR Hold] atorvastatin  10 mg Oral Daily   [MAR Hold] calcium acetate  2,001 mg Oral TID WC   [MAR Hold] Chlorhexidine Gluconate Cloth  6 each Topical Q0600   [MAR Hold] cinacalcet  30 mg Oral Daily   [MAR Hold] clopidogrel  75 mg Oral Daily   [MAR Hold] docusate sodium  200 mg Oral BID   [MAR Hold] feeding supplement (NEPRO CARB STEADY)  237 mL Oral TID BM   [MAR Hold] guaiFENesin  600 mg Oral BID   [MAR Hold] insulin aspart  0-6 Units Subcutaneous TID WC   [MAR Hold] insulin glargine-yfgn  10 Units Subcutaneous QHS   [MAR Hold] mineral oil   Oral Once   [MAR Hold] multivitamin  1 tablet Oral QHS   [MAR Hold] polyethylene glycol  17 g Oral Daily   [MAR Hold] traZODone  50 mg Oral QHS   [MAR Hold] zinc  sulfate  220 mg Oral Daily   [MAR Hold] albuterol, [MAR Hold] amLODipine, [MAR Hold] atenolol, [MAR Hold] benzonatate, [MAR Hold] bisacodyl, [MAR Hold] calcium acetate, [MAR Hold] fentaNYL (SUBLIMAZE) injection, [MAR Hold]  HYDROmorphone (DILAUDID) injection, [MAR Hold] lidocaine-prilocaine, [MAR Hold] loratadine, [MAR Hold] melatonin, [MAR Hold] ondansetron (ZOFRAN) IV, [MAR Hold] mouth rinse, [MAR Hold] oxyCODONE-acetaminophen, [MAR Hold] polyethylene glycol  Assessment/ Plan:  [name] is a 52 y.o.  male with past medical conditions including anemia, hypertension, diabetes, PVD, sleep apnea, vitamin D deficiency, and end-stage renal disease on hemodialysis.  Patient presents to the hospital for scheduled debridement of left lower venous ulcers.  Patient currently admitted for Gangrene of lower extremity ([place]) [I96]    End stage renal disease on hemodialysis. Continue TTS schedule  Dialysis received yesterday, 2L achieved.  Next treatment scheduled for Thursday.   2. Anemia of chronic kidney disease.  Lab Results  Component Value Date   HGB 9.2 (L) 11/17/2022    Patient receives Mircera at outpatient clinic.   No need for ESA at this time  3. Secondary Hyperparathyroidism: with outpatient labs: PTH 270, phosphorus 5.0, calcium 9.2 on 11/09/22.   Lab Results  Component Value Date   CALCIUM 8.9 11/17/2022   CAION 1.14 (L) 11/12/2022   PHOS 7.2 (H) 11/16/2022  Phosphorus elevated Continue calcium acetate with meals  4.  Hypertension with chronic kidney disease. Blood pressure at goal. Continue home regimen of amlodipine and atenolol.    Blood pressure acceptable   LOS: 7 Shanique Aslinger 3/20/202412:01 PM

## 2022-11-17 NOTE — Op Note (Signed)
    OPERATIVE NOTE   PROCEDURE: Application of a wound VAC  PRE-OPERATIVE DIAGNOSIS: Necrotic venous ulcer of the left ankle and dorsum of the left foot  POST-OPERATIVE DIAGNOSIS: Same  SURGEON: [name]  ASSISTANT(S): None  ANESTHESIA: general  ESTIMATED BLOOD LOSS: Less than 5 cc  FINDING(S): Rich granulation the undermining is gone in most areas overall the wound appears quite healthy and to be healing quite nicely  SPECIMEN(S): None  INDICATIONS:   [name] is a 52 y.o. male who presents with ongoing treatment for his venous ulcers of the left leg.  He is coming to the operating room for possible further debridement with a VAC change under anesthesia..  DESCRIPTION: After full informed written consent was obtained from the patient, the patient was brought back to the operating room and placed supine upon the operating table.  Prior to induction, the patient received IV antibiotics.   After obtaining adequate anesthesia, the patient was then prepped and draped in the standard fashion for a  possible debridement with reapplication of the wound VAC.  The wound is richly granulated and although not much smaller in overall size clearly is building from the base upward and is more shallow.  The dorsal foot wound again is richly granulated and slightly smaller in circumference.  The wounds were then irrigated with saline and hemostasis was obtained with Bovie Bovie cautery.   The ankle wound is stellate in appearance and measures 25 cm in length by 12 cm in width, 300 cm.  The dorsal foot wound measures 6 cm x 6 cm, 36 cm   Cellerate was then placed in the bed of both wounds with coverage of approximately 2 mm. Two 5 g containers were utilized.  A black VAC sponge was then trimmed to the appropriate shape for both the ankle and then the dorsal foot wound.  The dressing was sealed with Ioban.  Prior to leaving the operating room the vacs were connected with a Y adapter  both wounds had good seals, no leaks.  This is a hospital VAC not a disposable VAC.   The patient tolerated this procedure well.      The patient tolerated this procedure well.   COMPLICATIONS: None  CONDITION: [name]  Vein [facility] V[facility]  Office: [phone]   11/17/2022, 11:51 AM

## 2022-11-17 NOTE — Anesthesia Procedure Notes (Signed)
Procedure Name: Intubation Date/Time: 11/17/2022 10:56 AM  Performed by: Hedda Slade, CRNAPre-anesthesia Checklist: Patient identified, Patient being monitored, Timeout performed, Emergency Drugs available and Suction available Patient Re-evaluated:Patient Re-evaluated prior to induction Oxygen Delivery Method: Circle system utilized Preoxygenation: Pre-oxygenation with 100% oxygen Induction Type: IV induction Ventilation: Oral airway inserted - appropriate to patient size and Two handed mask ventilation required Laryngoscope Size: 4 and McGraph Grade View: Grade I Tube type: Oral Tube size: 7.5 mm Number of attempts: 1 Airway Equipment and Method: Stylet Placement Confirmation: ETT inserted through vocal cords under direct vision, positive ETCO2 and breath sounds checked- equal and bilateral Secured at: 22 cm Tube secured with: Tape Dental Injury: Teeth and Oropharynx as per pre-operative assessment

## 2022-11-17 NOTE — Anesthesia Preprocedure Evaluation (Addendum)
Anesthesia Evaluation  Patient identified by MRN, date of birth, ID band Patient awake    Reviewed: Allergy & Precautions, H&P , NPO status , Patient's Chart, lab work & pertinent test results  History of Anesthesia Complications Negative for: history of anesthetic complications  Airway Mallampati: III  TM Distance: >3 FB Neck ROM: full   Comment: Large neck Dental  (+) Chipped, Poor Dentition, Missing   Pulmonary shortness of breath, sleep apnea , pneumonia, resolved, former smoker (quit 2019) Persistent cough since COVID 09/27/22   Pulmonary exam normal  + decreased breath sounds      Cardiovascular hypertension, (-) angina + Peripheral Vascular Disease (LE gangrene; on Plavix and warfarin) and + Orthopnea  (-) Past MI negative cardio ROS Normal cardiovascular exam+ dysrhythmias  Rhythm:Regular Rate:Normal  ECG 11/10/22: normal   Neuro/Psych negative neurological ROS  negative psych ROS   GI/Hepatic negative GI ROS, Neg liver ROS,neg GERD  ,,  Endo/Other  negative endocrine ROSdiabetes, Type 2, Insulin Dependent  Morbid obesityClass 3 obesity; pituitary microadenoma; hyperprolactinemia  Renal/GU ESRF and DialysisRenal diseaseLast dialyzed yesterday     Musculoskeletal   Abdominal  (+) + obese  Peds  Hematology negative hematology ROS (+)   Anesthesia Other Findings Past Medical History: No date: Anemia No date: Chronic kidney disease     Comment:  14% FUNCTION No date: Diabetes mellitus without complication (HCC) No date: Dyspnea     Comment:  DOE No date: Dysrhythmia No date: End stage renal disease (HCC) No date: Hypertension No date: Lymphedema of leg No date: Metabolic encephalopathy     Comment:  error No date: Microalbuminuria No date: Obesity No date: Sleep apnea     Comment:  no CPAP No date: Vitamin D deficiency  Past Surgical History: 10/24/2018: A/V FISTULAGRAM; Right     Comment:   Procedure: A/V FISTULAGRAM;  Surgeon: Katha Cabal, MD;  Location: [facility];  Service:               Cardiovascular;  Laterality: Right; 11/24/2018: A/V FISTULAGRAM; Right     Comment:  Procedure: A/V FISTULAGRAM;  Surgeon: Katha Cabal, MD;  Location: [place] [facility];  Service:               Cardiovascular;  Laterality: Right; 07/31/2019: A/V FISTULAGRAM; Left     Comment:  Procedure: A/V FISTULAGRAM;  Surgeon: Katha Cabal, MD;  Location: [facility];  Service:               Cardiovascular;  Laterality: Left; 12/11/2019: A/V FISTULAGRAM; Left     Comment:  Procedure: A/V FISTULAGRAM;  Surgeon: Katha Cabal, MD;  Location: [place] [facility];  Service:               Cardiovascular;  Laterality: Left; 02/21/2019: A/V SHUNT INTERVENTION; Right     Comment:  Procedure: A/V SHUNT INTERVENTION;  Surgeon: Katha Cabal, MD;  Location: [facility];  Service:              Cardiovascular;  Laterality:  Right; 0000000: APPLICATION OF WOUND VAC; Left     Comment:  Procedure: APPLICATION OF WOUND VAC;  Surgeon: Algernon Huxley, MD;  Location: [facility];  Service: General;                Laterality: Left; [id number]: APPLICATION OF WOUND VAC; Left     Comment:  Procedure: APPLICATION OF WOUND VAC;  Surgeon: Katha Cabal, MD;  Location: [facility];  Service: Vascular;                Laterality: Left; [id number]: APPLICATION OF WOUND VAC; Left     Comment:  Procedure: WOUND VAC CHANGE;  Surgeon: Katha Cabal, MD;  Location: [facility];  Service: Vascular;                Laterality: Left;  left lower leg [id number]: APPLICATION OF WOUND VAC; Left     Comment:  Procedure: WOUND VAC CHANGE;  Surgeon: Katha Cabal, MD;  Location: [facility];  Service: Vascular;                Laterality: Left; [id number]:  APPLICATION OF WOUND VAC; Left     Comment:  Procedure: APPLICATION OF WOUND VAC;  Surgeon: Katha Cabal, MD;  Location: [facility];  Service: Vascular;                Laterality: Left; 07/19/2018: AV FISTULA INSERTION W/ RF MAGNETIC GUIDANCE; Right     Comment:  Procedure: AV FISTULA INSERTION W/RF MAGNETIC GUIDANCE;               Surgeon: Katha Cabal, MD;  Location: [place]              [facility];  Service: Cardiovascular;  Laterality: Right; 05/11/2019: AV FISTULA PLACEMENT; Left     Comment:  Procedure: ARTERIOVENOUS (AV) FISTULA CREATION               (RADIOCEPHALIC );  Surgeon: Katha Cabal, MD;                Location: [facility];  Service: Vascular;  Laterality:               Left; 11/01/2018: CATARACT EXTRACTION W/PHACO; Left     Comment:  Procedure: CATARACT EXTRACTION PHACO AND INTRAOCULAR               LENS PLACEMENT (IOC)-LEFT, DIABETIC-INSULIN DEPENDENT;                Surgeon: Eulogio Bear, MD;  Location: [facility];                Service: Ophthalmology;  Laterality: Left;  [place]               00:41.8 CDE 4.61 Fluid Pack Lot # [id number] 04/27/2019: CATARACT EXTRACTION W/PHACO; Right     Comment:  Procedure: CATARACT EXTRACTION PHACO AND INTRAOCULAR               LENS PLACEMENT (IOC);  Surgeon: Eulogio Bear, MD;  Location: [facility];  Service: Ophthalmology;  Laterality:              Right;  [place] 00:34 CDE 1.97 FLUID PACK LOT # [id number]  02/23/2019: DIALYSIS/PERMA CATHETER INSERTION; N/A     Comment:  Procedure: DIALYSIS/PERMA CATHETER INSERTION;  Surgeon:               Algernon Huxley, MD;  Location: [place] [facility];                Service: Cardiovascular;  Laterality: N/A; 10/11/2017: I & D EXTREMITY; Left     Comment:  Procedure: IRRIGATION AND DEBRIDEMENT EXTREMITY;                Surgeon: Katha Cabal, MD;  Location: [facility];                Service: Vascular;  Laterality: Left; 10/07/2017: INCISION AND DRAINAGE  ABSCESS; Right     Comment:  Procedure: INCISION AND DRAINAGE ABSCESS;  Surgeon:               Katha Cabal, MD;  Location: [facility];  Service:               Vascular;  Laterality: Right; 10/03/2017: IRRIGATION AND DEBRIDEMENT ABSCESS; Left     Comment:  Procedure: IRRIGATION AND [place] with               debridement of skin, soft tissue, muscle 50sq cm;                Surgeon: Algernon Huxley, MD;  Location: [facility];  Service:              General;  Laterality: Left; 02/20/2019: TEMPORARY DIALYSIS CATHETER     Comment:  Procedure: TEMPORARY DIALYSIS CATHETER;  Surgeon:               Katha Cabal, MD;  Location: [place] [facility];               Service: Cardiovascular;; 09/18/2019: UPPER EXTREMITY ANGIOGRAPHY; Left     Comment:  Procedure: UPPER EXTREMITY ANGIOGRAPHY;  Surgeon:               Katha Cabal, MD;  Location: [place] [facility];               Service: Cardiovascular;  Laterality: Left;  BMI    Body Mass Index: 47.47 kg/m      Reproductive/Obstetrics negative OB ROS                             Anesthesia Physical Anesthesia Plan  ASA: 3  Anesthesia Plan: General ETT   Post-op Pain Management:    Induction: Intravenous  PONV Risk Score and Plan: 2 and Ondansetron, Dexamethasone, Treatment may vary due to age or medical condition and Midazolam  Airway Management Planned: Oral ETT  Additional Equipment: None  Intra-op Plan:   Post-operative Plan: Extubation in OR  Informed Consent: I have reviewed the patients History and Physical, chart, labs and discussed the procedure including the risks, benefits and alternatives for the proposed anesthesia with the patient or authorized representative who has indicated his/her understanding and acceptance.     Dental Advisory Given  Plan Discussed with: Anesthesiologist, CRNA and Surgeon  Anesthesia Plan Comments: (Patient consented for risks of anesthesia  including but not limited to:  - adverse  reactions to medications - damage to eyes, teeth, lips or other oral mucosa - nerve damage due to positioning  - sore throat or hoarseness - Damage to heart, brain, nerves, lungs, other parts of body or loss of life  Patient voiced understanding.)        Anesthesia Quick Evaluation

## 2022-11-17 NOTE — Transfer of Care (Signed)
Immediate Anesthesia Transfer of Care Note  Patient: [name]  Procedure(s) Performed: DEBRIDEMENT WOUND (Left: Leg Lower) Wound vac exchange (Left: Leg Lower)  Patient Location: PACU  Anesthesia Type:General  Level of Consciousness: awake, alert , and oriented  Airway & Oxygen Therapy: Patient Spontanous Breathing  Post-op Assessment: Report given to RN and Post -op Vital signs reviewed and stable  Post vital signs: Reviewed and stable  Last Vitals:  Vitals Value Taken Time  BP 161/81 11/17/22 1157  Temp 36.6 C 11/17/22 1157  Pulse 74 11/17/22 1200  Resp 15 11/17/22 1200  SpO2 100 % 11/17/22 1200  Vitals shown include unvalidated device data.  Last Pain:  Vitals:   11/17/22 1157  TempSrc:   PainSc: 0-No pain      Patients Stated Pain Goal: 0 ([id number])  Complications: No notable events documented.

## 2022-11-17 NOTE — Anesthesia Postprocedure Evaluation (Signed)
Anesthesia Post Note  Patient: [name]  Procedure(s) Performed: DEBRIDEMENT WOUND (Left: Leg Lower) Wound vac exchange (Left: Leg Lower)  Patient location during evaluation: PACU Anesthesia Type: General Level of consciousness: awake and alert Pain management: pain level controlled Vital Signs Assessment: post-procedure vital signs reviewed and stable Respiratory status: spontaneous breathing, nonlabored ventilation, respiratory function stable and patient connected to nasal cannula oxygen Cardiovascular status: blood pressure returned to baseline and stable Postop Assessment: no apparent nausea or vomiting Anesthetic complications: no  No notable events documented.   Last Vitals:  Vitals:   11/17/22 1210 11/17/22 1215  BP:  125/76  Pulse: 72 73  Resp: 17 15  Temp:    SpO2: 96% 95%    Last Pain:  Vitals:   11/17/22 1215  TempSrc:   PainSc: [place]

## 2022-11-17 NOTE — Interval H&P Note (Signed)
History and Physical Interval Note:  11/17/2022 10:17 AM  [name]  has presented today for surgery, with the diagnosis of Non-pressure chronic ulcer of unspecified part of unspecified lower leg with necrosis of muscle, Cellulitis of left lower limb.  The various methods of treatment have been discussed with the patient and family. After consideration of risks, benefits and other options for treatment, the patient has consented to  Procedure(s): DEBRIDEMENT WOUND (Left) Wound vac exchange (Left) as a surgical intervention.  The patient's history has been reviewed, patient examined, no change in status, stable for surgery.  I have reviewed the patient's chart and labs.  Questions were answered to the patient's satisfaction.     [name]

## 2022-11-17 NOTE — Progress Notes (Signed)
[facility] at [place] NAME: [name]    MR#:  [id number]  DATE OF BIRTH:  21-Jul-1971  SUBJECTIVE:  Wife at bedside S/p application of wound vac in OR today No issues per pt   VITALS:  Blood pressure (!) 149/75, pulse 77, temperature 97.6 F (36.4 C), temperature source Oral, resp. rate 18, height 6\' 4"  (1.93 m), weight (!) 176.1 kg, SpO2 96 %.  PHYSICAL EXAMINATION:   GENERAL:  52 y.o.-year-old patient with no acute distress. obese LUNGS: Normal breath sounds bilaterally, no wheezing CARDIOVASCULAR: S1, S2 normal. No murmur   ABDOMEN: Soft, nontender, nondistended. Bowel sounds present.  EXTREMITIES:  From 11/17/22 NEUROLOGIC: nonfocal  patient is alert and awake SKIN: as above  LABORATORY PANEL:  CBC Recent Labs  Lab 11/17/22 0603  WBC 4.7  HGB 9.2*  HCT 29.4*  PLT 172    Chemistries  Recent Labs  Lab 11/17/22 0603  NA 136  K 4.4  CL 101  CO2 28  GLUCOSE 151*  BUN 50*  CREATININE 10.26*  CALCIUM 8.9  AST 17  ALT <5  ALKPHOS 44  BILITOT 0.9    Assessment and Plan   Gangrene of lower extremity ([place]) --Secondary to PAD and uncontrolled type 2 diabetes. --Status post excisional debridement of 2 left lower extremity venous ulcers with placement of wound VAC dressing to both venous ulcers 03/13 --Patient returned to the OR for further debridement of necrotic tissue 03/15 --Wound cultures-yielded E. coli, Enterococcus faecalis and Serratia Marcescens --Continue IV Zosyn -Optimize glycemic control --Further management per vascular surgery--need to stay till next wed for OR wound vac change per Dr Delana Meyer   Hypertension --pt now on amlodipine and atenolol   Diabetes mellitus with hyperglycemia ([place]) --A1c last checked in January 2024, elevated at 8.6 %.   --Patient has recently established with endocrinology.  -- He is currently on Lantus 10 units at bedtime.   --Blood sugars are stable   End stage renal  disease [facility]) - Nephrology consulted --Renal replacement therapy T/TH/S   Reactive airway disease --Patient states that since having COVID approximately 6 weeks ago, he has been experiencing a persistent nonproductive cough and wheezing.    --Continue albuterol as needed --Tessalon Perles and guaifenesin for cough   Hyperprolactinemia ([place]) - Per patient he was told by his endocrinologist to discontinue bromocriptine since his prolactin levels have normalized. -Will hold bromocriptine for now -Patient to follow-up with endocrinology as an out patient   Morbid Obesity (BMI 46.99) Lifestyle modification and exercise has been discussed with patient in detail Complicates overall prognosis and care.       Procedures: wound vac placement Family communication :wife at bedside CODE STATUS: full DVT Prophylaxis :eliquis Level of care: Med-Surg    TOTAL TIME TAKING CARE OF THIS PATIENT: 30 minutes.  >50% time spent on counselling and coordination of care  Note: This dictation was prepared with Dragon dictation along with smaller phrase technology. Any transcriptional errors that result from this process are unintentional.  Fritzi Mandes M.D    [facility]   CC: Primary care physician; [name], MD

## 2022-11-18 ENCOUNTER — Encounter: Payer: Self-pay | Admitting: Vascular Surgery

## 2022-11-18 DIAGNOSIS — Z992 Dependence on renal dialysis: Secondary | ICD-10-CM

## 2022-11-18 DIAGNOSIS — I96 Gangrene, not elsewhere classified: Secondary | ICD-10-CM | POA: Diagnosis not present

## 2022-11-18 DIAGNOSIS — L97909 Non-pressure chronic ulcer of unspecified part of unspecified lower leg with unspecified severity: Secondary | ICD-10-CM | POA: Diagnosis not present

## 2022-11-18 DIAGNOSIS — L97329 Non-pressure chronic ulcer of left ankle with unspecified severity: Secondary | ICD-10-CM | POA: Diagnosis not present

## 2022-11-18 DIAGNOSIS — L97529 Non-pressure chronic ulcer of other part of left foot with unspecified severity: Secondary | ICD-10-CM

## 2022-11-18 DIAGNOSIS — E11622 Type 2 diabetes mellitus with other skin ulcer: Secondary | ICD-10-CM | POA: Diagnosis not present

## 2022-11-18 DIAGNOSIS — Z9889 Other specified postprocedural states: Secondary | ICD-10-CM | POA: Diagnosis not present

## 2022-11-18 DIAGNOSIS — I87332 Chronic venous hypertension (idiopathic) with ulcer and inflammation of left lower extremity: Secondary | ICD-10-CM | POA: Diagnosis not present

## 2022-11-18 DIAGNOSIS — I10 Essential (primary) hypertension: Secondary | ICD-10-CM | POA: Diagnosis not present

## 2022-11-18 LAB — RENAL FUNCTION PANEL
Albumin: 3.1 g/dL — ABNORMAL LOW (ref 3.5–5.0)
Anion gap: 13 (ref 5–15)
BUN: 63 mg/dL — ABNORMAL HIGH (ref 6–20)
CO2: 25 mmol/L (ref 22–32)
Calcium: 9.2 mg/dL (ref 8.9–10.3)
Chloride: 99 mmol/L (ref 98–111)
Creatinine, Ser: 12.75 mg/dL — ABNORMAL HIGH (ref 0.61–1.24)
GFR, Estimated: 4 mL/min — ABNORMAL LOW (ref >60)
Glucose, Bld: 132 mg/dL — ABNORMAL HIGH (ref 70–99)
Phosphorus: 6.9 mg/dL — ABNORMAL HIGH (ref 2.5–4.6)
Potassium: 4.4 mmol/L (ref 3.5–5.1)
Sodium: 137 mmol/L (ref 135–145)

## 2022-11-18 LAB — GLUCOSE, CAPILLARY
Glucose-Capillary: 123 mg/dL — ABNORMAL HIGH (ref 70–99)
Glucose-Capillary: 143 mg/dL — ABNORMAL HIGH (ref 70–99)
Glucose-Capillary: 116 mg/dL — ABNORMAL HIGH (ref 70–99)
Glucose-Capillary: 136 mg/dL — ABNORMAL HIGH (ref 70–99)
Glucose-Capillary: 164 mg/dL — ABNORMAL HIGH (ref 70–99)

## 2022-11-18 LAB — CBC
HCT: 30.8 % — ABNORMAL LOW (ref 39.0–52.0)
Hemoglobin: 9.5 g/dL — ABNORMAL LOW (ref 13.0–17.0)
MCH: 31.5 pg (ref 26.0–34.0)
MCHC: 30.8 g/dL (ref 30.0–36.0)
MCV: 102 fL — ABNORMAL HIGH (ref 80.0–100.0)
Platelets: 176 10*3/uL (ref 150–400)
RBC: 3.02 MIL/uL — ABNORMAL LOW (ref 4.22–5.81)
RDW: 15.1 % (ref 11.5–15.5)
WBC: 5.1 10*3/uL (ref 4.0–10.5)
nRBC: 0 % (ref 0.0–0.2)

## 2022-11-18 MED ORDER — ATENOLOL 50 MG PO TABS
50.0000 mg | ORAL_TABLET | ORAL | Status: DC
  Administered 2022-11-19 – 2022-11-24 (×3): 50 mg via ORAL
  Filled 2022-11-18 (×4): qty 1

## 2022-11-18 MED ORDER — AMLODIPINE BESYLATE 10 MG PO TABS
10.0000 mg | ORAL_TABLET | ORAL | Status: DC
  Administered 2022-11-19 – 2022-11-24 (×3): 10 mg via ORAL
  Filled 2022-11-18 (×7): qty 1

## 2022-11-18 NOTE — Progress Notes (Signed)
  Progress Note    11/18/2022 3:26 PM 1 Day Post-Op  Subjective: [name] is a 52 year old male who is now postop day 1 from his second wound washout with VAC placement.  Wound VAC is in place and working well.  Patient's wound VAC is connected to a Y which connects to the top of his foot and to the lateral side of his left lower extremity.  Patient has no complaints.  Patient's vitals all remained stable.  Patient continues on hemodialysis Tuesday, Thursday, Saturday.    Vitals:   11/18/22 1241 11/18/22 1339  BP: 138/79 (!) 144/70  Pulse: 73 78  Resp: 13 18  Temp: 98.3 F (36.8 C) 97.7 F (36.5 C)  SpO2: 98% 99%   Physical Exam: Cardiac:  RRR S1 and S2 Normal, No murmurs noted. Lungs: Clear on auscultation bilaterally, normal breathing effort, no wheezing rhonchi or rales. Incisions: Left lower extremity with wound VAC placed to wounds, top of left foot, medial lateral left calf. Extremities: Bilateral lower extremities with +4 edema.  Left lower extremity with wound VAC x 2 areas.  First top of the foot, second medial lateral calf.  Unable to palpate pulses to bilateral lower extremities due to +4 edema. Abdomen: Positive bowel sounds throughout, soft, nontender, nondistended. Neurologic: Alert and oriented x 4, answers all questions appropriately, follows all commands appropriately.  CBC    Component Value Date/Time   WBC 5.1 11/18/2022 0935   RBC 3.02 (L) 11/18/2022 0935   HGB 9.5 (L) 11/18/2022 0935   HCT 30.8 (L) 11/18/2022 0935   PLT 176 11/18/2022 0935   MCV 102.0 (H) 11/18/2022 0935   MCH 31.5 11/18/2022 0935   MCHC 30.8 11/18/2022 0935   RDW 15.1 11/18/2022 0935   LYMPHSABS 1.3 11/10/2022 1122   MONOABS 0.6 11/10/2022 1122   EOSABS 0.2 11/10/2022 1122   BASOSABS 0.1 11/10/2022 1122    BMET    Component Value Date/Time   NA 137 11/18/2022 0935   K 4.4 11/18/2022 0935   CL 99 11/18/2022 0935   CO2 25 11/18/2022 0935   GLUCOSE 132 (H) 11/18/2022 0935    BUN 63 (H) 11/18/2022 0935   CREATININE 12.75 (H) 11/18/2022 0935   CALCIUM 9.2 11/18/2022 0935   GFRNONAA 4 (L) 11/18/2022 0935   GFRAA 9 (L) 02/19/2020 1316    INR    Component Value Date/Time   INR 1.1 11/12/2022 2040     Intake/Output Summary (Last 24 hours) at 11/18/2022 1526 Last data filed at 11/18/2022 1241 Gross per 24 hour  Intake 420.18 ml  Output 2000 ml  Net -1579.82 ml     Assessment/Plan:  52 y.o. male is s/p left lower extremity wound washout with wound VAC placement x 2.  1 Day Post-Op   Plan: Patient will be taken back to the operating room for a third wound washout with VAC change next Wednesday 11/24/2022.  Wound cultures returned positive for E. coli, Enterococcus facialis and Serratia.  Antibiotics were changed to Unasyn 3 g every 12 hours and Cipro 400 mg every 24 hours for complete coverage related to sensitivity testing. Continue patient on Eliquis and Plavix as prior. Patient to be reevaluated for discharge to home with wound VAC changes twice a week after wound VAC change in the OR on 11/24/2022.  DVT prophylaxis: Eliquis 5 mg twice daily and Plavix 75 mg once a day   Drema Pry [facility] 11/18/2022 3:26 PM

## 2022-11-18 NOTE — TOC Progression Note (Signed)
Transition of Care [facility]) - Progression Note    Patient Details  Name: [name] MRN: [id number] Date of Birth: Dec 02, 1970  [facility] CM/SW Contact  Beverly Sessions, RN Phone Number: 11/18/2022, 2:21 PM  Clinical Narrative:     Per vascular patient will have vac change in OR next week        Expected Discharge Plan and Services                                               Social Determinants of Health (SDOH) Interventions SDOH Screenings   Food Insecurity: No Food Insecurity (11/10/2022)  Housing: Low Risk  (11/10/2022)  Transportation Needs: No Transportation Needs (11/10/2022)  Utilities: Not At Risk (11/10/2022)  Tobacco Use: Medium Risk (11/18/2022)    Readmission Risk Interventions     No data to display

## 2022-11-18 NOTE — Progress Notes (Signed)
Nutrition Follow Up Note   DOCUMENTATION CODES:   Morbid obesity  INTERVENTION:   Nepro Shake po TID, each supplement provides 425 kcal and 19 grams protein  Double protein on meal trays  Rena-vit po daily   Ocuvite daily for wound healing (provides zinc, vitamin A, vitamin C, Vitamin E, copper, and selenium)  Vitamin C 500mg  po BID  Zinc 220mg  po daily x 30 days  Daily weights   NUTRITION DIAGNOSIS:   Increased nutrient needs related to chronic illness, wound healing as evidenced by estimated needs.  GOAL:   Patient will meet greater than or equal to 90% of their needs -met   MONITOR:   PO intake, Supplement acceptance, Labs, Weight trends, I & O's, Skin  ASSESSMENT:   52 y/o male with h/o HTN, IDDM, ESRD on HD, gout, OSA, PVD, recent COVID 19, chronic VSUs, hyperprolactinemia r/t pituitary microadenoma and hyperlipidemia who presents with gangrene of lower extremity s/p I & D of two left lower extremity venous ulcers with placement of wound VAC 3/13.  -Pt s/p excisional debridement of 2 left lower extremity venous ulcers and VAC placement [id number].  -Pt s/p application of a wound VAC 3/20  Unable to speak with pt as pt in dialysis at time of RD visit. Pt with good oral intake since admission. Per RN report, pt is drinking the Nepro supplements. Recommend continue supplements and vitamins until wound healing is complete. Per chart, pt appears to be at his UBW.   Medications reviewed and include: allopurinol, vitamin C, phoslo, cinacalcet, colace, rena-vit, miralax, insulin, zinc   Labs reviewed: K 4.4 wnl, BUN 63(H), creat 12.75(H), P 6.9(H) Hgb 9.5(L), Hct 30.8(L) Cbgs- 143, 121 x 48 hrs   Diet Order:   Diet Order             Diet renal/carb modified with fluid restriction Diet-HS Snack? Nothing; Fluid restriction: 1200 mL Fluid; Room service appropriate? Yes; Fluid consistency: Thin  Diet effective now                  EDUCATION NEEDS:   Education needs  have been addressed  Skin:  Skin Assessment: Reviewed RN Assessment (incision L leg), VAC  Last BM:  3/18- type 4  Height:   Ht Readings from Last 1 Encounters:  11/17/22 6\' 4"  (1.93 m)    Weight:   Wt Readings from Last 1 Encounters:  11/18/22 (!) 175.9 kg    Ideal Body Weight:  91.8 kg  BMI:  Body mass index is 47.2 kg/m.  Estimated Nutritional Needs:   Kcal:  3000-3300kcal/day  Protein:  >150g/day  Fluid:  UOP +1L  Koleen Distance MS, RD, LDN Please refer to [facility] for RD and/or RD on-call/weekend/after hours pager

## 2022-11-18 NOTE — Progress Notes (Signed)
  Received patient in bed to unit.   Informed consent signed and in chart.    TX duration:3.5hrs     Transported back to floor  Hand-off given to patient's nurse.    Access used: LAVG Access issues: none   Total UF removed: 2.0kg Medication(s) given: none Post HD VS: stable Post HD weight: 175.9kg     Darrol Jump  [facility]

## 2022-11-18 NOTE — Progress Notes (Signed)
[name] at [place] NAME: [name]    MR#:  [id number]  DATE OF BIRTH:  [birth year]  SUBJECTIVE:  Wife at bedside S/p application of wound vac in OR 11/17/22 Seen at HD today Discussed about BP staying high and need to consider bp meds atleast on NON HD days--agreeable   VITALS:  Blood pressure (!) 144/70, pulse 78, temperature 97.7 F (36.5 C), resp. rate 18, height 6\' 4"  (1.93 m), weight (!) 175.9 kg, SpO2 99 %.  PHYSICAL EXAMINATION:   GENERAL:  52 y.o.-year-old patient with no acute distress. obese LUNGS: Normal breath sounds bilaterally, no wheezing CARDIOVASCULAR: S1, S2 normal. No murmur   ABDOMEN: Soft, nontender, nondistended. Bowel sounds present.  EXTREMITIES:  From 11/17/22 NEUROLOGIC: nonfocal  patient is alert and awake SKIN: as above  LABORATORY PANEL:  CBC Recent Labs  Lab 11/18/22 0935  WBC 5.1  HGB 9.5*  HCT 30.8*  PLT 176     Chemistries  Recent Labs  Lab 11/17/22 0603 11/18/22 0935  NA 136 137  K 4.4 4.4  CL 101 99  CO2 28 25  GLUCOSE 151* 132*  BUN 50* 63*  CREATININE 10.26* 12.75*  CALCIUM 8.9 9.2  AST 17  --   ALT <5  --   ALKPHOS 44  --   BILITOT 0.9  --      Assessment and Plan   Gangrene of lower extremity ([facility]) --Secondary to PAD and uncontrolled type 2 diabetes. --Status post excisional debridement of 2 left lower extremity venous ulcers with placement of wound VAC dressing to both venous ulcers 03/13 --Patient returned to the OR for further debridement of necrotic tissue 03/15 --Wound cultures-yielded E. coli, Enterococcus faecalis and Serratia Marcescens --Continue IV Zosyn -Optimize glycemic control --Further management per vascular surgery--need to stay till next wed for OR wound vac change per Dr Delana Meyer   Hypertension --pt now on amlodipine and atenolol on NON dialysis days (after d/w pt--dr Kolluru aware)   Diabetes mellitus with hyperglycemia ([name]) --A1c last  checked in January 2024, elevated at 8.6 %.   --Patient has recently established with endocrinology.  -- He is currently on Lantus 10 units at bedtime.   --Blood sugars are stable   End stage renal disease [name]) - Nephrology consulted --Renal replacement therapy T/TH/S   Reactive airway disease --Patient states that since having COVID approximately 6 weeks ago, he has been experiencing a persistent nonproductive cough and wheezing.    --Continue albuterol as needed --Tessalon Perles and guaifenesin for cough   Hyperprolactinemia ([name]) - Per patient he was told by his endocrinologist to discontinue bromocriptine since his prolactin levels have normalized. -Will hold bromocriptine for now -Patient to follow-up with endocrinology as an out patient   Morbid Obesity (BMI 46.99) Lifestyle modification and exercise has been discussed with patient in detail Complicates overall prognosis and care.       Procedures: wound vac placement Family communication :wife at bedside CODE STATUS: full DVT Prophylaxis :eliquis Level of care: Med-Surg    TOTAL TIME TAKING CARE OF THIS PATIENT: 30 minutes.  >50% time spent on counselling and coordination of care  Note: This dictation was prepared with Dragon dictation along with smaller phrase technology. Any transcriptional errors that result from this process are unintentional.  Fritzi Mandes M.D    [facility]   CC: Primary care physician; [name], MD

## 2022-11-18 NOTE — Progress Notes (Signed)
[facility]  ROUNDING NOTE   Subjective:   [name] is a 52 year old male with past medical conditions including anemia, hypertension, diabetes, PVD, sleep apnea, vitamin D deficiency, and end-stage renal disease on hemodialysis.  Patient presents to the hospital for scheduled debridement of left lower venous ulcers.  Patient currently admitted for Gangrene of lower extremity ([place]) [I96]  Patient seen and evaluated during dialysis   HEMODIALYSIS FLOWSHEET:  Blood Flow Rate (mL/min): 400 mL/min Arterial Pressure (mmHg): -250 mmHg Venous Pressure (mmHg): 190 mmHg TMP (mmHg): 13 mmHg Ultrafiltration Rate (mL/min): 829 mL/min Dialysate Flow Rate (mL/min): 300 ml/min  Tolerating treatment well Pain well-managed   Objective:  Vital signs in last 24 hours:  Temp:  [97.5 F (36.4 C)-98.3 F (36.8 C)] 98.3 F (36.8 C) (03/21 0834) Pulse Rate:  [70-80] 80 (03/21 0930) Resp:  [11-18] 16 (03/21 0930) BP: (125-176)/(57-91) 151/78 (03/21 0930) SpO2:  [92 %-100 %] 97 % (03/21 0930) Weight:  [176.1 kg-181 kg] 177.8 kg (03/21 0834)  Weight change: -2.1 kg Filed Weights   11/17/22 1002 11/18/22 0403 11/18/22 0834  Weight: (!) 176.1 kg (!) 181 kg (!) 177.8 kg    Intake/Output: I/O last 3 completed shifts: In: 620.2 [I.V.:200; IV Piggyback:420.2] Out: -    Intake/Output this shift:  No intake/output data recorded.  Physical Exam: General: NAD, laying in bed  Head: Normocephalic, atraumatic. Moist oral mucosal membranes  Eyes: Anicteric  Lungs:  Clear to auscultation, normal effort, room air  Heart: Regular rate and rhythm  Abdomen:  Soft, nontender, obese  Extremities: trace RLE peripheral edema  Neurologic: Nonfocal, moving all four extremities  Skin: No lesions, LLE wounds with wound VAC  Access: Lt AVG    Basic Metabolic Panel: Recent Labs  Lab 11/12/22 0914 11/13/22 0953 11/16/22 0810 11/17/22 0603  NA 137 135 138 136  K 4.5 4.9 4.8 4.4  CL 98  98 99 101  CO2  --  27 26 28   GLUCOSE 105* 141* 99 151*  BUN 41* 61* 68* 50*  CREATININE 10.50* 11.16* 13.10* 10.26*  CALCIUM  --  8.7* 9.3 8.9  PHOS  --  7.5* 7.2*  --      Liver Function Tests: Recent Labs  Lab 11/13/22 0953 11/16/22 0810 11/17/22 0603  AST  --   --  17  ALT  --   --  <5  ALKPHOS  --   --  44  BILITOT  --   --  0.9  PROT  --   --  6.9  ALBUMIN 3.0* 3.1* 3.1*    No results for input(s): "LIPASE", "AMYLASE" in the last 168 hours. No results for input(s): "AMMONIA" in the last 168 hours.  CBC: Recent Labs  Lab 11/12/22 0914 11/13/22 0953 11/16/22 0810 11/17/22 0603  WBC  --  5.3 5.8 4.7  HGB 9.5* 9.1* 9.5* 9.2*  HCT 28.0* 29.9* 30.7* 29.4*  MCV  --  102.0* 99.7 99.0  PLT  --  158 170 172     Cardiac Enzymes: No results for input(s): "CKTOTAL", "CKMB", "CKMBINDEX", "TROPONINI" in the last 168 hours.  BNP: Invalid input(s): "POCBNP"  CBG: Recent Labs  Lab 11/17/22 1158 11/17/22 1249 11/17/22 1727 11/17/22 2127 11/18/22 0746  GLUCAP 101* 97 123* 121* 143*     Microbiology: Results for orders placed or performed during the hospital encounter of 11/10/22  Aerobic/Anaerobic Culture w Gram Stain (surgical/deep wound)     Status: None   Collection Time: 11/10/22 12:45  PM   Specimen: Leg, Left; Wound  Result Value Ref Range Status   Specimen Description   Final    LEG LEFT CALF WOUND Performed at [facility], [address] [place], [place] [address]    Special Requests   Final    NONE Performed at [facility], [address] [place], [place] [address]    Gram Stain NO WBC SEEN RARE GRAM NEGATIVE RODS   Final   Culture   Final    FEW ESCHERICHIA COLI RARE ENTEROCOCCUS FAECALIS NO ANAEROBES ISOLATED Performed at [facility], [address] [place], [place] [address]    Report Status 11/15/2022 FINAL  Final   Organism ID, Bacteria ESCHERICHIA COLI  Final   Organism ID, Bacteria ENTEROCOCCUS  FAECALIS  Final      Susceptibility   Escherichia coli - MIC*    AMPICILLIN 4 SENSITIVE Sensitive     CEFEPIME <=0.12 SENSITIVE Sensitive     CEFTAZIDIME <=1 SENSITIVE Sensitive     CEFTRIAXONE <=0.25 SENSITIVE Sensitive     CIPROFLOXACIN <=0.25 SENSITIVE Sensitive     GENTAMICIN <=1 SENSITIVE Sensitive     IMIPENEM <=0.25 SENSITIVE Sensitive     TRIMETH/SULFA >=320 RESISTANT Resistant     AMPICILLIN/SULBACTAM <=2 SENSITIVE Sensitive     PIP/TAZO <=4 SENSITIVE Sensitive     * FEW ESCHERICHIA COLI   Enterococcus faecalis - MIC*    AMPICILLIN <=2 SENSITIVE Sensitive     VANCOMYCIN 1 SENSITIVE Sensitive     GENTAMICIN SYNERGY SENSITIVE Sensitive     LINEZOLID 2 SENSITIVE Sensitive     * RARE ENTEROCOCCUS FAECALIS  Aerobic/Anaerobic Culture w Gram Stain (surgical/deep wound)     Status: None   Collection Time: 11/10/22  1:00 PM   Specimen: Foot, Left; Wound  Result Value Ref Range Status   Specimen Description   Final    FOOT LEFT FOOT Performed at [facility], [address] [place], [place] [address]    Special Requests   Final    NONE Performed at [facility], [place], [place] [address]    Gram Stain NO WBC SEEN NO ORGANISMS SEEN   Final   Culture   Final    RARE ESCHERICHIA COLI RARE SERRATIA MARCESCENS RARE ENTEROCOCCUS FAECALIS NO ANAEROBES ISOLATED Performed at [facility], [address] [place], [place] [address]    Report Status 11/15/2022 FINAL  Final   Organism ID, Bacteria ESCHERICHIA COLI  Final   Organism ID, Bacteria SERRATIA MARCESCENS  Final   Organism ID, Bacteria ENTEROCOCCUS FAECALIS  Final      Susceptibility   Escherichia coli - MIC*    AMPICILLIN <=2 SENSITIVE Sensitive     CEFEPIME <=0.12 SENSITIVE Sensitive     CEFTAZIDIME <=1 SENSITIVE Sensitive     CEFTRIAXONE <=0.25 SENSITIVE Sensitive     CIPROFLOXACIN <=0.25 SENSITIVE Sensitive     GENTAMICIN <=1 SENSITIVE Sensitive     IMIPENEM <=0.25  SENSITIVE Sensitive     TRIMETH/SULFA <=20 SENSITIVE Sensitive     AMPICILLIN/SULBACTAM <=2 SENSITIVE Sensitive     PIP/TAZO <=4 SENSITIVE Sensitive     * RARE ESCHERICHIA COLI   Enterococcus faecalis - MIC*    AMPICILLIN <=2 SENSITIVE Sensitive     VANCOMYCIN 1 SENSITIVE Sensitive     GENTAMICIN SYNERGY SENSITIVE Sensitive     LINEZOLID 2 SENSITIVE Sensitive     * RARE ENTEROCOCCUS FAECALIS   Serratia marcescens - MIC*  CEFEPIME <=0.12 SENSITIVE Sensitive     CEFTAZIDIME <=1 SENSITIVE Sensitive     CEFTRIAXONE <=0.25 SENSITIVE Sensitive     CIPROFLOXACIN <=0.25 SENSITIVE Sensitive     GENTAMICIN <=1 SENSITIVE Sensitive     TRIMETH/SULFA <=20 SENSITIVE Sensitive     * RARE SERRATIA MARCESCENS    Coagulation Studies: No results for input(s): "LABPROT", "INR" in the last 72 hours.   Urinalysis: No results for input(s): "COLORURINE", "LABSPEC", "PHURINE", "GLUCOSEU", "HGBUR", "BILIRUBINUR", "KETONESUR", "PROTEINUR", "UROBILINOGEN", "NITRITE", "LEUKOCYTESUR" in the last 72 hours.  Invalid input(s): "APPERANCEUR"    Imaging: No results found.   Medications:    ampicillin-sulbactam (UNASYN) IV 3 g (11/17/22 2124)   ciprofloxacin 200 mL/hr at 11/17/22 1807    allopurinol  100 mg Oral Daily   [START ON 11/21/2022] amLODipine  10 mg Oral Weekly   apixaban  5 mg Oral BID   vitamin C  500 mg Oral BID   [START ON 11/21/2022] atenolol  50 mg Oral Weekly   atorvastatin  10 mg Oral Daily   calcium acetate  2,001 mg Oral TID WC   Chlorhexidine Gluconate Cloth  6 each Topical Q0600   cinacalcet  30 mg Oral Daily   clopidogrel  75 mg Oral Daily   docusate sodium  200 mg Oral BID   feeding supplement (NEPRO CARB STEADY)  237 mL Oral TID BM   guaiFENesin  600 mg Oral BID   insulin aspart  0-6 Units Subcutaneous TID WC   insulin glargine-yfgn  10 Units Subcutaneous QHS   mineral oil   Oral Once   multivitamin  1 tablet Oral QHS   polyethylene glycol  17 g Oral Daily   traZODone   50 mg Oral QHS   zinc sulfate  220 mg Oral Daily   albuterol, benzonatate, bisacodyl, calcium acetate, diphenhydrAMINE, famotidine, fentaNYL (SUBLIMAZE) injection, fentaNYL (SUBLIMAZE) injection, lidocaine-prilocaine, loratadine, melatonin, methylPREDNISolone (SOLU-MEDROL) injection, ondansetron (ZOFRAN) IV, ondansetron (ZOFRAN) IV, mouth rinse, oxyCODONE-acetaminophen, polyethylene glycol  Assessment/ Plan:  [name] is a 52 y.o.  male with past medical conditions including anemia, hypertension, diabetes, PVD, sleep apnea, vitamin D deficiency, and end-stage renal disease on hemodialysis.  Patient presents to the hospital for scheduled debridement of left lower venous ulcers.  Patient currently admitted for Gangrene of lower extremity ([place]) [I96]    End stage renal disease on hemodialysis. Continue TTS schedule  Scheduled for dialysis today, currently weighing below dry weight.  Will continue with UF goal of 2 L as tolerated. Next treatment scheduled for Saturday.   2. Anemia of chronic kidney disease.  Lab Results  Component Value Date   HGB 9.2 (L) 11/17/2022    Patient receives Mircera at outpatient clinic.   Will consider low-dose EPO.  3. Secondary Hyperparathyroidism: with outpatient labs: PTH 270, phosphorus 5.0, calcium 9.2 on 11/09/22.   Lab Results  Component Value Date   CALCIUM 8.9 11/17/2022   CAION 1.14 (L) 11/12/2022   PHOS 7.2 (H) 11/16/2022  Will continue to monitor bone minerals during this admission. Continue calcium acetate with meals  4.  Hypertension with chronic kidney disease. Blood pressure at goal. Continue home regimen of amlodipine and atenolol.    Blood pressure 148/71 during dialysis.   LOS: 8 [name] 3/21/20249:49 AM

## 2022-11-19 DIAGNOSIS — I96 Gangrene, not elsewhere classified: Secondary | ICD-10-CM | POA: Diagnosis not present

## 2022-11-19 DIAGNOSIS — E11622 Type 2 diabetes mellitus with other skin ulcer: Secondary | ICD-10-CM | POA: Diagnosis not present

## 2022-11-19 DIAGNOSIS — L97909 Non-pressure chronic ulcer of unspecified part of unspecified lower leg with unspecified severity: Secondary | ICD-10-CM | POA: Diagnosis not present

## 2022-11-19 DIAGNOSIS — I10 Essential (primary) hypertension: Secondary | ICD-10-CM | POA: Diagnosis not present

## 2022-11-19 LAB — GLUCOSE, CAPILLARY
Glucose-Capillary: 92 mg/dL (ref 70–99)
Glucose-Capillary: 116 mg/dL — ABNORMAL HIGH (ref 70–99)
Glucose-Capillary: 99 mg/dL (ref 70–99)
Glucose-Capillary: 120 mg/dL — ABNORMAL HIGH (ref 70–99)

## 2022-11-19 NOTE — Progress Notes (Signed)
[facility]  Daily Progress Note   Subjective  -   The patient is resting comfortably and denies any pain or issues with his current wound VAC placement.  Objective Vitals:   11/18/22 1906 11/19/22 0500 11/19/22 0531 11/19/22 0834  BP: 139/60  (!) 145/69 (!) 144/66  Pulse: 82  72 72  Resp: 20  20 18   Temp: 98.9 F (37.2 C)  98.2 F (36.8 C) 98.2 F (36.8 C)  TempSrc: Oral     SpO2: 96%  96% 96%  Weight:  (!) 178.4 kg    Height:        Intake/Output Summary (Last 24 hours) at 11/19/2022 1625 Last data filed at 11/18/2022 1836 Gross per 24 hour  Intake 235 ml  Output 50 ml  Net 185 ml    PULM  CTAB CV  RRR VASC  3+ edema bilaterally.  Left lower extremity with a wound VAC x 2.  Well sealed.  Laboratory CBC    Component Value Date/Time   WBC 5.1 11/18/2022 0935   HGB 9.5 (L) 11/18/2022 0935   HCT 30.8 (L) 11/18/2022 0935   PLT 176 11/18/2022 0935    BMET    Component Value Date/Time   NA 137 11/18/2022 0935   K 4.4 11/18/2022 0935   CL 99 11/18/2022 0935   CO2 25 11/18/2022 0935   GLUCOSE 132 (H) 11/18/2022 0935   BUN 63 (H) 11/18/2022 0935   CREATININE 12.75 (H) 11/18/2022 0935   CALCIUM 9.2 11/18/2022 0935   GFRNONAA 4 (L) 11/18/2022 0935   GFRAA 9 (L) 02/19/2020 1316    Assessment/Planning: Currently the patient is doing well post recent wound debridement with VAC placement.  He will undergo a third wound washout with VAC placement on Wednesday, 11/24/2022.  He will continue with Unasyn and Cipro for coverage of E. coli, Enterococcus facialis and Serratia.  Following wound washout we will plan on likely discharge with wound VAC changes to be done by home health.  Kris Hartmann  11/19/2022, 4:25 PM

## 2022-11-19 NOTE — Progress Notes (Signed)
[place] [facility] [place] NAME: [name]    MR#:  [id number]  DATE OF BIRTH:  Jun 01, 1971  SUBJECTIVE:  No family today  at bedside S/p application of wound vac in OR 11/17/22 Pt tolerating BP meds   VITALS:  Blood pressure (!) 144/66, pulse 72, temperature 98.2 F (36.8 C), resp. rate 18, height 6\' 4"  (1.93 m), weight (!) 178.4 kg, SpO2 96 %.  PHYSICAL EXAMINATION:   GENERAL:  52 y.o.-year-old patient with no acute distress. obese LUNGS: Normal breath sounds bilaterally, no wheezing CARDIOVASCULAR: S1, S2 normal. No murmur   ABDOMEN: Soft, nontender, nondistended. Bowel sounds present.  EXTREMITIES:  From 11/17/22 NEUROLOGIC: nonfocal  patient is alert and awake SKIN: as above  LABORATORY PANEL:  CBC Recent Labs  Lab 11/18/22 0935  WBC 5.1  HGB 9.5*  HCT 30.8*  PLT 176     Chemistries  Recent Labs  Lab 11/17/22 0603 11/18/22 0935  NA 136 137  K 4.4 4.4  CL 101 99  CO2 28 25  GLUCOSE 151* 132*  BUN 50* 63*  CREATININE 10.26* 12.75*  CALCIUM 8.9 9.2  AST 17  --   ALT <5  --   ALKPHOS 44  --   BILITOT 0.9  --      Assessment and Plan   Gangrene of lower extremity (HCC) --Secondary to PAD and uncontrolled type 2 diabetes. --Status post excisional debridement of 2 left lower extremity venous ulcers with placement of wound VAC dressing to both venous ulcers 03/13 --Patient returned to the OR for further debridement of necrotic tissue 03/15 --Wound cultures-yielded E. coli, Enterococcus faecalis and Serratia Marcescens --Continue IV Zosyn -Optimize glycemic control --Further management per vascular surgery--need to stay till next wed for OR wound vac change per Dr Delana Meyer   Hypertension --pt now on amlodipine and atenolol on NON dialysis days (after d/w pt--dr Kolluru aware)   Diabetes mellitus with hyperglycemia ([place]) --A1c last checked in January 2024, elevated at 8.6 %.   --Patient has recently established  with endocrinology.  -- He is currently on Lantus 10 units at bedtime.   --Blood sugars are stable   End stage renal disease [facility]) - Nephrology consulted --Renal replacement therapy T/TH/S   Reactive airway disease --Patient states that since having COVID approximately 6 weeks ago, he has been experiencing a persistent nonproductive cough and wheezing.    --Continue albuterol as needed --Tessalon Perles and guaifenesin for cough   Hyperprolactinemia ([place]) - Per patient he was told by his endocrinologist to discontinue bromocriptine since his prolactin levels have normalized. -Will hold bromocriptine for now -Patient to follow-up with endocrinology as an out patient   Morbid Obesity (BMI 46.99) Lifestyle modification and exercise has been discussed with patient in detail Complicates overall prognosis and care.       Procedures: wound vac placement CODE STATUS: full DVT Prophylaxis :eliquis Level of care: Med-Surg    TOTAL TIME TAKING CARE OF THIS PATIENT: 30 minutes.  >50% time spent on counselling and coordination of care  Note: This dictation was prepared with Dragon dictation along with smaller phrase technology. Any transcriptional errors that result from this process are unintentional.  Fritzi Mandes M.D    [facility]   CC: Primary care physician; [name], MD

## 2022-11-19 NOTE — Progress Notes (Signed)
[facility]  ROUNDING NOTE   Subjective:   [name] is a 52 year old male with past medical conditions including anemia, hypertension, diabetes, PVD, sleep apnea, vitamin D deficiency, and end-stage renal disease on hemodialysis.  Patient presents to the hospital for scheduled debridement of left lower venous ulcers.  Patient currently admitted for Gangrene of lower extremity ([place]) [I96]  Patient seen sitting up in bed Pain well managed Dialysis tolerated well overnight   Objective:  Vital signs in last 24 hours:  Temp:  [97.7 F (36.5 C)-98.9 F (37.2 C)] 98.2 F (36.8 C) (03/22 0834) Pulse Rate:  [72-85] 72 (03/22 0834) Resp:  [13-20] 18 (03/22 0834) BP: (131-150)/(58-79) 144/66 (03/22 0834) SpO2:  [92 %-99 %] 96 % (03/22 0834) Weight:  [175.9 kg-178.4 kg] 178.4 kg (03/22 0500)  Weight change: 1.7 kg Filed Weights   11/18/22 0834 11/18/22 1310 11/19/22 0500  Weight: (!) 177.8 kg (!) 175.9 kg (!) 178.4 kg    Intake/Output: I/O last 3 completed shifts: In: 235 [IV Piggyback:235] Out: 2050 [Drains:50; Other:2000]   Intake/Output this shift:  No intake/output data recorded.  Physical Exam: General: NAD, laying in bed  Head: Normocephalic, atraumatic. Moist oral mucosal membranes  Eyes: Anicteric  Lungs:  Clear to auscultation, normal effort, room air  Heart: Regular rate and rhythm  Abdomen:  Soft, nontender, obese  Extremities: trace RLE peripheral edema  Neurologic: Nonfocal, moving all four extremities  Skin: No lesions, LLE wounds with wound VAC  Access: Lt AVG    Basic Metabolic Panel: Recent Labs  Lab 11/13/22 0953 11/16/22 0810 11/17/22 0603 11/18/22 0935  NA 135 138 136 137  K 4.9 4.8 4.4 4.4  CL 98 99 101 99  CO2 27 26 28 25   GLUCOSE 141* 99 151* 132*  BUN 61* 68* 50* 63*  CREATININE 11.16* 13.10* 10.26* 12.75*  CALCIUM 8.7* 9.3 8.9 9.2  PHOS 7.5* 7.2*  --  6.9*     Liver Function Tests: Recent Labs  Lab 11/13/22 0953  11/16/22 0810 11/17/22 0603 11/18/22 0935  AST  --   --  17  --   ALT  --   --  <5  --   ALKPHOS  --   --  44  --   BILITOT  --   --  0.9  --   PROT  --   --  6.9  --   ALBUMIN 3.0* 3.1* 3.1* 3.1*    No results for input(s): "LIPASE", "AMYLASE" in the last 168 hours. No results for input(s): "AMMONIA" in the last 168 hours.  CBC: Recent Labs  Lab 11/13/22 0953 11/16/22 0810 11/17/22 0603 11/18/22 0935  WBC 5.3 5.8 4.7 5.1  HGB 9.1* 9.5* 9.2* 9.5*  HCT 29.9* 30.7* 29.4* 30.8*  MCV 102.0* 99.7 99.0 102.0*  PLT 158 170 172 176     Cardiac Enzymes: No results for input(s): "CKTOTAL", "CKMB", "CKMBINDEX", "TROPONINI" in the last 168 hours.  BNP: Invalid input(s): "POCBNP"  CBG: Recent Labs  Lab 11/18/22 1340 11/18/22 1642 11/18/22 2057 11/19/22 0831 11/19/22 1109  GLUCAP 116* 136* 164* 92 116*     Microbiology: Results for orders placed or performed during the hospital encounter of 11/10/22  Aerobic/Anaerobic Culture w Gram Stain (surgical/deep wound)     Status: None   Collection Time: 11/10/22 12:45 PM   Specimen: Leg, Left; Wound  Result Value Ref Range Status   Specimen Description   Final    LEG LEFT CALF WOUND Performed at  [facility], [address], [place], [place] [address]    Special Requests   Final    NONE Performed at [facility], [place], [place] [address]    Gram Stain NO WBC SEEN RARE GRAM NEGATIVE RODS   Final   Culture   Final    FEW ESCHERICHIA COLI RARE ENTEROCOCCUS FAECALIS NO ANAEROBES ISOLATED Performed at [facility], [place] [address], [place], [place] [address]    Report Status 11/15/2022 FINAL  Final   Organism ID, Bacteria ESCHERICHIA COLI  Final   Organism ID, Bacteria ENTEROCOCCUS FAECALIS  Final      Susceptibility   Escherichia coli - MIC*    AMPICILLIN 4 SENSITIVE Sensitive     CEFEPIME <=0.12 SENSITIVE Sensitive     CEFTAZIDIME <=1 SENSITIVE Sensitive     CEFTRIAXONE  <=0.25 SENSITIVE Sensitive     CIPROFLOXACIN <=0.25 SENSITIVE Sensitive     GENTAMICIN <=1 SENSITIVE Sensitive     IMIPENEM <=0.25 SENSITIVE Sensitive     TRIMETH/SULFA >=320 RESISTANT Resistant     AMPICILLIN/SULBACTAM <=2 SENSITIVE Sensitive     PIP/TAZO <=4 SENSITIVE Sensitive     * FEW ESCHERICHIA COLI   Enterococcus faecalis - MIC*    AMPICILLIN <=2 SENSITIVE Sensitive     VANCOMYCIN 1 SENSITIVE Sensitive     GENTAMICIN SYNERGY SENSITIVE Sensitive     LINEZOLID 2 SENSITIVE Sensitive     * RARE ENTEROCOCCUS FAECALIS  Aerobic/Anaerobic Culture w Gram Stain (surgical/deep wound)     Status: None   Collection Time: 11/10/22  1:00 PM   Specimen: Foot, Left; Wound  Result Value Ref Range Status   Specimen Description   Final    FOOT LEFT FOOT Performed at [facility], [address] [place], [place] [address]    Special Requests   Final    NONE Performed at [facility], [place], [place] [address]    Gram Stain NO WBC SEEN NO ORGANISMS SEEN   Final   Culture   Final    RARE ESCHERICHIA COLI RARE SERRATIA MARCESCENS RARE ENTEROCOCCUS FAECALIS NO ANAEROBES ISOLATED Performed at [facility], [address] [place], [place] [address]    Report Status 11/15/2022 FINAL  Final   Organism ID, Bacteria ESCHERICHIA COLI  Final   Organism ID, Bacteria SERRATIA MARCESCENS  Final   Organism ID, Bacteria ENTEROCOCCUS FAECALIS  Final      Susceptibility   Escherichia coli - MIC*    AMPICILLIN <=2 SENSITIVE Sensitive     CEFEPIME <=0.12 SENSITIVE Sensitive     CEFTAZIDIME <=1 SENSITIVE Sensitive     CEFTRIAXONE <=0.25 SENSITIVE Sensitive     CIPROFLOXACIN <=0.25 SENSITIVE Sensitive     GENTAMICIN <=1 SENSITIVE Sensitive     IMIPENEM <=0.25 SENSITIVE Sensitive     TRIMETH/SULFA <=20 SENSITIVE Sensitive     AMPICILLIN/SULBACTAM <=2 SENSITIVE Sensitive     PIP/TAZO <=4 SENSITIVE Sensitive     * RARE ESCHERICHIA COLI   Enterococcus  faecalis - MIC*    AMPICILLIN <=2 SENSITIVE Sensitive     VANCOMYCIN 1 SENSITIVE Sensitive     GENTAMICIN SYNERGY SENSITIVE Sensitive     LINEZOLID 2 SENSITIVE Sensitive     * RARE ENTEROCOCCUS FAECALIS   Serratia marcescens - MIC*    CEFEPIME <=0.12 SENSITIVE Sensitive     CEFTAZIDIME <=1 SENSITIVE Sensitive     CEFTRIAXONE <=0.25 SENSITIVE Sensitive     CIPROFLOXACIN <=0.25 SENSITIVE  Sensitive     GENTAMICIN <=1 SENSITIVE Sensitive     TRIMETH/SULFA <=20 SENSITIVE Sensitive     * RARE SERRATIA MARCESCENS    Coagulation Studies: No results for input(s): "LABPROT", "INR" in the last 72 hours.   Urinalysis: No results for input(s): "COLORURINE", "LABSPEC", "PHURINE", "GLUCOSEU", "HGBUR", "BILIRUBINUR", "KETONESUR", "PROTEINUR", "UROBILINOGEN", "NITRITE", "LEUKOCYTESUR" in the last 72 hours.  Invalid input(s): "APPERANCEUR"    Imaging: No results found.   Medications:    ampicillin-sulbactam (UNASYN) IV 3 g (11/19/22 0211)   ciprofloxacin 400 mg (11/18/22 1805)    allopurinol  100 mg Oral Daily   amLODipine  10 mg Oral Once per day on Sun Mon Wed Fri   apixaban  5 mg Oral BID   vitamin C  500 mg Oral BID   atenolol  50 mg Oral Once per day on Sun Mon Wed Fri   atorvastatin  10 mg Oral Daily   calcium acetate  2,001 mg Oral TID WC   Chlorhexidine Gluconate Cloth  6 each Topical Q0600   cinacalcet  30 mg Oral Daily   clopidogrel  75 mg Oral Daily   docusate sodium  200 mg Oral BID   feeding supplement (NEPRO CARB STEADY)  237 mL Oral TID BM   guaiFENesin  600 mg Oral BID   insulin aspart  0-6 Units Subcutaneous TID WC   insulin glargine-yfgn  10 Units Subcutaneous QHS   mineral oil   Oral Once   multivitamin  1 tablet Oral QHS   polyethylene glycol  17 g Oral Daily   traZODone  50 mg Oral QHS   zinc sulfate  220 mg Oral Daily   albuterol, benzonatate, bisacodyl, calcium acetate, diphenhydrAMINE, famotidine, fentaNYL (SUBLIMAZE) injection, fentaNYL (SUBLIMAZE)  injection, lidocaine-prilocaine, loratadine, melatonin, methylPREDNISolone (SOLU-MEDROL) injection, ondansetron (ZOFRAN) IV, ondansetron (ZOFRAN) IV, mouth rinse, oxyCODONE-acetaminophen, polyethylene glycol  Assessment/ Plan:  [name] is a 52 y.o.  male with past medical conditions including anemia, hypertension, diabetes, PVD, sleep apnea, vitamin D deficiency, and end-stage renal disease on hemodialysis.  Patient presents to the hospital for scheduled debridement of left lower venous ulcers.  Patient currently admitted for Gangrene of lower extremity ([place]) [I96]    End stage renal disease on hemodialysis. Continue TTS schedule  Dialysis yesterday with UF 2L.  Will determine new dry weight prior to discharge Next treatment scheduled for Saturday.   2. Anemia of chronic kidney disease.  Lab Results  Component Value Date   HGB 9.5 (L) 11/18/2022    Patient receives Mircera at outpatient clinic.  Hgb within desired target  3. Secondary Hyperparathyroidism: with outpatient labs: PTH 270, phosphorus 5.0, calcium 9.2 on 11/09/22.   Lab Results  Component Value Date   CALCIUM 9.2 11/18/2022   CAION 1.14 (L) 11/12/2022   PHOS 6.9 (H) 11/18/2022  Will continue to monitor bone minerals during this admission. Continue calcium acetate with meals  4.  Hypertension with chronic kidney disease. Blood pressure at goal. Continue home regimen of amlodipine and atenolol.    Blood pressure acceptable for this patient    LOS: 9 Dashawn Bartnick 3/22/202411:15 AM

## 2022-11-20 DIAGNOSIS — E11622 Type 2 diabetes mellitus with other skin ulcer: Secondary | ICD-10-CM | POA: Diagnosis not present

## 2022-11-20 DIAGNOSIS — I96 Gangrene, not elsewhere classified: Secondary | ICD-10-CM | POA: Diagnosis not present

## 2022-11-20 DIAGNOSIS — I10 Essential (primary) hypertension: Secondary | ICD-10-CM | POA: Diagnosis not present

## 2022-11-20 DIAGNOSIS — L97909 Non-pressure chronic ulcer of unspecified part of unspecified lower leg with unspecified severity: Secondary | ICD-10-CM | POA: Diagnosis not present

## 2022-11-20 LAB — CBC
HCT: 30.7 % — ABNORMAL LOW (ref 39.0–52.0)
Hemoglobin: 9.6 g/dL — ABNORMAL LOW (ref 13.0–17.0)
MCH: 31.3 pg (ref 26.0–34.0)
MCHC: 31.3 g/dL (ref 30.0–36.0)
MCV: 100 fL (ref 80.0–100.0)
Platelets: 179 10*3/uL (ref 150–400)
RBC: 3.07 MIL/uL — ABNORMAL LOW (ref 4.22–5.81)
RDW: 15 % (ref 11.5–15.5)
WBC: 5 10*3/uL (ref 4.0–10.5)
nRBC: 0 % (ref 0.0–0.2)

## 2022-11-20 LAB — GLUCOSE, CAPILLARY
Glucose-Capillary: 124 mg/dL — ABNORMAL HIGH (ref 70–99)
Glucose-Capillary: 139 mg/dL — ABNORMAL HIGH (ref 70–99)
Glucose-Capillary: 111 mg/dL — ABNORMAL HIGH (ref 70–99)
Glucose-Capillary: 127 mg/dL — ABNORMAL HIGH (ref 70–99)

## 2022-11-20 LAB — RENAL FUNCTION PANEL
Albumin: 3.1 g/dL — ABNORMAL LOW (ref 3.5–5.0)
Anion gap: 11 (ref 5–15)
BUN: 69 mg/dL — ABNORMAL HIGH (ref 6–20)
CO2: 26 mmol/L (ref 22–32)
Calcium: 9.2 mg/dL (ref 8.9–10.3)
Chloride: 99 mmol/L (ref 98–111)
Creatinine, Ser: 12.69 mg/dL — ABNORMAL HIGH (ref 0.61–1.24)
GFR, Estimated: 4 mL/min — ABNORMAL LOW (ref >60)
Glucose, Bld: 140 mg/dL — ABNORMAL HIGH (ref 70–99)
Phosphorus: 6 mg/dL — ABNORMAL HIGH (ref 2.5–4.6)
Potassium: 5.1 mmol/L (ref 3.5–5.1)
Sodium: 136 mmol/L (ref 135–145)

## 2022-11-20 NOTE — Progress Notes (Signed)
Subjective: Interval History: none..   Objective: Vital signs in last 24 hours: Temp:  [97.8 F (36.6 C)-98.5 F (36.9 C)] 97.9 F (36.6 C) (03/23 0754) Pulse Rate:  [57-63] 57 (03/23 0754) Resp:  [18-20] 18 (03/23 0754) BP: (134-151)/(66-75) 140/75 (03/23 0754) SpO2:  [94 %-99 %] 99 % (03/23 0754) Weight:  [185.4 kg] 185.4 kg (03/23 0433)  Intake/Output from previous day: 03/22 0701 - 03/23 0700 In: -  Out: 200 [Drains:200] Intake/Output this shift: No intake/output data recorded.  General appearance: alert and no distress Extremities: Chronic brawny edema of the left lower extremity, mild edema on the right Incision/Wound: Negative pressure wound therapy system in place at the lateral foot and medial calf.  Good seal.  Lab Results: Recent Labs    11/18/22 0935  WBC 5.1  HGB 9.5*  HCT 30.8*  PLT 176   BMET Recent Labs    11/18/22 0935  NA 137  K 4.4  CL 99  CO2 25  GLUCOSE 132*  BUN 63*  CREATININE 12.75*  CALCIUM 9.2    Studies/Results: DG Chest 2 View  Result Date: 11/02/2022 CLINICAL DATA:  Shortness of breath EXAM: CHEST - 2 VIEW COMPARISON:  Chest radiograph 09/27/2022 FINDINGS: Stable cardiac and mediastinal contours. No consolidative pulmonary opacities. No pleural effusion or pneumothorax. Thoracic spine degenerative changes. IMPRESSION: No active cardiopulmonary disease. Electronically Signed   By: Lovey Newcomer M.D.   On: 11/02/2022 22:58   Anti-infectives: Anti-infectives (From admission, onward)    Start     Dose/Rate Route Frequency Ordered Stop   11/17/22 2000  Ampicillin-Sulbactam (UNASYN) 3 g in sodium chloride 0.9 % 100 mL IVPB        3 g 200 mL/hr over 30 Minutes Intravenous Every 12 hours 11/17/22 1634     11/17/22 1800  ciprofloxacin (CIPRO) IVPB 400 mg        400 mg 200 mL/hr over 60 Minutes Intravenous Daily-1800 11/17/22 1634     11/17/22 1012  ceFAZolin (ANCEF) IVPB 2g/100 mL premix  Status:  Discontinued        2 g 200 mL/hr over  30 Minutes Intravenous 30 min pre-op 11/17/22 1014 11/17/22 1245   11/12/22 1400  piperacillin-tazobactam (ZOSYN) IVPB 4.5 g  Status:  Discontinued       Note to Pharmacy: First dose STAT   4.5 g 200 mL/hr over 30 Minutes Intravenous Every 8 hours 11/12/22 1212 11/12/22 1222   11/12/22 1400  piperacillin-tazobactam (ZOSYN) IVPB 3.375 g  Status:  Discontinued        3.375 g 12.5 mL/hr over 240 Minutes Intravenous Every 8 hours 11/12/22 1222 11/12/22 1236   11/12/22 1400  piperacillin-tazobactam (ZOSYN) IVPB 2.25 g  Status:  Discontinued        2.25 g 100 mL/hr over 30 Minutes Intravenous Every 8 hours 11/12/22 1236 11/17/22 1634   11/12/22 1004  ceFAZolin (ANCEF) 1-4 GM/50ML-% IVPB       Note to Pharmacy: Olena Mater F: cabinet override      11/12/22 1004 11/12/22 1308   11/12/22 0901  ceFAZolin (ANCEF) 2-4 GM/100ML-% IVPB       Note to Pharmacy: [facility] C: cabinet override      11/12/22 0901 11/12/22 1100   11/11/22 2217  ceFAZolin (ANCEF) IVPB 2g/100 mL premix        2 g 200 mL/hr over 30 Minutes Intravenous 30 min pre-op 11/11/22 2217 11/12/22 1100   11/10/22 1105  ceFAZolin (ANCEF) 2-4 GM/100ML-% IVPB  Note to Pharmacy: Sylvester Harder P: cabinet override      11/10/22 1105 11/10/22 1223   11/10/22 0600  ceFAZolin (ANCEF) IVPB 2g/100 mL premix        2 g 200 mL/hr over 30 Minutes Intravenous On call to O.R. 11/09/22 2215 11/10/22 1249       Assessment/Plan: s/p Procedure(s): DEBRIDEMENT WOUND (Left) Wound vac exchange (Left) Continue current plans.  Return to the OR middle of the week for additional washout and negative pressure wound therapy replacement.   LOS: 10 days   Bertram Savin 11/20/2022, 10:47 AM

## 2022-11-20 NOTE — Progress Notes (Addendum)
Hemodialysis note  Received patient in bed to unit. Alert and oriented.  Informed consent signed and in chart.  Treatment initiated: 1415 Treatment completed: 1800  Patient tolerated well. Transported back to room, alert without acute distress.  Report given to patient's RN.   Access used: Left Upper Arm AVG Access issues: none  Total UF removed: 2L Medication(s) given:  none  Post HD weight: 176.2 kg   [place] Constable RN [facility]

## 2022-11-20 NOTE — Progress Notes (Signed)
[place] at [place] NAME: [name]    MR#:  [id number]  DATE OF BIRTH:  [birth year]  SUBJECTIVE:  No family today  at bedside S/p application of wound vac in OR 11/17/22 Pt tolerating BP meds   VITALS:  Blood pressure (!) 140/75, pulse (!) 57, temperature 97.9 F (36.6 C), temperature source Oral, resp. rate 18, height 6\' 4"  (1.93 m), weight (!) 185.4 kg, SpO2 99 %.  PHYSICAL EXAMINATION:   GENERAL:  52 y.o.-year-old patient with no acute distress. obese LUNGS: Normal breath sounds bilaterally, no wheezing CARDIOVASCULAR: S1, S2 normal. No murmur   ABDOMEN: Soft, nontender, nondistended. Bowel sounds present.  EXTREMITIES:  From 11/17/22 NEUROLOGIC: nonfocal  patient is alert and awake SKIN: as above  LABORATORY PANEL:  CBC Recent Labs  Lab 11/18/22 0935  WBC 5.1  HGB 9.5*  HCT 30.8*  PLT 176     Chemistries  Recent Labs  Lab 11/17/22 0603 11/18/22 0935  NA 136 137  K 4.4 4.4  CL 101 99  CO2 28 25  GLUCOSE 151* 132*  BUN 50* 63*  CREATININE 10.26* 12.75*  CALCIUM 8.9 9.2  AST 17  --   ALT <5  --   ALKPHOS 44  --   BILITOT 0.9  --      Assessment and Plan   Gangrene of lower extremity (HCC) --Secondary to PAD and uncontrolled type 2 diabetes. --Status post excisional debridement of 2 left lower extremity venous ulcers with placement of wound VAC dressing to both venous ulcers 03/13 --Patient returned to the OR for further debridement of necrotic tissue 03/15 --Wound cultures-yielded E. coli, Enterococcus faecalis and Serratia Marcescens --Continue IV Zosyn -Optimize glycemic control --Further management per vascular surgery--need to stay till next wed for OR wound vac change per Dr Delana Meyer   Hypertension --pt now on amlodipine and atenolol on NON dialysis days (after d/w pt--dr Kolluru aware)   Diabetes mellitus with hyperglycemia ([place]) --A1c last checked in January 2024, elevated at 8.6 %.    --Patient has recently established with endocrinology.  -- He is currently on Lantus 10 units at bedtime.   --Blood sugars are stable   End stage renal disease [facility]) - Nephrology consulted --Renal replacement therapy T/TH/S   Reactive airway disease --Patient states that since having COVID approximately 6 weeks ago, he has been experiencing a persistent nonproductive cough and wheezing.    --Continue albuterol as needed --Tessalon Perles and guaifenesin for cough   Hyperprolactinemia ([place]) - Per patient he was told by his endocrinologist to discontinue bromocriptine since his prolactin levels have normalized. -Will hold bromocriptine for now -Patient to follow-up with endocrinology as an out patient   Morbid Obesity (BMI 46.99) Lifestyle modification and exercise has been discussed with patient in detail Complicates overall prognosis and care.       Procedures: wound vac placement CODE STATUS: full DVT Prophylaxis :eliquis Level of care: Med-Surg    TOTAL TIME TAKING CARE OF THIS PATIENT: 30 minutes.  >50% time spent on counselling and coordination of care  Note: This dictation was prepared with Dragon dictation along with smaller phrase technology. Any transcriptional errors that result from this process are unintentional.  Fritzi Mandes M.D    [facility]   CC: Primary care physician; [name], MD

## 2022-11-20 NOTE — Progress Notes (Signed)
[facility]  ROUNDING NOTE   Subjective:   [name] is a 52 year old male with past medical conditions including anemia, hypertension, diabetes, PVD, sleep apnea, vitamin D deficiency, and end-stage renal disease on hemodialysis.  Patient presents to the hospital for scheduled debridement of left lower venous ulcers.  Patient currently admitted for Gangrene of lower extremity ([place]) [I96]  Patient seen sitting up in bed Pain well managed Reports he doesn't have much of an appetite currently because of his stomach feeling "gurgly". Feels like it may be caused by antibiotics.  Reports vascular is planning for procedure on Wednesday    Objective:  Vital signs in last 24 hours:  Temp:  [97.8 F (36.6 C)-98.5 F (36.9 C)] 97.9 F (36.6 C) (03/23 0754) Pulse Rate:  [57-63] 57 (03/23 0754) Resp:  [18-20] 18 (03/23 0754) BP: (134-151)/(66-75) 140/75 (03/23 0754) SpO2:  [94 %-99 %] 99 % (03/23 0754) Weight:  [185.4 kg] 185.4 kg (03/23 0433)  Weight change: 7.6 kg Filed Weights   11/18/22 1310 11/19/22 0500 11/20/22 0433  Weight: (!) 175.9 kg (!) 178.4 kg (!) 185.4 kg    Intake/Output: I/O last 3 completed shifts: In: -  Out: 250 [Drains:250]   Intake/Output this shift:  No intake/output data recorded.  Physical Exam: General: NAD, laying in bed  Head: Normocephalic, atraumatic. Moist oral mucosal membranes  Eyes: Anicteric  Lungs:  Clear to auscultation, normal effort, room air  Heart: Regular rate and rhythm  Abdomen:  Soft, nontender, obese  Extremities: trace RLE peripheral edema  Neurologic: Nonfocal, moving all four extremities  Skin: No lesions, LLE wounds with wound VAC. +edema   Access: Lt AVG    Basic Metabolic Panel: Recent Labs  Lab 11/16/22 0810 11/17/22 0603 11/18/22 0935  NA 138 136 137  K 4.8 4.4 4.4  CL 99 101 99  CO2 26 28 25   GLUCOSE 99 151* 132*  BUN 68* 50* 63*  CREATININE 13.10* 10.26* 12.75*  CALCIUM 9.3 8.9 9.2  PHOS  7.2*  --  6.9*     Liver Function Tests: Recent Labs  Lab 11/16/22 0810 11/17/22 0603 11/18/22 0935  AST  --  17  --   ALT  --  <5  --   ALKPHOS  --  44  --   BILITOT  --  0.9  --   PROT  --  6.9  --   ALBUMIN 3.1* 3.1* 3.1*    No results for input(s): "LIPASE", "AMYLASE" in the last 168 hours. No results for input(s): "AMMONIA" in the last 168 hours.  CBC: Recent Labs  Lab 11/16/22 0810 11/17/22 0603 11/18/22 0935  WBC 5.8 4.7 5.1  HGB 9.5* 9.2* 9.5*  HCT 30.7* 29.4* 30.8*  MCV 99.7 99.0 102.0*  PLT 170 172 176     Cardiac Enzymes: No results for input(s): "CKTOTAL", "CKMB", "CKMBINDEX", "TROPONINI" in the last 168 hours.  BNP: Invalid input(s): "POCBNP"  CBG: Recent Labs  Lab 11/19/22 1109 11/19/22 1624 11/19/22 2100 11/20/22 0754 11/20/22 1140  GLUCAP 116* 99 120* 124* 139*     Microbiology: Results for orders placed or performed during the hospital encounter of 11/10/22  Aerobic/Anaerobic Culture w Gram Stain (surgical/deep wound)     Status: None   Collection Time: 11/10/22 12:45 PM   Specimen: Leg, Left; Wound  Result Value Ref Range Status   Specimen Description   Final    LEG LEFT CALF WOUND Performed at [facility], [place],  [place] [address]    Special Requests   Final    NONE Performed at [facility], [place], [place] [address]    Gram Stain NO WBC SEEN RARE GRAM NEGATIVE RODS   Final   Culture   Final    FEW ESCHERICHIA COLI RARE ENTEROCOCCUS FAECALIS NO ANAEROBES ISOLATED Performed at [facility], [place] [address], [place], [place] [address]    Report Status 11/15/2022 FINAL  Final   Organism ID, Bacteria ESCHERICHIA COLI  Final   Organism ID, Bacteria ENTEROCOCCUS FAECALIS  Final      Susceptibility   Escherichia coli - MIC*    AMPICILLIN 4 SENSITIVE Sensitive     CEFEPIME <=0.12 SENSITIVE Sensitive     CEFTAZIDIME <=1 SENSITIVE Sensitive     CEFTRIAXONE <=0.25  SENSITIVE Sensitive     CIPROFLOXACIN <=0.25 SENSITIVE Sensitive     GENTAMICIN <=1 SENSITIVE Sensitive     IMIPENEM <=0.25 SENSITIVE Sensitive     TRIMETH/SULFA >=320 RESISTANT Resistant     AMPICILLIN/SULBACTAM <=2 SENSITIVE Sensitive     PIP/TAZO <=4 SENSITIVE Sensitive     * FEW ESCHERICHIA COLI   Enterococcus faecalis - MIC*    AMPICILLIN <=2 SENSITIVE Sensitive     VANCOMYCIN 1 SENSITIVE Sensitive     GENTAMICIN SYNERGY SENSITIVE Sensitive     LINEZOLID 2 SENSITIVE Sensitive     * RARE ENTEROCOCCUS FAECALIS  Aerobic/Anaerobic Culture w Gram Stain (surgical/deep wound)     Status: None   Collection Time: 11/10/22  1:00 PM   Specimen: Foot, Left; Wound  Result Value Ref Range Status   Specimen Description   Final    FOOT LEFT FOOT Performed at [facility], [address] [place], [place] [address]    Special Requests   Final    NONE Performed at [facility], [place], [place] [address]    Gram Stain NO WBC SEEN NO ORGANISMS SEEN   Final   Culture   Final    RARE ESCHERICHIA COLI RARE SERRATIA MARCESCENS RARE ENTEROCOCCUS FAECALIS NO ANAEROBES ISOLATED Performed at [facility], [address], [place], [place] [address]    Report Status 11/15/2022 FINAL  Final   Organism ID, Bacteria ESCHERICHIA COLI  Final   Organism ID, Bacteria SERRATIA MARCESCENS  Final   Organism ID, Bacteria ENTEROCOCCUS FAECALIS  Final      Susceptibility   Escherichia coli - MIC*    AMPICILLIN <=2 SENSITIVE Sensitive     CEFEPIME <=0.12 SENSITIVE Sensitive     CEFTAZIDIME <=1 SENSITIVE Sensitive     CEFTRIAXONE <=0.25 SENSITIVE Sensitive     CIPROFLOXACIN <=0.25 SENSITIVE Sensitive     GENTAMICIN <=1 SENSITIVE Sensitive     IMIPENEM <=0.25 SENSITIVE Sensitive     TRIMETH/SULFA <=20 SENSITIVE Sensitive     AMPICILLIN/SULBACTAM <=2 SENSITIVE Sensitive     PIP/TAZO <=4 SENSITIVE Sensitive     * RARE ESCHERICHIA COLI   Enterococcus faecalis -  MIC*    AMPICILLIN <=2 SENSITIVE Sensitive     VANCOMYCIN 1 SENSITIVE Sensitive     GENTAMICIN SYNERGY SENSITIVE Sensitive     LINEZOLID 2 SENSITIVE Sensitive     * RARE ENTEROCOCCUS FAECALIS   Serratia marcescens - MIC*    CEFEPIME <=0.12 SENSITIVE Sensitive     CEFTAZIDIME <=1 SENSITIVE Sensitive     CEFTRIAXONE <=0.25 SENSITIVE Sensitive     CIPROFLOXACIN <=0.25 SENSITIVE Sensitive     GENTAMICIN <=1 SENSITIVE  Sensitive     TRIMETH/SULFA <=20 SENSITIVE Sensitive     * RARE SERRATIA MARCESCENS    Coagulation Studies: No results for input(s): "LABPROT", "INR" in the last 72 hours.   Urinalysis: No results for input(s): "COLORURINE", "LABSPEC", "PHURINE", "GLUCOSEU", "HGBUR", "BILIRUBINUR", "KETONESUR", "PROTEINUR", "UROBILINOGEN", "NITRITE", "LEUKOCYTESUR" in the last 72 hours.  Invalid input(s): "APPERANCEUR"    Imaging: No results found.   Medications:    ampicillin-sulbactam (UNASYN) IV 3 g (11/20/22 0202)   ciprofloxacin 400 mg (11/19/22 1708)    allopurinol  100 mg Oral Daily   amLODipine  10 mg Oral Once per day on Sun Mon Wed Fri   apixaban  5 mg Oral BID   vitamin C  500 mg Oral BID   atenolol  50 mg Oral Once per day on Sun Mon Wed Fri   atorvastatin  10 mg Oral Daily   calcium acetate  2,001 mg Oral TID WC   Chlorhexidine Gluconate Cloth  6 each Topical Q0600   cinacalcet  30 mg Oral Daily   clopidogrel  75 mg Oral Daily   docusate sodium  200 mg Oral BID   feeding supplement (NEPRO CARB STEADY)  237 mL Oral TID BM   guaiFENesin  600 mg Oral BID   insulin aspart  0-6 Units Subcutaneous TID WC   insulin glargine-yfgn  10 Units Subcutaneous QHS   multivitamin  1 tablet Oral QHS   polyethylene glycol  17 g Oral Daily   traZODone  50 mg Oral QHS   zinc sulfate  220 mg Oral Daily   albuterol, benzonatate, bisacodyl, calcium acetate, diphenhydrAMINE, lidocaine-prilocaine, loratadine, melatonin, ondansetron (ZOFRAN) IV, mouth rinse,  oxyCODONE-acetaminophen  Assessment/ Plan:  Mr. KYLA TIMM is a 52 y.o.  male with past medical conditions including anemia, hypertension, diabetes, PVD, sleep apnea, vitamin D deficiency, and end-stage renal disease on hemodialysis.  Patient presents to the hospital for scheduled debridement of left lower venous ulcers.  Patient currently admitted for Gangrene of lower extremity ([place]) [I96]    End stage renal disease on hemodialysis. Continue TTS schedule  Plan for dialysis later today  Will determine new dry weight prior to discharge Next treatment scheduled for Tuesday   2. Anemia of chronic kidney disease.  Lab Results  Component Value Date   HGB 9.5 (L) 11/18/2022    Patient receives Mircera at outpatient clinic.  Hgb within desired target  3. Secondary Hyperparathyroidism: with outpatient labs: PTH 270 (3/12), phosphorus 6.9, calcium 9.2 .  Lab Results  Component Value Date   CALCIUM 9.2 11/18/2022   CAION 1.14 (L) 11/12/2022   PHOS 6.9 (H) 11/18/2022  Will continue to monitor bone minerals during this admission. Continue calcium acetate with meals  4.  Hypertension with chronic kidney disease. Blood pressure at goal. Continue home regimen of amlodipine and atenolol.  BP 140/75      LOS: 10 Tamala Manzer P Delitha Elms 3/23/202411:45 AM

## 2022-11-21 DIAGNOSIS — I96 Gangrene, not elsewhere classified: Secondary | ICD-10-CM | POA: Diagnosis not present

## 2022-11-21 DIAGNOSIS — N2581 Secondary hyperparathyroidism of renal origin: Secondary | ICD-10-CM | POA: Diagnosis not present

## 2022-11-21 DIAGNOSIS — L97909 Non-pressure chronic ulcer of unspecified part of unspecified lower leg with unspecified severity: Secondary | ICD-10-CM | POA: Diagnosis not present

## 2022-11-21 DIAGNOSIS — Z992 Dependence on renal dialysis: Secondary | ICD-10-CM | POA: Diagnosis not present

## 2022-11-21 DIAGNOSIS — N186 End stage renal disease: Secondary | ICD-10-CM | POA: Diagnosis not present

## 2022-11-21 DIAGNOSIS — E11622 Type 2 diabetes mellitus with other skin ulcer: Secondary | ICD-10-CM | POA: Diagnosis not present

## 2022-11-21 DIAGNOSIS — I10 Essential (primary) hypertension: Secondary | ICD-10-CM | POA: Diagnosis not present

## 2022-11-21 LAB — GLUCOSE, CAPILLARY
Glucose-Capillary: 109 mg/dL — ABNORMAL HIGH (ref 70–99)
Glucose-Capillary: 116 mg/dL — ABNORMAL HIGH (ref 70–99)
Glucose-Capillary: 127 mg/dL — ABNORMAL HIGH (ref 70–99)
Glucose-Capillary: 122 mg/dL — ABNORMAL HIGH (ref 70–99)

## 2022-11-21 MED ORDER — SODIUM CHLORIDE 0.9 % IV SOLN: INTRAVENOUS | Status: DC | PRN

## 2022-11-21 NOTE — Progress Notes (Signed)
[place] at [place] NAME: [name]    MR#:  [id number]  DATE OF BIRTH:  13-Jul-1971  SUBJECTIVE:  No family today  at bedside S/p application of wound vac in OR 11/17/22 Pt tolerating BP meds   VITALS:  Blood pressure 135/66, pulse (!) 58, temperature 98 F (36.7 C), temperature source Oral, resp. rate 18, height 6\' 4"  (1.93 m), weight (!) 177.2 kg, SpO2 95 %.  PHYSICAL EXAMINATION:   GENERAL:  52 y.o.-year-old patient with no acute distress. obese LUNGS: Normal breath sounds bilaterally, no wheezing CARDIOVASCULAR: S1, S2 normal.  ABDOMEN: Soft, nontender, nondistended. Bowel sounds present.  EXTREMITIES: wound vac x 2 places on left LE NEUROLOGIC: nonfocal  patient is alert and awake SKIN: as above  LABORATORY PANEL:  CBC Recent Labs  Lab 11/20/22 1416  WBC 5.0  HGB 9.6*  HCT 30.7*  PLT 179     Chemistries  Recent Labs  Lab 11/17/22 0603 11/18/22 0935 11/20/22 1416  NA 136   < > 136  K 4.4   < > 5.1  CL 101   < > 99  CO2 28   < > 26  GLUCOSE 151*   < > 140*  BUN 50*   < > 69*  CREATININE 10.26*   < > 12.69*  CALCIUM 8.9   < > 9.2  AST 17  --   --   ALT <5  --   --   ALKPHOS 44  --   --   BILITOT 0.9  --   --    < > = values in this interval not displayed.     Assessment and Plan   Gangrene of lower extremity ([place]) --Secondary to PAD and uncontrolled type 2 diabetes. --Status post excisional debridement of 2 left lower extremity venous ulcers with placement of wound VAC dressing to both venous ulcers 03/13 --Patient returned to the OR for further debridement of necrotic tissue 03/15 --Wound cultures-yielded E. coli, Enterococcus faecalis and Serratia Marcescens --Continue IV Zosyn -Optimize glycemic control --Further management per vascular surgery--need to stay till next wed for OR wound vac change per Dr Delana Meyer   Hypertension --pt now on amlodipine and atenolol on NON dialysis days (after d/w  pt--dr Kolluru aware) --bp much better   Diabetes mellitus with hyperglycemia ([place]) --A1c last checked in January 2024, elevated at 8.6 %.   --Patient has recently established with endocrinology.  -- He is currently on Lantus 10 units at bedtime.   --Blood sugars are stable   End stage renal disease [facility]) - Nephrology consulted --Renal replacement therapy T/TH/S   Reactive airway disease --Patient states that since having COVID approximately 6 weeks ago, he has been experiencing a persistent nonproductive cough and wheezing.    --Continue albuterol as needed --Tessalon Perles and guaifenesin for cough   Hyperprolactinemia ([place]) - Per patient he was told by his endocrinologist to discontinue bromocriptine since his prolactin levels have normalized. -Will hold bromocriptine for now -Patient to follow-up with endocrinology as an out patient   Morbid Obesity (BMI 46.99) Lifestyle modification and exercise has been discussed with patient in detail Complicates overall prognosis and care.       Procedures: wound vac placement CODE STATUS: full DVT Prophylaxis :eliquis Level of care: Med-Surg    TOTAL TIME TAKING CARE OF THIS PATIENT: 30 minutes.  >50% time spent on counselling and coordination of care  Note: This dictation was prepared with Dragon dictation  along with smaller phrase technology. Any transcriptional errors that result from this process are unintentional.  Fritzi Mandes M.D    [facility]   CC: Primary care physician; [name], MD

## 2022-11-21 NOTE — Progress Notes (Signed)
[facility]  ROUNDING NOTE   Subjective:   [name] is a 52 year old male with past medical conditions including anemia, hypertension, diabetes, PVD, sleep apnea, vitamin D deficiency, and end-stage renal disease on hemodialysis.  Patient presents to the hospital for scheduled debridement of left lower venous ulcers.  Patient currently admitted for Gangrene of lower extremity ([place]) [I96]  Patient seen sitting up in bed Pain well managed States his stomach is feeling better today  Denies any difficulties with HD yesterday   Reports vascular is planning for procedure on Wednesday    Objective:  Vital signs in last 24 hours:  Temp:  [98 F (36.7 C)-98.4 F (36.9 C)] 98 F (36.7 C) (03/24 0805) Pulse Rate:  [54-63] 58 (03/24 0805) Resp:  [15-26] 18 (03/24 0805) BP: (126-166)/(66-88) 135/66 (03/24 0805) SpO2:  [95 %-100 %] 95 % (03/24 0805) Weight:  [176.2 kg-178 kg] 177.2 kg (03/24 0500)  Weight change: -7.4 kg Filed Weights   11/20/22 1354 11/20/22 1809 11/21/22 0500  Weight: (!) 178 kg (!) 176.2 kg (!) 177.2 kg    Intake/Output: I/O last 3 completed shifts: In: -  Out: 2400 [Drains:400; Other:2000]   Intake/Output this shift:  No intake/output data recorded.  Physical Exam: General: NAD, laying in bed  Head: Normocephalic, atraumatic. Moist oral mucosal membranes  Eyes: Anicteric  Lungs:  Clear to auscultation, normal effort, room air  Heart: Regular rate and rhythm  Abdomen:  Soft, nontender, obese  Extremities: trace RLE peripheral edema  Neurologic: Nonfocal, moving all four extremities  Skin: No lesions, LLE wounds with wound VAC. +edema   Access: Lt AVG    Basic Metabolic Panel: Recent Labs  Lab 11/16/22 0810 11/17/22 0603 11/18/22 0935 11/20/22 1416  NA 138 136 137 136  K 4.8 4.4 4.4 5.1  CL 99 101 99 99  CO2 26 28 25 26   GLUCOSE 99 151* 132* 140*  BUN 68* 50* 63* 69*  CREATININE 13.10* 10.26* 12.75* 12.69*  CALCIUM 9.3 8.9 9.2  9.2  PHOS 7.2*  --  6.9* 6.0*     Liver Function Tests: Recent Labs  Lab 11/16/22 0810 11/17/22 0603 11/18/22 0935 11/20/22 1416  AST  --  17  --   --   ALT  --  <5  --   --   ALKPHOS  --  44  --   --   BILITOT  --  0.9  --   --   PROT  --  6.9  --   --   ALBUMIN 3.1* 3.1* 3.1* 3.1*    No results for input(s): "LIPASE", "AMYLASE" in the last 168 hours. No results for input(s): "AMMONIA" in the last 168 hours.  CBC: Recent Labs  Lab 11/16/22 0810 11/17/22 0603 11/18/22 0935 11/20/22 1416  WBC 5.8 4.7 5.1 5.0  HGB 9.5* 9.2* 9.5* 9.6*  HCT 30.7* 29.4* 30.8* 30.7*  MCV 99.7 99.0 102.0* 100.0  PLT 170 172 176 179     Cardiac Enzymes: No results for input(s): "CKTOTAL", "CKMB", "CKMBINDEX", "TROPONINI" in the last 168 hours.  BNP: Invalid input(s): "POCBNP"  CBG: Recent Labs  Lab 11/20/22 0754 11/20/22 1140 11/20/22 1858 11/20/22 2138 11/21/22 0807  GLUCAP 124* 139* 111* 127* 109*     Microbiology: Results for orders placed or performed during the hospital encounter of 11/10/22  Aerobic/Anaerobic Culture w Gram Stain (surgical/deep wound)     Status: None   Collection Time: 11/10/22 12:45 PM   Specimen: Leg, Left; Wound  Result Value Ref Range Status   Specimen Description   Final    LEG LEFT CALF WOUND Performed at [facility], [address] [place], [place] [address]    Special Requests   Final    NONE Performed at [facility], [address] [place], [place] [address]    Gram Stain NO WBC SEEN RARE GRAM NEGATIVE RODS   Final   Culture   Final    FEW ESCHERICHIA COLI RARE ENTEROCOCCUS FAECALIS NO ANAEROBES ISOLATED Performed at [facility], [place] [address], [place], [place] [address]    Report Status 11/15/2022 FINAL  Final   Organism ID, Bacteria ESCHERICHIA COLI  Final   Organism ID, Bacteria ENTEROCOCCUS FAECALIS  Final      Susceptibility   Escherichia coli - MIC*    AMPICILLIN 4 SENSITIVE Sensitive      CEFEPIME <=0.12 SENSITIVE Sensitive     CEFTAZIDIME <=1 SENSITIVE Sensitive     CEFTRIAXONE <=0.25 SENSITIVE Sensitive     CIPROFLOXACIN <=0.25 SENSITIVE Sensitive     GENTAMICIN <=1 SENSITIVE Sensitive     IMIPENEM <=0.25 SENSITIVE Sensitive     TRIMETH/SULFA >=320 RESISTANT Resistant     AMPICILLIN/SULBACTAM <=2 SENSITIVE Sensitive     PIP/TAZO <=4 SENSITIVE Sensitive     * FEW ESCHERICHIA COLI   Enterococcus faecalis - MIC*    AMPICILLIN <=2 SENSITIVE Sensitive     VANCOMYCIN 1 SENSITIVE Sensitive     GENTAMICIN SYNERGY SENSITIVE Sensitive     LINEZOLID 2 SENSITIVE Sensitive     * RARE ENTEROCOCCUS FAECALIS  Aerobic/Anaerobic Culture w Gram Stain (surgical/deep wound)     Status: None   Collection Time: 11/10/22  1:00 PM   Specimen: Foot, Left; Wound  Result Value Ref Range Status   Specimen Description   Final    FOOT LEFT FOOT Performed at [facility], [address], [place], [place] [address]    Special Requests   Final    NONE Performed at [facility], [place], [place] [address]    Gram Stain NO WBC SEEN NO ORGANISMS SEEN   Final   Culture   Final    RARE ESCHERICHIA COLI RARE SERRATIA MARCESCENS RARE ENTEROCOCCUS FAECALIS NO ANAEROBES ISOLATED Performed at [facility], [address], [place], [place] [address]    Report Status 11/15/2022 FINAL  Final   Organism ID, Bacteria ESCHERICHIA COLI  Final   Organism ID, Bacteria SERRATIA MARCESCENS  Final   Organism ID, Bacteria ENTEROCOCCUS FAECALIS  Final      Susceptibility   Escherichia coli - MIC*    AMPICILLIN <=2 SENSITIVE Sensitive     CEFEPIME <=0.12 SENSITIVE Sensitive     CEFTAZIDIME <=1 SENSITIVE Sensitive     CEFTRIAXONE <=0.25 SENSITIVE Sensitive     CIPROFLOXACIN <=0.25 SENSITIVE Sensitive     GENTAMICIN <=1 SENSITIVE Sensitive     IMIPENEM <=0.25 SENSITIVE Sensitive     TRIMETH/SULFA <=20 SENSITIVE Sensitive     AMPICILLIN/SULBACTAM <=2 SENSITIVE  Sensitive     PIP/TAZO <=4 SENSITIVE Sensitive     * RARE ESCHERICHIA COLI   Enterococcus faecalis - MIC*    AMPICILLIN <=2 SENSITIVE Sensitive     VANCOMYCIN 1 SENSITIVE Sensitive     GENTAMICIN SYNERGY SENSITIVE Sensitive     LINEZOLID 2 SENSITIVE Sensitive     * RARE ENTEROCOCCUS FAECALIS   Serratia marcescens - MIC*    CEFEPIME <=0.12 SENSITIVE Sensitive  CEFTAZIDIME <=1 SENSITIVE Sensitive     CEFTRIAXONE <=0.25 SENSITIVE Sensitive     CIPROFLOXACIN <=0.25 SENSITIVE Sensitive     GENTAMICIN <=1 SENSITIVE Sensitive     TRIMETH/SULFA <=20 SENSITIVE Sensitive     * RARE SERRATIA MARCESCENS    Coagulation Studies: No results for input(s): "LABPROT", "INR" in the last 72 hours.   Urinalysis: No results for input(s): "COLORURINE", "LABSPEC", "PHURINE", "GLUCOSEU", "HGBUR", "BILIRUBINUR", "KETONESUR", "PROTEINUR", "UROBILINOGEN", "NITRITE", "LEUKOCYTESUR" in the last 72 hours.  Invalid input(s): "APPERANCEUR"    Imaging: No results found.   Medications:    ampicillin-sulbactam (UNASYN) IV 3 g (11/20/22 1843)   ciprofloxacin 400 mg (11/20/22 1934)    allopurinol  100 mg Oral Daily   amLODipine  10 mg Oral Once per day on Sun Mon Wed Fri   apixaban  5 mg Oral BID   vitamin C  500 mg Oral BID   atenolol  50 mg Oral Once per day on Sun Mon Wed Fri   atorvastatin  10 mg Oral Daily   calcium acetate  2,001 mg Oral TID WC   Chlorhexidine Gluconate Cloth  6 each Topical Q0600   cinacalcet  30 mg Oral Daily   clopidogrel  75 mg Oral Daily   docusate sodium  200 mg Oral BID   feeding supplement (NEPRO CARB STEADY)  237 mL Oral TID BM   guaiFENesin  600 mg Oral BID   insulin aspart  0-6 Units Subcutaneous TID WC   insulin glargine-yfgn  10 Units Subcutaneous QHS   multivitamin  1 tablet Oral QHS   polyethylene glycol  17 g Oral Daily   traZODone  50 mg Oral QHS   zinc sulfate  220 mg Oral Daily   albuterol, benzonatate, bisacodyl, calcium acetate, diphenhydrAMINE,  lidocaine-prilocaine, loratadine, melatonin, ondansetron (ZOFRAN) IV, mouth rinse, oxyCODONE-acetaminophen  Assessment/ Plan:  [name] is a 52 y.o.  male with past medical conditions including anemia, hypertension, diabetes, PVD, sleep apnea, vitamin D deficiency, and end-stage renal disease on hemodialysis.  Patient presents to the hospital for scheduled debridement of left lower venous ulcers.  Patient currently admitted for Gangrene of lower extremity ([place]) [I96]    End stage renal disease on hemodialysis. Continue TTS schedule  Plan for dialysis on his regular schedule, TTS , next treatment scheduled for Tuesday  Will determine new dry weight prior to discharge   2. Anemia of chronic kidney disease.  Lab Results  Component Value Date   HGB 9.6 (L) 11/20/2022    Patient receives Mircera at outpatient clinic.  Hgb within desired target  3. Secondary Hyperparathyroidism: with outpatient labs: PTH 270 (3/12), phosphorus 6.0, calcium 9.2 .  Lab Results  Component Value Date   CALCIUM 9.2 11/20/2022   CAION 1.14 (L) 11/12/2022   PHOS 6.0 (H) 11/20/2022  Will continue to monitor bone minerals during this admission. Continue calcium acetate with meals  4.  Hypertension with chronic kidney disease. Blood pressure at goal. Continue home regimen of amlodipine and atenolol.  BP 135/66     LOS: 11 Jermone Geister P Aleia Larocca 3/24/202410:45 AM

## 2022-11-21 NOTE — Progress Notes (Signed)
Subjective: Interval History: none..   Objective: Vital signs in last 24 hours: Temp:  [98 F (36.7 C)-98.4 F (36.9 C)] 98 F (36.7 C) (03/24 0805) Pulse Rate:  [54-63] 58 (03/24 0805) Resp:  [15-26] 18 (03/24 0805) BP: (126-166)/(66-88) 135/66 (03/24 0805) SpO2:  [95 %-100 %] 95 % (03/24 0805) Weight:  [176.2 kg-178 kg] 177.2 kg (03/24 0500)  Intake/Output from previous day: 03/23 0701 - 03/24 0700 In: -  Out: 2250 [Drains:250] Intake/Output this shift: No intake/output data recorded.  General appearance: no distress Left lower extremity with brawny edema.  Negative pressure wound therapy system is remain in place with good seal.  Lab Results: Recent Labs    11/18/22 0935 11/20/22 1416  WBC 5.1 5.0  HGB 9.5* 9.6*  HCT 30.8* 30.7*  PLT 176 179   BMET Recent Labs    11/18/22 0935 11/20/22 1416  NA 137 136  K 4.4 5.1  CL 99 99  CO2 25 26  GLUCOSE 132* 140*  BUN 63* 69*  CREATININE 12.75* 12.69*  CALCIUM 9.2 9.2    Studies/Results: DG Chest 2 View  Result Date: 11/02/2022 CLINICAL DATA:  Shortness of breath EXAM: CHEST - 2 VIEW COMPARISON:  Chest radiograph 09/27/2022 FINDINGS: Stable cardiac and mediastinal contours. No consolidative pulmonary opacities. No pleural effusion or pneumothorax. Thoracic spine degenerative changes. IMPRESSION: No active cardiopulmonary disease. Electronically Signed   By: Lovey Newcomer M.D.   On: 11/02/2022 22:58   Anti-infectives: Anti-infectives (From admission, onward)    Start     Dose/Rate Route Frequency Ordered Stop   11/17/22 2000  Ampicillin-Sulbactam (UNASYN) 3 g in sodium chloride 0.9 % 100 mL IVPB        3 g 200 mL/hr over 30 Minutes Intravenous Every 12 hours 11/17/22 1634     11/17/22 1800  ciprofloxacin (CIPRO) IVPB 400 mg        400 mg 200 mL/hr over 60 Minutes Intravenous Daily-1800 11/17/22 1634     11/17/22 1012  ceFAZolin (ANCEF) IVPB 2g/100 mL premix  Status:  Discontinued        2 g 200 mL/hr over 30  Minutes Intravenous 30 min pre-op 11/17/22 1014 11/17/22 1245   11/12/22 1400  piperacillin-tazobactam (ZOSYN) IVPB 4.5 g  Status:  Discontinued       Note to Pharmacy: First dose STAT   4.5 g 200 mL/hr over 30 Minutes Intravenous Every 8 hours 11/12/22 1212 11/12/22 1222   11/12/22 1400  piperacillin-tazobactam (ZOSYN) IVPB 3.375 g  Status:  Discontinued        3.375 g 12.5 mL/hr over 240 Minutes Intravenous Every 8 hours 11/12/22 1222 11/12/22 1236   11/12/22 1400  piperacillin-tazobactam (ZOSYN) IVPB 2.25 g  Status:  Discontinued        2.25 g 100 mL/hr over 30 Minutes Intravenous Every 8 hours 11/12/22 1236 11/17/22 1634   11/12/22 1004  ceFAZolin (ANCEF) 1-4 GM/50ML-% IVPB       Note to Pharmacy: Olena Mater F: cabinet override      11/12/22 1004 11/12/22 1308   11/12/22 0901  ceFAZolin (ANCEF) 2-4 GM/100ML-% IVPB       Note to Pharmacy: [facility] C: cabinet override      11/12/22 0901 11/12/22 1100   11/11/22 2217  ceFAZolin (ANCEF) IVPB 2g/100 mL premix        2 g 200 mL/hr over 30 Minutes Intravenous 30 min pre-op 11/11/22 2217 11/12/22 1100   11/10/22 1105  ceFAZolin (ANCEF) 2-4 GM/100ML-% IVPB  Note to Pharmacy: Sylvester Harder P: cabinet override      11/10/22 1105 11/10/22 1223   11/10/22 0600  ceFAZolin (ANCEF) IVPB 2g/100 mL premix        2 g 200 mL/hr over 30 Minutes Intravenous On call to O.R. 11/09/22 2215 11/10/22 1249       Assessment/Plan: s/p Procedure(s): DEBRIDEMENT WOUND (Left) Wound vac exchange (Left) Patient tolerated dialysis yesterday without difficulty.  Continue antibiotics and negative pressure wound therapy.  Plan for washout/debridement and VAC placement middle of the week.   LOS: 11 days   Bertram Savin 11/21/2022, 9:03 AM

## 2022-11-22 DIAGNOSIS — I10 Essential (primary) hypertension: Secondary | ICD-10-CM | POA: Diagnosis not present

## 2022-11-22 DIAGNOSIS — L97529 Non-pressure chronic ulcer of other part of left foot with unspecified severity: Secondary | ICD-10-CM | POA: Diagnosis not present

## 2022-11-22 DIAGNOSIS — L97329 Non-pressure chronic ulcer of left ankle with unspecified severity: Secondary | ICD-10-CM | POA: Diagnosis not present

## 2022-11-22 DIAGNOSIS — E11622 Type 2 diabetes mellitus with other skin ulcer: Secondary | ICD-10-CM | POA: Diagnosis not present

## 2022-11-22 DIAGNOSIS — I96 Gangrene, not elsewhere classified: Secondary | ICD-10-CM | POA: Diagnosis not present

## 2022-11-22 DIAGNOSIS — Z9889 Other specified postprocedural states: Secondary | ICD-10-CM | POA: Diagnosis not present

## 2022-11-22 DIAGNOSIS — I87332 Chronic venous hypertension (idiopathic) with ulcer and inflammation of left lower extremity: Secondary | ICD-10-CM | POA: Diagnosis not present

## 2022-11-22 DIAGNOSIS — L97909 Non-pressure chronic ulcer of unspecified part of unspecified lower leg with unspecified severity: Secondary | ICD-10-CM | POA: Diagnosis not present

## 2022-11-22 LAB — GLUCOSE, CAPILLARY
Glucose-Capillary: 113 mg/dL — ABNORMAL HIGH (ref 70–99)
Glucose-Capillary: 137 mg/dL — ABNORMAL HIGH (ref 70–99)
Glucose-Capillary: 110 mg/dL — ABNORMAL HIGH (ref 70–99)
Glucose-Capillary: 118 mg/dL — ABNORMAL HIGH (ref 70–99)

## 2022-11-22 NOTE — Progress Notes (Signed)
  Progress Note    11/22/2022 10:52 AM 5 Days Post-Op  Subjective: Alexi Herrero is a 52 year old male now status postop day 5 from his second I&D with wound VAC change of his left lower extremity.  I was called by nursing today to inspect the wound VAC system which was not working properly.  Upon inspection the canister was full and therefore clogged the VAC sponge in the patient's foot.  This was changed this afternoon and wound VAC was rehooked.  System is now working properly and draining correctly.  No other complaints today.  Vitals all remained stable.  Plan remains the same.  Please note media pictures in the file today of the foot incision with wound VAC removed.  Granulation and growth of tissue pink and without infection noted.   Vitals:   11/22/22 0431 11/22/22 0818  BP: 139/77 (!) 146/65  Pulse: 63 62  Resp: 18 18  Temp: 97.9 F (36.6 C) 98.2 F (36.8 C)  SpO2: 95% 97%   Physical Exam: Cardiac:  RRR S1&S2 Normal Lungs: Clear on auscultation throughout, no rhonchi rales or wheezing. Incisions: Left lower extremity with left medial calf incision with wound VAC and top of left foot with wound VAC Extremities: Bilateral lower extremities with +4 edema.  Left lower extremity with wound VAC x 2.  Pulses dopplerable only. Abdomen: Positive bowel sounds all 4 quadrants, soft, nontender, nondistended. Neurologic: Patient is alert and awake x 4, follows all commands appropriately, answers all questions appropriately.  CBC    Component Value Date/Time   WBC 5.0 11/20/2022 1416   RBC 3.07 (L) 11/20/2022 1416   HGB 9.6 (L) 11/20/2022 1416   HCT 30.7 (L) 11/20/2022 1416   PLT 179 11/20/2022 1416   MCV 100.0 11/20/2022 1416   MCH 31.3 11/20/2022 1416   MCHC 31.3 11/20/2022 1416   RDW 15.0 11/20/2022 1416   LYMPHSABS 1.3 11/10/2022 1122   MONOABS 0.6 11/10/2022 1122   EOSABS 0.2 11/10/2022 1122   BASOSABS 0.1 11/10/2022 1122    BMET    Component Value Date/Time   NA 136  11/20/2022 1416   K 5.1 11/20/2022 1416   CL 99 11/20/2022 1416   CO2 26 11/20/2022 1416   GLUCOSE 140 (H) 11/20/2022 1416   BUN 69 (H) 11/20/2022 1416   CREATININE 12.69 (H) 11/20/2022 1416   CALCIUM 9.2 11/20/2022 1416   GFRNONAA 4 (L) 11/20/2022 1416   GFRAA 9 (L) 02/19/2020 1316    INR    Component Value Date/Time   INR 1.1 11/12/2022 2040     Intake/Output Summary (Last 24 hours) at 11/22/2022 1052 Last data filed at 11/22/2022 0400 Gross per 24 hour  Intake 1413.4 ml  Output 25 ml  Net 1388.4 ml     Assessment/Plan:  52 y.o. male is s/p incision and drainage of left lower extremity x 2 wounds.  The left calf and left foot.  5 Days Post-Op   Plan:  Patient is scheduled to return to the operating room on Wednesday, 11/24/2022 for I&D of left lower extremity wounds with wound VAC exchange.  Plans for discharge home with home health wound VAC changes and patient is return to wound clinic will be determined after Wednesday.  DVT prophylaxis: Eliquis 5 mg twice daily, Plavix 75 mg daily   Drema Pry [facility] 11/22/2022 10:52 AM

## 2022-11-22 NOTE — Progress Notes (Signed)
Pt eating fast food from his mom.

## 2022-11-22 NOTE — H&P (View-Only) (Signed)
  Progress Note    11/22/2022 10:52 AM 5 Days Post-Op  Subjective: [name] is a 52 year old male now status postop day 5 from his second I&D with wound VAC change of his left lower extremity.  I was called by nursing today to inspect the wound VAC system which was not working properly.  Upon inspection the canister was full and therefore clogged the VAC sponge in the patient's foot.  This was changed this afternoon and wound VAC was rehooked.  System is now working properly and draining correctly.  No other complaints today.  Vitals all remained stable.  Plan remains the same.  Please note media pictures in the file today of the foot incision with wound VAC removed.  Granulation and growth of tissue pink and without infection noted.   Vitals:   11/22/22 0431 11/22/22 0818  BP: 139/77 (!) 146/65  Pulse: 63 62  Resp: 18 18  Temp: 97.9 F (36.6 C) 98.2 F (36.8 C)  SpO2: 95% 97%   Physical Exam: Cardiac:  RRR S1&S2 Normal Lungs: Clear on auscultation throughout, no rhonchi rales or wheezing. Incisions: Left lower extremity with left medial calf incision with wound VAC and top of left foot with wound VAC Extremities: Bilateral lower extremities with +4 edema.  Left lower extremity with wound VAC x 2.  Pulses dopplerable only. Abdomen: Positive bowel sounds all 4 quadrants, soft, nontender, nondistended. Neurologic: Patient is alert and awake x 4, follows all commands appropriately, answers all questions appropriately.  CBC    Component Value Date/Time   WBC 5.0 11/20/2022 1416   RBC 3.07 (L) 11/20/2022 1416   HGB 9.6 (L) 11/20/2022 1416   HCT 30.7 (L) 11/20/2022 1416   PLT 179 11/20/2022 1416   MCV 100.0 11/20/2022 1416   MCH 31.3 11/20/2022 1416   MCHC 31.3 11/20/2022 1416   RDW 15.0 11/20/2022 1416   LYMPHSABS 1.3 11/10/2022 1122   MONOABS 0.6 11/10/2022 1122   EOSABS 0.2 11/10/2022 1122   BASOSABS 0.1 11/10/2022 1122    BMET    Component Value Date/Time   NA 136  11/20/2022 1416   K 5.1 11/20/2022 1416   CL 99 11/20/2022 1416   CO2 26 11/20/2022 1416   GLUCOSE 140 (H) 11/20/2022 1416   BUN 69 (H) 11/20/2022 1416   CREATININE 12.69 (H) 11/20/2022 1416   CALCIUM 9.2 11/20/2022 1416   GFRNONAA 4 (L) 11/20/2022 1416   GFRAA 9 (L) 02/19/2020 1316    INR    Component Value Date/Time   INR 1.1 11/12/2022 2040     Intake/Output Summary (Last 24 hours) at 11/22/2022 1052 Last data filed at 11/22/2022 0400 Gross per 24 hour  Intake 1413.4 ml  Output 25 ml  Net 1388.4 ml     Assessment/Plan:  52 y.o. male is s/p incision and drainage of left lower extremity x 2 wounds.  The left calf and left foot.  5 Days Post-Op   Plan:  Patient is scheduled to return to the operating room on Wednesday, 11/24/2022 for I&D of left lower extremity wounds with wound VAC exchange.  Plans for discharge home with home health wound VAC changes and patient is return to wound clinic will be determined after Wednesday.  DVT prophylaxis: Eliquis 5 mg twice daily, Plavix 75 mg daily   Drema Pry [facility] 11/22/2022 10:52 AM

## 2022-11-22 NOTE — Progress Notes (Signed)
[facility]  ROUNDING NOTE   Subjective:   [name] is a 52 year old male with past medical conditions including anemia, hypertension, diabetes, PVD, sleep apnea, vitamin D deficiency, and end-stage renal disease on hemodialysis.  Patient presents to the hospital for scheduled debridement of left lower venous ulcers.  Patient currently admitted for Gangrene of lower extremity ([place]) [I96]  Patient seen laying in bed No family at bedside Alert and oriented Pain well-controlled Remains on room air with adequate appetite   Objective:  Vital signs in last 24 hours:  Temp:  [97.9 F (36.6 C)-98.2 F (36.8 C)] 98.2 F (36.8 C) (03/25 0818) Pulse Rate:  [61-63] 62 (03/25 0818) Resp:  [18] 18 (03/25 0818) BP: (139-147)/(65-77) 146/65 (03/25 0818) SpO2:  [95 %-98 %] 97 % (03/25 0818) Weight:  [181.3 kg] 181.3 kg (03/25 0445)  Weight change: 3.3 kg Filed Weights   11/20/22 1809 11/21/22 0500 11/22/22 0445  Weight: (!) 176.2 kg (!) 177.2 kg (!) 181.3 kg    Intake/Output: I/O last 3 completed shifts: In: 1413.4 [I.V.:18.6; IV Piggyback:1394.8] Out: 175 [Drains:175]   Intake/Output this shift:  No intake/output data recorded.  Physical Exam: General: NAD, laying in bed  Head: Normocephalic, atraumatic. Moist oral mucosal membranes  Eyes: Anicteric  Lungs:  Clear to auscultation, normal effort, room air  Heart: Regular rate and rhythm  Abdomen:  Soft, nontender, obese  Extremities: trace RLE peripheral edema  Neurologic: Nonfocal, moving all four extremities  Skin: No lesions, LLE wounds with wound VAC. +edema   Access: Lt AVG    Basic Metabolic Panel: Recent Labs  Lab 11/16/22 0810 11/17/22 0603 11/18/22 0935 11/20/22 1416  NA 138 136 137 136  K 4.8 4.4 4.4 5.1  CL 99 101 99 99  CO2 26 28 25 26   GLUCOSE 99 151* 132* 140*  BUN 68* 50* 63* 69*  CREATININE 13.10* 10.26* 12.75* 12.69*  CALCIUM 9.3 8.9 9.2 9.2  PHOS 7.2*  --  6.9* 6.0*     Liver  Function Tests: Recent Labs  Lab 11/16/22 0810 11/17/22 0603 11/18/22 0935 11/20/22 1416  AST  --  17  --   --   ALT  --  <5  --   --   ALKPHOS  --  44  --   --   BILITOT  --  0.9  --   --   PROT  --  6.9  --   --   ALBUMIN 3.1* 3.1* 3.1* 3.1*    No results for input(s): "LIPASE", "AMYLASE" in the last 168 hours. No results for input(s): "AMMONIA" in the last 168 hours.  CBC: Recent Labs  Lab 11/16/22 0810 11/17/22 0603 11/18/22 0935 11/20/22 1416  WBC 5.8 4.7 5.1 5.0  HGB 9.5* 9.2* 9.5* 9.6*  HCT 30.7* 29.4* 30.8* 30.7*  MCV 99.7 99.0 102.0* 100.0  PLT 170 172 176 179     Cardiac Enzymes: No results for input(s): "CKTOTAL", "CKMB", "CKMBINDEX", "TROPONINI" in the last 168 hours.  BNP: Invalid input(s): "POCBNP"  CBG: Recent Labs  Lab 11/21/22 0807 11/21/22 1149 11/21/22 1656 11/21/22 2105 11/22/22 0819  GLUCAP 109* 116* 127* 122* 113*     Microbiology: Results for orders placed or performed during the hospital encounter of 11/10/22  Aerobic/Anaerobic Culture w Gram Stain (surgical/deep wound)     Status: None   Collection Time: 11/10/22 12:45 PM   Specimen: Leg, Left; Wound  Result Value Ref Range Status   Specimen Description   Final  LEG LEFT CALF WOUND Performed at [facility], [address] [place], [place] [address]    Special Requests   Final    NONE Performed at [facility], [place], [place] [address]    Gram Stain NO WBC SEEN RARE GRAM NEGATIVE RODS   Final   Culture   Final    FEW ESCHERICHIA COLI RARE ENTEROCOCCUS FAECALIS NO ANAEROBES ISOLATED Performed at [facility], [place] [address] [place], [place] [address]    Report Status 11/15/2022 FINAL  Final   Organism ID, Bacteria ESCHERICHIA COLI  Final   Organism ID, Bacteria ENTEROCOCCUS FAECALIS  Final      Susceptibility   Escherichia coli - MIC*    AMPICILLIN 4 SENSITIVE Sensitive     CEFEPIME <=0.12 SENSITIVE Sensitive      CEFTAZIDIME <=1 SENSITIVE Sensitive     CEFTRIAXONE <=0.25 SENSITIVE Sensitive     CIPROFLOXACIN <=0.25 SENSITIVE Sensitive     GENTAMICIN <=1 SENSITIVE Sensitive     IMIPENEM <=0.25 SENSITIVE Sensitive     TRIMETH/SULFA >=320 RESISTANT Resistant     AMPICILLIN/SULBACTAM <=2 SENSITIVE Sensitive     PIP/TAZO <=4 SENSITIVE Sensitive     * FEW ESCHERICHIA COLI   Enterococcus faecalis - MIC*    AMPICILLIN <=2 SENSITIVE Sensitive     VANCOMYCIN 1 SENSITIVE Sensitive     GENTAMICIN SYNERGY SENSITIVE Sensitive     LINEZOLID 2 SENSITIVE Sensitive     * RARE ENTEROCOCCUS FAECALIS  Aerobic/Anaerobic Culture w Gram Stain (surgical/deep wound)     Status: None   Collection Time: 11/10/22  1:00 PM   Specimen: Foot, Left; Wound  Result Value Ref Range Status   Specimen Description   Final    FOOT LEFT FOOT Performed at [facility], [address], [place], [place] [address]    Special Requests   Final    NONE Performed at [facility], [place], [place] [address]    Gram Stain NO WBC SEEN NO ORGANISMS SEEN   Final   Culture   Final    RARE ESCHERICHIA COLI RARE SERRATIA MARCESCENS RARE ENTEROCOCCUS FAECALIS NO ANAEROBES ISOLATED Performed at [facility], [address], [place], [place] [address]    Report Status 11/15/2022 FINAL  Final   Organism ID, Bacteria ESCHERICHIA COLI  Final   Organism ID, Bacteria SERRATIA MARCESCENS  Final   Organism ID, Bacteria ENTEROCOCCUS FAECALIS  Final      Susceptibility   Escherichia coli - MIC*    AMPICILLIN <=2 SENSITIVE Sensitive     CEFEPIME <=0.12 SENSITIVE Sensitive     CEFTAZIDIME <=1 SENSITIVE Sensitive     CEFTRIAXONE <=0.25 SENSITIVE Sensitive     CIPROFLOXACIN <=0.25 SENSITIVE Sensitive     GENTAMICIN <=1 SENSITIVE Sensitive     IMIPENEM <=0.25 SENSITIVE Sensitive     TRIMETH/SULFA <=20 SENSITIVE Sensitive     AMPICILLIN/SULBACTAM <=2 SENSITIVE Sensitive     PIP/TAZO <=4 SENSITIVE Sensitive      * RARE ESCHERICHIA COLI   Enterococcus faecalis - MIC*    AMPICILLIN <=2 SENSITIVE Sensitive     VANCOMYCIN 1 SENSITIVE Sensitive     GENTAMICIN SYNERGY SENSITIVE Sensitive     LINEZOLID 2 SENSITIVE Sensitive     * RARE ENTEROCOCCUS FAECALIS   Serratia marcescens - MIC*    CEFEPIME <=0.12 SENSITIVE Sensitive     CEFTAZIDIME <=1 SENSITIVE Sensitive     CEFTRIAXONE <=0.25 SENSITIVE Sensitive  CIPROFLOXACIN <=0.25 SENSITIVE Sensitive     GENTAMICIN <=1 SENSITIVE Sensitive     TRIMETH/SULFA <=20 SENSITIVE Sensitive     * RARE SERRATIA MARCESCENS    Coagulation Studies: No results for input(s): "LABPROT", "INR" in the last 72 hours.   Urinalysis: No results for input(s): "COLORURINE", "LABSPEC", "PHURINE", "GLUCOSEU", "HGBUR", "BILIRUBINUR", "KETONESUR", "PROTEINUR", "UROBILINOGEN", "NITRITE", "LEUKOCYTESUR" in the last 72 hours.  Invalid input(s): "APPERANCEUR"    Imaging: No results found.   Medications:    sodium chloride 10 mL/hr at 11/21/22 2333   ampicillin-sulbactam (UNASYN) IV Stopped (11/21/22 2141)   ciprofloxacin Stopped (11/21/22 1851)    allopurinol  100 mg Oral Daily   amLODipine  10 mg Oral Once per day on Sun Mon Wed Fri   apixaban  5 mg Oral BID   vitamin C  500 mg Oral BID   atenolol  50 mg Oral Once per day on Sun Mon Wed Fri   atorvastatin  10 mg Oral Daily   calcium acetate  2,001 mg Oral TID WC   Chlorhexidine Gluconate Cloth  6 each Topical Q0600   cinacalcet  30 mg Oral Daily   clopidogrel  75 mg Oral Daily   docusate sodium  200 mg Oral BID   feeding supplement (NEPRO CARB STEADY)  237 mL Oral TID BM   guaiFENesin  600 mg Oral BID   insulin aspart  0-6 Units Subcutaneous TID WC   insulin glargine-yfgn  10 Units Subcutaneous QHS   multivitamin  1 tablet Oral QHS   polyethylene glycol  17 g Oral Daily   traZODone  50 mg Oral QHS   zinc sulfate  220 mg Oral Daily   sodium chloride, albuterol, benzonatate, bisacodyl, calcium acetate,  diphenhydrAMINE, lidocaine-prilocaine, loratadine, melatonin, ondansetron (ZOFRAN) IV, mouth rinse, oxyCODONE-acetaminophen  Assessment/ Plan:  [name] is a 52 y.o.  male with past medical conditions including anemia, hypertension, diabetes, PVD, sleep apnea, vitamin D deficiency, and end-stage renal disease on hemodialysis.  Patient presents to the hospital for scheduled debridement of left lower venous ulcers.  Patient currently admitted for Gangrene of lower extremity ([place]) [I96]    End stage renal disease on hemodialysis. Continue TTS schedule  Next dialysis treatment scheduled for Tuesday.   2. Anemia of chronic kidney disease.  Lab Results  Component Value Date   HGB 9.6 (L) 11/20/2022    Patient receives Mircera at outpatient clinic.    3. Secondary Hyperparathyroidism: with outpatient labs: PTH 270 (3/12), phosphorus 6.0, calcium 9.2 .  Lab Results  Component Value Date   CALCIUM 9.2 11/20/2022   CAION 1.14 (L) 11/12/2022   PHOS 6.0 (H) 11/20/2022  Calcium acceptable however phosphorus slightly elevated. Continue calcium acetate with meals  4.  Hypertension with chronic kidney disease. Blood pressure at goal. Continue home regimen of amlodipine and atenolol.  Blood pressure remains acceptable for this patient.     LOS: 12 [name] 3/25/202411:06 AM

## 2022-11-22 NOTE — Progress Notes (Signed)
[facility] [place] NAME: [name]    MR#:  [id number]  DATE OF BIRTH:  April 04, 1971  SUBJECTIVE:  No family today  at bedside S/p application of wound vac in OR 11/17/22 Pt tolerating BP meds   VITALS:  Blood pressure (!) 146/65, pulse 62, temperature 98.2 F (36.8 C), resp. rate 18, height 6\' 4"  (1.93 m), weight (!) 181.3 kg, SpO2 97 %.  PHYSICAL EXAMINATION:   GENERAL:  52 y.o.-year-old patient with no acute distress. obese LUNGS: Normal breath sounds bilaterally, no wheezing CARDIOVASCULAR: S1, S2 normal.  ABDOMEN: Soft, nontender, nondistended. Bowel sounds present.  EXTREMITIES: wound vac x 2 places on left LE NEUROLOGIC: nonfocal  patient is alert and awake SKIN: as above  LABORATORY PANEL:  CBC Recent Labs  Lab 11/20/22 1416  WBC 5.0  HGB 9.6*  HCT 30.7*  PLT 179     Chemistries  Recent Labs  Lab 11/17/22 0603 11/18/22 0935 11/20/22 1416  NA 136   < > 136  K 4.4   < > 5.1  CL 101   < > 99  CO2 28   < > 26  GLUCOSE 151*   < > 140*  BUN 50*   < > 69*  CREATININE 10.26*   < > 12.69*  CALCIUM 8.9   < > 9.2  AST 17  --   --   ALT <5  --   --   ALKPHOS 44  --   --   BILITOT 0.9  --   --    < > = values in this interval not displayed.     Assessment and Plan   Gangrene of lower extremity ([place]) --Secondary to PAD and uncontrolled type 2 diabetes. --Status post excisional debridement of 2 left lower extremity venous ulcers with placement of wound VAC dressing to both venous ulcers 03/13 --Patient returned to the OR for further debridement of necrotic tissue 03/15 --Wound cultures-yielded E. coli, Enterococcus faecalis and Serratia Marcescens --Continue IV Zosyn -Optimize glycemic control --Further management per vascular surgery--need to stay till next wed for OR wound vac change per Dr Delana Meyer   Hypertension --pt now on amlodipine and atenolol on NON dialysis days (after d/w pt--dr Kolluru  aware) --bp much better   Diabetes mellitus with hyperglycemia ([place]) --A1c last checked in January 2024, elevated at 8.6 %.   --Patient has recently established with endocrinology.  -- He is currently on Lantus 10 units at bedtime.   --Blood sugars are stable   End stage renal disease [facility]) - Nephrology consulted --Renal replacement therapy T/TH/S   Reactive airway disease --Patient states that since having COVID approximately 6 weeks ago, he has been experiencing a persistent nonproductive cough and wheezing.    --Continue albuterol as needed --Tessalon Perles and guaifenesin for cough   Hyperprolactinemia ([place]) - Per patient he was told by his endocrinologist to discontinue bromocriptine since his prolactin levels have normalized. -Will hold bromocriptine for now -Patient to follow-up with endocrinology as an out patient   Morbid Obesity (BMI 46.99) Lifestyle modification and exercise has been discussed with patient in detail Complicates overall prognosis and care.     IM will sign off. Call if needed. Cont present meds at discharge  Procedures: wound vac placement CODE STATUS: full DVT Prophylaxis :eliquis Level of care: Med-Surg    TOTAL TIME TAKING CARE OF THIS PATIENT: 30 minutes.  >50% time spent on counselling and coordination of care  Note: This dictation was prepared with Dragon dictation along with smaller phrase technology. Any transcriptional errors that result from this process are unintentional.  Fritzi Mandes M.D    [facility]   CC: Primary care physician; [name], MD

## 2022-11-23 LAB — GLUCOSE, CAPILLARY
Glucose-Capillary: 100 mg/dL — ABNORMAL HIGH (ref 70–99)
Glucose-Capillary: 104 mg/dL — ABNORMAL HIGH (ref 70–99)
Glucose-Capillary: 127 mg/dL — ABNORMAL HIGH (ref 70–99)

## 2022-11-23 LAB — RENAL FUNCTION PANEL
Albumin: 3.1 g/dL — ABNORMAL LOW (ref 3.5–5.0)
Anion gap: 10 (ref 5–15)
BUN: 75 mg/dL — ABNORMAL HIGH (ref 6–20)
CO2: 25 mmol/L (ref 22–32)
Calcium: 8.8 mg/dL — ABNORMAL LOW (ref 8.9–10.3)
Chloride: 100 mmol/L (ref 98–111)
Creatinine, Ser: 14.88 mg/dL — ABNORMAL HIGH (ref 0.61–1.24)
GFR, Estimated: 4 mL/min — ABNORMAL LOW (ref >60)
Glucose, Bld: 104 mg/dL — ABNORMAL HIGH (ref 70–99)
Phosphorus: 6.2 mg/dL — ABNORMAL HIGH (ref 2.5–4.6)
Potassium: 4.5 mmol/L (ref 3.5–5.1)
Sodium: 135 mmol/L (ref 135–145)

## 2022-11-23 LAB — CBC
HCT: 29 % — ABNORMAL LOW (ref 39.0–52.0)
Hemoglobin: 9.2 g/dL — ABNORMAL LOW (ref 13.0–17.0)
MCH: 31.3 pg (ref 26.0–34.0)
MCHC: 31.7 g/dL (ref 30.0–36.0)
MCV: 98.6 fL (ref 80.0–100.0)
Platelets: 161 10*3/uL (ref 150–400)
RBC: 2.94 MIL/uL — ABNORMAL LOW (ref 4.22–5.81)
RDW: 15.2 % (ref 11.5–15.5)
WBC: 5.7 10*3/uL (ref 4.0–10.5)
nRBC: 0 % (ref 0.0–0.2)

## 2022-11-23 MED ORDER — DIPHENHYDRAMINE HCL 50 MG/ML IJ SOLN: 50.0000 mg | Freq: Once | INTRAMUSCULAR | Status: DC | PRN

## 2022-11-23 MED ORDER — CHLORHEXIDINE GLUCONATE 4 % EX LIQD
60.0000 mL | Freq: Once | CUTANEOUS | Status: AC
  Administered 2022-11-24: 4 via TOPICAL

## 2022-11-23 MED ORDER — FAMOTIDINE 20 MG PO TABS: 40.0000 mg | ORAL_TABLET | Freq: Once | ORAL | Status: DC | PRN

## 2022-11-23 MED ORDER — CHLORHEXIDINE GLUCONATE 4 % EX LIQD
60.0000 mL | Freq: Once | CUTANEOUS | Status: AC
  Administered 2022-11-23: 4 via TOPICAL

## 2022-11-23 MED ORDER — METHYLPREDNISOLONE SODIUM SUCC 125 MG IJ SOLR: 125.0000 mg | Freq: Once | INTRAMUSCULAR | Status: DC | PRN

## 2022-11-23 MED ORDER — MIDAZOLAM HCL 2 MG/ML PO SYRP: 8.0000 mg | ORAL_SOLUTION | Freq: Once | ORAL | Status: DC | PRN

## 2022-11-23 MED ORDER — SODIUM CHLORIDE 0.9 % IV SOLN: INTRAVENOUS | Status: DC

## 2022-11-23 NOTE — TOC Progression Note (Signed)
Transition of Care [facility]) - Progression Note    Patient Details  Name: [name] MRN: [id number] Date of Birth: [birth year]  Transition of Care [facility]) CM/SW Contact  Beverly Sessions, RN Phone Number: 11/23/2022, 3:17 PM  Clinical Narrative:     Per vascular Patient is scheduled to return to the operating room on Wednesday, 11/24/2022 for I&D of left lower extremity wounds with wound VAC exchange. Plans for discharge home with home health wound VAC changes and patient is return to wound clinic will be determined after Wednesday.        Expected Discharge Plan and Services                                               Social Determinants of Health (SDOH) Interventions SDOH Screenings   Food Insecurity: No Food Insecurity (11/10/2022)  Housing: Low Risk  (11/10/2022)  Transportation Needs: No Transportation Needs (11/10/2022)  Utilities: Not At Risk (11/10/2022)  Tobacco Use: Medium Risk (11/18/2022)    Readmission Risk Interventions     No data to display

## 2022-11-23 NOTE — Progress Notes (Signed)
[facility]  ROUNDING NOTE   Subjective:   [name] is a 52 year old male with past medical conditions including anemia, hypertension, diabetes, PVD, sleep apnea, vitamin D deficiency, and end-stage renal disease on hemodialysis.  Patient presents to the hospital for scheduled debridement of left lower venous ulcers.  Patient currently admitted for Gangrene of lower extremity ([place]) [I96]  Patient seen and evaluated during dialysis   HEMODIALYSIS FLOWSHEET:  Blood Flow Rate (mL/min): 350 mL/min Arterial Pressure (mmHg): -140 mmHg Venous Pressure (mmHg): 170 mmHg TMP (mmHg): 16 mmHg Ultrafiltration Rate (mL/min): 768 mL/min Dialysate Flow Rate (mL/min): 300 ml/min  No complaints at this time Pain well managed  Objective:  Vital signs in last 24 hours:  Temp:  [97.7 F (36.5 C)-97.9 F (36.6 C)] 97.8 F (36.6 C) (03/26 0815) Pulse Rate:  [53-69] 59 (03/26 1130) Resp:  [14-25] 20 (03/26 1130) BP: (105-135)/(40-69) 125/45 (03/26 1130) SpO2:  [94 %-98 %] 98 % (03/26 1130) Weight:  [177.1 kg] 177.1 kg (03/26 0847)  Weight change:  Filed Weights   11/21/22 0500 11/22/22 0445 11/23/22 0847  Weight: (!) 177.2 kg (!) 181.3 kg (!) 177.1 kg    Intake/Output: I/O last 3 completed shifts: In: 1653.4 [P.O.:240; I.V.:18.6; IV Piggyback:1394.8] Out: 85 [Drains:85]   Intake/Output this shift:  No intake/output data recorded.  Physical Exam: General: NAD, laying in bed  Head: Normocephalic, atraumatic. Moist oral mucosal membranes  Eyes: Anicteric  Lungs:  Clear to auscultation, normal effort, room air  Heart: Regular rate and rhythm  Abdomen:  Soft, nontender, obese  Extremities: trace RLE peripheral edema  Neurologic: Nonfocal, moving all four extremities  Skin: No lesions, LLE wounds with wound VAC. +edema   Access: Lt AVG    Basic Metabolic Panel: Recent Labs  Lab 11/17/22 0603 11/18/22 0935 11/20/22 1416 11/23/22 0838  NA 136 137 136 135  K 4.4  4.4 5.1 4.5  CL 101 99 99 100  CO2 28 25 26 25   GLUCOSE 151* 132* 140* 104*  BUN 50* 63* 69* 75*  CREATININE 10.26* 12.75* 12.69* 14.88*  CALCIUM 8.9 9.2 9.2 8.8*  PHOS  --  6.9* 6.0* 6.2*     Liver Function Tests: Recent Labs  Lab 11/17/22 0603 11/18/22 0935 11/20/22 1416 11/23/22 0838  AST 17  --   --   --   ALT <5  --   --   --   ALKPHOS 44  --   --   --   BILITOT 0.9  --   --   --   PROT 6.9  --   --   --   ALBUMIN 3.1* 3.1* 3.1* 3.1*    No results for input(s): "LIPASE", "AMYLASE" in the last 168 hours. No results for input(s): "AMMONIA" in the last 168 hours.  CBC: Recent Labs  Lab 11/17/22 0603 11/18/22 0935 11/20/22 1416 11/23/22 0838  WBC 4.7 5.1 5.0 5.7  HGB 9.2* 9.5* 9.6* 9.2*  HCT 29.4* 30.8* 30.7* 29.0*  MCV 99.0 102.0* 100.0 98.6  PLT 172 176 179 161     Cardiac Enzymes: No results for input(s): "CKTOTAL", "CKMB", "CKMBINDEX", "TROPONINI" in the last 168 hours.  BNP: Invalid input(s): "POCBNP"  CBG: Recent Labs  Lab 11/22/22 0819 11/22/22 1143 11/22/22 1657 11/22/22 2057 11/23/22 0716  GLUCAP 113* 137* 110* 118* 100*     Microbiology: Results for orders placed or performed during the hospital encounter of 11/10/22  Aerobic/Anaerobic Culture w Gram Stain (surgical/deep wound)  Status: None   Collection Time: 11/10/22 12:45 PM   Specimen: Leg, Left; Wound  Result Value Ref Range Status   Specimen Description   Final    LEG LEFT CALF WOUND Performed at [facility], [address] [place], [place] [address]    Special Requests   Final    NONE Performed at [facility], [address] [place], [place] [address]    Gram Stain NO WBC SEEN RARE GRAM NEGATIVE RODS   Final   Culture   Final    FEW ESCHERICHIA COLI RARE ENTEROCOCCUS FAECALIS NO ANAEROBES ISOLATED Performed at [facility], [address], [place], [place] [address]    Report Status 11/15/2022 FINAL  Final   Organism ID, Bacteria  ESCHERICHIA COLI  Final   Organism ID, Bacteria ENTEROCOCCUS FAECALIS  Final      Susceptibility   Escherichia coli - MIC*    AMPICILLIN 4 SENSITIVE Sensitive     CEFEPIME <=0.12 SENSITIVE Sensitive     CEFTAZIDIME <=1 SENSITIVE Sensitive     CEFTRIAXONE <=0.25 SENSITIVE Sensitive     CIPROFLOXACIN <=0.25 SENSITIVE Sensitive     GENTAMICIN <=1 SENSITIVE Sensitive     IMIPENEM <=0.25 SENSITIVE Sensitive     TRIMETH/SULFA >=320 RESISTANT Resistant     AMPICILLIN/SULBACTAM <=2 SENSITIVE Sensitive     PIP/TAZO <=4 SENSITIVE Sensitive     * FEW ESCHERICHIA COLI   Enterococcus faecalis - MIC*    AMPICILLIN <=2 SENSITIVE Sensitive     VANCOMYCIN 1 SENSITIVE Sensitive     GENTAMICIN SYNERGY SENSITIVE Sensitive     LINEZOLID 2 SENSITIVE Sensitive     * RARE ENTEROCOCCUS FAECALIS  Aerobic/Anaerobic Culture w Gram Stain (surgical/deep wound)     Status: None   Collection Time: 11/10/22  1:00 PM   Specimen: Foot, Left; Wound  Result Value Ref Range Status   Specimen Description   Final    FOOT LEFT FOOT Performed at [facility], [address], [place], [place] [address]    Special Requests   Final    NONE Performed at [facility], [place], [place] [address]    Gram Stain NO WBC SEEN NO ORGANISMS SEEN   Final   Culture   Final    RARE ESCHERICHIA COLI RARE SERRATIA MARCESCENS RARE ENTEROCOCCUS FAECALIS NO ANAEROBES ISOLATED Performed at [facility], [address], [place], [place] [address]    Report Status 11/15/2022 FINAL  Final   Organism ID, Bacteria ESCHERICHIA COLI  Final   Organism ID, Bacteria SERRATIA MARCESCENS  Final   Organism ID, Bacteria ENTEROCOCCUS FAECALIS  Final      Susceptibility   Escherichia coli - MIC*    AMPICILLIN <=2 SENSITIVE Sensitive     CEFEPIME <=0.12 SENSITIVE Sensitive     CEFTAZIDIME <=1 SENSITIVE Sensitive     CEFTRIAXONE <=0.25 SENSITIVE Sensitive     CIPROFLOXACIN <=0.25 SENSITIVE Sensitive      GENTAMICIN <=1 SENSITIVE Sensitive     IMIPENEM <=0.25 SENSITIVE Sensitive     TRIMETH/SULFA <=20 SENSITIVE Sensitive     AMPICILLIN/SULBACTAM <=2 SENSITIVE Sensitive     PIP/TAZO <=4 SENSITIVE Sensitive     * RARE ESCHERICHIA COLI   Enterococcus faecalis - MIC*    AMPICILLIN <=2 SENSITIVE Sensitive     VANCOMYCIN 1 SENSITIVE Sensitive     GENTAMICIN SYNERGY SENSITIVE Sensitive     LINEZOLID 2 SENSITIVE Sensitive     * RARE ENTEROCOCCUS  FAECALIS   Serratia marcescens - MIC*    CEFEPIME <=0.12 SENSITIVE Sensitive     CEFTAZIDIME <=1 SENSITIVE Sensitive     CEFTRIAXONE <=0.25 SENSITIVE Sensitive     CIPROFLOXACIN <=0.25 SENSITIVE Sensitive     GENTAMICIN <=1 SENSITIVE Sensitive     TRIMETH/SULFA <=20 SENSITIVE Sensitive     * RARE SERRATIA MARCESCENS    Coagulation Studies: No results for input(s): "LABPROT", "INR" in the last 72 hours.   Urinalysis: No results for input(s): "COLORURINE", "LABSPEC", "PHURINE", "GLUCOSEU", "HGBUR", "BILIRUBINUR", "KETONESUR", "PROTEINUR", "UROBILINOGEN", "NITRITE", "LEUKOCYTESUR" in the last 72 hours.  Invalid input(s): "APPERANCEUR"    Imaging: No results found.   Medications:    sodium chloride 10 mL/hr at 11/21/22 2333   ampicillin-sulbactam (UNASYN) IV 3 g (11/22/22 1959)   ciprofloxacin 400 mg (11/22/22 1849)    allopurinol  100 mg Oral Daily   amLODipine  10 mg Oral Once per day on Sun Mon Wed Fri   apixaban  5 mg Oral BID   vitamin C  500 mg Oral BID   atenolol  50 mg Oral Once per day on Sun Mon Wed Fri   atorvastatin  10 mg Oral Daily   calcium acetate  2,001 mg Oral TID WC   Chlorhexidine Gluconate Cloth  6 each Topical Q0600   cinacalcet  30 mg Oral Daily   clopidogrel  75 mg Oral Daily   docusate sodium  200 mg Oral BID   feeding supplement (NEPRO CARB STEADY)  237 mL Oral TID BM   insulin aspart  0-6 Units Subcutaneous TID WC   insulin glargine-yfgn  10 Units Subcutaneous QHS   multivitamin  1 tablet Oral QHS    polyethylene glycol  17 g Oral Daily   traZODone  50 mg Oral QHS   zinc sulfate  220 mg Oral Daily   sodium chloride, albuterol, benzonatate, bisacodyl, calcium acetate, diphenhydrAMINE, lidocaine-prilocaine, loratadine, melatonin, ondansetron (ZOFRAN) IV, mouth rinse, oxyCODONE-acetaminophen  Assessment/ Plan:  [name] is a 52 y.o.  male with past medical conditions including anemia, hypertension, diabetes, PVD, sleep apnea, vitamin D deficiency, and end-stage renal disease on hemodialysis.  Patient presents to the hospital for scheduled debridement of left lower venous ulcers.  Patient currently admitted for Gangrene of lower extremity ([place]) [I96]    End stage renal disease on hemodialysis. Continue TTS schedule  Receiving dialysis today, UF goal 2 L as tolerated. Next treatment scheduled for Thursday.   2. Anemia of chronic kidney disease.  Lab Results  Component Value Date   HGB 9.2 (L) 11/23/2022    Patient receives Mircera at outpatient clinic.  Hemoglobin remains within desired target.  3. Secondary Hyperparathyroidism: with outpatient labs: PTH 270 (3/12), phosphorus 6.0, calcium 9.2 .  Lab Results  Component Value Date   CALCIUM 8.8 (L) 11/23/2022   CAION 1.14 (L) 11/12/2022   PHOS 6.2 (H) 11/23/2022  Will continue to monitor bone minerals during this admission. Continue calcium acetate with meals  4.  Hypertension with chronic kidney disease. Blood pressure at goal. Continue home regimen of amlodipine and atenolol.  Blood pressure remains 129/46 during dialysis.     LOS: [place] 3/26/202412:00 PM

## 2022-11-23 NOTE — Progress Notes (Addendum)
Received patient in bed to unit.    Informed consent signed and in chart.    TX duration:3.5     Transported back to room via hospital transporter Hand-off given to patient's nurse.    Access used: L AVF Access issues: None   Total UF removed: 2000 Medication(s) given:none Post HD VS: Stable Post HD weight: 175kg    Ilda Mori RN Kidney Dialysis Unit

## 2022-11-24 ENCOUNTER — Inpatient Hospital Stay: Payer: Medicare HMO | Admitting: Anesthesiology

## 2022-11-24 ENCOUNTER — Other Ambulatory Visit: Payer: Self-pay

## 2022-11-24 ENCOUNTER — Encounter
Admission: RE | Disposition: A | Discharge status: Home Health | Payer: Self-pay | Source: Home / Self Care | Attending: Vascular Surgery

## 2022-11-24 ENCOUNTER — Encounter: Payer: Self-pay | Admitting: Vascular Surgery

## 2022-11-24 HISTORY — PX: APPLICATION OF WOUND VAC: SHX5189

## 2022-11-24 HISTORY — PX: INCISION AND DRAINAGE OF WOUND: SHX1803

## 2022-11-24 LAB — GLUCOSE, CAPILLARY
Glucose-Capillary: 156 mg/dL — ABNORMAL HIGH (ref 70–99)
Glucose-Capillary: 89 mg/dL (ref 70–99)
Glucose-Capillary: 85 mg/dL (ref 70–99)
Glucose-Capillary: 79 mg/dL (ref 70–99)
Glucose-Capillary: 127 mg/dL — ABNORMAL HIGH (ref 70–99)
Glucose-Capillary: 97 mg/dL (ref 70–99)

## 2022-11-24 LAB — COMPREHENSIVE METABOLIC PANEL
ALT: 7 U/L (ref 0–44)
AST: 16 U/L (ref 15–41)
Albumin: 3.1 g/dL — ABNORMAL LOW (ref 3.5–5.0)
Alkaline Phosphatase: 46 U/L (ref 38–126)
Anion gap: 10 (ref 5–15)
BUN: 60 mg/dL — ABNORMAL HIGH (ref 6–20)
CO2: 28 mmol/L (ref 22–32)
Calcium: 9.2 mg/dL (ref 8.9–10.3)
Chloride: 99 mmol/L (ref 98–111)
Creatinine, Ser: 11.65 mg/dL — ABNORMAL HIGH (ref 0.61–1.24)
GFR, Estimated: 5 mL/min — ABNORMAL LOW (ref >60)
Glucose, Bld: 98 mg/dL (ref 70–99)
Potassium: 4.4 mmol/L (ref 3.5–5.1)
Sodium: 137 mmol/L (ref 135–145)
Total Bilirubin: 0.5 mg/dL (ref 0.3–1.2)
Total Protein: 7.1 g/dL (ref 6.5–8.1)

## 2022-11-24 SURGERY — IRRIGATION AND DEBRIDEMENT WOUND
Anesthesia: General | Site: Leg Lower | Laterality: Left

## 2022-11-24 MED ORDER — ONDANSETRON HCL 4 MG/2ML IJ SOLN
INTRAMUSCULAR | Status: AC
  Filled 2022-11-24: qty 2

## 2022-11-24 MED ORDER — LIDOCAINE HCL (CARDIAC) PF 100 MG/5ML IV SOSY
PREFILLED_SYRINGE | INTRAVENOUS | Status: DC | PRN
  Administered 2022-11-24: 80 mg via INTRAVENOUS

## 2022-11-24 MED ORDER — 0.9 % SODIUM CHLORIDE (POUR BTL) OPTIME
TOPICAL | Status: DC | PRN
  Administered 2022-11-24: 500 mL

## 2022-11-24 MED ORDER — FENTANYL CITRATE PF 50 MCG/ML IJ SOSY: 12.5000 ug | PREFILLED_SYRINGE | Freq: Once | INTRAMUSCULAR | Status: DC | PRN

## 2022-11-24 MED ORDER — OXYCODONE HCL 5 MG PO TABS
5.0000 mg | ORAL_TABLET | Freq: Four times a day (QID) | ORAL | Status: DC | PRN
  Administered 2022-11-24 – 2022-11-25 (×3): 5 mg via ORAL
  Filled 2022-11-24 (×3): qty 1

## 2022-11-24 MED ORDER — PROPOFOL 10 MG/ML IV BOLUS
INTRAVENOUS | Status: AC
  Filled 2022-11-24: qty 20

## 2022-11-24 MED ORDER — PHENYLEPHRINE HCL-NACL 20-0.9 MG/250ML-% IV SOLN
INTRAVENOUS | Status: DC | PRN
  Administered 2022-11-24: 50 ug/min via INTRAVENOUS

## 2022-11-24 MED ORDER — OXYCODONE HCL 5 MG/5ML PO SOLN: 5.0000 mg | Freq: Once | ORAL | Status: DC | PRN

## 2022-11-24 MED ORDER — SUCCINYLCHOLINE CHLORIDE 200 MG/10ML IV SOSY
PREFILLED_SYRINGE | INTRAVENOUS | Status: DC | PRN
  Administered 2022-11-24: 120 mg via INTRAVENOUS

## 2022-11-24 MED ORDER — SEVOFLURANE IN SOLN
RESPIRATORY_TRACT | Status: AC
  Filled 2022-11-24: qty 250

## 2022-11-24 MED ORDER — ONDANSETRON HCL 4 MG/2ML IJ SOLN: 4.0000 mg | Freq: Four times a day (QID) | INTRAMUSCULAR | Status: DC | PRN

## 2022-11-24 MED ORDER — EPHEDRINE 5 MG/ML INJ
INTRAVENOUS | Status: AC
  Filled 2022-11-24: qty 5

## 2022-11-24 MED ORDER — PROPOFOL 10 MG/ML IV BOLUS
INTRAVENOUS | Status: DC | PRN
  Administered 2022-11-24: 160 mg via INTRAVENOUS

## 2022-11-24 MED ORDER — LIDOCAINE HCL (PF) 2 % IJ SOLN
INTRAMUSCULAR | Status: AC
  Filled 2022-11-24: qty 5

## 2022-11-24 MED ORDER — FENTANYL CITRATE (PF) 100 MCG/2ML IJ SOLN
25.0000 ug | INTRAMUSCULAR | Status: DC | PRN
  Administered 2022-11-24: 25 ug via INTRAVENOUS

## 2022-11-24 MED ORDER — FENTANYL CITRATE (PF) 100 MCG/2ML IJ SOLN
INTRAMUSCULAR | Status: DC | PRN
  Administered 2022-11-24: 50 ug via INTRAVENOUS

## 2022-11-24 MED ORDER — ACETAMINOPHEN 10 MG/ML IV SOLN: 1000.0000 mg | Freq: Once | INTRAVENOUS | Status: DC | PRN

## 2022-11-24 MED ORDER — OXYCODONE HCL 5 MG PO TABS: 5.0000 mg | ORAL_TABLET | Freq: Once | ORAL | Status: DC | PRN

## 2022-11-24 MED ORDER — EPHEDRINE SULFATE (PRESSORS) 50 MG/ML IJ SOLN
INTRAMUSCULAR | Status: DC | PRN
  Administered 2022-11-24: 5 mg via INTRAVENOUS

## 2022-11-24 MED ORDER — HYDROMORPHONE HCL 1 MG/ML IJ SOLN: 1.0000 mg | Freq: Once | INTRAMUSCULAR | Status: DC | PRN

## 2022-11-24 MED ORDER — ONDANSETRON HCL 4 MG/2ML IJ SOLN: 4.0000 mg | Freq: Once | INTRAMUSCULAR | Status: DC | PRN

## 2022-11-24 MED ORDER — FENTANYL CITRATE (PF) 100 MCG/2ML IJ SOLN
INTRAMUSCULAR | Status: AC
  Administered 2022-11-24: 25 ug via INTRAVENOUS
  Filled 2022-11-24: qty 2

## 2022-11-24 MED ORDER — PHENYLEPHRINE HCL-NACL 20-0.9 MG/250ML-% IV SOLN
INTRAVENOUS | Status: AC
  Filled 2022-11-24: qty 250

## 2022-11-24 MED ORDER — ACETAMINOPHEN 10 MG/ML IV SOLN
INTRAVENOUS | Status: DC | PRN
  Administered 2022-11-24: 1000 mg via INTRAVENOUS

## 2022-11-24 MED ORDER — FENTANYL CITRATE (PF) 100 MCG/2ML IJ SOLN
INTRAMUSCULAR | Status: AC
  Filled 2022-11-24: qty 2

## 2022-11-24 MED ORDER — ACETAMINOPHEN 10 MG/ML IV SOLN
INTRAVENOUS | Status: AC
  Filled 2022-11-24: qty 100

## 2022-11-24 MED ORDER — ONDANSETRON HCL 4 MG/2ML IJ SOLN
INTRAMUSCULAR | Status: DC | PRN
  Administered 2022-11-24: 4 mg via INTRAVENOUS

## 2022-11-24 SURGICAL SUPPLY — 30 items
AGENT HMST PWDR BTL CLGN 5GM (Miscellaneous) ×1 IMPLANT
AGENT HMST PWDR HDRLZ BVN CLGN (Miscellaneous) ×1 IMPLANT
BNDG GAUZE DERMACEA FLUFF 4 (GAUZE/BANDAGES/DRESSINGS) IMPLANT
BNDG GZE DERMACEA 4 6PLY (GAUZE/BANDAGES/DRESSINGS) ×1
COLLAGEN CELLERATERX 1 GRAM (Miscellaneous) IMPLANT
COLLAGEN CELLERATERX 5 GRAM (Miscellaneous) IMPLANT
DRAPE EXTREMITY 106X87X128.5 (DRAPES) IMPLANT
DRSG AQUACEL ADVANTAGE 4X5 (GAUZE/BANDAGES/DRESSINGS) IMPLANT
DRSG EMULSION OIL 3X8 NADH (GAUZE/BANDAGES/DRESSINGS) IMPLANT
ELECT REM PT RETURN 9FT ADLT (ELECTROSURGICAL) ×1
ELECTRODE REM PT RTRN 9FT ADLT (ELECTROSURGICAL) ×1 IMPLANT
GAUZE SPONGE 4X4 12PLY STRL (GAUZE/BANDAGES/DRESSINGS) IMPLANT
GLOVE SURG SYN 8.0 (GLOVE) ×1 IMPLANT
GLOVE SURG SYN 8.0 PF PI (GLOVE) ×1 IMPLANT
GOWN STRL REUS W/ TWL LRG LVL3 (GOWN DISPOSABLE) ×2 IMPLANT
GOWN STRL REUS W/ TWL XL LVL3 (GOWN DISPOSABLE) ×1 IMPLANT
GOWN STRL REUS W/TWL LRG LVL3 (GOWN DISPOSABLE) ×2
GOWN STRL REUS W/TWL XL LVL3 (GOWN DISPOSABLE) ×1
HANDLE YANKAUER SUCT BULB TIP (MISCELLANEOUS) IMPLANT
KIT TURNOVER KIT A (KITS) ×1 IMPLANT
LABEL OR SOLS (LABEL) ×1 IMPLANT
MANIFOLD NEPTUNE II (INSTRUMENTS) ×1 IMPLANT
NS IRRIG 500ML POUR BTL (IV SOLUTION) ×1 IMPLANT
PACK EXTREMITY ARMC (MISCELLANEOUS) ×1 IMPLANT
PAD ABD DERMACEA PRESS 5X9 (GAUZE/BANDAGES/DRESSINGS) IMPLANT
PAD PREP 24X41 OB/GYN DISP (PERSONAL CARE ITEMS) ×1 IMPLANT
SOL PREP PVP 2OZ (MISCELLANEOUS) ×2
SOLUTION PREP PVP 2OZ (MISCELLANEOUS) ×1 IMPLANT
STOCKINETTE IMPERV 14X48 (MISCELLANEOUS) ×1 IMPLANT
TRAP FLUID SMOKE EVACUATOR (MISCELLANEOUS) ×1 IMPLANT

## 2022-11-24 NOTE — Anesthesia Preprocedure Evaluation (Addendum)
Anesthesia Evaluation  Patient identified by MRN, date of birth, ID band Patient awake    Reviewed: Allergy & Precautions, H&P , NPO status , Patient's Chart, lab work & pertinent test results  History of Anesthesia Complications Negative for: history of anesthetic complications  Airway Mallampati: III  TM Distance: >3 FB Neck ROM: full   Comment: Large neck Dental  (+) Chipped, Poor Dentition, Missing   Pulmonary shortness of breath, sleep apnea , pneumonia, resolved, former smoker Persistent cough since COVID 09/27/22   Pulmonary exam normal  + decreased breath sounds      Cardiovascular hypertension, (-) angina + Peripheral Vascular Disease (LE gangrene; on Plavix and warfarin) and + Orthopnea  (-) Past MI Normal cardiovascular exam(-) dysrhythmias  Rhythm:Regular Rate:Normal  ECG 11/10/22: normal   Neuro/Psych negative neurological ROS  negative psych ROS   GI/Hepatic negative GI ROS, Neg liver ROS,neg GERD  ,,  Endo/Other  diabetes, Type 2, Insulin Dependent  Morbid obesityClass 3 obesity; pituitary microadenoma; hyperprolactinemia  Renal/GU ESRF and DialysisRenal diseaseLast dialyzed yesterday     Musculoskeletal   Abdominal  (+) + obese  Peds  Hematology  (+) Blood dyscrasia, anemia   Anesthesia Other Findings Past Medical History: No date: Anemia No date: Chronic kidney disease     Comment:  14% FUNCTION No date: Diabetes mellitus without complication (HCC) No date: Dyspnea     Comment:  DOE No date: Dysrhythmia No date: End stage renal disease (HCC) No date: Hypertension No date: Lymphedema of leg No date: Metabolic encephalopathy     Comment:  error No date: Microalbuminuria No date: Obesity No date: Sleep apnea     Comment:  no CPAP No date: Vitamin D deficiency  Past Surgical History: 10/24/2018: A/V FISTULAGRAM; Right     Comment:  Procedure: A/V FISTULAGRAM;  Surgeon: Katha Cabal, MD;  Location: [place] [facility];  Service:               Cardiovascular;  Laterality: Right; 11/24/2018: A/V FISTULAGRAM; Right     Comment:  Procedure: A/V FISTULAGRAM;  Surgeon: Katha Cabal, MD;  Location: [place] [facility];  Service:               Cardiovascular;  Laterality: Right; 07/31/2019: A/V FISTULAGRAM; Left     Comment:  Procedure: A/V FISTULAGRAM;  Surgeon: Katha Cabal, MD;  Location: [place] [facility];  Service:               Cardiovascular;  Laterality: Left; 12/11/2019: A/V FISTULAGRAM; Left     Comment:  Procedure: A/V FISTULAGRAM;  Surgeon: Katha Cabal, MD;  Location: [place] [facility];  Service:               Cardiovascular;  Laterality: Left; 02/21/2019: A/V SHUNT INTERVENTION; Right     Comment:  Procedure: A/V SHUNT INTERVENTION;  Surgeon: Katha Cabal, MD;  Location: [place] [facility];  Service:              Cardiovascular;  Laterality: Right; 0000000: APPLICATION OF WOUND VAC;  Left     Comment:  Procedure: APPLICATION OF WOUND VAC;  Surgeon: Algernon Huxley, MD;  Location: [facility];  Service: General;                Laterality: Left; [id number]: APPLICATION OF WOUND VAC; Left     Comment:  Procedure: APPLICATION OF WOUND VAC;  Surgeon: Katha Cabal, MD;  Location: [facility];  Service: Vascular;                Laterality: Left; [id number]: APPLICATION OF WOUND VAC; Left     Comment:  Procedure: WOUND VAC CHANGE;  Surgeon: Katha Cabal, MD;  Location: [facility];  Service: Vascular;                Laterality: Left;  left lower leg [id number]: APPLICATION OF WOUND VAC; Left     Comment:  Procedure: WOUND VAC CHANGE;  Surgeon: Katha Cabal, MD;  Location: [facility];  Service: Vascular;                Laterality: Left; [id number]: APPLICATION OF WOUND VAC; Left     Comment:  Procedure: APPLICATION  OF WOUND VAC;  Surgeon: Katha Cabal, MD;  Location: [facility];  Service: Vascular;                Laterality: Left; 07/19/2018: AV FISTULA INSERTION W/ RF MAGNETIC GUIDANCE; Right     Comment:  Procedure: AV FISTULA INSERTION W/RF MAGNETIC GUIDANCE;               Surgeon: Katha Cabal, MD;  Location: [place]              [facility];  Service: Cardiovascular;  Laterality: Right; 05/11/2019: AV FISTULA PLACEMENT; Left     Comment:  Procedure: ARTERIOVENOUS (AV) FISTULA CREATION               (RADIOCEPHALIC );  Surgeon: Katha Cabal, MD;                Location: [facility];  Service: Vascular;  Laterality:               Left; 11/01/2018: CATARACT EXTRACTION W/PHACO; Left     Comment:  Procedure: CATARACT EXTRACTION PHACO AND INTRAOCULAR               LENS PLACEMENT (IOC)-LEFT, DIABETIC-INSULIN DEPENDENT;                Surgeon: Eulogio Bear, MD;  Location: [facility];                Service: Ophthalmology;  Laterality: Left;  [place]               00:41.8 CDE 4.61 Fluid Pack Lot # [id number] 04/27/2019: CATARACT EXTRACTION W/PHACO; Right     Comment:  Procedure: CATARACT EXTRACTION PHACO AND INTRAOCULAR               LENS PLACEMENT (IOC);  Surgeon: Eulogio Bear, MD;  Location: [facility];  Service: Ophthalmology;  Laterality:              Right;  [place] 00:34 CDE 1.97 FLUID PACK LOT # [id number]  02/23/2019: DIALYSIS/PERMA CATHETER INSERTION; N/A     Comment:  Procedure: DIALYSIS/PERMA CATHETER INSERTION;  Surgeon:               Algernon Huxley, MD;  Location: [place] [facility];                Service: Cardiovascular;  Laterality: N/A; 10/11/2017: I & D EXTREMITY; Left     Comment:  Procedure: IRRIGATION AND DEBRIDEMENT EXTREMITY;                Surgeon: Katha Cabal, MD;  Location: [facility];                Service: Vascular;  Laterality: Left; 10/07/2017: INCISION AND DRAINAGE ABSCESS; Right     Comment:  Procedure: INCISION AND DRAINAGE  ABSCESS;  Surgeon:               Katha Cabal, MD;  Location: [facility];  Service:               Vascular;  Laterality: Right; 10/03/2017: IRRIGATION AND DEBRIDEMENT ABSCESS; Left     Comment:  Procedure: IRRIGATION AND [place] with               debridement of skin, soft tissue, muscle 50sq cm;                Surgeon: Algernon Huxley, MD;  Location: [facility];  Service:              General;  Laterality: Left; 02/20/2019: TEMPORARY DIALYSIS CATHETER     Comment:  Procedure: TEMPORARY DIALYSIS CATHETER;  Surgeon:               Katha Cabal, MD;  Location: [place] [facility];               Service: Cardiovascular;; 09/18/2019: UPPER EXTREMITY ANGIOGRAPHY; Left     Comment:  Procedure: UPPER EXTREMITY ANGIOGRAPHY;  Surgeon:               Katha Cabal, MD;  Location: [place] [facility];               Service: Cardiovascular;  Laterality: Left;  BMI    Body Mass Index: 47.47 kg/m      Reproductive/Obstetrics negative OB ROS                             Anesthesia Physical Anesthesia Plan  ASA: 4  Anesthesia Plan: General   Post-op Pain Management:    Induction: Intravenous  PONV Risk Score and Plan: 2 and Ondansetron, Dexamethasone, Treatment may vary due to age or medical condition and Midazolam  Airway Management Planned: Oral ETT  Additional Equipment: None  Intra-op Plan:   Post-operative Plan: Extubation in OR  Informed Consent: I have reviewed the patients History and Physical, chart, labs and discussed the procedure including the risks, benefits and alternatives for the proposed anesthesia with the patient or authorized representative who has indicated his/her understanding and acceptance.     Dental Advisory Given  Plan Discussed with: Anesthesiologist, CRNA and Surgeon  Anesthesia Plan Comments: (Discussed risks of anesthesia with patient, including PONV, sore throat, lip/dental/eye damage. Rare risks discussed  as well, such as cardiorespiratory and neurological sequelae, and allergic reactions. Discussed the role of CRNA in patient's perioperative care. Patient understands.)        Anesthesia Quick Evaluation

## 2022-11-24 NOTE — Op Note (Signed)
    OPERATIVE NOTE   PROCEDURE: Debridement venous ulcer left ankle and left dorsum of the foot Placement of a compression dressing using CellerateRX, Aquacel Ag with Curlex and an Ace wrap for compression  PRE-OPERATIVE DIAGNOSIS: Left lower extremity venous ankle ulcer and venous dorsal foot ulcer  POST-OPERATIVE DIAGNOSIS: Same  SURGEON: [name]  ASSISTANT(S): Mr Annalee Genta, NP  ANESTHESIA: general  ESTIMATED BLOOD LOSS: 10 cc  FINDING(S): The wound continues to be well granulated.  Today I found several areas that indeed needed debridement but no evidence of infection  SPECIMEN(S): None  INDICATIONS:   [name] is a 52 y.o. male who presents with venous ulcer.  He has not completed his 14 days of antibiotics.  We have been changing his wound in the OR and therefore are again doing his wound care in the operating room with the hopes of transitioning to the wound center.  DESCRIPTION: After full informed written consent was obtained from the patient, the patient was brought back to the operating room and placed supine upon the operating table.  Prior to induction, the patient received IV antibiotics.   After obtaining adequate anesthesia, the patient was then prepped and draped in the standard fashion for a debridement with placement of a wound dressing left lower extremity.  The leg is undressed and the VAC is removed.  The wound is then prepped with Betadine.  Inspection of the wound demonstrates a rich granulation base with several areas of devitalized tissue.  These patchy areas are debrided with a scalpel and forceps.  The wound was then cleansed.  Today's measurements the ankle wound now measures 15 cm x 14 cm, 210 cm2 and 4 cm x 4 cm, 16 cm2  The CellerateRX was then placed in the bed of the wound, followed by a Adaptic and then Aquacel Ag to ABD pads Curlex and then a 4 inch Ace across the foot to the ankle and a 6 inch Ace x 2 from the ankle to the  knee.   The patient tolerated this procedure well.   COMPLICATIONS: None  CONDITION: [name]  Office: [phone]   11/24/2022, 2:17 PM

## 2022-11-24 NOTE — Interval H&P Note (Signed)
History and Physical Interval Note:  11/24/2022 12:52 PM  [name]  has presented today for surgery, with the diagnosis of Non-pressure chronic ulcer of unspecified part of unspecified lower leg with necrosis of muscle, Cellulitis of left lower limb.  The various methods of treatment have been discussed with the patient and family. After consideration of risks, benefits and other options for treatment, the patient has consented to  Procedure(s): DEBRIDEMENT WOUND LEFT LOWER EXTREMITY (Left) WOUND VAC EXCHANGE (Left) as a surgical intervention.  The patient's history has been reviewed, patient examined, no change in status, stable for surgery.  I have reviewed the patient's chart and labs.  Questions were answered to the patient's satisfaction.     [name]

## 2022-11-24 NOTE — TOC Progression Note (Signed)
Transition of Care [facility]) - Progression Note    Patient Details  Name: [name] MRN: [id number] Date of Birth: 07/22/1971  Transition of Care [facility]) CM/SW Contact  Beverly Sessions, RN Phone Number: 11/24/2022, 8:44 AM  Clinical Narrative:     TOC to follow up with vascular post op to determine if they are ready for home vac to be ordered        Expected Discharge Plan and Services                                               Social Determinants of Health (SDOH) Interventions SDOH Screenings   Food Insecurity: No Food Insecurity (11/10/2022)  Housing: [place]  (11/10/2022)  Transportation Needs: No Transportation Needs (11/10/2022)  Utilities: Not At Risk (11/10/2022)  Tobacco Use: Medium Risk (11/18/2022)    Readmission Risk Interventions     No data to display

## 2022-11-24 NOTE — Anesthesia Postprocedure Evaluation (Signed)
Anesthesia Post Note  Patient: [name]  Procedure(s) Performed: DEBRIDEMENT WOUND LEFT LOWER EXTREMITY (Left: Leg Lower) WOUND VAC REMOVAL (Left: Leg Lower)  Patient location during evaluation: PACU Anesthesia Type: General Level of consciousness: awake and alert Pain management: pain level controlled Vital Signs Assessment: post-procedure vital signs reviewed and stable Respiratory status: spontaneous breathing, nonlabored ventilation, respiratory function stable and patient connected to nasal cannula oxygen Cardiovascular status: blood pressure returned to baseline and stable Postop Assessment: no apparent nausea or vomiting Anesthetic complications: no   No notable events documented.   Last Vitals:  Vitals:   11/24/22 1408 11/24/22 1415  BP: (!) 150/77 (!) 140/73  Pulse: 63 65  Resp: 19 (!) 7  Temp: (!) 36.2 C   SpO2: 100% 95%    Last Pain:  Vitals:   11/24/22 1415  TempSrc:   PainSc: 5                  Arita Miss

## 2022-11-24 NOTE — TOC Progression Note (Signed)
Transition of Care [facility]) - Progression Note    Patient Details  Name: RAYMEL VANDONGEN MRN: [id number] Date of Birth: 1971/02/15  Transition of Care [facility]) CM/SW Contact  Beverly Sessions, RN Phone Number: 11/24/2022, 7:39 PM  Clinical Narrative:     Notified by PA that anticipated that patient will discharge tomorrow.  No longer has wound vac.  Will require home health wound care on Mondays and Fridays,  and will go to the wound care clinic on Wednesdays  Patient states that he is not happy with his currently home health agency [facility] and would like to see if there is another option available.  Patient states that he does not have a preference of agency.  Referral made to [facility] with [facility].  They would be able to accept patient with start of care on Saturday vs Sunday depending on HD schedule time.  Message sent to [facility] vascular PA to determine if this is acceptable        Expected Discharge Plan and Services                                               Social Determinants of Health (SDOH) Interventions SDOH Screenings   Food Insecurity: No Food Insecurity (11/10/2022)  Housing: Low Risk  (11/10/2022)  Transportation Needs: No Transportation Needs (11/10/2022)  Utilities: Not At Risk (11/10/2022)  Tobacco Use: Medium Risk (11/24/2022)    Readmission Risk Interventions     No data to display

## 2022-11-24 NOTE — Transfer of Care (Signed)
Immediate Anesthesia Transfer of Care Note  Patient: [name]  Procedure(s) Performed: DEBRIDEMENT WOUND LEFT LOWER EXTREMITY (Left) WOUND VAC EXCHANGE (Left)  Patient Location: PACU  Anesthesia Type:General  Level of Consciousness: awake, alert , and oriented  Airway & Oxygen Therapy: Patient Spontanous Breathing and Patient connected to face mask oxygen  Post-op Assessment: Report given to RN and Post -op Vital signs reviewed and stable  Post vital signs: Reviewed and stable  Last Vitals:  Vitals Value Taken Time  BP    Temp    Pulse 63 11/24/22 1408  Resp 19 11/24/22 1408  SpO2 100 % 11/24/22 1408  Vitals shown include unvalidated device data.  Last Pain:  Vitals:   11/24/22 1243  TempSrc: Temporal  PainSc: 0-No pain      Patients Stated Pain Goal: 0 ([id number])  Complications: No notable events documented.

## 2022-11-24 NOTE — Progress Notes (Signed)
[facility]  ROUNDING NOTE   Subjective:   [name] is a 52 year old male with past medical conditions including anemia, hypertension, diabetes, PVD, sleep apnea, vitamin D deficiency, and end-stage renal disease on hemodialysis.  Patient presents to the hospital for scheduled debridement of left lower venous ulcers.  Patient currently admitted for Gangrene of lower extremity ([place]) [I96]  Patient seen resting in bed Alert and oriented Currently n.p.o. for scheduled procedure Remains on room air Trace lower extremity edema  Wound vacs remain in place    Objective:  Vital signs in last 24 hours:  Temp:  [97.9 F (36.6 C)-98.6 F (37 C)] 98 F (36.7 C) (03/27 0756) Pulse Rate:  [56-67] 58 (03/27 0756) Resp:  [18-24] 18 (03/27 0756) BP: (98-150)/(36-82) 136/76 (03/27 0756) SpO2:  [96 %-100 %] 100 % (03/27 0756) Weight:  [175 kg-182 kg] 182 kg (03/27 0449)  Weight change:  Filed Weights   11/23/22 0847 11/23/22 1242 11/24/22 0449  Weight: (!) 177.1 kg (!) 175 kg (!) 182 kg    Intake/Output: I/O last 3 completed shifts: In: 240 [P.O.:240] Out: 2100 [Drains:100; Other:2000]   Intake/Output this shift:  No intake/output data recorded.  Physical Exam: General: NAD, laying in bed  Head: Normocephalic, atraumatic. Moist oral mucosal membranes  Eyes: Anicteric  Lungs:  Clear to auscultation, normal effort, room air  Heart: Regular rate and rhythm  Abdomen:  Soft, nontender, obese  Extremities: trace RLE peripheral edema  Neurologic: Nonfocal, moving all four extremities  Skin: No lesions, LLE wounds with wound VAC. +edema   Access: Lt AVG    Basic Metabolic Panel: Recent Labs  Lab 11/18/22 0935 11/20/22 1416 11/23/22 0838 11/24/22 0939  NA 137 136 135 137  K 4.4 5.1 4.5 4.4  CL 99 99 100 99  CO2 25 26 25 28   GLUCOSE 132* 140* 104* 98  BUN 63* 69* 75* 60*  CREATININE 12.75* 12.69* 14.88* 11.65*  CALCIUM 9.2 9.2 8.8* 9.2  PHOS 6.9* 6.0* 6.2*   --      Liver Function Tests: Recent Labs  Lab 11/18/22 0935 11/20/22 1416 11/23/22 0838 11/24/22 0939  AST  --   --   --  16  ALT  --   --   --  7  ALKPHOS  --   --   --  46  BILITOT  --   --   --  0.5  PROT  --   --   --  7.1  ALBUMIN 3.1* 3.1* 3.1* 3.1*    No results for input(s): "LIPASE", "AMYLASE" in the last 168 hours. No results for input(s): "AMMONIA" in the last 168 hours.  CBC: Recent Labs  Lab 11/18/22 0935 11/20/22 1416 11/23/22 0838  WBC 5.1 5.0 5.7  HGB 9.5* 9.6* 9.2*  HCT 30.8* 30.7* 29.0*  MCV 102.0* 100.0 98.6  PLT 176 179 161     Cardiac Enzymes: No results for input(s): "CKTOTAL", "CKMB", "CKMBINDEX", "TROPONINI" in the last 168 hours.  BNP: Invalid input(s): "POCBNP"  CBG: Recent Labs  Lab 11/23/22 0716 11/23/22 1313 11/23/22 1636 11/23/22 2110 11/24/22 0803  GLUCAP 100* 104* 156* 127* 89     Microbiology: Results for orders placed or performed during the hospital encounter of 11/10/22  Aerobic/Anaerobic Culture w Gram Stain (surgical/deep wound)     Status: None   Collection Time: 11/10/22 12:45 PM   Specimen: Leg, Left; Wound  Result Value Ref Range Status   Specimen Description   Final  LEG LEFT CALF WOUND Performed at [facility], [address] [place], [place] [address]    Special Requests   Final    NONE Performed at [facility], [place], [place] [address]    Gram Stain NO WBC SEEN RARE GRAM NEGATIVE RODS   Final   Culture   Final    FEW ESCHERICHIA COLI RARE ENTEROCOCCUS FAECALIS NO ANAEROBES ISOLATED Performed at [facility], [place] [address], [place], [place] [address]    Report Status 11/15/2022 FINAL  Final   Organism ID, Bacteria ESCHERICHIA COLI  Final   Organism ID, Bacteria ENTEROCOCCUS FAECALIS  Final      Susceptibility   Escherichia coli - MIC*    AMPICILLIN 4 SENSITIVE Sensitive     CEFEPIME <=0.12 SENSITIVE Sensitive     CEFTAZIDIME <=1 SENSITIVE  Sensitive     CEFTRIAXONE <=0.25 SENSITIVE Sensitive     CIPROFLOXACIN <=0.25 SENSITIVE Sensitive     GENTAMICIN <=1 SENSITIVE Sensitive     IMIPENEM <=0.25 SENSITIVE Sensitive     TRIMETH/SULFA >=320 RESISTANT Resistant     AMPICILLIN/SULBACTAM <=2 SENSITIVE Sensitive     PIP/TAZO <=4 SENSITIVE Sensitive     * FEW ESCHERICHIA COLI   Enterococcus faecalis - MIC*    AMPICILLIN <=2 SENSITIVE Sensitive     VANCOMYCIN 1 SENSITIVE Sensitive     GENTAMICIN SYNERGY SENSITIVE Sensitive     LINEZOLID 2 SENSITIVE Sensitive     * RARE ENTEROCOCCUS FAECALIS  Aerobic/Anaerobic Culture w Gram Stain (surgical/deep wound)     Status: None   Collection Time: 11/10/22  1:00 PM   Specimen: Foot, Left; Wound  Result Value Ref Range Status   Specimen Description   Final    FOOT LEFT FOOT Performed at [facility], [address], [place], [place] [address]    Special Requests   Final    NONE Performed at [facility], [place], [place] [address]    Gram Stain NO WBC SEEN NO ORGANISMS SEEN   Final   Culture   Final    RARE ESCHERICHIA COLI RARE SERRATIA MARCESCENS RARE ENTEROCOCCUS FAECALIS NO ANAEROBES ISOLATED Performed at [facility], [address], [place], [place] [address]    Report Status 11/15/2022 FINAL  Final   Organism ID, Bacteria ESCHERICHIA COLI  Final   Organism ID, Bacteria SERRATIA MARCESCENS  Final   Organism ID, Bacteria ENTEROCOCCUS FAECALIS  Final      Susceptibility   Escherichia coli - MIC*    AMPICILLIN <=2 SENSITIVE Sensitive     CEFEPIME <=0.12 SENSITIVE Sensitive     CEFTAZIDIME <=1 SENSITIVE Sensitive     CEFTRIAXONE <=0.25 SENSITIVE Sensitive     CIPROFLOXACIN <=0.25 SENSITIVE Sensitive     GENTAMICIN <=1 SENSITIVE Sensitive     IMIPENEM <=0.25 SENSITIVE Sensitive     TRIMETH/SULFA <=20 SENSITIVE Sensitive     AMPICILLIN/SULBACTAM <=2 SENSITIVE Sensitive     PIP/TAZO <=4 SENSITIVE Sensitive     * RARE ESCHERICHIA  COLI   Enterococcus faecalis - MIC*    AMPICILLIN <=2 SENSITIVE Sensitive     VANCOMYCIN 1 SENSITIVE Sensitive     GENTAMICIN SYNERGY SENSITIVE Sensitive     LINEZOLID 2 SENSITIVE Sensitive     * RARE ENTEROCOCCUS FAECALIS   Serratia marcescens - MIC*    CEFEPIME <=0.12 SENSITIVE Sensitive     CEFTAZIDIME <=1 SENSITIVE Sensitive     CEFTRIAXONE <=0.25 SENSITIVE Sensitive  CIPROFLOXACIN <=0.25 SENSITIVE Sensitive     GENTAMICIN <=1 SENSITIVE Sensitive     TRIMETH/SULFA <=20 SENSITIVE Sensitive     * RARE SERRATIA MARCESCENS    Coagulation Studies: No results for input(s): "LABPROT", "INR" in the last 72 hours.   Urinalysis: No results for input(s): "COLORURINE", "LABSPEC", "PHURINE", "GLUCOSEU", "HGBUR", "BILIRUBINUR", "KETONESUR", "PROTEINUR", "UROBILINOGEN", "NITRITE", "LEUKOCYTESUR" in the last 72 hours.  Invalid input(s): "APPERANCEUR"    Imaging: No results found.   Medications:    sodium chloride 10 mL/hr at 11/24/22 1000   sodium chloride     ampicillin-sulbactam (UNASYN) IV 3 g (11/24/22 0014)   ciprofloxacin 400 mg (11/23/22 1732)    allopurinol  100 mg Oral Daily   amLODipine  10 mg Oral Once per day on Sun Mon Wed Fri   apixaban  5 mg Oral BID   vitamin C  500 mg Oral BID   atenolol  50 mg Oral Once per day on Sun Mon Wed Fri   atorvastatin  10 mg Oral Daily   calcium acetate  2,001 mg Oral TID WC   Chlorhexidine Gluconate Cloth  6 each Topical Q0600   cinacalcet  30 mg Oral Daily   clopidogrel  75 mg Oral Daily   docusate sodium  200 mg Oral BID   feeding supplement (NEPRO CARB STEADY)  237 mL Oral TID BM   insulin aspart  0-6 Units Subcutaneous TID WC   insulin glargine-yfgn  10 Units Subcutaneous QHS   multivitamin  1 tablet Oral QHS   polyethylene glycol  17 g Oral Daily   traZODone  50 mg Oral QHS   zinc sulfate  220 mg Oral Daily   sodium chloride, albuterol, benzonatate, bisacodyl, calcium acetate, diphenhydrAMINE, diphenhydrAMINE,  famotidine, lidocaine-prilocaine, loratadine, melatonin, methylPREDNISolone (SOLU-MEDROL) injection, midazolam, ondansetron (ZOFRAN) IV, mouth rinse, oxyCODONE-acetaminophen  Assessment/ Plan:  [name] is a 52 y.o.  male with past medical conditions including anemia, hypertension, diabetes, PVD, sleep apnea, vitamin D deficiency, and end-stage renal disease on hemodialysis.  Patient presents to the hospital for scheduled debridement of left lower venous ulcers.  Patient currently admitted for Gangrene of lower extremity ([place]) [I96]  [facility]/TTS / EDW 180 kg  End stage renal disease on hemodialysis. Continue TTS schedule  Dialysis received yesterday, UF 2 L achieved. Patient will need further adjustment in dry weight prior to discharge. Next treatment scheduled for Thursday.   2. Anemia of chronic kidney disease.  Lab Results  Component Value Date   HGB 9.2 (L) 11/23/2022    Patient receives Mircera at outpatient clinic.  Hemoglobin stable  3. Secondary Hyperparathyroidism: with outpatient labs: PTH 270 (3/12), phosphorus 6.0, calcium 9.2 .  Lab Results  Component Value Date   CALCIUM 9.2 11/24/2022   CAION 1.14 (L) 11/12/2022   PHOS 6.2 (H) 11/23/2022  Phosphorus remain slightly elevated. Continue calcium acetate with meals  4.  Hypertension with chronic kidney disease. Blood pressure at goal. Continue home regimen of amlodipine and atenolol.  Blood pressure within desired range for this patient.     LOS: [place] 3/27/202411:26 AM

## 2022-11-24 NOTE — Anesthesia Procedure Notes (Signed)
Procedure Name: Intubation Date/Time: 11/24/2022 1:16 PM  Performed by: Demetrius Charity, CRNAPre-anesthesia Checklist: Patient identified, Patient being monitored, Timeout performed, Emergency Drugs available and Suction available Patient Re-evaluated:Patient Re-evaluated prior to induction Oxygen Delivery Method: Circle system utilized Preoxygenation: Pre-oxygenation with 100% oxygen Induction Type: IV induction Ventilation: Two handed mask ventilation required Laryngoscope Size: McGraph and 4 Grade View: Grade I Tube type: Oral Tube size: 7.5 mm Number of attempts: 1 Airway Equipment and Method: Stylet Placement Confirmation: ETT inserted through vocal cords under direct vision, positive ETCO2 and breath sounds checked- equal and bilateral Secured at: 24 cm Tube secured with: Tape Dental Injury: Teeth and Oropharynx as per pre-operative assessment

## 2022-11-25 ENCOUNTER — Encounter: Payer: Self-pay | Admitting: Vascular Surgery

## 2022-11-25 ENCOUNTER — Other Ambulatory Visit (HOSPITAL_COMMUNITY): Payer: Self-pay

## 2022-11-25 LAB — CBC
HCT: 30.6 % — ABNORMAL LOW (ref 39.0–52.0)
Hemoglobin: 9.5 g/dL — ABNORMAL LOW (ref 13.0–17.0)
MCH: 30.9 pg (ref 26.0–34.0)
MCHC: 31 g/dL (ref 30.0–36.0)
MCV: 99.7 fL (ref 80.0–100.0)
Platelets: 156 10*3/uL (ref 150–400)
RBC: 3.07 MIL/uL — ABNORMAL LOW (ref 4.22–5.81)
RDW: 15 % (ref 11.5–15.5)
WBC: 4.7 10*3/uL (ref 4.0–10.5)
nRBC: 0 % (ref 0.0–0.2)

## 2022-11-25 LAB — RENAL FUNCTION PANEL
Albumin: 3.2 g/dL — ABNORMAL LOW (ref 3.5–5.0)
Anion gap: 16 — ABNORMAL HIGH (ref 5–15)
BUN: 67 mg/dL — ABNORMAL HIGH (ref 6–20)
CO2: 25 mmol/L (ref 22–32)
Calcium: 9.1 mg/dL (ref 8.9–10.3)
Chloride: 96 mmol/L — ABNORMAL LOW (ref 98–111)
Creatinine, Ser: 13.65 mg/dL — ABNORMAL HIGH (ref 0.61–1.24)
GFR, Estimated: 4 mL/min — ABNORMAL LOW (ref >60)
Glucose, Bld: 95 mg/dL (ref 70–99)
Phosphorus: 6.6 mg/dL — ABNORMAL HIGH (ref 2.5–4.6)
Potassium: 4.4 mmol/L (ref 3.5–5.1)
Sodium: 137 mmol/L (ref 135–145)

## 2022-11-25 MED ORDER — OXYCODONE HCL 5 MG PO TABS: 5.0000 mg | ORAL_TABLET | Freq: Four times a day (QID) | ORAL | 0 refills | Status: DC | PRN

## 2022-11-25 MED ORDER — APIXABAN 5 MG PO TABS: 5.0000 mg | ORAL_TABLET | Freq: Two times a day (BID) | ORAL | 3 refills | Status: DC

## 2022-11-25 NOTE — TOC Benefit Eligibility Note (Signed)
Patient Teacher, [name] as a foreign language completed.    The patient is currently admitted and upon discharge could be taking Eliquis 5 mg.  The current 30 day co-pay is $45.00.   The patient is insured through [employer] Part D   This test claim was processed through [place] amounts may vary at other pharmacies due to pharmacy/plan contracts, or as the patient moves through the different stages of their insurance plan.  Lyndel Safe, [place] Patient Advocate Specialist [place] Patient Advocate Team Direct Number: [phone]  Fax: [phone]

## 2022-11-25 NOTE — TOC Transition Note (Addendum)
Transition of Care [facility]) - CM/SW Discharge Note   Patient Details  Name: [name] MRN: [id number] Date of Birth: December 21, 1970  Transition of Care [facility]) CM/SW Contact:  Candie Chroman, LCSW Phone Number: 11/25/2022, 2:54 PM   Clinical Narrative:  Patient has orders to discharge home today. [facility] liaison is aware. Start of care will be Monday 4/1. [employer] liaison will update the office so they can discharge him. No further concerns. CSW signing off.   Final next level of care: [place] Barriers to Discharge: Barriers Resolved   Patient Goals and CMS Choice      Discharge Placement                      Patient and family notified of of transfer: 11/25/22  Discharge Plan and Services Additional resources added to the After Visit Summary for                            [facility] Arranged: RN [place]: [place] Date [facility] Agency Contacted: 11/25/22   Representative spoke with at [place]: [place] Determinants of Health ([place]) Interventions SDOH Screenings   Food Insecurity: No Food Insecurity (11/10/2022)  Housing: Low Risk  (11/10/2022)  Transportation Needs: No Transportation Needs (11/10/2022)  Utilities: Not At Risk (11/10/2022)  Tobacco Use: Medium Risk (11/25/2022)     Readmission Risk Interventions     No data to display

## 2022-11-25 NOTE — Progress Notes (Signed)
Patient notified of discharge by physician at bedside, physician changed LLE dressing. Discharge paperwork and education material provided to patient. Iv removed with no complication, all belongings gathered with patient and family. Patient escorted off unit by member of unit staff

## 2022-11-25 NOTE — Progress Notes (Signed)
Hemodialysis note  Received patient in bed to unit. Alert and oriented.  Informed consent signed and in chart.  Treatment initiated: [id number] Treatment completed: 1230  Patient tolerated well. Transported back to room, alert without acute distress.  Report given to patient's RN.   Access used: LUA AVG Access issues: none  Total UF removed: 2L Medication(s) given:  none  Post HD weight: 176.4 kg   [facility]

## 2022-11-25 NOTE — Discharge Summary (Signed)
[facility]    Discharge Summary    Patient ID:  [name] DOB/AGE: 10-02-1970 52 y.o.  Admit date: 11/10/2022 Discharge date: 11/25/2022 Date of Surgery: 11/24/2022 Surgeon: Surgeon(s): Schnier, Dolores Lory, MD  Admission Diagnosis: Gangrene of lower extremity [facility]) [I96]  Discharge Diagnoses:  Gangrene of lower extremity ([place]) [I96]  Secondary Diagnoses: Past Medical History:  Diagnosis Date   Anemia    Chronic kidney disease    14% FUNCTION   Diabetes mellitus without complication ([place])    Diabetic retinopathy ([place])    Dyspnea    DOE   Dysrhythmia    End stage renal disease ([place])    History of orthopnea    error wrong patietn   Hypercholesteremia    error   Hyperkalemia    error wrong patient   Hypertension    Long-term insulin use ([place])    Lymphedema of leg    Metabolic encephalopathy    error   Microalbuminuria    MSSA (methicillin susceptible Staphylococcus aureus)    Obesity    PVD (peripheral vascular disease) ([place])    Respiratory failure (H[facility])    Error   Sepsis ([place])    error   Sleep apnea    no CPAP   UTI (urinary tract infection)    error   Venous ulcer ([place])    Vitamin D deficiency     Procedure(s): DEBRIDEMENT WOUND LEFT LOWER EXTREMITY WOUND VAC REMOVAL  Discharged Condition: good  HPI:  [name] is a 52 year old male who is status post multiple left lower extremity incision and drainage with wound VAC VAC application for venous stasis ulcers.  Patient is also known hemodialysis patient on Tuesdays, Thursdays, Saturday.  Patient was examined in hemodialysis this morning.  He is resting comfortably.  Vitals are remained stable.  Patient's left lower extremity still remains swollen.  He now has wound vacs removed with wet-to-dry dressings in place.  Wound dressings consist of CellerateRX in the wound bed covered with Adaptic nonadherent.  Then covered with a layer of Aquacel.  ABD pads were  placed over the Aquacel.  Patient's leg was wrapped in Kerlix x 2.  Then patient's left lower extremity is wrapped with 6 inch Ace bandages on the calf wound and 4 inch Ace bandages to the foot wound.  Dressing changes are planned as follows: Home health will see the patient to do dressing changes at home on Monday and Friday every week.  Patient will come to the vein and vascular clinic on Wednesdays for dressing change with application of CellerateRX.  Patient was made aware of the plan.  Patient is in agreement with the plan patient to be discharged later today.  Patient is being discharged on Eliquis 5 mg twice daily.  His Coumadin was stopped as he was changed to Eliquis.  Patient will also continue on Plavix 75 mg daily.  Hospital Course:  [name] is a 52 y.o. male is S/P Left Extubated: POD # 0 Physical Exam:  Alert notes x3, no acute distress Face: Symmetrical.  Tongue is midline. Neck: Trachea is midline.  No swelling or bruising. Cardiovascular: Regular rate and rhythm Pulmonary: Clear to auscultation bilaterally Abdomen: Soft, nontender, nondistended Right groin access: Clean dry and intact.  No swelling or drainage noted Left groin access: Clean dry and intact.  No swelling or drainage noted Left lower extremity: Thigh +4 edema.  Calf +4 edema.  Extremities warm distally toes.  Hard to  palpate pedal pulses however the foot is warm is her poor/sluggish capillary refill. Right lower extremity: Thigh soft.  Calf soft.  Extremities warm distally toes.  Hard to palpate pedal pulses however the foot is warm is her good capillary refill. Neurological: No deficits noted   Post-op wounds:  clean, dry, intact or healing well.   Pt. Ambulating, voiding and taking PO diet without difficulty. Pt pain controlled with PO pain meds.  Labs:  As below  Complications: none  Consults:  Treatment Team:  Collier Bullock, MD  Significant Diagnostic Studies: CBC Lab Results   Component Value Date   WBC 4.7 11/25/2022   HGB 9.5 (L) 11/25/2022   HCT 30.6 (L) 11/25/2022   MCV 99.7 11/25/2022   PLT 156 11/25/2022    BMET    Component Value Date/Time   NA 137 11/25/2022 0844   K 4.4 11/25/2022 0844   CL 96 (L) 11/25/2022 0844   CO2 25 11/25/2022 0844   GLUCOSE 95 11/25/2022 0844   BUN 67 (H) 11/25/2022 0844   CREATININE 13.65 (H) 11/25/2022 0844   CALCIUM 9.1 11/25/2022 0844   GFRNONAA 4 (L) 11/25/2022 0844   GFRAA 9 (L) 02/19/2020 1316   COAG Lab Results  Component Value Date   INR 1.1 11/12/2022   INR 1.0 11/10/2022   INR 2.6 (H) 10/05/2022     Disposition:  Discharge to :Home  Allergies as of 11/25/2022       Reactions   Lisinopril Swelling   Lips mouth    Furosemide Cough   Light headed, blurry vision, weakness.   Latex    Adhesive [tape] Itching, Rash        Medication List     STOP taking these medications    warfarin 5 MG tablet Commonly known as: COUMADIN       TAKE these medications    acetaminophen 325 MG tablet Commonly known as: TYLENOL Take 2 tablets (650 mg total) by mouth every 6 (six) hours as needed for mild pain (or Fever >/= 101).   albuterol 108 (90 Base) MCG/ACT inhaler Commonly known as: VENTOLIN HFA Inhale 2 puffs into the lungs every 6 (six) hours as needed.   allopurinol 100 MG tablet Commonly known as: ZYLOPRIM Take 100 mg by mouth daily.   amLODipine 10 MG tablet Commonly known as: NORVASC Take 5 mg by mouth daily as needed (if bp is 190/90 or higher).   apixaban 5 MG Tabs tablet Commonly known as: ELIQUIS Take 1 tablet (5 mg total) by mouth 2 (two) times daily.   atenolol 50 MG tablet Commonly known as: TENORMIN Take 25 mg by mouth daily as needed (if bp is 190/90 or higher).   atorvastatin 10 MG tablet Commonly known as: Lipitor Take 1 tablet (10 mg total) by mouth daily. Further refills per primary care   bromocriptine 2.5 MG tablet Commonly known as: PARLODEL TAKE 1 TABLET  BY MOUTH AT BEDTIME.   calcium acetate 667 MG capsule Commonly known as: PHOSLO Take 1,334-2,001 mg by mouth See admin instructions. Take 2001 mg by mouth with each meal & take 1334 mg by mouth with each snack.   cinacalcet 30 MG tablet Commonly known as: SENSIPAR Take 30 mg by mouth daily.   CINNAMON PO Take 500 mg by mouth 2 (two) times daily.   clopidogrel 75 MG tablet Commonly known as: Plavix Take 1 tablet (75 mg total) by mouth daily.   EYE HEALTH PO Take 1 capsule by mouth daily.  ibuprofen 200 MG tablet Commonly known as: ADVIL Take 200 mg by mouth every 6 (six) hours as needed for headache or moderate pain.   Lantus SoloStar 100 UNIT/ML Solostar Pen Generic drug: insulin glargine Inject 15 Units into the skin at bedtime.   lidocaine-prilocaine cream Commonly known as: EMLA Apply 1 application topically as needed (port access).   loratadine 10 MG tablet Commonly known as: CLARITIN Take 10 mg by mouth daily as needed for allergies.   melatonin 5 MG Tabs Take 5 mg by mouth at bedtime as needed (sleep).   multivitamin with minerals Tabs tablet Take 1 tablet by mouth 3 (three) times a week. Men's Multivitamin   oxyCODONE 5 MG immediate release tablet Commonly known as: Oxy IR/ROXICODONE Take 1 tablet (5 mg total) by mouth every 6 (six) hours as needed for moderate pain or severe pain.   Rena-Vite Rx 1 MG Tabs Take 1 tablet by mouth 3 (three) times a week.   sildenafil 100 MG tablet Commonly known as: VIAGRA Take 1 tablet (100 mg total) by mouth daily as needed for erectile dysfunction.   SYSTANE OP Place 1 drop into both eyes daily as needed (dry eyes).   traZODone 50 MG tablet Commonly known as: DESYREL Take 50 mg by mouth at bedtime.       Verbal and written Discharge instructions given to the patient. Wound care per Discharge AVS  Follow-up Information     [facility] Follow up on 12/01/2022.   Specialty:  Vascular Surgery Why: Nursing visit for wound eval and dressing changes. Arna Medici or Warden Fillers will need to see wounds to assess. Patient will need Cellerix for wound bed. Contact information: [address] [place] [phone]        Care, [facility] Follow up.   Specialty: Home Health Services Why: They will follow up with you for your home health nursing needs. Start of care Monday 4/1. Contact information: [place] [address] [phone]                 Signed: Drema Pry, NP  11/25/2022, 2:28 PM

## 2022-11-25 NOTE — Plan of Care (Signed)
  Problem: Education: Goal: Understanding of CV disease, CV risk reduction, and recovery process will improve Outcome: Progressing Goal: Individualized Educational Video(s) Outcome: Progressing   Problem: Activity: Goal: Ability to return to baseline activity level will improve Outcome: Progressing   Problem: Cardiovascular: Goal: Ability to achieve and maintain adequate cardiovascular perfusion will improve Outcome: Progressing   

## 2022-11-25 NOTE — Progress Notes (Addendum)
[facility]  ROUNDING NOTE   Subjective:   [name] is a 52 year old male with past medical conditions including anemia, hypertension, diabetes, PVD, sleep apnea, vitamin D deficiency, and end-stage renal disease on hemodialysis.  Patient presents to the hospital for scheduled debridement of left lower venous ulcers.  Patient currently admitted for Gangrene of lower extremity ([place]) [I96]  Patient seen and evaluated during dialysis   HEMODIALYSIS FLOWSHEET:  Blood Flow Rate (mL/min): 400 mL/min Arterial Pressure (mmHg): -170 mmHg Venous Pressure (mmHg): 210 mmHg TMP (mmHg): 8 mmHg Ultrafiltration Rate (mL/min): 765 mL/min Dialysate Flow Rate (mL/min): 300 ml/min  Tolerating treatment well Denies pain    Objective:  Vital signs in last 24 hours:  Temp:  [97 F (36.1 C)-98.4 F (36.9 C)] 98.4 F (36.9 C) (03/28 1244) Pulse Rate:  [42-65] 57 (03/28 1244) Resp:  [7-26] 20 (03/28 1244) BP: (114-150)/(49-101) 123/49 (03/28 1244) SpO2:  [90 %-100 %] 98 % (03/28 1244) Weight:  [178.6 kg] 178.6 kg (03/28 0840)  Weight change: 4.9 kg Filed Weights   11/24/22 0449 11/24/22 1243 11/25/22 0840  Weight: (!) 182 kg (!) 182 kg (!) 178.6 kg    Intake/Output: I/O last 3 completed shifts: In: 1071.4 [P.O.:240; I.V.:231.4; IV Piggyback:600] Out: 42 [Drains:40; Blood:2]   Intake/Output this shift:  Total I/O In: -  Out: 2000 [Other:2000]  Physical Exam: General: NAD, laying in bed  Head: Normocephalic, atraumatic. Moist oral mucosal membranes  Eyes: Anicteric  Lungs:  Clear to auscultation, normal effort, room air  Heart: Regular rate and rhythm  Abdomen:  Soft, nontender, obese  Extremities: trace RLE peripheral edema  Neurologic: Nonfocal, moving all four extremities  Skin: No lesions, LLE wounds   Access: Lt AVG    Basic Metabolic Panel: Recent Labs  Lab 11/20/22 1416 11/23/22 0838 11/24/22 0939 11/25/22 0844  NA 136 135 137 137  K 5.1 4.5 4.4  4.4  CL 99 100 99 96*  CO2 26 25 28 25   GLUCOSE 140* 104* 98 95  BUN 69* 75* 60* 67*  CREATININE 12.69* 14.88* 11.65* 13.65*  CALCIUM 9.2 8.8* 9.2 9.1  PHOS 6.0* 6.2*  --  6.6*     Liver Function Tests: Recent Labs  Lab 11/20/22 1416 11/23/22 0838 11/24/22 0939 11/25/22 0844  AST  --   --  16  --   ALT  --   --  7  --   ALKPHOS  --   --  46  --   BILITOT  --   --  0.5  --   PROT  --   --  7.1  --   ALBUMIN 3.1* 3.1* 3.1* 3.2*    No results for input(s): "LIPASE", "AMYLASE" in the last 168 hours. No results for input(s): "AMMONIA" in the last 168 hours.  CBC: Recent Labs  Lab 11/20/22 1416 11/23/22 0838 11/25/22 0844  WBC 5.0 5.7 4.7  HGB 9.6* 9.2* 9.5*  HCT 30.7* 29.0* 30.6*  MCV 100.0 98.6 99.7  PLT 179 161 156     Cardiac Enzymes: No results for input(s): "CKTOTAL", "CKMB", "CKMBINDEX", "TROPONINI" in the last 168 hours.  BNP: Invalid input(s): "POCBNP"  CBG: Recent Labs  Lab 11/24/22 0803 11/24/22 1207 11/24/22 1409 11/24/22 1706 11/24/22 2027  GLUCAP 89 85 79 127* 58     Microbiology: Results for orders placed or performed during the hospital encounter of 11/10/22  Aerobic/Anaerobic Culture w Gram Stain (surgical/deep wound)     Status: None   Collection  Time: 11/10/22 12:45 PM   Specimen: Leg, Left; Wound  Result Value Ref Range Status   Specimen Description   Final    LEG LEFT CALF WOUND Performed at [facility], [address] [place], [place] [address]    Special Requests   Final    NONE Performed at [facility], [address] [place], [place] [address]    Gram Stain NO WBC SEEN RARE GRAM NEGATIVE RODS   Final   Culture   Final    FEW ESCHERICHIA COLI RARE ENTEROCOCCUS FAECALIS NO ANAEROBES ISOLATED Performed at [facility], [address], [place], [place] [address]    Report Status 11/15/2022 FINAL  Final   Organism ID, Bacteria ESCHERICHIA COLI  Final   Organism ID, Bacteria ENTEROCOCCUS  FAECALIS  Final      Susceptibility   Escherichia coli - MIC*    AMPICILLIN 4 SENSITIVE Sensitive     CEFEPIME <=0.12 SENSITIVE Sensitive     CEFTAZIDIME <=1 SENSITIVE Sensitive     CEFTRIAXONE <=0.25 SENSITIVE Sensitive     CIPROFLOXACIN <=0.25 SENSITIVE Sensitive     GENTAMICIN <=1 SENSITIVE Sensitive     IMIPENEM <=0.25 SENSITIVE Sensitive     TRIMETH/SULFA >=320 RESISTANT Resistant     AMPICILLIN/SULBACTAM <=2 SENSITIVE Sensitive     PIP/TAZO <=4 SENSITIVE Sensitive     * FEW ESCHERICHIA COLI   Enterococcus faecalis - MIC*    AMPICILLIN <=2 SENSITIVE Sensitive     VANCOMYCIN 1 SENSITIVE Sensitive     GENTAMICIN SYNERGY SENSITIVE Sensitive     LINEZOLID 2 SENSITIVE Sensitive     * RARE ENTEROCOCCUS FAECALIS  Aerobic/Anaerobic Culture w Gram Stain (surgical/deep wound)     Status: None   Collection Time: 11/10/22  1:00 PM   Specimen: Foot, Left; Wound  Result Value Ref Range Status   Specimen Description   Final    FOOT LEFT FOOT Performed at [facility], [address] [place], [place] [address]    Special Requests   Final    NONE Performed at [facility], [place], [place] [address]    Gram Stain NO WBC SEEN NO ORGANISMS SEEN   Final   Culture   Final    RARE ESCHERICHIA COLI RARE SERRATIA MARCESCENS RARE ENTEROCOCCUS FAECALIS NO ANAEROBES ISOLATED Performed at [facility], [address], [place], [place] [address]    Report Status 11/15/2022 FINAL  Final   Organism ID, Bacteria ESCHERICHIA COLI  Final   Organism ID, Bacteria SERRATIA MARCESCENS  Final   Organism ID, Bacteria ENTEROCOCCUS FAECALIS  Final      Susceptibility   Escherichia coli - MIC*    AMPICILLIN <=2 SENSITIVE Sensitive     CEFEPIME <=0.12 SENSITIVE Sensitive     CEFTAZIDIME <=1 SENSITIVE Sensitive     CEFTRIAXONE <=0.25 SENSITIVE Sensitive     CIPROFLOXACIN <=0.25 SENSITIVE Sensitive     GENTAMICIN <=1 SENSITIVE Sensitive     IMIPENEM <=0.25  SENSITIVE Sensitive     TRIMETH/SULFA <=20 SENSITIVE Sensitive     AMPICILLIN/SULBACTAM <=2 SENSITIVE Sensitive     PIP/TAZO <=4 SENSITIVE Sensitive     * RARE ESCHERICHIA COLI   Enterococcus faecalis - MIC*    AMPICILLIN <=2 SENSITIVE Sensitive     VANCOMYCIN 1 SENSITIVE Sensitive     GENTAMICIN SYNERGY SENSITIVE Sensitive     LINEZOLID 2 SENSITIVE Sensitive     * RARE ENTEROCOCCUS FAECALIS   Serratia marcescens -  MIC*    CEFEPIME <=0.12 SENSITIVE Sensitive     CEFTAZIDIME <=1 SENSITIVE Sensitive     CEFTRIAXONE <=0.25 SENSITIVE Sensitive     CIPROFLOXACIN <=0.25 SENSITIVE Sensitive     GENTAMICIN <=1 SENSITIVE Sensitive     TRIMETH/SULFA <=20 SENSITIVE Sensitive     * RARE SERRATIA MARCESCENS    Coagulation Studies: No results for input(s): "LABPROT", "INR" in the last 72 hours.   Urinalysis: No results for input(s): "COLORURINE", "LABSPEC", "PHURINE", "GLUCOSEU", "HGBUR", "BILIRUBINUR", "KETONESUR", "PROTEINUR", "UROBILINOGEN", "NITRITE", "LEUKOCYTESUR" in the last 72 hours.  Invalid input(s): "APPERANCEUR"    Imaging: No results found.   Medications:    sodium chloride 200 mL/hr at 11/25/22 0044   ampicillin-sulbactam (UNASYN) IV 3 g (11/25/22 0043)   ciprofloxacin Stopped (11/24/22 1831)    allopurinol  100 mg Oral Daily   amLODipine  10 mg Oral Once per day on Sun Mon Wed Fri   apixaban  5 mg Oral BID   vitamin C  500 mg Oral BID   atenolol  50 mg Oral Once per day on Sun Mon Wed Fri   atorvastatin  10 mg Oral Daily   calcium acetate  2,001 mg Oral TID WC   Chlorhexidine Gluconate Cloth  6 each Topical Q0600   cinacalcet  30 mg Oral Daily   clopidogrel  75 mg Oral Daily   docusate sodium  200 mg Oral BID   feeding supplement (NEPRO CARB STEADY)  237 mL Oral TID BM   insulin aspart  0-6 Units Subcutaneous TID WC   insulin glargine-yfgn  10 Units Subcutaneous QHS   multivitamin  1 tablet Oral QHS   polyethylene glycol  17 g Oral Daily   traZODone  50 mg  Oral QHS   zinc sulfate  220 mg Oral Daily   sodium chloride, albuterol, benzonatate, bisacodyl, calcium acetate, lidocaine-prilocaine, loratadine, melatonin, ondansetron (ZOFRAN) IV, ondansetron (ZOFRAN) IV, mouth rinse, oxyCODONE  Assessment/ Plan:  [name] is a 52 y.o.  male with past medical conditions including anemia, hypertension, diabetes, PVD, sleep apnea, vitamin D deficiency, and end-stage renal disease on hemodialysis.  Patient presents to the hospital for scheduled debridement of left lower venous ulcers.  Patient currently admitted for Gangrene of lower extremity ([place]) [I96]  [facility]S / EDW   End stage renal disease on hemodialysis. Continue TTS schedule  Receiving dialysis today, UF goal 2L as tolerated Will obtain standing weight after treatment and determine new dry weigh--176.0kg Next treatment scheduled for Saturday   2. Anemia of chronic kidney disease.  Lab Results  Component Value Date   HGB 9.5 (L) 11/25/2022    Patient receives Mircera at outpatient clinic.  Hemoglobin stable  3. Secondary Hyperparathyroidism: with outpatient labs: PTH 270 (3/12), phosphorus 6.0, calcium 9.2 .  Lab Results  Component Value Date   CALCIUM 9.1 11/25/2022   CAION 1.14 (L) 11/12/2022   PHOS 6.6 (H) 11/25/2022  Phosphorus slowly increasing. Continue calcium acetate with meals  4.  Hypertension with chronic kidney disease. Blood pressure at goal. Continue home regimen of amlodipine and atenolol.  Blood pressure 123/49 during dialysis     LOS: 15 [name] 3/28/202412:58 PM

## 2022-11-26 ENCOUNTER — Telehealth: Payer: Self-pay | Admitting: *Deleted

## 2022-11-26 ENCOUNTER — Other Ambulatory Visit: Payer: Self-pay | Admitting: Endocrinology

## 2022-11-26 ENCOUNTER — Other Ambulatory Visit: Payer: Self-pay | Admitting: *Deleted

## 2022-11-26 DIAGNOSIS — D352 Benign neoplasm of pituitary gland: Secondary | ICD-10-CM

## 2022-11-26 DIAGNOSIS — I96 Gangrene, not elsewhere classified: Secondary | ICD-10-CM

## 2022-11-26 NOTE — Progress Notes (Signed)
  Care Coordination   Note   11/26/2022 Name: DAHL LITTERIO MRN: [id number] DOB: [birth year]  Davin A Saggio is a 52 y.o. year old male who sees [name], Chrissie Noa, MD for primary care. I reached out to [employer] by phone today to offer care coordination services.  Mr. Hansley was given information about Care Coordination services today including:   The Care Coordination services include support from the care team which includes your Nurse Coordinator, Clinical Social Worker, or Pharmacist.  The Care Coordination team is here to help remove barriers to the health concerns and goals most important to you. Care Coordination services are voluntary, and the patient may decline or stop services at any time by request to their care team member.   Care Coordination Consent Status: Patient agreed to services and verbal consent obtained.   Follow up plan:  Telephone appointment with care coordination team member scheduled for:  12/03/2022  Encounter Outcome:  Pt. Scheduled from referral   Julian Hy, [place] Direct Dial: [phone]

## 2022-11-27 DIAGNOSIS — N186 End stage renal disease: Secondary | ICD-10-CM | POA: Diagnosis not present

## 2022-11-27 DIAGNOSIS — Z992 Dependence on renal dialysis: Secondary | ICD-10-CM | POA: Diagnosis not present

## 2022-11-27 DIAGNOSIS — N2581 Secondary hyperparathyroidism of renal origin: Secondary | ICD-10-CM | POA: Diagnosis not present

## 2022-11-28 DIAGNOSIS — N186 End stage renal disease: Secondary | ICD-10-CM | POA: Diagnosis not present

## 2022-11-28 DIAGNOSIS — Z992 Dependence on renal dialysis: Secondary | ICD-10-CM | POA: Diagnosis not present

## 2022-11-29 DIAGNOSIS — I872 Venous insufficiency (chronic) (peripheral): Secondary | ICD-10-CM | POA: Diagnosis not present

## 2022-11-29 DIAGNOSIS — E1152 Type 2 diabetes mellitus with diabetic peripheral angiopathy with gangrene: Secondary | ICD-10-CM | POA: Diagnosis not present

## 2022-11-29 DIAGNOSIS — J45909 Unspecified asthma, uncomplicated: Secondary | ICD-10-CM | POA: Diagnosis not present

## 2022-11-29 DIAGNOSIS — I499 Cardiac arrhythmia, unspecified: Secondary | ICD-10-CM | POA: Diagnosis not present

## 2022-11-29 DIAGNOSIS — I120 Hypertensive chronic kidney disease with stage 5 chronic kidney disease or end stage renal disease: Secondary | ICD-10-CM | POA: Diagnosis not present

## 2022-11-29 DIAGNOSIS — N2581 Secondary hyperparathyroidism of renal origin: Secondary | ICD-10-CM | POA: Diagnosis not present

## 2022-11-29 DIAGNOSIS — I890 Lymphedema, not elsewhere classified: Secondary | ICD-10-CM | POA: Diagnosis not present

## 2022-11-29 DIAGNOSIS — Z992 Dependence on renal dialysis: Secondary | ICD-10-CM | POA: Diagnosis not present

## 2022-11-29 DIAGNOSIS — N186 End stage renal disease: Secondary | ICD-10-CM | POA: Diagnosis not present

## 2022-11-29 DIAGNOSIS — E1165 Type 2 diabetes mellitus with hyperglycemia: Secondary | ICD-10-CM | POA: Diagnosis not present

## 2022-11-29 DIAGNOSIS — E1122 Type 2 diabetes mellitus with diabetic chronic kidney disease: Secondary | ICD-10-CM | POA: Diagnosis not present

## 2022-11-30 DIAGNOSIS — N2581 Secondary hyperparathyroidism of renal origin: Secondary | ICD-10-CM | POA: Diagnosis not present

## 2022-11-30 DIAGNOSIS — N186 End stage renal disease: Secondary | ICD-10-CM | POA: Diagnosis not present

## 2022-11-30 DIAGNOSIS — Z992 Dependence on renal dialysis: Secondary | ICD-10-CM | POA: Diagnosis not present

## 2022-12-01 ENCOUNTER — Encounter (INDEPENDENT_AMBULATORY_CARE_PROVIDER_SITE_OTHER): Payer: Self-pay

## 2022-12-01 ENCOUNTER — Ambulatory Visit (INDEPENDENT_AMBULATORY_CARE_PROVIDER_SITE_OTHER): Payer: Medicare HMO | Admitting: Nurse Practitioner

## 2022-12-01 ENCOUNTER — Ambulatory Visit
Admission: RE | Admit: 2022-12-01 | Discharge: 2022-12-01 | Disposition: A | Discharge status: Home / Self Care | Payer: Medicare HMO | Source: Ambulatory Visit | Attending: Endocrinology | Admitting: Endocrinology

## 2022-12-01 VITALS — BP 166/84 | HR 54 | Resp 20 | Ht 76.0 in | Wt 397.0 lb

## 2022-12-01 DIAGNOSIS — L97329 Non-pressure chronic ulcer of left ankle with unspecified severity: Secondary | ICD-10-CM

## 2022-12-01 DIAGNOSIS — D352 Benign neoplasm of pituitary gland: Secondary | ICD-10-CM | POA: Insufficient documentation

## 2022-12-01 DIAGNOSIS — Z0389 Encounter for observation for other suspected diseases and conditions ruled out: Secondary | ICD-10-CM | POA: Diagnosis not present

## 2022-12-01 DIAGNOSIS — I83023 Varicose veins of left lower extremity with ulcer of ankle: Secondary | ICD-10-CM

## 2022-12-01 MED ORDER — GADOBUTROL 1 MMOL/ML IV SOLN
7.5000 mL | Freq: Once | INTRAVENOUS | Status: AC | PRN
  Administered 2022-12-01: 7.5 mL via INTRAVENOUS

## 2022-12-02 DIAGNOSIS — N186 End stage renal disease: Secondary | ICD-10-CM | POA: Diagnosis not present

## 2022-12-02 DIAGNOSIS — N2581 Secondary hyperparathyroidism of renal origin: Secondary | ICD-10-CM | POA: Diagnosis not present

## 2022-12-02 DIAGNOSIS — Z992 Dependence on renal dialysis: Secondary | ICD-10-CM | POA: Diagnosis not present

## 2022-12-03 ENCOUNTER — Ambulatory Visit: Payer: Self-pay | Admitting: *Deleted

## 2022-12-03 ENCOUNTER — Encounter: Payer: Self-pay | Admitting: *Deleted

## 2022-12-03 DIAGNOSIS — I120 Hypertensive chronic kidney disease with stage 5 chronic kidney disease or end stage renal disease: Secondary | ICD-10-CM | POA: Diagnosis not present

## 2022-12-03 DIAGNOSIS — J45909 Unspecified asthma, uncomplicated: Secondary | ICD-10-CM | POA: Diagnosis not present

## 2022-12-03 DIAGNOSIS — I499 Cardiac arrhythmia, unspecified: Secondary | ICD-10-CM | POA: Diagnosis not present

## 2022-12-03 DIAGNOSIS — I890 Lymphedema, not elsewhere classified: Secondary | ICD-10-CM | POA: Diagnosis not present

## 2022-12-03 DIAGNOSIS — N186 End stage renal disease: Secondary | ICD-10-CM | POA: Diagnosis not present

## 2022-12-03 DIAGNOSIS — E1122 Type 2 diabetes mellitus with diabetic chronic kidney disease: Secondary | ICD-10-CM | POA: Diagnosis not present

## 2022-12-03 DIAGNOSIS — I872 Venous insufficiency (chronic) (peripheral): Secondary | ICD-10-CM | POA: Diagnosis not present

## 2022-12-03 DIAGNOSIS — E1165 Type 2 diabetes mellitus with hyperglycemia: Secondary | ICD-10-CM | POA: Diagnosis not present

## 2022-12-03 DIAGNOSIS — E1152 Type 2 diabetes mellitus with diabetic peripheral angiopathy with gangrene: Secondary | ICD-10-CM | POA: Diagnosis not present

## 2022-12-03 NOTE — Patient Outreach (Signed)
  Care Coordination   Initial Visit Note   12/03/2022 Name: [name] MRN: [id number] DOB: [birth year]  [name] is a 52 y.o. year old male who sees [name], [name], MD for primary care. I spoke with  [name] by phone today.  What matters to the patients health and wellness today?  Recently hospitalized for gangrene of foot, now on antibiotics and receiving wound care 3 times a week.  Eager to have wound healed.     Goals Addressed             This Visit's Progress    Effective management of DM       Care Coordination Interventions: Provided education to patient about basic DM disease process Reviewed medications with patient and discussed importance of medication adherence Counseled on importance of regular laboratory monitoring as prescribed Reviewed scheduled/upcoming provider appointments including: PCP on 4/24 and endocrinology on 6/24 Advised patient, providing education and rationale, to check cbg daily and record, calling provider for findings outside established parameters Admits he is not taking blood sugars, aware of importance of taking.  Will place Dexcom back on to start readings. Discussed diabetic diet, state he knows what he's supposed to eat, but he usually eat what he wants, just in moderation and considers portions Report he is adherent to HD on Tuesdays, Thursdays, and Saturdays Discussed current A1C of 8.8, goal of less than 7      Effective wound healing of leg wound       Care Coordination Interventions: Evaluation of current treatment plan related to leg wound and patient's adherence to plan as established by provider Advised patient to call [facility] agency to discuss concerns for treatment (regarding availability of supplies) Reviewed scheduled/upcoming provider appointments including HH wound care nursing visit on Mondays and Fridays, vascular visits every Wednesday Discussed plans with patient for ongoing care management follow up and provided  patient with direct contact information for care management team Screening for signs and symptoms of depression related to chronic disease state  Assessed social determinant of health barriers         SDOH assessments and interventions completed:  Yes  SDOH Interventions Today    Flowsheet Row Most Recent Value  SDOH Interventions   Food Insecurity Interventions Intervention Not Indicated  Housing Interventions Intervention Not Indicated  Transportation Interventions Intervention Not Indicated  Utilities Interventions Intervention Not Indicated        Care Coordination Interventions:  Yes, provided   Interventions Today    Flowsheet Row Most Recent Value  Chronic Disease   Chronic disease during today's visit Diabetes  General Interventions   General Interventions Discussed/Reviewed General Interventions Reviewed, Annual Foot Exam, Labs, Doctor Visits  Labs Hgb A1c every 6 months  Doctor Visits Discussed/Reviewed Doctor Visits Reviewed, PCP, Specialist  PCP/Specialist Visits Compliance with follow-up visit  Education Interventions   Education Provided Provided Education  Provided Verbal Education On Nutrition, Foot Care, Blood Sugar Monitoring, When to see the doctor       Follow up plan: Follow up call scheduled for 4/19    Encounter Outcome:  Pt. Visit Completed   Kemper Durie, RN, MSN, [facility] Care Management Care Management Coordinator [phone]

## 2022-12-03 NOTE — Patient Instructions (Signed)
Visit Information  Thank you for taking time to visit with me today. Please don't hesitate to contact me if I can be of assistance to you before our next scheduled telephone appointment.  Following are the goals we discussed today:  Restart checking blood sugar at least daily. Monitor diet, eating foods low in carbs and sugars.   Our next appointment is by telephone on 4/19  Please call the care guide team at [phone] if you need to cancel or reschedule your appointment.   Please call the Suicide and Crisis Lifeline: 988 call the [place] National Suicide Prevention Lifeline: (424)842-3459 or TTY: (609)463-4514 TTY (302)849-7138) to talk to a trained counselor call 1-800-273-TALK (toll free, 24 hour hotline) call 911 if you are experiencing a Mental Health or Behavioral Health Crisis or need someone to talk to.  The patient verbalized understanding of instructions, educational materials, and care plan provided today and agreed to receive a mailed copy of patient instructions, educational materials, and care plan.   The patient has been provided with contact information for the care management team and has been advised to call with any health related questions or concerns.   Kemper Durie, RN, MSN, [facility] Care Management Care Management Coordinator [phone]

## 2022-12-04 DIAGNOSIS — N2581 Secondary hyperparathyroidism of renal origin: Secondary | ICD-10-CM | POA: Diagnosis not present

## 2022-12-04 DIAGNOSIS — Z992 Dependence on renal dialysis: Secondary | ICD-10-CM | POA: Diagnosis not present

## 2022-12-04 DIAGNOSIS — N186 End stage renal disease: Secondary | ICD-10-CM | POA: Diagnosis not present

## 2022-12-06 DIAGNOSIS — I120 Hypertensive chronic kidney disease with stage 5 chronic kidney disease or end stage renal disease: Secondary | ICD-10-CM | POA: Diagnosis not present

## 2022-12-06 DIAGNOSIS — I499 Cardiac arrhythmia, unspecified: Secondary | ICD-10-CM | POA: Diagnosis not present

## 2022-12-06 DIAGNOSIS — N186 End stage renal disease: Secondary | ICD-10-CM | POA: Diagnosis not present

## 2022-12-06 DIAGNOSIS — I872 Venous insufficiency (chronic) (peripheral): Secondary | ICD-10-CM | POA: Diagnosis not present

## 2022-12-06 DIAGNOSIS — E1165 Type 2 diabetes mellitus with hyperglycemia: Secondary | ICD-10-CM | POA: Diagnosis not present

## 2022-12-06 DIAGNOSIS — J45909 Unspecified asthma, uncomplicated: Secondary | ICD-10-CM | POA: Diagnosis not present

## 2022-12-06 DIAGNOSIS — E1152 Type 2 diabetes mellitus with diabetic peripheral angiopathy with gangrene: Secondary | ICD-10-CM | POA: Diagnosis not present

## 2022-12-06 DIAGNOSIS — E1122 Type 2 diabetes mellitus with diabetic chronic kidney disease: Secondary | ICD-10-CM | POA: Diagnosis not present

## 2022-12-06 DIAGNOSIS — I890 Lymphedema, not elsewhere classified: Secondary | ICD-10-CM | POA: Diagnosis not present

## 2022-12-07 DIAGNOSIS — N2581 Secondary hyperparathyroidism of renal origin: Secondary | ICD-10-CM | POA: Diagnosis not present

## 2022-12-07 DIAGNOSIS — Z992 Dependence on renal dialysis: Secondary | ICD-10-CM | POA: Diagnosis not present

## 2022-12-07 DIAGNOSIS — N186 End stage renal disease: Secondary | ICD-10-CM | POA: Diagnosis not present

## 2022-12-08 ENCOUNTER — Ambulatory Visit (INDEPENDENT_AMBULATORY_CARE_PROVIDER_SITE_OTHER): Payer: Medicare HMO | Admitting: Nurse Practitioner

## 2022-12-08 ENCOUNTER — Encounter (INDEPENDENT_AMBULATORY_CARE_PROVIDER_SITE_OTHER): Payer: Self-pay | Admitting: Nurse Practitioner

## 2022-12-08 VITALS — BP 172/77 | HR 53 | Resp 18

## 2022-12-08 DIAGNOSIS — I83023 Varicose veins of left lower extremity with ulcer of ankle: Secondary | ICD-10-CM

## 2022-12-08 DIAGNOSIS — L97329 Non-pressure chronic ulcer of left ankle with unspecified severity: Secondary | ICD-10-CM

## 2022-12-08 NOTE — Progress Notes (Signed)
Patient came in for wound dressing change. Cellerate powder was placed in the wound as advised by Sheppard Plumber, NP followed by Adaptic , Aquacel, abd, Kerlix, ace bandage. Patient will continue to treated by home health every week.

## 2022-12-09 DIAGNOSIS — Z992 Dependence on renal dialysis: Secondary | ICD-10-CM | POA: Diagnosis not present

## 2022-12-09 DIAGNOSIS — N186 End stage renal disease: Secondary | ICD-10-CM | POA: Diagnosis not present

## 2022-12-09 DIAGNOSIS — N2581 Secondary hyperparathyroidism of renal origin: Secondary | ICD-10-CM | POA: Diagnosis not present

## 2022-12-11 DIAGNOSIS — I120 Hypertensive chronic kidney disease with stage 5 chronic kidney disease or end stage renal disease: Secondary | ICD-10-CM | POA: Diagnosis not present

## 2022-12-11 DIAGNOSIS — I872 Venous insufficiency (chronic) (peripheral): Secondary | ICD-10-CM | POA: Diagnosis not present

## 2022-12-11 DIAGNOSIS — J45909 Unspecified asthma, uncomplicated: Secondary | ICD-10-CM | POA: Diagnosis not present

## 2022-12-11 DIAGNOSIS — I499 Cardiac arrhythmia, unspecified: Secondary | ICD-10-CM | POA: Diagnosis not present

## 2022-12-11 DIAGNOSIS — E1165 Type 2 diabetes mellitus with hyperglycemia: Secondary | ICD-10-CM | POA: Diagnosis not present

## 2022-12-11 DIAGNOSIS — N186 End stage renal disease: Secondary | ICD-10-CM | POA: Diagnosis not present

## 2022-12-11 DIAGNOSIS — Z992 Dependence on renal dialysis: Secondary | ICD-10-CM | POA: Diagnosis not present

## 2022-12-11 DIAGNOSIS — E1152 Type 2 diabetes mellitus with diabetic peripheral angiopathy with gangrene: Secondary | ICD-10-CM | POA: Diagnosis not present

## 2022-12-11 DIAGNOSIS — E1122 Type 2 diabetes mellitus with diabetic chronic kidney disease: Secondary | ICD-10-CM | POA: Diagnosis not present

## 2022-12-11 DIAGNOSIS — I890 Lymphedema, not elsewhere classified: Secondary | ICD-10-CM | POA: Diagnosis not present

## 2022-12-11 DIAGNOSIS — N2581 Secondary hyperparathyroidism of renal origin: Secondary | ICD-10-CM | POA: Diagnosis not present

## 2022-12-14 DIAGNOSIS — Z992 Dependence on renal dialysis: Secondary | ICD-10-CM | POA: Diagnosis not present

## 2022-12-14 DIAGNOSIS — I890 Lymphedema, not elsewhere classified: Secondary | ICD-10-CM | POA: Diagnosis not present

## 2022-12-14 DIAGNOSIS — E1122 Type 2 diabetes mellitus with diabetic chronic kidney disease: Secondary | ICD-10-CM | POA: Diagnosis not present

## 2022-12-14 DIAGNOSIS — N2581 Secondary hyperparathyroidism of renal origin: Secondary | ICD-10-CM | POA: Diagnosis not present

## 2022-12-14 DIAGNOSIS — E1152 Type 2 diabetes mellitus with diabetic peripheral angiopathy with gangrene: Secondary | ICD-10-CM | POA: Diagnosis not present

## 2022-12-14 DIAGNOSIS — I499 Cardiac arrhythmia, unspecified: Secondary | ICD-10-CM | POA: Diagnosis not present

## 2022-12-14 DIAGNOSIS — I872 Venous insufficiency (chronic) (peripheral): Secondary | ICD-10-CM | POA: Diagnosis not present

## 2022-12-14 DIAGNOSIS — E1165 Type 2 diabetes mellitus with hyperglycemia: Secondary | ICD-10-CM | POA: Diagnosis not present

## 2022-12-14 DIAGNOSIS — I120 Hypertensive chronic kidney disease with stage 5 chronic kidney disease or end stage renal disease: Secondary | ICD-10-CM | POA: Diagnosis not present

## 2022-12-14 DIAGNOSIS — J45909 Unspecified asthma, uncomplicated: Secondary | ICD-10-CM | POA: Diagnosis not present

## 2022-12-14 DIAGNOSIS — N186 End stage renal disease: Secondary | ICD-10-CM | POA: Diagnosis not present

## 2022-12-15 ENCOUNTER — Encounter (INDEPENDENT_AMBULATORY_CARE_PROVIDER_SITE_OTHER): Payer: Self-pay

## 2022-12-15 ENCOUNTER — Ambulatory Visit (INDEPENDENT_AMBULATORY_CARE_PROVIDER_SITE_OTHER): Payer: Medicare HMO | Admitting: Nurse Practitioner

## 2022-12-15 VITALS — BP 166/89 | HR 89 | Resp 18

## 2022-12-15 DIAGNOSIS — I83023 Varicose veins of left lower extremity with ulcer of ankle: Secondary | ICD-10-CM

## 2022-12-15 DIAGNOSIS — L97329 Non-pressure chronic ulcer of left ankle with unspecified severity: Secondary | ICD-10-CM

## 2022-12-15 NOTE — Progress Notes (Unsigned)
Patient came in for wound dressing change. Cellerate powder was placed in the wound as advised by [name] Brown, NP followed by Adaptic , Aquacel, abd, Kerlix, ace bandage. Patient will continue to treated by home health every week.  

## 2022-12-16 ENCOUNTER — Encounter (INDEPENDENT_AMBULATORY_CARE_PROVIDER_SITE_OTHER): Payer: Self-pay | Admitting: Nurse Practitioner

## 2022-12-16 DIAGNOSIS — N186 End stage renal disease: Secondary | ICD-10-CM | POA: Diagnosis not present

## 2022-12-16 DIAGNOSIS — Z992 Dependence on renal dialysis: Secondary | ICD-10-CM | POA: Diagnosis not present

## 2022-12-16 DIAGNOSIS — N2581 Secondary hyperparathyroidism of renal origin: Secondary | ICD-10-CM | POA: Diagnosis not present

## 2022-12-17 ENCOUNTER — Ambulatory Visit: Payer: Self-pay | Admitting: *Deleted

## 2022-12-17 DIAGNOSIS — E1165 Type 2 diabetes mellitus with hyperglycemia: Secondary | ICD-10-CM | POA: Diagnosis not present

## 2022-12-17 DIAGNOSIS — I120 Hypertensive chronic kidney disease with stage 5 chronic kidney disease or end stage renal disease: Secondary | ICD-10-CM | POA: Diagnosis not present

## 2022-12-17 DIAGNOSIS — I890 Lymphedema, not elsewhere classified: Secondary | ICD-10-CM | POA: Diagnosis not present

## 2022-12-17 DIAGNOSIS — J45909 Unspecified asthma, uncomplicated: Secondary | ICD-10-CM | POA: Diagnosis not present

## 2022-12-17 DIAGNOSIS — I872 Venous insufficiency (chronic) (peripheral): Secondary | ICD-10-CM | POA: Diagnosis not present

## 2022-12-17 DIAGNOSIS — I499 Cardiac arrhythmia, unspecified: Secondary | ICD-10-CM | POA: Diagnosis not present

## 2022-12-17 DIAGNOSIS — E1152 Type 2 diabetes mellitus with diabetic peripheral angiopathy with gangrene: Secondary | ICD-10-CM | POA: Diagnosis not present

## 2022-12-17 DIAGNOSIS — E1122 Type 2 diabetes mellitus with diabetic chronic kidney disease: Secondary | ICD-10-CM | POA: Diagnosis not present

## 2022-12-17 DIAGNOSIS — N186 End stage renal disease: Secondary | ICD-10-CM | POA: Diagnosis not present

## 2022-12-17 NOTE — Patient Outreach (Signed)
  Care Coordination   Follow Up Visit Note   12/17/2022 Name: [name] MRN: [id number] DOB: [birth year]  [name] is a 52 y.o. year old male who sees [name], [name] Guthrie, MD for primary care. I spoke with  [name] by phone today.  What matters to the patients health and wellness today?  Ongoing DM management for good wound healing.    Goals Addressed             This Visit's Progress    Effective management of DM   On track    Care Coordination Interventions: Provided education to patient about basic DM disease process Reviewed medications with patient and discussed importance of medication adherence Counseled on importance of regular laboratory monitoring as prescribed Reviewed scheduled/upcoming provider appointments including: PCP on 4/24 and endocrinology on 6/24 Advised patient, providing education and rationale, to check cbg daily and record, calling provider for findings outside established parameters Placed Dexcom back on to started readings. Discussed diabetic diet, state he knows what he's supposed to eat, but he usually eat what he wants, just in moderation and considers portions Report he is adherent to HD on Tuesdays, Thursdays, and Saturdays Discussed current A1C of 8.8, goal of less than 7      Effective wound healing of leg wound   On track    Care Coordination Interventions: Evaluation of current treatment plan related to leg wound and patient's adherence to plan as established by provider Advised patient to call [name] agency to discuss concerns for treatment (regarding availability of supplies) Reviewed scheduled/upcoming provider appointments including HH wound care nursing visit on Mondays and Fridays, vascular visits every Wednesday Discussed plans with patient for ongoing care management follow up and provided patient with direct contact information for care management team Screening for signs and symptoms of depression related to chronic disease  state  Assessed social determinant of health barriers         SDOH assessments and interventions completed:  No     Care Coordination Interventions:  Yes, provided   Interventions Today    Flowsheet Row Most Recent Value  Chronic Disease   Chronic disease during today's visit Diabetes, Other  [leg wound]  General Interventions   General Interventions Discussed/Reviewed General Interventions Reviewed, Annual Eye Exam, Doctor Visits  Doctor Visits Discussed/Reviewed Doctor Visits Reviewed, Specialist, PCP  [facility] and eye doctor scheduled for 4/24, vascular on 4/29.  Has visit with [facility] for dressing change every Wednesday]  PCP/Specialist Visits Compliance with follow-up visit  Education Interventions   Education Provided Provided Education  Provided Verbal Education On When to see the doctor, Other, Blood Sugar Monitoring, Nutrition  [name] visits every Monday and Wednesday, blood sugar range 130-260.]  Nutrition Interventions   Nutrition Discussed/Reviewed Nutrition Reviewed, Adding fruits and vegetables, Increaing proteins, Decreasing sugar intake       Follow up plan: Follow up call scheduled for 5/10    Encounter Outcome:  Pt. Visit Completed   [name] Durie, RN,MSN, [facility] [name] Care Management Care Management Coordinator [phone]

## 2022-12-18 DIAGNOSIS — N186 End stage renal disease: Secondary | ICD-10-CM | POA: Diagnosis not present

## 2022-12-18 DIAGNOSIS — N2581 Secondary hyperparathyroidism of renal origin: Secondary | ICD-10-CM | POA: Diagnosis not present

## 2022-12-18 DIAGNOSIS — Z992 Dependence on renal dialysis: Secondary | ICD-10-CM | POA: Diagnosis not present

## 2022-12-20 ENCOUNTER — Encounter (INDEPENDENT_AMBULATORY_CARE_PROVIDER_SITE_OTHER): Payer: Self-pay | Admitting: Nurse Practitioner

## 2022-12-20 DIAGNOSIS — E1165 Type 2 diabetes mellitus with hyperglycemia: Secondary | ICD-10-CM | POA: Diagnosis not present

## 2022-12-20 DIAGNOSIS — E1122 Type 2 diabetes mellitus with diabetic chronic kidney disease: Secondary | ICD-10-CM | POA: Diagnosis not present

## 2022-12-20 DIAGNOSIS — N186 End stage renal disease: Secondary | ICD-10-CM | POA: Diagnosis not present

## 2022-12-20 DIAGNOSIS — I890 Lymphedema, not elsewhere classified: Secondary | ICD-10-CM | POA: Diagnosis not present

## 2022-12-20 DIAGNOSIS — I499 Cardiac arrhythmia, unspecified: Secondary | ICD-10-CM | POA: Diagnosis not present

## 2022-12-20 DIAGNOSIS — E1152 Type 2 diabetes mellitus with diabetic peripheral angiopathy with gangrene: Secondary | ICD-10-CM | POA: Diagnosis not present

## 2022-12-20 DIAGNOSIS — I120 Hypertensive chronic kidney disease with stage 5 chronic kidney disease or end stage renal disease: Secondary | ICD-10-CM | POA: Diagnosis not present

## 2022-12-20 DIAGNOSIS — J45909 Unspecified asthma, uncomplicated: Secondary | ICD-10-CM | POA: Diagnosis not present

## 2022-12-20 DIAGNOSIS — I872 Venous insufficiency (chronic) (peripheral): Secondary | ICD-10-CM | POA: Diagnosis not present

## 2022-12-20 NOTE — Progress Notes (Signed)
Patient wounds cover with good granulating tissue.  He tolerated the wound changes well.  Silvadene was applied and the wound bed is covered with Adaptic.  Aquacel placed over Adaptic with ABD Kerlix and Ace dressing.  Patient will continue to have the outer layers changed by home health on Mondays and Fridays and we will change the full dressing including reapplication of Cellerate on Wednesdays.

## 2022-12-21 DIAGNOSIS — Z794 Long term (current) use of insulin: Secondary | ICD-10-CM | POA: Diagnosis not present

## 2022-12-21 DIAGNOSIS — Z79899 Other long term (current) drug therapy: Secondary | ICD-10-CM | POA: Diagnosis not present

## 2022-12-21 DIAGNOSIS — N2581 Secondary hyperparathyroidism of renal origin: Secondary | ICD-10-CM | POA: Diagnosis not present

## 2022-12-21 DIAGNOSIS — N186 End stage renal disease: Secondary | ICD-10-CM | POA: Diagnosis not present

## 2022-12-21 DIAGNOSIS — Z992 Dependence on renal dialysis: Secondary | ICD-10-CM | POA: Diagnosis not present

## 2022-12-21 DIAGNOSIS — E119 Type 2 diabetes mellitus without complications: Secondary | ICD-10-CM | POA: Diagnosis not present

## 2022-12-22 ENCOUNTER — Ambulatory Visit (INDEPENDENT_AMBULATORY_CARE_PROVIDER_SITE_OTHER): Payer: Medicare HMO | Admitting: Nurse Practitioner

## 2022-12-22 ENCOUNTER — Encounter (INDEPENDENT_AMBULATORY_CARE_PROVIDER_SITE_OTHER): Payer: Self-pay | Admitting: Nurse Practitioner

## 2022-12-22 VITALS — BP 164/81 | HR 64 | Resp 18

## 2022-12-22 DIAGNOSIS — I120 Hypertensive chronic kidney disease with stage 5 chronic kidney disease or end stage renal disease: Secondary | ICD-10-CM | POA: Diagnosis not present

## 2022-12-22 DIAGNOSIS — L97329 Non-pressure chronic ulcer of left ankle with unspecified severity: Secondary | ICD-10-CM

## 2022-12-22 DIAGNOSIS — I83023 Varicose veins of left lower extremity with ulcer of ankle: Secondary | ICD-10-CM

## 2022-12-22 DIAGNOSIS — E785 Hyperlipidemia, unspecified: Secondary | ICD-10-CM | POA: Diagnosis not present

## 2022-12-22 DIAGNOSIS — Z961 Presence of intraocular lens: Secondary | ICD-10-CM | POA: Diagnosis not present

## 2022-12-22 DIAGNOSIS — E113593 Type 2 diabetes mellitus with proliferative diabetic retinopathy without macular edema, bilateral: Secondary | ICD-10-CM | POA: Diagnosis not present

## 2022-12-22 DIAGNOSIS — E1122 Type 2 diabetes mellitus with diabetic chronic kidney disease: Secondary | ICD-10-CM | POA: Diagnosis not present

## 2022-12-22 DIAGNOSIS — E113592 Type 2 diabetes mellitus with proliferative diabetic retinopathy without macular edema, left eye: Secondary | ICD-10-CM | POA: Diagnosis not present

## 2022-12-22 DIAGNOSIS — Z794 Long term (current) use of insulin: Secondary | ICD-10-CM | POA: Diagnosis not present

## 2022-12-22 DIAGNOSIS — E221 Hyperprolactinemia: Secondary | ICD-10-CM | POA: Diagnosis not present

## 2022-12-22 DIAGNOSIS — H4312 Vitreous hemorrhage, left eye: Secondary | ICD-10-CM | POA: Diagnosis not present

## 2022-12-22 DIAGNOSIS — Z0000 Encounter for general adult medical examination without abnormal findings: Secondary | ICD-10-CM | POA: Diagnosis not present

## 2022-12-22 DIAGNOSIS — E1151 Type 2 diabetes mellitus with diabetic peripheral angiopathy without gangrene: Secondary | ICD-10-CM | POA: Diagnosis not present

## 2022-12-22 DIAGNOSIS — N186 End stage renal disease: Secondary | ICD-10-CM | POA: Diagnosis not present

## 2022-12-22 NOTE — Progress Notes (Unsigned)
Patient came in for wound dressing change. Cellerate powder was placed in the wound as advised by Fallon Brown, NP followed by Adaptic , Aquacel, abd, Kerlix, ace bandage. Patient will continue to treated by home health every week.  

## 2022-12-23 ENCOUNTER — Encounter (INDEPENDENT_AMBULATORY_CARE_PROVIDER_SITE_OTHER): Payer: Self-pay | Admitting: Nurse Practitioner

## 2022-12-23 DIAGNOSIS — N186 End stage renal disease: Secondary | ICD-10-CM | POA: Diagnosis not present

## 2022-12-23 DIAGNOSIS — Z992 Dependence on renal dialysis: Secondary | ICD-10-CM | POA: Diagnosis not present

## 2022-12-23 DIAGNOSIS — N2581 Secondary hyperparathyroidism of renal origin: Secondary | ICD-10-CM | POA: Diagnosis not present

## 2022-12-24 DIAGNOSIS — E1122 Type 2 diabetes mellitus with diabetic chronic kidney disease: Secondary | ICD-10-CM | POA: Diagnosis not present

## 2022-12-24 DIAGNOSIS — J45909 Unspecified asthma, uncomplicated: Secondary | ICD-10-CM | POA: Diagnosis not present

## 2022-12-24 DIAGNOSIS — I890 Lymphedema, not elsewhere classified: Secondary | ICD-10-CM | POA: Diagnosis not present

## 2022-12-24 DIAGNOSIS — E1152 Type 2 diabetes mellitus with diabetic peripheral angiopathy with gangrene: Secondary | ICD-10-CM | POA: Diagnosis not present

## 2022-12-24 DIAGNOSIS — I499 Cardiac arrhythmia, unspecified: Secondary | ICD-10-CM | POA: Diagnosis not present

## 2022-12-24 DIAGNOSIS — N186 End stage renal disease: Secondary | ICD-10-CM | POA: Diagnosis not present

## 2022-12-24 DIAGNOSIS — I120 Hypertensive chronic kidney disease with stage 5 chronic kidney disease or end stage renal disease: Secondary | ICD-10-CM | POA: Diagnosis not present

## 2022-12-24 DIAGNOSIS — I872 Venous insufficiency (chronic) (peripheral): Secondary | ICD-10-CM | POA: Diagnosis not present

## 2022-12-24 DIAGNOSIS — E1165 Type 2 diabetes mellitus with hyperglycemia: Secondary | ICD-10-CM | POA: Diagnosis not present

## 2022-12-25 DIAGNOSIS — N186 End stage renal disease: Secondary | ICD-10-CM | POA: Diagnosis not present

## 2022-12-25 DIAGNOSIS — N2581 Secondary hyperparathyroidism of renal origin: Secondary | ICD-10-CM | POA: Diagnosis not present

## 2022-12-25 DIAGNOSIS — Z992 Dependence on renal dialysis: Secondary | ICD-10-CM | POA: Diagnosis not present

## 2022-12-26 NOTE — Progress Notes (Signed)
MRN : [id number]  [name] is a 52 y.o. (09-07-1970) male who presents with chief complaint of legs hurt and swell.  History of Present Illness:   The patient returns to the office for followup of their dialysis access.   The patient reports the function of the access has been stable. Patient denies difficulty with cannulation. The patient denies increased bleeding time after removing the needles. The patient denies hand pain or other symptoms consistent with steal phenomena.  No significant arm swelling.  The patient denies any complaints from the dialysis center or their nephrologist.  The patient denies redness or swelling at the access site. The patient denies fever or chills at home or while on dialysis.  The patient continues to treat his left ankle ulcer with wraps.  Wound care is visiting.  He denies pain.  He notes there is still significant drainage.  The patient denies amaurosis fugax or recent TIA symptoms. There are no recent neurological changes noted. There is no history of DVT, PE or superficial thrombophlebitis. No recent episodes of angina or shortness of breath documented.   Duplex ultrasound of the AV access shows a patent access.  The previously noted stenosis is not significantly changed compared to last study.  Flow volume today is 1312 cc/min (previous flow volume was 990 cc/min)    No outpatient medications have been marked as taking for the 12/27/22 encounter (Appointment) with Gilda Crease, Latina Craver, MD.    Past Medical History:  Diagnosis Date   Anemia    Chronic kidney disease    14% FUNCTION   Diabetes mellitus without complication (HCC)    Diabetic retinopathy (HCC)    Dyspnea    DOE   Dysrhythmia    End stage renal disease (HCC)    History of orthopnea    error wrong patietn   Hypercholesteremia    error   Hyperkalemia    error wrong patient   Hypertension    Long-term insulin use (HCC)    Lymphedema of leg    Metabolic  encephalopathy    error   Microalbuminuria    MSSA (methicillin susceptible Staphylococcus aureus)    Obesity    PVD (peripheral vascular disease) (HCC)    Respiratory failure (HCC)    Error   Sepsis (HCC)    error   Sleep apnea    no CPAP   UTI (urinary tract infection)    error   Venous ulcer (HCC)    Vitamin D deficiency     Past Surgical History:  Procedure Laterality Date   A/V FISTULAGRAM Right 10/24/2018   Procedure: A/V FISTULAGRAM;  Surgeon: Renford Dills, MD;  Location: [facility];  Service: Cardiovascular;  Laterality: Right;   A/V FISTULAGRAM Right 11/24/2018   Procedure: A/V FISTULAGRAM;  Surgeon: Renford Dills, MD;  Location: [facility];  Service: Cardiovascular;  Laterality: Right;   A/V FISTULAGRAM Left 07/31/2019   Procedure: A/V FISTULAGRAM;  Surgeon: Renford Dills, MD;  Location: [facility];  Service: Cardiovascular;  Laterality: Left;   A/V FISTULAGRAM Left 12/11/2019   Procedure: A/V FISTULAGRAM;  Surgeon: Renford Dills, MD;  Location: [facility];  Service: Cardiovascular;  Laterality: Left;   A/V SHUNT INTERVENTION Right 02/21/2019   Procedure: A/V SHUNT INTERVENTION;  Surgeon: Renford Dills, MD;  Location: [facility];  Service: Cardiovascular;  Laterality: Right;   APPLICATION OF  WOUND VAC Left 10/03/2017   Procedure: APPLICATION OF WOUND VAC;  Surgeon: Annice Needy, MD;  Location: [facility];  Service: General;  Laterality: Left;   APPLICATION OF WOUND VAC Left 10/11/2017   Procedure: APPLICATION OF WOUND VAC;  Surgeon: Renford Dills, MD;  Location: [facility];  Service: Vascular;  Laterality: Left;   APPLICATION OF WOUND VAC Left 10/14/2017   Procedure: WOUND VAC CHANGE;  Surgeon: Renford Dills, MD;  Location: [facility];  Service: Vascular;  Laterality: Left;  left lower leg   APPLICATION OF WOUND VAC Left 10/18/2017   Procedure: WOUND VAC CHANGE;  Surgeon: Renford Dills, MD;   Location: [facility];  Service: Vascular;  Laterality: Left;   APPLICATION OF WOUND VAC Left 10/07/2017   Procedure: APPLICATION OF WOUND VAC;  Surgeon: Renford Dills, MD;  Location: [facility];  Service: Vascular;  Laterality: Left;   APPLICATION OF WOUND VAC Left 11/17/2022   Procedure: Wound vac exchange;  Surgeon: Renford Dills, MD;  Location: [facility];  Service: Vascular;  Laterality: Left;   APPLICATION OF WOUND VAC Left 11/10/2022   Procedure: APPLICATION OF WOUND VAC;  Surgeon: Renford Dills, MD;  Location: [facility];  Service: Vascular;  Laterality: Left;   APPLICATION OF WOUND VAC Left 11/24/2022   Procedure: WOUND VAC REMOVAL;  Surgeon: Renford Dills, MD;  Location: [facility];  Service: Vascular;  Laterality: Left;   APPLICATION OF WOUND VAC Left 11/12/2022   Procedure: APPLICATION OF WOUND VAC;  Surgeon: Renford Dills, MD;  Location: [facility];  Service: Vascular;  Laterality: Left;   AV FISTULA INSERTION W/ RF MAGNETIC GUIDANCE Right 07/19/2018   Procedure: AV FISTULA INSERTION W/RF MAGNETIC GUIDANCE;  Surgeon: Renford Dills, MD;  Location: [facility];  Service: Cardiovascular;  Laterality: Right;   AV FISTULA PLACEMENT Left 05/11/2019   Procedure: ARTERIOVENOUS (AV) FISTULA CREATION (RADIOCEPHALIC );  Surgeon: Renford Dills, MD;  Location: [facility];  Service: Vascular;  Laterality: Left;   AV FISTULA PLACEMENT Left 02/20/2020   Procedure: INSERTION OF ARTERIOVENOUS (AV) GORE-TEX GRAFT LEFT ARM ( FOREARM LOOP );  Surgeon: Renford Dills, MD;  Location: [facility];  Service: Vascular;  Laterality: Left;   CATARACT EXTRACTION W/PHACO Left 11/01/2018   Procedure: CATARACT EXTRACTION PHACO AND INTRAOCULAR LENS PLACEMENT (IOC)-LEFT, DIABETIC-INSULIN DEPENDENT;  Surgeon: [place] Crane, MD;  Location: [facility];  Service: Ophthalmology;  Laterality: Left;  [place] 00:41.8 CDE 4.61 Fluid Pack Lot # [id number]   CATARACT EXTRACTION W/PHACO Right 04/27/2019    Procedure: CATARACT EXTRACTION PHACO AND INTRAOCULAR LENS PLACEMENT (IOC);  Surgeon: Nevada Crane, MD;  Location: [facility];  Service: Ophthalmology;  Laterality: Right;  [place] 00:34 CDE 1.97 FLUID PACK LOT # [id number]    COLONOSCOPY WITH PROPOFOL N/A 05/12/2020   Procedure: COLONOSCOPY WITH PROPOFOL;  Surgeon: [place], Boykin Nearing, MD;  Location: [facility];  Service: Gastroenterology;  Laterality: N/A;   DIALYSIS/PERMA CATHETER INSERTION N/A 02/23/2019   Procedure: DIALYSIS/PERMA CATHETER INSERTION;  Surgeon: Annice Needy, MD;  Location: [facility];  Service: Cardiovascular;  Laterality: N/A;   DIALYSIS/PERMA CATHETER REMOVAL N/A 04/03/2020   Procedure: DIALYSIS/PERMA CATHETER REMOVAL;  Surgeon: Renford Dills, MD;  Location: [facility];  Service: Cardiovascular;  Laterality: N/A;   DIALYSIS/PERMA CATHETER REMOVAL N/A 02/03/2022   Procedure: DIALYSIS/PERMA CATHETER REMOVAL;  Surgeon: Renford Dills, MD;  Location: [facility];  Service: Cardiovascular;  Laterality: N/A;  I & D EXTREMITY Left 10/11/2017   Procedure: IRRIGATION AND DEBRIDEMENT EXTREMITY;  Surgeon: Renford Dills, MD;  Location: [facility];  Service: Vascular;  Laterality: Left;   INCISION AND DRAINAGE ABSCESS Right 10/07/2017   Procedure: INCISION AND DRAINAGE ABSCESS;  Surgeon: Renford Dills, MD;  Location: [facility];  Service: Vascular;  Laterality: Right;   INCISION AND DRAINAGE OF WOUND Left 11/17/2022   Procedure: DEBRIDEMENT WOUND;  Surgeon: Renford Dills, MD;  Location: [facility];  Service: Vascular;  Laterality: Left;   INCISION AND DRAINAGE OF WOUND Left 11/24/2022   Procedure: DEBRIDEMENT WOUND LEFT LOWER EXTREMITY;  Surgeon: Renford Dills, MD;  Location: [facility];  Service: Vascular;  Laterality: Left;   IRRIGATION AND DEBRIDEMENT ABSCESS Left 10/03/2017   Procedure: IRRIGATION AND DEBRIDEMENT ABSCESS with debridement of skin, soft tissue, muscle 50sq cm;  Surgeon: Annice Needy, MD;  Location: [facility];  Service: General;  Laterality: Left;   LOWER EXTREMITY ANGIOGRAPHY Left 10/15/2022   Procedure: Lower Extremity Angiography;  Surgeon: Renford Dills, MD;  Location: [facility];  Service: Cardiovascular;  Laterality: Left;   TEMPORARY DIALYSIS CATHETER  02/20/2019   Procedure: TEMPORARY DIALYSIS CATHETER;  Surgeon: Renford Dills, MD;  Location: [facility];  Service: Cardiovascular;;   UPPER EXTREMITY ANGIOGRAPHY Left 09/18/2019   Procedure: UPPER EXTREMITY ANGIOGRAPHY;  Surgeon: Renford Dills, MD;  Location: [facility];  Service: Cardiovascular;  Laterality: Left;   WOUND DEBRIDEMENT Left 11/10/2022   Procedure: DEBRIDEMENT WOUND;  Surgeon: Renford Dills, MD;  Location: [facility];  Service: Vascular;  Laterality: Left;   WOUND DEBRIDEMENT Left 11/12/2022   Procedure: DEBRIDEMENT WOUND;  Surgeon: Renford Dills, MD;  Location: [facility];  Service: Vascular;  Laterality: Left;    Social History Social History   Tobacco Use   Smoking status: Former    Types: Cigars    Passive exposure: Past   Smokeless tobacco: Former    Quit date: 09/30/2017   Tobacco comments:    occasional  Vaping Use   Vaping Use: Never used  Substance Use Topics   Alcohol use: Yes    Alcohol/week: 1.0 standard drink of alcohol    Types: 1 Cans of beer per week    Comment: beer   Drug use: Not Currently    Family History Family History  Problem Relation Age of Onset   Diabetes Father    Kidney disease Father    Diabetes Daughter     Allergies  Allergen Reactions   Lisinopril Swelling    Lips mouth    Furosemide Cough    Light headed, blurry vision, weakness.   Latex    Adhesive [Tape] Itching and Rash     REVIEW OF SYSTEMS (Negative unless checked)  Constitutional: [] Weight loss  [] Fever  [] Chills Cardiac: [] Chest pain   [] Chest pressure   [] Palpitations   [] Shortness of breath when laying flat   [] Shortness of breath  with exertion. Vascular:  [] Pain in legs with walking   [x] Pain in legs at rest  [] History of DVT   [] Phlebitis   [x] Swelling in legs   [] Varicose veins   [] Non-healing ulcers Pulmonary:   [] Uses home oxygen   [] Productive cough   [] Hemoptysis   [] Wheeze  [] COPD   [] Asthma Neurologic:  [] Dizziness   [] Seizures   [] History of stroke   [] History of TIA  [] Aphasia   [] Vissual changes   [] Weakness or numbness in arm   []   Weakness or numbness in leg Musculoskeletal:   [] Joint swelling   [] Joint pain   [] Low back pain Hematologic:  [] Easy bruising  [] Easy bleeding   [] Hypercoagulable state   [] Anemic Gastrointestinal:  [] Diarrhea   [] Vomiting  [] Gastroesophageal reflux/heartburn   [] Difficulty swallowing. Genitourinary:  [] Chronic kidney disease   [] Difficult urination  [] Frequent urination   [] Blood in urine Skin:  [] Rashes   [] Ulcers  Psychological:  [] History of anxiety   []  History of major depression.  Physical Examination  There were no vitals filed for this visit. There is no height or weight on file to calculate BMI. Gen: WD/WN, NAD Head: [place]/AT, No temporalis wasting.  Ear/Nose/Throat: Hearing grossly intact, nares w/o erythema or drainage, pinna without lesions Eyes: PER, EOMI, sclera nonicteric.  Neck: Supple, no gross masses.  No JVD.  Pulmonary:  Good air movement, no audible wheezing, no use of accessory muscles.  Cardiac: RRR, precordium not hyperdynamic. Vascular:  scattered varicosities present bilaterally.  Severe venous stasis changes to the legs bilaterally.  4+ soft pitting edema. CEAP [id number].  Wound is well granulated but drainage is noted.  No surrounding cellulitis.  Left arm AV graft good thrill good bruit Vessel Right Left  Radial Palpable Palpable  Gastrointestinal: soft, non-distended. No guarding/no peritoneal signs.  Musculoskeletal: M/S 5/5 throughout.  No deformity.  Neurologic: CN 2-12 intact. Pain and light touch intact in extremities.  Symmetrical.  Speech is  fluent. Motor exam as listed above. Psychiatric: Judgment intact, Mood & affect appropriate for pt's clinical situation. Dermatologic: Venous rashes no ulcers noted.  No changes consistent with cellulitis. Lymph : No lichenification or skin changes of chronic lymphedema.  CBC Lab Results  Component Value Date   WBC 4.7 11/25/2022   HGB 9.5 (L) 11/25/2022   HCT 30.6 (L) 11/25/2022   MCV 99.7 11/25/2022   PLT 156 11/25/2022    BMET    Component Value Date/Time   NA 137 11/25/2022 0844   K 4.4 11/25/2022 0844   CL 96 (L) 11/25/2022 0844   CO2 25 11/25/2022 0844   GLUCOSE 95 11/25/2022 0844   BUN 67 (H) 11/25/2022 0844   CREATININE 13.65 (H) 11/25/2022 0844   CALCIUM 9.1 11/25/2022 0844   GFRNONAA 4 (L) 11/25/2022 0844   GFRAA 9 (L) 02/19/2020 1316   CrCl cannot be calculated (Patient's most recent lab result is older than the maximum 21 days allowed.).  COAG Lab Results  Component Value Date   INR 1.1 11/12/2022   INR 1.0 11/10/2022   INR 2.6 (H) 10/05/2022    Radiology MR BRAIN W WO CONTRAST  Result Date: 12/04/2022 CLINICAL DATA:  Possible pituitary adenoma EXAM: MRI HEAD WITHOUT AND WITH CONTRAST TECHNIQUE: Multiplanar, multiecho pulse sequences of the brain and surrounding structures were obtained without and with intravenous contrast. CONTRAST:  7.100mL GADAVIST GADOBUTROL 1 MMOL/ML IV SOLN COMPARISON:  MRI brain/sella 05/09/2020 FINDINGS: Brain: There is no acute intracranial hemorrhage, extra-axial fluid collection, or acute infarct Parenchymal volume is normal. The ventricles are normal in size. Parenchymal signal is normal. There is no mass lesion or abnormal enhancement. There is no mass effect or midline shift. Pituitary/Sella: Pituitary gland is normal in size. The infundibulum is midline. The previously described focus of hypoenhancement in the right aspect of the sella is not definitely seen on the current study. There is no convincing evidence of pituitary adenoma  on the current study. Vascular: Normal flow voids. Skull and upper cervical spine: Normal marrow signal. Sinuses/Orbits: The paranasal sinuses  are clear. Bilateral lens implants are in place. The globes and orbits are otherwise unremarkable. Other: None. IMPRESSION: No convincing evidence of pituitary adenoma on the current study. Electronically Signed   By: Lesia Hausen M.D.   On: 12/04/2022 11:03     Assessment/Plan 1. Venous ulcer of ankle, left (HCC) No surgery or intervention at this point in time.    I have had a long discussion with the patient regarding venous insufficiency and why it  causes symptoms, specifically venous ulceration. I have discussed with the patient the chronic skin changes that accompany venous insufficiency and the long term sequela such as infection and recurring  ulceration.  Patient will continue [employer] which will be changed weekly drainage permitting.  I also discussed that skin grafting would not be feasible given the amount of drainage we would never get adherence of the graft.  In addition, behavioral modification including several periods of elevation of the lower extremities during the day will be continued. Achieving a position with the ankles at heart level was stressed to the patient  The patient is instructed to begin routine exercise, especially walking on a daily basis  In the future the patient can be assessed for graduated compression stockings or wraps as well as a Lymph Pump once the ulcers are healed.  2. End stage renal disease (HCC) Recommend:  The patient is doing well and currently has adequate dialysis access. The patient's dialysis center is not reporting any access issues. Flow pattern is stable when compared to the prior ultrasound.  The patient should have a duplex ultrasound of the dialysis access in 6 months. The patient will follow-up with me in the office after each ultrasound   - VAS US DUPLEX DIALYSIS ACCESS (AVF, AVG);  Future  3. Chronic venous insufficiency See #1  4. Primary hypertension Continue antihypertensive medications as already ordered, these medications have been reviewed and there are no changes at this time.  5. Type 2 diabetes mellitus with hyperglycemia, with long-term current use of insulin (HCC) Continue hypoglycemic medications as already ordered, these medications have been reviewed and there are no changes at this time.  Hgb A1C to be monitored as already arranged by primary service    [name], MD  12/26/2022 12:19 PM

## 2022-12-26 NOTE — H&P (View-Only) (Signed)
MRN : [id number]  [name] is a 52 y.o. (09-07-1970) male who presents with chief complaint of legs hurt and swell.  History of Present Illness:   The patient returns to the office for followup of their dialysis access.   The patient reports the function of the access has been stable. Patient denies difficulty with cannulation. The patient denies increased bleeding time after removing the needles. The patient denies hand pain or other symptoms consistent with steal phenomena.  No significant arm swelling.  The patient denies any complaints from the dialysis center or their nephrologist.  The patient denies redness or swelling at the access site. The patient denies fever or chills at home or while on dialysis.  The patient continues to treat his left ankle ulcer with wraps.  Wound care is visiting.  He denies pain.  He notes there is still significant drainage.  The patient denies amaurosis fugax or recent TIA symptoms. There are no recent neurological changes noted. There is no history of DVT, PE or superficial thrombophlebitis. No recent episodes of angina or shortness of breath documented.   Duplex ultrasound of the AV access shows a patent access.  The previously noted stenosis is not significantly changed compared to last study.  Flow volume today is 1312 cc/min (previous flow volume was 990 cc/min)    No outpatient medications have been marked as taking for the 12/27/22 encounter (Appointment) with Gilda Crease, Latina Craver, MD.    Past Medical History:  Diagnosis Date   Anemia    Chronic kidney disease    14% FUNCTION   Diabetes mellitus without complication (HCC)    Diabetic retinopathy (HCC)    Dyspnea    DOE   Dysrhythmia    End stage renal disease (HCC)    History of orthopnea    error wrong patietn   Hypercholesteremia    error   Hyperkalemia    error wrong patient   Hypertension    Long-term insulin use (HCC)    Lymphedema of leg    Metabolic  encephalopathy    error   Microalbuminuria    MSSA (methicillin susceptible Staphylococcus aureus)    Obesity    PVD (peripheral vascular disease) (HCC)    Respiratory failure (HCC)    Error   Sepsis (HCC)    error   Sleep apnea    no CPAP   UTI (urinary tract infection)    error   Venous ulcer (HCC)    Vitamin D deficiency     Past Surgical History:  Procedure Laterality Date   A/V FISTULAGRAM Right 10/24/2018   Procedure: A/V FISTULAGRAM;  Surgeon: Renford Dills, MD;  Location: [facility];  Service: Cardiovascular;  Laterality: Right;   A/V FISTULAGRAM Right 11/24/2018   Procedure: A/V FISTULAGRAM;  Surgeon: Renford Dills, MD;  Location: [facility];  Service: Cardiovascular;  Laterality: Right;   A/V FISTULAGRAM Left 07/31/2019   Procedure: A/V FISTULAGRAM;  Surgeon: Renford Dills, MD;  Location: [facility];  Service: Cardiovascular;  Laterality: Left;   A/V FISTULAGRAM Left 12/11/2019   Procedure: A/V FISTULAGRAM;  Surgeon: Renford Dills, MD;  Location: [facility];  Service: Cardiovascular;  Laterality: Left;   A/V SHUNT INTERVENTION Right 02/21/2019   Procedure: A/V SHUNT INTERVENTION;  Surgeon: Renford Dills, MD;  Location: [facility];  Service: Cardiovascular;  Laterality: Right;   APPLICATION OF  WOUND VAC Left 10/03/2017   Procedure: APPLICATION OF WOUND VAC;  Surgeon: Annice Needy, MD;  Location: [facility];  Service: General;  Laterality: Left;   APPLICATION OF WOUND VAC Left 10/11/2017   Procedure: APPLICATION OF WOUND VAC;  Surgeon: Renford Dills, MD;  Location: [facility];  Service: Vascular;  Laterality: Left;   APPLICATION OF WOUND VAC Left 10/14/2017   Procedure: WOUND VAC CHANGE;  Surgeon: Renford Dills, MD;  Location: [facility];  Service: Vascular;  Laterality: Left;  left lower leg   APPLICATION OF WOUND VAC Left 10/18/2017   Procedure: WOUND VAC CHANGE;  Surgeon: Renford Dills, MD;   Location: [facility];  Service: Vascular;  Laterality: Left;   APPLICATION OF WOUND VAC Left 10/07/2017   Procedure: APPLICATION OF WOUND VAC;  Surgeon: Renford Dills, MD;  Location: [facility];  Service: Vascular;  Laterality: Left;   APPLICATION OF WOUND VAC Left 11/17/2022   Procedure: Wound vac exchange;  Surgeon: Renford Dills, MD;  Location: [facility];  Service: Vascular;  Laterality: Left;   APPLICATION OF WOUND VAC Left 11/10/2022   Procedure: APPLICATION OF WOUND VAC;  Surgeon: Renford Dills, MD;  Location: [facility];  Service: Vascular;  Laterality: Left;   APPLICATION OF WOUND VAC Left 11/24/2022   Procedure: WOUND VAC REMOVAL;  Surgeon: Renford Dills, MD;  Location: [facility];  Service: Vascular;  Laterality: Left;   APPLICATION OF WOUND VAC Left 11/12/2022   Procedure: APPLICATION OF WOUND VAC;  Surgeon: Renford Dills, MD;  Location: [facility];  Service: Vascular;  Laterality: Left;   AV FISTULA INSERTION W/ RF MAGNETIC GUIDANCE Right 07/19/2018   Procedure: AV FISTULA INSERTION W/RF MAGNETIC GUIDANCE;  Surgeon: Renford Dills, MD;  Location: [facility];  Service: Cardiovascular;  Laterality: Right;   AV FISTULA PLACEMENT Left 05/11/2019   Procedure: ARTERIOVENOUS (AV) FISTULA CREATION (RADIOCEPHALIC );  Surgeon: Renford Dills, MD;  Location: [facility];  Service: Vascular;  Laterality: Left;   AV FISTULA PLACEMENT Left 02/20/2020   Procedure: INSERTION OF ARTERIOVENOUS (AV) GORE-TEX GRAFT LEFT ARM ( FOREARM LOOP );  Surgeon: Renford Dills, MD;  Location: [facility];  Service: Vascular;  Laterality: Left;   CATARACT EXTRACTION W/PHACO Left 11/01/2018   Procedure: CATARACT EXTRACTION PHACO AND INTRAOCULAR LENS PLACEMENT (IOC)-LEFT, DIABETIC-INSULIN DEPENDENT;  Surgeon: Nevada Crane, MD;  Location: [facility];  Service: Ophthalmology;  Laterality: Left;  [place] 00:41.8 CDE 4.61 Fluid Pack Lot # [id number]   CATARACT EXTRACTION W/PHACO Right 04/27/2019    Procedure: CATARACT EXTRACTION PHACO AND INTRAOCULAR LENS PLACEMENT (IOC);  Surgeon: Nevada Crane, MD;  Location: [facility];  Service: Ophthalmology;  Laterality: Right;  [place] 00:34 CDE 1.97 FLUID PACK LOT # [id number]    COLONOSCOPY WITH PROPOFOL N/A 05/12/2020   Procedure: COLONOSCOPY WITH PROPOFOL;  Surgeon: [place], Boykin Nearing, MD;  Location: [facility];  Service: Gastroenterology;  Laterality: N/A;   DIALYSIS/PERMA CATHETER INSERTION N/A 02/23/2019   Procedure: DIALYSIS/PERMA CATHETER INSERTION;  Surgeon: Annice Needy, MD;  Location: [facility];  Service: Cardiovascular;  Laterality: N/A;   DIALYSIS/PERMA CATHETER REMOVAL N/A 04/03/2020   Procedure: DIALYSIS/PERMA CATHETER REMOVAL;  Surgeon: Renford Dills, MD;  Location: [facility];  Service: Cardiovascular;  Laterality: N/A;   DIALYSIS/PERMA CATHETER REMOVAL N/A 02/03/2022   Procedure: DIALYSIS/PERMA CATHETER REMOVAL;  Surgeon: Renford Dills, MD;  Location: [facility];  Service: Cardiovascular;  Laterality: N/A;  I & D EXTREMITY Left 10/11/2017   Procedure: IRRIGATION AND DEBRIDEMENT EXTREMITY;  Surgeon: Renford Dills, MD;  Location: [facility];  Service: Vascular;  Laterality: Left;   INCISION AND DRAINAGE ABSCESS Right 10/07/2017   Procedure: INCISION AND DRAINAGE ABSCESS;  Surgeon: Renford Dills, MD;  Location: [facility];  Service: Vascular;  Laterality: Right;   INCISION AND DRAINAGE OF WOUND Left 11/17/2022   Procedure: DEBRIDEMENT WOUND;  Surgeon: Renford Dills, MD;  Location: [facility];  Service: Vascular;  Laterality: Left;   INCISION AND DRAINAGE OF WOUND Left 11/24/2022   Procedure: DEBRIDEMENT WOUND LEFT LOWER EXTREMITY;  Surgeon: Renford Dills, MD;  Location: [facility];  Service: Vascular;  Laterality: Left;   IRRIGATION AND DEBRIDEMENT ABSCESS Left 10/03/2017   Procedure: IRRIGATION AND DEBRIDEMENT ABSCESS with debridement of skin, soft tissue, muscle 50sq cm;  Surgeon: Annice Needy, MD;  Location: [facility];  Service: General;  Laterality: Left;   LOWER EXTREMITY ANGIOGRAPHY Left 10/15/2022   Procedure: Lower Extremity Angiography;  Surgeon: Renford Dills, MD;  Location: [facility];  Service: Cardiovascular;  Laterality: Left;   TEMPORARY DIALYSIS CATHETER  02/20/2019   Procedure: TEMPORARY DIALYSIS CATHETER;  Surgeon: Renford Dills, MD;  Location: [facility];  Service: Cardiovascular;;   UPPER EXTREMITY ANGIOGRAPHY Left 09/18/2019   Procedure: UPPER EXTREMITY ANGIOGRAPHY;  Surgeon: Renford Dills, MD;  Location: [facility];  Service: Cardiovascular;  Laterality: Left;   WOUND DEBRIDEMENT Left 11/10/2022   Procedure: DEBRIDEMENT WOUND;  Surgeon: Renford Dills, MD;  Location: [facility];  Service: Vascular;  Laterality: Left;   WOUND DEBRIDEMENT Left 11/12/2022   Procedure: DEBRIDEMENT WOUND;  Surgeon: Renford Dills, MD;  Location: [facility];  Service: Vascular;  Laterality: Left;    Social History Social History   Tobacco Use   Smoking status: Former    Types: Cigars    Passive exposure: Past   Smokeless tobacco: Former    Quit date: 09/30/2017   Tobacco comments:    occasional  Vaping Use   Vaping Use: Never used  Substance Use Topics   Alcohol use: Yes    Alcohol/week: 1.0 standard drink of alcohol    Types: 1 Cans of beer per week    Comment: beer   Drug use: Not Currently    Family History Family History  Problem Relation Age of Onset   Diabetes Father    Kidney disease Father    Diabetes Daughter     Allergies  Allergen Reactions   Lisinopril Swelling    Lips mouth    Furosemide Cough    Light headed, blurry vision, weakness.   Latex    Adhesive [Tape] Itching and Rash     REVIEW OF SYSTEMS (Negative unless checked)  Constitutional: [] Weight loss  [] Fever  [] Chills Cardiac: [] Chest pain   [] Chest pressure   [] Palpitations   [] Shortness of breath when laying flat   [] Shortness of breath  with exertion. Vascular:  [] Pain in legs with walking   [x] Pain in legs at rest  [] History of DVT   [] Phlebitis   [x] Swelling in legs   [] Varicose veins   [] Non-healing ulcers Pulmonary:   [] Uses home oxygen   [] Productive cough   [] Hemoptysis   [] Wheeze  [] COPD   [] Asthma Neurologic:  [] Dizziness   [] Seizures   [] History of stroke   [] History of TIA  [] Aphasia   [] Vissual changes   [] Weakness or numbness in arm   []   Weakness or numbness in leg Musculoskeletal:   [] Joint swelling   [] Joint pain   [] Low back pain Hematologic:  [] Easy bruising  [] Easy bleeding   [] Hypercoagulable state   [] Anemic Gastrointestinal:  [] Diarrhea   [] Vomiting  [] Gastroesophageal reflux/heartburn   [] Difficulty swallowing. Genitourinary:  [] Chronic kidney disease   [] Difficult urination  [] Frequent urination   [] Blood in urine Skin:  [] Rashes   [] Ulcers  Psychological:  [] History of anxiety   []  History of major depression.  Physical Examination  There were no vitals filed for this visit. There is no height or weight on file to calculate BMI. Gen: WD/WN, NAD Head: [place]/AT, No temporalis wasting.  Ear/Nose/Throat: Hearing grossly intact, nares w/o erythema or drainage, pinna without lesions Eyes: PER, EOMI, sclera nonicteric.  Neck: Supple, no gross masses.  No JVD.  Pulmonary:  Good air movement, no audible wheezing, no use of accessory muscles.  Cardiac: RRR, precordium not hyperdynamic. Vascular:  scattered varicosities present bilaterally.  Severe venous stasis changes to the legs bilaterally.  4+ soft pitting edema. CEAP [id number].  Wound is well granulated but drainage is noted.  No surrounding cellulitis.  Left arm AV graft good thrill good bruit Vessel Right Left  Radial Palpable Palpable  Gastrointestinal: soft, non-distended. No guarding/no peritoneal signs.  Musculoskeletal: M/S 5/5 throughout.  No deformity.  Neurologic: CN 2-12 intact. Pain and light touch intact in extremities.  Symmetrical.  Speech is  fluent. Motor exam as listed above. Psychiatric: Judgment intact, Mood & affect appropriate for pt's clinical situation. Dermatologic: Venous rashes no ulcers noted.  No changes consistent with cellulitis. Lymph : No lichenification or skin changes of chronic lymphedema.  CBC Lab Results  Component Value Date   WBC 4.7 11/25/2022   HGB 9.5 (L) 11/25/2022   HCT 30.6 (L) 11/25/2022   MCV 99.7 11/25/2022   PLT 156 11/25/2022    BMET    Component Value Date/Time   NA 137 11/25/2022 0844   K 4.4 11/25/2022 0844   CL 96 (L) 11/25/2022 0844   CO2 25 11/25/2022 0844   GLUCOSE 95 11/25/2022 0844   BUN 67 (H) 11/25/2022 0844   CREATININE 13.65 (H) 11/25/2022 0844   CALCIUM 9.1 11/25/2022 0844   GFRNONAA 4 (L) 11/25/2022 0844   GFRAA 9 (L) 02/19/2020 1316   CrCl cannot be calculated (Patient's most recent lab result is older than the maximum 21 days allowed.).  COAG Lab Results  Component Value Date   INR 1.1 11/12/2022   INR 1.0 11/10/2022   INR 2.6 (H) 10/05/2022    Radiology MR BRAIN W WO CONTRAST  Result Date: 12/04/2022 CLINICAL DATA:  Possible pituitary adenoma EXAM: MRI HEAD WITHOUT AND WITH CONTRAST TECHNIQUE: Multiplanar, multiecho pulse sequences of the brain and surrounding structures were obtained without and with intravenous contrast. CONTRAST:  7.100mL GADAVIST GADOBUTROL 1 MMOL/ML IV SOLN COMPARISON:  MRI brain/sella 05/09/2020 FINDINGS: Brain: There is no acute intracranial hemorrhage, extra-axial fluid collection, or acute infarct Parenchymal volume is normal. The ventricles are normal in size. Parenchymal signal is normal. There is no mass lesion or abnormal enhancement. There is no mass effect or midline shift. Pituitary/Sella: Pituitary gland is normal in size. The infundibulum is midline. The previously described focus of hypoenhancement in the right aspect of the sella is not definitely seen on the current study. There is no convincing evidence of pituitary adenoma  on the current study. Vascular: Normal flow voids. Skull and upper cervical spine: Normal marrow signal. Sinuses/Orbits: The paranasal sinuses  are clear. Bilateral lens implants are in place. The globes and orbits are otherwise unremarkable. Other: None. IMPRESSION: No convincing evidence of pituitary adenoma on the current study. Electronically Signed   By: Lesia Hausen M.D.   On: 12/04/2022 11:03     Assessment/Plan 1. Venous ulcer of ankle, left (HCC) No surgery or intervention at this point in time.    I have had a long discussion with the patient regarding venous insufficiency and why it  causes symptoms, specifically venous ulceration. I have discussed with the patient the chronic skin changes that accompany venous insufficiency and the long term sequela such as infection and recurring  ulceration.  Patient will continue [employer] which will be changed weekly drainage permitting.  I also discussed that skin grafting would not be feasible given the amount of drainage we would never get adherence of the graft.  In addition, behavioral modification including several periods of elevation of the lower extremities during the day will be continued. Achieving a position with the ankles at heart level was stressed to the patient  The patient is instructed to begin routine exercise, especially walking on a daily basis  In the future the patient can be assessed for graduated compression stockings or wraps as well as a Lymph Pump once the ulcers are healed.  2. End stage renal disease (HCC) Recommend:  The patient is doing well and currently has adequate dialysis access. The patient's dialysis center is not reporting any access issues. Flow pattern is stable when compared to the prior ultrasound.  The patient should have a duplex ultrasound of the dialysis access in 6 months. The patient will follow-up with me in the office after each ultrasound   - VAS US DUPLEX DIALYSIS ACCESS (AVF, AVG);  Future  3. Chronic venous insufficiency See #1  4. Primary hypertension Continue antihypertensive medications as already ordered, these medications have been reviewed and there are no changes at this time.  5. Type 2 diabetes mellitus with hyperglycemia, with long-term current use of insulin (HCC) Continue hypoglycemic medications as already ordered, these medications have been reviewed and there are no changes at this time.  Hgb A1C to be monitored as already arranged by primary service    [name], MD  12/26/2022 12:19 PM

## 2022-12-27 ENCOUNTER — Ambulatory Visit (INDEPENDENT_AMBULATORY_CARE_PROVIDER_SITE_OTHER): Payer: Medicare HMO

## 2022-12-27 ENCOUNTER — Encounter (INDEPENDENT_AMBULATORY_CARE_PROVIDER_SITE_OTHER): Payer: Self-pay | Admitting: Vascular Surgery

## 2022-12-27 ENCOUNTER — Ambulatory Visit (INDEPENDENT_AMBULATORY_CARE_PROVIDER_SITE_OTHER): Payer: Medicare HMO | Admitting: Vascular Surgery

## 2022-12-27 VITALS — BP 169/81 | HR 64 | Resp 16 | Wt 396.0 lb

## 2022-12-27 DIAGNOSIS — I83023 Varicose veins of left lower extremity with ulcer of ankle: Secondary | ICD-10-CM

## 2022-12-27 DIAGNOSIS — N186 End stage renal disease: Secondary | ICD-10-CM | POA: Diagnosis not present

## 2022-12-27 DIAGNOSIS — Z794 Long term (current) use of insulin: Secondary | ICD-10-CM | POA: Diagnosis not present

## 2022-12-27 DIAGNOSIS — E785 Hyperlipidemia, unspecified: Secondary | ICD-10-CM | POA: Diagnosis not present

## 2022-12-27 DIAGNOSIS — E1165 Type 2 diabetes mellitus with hyperglycemia: Secondary | ICD-10-CM | POA: Diagnosis not present

## 2022-12-27 DIAGNOSIS — I872 Venous insufficiency (chronic) (peripheral): Secondary | ICD-10-CM

## 2022-12-27 DIAGNOSIS — I10 Essential (primary) hypertension: Secondary | ICD-10-CM

## 2022-12-27 DIAGNOSIS — L97329 Non-pressure chronic ulcer of left ankle with unspecified severity: Secondary | ICD-10-CM | POA: Diagnosis not present

## 2022-12-28 DIAGNOSIS — N2581 Secondary hyperparathyroidism of renal origin: Secondary | ICD-10-CM | POA: Diagnosis not present

## 2022-12-28 DIAGNOSIS — N186 End stage renal disease: Secondary | ICD-10-CM | POA: Diagnosis not present

## 2022-12-28 DIAGNOSIS — Z992 Dependence on renal dialysis: Secondary | ICD-10-CM | POA: Diagnosis not present

## 2022-12-29 ENCOUNTER — Encounter (INDEPENDENT_AMBULATORY_CARE_PROVIDER_SITE_OTHER): Payer: Medicare HMO

## 2022-12-29 DIAGNOSIS — I872 Venous insufficiency (chronic) (peripheral): Secondary | ICD-10-CM | POA: Diagnosis not present

## 2022-12-29 DIAGNOSIS — E1165 Type 2 diabetes mellitus with hyperglycemia: Secondary | ICD-10-CM | POA: Diagnosis not present

## 2022-12-29 DIAGNOSIS — I499 Cardiac arrhythmia, unspecified: Secondary | ICD-10-CM | POA: Diagnosis not present

## 2022-12-29 DIAGNOSIS — E1122 Type 2 diabetes mellitus with diabetic chronic kidney disease: Secondary | ICD-10-CM | POA: Diagnosis not present

## 2022-12-29 DIAGNOSIS — I890 Lymphedema, not elsewhere classified: Secondary | ICD-10-CM | POA: Diagnosis not present

## 2022-12-29 DIAGNOSIS — I120 Hypertensive chronic kidney disease with stage 5 chronic kidney disease or end stage renal disease: Secondary | ICD-10-CM | POA: Diagnosis not present

## 2022-12-29 DIAGNOSIS — N2581 Secondary hyperparathyroidism of renal origin: Secondary | ICD-10-CM | POA: Diagnosis not present

## 2022-12-29 DIAGNOSIS — J45909 Unspecified asthma, uncomplicated: Secondary | ICD-10-CM | POA: Diagnosis not present

## 2022-12-29 DIAGNOSIS — Z961 Presence of intraocular lens: Secondary | ICD-10-CM | POA: Diagnosis not present

## 2022-12-29 DIAGNOSIS — E113593 Type 2 diabetes mellitus with proliferative diabetic retinopathy without macular edema, bilateral: Secondary | ICD-10-CM | POA: Diagnosis not present

## 2022-12-29 DIAGNOSIS — Z992 Dependence on renal dialysis: Secondary | ICD-10-CM | POA: Diagnosis not present

## 2022-12-29 DIAGNOSIS — E1152 Type 2 diabetes mellitus with diabetic peripheral angiopathy with gangrene: Secondary | ICD-10-CM | POA: Diagnosis not present

## 2022-12-29 DIAGNOSIS — H4312 Vitreous hemorrhage, left eye: Secondary | ICD-10-CM | POA: Diagnosis not present

## 2022-12-29 DIAGNOSIS — N186 End stage renal disease: Secondary | ICD-10-CM | POA: Diagnosis not present

## 2022-12-29 DIAGNOSIS — E113592 Type 2 diabetes mellitus with proliferative diabetic retinopathy without macular edema, left eye: Secondary | ICD-10-CM | POA: Diagnosis not present

## 2022-12-30 DIAGNOSIS — N2581 Secondary hyperparathyroidism of renal origin: Secondary | ICD-10-CM | POA: Diagnosis not present

## 2022-12-30 DIAGNOSIS — Z992 Dependence on renal dialysis: Secondary | ICD-10-CM | POA: Diagnosis not present

## 2022-12-30 DIAGNOSIS — N186 End stage renal disease: Secondary | ICD-10-CM | POA: Diagnosis not present

## 2022-12-31 DIAGNOSIS — E1122 Type 2 diabetes mellitus with diabetic chronic kidney disease: Secondary | ICD-10-CM | POA: Diagnosis not present

## 2022-12-31 DIAGNOSIS — I499 Cardiac arrhythmia, unspecified: Secondary | ICD-10-CM | POA: Diagnosis not present

## 2022-12-31 DIAGNOSIS — I120 Hypertensive chronic kidney disease with stage 5 chronic kidney disease or end stage renal disease: Secondary | ICD-10-CM | POA: Diagnosis not present

## 2022-12-31 DIAGNOSIS — I872 Venous insufficiency (chronic) (peripheral): Secondary | ICD-10-CM | POA: Diagnosis not present

## 2022-12-31 DIAGNOSIS — J45909 Unspecified asthma, uncomplicated: Secondary | ICD-10-CM | POA: Diagnosis not present

## 2022-12-31 DIAGNOSIS — E1152 Type 2 diabetes mellitus with diabetic peripheral angiopathy with gangrene: Secondary | ICD-10-CM | POA: Diagnosis not present

## 2022-12-31 DIAGNOSIS — I890 Lymphedema, not elsewhere classified: Secondary | ICD-10-CM | POA: Diagnosis not present

## 2022-12-31 DIAGNOSIS — N186 End stage renal disease: Secondary | ICD-10-CM | POA: Diagnosis not present

## 2022-12-31 DIAGNOSIS — E1165 Type 2 diabetes mellitus with hyperglycemia: Secondary | ICD-10-CM | POA: Diagnosis not present

## 2023-01-01 DIAGNOSIS — N2581 Secondary hyperparathyroidism of renal origin: Secondary | ICD-10-CM | POA: Diagnosis not present

## 2023-01-01 DIAGNOSIS — Z992 Dependence on renal dialysis: Secondary | ICD-10-CM | POA: Diagnosis not present

## 2023-01-01 DIAGNOSIS — N186 End stage renal disease: Secondary | ICD-10-CM | POA: Diagnosis not present

## 2023-01-02 ENCOUNTER — Encounter (INDEPENDENT_AMBULATORY_CARE_PROVIDER_SITE_OTHER): Payer: Self-pay | Admitting: Vascular Surgery

## 2023-01-03 ENCOUNTER — Telehealth (INDEPENDENT_AMBULATORY_CARE_PROVIDER_SITE_OTHER): Payer: Self-pay

## 2023-01-03 DIAGNOSIS — I872 Venous insufficiency (chronic) (peripheral): Secondary | ICD-10-CM | POA: Diagnosis not present

## 2023-01-03 DIAGNOSIS — E1122 Type 2 diabetes mellitus with diabetic chronic kidney disease: Secondary | ICD-10-CM | POA: Diagnosis not present

## 2023-01-03 DIAGNOSIS — I120 Hypertensive chronic kidney disease with stage 5 chronic kidney disease or end stage renal disease: Secondary | ICD-10-CM | POA: Diagnosis not present

## 2023-01-03 DIAGNOSIS — E1152 Type 2 diabetes mellitus with diabetic peripheral angiopathy with gangrene: Secondary | ICD-10-CM | POA: Diagnosis not present

## 2023-01-03 DIAGNOSIS — N186 End stage renal disease: Secondary | ICD-10-CM | POA: Diagnosis not present

## 2023-01-03 DIAGNOSIS — E1165 Type 2 diabetes mellitus with hyperglycemia: Secondary | ICD-10-CM | POA: Diagnosis not present

## 2023-01-03 DIAGNOSIS — J45909 Unspecified asthma, uncomplicated: Secondary | ICD-10-CM | POA: Diagnosis not present

## 2023-01-03 DIAGNOSIS — I499 Cardiac arrhythmia, unspecified: Secondary | ICD-10-CM | POA: Diagnosis not present

## 2023-01-03 DIAGNOSIS — I890 Lymphedema, not elsewhere classified: Secondary | ICD-10-CM | POA: Diagnosis not present

## 2023-01-03 NOTE — Telephone Encounter (Signed)
Patient home health nurse reach out to the office to informed during patient visit that when removed his wrap that there was a heavy bloody drainage with odor. I spoke with Vivia Birmingham NP and she recommended for the patient to be seen tomorrow. Patient is schedule to come in for 01/04/23.

## 2023-01-04 ENCOUNTER — Encounter (INDEPENDENT_AMBULATORY_CARE_PROVIDER_SITE_OTHER): Payer: Self-pay | Admitting: Nurse Practitioner

## 2023-01-04 ENCOUNTER — Ambulatory Visit (INDEPENDENT_AMBULATORY_CARE_PROVIDER_SITE_OTHER): Payer: Medicare HMO | Admitting: Nurse Practitioner

## 2023-01-04 ENCOUNTER — Other Ambulatory Visit (INDEPENDENT_AMBULATORY_CARE_PROVIDER_SITE_OTHER): Payer: Self-pay | Admitting: Nurse Practitioner

## 2023-01-04 VITALS — BP 146/81 | HR 81 | Resp 18

## 2023-01-04 DIAGNOSIS — N2581 Secondary hyperparathyroidism of renal origin: Secondary | ICD-10-CM | POA: Diagnosis not present

## 2023-01-04 DIAGNOSIS — E1165 Type 2 diabetes mellitus with hyperglycemia: Secondary | ICD-10-CM

## 2023-01-04 DIAGNOSIS — Z992 Dependence on renal dialysis: Secondary | ICD-10-CM | POA: Diagnosis not present

## 2023-01-04 DIAGNOSIS — N186 End stage renal disease: Secondary | ICD-10-CM

## 2023-01-04 DIAGNOSIS — L97329 Non-pressure chronic ulcer of left ankle with unspecified severity: Secondary | ICD-10-CM

## 2023-01-04 DIAGNOSIS — Z794 Long term (current) use of insulin: Secondary | ICD-10-CM

## 2023-01-04 DIAGNOSIS — I83023 Varicose veins of left lower extremity with ulcer of ankle: Secondary | ICD-10-CM

## 2023-01-04 DIAGNOSIS — I890 Lymphedema, not elsewhere classified: Secondary | ICD-10-CM

## 2023-01-04 MED ORDER — OXYCODONE HCL 5 MG PO TABS: 5.0000 mg | ORAL_TABLET | Freq: Four times a day (QID) | ORAL | 0 refills | Status: DC | PRN

## 2023-01-04 MED ORDER — CIPROFLOXACIN HCL 500 MG PO TABS: 500.0000 mg | ORAL_TABLET | Freq: Every day | ORAL | 0 refills | Status: DC

## 2023-01-04 MED ORDER — CEPHALEXIN 500 MG PO CAPS: 500.0000 mg | ORAL_CAPSULE | Freq: Every day | ORAL | 0 refills | Status: DC

## 2023-01-04 NOTE — Progress Notes (Signed)
Subjective:    Patient ID: [name], male    DOB: 09-29-1970, 52 y.o.   MRN: [id number] No chief complaint on file.   The patient is a 52 year old male that presents today for wound evaluation.  He was previously seen last week and his wound was progressing without any foul-smelling drainage with good granulation tissue.  His home health nurse called yesterday to note that he had significant drainage from the wound as well as a foul-smelling odor.  This is an abrupt change for his wound.  Today the wound is indeed foul-smelling however the tissue appears to have a film over the area.  There also appears to be significant drainage in the areas.    Review of Systems  Cardiovascular:  Positive for leg swelling.  Skin:  Positive for wound.  Hematological:  Bruises/bleeds easily.  All other systems reviewed and are negative.      Objective:   Physical Exam Vitals reviewed.  HENT:     Head: Normocephalic.  Cardiovascular:     Rate and Rhythm: Normal rate.  Pulmonary:     Effort: Pulmonary effort is normal.  Musculoskeletal:     Right lower leg: 3+ Pitting Edema present.  Skin:    General: Skin is warm and dry.  Neurological:     Mental Status: He is alert and oriented to person, place, and time.  Psychiatric:        Mood and Affect: Mood normal.        Behavior: Behavior normal.        Thought Content: Thought content normal.        Judgment: Judgment normal.     There were no vitals taken for this visit.  Past Medical History:  Diagnosis Date   Anemia    Chronic kidney disease    14% FUNCTION   Diabetes mellitus without complication (HCC)    Diabetic retinopathy (HCC)    Dyspnea    DOE   Dysrhythmia    End stage renal disease (HCC)    History of orthopnea    error wrong patietn   Hypercholesteremia    error   Hyperkalemia    error wrong patient   Hypertension    Long-term insulin use (HCC)    Lymphedema of leg    Metabolic encephalopathy    error    Microalbuminuria    MSSA (methicillin susceptible Staphylococcus aureus)    Obesity    PVD (peripheral vascular disease) (HCC)    Respiratory failure (HCC)    Error   Sepsis (HCC)    error   Sleep apnea    no CPAP   UTI (urinary tract infection)    error   Venous ulcer (HCC)    Vitamin D deficiency     Social History   Socioeconomic History   Marital status: Divorced    Spouse name: Not on file   Number of children: Not on file   Years of education: Not on file   Highest education level: Not on file  Occupational History   Not on file  Tobacco Use   Smoking status: Former    Types: Cigars    Passive exposure: Past   Smokeless tobacco: Former    Quit date: 09/30/2017   Tobacco comments:    occasional  Vaping Use   Vaping Use: Never used  Substance and Sexual Activity   Alcohol use: Yes    Alcohol/week: 1.0 standard drink of alcohol    Types: 1  Cans of beer per week    Comment: beer   Drug use: Not Currently   Sexual activity: Yes  Other Topics Concern   Not on file  Social History Narrative   Lives alone.  Significant other, Debbie.    Social Determinants of Health   Financial Resource Strain: Not on file  Food Insecurity: No Food Insecurity (12/03/2022)   Hunger Vital Sign    Worried About Running Out of Food in the Last Year: Never true    Ran Out of Food in the Last Year: Never true  Transportation Needs: No Transportation Needs (12/03/2022)   PRAPARE - Administrator, Civil Service (Medical): No    Lack of Transportation (Non-Medical): No  Physical Activity: Not on file  Stress: Not on file  Social Connections: Not on file  Intimate Partner Violence: Not At Risk (11/10/2022)   Humiliation, Afraid, Rape, and Kick questionnaire    Fear of Current or Ex-Partner: No    Emotionally Abused: No    Physically Abused: No    Sexually Abused: No    Past Surgical History:  Procedure Laterality Date   A/V FISTULAGRAM Right 10/24/2018   Procedure: A/V  FISTULAGRAM;  Surgeon: Renford Dills, MD;  Location: [facility];  Service: Cardiovascular;  Laterality: Right;   A/V FISTULAGRAM Right 11/24/2018   Procedure: A/V FISTULAGRAM;  Surgeon: Renford Dills, MD;  Location: [facility];  Service: Cardiovascular;  Laterality: Right;   A/V FISTULAGRAM Left 07/31/2019   Procedure: A/V FISTULAGRAM;  Surgeon: Renford Dills, MD;  Location: [facility];  Service: Cardiovascular;  Laterality: Left;   A/V FISTULAGRAM Left 12/11/2019   Procedure: A/V FISTULAGRAM;  Surgeon: Renford Dills, MD;  Location: [facility];  Service: Cardiovascular;  Laterality: Left;   A/V SHUNT INTERVENTION Right 02/21/2019   Procedure: A/V SHUNT INTERVENTION;  Surgeon: Renford Dills, MD;  Location: [facility];  Service: Cardiovascular;  Laterality: Right;   APPLICATION OF WOUND VAC Left 10/03/2017   Procedure: APPLICATION OF WOUND VAC;  Surgeon: Annice Needy, MD;  Location: [facility];  Service: General;  Laterality: Left;   APPLICATION OF WOUND VAC Left 10/11/2017   Procedure: APPLICATION OF WOUND VAC;  Surgeon: Renford Dills, MD;  Location: [facility];  Service: Vascular;  Laterality: Left;   APPLICATION OF WOUND VAC Left 10/14/2017   Procedure: WOUND VAC CHANGE;  Surgeon: Renford Dills, MD;  Location: [facility];  Service: Vascular;  Laterality: Left;  left lower leg   APPLICATION OF WOUND VAC Left 10/18/2017   Procedure: WOUND VAC CHANGE;  Surgeon: Renford Dills, MD;  Location: [facility];  Service: Vascular;  Laterality: Left;   APPLICATION OF WOUND VAC Left 10/07/2017   Procedure: APPLICATION OF WOUND VAC;  Surgeon: Renford Dills, MD;  Location: [facility];  Service: Vascular;  Laterality: Left;   APPLICATION OF WOUND VAC Left 11/17/2022   Procedure: Wound vac exchange;  Surgeon: Renford Dills, MD;  Location: [facility];  Service: Vascular;  Laterality: Left;   APPLICATION OF WOUND VAC Left 11/10/2022    Procedure: APPLICATION OF WOUND VAC;  Surgeon: Renford Dills, MD;  Location: [facility];  Service: Vascular;  Laterality: Left;   APPLICATION OF WOUND VAC Left 11/24/2022   Procedure: WOUND VAC REMOVAL;  Surgeon: Renford Dills, MD;  Location: [facility];  Service: Vascular;  Laterality: Left;   APPLICATION OF WOUND VAC Left 11/12/2022  Procedure: APPLICATION OF WOUND VAC;  Surgeon: Renford Dills, MD;  Location: [facility];  Service: Vascular;  Laterality: Left;   AV FISTULA INSERTION W/ RF MAGNETIC GUIDANCE Right 07/19/2018   Procedure: AV FISTULA INSERTION W/RF MAGNETIC GUIDANCE;  Surgeon: Renford Dills, MD;  Location: [facility];  Service: Cardiovascular;  Laterality: Right;   AV FISTULA PLACEMENT Left 05/11/2019   Procedure: ARTERIOVENOUS (AV) FISTULA CREATION (RADIOCEPHALIC );  Surgeon: Renford Dills, MD;  Location: [facility];  Service: Vascular;  Laterality: Left;   AV FISTULA PLACEMENT Left 02/20/2020   Procedure: INSERTION OF ARTERIOVENOUS (AV) GORE-TEX GRAFT LEFT ARM ( FOREARM LOOP );  Surgeon: Renford Dills, MD;  Location: [facility];  Service: Vascular;  Laterality: Left;   CATARACT EXTRACTION W/PHACO Left 11/01/2018   Procedure: CATARACT EXTRACTION PHACO AND INTRAOCULAR LENS PLACEMENT (IOC)-LEFT, DIABETIC-INSULIN DEPENDENT;  Surgeon: Nevada Crane, MD;  Location: [facility];  Service: Ophthalmology;  Laterality: Left;  [place] 00:41.8 CDE 4.61 Fluid Pack Lot # [id number]   CATARACT EXTRACTION W/PHACO Right 04/27/2019   Procedure: CATARACT EXTRACTION PHACO AND INTRAOCULAR LENS PLACEMENT (IOC);  Surgeon: Nevada Crane, MD;  Location: [facility];  Service: Ophthalmology;  Laterality: Right;  [place] 00:34 CDE 1.97 FLUID PACK LOT # [id number]    COLONOSCOPY WITH PROPOFOL N/A 05/12/2020   Procedure: COLONOSCOPY WITH PROPOFOL;  Surgeon: Tol[place], Boykin Nearing, MD;  Location: [facility];  Service: Gastroenterology;  Laterality: N/A;   DIALYSIS/PERMA CATHETER INSERTION N/A  02/23/2019   Procedure: DIALYSIS/PERMA CATHETER INSERTION;  Surgeon: Annice Needy, MD;  Location: [facility];  Service: Cardiovascular;  Laterality: N/A;   DIALYSIS/PERMA CATHETER REMOVAL N/A 04/03/2020   Procedure: DIALYSIS/PERMA CATHETER REMOVAL;  Surgeon: Renford Dills, MD;  Location: [facility];  Service: Cardiovascular;  Laterality: N/A;   DIALYSIS/PERMA CATHETER REMOVAL N/A 02/03/2022   Procedure: DIALYSIS/PERMA CATHETER REMOVAL;  Surgeon: Renford Dills, MD;  Location: [facility];  Service: Cardiovascular;  Laterality: N/A;   I & D EXTREMITY Left 10/11/2017   Procedure: IRRIGATION AND DEBRIDEMENT EXTREMITY;  Surgeon: Renford Dills, MD;  Location: [facility];  Service: Vascular;  Laterality: Left;   INCISION AND DRAINAGE ABSCESS Right 10/07/2017   Procedure: INCISION AND DRAINAGE ABSCESS;  Surgeon: Renford Dills, MD;  Location: [facility];  Service: Vascular;  Laterality: Right;   INCISION AND DRAINAGE OF WOUND Left 11/17/2022   Procedure: DEBRIDEMENT WOUND;  Surgeon: Renford Dills, MD;  Location: [facility];  Service: Vascular;  Laterality: Left;   INCISION AND DRAINAGE OF WOUND Left 11/24/2022   Procedure: DEBRIDEMENT WOUND LEFT LOWER EXTREMITY;  Surgeon: Renford Dills, MD;  Location: [facility];  Service: Vascular;  Laterality: Left;   IRRIGATION AND DEBRIDEMENT ABSCESS Left 10/03/2017   Procedure: IRRIGATION AND DEBRIDEMENT ABSCESS with debridement of skin, soft tissue, muscle 50sq cm;  Surgeon: Annice Needy, MD;  Location: [facility];  Service: General;  Laterality: Left;   LOWER EXTREMITY ANGIOGRAPHY Left 10/15/2022   Procedure: Lower Extremity Angiography;  Surgeon: Renford Dills, MD;  Location: [facility];  Service: Cardiovascular;  Laterality: Left;   TEMPORARY DIALYSIS CATHETER  02/20/2019   Procedure: TEMPORARY DIALYSIS CATHETER;  Surgeon: Renford Dills, MD;  Location: [facility];  Service: Cardiovascular;;   UPPER  EXTREMITY ANGIOGRAPHY Left 09/18/2019   Procedure: UPPER EXTREMITY ANGIOGRAPHY;  Surgeon: Renford Dills, MD;  Location: [facility];  Service: Cardiovascular;  Laterality: Left;  WOUND DEBRIDEMENT Left 11/10/2022   Procedure: DEBRIDEMENT WOUND;  Surgeon: Renford Dills, MD;  Location: [facility];  Service: Vascular;  Laterality: Left;   WOUND DEBRIDEMENT Left 11/12/2022   Procedure: DEBRIDEMENT WOUND;  Surgeon: Renford Dills, MD;  Location: [facility];  Service: Vascular;  Laterality: Left;    Family History  Problem Relation Age of Onset   Diabetes Father    Kidney disease Father    Diabetes Daughter     Allergies  Allergen Reactions   Lisinopril Swelling    Lips mouth    Furosemide Cough    Light headed, blurry vision, weakness.   Latex    Adhesive [Tape] Itching and Rash       Latest Ref Rng & Units 11/25/2022    8:44 AM 11/23/2022    8:38 AM 11/20/2022    2:16 PM  CBC  WBC 4.0 - 10.5 K/uL 4.7  5.7  5.0   Hemoglobin 13.0 - 17.0 g/dL 9.5  9.2  9.6   Hematocrit 39.0 - 52.0 % 30.6  29.0  30.7   Platelets 150 - 400 K/uL 156  161  179       CMP     Component Value Date/Time   NA 137 11/25/2022 0844   K 4.4 11/25/2022 0844   CL 96 (L) 11/25/2022 0844   CO2 25 11/25/2022 0844   GLUCOSE 95 11/25/2022 0844   BUN 67 (H) 11/25/2022 0844   CREATININE 13.65 (H) 11/25/2022 0844   CALCIUM 9.1 11/25/2022 0844   PROT 7.1 11/24/2022 0939   ALBUMIN 3.2 (L) 11/25/2022 0844   AST 16 11/24/2022 0939   ALT 7 11/24/2022 0939   ALKPHOS 46 11/24/2022 0939   BILITOT 0.5 11/24/2022 0939   GFRNONAA 4 (L) 11/25/2022 0844   GFRAA 9 (L) 02/19/2020 1316     No results found.     Assessment & Plan:   1. Venous ulcer of ankle, left (HCC) The patient has had notable deterioration of the wound of his left lower extremity.  Previously it had very good granulation with no foul-smelling odors.  Today however there is a notable film over it and the granulated tissue has  taken on a new color and odor since last visit.  Will plan on debriding the patient having a wound vacuum placed.  The wound currently measures 15 cm x 7 cm x 1 cm.  The patient follows a dialysis diet.  2. Lymphedema Compression is on the patient's wound as part of his wound dressing but he was also urged to continue to elevate his lower extremity as continued dependency can make the swelling and thus increase fluid to the wound bed which can cause infection and other issues.  3. Type 2 diabetes mellitus with hyperglycemia, with long-term current use of insulin (HCC) Continue hypoglycemic medications as already ordered, these medications have been reviewed and there are no changes at this time.  Hgb A1C to be monitored as already arranged by primary service  4. End stage renal disease (HCC) The patient has dialysis of the day of his planned debridement.  We have contacted the dialysis center to arrange for a sooner treatment to ensure that he does not miss any.   Current Outpatient Medications on File Prior to Visit  Medication Sig Dispense Refill   acetaminophen (TYLENOL) 325 MG tablet Take 2 tablets (650 mg total) by mouth every 6 (six) hours as needed for mild pain (or Fever >/= 101).  albuterol (VENTOLIN HFA) 108 (90 Base) MCG/ACT inhaler Inhale 2 puffs into the lungs every 6 (six) hours as needed.     allopurinol (ZYLOPRIM) 100 MG tablet Take 100 mg by mouth daily.     amLODipine (NORVASC) 10 MG tablet Take 5 mg by mouth daily as needed (if bp is 190/90 or higher).      apixaban (ELIQUIS) 5 MG TABS tablet Take 1 tablet (5 mg total) by mouth 2 (two) times daily. 60 tablet 3   atenolol (TENORMIN) 50 MG tablet Take 25 mg by mouth daily as needed (if bp is 190/90 or higher).      atorvastatin (LIPITOR) 10 MG tablet Take 1 tablet (10 mg total) by mouth daily. Further refills per primary care 30 tablet 2   B Complex-C-Folic Acid (RENA-VITE RX) 1 MG TABS Take 1 tablet by mouth 3 (three)  times a week.      bromocriptine (PARLODEL) 2.5 MG tablet TAKE 1 TABLET BY MOUTH AT BEDTIME. 90 tablet 3   calcium acetate (PHOSLO) 667 MG capsule Take 1,334-2,001 mg by mouth See admin instructions. Take 2001 mg by mouth with each meal & take 1334 mg by mouth with each snack.     cinacalcet (SENSIPAR) 30 MG tablet Take 30 mg by mouth daily.     CINNAMON PO Take 500 mg by mouth 2 (two) times daily.      clopidogrel (PLAVIX) 75 MG tablet Take 1 tablet (75 mg total) by mouth daily. 30 tablet 4   ibuprofen (ADVIL) 200 MG tablet Take 200 mg by mouth every 6 (six) hours as needed for headache or moderate pain.     Insulin Glargine (LANTUS SOLOSTAR) 100 UNIT/ML Solostar Pen Inject 15 Units into the skin at bedtime.      lidocaine-prilocaine (EMLA) cream Apply 1 application topically as needed (port access).     loratadine (CLARITIN) 10 MG tablet Take 10 mg by mouth daily as needed for allergies.     melatonin 5 MG TABS Take 5 mg by mouth at bedtime as needed (sleep).      Multiple Vitamin (MULTIVITAMIN WITH MINERALS) TABS tablet Take 1 tablet by mouth 3 (three) times a week. Men's Multivitamin     Multiple Vitamins-Minerals (EYE HEALTH PO) Take 1 capsule by mouth daily.      oxyCODONE (OXY IR/ROXICODONE) 5 MG immediate release tablet Take 1 tablet (5 mg total) by mouth every 6 (six) hours as needed for moderate pain or severe pain. 30 tablet 0   Polyethyl Glycol-Propyl Glycol (SYSTANE OP) Place 1 drop into both eyes daily as needed (dry eyes).     sildenafil (VIAGRA) 100 MG tablet Take 1 tablet (100 mg total) by mouth daily as needed for erectile dysfunction. 30 tablet 3   traZODone (DESYREL) 50 MG tablet Take 50 mg by mouth at bedtime.     No current facility-administered medications on file prior to visit.    There are no Patient Instructions on file for this visit. No follow-ups on file.   Georgiana Spinner, NP

## 2023-01-05 ENCOUNTER — Telehealth (INDEPENDENT_AMBULATORY_CARE_PROVIDER_SITE_OTHER): Payer: Self-pay

## 2023-01-05 ENCOUNTER — Ambulatory Visit (INDEPENDENT_AMBULATORY_CARE_PROVIDER_SITE_OTHER): Payer: Medicare HMO | Admitting: Nurse Practitioner

## 2023-01-05 ENCOUNTER — Other Ambulatory Visit (INDEPENDENT_AMBULATORY_CARE_PROVIDER_SITE_OTHER): Payer: Self-pay | Admitting: Nurse Practitioner

## 2023-01-05 DIAGNOSIS — I83023 Varicose veins of left lower extremity with ulcer of ankle: Secondary | ICD-10-CM

## 2023-01-05 DIAGNOSIS — N2581 Secondary hyperparathyroidism of renal origin: Secondary | ICD-10-CM | POA: Diagnosis not present

## 2023-01-05 DIAGNOSIS — Z992 Dependence on renal dialysis: Secondary | ICD-10-CM | POA: Diagnosis not present

## 2023-01-05 DIAGNOSIS — N186 End stage renal disease: Secondary | ICD-10-CM | POA: Diagnosis not present

## 2023-01-05 NOTE — Telephone Encounter (Signed)
Spoke with the patient's mother after not being able to contact him. Patient is scheduled with Dr. Gilda Crease for a wound debridement  and vac placement on 01/06/23 at [facility]. Pre-op is scheduled 2 hours before the surgery at pre-admission. Pre- surgical instructions were discussed and patient's mother stated she understood and would make sure the patient had the information. [employer] was contacted for the wound vac.

## 2023-01-06 ENCOUNTER — Encounter: Payer: Self-pay | Admitting: Vascular Surgery

## 2023-01-06 ENCOUNTER — Encounter
Admission: RE | Disposition: A | Discharge status: Home / Self Care | Payer: Self-pay | Source: Home / Self Care | Attending: Vascular Surgery

## 2023-01-06 ENCOUNTER — Ambulatory Visit: Payer: Medicare HMO | Admitting: Urgent Care

## 2023-01-06 ENCOUNTER — Telehealth (INDEPENDENT_AMBULATORY_CARE_PROVIDER_SITE_OTHER): Payer: Self-pay

## 2023-01-06 ENCOUNTER — Ambulatory Visit
Admission: RE | Admit: 2023-01-06 | Discharge: 2023-01-06 | Disposition: A | Discharge status: Home / Self Care | Payer: Medicare HMO | Attending: Vascular Surgery | Admitting: Vascular Surgery

## 2023-01-06 ENCOUNTER — Other Ambulatory Visit: Payer: Self-pay

## 2023-01-06 DIAGNOSIS — Z794 Long term (current) use of insulin: Secondary | ICD-10-CM | POA: Insufficient documentation

## 2023-01-06 DIAGNOSIS — E1122 Type 2 diabetes mellitus with diabetic chronic kidney disease: Secondary | ICD-10-CM | POA: Diagnosis not present

## 2023-01-06 DIAGNOSIS — I120 Hypertensive chronic kidney disease with stage 5 chronic kidney disease or end stage renal disease: Secondary | ICD-10-CM | POA: Insufficient documentation

## 2023-01-06 DIAGNOSIS — L97529 Non-pressure chronic ulcer of other part of left foot with unspecified severity: Secondary | ICD-10-CM

## 2023-01-06 DIAGNOSIS — D631 Anemia in chronic kidney disease: Secondary | ICD-10-CM | POA: Diagnosis not present

## 2023-01-06 DIAGNOSIS — Z01818 Encounter for other preprocedural examination: Secondary | ICD-10-CM

## 2023-01-06 DIAGNOSIS — I872 Venous insufficiency (chronic) (peripheral): Secondary | ICD-10-CM | POA: Diagnosis not present

## 2023-01-06 DIAGNOSIS — L97329 Non-pressure chronic ulcer of left ankle with unspecified severity: Secondary | ICD-10-CM | POA: Insufficient documentation

## 2023-01-06 DIAGNOSIS — E1165 Type 2 diabetes mellitus with hyperglycemia: Secondary | ICD-10-CM | POA: Insufficient documentation

## 2023-01-06 DIAGNOSIS — N186 End stage renal disease: Secondary | ICD-10-CM | POA: Insufficient documentation

## 2023-01-06 DIAGNOSIS — E1151 Type 2 diabetes mellitus with diabetic peripheral angiopathy without gangrene: Secondary | ICD-10-CM | POA: Diagnosis not present

## 2023-01-06 DIAGNOSIS — L97429 Non-pressure chronic ulcer of left heel and midfoot with unspecified severity: Secondary | ICD-10-CM | POA: Diagnosis not present

## 2023-01-06 DIAGNOSIS — E11621 Type 2 diabetes mellitus with foot ulcer: Secondary | ICD-10-CM | POA: Diagnosis not present

## 2023-01-06 HISTORY — PX: WOUND DEBRIDEMENT: SHX247

## 2023-01-06 HISTORY — PX: APPLICATION OF WOUND VAC: SHX5189

## 2023-01-06 LAB — TYPE AND SCREEN
ABO/RH(D): A POS
Antibody Screen: NEGATIVE

## 2023-01-06 LAB — CBC WITH DIFFERENTIAL/PLATELET
Abs Immature Granulocytes: 0.07 10*3/uL (ref 0.00–0.07)
Basophils Absolute: 0.1 10*3/uL (ref 0.0–0.1)
Basophils Relative: 0 %
Eosinophils Absolute: 0 10*3/uL (ref 0.0–0.5)
Eosinophils Relative: 0 %
HCT: 35.9 % — ABNORMAL LOW (ref 39.0–52.0)
Hemoglobin: 11.3 g/dL — ABNORMAL LOW (ref 13.0–17.0)
Immature Granulocytes: 0 %
Lymphocytes Relative: 5 %
Lymphs Abs: 0.9 10*3/uL (ref 0.7–4.0)
MCH: 30.1 pg (ref 26.0–34.0)
MCHC: 31.5 g/dL (ref 30.0–36.0)
MCV: 95.7 fL (ref 80.0–100.0)
Monocytes Absolute: 1.2 10*3/uL — ABNORMAL HIGH (ref 0.1–1.0)
Monocytes Relative: 7 %
Neutro Abs: 14.8 10*3/uL — ABNORMAL HIGH (ref 1.7–7.7)
Neutrophils Relative %: 88 %
Platelets: 235 10*3/uL (ref 150–400)
RBC: 3.75 MIL/uL — ABNORMAL LOW (ref 4.22–5.81)
RDW: 14.1 % (ref 11.5–15.5)
WBC: 17 10*3/uL — ABNORMAL HIGH (ref 4.0–10.5)
nRBC: 0 % (ref 0.0–0.2)

## 2023-01-06 LAB — BASIC METABOLIC PANEL
Anion gap: 13 (ref 5–15)
BUN: 37 mg/dL — ABNORMAL HIGH (ref 6–20)
CO2: 28 mmol/L (ref 22–32)
Calcium: 9.9 mg/dL (ref 8.9–10.3)
Chloride: 96 mmol/L — ABNORMAL LOW (ref 98–111)
Creatinine, Ser: 9.07 mg/dL — ABNORMAL HIGH (ref 0.61–1.24)
GFR, Estimated: 6 mL/min — ABNORMAL LOW (ref >60)
Glucose, Bld: 100 mg/dL — ABNORMAL HIGH (ref 70–99)
Potassium: 3.5 mmol/L (ref 3.5–5.1)
Sodium: 137 mmol/L (ref 135–145)

## 2023-01-06 LAB — GLUCOSE, CAPILLARY: Glucose-Capillary: 96 mg/dL (ref 70–99)

## 2023-01-06 SURGERY — DEBRIDEMENT, WOUND
Anesthesia: General | Laterality: Left

## 2023-01-06 MED ORDER — CEFAZOLIN SODIUM-DEXTROSE 2-4 GM/100ML-% IV SOLN
INTRAVENOUS | Status: AC
  Filled 2023-01-06: qty 100

## 2023-01-06 MED ORDER — CHLORHEXIDINE GLUCONATE 0.12 % MT SOLN
OROMUCOSAL | Status: AC
  Filled 2023-01-06: qty 15

## 2023-01-06 MED ORDER — CHLORHEXIDINE GLUCONATE CLOTH 2 % EX PADS: 6.0000 | MEDICATED_PAD | Freq: Once | CUTANEOUS | Status: DC

## 2023-01-06 MED ORDER — 0.9 % SODIUM CHLORIDE (POUR BTL) OPTIME
TOPICAL | Status: DC | PRN
  Administered 2023-01-06: 1000 mL

## 2023-01-06 MED ORDER — LABETALOL HCL 5 MG/ML IV SOLN
INTRAVENOUS | Status: AC
  Filled 2023-01-06: qty 4

## 2023-01-06 MED ORDER — LABETALOL HCL 5 MG/ML IV SOLN
INTRAVENOUS | Status: DC | PRN
  Administered 2023-01-06: 10 mg via INTRAVENOUS

## 2023-01-06 MED ORDER — DEXAMETHASONE SODIUM PHOSPHATE 10 MG/ML IJ SOLN
INTRAMUSCULAR | Status: DC | PRN
  Administered 2023-01-06: 5 mg via INTRAVENOUS

## 2023-01-06 MED ORDER — ORAL CARE MOUTH RINSE: 15.0000 mL | Freq: Once | OROMUCOSAL | Status: AC

## 2023-01-06 MED ORDER — VASOPRESSIN 20 UNIT/ML IV SOLN
INTRAVENOUS | Status: DC | PRN
  Administered 2023-01-06: 1 [IU] via INTRAVENOUS

## 2023-01-06 MED ORDER — FENTANYL CITRATE (PF) 100 MCG/2ML IJ SOLN
INTRAMUSCULAR | Status: AC
  Filled 2023-01-06: qty 2

## 2023-01-06 MED ORDER — OXYCODONE HCL 5 MG/5ML PO SOLN: 5.0000 mg | Freq: Once | ORAL | Status: DC | PRN

## 2023-01-06 MED ORDER — FENTANYL CITRATE (PF) 100 MCG/2ML IJ SOLN
INTRAMUSCULAR | Status: DC | PRN
  Administered 2023-01-06: 50 ug via INTRAVENOUS

## 2023-01-06 MED ORDER — OXYCODONE HCL 5 MG PO TABS: 5.0000 mg | ORAL_TABLET | Freq: Once | ORAL | Status: DC | PRN

## 2023-01-06 MED ORDER — ACETAMINOPHEN 10 MG/ML IV SOLN: 1000.0000 mg | Freq: Once | INTRAVENOUS | Status: DC | PRN

## 2023-01-06 MED ORDER — PHENYLEPHRINE 80 MCG/ML (10ML) SYRINGE FOR IV PUSH (FOR BLOOD PRESSURE SUPPORT)
PREFILLED_SYRINGE | INTRAVENOUS | Status: DC | PRN
  Administered 2023-01-06: 160 ug via INTRAVENOUS

## 2023-01-06 MED ORDER — FENTANYL CITRATE (PF) 100 MCG/2ML IJ SOLN: 25.0000 ug | INTRAMUSCULAR | Status: DC | PRN

## 2023-01-06 MED ORDER — EPHEDRINE SULFATE (PRESSORS) 50 MG/ML IJ SOLN
INTRAMUSCULAR | Status: DC | PRN
  Administered 2023-01-06: 5 mg via INTRAVENOUS

## 2023-01-06 MED ORDER — PROPOFOL 10 MG/ML IV BOLUS
INTRAVENOUS | Status: DC | PRN
  Administered 2023-01-06: 200 mg via INTRAVENOUS

## 2023-01-06 MED ORDER — LIDOCAINE HCL (CARDIAC) PF 100 MG/5ML IV SOSY
PREFILLED_SYRINGE | INTRAVENOUS | Status: DC | PRN
  Administered 2023-01-06: 100 mg via INTRAVENOUS

## 2023-01-06 MED ORDER — LACTATED RINGERS IV SOLN: INTRAVENOUS | Status: DC

## 2023-01-06 MED ORDER — MIDAZOLAM HCL 2 MG/2ML IJ SOLN
INTRAMUSCULAR | Status: AC
  Filled 2023-01-06: qty 2

## 2023-01-06 MED ORDER — FAMOTIDINE 20 MG PO TABS
ORAL_TABLET | ORAL | Status: AC
  Filled 2023-01-06: qty 1

## 2023-01-06 MED ORDER — CHLORHEXIDINE GLUCONATE 0.12 % MT SOLN
15.0000 mL | Freq: Once | OROMUCOSAL | Status: AC
  Administered 2023-01-06: 15 mL via OROMUCOSAL

## 2023-01-06 MED ORDER — ONDANSETRON HCL 4 MG/2ML IJ SOLN: 4.0000 mg | Freq: Once | INTRAMUSCULAR | Status: DC | PRN

## 2023-01-06 MED ORDER — SODIUM CHLORIDE 0.9 % IV SOLN: INTRAVENOUS | Status: DC

## 2023-01-06 MED ORDER — GLYCOPYRROLATE 0.2 MG/ML IJ SOLN
INTRAMUSCULAR | Status: DC | PRN
  Administered 2023-01-06: .2 mg via INTRAVENOUS

## 2023-01-06 MED ORDER — ONDANSETRON HCL 4 MG/2ML IJ SOLN
INTRAMUSCULAR | Status: DC | PRN
  Administered 2023-01-06 (×2): 4 mg via INTRAVENOUS

## 2023-01-06 MED ORDER — PROPOFOL 1000 MG/100ML IV EMUL
INTRAVENOUS | Status: AC
  Filled 2023-01-06: qty 100

## 2023-01-06 MED ORDER — ONDANSETRON HCL 4 MG/2ML IJ SOLN: 4.0000 mg | Freq: Four times a day (QID) | INTRAMUSCULAR | Status: DC | PRN

## 2023-01-06 MED ORDER — SUCCINYLCHOLINE CHLORIDE 200 MG/10ML IV SOSY
PREFILLED_SYRINGE | INTRAVENOUS | Status: DC | PRN
  Administered 2023-01-06: 120 mg via INTRAVENOUS

## 2023-01-06 MED ORDER — CEFAZOLIN SODIUM-DEXTROSE 1-4 GM/50ML-% IV SOLN
INTRAVENOUS | Status: AC
  Filled 2023-01-06: qty 50

## 2023-01-06 MED ORDER — ESMOLOL HCL 100 MG/10ML IV SOLN
INTRAVENOUS | Status: DC | PRN
  Administered 2023-01-06: 20 mg via INTRAVENOUS

## 2023-01-06 MED ORDER — CEFAZOLIN SODIUM-DEXTROSE 2-4 GM/100ML-% IV SOLN
2.0000 g | INTRAVENOUS | Status: AC
  Administered 2023-01-06: 1 g via INTRAVENOUS
  Administered 2023-01-06: 2 g via INTRAVENOUS

## 2023-01-06 MED ORDER — HYDROMORPHONE HCL 1 MG/ML IJ SOLN: 1.0000 mg | Freq: Once | INTRAMUSCULAR | Status: DC | PRN

## 2023-01-06 MED ORDER — FAMOTIDINE 20 MG PO TABS
20.0000 mg | ORAL_TABLET | Freq: Once | ORAL | Status: AC
  Administered 2023-01-06: 20 mg via ORAL

## 2023-01-06 SURGICAL SUPPLY — 42 items
AGENT HMST PWDR HDRLZ BVN CLGN (Miscellaneous) ×6 IMPLANT
APL PRP STRL LF DISP 70% ISPRP (MISCELLANEOUS) ×1
BNDG CMPR 5X6 CHSV STRCH STRL (GAUZE/BANDAGES/DRESSINGS) ×1
BNDG CMPR 75X21 PLY HI ABS (MISCELLANEOUS)
BNDG CMPR STD VLCR NS LF 5.8X4 (GAUZE/BANDAGES/DRESSINGS) ×1
BNDG COHESIVE 6X5 TAN ST LF (GAUZE/BANDAGES/DRESSINGS) ×1 IMPLANT
BNDG ELASTIC 4X5.8 VLCR NS LF (GAUZE/BANDAGES/DRESSINGS) IMPLANT
CANISTER WOUND CARE 500ML ATS (WOUND CARE) IMPLANT
CHLORAPREP W/TINT 26 (MISCELLANEOUS) ×1 IMPLANT
COLLAGEN CELLERATERX 1 GRAM (Miscellaneous) IMPLANT
DRAPE EXTREMITY 106X87X128.5 (DRAPES) IMPLANT
DRAPE INCISE IOBAN 66X45 STRL (DRAPES) IMPLANT
DRSG EMULSION OIL 3X3 NADH (GAUZE/BANDAGES/DRESSINGS) IMPLANT
DRSG EMULSION OIL 3X8 NADH (GAUZE/BANDAGES/DRESSINGS) IMPLANT
DRSG VAC GRANUFOAM LG (GAUZE/BANDAGES/DRESSINGS) IMPLANT
DRSG VAC GRANUFOAM MED (GAUZE/BANDAGES/DRESSINGS) IMPLANT
ELECT REM PT RETURN 9FT ADLT (ELECTROSURGICAL) ×1
ELECTRODE REM PT RTRN 9FT ADLT (ELECTROSURGICAL) ×1 IMPLANT
GAUZE SPONGE 4X4 12PLY STRL (GAUZE/BANDAGES/DRESSINGS) IMPLANT
GAUZE STRETCH 2X75IN STRL (MISCELLANEOUS) IMPLANT
GLOVE BIO SURGEON STRL SZ7 (GLOVE) ×1 IMPLANT
GLOVE SURG SYN 8.0 (GLOVE) ×1 IMPLANT
GLOVE SURG SYN 8.0 PF PI (GLOVE) ×1 IMPLANT
GOWN STRL REUS W/ TWL LRG LVL3 (GOWN DISPOSABLE) ×2 IMPLANT
GOWN STRL REUS W/ TWL XL LVL3 (GOWN DISPOSABLE) ×1 IMPLANT
GOWN STRL REUS W/TWL LRG LVL3 (GOWN DISPOSABLE) ×2
GOWN STRL REUS W/TWL XL LVL3 (GOWN DISPOSABLE) ×1
HANDPIECE VERSAJET DEBRIDEMENT (MISCELLANEOUS) IMPLANT
KIT TURNOVER KIT A (KITS) ×1 IMPLANT
LABEL OR SOLS (LABEL) ×1 IMPLANT
MANIFOLD NEPTUNE II (INSTRUMENTS) ×1 IMPLANT
NS IRRIG 500ML POUR BTL (IV SOLUTION) ×1 IMPLANT
PACK EXTREMITY ARMC (MISCELLANEOUS) ×1 IMPLANT
PAD ABD DERMACEA PRESS 5X9 (GAUZE/BANDAGES/DRESSINGS) IMPLANT
PAD CAST CTTN 4X4 STRL (SOFTGOODS) IMPLANT
PAD PREP OB/GYN DISP 24X41 (PERSONAL CARE ITEMS) ×1 IMPLANT
PADDING CAST COTTON 4X4 STRL (SOFTGOODS) ×1
SOL PREP PVP 2OZ (MISCELLANEOUS) ×1
SOLUTION PREP PVP 2OZ (MISCELLANEOUS) ×1 IMPLANT
STOCKINETTE IMPERV 14X48 (MISCELLANEOUS) ×1 IMPLANT
TRAP FLUID SMOKE EVACUATOR (MISCELLANEOUS) ×1 IMPLANT
WATER STERILE IRR 500ML POUR (IV SOLUTION) ×1 IMPLANT

## 2023-01-06 NOTE — Telephone Encounter (Signed)
Patient's wound vac is through [employer] and they delivered the vac to our office at 11:50 am this morning for the patient's surgery at 12:00 pm with Dr. Gilda Crease. I was contacted by Dr. Gilda Crease stating the canister connector  was not with the wound vac supplies. I contacted [employer] again to request that canister connectors be brought to our office and they have been dropped off and taken to the hospital.

## 2023-01-06 NOTE — Op Note (Signed)
    OPERATIVE NOTE   PROCEDURE: 1.  Excisional debridement left ankle wound. 2.  Excisional debridement dorsal left foot wound. 3.  Application of a wound VAC to the ankle wound  PRE-OPERATIVE DIAGNOSIS: Venous ulcer left ankle and left dorsal foot  POST-OPERATIVE DIAGNOSIS: Same  SURGEON: [name]  ASSISTANT(S): Brien pace, NP  ANESTHESIA: general  ESTIMATED BLOOD LOSS: 15 cc  FINDING(S): Healthy granulation below the debrided tissue  SPECIMEN(S): None sent  INDICATIONS:   [name] is a 52 y.o. male who presents with worsening of his left ankle and left foot wound.  It has developed a foul odor and a increased nonviable tissue associated with increased drainage.  Risk and benefits for debridement and application of a wound VAC are reviewed with the patient all questions were answered patient agrees to proceed.  DESCRIPTION: After full informed written consent was obtained from the patient, the patient was brought back to the operating room and placed supine upon the operating table.  Prior to induction, the patient received IV antibiotics.   After obtaining adequate anesthesia, the patient was then prepped and draped in the standard fashion for a debridement of his left leg wounds.  Using the Versajet's on setting 3 the entire surface of the ankle wound is debrided.  Once healthy granulation tissue was now visible across the entire wound bed light pressure was held for 5 minutes.  Hemostasis was then achieved with Bovie cautery.  The wound was then measured and measures 15 cm x 12 cm (180 sq cm).  The dorsal foot wound is then treated in the same fashion using the Versajet on setting 3 debriding it down to viable healthy tissue with bleeding.  Hemostasis is achieved with direct pressure and then Bovie cautery.  The wound was then measured and it is 3 cm x 3 cm (9 sq cm).  Cellerate is then placed in the bed of both wounds a total of 6 g is used.  A Adaptic is then  placed.  A wound VAC is then placed on the ankle wound with a black sponge.  The dorsal foot wound is treated with gauze over the Adaptic followed by Curlex and an Ace wrap.   The patient tolerated this procedure well.   COMPLICATIONS: None  CONDITION: Velna Hatchet [facility]  Office: [phone]   01/06/2023, 1:52 PM

## 2023-01-06 NOTE — Anesthesia Postprocedure Evaluation (Signed)
Anesthesia Post Note  Patient: [name]  Procedure(s) Performed: DEBRIDEMENT WOUND (Left) APPLICATION OF WOUND VAC (Left)  Patient location during evaluation: PACU Anesthesia Type: General Level of consciousness: awake and alert, oriented and patient cooperative Pain management: pain level controlled Vital Signs Assessment: post-procedure vital signs reviewed and stable Respiratory status: spontaneous breathing, nonlabored ventilation and respiratory function stable Cardiovascular status: blood pressure returned to baseline and stable Postop Assessment: adequate PO intake Anesthetic complications: no   No notable events documented.   Last Vitals:  Vitals:   01/06/23 1415 01/06/23 1430  BP: (!) 145/38 (!) 153/61  Pulse: 76 73  Resp: (!) 24 (!) 23  Temp:  (!) 36.3 C  SpO2: 97% 100%    Last Pain:  Vitals:   01/06/23 1430  TempSrc:   PainSc: 0-No pain                 Reed Breech

## 2023-01-06 NOTE — Discharge Instructions (Signed)

## 2023-01-06 NOTE — Anesthesia Procedure Notes (Signed)
Procedure Name: Intubation Date/Time: 01/06/2023 12:34 PM  Performed by: Mohammed Kindle, CRNAPre-anesthesia Checklist: Patient identified, Emergency Drugs available, Suction available and Patient being monitored Patient Re-evaluated:Patient Re-evaluated prior to induction Oxygen Delivery Method: Circle system utilized Preoxygenation: Pre-oxygenation with 100% oxygen Induction Type: IV induction Ventilation: Mask ventilation without difficulty Laryngoscope Size: McGraph and 4 Grade View: Grade I Tube type: Oral Number of attempts: 1 Airway Equipment and Method: Stylet and Oral airway Placement Confirmation: ETT inserted through vocal cords under direct vision, positive ETCO2, breath sounds checked- equal and bilateral and CO2 detector Secured at: 21 cm Tube secured with: Tape Dental Injury: Teeth and Oropharynx as per pre-operative assessment

## 2023-01-06 NOTE — Anesthesia Preprocedure Evaluation (Addendum)
Anesthesia Evaluation  Patient identified by MRN, date of birth, ID band Patient awake    Reviewed: Allergy & Precautions, NPO status , Patient's Chart, lab work & pertinent test results  History of Anesthesia Complications Negative for: history of anesthetic complications  Airway Mallampati: III   Neck ROM: Full    Dental  (+) Partial Upper   Pulmonary sleep apnea , former smoker (quit 2019)   Pulmonary exam normal breath sounds clear to auscultation       Cardiovascular hypertension, + Peripheral Vascular Disease (on Plavix and Eliquis)  Normal cardiovascular exam Rhythm:Regular Rate:Normal  ECG 11/10/22: normal   Neuro/Psych Pituitary microadenoma    GI/Hepatic negative GI ROS,,,  Endo/Other  diabetes, Type 2, Insulin Dependent  Class 3 obesity  Renal/GU ESRF and DialysisRenal disease (last HD 01/05/23)     Musculoskeletal   Abdominal   Peds  Hematology  (+) Blood dyscrasia, anemia   Anesthesia Other Findings   Reproductive/Obstetrics                             Anesthesia Physical Anesthesia Plan  ASA: 3  Anesthesia Plan: General   Post-op Pain Management:    Induction: Intravenous  PONV Risk Score and Plan: 2 and Ondansetron, Dexamethasone and Treatment may vary due to age or medical condition  Airway Management Planned: LMA  Additional Equipment:   Intra-op Plan:   Post-operative Plan: Extubation in OR  Informed Consent: I have reviewed the patients History and Physical, chart, labs and discussed the procedure including the risks, benefits and alternatives for the proposed anesthesia with the patient or authorized representative who has indicated his/her understanding and acceptance.     Dental advisory given  Plan Discussed with: CRNA  Anesthesia Plan Comments: (Patient consented for risks of anesthesia including but not limited to:  - adverse reactions to  medications - damage to eyes, teeth, lips or other oral mucosa - nerve damage due to positioning  - sore throat or hoarseness - damage to heart, brain, nerves, lungs, other parts of body or loss of life  Informed patient about role of CRNA in peri- and intra-operative care.  Patient voiced understanding.)        Anesthesia Quick Evaluation

## 2023-01-06 NOTE — Interval H&P Note (Signed)
History and Physical Interval Note:  01/06/2023 11:38 AM  [name]  has presented today for surgery, with the diagnosis of NON-PRESSURE CHRONIC ULCER OF UNSPECIFIED LOWER LEG WITH NECROSIS OF MUSCLE, CELLULITIS OF LEFT LOWER LIMB.  The various methods of treatment have been discussed with the patient and family. After consideration of risks, benefits and other options for treatment, the patient has consented to  Procedure(s): DEBRIDEMENT WOUND (Left) APPLICATION OF WOUND VAC (Left) as a surgical intervention.  The patient's history has been reviewed, patient examined, no change in status, stable for surgery.  I have reviewed the patient's chart and labs.  Questions were answered to the patient's satisfaction.     [name]

## 2023-01-06 NOTE — Transfer of Care (Signed)
Immediate Anesthesia Transfer of Care Note  Patient: Lowery A Menn  Procedure(s) Performed: DEBRIDEMENT WOUND (Left) APPLICATION OF WOUND VAC (Left)  Patient Location: PACU  Anesthesia Type:General  Level of Consciousness: awake, drowsy, and patient cooperative  Airway & Oxygen Therapy: Patient Spontanous Breathing and Patient connected to face mask oxygen  Post-op Assessment: Report given to RN and Post -op Vital signs reviewed and stable  Post vital signs: Reviewed and stable  Last Vitals:  Vitals Value Taken Time  BP 157/77 (89) 1412  Temp 97.4 1412  Pulse 77 01/06/23 1412  Resp 21 01/06/23 1412  SpO2 97 % 01/06/23 1412  Vitals shown include unvalidated device data.  Last Pain:  Vitals:   01/06/23 1122  TempSrc: Temporal  PainSc: 4          Complications: No notable events documented.

## 2023-01-07 ENCOUNTER — Encounter: Payer: Self-pay | Admitting: Vascular Surgery

## 2023-01-07 ENCOUNTER — Ambulatory Visit (INDEPENDENT_AMBULATORY_CARE_PROVIDER_SITE_OTHER): Payer: Medicare HMO | Admitting: Nurse Practitioner

## 2023-01-07 ENCOUNTER — Ambulatory Visit: Payer: Self-pay | Admitting: *Deleted

## 2023-01-07 LAB — AEROBIC CULTURE

## 2023-01-07 NOTE — Patient Outreach (Signed)
  Care Coordination   Follow Up Visit Note   01/07/2023 Name: [name] MRN: [id number] DOB: November 06, 1970  [name] is a 52 y.o. year old male who sees [name], [name], MD for primary care. I spoke with  [name] by phone today.  What matters to the patients health and wellness today?  Continue wound healing and bring glucose back down.     Goals Addressed             This Visit's Progress    Effective management of DM   On track    Care Coordination Interventions: Provided education to patient about basic DM disease process Reviewed medications with patient and discussed importance of medication adherence Counseled on importance of regular laboratory monitoring as prescribed Reviewed scheduled/upcoming provider appointments including: endocrinology on 6/24 Advised patient, providing education and rationale, to check cbg daily and record, calling provider for findings outside established parameters Report he is adherent to HD on Tuesdays, Thursdays, and Saturdays Discussed current A1C of 8.8, goal of less than 7      Effective wound healing of leg wound   On track    Care Coordination Interventions: Evaluation of current treatment plan related to leg wound and patient's adherence to plan as established by provider Advised patient to call [facility] agency to discuss concerns for treatment (regarding availability of supplies) Reviewed scheduled/upcoming provider appointments including HH wound care nursing visit on Mondays and Fridays, vascular visits every Wednesday Discussed plans with patient for ongoing care management follow up and provided patient with direct contact information for care management team         SDOH assessments and interventions completed:  No     Care Coordination Interventions:  Yes, provided   Interventions Today    Flowsheet Row Most Recent Value  Chronic Disease   Chronic disease during today's visit Diabetes, Other  [wound healing]   General Interventions   General Interventions Discussed/Reviewed General Interventions Reviewed, Annual Foot Exam, Doctor Visits, Community Resources  Onset debridement of foot/ankle wound yesterday, now has wound vac.  Continues to have [facility] services for wound care]  Doctor Visits Discussed/Reviewed Doctor Visits Reviewed, Specialist  [facility] 5/17, urology 5/20, vascular again on 6/20, and endocrinology on 6/24]  PCP/Specialist Visits Compliance with follow-up visit  [HD 3 days a week]  Education Interventions   Education Provided Provided Education  Provided Verbal Education On Nutrition, When to see the doctor, Blood Sugar Monitoring  [blood sugar today was 317, state he was given Dextrose yesterday during procedure, expecting glucose levels to come back down.]       Follow up plan: Follow up call scheduled for 6/5    Encounter Outcome:  Pt. Visit Completed   Kemper Durie, RN, MSN, [facility] Care Management Care Management Coordinator [phone]

## 2023-01-08 DIAGNOSIS — E1165 Type 2 diabetes mellitus with hyperglycemia: Secondary | ICD-10-CM | POA: Diagnosis not present

## 2023-01-08 DIAGNOSIS — I872 Venous insufficiency (chronic) (peripheral): Secondary | ICD-10-CM | POA: Diagnosis not present

## 2023-01-08 DIAGNOSIS — N2581 Secondary hyperparathyroidism of renal origin: Secondary | ICD-10-CM | POA: Diagnosis not present

## 2023-01-08 DIAGNOSIS — N186 End stage renal disease: Secondary | ICD-10-CM | POA: Diagnosis not present

## 2023-01-08 DIAGNOSIS — I120 Hypertensive chronic kidney disease with stage 5 chronic kidney disease or end stage renal disease: Secondary | ICD-10-CM | POA: Diagnosis not present

## 2023-01-08 DIAGNOSIS — Z992 Dependence on renal dialysis: Secondary | ICD-10-CM | POA: Diagnosis not present

## 2023-01-08 DIAGNOSIS — I499 Cardiac arrhythmia, unspecified: Secondary | ICD-10-CM | POA: Diagnosis not present

## 2023-01-08 DIAGNOSIS — I890 Lymphedema, not elsewhere classified: Secondary | ICD-10-CM | POA: Diagnosis not present

## 2023-01-08 DIAGNOSIS — J45909 Unspecified asthma, uncomplicated: Secondary | ICD-10-CM | POA: Diagnosis not present

## 2023-01-08 DIAGNOSIS — E1152 Type 2 diabetes mellitus with diabetic peripheral angiopathy with gangrene: Secondary | ICD-10-CM | POA: Diagnosis not present

## 2023-01-08 DIAGNOSIS — E1122 Type 2 diabetes mellitus with diabetic chronic kidney disease: Secondary | ICD-10-CM | POA: Diagnosis not present

## 2023-01-09 ENCOUNTER — Other Ambulatory Visit (INDEPENDENT_AMBULATORY_CARE_PROVIDER_SITE_OTHER): Payer: Self-pay | Admitting: Vascular Surgery

## 2023-01-10 ENCOUNTER — Other Ambulatory Visit (INDEPENDENT_AMBULATORY_CARE_PROVIDER_SITE_OTHER): Payer: Self-pay | Admitting: Vascular Surgery

## 2023-01-11 ENCOUNTER — Telehealth (INDEPENDENT_AMBULATORY_CARE_PROVIDER_SITE_OTHER): Payer: Self-pay

## 2023-01-11 DIAGNOSIS — N186 End stage renal disease: Secondary | ICD-10-CM | POA: Diagnosis not present

## 2023-01-11 DIAGNOSIS — I872 Venous insufficiency (chronic) (peripheral): Secondary | ICD-10-CM | POA: Diagnosis not present

## 2023-01-11 DIAGNOSIS — I499 Cardiac arrhythmia, unspecified: Secondary | ICD-10-CM | POA: Diagnosis not present

## 2023-01-11 DIAGNOSIS — I890 Lymphedema, not elsewhere classified: Secondary | ICD-10-CM | POA: Diagnosis not present

## 2023-01-11 DIAGNOSIS — N2581 Secondary hyperparathyroidism of renal origin: Secondary | ICD-10-CM | POA: Diagnosis not present

## 2023-01-11 DIAGNOSIS — I120 Hypertensive chronic kidney disease with stage 5 chronic kidney disease or end stage renal disease: Secondary | ICD-10-CM | POA: Diagnosis not present

## 2023-01-11 DIAGNOSIS — J45909 Unspecified asthma, uncomplicated: Secondary | ICD-10-CM | POA: Diagnosis not present

## 2023-01-11 DIAGNOSIS — E1122 Type 2 diabetes mellitus with diabetic chronic kidney disease: Secondary | ICD-10-CM | POA: Diagnosis not present

## 2023-01-11 DIAGNOSIS — E1152 Type 2 diabetes mellitus with diabetic peripheral angiopathy with gangrene: Secondary | ICD-10-CM | POA: Diagnosis not present

## 2023-01-11 DIAGNOSIS — E1165 Type 2 diabetes mellitus with hyperglycemia: Secondary | ICD-10-CM | POA: Diagnosis not present

## 2023-01-11 DIAGNOSIS — Z992 Dependence on renal dialysis: Secondary | ICD-10-CM | POA: Diagnosis not present

## 2023-01-12 NOTE — Telephone Encounter (Signed)
I believe April spoke with them to inform them of 3 x per week

## 2023-01-13 DIAGNOSIS — N186 End stage renal disease: Secondary | ICD-10-CM | POA: Diagnosis not present

## 2023-01-13 DIAGNOSIS — Z992 Dependence on renal dialysis: Secondary | ICD-10-CM | POA: Diagnosis not present

## 2023-01-13 DIAGNOSIS — N2581 Secondary hyperparathyroidism of renal origin: Secondary | ICD-10-CM | POA: Diagnosis not present

## 2023-01-14 ENCOUNTER — Ambulatory Visit (INDEPENDENT_AMBULATORY_CARE_PROVIDER_SITE_OTHER): Payer: Medicare HMO | Admitting: Nurse Practitioner

## 2023-01-14 ENCOUNTER — Encounter (INDEPENDENT_AMBULATORY_CARE_PROVIDER_SITE_OTHER): Payer: Self-pay | Admitting: Nurse Practitioner

## 2023-01-14 VITALS — BP 156/71 | HR 64 | Resp 18 | Ht 76.0 in | Wt 395.0 lb

## 2023-01-14 DIAGNOSIS — L97329 Non-pressure chronic ulcer of left ankle with unspecified severity: Secondary | ICD-10-CM

## 2023-01-14 DIAGNOSIS — I890 Lymphedema, not elsewhere classified: Secondary | ICD-10-CM

## 2023-01-14 DIAGNOSIS — I83023 Varicose veins of left lower extremity with ulcer of ankle: Secondary | ICD-10-CM

## 2023-01-14 DIAGNOSIS — I10 Essential (primary) hypertension: Secondary | ICD-10-CM

## 2023-01-14 NOTE — Progress Notes (Signed)
Subjective:    Patient ID: [name], male    DOB: [birth year], 52 y.o.   MRN: [id number] No chief complaint on file.   Because "is a 52 year old male who presents today for evaluation of his left lower extremity venous ulceration.  The patient underwent debridement and placement of a wound vacuum last week.  Today the wound bed has no evidence of infection and shows very good granulation.  The wound bed also appears to be much shallower than it was previously.  He denies any fever or chills.  He is tolerating the wound vacuum well.    Review of Systems  Cardiovascular:  Positive for leg swelling.  Skin:  Positive for wound.  All other systems reviewed and are negative.      Objective:   Physical Exam Vitals reviewed.  HENT:     Head: Normocephalic.  Cardiovascular:     Rate and Rhythm: Normal rate.  Pulmonary:     Effort: Pulmonary effort is normal.  Musculoskeletal:     Left lower leg: 4+ Edema present.  Skin:    General: Skin is warm and dry.  Neurological:     Mental Status: He is alert and oriented to person, place, and time.  Psychiatric:        Mood and Affect: Mood normal.        Behavior: Behavior normal.        Thought Content: Thought content normal.        Judgment: Judgment normal.          BP (!) 156/71 (BP Location: Right Arm)   Pulse 64   Resp 18   Ht 6\' 4"  (1.93 m)   Wt (!) 395 lb (179.2 kg)   BMI 48.08 kg/m   Past Medical History:  Diagnosis Date   Anemia    Chronic kidney disease    14% FUNCTION   Diabetes mellitus without complication (HCC)    Diabetic retinopathy (HCC)    Dyspnea    DOE   Dysrhythmia    End stage renal disease (HCC)    History of orthopnea    error wrong patietn   Hypercholesteremia    error   Hyperkalemia    error wrong patient   Hypertension    Long-term insulin use (HCC)    Lymphedema of leg    Metabolic encephalopathy    error   Microalbuminuria    MSSA (methicillin susceptible Staphylococcus  aureus)    Obesity    PVD (peripheral vascular disease) (HCC)    Respiratory failure (HCC)    Error   Sepsis (HCC)    error   Sleep apnea    no CPAP   UTI (urinary tract infection)    error   Venous ulcer (HCC)    Vitamin D deficiency     Social History   Socioeconomic History   Marital status: Divorced    Spouse name: Not on file   Number of children: Not on file   Years of education: Not on file   Highest education level: Not on file  Occupational History   Not on file  Tobacco Use   Smoking status: Former    Types: Cigars    Passive exposure: Past   Smokeless tobacco: Former    Quit date: 09/30/2017   Tobacco comments:    occasional  Vaping Use   Vaping Use: Never used  Substance and Sexual Activity   Alcohol use: Yes    Alcohol/week: 1.0 standard  drink of alcohol    Types: 1 Cans of beer per week    Comment: beer   Drug use: Not Currently   Sexual activity: Yes  Other Topics Concern   Not on file  Social History Narrative   Lives alone.  Significant other, Debbie.    Social Determinants of Health   Financial Resource Strain: Not on file  Food Insecurity: No Food Insecurity (12/03/2022)   Hunger Vital Sign    Worried About Running Out of Food in the Last Year: Never true    Ran Out of Food in the Last Year: Never true  Transportation Needs: No Transportation Needs (12/03/2022)   PRAPARE - Administrator, Civil Service (Medical): No    Lack of Transportation (Non-Medical): No  Physical Activity: Not on file  Stress: Not on file  Social Connections: Not on file  Intimate Partner Violence: Not At Risk (11/10/2022)   Humiliation, Afraid, Rape, and Kick questionnaire    Fear of Current or Ex-Partner: No    Emotionally Abused: No    Physically Abused: No    Sexually Abused: No    Past Surgical History:  Procedure Laterality Date   A/V FISTULAGRAM Right 10/24/2018   Procedure: A/V FISTULAGRAM;  Surgeon: Renford Dills, MD;  Location: [facility]  INVASIVE CV LAB;  Service: Cardiovascular;  Laterality: Right;   A/V FISTULAGRAM Right 11/24/2018   Procedure: A/V FISTULAGRAM;  Surgeon: Renford Dills, MD;  Location: [facility] INVASIVE CV [facility];  Service: Cardiovascular;  Laterality: Right;   A/V FISTULAGRAM Left 07/31/2019   Procedure: A/V FISTULAGRAM;  Surgeon: Renford Dills, MD;  Location: [facility] INVAS[facility] CV [facility];  Service: Cardiovascular;  Laterality: Left;   A/V FISTULAGRAM Left 12/11/2019   Procedure: A/V FISTULAGRAM;  Surgeon: Renford Dills, MD;  Location: [facility] IN[facility] CV [facility];  Service: Cardiovascular;  Laterality: Left;   A/V SHUNT INTERVENTION Right 02/21/2019   Procedure: A/V SHUNT INTERVENTION;  Surgeon: Renford Dills, MD;  Location: [facility] INV[facility] CV [facility];  Service: Cardiovascular;  Laterality: Right;   APPLICATION OF WOUND VAC Left 10/03/2017   Procedure: APPLICATION OF WOUND VAC;  Surgeon: Annice Needy, MD;  Location: [facility] ORS;  Service: General;  Laterality: Left;   APPLICATION OF WOUND VAC Left 10/11/2017   Procedure: APPLICATION OF WOUND VAC;  Surgeon: Renford Dills, MD;  Location: [facility] ORS;  Service: Vascular;  Laterality: Left;   APPLICATION OF WOUND VAC Left 10/14/2017   Procedure: WOUND VAC CHANGE;  Surgeon: Renford Dills, MD;  Location: [facility] ORS;  Service: Vascular;  Laterality: Left;  left lower leg   APPLICATION OF WOUND VAC Left 10/18/2017   Procedure: WOUND VAC CHANGE;  Surgeon: Renford Dills, MD;  Location: [facility] ORS;  Service: Vascular;  Laterality: Left;   APPLICATION OF WOUND VAC Left 10/07/2017   Procedure: APPLICATION OF WOUND VAC;  Surgeon: Renford Dills, MD;  Location: [facility] ORS;  Service: Vascular;  Laterality: Left;   APPLICATION OF WOUND VAC Left 11/17/2022   Procedure: Wound vac exchange;  Surgeon: Renford Dills, MD;  Location: [facility] OR[facility];  Service: Vascular;  Laterality: Left;   APPLICATION OF WOUND VAC Left 11/10/2022   Procedure: APPLICATION OF WOUND VAC;  Surgeon: Renford Dills,  MD;  Location: [facility] OR[facility];  Service: Vascular;  Laterality: Left;   APPLICATION OF WOUND VAC Left 11/24/2022   Procedure: WOUND VAC REMOVAL;  Surgeon: Renford Dills, MD;  Location: [facility];  Service: Vascular;  Laterality: Left;  APPLICATION OF WOUND VAC Left 11/12/2022   Procedure: APPLICATION OF WOUND VAC;  Surgeon: Renford Dills, MD;  Location: [facility];  Service: Vascular;  Laterality: Left;   APPLICATION OF WOUND VAC Left 01/06/2023   Procedure: APPLICATION OF WOUND VAC;  Surgeon: Renford Dills, MD;  Location: [facility] OR[facility];  Service: Vascular;  Laterality: Left;   AV FISTULA INSERTION W/ RF MAGNETIC GUIDANCE Right 07/19/2018   Procedure: AV FISTULA INSERTION W/RF MAGNETIC GUIDANCE;  Surgeon: Renford Dills, MD;  Location: [facility];  Service: Cardiovascular;  Laterality: Right;   AV FISTULA PLACEMENT Left 05/11/2019   Procedure: ARTERIOVENOUS (AV) FISTULA CREATION (RADIOCEPHALIC );  Surgeon: Renford Dills, MD;  Location: [facility] OR[facility];  Service: Vascular;  Laterality: Left;   AV FISTULA PLACEMENT Left 02/20/2020   Procedure: INSERTION OF ARTERIOVENOUS (AV) GORE-TEX GRAFT LEFT ARM ( FOREARM LOOP );  Surgeon: Renford Dills, MD;  Location: [facility] OR[facility];  Service: Vascular;  Laterality: Left;   CATARACT EXTRACTION W/PHACO Left 11/01/2018   Procedure: CATARACT EXTRACTION PHACO AND INTRAOCULAR LENS PLACEMENT (IOC)-LEFT, DIABETIC-INSULIN DEPENDENT;  Surgeon: Nevada Crane, MD;  Location: [facility] OR[facility];  Service: Ophthalmology;  Laterality: Left;  [place] 00:41.8 CDE 4.61 Fluid Pack Lot # [id number]   CATARACT EXTRACTION W/PHACO Right 04/27/2019   Procedure: CATARACT EXTRACTION PHACO AND INTRAOCULAR LENS PLACEMENT (IOC);  Surgeon: Nevada Crane, MD;  Location: [facility];  Service: Ophthalmology;  Laterality: Right;  [place] 00:34 CDE 1.97 FLUID PACK LOT # [id number]    COLONOSCOPY WITH PROPOFOL N/A 05/12/2020   Procedure: COLONOSCOPY WITH PROPOFOL;  Surgeon: Toledo, Boykin Nearing, MD;   Location: [facility] ENDOS[facility];  Service: Gastroenterology;  Laterality: N/A;   DIALYSIS/PERMA CATHETER INSERTION N/A 02/23/2019   Procedure: DIALYSIS/PERMA CATHETER INSERTION;  Surgeon: Annice Needy, MD;  Location: [facility];  Service: Cardiovascular;  Laterality: N/A;   DIALYSIS/PERMA CATHETER REMOVAL N/A 04/03/2020   Procedure: DIALYSIS/PERMA CATHETER REMOVAL;  Surgeon: Renford Dills, MD;  Location: [facility];  Service: Cardiovascular;  Laterality: N/A;   DIALYSIS/PERMA CATHETER REMOVAL N/A 02/03/2022   Procedure: DIALYSIS/PERMA CATHETER REMOVAL;  Surgeon: Renford Dills, MD;  Location: [facility];  Service: Cardiovascular;  Laterality: N/A;   I & D EXTREMITY Left 10/11/2017   Procedure: IRRIGATION AND DEBRIDEMENT EXTREMITY;  Surgeon: Renford Dills, MD;  Location: [facility];  Service: Vascular;  Laterality: Left;   INCISION AND DRAINAGE ABSCESS Right 10/07/2017   Procedure: INCISION AND DRAINAGE ABSCESS;  Surgeon: Renford Dills, MD;  Location: [facility];  Service: Vascular;  Laterality: Right;   INCISION AND DRAINAGE OF WOUND Left 11/17/2022   Procedure: DEBRIDEMENT WOUND;  Surgeon: Renford Dills, MD;  Location: [facility];  Service: Vascular;  Laterality: Left;   INCISION AND DRAINAGE OF WOUND Left 11/24/2022   Procedure: DEBRIDEMENT WOUND LEFT LOWER EXTREMITY;  Surgeon: Renford Dills, MD;  Location: [facility] OR[facility];  Service: Vascular;  Laterality: Left;   IRRIGATION AND DEBRIDEMENT ABSCESS Left 10/03/2017   Procedure: IRRIGATION AND DEBRIDEMENT ABSCESS with debridement of skin, soft tissue, muscle 50sq cm;  Surgeon: Annice Needy, MD;  Location: [facility] OR[facility];  Service: General;  Laterality: Left;   LOWER EXTREMITY ANGIOGRAPHY Left 10/15/2022   Procedure: Lower Extremity Angiography;  Surgeon: Renford Dills, MD;  Location: [facility];  Service: Cardiovascular;  Laterality: Left;   TEMPORARY DIALYSIS CATHETER  02/20/2019   Procedure: TEMPORARY DIALYSIS  CATHETER;  Surgeon: Renford Dills, MD;  Location:  [facility];  Service: Cardiovascular;;   UPPER EXTREMITY ANGIOGRAPHY Left 09/18/2019   Procedure: UPPER EXTREMITY ANGIOGRAPHY;  Surgeon: Renford Dills, MD;  Location: [facility];  Service: Cardiovascular;  Laterality: Left;   WOUND DEBRIDEMENT Left 11/10/2022   Procedure: DEBRIDEMENT WOUND;  Surgeon: Renford Dills, MD;  Location: [facility];  Service: Vascular;  Laterality: Left;   WOUND DEBRIDEMENT Left 11/12/2022   Procedure: DEBRIDEMENT WOUND;  Surgeon: Renford Dills, MD;  Location: [facility];  Service: Vascular;  Laterality: Left;   WOUND DEBRIDEMENT Left 01/06/2023   Procedure: DEBRIDEMENT WOUND;  Surgeon: Renford Dills, MD;  Location: [facility];  Service: Vascular;  Laterality: Left;    Family History  Problem Relation Age of Onset   Diabetes Father    Kidney disease Father    Diabetes Daughter     Allergies  Allergen Reactions   Lisinopril Swelling    Lips mouth    Furosemide Cough    Light headed, blurry vision, weakness.   Latex Itching   Adhesive [Tape] Itching and Rash       Latest Ref Rng & Units 01/06/2023   10:53 AM 11/25/2022    8:44 AM 11/23/2022    8:38 AM  CBC  WBC 4.0 - 10.5 K/uL 17.0  4.7  5.7   Hemoglobin 13.0 - 17.0 g/dL 16.1  9.5  9.2   Hematocrit 39.0 - 52.0 % 35.9  30.6  29.0   Platelets 150 - 400 K/uL 235  156  161       CMP     Component Value Date/Time   NA 137 01/06/2023 1053   K 3.5 01/06/2023 1053   CL 96 (L) 01/06/2023 1053   CO2 28 01/06/2023 1053   GLUCOSE 100 (H) 01/06/2023 1053   BUN 37 (H) 01/06/2023 1053   CREATININE 9.07 (H) 01/06/2023 1053   CALCIUM 9.9 01/06/2023 1053   PROT 7.1 11/24/2022 0939   ALBUMIN 3.2 (L) 11/25/2022 0844   AST 16 11/24/2022 0939   ALT 7 11/24/2022 0939   ALKPHOS 46 11/24/2022 0939   BILITOT 0.5 11/24/2022 0939   GFRNONAA 6 (L) 01/06/2023 1053   GFRAA 9 (L) 02/19/2020 1316     No results found.      Assessment & Plan:   1. Venous ulcer of ankle, left (HCC) The patient has very good granulation of the wound bed and is also becoming much more shallow since beginning with the wound vacuum.  We will continue with wound vacuum usage.  We will have the patient do wound VAC changes twice weekly at home and return him to the office once a week to change his VAC as well as to apply decelerate to the wound bed.  Will see the patient back in office next week  2. Lymphedema Patient is stressed that he needs to continue with elevation of his lower extremity to help with wound healing.  Continued dependency of the leg will worsen swelling which will compromise the integrity of the wound and ability for wound healing.  Patient understands and acknowledges instructions.  3. Primary hypertension Continue antihypertensive medications as already ordered, these medications have been reviewed and there are no changes at this time.   Current Outpatient Medications on File Prior to Visit  Medication Sig Dispense Refill   acetaminophen (TYLENOL) 325 MG tablet Take 2 tablets (650 mg total) by mouth every 6 (six) hours as needed for mild pain (or Fever >/= 101).  albuterol (VENTOLIN HFA) 108 (90 Base) MCG/ACT inhaler Inhale 2 puffs into the lungs every 6 (six) hours as needed. (Patient not taking: Reported on 01/06/2023)     allopurinol (ZYLOPRIM) 100 MG tablet Take 100 mg by mouth daily.     amLODipine (NORVASC) 10 MG tablet Take 5 mg by mouth daily as needed (if bp is 190/90 or higher).      apixaban (ELIQUIS) 5 MG TABS tablet Take 1 tablet (5 mg total) by mouth 2 (two) times daily. 60 tablet 3   atenolol (TENORMIN) 50 MG tablet Take 25 mg by mouth daily as needed (if bp is 190/90 or higher).      atorvastatin (LIPITOR) 10 MG tablet Take 1 tablet (10 mg total) by mouth daily. Further refills per primary care 30 tablet 2   B Complex-C-Folic Acid (RENA-VITE RX) 1 MG TABS Take 1 tablet by mouth 3 (three) times a  week.      calcium acetate (PHOSLO) 667 MG capsule Take 1,334-2,001 mg by mouth See admin instructions. Take 2001 mg by mouth with each meal & take 1334 mg by mouth with each snack.     cinacalcet (SENSIPAR) 30 MG tablet Take 30 mg by mouth daily.     CINNAMON PO Take 500 mg by mouth 2 (two) times daily.      ciprofloxacin (CIPRO) 500 MG tablet Take 1 tablet (500 mg total) by mouth daily with breakfast. 10 tablet 0   clopidogrel (PLAVIX) 75 MG tablet TAKE 1 TABLET BY MOUTH EVERY DAY 90 tablet 1   Insulin Glargine (LANTUS SOLOSTAR) 100 UNIT/ML Solostar Pen Inject 15 Units into the skin at bedtime.      lidocaine-prilocaine (EMLA) cream Apply 1 application topically as needed (port access).     Multiple Vitamin (MULTIVITAMIN WITH MINERALS) TABS tablet Take 1 tablet by mouth 3 (three) times a week. Men's Multivitamin     Multiple Vitamins-Minerals (EYE HEALTH PO) Take 1 capsule by mouth daily.      oxyCODONE (OXY IR/ROXICODONE) 5 MG immediate release tablet Take 1 tablet (5 mg total) by mouth every 6 (six) hours as needed for moderate pain or severe pain. 30 tablet 0   Polyethyl Glycol-Propyl Glycol (SYSTANE OP) Place 1 drop into both eyes daily as needed (dry eyes).     sildenafil (VIAGRA) 100 MG tablet Take 1 tablet (100 mg total) by mouth daily as needed for erectile dysfunction. 30 tablet 3   traZODone (DESYREL) 50 MG tablet Take 50 mg by mouth at bedtime.     No current facility-administered medications on file prior to visit.    There are no Patient Instructions on file for this visit. No follow-ups on file.   Georgiana Spinner, NP

## 2023-01-15 DIAGNOSIS — Z992 Dependence on renal dialysis: Secondary | ICD-10-CM | POA: Diagnosis not present

## 2023-01-15 DIAGNOSIS — N2581 Secondary hyperparathyroidism of renal origin: Secondary | ICD-10-CM | POA: Diagnosis not present

## 2023-01-15 DIAGNOSIS — N186 End stage renal disease: Secondary | ICD-10-CM | POA: Diagnosis not present

## 2023-01-15 NOTE — Progress Notes (Signed)
01/17/23 10:37 AM   [name] [birth year] [id number]  Referring provider:  Marina Goodell, MD [address] [place],  [place] [address]   Urological history:  1. Increased prolactin  - pituitary MRI (11/2022) - No convincing evidence of pituitary adenoma on the current study - followed by endocrinology, last seen 11/2022    2. Testosterone deficiency  - testosterone level 294, 07/2021  - followed by endocrinology     3. ED  - contributing factors of age, testosterone deficiency, DM, HTN, sleep apnea (untreated), history of smoking and ESRD  - did not tolerate the ICI - sildenafil 50 mg, on-demand-dosing    4. BPH  -PSA 0.58, 07/2021      Chief Complaint  Patient presents with   Erectile Dysfunction     HPI: [name] is a 52 y.o.male with a past medical history of uncontrolled diabetes, morbid obesity, hyperprolactinemia and end-stage renal disease (dialysis schedule is Tuesday, Thursday and Saturdays) who presents today for yearly follow up.    Recent prostate MRI did not demonstrate a pituitary tumor.    SHIM 16   He feels that sildenafil 100 mg on-demand dosing is effective, but right now he is dealing with leg wound and he is not sexually active.  The leg wound is going to be a long recovery.  Patient still having spontaneous erections.  He denies any pain or curvature with erections.     SHIM     Row Name 01/17/23 1013         SHIM: Over the last 6 months:   How do you rate your confidence that you could get and keep an erection? Low     When you had erections with sexual stimulation, how often were your erections hard enough for penetration (entering your partner)? Sometimes (about half the time)     During sexual intercourse, how often were you able to maintain your erection after you had penetrated (entered) your partner? Most Times (much more than half the time)     During sexual intercourse, how difficult was it to maintain your erection to  completion of intercourse? Difficult     When you attempted sexual intercourse, how often was it satisfactory for you? Most Times (much more than half the time)       SHIM Total Score   SHIM 16               Score: 1-7 Severe ED 8-11 Moderate ED 12-16 Mild-Moderate ED 17-21 Mild ED 22-25 No ED    PMH: Past Medical History:  Diagnosis Date   Anemia    Chronic kidney disease    14% FUNCTION   Diabetes mellitus without complication (HCC)    Diabetic retinopathy (HCC)    Dyspnea    DOE   Dysrhythmia    End stage renal disease (HCC)    History of orthopnea    error wrong patietn   Hypercholesteremia    error   Hyperkalemia    error wrong patient   Hypertension    Long-term insulin use (HCC)    Lymphedema of leg    Metabolic encephalopathy    error   Microalbuminuria    MSSA (methicillin susceptible Staphylococcus aureus)    Obesity    PVD (peripheral vascular disease) (HCC)    Respiratory failure (HCC)    Error   Sepsis (HCC)    error   Sleep apnea    no CPAP   UTI (urinary  tract infection)    error   Venous ulcer (HCC)    Vitamin D deficiency     Surgical History: Past Surgical History:  Procedure Laterality Date   A/V FISTULAGRAM Right 10/24/2018   Procedure: A/V FISTULAGRAM;  Surgeon: Renford Dills, MD;  Location: [facility];  Service: Cardiovascular;  Laterality: Right;   A/V FISTULAGRAM Right 11/24/2018   Procedure: A/V FISTULAGRAM;  Surgeon: Renford Dills, MD;  Location: [facility];  Service: Cardiovascular;  Laterality: Right;   A/V FISTULAGRAM Left 07/31/2019   Procedure: A/V FISTULAGRAM;  Surgeon: Renford Dills, MD;  Location: [facility];  Service: Cardiovascular;  Laterality: Left;   A/V FISTULAGRAM Left 12/11/2019   Procedure: A/V FISTULAGRAM;  Surgeon: Renford Dills, MD;  Location: [facility];  Service: Cardiovascular;  Laterality: Left;   A/V SHUNT INTERVENTION Right 02/21/2019    Procedure: A/V SHUNT INTERVENTION;  Surgeon: Renford Dills, MD;  Location: [facility];  Service: Cardiovascular;  Laterality: Right;   APPLICATION OF WOUND VAC Left 10/03/2017   Procedure: APPLICATION OF WOUND VAC;  Surgeon: Annice Needy, MD;  Location: [facility];  Service: General;  Laterality: Left;   APPLICATION OF WOUND VAC Left 10/11/2017   Procedure: APPLICATION OF WOUND VAC;  Surgeon: Renford Dills, MD;  Location: [facility];  Service: Vascular;  Laterality: Left;   APPLICATION OF WOUND VAC Left 10/14/2017   Procedure: WOUND VAC CHANGE;  Surgeon: Renford Dills, MD;  Location: [facility];  Service: Vascular;  Laterality: Left;  left lower leg   APPLICATION OF WOUND VAC Left 10/18/2017   Procedure: WOUND VAC CHANGE;  Surgeon: Renford Dills, MD;  Location: [facility];  Service: Vascular;  Laterality: Left;   APPLICATION OF WOUND VAC Left 10/07/2017   Procedure: APPLICATION OF WOUND VAC;  Surgeon: Renford Dills, MD;  Location: [facility];  Service: Vascular;  Laterality: Left;   APPLICATION OF WOUND VAC Left 11/17/2022   Procedure: Wound vac exchange;  Surgeon: Renford Dills, MD;  Location: [facility];  Service: Vascular;  Laterality: Left;   APPLICATION OF WOUND VAC Left 11/10/2022   Procedure: APPLICATION OF WOUND VAC;  Surgeon: Renford Dills, MD;  Location: [facility];  Service: Vascular;  Laterality: Left;   APPLICATION OF WOUND VAC Left 11/24/2022   Procedure: WOUND VAC REMOVAL;  Surgeon: Renford Dills, MD;  Location: [facility];  Service: Vascular;  Laterality: Left;   APPLICATION OF WOUND VAC Left 11/12/2022   Procedure: APPLICATION OF WOUND VAC;  Surgeon: Renford Dills, MD;  Location: [facility];  Service: Vascular;  Laterality: Left;   APPLICATION OF WOUND VAC Left 01/06/2023   Procedure: APPLICATION OF WOUND VAC;  Surgeon: Renford Dills, MD;  Location: [facility];  Service: Vascular;  Laterality: Left;   AV FISTULA INSERTION W/ RF MAGNETIC GUIDANCE Right  07/19/2018   Procedure: AV FISTULA INSERTION W/RF MAGNETIC GUIDANCE;  Surgeon: Renford Dills, MD;  Location: [facility];  Service: Cardiovascular;  Laterality: Right;   AV FISTULA PLACEMENT Left 05/11/2019   Procedure: ARTERIOVENOUS (AV) FISTULA CREATION (RADIOCEPHALIC );  Surgeon: Renford Dills, MD;  Location: [facility];  Service: Vascular;  Laterality: Left;   AV FISTULA PLACEMENT Left 02/20/2020   Procedure: INSERTION OF ARTERIOVENOUS (AV) GORE-TEX GRAFT LEFT ARM ( FOREARM LOOP );  Surgeon: Renford Dills, MD;  Location: [facility];  Service: Vascular;  Laterality: Left;   CATARACT  EXTRACTION W/PHACO Left 11/01/2018   Procedure: CATARACT EXTRACTION PHACO AND INTRAOCULAR LENS PLACEMENT (IOC)-LEFT, DIABETIC-INSULIN DEPENDENT;  Surgeon: Nevada Crane, MD;  Location: [facility];  Service: Ophthalmology;  Laterality: Left;  [place] 00:41.8 CDE 4.61 Fluid Pack Lot # [id number]   CATARACT EXTRACTION W/PHACO Right 04/27/2019   Procedure: CATARACT EXTRACTION PHACO AND INTRAOCULAR LENS PLACEMENT (IOC);  Surgeon: Nevada Crane, MD;  Location: [facility];  Service: Ophthalmology;  Laterality: Right;  [place] 00:34 CDE 1.97 FLUID PACK LOT # [id number]    COLONOSCOPY WITH PROPOFOL N/A 05/12/2020   Procedure: COLONOSCOPY WITH PROPOFOL;  Surgeon: [place], Boykin Nearing, MD;  Location: [facility];  Service: Gastroenterology;  Laterality: N/A;   DIALYSIS/PERMA CATHETER INSERTION N/A 02/23/2019   Procedure: DIALYSIS/PERMA CATHETER INSERTION;  Surgeon: Annice Needy, MD;  Location: [facility];  Service: Cardiovascular;  Laterality: N/A;   DIALYSIS/PERMA CATHETER REMOVAL N/A 04/03/2020   Procedure: DIALYSIS/PERMA CATHETER REMOVAL;  Surgeon: Renford Dills, MD;  Location: [facility];  Service: Cardiovascular;  Laterality: N/A;   DIALYSIS/PERMA CATHETER REMOVAL N/A 02/03/2022   Procedure: DIALYSIS/PERMA CATHETER REMOVAL;  Surgeon: Renford Dills, MD;  Location: [facility];   Service: Cardiovascular;  Laterality: N/A;   I & D EXTREMITY Left 10/11/2017   Procedure: IRRIGATION AND DEBRIDEMENT EXTREMITY;  Surgeon: Renford Dills, MD;  Location: [facility];  Service: Vascular;  Laterality: Left;   INCISION AND DRAINAGE ABSCESS Right 10/07/2017   Procedure: INCISION AND DRAINAGE ABSCESS;  Surgeon: Renford Dills, MD;  Location: [facility];  Service: Vascular;  Laterality: Right;   INCISION AND DRAINAGE OF WOUND Left 11/17/2022   Procedure: DEBRIDEMENT WOUND;  Surgeon: Renford Dills, MD;  Location: [facility];  Service: Vascular;  Laterality: Left;   INCISION AND DRAINAGE OF WOUND Left 11/24/2022   Procedure: DEBRIDEMENT WOUND LEFT LOWER EXTREMITY;  Surgeon: Renford Dills, MD;  Location: [facility];  Service: Vascular;  Laterality: Left;   IRRIGATION AND DEBRIDEMENT ABSCESS Left 10/03/2017   Procedure: IRRIGATION AND DEBRIDEMENT ABSCESS with debridement of skin, soft tissue, muscle 50sq cm;  Surgeon: Annice Needy, MD;  Location: [facility];  Service: General;  Laterality: Left;   LOWER EXTREMITY ANGIOGRAPHY Left 10/15/2022   Procedure: Lower Extremity Angiography;  Surgeon: Renford Dills, MD;  Location: [facility];  Service: Cardiovascular;  Laterality: Left;   TEMPORARY DIALYSIS CATHETER  02/20/2019   Procedure: TEMPORARY DIALYSIS CATHETER;  Surgeon: Renford Dills, MD;  Location: [facility];  Service: Cardiovascular;;   UPPER EXTREMITY ANGIOGRAPHY Left 09/18/2019   Procedure: UPPER EXTREMITY ANGIOGRAPHY;  Surgeon: Renford Dills, MD;  Location: [facility];  Service: Cardiovascular;  Laterality: Left;   WOUND DEBRIDEMENT Left 11/10/2022   Procedure: DEBRIDEMENT WOUND;  Surgeon: Renford Dills, MD;  Location: [facility];  Service: Vascular;  Laterality: Left;   WOUND DEBRIDEMENT Left 11/12/2022   Procedure: DEBRIDEMENT WOUND;  Surgeon: Renford Dills, MD;  Location: [facility];  Service: Vascular;  Laterality: Left;   WOUND  DEBRIDEMENT Left 01/06/2023   Procedure: DEBRIDEMENT WOUND;  Surgeon: Renford Dills, MD;  Location: [facility];  Service: Vascular;  Laterality: Left;    Home Medications:  Allergies as of 01/17/2023       Reactions   Lisinopril Swelling   Lips mouth    Furosemide Cough   Light headed, blurry vision, weakness.   Latex Itching   Adhesive [tape] Itching, Rash  Medication List        Accurate as of Jan 17, 2023 10:37 AM. If you have any questions, ask your nurse or doctor.          STOP taking these medications    albuterol 108 (90 Base) MCG/ACT inhaler Commonly known as: VENTOLIN HFA Stopped by: Leeum Sankey, PA-C   sildenafil 100 MG tablet Commonly known as: VIAGRA Stopped by: Shauntel Prest, PA-C       TAKE these medications    Accu-Chek Guide test strip Generic drug: glucose blood USE 1 STRIP TOTAL 2 TIMES DAILY FOR 90 DAYS   acetaminophen 325 MG tablet Commonly known as: TYLENOL Take 2 tablets (650 mg total) by mouth every 6 (six) hours as needed for mild pain (or Fever >/= 101).   allopurinol 100 MG tablet Commonly known as: ZYLOPRIM Take 100 mg by mouth daily.   amLODipine 10 MG tablet Commonly known as: NORVASC Take 5 mg by mouth daily as needed (if bp is 190/90 or higher).   apixaban 5 MG Tabs tablet Commonly known as: ELIQUIS Take 1 tablet (5 mg total) by mouth 2 (two) times daily.   atenolol 50 MG tablet Commonly known as: TENORMIN Take 25 mg by mouth daily as needed (if bp is 190/90 or higher).   atorvastatin 10 MG tablet Commonly known as: Lipitor Take 1 tablet (10 mg total) by mouth daily. Further refills per primary care   calcium acetate 667 MG capsule Commonly known as: PHOSLO Take 1,334-2,001 mg by mouth See admin instructions. Take 2001 mg by mouth with each meal & take 1334 mg by mouth with each snack.   cinacalcet 30 MG tablet Commonly known as: SENSIPAR Take 30 mg by mouth daily.   CINNAMON PO Take 500 mg by  mouth 2 (two) times daily.   ciprofloxacin 500 MG tablet Commonly known as: CIPRO Take 1 tablet (500 mg total) by mouth daily with breakfast.   clopidogrel 75 MG tablet Commonly known as: PLAVIX TAKE 1 TABLET BY MOUTH EVERY DAY   Dexcom G7 Sensor Misc USE 1 EACH EVERY 10 (TEN) DAYS   EYE HEALTH PO Take 1 capsule by mouth daily.   Lantus SoloStar 100 UNIT/ML Solostar Pen Generic drug: insulin glargine Inject 15 Units into the skin at bedtime.   lidocaine-prilocaine cream Commonly known as: EMLA Apply 1 application topically as needed (port access).   multivitamin with minerals Tabs tablet Take 1 tablet by mouth 3 (three) times a week. Men's Multivitamin   oxyCODONE 5 MG immediate release tablet Commonly known as: Oxy IR/ROXICODONE Take 1 tablet (5 mg total) by mouth every 6 (six) hours as needed for moderate pain or severe pain.   Rena-Vite Rx 1 MG Tabs Take 1 tablet by mouth 3 (three) times a week.   sildenafil 20 MG tablet Commonly known as: REVATIO Take 1 tablet daily.  Do not take with nitrates. Started by: Michiel Cowboy, PA-C   SYSTANE OP Place 1 drop into both eyes daily as needed (dry eyes).   traZODone 50 MG tablet Commonly known as: DESYREL Take 50 mg by mouth at bedtime.        Allergies:  Allergies  Allergen Reactions   Lisinopril Swelling    Lips mouth    Furosemide Cough    Light headed, blurry vision, weakness.   Latex Itching   Adhesive [Tape] Itching and Rash    Family History: Family History  Problem Relation Age of Onset   Diabetes Father  Kidney disease Father    Diabetes Daughter     Social History:  reports that he has quit smoking. His smoking use included cigars. He has been exposed to tobacco smoke. He quit smokeless tobacco use about 5 years ago. He reports current alcohol use of about 1.0 standard drink of alcohol per week. He reports that he does not currently use drugs.   Physical Exam: BP (!) 159/69 (BP Location:  Left Arm, Patient Position: Sitting, Cuff Size: Large)   Pulse 83   Ht 6\' 4"  (1.93 m)   Wt (!) 395 lb (179.2 kg)   BMI 48.08 kg/m   Constitutional:  Well nourished. Alert and oriented, No acute distress. HEENT: [place] AT, moist mucus membranes.  Trachea midline Cardiovascular: No clubbing, cyanosis, or edema. Respiratory: Normal respiratory effort, no increased work of breathing. Neurologic: Grossly intact, no focal deficits, moving all 4 extremities. Psychiatric: Normal mood and affect.   Laboratory Data: Serum creatinine (12/2022) - 9.07, eGFR 6 Total cholesterol (11/2022) 110 I have reviewed the labs.   Pertinent Imaging: Narrative & Impression  CLINICAL DATA:  Possible pituitary adenoma   EXAM: MRI HEAD WITHOUT AND WITH CONTRAST   TECHNIQUE: Multiplanar, multiecho pulse sequences of the brain and surrounding structures were obtained without and with intravenous contrast.   CONTRAST:  7.38mL GADAVIST GADOBUTROL 1 MMOL/ML IV SOLN   COMPARISON:  MRI brain/sella 05/09/2020   FINDINGS: Brain: There is no acute intracranial hemorrhage, extra-axial fluid collection, or acute infarct   Parenchymal volume is normal. The ventricles are normal in size. Parenchymal signal is normal.   There is no mass lesion or abnormal enhancement. There is no mass effect or midline shift.   Pituitary/Sella: Pituitary gland is normal in size. The infundibulum is midline. The previously described focus of hypoenhancement in the right aspect of the sella is not definitely seen on the current study. There is no convincing evidence of pituitary adenoma on the current study.   Vascular: Normal flow voids.   Skull and upper cervical spine: Normal marrow signal.   Sinuses/Orbits: The paranasal sinuses are clear. Bilateral lens implants are in place. The globes and orbits are otherwise unremarkable.   Other: None.   IMPRESSION: No convincing evidence of pituitary adenoma on the current  study.     Electronically Signed   By: Lesia Hausen M.D.   On: 12/04/2022 11:03  I have independently reviewed the films.  See HPI.    Assessment & Plan:    ED  -Responding to the 100 mg sildenafil -Because he is not sexually active at this time and he does not know when he will be able to become sexually active, we will reduce the sildenafil to 20 mg and he will take that daily for penile redilatation until he is able to become sexually active  2. Testosterone deficiency - followed by endocrinology   3. Increased prolactin - followed by endocrinology  Return for as scheduled .  Cloretta Ned   [facility] [address] [place], [place] [address] [phone]

## 2023-01-17 ENCOUNTER — Encounter: Payer: Self-pay | Admitting: Urology

## 2023-01-17 ENCOUNTER — Ambulatory Visit (INDEPENDENT_AMBULATORY_CARE_PROVIDER_SITE_OTHER): Payer: Medicare HMO | Admitting: Urology

## 2023-01-17 VITALS — BP 159/69 | HR 83 | Ht 76.0 in | Wt 395.0 lb

## 2023-01-17 DIAGNOSIS — N529 Male erectile dysfunction, unspecified: Secondary | ICD-10-CM | POA: Diagnosis not present

## 2023-01-17 DIAGNOSIS — I872 Venous insufficiency (chronic) (peripheral): Secondary | ICD-10-CM | POA: Diagnosis not present

## 2023-01-17 DIAGNOSIS — D352 Benign neoplasm of pituitary gland: Secondary | ICD-10-CM | POA: Diagnosis not present

## 2023-01-17 DIAGNOSIS — I499 Cardiac arrhythmia, unspecified: Secondary | ICD-10-CM | POA: Diagnosis not present

## 2023-01-17 DIAGNOSIS — I890 Lymphedema, not elsewhere classified: Secondary | ICD-10-CM | POA: Diagnosis not present

## 2023-01-17 DIAGNOSIS — E349 Endocrine disorder, unspecified: Secondary | ICD-10-CM

## 2023-01-17 DIAGNOSIS — E291 Testicular hypofunction: Secondary | ICD-10-CM | POA: Diagnosis not present

## 2023-01-17 DIAGNOSIS — E1165 Type 2 diabetes mellitus with hyperglycemia: Secondary | ICD-10-CM | POA: Diagnosis not present

## 2023-01-17 DIAGNOSIS — E1122 Type 2 diabetes mellitus with diabetic chronic kidney disease: Secondary | ICD-10-CM | POA: Diagnosis not present

## 2023-01-17 DIAGNOSIS — N186 End stage renal disease: Secondary | ICD-10-CM | POA: Diagnosis not present

## 2023-01-17 DIAGNOSIS — J45909 Unspecified asthma, uncomplicated: Secondary | ICD-10-CM | POA: Diagnosis not present

## 2023-01-17 DIAGNOSIS — E1152 Type 2 diabetes mellitus with diabetic peripheral angiopathy with gangrene: Secondary | ICD-10-CM | POA: Diagnosis not present

## 2023-01-17 DIAGNOSIS — I120 Hypertensive chronic kidney disease with stage 5 chronic kidney disease or end stage renal disease: Secondary | ICD-10-CM | POA: Diagnosis not present

## 2023-01-17 MED ORDER — SILDENAFIL CITRATE 20 MG PO TABS
ORAL_TABLET | ORAL | 11 refills | Status: DC
Start: 2023-01-17 — End: 2023-12-14

## 2023-01-18 DIAGNOSIS — Z992 Dependence on renal dialysis: Secondary | ICD-10-CM | POA: Diagnosis not present

## 2023-01-18 DIAGNOSIS — N186 End stage renal disease: Secondary | ICD-10-CM | POA: Diagnosis not present

## 2023-01-18 DIAGNOSIS — N2581 Secondary hyperparathyroidism of renal origin: Secondary | ICD-10-CM | POA: Diagnosis not present

## 2023-01-19 DIAGNOSIS — E1165 Type 2 diabetes mellitus with hyperglycemia: Secondary | ICD-10-CM | POA: Diagnosis not present

## 2023-01-19 DIAGNOSIS — I890 Lymphedema, not elsewhere classified: Secondary | ICD-10-CM | POA: Diagnosis not present

## 2023-01-19 DIAGNOSIS — I872 Venous insufficiency (chronic) (peripheral): Secondary | ICD-10-CM | POA: Diagnosis not present

## 2023-01-19 DIAGNOSIS — J45909 Unspecified asthma, uncomplicated: Secondary | ICD-10-CM | POA: Diagnosis not present

## 2023-01-19 DIAGNOSIS — I499 Cardiac arrhythmia, unspecified: Secondary | ICD-10-CM | POA: Diagnosis not present

## 2023-01-19 DIAGNOSIS — I120 Hypertensive chronic kidney disease with stage 5 chronic kidney disease or end stage renal disease: Secondary | ICD-10-CM | POA: Diagnosis not present

## 2023-01-19 DIAGNOSIS — E1122 Type 2 diabetes mellitus with diabetic chronic kidney disease: Secondary | ICD-10-CM | POA: Diagnosis not present

## 2023-01-19 DIAGNOSIS — N186 End stage renal disease: Secondary | ICD-10-CM | POA: Diagnosis not present

## 2023-01-19 DIAGNOSIS — E1152 Type 2 diabetes mellitus with diabetic peripheral angiopathy with gangrene: Secondary | ICD-10-CM | POA: Diagnosis not present

## 2023-01-20 DIAGNOSIS — Z992 Dependence on renal dialysis: Secondary | ICD-10-CM | POA: Diagnosis not present

## 2023-01-20 DIAGNOSIS — N2581 Secondary hyperparathyroidism of renal origin: Secondary | ICD-10-CM | POA: Diagnosis not present

## 2023-01-20 DIAGNOSIS — N186 End stage renal disease: Secondary | ICD-10-CM | POA: Diagnosis not present

## 2023-01-21 ENCOUNTER — Ambulatory Visit (INDEPENDENT_AMBULATORY_CARE_PROVIDER_SITE_OTHER): Payer: Medicare HMO | Admitting: Nurse Practitioner

## 2023-01-21 ENCOUNTER — Encounter (INDEPENDENT_AMBULATORY_CARE_PROVIDER_SITE_OTHER): Payer: Self-pay | Admitting: Nurse Practitioner

## 2023-01-21 VITALS — BP 155/79 | HR 51

## 2023-01-21 DIAGNOSIS — I83023 Varicose veins of left lower extremity with ulcer of ankle: Secondary | ICD-10-CM

## 2023-01-21 DIAGNOSIS — L97329 Non-pressure chronic ulcer of left ankle with unspecified severity: Secondary | ICD-10-CM

## 2023-01-21 NOTE — Progress Notes (Signed)
Wound vac change with Cellerate  Cellerate, aquacel and apidex on lower foot wrapped with kerlex and ace wrap

## 2023-01-22 DIAGNOSIS — Z992 Dependence on renal dialysis: Secondary | ICD-10-CM | POA: Diagnosis not present

## 2023-01-22 DIAGNOSIS — N2581 Secondary hyperparathyroidism of renal origin: Secondary | ICD-10-CM | POA: Diagnosis not present

## 2023-01-22 DIAGNOSIS — N186 End stage renal disease: Secondary | ICD-10-CM | POA: Diagnosis not present

## 2023-01-23 ENCOUNTER — Encounter (INDEPENDENT_AMBULATORY_CARE_PROVIDER_SITE_OTHER): Payer: Self-pay | Admitting: Nurse Practitioner

## 2023-01-24 DIAGNOSIS — I890 Lymphedema, not elsewhere classified: Secondary | ICD-10-CM | POA: Diagnosis not present

## 2023-01-24 DIAGNOSIS — I499 Cardiac arrhythmia, unspecified: Secondary | ICD-10-CM | POA: Diagnosis not present

## 2023-01-24 DIAGNOSIS — E1165 Type 2 diabetes mellitus with hyperglycemia: Secondary | ICD-10-CM | POA: Diagnosis not present

## 2023-01-24 DIAGNOSIS — I872 Venous insufficiency (chronic) (peripheral): Secondary | ICD-10-CM | POA: Diagnosis not present

## 2023-01-24 DIAGNOSIS — N186 End stage renal disease: Secondary | ICD-10-CM | POA: Diagnosis not present

## 2023-01-24 DIAGNOSIS — J45909 Unspecified asthma, uncomplicated: Secondary | ICD-10-CM | POA: Diagnosis not present

## 2023-01-24 DIAGNOSIS — E1152 Type 2 diabetes mellitus with diabetic peripheral angiopathy with gangrene: Secondary | ICD-10-CM | POA: Diagnosis not present

## 2023-01-24 DIAGNOSIS — E1122 Type 2 diabetes mellitus with diabetic chronic kidney disease: Secondary | ICD-10-CM | POA: Diagnosis not present

## 2023-01-24 DIAGNOSIS — I120 Hypertensive chronic kidney disease with stage 5 chronic kidney disease or end stage renal disease: Secondary | ICD-10-CM | POA: Diagnosis not present

## 2023-01-25 DIAGNOSIS — Z79899 Other long term (current) drug therapy: Secondary | ICD-10-CM | POA: Diagnosis not present

## 2023-01-25 DIAGNOSIS — Z992 Dependence on renal dialysis: Secondary | ICD-10-CM | POA: Diagnosis not present

## 2023-01-25 DIAGNOSIS — N2581 Secondary hyperparathyroidism of renal origin: Secondary | ICD-10-CM | POA: Diagnosis not present

## 2023-01-25 DIAGNOSIS — N186 End stage renal disease: Secondary | ICD-10-CM | POA: Diagnosis not present

## 2023-01-26 DIAGNOSIS — J45909 Unspecified asthma, uncomplicated: Secondary | ICD-10-CM | POA: Diagnosis not present

## 2023-01-26 DIAGNOSIS — E1152 Type 2 diabetes mellitus with diabetic peripheral angiopathy with gangrene: Secondary | ICD-10-CM | POA: Diagnosis not present

## 2023-01-26 DIAGNOSIS — I890 Lymphedema, not elsewhere classified: Secondary | ICD-10-CM | POA: Diagnosis not present

## 2023-01-26 DIAGNOSIS — E1165 Type 2 diabetes mellitus with hyperglycemia: Secondary | ICD-10-CM | POA: Diagnosis not present

## 2023-01-26 DIAGNOSIS — E1122 Type 2 diabetes mellitus with diabetic chronic kidney disease: Secondary | ICD-10-CM | POA: Diagnosis not present

## 2023-01-26 DIAGNOSIS — I499 Cardiac arrhythmia, unspecified: Secondary | ICD-10-CM | POA: Diagnosis not present

## 2023-01-26 DIAGNOSIS — N186 End stage renal disease: Secondary | ICD-10-CM | POA: Diagnosis not present

## 2023-01-26 DIAGNOSIS — I120 Hypertensive chronic kidney disease with stage 5 chronic kidney disease or end stage renal disease: Secondary | ICD-10-CM | POA: Diagnosis not present

## 2023-01-26 DIAGNOSIS — I872 Venous insufficiency (chronic) (peripheral): Secondary | ICD-10-CM | POA: Diagnosis not present

## 2023-01-27 DIAGNOSIS — Z992 Dependence on renal dialysis: Secondary | ICD-10-CM | POA: Diagnosis not present

## 2023-01-27 DIAGNOSIS — N186 End stage renal disease: Secondary | ICD-10-CM | POA: Diagnosis not present

## 2023-01-27 DIAGNOSIS — N2581 Secondary hyperparathyroidism of renal origin: Secondary | ICD-10-CM | POA: Diagnosis not present

## 2023-01-28 ENCOUNTER — Encounter (INDEPENDENT_AMBULATORY_CARE_PROVIDER_SITE_OTHER): Payer: Self-pay

## 2023-01-28 ENCOUNTER — Ambulatory Visit (INDEPENDENT_AMBULATORY_CARE_PROVIDER_SITE_OTHER): Payer: Medicare HMO | Admitting: Nurse Practitioner

## 2023-01-28 VITALS — BP 142/76 | HR 57 | Resp 16

## 2023-01-28 DIAGNOSIS — N186 End stage renal disease: Secondary | ICD-10-CM | POA: Diagnosis not present

## 2023-01-28 DIAGNOSIS — L97329 Non-pressure chronic ulcer of left ankle with unspecified severity: Secondary | ICD-10-CM

## 2023-01-28 DIAGNOSIS — Z992 Dependence on renal dialysis: Secondary | ICD-10-CM | POA: Diagnosis not present

## 2023-01-28 DIAGNOSIS — I83023 Varicose veins of left lower extremity with ulcer of ankle: Secondary | ICD-10-CM

## 2023-01-28 NOTE — Progress Notes (Unsigned)
Patient came in today for wound dressing change for left lower extremity. Patient informed that he has some blisters and drainage with right lower extremity.  The left leg wound was cleaned and wound vac was applied. The foot was dressed with aquacel, 4x4, kerlix, and abd. A unna wrap was place on the right lower extremity. Patient will return next week for bilateral dressing changes.

## 2023-01-29 DIAGNOSIS — Z992 Dependence on renal dialysis: Secondary | ICD-10-CM | POA: Diagnosis not present

## 2023-01-29 DIAGNOSIS — N186 End stage renal disease: Secondary | ICD-10-CM | POA: Diagnosis not present

## 2023-01-29 DIAGNOSIS — N2581 Secondary hyperparathyroidism of renal origin: Secondary | ICD-10-CM | POA: Diagnosis not present

## 2023-01-30 ENCOUNTER — Encounter (INDEPENDENT_AMBULATORY_CARE_PROVIDER_SITE_OTHER): Payer: Self-pay | Admitting: Nurse Practitioner

## 2023-02-01 DIAGNOSIS — I499 Cardiac arrhythmia, unspecified: Secondary | ICD-10-CM | POA: Diagnosis not present

## 2023-02-01 DIAGNOSIS — E1122 Type 2 diabetes mellitus with diabetic chronic kidney disease: Secondary | ICD-10-CM | POA: Diagnosis not present

## 2023-02-01 DIAGNOSIS — Z992 Dependence on renal dialysis: Secondary | ICD-10-CM | POA: Diagnosis not present

## 2023-02-01 DIAGNOSIS — N2581 Secondary hyperparathyroidism of renal origin: Secondary | ICD-10-CM | POA: Diagnosis not present

## 2023-02-01 DIAGNOSIS — N186 End stage renal disease: Secondary | ICD-10-CM | POA: Diagnosis not present

## 2023-02-01 DIAGNOSIS — I120 Hypertensive chronic kidney disease with stage 5 chronic kidney disease or end stage renal disease: Secondary | ICD-10-CM | POA: Diagnosis not present

## 2023-02-01 DIAGNOSIS — E1152 Type 2 diabetes mellitus with diabetic peripheral angiopathy with gangrene: Secondary | ICD-10-CM | POA: Diagnosis not present

## 2023-02-01 DIAGNOSIS — I872 Venous insufficiency (chronic) (peripheral): Secondary | ICD-10-CM | POA: Diagnosis not present

## 2023-02-01 DIAGNOSIS — I890 Lymphedema, not elsewhere classified: Secondary | ICD-10-CM | POA: Diagnosis not present

## 2023-02-01 DIAGNOSIS — E1165 Type 2 diabetes mellitus with hyperglycemia: Secondary | ICD-10-CM | POA: Diagnosis not present

## 2023-02-01 DIAGNOSIS — J45909 Unspecified asthma, uncomplicated: Secondary | ICD-10-CM | POA: Diagnosis not present

## 2023-02-02 ENCOUNTER — Ambulatory Visit: Payer: Self-pay | Admitting: *Deleted

## 2023-02-02 ENCOUNTER — Encounter: Payer: Self-pay | Admitting: Vascular Surgery

## 2023-02-02 NOTE — Patient Outreach (Signed)
  Care Coordination   02/02/2023 Name: [name] MRN: [id number] DOB: [birth year]   Care Coordination Outreach Attempts:  An unsuccessful telephone outreach was attempted for a scheduled appointment today.  Follow Up Plan:  Additional outreach attempts will be made to offer the patient care coordination information and services.   Encounter Outcome:  No Answer   Care Coordination Interventions:  No, not indicated    Kemper Durie, RN, MSN, [facility] Care Management Care Management Coordinator [phone]

## 2023-02-03 DIAGNOSIS — Z992 Dependence on renal dialysis: Secondary | ICD-10-CM | POA: Diagnosis not present

## 2023-02-03 DIAGNOSIS — N2581 Secondary hyperparathyroidism of renal origin: Secondary | ICD-10-CM | POA: Diagnosis not present

## 2023-02-03 DIAGNOSIS — N186 End stage renal disease: Secondary | ICD-10-CM | POA: Diagnosis not present

## 2023-02-04 ENCOUNTER — Emergency Department
Admission: EM | Admit: 2023-02-04 | Discharge: 2023-02-04 | Disposition: A | Discharge status: Home / Self Care | Payer: Medicare HMO | Attending: Emergency Medicine | Admitting: Emergency Medicine

## 2023-02-04 ENCOUNTER — Encounter (INDEPENDENT_AMBULATORY_CARE_PROVIDER_SITE_OTHER): Payer: Self-pay | Admitting: Nurse Practitioner

## 2023-02-04 ENCOUNTER — Encounter (INDEPENDENT_AMBULATORY_CARE_PROVIDER_SITE_OTHER): Payer: Self-pay

## 2023-02-04 ENCOUNTER — Other Ambulatory Visit: Payer: Self-pay

## 2023-02-04 ENCOUNTER — Ambulatory Visit (INDEPENDENT_AMBULATORY_CARE_PROVIDER_SITE_OTHER): Payer: Medicare HMO | Admitting: Nurse Practitioner

## 2023-02-04 VITALS — BP 183/100 | HR 59 | Resp 20

## 2023-02-04 DIAGNOSIS — I83019 Varicose veins of right lower extremity with ulcer of unspecified site: Secondary | ICD-10-CM

## 2023-02-04 DIAGNOSIS — L03115 Cellulitis of right lower limb: Secondary | ICD-10-CM | POA: Diagnosis not present

## 2023-02-04 DIAGNOSIS — N186 End stage renal disease: Secondary | ICD-10-CM

## 2023-02-04 DIAGNOSIS — L97929 Non-pressure chronic ulcer of unspecified part of left lower leg with unspecified severity: Secondary | ICD-10-CM

## 2023-02-04 DIAGNOSIS — I83029 Varicose veins of left lower extremity with ulcer of unspecified site: Secondary | ICD-10-CM

## 2023-02-04 DIAGNOSIS — I890 Lymphedema, not elsewhere classified: Secondary | ICD-10-CM

## 2023-02-04 DIAGNOSIS — L97919 Non-pressure chronic ulcer of unspecified part of right lower leg with unspecified severity: Secondary | ICD-10-CM

## 2023-02-04 LAB — CBC WITH DIFFERENTIAL/PLATELET
Abs Immature Granulocytes: 0.02 10*3/uL (ref 0.00–0.07)
Basophils Absolute: 0 10*3/uL (ref 0.0–0.1)
Basophils Relative: 1 %
Eosinophils Absolute: 0.1 10*3/uL (ref 0.0–0.5)
Eosinophils Relative: 1 %
HCT: 38.3 % — ABNORMAL LOW (ref 39.0–52.0)
Hemoglobin: 11.9 g/dL — ABNORMAL LOW (ref 13.0–17.0)
Immature Granulocytes: 0 %
Lymphocytes Relative: 13 %
Lymphs Abs: 0.9 10*3/uL (ref 0.7–4.0)
MCH: 29.2 pg (ref 26.0–34.0)
MCHC: 31.1 g/dL (ref 30.0–36.0)
MCV: 94.1 fL (ref 80.0–100.0)
Monocytes Absolute: 0.7 10*3/uL (ref 0.1–1.0)
Monocytes Relative: 11 %
Neutro Abs: 4.9 10*3/uL (ref 1.7–7.7)
Neutrophils Relative %: 74 %
Platelets: 206 10*3/uL (ref 150–400)
RBC: 4.07 MIL/uL — ABNORMAL LOW (ref 4.22–5.81)
RDW: 15 % (ref 11.5–15.5)
WBC: 6.6 10*3/uL (ref 4.0–10.5)
nRBC: 0 % (ref 0.0–0.2)

## 2023-02-04 LAB — COMPREHENSIVE METABOLIC PANEL
ALT: 10 U/L (ref 0–44)
AST: 17 U/L (ref 15–41)
Albumin: 3.5 g/dL (ref 3.5–5.0)
Alkaline Phosphatase: 56 U/L (ref 38–126)
Anion gap: 12 (ref 5–15)
BUN: 40 mg/dL — ABNORMAL HIGH (ref 6–20)
CO2: 28 mmol/L (ref 22–32)
Calcium: 10.2 mg/dL (ref 8.9–10.3)
Chloride: 102 mmol/L (ref 98–111)
Creatinine, Ser: 9.83 mg/dL — ABNORMAL HIGH (ref 0.61–1.24)
GFR, Estimated: 6 mL/min — ABNORMAL LOW (ref >60)
Glucose, Bld: 110 mg/dL — ABNORMAL HIGH (ref 70–99)
Potassium: 4.2 mmol/L (ref 3.5–5.1)
Sodium: 142 mmol/L (ref 135–145)
Total Bilirubin: 0.7 mg/dL (ref 0.3–1.2)
Total Protein: 8.3 g/dL — ABNORMAL HIGH (ref 6.5–8.1)

## 2023-02-04 LAB — LACTIC ACID, PLASMA: Lactic Acid, Venous: 0.9 mmol/L (ref 0.5–1.9)

## 2023-02-04 MED ORDER — DOXYCYCLINE HYCLATE 100 MG PO CAPS: 100.0000 mg | ORAL_CAPSULE | Freq: Two times a day (BID) | ORAL | 0 refills | Status: AC

## 2023-02-04 NOTE — Discharge Instructions (Signed)
Please seek medical attention for any high fevers, chest pain, shortness of breath, change in behavior, persistent vomiting, bloody stool or any other new or concerning symptoms.  

## 2023-02-04 NOTE — ED Provider Notes (Signed)
[facility] Provider Note    Event Date/Time   First MD Initiated Contact with Patient 02/04/23 1839     (approximate)   History   Wound Infection   HPI  [name] is a 52 y.o. male presents to the emergency department today from vascular surgery because of concerns for possible infection to the right leg.  Patient is seen vascular surgery for wound to the left leg.  He states that he feels like the wound to his left leg is improving.  Started having blistering to his right leg a couple of weeks ago.  He denies any fevers.  States he did feel a little weak today.     Physical Exam   Triage Vital Signs: ED Triage Vitals  Enc Vitals Group     BP 02/04/23 1600 (!) 189/75     Pulse Rate 02/04/23 1600 (!) 58     Resp 02/04/23 1600 16     Temp 02/04/23 1600 97.9 F (36.6 C)     Temp Source 02/04/23 1600 Oral     SpO2 02/04/23 1600 96 %     Weight 02/04/23 1557 (!) 395 lb (179.2 kg)     Height 02/04/23 1557 6\' 4"  (1.93 m)     Head Circumference --      Peak Flow --      Pain Score 02/04/23 1557 0     Pain Loc --      Pain Edu? --      Excl. in GC? --     Most recent vital signs: Vitals:   02/04/23 1600  BP: (!) 189/75  Pulse: (!) 58  Resp: 16  Temp: 97.9 F (36.6 C)  SpO2: 96%   General: Awake, alert, oriented. CV:  Good peripheral perfusion. Regular rate and rhythm. Resp:  Normal effort. Lungs clear. Abd:  No distention.  Other:  Wound to right lower leg. Surrounding erythema.    ED Results / Procedures / Treatments   Labs (all labs ordered are listed, but only abnormal results are displayed) Labs Reviewed  COMPREHENSIVE METABOLIC PANEL - Abnormal; Notable for the following components:      Result Value   Glucose, Bld 110 (*)    BUN 40 (*)    Creatinine, Ser 9.83 (*)    Total Protein 8.3 (*)    GFR, Estimated 6 (*)    All other components within normal limits  CBC WITH DIFFERENTIAL/PLATELET - Abnormal; Notable for the  following components:   RBC 4.07 (*)    Hemoglobin 11.9 (*)    HCT 38.3 (*)    All other components within normal limits  LACTIC ACID, PLASMA  LACTIC ACID, PLASMA  URINALYSIS, ROUTINE W REFLEX MICROSCOPIC     EKG  None   RADIOLOGY None   PROCEDURES:  Critical Care performed: No   MEDICATIONS ORDERED IN ED: Medications - No data to display   IMPRESSION / MDM / ASSESSMENT AND PLAN / ED COURSE  I reviewed the triage vital signs and the nursing notes.                              Differential diagnosis includes, but is not limited to, cellulitis, sepsis  Patient's presentation is most consistent with acute presentation with potential threat to life or bodily function.   Patient presented to the emergency department today from vascular surgery clinic because of concerns for right lower extremity  wound.  On exam there is some surrounding erythema.  Patient is afebrile, no leukocytosis or lactic acidosis. At this time I think cellulitis likely. No concern for sepsis and feel it is reasonable for patient to trial oral antibiotics. Will give patient wound care follow up as well.  FINAL CLINICAL IMPRESSION(S) / ED DIAGNOSES   Final diagnoses:  Cellulitis of right lower extremity    Note:  This document was prepared using Dragon voice recognition software and may include unintentional dictation errors.    Phineas Semen, MD 02/04/23 [phone]

## 2023-02-04 NOTE — Progress Notes (Signed)
Subjective:    Patient ID: [name], male    DOB: 11-Aug-1971, 52 y.o.   MRN: [id number] No chief complaint on file.   [name] is a 52 year old male who presented today for wound changes of his left lower extremity.  The patient has had multiple debridements of this deep venous ulcerations with wound VAC changes.  He was previously showing notable improvement but last week he began to have small changes in his right lower extremity.  Today, he presented with significant foul-smelling drainage of the right lower extremity with worsening ulcerative and blistered area.  The left lower extremity continues to show good granulation however there is some worsening noted that was not present previously.  The patient also has some concerning symptoms possible for sepsis as he has been very ill and vomiting in the office.  However because he has not been feeling well the patient notes that he has not had his medications and he also notes that he has not been dialyzing well which also may be causing some of his issues.    Review of Systems  Cardiovascular:  Positive for leg swelling.  Gastrointestinal:  Positive for nausea and vomiting.  Skin:  Positive for wound.  Psychiatric/Behavioral:  The patient is nervous/anxious.   All other systems reviewed and are negative.      Objective:   Physical Exam  There were no vitals taken for this visit.  Past Medical History:  Diagnosis Date   Anemia    Chronic kidney disease    14% FUNCTION   Diabetes mellitus without complication (HCC)    Diabetic retinopathy (HCC)    Dyspnea    DOE   Dysrhythmia    End stage renal disease (HCC)    History of orthopnea    error wrong patietn   Hypercholesteremia    error   Hyperkalemia    error wrong patient   Hypertension    Long-term insulin use (HCC)    Lymphedema of leg    Metabolic encephalopathy    error   Microalbuminuria    MSSA (methicillin susceptible Staphylococcus aureus)    Obesity     PVD (peripheral vascular disease) (HCC)    Respiratory failure (HCC)    Error   Sepsis (HCC)    error   Sleep apnea    no CPAP   UTI (urinary tract infection)    error   Venous ulcer (HCC)    Vitamin D deficiency     Social History   Socioeconomic History   Marital status: Divorced    Spouse name: Not on file   Number of children: Not on file   Years of education: Not on file   Highest education level: Not on file  Occupational History   Not on file  Tobacco Use   Smoking status: Former    Types: Cigars    Passive exposure: Past   Smokeless tobacco: Former    Quit date: 09/30/2017   Tobacco comments:    occasional  Vaping Use   Vaping Use: Never used  Substance and Sexual Activity   Alcohol use: Yes    Alcohol/week: 1.0 standard drink of alcohol    Types: 1 Cans of beer per week    Comment: beer   Drug use: Not Currently   Sexual activity: Yes  Other Topics Concern   Not on file  Social History Narrative   Lives alone.  Significant other, Debbie.    Social Determinants of Health  Financial Resource Strain: Not on file  Food Insecurity: No Food Insecurity (12/03/2022)   Hunger Vital Sign    Worried About Running Out of Food in the Last Year: Never true    Ran Out of Food in the Last Year: Never true  Transportation Needs: No Transportation Needs (12/03/2022)   PRAPARE - Administrator, Civil Service (Medical): No    Lack of Transportation (Non-Medical): No  Physical Activity: Not on file  Stress: Not on file  Social Connections: Not on file  Intimate Partner Violence: Not At Risk (11/10/2022)   Humiliation, Afraid, Rape, and Kick questionnaire    Fear of Current or Ex-Partner: No    Emotionally Abused: No    Physically Abused: No    Sexually Abused: No    Past Surgical History:  Procedure Laterality Date   A/V FISTULAGRAM Right 10/24/2018   Procedure: A/V FISTULAGRAM;  Surgeon: Renford Dills, MD;  Location: [facility];   Service: Cardiovascular;  Laterality: Right;   A/V FISTULAGRAM Right 11/24/2018   Procedure: A/V FISTULAGRAM;  Surgeon: Renford Dills, MD;  Location: [facility];  Service: Cardiovascular;  Laterality: Right;   A/V FISTULAGRAM Left 07/31/2019   Procedure: A/V FISTULAGRAM;  Surgeon: Renford Dills, MD;  Location: [facility];  Service: Cardiovascular;  Laterality: Left;   A/V FISTULAGRAM Left 12/11/2019   Procedure: A/V FISTULAGRAM;  Surgeon: Renford Dills, MD;  Location: [facility];  Service: Cardiovascular;  Laterality: Left;   A/V SHUNT INTERVENTION Right 02/21/2019   Procedure: A/V SHUNT INTERVENTION;  Surgeon: Renford Dills, MD;  Location: [facility];  Service: Cardiovascular;  Laterality: Right;   APPLICATION OF WOUND VAC Left 10/03/2017   Procedure: APPLICATION OF WOUND VAC;  Surgeon: Annice Needy, MD;  Location: [facility];  Service: General;  Laterality: Left;   APPLICATION OF WOUND VAC Left 10/11/2017   Procedure: APPLICATION OF WOUND VAC;  Surgeon: Renford Dills, MD;  Location: [facility];  Service: Vascular;  Laterality: Left;   APPLICATION OF WOUND VAC Left 10/14/2017   Procedure: WOUND VAC CHANGE;  Surgeon: Renford Dills, MD;  Location: [facility];  Service: Vascular;  Laterality: Left;  left lower leg   APPLICATION OF WOUND VAC Left 10/18/2017   Procedure: WOUND VAC CHANGE;  Surgeon: Renford Dills, MD;  Location: [facility];  Service: Vascular;  Laterality: Left;   APPLICATION OF WOUND VAC Left 10/07/2017   Procedure: APPLICATION OF WOUND VAC;  Surgeon: Renford Dills, MD;  Location: [facility];  Service: Vascular;  Laterality: Left;   APPLICATION OF WOUND VAC Left 11/17/2022   Procedure: Wound vac exchange;  Surgeon: Renford Dills, MD;  Location: [facility];  Service: Vascular;  Laterality: Left;   APPLICATION OF WOUND VAC Left 11/10/2022   Procedure: APPLICATION OF WOUND VAC;  Surgeon: Renford Dills, MD;  Location:  [facility];  Service: Vascular;  Laterality: Left;   APPLICATION OF WOUND VAC Left 11/24/2022   Procedure: WOUND VAC REMOVAL;  Surgeon: Renford Dills, MD;  Location: [facility];  Service: Vascular;  Laterality: Left;   APPLICATION OF WOUND VAC Left 11/12/2022   Procedure: APPLICATION OF WOUND VAC;  Surgeon: Renford Dills, MD;  Location: [facility];  Service: Vascular;  Laterality: Left;   APPLICATION OF WOUND VAC Left 01/06/2023   Procedure: APPLICATION OF WOUND VAC;  Surgeon: Renford Dills, MD;  Location: [facility];  Service: Vascular;  Laterality: Left;   AV FISTULA INSERTION W/ RF MAGNETIC GUIDANCE Right 07/19/2018   Procedure: AV FISTULA INSERTION W/RF MAGNETIC GUIDANCE;  Surgeon: Renford Dills, MD;  Location: [facility];  Service: Cardiovascular;  Laterality: Right;   AV FISTULA PLACEMENT Left 05/11/2019   Procedure: ARTERIOVENOUS (AV) FISTULA CREATION (RADIOCEPHALIC );  Surgeon: Renford Dills, MD;  Location: [facility];  Service: Vascular;  Laterality: Left;   AV FISTULA PLACEMENT Left 02/20/2020   Procedure: INSERTION OF ARTERIOVENOUS (AV) GORE-TEX GRAFT LEFT ARM ( FOREARM LOOP );  Surgeon: Renford Dills, MD;  Location: [facility];  Service: Vascular;  Laterality: Left;   CATARACT EXTRACTION W/PHACO Left 11/01/2018   Procedure: CATARACT EXTRACTION PHACO AND INTRAOCULAR LENS PLACEMENT (IOC)-LEFT, DIABETIC-INSULIN DEPENDENT;  Surgeon: Nevada Crane, MD;  Location: [facility];  Service: Ophthalmology;  Laterality: Left;  [place] 00:41.8 CDE 4.61 Fluid Pack Lot # [id number]   CATARACT EXTRACTION W/PHACO Right 04/27/2019   Procedure: CATARACT EXTRACTION PHACO AND INTRAOCULAR LENS PLACEMENT (IOC);  Surgeon: Nevada Crane, MD;  Location: [facility];  Service: Ophthalmology;  Laterality: Right;  [place] 00:34 CDE 1.97 FLUID PACK LOT # [id number]    COLONOSCOPY WITH PROPOFOL N/A 05/12/2020   Procedure: COLONOSCOPY WITH PROPOFOL;  Surgeon: Toledo, Boykin Nearing, MD;  Location: [facility];  Service: Gastroenterology;  Laterality: N/A;   DIALYSIS/PERMA CATHETER INSERTION N/A 02/23/2019   Procedure: DIALYSIS/PERMA CATHETER INSERTION;  Surgeon: Annice Needy, MD;  Location: [facility];  Service: Cardiovascular;  Laterality: N/A;   DIALYSIS/PERMA CATHETER REMOVAL N/A 04/03/2020   Procedure: DIALYSIS/PERMA CATHETER REMOVAL;  Surgeon: Renford Dills, MD;  Location: [facility];  Service: Cardiovascular;  Laterality: N/A;   DIALYSIS/PERMA CATHETER REMOVAL N/A 02/03/2022   Procedure: DIALYSIS/PERMA CATHETER REMOVAL;  Surgeon: Renford Dills, MD;  Location: [facility];  Service: Cardiovascular;  Laterality: N/A;   I & D EXTREMITY Left 10/11/2017   Procedure: IRRIGATION AND DEBRIDEMENT EXTREMITY;  Surgeon: Renford Dills, MD;  Location: [facility];  Service: Vascular;  Laterality: Left;   INCISION AND DRAINAGE ABSCESS Right 10/07/2017   Procedure: INCISION AND DRAINAGE ABSCESS;  Surgeon: Renford Dills, MD;  Location: [facility];  Service: Vascular;  Laterality: Right;   INCISION AND DRAINAGE OF WOUND Left 11/17/2022   Procedure: DEBRIDEMENT WOUND;  Surgeon: Renford Dills, MD;  Location: [facility];  Service: Vascular;  Laterality: Left;   INCISION AND DRAINAGE OF WOUND Left 11/24/2022   Procedure: DEBRIDEMENT WOUND LEFT LOWER EXTREMITY;  Surgeon: Renford Dills, MD;  Location: [facility];  Service: Vascular;  Laterality: Left;   IRRIGATION AND DEBRIDEMENT ABSCESS Left 10/03/2017   Procedure: IRRIGATION AND DEBRIDEMENT ABSCESS with debridement of skin, soft tissue, muscle 50sq cm;  Surgeon: Annice Needy, MD;  Location: [facility];  Service: General;  Laterality: Left;   LOWER EXTREMITY ANGIOGRAPHY Left 10/15/2022   Procedure: Lower Extremity Angiography;  Surgeon: Renford Dills, MD;  Location: [facility];  Service: Cardiovascular;  Laterality: Left;   TEMPORARY DIALYSIS CATHETER  02/20/2019   Procedure: TEMPORARY DIALYSIS CATHETER;   Surgeon: Renford Dills, MD;  Location: [facility];  Service: Cardiovascular;;   UPPER EXTREMITY ANGIOGRAPHY Left 09/18/2019   Procedure: UPPER EXTREMITY ANGIOGRAPHY;  Surgeon: Renford Dills, MD;  Location: [facility];  Service: Cardiovascular;  Laterality: Left;   WOUND DEBRIDEMENT Left 11/10/2022   Procedure: DEBRIDEMENT WOUND;  Surgeon: Renford Dills, MD;  [facility];  Service: Vascular;  Laterality: Left;   WOUND DEBRIDEMENT Left 11/12/2022   Procedure: DEBRIDEMENT WOUND;  Surgeon: Renford Dills, MD;  Location: [facility];  Service: Vascular;  Laterality: Left;   WOUND DEBRIDEMENT Left 01/06/2023   Procedure: DEBRIDEMENT WOUND;  Surgeon: Renford Dills, MD;  Location: [facility];  Service: Vascular;  Laterality: Left;    Family History  Problem Relation Age of Onset   Diabetes Father    Kidney disease Father    Diabetes Daughter     Allergies  Allergen Reactions   Lisinopril Swelling    Lips mouth    Furosemide Cough    Light headed, blurry vision, weakness.   Latex Itching   Adhesive [Tape] Itching and Rash       Latest Ref Rng & Units 01/06/2023   10:53 AM 11/25/2022    8:44 AM 11/23/2022    8:38 AM  CBC  WBC 4.0 - 10.5 K/uL 17.0  4.7  5.7   Hemoglobin 13.0 - 17.0 g/dL 16.1  9.5  9.2   Hematocrit 39.0 - 52.0 % 35.9  30.6  29.0   Platelets 150 - 400 K/uL 235  156  161       CMP     Component Value Date/Time   NA 137 01/06/2023 1053   K 3.5 01/06/2023 1053   CL 96 (L) 01/06/2023 1053   CO2 28 01/06/2023 1053   GLUCOSE 100 (H) 01/06/2023 1053   BUN 37 (H) 01/06/2023 1053   CREATININE 9.07 (H) 01/06/2023 1053   CALCIUM 9.9 01/06/2023 1053   PROT 7.1 11/24/2022 0939   ALBUMIN 3.2 (L) 11/25/2022 0844   AST 16 11/24/2022 0939   ALT 7 11/24/2022 0939   ALKPHOS 46 11/24/2022 0939   BILITOT 0.5 11/24/2022 0939   GFRNONAA 6 (L) 01/06/2023 1053     No results found.     Assessment & Plan:   1. Lymphedema The patient  has known lymphedema which is certainly worsening his wounds and driving repeated episodes of infection.  2. Venous ulcers of both lower extremities (HCC) The patient has a new development of an ulceration on his right with significant foul-smelling drainage.  Given his history I am concerned that there is a much deeper underlying infection and that some of his symptoms today may be related to early septic changes.  Given given this and the patient's presentation, I have recommended that he present to the emergency room for evaluation today.  3. ESRD (end stage renal disease) (HCC) The patient notes that he has not been meeting adequacy on dialysis and I suspect this may also be a cause for some of his vomiting and illness as well.   Current Outpatient Medications on File Prior to Visit  Medication Sig Dispense Refill   ACCU-CHEK GUIDE test strip USE 1 STRIP TOTAL 2 TIMES DAILY FOR 90 DAYS     acetaminophen (TYLENOL) 325 MG tablet Take 2 tablets (650 mg total) by mouth every 6 (six) hours as needed for mild pain (or Fever >/= 101).     allopurinol (ZYLOPRIM) 100 MG tablet Take 100 mg by mouth daily.     amLODipine (NORVASC) 10 MG tablet Take 5 mg by mouth daily as needed (if bp is 190/90 or higher).      apixaban (ELIQUIS) 5 MG TABS tablet Take 1 tablet (5 mg total) by mouth 2 (two) times daily. 60 tablet 3   atenolol (TENORMIN) 50 MG tablet Take 25 mg by  mouth daily as needed (if bp is 190/90 or higher).      atorvastatin (LIPITOR) 10 MG tablet Take 1 tablet (10 mg total) by mouth daily. Further refills per primary care 30 tablet 2   B Complex-C-Folic Acid (RENA-VITE RX) 1 MG TABS Take 1 tablet by mouth 3 (three) times a week.      calcium acetate (PHOSLO) 667 MG capsule Take 1,334-2,001 mg by mouth See admin instructions. Take 2001 mg by mouth with each meal & take 1334 mg by mouth with each snack.     cinacalcet (SENSIPAR) 30 MG tablet Take 30 mg by mouth daily.     CINNAMON PO Take 500 mg by  mouth 2 (two) times daily.      ciprofloxacin (CIPRO) 500 MG tablet Take 1 tablet (500 mg total) by mouth daily with breakfast. 10 tablet 0   clopidogrel (PLAVIX) 75 MG tablet TAKE 1 TABLET BY MOUTH EVERY DAY 90 tablet 1   Continuous Glucose Sensor (DEXCOM G7 SENSOR) MISC USE 1 EACH EVERY 10 (TEN) DAYS     Insulin Glargine (LANTUS SOLOSTAR) 100 UNIT/ML Solostar Pen Inject 15 Units into the skin at bedtime.      lidocaine-prilocaine (EMLA) cream Apply 1 application topically as needed (port access).     Multiple Vitamin (MULTIVITAMIN WITH MINERALS) TABS tablet Take 1 tablet by mouth 3 (three) times a week. Men's Multivitamin     Multiple Vitamins-Minerals (EYE HEALTH PO) Take 1 capsule by mouth daily.      oxyCODONE (OXY IR/ROXICODONE) 5 MG immediate release tablet Take 1 tablet (5 mg total) by mouth every 6 (six) hours as needed for moderate pain or severe pain. 30 tablet 0   Polyethyl Glycol-Propyl Glycol (SYSTANE OP) Place 1 drop into both eyes daily as needed (dry eyes).     sildenafil (REVATIO) 20 MG tablet Take 1 tablet daily.  Do not take with nitrates. 30 tablet 11   traZODone (DESYREL) 50 MG tablet Take 50 mg by mouth at bedtime.     No current facility-administered medications on file prior to visit.    There are no Patient Instructions on file for this visit. No follow-ups on file.   Georgiana Spinner, NP

## 2023-02-04 NOTE — ED Triage Notes (Signed)
Pt to ed from PCP office today at vascular clinic and they advised him to come here to be seen for possible infection. Pt is caox4, in no acute distress in triage. Pt denies any fever, chills, N/V.

## 2023-02-04 NOTE — ED Notes (Signed)
Unsuccessful attempt to get labs. Called lab for labdraw.

## 2023-02-05 DIAGNOSIS — N186 End stage renal disease: Secondary | ICD-10-CM | POA: Diagnosis not present

## 2023-02-05 DIAGNOSIS — Z992 Dependence on renal dialysis: Secondary | ICD-10-CM | POA: Diagnosis not present

## 2023-02-07 ENCOUNTER — Ambulatory Visit (INDEPENDENT_AMBULATORY_CARE_PROVIDER_SITE_OTHER): Payer: Medicare HMO | Admitting: Vascular Surgery

## 2023-02-07 ENCOUNTER — Encounter (INDEPENDENT_AMBULATORY_CARE_PROVIDER_SITE_OTHER): Payer: Self-pay | Admitting: Vascular Surgery

## 2023-02-07 VITALS — BP 188/81 | HR 61 | Resp 18 | Ht 76.0 in | Wt 392.0 lb

## 2023-02-07 DIAGNOSIS — I872 Venous insufficiency (chronic) (peripheral): Secondary | ICD-10-CM

## 2023-02-07 DIAGNOSIS — N186 End stage renal disease: Secondary | ICD-10-CM

## 2023-02-07 DIAGNOSIS — I10 Essential (primary) hypertension: Secondary | ICD-10-CM

## 2023-02-07 DIAGNOSIS — L97302 Non-pressure chronic ulcer of unspecified ankle with fat layer exposed: Secondary | ICD-10-CM | POA: Diagnosis not present

## 2023-02-07 DIAGNOSIS — E1165 Type 2 diabetes mellitus with hyperglycemia: Secondary | ICD-10-CM | POA: Diagnosis not present

## 2023-02-07 DIAGNOSIS — Z794 Long term (current) use of insulin: Secondary | ICD-10-CM | POA: Diagnosis not present

## 2023-02-07 DIAGNOSIS — I83003 Varicose veins of unspecified lower extremity with ulcer of ankle: Secondary | ICD-10-CM

## 2023-02-07 NOTE — Progress Notes (Signed)
MRN : [id number]  [name] is a 52 y.o. ([birth year]) male who presents with chief complaint of legs hurt and swell.  History of Present Illness:   The patient presented today for wound changes of his left and right lower extremity. The patient has had multiple debridements of this deep left venous ulcerations with wound VAC changes. He was previously showing notable improvement on the left.  Approximately 2 weeks ago he began to have small changes in his right lower extremity.  Last week, he presented with significant foul-smelling drainage of the right lower extremity with worsening ulcerative and blistered area. The left lower extremity continues to show good granulation however there is some worsening noted that was not present previously. The patient was sent to the emergency room where he was treated with antibiotics and subsequently discharged on oral doxycycline.  Today he returns to the office feeling much better.  He denies fever or chills.  He denies nausea vomiting.  He states his wounds have been doing much better.   No outpatient medications have been marked as taking for the 02/07/23 encounter (Appointment) with Gilda Crease, Latina Craver, MD.    Past Medical History:  Diagnosis Date   Anemia    Chronic kidney disease    14% FUNCTION   Diabetes mellitus without complication (HCC)    Diabetic retinopathy (HCC)    Dyspnea    DOE   Dysrhythmia    End stage renal disease (HCC)    History of orthopnea    error wrong patietn   Hypercholesteremia    error   Hyperkalemia    error wrong patient   Hypertension    Long-term insulin use (HCC)    Lymphedema of leg    Metabolic encephalopathy    error   Microalbuminuria    MSSA (methicillin susceptible Staphylococcus aureus)    Obesity    PVD (peripheral vascular disease) (HCC)    Respiratory failure (HCC)    Error   Sepsis (HCC)    error   Sleep apnea    no CPAP   UTI (urinary tract infection)    error   Venous  ulcer (HCC)    Vitamin D deficiency     Past Surgical History:  Procedure Laterality Date   A/V FISTULAGRAM Right 10/24/2018   Procedure: A/V FISTULAGRAM;  Surgeon: Renford Dills, MD;  Location: [facility];  Service: Cardiovascular;  Laterality: Right;   A/V FISTULAGRAM Right 11/24/2018   Procedure: A/V FISTULAGRAM;  Surgeon: Renford Dills, MD;  Location: [facility];  Service: Cardiovascular;  Laterality: Right;   A/V FISTULAGRAM Left 07/31/2019   Procedure: A/V FISTULAGRAM;  Surgeon: Renford Dills, MD;  Location: [facility];  Service: Cardiovascular;  Laterality: Left;   A/V FISTULAGRAM Left 12/11/2019   Procedure: A/V FISTULAGRAM;  Surgeon: Renford Dills, MD;  Location: [facility];  Service: Cardiovascular;  Laterality: Left;   A/V SHUNT INTERVENTION Right 02/21/2019   Procedure: A/V SHUNT INTERVENTION;  Surgeon: Renford Dills, MD;  Location: [facility];  Service: Cardiovascular;  Laterality: Right;   APPLICATION OF WOUND VAC Left 10/03/2017   Procedure: APPLICATION OF WOUND VAC;  Surgeon: Annice Needy, MD;  Location: [facility];  Service: General;  Laterality: Left;   APPLICATION OF WOUND VAC Left 10/11/2017   Procedure: APPLICATION OF WOUND VAC;  Surgeon: Renford Dills, MD;  Location: [facility];  Service: Vascular;  Laterality: Left;   APPLICATION OF WOUND VAC Left 10/14/2017   Procedure: WOUND VAC CHANGE;  Surgeon: Renford Dills, MD;  Location: [facility];  Service: Vascular;  Laterality: Left;  left lower leg   APPLICATION OF WOUND VAC Left 10/18/2017   Procedure: WOUND VAC CHANGE;  Surgeon: Renford Dills, MD;  Location: [facility];  Service: Vascular;  Laterality: Left;   APPLICATION OF WOUND VAC Left 10/07/2017   Procedure: APPLICATION OF WOUND VAC;  Surgeon: Renford Dills, MD;  Location: [facility];  Service: Vascular;  Laterality: Left;   APPLICATION OF WOUND VAC Left 11/17/2022   Procedure: Wound vac  exchange;  Surgeon: Renford Dills, MD;  Location: [facility];  Service: Vascular;  Laterality: Left;   APPLICATION OF WOUND VAC Left 11/10/2022   Procedure: APPLICATION OF WOUND VAC;  Surgeon: Renford Dills, MD;  Location: [facility];  Service: Vascular;  Laterality: Left;   APPLICATION OF WOUND VAC Left 11/24/2022   Procedure: WOUND VAC REMOVAL;  Surgeon: Renford Dills, MD;  Location: [facility];  Service: Vascular;  Laterality: Left;   APPLICATION OF WOUND VAC Left 11/12/2022   Procedure: APPLICATION OF WOUND VAC;  Surgeon: Renford Dills, MD;  Location: [facility];  Service: Vascular;  Laterality: Left;   APPLICATION OF WOUND VAC Left 01/06/2023   Procedure: APPLICATION OF WOUND VAC;  Surgeon: Renford Dills, MD;  Location: [facility];  Service: Vascular;  Laterality: Left;   AV FISTULA INSERTION W/ RF MAGNETIC GUIDANCE Right 07/19/2018   Procedure: AV FISTULA INSERTION W/RF MAGNETIC GUIDANCE;  Surgeon: Renford Dills, MD;  Location: [facility];  Service: Cardiovascular;  Laterality: Right;   AV FISTULA PLACEMENT Left 05/11/2019   Procedure: ARTERIOVENOUS (AV) FISTULA CREATION (RADIOCEPHALIC );  Surgeon: Renford Dills, MD;  Location: [facility];  Service: Vascular;  Laterality: Left;   AV FISTULA PLACEMENT Left 02/20/2020   Procedure: INSERTION OF ARTERIOVENOUS (AV) GORE-TEX GRAFT LEFT ARM ( FOREARM LOOP );  Surgeon: Renford Dills, MD;  Location: [facility];  Service: Vascular;  Laterality: Left;   CATARACT EXTRACTION W/PHACO Left 11/01/2018   Procedure: CATARACT EXTRACTION PHACO AND INTRAOCULAR LENS PLACEMENT (IOC)-LEFT, DIABETIC-INSULIN DEPENDENT;  Surgeon: Nevada Crane, MD;  Location: [facility];  Service: Ophthalmology;  Laterality: Left;  [place] 00:41.8 CDE 4.61 Fluid Pack Lot # [id number]   CATARACT EXTRACTION W/PHACO Right 04/27/2019   Procedure: CATARACT EXTRACTION PHACO AND INTRAOCULAR LENS PLACEMENT (IOC);  Surgeon: Nevada Crane, MD;  Location: [facility];   Service: Ophthalmology;  Laterality: Right;  [place] 00:34 CDE 1.97 FLUID PACK LOT # [id number]    COLONOSCOPY WITH PROPOFOL N/A 05/12/2020   Procedure: COLONOSCOPY WITH PROPOFOL;  Surgeon: [place], Boykin Nearing, MD;  Location: [facility];  Service: Gastroenterology;  Laterality: N/A;   DIALYSIS/PERMA CATHETER INSERTION N/A 02/23/2019   Procedure: DIALYSIS/PERMA CATHETER INSERTION;  Surgeon: Annice Needy, MD;  Location: [facility];  Service: Cardiovascular;  Laterality: N/A;   DIALYSIS/PERMA CATHETER REMOVAL N/A 04/03/2020   Procedure: DIALYSIS/PERMA CATHETER REMOVAL;  Surgeon: Renford Dills, MD;  Location: [facility];  Service: Cardiovascular;  Laterality: N/A;   DIALYSIS/PERMA CATHETER REMOVAL N/A 02/03/2022   Procedure: DIALYSIS/PERMA CATHETER REMOVAL;  Surgeon: Renford Dills, MD;  Location: [facility];  Service: Cardiovascular;  Laterality: N/A;   I & D EXTREMITY Left 10/11/2017   Procedure: IRRIGATION AND DEBRIDEMENT EXTREMITY;  Surgeon: Renford Dills, MD;  Location: [facility];  Service:  Vascular;  Laterality: Left;   INCISION AND DRAINAGE ABSCESS Right 10/07/2017   Procedure: INCISION AND DRAINAGE ABSCESS;  Surgeon: Renford Dills, MD;  Location: [facility];  Service: Vascular;  Laterality: Right;   INCISION AND DRAINAGE OF WOUND Left 11/17/2022   Procedure: DEBRIDEMENT WOUND;  Surgeon: Renford Dills, MD;  Location: [facility];  Service: Vascular;  Laterality: Left;   INCISION AND DRAINAGE OF WOUND Left 11/24/2022   Procedure: DEBRIDEMENT WOUND LEFT LOWER EXTREMITY;  Surgeon: Renford Dills, MD;  Location: [facility];  Service: Vascular;  Laterality: Left;   IRRIGATION AND DEBRIDEMENT ABSCESS Left 10/03/2017   Procedure: IRRIGATION AND DEBRIDEMENT ABSCESS with debridement of skin, soft tissue, muscle 50sq cm;  Surgeon: Annice Needy, MD;  Location: [facility];  Service: General;  Laterality: Left;   LOWER EXTREMITY ANGIOGRAPHY Left 10/15/2022   Procedure: Lower  Extremity Angiography;  Surgeon: Renford Dills, MD;  Location: [facility];  Service: Cardiovascular;  Laterality: Left;   TEMPORARY DIALYSIS CATHETER  02/20/2019   Procedure: TEMPORARY DIALYSIS CATHETER;  Surgeon: Renford Dills, MD;  Location: [facility];  Service: Cardiovascular;;   UPPER EXTREMITY ANGIOGRAPHY Left 09/18/2019   Procedure: UPPER EXTREMITY ANGIOGRAPHY;  Surgeon: Renford Dills, MD;  Location: [facility];  Service: Cardiovascular;  Laterality: Left;   WOUND DEBRIDEMENT Left 11/10/2022   Procedure: DEBRIDEMENT WOUND;  Surgeon: Renford Dills, MD;  Location: [facility];  Service: Vascular;  Laterality: Left;   WOUND DEBRIDEMENT Left 11/12/2022   Procedure: DEBRIDEMENT WOUND;  Surgeon: Renford Dills, MD;  Location: [facility];  Service: Vascular;  Laterality: Left;   WOUND DEBRIDEMENT Left 01/06/2023   Procedure: DEBRIDEMENT WOUND;  Surgeon: Renford Dills, MD;  Location: [facility];  Service: Vascular;  Laterality: Left;    Social History Social History   Tobacco Use   Smoking status: Former    Types: Cigars    Passive exposure: Past   Smokeless tobacco: Former    Quit date: 09/30/2017   Tobacco comments:    occasional  Vaping Use   Vaping Use: Never used  Substance Use Topics   Alcohol use: Yes    Alcohol/week: 1.0 standard drink of alcohol    Types: 1 Cans of beer per week    Comment: beer   Drug use: Not Currently    Family History Family History  Problem Relation Age of Onset   Diabetes Father    Kidney disease Father    Diabetes Daughter     Allergies  Allergen Reactions   Lisinopril Swelling    Lips mouth    Furosemide Cough    Light headed, blurry vision, weakness.   Latex Itching   Adhesive [Tape] Itching and Rash     REVIEW OF SYSTEMS (Negative unless checked)  Constitutional: [] Weight loss  [] Fever  [] Chills Cardiac: [] Chest pain   [] Chest pressure   [] Palpitations   [] Shortness of breath when  laying flat   [] Shortness of breath with exertion. Vascular:  [] Pain in legs with walking   [x] Pain in legs at rest  [] History of DVT   [] Phlebitis   [x] Swelling in legs   [] Varicose veins   [] Non-healing ulcers Pulmonary:   [] Uses home oxygen   [] Productive cough   [] Hemoptysis   [] Wheeze  [] COPD   [] Asthma Neurologic:  [] Dizziness   [] Seizures   [] History of stroke   [] History of TIA  [] Aphasia   [] Vissual changes   [] Weakness or numbness in  arm   [] Weakness or numbness in leg Musculoskeletal:   [] Joint swelling   [] Joint pain   [] Low back pain Hematologic:  [] Easy bruising  [] Easy bleeding   [] Hypercoagulable state   [] Anemic Gastrointestinal:  [] Diarrhea   [] Vomiting  [] Gastroesophageal reflux/heartburn   [] Difficulty swallowing. Genitourinary:  [] Chronic kidney disease   [] Difficult urination  [] Frequent urination   [] Blood in urine Skin:  [] Rashes   [] Ulcers  Psychological:  [] History of anxiety   []  History of major depression.  Physical Examination  There were no vitals filed for this visit. There is no height or weight on file to calculate BMI. Gen: WD/WN, NAD Head: [place]/AT, No temporalis wasting.  Ear/Nose/Throat: Hearing grossly intact, nares w/o erythema or drainage, pinna without lesions Eyes: PER, EOMI, sclera nonicteric.  Neck: Supple, no gross masses.  No JVD.  Pulmonary:  Good air movement, no audible wheezing, no use of accessory muscles.  Cardiac: RRR, precordium not hyperdynamic. Vascular:  scattered varicosities present bilaterally.  He has venous ulcers of both ankles.  On the right this appears clean there is no odor it is very superficial.  On the left he continues to have a better rich granulation tissue and is now showing significant wound contraction and continued improvement.  Severe venous stasis changes to the legs bilaterally.  4+ soft pitting edema. CEAP [id number]   Vessel Right Left  Radial Palpable Palpable  Gastrointestinal: soft, non-distended. No  guarding/no peritoneal signs.  Musculoskeletal: M/S 5/5 throughout.  No deformity.  Neurologic: CN 2-12 intact. Pain and light touch intact in extremities.  Symmetrical.  Speech is fluent. Motor exam as listed above. Psychiatric: Judgment intact, Mood & affect appropriate for pt's clinical situation. Dermatologic: Venous rashes no ulcers noted.  No changes consistent with cellulitis. Lymph : No lichenification or skin changes of chronic lymphedema.  CBC Lab Results  Component Value Date   WBC 6.6 02/04/2023   HGB 11.9 (L) 02/04/2023   HCT 38.3 (L) 02/04/2023   MCV 94.1 02/04/2023   PLT 206 02/04/2023    BMET    Component Value Date/Time   NA 142 02/04/2023 1640   K 4.2 02/04/2023 1640   CL 102 02/04/2023 1640   CO2 28 02/04/2023 1640   GLUCOSE 110 (H) 02/04/2023 1640   BUN 40 (H) 02/04/2023 1640   CREATININE 9.83 (H) 02/04/2023 1640   CALCIUM 10.2 02/04/2023 1640   GFRNONAA 6 (L) 02/04/2023 1640   GFRAA 9 (L) 02/19/2020 1316   Estimated Creatinine Clearance: 15.2 mL/min (A) (by C-G formula based on SCr of 9.83 mg/dL (H)).  COAG Lab Results  Component Value Date   INR 1.1 11/12/2022   INR 1.0 11/10/2022   INR 2.6 (H) 10/05/2022    Radiology No results found.   Assessment/Plan 1. Venous stasis ulcer of ankle with fat layer exposed, unspecified laterality, unspecified whether varicose veins present (HCC) No surgery or intervention at this point in time.    I have had a long discussion with the patient regarding venous insufficiency and why it  causes symptoms, specifically venous ulceration. I have discussed with the patient the chronic skin changes that accompany venous insufficiency and the long term sequela such as infection and recurring  ulceration.  Patient will be placed in an [employer] on the right today which will be changed weekly drainage permitting.  On the left leg we will continue with the [facility] he has brought his dressing supplies and we will apply the [facility]  before he leaves today.  I will extend his doxycycline therapy for a full 2 weeks, 14 days.  In addition, behavioral modification including several periods of elevation of the lower extremities during the day will be continued. Achieving a position with the ankles at heart level was stressed to the patient  The patient is instructed to begin routine exercise, especially walking on a daily basis  In the future the patient can be assessed for graduated compression stockings or wraps as well as a Lymph Pump once the ulcers are healed.  2. Chronic venous insufficiency See #1  3. Primary hypertension Continue antihypertensive medications as already ordered, these medications have been reviewed and there are no changes at this time.  4. Type 2 diabetes mellitus with hyperglycemia, with long-term current use of insulin (HCC) Continue hypoglycemic medications as already ordered, these medications have been reviewed and there are no changes at this time.  Hgb A1C to be monitored as already arranged by primary service  5. End-stage renal disease (HCC) Recommend:  The patient is doing well and currently has adequate dialysis access. The patient's dialysis center is not reporting any access issues. Flow pattern is stable when compared to the prior ultrasound.  The patient should have a duplex ultrasound of the dialysis access in 6 months.  The patient will follow-up with me in the office after each ultrasound      [name], MD  02/07/2023 1:47 PM

## 2023-02-08 ENCOUNTER — Encounter (INDEPENDENT_AMBULATORY_CARE_PROVIDER_SITE_OTHER): Payer: Self-pay | Admitting: Vascular Surgery

## 2023-02-08 DIAGNOSIS — I83003 Varicose veins of unspecified lower extremity with ulcer of ankle: Secondary | ICD-10-CM | POA: Insufficient documentation

## 2023-02-08 DIAGNOSIS — Z992 Dependence on renal dialysis: Secondary | ICD-10-CM | POA: Diagnosis not present

## 2023-02-08 DIAGNOSIS — N186 End stage renal disease: Secondary | ICD-10-CM | POA: Diagnosis not present

## 2023-02-08 DIAGNOSIS — N2581 Secondary hyperparathyroidism of renal origin: Secondary | ICD-10-CM | POA: Diagnosis not present

## 2023-02-08 MED ORDER — DOXYCYCLINE HYCLATE 100 MG PO CAPS: 100.0000 mg | ORAL_CAPSULE | Freq: Two times a day (BID) | ORAL | 2 refills | Status: DC

## 2023-02-09 ENCOUNTER — Telehealth (INDEPENDENT_AMBULATORY_CARE_PROVIDER_SITE_OTHER): Payer: Self-pay

## 2023-02-09 DIAGNOSIS — E1165 Type 2 diabetes mellitus with hyperglycemia: Secondary | ICD-10-CM | POA: Diagnosis not present

## 2023-02-09 DIAGNOSIS — I890 Lymphedema, not elsewhere classified: Secondary | ICD-10-CM | POA: Diagnosis not present

## 2023-02-09 DIAGNOSIS — E1122 Type 2 diabetes mellitus with diabetic chronic kidney disease: Secondary | ICD-10-CM | POA: Diagnosis not present

## 2023-02-09 DIAGNOSIS — I872 Venous insufficiency (chronic) (peripheral): Secondary | ICD-10-CM | POA: Diagnosis not present

## 2023-02-09 DIAGNOSIS — N186 End stage renal disease: Secondary | ICD-10-CM | POA: Diagnosis not present

## 2023-02-09 DIAGNOSIS — I120 Hypertensive chronic kidney disease with stage 5 chronic kidney disease or end stage renal disease: Secondary | ICD-10-CM | POA: Diagnosis not present

## 2023-02-09 DIAGNOSIS — I499 Cardiac arrhythmia, unspecified: Secondary | ICD-10-CM | POA: Diagnosis not present

## 2023-02-09 DIAGNOSIS — E1152 Type 2 diabetes mellitus with diabetic peripheral angiopathy with gangrene: Secondary | ICD-10-CM | POA: Diagnosis not present

## 2023-02-09 DIAGNOSIS — J45909 Unspecified asthma, uncomplicated: Secondary | ICD-10-CM | POA: Diagnosis not present

## 2023-02-09 NOTE — Telephone Encounter (Signed)
She should continue with unna wraps and reinforce that patient needs to elevate leg.  If he is soaking through his wraps it can be changed at the visit

## 2023-02-09 NOTE — Telephone Encounter (Signed)
Lawson Fiscal called for verbal orders for [name]'s right leg. Per [place] it was a zinc unna wrap weekly. Lawson Fiscal called today saying his right leg has a lot of drainage and with the orders he can get wrapped at his visits or should she do something in between.   Please advise

## 2023-02-09 NOTE — Telephone Encounter (Signed)
Lorrie from [place] left a message requesting orders for patient right lower extremity. I left a message for weekly unna wraps to place on right lower extremity.

## 2023-02-09 NOTE — Telephone Encounter (Signed)
Left a message on a secure voicemail

## 2023-02-10 DIAGNOSIS — N2581 Secondary hyperparathyroidism of renal origin: Secondary | ICD-10-CM | POA: Diagnosis not present

## 2023-02-10 DIAGNOSIS — Z992 Dependence on renal dialysis: Secondary | ICD-10-CM | POA: Diagnosis not present

## 2023-02-10 DIAGNOSIS — N186 End stage renal disease: Secondary | ICD-10-CM | POA: Diagnosis not present

## 2023-02-11 DIAGNOSIS — I499 Cardiac arrhythmia, unspecified: Secondary | ICD-10-CM | POA: Diagnosis not present

## 2023-02-11 DIAGNOSIS — I120 Hypertensive chronic kidney disease with stage 5 chronic kidney disease or end stage renal disease: Secondary | ICD-10-CM | POA: Diagnosis not present

## 2023-02-11 DIAGNOSIS — E1152 Type 2 diabetes mellitus with diabetic peripheral angiopathy with gangrene: Secondary | ICD-10-CM | POA: Diagnosis not present

## 2023-02-11 DIAGNOSIS — I890 Lymphedema, not elsewhere classified: Secondary | ICD-10-CM | POA: Diagnosis not present

## 2023-02-11 DIAGNOSIS — N186 End stage renal disease: Secondary | ICD-10-CM | POA: Diagnosis not present

## 2023-02-11 DIAGNOSIS — E1165 Type 2 diabetes mellitus with hyperglycemia: Secondary | ICD-10-CM | POA: Diagnosis not present

## 2023-02-11 DIAGNOSIS — E1122 Type 2 diabetes mellitus with diabetic chronic kidney disease: Secondary | ICD-10-CM | POA: Diagnosis not present

## 2023-02-11 DIAGNOSIS — J45909 Unspecified asthma, uncomplicated: Secondary | ICD-10-CM | POA: Diagnosis not present

## 2023-02-11 DIAGNOSIS — I872 Venous insufficiency (chronic) (peripheral): Secondary | ICD-10-CM | POA: Diagnosis not present

## 2023-02-11 NOTE — Telephone Encounter (Signed)
Lori from [place] reach out to the office requesting verbal orders to use calcium alginate with wrap until the unna wrap supplies are received. Verbal orders were fine by Vivia Birmingham NP. Also she stated that she has orders to see patient three times a week if that is fine with the provider. Orders will be discuss on Monday when patient arrives to the office.

## 2023-02-12 DIAGNOSIS — Z992 Dependence on renal dialysis: Secondary | ICD-10-CM | POA: Diagnosis not present

## 2023-02-12 DIAGNOSIS — N2581 Secondary hyperparathyroidism of renal origin: Secondary | ICD-10-CM | POA: Diagnosis not present

## 2023-02-12 DIAGNOSIS — N186 End stage renal disease: Secondary | ICD-10-CM | POA: Diagnosis not present

## 2023-02-14 ENCOUNTER — Ambulatory Visit (INDEPENDENT_AMBULATORY_CARE_PROVIDER_SITE_OTHER): Payer: Medicare HMO | Admitting: Vascular Surgery

## 2023-02-14 ENCOUNTER — Encounter (INDEPENDENT_AMBULATORY_CARE_PROVIDER_SITE_OTHER): Payer: Self-pay

## 2023-02-14 VITALS — BP 156/76 | HR 70 | Resp 16

## 2023-02-14 DIAGNOSIS — I83003 Varicose veins of unspecified lower extremity with ulcer of ankle: Secondary | ICD-10-CM | POA: Diagnosis not present

## 2023-02-14 DIAGNOSIS — L97312 Non-pressure chronic ulcer of right ankle with fat layer exposed: Secondary | ICD-10-CM

## 2023-02-14 NOTE — Telephone Encounter (Signed)
Verbal orders for patient to receive home health visits three times a week for wound vac changes and bi-weekly unna wrap were fine per Dr Gilda Crease to start. Lori from [place] was made aware with approved orders.

## 2023-02-14 NOTE — Progress Notes (Signed)
Patient was seen today for bilateral lower extremity wound dressing change. For the right lower extremity a unna wrap with aquacel was applied. The left lower extremity wound vac was reapplied and the foot was dressed with antibiotic ointment with band-Aid. The patient will follow up in three weeks with Dr Gilda Crease or Vivia Birmingham. Home health will start visits three times weekly visits for wound care

## 2023-02-15 DIAGNOSIS — N2581 Secondary hyperparathyroidism of renal origin: Secondary | ICD-10-CM | POA: Diagnosis not present

## 2023-02-15 DIAGNOSIS — N186 End stage renal disease: Secondary | ICD-10-CM | POA: Diagnosis not present

## 2023-02-15 DIAGNOSIS — Z992 Dependence on renal dialysis: Secondary | ICD-10-CM | POA: Diagnosis not present

## 2023-02-16 ENCOUNTER — Encounter: Payer: Medicare HMO | Attending: Internal Medicine | Admitting: Internal Medicine

## 2023-02-16 DIAGNOSIS — I120 Hypertensive chronic kidney disease with stage 5 chronic kidney disease or end stage renal disease: Secondary | ICD-10-CM | POA: Diagnosis not present

## 2023-02-16 DIAGNOSIS — E11622 Type 2 diabetes mellitus with other skin ulcer: Secondary | ICD-10-CM | POA: Insufficient documentation

## 2023-02-16 DIAGNOSIS — I872 Venous insufficiency (chronic) (peripheral): Secondary | ICD-10-CM | POA: Diagnosis not present

## 2023-02-16 DIAGNOSIS — J45909 Unspecified asthma, uncomplicated: Secondary | ICD-10-CM | POA: Diagnosis not present

## 2023-02-16 DIAGNOSIS — L97822 Non-pressure chronic ulcer of other part of left lower leg with fat layer exposed: Secondary | ICD-10-CM | POA: Diagnosis not present

## 2023-02-16 DIAGNOSIS — E1152 Type 2 diabetes mellitus with diabetic peripheral angiopathy with gangrene: Secondary | ICD-10-CM | POA: Diagnosis not present

## 2023-02-16 DIAGNOSIS — I499 Cardiac arrhythmia, unspecified: Secondary | ICD-10-CM | POA: Diagnosis not present

## 2023-02-16 DIAGNOSIS — E1165 Type 2 diabetes mellitus with hyperglycemia: Secondary | ICD-10-CM | POA: Diagnosis not present

## 2023-02-16 DIAGNOSIS — I890 Lymphedema, not elsewhere classified: Secondary | ICD-10-CM | POA: Diagnosis not present

## 2023-02-16 DIAGNOSIS — I87312 Chronic venous hypertension (idiopathic) with ulcer of left lower extremity: Secondary | ICD-10-CM | POA: Insufficient documentation

## 2023-02-16 DIAGNOSIS — N186 End stage renal disease: Secondary | ICD-10-CM | POA: Diagnosis not present

## 2023-02-16 DIAGNOSIS — I87321 Chronic venous hypertension (idiopathic) with inflammation of right lower extremity: Secondary | ICD-10-CM

## 2023-02-16 DIAGNOSIS — E1122 Type 2 diabetes mellitus with diabetic chronic kidney disease: Secondary | ICD-10-CM | POA: Diagnosis not present

## 2023-02-17 DIAGNOSIS — N2581 Secondary hyperparathyroidism of renal origin: Secondary | ICD-10-CM | POA: Diagnosis not present

## 2023-02-17 DIAGNOSIS — N186 End stage renal disease: Secondary | ICD-10-CM | POA: Diagnosis not present

## 2023-02-17 DIAGNOSIS — Z992 Dependence on renal dialysis: Secondary | ICD-10-CM | POA: Diagnosis not present

## 2023-02-17 NOTE — Progress Notes (Signed)
ALMON, OGAZ A ([id number]) [id number] Page 1 of 11 Visit Report for 02/16/2023 Allergy List Details Patient Name: Date of Service: [name], [name] 02/16/2023 8:45 A M Medical Record Number: [id number] Patient Account Number: [id number] Date of Birth/Sex: Treating RN: Apr 05, 1971 (52 y.o. [name]) [name] Primary Care Fallou Hulbert: [name] Other Clinician: Referring Dinesh Ulysse: Treating Kearra Calkin/Extender: Launa Grill Weeks in Treatment: 0 Allergies Active Allergies lisinopril latex tape, occlusive adhesive furosemide Allergy Notes Electronic Signature(s) Signed: 02/17/2023 12:58:51 PM By: [name] RN Entered By: [name] on 02/16/2023 09:18:22 -------------------------------------------------------------------------------- Arrival Information Details Patient Name: Date of Service: [name], [name] 02/16/2023 8:45 A M Medical Record Number: [id number] Patient Account Number: [id number] Date of Birth/Sex: Treating RN: Oct 30, 1970 (52 y.o. [name]) [name] Primary Care Triston Lisanti: [name] Other Clinician: Referring Daelynn Blower: Treating Jameela Michna/Extender: Channing Mutters in Treatment: 0 Visit Information Patient Arrived: Danella Maiers Time: 09:13 Accompanied By: partner Transfer Assistance: None Patient Identification Verified: Yes Secondary Verification Process Completed: Yes Patient Requires Transmission-Based Precautions: No Patient Has Alerts: Yes Patient Alerts: Patient on Blood Thinner Segler, Jaymarion A ([id number]) Patient Alerts: Patient on Blood Thinner ABI R [place] TBI .61 10/11/22 ABI L [place] TBI .33 10/11/22 History Since Last Visit Added or deleted any medications: No Any new allergies or adverse reactions: No Had a fall or experienced change in activities of daily living that may affect risk of falls: No Signs or symptoms of abuse/neglect since last visito No Hospitalized since last  visit: No Implantable device outside of the clinic excluding cellular tissue based products placed in the center since last visit: No [id number] Page 2 of 11 Has Dressing in Place as Prescribed: Yes Has Compression in Place as Prescribed: Yes Pain Present Now: No Electronic Signature(s) Signed: 02/17/2023 12:58:51 PM By: [name] RN Entered By: [name] on 02/16/2023 09:48:29 -------------------------------------------------------------------------------- Clinic Level of Care Assessment Details Patient Name: Date of Service: [name], [name] 02/16/2023 8:45 A M Medical Record Number: [id number] Patient Account Number: [id number] Date of Birth/Sex: Treating RN: 1970/11/07 (52 y.o. [name] Primary Care Densel Kronick: [name] Other Clinician: Referring Amber Williard: Treating Hillary Struss/Extender: Launa Grill Weeks in Treatment: 0 Clinic Level of Care Assessment Items TOOL 2 Quantity Score X- 1 0 Use when only an EandM is performed on the INITIAL visit ASSESSMENTS - Nursing Assessment / Reassessment X- 1 20 General Physical Exam (combine w/ comprehensive assessment (listed just below) when performed on new pt. evals) X- 1 25 Comprehensive Assessment (HX, ROS, Risk Assessments, Wounds Hx, etc.) ASSESSMENTS - Wound and Skin A ssessment / Reassessment []  - 0 Simple Wound Assessment / Reassessment - one wound X- 3 5 Complex Wound Assessment / Reassessment - multiple wounds []  - 0 Dermatologic / Skin Assessment (not related to wound area) ASSESSMENTS - Ostomy and/or Continence Assessment and Care []  - 0 Incontinence Assessment and Management []  - 0 Ostomy Care Assessment and Management (repouching, etc.) PROCESS - Coordination of Care X - Simple Patient / Family Education for ongoing care 1 15 []  - 0 Complex (extensive) Patient / Family Education for ongoing care []  - 0 Staff obtains Chiropractor, Records, T Results /  Process Orders est []  - 0 Staff telephones HHA, Nursing Homes / Clarify orders / etc []  - 0 Routine Transfer to another Facility (non-emergent condition) []  - 0 Routine Hospital Admission (non-emergent condition) []  - 0 New Admissions / Manufacturing engineer / Ordering NPWT Apligraf, etc. , []  -  0 Emergency Hospital Admission (emergent condition) X- 1 10 Simple Discharge Coordination []  - 0 Complex (extensive) Discharge Coordination PROCESS - Special Needs []  - 0 Pediatric / Minor Patient Management Rost, Mattthew A ([id number]) [id number] Page 3 of 11 []  - 0 Isolation Patient Management []  - 0 Hearing / Language / Visual special needs []  - 0 Assessment of Community assistance (transportation, D/C planning, etc.) []  - 0 Additional assistance / Altered mentation []  - 0 Support Surface(s) Assessment (bed, cushion, seat, etc.) INTERVENTIONS - Wound Cleansing / Measurement X- 1 5 Wound Imaging (photographs - any number of wounds) []  - 0 Wound Tracing (instead of photographs) []  - 0 Simple Wound Measurement - one wound X- 3 5 Complex Wound Measurement - multiple wounds []  - 0 Simple Wound Cleansing - one wound X- 3 5 Complex Wound Cleansing - multiple wounds INTERVENTIONS - Wound Dressings X - Small Wound Dressing one or multiple wounds 3 10 []  - 0 Medium Wound Dressing one or multiple wounds []  - 0 Large Wound Dressing one or multiple wounds []  - 0 Application of Medications - injection INTERVENTIONS - Miscellaneous []  - 0 External ear exam []  - 0 Specimen Collection (cultures, biopsies, blood, body fluids, etc.) []  - 0 Specimen(s) / Culture(s) sent or taken to Lab for analysis []  - 0 Patient Transfer (multiple staff / Nurse, adult / Similar devices) []  - 0 Simple Staple / Suture removal (25 or less) []  - 0 Complex Staple / Suture removal (26 or more) []  - 0 Hypo / Hyperglycemic Management (close monitor of Blood Glucose) []  -  0 Ankle / Brachial Index (ABI) - do not check if billed separately Has the patient been seen at the hospital within the last three years: Yes Total Score: 150 Level Of Care: New/Established - Level 4 Electronic Signature(s) Signed: 02/17/2023 12:58:51 PM By: [name] RN Entered By: [name] on 02/16/2023 11:34:02 -------------------------------------------------------------------------------- Encounter Discharge Information Details Patient Name: Date of Service: [name], [name] 02/16/2023 8:45 A M Medical Record Number: [id number] Patient Account Number: [id number] Date of Birth/Sex: Treating RN: [birth year] (52 y.o. [name] Primary Care Duwan Adrian: [name] Other Clinician: Referring Jayr Lupercio: Treating Jamari Moten/Extender: Launa Grill Weeks in Treatment: 0 Mccamy, Ladonte A ([id number]) [id number] Page 4 of 11 Encounter Discharge Information Items Discharge Condition: Stable Ambulatory Status: Cane Discharge Destination: Home Transportation: Private Auto Accompanied By: partner Schedule Follow-up Appointment: Yes Clinical Summary of Care: Electronic Signature(s) Signed: 02/16/2023 11:36:57 AM By: [name] RN Entered By: [name] on 02/16/2023 11:36:57 -------------------------------------------------------------------------------- General Visit Notes Details Patient Name: Date of Service: [name], [name] 02/16/2023 8:45 A M Medical Record Number: [id number] Patient Account Number: [id number] Date of Birth/Sex: Treating RN: Mar 14, 1971 (52 y.o. [name] Primary Care Edon Hoadley: [name] Other Clinician: Referring Noble Cicalese: Treating Geniva Lohnes/Extender: Channing Mutters in Treatment: 0 Notes patient being seen at Dr. Marijean Heath office for wound care to left and right leg. Dr Gilda Crease saw patient and requested he go to the emergency room for eval of right lower leg, while  at the ER patient placed on Doxycycline. The emergency room sent a ref over to the wound clinic. However patient continues to see Dr Lorretta Harp for wound care and was not aware of this ref, as per patient upon his arrival today . Thus a consult was done and Dr Mikey Bussing informed the patient insruted the patient to return to Dr Willette Cluster for wound care, dressing in the office  at this time were left leg : dakins wet to dry with tubi grip size F single layer and abd pad with tubi grip D single layer to right leg . patient reports he has home health that will be coming today to replace wound vac. Electronic Signature(s) Signed: 02/16/2023 11:44:41 AM By: [name] RN Entered By: [name] on 02/16/2023 11:44:41 -------------------------------------------------------------------------------- Lower Extremity Assessment Details Patient Name: Date of Service: [name], [name] 02/16/2023 8:45 A M Medical Record Number: [id number] Patient Account Number: [id number] Date of Birth/Sex: Treating RN: 08-29-1971 (52 y.o. [name] Primary Care Mayce Noyes: [name] Other Clinician: Referring Adit Riddles: Treating Zianna Dercole/Extender: Launa Grill Weeks in Treatment: 0 Edema Assessment Assessed: Kyra Searles: No] Franne Forts: No] [Left: Edema] [Right: :] H[Left: OLT, Gannon A ([id number])] [Right: [id number] Page 5 of 11] Calf Left: Right: Point of Measurement: 40 cm From Medial Instep 63 cm 47 cm Ankle Left: Right: Point of Measurement: 10 cm From Medial Instep 46 cm 34 cm Knee To Floor Left: Right: From Medial Instep 52 cm 52 cm Vascular Assessment Pulses: Dorsalis Pedis Palpable: [Left:Yes] [Right:Yes] Electronic Signature(s) Signed: 02/17/2023 12:58:51 PM By: [name] RN Entered By: [name] on 02/16/2023 09:46:50 -------------------------------------------------------------------------------- Multi Wound Chart Details Patient Name: Date of  Service: [name], [name] 02/16/2023 8:45 A M Medical Record Number: [id number] Patient Account Number: [id number] Date of Birth/Sex: Treating RN: [birth year] (52 y.o. [name]) [name] Primary Care Amelio Brosky: [name] Other Clinician: Referring Larken Urias: Treating Harlem Thresher/Extender: Launa Grill Weeks in Treatment: 0 Vital Signs Height(in): 76 Pulse(bpm): 75 Weight(lbs): 400 Blood Pressure(mmHg): 188/89 Body Mass Index(BMI): 48.7 Temperature(F): 98.3 Respiratory Rate(breaths/min): 18 [20:Photos:] Left, Lateral Lower Leg Left, Anterior Foot Left Lower Leg Wound Location: Gradually Appeared Gradually Appeared Gradually Appeared Wounding Event: Diabetic Wound/Ulcer of the Lower Diabetic Wound/Ulcer of the Lower Diabetic Wound/Ulcer of the Lower Primary Etiology: Extremity Extremity Extremity Hypertension, Type II Diabetes, Hypertension, Type II Diabetes, Hypertension, Type II Diabetes, Comorbid History: Neuropathy Neuropathy Neuropathy 12/29/2022 12/29/2022 08/30/2022 Date Acquired: 0 0 0 Weeks of Treatment: Open Open Open Wound Status: No No No Wound Recurrence: Krull, Dajaun A ([id number]) [id number] Page 6 of 11 0.8x0.7x0.1 2.2x2.5x0.3 11x10.5x0.6 Measurements L x W x D (cm) 0.44 4.32 90.713 A (cm) : rea 0.044 1.296 54.428 Volume (cm) : Grade 1 Grade 1 Grade 2 Classification: Medium Medium Medium Exudate A mount: Serosanguineous Serosanguineous Serosanguineous Exudate Type: red, brown red, brown red, brown Exudate Color: Medium (34-66%) Medium (34-66%) Large (67-100%) Granulation A mount: Red Red Red Granulation Quality: Medium (34-66%) Medium (34-66%) Small (1-33%) Necrotic A mount: Fascia: No Fat Layer (Subcutaneous Tissue): Yes Fat Layer (Subcutaneous Tissue): Yes Exposed Structures: Fat Layer (Subcutaneous Tissue): No Fascia: No Fascia: No Tendon: No Tendon: No Tendon: No Muscle: No Muscle: No Muscle:  No Joint: No Joint: No Joint: No Bone: No Bone: No Bone: No None None None Epithelialization: Treatment Notes Electronic Signature(s) Signed: 02/17/2023 12:49:28 PM By: Geralyn Corwin DO Entered By: Geralyn Corwin on 02/16/2023 10:50:45 -------------------------------------------------------------------------------- Multi-Disciplinary Care Plan Details Patient Name: Date of Service: [name], [name] 02/16/2023 8:45 A M Medical Record Number: [id number] Patient Account Number: [id number] Date of Birth/Sex: Treating RN: Mar 27, 1971 (52 y.o. [name] Primary Care Kasean Denherder: [name] Other Clinician: Referring Treyvin Glidden: Treating Angelica Wix/Extender: Launa Grill Weeks in Treatment: 0 Active Inactive Electronic Signature(s) Signed: 02/16/2023 11:35:55 AM By: [name] RN Entered By: [name] on 02/16/2023 11:35:55 -------------------------------------------------------------------------------- Pain Assessment  Details Patient Name: Date of Service: [name], [name] 02/16/2023 8:45 A M Medical Record Number: [id number] Patient Account Number: [id number] Date of Birth/Sex: Treating RN: 01-14-1971 (52 y.o. [name] Primary Care Terris Germano: [name] Other Clinician: Referring Marly Schuld: Treating Keyera Hattabaugh/Extender: Damarrius, Kulas A ([id number]) [phone].pdf Page 7 of 11 Weeks in Treatment: 0 Active Problems Location of Pain Severity and Description of Pain Patient Has Paino No Site Locations Pain Management and Medication Current Pain Management: Electronic Signature(s) Signed: 02/17/2023 12:58:51 PM By: [name] RN Entered By: [name] on 02/16/2023 09:16:26 -------------------------------------------------------------------------------- Patient/Caregiver Education Details Patient Name: Date of Service: [name], [name] 6/19/2024andnbsp8:45 A M Medical  Record Number: [id number] Patient Account Number: [id number] Date of Birth/Gender: Treating RN: May 08, 1971 (52 y.o. [name] Primary Care Physician: [name] Other Clinician: Referring Physician: Treating Physician/Extender: Channing Mutters in Treatment: 0 Education Assessment Education Provided To: Patient Education Topics Provided Welcome [facility] Packet: o Handouts: Welcome [facility] o Methods: Explain/Verbal Responses: State content correctly Wound/Skin Impairment: Electronic Signature(s) Signed: 02/17/2023 12:58:51 PM By: [name] RN Entered By: [name] on 02/16/2023 09:50:49 Walls, Ervin A ([id number]) [id number] Page 8 of 11 -------------------------------------------------------------------------------- Wound Assessment Details Patient Name: Date of Service: [name], [name] 02/16/2023 8:45 A M Medical Record Number: [id number] Patient Account Number: [id number] Date of Birth/Sex: Treating RN: Dec 30, 1970 (52 y.o. [name]) [name] Primary Care Desirai Traxler: [name] Other Clinician: Referring Fannye Myer: Treating Lorenza Winkleman/Extender: Launa Grill Weeks in Treatment: 0 Wound Status Wound Number: 20 Primary Etiology: Diabetic Wound/Ulcer of the Lower Extremity Wound Location: Left, Lateral Lower Leg Wound Status: Open Wounding Event: Gradually Appeared Comorbid History: Hypertension, Type II Diabetes, Neuropathy Date Acquired: 12/29/2022 Weeks Of Treatment: 0 Clustered Wound: No Photos Wound Measurements Length: (cm) 0.8 Width: (cm) 0.7 Depth: (cm) 0.1 Area: (cm) 0.44 Volume: (cm) 0.044 % Reduction in Area: % Reduction in Volume: Epithelialization: None Tunneling: No Undermining: No Wound Description Classification: Grade 1 Exudate Amount: Medium Exudate Type: Serosanguineous Exudate Color: red, brown Foul Odor After  Cleansing: No Slough/Fibrino Yes Wound Bed Granulation Amount: Medium (34-66%) Exposed Structure Granulation Quality: Red Fascia Exposed: No Necrotic Amount: Medium (34-66%) Fat Layer (Subcutaneous Tissue) Exposed: No Necrotic Quality: Adherent Slough Tendon Exposed: No Muscle Exposed: No Joint Exposed: No Bone Exposed: No Electronic Signature(s) Signed: 02/17/2023 12:58:51 PM By: [name] RN Entered By: [name] on 02/16/2023 09:42:56 Broker, Zaire A ([id number]) [id number] Page 9 of 11 -------------------------------------------------------------------------------- Wound Assessment Details Patient Name: Date of Service: [name], [name] 02/16/2023 8:45 A M Medical Record Number: [id number] Patient Account Number: [id number] Date of Birth/Sex: Treating RN: [birth year] (52 y.o. [name]) [name] Primary Care Kimberlly Norgard: [name] Other Clinician: Referring Acie Custis: Treating Tauni Sanks/Extender: Launa Grill Weeks in Treatment: 0 Wound Status Wound Number: 21 Primary Etiology: Diabetic Wound/Ulcer of the Lower Extremity Wound Location: Left, Anterior Foot Wound Status: Open Wounding Event: Gradually Appeared Comorbid History: Hypertension, Type II Diabetes, Neuropathy Date Acquired: 12/29/2022 Weeks Of Treatment: 0 Clustered Wound: No Photos Wound Measurements Length: (cm) 2.2 Width: (cm) 2.5 Depth: (cm) 0.3 Area: (cm) 4.32 Volume: (cm) 1.296 % Reduction in Area: % Reduction in Volume: Epithelialization: None Tunneling: No Undermining: No Wound Description Classification: Grade 1 Exudate Amount: Medium Exudate Type: Serosanguineous Exudate Color: red, brown Foul Odor After Cleansing: No Slough/Fibrino Yes Wound Bed Granulation Amount: Medium (34-66%)  Exposed Structure Granulation Quality: Red Fascia Exposed: No Necrotic Amount: Medium (34-66%) Fat Layer (Subcutaneous Tissue) Exposed: Yes Necrotic  Quality: Adherent Slough Tendon Exposed: No Muscle Exposed: No Joint Exposed: No Bone Exposed: No Electronic Signature(s) Signed: 02/17/2023 12:58:51 PM By: [name] RN Entered By: [name] on 02/16/2023 09:44:19 Harbold, Ilia A ([id number]) [id number].pdf Page 10 of 11 -------------------------------------------------------------------------------- Wound Assessment Details Patient Name: Date of Service: [name], [name] 02/16/2023 8:45 A M Medical Record Number: [id number] Patient Account Number: [id number] Date of Birth/Sex: Treating RN: 22-Sep-1970 (52 y.o. [name]) [name] Primary Care Haylin Camilli: [name] Other Clinician: Referring Lariya Kinzie: Treating Haidan Nhan/Extender: Launa Grill Weeks in Treatment: 0 Wound Status Wound Number: 22 Primary Etiology: Diabetic Wound/Ulcer of the Lower Extremity Wound Location: Left Lower Leg Wound Status: Open Wounding Event: Gradually Appeared Comorbid History: Hypertension, Type II Diabetes, Neuropathy Date Acquired: 08/30/2022 Weeks Of Treatment: 0 Clustered Wound: No Photos Wound Measurements Length: (cm) 11 Width: (cm) 10.5 Depth: (cm) 0.6 Area: (cm) 90.713 Volume: (cm) 54.428 % Reduction in Area: % Reduction in Volume: Epithelialization: None Tunneling: No Undermining: No Wound Description Classification: Grade 2 Exudate Amount: Medium Exudate Type: Serosanguineous Exudate Color: red, brown Foul Odor After Cleansing: No Slough/Fibrino Yes Wound Bed Granulation Amount: Large (67-100%) Exposed Structure Granulation Quality: Red Fascia Exposed: No Necrotic Amount: Small (1-33%) Fat Layer (Subcutaneous Tissue) Exposed: Yes Necrotic Quality: Adherent Slough Tendon Exposed: No Muscle Exposed: No Joint Exposed: No Bone Exposed: No Electronic Signature(s) Signed: 02/17/2023 12:58:51 PM By: [name] RN Entered By: [name] on 02/16/2023 09:46:02 Luecke, Rajveer A  ([id number]) [id number] Page 11 of 11 -------------------------------------------------------------------------------- Vitals Details Patient Name: Date of Service: [name], [name] 02/16/2023 8:45 A M Medical Record Number: [id number] Patient Account Number: [id number] Date of Birth/Sex: Treating RN: May 11, 1971 (52 y.o. [name]) [name] Primary Care Moncia Annas: [name] Other Clinician: Referring Arnold Depinto: Treating Vinisha Faxon/Extender: Launa Grill Weeks in Treatment: 0 Vital Signs Time Taken: 09:16 Temperature (F): 98.3 Height (in): 76 Pulse (bpm): 75 Source: Stated Respiratory Rate (breaths/min): 18 Weight (lbs): 400 Blood Pressure (mmHg): 188/89 Source: Stated Reference Range: 80 - 120 mg / dl Body Mass Index (BMI): 48.7 Electronic Signature(s) Signed: 02/17/2023 12:58:51 PM By: [name] RN Entered By: [name] on 02/16/2023 09:17:13

## 2023-02-17 NOTE — Progress Notes (Signed)
[name], [name] ([id number]) [phone] [id number] Page 1 of 5 Visit Report for 02/16/2023 Abuse Risk Screen Details Patient Name: Date of Service: [name], [name] 02/16/2023 8:45 [name] Medical Record Number: [id number] Patient Account Number: [id number] Date of Birth/Sex: Treating RN: 08-15-1971 (52 y.o. [name]) [name] Primary Care Gerrica Cygan: [name] Other Clinician: Referring Carolann Brazell: Treating Demetruis Depaul/Extender: Launa Grill Weeks in Treatment: 0 Abuse Risk Screen Items Answer ABUSE RISK SCREEN: Has anyone close to you tried to hurt or harm you recentlyo No Do you feel uncomfortable with anyone in your familyo No Has anyone forced you do things that you didnt want to doo No Electronic Signature(s) Signed: 02/17/2023 12:58:51 PM By: [name] RN Entered By: [name] on 02/16/2023 09:18:58 -------------------------------------------------------------------------------- Activities of Daily Living Details Patient Name: Date of Service: [name], [name] 02/16/2023 8:45 [name] Medical Record Number: [id number] Patient Account Number: [id number] Date of Birth/Sex: Treating RN: [birth year] (52 y.o. [name] Primary Care Byron Peacock: [name] Other Clinician: Referring Abeera Flannery: Treating Davionne Mastrangelo/Extender: Launa Grill Weeks in Treatment: 0 Activities of Daily Living Items Answer Activities of Daily Living (Please select one for each item) Drive Automobile Completely Able T Medications ake Completely Able Use T elephone Completely Able Care for Appearance Completely Able Use T oilet Completely Able Bath / Shower Completely Able Dress Self Completely Able Feed Self Completely Able Walk Completely Able Get In / Out Bed Completely Able Housework Completely Able IVER, [name] ([id number]) [phone] [id number] Page 2 of 5 Prepare Meals Completely Able Handle Money  Completely Able Shop for Self Completely Able Electronic Signature(s) Signed: 02/17/2023 12:58:51 PM By: [name] RN Entered By: [name] on 02/16/2023 09:19:25 -------------------------------------------------------------------------------- Education Screening Details Patient Name: Date of Service: [name], [name] 02/16/2023 8:45 [name] Medical Record Number: [id number] Patient Account Number: [id number] Date of Birth/Sex: Treating RN: Dec 08, 1970 (52 y.o. [name] Primary Care Jeiden Daughtridge: [name] Other Clinician: Referring Talmage Teaster: Treating Belmira Daley/Extender: Channing Mutters in Treatment: 0 Primary Learner Assessed: Patient Learning Preferences/Education Level/Primary Language Learning Preference: Explanation Highest Education Level: College or Above Preferred Language: [name] Cognitive Barrier Language Barrier: No Translator Needed: No Memory Deficit: No Emotional Barrier: No Cultural/Religious Beliefs Affecting Medical Care: No Physical Barrier Impaired Vision: No Impaired Hearing: No Decreased Hand dexterity: No Knowledge/Comprehension Knowledge Level: Medium Comprehension Level: High Ability to understand written instructions: High Ability to understand verbal instructions: High Motivation Anxiety Level: Anxious Cooperation: Cooperative Education Importance: Acknowledges Need Interest in Health Problems: Asks Questions Perception: Coherent Willingness to Engage in Self-Management High Activities: Readiness to Engage in Self-Management High Activities: Electronic Signature(s) Signed: 02/17/2023 12:58:51 PM By: [name] RN Entered By: [name] on 02/16/2023 09:21:09 [name], [name] ([id number]) [id number] Page 3 of 5 -------------------------------------------------------------------------------- Fall Risk Assessment Details Patient Name: Date of Service: [name], [name]  02/16/2023 8:45 [name] Medical Record Number: [id number] Patient Account Number: [id number] Date of Birth/Sex: Treating RN: 1970-10-25 (52 y.o. [name]) [name] Primary Care Kourtney Terriquez: [name] Other Clinician: Referring Dory Demont: Treating Jackeline Gutknecht/Extender: Channing Mutters in Treatment: 0 Fall Risk Assessment Items Have you had 2 or more falls in the last 12 monthso 0 No Have you had any fall that resulted in injury in the last 12 monthso 0 No FALLS RISK SCREEN History of falling - immediate or within 3 months 0 No Secondary diagnosis (Do you have 2 or more  medical diagnoseso) 0 No Ambulatory aid None/bed rest/wheelchair/nurse 0 Yes Crutches/cane/walker 0 No Furniture 0 No Intravenous therapy Access/Saline/Heparin Lock 0 No Gait/Transferring Normal/ bed rest/ wheelchair 0 Yes Weak (short steps with or without shuffle, stooped but able to lift head while walking, may seek 0 No support from furniture) Impaired (short steps with shuffle, may have difficulty arising from chair, head down, impaired 0 No balance) Mental Status Oriented to own ability 0 Yes Electronic Signature(s) Signed: 02/17/2023 12:58:51 PM By: [name] RN Entered By: [name] on 02/16/2023 09:21:22 -------------------------------------------------------------------------------- Foot Assessment Details Patient Name: Date of Service: [name], [name] 02/16/2023 8:45 [name] Medical Record Number: [id number] Patient Account Number: [id number] Date of Birth/Sex: Treating RN: May 13, 1971 (52 y.o. [name] Primary Care Inaya Gillham: [name] Other Clinician: Referring Merridith Dershem: Treating Janaisa Birkland/Extender: Launa Grill Weeks in Treatment: 0 Foot Assessment Items Site Locations [name], [name] ([id number]) [phone] [id number] Page 4 of 5 + = Sensation present, - = Sensation absent, C = Callus, U = Ulcer R = Redness, W = Warmth, M =  Maceration, PU = Pre-ulcerative lesion F = Fissure, S = Swelling, D = Dryness Assessment Right: Left: Other Deformity: No No Prior Foot Ulcer: No No Prior Amputation: No No Charcot Joint: No No Ambulatory Status: Ambulatory Without Help Gait: Steady Electronic Signature(s) Signed: 02/17/2023 12:58:51 PM By: [name] RN Entered By: [name] on 02/16/2023 09:26:29 -------------------------------------------------------------------------------- Nutrition Risk Screening Details Patient Name: Date of Service: [name], [name] 02/16/2023 8:45 [name] Medical Record Number: [id number] Patient Account Number: [id number] Date of Birth/Sex: Treating RN: 1971-02-23 (52 y.o. [name]) [name] Primary Care Vaneta Hammontree: [name] Other Clinician: Referring Wilna Pennie: Treating Arnulfo Batson/Extender: Launa Grill Weeks in Treatment: 0 Height (in): 76 Weight (lbs): 400 Body Mass Index (BMI): 48.7 Nutrition Risk Screening Items Score Screening NUTRITION RISK SCREEN: I have an illness or condition that made me change the kind and/or amount of food I eat 2 Yes I eat fewer than two meals per day 0 No I eat few fruits and vegetables, or milk products 0 No I have three or more drinks of beer, liquor or wine almost every day 0 No I have tooth or mouth problems that make it hard for me to eat 0 No I don't always have enough money to buy the food I need 0 No [name], [name] ([id number]) [id number] Page 5 of 5 I eat alone most of the time 0 No I take three or more different prescribed or over-the-counter drugs [name] day 1 Yes Without wanting to, I have lost or gained 10 pounds in the last six months 0 No I am not always physically able to shop, cook and/or feed myself 0 No Nutrition Protocols Good Risk Protocol Moderate Risk Protocol 0 Provide education on nutrition High Risk Proctocol Risk Level: Moderate Risk Score: 3 Electronic  Signature(s) Signed: 02/17/2023 12:58:51 PM By: [name] RN Entered By: [name] on 02/16/2023 09:21:43

## 2023-02-18 ENCOUNTER — Telehealth (INDEPENDENT_AMBULATORY_CARE_PROVIDER_SITE_OTHER): Payer: Self-pay

## 2023-02-18 DIAGNOSIS — I890 Lymphedema, not elsewhere classified: Secondary | ICD-10-CM | POA: Diagnosis not present

## 2023-02-18 DIAGNOSIS — I499 Cardiac arrhythmia, unspecified: Secondary | ICD-10-CM | POA: Diagnosis not present

## 2023-02-18 DIAGNOSIS — J45909 Unspecified asthma, uncomplicated: Secondary | ICD-10-CM | POA: Diagnosis not present

## 2023-02-18 DIAGNOSIS — I872 Venous insufficiency (chronic) (peripheral): Secondary | ICD-10-CM | POA: Diagnosis not present

## 2023-02-18 DIAGNOSIS — E1122 Type 2 diabetes mellitus with diabetic chronic kidney disease: Secondary | ICD-10-CM | POA: Diagnosis not present

## 2023-02-18 DIAGNOSIS — E1165 Type 2 diabetes mellitus with hyperglycemia: Secondary | ICD-10-CM | POA: Diagnosis not present

## 2023-02-18 DIAGNOSIS — E1152 Type 2 diabetes mellitus with diabetic peripheral angiopathy with gangrene: Secondary | ICD-10-CM | POA: Diagnosis not present

## 2023-02-18 DIAGNOSIS — I120 Hypertensive chronic kidney disease with stage 5 chronic kidney disease or end stage renal disease: Secondary | ICD-10-CM | POA: Diagnosis not present

## 2023-02-18 DIAGNOSIS — N186 End stage renal disease: Secondary | ICD-10-CM | POA: Diagnosis not present

## 2023-02-18 NOTE — Telephone Encounter (Signed)
The patient has been educated about the importance of maintaining his dressings as well as his wound vacuum to encourage wound healing as well as to prevent infection from drainage and other issues.  We previously discussed with the patient that removing his wound VAC and can setback his progress.  Ultimately it is his decision

## 2023-02-18 NOTE — Telephone Encounter (Signed)
Lori from [facility] called to inform you that  we took the wound vac off  and he doesn't want to put it back on for the weekend and he doesn't want to put the unna wrap on either. " He wants a break for the weekend per clients request" The right leg was dry tho.

## 2023-02-19 ENCOUNTER — Encounter (INDEPENDENT_AMBULATORY_CARE_PROVIDER_SITE_OTHER): Payer: Self-pay | Admitting: Vascular Surgery

## 2023-02-19 DIAGNOSIS — N186 End stage renal disease: Secondary | ICD-10-CM | POA: Diagnosis not present

## 2023-02-19 DIAGNOSIS — N2581 Secondary hyperparathyroidism of renal origin: Secondary | ICD-10-CM | POA: Diagnosis not present

## 2023-02-19 DIAGNOSIS — Z992 Dependence on renal dialysis: Secondary | ICD-10-CM | POA: Diagnosis not present

## 2023-02-21 ENCOUNTER — Encounter (INDEPENDENT_AMBULATORY_CARE_PROVIDER_SITE_OTHER): Payer: Medicare HMO

## 2023-02-21 DIAGNOSIS — E113599 Type 2 diabetes mellitus with proliferative diabetic retinopathy without macular edema, unspecified eye: Secondary | ICD-10-CM | POA: Diagnosis not present

## 2023-02-21 DIAGNOSIS — Z794 Long term (current) use of insulin: Secondary | ICD-10-CM | POA: Diagnosis not present

## 2023-02-21 DIAGNOSIS — N186 End stage renal disease: Secondary | ICD-10-CM | POA: Diagnosis not present

## 2023-02-21 DIAGNOSIS — E1165 Type 2 diabetes mellitus with hyperglycemia: Secondary | ICD-10-CM | POA: Diagnosis not present

## 2023-02-21 DIAGNOSIS — Z992 Dependence on renal dialysis: Secondary | ICD-10-CM | POA: Diagnosis not present

## 2023-02-21 DIAGNOSIS — I499 Cardiac arrhythmia, unspecified: Secondary | ICD-10-CM | POA: Diagnosis not present

## 2023-02-21 DIAGNOSIS — E1169 Type 2 diabetes mellitus with other specified complication: Secondary | ICD-10-CM | POA: Diagnosis not present

## 2023-02-21 DIAGNOSIS — I890 Lymphedema, not elsewhere classified: Secondary | ICD-10-CM | POA: Diagnosis not present

## 2023-02-21 DIAGNOSIS — J45909 Unspecified asthma, uncomplicated: Secondary | ICD-10-CM | POA: Diagnosis not present

## 2023-02-21 DIAGNOSIS — E11622 Type 2 diabetes mellitus with other skin ulcer: Secondary | ICD-10-CM | POA: Diagnosis not present

## 2023-02-21 DIAGNOSIS — E1152 Type 2 diabetes mellitus with diabetic peripheral angiopathy with gangrene: Secondary | ICD-10-CM | POA: Diagnosis not present

## 2023-02-21 DIAGNOSIS — I872 Venous insufficiency (chronic) (peripheral): Secondary | ICD-10-CM | POA: Diagnosis not present

## 2023-02-21 DIAGNOSIS — D352 Benign neoplasm of pituitary gland: Secondary | ICD-10-CM | POA: Diagnosis not present

## 2023-02-21 DIAGNOSIS — E221 Hyperprolactinemia: Secondary | ICD-10-CM | POA: Diagnosis not present

## 2023-02-21 DIAGNOSIS — E1122 Type 2 diabetes mellitus with diabetic chronic kidney disease: Secondary | ICD-10-CM | POA: Diagnosis not present

## 2023-02-21 DIAGNOSIS — E1159 Type 2 diabetes mellitus with other circulatory complications: Secondary | ICD-10-CM | POA: Diagnosis not present

## 2023-02-21 DIAGNOSIS — I120 Hypertensive chronic kidney disease with stage 5 chronic kidney disease or end stage renal disease: Secondary | ICD-10-CM | POA: Diagnosis not present

## 2023-02-22 DIAGNOSIS — N2581 Secondary hyperparathyroidism of renal origin: Secondary | ICD-10-CM | POA: Diagnosis not present

## 2023-02-22 DIAGNOSIS — N186 End stage renal disease: Secondary | ICD-10-CM | POA: Diagnosis not present

## 2023-02-22 DIAGNOSIS — Z992 Dependence on renal dialysis: Secondary | ICD-10-CM | POA: Diagnosis not present

## 2023-02-22 DIAGNOSIS — Z79899 Other long term (current) drug therapy: Secondary | ICD-10-CM | POA: Diagnosis not present

## 2023-02-23 DIAGNOSIS — E1165 Type 2 diabetes mellitus with hyperglycemia: Secondary | ICD-10-CM | POA: Diagnosis not present

## 2023-02-23 DIAGNOSIS — E1122 Type 2 diabetes mellitus with diabetic chronic kidney disease: Secondary | ICD-10-CM | POA: Diagnosis not present

## 2023-02-23 DIAGNOSIS — I890 Lymphedema, not elsewhere classified: Secondary | ICD-10-CM | POA: Diagnosis not present

## 2023-02-23 DIAGNOSIS — I120 Hypertensive chronic kidney disease with stage 5 chronic kidney disease or end stage renal disease: Secondary | ICD-10-CM | POA: Diagnosis not present

## 2023-02-23 DIAGNOSIS — J45909 Unspecified asthma, uncomplicated: Secondary | ICD-10-CM | POA: Diagnosis not present

## 2023-02-23 DIAGNOSIS — N186 End stage renal disease: Secondary | ICD-10-CM | POA: Diagnosis not present

## 2023-02-23 DIAGNOSIS — E1152 Type 2 diabetes mellitus with diabetic peripheral angiopathy with gangrene: Secondary | ICD-10-CM | POA: Diagnosis not present

## 2023-02-23 DIAGNOSIS — I499 Cardiac arrhythmia, unspecified: Secondary | ICD-10-CM | POA: Diagnosis not present

## 2023-02-23 DIAGNOSIS — I872 Venous insufficiency (chronic) (peripheral): Secondary | ICD-10-CM | POA: Diagnosis not present

## 2023-02-24 DIAGNOSIS — Z992 Dependence on renal dialysis: Secondary | ICD-10-CM | POA: Diagnosis not present

## 2023-02-24 DIAGNOSIS — N2581 Secondary hyperparathyroidism of renal origin: Secondary | ICD-10-CM | POA: Diagnosis not present

## 2023-02-24 DIAGNOSIS — N186 End stage renal disease: Secondary | ICD-10-CM | POA: Diagnosis not present

## 2023-02-25 DIAGNOSIS — E1122 Type 2 diabetes mellitus with diabetic chronic kidney disease: Secondary | ICD-10-CM | POA: Diagnosis not present

## 2023-02-25 DIAGNOSIS — I499 Cardiac arrhythmia, unspecified: Secondary | ICD-10-CM | POA: Diagnosis not present

## 2023-02-25 DIAGNOSIS — E1152 Type 2 diabetes mellitus with diabetic peripheral angiopathy with gangrene: Secondary | ICD-10-CM | POA: Diagnosis not present

## 2023-02-25 DIAGNOSIS — I120 Hypertensive chronic kidney disease with stage 5 chronic kidney disease or end stage renal disease: Secondary | ICD-10-CM | POA: Diagnosis not present

## 2023-02-25 DIAGNOSIS — I872 Venous insufficiency (chronic) (peripheral): Secondary | ICD-10-CM | POA: Diagnosis not present

## 2023-02-25 DIAGNOSIS — I890 Lymphedema, not elsewhere classified: Secondary | ICD-10-CM | POA: Diagnosis not present

## 2023-02-25 DIAGNOSIS — N186 End stage renal disease: Secondary | ICD-10-CM | POA: Diagnosis not present

## 2023-02-25 DIAGNOSIS — J45909 Unspecified asthma, uncomplicated: Secondary | ICD-10-CM | POA: Diagnosis not present

## 2023-02-25 DIAGNOSIS — E1165 Type 2 diabetes mellitus with hyperglycemia: Secondary | ICD-10-CM | POA: Diagnosis not present

## 2023-02-26 DIAGNOSIS — N186 End stage renal disease: Secondary | ICD-10-CM | POA: Diagnosis not present

## 2023-02-26 DIAGNOSIS — Z992 Dependence on renal dialysis: Secondary | ICD-10-CM | POA: Diagnosis not present

## 2023-02-26 DIAGNOSIS — N2581 Secondary hyperparathyroidism of renal origin: Secondary | ICD-10-CM | POA: Diagnosis not present

## 2023-02-27 DIAGNOSIS — Z992 Dependence on renal dialysis: Secondary | ICD-10-CM | POA: Diagnosis not present

## 2023-02-27 DIAGNOSIS — N186 End stage renal disease: Secondary | ICD-10-CM | POA: Diagnosis not present

## 2023-02-28 ENCOUNTER — Telehealth (INDEPENDENT_AMBULATORY_CARE_PROVIDER_SITE_OTHER): Payer: Self-pay

## 2023-02-28 ENCOUNTER — Other Ambulatory Visit (INDEPENDENT_AMBULATORY_CARE_PROVIDER_SITE_OTHER): Payer: Self-pay | Admitting: Nurse Practitioner

## 2023-02-28 ENCOUNTER — Encounter (INDEPENDENT_AMBULATORY_CARE_PROVIDER_SITE_OTHER): Payer: Medicare HMO

## 2023-02-28 DIAGNOSIS — I872 Venous insufficiency (chronic) (peripheral): Secondary | ICD-10-CM | POA: Diagnosis not present

## 2023-02-28 DIAGNOSIS — Z992 Dependence on renal dialysis: Secondary | ICD-10-CM | POA: Diagnosis not present

## 2023-02-28 DIAGNOSIS — I83003 Varicose veins of unspecified lower extremity with ulcer of ankle: Secondary | ICD-10-CM

## 2023-02-28 DIAGNOSIS — N186 End stage renal disease: Secondary | ICD-10-CM | POA: Diagnosis not present

## 2023-02-28 DIAGNOSIS — I890 Lymphedema, not elsewhere classified: Secondary | ICD-10-CM | POA: Diagnosis not present

## 2023-02-28 DIAGNOSIS — N2581 Secondary hyperparathyroidism of renal origin: Secondary | ICD-10-CM | POA: Diagnosis not present

## 2023-02-28 DIAGNOSIS — J45909 Unspecified asthma, uncomplicated: Secondary | ICD-10-CM | POA: Diagnosis not present

## 2023-02-28 DIAGNOSIS — I120 Hypertensive chronic kidney disease with stage 5 chronic kidney disease or end stage renal disease: Secondary | ICD-10-CM | POA: Diagnosis not present

## 2023-02-28 DIAGNOSIS — E1122 Type 2 diabetes mellitus with diabetic chronic kidney disease: Secondary | ICD-10-CM | POA: Diagnosis not present

## 2023-02-28 DIAGNOSIS — E1165 Type 2 diabetes mellitus with hyperglycemia: Secondary | ICD-10-CM | POA: Diagnosis not present

## 2023-02-28 DIAGNOSIS — I499 Cardiac arrhythmia, unspecified: Secondary | ICD-10-CM | POA: Diagnosis not present

## 2023-02-28 DIAGNOSIS — E1152 Type 2 diabetes mellitus with diabetic peripheral angiopathy with gangrene: Secondary | ICD-10-CM | POA: Diagnosis not present

## 2023-02-28 NOTE — Telephone Encounter (Signed)
Patient notified

## 2023-02-28 NOTE — Telephone Encounter (Signed)
Patient called requesting for referral to be sent to [facility] wound care. Patient has spoke with the office and they informed that referral would need to be place to start care. Please Advise

## 2023-02-28 NOTE — Telephone Encounter (Signed)
Referral sent 

## 2023-03-01 DIAGNOSIS — N2581 Secondary hyperparathyroidism of renal origin: Secondary | ICD-10-CM | POA: Diagnosis not present

## 2023-03-01 DIAGNOSIS — N186 End stage renal disease: Secondary | ICD-10-CM | POA: Diagnosis not present

## 2023-03-01 DIAGNOSIS — Z992 Dependence on renal dialysis: Secondary | ICD-10-CM | POA: Diagnosis not present

## 2023-03-02 DIAGNOSIS — I890 Lymphedema, not elsewhere classified: Secondary | ICD-10-CM | POA: Diagnosis not present

## 2023-03-02 DIAGNOSIS — I499 Cardiac arrhythmia, unspecified: Secondary | ICD-10-CM | POA: Diagnosis not present

## 2023-03-02 DIAGNOSIS — J45909 Unspecified asthma, uncomplicated: Secondary | ICD-10-CM | POA: Diagnosis not present

## 2023-03-02 DIAGNOSIS — E1122 Type 2 diabetes mellitus with diabetic chronic kidney disease: Secondary | ICD-10-CM | POA: Diagnosis not present

## 2023-03-02 DIAGNOSIS — I872 Venous insufficiency (chronic) (peripheral): Secondary | ICD-10-CM | POA: Diagnosis not present

## 2023-03-02 DIAGNOSIS — I120 Hypertensive chronic kidney disease with stage 5 chronic kidney disease or end stage renal disease: Secondary | ICD-10-CM | POA: Diagnosis not present

## 2023-03-02 DIAGNOSIS — E1152 Type 2 diabetes mellitus with diabetic peripheral angiopathy with gangrene: Secondary | ICD-10-CM | POA: Diagnosis not present

## 2023-03-02 DIAGNOSIS — N186 End stage renal disease: Secondary | ICD-10-CM | POA: Diagnosis not present

## 2023-03-02 DIAGNOSIS — E1165 Type 2 diabetes mellitus with hyperglycemia: Secondary | ICD-10-CM | POA: Diagnosis not present

## 2023-03-03 DIAGNOSIS — N186 End stage renal disease: Secondary | ICD-10-CM | POA: Diagnosis not present

## 2023-03-03 DIAGNOSIS — N2581 Secondary hyperparathyroidism of renal origin: Secondary | ICD-10-CM | POA: Diagnosis not present

## 2023-03-03 DIAGNOSIS — Z992 Dependence on renal dialysis: Secondary | ICD-10-CM | POA: Diagnosis not present

## 2023-03-04 ENCOUNTER — Ambulatory Visit: Payer: Self-pay | Admitting: *Deleted

## 2023-03-04 DIAGNOSIS — E1165 Type 2 diabetes mellitus with hyperglycemia: Secondary | ICD-10-CM | POA: Diagnosis not present

## 2023-03-04 DIAGNOSIS — I890 Lymphedema, not elsewhere classified: Secondary | ICD-10-CM | POA: Diagnosis not present

## 2023-03-04 DIAGNOSIS — N186 End stage renal disease: Secondary | ICD-10-CM | POA: Diagnosis not present

## 2023-03-04 DIAGNOSIS — I120 Hypertensive chronic kidney disease with stage 5 chronic kidney disease or end stage renal disease: Secondary | ICD-10-CM | POA: Diagnosis not present

## 2023-03-04 DIAGNOSIS — T82848A Pain from vascular prosthetic devices, implants and grafts, initial encounter: Secondary | ICD-10-CM | POA: Diagnosis not present

## 2023-03-04 DIAGNOSIS — T82898A Other specified complication of vascular prosthetic devices, implants and grafts, initial encounter: Secondary | ICD-10-CM | POA: Diagnosis not present

## 2023-03-04 DIAGNOSIS — E1122 Type 2 diabetes mellitus with diabetic chronic kidney disease: Secondary | ICD-10-CM | POA: Diagnosis not present

## 2023-03-04 DIAGNOSIS — J45909 Unspecified asthma, uncomplicated: Secondary | ICD-10-CM | POA: Diagnosis not present

## 2023-03-04 DIAGNOSIS — I499 Cardiac arrhythmia, unspecified: Secondary | ICD-10-CM | POA: Diagnosis not present

## 2023-03-04 DIAGNOSIS — M1030 Gout due to renal impairment, unspecified site: Secondary | ICD-10-CM | POA: Diagnosis not present

## 2023-03-04 DIAGNOSIS — T82858A Stenosis of vascular prosthetic devices, implants and grafts, initial encounter: Secondary | ICD-10-CM | POA: Diagnosis not present

## 2023-03-04 DIAGNOSIS — E1152 Type 2 diabetes mellitus with diabetic peripheral angiopathy with gangrene: Secondary | ICD-10-CM | POA: Diagnosis not present

## 2023-03-04 DIAGNOSIS — I872 Venous insufficiency (chronic) (peripheral): Secondary | ICD-10-CM | POA: Diagnosis not present

## 2023-03-04 DIAGNOSIS — I871 Compression of vein: Secondary | ICD-10-CM | POA: Diagnosis not present

## 2023-03-04 DIAGNOSIS — G4730 Sleep apnea, unspecified: Secondary | ICD-10-CM | POA: Diagnosis not present

## 2023-03-04 NOTE — Patient Outreach (Signed)
  Care Coordination   03/04/2023 Name: [name] MRN: [id number] DOB: 1971-08-12   Care Coordination Outreach Attempts:  An unsuccessful telephone outreach was attempted for a scheduled appointment today.  Follow Up Plan:  No further outreach attempts will be made at this time. We have been unable to contact the patient to offer or enroll patient in care coordination services  Encounter Outcome:  No Answer   Care Coordination Interventions:  No, not indicated    Kemper Durie, RN, MSN, [facility] Care Management Care Management Coordinator [phone]

## 2023-03-05 DIAGNOSIS — Z992 Dependence on renal dialysis: Secondary | ICD-10-CM | POA: Diagnosis not present

## 2023-03-05 DIAGNOSIS — N186 End stage renal disease: Secondary | ICD-10-CM | POA: Diagnosis not present

## 2023-03-05 DIAGNOSIS — N2581 Secondary hyperparathyroidism of renal origin: Secondary | ICD-10-CM | POA: Diagnosis not present

## 2023-03-07 ENCOUNTER — Encounter (INDEPENDENT_AMBULATORY_CARE_PROVIDER_SITE_OTHER): Payer: Medicare HMO

## 2023-03-07 ENCOUNTER — Encounter (INDEPENDENT_AMBULATORY_CARE_PROVIDER_SITE_OTHER): Payer: Self-pay

## 2023-03-07 DIAGNOSIS — E1122 Type 2 diabetes mellitus with diabetic chronic kidney disease: Secondary | ICD-10-CM | POA: Diagnosis not present

## 2023-03-07 DIAGNOSIS — J45909 Unspecified asthma, uncomplicated: Secondary | ICD-10-CM | POA: Diagnosis not present

## 2023-03-07 DIAGNOSIS — I872 Venous insufficiency (chronic) (peripheral): Secondary | ICD-10-CM | POA: Diagnosis not present

## 2023-03-07 DIAGNOSIS — E1165 Type 2 diabetes mellitus with hyperglycemia: Secondary | ICD-10-CM | POA: Diagnosis not present

## 2023-03-07 DIAGNOSIS — I120 Hypertensive chronic kidney disease with stage 5 chronic kidney disease or end stage renal disease: Secondary | ICD-10-CM | POA: Diagnosis not present

## 2023-03-07 DIAGNOSIS — N186 End stage renal disease: Secondary | ICD-10-CM | POA: Diagnosis not present

## 2023-03-07 DIAGNOSIS — I890 Lymphedema, not elsewhere classified: Secondary | ICD-10-CM | POA: Diagnosis not present

## 2023-03-07 DIAGNOSIS — I499 Cardiac arrhythmia, unspecified: Secondary | ICD-10-CM | POA: Diagnosis not present

## 2023-03-07 DIAGNOSIS — E1152 Type 2 diabetes mellitus with diabetic peripheral angiopathy with gangrene: Secondary | ICD-10-CM | POA: Diagnosis not present

## 2023-03-08 DIAGNOSIS — N2581 Secondary hyperparathyroidism of renal origin: Secondary | ICD-10-CM | POA: Diagnosis not present

## 2023-03-08 DIAGNOSIS — Z992 Dependence on renal dialysis: Secondary | ICD-10-CM | POA: Diagnosis not present

## 2023-03-08 DIAGNOSIS — N186 End stage renal disease: Secondary | ICD-10-CM | POA: Diagnosis not present

## 2023-03-09 ENCOUNTER — Encounter: Payer: Medicare HMO | Attending: Internal Medicine | Admitting: Internal Medicine

## 2023-03-09 ENCOUNTER — Telehealth: Payer: Self-pay | Admitting: *Deleted

## 2023-03-09 DIAGNOSIS — E1122 Type 2 diabetes mellitus with diabetic chronic kidney disease: Secondary | ICD-10-CM | POA: Insufficient documentation

## 2023-03-09 DIAGNOSIS — Z992 Dependence on renal dialysis: Secondary | ICD-10-CM | POA: Insufficient documentation

## 2023-03-09 DIAGNOSIS — L97822 Non-pressure chronic ulcer of other part of left lower leg with fat layer exposed: Secondary | ICD-10-CM | POA: Diagnosis not present

## 2023-03-09 DIAGNOSIS — E11621 Type 2 diabetes mellitus with foot ulcer: Secondary | ICD-10-CM | POA: Insufficient documentation

## 2023-03-09 DIAGNOSIS — I87321 Chronic venous hypertension (idiopathic) with inflammation of right lower extremity: Secondary | ICD-10-CM

## 2023-03-09 DIAGNOSIS — Z794 Long term (current) use of insulin: Secondary | ICD-10-CM | POA: Diagnosis not present

## 2023-03-09 DIAGNOSIS — Z79899 Other long term (current) drug therapy: Secondary | ICD-10-CM | POA: Insufficient documentation

## 2023-03-09 DIAGNOSIS — L97522 Non-pressure chronic ulcer of other part of left foot with fat layer exposed: Secondary | ICD-10-CM | POA: Insufficient documentation

## 2023-03-09 DIAGNOSIS — E1140 Type 2 diabetes mellitus with diabetic neuropathy, unspecified: Secondary | ICD-10-CM | POA: Diagnosis not present

## 2023-03-09 DIAGNOSIS — E11622 Type 2 diabetes mellitus with other skin ulcer: Secondary | ICD-10-CM | POA: Insufficient documentation

## 2023-03-09 DIAGNOSIS — Z7984 Long term (current) use of oral hypoglycemic drugs: Secondary | ICD-10-CM | POA: Diagnosis not present

## 2023-03-09 DIAGNOSIS — I120 Hypertensive chronic kidney disease with stage 5 chronic kidney disease or end stage renal disease: Secondary | ICD-10-CM | POA: Insufficient documentation

## 2023-03-09 DIAGNOSIS — I87323 Chronic venous hypertension (idiopathic) with inflammation of bilateral lower extremity: Secondary | ICD-10-CM | POA: Diagnosis not present

## 2023-03-09 DIAGNOSIS — I87312 Chronic venous hypertension (idiopathic) with ulcer of left lower extremity: Secondary | ICD-10-CM

## 2023-03-09 DIAGNOSIS — I890 Lymphedema, not elsewhere classified: Secondary | ICD-10-CM | POA: Diagnosis not present

## 2023-03-09 DIAGNOSIS — N186 End stage renal disease: Secondary | ICD-10-CM | POA: Diagnosis not present

## 2023-03-09 NOTE — Progress Notes (Signed)
  Care Coordination Note  03/09/2023 Name: [name] MRN: [id number] DOB: 31-Aug-1970  [name] is a 52 y.o. year old male who is a primary care patient of [name], [name], MD and is actively engaged with the care management team. I reached out to [employer] by phone today to assist with re-scheduling a follow up visit with the RN Case Manager  Follow up plan: Unsuccessful telephone outreach attempt made. A HIPAA compliant phone message was left for the patient providing contact information and requesting a return call.   Burman Nieves, CCMA Care Coordination Care Guide Direct Dial: [phone]

## 2023-03-10 ENCOUNTER — Telehealth (INDEPENDENT_AMBULATORY_CARE_PROVIDER_SITE_OTHER): Payer: Self-pay | Admitting: Nurse Practitioner

## 2023-03-10 ENCOUNTER — Other Ambulatory Visit (INDEPENDENT_AMBULATORY_CARE_PROVIDER_SITE_OTHER): Payer: Self-pay

## 2023-03-10 DIAGNOSIS — N186 End stage renal disease: Secondary | ICD-10-CM | POA: Diagnosis not present

## 2023-03-10 DIAGNOSIS — N2581 Secondary hyperparathyroidism of renal origin: Secondary | ICD-10-CM | POA: Diagnosis not present

## 2023-03-10 DIAGNOSIS — Z992 Dependence on renal dialysis: Secondary | ICD-10-CM | POA: Diagnosis not present

## 2023-03-10 MED ORDER — ATORVASTATIN CALCIUM 10 MG PO TABS: 10.0000 mg | ORAL_TABLET | Freq: Every day | ORAL | 2 refills | Status: DC

## 2023-03-10 NOTE — Telephone Encounter (Signed)
sent 

## 2023-03-10 NOTE — Telephone Encounter (Signed)
Patient called in requesting a refill on medication    atorvastatin (LIPITOR) 10 MG tablet    [id number] - [place], [place] - [address]

## 2023-03-10 NOTE — Progress Notes (Signed)
[name] ([id number]) [id number] Page 1 of 11 Visit Report for 03/09/2023 Arrival Information Details Patient Name: Date of Service: [name], [name] 03/09/2023 8:00 [name] Medical Record Number: [id number] Patient Account Number: [id number] Date of Birth/Sex: Treating RN: 1971-04-20 (52 y.o. [name]) [name] Primary Care Audry Kauzlarich: [name] Other Clinician: Referring Ahmad Vanwey: Treating [name]/Extender: Geralynn Ochs in Treatment: 3 Visit Information History Since Last Visit Added or deleted any medications: No Patient Arrived: Ambulatory Any new allergies or adverse reactions: No Arrival Time: 08:17 Had [name] fall or experienced change in No Accompanied By: self activities of daily living that may affect Transfer Assistance: None risk of falls: Patient Identification Verified: Yes Signs or symptoms of abuse/neglect since last visito No Secondary Verification Process Completed: Yes Hospitalized since last visit: No Patient Requires Transmission-Based Precautions: No Implantable device outside of the clinic excluding No Patient Has Alerts: Yes cellular tissue based products placed in the center Patient Alerts: Patient on Blood Thinner since last visit: ABI R [place] TBI .61 10/11/22 Has Dressing in Place as Prescribed: Yes ABI L [place] TBI .33 10/11/22 Pain Present Now: No Electronic Signature(s) Signed: 03/10/2023 1:44:50 PM By: [name] Pax RN Entered By: [name] on 03/09/2023 08:18:32 -------------------------------------------------------------------------------- Clinic Level of Care Assessment Details Patient Name: Date of Service: [name], [name] 03/09/2023 8:00 [name] Medical Record Number: [id number] Patient Account Number: [id number] Date of Birth/Sex: Treating RN: 02-Aug-1971 (52 y.o. [name] Primary Care Paloma Grange: [name] Other Clinician: Referring Oline Belk: Treating Terel Bann/Extender: Geralynn Ochs in Treatment: 3 Clinic Level of Care Assessment Items TOOL 4 Quantity Score X- 1 0 Use when only an EandM is performed on FOLLOW-UP visit ASSESSMENTS - Nursing Assessment / Reassessment X- 1 10 Reassessment of Co-morbidities (includes updates in patient status) X- 1 5 Reassessment of Adherence to Treatment Plan [name], [name] ([id number]) [id number] Page 2 of 11 ASSESSMENTS - Wound and Skin [name] ssessment / Reassessment []  - Simple Wound Assessment / Reassessment - one wound 0 X- 3 5 Complex Wound Assessment / Reassessment - multiple wounds []  - 0 Dermatologic / Skin Assessment (not related to wound area) ASSESSMENTS - Focused Assessment []  - 0 Circumferential Edema Measurements - multi extremities []  - 0 Nutritional Assessment / Counseling / Intervention []  - 0 Lower Extremity Assessment (monofilament, tuning fork, pulses) []  - 0 Peripheral Arterial Disease Assessment (using hand held doppler) ASSESSMENTS - Ostomy and/or Continence Assessment and Care []  - 0 Incontinence Assessment and Management []  - 0 Ostomy Care Assessment and Management (repouching, etc.) PROCESS - Coordination of Care X - Simple Patient / Family Education for ongoing care 1 15 []  - 0 Complex (extensive) Patient / Family Education for ongoing care []  - 0 Staff obtains Chiropractor, Records, T Results / Process Orders est []  - 0 Staff telephones HHA, Nursing Homes / Clarify orders / etc []  - 0 Routine Transfer to another Facility (non-emergent condition) []  - 0 Routine Hospital Admission (non-emergent condition) []  - 0 New Admissions / Manufacturing engineer / Ordering NPWT Apligraf, etc. , []  - 0 Emergency Hospital Admission (emergent condition) X- 1 10 Simple Discharge Coordination []  - 0 Complex (extensive) Discharge Coordination PROCESS - Special Needs []  - 0 Pediatric / Minor Patient Management []  - 0 Isolation Patient  Management []  - 0 Hearing / Language / Visual special needs []  - 0 Assessment of Community assistance (transportation, D/C planning, etc.) []  - 0 Additional assistance / Altered  mentation []  - 0 Support Surface(s) Assessment (bed, cushion, seat, etc.) INTERVENTIONS - Wound Cleansing / Measurement []  - 0 Simple Wound Cleansing - one wound X- 2 5 Complex Wound Cleansing - multiple wounds X- 1 5 Wound Imaging (photographs - any number of wounds) []  - 0 Wound Tracing (instead of photographs) []  - 0 Simple Wound Measurement - one wound X- 2 5 Complex Wound Measurement - multiple wounds INTERVENTIONS - Wound Dressings X - Small Wound Dressing one or multiple wounds 2 10 []  - 0 Medium Wound Dressing one or multiple wounds []  - 0 Large Wound Dressing one or multiple wounds []  - 0 Application of Medications - topical []  - 0 Application of Medications - injection INTERVENTIONS - Miscellaneous []  - 0 External ear exam [name], [name] ([id number]) [id number] Page 3 of 11 []  - 0 Specimen Collection (cultures, biopsies, blood, body fluids, etc.) []  - 0 Specimen(s) / Culture(s) sent or taken to Lab for analysis []  - 0 Patient Transfer (multiple staff / Michiel Sites Lift / Similar devices) []  - 0 Simple Staple / Suture removal (25 or less) []  - 0 Complex Staple / Suture removal (26 or more) []  - 0 Hypo / Hyperglycemic Management (close monitor of Blood Glucose) []  - 0 Ankle / Brachial Index (ABI) - do not check if billed separately X- 1 5 Vital Signs Has the patient been seen at the hospital within the last three years: Yes Total Score: 105 Level Of Care: New/Established - Level 3 Electronic Signature(s) Signed: 03/10/2023 1:44:50 PM By: [name] Pax RN Entered By: [name] on 03/09/2023 09:29:37 -------------------------------------------------------------------------------- Encounter Discharge Information Details Patient Name: Date of Service: HO  LT, [name] 03/09/2023 8:00 [name] Medical Record Number: [id number] Patient Account Number: [id number] Date of Birth/Sex: Treating RN: [birth year] (52 y.o. [name] Primary Care Hersel Mcmeen: [name] Other Clinician: Referring Daxen Lanum: Treating Evelina Lore/Extender: Geralynn Ochs in Treatment: 3 Encounter Discharge Information Items Discharge Condition: Stable Ambulatory Status: Ambulatory Discharge Destination: Home Transportation: Private Auto Accompanied By: self Schedule Follow-up Appointment: Yes Clinical Summary of Care: Electronic Signature(s) Signed: 03/09/2023 9:32:09 AM By: [name] Pax RN Entered By: [name] on 03/09/2023 09:32:08 -------------------------------------------------------------------------------- Lower Extremity Assessment Details Patient Name: Date of Service: [name], [name] 03/09/2023 8:00 [name] JOVAUGHN WOJTASZEK, Sulayman [name] ([id number]) [id number] Page 4 of 11 Medical Record Number: [id number] Patient Account Number: [id number] Date of Birth/Sex: Treating RN: 1970-09-23 (52 y.o. [name]) [name] Primary Care Latecia Miler: [name] Other Clinician: Referring Jennavecia Schwier: Treating Nehemiah Montee/Extender: Geralynn Ochs in Treatment: 3 Edema Assessment Assessed: Kyra Searles: No] Franne Forts: No] Edema: [Left: Ye] [Right: s] Calf Left: Right: Point of Measurement: 40 cm From Medial Instep 52 cm Ankle Left: Right: Point of Measurement: 10 cm From Medial Instep 46 cm Vascular Assessment Pulses: Dorsalis Pedis Palpable: [Left:Yes] Electronic Signature(s) Signed: 03/10/2023 1:44:50 PM By: [name] Pax RN Entered By: [name] on 03/09/2023 08:35:37 -------------------------------------------------------------------------------- Multi Wound Chart Details Patient Name: Date of Service: [name], [name] 03/09/2023 8:00 [name] Medical Record Number: [id number] Patient Account Number:  [id number] Date of Birth/Sex: Treating RN: [birth year] (52 y.o. [name] Primary Care Ahren Pettinger: [name] Other Clinician: Referring Shirl Weir: Treating Reagan Klemz/Extender: Geralynn Ochs in Treatment: 3 Vital Signs Height(in): 76 Pulse(bpm): 76 Weight(lbs): 400 Blood Pressure(mmHg): 171/83 Body Mass Index(BMI): 48.7 Temperature(F): 98 Respiratory Rate(breaths/min): 18 [20:Photos:] Left, Lateral Lower Leg Left, Anterior Foot Left Lower Leg Wound Location: Gradually Appeared Gradually  Appeared Gradually Appeared Wounding Event: Diabetic Wound/Ulcer of the Lower Diabetic Wound/Ulcer of the Lower Diabetic Wound/Ulcer of the Lower Primary Etiology: KEEAN, WILMETH [name] ([id number]) [phone].pdf Page 5 of 11 Extremity Extremity Extremity Hypertension, Type II Diabetes, Hypertension, Type II Diabetes, Hypertension, Type II Diabetes, Comorbid History: Neuropathy Neuropathy Neuropathy 12/29/2022 12/29/2022 08/30/2022 Date Acquired: 3 3 3  Weeks of Treatment: Open Open Open Wound Status: No No No Wound Recurrence: 0x0x0 1x1x0.2 11.3x10x0.6 Measurements L x W x D (cm) 0 0.785 88.75 [name] (cm) : rea 0 0.157 53.25 Volume (cm) : 100.00% 81.80% 2.20% % Reduction in [name] rea: 100.00% 87.90% 2.20% % Reduction in Volume: Grade 1 Grade 1 Grade 2 Classification: None Present Medium Medium Exudate [name] mount: N/[name] Serosanguineous Serosanguineous Exudate Type: N/[name] red, brown red, brown Exudate Color: None Present (0%) Small (1-33%) Large (67-100%) Granulation [name] mount: N/[name] Red Red Granulation Quality: None Present (0%) Large (67-100%) Small (1-33%) Necrotic [name] mount: Fascia: No Fat Layer (Subcutaneous Tissue): Yes Fat Layer (Subcutaneous Tissue): Yes Exposed Structures: Fat Layer (Subcutaneous Tissue): No Fascia: No Fascia: No Tendon: No Tendon: No Tendon: No Muscle: No Muscle: No Muscle: No Joint: No Joint: No Joint: No Bone:  No Bone: No Bone: No Large (67-100%) None None Epithelialization: Treatment Notes Electronic Signature(s) Signed: 03/10/2023 1:44:50 PM By: [name] Pax RN Entered By: [name] on 03/09/2023 08:35:44 -------------------------------------------------------------------------------- Multi-Disciplinary Care Plan Details Patient Name: Date of Service: [name], [name] 03/09/2023 8:00 [name] Medical Record Number: [id number] Patient Account Number: [id number] Date of Birth/Sex: Treating RN: 1971/05/26 (52 y.o. [name] Primary Care Ariyanna Oien: [name] Other Clinician: Referring Mccrae Speciale: Treating Samrat Hayward/Extender: Geralynn Ochs in Treatment: 3 Active Inactive Wound/Skin Impairment Nursing Diagnoses: Knowledge deficit related to ulceration/compromised skin integrity Goals: Patient/caregiver will verbalize understanding of skin care regimen Date Initiated: 03/09/2023 Target Resolution Date: 04/09/2023 Goal Status: Active Ulcer/skin breakdown will have [name] volume reduction of 30% by week 4 Date Initiated: 03/09/2023 Target Resolution Date: 04/09/2023 Goal Status: Active Ulcer/skin breakdown will have [name] volume reduction of 50% by week 8 Date Initiated: 03/09/2023 Target Resolution Date: 05/10/2023 Goal Status: Active Ulcer/skin breakdown will have [name] volume reduction of 80% by week 12 Date Initiated: 03/09/2023 Target Resolution Date: 06/09/2023 Goal Status: Active VISHAL, SANDLIN [name] ([id number]) [phone].pdf Page 6 of 11 Ulcer/skin breakdown will heal within 14 weeks Date Initiated: 03/09/2023 Target Resolution Date: 07/10/2023 Goal Status: Active Interventions: Assess patient/caregiver ability to obtain necessary supplies Assess patient/caregiver ability to perform ulcer/skin care regimen upon admission and as needed Assess ulceration(s) every visit Notes: Electronic Signature(s) Signed: 03/10/2023 1:44:50 PM By: [name] Pax RN Entered By: [name] on 03/09/2023 08:38:12 -------------------------------------------------------------------------------- Pain Assessment Details Patient Name: Date of Service: [name], [name] 03/09/2023 8:00 [name] Medical Record Number: [id number] Patient Account Number: [id number] Date of Birth/Sex: Treating RN: 10/10/1970 (52 y.o. [name] Primary Care Lucius Wise: [name] Other Clinician: Referring Almarosa Bohac: Treating Latasia Silberstein/Extender: Geralynn Ochs in Treatment: 3 Active Problems Location of Pain Severity and Description of Pain Patient Has Paino No Site Locations Pain Management and Medication Current Pain Management: Electronic Signature(s) Signed: 03/10/2023 1:44:50 PM By: [name] Pax RN Entered By: [name] on 03/09/2023 08:19:10 Wendell, Dantrell [name] ([id number]) [id number] Page 7 of 11 -------------------------------------------------------------------------------- Patient/Caregiver Education Details Patient Name: Date of Service: [name], [name] 7/10/2024andnbsp8:00 [name] Medical Record Number: [id number] Patient Account Number: [id number] Date of Birth/Gender: Treating RN: 1970/10/17 (52 y.o. M)  [name] Primary Care Physician: [name] Other Clinician: Referring Physician: Treating Physician/Extender: Geralynn Ochs in Treatment: 3 Education Assessment Education Provided To: Patient Education Topics Provided Wound/Skin Impairment: Handouts: Caring for Your Ulcer Methods: Explain/Verbal Responses: State content correctly Electronic Signature(s) Signed: 03/10/2023 1:44:50 PM By: [name] Pax RN Entered By: [name] on 03/09/2023 08:38:48 -------------------------------------------------------------------------------- Wound Assessment Details Patient Name: Date of Service: [name], [name] 03/09/2023 8:00 [name] Medical Record Number:  [id number] Patient Account Number: [id number] Date of Birth/Sex: Treating RN: 22-Sep-1970 (52 y.o. [name] Primary Care Doretta Remmert: [name] Other Clinician: Referring Autum Benfer: Treating Makenize Messman/Extender: Geralynn Ochs in Treatment: 3 Wound Status Wound Number: 20 Primary Etiology: Diabetic Wound/Ulcer of the Lower Extremity Wound Location: Left, Lateral Lower Leg Wound Status: Open Wounding Event: Gradually Appeared Comorbid History: Hypertension, Type II Diabetes, Neuropathy Date Acquired: 12/29/2022 Weeks Of Treatment: 3 Clustered Wound: No Photos Kendall, Deontray [name] ([id number]) [id number] Page 8 of 11 Wound Measurements Length: (cm) Width: (cm) Depth: (cm) Area: (cm) Volume: (cm) 0 % Reduction in Area: 100% 0 % Reduction in Volume: 100% 0 Epithelialization: Large (67-100%) 0 Tunneling: No 0 Undermining: No Wound Description Classification: Grade 1 Exudate Amount: None Present Foul Odor After Cleansing: No Slough/Fibrino No Wound Bed Granulation Amount: None Present (0%) Exposed Structure Necrotic Amount: None Present (0%) Fascia Exposed: No Fat Layer (Subcutaneous Tissue) Exposed: No Tendon Exposed: No Muscle Exposed: No Joint Exposed: No Bone Exposed: No Electronic Signature(s) Signed: 03/10/2023 1:44:50 PM By: [name] Pax RN Entered By: [name] on 03/09/2023 08:31:39 -------------------------------------------------------------------------------- Wound Assessment Details Patient Name: Date of Service: [name], [name] 03/09/2023 8:00 [name] Medical Record Number: [id number] Patient Account Number: [id number] Date of Birth/Sex: Treating RN: 1970/10/26 (52 y.o. [name] Primary Care Coyt Govoni: [name] Other Clinician: Referring Reaghan Kawa: Treating Cannie Muckle/Extender: Geralynn Ochs in Treatment: 3 Wound Status Wound Number: 21 Primary Etiology: Diabetic  Wound/Ulcer of the Lower Extremity Wound Location: Left, Anterior Foot Wound Status: Open Wounding Event: Gradually Appeared Comorbid History: Hypertension, Type II Diabetes, Neuropathy Date Acquired: 12/29/2022 Weeks Of Treatment: 3 Clustered Wound: No Photos Manfredi, Gedalya [name] ([id number]) [id number] Page 9 of 11 Wound Measurements Length: (cm) 1 Width: (cm) 1 Depth: (cm) 0.2 Area: (cm) 0.785 Volume: (cm) 0.157 % Reduction in Area: 81.8% % Reduction in Volume: 87.9% Epithelialization: None Tunneling: No Undermining: No Wound Description Classification: Grade 1 Exudate Amount: Medium Exudate Type: Serosanguineous Exudate Color: red, brown Foul Odor After Cleansing: No Slough/Fibrino Yes Wound Bed Granulation Amount: Small (1-33%) Exposed Structure Granulation Quality: Red Fascia Exposed: No Necrotic Amount: Large (67-100%) Fat Layer (Subcutaneous Tissue) Exposed: Yes Necrotic Quality: Adherent Slough Tendon Exposed: No Muscle Exposed: No Joint Exposed: No Bone Exposed: No Treatment Notes Wound #21 (Foot) Wound Laterality: Left, Anterior Cleanser Soap and Water Discharge Instruction: Gently cleanse wound with antibacterial soap, rinse and pat dry prior to dressing wounds Wound Cleanser Discharge Instruction: Wash your hands with soap and water. Remove old dressing, discard into plastic bag and place into trash. Cleanse the wound with Wound Cleanser prior to applying [name] clean dressing using gauze sponges, not tissues or cotton balls. Do not scrub or use excessive force. Pat dry using gauze sponges, not tissue or cotton balls. Peri-Wound Care Topical Primary Dressing Silvercel Small 2x2 (in/in) Discharge Instruction: Apply [place]cel Small 2x2 (in/in) as instructed Secondary Dressing Kerlix 4.5 x 4.1 (in/yd) Discharge Instruction: from toes to knee Zetuvit Plus 4x4 (  in/in) carboflex Discharge Instruction: over zetuvit layer Secured  With Compression Wrap coban Discharge Instruction: from toes to knee Compression Stockings Add-Ons Electronic Signature(s) Signed: 03/10/2023 1:44:50 PM By: [name] Pax RN Chim, Harlin [name] ([id number]) [phone].pdf Page 10 of 11 Entered By: [name] on 03/09/2023 08:32:08 -------------------------------------------------------------------------------- Wound Assessment Details Patient Name: Date of Service: [name], [name] 03/09/2023 8:00 [name] Medical Record Number: [id number] Patient Account Number: [id number] Date of Birth/Sex: Treating RN: [birth year] (52 y.o. [name]) [name] Primary Care Koven Belinsky: [name] Other Clinician: Referring Dariela Stoker: Treating Lanorris Kalisz/Extender: Geralynn Ochs in Treatment: 3 Wound Status Wound Number: 22 Primary Etiology: Diabetic Wound/Ulcer of the Lower Extremity Wound Location: Left Lower Leg Wound Status: Open Wounding Event: Gradually Appeared Comorbid History: Hypertension, Type II Diabetes, Neuropathy Date Acquired: 08/30/2022 Weeks Of Treatment: 3 Clustered Wound: No Photos Wound Measurements Length: (cm) 11.3 Width: (cm) 10 Depth: (cm) 0.6 Area: (cm) 88.75 Volume: (cm) 53.25 % Reduction in Area: 2.2% % Reduction in Volume: 2.2% Epithelialization: None Tunneling: No Undermining: No Wound Description Classification: Grade 2 Exudate Amount: Medium Exudate Type: Serosanguineous Exudate Color: red, brown Foul Odor After Cleansing: No Slough/Fibrino Yes Wound Bed Granulation Amount: Large (67-100%) Exposed Structure Granulation Quality: Red Fascia Exposed: No Necrotic Amount: Small (1-33%) Fat Layer (Subcutaneous Tissue) Exposed: Yes Necrotic Quality: Adherent Slough Tendon Exposed: No Muscle Exposed: No Joint Exposed: No Bone Exposed: No Treatment Notes Wound #22 (Lower Leg) Wound Laterality: Left Cleanser Shroff, Amair [name] ([id number])  [id number] Page 11 of 11 Soap and Water Discharge Instruction: Gently cleanse wound with antibacterial soap, rinse and pat dry prior to dressing wounds Wound Cleanser Discharge Instruction: Wash your hands with soap and water. Remove old dressing, discard into plastic bag and place into trash. Cleanse the wound with Wound Cleanser prior to applying [name] clean dressing using gauze sponges, not tissues or cotton balls. Do not scrub or use excessive force. Pat dry using gauze sponges, not tissue or cotton balls. Peri-Wound Care Topical Primary Dressing Gauze Discharge Instruction: moistened with Dakins Solution Secondary Dressing Kerlix 4.5 x 4.1 (in/yd) Discharge Instruction: from toes to knee Zetuvit Plus 4x4 (in/in) Secured With Compression Wrap coban Discharge Instruction: from toes to knee Compression Stockings Add-Ons Electronic Signature(s) Signed: 03/10/2023 1:44:50 PM By: [name] Pax RN Entered By: [name] on 03/09/2023 08:32:31 -------------------------------------------------------------------------------- Vitals Details Patient Name: Date of Service: [name], [name] 03/09/2023 8:00 [name] Medical Record Number: [id number] Patient Account Number: [id number] Date of Birth/Sex: Treating RN: 1970-12-21 (52 y.o. [name]) [name] Primary Care Renly Roots: [name] Other Clinician: Referring Ieesha Abbasi: Treating Syliva Mee/Extender: Geralynn Ochs in Treatment: 3 Vital Signs Time Taken: 08:14 Temperature (F): 98 Height (in): 76 Pulse (bpm): 76 Weight (lbs): 400 Respiratory Rate (breaths/min): 18 Body Mass Index (BMI): 48.7 Blood Pressure (mmHg): 171/83 Reference Range: 80 - 120 mg / dl Electronic Signature(s) Signed: 03/10/2023 1:44:50 PM By: [name] Pax RN Entered By: [name] on 03/09/2023 08:18:56

## 2023-03-11 ENCOUNTER — Ambulatory Visit (INDEPENDENT_AMBULATORY_CARE_PROVIDER_SITE_OTHER): Payer: Medicare HMO | Admitting: Nurse Practitioner

## 2023-03-11 DIAGNOSIS — E1122 Type 2 diabetes mellitus with diabetic chronic kidney disease: Secondary | ICD-10-CM | POA: Diagnosis not present

## 2023-03-11 DIAGNOSIS — N186 End stage renal disease: Secondary | ICD-10-CM | POA: Diagnosis not present

## 2023-03-11 DIAGNOSIS — E1152 Type 2 diabetes mellitus with diabetic peripheral angiopathy with gangrene: Secondary | ICD-10-CM | POA: Diagnosis not present

## 2023-03-11 DIAGNOSIS — I120 Hypertensive chronic kidney disease with stage 5 chronic kidney disease or end stage renal disease: Secondary | ICD-10-CM | POA: Diagnosis not present

## 2023-03-11 DIAGNOSIS — I872 Venous insufficiency (chronic) (peripheral): Secondary | ICD-10-CM | POA: Diagnosis not present

## 2023-03-11 DIAGNOSIS — I890 Lymphedema, not elsewhere classified: Secondary | ICD-10-CM | POA: Diagnosis not present

## 2023-03-11 DIAGNOSIS — E1165 Type 2 diabetes mellitus with hyperglycemia: Secondary | ICD-10-CM | POA: Diagnosis not present

## 2023-03-11 DIAGNOSIS — I499 Cardiac arrhythmia, unspecified: Secondary | ICD-10-CM | POA: Diagnosis not present

## 2023-03-11 DIAGNOSIS — J45909 Unspecified asthma, uncomplicated: Secondary | ICD-10-CM | POA: Diagnosis not present

## 2023-03-12 DIAGNOSIS — N2581 Secondary hyperparathyroidism of renal origin: Secondary | ICD-10-CM | POA: Diagnosis not present

## 2023-03-12 DIAGNOSIS — N186 End stage renal disease: Secondary | ICD-10-CM | POA: Diagnosis not present

## 2023-03-12 DIAGNOSIS — Z992 Dependence on renal dialysis: Secondary | ICD-10-CM | POA: Diagnosis not present

## 2023-03-14 DIAGNOSIS — E1152 Type 2 diabetes mellitus with diabetic peripheral angiopathy with gangrene: Secondary | ICD-10-CM | POA: Diagnosis not present

## 2023-03-14 DIAGNOSIS — I120 Hypertensive chronic kidney disease with stage 5 chronic kidney disease or end stage renal disease: Secondary | ICD-10-CM | POA: Diagnosis not present

## 2023-03-14 DIAGNOSIS — I872 Venous insufficiency (chronic) (peripheral): Secondary | ICD-10-CM | POA: Diagnosis not present

## 2023-03-14 DIAGNOSIS — E1151 Type 2 diabetes mellitus with diabetic peripheral angiopathy without gangrene: Secondary | ICD-10-CM | POA: Insufficient documentation

## 2023-03-14 DIAGNOSIS — J45909 Unspecified asthma, uncomplicated: Secondary | ICD-10-CM | POA: Diagnosis not present

## 2023-03-14 DIAGNOSIS — I499 Cardiac arrhythmia, unspecified: Secondary | ICD-10-CM | POA: Diagnosis not present

## 2023-03-14 DIAGNOSIS — D631 Anemia in chronic kidney disease: Secondary | ICD-10-CM | POA: Insufficient documentation

## 2023-03-14 DIAGNOSIS — N186 End stage renal disease: Secondary | ICD-10-CM | POA: Diagnosis not present

## 2023-03-14 DIAGNOSIS — G4700 Insomnia, unspecified: Secondary | ICD-10-CM | POA: Insufficient documentation

## 2023-03-14 DIAGNOSIS — E1122 Type 2 diabetes mellitus with diabetic chronic kidney disease: Secondary | ICD-10-CM | POA: Diagnosis not present

## 2023-03-14 DIAGNOSIS — D8481 Immunodeficiency due to conditions classified elsewhere: Secondary | ICD-10-CM | POA: Insufficient documentation

## 2023-03-14 DIAGNOSIS — E1165 Type 2 diabetes mellitus with hyperglycemia: Secondary | ICD-10-CM | POA: Diagnosis not present

## 2023-03-14 DIAGNOSIS — I890 Lymphedema, not elsewhere classified: Secondary | ICD-10-CM | POA: Diagnosis not present

## 2023-03-14 DIAGNOSIS — E1121 Type 2 diabetes mellitus with diabetic nephropathy: Secondary | ICD-10-CM | POA: Insufficient documentation

## 2023-03-14 DIAGNOSIS — E785 Hyperlipidemia, unspecified: Secondary | ICD-10-CM | POA: Diagnosis present

## 2023-03-14 DIAGNOSIS — E441 Mild protein-calorie malnutrition: Secondary | ICD-10-CM | POA: Insufficient documentation

## 2023-03-14 DIAGNOSIS — N2581 Secondary hyperparathyroidism of renal origin: Secondary | ICD-10-CM | POA: Insufficient documentation

## 2023-03-14 DIAGNOSIS — D352 Benign neoplasm of pituitary gland: Secondary | ICD-10-CM | POA: Insufficient documentation

## 2023-03-15 DIAGNOSIS — N2581 Secondary hyperparathyroidism of renal origin: Secondary | ICD-10-CM | POA: Diagnosis not present

## 2023-03-15 DIAGNOSIS — Z992 Dependence on renal dialysis: Secondary | ICD-10-CM | POA: Diagnosis not present

## 2023-03-15 DIAGNOSIS — N186 End stage renal disease: Secondary | ICD-10-CM | POA: Diagnosis not present

## 2023-03-16 ENCOUNTER — Encounter (HOSPITAL_BASED_OUTPATIENT_CLINIC_OR_DEPARTMENT_OTHER): Payer: Medicare HMO | Admitting: Internal Medicine

## 2023-03-16 DIAGNOSIS — N186 End stage renal disease: Secondary | ICD-10-CM | POA: Diagnosis not present

## 2023-03-16 DIAGNOSIS — L97822 Non-pressure chronic ulcer of other part of left lower leg with fat layer exposed: Secondary | ICD-10-CM | POA: Diagnosis not present

## 2023-03-16 DIAGNOSIS — E11622 Type 2 diabetes mellitus with other skin ulcer: Secondary | ICD-10-CM | POA: Diagnosis not present

## 2023-03-16 DIAGNOSIS — L97522 Non-pressure chronic ulcer of other part of left foot with fat layer exposed: Secondary | ICD-10-CM

## 2023-03-16 DIAGNOSIS — I120 Hypertensive chronic kidney disease with stage 5 chronic kidney disease or end stage renal disease: Secondary | ICD-10-CM | POA: Diagnosis not present

## 2023-03-16 DIAGNOSIS — I87323 Chronic venous hypertension (idiopathic) with inflammation of bilateral lower extremity: Secondary | ICD-10-CM | POA: Diagnosis not present

## 2023-03-16 DIAGNOSIS — E11621 Type 2 diabetes mellitus with foot ulcer: Secondary | ICD-10-CM | POA: Diagnosis not present

## 2023-03-16 DIAGNOSIS — I87312 Chronic venous hypertension (idiopathic) with ulcer of left lower extremity: Secondary | ICD-10-CM | POA: Diagnosis not present

## 2023-03-16 DIAGNOSIS — E1122 Type 2 diabetes mellitus with diabetic chronic kidney disease: Secondary | ICD-10-CM | POA: Diagnosis not present

## 2023-03-16 DIAGNOSIS — I890 Lymphedema, not elsewhere classified: Secondary | ICD-10-CM | POA: Diagnosis not present

## 2023-03-17 DIAGNOSIS — Z992 Dependence on renal dialysis: Secondary | ICD-10-CM | POA: Diagnosis not present

## 2023-03-17 DIAGNOSIS — N2581 Secondary hyperparathyroidism of renal origin: Secondary | ICD-10-CM | POA: Diagnosis not present

## 2023-03-17 DIAGNOSIS — N186 End stage renal disease: Secondary | ICD-10-CM | POA: Diagnosis not present

## 2023-03-17 NOTE — Progress Notes (Signed)
[name], [name] ([id number]) [id number] Page 1 of 10 Visit Report for 03/16/2023 Arrival Information Details Patient Name: Date of Service: [name], [name] [place] [name] 03/16/2023 8:00 [name] Medical Record Number: [id number] Patient Account Number: [id number] Date of Birth/Sex: Treating RN: 1971/01/11 (52 y.o. [name]) [name] Primary Care [name]: [name] Other Clinician: Referring Jcion Buddenhagen: Treating Winferd Wease/Extender: Geralynn Ochs in Treatment: 4 Visit Information History Since Last Visit Added or deleted any medications: No Patient Arrived: Ambulatory Any new allergies or adverse reactions: No Arrival Time: 08:06 Had [name] fall or experienced change in No Accompanied By: self activities of daily living that may affect Transfer Assistance: None risk of falls: Patient Identification Verified: Yes Signs or symptoms of abuse/neglect since last visito No Secondary Verification Process Completed: Yes Hospitalized since last visit: No Patient Requires Transmission-Based Precautions: No Implantable device outside of the clinic excluding No Patient Has Alerts: Yes cellular tissue based products placed in the center Patient Alerts: Patient on Blood Thinner since last visit: ABI R [place] TBI .61 10/11/22 Has Dressing in Place as Prescribed: Yes ABI L [place] TBI .33 10/11/22 Has Compression in Place as Prescribed: Yes Pain Present Now: No Electronic Signature(s) Signed: 03/17/2023 3:42:23 PM By: [name] RN Entered By: [name] on 03/16/2023 08:06:38 -------------------------------------------------------------------------------- Clinic Level of Care Assessment Details Patient Name: Date of Service: [name], [name] 03/16/2023 8:00 [name] Medical Record Number: [id number] Patient Account Number: [id number] Date of Birth/Sex: Treating RN: [birth year] (52 y.o. [name] Primary Care Mattisyn Cardona: [name] Other Clinician: Referring  Antrone Walla: Treating Mael Delap/Extender: Geralynn Ochs in Treatment: 4 Clinic Level of Care Assessment Items TOOL 1 Quantity Score []  - 0 Use when EandM and Procedure is performed on INITIAL visit ASSESSMENTS - Nursing Assessment / Reassessment []  - 0 General Physical Exam (combine w/ comprehensive assessment (listed just below) when performed on new pt. evals) []  - 0 Comprehensive Assessment (HX, ROS, Risk Assessments, Wounds Hx, etc.) [name], [name] ([id number]) [id number] Page 2 of 10 ASSESSMENTS - Wound and Skin Assessment / Reassessment []  - 0 Dermatologic / Skin Assessment (not related to wound area) ASSESSMENTS - Ostomy and/or Continence Assessment and Care []  - 0 Incontinence Assessment and Management []  - 0 Ostomy Care Assessment and Management (repouching, etc.) PROCESS - Coordination of Care []  - 0 Simple Patient / Family Education for ongoing care []  - 0 Complex (extensive) Patient / Family Education for ongoing care []  - 0 Staff obtains Chiropractor, Records, T Results / Process Orders est []  - 0 Staff telephones HHA, Nursing Homes / Clarify orders / etc []  - 0 Routine Transfer to another Facility (non-emergent condition) []  - 0 Routine Hospital Admission (non-emergent condition) []  - 0 New Admissions / Manufacturing engineer / Ordering NPWT Apligraf, etc. , []  - 0 Emergency Hospital Admission (emergent condition) PROCESS - Special Needs []  - 0 Pediatric / Minor Patient Management []  - 0 Isolation Patient Management []  - 0 Hearing / Language / Visual special needs []  - 0 Assessment of Community assistance (transportation, D/C planning, etc.) []  - 0 Additional assistance / Altered mentation []  - 0 Support Surface(s) Assessment (bed, cushion, seat, etc.) INTERVENTIONS - Miscellaneous []  - 0 External ear exam []  - 0 Patient Transfer (multiple staff / Nurse, adult / Similar devices) []  - 0 Simple Staple  / Suture removal (25 or less) []  - 0 Complex Staple / Suture removal (26 or more) []  - 0 Hypo/Hyperglycemic Management (do not  check if billed separately) []  - 0 Ankle / Brachial Index (ABI) - do not check if billed separately Has the patient been seen at the hospital within the last three years: Yes Total Score: 0 Level Of Care: ____ Electronic Signature(s) Signed: 03/17/2023 3:42:23 PM By: [name] RN Entered By: [name] on 03/16/2023 08:47:42 -------------------------------------------------------------------------------- Encounter Discharge Information Details Patient Name: Date of Service: [name], [name] 03/16/2023 8:00 [name] Medical Record Number: [id number] Patient Account Number: [id number] Date of Birth/Sex: Treating RN: [birth year] (52 y.o. [name] Primary Care Rikita Grabert: [name] Other Clinician: Referring Khalid Lacko: Treating Ziquan Fidel/Extender: Joselyn Arrow [name] ([id number]) [phone].pdf Page 3 of 10 Weeks in Treatment: 4 Encounter Discharge Information Items Post Procedure Vitals Discharge Condition: Stable Temperature (F): 98.3 Ambulatory Status: Ambulatory Pulse (bpm): 66 Discharge Destination: Home Respiratory Rate (breaths/min): 18 Transportation: Private Auto Blood Pressure (mmHg): 189/77 Accompanied By: self Schedule Follow-up Appointment: Yes Clinical Summary of Care: Electronic Signature(s) Signed: 03/17/2023 3:42:23 PM By: [name] RN Entered By: [name] on 03/16/2023 08:50:08 -------------------------------------------------------------------------------- Lower Extremity Assessment Details Patient Name: Date of Service: [name], [name] 03/16/2023 8:00 [name] Medical Record Number: [id number] Patient Account Number: [id number] Date of Birth/Sex: Treating RN: 20-Nov-1970 (52 y.o. [name] Primary Care Jaziel Bennett: [name] Other Clinician: Referring  Shakeel Disney: Treating Jaeger Trueheart/Extender: Geralynn Ochs in Treatment: 4 Edema Assessment Assessed: Kyra Searles: No] Franne Forts: No] Edema: [Left: Ye] [Right: s] Calf Left: Right: Point of Measurement: 40 cm From Medial Instep 52 cm Ankle Left: Right: Point of Measurement: 10 cm From Medial Instep 42 cm Vascular Assessment Pulses: Dorsalis Pedis Palpable: [Left:Yes] Electronic Signature(s) Signed: 03/17/2023 3:42:23 PM By: [name] RN Entered By: [name] on 03/16/2023 08:19:27 [name], [name] ([id number]) [id number] Page 4 of 10 -------------------------------------------------------------------------------- Multi Wound Chart Details Patient Name: Date of Service: [name], [name] 03/16/2023 8:00 [name] Medical Record Number: [id number] Patient Account Number: [id number] Date of Birth/Sex: Treating RN: 1971-08-28 (52 y.o. [name]) [name] Primary Care Letrice Pollok: [name] Other Clinician: Referring Kourtland Coopman: Treating Casmere Hollenbeck/Extender: Geralynn Ochs in Treatment: 4 Vital Signs Height(in): 76 Pulse(bpm): 66 Weight(lbs): 400 Blood Pressure(mmHg): 189/77 Body Mass Index(BMI): 48.7 Temperature(F): 98.3 Respiratory Rate(breaths/min): 18 [21:Photos:] [N/[name]:N/[name]] Left, Anterior Foot Left Lower Leg N/[name] Wound Location: Gradually Appeared Gradually Appeared N/[name] Wounding Event: Diabetic Wound/Ulcer of the Lower Diabetic Wound/Ulcer of the Lower N/[name] Primary Etiology: Extremity Extremity Hypertension, Type II Diabetes, Hypertension, Type II Diabetes, N/[name] Comorbid History: Neuropathy Neuropathy 12/29/2022 08/30/2022 N/[name] Date Acquired: 4 4 N/[name] Weeks of Treatment: Open Open N/[name] Wound Status: No No N/[name] Wound Recurrence: 0.7x0.7x0.1 11x9x0.6 N/[name] Measurements L x W x D (cm) 0.385 77.754 N/[name] (cm) : rea 0.038 46.653 N/[name] Volume (cm) : 91.10% 14.30% N/[name] % Reduction in [name] rea: 97.10% 14.30% N/[name] % Reduction  in Volume: Grade 1 Grade 2 N/[name] Classification: Medium Medium N/[name] Exudate [name] mount: Serosanguineous Serosanguineous N/[name] Exudate Type: red, brown red, brown N/[name] Exudate Color: Small (1-33%) Large (67-100%) N/[name] Granulation [name] mount: Red Red N/[name] Granulation Quality: Large (67-100%) Small (1-33%) N/[name] Necrotic [name] mount: Fat Layer (Subcutaneous Tissue): Yes Fat Layer (Subcutaneous Tissue): Yes N/[name] Exposed Structures: Fascia: No Fascia: No Tendon: No Tendon: No Muscle: No Muscle: No Joint: No Joint: No Bone: No Bone: No None None N/[name] Epithelialization: Treatment Notes Electronic Signature(s) Signed: 03/17/2023 3:42:23 PM By: [name] RN Entered By: [name] on 03/16/2023 08:19:32  [name], [name] ([id number]) [id number] Page 5 of 10 -------------------------------------------------------------------------------- Multi-Disciplinary Care Plan Details Patient Name: Date of Service: [name], [name] 03/16/2023 8:00 [name] Medical Record Number: [id number] Patient Account Number: [id number] Date of Birth/Sex: Treating RN: [birth year] (52 y.o. [name]) [name] Primary Care Edker Punt: [name] Other Clinician: Referring Latha Staunton: Treating Hermina Barnard/Extender: Geralynn Ochs in Treatment: 4 Active Inactive Wound/Skin Impairment Nursing Diagnoses: Knowledge deficit related to ulceration/compromised skin integrity Goals: Patient/caregiver will verbalize understanding of skin care regimen Date Initiated: 03/09/2023 Target Resolution Date: 04/09/2023 Goal Status: Active Ulcer/skin breakdown will have [name] volume reduction of 30% by week 4 Date Initiated: 03/09/2023 Target Resolution Date: 04/09/2023 Goal Status: Active Ulcer/skin breakdown will have [name] volume reduction of 50% by week 8 Date Initiated: 03/09/2023 Target Resolution Date: 05/10/2023 Goal Status: Active Ulcer/skin breakdown will have [name] volume reduction of 80% by week  12 Date Initiated: 03/09/2023 Target Resolution Date: 06/09/2023 Goal Status: Active Ulcer/skin breakdown will heal within 14 weeks Date Initiated: 03/09/2023 Target Resolution Date: 07/10/2023 Goal Status: Active Interventions: Assess patient/caregiver ability to obtain necessary supplies Assess patient/caregiver ability to perform ulcer/skin care regimen upon admission and as needed Assess ulceration(s) every visit Notes: Electronic Signature(s) Signed: 03/17/2023 3:42:23 PM By: [name] RN Entered By: [name] on 03/16/2023 08:19:49 -------------------------------------------------------------------------------- Pain Assessment Details Patient Name: Date of Service: [name], [name] 03/16/2023 8:00 [name] Medical Record Number: [id number] Patient Account Number: [id number] [name], [name] ([id number]) [id number] Page 6 of 10 Date of Birth/Sex: Treating RN: 10/07/1970 (52 y.o. [name]) [name] Primary Care Isay Perleberg: Other Clinician: Maudie [name] Referring Erianna Jolly: Treating Lakendrick Paradis/Extender: Geralynn Ochs in Treatment: 4 Active Problems Location of Pain Severity and Description of Pain Patient Has Paino No Site Locations Pain Management and Medication Current Pain Management: Electronic Signature(s) Signed: 03/17/2023 3:42:23 PM By: [name] RN Entered By: [name] on 03/16/2023 08:07:05 -------------------------------------------------------------------------------- Patient/Caregiver Education Details Patient Name: Date of Service: [name], [name] 7/17/2024andnbsp8:00 [name] Medical Record Number: [id number] Patient Account Number: [id number] Date of Birth/Gender: Treating RN: Nov 19, 1970 (52 y.o. [name] Primary Care Physician: [name] Other Clinician: Referring Physician: Treating Physician/Extender: Geralynn Ochs in Treatment: 4 Education  Assessment Education Provided To: Patient Education Topics Provided Wound/Skin Impairment: Handouts: Caring for Your Ulcer Methods: Explain/Verbal Responses: State content correctly Electronic Signature(s) [name], [name] ([id number]) [id number] Page 7 of 10 Signed: 03/17/2023 3:42:23 PM By: [name] RN Entered By: [name] on 03/16/2023 08:20:01 -------------------------------------------------------------------------------- Wound Assessment Details Patient Name: Date of Service: [name], [name] 03/16/2023 8:00 [name] Medical Record Number: [id number] Patient Account Number: [id number] Date of Birth/Sex: Treating RN: [birth year] (52 y.o. [name]) [name] Primary Care Yanis Larin: [name] Other Clinician: Referring Vernadette Stutsman: Treating Megon Kalina/Extender: Geralynn Ochs in Treatment: 4 Wound Status Wound Number: 21 Primary Etiology: Diabetic Wound/Ulcer of the Lower Extremity Wound Location: Left, Anterior Foot Wound Status: Open Wounding Event: Gradually Appeared Comorbid History: Hypertension, Type II Diabetes, Neuropathy Date Acquired: 12/29/2022 Weeks Of Treatment: 4 Clustered Wound: No Photos Wound Measurements Length: (cm) 0.7 Width: (cm) 0.7 Depth: (cm) 0.1 Area: (cm) 0.385 Volume: (cm) 0.038 % Reduction in Area: 91.1% % Reduction in Volume: 97.1% Epithelialization: None Tunneling: No Undermining: No Wound Description Classification: Grade 1 Exudate Amount: Medium Exudate Type: Serosanguineous Exudate Color: red, brown Foul Odor After Cleansing: No Slough/Fibrino Yes Wound Bed Granulation Amount: Small (1-33%) Exposed Structure  Granulation Quality: Red Fascia Exposed: No Necrotic Amount: Large (67-100%) Fat Layer (Subcutaneous Tissue) Exposed: Yes Necrotic Quality: Adherent Slough Tendon Exposed: No Muscle Exposed: No Joint Exposed: No Bone Exposed: No Treatment Notes Wound #21 (Foot) Wound  Laterality: Left, Anterior Eline, Kaeo [name] ([id number]) [id number] Page 8 of 10 Cleanser Soap and Water Discharge Instruction: Gently cleanse wound with antibacterial soap, rinse and pat dry prior to dressing wounds Wound Cleanser Discharge Instruction: Wash your hands with soap and water. Remove old dressing, discard into plastic bag and place into trash. Cleanse the wound with Wound Cleanser prior to applying [name] clean dressing using gauze sponges, not tissues or cotton balls. Do not scrub or use excessive force. Pat dry using gauze sponges, not tissue or cotton balls. Peri-Wound Care Topical Primary Dressing Silvercel Small 2x2 (in/in) Discharge Instruction: Apply Silvercel Small 2x2 (in/in) as instructed Secondary Dressing Kerlix 4.5 x 4.1 (in/yd) Discharge Instruction: from toes to knee Zetuvit Plus 4x4 (in/in) carboflex Discharge Instruction: over zetuvit layer Secured With Compression Wrap coban Discharge Instruction: from toes to knee Compression Stockings Add-Ons Electronic Signature(s) Signed: 03/17/2023 3:42:23 PM By: [name] RN Entered By: [name] on 03/16/2023 08:17:59 -------------------------------------------------------------------------------- Wound Assessment Details Patient Name: Date of Service: [name], [name] 03/16/2023 8:00 [name] Medical Record Number: [id number] Patient Account Number: [id number] Date of Birth/Sex: Treating RN: 1971-05-29 (52 y.o. [name] Primary Care Kashia Brossard: [name] Other Clinician: Referring Kali Deadwyler: Treating Hyatt Capobianco/Extender: Geralynn Ochs in Treatment: 4 Wound Status Wound Number: 22 Primary Etiology: Diabetic Wound/Ulcer of the Lower Extremity Wound Location: Left Lower Leg Wound Status: Open Wounding Event: Gradually Appeared Comorbid History: Hypertension, Type II Diabetes, Neuropathy Date Acquired: 08/30/2022 Weeks Of Treatment: 4 Clustered  Wound: No Photos [name], [name] ([id number]) [id number] Page 9 of 10 Wound Measurements Length: (cm) 11 Width: (cm) 9 Depth: (cm) 0.6 Area: (cm) 77.754 Volume: (cm) 46.653 % Reduction in Area: 14.3% % Reduction in Volume: 14.3% Epithelialization: None Tunneling: No Undermining: No Wound Description Classification: Grade 2 Exudate Amount: Medium Exudate Type: Serosanguineous Exudate Color: red, brown Foul Odor After Cleansing: No Slough/Fibrino Yes Wound Bed Granulation Amount: Large (67-100%) Exposed Structure Granulation Quality: Red Fascia Exposed: No Necrotic Amount: Small (1-33%) Fat Layer (Subcutaneous Tissue) Exposed: Yes Necrotic Quality: Adherent Slough Tendon Exposed: No Muscle Exposed: No Joint Exposed: No Bone Exposed: No Treatment Notes Wound #22 (Lower Leg) Wound Laterality: Left Cleanser Soap and Water Discharge Instruction: Gently cleanse wound with antibacterial soap, rinse and pat dry prior to dressing wounds Wound Cleanser Discharge Instruction: Wash your hands with soap and water. Remove old dressing, discard into plastic bag and place into trash. Cleanse the wound with Wound Cleanser prior to applying [name] clean dressing using gauze sponges, not tissues or cotton balls. Do not scrub or use excessive force. Pat dry using gauze sponges, not tissue or cotton balls. Peri-Wound Care Topical Primary Dressing Gauze Discharge Instruction: moistened with Dakins Solution Secondary Dressing Kerlix 4.5 x 4.1 (in/yd) Discharge Instruction: from toes to knee Zetuvit Plus 4x4 (in/in) Secured With Compression Wrap coban Discharge Instruction: from toes to knee Compression Stockings Add-Ons Electronic Signature(s) Signed: 03/17/2023 3:42:23 PM By: [name] RN Entered By: [name] on 03/16/2023 08:18:20 [name], [name] ([id number]) [id number] Page 10 of  10 -------------------------------------------------------------------------------- Vitals Details Patient Name: Date of Service: [name], [name] 03/16/2023 8:00 [name] Medical Record Number: [id number] Patient Account Number: [id number] Date of Birth/Sex: Treating RN: Apr 04, 1971 (52 y.o. M)  [name] Primary Care Alejos Reinhardt: [name] Other Clinician: Referring Amadeus Oyama: Treating Keerstin Bjelland/Extender: Geralynn Ochs in Treatment: 4 Vital Signs Time Taken: 08:06 Temperature (F): 98.3 Height (in): 76 Pulse (bpm): 66 Weight (lbs): 400 Respiratory Rate (breaths/min): 18 Body Mass Index (BMI): 48.7 Blood Pressure (mmHg): 189/77 Reference Range: 80 - 120 mg / dl Electronic Signature(s) Signed: 03/17/2023 3:42:23 PM By: [name] RN Entered By: [name] on 03/16/2023 08:06:59

## 2023-03-18 DIAGNOSIS — E1122 Type 2 diabetes mellitus with diabetic chronic kidney disease: Secondary | ICD-10-CM | POA: Diagnosis not present

## 2023-03-18 DIAGNOSIS — J45909 Unspecified asthma, uncomplicated: Secondary | ICD-10-CM | POA: Diagnosis not present

## 2023-03-18 DIAGNOSIS — N186 End stage renal disease: Secondary | ICD-10-CM | POA: Diagnosis not present

## 2023-03-18 DIAGNOSIS — I872 Venous insufficiency (chronic) (peripheral): Secondary | ICD-10-CM | POA: Diagnosis not present

## 2023-03-18 DIAGNOSIS — E1152 Type 2 diabetes mellitus with diabetic peripheral angiopathy with gangrene: Secondary | ICD-10-CM | POA: Diagnosis not present

## 2023-03-18 DIAGNOSIS — E1165 Type 2 diabetes mellitus with hyperglycemia: Secondary | ICD-10-CM | POA: Diagnosis not present

## 2023-03-18 DIAGNOSIS — I499 Cardiac arrhythmia, unspecified: Secondary | ICD-10-CM | POA: Diagnosis not present

## 2023-03-18 DIAGNOSIS — I120 Hypertensive chronic kidney disease with stage 5 chronic kidney disease or end stage renal disease: Secondary | ICD-10-CM | POA: Diagnosis not present

## 2023-03-18 DIAGNOSIS — I890 Lymphedema, not elsewhere classified: Secondary | ICD-10-CM | POA: Diagnosis not present

## 2023-03-19 DIAGNOSIS — N2581 Secondary hyperparathyroidism of renal origin: Secondary | ICD-10-CM | POA: Diagnosis not present

## 2023-03-19 DIAGNOSIS — N186 End stage renal disease: Secondary | ICD-10-CM | POA: Diagnosis not present

## 2023-03-19 DIAGNOSIS — Z992 Dependence on renal dialysis: Secondary | ICD-10-CM | POA: Diagnosis not present

## 2023-03-21 DIAGNOSIS — I120 Hypertensive chronic kidney disease with stage 5 chronic kidney disease or end stage renal disease: Secondary | ICD-10-CM | POA: Diagnosis not present

## 2023-03-21 DIAGNOSIS — I890 Lymphedema, not elsewhere classified: Secondary | ICD-10-CM | POA: Diagnosis not present

## 2023-03-21 DIAGNOSIS — I499 Cardiac arrhythmia, unspecified: Secondary | ICD-10-CM | POA: Diagnosis not present

## 2023-03-21 DIAGNOSIS — N2581 Secondary hyperparathyroidism of renal origin: Secondary | ICD-10-CM | POA: Diagnosis not present

## 2023-03-21 DIAGNOSIS — E1152 Type 2 diabetes mellitus with diabetic peripheral angiopathy with gangrene: Secondary | ICD-10-CM | POA: Diagnosis not present

## 2023-03-21 DIAGNOSIS — E1165 Type 2 diabetes mellitus with hyperglycemia: Secondary | ICD-10-CM | POA: Diagnosis not present

## 2023-03-21 DIAGNOSIS — N186 End stage renal disease: Secondary | ICD-10-CM | POA: Diagnosis not present

## 2023-03-21 DIAGNOSIS — I872 Venous insufficiency (chronic) (peripheral): Secondary | ICD-10-CM | POA: Diagnosis not present

## 2023-03-21 DIAGNOSIS — Z992 Dependence on renal dialysis: Secondary | ICD-10-CM | POA: Diagnosis not present

## 2023-03-21 DIAGNOSIS — J45909 Unspecified asthma, uncomplicated: Secondary | ICD-10-CM | POA: Diagnosis not present

## 2023-03-21 DIAGNOSIS — E1122 Type 2 diabetes mellitus with diabetic chronic kidney disease: Secondary | ICD-10-CM | POA: Diagnosis not present

## 2023-03-22 DIAGNOSIS — Z992 Dependence on renal dialysis: Secondary | ICD-10-CM | POA: Diagnosis not present

## 2023-03-22 DIAGNOSIS — N186 End stage renal disease: Secondary | ICD-10-CM | POA: Diagnosis not present

## 2023-03-22 DIAGNOSIS — N2581 Secondary hyperparathyroidism of renal origin: Secondary | ICD-10-CM | POA: Diagnosis not present

## 2023-03-23 ENCOUNTER — Encounter (HOSPITAL_BASED_OUTPATIENT_CLINIC_OR_DEPARTMENT_OTHER): Payer: Medicare HMO | Admitting: Internal Medicine

## 2023-03-23 DIAGNOSIS — Z794 Long term (current) use of insulin: Secondary | ICD-10-CM | POA: Diagnosis not present

## 2023-03-23 DIAGNOSIS — E11622 Type 2 diabetes mellitus with other skin ulcer: Secondary | ICD-10-CM | POA: Diagnosis not present

## 2023-03-23 DIAGNOSIS — D696 Thrombocytopenia, unspecified: Secondary | ICD-10-CM | POA: Diagnosis present

## 2023-03-23 DIAGNOSIS — Z7985 Long-term (current) use of injectable non-insulin antidiabetic drugs: Secondary | ICD-10-CM | POA: Diagnosis not present

## 2023-03-23 DIAGNOSIS — I87312 Chronic venous hypertension (idiopathic) with ulcer of left lower extremity: Secondary | ICD-10-CM | POA: Diagnosis not present

## 2023-03-23 DIAGNOSIS — N2581 Secondary hyperparathyroidism of renal origin: Secondary | ICD-10-CM | POA: Diagnosis present

## 2023-03-23 DIAGNOSIS — R509 Fever, unspecified: Secondary | ICD-10-CM | POA: Diagnosis not present

## 2023-03-23 DIAGNOSIS — G4730 Sleep apnea, unspecified: Secondary | ICD-10-CM | POA: Diagnosis present

## 2023-03-23 DIAGNOSIS — E1051 Type 1 diabetes mellitus with diabetic peripheral angiopathy without gangrene: Secondary | ICD-10-CM | POA: Diagnosis present

## 2023-03-23 DIAGNOSIS — I10 Essential (primary) hypertension: Secondary | ICD-10-CM | POA: Diagnosis not present

## 2023-03-23 DIAGNOSIS — E559 Vitamin D deficiency, unspecified: Secondary | ICD-10-CM | POA: Diagnosis present

## 2023-03-23 DIAGNOSIS — A419 Sepsis, unspecified organism: Secondary | ICD-10-CM | POA: Diagnosis not present

## 2023-03-23 DIAGNOSIS — Z79899 Other long term (current) drug therapy: Secondary | ICD-10-CM | POA: Diagnosis not present

## 2023-03-23 DIAGNOSIS — E1065 Type 1 diabetes mellitus with hyperglycemia: Secondary | ICD-10-CM | POA: Diagnosis present

## 2023-03-23 DIAGNOSIS — E119 Type 2 diabetes mellitus without complications: Secondary | ICD-10-CM | POA: Diagnosis not present

## 2023-03-23 DIAGNOSIS — E7800 Pure hypercholesterolemia, unspecified: Secondary | ICD-10-CM | POA: Diagnosis present

## 2023-03-23 DIAGNOSIS — Z1152 Encounter for screening for COVID-19: Secondary | ICD-10-CM | POA: Diagnosis not present

## 2023-03-23 DIAGNOSIS — L97329 Non-pressure chronic ulcer of left ankle with unspecified severity: Secondary | ICD-10-CM | POA: Diagnosis not present

## 2023-03-23 DIAGNOSIS — R112 Nausea with vomiting, unspecified: Secondary | ICD-10-CM | POA: Diagnosis present

## 2023-03-23 DIAGNOSIS — I1311 Hypertensive heart and chronic kidney disease without heart failure, with stage 5 chronic kidney disease, or end stage renal disease: Secondary | ICD-10-CM | POA: Diagnosis present

## 2023-03-23 DIAGNOSIS — L97909 Non-pressure chronic ulcer of unspecified part of unspecified lower leg with unspecified severity: Secondary | ICD-10-CM | POA: Diagnosis not present

## 2023-03-23 DIAGNOSIS — D631 Anemia in chronic kidney disease: Secondary | ICD-10-CM | POA: Diagnosis present

## 2023-03-23 DIAGNOSIS — R1111 Vomiting without nausea: Secondary | ICD-10-CM | POA: Diagnosis not present

## 2023-03-23 DIAGNOSIS — E1022 Type 1 diabetes mellitus with diabetic chronic kidney disease: Secondary | ICD-10-CM | POA: Diagnosis present

## 2023-03-23 DIAGNOSIS — I83023 Varicose veins of left lower extremity with ulcer of ankle: Secondary | ICD-10-CM | POA: Diagnosis not present

## 2023-03-23 DIAGNOSIS — R5383 Other fatigue: Secondary | ICD-10-CM | POA: Diagnosis not present

## 2023-03-23 DIAGNOSIS — R0602 Shortness of breath: Secondary | ICD-10-CM | POA: Diagnosis not present

## 2023-03-23 DIAGNOSIS — M109 Gout, unspecified: Secondary | ICD-10-CM | POA: Diagnosis present

## 2023-03-23 DIAGNOSIS — Z7901 Long term (current) use of anticoagulants: Secondary | ICD-10-CM | POA: Diagnosis not present

## 2023-03-23 DIAGNOSIS — N186 End stage renal disease: Secondary | ICD-10-CM | POA: Diagnosis present

## 2023-03-23 DIAGNOSIS — S91302A Unspecified open wound, left foot, initial encounter: Secondary | ICD-10-CM | POA: Diagnosis not present

## 2023-03-23 DIAGNOSIS — L97822 Non-pressure chronic ulcer of other part of left lower leg with fat layer exposed: Secondary | ICD-10-CM | POA: Diagnosis present

## 2023-03-23 DIAGNOSIS — M7989 Other specified soft tissue disorders: Secondary | ICD-10-CM | POA: Diagnosis not present

## 2023-03-23 DIAGNOSIS — Z6841 Body Mass Index (BMI) 40.0 and over, adult: Secondary | ICD-10-CM | POA: Diagnosis not present

## 2023-03-23 DIAGNOSIS — I890 Lymphedema, not elsewhere classified: Secondary | ICD-10-CM | POA: Diagnosis present

## 2023-03-23 DIAGNOSIS — E10319 Type 1 diabetes mellitus with unspecified diabetic retinopathy without macular edema: Secondary | ICD-10-CM | POA: Diagnosis present

## 2023-03-23 DIAGNOSIS — R7881 Bacteremia: Secondary | ICD-10-CM | POA: Diagnosis not present

## 2023-03-23 DIAGNOSIS — R0989 Other specified symptoms and signs involving the circulatory and respiratory systems: Secondary | ICD-10-CM | POA: Diagnosis not present

## 2023-03-23 DIAGNOSIS — B955 Unspecified streptococcus as the cause of diseases classified elsewhere: Secondary | ICD-10-CM | POA: Diagnosis not present

## 2023-03-23 DIAGNOSIS — I959 Hypotension, unspecified: Secondary | ICD-10-CM | POA: Diagnosis not present

## 2023-03-23 DIAGNOSIS — E1122 Type 2 diabetes mellitus with diabetic chronic kidney disease: Secondary | ICD-10-CM | POA: Diagnosis not present

## 2023-03-23 DIAGNOSIS — I517 Cardiomegaly: Secondary | ICD-10-CM | POA: Diagnosis not present

## 2023-03-23 DIAGNOSIS — I340 Nonrheumatic mitral (valve) insufficiency: Secondary | ICD-10-CM | POA: Diagnosis not present

## 2023-03-23 DIAGNOSIS — J449 Chronic obstructive pulmonary disease, unspecified: Secondary | ICD-10-CM | POA: Diagnosis present

## 2023-03-23 DIAGNOSIS — A408 Other streptococcal sepsis: Secondary | ICD-10-CM | POA: Diagnosis present

## 2023-03-23 DIAGNOSIS — Z0389 Encounter for observation for other suspected diseases and conditions ruled out: Secondary | ICD-10-CM | POA: Diagnosis not present

## 2023-03-23 DIAGNOSIS — L97522 Non-pressure chronic ulcer of other part of left foot with fat layer exposed: Secondary | ICD-10-CM | POA: Diagnosis present

## 2023-03-23 DIAGNOSIS — Z992 Dependence on renal dialysis: Secondary | ICD-10-CM | POA: Diagnosis not present

## 2023-03-24 ENCOUNTER — Encounter: Payer: Self-pay | Admitting: Internal Medicine

## 2023-03-24 ENCOUNTER — Inpatient Hospital Stay
Admission: EM | Admit: 2023-03-24 | Discharge: 2023-03-29 | DRG: 871 | Disposition: A | Discharge status: Home Health | Payer: Medicare HMO | Source: Ambulatory Visit | Attending: Internal Medicine | Admitting: Internal Medicine

## 2023-03-24 ENCOUNTER — Emergency Department: Payer: Medicare HMO

## 2023-03-24 ENCOUNTER — Other Ambulatory Visit: Payer: Self-pay

## 2023-03-24 DIAGNOSIS — G4730 Sleep apnea, unspecified: Secondary | ICD-10-CM | POA: Diagnosis present

## 2023-03-24 DIAGNOSIS — L97329 Non-pressure chronic ulcer of left ankle with unspecified severity: Secondary | ICD-10-CM

## 2023-03-24 DIAGNOSIS — Z794 Long term (current) use of insulin: Secondary | ICD-10-CM | POA: Diagnosis not present

## 2023-03-24 DIAGNOSIS — E11622 Type 2 diabetes mellitus with other skin ulcer: Secondary | ICD-10-CM | POA: Diagnosis not present

## 2023-03-24 DIAGNOSIS — I38 Endocarditis, valve unspecified: Secondary | ICD-10-CM

## 2023-03-24 DIAGNOSIS — I10 Essential (primary) hypertension: Secondary | ICD-10-CM | POA: Diagnosis not present

## 2023-03-24 DIAGNOSIS — L97822 Non-pressure chronic ulcer of other part of left lower leg with fat layer exposed: Secondary | ICD-10-CM | POA: Diagnosis present

## 2023-03-24 DIAGNOSIS — I1311 Hypertensive heart and chronic kidney disease without heart failure, with stage 5 chronic kidney disease, or end stage renal disease: Secondary | ICD-10-CM | POA: Diagnosis present

## 2023-03-24 DIAGNOSIS — D696 Thrombocytopenia, unspecified: Secondary | ICD-10-CM | POA: Diagnosis present

## 2023-03-24 DIAGNOSIS — E1065 Type 1 diabetes mellitus with hyperglycemia: Secondary | ICD-10-CM | POA: Diagnosis present

## 2023-03-24 DIAGNOSIS — M109 Gout, unspecified: Secondary | ICD-10-CM | POA: Diagnosis present

## 2023-03-24 DIAGNOSIS — Z7901 Long term (current) use of anticoagulants: Secondary | ICD-10-CM | POA: Diagnosis not present

## 2023-03-24 DIAGNOSIS — I890 Lymphedema, not elsewhere classified: Secondary | ICD-10-CM | POA: Diagnosis present

## 2023-03-24 DIAGNOSIS — E559 Vitamin D deficiency, unspecified: Secondary | ICD-10-CM | POA: Diagnosis present

## 2023-03-24 DIAGNOSIS — Z87891 Personal history of nicotine dependence: Secondary | ICD-10-CM

## 2023-03-24 DIAGNOSIS — L97909 Non-pressure chronic ulcer of unspecified part of unspecified lower leg with unspecified severity: Secondary | ICD-10-CM

## 2023-03-24 DIAGNOSIS — Z1152 Encounter for screening for COVID-19: Secondary | ICD-10-CM | POA: Diagnosis not present

## 2023-03-24 DIAGNOSIS — N2581 Secondary hyperparathyroidism of renal origin: Secondary | ICD-10-CM | POA: Diagnosis present

## 2023-03-24 DIAGNOSIS — B955 Unspecified streptococcus as the cause of diseases classified elsewhere: Secondary | ICD-10-CM | POA: Diagnosis not present

## 2023-03-24 DIAGNOSIS — D631 Anemia in chronic kidney disease: Secondary | ICD-10-CM | POA: Diagnosis present

## 2023-03-24 DIAGNOSIS — A419 Sepsis, unspecified organism: Secondary | ICD-10-CM | POA: Diagnosis not present

## 2023-03-24 DIAGNOSIS — E1122 Type 2 diabetes mellitus with diabetic chronic kidney disease: Secondary | ICD-10-CM | POA: Diagnosis not present

## 2023-03-24 DIAGNOSIS — I340 Nonrheumatic mitral (valve) insufficiency: Secondary | ICD-10-CM | POA: Diagnosis not present

## 2023-03-24 DIAGNOSIS — J449 Chronic obstructive pulmonary disease, unspecified: Secondary | ICD-10-CM | POA: Diagnosis present

## 2023-03-24 DIAGNOSIS — Z992 Dependence on renal dialysis: Secondary | ICD-10-CM

## 2023-03-24 DIAGNOSIS — E7800 Pure hypercholesterolemia, unspecified: Secondary | ICD-10-CM | POA: Diagnosis present

## 2023-03-24 DIAGNOSIS — L97522 Non-pressure chronic ulcer of other part of left foot with fat layer exposed: Secondary | ICD-10-CM | POA: Diagnosis present

## 2023-03-24 DIAGNOSIS — I83023 Varicose veins of left lower extremity with ulcer of ankle: Secondary | ICD-10-CM

## 2023-03-24 DIAGNOSIS — Z833 Family history of diabetes mellitus: Secondary | ICD-10-CM

## 2023-03-24 DIAGNOSIS — E109 Type 1 diabetes mellitus without complications: Secondary | ICD-10-CM | POA: Diagnosis present

## 2023-03-24 DIAGNOSIS — R112 Nausea with vomiting, unspecified: Secondary | ICD-10-CM | POA: Diagnosis present

## 2023-03-24 DIAGNOSIS — E10319 Type 1 diabetes mellitus with unspecified diabetic retinopathy without macular edema: Secondary | ICD-10-CM | POA: Diagnosis present

## 2023-03-24 DIAGNOSIS — Z888 Allergy status to other drugs, medicaments and biological substances status: Secondary | ICD-10-CM

## 2023-03-24 DIAGNOSIS — Z841 Family history of disorders of kidney and ureter: Secondary | ICD-10-CM

## 2023-03-24 DIAGNOSIS — Z7902 Long term (current) use of antithrombotics/antiplatelets: Secondary | ICD-10-CM

## 2023-03-24 DIAGNOSIS — R7881 Bacteremia: Secondary | ICD-10-CM | POA: Diagnosis not present

## 2023-03-24 DIAGNOSIS — Z91040 Latex allergy status: Secondary | ICD-10-CM

## 2023-03-24 DIAGNOSIS — E1169 Type 2 diabetes mellitus with other specified complication: Secondary | ICD-10-CM | POA: Diagnosis present

## 2023-03-24 DIAGNOSIS — E1051 Type 1 diabetes mellitus with diabetic peripheral angiopathy without gangrene: Secondary | ICD-10-CM | POA: Diagnosis present

## 2023-03-24 DIAGNOSIS — N186 End stage renal disease: Secondary | ICD-10-CM | POA: Diagnosis present

## 2023-03-24 DIAGNOSIS — Z79899 Other long term (current) drug therapy: Secondary | ICD-10-CM | POA: Diagnosis not present

## 2023-03-24 DIAGNOSIS — M7989 Other specified soft tissue disorders: Secondary | ICD-10-CM | POA: Diagnosis not present

## 2023-03-24 DIAGNOSIS — Z6841 Body Mass Index (BMI) 40.0 and over, adult: Secondary | ICD-10-CM | POA: Diagnosis not present

## 2023-03-24 DIAGNOSIS — I517 Cardiomegaly: Secondary | ICD-10-CM | POA: Diagnosis not present

## 2023-03-24 DIAGNOSIS — I739 Peripheral vascular disease, unspecified: Secondary | ICD-10-CM | POA: Diagnosis present

## 2023-03-24 DIAGNOSIS — Z0389 Encounter for observation for other suspected diseases and conditions ruled out: Secondary | ICD-10-CM | POA: Diagnosis not present

## 2023-03-24 DIAGNOSIS — E785 Hyperlipidemia, unspecified: Secondary | ICD-10-CM | POA: Diagnosis present

## 2023-03-24 DIAGNOSIS — S91302A Unspecified open wound, left foot, initial encounter: Secondary | ICD-10-CM | POA: Diagnosis not present

## 2023-03-24 DIAGNOSIS — E66813 Obesity, class 3: Secondary | ICD-10-CM | POA: Diagnosis present

## 2023-03-24 DIAGNOSIS — E1022 Type 1 diabetes mellitus with diabetic chronic kidney disease: Secondary | ICD-10-CM | POA: Diagnosis present

## 2023-03-24 DIAGNOSIS — A408 Other streptococcal sepsis: Secondary | ICD-10-CM | POA: Diagnosis present

## 2023-03-24 DIAGNOSIS — S81802A Unspecified open wound, left lower leg, initial encounter: Secondary | ICD-10-CM | POA: Diagnosis present

## 2023-03-24 DIAGNOSIS — E119 Type 2 diabetes mellitus without complications: Secondary | ICD-10-CM | POA: Diagnosis not present

## 2023-03-24 DIAGNOSIS — R0989 Other specified symptoms and signs involving the circulatory and respiratory systems: Secondary | ICD-10-CM | POA: Diagnosis not present

## 2023-03-24 LAB — COMPREHENSIVE METABOLIC PANEL
ALT: 15 U/L (ref 0–44)
AST: 18 U/L (ref 15–41)
Albumin: 3.9 g/dL (ref 3.5–5.0)
Alkaline Phosphatase: 56 U/L (ref 38–126)
Anion gap: 11 (ref 5–15)
BUN: 43 mg/dL — ABNORMAL HIGH (ref 6–20)
CO2: 27 mmol/L (ref 22–32)
Calcium: 9.1 mg/dL (ref 8.9–10.3)
Chloride: 97 mmol/L — ABNORMAL LOW (ref 98–111)
Creatinine, Ser: 7.85 mg/dL — ABNORMAL HIGH (ref 0.61–1.24)
GFR, Estimated: 8 mL/min — ABNORMAL LOW (ref >60)
Glucose, Bld: 137 mg/dL — ABNORMAL HIGH (ref 70–99)
Potassium: 3.6 mmol/L (ref 3.5–5.1)
Sodium: 135 mmol/L (ref 135–145)
Total Bilirubin: 0.9 mg/dL (ref 0.3–1.2)
Total Protein: 9.1 g/dL — ABNORMAL HIGH (ref 6.5–8.1)

## 2023-03-24 LAB — BLOOD CULTURE ID PANEL (REFLEXED) - BCID2

## 2023-03-24 LAB — CULTURE, BLOOD (ROUTINE X 2): Special Requests: ADEQUATE

## 2023-03-24 LAB — CBC WITH DIFFERENTIAL/PLATELET
Abs Immature Granulocytes: 0.04 10*3/uL (ref 0.00–0.07)
Basophils Absolute: 0 10*3/uL (ref 0.0–0.1)
Basophils Relative: 0 %
Eosinophils Absolute: 0.1 10*3/uL (ref 0.0–0.5)
Eosinophils Relative: 1 %
HCT: 39.4 % (ref 39.0–52.0)
Hemoglobin: 12.4 g/dL — ABNORMAL LOW (ref 13.0–17.0)
Immature Granulocytes: 0 %
Lymphocytes Relative: 3 %
Lymphs Abs: 0.3 10*3/uL — ABNORMAL LOW (ref 0.7–4.0)
MCH: 29.5 pg (ref 26.0–34.0)
MCHC: 31.5 g/dL (ref 30.0–36.0)
MCV: 93.6 fL (ref 80.0–100.0)
Monocytes Absolute: 0.2 10*3/uL (ref 0.1–1.0)
Monocytes Relative: 2 %
Neutro Abs: 9.6 10*3/uL — ABNORMAL HIGH (ref 1.7–7.7)
Neutrophils Relative %: 94 %
Platelets: 163 10*3/uL (ref 150–400)
RBC: 4.21 MIL/uL — ABNORMAL LOW (ref 4.22–5.81)
RDW: 19 % — ABNORMAL HIGH (ref 11.5–15.5)
WBC: 10.2 10*3/uL (ref 4.0–10.5)
nRBC: 0 % (ref 0.0–0.2)

## 2023-03-24 LAB — LACTIC ACID, PLASMA
Lactic Acid, Venous: 1.9 mmol/L (ref 0.5–1.9)
Lactic Acid, Venous: 1.5 mmol/L (ref 0.5–1.9)

## 2023-03-24 LAB — GLUCOSE, CAPILLARY
Glucose-Capillary: 134 mg/dL — ABNORMAL HIGH (ref 70–99)
Glucose-Capillary: 176 mg/dL — ABNORMAL HIGH (ref 70–99)
Glucose-Capillary: 140 mg/dL — ABNORMAL HIGH (ref 70–99)

## 2023-03-24 LAB — PROTIME-INR
INR: 1 (ref 0.8–1.2)
Prothrombin Time: 13.8 seconds (ref 11.4–15.2)

## 2023-03-24 LAB — APTT: aPTT: 33 seconds (ref 24–36)

## 2023-03-24 LAB — RESP PANEL BY RT-PCR (RSV, FLU A&B, COVID)  RVPGX2
Influenza A by PCR: NEGATIVE
Influenza B by PCR: NEGATIVE
Resp Syncytial Virus by PCR: NEGATIVE
SARS Coronavirus 2 by RT PCR: NEGATIVE

## 2023-03-24 LAB — PROCALCITONIN: Procalcitonin: 1.13 ng/mL

## 2023-03-24 LAB — C-REACTIVE PROTEIN: CRP: 0.5 mg/dL (ref <1.0)

## 2023-03-24 LAB — SEDIMENTATION RATE: Sed Rate: 43 mm/hr — ABNORMAL HIGH (ref 0–20)

## 2023-03-24 MED ORDER — RENA-VITE PO TABS
1.0000 | ORAL_TABLET | ORAL | Status: DC
  Administered 2023-03-25 – 2023-03-28 (×2): 1 via ORAL
  Filled 2023-03-24 (×3): qty 1

## 2023-03-24 MED ORDER — CALCIUM ACETATE (PHOS BINDER) 667 MG PO CAPS
2001.0000 mg | ORAL_CAPSULE | Freq: Three times a day (TID) | ORAL | Status: DC
  Administered 2023-03-24 – 2023-03-28 (×10): 2001 mg via ORAL
  Filled 2023-03-24 (×12): qty 3

## 2023-03-24 MED ORDER — VANCOMYCIN HCL IN DEXTROSE 1-5 GM/200ML-% IV SOLN
1000.0000 mg | Freq: Once | INTRAVENOUS | Status: AC
  Administered 2023-03-24: 1000 mg via INTRAVENOUS
  Filled 2023-03-24: qty 200

## 2023-03-24 MED ORDER — LACTATED RINGERS IV SOLN: INTRAVENOUS | Status: DC

## 2023-03-24 MED ORDER — INSULIN GLARGINE-YFGN 100 UNIT/ML ~~LOC~~ SOLN
15.0000 [IU] | Freq: Every day | SUBCUTANEOUS | Status: DC
  Administered 2023-03-24 – 2023-03-27 (×4): 15 [IU] via SUBCUTANEOUS
  Filled 2023-03-24 (×7): qty 0.15

## 2023-03-24 MED ORDER — ATORVASTATIN CALCIUM 20 MG PO TABS
80.0000 mg | ORAL_TABLET | Freq: Every day | ORAL | Status: DC
  Administered 2023-03-24 – 2023-03-28 (×5): 80 mg via ORAL
  Filled 2023-03-24 (×5): qty 4

## 2023-03-24 MED ORDER — APIXABAN 5 MG PO TABS
5.0000 mg | ORAL_TABLET | Freq: Two times a day (BID) | ORAL | Status: DC
  Administered 2023-03-24 – 2023-03-29 (×10): 5 mg via ORAL
  Filled 2023-03-24 (×10): qty 1

## 2023-03-24 MED ORDER — SODIUM CHLORIDE 0.9 % IV SOLN: INTRAVENOUS | Status: DC

## 2023-03-24 MED ORDER — ACETAMINOPHEN 325 MG PO TABS
650.0000 mg | ORAL_TABLET | Freq: Four times a day (QID) | ORAL | Status: DC | PRN
  Administered 2023-03-24: 650 mg via ORAL
  Filled 2023-03-24: qty 2

## 2023-03-24 MED ORDER — DM-GUAIFENESIN ER 30-600 MG PO TB12: 1.0000 | ORAL_TABLET | Freq: Two times a day (BID) | ORAL | Status: DC | PRN

## 2023-03-24 MED ORDER — TRAZODONE HCL 50 MG PO TABS
50.0000 mg | ORAL_TABLET | Freq: Every day | ORAL | Status: DC
  Administered 2023-03-24 – 2023-03-28 (×5): 50 mg via ORAL
  Filled 2023-03-24 (×5): qty 1

## 2023-03-24 MED ORDER — CALCIUM ACETATE (PHOS BINDER) 667 MG PO CAPS: 1334.0000 mg | ORAL_CAPSULE | ORAL | Status: DC | PRN

## 2023-03-24 MED ORDER — VANCOMYCIN HCL 1500 MG/300ML IV SOLN
1500.0000 mg | Freq: Once | INTRAVENOUS | Status: AC
  Administered 2023-03-24: 1500 mg via INTRAVENOUS
  Filled 2023-03-24: qty 300

## 2023-03-24 MED ORDER — ALLOPURINOL 100 MG PO TABS
100.0000 mg | ORAL_TABLET | Freq: Every day | ORAL | Status: DC
  Administered 2023-03-24 – 2023-03-29 (×6): 100 mg via ORAL
  Filled 2023-03-24 (×6): qty 1

## 2023-03-24 MED ORDER — SODIUM CHLORIDE 0.9 % IV SOLN
2.0000 g | Freq: Once | INTRAVENOUS | Status: AC
  Administered 2023-03-24: 2 g via INTRAVENOUS
  Filled 2023-03-24: qty 12.5

## 2023-03-24 MED ORDER — CINACALCET HCL 30 MG PO TABS
30.0000 mg | ORAL_TABLET | Freq: Every day | ORAL | Status: DC
  Administered 2023-03-25 – 2023-03-29 (×5): 30 mg via ORAL
  Filled 2023-03-24 (×5): qty 1

## 2023-03-24 MED ORDER — CLOPIDOGREL BISULFATE 75 MG PO TABS
75.0000 mg | ORAL_TABLET | Freq: Every day | ORAL | Status: DC
  Administered 2023-03-24 – 2023-03-29 (×6): 75 mg via ORAL
  Filled 2023-03-24 (×6): qty 1

## 2023-03-24 MED ORDER — INSULIN ASPART 100 UNIT/ML IJ SOLN
0.0000 [IU] | Freq: Three times a day (TID) | INTRAMUSCULAR | Status: DC
  Administered 2023-03-24: 1 [IU] via SUBCUTANEOUS
  Administered 2023-03-24: 2 [IU] via SUBCUTANEOUS
  Administered 2023-03-25 – 2023-03-26 (×3): 1 [IU] via SUBCUTANEOUS
  Administered 2023-03-26 – 2023-03-27 (×2): 2 [IU] via SUBCUTANEOUS
  Administered 2023-03-27 – 2023-03-28 (×2): 1 [IU] via SUBCUTANEOUS
  Filled 2023-03-24 (×11): qty 1

## 2023-03-24 MED ORDER — INSULIN ASPART 100 UNIT/ML IJ SOLN: 0.0000 [IU] | Freq: Every day | INTRAMUSCULAR | Status: DC

## 2023-03-24 MED ORDER — ONDANSETRON HCL 4 MG/2ML IJ SOLN: 4.0000 mg | Freq: Three times a day (TID) | INTRAMUSCULAR | Status: DC | PRN

## 2023-03-24 MED ORDER — SODIUM CHLORIDE 0.9 % IV SOLN: 1.0000 g | INTRAVENOUS | Status: DC

## 2023-03-24 MED ORDER — VANCOMYCIN HCL IN DEXTROSE 1-5 GM/200ML-% IV SOLN: 1000.0000 mg | INTRAVENOUS | Status: DC

## 2023-03-24 MED ORDER — HEPARIN SODIUM (PORCINE) 5000 UNIT/ML IJ SOLN: 5000.0000 [IU] | Freq: Three times a day (TID) | INTRAMUSCULAR | Status: DC

## 2023-03-24 MED ORDER — CINNAMON 500 MG PO CAPS: 500.0000 mg | ORAL_CAPSULE | Freq: Two times a day (BID) | ORAL | Status: DC

## 2023-03-24 MED ORDER — HYDRALAZINE HCL 20 MG/ML IJ SOLN: 5.0000 mg | INTRAMUSCULAR | Status: DC | PRN

## 2023-03-24 MED ORDER — METRONIDAZOLE 500 MG/100ML IV SOLN
500.0000 mg | Freq: Once | INTRAVENOUS | Status: AC
  Administered 2023-03-24: 500 mg via INTRAVENOUS
  Filled 2023-03-24: qty 100

## 2023-03-24 MED ORDER — ALBUTEROL SULFATE (2.5 MG/3ML) 0.083% IN NEBU: 3.0000 mL | INHALATION_SOLUTION | RESPIRATORY_TRACT | Status: DC | PRN

## 2023-03-24 MED ORDER — OXYCODONE HCL 5 MG PO TABS: 5.0000 mg | ORAL_TABLET | Freq: Four times a day (QID) | ORAL | Status: DC | PRN

## 2023-03-24 MED ORDER — ADULT MULTIVITAMIN W/MINERALS CH: 1.0000 | ORAL_TABLET | ORAL | Status: DC

## 2023-03-24 NOTE — H&P (Signed)
History and Physical    [name]:[id number] DOB: 12-21-1970 DOA: 03/24/2023  Referring MD/NP/PA:   PCP: [name] Goodell, MD   Patient coming from:  The patient is coming from home.     Chief Complaint: Fever, chills, weakness  HPI: [name] is a 52 y.o. male with medical history significant of ESRD-HD (TTS), HTN. HJLD, DM, COPD, PVD on Eliquis, gout, obesity, chronic ulcer in left leg and foot, who presents with fever, chills, weakness.  Pt is brought in from dialysis center. Pt has received 3-hour dialysis treatment from the usual 4 1/2 hr of treatment.  During the dialysis, patient developed fever and chills.  His temperature is 102.6 in ED.  Patient has fatigue and generalized weakness.  Denies cough, shortness of breath or chest pain.  He has nausea and vomited once earlier, which has resolved.  Currently no nausea, vomiting, diarrhea or abdominal pain.  Patient does not make urine anymore, no symptoms of UTI.  Patient has chronic wound in left lower leg which is being taken care of by wound care nurse at home.  He denies worsening pain in the left lower leg wound.  Denies tenderness over left arm AV fistula.  Data reviewed independently and ED Course: pt was found to have WBC 10.2, potassium 3.6, bicarbonate of 27, creatinine 7.85, BUN 43, negative PCR for COVID, flu and RSV.  Temperature 102.6, blood pressure 136/45, heart rate 80, RR 29, oxygen saturation 96% on room air.  Chest x-ray showed cardiomegaly, mild vascular congestion, negative for infiltration.  X-ray of left foot is negative for evidence of osteomyelitis.  Patient is admitted to MedSurg bed as inpatient.  Dr. Thedore Mins of renal was consulted.  EKG: I have personally reviewed.  Sinus rhythm, QTc 411, LAE, RAD, poor R wave progression   Review of Systems:   General: has fevers, chills, no body weight gain, has fatigue HEENT: no blurry vision, hearing changes or sore throat Respiratory: no dyspnea, coughing,  wheezing CV: no chest pain, no palpitations GI: had nausea, vomiting, no abdominal pain, diarrhea, constipation GU: no dysuria, burning on urination, increased urinary frequency, hematuria  Ext: has leg edema Neuro: no unilateral weakness, numbness, or tingling, no vision change or hearing loss Skin: has wound in left lower leg and foot MSK: No muscle spasm, no deformity, no limitation of range of movement in spin Heme: No easy bruising.  Travel history: No recent long distant travel.   Allergy:  Allergies  Allergen Reactions   Lisinopril Swelling    Lips mouth    Furosemide Cough    Light headed, blurry vision, weakness.   Latex Itching   Adhesive [Tape] Itching and Rash    Past Medical History:  Diagnosis Date   Anemia    Chronic kidney disease    14% FUNCTION   Diabetes mellitus without complication (HCC)    Diabetic retinopathy (HCC)    Dyspnea    DOE   Dysrhythmia    End stage renal disease (HCC)    History of orthopnea    error wrong patietn   Hypercholesteremia    error   Hyperkalemia    error wrong patient   Hypertension    Long-term insulin use (HCC)    Lymphedema of leg    Metabolic encephalopathy    error   Microalbuminuria    MSSA (methicillin susceptible Staphylococcus aureus)    Obesity    PVD (peripheral vascular disease) (HCC)    Respiratory failure (HCC)  Error   Sepsis (HCC)    error   Sleep apnea    no CPAP   UTI (urinary tract infection)    error   Venous ulcer (HCC)    Vitamin D deficiency     Past Surgical History:  Procedure Laterality Date   A/V FISTULAGRAM Right 10/24/2018   Procedure: A/V FISTULAGRAM;  Surgeon: Renford Dills, MD;  Location: [facility];  Service: Cardiovascular;  Laterality: Right;   A/V FISTULAGRAM Right 11/24/2018   Procedure: A/V FISTULAGRAM;  Surgeon: Renford Dills, MD;  Location: [facility];  Service: Cardiovascular;  Laterality: Right;   A/V FISTULAGRAM Left 07/31/2019    Procedure: A/V FISTULAGRAM;  Surgeon: Renford Dills, MD;  Location: [facility];  Service: Cardiovascular;  Laterality: Left;   A/V FISTULAGRAM Left 12/11/2019   Procedure: A/V FISTULAGRAM;  Surgeon: Renford Dills, MD;  Location: [facility];  Service: Cardiovascular;  Laterality: Left;   A/V SHUNT INTERVENTION Right 02/21/2019   Procedure: A/V SHUNT INTERVENTION;  Surgeon: Renford Dills, MD;  Location: [facility];  Service: Cardiovascular;  Laterality: Right;   APPLICATION OF WOUND VAC Left 10/03/2017   Procedure: APPLICATION OF WOUND VAC;  Surgeon: Annice Needy, MD;  Location: [facility];  Service: General;  Laterality: Left;   APPLICATION OF WOUND VAC Left 10/11/2017   Procedure: APPLICATION OF WOUND VAC;  Surgeon: Renford Dills, MD;  Location: [facility];  Service: Vascular;  Laterality: Left;   APPLICATION OF WOUND VAC Left 10/14/2017   Procedure: WOUND VAC CHANGE;  Surgeon: Renford Dills, MD;  Location: [facility];  Service: Vascular;  Laterality: Left;  left lower leg   APPLICATION OF WOUND VAC Left 10/18/2017   Procedure: WOUND VAC CHANGE;  Surgeon: Renford Dills, MD;  Location: [facility];  Service: Vascular;  Laterality: Left;   APPLICATION OF WOUND VAC Left 10/07/2017   Procedure: APPLICATION OF WOUND VAC;  Surgeon: Renford Dills, MD;  Location: [facility];  Service: Vascular;  Laterality: Left;   APPLICATION OF WOUND VAC Left 11/17/2022   Procedure: Wound vac exchange;  Surgeon: Renford Dills, MD;  Location: [facility];  Service: Vascular;  Laterality: Left;   APPLICATION OF WOUND VAC Left 11/10/2022   Procedure: APPLICATION OF WOUND VAC;  Surgeon: Renford Dills, MD;  Location: [facility];  Service: Vascular;  Laterality: Left;   APPLICATION OF WOUND VAC Left 11/24/2022   Procedure: WOUND VAC REMOVAL;  Surgeon: Renford Dills, MD;  Location: [facility];  Service: Vascular;  Laterality: Left;   APPLICATION OF WOUND VAC Left 11/12/2022    Procedure: APPLICATION OF WOUND VAC;  Surgeon: Renford Dills, MD;  Location: [facility];  Service: Vascular;  Laterality: Left;   APPLICATION OF WOUND VAC Left 01/06/2023   Procedure: APPLICATION OF WOUND VAC;  Surgeon: Renford Dills, MD;  Location: [facility];  Service: Vascular;  Laterality: Left;   AV FISTULA INSERTION W/ RF MAGNETIC GUIDANCE Right 07/19/2018   Procedure: AV FISTULA INSERTION W/RF MAGNETIC GUIDANCE;  Surgeon: Renford Dills, MD;  Location: [facility];  Service: Cardiovascular;  Laterality: Right;   AV FISTULA PLACEMENT Left 05/11/2019   Procedure: ARTERIOVENOUS (AV) FISTULA CREATION (RADIOCEPHALIC );  Surgeon: Renford Dills, MD;  Location: [facility];  Service: Vascular;  Laterality: Left;   AV FISTULA PLACEMENT Left 02/20/2020   Procedure: INSERTION OF ARTERIOVENOUS (AV) GORE-TEX GRAFT LEFT ARM ( FOREARM LOOP );  Surgeon: Renford Dills, MD;  Location: [facility];  Service: Vascular;  Laterality: Left;   CATARACT EXTRACTION W/PHACO Left 11/01/2018   Procedure: CATARACT EXTRACTION PHACO AND INTRAOCULAR LENS PLACEMENT (IOC)-LEFT, DIABETIC-INSULIN DEPENDENT;  Surgeon: Nevada Crane, MD;  Location: [facility];  Service: Ophthalmology;  Laterality: Left;  [place] 00:41.8 CDE 4.61 Fluid Pack Lot # [id number]   CATARACT EXTRACTION W/PHACO Right 04/27/2019   Procedure: CATARACT EXTRACTION PHACO AND INTRAOCULAR LENS PLACEMENT (IOC);  Surgeon: Nevada Crane, MD;  Location: [facility];  Service: Ophthalmology;  Laterality: Right;  [place] 00:34 CDE 1.97 FLUID PACK LOT # [id number]    COLONOSCOPY WITH PROPOFOL N/A 05/12/2020   Procedure: COLONOSCOPY WITH PROPOFOL;  Surgeon: Toledo, Boykin Nearing, MD;  Location: [facility];  Service: Gastroenterology;  Laterality: N/A;   DIALYSIS/PERMA CATHETER INSERTION N/A 02/23/2019   Procedure: DIALYSIS/PERMA CATHETER INSERTION;  Surgeon: Annice Needy, MD;  Location: [facility];  Service: Cardiovascular;  Laterality: N/A;    DIALYSIS/PERMA CATHETER REMOVAL N/A 04/03/2020   Procedure: DIALYSIS/PERMA CATHETER REMOVAL;  Surgeon: Renford Dills, MD;  Location: [facility];  Service: Cardiovascular;  Laterality: N/A;   DIALYSIS/PERMA CATHETER REMOVAL N/A 02/03/2022   Procedure: DIALYSIS/PERMA CATHETER REMOVAL;  Surgeon: Renford Dills, MD;  Location: [facility];  Service: Cardiovascular;  Laterality: N/A;   I & D EXTREMITY Left 10/11/2017   Procedure: IRRIGATION AND DEBRIDEMENT EXTREMITY;  Surgeon: Renford Dills, MD;  Location: [facility];  Service: Vascular;  Laterality: Left;   INCISION AND DRAINAGE ABSCESS Right 10/07/2017   Procedure: INCISION AND DRAINAGE ABSCESS;  Surgeon: Renford Dills, MD;  Location: [facility];  Service: Vascular;  Laterality: Right;   INCISION AND DRAINAGE OF WOUND Left 11/17/2022   Procedure: DEBRIDEMENT WOUND;  Surgeon: Renford Dills, MD;  Location: [facility];  Service: Vascular;  Laterality: Left;   INCISION AND DRAINAGE OF WOUND Left 11/24/2022   Procedure: DEBRIDEMENT WOUND LEFT LOWER EXTREMITY;  Surgeon: Renford Dills, MD;  Location: [facility];  Service: Vascular;  Laterality: Left;   IRRIGATION AND DEBRIDEMENT ABSCESS Left 10/03/2017   Procedure: IRRIGATION AND DEBRIDEMENT ABSCESS with debridement of skin, soft tissue, muscle 50sq cm;  Surgeon: Annice Needy, MD;  Location: [facility];  Service: General;  Laterality: Left;   LOWER EXTREMITY ANGIOGRAPHY Left 10/15/2022   Procedure: Lower Extremity Angiography;  Surgeon: Renford Dills, MD;  Location: [facility];  Service: Cardiovascular;  Laterality: Left;   TEMPORARY DIALYSIS CATHETER  02/20/2019   Procedure: TEMPORARY DIALYSIS CATHETER;  Surgeon: Renford Dills, MD;  Location: [facility];  Service: Cardiovascular;;   UPPER EXTREMITY ANGIOGRAPHY Left 09/18/2019   Procedure: UPPER EXTREMITY ANGIOGRAPHY;  Surgeon: Renford Dills, MD;  Location: [facility];  Service:  Cardiovascular;  Laterality: Left;   WOUND DEBRIDEMENT Left 11/10/2022   Procedure: DEBRIDEMENT WOUND;  Surgeon: Renford Dills, MD;  Location: [facility];  Service: Vascular;  Laterality: Left;   WOUND DEBRIDEMENT Left 11/12/2022   Procedure: DEBRIDEMENT WOUND;  Surgeon: Renford Dills, MD;  Location: [facility];  Service: Vascular;  Laterality: Left;   WOUND DEBRIDEMENT Left 01/06/2023   Procedure: DEBRIDEMENT WOUND;  Surgeon: Renford Dills, MD;  Location: [facility];  Service: Vascular;  Laterality: Left;    Social History:  reports that he has quit smoking. His smoking use included cigars. He has been exposed to tobacco smoke. He quit smokeless tobacco use about 5 years ago. He  reports current alcohol use of about 1.0 standard drink of alcohol per week. He reports that he does not currently use drugs.  Family History:  Family History  Problem Relation Age of Onset   Diabetes Father    Kidney disease Father    Diabetes Daughter      Prior to Admission medications   Medication Sig Start Date End Date Taking? Authorizing Provider  ACCU-CHEK GUIDE test strip USE 1 STRIP TOTAL 2 TIMES DAILY FOR 90 DAYS 01/10/23   [provider]  acetaminophen (TYLENOL) 325 MG tablet Take 2 tablets (650 mg total) by mouth every 6 (six) hours as needed for mild pain (or Fever >/= 101). 10/05/22   Lurene Shadow, MD  allopurinol (ZYLOPRIM) 100 MG tablet Take 100 mg by mouth daily.    [provider]  amLODipine (NORVASC) 10 MG tablet Take 5 mg by mouth daily as needed (if bp is 190/90 or higher).     [provider]  apixaban (ELIQUIS) 5 MG TABS tablet Take 1 tablet (5 mg total) by mouth 2 (two) times daily. 11/25/22   Pace, Peggye Ley, NP  atenolol (TENORMIN) 50 MG tablet Take 25 mg by mouth daily as needed (if bp is 190/90 or higher).     [provider]  atorvastatin (LIPITOR) 10 MG tablet Take 1 tablet (10 mg total) by mouth daily. Further refills per primary care 03/10/23  03/09/24  Georgiana Spinner, NP  B Complex-C-Folic Acid (RENA-VITE RX) 1 MG TABS Take 1 tablet by mouth 3 (three) times a week.  08/30/19   [provider]  calcium acetate (PHOSLO) 667 MG capsule Take 1,334-2,001 mg by mouth See admin instructions. Take 2001 mg by mouth with each meal & take 1334 mg by mouth with each snack. 01/23/19   [provider]  cephALEXin (KEFLEX) 500 MG capsule Take 500 mg by mouth every morning. 01/04/23   [provider]  cinacalcet (SENSIPAR) 30 MG tablet Take 30 mg by mouth daily.    [provider]  CINNAMON PO Take 500 mg by mouth 2 (two) times daily.     [provider]  ciprofloxacin (CIPRO) 500 MG tablet Take 1 tablet (500 mg total) by mouth daily with breakfast. Patient not taking: Reported on 02/07/2023 01/04/23   Georgiana Spinner, NP  clopidogrel (PLAVIX) 75 MG tablet TAKE 1 TABLET BY MOUTH EVERY DAY 01/10/23   Georgiana Spinner, NP  Continuous Glucose Sensor (DEXCOM G7 SENSOR) MISC USE 1 EACH EVERY 10 (TEN) DAYS 11/17/22   [provider]  doxycycline (VIBRAMYCIN) 100 MG capsule Take 1 capsule (100 mg total) by mouth 2 (two) times daily. 02/08/23   Schnier, Latina Craver, MD  Insulin Glargine (LANTUS SOLOSTAR) 100 UNIT/ML Solostar Pen Inject 15 Units into the skin at bedtime.     [provider]  lidocaine-prilocaine (EMLA) cream Apply 1 application topically as needed (port access).    [provider]  Multiple Vitamin (MULTIVITAMIN WITH MINERALS) TABS tablet Take 1 tablet by mouth 3 (three) times a week. Men's Multivitamin    [provider]  Multiple Vitamins-Minerals (EYE HEALTH PO) Take 1 capsule by mouth daily.     [provider]  oxyCODONE (OXY IR/ROXICODONE) 5 MG immediate release tablet Take 1 tablet (5 mg total) by mouth every 6 (six) hours as needed for moderate pain or severe pain. 01/04/23   Georgiana Spinner, NP  Polyethyl Glycol-Propyl Glycol (SYSTANE OP) Place 1 drop into both  eyes daily as needed (dry eyes).    [provider]  sildenafil (REVATIO) 20 MG tablet Take 1 tablet daily.  Do not take with nitrates. 01/17/23   Michiel Cowboy A, PA-C  traZODone (DESYREL) 50 MG tablet Take 50 mg by mouth at bedtime. 11/04/22   [provider]    Physical Exam: Vitals:   03/24/23 1030 03/24/23 1100 03/24/23 1117 03/24/23 1248  BP: 133/76 (!) 136/45  126/74  Pulse: 77 80  74  Resp: (!) 29 (!) 28  18  Temp:   99.5 F (37.5 C) 99.5 F (37.5 C)  TempSrc:   Oral   SpO2: 96% 96%  100%  Weight:      Height:       General: Not in acute distress HEENT:       Eyes: PERRL, EOMI, no jaundice       ENT: No discharge from the ears and nose, no pharynx injection, no tonsillar enlargement.        Neck: No JVD, no bruit, no mass felt. Heme: No neck lymph node enlargement. Cardiac: S1/S2, RRR, No murmurs, No gallops or rubs. Respiratory: No rales, wheezing, rhonchi or rubs. GI: Soft, nondistended, nontender, no rebound pain, no organomegaly, BS present. GU: No hematuria Ext: has 1+ pitting leg edema bilaterally. 1+DP/PT pulse bilaterally. Musculoskeletal: No joint deformities, No joint redness or warmth, no limitation of ROM in spin. Skin: has a small healing wound in the dorsal side of left foot without any drainage. Has a large open wound in left lower leg, with granulation, no obvious drainage.      Neuro: Alert, oriented X3, cranial nerves II-XII grossly intact, moves all extremities normally.  Psych: Patient is not psychotic, no suicidal or hemocidal ideation.  Labs on Admission: I have personally reviewed following labs and imaging studies  CBC: Recent Labs  Lab 03/24/23 0941  WBC 10.2  NEUTROABS 9.6*  HGB 12.4*  HCT 39.4  MCV 93.6  PLT 163   Basic Metabolic Panel: Recent Labs  Lab 03/24/23 0941  NA 135  K 3.6  CL 97*  CO2 27  GLUCOSE 137*  BUN 43*  CREATININE 7.85*  CALCIUM 9.1   GFR: Estimated Creatinine Clearance: 18.8  mL/min (A) (by C-G formula based on SCr of 7.85 mg/dL (H)). Liver Function Tests: Recent Labs  Lab 03/24/23 0941  AST 18  ALT 15  ALKPHOS 56  BILITOT 0.9  PROT 9.1*  ALBUMIN 3.9   No results for input(s): "LIPASE", "AMYLASE" in the last 168 hours. No results for input(s): "AMMONIA" in the last 168 hours. Coagulation Profile: Recent Labs  Lab 03/24/23 0941  INR 1.0   Cardiac Enzymes: No results for input(s): "CKTOTAL", "CKMB", "CKMBINDEX", "TROPONINI" in the last 168 hours. BNP (last 3 results) No results for input(s): "PROBNP" in the last 8760 hours. HbA1C: No results for input(s): "HGBA1C" in the last 72 hours. CBG: Recent Labs  Lab 03/24/23 1243 03/24/23 1712  GLUCAP 134* 176*   Lipid Profile: No results for input(s): "CHOL", "HDL", "LDLCALC", "TRIG", "CHOLHDL", "LDLDIRECT" in the last 72 hours. Thyroid Function Tests: No results for input(s): "TSH", "T4TOTAL", "FREET4", "T3FREE", "THYROIDAB" in the last 72 hours. Anemia Panel: No results for input(s): "VITAMINB12", "FOLATE", "FERRITIN", "TIBC", "IRON", "RETICCTPCT" in the last 72 hours. Urine analysis:    Component Value Date/Time   COLORURINE YELLOW (A) 10/03/2017 1120   APPEARANCEUR CLOUDY (A) 10/03/2017 1120   LABSPEC 1.015 10/03/2017 1120   PHURINE 5.0 10/03/2017 1120  GLUCOSEU NEGATIVE 10/03/2017 1120   HGBUR SMALL (A) 10/03/2017 1120   BILIRUBINUR NEGATIVE 10/03/2017 1120   KETONESUR NEGATIVE 10/03/2017 1120   PROTEINUR 30 (A) 10/03/2017 1120   NITRITE NEGATIVE 10/03/2017 1120   LEUKOCYTESUR NEGATIVE 10/03/2017 1120   Sepsis Labs: @LABRCNTIP (procalcitonin:4,lacticidven:4) ) Recent Results (from the past 240 hour(s))  Resp panel by RT-PCR (RSV, Flu A&B, Covid) Anterior Nasal Swab     Status: None   Collection Time: 03/24/23  9:55 AM   Specimen: Anterior Nasal Swab  Result Value Ref Range Status   SARS Coronavirus 2 by RT PCR NEGATIVE NEGATIVE Final    Comment: (NOTE) SARS-CoV-2 target nucleic  acids are NOT DETECTED.  The SARS-CoV-2 RNA is generally detectable in upper respiratory specimens during the acute phase of infection. The lowest concentration of SARS-CoV-2 viral copies this assay can detect is 138 copies/mL. A negative result does not preclude SARS-Cov-2 infection and should not be used as the sole basis for treatment or other patient management decisions. A negative result may occur with  improper specimen collection/handling, submission of specimen other than nasopharyngeal swab, presence of viral mutation(s) within the areas targeted by this assay, and inadequate number of viral copies(<138 copies/mL). A negative result must be combined with clinical observations, patient history, and epidemiological information. The expected result is Negative.  Fact Sheet for Patients:  BloggerCourse.com  Fact Sheet for Healthcare Providers:  SeriousBroker.it  This test is no t yet approved or cleared by the [place] FDA and  has been authorized for detection and/or diagnosis of SARS-CoV-2 by FDA under an Emergency Use Authorization (EUA). This EUA will remain  in effect (meaning this test can be used) for the duration of the COVID-19 declaration under Section 564(b)(1) of the Act, 21 U.S.C.section 360bbb-3(b)(1), unless the authorization is terminated  or revoked sooner.       Influenza A by PCR NEGATIVE NEGATIVE Final   Influenza B by PCR NEGATIVE NEGATIVE Final    Comment: (NOTE) The Xpert Xpress SARS-CoV-2/FLU/RSV plus assay is intended as an aid in the diagnosis of influenza from Nasopharyngeal swab specimens and should not be used as a sole basis for treatment. Nasal washings and aspirates are unacceptable for Xpert Xpress SARS-CoV-2/FLU/RSV testing.  Fact Sheet for Patients: BloggerCourse.com  Fact Sheet for Healthcare Providers: SeriousBroker.it  This  test is not yet approved or cleared by the [place] FDA and has been authorized for detection and/or diagnosis of SARS-CoV-2 by FDA under an Emergency Use Authorization (EUA). This EUA will remain in effect (meaning this test can be used) for the duration of the COVID-19 declaration under Section 564(b)(1) of the Act, 21 U.S.C. section 360bbb-3(b)(1), unless the authorization is terminated or revoked.     Resp Syncytial Virus by PCR NEGATIVE NEGATIVE Final    Comment: (NOTE) Fact Sheet for Patients: BloggerCourse.com  Fact Sheet for Healthcare Providers: SeriousBroker.it  This test is not yet approved or cleared by the [place] FDA and has been authorized for detection and/or diagnosis of SARS-CoV-2 by FDA under an Emergency Use Authorization (EUA). This EUA will remain in effect (meaning this test can be used) for the duration of the COVID-19 declaration under Section 564(b)(1) of the Act, 21 U.S.C. section 360bbb-3(b)(1), unless the authorization is terminated or revoked.  Performed at [facility], [address] [place], [place] [address]      Radiological Exams on Admission: DG Chest Portable 1 View  Result Date: 03/24/2023 CLINICAL DATA:  Sepsis. EXAM: PORTABLE CHEST 1  VIEW COMPARISON:  None Available. FINDINGS: The heart is mildly enlarged. Mild pulmonary vascular congestion. No focal consolidation or large pleural effusion. No acute osseous abnormality. IMPRESSION: Mild cardiomegaly with mild pulmonary vascular congestion. No focal consolidation or pleural effusion. Electronically Signed   By: Larose Hires D.O.   On: 03/24/2023 10:46   DG Foot Complete Left  Result Date: 03/24/2023 CLINICAL DATA:  Evaluate for osteomyelitis. Patient complains tube wounds to the left foot. EXAM: LEFT FOOT - COMPLETE 3+ VIEW COMPARISON:  None Available. FINDINGS: There is no evidence of fracture or dislocation. There is  no cortical erosion or periosteal reaction to suggest osteomyelitis. Marked soft tissue swelling of the foot and ankle. IMPRESSION: 1. No radiographic evidence of osteomyelitis. Evaluation of early osteomyelitis is limited on radiographs, if there is clinical concern further evaluation with MR examination is suggested. 2. Marked soft tissue swelling of the foot and ankle. Electronically Signed   By: Larose Hires D.O.   On: 03/24/2023 10:44      Assessment/Plan Principal Problem:   Sepsis (HCC) Active Problems:   Leg wound, left   Hypertension   End stage renal disease (HCC)   Type 1 diabetes mellitus (HCC)   Gout   HLD (hyperlipidemia)   COPD (chronic obstructive pulmonary disease) (HCC)   PVD (peripheral vascular disease) (HCC)   Obesity, Class III, BMI 40-49.9 (morbid obesity) (HCC)   Assessment and Plan:  Sepsis (HCC): pt has sepsis with fever well 2.6, RR 29.  Lactic acid 1.9.  Source of infection is not very clear, possibly due to left leg wound with infection.  Chest x-ray negative for infiltration.  Patient does not have symptoms of pneumonia.  Patient is not making urine, no symptoms of UTI.  His left arm AV fistula looks okay to me, no signs of infection, no tenderness or erythema.  - will admit to med-surg bed as inpatient - Empiric antimicrobial treatment with vancomycin and cefepime (patient received 1 dose of Flagyl 7 in ED) - Blood cultures x 2  - ESR and CRP - wound care consult - will get Procalcitonin - IVF: 50 cc/h of NS  Left leg wound: -wound care consult -on antibiotics as above  Hypertension: -IV hydralazine as needed -Hold amlodipine and atenolol since patient at risk of developing hypotension due to sepsis  End stage renal disease on HD (TTS) -Consulted Dr. Thedore Mins of renal for dialysis  Type 1 diabetes mellitus with renal complication: Recent A1c 8.8, poorly controlled.  Patient taking Lantus 20 units daily -Glargine insulin 15 units daily -Sliding  scale insulin  Gout -Allopurinol  HLD (hyperlipidemia) -Lipitor  COPD (chronic obstructive pulmonary disease) (HCC): Stable -Bronchodilators as needed  PVD (peripheral vascular disease) (HCC) -Continue Eliquis, Plavix, Lipitor  Obesity, Class III, BMI 40-49.9 (morbid obesity) (HCC): Body weight 175.4 kg, BMI 47.07 -Exercise and healthy diet -Encouraged losing weight        DVT ppx: on Eliquis  Code Status: Full code  \  Family Communication: not done, no family member is at bed side  Disposition Plan:  Anticipate discharge back to previous environment  Consults called: Dr. Thedore Mins of renal  Admission status and Level of care: Med-Surg:   as inpt      Dispo: The patient is from: Home              Anticipated d/c is to: Home              Anticipated d/c date is: 2 days  Patient currently is not medically stable to d/c.    Severity of Illness:  The appropriate patient status for this patient is INPATIENT. Inpatient status is judged to be reasonable and necessary in order to provide the required intensity of service to ensure the patient's safety. The patient's presenting symptoms, physical exam findings, and initial radiographic and laboratory data in the context of their chronic comorbidities is felt to place them at high risk for further clinical deterioration. Furthermore, it is not anticipated that the patient will be medically stable for discharge from the hospital within 2 midnights of admission.   * I certify that at the point of admission it is my clinical judgment that the patient will require inpatient hospital care spanning beyond 2 midnights from the point of admission due to high intensity of service, high risk for further deterioration and high frequency of surveillance required.*       Date of Service 03/24/2023    Lorretta Harp [facility]   If 7PM-7AM, please contact night-coverage [email or web address] 03/24/2023, 5:47 PM

## 2023-03-24 NOTE — Progress Notes (Signed)
PHARMACY - PHYSICIAN COMMUNICATION CRITICAL VALUE ALERT - BLOOD CULTURE IDENTIFICATION (BCID)  Results for orders placed or performed during the hospital encounter of 03/24/23  Culture, blood (Routine x 2)     Status: None (Preliminary result)   Collection Time: 03/24/23  9:41 AM   Specimen: BLOOD  Result Value Ref Range Status   Specimen Description BLOOD BLOOD RIGHT ARM  Final   Special Requests   Final    BOTTLES DRAWN AEROBIC AND ANAEROBIC Blood Culture results may not be optimal due to an excessive volume of blood received in culture bottles   Culture  Setup Time   Final    Organism ID to follow GRAM POSITIVE COCCI IN BOTH AEROBIC AND ANAEROBIC BOTTLES CRITICAL RESULT CALLED TO, READ BACK BY AND VERIFIED WITH: Dann Galicia BELEU AT 2141 ON 03/24/23 BY SS Performed at [facility], [address] [place], [place] [address]    Culture GRAM POSITIVE COCCI  Final   Report Status PENDING  Incomplete  Blood Culture ID Panel (Reflexed)     Status: Abnormal   Collection Time: 03/24/23  9:41 AM  Result Value Ref Range Status   Enterococcus faecalis NOT DETECTED NOT DETECTED Final   Enterococcus Faecium NOT DETECTED NOT DETECTED Final   Listeria monocytogenes NOT DETECTED NOT DETECTED Final   Staphylococcus species NOT DETECTED NOT DETECTED Final   Staphylococcus aureus (BCID) NOT DETECTED NOT DETECTED Final   Staphylococcus epidermidis NOT DETECTED NOT DETECTED Final   Staphylococcus lugdunensis NOT DETECTED NOT DETECTED Final   Streptococcus species DETECTED (A) NOT DETECTED Final    Comment: Not Enterococcus species, Streptococcus agalactiae, Streptococcus pyogenes, or Streptococcus pneumoniae. CRITICAL RESULT CALLED TO, READ BACK BY AND VERIFIED WITH: Opel Lejeune AT 2141 ON 03/24/23 BY SS    Streptococcus agalactiae NOT DETECTED NOT DETECTED Final   Streptococcus pneumoniae NOT DETECTED NOT DETECTED Final   Streptococcus pyogenes NOT DETECTED NOT DETECTED Final    A.calcoaceticus-baumannii NOT DETECTED NOT DETECTED Final   Bacteroides fragilis NOT DETECTED NOT DETECTED Final   Enterobacterales NOT DETECTED NOT DETECTED Final   Enterobacter cloacae complex NOT DETECTED NOT DETECTED Final   Escherichia coli NOT DETECTED NOT DETECTED Final   Klebsiella aerogenes NOT DETECTED NOT DETECTED Final   Klebsiella oxytoca NOT DETECTED NOT DETECTED Final   Klebsiella pneumoniae NOT DETECTED NOT DETECTED Final   Proteus species NOT DETECTED NOT DETECTED Final   Salmonella species NOT DETECTED NOT DETECTED Final   Serratia marcescens NOT DETECTED NOT DETECTED Final   Haemophilus influenzae NOT DETECTED NOT DETECTED Final   Neisseria meningitidis NOT DETECTED NOT DETECTED Final   Pseudomonas aeruginosa NOT DETECTED NOT DETECTED Final   Stenotrophomonas maltophilia NOT DETECTED NOT DETECTED Final   Candida albicans NOT DETECTED NOT DETECTED Final   Candida auris NOT DETECTED NOT DETECTED Final   Candida glabrata NOT DETECTED NOT DETECTED Final   Candida krusei NOT DETECTED NOT DETECTED Final   Candida parapsilosis NOT DETECTED NOT DETECTED Final   Candida tropicalis NOT DETECTED NOT DETECTED Final   Cryptococcus neoformans/gattii NOT DETECTED NOT DETECTED Final    Comment: Performed at [facility] [address] [place], [place] [address]  Culture, blood (Routine x 2)     Status: None (Preliminary result)   Collection Time: 03/24/23  9:55 AM   Specimen: BLOOD RIGHT ARM  Result Value Ref Range Status   Specimen Description BLOOD RIGHT ARM  Final   Special Requests   Final    BOTTLES DRAWN AEROBIC  AND ANAEROBIC Blood Culture adequate volume   Culture  Setup Time   Final    GRAM POSITIVE COCCI IN BOTH AEROBIC AND ANAEROBIC BOTTLES CRITICAL VALUE NOTED.  VALUE IS CONSISTENT WITH PREVIOUSLY REPORTED AND CALLED VALUE. Performed at [facility], [address] [place], [place] [address]    Culture [facility] POSITIVE COCCI  Final   Report  Status PENDING  Incomplete  Resp panel by RT-PCR (RSV, Flu A&B, Covid) Anterior Nasal Swab     Status: None   Collection Time: 03/24/23  9:55 AM   Specimen: Anterior Nasal Swab  Result Value Ref Range Status   SARS Coronavirus 2 by RT PCR NEGATIVE NEGATIVE Final    Comment: (NOTE) SARS-CoV-2 target nucleic acids are NOT DETECTED.  The SARS-CoV-2 RNA is generally detectable in upper respiratory specimens during the acute phase of infection. The lowest concentration of SARS-CoV-2 viral copies this assay can detect is 138 copies/mL. A negative result does not preclude SARS-Cov-2 infection and should not be used as the sole basis for treatment or other patient management decisions. A negative result may occur with  improper specimen collection/handling, submission of specimen other than nasopharyngeal swab, presence of viral mutation(s) within the areas targeted by this assay, and inadequate number of viral copies(<138 copies/mL). A negative result must be combined with clinical observations, patient history, and epidemiological information. The expected result is Negative.  Fact Sheet for Patients:  BloggerCourse.com  Fact Sheet for Healthcare Providers:  SeriousBroker.it  This test is no t yet approved or cleared by the [place] FDA and  has been authorized for detection and/or diagnosis of SARS-CoV-2 by FDA under an Emergency Use Authorization (EUA). This EUA will remain  in effect (meaning this test can be used) for the duration of the COVID-19 declaration under Section 564(b)(1) of the Act, 21 U.S.C.section 360bbb-3(b)(1), unless the authorization is terminated  or revoked sooner.       Influenza A by PCR NEGATIVE NEGATIVE Final   Influenza B by PCR NEGATIVE NEGATIVE Final    Comment: (NOTE) The Xpert Xpress SARS-CoV-2/FLU/RSV plus assay is intended as an aid in the diagnosis of influenza from Nasopharyngeal swab  specimens and should not be used as a sole basis for treatment. Nasal washings and aspirates are unacceptable for Xpert Xpress SARS-CoV-2/FLU/RSV testing.  Fact Sheet for Patients: BloggerCourse.com  Fact Sheet for Healthcare Providers: SeriousBroker.it  This test is not yet approved or cleared by the [place] FDA and has been authorized for detection and/or diagnosis of SARS-CoV-2 by FDA under an Emergency Use Authorization (EUA). This EUA will remain in effect (meaning this test can be used) for the duration of the COVID-19 declaration under Section 564(b)(1) of the Act, 21 U.S.C. section 360bbb-3(b)(1), unless the authorization is terminated or revoked.     Resp Syncytial Virus by PCR NEGATIVE NEGATIVE Final    Comment: (NOTE) Fact Sheet for Patients: BloggerCourse.com  Fact Sheet for Healthcare Providers: SeriousBroker.it  This test is not yet approved or cleared by the [place] FDA and has been authorized for detection and/or diagnosis of SARS-CoV-2 by FDA under an Emergency Use Authorization (EUA). This EUA will remain in effect (meaning this test can be used) for the duration of the COVID-19 declaration under Section 564(b)(1) of the Act, 21 U.S.C. section 360bbb-3(b)(1), unless the authorization is terminated or revoked.  Performed at [facility], [address], [place], [place] [address]     BCID Results: BCID results:  4 of 4 bottles  with Strep Species, no resistance dectected.  Pt w/ ESRD on HD currently on Cefepime & Vancomycin for sepsis & wound infection. WBC WNL, febrile in past 24 hrs.  No wound cultures ordered or pending this admission but "Wound cultures in the past grew: RARE ESCHERICHIA COLI, RARE SERRATIA MARCESCENS, RARE ENTEROCOCCUS FAECALIS, STREPTOCOCCUS PYOGENES."  Name of provider contacted: Cliffton Asters, NP   Changes to  prescribed antibiotics required: No changes at this time to allow attending provider to readdress in AM.  Otelia Sergeant, PharmD, [facility] 03/24/2023 10:24 PM

## 2023-03-24 NOTE — Plan of Care (Signed)

## 2023-03-24 NOTE — ED Provider Notes (Signed)
[facility] Provider Note    Event Date/Time   First MD Initiated Contact with Patient 03/24/23 [phone]     (approximate)   History   Emesis   HPI  [name] is a 52 y.o. male presents to the ER for evaluation of nausea vomiting generalized malaise shortness of breath and fever.  Symptoms started this morning while he was at dialysis.  Did take Tylenol about an hour ago.  Feeling generalized malaise.  Denies any pain.  Has chronic wound to the left leg denies any worsening pain or drainage to that area.     Physical Exam   Triage Vital Signs: ED Triage Vitals  Encounter Vitals Group     BP 03/24/23 0930 (!) 144/57     Systolic BP Percentile --      Diastolic BP Percentile --      Pulse Rate 03/24/23 0930 77     Resp 03/24/23 0930 (!) 28     Temp 03/24/23 0932 (!) 102.6 F (39.2 C)     Temp Source 03/24/23 0932 Oral     SpO2 03/24/23 0930 97 %     Weight 03/24/23 0931 (!) 386 lb 11.2 oz (175.4 kg)     Height 03/24/23 0931 6\' 4"  (1.93 m)     Head Circumference --      Peak Flow --      Pain Score 03/24/23 0931 5     Pain Loc --      Pain Education --      Exclude from Growth Chart --     Most recent vital signs: Vitals:   03/24/23 1100 03/24/23 1117  BP: (!) 136/45   Pulse: 80   Resp: (!) 28   Temp:  99.5 F (37.5 C)  SpO2: 96%      Constitutional: Alert, ill appearing Eyes: Conjunctivae are normal.  Head: Atraumatic. Nose: No congestion/rhinnorhea. Mouth/Throat: Mucous membranes are moist.   Neck: Painless ROM.  Cardiovascular:   Good peripheral circulation. Respiratory: Normal respiratory effort.  No retractions.  Gastrointestinal: Soft and nontender.  Musculoskeletal: Left lower extremity wrapped in a boot and brace. Neurologic:  MAE spontaneously. No gross focal neurologic deficits are appreciated.  Skin:  Skin is warm, dry and intact. No rash noted. Psychiatric: Mood and affect are normal. Speech and behavior are  normal.    ED Results / Procedures / Treatments   Labs (all labs ordered are listed, but only abnormal results are displayed) Labs Reviewed  COMPREHENSIVE METABOLIC PANEL - Abnormal; Notable for the following components:      Result Value   Chloride 97 (*)    Glucose, Bld 137 (*)    BUN 43 (*)    Creatinine, Ser 7.85 (*)    Total Protein 9.1 (*)    GFR, Estimated 8 (*)    All other components within normal limits  CBC WITH DIFFERENTIAL/PLATELET - Abnormal; Notable for the following components:   RBC 4.21 (*)    Hemoglobin 12.4 (*)    RDW 19.0 (*)    Neutro Abs 9.6 (*)    Lymphs Abs 0.3 (*)    All other components within normal limits  RESP PANEL BY RT-PCR (RSV, FLU A&B, COVID)  [id number]  CULTURE, BLOOD (ROUTINE X 2)  CULTURE, BLOOD (ROUTINE X 2)  LACTIC ACID, PLASMA  LACTIC ACID, PLASMA  C-REACTIVE PROTEIN  SEDIMENTATION RATE  PROTIME-INR  APTT  PROCALCITONIN     EKG  ED ECG REPORT I,  Willy Eddy, the attending physician, personally viewed and interpreted this ECG.   Date: 03/24/2023  EKG Time: 9:33  Rate: 75  Rhythm: sinus  Axis: lead reversal  Intervals: nrmal qt  ST&T Change: no stemi, nonspecific st abn    RADIOLOGY Please see ED Course for my review and interpretation.  I personally reviewed all radiographic images ordered to evaluate for the above acute complaints and reviewed radiology reports and findings.  These findings were personally discussed with the patient.  Please see medical record for radiology report.    PROCEDURES:  Critical Care performed: No  Procedures   MEDICATIONS ORDERED IN ED: Medications  lactated ringers infusion ( Intravenous New Bag/Given 03/24/23 1011)  vancomycin (VANCOCIN) IVPB 1000 mg/200 mL premix (1,000 mg Intravenous New Bag/Given 03/24/23 1114)  insulin aspart (novoLOG) injection 0-5 Units (has no administration in time range)  insulin aspart (novoLOG) injection 0-9 Units (has no administration in time  range)  heparin injection 5,000 Units (has no administration in time range)  ceFEPIme (MAXIPIME) 2 g in sodium chloride 0.9 % 100 mL IVPB (0 g Intravenous Stopped 03/24/23 1013)  metroNIDAZOLE (FLAGYL) IVPB 500 mg (0 mg Intravenous Stopped 03/24/23 1115)     IMPRESSION / MDM / ASSESSMENT AND PLAN / ED COURSE  I reviewed the triage vital signs and the nursing notes.                              Differential diagnosis includes, but is not limited to, Dehydration, sepsis, pna, uti, hypoglycemia, cva, drug effect, withdrawal,   Patient presenting to the ER for evaluation of symptoms as described above.  Based on symptoms, risk factors and considered above differential, this presenting complaint could reflect a potentially life-threatening illness therefore the patient will be placed on continuous pulse oximetry and telemetry for monitoring.  Laboratory evaluation will be sent to evaluate for the above complaints.      Clinical Course as of 03/24/23 1136  Thu Mar 24, 2023  1008 Chest x-ray my review and interpretation with patchy diffuse infiltrates versus edema. [PR]    Clinical Course User Index [PR] Willy Eddy, MD   No identifiable source.  Possibly related to cellulitis and chronic wound of left lower extremity this appears stable.  Also possibly translate bacteremic in setting of dialysis.  I have consulted with hospitalist for admission.   FINAL CLINICAL IMPRESSION(S) / ED DIAGNOSES   Final diagnoses:  Sepsis, due to unspecified organism, unspecified whether acute organ dysfunction present [facility])     Rx / DC Orders   ED Discharge Orders     None        Note:  This document was prepared using Dragon voice recognition software and may include unintentional dictation errors.    Willy Eddy, MD 03/24/23 1136

## 2023-03-24 NOTE — ED Triage Notes (Signed)
Pt BIB EMS from [facility]. Received 3 hrs from the usual 4 1/2 hr of treatment when pt had perfuse vomiting. Pt currently denying nausea. Left wound present being taken care of by wound care nurse at home. EMS vitals: BP 153/76 HR 70   Temp 98.2 F 98% RA

## 2023-03-24 NOTE — Consult Note (Signed)
Pharmacy Antibiotic Note  [name] is a 52 y.o. male admitted on 03/24/2023 with sepsis and wound infection .  Pharmacy has been consulted for cefepime and vancomycin dosing. Tmax 102.6, WBC 10.2, Procal 1.13. Pt has ESRD on HD TTS.  Wound cultures in the past grew:  RARE ESCHERICHIA COLI  RARE SERRATIA MARCESCENS  RARE ENTEROCOCCUS FAECALIS  STREPTOCOCCUS PYOGENES.   Plan: Patient received vancomycin 1000 mg loading dose x 1 in the ED. Will give 1500 mg x 1 for a loading dose of 2500 mg. Followed by vancomycin 1000 mg on HD days. Plan to order levels prior to the 3rd dose of vancomycin. Goal level 15-22.   Pt received cefepime 2 g x 1 followed by cefepime 1 g daily.   Height: 6\' 4"  (193 cm) Weight: (!) 175.4 kg (386 lb 11.2 oz) IBW/kg (Calculated) : 86.8  Temp (24hrs), Avg:100.5 F (38.1 C), Min:99.5 F (37.5 C), Max:102.6 F (39.2 C)  Recent Labs  Lab 03/24/23 0941 03/24/23 1229  WBC 10.2  --   CREATININE 7.85*  --   LATICACIDVEN 1.9 1.5    Estimated Creatinine Clearance: 18.8 mL/min (A) (by C-G formula based on SCr of 7.85 mg/dL (H)).    Allergies  Allergen Reactions   Lisinopril Swelling    Lips mouth    Furosemide Cough    Light headed, blurry vision, weakness.   Latex Itching   Adhesive [Tape] Itching and Rash    Antimicrobials this admission: 7/25 cefepime >>  7/25 vancomycin >>   Dose adjustments this admission: None  Microbiology results: 7/25 BCx: pending   Thank you for allowing pharmacy to be a part of this patient's care.  Ronnald Ramp, PharmD, BCPS 03/24/2023 5:47 PM

## 2023-03-24 NOTE — Consult Note (Signed)
PHARMACY -  BRIEF ANTIBIOTIC NOTE   Pharmacy has received consult(s) for sepsis from an ED provider.  The patient's profile has been reviewed for ht/wt/allergies/indication/available labs.    One time order(s) placed for cefepime and vancomycin  Further antibiotics/pharmacy consults should be ordered by admitting physician if indicated.                       Thank you, Ronnald Ramp, PharmD, BCPS 03/24/2023  9:53 AM

## 2023-03-25 DIAGNOSIS — R7881 Bacteremia: Secondary | ICD-10-CM | POA: Diagnosis not present

## 2023-03-25 DIAGNOSIS — B955 Unspecified streptococcus as the cause of diseases classified elsewhere: Secondary | ICD-10-CM | POA: Diagnosis not present

## 2023-03-25 DIAGNOSIS — I890 Lymphedema, not elsewhere classified: Secondary | ICD-10-CM

## 2023-03-25 DIAGNOSIS — A419 Sepsis, unspecified organism: Secondary | ICD-10-CM

## 2023-03-25 DIAGNOSIS — E1122 Type 2 diabetes mellitus with diabetic chronic kidney disease: Secondary | ICD-10-CM | POA: Diagnosis not present

## 2023-03-25 DIAGNOSIS — Z992 Dependence on renal dialysis: Secondary | ICD-10-CM

## 2023-03-25 DIAGNOSIS — N186 End stage renal disease: Secondary | ICD-10-CM | POA: Diagnosis not present

## 2023-03-25 LAB — HEPATITIS B SURFACE ANTIGEN: Hepatitis B Surface Ag: NONREACTIVE

## 2023-03-25 LAB — GLUCOSE, CAPILLARY
Glucose-Capillary: 112 mg/dL — ABNORMAL HIGH (ref 70–99)
Glucose-Capillary: 125 mg/dL — ABNORMAL HIGH (ref 70–99)
Glucose-Capillary: 130 mg/dL — ABNORMAL HIGH (ref 70–99)
Glucose-Capillary: 118 mg/dL — ABNORMAL HIGH (ref 70–99)

## 2023-03-25 LAB — BASIC METABOLIC PANEL: Anion gap: 13 (ref 5–15)

## 2023-03-25 LAB — CBC: WBC: 15.9 10*3/uL — ABNORMAL HIGH (ref 4.0–10.5)

## 2023-03-25 MED ORDER — CHLORHEXIDINE GLUCONATE CLOTH 2 % EX PADS
6.0000 | MEDICATED_PAD | Freq: Every day | CUTANEOUS | Status: DC
  Administered 2023-03-26 – 2023-03-28 (×3): 6 via TOPICAL

## 2023-03-25 MED ORDER — EPOETIN ALFA 4000 UNIT/ML IJ SOLN
4000.0000 [IU] | INTRAMUSCULAR | Status: DC
  Administered 2023-03-26 – 2023-03-29 (×2): 4000 [IU] via INTRAVENOUS
  Filled 2023-03-25: qty 1

## 2023-03-25 MED ORDER — SODIUM CHLORIDE 0.9 % IV SOLN
2.0000 g | INTRAVENOUS | Status: AC
  Administered 2023-03-25 – 2023-03-28 (×4): 2 g via INTRAVENOUS
  Filled 2023-03-25 (×4): qty 20

## 2023-03-25 NOTE — Consult Note (Signed)
WOC Nurse Consult Note: patient with history of venous statis ulcers followed by vascular surgery and wound care clinic; last seen at wound care clinic 7/10 using Dakins and covering with Silver Hydrofiber; patient states he does have compression wraps and lymphedema pumps at home  Reason for Consult: L leg wounds  Wound type: Full thickness r/t venous ulcers  Pressure Injury POA: NA  Measurement: 1.  L anterior foot full thickness ulcer 0.5 cm x 1 cm 100% pink and moist  2.  L medial lower leg 8 cm x 8 cm x 0.2 cm 100% pink and moist   Drainage (amount, consistency, odor) minimal serosanguinous  Periwound:  edematous, dry scaly skin, changes consistent with lymphedema  Dressing procedure/placement/frequency: Clean L anterior foot and L medial leg wounds with NS, apply Silver Hydrofiber [place] [phone]) cut to fit wound bed daily.  Cover with ABD pad, wrap with Kerlix roll gauze starting just above toes and ending right below knee.  Secure with Ace bandage wrapped in same fashion as Kerlix.    POC discussed with patient and bedside nurse. Patient should resume care at Wound Care Clinic at discharge.  Patient states he has a home health nurse come out to his house Mondays and Fridays and goes to the Wound Care clinic on Wednesdays.    WOC team will not follow at this time. Re-consult if further needs arise.  Thank you,    Priscella Mann MSN, RN-BC, [employer] [phone]

## 2023-03-25 NOTE — Consult Note (Signed)
NAME: [name]  DOB: 09-10-1970  MRN: [id number]  Date/Time: 03/25/2023 1:51 PM  REQUESTING PROVIDER: Dr. Mayford Knife  Subjective:  REASON FOR CONSULT: Streptococcus bacteremia ? [name] is a 52 y.o. male with a history of end-stage renal disease on dialysis, diabetes mellitus, hypertension, bilateral lymphedema, chronic wound of the left leg followed at the wound clinic, PVD on Eliquis, gout, presented with fever and chills and weakness on 03/24/2023 While he was undergoing dialysis.  He was brought in from the dialysis center In the ED vitals BP of 144/57, temperature 99.3, pulse 77, respiratory rate 28 and sats of 97%.  He later had a temperature of 102.6 WBC was 10.2, Hb 12.4, platelet 163.  Blood cultures were sent. He was started on vancomycin and ceftriaxone Chest x-ray revealed mild cardiomegaly with pulmonary vascular congestion. X-ray of the left foot showed no evidence of osteomyelitis.  But marked soft tissue swelling. I am asked to see the patient because of blood cultures positive for Streptococcus Patient does not have any cough or shortness of breath He does not have any chest pain He has no abdominal pain No diarrhea He lives by himself He has been managing fairly well until this episode He goes to the wound center for management of the left leg wound.  He has a lymphedema pump  Past Medical History:  Diagnosis Date   Anemia    Chronic kidney disease    14% FUNCTION   Diabetes mellitus without complication (HCC)    Diabetic retinopathy (HCC)    Dyspnea    DOE   Dysrhythmia    End stage renal disease (HCC)    History of orthopnea    error wrong patietn   Hypercholesteremia    error   Hyperkalemia    error wrong patient   Hypertension    Long-term insulin use (HCC)    Lymphedema of leg    Metabolic encephalopathy    error   Microalbuminuria    MSSA (methicillin susceptible Staphylococcus aureus)    Obesity    PVD (peripheral vascular disease) (HCC)     Respiratory failure (HCC)    Error   Sepsis (HCC)    error   Sleep apnea    no CPAP   UTI (urinary tract infection)    error   Venous ulcer (HCC)    Vitamin D deficiency     Past Surgical History:  Procedure Laterality Date   A/V FISTULAGRAM Right 10/24/2018   Procedure: A/V FISTULAGRAM;  Surgeon: Renford Dills, MD;  Location: [facility];  Service: Cardiovascular;  Laterality: Right;   A/V FISTULAGRAM Right 11/24/2018   Procedure: A/V FISTULAGRAM;  Surgeon: Renford Dills, MD;  Location: [facility];  Service: Cardiovascular;  Laterality: Right;   A/V FISTULAGRAM Left 07/31/2019   Procedure: A/V FISTULAGRAM;  Surgeon: Renford Dills, MD;  Location: [facility];  Service: Cardiovascular;  Laterality: Left;   A/V FISTULAGRAM Left 12/11/2019   Procedure: A/V FISTULAGRAM;  Surgeon: Renford Dills, MD;  Location: [facility];  Service: Cardiovascular;  Laterality: Left;   A/V SHUNT INTERVENTION Right 02/21/2019   Procedure: A/V SHUNT INTERVENTION;  Surgeon: Renford Dills, MD;  Location: [facility];  Service: Cardiovascular;  Laterality: Right;   APPLICATION OF WOUND VAC Left 10/03/2017   Procedure: APPLICATION OF WOUND VAC;  Surgeon: Annice Needy, MD;  Location: [facility];  Service: General;  Laterality: Left;   APPLICATION OF  WOUND VAC Left 10/11/2017   Procedure: APPLICATION OF WOUND VAC;  Surgeon: Renford Dills, MD;  Location: [facility];  Service: Vascular;  Laterality: Left;   APPLICATION OF WOUND VAC Left 10/14/2017   Procedure: WOUND VAC CHANGE;  Surgeon: Renford Dills, MD;  Location: [facility];  Service: Vascular;  Laterality: Left;  left lower leg   APPLICATION OF WOUND VAC Left 10/18/2017   Procedure: WOUND VAC CHANGE;  Surgeon: Renford Dills, MD;  Location: [facility];  Service: Vascular;  Laterality: Left;   APPLICATION OF WOUND VAC Left 10/07/2017   Procedure: APPLICATION OF WOUND VAC;  Surgeon: Renford Dills, MD;  Location: [facility];  Service: Vascular;  Laterality: Left;   APPLICATION OF WOUND VAC Left 11/17/2022   Procedure: Wound vac exchange;  Surgeon: Renford Dills, MD;  Location: [facility];  Service: Vascular;  Laterality: Left;   APPLICATION OF WOUND VAC Left 11/10/2022   Procedure: APPLICATION OF WOUND VAC;  Surgeon: Renford Dills, MD;  Location: [facility];  Service: Vascular;  Laterality: Left;   APPLICATION OF WOUND VAC Left 11/24/2022   Procedure: WOUND VAC REMOVAL;  Surgeon: Renford Dills, MD;  Location: [facility];  Service: Vascular;  Laterality: Left;   APPLICATION OF WOUND VAC Left 11/12/2022   Procedure: APPLICATION OF WOUND VAC;  Surgeon: Renford Dills, MD;  Location: [facility];  Service: Vascular;  Laterality: Left;   APPLICATION OF WOUND VAC Left 01/06/2023   Procedure: APPLICATION OF WOUND VAC;  Surgeon: Renford Dills, MD;  Location: [facility];  Service: Vascular;  Laterality: Left;   AV FISTULA INSERTION W/ RF MAGNETIC GUIDANCE Right 07/19/2018   Procedure: AV FISTULA INSERTION W/RF MAGNETIC GUIDANCE;  Surgeon: Renford Dills, MD;  Location: [facility];  Service: Cardiovascular;  Laterality: Right;   AV FISTULA PLACEMENT Left 05/11/2019   Procedure: ARTERIOVENOUS (AV) FISTULA CREATION (RADIOCEPHALIC );  Surgeon: Renford Dills, MD;  Location: [facility];  Service: Vascular;  Laterality: Left;   AV FISTULA PLACEMENT Left 02/20/2020   Procedure: INSERTION OF ARTERIOVENOUS (AV) GORE-TEX GRAFT LEFT ARM ( FOREARM LOOP );  Surgeon: Renford Dills, MD;  Location: [facility];  Service: Vascular;  Laterality: Left;   CATARACT EXTRACTION W/PHACO Left 11/01/2018   Procedure: CATARACT EXTRACTION PHACO AND INTRAOCULAR LENS PLACEMENT (IOC)-LEFT, DIABETIC-INSULIN DEPENDENT;  Surgeon: Nevada Crane, MD;  Location: [facility];  Service: Ophthalmology;  Laterality: Left;  [place] 00:41.8 CDE 4.61 Fluid Pack Lot # [id number]   CATARACT EXTRACTION W/PHACO Right  04/27/2019   Procedure: CATARACT EXTRACTION PHACO AND INTRAOCULAR LENS PLACEMENT (IOC);  Surgeon: Nevada Crane, MD;  Location: [facility];  Service: Ophthalmology;  Laterality: Right;  [place] 00:34 CDE 1.97 FLUID PACK LOT # [id number]    COLONOSCOPY WITH PROPOFOL N/A 05/12/2020   Procedure: COLONOSCOPY WITH PROPOFOL;  Surgeon: [place], Boykin Nearing, MD;  Location: [facility];  Service: Gastroenterology;  Laterality: N/A;   DIALYSIS/PERMA CATHETER INSERTION N/A 02/23/2019   Procedure: DIALYSIS/PERMA CATHETER INSERTION;  Surgeon: Annice Needy, MD;  Location: [facility];  Service: Cardiovascular;  Laterality: N/A;   DIALYSIS/PERMA CATHETER REMOVAL N/A 04/03/2020   Procedure: DIALYSIS/PERMA CATHETER REMOVAL;  Surgeon: Renford Dills, MD;  Location: [facility];  Service: Cardiovascular;  Laterality: N/A;   DIALYSIS/PERMA CATHETER REMOVAL N/A 02/03/2022   Procedure: DIALYSIS/PERMA CATHETER REMOVAL;  Surgeon: Renford Dills, MD;  Location: [facility];  Service: Cardiovascular;  Laterality: N/A;  I & D EXTREMITY Left 10/11/2017   Procedure: IRRIGATION AND DEBRIDEMENT EXTREMITY;  Surgeon: Renford Dills, MD;  Location: [facility];  Service: Vascular;  Laterality: Left;   INCISION AND DRAINAGE ABSCESS Right 10/07/2017   Procedure: INCISION AND DRAINAGE ABSCESS;  Surgeon: Renford Dills, MD;  Location: [facility];  Service: Vascular;  Laterality: Right;   INCISION AND DRAINAGE OF WOUND Left 11/17/2022   Procedure: DEBRIDEMENT WOUND;  Surgeon: Renford Dills, MD;  Location: [facility];  Service: Vascular;  Laterality: Left;   INCISION AND DRAINAGE OF WOUND Left 11/24/2022   Procedure: DEBRIDEMENT WOUND LEFT LOWER EXTREMITY;  Surgeon: Renford Dills, MD;  Location: [facility];  Service: Vascular;  Laterality: Left;   IRRIGATION AND DEBRIDEMENT ABSCESS Left 10/03/2017   Procedure: IRRIGATION AND DEBRIDEMENT ABSCESS with debridement of skin, soft tissue, muscle 50sq cm;   Surgeon: Annice Needy, MD;  Location: [facility];  Service: General;  Laterality: Left;   LOWER EXTREMITY ANGIOGRAPHY Left 10/15/2022   Procedure: Lower Extremity Angiography;  Surgeon: Renford Dills, MD;  Location: [facility];  Service: Cardiovascular;  Laterality: Left;   TEMPORARY DIALYSIS CATHETER  02/20/2019   Procedure: TEMPORARY DIALYSIS CATHETER;  Surgeon: Renford Dills, MD;  Location: [facility];  Service: Cardiovascular;;   UPPER EXTREMITY ANGIOGRAPHY Left 09/18/2019   Procedure: UPPER EXTREMITY ANGIOGRAPHY;  Surgeon: Renford Dills, MD;  Location: [facility];  Service: Cardiovascular;  Laterality: Left;   WOUND DEBRIDEMENT Left 11/10/2022   Procedure: DEBRIDEMENT WOUND;  Surgeon: Renford Dills, MD;  Location: [facility];  Service: Vascular;  Laterality: Left;   WOUND DEBRIDEMENT Left 11/12/2022   Procedure: DEBRIDEMENT WOUND;  Surgeon: Renford Dills, MD;  Location: [facility];  Service: Vascular;  Laterality: Left;   WOUND DEBRIDEMENT Left 01/06/2023   Procedure: DEBRIDEMENT WOUND;  Surgeon: Renford Dills, MD;  Location: [facility];  Service: Vascular;  Laterality: Left;    Social History   Socioeconomic History   Marital status: Divorced    Spouse name: Not on file   Number of children: Not on file   Years of education: Not on file   Highest education level: Not on file  Occupational History   Not on file  Tobacco Use   Smoking status: Former    Types: Cigars    Passive exposure: Past   Smokeless tobacco: Former    Quit date: 09/30/2017   Tobacco comments:    occasional  Vaping Use   Vaping status: Never Used  Substance and Sexual Activity   Alcohol use: Yes    Alcohol/week: 1.0 standard drink of alcohol    Types: 1 Cans of beer per week    Comment: beer   Drug use: Not Currently   Sexual activity: Yes  Other Topics Concern   Not on file  Social History Narrative   Lives alone.  Significant other, Debbie.    Social  Determinants of Health   Financial Resource Strain: Not on file  Food Insecurity: No Food Insecurity (03/24/2023)   Hunger Vital Sign    Worried About Running Out of Food in the Last Year: Never true    Ran Out of Food in the Last Year: Never true  Transportation Needs: No Transportation Needs (03/24/2023)   PRAPARE - Administrator, Civil Service (Medical): No    Lack of Transportation (Non-Medical): No  Physical Activity: Not on file  Stress: Not on file  Social Connections:  Not on file  Intimate Partner Violence: Not At Risk (03/24/2023)   Humiliation, Afraid, Rape, and Kick questionnaire    Fear of Current or Ex-Partner: No    Emotionally Abused: No    Physically Abused: No    Sexually Abused: No    Family History  Problem Relation Age of Onset   Diabetes Father    Kidney disease Father    Diabetes Daughter    Allergies  Allergen Reactions   Lisinopril Swelling    Lips mouth    Furosemide Cough    Light headed, blurry vision, weakness.   Latex Itching   Adhesive [Tape] Itching and Rash   I? Current Facility-Administered Medications  Medication Dose Route Frequency Provider Last Rate Last Admin   acetaminophen (TYLENOL) tablet 650 mg  650 mg Oral Q6H PRN Lorretta Harp, MD   650 mg at 03/24/23 2004   albuterol (PROVENTIL) (2.5 MG/3ML) 0.083% nebulizer solution 3 mL  3 mL Inhalation Q4H PRN Lorretta Harp, MD       allopurinol (ZYLOPRIM) tablet 100 mg  100 mg Oral Daily Lorretta Harp, MD   100 mg at 03/25/23 4132   apixaban (ELIQUIS) tablet 5 mg  5 mg Oral BID Lorretta Harp, MD   5 mg at 03/25/23 0905   atorvastatin (LIPITOR) tablet 80 mg  80 mg Oral Marice Potter, MD   80 mg at 03/24/23 2103   calcium acetate (PHOSLO) capsule 1,334 mg  1,334 mg Oral PRN Sharen Hones, RPH       calcium acetate (PHOSLO) capsule 2,001 mg  2,001 mg Oral TID WC Lorretta Harp, MD   2,001 mg at 03/25/23 1216   cefTRIAXone (ROCEPHIN) 2 g in sodium chloride 0.9 % 100 mL IVPB  2 g Intravenous Q24H  Charise Killian, MD       [START ON 03/26/2023] Chlorhexidine Gluconate Cloth 2 % PADS 6 each  6 each Topical Q0600 Mosetta Pigeon, MD       cinacalcet (SENSIPAR) tablet 30 mg  30 mg Oral Q breakfast Lorretta Harp, MD   30 mg at 03/25/23 4401   clopidogrel (PLAVIX) tablet 75 mg  75 mg Oral Daily Lorretta Harp, MD   75 mg at 03/25/23 0272   dextromethorphan-guaiFENesin (MUCINEX DM) 30-600 MG per 12 hr tablet 1 tablet  1 tablet Oral BID PRN Lorretta Harp, MD       Melene Muller ON 03/26/2023] epoetin alfa (EPOGEN) injection 4,000 Units  4,000 Units Intravenous Q T,Th,Sa-HD Mosetta Pigeon, MD       hydrALAZINE (APRESOLINE) injection 5 mg  5 mg Intravenous Q2H PRN Lorretta Harp, MD       insulin aspart (novoLOG) injection 0-5 Units  0-5 Units Subcutaneous QHS Lorretta Harp, MD       insulin aspart (novoLOG) injection 0-9 Units  0-9 Units Subcutaneous TID WC Lorretta Harp, MD   1 Units at 03/25/23 1216   insulin glargine-yfgn (SEMGLEE) injection 15 Units  15 Units Subcutaneous QHS Lorretta Harp, MD   15 Units at 03/24/23 2147   multivitamin (RENA-VIT) tablet 1 tablet  1 tablet Oral Once per day on Monday Wednesday Friday Lorretta Harp, MD   1 tablet at 03/25/23 0906   ondansetron [facility]) injection 4 mg  4 mg Intravenous Q8H PRN Lorretta Harp, MD       oxyCODONE (Oxy IR/ROXICODONE) immediate release tablet 5 mg  5 mg Oral Q6H PRN Lorretta Harp, MD       traZODone (DESYREL) tablet 50 mg  50 mg Oral QHS Lorretta Harp, MD   50 mg at 03/24/23 2147     Abtx:  Anti-infectives (From admission, onward)    Start     Dose/Rate Route Frequency Ordered Stop   03/26/23 1200  vancomycin (VANCOCIN) IVPB 1000 mg/200 mL premix  Status:  Discontinued        1,000 mg 200 mL/hr over 60 Minutes Intravenous Every T-Th-Sa (Hemodialysis) 03/24/23 1755 03/25/23 1211   03/25/23 2200  ceFEPIme (MAXIPIME) 1 g in sodium chloride 0.9 % 100 mL IVPB  Status:  Discontinued        1 g 200 mL/hr over 30 Minutes Intravenous Every 24 hours 03/24/23 1755 03/25/23 1211    03/25/23 2000  cefTRIAXone (ROCEPHIN) 2 g in sodium chloride 0.9 % 100 mL IVPB        2 g 200 mL/hr over 30 Minutes Intravenous Every 24 hours 03/25/23 1212     03/24/23 1845  vancomycin (VANCOREADY) IVPB 1500 mg/300 mL        1,500 mg 150 mL/hr over 120 Minutes Intravenous  Once 03/24/23 1755 03/24/23 2043   03/24/23 0945  ceFEPIme (MAXIPIME) 2 g in sodium chloride 0.9 % 100 mL IVPB        2 g 200 mL/hr over 30 Minutes Intravenous  Once 03/24/23 0943 03/24/23 1013   03/24/23 0945  metroNIDAZOLE (FLAGYL) IVPB 500 mg        500 mg 100 mL/hr over 60 Minutes Intravenous  Once 03/24/23 0943 03/24/23 1115   03/24/23 0945  vancomycin (VANCOCIN) IVPB 1000 mg/200 mL premix        1,000 mg 200 mL/hr over 60 Minutes Intravenous  Once 03/24/23 0943 03/24/23 1323       REVIEW OF SYSTEMS:  Const:  fever, chills, negative weight loss Eyes: negative diplopia or visual changes, negative eye pain ENT: negative coryza, negative sore throat Resp: negative cough, hemoptysis, dyspnea Cards: negative for chest pain, palpitations, bilateral lower extremity edema GU: Does not make urine GI: Negative for abdominal pain, diarrhea, bleeding, constipation Skin: negative for rash and pruritus Heme: negative for easy bruising and gum/nose bleeding MS: General weak Neurol :negative for headaches, dizziness, vertigo, memory problems  Psych: negative for feelings of anxiety, depression  Endocrine: diabetes Allergy/Immunology- negative for any medication or food allergies ? Pertinent Positives include : Objective:  VITALS:  BP (!) 103/58 (BP Location: Right Wrist)   Pulse 70   Temp 99 F (37.2 C) (Oral)   Resp 18   Ht 6\' 4"  (1.93 m)   Wt (!) 175.4 kg   SpO2 96%   BMI 47.07 kg/m   PHYSICAL EXAM:  General: Alert, cooperative, no distress, appears stated age.  Morbid obesity Head: Normocephalic, without obvious abnormality, atraumatic. Eyes: Mild conjunctival injection  ENT Nares normal. No drainage  or sinus tenderness. Lips, mucosa, and tongue normal. No Thrush Neck: Supple, symmetrical, no adenopathy, thyroid: non tender no carotid bruit and no JVD. Lungs: Clear to auscultation bilaterally. No Wheezing or Rhonchi. No rales. Heart: Regular rate and rhythm, no murmur, rub or gallop. Abdomen: Soft, non-tender,not distended. Bowel sounds normal. No masses Extremities: Left AVG Bilateral lymphedema of the legs Left leg more swollen than the right There is a square shaped superficial wound with clean red tissue     Skin: As above Lymph: Cervical, supraclavicular normal. Neurologic: Grossly non-focal Pertinent Labs Lab Results CBC    Component Value Date/Time   WBC 15.9 (H) 03/25/2023 0615   RBC 3.58 (L)  03/25/2023 0615   HGB 10.7 (L) 03/25/2023 0615   HCT 33.2 (L) 03/25/2023 0615   PLT 127 (L) 03/25/2023 0615   MCV 92.7 03/25/2023 0615   MCH 29.9 03/25/2023 0615   MCHC 32.2 03/25/2023 0615   RDW 19.3 (H) 03/25/2023 0615   LYMPHSABS 0.3 (L) 03/24/2023 0941   MONOABS 0.2 03/24/2023 0941   EOSABS 0.1 03/24/2023 0941   BASOSABS 0.0 03/24/2023 0941       Latest Ref Rng & Units 03/25/2023    6:15 AM 03/24/2023    9:41 AM 02/04/2023    4:40 PM  CMP  Glucose 70 - 99 mg/dL 161  096  045   BUN 6 - 20 mg/dL 61  43  40   Creatinine 0.61 - 1.24 mg/dL 4.09  8.11  9.14   Sodium 135 - 145 mmol/L 136  135  142   Potassium 3.5 - 5.1 mmol/L 4.0  3.6  4.2   Chloride 98 - 111 mmol/L 98  97  102   CO2 22 - 32 mmol/L 25  27  28    Calcium 8.9 - 10.3 mg/dL 8.9  9.1  78.2   Total Protein 6.5 - 8.1 g/dL  9.1  8.3   Total Bilirubin 0.3 - 1.2 mg/dL  0.9  0.7   Alkaline Phos 38 - 126 U/L  56  56   AST 15 - 41 U/L  18  17   ALT 0 - 44 U/L  15  10       Microbiology: Recent Results (from the past 240 hour(s))  Culture, blood (Routine x 2)     Status: None (Preliminary result)   Collection Time: 03/24/23  9:41 AM   Specimen: BLOOD  Result Value Ref Range Status   Specimen Description  BLOOD BLOOD RIGHT ARM  Final   Special Requests   Final    BOTTLES DRAWN AEROBIC AND ANAEROBIC Blood Culture results may not be optimal due to an excessive volume of blood received in culture bottles   Culture  Setup Time   Final    Organism ID to follow GRAM POSITIVE COCCI IN BOTH AEROBIC AND ANAEROBIC BOTTLES CRITICAL RESULT CALLED TO, READ BACK BY AND VERIFIED WITH: NATHAN BELEU AT 2141 ON 03/24/23 BY SS Performed at [facility], [address] [place], [place] [address]    Culture GRAM POSITIVE COCCI  Final   Report Status PENDING  Incomplete  Blood Culture ID Panel (Reflexed)     Status: Abnormal   Collection Time: 03/24/23  9:41 AM  Result Value Ref Range Status   Enterococcus faecalis NOT DETECTED NOT DETECTED Final   Enterococcus Faecium NOT DETECTED NOT DETECTED Final   Listeria monocytogenes NOT DETECTED NOT DETECTED Final   Staphylococcus species NOT DETECTED NOT DETECTED Final   Staphylococcus aureus (BCID) NOT DETECTED NOT DETECTED Final   Staphylococcus epidermidis NOT DETECTED NOT DETECTED Final   Staphylococcus lugdunensis NOT DETECTED NOT DETECTED Final   Streptococcus species DETECTED (A) NOT DETECTED Final    Comment: Not Enterococcus species, Streptococcus agalactiae, Streptococcus pyogenes, or Streptococcus pneumoniae. CRITICAL RESULT CALLED TO, READ BACK BY AND VERIFIED WITH: NATHAN BELUE AT 2141 ON 03/24/23 BY SS    Streptococcus agalactiae NOT DETECTED NOT DETECTED Final   Streptococcus pneumoniae NOT DETECTED NOT DETECTED Final   Streptococcus pyogenes NOT DETECTED NOT DETECTED Final   A.calcoaceticus-baumannii NOT DETECTED NOT DETECTED Final   Bacteroides fragilis NOT DETECTED NOT DETECTED Final   Enterobacterales NOT DETECTED NOT  DETECTED Final   Enterobacter cloacae complex NOT DETECTED NOT DETECTED Final   Escherichia coli NOT DETECTED NOT DETECTED Final   Klebsiella aerogenes NOT DETECTED NOT DETECTED Final   Klebsiella oxytoca NOT DETECTED  NOT DETECTED Final   Klebsiella pneumoniae NOT DETECTED NOT DETECTED Final   Proteus species NOT DETECTED NOT DETECTED Final   Salmonella species NOT DETECTED NOT DETECTED Final   Serratia marcescens NOT DETECTED NOT DETECTED Final   Haemophilus influenzae NOT DETECTED NOT DETECTED Final   Neisseria meningitidis NOT DETECTED NOT DETECTED Final   Pseudomonas aeruginosa NOT DETECTED NOT DETECTED Final   Stenotrophomonas maltophilia NOT DETECTED NOT DETECTED Final   Candida albicans NOT DETECTED NOT DETECTED Final   Candida auris NOT DETECTED NOT DETECTED Final   Candida glabrata NOT DETECTED NOT DETECTED Final   Candida krusei NOT DETECTED NOT DETECTED Final   Candida parapsilosis NOT DETECTED NOT DETECTED Final   Candida tropicalis NOT DETECTED NOT DETECTED Final   Cryptococcus neoformans/gattii NOT DETECTED NOT DETECTED Final    Comment: Performed at [facility], [address] [place], [place] [address]  Culture, blood (Routine x 2)     Status: None (Preliminary result)   Collection Time: 03/24/23  9:55 AM   Specimen: BLOOD RIGHT ARM  Result Value Ref Range Status   Specimen Description BLOOD RIGHT ARM  Final   Special Requests   Final    BOTTLES DRAWN AEROBIC AND ANAEROBIC Blood Culture adequate volume   Culture  Setup Time   Final    GRAM POSITIVE COCCI IN BOTH AEROBIC AND ANAEROBIC BOTTLES CRITICAL VALUE NOTED.  VALUE IS CONSISTENT WITH PREVIOUSLY REPORTED AND CALLED VALUE. Performed at [facility], [address] [place], [place] [address]    Culture [facility] POSITIVE COCCI  Final   Report Status PENDING  Incomplete  Resp panel by RT-PCR (RSV, Flu A&B, Covid) Anterior Nasal Swab     Status: None   Collection Time: 03/24/23  9:55 AM   Specimen: Anterior Nasal Swab  Result Value Ref Range Status   SARS Coronavirus 2 by RT PCR NEGATIVE NEGATIVE Final    Comment: (NOTE) SARS-CoV-2 target nucleic acids are NOT DETECTED.  The SARS-CoV-2 RNA is generally  detectable in upper respiratory specimens during the acute phase of infection. The lowest concentration of SARS-CoV-2 viral copies this assay can detect is 138 copies/mL. A negative result does not preclude SARS-Cov-2 infection and should not be used as the sole basis for treatment or other patient management decisions. A negative result may occur with  improper specimen collection/handling, submission of specimen other than nasopharyngeal swab, presence of viral mutation(s) within the areas targeted by this assay, and inadequate number of viral copies(<138 copies/mL). A negative result must be combined with clinical observations, patient history, and epidemiological information. The expected result is Negative.  Fact Sheet for Patients:  BloggerCourse.com  Fact Sheet for Healthcare Providers:  SeriousBroker.it  This test is no t yet approved or cleared by the [place] FDA and  has been authorized for detection and/or diagnosis of SARS-CoV-2 by FDA under an Emergency Use Authorization (EUA). This EUA will remain  in effect (meaning this test can be used) for the duration of the COVID-19 declaration under Section 564(b)(1) of the Act, 21 U.S.C.section 360bbb-3(b)(1), unless the authorization is terminated  or revoked sooner.       Influenza A by PCR NEGATIVE NEGATIVE Final   Influenza B by PCR NEGATIVE NEGATIVE Final    Comment: (NOTE)  The Xpert Xpress SARS-CoV-2/FLU/RSV plus assay is intended as an aid in the diagnosis of influenza from Nasopharyngeal swab specimens and should not be used as a sole basis for treatment. Nasal washings and aspirates are unacceptable for Xpert Xpress SARS-CoV-2/FLU/RSV testing.  Fact Sheet for Patients: BloggerCourse.com  Fact Sheet for Healthcare Providers: SeriousBroker.it  This test is not yet approved or cleared by the [place] FDA  and has been authorized for detection and/or diagnosis of SARS-CoV-2 by FDA under an Emergency Use Authorization (EUA). This EUA will remain in effect (meaning this test can be used) for the duration of the COVID-19 declaration under Section 564(b)(1) of the Act, 21 U.S.C. section 360bbb-3(b)(1), unless the authorization is terminated or revoked.     Resp Syncytial Virus by PCR NEGATIVE NEGATIVE Final    Comment: (NOTE) Fact Sheet for Patients: BloggerCourse.com  Fact Sheet for Healthcare Providers: SeriousBroker.it  This test is not yet approved or cleared by the [place] FDA and has been authorized for detection and/or diagnosis of SARS-CoV-2 by FDA under an Emergency Use Authorization (EUA). This EUA will remain in effect (meaning this test can be used) for the duration of the COVID-19 declaration under Section 564(b)(1) of the Act, 21 U.S.C. section 360bbb-3(b)(1), unless the authorization is terminated or revoked.  Performed at [facility], [address] [place], [place] [address]     IMAGING RESULTS:   Chest x-ray cardiomegaly with mild pulmonary congestion X-ray foot and leg no osteomyelitis I have personally reviewed the films ? Impression/Recommendation Streptococcus bacteremia 4 out of 4 Source could be the left leg but patient is on the dialysis  sent a culture from the wound Will get 2D echo Will repeat blood cultures tomorrow Continue ceftriaxone Vancomycin been discontinued   Bilateral lymphedema of the legs  End-stage renal disease on dialysis  Diabetes mellitus management as per primary team  Hypertension management as per primary team ? Morbid obesity _  __________________________________________________ Discussed with patient, requesting provider [facility] on call this weekend.  Available by phone for urgent issues  Note:  This document was prepared using Dragon voice  recognition software and may include unintentional dictation errors.

## 2023-03-25 NOTE — Progress Notes (Signed)
PROGRESS NOTE    [name]  ZOX:[id number] DOB: 1970-11-16 DOA: 03/24/2023 PCP: Marina Goodell, MD   Assessment & Plan:   Principal Problem:   Sepsis [facility]) Active Problems:   Leg wound, left   Hypertension   End stage renal disease (HCC)   Type 1 diabetes mellitus (HCC)   Gout   HLD (hyperlipidemia)   COPD (chronic obstructive pulmonary disease) (HCC)   PVD (peripheral vascular disease) (HCC)   Obesity, Class III, BMI 40-49.9 (morbid obesity) (HCC)  Assessment and Plan: Sepsis: met criteria w/ fever, tachypnea, leukocytosis & possible left leg wound. Continue on IV cefepime, vanco. Procal 1.13  Possible bacteremia: blood cxs growing gram positive cocci, sens pending. Abxs changed to IV rocephin. ID consulted   Left leg wound: wound care consulted. Continue on IV abxs    HTN: holding home dose of amlodipine, atenolol    ESRD: on HD TTS. Nephro following and recs apprec   DM2: poorly controlled, HbA1c 8.8. Continue on glargine, SSI w/ accuchecks.    Gout: continue on home dose of allopurinol    HLD: continue on statin  COPD: w/o exacerbation. Bronchodilators prn    PVD: continue on eliquis, plavix, statin    Morbid obesity: BMI 47.0. Complicates overall care & prognosis   Thrombocytopenia: etiology unclear. Will continue to monitor   ACD: likely secondary to ESRD. No need for a transfusion currently      DVT prophylaxis: eliquis  Code Status:  full  Family Communication:  Disposition Plan: likely d/c back home   Level of care: Med-Surg Status is: Inpatient Remains inpatient appropriate because: requiring IV abxs     Consultants:  ID Nephro   Procedures:   Antimicrobials: rocpehin    Subjective: Pt c/o fatigue   Objective: Vitals:   03/24/23 2134 03/24/23 2335 03/25/23 0421 03/25/23 0754  BP:  (!) 118/47 (!) 118/51 (!) 103/58  Pulse:  70 73 70  Resp:  18 20 18   Temp: 99.6 F (37.6 C) 99.3 F (37.4 C) 99.6 F (37.6 C) 99 F (37.2  C)  TempSrc: Oral  Oral Oral  SpO2:  97% 93% 96%  Weight:      Height:        Intake/Output Summary (Last 24 hours) at 03/25/2023 0852 Last data filed at 03/25/2023 0444 Gross per 24 hour  Intake 1141.93 ml  Output --  Net 1141.93 ml   Filed Weights   03/24/23 0931  Weight: (!) 175.4 kg    Examination:  General exam: Appears calm and comfortable. Morbidly obese  Respiratory system: decreased breath sounds b/l otherwise clear  Cardiovascular system: S1 & S2+. No rubs, gallops or clicks.  Gastrointestinal system: Abdomen is obese, soft and nontender. Normal bowel sounds heard. Central nervous system: Alert and oriented. Moves all extremities  Psychiatry: Judgement and insight appear normal. Flat mood and affect    Data Reviewed: I have personally reviewed following labs and imaging studies  CBC: Recent Labs  Lab 03/24/23 0941 03/25/23 0615  WBC 10.2 15.9*  NEUTROABS 9.6*  --   HGB 12.4* 10.7*  HCT 39.4 33.2*  MCV 93.6 92.7  PLT 163 127*   Basic Metabolic Panel: Recent Labs  Lab 03/24/23 0941 03/25/23 0615  NA 135 136  K 3.6 4.0  CL 97* 98  CO2 27 25  GLUCOSE 137* 116*  BUN 43* 61*  CREATININE 7.85* 9.98*  CALCIUM 9.1 8.9   GFR: Estimated Creatinine Clearance: 14.8 mL/min (A) (by C-G formula based  on SCr of 9.98 mg/dL (H)). Liver Function Tests: Recent Labs  Lab 03/24/23 0941  AST 18  ALT 15  ALKPHOS 56  BILITOT 0.9  PROT 9.1*  ALBUMIN 3.9   No results for input(s): "LIPASE", "AMYLASE" in the last 168 hours. No results for input(s): "AMMONIA" in the last 168 hours. Coagulation Profile: Recent Labs  Lab 03/24/23 0941  INR 1.0   Cardiac Enzymes: No results for input(s): "CKTOTAL", "CKMB", "CKMBINDEX", "TROPONINI" in the last 168 hours. BNP (last 3 results) No results for input(s): "PROBNP" in the last 8760 hours. HbA1C: No results for input(s): "HGBA1C" in the last 72 hours. CBG: Recent Labs  Lab 03/24/23 1243 03/24/23 1712  03/24/23 2120 03/25/23 0755  GLUCAP 134* 176* 140* 112*   Lipid Profile: No results for input(s): "CHOL", "HDL", "LDLCALC", "TRIG", "CHOLHDL", "LDLDIRECT" in the last 72 hours. Thyroid Function Tests: No results for input(s): "TSH", "T4TOTAL", "FREET4", "T3FREE", "THYROIDAB" in the last 72 hours. Anemia Panel: No results for input(s): "VITAMINB12", "FOLATE", "FERRITIN", "TIBC", "IRON", "RETICCTPCT" in the last 72 hours. Sepsis Labs: Recent Labs  Lab 03/24/23 0941 03/24/23 1229  PROCALCITON 1.13  --   LATICACIDVEN 1.9 1.5    Recent Results (from the past 240 hour(s))  Culture, blood (Routine x 2)     Status: None (Preliminary result)   Collection Time: 03/24/23  9:41 AM   Specimen: BLOOD  Result Value Ref Range Status   Specimen Description BLOOD BLOOD RIGHT ARM  Final   Special Requests   Final    BOTTLES DRAWN AEROBIC AND ANAEROBIC Blood Culture results may not be optimal due to an excessive volume of blood received in culture bottles   Culture  Setup Time   Final    Organism ID to follow GRAM POSITIVE COCCI IN BOTH AEROBIC AND ANAEROBIC BOTTLES CRITICAL RESULT CALLED TO, READ BACK BY AND VERIFIED WITH: NATHAN BELEU AT 2141 ON 03/24/23 BY SS Performed at [facility], [address] [place], [place] [address]    Culture GRAM POSITIVE COCCI  Final   Report Status PENDING  Incomplete  Blood Culture ID Panel (Reflexed)     Status: Abnormal   Collection Time: 03/24/23  9:41 AM  Result Value Ref Range Status   Enterococcus faecalis NOT DETECTED NOT DETECTED Final   Enterococcus Faecium NOT DETECTED NOT DETECTED Final   Listeria monocytogenes NOT DETECTED NOT DETECTED Final   Staphylococcus species NOT DETECTED NOT DETECTED Final   Staphylococcus aureus (BCID) NOT DETECTED NOT DETECTED Final   Staphylococcus epidermidis NOT DETECTED NOT DETECTED Final   Staphylococcus lugdunensis NOT DETECTED NOT DETECTED Final   Streptococcus species DETECTED (A) NOT DETECTED  Final    Comment: Not Enterococcus species, Streptococcus agalactiae, Streptococcus pyogenes, or Streptococcus pneumoniae. CRITICAL RESULT CALLED TO, READ BACK BY AND VERIFIED WITH: NATHAN BELUE AT 2141 ON 03/24/23 BY SS    Streptococcus agalactiae NOT DETECTED NOT DETECTED Final   Streptococcus pneumoniae NOT DETECTED NOT DETECTED Final   Streptococcus pyogenes NOT DETECTED NOT DETECTED Final   A.calcoaceticus-baumannii NOT DETECTED NOT DETECTED Final   Bacteroides fragilis NOT DETECTED NOT DETECTED Final   Enterobacterales NOT DETECTED NOT DETECTED Final   Enterobacter cloacae complex NOT DETECTED NOT DETECTED Final   Escherichia coli NOT DETECTED NOT DETECTED Final   Klebsiella aerogenes NOT DETECTED NOT DETECTED Final   Klebsiella oxytoca NOT DETECTED NOT DETECTED Final   Klebsiella pneumoniae NOT DETECTED NOT DETECTED Final   Proteus species NOT DETECTED NOT DETECTED Final  Salmonella species NOT DETECTED NOT DETECTED Final   Serratia marcescens NOT DETECTED NOT DETECTED Final   Haemophilus influenzae NOT DETECTED NOT DETECTED Final   Neisseria meningitidis NOT DETECTED NOT DETECTED Final   Pseudomonas aeruginosa NOT DETECTED NOT DETECTED Final   Stenotrophomonas maltophilia NOT DETECTED NOT DETECTED Final   Candida albicans NOT DETECTED NOT DETECTED Final   Candida auris NOT DETECTED NOT DETECTED Final   Candida glabrata NOT DETECTED NOT DETECTED Final   Candida krusei NOT DETECTED NOT DETECTED Final   Candida parapsilosis NOT DETECTED NOT DETECTED Final   Candida tropicalis NOT DETECTED NOT DETECTED Final   Cryptococcus neoformans/gattii NOT DETECTED NOT DETECTED Final    Comment: Performed at [facility], [address] [place], [place] [address]  Culture, blood (Routine x 2)     Status: None (Preliminary result)   Collection Time: 03/24/23  9:55 AM   Specimen: BLOOD RIGHT ARM  Result Value Ref Range Status   Specimen Description BLOOD RIGHT ARM  Final    Special Requests   Final    BOTTLES DRAWN AEROBIC AND ANAEROBIC Blood Culture adequate volume   Culture  Setup Time   Final    GRAM POSITIVE COCCI IN BOTH AEROBIC AND ANAEROBIC BOTTLES CRITICAL VALUE NOTED.  VALUE IS CONSISTENT WITH PREVIOUSLY REPORTED AND CALLED VALUE. Performed at [facility], [address] [place], [place] [address]    Culture [facility] POSITIVE COCCI  Final   Report Status PENDING  Incomplete  Resp panel by RT-PCR (RSV, Flu A&B, Covid) Anterior Nasal Swab     Status: None   Collection Time: 03/24/23  9:55 AM   Specimen: Anterior Nasal Swab  Result Value Ref Range Status   SARS Coronavirus 2 by RT PCR NEGATIVE NEGATIVE Final    Comment: (NOTE) SARS-CoV-2 target nucleic acids are NOT DETECTED.  The SARS-CoV-2 RNA is generally detectable in upper respiratory specimens during the acute phase of infection. The lowest concentration of SARS-CoV-2 viral copies this assay can detect is 138 copies/mL. A negative result does not preclude SARS-Cov-2 infection and should not be used as the sole basis for treatment or other patient management decisions. A negative result may occur with  improper specimen collection/handling, submission of specimen other than nasopharyngeal swab, presence of viral mutation(s) within the areas targeted by this assay, and inadequate number of viral copies(<138 copies/mL). A negative result must be combined with clinical observations, patient history, and epidemiological information. The expected result is Negative.  Fact Sheet for Patients:  BloggerCourse.com  Fact Sheet for Healthcare Providers:  SeriousBroker.it  This test is no t yet approved or cleared by the [place] FDA and  has been authorized for detection and/or diagnosis of SARS-CoV-2 by FDA under an Emergency Use Authorization (EUA). This EUA will remain  in effect (meaning this test can be used) for the duration of  the COVID-19 declaration under Section 564(b)(1) of the Act, 21 U.S.C.section 360bbb-3(b)(1), unless the authorization is terminated  or revoked sooner.       Influenza A by PCR NEGATIVE NEGATIVE Final   Influenza B by PCR NEGATIVE NEGATIVE Final    Comment: (NOTE) The Xpert Xpress SARS-CoV-2/FLU/RSV plus assay is intended as an aid in the diagnosis of influenza from Nasopharyngeal swab specimens and should not be used as a sole basis for treatment. Nasal washings and aspirates are unacceptable for Xpert Xpress SARS-CoV-2/FLU/RSV testing.  Fact Sheet for Patients: BloggerCourse.com  Fact Sheet for Healthcare Providers: SeriousBroker.it  This test is  not yet approved or cleared by the [place] and has been authorized for detection and/or diagnosis of SARS-CoV-2 by FDA under an Emergency Use Authorization (EUA). This EUA will remain in effect (meaning this test can be used) for the duration of the COVID-19 declaration under Section 564(b)(1) of the Act, 21 U.S.C. section 360bbb-3(b)(1), unless the authorization is terminated or revoked.     Resp Syncytial Virus by PCR NEGATIVE NEGATIVE Final    Comment: (NOTE) Fact Sheet for Patients: BloggerCourse.com  Fact Sheet for Healthcare Providers: SeriousBroker.it  This test is not yet approved or cleared by the [place] FDA and has been authorized for detection and/or diagnosis of SARS-CoV-2 by FDA under an Emergency Use Authorization (EUA). This EUA will remain in effect (meaning this test can be used) for the duration of the COVID-19 declaration under Section 564(b)(1) of the Act, 21 U.S.C. section 360bbb-3(b)(1), unless the authorization is terminated or revoked.  Performed at [facility], [address] [place], [place] [address]          Radiology Studies: DG Chest Portable 1 View  Result  Date: 03/24/2023 CLINICAL DATA:  Sepsis. EXAM: PORTABLE CHEST 1 VIEW COMPARISON:  None Available. FINDINGS: The heart is mildly enlarged. Mild pulmonary vascular congestion. No focal consolidation or large pleural effusion. No acute osseous abnormality. IMPRESSION: Mild cardiomegaly with mild pulmonary vascular congestion. No focal consolidation or pleural effusion. Electronically Signed   By: Larose Hires D.O.   On: 03/24/2023 10:46   DG Foot Complete Left  Result Date: 03/24/2023 CLINICAL DATA:  Evaluate for osteomyelitis. Patient complains tube wounds to the left foot. EXAM: LEFT FOOT - COMPLETE 3+ VIEW COMPARISON:  None Available. FINDINGS: There is no evidence of fracture or dislocation. There is no cortical erosion or periosteal reaction to suggest osteomyelitis. Marked soft tissue swelling of the foot and ankle. IMPRESSION: 1. No radiographic evidence of osteomyelitis. Evaluation of early osteomyelitis is limited on radiographs, if there is clinical concern further evaluation with MR examination is suggested. 2. Marked soft tissue swelling of the foot and ankle. Electronically Signed   By: Larose Hires D.O.   On: 03/24/2023 10:44        Scheduled Meds:  allopurinol  100 mg Oral Daily   apixaban  5 mg Oral BID   atorvastatin  80 mg Oral QHS   calcium acetate  2,001 mg Oral TID WC   cinacalcet  30 mg Oral Q breakfast   clopidogrel  75 mg Oral Daily   insulin aspart  0-5 Units Subcutaneous QHS   insulin aspart  0-9 Units Subcutaneous TID WC   insulin glargine-yfgn  15 Units Subcutaneous QHS   multivitamin  1 tablet Oral Once per day on Monday Wednesday Friday   traZODone  50 mg Oral QHS   Continuous Infusions:  sodium chloride 50 mL/hr at 03/25/23 0444   ceFEPime (MAXIPIME) IV     [START ON 03/26/2023] vancomycin       LOS: 1 day    Time spent: 35 mins     Charise Killian, MD [facility] Pager [phone]  If 7PM-7AM, please contact  night-coverage [email or web address] 03/25/2023, 8:52 AM

## 2023-03-25 NOTE — Progress Notes (Signed)
[facility], [place] 03/25/23  Subjective:   LOS: 1  Patient known to our practice from outpatient dialysis.  He dialyzes at [facility].   Last HD was on Thursday.  He reports feeling cold chills during dialysis.  Staff got concern therefore he was sent to the emergency room for evaluation where he was found to have a temperature of 102.6 This morning he has low-grade temperature of 99 He thought he had a place on his left thigh that was sore but this morning it is back to normal. He has a chronic wound on his left leg. His WBC count is elevated therefore he was admitted and placed on empiric antibiotics.  Blood cultures are positive for gram-positive cocci.  Objective:  Vital signs in last 24 hours:  Temp:  [99 F (37.2 C)-101.8 F (38.8 C)] 99 F (37.2 C) (07/26 0754) Pulse Rate:  [70-74] 70 (07/26 0754) Resp:  [18-20] 18 (07/26 0754) BP: (103-126)/(47-74) 103/58 (07/26 0754) SpO2:  [93 %-100 %] 96 % (07/26 0754)  Weight change:  Filed Weights   03/24/23 0931  Weight: (!) 175.4 kg    Intake/Output:    Intake/Output Summary (Last 24 hours) at 03/25/2023 1219 Last data filed at 03/25/2023 0444 Gross per 24 hour  Intake 1141.93 ml  Output --  Net 1141.93 ml     Physical Exam: General: Laying in the bed, no acute distress  HEENT Moist oral mucous membranes  Pulm/lungs Normal breathing effort on room air  CVS/Heart No rub or gallop  Abdomen:  Soft, nontender  Extremities: Left leg wound, wrapped  Neurologic: Alert, oriented  Skin: Warm, dry  Access: Left arm AV graft       Basic Metabolic Panel:  Recent Labs  Lab 03/24/23 0941 03/25/23 0615  NA 135 136  K 3.6 4.0  CL 97* 98  CO2 27 25  GLUCOSE 137* 116*  BUN 43* 61*  CREATININE 7.85* 9.98*  CALCIUM 9.1 8.9     CBC: Recent Labs  Lab 03/24/23 0941 03/25/23 0615  WBC 10.2 15.9*  NEUTROABS 9.6*  --   HGB 12.4* 10.7*  HCT 39.4 33.2*  MCV 93.6 92.7  PLT 163 127*       Lab Results  Component Value Date   HEPBSAG NON REACTIVE 11/11/2022      Microbiology:  Recent Results (from the past 240 hour(s))  Culture, blood (Routine x 2)     Status: None (Preliminary result)   Collection Time: 03/24/23  9:41 AM   Specimen: BLOOD  Result Value Ref Range Status   Specimen Description BLOOD BLOOD RIGHT ARM  Final   Special Requests   Final    BOTTLES DRAWN AEROBIC AND ANAEROBIC Blood Culture results may not be optimal due to an excessive volume of blood received in culture bottles   Culture  Setup Time   Final    Organism ID to follow GRAM POSITIVE COCCI IN BOTH AEROBIC AND ANAEROBIC BOTTLES CRITICAL RESULT CALLED TO, READ BACK BY AND VERIFIED WITH: NATHAN BELEU AT 2141 ON 03/24/23 BY SS Performed at [facility], [address] [place], [place] [address]    Culture [facility] POSITIVE COCCI  Final   Report Status PENDING  Incomplete  Blood Culture ID Panel (Reflexed)     Status: Abnormal   Collection Time: 03/24/23  9:41 AM  Result Value Ref Range Status   Enterococcus faecalis NOT DETECTED NOT DETECTED Final   Enterococcus Faecium NOT DETECTED NOT DETECTED Final  Listeria monocytogenes NOT DETECTED NOT DETECTED Final   Staphylococcus species NOT DETECTED NOT DETECTED Final   Staphylococcus aureus (BCID) NOT DETECTED NOT DETECTED Final   Staphylococcus epidermidis NOT DETECTED NOT DETECTED Final   Staphylococcus lugdunensis NOT DETECTED NOT DETECTED Final   Streptococcus species DETECTED (A) NOT DETECTED Final    Comment: Not Enterococcus species, Streptococcus agalactiae, Streptococcus pyogenes, or Streptococcus pneumoniae. CRITICAL RESULT CALLED TO, READ BACK BY AND VERIFIED WITH: NATHAN BELUE AT 2141 ON 03/24/23 BY SS    Streptococcus agalactiae NOT DETECTED NOT DETECTED Final   Streptococcus pneumoniae NOT DETECTED NOT DETECTED Final   Streptococcus pyogenes NOT DETECTED NOT DETECTED Final   A.calcoaceticus-baumannii NOT DETECTED  NOT DETECTED Final   Bacteroides fragilis NOT DETECTED NOT DETECTED Final   Enterobacterales NOT DETECTED NOT DETECTED Final   Enterobacter cloacae complex NOT DETECTED NOT DETECTED Final   Escherichia coli NOT DETECTED NOT DETECTED Final   Klebsiella aerogenes NOT DETECTED NOT DETECTED Final   Klebsiella oxytoca NOT DETECTED NOT DETECTED Final   Klebsiella pneumoniae NOT DETECTED NOT DETECTED Final   Proteus species NOT DETECTED NOT DETECTED Final   Salmonella species NOT DETECTED NOT DETECTED Final   Serratia marcescens NOT DETECTED NOT DETECTED Final   Haemophilus influenzae NOT DETECTED NOT DETECTED Final   Neisseria meningitidis NOT DETECTED NOT DETECTED Final   Pseudomonas aeruginosa NOT DETECTED NOT DETECTED Final   Stenotrophomonas maltophilia NOT DETECTED NOT DETECTED Final   Candida albicans NOT DETECTED NOT DETECTED Final   Candida auris NOT DETECTED NOT DETECTED Final   Candida glabrata NOT DETECTED NOT DETECTED Final   Candida krusei NOT DETECTED NOT DETECTED Final   Candida parapsilosis NOT DETECTED NOT DETECTED Final   Candida tropicalis NOT DETECTED NOT DETECTED Final   Cryptococcus neoformans/gattii NOT DETECTED NOT DETECTED Final    Comment: Performed at [facility], [address] [place], [place] [address]  Culture, blood (Routine x 2)     Status: None (Preliminary result)   Collection Time: 03/24/23  9:55 AM   Specimen: BLOOD RIGHT ARM  Result Value Ref Range Status   Specimen Description BLOOD RIGHT ARM  Final   Special Requests   Final    BOTTLES DRAWN AEROBIC AND ANAEROBIC Blood Culture adequate volume   Culture  Setup Time   Final    GRAM POSITIVE COCCI IN BOTH AEROBIC AND ANAEROBIC BOTTLES CRITICAL VALUE NOTED.  VALUE IS CONSISTENT WITH PREVIOUSLY REPORTED AND CALLED VALUE. Performed at [facility], [address] [place], [place] [address]    Culture [facility] POSITIVE COCCI  Final   Report Status PENDING  Incomplete  Resp panel by  RT-PCR (RSV, Flu A&B, Covid) Anterior Nasal Swab     Status: None   Collection Time: 03/24/23  9:55 AM   Specimen: Anterior Nasal Swab  Result Value Ref Range Status   SARS Coronavirus 2 by RT PCR NEGATIVE NEGATIVE Final    Comment: (NOTE) SARS-CoV-2 target nucleic acids are NOT DETECTED.  The SARS-CoV-2 RNA is generally detectable in upper respiratory specimens during the acute phase of infection. The lowest concentration of SARS-CoV-2 viral copies this assay can detect is 138 copies/mL. A negative result does not preclude SARS-Cov-2 infection and should not be used as the sole basis for treatment or other patient management decisions. A negative result may occur with  improper specimen collection/handling, submission of specimen other than nasopharyngeal swab, presence of viral mutation(s) within the areas targeted by this assay, and inadequate number of  viral copies(<138 copies/mL). A negative result must be combined with clinical observations, patient history, and epidemiological information. The expected result is Negative.  Fact Sheet for Patients:  BloggerCourse.com  Fact Sheet for Healthcare Providers:  SeriousBroker.it  This test is no t yet approved or cleared by the [place] FDA and  has been authorized for detection and/or diagnosis of SARS-CoV-2 by FDA under an Emergency Use Authorization (EUA). This EUA will remain  in effect (meaning this test can be used) for the duration of the COVID-19 declaration under Section 564(b)(1) of the Act, 21 U.S.C.section 360bbb-3(b)(1), unless the authorization is terminated  or revoked sooner.       Influenza A by PCR NEGATIVE NEGATIVE Final   Influenza B by PCR NEGATIVE NEGATIVE Final    Comment: (NOTE) The Xpert Xpress SARS-CoV-2/FLU/RSV plus assay is intended as an aid in the diagnosis of influenza from Nasopharyngeal swab specimens and should not be used as a sole basis  for treatment. Nasal washings and aspirates are unacceptable for Xpert Xpress SARS-CoV-2/FLU/RSV testing.  Fact Sheet for Patients: BloggerCourse.com  Fact Sheet for Healthcare Providers: SeriousBroker.it  This test is not yet approved or cleared by the [place] FDA and has been authorized for detection and/or diagnosis of SARS-CoV-2 by FDA under an Emergency Use Authorization (EUA). This EUA will remain in effect (meaning this test can be used) for the duration of the COVID-19 declaration under Section 564(b)(1) of the Act, 21 U.S.C. section 360bbb-3(b)(1), unless the authorization is terminated or revoked.     Resp Syncytial Virus by PCR NEGATIVE NEGATIVE Final    Comment: (NOTE) Fact Sheet for Patients: BloggerCourse.com  Fact Sheet for Healthcare Providers: SeriousBroker.it  This test is not yet approved or cleared by the [place] FDA and has been authorized for detection and/or diagnosis of SARS-CoV-2 by FDA under an Emergency Use Authorization (EUA). This EUA will remain in effect (meaning this test can be used) for the duration of the COVID-19 declaration under Section 564(b)(1) of the Act, 21 U.S.C. section 360bbb-3(b)(1), unless the authorization is terminated or revoked.  Performed at [facility], [address] [place], [place] [address]     Coagulation Studies: Recent Labs    03/24/23 0941  LABPROT 13.8  INR 1.0    Urinalysis: No results for input(s): "COLORURINE", "LABSPEC", "PHURINE", "GLUCOSEU", "HGBUR", "BILIRUBINUR", "KETONESUR", "PROTEINUR", "UROBILINOGEN", "NITRITE", "LEUKOCYTESUR" in the last 72 hours.  Invalid input(s): "APPERANCEUR"    Imaging: DG Chest Portable 1 View  Result Date: 03/24/2023 CLINICAL DATA:  Sepsis. EXAM: PORTABLE CHEST 1 VIEW COMPARISON:  None Available. FINDINGS: The heart is mildly enlarged. Mild  pulmonary vascular congestion. No focal consolidation or large pleural effusion. No acute osseous abnormality. IMPRESSION: Mild cardiomegaly with mild pulmonary vascular congestion. No focal consolidation or pleural effusion. Electronically Signed   By: Larose Hires D.O.   On: 03/24/2023 10:46   DG Foot Complete Left  Result Date: 03/24/2023 CLINICAL DATA:  Evaluate for osteomyelitis. Patient complains tube wounds to the left foot. EXAM: LEFT FOOT - COMPLETE 3+ VIEW COMPARISON:  None Available. FINDINGS: There is no evidence of fracture or dislocation. There is no cortical erosion or periosteal reaction to suggest osteomyelitis. Marked soft tissue swelling of the foot and ankle. IMPRESSION: 1. No radiographic evidence of osteomyelitis. Evaluation of early osteomyelitis is limited on radiographs, if there is clinical concern further evaluation with MR examination is suggested. 2. Marked soft tissue swelling of the foot and ankle. Electronically Signed   By: Leona Carry  Ahmed D.O.   On: 03/24/2023 10:44     Medications:    cefTRIAXone (ROCEPHIN)  IV      allopurinol  100 mg Oral Daily   apixaban  5 mg Oral BID   atorvastatin  80 mg Oral QHS   calcium acetate  2,001 mg Oral TID WC   cinacalcet  30 mg Oral Q breakfast   clopidogrel  75 mg Oral Daily   insulin aspart  0-5 Units Subcutaneous QHS   insulin aspart  0-9 Units Subcutaneous TID WC   insulin glargine-yfgn  15 Units Subcutaneous QHS   multivitamin  1 tablet Oral Once per day on Monday Wednesday Friday   traZODone  50 mg Oral QHS   acetaminophen, albuterol, calcium acetate, dextromethorphan-guaiFENesin, hydrALAZINE, ondansetron (ZOFRAN) IV, oxyCODONE  Assessment/ Plan:  52 y.o. male with  ESRD-HD (TTS), HTN. HJLD, DM, COPD, PVD on Eliquis, gout, obesity, chronic ulcer in left leg and foot  was admitted on 03/24/2023 for  Principal Problem:   Sepsis (HCC) Active Problems:   Hypertension   End stage renal disease (HCC)   Obesity, Class  III, BMI 40-49.9 (morbid obesity) (HCC)   Type 1 diabetes mellitus (HCC)   PVD (peripheral vascular disease) (HCC)   Gout   HLD (hyperlipidemia)   COPD (chronic obstructive pulmonary disease) (HCC)   Leg wound, left  Sepsis (HCC) [A41.9] Sepsis, due to unspecified organism, unspecified whether acute organ dysfunction present (HCC) [A41.9]  #. ESRD [facility]/TTS/TW 173 kg/ Left arm AVG Will arrange for dialysis on Saturday.  #. Anemia of CKD  Lab Results  Component Value Date   HGB 10.7 (L) 03/25/2023   Low dose EPO with HD  #. Secondary hyperparathyroidism of renal origin N 25.81   No results found for: "PTH" Lab Results  Component Value Date   PHOS 6.6 (H) 11/25/2022   Monitor calcium and phos level during this admission Continued on Cinacalcet and calcium acetate.   #. Diabetes type 2 with CKD Hgb A1c MFr Bld (%)  Date Value  09/27/2022 8.8 (H)    # Fever With bacteremia.  Unclear source but likely leg wound. Wound care is following. Patient has received vancomycin and is currently on ceftriaxone. Antibiotics as per hospitalist team. Consider echo for evaluation to r/o endocarditis    LOS: 1 [name] [facility] 7/26/202412:19 PM  [facility] [place], [place] [phone]

## 2023-03-25 NOTE — TOC Initial Note (Signed)
Transition of Care [facility]) - Initial/Assessment Note    Patient Details  Name: [name] MRN: [id number] Date of Birth: 01-09-1971  Transition of Care [facility]) CM/SW Contact:    Truddie Hidden, RN Phone Number: 03/25/2023, 3:39 PM  Clinical Narrative:                 RA completed  Spoke with patient at bedside Patient lives alone and is able to drive himself to appointments His mother or significant other will transport  him home at discharge. [facility] [place]. He is currently active with [facility] RN on [place] and Friday's. He is also a patient at the [facility] on Wednesdays.   Expected Discharge Plan: Home w Home Health Services     Patient Goals and CMS Choice            Expected Discharge Plan and Services                                              Prior Living Arrangements/Services                       Activities of Daily Living Home Assistive Devices/Equipment: None ADL Screening (condition at time of admission) Patient's cognitive ability adequate to safely complete daily activities?: Yes Is the patient deaf or have difficulty hearing?: No Does the patient have difficulty seeing, even when wearing glasses/contacts?: No Does the patient have difficulty concentrating, remembering, or making decisions?: No Patient able to express need for assistance with ADLs?: Yes Does the patient have difficulty dressing or bathing?: No Independently performs ADLs?: Yes (appropriate for developmental age) Does the patient have difficulty walking or climbing stairs?: No Weakness of Legs: Left Weakness of Arms/Hands: None  Permission Sought/Granted                  Emotional Assessment              Admission diagnosis:  Sepsis (HCC) [A41.9] Sepsis, due to unspecified organism, unspecified whether acute organ dysfunction present [facility]) [A41.9] Patient Active Problem List   Diagnosis  Date Noted   Sepsis (HCC) 03/24/2023   HLD (hyperlipidemia) 03/24/2023   HTN (hypertension) 03/24/2023   COPD (chronic obstructive pulmonary disease) (HCC) 03/24/2023   Leg wound, left 03/24/2023   Venous ulcer of ankle (HCC) 02/08/2023   Gangrene of lower extremity (HCC) 11/10/2022   Reactive airway disease 11/10/2022   Diabetic ulcer of lower leg (HCC) 09/27/2022   COVID-19 virus infection 09/27/2022   Low testosterone 02/01/2021   Screening for malignant neoplasm of prostate 01/28/2021   Hyperprolactinemia (HCC) 07/03/2020   Gout 08/16/2019   End-stage renal disease (HCC) 06/07/2019   Complication of vascular access for dialysis 06/07/2019   Diabetic retinopathy of both eyes (HCC) 04/12/2019   Obesity, Class III, BMI 40-49.9 (morbid obesity) (HCC) 04/12/2019   AVF (arteriovenous fistula) (HCC) 02/22/2019   Hyperkalemia 02/20/2019   End stage renal disease (HCC) 05/29/2018   Venous ulcer of ankle, left (HCC) 05/29/2018   Lymphedema 11/03/2017   Cellulitis of left lower extremity 10/01/2017   Chronic venous insufficiency 10/01/2017   Hypertensive disorder 10/01/2017   Hypertension 09/20/2017   Diabetes mellitus with hyperglycemia (HCC) 09/20/2017   Swelling of limb 09/20/2017   CKD (chronic kidney disease) stage 5,  GFR less than 15 ml/min (HCC) 08/30/2017   PVD (peripheral vascular disease) (HCC) 06/18/2014   Long-term insulin use (HCC) 06/13/2014   Microalbuminuria 03/12/2014   Type 1 diabetes mellitus (HCC) 08/31/2003   PCP:  Marina Goodell, MD Pharmacy:   [phone] [place], [place] - [address] [place] [address] Phone: [phone] Fax: [phone]     Social Determinants of Health (SDOH) Social History: SDOH Screenings   Food Insecurity: No Food Insecurity (03/24/2023)  Housing: Low Risk  (03/24/2023)  Transportation Needs: No Transportation Needs (03/24/2023)  Utilities: Not At Risk (03/24/2023)  Tobacco Use: Medium Risk (03/24/2023)    SDOH Interventions:     Readmission Risk Interventions     No data to display

## 2023-03-25 NOTE — Progress Notes (Signed)
[name], [name] [id number] Page 1 of 9 Visit Report for 03/23/2023 Arrival Information Details Patient Name: Date of Service: [name], [name] 03/23/2023 8:00 [name] Medical Record Number: [id number] Patient Account Number: [id number] Date of Birth/Sex: Treating RN: 07/23/1971 (52 y.o. [name]) [name] Primary Care Joelle Flessner: [name] Other Clinician: Referring Tosha Belgarde: Treating Elner Seifert/Extender: Geralynn Ochs in Treatment: 5 Visit Information History Since Last Visit Added or deleted any medications: No Patient Arrived: Ambulatory Any new allergies or adverse reactions: No Arrival Time: 08:03 Had [name] fall or experienced change in No Accompanied By: self activities of daily living that may affect Transfer Assistance: None risk of falls: Patient Identification Verified: Yes Signs or symptoms of abuse/neglect since last visito No Secondary Verification Process Completed: Yes Hospitalized since last visit: No Patient Requires Transmission-Based Precautions: No Implantable device outside of the clinic excluding No Patient Has Alerts: Yes cellular tissue based products placed in the center Patient Alerts: Patient on Blood Thinner since last visit: ABI R [place] TBI .61 10/11/22 Has Dressing in Place as Prescribed: Yes ABI L  TBI .33 10/11/22 Pain Present Now: No Electronic Signature(s) Signed: 03/25/2023 7:58:02 AM By: [name] RN Entered By: [name] on 03/23/2023 08:08:14 -------------------------------------------------------------------------------- Clinic Level of Care Assessment Details Patient Name: Date of Service: [name], [name] 03/23/2023 8:00 [name] Medical Record Number: [id number] Patient Account Number: [id number] Date of Birth/Sex: Treating RN: Oct 13, 1970 (52 y.o. [name] Primary Care Brier Firebaugh: [name] Other Clinician: Referring Kalysta Kneisley: Treating Aram Domzalski/Extender: Geralynn Ochs in Treatment: 5 Clinic Level of Care Assessment Items TOOL 1 Quantity Score []  - 0 Use when EandM and Procedure is performed on INITIAL visit ASSESSMENTS - Nursing Assessment / Reassessment []  - 0 General Physical Exam (combine w/ comprehensive assessment (listed just below) when performed on new pt. evals) []  - 0 Comprehensive Assessment (HX, ROS, Risk Assessments, Wounds Hx, etc.) [name], [name] ([id number]) [id number] Page 2 of 9 ASSESSMENTS - Wound and Skin Assessment / Reassessment []  - 0 Dermatologic / Skin Assessment (not related to wound area) ASSESSMENTS - Ostomy and/or Continence Assessment and Care []  - 0 Incontinence Assessment and Management []  - 0 Ostomy Care Assessment and Management (repouching, etc.) PROCESS - Coordination of Care []  - 0 Simple Patient / Family Education for ongoing care []  - 0 Complex (extensive) Patient / Family Education for ongoing care []  - 0 Staff obtains Chiropractor, Records, T Results / Process Orders est []  - 0 Staff telephones HHA, Nursing Homes / Clarify orders / etc []  - 0 Routine Transfer to another Facility (non-emergent condition) []  - 0 Routine Hospital Admission (non-emergent condition) []  - 0 New Admissions / Manufacturing engineer / Ordering NPWT Apligraf, etc. , []  - 0 Emergency Hospital Admission (emergent condition) PROCESS - Special Needs []  - 0 Pediatric / Minor Patient Management []  - 0 Isolation Patient Management []  - 0 Hearing / Language / Visual special needs []  - 0 Assessment of Community assistance (transportation, D/C planning, etc.) []  - 0 Additional assistance / Altered mentation []  - 0 Support Surface(s) Assessment (bed, cushion, seat, etc.) INTERVENTIONS - Miscellaneous []  - 0 External ear exam []  - 0 Patient Transfer (multiple staff / Nurse, adult / Similar devices) []  - 0 Simple Staple / Suture removal (25 or less) []  - 0 Complex  Staple / Suture removal (26 or more) []  - 0 Hypo/Hyperglycemic Management (do not check if billed separately) []  - 0  Ankle / Brachial Index (ABI) - do not check if billed separately Has the patient been seen at the hospital within the last three years: Yes Total Score: 0 Level Of Care: ____ Electronic Signature(s) Signed: 03/25/2023 7:58:02 AM By: [name] RN Entered By: [name] on 03/23/2023 09:03:36 -------------------------------------------------------------------------------- Lower Extremity Assessment Details Patient Name: Date of Service: [name], [name] 03/23/2023 8:00 [name] Medical Record Number: [id number] Patient Account Number: [id number] Date of Birth/Sex: Treating RN: [birth year] (52 y.o. [name] Primary Care Lei Dower: [name] Other Clinician: Referring Jaliana Medellin: Treating Kristinia Leavy/Extender: Geralynn Ochs in Treatment: 5 [name], [name] ([id number]) [id number] Page 3 of 9 Edema Assessment Assessed: [Left: No] [Right: No] Edema: [Left: Ye] [Right: s] Calf Left: Right: Point of Measurement: 40 cm From Medial Instep 48 cm Ankle Left: Right: Point of Measurement: 10 cm From Medial Instep 45 cm Vascular Assessment Pulses: Dorsalis Pedis Palpable: [Left:Yes] Electronic Signature(s) Signed: 03/25/2023 7:58:02 AM By: [name] RN Entered By: [name] on 03/23/2023 08:26:45 -------------------------------------------------------------------------------- Multi Wound Chart Details Patient Name: Date of Service: [name], [name] 03/23/2023 8:00 [name] Medical Record Number: [id number] Patient Account Number: [id number] Date of Birth/Sex: Treating RN: 04-19-1971 (52 y.o. [name] Primary Care Euleta Belson: [name] Other Clinician: Referring Miaisabella Bacorn: Treating Danyl [name]/Extender: Geralynn Ochs in Treatment: 5 Vital Signs Height(in): 76 Pulse(bpm):  69 Weight(lbs): 400 Blood Pressure(mmHg): 166/90 Body Mass Index(BMI): 48.7 Temperature(F): 98.2 Respiratory Rate(breaths/min): 18 [21:Photos:] [N/[name]:N/[name]] Left, Anterior Foot Left Lower Leg N/[name] Wound Location: Gradually Appeared Gradually Appeared N/[name] Wounding Event: Diabetic Wound/Ulcer of the Lower Diabetic Wound/Ulcer of the Lower N/[name] Primary Etiology: Extremity Extremity Hypertension, Type II Diabetes, Hypertension, Type II Diabetes, N/[name] Comorbid History: Neuropathy Neuropathy 12/29/2022 08/30/2022 N/[name] Date Acquired: 5 5 N/[name] Weeks of Treatment: Open Open N/[name] Wound Status: VADIM, EASTIN [name] ([id number]) [id number] Page 4 of 9 No No N/[name] Wound Recurrence: 0.5x0.5x0.1 9x8x0.4 N/[name] Measurements L x W x D (cm) 0.196 56.549 N/[name] (cm) : rea 0.02 22.619 N/[name] Volume (cm) : 95.50% 37.70% N/[name] % Reduction in [name] rea: 98.50% 58.40% N/[name] % Reduction in Volume: Grade 1 Grade 2 N/[name] Classification: Medium Medium N/[name] Exudate [name] mount: Serosanguineous Serosanguineous N/[name] Exudate Type: red, brown red, brown N/[name] Exudate Color: Large (67-100%) Large (67-100%) N/[name] Granulation [name] mount: Red Red N/[name] Granulation Quality: Small (1-33%) None Present (0%) N/[name] Necrotic [name] mount: Fat Layer (Subcutaneous Tissue): Yes Fat Layer (Subcutaneous Tissue): Yes N/[name] Exposed Structures: Fascia: No Fascia: No Tendon: No Tendon: No Muscle: No Muscle: No Joint: No Joint: No Bone: No Bone: No None None N/[name] Epithelialization: Treatment Notes Electronic Signature(s) Signed: 03/25/2023 7:58:02 AM By: [name] RN Entered By: [name] on 03/23/2023 08:26:49 -------------------------------------------------------------------------------- Multi-Disciplinary Care Plan Details Patient Name: Date of Service: [name], [name] 03/23/2023 8:00 [name] Medical Record Number: [id number] Patient Account Number: [id number] Date of Birth/Sex: Treating RN: Nov 01, 1970 (52 y.o. [name] Primary Care Tyqwan Pink: [name] Other Clinician: Referring Miakoda Mcmillion: Treating Susanna Benge/Extender: Geralynn Ochs in Treatment: 5 Active Inactive Wound/Skin Impairment Nursing Diagnoses: Knowledge deficit related to ulceration/compromised skin integrity Goals: Patient/caregiver will verbalize understanding of skin care regimen Date Initiated: 03/09/2023 Target Resolution Date: 04/09/2023 Goal Status: Active Ulcer/skin breakdown will have [name] volume reduction of 30% by week 4 Date Initiated: 03/09/2023 Target Resolution Date: 04/09/2023 Goal Status: Active Ulcer/skin breakdown will have [name] volume reduction of 50% by week 8 Date  Initiated: 03/09/2023 Target Resolution Date: 05/10/2023 Goal Status: Active Ulcer/skin breakdown will have [name] volume reduction of 80% by week 12 Date Initiated: 03/09/2023 Target Resolution Date: 06/09/2023 Goal Status: Active Ulcer/skin breakdown will heal within 14 weeks Date Initiated: 03/09/2023 Target Resolution Date: 07/10/2023 Goal Status: Active Interventions: [name], [name] ([id number]) [id number] Page 5 of 9 Assess patient/caregiver ability to obtain necessary supplies Assess patient/caregiver ability to perform ulcer/skin care regimen upon admission and as needed Assess ulceration(s) every visit Notes: Electronic Signature(s) Signed: 03/25/2023 7:58:02 AM By: [name] RN Entered By: [name] on 03/23/2023 08:26:59 -------------------------------------------------------------------------------- Pain Assessment Details Patient Name: Date of Service: [name], [name] 03/23/2023 8:00 [name] Medical Record Number: [id number] Patient Account Number: [id number] Date of Birth/Sex: Treating RN: [birth year] (52 y.o. [name] Primary Care Terrius Gentile: [name] Other Clinician: Referring Marcell Pfeifer: Treating Adaeze Better/Extender: Geralynn Ochs in Treatment:  5 Active Problems Location of Pain Severity and Description of Pain Patient Has Paino No Site Locations Pain Management and Medication Current Pain Management: Electronic Signature(s) Signed: 03/25/2023 7:58:02 AM By: [name] RN Entered By: [name] on 03/23/2023 08:08:42 [name], [name] ([id number]) [id number] Page 6 of 9 -------------------------------------------------------------------------------- Patient/Caregiver Education Details Patient Name: Date of Service: [name], [name] [place] 7/24/2024andnbsp8:00 [name] Medical Record Number: [id number] Patient Account Number: [id number] Date of Birth/Gender: Treating RN: 05-10-1971 (52 y.o. [name]) [name] Primary Care Physician: [name] Other Clinician: Referring Physician: Treating Physician/Extender: Geralynn Ochs in Treatment: 5 Education Assessment Education Provided To: Patient Education Topics Provided Wound/Skin Impairment: Handouts: Caring for Your Ulcer Methods: Explain/Verbal Responses: State content correctly Electronic Signature(s) Signed: 03/25/2023 7:58:02 AM By: [name] RN Entered By: [name] on 03/23/2023 08:27:09 -------------------------------------------------------------------------------- Wound Assessment Details Patient Name: Date of Service: [name], [name] 03/23/2023 8:00 [name] Medical Record Number: [id number] Patient Account Number: [id number] Date of Birth/Sex: Treating RN: 22-Apr-1971 (52 y.o. [name] Primary Care Chudney Scheffler: [name] Other Clinician: Referring Jamaury Gumz: Treating Danyel Griess/Extender: Geralynn Ochs in Treatment: 5 Wound Status Wound Number: 21 Primary Etiology: Diabetic Wound/Ulcer of the Lower Extremity Wound Location: Left, Anterior Foot Wound Status: Open Wounding Event: Gradually Appeared Comorbid History: Hypertension, Type II Diabetes, Neuropathy Date Acquired:  12/29/2022 Weeks Of Treatment: 5 Clustered Wound: No Photos [name], [name] ([id number]) [id number] Page 7 of 9 Wound Measurements Length: (cm) 0.5 Width: (cm) 0.5 Depth: (cm) 0.1 Area: (cm) 0.196 Volume: (cm) 0.02 % Reduction in Area: 95.5% % Reduction in Volume: 98.5% Epithelialization: None Tunneling: No Undermining: No Wound Description Classification: Grade 1 Exudate Amount: Medium Exudate Type: Serosanguineous Exudate Color: red, brown Foul Odor After Cleansing: No Slough/Fibrino Yes Wound Bed Granulation Amount: Large (67-100%) Exposed Structure Granulation Quality: Red Fascia Exposed: No Necrotic Amount: Small (1-33%) Fat Layer (Subcutaneous Tissue) Exposed: Yes Necrotic Quality: Adherent Slough Tendon Exposed: No Muscle Exposed: No Joint Exposed: No Bone Exposed: No Treatment Notes Wound #21 (Foot) Wound Laterality: Left, Anterior Cleanser Soap and Water Discharge Instruction: Gently cleanse wound with antibacterial soap, rinse and pat dry prior to dressing wounds Wound Cleanser Discharge Instruction: Wash your hands with soap and water. Remove old dressing, discard into plastic bag and place into trash. Cleanse the wound with Wound Cleanser prior to applying [name] clean dressing using gauze sponges, not tissues or cotton balls. Do not scrub or use excessive force. Pat dry using gauze sponges, not tissue or cotton balls. Peri-Wound Care Topical Primary Dressing  Silvercel Small 2x2 (in/in) Discharge Instruction: Apply Silvercel Small 2x2 (in/in) as instructed Secondary Dressing Kerlix 4.5 x 4.1 (in/yd) Discharge Instruction: from toes to knee Zetuvit Plus 4x4 (in/in) carboflex Discharge Instruction: over zetuvit layer Secured With Compression Wrap coban Discharge Instruction: from toes to knee Compression Stockings Add-Ons Electronic Signature(s) Signed: 03/25/2023 7:58:02 AM By: [name] RN [name], [name]  [id number] Page 8 of 9 Entered By: [name] on 03/23/2023 08:26:04 -------------------------------------------------------------------------------- Wound Assessment Details Patient Name: Date of Service: [name], [name] 03/23/2023 8:00 [name] Medical Record Number: [id number] Patient Account Number: [id number] Date of Birth/Sex: Treating RN: 11-08-1970 (52 y.o. [name]) [name] Primary Care Buster Schueller: [name] Other Clinician: Referring [name]: Treating Josely Moffat/Extender: Geralynn Ochs in Treatment: 5 Wound Status Wound Number: 22 Primary Etiology: Diabetic Wound/Ulcer of the Lower Extremity Wound Location: Left Lower Leg Wound Status: Open Wounding Event: Gradually Appeared Comorbid History: Hypertension, Type II Diabetes, Neuropathy Date Acquired: 08/30/2022 Weeks Of Treatment: 5 Clustered Wound: No Photos Wound Measurements Length: (cm) 9 Width: (cm) 8 Depth: (cm) 0.4 Area: (cm) 56.549 Volume: (cm) 22.619 % Reduction in Area: 37.7% % Reduction in Volume: 58.4% Epithelialization: None Tunneling: No Undermining: No Wound Description Classification: Grade 2 Exudate Amount: Medium Exudate Type: Serosanguineous Exudate Color: red, brown Foul Odor After Cleansing: No Slough/Fibrino No Wound Bed Granulation Amount: Large (67-100%) Exposed Structure Granulation Quality: Red Fascia Exposed: No Necrotic Amount: None Present (0%) Fat Layer (Subcutaneous Tissue) Exposed: Yes Tendon Exposed: No Muscle Exposed: No Joint Exposed: No Bone Exposed: No Treatment Notes Wound #22 (Lower Leg) Wound Laterality: Left Cleanser [name], [name] ([id number]) [id number] Page 9 of 9 Soap and Water Discharge Instruction: Gently cleanse wound with antibacterial soap, rinse and pat dry prior to dressing wounds Wound Cleanser Discharge Instruction: Wash your hands with soap and water. Remove old  dressing, discard into plastic bag and place into trash. Cleanse the wound with Wound Cleanser prior to applying [name] clean dressing using gauze sponges, not tissues or cotton balls. Do not scrub or use excessive force. Pat dry using gauze sponges, not tissue or cotton balls. Peri-Wound Care Topical Primary Dressing Gauze Discharge Instruction: moistened with Dakins Solution Secondary Dressing Kerlix 4.5 x 4.1 (in/yd) Discharge Instruction: from toes to knee Zetuvit Plus 4x4 (in/in) Secured With Compression Wrap coban Discharge Instruction: from toes to knee Compression Stockings Add-Ons Electronic Signature(s) Signed: 03/25/2023 7:58:02 AM By: [name] RN Entered By: [name] on 03/23/2023 08:26:23 -------------------------------------------------------------------------------- Vitals Details Patient Name: Date of Service: [name], [name] 03/23/2023 8:00 [name] Medical Record Number: [id number] Patient Account Number: [id number] Date of Birth/Sex: Treating RN: 27-Feb-1971 (52 y.o. [name]) [name] Primary Care Myshawn [place]ir[place]: [name] Other Clinician: Referring Nithila Sumners: Treating Garnie Borchardt/Extender: Geralynn Ochs in Treatment: 5 Vital Signs Time Taken: 08:08 Temperature (F): 98.2 Height (in): 76 Pulse (bpm): 69 Weight (lbs): 400 Respiratory Rate (breaths/min): 18 Body Mass Index (BMI): 48.7 Blood Pressure (mmHg): 166/90 Reference Range: 80 - 120 mg / dl Electronic Signature(s) Signed: 03/25/2023 7:58:02 AM By: [name] RN Entered By: [name] on 03/23/2023 08:08:34

## 2023-03-26 ENCOUNTER — Inpatient Hospital Stay
Admit: 2023-03-26 | Discharge: 2023-03-26 | Disposition: A | Discharge status: Home / Self Care | Payer: Medicare HMO | Attending: Infectious Diseases | Admitting: Infectious Diseases

## 2023-03-26 DIAGNOSIS — R7881 Bacteremia: Secondary | ICD-10-CM | POA: Diagnosis not present

## 2023-03-26 LAB — GLUCOSE, CAPILLARY
Glucose-Capillary: 127 mg/dL — ABNORMAL HIGH (ref 70–99)
Glucose-Capillary: 181 mg/dL — ABNORMAL HIGH (ref 70–99)
Glucose-Capillary: 146 mg/dL — ABNORMAL HIGH (ref 70–99)

## 2023-03-26 MED ORDER — NEPRO/CARBSTEADY PO LIQD
237.0000 mL | Freq: Two times a day (BID) | ORAL | Status: DC
  Administered 2023-03-26 – 2023-03-29 (×7): 237 mL via ORAL

## 2023-03-26 MED ORDER — EPOETIN ALFA 4000 UNIT/ML IJ SOLN
INTRAMUSCULAR | Status: AC
  Filled 2023-03-26: qty 1

## 2023-03-26 NOTE — Plan of Care (Signed)
The patient is stable. Waiting on blood culture result.  Problem: Education: Goal: Ability to describe self-care measures that may prevent or decrease complications (Diabetes Survival Skills Education) will improve Outcome: Progressing Goal: Individualized Educational Video(s) Outcome: Progressing   Problem: Coping: Goal: Ability to adjust to condition or change in health will improve Outcome: Progressing   Problem: Fluid Volume: Goal: Ability to maintain a balanced intake and output will improve Outcome: Progressing   Problem: Health Behavior/Discharge Planning: Goal: Ability to identify and utilize available resources and services will improve Outcome: Progressing Goal: Ability to manage health-related needs will improve Outcome: Progressing   Problem: Metabolic: Goal: Ability to maintain appropriate glucose levels will improve Outcome: Progressing   Problem: Nutritional: Goal: Maintenance of adequate nutrition will improve Outcome: Progressing Goal: Progress toward achieving an optimal weight will improve Outcome: Progressing   Problem: Skin Integrity: Goal: Risk for impaired skin integrity will decrease Outcome: Progressing   Problem: Tissue Perfusion: Goal: Adequacy of tissue perfusion will improve Outcome: Progressing   Problem: Education: Goal: Knowledge of General Education information will improve Description: Including pain rating scale, medication(s)/side effects and non-pharmacologic comfort measures Outcome: Progressing   Problem: Health Behavior/Discharge Planning: Goal: Ability to manage health-related needs will improve Outcome: Progressing   Problem: Clinical Measurements: Goal: Ability to maintain clinical measurements within normal limits will improve Outcome: Progressing Goal: Will remain free from infection Outcome: Progressing Goal: Diagnostic test results will improve Outcome: Progressing Goal: Respiratory complications will  improve Outcome: Progressing Goal: Cardiovascular complication will be avoided Outcome: Progressing   Problem: Activity: Goal: Risk for activity intolerance will decrease Outcome: Progressing   Problem: Nutrition: Goal: Adequate nutrition will be maintained Outcome: Progressing   Problem: Coping: Goal: Level of anxiety will decrease Outcome: Progressing   Problem: Elimination: Goal: Will not experience complications related to bowel motility Outcome: Progressing Goal: Will not experience complications related to urinary retention Outcome: Progressing   Problem: Pain Managment: Goal: General experience of comfort will improve Outcome: Progressing

## 2023-03-26 NOTE — Progress Notes (Signed)
[facility]  Dialysis Note   Subjective:   Seen and examined on hemodialysis treatment.  Tolerating 2 L of fluid removal.   HEMODIALYSIS FLOWSHEET:  Blood Flow Rate (mL/min): 400 mL/min Arterial Pressure (mmHg): -210 mmHg Venous Pressure (mmHg): 240 mmHg TMP (mmHg): 6 mmHg Ultrafiltration Rate (mL/min): 693 mL/min Dialysate Flow Rate (mL/min): 300 ml/min Dialysis Fluid Bolus: Normal Saline    Objective:  Vital signs in last 24 hours:  Temp:  [98.2 F (36.8 C)-98.9 F (37.2 C)] 98.3 F (36.8 C) (07/27 0756) Pulse Rate:  [63-81] 64 (07/27 1100) Resp:  [12-27] 19 (07/27 1100) BP: (111-133)/(55-72) 121/69 (07/27 1100) SpO2:  [93 %-100 %] 98 % (07/27 1100) Weight:  [173.8 kg] 173.8 kg (07/27 0756)  Weight change:  Filed Weights   03/24/23 0931 03/26/23 0756  Weight: (!) 175.4 kg (!) 173.8 kg    Intake/Output: I/O last 3 completed shifts: In: 644.6 [P.O.:120; I.V.:524.6] Out: -    Intake/Output this shift:  No intake/output data recorded.  Physical Exam: General: NAD,   Head: Normocephalic, atraumatic. Moist oral mucosal membranes  Eyes: Anicteric, PERRL  Neck: Supple, trachea midline  Lungs:  Clear to auscultation  Heart: Regular rate and rhythm  Abdomen:  Soft, nontender,   Extremities: 1+ peripheral edema.  Neurologic: Nonfocal, moving all four extremities  Skin: No lesions  Access:     Basic Metabolic Panel: Recent Labs  Lab 03/24/23 0941 03/25/23 0615 03/26/23 0506  NA 135 136 135  K 3.6 4.0 3.8  CL 97* 98 99  CO2 27 25 24   GLUCOSE 137* 116* 121*  BUN 43* 61* 76*  CREATININE 7.85* 9.98* 12.26*  CALCIUM 9.1 8.9 9.1    Liver Function Tests: Recent Labs  Lab 03/24/23 0941  AST 18  ALT 15  ALKPHOS 56  BILITOT 0.9  PROT 9.1*  ALBUMIN 3.9   No results for input(s): "LIPASE", "AMYLASE" in the last 168 hours. No results for input(s): "AMMONIA" in the last 168 hours.  CBC: Recent Labs  Lab 03/24/23 0941 03/25/23 0615  03/26/23 0506  WBC 10.2 15.9* 12.6*  NEUTROABS 9.6*  --   --   HGB 12.4* 10.7* 10.7*  HCT 39.4 33.2* 33.6*  MCV 93.6 92.7 93.3  PLT 163 127* 123*    Cardiac Enzymes: No results for input(s): "CKTOTAL", "CKMB", "CKMBINDEX", "TROPONINI" in the last 168 hours.  BNP: Invalid input(s): "POCBNP"  CBG: Recent Labs  Lab 03/24/23 2120 03/25/23 0755 03/25/23 1124 03/25/23 1701 03/25/23 2045  GLUCAP 140* 112* 125* 130* 118*    Microbiology: Results for orders placed or performed during the hospital encounter of 03/24/23  Culture, blood (Routine x 2)     Status: Abnormal (Preliminary result)   Collection Time: 03/24/23  9:41 AM   Specimen: BLOOD  Result Value Ref Range Status   Specimen Description   Final    BLOOD BLOOD RIGHT ARM Performed at [facility], [address] [place], [place] [address]    Special Requests   Final    BOTTLES DRAWN AEROBIC AND ANAEROBIC Blood Culture results may not be optimal due to an excessive volume of blood received in culture bottles Performed at [facility], [address] [place], [place] [address]    Culture  Setup Time   Final    Organism ID to follow GRAM POSITIVE COCCI IN BOTH AEROBIC AND ANAEROBIC BOTTLES CRITICAL RESULT CALLED TO, READ BACK BY AND VERIFIED WITH: NATHAN BELEU AT 2141 ON 03/24/23 BY SS Performed  at [facility], [address] [place], [place] [address]    Culture (A)  Final    STREPTOCOCCI, BETA HEMOLYTIC IDENTIFICATION AND SUSCEPTIBILITIES TO FOLLOW Performed at [facility], [address] [place], [place] [address]    Report Status PENDING  Incomplete  Blood Culture ID Panel (Reflexed)     Status: Abnormal   Collection Time: 03/24/23  9:41 AM  Result Value Ref Range Status   Enterococcus faecalis NOT DETECTED NOT DETECTED Final   Enterococcus Faecium NOT DETECTED NOT DETECTED Final   Listeria monocytogenes NOT DETECTED NOT DETECTED Final   Staphylococcus species NOT  DETECTED NOT DETECTED Final   Staphylococcus aureus (BCID) NOT DETECTED NOT DETECTED Final   Staphylococcus epidermidis NOT DETECTED NOT DETECTED Final   Staphylococcus lugdunensis NOT DETECTED NOT DETECTED Final   Streptococcus species DETECTED (A) NOT DETECTED Final    Comment: Not Enterococcus species, Streptococcus agalactiae, Streptococcus pyogenes, or Streptococcus pneumoniae. CRITICAL RESULT CALLED TO, READ BACK BY AND VERIFIED WITH: NATHAN BELUE AT 2141 ON 03/24/23 BY SS    Streptococcus agalactiae NOT DETECTED NOT DETECTED Final   Streptococcus pneumoniae NOT DETECTED NOT DETECTED Final   Streptococcus pyogenes NOT DETECTED NOT DETECTED Final   A.calcoaceticus-baumannii NOT DETECTED NOT DETECTED Final   Bacteroides fragilis NOT DETECTED NOT DETECTED Final   Enterobacterales NOT DETECTED NOT DETECTED Final   Enterobacter cloacae complex NOT DETECTED NOT DETECTED Final   Escherichia coli NOT DETECTED NOT DETECTED Final   Klebsiella aerogenes NOT DETECTED NOT DETECTED Final   Klebsiella oxytoca NOT DETECTED NOT DETECTED Final   Klebsiella pneumoniae NOT DETECTED NOT DETECTED Final   Proteus species NOT DETECTED NOT DETECTED Final   Salmonella species NOT DETECTED NOT DETECTED Final   Serratia marcescens NOT DETECTED NOT DETECTED Final   Haemophilus influenzae NOT DETECTED NOT DETECTED Final   Neisseria meningitidis NOT DETECTED NOT DETECTED Final   Pseudomonas aeruginosa NOT DETECTED NOT DETECTED Final   Stenotrophomonas maltophilia NOT DETECTED NOT DETECTED Final   Candida albicans NOT DETECTED NOT DETECTED Final   Candida auris NOT DETECTED NOT DETECTED Final   Candida glabrata NOT DETECTED NOT DETECTED Final   Candida krusei NOT DETECTED NOT DETECTED Final   Candida parapsilosis NOT DETECTED NOT DETECTED Final   Candida tropicalis NOT DETECTED NOT DETECTED Final   Cryptococcus neoformans/gattii NOT DETECTED NOT DETECTED Final    Comment: Performed at [facility],  [address] [place], [place] [address]  Culture, blood (Routine x 2)     Status: Abnormal (Preliminary result)   Collection Time: 03/24/23  9:55 AM   Specimen: BLOOD RIGHT ARM  Result Value Ref Range Status   Specimen Description   Final    BLOOD RIGHT ARM Performed at [facility], [address], [place], [place] [address]    Special Requests   Final    BOTTLES DRAWN AEROBIC AND ANAEROBIC Blood Culture adequate volume Performed at [facility], [address] [place], [place] [address]    Culture  Setup Time   Final    GRAM POSITIVE COCCI IN BOTH AEROBIC AND ANAEROBIC BOTTLES CRITICAL VALUE NOTED.  VALUE IS CONSISTENT WITH PREVIOUSLY REPORTED AND CALLED VALUE. Performed at [facility], [address] [place], [place] [address]    Culture (A)  Final    STREPTOCOCCI, BETA HEMOLYTIC IDENTIFICATION TO FOLLOW Performed at [facility], [address], [place], [place] [address]    Report Status PENDING  Incomplete  Resp panel by RT-PCR (RSV, Flu A&B, Covid) Anterior Nasal Swab     Status: None   Collection Time: 03/24/23  9:55 AM   Specimen: Anterior Nasal Swab  Result Value Ref Range Status   SARS Coronavirus 2 by RT PCR NEGATIVE NEGATIVE Final    Comment: (NOTE) SARS-CoV-2 target nucleic acids are NOT DETECTED.  The SARS-CoV-2 RNA is generally detectable in upper respiratory specimens during the acute phase of infection. The lowest concentration of SARS-CoV-2 viral copies this assay can detect is 138 copies/mL. A negative result does not preclude SARS-Cov-2 infection and should not be used as the sole basis for treatment or other patient management decisions. A negative result may occur with  improper specimen collection/handling, submission of specimen other than nasopharyngeal swab, presence of viral mutation(s) within the areas targeted by this assay, and inadequate number of viral copies(<138 copies/mL). A negative result must  be combined with clinical observations, patient history, and epidemiological information. The expected result is Negative.  Fact Sheet for Patients:  BloggerCourse.com  Fact Sheet for Healthcare Providers:  SeriousBroker.it  This test is no t yet approved or cleared by the [place] FDA and  has been authorized for detection and/or diagnosis of SARS-CoV-2 by FDA under an Emergency Use Authorization (EUA). This EUA will remain  in effect (meaning this test can be used) for the duration of the COVID-19 declaration under Section 564(b)(1) of the Act, 21 U.S.C.section 360bbb-3(b)(1), unless the authorization is terminated  or revoked sooner.       Influenza A by PCR NEGATIVE NEGATIVE Final   Influenza B by PCR NEGATIVE NEGATIVE Final    Comment: (NOTE) The Xpert Xpress SARS-CoV-2/FLU/RSV plus assay is intended as an aid in the diagnosis of influenza from Nasopharyngeal swab specimens and should not be used as a sole basis for treatment. Nasal washings and aspirates are unacceptable for Xpert Xpress SARS-CoV-2/FLU/RSV testing.  Fact Sheet for Patients: BloggerCourse.com  Fact Sheet for Healthcare Providers: SeriousBroker.it  This test is not yet approved or cleared by the [place] FDA and has been authorized for detection and/or diagnosis of SARS-CoV-2 by FDA under an Emergency Use Authorization (EUA). This EUA will remain in effect (meaning this test can be used) for the duration of the COVID-19 declaration under Section 564(b)(1) of the Act, 21 U.S.C. section 360bbb-3(b)(1), unless the authorization is terminated or revoked.     Resp Syncytial Virus by PCR NEGATIVE NEGATIVE Final    Comment: (NOTE) Fact Sheet for Patients: BloggerCourse.com  Fact Sheet for Healthcare Providers: SeriousBroker.it  This test is not  yet approved or cleared by the [place] FDA and has been authorized for detection and/or diagnosis of SARS-CoV-2 by FDA under an Emergency Use Authorization (EUA). This EUA will remain in effect (meaning this test can be used) for the duration of the COVID-19 declaration under Section 564(b)(1) of the Act, 21 U.S.C. section 360bbb-3(b)(1), unless the authorization is terminated or revoked.  Performed at [facility], [address], [place], [place] [address]   Aerobic Culture w Gram Stain (superficial specimen)     Status: None (Preliminary result)   Collection Time: 03/25/23  6:50 PM   Specimen: Wound  Result Value Ref Range Status   Specimen Description   Final    WOUND Performed at [facility], [address] [place], [place] [address]    Special Requests   Final    NONE Performed at [facility], [address] [place], [place] [address]  Gram Stain   Final    FEW WBC PRESENT, PREDOMINANTLY PMN RARE GRAM POSITIVE RODS RARE GRAM POSITIVE COCCI Performed at [facility], [address], [place], [place] [address]    Culture PENDING  Incomplete   Report Status PENDING  Incomplete    Coagulation Studies: Recent Labs    03/24/23 0941  LABPROT 13.8  INR 1.0    Urinalysis: No results for input(s): "COLORURINE", "LABSPEC", "PHURINE", "GLUCOSEU", "HGBUR", "BILIRUBINUR", "KETONESUR", "PROTEINUR", "UROBILINOGEN", "NITRITE", "LEUKOCYTESUR" in the last 72 hours.  Invalid input(s): "APPERANCEUR"    Imaging: No results found.   Medications:    cefTRIAXone (ROCEPHIN)  IV 2 g (03/25/23 2015)    allopurinol  100 mg Oral Daily   apixaban  5 mg Oral BID   atorvastatin  80 mg Oral QHS   calcium acetate  2,001 mg Oral TID WC   Chlorhexidine Gluconate Cloth  6 each Topical Q0600   cinacalcet  30 mg Oral Q breakfast   clopidogrel  75 mg Oral Daily   epoetin (EPOGEN/PROCRIT) injection  4,000 Units Intravenous Q T,Th,Sa-HD    insulin aspart  0-5 Units Subcutaneous QHS   insulin aspart  0-9 Units Subcutaneous TID WC   insulin glargine-yfgn  15 Units Subcutaneous QHS   multivitamin  1 tablet Oral Once per day on Monday Wednesday Friday   traZODone  50 mg Oral QHS   acetaminophen, albuterol, calcium acetate, dextromethorphan-guaiFENesin, hydrALAZINE, ondansetron (ZOFRAN) IV, oxyCODONE  Assessment/ Plan:  [name] is a 52 y.o.  male  with medical history significant of ESRD-HD (TTS), HTN. HJLD, DM, COPD, PVD on Eliquis, gout, obesity, chronic ulcer in left leg and foot, who presents with fever, chills, weakness.   Principal Problem:   Sepsis (HCC) Active Problems:   Hypertension   End stage renal disease (HCC)   Obesity, Class III, BMI 40-49.9 (morbid obesity) (HCC)   Type 1 diabetes mellitus (HCC)   PVD (peripheral vascular disease) (HCC)   Gout   HLD (hyperlipidemia)   COPD (chronic obstructive pulmonary disease) (HCC)   Leg wound, left     End Stage Renal Disease on hemodialysis: Continue stable dialysis treatment.  Attempt 2 L of fluid removal as tolerated.  Potassium [facility] vascular lab)  Date Value Ref Range Status  10/15/2022 4.4 3.5 - 5.1 mmol/L Final    Comment:    Performed at [facility], [address] [place], [place] [address]    Intake/Output Summary (Last 24 hours) at 03/26/2023 1111 Last data filed at 03/25/2023 1500 Gross per 24 hour  Intake 120 ml  Output --  Net 120 ml    2. Hypertension with chronic kidney disease: Blood pressure is well-controlled.   BP 121/69   Pulse 64   Temp 98.3 F (36.8 C)   Resp 19   Ht 6\' 4"  (1.93 m)   Wt (!) 173.8 kg   SpO2 98%   BMI 46.64 kg/m   3. Anemia of chronic kidney disease/ kidney injury/chronic disease/acute blood loss: Will monitor closely. Lab Results  Component Value Date   HGB 10.7 (L) 03/26/2023    4. Secondary Hyperparathyroidism: Will follow protocols.   Lab Results  Component Value Date    CALCIUM 9.1 03/26/2023   CAION 1.14 (L) 11/12/2022   PHOS 6.6 (H) 11/25/2022    5: Sepsis: Sepsis most likely secondary to leg wounds.  Now on Rocephin.  Will continue to follow.    LOS: 2 Lorain Childes, MD [facility] 7/27/202411:11 AM

## 2023-03-26 NOTE — Progress Notes (Signed)
Hemodialysis note  Received patient in bed to unit. Alert and oriented.  Informed consent signed and in chart.  Treatment initiated: 0817 Treatment completed: 1240  Patient tolerated well. Transported back to room, alert without acute distress.  Report given to patient's RN.   Access used: LAU AVG Access issues: none  Total UF removed: 2L Medication(s) given:  Epogen 4000 units IV  Post HD weight: 171.8 kg   Wolfgang Phoenix [facility]

## 2023-03-26 NOTE — Progress Notes (Signed)
PROGRESS NOTE    [name]  ZOX:[id number] DOB: 03-Nov-1970 DOA: 03/24/2023 PCP: Marina Goodell, MD   Assessment & Plan:   Principal Problem:   Sepsis [facility]) Active Problems:   Leg wound, left   Hypertension   End stage renal disease (HCC)   Type 1 diabetes mellitus (HCC)   Gout   HLD (hyperlipidemia)   COPD (chronic obstructive pulmonary disease) (HCC)   PVD (peripheral vascular disease) (HCC)   Obesity, Class III, BMI 40-49.9 (morbid obesity) (HCC)  Assessment and Plan: Sepsis: met criteria w/ fever, tachypnea, leukocytosis & possible left leg wound. Continue on IV rocephin as per ID. Sepsis resolved   Possible bacteremia: blood cxs growing strep group G, sens pending . Continue on IV rocephin as per ID. Echo ordered as per ID.   Left leg wound: continue w/ wound care. Continue on abxs    HTN: holding home dose of atenolol, amlodipine    ESRD: on HD TTS. Nephro following and recs apprec    DM2: HbA1c 8.8, poorly controlled. Continue on glargine, SSI w/ accuchecks    Gout: continue on home dose of allopurinol    HLD: continue on statin   COPD: w/o exacerbation. Bronchodilators prn    PVD: continue on plavix, statin, eliquis   Morbid obesity: BMI 47.0. Complicates overall care & prognosis   Thrombocytopenia: etiology unclear. Will continue to monitor   ACD: likely secondary to ESRD. Will transfuse if Hb < 7.0      DVT prophylaxis: eliquis  Code Status:  full  Family Communication:  Disposition Plan: likely d/c back home   Level of care: Med-Surg Status is: Inpatient Remains inpatient appropriate because: requiring IV abxs     Consultants:  ID Nephro   Procedures:   Antimicrobials: rocpehin    Subjective: Pt c/o malaise   Objective: Vitals:   03/25/23 0754 03/25/23 2046 03/26/23 0456 03/26/23 0756  BP: (!) 103/58 127/70 (!) 126/59 (!) 133/57  Pulse: 70 70 70 66  Resp: 18 20 18 12   Temp: 99 F (37.2 C) 98.9 F (37.2 C) 98.2 F  (36.8 C) 98.3 F (36.8 C)  TempSrc: Oral Oral Oral   SpO2: 96% 94% 93% 95%  Weight:    (!) 173.8 kg  Height:        Intake/Output Summary (Last 24 hours) at 03/26/2023 0817 Last data filed at 03/25/2023 1500 Gross per 24 hour  Intake 120 ml  Output --  Net 120 ml   Filed Weights   03/24/23 0931 03/26/23 0756  Weight: (!) 175.4 kg (!) 173.8 kg    Examination:  General exam:Appears comfortable. Morbidly obese Respiratory system: diminished breath sounds b/l otherwise clear  Cardiovascular system: S1/S2+. No rubs or clicks  Gastrointestinal system: Abd is soft, NT, obese & hypoactive bowel sounds  Central nervous system: Alert & oriented. Moves all extremities  Psychiatry: judgement and insight appears normal. Appropriate mood and affect     Data Reviewed: I have personally reviewed following labs and imaging studies  CBC: Recent Labs  Lab 03/24/23 0941 03/25/23 0615 03/26/23 0506  WBC 10.2 15.9* 12.6*  NEUTROABS 9.6*  --   --   HGB 12.4* 10.7* 10.7*  HCT 39.4 33.2* 33.6*  MCV 93.6 92.7 93.3  PLT 163 127* 123*   Basic Metabolic Panel: Recent Labs  Lab 03/24/23 0941 03/25/23 0615 03/26/23 0506  NA 135 136 135  K 3.6 4.0 3.8  CL 97* 98 99  CO2 27 25 24   GLUCOSE  137* 116* 121*  BUN 43* 61* 76*  CREATININE 7.85* 9.98* 12.26*  CALCIUM 9.1 8.9 9.1   GFR: Estimated Creatinine Clearance: 12 mL/min (A) (by C-G formula based on SCr of 12.26 mg/dL (H)). Liver Function Tests: Recent Labs  Lab 03/24/23 0941  AST 18  ALT 15  ALKPHOS 56  BILITOT 0.9  PROT 9.1*  ALBUMIN 3.9   No results for input(s): "LIPASE", "AMYLASE" in the last 168 hours. No results for input(s): "AMMONIA" in the last 168 hours. Coagulation Profile: Recent Labs  Lab 03/24/23 0941  INR 1.0   Cardiac Enzymes: No results for input(s): "CKTOTAL", "CKMB", "CKMBINDEX", "TROPONINI" in the last 168 hours. BNP (last 3 results) No results for input(s): "PROBNP" in the last 8760  hours. HbA1C: No results for input(s): "HGBA1C" in the last 72 hours. CBG: Recent Labs  Lab 03/24/23 2120 03/25/23 0755 03/25/23 1124 03/25/23 1701 03/25/23 2045  GLUCAP 140* 112* 125* 130* 118*   Lipid Profile: No results for input(s): "CHOL", "HDL", "LDLCALC", "TRIG", "CHOLHDL", "LDLDIRECT" in the last 72 hours. Thyroid Function Tests: No results for input(s): "TSH", "T4TOTAL", "FREET4", "T3FREE", "THYROIDAB" in the last 72 hours. Anemia Panel: No results for input(s): "VITAMINB12", "FOLATE", "FERRITIN", "TIBC", "IRON", "RETICCTPCT" in the last 72 hours. Sepsis Labs: Recent Labs  Lab 03/24/23 0941 03/24/23 1229  PROCALCITON 1.13  --   LATICACIDVEN 1.9 1.5    Recent Results (from the past 240 hour(s))  Culture, blood (Routine x 2)     Status: None (Preliminary result)   Collection Time: 03/24/23  9:41 AM   Specimen: BLOOD  Result Value Ref Range Status   Specimen Description BLOOD BLOOD RIGHT ARM  Final   Special Requests   Final    BOTTLES DRAWN AEROBIC AND ANAEROBIC Blood Culture results may not be optimal due to an excessive volume of blood received in culture bottles   Culture  Setup Time   Final    Organism ID to follow GRAM POSITIVE COCCI IN BOTH AEROBIC AND ANAEROBIC BOTTLES CRITICAL RESULT CALLED TO, READ BACK BY AND VERIFIED WITH: NATHAN BELEU AT 2141 ON 03/24/23 BY SS Performed at [facility], [address] [place], [place] [address]    Culture GRAM POSITIVE COCCI  Final   Report Status PENDING  Incomplete  Blood Culture ID Panel (Reflexed)     Status: Abnormal   Collection Time: 03/24/23  9:41 AM  Result Value Ref Range Status   Enterococcus faecalis NOT DETECTED NOT DETECTED Final   Enterococcus Faecium NOT DETECTED NOT DETECTED Final   Listeria monocytogenes NOT DETECTED NOT DETECTED Final   Staphylococcus species NOT DETECTED NOT DETECTED Final   Staphylococcus aureus (BCID) NOT DETECTED NOT DETECTED Final   Staphylococcus epidermidis  NOT DETECTED NOT DETECTED Final   Staphylococcus lugdunensis NOT DETECTED NOT DETECTED Final   Streptococcus species DETECTED (A) NOT DETECTED Final    Comment: Not Enterococcus species, Streptococcus agalactiae, Streptococcus pyogenes, or Streptococcus pneumoniae. CRITICAL RESULT CALLED TO, READ BACK BY AND VERIFIED WITH: NATHAN BELUE AT 2141 ON 03/24/23 BY SS    Streptococcus agalactiae NOT DETECTED NOT DETECTED Final   Streptococcus pneumoniae NOT DETECTED NOT DETECTED Final   Streptococcus pyogenes NOT DETECTED NOT DETECTED Final   A.calcoaceticus-baumannii NOT DETECTED NOT DETECTED Final   Bacteroides fragilis NOT DETECTED NOT DETECTED Final   Enterobacterales NOT DETECTED NOT DETECTED Final   Enterobacter cloacae complex NOT DETECTED NOT DETECTED Final   Escherichia coli NOT DETECTED NOT DETECTED Final   Klebsiella aerogenes  NOT DETECTED NOT DETECTED Final   Klebsiella oxytoca NOT DETECTED NOT DETECTED Final   Klebsiella pneumoniae NOT DETECTED NOT DETECTED Final   Proteus species NOT DETECTED NOT DETECTED Final   Salmonella species NOT DETECTED NOT DETECTED Final   Serratia marcescens NOT DETECTED NOT DETECTED Final   Haemophilus influenzae NOT DETECTED NOT DETECTED Final   Neisseria meningitidis NOT DETECTED NOT DETECTED Final   Pseudomonas aeruginosa NOT DETECTED NOT DETECTED Final   Stenotrophomonas maltophilia NOT DETECTED NOT DETECTED Final   Candida albicans NOT DETECTED NOT DETECTED Final   Candida auris NOT DETECTED NOT DETECTED Final   Candida glabrata NOT DETECTED NOT DETECTED Final   Candida krusei NOT DETECTED NOT DETECTED Final   Candida parapsilosis NOT DETECTED NOT DETECTED Final   Candida tropicalis NOT DETECTED NOT DETECTED Final   Cryptococcus neoformans/gattii NOT DETECTED NOT DETECTED Final    Comment: Performed at [facility], [address] [place], [place] [address]  Culture, blood (Routine x 2)     Status: None (Preliminary result)    Collection Time: 03/24/23  9:55 AM   Specimen: BLOOD RIGHT ARM  Result Value Ref Range Status   Specimen Description BLOOD RIGHT ARM  Final   Special Requests   Final    BOTTLES DRAWN AEROBIC AND ANAEROBIC Blood Culture adequate volume   Culture  Setup Time   Final    GRAM POSITIVE COCCI IN BOTH AEROBIC AND ANAEROBIC BOTTLES CRITICAL VALUE NOTED.  VALUE IS CONSISTENT WITH PREVIOUSLY REPORTED AND CALLED VALUE. Performed at [facility], [address] [place], [place] [address]    Culture [facility] POSITIVE COCCI  Final   Report Status PENDING  Incomplete  Resp panel by RT-PCR (RSV, Flu A&B, Covid) Anterior Nasal Swab     Status: None   Collection Time: 03/24/23  9:55 AM   Specimen: Anterior Nasal Swab  Result Value Ref Range Status   SARS Coronavirus 2 by RT PCR NEGATIVE NEGATIVE Final    Comment: (NOTE) SARS-CoV-2 target nucleic acids are NOT DETECTED.  The SARS-CoV-2 RNA is generally detectable in upper respiratory specimens during the acute phase of infection. The lowest concentration of SARS-CoV-2 viral copies this assay can detect is 138 copies/mL. A negative result does not preclude SARS-Cov-2 infection and should not be used as the sole basis for treatment or other patient management decisions. A negative result may occur with  improper specimen collection/handling, submission of specimen other than nasopharyngeal swab, presence of viral mutation(s) within the areas targeted by this assay, and inadequate number of viral copies(<138 copies/mL). A negative result must be combined with clinical observations, patient history, and epidemiological information. The expected result is Negative.  Fact Sheet for Patients:  BloggerCourse.com  Fact Sheet for Healthcare Providers:  SeriousBroker.it  This test is no t yet approved or cleared by the [place] FDA and  has been authorized for detection and/or diagnosis of  SARS-CoV-2 by FDA under an Emergency Use Authorization (EUA). This EUA will remain  in effect (meaning this test can be used) for the duration of the COVID-19 declaration under Section 564(b)(1) of the Act, 21 U.S.C.section 360bbb-3(b)(1), unless the authorization is terminated  or revoked sooner.       Influenza A by PCR NEGATIVE NEGATIVE Final   Influenza B by PCR NEGATIVE NEGATIVE Final    Comment: (NOTE) The Xpert Xpress SARS-CoV-2/FLU/RSV plus assay is intended as an aid in the diagnosis of influenza from Nasopharyngeal swab specimens and should not be used  as a sole basis for treatment. Nasal washings and aspirates are unacceptable for Xpert Xpress SARS-CoV-2/FLU/RSV testing.  Fact Sheet for Patients: BloggerCourse.com  Fact Sheet for Healthcare Providers: SeriousBroker.it  This test is not yet approved or cleared by the [place] FDA and has been authorized for detection and/or diagnosis of SARS-CoV-2 by FDA under an Emergency Use Authorization (EUA). This EUA will remain in effect (meaning this test can be used) for the duration of the COVID-19 declaration under Section 564(b)(1) of the Act, 21 U.S.C. section 360bbb-3(b)(1), unless the authorization is terminated or revoked.     Resp Syncytial Virus by PCR NEGATIVE NEGATIVE Final    Comment: (NOTE) Fact Sheet for Patients: BloggerCourse.com  Fact Sheet for Healthcare Providers: SeriousBroker.it  This test is not yet approved or cleared by the [place] FDA and has been authorized for detection and/or diagnosis of SARS-CoV-2 by FDA under an Emergency Use Authorization (EUA). This EUA will remain in effect (meaning this test can be used) for the duration of the COVID-19 declaration under Section 564(b)(1) of the Act, 21 U.S.C. section 360bbb-3(b)(1), unless the authorization is terminated  or revoked.  Performed at [facility], [address] [place], [place] [address]   Aerobic Culture w Gram Stain (superficial specimen)     Status: None (Preliminary result)   Collection Time: 03/25/23  6:50 PM   Specimen: Wound  Result Value Ref Range Status   Specimen Description   Final    WOUND Performed at [facility], [address], [place], [place] [address]    Special Requests   Final    NONE Performed at [facility], [address] [place], [place] [address]    Gram Stain   Final    FEW WBC PRESENT, PREDOMINANTLY PMN RARE GRAM POSITIVE RODS RARE GRAM POSITIVE COCCI Performed at [facility], [address] [place], [place] [address]    Culture PENDING  Incomplete   Report Status PENDING  Incomplete         Radiology Studies: DG Chest Portable 1 View  Result Date: 03/24/2023 CLINICAL DATA:  Sepsis. EXAM: PORTABLE CHEST 1 VIEW COMPARISON:  None Available. FINDINGS: The heart is mildly enlarged. Mild pulmonary vascular congestion. No focal consolidation or large pleural effusion. No acute osseous abnormality. IMPRESSION: Mild cardiomegaly with mild pulmonary vascular congestion. No focal consolidation or pleural effusion. Electronically Signed   By: Larose Hires D.O.   On: 03/24/2023 10:46   DG Foot Complete Left  Result Date: 03/24/2023 CLINICAL DATA:  Evaluate for osteomyelitis. Patient complains tube wounds to the left foot. EXAM: LEFT FOOT - COMPLETE 3+ VIEW COMPARISON:  None Available. FINDINGS: There is no evidence of fracture or dislocation. There is no cortical erosion or periosteal reaction to suggest osteomyelitis. Marked soft tissue swelling of the foot and ankle. IMPRESSION: 1. No radiographic evidence of osteomyelitis. Evaluation of early osteomyelitis is limited on radiographs, if there is clinical concern further evaluation with MR examination is suggested. 2. Marked soft tissue swelling of the foot and ankle.  Electronically Signed   By: Larose Hires D.O.   On: 03/24/2023 10:44        Scheduled Meds:  allopurinol  100 mg Oral Daily   apixaban  5 mg Oral BID   atorvastatin  80 mg Oral QHS   calcium acetate  2,001 mg Oral TID WC   Chlorhexidine Gluconate Cloth  6 each Topical Q0600   cinacalcet  30 mg Oral Q breakfast  clopidogrel  75 mg Oral Daily   epoetin (EPOGEN/PROCRIT) injection  4,000 Units Intravenous Q T,Th,Sa-HD   insulin aspart  0-5 Units Subcutaneous QHS   insulin aspart  0-9 Units Subcutaneous TID WC   insulin glargine-yfgn  15 Units Subcutaneous QHS   multivitamin  1 tablet Oral Once per day on Monday Wednesday Friday   traZODone  50 mg Oral QHS   Continuous Infusions:  cefTRIAXone (ROCEPHIN)  IV 2 g (03/25/23 2015)     LOS: 2 days    Time spent: 35 mins     Charise Killian, MD [facility] Pager [phone]  If 7PM-7AM, please contact night-coverage [email or web address] 03/26/2023, 8:17 AM

## 2023-03-27 ENCOUNTER — Inpatient Hospital Stay (HOSPITAL_COMMUNITY)
Admit: 2023-03-27 | Discharge: 2023-03-27 | Disposition: A | Discharge status: Home / Self Care | Payer: Medicare HMO | Attending: Infectious Diseases | Admitting: Infectious Diseases

## 2023-03-27 DIAGNOSIS — A408 Other streptococcal sepsis: Secondary | ICD-10-CM | POA: Diagnosis not present

## 2023-03-27 DIAGNOSIS — R7881 Bacteremia: Secondary | ICD-10-CM

## 2023-03-27 LAB — CBC
HCT: 33.9 % — ABNORMAL LOW (ref 39.0–52.0)
Hemoglobin: 11.2 g/dL — ABNORMAL LOW (ref 13.0–17.0)
MCH: 30.1 pg (ref 26.0–34.0)
MCHC: 33 g/dL (ref 30.0–36.0)
MCV: 91.1 fL (ref 80.0–100.0)
Platelets: 126 10*3/uL — ABNORMAL LOW (ref 150–400)
RBC: 3.72 MIL/uL — ABNORMAL LOW (ref 4.22–5.81)
RDW: 19 % — ABNORMAL HIGH (ref 11.5–15.5)
WBC: 9.9 10*3/uL (ref 4.0–10.5)
nRBC: 0 % (ref 0.0–0.2)

## 2023-03-27 LAB — BASIC METABOLIC PANEL WITH GFR
Anion gap: 10 (ref 5–15)
BUN: 56 mg/dL — ABNORMAL HIGH (ref 6–20)
CO2: 27 mmol/L (ref 22–32)
Calcium: 9.4 mg/dL (ref 8.9–10.3)
Chloride: 99 mmol/L (ref 98–111)
Creatinine, Ser: 9.58 mg/dL — ABNORMAL HIGH (ref 0.61–1.24)
GFR, Estimated: 6 mL/min — ABNORMAL LOW (ref >60)
Glucose, Bld: 138 mg/dL — ABNORMAL HIGH (ref 70–99)
Potassium: 3.9 mmol/L (ref 3.5–5.1)
Sodium: 136 mmol/L (ref 135–145)

## 2023-03-27 LAB — GLUCOSE, CAPILLARY
Glucose-Capillary: 122 mg/dL — ABNORMAL HIGH (ref 70–99)
Glucose-Capillary: 165 mg/dL — ABNORMAL HIGH (ref 70–99)
Glucose-Capillary: 104 mg/dL — ABNORMAL HIGH (ref 70–99)
Glucose-Capillary: 157 mg/dL — ABNORMAL HIGH (ref 70–99)

## 2023-03-27 MED ORDER — SODIUM CHLORIDE 0.9 % IV SOLN: INTRAVENOUS | Status: DC | PRN

## 2023-03-27 MED ORDER — AMLODIPINE BESYLATE 5 MG PO TABS
5.0000 mg | ORAL_TABLET | Freq: Every day | ORAL | Status: DC
  Administered 2023-03-27 – 2023-03-29 (×3): 5 mg via ORAL
  Filled 2023-03-27 (×3): qty 1

## 2023-03-27 MED ORDER — ORAL CARE MOUTH RINSE: 15.0000 mL | OROMUCOSAL | Status: DC | PRN

## 2023-03-27 NOTE — Plan of Care (Signed)

## 2023-03-27 NOTE — Progress Notes (Signed)
*  PRELIMINARY RESULTS* Echocardiogram 2D Echocardiogram has been performed.  [name] 03/27/2023, 9:37 AM

## 2023-03-27 NOTE — Progress Notes (Signed)
PROGRESS NOTE    [name]  XBJ:[id number] DOB: 1970/10/29 DOA: 03/24/2023 PCP: Marina Goodell, MD   Assessment & Plan:   Principal Problem:   Sepsis [facility]) Active Problems:   Leg wound, left   Hypertension   End stage renal disease (HCC)   Type 1 diabetes mellitus (HCC)   Gout   HLD (hyperlipidemia)   COPD (chronic obstructive pulmonary disease) (HCC)   PVD (peripheral vascular disease) (HCC)   Obesity, Class III, BMI 40-49.9 (morbid obesity) (HCC)  Assessment and Plan: Sepsis: met criteria w/ fever, tachypnea, leukocytosis & possible left leg wound. Continue on IV rocephin as per ID. Sepsis resolved   Possible bacteremia: blood cx growing group G strept . Continue on IV rocephin as per ID. Echo shows EF 60-65%, grade II diastolic dysfunction, no regional wall motion abnormalities & no evidence of valvular vegetations.   Left leg wound: continue w/ wound care. Continue on abxs    HTN: will restart home dose of amlodipine. Holding home atenolol    ESRD: on HD TTS. Nephro following and recs apprec    DM2: poorly controlled, HbA1c 8.8. Continue on glargine, SSI w/ accuchecks    Gout: continue on home dose of allopurinol    HLD: continue on statin    COPD: w/o exacerbation. Bronchodilators prn. Encourage incentive spirometry    PVD: continue on eliquis, plavix, statin   Morbid obesity: BMI 47.0. Complicates overall care & prognosis   Thrombocytopenia: etiology unclear. Labile   ACD: likely secondary to ESRD. Will transfuse if Hb < 7.0      DVT prophylaxis: eliquis  Code Status:  full  Family Communication:  Disposition Plan: likely d/c back home   Level of care: Med-Surg Status is: Inpatient Remains inpatient appropriate because: requiring IV abxs     Consultants:  ID Nephro   Procedures:   Antimicrobials: rocpehin    Subjective: Pt c/o fatigue   Objective: Vitals:   03/26/23 1336 03/26/23 1531 03/26/23 2015 03/27/23 0441  BP: (!)  120/39 (!) 147/98 126/68 135/65  Pulse: 64 65 66 72  Resp: 16 18 16 16   Temp: 98.4 F (36.9 C) 98.3 F (36.8 C) 98.6 F (37 C) 98.4 F (36.9 C)  TempSrc: Oral Oral Oral Oral  SpO2: 98% 95% 97% 94%  Weight:      Height:        Intake/Output Summary (Last 24 hours) at 03/27/2023 0719 Last data filed at 03/26/2023 1923 Gross per 24 hour  Intake 460 ml  Output 2000 ml  Net -1540 ml   Filed Weights   03/24/23 0931 03/26/23 0756 03/26/23 1225  Weight: (!) 175.4 kg (!) 173.8 kg (!) 171.8 kg    Examination:  General exam: Appears calm & comfortable. Morbidly obese  Respiratory system: decreased breath sounds b/l otherwise clear  Cardiovascular system: S1 & S2+. No rubs or clicks  Gastrointestinal system: Abd is soft, NT, obese & hypoactive bowel sounds  Central nervous system: Alert & oriented. Moves all extremities  Psychiatry: judgement and insight appears normal. Appropriate mood and affect     Data Reviewed: I have personally reviewed following labs and imaging studies  CBC: Recent Labs  Lab 03/24/23 0941 03/25/23 0615 03/26/23 0506 03/27/23 0443  WBC 10.2 15.9* 12.6* 9.9  NEUTROABS 9.6*  --   --   --   HGB 12.4* 10.7* 10.7* 11.2*  HCT 39.4 33.2* 33.6* 33.9*  MCV 93.6 92.7 93.3 91.1  PLT 163 127* 123* 126*  Basic Metabolic Panel: Recent Labs  Lab 03/24/23 0941 03/25/23 0615 03/26/23 0506 03/27/23 0443  NA 135 136 135 136  K 3.6 4.0 3.8 3.9  CL 97* 98 99 99  CO2 27 25 24 27   GLUCOSE 137* 116* 121* 138*  BUN 43* 61* 76* 56*  CREATININE 7.85* 9.98* 12.26* 9.58*  CALCIUM 9.1 8.9 9.1 9.4   GFR: Estimated Creatinine Clearance: 15.2 mL/min (A) (by C-G formula based on SCr of 9.58 mg/dL (H)). Liver Function Tests: Recent Labs  Lab 03/24/23 0941  AST 18  ALT 15  ALKPHOS 56  BILITOT 0.9  PROT 9.1*  ALBUMIN 3.9   No results for input(s): "LIPASE", "AMYLASE" in the last 168 hours. No results for input(s): "AMMONIA" in the last 168 hours. Coagulation  Profile: Recent Labs  Lab 03/24/23 0941  INR 1.0   Cardiac Enzymes: No results for input(s): "CKTOTAL", "CKMB", "CKMBINDEX", "TROPONINI" in the last 168 hours. BNP (last 3 results) No results for input(s): "PROBNP" in the last 8760 hours. HbA1C: No results for input(s): "HGBA1C" in the last 72 hours. CBG: Recent Labs  Lab 03/25/23 1701 03/25/23 2045 03/26/23 1334 03/26/23 1655 03/26/23 2115  GLUCAP 130* 118* 127* 181* 146*   Lipid Profile: No results for input(s): "CHOL", "HDL", "LDLCALC", "TRIG", "CHOLHDL", "LDLDIRECT" in the last 72 hours. Thyroid Function Tests: No results for input(s): "TSH", "T4TOTAL", "FREET4", "T3FREE", "THYROIDAB" in the last 72 hours. Anemia Panel: No results for input(s): "VITAMINB12", "FOLATE", "FERRITIN", "TIBC", "IRON", "RETICCTPCT" in the last 72 hours. Sepsis Labs: Recent Labs  Lab 03/24/23 0941 03/24/23 1229  PROCALCITON 1.13  --   LATICACIDVEN 1.9 1.5    Recent Results (from the past 240 hour(s))  Culture, blood (Routine x 2)     Status: Abnormal (Preliminary result)   Collection Time: 03/24/23  9:41 AM   Specimen: BLOOD  Result Value Ref Range Status   Specimen Description   Final    BLOOD BLOOD RIGHT ARM Performed at [facility], [address], [place], [place] [address]    Special Requests   Final    BOTTLES DRAWN AEROBIC AND ANAEROBIC Blood Culture results may not be optimal due to an excessive volume of blood received in culture bottles Performed at [facility], [address] [place], [place] [address]    Culture  Setup Time   Final    Organism ID to follow GRAM POSITIVE COCCI IN BOTH AEROBIC AND ANAEROBIC BOTTLES CRITICAL RESULT CALLED TO, READ BACK BY AND VERIFIED WITH: NATHAN BELEU AT 2141 ON 03/24/23 BY SS Performed at [facility], [address] [place], [place] [address]    Culture (A)  Final    STREPTOCOCCUS GROUP G SUSCEPTIBILITIES TO FOLLOW Performed at [facility], [address] [place], [place] [address]    Report Status PENDING  Incomplete  Blood Culture ID Panel (Reflexed)     Status: Abnormal   Collection Time: 03/24/23  9:41 AM  Result Value Ref Range Status   Enterococcus faecalis NOT DETECTED NOT DETECTED Final   Enterococcus Faecium NOT DETECTED NOT DETECTED Final   Listeria monocytogenes NOT DETECTED NOT DETECTED Final   Staphylococcus species NOT DETECTED NOT DETECTED Final   Staphylococcus aureus (BCID) NOT DETECTED NOT DETECTED Final   Staphylococcus epidermidis NOT DETECTED NOT DETECTED Final   Staphylococcus lugdunensis NOT DETECTED NOT DETECTED Final   Streptococcus species DETECTED (A) NOT DETECTED Final    Comment: Not Enterococcus species, Streptococcus agalactiae, Streptococcus pyogenes, or  Streptococcus pneumoniae. CRITICAL RESULT CALLED TO, READ BACK BY AND VERIFIED WITH: NATHAN BELUE AT 2141 ON 03/24/23 BY SS    Streptococcus agalactiae NOT DETECTED NOT DETECTED Final   Streptococcus pneumoniae NOT DETECTED NOT DETECTED Final   Streptococcus pyogenes NOT DETECTED NOT DETECTED Final   A.calcoaceticus-baumannii NOT DETECTED NOT DETECTED Final   Bacteroides fragilis NOT DETECTED NOT DETECTED Final   Enterobacterales NOT DETECTED NOT DETECTED Final   Enterobacter cloacae complex NOT DETECTED NOT DETECTED Final   Escherichia coli NOT DETECTED NOT DETECTED Final   Klebsiella aerogenes NOT DETECTED NOT DETECTED Final   Klebsiella oxytoca NOT DETECTED NOT DETECTED Final   Klebsiella pneumoniae NOT DETECTED NOT DETECTED Final   Proteus species NOT DETECTED NOT DETECTED Final   Salmonella species NOT DETECTED NOT DETECTED Final   Serratia marcescens NOT DETECTED NOT DETECTED Final   Haemophilus influenzae NOT DETECTED NOT DETECTED Final   Neisseria meningitidis NOT DETECTED NOT DETECTED Final   Pseudomonas aeruginosa NOT DETECTED NOT DETECTED Final   Stenotrophomonas maltophilia NOT DETECTED NOT DETECTED Final   Candida albicans  NOT DETECTED NOT DETECTED Final   Candida auris NOT DETECTED NOT DETECTED Final   Candida glabrata NOT DETECTED NOT DETECTED Final   Candida krusei NOT DETECTED NOT DETECTED Final   Candida parapsilosis NOT DETECTED NOT DETECTED Final   Candida tropicalis NOT DETECTED NOT DETECTED Final   Cryptococcus neoformans/gattii NOT DETECTED NOT DETECTED Final    Comment: Performed at [facility], [address] [place], [place] [address]  Culture, blood (Routine x 2)     Status: Abnormal (Preliminary result)   Collection Time: 03/24/23  9:55 AM   Specimen: BLOOD RIGHT ARM  Result Value Ref Range Status   Specimen Description   Final    BLOOD RIGHT ARM Performed at [facility], [address] [place], [place] [address]    Special Requests   Final    BOTTLES DRAWN AEROBIC AND ANAEROBIC Blood Culture adequate volume Performed at [facility], [address] [place], [place] [address]    Culture  Setup Time   Final    GRAM POSITIVE COCCI IN BOTH AEROBIC AND ANAEROBIC BOTTLES CRITICAL VALUE NOTED.  VALUE IS CONSISTENT WITH PREVIOUSLY REPORTED AND CALLED VALUE. Performed at [facility], [address], [place], [place] [address]    Culture STREPTOCOCCUS GROUP G (A)  Final   Report Status PENDING  Incomplete  Resp panel by RT-PCR (RSV, Flu A&B, Covid) Anterior Nasal Swab     Status: None   Collection Time: 03/24/23  9:55 AM   Specimen: Anterior Nasal Swab  Result Value Ref Range Status   SARS Coronavirus 2 by RT PCR NEGATIVE NEGATIVE Final    Comment: (NOTE) SARS-CoV-2 target nucleic acids are NOT DETECTED.  The SARS-CoV-2 RNA is generally detectable in upper respiratory specimens during the acute phase of infection. The lowest concentration of SARS-CoV-2 viral copies this assay can detect is 138 copies/mL. A negative result does not preclude SARS-Cov-2 infection and should not be used as the sole basis for treatment or other patient management  decisions. A negative result may occur with  improper specimen collection/handling, submission of specimen other than nasopharyngeal swab, presence of viral mutation(s) within the areas targeted by this assay, and inadequate number of viral copies(<138 copies/mL). A negative result must be combined with clinical observations, patient history, and epidemiological information. The expected result is Negative.  Fact Sheet for Patients:  BloggerCourse.com  Fact Sheet for Healthcare  Providers:  SeriousBroker.it  This test is no t yet approved or cleared by the [place] and  has been authorized for detection and/or diagnosis of SARS-CoV-2 by FDA under an Emergency Use Authorization (EUA). This EUA will remain  in effect (meaning this test can be used) for the duration of the COVID-19 declaration under Section 564(b)(1) of the Act, 21 U.S.C.section 360bbb-3(b)(1), unless the authorization is terminated  or revoked sooner.       Influenza A by PCR NEGATIVE NEGATIVE Final   Influenza B by PCR NEGATIVE NEGATIVE Final    Comment: (NOTE) The Xpert Xpress SARS-CoV-2/FLU/RSV plus assay is intended as an aid in the diagnosis of influenza from Nasopharyngeal swab specimens and should not be used as a sole basis for treatment. Nasal washings and aspirates are unacceptable for Xpert Xpress SARS-CoV-2/FLU/RSV testing.  Fact Sheet for Patients: BloggerCourse.com  Fact Sheet for Healthcare Providers: SeriousBroker.it  This test is not yet approved or cleared by the [place] FDA and has been authorized for detection and/or diagnosis of SARS-CoV-2 by FDA under an Emergency Use Authorization (EUA). This EUA will remain in effect (meaning this test can be used) for the duration of the COVID-19 declaration under Section 564(b)(1) of the Act, 21 U.S.C. section 360bbb-3(b)(1), unless the  authorization is terminated or revoked.     Resp Syncytial Virus by PCR NEGATIVE NEGATIVE Final    Comment: (NOTE) Fact Sheet for Patients: BloggerCourse.com  Fact Sheet for Healthcare Providers: SeriousBroker.it  This test is not yet approved or cleared by the [place] FDA and has been authorized for detection and/or diagnosis of SARS-CoV-2 by FDA under an Emergency Use Authorization (EUA). This EUA will remain in effect (meaning this test can be used) for the duration of the COVID-19 declaration under Section 564(b)(1) of the Act, 21 U.S.C. section 360bbb-3(b)(1), unless the authorization is terminated or revoked.  Performed at [facility], [address], [place], [place] [address]   Aerobic Culture w Gram Stain (superficial specimen)     Status: None (Preliminary result)   Collection Time: 03/25/23  6:50 PM   Specimen: Wound  Result Value Ref Range Status   Specimen Description   Final    WOUND Performed at [facility], [address] [place], [place] [address]    Special Requests   Final    NONE Performed at [facility], [address] [place], [place] [address]    Gram Stain   Final    FEW WBC PRESENT, PREDOMINANTLY PMN RARE GRAM POSITIVE RODS RARE GRAM POSITIVE COCCI Performed at [facility], [address] [place], [place] [address]    Culture PENDING  Incomplete   Report Status PENDING  Incomplete  Culture, blood (Routine X 2) w Reflex to ID Panel     Status: None (Preliminary result)   Collection Time: 03/26/23  5:06 AM   Specimen: BLOOD  Result Value Ref Range Status   Specimen Description BLOOD BLOOD RIGHT ARM  Final   Special Requests   Final    BOTTLES DRAWN AEROBIC AND ANAEROBIC Blood Culture adequate volume   Culture   Final    NO GROWTH < 24 HOURS Performed at [facility], [address] [place], [place] [address]    Report Status PENDING   Incomplete  Culture, blood (Routine X 2) w Reflex to ID Panel     Status: None (Preliminary result)   Collection Time: 03/26/23  5:06 AM   Specimen:  BLOOD  Result Value Ref Range Status   Specimen Description BLOOD BLOOD RIGHT HAND  Final   Special Requests   Final    BOTTLES DRAWN AEROBIC ONLY Blood Culture adequate volume   Culture   Final    NO GROWTH < 24 HOURS Performed at [facility], [address] [place], [place] [address]    Report Status PENDING  Incomplete         Radiology Studies: No results found.      Scheduled Meds:  allopurinol  100 mg Oral Daily   apixaban  5 mg Oral BID   atorvastatin  80 mg Oral QHS   calcium acetate  2,001 mg Oral TID WC   Chlorhexidine Gluconate Cloth  6 each Topical Q0600   cinacalcet  30 mg Oral Q breakfast   clopidogrel  75 mg Oral Daily   epoetin (EPOGEN/PROCRIT) injection  4,000 Units Intravenous Q T,Th,Sa-HD   feeding supplement (NEPRO CARB STEADY)  237 mL Oral BID BM   insulin aspart  0-5 Units Subcutaneous QHS   insulin aspart  0-9 Units Subcutaneous TID WC   insulin glargine-yfgn  15 Units Subcutaneous QHS   multivitamin  1 tablet Oral Once per day on Monday Wednesday Friday   traZODone  50 mg Oral QHS   Continuous Infusions:  cefTRIAXone (ROCEPHIN)  IV 2 g (03/26/23 1948)     LOS: 3 days    Time spent: 25 mins     Charise Killian, MD [facility] Pager [phone]  If 7PM-7AM, please contact night-coverage [email or web address] 03/27/2023, 7:19 AM

## 2023-03-27 NOTE — Progress Notes (Signed)
[facility]  PROGRESS NOTE   Subjective:   Seen at bedside.  Sitting in bed in NAD.  Ate well today.  Objective:  Vital signs: Blood pressure (!) 143/75, pulse 62, temperature 98.1 F (36.7 C), temperature source Oral, resp. rate 18, height 6\' 4"  (1.93 m), weight (!) 171.8 kg, SpO2 94%.  Intake/Output Summary (Last 24 hours) at 03/27/2023 1142 Last data filed at 03/27/2023 1051 Gross per 24 hour  Intake 580 ml  Output 2000 ml  Net -1420 ml   Filed Weights   03/24/23 0931 03/26/23 0756 03/26/23 1225  Weight: (!) 175.4 kg (!) 173.8 kg (!) 171.8 kg     Physical Exam: General:  No acute distress  Head:  Normocephalic, atraumatic. Moist oral mucosal membranes  Eyes:  Anicteric  Neck:  Supple  Lungs:   Clear to auscultation, normal effort  Heart:  S1S2 no rubs  Abdomen:   Soft, nontender, bowel sounds present  Extremities:  peripheral edema.  Neurologic:  Awake, alert, following commands  Skin:  No lesions  Access:     Basic Metabolic Panel: Recent Labs  Lab 03/24/23 0941 03/25/23 0615 03/26/23 0506 03/27/23 0443  NA 135 136 135 136  K 3.6 4.0 3.8 3.9  CL 97* 98 99 99  CO2 27 25 24 27   GLUCOSE 137* 116* 121* 138*  BUN 43* 61* 76* 56*  CREATININE 7.85* 9.98* 12.26* 9.58*  CALCIUM 9.1 8.9 9.1 9.4   GFR: Estimated Creatinine Clearance: 15.2 mL/min (A) (by C-G formula based on SCr of 9.58 mg/dL (H)).  Liver Function Tests: Recent Labs  Lab 03/24/23 0941  AST 18  ALT 15  ALKPHOS 56  BILITOT 0.9  PROT 9.1*  ALBUMIN 3.9   No results for input(s): "LIPASE", "AMYLASE" in the last 168 hours. No results for input(s): "AMMONIA" in the last 168 hours.  CBC: Recent Labs  Lab 03/24/23 0941 03/25/23 0615 03/26/23 0506 03/27/23 0443  WBC 10.2 15.9* 12.6* 9.9  NEUTROABS 9.6*  --   --   --   HGB 12.4* 10.7* 10.7* 11.2*  HCT 39.4 33.2* 33.6* 33.9*  MCV 93.6 92.7 93.3 91.1  PLT 163 127* 123* 126*     HbA1C: Hgb A1c MFr Bld  Date/Time Value  Ref Range Status  09/27/2022 08:35 PM 8.8 (H) 4.8 - 5.6 % Final    Comment:    (NOTE) Pre diabetes:          5.7%-6.4%  Diabetes:              >6.4%  Glycemic control for   <7.0% adults with diabetes   02/23/2019 04:32 AM 8.3 (H) 4.8 - 5.6 % Final    Comment:    (NOTE)         Prediabetes: 5.7 - 6.4         Diabetes: >6.4         Glycemic control for adults with diabetes: <7.0     Urinalysis: No results for input(s): "COLORURINE", "LABSPEC", "PHURINE", "GLUCOSEU", "HGBUR", "BILIRUBINUR", "KETONESUR", "PROTEINUR", "UROBILINOGEN", "NITRITE", "LEUKOCYTESUR" in the last 72 hours.  Invalid input(s): "APPERANCEUR"    Imaging: No results found.   Medications:    cefTRIAXone (ROCEPHIN)  IV 2 g (03/26/23 1948)    allopurinol  100 mg Oral Daily   apixaban  5 mg Oral BID   atorvastatin  80 mg Oral QHS   calcium acetate  2,001 mg Oral TID WC   Chlorhexidine Gluconate Cloth  6 each Topical Q0600  cinacalcet  30 mg Oral Q breakfast   clopidogrel  75 mg Oral Daily   epoetin (EPOGEN/PROCRIT) injection  4,000 Units Intravenous Q T,Th,Sa-HD   feeding supplement (NEPRO CARB STEADY)  237 mL Oral BID BM   insulin aspart  0-5 Units Subcutaneous QHS   insulin aspart  0-9 Units Subcutaneous TID WC   insulin glargine-yfgn  15 Units Subcutaneous QHS   multivitamin  1 tablet Oral Once per day on Monday Wednesday Friday   traZODone  50 mg Oral QHS    Assessment/ Plan:     [name] is a 52 y.o.  male  with medical history significant of ESRD-HD (TTS), HTN. HJLD, DM, COPD, PVD on Eliquis, gout, obesity, chronic ulcer in left leg and foot, who presents with fever, chills, weakness.    Principal Problem:   Sepsis (HCC) Active Problems:   Hypertension   End stage renal disease (HCC)   Obesity, Class III, BMI 40-49.9 (morbid obesity) (HCC)   Type 1 diabetes mellitus (HCC)   PVD (peripheral vascular disease) (HCC)   Gout   HLD (hyperlipidemia)   COPD (chronic obstructive  pulmonary disease) (HCC)   Leg wound, left  #1: End-stage renal disease: Patient has been on TTS schedule for dialysis.  Had stable dialysis yesterday.  Blood work reviewed.  #2: Anemia: Will continue iron and Epogen at dialysis.  #3: Diabetes: Continue insulin as ordered.  #4: Sepsis/leg wounds: Continue Rocephin as ordered.  #5: MBD: Continue calcium acetate as ordered.  Will follow.    LOS: 3 Lorain Childes, MD [facility] 7/28/202411:42 AM

## 2023-03-28 DIAGNOSIS — E1122 Type 2 diabetes mellitus with diabetic chronic kidney disease: Secondary | ICD-10-CM | POA: Diagnosis not present

## 2023-03-28 DIAGNOSIS — N186 End stage renal disease: Secondary | ICD-10-CM | POA: Diagnosis not present

## 2023-03-28 DIAGNOSIS — B955 Unspecified streptococcus as the cause of diseases classified elsewhere: Secondary | ICD-10-CM

## 2023-03-28 DIAGNOSIS — A408 Other streptococcal sepsis: Secondary | ICD-10-CM | POA: Diagnosis not present

## 2023-03-28 DIAGNOSIS — R7881 Bacteremia: Secondary | ICD-10-CM | POA: Diagnosis not present

## 2023-03-28 LAB — GLUCOSE, CAPILLARY
Glucose-Capillary: 114 mg/dL — ABNORMAL HIGH (ref 70–99)
Glucose-Capillary: 113 mg/dL — ABNORMAL HIGH (ref 70–99)
Glucose-Capillary: 124 mg/dL — ABNORMAL HIGH (ref 70–99)
Glucose-Capillary: 98 mg/dL (ref 70–99)

## 2023-03-28 MED ORDER — SODIUM CHLORIDE 0.9 % IV SOLN: INTRAVENOUS | Status: DC

## 2023-03-28 MED ORDER — CEFAZOLIN SODIUM-DEXTROSE 2-4 GM/100ML-% IV SOLN
2.0000 g | INTRAVENOUS | Status: DC
  Administered 2023-03-29: 2 g via INTRAVENOUS
  Filled 2023-03-28 (×2): qty 100

## 2023-03-28 MED ORDER — ATENOLOL 25 MG PO TABS
25.0000 mg | ORAL_TABLET | Freq: Every day | ORAL | Status: DC
  Administered 2023-03-28 – 2023-03-29 (×2): 25 mg via ORAL
  Filled 2023-03-28 (×2): qty 1

## 2023-03-28 MED ORDER — CEFAZOLIN IN SODIUM CHLORIDE 3-0.9 GM/100ML-% IV SOLN: 3.0000 g | INTRAVENOUS | Status: DC

## 2023-03-28 MED ORDER — GUAIFENESIN 100 MG/5ML PO LIQD
5.0000 mL | ORAL | Status: DC | PRN
  Administered 2023-03-28: 5 mL via ORAL
  Filled 2023-03-28: qty 10

## 2023-03-28 MED ORDER — CEFAZOLIN SODIUM-DEXTROSE 2-4 GM/100ML-% IV SOLN: 2.0000 g | INTRAVENOUS | Status: DC

## 2023-03-28 NOTE — Treatment Plan (Signed)
Diagnosis: Streptococcus Group G bacteremia Baseline Creatinine ESRD    Allergies  Allergen Reactions   Lisinopril Swelling    Lips mouth    Furosemide Cough    Light headed, blurry vision, weakness.   Latex Itching   Adhesive [Tape] Itching and Rash    OPAT Orders Discharge antibiotics: Antibiotic to be given during dialysis Cefazolin 2 grams on Tuesday and Thursday and 3 grams on Saturday Duration: 4 weeks End Date: 04/20/23    Labs weekly while on IV antibiotics: _X_ CBC with differential  _X_ CMP   Fax weekly lab results  promptly to [phone]  Clinic Follow Up Appt: 04/14/23 at 11.30AM   Call [phone] with any questions

## 2023-03-28 NOTE — Progress Notes (Signed)
PHARMACY CONSULT NOTE FOR:  OUTPATIENT  PARENTERAL ANTIBIOTIC THERAPY (OPAT)  Indication:  Streptococcus Group G bacteremia Regimen: Cefazolin 2gm IV with HD on Tue and THur and Cefazolin 3gm with HD on Sat End date: 04/20/2023 (last dose 04/19/2023)  Labs - Once weekly:  CBC/D and CMP Fax weekly lab results promptly to [phone]   IV antibiotic discharge orders are pended. To discharging provider:  please sign these orders via discharge navigator,  Select New Orders & click on the button choice - Manage This Unsigned Work.     Thank you for allowing pharmacy to be a part of this patient's care.  Juliette Alcide, PharmD, BCPS, BCIDP Work Cell: [phone] 03/28/2023 3:00 PM

## 2023-03-28 NOTE — Progress Notes (Signed)
[facility]  PROGRESS NOTE   Subjective:   Patient seen sitting up in bed Alert and oriented No family present Denies pain or discomfort No known fever or chills  Room air   Objective:  Vital signs: Blood pressure (!) 159/80, pulse (!) 59, temperature 97.7 F (36.5 C), temperature source Oral, resp. rate 18, height 6\' 4"  (1.93 m), weight (!) 171.8 kg, SpO2 94%.  Intake/Output Summary (Last 24 hours) at 03/28/2023 1343 Last data filed at 03/28/2023 0039 Gross per 24 hour  Intake 697.42 ml  Output --  Net 697.42 ml   Filed Weights   03/24/23 0931 03/26/23 0756 03/26/23 1225  Weight: (!) 175.4 kg (!) 173.8 kg (!) 171.8 kg     Physical Exam: General:  No acute distress  Head:  Normocephalic, atraumatic. Moist oral mucosal membranes  Eyes:  Anicteric  Lungs:   Clear to auscultation, normal effort  Heart:  S1S2 no rubs  Abdomen:   Soft, nontender, bowel sounds present  Extremities:  No peripheral edema.  Left lower leg wrapped  Neurologic:  Awake, alert, following commands  Skin:  No lesions  Access: Left arm AV graft      Basic Metabolic Panel: Recent Labs  Lab 03/24/23 0941 03/25/23 0615 03/26/23 0506 03/27/23 0443 03/28/23 0514  NA 135 136 135 136 138  K 3.6 4.0 3.8 3.9 4.1  CL 97* 98 99 99 99  CO2 27 25 24 27 25   GLUCOSE 137* 116* 121* 138* 142*  BUN 43* 61* 76* 56* 71*  CREATININE 7.85* 9.98* 12.26* 9.58* 11.89*  CALCIUM 9.1 8.9 9.1 9.4 9.4   GFR: Estimated Creatinine Clearance: 12.3 mL/min (A) (by C-G formula based on SCr of 11.89 mg/dL (H)).  Liver Function Tests: Recent Labs  Lab 03/24/23 0941  AST 18  ALT 15  ALKPHOS 56  BILITOT 0.9  PROT 9.1*  ALBUMIN 3.9   No results for input(s): "LIPASE", "AMYLASE" in the last 168 hours. No results for input(s): "AMMONIA" in the last 168 hours.  CBC: Recent Labs  Lab 03/24/23 0941 03/25/23 0615 03/26/23 0506 03/27/23 0443 03/28/23 0514  WBC 10.2 15.9* 12.6* 9.9 6.8  NEUTROABS  9.6*  --   --   --   --   HGB 12.4* 10.7* 10.7* 11.2* 10.6*  HCT 39.4 33.2* 33.6* 33.9* 32.4*  MCV 93.6 92.7 93.3 91.1 92.3  PLT 163 127* 123* 126* 144*     HbA1C: Hgb A1c MFr Bld  Date/Time Value Ref Range Status  09/27/2022 08:35 PM 8.8 (H) 4.8 - 5.6 % Final    Comment:    (NOTE) Pre diabetes:          5.7%-6.4%  Diabetes:              >6.4%  Glycemic control for   <7.0% adults with diabetes   02/23/2019 04:32 AM 8.3 (H) 4.8 - 5.6 % Final    Comment:    (NOTE)         Prediabetes: 5.7 - 6.4         Diabetes: >6.4         Glycemic control for adults with diabetes: <7.0     Urinalysis: No results for input(s): "COLORURINE", "LABSPEC", "PHURINE", "GLUCOSEU", "HGBUR", "BILIRUBINUR", "KETONESUR", "PROTEINUR", "UROBILINOGEN", "NITRITE", "LEUKOCYTESUR" in the last 72 hours.  Invalid input(s): "APPERANCEUR"    Imaging: ECHOCARDIOGRAM COMPLETE  Result Date: 03/27/2023    ECHOCARDIOGRAM REPORT   Patient Name:   Cloyce A Mcmartin Date of Exam:  03/27/2023 Medical Rec #:  [id number]     Height:       76.0 in Accession #:    [id number]    Weight:       378.7 lb Date of Birth:  [birth year]     BSA:          2.906 m Patient Age:    52 years      BP:           147/98 mmHg Patient Gender: M             HR:           68 bpm. Exam Location:  [facility] Procedure: 2D Echo, Cardiac Doppler and Color Doppler Indications:    Bacteremia R78.81  History:        Patient has prior history of Echocardiogram examinations, most                 recent 10/16/2017. COPD and ESRD on dialysis; Peripheral vascular                 disease; Risk Factors:Hypertension, Dyslipidemia, Diabetes and                 Former Smoker.  Sonographer:    Dondra Prader RVT RCS Referring Phys: [id number] Lynn Ito  Sonographer Comments: Technically challenging study due to limited acoustic windows and patient is obese. Image acquisition challenging due to patient body habitus. IMPRESSIONS  1. Left ventricular ejection fraction, by  estimation, is 60 to 65%. The left ventricle has normal function. The left ventricle has no regional wall motion abnormalities. The left ventricular internal cavity size was mildly dilated. There is moderate left ventricular hypertrophy. Left ventricular diastolic parameters are consistent with Grade II diastolic dysfunction (pseudonormalization).  2. Right ventricular systolic function is normal. The right ventricular size is normal.  3. Left atrial size was moderately dilated.  4. The mitral valve is normal in structure. Trivial mitral valve regurgitation. No evidence of mitral stenosis.  5. The aortic valve is grossly normal. There is moderate calcification of the aortic valve. There is mild thickening of the aortic valve. Aortic valve regurgitation is not visualized. Aortic valve sclerosis/calcification is present, without any evidence  of aortic stenosis.  6. Aortic dilatation noted. There is borderline dilatation of the ascending aorta, measuring 38 mm.  7. The inferior vena cava is normal in size with greater than 50% respiratory variability, suggesting right atrial pressure of 3 mmHg. Comparison(s): No significant change from prior study. 10/16/17- EF 55-60%. Conclusion(s)/Recommendation(s): No evidence of valvular vegetations on this transthoracic echocardiogram. Consider a transesophageal echocardiogram to exclude infective endocarditis if clinically indicated. FINDINGS  Left Ventricle: Left ventricular ejection fraction, by estimation, is 60 to 65%. The left ventricle has normal function. The left ventricle has no regional wall motion abnormalities. The left ventricular internal cavity size was mildly dilated. There is  moderate left ventricular hypertrophy. Left ventricular diastolic parameters are consistent with Grade II diastolic dysfunction (pseudonormalization). Right Ventricle: The right ventricular size is normal. Right vetricular wall thickness was not well visualized. Right ventricular systolic  function is normal. Left Atrium: Left atrial size was moderately dilated. Right Atrium: Right atrial size was normal in size. Pericardium: There is no evidence of pericardial effusion. Mitral Valve: The mitral valve is normal in structure. There is mild thickening of the mitral valve leaflet(s). There is mild calcification of the mitral valve leaflet(s). Trivial mitral valve regurgitation. No evidence of mitral valve stenosis. Tricuspid Valve:  The tricuspid valve is not well visualized. Tricuspid valve regurgitation is trivial. No evidence of tricuspid stenosis. Aortic Valve: The aortic valve is grossly normal. There is moderate calcification of the aortic valve. There is mild thickening of the aortic valve. Aortic valve regurgitation is not visualized. Aortic valve sclerosis/calcification is present, without any evidence of aortic stenosis. Aortic valve mean gradient measures 6.0 mmHg. Aortic valve peak gradient measures 11.4 mmHg. Aortic valve area, by VTI measures 2.70 cm. Pulmonic Valve: The pulmonic valve was not well visualized. Pulmonic valve regurgitation is trivial. No evidence of pulmonic stenosis. Aorta: Aortic dilatation noted. There is borderline dilatation of the ascending aorta, measuring 38 mm. Venous: The inferior vena cava is normal in size with greater than 50% respiratory variability, suggesting right atrial pressure of 3 mmHg. IAS/Shunts: The atrial septum is grossly normal.  LEFT VENTRICLE PLAX 2D LVIDd:         5.60 cm   Diastology LVIDs:         3.35 cm   LV e' medial:    7.07 cm/s LV PW:         1.50 cm   LV E/e' medial:  16.8 LV IVS:        1.30 cm   LV e' lateral:   10.70 cm/s LVOT diam:     2.00 cm   LV E/e' lateral: 11.1 LV SV:         100 LV SV Index:   34 LVOT Area:     3.14 cm  RIGHT VENTRICLE             IVC RV S prime:     17.30 cm/s  IVC diam: 2.00 cm TAPSE (M-mode): 2.9 cm LEFT ATRIUM              Index        RIGHT ATRIUM           Index LA diam:        4.30 cm  1.48 cm/m   RA  Area:     15.20 cm LA Vol (A2C):   96.8 ml  33.31 ml/m  RA Volume:   38.00 ml  13.08 ml/m LA Vol (A4C):   104.3 ml 35.88 ml/m LA Biplane Vol: 109.0 ml 37.51 ml/m  AORTIC VALVE                     PULMONIC VALVE AV Area (Vmax):    2.64 cm      PV Vmax:       1.01 m/s AV Area (Vmean):   2.68 cm      PV Peak grad:  4.1 mmHg AV Area (VTI):     2.70 cm AV Vmax:           169.00 cm/s AV Vmean:          110.000 cm/s AV VTI:            0.371 m AV Peak Grad:      11.4 mmHg AV Mean Grad:      6.0 mmHg LVOT Vmax:         142.00 cm/s LVOT Vmean:        93.700 cm/s LVOT VTI:          0.319 m LVOT/AV VTI ratio: 0.86  AORTA Ao Root diam: 3.20 cm Ao Asc diam:  3.80 cm MITRAL VALVE MV Area (PHT): 3.72 cm     SHUNTS MV Decel Time: 204 msec  Systemic VTI:  0.32 m MV E velocity: 119.00 cm/s  Systemic Diam: 2.00 cm MV A velocity: 89.30 cm/s MV E/A ratio:  1.33 Jodelle Red MD Electronically signed by Jodelle Red MD Signature Date/Time: 03/27/2023/1:04:52 PM    Final      Medications:    sodium chloride Stopped (03/27/23 2339)   cefTRIAXone (ROCEPHIN)  IV Stopped (03/27/23 2155)    allopurinol  100 mg Oral Daily   amLODipine  5 mg Oral Daily   apixaban  5 mg Oral BID   atorvastatin  80 mg Oral QHS   calcium acetate  2,001 mg Oral TID WC   Chlorhexidine Gluconate Cloth  6 each Topical Q0600   cinacalcet  30 mg Oral Q breakfast   clopidogrel  75 mg Oral Daily   epoetin (EPOGEN/PROCRIT) injection  4,000 Units Intravenous Q T,Th,Sa-HD   feeding supplement (NEPRO CARB STEADY)  237 mL Oral BID BM   insulin aspart  0-5 Units Subcutaneous QHS   insulin aspart  0-9 Units Subcutaneous TID WC   insulin glargine-yfgn  15 Units Subcutaneous QHS   multivitamin  1 tablet Oral Once per day on Monday Wednesday Friday   traZODone  50 mg Oral QHS    Assessment/ Plan:     Mr. ABRAHIM NORDAN is a 52 y.o.  male  with medical history significant of ESRD-HD (TTS), HTN. HJLD, DM, COPD, PVD on Eliquis, gout,  obesity, chronic ulcer in left leg and foot, who presents with fever, chills, weakness.    Principal Problem:   Sepsis (HCC) Active Problems:   Hypertension   End stage renal disease (HCC)   Obesity, Class III, BMI 40-49.9 (morbid obesity) (HCC)   Type 1 diabetes mellitus (HCC)   PVD (peripheral vascular disease) (HCC)   Gout   HLD (hyperlipidemia)   COPD (chronic obstructive pulmonary disease) (HCC)   Leg wound, left  #1: End-stage renal disease on hemodialysis: Patient has been on TTS schedule for dialysis.  Next treatment scheduled for Tuesday  #2: Anemia with chronic kidney disease: Hemoglobin at goal, 10.6.  Patient receives Mircera at outpatient clinic.  Will consider ESA if below 10.  #3: Diabetes mellitus type II with chronic kidney disease: insulin dependent. Home regimen includes Lantus.  Glucose well-controlled during this admission. Primary team to manage sliding scale insulin   #4: Sepsis/chronic leg wounds: Receiving IV ceftriaxone and awaiting cultures.  #5: Secondary Hyperparathyroidism: with outpatient labs: PTH 165, phosphorus 5.4, calcium 9.1 on 03/08/23.   Lab Results  Component Value Date   CALCIUM 9.4 03/28/2023   CAION 1.14 (L) 11/12/2022   PHOS 6.6 (H) 11/25/2022    Patient prescribed calcium acetate and Sensipar outpatient.  These medications have been continued during this admission     LOS: 4 [facility] 7/29/20241:43 PM

## 2023-03-28 NOTE — Progress Notes (Signed)
Date of Admission:  03/24/2023     ID: [name] is a 52 y.o. male Principal Problem:   Sepsis (HCC) Active Problems:   Hypertension   End stage renal disease (HCC)   Obesity, Class III, BMI 40-49.9 (morbid obesity) (HCC)   Type 1 diabetes mellitus (HCC)   PVD (peripheral vascular disease) (HCC)   Gout   HLD (hyperlipidemia)   COPD (chronic obstructive pulmonary disease) (HCC)   Leg wound, left    Subjective: Pt is feeling better  Medications:   allopurinol  100 mg Oral Daily   amLODipine  5 mg Oral Daily   apixaban  5 mg Oral BID   atorvastatin  80 mg Oral QHS   calcium acetate  2,001 mg Oral TID WC   Chlorhexidine Gluconate Cloth  6 each Topical Q0600   cinacalcet  30 mg Oral Q breakfast   clopidogrel  75 mg Oral Daily   epoetin (EPOGEN/PROCRIT) injection  4,000 Units Intravenous Q T,Th,Sa-HD   feeding supplement (NEPRO CARB STEADY)  237 mL Oral BID BM   insulin aspart  0-5 Units Subcutaneous QHS   insulin aspart  0-9 Units Subcutaneous TID WC   insulin glargine-yfgn  15 Units Subcutaneous QHS   multivitamin  1 tablet Oral Once per day on Monday Wednesday Friday   traZODone  50 mg Oral QHS    Objective: Vital signs in last 24 hours: Patient Vitals for the past 24 hrs:  BP Temp Temp src Pulse Resp SpO2  03/28/23 0842 (!) 159/80 97.7 F (36.5 C) Oral (!) 59 18 94 %  03/28/23 0453 (!) 155/77 97.8 F (36.6 C) Oral 60 18 94 %  03/27/23 2031 (!) 158/76 98.3 F (36.8 C) -- 62 20 97 %  03/27/23 1740 (!) 157/79 99.2 F (37.3 C) Oral (!) 56 18 98 %  03/27/23 1410 (!) 148/83 -- -- 60 20 98 %       PHYSICAL EXAM:  General: Alert, cooperative, no distress, appears stated age.   Lungs: Clear to auscultation bilaterally. No Wheezing or Rhonchi. No rales. Heart: Regular rate and rhythm, no murmur, rub or gallop. Abdomen: Soft, non-tender,not distended. Bowel sounds normal. No masses Extremities: left fore arm AVG B/l legs lymphedema Wound left leg Skin: No  rashes or lesions. Or bruising Lymph: Cervical, supraclavicular normal. Neurologic: Grossly non-focal  Lab Results    Latest Ref Rng & Units 03/28/2023    5:14 AM 03/27/2023    4:43 AM 03/26/2023    5:06 AM  CBC  WBC 4.0 - 10.5 K/uL 6.8  9.9  12.6   Hemoglobin 13.0 - 17.0 g/dL 52.8  41.3  24.4   Hematocrit 39.0 - 52.0 % 32.4  33.9  33.6   Platelets 150 - 400 K/uL 144  126  123        Latest Ref Rng & Units 03/28/2023    5:14 AM 03/27/2023    4:43 AM 03/26/2023    5:06 AM  CMP  Glucose 70 - 99 mg/dL 010  272  536   BUN 6 - 20 mg/dL 71  56  76   Creatinine 0.61 - 1.24 mg/dL 64.40  3.47  42.59   Sodium 135 - 145 mmol/L 138  136  135   Potassium 3.5 - 5.1 mmol/L 4.1  3.9  3.8   Chloride 98 - 111 mmol/L 99  99  99   CO2 22 - 32 mmol/L 25  27  24    Calcium 8.9 -  10.3 mg/dL 9.4  9.4  9.1       Microbiology:  Studies/Results: ECHOCARDIOGRAM COMPLETE  Result Date: 03/27/2023    ECHOCARDIOGRAM REPORT   Patient Name:   [name] Date of Exam: 03/27/2023 Medical Rec #:  [id number]     Height:       76.0 in Accession #:    [id number]    Weight:       378.7 lb Date of Birth:  1971-01-06     BSA:          2.906 m Patient Age:    52 years      BP:           147/98 mmHg Patient Gender: M             HR:           68 bpm. Exam Location:  [facility] Procedure: 2D Echo, Cardiac Doppler and Color Doppler Indications:    Bacteremia R78.81  History:        Patient has prior history of Echocardiogram examinations, most                 recent 10/16/2017. COPD and ESRD on dialysis; Peripheral vascular                 disease; Risk Factors:Hypertension, Dyslipidemia, Diabetes and                 Former Smoker.  Sonographer:    Dondra Prader RVT RCS Referring Phys: [id number] Lynn Ito  Sonographer Comments: Technically challenging study due to limited acoustic windows and patient is obese. Image acquisition challenging due to patient body habitus. IMPRESSIONS  1. Left ventricular ejection fraction, by  estimation, is 60 to 65%. The left ventricle has normal function. The left ventricle has no regional wall motion abnormalities. The left ventricular internal cavity size was mildly dilated. There is moderate left ventricular hypertrophy. Left ventricular diastolic parameters are consistent with Grade II diastolic dysfunction (pseudonormalization).  2. Right ventricular systolic function is normal. The right ventricular size is normal.  3. Left atrial size was moderately dilated.  4. The mitral valve is normal in structure. Trivial mitral valve regurgitation. No evidence of mitral stenosis.  5. The aortic valve is grossly normal. There is moderate calcification of the aortic valve. There is mild thickening of the aortic valve. Aortic valve regurgitation is not visualized. Aortic valve sclerosis/calcification is present, without any evidence  of aortic stenosis.  6. Aortic dilatation noted. There is borderline dilatation of the ascending aorta, measuring 38 mm.  7. The inferior vena cava is normal in size with greater than 50% respiratory variability, suggesting right atrial pressure of 3 mmHg. Comparison(s): No significant change from prior study. 10/16/17- EF 55-60%. Conclusion(s)/Recommendation(s): No evidence of valvular vegetations on this transthoracic echocardiogram. Consider a transesophageal echocardiogram to exclude infective endocarditis if clinically indicated. FINDINGS  Left Ventricle: Left ventricular ejection fraction, by estimation, is 60 to 65%. The left ventricle has normal function. The left ventricle has no regional wall motion abnormalities. The left ventricular internal cavity size was mildly dilated. There is  moderate left ventricular hypertrophy. Left ventricular diastolic parameters are consistent with Grade II diastolic dysfunction (pseudonormalization). Right Ventricle: The right ventricular size is normal. Right vetricular wall thickness was not well visualized. Right ventricular systolic  function is normal. Left Atrium: Left atrial size was moderately dilated. Right Atrium: Right atrial size was normal in size. Pericardium: There is no evidence of pericardial effusion.  Mitral Valve: The mitral valve is normal in structure. There is mild thickening of the mitral valve leaflet(s). There is mild calcification of the mitral valve leaflet(s). Trivial mitral valve regurgitation. No evidence of mitral valve stenosis. Tricuspid Valve: The tricuspid valve is not well visualized. Tricuspid valve regurgitation is trivial. No evidence of tricuspid stenosis. Aortic Valve: The aortic valve is grossly normal. There is moderate calcification of the aortic valve. There is mild thickening of the aortic valve. Aortic valve regurgitation is not visualized. Aortic valve sclerosis/calcification is present, without any evidence of aortic stenosis. Aortic valve mean gradient measures 6.0 mmHg. Aortic valve peak gradient measures 11.4 mmHg. Aortic valve area, by VTI measures 2.70 cm. Pulmonic Valve: The pulmonic valve was not well visualized. Pulmonic valve regurgitation is trivial. No evidence of pulmonic stenosis. Aorta: Aortic dilatation noted. There is borderline dilatation of the ascending aorta, measuring 38 mm. Venous: The inferior vena cava is normal in size with greater than 50% respiratory variability, suggesting right atrial pressure of 3 mmHg. IAS/Shunts: The atrial septum is grossly normal.  LEFT VENTRICLE PLAX 2D LVIDd:         5.60 cm   Diastology LVIDs:         3.35 cm   LV e' medial:    7.07 cm/s LV PW:         1.50 cm   LV E/e' medial:  16.8 LV IVS:        1.30 cm   LV e' lateral:   10.70 cm/s LVOT diam:     2.00 cm   LV E/e' lateral: 11.1 LV SV:         100 LV SV Index:   34 LVOT Area:     3.14 cm  RIGHT VENTRICLE             IVC RV S prime:     17.30 cm/s  IVC diam: 2.00 cm TAPSE (M-mode): 2.9 cm LEFT ATRIUM              Index        RIGHT ATRIUM           Index LA diam:        4.30 cm  1.48 cm/m   RA  Area:     15.20 cm LA Vol (A2C):   96.8 ml  33.31 ml/m  RA Volume:   38.00 ml  13.08 ml/m LA Vol (A4C):   104.3 ml 35.88 ml/m LA Biplane Vol: 109.0 ml 37.51 ml/m  AORTIC VALVE                     PULMONIC VALVE AV Area (Vmax):    2.64 cm      PV Vmax:       1.01 m/s AV Area (Vmean):   2.68 cm      PV Peak grad:  4.1 mmHg AV Area (VTI):     2.70 cm AV Vmax:           169.00 cm/s AV Vmean:          110.000 cm/s AV VTI:            0.371 m AV Peak Grad:      11.4 mmHg AV Mean Grad:      6.0 mmHg LVOT Vmax:         142.00 cm/s LVOT Vmean:        93.700 cm/s LVOT VTI:  0.319 m LVOT/AV VTI ratio: 0.86  AORTA Ao Root diam: 3.20 cm Ao Asc diam:  3.80 cm MITRAL VALVE MV Area (PHT): 3.72 cm     SHUNTS MV Decel Time: 204 msec     Systemic VTI:  0.32 m MV E velocity: 119.00 cm/s  Systemic Diam: 2.00 cm MV A velocity: 89.30 cm/s MV E/A ratio:  1.33 Jodelle Red MD Electronically signed by Jodelle Red MD Signature Date/Time: 03/27/2023/1:04:52 PM    Final      Assessment/Plan: Streptococcus group G bacteremia 4 out of 4 Source could be the left leg but he also is on dialysis The culture has been sent from the left leg wound and so far has got multiple organisms Repeat blood culture has been negative 2D echo does not show any obvious valve vegetation We discussed TEE with the patient to rule out any colitis.  Patient is apprehensive about getting TEE The option would be then to give 4 weeks of IV antibiotics   he will get cefazolin during dialysis for 4 weeks  Bilateral lymphedema of the legs  End-stage renal disease on dialysis  Diabetes mellitus management as per primary team  Hypertension  Increased BMI  Discussed the management with the patient and the care team. OPAT orders placed  Patient talked to the dialysis APP and agreed to have TEE

## 2023-03-28 NOTE — Discharge Summary (Incomplete)
Physician Discharge Summary  [name] WUJ:[id number] DOB: July 18, 1971 DOA: 03/24/2023  PCP: Marina Goodell, MD  Admit date: 03/24/2023 Discharge date: 03/30/23  Admitted From: home  Disposition:  home   Recommendations for Outpatient Follow-up:  Follow up with PCP in 1-2 weeks F/u w/ nephro in 1-2 weeks  F/u w/ ID, Dr. Rivka Safer, 04/14/23 at 11:30 AM   Home Health: no  Equipment/Devices:  Discharge Condition: stable  CODE STATUS: full  Diet recommendation: Heart Healthy / Carb Modified  Brief/Interim Summary: HPI was taken from Dr. Clyde Lundborg: [name] is a 52 y.o. male with medical history significant of ESRD-HD (TTS), HTN. HJLD, DM, COPD, PVD on Eliquis, gout, obesity, chronic ulcer in left leg and foot, who presents with fever, chills, weakness.   Pt is brought in from dialysis center. Pt has received 3-hour dialysis treatment from the usual 4 1/2 hr of treatment.  During the dialysis, patient developed fever and chills.  His temperature is 102.6 in ED.  Patient has fatigue and generalized weakness.  Denies cough, shortness of breath or chest pain.  He has nausea and vomited once earlier, which has resolved.  Currently no nausea, vomiting, diarrhea or abdominal pain.  Patient does not make urine anymore, no symptoms of UTI.  Patient has chronic wound in left lower leg which is being taken care of by wound care nurse at home.  He denies worsening pain in the left lower leg wound.  Denies tenderness over left arm AV fistula.   Data reviewed independently and ED Course: pt was found to have WBC 10.2, potassium 3.6, bicarbonate of 27, creatinine 7.85, BUN 43, negative PCR for COVID, flu and RSV.  Temperature 102.6, blood pressure 136/45, heart rate 80, RR 29, oxygen saturation 96% on room air.  Chest x-ray showed cardiomegaly, mild vascular congestion, negative for infiltration.  X-ray of left foot is negative for evidence of osteomyelitis.  Patient is admitted to MedSurg bed as  inpatient.  Dr. Thedore Mins of renal was consulted.     Discharge Diagnoses:  Principal Problem:   Sepsis (HCC) Active Problems:   Leg wound, left   Hypertension   End stage renal disease (HCC)   Type 1 diabetes mellitus (HCC)   Gout   HLD (hyperlipidemia)   COPD (chronic obstructive pulmonary disease) (HCC)   PVD (peripheral vascular disease) (HCC)   Obesity, Class III, BMI 40-49.9 (morbid obesity) (HCC)   Bacteremia due to Streptococcus   Endocarditis  Sepsis: met criteria w/ fever, tachypnea, leukocytosis & possible left leg wound. Continue on IV rocephin as per ID. Sepsis resolved    Bacteremia: blood cx growing group G strept . Continue on IV rocephin as per ID. Echo shows EF 60-65%, grade II diastolic dysfunction, no regional wall motion abnormalities & no evidence of valvular vegetations. Repeat blood cxs NGTD. ID recs TEE. S/p TEE which was neg for vegetations as per cardio. Will get IV cefazolin during HD x 4 weeks as per ID    Left leg wound: continue w/ wound care. Leg wound gram stain showing gram positive rods & gram positive cocci. Continue on abxs as per ID    HTN: continue on amlodipine & atenolol    ESRD: on HD TTS. Nephro following and recs apprec    DM2: HbA1c 8.8, poorly controlled. Continue on glargine, SSI w/ accuchecks    Gout: continue on home dose of allopurinol    HLD: continue on statin     COPD: w/o exacerbation. Bronchodilators prn.  Encourage incentive spirometry    PVD: continue plavix, statin, eliquis    Morbid obesity: BMI 47.0. Complicates overall care & prognosis    Thrombocytopenia: etiology unclear. Labile    ACD: likely secondary to ESRD. Will transfuse if Hb < 7.0   Discharge Instructions  Discharge Instructions     Diet Carb Modified   Complete by: As directed    Discharge instructions   Complete by: As directed    F/u w/ PCP in 1-2 weeks. F/u w/ nephro in 1-2 weeks. F/u w/ ID, Dr. Rivka Safer, on 04/14/23 at 11.30AM   Discharge  wound care:   Complete by: As directed    Wound care  Daily      Comments: Clean L anterior foot and L medial leg wounds with NS, apply Silver Hydrofiber [place] [phone] cut to fit wound bed daily.  Cover with ABD pad, wrap with Kerlix roll gauze starting just above toes and ending right below knee.  Secure with Ace bandage wrapped in same fashion as Kerlix.   Home infusion instructions   Complete by: As directed    Antibiotic to be given at HD center   Instructions: Other   Comments: Antibiotic to be given at HD center   Increase activity slowly   Complete by: As directed       Allergies as of 03/29/2023       Reactions   Lisinopril Swelling   Lips mouth    Furosemide Cough   Light headed, blurry vision, weakness.   Latex Itching   Adhesive [tape] Itching, Rash        Medication List     STOP taking these medications    cephALEXin 500 MG capsule Commonly known as: KEFLEX   ciprofloxacin 500 MG tablet Commonly known as: CIPRO   doxycycline 100 MG capsule Commonly known as: VIBRAMYCIN   oxyCODONE 5 MG immediate release tablet Commonly known as: Oxy IR/ROXICODONE       TAKE these medications    Accu-Chek Guide test strip Generic drug: glucose blood USE 1 STRIP TOTAL 2 TIMES DAILY FOR 90 DAYS   acetaminophen 325 MG tablet Commonly known as: TYLENOL Take 2 tablets (650 mg total) by mouth every 6 (six) hours as needed for mild pain (or Fever >/= 101).   allopurinol 100 MG tablet Commonly known as: ZYLOPRIM Take 100 mg by mouth daily.   amLODipine 10 MG tablet Commonly known as: NORVASC Take 5 mg by mouth daily as needed (if bp is 190/90 or higher).   apixaban 5 MG Tabs tablet Commonly known as: ELIQUIS Take 1 tablet (5 mg total) by mouth 2 (two) times daily.   atenolol 50 MG tablet Commonly known as: TENORMIN Take 25 mg by mouth daily as needed (if bp is 190/90 or higher).   atorvastatin 80 MG tablet Commonly known as: LIPITOR Take 80 mg by mouth  at bedtime. What changed: Another medication with the same name was removed. Continue taking this medication, and follow the directions you see here.   calcium acetate 667 MG capsule Commonly known as: PHOSLO Take 1,334-2,001 mg by mouth See admin instructions. Take 2001 mg by mouth with each meal & take 1334 mg by mouth with each snack.   ceFAZolin IVPB Commonly known as: ANCEF Inject 2-3 g into the vein Every Tuesday,Thursday,and Saturday with dialysis for 22 days. Cefazolin 2gm IV with HD on Tue and THur and Cefazolin 3gm with HD on Sat Indication:  Streptococcus Group G bacteremia Last Day of  Therapy:  04/20/2023 (last dose 04/19/2023) Labs - Once weekly:  CBC/D and CMP Fax weekly lab results  promptly to [phone] To be given at HD center   cinacalcet 30 MG tablet Commonly known as: SENSIPAR Take 30 mg by mouth daily.   CINNAMON PO Take 500 mg by mouth 2 (two) times daily.   clopidogrel 75 MG tablet Commonly known as: PLAVIX TAKE 1 TABLET BY MOUTH EVERY DAY   Dexcom G7 Sensor Misc USE 1 EACH EVERY 10 (TEN) DAYS   EYE HEALTH PO Take 1 capsule by mouth daily.   Lantus SoloStar 100 UNIT/ML Solostar Pen Generic drug: insulin glargine Inject 20 Units into the skin at bedtime.   lidocaine-prilocaine cream Commonly known as: EMLA Apply 1 application topically as needed (port access).   multivitamin with minerals Tabs tablet Take 1 tablet by mouth 3 (three) times a week. Men's Multivitamin   Rena-Vite Rx 1 MG Tabs Take 1 tablet by mouth 3 (three) times a week.   sildenafil 20 MG tablet Commonly known as: REVATIO Take 1 tablet daily.  Do not take with nitrates.   SYSTANE OP Place 1 drop into both eyes daily as needed (dry eyes).   traZODone 50 MG tablet Commonly known as: DESYREL Take 50 mg by mouth at bedtime.               Home Infusion Instuctions  (From admission, onward)           Start     Ordered   03/29/23 0000  Home infusion  instructions       Comments: Antibiotic to be given at HD center  Question Answer Comment  Instructions Other   Comments Antibiotic to be given at HD center      03/29/23 1016              Discharge Care Instructions  (From admission, onward)           Start     Ordered   03/29/23 0000  Discharge wound care:       Comments: Wound care  Daily      Comments: Clean L anterior foot and L medial leg wounds with NS, apply Silver Hydrofiber [place] [phone] cut to fit wound bed daily.  Cover with ABD pad, wrap with Kerlix roll gauze starting just above toes and ending right below knee.  Secure with Ace bandage wrapped in same fashion as Kerlix.   03/29/23 1419            Follow-up Information     Lynn Ito, MD Follow up on 04/14/2023.   Specialty: Infectious Diseases Why: F/u on 04/14/23 at 11.30AM Contact information: [address] [place] [address] [phone]                Allergies  Allergen Reactions   Lisinopril Swelling    Lips mouth    Furosemide Cough    Light headed, blurry vision, weakness.   Latex Itching   Adhesive [Tape] Itching and Rash    Consultations: Nephro  ID    Procedures/Studies: ECHOCARDIOGRAM COMPLETE  Result Date: 03/27/2023    ECHOCARDIOGRAM REPORT   Patient Name:   PREM JACOBUCCI Date of Exam: 03/27/2023 Medical Rec #:  [id number]     Height:       76.0 in Accession #:    [id number]    Weight:       378.7 lb Date of Birth:  02/11/1971  BSA:          2.906 m Patient Age:    52 years      BP:           147/98 mmHg Patient Gender: M             HR:           68 bpm. Exam Location:  [facility] Procedure: 2D Echo, Cardiac Doppler and Color Doppler Indications:    Bacteremia R78.81  History:        Patient has prior history of Echocardiogram examinations, most                 recent 10/16/2017. COPD and ESRD on dialysis; Peripheral vascular                 disease; Risk Factors:Hypertension, Dyslipidemia, Diabetes and                  Former Smoker.  Sonographer:    Dondra Prader RVT RCS Referring Phys: [id number] Lynn Ito  Sonographer Comments: Technically challenging study due to limited acoustic windows and patient is obese. Image acquisition challenging due to patient body habitus. IMPRESSIONS  1. Left ventricular ejection fraction, by estimation, is 60 to 65%. The left ventricle has normal function. The left ventricle has no regional wall motion abnormalities. The left ventricular internal cavity size was mildly dilated. There is moderate left ventricular hypertrophy. Left ventricular diastolic parameters are consistent with Grade II diastolic dysfunction (pseudonormalization).  2. Right ventricular systolic function is normal. The right ventricular size is normal.  3. Left atrial size was moderately dilated.  4. The mitral valve is normal in structure. Trivial mitral valve regurgitation. No evidence of mitral stenosis.  5. The aortic valve is grossly normal. There is moderate calcification of the aortic valve. There is mild thickening of the aortic valve. Aortic valve regurgitation is not visualized. Aortic valve sclerosis/calcification is present, without any evidence  of aortic stenosis.  6. Aortic dilatation noted. There is borderline dilatation of the ascending aorta, measuring 38 mm.  7. The inferior vena cava is normal in size with greater than 50% respiratory variability, suggesting right atrial pressure of 3 mmHg. Comparison(s): No significant change from prior study. 10/16/17- EF 55-60%. Conclusion(s)/Recommendation(s): No evidence of valvular vegetations on this transthoracic echocardiogram. Consider a transesophageal echocardiogram to exclude infective endocarditis if clinically indicated. FINDINGS  Left Ventricle: Left ventricular ejection fraction, by estimation, is 60 to 65%. The left ventricle has normal function. The left ventricle has no regional wall motion abnormalities. The left ventricular  internal cavity size was mildly dilated. There is  moderate left ventricular hypertrophy. Left ventricular diastolic parameters are consistent with Grade II diastolic dysfunction (pseudonormalization). Right Ventricle: The right ventricular size is normal. Right vetricular wall thickness was not well visualized. Right ventricular systolic function is normal. Left Atrium: Left atrial size was moderately dilated. Right Atrium: Right atrial size was normal in size. Pericardium: There is no evidence of pericardial effusion. Mitral Valve: The mitral valve is normal in structure. There is mild thickening of the mitral valve leaflet(s). There is mild calcification of the mitral valve leaflet(s). Trivial mitral valve regurgitation. No evidence of mitral valve stenosis. Tricuspid Valve: The tricuspid valve is not well visualized. Tricuspid valve regurgitation is trivial. No evidence of tricuspid stenosis. Aortic Valve: The aortic valve is grossly normal. There is moderate calcification of the aortic valve. There is mild thickening of the aortic valve. Aortic valve regurgitation is not  visualized. Aortic valve sclerosis/calcification is present, without any evidence of aortic stenosis. Aortic valve mean gradient measures 6.0 mmHg. Aortic valve peak gradient measures 11.4 mmHg. Aortic valve area, by VTI measures 2.70 cm. Pulmonic Valve: The pulmonic valve was not well visualized. Pulmonic valve regurgitation is trivial. No evidence of pulmonic stenosis. Aorta: Aortic dilatation noted. There is borderline dilatation of the ascending aorta, measuring 38 mm. Venous: The inferior vena cava is normal in size with greater than 50% respiratory variability, suggesting right atrial pressure of 3 mmHg. IAS/Shunts: The atrial septum is grossly normal.  LEFT VENTRICLE PLAX 2D LVIDd:         5.60 cm   Diastology LVIDs:         3.35 cm   LV e' medial:    7.07 cm/s LV PW:         1.50 cm   LV E/e' medial:  16.8 LV IVS:        1.30 cm   LV  e' lateral:   10.70 cm/s LVOT diam:     2.00 cm   LV E/e' lateral: 11.1 LV SV:         100 LV SV Index:   34 LVOT Area:     3.14 cm  RIGHT VENTRICLE             IVC RV S prime:     17.30 cm/s  IVC diam: 2.00 cm TAPSE (M-mode): 2.9 cm LEFT ATRIUM              Index        RIGHT ATRIUM           Index LA diam:        4.30 cm  1.48 cm/m   RA Area:     15.20 cm LA Vol (A2C):   96.8 ml  33.31 ml/m  RA Volume:   38.00 ml  13.08 ml/m LA Vol (A4C):   104.3 ml 35.88 ml/m LA Biplane Vol: 109.0 ml 37.51 ml/m  AORTIC VALVE                     PULMONIC VALVE AV Area (Vmax):    2.64 cm      PV Vmax:       1.01 m/s AV Area (Vmean):   2.68 cm      PV Peak grad:  4.1 mmHg AV Area (VTI):     2.70 cm AV Vmax:           169.00 cm/s AV Vmean:          110.000 cm/s AV VTI:            0.371 m AV Peak Grad:      11.4 mmHg AV Mean Grad:      6.0 mmHg LVOT Vmax:         142.00 cm/s LVOT Vmean:        93.700 cm/s LVOT VTI:          0.319 m LVOT/AV VTI ratio: 0.86  AORTA Ao Root diam: 3.20 cm Ao Asc diam:  3.80 cm MITRAL VALVE MV Area (PHT): 3.72 cm     SHUNTS MV Decel Time: 204 msec     Systemic VTI:  0.32 m MV E velocity: 119.00 cm/s  Systemic Diam: 2.00 cm MV A velocity: 89.30 cm/s MV E/A ratio:  1.33 Jodelle Red MD Electronically signed by Jodelle Red MD Signature Date/Time: 03/27/2023/1:04:52 PM    Final  DG Chest Portable 1 View  Result Date: 03/24/2023 CLINICAL DATA:  Sepsis. EXAM: PORTABLE CHEST 1 VIEW COMPARISON:  None Available. FINDINGS: The heart is mildly enlarged. Mild pulmonary vascular congestion. No focal consolidation or large pleural effusion. No acute osseous abnormality. IMPRESSION: Mild cardiomegaly with mild pulmonary vascular congestion. No focal consolidation or pleural effusion. Electronically Signed   By: Larose Hires D.O.   On: 03/24/2023 10:46   DG Foot Complete Left  Result Date: 03/24/2023 CLINICAL DATA:  Evaluate for osteomyelitis. Patient complains tube wounds to the left  foot. EXAM: LEFT FOOT - COMPLETE 3+ VIEW COMPARISON:  None Available. FINDINGS: There is no evidence of fracture or dislocation. There is no cortical erosion or periosteal reaction to suggest osteomyelitis. Marked soft tissue swelling of the foot and ankle. IMPRESSION: 1. No radiographic evidence of osteomyelitis. Evaluation of early osteomyelitis is limited on radiographs, if there is clinical concern further evaluation with MR examination is suggested. 2. Marked soft tissue swelling of the foot and ankle. Electronically Signed   By: Larose Hires D.O.   On: 03/24/2023 10:44   (Echo, Carotid, EGD, Colonoscopy, ERCP)    Subjective: Pt c/o fatigue    Discharge Exam: Vitals:   03/29/23 1330 03/29/23 1353  BP: (!) 160/69 (!) 161/84  Pulse: (!) 50 (!) 51  Resp: 18 20  Temp:  97.7 F (36.5 C)  SpO2: 97% 98%   Vitals:   03/29/23 1230 03/29/23 1300 03/29/23 1330 03/29/23 1353  BP: (!) 177/72 (!) 165/73 (!) 160/69 (!) 161/84  Pulse: 66 (!) 50 (!) 50 (!) 51  Resp: (!) 21 19 18 20   Temp:    97.7 F (36.5 C)  TempSrc:    Oral  SpO2: 97% 96% 97% 98%  Weight:      Height:        General: Pt is alert, awake, not in acute distress Cardiovascular: S1/S2 +, no rubs, no gallops Respiratory: CTA bilaterally, no wheezing, no rhonchi Abdominal: Soft, NT,obese, bowel sounds + Extremities:  no cyanosis    The results of significant diagnostics from this hospitalization (including imaging, microbiology, ancillary and laboratory) are listed below for reference.     Microbiology: Recent Results (from the past 240 hour(s))  Culture, blood (Routine x 2)     Status: Abnormal   Collection Time: 03/24/23  9:41 AM   Specimen: BLOOD  Result Value Ref Range Status   Specimen Description   Final    BLOOD BLOOD RIGHT ARM Performed at [facility], [address] [place], [place] [address]    Special Requests   Final    BOTTLES DRAWN AEROBIC AND ANAEROBIC Blood Culture results may not be  optimal due to an excessive volume of blood received in culture bottles Performed at [facility], [address] [place], [place] [address]    Culture  Setup Time   Final    Organism ID to follow GRAM POSITIVE COCCI IN BOTH AEROBIC AND ANAEROBIC BOTTLES CRITICAL RESULT CALLED TO, READ BACK BY AND VERIFIED WITH: NATHAN BELEU AT 2141 ON 03/24/23 BY SS Performed at [facility], [address] [place], [place] [address]    Culture STREPTOCOCCUS GROUP G (A)  Final   Report Status 03/27/2023 FINAL  Final   Organism ID, Bacteria STREPTOCOCCUS GROUP G  Final      Susceptibility   Streptococcus group g - MIC*    CLINDAMYCIN RESISTANT Resistant     AMPICILLIN <=0.25 SENSITIVE Sensitive  ERYTHROMYCIN >=8 RESISTANT Resistant     VANCOMYCIN 0.5 SENSITIVE Sensitive     CEFTRIAXONE <=0.12 SENSITIVE Sensitive     LEVOFLOXACIN 0.5 SENSITIVE Sensitive     PENICILLIN <=0.06 SENSITIVE Sensitive     * STREPTOCOCCUS GROUP G  Blood Culture ID Panel (Reflexed)     Status: Abnormal   Collection Time: 03/24/23  9:41 AM  Result Value Ref Range Status   Enterococcus faecalis NOT DETECTED NOT DETECTED Final   Enterococcus Faecium NOT DETECTED NOT DETECTED Final   Listeria monocytogenes NOT DETECTED NOT DETECTED Final   Staphylococcus species NOT DETECTED NOT DETECTED Final   Staphylococcus aureus (BCID) NOT DETECTED NOT DETECTED Final   Staphylococcus epidermidis NOT DETECTED NOT DETECTED Final   Staphylococcus lugdunensis NOT DETECTED NOT DETECTED Final   Streptococcus species DETECTED (A) NOT DETECTED Final    Comment: Not Enterococcus species, Streptococcus agalactiae, Streptococcus pyogenes, or Streptococcus pneumoniae. CRITICAL RESULT CALLED TO, READ BACK BY AND VERIFIED WITH: NATHAN BELUE AT 2141 ON 03/24/23 BY SS    Streptococcus agalactiae NOT DETECTED NOT DETECTED Final   Streptococcus pneumoniae NOT DETECTED NOT DETECTED Final   Streptococcus pyogenes NOT DETECTED NOT  DETECTED Final   A.calcoaceticus-baumannii NOT DETECTED NOT DETECTED Final   Bacteroides fragilis NOT DETECTED NOT DETECTED Final   Enterobacterales NOT DETECTED NOT DETECTED Final   Enterobacter cloacae complex NOT DETECTED NOT DETECTED Final   Escherichia coli NOT DETECTED NOT DETECTED Final   Klebsiella aerogenes NOT DETECTED NOT DETECTED Final   Klebsiella oxytoca NOT DETECTED NOT DETECTED Final   Klebsiella pneumoniae NOT DETECTED NOT DETECTED Final   Proteus species NOT DETECTED NOT DETECTED Final   Salmonella species NOT DETECTED NOT DETECTED Final   Serratia marcescens NOT DETECTED NOT DETECTED Final   Haemophilus influenzae NOT DETECTED NOT DETECTED Final   Neisseria meningitidis NOT DETECTED NOT DETECTED Final   Pseudomonas aeruginosa NOT DETECTED NOT DETECTED Final   Stenotrophomonas maltophilia NOT DETECTED NOT DETECTED Final   Candida albicans NOT DETECTED NOT DETECTED Final   Candida auris NOT DETECTED NOT DETECTED Final   Candida glabrata NOT DETECTED NOT DETECTED Final   Candida krusei NOT DETECTED NOT DETECTED Final   Candida parapsilosis NOT DETECTED NOT DETECTED Final   Candida tropicalis NOT DETECTED NOT DETECTED Final   Cryptococcus neoformans/gattii NOT DETECTED NOT DETECTED Final    Comment: Performed at [facility], [address] [place], [place] [address]  Culture, blood (Routine x 2)     Status: Abnormal   Collection Time: 03/24/23  9:55 AM   Specimen: BLOOD RIGHT ARM  Result Value Ref Range Status   Specimen Description   Final    BLOOD RIGHT ARM Performed at [facility], [address], [place], [place] [address]    Special Requests   Final    BOTTLES DRAWN AEROBIC AND ANAEROBIC Blood Culture adequate volume Performed at [facility], [address] [place], [place] [address]    Culture  Setup Time   Final    GRAM POSITIVE COCCI IN BOTH AEROBIC AND ANAEROBIC BOTTLES CRITICAL VALUE NOTED.  VALUE IS CONSISTENT WITH  PREVIOUSLY REPORTED AND CALLED VALUE. Performed at [facility] [address] [place], [place] [address]    Culture (A)  Final    STREPTOCOCCUS GROUP G SUSCEPTIBILITIES PERFORMED ON PREVIOUS CULTURE WITHIN THE LAST 5 DAYS. Performed at [facility], [address], [place], [place] [address]    Report Status 03/27/2023 FINAL  Final  Resp panel by RT-PCR (RSV, Flu A&B, Covid) Anterior Nasal Swab     Status: None   Collection Time: 03/24/23  9:55 AM   Specimen: Anterior Nasal Swab  Result Value Ref Range Status   SARS Coronavirus 2 by RT PCR NEGATIVE NEGATIVE Final    Comment: (NOTE) SARS-CoV-2 target nucleic acids are NOT DETECTED.  The SARS-CoV-2 RNA is generally detectable in upper respiratory specimens during the acute phase of infection. The lowest concentration of SARS-CoV-2 viral copies this assay can detect is 138 copies/mL. A negative result does not preclude SARS-Cov-2 infection and should not be used as the sole basis for treatment or other patient management decisions. A negative result may occur with  improper specimen collection/handling, submission of specimen other than nasopharyngeal swab, presence of viral mutation(s) within the areas targeted by this assay, and inadequate number of viral copies(<138 copies/mL). A negative result must be combined with clinical observations, patient history, and epidemiological information. The expected result is Negative.  Fact Sheet for Patients:  BloggerCourse.com  Fact Sheet for Healthcare Providers:  SeriousBroker.it  This test is no t yet approved or cleared by the [place] FDA and  has been authorized for detection and/or diagnosis of SARS-CoV-2 by FDA under an Emergency Use Authorization (EUA). This EUA will remain  in effect (meaning this test can be used) for the duration of the COVID-19 declaration under Section 564(b)(1) of the Act,  21 U.S.C.section 360bbb-3(b)(1), unless the authorization is terminated  or revoked sooner.       Influenza A by PCR NEGATIVE NEGATIVE Final   Influenza B by PCR NEGATIVE NEGATIVE Final    Comment: (NOTE) The Xpert Xpress SARS-CoV-2/FLU/RSV plus assay is intended as an aid in the diagnosis of influenza from Nasopharyngeal swab specimens and should not be used as a sole basis for treatment. Nasal washings and aspirates are unacceptable for Xpert Xpress SARS-CoV-2/FLU/RSV testing.  Fact Sheet for Patients: BloggerCourse.com  Fact Sheet for Healthcare Providers: SeriousBroker.it  This test is not yet approved or cleared by the [place] FDA and has been authorized for detection and/or diagnosis of SARS-CoV-2 by FDA under an Emergency Use Authorization (EUA). This EUA will remain in effect (meaning this test can be used) for the duration of the COVID-19 declaration under Section 564(b)(1) of the Act, 21 U.S.C. section 360bbb-3(b)(1), unless the authorization is terminated or revoked.     Resp Syncytial Virus by PCR NEGATIVE NEGATIVE Final    Comment: (NOTE) Fact Sheet for Patients: BloggerCourse.com  Fact Sheet for Healthcare Providers: SeriousBroker.it  This test is not yet approved or cleared by the [place] FDA and has been authorized for detection and/or diagnosis of SARS-CoV-2 by FDA under an Emergency Use Authorization (EUA). This EUA will remain in effect (meaning this test can be used) for the duration of the COVID-19 declaration under Section 564(b)(1) of the Act, 21 U.S.C. section 360bbb-3(b)(1), unless the authorization is terminated or revoked.  Performed at [facility], [address], [place], [place] [address]   Aerobic Culture w Gram Stain (superficial specimen)     Status: None   Collection Time: 03/25/23  6:50 PM   Specimen: Wound   Result Value Ref Range Status   Specimen Description   Final    WOUND Performed at [facility], [address] [place], [place] [address]    Special Requests   Final    NONE Performed at [facility], [address] [place], [place] [address]  Gram Stain   Final    FEW WBC PRESENT, PREDOMINANTLY PMN RARE GRAM POSITIVE RODS RARE GRAM POSITIVE COCCI    Culture   Final    FEW ESCHERICHIA COLI MODERATE CORYNEBACTERIUM SPECIES Standardized susceptibility testing for this organism is not available. Performed at [facility], [address], [place], [place] [address]    Report Status 03/29/2023 FINAL  Final   Organism ID, Bacteria ESCHERICHIA COLI  Final      Susceptibility   Escherichia coli - MIC*    AMPICILLIN 4 SENSITIVE Sensitive     CEFEPIME <=0.12 SENSITIVE Sensitive     CEFTAZIDIME <=1 SENSITIVE Sensitive     CEFTRIAXONE <=0.25 SENSITIVE Sensitive     CIPROFLOXACIN 0.5 INTERMEDIATE Intermediate     GENTAMICIN <=1 SENSITIVE Sensitive     IMIPENEM <=0.25 SENSITIVE Sensitive     TRIMETH/SULFA >=320 RESISTANT Resistant     AMPICILLIN/SULBACTAM <=2 SENSITIVE Sensitive     PIP/TAZO <=4 SENSITIVE Sensitive     * FEW ESCHERICHIA COLI  Culture, blood (Routine X 2) w Reflex to ID Panel     Status: None (Preliminary result)   Collection Time: 03/26/23  5:06 AM   Specimen: BLOOD  Result Value Ref Range Status   Specimen Description BLOOD BLOOD RIGHT ARM  Final   Special Requests   Final    BOTTLES DRAWN AEROBIC AND ANAEROBIC Blood Culture adequate volume   Culture   Final    NO GROWTH 3 DAYS Performed at [facility], [address], [place], [place] [address]    Report Status PENDING  Incomplete  Culture, blood (Routine X 2) w Reflex to ID Panel     Status: None (Preliminary result)   Collection Time: 03/26/23  5:06 AM   Specimen: BLOOD  Result Value Ref Range Status   Specimen Description BLOOD BLOOD RIGHT HAND  Final   Special  Requests   Final    BOTTLES DRAWN AEROBIC ONLY Blood Culture adequate volume   Culture   Final    NO GROWTH 3 DAYS Performed at [facility], [address], [place], [place] [address]    Report Status PENDING  Incomplete     Labs: BNP (last 3 results) No results for input(s): "BNP" in the last 8760 hours. Basic Metabolic Panel: Recent Labs  Lab 03/25/23 0615 03/26/23 0506 03/27/23 0443 03/28/23 0514 03/29/23 0620  NA 136 135 136 138 138  K 4.0 3.8 3.9 4.1 4.1  CL 98 99 99 99 99  CO2 25 24 27 25 24   GLUCOSE 116* 121* 138* 142* 124*  BUN 61* 76* 56* 71* 82*  CREATININE 9.98* 12.26* 9.58* 11.89* 13.30*  CALCIUM 8.9 9.1 9.4 9.4 9.5   Liver Function Tests: Recent Labs  Lab 03/24/23 0941  AST 18  ALT 15  ALKPHOS 56  BILITOT 0.9  PROT 9.1*  ALBUMIN 3.9   No results for input(s): "LIPASE", "AMYLASE" in the last 168 hours. No results for input(s): "AMMONIA" in the last 168 hours. CBC: Recent Labs  Lab 03/24/23 0941 03/25/23 0615 03/26/23 0506 03/27/23 0443 03/28/23 0514 03/29/23 0620  WBC 10.2 15.9* 12.6* 9.9 6.8 5.7  NEUTROABS 9.6*  --   --   --   --   --   HGB 12.4* 10.7* 10.7* 11.2* 10.6* 10.9*  HCT 39.4 33.2* 33.6* 33.9* 32.4* 34.0*  MCV 93.6 92.7 93.3 91.1 92.3 93.2  PLT 163 127* 123* 126* 144* 151   Cardiac Enzymes: No results for  input(s): "CKTOTAL", "CKMB", "CKMBINDEX", "TROPONINI" in the last 168 hours. BNP: Invalid input(s): "POCBNP" CBG: Recent Labs  Lab 03/28/23 1204 03/28/23 1650 03/28/23 2118 03/29/23 0714 03/29/23 0911  GLUCAP 113* 124* 98 112* 97   D-Dimer No results for input(s): "DDIMER" in the last 72 hours. Hgb A1c No results for input(s): "HGBA1C" in the last 72 hours. Lipid Profile No results for input(s): "CHOL", "HDL", "LDLCALC", "TRIG", "CHOLHDL", "LDLDIRECT" in the last 72 hours. Thyroid function studies No results for input(s): "TSH", "T4TOTAL", "T3FREE", "THYROIDAB" in the last 72 hours.  Invalid input(s):  "FREET3" Anemia work up No results for input(s): "VITAMINB12", "FOLATE", "FERRITIN", "TIBC", "IRON", "RETICCTPCT" in the last 72 hours. Urinalysis    Component Value Date/Time   COLORURINE YELLOW (A) 10/03/2017 1120   APPEARANCEUR CLOUDY (A) 10/03/2017 1120   LABSPEC 1.015 10/03/2017 1120   PHURINE 5.0 10/03/2017 1120   GLUCOSEU NEGATIVE 10/03/2017 1120   HGBUR SMALL (A) 10/03/2017 1120   BILIRUBINUR NEGATIVE 10/03/2017 1120   KETONESUR NEGATIVE 10/03/2017 1120   PROTEINUR 30 (A) 10/03/2017 1120   NITRITE NEGATIVE 10/03/2017 1120   LEUKOCYTESUR NEGATIVE 10/03/2017 1120   Sepsis Labs Recent Labs  Lab 03/26/23 0506 03/27/23 0443 03/28/23 0514 03/29/23 0620  WBC 12.6* 9.9 6.8 5.7   Microbiology Recent Results (from the past 240 hour(s))  Culture, blood (Routine x 2)     Status: Abnormal   Collection Time: 03/24/23  9:41 AM   Specimen: BLOOD  Result Value Ref Range Status   Specimen Description   Final    BLOOD BLOOD RIGHT ARM Performed at [facility], [address] [place], [place] [address]    Special Requests   Final    BOTTLES DRAWN AEROBIC AND ANAEROBIC Blood Culture results may not be optimal due to an excessive volume of blood received in culture bottles Performed at [facility], [address] [place], [place] [address]    Culture  Setup Time   Final    Organism ID to follow GRAM POSITIVE COCCI IN BOTH AEROBIC AND ANAEROBIC BOTTLES CRITICAL RESULT CALLED TO, READ BACK BY AND VERIFIED WITH: NATHAN BELEU AT 2141 ON 03/24/23 BY SS Performed at [facility], [address] [place], [place] [address]    Culture STREPTOCOCCUS GROUP G (A)  Final   Report Status 03/27/2023 FINAL  Final   Organism ID, Bacteria STREPTOCOCCUS GROUP G  Final      Susceptibility   Streptococcus group g - MIC*    CLINDAMYCIN RESISTANT Resistant     AMPICILLIN <=0.25 SENSITIVE Sensitive     ERYTHROMYCIN >=8 RESISTANT Resistant     VANCOMYCIN 0.5  SENSITIVE Sensitive     CEFTRIAXONE <=0.12 SENSITIVE Sensitive     LEVOFLOXACIN 0.5 SENSITIVE Sensitive     PENICILLIN <=0.06 SENSITIVE Sensitive     * STREPTOCOCCUS GROUP G  Blood Culture ID Panel (Reflexed)     Status: Abnormal   Collection Time: 03/24/23  9:41 AM  Result Value Ref Range Status   Enterococcus faecalis NOT DETECTED NOT DETECTED Final   Enterococcus Faecium NOT DETECTED NOT DETECTED Final   Listeria monocytogenes NOT DETECTED NOT DETECTED Final   Staphylococcus species NOT DETECTED NOT DETECTED Final   Staphylococcus aureus (BCID) NOT DETECTED NOT DETECTED Final   Staphylococcus epidermidis NOT DETECTED NOT DETECTED Final   Staphylococcus lugdunensis NOT DETECTED NOT DETECTED Final   Streptococcus species DETECTED (A) NOT DETECTED Final    Comment: Not Enterococcus species, Streptococcus agalactiae, Streptococcus pyogenes, or Streptococcus  pneumoniae. CRITICAL RESULT CALLED TO, READ BACK BY AND VERIFIED WITH: NATHAN BELUE AT 2141 ON 03/24/23 BY SS    Streptococcus agalactiae NOT DETECTED NOT DETECTED Final   Streptococcus pneumoniae NOT DETECTED NOT DETECTED Final   Streptococcus pyogenes NOT DETECTED NOT DETECTED Final   A.calcoaceticus-baumannii NOT DETECTED NOT DETECTED Final   Bacteroides fragilis NOT DETECTED NOT DETECTED Final   Enterobacterales NOT DETECTED NOT DETECTED Final   Enterobacter cloacae complex NOT DETECTED NOT DETECTED Final   Escherichia coli NOT DETECTED NOT DETECTED Final   Klebsiella aerogenes NOT DETECTED NOT DETECTED Final   Klebsiella oxytoca NOT DETECTED NOT DETECTED Final   Klebsiella pneumoniae NOT DETECTED NOT DETECTED Final   Proteus species NOT DETECTED NOT DETECTED Final   Salmonella species NOT DETECTED NOT DETECTED Final   Serratia marcescens NOT DETECTED NOT DETECTED Final   Haemophilus influenzae NOT DETECTED NOT DETECTED Final   Neisseria meningitidis NOT DETECTED NOT DETECTED Final   Pseudomonas aeruginosa NOT DETECTED NOT  DETECTED Final   Stenotrophomonas maltophilia NOT DETECTED NOT DETECTED Final   Candida albicans NOT DETECTED NOT DETECTED Final   Candida auris NOT DETECTED NOT DETECTED Final   Candida glabrata NOT DETECTED NOT DETECTED Final   Candida krusei NOT DETECTED NOT DETECTED Final   Candida parapsilosis NOT DETECTED NOT DETECTED Final   Candida tropicalis NOT DETECTED NOT DETECTED Final   Cryptococcus neoformans/gattii NOT DETECTED NOT DETECTED Final    Comment: Performed at [facility], [address] [place], [place] [address]  Culture, blood (Routine x 2)     Status: Abnormal   Collection Time: 03/24/23  9:55 AM   Specimen: BLOOD RIGHT ARM  Result Value Ref Range Status   Specimen Description   Final    BLOOD RIGHT ARM Performed at [facility], [address], [place], [place] [address]    Special Requests   Final    BOTTLES DRAWN AEROBIC AND ANAEROBIC Blood Culture adequate volume Performed at [facility], [address] [place], [place] [address]    Culture  Setup Time   Final    GRAM POSITIVE COCCI IN BOTH AEROBIC AND ANAEROBIC BOTTLES CRITICAL VALUE NOTED.  VALUE IS CONSISTENT WITH PREVIOUSLY REPORTED AND CALLED VALUE. Performed at [facility], [address] [place], [place] [address]    Culture (A)  Final    STREPTOCOCCUS GROUP G SUSCEPTIBILITIES PERFORMED ON PREVIOUS CULTURE WITHIN THE LAST 5 DAYS. Performed at [facility], [address], [place], [place] [address]    Report Status 03/27/2023 FINAL  Final  Resp panel by RT-PCR (RSV, Flu A&B, Covid) Anterior Nasal Swab     Status: None   Collection Time: 03/24/23  9:55 AM   Specimen: Anterior Nasal Swab  Result Value Ref Range Status   SARS Coronavirus 2 by RT PCR NEGATIVE NEGATIVE Final    Comment: (NOTE) SARS-CoV-2 target nucleic acids are NOT DETECTED.  The SARS-CoV-2 RNA is generally detectable in upper respiratory specimens during the acute phase of infection.  The lowest concentration of SARS-CoV-2 viral copies this assay can detect is 138 copies/mL. A negative result does not preclude SARS-Cov-2 infection and should not be used as the sole basis for treatment or other patient management decisions. A negative result may occur with  improper specimen collection/handling, submission of specimen other than nasopharyngeal swab, presence of viral mutation(s) within the areas targeted by this assay, and inadequate number of viral copies(<138 copies/mL). A negative result must be combined  with clinical observations, patient history, and epidemiological information. The expected result is Negative.  Fact Sheet for Patients:  BloggerCourse.com  Fact Sheet for Healthcare Providers:  SeriousBroker.it  This test is no t yet approved or cleared by the [place] FDA and  has been authorized for detection and/or diagnosis of SARS-CoV-2 by FDA under an Emergency Use Authorization (EUA). This EUA will remain  in effect (meaning this test can be used) for the duration of the COVID-19 declaration under Section 564(b)(1) of the Act, 21 U.S.C.section 360bbb-3(b)(1), unless the authorization is terminated  or revoked sooner.       Influenza A by PCR NEGATIVE NEGATIVE Final   Influenza B by PCR NEGATIVE NEGATIVE Final    Comment: (NOTE) The Xpert Xpress SARS-CoV-2/FLU/RSV plus assay is intended as an aid in the diagnosis of influenza from Nasopharyngeal swab specimens and should not be used as a sole basis for treatment. Nasal washings and aspirates are unacceptable for Xpert Xpress SARS-CoV-2/FLU/RSV testing.  Fact Sheet for Patients: BloggerCourse.com  Fact Sheet for Healthcare Providers: SeriousBroker.it  This test is not yet approved or cleared by the [place] FDA and has been authorized for detection and/or diagnosis of SARS-CoV-2 by FDA  under an Emergency Use Authorization (EUA). This EUA will remain in effect (meaning this test can be used) for the duration of the COVID-19 declaration under Section 564(b)(1) of the Act, 21 U.S.C. section 360bbb-3(b)(1), unless the authorization is terminated or revoked.     Resp Syncytial Virus by PCR NEGATIVE NEGATIVE Final    Comment: (NOTE) Fact Sheet for Patients: BloggerCourse.com  Fact Sheet for Healthcare Providers: SeriousBroker.it  This test is not yet approved or cleared by the [place] FDA and has been authorized for detection and/or diagnosis of SARS-CoV-2 by FDA under an Emergency Use Authorization (EUA). This EUA will remain in effect (meaning this test can be used) for the duration of the COVID-19 declaration under Section 564(b)(1) of the Act, 21 U.S.C. section 360bbb-3(b)(1), unless the authorization is terminated or revoked.  Performed at [facility], [address] [place], [place] [address]   Aerobic Culture w Gram Stain (superficial specimen)     Status: None   Collection Time: 03/25/23  6:50 PM   Specimen: Wound  Result Value Ref Range Status   Specimen Description   Final    WOUND Performed at [facility], [address] [place], [place] [address]    Special Requests   Final    NONE Performed at [facility], [address] [place], [place] [address]    Gram Stain   Final    FEW WBC PRESENT, PREDOMINANTLY PMN RARE GRAM POSITIVE RODS RARE GRAM POSITIVE COCCI    Culture   Final    FEW ESCHERICHIA COLI MODERATE CORYNEBACTERIUM SPECIES Standardized susceptibility testing for this organism is not available. Performed at [facility], [address], [place], [place] [address]    Report Status 03/29/2023 FINAL  Final   Organism ID, Bacteria ESCHERICHIA COLI  Final      Susceptibility   Escherichia coli - MIC*    AMPICILLIN 4 SENSITIVE Sensitive      CEFEPIME <=0.12 SENSITIVE Sensitive     CEFTAZIDIME <=1 SENSITIVE Sensitive     CEFTRIAXONE <=0.25 SENSITIVE Sensitive     CIPROFLOXACIN 0.5 INTERMEDIATE Intermediate     GENTAMICIN <=1 SENSITIVE Sensitive     IMIPENEM <=0.25 SENSITIVE Sensitive     TRIMETH/SULFA >=320 RESISTANT Resistant  AMPICILLIN/SULBACTAM <=2 SENSITIVE Sensitive     PIP/TAZO <=4 SENSITIVE Sensitive     * FEW ESCHERICHIA COLI  Culture, blood (Routine X 2) w Reflex to ID Panel     Status: None (Preliminary result)   Collection Time: 03/26/23  5:06 AM   Specimen: BLOOD  Result Value Ref Range Status   Specimen Description BLOOD BLOOD RIGHT ARM  Final   Special Requests   Final    BOTTLES DRAWN AEROBIC AND ANAEROBIC Blood Culture adequate volume   Culture   Final    NO GROWTH 3 DAYS Performed at [facility], [address], [place], [place] [address]    Report Status PENDING  Incomplete  Culture, blood (Routine X 2) w Reflex to ID Panel     Status: None (Preliminary result)   Collection Time: 03/26/23  5:06 AM   Specimen: BLOOD  Result Value Ref Range Status   Specimen Description BLOOD BLOOD RIGHT HAND  Final   Special Requests   Final    BOTTLES DRAWN AEROBIC ONLY Blood Culture adequate volume   Culture   Final    NO GROWTH 3 DAYS Performed at [facility], [address] [place], [place] [address]    Report Status PENDING  Incomplete     Time coordinating discharge: Over 30 minutes  SIGNED:   Charise Killian, MD  [facility] 03/29/2023, 2:20 PM Pager   If 7PM-7AM, please contact night-coverage [email or web address]

## 2023-03-28 NOTE — Progress Notes (Signed)
PROGRESS NOTE    [name]  ZOX:[id number] DOB: 11/18/1970 DOA: 03/24/2023 PCP: Marina Goodell, MD   Assessment & Plan:   Principal Problem:   Sepsis [facility]) Active Problems:   Leg wound, left   Hypertension   End stage renal disease (HCC)   Type 1 diabetes mellitus (HCC)   Gout   HLD (hyperlipidemia)   COPD (chronic obstructive pulmonary disease) (HCC)   PVD (peripheral vascular disease) (HCC)   Obesity, Class III, BMI 40-49.9 (morbid obesity) (HCC)  Assessment and Plan: Sepsis: met criteria w/ fever, tachypnea, leukocytosis & possible left leg wound. Continue on IV rocephin as per ID. Sepsis resolved   Possible bacteremia: blood cx growing group G strept . Continue on IV rocephin as per ID. Echo shows EF 60-65%, grade II diastolic dysfunction, no regional wall motion abnormalities & no evidence of valvular vegetations. Repeat blood cxs NGTD. ID recs TEE. Pt is unsure of TEE and wants to speak w/ ID   Left leg wound: continue w/ wound care. Continue on abxs    HTN: continue on amlodipine & restart home atenolol     ESRD: on HD TTS. Nephro following and recs apprec    DM2: HbA1c 8.8, poorly controlled. Continue on glargine, SSI w/ accuchecks    Gout: continue on home dose of allopurinol    HLD: continue on statin    COPD: w/o exacerbation. Bronchodilators prn. Encourage incentive spirometry    PVD: continue plavix, statin, eliquis   Morbid obesity: BMI 47.0. Complicates overall care & prognosis   Thrombocytopenia: etiology unclear. Labile   ACD: likely secondary to ESRD. Will transfuse if Hb < 7.0      DVT prophylaxis: eliquis  Code Status:  full  Family Communication:  Disposition Plan: likely d/c back home   Level of care: Med-Surg Status is: Inpatient Remains inpatient appropriate because: requiring IV abxs     Consultants:  ID Nephro   Procedures:   Antimicrobials: rocpehin    Subjective: Pt c/o malaise    Objective: Vitals:    03/27/23 1740 03/27/23 2031 03/28/23 0453 03/28/23 0842  BP: (!) 157/79 (!) 158/76 (!) 155/77 (!) 159/80  Pulse: (!) 56 62 60 (!) 59  Resp: 18 20 18 18   Temp: 99.2 F (37.3 C) 98.3 F (36.8 C) 97.8 F (36.6 C) 97.7 F (36.5 C)  TempSrc: Oral  Oral Oral  SpO2: 98% 97% 94% 94%  Weight:      Height:        Intake/Output Summary (Last 24 hours) at 03/28/2023 0859 Last data filed at 03/28/2023 0039 Gross per 24 hour  Intake 817.42 ml  Output --  Net 817.42 ml   Filed Weights   03/24/23 0931 03/26/23 0756 03/26/23 1225  Weight: (!) 175.4 kg (!) 173.8 kg (!) 171.8 kg    Examination:  General exam: Appears comfortable. Morbid obesity  Respiratory system: diminished breath sounds b/l otherwise clear  Cardiovascular system: S1/S2+. No rubs or clicks  Gastrointestinal system: abd is soft, NT, obese & normal bowel sounds  Central nervous system: alert & oriented. Moves all extremities  Psychiatry: judgement and insight appears normal. Appropriate mood and affect     Data Reviewed: I have personally reviewed following labs and imaging studies  CBC: Recent Labs  Lab 03/24/23 0941 03/25/23 0615 03/26/23 0506 03/27/23 0443 03/28/23 0514  WBC 10.2 15.9* 12.6* 9.9 6.8  NEUTROABS 9.6*  --   --   --   --   HGB 12.4* 10.7*  10.7* 11.2* 10.6*  HCT 39.4 33.2* 33.6* 33.9* 32.4*  MCV 93.6 92.7 93.3 91.1 92.3  PLT 163 127* 123* 126* 144*   Basic Metabolic Panel: Recent Labs  Lab 03/24/23 0941 03/25/23 0615 03/26/23 0506 03/27/23 0443 03/28/23 0514  NA 135 136 135 136 138  K 3.6 4.0 3.8 3.9 4.1  CL 97* 98 99 99 99  CO2 27 25 24 27 25   GLUCOSE 137* 116* 121* 138* 142*  BUN 43* 61* 76* 56* 71*  CREATININE 7.85* 9.98* 12.26* 9.58* 11.89*  CALCIUM 9.1 8.9 9.1 9.4 9.4   GFR: Estimated Creatinine Clearance: 12.3 mL/min (A) (by C-G formula based on SCr of 11.89 mg/dL (H)). Liver Function Tests: Recent Labs  Lab 03/24/23 0941  AST 18  ALT 15  ALKPHOS 56  BILITOT 0.9  PROT  9.1*  ALBUMIN 3.9   No results for input(s): "LIPASE", "AMYLASE" in the last 168 hours. No results for input(s): "AMMONIA" in the last 168 hours. Coagulation Profile: Recent Labs  Lab 03/24/23 0941  INR 1.0   Cardiac Enzymes: No results for input(s): "CKTOTAL", "CKMB", "CKMBINDEX", "TROPONINI" in the last 168 hours. BNP (last 3 results) No results for input(s): "PROBNP" in the last 8760 hours. HbA1C: No results for input(s): "HGBA1C" in the last 72 hours. CBG: Recent Labs  Lab 03/27/23 0829 03/27/23 1208 03/27/23 1730 03/27/23 2140 03/28/23 0812  GLUCAP 122* 165* 104* 157* 114*   Lipid Profile: No results for input(s): "CHOL", "HDL", "LDLCALC", "TRIG", "CHOLHDL", "LDLDIRECT" in the last 72 hours. Thyroid Function Tests: No results for input(s): "TSH", "T4TOTAL", "FREET4", "T3FREE", "THYROIDAB" in the last 72 hours. Anemia Panel: No results for input(s): "VITAMINB12", "FOLATE", "FERRITIN", "TIBC", "IRON", "RETICCTPCT" in the last 72 hours. Sepsis Labs: Recent Labs  Lab 03/24/23 0941 03/24/23 1229  PROCALCITON 1.13  --   LATICACIDVEN 1.9 1.5    Recent Results (from the past 240 hour(s))  Culture, blood (Routine x 2)     Status: Abnormal   Collection Time: 03/24/23  9:41 AM   Specimen: BLOOD  Result Value Ref Range Status   Specimen Description   Final    BLOOD BLOOD RIGHT ARM Performed at [facility], [address] [place], [place] [address]    Special Requests   Final    BOTTLES DRAWN AEROBIC AND ANAEROBIC Blood Culture results may not be optimal due to an excessive volume of blood received in culture bottles Performed at [facility], [address] [place], [place] [address]    Culture  Setup Time   Final    Organism ID to follow GRAM POSITIVE COCCI IN BOTH AEROBIC AND ANAEROBIC BOTTLES CRITICAL RESULT CALLED TO, READ BACK BY AND VERIFIED WITH: NATHAN BELEU AT 2141 ON 03/24/23 BY SS Performed at [id number], [address] [place], [place] [address]    Culture STREPTOCOCCUS GROUP G (A)  Final   Report Status 03/27/2023 FINAL  Final   Organism ID, Bacteria STREPTOCOCCUS GROUP G  Final      Susceptibility   Streptococcus group g - MIC*    CLINDAMYCIN RESISTANT Resistant     AMPICILLIN <=0.25 SENSITIVE Sensitive     ERYTHROMYCIN >=8 RESISTANT Resistant     VANCOMYCIN 0.5 SENSITIVE Sensitive     CEFTRIAXONE <=0.12 SENSITIVE Sensitive     LEVOFLOXACIN 0.5 SENSITIVE Sensitive     PENICILLIN <=0.06 SENSITIVE Sensitive     * STREPTOCOCCUS GROUP G  Blood Culture ID Panel (Reflexed)     Status:  Abnormal   Collection Time: 03/24/23  9:41 AM  Result Value Ref Range Status   Enterococcus faecalis NOT DETECTED NOT DETECTED Final   Enterococcus Faecium NOT DETECTED NOT DETECTED Final   Listeria monocytogenes NOT DETECTED NOT DETECTED Final   Staphylococcus species NOT DETECTED NOT DETECTED Final   Staphylococcus aureus (BCID) NOT DETECTED NOT DETECTED Final   Staphylococcus epidermidis NOT DETECTED NOT DETECTED Final   Staphylococcus lugdunensis NOT DETECTED NOT DETECTED Final   Streptococcus species DETECTED (A) NOT DETECTED Final    Comment: Not Enterococcus species, Streptococcus agalactiae, Streptococcus pyogenes, or Streptococcus pneumoniae. CRITICAL RESULT CALLED TO, READ BACK BY AND VERIFIED WITH: NATHAN BELUE AT 2141 ON 03/24/23 BY SS    Streptococcus agalactiae NOT DETECTED NOT DETECTED Final   Streptococcus pneumoniae NOT DETECTED NOT DETECTED Final   Streptococcus pyogenes NOT DETECTED NOT DETECTED Final   A.calcoaceticus-baumannii NOT DETECTED NOT DETECTED Final   Bacteroides fragilis NOT DETECTED NOT DETECTED Final   Enterobacterales NOT DETECTED NOT DETECTED Final   Enterobacter cloacae complex NOT DETECTED NOT DETECTED Final   Escherichia coli NOT DETECTED NOT DETECTED Final   Klebsiella aerogenes NOT DETECTED NOT DETECTED Final   Klebsiella oxytoca NOT DETECTED NOT DETECTED Final    Klebsiella pneumoniae NOT DETECTED NOT DETECTED Final   Proteus species NOT DETECTED NOT DETECTED Final   Salmonella species NOT DETECTED NOT DETECTED Final   Serratia marcescens NOT DETECTED NOT DETECTED Final   Haemophilus influenzae NOT DETECTED NOT DETECTED Final   Neisseria meningitidis NOT DETECTED NOT DETECTED Final   Pseudomonas aeruginosa NOT DETECTED NOT DETECTED Final   Stenotrophomonas maltophilia NOT DETECTED NOT DETECTED Final   Candida albicans NOT DETECTED NOT DETECTED Final   Candida auris NOT DETECTED NOT DETECTED Final   Candida glabrata NOT DETECTED NOT DETECTED Final   Candida krusei NOT DETECTED NOT DETECTED Final   Candida parapsilosis NOT DETECTED NOT DETECTED Final   Candida tropicalis NOT DETECTED NOT DETECTED Final   Cryptococcus neoformans/gattii NOT DETECTED NOT DETECTED Final    Comment: Performed at [facility], [address] [place], [place] [address]  Culture, blood (Routine x 2)     Status: Abnormal   Collection Time: 03/24/23  9:55 AM   Specimen: BLOOD RIGHT ARM  Result Value Ref Range Status   Specimen Description   Final    BLOOD RIGHT ARM Performed at [facility], [address], [place], [place] [address]    Special Requests   Final    BOTTLES DRAWN AEROBIC AND ANAEROBIC Blood Culture adequate volume Performed at [facility], [address] [place], [place] [address]    Culture  Setup Time   Final    GRAM POSITIVE COCCI IN BOTH AEROBIC AND ANAEROBIC BOTTLES CRITICAL VALUE NOTED.  VALUE IS CONSISTENT WITH PREVIOUSLY REPORTED AND CALLED VALUE. Performed at [facility], [address] [place], [place] [address]    Culture (A)  Final    STREPTOCOCCUS GROUP G SUSCEPTIBILITIES PERFORMED ON PREVIOUS CULTURE WITHIN THE LAST 5 DAYS. Performed at [facility], [address] [place], [place] [address]    Report Status 03/27/2023 FINAL  Final  Resp panel by RT-PCR (RSV, Flu A&B, Covid) Anterior  Nasal Swab     Status: None   Collection Time: 03/24/23  9:55 AM   Specimen: Anterior Nasal Swab  Result Value Ref Range Status   SARS Coronavirus 2 by RT PCR NEGATIVE NEGATIVE Final    Comment: (NOTE) SARS-CoV-2 target  nucleic acids are NOT DETECTED.  The SARS-CoV-2 RNA is generally detectable in upper respiratory specimens during the acute phase of infection. The lowest concentration of SARS-CoV-2 viral copies this assay can detect is 138 copies/mL. A negative result does not preclude SARS-Cov-2 infection and should not be used as the sole basis for treatment or other patient management decisions. A negative result may occur with  improper specimen collection/handling, submission of specimen other than nasopharyngeal swab, presence of viral mutation(s) within the areas targeted by this assay, and inadequate number of viral copies(<138 copies/mL). A negative result must be combined with clinical observations, patient history, and epidemiological information. The expected result is Negative.  Fact Sheet for Patients:  BloggerCourse.com  Fact Sheet for Healthcare Providers:  SeriousBroker.it  This test is no t yet approved or cleared by the [place] FDA and  has been authorized for detection and/or diagnosis of SARS-CoV-2 by FDA under an Emergency Use Authorization (EUA). This EUA will remain  in effect (meaning this test can be used) for the duration of the COVID-19 declaration under Section 564(b)(1) of the Act, 21 U.S.C.section 360bbb-3(b)(1), unless the authorization is terminated  or revoked sooner.       Influenza A by PCR NEGATIVE NEGATIVE Final   Influenza B by PCR NEGATIVE NEGATIVE Final    Comment: (NOTE) The Xpert Xpress SARS-CoV-2/FLU/RSV plus assay is intended as an aid in the diagnosis of influenza from Nasopharyngeal swab specimens and should not be used as a sole basis for treatment. Nasal washings  and aspirates are unacceptable for Xpert Xpress SARS-CoV-2/FLU/RSV testing.  Fact Sheet for Patients: BloggerCourse.com  Fact Sheet for Healthcare Providers: SeriousBroker.it  This test is not yet approved or cleared by the [place] FDA and has been authorized for detection and/or diagnosis of SARS-CoV-2 by FDA under an Emergency Use Authorization (EUA). This EUA will remain in effect (meaning this test can be used) for the duration of the COVID-19 declaration under Section 564(b)(1) of the Act, 21 U.S.C. section 360bbb-3(b)(1), unless the authorization is terminated or revoked.     Resp Syncytial Virus by PCR NEGATIVE NEGATIVE Final    Comment: (NOTE) Fact Sheet for Patients: BloggerCourse.com  Fact Sheet for Healthcare Providers: SeriousBroker.it  This test is not yet approved or cleared by the [place] FDA and has been authorized for detection and/or diagnosis of SARS-CoV-2 by FDA under an Emergency Use Authorization (EUA). This EUA will remain in effect (meaning this test can be used) for the duration of the COVID-19 declaration under Section 564(b)(1) of the Act, 21 U.S.C. section 360bbb-3(b)(1), unless the authorization is terminated or revoked.  Performed at [facility], [address], [place], [place] [address]   Aerobic Culture w Gram Stain (superficial specimen)     Status: None (Preliminary result)   Collection Time: 03/25/23  6:50 PM   Specimen: Wound  Result Value Ref Range Status   Specimen Description   Final    WOUND Performed at [facility], [address] [place], [place] [address]    Special Requests   Final    NONE Performed at [facility], [address] [place], [place] [address]    Gram Stain   Final    FEW WBC PRESENT, PREDOMINANTLY PMN RARE GRAM POSITIVE RODS RARE GRAM POSITIVE COCCI    Culture    Final    CULTURE REINCUBATED FOR BETTER GROWTH Performed at [facility], [address], [place], [place] [address]  Report Status PENDING  Incomplete  Culture, blood (Routine X 2) w Reflex to ID Panel     Status: None (Preliminary result)   Collection Time: 03/26/23  5:06 AM   Specimen: BLOOD  Result Value Ref Range Status   Specimen Description BLOOD BLOOD RIGHT ARM  Final   Special Requests   Final    BOTTLES DRAWN AEROBIC AND ANAEROBIC Blood Culture adequate volume   Culture   Final    NO GROWTH 2 DAYS Performed at [facility], [address] [place], [place] [address]    Report Status PENDING  Incomplete  Culture, blood (Routine X 2) w Reflex to ID Panel     Status: None (Preliminary result)   Collection Time: 03/26/23  5:06 AM   Specimen: BLOOD  Result Value Ref Range Status   Specimen Description BLOOD BLOOD RIGHT HAND  Final   Special Requests   Final    BOTTLES DRAWN AEROBIC ONLY Blood Culture adequate volume   Culture   Final    NO GROWTH 2 DAYS Performed at [facility], [address] [place], [place] [address]    Report Status PENDING  Incomplete         Radiology Studies: ECHOCARDIOGRAM COMPLETE  Result Date: 03/27/2023    ECHOCARDIOGRAM REPORT   Patient Name:   [name] Date of Exam: 03/27/2023 Medical Rec #:  [id number]     Height:       76.0 in Accession #:    [id number]    Weight:       378.7 lb Date of Birth:  [birth year]     BSA:          2.906 m Patient Age:    52 years      BP:           147/98 mmHg Patient Gender: M             HR:           68 bpm. Exam Location:  [facility] Procedure: 2D Echo, Cardiac Doppler and Color Doppler Indications:    Bacteremia R78.81  History:        Patient has prior history of Echocardiogram examinations, most                 recent 10/16/2017. COPD and ESRD on dialysis; Peripheral vascular                 disease; Risk Factors:Hypertension, Dyslipidemia, Diabetes and                 Former Smoker.   Sonographer:    Dondra Prader RVT RCS Referring Phys: [id number] Lynn Ito  Sonographer Comments: Technically challenging study due to limited acoustic windows and patient is obese. Image acquisition challenging due to patient body habitus. IMPRESSIONS  1. Left ventricular ejection fraction, by estimation, is 60 to 65%. The left ventricle has normal function. The left ventricle has no regional wall motion abnormalities. The left ventricular internal cavity size was mildly dilated. There is moderate left ventricular hypertrophy. Left ventricular diastolic parameters are consistent with Grade II diastolic dysfunction (pseudonormalization).  2. Right ventricular systolic function is normal. The right ventricular size is normal.  3. Left atrial size was moderately dilated.  4. The mitral valve is normal in structure. Trivial mitral valve regurgitation. No evidence of mitral stenosis.  5. The aortic valve is grossly normal. There is moderate calcification of the aortic valve. There is mild thickening of  the aortic valve. Aortic valve regurgitation is not visualized. Aortic valve sclerosis/calcification is present, without any evidence  of aortic stenosis.  6. Aortic dilatation noted. There is borderline dilatation of the ascending aorta, measuring 38 mm.  7. The inferior vena cava is normal in size with greater than 50% respiratory variability, suggesting right atrial pressure of 3 mmHg. Comparison(s): No significant change from prior study. 10/16/17- EF 55-60%. Conclusion(s)/Recommendation(s): No evidence of valvular vegetations on this transthoracic echocardiogram. Consider a transesophageal echocardiogram to exclude infective endocarditis if clinically indicated. FINDINGS  Left Ventricle: Left ventricular ejection fraction, by estimation, is 60 to 65%. The left ventricle has normal function. The left ventricle has no regional wall motion abnormalities. The left ventricular internal cavity size was mildly  dilated. There is  moderate left ventricular hypertrophy. Left ventricular diastolic parameters are consistent with Grade II diastolic dysfunction (pseudonormalization). Right Ventricle: The right ventricular size is normal. Right vetricular wall thickness was not well visualized. Right ventricular systolic function is normal. Left Atrium: Left atrial size was moderately dilated. Right Atrium: Right atrial size was normal in size. Pericardium: There is no evidence of pericardial effusion. Mitral Valve: The mitral valve is normal in structure. There is mild thickening of the mitral valve leaflet(s). There is mild calcification of the mitral valve leaflet(s). Trivial mitral valve regurgitation. No evidence of mitral valve stenosis. Tricuspid Valve: The tricuspid valve is not well visualized. Tricuspid valve regurgitation is trivial. No evidence of tricuspid stenosis. Aortic Valve: The aortic valve is grossly normal. There is moderate calcification of the aortic valve. There is mild thickening of the aortic valve. Aortic valve regurgitation is not visualized. Aortic valve sclerosis/calcification is present, without any evidence of aortic stenosis. Aortic valve mean gradient measures 6.0 mmHg. Aortic valve peak gradient measures 11.4 mmHg. Aortic valve area, by VTI measures 2.70 cm. Pulmonic Valve: The pulmonic valve was not well visualized. Pulmonic valve regurgitation is trivial. No evidence of pulmonic stenosis. Aorta: Aortic dilatation noted. There is borderline dilatation of the ascending aorta, measuring 38 mm. Venous: The inferior vena cava is normal in size with greater than 50% respiratory variability, suggesting right atrial pressure of 3 mmHg. IAS/Shunts: The atrial septum is grossly normal.  LEFT VENTRICLE PLAX 2D LVIDd:         5.60 cm   Diastology LVIDs:         3.35 cm   LV e' medial:    7.07 cm/s LV PW:         1.50 cm   LV E/e' medial:  16.8 LV IVS:        1.30 cm   LV e' lateral:   10.70 cm/s LVOT  diam:     2.00 cm   LV E/e' lateral: 11.1 LV SV:         100 LV SV Index:   34 LVOT Area:     3.14 cm  RIGHT VENTRICLE             IVC RV S prime:     17.30 cm/s  IVC diam: 2.00 cm TAPSE (M-mode): 2.9 cm LEFT ATRIUM              Index        RIGHT ATRIUM           Index LA diam:        4.30 cm  1.48 cm/m   RA Area:     15.20 cm LA Vol (A2C):   96.8 ml  33.31  ml/m  RA Volume:   38.00 ml  13.08 ml/m LA Vol (A4C):   104.3 ml 35.88 ml/m LA Biplane Vol: 109.0 ml 37.51 ml/m  AORTIC VALVE                     PULMONIC VALVE AV Area (Vmax):    2.64 cm      PV Vmax:       1.01 m/s AV Area (Vmean):   2.68 cm      PV Peak grad:  4.1 mmHg AV Area (VTI):     2.70 cm AV Vmax:           169.00 cm/s AV Vmean:          110.000 cm/s AV VTI:            0.371 m AV Peak Grad:      11.4 mmHg AV Mean Grad:      6.0 mmHg LVOT Vmax:         142.00 cm/s LVOT Vmean:        93.700 cm/s LVOT VTI:          0.319 m LVOT/AV VTI ratio: 0.86  AORTA Ao Root diam: 3.20 cm Ao Asc diam:  3.80 cm MITRAL VALVE MV Area (PHT): 3.72 cm     SHUNTS MV Decel Time: 204 msec     Systemic VTI:  0.32 m MV E velocity: 119.00 cm/s  Systemic Diam: 2.00 cm MV A velocity: 89.30 cm/s MV E/A ratio:  1.33 Jodelle Red MD Electronically signed by Jodelle Red MD Signature Date/Time: 03/27/2023/1:04:52 PM    Final         Scheduled Meds:  allopurinol  100 mg Oral Daily   amLODipine  5 mg Oral Daily   apixaban  5 mg Oral BID   atorvastatin  80 mg Oral QHS   calcium acetate  2,001 mg Oral TID WC   Chlorhexidine Gluconate Cloth  6 each Topical Q0600   cinacalcet  30 mg Oral Q breakfast   clopidogrel  75 mg Oral Daily   epoetin (EPOGEN/PROCRIT) injection  4,000 Units Intravenous Q T,Th,Sa-HD   feeding supplement (NEPRO CARB STEADY)  237 mL Oral BID BM   insulin aspart  0-5 Units Subcutaneous QHS   insulin aspart  0-9 Units Subcutaneous TID WC   insulin glargine-yfgn  15 Units Subcutaneous QHS   multivitamin  1 tablet Oral Once per  day on Monday Wednesday Friday   traZODone  50 mg Oral QHS   Continuous Infusions:  sodium chloride Stopped (03/27/23 2339)   cefTRIAXone (ROCEPHIN)  IV Stopped (03/27/23 2155)     LOS: 4 days    Time spent: 25 mins     Charise Killian, MD [facility] Pager [phone]  If 7PM-7AM, please contact night-coverage [email or web address] 03/28/2023, 8:59 AM

## 2023-03-28 NOTE — Care Management Important Message (Signed)
Important Message  Patient Details  Name: [name] MRN: [id number] Date of Birth: [birth year]   Medicare Important Message Given:  Yes     Bernadette Hoit 03/28/2023, 3:32 PM

## 2023-03-28 NOTE — Progress Notes (Signed)
   [facility] has been requested to perform a transesophageal echocardiogram on [name] for bacteremia.  After careful review of history and examination, the risks and benefits of transesophageal echocardiogram have been explained including risks of esophageal damage, perforation (1:10,000 risk), bleeding, pharyngeal hematoma as well as other potential complications associated with conscious sedation including aspiration, arrhythmia, respiratory failure and death. Alternatives to treatment were discussed, questions were answered. Patient is willing to proceed and is scheduled for 7/30 @ 7:30 AM w/ Dr. Mariah Milling.  Nicolasa Ducking, NP  03/28/2023 3:40 PM

## 2023-03-29 ENCOUNTER — Encounter: Payer: Self-pay | Admitting: Internal Medicine

## 2023-03-29 ENCOUNTER — Other Ambulatory Visit: Payer: Self-pay | Admitting: *Deleted

## 2023-03-29 ENCOUNTER — Encounter
Admission: EM | Disposition: A | Discharge status: Home Health | Payer: Self-pay | Source: Ambulatory Visit | Attending: Internal Medicine

## 2023-03-29 ENCOUNTER — Inpatient Hospital Stay: Payer: Medicare HMO | Admitting: Anesthesiology

## 2023-03-29 ENCOUNTER — Inpatient Hospital Stay: Admit: 2023-03-29 | Payer: Medicare HMO

## 2023-03-29 DIAGNOSIS — I38 Endocarditis, valve unspecified: Secondary | ICD-10-CM

## 2023-03-29 DIAGNOSIS — I340 Nonrheumatic mitral (valve) insufficiency: Secondary | ICD-10-CM | POA: Diagnosis not present

## 2023-03-29 DIAGNOSIS — R7881 Bacteremia: Secondary | ICD-10-CM | POA: Diagnosis not present

## 2023-03-29 HISTORY — PX: TEE WITHOUT CARDIOVERSION: SHX5443

## 2023-03-29 LAB — GLUCOSE, CAPILLARY
Glucose-Capillary: 112 mg/dL — ABNORMAL HIGH (ref 70–99)
Glucose-Capillary: 97 mg/dL (ref 70–99)
Glucose-Capillary: 114 mg/dL — ABNORMAL HIGH (ref 70–99)

## 2023-03-29 LAB — ECHO TEE

## 2023-03-29 SURGERY — TRANSESOPHAGEAL ECHOCARDIOGRAM (TEE)
Anesthesia: General

## 2023-03-29 MED ORDER — PROPOFOL 10 MG/ML IV BOLUS
INTRAVENOUS | Status: AC
  Filled 2023-03-29: qty 40

## 2023-03-29 MED ORDER — PROPOFOL 10 MG/ML IV BOLUS
INTRAVENOUS | Status: DC | PRN
  Administered 2023-03-29: 50 mg via INTRAVENOUS
  Administered 2023-03-29 (×3): 20 mg via INTRAVENOUS

## 2023-03-29 MED ORDER — BUTAMBEN-TETRACAINE-BENZOCAINE 2-2-14 % EX AERO
INHALATION_SPRAY | CUTANEOUS | Status: AC
  Filled 2023-03-29: qty 5

## 2023-03-29 MED ORDER — LIDOCAINE VISCOUS HCL 2 % MT SOLN
OROMUCOSAL | Status: AC
  Filled 2023-03-29: qty 15

## 2023-03-29 MED ORDER — EPOETIN ALFA 4000 UNIT/ML IJ SOLN
INTRAMUSCULAR | Status: AC
  Filled 2023-03-29: qty 1

## 2023-03-29 MED ORDER — CEFAZOLIN IV (FOR PTA / DISCHARGE USE ONLY): 2.0000 g | INTRAVENOUS | Status: AC

## 2023-03-29 MED ORDER — DEXMEDETOMIDINE HCL IN NACL 80 MCG/20ML IV SOLN
INTRAVENOUS | Status: DC | PRN
  Administered 2023-03-29: 12 ug via INTRAVENOUS

## 2023-03-29 NOTE — TOC Transition Note (Signed)
Transition of Care [facility]) - CM/SW Discharge Note   Patient Details  Name: [name] MRN: [id number] Date of Birth: Feb 25, 1971  Transition of Care [facility]) CM/SW Contact:  Truddie Hidden, RN Phone Number: 03/29/2023, 2:40 PM   Clinical Narrative:    Patient discharging home [name] from [place] notified.    TOC signing off   Final next level of care: Home w Home Health Services Barriers to Discharge: Barriers Resolved   Patient Goals and CMS Choice      Discharge Placement                      Patient and family notified of of transfer: 03/29/23  Discharge Plan and Services Additional resources added to the After Visit Summary for   In-house Referral: [facility] (SEPSIS)                        HH Arranged: RN HH Agency: [facility] Date [facility] Agency Contacted: 03/29/23 Time HH Agency Contacted: 1440 Representative spoke with at [facility] Agency: Kandee Keen  Social Determinants of Health (SDOH) Interventions SDOH Screenings   Food Insecurity: No Food Insecurity (03/24/2023)  Housing: Low Risk  (03/24/2023)  Transportation Needs: No Transportation Needs (03/24/2023)  Utilities: Not At Risk (03/24/2023)  Tobacco Use: Medium Risk (03/29/2023)     Readmission Risk Interventions     No data to display

## 2023-03-29 NOTE — Anesthesia Preprocedure Evaluation (Signed)
Anesthesia Evaluation  Patient identified by MRN, date of birth, ID band Patient awake    Reviewed: Allergy & Precautions, H&P , NPO status , Patient's Chart, lab work & pertinent test results  History of Anesthesia Complications Negative for: history of anesthetic complications  Airway Mallampati: III  TM Distance: >3 FB Neck ROM: full   Comment: Large neck Dental  (+) Chipped, Poor Dentition, Missing   Pulmonary shortness of breath, sleep apnea , pneumonia, resolved, former smoker Persistent cough since COVID 09/27/22   Pulmonary exam normal  + decreased breath sounds      Cardiovascular Exercise Tolerance: Poor hypertension, Pt. on medications (-) angina + Peripheral Vascular Disease (LE gangrene; on Plavix and warfarin) and + Orthopnea  (-) Past MI Normal cardiovascular exam(-) dysrhythmias  Rhythm:Regular Rate:Normal  ECG 11/10/22: normal   Neuro/Psych negative neurological ROS  negative psych ROS   GI/Hepatic negative GI ROS, Neg liver ROS,neg GERD  ,,  Endo/Other  diabetes, Type 2, Insulin Dependent  Morbid obesityClass 3 obesity; pituitary microadenoma; hyperprolactinemia  Renal/GU ESRF and DialysisRenal diseaseLast dialyzed Saturday. Potassium WNL today     Musculoskeletal   Abdominal  (+) + obese  Peds  Hematology  (+) Blood dyscrasia, anemia   Anesthesia Other Findings Past Medical History: No date: Anemia No date: Chronic kidney disease     Comment:  14% FUNCTION No date: Diabetes mellitus without complication (HCC) No date: Dyspnea     Comment:  DOE No date: Dysrhythmia No date: End stage renal disease (HCC) No date: Hypertension No date: Lymphedema of leg No date: Metabolic encephalopathy     Comment:  error No date: Microalbuminuria No date: Obesity No date: Sleep apnea     Comment:  no CPAP No date: Vitamin D deficiency  Past Surgical History: 10/24/2018: A/V FISTULAGRAM; Right      Comment:  Procedure: A/V FISTULAGRAM;  Surgeon: Renford Dills, MD;  Location: [facility];  Service:               Cardiovascular;  Laterality: Right; 11/24/2018: A/V FISTULAGRAM; Right     Comment:  Procedure: A/V FISTULAGRAM;  Surgeon: Renford Dills, MD;  Location: [facility];  Service:               Cardiovascular;  Laterality: Right; 07/31/2019: A/V FISTULAGRAM; Left     Comment:  Procedure: A/V FISTULAGRAM;  Surgeon: Renford Dills, MD;  Location: [facility];  Service:               Cardiovascular;  Laterality: Left; 12/11/2019: A/V FISTULAGRAM; Left     Comment:  Procedure: A/V FISTULAGRAM;  Surgeon: Renford Dills, MD;  Location: [facility];  Service:               Cardiovascular;  Laterality: Left; 02/21/2019: A/V SHUNT INTERVENTION; Right     Comment:  Procedure: A/V SHUNT INTERVENTION;  Surgeon: Renford Dills, MD;  Location: [facility];  Service:  Cardiovascular;  Laterality: Right; 10/03/2017: APPLICATION OF WOUND VAC; Left     Comment:  Procedure: APPLICATION OF WOUND VAC;  Surgeon: Annice Needy, MD;  Location: [facility];  Service: General;                Laterality: Left; 10/11/2017: APPLICATION OF WOUND VAC; Left     Comment:  Procedure: APPLICATION OF WOUND VAC;  Surgeon: Renford Dills, MD;  Location: [facility];  Service: Vascular;                Laterality: Left; 10/14/2017: APPLICATION OF WOUND VAC; Left     Comment:  Procedure: WOUND VAC CHANGE;  Surgeon: Renford Dills, MD;  Location: [facility];  Service: Vascular;                Laterality: Left;  left lower leg 10/18/2017: APPLICATION OF WOUND VAC; Left     Comment:  Procedure: WOUND VAC CHANGE;  Surgeon: Renford Dills, MD;  Location: [facility];  Service: Vascular;                Laterality:  Left; 10/07/2017: APPLICATION OF WOUND VAC; Left     Comment:  Procedure: APPLICATION OF WOUND VAC;  Surgeon: Renford Dills, MD;  Location: [facility];  Service: Vascular;                Laterality: Left; 07/19/2018: AV FISTULA INSERTION W/ RF MAGNETIC GUIDANCE; Right     Comment:  Procedure: AV FISTULA INSERTION W/RF MAGNETIC GUIDANCE;               Surgeon: Renford Dills, MD;  Location: [facility]              CV LAB;  Service: Cardiovascular;  Laterality: Right; 05/11/2019: AV FISTULA PLACEMENT; Left     Comment:  Procedure: ARTERIOVENOUS (AV) FISTULA CREATION               (RADIOCEPHALIC );  Surgeon: Renford Dills, MD;                Location: [facility];  Service: Vascular;  Laterality:               Left; 11/01/2018: CATARACT EXTRACTION W/PHACO; Left     Comment:  Procedure: CATARACT EXTRACTION PHACO AND INTRAOCULAR               LENS PLACEMENT (IOC)-LEFT, DIABETIC-INSULIN DEPENDENT;                Surgeon: Nevada Crane, MD;  Location: [facility];                Service: Ophthalmology;  Laterality: Left;  [place]               00:41.8 CDE 4.61 Fluid Pack Lot # [id number] H 04/27/2019: CATARACT EXTRACTION W/PHACO; Right     Comment:  Procedure: CATARACT EXTRACTION PHACO AND INTRAOCULAR               LENS PLACEMENT (IOC);  Surgeon: Nevada Crane, MD;  Location: [facility];  Service: Ophthalmology;  Laterality:              Right;  [place] 00:34 CDE 1.97 FLUID PACK LOT # [id number]  02/23/2019: DIALYSIS/PERMA CATHETER INSERTION; N/A     Comment:  Procedure: DIALYSIS/PERMA CATHETER INSERTION;  Surgeon:               Annice Needy, MD;  Location: [facility];                Service: Cardiovascular;  Laterality: N/A; 10/11/2017: I & D EXTREMITY; Left     Comment:  Procedure: IRRIGATION AND DEBRIDEMENT EXTREMITY;                Surgeon: Renford Dills, MD;  Location: [facility];                Service: Vascular;  Laterality: Left; 10/07/2017:  INCISION AND DRAINAGE ABSCESS; Right     Comment:  Procedure: INCISION AND DRAINAGE ABSCESS;  Surgeon:               Renford Dills, MD;  Location: [facility];  Service:               Vascular;  Laterality: Right; 10/03/2017: IRRIGATION AND DEBRIDEMENT ABSCESS; Left     Comment:  Procedure: IRRIGATION AND DEBRIDEMENT ABSCESS with               debridement of skin, soft tissue, muscle 50sq cm;                Surgeon: Annice Needy, MD;  Location: [facility];  Service:              General;  Laterality: Left; 02/20/2019: TEMPORARY DIALYSIS CATHETER     Comment:  Procedure: TEMPORARY DIALYSIS CATHETER;  Surgeon:               Renford Dills, MD;  Location: [facility];               Service: Cardiovascular;; 09/18/2019: UPPER EXTREMITY ANGIOGRAPHY; Left     Comment:  Procedure: UPPER EXTREMITY ANGIOGRAPHY;  Surgeon:               Renford Dills, MD;  Location: [facility];               Service: Cardiovascular;  Laterality: Left;  BMI    Body Mass Index: 47.47 kg/m      Reproductive/Obstetrics negative OB ROS                             Anesthesia Physical Anesthesia Plan  ASA: 4  Anesthesia Plan: General   Post-op Pain Management: Minimal or no pain anticipated   Induction: Intravenous  PONV Risk Score and Plan: 2 and Propofol infusion, TIVA and Ondansetron  Airway Management Planned: Nasal Cannula  Additional Equipment: None  Intra-op Plan:   Post-operative Plan:   Informed Consent: I have reviewed the patients History and Physical, chart, labs and discussed the procedure including the risks, benefits and alternatives for the proposed anesthesia with the patient or authorized representative who has indicated his/her understanding and acceptance.     Dental advisory given  Plan Discussed with: CRNA and Surgeon  Anesthesia Plan Comments: (Discussed risks of anesthesia with patient, including possibility of difficulty  with spontaneous ventilation under anesthesia necessitating airway intervention, PONV, and rare risks  such as cardiac or respiratory or neurological events, and allergic reactions. Discussed the role of CRNA in patient's perioperative care. Patient understands. Patient informed about increased incidence of above perioperative risk due to high BMI. Patient understands.  )        Anesthesia Quick Evaluation

## 2023-03-29 NOTE — Transfer of Care (Signed)
Immediate Anesthesia Transfer of Care Note  Patient: [name]  Procedure(s) Performed: TRANSESOPHAGEAL ECHOCARDIOGRAM  Patient Location: PACU and Nursing Unit  Anesthesia Type:General  Level of Consciousness: drowsy and patient cooperative  Airway & Oxygen Therapy: Patient Spontanous Breathing and Patient connected to nasal cannula oxygen  Post-op Assessment: Report given to RN and Post -op Vital signs reviewed and stable  Post vital signs: Reviewed and stable  Last Vitals:  Vitals Value Taken Time  BP 119/77 03/29/23 0808  Temp    Pulse 56 03/29/23 0808  Resp 18 03/29/23 0808  SpO2 100 % 03/29/23 0808  Vitals shown include unfiled device data.  Last Pain:  Vitals:   03/29/23 0711  TempSrc: Oral  PainSc: 0-No pain      Patients Stated Pain Goal: 0 (03/28/23 0954)  Complications: No notable events documented.

## 2023-03-29 NOTE — Progress Notes (Signed)
Transesophageal Echocardiogram :  Indication: Bacteremia, rule out endocarditis Requesting/ordering  physician:   Procedure: Benzocaine spray x2 and 2 mls x 2 of viscous lidocaine were given orally to provide local anesthesia to the oropharynx. The patient was positioned supine on the left side, bite block provided. The patient was moderately sedated with the doses of versed and fentanyl as detailed below.  Using digital technique an omniplane probe was advanced into the distal esophagus without incident.   Moderate sedation: 1. Sedation used: Provided by anesthesia team  See report in EPIC  for complete details: In brief,  No vegetation concerning for endocarditis Moderate LVH transgastric imaging revealed normal LV function with no RWMAs and no mural apical thrombus.  .  Estimated ejection fraction was 55%.  Right sided cardiac chambers were normal with no evidence of pulmonary hypertension.  Imaging of the septum showed no ASD or VSD Bubble study was negative for shunt 2D and color flow confirmed no PFO  Mild MR, trivial TR  The LA was well visualized in orthogonal views.  There was no spontaneous contrast and no thrombus in the LA and LA appendage   The descending thoracic aorta had no  mural aortic debris with no evidence of aneurysmal dilation or disection   [name] 03/29/2023 8:11 AM

## 2023-03-29 NOTE — Discharge Planning (Signed)
ESTABLISHED HEMODIALYSIS [facility]  [address] [place], [place] [address] [phone]   Scheduled days: Tuesday Thursday and Saturday   Treatment time: 7:00am  Clinic is aware of IV antibiotics, records sent.  Dimas Chyle Dialysis Coordinator II  [facility] Cell: [phone] eFax: [phone] [email or web address]

## 2023-03-29 NOTE — Progress Notes (Signed)
[facility]  PROGRESS NOTE   Subjective:   Patient seen and evaluated during dialysis   HEMODIALYSIS FLOWSHEET:  Blood Flow Rate (mL/min): 400 mL/min Arterial Pressure (mmHg): -190 mmHg Venous Pressure (mmHg): 290 mmHg TMP (mmHg): 7 mmHg Ultrafiltration Rate (mL/min): 971 mL/min Dialysate Flow Rate (mL/min): 300 ml/min Dialysis Fluid Bolus: Normal Saline  Seen after TEE, mentating well Denies any pain or discomfort   Objective:  Vital signs: Blood pressure (!) 152/75, pulse (!) 47, temperature 97.7 F (36.5 C), temperature source Oral, resp. rate 19, height 6\' 4"  (1.93 m), weight (!) 175.2 kg, SpO2 94%.  Intake/Output Summary (Last 24 hours) at 03/29/2023 1200 Last data filed at 03/29/2023 9562 Gross per 24 hour  Intake 50 ml  Output 1000 ml  Net -950 ml   Filed Weights   03/26/23 1225 03/29/23 0711 03/29/23 0958  Weight: (!) 171.8 kg (!) 171.8 kg (!) 175.2 kg     Physical Exam: General:  No acute distress  Head:  Normocephalic, atraumatic. Moist oral mucosal membranes  Eyes:  Anicteric  Lungs:   Clear to auscultation, normal effort  Heart:  S1S2 no rubs  Abdomen:   Soft, nontender, bowel sounds present  Extremities:  No peripheral edema.  Left lower leg wrapped  Neurologic:  Awake, alert, following commands  Skin:  No lesions  Access: Left arm AV graft      Basic Metabolic Panel: Recent Labs  Lab 03/25/23 0615 03/26/23 0506 03/27/23 0443 03/28/23 0514 03/29/23 0620  NA 136 135 136 138 138  K 4.0 3.8 3.9 4.1 4.1  CL 98 99 99 99 99  CO2 25 24 27 25 24   GLUCOSE 116* 121* 138* 142* 124*  BUN 61* 76* 56* 71* 82*  CREATININE 9.98* 12.26* 9.58* 11.89* 13.30*  CALCIUM 8.9 9.1 9.4 9.4 9.5   GFR: Estimated Creatinine Clearance: 11.1 mL/min (A) (by C-G formula based on SCr of 13.3 mg/dL (H)).  Liver Function Tests: Recent Labs  Lab 03/24/23 0941  AST 18  ALT 15  ALKPHOS 56  BILITOT 0.9  PROT 9.1*  ALBUMIN 3.9   No results for  input(s): "LIPASE", "AMYLASE" in the last 168 hours. No results for input(s): "AMMONIA" in the last 168 hours.  CBC: Recent Labs  Lab 03/24/23 0941 03/25/23 0615 03/26/23 0506 03/27/23 0443 03/28/23 0514 03/29/23 0620  WBC 10.2 15.9* 12.6* 9.9 6.8 5.7  NEUTROABS 9.6*  --   --   --   --   --   HGB 12.4* 10.7* 10.7* 11.2* 10.6* 10.9*  HCT 39.4 33.2* 33.6* 33.9* 32.4* 34.0*  MCV 93.6 92.7 93.3 91.1 92.3 93.2  PLT 163 127* 123* 126* 144* 151     HbA1C: Hgb A1c MFr Bld  Date/Time Value Ref Range Status  09/27/2022 08:35 PM 8.8 (H) 4.8 - 5.6 % Final    Comment:    (NOTE) Pre diabetes:          5.7%-6.4%  Diabetes:              >6.4%  Glycemic control for   <7.0% adults with diabetes   02/23/2019 04:32 AM 8.3 (H) 4.8 - 5.6 % Final    Comment:    (NOTE)         Prediabetes: 5.7 - 6.4         Diabetes: >6.4         Glycemic control for adults with diabetes: <7.0     Urinalysis: No results for input(s): "COLORURINE", "LABSPEC", "  PHURINE", "GLUCOSEU", "HGBUR", "BILIRUBINUR", "KETONESUR", "PROTEINUR", "UROBILINOGEN", "NITRITE", "LEUKOCYTESUR" in the last 72 hours.  Invalid input(s): "APPERANCEUR"    Imaging: No results found.   Medications:    sodium chloride Stopped (03/27/23 2339)    ceFAZolin (ANCEF) IV     [START ON 03/31/2023]  ceFAZolin (ANCEF) IV     [START ON 04/02/2023]  ceFAZolin (ANCEF) IV      allopurinol  100 mg Oral Daily   amLODipine  5 mg Oral Daily   apixaban  5 mg Oral BID   atenolol  25 mg Oral Daily   atorvastatin  80 mg Oral QHS   calcium acetate  2,001 mg Oral TID WC   Chlorhexidine Gluconate Cloth  6 each Topical Q0600   cinacalcet  30 mg Oral Q breakfast   clopidogrel  75 mg Oral Daily   epoetin (EPOGEN/PROCRIT) injection  4,000 Units Intravenous Q T,Th,Sa-HD   feeding supplement (NEPRO CARB STEADY)  237 mL Oral BID BM   insulin aspart  0-5 Units Subcutaneous QHS   insulin aspart  0-9 Units Subcutaneous TID WC   insulin glargine-yfgn   15 Units Subcutaneous QHS   multivitamin  1 tablet Oral Once per day on Monday Wednesday Friday   traZODone  50 mg Oral QHS    Assessment/ Plan:     [name] is a 52 y.o.  male  with medical history significant of ESRD-HD (TTS), HTN. HJLD, DM, COPD, PVD on Eliquis, gout, obesity, chronic ulcer in left leg and foot, who presents with fever, chills, weakness.    Principal Problem:   Sepsis (HCC) Active Problems:   Hypertension   End stage renal disease (HCC)   Obesity, Class III, BMI 40-49.9 (morbid obesity) (HCC)   Type 1 diabetes mellitus (HCC)   PVD (peripheral vascular disease) (HCC)   Gout   HLD (hyperlipidemia)   COPD (chronic obstructive pulmonary disease) (HCC)   Leg wound, left  #1: End-stage renal disease on hemodialysis: Patient has been on TTS schedule for dialysis.  Receiving treatment today, UF 2-2.5L as tolerated. Next treatment scheduled for Thursday.  #2: Anemia with chronic kidney disease: Hemoglobin stable, 10.9.  Patient receives Mircera at outpatient clinic.    #3: Diabetes mellitus type II with chronic kidney disease: insulin dependent. Home regimen includes Lantus.  Sliding scale insulin managed by primary team   #4: Sepsis/chronic leg wounds: Receiving IV ceftriaxone. TEE completed today and found negative. Will continue IV Cefazolin with dialysis outpatient on a 2g/2g/3g schedule until 04/20/23.   #5: Secondary Hyperparathyroidism: with outpatient labs: PTH 165, phosphorus 5.4, calcium 9.1 on 03/08/23.   Lab Results  Component Value Date   CALCIUM 9.5 03/29/2023   CAION 1.14 (L) 11/12/2022   PHOS 6.6 (H) 11/25/2022    Patient prescribed calcium acetate and Sensipar outpatient. Receiving these medications     LOS: 5 [facility] 7/30/202412:00 PM

## 2023-03-29 NOTE — Progress Notes (Signed)
The patient has been discharged. IV has been removed. Education has been completed with the patient and wound care has been provided.

## 2023-03-29 NOTE — Progress Notes (Signed)
*  PRELIMINARY RESULTS* Echocardiogram Echocardiogram Transesophageal has been performed.  Cristela Blue 03/29/2023, 8:15 AM

## 2023-03-29 NOTE — Plan of Care (Signed)

## 2023-03-29 NOTE — Progress Notes (Signed)
Hemodialysis note  Received patient in bed to unit. Alert and oriented.  Informed consent signed and in chart.  Treatment initiated: 1001 Treatment completed: 1353  Patient tolerated well. Transported back to room, alert without acute distress.  Report given to patient's RN.   Access used: LUA AVG Access issues: none  Total UF removed: 2.5 L Medication(s) given:  Epogen 4000 units IV, Ancef 2g IV  Post HD weight: 172.6 kg   [name]

## 2023-03-29 NOTE — Consult Note (Addendum)
[facility] Customer service manager [facility]) Accountable Care Organization (ACO) [facility] Liaison Note  03/29/2023  [name] 1970-10-19 [id number]  Primary Care Provider:  Dr. Juanetta Gosling Gavin [name])  Patient is currently active with Care Management for chronic disease management services.  Patient has been engaged by a care coordinator.  Our community based plan of care has focused on disease management and community resource support.   Patient will receive a post hospital call and will be evaluated for assessments and disease process education.   Plan: discharge disposition pending  Inpatient Transition Of Care [TOC] team member to make aware that Care Management following.  Of note, Care Management services does not replace or interfere with any services that are needed or arranged by inpatient [facility] care management team.       For questions contact:   [name] Cousin, RN, [facility] Liaison [place]   Population Health Office Hours MTWF  8:00 am-6:00 pm Off on Thursday [phone] mobile [phone] [Office toll free line] Office Hours are M-F 8:30 - 5 pm 24 hour nurse advise line [phone] Concierge  [name]

## 2023-03-29 NOTE — TOC Progression Note (Signed)
Transition of Care [facility]) - Progression Note    Patient Details  Name: [name] MRN: [id number] Date of Birth: 04/14/1971  Transition of Care [facility]) CM/SW Contact  Truddie Hidden, RN Phone Number: 03/29/2023, 10:58 AM  Clinical Narrative:    TOC assessing for ongoing needs and discharge planning.   Expected Discharge Plan: Home w Home Health Services    Expected Discharge Plan and Services In-house Referral: [facility] ([facility])                                             Social Determinants of Health (SDOH) Interventions SDOH Screenings   Food Insecurity: No Food Insecurity (03/24/2023)  Housing: Low Risk  (03/24/2023)  Transportation Needs: No Transportation Needs (03/24/2023)  Utilities: Not At Risk (03/24/2023)  Tobacco Use: Medium Risk (03/29/2023)    Readmission Risk Interventions     No data to display

## 2023-03-30 ENCOUNTER — Encounter (HOSPITAL_BASED_OUTPATIENT_CLINIC_OR_DEPARTMENT_OTHER): Payer: Medicare HMO | Admitting: Internal Medicine

## 2023-03-30 ENCOUNTER — Telehealth: Payer: Self-pay | Admitting: *Deleted

## 2023-03-30 ENCOUNTER — Encounter: Payer: Self-pay | Admitting: Cardiovascular Disease

## 2023-03-30 DIAGNOSIS — Z794 Long term (current) use of insulin: Secondary | ICD-10-CM | POA: Diagnosis not present

## 2023-03-30 DIAGNOSIS — Z992 Dependence on renal dialysis: Secondary | ICD-10-CM | POA: Diagnosis not present

## 2023-03-30 DIAGNOSIS — I87312 Chronic venous hypertension (idiopathic) with ulcer of left lower extremity: Secondary | ICD-10-CM | POA: Diagnosis not present

## 2023-03-30 DIAGNOSIS — E11621 Type 2 diabetes mellitus with foot ulcer: Secondary | ICD-10-CM | POA: Diagnosis not present

## 2023-03-30 DIAGNOSIS — L97522 Non-pressure chronic ulcer of other part of left foot with fat layer exposed: Secondary | ICD-10-CM | POA: Diagnosis not present

## 2023-03-30 DIAGNOSIS — L97822 Non-pressure chronic ulcer of other part of left lower leg with fat layer exposed: Secondary | ICD-10-CM

## 2023-03-30 DIAGNOSIS — I87323 Chronic venous hypertension (idiopathic) with inflammation of bilateral lower extremity: Secondary | ICD-10-CM | POA: Diagnosis not present

## 2023-03-30 DIAGNOSIS — E1140 Type 2 diabetes mellitus with diabetic neuropathy, unspecified: Secondary | ICD-10-CM | POA: Diagnosis not present

## 2023-03-30 DIAGNOSIS — Z79899 Other long term (current) drug therapy: Secondary | ICD-10-CM | POA: Diagnosis not present

## 2023-03-30 DIAGNOSIS — Z7984 Long term (current) use of oral hypoglycemic drugs: Secondary | ICD-10-CM | POA: Diagnosis not present

## 2023-03-30 DIAGNOSIS — E11622 Type 2 diabetes mellitus with other skin ulcer: Secondary | ICD-10-CM

## 2023-03-30 DIAGNOSIS — I120 Hypertensive chronic kidney disease with stage 5 chronic kidney disease or end stage renal disease: Secondary | ICD-10-CM | POA: Diagnosis not present

## 2023-03-30 DIAGNOSIS — I890 Lymphedema, not elsewhere classified: Secondary | ICD-10-CM | POA: Diagnosis not present

## 2023-03-30 DIAGNOSIS — N186 End stage renal disease: Secondary | ICD-10-CM | POA: Diagnosis not present

## 2023-03-30 DIAGNOSIS — E1122 Type 2 diabetes mellitus with diabetic chronic kidney disease: Secondary | ICD-10-CM | POA: Diagnosis not present

## 2023-03-30 NOTE — Patient Outreach (Signed)
  Care Coordination   Follow Up Visit Note   04/01/2023 Name: [name] MRN: [id number] DOB: 1970-10-20  [name] is a 52 y.o. year old male who sees [name], [name] Guthrie, MD for primary care. I spoke with  [name] by phone today.  What matters to the patients health and wellness today?  Complete healing of leg wound    Goals Addressed             This Visit's Progress    Effective management of DM   On track    Care Coordination Interventions: Provided education to patient about basic DM disease process Reviewed medications with patient and discussed importance of medication adherence Counseled on importance of regular laboratory monitoring as prescribed Advised patient, providing education and rationale, to check cbg daily and record, calling provider for findings outside established parameters Report he is adherent to HD on Tuesdays, Thursdays, and Saturdays       Effective wound healing of leg wound   On track    Care Coordination Interventions: Evaluation of current treatment plan related to leg wound and patient's adherence to plan as established by provider Advised patient to call [facility] agency to discuss concerns for treatment (regarding availability of supplies) Reviewed scheduled/upcoming provider appointments including HH wound care nursing visit on Mondays and Fridays, wound care office visits every Wednesday Discussed plans with patient for ongoing care management follow up and provided patient with direct contact information for care management team         SDOH assessments and interventions completed:  No     Care Coordination Interventions:  Yes, provided   Interventions Today    Flowsheet Row Most Recent Value  Chronic Disease   Chronic disease during today's visit Diabetes, Chronic Kidney Disease/End Stage Renal Disease (ESRD), Other  [sepsis caused by leg wound]  General Interventions   General Interventions Discussed/Reviewed General  Interventions Reviewed, Labs, Doctor Visits  Labs Hgb A1c every 3 months  [most recent A1C down to 6.8]  Doctor Visits Discussed/Reviewed Doctor Visits Reviewed, PCP, Specialist  [Wound clinic every Wednesday, [facility] nursing every Monday and Friday, HD every Tuesday, Thursday, Saturdays.  ID on 8/15, reminded to call PCP office for hospital follow up]  PCP/Specialist Visits Compliance with follow-up visit  Education Interventions   Education Provided Provided Education  Provided Verbal Education On Other, Medication, Blood Sugar Monitoring, When to see the doctor  [Discussed signs of recurrent infection/sepsis. Will have antibiotics during every HD treatment over the next few weeks. Continues to montior blood sugar daily, today was 117]       Follow up plan: Follow up call scheduled for 8/19    Encounter Outcome:  Pt. Visit Completed   [name] Durie, RN, MSN, [facility] Care Management Care Management Coordinator [phone]

## 2023-03-30 NOTE — Anesthesia Postprocedure Evaluation (Signed)
Anesthesia Post Note  Patient: Ramirez A Sanker  Procedure(s) Performed: TRANSESOPHAGEAL ECHOCARDIOGRAM  Patient location during evaluation: Specials Recovery Anesthesia Type: General Level of consciousness: awake and alert Pain management: pain level controlled Vital Signs Assessment: post-procedure vital signs reviewed and stable Respiratory status: spontaneous breathing, nonlabored ventilation, respiratory function stable and patient connected to nasal cannula oxygen Cardiovascular status: blood pressure returned to baseline and stable Postop Assessment: no apparent nausea or vomiting Anesthetic complications: no   No notable events documented.   Last Vitals:  Vitals:   03/29/23 1353 03/29/23 1510  BP: (!) 161/84 (!) 158/96  Pulse: (!) 51 (!) 56  Resp: 20 20  Temp: 36.5 C (!) 36.3 C  SpO2: 98% 93%    Last Pain:  Vitals:   03/29/23 1510  TempSrc: Oral  PainSc:                  Corinda Gubler

## 2023-03-31 DIAGNOSIS — L03115 Cellulitis of right lower limb: Secondary | ICD-10-CM | POA: Diagnosis not present

## 2023-03-31 DIAGNOSIS — L03116 Cellulitis of left lower limb: Secondary | ICD-10-CM | POA: Diagnosis not present

## 2023-03-31 DIAGNOSIS — N186 End stage renal disease: Secondary | ICD-10-CM | POA: Diagnosis not present

## 2023-03-31 DIAGNOSIS — Z992 Dependence on renal dialysis: Secondary | ICD-10-CM | POA: Diagnosis not present

## 2023-03-31 DIAGNOSIS — R7881 Bacteremia: Secondary | ICD-10-CM | POA: Diagnosis not present

## 2023-03-31 DIAGNOSIS — N2581 Secondary hyperparathyroidism of renal origin: Secondary | ICD-10-CM | POA: Diagnosis not present

## 2023-03-31 DIAGNOSIS — L97929 Non-pressure chronic ulcer of unspecified part of left lower leg with unspecified severity: Secondary | ICD-10-CM | POA: Diagnosis not present

## 2023-03-31 NOTE — Progress Notes (Addendum)
KIYOSHI, SCHAAB A ([id number]) [id number] Page 1 of 11 Visit Report for 03/30/2023 Arrival Information Details Patient Name: Date of Service: [name], CO [place] A. 03/30/2023 8:30 A M Medical Record Number: [id number] Patient Account Number: [id number] Date of Birth/Sex: Treating RN: [birth year] (52 y.o. Roel Cluck Primary Care : [name] Other Clinician: Referring : Treating /Extender: Geralynn Ochs in Treatment: 6 Visit Information History Since Last Visit Added or deleted any medications: No Patient Arrived: Ambulatory Any new allergies or adverse reactions: No Arrival Time: 08:50 Hospitalized since last visit: Yes Accompanied By: self Pain Present Now: No Transfer Assistance: None Patient Identification Verified: Yes Secondary Verification Process Completed: Yes Patient Requires Transmission-Based Precautions: No Patient Has Alerts: Yes Patient Alerts: Patient on Blood Thinner ABI R [place] TBI .61 10/11/22 ABI L [place] TBI .33 10/11/22 Electronic Signature(s) Signed: 04/05/2023 4:05:19 PM By: Midge Aver MSN RN CNS WTA Previous Signature: 03/30/2023 5:22:45 PM Version By: Midge Aver MSN RN CNS WTA Entered By: Midge Aver on 04/05/2023 16:05:18 -------------------------------------------------------------------------------- Clinic Level of Care Assessment Details Patient Name: Date of Service: [name], [name] 03/30/2023 8:30 A M Medical Record Number: [id number] Patient Account Number: [id number] Date of Birth/Sex: Treating RN: Jun 27, 1971 (52 y.o. Roel Cluck Primary Care : [name] Other Clinician: Referring : Treating /Extender: Geralynn Ochs in Treatment: 6 Clinic Level of Care Assessment Items TOOL 1 Quantity Score []  - 0 Use when EandM and Procedure is performed on INITIAL visit ASSESSMENTS - Nursing Assessment / Reassessment []  -  0 General Physical Exam (combine w/ comprehensive assessment (listed just below) when performed on new pt. evals) []  - 0 Comprehensive Assessment (HX, ROS, Risk Assessments, Wounds Hx, etc.) ASSESSMENTS - Wound and Skin Assessment / Reassessment Huseby, Millan A ([id number]) [id number] Page 2 of 11 []  - 0 Dermatologic / Skin Assessment (not related to wound area) ASSESSMENTS - Ostomy and/or Continence Assessment and Care []  - 0 Incontinence Assessment and Management []  - 0 Ostomy Care Assessment and Management (repouching, etc.) PROCESS - Coordination of Care []  - 0 Simple Patient / Family Education for ongoing care []  - 0 Complex (extensive) Patient / Family Education for ongoing care []  - 0 Staff obtains Chiropractor, Records, T Results / Process Orders est []  - 0 Staff telephones HHA, Nursing Homes / Clarify orders / etc []  - 0 Routine Transfer to another Facility (non-emergent condition) []  - 0 Routine Hospital Admission (non-emergent condition) []  - 0 New Admissions / Manufacturing engineer / Ordering NPWT Apligraf, etc. , []  - 0 Emergency Hospital Admission (emergent condition) PROCESS - Special Needs []  - 0 Pediatric / Minor Patient Management []  - 0 Isolation Patient Management []  - 0 Hearing / Language / Visual special needs []  - 0 Assessment of Community assistance (transportation, D/C planning, etc.) []  - 0 Additional assistance / Altered mentation []  - 0 Support Surface(s) Assessment (bed, cushion, seat, etc.) INTERVENTIONS - Miscellaneous []  - 0 External ear exam []  - 0 Patient Transfer (multiple staff / Nurse, adult / Similar devices) []  - 0 Simple Staple / Suture removal (25 or less) []  - 0 Complex Staple / Suture removal (26 or more) []  - 0 Hypo/Hyperglycemic Management (do not check if billed separately) []  - 0 Ankle / Brachial Index (ABI) - do not check if billed separately Has the patient been seen at the hospital within  the last three years: Yes Total Score: 0 Level Of Care: ____ Electronic Signature(s) Signed: 03/30/2023  5:22:45 PM By: Midge Aver MSN RN CNS WTA Entered By: Midge Aver on 03/30/2023 09:22:33 -------------------------------------------------------------------------------- Compression Therapy Details Patient Name: Date of Service: [name], [name] 03/30/2023 8:30 A M Medical Record Number: [id number] Patient Account Number: [id number] Date of Birth/Sex: Treating RN: April 22, 1971 (52 y.o. Roel Cluck Primary Care : [name] Other Clinician: Referring : Treating /Extender: Geralynn Ochs in Treatment: 6 Compression Therapy Performed for Wound Assessment: Wound #21 Left,Dorsal,Anterior Foot KAUAN, KLOOSTERMAN A ([id number]) [id number] Page 3 of 11 Performed By: Clinician Midge Aver, RN Compression Type: Double Layer Post Procedure Diagnosis Same as Pre-procedure Electronic Signature(s) Signed: 03/30/2023 5:22:45 PM By: Midge Aver MSN RN CNS WTA Entered By: Midge Aver on 03/30/2023 09:16:39 -------------------------------------------------------------------------------- Compression Therapy Details Patient Name: Date of Service: [name], [name] 03/30/2023 8:30 A M Medical Record Number: [id number] Patient Account Number: [id number] Date of Birth/Sex: Treating RN: 11/18/1970 (52 y.o. Roel Cluck Primary Care : [name] Other Clinician: Referring : Treating /Extender: Geralynn Ochs in Treatment: 6 Compression Therapy Performed for Wound Assessment: Wound #22 Left Lower Leg Performed By: Clinician Midge Aver, RN Compression Type: Double Layer Post Procedure Diagnosis Same as Pre-procedure Electronic Signature(s) Signed: 03/30/2023 5:22:45 PM By: Midge Aver MSN RN CNS WTA Entered By: Midge Aver on 03/30/2023  09:16:39 -------------------------------------------------------------------------------- Encounter Discharge Information Details Patient Name: Date of Service: [name], [name] 03/30/2023 8:30 A M Medical Record Number: [id number] Patient Account Number: [id number] Date of Birth/Sex: Treating RN: [birth year] (52 y.o. Roel Cluck Primary Care : [name] Other Clinician: Referring : Treating /Extender: Geralynn Ochs in Treatment: 6 Encounter Discharge Information Items Post Procedure Vitals Discharge Condition: Stable Temperature (F): 98.2 Ambulatory Status: Ambulatory Pulse (bpm): 51 Discharge Destination: Home Respiratory Rate (breaths/min): 18 Transportation: Private Auto Blood Pressure (mmHg): 172/80 Accompanied By: self Schedule Follow-up Appointment: OSHEN, WLODARCZYK A ([id number]) [id number] Page 4 of 11 Clinical Summary of Care: Electronic Signature(s) Signed: 03/30/2023 5:22:45 PM By: Midge Aver MSN RN CNS WTA Entered By: Midge Aver on 03/30/2023 09:24:21 -------------------------------------------------------------------------------- Lower Extremity Assessment Details Patient Name: Date of Service: [name], [name] 03/30/2023 8:30 A M Medical Record Number: [id number] Patient Account Number: [id number] Date of Birth/Sex: Treating RN: 1970-10-01 (52 y.o. Roel Cluck Primary Care : [name] Other Clinician: Referring : Treating /Extender: Geralynn Ochs in Treatment: 6 Edema Assessment Assessed: Kyra Searles: Yes] Franne Forts: Yes] [Left: Edema] [Right: :] Calf Left: Right: Point of Measurement: 40 cm From Medial Instep 52.5 cm 48 cm Ankle Left: Right: Point of Measurement: 10 cm From Medial Instep 44 cm 34 cm Knee To Floor Left: Right: From Medial Instep 52 cm 52 cm Vascular Assessment Pulses: Dorsalis Pedis Palpable:  [Left:Yes] [Right:Yes] Extremity colors, hair growth, and conditions: Extremity Color: [Left:Pale] [Right:Normal] Hair Growth on Extremity: [Left:No] [Right:No] Temperature of Extremity: [Left:Warm] [Right:Warm] Capillary Refill: [Left:< 3 seconds] [Right:< 3 seconds] Dependent Rubor: [Left:No] [Right:No] Blanched when Elevated: [Left:No No] [Right:No No] Toe Nail Assessment Left: Right: Thick: Yes Yes Discolored: Yes Yes Deformed: Yes Yes Improper Length and Hygiene: Yes Yes Electronic Signature(s) Signed: 04/05/2023 4:50:13 PM By: Midge Aver MSN RN CNS WTA Previous Signature: 03/30/2023 5:22:45 PM Version By: Midge Aver MSN RN CNS WTA Entered By: Midge Aver on 04/05/2023 16:50:13 Yerkes, Kee A ([id number]) [id number] Page 5 of 11 -------------------------------------------------------------------------------- Multi Wound Chart Details Patient Name: Date of Service: [name], CO SLO  W A. 03/30/2023 8:30 A M Medical Record Number: [id number] Patient Account Number: [id number] Date of Birth/Sex: Treating RN: 28-Dec-1970 (52 y.o. Roel Cluck Primary Care : [name] Other Clinician: Referring : Treating /Extender: Geralynn Ochs in Treatment: 6 Vital Signs Height(in): 76 Pulse(bpm): 51 Weight(lbs): 400 Blood Pressure(mmHg): 172/80 Body Mass Index(BMI): 48.7 Temperature(F): 98.2 Respiratory Rate(breaths/min): 18 [21:Photos: No Photos] [N/A:N/A] Left, Dorsal, Anterior Foot Left Lower Leg N/A Wound Location: Gradually Appeared Gradually Appeared N/A Wounding Event: Diabetic Wound/Ulcer of the Lower Diabetic Wound/Ulcer of the Lower N/A Primary Etiology: Extremity Extremity Hypertension, Type II Diabetes, Hypertension, Type II Diabetes, N/A Comorbid History: Neuropathy Neuropathy 12/29/2022 08/30/2022 N/A Date Acquired: 6 6 N/A Weeks of Treatment: Healed - Epithelialized Open N/A Wound  Status: No No N/A Wound Recurrence: 0x0x0 7x5x0.1 N/A Measurements L x W x D (cm) 0 27.489 N/A A (cm) : rea 0 2.749 N/A Volume (cm) : 100.00% 69.70% N/A % Reduction in A rea: 100.00% 94.90% N/A % Reduction in Volume: Grade 1 Grade 2 N/A Classification: Medium Medium N/A Exudate A mount: Serosanguineous Serosanguineous N/A Exudate Type: red, brown red, brown N/A Exudate Color: Large (67-100%) Large (67-100%) N/A Granulation A mount: Red Red N/A Granulation Quality: Small (1-33%) None Present (0%) N/A Necrotic A mount: Fat Layer (Subcutaneous Tissue): Yes Fat Layer (Subcutaneous Tissue): Yes N/A Exposed Structures: Fascia: No Fascia: No Tendon: No Tendon: No Muscle: No Muscle: No Joint: No Joint: No Bone: No Bone: No None None N/A Epithelialization: N/A Debridement - Excisional N/A Debridement: Pre-procedure Verification/Time Out N/A 09:15 N/A Taken: N/A Lidocaine 4% Topical Solution N/A Pain Control: N/A Subcutaneous, Slough N/A Tissue Debrided: N/A Skin/Subcutaneous Tissue N/A Level: N/A 27.48 N/A Debridement A (sq cm): rea N/A Curette N/A Instrument: N/A Minimum N/A Bleeding: N/A Pressure N/A Hemostasis A chievedDONTRAVIOUS, CAMILLE A ([id number]) [id number] Page 6 of 11 N/A 0 N/A Procedural Pain: N/A 0 N/A Post Procedural Pain: N/A Procedure was tolerated well N/A Debridement Treatment Response: N/A 7x5x0.1 N/A Post Debridement Measurements L x W x D (cm) N/A 2.749 N/A Post Debridement Volume: (cm) Compression Therapy Compression Therapy N/A Procedures Performed: Debridement Treatment Notes Electronic Signature(s) Signed: 03/30/2023 5:22:45 PM By: Midge Aver MSN RN CNS WTA Entered By: Midge Aver on 03/30/2023 09:22:09 -------------------------------------------------------------------------------- Multi-Disciplinary Care Plan Details Patient Name: Date of Service: [name], [name] 03/30/2023 8:30 A M Medical  Record Number: [id number] Patient Account Number: [id number] Date of Birth/Sex: Treating RN: [birth year] (52 y.o. Roel Cluck Primary Care : [name] Other Clinician: Referring : Treating /Extender: Geralynn Ochs in Treatment: 6 Active Inactive Wound/Skin Impairment Nursing Diagnoses: Knowledge deficit related to ulceration/compromised skin integrity Goals: Patient/caregiver will verbalize understanding of skin care regimen Date Initiated: 03/09/2023 Target Resolution Date: 04/09/2023 Goal Status: Active Ulcer/skin breakdown will have a volume reduction of 30% by week 4 Date Initiated: 03/09/2023 Target Resolution Date: 04/09/2023 Goal Status: Active Ulcer/skin breakdown will have a volume reduction of 50% by week 8 Date Initiated: 03/09/2023 Target Resolution Date: 05/10/2023 Goal Status: Active Ulcer/skin breakdown will have a volume reduction of 80% by week 12 Date Initiated: 03/09/2023 Target Resolution Date: 06/09/2023 Goal Status: Active Ulcer/skin breakdown will heal within 14 weeks Date Initiated: 03/09/2023 Target Resolution Date: 07/10/2023 Goal Status: Active Interventions: Assess patient/caregiver ability to obtain necessary supplies Assess patient/caregiver ability to perform ulcer/skin care regimen upon admission and as needed Assess ulceration(s) every visit Notes: Electronic Signature(s) Signed: 03/30/2023 5:22:45 PM By: Midge Aver MSN  RN CNS WTA Entered By: Midge Aver on 03/30/2023 09:22:58 Egolf, Doree Fudge ([id number]) [id number] Page 7 of 11 -------------------------------------------------------------------------------- Pain Assessment Details Patient Name: Date of Service: [name], [name] 03/30/2023 8:30 A M Medical Record Number: [id number] Patient Account Number: [id number] Date of Birth/Sex: Treating RN: 11/04/1970 (52 y.o. Roel Cluck Primary Care :  [name] Other Clinician: Referring : Treating /Extender: Geralynn Ochs in Treatment: 6 Active Problems Location of Pain Severity and Description of Pain Patient Has Paino No Site Locations Pain Management and Medication Current Pain Management: Electronic Signature(s) Signed: 03/30/2023 5:22:45 PM By: Midge Aver MSN RN CNS WTA Entered By: Midge Aver on 03/30/2023 08:53:48 -------------------------------------------------------------------------------- Patient/Caregiver Education Details Patient Name: Date of Service: [name], [name] 7/31/2024andnbsp8:30 A M Medical Record Number: [id number] Patient Account Number: [id number] Date of Birth/Gender: Treating RN: [birth year] (52 y.o. Roel Cluck Primary Care Physician: [name] Other Clinician: Referring Physician: Treating Physician/Extender: Geralynn Ochs in Treatment: 6 Orzechowski, [place] A ([id number]) [id number] Page 8 of 11 Education Assessment Education Provided To: Patient Education Topics Provided Wound Debridement: Handouts: Wound Debridement Methods: Explain/Verbal Responses: State content correctly Wound/Skin Impairment: Handouts: Caring for Your Ulcer Methods: Explain/Verbal Responses: State content correctly Electronic Signature(s) Signed: 03/30/2023 5:22:45 PM By: Midge Aver MSN RN CNS WTA Entered By: Midge Aver on 03/30/2023 09:23:15 -------------------------------------------------------------------------------- Wound Assessment Details Patient Name: Date of Service: [name], [name] 03/30/2023 8:30 A M Medical Record Number: [id number] Patient Account Number: [id number] Date of Birth/Sex: Treating RN: August 14, 1971 (52 y.o. Roel Cluck Primary Care : [name] Other Clinician: Referring : Treating /Extender: Geralynn Ochs in  Treatment: 6 Wound Status Wound Number: 21 Primary Etiology: Diabetic Wound/Ulcer of the Lower Extremity Wound Location: Left, Dorsal, Anterior Foot Wound Status: Healed - Epithelialized Wounding Event: Gradually Appeared Comorbid History: Hypertension, Type II Diabetes, Neuropathy Date Acquired: 12/29/2022 Weeks Of Treatment: 6 Clustered Wound: No Wound Measurements Length: (cm) Width: (cm) Depth: (cm) Area: (cm) Volume: (cm) 0 % Reduction in Area: 100% 0 % Reduction in Volume: 100% 0 Epithelialization: None 0 0 Wound Description Classification: Grade 1 Exudate Amount: Medium Exudate Type: Serosanguineous Exudate Color: red, brown Foul Odor After Cleansing: No Slough/Fibrino Yes Wound Bed Granulation Amount: Large (67-100%) Exposed Structure Granulation Quality: Red Fascia Exposed: No Necrotic Amount: Small (1-33%) Fat Layer (Subcutaneous Tissue) Exposed: Yes Tendon Exposed: No Muscle Exposed: No Mitzel, Keyden A ([id number]) [id number] Page 9 of 11 Joint Exposed: No Bone Exposed: No Treatment Notes Wound #21 (Foot) Wound Laterality: Dorsal, Left, Anterior Cleanser Peri-Wound Care Topical Primary Dressing Secondary Dressing Secured With Compression Wrap Compression Stockings Add-Ons Electronic Signature(s) Signed: 03/30/2023 5:22:45 PM By: Midge Aver MSN RN CNS WTA Entered By: Midge Aver on 03/30/2023 09:21:40 -------------------------------------------------------------------------------- Wound Assessment Details Patient Name: Date of Service: [name], [name] 03/30/2023 8:30 A M Medical Record Number: [id number] Patient Account Number: [id number] Date of Birth/Sex: Treating RN: 09/26/1970 (52 y.o. Roel Cluck Primary Care : [name] Other Clinician: Referring : Treating /Extender: Geralynn Ochs in Treatment: 6 Wound Status Wound Number: 22 Primary Etiology:  Diabetic Wound/Ulcer of the Lower Extremity Wound Location: Left Lower Leg Wound Status: Open Wounding Event: Gradually Appeared Comorbid History: Hypertension, Type II Diabetes, Neuropathy Date Acquired: 08/30/2022 Weeks Of Treatment: 6 Clustered Wound: No Photos Wound Measurements Length: (cm) 7 Width: (cm) 5 Karen, Savvas A ([id number]) Depth: (cm) 0.1 Area: (cm)  27. Volume: (cm) 2.7 % Reduction in Area: 69.7% % Reduction in Volume: 94.9% [id number] Page 10 of 11 Epithelialization: None 489 49 Wound Description Classification: Grade 2 Exudate Amount: Medium Exudate Type: Serosanguineous Exudate Color: red, brown Foul Odor After Cleansing: No Slough/Fibrino No Wound Bed Granulation Amount: Large (67-100%) Exposed Structure Granulation Quality: Red Fascia Exposed: No Necrotic Amount: None Present (0%) Fat Layer (Subcutaneous Tissue) Exposed: Yes Tendon Exposed: No Muscle Exposed: No Joint Exposed: No Bone Exposed: No Treatment Notes Wound #22 (Lower Leg) Wound Laterality: Left Cleanser Soap and Water Discharge Instruction: Gently cleanse wound with antibacterial soap, rinse and pat dry prior to dressing wounds Wound Cleanser Discharge Instruction: Wash your hands with soap and water. Remove old dressing, discard into plastic bag and place into trash. Cleanse the wound with Wound Cleanser prior to applying a clean dressing using gauze sponges, not tissues or cotton balls. Do not scrub or use excessive force. Pat dry using gauze sponges, not tissue or cotton balls. Peri-Wound Care Topical Primary Dressing Secondary Dressing Kerlix 4.5 x 4.1 (in/yd) Discharge Instruction: from toes to knee Zetuvit Plus 4x4 (in/in) Secured With Compression Wrap coban Discharge Instruction: from toes to knee Compression Stockings Add-Ons Electronic Signature(s) Signed: 03/30/2023 5:22:45 PM By: Midge Aver MSN RN CNS WTA Entered By: Midge Aver on  03/30/2023 09:04:04 -------------------------------------------------------------------------------- Vitals Details Patient Name: Date of Service: [name], [name] 03/30/2023 8:30 A M Medical Record Number: [id number] Patient Account Number: [id number] Date of Birth/Sex: Treating RN: 08-Oct-1970 (52 y.o. Roel Cluck Primary Care : [name] Other Clinician: ZEPHANIAH, LUBRANO ([id number]) [id number] Page 11 of 11 Referring : Treating /Extender: Geralynn Ochs in Treatment: 6 Vital Signs Time Taken: 08:51 Temperature (F): 98.2 Height (in): 76 Pulse (bpm): 51 Weight (lbs): 400 Respiratory Rate (breaths/min): 18 Body Mass Index (BMI): 48.7 Blood Pressure (mmHg): 172/80 Reference Range: 80 - 120 mg / dl Electronic Signature(s) Signed: 03/30/2023 5:22:45 PM By: Midge Aver MSN RN CNS WTA Entered By: Midge Aver on 03/30/2023 08:53:43

## 2023-04-02 DIAGNOSIS — N2581 Secondary hyperparathyroidism of renal origin: Secondary | ICD-10-CM | POA: Diagnosis not present

## 2023-04-02 DIAGNOSIS — L03116 Cellulitis of left lower limb: Secondary | ICD-10-CM | POA: Diagnosis not present

## 2023-04-02 DIAGNOSIS — L03115 Cellulitis of right lower limb: Secondary | ICD-10-CM | POA: Diagnosis not present

## 2023-04-02 DIAGNOSIS — N186 End stage renal disease: Secondary | ICD-10-CM | POA: Diagnosis not present

## 2023-04-02 DIAGNOSIS — Z992 Dependence on renal dialysis: Secondary | ICD-10-CM | POA: Diagnosis not present

## 2023-04-02 DIAGNOSIS — L97929 Non-pressure chronic ulcer of unspecified part of left lower leg with unspecified severity: Secondary | ICD-10-CM | POA: Diagnosis not present

## 2023-04-02 DIAGNOSIS — R7881 Bacteremia: Secondary | ICD-10-CM | POA: Diagnosis not present

## 2023-04-04 DIAGNOSIS — Z09 Encounter for follow-up examination after completed treatment for conditions other than malignant neoplasm: Secondary | ICD-10-CM | POA: Diagnosis not present

## 2023-04-04 DIAGNOSIS — Z8619 Personal history of other infectious and parasitic diseases: Secondary | ICD-10-CM | POA: Diagnosis not present

## 2023-04-04 DIAGNOSIS — Z992 Dependence on renal dialysis: Secondary | ICD-10-CM | POA: Diagnosis not present

## 2023-04-05 DIAGNOSIS — R7881 Bacteremia: Secondary | ICD-10-CM | POA: Diagnosis not present

## 2023-04-05 DIAGNOSIS — L03115 Cellulitis of right lower limb: Secondary | ICD-10-CM | POA: Diagnosis not present

## 2023-04-05 DIAGNOSIS — I120 Hypertensive chronic kidney disease with stage 5 chronic kidney disease or end stage renal disease: Secondary | ICD-10-CM | POA: Diagnosis not present

## 2023-04-05 DIAGNOSIS — I872 Venous insufficiency (chronic) (peripheral): Secondary | ICD-10-CM | POA: Diagnosis not present

## 2023-04-05 DIAGNOSIS — L03116 Cellulitis of left lower limb: Secondary | ICD-10-CM | POA: Diagnosis not present

## 2023-04-05 DIAGNOSIS — J449 Chronic obstructive pulmonary disease, unspecified: Secondary | ICD-10-CM | POA: Diagnosis not present

## 2023-04-05 DIAGNOSIS — E1051 Type 1 diabetes mellitus with diabetic peripheral angiopathy without gangrene: Secondary | ICD-10-CM | POA: Diagnosis not present

## 2023-04-05 DIAGNOSIS — L97929 Non-pressure chronic ulcer of unspecified part of left lower leg with unspecified severity: Secondary | ICD-10-CM | POA: Diagnosis not present

## 2023-04-05 DIAGNOSIS — N186 End stage renal disease: Secondary | ICD-10-CM | POA: Diagnosis not present

## 2023-04-05 DIAGNOSIS — B954 Other streptococcus as the cause of diseases classified elsewhere: Secondary | ICD-10-CM | POA: Diagnosis not present

## 2023-04-05 DIAGNOSIS — Z992 Dependence on renal dialysis: Secondary | ICD-10-CM | POA: Diagnosis not present

## 2023-04-05 DIAGNOSIS — E1022 Type 1 diabetes mellitus with diabetic chronic kidney disease: Secondary | ICD-10-CM | POA: Diagnosis not present

## 2023-04-05 DIAGNOSIS — N2581 Secondary hyperparathyroidism of renal origin: Secondary | ICD-10-CM | POA: Diagnosis not present

## 2023-04-06 ENCOUNTER — Encounter: Payer: Medicare HMO | Attending: Internal Medicine | Admitting: Internal Medicine

## 2023-04-06 DIAGNOSIS — I87312 Chronic venous hypertension (idiopathic) with ulcer of left lower extremity: Secondary | ICD-10-CM | POA: Insufficient documentation

## 2023-04-06 DIAGNOSIS — E11622 Type 2 diabetes mellitus with other skin ulcer: Secondary | ICD-10-CM | POA: Insufficient documentation

## 2023-04-06 DIAGNOSIS — E11621 Type 2 diabetes mellitus with foot ulcer: Secondary | ICD-10-CM | POA: Diagnosis not present

## 2023-04-06 DIAGNOSIS — L97822 Non-pressure chronic ulcer of other part of left lower leg with fat layer exposed: Secondary | ICD-10-CM | POA: Diagnosis not present

## 2023-04-06 DIAGNOSIS — L97522 Non-pressure chronic ulcer of other part of left foot with fat layer exposed: Secondary | ICD-10-CM | POA: Insufficient documentation

## 2023-04-06 DIAGNOSIS — I890 Lymphedema, not elsewhere classified: Secondary | ICD-10-CM | POA: Insufficient documentation

## 2023-04-06 DIAGNOSIS — Z8619 Personal history of other infectious and parasitic diseases: Secondary | ICD-10-CM | POA: Diagnosis not present

## 2023-04-06 DIAGNOSIS — Z79899 Other long term (current) drug therapy: Secondary | ICD-10-CM | POA: Diagnosis not present

## 2023-04-06 DIAGNOSIS — I87321 Chronic venous hypertension (idiopathic) with inflammation of right lower extremity: Secondary | ICD-10-CM | POA: Diagnosis not present

## 2023-04-07 DIAGNOSIS — R7881 Bacteremia: Secondary | ICD-10-CM | POA: Diagnosis not present

## 2023-04-07 DIAGNOSIS — L03115 Cellulitis of right lower limb: Secondary | ICD-10-CM | POA: Diagnosis not present

## 2023-04-07 DIAGNOSIS — Z992 Dependence on renal dialysis: Secondary | ICD-10-CM | POA: Diagnosis not present

## 2023-04-07 DIAGNOSIS — L97929 Non-pressure chronic ulcer of unspecified part of left lower leg with unspecified severity: Secondary | ICD-10-CM | POA: Diagnosis not present

## 2023-04-07 DIAGNOSIS — N2581 Secondary hyperparathyroidism of renal origin: Secondary | ICD-10-CM | POA: Diagnosis not present

## 2023-04-07 DIAGNOSIS — N186 End stage renal disease: Secondary | ICD-10-CM | POA: Diagnosis not present

## 2023-04-07 DIAGNOSIS — L03116 Cellulitis of left lower limb: Secondary | ICD-10-CM | POA: Diagnosis not present

## 2023-04-08 DIAGNOSIS — I120 Hypertensive chronic kidney disease with stage 5 chronic kidney disease or end stage renal disease: Secondary | ICD-10-CM | POA: Diagnosis not present

## 2023-04-08 DIAGNOSIS — J449 Chronic obstructive pulmonary disease, unspecified: Secondary | ICD-10-CM | POA: Diagnosis not present

## 2023-04-08 DIAGNOSIS — N186 End stage renal disease: Secondary | ICD-10-CM | POA: Diagnosis not present

## 2023-04-08 DIAGNOSIS — L97929 Non-pressure chronic ulcer of unspecified part of left lower leg with unspecified severity: Secondary | ICD-10-CM | POA: Diagnosis not present

## 2023-04-08 DIAGNOSIS — I872 Venous insufficiency (chronic) (peripheral): Secondary | ICD-10-CM | POA: Diagnosis not present

## 2023-04-08 DIAGNOSIS — R7881 Bacteremia: Secondary | ICD-10-CM | POA: Diagnosis not present

## 2023-04-08 DIAGNOSIS — E1051 Type 1 diabetes mellitus with diabetic peripheral angiopathy without gangrene: Secondary | ICD-10-CM | POA: Diagnosis not present

## 2023-04-08 DIAGNOSIS — B954 Other streptococcus as the cause of diseases classified elsewhere: Secondary | ICD-10-CM | POA: Diagnosis not present

## 2023-04-08 DIAGNOSIS — E1022 Type 1 diabetes mellitus with diabetic chronic kidney disease: Secondary | ICD-10-CM | POA: Diagnosis not present

## 2023-04-08 NOTE — Progress Notes (Signed)
Already schedule with RN for f/u

## 2023-04-09 DIAGNOSIS — L03116 Cellulitis of left lower limb: Secondary | ICD-10-CM | POA: Diagnosis not present

## 2023-04-09 DIAGNOSIS — L97929 Non-pressure chronic ulcer of unspecified part of left lower leg with unspecified severity: Secondary | ICD-10-CM | POA: Diagnosis not present

## 2023-04-09 DIAGNOSIS — N2581 Secondary hyperparathyroidism of renal origin: Secondary | ICD-10-CM | POA: Diagnosis not present

## 2023-04-09 DIAGNOSIS — L03115 Cellulitis of right lower limb: Secondary | ICD-10-CM | POA: Diagnosis not present

## 2023-04-09 DIAGNOSIS — N186 End stage renal disease: Secondary | ICD-10-CM | POA: Diagnosis not present

## 2023-04-09 DIAGNOSIS — Z992 Dependence on renal dialysis: Secondary | ICD-10-CM | POA: Diagnosis not present

## 2023-04-09 DIAGNOSIS — R7881 Bacteremia: Secondary | ICD-10-CM | POA: Diagnosis not present

## 2023-04-09 NOTE — Progress Notes (Signed)
MRN : [id number]  [name] is a 52 y.o. (September 05, 1970) male who presents with chief complaint of legs hurt and swell.  History of Present Illness:   Patient returns to the office for reevaluation of his venous ulcer.  At this point he says that is doing very very well it has gotten tremendously small the ulcer on top of his foot is essentially healed.  Denies any fever or chills.  Overall he is very pleased.  The patient is also seen for followup of their dialysis access.   The patient reports the function of the access has been stable. Patient denies difficulty with cannulation. The patient denies increased bleeding time after removing the needles. The patient denies hand pain or other symptoms consistent with steal phenomena.  No significant arm swelling.  The patient denies any complaints from the dialysis center or their nephrologist.  The patient denies redness or swelling at the access site. The patient denies fever or chills at home or while on dialysis.  No recent shortening of the patient's walking distance or new symptoms consistent with claudication.  No history of rest pain symptoms. No new ulcers or wounds of the lower extremities have occurred.  The patient denies amaurosis fugax or recent TIA symptoms. There are no recent neurological changes noted. There is no history of DVT, PE or superficial thrombophlebitis. No recent episodes of angina or shortness of breath documented.    No outpatient medications have been marked as taking for the 04/11/23 encounter (Appointment) with Gilda Crease, Latina Craver, MD.    Past Medical History:  Diagnosis Date   Anemia    Chronic kidney disease    14% FUNCTION   Diabetes mellitus without complication (HCC)    Diabetic retinopathy (HCC)    Dyspnea    DOE   Dysrhythmia    End stage renal disease (HCC)    History of orthopnea    error wrong patietn   Hypercholesteremia    error   Hyperkalemia    error wrong patient    Hypertension    Long-term insulin use (HCC)    Lymphedema of leg    Metabolic encephalopathy    error   Microalbuminuria    MSSA (methicillin susceptible Staphylococcus aureus)    Obesity    PVD (peripheral vascular disease) (HCC)    Respiratory failure (HCC)    Error   Sepsis (HCC)    error   Sleep apnea    no CPAP   UTI (urinary tract infection)    error   Venous ulcer (HCC)    Vitamin D deficiency     Past Surgical History:  Procedure Laterality Date   A/V FISTULAGRAM Right 10/24/2018   Procedure: A/V FISTULAGRAM;  Surgeon: Renford Dills, MD;  Location: [facility];  Service: Cardiovascular;  Laterality: Right;   A/V FISTULAGRAM Right 11/24/2018   Procedure: A/V FISTULAGRAM;  Surgeon: Renford Dills, MD;  Location: [facility];  Service: Cardiovascular;  Laterality: Right;   A/V FISTULAGRAM Left 07/31/2019   Procedure: A/V FISTULAGRAM;  Surgeon: Renford Dills, MD;  Location: [facility];  Service: Cardiovascular;  Laterality: Left;   A/V FISTULAGRAM Left 12/11/2019   Procedure: A/V FISTULAGRAM;  Surgeon: Renford Dills, MD;  Location: [facility];  Service: Cardiovascular;  Laterality: Left;   A/V SHUNT INTERVENTION Right 02/21/2019   Procedure: A/V SHUNT INTERVENTION;  Surgeon: Renford Dills, MD;  Location: [facility]  INVASIVE CV LAB;  Service: Cardiovascular;  Laterality: Right;   APPLICATION OF WOUND VAC Left 10/03/2017   Procedure: APPLICATION OF WOUND VAC;  Surgeon: Annice Needy, MD;  Location: [facility];  Service: General;  Laterality: Left;   APPLICATION OF WOUND VAC Left 10/11/2017   Procedure: APPLICATION OF WOUND VAC;  Surgeon: Renford Dills, MD;  Location: [facility];  Service: Vascular;  Laterality: Left;   APPLICATION OF WOUND VAC Left 10/14/2017   Procedure: WOUND VAC CHANGE;  Surgeon: Renford Dills, MD;  Location: [facility];  Service: Vascular;  Laterality: Left;  left lower leg   APPLICATION OF WOUND VAC Left  10/18/2017   Procedure: WOUND VAC CHANGE;  Surgeon: Renford Dills, MD;  Location: [facility];  Service: Vascular;  Laterality: Left;   APPLICATION OF WOUND VAC Left 10/07/2017   Procedure: APPLICATION OF WOUND VAC;  Surgeon: Renford Dills, MD;  Location: [facility];  Service: Vascular;  Laterality: Left;   APPLICATION OF WOUND VAC Left 11/17/2022   Procedure: Wound vac exchange;  Surgeon: Renford Dills, MD;  Location: [facility];  Service: Vascular;  Laterality: Left;   APPLICATION OF WOUND VAC Left 11/10/2022   Procedure: APPLICATION OF WOUND VAC;  Surgeon: Renford Dills, MD;  Location: [facility];  Service: Vascular;  Laterality: Left;   APPLICATION OF WOUND VAC Left 11/24/2022   Procedure: WOUND VAC REMOVAL;  Surgeon: Renford Dills, MD;  Location: [facility];  Service: Vascular;  Laterality: Left;   APPLICATION OF WOUND VAC Left 11/12/2022   Procedure: APPLICATION OF WOUND VAC;  Surgeon: Renford Dills, MD;  Location: [facility];  Service: Vascular;  Laterality: Left;   APPLICATION OF WOUND VAC Left 01/06/2023   Procedure: APPLICATION OF WOUND VAC;  Surgeon: Renford Dills, MD;  Location: [facility];  Service: Vascular;  Laterality: Left;   AV FISTULA INSERTION W/ RF MAGNETIC GUIDANCE Right 07/19/2018   Procedure: AV FISTULA INSERTION W/RF MAGNETIC GUIDANCE;  Surgeon: Renford Dills, MD;  Location: [facility];  Service: Cardiovascular;  Laterality: Right;   AV FISTULA PLACEMENT Left 05/11/2019   Procedure: ARTERIOVENOUS (AV) FISTULA CREATION (RADIOCEPHALIC );  Surgeon: Renford Dills, MD;  Location: [facility];  Service: Vascular;  Laterality: Left;   AV FISTULA PLACEMENT Left 02/20/2020   Procedure: INSERTION OF ARTERIOVENOUS (AV) GORE-TEX GRAFT LEFT ARM ( FOREARM LOOP );  Surgeon: Renford Dills, MD;  Location: [facility];  Service: Vascular;  Laterality: Left;   CATARACT EXTRACTION W/PHACO Left 11/01/2018   Procedure: CATARACT EXTRACTION PHACO AND INTRAOCULAR LENS  PLACEMENT (IOC)-LEFT, DIABETIC-INSULIN DEPENDENT;  Surgeon: Nevada Crane, MD;  Location: [facility];  Service: Ophthalmology;  Laterality: Left;  [place] 00:41.8 CDE 4.61 Fluid Pack Lot # [id number]   CATARACT EXTRACTION W/PHACO Right 04/27/2019   Procedure: CATARACT EXTRACTION PHACO AND INTRAOCULAR LENS PLACEMENT (IOC);  Surgeon: Nevada Crane, MD;  Location: [facility];  Service: Ophthalmology;  Laterality: Right;  [place] 00:34 CDE 1.97 FLUID PACK LOT # [id number]    COLONOSCOPY WITH PROPOFOL N/A 05/12/2020   Procedure: COLONOSCOPY WITH PROPOFOL;  Surgeon: Tol[place], Boykin Nearing, MD;  Location: [facility];  Service: Gastroenterology;  Laterality: N/A;   DIALYSIS/PERMA CATHETER INSERTION N/A 02/23/2019   Procedure: DIALYSIS/PERMA CATHETER INSERTION;  Surgeon: Annice Needy, MD;  Location: [facility];  Service: Cardiovascular;  Laterality: N/A;   DIALYSIS/PERMA CATHETER REMOVAL N/A 04/03/2020   Procedure: DIALYSIS/PERMA CATHETER REMOVAL;  Surgeon: Renford Dills, MD;  Location: [facility];  Service: Cardiovascular;  Laterality: N/A;   DIALYSIS/PERMA CATHETER REMOVAL N/A 02/03/2022   Procedure: DIALYSIS/PERMA CATHETER REMOVAL;  Surgeon: Renford Dills, MD;  Location: [facility];  Service: Cardiovascular;  Laterality: N/A;   I & D EXTREMITY Left 10/11/2017   Procedure: IRRIGATION AND DEBRIDEMENT EXTREMITY;  Surgeon: Renford Dills, MD;  Location: [facility];  Service: Vascular;  Laterality: Left;   INCISION AND DRAINAGE ABSCESS Right 10/07/2017   Procedure: INCISION AND DRAINAGE ABSCESS;  Surgeon: Renford Dills, MD;  Location: [facility];  Service: Vascular;  Laterality: Right;   INCISION AND DRAINAGE OF WOUND Left 11/17/2022   Procedure: DEBRIDEMENT WOUND;  Surgeon: Renford Dills, MD;  Location: [facility];  Service: Vascular;  Laterality: Left;   INCISION AND DRAINAGE OF WOUND Left 11/24/2022   Procedure: DEBRIDEMENT WOUND LEFT LOWER EXTREMITY;  Surgeon: Renford Dills, MD;  Location: [facility];  Service: Vascular;  Laterality: Left;   IRRIGATION AND DEBRIDEMENT ABSCESS Left 10/03/2017   Procedure: IRRIGATION AND DEBRIDEMENT ABSCESS with debridement of skin, soft tissue, muscle 50sq cm;  Surgeon: Annice Needy, MD;  Location: [facility];  Service: General;  Laterality: Left;   LOWER EXTREMITY ANGIOGRAPHY Left 10/15/2022   Procedure: Lower Extremity Angiography;  Surgeon: Renford Dills, MD;  Location: [facility];  Service: Cardiovascular;  Laterality: Left;   TEE WITHOUT CARDIOVERSION N/A 03/29/2023   Procedure: TRANSESOPHAGEAL ECHOCARDIOGRAM;  Surgeon: Antonieta Iba, MD;  Location: [facility];  Service: Cardiovascular;  Laterality: N/A;   TEMPORARY DIALYSIS CATHETER  02/20/2019   Procedure: TEMPORARY DIALYSIS CATHETER;  Surgeon: Renford Dills, MD;  Location: [facility];  Service: Cardiovascular;;   UPPER EXTREMITY ANGIOGRAPHY Left 09/18/2019   Procedure: UPPER EXTREMITY ANGIOGRAPHY;  Surgeon: Renford Dills, MD;  Location: [facility];  Service: Cardiovascular;  Laterality: Left;   WOUND DEBRIDEMENT Left 11/10/2022   Procedure: DEBRIDEMENT WOUND;  Surgeon: Renford Dills, MD;  Location: [facility];  Service: Vascular;  Laterality: Left;   WOUND DEBRIDEMENT Left 11/12/2022   Procedure: DEBRIDEMENT WOUND;  Surgeon: Renford Dills, MD;  Location: [facility];  Service: Vascular;  Laterality: Left;   WOUND DEBRIDEMENT Left 01/06/2023   Procedure: DEBRIDEMENT WOUND;  Surgeon: Renford Dills, MD;  Location: [facility];  Service: Vascular;  Laterality: Left;    Social History Social History   Tobacco Use   Smoking status: Former    Types: Cigars    Passive exposure: Past   Smokeless tobacco: Former    Quit date: 09/30/2017   Tobacco comments:    occasional  Vaping Use   Vaping status: Never Used  Substance Use Topics   Alcohol use: Yes    Alcohol/week: 1.0 standard drink of alcohol    Types: 1 Cans of beer per  week    Comment: beer   Drug use: Not Currently    Family History Family History  Problem Relation Age of Onset   Diabetes Father    Kidney disease Father    Diabetes Daughter     Allergies  Allergen Reactions   Lisinopril Swelling    Lips mouth    Furosemide Cough    Light headed, blurry vision, weakness.   Latex Itching   Adhesive [Tape] Itching and Rash     REVIEW OF SYSTEMS (Negative unless checked)  Constitutional: [] Weight loss  [] Fever  [] Chills Cardiac: [] Chest pain   [] Chest pressure   [] Palpitations   []   Shortness of breath when laying flat   [] Shortness of breath with exertion. Vascular:  [] Pain in legs with walking   [x] Pain in legs at rest  [] History of DVT   [] Phlebitis   [x] Swelling in legs   [] Varicose veins   [] Non-healing ulcers Pulmonary:   [] Uses home oxygen   [] Productive cough   [] Hemoptysis   [] Wheeze  [] COPD   [] Asthma Neurologic:  [] Dizziness   [] Seizures   [] History of stroke   [] History of TIA  [] Aphasia   [] Vissual changes   [] Weakness or numbness in arm   [] Weakness or numbness in leg Musculoskeletal:   [] Joint swelling   [] Joint pain   [] Low back pain Hematologic:  [] Easy bruising  [] Easy bleeding   [] Hypercoagulable state   [] Anemic Gastrointestinal:  [] Diarrhea   [] Vomiting  [] Gastroesophageal reflux/heartburn   [] Difficulty swallowing. Genitourinary:  [] Chronic kidney disease   [] Difficult urination  [] Frequent urination   [] Blood in urine Skin:  [] Rashes   [] Ulcers  Psychological:  [] History of anxiety   []  History of major depression.  Physical Examination  There were no vitals filed for this visit. There is no height or weight on file to calculate BMI. Gen: WD/WN, NAD Head: [place]/AT, No temporalis wasting.  Ear/Nose/Throat: Hearing grossly intact, nares w/o erythema or drainage, pinna without lesions Eyes: PER, EOMI, sclera nonicteric.  Neck: Supple, no gross masses.  No JVD.  Pulmonary:  Good air movement, no audible wheezing, no use of  accessory muscles.  Cardiac: RRR, precordium not hyperdynamic. Vascular: His wound is now about the size of a half dollar wound on the top of the foot is healed. scattered varicosities present bilaterally.  Moderate venous stasis changes to the legs bilaterally.  2+ soft pitting edema. CEAP C4sEpAsPr.  AV access good thrill good bruit Vessel Right Left  Radial Palpable Palpable  Gastrointestinal: soft, non-distended. No guarding/no peritoneal signs.  Musculoskeletal: M/S 5/5 throughout.  No deformity.  Neurologic: CN 2-12 intact. Pain and light touch intact in extremities.  Symmetrical.  Speech is fluent. Motor exam as listed above. Psychiatric: Judgment intact, Mood & affect appropriate for pt's clinical situation. Dermatologic: Venous rashes no ulcers noted.  No changes consistent with cellulitis. Lymph : No lichenification or skin changes of chronic lymphedema.  CBC Lab Results  Component Value Date   WBC 5.7 03/29/2023   HGB 10.9 (L) 03/29/2023   HCT 34.0 (L) 03/29/2023   MCV 93.2 03/29/2023   PLT 151 03/29/2023    BMET    Component Value Date/Time   NA 138 03/29/2023 0620   K 4.1 03/29/2023 0620   CL 99 03/29/2023 0620   CO2 24 03/29/2023 0620   GLUCOSE 124 (H) 03/29/2023 0620   BUN 82 (H) 03/29/2023 0620   CREATININE 13.30 (H) 03/29/2023 0620   CALCIUM 9.5 03/29/2023 0620   GFRNONAA 4 (L) 03/29/2023 0620   GFRAA 9 (L) 02/19/2020 1316   Estimated Creatinine Clearance: 11 mL/min (A) (by C-G formula based on SCr of 13.3 mg/dL (H)).  COAG Lab Results  Component Value Date   INR 1.0 03/24/2023   INR 1.1 11/12/2022   INR 1.0 11/10/2022    Radiology ECHO TEE  Result Date: 03/29/2023    TRANSESOPHOGEAL ECHO REPORT   Patient Name:   SEANPATRICK ZOMBRO Date of Exam: 03/29/2023 Medical Rec #:  [id number]     Height:       76.0 in Accession #:    [id number]    Weight:  378.7 lb Date of Birth:  [birth year]     BSA:          2.906 m Patient Age:    52 years      BP:            161/80 mmHg Patient Gender: M             HR:           61 bpm. Exam Location:  [facility] Procedure: Transesophageal Echo, Cardiac Doppler, Color Doppler and Saline            Contrast Bubble Study Indications:     Bacteremia R78.81  History:         Patient has prior history of Echocardiogram examinations, most                  recent 03/27/2023. Risk Factors:Hypertension and Diabetes. CKD.  Sonographer:     Cristela Blue Referring Phys:  [id number] CHRISTOPHER RONALD BERGE Diagnosing Phys: Julien Nordmann MD PROCEDURE: After discussion of the risks and benefits of a TEE, an informed consent was obtained from the patient. TEE procedure time was 30 minutes. The transesophogeal probe was passed without difficulty through the esophogus of the patient. Imaged were obtained with the patient in a left lateral decubitus position. Local oropharyngeal anesthetic was provided with viscous lidocaine and Cetacaine. Sedation performed by different physician. Image quality was excellent. The patient's vital signs; including heart rate, blood pressure, and oxygen saturation; remained stable throughout the procedure. The patient developed no complications during the procedure.  IMPRESSIONS  1. Left ventricular ejection fraction, by estimation, is 60 to 65%. The left ventricle has normal function. The left ventricle has no regional wall motion abnormalities. There is moderate left ventricular hypertrophy.  2. Right ventricular systolic function is normal. The right ventricular size is normal.  3. No left atrial/left atrial appendage thrombus was detected.  4. The mitral valve is normal in structure. Mild mitral valve regurgitation. No evidence of mitral stenosis.  5. The aortic valve is normal in structure. Aortic valve regurgitation is not visualized. No aortic stenosis is present.  6. The inferior vena cava is normal in size with greater than 50% respiratory variability, suggesting right atrial pressure of 3 mmHg.  7. Agitated saline contrast  bubble study was negative, with no evidence of any interatrial shunt. Conclusion(s)/Recommendation(s): Normal biventricular function without evidence of hemodynamically significant valvular heart disease. FINDINGS  Left Ventricle: Left ventricular ejection fraction, by estimation, is 60 to 65%. The left ventricle has normal function. The left ventricle has no regional wall motion abnormalities. The left ventricular internal cavity size was normal in size. There is  moderate left ventricular hypertrophy. Right Ventricle: The right ventricular size is normal. No increase in right ventricular wall thickness. Right ventricular systolic function is normal. Left Atrium: Left atrial size was normal in size. No left atrial/left atrial appendage thrombus was detected. Right Atrium: Right atrial size was normal in size. Pericardium: There is no evidence of pericardial effusion. Mitral Valve: The mitral valve is normal in structure. Mild mitral valve regurgitation. No evidence of mitral valve stenosis. There is no evidence of mitral valve vegetation. Tricuspid Valve: The tricuspid valve is normal in structure. Tricuspid valve regurgitation is trivial. No evidence of tricuspid stenosis. There is no evidence of tricuspid valve vegetation. Aortic Valve: The aortic valve is normal in structure. Aortic valve regurgitation is not visualized. No aortic stenosis is present. There is no evidence of aortic valve vegetation. Pulmonic  Valve: The pulmonic valve was normal in structure. Pulmonic valve regurgitation is not visualized. No evidence of pulmonic stenosis. There is no evidence of pulmonic valve vegetation. Aorta: The aortic root is normal in size and structure. Venous: The inferior vena cava is normal in size with greater than 50% respiratory variability, suggesting right atrial pressure of 3 mmHg. IAS/Shunts: No atrial level shunt detected by color flow Doppler. Agitated saline contrast was given intravenously to evaluate for  intracardiac shunting. Agitated saline contrast bubble study was negative, with no evidence of any interatrial shunt. There  is no evidence of a patent foramen ovale. There is no evidence of an atrial septal defect. Julien Nordmann MD Electronically signed by Julien Nordmann MD Signature Date/Time: 03/29/2023/6:42:09 PM    Final    ECHOCARDIOGRAM COMPLETE  Result Date: 03/27/2023    ECHOCARDIOGRAM REPORT   Patient Name:   WHITNEY REDDINGER Dowse Date of Exam: 03/27/2023 Medical Rec #:  [id number]     Height:       76.0 in Accession #:    [id number]    Weight:       378.7 lb Date of Birth:  1971-04-10     BSA:          2.906 m Patient Age:    52 years      BP:           147/98 mmHg Patient Gender: M             HR:           68 bpm. Exam Location:  [facility] Procedure: 2D Echo, Cardiac Doppler and Color Doppler Indications:    Bacteremia R78.81  History:        Patient has prior history of Echocardiogram examinations, most                 recent 10/16/2017. COPD and ESRD on dialysis; Peripheral vascular                 disease; Risk Factors:Hypertension, Dyslipidemia, Diabetes and                 Former Smoker.  Sonographer:    Dondra Prader RVT RCS Referring Phys: [id number] Lynn Ito  Sonographer Comments: Technically challenging study due to limited acoustic windows and patient is obese. Image acquisition challenging due to patient body habitus. IMPRESSIONS  1. Left ventricular ejection fraction, by estimation, is 60 to 65%. The left ventricle has normal function. The left ventricle has no regional wall motion abnormalities. The left ventricular internal cavity size was mildly dilated. There is moderate left ventricular hypertrophy. Left ventricular diastolic parameters are consistent with Grade II diastolic dysfunction (pseudonormalization).  2. Right ventricular systolic function is normal. The right ventricular size is normal.  3. Left atrial size was moderately dilated.  4. The mitral valve is normal in structure.  Trivial mitral valve regurgitation. No evidence of mitral stenosis.  5. The aortic valve is grossly normal. There is moderate calcification of the aortic valve. There is mild thickening of the aortic valve. Aortic valve regurgitation is not visualized. Aortic valve sclerosis/calcification is present, without any evidence  of aortic stenosis.  6. Aortic dilatation noted. There is borderline dilatation of the ascending aorta, measuring 38 mm.  7. The inferior vena cava is normal in size with greater than 50% respiratory variability, suggesting right atrial pressure of 3 mmHg. Comparison(s): No significant change from prior study. 10/16/17- EF 55-60%. Conclusion(s)/Recommendation(s): No evidence of valvular vegetations on this  transthoracic echocardiogram. Consider a transesophageal echocardiogram to exclude infective endocarditis if clinically indicated. FINDINGS  Left Ventricle: Left ventricular ejection fraction, by estimation, is 60 to 65%. The left ventricle has normal function. The left ventricle has no regional wall motion abnormalities. The left ventricular internal cavity size was mildly dilated. There is  moderate left ventricular hypertrophy. Left ventricular diastolic parameters are consistent with Grade II diastolic dysfunction (pseudonormalization). Right Ventricle: The right ventricular size is normal. Right vetricular wall thickness was not well visualized. Right ventricular systolic function is normal. Left Atrium: Left atrial size was moderately dilated. Right Atrium: Right atrial size was normal in size. Pericardium: There is no evidence of pericardial effusion. Mitral Valve: The mitral valve is normal in structure. There is mild thickening of the mitral valve leaflet(s). There is mild calcification of the mitral valve leaflet(s). Trivial mitral valve regurgitation. No evidence of mitral valve stenosis. Tricuspid Valve: The tricuspid valve is not well visualized. Tricuspid valve regurgitation is  trivial. No evidence of tricuspid stenosis. Aortic Valve: The aortic valve is grossly normal. There is moderate calcification of the aortic valve. There is mild thickening of the aortic valve. Aortic valve regurgitation is not visualized. Aortic valve sclerosis/calcification is present, without any evidence of aortic stenosis. Aortic valve mean gradient measures 6.0 mmHg. Aortic valve peak gradient measures 11.4 mmHg. Aortic valve area, by VTI measures 2.70 cm. Pulmonic Valve: The pulmonic valve was not well visualized. Pulmonic valve regurgitation is trivial. No evidence of pulmonic stenosis. Aorta: Aortic dilatation noted. There is borderline dilatation of the ascending aorta, measuring 38 mm. Venous: The inferior vena cava is normal in size with greater than 50% respiratory variability, suggesting right atrial pressure of 3 mmHg. IAS/Shunts: The atrial septum is grossly normal.  LEFT VENTRICLE PLAX 2D LVIDd:         5.60 cm   Diastology LVIDs:         3.35 cm   LV e' medial:    7.07 cm/s LV PW:         1.50 cm   LV E/e' medial:  16.8 LV IVS:        1.30 cm   LV e' lateral:   10.70 cm/s LVOT diam:     2.00 cm   LV E/e' lateral: 11.1 LV SV:         100 LV SV Index:   34 LVOT Area:     3.14 cm  RIGHT VENTRICLE             IVC RV S prime:     17.30 cm/s  IVC diam: 2.00 cm TAPSE (M-mode): 2.9 cm LEFT ATRIUM              Index        RIGHT ATRIUM           Index LA diam:        4.30 cm  1.48 cm/m   RA Area:     15.20 cm LA Vol (A2C):   96.8 ml  33.31 ml/m  RA Volume:   38.00 ml  13.08 ml/m LA Vol (A4C):   104.3 ml 35.88 ml/m LA Biplane Vol: 109.0 ml 37.51 ml/m  AORTIC VALVE                     PULMONIC VALVE AV Area (Vmax):    2.64 cm      PV Vmax:       1.01 m/s AV Area (Vmean):  2.68 cm      PV Peak grad:  4.1 mmHg AV Area (VTI):     2.70 cm AV Vmax:           169.00 cm/s AV Vmean:          110.000 cm/s AV VTI:            0.371 m AV Peak Grad:      11.4 mmHg AV Mean Grad:      6.0 mmHg LVOT Vmax:          142.00 cm/s LVOT Vmean:        93.700 cm/s LVOT VTI:          0.319 m LVOT/AV VTI ratio: 0.86  AORTA Ao Root diam: 3.20 cm Ao Asc diam:  3.80 cm MITRAL VALVE MV Area (PHT): 3.72 cm     SHUNTS MV Decel Time: 204 msec     Systemic VTI:  0.32 m MV E velocity: 119.00 cm/s  Systemic Diam: 2.00 cm MV A velocity: 89.30 cm/s MV E/A ratio:  1.33 Jodelle Red MD Electronically signed by Jodelle Red MD Signature Date/Time: 03/27/2023/1:04:52 PM    Final    DG Chest Portable 1 View  Result Date: 03/24/2023 CLINICAL DATA:  Sepsis. EXAM: PORTABLE CHEST 1 VIEW COMPARISON:  None Available. FINDINGS: The heart is mildly enlarged. Mild pulmonary vascular congestion. No focal consolidation or large pleural effusion. No acute osseous abnormality. IMPRESSION: Mild cardiomegaly with mild pulmonary vascular congestion. No focal consolidation or pleural effusion. Electronically Signed   By: Larose Hires D.O.   On: 03/24/2023 10:46   DG Foot Complete Left  Result Date: 03/24/2023 CLINICAL DATA:  Evaluate for osteomyelitis. Patient complains tube wounds to the left foot. EXAM: LEFT FOOT - COMPLETE 3+ VIEW COMPARISON:  None Available. FINDINGS: There is no evidence of fracture or dislocation. There is no cortical erosion or periosteal reaction to suggest osteomyelitis. Marked soft tissue swelling of the foot and ankle. IMPRESSION: 1. No radiographic evidence of osteomyelitis. Evaluation of early osteomyelitis is limited on radiographs, if there is clinical concern further evaluation with MR examination is suggested. 2. Marked soft tissue swelling of the foot and ankle. Electronically Signed   By: Larose Hires D.O.   On: 03/24/2023 10:44     Assessment/Plan 1. Venous stasis ulcer of ankle with fat layer exposed, unspecified laterality, unspecified whether varicose veins present (HCC) Patient is doing very well his venous ulcer is now almost healed it is very superficial and uninfected.  The ulcer on the top of  his foot is now healed.  2. Chronic venous insufficiency Recommend:  No surgery or intervention at this point in time.    I have reviewed my discussion with the patient regarding lymphedema and why it  causes symptoms.  Patient will continue wearing graduated compression on a daily basis. The patient should put the compression on first thing in the morning and removing them in the evening. The patient should not sleep in the compression.   In addition, behavioral modification throughout the day will be continued.  This will include frequent elevation (such as in a recliner), use of over the counter pain medications as needed and exercise such as walking.  The systemic causes for chronic edema such as liver, kidney and cardiac etiologies does not appear to have significant changed over the past year.    The patient will continue aggressive use of the  lymph pump.  This will continue to improve the edema  control and prevent sequela such as ulcers and infections.    3. End stage renal disease (HCC) Recommend:  The patient is doing well and currently has adequate dialysis access. The patient's dialysis center is not reporting any access issues. Flow pattern is stable when compared to the prior ultrasound.  The patient should have a duplex ultrasound of the dialysis access in 3 months. The patient will follow-up with me in the office after each ultrasound   - VAS US DUPLEX DIALYSIS ACCESS (AVF, AVG); Future  4. Resistant hypertension Continue antihypertensive medications as already ordered, these medications have been reviewed and there are no changes at this time.  5. Type 2 diabetes mellitus with hyperglycemia, with long-term current use of insulin (HCC) Continue hypoglycemic medications as already ordered, these medications have been reviewed and there are no changes at this time.  Hgb A1C to be monitored as already arranged by primary service  6. Chronic obstructive pulmonary  disease, unspecified COPD type (HCC) Continue pulmonary medications and aerosols as already ordered, these medications have been reviewed and there are no changes at this time.     [name], MD  04/09/2023 2:09 PM

## 2023-04-11 ENCOUNTER — Encounter (INDEPENDENT_AMBULATORY_CARE_PROVIDER_SITE_OTHER): Payer: Self-pay | Admitting: Vascular Surgery

## 2023-04-11 ENCOUNTER — Ambulatory Visit (INDEPENDENT_AMBULATORY_CARE_PROVIDER_SITE_OTHER): Payer: Medicare HMO | Admitting: Vascular Surgery

## 2023-04-11 VITALS — BP 155/80 | HR 58 | Resp 18 | Ht 76.0 in | Wt 389.0 lb

## 2023-04-11 DIAGNOSIS — J449 Chronic obstructive pulmonary disease, unspecified: Secondary | ICD-10-CM | POA: Diagnosis not present

## 2023-04-11 DIAGNOSIS — I1A0 Resistant hypertension: Secondary | ICD-10-CM

## 2023-04-11 DIAGNOSIS — E1165 Type 2 diabetes mellitus with hyperglycemia: Secondary | ICD-10-CM | POA: Diagnosis not present

## 2023-04-11 DIAGNOSIS — Z794 Long term (current) use of insulin: Secondary | ICD-10-CM | POA: Diagnosis not present

## 2023-04-11 DIAGNOSIS — I83003 Varicose veins of unspecified lower extremity with ulcer of ankle: Secondary | ICD-10-CM

## 2023-04-11 DIAGNOSIS — I872 Venous insufficiency (chronic) (peripheral): Secondary | ICD-10-CM

## 2023-04-11 DIAGNOSIS — L97302 Non-pressure chronic ulcer of unspecified ankle with fat layer exposed: Secondary | ICD-10-CM

## 2023-04-11 DIAGNOSIS — N186 End stage renal disease: Secondary | ICD-10-CM | POA: Diagnosis not present

## 2023-04-12 DIAGNOSIS — N186 End stage renal disease: Secondary | ICD-10-CM | POA: Diagnosis not present

## 2023-04-12 DIAGNOSIS — R7881 Bacteremia: Secondary | ICD-10-CM | POA: Diagnosis not present

## 2023-04-12 DIAGNOSIS — L97929 Non-pressure chronic ulcer of unspecified part of left lower leg with unspecified severity: Secondary | ICD-10-CM | POA: Diagnosis not present

## 2023-04-12 DIAGNOSIS — Z992 Dependence on renal dialysis: Secondary | ICD-10-CM | POA: Diagnosis not present

## 2023-04-12 DIAGNOSIS — L03116 Cellulitis of left lower limb: Secondary | ICD-10-CM | POA: Diagnosis not present

## 2023-04-12 DIAGNOSIS — N2581 Secondary hyperparathyroidism of renal origin: Secondary | ICD-10-CM | POA: Diagnosis not present

## 2023-04-12 DIAGNOSIS — L03115 Cellulitis of right lower limb: Secondary | ICD-10-CM | POA: Diagnosis not present

## 2023-04-13 ENCOUNTER — Encounter: Payer: Medicare HMO | Admitting: Internal Medicine

## 2023-04-13 DIAGNOSIS — I890 Lymphedema, not elsewhere classified: Secondary | ICD-10-CM | POA: Diagnosis not present

## 2023-04-13 DIAGNOSIS — I87312 Chronic venous hypertension (idiopathic) with ulcer of left lower extremity: Secondary | ICD-10-CM | POA: Diagnosis not present

## 2023-04-13 DIAGNOSIS — E11621 Type 2 diabetes mellitus with foot ulcer: Secondary | ICD-10-CM | POA: Diagnosis not present

## 2023-04-13 DIAGNOSIS — Z79899 Other long term (current) drug therapy: Secondary | ICD-10-CM | POA: Diagnosis not present

## 2023-04-13 DIAGNOSIS — E11622 Type 2 diabetes mellitus with other skin ulcer: Secondary | ICD-10-CM | POA: Diagnosis not present

## 2023-04-13 DIAGNOSIS — I87321 Chronic venous hypertension (idiopathic) with inflammation of right lower extremity: Secondary | ICD-10-CM | POA: Diagnosis not present

## 2023-04-13 DIAGNOSIS — L97822 Non-pressure chronic ulcer of other part of left lower leg with fat layer exposed: Secondary | ICD-10-CM | POA: Diagnosis not present

## 2023-04-13 DIAGNOSIS — L97522 Non-pressure chronic ulcer of other part of left foot with fat layer exposed: Secondary | ICD-10-CM | POA: Diagnosis not present

## 2023-04-13 DIAGNOSIS — Z8619 Personal history of other infectious and parasitic diseases: Secondary | ICD-10-CM | POA: Diagnosis not present

## 2023-04-14 ENCOUNTER — Encounter: Payer: Self-pay | Admitting: Infectious Diseases

## 2023-04-14 ENCOUNTER — Ambulatory Visit: Payer: Medicare HMO | Attending: Infectious Diseases | Admitting: Infectious Diseases

## 2023-04-14 VITALS — BP 109/69 | HR 57 | Temp 97.1°F | Ht 76.0 in | Wt 377.0 lb

## 2023-04-14 DIAGNOSIS — R7881 Bacteremia: Secondary | ICD-10-CM | POA: Insufficient documentation

## 2023-04-14 DIAGNOSIS — E1151 Type 2 diabetes mellitus with diabetic peripheral angiopathy without gangrene: Secondary | ICD-10-CM | POA: Diagnosis not present

## 2023-04-14 DIAGNOSIS — I890 Lymphedema, not elsewhere classified: Secondary | ICD-10-CM | POA: Insufficient documentation

## 2023-04-14 DIAGNOSIS — B954 Other streptococcus as the cause of diseases classified elsewhere: Secondary | ICD-10-CM | POA: Insufficient documentation

## 2023-04-14 DIAGNOSIS — N2581 Secondary hyperparathyroidism of renal origin: Secondary | ICD-10-CM | POA: Diagnosis not present

## 2023-04-14 DIAGNOSIS — L03115 Cellulitis of right lower limb: Secondary | ICD-10-CM | POA: Diagnosis not present

## 2023-04-14 DIAGNOSIS — E669 Obesity, unspecified: Secondary | ICD-10-CM | POA: Insufficient documentation

## 2023-04-14 DIAGNOSIS — I120 Hypertensive chronic kidney disease with stage 5 chronic kidney disease or end stage renal disease: Secondary | ICD-10-CM | POA: Diagnosis not present

## 2023-04-14 DIAGNOSIS — Z992 Dependence on renal dialysis: Secondary | ICD-10-CM | POA: Diagnosis not present

## 2023-04-14 DIAGNOSIS — N186 End stage renal disease: Secondary | ICD-10-CM | POA: Diagnosis not present

## 2023-04-14 DIAGNOSIS — Z87891 Personal history of nicotine dependence: Secondary | ICD-10-CM | POA: Insufficient documentation

## 2023-04-14 DIAGNOSIS — Z6841 Body Mass Index (BMI) 40.0 and over, adult: Secondary | ICD-10-CM | POA: Insufficient documentation

## 2023-04-14 DIAGNOSIS — L97929 Non-pressure chronic ulcer of unspecified part of left lower leg with unspecified severity: Secondary | ICD-10-CM | POA: Diagnosis not present

## 2023-04-14 DIAGNOSIS — E1122 Type 2 diabetes mellitus with diabetic chronic kidney disease: Secondary | ICD-10-CM | POA: Diagnosis not present

## 2023-04-14 DIAGNOSIS — Z794 Long term (current) use of insulin: Secondary | ICD-10-CM | POA: Diagnosis not present

## 2023-04-14 DIAGNOSIS — L03116 Cellulitis of left lower limb: Secondary | ICD-10-CM | POA: Diagnosis not present

## 2023-04-14 NOTE — Progress Notes (Signed)
SELLERS, BELD [name] ([id number]) [id number] Page 1 of 8 Visit Report for 04/06/2023 Arrival Information Details Patient Name: Date of Service: [name], [name] 04/06/2023 8:00 [name] Medical Record Number: [id number] Patient Account Number: [id number] Date of Birth/Sex: Treating RN: 1970-12-31 (52 y.o. [name]) [name] Primary Care : [name] Other Clinician: Referring : Treating /Extender: Geralynn Ochs in Treatment: 7 Visit Information History Since Last Visit Added or deleted any medications: No Patient Arrived: Ambulatory Any new allergies or adverse reactions: No Arrival Time: 08:07 Had [name] fall or experienced change in No Accompanied By: self activities of daily living that may affect Transfer Assistance: None risk of falls: Patient Identification Verified: Yes Signs or symptoms of abuse/neglect since last visito No Secondary Verification Process Completed: Yes Hospitalized since last visit: No Patient Requires Transmission-Based Precautions: No Implantable device outside of the clinic excluding No Patient Has Alerts: Yes cellular tissue based products placed in the center Patient Alerts: Patient on Blood Thinner since last visit: ABI R [place] TBI .61 10/11/22 Has Dressing in Place as Prescribed: Yes ABI L [place] TBI .33 10/11/22 Has Compression in Place as Prescribed: Yes Pain Present Now: No Electronic Signature(s) Signed: 04/14/2023 8:04:04 AM By: [name] RN Entered By: [name] on 04/06/2023 08:12:15 -------------------------------------------------------------------------------- Clinic Level of Care Assessment Details Patient Name: Date of Service: [name], [name] 04/06/2023 8:00 [name] Medical Record Number: [id number] Patient Account Number: [id number] Date of Birth/Sex: Treating RN: [birth year] (52 y.o. [name] Primary Care : [name] Other Clinician: Referring  : Treating /Extender: Geralynn Ochs in Treatment: 7 Clinic Level of Care Assessment Items TOOL 1 Quantity Score []  - 0 Use when EandM and Procedure is performed on INITIAL visit ASSESSMENTS - Nursing Assessment / Reassessment []  - 0 General Physical Exam (combine w/ comprehensive assessment (listed just below) when performed on new pt. evals) []  - 0 Comprehensive Assessment (HX, ROS, Risk Assessments, Wounds Hx, etc.) Hamler, [name] ([id number]) [id number] Page 2 of 8 ASSESSMENTS - Wound and Skin Assessment / Reassessment []  - 0 Dermatologic / Skin Assessment (not related to wound area) ASSESSMENTS - Ostomy and/or Continence Assessment and Care []  - 0 Incontinence Assessment and Management []  - 0 Ostomy Care Assessment and Management (repouching, etc.) PROCESS - Coordination of Care []  - 0 Simple Patient / Family Education for ongoing care []  - 0 Complex (extensive) Patient / Family Education for ongoing care []  - 0 Staff obtains Chiropractor, Records, T Results / Process Orders est []  - 0 Staff telephones HHA, Nursing Homes / Clarify orders / etc []  - 0 Routine Transfer to another Facility (non-emergent condition) []  - 0 Routine Hospital Admission (non-emergent condition) []  - 0 New Admissions / Manufacturing engineer / Ordering NPWT Apligraf, etc. , []  - 0 Emergency Hospital Admission (emergent condition) PROCESS - Special Needs []  - 0 Pediatric / Minor Patient Management []  - 0 Isolation Patient Management []  - 0 Hearing / Language / Visual special needs []  - 0 Assessment of Community assistance (transportation, D/C planning, etc.) []  - 0 Additional assistance / Altered mentation []  - 0 Support Surface(s) Assessment (bed, cushion, seat, etc.) INTERVENTIONS - Miscellaneous []  - 0 External ear exam []  - 0 Patient Transfer (multiple staff / Nurse, adult / Similar devices) []  - 0 Simple Staple /  Suture removal (25 or less) []  - 0 Complex Staple / Suture removal (26 or more) []  - 0 Hypo/Hyperglycemic Management (do not  check if billed separately) []  - 0 Ankle / Brachial Index (ABI) - do not check if billed separately Has the patient been seen at the hospital within the last three years: Yes Total Score: 0 Level Of Care: ____ Electronic Signature(s) Signed: 04/14/2023 8:04:04 AM By: [name] RN Entered By: [name] on 04/06/2023 08:46:58 -------------------------------------------------------------------------------- Encounter Discharge Information Details Patient Name: Date of Service: [name], [name] 04/06/2023 8:00 [name] Medical Record Number: [id number] Patient Account Number: [id number] Date of Birth/Sex: Treating RN: [birth year] (52 y.o. [name] Primary Care : [name] Other Clinician: Referring : Treating /Extender: Joselyn Arrow [name] ([id number]) [id number] Page 3 of 8 Weeks in Treatment: 7 Encounter Discharge Information Items Post Procedure Vitals Discharge Condition: Stable Temperature (F): 98.2 Ambulatory Status: Ambulatory Pulse (bpm): 67 Discharge Destination: Home Respiratory Rate (breaths/min): 18 Transportation: Private Auto Blood Pressure (mmHg): 175/76 Accompanied By: self Schedule Follow-up Appointment: Yes Clinical Summary of Care: Electronic Signature(s) Signed: 04/14/2023 8:04:04 AM By: [name] RN Entered By: [name] on 04/06/2023 08:47:48 -------------------------------------------------------------------------------- Lower Extremity Assessment Details Patient Name: Date of Service: [name], [name] 04/06/2023 8:00 [name] Medical Record Number: [id number] Patient Account Number: [id number] Date of Birth/Sex: Treating RN: 09/14/1970 (52 y.o. [name]) [name] Primary Care : [name] Other Clinician: Referring  : Treating /Extender: Geralynn Ochs in Treatment: 7 Edema Assessment Assessed: Kyra Searles: No] Franne Forts: No] [Left: Edema] [Right: :] Calf Left: Right: Point of Measurement: 40 cm From Medial Instep 52 cm 44 cm Ankle Left: Right: Point of Measurement: 10 cm From Medial Instep 40 cm 35 cm Knee To Floor Left: Right: From Medial Instep 52 cm 52 cm Vascular Assessment Pulses: Dorsalis Pedis Palpable: [Left:Yes] [Right:Yes] Extremity colors, hair growth, and conditions: Extremity Color: [Left:Hyperpigmented] [Right:Hyperpigmented] Hair Growth on Extremity: [Left:No] [Right:No] Temperature of Extremity: [Left:Warm] [Right:Warm] Capillary Refill: [Left:< 3 seconds] [Right:< 3 seconds] Dependent Rubor: [Left:No] [Right:No] Blanched when Elevated: [Left:No Yes] [Right:No Yes] Toe Nail Assessment Left: Right: Thick: Yes Yes Discolored: Yes Yes [name], [name] ([id number]) [id number] Page 4 of 8 Deformed: Yes Yes Improper Length and Hygiene: Yes Yes Electronic Signature(s) Signed: 04/14/2023 8:04:04 AM By: [name] RN Entered By: [name] on 04/06/2023 08:28:08 -------------------------------------------------------------------------------- Multi Wound Chart Details Patient Name: Date of Service: [name], [name] 04/06/2023 8:00 [name] Medical Record Number: [id number] Patient Account Number: [id number] Date of Birth/Sex: Treating RN: 1971/03/28 (52 y.o. [name] Primary Care : [name] Other Clinician: Referring : Treating /Extender: Geralynn Ochs in Treatment: 7 Vital Signs Height(in): 76 Pulse(bpm): 67 Weight(lbs): 400 Blood Pressure(mmHg): 175/76 Body Mass Index(BMI): 48.7 Temperature(F): 98.2 Respiratory Rate(breaths/min): 18 [22:Photos:] [N/[name]:N/[name]] Left Lower Leg N/[name] N/[name] Wound Location: Gradually Appeared N/[name] N/[name] Wounding  Event: Diabetic Wound/Ulcer of the Lower N/[name] N/[name] Primary Etiology: Extremity Hypertension, Type II Diabetes, N/[name] N/[name] Comorbid History: Neuropathy 08/30/2022 N/[name] N/[name] Date Acquired: 7 N/[name] N/[name] Weeks of Treatment: Open N/[name] N/[name] Wound Status: No N/[name] N/[name] Wound Recurrence: 7x4.5x0.1 N/[name] N/[name] Measurements L x W x D (cm) 24.74 N/[name] N/[name] (cm) : rea 2.474 N/[name] N/[name] Volume (cm) : 72.70% N/[name] N/[name] % Reduction in [name] rea: 95.50% N/[name] N/[name] % Reduction in Volume: Grade 2 N/[name] N/[name] Classification: Medium N/[name] N/[name] Exudate [name] mount: Serosanguineous N/[name] N/[name] Exudate Type: red, brown N/[name] N/[name] Exudate Color: Large (67-100%) N/[name] N/[name] Granulation [name] mount: Red N/[name] N/[name] Granulation Quality: None Present (0%)  N/[name] N/[name] Necrotic [name] mount: Fat Layer (Subcutaneous Tissue): Yes N/[name] N/[name] Exposed Structures: Fascia: No Tendon: No Muscle: No Joint: No Bone: No None N/[name] N/[name] Epithelialization: [name], [name] ([id number]) [id number] Page 5 of 8 Treatment Notes Electronic Signature(s) Signed: 04/14/2023 8:04:04 AM By: [name] RN Entered By: [name] on 04/06/2023 08:28:25 -------------------------------------------------------------------------------- Multi-Disciplinary Care Plan Details Patient Name: Date of Service: [name], [name] 04/06/2023 8:00 [name] Medical Record Number: [id number] Patient Account Number: [id number] Date of Birth/Sex: Treating RN: 02-08-1971 (52 y.o. [name] Primary Care : [name] Other Clinician: Referring : Treating /Extender: Geralynn Ochs in Treatment: 7 Active Inactive Wound/Skin Impairment Nursing Diagnoses: Knowledge deficit related to ulceration/compromised skin integrity Goals: Patient/caregiver will verbalize understanding of skin care regimen Date Initiated: 03/09/2023 Target Resolution Date: 04/09/2023 Goal Status: Active Ulcer/skin breakdown will have [name] volume reduction of  30% by week 4 Date Initiated: 03/09/2023 Target Resolution Date: 04/09/2023 Goal Status: Active Ulcer/skin breakdown will have [name] volume reduction of 50% by week 8 Date Initiated: 03/09/2023 Target Resolution Date: 05/10/2023 Goal Status: Active Ulcer/skin breakdown will have [name] volume reduction of 80% by week 12 Date Initiated: 03/09/2023 Target Resolution Date: 06/09/2023 Goal Status: Active Ulcer/skin breakdown will heal within 14 weeks Date Initiated: 03/09/2023 Target Resolution Date: 07/10/2023 Goal Status: Active Interventions: Assess patient/caregiver ability to obtain necessary supplies Assess patient/caregiver ability to perform ulcer/skin care regimen upon admission and as needed Assess ulceration(s) every visit Notes: Electronic Signature(s) Signed: 04/14/2023 8:04:04 AM By: [name] RN Entered By: [name] on 04/06/2023 08:28:44 [name], [name] ([id number]) [id number] Page 6 of 8 -------------------------------------------------------------------------------- Pain Assessment Details Patient Name: Date of Service: [name], [name] 04/06/2023 8:00 [name] Medical Record Number: [id number] Patient Account Number: [id number] Date of Birth/Sex: Treating RN: 1971/04/13 (52 y.o. [name]) [name] Primary Care : [name] Other Clinician: Referring : Treating /Extender: Geralynn Ochs in Treatment: 7 Active Problems Location of Pain Severity and Description of Pain Patient Has Paino No Site Locations Pain Management and Medication Current Pain Management: Electronic Signature(s) Signed: 04/14/2023 8:04:04 AM By: [name] RN Entered By: [name] on 04/06/2023 08:13:05 -------------------------------------------------------------------------------- Patient/Caregiver Education Details Patient Name: Date of Service: [name], [name] 8/7/2024andnbsp8:00 [name] Medical Record Number:  [id number] Patient Account Number: [id number] Date of Birth/Gender: Treating RN: [birth year] (52 y.o. [name] Primary Care Physician: [name] Other Clinician: Referring Physician: Treating Physician/Extender: Geralynn Ochs in Treatment: 7 [name], [name] ([id number]) [id number] Page 7 of 8 Education Assessment Education Provided To: Patient Education Topics Provided Wound/Skin Impairment: Handouts: Caring for Your Ulcer Methods: Explain/Verbal Responses: State content correctly Electronic Signature(s) Signed: 04/14/2023 8:04:04 AM By: [name] RN Entered By: [name] on 04/06/2023 08:28:56 -------------------------------------------------------------------------------- Wound Assessment Details Patient Name: Date of Service: [name], [name] 04/06/2023 8:00 [name] Medical Record Number: [id number] Patient Account Number: [id number] Date of Birth/Sex: Treating RN: [birth year] (52 y.o. [name] Primary Care : [name] Other Clinician: Referring : Treating /Extender: Geralynn Ochs in Treatment: 7 Wound Status Wound Number: [id number] Primary Etiology: Diabetic Wound/Ulcer of the Lower Extremity Wound Location: Left Lower Leg Wound Status: Open Wounding Event: Gradually Appeared Comorbid History: Hypertension, Type II Diabetes, Neuropathy Date Acquired: 08/30/2022 Weeks Of Treatment: 7 Clustered Wound: No Photos Wound Measurements Length: (cm) 7 Width: (cm) 4.5 Depth: (cm) 0.1 Area: (cm) 24.74 Volume: (cm)  2.474 % Reduction in Area: 72.7% % Reduction in Volume: 95.5% Epithelialization: None Tunneling: No Undermining: No Wound Description Classification: Grade 2 Exudate Amount: Medium Exudate Type: [name], [name] ([id number]) Exudate Color: red, brown Foul Odor After Cleansing: No Slough/Fibrino  No [id number] Page 8 of 8 Wound Bed Granulation Amount: Large (67-100%) Exposed Structure Granulation Quality: Red Fascia Exposed: No Necrotic Amount: None Present (0%) Fat Layer (Subcutaneous Tissue) Exposed: Yes Tendon Exposed: No Muscle Exposed: No Joint Exposed: No Bone Exposed: No Electronic Signature(s) Signed: 04/14/2023 8:04:04 AM By: [name] RN Entered By: [name] on 04/06/2023 08:23:20 -------------------------------------------------------------------------------- Vitals Details Patient Name: Date of Service: [name], [name] 04/06/2023 8:00 [name] Medical Record Number: [id number] Patient Account Number: [id number] Date of Birth/Sex: Treating RN: [birth year] (52 y.o. [name]) [name] Primary Care : [name] Other Clinician: Referring : Treating /Extender: Geralynn Ochs in Treatment: 7 Vital Signs Time Taken: 08:12 Temperature (F): 98.2 Height (in): 76 Pulse (bpm): 67 Weight (lbs): 400 Respiratory Rate (breaths/min): 18 Body Mass Index (BMI): 48.7 Blood Pressure (mmHg): 175/76 Reference Range: 80 - 120 mg / dl Electronic Signature(s) Signed: 04/14/2023 8:04:04 AM By: [name] RN Entered By: [name] on 04/06/2023 08:12:45

## 2023-04-14 NOTE — Progress Notes (Signed)
NAME: [name]  DOB: May 10, 1971  MRN: [id number]  Date/Time: 04/14/2023 12:00 PM  Subjective:   ? [name] is a 52 y.o. with a history of DM on lantus, retinopathy, HTN,ESRD on dialysis, lymphedema legs left worse than right, recent streptococcus G bacteremia on cefazolin 4 weeks  end date 04/20/23, DM, HTN, increased BMI Pt here for follow up and doing well Followed at wound clinic for lymphedema and has compression bandage Also has lymphedema pump  Past Medical History:  Diagnosis Date   Anemia    Chronic kidney disease    14% FUNCTION   Diabetes mellitus without complication (HCC)    Diabetic retinopathy (HCC)    Dyspnea    DOE   Dysrhythmia    End stage renal disease (HCC)    History of orthopnea    error wrong patietn   Hypercholesteremia    error   Hyperkalemia    error wrong patient   Hypertension    Long-term insulin use (HCC)    Lymphedema of leg    Metabolic encephalopathy    error   Microalbuminuria    MSSA (methicillin susceptible Staphylococcus aureus)    Obesity    PVD (peripheral vascular disease) (HCC)    Respiratory failure (HCC)    Error   Sepsis (HCC)    error   Sleep apnea    no CPAP   UTI (urinary tract infection)    error   Venous ulcer (HCC)    Vitamin D deficiency     Past Surgical History:  Procedure Laterality Date   A/V FISTULAGRAM Right 10/24/2018   Procedure: A/V FISTULAGRAM;  Surgeon: Renford Dills, MD;  Location: [facility];  Service: Cardiovascular;  Laterality: Right;   A/V FISTULAGRAM Right 11/24/2018   Procedure: A/V FISTULAGRAM;  Surgeon: Renford Dills, MD;  Location: [facility];  Service: Cardiovascular;  Laterality: Right;   A/V FISTULAGRAM Left 07/31/2019   Procedure: A/V FISTULAGRAM;  Surgeon: Renford Dills, MD;  Location: [facility];  Service: Cardiovascular;  Laterality: Left;   A/V FISTULAGRAM Left 12/11/2019   Procedure: A/V FISTULAGRAM;  Surgeon: Renford Dills,  MD;  Location: [facility];  Service: Cardiovascular;  Laterality: Left;   A/V SHUNT INTERVENTION Right 02/21/2019   Procedure: A/V SHUNT INTERVENTION;  Surgeon: Renford Dills, MD;  Location: [facility];  Service: Cardiovascular;  Laterality: Right;   APPLICATION OF WOUND VAC Left 10/03/2017   Procedure: APPLICATION OF WOUND VAC;  Surgeon: Annice Needy, MD;  Location: [facility];  Service: General;  Laterality: Left;   APPLICATION OF WOUND VAC Left 10/11/2017   Procedure: APPLICATION OF WOUND VAC;  Surgeon: Renford Dills, MD;  Location: [facility];  Service: Vascular;  Laterality: Left;   APPLICATION OF WOUND VAC Left 10/14/2017   Procedure: WOUND VAC CHANGE;  Surgeon: Renford Dills, MD;  Location: [facility];  Service: Vascular;  Laterality: Left;  left lower leg   APPLICATION OF WOUND VAC Left 10/18/2017   Procedure: WOUND VAC CHANGE;  Surgeon: Renford Dills, MD;  Location: [facility];  Service: Vascular;  Laterality: Left;   APPLICATION OF WOUND VAC Left 10/07/2017   Procedure: APPLICATION OF WOUND VAC;  Surgeon: Renford Dills, MD;  Location: [facility];  Service: Vascular;  Laterality: Left;   APPLICATION OF WOUND VAC Left 11/17/2022   Procedure: Wound vac exchange;  Surgeon: Renford Dills, MD;  Location: [facility];  Service: Vascular;  Laterality: Left;   APPLICATION OF WOUND VAC Left 11/10/2022   Procedure: APPLICATION OF WOUND VAC;  Surgeon: Renford Dills, MD;  Location: [facility];  Service: Vascular;  Laterality: Left;   APPLICATION OF WOUND VAC Left 11/24/2022   Procedure: WOUND VAC REMOVAL;  Surgeon: Renford Dills, MD;  Location: [facility];  Service: Vascular;  Laterality: Left;   APPLICATION OF WOUND VAC Left 11/12/2022   Procedure: APPLICATION OF WOUND VAC;  Surgeon: Renford Dills, MD;  Location: [facility];  Service: Vascular;  Laterality: Left;   APPLICATION OF WOUND VAC Left 01/06/2023   Procedure: APPLICATION OF WOUND VAC;  Surgeon: Renford Dills, MD;  Location: [facility];  Service: Vascular;  Laterality: Left;   AV FISTULA INSERTION W/ RF MAGNETIC GUIDANCE Right 07/19/2018   Procedure: AV FISTULA INSERTION W/RF MAGNETIC GUIDANCE;  Surgeon: Renford Dills, MD;  Location: [facility];  Service: Cardiovascular;  Laterality: Right;   AV FISTULA PLACEMENT Left 05/11/2019   Procedure: ARTERIOVENOUS (AV) FISTULA CREATION (RADIOCEPHALIC );  Surgeon: Renford Dills, MD;  Location: [facility];  Service: Vascular;  Laterality: Left;   AV FISTULA PLACEMENT Left 02/20/2020   Procedure: INSERTION OF ARTERIOVENOUS (AV) GORE-TEX GRAFT LEFT ARM ( FOREARM LOOP );  Surgeon: Renford Dills, MD;  Location: [facility];  Service: Vascular;  Laterality: Left;   CATARACT EXTRACTION W/PHACO Left 11/01/2018   Procedure: CATARACT EXTRACTION PHACO AND INTRAOCULAR LENS PLACEMENT (IOC)-LEFT, DIABETIC-INSULIN DEPENDENT;  Surgeon: Nevada Crane, MD;  Location: [facility];  Service: Ophthalmology;  Laterality: Left;  [place] 00:41.8 CDE 4.61 Fluid Pack Lot # [id number]   CATARACT EXTRACTION W/PHACO Right 04/27/2019   Procedure: CATARACT EXTRACTION PHACO AND INTRAOCULAR LENS PLACEMENT (IOC);  Surgeon: Nevada Crane, MD;  Location: [facility];  Service: Ophthalmology;  Laterality: Right;  [place] 00:34 CDE 1.97 FLUID PACK LOT # [id number]    COLONOSCOPY WITH PROPOFOL N/A 05/12/2020   Procedure: COLONOSCOPY WITH PROPOFOL;  Surgeon: Tol[place], Boykin Nearing, MD;  Location: [facility];  Service: Gastroenterology;  Laterality: N/A;   DIALYSIS/PERMA CATHETER INSERTION N/A 02/23/2019   Procedure: DIALYSIS/PERMA CATHETER INSERTION;  Surgeon: Annice Needy, MD;  Location: [facility];  Service: Cardiovascular;  Laterality: N/A;   DIALYSIS/PERMA CATHETER REMOVAL N/A 04/03/2020   Procedure: DIALYSIS/PERMA CATHETER REMOVAL;  Surgeon: Renford Dills, MD;  Location: [facility];  Service: Cardiovascular;  Laterality: N/A;   DIALYSIS/PERMA CATHETER REMOVAL  N/A 02/03/2022   Procedure: DIALYSIS/PERMA CATHETER REMOVAL;  Surgeon: Renford Dills, MD;  Location: [facility];  Service: Cardiovascular;  Laterality: N/A;   I & D EXTREMITY Left 10/11/2017   Procedure: IRRIGATION AND DEBRIDEMENT EXTREMITY;  Surgeon: Renford Dills, MD;  Location: [facility];  Service: Vascular;  Laterality: Left;   INCISION AND DRAINAGE ABSCESS Right 10/07/2017   Procedure: INCISION AND DRAINAGE ABSCESS;  Surgeon: Renford Dills, MD;  Location: [facility];  Service: Vascular;  Laterality: Right;   INCISION AND DRAINAGE OF WOUND Left 11/17/2022   Procedure: DEBRIDEMENT WOUND;  Surgeon: Renford Dills, MD;  Location: [facility];  Service: Vascular;  Laterality: Left;   INCISION AND DRAINAGE OF WOUND Left 11/24/2022   Procedure: DEBRIDEMENT WOUND LEFT LOWER EXTREMITY;  Surgeon: Renford Dills, MD;  Location: [facility];  Service: Vascular;  Laterality: Left;   IRRIGATION AND DEBRIDEMENT ABSCESS Left 10/03/2017   Procedure: IRRIGATION AND DEBRIDEMENT ABSCESS with debridement of skin, soft tissue, muscle 50sq cm;  Surgeon: Annice Needy, MD;  Location: [facility];  Service: General;  Laterality: Left;   LOWER EXTREMITY ANGIOGRAPHY Left 10/15/2022   Procedure: Lower Extremity Angiography;  Surgeon: Renford Dills, MD;  Location: [facility];  Service: Cardiovascular;  Laterality: Left;   TEE WITHOUT CARDIOVERSION N/A 03/29/2023   Procedure: TRANSESOPHAGEAL ECHOCARDIOGRAM;  Surgeon: Antonieta Iba, MD;  Location: [facility];  Service: Cardiovascular;  Laterality: N/A;   TEMPORARY DIALYSIS CATHETER  02/20/2019   Procedure: TEMPORARY DIALYSIS CATHETER;  Surgeon: Renford Dills, MD;  Location: [facility];  Service: Cardiovascular;;   UPPER EXTREMITY ANGIOGRAPHY Left 09/18/2019   Procedure: UPPER EXTREMITY ANGIOGRAPHY;  Surgeon: Renford Dills, MD;  Location: [facility];  Service: Cardiovascular;  Laterality: Left;   WOUND DEBRIDEMENT Left  11/10/2022   Procedure: DEBRIDEMENT WOUND;  Surgeon: Renford Dills, MD;  Location: [facility];  Service: Vascular;  Laterality: Left;   WOUND DEBRIDEMENT Left 11/12/2022   Procedure: DEBRIDEMENT WOUND;  Surgeon: Renford Dills, MD;  Location: [facility];  Service: Vascular;  Laterality: Left;   WOUND DEBRIDEMENT Left 01/06/2023   Procedure: DEBRIDEMENT WOUND;  Surgeon: Renford Dills, MD;  Location: [facility];  Service: Vascular;  Laterality: Left;    Social History   Socioeconomic History   Marital status: Divorced    Spouse name: Not on file   Number of children: Not on file   Years of education: Not on file   Highest education level: Not on file  Occupational History   Not on file  Tobacco Use   Smoking status: Former    Types: Cigars    Passive exposure: Past   Smokeless tobacco: Former    Quit date: 09/30/2017   Tobacco comments:    occasional  Vaping Use   Vaping status: Never Used  Substance and Sexual Activity   Alcohol use: Yes    Alcohol/week: 1.0 standard drink of alcohol    Types: 1 Cans of beer per week    Comment: beer   Drug use: Not Currently   Sexual activity: Yes  Other Topics Concern   Not on file  Social History Narrative   Lives alone.  Significant other, Debbie.    Social Determinants of Health   Financial Resource Strain: Not on file  Food Insecurity: No Food Insecurity (03/24/2023)   Hunger Vital Sign    Worried About Running Out of Food in the Last Year: Never true    Ran Out of Food in the Last Year: Never true  Transportation Needs: No Transportation Needs (03/24/2023)   PRAPARE - Administrator, Civil Service (Medical): No    Lack of Transportation (Non-Medical): No  Physical Activity: Not on file  Stress: Not on file  Social Connections: Not on file  Intimate Partner Violence: Not At Risk (03/24/2023)   Humiliation, Afraid, Rape, and Kick questionnaire    Fear of Current or Ex-Partner: No    Emotionally Abused: No     Physically Abused: No    Sexually Abused: No    Family History  Problem Relation Age of Onset   Diabetes Father    Kidney disease Father    Diabetes Daughter    Allergies  Allergen Reactions   Lisinopril Swelling    Lips mouth    Furosemide Cough    Light headed, blurry vision, weakness.   Latex Itching   Adhesive [Tape] Itching and Rash   I? Current Outpatient Medications  Medication  Sig Dispense Refill   ACCU-CHEK GUIDE test strip USE 1 STRIP TOTAL 2 TIMES DAILY FOR 90 DAYS     acetaminophen (TYLENOL) 325 MG tablet Take 2 tablets (650 mg total) by mouth every 6 (six) hours as needed for mild pain (or Fever >/= 101).     allopurinol (ZYLOPRIM) 100 MG tablet Take 100 mg by mouth daily.     amLODipine (NORVASC) 10 MG tablet Take 5 mg by mouth daily as needed (if bp is 190/90 or higher).      apixaban (ELIQUIS) 5 MG TABS tablet Take 1 tablet (5 mg total) by mouth 2 (two) times daily. 60 tablet 3   atenolol (TENORMIN) 50 MG tablet Take 25 mg by mouth daily as needed (if bp is 190/90 or higher).      atorvastatin (LIPITOR) 80 MG tablet Take 80 mg by mouth at bedtime.     B Complex-C-Folic Acid (RENA-VITE RX) 1 MG TABS Take 1 tablet by mouth 3 (three) times a week.      calcium acetate (PHOSLO) 667 MG capsule Take 1,334-2,001 mg by mouth See admin instructions. Take 2001 mg by mouth with each meal & take 1334 mg by mouth with each snack.     ceFAZolin (ANCEF) IVPB Inject 2-3 g into the vein Every Tuesday,Thursday,and Saturday with dialysis for 22 days. Cefazolin 2gm IV with HD on Tue and THur and Cefazolin 3gm with HD on Sat Indication:  Streptococcus Group G bacteremia Last Day of Therapy:  04/20/2023 (last dose 04/19/2023) Labs - Once weekly:  CBC/D and CMP Fax weekly lab results  promptly to [phone] To be given at HD center     cinacalcet (SENSIPAR) 30 MG tablet Take 30 mg by mouth daily.     CINNAMON PO Take 500 mg by mouth 2 (two) times daily.      clopidogrel (PLAVIX) 75  MG tablet TAKE 1 TABLET BY MOUTH EVERY DAY 90 tablet 1   Continuous Glucose Sensor (DEXCOM G7 SENSOR) MISC USE 1 EACH EVERY 10 (TEN) DAYS     Insulin Glargine (LANTUS SOLOSTAR) 100 UNIT/ML Solostar Pen Inject 20 Units into the skin at bedtime.     lidocaine-prilocaine (EMLA) cream Apply 1 application topically as needed (port access).     Melatonin 5 MG CAPS Take 1 capsule by mouth at bedtime as needed.     Multiple Vitamin (MULTIVITAMIN WITH MINERALS) TABS tablet Take 1 tablet by mouth 3 (three) times a week. Men's Multivitamin     Multiple Vitamins-Minerals (EYE HEALTH PO) Take 1 capsule by mouth daily.      Polyethyl Glycol-Propyl Glycol (SYSTANE OP) Place 1 drop into both eyes daily as needed (dry eyes).     sildenafil (REVATIO) 20 MG tablet Take 1 tablet daily.  Do not take with nitrates. 30 tablet 11   traZODone (DESYREL) 50 MG tablet Take 50 mg by mouth at bedtime.     No current facility-administered medications for this visit.     Abtx:  Anti-infectives (From admission, onward)    None       REVIEW OF SYSTEMS:  Const: negative fever, negative chills, negative weight loss Eyes: negative diplopia or visual changes, negative eye pain ENT: negative coryza, negative sore throat Resp: negative cough, hemoptysis, dyspnea Cards: negative for chest pain, palpitations, lower extremity edema GU: negative for frequency, dysuria and hematuria GI: Negative for abdominal pain, diarrhea, bleeding, constipation Skin: negative for rash and pruritus Heme: negative for easy bruising and gum/nose bleeding MS:  negative for myalgias, arthralgias, back pain and muscle weakness Neurolo:negative for headaches, dizziness, vertigo, memory problems  Psych: negative for feelings of anxiety, depression  Endocrine: DM Allergy/Immunology-as above ?  Objective:  VITALS:  BP 109/69   Pulse (!) 57   Temp (!) 97.1 F (36.2 C) (Temporal)   Ht 6\' 4"  (1.93 m)   Wt (!) 377 lb (171 kg)   BMI 45.89  kg/m    PHYSICAL EXAM:  General: Alert, cooperative, no distress, appears stated age.  Head: Normocephalic, without obvious abnormality, atraumatic. Eyes: Conjunctivae clear, anicteric sclerae. Pupils are equal ENT Nares normal. No drainage or sinus tenderness. Lips, mucosa, and tongue normal. No Thrush Neck: Supple, symmetrical, no adenopathy, thyroid: non tender no carotid bruit and no JVD. Back: No CVA tenderness. Lungs: Clear to auscultation bilaterally. No Wheezing or Rhonchi. No rales. Heart: Regular rate and rhythm, no murmur, rub or gallop. Abdomen: Soft, non-tender,not distended. Bowel sounds normal. No masses Extremities: compresison wraps- not removed Skin: No rashes or lesions. Or bruising Lymph: Cervical, supraclavicular normal. Neurologic: Grossly non-focal Pertinent Labs None available  ? Impression/Recommendation Group G streptococcus bacteremia Left leg was thought to be the source But also on dialysis and there was a concern of the vascualr route on infection TEE neg Is getting 4 weeks of IV cefazolin given during dialysis Finish date 8/21 Will ask dialysis center to fax labs    DM on lantus  ESRD on dilaysis  B/l lymphedema left > rt- followed at wound clinic for the wound he had on the left leg Has triple compression wrap Also has lymphedema pump ? Follow with wound clinic He is discharged from my clinic ___________________________________________________ Discussed with patient

## 2023-04-14 NOTE — Progress Notes (Signed)
MONDELL, KOLK A ([id number]) [id number] Page 1 of 9 Visit Report for 04/13/2023 Arrival Information Details Patient Name: Date of Service: [name], CO [place] A. 04/13/2023 9:15 A M Medical Record Number: [id number] Patient Account Number: [id number] Date of Birth/Sex: Treating RN: 1970-12-30 (52 y.o. [name] Primary Care : [name] Other Clinician: Referring : Treating /Extender: Chauncey Mann, MICHA EL [place] in Treatment: 8 Visit Information History Since Last Visit Added or deleted any medications: No Patient Arrived: Ambulatory Any new allergies or adverse reactions: No Arrival Time: 09:14 Had a fall or experienced change in No Accompanied By: self activities of daily living that may affect Transfer Assistance: None risk of falls: Patient Identification Verified: Yes Hospitalized since last visit: No Secondary Verification Process Completed: Yes Has Dressing in Place as Prescribed: Yes Patient Requires Transmission-Based Precautions: No Pain Present Now: No Patient Has Alerts: Yes Patient Alerts: Patient on Blood Thinner ABI R [place] TBI .61 10/11/22 ABI L [place] TBI .33 10/11/22 Electronic Signature(s) Signed: 04/13/2023 4:42:29 PM By: Angelina Pih Entered By: Angelina Pih on 04/13/2023 09:14:55 -------------------------------------------------------------------------------- Clinic Level of Care Assessment Details Patient Name: Date of Service: [name], [name] 04/13/2023 9:15 A M Medical Record Number: [id number] Patient Account Number: [id number] Date of Birth/Sex: Treating RN: April 24, 1971 (52 y.o. [name] Primary Care : [name] Other Clinician: Referring : Treating /Extender: RO BSO [name], MICHA EL [place] in Treatment: 8 Clinic Level of Care Assessment Items TOOL 1 Quantity Score []  - 0 Use when EandM and Procedure is performed on  INITIAL visit ASSESSMENTS - Nursing Assessment / Reassessment []  - 0 General Physical Exam (combine w/ comprehensive assessment (listed just below) when performed on new pt. evals) []  - 0 Comprehensive Assessment (HX, ROS, Risk Assessments, Wounds Hx, etc.) ASSESSMENTS - Wound and Skin Assessment / Reassessment []  - 0 Dermatologic / Skin Assessment (not related to wound area) Peppel, Harshith A ([id number]) [id number] Page 2 of 9 ASSESSMENTS - Ostomy and/or Continence Assessment and Care []  - 0 Incontinence Assessment and Management []  - 0 Ostomy Care Assessment and Management (repouching, etc.) PROCESS - Coordination of Care []  - 0 Simple Patient / Family Education for ongoing care []  - 0 Complex (extensive) Patient / Family Education for ongoing care []  - 0 Staff obtains Chiropractor, Records, T Results / Process Orders est []  - 0 Staff telephones HHA, Nursing Homes / Clarify orders / etc []  - 0 Routine Transfer to another Facility (non-emergent condition) []  - 0 Routine Hospital Admission (non-emergent condition) []  - 0 New Admissions / Manufacturing engineer / Ordering NPWT Apligraf, etc. , []  - 0 Emergency Hospital Admission (emergent condition) PROCESS - Special Needs []  - 0 Pediatric / Minor Patient Management []  - 0 Isolation Patient Management []  - 0 Hearing / Language / Visual special needs []  - 0 Assessment of Community assistance (transportation, D/C planning, etc.) []  - 0 Additional assistance / Altered mentation []  - 0 Support Surface(s) Assessment (bed, cushion, seat, etc.) INTERVENTIONS - Miscellaneous []  - 0 External ear exam []  - 0 Patient Transfer (multiple staff / Nurse, adult / Similar devices) []  - 0 Simple Staple / Suture removal (25 or less) []  - 0 Complex Staple / Suture removal (26 or more) []  - 0 Hypo/Hyperglycemic Management (do not check if billed separately) []  - 0 Ankle / Brachial Index (ABI) - do not check if  billed separately Has the patient been seen at the hospital within  the last three years: Yes Total Score: 0 Level Of Care: ____ Electronic Signature(s) Signed: 04/13/2023 4:42:29 PM By: Angelina Pih Entered By: Angelina Pih on 04/13/2023 10:49:03 -------------------------------------------------------------------------------- Compression Therapy Details Patient Name: Date of Service: [name], [name] 04/13/2023 9:15 A M Medical Record Number: [id number] Patient Account Number: [id number] Date of Birth/Sex: Treating RN: [birth year] (52 y.o. [name] Primary Care : [name] Other Clinician: Referring : Treating /Extender: RO BSO [name], MICHA EL [place] in Treatment: 8 Compression Therapy Performed for Wound Assessment: Wound #22 Left Lower Leg Performed By: Holly Bodily, RN Sobecki, Jahseh A ([id number]) [id number] Page 3 of 9 Compression Type: Three Layer Post Procedure Diagnosis Same as Pre-procedure Electronic Signature(s) Signed: 04/13/2023 10:48:37 AM By: Angelina Pih Entered By: Angelina Pih on 04/13/2023 10:48:37 -------------------------------------------------------------------------------- Encounter Discharge Information Details Patient Name: Date of Service: [name], [name] 04/13/2023 9:15 A M Medical Record Number: [id number] Patient Account Number: [id number] Date of Birth/Sex: Treating RN: Jun 03, 1971 (52 y.o. [name] Primary Care : [name] Other Clinician: Referring : Treating /Extender: RO BSO [name], MICHA EL [place] in Treatment: 8 Encounter Discharge Information Items Discharge Condition: Stable Ambulatory Status: Ambulatory Discharge Destination: Home Transportation: Private Auto Accompanied By: self Schedule Follow-up Appointment: Yes Clinical Summary of Care: Electronic Signature(s) Signed:  04/13/2023 10:50:42 AM By: Angelina Pih Entered By: Angelina Pih on 04/13/2023 10:50:42 -------------------------------------------------------------------------------- Lower Extremity Assessment Details Patient Name: Date of Service: [name], [name] 04/13/2023 9:15 A M Medical Record Number: [id number] Patient Account Number: [id number] Date of Birth/Sex: Treating RN: 1971/07/22 (52 y.o. [name] Primary Care : [name] Other Clinician: Referring : Treating /Extender: RO BSO [name], MICHA EL [place] in Treatment: 8 Edema Assessment Assessed: [Left: No] [Right: No] Edema: [Left: Ye] [Right: s] H[Left: OLT, Imanol A ([id number])] [Right: [id number] Page 4 of 9] Calf Left: Right: Point of Measurement: 40 cm From Medial Instep 47.8 cm Ankle Left: Right: Point of Measurement: 10 cm From Medial Instep 43 cm Vascular Assessment Pulses: Dorsalis Pedis Doppler Audible: [Left:Yes] Posterior Tibial Doppler Audible: [Left:Yes] Extremity colors, hair growth, and conditions: Extremity Color: [Left:Normal] Hair Growth on Extremity: [Left:No] Temperature of Extremity: [Left:Warm < 3 seconds] Toe Nail Assessment Left: Right: Thick: Yes Discolored: Yes Deformed: No Improper Length and Hygiene: No Notes some pink areas throughout Electronic Signature(s) Signed: 04/13/2023 4:42:29 PM By: Angelina Pih Entered By: Angelina Pih on 04/13/2023 09:29:40 -------------------------------------------------------------------------------- Multi Wound Chart Details Patient Name: Date of Service: [name], [name] 04/13/2023 9:15 A M Medical Record Number: [id number] Patient Account Number: [id number] Date of Birth/Sex: Treating RN: April 14, 1971 (52 y.o. [name] Primary Care : [name] Other Clinician: Referring : Treating /Extender: RO BSO N, MICHA EL [place] in Treatment: 8 Vital Signs Height(in): 76 Pulse(bpm): 64 Weight(lbs): 400 Blood Pressure(mmHg): 150/81 Body Mass Index(BMI): 48.7 Temperature(F): 97.7 Respiratory Rate(breaths/min): 18 [22:Photos:] [N/A:N/A] Left Lower Leg N/A N/A Wound Location: Gradually Appeared N/A N/A Wounding Event: Diabetic Wound/Ulcer of the Lower N/A N/A Primary Etiology: Extremity Hypertension, Type II Diabetes, N/A N/A Comorbid History: Neuropathy 08/30/2022 N/A N/A Date Acquired: 8 N/A N/A Weeks of Treatment: Open N/A N/A Wound Status: No N/A N/A Wound Recurrence: 6x4.7x0.1 N/A N/A Measurements L x W x D (cm) 22.148 N/A N/A A (cm) : rea 2.215 N/A N/A Volume (cm) : 75.60% N/A N/A % Reduction in A rea: 95.90% N/A  N/A % Reduction in Volume: Grade 2 N/A N/A Classification: Medium N/A N/A Exudate A mount: Serosanguineous N/A N/A Exudate Type: red, brown N/A N/A Exudate Color: Large (67-100%) N/A N/A Granulation A mount: Red N/A N/A Granulation Quality: Small (1-33%) N/A N/A Necrotic A mount: Fat Layer (Subcutaneous Tissue): Yes N/A N/A Exposed Structures: Fascia: No Tendon: No Muscle: No Joint: No Bone: No None N/A N/A Epithelialization: build up around edges N/A N/A Assessment Notes: Treatment Notes Electronic Signature(s) Signed: 04/13/2023 4:42:29 PM By: Angelina Pih Previous Signature: 04/13/2023 9:36:26 AM Version By: Angelina Pih Entered By: Angelina Pih on 04/13/2023 09:49:12 -------------------------------------------------------------------------------- Multi-Disciplinary Care Plan Details Patient Name: Date of Service: [name], [name] 04/13/2023 9:15 A M Medical Record Number: [id number] Patient Account Number: [id number] Date of Birth/Sex: Treating RN: 07/19/1971 (53 y.o. [name] Primary Care : [name] Other Clinician: Referring : Treating /Extender: RO BSO [name], MICHA EL [place] in Treatment: 8 Active Inactive Wound/Skin Impairment Nursing Diagnoses: Knowledge deficit related to ulceration/compromised skin integrity Goals: Patient/caregiver will verbalize understanding of skin care regimen Date Initiated: 03/09/2023 Date Inactivated: 04/13/2023 Target Resolution Date: 04/09/2023 Goal Status: Met Ulcer/skin breakdown will have a volume reduction of 30% by week 4 Delpino, Natan A ([id number]) [id number] Page 6 of 9 Date Initiated: 03/09/2023 Date Inactivated: 04/13/2023 Target Resolution Date: 04/09/2023 Goal Status: Met Ulcer/skin breakdown will have a volume reduction of 50% by week 8 Date Initiated: 03/09/2023 Target Resolution Date: 05/10/2023 Goal Status: Active Ulcer/skin breakdown will have a volume reduction of 80% by week 12 Date Initiated: 03/09/2023 Target Resolution Date: 06/09/2023 Goal Status: Active Ulcer/skin breakdown will heal within 14 weeks Date Initiated: 03/09/2023 Target Resolution Date: 07/10/2023 Goal Status: Active Interventions: Assess patient/caregiver ability to obtain necessary supplies Assess patient/caregiver ability to perform ulcer/skin care regimen upon admission and as needed Assess ulceration(s) every visit Notes: Electronic Signature(s) Signed: 04/13/2023 10:49:51 AM By: Angelina Pih Entered By: Angelina Pih on 04/13/2023 10:49:51 -------------------------------------------------------------------------------- Pain Assessment Details Patient Name: Date of Service: [name], [name] 04/13/2023 9:15 A M Medical Record Number: [id number] Patient Account Number: [id number] Date of Birth/Sex: Treating RN: [birth year] (52 y.o. [name] Primary Care : [name] Other Clinician: Referring : Treating /Extender: RO BSO N, MICHA EL [place] in Treatment: 8 Active Problems Location of Pain Severity and Description of Pain Patient Has  Paino No Site Locations Rate the pain. Current Pain Level: 0 Pain Management and Medication Current Pain Management: Electronic Signature(s) Bonello, Kingdom A ([id number]) [phone].pdf Page 7 of 9 Signed: 04/13/2023 4:42:29 PM By: Angelina Pih Entered By: Angelina Pih on 04/13/2023 09:16:41 -------------------------------------------------------------------------------- Patient/Caregiver Education Details Patient Name: Date of Service: [name], [name] 8/14/2024andnbsp9:15 A M Medical Record Number: [id number] Patient Account Number: [id number] Date of Birth/Gender: Treating RN: Apr 06, 1971 (52 y.o. [name] Primary Care Physician: [name] Other Clinician: Referring Physician: Treating Physician/Extender: RO BSO [name], MICHA EL [place] in Treatment: 8 Education Assessment Education Provided To: Patient Education Topics Provided Wound/Skin Impairment: Handouts: Caring for Your Ulcer Methods: Explain/Verbal Responses: State content correctly Electronic Signature(s) Signed: 04/13/2023 4:42:29 PM By: Angelina Pih Entered By: Angelina Pih on 04/13/2023 10:50:04 -------------------------------------------------------------------------------- Wound Assessment Details Patient Name: Date of Service: [name], [name] 04/13/2023 9:15 A M Medical Record Number: [id number] Patient Account Number: [id number] Date of Birth/Sex: Treating RN: [birth year] (52 y.o. [name] Primary Care : [name] Other Clinician: Referring  : Treating /Extender: RO BSO N, MICHA EL [place] in Treatment: 8 Wound Status Wound Number: 22 Primary Etiology: Diabetic Wound/Ulcer of the Lower Extremity Wound Location: Left Lower Leg Wound Status: Open Wounding Event: Gradually Appeared Comorbid History: Hypertension, Type II Diabetes, Neuropathy Date Acquired: 08/30/2022 Weeks Of Treatment:  8 Clustered Wound: No Tata, Kingstin A ([id number]) [id number] Page 8 of 9 Photos Wound Measurements Length: (cm) 6 Width: (cm) 4.7 Depth: (cm) 0.1 Area: (cm) 22.148 Volume: (cm) 2.215 % Reduction in Area: 75.6% % Reduction in Volume: 95.9% Epithelialization: None Tunneling: No Undermining: No Wound Description Classification: Grade 2 Exudate Amount: Medium Exudate Type: Serosanguineous Exudate Color: red, brown Foul Odor After Cleansing: No Slough/Fibrino Yes Wound Bed Granulation Amount: Large (67-100%) Exposed Structure Granulation Quality: Red Fascia Exposed: No Necrotic Amount: Small (1-33%) Fat Layer (Subcutaneous Tissue) Exposed: Yes Necrotic Quality: Adherent Slough Tendon Exposed: No Muscle Exposed: No Joint Exposed: No Bone Exposed: No Assessment Notes build up around edges Treatment Notes Wound #22 (Lower Leg) Wound Laterality: Left Cleanser Soap and Water Discharge Instruction: Gently cleanse wound with antibacterial soap, rinse and pat dry prior to dressing wounds Wound Cleanser Discharge Instruction: Wash your hands with soap and water. Remove old dressing, discard into plastic bag and place into trash. Cleanse the wound with Wound Cleanser prior to applying a clean dressing using gauze sponges, not tissues or cotton balls. Do not scrub or use excessive force. Pat dry using gauze sponges, not tissue or cotton balls. Peri-Wound Care Topical Primary Dressing Silvercel 4 1/4x 4 1/4 (in/in) Discharge Instruction: Apply Silvercel 4 1/4x 4 1/4 (in/in) as instructed Secondary Dressing Zetuvit Plus 4x4 (in/in) Secured With Compression Wrap 3-LAYER WRAP - Profore Lite LF 3 Multilayer Compression Bandaging System Discharge Instruction: Apply 3 multi-layer wrap as prescribed. Compression Stockings Add-Ons Electronic Signature(s) Signed: 04/13/2023 4:42:29 PM By: Angelina Pih Entered By: Angelina Pih on 04/13/2023  09:29:06 Wynter, Kirubel A ([id number]) [id number] Page 9 of 9 -------------------------------------------------------------------------------- Vitals Details Patient Name: Date of Service: [name], [name] 04/13/2023 9:15 A M Medical Record Number: [id number] Patient Account Number: [id number] Date of Birth/Sex: Treating RN: [birth year] (52 y.o. [name] Primary Care : [name] Other Clinician: Referring : Treating /Extender: RO BSO N, MICHA EL [place] in Treatment: 8 Vital Signs Time Taken: 09:16 Temperature (F): 97.7 Height (in): 76 Pulse (bpm): 64 Weight (lbs): 400 Respiratory Rate (breaths/min): 18 Body Mass Index (BMI): 48.7 Blood Pressure (mmHg): 150/81 Reference Range: 80 - 120 mg / dl Electronic Signature(s) Signed: 04/13/2023 4:42:29 PM By: Angelina Pih Entered By: Angelina Pih on 04/13/2023 09:16:35

## 2023-04-14 NOTE — Patient Instructions (Signed)
You are here for follow up of the blood infection with streptococcus . You are completing antibiotic on 8/21 which is given during dialysis. You have seen wound consultant for the legs. Continue to follow up with them

## 2023-04-15 DIAGNOSIS — I120 Hypertensive chronic kidney disease with stage 5 chronic kidney disease or end stage renal disease: Secondary | ICD-10-CM | POA: Diagnosis not present

## 2023-04-15 DIAGNOSIS — I872 Venous insufficiency (chronic) (peripheral): Secondary | ICD-10-CM | POA: Diagnosis not present

## 2023-04-15 DIAGNOSIS — B954 Other streptococcus as the cause of diseases classified elsewhere: Secondary | ICD-10-CM | POA: Diagnosis not present

## 2023-04-15 DIAGNOSIS — L97929 Non-pressure chronic ulcer of unspecified part of left lower leg with unspecified severity: Secondary | ICD-10-CM | POA: Diagnosis not present

## 2023-04-15 DIAGNOSIS — E1022 Type 1 diabetes mellitus with diabetic chronic kidney disease: Secondary | ICD-10-CM | POA: Diagnosis not present

## 2023-04-15 DIAGNOSIS — R7881 Bacteremia: Secondary | ICD-10-CM | POA: Diagnosis not present

## 2023-04-15 DIAGNOSIS — E1051 Type 1 diabetes mellitus with diabetic peripheral angiopathy without gangrene: Secondary | ICD-10-CM | POA: Diagnosis not present

## 2023-04-15 DIAGNOSIS — J449 Chronic obstructive pulmonary disease, unspecified: Secondary | ICD-10-CM | POA: Diagnosis not present

## 2023-04-15 DIAGNOSIS — N186 End stage renal disease: Secondary | ICD-10-CM | POA: Diagnosis not present

## 2023-04-16 DIAGNOSIS — L03116 Cellulitis of left lower limb: Secondary | ICD-10-CM | POA: Diagnosis not present

## 2023-04-16 DIAGNOSIS — R7881 Bacteremia: Secondary | ICD-10-CM | POA: Diagnosis not present

## 2023-04-16 DIAGNOSIS — N2581 Secondary hyperparathyroidism of renal origin: Secondary | ICD-10-CM | POA: Diagnosis not present

## 2023-04-16 DIAGNOSIS — L97929 Non-pressure chronic ulcer of unspecified part of left lower leg with unspecified severity: Secondary | ICD-10-CM | POA: Diagnosis not present

## 2023-04-16 DIAGNOSIS — N186 End stage renal disease: Secondary | ICD-10-CM | POA: Diagnosis not present

## 2023-04-16 DIAGNOSIS — L03115 Cellulitis of right lower limb: Secondary | ICD-10-CM | POA: Diagnosis not present

## 2023-04-16 DIAGNOSIS — Z992 Dependence on renal dialysis: Secondary | ICD-10-CM | POA: Diagnosis not present

## 2023-04-18 ENCOUNTER — Ambulatory Visit: Payer: Self-pay | Admitting: *Deleted

## 2023-04-18 DIAGNOSIS — N186 End stage renal disease: Secondary | ICD-10-CM | POA: Diagnosis not present

## 2023-04-18 DIAGNOSIS — R7881 Bacteremia: Secondary | ICD-10-CM | POA: Diagnosis not present

## 2023-04-18 DIAGNOSIS — I872 Venous insufficiency (chronic) (peripheral): Secondary | ICD-10-CM | POA: Diagnosis not present

## 2023-04-18 DIAGNOSIS — J449 Chronic obstructive pulmonary disease, unspecified: Secondary | ICD-10-CM | POA: Diagnosis not present

## 2023-04-18 DIAGNOSIS — I120 Hypertensive chronic kidney disease with stage 5 chronic kidney disease or end stage renal disease: Secondary | ICD-10-CM | POA: Diagnosis not present

## 2023-04-18 DIAGNOSIS — L97929 Non-pressure chronic ulcer of unspecified part of left lower leg with unspecified severity: Secondary | ICD-10-CM | POA: Diagnosis not present

## 2023-04-18 DIAGNOSIS — E1051 Type 1 diabetes mellitus with diabetic peripheral angiopathy without gangrene: Secondary | ICD-10-CM | POA: Diagnosis not present

## 2023-04-18 DIAGNOSIS — B954 Other streptococcus as the cause of diseases classified elsewhere: Secondary | ICD-10-CM | POA: Diagnosis not present

## 2023-04-18 DIAGNOSIS — E1022 Type 1 diabetes mellitus with diabetic chronic kidney disease: Secondary | ICD-10-CM | POA: Diagnosis not present

## 2023-04-18 NOTE — Patient Outreach (Signed)
  Care Coordination   Follow Up Visit Note   04/18/2023 Name: [name] MRN: [id number] DOB: [birth year]  [name] is a 52 y.o. year old male who sees [name], [name], MD for primary care. I spoke with  [name] by phone today.  What matters to the patients health and wellness today?  Continued wound healing.     Goals Addressed             This Visit's Progress    Effective management of DM   On track    Care Coordination Interventions: Provided education to patient about basic DM disease process Reviewed medications with patient and discussed importance of medication adherence Counseled on importance of regular laboratory monitoring as prescribed Advised patient, providing education and rationale, to check cbg daily and record, calling provider for findings outside established parameters Report he is adherent to HD on Tuesdays, Thursdays, and Saturdays       Effective wound healing of leg wound   On track    Care Coordination Interventions: Evaluation of current treatment plan related to leg wound and patient's adherence to plan as established by provider Advised patient to call [facility] agency to discuss concerns for treatment (regarding availability of supplies) Reviewed scheduled/upcoming provider appointments including HH wound care nursing visit on Mondays and Fridays, wound care office visits every Wednesday Discussed plans with patient for ongoing care management follow up and provided patient with direct contact information for care management team         SDOH assessments and interventions completed:  No     Care Coordination Interventions:  Yes, provided   Interventions Today    Flowsheet Row Most Recent Value  Chronic Disease   Chronic disease during today's visit Chronic Kidney Disease/End Stage Renal Disease (ESRD), Other, Diabetes  [venous leg ulcers, healing]  General Interventions   General Interventions Discussed/Reviewed General  Interventions Reviewed, Doctor Visits  Doctor Visits Discussed/Reviewed Doctor Visits Reviewed, PCP, Specialist, Annual Wellness Visits  [Wound center every Wednesday, [facility] every Monday and Friday, HD every Tuesday, Thursday, Saturday.  Upcoming: Urology, PCP, and Vascular on 10/28]  PCP/Specialist Visits Compliance with follow-up visit  [Seen by ID, signed off]  Education Interventions   Education Provided Provided Education  Provided Verbal Education On Blood Sugar Monitoring, Other, Medication, When to see the doctor  [blood sugar today 116]       Follow up plan: Follow up call scheduled for 9/27    Encounter Outcome:  Pt. Visit Completed   Kemper Durie, RN, MSN, [facility] Care Management Care Management Coordinator [phone]

## 2023-04-19 DIAGNOSIS — L03115 Cellulitis of right lower limb: Secondary | ICD-10-CM | POA: Diagnosis not present

## 2023-04-19 DIAGNOSIS — L03116 Cellulitis of left lower limb: Secondary | ICD-10-CM | POA: Diagnosis not present

## 2023-04-19 DIAGNOSIS — R7881 Bacteremia: Secondary | ICD-10-CM | POA: Diagnosis not present

## 2023-04-19 DIAGNOSIS — Z992 Dependence on renal dialysis: Secondary | ICD-10-CM | POA: Diagnosis not present

## 2023-04-19 DIAGNOSIS — N186 End stage renal disease: Secondary | ICD-10-CM | POA: Diagnosis not present

## 2023-04-19 DIAGNOSIS — N2581 Secondary hyperparathyroidism of renal origin: Secondary | ICD-10-CM | POA: Diagnosis not present

## 2023-04-19 DIAGNOSIS — L97929 Non-pressure chronic ulcer of unspecified part of left lower leg with unspecified severity: Secondary | ICD-10-CM | POA: Diagnosis not present

## 2023-04-20 ENCOUNTER — Encounter (HOSPITAL_BASED_OUTPATIENT_CLINIC_OR_DEPARTMENT_OTHER): Payer: Medicare HMO | Admitting: Internal Medicine

## 2023-04-20 DIAGNOSIS — I87312 Chronic venous hypertension (idiopathic) with ulcer of left lower extremity: Secondary | ICD-10-CM | POA: Diagnosis not present

## 2023-04-20 DIAGNOSIS — Z992 Dependence on renal dialysis: Secondary | ICD-10-CM | POA: Diagnosis not present

## 2023-04-20 DIAGNOSIS — N2581 Secondary hyperparathyroidism of renal origin: Secondary | ICD-10-CM | POA: Diagnosis not present

## 2023-04-20 DIAGNOSIS — N186 End stage renal disease: Secondary | ICD-10-CM | POA: Diagnosis not present

## 2023-04-20 DIAGNOSIS — Z7985 Long-term (current) use of injectable non-insulin antidiabetic drugs: Secondary | ICD-10-CM

## 2023-04-20 DIAGNOSIS — E11621 Type 2 diabetes mellitus with foot ulcer: Secondary | ICD-10-CM | POA: Diagnosis not present

## 2023-04-20 DIAGNOSIS — L97822 Non-pressure chronic ulcer of other part of left lower leg with fat layer exposed: Secondary | ICD-10-CM

## 2023-04-20 DIAGNOSIS — Z79899 Other long term (current) drug therapy: Secondary | ICD-10-CM | POA: Diagnosis not present

## 2023-04-20 DIAGNOSIS — L97522 Non-pressure chronic ulcer of other part of left foot with fat layer exposed: Secondary | ICD-10-CM | POA: Diagnosis not present

## 2023-04-20 DIAGNOSIS — L03116 Cellulitis of left lower limb: Secondary | ICD-10-CM | POA: Diagnosis not present

## 2023-04-20 DIAGNOSIS — I890 Lymphedema, not elsewhere classified: Secondary | ICD-10-CM | POA: Diagnosis not present

## 2023-04-20 DIAGNOSIS — R7881 Bacteremia: Secondary | ICD-10-CM | POA: Diagnosis not present

## 2023-04-20 DIAGNOSIS — L97929 Non-pressure chronic ulcer of unspecified part of left lower leg with unspecified severity: Secondary | ICD-10-CM | POA: Diagnosis not present

## 2023-04-20 DIAGNOSIS — Z8619 Personal history of other infectious and parasitic diseases: Secondary | ICD-10-CM | POA: Diagnosis not present

## 2023-04-20 DIAGNOSIS — E11622 Type 2 diabetes mellitus with other skin ulcer: Secondary | ICD-10-CM

## 2023-04-20 DIAGNOSIS — I87321 Chronic venous hypertension (idiopathic) with inflammation of right lower extremity: Secondary | ICD-10-CM | POA: Diagnosis not present

## 2023-04-20 DIAGNOSIS — L03115 Cellulitis of right lower limb: Secondary | ICD-10-CM | POA: Diagnosis not present

## 2023-04-21 DIAGNOSIS — I120 Hypertensive chronic kidney disease with stage 5 chronic kidney disease or end stage renal disease: Secondary | ICD-10-CM | POA: Diagnosis not present

## 2023-04-21 DIAGNOSIS — E1022 Type 1 diabetes mellitus with diabetic chronic kidney disease: Secondary | ICD-10-CM | POA: Diagnosis not present

## 2023-04-21 DIAGNOSIS — L03115 Cellulitis of right lower limb: Secondary | ICD-10-CM | POA: Diagnosis not present

## 2023-04-21 DIAGNOSIS — I872 Venous insufficiency (chronic) (peripheral): Secondary | ICD-10-CM | POA: Diagnosis not present

## 2023-04-21 DIAGNOSIS — N2581 Secondary hyperparathyroidism of renal origin: Secondary | ICD-10-CM | POA: Diagnosis not present

## 2023-04-21 DIAGNOSIS — L97929 Non-pressure chronic ulcer of unspecified part of left lower leg with unspecified severity: Secondary | ICD-10-CM | POA: Diagnosis not present

## 2023-04-21 DIAGNOSIS — Z992 Dependence on renal dialysis: Secondary | ICD-10-CM | POA: Diagnosis not present

## 2023-04-21 DIAGNOSIS — B954 Other streptococcus as the cause of diseases classified elsewhere: Secondary | ICD-10-CM | POA: Diagnosis not present

## 2023-04-21 DIAGNOSIS — N186 End stage renal disease: Secondary | ICD-10-CM | POA: Diagnosis not present

## 2023-04-21 DIAGNOSIS — L03116 Cellulitis of left lower limb: Secondary | ICD-10-CM | POA: Diagnosis not present

## 2023-04-21 DIAGNOSIS — R7881 Bacteremia: Secondary | ICD-10-CM | POA: Diagnosis not present

## 2023-04-22 DIAGNOSIS — R7881 Bacteremia: Secondary | ICD-10-CM | POA: Diagnosis not present

## 2023-04-22 DIAGNOSIS — E1022 Type 1 diabetes mellitus with diabetic chronic kidney disease: Secondary | ICD-10-CM | POA: Diagnosis not present

## 2023-04-22 DIAGNOSIS — L97929 Non-pressure chronic ulcer of unspecified part of left lower leg with unspecified severity: Secondary | ICD-10-CM | POA: Diagnosis not present

## 2023-04-22 DIAGNOSIS — B954 Other streptococcus as the cause of diseases classified elsewhere: Secondary | ICD-10-CM | POA: Diagnosis not present

## 2023-04-22 DIAGNOSIS — I120 Hypertensive chronic kidney disease with stage 5 chronic kidney disease or end stage renal disease: Secondary | ICD-10-CM | POA: Diagnosis not present

## 2023-04-22 DIAGNOSIS — N186 End stage renal disease: Secondary | ICD-10-CM | POA: Diagnosis not present

## 2023-04-22 DIAGNOSIS — E1051 Type 1 diabetes mellitus with diabetic peripheral angiopathy without gangrene: Secondary | ICD-10-CM | POA: Diagnosis not present

## 2023-04-22 DIAGNOSIS — I872 Venous insufficiency (chronic) (peripheral): Secondary | ICD-10-CM | POA: Diagnosis not present

## 2023-04-22 DIAGNOSIS — J449 Chronic obstructive pulmonary disease, unspecified: Secondary | ICD-10-CM | POA: Diagnosis not present

## 2023-04-23 DIAGNOSIS — L03115 Cellulitis of right lower limb: Secondary | ICD-10-CM | POA: Diagnosis not present

## 2023-04-23 DIAGNOSIS — N2581 Secondary hyperparathyroidism of renal origin: Secondary | ICD-10-CM | POA: Diagnosis not present

## 2023-04-23 DIAGNOSIS — N186 End stage renal disease: Secondary | ICD-10-CM | POA: Diagnosis not present

## 2023-04-23 DIAGNOSIS — Z992 Dependence on renal dialysis: Secondary | ICD-10-CM | POA: Diagnosis not present

## 2023-04-23 DIAGNOSIS — L97929 Non-pressure chronic ulcer of unspecified part of left lower leg with unspecified severity: Secondary | ICD-10-CM | POA: Diagnosis not present

## 2023-04-23 DIAGNOSIS — R7881 Bacteremia: Secondary | ICD-10-CM | POA: Diagnosis not present

## 2023-04-23 DIAGNOSIS — L03116 Cellulitis of left lower limb: Secondary | ICD-10-CM | POA: Diagnosis not present

## 2023-04-26 DIAGNOSIS — L03115 Cellulitis of right lower limb: Secondary | ICD-10-CM | POA: Diagnosis not present

## 2023-04-26 DIAGNOSIS — R7881 Bacteremia: Secondary | ICD-10-CM | POA: Diagnosis not present

## 2023-04-26 DIAGNOSIS — Z992 Dependence on renal dialysis: Secondary | ICD-10-CM | POA: Diagnosis not present

## 2023-04-26 DIAGNOSIS — N186 End stage renal disease: Secondary | ICD-10-CM | POA: Diagnosis not present

## 2023-04-26 DIAGNOSIS — N2581 Secondary hyperparathyroidism of renal origin: Secondary | ICD-10-CM | POA: Diagnosis not present

## 2023-04-26 DIAGNOSIS — L97929 Non-pressure chronic ulcer of unspecified part of left lower leg with unspecified severity: Secondary | ICD-10-CM | POA: Diagnosis not present

## 2023-04-26 DIAGNOSIS — L03116 Cellulitis of left lower limb: Secondary | ICD-10-CM | POA: Diagnosis not present

## 2023-04-26 DIAGNOSIS — Z79899 Other long term (current) drug therapy: Secondary | ICD-10-CM | POA: Diagnosis not present

## 2023-04-27 ENCOUNTER — Encounter: Payer: Medicare HMO | Admitting: Internal Medicine

## 2023-04-27 DIAGNOSIS — I87312 Chronic venous hypertension (idiopathic) with ulcer of left lower extremity: Secondary | ICD-10-CM

## 2023-04-27 DIAGNOSIS — Z8619 Personal history of other infectious and parasitic diseases: Secondary | ICD-10-CM | POA: Diagnosis not present

## 2023-04-27 DIAGNOSIS — I890 Lymphedema, not elsewhere classified: Secondary | ICD-10-CM | POA: Diagnosis not present

## 2023-04-27 DIAGNOSIS — Z79899 Other long term (current) drug therapy: Secondary | ICD-10-CM | POA: Diagnosis not present

## 2023-04-27 DIAGNOSIS — E11622 Type 2 diabetes mellitus with other skin ulcer: Secondary | ICD-10-CM | POA: Diagnosis not present

## 2023-04-27 DIAGNOSIS — L97822 Non-pressure chronic ulcer of other part of left lower leg with fat layer exposed: Secondary | ICD-10-CM | POA: Diagnosis not present

## 2023-04-27 DIAGNOSIS — I87321 Chronic venous hypertension (idiopathic) with inflammation of right lower extremity: Secondary | ICD-10-CM | POA: Diagnosis not present

## 2023-04-27 DIAGNOSIS — E11621 Type 2 diabetes mellitus with foot ulcer: Secondary | ICD-10-CM | POA: Diagnosis not present

## 2023-04-27 DIAGNOSIS — L97522 Non-pressure chronic ulcer of other part of left foot with fat layer exposed: Secondary | ICD-10-CM | POA: Diagnosis not present

## 2023-04-28 DIAGNOSIS — L03116 Cellulitis of left lower limb: Secondary | ICD-10-CM | POA: Diagnosis not present

## 2023-04-28 DIAGNOSIS — L03115 Cellulitis of right lower limb: Secondary | ICD-10-CM | POA: Diagnosis not present

## 2023-04-28 DIAGNOSIS — N2581 Secondary hyperparathyroidism of renal origin: Secondary | ICD-10-CM | POA: Diagnosis not present

## 2023-04-28 DIAGNOSIS — R7881 Bacteremia: Secondary | ICD-10-CM | POA: Diagnosis not present

## 2023-04-28 DIAGNOSIS — L97929 Non-pressure chronic ulcer of unspecified part of left lower leg with unspecified severity: Secondary | ICD-10-CM | POA: Diagnosis not present

## 2023-04-28 DIAGNOSIS — N186 End stage renal disease: Secondary | ICD-10-CM | POA: Diagnosis not present

## 2023-04-28 DIAGNOSIS — Z992 Dependence on renal dialysis: Secondary | ICD-10-CM | POA: Diagnosis not present

## 2023-04-29 DIAGNOSIS — R7881 Bacteremia: Secondary | ICD-10-CM | POA: Diagnosis not present

## 2023-04-29 DIAGNOSIS — N186 End stage renal disease: Secondary | ICD-10-CM | POA: Diagnosis not present

## 2023-04-29 DIAGNOSIS — I120 Hypertensive chronic kidney disease with stage 5 chronic kidney disease or end stage renal disease: Secondary | ICD-10-CM | POA: Diagnosis not present

## 2023-04-29 DIAGNOSIS — E1051 Type 1 diabetes mellitus with diabetic peripheral angiopathy without gangrene: Secondary | ICD-10-CM | POA: Diagnosis not present

## 2023-04-29 DIAGNOSIS — E1022 Type 1 diabetes mellitus with diabetic chronic kidney disease: Secondary | ICD-10-CM | POA: Diagnosis not present

## 2023-04-29 DIAGNOSIS — L97929 Non-pressure chronic ulcer of unspecified part of left lower leg with unspecified severity: Secondary | ICD-10-CM | POA: Diagnosis not present

## 2023-04-29 DIAGNOSIS — I872 Venous insufficiency (chronic) (peripheral): Secondary | ICD-10-CM | POA: Diagnosis not present

## 2023-04-29 DIAGNOSIS — J449 Chronic obstructive pulmonary disease, unspecified: Secondary | ICD-10-CM | POA: Diagnosis not present

## 2023-04-29 DIAGNOSIS — B954 Other streptococcus as the cause of diseases classified elsewhere: Secondary | ICD-10-CM | POA: Diagnosis not present

## 2023-04-29 NOTE — Progress Notes (Signed)
DENE, SANDERFER A ([id number]) [id number] Page 1 of 8 Visit Report for 04/20/2023 Arrival Information Details Patient Name: Date of Service: [name], CO [place] W A. 04/20/2023 8:00 A M Medical Record Number: [id number] Patient Account Number: [id number] Date of Birth/Sex: Treating RN: [birth year] (52 y.o. [name]) [name] Primary Care [name]: [name] Other Clinician: Referring [name]: Treating [name]: [name] in Treatment: 9 Visit Information History Since Last Visit Added or deleted any medications: No Patient Arrived: Ambulatory Any new allergies or adverse reactions: No Arrival Time: 08:12 Had a fall or experienced change in No Accompanied By: self activities of daily living that may affect Transfer Assistance: None risk of falls: Patient Identification Verified: Yes Signs or symptoms of abuse/neglect since last visito No Secondary Verification Process Completed: Yes Hospitalized since last visit: No Patient Requires Transmission-Based Precautions: No Implantable device outside of the clinic excluding No Patient Has Alerts: Yes cellular tissue based products placed in the center Patient Alerts: Patient on Blood Thinner since last visit: ABI R Chicken TBI .61 10/11/22 Has Dressing in Place as Prescribed: Yes ABI L [place] TBI .33 10/11/22 Has Compression in Place as Prescribed: Yes Pain Present Now: No Electronic Signature(s) Signed: 04/29/2023 11:59:03 AM By: [name] Pax RN Entered By: [name] on 04/20/2023 05:12:46 -------------------------------------------------------------------------------- Clinic Level of Care Assessment Details Patient Name: Date of Service: [name], [name] 04/20/2023 8:00 A M Medical Record Number: [id number] Patient Account Number: [id number] Date of Birth/Sex: Treating RN: [birth year] (52 y.o. [name] Primary Care Osric Klopf: [name] Other Clinician: Referring  [name]: Treating [name]/Extender: [name] in Treatment: 9 Clinic Level of Care Assessment Items TOOL 1 Quantity Score []  - 0 Use when EandM and Procedure is performed on INITIAL visit ASSESSMENTS - Nursing Assessment / Reassessment []  - 0 General Physical Exam (combine w/ comprehensive assessment (listed just below) when performed on new pt. evals) []  - 0 Comprehensive Assessment (HX, ROS, Risk Assessments, Wounds Hx, etc.) [name], [name] ([id number]) [phone] Page 2 of 8 ASSESSMENTS - Wound and Skin Assessment / Reassessment []  - 0 Dermatologic / Skin Assessment (not related to wound area) ASSESSMENTS - Ostomy and/or Continence Assessment and Care []  - 0 Incontinence Assessment and Management []  - 0 Ostomy Care Assessment and Management (repouching, etc.) PROCESS - Coordination of Care []  - 0 Simple Patient / Family Education for ongoing care []  - 0 Complex (extensive) Patient / Family Education for ongoing care []  - 0 Staff obtains Chiropractor, Records, T Results / Process Orders est []  - 0 Staff telephones HHA, Nursing Homes / Clarify orders / etc []  - 0 Routine Transfer to another Facility (non-emergent condition) []  - 0 Routine Hospital Admission (non-emergent condition) []  - 0 New Admissions / Manufacturing engineer / Ordering NPWT Apligraf, etc. , []  - 0 Emergency Hospital Admission (emergent condition) PROCESS - Special Needs []  - 0 Pediatric / Minor Patient Management []  - 0 Isolation Patient Management []  - 0 Hearing / Language / Visual special needs []  - 0 Assessment of Community assistance (transportation, D/C planning, etc.) []  - 0 Additional assistance / Altered mentation []  - 0 Support Surface(s) Assessment (bed, cushion, seat, etc.) INTERVENTIONS - Miscellaneous []  - 0 External ear exam []  - 0 Patient Transfer (multiple staff / Nurse, adult / Similar devices) []  - 0 Simple Staple /  Suture removal (25 or less) []  - 0 Complex Staple / Suture removal (26 or more) []  - 0 Hypo/Hyperglycemic Management (do not  check if billed separately) []  - 0 Ankle / Brachial Index (ABI) - do not check if billed separately Has the patient been seen at the hospital within the last three years: Yes Total Score: 0 Level Of Care: ____ Electronic Signature(s) Signed: 04/29/2023 11:59:03 AM By: [name] Pax RN Entered By: [name] on 04/20/2023 05:48:49 -------------------------------------------------------------------------------- Encounter Discharge Information Details Patient Name: Date of Service: [name], [name] 04/20/2023 8:00 A M Medical Record Number: [id number] Patient Account Number: [id number] Date of Birth/Sex: Treating RN: 1971-03-20 (52 y.o. [name] Primary Care Reilyn Nelson: [name] Other Clinician: Referring [name]: Treating [name]/Extender: [name], Kamholz A ([id number]) [id number] Page 3 of 8 Weeks in Treatment: 9 Encounter Discharge Information Items Post Procedure Vitals Discharge Condition: Stable Temperature (F): 98.1 Ambulatory Status: Ambulatory Pulse (bpm): 50 Discharge Destination: Home Respiratory Rate (breaths/min): 18 Transportation: Private Auto Blood Pressure (mmHg): 139/64 Accompanied By: self Schedule Follow-up Appointment: Yes Clinical Summary of Care: Electronic Signature(s) Signed: 04/29/2023 11:59:03 AM By: [name] Pax RN Entered By: [name] on 04/20/2023 05:49:40 -------------------------------------------------------------------------------- Lower Extremity Assessment Details Patient Name: Date of Service: [name], [name] 04/20/2023 8:00 A M Medical Record Number: [id number] Patient Account Number: [id number] Date of Birth/Sex: Treating RN: [birth year] (52 y.o. [name] Primary Care Dixie Jafri: [name] Other Clinician: Referring  Rease Wence: Treating Ashawn Rinehart/Extender: [name] in Treatment: 9 Edema Assessment Assessed: Kyra Searles: No] Franne Forts: No] [Left: Edema] [Right: :] Calf Left: Right: Point of Measurement: 40 cm From Medial Instep 53 cm Ankle Left: Right: Point of Measurement: 10 cm From Medial Instep 43 cm Vascular Assessment Pulses: Dorsalis Pedis Palpable: [Left:Yes] Extremity colors, hair growth, and conditions: Extremity Color: [Left:Hyperpigmented] Hair Growth on Extremity: [Left:No] Temperature of Extremity: [Left:Warm] Capillary Refill: [Left:< 3 seconds] Dependent Rubor: [Left:No] Blanched when Elevated: [Left:No Yes] Toe Nail Assessment Left: Right: Thick: Yes Discolored: Yes Deformed: Yes Improper Length and Hygiene: Yes Arbaugh, Chino A ([id number]) [id number] Page 4 of 8 Electronic Signature(s) Signed: 04/29/2023 11:59:03 AM By: [name] Pax RN Entered By: [name] on 04/20/2023 05:21:34 -------------------------------------------------------------------------------- Multi Wound Chart Details Patient Name: Date of Service: [name], [name] 04/20/2023 8:00 A M Medical Record Number: [id number] Patient Account Number: [id number] Date of Birth/Sex: Treating RN: 11-Apr-1971 (52 y.o. [name] Primary Care Ysabel Stankovich: [name] Other Clinician: Referring Edsel Shives: Treating Okema Rollinson/Extender: [name] in Treatment: 9 Vital Signs Height(in): 76 Pulse(bpm): 50 Weight(lbs): 400 Blood Pressure(mmHg): 139/64 Body Mass Index(BMI): 48.7 Temperature(F): 98.1 Respiratory Rate(breaths/min): 18 [22:Photos:] [N/A:N/A] Left Lower Leg N/A N/A Wound Location: Gradually Appeared N/A N/A Wounding Event: Diabetic Wound/Ulcer of the Lower N/A N/A Primary Etiology: Extremity Hypertension, Type II Diabetes, N/A N/A Comorbid History: Neuropathy 08/30/2022 N/A N/A Date Acquired: 9 N/A N/A Weeks of  Treatment: Open N/A N/A Wound Status: No N/A N/A Wound Recurrence: 4.5x4x0.1 N/A N/A Measurements L x W x D (cm) 14.137 N/A N/A A (cm) : rea 1.414 N/A N/A Volume (cm) : 84.40% N/A N/A % Reduction in A rea: 97.40% N/A N/A % Reduction in Volume: Grade 2 N/A N/A Classification: Medium N/A N/A Exudate A mount: Serosanguineous N/A N/A Exudate Type: red, brown N/A N/A Exudate Color: Large (67-100%) N/A N/A Granulation A mount: Red N/A N/A Granulation Quality: Small (1-33%) N/A N/A Necrotic A mount: Fat Layer (Subcutaneous Tissue): Yes N/A N/A Exposed Structures: Fascia: No Tendon: No Muscle: No Joint: No Bone: No None N/A N/A Epithelialization: Treatment Notes Millikan,  Callan A ([id number]) [id number] Page 5 of 8 Electronic Signature(s) Signed: 04/29/2023 11:59:03 AM By: [name] Pax RN Entered By: [name] on 04/20/2023 05:21:44 -------------------------------------------------------------------------------- Multi-Disciplinary Care Plan Details Patient Name: Date of Service: [name], [name] 04/20/2023 8:00 A M Medical Record Number: [id number] Patient Account Number: [id number] Date of Birth/Sex: Treating RN: May 14, 1971 (52 y.o. [name] Primary Care Kushal Saunders: [name] Other Clinician: Referring Arshan Jabs: Treating Jaleeyah Munce/Extender: [name] in Treatment: 9 Active Inactive Wound/Skin Impairment Nursing Diagnoses: Knowledge deficit related to ulceration/compromised skin integrity Goals: Patient/caregiver will verbalize understanding of skin care regimen Date Initiated: 03/09/2023 Date Inactivated: 04/13/2023 Target Resolution Date: 04/09/2023 Goal Status: Met Ulcer/skin breakdown will have a volume reduction of 30% by week 4 Date Initiated: 03/09/2023 Date Inactivated: 04/13/2023 Target Resolution Date: 04/09/2023 Goal Status: Met Ulcer/skin breakdown will have a volume reduction of 50%  by week 8 Date Initiated: 03/09/2023 Target Resolution Date: 05/10/2023 Goal Status: Active Ulcer/skin breakdown will have a volume reduction of 80% by week 12 Date Initiated: 03/09/2023 Target Resolution Date: 06/09/2023 Goal Status: Active Ulcer/skin breakdown will heal within 14 weeks Date Initiated: 03/09/2023 Target Resolution Date: 07/10/2023 Goal Status: Active Interventions: Assess patient/caregiver ability to obtain necessary supplies Assess patient/caregiver ability to perform ulcer/skin care regimen upon admission and as needed Assess ulceration(s) every visit Notes: Electronic Signature(s) Signed: 04/29/2023 11:59:03 AM By: [name] Pax RN Entered By: [name] on 04/20/2023 05:22:42 Route, Vikram A ([id number]) [id number] Page 6 of 8 -------------------------------------------------------------------------------- Pain Assessment Details Patient Name: Date of Service: [name], [name] 04/20/2023 8:00 A M Medical Record Number: [id number] Patient Account Number: [id number] Date of Birth/Sex: Treating RN: 1970-12-28 (52 y.o. [name]) [name] Primary Care Seleste Tallman: [name] Other Clinician: Referring Biagio Snelson: Treating Jakorey Mcconathy/Extender: [name] in Treatment: 9 Active Problems Location of Pain Severity and Description of Pain Patient Has Paino No Site Locations Pain Management and Medication Current Pain Management: Electronic Signature(s) Signed: 04/29/2023 11:59:03 AM By: [name] Pax RN Entered By: [name] on 04/20/2023 05:19:19 -------------------------------------------------------------------------------- Patient/Caregiver Education Details Patient Name: Date of Service: [name], [name] 8/21/2024andnbsp8:00 A M Medical Record Number: [id number] Patient Account Number: [id number] Date of Birth/Gender: Treating RN: 05-06-1971 (52 y.o. [name] Primary Care Physician: [name]  Other Clinician: Referring Physician: Treating Physician/Extender: [name] in Treatment: [address] [place] A ([id number]) [id number] Page 7 of 8 Education Assessment Education Provided To: Patient Education Topics Provided Wound/Skin Impairment: Handouts: Caring for Your Ulcer Methods: Explain/Verbal Responses: State content correctly Electronic Signature(s) Signed: 04/29/2023 11:59:03 AM By: [name] Pax RN Entered By: [name] on 04/20/2023 05:22:58 -------------------------------------------------------------------------------- Wound Assessment Details Patient Name: Date of Service: [name], [name] 04/20/2023 8:00 A M Medical Record Number: [id number] Patient Account Number: [id number] Date of Birth/Sex: Treating RN: 10/07/1970 (52 y.o. [name]) [name] Primary Care Nikka Hakimian: [name] Other Clinician: Referring Carra Brindley: Treating Fiorela Pelzer/Extender: [name] in Treatment: 9 Wound Status Wound Number: [id number] Primary Etiology: Diabetic Wound/Ulcer of the Lower Extremity Wound Location: Left Lower Leg Wound Status: Open Wounding Event: Gradually Appeared Comorbid History: Hypertension, Type II Diabetes, Neuropathy Date Acquired: 08/30/2022 Weeks Of Treatment: 9 Clustered Wound: No Photos Wound Measurements Length: (cm) 4.5 Width: (cm) 4 Depth: (cm) 0.1 Area: (cm) 14.137 Volume: (cm) 1.414 % Reduction in Area: 84.4% % Reduction in Volume: 97.4% Epithelialization: None Tunneling: No Undermining: No Wound Description Classification: Grade 2 Exudate Amount: Medium  Exudate Type: Serosanguineous Scouten, Kellen A ([id number]) Exudate Color: red, brown Foul Odor After Cleansing: No Slough/Fibrino Yes [phone].pdf Page 8 of 8 Wound Bed Granulation Amount: Large (67-100%) Exposed Structure Granulation Quality: Red Fascia Exposed: No Necrotic Amount:  Small (1-33%) Fat Layer (Subcutaneous Tissue) Exposed: Yes Necrotic Quality: Adherent Slough Tendon Exposed: No Muscle Exposed: No Joint Exposed: No Bone Exposed: No Electronic Signature(s) Signed: 04/29/2023 11:59:03 AM By: [name] Pax RN Entered By: [name] on 04/20/2023 05:20:34 -------------------------------------------------------------------------------- Vitals Details Patient Name: Date of Service: [name], [name] 04/20/2023 8:00 A M Medical Record Number: [id number] Patient Account Number: [id number] Date of Birth/Sex: Treating RN: Sep 14, 1970 (52 y.o. [name]) [name] Primary Care Vinisha Faxon: [name] Other Clinician: Referring Jannis Atkins: Treating Lameka Disla/Extender: [name] in Treatment: 9 Vital Signs Time Taken: 08:12 Temperature (F): 98.1 Height (in): 76 Pulse (bpm): 50 Weight (lbs): 400 Respiratory Rate (breaths/min): 18 Body Mass Index (BMI): 48.7 Blood Pressure (mmHg): 139/64 Reference Range: 80 - 120 mg / dl Electronic Signature(s) Signed: 04/29/2023 11:59:03 AM By: [name] Pax RN Entered By: [name] on 04/20/2023 05:13:09

## 2023-04-29 NOTE — Progress Notes (Signed)
[name], [name] ([id number]) [id number] Page 1 of 11 Visit Report for 04/27/2023 Arrival Information Details Patient Name: Date of Service: [name] 04/27/2023 10:15 [name] Medical Record Number: [id number] Patient Account Number: [id number] Date of Birth/Sex: Treating RN: 1971-02-08 (52 y.o. [name]) [name] Primary Care Esperanza Madrazo: [name] Other Clinician: Referring Krish Bailly: Treating Litha Lamartina/Extender: Geralynn Ochs in Treatment: 10 Visit Information History Since Last Visit Added or deleted any medications: No Patient Arrived: Ambulatory Any new allergies or adverse reactions: No Arrival Time: 10:21 Had [name] fall or experienced change in No Accompanied By: self activities of daily living that may affect Transfer Assistance: None risk of falls: Patient Identification Verified: Yes Signs or symptoms of abuse/neglect since last visito No Secondary Verification Process Completed: Yes Hospitalized since last visit: No Patient Requires Transmission-Based Precautions: No Implantable device outside of the clinic excluding No Patient Has Alerts: Yes cellular tissue based products placed in the center Patient Alerts: Patient on Blood Thinner since last visit: ABI R [place] TBI .61 10/11/22 Has Dressing in Place as Prescribed: Yes ABI L  TBI .33 10/11/22 Pain Present Now: No Electronic Signature(s) Signed: 04/29/2023 11:58:03 AM By: [name] RN Entered By: [name] on 04/27/2023 07:22:43 -------------------------------------------------------------------------------- Clinic Level of Care Assessment Details Patient Name: Date of Service: [name] 04/27/2023 10:15 [name] Medical Record Number: [id number] Patient Account Number: [id number] Date of Birth/Sex: Treating RN: 07/25/1971 (52 y.o. [name] Primary Care Keyandra Swenson: [name] Other Clinician: Referring Laiylah Roettger: Treating Rossy Virag/Extender: Geralynn Ochs in Treatment: 10 Clinic Level of Care Assessment Items TOOL 1 Quantity Score []  - 0 Use when EandM and Procedure is performed on INITIAL visit ASSESSMENTS - Nursing Assessment / Reassessment []  - 0 General Physical Exam (combine w/ comprehensive assessment (listed just below) when performed on new pt. evals) []  - 0 Comprehensive Assessment (HX, ROS, Risk Assessments, Wounds Hx, etc.) [name] ([id number]) [id number].pdf Page 2 of 11 ASSESSMENTS - Wound and Skin Assessment / Reassessment []  - 0 Dermatologic / Skin Assessment (not related to wound area) ASSESSMENTS - Ostomy and/or Continence Assessment and Care []  - 0 Incontinence Assessment and Management []  - 0 Ostomy Care Assessment and Management (repouching, etc.) PROCESS - Coordination of Care []  - 0 Simple Patient / Family Education for ongoing care []  - 0 Complex (extensive) Patient / Family Education for ongoing care []  - 0 Staff obtains Chiropractor, Records, T Results / Process Orders est []  - 0 Staff telephones HHA, Nursing Homes / Clarify orders / etc []  - 0 Routine Transfer to another Facility (non-emergent condition) []  - 0 Routine Hospital Admission (non-emergent condition) []  - 0 New Admissions / Manufacturing engineer / Ordering NPWT Apligraf, etc. , []  - 0 Emergency Hospital Admission (emergent condition) PROCESS - Special Needs []  - 0 Pediatric / Minor Patient Management []  - 0 Isolation Patient Management []  - 0 Hearing / Language / Visual special needs []  - 0 Assessment of Community assistance (transportation, D/C planning, etc.) []  - 0 Additional assistance / Altered mentation []  - 0 Support Surface(s) Assessment (bed, cushion, seat, etc.) INTERVENTIONS - Miscellaneous []  - 0 External ear exam []  - 0 Patient Transfer (multiple staff / Nurse, adult / Similar devices) []  - 0 Simple Staple / Suture removal (25 or less) []  - 0 Complex  Staple / Suture removal (26 or more) []  - 0 Hypo/Hyperglycemic Management (do not check if billed separately) []  - 0  Ankle / Brachial Index (ABI) - do not check if billed separately Has the patient been seen at the hospital within the last three years: Yes Total Score: 0 Level Of Care: ____ Electronic Signature(s) Signed: 04/29/2023 11:58:03 AM By: [name] RN Entered By: [name] on 04/27/2023 09:42:50 -------------------------------------------------------------------------------- Compression Therapy Details Patient Name: Date of Service: [name] 04/27/2023 10:15 [name] Medical Record Number: [id number] Patient Account Number: [id number] Date of Birth/Sex: Treating RN: 05/09/1971 (52 y.o. [name] Primary Care Wenceslao Loper: [name] Other Clinician: Referring Oona Trammel: Treating Akeel Reffner/Extender: Geralynn Ochs in Treatment: 10 [name], [name] ([id number]) [id number] Page 3 of 11 Compression Therapy Performed for Wound Assessment: Wound #22 Left Lower Leg Performed By: Clinician [name], RN Compression Type: Three Layer Post Procedure Diagnosis Same as Pre-procedure Electronic Signature(s) Signed: 04/27/2023 12:42:23 PM By: [name] RN Entered By: [name] on 04/27/2023 09:42:23 -------------------------------------------------------------------------------- Compression Therapy Details Patient Name: Date of Service: [name] 04/27/2023 10:15 [name] Medical Record Number: [id number] Patient Account Number: [id number] Date of Birth/Sex: Treating RN: December 09, 1970 (52 y.o. [name] Primary Care Barrett Goldie: [name] Other Clinician: Referring Delayne Sanzo: Treating Laiken Sandy/Extender: Geralynn Ochs in Treatment: 10 Compression Therapy Performed for Wound Assessment: Wound #23 Left,Lateral Lower Leg Performed By: Clinician [name], RN Compression  Type: Three Layer Post Procedure Diagnosis Same as Pre-procedure Electronic Signature(s) Signed: 04/27/2023 12:42:37 PM By: [name] RN Entered By: [name] on 04/27/2023 09:42:37 -------------------------------------------------------------------------------- Encounter Discharge Information Details Patient Name: Date of Service: [name] 04/27/2023 10:15 [name] Medical Record Number: [id number] Patient Account Number: [id number] Date of Birth/Sex: Treating RN: July 04, 1971 (52 y.o. [name] Primary Care Osborne Serio: [name] Other Clinician: Referring Amitai Delaughter: Treating Anelisse Jacobson/Extender: Geralynn Ochs in Treatment: 10 Encounter Discharge Information Items Post Procedure Vitals Discharge Condition: Stable Temperature (F): 98.3 Ambulatory Status: Ambulatory Pulse (bpm): 71 Discharge Destination: Home Respiratory Rate (breaths/min): 18 Transportation: Private Auto Blood Pressure (mmHg): 166/90 Accompanied By: self Vernelle Emerald [name] ([id number]) [id number] Page 4 of 11 Schedule Follow-up Appointment: Yes Clinical Summary of Care: Electronic Signature(s) Signed: 04/27/2023 12:43:51 PM By: [name] RN Entered By: [name] on 04/27/2023 09:43:51 -------------------------------------------------------------------------------- Lower Extremity Assessment Details Patient Name: Date of Service: [name] 04/27/2023 10:15 [name] Medical Record Number: [id number] Patient Account Number: [id number] Date of Birth/Sex: Treating RN: 28-Aug-1971 (52 y.o. [name] Primary Care Azarie Coriz: [name] Other Clinician: Referring Robert Sunga: Treating Leviticus Harton/Extender: Geralynn Ochs in Treatment: 10 Edema Assessment Assessed: Kyra Searles: No] Franne Forts: No] Edema: [Left: Ye] [Right: s] Calf Left: Right: Point of Measurement: 40 cm From Medial Instep 48 cm Ankle Left: Right: Point  of Measurement: 10 cm From Medial Instep 43 cm Vascular Assessment Pulses: Dorsalis Pedis Palpable: [Left:Yes] Extremity colors, hair growth, and conditions: Extremity Color: [Left:Hyperpigmented] Hair Growth on Extremity: [Left:No] Temperature of Extremity: [Left:Warm] Capillary Refill: [Left:< 3 seconds] Dependent Rubor: [Left:No] Blanched when Elevated: [Left:No Yes] Toe Nail Assessment Left: Right: Thick: Yes Discolored: Yes Deformed: Yes Improper Length and Hygiene: Yes Electronic Signature(s) Signed: 04/29/2023 11:58:03 AM By: [name] RN Entered By: [name] on 04/27/2023 07:33:48 [name], [name] ([id number]) [id number] Page 5 of 11 -------------------------------------------------------------------------------- Multi Wound Chart Details Patient Name: Date of Service: [name] 04/27/2023 10:15 [name] Medical Record Number: [id number] Patient Account Number: [id number] Date of Birth/Sex: Treating RN:  [birth year] (52 y.o. [name] Primary Care Shene Maxfield: [name] Other Clinician: Referring Raven Harmes: Treating Virgie Chery/Extender: Geralynn Ochs in Treatment: 10 Vital Signs Height(in): 76 Pulse(bpm): 71 Weight(lbs): 400 Blood Pressure(mmHg): 166/90 Body Mass Index(BMI): 48.7 Temperature(F): 98.3 Respiratory Rate(breaths/min): 18 [22:Photos:] [N/[name]:N/[name]] Left Lower Leg N/[name] N/[name] Wound Location: Gradually Appeared N/[name] N/[name] Wounding Event: Diabetic Wound/Ulcer of the Lower N/[name] N/[name] Primary Etiology: Extremity Hypertension, Type II Diabetes, N/[name] N/[name] Comorbid History: Neuropathy 08/30/2022 N/[name] N/[name] Date Acquired: 10 N/[name] N/[name] Weeks of Treatment: Open N/[name] N/[name] Wound Status: No N/[name] N/[name] Wound Recurrence: 4x3.5x0.1 N/[name] N/[name] Measurements L x W x D (cm) 10.996 N/[name] N/[name] (cm) : rea 1.1 N/[name] N/[name] Volume (cm) : 87.90% N/[name] N/[name] % Reduction in [name] rea: 98.00% N/[name] N/[name] % Reduction in Volume: Grade 2 N/[name]  N/[name] Classification: Medium N/[name] N/[name] Exudate [name] mount: Serosanguineous N/[name] N/[name] Exudate Type: red, brown N/[name] N/[name] Exudate Color: Large (67-100%) N/[name] N/[name] Granulation [name] mount: Red N/[name] N/[name] Granulation Quality: Small (1-33%) N/[name] N/[name] Necrotic [name] mount: Fat Layer (Subcutaneous Tissue): Yes N/[name] N/[name] Exposed Structures: Fascia: No Tendon: No Muscle: No Joint: No Bone: No None N/[name] N/[name] Epithelialization: Treatment Notes Electronic Signature(s) Signed: 04/29/2023 11:58:03 AM By: [name] RN Entered By: [name] on 04/27/2023 07:33:56 [name], [name] ([id number]) [id number] Page 6 of 11 -------------------------------------------------------------------------------- Multi-Disciplinary Care Plan Details Patient Name: Date of Service: [name] 04/27/2023 10:15 [name] Medical Record Number: [id number] Patient Account Number: [id number] Date of Birth/Sex: Treating RN: 05-22-1971 (52 y.o. [name]) [name] Primary Care Mairi Stagliano: [name] Other Clinician: Referring Jeovany Huitron: Treating Minor Iden/Extender: Geralynn Ochs in Treatment: 10 Active Inactive Wound/Skin Impairment Nursing Diagnoses: Knowledge deficit related to ulceration/compromised skin integrity Goals: Patient/caregiver will verbalize understanding of skin care regimen Date Initiated: 03/09/2023 Date Inactivated: 04/13/2023 Target Resolution Date: 04/09/2023 Goal Status: Met Ulcer/skin breakdown will have [name] volume reduction of 30% by week 4 Date Initiated: 03/09/2023 Date Inactivated: 04/13/2023 Target Resolution Date: 04/09/2023 Goal Status: Met Ulcer/skin breakdown will have [name] volume reduction of 50% by week 8 Date Initiated: 03/09/2023 Target Resolution Date: 05/10/2023 Goal Status: Active Ulcer/skin breakdown will have [name] volume reduction of 80% by week 12 Date Initiated: 03/09/2023 Target Resolution Date: 06/09/2023 Goal Status: Active Ulcer/skin breakdown  will heal within 14 weeks Date Initiated: 03/09/2023 Target Resolution Date: 07/10/2023 Goal Status: Active Interventions: Assess patient/caregiver ability to obtain necessary supplies Assess patient/caregiver ability to perform ulcer/skin care regimen upon admission and as needed Assess ulceration(s) every visit Notes: Electronic Signature(s) Signed: 04/29/2023 11:58:03 AM By: [name] RN Entered By: [name] on 04/27/2023 07:34:50 -------------------------------------------------------------------------------- Pain Assessment Details Patient Name: Date of Service: [name] 04/27/2023 10:15 [name] Medical Record Number: [id number] Patient Account Number: [id number] [name], [name] ([id number]) [id number] Page 7 of 11 Date of Birth/Sex: Treating RN: [birth year] (52 y.o. [name]) [name] Primary Care Ashlie Mcmenamy: Other Clinician: [name] Referring Norinne Jeane: Treating Hiroyuki Ozanich/Extender: Geralynn Ochs in Treatment: 10 Active Problems Location of Pain Severity and Description of Pain Patient Has Paino No Site Locations Pain Management and Medication Current Pain Management: Electronic Signature(s) Signed: 04/29/2023 11:58:03 AM By: [name] RN Entered By: [name] on 04/27/2023 07:23:27 -------------------------------------------------------------------------------- Patient/Caregiver Education Details Patient Name: Date of Service: [name] 8/28/2024andnbsp10:15 [name] Medical Record Number: [id number] Patient Account Number: [id number] Date of Birth/Gender: Treating RN: 17-Nov-1970 (52 y.o. [name] Primary Care Physician:  [name] Other Clinician: Referring Physician: Treating Physician/Extender: Geralynn Ochs in Treatment: 10 Education Assessment Education Provided To: Patient Education Topics Provided Wound/Skin Impairment: Handouts: Caring for Your  Ulcer Methods: Explain/Verbal Responses: State content correctly Electronic Signature(s) [name], [name] ([id number]) [id number].pdf Page 8 of 11 Signed: 04/29/2023 11:58:03 AM By: [name] RN Entered By: [name] on 04/27/2023 07:35:11 -------------------------------------------------------------------------------- Wound Assessment Details Patient Name: Date of Service: [name] 04/27/2023 10:15 [name] Medical Record Number: [id number] Patient Account Number: [id number] Date of Birth/Sex: Treating RN: [birth year] (52 y.o. [name]) [name] Primary Care [name]: [name] Other Clinician: Referring Fatimata Talsma: Treating Jashiya Bassett/Extender: Geralynn Ochs in Treatment: 10 Wound Status Wound Number: 22 Primary Etiology: Diabetic Wound/Ulcer of the Lower Extremity Wound Location: Left Lower Leg Wound Status: Open Wounding Event: Gradually Appeared Comorbid History: Hypertension, Type II Diabetes, Neuropathy Date Acquired: 08/30/2022 Weeks Of Treatment: 10 Clustered Wound: No Photos Wound Measurements Length: (cm) 4 Width: (cm) 3.5 Depth: (cm) 0.1 Area: (cm) 10.996 Volume: (cm) 1.1 % Reduction in Area: 87.9% % Reduction in Volume: 98% Epithelialization: None Tunneling: No Undermining: No Wound Description Classification: Grade 2 Exudate Amount: Medium Exudate Type: Serosanguineous Exudate Color: red, brown Foul Odor After Cleansing: No Slough/Fibrino Yes Wound Bed Granulation Amount: Large (67-100%) Exposed Structure Granulation Quality: Red Fascia Exposed: No Necrotic Amount: Small (1-33%) Fat Layer (Subcutaneous Tissue) Exposed: Yes Necrotic Quality: Adherent Slough Tendon Exposed: No Muscle Exposed: No Joint Exposed: No Bone Exposed: No Treatment Notes Wound #22 (Lower Leg) Wound Laterality: Left [name], [name] ([id number]) [id number] Page 9 of 11 Cleanser Soap and  Water Discharge Instruction: Gently cleanse wound with antibacterial soap, rinse and pat dry prior to dressing wounds Wound Cleanser Discharge Instruction: Wash your hands with soap and water. Remove old dressing, discard into plastic bag and place into trash. Cleanse the wound with Wound Cleanser prior to applying [name] clean dressing using gauze sponges, not tissues or cotton balls. Do not scrub or use excessive force. Pat dry using gauze sponges, not tissue or cotton balls. Peri-Wound Care Topical Primary Dressing Silvercel 4 1/4x 4 1/4 (in/in) Discharge Instruction: Apply Silvercel 4 1/4x 4 1/4 (in/in) as instructed Secondary Dressing Zetuvit Plus 4x4 (in/in) Secured With [employer] Dressing, Latex-free, Size 5, Small-Head / Shoulder / Thigh Discharge Instruction: over wrap for comfort Compression Wrap 3-LAYER WRAP - Profore Lite LF 3 Multilayer Compression Bandaging System Discharge Instruction: Apply 3 multi-layer wrap as prescribed. Compression Stockings Add-Ons Electronic Signature(s) Signed: 04/29/2023 11:58:03 AM By: [name] RN Entered By: [name] on 04/27/2023 07:31:44 -------------------------------------------------------------------------------- Wound Assessment Details Patient Name: Date of Service: [name] 04/27/2023 10:15 [name] Medical Record Number: [id number] Patient Account Number: [id number] Date of Birth/Sex: Treating RN: 1970-12-16 (52 y.o. [name] Primary Care Kamari Buch: [name] Other Clinician: Referring Zoeann Mol: Treating Angelli Baruch/Extender: Geralynn Ochs in Treatment: 10 Wound Status Wound Number: 23 Primary Etiology: Diabetic Wound/Ulcer of the Lower Extremity Wound Location: Left, Lateral Lower Leg Wound Status: Open Wounding Event: Blister Comorbid History: Hypertension, Type II Diabetes, Neuropathy Date Acquired: 04/27/2023 Weeks Of Treatment: 0 Clustered Wound: No Photos [name], [name]  ([id number]) [id number] Page 10 of 11 Wound Measurements Length: (cm) 4 Width: (cm) 4 Depth: (cm) 0.1 Area: (cm) 12.566 Volume: (cm) 1.257 % Reduction in Area: % Reduction in Volume: Epithelialization: None Tunneling: No Undermining: No Wound Description Classification: Grade 1 Exudate Amount: Medium Exudate Type: Serosanguineous Exudate Color:  red, brown Foul Odor After Cleansing: No Slough/Fibrino No Wound Bed Granulation Amount: Large (67-100%) Exposed Structure Granulation Quality: Red Fascia Exposed: No Necrotic Amount: None Present (0%) Fat Layer (Subcutaneous Tissue) Exposed: Yes Tendon Exposed: No Muscle Exposed: No Joint Exposed: No Bone Exposed: No Treatment Notes Wound #23 (Lower Leg) Wound Laterality: Left, Lateral Cleanser Soap and Water Discharge Instruction: Gently cleanse wound with antibacterial soap, rinse and pat dry prior to dressing wounds Wound Cleanser Discharge Instruction: Wash your hands with soap and water. Remove old dressing, discard into plastic bag and place into trash. Cleanse the wound with Wound Cleanser prior to applying [name] clean dressing using gauze sponges, not tissues or cotton balls. Do not scrub or use excessive force. Pat dry using gauze sponges, not tissue or cotton balls. Peri-Wound Care Topical Primary Dressing Silvercel 4 1/4x 4 1/4 (in/in) Discharge Instruction: Apply Silvercel 4 1/4x 4 1/4 (in/in) as instructed Secondary Dressing Zetuvit Plus 4x4 (in/in) Secured With [employer] Dressing, Latex-free, Size 5, Small-Head / Shoulder / Thigh Discharge Instruction: over wrap for comfort Compression Wrap 3-LAYER WRAP - Profore Lite LF 3 Multilayer Compression Bandaging System Discharge Instruction: Apply 3 multi-layer wrap as prescribed. Compression Stockings Add-Ons Electronic Signature(s) Signed: 04/29/2023 11:58:03 AM By: [name] RN Entered By: [name] on 04/27/2023 07:45:45 [name],  [name] ([id number]) [id number] Page 11 of 11 -------------------------------------------------------------------------------- Vitals Details Patient Name: Date of Service: [name] 04/27/2023 10:15 [name] Medical Record Number: [id number] Patient Account Number: [id number] Date of Birth/Sex: Treating RN: [birth year] (52 y.o. [name]) [name] Primary Care Kaniesha Barile: [name] Other Clinician: Referring [place]ara Acklin: Treating Doshie Maggi/Extender: Geralynn Ochs in Treatment: 10 Vital Signs Time Taken: 10:22 Temperature (F): 98.3 Height (in): 76 Pulse (bpm): 71 Weight (lbs): 400 Respiratory Rate (breaths/min): 18 Body Mass Index (BMI): 48.7 Blood Pressure (mmHg): 166/90 Reference Range: 80 - 120 mg / dl Electronic Signature(s) Signed: 04/29/2023 11:58:03 AM By: [name] RN Entered By: [name] on 04/27/2023 07:23:03

## 2023-04-30 DIAGNOSIS — R7881 Bacteremia: Secondary | ICD-10-CM | POA: Diagnosis not present

## 2023-04-30 DIAGNOSIS — L03115 Cellulitis of right lower limb: Secondary | ICD-10-CM | POA: Diagnosis not present

## 2023-04-30 DIAGNOSIS — Z992 Dependence on renal dialysis: Secondary | ICD-10-CM | POA: Diagnosis not present

## 2023-04-30 DIAGNOSIS — L97929 Non-pressure chronic ulcer of unspecified part of left lower leg with unspecified severity: Secondary | ICD-10-CM | POA: Diagnosis not present

## 2023-04-30 DIAGNOSIS — N186 End stage renal disease: Secondary | ICD-10-CM | POA: Diagnosis not present

## 2023-04-30 DIAGNOSIS — N2581 Secondary hyperparathyroidism of renal origin: Secondary | ICD-10-CM | POA: Diagnosis not present

## 2023-04-30 DIAGNOSIS — L03116 Cellulitis of left lower limb: Secondary | ICD-10-CM | POA: Diagnosis not present

## 2023-05-02 DIAGNOSIS — E1022 Type 1 diabetes mellitus with diabetic chronic kidney disease: Secondary | ICD-10-CM | POA: Diagnosis not present

## 2023-05-02 DIAGNOSIS — E1051 Type 1 diabetes mellitus with diabetic peripheral angiopathy without gangrene: Secondary | ICD-10-CM | POA: Diagnosis not present

## 2023-05-02 DIAGNOSIS — I872 Venous insufficiency (chronic) (peripheral): Secondary | ICD-10-CM | POA: Diagnosis not present

## 2023-05-02 DIAGNOSIS — R7881 Bacteremia: Secondary | ICD-10-CM | POA: Diagnosis not present

## 2023-05-02 DIAGNOSIS — L97929 Non-pressure chronic ulcer of unspecified part of left lower leg with unspecified severity: Secondary | ICD-10-CM | POA: Diagnosis not present

## 2023-05-02 DIAGNOSIS — B954 Other streptococcus as the cause of diseases classified elsewhere: Secondary | ICD-10-CM | POA: Diagnosis not present

## 2023-05-02 DIAGNOSIS — J449 Chronic obstructive pulmonary disease, unspecified: Secondary | ICD-10-CM | POA: Diagnosis not present

## 2023-05-02 DIAGNOSIS — I120 Hypertensive chronic kidney disease with stage 5 chronic kidney disease or end stage renal disease: Secondary | ICD-10-CM | POA: Diagnosis not present

## 2023-05-02 DIAGNOSIS — N186 End stage renal disease: Secondary | ICD-10-CM | POA: Diagnosis not present

## 2023-05-03 DIAGNOSIS — Z992 Dependence on renal dialysis: Secondary | ICD-10-CM | POA: Diagnosis not present

## 2023-05-03 DIAGNOSIS — N2581 Secondary hyperparathyroidism of renal origin: Secondary | ICD-10-CM | POA: Diagnosis not present

## 2023-05-03 DIAGNOSIS — N186 End stage renal disease: Secondary | ICD-10-CM | POA: Diagnosis not present

## 2023-05-03 DIAGNOSIS — R7881 Bacteremia: Secondary | ICD-10-CM | POA: Diagnosis not present

## 2023-05-03 DIAGNOSIS — L97929 Non-pressure chronic ulcer of unspecified part of left lower leg with unspecified severity: Secondary | ICD-10-CM | POA: Diagnosis not present

## 2023-05-04 ENCOUNTER — Encounter: Payer: Medicare HMO | Attending: Internal Medicine | Admitting: Internal Medicine

## 2023-05-04 DIAGNOSIS — E1022 Type 1 diabetes mellitus with diabetic chronic kidney disease: Secondary | ICD-10-CM | POA: Insufficient documentation

## 2023-05-04 DIAGNOSIS — Z794 Long term (current) use of insulin: Secondary | ICD-10-CM | POA: Diagnosis not present

## 2023-05-04 DIAGNOSIS — L97822 Non-pressure chronic ulcer of other part of left lower leg with fat layer exposed: Secondary | ICD-10-CM | POA: Insufficient documentation

## 2023-05-04 DIAGNOSIS — E11622 Type 2 diabetes mellitus with other skin ulcer: Secondary | ICD-10-CM | POA: Diagnosis not present

## 2023-05-04 DIAGNOSIS — E10622 Type 1 diabetes mellitus with other skin ulcer: Secondary | ICD-10-CM | POA: Insufficient documentation

## 2023-05-04 DIAGNOSIS — I120 Hypertensive chronic kidney disease with stage 5 chronic kidney disease or end stage renal disease: Secondary | ICD-10-CM | POA: Insufficient documentation

## 2023-05-04 DIAGNOSIS — L97522 Non-pressure chronic ulcer of other part of left foot with fat layer exposed: Secondary | ICD-10-CM | POA: Insufficient documentation

## 2023-05-04 DIAGNOSIS — N186 End stage renal disease: Secondary | ICD-10-CM | POA: Insufficient documentation

## 2023-05-04 DIAGNOSIS — I87312 Chronic venous hypertension (idiopathic) with ulcer of left lower extremity: Secondary | ICD-10-CM

## 2023-05-04 DIAGNOSIS — I890 Lymphedema, not elsewhere classified: Secondary | ICD-10-CM | POA: Diagnosis not present

## 2023-05-04 DIAGNOSIS — E1040 Type 1 diabetes mellitus with diabetic neuropathy, unspecified: Secondary | ICD-10-CM | POA: Insufficient documentation

## 2023-05-04 DIAGNOSIS — Z992 Dependence on renal dialysis: Secondary | ICD-10-CM | POA: Insufficient documentation

## 2023-05-04 DIAGNOSIS — Z792 Long term (current) use of antibiotics: Secondary | ICD-10-CM | POA: Diagnosis not present

## 2023-05-05 DIAGNOSIS — L97929 Non-pressure chronic ulcer of unspecified part of left lower leg with unspecified severity: Secondary | ICD-10-CM | POA: Diagnosis not present

## 2023-05-05 DIAGNOSIS — Z992 Dependence on renal dialysis: Secondary | ICD-10-CM | POA: Diagnosis not present

## 2023-05-05 DIAGNOSIS — R7881 Bacteremia: Secondary | ICD-10-CM | POA: Diagnosis not present

## 2023-05-05 DIAGNOSIS — N2581 Secondary hyperparathyroidism of renal origin: Secondary | ICD-10-CM | POA: Diagnosis not present

## 2023-05-05 DIAGNOSIS — N186 End stage renal disease: Secondary | ICD-10-CM | POA: Diagnosis not present

## 2023-05-05 NOTE — Progress Notes (Signed)
ZYQUEZ, SCARFONE [name] ([id number]) [id number] Page 1 of 10 Visit Report for 05/04/2023 Arrival Information Details Patient Name: Date of Service: [name], [name] 05/04/2023 10:15 [name] Medical Record Number: [id number] Patient Account Number: [id number] Date of Birth/Sex: Treating RN: [birth year] (52 y.o. [name]) [name] Primary Care Redonna Wilbert: [name] Other Clinician: Referring Zea Kostka: Treating Baneza Bartoszek/Extender: Geralynn Ochs in Treatment: 11 Visit Information History Since Last Visit Added or deleted any medications: No Patient Arrived: Ambulatory Any new allergies or adverse reactions: No Arrival Time: 10:22 Had [name] fall or experienced change in No Accompanied By: self activities of daily living that may affect Transfer Assistance: None risk of falls: Patient Identification Verified: Yes Signs or symptoms of abuse/neglect since last visito No Secondary Verification Process Completed: Yes Hospitalized since last visit: No Patient Requires Transmission-Based Precautions: No Implantable device outside of the clinic excluding No Patient Has Alerts: Yes cellular tissue based products placed in the center Patient Alerts: Patient on Blood Thinner since last visit: ABI R [place] TBI .61 10/11/22 Has Dressing in Place as Prescribed: Yes ABI L [place] TBI .33 10/11/22 Has Compression in Place as Prescribed: Yes Pain Present Now: No Electronic Signature(s) Signed: 05/05/2023 1:40:54 PM By: [name] RN Entered By: [name] on 05/04/2023 10:22:32 -------------------------------------------------------------------------------- Clinic Level of Care Assessment Details Patient Name: Date of Service: [name], [name] 05/04/2023 10:15 [name] Medical Record Number: [id number] Patient Account Number: [id number] Date of Birth/Sex: Treating RN: November 26, 1970 (52 y.o. [name] Primary Care Jaxyn Mestas: [name] Other Clinician: Referring  Christl Fessenden: Treating Ericson Nafziger/Extender: Geralynn Ochs in Treatment: 11 Clinic Level of Care Assessment Items TOOL 1 Quantity Score []  - 0 Use when EandM and Procedure is performed on INITIAL visit ASSESSMENTS - Nursing Assessment / Reassessment []  - 0 General Physical Exam (combine w/ comprehensive assessment (listed just below) when performed on new pt. evals) []  - 0 Comprehensive Assessment (HX, ROS, Risk Assessments, Wounds Hx, etc.) Lunn, [name] ([id number]) [id number] Page 2 of 10 ASSESSMENTS - Wound and Skin Assessment / Reassessment []  - 0 Dermatologic / Skin Assessment (not related to wound area) ASSESSMENTS - Ostomy and/or Continence Assessment and Care []  - 0 Incontinence Assessment and Management []  - 0 Ostomy Care Assessment and Management (repouching, etc.) PROCESS - Coordination of Care []  - 0 Simple Patient / Family Education for ongoing care []  - 0 Complex (extensive) Patient / Family Education for ongoing care []  - 0 Staff obtains Chiropractor, Records, T Results / Process Orders est []  - 0 Staff telephones HHA, Nursing Homes / Clarify orders / etc []  - 0 Routine Transfer to another Facility (non-emergent condition) []  - 0 Routine Hospital Admission (non-emergent condition) []  - 0 New Admissions / Manufacturing engineer / Ordering NPWT Apligraf, etc. , []  - 0 Emergency Hospital Admission (emergent condition) PROCESS - Special Needs []  - 0 Pediatric / Minor Patient Management []  - 0 Isolation Patient Management []  - 0 Hearing / Language / Visual special needs []  - 0 Assessment of Community assistance (transportation, D/C planning, etc.) []  - 0 Additional assistance / Altered mentation []  - 0 Support Surface(s) Assessment (bed, cushion, seat, etc.) INTERVENTIONS - Miscellaneous []  - 0 External ear exam []  - 0 Patient Transfer (multiple staff / Nurse, adult / Similar devices) []  - 0 Simple Staple  / Suture removal (25 or less) []  - 0 Complex Staple / Suture removal (26 or more) []  - 0 Hypo/Hyperglycemic Management (do not  check if billed separately) []  - 0 Ankle / Brachial Index (ABI) - do not check if billed separately Has the patient been seen at the hospital within the last three years: Yes Total Score: 0 Level Of Care: ____ Electronic Signature(s) Signed: 05/05/2023 1:40:54 PM By: [name] RN Entered By: [name] on 05/04/2023 10:59:00 -------------------------------------------------------------------------------- Encounter Discharge Information Details Patient Name: Date of Service: [name], [name] 05/04/2023 10:15 [name] Medical Record Number: [id number] Patient Account Number: [id number] Date of Birth/Sex: Treating RN: 1970/12/05 (52 y.o. [name] Primary Care Giovanne Nickolson: [name] Other Clinician: Referring Keimon Basaldua: Treating Keshon Markovitz/Extender: Joselyn Arrow [name] ([id number]) [id number] Page 3 of 10 Weeks in Treatment: 11 Encounter Discharge Information Items Post Procedure Vitals Discharge Condition: Stable Temperature (F): 98.2 Ambulatory Status: Ambulatory Pulse (bpm): 55 Discharge Destination: Home Respiratory Rate (breaths/min): 18 Transportation: Private Auto Blood Pressure (mmHg): 129/61 Accompanied By: self Schedule Follow-up Appointment: Yes Clinical Summary of Care: Electronic Signature(s) Signed: 05/05/2023 1:40:54 PM By: [name] RN Entered By: [name] on 05/04/2023 11:00:04 -------------------------------------------------------------------------------- Lower Extremity Assessment Details Patient Name: Date of Service: [name], [name] 05/04/2023 10:15 [name] Medical Record Number: [id number] Patient Account Number: [id number] Date of Birth/Sex: Treating RN: [birth year] (52 y.o. [name] Primary Care Yariela Tison: [name] Other Clinician: Referring  Lilyana Lippman: Treating Noach Calvillo/Extender: Geralynn Ochs in Treatment: 11 Edema Assessment Assessed: Kyra Searles: No] Franne Forts: No] Edema: [Left: Ye] [Right: s] Calf Left: Right: Point of Measurement: 40 cm From Medial Instep 47 cm Ankle Left: Right: Point of Measurement: 10 cm From Medial Instep 43 cm Vascular Assessment Pulses: Dorsalis Pedis Palpable: [Left:Yes] Extremity colors, hair growth, and conditions: Extremity Color: [Left:Hyperpigmented] Hair Growth on Extremity: [Left:No] Temperature of Extremity: [Left:Warm] Capillary Refill: [Left:< 3 seconds] Dependent Rubor: [Left:No] Blanched when Elevated: [Left:No No] Toe Nail Assessment Left: Right: Thick: Yes Discolored: Yes Deformed: Yes Improper Length and Hygiene: Yes Alles, [name] ([id number]) [id number] Page 4 of 10 Electronic Signature(s) Signed: 05/05/2023 1:40:54 PM By: [name] RN Entered By: [name] on 05/04/2023 10:43:01 -------------------------------------------------------------------------------- Multi Wound Chart Details Patient Name: Date of Service: [name], [name] 05/04/2023 10:15 [name] Medical Record Number: [id number] Patient Account Number: [id number] Date of Birth/Sex: Treating RN: 1970/12/29 (52 y.o. [name] Primary Care Aaleeyah Bias: [name] Other Clinician: Referring Connery Shiffler: Treating Florie Carico/Extender: Geralynn Ochs in Treatment: 11 Vital Signs Height(in): 76 Pulse(bpm): 55 Weight(lbs): 400 Blood Pressure(mmHg): 129/61 Body Mass Index(BMI): 48.7 Temperature(F): 98.2 Respiratory Rate(breaths/min): 16 [22:Photos:] [23:No Photos] [N/[name]:N/[name]] Left Lower Leg Left, Lateral Lower Leg N/[name] Wound Location: Gradually Appeared Blister N/[name] Wounding Event: Diabetic Wound/Ulcer of the Lower Diabetic Wound/Ulcer of the Lower N/[name] Primary Etiology: Extremity Extremity Hypertension, Type II Diabetes,  Hypertension, Type II Diabetes, N/[name] Comorbid History: Neuropathy Neuropathy 08/30/2022 04/27/2023 N/[name] Date Acquired: 11 1 N/[name] Weeks of Treatment: Open Open N/[name] Wound Status: No No N/[name] Wound Recurrence: 3.2x3x0.1 0x0x0 N/[name] Measurements L x W x D (cm) 7.54 0 N/[name] (cm) : rea 0.754 0 N/[name] Volume (cm) : 91.70% 100.00% N/[name] % Reduction in [name] rea: 98.60% 100.00% N/[name] % Reduction in Volume: Grade 2 Grade 1 N/[name] Classification: Medium None Present N/[name] Exudate [name] mount: Serosanguineous N/[name] N/[name] Exudate Type: red, brown N/[name] N/[name] Exudate Color: Large (67-100%) None Present (0%) N/[name] Granulation [name] mount: Red N/[name] N/[name] Granulation Quality: Small (1-33%) None Present (0%) N/[name] Necrotic [name] mount: Fat Layer (Subcutaneous Tissue): Yes  Fat Layer (Subcutaneous Tissue): Yes N/[name] Exposed Structures: Fascia: No Fascia: No Tendon: No Tendon: No Muscle: No Muscle: No Joint: No Joint: No Bone: No Bone: No None Large (67-100%) N/[name] Epithelialization: Treatment Notes Rodin, [name] ([id number]) [id number] Page 5 of 10 Electronic Signature(s) Signed: 05/05/2023 1:40:54 PM By: [name] RN Entered By: [name] on 05/04/2023 10:43:10 -------------------------------------------------------------------------------- Multi-Disciplinary Care Plan Details Patient Name: Date of Service: [name], [name] 05/04/2023 10:15 [name] Medical Record Number: [id number] Patient Account Number: [id number] Date of Birth/Sex: Treating RN: [birth year] (52 y.o. [name] Primary Care Dorell Gatlin: [name] Other Clinician: Referring Shirl Ludington: Treating Laiken Sandy/Extender: Geralynn Ochs in Treatment: 11 Active Inactive Wound/Skin Impairment Nursing Diagnoses: Knowledge deficit related to ulceration/compromised skin integrity Goals: Patient/caregiver will verbalize understanding of skin care regimen Date Initiated: 03/09/2023 Date Inactivated:  04/13/2023 Target Resolution Date: 04/09/2023 Goal Status: Met Ulcer/skin breakdown will have [name] volume reduction of 30% by week 4 Date Initiated: 03/09/2023 Date Inactivated: 04/13/2023 Target Resolution Date: 04/09/2023 Goal Status: Met Ulcer/skin breakdown will have [name] volume reduction of 50% by week 8 Date Initiated: 03/09/2023 Target Resolution Date: 05/10/2023 Goal Status: Active Ulcer/skin breakdown will have [name] volume reduction of 80% by week 12 Date Initiated: 03/09/2023 Target Resolution Date: 06/09/2023 Goal Status: Active Ulcer/skin breakdown will heal within 14 weeks Date Initiated: 03/09/2023 Target Resolution Date: 07/10/2023 Goal Status: Active Interventions: Assess patient/caregiver ability to obtain necessary supplies Assess patient/caregiver ability to perform ulcer/skin care regimen upon admission and as needed Assess ulceration(s) every visit Notes: Electronic Signature(s) Signed: 05/05/2023 1:40:54 PM By: [name] RN Entered By: [name] on 05/04/2023 10:43:32 Studley, [name] ([id number]) [id number] Page 6 of 10 -------------------------------------------------------------------------------- Pain Assessment Details Patient Name: Date of Service: [name], [name] 05/04/2023 10:15 [name] Medical Record Number: [id number] Patient Account Number: [id number] Date of Birth/Sex: Treating RN: [birth year] (52 y.o. [name]) [name] Primary Care Rontae Inglett: [name] Other Clinician: Referring Merica Prell: Treating Carrick Rijos/Extender: Geralynn Ochs in Treatment: 11 Active Problems Location of Pain Severity and Description of Pain Patient Has Paino No Site Locations Pain Management and Medication Current Pain Management: Electronic Signature(s) Signed: 05/05/2023 1:40:54 PM By: [name] RN Entered By: [name] on 05/04/2023  10:24:04 -------------------------------------------------------------------------------- Patient/Caregiver Education Details Patient Name: Date of Service: [name], [name] 9/4/2024andnbsp10:15 [name] Medical Record Number: [id number] Patient Account Number: [id number] Date of Birth/Gender: Treating RN: 1971/05/12 (52 y.o. [name] Primary Care Physician: [name] Other Clinician: Referring Physician: Treating Physician/Extender: Geralynn Ochs in Treatment: [address] [place] [name] ([id number]) [id number] Page 7 of 10 Education Assessment Education Provided To: Patient Education Topics Provided Wound/Skin Impairment: Handouts: Caring for Your Ulcer Methods: Explain/Verbal Responses: State content correctly Electronic Signature(s) Signed: 05/05/2023 1:40:54 PM By: [name] RN Entered By: [name] on 05/04/2023 10:43:59 -------------------------------------------------------------------------------- Wound Assessment Details Patient Name: Date of Service: [name], [name] 05/04/2023 10:15 [name] Medical Record Number: [id number] Patient Account Number: [id number] Date of Birth/Sex: Treating RN: [birth year] (52 y.o. [name] Primary Care Latamara Melder: [name] Other Clinician: Referring Shenetta Schnackenberg: Treating Anahy Esh/Extender: Geralynn Ochs in Treatment: 11 Wound Status Wound Number: 22 Primary Etiology: Diabetic Wound/Ulcer of the Lower Extremity Wound Location: Left Lower Leg Wound Status: Open Wounding Event: Gradually Appeared Comorbid History: Hypertension, Type II Diabetes, Neuropathy Date Acquired: 08/30/2022 Weeks Of Treatment: 11 Clustered Wound: No Photos Wound Measurements Length: (cm) 3.2  Width: (cm) 3 Depth: (cm) 0.1 Area: (cm) 7.54 Volume: (cm) 0.754 % Reduction in Area: 91.7% % Reduction in Volume: 98.6% Epithelialization: None Tunneling: No Undermining:  No Wound Description Classification: Grade 2 Exudate Amount: Medium Exudate Type: Serosanguineous Sahota, [name] ([id number]) Exudate Color: red, brown Foul Odor After Cleansing: No Slough/Fibrino Yes [id number] Page 8 of 10 Wound Bed Granulation Amount: Large (67-100%) Exposed Structure Granulation Quality: Red Fascia Exposed: No Necrotic Amount: Small (1-33%) Fat Layer (Subcutaneous Tissue) Exposed: Yes Necrotic Quality: Adherent Slough Tendon Exposed: No Muscle Exposed: No Joint Exposed: No Bone Exposed: No Treatment Notes Wound #22 (Lower Leg) Wound Laterality: Left Cleanser Soap and Water Discharge Instruction: Gently cleanse wound with antibacterial soap, rinse and pat dry prior to dressing wounds Wound Cleanser Discharge Instruction: Wash your hands with soap and water. Remove old dressing, discard into plastic bag and place into trash. Cleanse the wound with Wound Cleanser prior to applying [name] clean dressing using gauze sponges, not tissues or cotton balls. Do not scrub or use excessive force. Pat dry using gauze sponges, not tissue or cotton balls. Peri-Wound Care Topical Primary Dressing Silvercel 4 1/4x 4 1/4 (in/in) Discharge Instruction: Apply Silvercel 4 1/4x 4 1/4 (in/in) as instructed Secondary Dressing Zetuvit Plus 4x4 (in/in) Secured With Stretch Net Dressing, Latex-free, Size 5, Small-Head / Shoulder / Thigh Discharge Instruction: over wrap for comfort Compression Wrap Urgo K2 Lite, two layer compression system, large Compression Stockings Add-Ons Electronic Signature(s) Signed: 05/05/2023 1:40:54 PM By: [name] RN Entered By: [name] on 05/04/2023 10:40:03 -------------------------------------------------------------------------------- Wound Assessment Details Patient Name: Date of Service: [name], [name] 05/04/2023 10:15 [name] Medical Record Number: [id number] Patient Account Number: [id number] Date of Birth/Sex:  Treating RN: 1971/08/12 (52 y.o. [name] Primary Care Linnae Rasool: [name] Other Clinician: Referring Leroy Trim: Treating Raheen Capili/Extender: Geralynn Ochs in Treatment: 11 Wound Status Wound Number: 23 Primary Etiology: Diabetic Wound/Ulcer of the Lower Extremity Wound Location: Left, Lateral Lower Leg Wound Status: Open Wounding Event: Blister Comorbid History: Hypertension, Type II Diabetes, Neuropathy Date Acquired: 04/27/2023 [name], [name] [phone] Page 9 of 10 Weeks Of Treatment: 1 Clustered Wound: No Wound Measurements Length: (cm) Width: (cm) Depth: (cm) Area: (cm) Volume: (cm) 0 % Reduction in Area: 100% 0 % Reduction in Volume: 100% 0 Epithelialization: Large (67-100%) 0 Tunneling: No 0 Undermining: No Wound Description Classification: Grade 1 Exudate Amount: None Present Foul Odor After Cleansing: No Slough/Fibrino No Wound Bed Granulation Amount: None Present (0%) Exposed Structure Necrotic Amount: None Present (0%) Fascia Exposed: No Fat Layer (Subcutaneous Tissue) Exposed: Yes Tendon Exposed: No Muscle Exposed: No Joint Exposed: No Bone Exposed: No Treatment Notes Wound #23 (Lower Leg) Wound Laterality: Left, Lateral Cleanser Soap and Water Discharge Instruction: Gently cleanse wound with antibacterial soap, rinse and pat dry prior to dressing wounds Wound Cleanser Discharge Instruction: Wash your hands with soap and water. Remove old dressing, discard into plastic bag and place into trash. Cleanse the wound with Wound Cleanser prior to applying [name] clean dressing using gauze sponges, not tissues or cotton balls. Do not scrub or use excessive force. Pat dry using gauze sponges, not tissue or cotton balls. Peri-Wound Care Topical Primary Dressing Silvercel 4 1/4x 4 1/4 (in/in) Discharge Instruction: Apply Silvercel 4 1/4x 4 1/4 (in/in) as instructed Secondary  Dressing Zetuvit Plus 4x4 (in/in) Secured With [employer] Dressing, Latex-free, Size 5, Small-Head / Shoulder / Thigh Discharge Instruction: over wrap for comfort Compression Wrap Urgo K2  Lite, two layer compression system, large Compression Stockings Add-Ons Electronic Signature(s) Signed: 05/05/2023 1:40:54 PM By: [name] RN Entered By: [name] on 05/04/2023 10:41:07 Allaire, [name] ([id number]) [id number] Page 10 of 10 -------------------------------------------------------------------------------- Vitals Details Patient Name: Date of Service: [name], [name] 05/04/2023 10:15 [name] Medical Record Number: [id number] Patient Account Number: [id number] Date of Birth/Sex: Treating RN: 1971/08/23 (52 y.o. [name]) [name] Primary Care Unity Luepke: [name] Other Clinician: Referring Lakrista Scaduto: Treating Druanne Bosques/Extender: Geralynn Ochs in Treatment: 11 Vital Signs Time Taken: 10:22 Temperature (F): 98.2 Height (in): 76 Pulse (bpm): 55 Weight (lbs): 400 Respiratory Rate (breaths/min): 16 Body Mass Index (BMI): 48.7 Blood Pressure (mmHg): 129/61 Reference Range: 80 - 120 mg / dl Electronic Signature(s) Signed: 05/05/2023 1:40:54 PM By: [name] RN Entered By: [name] on 05/04/2023 10:23:54

## 2023-05-06 DIAGNOSIS — J449 Chronic obstructive pulmonary disease, unspecified: Secondary | ICD-10-CM | POA: Diagnosis not present

## 2023-05-06 DIAGNOSIS — E1051 Type 1 diabetes mellitus with diabetic peripheral angiopathy without gangrene: Secondary | ICD-10-CM | POA: Diagnosis not present

## 2023-05-06 DIAGNOSIS — B954 Other streptococcus as the cause of diseases classified elsewhere: Secondary | ICD-10-CM | POA: Diagnosis not present

## 2023-05-06 DIAGNOSIS — E1022 Type 1 diabetes mellitus with diabetic chronic kidney disease: Secondary | ICD-10-CM | POA: Diagnosis not present

## 2023-05-06 DIAGNOSIS — N186 End stage renal disease: Secondary | ICD-10-CM | POA: Diagnosis not present

## 2023-05-06 DIAGNOSIS — R7881 Bacteremia: Secondary | ICD-10-CM | POA: Diagnosis not present

## 2023-05-06 DIAGNOSIS — I872 Venous insufficiency (chronic) (peripheral): Secondary | ICD-10-CM | POA: Diagnosis not present

## 2023-05-06 DIAGNOSIS — L97929 Non-pressure chronic ulcer of unspecified part of left lower leg with unspecified severity: Secondary | ICD-10-CM | POA: Diagnosis not present

## 2023-05-06 DIAGNOSIS — I120 Hypertensive chronic kidney disease with stage 5 chronic kidney disease or end stage renal disease: Secondary | ICD-10-CM | POA: Diagnosis not present

## 2023-05-07 DIAGNOSIS — N186 End stage renal disease: Secondary | ICD-10-CM | POA: Diagnosis not present

## 2023-05-07 DIAGNOSIS — L97929 Non-pressure chronic ulcer of unspecified part of left lower leg with unspecified severity: Secondary | ICD-10-CM | POA: Diagnosis not present

## 2023-05-07 DIAGNOSIS — R7881 Bacteremia: Secondary | ICD-10-CM | POA: Diagnosis not present

## 2023-05-07 DIAGNOSIS — N2581 Secondary hyperparathyroidism of renal origin: Secondary | ICD-10-CM | POA: Diagnosis not present

## 2023-05-07 DIAGNOSIS — Z992 Dependence on renal dialysis: Secondary | ICD-10-CM | POA: Diagnosis not present

## 2023-05-10 DIAGNOSIS — Z992 Dependence on renal dialysis: Secondary | ICD-10-CM | POA: Diagnosis not present

## 2023-05-10 DIAGNOSIS — L97929 Non-pressure chronic ulcer of unspecified part of left lower leg with unspecified severity: Secondary | ICD-10-CM | POA: Diagnosis not present

## 2023-05-10 DIAGNOSIS — N186 End stage renal disease: Secondary | ICD-10-CM | POA: Diagnosis not present

## 2023-05-10 DIAGNOSIS — E1051 Type 1 diabetes mellitus with diabetic peripheral angiopathy without gangrene: Secondary | ICD-10-CM | POA: Diagnosis not present

## 2023-05-10 DIAGNOSIS — J449 Chronic obstructive pulmonary disease, unspecified: Secondary | ICD-10-CM | POA: Diagnosis not present

## 2023-05-10 DIAGNOSIS — I872 Venous insufficiency (chronic) (peripheral): Secondary | ICD-10-CM | POA: Diagnosis not present

## 2023-05-10 DIAGNOSIS — I120 Hypertensive chronic kidney disease with stage 5 chronic kidney disease or end stage renal disease: Secondary | ICD-10-CM | POA: Diagnosis not present

## 2023-05-10 DIAGNOSIS — B954 Other streptococcus as the cause of diseases classified elsewhere: Secondary | ICD-10-CM | POA: Diagnosis not present

## 2023-05-10 DIAGNOSIS — R7881 Bacteremia: Secondary | ICD-10-CM | POA: Diagnosis not present

## 2023-05-10 DIAGNOSIS — E1022 Type 1 diabetes mellitus with diabetic chronic kidney disease: Secondary | ICD-10-CM | POA: Diagnosis not present

## 2023-05-10 DIAGNOSIS — N2581 Secondary hyperparathyroidism of renal origin: Secondary | ICD-10-CM | POA: Diagnosis not present

## 2023-05-11 ENCOUNTER — Encounter (HOSPITAL_BASED_OUTPATIENT_CLINIC_OR_DEPARTMENT_OTHER): Payer: Medicare HMO | Admitting: Internal Medicine

## 2023-05-11 DIAGNOSIS — I87312 Chronic venous hypertension (idiopathic) with ulcer of left lower extremity: Secondary | ICD-10-CM

## 2023-05-11 DIAGNOSIS — Z792 Long term (current) use of antibiotics: Secondary | ICD-10-CM | POA: Diagnosis not present

## 2023-05-11 DIAGNOSIS — L97822 Non-pressure chronic ulcer of other part of left lower leg with fat layer exposed: Secondary | ICD-10-CM

## 2023-05-11 DIAGNOSIS — I890 Lymphedema, not elsewhere classified: Secondary | ICD-10-CM | POA: Diagnosis not present

## 2023-05-11 DIAGNOSIS — I120 Hypertensive chronic kidney disease with stage 5 chronic kidney disease or end stage renal disease: Secondary | ICD-10-CM | POA: Diagnosis not present

## 2023-05-11 DIAGNOSIS — E1040 Type 1 diabetes mellitus with diabetic neuropathy, unspecified: Secondary | ICD-10-CM | POA: Diagnosis not present

## 2023-05-11 DIAGNOSIS — E10622 Type 1 diabetes mellitus with other skin ulcer: Secondary | ICD-10-CM | POA: Diagnosis not present

## 2023-05-11 DIAGNOSIS — E11622 Type 2 diabetes mellitus with other skin ulcer: Secondary | ICD-10-CM

## 2023-05-11 DIAGNOSIS — L97522 Non-pressure chronic ulcer of other part of left foot with fat layer exposed: Secondary | ICD-10-CM | POA: Diagnosis not present

## 2023-05-11 DIAGNOSIS — E1022 Type 1 diabetes mellitus with diabetic chronic kidney disease: Secondary | ICD-10-CM | POA: Diagnosis not present

## 2023-05-11 DIAGNOSIS — N186 End stage renal disease: Secondary | ICD-10-CM | POA: Diagnosis not present

## 2023-05-12 DIAGNOSIS — N186 End stage renal disease: Secondary | ICD-10-CM | POA: Diagnosis not present

## 2023-05-12 DIAGNOSIS — R7881 Bacteremia: Secondary | ICD-10-CM | POA: Diagnosis not present

## 2023-05-12 DIAGNOSIS — L97929 Non-pressure chronic ulcer of unspecified part of left lower leg with unspecified severity: Secondary | ICD-10-CM | POA: Diagnosis not present

## 2023-05-12 DIAGNOSIS — Z992 Dependence on renal dialysis: Secondary | ICD-10-CM | POA: Diagnosis not present

## 2023-05-12 DIAGNOSIS — N2581 Secondary hyperparathyroidism of renal origin: Secondary | ICD-10-CM | POA: Diagnosis not present

## 2023-05-13 NOTE — Progress Notes (Signed)
[name], [name] ([id number]) Exudate Color: red, brown Foul Odor After Cleansing: No Slough/Fibrino Yes [id number] Page 8 of 9 Wound Bed Granulation Amount: Large (67-100%) Exposed Structure Granulation Quality: Red Fascia Exposed: No Necrotic Amount: Small  (1-33%) Fat Layer (Subcutaneous Tissue) Exposed: Yes Necrotic Quality: Adherent Slough Tendon Exposed: No Muscle Exposed: No Joint Exposed: No Bone Exposed: No Treatment Notes Wound #22 (Lower Leg) Wound Laterality: Left Cleanser Soap and Water Discharge Instruction: Gently cleanse wound with antibacterial soap, rinse and pat dry prior to dressing wounds Wound Cleanser Discharge Instruction: Wash your hands with soap and water. Remove old dressing, discard into plastic bag and place into trash. Cleanse the wound with Wound Cleanser prior to applying [name] clean dressing using gauze sponges, not tissues or cotton balls. Do not scrub or use excessive force. Pat dry using gauze sponges, not tissue or cotton balls. Peri-Wound Care Topical Primary Dressing Silvercel 4 1/4x 4 1/4 (in/in) Discharge Instruction: Apply Silvercel 4 1/4x 4 1/4 (in/in) as instructed Secondary Dressing Zetuvit Plus 4x4 (in/in) Secured With Stretch Net Dressing, Latex-free, Size 5, Small-Head / Shoulder / Thigh Discharge Instruction: over wrap for comfort Compression Wrap Urgo K2 Lite, two layer compression system, large Compression Stockings Add-Ons Electronic Signature(s) Signed: 05/13/2023 1:15:19 PM By: [name] Pax RN Entered By: [name] on 05/11/2023 10:34:54 -------------------------------------------------------------------------------- Vitals Details Patient Name: Date of Service: [name], [name] 05/11/2023 10:15 [name] Medical Record Number: [id number] Patient Account Number: [id number] Date of Birth/Sex: Treating RN: Jan 25, 1971 (52 y.o. [name] Primary Care Aza Dantes: [name] Other Clinician: Referring Yariah Selvey: Treating Eloyce Bultman/Extender: [name] in Treatment: 12 Vital Signs Time Taken: 10:24 Temperature (F): 98.1 Height (in): 76 Pulse (bpm): 59 Weight (lbs): 400 Respiratory Rate (breaths/min): 18 [name], [name] ([id number])  [id number] Page 9 of 9 Body Mass Index (BMI): 48.7 Blood Pressure (mmHg): 165/80 Reference Range: 80 - 120 mg / dl Electronic Signature(s) Signed: 05/13/2023 1:15:19 PM By: [name] Pax RN Entered By: [name] on 05/11/2023 10:24:51  [name], [name] ([id number]) Exudate Color: red, brown Foul Odor After Cleansing: No Slough/Fibrino Yes [id number] Page 8 of 9 Wound Bed Granulation Amount: Large (67-100%) Exposed Structure Granulation Quality: Red Fascia Exposed: No Necrotic Amount: Small  (1-33%) Fat Layer (Subcutaneous Tissue) Exposed: Yes Necrotic Quality: Adherent Slough Tendon Exposed: No Muscle Exposed: No Joint Exposed: No Bone Exposed: No Treatment Notes Wound #22 (Lower Leg) Wound Laterality: Left Cleanser Soap and Water Discharge Instruction: Gently cleanse wound with antibacterial soap, rinse and pat dry prior to dressing wounds Wound Cleanser Discharge Instruction: Wash your hands with soap and water. Remove old dressing, discard into plastic bag and place into trash. Cleanse the wound with Wound Cleanser prior to applying [name] clean dressing using gauze sponges, not tissues or cotton balls. Do not scrub or use excessive force. Pat dry using gauze sponges, not tissue or cotton balls. Peri-Wound Care Topical Primary Dressing Silvercel 4 1/4x 4 1/4 (in/in) Discharge Instruction: Apply Silvercel 4 1/4x 4 1/4 (in/in) as instructed Secondary Dressing Zetuvit Plus 4x4 (in/in) Secured With Stretch Net Dressing, Latex-free, Size 5, Small-Head / Shoulder / Thigh Discharge Instruction: over wrap for comfort Compression Wrap Urgo K2 Lite, two layer compression system, large Compression Stockings Add-Ons Electronic Signature(s) Signed: 05/13/2023 1:15:19 PM By: [name] Pax RN Entered By: [name] on 05/11/2023 10:34:54 -------------------------------------------------------------------------------- Vitals Details Patient Name: Date of Service: [name], [name] 05/11/2023 10:15 [name] Medical Record Number: [id number] Patient Account Number: [id number] Date of Birth/Sex: Treating RN: Jan 25, 1971 (52 y.o. [name] Primary Care Aza Dantes: [name] Other Clinician: Referring Yariah Selvey: Treating Eloyce Bultman/Extender: [name] in Treatment: 12 Vital Signs Time Taken: 10:24 Temperature (F): 98.1 Height (in): 76 Pulse (bpm): 59 Weight (lbs): 400 Respiratory Rate (breaths/min): 18 [name], [name] ([id number])  [id number] Page 9 of 9 Body Mass Index (BMI): 48.7 Blood Pressure (mmHg): 165/80 Reference Range: 80 - 120 mg / dl Electronic Signature(s) Signed: 05/13/2023 1:15:19 PM By: [name] Pax RN Entered By: [name] on 05/11/2023 10:24:51  check if billed separately) []  - 0 Ankle / Brachial Index (ABI) - do not check if billed separately Has the patient been seen at the hospital within the last three years: Yes Total Score: 0 Level Of Care: ____ Electronic Signature(s) Signed: 05/13/2023 1:15:19 PM By: [name] Pax RN Entered By: [name] on 05/11/2023 10:46:18 -------------------------------------------------------------------------------- Encounter Discharge Information Details Patient Name: Date of Service: [name], [name] 05/11/2023 10:15 [name] Medical Record Number: [id number] Patient Account Number: [id number] Date of Birth/Sex: Treating RN: May 13, 1971 (52 y.o. [name] Primary Care Patrecia Veiga: [name] Other Clinician: Referring [name]: Treating [name]/Extender: [name], [name] ([id number]) [id number] Page 3 of 9 Weeks in Treatment: 12 Encounter Discharge Information Items Post Procedure Vitals Discharge Condition: Stable Temperature (F): 98.1 Ambulatory Status: Ambulatory Pulse (bpm): 59 Discharge Destination: Home Respiratory Rate (breaths/min): 18 Transportation: Private Auto Blood Pressure (mmHg): 165/80 Accompanied By: self Schedule Follow-up Appointment: Yes Clinical Summary of Care: Electronic Signature(s) Signed: 05/13/2023 1:15:19 PM By: [name] Pax RN Entered By: [name] on 05/11/2023 10:47:49 -------------------------------------------------------------------------------- Lower Extremity Assessment Details Patient Name: Date of Service: [name], [name] 05/11/2023 10:15 [name] Medical Record Number: [id number] Patient Account Number: [id number] Date of Birth/Sex: Treating RN: 06/25/1971 (52 y.o. [name] Primary Care Kaydan Wong: [name] Other Clinician: Referring  Cannie Muckle: Treating [name]/Extender: [name] in Treatment: 12 Edema Assessment Assessed: [name]: No] [name]: No] Edema: [Left: Ye] [Right: s] Calf Left: Right: Point of Measurement: 40 cm From Medial Instep 46 cm Ankle Left: Right: Point of Measurement: 10 cm From Medial Instep 43 cm Vascular Assessment Pulses: Dorsalis Pedis Palpable: [Left:Yes] Extremity colors, hair growth, and conditions: Hair Growth on Extremity: [Left:No] Temperature of Extremity: [Left:Warm] Capillary Refill: [Left:< 3 seconds] Dependent Rubor: [Left:No] Blanched when Elevated: [Left:No Yes] Toe Nail Assessment Left: Right: Thick: Yes Discolored: Yes Deformed: Yes Improper Length and Hygiene: Yes Electronic Signature(s) Schnoebelen, Cordarrius [name] ([id number]) [id number] Page 4 of 9 Signed: 05/13/2023 1:15:19 PM By: [name] Pax RN Entered By: [name] on 05/11/2023 10:35:43 -------------------------------------------------------------------------------- Multi Wound Chart Details Patient Name: Date of Service: [name], [name] 05/11/2023 10:15 [name] Medical Record Number: [id number] Patient Account Number: [id number] Date of Birth/Sex: Treating RN: 1971/03/06 (52 y.o. 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/[name] N/[name] Primary Etiology: Extremity Hypertension, Type II Diabetes, N/[name] N/[name] Comorbid History: Neuropathy 08/30/2022 N/[name] N/[name] Date Acquired: 12 N/[name] N/[name] Weeks of Treatment: Open N/[name] N/[name] Wound  Status: No N/[name] N/[name] Wound Recurrence: 2.8x2.8x0.1 N/[name] N/[name] Measurements L x W x D (cm) 6.158 N/[name] N/[name] (cm) : rea 0.616 N/[name] N/[name] Volume (cm) : 93.20% N/[name] N/[name] % Reduction in [name] rea: 98.90% N/[name] N/[name] % Reduction in Volume: Grade 2 N/[name] N/[name] Classification: Medium N/[name] N/[name] Exudate [name] mount: Serosanguineous N/[name] N/[name] Exudate Type: red, brown N/[name] N/[name] Exudate Color: Large (67-100%) N/[name] N/[name] Granulation [name] mount: Red N/[name] N/[name] Granulation Quality: Small (1-33%) N/[name] N/[name] Necrotic [name] mount: Fat Layer (Subcutaneous Tissue): Yes N/[name] N/[name] Exposed Structures: Fascia: No Tendon: No Muscle: No Joint: No Bone: No None N/[name] N/[name] Epithelialization: Treatment Notes Electronic Signature(s) Humbarger,  check if billed separately) []  - 0 Ankle / Brachial Index (ABI) - do not check if billed separately Has the patient been seen at the hospital within the last three years: Yes Total Score: 0 Level Of Care: ____ Electronic Signature(s) Signed: 05/13/2023 1:15:19 PM By: [name] Pax RN Entered By: [name] on 05/11/2023 10:46:18 -------------------------------------------------------------------------------- Encounter Discharge Information Details Patient Name: Date of Service: [name], [name] 05/11/2023 10:15 [name] Medical Record Number: [id number] Patient Account Number: [id number] Date of Birth/Sex: Treating RN: May 13, 1971 (52 y.o. [name] Primary Care Patrecia Veiga: [name] Other Clinician: Referring [name]: Treating [name]/Extender: [name], [name] ([id number]) [id number] Page 3 of 9 Weeks in Treatment: 12 Encounter Discharge Information Items Post Procedure Vitals Discharge Condition: Stable Temperature (F): 98.1 Ambulatory Status: Ambulatory Pulse (bpm): 59 Discharge Destination: Home Respiratory Rate (breaths/min): 18 Transportation: Private Auto Blood Pressure (mmHg): 165/80 Accompanied By: self Schedule Follow-up Appointment: Yes Clinical Summary of Care: Electronic Signature(s) Signed: 05/13/2023 1:15:19 PM By: [name] Pax RN Entered By: [name] on 05/11/2023 10:47:49 -------------------------------------------------------------------------------- Lower Extremity Assessment Details Patient Name: Date of Service: [name], [name] 05/11/2023 10:15 [name] Medical Record Number: [id number] Patient Account Number: [id number] Date of Birth/Sex: Treating RN: 06/25/1971 (52 y.o. [name] Primary Care Kaydan Wong: [name] Other Clinician: Referring  Cannie Muckle: Treating [name]/Extender: [name] in Treatment: 12 Edema Assessment Assessed: [name]: No] [name]: No] Edema: [Left: Ye] [Right: s] Calf Left: Right: Point of Measurement: 40 cm From Medial Instep 46 cm Ankle Left: Right: Point of Measurement: 10 cm From Medial Instep 43 cm Vascular Assessment Pulses: Dorsalis Pedis Palpable: [Left:Yes] Extremity colors, hair growth, and conditions: Hair Growth on Extremity: [Left:No] Temperature of Extremity: [Left:Warm] Capillary Refill: [Left:< 3 seconds] Dependent Rubor: [Left:No] Blanched when Elevated: [Left:No Yes] Toe Nail Assessment Left: Right: Thick: Yes Discolored: Yes Deformed: Yes Improper Length and Hygiene: Yes Electronic Signature(s) Schnoebelen, Cordarrius [name] ([id number]) [id number] Page 4 of 9 Signed: 05/13/2023 1:15:19 PM By: [name] Pax RN Entered By: [name] on 05/11/2023 10:35:43 -------------------------------------------------------------------------------- Multi Wound Chart Details Patient Name: Date of Service: [name], [name] 05/11/2023 10:15 [name] Medical Record Number: [id number] Patient Account Number: [id number] Date of Birth/Sex: Treating RN: 1971/03/06 (52 y.o. 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/[name] N/[name] Primary Etiology: Extremity Hypertension, Type II Diabetes, N/[name] N/[name] Comorbid History: Neuropathy 08/30/2022 N/[name] N/[name] Date Acquired: 12 N/[name] N/[name] Weeks of Treatment: Open N/[name] N/[name] Wound  Status: No N/[name] N/[name] Wound Recurrence: 2.8x2.8x0.1 N/[name] N/[name] Measurements L x W x D (cm) 6.158 N/[name] N/[name] (cm) : rea 0.616 N/[name] N/[name] Volume (cm) : 93.20% N/[name] N/[name] % Reduction in [name] rea: 98.90% N/[name] N/[name] % Reduction in Volume: Grade 2 N/[name] N/[name] Classification: Medium N/[name] N/[name] Exudate [name] mount: Serosanguineous N/[name] N/[name] Exudate Type: red, brown N/[name] N/[name] Exudate Color: Large (67-100%) N/[name] N/[name] Granulation [name] mount: Red N/[name] N/[name] Granulation Quality: Small (1-33%) N/[name] N/[name] Necrotic [name] mount: Fat Layer (Subcutaneous Tissue): Yes N/[name] N/[name] Exposed Structures: Fascia: No Tendon: No Muscle: No Joint: No Bone: No None N/[name] N/[name] Epithelialization: Treatment Notes Electronic Signature(s) Humbarger,

## 2023-05-14 DIAGNOSIS — N186 End stage renal disease: Secondary | ICD-10-CM | POA: Diagnosis not present

## 2023-05-14 DIAGNOSIS — R7881 Bacteremia: Secondary | ICD-10-CM | POA: Diagnosis not present

## 2023-05-14 DIAGNOSIS — Z992 Dependence on renal dialysis: Secondary | ICD-10-CM | POA: Diagnosis not present

## 2023-05-14 DIAGNOSIS — N2581 Secondary hyperparathyroidism of renal origin: Secondary | ICD-10-CM | POA: Diagnosis not present

## 2023-05-14 DIAGNOSIS — L97929 Non-pressure chronic ulcer of unspecified part of left lower leg with unspecified severity: Secondary | ICD-10-CM | POA: Diagnosis not present

## 2023-05-16 DIAGNOSIS — L97929 Non-pressure chronic ulcer of unspecified part of left lower leg with unspecified severity: Secondary | ICD-10-CM | POA: Diagnosis not present

## 2023-05-16 DIAGNOSIS — E1051 Type 1 diabetes mellitus with diabetic peripheral angiopathy without gangrene: Secondary | ICD-10-CM | POA: Diagnosis not present

## 2023-05-16 DIAGNOSIS — I872 Venous insufficiency (chronic) (peripheral): Secondary | ICD-10-CM | POA: Diagnosis not present

## 2023-05-16 DIAGNOSIS — R7881 Bacteremia: Secondary | ICD-10-CM | POA: Diagnosis not present

## 2023-05-16 DIAGNOSIS — J449 Chronic obstructive pulmonary disease, unspecified: Secondary | ICD-10-CM | POA: Diagnosis not present

## 2023-05-16 DIAGNOSIS — E1022 Type 1 diabetes mellitus with diabetic chronic kidney disease: Secondary | ICD-10-CM | POA: Diagnosis not present

## 2023-05-16 DIAGNOSIS — N186 End stage renal disease: Secondary | ICD-10-CM | POA: Diagnosis not present

## 2023-05-16 DIAGNOSIS — B954 Other streptococcus as the cause of diseases classified elsewhere: Secondary | ICD-10-CM | POA: Diagnosis not present

## 2023-05-16 DIAGNOSIS — I120 Hypertensive chronic kidney disease with stage 5 chronic kidney disease or end stage renal disease: Secondary | ICD-10-CM | POA: Diagnosis not present

## 2023-05-17 DIAGNOSIS — N186 End stage renal disease: Secondary | ICD-10-CM | POA: Diagnosis not present

## 2023-05-17 DIAGNOSIS — L97929 Non-pressure chronic ulcer of unspecified part of left lower leg with unspecified severity: Secondary | ICD-10-CM | POA: Diagnosis not present

## 2023-05-17 DIAGNOSIS — N2581 Secondary hyperparathyroidism of renal origin: Secondary | ICD-10-CM | POA: Diagnosis not present

## 2023-05-17 DIAGNOSIS — Z992 Dependence on renal dialysis: Secondary | ICD-10-CM | POA: Diagnosis not present

## 2023-05-17 DIAGNOSIS — R7881 Bacteremia: Secondary | ICD-10-CM | POA: Diagnosis not present

## 2023-05-18 ENCOUNTER — Encounter: Payer: Medicare HMO | Admitting: Internal Medicine

## 2023-05-18 DIAGNOSIS — E10622 Type 1 diabetes mellitus with other skin ulcer: Secondary | ICD-10-CM | POA: Diagnosis not present

## 2023-05-18 DIAGNOSIS — L97522 Non-pressure chronic ulcer of other part of left foot with fat layer exposed: Secondary | ICD-10-CM | POA: Diagnosis not present

## 2023-05-18 DIAGNOSIS — I120 Hypertensive chronic kidney disease with stage 5 chronic kidney disease or end stage renal disease: Secondary | ICD-10-CM | POA: Diagnosis not present

## 2023-05-18 DIAGNOSIS — E1022 Type 1 diabetes mellitus with diabetic chronic kidney disease: Secondary | ICD-10-CM | POA: Diagnosis not present

## 2023-05-18 DIAGNOSIS — E11622 Type 2 diabetes mellitus with other skin ulcer: Secondary | ICD-10-CM | POA: Diagnosis not present

## 2023-05-18 DIAGNOSIS — N186 End stage renal disease: Secondary | ICD-10-CM | POA: Diagnosis not present

## 2023-05-18 DIAGNOSIS — I890 Lymphedema, not elsewhere classified: Secondary | ICD-10-CM | POA: Diagnosis not present

## 2023-05-18 DIAGNOSIS — Z792 Long term (current) use of antibiotics: Secondary | ICD-10-CM | POA: Diagnosis not present

## 2023-05-18 DIAGNOSIS — L97822 Non-pressure chronic ulcer of other part of left lower leg with fat layer exposed: Secondary | ICD-10-CM | POA: Diagnosis not present

## 2023-05-18 DIAGNOSIS — E1040 Type 1 diabetes mellitus with diabetic neuropathy, unspecified: Secondary | ICD-10-CM | POA: Diagnosis not present

## 2023-05-19 DIAGNOSIS — L97929 Non-pressure chronic ulcer of unspecified part of left lower leg with unspecified severity: Secondary | ICD-10-CM | POA: Diagnosis not present

## 2023-05-19 DIAGNOSIS — R7881 Bacteremia: Secondary | ICD-10-CM | POA: Diagnosis not present

## 2023-05-19 DIAGNOSIS — Z992 Dependence on renal dialysis: Secondary | ICD-10-CM | POA: Diagnosis not present

## 2023-05-19 DIAGNOSIS — N186 End stage renal disease: Secondary | ICD-10-CM | POA: Diagnosis not present

## 2023-05-19 DIAGNOSIS — N2581 Secondary hyperparathyroidism of renal origin: Secondary | ICD-10-CM | POA: Diagnosis not present

## 2023-05-19 NOTE — Progress Notes (Signed)
No N/A N/A Wound Recurrence: 2x2.5x0.1 N/A N/A Measurements L x W x D (cm) 3.927 N/A N/A A (cm) : rea 0.393 N/A N/A Volume (cm) : 95.70% N/A N/A % Reduction in A rea: 99.30% N/A N/A % Reduction in Volume: Grade 2 N/A N/A Classification: Medium N/A N/A Exudate A mount: Serosanguineous N/A N/A Exudate Type: red, brown N/A N/A Exudate Color: Large (67-100%) N/A N/A Granulation A mount: Red N/A N/A Granulation Quality: Small (1-33%) N/A N/A Necrotic A mount: Fat Layer (Subcutaneous Tissue): Yes N/A N/A Exposed Structures: Fascia: No Tendon: No Muscle: No Joint: No Bone: No None N/A N/A Epithelialization: Treatment Notes Electronic Signature(s) Signed: 05/19/2023 1:18:15 PM By: [name] RN Entered By: [name] on 05/18/2023 10:27:11 -------------------------------------------------------------------------------- Multi-Disciplinary Care Plan Details Patient Name: Date of Service: [name], [name] 05/18/2023 10:15 A M Medical Record Number: [id number] Patient Account Number: [id number] Date of Birth/Sex: Treating RN: 1970/09/22 (52 y.o. [name] Primary Care Jelan Batterton: [name] Other  Clinician: Referring Akeela Busk: Treating Jaimin Krupka/Extender: RO BSO N, MICHA EL [place] in Treatment: 13 Active Inactive Wound/Skin Impairment Nursing Diagnoses: Knowledge deficit related to ulceration/compromised skin integrity Goals: Patient/caregiver will verbalize understanding of skin care regimen Date Initiated: 03/09/2023 Date Inactivated: 04/13/2023 Target Resolution Date: 04/09/2023 Goal Status: Met Ulcer/skin breakdown will have a volume reduction of 30% by week 4 Date Initiated: 03/09/2023 Date Inactivated: 04/13/2023 Target Resolution Date: 04/09/2023 MATRIX, SCHWIND ([id number]) [id number] Page 6 of 9 Goal Status: Met Ulcer/skin breakdown will have a volume reduction of 50% by week 8 Date Initiated: 03/09/2023 Date Inactivated: 05/18/2023 Target Resolution Date: 05/10/2023 Goal Status: Met Ulcer/skin breakdown will have a volume reduction of 80% by week 12 Date Initiated: 03/09/2023 Target Resolution Date: 06/09/2023 Goal Status: Active Ulcer/skin breakdown will heal within 14 weeks Date Initiated: 03/09/2023 Target Resolution Date: 07/10/2023 Goal Status: Active Interventions: Assess patient/caregiver ability to obtain necessary supplies Assess patient/caregiver ability to perform ulcer/skin care regimen upon admission and as needed Assess ulceration(s) every visit Notes: Electronic Signature(s) Signed: 05/19/2023 1:18:15 PM By: [name] RN Entered By: [name] on 05/18/2023 10:27:40 -------------------------------------------------------------------------------- Pain Assessment Details Patient Name: Date of Service: [name], [name] 05/18/2023 10:15 A M Medical Record Number: [id number] Patient Account Number: [id number] Date of Birth/Sex: Treating RN: 1971/02/03 (52 y.o. [name] Primary Care Franciscojavier Wronski: [name] Other Clinician: Referring Nera Haworth: Treating Vignesh Willert/Extender: RO BSO N, MICHA EL  [place] in Treatment: 13 Active Problems Location of Pain Severity and Description of Pain Patient Has Paino No Site Locations Pain Management and Medication Current Pain Management: Electronic Signature(s) Signed: 05/19/2023 1:18:15 PM By: [name] RN Leonor Liv, Wai A ([id number]) [id number] Page 7 of 9 Entered By: [name] on 05/18/2023 10:20:18 -------------------------------------------------------------------------------- Patient/Caregiver Education Details Patient Name: Date of Service: [name], [name] 9/18/2024andnbsp10:15 A M Medical Record Number: [id number] Patient Account Number: [id number] Date of Birth/Gender: Treating RN: 10/29/1970 (52 y.o. [name] Primary Care Physician: [name] Other Clinician: Referring Physician: Treating Physician/Extender: RO BSO [name], MICHA EL [place] in Treatment: 13 Education Assessment Education Provided To: Patient Education Topics Provided Wound/Skin Impairment: Handouts: Caring for Your Ulcer Methods: Explain/Verbal Responses: State content correctly Electronic Signature(s) Signed: 05/19/2023 1:18:15 PM By: [name] RN Entered By: [name] on 05/18/2023 10:27:55 -------------------------------------------------------------------------------- Wound Assessment Details Patient Name: Date of Service: [name], [name] 05/18/2023 10:15 A M Medical Record Number: [id number] Patient Account  more) []  - 0 Hypo/Hyperglycemic Management (do not check if billed separately) []  - 0 Ankle / Brachial Index (ABI) - do not check if billed separately Has the patient been seen at the hospital within the last three years: Yes Total Score: 0 Level Of Care: ____ Electronic Signature(s) Signed: 05/19/2023 1:18:15 PM By: [name] RN Entered By: [name] on 05/18/2023 11:14:55 -------------------------------------------------------------------------------- Compression Therapy Details Patient Name: Date of Service: [name], [name] 05/18/2023 10:15 A M Medical Record Number: [id number] Patient Account Number: [id number] Date of Birth/Sex: Treating RN: 13-Feb-1971 (52 y.o. [name] Primary Care Ashlei Chinchilla: [name] Other Clinician: Referring Kenyah Luba: Treating Kandee Escalante/Extender: Chauncey Mann, MICHA EL Yohannes, [place], Jered A ([id number]) [id number] Page 3 of 9 Weeks in Treatment: 13 Compression Therapy Performed for Wound Assessment: Wound #22 Left Lower Leg Performed By: Clinician [name], RN Compression Type: Double Layer Post Procedure Diagnosis Same as Pre-procedure Electronic Signature(s) Signed: 05/19/2023 1:18:15 PM By: [name] RN Entered By: [name] on 05/18/2023 11:14:47 -------------------------------------------------------------------------------- Encounter Discharge Information Details Patient Name: Date of Service: [name], [name] 05/18/2023 10:15 A M Medical Record Number: [id number] Patient Account Number: [id number] Date of Birth/Sex: Treating RN: 21-Jun-1971 (52 y.o. [name] Primary Care Darlette Dubow: [name] Other Clinician: Referring Taysom Glymph: Treating Derak Schurman/Extender: RO BSO [name], MICHA EL [place] in Treatment: 13 Encounter Discharge Information Items Discharge Condition:  Stable Ambulatory Status: Ambulatory Discharge Destination: Home Transportation: Private Auto Accompanied By: self Schedule Follow-up Appointment: Yes Clinical Summary of Care: Electronic Signature(s) Signed: 05/19/2023 1:18:15 PM By: [name] RN Entered By: [name] on 05/18/2023 11:15:39 -------------------------------------------------------------------------------- Lower Extremity Assessment Details Patient Name: Date of Service: [name], [name] 05/18/2023 10:15 A M Medical Record Number: [id number] Patient Account Number: [id number] Date of Birth/Sex: Treating RN: 05-Feb-1971 (52 y.o. [name] Primary Care Alahia Whicker: [name] Other Clinician: Referring Charlynn Salih: Treating Guinevere Stephenson/Extender: RO BSO N, MICHA EL [place] in Treatment: 13 Edema Assessment H[Left: OLT, Fuller A ([id number])] [Right: [id number] Page 4 of 9] Assessed: [Left: No] [Right: No] [Left: Edema] [Right: :] Calf Left: Right: Point of Measurement: 40 cm From Medial Instep 46 cm Ankle Left: Right: Point of Measurement: 10 cm From Medial Instep 43 cm Vascular Assessment Pulses: Dorsalis Pedis Palpable: [Left:Yes] Extremity colors, hair growth, and conditions: Extremity Color: [Left:Hyperpigmented] Hair Growth on Extremity: [Left:No] Temperature of Extremity: [Left:Warm] Capillary Refill: [Left:< 3 seconds] Dependent Rubor: [Left:No] Blanched when Elevated: [Left:No No] Toe Nail Assessment Left: Right: Thick: Yes Discolored: Yes Deformed: Yes Improper Length and Hygiene: Yes Electronic Signature(s) Signed: 05/19/2023 1:18:15 PM By: [name] RN Entered By: [name] on 05/18/2023 10:27:05 -------------------------------------------------------------------------------- Multi Wound Chart Details Patient Name: Date of Service: [name], [name] 05/18/2023 10:15 A M Medical Record Number: [id number] Patient Account Number:  [id number] Date of Birth/Sex: Treating RN: 08-09-1971 (52 y.o. [name] Primary Care Kevin Mario: [name] Other Clinician: Referring Arlee Bossard: Treating Andreka Stucki/Extender: RO BSO N, MICHA EL [place] in Treatment: 13 Vital Signs Height(in): 76 Pulse(bpm): 59 Weight(lbs): 400 Blood Pressure(mmHg): 140/67 Body Mass Index(BMI): 48.7 Temperature(F): 98 Respiratory Rate(breaths/min): 18 [22:Photos:] [N/A:N/A] Left Lower Leg N/A N/A Wound Location: Gradually Appeared N/A N/A Wounding Event: Diabetic Wound/Ulcer of the Lower N/A N/A Primary Etiology: Extremity Hypertension, Type II Diabetes, N/A N/A Comorbid History: Neuropathy 08/30/2022 N/A N/A Date Acquired: 13 N/A N/A Weeks of Treatment: Open N/A N/A Wound Status:  No N/A N/A Wound Recurrence: 2x2.5x0.1 N/A N/A Measurements L x W x D (cm) 3.927 N/A N/A A (cm) : rea 0.393 N/A N/A Volume (cm) : 95.70% N/A N/A % Reduction in A rea: 99.30% N/A N/A % Reduction in Volume: Grade 2 N/A N/A Classification: Medium N/A N/A Exudate A mount: Serosanguineous N/A N/A Exudate Type: red, brown N/A N/A Exudate Color: Large (67-100%) N/A N/A Granulation A mount: Red N/A N/A Granulation Quality: Small (1-33%) N/A N/A Necrotic A mount: Fat Layer (Subcutaneous Tissue): Yes N/A N/A Exposed Structures: Fascia: No Tendon: No Muscle: No Joint: No Bone: No None N/A N/A Epithelialization: Treatment Notes Electronic Signature(s) Signed: 05/19/2023 1:18:15 PM By: [name] RN Entered By: [name] on 05/18/2023 10:27:11 -------------------------------------------------------------------------------- Multi-Disciplinary Care Plan Details Patient Name: Date of Service: [name], [name] 05/18/2023 10:15 A M Medical Record Number: [id number] Patient Account Number: [id number] Date of Birth/Sex: Treating RN: 1970/09/22 (52 y.o. [name] Primary Care Jelan Batterton: [name] Other  Clinician: Referring Akeela Busk: Treating Jaimin Krupka/Extender: RO BSO N, MICHA EL [place] in Treatment: 13 Active Inactive Wound/Skin Impairment Nursing Diagnoses: Knowledge deficit related to ulceration/compromised skin integrity Goals: Patient/caregiver will verbalize understanding of skin care regimen Date Initiated: 03/09/2023 Date Inactivated: 04/13/2023 Target Resolution Date: 04/09/2023 Goal Status: Met Ulcer/skin breakdown will have a volume reduction of 30% by week 4 Date Initiated: 03/09/2023 Date Inactivated: 04/13/2023 Target Resolution Date: 04/09/2023 MATRIX, SCHWIND ([id number]) [id number] Page 6 of 9 Goal Status: Met Ulcer/skin breakdown will have a volume reduction of 50% by week 8 Date Initiated: 03/09/2023 Date Inactivated: 05/18/2023 Target Resolution Date: 05/10/2023 Goal Status: Met Ulcer/skin breakdown will have a volume reduction of 80% by week 12 Date Initiated: 03/09/2023 Target Resolution Date: 06/09/2023 Goal Status: Active Ulcer/skin breakdown will heal within 14 weeks Date Initiated: 03/09/2023 Target Resolution Date: 07/10/2023 Goal Status: Active Interventions: Assess patient/caregiver ability to obtain necessary supplies Assess patient/caregiver ability to perform ulcer/skin care regimen upon admission and as needed Assess ulceration(s) every visit Notes: Electronic Signature(s) Signed: 05/19/2023 1:18:15 PM By: [name] RN Entered By: [name] on 05/18/2023 10:27:40 -------------------------------------------------------------------------------- Pain Assessment Details Patient Name: Date of Service: [name], [name] 05/18/2023 10:15 A M Medical Record Number: [id number] Patient Account Number: [id number] Date of Birth/Sex: Treating RN: 1971/02/03 (52 y.o. [name] Primary Care Franciscojavier Wronski: [name] Other Clinician: Referring Nera Haworth: Treating Vignesh Willert/Extender: RO BSO N, MICHA EL  [place] in Treatment: 13 Active Problems Location of Pain Severity and Description of Pain Patient Has Paino No Site Locations Pain Management and Medication Current Pain Management: Electronic Signature(s) Signed: 05/19/2023 1:18:15 PM By: [name] RN Leonor Liv, Wai A ([id number]) [id number] Page 7 of 9 Entered By: [name] on 05/18/2023 10:20:18 -------------------------------------------------------------------------------- Patient/Caregiver Education Details Patient Name: Date of Service: [name], [name] 9/18/2024andnbsp10:15 A M Medical Record Number: [id number] Patient Account Number: [id number] Date of Birth/Gender: Treating RN: 10/29/1970 (52 y.o. [name] Primary Care Physician: [name] Other Clinician: Referring Physician: Treating Physician/Extender: RO BSO [name], MICHA EL [place] in Treatment: 13 Education Assessment Education Provided To: Patient Education Topics Provided Wound/Skin Impairment: Handouts: Caring for Your Ulcer Methods: Explain/Verbal Responses: State content correctly Electronic Signature(s) Signed: 05/19/2023 1:18:15 PM By: [name] RN Entered By: [name] on 05/18/2023 10:27:55 -------------------------------------------------------------------------------- Wound Assessment Details Patient Name: Date of Service: [name], [name] 05/18/2023 10:15 A M Medical Record Number: [id number] Patient Account  more) []  - 0 Hypo/Hyperglycemic Management (do not check if billed separately) []  - 0 Ankle / Brachial Index (ABI) - do not check if billed separately Has the patient been seen at the hospital within the last three years: Yes Total Score: 0 Level Of Care: ____ Electronic Signature(s) Signed: 05/19/2023 1:18:15 PM By: [name] RN Entered By: [name] on 05/18/2023 11:14:55 -------------------------------------------------------------------------------- Compression Therapy Details Patient Name: Date of Service: [name], [name] 05/18/2023 10:15 A M Medical Record Number: [id number] Patient Account Number: [id number] Date of Birth/Sex: Treating RN: 13-Feb-1971 (52 y.o. [name] Primary Care Ashlei Chinchilla: [name] Other Clinician: Referring Kenyah Luba: Treating Kandee Escalante/Extender: Chauncey Mann, MICHA EL Yohannes, [place], Jered A ([id number]) [id number] Page 3 of 9 Weeks in Treatment: 13 Compression Therapy Performed for Wound Assessment: Wound #22 Left Lower Leg Performed By: Clinician [name], RN Compression Type: Double Layer Post Procedure Diagnosis Same as Pre-procedure Electronic Signature(s) Signed: 05/19/2023 1:18:15 PM By: [name] RN Entered By: [name] on 05/18/2023 11:14:47 -------------------------------------------------------------------------------- Encounter Discharge Information Details Patient Name: Date of Service: [name], [name] 05/18/2023 10:15 A M Medical Record Number: [id number] Patient Account Number: [id number] Date of Birth/Sex: Treating RN: 21-Jun-1971 (52 y.o. [name] Primary Care Darlette Dubow: [name] Other Clinician: Referring Taysom Glymph: Treating Derak Schurman/Extender: RO BSO [name], MICHA EL [place] in Treatment: 13 Encounter Discharge Information Items Discharge Condition:  Stable Ambulatory Status: Ambulatory Discharge Destination: Home Transportation: Private Auto Accompanied By: self Schedule Follow-up Appointment: Yes Clinical Summary of Care: Electronic Signature(s) Signed: 05/19/2023 1:18:15 PM By: [name] RN Entered By: [name] on 05/18/2023 11:15:39 -------------------------------------------------------------------------------- Lower Extremity Assessment Details Patient Name: Date of Service: [name], [name] 05/18/2023 10:15 A M Medical Record Number: [id number] Patient Account Number: [id number] Date of Birth/Sex: Treating RN: 05-Feb-1971 (52 y.o. [name] Primary Care Alahia Whicker: [name] Other Clinician: Referring Charlynn Salih: Treating Guinevere Stephenson/Extender: RO BSO N, MICHA EL [place] in Treatment: 13 Edema Assessment H[Left: OLT, Fuller A ([id number])] [Right: [id number] Page 4 of 9] Assessed: [Left: No] [Right: No] [Left: Edema] [Right: :] Calf Left: Right: Point of Measurement: 40 cm From Medial Instep 46 cm Ankle Left: Right: Point of Measurement: 10 cm From Medial Instep 43 cm Vascular Assessment Pulses: Dorsalis Pedis Palpable: [Left:Yes] Extremity colors, hair growth, and conditions: Extremity Color: [Left:Hyperpigmented] Hair Growth on Extremity: [Left:No] Temperature of Extremity: [Left:Warm] Capillary Refill: [Left:< 3 seconds] Dependent Rubor: [Left:No] Blanched when Elevated: [Left:No No] Toe Nail Assessment Left: Right: Thick: Yes Discolored: Yes Deformed: Yes Improper Length and Hygiene: Yes Electronic Signature(s) Signed: 05/19/2023 1:18:15 PM By: [name] RN Entered By: [name] on 05/18/2023 10:27:05 -------------------------------------------------------------------------------- Multi Wound Chart Details Patient Name: Date of Service: [name], [name] 05/18/2023 10:15 A M Medical Record Number: [id number] Patient Account Number:  [id number] Date of Birth/Sex: Treating RN: 08-09-1971 (52 y.o. [name] Primary Care Kevin Mario: [name] Other Clinician: Referring Arlee Bossard: Treating Andreka Stucki/Extender: RO BSO N, MICHA EL [place] in Treatment: 13 Vital Signs Height(in): 76 Pulse(bpm): 59 Weight(lbs): 400 Blood Pressure(mmHg): 140/67 Body Mass Index(BMI): 48.7 Temperature(F): 98 Respiratory Rate(breaths/min): 18 [22:Photos:] [N/A:N/A] Left Lower Leg N/A N/A Wound Location: Gradually Appeared N/A N/A Wounding Event: Diabetic Wound/Ulcer of the Lower N/A N/A Primary Etiology: Extremity Hypertension, Type II Diabetes, N/A N/A Comorbid History: Neuropathy 08/30/2022 N/A N/A Date Acquired: 13 N/A N/A Weeks of Treatment: Open N/A N/A Wound Status:

## 2023-05-21 ENCOUNTER — Other Ambulatory Visit (INDEPENDENT_AMBULATORY_CARE_PROVIDER_SITE_OTHER): Payer: Self-pay | Admitting: Nurse Practitioner

## 2023-05-21 DIAGNOSIS — L97929 Non-pressure chronic ulcer of unspecified part of left lower leg with unspecified severity: Secondary | ICD-10-CM | POA: Diagnosis not present

## 2023-05-21 DIAGNOSIS — B954 Other streptococcus as the cause of diseases classified elsewhere: Secondary | ICD-10-CM | POA: Diagnosis not present

## 2023-05-21 DIAGNOSIS — I872 Venous insufficiency (chronic) (peripheral): Secondary | ICD-10-CM | POA: Diagnosis not present

## 2023-05-21 DIAGNOSIS — R7881 Bacteremia: Secondary | ICD-10-CM | POA: Diagnosis not present

## 2023-05-21 DIAGNOSIS — N186 End stage renal disease: Secondary | ICD-10-CM | POA: Diagnosis not present

## 2023-05-21 DIAGNOSIS — E1022 Type 1 diabetes mellitus with diabetic chronic kidney disease: Secondary | ICD-10-CM | POA: Diagnosis not present

## 2023-05-21 DIAGNOSIS — Z992 Dependence on renal dialysis: Secondary | ICD-10-CM | POA: Diagnosis not present

## 2023-05-21 DIAGNOSIS — J449 Chronic obstructive pulmonary disease, unspecified: Secondary | ICD-10-CM | POA: Diagnosis not present

## 2023-05-21 DIAGNOSIS — N2581 Secondary hyperparathyroidism of renal origin: Secondary | ICD-10-CM | POA: Diagnosis not present

## 2023-05-21 DIAGNOSIS — E1051 Type 1 diabetes mellitus with diabetic peripheral angiopathy without gangrene: Secondary | ICD-10-CM | POA: Diagnosis not present

## 2023-05-21 DIAGNOSIS — I120 Hypertensive chronic kidney disease with stage 5 chronic kidney disease or end stage renal disease: Secondary | ICD-10-CM | POA: Diagnosis not present

## 2023-05-23 DIAGNOSIS — B954 Other streptococcus as the cause of diseases classified elsewhere: Secondary | ICD-10-CM | POA: Diagnosis not present

## 2023-05-23 DIAGNOSIS — E1022 Type 1 diabetes mellitus with diabetic chronic kidney disease: Secondary | ICD-10-CM | POA: Diagnosis not present

## 2023-05-23 DIAGNOSIS — J449 Chronic obstructive pulmonary disease, unspecified: Secondary | ICD-10-CM | POA: Diagnosis not present

## 2023-05-23 DIAGNOSIS — N186 End stage renal disease: Secondary | ICD-10-CM | POA: Diagnosis not present

## 2023-05-23 DIAGNOSIS — I120 Hypertensive chronic kidney disease with stage 5 chronic kidney disease or end stage renal disease: Secondary | ICD-10-CM | POA: Diagnosis not present

## 2023-05-23 DIAGNOSIS — E1051 Type 1 diabetes mellitus with diabetic peripheral angiopathy without gangrene: Secondary | ICD-10-CM | POA: Diagnosis not present

## 2023-05-23 DIAGNOSIS — I872 Venous insufficiency (chronic) (peripheral): Secondary | ICD-10-CM | POA: Diagnosis not present

## 2023-05-23 DIAGNOSIS — L97929 Non-pressure chronic ulcer of unspecified part of left lower leg with unspecified severity: Secondary | ICD-10-CM | POA: Diagnosis not present

## 2023-05-23 DIAGNOSIS — R7881 Bacteremia: Secondary | ICD-10-CM | POA: Diagnosis not present

## 2023-05-24 DIAGNOSIS — R7881 Bacteremia: Secondary | ICD-10-CM | POA: Diagnosis not present

## 2023-05-24 DIAGNOSIS — N186 End stage renal disease: Secondary | ICD-10-CM | POA: Diagnosis not present

## 2023-05-24 DIAGNOSIS — Z79899 Other long term (current) drug therapy: Secondary | ICD-10-CM | POA: Diagnosis not present

## 2023-05-24 DIAGNOSIS — Z992 Dependence on renal dialysis: Secondary | ICD-10-CM | POA: Diagnosis not present

## 2023-05-24 DIAGNOSIS — N2581 Secondary hyperparathyroidism of renal origin: Secondary | ICD-10-CM | POA: Diagnosis not present

## 2023-05-24 DIAGNOSIS — L97929 Non-pressure chronic ulcer of unspecified part of left lower leg with unspecified severity: Secondary | ICD-10-CM | POA: Diagnosis not present

## 2023-05-25 ENCOUNTER — Encounter: Payer: Medicare HMO | Admitting: Internal Medicine

## 2023-05-25 DIAGNOSIS — E1022 Type 1 diabetes mellitus with diabetic chronic kidney disease: Secondary | ICD-10-CM | POA: Diagnosis not present

## 2023-05-25 DIAGNOSIS — E11622 Type 2 diabetes mellitus with other skin ulcer: Secondary | ICD-10-CM | POA: Diagnosis not present

## 2023-05-25 DIAGNOSIS — E10622 Type 1 diabetes mellitus with other skin ulcer: Secondary | ICD-10-CM | POA: Diagnosis not present

## 2023-05-25 DIAGNOSIS — L97822 Non-pressure chronic ulcer of other part of left lower leg with fat layer exposed: Secondary | ICD-10-CM | POA: Diagnosis not present

## 2023-05-25 DIAGNOSIS — Z792 Long term (current) use of antibiotics: Secondary | ICD-10-CM | POA: Diagnosis not present

## 2023-05-25 DIAGNOSIS — N186 End stage renal disease: Secondary | ICD-10-CM | POA: Diagnosis not present

## 2023-05-25 DIAGNOSIS — E1040 Type 1 diabetes mellitus with diabetic neuropathy, unspecified: Secondary | ICD-10-CM | POA: Diagnosis not present

## 2023-05-25 DIAGNOSIS — L97522 Non-pressure chronic ulcer of other part of left foot with fat layer exposed: Secondary | ICD-10-CM | POA: Diagnosis not present

## 2023-05-25 DIAGNOSIS — I890 Lymphedema, not elsewhere classified: Secondary | ICD-10-CM | POA: Diagnosis not present

## 2023-05-25 DIAGNOSIS — I120 Hypertensive chronic kidney disease with stage 5 chronic kidney disease or end stage renal disease: Secondary | ICD-10-CM | POA: Diagnosis not present

## 2023-05-26 DIAGNOSIS — Z992 Dependence on renal dialysis: Secondary | ICD-10-CM | POA: Diagnosis not present

## 2023-05-26 DIAGNOSIS — L97929 Non-pressure chronic ulcer of unspecified part of left lower leg with unspecified severity: Secondary | ICD-10-CM | POA: Diagnosis not present

## 2023-05-26 DIAGNOSIS — R7881 Bacteremia: Secondary | ICD-10-CM | POA: Diagnosis not present

## 2023-05-26 DIAGNOSIS — N2581 Secondary hyperparathyroidism of renal origin: Secondary | ICD-10-CM | POA: Diagnosis not present

## 2023-05-26 DIAGNOSIS — N186 End stage renal disease: Secondary | ICD-10-CM | POA: Diagnosis not present

## 2023-05-26 NOTE — Progress Notes (Signed)
rea: 99.50% % Reduction in Volume: Grade 2 Classification: Medium Exudate A mount: Serosanguineous Exudate Type: red, brown  Exudate Color: Large (67-100%) Granulation A mount: Red Granulation Quality: Small (1-33%) Necrotic A mount: Fat Layer (Subcutaneous Tissue): Yes N/A Exposed Structures: Fascia: No Tendon: No Muscle: No Joint: No Bone: No None Epithelialization:]  [N/A:N/A N/A N/A N/A N/A N/A N/A N/A N/A N/A N/A N/A N/A N/A N/A N/A N/A N/A N/A N/A N/A] Treatment Notes Electronic Signature(s) Signed: 05/26/2023 4:15:38 PM By: [name] RN Entered By: [name] on 05/25/2023 10:34:26 -------------------------------------------------------------------------------- Multi-Disciplinary Care Plan Details Patient Name: Date of Service: [name], [name] 05/25/2023 10:15 A M Medical Record Number: [id number] Patient Account Number: [id number] Date of Birth/Sex: Treating RN: Mar 21, 1971 (52 y.o. [name] Primary Care Yudith Norlander: [name] Other Clinician: Referring Markeis Allman: Treating Teyon Odette/Extender: RO BSO N, MICHA EL San Jetty in Treatment: 14 Active Inactive Wound/Skin Impairment Nursing Diagnoses: Knowledge deficit related to  ulceration/compromised skin integrity Goals: Patient/caregiver will verbalize understanding of skin care regimen Date Initiated: 03/09/2023 Date Inactivated: 04/13/2023 Target Resolution Date: 04/09/2023 Goal Status: Met Ulcer/skin breakdown will have a volume reduction of 30% by week 4 Date Initiated: 03/09/2023 Date Inactivated: 04/13/2023 Target Resolution Date: 04/09/2023 Goal Status: Met Ulcer/skin breakdown will have a volume reduction of 50% by week 8 Date Initiated: 03/09/2023 Date Inactivated: 05/18/2023 Target Resolution Date: 05/10/2023 Goal Status: Met Stacks, Gionni A ([id number]) [phone].pdf Page 6 of 9 Ulcer/skin breakdown will have a volume reduction of 80% by week 12 Date Initiated: 03/09/2023 Target Resolution Date: 06/09/2023 Goal Status: Active Ulcer/skin breakdown will heal within 14 weeks Date Initiated: 03/09/2023 Target Resolution Date: 07/10/2023 Goal Status: Active Interventions: Assess patient/caregiver ability to obtain necessary supplies Assess patient/caregiver ability to perform ulcer/skin care regimen upon admission and as needed Assess ulceration(s) every visit Notes: Electronic Signature(s) Signed: 05/26/2023 4:15:38 PM By: [name] RN Entered By: [name] on 05/25/2023 10:34:36 -------------------------------------------------------------------------------- Pain Assessment Details Patient Name: Date of Service: [name], [name] 05/25/2023 10:15 A M Medical Record Number: [id number] Patient Account Number: [id number] Date of Birth/Sex: Treating RN: [birth year] (52 y.o. [name] Primary Care Maryjean Corpening: [name] Other Clinician: Referring Javayah Magaw: Treating Chantae Soo/Extender: RO BSO N, MICHA EL San Jetty in Treatment: 14 Active Problems Location of Pain Severity and Description of Pain Patient Has Paino No Site Locations Pain Management and Medication Current Pain Management: Electronic  Signature(s) Signed: 05/26/2023 4:15:38 PM By: [name] RN Entered By: [name] on 05/25/2023 10:23:57 Denson, Kru A ([id number]) [id number] Page 7 of 9 -------------------------------------------------------------------------------- Patient/Caregiver Education Details Patient Name: Date of Service: [name], [name] 9/25/2024andnbsp10:15 A M Medical Record Number: [id number] Patient Account Number: [id number] Date of Birth/Gender: Treating RN: 12-10-1970 (52 y.o. [name]) [name] Primary Care Physician: [name] Other Clinician: Referring Physician: Treating Physician/Extender: RO BSO [name], MICHA EL San Jetty in Treatment: 14 Education Assessment Education Provided To: Patient Education Topics Provided Wound/Skin Impairment: Handouts: Caring for Your Ulcer Methods: Explain/Verbal Responses: State content correctly Electronic Signature(s) Signed: 05/26/2023 4:15:38 PM By: [name] RN Entered By: [name] on 05/25/2023 10:34:50 -------------------------------------------------------------------------------- Wound Assessment Details Patient Name: Date of Service: [name], [name] 05/25/2023 10:15 A M Medical Record Number: [id number] Patient Account Number: [id number] Date of Birth/Sex: Treating RN: Jun 17, 1971 (52 y.o. [name] Primary Care Varun Jourdan: [name] Other Clinician: Referring Andreu Drudge: Treating Shamikia Linskey/Extender: RO BSO N, MICHA EL Jerene Canny,  Malva Cogan in Treatment: 14 Wound Status Wound Number: 22 Primary Etiology: Diabetic Wound/Ulcer of the Lower Extremity Wound Location: Left Lower Leg Wound Status: Open Wounding Event: Gradually Appeared Comorbid History: Hypertension, Type II Diabetes, Neuropathy Date Acquired: 08/30/2022 Weeks Of Treatment: 14 Clustered Wound: No Photos Schoppe, Mayra A ([id number]) [phone].pdf Page 8 of 9 Wound Measurements Length: (cm)  2 Width: (cm) 1.7 Depth: (cm) 0.1 Area: (cm) 2.67 Volume: (cm) 0.267 % Reduction in Area: 97.1% % Reduction in Volume: 99.5% Epithelialization: None Tunneling: No Undermining: No Wound Description Classification: Grade 2 Exudate Amount: Medium Exudate Type: Serosanguineous Exudate Color: red, brown Foul Odor After Cleansing: No Slough/Fibrino Yes Wound Bed Granulation Amount: Large (67-100%) Exposed Structure Granulation Quality: Red Fascia Exposed: No Necrotic Amount: Small (1-33%) Fat Layer (Subcutaneous Tissue) Exposed: Yes Necrotic Quality: Adherent Slough Tendon Exposed: No Muscle Exposed: No Joint Exposed: No Bone Exposed: No Treatment Notes Wound #22 (Lower Leg) Wound Laterality: Left Cleanser Soap and Water Discharge Instruction: Gently cleanse wound with antibacterial soap, rinse and pat dry prior to dressing wounds Wound Cleanser Discharge Instruction: Wash your hands with soap and water. Remove old dressing, discard into plastic bag and place into trash. Cleanse the wound with Wound Cleanser prior to applying a clean dressing using gauze sponges, not tissues or cotton balls. Do not scrub or use excessive force. Pat dry using gauze sponges, not tissue or cotton balls. Peri-Wound Care Topical Primary Dressing Silvercel 4 1/4x 4 1/4 (in/in) Discharge Instruction: Apply Silvercel 4 1/4x 4 1/4 (in/in) as instructed Secondary Dressing Zetuvit Plus 4x4 (in/in) Secured With Stretch Net Dressing, Latex-free, Size 5, Small-Head / Shoulder / Thigh Discharge Instruction: over wrap for comfort Compression Wrap Urgo K2 Lite, two layer compression system, large Compression Stockings Add-Ons Electronic Signature(s) Signed: 05/26/2023 4:15:38 PM By: [name] RN Entered By: [name] on 05/25/2023 10:33:22 Haigler, Dick A ([id number]) [id number] Page 9 of  9 -------------------------------------------------------------------------------- Vitals Details Patient Name: Date of Service: [name], [name] 05/25/2023 10:15 A M Medical Record Number: [id number] Patient Account Number: [id number] Date of Birth/Sex: Treating RN: November 26, 1970 (52 y.o. [name]) [name] Primary Care Jenasis Straley: [name] Other Clinician: Referring Shekera Beavers: Treating Rhondalyn Clingan/Extender: RO BSO N, MICHA EL San Jetty in Treatment: 14 Vital Signs Time Taken: 10:23 Temperature (F): 98.1 Height (in): 76 Pulse (bpm): 61 Weight (lbs): 400 Respiratory Rate (breaths/min): 18 Body Mass Index (BMI): 48.7 Blood Pressure (mmHg): 158/79 Reference Range: 80 - 120 mg / dl Electronic Signature(s) Signed: 05/26/2023 4:15:38 PM By: [name] RN Entered By: [name] on 05/25/2023 10:23:44  JERAMYAH, SHILLINGTON A ([id number]) [id number] Page 1 of 9 Visit Report for 05/25/2023 Arrival Information Details Patient Name: Date of Service: [name], CO [place] A. 05/25/2023 10:15 A M Medical Record Number: [id number] Patient Account Number: [id number] Date of Birth/Sex: Treating RN: September 13, 1970 (52 y.o. [name]) [name] Primary Care Qadir Folks: [name] Other Clinician: Referring Kwana Ringel: Treating Fahd Galea/Extender: RO BSO [name], MICHA EL San Jetty in Treatment: 14 Visit Information History Since Last Visit Added or deleted any medications: No Patient Arrived: Ambulatory Any new allergies or adverse reactions: No Arrival Time: 10:23 Had a fall or experienced change in No Accompanied By: self activities of daily living that may affect Transfer Assistance: None risk of falls: Patient Identification Verified: Yes Signs or symptoms of abuse/neglect since last visito No Secondary Verification Process Completed: Yes Hospitalized since last visit: No Patient Requires Transmission-Based Precautions: No Implantable device outside of the clinic excluding No Patient Has Alerts: Yes cellular tissue based products placed in the center Patient Alerts: Patient on Blood Thinner since last visit: ABI R [place] TBI .61 10/11/22 Has Dressing in Place as Prescribed: Yes ABI L [place] TBI .33 10/11/22 Pain Present Now: No Electronic Signature(s) Signed: 05/26/2023 4:15:38 PM By: [name] RN Entered By: [name] on 05/25/2023 10:23:24 -------------------------------------------------------------------------------- Clinic Level of Care Assessment Details Patient Name: Date of Service: [name], [name] 05/25/2023 10:15 A M Medical Record Number: [id number] Patient Account Number: [id number] Date of Birth/Sex: Treating RN: 11-10-1970 (52 y.o. [name] Primary Care Kaoir Loree: [name] Other Clinician: Referring Ninette Cotta: Treating Shaydon Lease/Extender: RO BSO  N, MICHA EL San Jetty in Treatment: 14 Clinic Level of Care Assessment Items TOOL 1 Quantity Score []  - 0 Use when EandM and Procedure is performed on INITIAL visit ASSESSMENTS - Nursing Assessment / Reassessment []  - 0 General Physical Exam (combine w/ comprehensive assessment (listed just below) when performed on new pt. evals) []  - 0 Comprehensive Assessment (HX, ROS, Risk Assessments, Wounds Hx, etc.) Schreiner, Quantavius A ([id number]) [id number] Page 2 of 9 ASSESSMENTS - Wound and Skin Assessment / Reassessment []  - 0 Dermatologic / Skin Assessment (not related to wound area) ASSESSMENTS - Ostomy and/or Continence Assessment and Care []  - 0 Incontinence Assessment and Management []  - 0 Ostomy Care Assessment and Management (repouching, etc.) PROCESS - Coordination of Care []  - 0 Simple Patient / Family Education for ongoing care []  - 0 Complex (extensive) Patient / Family Education for ongoing care []  - 0 Staff obtains Chiropractor, Records, T Results / Process Orders est []  - 0 Staff telephones HHA, Nursing Homes / Clarify orders / etc []  - 0 Routine Transfer to another Facility (non-emergent condition) []  - 0 Routine Hospital Admission (non-emergent condition) []  - 0 New Admissions / Manufacturing engineer / Ordering NPWT Apligraf, etc. , []  - 0 Emergency Hospital Admission (emergent condition) PROCESS - Special Needs []  - 0 Pediatric / Minor Patient Management []  - 0 Isolation Patient Management []  - 0 Hearing / Language / Visual special needs []  - 0 Assessment of Community assistance (transportation, D/C planning, etc.) []  - 0 Additional assistance / Altered mentation []  - 0 Support Surface(s) Assessment (bed, cushion, seat, etc.) INTERVENTIONS - Miscellaneous []  - 0 External ear exam []  - 0 Patient Transfer (multiple staff / Nurse, adult / Similar devices) []  - 0 Simple Staple / Suture removal (25 or less) []  -  0 Complex Staple / Suture removal (26 or more) []  - 0 Hypo/Hyperglycemic Management (do  JERAMYAH, SHILLINGTON A ([id number]) [id number] Page 1 of 9 Visit Report for 05/25/2023 Arrival Information Details Patient Name: Date of Service: [name], CO [place] A. 05/25/2023 10:15 A M Medical Record Number: [id number] Patient Account Number: [id number] Date of Birth/Sex: Treating RN: September 13, 1970 (52 y.o. [name]) [name] Primary Care Qadir Folks: [name] Other Clinician: Referring Kwana Ringel: Treating Fahd Galea/Extender: RO BSO [name], MICHA EL San Jet[place] in Treatment: 14 Visit Information History Since Last Visit Added or deleted any medications: No Patient Arrived: Ambulatory Any new allergies or adverse reactions: No Arrival Time: 10:23 Had a fall or experienced change in No Accompanied By: self activities of daily living that may affect Transfer Assistance: None risk of falls: Patient Identification Verified: Yes Signs or symptoms of abuse/neglect since last visito No Secondary Verification Process Completed: Yes Hospitalized since last visit: No Patient Requires Transmission-Based Precautions: No Implantable device outside of the clinic excluding No Patient Has Alerts: Yes cellular tissue based products placed in the center Patient Alerts: Patient on Blood Thinner since last visit: ABI R [place] TBI .61 10/11/22 Has Dressing in Place as Prescribed: Yes ABI L [place] TBI .33 10/11/22 Pain Present Now: No Electronic Signature(s) Signed: 05/26/2023 4:15:38 PM By: [name] RN Entered By: [name] on 05/25/2023 10:23:24 -------------------------------------------------------------------------------- Clinic Level of Care Assessment Details Patient Name: Date of Service: [name], [name] 05/25/2023 10:15 A M Medical Record Number: [id number] Patient Account Number: [id number] Date of Birth/Sex: Treating RN: 11-10-1970 (52 y.o. [name] Primary Care Kaoir Loree: [name] Other Clinician: Referring Ninette Cotta: Treating Shaydon Lease/Extender: RO BSO  N, MICHA EL [place] in Treatment: 14 Clinic Level of Care Assessment Items TOOL 1 Quantity Score []  - 0 Use when EandM and Procedure is performed on INITIAL visit ASSESSMENTS - Nursing Assessment / Reassessment []  - 0 General Physical Exam (combine w/ comprehensive assessment (listed just below) when performed on new pt. evals) []  - 0 Comprehensive Assessment (HX, ROS, Risk Assessments, Wounds Hx, etc.) Schreiner, Quantavius A ([id number]) [id number] Page 2 of 9 ASSESSMENTS - Wound and Skin Assessment / Reassessment []  - 0 Dermatologic / Skin Assessment (not related to wound area) ASSESSMENTS - Ostomy and/or Continence Assessment and Care []  - 0 Incontinence Assessment and Management []  - 0 Ostomy Care Assessment and Management (repouching, etc.) PROCESS - Coordination of Care []  - 0 Simple Patient / Family Education for ongoing care []  - 0 Complex (extensive) Patient / Family Education for ongoing care []  - 0 Staff obtains Chiropractor, Records, T Results / Process Orders est []  - 0 Staff telephones HHA, Nursing Homes / Clarify orders / etc []  - 0 Routine Transfer to another Facility (non-emergent condition) []  - 0 Routine Hospital Admission (non-emergent condition) []  - 0 New Admissions / Manufacturing engineer / Ordering NPWT Apligraf, etc. , []  - 0 Emergency Hospital Admission (emergent condition) PROCESS - Special Needs []  - 0 Pediatric / Minor Patient Management []  - 0 Isolation Patient Management []  - 0 Hearing / Language / Visual special needs []  - 0 Assessment of Community assistance (transportation, D/C planning, etc.) []  - 0 Additional assistance / Altered mentation []  - 0 Support Surface(s) Assessment (bed, cushion, seat, etc.) INTERVENTIONS - Miscellaneous []  - 0 External ear exam []  - 0 Patient Transfer (multiple staff / Nurse, adult / Similar devices) []  - 0 Simple Staple / Suture removal (25 or less) []  -  0 Complex Staple / Suture removal (26 or more) []  - 0 Hypo/Hyperglycemic Management (do

## 2023-05-27 ENCOUNTER — Ambulatory Visit: Payer: Self-pay | Admitting: *Deleted

## 2023-05-27 DIAGNOSIS — E1051 Type 1 diabetes mellitus with diabetic peripheral angiopathy without gangrene: Secondary | ICD-10-CM | POA: Diagnosis not present

## 2023-05-27 DIAGNOSIS — I872 Venous insufficiency (chronic) (peripheral): Secondary | ICD-10-CM | POA: Diagnosis not present

## 2023-05-27 DIAGNOSIS — I120 Hypertensive chronic kidney disease with stage 5 chronic kidney disease or end stage renal disease: Secondary | ICD-10-CM | POA: Diagnosis not present

## 2023-05-27 DIAGNOSIS — B954 Other streptococcus as the cause of diseases classified elsewhere: Secondary | ICD-10-CM | POA: Diagnosis not present

## 2023-05-27 DIAGNOSIS — N186 End stage renal disease: Secondary | ICD-10-CM | POA: Diagnosis not present

## 2023-05-27 DIAGNOSIS — J449 Chronic obstructive pulmonary disease, unspecified: Secondary | ICD-10-CM | POA: Diagnosis not present

## 2023-05-27 DIAGNOSIS — E1022 Type 1 diabetes mellitus with diabetic chronic kidney disease: Secondary | ICD-10-CM | POA: Diagnosis not present

## 2023-05-27 DIAGNOSIS — R7881 Bacteremia: Secondary | ICD-10-CM | POA: Diagnosis not present

## 2023-05-27 DIAGNOSIS — L97929 Non-pressure chronic ulcer of unspecified part of left lower leg with unspecified severity: Secondary | ICD-10-CM | POA: Diagnosis not present

## 2023-05-27 NOTE — Patient Outreach (Signed)
Care Coordination   Follow Up Visit Note   05/27/2023 Name: [name] MRN: [id number] DOB: September 01, 1970  [name] is a 52 y.o. year old male who sees [name], [name], MD for primary care. I spoke with  [name] by phone today.  What matters to the patients health and wellness today?  Report wounds are much better, healing properly.  Continues to work to decrease blood sugars.  Denies any urgent concerns, encouraged to contact this care manager with questions.     Goals Addressed             This Visit's Progress    Effective management of DM   On track    Care Coordination Interventions: Provided education to patient about basic DM disease process Reviewed medications with patient and discussed importance of medication adherence Counseled on importance of regular laboratory monitoring as prescribed Advised patient, providing education and rationale, to check cbg daily and record, calling provider for findings outside established parameters Report he is adherent to HD on Tuesdays, Thursdays, and Saturdays       Effective wound healing of leg wound   On track    Care Coordination Interventions: Evaluation of current treatment plan related to leg wound and patient's adherence to plan as established by provider Advised patient to call [facility] agency to discuss concerns for treatment (regarding availability of supplies) Reviewed scheduled/upcoming provider appointments including HH wound care nursing visit on Mondays and Fridays, wound care office visits every Wednesday Discussed plans with patient for ongoing care management follow up and provided patient with direct contact information for care management team         SDOH assessments and interventions completed:  No     Care Coordination Interventions:  Yes, provided   Interventions Today    Flowsheet Row Most Recent Value  Chronic Disease   Chronic disease during today's visit Diabetes, Other  General  Interventions   General Interventions Discussed/Reviewed General Interventions Reviewed, Annual Foot Exam, Doctor Visits, Durable Medical Equipment (DME)  Doctor Visits Discussed/Reviewed Doctor Visits Reviewed, PCP, Specialist  [[facility] and Friday, wound clinic on Wednesday.  Vascular, PCP and urology on 10/28]  Durable Medical Equipment (DME) Glucomoter  PCP/Specialist Visits Compliance with follow-up visit  Education Interventions   Education Provided Provided Education  Provided Verbal Education On Nutrition, Foot Care, Labs, Blood Sugar Monitoring, When to see the doctor, Medication  Labs Reviewed Hgb A1c  Nutrition Interventions   Nutrition Discussed/Reviewed Nutrition Reviewed, Adding fruits and vegetables, Portion sizes, Decreasing sugar intake       Follow up plan: Follow up call scheduled for 10/29    Encounter Outcome:  Patient Visit Completed   Kemper Durie, RN, MSN, [facility] Care Management Care Management Coordinator [phone]

## 2023-05-28 DIAGNOSIS — L97929 Non-pressure chronic ulcer of unspecified part of left lower leg with unspecified severity: Secondary | ICD-10-CM | POA: Diagnosis not present

## 2023-05-28 DIAGNOSIS — N2581 Secondary hyperparathyroidism of renal origin: Secondary | ICD-10-CM | POA: Diagnosis not present

## 2023-05-28 DIAGNOSIS — R7881 Bacteremia: Secondary | ICD-10-CM | POA: Diagnosis not present

## 2023-05-28 DIAGNOSIS — Z992 Dependence on renal dialysis: Secondary | ICD-10-CM | POA: Diagnosis not present

## 2023-05-28 DIAGNOSIS — N186 End stage renal disease: Secondary | ICD-10-CM | POA: Diagnosis not present

## 2023-05-30 DIAGNOSIS — J449 Chronic obstructive pulmonary disease, unspecified: Secondary | ICD-10-CM | POA: Diagnosis not present

## 2023-05-30 DIAGNOSIS — N186 End stage renal disease: Secondary | ICD-10-CM | POA: Diagnosis not present

## 2023-05-30 DIAGNOSIS — B954 Other streptococcus as the cause of diseases classified elsewhere: Secondary | ICD-10-CM | POA: Diagnosis not present

## 2023-05-30 DIAGNOSIS — L97929 Non-pressure chronic ulcer of unspecified part of left lower leg with unspecified severity: Secondary | ICD-10-CM | POA: Diagnosis not present

## 2023-05-30 DIAGNOSIS — I120 Hypertensive chronic kidney disease with stage 5 chronic kidney disease or end stage renal disease: Secondary | ICD-10-CM | POA: Diagnosis not present

## 2023-05-30 DIAGNOSIS — E1051 Type 1 diabetes mellitus with diabetic peripheral angiopathy without gangrene: Secondary | ICD-10-CM | POA: Diagnosis not present

## 2023-05-30 DIAGNOSIS — R7881 Bacteremia: Secondary | ICD-10-CM | POA: Diagnosis not present

## 2023-05-30 DIAGNOSIS — I872 Venous insufficiency (chronic) (peripheral): Secondary | ICD-10-CM | POA: Diagnosis not present

## 2023-05-30 DIAGNOSIS — E1022 Type 1 diabetes mellitus with diabetic chronic kidney disease: Secondary | ICD-10-CM | POA: Diagnosis not present

## 2023-05-30 DIAGNOSIS — Z992 Dependence on renal dialysis: Secondary | ICD-10-CM | POA: Diagnosis not present

## 2023-05-31 DIAGNOSIS — Z992 Dependence on renal dialysis: Secondary | ICD-10-CM | POA: Diagnosis not present

## 2023-05-31 DIAGNOSIS — N2581 Secondary hyperparathyroidism of renal origin: Secondary | ICD-10-CM | POA: Diagnosis not present

## 2023-05-31 DIAGNOSIS — N186 End stage renal disease: Secondary | ICD-10-CM | POA: Diagnosis not present

## 2023-06-01 ENCOUNTER — Encounter: Payer: Medicare HMO | Attending: Internal Medicine | Admitting: Internal Medicine

## 2023-06-01 DIAGNOSIS — Z992 Dependence on renal dialysis: Secondary | ICD-10-CM | POA: Insufficient documentation

## 2023-06-01 DIAGNOSIS — I120 Hypertensive chronic kidney disease with stage 5 chronic kidney disease or end stage renal disease: Secondary | ICD-10-CM | POA: Insufficient documentation

## 2023-06-01 DIAGNOSIS — L97822 Non-pressure chronic ulcer of other part of left lower leg with fat layer exposed: Secondary | ICD-10-CM | POA: Insufficient documentation

## 2023-06-01 DIAGNOSIS — E11622 Type 2 diabetes mellitus with other skin ulcer: Secondary | ICD-10-CM | POA: Diagnosis not present

## 2023-06-01 DIAGNOSIS — I890 Lymphedema, not elsewhere classified: Secondary | ICD-10-CM | POA: Insufficient documentation

## 2023-06-01 DIAGNOSIS — N186 End stage renal disease: Secondary | ICD-10-CM | POA: Diagnosis not present

## 2023-06-01 DIAGNOSIS — Z794 Long term (current) use of insulin: Secondary | ICD-10-CM | POA: Insufficient documentation

## 2023-06-01 DIAGNOSIS — Z792 Long term (current) use of antibiotics: Secondary | ICD-10-CM | POA: Insufficient documentation

## 2023-06-01 DIAGNOSIS — E1122 Type 2 diabetes mellitus with diabetic chronic kidney disease: Secondary | ICD-10-CM | POA: Insufficient documentation

## 2023-06-02 DIAGNOSIS — N186 End stage renal disease: Secondary | ICD-10-CM | POA: Diagnosis not present

## 2023-06-02 DIAGNOSIS — N2581 Secondary hyperparathyroidism of renal origin: Secondary | ICD-10-CM | POA: Diagnosis not present

## 2023-06-02 DIAGNOSIS — Z992 Dependence on renal dialysis: Secondary | ICD-10-CM | POA: Diagnosis not present

## 2023-06-03 DIAGNOSIS — J449 Chronic obstructive pulmonary disease, unspecified: Secondary | ICD-10-CM | POA: Diagnosis not present

## 2023-06-03 DIAGNOSIS — N186 End stage renal disease: Secondary | ICD-10-CM | POA: Diagnosis not present

## 2023-06-03 DIAGNOSIS — E1051 Type 1 diabetes mellitus with diabetic peripheral angiopathy without gangrene: Secondary | ICD-10-CM | POA: Diagnosis not present

## 2023-06-03 DIAGNOSIS — L97929 Non-pressure chronic ulcer of unspecified part of left lower leg with unspecified severity: Secondary | ICD-10-CM | POA: Diagnosis not present

## 2023-06-03 DIAGNOSIS — E1022 Type 1 diabetes mellitus with diabetic chronic kidney disease: Secondary | ICD-10-CM | POA: Diagnosis not present

## 2023-06-03 DIAGNOSIS — I120 Hypertensive chronic kidney disease with stage 5 chronic kidney disease or end stage renal disease: Secondary | ICD-10-CM | POA: Diagnosis not present

## 2023-06-03 DIAGNOSIS — B954 Other streptococcus as the cause of diseases classified elsewhere: Secondary | ICD-10-CM | POA: Diagnosis not present

## 2023-06-03 DIAGNOSIS — R7881 Bacteremia: Secondary | ICD-10-CM | POA: Diagnosis not present

## 2023-06-03 DIAGNOSIS — I872 Venous insufficiency (chronic) (peripheral): Secondary | ICD-10-CM | POA: Diagnosis not present

## 2023-06-04 DIAGNOSIS — N2581 Secondary hyperparathyroidism of renal origin: Secondary | ICD-10-CM | POA: Diagnosis not present

## 2023-06-04 DIAGNOSIS — Z992 Dependence on renal dialysis: Secondary | ICD-10-CM | POA: Diagnosis not present

## 2023-06-04 DIAGNOSIS — N186 End stage renal disease: Secondary | ICD-10-CM | POA: Diagnosis not present

## 2023-06-07 DIAGNOSIS — N2581 Secondary hyperparathyroidism of renal origin: Secondary | ICD-10-CM | POA: Diagnosis not present

## 2023-06-07 DIAGNOSIS — N186 End stage renal disease: Secondary | ICD-10-CM | POA: Diagnosis not present

## 2023-06-07 DIAGNOSIS — Z992 Dependence on renal dialysis: Secondary | ICD-10-CM | POA: Diagnosis not present

## 2023-06-08 ENCOUNTER — Encounter: Payer: Medicare HMO | Admitting: Internal Medicine

## 2023-06-08 DIAGNOSIS — I890 Lymphedema, not elsewhere classified: Secondary | ICD-10-CM | POA: Diagnosis not present

## 2023-06-08 DIAGNOSIS — N186 End stage renal disease: Secondary | ICD-10-CM | POA: Diagnosis not present

## 2023-06-08 DIAGNOSIS — Z794 Long term (current) use of insulin: Secondary | ICD-10-CM | POA: Diagnosis not present

## 2023-06-08 DIAGNOSIS — Z792 Long term (current) use of antibiotics: Secondary | ICD-10-CM | POA: Diagnosis not present

## 2023-06-08 DIAGNOSIS — E1122 Type 2 diabetes mellitus with diabetic chronic kidney disease: Secondary | ICD-10-CM | POA: Diagnosis not present

## 2023-06-08 DIAGNOSIS — L97822 Non-pressure chronic ulcer of other part of left lower leg with fat layer exposed: Secondary | ICD-10-CM | POA: Diagnosis not present

## 2023-06-08 DIAGNOSIS — E11622 Type 2 diabetes mellitus with other skin ulcer: Secondary | ICD-10-CM | POA: Diagnosis not present

## 2023-06-08 DIAGNOSIS — I120 Hypertensive chronic kidney disease with stage 5 chronic kidney disease or end stage renal disease: Secondary | ICD-10-CM | POA: Diagnosis not present

## 2023-06-08 DIAGNOSIS — Z992 Dependence on renal dialysis: Secondary | ICD-10-CM | POA: Diagnosis not present

## 2023-06-09 DIAGNOSIS — N2581 Secondary hyperparathyroidism of renal origin: Secondary | ICD-10-CM | POA: Diagnosis not present

## 2023-06-09 DIAGNOSIS — N186 End stage renal disease: Secondary | ICD-10-CM | POA: Diagnosis not present

## 2023-06-09 DIAGNOSIS — Z992 Dependence on renal dialysis: Secondary | ICD-10-CM | POA: Diagnosis not present

## 2023-06-09 NOTE — Progress Notes (Signed)
[name], [name] ([id number]) [id number] Page 1 of 11 Visit Report for 02/16/2023 Chief Complaint Document Details Patient Name: Date of Service: [name] LT, [name] 02/16/2023 8:45 [name] Medical Record Number: [id number] Patient Account Number: [id number] Date of Birth/Sex: Treating RN: 12-04-1970 (52 y.o. [name]) [name] Primary Care Provider: Maudie [name] Other Clinician: Referring Provider: Treating Provider/Extender: [name] in Treatment: 0 Information Obtained from: Patient Chief Complaint 01/20/22; wound to previous central line catheter to the right side of the neck 02/16/2023; ED referral for right lower extremity wound Electronic Signature(s) Signed: 02/17/2023 12:49:28 PM By: [name] Entered By: [name] on 02/16/2023 10:51:14 -------------------------------------------------------------------------------- HPI Details Patient Name: Date of Service: [name] 02/16/2023 8:45 [name] Medical Record Number: [id number] Patient Account Number: [id number] Date of Birth/Sex: Treating RN: 1971/07/25 (52 y.o. [name] Primary Care Provider: Maudie [name] Other Clinician: Referring Provider: Treating Provider/Extender: [name] in Treatment: 0 History of Present Illness HPI Description: 12/28/17; this is [name] now 52 year old man who is [name] type II diabetic. He was hospitalized from 10/01/17 through 10/19/17. He had an MSSA soft tissue and skin infection. 2 open areas on the left leg were identified he has [name] smaller area on the left medial calf superiorly just below the knee and [name] wound just above the left ankle on the posterior medial aspect. I think both of these were surgical IandD sites when he was in the hospital. He was discharged with [name] wound VAC at that point however this is since been taken off. He follows with [name] for the [facility] and he is still on chronic  Keflex at 500 twice [name] day. At that time he was hospitalized his hemoglobin A1c was 15.1 however if I'm reading his endocrinologist notes correctly that is improved. He has been following with [name] at vein and vascular and he has been applying calcium alginate and Unna boots. He has home health changing the dressing. They have also been attempting to get him external compression pumps although the patient is unaware whether they've been approved by insurance at this point. as mentioned he has [name] smaller clean wound on the right lateral calf just below the knee and he has [name] much larger area just above the left ankle medially and posteriorly. Our intake nurse reported greenish purulent looking drainage.the patient did have surgical material sent to pathology in February. This showed chronic abscess The patient also has lymphedema stage III in the left greater than right lower extremities. He has [name] history of blisters with wounds but these of all were always healed. The patient thinks that the lymphedema may have been present since he was about 52 years old i.e. about 30 years. He does not have graded pressure stockings and has not worn stockings. He does not have [name] distant history of DVT PE or phlebitis. He has not been systemically unwell fever no chills. [name], [name] ([id number]) [id number] Page 2 of 11 He states that his Lasix is recently been reduced. He tells me his kidney function is at "30%" and he has been followed by [name] of nephrology. At one point he was on Lasix 80 twice [name] day however that's been cut back and he is now on Lasix at 20 twice [name] day. The patient has [name] history of PAD listed in his records although he comes from [name] I don't think is felt to have significant PAD. ABIs  x 0.3cm depth; 4.32cm^2 area and 1.296cm^3 volume. There is Fat Layer (Subcutaneous Tissue) exposed. There is no tunneling or undermining noted. There is [name] medium amount of serosanguineous drainage noted. There is medium (34-66%) red granulation within the wound bed. There is [name] medium  (34-66%) amount of necrotic tissue within the wound bed including Adherent Slough. Wound #22 status is Open. Original cause of wound was Gradually Appeared. The date acquired was: 08/30/2022. The wound is located on the Left Lower Leg. The wound measures 11cm length x 10.5cm width x 0.6cm depth; 90.713cm^2 area and 54.428cm^3 volume. There is Fat Layer (Subcutaneous Tissue) exposed. There is no tunneling or undermining noted. There is [name] medium amount of serosanguineous drainage noted. There is large (67-100%) red granulation within the wound bed. There is [name] small (1-33%) amount of necrotic tissue within the wound bed including Adherent Slough. Assessment Active Problems ICD-10 Non-pressure chronic ulcer of other part of left lower leg with fat layer exposed Chronic venous hypertension (idiopathic) with ulcer of left lower extremity Chronic venous hypertension (idiopathic) with inflammation of right lower extremity Type 2 diabetes mellitus with other skin ulcer Patient was referred to our clinic by the ED for follow-up of his right lower extremity wounds for which have healed. We gave him information to order compression stockings. He is being managed by vein and vascular for his left lower extremity wounds. He would like to continue following with them. He may follow-up with [place] as needed. Plan 1. Follow-up as needed Electronic Signature(s) Signed: 02/17/2023 12:49:28 PM By: [name] Entered By: [name] on 02/16/2023 11:19:35 -------------------------------------------------------------------------------- ROS/PFSH Details Patient Name: Date of Service: [name] 02/16/2023 8:45 [name] Medical Record Number: [id number] Patient Account Number: [id number] Date of Birth/Sex: Treating RN: 07/07/1971 (52 y.o. [name] Primary Care Provider: Maudie [name] Other Clinician: Referring Provider: Treating Provider/Extender: [name] in  Treatment: 0 Information Obtained From Patient Cardiovascular Medical History: Positive for: Hypertension Endocrine Medical History: Positive for: Type II Diabetes Time with diabetes: 15 yrs Treated with: Insulin, Oral agents [name], [name] ([id number]) [id number] Page 10 of 11 Blood sugar tested every day: Yes Tested : Neurologic Medical History: Positive for: Neuropathy Immunizations Pneumococcal Vaccine: Received Pneumococcal Vaccination: Yes Received Pneumococcal Vaccination On or After 60th Birthday: No Implantable Devices No devices added Family and Social History Cancer: Yes - Maternal Grandparents; Diabetes: Yes - Father,Child; Heart Disease: No; Hereditary Spherocytosis: No; Hypertension: Yes - Paternal Grandparents; Kidney Disease: Yes - Father; Lung Disease: Yes - Maternal Grandparents; Seizures: No; Stroke: No; Thyroid Problems: No; Tuberculosis: No; Never smoker; Marital Status - Separated; Alcohol Use: Rarely; Drug Use: No History; Caffeine Use: Rarely; Financial Concerns: No; Food, Clothing or Shelter Needs: No; Support System Lacking: No; Transportation Concerns: No Electronic Signature(s) Signed: 02/17/2023 12:49:28 PM By: [name] Signed: 02/17/2023 12:58:51 PM By: [name] RN Entered By: [name] on 02/16/2023 09:18:51 -------------------------------------------------------------------------------- SuperBill Details Patient Name: Date of Service: [name] 02/16/2023 Medical Record Number: [id number] Patient Account Number: [id number] Date of Birth/Sex: Treating RN: 1971/01/10 (52 y.o. [name] Primary Care Provider: Maudie [name] Other Clinician: Referring Provider: Treating Provider/Extender: [name] in Treatment: 0 Diagnosis Coding ICD-10 Codes Code Description [phone] Non-pressure chronic ulcer of other part of left lower leg with fat layer exposed I87.312  Chronic venous hypertension (idiopathic) with ulcer of left lower extremity I87.321 Chronic venous hypertension (idiopathic) with inflammation of right  x 0.3cm depth; 4.32cm^2 area and 1.296cm^3 volume. There is Fat Layer (Subcutaneous Tissue) exposed. There is no tunneling or undermining noted. There is [name] medium amount of serosanguineous drainage noted. There is medium (34-66%) red granulation within the wound bed. There is [name] medium  (34-66%) amount of necrotic tissue within the wound bed including Adherent Slough. Wound #22 status is Open. Original cause of wound was Gradually Appeared. The date acquired was: 08/30/2022. The wound is located on the Left Lower Leg. The wound measures 11cm length x 10.5cm width x 0.6cm depth; 90.713cm^2 area and 54.428cm^3 volume. There is Fat Layer (Subcutaneous Tissue) exposed. There is no tunneling or undermining noted. There is [name] medium amount of serosanguineous drainage noted. There is large (67-100%) red granulation within the wound bed. There is [name] small (1-33%) amount of necrotic tissue within the wound bed including Adherent Slough. Assessment Active Problems ICD-10 Non-pressure chronic ulcer of other part of left lower leg with fat layer exposed Chronic venous hypertension (idiopathic) with ulcer of left lower extremity Chronic venous hypertension (idiopathic) with inflammation of right lower extremity Type 2 diabetes mellitus with other skin ulcer Patient was referred to our clinic by the ED for follow-up of his right lower extremity wounds for which have healed. We gave him information to order compression stockings. He is being managed by vein and vascular for his left lower extremity wounds. He would like to continue following with them. He may follow-up with [place] as needed. Plan 1. Follow-up as needed Electronic Signature(s) Signed: 02/17/2023 12:49:28 PM By: [name] Entered By: [name] on 02/16/2023 11:19:35 -------------------------------------------------------------------------------- ROS/PFSH Details Patient Name: Date of Service: [name] 02/16/2023 8:45 [name] Medical Record Number: [id number] Patient Account Number: [id number] Date of Birth/Sex: Treating RN: 07/07/1971 (52 y.o. [name] Primary Care Provider: Maudie [name] Other Clinician: Referring Provider: Treating Provider/Extender: [name] in  Treatment: 0 Information Obtained From Patient Cardiovascular Medical History: Positive for: Hypertension Endocrine Medical History: Positive for: Type II Diabetes Time with diabetes: 15 yrs Treated with: Insulin, Oral agents [name], [name] ([id number]) [id number] Page 10 of 11 Blood sugar tested every day: Yes Tested : Neurologic Medical History: Positive for: Neuropathy Immunizations Pneumococcal Vaccine: Received Pneumococcal Vaccination: Yes Received Pneumococcal Vaccination On or After 60th Birthday: No Implantable Devices No devices added Family and Social History Cancer: Yes - Maternal Grandparents; Diabetes: Yes - Father,Child; Heart Disease: No; Hereditary Spherocytosis: No; Hypertension: Yes - Paternal Grandparents; Kidney Disease: Yes - Father; Lung Disease: Yes - Maternal Grandparents; Seizures: No; Stroke: No; Thyroid Problems: No; Tuberculosis: No; Never smoker; Marital Status - Separated; Alcohol Use: Rarely; Drug Use: No History; Caffeine Use: Rarely; Financial Concerns: No; Food, Clothing or Shelter Needs: No; Support System Lacking: No; Transportation Concerns: No Electronic Signature(s) Signed: 02/17/2023 12:49:28 PM By: [name] Signed: 02/17/2023 12:58:51 PM By: [name] RN Entered By: [name] on 02/16/2023 09:18:51 -------------------------------------------------------------------------------- SuperBill Details Patient Name: Date of Service: [name] 02/16/2023 Medical Record Number: [id number] Patient Account Number: [id number] Date of Birth/Sex: Treating RN: 1971/01/10 (52 y.o. [name] Primary Care Provider: Maudie [name] Other Clinician: Referring Provider: Treating Provider/Extender: [name] in Treatment: 0 Diagnosis Coding ICD-10 Codes Code Description [phone] Non-pressure chronic ulcer of other part of left lower leg with fat layer exposed I87.312  Chronic venous hypertension (idiopathic) with ulcer of left lower extremity I87.321 Chronic venous hypertension (idiopathic) with inflammation of right  [name] [name] Weeks in Treatment: 0 Constitutional . Cardiovascular . Psychiatric . Notes Right lower extremity: No open wounds. 2+ pitting edema to the knee. Left lower extremity: 2 small wounds to the lateral aspect of the left leg and left foot. Extensive large open wound to the medial anterior aspect with granulation tissue and increased depth. No signs of surrounding infection to any of the wound beds. Electronic Signature(s) Signed: 02/17/2023 12:49:28 PM By: [name] Entered By: [name] on 02/16/2023 11:17:39 -------------------------------------------------------------------------------- Physician Orders Details Patient Name: Date of Service: [name] 02/16/2023 8:45 [name] Medical Record Number: [id number] Patient Account Number: [id number] Date of Birth/Sex: Treating RN: 04-22-1971 (52 y.o. [name]) [name] Primary Care Provider: Maudie [name] Other Clinician: Referring Provider: Treating Provider/Extender: [name] in Treatment: 0 Verbal / Phone Orders: No Diagnosis Coding ICD-10 Coding Code Description [phone] Non-pressure chronic ulcer of other part of left lower leg with fat layer  exposed I87.312 Chronic venous hypertension (idiopathic) with ulcer of left lower extremity I87.321 Chronic venous hypertension (idiopathic) with inflammation of right lower extremity E11.622 Type 2 diabetes mellitus with other skin ulcer Discharge From [facility] Services Consult Only - instructed by Dr Mikey Bussing to apply dakins wet to dry dressing to left leg, cover with abd and kerlix then apply single layer Tubi grip F, Bentivegna, [name] ([id number]) [id number] Page 5 of 11 right side abd for protection and tubi grip size D single layer , patient to follow up with [facility] . Wound Treatment Electronic Signature(s) Signed: 02/17/2023 12:49:28 PM By: [name] Previous Signature: 02/16/2023 11:17:59 AM Version By: [name] RN Entered By: [name] on 02/16/2023 11:20:02 -------------------------------------------------------------------------------- Problem List Details Patient Name: Date of Service: [name] 02/16/2023 8:45 [name] Medical Record Number: [id number] Patient Account Number: [id number] Date of Birth/Sex: Treating RN: [birth year] (52 y.o. [name] Primary Care Provider: Maudie [name] Other Clinician: Referring Provider: Treating Provider/Extender: [name] in Treatment: 0 Active Problems ICD-10 Encounter Code Description Active Date MDM Diagnosis [phone] Non-pressure chronic ulcer of other part of left lower leg with fat layer exposed6/19/2024 No Yes I87.312 Chronic venous hypertension (idiopathic) with ulcer of left lower extremity 02/16/2023 No Yes I87.321 Chronic venous hypertension (idiopathic) with inflammation of right lower 02/16/2023 No Yes extremity E11.622 Type 2 diabetes mellitus with other skin ulcer 02/16/2023 No Yes Inactive Problems Resolved Problems Electronic Signature(s) Signed: 02/17/2023 12:49:28 PM By: [name] Entered By: [name]  on 02/16/2023 10:50:34 Niesen, Justino [name] ([id number]) [id number] Page 6 of 11 -------------------------------------------------------------------------------- Progress Note Details Patient Name: Date of Service: [name] 02/16/2023 8:45 [name] Medical Record Number: [id number] Patient Account Number: [id number] Date of Birth/Sex: Treating RN: 10/23/1970 (52 y.o. [name] Primary Care Provider: Maudie [name] Other Clinician: Referring Provider: Treating Provider/Extender: [name] in Treatment: 0 Subjective Chief Complaint Information obtained from Patient 01/20/22; wound to previous central line catheter to the right side of the neck 02/16/2023; ED referral for right lower extremity wound History of Present Illness (HPI) 12/28/17; this is [name] now 52 year old man who is [name] type II diabetic. He was hospitalized from 10/01/17 through 10/19/17. He had an MSSA soft tissue and skin infection. 2 open areas on the left leg were identified he has [name] smaller area on the left medial calf superiorly just below the knee and [name] wound just above  left anterior leg is closed over. He still has the open area on the left lateral mid calf and then the divot injury area which I [name] not think is going to heal just above the left ankle. 3/11; the patient arrives with all the wounds on both legs healed. He has his stocking with the wraparound juxta lite on the right. He has 1 for the left. He initiated dialysis on Monday and we will see what effect that has on his lower extremity edema. He has his compression pumps and he plans to use them at least once [name] day Readmission 01/20/2022 Mr. Costlow [name] is [name] 52 year old male with [name] past medical history of type 1 diabetes on hemodialysis that presents to the clinic for [name] 1 year history of wound to the right side of his neck. He states that he had [name] central line in place when he started hemodialysis while his graft matured. He states that the area where the line was removed never healed. He reports serosanguineous drainage and odor to the area over the  past year. He has not taken antibiotics for this issue. He currently denies systemic signs of infection. He currently keeps the area covered with [name] Band-Aid. 5/31; patient presents for follow-up. He has been taking doxycycline without issues. He had [name] culture done at last clinic visit that showed abundant Streptococcus anginosus sensitive to penicillin. I will go ahead and switch him to Augmentin. He has been using mupirocin ointment to the wound bed and covering this with Hydrofera Blue. Upon inspection today there is [name] piece of dressing tightly adhered in the wound bed that appears to be connected to underlying structures. He obtains his neck ultrasound tomorrow. He denies systemic signs of infection. 6/14; Patient presents for follow-up. Patient had an ultrasound to his neck that showed [name] prior PermCath still present is [name] retained foreign body. This was causing his wound not to heal. On 6/7 the foreign body was removed by AlecSchnier. Since then the area has healed up nicely. He has no issues or complaints today. He denies any drainage, open wounds or discomfort to the previous wound site. 02/16/2023 [name] is [name] 52 year old male with [name] past medical history of end-stage renal disease on hemodialysis, Type 2 diabetes, And lymphedema/venous insufficiency That presents to the clinic for evaluation of his right lower extremity. He has wounds to his left lower extremity for several months but these are being managed by vein and vascular with [name] wound VAC. On 02/04/2023 patient was evaluated for new blisters to the right lower extremity and potential cellulitis at vein and vascular and was recommended to go to the ED. He was started on oral antibiotics by the ED and referred to our clinic for follow-up to assure that the right leg wounds improved. He had an Unna boot placed by vein and vascular two days prior. He has home health that changes the unna boot and wound vac 2 times weekly. He currently  denies signs of infection. There are no open wounds to the right lower extremity. Electronic Signature(s) Signed: 02/17/2023 12:49:28 PM By: [name] Entered By: [name] on 02/16/2023 11:15:17 [name], [name] ([id number]) [id number] Page 4 of 11 -------------------------------------------------------------------------------- Physical Exam Details Patient Name: Date of Service: [name] 02/16/2023 8:45 [name] Medical Record Number: [id number] Patient Account Number: [id number] Date of Birth/Sex: Treating RN: [birth year] (52 y.o. [name] Primary Care Provider: Maudie [name] Other Clinician: Referring Provider: Treating Provider/Extender:  [name], [name] ([id number]) [id number] Page 1 of 11 Visit Report for 02/16/2023 Chief Complaint Document Details Patient Name: Date of Service: [name] LT, [name] 02/16/2023 8:45 [name] Medical Record Number: [id number] Patient Account Number: [id number] Date of Birth/Sex: Treating RN: 12-04-1970 (52 y.o. [name]) [name] Primary Care Provider: Maudie [name] Other Clinician: Referring Provider: Treating Provider/Extender: [name] in Treatment: 0 Information Obtained from: Patient Chief Complaint 01/20/22; wound to previous central line catheter to the right side of the neck 02/16/2023; ED referral for right lower extremity wound Electronic Signature(s) Signed: 02/17/2023 12:49:28 PM By: [name] Entered By: [name] on 02/16/2023 10:51:14 -------------------------------------------------------------------------------- HPI Details Patient Name: Date of Service: [name] 02/16/2023 8:45 [name] Medical Record Number: [id number] Patient Account Number: [id number] Date of Birth/Sex: Treating RN: 1971/07/25 (52 y.o. [name] Primary Care Provider: Maudie [name] Other Clinician: Referring Provider: Treating Provider/Extender: [name] in Treatment: 0 History of Present Illness HPI Description: 12/28/17; this is [name] now 52 year old man who is [name] type II diabetic. He was hospitalized from 10/01/17 through 10/19/17. He had an MSSA soft tissue and skin infection. 2 open areas on the left leg were identified he has [name] smaller area on the left medial calf superiorly just below the knee and [name] wound just above the left ankle on the posterior medial aspect. I think both of these were surgical IandD sites when he was in the hospital. He was discharged with [name] wound VAC at that point however this is since been taken off. He follows with [name] for the [facility] and he is still on chronic  Keflex at 500 twice [name] day. At that time he was hospitalized his hemoglobin A1c was 15.1 however if I'm reading his endocrinologist notes correctly that is improved. He has been following with [name] at vein and vascular and he has been applying calcium alginate and Unna boots. He has home health changing the dressing. They have also been attempting to get him external compression pumps although the patient is unaware whether they've been approved by insurance at this point. as mentioned he has [name] smaller clean wound on the right lateral calf just below the knee and he has [name] much larger area just above the left ankle medially and posteriorly. Our intake nurse reported greenish purulent looking drainage.the patient did have surgical material sent to pathology in February. This showed chronic abscess The patient also has lymphedema stage III in the left greater than right lower extremities. He has [name] history of blisters with wounds but these of all were always healed. The patient thinks that the lymphedema may have been present since he was about 52 years old i.e. about 30 years. He does not have graded pressure stockings and has not worn stockings. He does not have [name] distant history of DVT PE or phlebitis. He has not been systemically unwell fever no chills. [name], [name] ([id number]) [id number] Page 2 of 11 He states that his Lasix is recently been reduced. He tells me his kidney function is at "30%" and he has been followed by [name] of nephrology. At one point he was on Lasix 80 twice [name] day however that's been cut back and he is now on Lasix at 20 twice [name] day. The patient has [name] history of PAD listed in his records although he comes from [name] I don't think is felt to have significant PAD. ABIs  [name] [name] Weeks in Treatment: 0 Constitutional . Cardiovascular . Psychiatric . Notes Right lower extremity: No open wounds. 2+ pitting edema to the knee. Left lower extremity: 2 small wounds to the lateral aspect of the left leg and left foot. Extensive large open wound to the medial anterior aspect with granulation tissue and increased depth. No signs of surrounding infection to any of the wound beds. Electronic Signature(s) Signed: 02/17/2023 12:49:28 PM By: [name] Entered By: [name] on 02/16/2023 11:17:39 -------------------------------------------------------------------------------- Physician Orders Details Patient Name: Date of Service: [name] 02/16/2023 8:45 [name] Medical Record Number: [id number] Patient Account Number: [id number] Date of Birth/Sex: Treating RN: 04-22-1971 (52 y.o. [name]) [name] Primary Care Provider: Maudie [name] Other Clinician: Referring Provider: Treating Provider/Extender: [name] in Treatment: 0 Verbal / Phone Orders: No Diagnosis Coding ICD-10 Coding Code Description [phone] Non-pressure chronic ulcer of other part of left lower leg with fat layer  exposed I87.312 Chronic venous hypertension (idiopathic) with ulcer of left lower extremity I87.321 Chronic venous hypertension (idiopathic) with inflammation of right lower extremity E11.622 Type 2 diabetes mellitus with other skin ulcer Discharge From [facility] Services Consult Only - instructed by Dr Mikey Bussing to apply dakins wet to dry dressing to left leg, cover with abd and kerlix then apply single layer Tubi grip F, Bentivegna, [name] ([id number]) [id number] Page 5 of 11 right side abd for protection and tubi grip size D single layer , patient to follow up with [facility] . Wound Treatment Electronic Signature(s) Signed: 02/17/2023 12:49:28 PM By: [name] Previous Signature: 02/16/2023 11:17:59 AM Version By: [name] RN Entered By: [name] on 02/16/2023 11:20:02 -------------------------------------------------------------------------------- Problem List Details Patient Name: Date of Service: [name] 02/16/2023 8:45 [name] Medical Record Number: [id number] Patient Account Number: [id number] Date of Birth/Sex: Treating RN: [birth year] (52 y.o. [name] Primary Care Provider: Maudie [name] Other Clinician: Referring Provider: Treating Provider/Extender: [name] in Treatment: 0 Active Problems ICD-10 Encounter Code Description Active Date MDM Diagnosis [phone] Non-pressure chronic ulcer of other part of left lower leg with fat layer exposed6/19/2024 No Yes I87.312 Chronic venous hypertension (idiopathic) with ulcer of left lower extremity 02/16/2023 No Yes I87.321 Chronic venous hypertension (idiopathic) with inflammation of right lower 02/16/2023 No Yes extremity E11.622 Type 2 diabetes mellitus with other skin ulcer 02/16/2023 No Yes Inactive Problems Resolved Problems Electronic Signature(s) Signed: 02/17/2023 12:49:28 PM By: [name] Entered By: [name]  on 02/16/2023 10:50:34 Niesen, Justino [name] ([id number]) [id number] Page 6 of 11 -------------------------------------------------------------------------------- Progress Note Details Patient Name: Date of Service: [name] 02/16/2023 8:45 [name] Medical Record Number: [id number] Patient Account Number: [id number] Date of Birth/Sex: Treating RN: 10/23/1970 (52 y.o. [name] Primary Care Provider: Maudie [name] Other Clinician: Referring Provider: Treating Provider/Extender: [name] in Treatment: 0 Subjective Chief Complaint Information obtained from Patient 01/20/22; wound to previous central line catheter to the right side of the neck 02/16/2023; ED referral for right lower extremity wound History of Present Illness (HPI) 12/28/17; this is [name] now 52 year old man who is [name] type II diabetic. He was hospitalized from 10/01/17 through 10/19/17. He had an MSSA soft tissue and skin infection. 2 open areas on the left leg were identified he has [name] smaller area on the left medial calf superiorly just below the knee and [name] wound just above  left anterior leg is closed over. He still has the open area on the left lateral mid calf and then the divot injury area which I [name] not think is going to heal just above the left ankle. 3/11; the patient arrives with all the wounds on both legs healed. He has his stocking with the wraparound juxta lite on the right. He has 1 for the left. He initiated dialysis on Monday and we will see what effect that has on his lower extremity edema. He has his compression pumps and he plans to use them at least once [name] day Readmission 01/20/2022 Mr. Costlow [name] is [name] 52 year old male with [name] past medical history of type 1 diabetes on hemodialysis that presents to the clinic for [name] 1 year history of wound to the right side of his neck. He states that he had [name] central line in place when he started hemodialysis while his graft matured. He states that the area where the line was removed never healed. He reports serosanguineous drainage and odor to the area over the  past year. He has not taken antibiotics for this issue. He currently denies systemic signs of infection. He currently keeps the area covered with [name] Band-Aid. 5/31; patient presents for follow-up. He has been taking doxycycline without issues. He had [name] culture done at last clinic visit that showed abundant Streptococcus anginosus sensitive to penicillin. I will go ahead and switch him to Augmentin. He has been using mupirocin ointment to the wound bed and covering this with Hydrofera Blue. Upon inspection today there is [name] piece of dressing tightly adhered in the wound bed that appears to be connected to underlying structures. He obtains his neck ultrasound tomorrow. He denies systemic signs of infection. 6/14; Patient presents for follow-up. Patient had an ultrasound to his neck that showed [name] prior PermCath still present is [name] retained foreign body. This was causing his wound not to heal. On 6/7 the foreign body was removed by AlecSchnier. Since then the area has healed up nicely. He has no issues or complaints today. He denies any drainage, open wounds or discomfort to the previous wound site. 02/16/2023 [name] is [name] 52 year old male with [name] past medical history of end-stage renal disease on hemodialysis, Type 2 diabetes, And lymphedema/venous insufficiency That presents to the clinic for evaluation of his right lower extremity. He has wounds to his left lower extremity for several months but these are being managed by vein and vascular with [name] wound VAC. On 02/04/2023 patient was evaluated for new blisters to the right lower extremity and potential cellulitis at vein and vascular and was recommended to go to the ED. He was started on oral antibiotics by the ED and referred to our clinic for follow-up to assure that the right leg wounds improved. He had an Unna boot placed by vein and vascular two days prior. He has home health that changes the unna boot and wound vac 2 times weekly. He currently  denies signs of infection. There are no open wounds to the right lower extremity. Electronic Signature(s) Signed: 02/17/2023 12:49:28 PM By: [name] Entered By: [name] on 02/16/2023 11:15:17 [name], [name] ([id number]) [id number] Page 4 of 11 -------------------------------------------------------------------------------- Physical Exam Details Patient Name: Date of Service: [name] 02/16/2023 8:45 [name] Medical Record Number: [id number] Patient Account Number: [id number] Date of Birth/Sex: Treating RN: [birth year] (52 y.o. [name] Primary Care Provider: Maudie [name] Other Clinician: Referring Provider: Treating Provider/Extender:  x 0.3cm depth; 4.32cm^2 area and 1.296cm^3 volume. There is Fat Layer (Subcutaneous Tissue) exposed. There is no tunneling or undermining noted. There is [name] medium amount of serosanguineous drainage noted. There is medium (34-66%) red granulation within the wound bed. There is [name] medium  (34-66%) amount of necrotic tissue within the wound bed including Adherent Slough. Wound #22 status is Open. Original cause of wound was Gradually Appeared. The date acquired was: 08/30/2022. The wound is located on the Left Lower Leg. The wound measures 11cm length x 10.5cm width x 0.6cm depth; 90.713cm^2 area and 54.428cm^3 volume. There is Fat Layer (Subcutaneous Tissue) exposed. There is no tunneling or undermining noted. There is [name] medium amount of serosanguineous drainage noted. There is large (67-100%) red granulation within the wound bed. There is [name] small (1-33%) amount of necrotic tissue within the wound bed including Adherent Slough. Assessment Active Problems ICD-10 Non-pressure chronic ulcer of other part of left lower leg with fat layer exposed Chronic venous hypertension (idiopathic) with ulcer of left lower extremity Chronic venous hypertension (idiopathic) with inflammation of right lower extremity Type 2 diabetes mellitus with other skin ulcer Patient was referred to our clinic by the ED for follow-up of his right lower extremity wounds for which have healed. We gave him information to order compression stockings. He is being managed by vein and vascular for his left lower extremity wounds. He would like to continue following with them. He may follow-up with [place] as needed. Plan 1. Follow-up as needed Electronic Signature(s) Signed: 02/17/2023 12:49:28 PM By: [name] Entered By: [name] on 02/16/2023 11:19:35 -------------------------------------------------------------------------------- ROS/PFSH Details Patient Name: Date of Service: [name] 02/16/2023 8:45 [name] Medical Record Number: [id number] Patient Account Number: [id number] Date of Birth/Sex: Treating RN: 07/07/1971 (52 y.o. [name] Primary Care Provider: Maudie [name] Other Clinician: Referring Provider: Treating Provider/Extender: [name] in  Treatment: 0 Information Obtained From Patient Cardiovascular Medical History: Positive for: Hypertension Endocrine Medical History: Positive for: Type II Diabetes Time with diabetes: 15 yrs Treated with: Insulin, Oral agents [name], [name] ([id number]) [id number] Page 10 of 11 Blood sugar tested every day: Yes Tested : Neurologic Medical History: Positive for: Neuropathy Immunizations Pneumococcal Vaccine: Received Pneumococcal Vaccination: Yes Received Pneumococcal Vaccination On or After 60th Birthday: No Implantable Devices No devices added Family and Social History Cancer: Yes - Maternal Grandparents; Diabetes: Yes - Father,Child; Heart Disease: No; Hereditary Spherocytosis: No; Hypertension: Yes - Paternal Grandparents; Kidney Disease: Yes - Father; Lung Disease: Yes - Maternal Grandparents; Seizures: No; Stroke: No; Thyroid Problems: No; Tuberculosis: No; Never smoker; Marital Status - Separated; Alcohol Use: Rarely; Drug Use: No History; Caffeine Use: Rarely; Financial Concerns: No; Food, Clothing or Shelter Needs: No; Support System Lacking: No; Transportation Concerns: No Electronic Signature(s) Signed: 02/17/2023 12:49:28 PM By: [name] Signed: 02/17/2023 12:58:51 PM By: [name] RN Entered By: [name] on 02/16/2023 09:18:51 -------------------------------------------------------------------------------- SuperBill Details Patient Name: Date of Service: [name] 02/16/2023 Medical Record Number: [id number] Patient Account Number: [id number] Date of Birth/Sex: Treating RN: 1971/01/10 (52 y.o. [name] Primary Care Provider: Maudie [name] Other Clinician: Referring Provider: Treating Provider/Extender: [name] in Treatment: 0 Diagnosis Coding ICD-10 Codes Code Description [phone] Non-pressure chronic ulcer of other part of left lower leg with fat layer exposed I87.312  Chronic venous hypertension (idiopathic) with ulcer of left lower extremity I87.321 Chronic venous hypertension (idiopathic) with inflammation of right  x 0.3cm depth; 4.32cm^2 area and 1.296cm^3 volume. There is Fat Layer (Subcutaneous Tissue) exposed. There is no tunneling or undermining noted. There is [name] medium amount of serosanguineous drainage noted. There is medium (34-66%) red granulation within the wound bed. There is [name] medium  (34-66%) amount of necrotic tissue within the wound bed including Adherent Slough. Wound #22 status is Open. Original cause of wound was Gradually Appeared. The date acquired was: 08/30/2022. The wound is located on the Left Lower Leg. The wound measures 11cm length x 10.5cm width x 0.6cm depth; 90.713cm^2 area and 54.428cm^3 volume. There is Fat Layer (Subcutaneous Tissue) exposed. There is no tunneling or undermining noted. There is [name] medium amount of serosanguineous drainage noted. There is large (67-100%) red granulation within the wound bed. There is [name] small (1-33%) amount of necrotic tissue within the wound bed including Adherent Slough. Assessment Active Problems ICD-10 Non-pressure chronic ulcer of other part of left lower leg with fat layer exposed Chronic venous hypertension (idiopathic) with ulcer of left lower extremity Chronic venous hypertension (idiopathic) with inflammation of right lower extremity Type 2 diabetes mellitus with other skin ulcer Patient was referred to our clinic by the ED for follow-up of his right lower extremity wounds for which have healed. We gave him information to order compression stockings. He is being managed by vein and vascular for his left lower extremity wounds. He would like to continue following with them. He may follow-up with [place] as needed. Plan 1. Follow-up as needed Electronic Signature(s) Signed: 02/17/2023 12:49:28 PM By: [name] Entered By: [name] on 02/16/2023 11:19:35 -------------------------------------------------------------------------------- ROS/PFSH Details Patient Name: Date of Service: [name] 02/16/2023 8:45 [name] Medical Record Number: [id number] Patient Account Number: [id number] Date of Birth/Sex: Treating RN: 07/07/1971 (52 y.o. [name] Primary Care Provider: Maudie [name] Other Clinician: Referring Provider: Treating Provider/Extender: [name] in  Treatment: 0 Information Obtained From Patient Cardiovascular Medical History: Positive for: Hypertension Endocrine Medical History: Positive for: Type II Diabetes Time with diabetes: 15 yrs Treated with: Insulin, Oral agents [name], [name] ([id number]) [id number] Page 10 of 11 Blood sugar tested every day: Yes Tested : Neurologic Medical History: Positive for: Neuropathy Immunizations Pneumococcal Vaccine: Received Pneumococcal Vaccination: Yes Received Pneumococcal Vaccination On or After 60th Birthday: No Implantable Devices No devices added Family and Social History Cancer: Yes - Maternal Grandparents; Diabetes: Yes - Father,Child; Heart Disease: No; Hereditary Spherocytosis: No; Hypertension: Yes - Paternal Grandparents; Kidney Disease: Yes - Father; Lung Disease: Yes - Maternal Grandparents; Seizures: No; Stroke: No; Thyroid Problems: No; Tuberculosis: No; Never smoker; Marital Status - Separated; Alcohol Use: Rarely; Drug Use: No History; Caffeine Use: Rarely; Financial Concerns: No; Food, Clothing or Shelter Needs: No; Support System Lacking: No; Transportation Concerns: No Electronic Signature(s) Signed: 02/17/2023 12:49:28 PM By: [name] Signed: 02/17/2023 12:58:51 PM By: [name] RN Entered By: [name] on 02/16/2023 09:18:51 -------------------------------------------------------------------------------- SuperBill Details Patient Name: Date of Service: [name] 02/16/2023 Medical Record Number: [id number] Patient Account Number: [id number] Date of Birth/Sex: Treating RN: 1971/01/10 (52 y.o. [name] Primary Care Provider: Maudie [name] Other Clinician: Referring Provider: Treating Provider/Extender: [name] in Treatment: 0 Diagnosis Coding ICD-10 Codes Code Description [phone] Non-pressure chronic ulcer of other part of left lower leg with fat layer exposed I87.312  Chronic venous hypertension (idiopathic) with ulcer of left lower extremity I87.321 Chronic venous hypertension (idiopathic) with inflammation of right  [name] [name] Weeks in Treatment: 0 Constitutional . Cardiovascular . Psychiatric . Notes Right lower extremity: No open wounds. 2+ pitting edema to the knee. Left lower extremity: 2 small wounds to the lateral aspect of the left leg and left foot. Extensive large open wound to the medial anterior aspect with granulation tissue and increased depth. No signs of surrounding infection to any of the wound beds. Electronic Signature(s) Signed: 02/17/2023 12:49:28 PM By: [name] Entered By: [name] on 02/16/2023 11:17:39 -------------------------------------------------------------------------------- Physician Orders Details Patient Name: Date of Service: [name] 02/16/2023 8:45 [name] Medical Record Number: [id number] Patient Account Number: [id number] Date of Birth/Sex: Treating RN: 04-22-1971 (52 y.o. [name]) [name] Primary Care Provider: Maudie [name] Other Clinician: Referring Provider: Treating Provider/Extender: [name] in Treatment: 0 Verbal / Phone Orders: No Diagnosis Coding ICD-10 Coding Code Description [phone] Non-pressure chronic ulcer of other part of left lower leg with fat layer  exposed I87.312 Chronic venous hypertension (idiopathic) with ulcer of left lower extremity I87.321 Chronic venous hypertension (idiopathic) with inflammation of right lower extremity E11.622 Type 2 diabetes mellitus with other skin ulcer Discharge From [facility] Services Consult Only - instructed by Dr Mikey Bussing to apply dakins wet to dry dressing to left leg, cover with abd and kerlix then apply single layer Tubi grip F, Bentivegna, [name] ([id number]) [id number] Page 5 of 11 right side abd for protection and tubi grip size D single layer , patient to follow up with [facility] . Wound Treatment Electronic Signature(s) Signed: 02/17/2023 12:49:28 PM By: [name] Previous Signature: 02/16/2023 11:17:59 AM Version By: [name] RN Entered By: [name] on 02/16/2023 11:20:02 -------------------------------------------------------------------------------- Problem List Details Patient Name: Date of Service: [name] 02/16/2023 8:45 [name] Medical Record Number: [id number] Patient Account Number: [id number] Date of Birth/Sex: Treating RN: [birth year] (52 y.o. [name] Primary Care Provider: Maudie [name] Other Clinician: Referring Provider: Treating Provider/Extender: [name] in Treatment: 0 Active Problems ICD-10 Encounter Code Description Active Date MDM Diagnosis [phone] Non-pressure chronic ulcer of other part of left lower leg with fat layer exposed6/19/2024 No Yes I87.312 Chronic venous hypertension (idiopathic) with ulcer of left lower extremity 02/16/2023 No Yes I87.321 Chronic venous hypertension (idiopathic) with inflammation of right lower 02/16/2023 No Yes extremity E11.622 Type 2 diabetes mellitus with other skin ulcer 02/16/2023 No Yes Inactive Problems Resolved Problems Electronic Signature(s) Signed: 02/17/2023 12:49:28 PM By: [name] Entered By: [name]  on 02/16/2023 10:50:34 Niesen, Justino [name] ([id number]) [id number] Page 6 of 11 -------------------------------------------------------------------------------- Progress Note Details Patient Name: Date of Service: [name] 02/16/2023 8:45 [name] Medical Record Number: [id number] Patient Account Number: [id number] Date of Birth/Sex: Treating RN: 10/23/1970 (52 y.o. [name] Primary Care Provider: Maudie [name] Other Clinician: Referring Provider: Treating Provider/Extender: [name] in Treatment: 0 Subjective Chief Complaint Information obtained from Patient 01/20/22; wound to previous central line catheter to the right side of the neck 02/16/2023; ED referral for right lower extremity wound History of Present Illness (HPI) 12/28/17; this is [name] now 52 year old man who is [name] type II diabetic. He was hospitalized from 10/01/17 through 10/19/17. He had an MSSA soft tissue and skin infection. 2 open areas on the left leg were identified he has [name] smaller area on the left medial calf superiorly just below the knee and [name] wound just above  x 0.3cm depth; 4.32cm^2 area and 1.296cm^3 volume. There is Fat Layer (Subcutaneous Tissue) exposed. There is no tunneling or undermining noted. There is [name] medium amount of serosanguineous drainage noted. There is medium (34-66%) red granulation within the wound bed. There is [name] medium  (34-66%) amount of necrotic tissue within the wound bed including Adherent Slough. Wound #22 status is Open. Original cause of wound was Gradually Appeared. The date acquired was: 08/30/2022. The wound is located on the Left Lower Leg. The wound measures 11cm length x 10.5cm width x 0.6cm depth; 90.713cm^2 area and 54.428cm^3 volume. There is Fat Layer (Subcutaneous Tissue) exposed. There is no tunneling or undermining noted. There is [name] medium amount of serosanguineous drainage noted. There is large (67-100%) red granulation within the wound bed. There is [name] small (1-33%) amount of necrotic tissue within the wound bed including Adherent Slough. Assessment Active Problems ICD-10 Non-pressure chronic ulcer of other part of left lower leg with fat layer exposed Chronic venous hypertension (idiopathic) with ulcer of left lower extremity Chronic venous hypertension (idiopathic) with inflammation of right lower extremity Type 2 diabetes mellitus with other skin ulcer Patient was referred to our clinic by the ED for follow-up of his right lower extremity wounds for which have healed. We gave him information to order compression stockings. He is being managed by vein and vascular for his left lower extremity wounds. He would like to continue following with them. He may follow-up with [place] as needed. Plan 1. Follow-up as needed Electronic Signature(s) Signed: 02/17/2023 12:49:28 PM By: [name] Entered By: [name] on 02/16/2023 11:19:35 -------------------------------------------------------------------------------- ROS/PFSH Details Patient Name: Date of Service: [name] 02/16/2023 8:45 [name] Medical Record Number: [id number] Patient Account Number: [id number] Date of Birth/Sex: Treating RN: 07/07/1971 (52 y.o. [name] Primary Care Provider: Maudie [name] Other Clinician: Referring Provider: Treating Provider/Extender: [name] in  Treatment: 0 Information Obtained From Patient Cardiovascular Medical History: Positive for: Hypertension Endocrine Medical History: Positive for: Type II Diabetes Time with diabetes: 15 yrs Treated with: Insulin, Oral agents [name], [name] ([id number]) [id number] Page 10 of 11 Blood sugar tested every day: Yes Tested : Neurologic Medical History: Positive for: Neuropathy Immunizations Pneumococcal Vaccine: Received Pneumococcal Vaccination: Yes Received Pneumococcal Vaccination On or After 60th Birthday: No Implantable Devices No devices added Family and Social History Cancer: Yes - Maternal Grandparents; Diabetes: Yes - Father,Child; Heart Disease: No; Hereditary Spherocytosis: No; Hypertension: Yes - Paternal Grandparents; Kidney Disease: Yes - Father; Lung Disease: Yes - Maternal Grandparents; Seizures: No; Stroke: No; Thyroid Problems: No; Tuberculosis: No; Never smoker; Marital Status - Separated; Alcohol Use: Rarely; Drug Use: No History; Caffeine Use: Rarely; Financial Concerns: No; Food, Clothing or Shelter Needs: No; Support System Lacking: No; Transportation Concerns: No Electronic Signature(s) Signed: 02/17/2023 12:49:28 PM By: [name] Signed: 02/17/2023 12:58:51 PM By: [name] RN Entered By: [name] on 02/16/2023 09:18:51 -------------------------------------------------------------------------------- SuperBill Details Patient Name: Date of Service: [name] 02/16/2023 Medical Record Number: [id number] Patient Account Number: [id number] Date of Birth/Sex: Treating RN: 1971/01/10 (52 y.o. [name] Primary Care Provider: Maudie [name] Other Clinician: Referring Provider: Treating Provider/Extender: [name] in Treatment: 0 Diagnosis Coding ICD-10 Codes Code Description 413 [id number] Non-pressure chronic ulcer of other part of left lower leg with fat layer exposed I87.312  Chronic venous hypertension (idiopathic) with ulcer of left lower extremity I87.321 Chronic venous hypertension (idiopathic) with inflammation of right  left anterior leg is closed over. He still has the open area on the left lateral mid calf and then the divot injury area which I [name] not think is going to heal just above the left ankle. 3/11; the patient arrives with all the wounds on both legs healed. He has his stocking with the wraparound juxta lite on the right. He has 1 for the left. He initiated dialysis on Monday and we will see what effect that has on his lower extremity edema. He has his compression pumps and he plans to use them at least once [name] day Readmission 01/20/2022 Mr. Costlow [name] is [name] 52 year old male with [name] past medical history of type 1 diabetes on hemodialysis that presents to the clinic for [name] 1 year history of wound to the right side of his neck. He states that he had [name] central line in place when he started hemodialysis while his graft matured. He states that the area where the line was removed never healed. He reports serosanguineous drainage and odor to the area over the  past year. He has not taken antibiotics for this issue. He currently denies systemic signs of infection. He currently keeps the area covered with [name] Band-Aid. 5/31; patient presents for follow-up. He has been taking doxycycline without issues. He had [name] culture done at last clinic visit that showed abundant Streptococcus anginosus sensitive to penicillin. I will go ahead and switch him to Augmentin. He has been using mupirocin ointment to the wound bed and covering this with Hydrofera Blue. Upon inspection today there is [name] piece of dressing tightly adhered in the wound bed that appears to be connected to underlying structures. He obtains his neck ultrasound tomorrow. He denies systemic signs of infection. 6/14; Patient presents for follow-up. Patient had an ultrasound to his neck that showed [name] prior PermCath still present is [name] retained foreign body. This was causing his wound not to heal. On 6/7 the foreign body was removed by AlecSchnier. Since then the area has healed up nicely. He has no issues or complaints today. He denies any drainage, open wounds or discomfort to the previous wound site. 02/16/2023 [name] is [name] 52 year old male with [name] past medical history of end-stage renal disease on hemodialysis, Type 2 diabetes, And lymphedema/venous insufficiency That presents to the clinic for evaluation of his right lower extremity. He has wounds to his left lower extremity for several months but these are being managed by vein and vascular with [name] wound VAC. On 02/04/2023 patient was evaluated for new blisters to the right lower extremity and potential cellulitis at vein and vascular and was recommended to go to the ED. He was started on oral antibiotics by the ED and referred to our clinic for follow-up to assure that the right leg wounds improved. He had an Unna boot placed by vein and vascular two days prior. He has home health that changes the unna boot and wound vac 2 times weekly. He currently  denies signs of infection. There are no open wounds to the right lower extremity. Electronic Signature(s) Signed: 02/17/2023 12:49:28 PM By: [name] Entered By: [name] on 02/16/2023 11:15:17 [name], [name] ([id number]) [id number] Page 4 of 11 -------------------------------------------------------------------------------- Physical Exam Details Patient Name: Date of Service: [name] 02/16/2023 8:45 [name] Medical Record Number: [id number] Patient Account Number: [id number] Date of Birth/Sex: Treating RN: [birth year] (52 y.o. [name] Primary Care Provider: Maudie [name] Other Clinician: Referring Provider: Treating Provider/Extender:  [name], [name] ([id number]) [id number] Page 1 of 11 Visit Report for 02/16/2023 Chief Complaint Document Details Patient Name: Date of Service: [name] LT, [name] 02/16/2023 8:45 [name] Medical Record Number: [id number] Patient Account Number: [id number] Date of Birth/Sex: Treating RN: 12-04-1970 (52 y.o. [name]) [name] Primary Care Provider: Maudie [name] Other Clinician: Referring Provider: Treating Provider/Extender: [name] in Treatment: 0 Information Obtained from: Patient Chief Complaint 01/20/22; wound to previous central line catheter to the right side of the neck 02/16/2023; ED referral for right lower extremity wound Electronic Signature(s) Signed: 02/17/2023 12:49:28 PM By: [name] Entered By: [name] on 02/16/2023 10:51:14 -------------------------------------------------------------------------------- HPI Details Patient Name: Date of Service: [name] 02/16/2023 8:45 [name] Medical Record Number: [id number] Patient Account Number: [id number] Date of Birth/Sex: Treating RN: 1971/07/25 (52 y.o. [name] Primary Care Provider: Maudie [name] Other Clinician: Referring Provider: Treating Provider/Extender: [name] in Treatment: 0 History of Present Illness HPI Description: 12/28/17; this is [name] now 52 year old man who is [name] type II diabetic. He was hospitalized from 10/01/17 through 10/19/17. He had an MSSA soft tissue and skin infection. 2 open areas on the left leg were identified he has [name] smaller area on the left medial calf superiorly just below the knee and [name] wound just above the left ankle on the posterior medial aspect. I think both of these were surgical IandD sites when he was in the hospital. He was discharged with [name] wound VAC at that point however this is since been taken off. He follows with [name] for the [facility] and he is still on chronic  Keflex at 500 twice [name] day. At that time he was hospitalized his hemoglobin A1c was 15.1 however if I'm reading his endocrinologist notes correctly that is improved. He has been following with [name] at vein and vascular and he has been applying calcium alginate and Unna boots. He has home health changing the dressing. They have also been attempting to get him external compression pumps although the patient is unaware whether they've been approved by insurance at this point. as mentioned he has [name] smaller clean wound on the right lateral calf just below the knee and he has [name] much larger area just above the left ankle medially and posteriorly. Our intake nurse reported greenish purulent looking drainage.the patient did have surgical material sent to pathology in February. This showed chronic abscess The patient also has lymphedema stage III in the left greater than right lower extremities. He has [name] history of blisters with wounds but these of all were always healed. The patient thinks that the lymphedema may have been present since he was about 52 years old i.e. about 30 years. He does not have graded pressure stockings and has not worn stockings. He does not have [name] distant history of DVT PE or phlebitis. He has not been systemically unwell fever no chills. [name], [name] ([id number]) [id number] Page 2 of 11 He states that his Lasix is recently been reduced. He tells me his kidney function is at "30%" and he has been followed by [name] of nephrology. At one point he was on Lasix 80 twice [name] day however that's been cut back and he is now on Lasix at 20 twice [name] day. The patient has [name] history of PAD listed in his records although he comes from [name] I don't think is felt to have significant PAD. ABIs  left anterior leg is closed over. He still has the open area on the left lateral mid calf and then the divot injury area which I [name] not think is going to heal just above the left ankle. 3/11; the patient arrives with all the wounds on both legs healed. He has his stocking with the wraparound juxta lite on the right. He has 1 for the left. He initiated dialysis on Monday and we will see what effect that has on his lower extremity edema. He has his compression pumps and he plans to use them at least once [name] day Readmission 01/20/2022 Mr. Costlow [name] is [name] 52 year old male with [name] past medical history of type 1 diabetes on hemodialysis that presents to the clinic for [name] 1 year history of wound to the right side of his neck. He states that he had [name] central line in place when he started hemodialysis while his graft matured. He states that the area where the line was removed never healed. He reports serosanguineous drainage and odor to the area over the  past year. He has not taken antibiotics for this issue. He currently denies systemic signs of infection. He currently keeps the area covered with [name] Band-Aid. 5/31; patient presents for follow-up. He has been taking doxycycline without issues. He had [name] culture done at last clinic visit that showed abundant Streptococcus anginosus sensitive to penicillin. I will go ahead and switch him to Augmentin. He has been using mupirocin ointment to the wound bed and covering this with Hydrofera Blue. Upon inspection today there is [name] piece of dressing tightly adhered in the wound bed that appears to be connected to underlying structures. He obtains his neck ultrasound tomorrow. He denies systemic signs of infection. 6/14; Patient presents for follow-up. Patient had an ultrasound to his neck that showed [name] prior PermCath still present is [name] retained foreign body. This was causing his wound not to heal. On 6/7 the foreign body was removed by AlecSchnier. Since then the area has healed up nicely. He has no issues or complaints today. He denies any drainage, open wounds or discomfort to the previous wound site. 02/16/2023 [name] is [name] 52 year old male with [name] past medical history of end-stage renal disease on hemodialysis, Type 2 diabetes, And lymphedema/venous insufficiency That presents to the clinic for evaluation of his right lower extremity. He has wounds to his left lower extremity for several months but these are being managed by vein and vascular with [name] wound VAC. On 02/04/2023 patient was evaluated for new blisters to the right lower extremity and potential cellulitis at vein and vascular and was recommended to go to the ED. He was started on oral antibiotics by the ED and referred to our clinic for follow-up to assure that the right leg wounds improved. He had an Unna boot placed by vein and vascular two days prior. He has home health that changes the unna boot and wound vac 2 times weekly. He currently  denies signs of infection. There are no open wounds to the right lower extremity. Electronic Signature(s) Signed: 02/17/2023 12:49:28 PM By: [name] Entered By: [name] on 02/16/2023 11:15:17 [name], [name] ([id number]) [id number] Page 4 of 11 -------------------------------------------------------------------------------- Physical Exam Details Patient Name: Date of Service: [name] 02/16/2023 8:45 [name] Medical Record Number: [id number] Patient Account Number: [id number] Date of Birth/Sex: Treating RN: [birth year] (52 y.o. [name] Primary Care Provider: Maudie [name] Other Clinician: Referring Provider: Treating Provider/Extender:  x 0.3cm depth; 4.32cm^2 area and 1.296cm^3 volume. There is Fat Layer (Subcutaneous Tissue) exposed. There is no tunneling or undermining noted. There is [name] medium amount of serosanguineous drainage noted. There is medium (34-66%) red granulation within the wound bed. There is [name] medium  (34-66%) amount of necrotic tissue within the wound bed including Adherent Slough. Wound #22 status is Open. Original cause of wound was Gradually Appeared. The date acquired was: 08/30/2022. The wound is located on the Left Lower Leg. The wound measures 11cm length x 10.5cm width x 0.6cm depth; 90.713cm^2 area and 54.428cm^3 volume. There is Fat Layer (Subcutaneous Tissue) exposed. There is no tunneling or undermining noted. There is [name] medium amount of serosanguineous drainage noted. There is large (67-100%) red granulation within the wound bed. There is [name] small (1-33%) amount of necrotic tissue within the wound bed including Adherent Slough. Assessment Active Problems ICD-10 Non-pressure chronic ulcer of other part of left lower leg with fat layer exposed Chronic venous hypertension (idiopathic) with ulcer of left lower extremity Chronic venous hypertension (idiopathic) with inflammation of right lower extremity Type 2 diabetes mellitus with other skin ulcer Patient was referred to our clinic by the ED for follow-up of his right lower extremity wounds for which have healed. We gave him information to order compression stockings. He is being managed by vein and vascular for his left lower extremity wounds. He would like to continue following with them. He may follow-up with [place] as needed. Plan 1. Follow-up as needed Electronic Signature(s) Signed: 02/17/2023 12:49:28 PM By: [name] Entered By: [name] on 02/16/2023 11:19:35 -------------------------------------------------------------------------------- ROS/PFSH Details Patient Name: Date of Service: [name] 02/16/2023 8:45 [name] Medical Record Number: [id number] Patient Account Number: [id number] Date of Birth/Sex: Treating RN: 07/07/1971 (52 y.o. [name] Primary Care Provider: Maudie [name] Other Clinician: Referring Provider: Treating Provider/Extender: [name] in  Treatment: 0 Information Obtained From Patient Cardiovascular Medical History: Positive for: Hypertension Endocrine Medical History: Positive for: Type II Diabetes Time with diabetes: 15 yrs Treated with: Insulin, Oral agents [name], [name] ([id number]) [id number] Page 10 of 11 Blood sugar tested every day: Yes Tested : Neurologic Medical History: Positive for: Neuropathy Immunizations Pneumococcal Vaccine: Received Pneumococcal Vaccination: Yes Received Pneumococcal Vaccination On or After 60th Birthday: No Implantable Devices No devices added Family and Social History Cancer: Yes - Maternal Grandparents; Diabetes: Yes - Father,Child; Heart Disease: No; Hereditary Spherocytosis: No; Hypertension: Yes - Paternal Grandparents; Kidney Disease: Yes - Father; Lung Disease: Yes - Maternal Grandparents; Seizures: No; Stroke: No; Thyroid Problems: No; Tuberculosis: No; Never smoker; Marital Status - Separated; Alcohol Use: Rarely; Drug Use: No History; Caffeine Use: Rarely; Financial Concerns: No; Food, Clothing or Shelter Needs: No; Support System Lacking: No; Transportation Concerns: No Electronic Signature(s) Signed: 02/17/2023 12:49:28 PM By: [name] Signed: 02/17/2023 12:58:51 PM By: [name] RN Entered By: [name] on 02/16/2023 09:18:51 -------------------------------------------------------------------------------- SuperBill Details Patient Name: Date of Service: [name] 02/16/2023 Medical Record Number: [id number] Patient Account Number: [id number] Date of Birth/Sex: Treating RN: 1971/01/10 (52 y.o. [name] Primary Care Provider: Maudie [name] Other Clinician: Referring Provider: Treating Provider/Extender: [name] in Treatment: 0 Diagnosis Coding ICD-10 Codes Code Description [phone] Non-pressure chronic ulcer of other part of left lower leg with fat layer exposed I87.312  Chronic venous hypertension (idiopathic) with ulcer of left lower extremity I87.321 Chronic venous hypertension (idiopathic) with inflammation of right  x 0.3cm depth; 4.32cm^2 area and 1.296cm^3 volume. There is Fat Layer (Subcutaneous Tissue) exposed. There is no tunneling or undermining noted. There is [name] medium amount of serosanguineous drainage noted. There is medium (34-66%) red granulation within the wound bed. There is [name] medium  (34-66%) amount of necrotic tissue within the wound bed including Adherent Slough. Wound #22 status is Open. Original cause of wound was Gradually Appeared. The date acquired was: 08/30/2022. The wound is located on the Left Lower Leg. The wound measures 11cm length x 10.5cm width x 0.6cm depth; 90.713cm^2 area and 54.428cm^3 volume. There is Fat Layer (Subcutaneous Tissue) exposed. There is no tunneling or undermining noted. There is [name] medium amount of serosanguineous drainage noted. There is large (67-100%) red granulation within the wound bed. There is [name] small (1-33%) amount of necrotic tissue within the wound bed including Adherent Slough. Assessment Active Problems ICD-10 Non-pressure chronic ulcer of other part of left lower leg with fat layer exposed Chronic venous hypertension (idiopathic) with ulcer of left lower extremity Chronic venous hypertension (idiopathic) with inflammation of right lower extremity Type 2 diabetes mellitus with other skin ulcer Patient was referred to our clinic by the ED for follow-up of his right lower extremity wounds for which have healed. We gave him information to order compression stockings. He is being managed by vein and vascular for his left lower extremity wounds. He would like to continue following with them. He may follow-up with [place] as needed. Plan 1. Follow-up as needed Electronic Signature(s) Signed: 02/17/2023 12:49:28 PM By: [name] Entered By: [name] on 02/16/2023 11:19:35 -------------------------------------------------------------------------------- ROS/PFSH Details Patient Name: Date of Service: [name] 02/16/2023 8:45 [name] Medical Record Number: [id number] Patient Account Number: [id number] Date of Birth/Sex: Treating RN: 07/07/1971 (52 y.o. [name] Primary Care Provider: Maudie [name] Other Clinician: Referring Provider: Treating Provider/Extender: [name] in  Treatment: 0 Information Obtained From Patient Cardiovascular Medical History: Positive for: Hypertension Endocrine Medical History: Positive for: Type II Diabetes Time with diabetes: 15 yrs Treated with: Insulin, Oral agents [name], [name] ([id number]) [id number] Page 10 of 11 Blood sugar tested every day: Yes Tested : Neurologic Medical History: Positive for: Neuropathy Immunizations Pneumococcal Vaccine: Received Pneumococcal Vaccination: Yes Received Pneumococcal Vaccination On or After 60th Birthday: No Implantable Devices No devices added Family and Social History Cancer: Yes - Maternal Grandparents; Diabetes: Yes - Father,Child; Heart Disease: No; Hereditary Spherocytosis: No; Hypertension: Yes - Paternal Grandparents; Kidney Disease: Yes - Father; Lung Disease: Yes - Maternal Grandparents; Seizures: No; Stroke: No; Thyroid Problems: No; Tuberculosis: No; Never smoker; Marital Status - Separated; Alcohol Use: Rarely; Drug Use: No History; Caffeine Use: Rarely; Financial Concerns: No; Food, Clothing or Shelter Needs: No; Support System Lacking: No; Transportation Concerns: No Electronic Signature(s) Signed: 02/17/2023 12:49:28 PM By: [name] Signed: 02/17/2023 12:58:51 PM By: [name] RN Entered By: [name] on 02/16/2023 09:18:51 -------------------------------------------------------------------------------- SuperBill Details Patient Name: Date of Service: [name] 02/16/2023 Medical Record Number: [id number] Patient Account Number: [id number] Date of Birth/Sex: Treating RN: 1971/01/10 (52 y.o. [name] Primary Care Provider: Maudie [name] Other Clinician: Referring Provider: Treating Provider/Extender: [name] in Treatment: 0 Diagnosis Coding ICD-10 Codes Code Description [phone] Non-pressure chronic ulcer of other part of left lower leg with fat layer exposed I87.312  Chronic venous hypertension (idiopathic) with ulcer of left lower extremity I87.321 Chronic venous hypertension (idiopathic) with inflammation of right

## 2023-06-10 DIAGNOSIS — I871 Compression of vein: Secondary | ICD-10-CM | POA: Diagnosis not present

## 2023-06-10 DIAGNOSIS — T82858A Stenosis of vascular prosthetic devices, implants and grafts, initial encounter: Secondary | ICD-10-CM | POA: Diagnosis not present

## 2023-06-10 DIAGNOSIS — T82898A Other specified complication of vascular prosthetic devices, implants and grafts, initial encounter: Secondary | ICD-10-CM | POA: Diagnosis not present

## 2023-06-10 DIAGNOSIS — N186 End stage renal disease: Secondary | ICD-10-CM | POA: Diagnosis not present

## 2023-06-10 DIAGNOSIS — M1030 Gout due to renal impairment, unspecified site: Secondary | ICD-10-CM | POA: Diagnosis not present

## 2023-06-10 DIAGNOSIS — G4730 Sleep apnea, unspecified: Secondary | ICD-10-CM | POA: Diagnosis not present

## 2023-06-10 DIAGNOSIS — T82838A Hemorrhage of vascular prosthetic devices, implants and grafts, initial encounter: Secondary | ICD-10-CM | POA: Diagnosis not present

## 2023-06-10 DIAGNOSIS — E1122 Type 2 diabetes mellitus with diabetic chronic kidney disease: Secondary | ICD-10-CM | POA: Diagnosis not present

## 2023-06-10 DIAGNOSIS — I120 Hypertensive chronic kidney disease with stage 5 chronic kidney disease or end stage renal disease: Secondary | ICD-10-CM | POA: Diagnosis not present

## 2023-06-11 DIAGNOSIS — N186 End stage renal disease: Secondary | ICD-10-CM | POA: Diagnosis not present

## 2023-06-11 DIAGNOSIS — N2581 Secondary hyperparathyroidism of renal origin: Secondary | ICD-10-CM | POA: Diagnosis not present

## 2023-06-11 DIAGNOSIS — Z992 Dependence on renal dialysis: Secondary | ICD-10-CM | POA: Diagnosis not present

## 2023-06-13 DIAGNOSIS — N186 End stage renal disease: Secondary | ICD-10-CM | POA: Diagnosis not present

## 2023-06-13 DIAGNOSIS — E1022 Type 1 diabetes mellitus with diabetic chronic kidney disease: Secondary | ICD-10-CM | POA: Diagnosis not present

## 2023-06-13 DIAGNOSIS — I872 Venous insufficiency (chronic) (peripheral): Secondary | ICD-10-CM | POA: Diagnosis not present

## 2023-06-13 DIAGNOSIS — L97922 Non-pressure chronic ulcer of unspecified part of left lower leg with fat layer exposed: Secondary | ICD-10-CM | POA: Diagnosis not present

## 2023-06-13 DIAGNOSIS — I890 Lymphedema, not elsewhere classified: Secondary | ICD-10-CM | POA: Diagnosis not present

## 2023-06-13 DIAGNOSIS — E1065 Type 1 diabetes mellitus with hyperglycemia: Secondary | ICD-10-CM | POA: Diagnosis not present

## 2023-06-13 DIAGNOSIS — E1051 Type 1 diabetes mellitus with diabetic peripheral angiopathy without gangrene: Secondary | ICD-10-CM | POA: Diagnosis not present

## 2023-06-13 DIAGNOSIS — I120 Hypertensive chronic kidney disease with stage 5 chronic kidney disease or end stage renal disease: Secondary | ICD-10-CM | POA: Diagnosis not present

## 2023-06-13 DIAGNOSIS — I499 Cardiac arrhythmia, unspecified: Secondary | ICD-10-CM | POA: Diagnosis not present

## 2023-06-14 DIAGNOSIS — N2581 Secondary hyperparathyroidism of renal origin: Secondary | ICD-10-CM | POA: Diagnosis not present

## 2023-06-14 DIAGNOSIS — Z992 Dependence on renal dialysis: Secondary | ICD-10-CM | POA: Diagnosis not present

## 2023-06-14 DIAGNOSIS — N186 End stage renal disease: Secondary | ICD-10-CM | POA: Diagnosis not present

## 2023-06-15 ENCOUNTER — Encounter: Payer: Medicare HMO | Admitting: Internal Medicine

## 2023-06-15 DIAGNOSIS — I120 Hypertensive chronic kidney disease with stage 5 chronic kidney disease or end stage renal disease: Secondary | ICD-10-CM | POA: Diagnosis not present

## 2023-06-15 DIAGNOSIS — Z992 Dependence on renal dialysis: Secondary | ICD-10-CM | POA: Diagnosis not present

## 2023-06-15 DIAGNOSIS — E11622 Type 2 diabetes mellitus with other skin ulcer: Secondary | ICD-10-CM | POA: Diagnosis not present

## 2023-06-15 DIAGNOSIS — E1122 Type 2 diabetes mellitus with diabetic chronic kidney disease: Secondary | ICD-10-CM | POA: Diagnosis not present

## 2023-06-15 DIAGNOSIS — I890 Lymphedema, not elsewhere classified: Secondary | ICD-10-CM | POA: Diagnosis not present

## 2023-06-15 DIAGNOSIS — Z792 Long term (current) use of antibiotics: Secondary | ICD-10-CM | POA: Diagnosis not present

## 2023-06-15 DIAGNOSIS — Z794 Long term (current) use of insulin: Secondary | ICD-10-CM | POA: Diagnosis not present

## 2023-06-15 DIAGNOSIS — L97822 Non-pressure chronic ulcer of other part of left lower leg with fat layer exposed: Secondary | ICD-10-CM | POA: Diagnosis not present

## 2023-06-15 DIAGNOSIS — N186 End stage renal disease: Secondary | ICD-10-CM | POA: Diagnosis not present

## 2023-06-16 ENCOUNTER — Ambulatory Visit
Admission: EM | Admit: 2023-06-16 | Discharge: 2023-06-16 | Disposition: A | Discharge status: Home / Self Care | Payer: Medicare HMO | Attending: Physician Assistant | Admitting: Physician Assistant

## 2023-06-16 DIAGNOSIS — L089 Local infection of the skin and subcutaneous tissue, unspecified: Secondary | ICD-10-CM

## 2023-06-16 DIAGNOSIS — N2581 Secondary hyperparathyroidism of renal origin: Secondary | ICD-10-CM | POA: Diagnosis not present

## 2023-06-16 DIAGNOSIS — L729 Follicular cyst of the skin and subcutaneous tissue, unspecified: Secondary | ICD-10-CM | POA: Diagnosis not present

## 2023-06-16 DIAGNOSIS — N186 End stage renal disease: Secondary | ICD-10-CM | POA: Diagnosis not present

## 2023-06-16 DIAGNOSIS — Z992 Dependence on renal dialysis: Secondary | ICD-10-CM | POA: Diagnosis not present

## 2023-06-16 MED ORDER — DOXYCYCLINE HYCLATE 100 MG PO CAPS: 100.0000 mg | ORAL_CAPSULE | Freq: Two times a day (BID) | ORAL | 0 refills | Status: AC

## 2023-06-16 MED ORDER — DOXYCYCLINE HYCLATE 100 MG PO CAPS: 100.0000 mg | ORAL_CAPSULE | Freq: Two times a day (BID) | ORAL | 0 refills | Status: DC

## 2023-06-16 NOTE — ED Triage Notes (Signed)
Patient has a rea on the back of his neck. Unsure if its a spider bite. Noticed area on 10/8 . Tried to express the area and got puss.

## 2023-06-16 NOTE — ED Provider Notes (Signed)
[facility]    CSN: [id number] Arrival date & time: 06/16/23  1145      History   Chief Complaint Chief Complaint  Patient presents with   Abscess    HPI [name] is a 52 y.o. male with a history of anemia, ESRD on dialysis, type I diabetes, hypertension, chronic venous insufficiency.  Patient presents today for 9-day history of an area of swelling of the back of his neck.  He has pressed on the area and had pustular and bloody discharge.  The area is sore.  He thinks something could have bitten him but he is not sure.  He has been applying warm compresses.  He denies associated fever, neck stiffness, swollen lymph nodes.  History of staph infection.  HPI  Past Medical History:  Diagnosis Date   Anemia    Chronic kidney disease    14% FUNCTION   Diabetes mellitus without complication (HCC)    Diabetic retinopathy (HCC)    Dyspnea    DOE   Dysrhythmia    End stage renal disease (HCC)    History of orthopnea    error wrong patietn   Hypercholesteremia    error   Hyperkalemia    error wrong patient   Hypertension    Long-term insulin use (HCC)    Lymphedema of leg    Metabolic encephalopathy    error   Microalbuminuria    MSSA (methicillin susceptible Staphylococcus aureus)    Obesity    PVD (peripheral vascular disease) (HCC)    Respiratory failure (HCC)    Error   Sepsis (HCC)    error   Sleep apnea    no CPAP   UTI (urinary tract infection)    error   Venous ulcer (HCC)    Vitamin D deficiency     Patient Active Problem List   Diagnosis Date Noted   Endocarditis 03/29/2023   Bacteremia due to Streptococcus 03/28/2023   Sepsis (HCC) 03/24/2023   HLD (hyperlipidemia) 03/24/2023   HTN (hypertension) 03/24/2023   COPD (chronic obstructive pulmonary disease) (HCC) 03/24/2023   Leg wound, left 03/24/2023   Venous ulcer of ankle (HCC) 02/08/2023   Gangrene of lower extremity (HCC) 11/10/2022   Reactive airway disease 11/10/2022    Diabetic ulcer of lower leg (HCC) 09/27/2022   COVID-19 virus infection 09/27/2022   Low testosterone 02/01/2021   Screening for malignant neoplasm of prostate 01/28/2021   Hyperprolactinemia (HCC) 07/03/2020   Gout 08/16/2019   End-stage renal disease (HCC) 06/07/2019   Complication of vascular access for dialysis 06/07/2019   Diabetic retinopathy of both eyes (HCC) 04/12/2019   Obesity, Class III, BMI 40-49.9 (morbid obesity) (HCC) 04/12/2019   AVF (arteriovenous fistula) (HCC) 02/22/2019   Hyperkalemia 02/20/2019   End stage renal disease (HCC) 05/29/2018   Venous ulcer of ankle, left (HCC) 05/29/2018   Lymphedema 11/03/2017   Cellulitis of left lower extremity 10/01/2017   Chronic venous insufficiency 10/01/2017   Hypertensive disorder 10/01/2017   Hypertension 09/20/2017   Diabetes mellitus with hyperglycemia (HCC) 09/20/2017   Swelling of limb 09/20/2017   CKD (chronic kidney disease) stage 5, GFR less than 15 ml/min (HCC) 08/30/2017   PVD (peripheral vascular disease) (HCC) 06/18/2014   Long-term insulin use (HCC) 06/13/2014   Microalbuminuria 03/12/2014   Type 1 diabetes mellitus (HCC) 08/31/2003    Past Surgical History:  Procedure Laterality Date   A/V FISTULAGRAM Right 10/24/2018   Procedure: A/V FISTULAGRAM;  Surgeon: Renford Dills, MD;  Location: [facility];  Service: Cardiovascular;  Laterality: Right;   A/V FISTULAGRAM Right 11/24/2018   Procedure: A/V FISTULAGRAM;  Surgeon: Renford Dills, MD;  Location: [facility];  Service: Cardiovascular;  Laterality: Right;   A/V FISTULAGRAM Left 07/31/2019   Procedure: A/V FISTULAGRAM;  Surgeon: Renford Dills, MD;  Location: [facility];  Service: Cardiovascular;  Laterality: Left;   A/V FISTULAGRAM Left 12/11/2019   Procedure: A/V FISTULAGRAM;  Surgeon: Renford Dills, MD;  Location: [facility];  Service: Cardiovascular;  Laterality: Left;   A/V SHUNT INTERVENTION Right  02/21/2019   Procedure: A/V SHUNT INTERVENTION;  Surgeon: Renford Dills, MD;  Location: [facility];  Service: Cardiovascular;  Laterality: Right;   APPLICATION OF WOUND VAC Left 10/03/2017   Procedure: APPLICATION OF WOUND VAC;  Surgeon: Annice Needy, MD;  Location: [facility];  Service: General;  Laterality: Left;   APPLICATION OF WOUND VAC Left 10/11/2017   Procedure: APPLICATION OF WOUND VAC;  Surgeon: Renford Dills, MD;  Location: [facility];  Service: Vascular;  Laterality: Left;   APPLICATION OF WOUND VAC Left 10/14/2017   Procedure: WOUND VAC CHANGE;  Surgeon: Renford Dills, MD;  Location: [facility];  Service: Vascular;  Laterality: Left;  left lower leg   APPLICATION OF WOUND VAC Left 10/18/2017   Procedure: WOUND VAC CHANGE;  Surgeon: Renford Dills, MD;  Location: [facility];  Service: Vascular;  Laterality: Left;   APPLICATION OF WOUND VAC Left 10/07/2017   Procedure: APPLICATION OF WOUND VAC;  Surgeon: Renford Dills, MD;  Location: [facility];  Service: Vascular;  Laterality: Left;   APPLICATION OF WOUND VAC Left 11/17/2022   Procedure: Wound vac exchange;  Surgeon: Renford Dills, MD;  Location: [facility];  Service: Vascular;  Laterality: Left;   APPLICATION OF WOUND VAC Left 11/10/2022   Procedure: APPLICATION OF WOUND VAC;  Surgeon: Renford Dills, MD;  Location: [facility];  Service: Vascular;  Laterality: Left;   APPLICATION OF WOUND VAC Left 11/24/2022   Procedure: WOUND VAC REMOVAL;  Surgeon: Renford Dills, MD;  Location: [facility];  Service: Vascular;  Laterality: Left;   APPLICATION OF WOUND VAC Left 11/12/2022   Procedure: APPLICATION OF WOUND VAC;  Surgeon: Renford Dills, MD;  Location: [facility];  Service: Vascular;  Laterality: Left;   APPLICATION OF WOUND VAC Left 01/06/2023   Procedure: APPLICATION OF WOUND VAC;  Surgeon: Renford Dills, MD;  Location: [facility];  Service: Vascular;  Laterality: Left;   AV FISTULA INSERTION W/ RF MAGNETIC  GUIDANCE Right 07/19/2018   Procedure: AV FISTULA INSERTION W/RF MAGNETIC GUIDANCE;  Surgeon: Renford Dills, MD;  Location: [facility];  Service: Cardiovascular;  Laterality: Right;   AV FISTULA PLACEMENT Left 05/11/2019   Procedure: ARTERIOVENOUS (AV) FISTULA CREATION (RADIOCEPHALIC );  Surgeon: Renford Dills, MD;  Location: [facility];  Service: Vascular;  Laterality: Left;   AV FISTULA PLACEMENT Left 02/20/2020   Procedure: INSERTION OF ARTERIOVENOUS (AV) GORE-TEX GRAFT LEFT ARM ( FOREARM LOOP );  Surgeon: Renford Dills, MD;  Location: [facility];  Service: Vascular;  Laterality: Left;   CATARACT EXTRACTION W/PHACO Left 11/01/2018   Procedure: CATARACT EXTRACTION PHACO AND INTRAOCULAR LENS PLACEMENT (IOC)-LEFT, DIABETIC-INSULIN DEPENDENT;  Surgeon: Nevada Crane, MD;  Location: [facility];  Service: Ophthalmology;  Laterality: Left;  [place] 00:41.8 CDE 4.61 Fluid Pack Lot # [id number]   CATARACT EXTRACTION W/PHACO  Right 04/27/2019   Procedure: CATARACT EXTRACTION PHACO AND INTRAOCULAR LENS PLACEMENT (IOC);  Surgeon: Nevada Crane, MD;  Location: [facility];  Service: Ophthalmology;  Laterality: Right;  [place] 00:34 CDE 1.97 FLUID PACK LOT # [id number]    COLONOSCOPY WITH PROPOFOL N/A 05/12/2020   Procedure: COLONOSCOPY WITH PROPOFOL;  Surgeon: Toledo, Boykin Nearing, MD;  Location: [facility];  Service: Gastroenterology;  Laterality: N/A;   DIALYSIS/PERMA CATHETER INSERTION N/A 02/23/2019   Procedure: DIALYSIS/PERMA CATHETER INSERTION;  Surgeon: Annice Needy, MD;  Location: [facility];  Service: Cardiovascular;  Laterality: N/A;   DIALYSIS/PERMA CATHETER REMOVAL N/A 04/03/2020   Procedure: DIALYSIS/PERMA CATHETER REMOVAL;  Surgeon: Renford Dills, MD;  Location: [facility];  Service: Cardiovascular;  Laterality: N/A;   DIALYSIS/PERMA CATHETER REMOVAL N/A 02/03/2022   Procedure: DIALYSIS/PERMA CATHETER REMOVAL;  Surgeon: Renford Dills, MD;  Location: [facility];  Service: Cardiovascular;  Laterality: N/A;   I & D EXTREMITY Left 10/11/2017   Procedure: IRRIGATION AND DEBRIDEMENT EXTREMITY;  Surgeon: Renford Dills, MD;  Location: [facility];  Service: Vascular;  Laterality: Left;   INCISION AND DRAINAGE ABSCESS Right 10/07/2017   Procedure: INCISION AND DRAINAGE ABSCESS;  Surgeon: Renford Dills, MD;  Location: [facility];  Service: Vascular;  Laterality: Right;   INCISION AND DRAINAGE OF WOUND Left 11/17/2022   Procedure: DEBRIDEMENT WOUND;  Surgeon: Renford Dills, MD;  Location: [facility];  Service: Vascular;  Laterality: Left;   INCISION AND DRAINAGE OF WOUND Left 11/24/2022   Procedure: DEBRIDEMENT WOUND LEFT LOWER EXTREMITY;  Surgeon: Renford Dills, MD;  Location: [facility];  Service: Vascular;  Laterality: Left;   IRRIGATION AND DEBRIDEMENT ABSCESS Left 10/03/2017   Procedure: IRRIGATION AND DEBRIDEMENT ABSCESS with debridement of skin, soft tissue, muscle 50sq cm;  Surgeon: Annice Needy, MD;  Location: [facility];  Service: General;  Laterality: Left;   LOWER EXTREMITY ANGIOGRAPHY Left 10/15/2022   Procedure: Lower Extremity Angiography;  Surgeon: Renford Dills, MD;  Location: [facility];  Service: Cardiovascular;  Laterality: Left;   TEE WITHOUT CARDIOVERSION N/A 03/29/2023   Procedure: TRANSESOPHAGEAL ECHOCARDIOGRAM;  Surgeon: Antonieta Iba, MD;  Location: [facility];  Service: Cardiovascular;  Laterality: N/A;   TEMPORARY DIALYSIS CATHETER  02/20/2019   Procedure: TEMPORARY DIALYSIS CATHETER;  Surgeon: Renford Dills, MD;  Location: [facility];  Service: Cardiovascular;;   UPPER EXTREMITY ANGIOGRAPHY Left 09/18/2019   Procedure: UPPER EXTREMITY ANGIOGRAPHY;  Surgeon: Renford Dills, MD;  Location: [facility];  Service: Cardiovascular;  Laterality: Left;   WOUND DEBRIDEMENT Left 11/10/2022   Procedure: DEBRIDEMENT WOUND;  Surgeon: Renford Dills, MD;  Location: [facility];  Service:  Vascular;  Laterality: Left;   WOUND DEBRIDEMENT Left 11/12/2022   Procedure: DEBRIDEMENT WOUND;  Surgeon: Renford Dills, MD;  Location: [facility];  Service: Vascular;  Laterality: Left;   WOUND DEBRIDEMENT Left 01/06/2023   Procedure: DEBRIDEMENT WOUND;  Surgeon: Renford Dills, MD;  Location: [facility];  Service: Vascular;  Laterality: Left;       Home Medications    Prior to Admission medications   Medication Sig Start Date End Date Taking? Authorizing Provider  allopurinol (ZYLOPRIM) 100 MG tablet Take 100 mg by mouth daily.   Yes [provider]  amLODipine (NORVASC) 10 MG tablet Take 5 mg by mouth daily as needed (if bp is 190/90 or higher).    Yes [provider]  apixaban (ELIQUIS) 5 MG TABS tablet Take 1 tablet (5 mg total) by mouth 2 (two) times daily. 11/25/22  Yes Pace, Brien R, NP  atenolol (TENORMIN) 50 MG tablet Take 25 mg by mouth daily as needed (if bp is 190/90 or higher).    Yes [provider]  atorvastatin (LIPITOR) 80 MG tablet Take 80 mg by mouth at bedtime. 03/15/23  Yes [provider]  cinacalcet (SENSIPAR) 30 MG tablet Take 30 mg by mouth daily.   Yes [provider]  clopidogrel (PLAVIX) 75 MG tablet TAKE 1 TABLET BY MOUTH EVERY DAY 05/23/23  Yes Georgiana Spinner, NP  Continuous Glucose Sensor (DEXCOM G7 SENSOR) MISC USE 1 EACH EVERY 10 (TEN) DAYS 11/17/22  Yes [provider]  doxycycline (VIBRAMYCIN) 100 MG capsule Take 1 capsule (100 mg total) by mouth 2 (two) times daily for 7 days. 06/16/23 06/23/23 Yes Shirlee Latch, PA-C  Insulin Glargine (LANTUS SOLOSTAR) 100 UNIT/ML Solostar Pen Inject 20 Units into the skin at bedtime.   Yes [provider]  sildenafil (REVATIO) 20 MG tablet Take 1 tablet daily.  Do not take with nitrates. 01/17/23  Yes McGowan, Carollee Herter A, PA-C  traZODone (DESYREL) 50 MG tablet Take 50 mg by mouth at bedtime. 11/04/22  Yes [provider]  ACCU-CHEK GUIDE test strip  USE 1 STRIP TOTAL 2 TIMES DAILY FOR 90 DAYS 01/10/23   [provider]  acetaminophen (TYLENOL) 325 MG tablet Take 2 tablets (650 mg total) by mouth every 6 (six) hours as needed for mild pain (or Fever >/= 101). 10/05/22   Lurene Shadow, MD  B Complex-C-Folic Acid (RENA-VITE RX) 1 MG TABS Take 1 tablet by mouth 3 (three) times a week.  08/30/19   [provider]  calcium acetate (PHOSLO) 667 MG capsule Take 1,334-2,001 mg by mouth See admin instructions. Take 2001 mg by mouth with each meal & take 1334 mg by mouth with each snack. 01/23/19   [provider]  CINNAMON PO Take 500 mg by mouth 2 (two) times daily.     [provider]  lidocaine-prilocaine (EMLA) cream Apply 1 application topically as needed (port access).    [provider]  Melatonin 5 MG CAPS Take 1 capsule by mouth at bedtime as needed.    [provider]  Multiple Vitamin (MULTIVITAMIN WITH MINERALS) TABS tablet Take 1 tablet by mouth 3 (three) times a week. Men's Multivitamin    [provider]  Multiple Vitamins-Minerals (EYE HEALTH PO) Take 1 capsule by mouth daily.     [provider]  Polyethyl Glycol-Propyl Glycol (SYSTANE OP) Place 1 drop into both eyes daily as needed (dry eyes).    [provider]    Family History Family History  Problem Relation Age of Onset   Diabetes Father    Kidney disease Father    Diabetes Daughter     Social History Social History   Tobacco Use   Smoking status: Former    Types: Cigars    Passive exposure: Past   Smokeless tobacco: Former    Quit date: 09/30/2017   Tobacco comments:    occasional  Vaping Use   Vaping status: Never Used  Substance Use Topics   Alcohol use: Yes    Alcohol/week: 1.0 standard drink of alcohol    Types: 1 Cans of beer per week    Comment: beer   Drug use: Not Currently     Allergies   Lisinopril, Furosemide, Latex,  Silicone, and Adhesive [tape]   Review of  Systems Review of Systems  Constitutional:  Negative for fatigue and fever.  Musculoskeletal:  Negative for neck pain.  Skin:  Positive for color change. Negative for wound.  Neurological:  Negative for weakness.  Hematological:  Negative for adenopathy.     Physical Exam Triage Vital Signs ED Triage Vitals  Encounter Vitals Group     BP 06/16/23 1203 (!) 138/93     Systolic BP Percentile --      Diastolic BP Percentile --      Pulse Rate 06/16/23 1203 81     Resp 06/16/23 1203 19     Temp 06/16/23 1203 98.6 F (37 C)     Temp Source 06/16/23 1203 Oral     SpO2 06/16/23 1203 100 %     Weight --      Height --      Head Circumference --      Peak Flow --      Pain Score 06/16/23 1201 0     Pain Loc --      Pain Education --      Exclude from Growth Chart --    No data found.  Updated Vital Signs BP (!) 138/93 (BP Location: Right Arm)   Pulse 81   Temp 98.6 F (37 C) (Oral)   Resp 19   SpO2 100%      Physical Exam Vitals and nursing note reviewed.  Constitutional:      General: He is not in acute distress.    Appearance: Normal appearance. He is well-developed. He is not ill-appearing.  HENT:     Head: Normocephalic and atraumatic.  Eyes:     General: No scleral icterus.    Conjunctiva/sclera: Conjunctivae normal.  Cardiovascular:     Rate and Rhythm: Normal rate and regular rhythm.  Pulmonary:     Effort: Pulmonary effort is normal. No respiratory distress.     Breath sounds: Normal breath sounds.  Abdominal:     Palpations: Abdomen is soft.  Musculoskeletal:     Cervical back: Neck supple.  Skin:    General: Skin is warm and dry.     Capillary Refill: Capillary refill takes less than 2 seconds.     Findings: Lesion (2 cm x 1.5 cm area of induration w/o fluctuance of posterior neck. There is a 1 cm x 1 cm area of induration below that with opening and bloody/pustular mild drainage.) present.  Neurological:     General: No focal deficit present.      Mental Status: He is alert. Mental status is at baseline.     Motor: No weakness.     Gait: Gait normal.  Psychiatric:        Mood and Affect: Mood normal.        Behavior: Behavior normal.      UC Treatments / Results  Labs (all labs ordered are listed, but only abnormal results are displayed) Labs Reviewed - No data to display  EKG   Radiology No results found.  Procedures Procedures (including critical care time)  Medications Ordered in UC Medications - No data to display  Initial Impression / Assessment and Plan / UC Course  I have reviewed the triage vital signs and the nursing notes.  Pertinent labs & imaging results that were available during my care of the patient were reviewed by me and considered in my medical decision making (see chart for details).   52 year old male  with history of ESRD on dialysis, hypertension, type 1 diabetes presents for 9-day history of an area of swelling and discomfort of the posterior neck.  On exam he has 2 separate lesions, 1 is 2 cm x 1-1/2 cm.  It is indurated but not fluctuant.  No drainage.  There is a small lesion below this which is 1 cm x 1 cm area of induration without fluctuance.  This does have bloody/purulent minimal drainage.  Both areas are tender to palpation.  Clinical presentation consistent with suspected infected epidermoid cyst versus abscess.  Will treat this time with doxycycline especially given his history of staph infection.  Also advised warm compresses and supportive care.  Reviewed return and ER precautions.   Final Clinical Impressions(s) / UC Diagnoses   Final diagnoses:  Infected cyst of skin     Discharge Instructions      -Start the antibiotic. - Apply warm compresses to the neck multiple times throughout the day.  This may cause the area to open and drain. - If symptoms are not improving over the next week or they worsen he should be seen again. - We discussed you cleaning your ears out at home  with your kit.  You may purchase over-the-counter Debrox eardrops to soften the wax before you flush it.   ED Prescriptions     Medication Sig Dispense Auth. Provider   doxycycline (VIBRAMYCIN) 100 MG capsule Take 1 capsule (100 mg total) by mouth 2 (two) times daily for 7 days. 14 capsule Shirlee Latch, PA-C      PDMP not reviewed this encounter.   Shirlee Latch, PA-C 06/16/23 1230

## 2023-06-16 NOTE — Discharge Instructions (Addendum)
-  Start the antibiotic. - Apply warm compresses to the neck multiple times throughout the day.  This may cause the area to open and drain. - If symptoms are not improving over the next week or they worsen he should be seen again. - We discussed you cleaning your ears out at home with your kit.  You may purchase over-the-counter Debrox eardrops to soften the wax before you flush it.

## 2023-06-17 DIAGNOSIS — I872 Venous insufficiency (chronic) (peripheral): Secondary | ICD-10-CM | POA: Diagnosis not present

## 2023-06-17 DIAGNOSIS — I890 Lymphedema, not elsewhere classified: Secondary | ICD-10-CM | POA: Diagnosis not present

## 2023-06-17 DIAGNOSIS — I499 Cardiac arrhythmia, unspecified: Secondary | ICD-10-CM | POA: Diagnosis not present

## 2023-06-17 DIAGNOSIS — E1051 Type 1 diabetes mellitus with diabetic peripheral angiopathy without gangrene: Secondary | ICD-10-CM | POA: Diagnosis not present

## 2023-06-17 DIAGNOSIS — E1065 Type 1 diabetes mellitus with hyperglycemia: Secondary | ICD-10-CM | POA: Diagnosis not present

## 2023-06-17 DIAGNOSIS — I120 Hypertensive chronic kidney disease with stage 5 chronic kidney disease or end stage renal disease: Secondary | ICD-10-CM | POA: Diagnosis not present

## 2023-06-17 DIAGNOSIS — E1022 Type 1 diabetes mellitus with diabetic chronic kidney disease: Secondary | ICD-10-CM | POA: Diagnosis not present

## 2023-06-17 DIAGNOSIS — L97922 Non-pressure chronic ulcer of unspecified part of left lower leg with fat layer exposed: Secondary | ICD-10-CM | POA: Diagnosis not present

## 2023-06-17 DIAGNOSIS — N186 End stage renal disease: Secondary | ICD-10-CM | POA: Diagnosis not present

## 2023-06-17 NOTE — Progress Notes (Signed)
2.5x2.5 (in/in) 3 x Per Week/30 Days ary Discharge Instructions: Apply Hydrofera Blue Ready to wound bed as directed Secondary Dressing: Zetuvit Plus 4x4 (in/in) 3 x Per Week/30 Days Secured With: Dole Food Dressing, Latex-free, Size 5, Small-Head / Shoulder / Thigh 3 x Per Week/30 Days Discharge Instructions: over wrap for comfort Compression Wrap: Urgo K2, two layer compression system, large 3 x Per Week/30 Days Electronic Signature(s) Signed: 06/15/2023 4:42:07 PM By: Baltazar Najjar MD Signed: 06/17/2023 11:41:18 AM By: [name] RN Entered By: [name] on 06/15/2023 05:23:03 -------------------------------------------------------------------------------- Problem List Details Patient Name: Date of Service: [name], CO SLO W [name] 06/15/2023 8:00 [name] [name] Medical Record Number: [id number] Patient Account Number: [id number] Date of Birth/Sex: Treating RN: 1970-11-08 (52 y.o. [name] [name] Primary Care Provider: [name] Other Clinician: Referring Provider: Treating Provider/Extender: RO BSO N, [name] [name] in Treatment: 17 Active Problems ICD-10 Encounter Code Description Active Date MDM Diagnosis L97.822 Non-pressure chronic ulcer of other part of left lower leg with fat layer exposed6/19/2024 No Yes [name] [name], [name] [name] ([id number]) [id number] Page 6 of 11 I87.312 Chronic venous hypertension (idiopathic) with ulcer of left lower extremity 02/16/2023 No Yes I89.0 Lymphedema, not elsewhere classified 03/23/2023 No Yes I87.321 Chronic venous hypertension (idiopathic) with  inflammation of right lower 02/16/2023 No Yes extremity E11.622 Type 2 diabetes mellitus with other skin ulcer 02/16/2023 No Yes Inactive Problems ICD-10 Code Description Active Date Inactive Date L97.522 Non-pressure chronic ulcer of other part of left foot with fat layer exposed 03/16/2023 03/16/2023 Resolved Problems Electronic Signature(s) Signed: 06/15/2023 4:42:07 PM By: Baltazar Najjar MD Entered By: Baltazar Najjar on 06/15/2023 05:32:55 -------------------------------------------------------------------------------- Progress Note Details Patient Name: Date of Service: [name], CO SLO W [name] 06/15/2023 8:00 [name] [name] Medical Record Number: [id number] Patient Account Number: [id number] Date of Birth/Sex: Treating RN: [birth year] (52 y.o. [name] [name] Primary Care Provider: [name] Other Clinician: Referring Provider: Treating Provider/Extender: RO BSO N, [name] [name] in Treatment: 17 Subjective History of Present Illness (HPI) 12/28/17; this is [name] now 51 year old man who is [name] type II diabetic. He was hospitalized from 10/01/17 through 10/19/17. He had an MSSA soft tissue and skin infection. 2 open areas on the left leg were identified he has [name] smaller area on the left medial calf superiorly just below the knee and [name] wound just above the left ankle on the posterior medial aspect. I think both of these were surgical IandD sites when he was in the hospital. He was discharged with [name] wound VAC at that point however this is since been taken off. He follows with Dr. Sampson Goon for the [facility] and he is still on chronic Keflex at 500 twice [name] day. At that time he was hospitalized his hemoglobin A1c was 15.1 however if I'[name] reading his endocrinologist notes correctly that is improved. He has been following with Dr. Lorretta Harp [facility] and he has been applying calcium alginate and Unna boots. He has home health changing the dressing. They have also been attempting to get him  external compression pumps although the patient is unaware whether they've been approved by insurance at this point. as mentioned he has [name] smaller clean wound on the right lateral calf just below the knee and he has [name] much larger area just above the left ankle medially and posteriorly. Our intake nurse reported greenish purulent looking drainage.the patient did have surgical material sent to pathology in February. This showed chronic abscess The patient  2.5x2.5 (in/in) 3 x Per Week/30 Days ary Discharge Instructions: Apply Hydrofera Blue Ready to wound bed as directed Secondary Dressing: Zetuvit Plus 4x4 (in/in) 3 x Per Week/30 Days Secured With: [employer] Dressing, Latex-free, Size 5, Small-Head / Shoulder / Thigh 3 x Per Week/30 Days Discharge Instructions: over wrap for comfort Compression Wrap: Urgo K2, two layer compression system, large 3 x Per Week/30 Days Electronic Signature(s) Signed: 06/15/2023 4:42:07 PM By: Baltazar Najjar MD Signed: 06/17/2023 11:41:18 AM By: [name] RN Entered By: [name] on 06/15/2023 05:23:03 -------------------------------------------------------------------------------- Problem List Details Patient Name: Date of Service: [name], CO SLO W [name] 06/15/2023 8:00 [name] [name] Medical Record Number: [id number] Patient Account Number: [id number] Date of Birth/Sex: Treating RN: 1970-11-08 (52 y.o. [name] [name] Primary Care Provider: [name] Other Clinician: Referring Provider: Treating Provider/Extender: RO BSO N, [name] [name] in Treatment: 17 Active Problems ICD-10 Encounter Code Description Active Date MDM Diagnosis L97.822 Non-pressure chronic ulcer of other part of left lower leg with fat layer exposed6/19/2024 No Yes [name] [name], [name] [name] ([id number]) [id number] Page 6 of 11 I87.312 Chronic venous hypertension (idiopathic) with ulcer of left lower extremity 02/16/2023 No Yes I89.0 Lymphedema, not elsewhere classified 03/23/2023 No Yes I87.321 Chronic venous hypertension (idiopathic) with  inflammation of right lower 02/16/2023 No Yes extremity E11.622 Type 2 diabetes mellitus with other skin ulcer 02/16/2023 No Yes Inactive Problems ICD-10 Code Description Active Date Inactive Date L97.522 Non-pressure chronic ulcer of other part of left foot with fat layer exposed 03/16/2023 03/16/2023 Resolved Problems Electronic Signature(s) Signed: 06/15/2023 4:42:07 PM By: Baltazar Najjar MD Entered By: Baltazar Najjar on 06/15/2023 05:32:55 -------------------------------------------------------------------------------- Progress Note Details Patient Name: Date of Service: [name], CO SLO W [name] 06/15/2023 8:00 [name] [name] Medical Record Number: [id number] Patient Account Number: [id number] Date of Birth/Sex: Treating RN: [birth year] (52 y.o. [name] [name] Primary Care Provider: [name] Other Clinician: Referring Provider: Treating Provider/Extender: RO BSO N, [name] [name] in Treatment: 17 Subjective History of Present Illness (HPI) 12/28/17; this is [name] now 51 year old man who is [name] type II diabetic. He was hospitalized from 10/01/17 through 10/19/17. He had an MSSA soft tissue and skin infection. 2 open areas on the left leg were identified he has [name] smaller area on the left medial calf superiorly just below the knee and [name] wound just above the left ankle on the posterior medial aspect. I think both of these were surgical IandD sites when he was in the hospital. He was discharged with [name] wound VAC at that point however this is since been taken off. He follows with Dr. Sampson Goon for the [facility] and he is still on chronic Keflex at 500 twice [name] day. At that time he was hospitalized his hemoglobin A1c was 15.1 however if I'[name] reading his endocrinologist notes correctly that is improved. He has been following with Dr. Lorretta Harp at [facility] and he has been applying calcium alginate and Unna boots. He has home health changing the dressing. They have also been attempting to get him  external compression pumps although the patient is unaware whether they've been approved by insurance at this point. as mentioned he has [name] smaller clean wound on the right lateral calf just below the knee and he has [name] much larger area just above the left ankle medially and posteriorly. Our intake nurse reported greenish purulent looking drainage.the patient did have surgical material sent to pathology in February. This showed chronic abscess The patient  in 1 week. Home Health: Home Health Company: - [facility] for wound care. May utilize formulary equivalent dressing for wound treatment orders unless otherwise specified. Home Health Nurse may visit PRN to address patients wound care needs. [name] [phone] Change on Monday and Friday. Wound Care Center will change on Wednesday Scheduled days for dressing changes to be completed; exception, patient has scheduled wound care visit that day. **Please direct any NON-WOUND related issues/requests for orders to patient's Primary Care Physician. **If current dressing causes regression in wound condition, may D/C ordered dressing product/s and apply Normal Saline Moist Dressing daily until next Wound Healing Center or Other MD appointment. **Notify Wound Healing Center of regression in wound condition at [phone]. Bathing/ Shower/ Hygiene: May shower with wound dressing protected with water repellent cover or cast protector. No tub bath. Anesthetic (Use 'Patient Medications' Section for Anesthetic Order Entry): Lidocaine applied to wound bed Edema Control - Lymphedema / Segmental Compressive Device /  Other: Optional: One layer of unna paste to top of compression wrap (to act as an anchor). Tubigrip double layer applied - right leg Elevate, Exercise Daily and Avoid Standing for Long Periods of Time. Elevate legs to the level of the heart and pump ankles as often as possible Elevate leg(s) parallel to the floor when sitting. WOUND #22: - Lower Leg Wound Laterality: Left Cleanser: Soap and Water 3 x Per Week/30 Days Discharge Instructions: Gently cleanse wound with antibacterial soap, rinse and pat dry prior to dressing wounds Cleanser: Wound Cleanser 3 x Per Week/30 Days Discharge Instructions: Wash your hands with soap and water. Remove old dressing, discard into plastic bag and place into trash. Cleanse the wound with Wound Cleanser prior to applying [name] clean dressing using gauze sponges, not tissues or cotton balls. Do not scrub or use excessive force. Pat dry using gauze sponges, not tissue or cotton balls. Prim Dressing: Hydrofera Blue Ready Transfer Foam, 2.5x2.5 (in/in) 3 x Per Week/30 Days ary Discharge Instructions: Apply Hydrofera Blue Ready to wound bed as directed Secondary Dressing: Zetuvit Plus 4x4 (in/in) 3 x Per Week/30 Days Secured With: [employer] Food Dressing, Latex-free, Size 5, Small-Head / Shoulder / Thigh 3 x Per Week/30 Days Discharge Instructions: over wrap for comfort Com pression Wrap: Urgo K2, two layer compression system, large 3 x Per Week/30 Days 1. The wound is smaller. No need to change the primary dressing which is Hydrofera Blue ABDs under and Urgo K2 2. I have talked to him about using his compression pump pumps both now and after he heals for wound prophylaxis 3. We will need to ask about his stockings perhaps as early as next week Electronic Signature(s) Signed: 06/15/2023 4:42:07 PM By: Baltazar Najjar MD Entered By: Baltazar Najjar on 06/15/2023 05:36:10 -------------------------------------------------------------------------------- SuperBill  Details Patient Name: Date of Service: [name], CO SLO W [name] 06/15/2023 Medical Record Number: [id number] Patient Account Number: [id number] Date of Birth/Sex: Treating RN: [birth year] (52 y.o. [name] [name] Primary Care Provider: [name] Other Clinician: Referring Provider: Treating Provider/Extender: RO BSO [name], [name] [name] in Treatment: [address], [place] [name] ([id number]) [id number] Page 11 of 11 Diagnosis Coding ICD-10 Codes Code Description [phone] Non-pressure chronic ulcer of other part of left lower leg with fat layer exposed I87.312 Chronic venous hypertension (idiopathic) with ulcer of left lower extremity I89.0 Lymphedema, not elsewhere classified I87.321 Chronic venous hypertension (idiopathic) with inflammation of right lower extremity E11.622 Type 2 diabetes mellitus with other skin ulcer Facility Procedures :  in 1 week. Home Health: Home Health Company: - [facility] for wound care. May utilize formulary equivalent dressing for wound treatment orders unless otherwise specified. Home Health Nurse may visit PRN to address patients wound care needs. [name] [phone] Change on Monday and Friday. Wound Care Center will change on Wednesday Scheduled days for dressing changes to be completed; exception, patient has scheduled wound care visit that day. **Please direct any NON-WOUND related issues/requests for orders to patient's Primary Care Physician. **If current dressing causes regression in wound condition, may D/C ordered dressing product/s and apply Normal Saline Moist Dressing daily until next Wound Healing Center or Other MD appointment. **Notify Wound Healing Center of regression in wound condition at [phone]. Bathing/ Shower/ Hygiene: May shower with wound dressing protected with water repellent cover or cast protector. No tub bath. Anesthetic (Use 'Patient Medications' Section for Anesthetic Order Entry): Lidocaine applied to wound bed Edema Control - Lymphedema / Segmental Compressive Device /  Other: Optional: One layer of unna paste to top of compression wrap (to act as an anchor). Tubigrip double layer applied - right leg Elevate, Exercise Daily and Avoid Standing for Long Periods of Time. Elevate legs to the level of the heart and pump ankles as often as possible Elevate leg(s) parallel to the floor when sitting. WOUND #22: - Lower Leg Wound Laterality: Left Cleanser: Soap and Water 3 x Per Week/30 Days Discharge Instructions: Gently cleanse wound with antibacterial soap, rinse and pat dry prior to dressing wounds Cleanser: Wound Cleanser 3 x Per Week/30 Days Discharge Instructions: Wash your hands with soap and water. Remove old dressing, discard into plastic bag and place into trash. Cleanse the wound with Wound Cleanser prior to applying [name] clean dressing using gauze sponges, not tissues or cotton balls. Do not scrub or use excessive force. Pat dry using gauze sponges, not tissue or cotton balls. Prim Dressing: Hydrofera Blue Ready Transfer Foam, 2.5x2.5 (in/in) 3 x Per Week/30 Days ary Discharge Instructions: Apply Hydrofera Blue Ready to wound bed as directed Secondary Dressing: Zetuvit Plus 4x4 (in/in) 3 x Per Week/30 Days Secured With: D[employer] Food Dressing, Latex-free, Size 5, Small-Head / Shoulder / Thigh 3 x Per Week/30 Days Discharge Instructions: over wrap for comfort Com pression Wrap: Urgo K2, two layer compression system, large 3 x Per Week/30 Days 1. The wound is smaller. No need to change the primary dressing which is Hydrofera Blue ABDs under and Urgo K2 2. I have talked to him about using his compression pump pumps both now and after he heals for wound prophylaxis 3. We will need to ask about his stockings perhaps as early as next week Electronic Signature(s) Signed: 06/15/2023 4:42:07 PM By: Baltazar Najjar MD Entered By: Baltazar Najjar on 06/15/2023 05:36:10 -------------------------------------------------------------------------------- SuperBill  Details Patient Name: Date of Service: [name], CO SLO W [name] 06/15/2023 Medical Record Number: [id number] Patient Account Number: [id number] Date of Birth/Sex: Treating RN: [birth year] (52 y.o. [name] [name] Primary Care Provider: [name] Other Clinician: Referring Provider: Treating Provider/Extender: RO BSO [name], [name] [name] in Treatment: [address], Vanna [name] ([id number]) [id number] Page 11 of 11 Diagnosis Coding ICD-10 Codes Code Description [phone] Non-pressure chronic ulcer of other part of left lower leg with fat layer exposed I87.312 Chronic venous hypertension (idiopathic) with ulcer of left lower extremity I89.0 Lymphedema, not elsewhere classified I87.321 Chronic venous hypertension (idiopathic) with inflammation of right lower extremity E11.622 Type 2 diabetes mellitus with other skin ulcer Facility Procedures :  works nights [name] [name], [name] [name] ([id number]) [id number] Page 4 of 11 Electronic Signature(s) Signed: 06/15/2023 4:42:07 PM By: Baltazar Najjar MD Entered By: Baltazar Najjar on 06/15/2023 05:33:46 -------------------------------------------------------------------------------- Physical Exam Details Patient Name: Date of Service: [name], CO SLO W [name] 06/15/2023 8:00 [name] [name] Medical Record Number: [id number] Patient Account Number: [id number] Date of Birth/Sex: Treating RN: 1971-03-25 (52 y.o. [name] [name] Primary Care Provider: [name] Other Clinician: Referring Provider: Treating Provider/Extender: RO BSO N, [name] [name] in Treatment: 17 Constitutional Patient is hypertensive.. Pulse regular and within target range for patient.[name] Respirations regular, non-labored and within target range.. Temperature is normal and within the target range for the patient.[name] appears in no distress. Cardiovascular Pedal pulses are palpable. Moderate amount of nonpitting edema. Notes Wound exam; circular wound in the left mid anterior lower leg. The edema control here is mediocre. Surface of the wound however looks healthy. No evidence of surrounding infection. No debridement was necessary. Electronic Signature(s) Signed: 06/15/2023 4:42:07 PM By: Baltazar Najjar MD Entered By: Baltazar Najjar on 06/15/2023 05:35:29 -------------------------------------------------------------------------------- Physician Orders Details Patient Name: Date of Service: [name], CO SLO W [name] 06/15/2023 8:00 [name] [name] Medical Record Number: [id number] Patient Account Number: [id number] Date of Birth/Sex: Treating RN: 10/19/1970 (52 y.o. [name] [name] Primary Care Provider: [name] Other Clinician: Referring Provider: Treating Provider/Extender: RO BSO [name], [name] [name] in Treatment: [id number] Verbal / Phone  Orders: No Diagnosis Coding Follow-up Appointments Return Appointment in 1 week. Home Health Home Health Company: - [facility] for wound care. May utilize formulary equivalent dressing for wound treatment orders unless otherwise specified. Home Health Nurse may visit PRN to address patients wound care needs. [name] [id number] [name] [name], [name] [name] ([id number]) [id number] Page 5 of 11 Change on Monday and Friday. Wound Care Center will change on Wednesday Scheduled days for dressing changes to be completed; exception, patient has scheduled wound care visit that day. **Please direct any NON-WOUND related issues/requests for orders to patient's Primary Care Physician. **If current dressing causes regression in wound condition, may D/C ordered dressing product/s and apply Normal Saline Moist Dressing daily until next Wound Healing Center or Other MD appointment. **Notify Wound Healing Center of regression in wound condition at [phone]. Bathing/ Shower/ Hygiene May shower with wound dressing protected with water repellent cover or cast protector. No tub bath. Anesthetic (Use 'Patient Medications' Section for Anesthetic Order Entry) Lidocaine applied to wound bed Edema Control - Lymphedema / Segmental Compressive Device / Other Optional: One layer of unna paste to top of compression wrap (to act as an anchor). Tubigrip double layer applied - right leg Elevate, Exercise Daily and [name] void Standing for Long Periods of Time. Elevate legs to the level of the heart and pump ankles as often as possible Elevate leg(s) parallel to the floor when sitting. Wound Treatment Wound #22 - Lower Leg Wound Laterality: Left Cleanser: Soap and Water 3 x Per Week/30 Days Discharge Instructions: Gently cleanse wound with antibacterial soap, rinse and pat dry prior to dressing wounds Cleanser: Wound Cleanser 3 x Per Week/30 Days Discharge Instructions: Wash your hands  with soap and water. Remove old dressing, discard into plastic bag and place into trash. Cleanse the wound with Wound Cleanser prior to applying [name] clean dressing using gauze sponges, not tissues or cotton balls. Do not scrub or use excessive force. Pat dry using gauze sponges, not tissue or cotton balls. Prim Dressing: Hydrofera Blue Ready Transfer Foam,  works nights [name] [name], [name] [name] ([id number]) [id number] Page 4 of 11 Electronic Signature(s) Signed: 06/15/2023 4:42:07 PM By: Baltazar Najjar MD Entered By: Baltazar Najjar on 06/15/2023 05:33:46 -------------------------------------------------------------------------------- Physical Exam Details Patient Name: Date of Service: [name], CO SLO W [name] 06/15/2023 8:00 [name] [name] Medical Record Number: [id number] Patient Account Number: [id number] Date of Birth/Sex: Treating RN: 1971-03-25 (52 y.o. [name] [name] Primary Care Provider: [name] Other Clinician: Referring Provider: Treating Provider/Extender: RO BSO N, [name] [name] in Treatment: 17 Constitutional Patient is hypertensive.. Pulse regular and within target range for patient.[name] Respirations regular, non-labored and within target range.. Temperature is normal and within the target range for the patient.[name] appears in no distress. Cardiovascular Pedal pulses are palpable. Moderate amount of nonpitting edema. Notes Wound exam; circular wound in the left mid anterior lower leg. The edema control here is mediocre. Surface of the wound however looks healthy. No evidence of surrounding infection. No debridement was necessary. Electronic Signature(s) Signed: 06/15/2023 4:42:07 PM By: Baltazar Najjar MD Entered By: Baltazar Najjar on 06/15/2023 05:35:29 -------------------------------------------------------------------------------- Physician Orders Details Patient Name: Date of Service: [name], CO SLO W [name] 06/15/2023 8:00 [name] [name] Medical Record Number: [id number] Patient Account Number: [id number] Date of Birth/Sex: Treating RN: 10/19/1970 (52 y.o. [name] [name] Primary Care Provider: [name] Other Clinician: Referring Provider: Treating Provider/Extender: RO BSO [name], [name] [name] in Treatment: [phone] Verbal / Phone  Orders: No Diagnosis Coding Follow-up Appointments Return Appointment in 1 week. Home Health Home Health Company: - [facility] for wound care. May utilize formulary equivalent dressing for wound treatment orders unless otherwise specified. Home Health Nurse may visit PRN to address patients wound care needs. [name] [id number] [name] [name], [name] [name] ([id number]) [id number] Page 5 of 11 Change on Monday and Friday. Wound Care Center will change on Wednesday Scheduled days for dressing changes to be completed; exception, patient has scheduled wound care visit that day. **Please direct any NON-WOUND related issues/requests for orders to patient's Primary Care Physician. **If current dressing causes regression in wound condition, may D/C ordered dressing product/s and apply Normal Saline Moist Dressing daily until next Wound Healing Center or Other MD appointment. **Notify Wound Healing Center of regression in wound condition at [phone]. Bathing/ Shower/ Hygiene May shower with wound dressing protected with water repellent cover or cast protector. No tub bath. Anesthetic (Use 'Patient Medications' Section for Anesthetic Order Entry) Lidocaine applied to wound bed Edema Control - Lymphedema / Segmental Compressive Device / Other Optional: One layer of unna paste to top of compression wrap (to act as an anchor). Tubigrip double layer applied - right leg Elevate, Exercise Daily and [name] void Standing for Long Periods of Time. Elevate legs to the level of the heart and pump ankles as often as possible Elevate leg(s) parallel to the floor when sitting. Wound Treatment Wound #22 - Lower Leg Wound Laterality: Left Cleanser: Soap and Water 3 x Per Week/30 Days Discharge Instructions: Gently cleanse wound with antibacterial soap, rinse and pat dry prior to dressing wounds Cleanser: Wound Cleanser 3 x Per Week/30 Days Discharge Instructions: Wash your hands  with soap and water. Remove old dressing, discard into plastic bag and place into trash. Cleanse the wound with Wound Cleanser prior to applying [name] clean dressing using gauze sponges, not tissues or cotton balls. Do not scrub or use excessive force. Pat dry using gauze sponges, not tissue or cotton balls. Prim Dressing: Hydrofera Blue Ready Transfer Foam,  2.5x2.5 (in/in) 3 x Per Week/30 Days ary Discharge Instructions: Apply Hydrofera Blue Ready to wound bed as directed Secondary Dressing: Zetuvit Plus 4x4 (in/in) 3 x Per Week/30 Days Secured With: Dole Food Dressing, Latex-free, Size 5, Small-Head / Shoulder / Thigh 3 x Per Week/30 Days Discharge Instructions: over wrap for comfort Compression Wrap: Urgo K2, two layer compression system, large 3 x Per Week/30 Days Electronic Signature(s) Signed: 06/15/2023 4:42:07 PM By: Baltazar Najjar MD Signed: 06/17/2023 11:41:18 AM By: [name] RN Entered By: [name] on 06/15/2023 05:23:03 -------------------------------------------------------------------------------- Problem List Details Patient Name: Date of Service: [name], CO SLO W [name] 06/15/2023 8:00 [name] [name] Medical Record Number: [id number] Patient Account Number: [id number] Date of Birth/Sex: Treating RN: 1970-11-08 (52 y.o. [name] [name] Primary Care Provider: [name] Other Clinician: Referring Provider: Treating Provider/Extender: RO BSO N, [name] [name] in Treatment: 17 Active Problems ICD-10 Encounter Code Description Active Date MDM Diagnosis L97.822 Non-pressure chronic ulcer of other part of left lower leg with fat layer exposed6/19/2024 No Yes [name] [name], [name] [name] ([id number]) [id number] Page 6 of 11 I87.312 Chronic venous hypertension (idiopathic) with ulcer of left lower extremity 02/16/2023 No Yes I89.0 Lymphedema, not elsewhere classified 03/23/2023 No Yes I87.321 Chronic venous hypertension (idiopathic) with  inflammation of right lower 02/16/2023 No Yes extremity E11.622 Type 2 diabetes mellitus with other skin ulcer 02/16/2023 No Yes Inactive Problems ICD-10 Code Description Active Date Inactive Date L97.522 Non-pressure chronic ulcer of other part of left foot with fat layer exposed 03/16/2023 03/16/2023 Resolved Problems Electronic Signature(s) Signed: 06/15/2023 4:42:07 PM By: Baltazar Najjar MD Entered By: Baltazar Najjar on 06/15/2023 05:32:55 -------------------------------------------------------------------------------- Progress Note Details Patient Name: Date of Service: [name], CO SLO W [name] 06/15/2023 8:00 [name] [name] Medical Record Number: [id number] Patient Account Number: [id number] Date of Birth/Sex: Treating RN: [birth year] (52 y.o. [name] [name] Primary Care Provider: [name] Other Clinician: Referring Provider: Treating Provider/Extender: RO BSO N, [name] [name] in Treatment: 17 Subjective History of Present Illness (HPI) 12/28/17; this is [name] now 51 year old man who is [name] type II diabetic. He was hospitalized from 10/01/17 through 10/19/17. He had an MSSA soft tissue and skin infection. 2 open areas on the left leg were identified he has [name] smaller area on the left medial calf superiorly just below the knee and [name] wound just above the left ankle on the posterior medial aspect. I think both of these were surgical IandD sites when he was in the hospital. He was discharged with [name] wound VAC at that point however this is since been taken off. He follows with Dr. Sampson Goon for the [facility] and he is still on chronic Keflex at 500 twice [name] day. At that time he was hospitalized his hemoglobin A1c was 15.1 however if I'[name] reading his endocrinologist notes correctly that is improved. He has been following with Dr. Lorretta Harp at vein and vascular and he has been applying calcium alginate and Unna boots. He has home health changing the dressing. They have also been attempting to get him  external compression pumps although the patient is unaware whether they've been approved by insurance at this point. as mentioned he has [name] smaller clean wound on the right lateral calf just below the knee and he has [name] much larger area just above the left ankle medially and posteriorly. Our intake nurse reported greenish purulent looking drainage.the patient did have surgical material sent to pathology in February. This showed chronic abscess The patient  in 1 week. Home Health: Home Health Company: - [facility] for wound care. May utilize formulary equivalent dressing for wound treatment orders unless otherwise specified. Home Health Nurse may visit PRN to address patients wound care needs. [name] [phone] Change on Monday and Friday. Wound Care Center will change on Wednesday Scheduled days for dressing changes to be completed; exception, patient has scheduled wound care visit that day. **Please direct any NON-WOUND related issues/requests for orders to patient's Primary Care Physician. **If current dressing causes regression in wound condition, may D/C ordered dressing product/s and apply Normal Saline Moist Dressing daily until next Wound Healing Center or Other MD appointment. **Notify Wound Healing Center of regression in wound condition at [phone]. Bathing/ Shower/ Hygiene: May shower with wound dressing protected with water repellent cover or cast protector. No tub bath. Anesthetic (Use 'Patient Medications' Section for Anesthetic Order Entry): Lidocaine applied to wound bed Edema Control - Lymphedema / Segmental Compressive Device /  Other: Optional: One layer of unna paste to top of compression wrap (to act as an anchor). Tubigrip double layer applied - right leg Elevate, Exercise Daily and Avoid Standing for Long Periods of Time. Elevate legs to the level of the heart and pump ankles as often as possible Elevate leg(s) parallel to the floor when sitting. WOUND #22: - Lower Leg Wound Laterality: Left Cleanser: Soap and Water 3 x Per Week/30 Days Discharge Instructions: Gently cleanse wound with antibacterial soap, rinse and pat dry prior to dressing wounds Cleanser: Wound Cleanser 3 x Per Week/30 Days Discharge Instructions: Wash your hands with soap and water. Remove old dressing, discard into plastic bag and place into trash. Cleanse the wound with Wound Cleanser prior to applying [name] clean dressing using gauze sponges, not tissues or cotton balls. Do not scrub or use excessive force. Pat dry using gauze sponges, not tissue or cotton balls. Prim Dressing: Hydrofera Blue Ready Transfer Foam, 2.5x2.5 (in/in) 3 x Per Week/30 Days ary Discharge Instructions: Apply Hydrofera Blue Ready to wound bed as directed Secondary Dressing: Zetuvit Plus 4x4 (in/in) 3 x Per Week/30 Days Secured With: Dole Food Dressing, Latex-free, Size 5, Small-Head / Shoulder / Thigh 3 x Per Week/30 Days Discharge Instructions: over wrap for comfort Com pression Wrap: Urgo K2, two layer compression system, large 3 x Per Week/30 Days 1. The wound is smaller. No need to change the primary dressing which is Hydrofera Blue ABDs under and Urgo K2 2. I have talked to him about using his compression pump pumps both now and after he heals for wound prophylaxis 3. We will need to ask about his stockings perhaps as early as next week Electronic Signature(s) Signed: 06/15/2023 4:42:07 PM By: Baltazar Najjar MD Entered By: Baltazar Najjar on 06/15/2023 05:36:10 -------------------------------------------------------------------------------- SuperBill  Details Patient Name: Date of Service: [name], CO SLO W [name] 06/15/2023 Medical Record Number: [id number] Patient Account Number: [id number] Date of Birth/Sex: Treating RN: [birth year] (52 y.o. [name] [name] Primary Care Provider: [name] Other Clinician: Referring Provider: Treating Provider/Extender: RO BSO [name], [name] [name] in Treatment: [address], [place] [name] ([id number]) [id number] Page 11 of 11 Diagnosis Coding ICD-10 Codes Code Description [phone] Non-pressure chronic ulcer of other part of left lower leg with fat layer exposed I87.312 Chronic venous hypertension (idiopathic) with ulcer of left lower extremity I89.0 Lymphedema, not elsewhere classified I87.321 Chronic venous hypertension (idiopathic) with inflammation of right lower extremity E11.622 Type 2 diabetes mellitus with other skin ulcer Facility Procedures :  STEFONE, [name] [name] ([id number]) [id number] Page 1 of 11 Visit Report for 06/15/2023 HPI Details Patient Name: Date of Service: [name], CO SLO W [name] 06/15/2023 8:00 [name] [name] Medical Record Number: [id number] Patient Account Number: [id number] Date of Birth/Sex: Treating RN: Dec 07, 1970 (52 y.o. [name]) [name] Primary Care Provider: [name] Other Clinician: Referring Provider: Treating Provider/Extender: RO BSO N, [name] [name] in Treatment: 17 History of Present Illness HPI Description: 12/28/17; this is [name] now 52 year old man who is [name] type II diabetic. He was hospitalized from 10/01/17 through 10/19/17. He had an MSSA soft tissue and skin infection. 2 open areas on the left leg were identified he has [name] smaller area on the left medial calf superiorly just below the knee and [name] wound just above the left ankle on the posterior medial aspect. I think both of these were surgical IandD sites when he was in the hospital. He was discharged with [name] wound VAC at that point however this is since been taken off. He follows with Dr. Sampson Goon for the [facility] and he is still on chronic Keflex at 500 twice [name] day. At that time he was hospitalized his hemoglobin A1c was 15.1 however if I'[name] reading his endocrinologist notes correctly that is improved. He has been following with Dr. Lorretta Harp at vein and vascular and he has been applying calcium alginate and Unna boots. He has home health changing the dressing. They have also been attempting to get him external compression pumps although the patient is unaware whether they've been approved by insurance at this point. as mentioned he has [name] smaller clean wound on the right lateral calf just below the knee and he has [name] much larger area just above the left ankle medially and posteriorly. Our intake nurse reported greenish purulent looking drainage.the patient did have surgical material sent to pathology in February. This  showed chronic abscess The patient also has lymphedema stage III in the left greater than right lower extremities. He has [name] history of blisters with wounds but these of all were always healed. The patient thinks that the lymphedema may have been present since he was about 52 years old i.e. about 30 years. He does not have graded pressure stockings and has not worn stockings. He does not have [name] distant history of DVT PE or phlebitis. He has not been systemically unwell fever no chills. He states that his Lasix is recently been reduced. He tells me his kidney function is at "30%" and he has been followed by Dr. Thedore Mins of nephrology. At one point he was on Lasix 80 twice [name] day however that's been cut back and he is now on Lasix at 20 twice [name] day. The patient has [name] history of PAD listed in his records although he comes from Dr. Lorretta Harp I don't think is felt to have significant PAD. ABIs in our clinic were noncompressible bilaterally. 01/04/18; patient has [name] large wound on the left lateral lower calf and [name] small wound on the left medial upper calf. He has been to see his nephrologist who changed him to Demadex 40 mg [name] day. I'[name] hopeful this will help with his systemic fluid overload. He also has stage III lymphedema. Really no change in the 2 wounds since last week 01/11/18; the patient is down 13 pounds. He put his stage III lymphedema left leg in 4K compression last week and there is less edema fluid however we still haven't been able to communicate with home health but  in 1 week. Home Health: Home Health Company: - [facility] for wound care. May utilize formulary equivalent dressing for wound treatment orders unless otherwise specified. Home Health Nurse may visit PRN to address patients wound care needs. [name] [phone] Change on Monday and Friday. Wound Care Center will change on Wednesday Scheduled days for dressing changes to be completed; exception, patient has scheduled wound care visit that day. **Please direct any NON-WOUND related issues/requests for orders to patient's Primary Care Physician. **If current dressing causes regression in wound condition, may D/C ordered dressing product/s and apply Normal Saline Moist Dressing daily until next Wound Healing Center or Other MD appointment. **Notify Wound Healing Center of regression in wound condition at [phone]. Bathing/ Shower/ Hygiene: May shower with wound dressing protected with water repellent cover or cast protector. No tub bath. Anesthetic (Use 'Patient Medications' Section for Anesthetic Order Entry): Lidocaine applied to wound bed Edema Control - Lymphedema / Segmental Compressive Device /  Other: Optional: One layer of unna paste to top of compression wrap (to act as an anchor). Tubigrip double layer applied - right leg Elevate, Exercise Daily and Avoid Standing for Long Periods of Time. Elevate legs to the level of the heart and pump ankles as often as possible Elevate leg(s) parallel to the floor when sitting. WOUND #22: - Lower Leg Wound Laterality: Left Cleanser: Soap and Water 3 x Per Week/30 Days Discharge Instructions: Gently cleanse wound with antibacterial soap, rinse and pat dry prior to dressing wounds Cleanser: Wound Cleanser 3 x Per Week/30 Days Discharge Instructions: Wash your hands with soap and water. Remove old dressing, discard into plastic bag and place into trash. Cleanse the wound with Wound Cleanser prior to applying [name] clean dressing using gauze sponges, not tissues or cotton balls. Do not scrub or use excessive force. Pat dry using gauze sponges, not tissue or cotton balls. Prim Dressing: Hydrofera Blue Ready Transfer Foam, 2.5x2.5 (in/in) 3 x Per Week/30 Days ary Discharge Instructions: Apply Hydrofera Blue Ready to wound bed as directed Secondary Dressing: Zetuvit Plus 4x4 (in/in) 3 x Per Week/30 Days Secured With: Dole Food Dressing, Latex-free, Size 5, Small-Head / Shoulder / Thigh 3 x Per Week/30 Days Discharge Instructions: over wrap for comfort Com pression Wrap: Urgo K2, two layer compression system, large 3 x Per Week/30 Days 1. The wound is smaller. No need to change the primary dressing which is Hydrofera Blue ABDs under and Urgo K2 2. I have talked to him about using his compression pump pumps both now and after he heals for wound prophylaxis 3. We will need to ask about his stockings perhaps as early as next week Electronic Signature(s) Signed: 06/15/2023 4:42:07 PM By: Baltazar Najjar MD Entered By: Baltazar Najjar on 06/15/2023 05:36:10 -------------------------------------------------------------------------------- SuperBill  Details Patient Name: Date of Service: [name], CO SLO W [name] 06/15/2023 Medical Record Number: [id number] Patient Account Number: [id number] Date of Birth/Sex: Treating RN: [birth year] (52 y.o. [name] [name] Primary Care Provider: [name] Other Clinician: Referring Provider: Treating Provider/Extender: RO BSO [name], [name] [name] in Treatment: [address], [place] [name] ([id number]) [id number] Page 11 of 11 Diagnosis Coding ICD-10 Codes Code Description [phone] Non-pressure chronic ulcer of other part of left lower leg with fat layer exposed I87.312 Chronic venous hypertension (idiopathic) with ulcer of left lower extremity I89.0 Lymphedema, not elsewhere classified I87.321 Chronic venous hypertension (idiopathic) with inflammation of right lower extremity E11.622 Type 2 diabetes mellitus with other skin ulcer Facility Procedures :  works nights [name] [name], [name] [name] ([id number]) [id number] Page 4 of 11 Electronic Signature(s) Signed: 06/15/2023 4:42:07 PM By: Baltazar Najjar MD Entered By: Baltazar Najjar on 06/15/2023 05:33:46 -------------------------------------------------------------------------------- Physical Exam Details Patient Name: Date of Service: [name], CO SLO W [name] 06/15/2023 8:00 [name] [name] Medical Record Number: [id number] Patient Account Number: [id number] Date of Birth/Sex: Treating RN: 1971-03-25 (52 y.o. [name] [name] Primary Care Provider: [name] Other Clinician: Referring Provider: Treating Provider/Extender: RO BSO N, [name] [name] in Treatment: 17 Constitutional Patient is hypertensive.. Pulse regular and within target range for patient.[name] Respirations regular, non-labored and within target range.. Temperature is normal and within the target range for the patient.[name] appears in no distress. Cardiovascular Pedal pulses are palpable. Moderate amount of nonpitting edema. Notes Wound exam; circular wound in the left mid anterior lower leg. The edema control here is mediocre. Surface of the wound however looks healthy. No evidence of surrounding infection. No debridement was necessary. Electronic Signature(s) Signed: 06/15/2023 4:42:07 PM By: Baltazar Najjar MD Entered By: Baltazar Najjar on 06/15/2023 05:35:29 -------------------------------------------------------------------------------- Physician Orders Details Patient Name: Date of Service: [name], CO SLO W [name] 06/15/2023 8:00 [name] [name] Medical Record Number: [id number] Patient Account Number: [id number] Date of Birth/Sex: Treating RN: 10/19/1970 (52 y.o. [name] [name] Primary Care Provider: [name] Other Clinician: Referring Provider: Treating Provider/Extender: RO BSO [name], [name] [name] in Treatment: [id number] Verbal / Phone  Orders: No Diagnosis Coding Follow-up Appointments Return Appointment in 1 week. Home Health Home Health Company: - [facility] for wound care. May utilize formulary equivalent dressing for wound treatment orders unless otherwise specified. Home Health Nurse may visit PRN to address patients wound care needs. [name] [id number] [name] [name], [name] [name] ([id number]) [id number] Page 5 of 11 Change on Monday and Friday. Wound Care Center will change on Wednesday Scheduled days for dressing changes to be completed; exception, patient has scheduled wound care visit that day. **Please direct any NON-WOUND related issues/requests for orders to patient's Primary Care Physician. **If current dressing causes regression in wound condition, may D/C ordered dressing product/s and apply Normal Saline Moist Dressing daily until next Wound Healing Center or Other MD appointment. **Notify Wound Healing Center of regression in wound condition at [phone]. Bathing/ Shower/ Hygiene May shower with wound dressing protected with water repellent cover or cast protector. No tub bath. Anesthetic (Use 'Patient Medications' Section for Anesthetic Order Entry) Lidocaine applied to wound bed Edema Control - Lymphedema / Segmental Compressive Device / Other Optional: One layer of unna paste to top of compression wrap (to act as an anchor). Tubigrip double layer applied - right leg Elevate, Exercise Daily and [name] void Standing for Long Periods of Time. Elevate legs to the level of the heart and pump ankles as often as possible Elevate leg(s) parallel to the floor when sitting. Wound Treatment Wound #22 - Lower Leg Wound Laterality: Left Cleanser: Soap and Water 3 x Per Week/30 Days Discharge Instructions: Gently cleanse wound with antibacterial soap, rinse and pat dry prior to dressing wounds Cleanser: Wound Cleanser 3 x Per Week/30 Days Discharge Instructions: Wash your hands  with soap and water. Remove old dressing, discard into plastic bag and place into trash. Cleanse the wound with Wound Cleanser prior to applying [name] clean dressing using gauze sponges, not tissues or cotton balls. Do not scrub or use excessive force. Pat dry using gauze sponges, not tissue or cotton balls. Prim Dressing: Hydrofera Blue Ready Transfer Foam,  STEFONE, [name] [name] ([id number]) [id number] Page 1 of 11 Visit Report for 06/15/2023 HPI Details Patient Name: Date of Service: [name], CO SLO W [name] 06/15/2023 8:00 [name] [name] Medical Record Number: [id number] Patient Account Number: [id number] Date of Birth/Sex: Treating RN: Dec 07, 1970 (52 y.o. [name]) [name] Primary Care Provider: [name] Other Clinician: Referring Provider: Treating Provider/Extender: RO BSO N, [name] [name] in Treatment: 17 History of Present Illness HPI Description: 12/28/17; this is [name] now 52 year old man who is [name] type II diabetic. He was hospitalized from 10/01/17 through 10/19/17. He had an MSSA soft tissue and skin infection. 2 open areas on the left leg were identified he has [name] smaller area on the left medial calf superiorly just below the knee and [name] wound just above the left ankle on the posterior medial aspect. I think both of these were surgical IandD sites when he was in the hospital. He was discharged with [name] wound VAC at that point however this is since been taken off. He follows with Dr. Sampson Goon for the [facility] and he is still on chronic Keflex at 500 twice [name] day. At that time he was hospitalized his hemoglobin A1c was 15.1 however if I'[name] reading his endocrinologist notes correctly that is improved. He has been following with Dr. Lorretta Harp at vein and vascular and he has been applying calcium alginate and Unna boots. He has home health changing the dressing. They have also been attempting to get him external compression pumps although the patient is unaware whether they've been approved by insurance at this point. as mentioned he has [name] smaller clean wound on the right lateral calf just below the knee and he has [name] much larger area just above the left ankle medially and posteriorly. Our intake nurse reported greenish purulent looking drainage.the patient did have surgical material sent to pathology in February. This  showed chronic abscess The patient also has lymphedema stage III in the left greater than right lower extremities. He has [name] history of blisters with wounds but these of all were always healed. The patient thinks that the lymphedema may have been present since he was about 52 years old i.e. about 30 years. He does not have graded pressure stockings and has not worn stockings. He does not have [name] distant history of DVT PE or phlebitis. He has not been systemically unwell fever no chills. He states that his Lasix is recently been reduced. He tells me his kidney function is at "30%" and he has been followed by Dr. Thedore Mins of nephrology. At one point he was on Lasix 80 twice [name] day however that's been cut back and he is now on Lasix at 20 twice [name] day. The patient has [name] history of PAD listed in his records although he comes from Dr. Lorretta Harp I don't think is felt to have significant PAD. ABIs in our clinic were noncompressible bilaterally. 01/04/18; patient has [name] large wound on the left lateral lower calf and [name] small wound on the left medial upper calf. He has been to see his nephrologist who changed him to Demadex 40 mg [name] day. I'[name] hopeful this will help with his systemic fluid overload. He also has stage III lymphedema. Really no change in the 2 wounds since last week 01/11/18; the patient is down 13 pounds. He put his stage III lymphedema left leg in 4K compression last week and there is less edema fluid however we still haven't been able to communicate with home health but  in 1 week. Home Health: Home Health Company: - [facility] for wound care. May utilize formulary equivalent dressing for wound treatment orders unless otherwise specified. Home Health Nurse may visit PRN to address patients wound care needs. [name] [phone] Change on Monday and Friday. Wound Care Center will change on Wednesday Scheduled days for dressing changes to be completed; exception, patient has scheduled wound care visit that day. **Please direct any NON-WOUND related issues/requests for orders to patient's Primary Care Physician. **If current dressing causes regression in wound condition, may D/C ordered dressing product/s and apply Normal Saline Moist Dressing daily until next Wound Healing Center or Other MD appointment. **Notify [facility] of regression in wound condition at [phone]. Bathing/ Shower/ Hygiene: May shower with wound dressing protected with water repellent cover or cast protector. No tub bath. Anesthetic (Use 'Patient Medications' Section for Anesthetic Order Entry): Lidocaine applied to wound bed Edema Control - Lymphedema / Segmental Compressive Device /  Other: Optional: One layer of unna paste to top of compression wrap (to act as an anchor). Tubigrip double layer applied - right leg Elevate, Exercise Daily and Avoid Standing for Long Periods of Time. Elevate legs to the level of the heart and pump ankles as often as possible Elevate leg(s) parallel to the floor when sitting. WOUND #22: - Lower Leg Wound Laterality: Left Cleanser: Soap and Water 3 x Per Week/30 Days Discharge Instructions: Gently cleanse wound with antibacterial soap, rinse and pat dry prior to dressing wounds Cleanser: Wound Cleanser 3 x Per Week/30 Days Discharge Instructions: Wash your hands with soap and water. Remove old dressing, discard into plastic bag and place into trash. Cleanse the wound with Wound Cleanser prior to applying [name] clean dressing using gauze sponges, not tissues or cotton balls. Do not scrub or use excessive force. Pat dry using gauze sponges, not tissue or cotton balls. Prim Dressing: Hydrofera Blue Ready Transfer Foam, 2.5x2.5 (in/in) 3 x Per Week/30 Days ary Discharge Instructions: Apply Hydrofera Blue Ready to wound bed as directed Secondary Dressing: Zetuvit Plus 4x4 (in/in) 3 x Per Week/30 Days Secured With: Dole Food Dressing, Latex-free, Size 5, Small-Head / Shoulder / Thigh 3 x Per Week/30 Days Discharge Instructions: over wrap for comfort Com pression Wrap: Urgo K2, two layer compression system, large 3 x Per Week/30 Days 1. The wound is smaller. No need to change the primary dressing which is Hydrofera Blue ABDs under and Urgo K2 2. I have talked to him about using his compression pump pumps both now and after he heals for wound prophylaxis 3. We will need to ask about his stockings perhaps as early as next week Electronic Signature(s) Signed: 06/15/2023 4:42:07 PM By: Baltazar Najjar MD Entered By: Baltazar Najjar on 06/15/2023 05:36:10 -------------------------------------------------------------------------------- SuperBill  Details Patient Name: Date of Service: [name], CO SLO W [name] 06/15/2023 Medical Record Number: [id number] Patient Account Number: [id number] Date of Birth/Sex: Treating RN: [birth year] (52 y.o. [name] [name] Primary Care Provider: [name] Other Clinician: Referring Provider: Treating Provider/Extender: RO BSO [name], [name] [name] in Treatment: [address], [place] [name] ([id number]) [id number] Page 11 of 11 Diagnosis Coding ICD-10 Codes Code Description [phone] Non-pressure chronic ulcer of other part of left lower leg with fat layer exposed I87.312 Chronic venous hypertension (idiopathic) with ulcer of left lower extremity I89.0 Lymphedema, not elsewhere classified I87.321 Chronic venous hypertension (idiopathic) with inflammation of right lower extremity E11.622 Type 2 diabetes mellitus with other skin ulcer Facility Procedures :  STEFONE, [name] [name] ([id number]) [id number] Page 1 of 11 Visit Report for 06/15/2023 HPI Details Patient Name: Date of Service: [name], CO SLO W [name] 06/15/2023 8:00 [name] [name] Medical Record Number: [id number] Patient Account Number: [id number] Date of Birth/Sex: Treating RN: Dec 07, 1970 (52 y.o. [name]) [name] Primary Care Provider: [name] Other Clinician: Referring Provider: Treating Provider/Extender: RO BSO N, [name] [name] in Treatment: 17 History of Present Illness HPI Description: 12/28/17; this is [name] now 52 year old man who is [name] type II diabetic. He was hospitalized from 10/01/17 through 10/19/17. He had an MSSA soft tissue and skin infection. 2 open areas on the left leg were identified he has [name] smaller area on the left medial calf superiorly just below the knee and [name] wound just above the left ankle on the posterior medial aspect. I think both of these were surgical IandD sites when he was in the hospital. He was discharged with [name] wound VAC at that point however this is since been taken off. He follows with Dr. Sampson Goon for the [facility] and he is still on chronic Keflex at 500 twice [name] day. At that time he was hospitalized his hemoglobin A1c was 15.1 however if I'[name] reading his endocrinologist notes correctly that is improved. He has been following with Dr. Lorretta Harp at vein and vascular and he has been applying calcium alginate and Unna boots. He has home health changing the dressing. They have also been attempting to get him external compression pumps although the patient is unaware whether they've been approved by insurance at this point. as mentioned he has [name] smaller clean wound on the right lateral calf just below the knee and he has [name] much larger area just above the left ankle medially and posteriorly. Our intake nurse reported greenish purulent looking drainage.the patient did have surgical material sent to pathology in February. This  showed chronic abscess The patient also has lymphedema stage III in the left greater than right lower extremities. He has [name] history of blisters with wounds but these of all were always healed. The patient thinks that the lymphedema may have been present since he was about 52 years old i.e. about 30 years. He does not have graded pressure stockings and has not worn stockings. He does not have [name] distant history of DVT PE or phlebitis. He has not been systemically unwell fever no chills. He states that his Lasix is recently been reduced. He tells me his kidney function is at "30%" and he has been followed by Dr. Thedore Mins of nephrology. At one point he was on Lasix 80 twice [name] day however that's been cut back and he is now on Lasix at 20 twice [name] day. The patient has [name] history of PAD listed in his records although he comes from Dr. Lorretta Harp I don't think is felt to have significant PAD. ABIs in our clinic were noncompressible bilaterally. 01/04/18; patient has [name] large wound on the left lateral lower calf and [name] small wound on the left medial upper calf. He has been to see his nephrologist who changed him to Demadex 40 mg [name] day. I'[name] hopeful this will help with his systemic fluid overload. He also has stage III lymphedema. Really no change in the 2 wounds since last week 01/11/18; the patient is down 13 pounds. He put his stage III lymphedema left leg in 4K compression last week and there is less edema fluid however we still haven't been able to communicate with home health but  works nights [name] [name], [name] [name] ([id number]) [id number] Page 4 of 11 Electronic Signature(s) Signed: 06/15/2023 4:42:07 PM By: Baltazar Najjar MD Entered By: Baltazar Najjar on 06/15/2023 05:33:46 -------------------------------------------------------------------------------- Physical Exam Details Patient Name: Date of Service: [name], CO SLO W [name] 06/15/2023 8:00 [name] [name] Medical Record Number: [id number] Patient Account Number: [id number] Date of Birth/Sex: Treating RN: 1971-03-25 (52 y.o. [name] [name] Primary Care Provider: [name] Other Clinician: Referring Provider: Treating Provider/Extender: RO BSO N, [name] [name] in Treatment: 17 Constitutional Patient is hypertensive.. Pulse regular and within target range for patient.[name] Respirations regular, non-labored and within target range.. Temperature is normal and within the target range for the patient.[name] appears in no distress. Cardiovascular Pedal pulses are palpable. Moderate amount of nonpitting edema. Notes Wound exam; circular wound in the left mid anterior lower leg. The edema control here is mediocre. Surface of the wound however looks healthy. No evidence of surrounding infection. No debridement was necessary. Electronic Signature(s) Signed: 06/15/2023 4:42:07 PM By: Baltazar Najjar MD Entered By: Baltazar Najjar on 06/15/2023 05:35:29 -------------------------------------------------------------------------------- Physician Orders Details Patient Name: Date of Service: [name], CO SLO W [name] 06/15/2023 8:00 [name] [name] Medical Record Number: [id number] Patient Account Number: [id number] Date of Birth/Sex: Treating RN: 10/19/1970 (52 y.o. [name] [name] Primary Care Provider: [name] Other Clinician: Referring Provider: Treating Provider/Extender: RO BSO [name], [name] [name] in Treatment: [id number] Verbal / Phone  Orders: No Diagnosis Coding Follow-up Appointments Return Appointment in 1 week. Home Health Home Health Company: - [facility] for wound care. May utilize formulary equivalent dressing for wound treatment orders unless otherwise specified. Home Health Nurse may visit PRN to address patients wound care needs. [name] [id number] [name] [name], [name] [name] ([id number]) [id number] Page 5 of 11 Change on Monday and Friday. Wound Care Center will change on Wednesday Scheduled days for dressing changes to be completed; exception, patient has scheduled wound care visit that day. **Please direct any NON-WOUND related issues/requests for orders to patient's Primary Care Physician. **If current dressing causes regression in wound condition, may D/C ordered dressing product/s and apply Normal Saline Moist Dressing daily until next Wound Healing Center or Other MD appointment. **Notify Wound Healing Center of regression in wound condition at [phone]. Bathing/ Shower/ Hygiene May shower with wound dressing protected with water repellent cover or cast protector. No tub bath. Anesthetic (Use 'Patient Medications' Section for Anesthetic Order Entry) Lidocaine applied to wound bed Edema Control - Lymphedema / Segmental Compressive Device / Other Optional: One layer of unna paste to top of compression wrap (to act as an anchor). Tubigrip double layer applied - right leg Elevate, Exercise Daily and [name] void Standing for Long Periods of Time. Elevate legs to the level of the heart and pump ankles as often as possible Elevate leg(s) parallel to the floor when sitting. Wound Treatment Wound #22 - Lower Leg Wound Laterality: Left Cleanser: Soap and Water 3 x Per Week/30 Days Discharge Instructions: Gently cleanse wound with antibacterial soap, rinse and pat dry prior to dressing wounds Cleanser: Wound Cleanser 3 x Per Week/30 Days Discharge Instructions: Wash your hands  with soap and water. Remove old dressing, discard into plastic bag and place into trash. Cleanse the wound with Wound Cleanser prior to applying [name] clean dressing using gauze sponges, not tissues or cotton balls. Do not scrub or use excessive force. Pat dry using gauze sponges, not tissue or cotton balls. Prim Dressing: Hydrofera Blue Ready Transfer Foam,  in 1 week. Home Health: Home Health Company: - [facility] for wound care. May utilize formulary equivalent dressing for wound treatment orders unless otherwise specified. Home Health Nurse may visit PRN to address patients wound care needs. [name] [phone] Change on Monday and Friday. Wound Care Center will change on Wednesday Scheduled days for dressing changes to be completed; exception, patient has scheduled wound care visit that day. **Please direct any NON-WOUND related issues/requests for orders to patient's Primary Care Physician. **If current dressing causes regression in wound condition, may D/C ordered dressing product/s and apply Normal Saline Moist Dressing daily until next Wound Healing Center or Other MD appointment. **Notify Wound Healing Center of regression in wound condition at [phone]. Bathing/ Shower/ Hygiene: May shower with wound dressing protected with water repellent cover or cast protector. No tub bath. Anesthetic (Use 'Patient Medications' Section for Anesthetic Order Entry): Lidocaine applied to wound bed Edema Control - Lymphedema / Segmental Compressive Device /  Other: Optional: One layer of unna paste to top of compression wrap (to act as an anchor). Tubigrip double layer applied - right leg Elevate, Exercise Daily and Avoid Standing for Long Periods of Time. Elevate legs to the level of the heart and pump ankles as often as possible Elevate leg(s) parallel to the floor when sitting. WOUND #22: - Lower Leg Wound Laterality: Left Cleanser: Soap and Water 3 x Per Week/30 Days Discharge Instructions: Gently cleanse wound with antibacterial soap, rinse and pat dry prior to dressing wounds Cleanser: Wound Cleanser 3 x Per Week/30 Days Discharge Instructions: Wash your hands with soap and water. Remove old dressing, discard into plastic bag and place into trash. Cleanse the wound with Wound Cleanser prior to applying [name] clean dressing using gauze sponges, not tissues or cotton balls. Do not scrub or use excessive force. Pat dry using gauze sponges, not tissue or cotton balls. Prim Dressing: Hydrofera Blue Ready Transfer Foam, 2.5x2.5 (in/in) 3 x Per Week/30 Days ary Discharge Instructions: Apply Hydrofera Blue Ready to wound bed as directed Secondary Dressing: Zetuvit Plus 4x4 (in/in) 3 x Per Week/30 Days Secured With: Dole Food Dressing, Latex-free, Size 5, Small-Head / Shoulder / Thigh 3 x Per Week/30 Days Discharge Instructions: over wrap for comfort Com pression Wrap: Urgo K2, two layer compression system, large 3 x Per Week/30 Days 1. The wound is smaller. No need to change the primary dressing which is Hydrofera Blue ABDs under and Urgo K2 2. I have talked to him about using his compression pump pumps both now and after he heals for wound prophylaxis 3. We will need to ask about his stockings perhaps as early as next week Electronic Signature(s) Signed: 06/15/2023 4:42:07 PM By: Baltazar Najjar MD Entered By: Baltazar Najjar on 06/15/2023 05:36:10 -------------------------------------------------------------------------------- SuperBill  Details Patient Name: Date of Service: [name], CO SLO W [name] 06/15/2023 Medical Record Number: [id number] Patient Account Number: [id number] Date of Birth/Sex: Treating RN: [birth year] (52 y.o. [name] [name] Primary Care Provider: [name] Other Clinician: Referring Provider: Treating Provider/Extender: RO BSO [name], [name] [name] in Treatment: [address], [place] [name] ([id number]) [id number] Page 11 of 11 Diagnosis Coding ICD-10 Codes Code Description [phone] Non-pressure chronic ulcer of other part of left lower leg with fat layer exposed I87.312 Chronic venous hypertension (idiopathic) with ulcer of left lower extremity I89.0 Lymphedema, not elsewhere classified I87.321 Chronic venous hypertension (idiopathic) with inflammation of right lower extremity E11.622 Type 2 diabetes mellitus with other skin ulcer Facility Procedures :  STEFONE, [name] [name] ([id number]) [id number] Page 1 of 11 Visit Report for 06/15/2023 HPI Details Patient Name: Date of Service: [name], CO SLO W [name] 06/15/2023 8:00 [name] [name] Medical Record Number: [id number] Patient Account Number: [id number] Date of Birth/Sex: Treating RN: Dec 07, 1970 (52 y.o. [name]) [name] Primary Care Provider: [name] Other Clinician: Referring Provider: Treating Provider/Extender: RO BSO N, [name] [name] in Treatment: 17 History of Present Illness HPI Description: 12/28/17; this is [name] now 52 year old man who is [name] type II diabetic. He was hospitalized from 10/01/17 through 10/19/17. He had an MSSA soft tissue and skin infection. 2 open areas on the left leg were identified he has [name] smaller area on the left medial calf superiorly just below the knee and [name] wound just above the left ankle on the posterior medial aspect. I think both of these were surgical IandD sites when he was in the hospital. He was discharged with [name] wound VAC at that point however this is since been taken off. He follows with Dr. Sampson Goon for the [facility] and he is still on chronic Keflex at 500 twice [name] day. At that time he was hospitalized his hemoglobin A1c was 15.1 however if I'[name] reading his endocrinologist notes correctly that is improved. He has been following with Dr. Lorretta Harp at vein and vascular and he has been applying calcium alginate and Unna boots. He has home health changing the dressing. They have also been attempting to get him external compression pumps although the patient is unaware whether they've been approved by insurance at this point. as mentioned he has [name] smaller clean wound on the right lateral calf just below the knee and he has [name] much larger area just above the left ankle medially and posteriorly. Our intake nurse reported greenish purulent looking drainage.the patient did have surgical material sent to pathology in February. This  showed chronic abscess The patient also has lymphedema stage III in the left greater than right lower extremities. He has [name] history of blisters with wounds but these of all were always healed. The patient thinks that the lymphedema may have been present since he was about 52 years old i.e. about 30 years. He does not have graded pressure stockings and has not worn stockings. He does not have [name] distant history of DVT PE or phlebitis. He has not been systemically unwell fever no chills. He states that his Lasix is recently been reduced. He tells me his kidney function is at "30%" and he has been followed by Dr. Thedore Mins of nephrology. At one point he was on Lasix 80 twice [name] day however that's been cut back and he is now on Lasix at 20 twice [name] day. The patient has [name] history of PAD listed in his records although he comes from Dr. Lorretta Harp I don't think is felt to have significant PAD. ABIs in our clinic were noncompressible bilaterally. 01/04/18; patient has [name] large wound on the left lateral lower calf and [name] small wound on the left medial upper calf. He has been to see his nephrologist who changed him to Demadex 40 mg [name] day. I'[name] hopeful this will help with his systemic fluid overload. He also has stage III lymphedema. Really no change in the 2 wounds since last week 01/11/18; the patient is down 13 pounds. He put his stage III lymphedema left leg in 4K compression last week and there is less edema fluid however we still haven't been able to communicate with home health but  in 1 week. Home Health: Home Health Company: - [facility] for wound care. May utilize formulary equivalent dressing for wound treatment orders unless otherwise specified. Home Health Nurse may visit PRN to address patients wound care needs. [name] [phone] Change on Monday and Friday. Wound Care Center will change on Wednesday Scheduled days for dressing changes to be completed; exception, patient has scheduled wound care visit that day. **Please direct any NON-WOUND related issues/requests for orders to patient's Primary Care Physician. **If current dressing causes regression in wound condition, may D/C ordered dressing product/s and apply Normal Saline Moist Dressing daily until next Wound Healing Center or Other MD appointment. **Notify Wound Healing Center of regression in wound condition at [phone]. Bathing/ Shower/ Hygiene: May shower with wound dressing protected with water repellent cover or cast protector. No tub bath. Anesthetic (Use 'Patient Medications' Section for Anesthetic Order Entry): Lidocaine applied to wound bed Edema Control - Lymphedema / Segmental Compressive Device /  Other: Optional: One layer of unna paste to top of compression wrap (to act as an anchor). Tubigrip double layer applied - right leg Elevate, Exercise Daily and Avoid Standing for Long Periods of Time. Elevate legs to the level of the heart and pump ankles as often as possible Elevate leg(s) parallel to the floor when sitting. WOUND #22: - Lower Leg Wound Laterality: Left Cleanser: Soap and Water 3 x Per Week/30 Days Discharge Instructions: Gently cleanse wound with antibacterial soap, rinse and pat dry prior to dressing wounds Cleanser: Wound Cleanser 3 x Per Week/30 Days Discharge Instructions: Wash your hands with soap and water. Remove old dressing, discard into plastic bag and place into trash. Cleanse the wound with Wound Cleanser prior to applying [name] clean dressing using gauze sponges, not tissues or cotton balls. Do not scrub or use excessive force. Pat dry using gauze sponges, not tissue or cotton balls. Prim Dressing: Hydrofera Blue Ready Transfer Foam, 2.5x2.5 (in/in) 3 x Per Week/30 Days ary Discharge Instructions: Apply Hydrofera Blue Ready to wound bed as directed Secondary Dressing: Zetuvit Plus 4x4 (in/in) 3 x Per Week/30 Days Secured With: Dole Food Dressing, Latex-free, Size 5, Small-Head / Shoulder / Thigh 3 x Per Week/30 Days Discharge Instructions: over wrap for comfort Com pression Wrap: Urgo K2, two layer compression system, large 3 x Per Week/30 Days 1. The wound is smaller. No need to change the primary dressing which is Hydrofera Blue ABDs under and Urgo K2 2. I have talked to him about using his compression pump pumps both now and after he heals for wound prophylaxis 3. We will need to ask about his stockings perhaps as early as next week Electronic Signature(s) Signed: 06/15/2023 4:42:07 PM By: Baltazar Najjar MD Entered By: Baltazar Najjar on 06/15/2023 05:36:10 -------------------------------------------------------------------------------- SuperBill  Details Patient Name: Date of Service: [name], CO SLO W [name] 06/15/2023 Medical Record Number: [id number] Patient Account Number: [id number] Date of Birth/Sex: Treating RN: [birth year] (52 y.o. [name] [name] Primary Care Provider: [name] Other Clinician: Referring Provider: Treating Provider/Extender: RO BSO [name], [name] [name] in Treatment: [address], [place] [name] ([id number]) [id number] Page 11 of 11 Diagnosis Coding ICD-10 Codes Code Description [phone] Non-pressure chronic ulcer of other part of left lower leg with fat layer exposed I87.312 Chronic venous hypertension (idiopathic) with ulcer of left lower extremity I89.0 Lymphedema, not elsewhere classified I87.321 Chronic venous hypertension (idiopathic) with inflammation of right lower extremity E11.622 Type 2 diabetes mellitus with other skin ulcer Facility Procedures :  STEFONE, [name] [name] ([id number]) [id number] Page 1 of 11 Visit Report for 06/15/2023 HPI Details Patient Name: Date of Service: [name], CO SLO W [name] 06/15/2023 8:00 [name] [name] Medical Record Number: [id number] Patient Account Number: [id number] Date of Birth/Sex: Treating RN: Dec 07, 1970 (52 y.o. [name]) [name] Primary Care Provider: [name] Other Clinician: Referring Provider: Treating Provider/Extender: RO BSO N, [name] [name] in Treatment: 17 History of Present Illness HPI Description: 12/28/17; this is [name] now 52 year old man who is [name] type II diabetic. He was hospitalized from 10/01/17 through 10/19/17. He had an MSSA soft tissue and skin infection. 2 open areas on the left leg were identified he has [name] smaller area on the left medial calf superiorly just below the knee and [name] wound just above the left ankle on the posterior medial aspect. I think both of these were surgical IandD sites when he was in the hospital. He was discharged with [name] wound VAC at that point however this is since been taken off. He follows with Dr. Sampson Goon for the [facility] and he is still on chronic Keflex at 500 twice [name] day. At that time he was hospitalized his hemoglobin A1c was 15.1 however if I'[name] reading his endocrinologist notes correctly that is improved. He has been following with Dr. Lorretta Harp at vein and vascular and he has been applying calcium alginate and Unna boots. He has home health changing the dressing. They have also been attempting to get him external compression pumps although the patient is unaware whether they've been approved by insurance at this point. as mentioned he has [name] smaller clean wound on the right lateral calf just below the knee and he has [name] much larger area just above the left ankle medially and posteriorly. Our intake nurse reported greenish purulent looking drainage.the patient did have surgical material sent to pathology in February. This  showed chronic abscess The patient also has lymphedema stage III in the left greater than right lower extremities. He has [name] history of blisters with wounds but these of all were always healed. The patient thinks that the lymphedema may have been present since he was about 52 years old i.e. about 30 years. He does not have graded pressure stockings and has not worn stockings. He does not have [name] distant history of DVT PE or phlebitis. He has not been systemically unwell fever no chills. He states that his Lasix is recently been reduced. He tells me his kidney function is at "30%" and he has been followed by Dr. Thedore Mins of nephrology. At one point he was on Lasix 80 twice [name] day however that's been cut back and he is now on Lasix at 20 twice [name] day. The patient has [name] history of PAD listed in his records although he comes from Dr. Lorretta Harp I don't think is felt to have significant PAD. ABIs in our clinic were noncompressible bilaterally. 01/04/18; patient has [name] large wound on the left lateral lower calf and [name] small wound on the left medial upper calf. He has been to see his nephrologist who changed him to Demadex 40 mg [name] day. I'[name] hopeful this will help with his systemic fluid overload. He also has stage III lymphedema. Really no change in the 2 wounds since last week 01/11/18; the patient is down 13 pounds. He put his stage III lymphedema left leg in 4K compression last week and there is less edema fluid however we still haven't been able to communicate with home health but  works nights [name] [name], [name] [name] ([id number]) [id number] Page 4 of 11 Electronic Signature(s) Signed: 06/15/2023 4:42:07 PM By: Baltazar Najjar MD Entered By: Baltazar Najjar on 06/15/2023 05:33:46 -------------------------------------------------------------------------------- Physical Exam Details Patient Name: Date of Service: [name], CO SLO W [name] 06/15/2023 8:00 [name] [name] Medical Record Number: [id number] Patient Account Number: [id number] Date of Birth/Sex: Treating RN: 1971-03-25 (52 y.o. [name] [name] Primary Care Provider: [name] Other Clinician: Referring Provider: Treating Provider/Extender: RO BSO N, [name] [name] in Treatment: 17 Constitutional Patient is hypertensive.. Pulse regular and within target range for patient.[name] Respirations regular, non-labored and within target range.. Temperature is normal and within the target range for the patient.[name] appears in no distress. Cardiovascular Pedal pulses are palpable. Moderate amount of nonpitting edema. Notes Wound exam; circular wound in the left mid anterior lower leg. The edema control here is mediocre. Surface of the wound however looks healthy. No evidence of surrounding infection. No debridement was necessary. Electronic Signature(s) Signed: 06/15/2023 4:42:07 PM By: Baltazar Najjar MD Entered By: Baltazar Najjar on 06/15/2023 05:35:29 -------------------------------------------------------------------------------- Physician Orders Details Patient Name: Date of Service: [name], CO SLO W [name] 06/15/2023 8:00 [name] [name] Medical Record Number: [id number] Patient Account Number: [id number] Date of Birth/Sex: Treating RN: 10/19/1970 (52 y.o. [name] [name] Primary Care Provider: [name] Other Clinician: Referring Provider: Treating Provider/Extender: RO BSO [name], [name] [name] in Treatment: [id number] Verbal / Phone  Orders: No Diagnosis Coding Follow-up Appointments Return Appointment in 1 week. Home Health Home Health Company: - [facility] for wound care. May utilize formulary equivalent dressing for wound treatment orders unless otherwise specified. Home Health Nurse may visit PRN to address patients wound care needs. [name] [phone] [name] [name], [name] [name] ([id number]) [id number] Page 5 of 11 Change on Monday and Friday. Wound Care Center will change on Wednesday Scheduled days for dressing changes to be completed; exception, patient has scheduled wound care visit that day. **Please direct any NON-WOUND related issues/requests for orders to patient's Primary Care Physician. **If current dressing causes regression in wound condition, may D/C ordered dressing product/s and apply Normal Saline Moist Dressing daily until next Wound Healing Center or Other MD appointment. **Notify Wound Healing Center of regression in wound condition at [phone]. Bathing/ Shower/ Hygiene May shower with wound dressing protected with water repellent cover or cast protector. No tub bath. Anesthetic (Use 'Patient Medications' Section for Anesthetic Order Entry) Lidocaine applied to wound bed Edema Control - Lymphedema / Segmental Compressive Device / Other Optional: One layer of unna paste to top of compression wrap (to act as an anchor). Tubigrip double layer applied - right leg Elevate, Exercise Daily and [name] void Standing for Long Periods of Time. Elevate legs to the level of the heart and pump ankles as often as possible Elevate leg(s) parallel to the floor when sitting. Wound Treatment Wound #22 - Lower Leg Wound Laterality: Left Cleanser: Soap and Water 3 x Per Week/30 Days Discharge Instructions: Gently cleanse wound with antibacterial soap, rinse and pat dry prior to dressing wounds Cleanser: Wound Cleanser 3 x Per Week/30 Days Discharge Instructions: Wash your hands  with soap and water. Remove old dressing, discard into plastic bag and place into trash. Cleanse the wound with Wound Cleanser prior to applying [name] clean dressing using gauze sponges, not tissues or cotton balls. Do not scrub or use excessive force. Pat dry using gauze sponges, not tissue or cotton balls. Prim Dressing: Hydrofera Blue Ready Transfer Foam,

## 2023-06-17 NOTE — Progress Notes (Signed)
[name], [name] ([id number]) [id number] Page 1 of 9 Visit Report for 06/15/2023 Arrival Information Details Patient Name: Date of Service: [name] 06/15/2023 8:00 [name] Medical Record Number: [id number] Patient Account Number: [id number] Date of Birth/Sex: Treating RN: 03-06-1971 (52 y.o. [name]) [name] Primary Care [name]: [name] Other Clinician: Referring [name]: Treating [name]/Extender: [name] in Treatment: 17 Visit Information History Since Last Visit Added or deleted any medications: No Patient Arrived: Ambulatory Any new allergies or adverse reactions: No Arrival Time: 08:05 Had [name] fall or experienced change in No Accompanied By: self activities of daily living that may affect Transfer Assistance: None risk of falls: Patient Identification Verified: Yes Signs or symptoms of abuse/neglect since last visito No Secondary Verification Process Completed: Yes Hospitalized since last visit: No Patient Requires Transmission-Based Precautions: No Implantable device outside of the clinic excluding No Patient Has Alerts: Yes cellular tissue based products placed in the center Patient Alerts: Patient on Blood Thinner since last visit: ABI R [place] TBI .61 10/11/22 Has Dressing in Place as Prescribed: Yes ABI L [place] TBI .33 10/11/22 Has Compression in Place as Prescribed: Yes Pain Present Now: No Electronic Signature(s) Signed: 06/17/2023 11:41:18 AM By: [name] Pax RN Entered By: [name] on 06/15/2023 05:05:23 -------------------------------------------------------------------------------- Clinic Level of Care Assessment Details Patient Name: Date of Service: [name] LT, CO [name] 06/15/2023 8:00 [name] Medical Record Number: [id number] Patient Account Number: [id number] Date of Birth/Sex: Treating RN: 03-Jul-1971 (52 y.o. [name] Primary Care Corwin Kuiken: [name] Other  Clinician: Referring Olivea Sonnen: Treating Labria Wos/Extender: [name] in Treatment: 17 Clinic Level of Care Assessment Items TOOL 1 Quantity Score []  - 0 Use when EandM and Procedure is performed on INITIAL visit ASSESSMENTS - Nursing Assessment / Reassessment []  - 0 General Physical Exam (combine w/ comprehensive assessment (listed just below) when performed on new pt. evals) []  - 0 Comprehensive Assessment (HX, ROS, Risk Assessments, Wounds Hx, etc.) [name], [name] ([id number]) [id number] Page 2 of 9 ASSESSMENTS - Wound and Skin Assessment / Reassessment []  - 0 Dermatologic / Skin Assessment (not related to wound area) ASSESSMENTS - Ostomy and/or Continence Assessment and Care []  - 0 Incontinence Assessment and Management []  - 0 Ostomy Care Assessment and Management (repouching, etc.) PROCESS - Coordination of Care []  - 0 Simple Patient / Family Education for ongoing care []  - 0 Complex (extensive) Patient / Family Education for ongoing care []  - 0 Staff obtains Chiropractor, Records, T Results / Process Orders est []  - 0 Staff telephones HHA, Nursing Homes / Clarify orders / etc []  - 0 Routine Transfer to another Facility (non-emergent condition) []  - 0 Routine Hospital Admission (non-emergent condition) []  - 0 New Admissions / Manufacturing engineer / Ordering NPWT Apligraf, etc. , []  - 0 Emergency Hospital Admission (emergent condition) PROCESS - Special Needs []  - 0 Pediatric / Minor Patient Management []  - 0 Isolation Patient Management []  - 0 Hearing / Language / Visual special needs []  - 0 Assessment of Community assistance (transportation, D/C planning, etc.) []  - 0 Additional assistance / Altered mentation []  - 0 Support Surface(s) Assessment (bed, cushion, seat, etc.) INTERVENTIONS - Miscellaneous []  - 0 External ear exam []  - 0 Patient Transfer (multiple staff / Nurse, adult / Similar  devices) []  - 0 Simple Staple / Suture removal (25 or less) []  - 0 Complex Staple / Suture removal (26 or  [name], [name] ([id number]) [id number] Page 1 of 9 Visit Report for 06/15/2023 Arrival Information Details Patient Name: Date of Service: [name] 06/15/2023 8:00 [name] Medical Record Number: [id number] Patient Account Number: [id number] Date of Birth/Sex: Treating RN: 03-06-1971 (52 y.o. [name]) [name] Primary Care [name]: [name] Other Clinician: Referring [name]: Treating [name]/Extender: [name] in Treatment: 17 Visit Information History Since Last Visit Added or deleted any medications: No Patient Arrived: Ambulatory Any new allergies or adverse reactions: No Arrival Time: 08:05 Had [name] fall or experienced change in No Accompanied By: self activities of daily living that may affect Transfer Assistance: None risk of falls: Patient Identification Verified: Yes Signs or symptoms of abuse/neglect since last visito No Secondary Verification Process Completed: Yes Hospitalized since last visit: No Patient Requires Transmission-Based Precautions: No Implantable device outside of the clinic excluding No Patient Has Alerts: Yes cellular tissue based products placed in the center Patient Alerts: Patient on Blood Thinner since last visit: ABI R [place] TBI .61 10/11/22 Has Dressing in Place as Prescribed: Yes ABI L [place] TBI .33 10/11/22 Has Compression in Place as Prescribed: Yes Pain Present Now: No Electronic Signature(s) Signed: 06/17/2023 11:41:18 AM By: [name] Pax RN Entered By: [name] on 06/15/2023 05:05:23 -------------------------------------------------------------------------------- Clinic Level of Care Assessment Details Patient Name: Date of Service: [name] LT, CO [name] 06/15/2023 8:00 [name] Medical Record Number: [id number] Patient Account Number: [id number] Date of Birth/Sex: Treating RN: 03-Jul-1971 (52 y.o. [name] Primary Care Corwin Kuiken: [name] Other  Clinician: Referring Olivea Sonnen: Treating Labria Wos/Extender: [name] in Treatment: 17 Clinic Level of Care Assessment Items TOOL 1 Quantity Score []  - 0 Use when EandM and Procedure is performed on INITIAL visit ASSESSMENTS - Nursing Assessment / Reassessment []  - 0 General Physical Exam (combine w/ comprehensive assessment (listed just below) when performed on new pt. evals) []  - 0 Comprehensive Assessment (HX, ROS, Risk Assessments, Wounds Hx, etc.) [name], [name] ([id number]) [id number] Page 2 of 9 ASSESSMENTS - Wound and Skin Assessment / Reassessment []  - 0 Dermatologic / Skin Assessment (not related to wound area) ASSESSMENTS - Ostomy and/or Continence Assessment and Care []  - 0 Incontinence Assessment and Management []  - 0 Ostomy Care Assessment and Management (repouching, etc.) PROCESS - Coordination of Care []  - 0 Simple Patient / Family Education for ongoing care []  - 0 Complex (extensive) Patient / Family Education for ongoing care []  - 0 Staff obtains Chiropractor, Records, T Results / Process Orders est []  - 0 Staff telephones HHA, Nursing Homes / Clarify orders / etc []  - 0 Routine Transfer to another Facility (non-emergent condition) []  - 0 Routine Hospital Admission (non-emergent condition) []  - 0 New Admissions / Manufacturing engineer / Ordering NPWT Apligraf, etc. , []  - 0 Emergency Hospital Admission (emergent condition) PROCESS - Special Needs []  - 0 Pediatric / Minor Patient Management []  - 0 Isolation Patient Management []  - 0 Hearing / Language / Visual special needs []  - 0 Assessment of Community assistance (transportation, D/C planning, etc.) []  - 0 Additional assistance / Altered mentation []  - 0 Support Surface(s) Assessment (bed, cushion, seat, etc.) INTERVENTIONS - Miscellaneous []  - 0 External ear exam []  - 0 Patient Transfer (multiple staff / Nurse, adult / Similar  devices) []  - 0 Simple Staple / Suture removal (25 or less) []  - 0 Complex Staple / Suture removal (26 or  N/[name] N/[name] Date Acquired: 45 N/[name] N/[name] Weeks of Treatment: Open N/[name] N/[name] Wound Status: No N/[name] N/[name] Wound Recurrence: 1.2x1.6x0.1 N/[name] N/[name] Measurements L x W x D (cm) 1.508 N/[name] N/[name] (cm) : rea 0.151 N/[name] N/[name] Volume (cm) : 98.30% N/[name] N/[name] % Reduction in [name] rea: 99.70% N/[name] N/[name] % Reduction in Volume: Grade 2 N/[name] N/[name] Classification: Medium N/[name] N/[name] Exudate [name] mount: Serosanguineous N/[name] N/[name] Exudate Type: red, brown N/[name] N/[name] Exudate Color: Large (67-100%) N/[name] N/[name] Granulation [name] mount: Red N/[name] N/[name] Granulation Quality: Small (1-33%) N/[name] N/[name] Necrotic [name] mount: Fat Layer (Subcutaneous Tissue): Yes N/[name] N/[name] Exposed Structures: Fascia: No Tendon: No Muscle: No Joint: No Bone: No None N/[name] N/[name] Epithelialization: Treatment Notes Electronic Signature(s) Signed: 06/17/2023 11:41:18 AM By: [name] Pax RN Entered By: [name] on 06/15/2023 05:11:58 -------------------------------------------------------------------------------- Multi-Disciplinary Care Plan Details Patient Name: Date of Service: [name] LT, CO [name] 06/15/2023 8:00 [name] Medical Record Number: [id number] Patient Account Number:  [id number] Date of Birth/Sex: Treating RN: 1971-07-19 (52 y.o. [name] Primary Care Connor Foxworthy: [name] Other Clinician: Referring Trilby Way: Treating Adron Geisel/Extender: [name] in Treatment: 17 Active Inactive Wound/Skin Impairment Nursing Diagnoses: Knowledge deficit related to ulceration/compromised skin integrity Goals: [name], [name] ([id number]) [id number] Page 6 of 9 Patient/caregiver will verbalize understanding of skin care regimen Date Initiated: 03/09/2023 Date Inactivated: 04/13/2023 Target Resolution Date: 04/09/2023 Goal Status: Met Ulcer/skin breakdown will have [name] volume reduction of 30% by week 4 Date Initiated: 03/09/2023 Date Inactivated: 04/13/2023 Target Resolution Date: 04/09/2023 Goal Status: Met Ulcer/skin breakdown will have [name] volume reduction of 50% by week 8 Date Initiated: 03/09/2023 Date Inactivated: 05/18/2023 Target Resolution Date: 05/10/2023 Goal Status: Met Ulcer/skin breakdown will have [name] volume reduction of 80% by week 12 Date Initiated: 03/09/2023 Target Resolution Date: 06/09/2023 Goal Status: Active Ulcer/skin breakdown will heal within 14 weeks Date Initiated: 03/09/2023 Target Resolution Date: 07/10/2023 Goal Status: Active Interventions: Assess patient/caregiver ability to obtain necessary supplies Assess patient/caregiver ability to perform ulcer/skin care regimen upon admission and as needed Assess ulceration(s) every visit Notes: Electronic Signature(s) Signed: 06/17/2023 11:41:18 AM By: [name] Pax RN Entered By: [name] on 06/15/2023 05:12:17 -------------------------------------------------------------------------------- Pain Assessment Details Patient Name: Date of Service: [name] LT, CO [name] 06/15/2023 8:00 [name] Medical Record Number: [id number] Patient Account Number: [id number] Date of Birth/Sex: Treating RN: 19-Sep-1970 (52 y.o. [name] Primary Care Sheily Lineman: [name] Other Clinician: Referring Dajour Pierpoint: Treating Kobi Aller/Extender: [name] in Treatment: 17 Active Problems Location of Pain Severity and Description of Pain Patient Has Paino No Site Locations Pain Management and Medication Current Pain Management: [name], [name] ([id number]) [id number] Page 7 of 9 Electronic Signature(s) Signed: 06/17/2023 11:41:18 AM By: [name] Pax RN Entered By: [name] on 06/15/2023 05:09:38 -------------------------------------------------------------------------------- Patient/Caregiver Education Details Patient Name: Date of Service: [name] LT, CO [name] 10/16/2024andnbsp8:00 [name] Medical Record Number: [id number] Patient Account Number: [id number] Date of Birth/Gender: Treating RN: 05/20/1971 (52 y.o. [name] Primary Care Physician: [name] Other Clinician: Referring Physician: Treating Physician/Extender: [name] BSO [name], MICHA EL San [name] in Treatment: 17 Education Assessment Education Provided To: Patient Education Topics Provided Wound/Skin Impairment: Handouts: Other: compression pumps Methods: Explain/Verbal Responses: State content correctly Electronic Signature(s) Signed: 06/17/2023 11:41:18 AM By: [name] Pax RN Entered By: [name] on 06/15/2023 05:12:35 -------------------------------------------------------------------------------- Wound Assessment Details Patient Name: Date of Service: [name] LT,  CO [name] 06/15/2023 8:00 [name] Medical Record Number: [id number] Patient Account Number: [id number] Date of Birth/Sex: Treating RN: 10/30/1970 (52 y.o. [name]) [name] Primary Care Grayson Pfefferle: [name] Other Clinician: Referring Elysha Daw: Treating Haleema Vanderheyden/Extender: [name] in Treatment: 17 Wound Status Wound Number: 22 Primary Etiology: Diabetic Wound/Ulcer  of the Lower Extremity Wound Location: Left Lower Leg Wound Status: Open Wounding Event: Gradually Appeared Comorbid History: Hypertension, Type II Diabetes, Neuropathy Date Acquired: 08/30/2022 [name], [name] ([id number]) [phone].pdf Page 8 of 9 Weeks Of Treatment: 17 Clustered Wound: No Photos Wound Measurements Length: (cm) 1.2 Width: (cm) 1.6 Depth: (cm) 0.1 Area: (cm) 1.508 Volume: (cm) 0.151 % Reduction in Area: 98.3% % Reduction in Volume: 99.7% Epithelialization: None Tunneling: No Undermining: No Wound Description Classification: Grade 2 Exudate Amount: Medium Exudate Type: Serosanguineous Exudate Color: red, brown Foul Odor After Cleansing: No Slough/Fibrino Yes Wound Bed Granulation Amount: Large (67-100%) Exposed Structure Granulation Quality: Red Fascia Exposed: No Necrotic Amount: Small (1-33%) Fat Layer (Subcutaneous Tissue) Exposed: Yes Necrotic Quality: Adherent Slough Tendon Exposed: No Muscle Exposed: No Joint Exposed: No Bone Exposed: No Treatment Notes Wound #22 (Lower Leg) Wound Laterality: Left Cleanser Soap and Water Discharge Instruction: Gently cleanse wound with antibacterial soap, rinse and pat dry prior to dressing wounds Wound Cleanser Discharge Instruction: Wash your hands with soap and water. Remove old dressing, discard into plastic bag and place into trash. Cleanse the wound with Wound Cleanser prior to applying [name] clean dressing using gauze sponges, not tissues or cotton balls. Do not scrub or use excessive force. Pat dry using gauze sponges, not tissue or cotton balls. Peri-Wound Care Topical Primary Dressing Hydrofera Blue Ready Transfer Foam, 2.5x2.5 (in/in) Discharge Instruction: Apply Hydrofera Blue Ready to wound bed as directed Secondary Dressing Zetuvit Plus 4x4 (in/in) Secured With [employer] Dressing, Latex-free, Size 5, Small-Head / Shoulder / Thigh Discharge Instruction: over wrap for  comfort Compression Wrap Urgo K2, two layer compression system, large Compression Stockings Add-Ons Electronic Signature(s) Signed: 06/17/2023 11:41:18 AM By: [name] Pax RN [name], [name] ([id number]) [id number] Page 9 of 9 Entered By: [name] on 06/15/2023 05:10:44 -------------------------------------------------------------------------------- Vitals Details Patient Name: Date of Service: [name] LT, CO [name] 06/15/2023 8:00 [name] Medical Record Number: [id number] Patient Account Number: [id number] Date of Birth/Sex: Treating RN: [birth year] (52 y.o. [name]) [name] Primary Care Martrell Eguia: [name] Other Clinician: Referring Colie Josten: Treating Shahed Yeoman/Extender: [name] in Treatment: 17 Vital Signs Time Taken: 08:05 Temperature (F): 98.2 Height (in): 76 Pulse (bpm): 85 Weight (lbs): 400 Respiratory Rate (breaths/min): 16 Body Mass Index (BMI): 48.7 Blood Pressure (mmHg): 147/86 Reference Range: 80 - 120 mg / dl Electronic Signature(s) Signed: 06/17/2023 11:41:18 AM By: [name] Pax RN Entered By: [name] on 06/15/2023 05:05:43

## 2023-06-18 DIAGNOSIS — N2581 Secondary hyperparathyroidism of renal origin: Secondary | ICD-10-CM | POA: Diagnosis not present

## 2023-06-18 DIAGNOSIS — Z992 Dependence on renal dialysis: Secondary | ICD-10-CM | POA: Diagnosis not present

## 2023-06-18 DIAGNOSIS — N186 End stage renal disease: Secondary | ICD-10-CM | POA: Diagnosis not present

## 2023-06-20 DIAGNOSIS — N186 End stage renal disease: Secondary | ICD-10-CM | POA: Diagnosis not present

## 2023-06-20 DIAGNOSIS — E1065 Type 1 diabetes mellitus with hyperglycemia: Secondary | ICD-10-CM | POA: Diagnosis not present

## 2023-06-20 DIAGNOSIS — I872 Venous insufficiency (chronic) (peripheral): Secondary | ICD-10-CM | POA: Diagnosis not present

## 2023-06-20 DIAGNOSIS — L97922 Non-pressure chronic ulcer of unspecified part of left lower leg with fat layer exposed: Secondary | ICD-10-CM | POA: Diagnosis not present

## 2023-06-20 DIAGNOSIS — I120 Hypertensive chronic kidney disease with stage 5 chronic kidney disease or end stage renal disease: Secondary | ICD-10-CM | POA: Diagnosis not present

## 2023-06-20 DIAGNOSIS — E1022 Type 1 diabetes mellitus with diabetic chronic kidney disease: Secondary | ICD-10-CM | POA: Diagnosis not present

## 2023-06-20 DIAGNOSIS — I499 Cardiac arrhythmia, unspecified: Secondary | ICD-10-CM | POA: Diagnosis not present

## 2023-06-20 DIAGNOSIS — I890 Lymphedema, not elsewhere classified: Secondary | ICD-10-CM | POA: Diagnosis not present

## 2023-06-20 DIAGNOSIS — E1051 Type 1 diabetes mellitus with diabetic peripheral angiopathy without gangrene: Secondary | ICD-10-CM | POA: Diagnosis not present

## 2023-06-21 DIAGNOSIS — N2581 Secondary hyperparathyroidism of renal origin: Secondary | ICD-10-CM | POA: Diagnosis not present

## 2023-06-21 DIAGNOSIS — Z992 Dependence on renal dialysis: Secondary | ICD-10-CM | POA: Diagnosis not present

## 2023-06-21 DIAGNOSIS — N186 End stage renal disease: Secondary | ICD-10-CM | POA: Diagnosis not present

## 2023-06-22 ENCOUNTER — Encounter: Payer: Medicare HMO | Admitting: Internal Medicine

## 2023-06-22 DIAGNOSIS — E11622 Type 2 diabetes mellitus with other skin ulcer: Secondary | ICD-10-CM | POA: Diagnosis not present

## 2023-06-22 DIAGNOSIS — I120 Hypertensive chronic kidney disease with stage 5 chronic kidney disease or end stage renal disease: Secondary | ICD-10-CM | POA: Diagnosis not present

## 2023-06-22 DIAGNOSIS — N186 End stage renal disease: Secondary | ICD-10-CM | POA: Diagnosis not present

## 2023-06-22 DIAGNOSIS — L97822 Non-pressure chronic ulcer of other part of left lower leg with fat layer exposed: Secondary | ICD-10-CM | POA: Diagnosis not present

## 2023-06-22 DIAGNOSIS — Z992 Dependence on renal dialysis: Secondary | ICD-10-CM | POA: Diagnosis not present

## 2023-06-22 DIAGNOSIS — E1065 Type 1 diabetes mellitus with hyperglycemia: Secondary | ICD-10-CM | POA: Diagnosis not present

## 2023-06-22 DIAGNOSIS — I872 Venous insufficiency (chronic) (peripheral): Secondary | ICD-10-CM | POA: Diagnosis not present

## 2023-06-22 DIAGNOSIS — E1022 Type 1 diabetes mellitus with diabetic chronic kidney disease: Secondary | ICD-10-CM | POA: Diagnosis not present

## 2023-06-22 DIAGNOSIS — L97922 Non-pressure chronic ulcer of unspecified part of left lower leg with fat layer exposed: Secondary | ICD-10-CM | POA: Diagnosis not present

## 2023-06-22 DIAGNOSIS — I890 Lymphedema, not elsewhere classified: Secondary | ICD-10-CM | POA: Diagnosis not present

## 2023-06-22 DIAGNOSIS — Z794 Long term (current) use of insulin: Secondary | ICD-10-CM | POA: Diagnosis not present

## 2023-06-22 DIAGNOSIS — Z792 Long term (current) use of antibiotics: Secondary | ICD-10-CM | POA: Diagnosis not present

## 2023-06-22 DIAGNOSIS — E1122 Type 2 diabetes mellitus with diabetic chronic kidney disease: Secondary | ICD-10-CM | POA: Diagnosis not present

## 2023-06-22 DIAGNOSIS — E1051 Type 1 diabetes mellitus with diabetic peripheral angiopathy without gangrene: Secondary | ICD-10-CM | POA: Diagnosis not present

## 2023-06-22 NOTE — Progress Notes (Signed)
06/27/23 9:33 AM   [name] 08-07-1971 [id number]  Referring provider:  Marina Goodell, MD [address] [place],  [place] [address]   Urological history:  1. Increased prolactin  - pituitary MRI (11/2022) - No convincing evidence of pituitary adenoma on the current study - followed by endocrinology, last seen 01/2023    2. Testosterone deficiency  - testosterone level 294, 07/2021  - followed by endocrinology     3. ED  - contributing factors of age, testosterone deficiency, DM, HTN, sleep apnea (untreated), history of smoking and ESRD  - did not tolerate the ICI - sildenafil 50 mg, on-demand-dosing    4. BPH  -PSA 0.58, 07/2021     Chief Complaint  Patient presents with   Erectile Dysfunction   Hypogonadism    HPI: [name] is a 52 y.o.male with diabetes and end stage renal disease on dialysis who presents today for follow up with his SO, Debbie.    Previous records reviewed.   He has dialysis on Tuesday, Thursdays and Saturdays.  SHIM 15  Patient still having spontaneous erections.  He denies any pain or curvature with erections.  He is taking the sildenafil 20 mg twice weekly.    SHIM     Row Name 06/27/23 0855         SHIM: Over the last 6 months:   How do you rate your confidence that you could get and keep an erection? Low     When you had erections with sexual stimulation, how often were your erections hard enough for penetration (entering your partner)? Sometimes (about half the time)     During sexual intercourse, how often were you able to maintain your erection after you had penetrated (entered) your partner? Sometimes (about half the time)     During sexual intercourse, how difficult was it to maintain your erection to completion of intercourse? Difficult     When you attempted sexual intercourse, how often was it satisfactory for you? Most Times (much more than half the time)       SHIM Total Score   SHIM 15              Score: 1-7 Severe ED 8-11 Moderate ED 12-16 Mild-Moderate ED 17-21 Mild ED 22-25 No ED    PMH: Past Medical History:  Diagnosis Date   Anemia    Chronic kidney disease    14% FUNCTION   Diabetes mellitus without complication (HCC)    Diabetic retinopathy (HCC)    Dyspnea    DOE   Dysrhythmia    End stage renal disease (HCC)    History of orthopnea    error wrong patietn   Hypercholesteremia    error   Hyperkalemia    error wrong patient   Hypertension    Long-term insulin use (HCC)    Lymphedema of leg    Metabolic encephalopathy    error   Microalbuminuria    MSSA (methicillin susceptible Staphylococcus aureus)    Obesity    PVD (peripheral vascular disease) (HCC)    Respiratory failure (HCC)    Error   Sepsis (HCC)    error   Sleep apnea    no CPAP   UTI (urinary tract infection)    error   Venous ulcer (HCC)    Vitamin D deficiency     Surgical History: Past Surgical History:  Procedure Laterality Date   A/V FISTULAGRAM Right 10/24/2018   Procedure: A/V FISTULAGRAM;  Surgeon: Renford Dills, MD;  Location: [facility] INVASIVE CV LAB;  Service: Cardiovascular;  Laterality: Right;   A/V FISTULAGRAM Right 11/24/2018   Procedure: A/V FISTULAGRAM;  Surgeon: Renford Dills, MD;  Location: [facility] INVASIVE CV LAB;  Service: Cardiovascular;  Laterality: Right;   A/V FISTULAGRAM Left 07/31/2019   Procedure: A/V FISTULAGRAM;  Surgeon: Renford Dills, MD;  Location: [facility] INVASIVE CV LAB;  Service: Cardiovascular;  Laterality: Left;   A/V FISTULAGRAM Left 12/11/2019   Procedure: A/V FISTULAGRAM;  Surgeon: Renford Dills, MD;  Location: [facility] INVASIVE CV LAB;  Service: Cardiovascular;  Laterality: Left;   A/V SHUNT INTERVENTION Right 02/21/2019   Procedure: A/V SHUNT INTERVENTION;  Surgeon: Renford Dills, MD;  Location: [facility] INVASIVE CV LAB;  Service: Cardiovascular;  Laterality: Right;   APPLICATION OF WOUND VAC Left 10/03/2017   Procedure: APPLICATION OF  WOUND VAC;  Surgeon: Annice Needy, MD;  Location: [facility] ORS;  Service: General;  Laterality: Left;   APPLICATION OF WOUND VAC Left 10/11/2017   Procedure: APPLICATION OF WOUND VAC;  Surgeon: Renford Dills, MD;  Location: [facility] ORS;  Service: Vascular;  Laterality: Left;   APPLICATION OF WOUND VAC Left 10/14/2017   Procedure: WOUND VAC CHANGE;  Surgeon: Renford Dills, MD;  Location: [facility] ORS;  Service: Vascular;  Laterality: Left;  left lower leg   APPLICATION OF WOUND VAC Left 10/18/2017   Procedure: WOUND VAC CHANGE;  Surgeon: Renford Dills, MD;  Location: [facility] ORS;  Service: Vascular;  Laterality: Left;   APPLICATION OF WOUND VAC Left 10/07/2017   Procedure: APPLICATION OF WOUND VAC;  Surgeon: Renford Dills, MD;  Location: [facility] ORS;  Service: Vascular;  Laterality: Left;   APPLICATION OF WOUND VAC Left 11/17/2022   Procedure: Wound vac exchange;  Surgeon: Renford Dills, MD;  Location: [facility] ORS;  Service: Vascular;  Laterality: Left;   APPLICATION OF WOUND VAC Left 11/10/2022   Procedure: APPLICATION OF WOUND VAC;  Surgeon: Renford Dills, MD;  Location: [facility] ORS;  Service: Vascular;  Laterality: Left;   APPLICATION OF WOUND VAC Left 11/24/2022   Procedure: WOUND VAC REMOVAL;  Surgeon: Renford Dills, MD;  Location: [facility] ORS;  Service: Vascular;  Laterality: Left;   APPLICATION OF WOUND VAC Left 11/12/2022   Procedure: APPLICATION OF WOUND VAC;  Surgeon: Renford Dills, MD;  Location: [facility] OR[facility];  Service: Vascular;  Laterality: Left;   APPLICATION OF WOUND VAC Left 01/06/2023   Procedure: APPLICATION OF WOUND VAC;  Surgeon: Renford Dills, MD;  Location: [facility] ORS;  Service: Vascular;  Laterality: Left;   AV FISTULA INSERTION W/ RF MAGNETIC GUIDANCE Right 07/19/2018   Procedure: AV FISTULA INSERTION W/RF MAGNETIC GUIDANCE;  Surgeon: Renford Dills, MD;  Location: [facility] INVASIVE CV LAB;  Service: Cardiovascular;  Laterality: Right;   AV FISTULA PLACEMENT Left  05/11/2019   Procedure: ARTERIOVENOUS (AV) FISTULA CREATION (RADIOCEPHALIC );  Surgeon: Renford Dills, MD;  Location: [facility] OR[facility];  Service: Vascular;  Laterality: Left;   AV FISTULA PLACEMENT Left 02/20/2020   Procedure: INSERTION OF ARTERIOVENOUS (AV) GORE-TEX GRAFT LEFT ARM ( FOREARM LOOP );  Surgeon: Renford Dills, MD;  Location: [facility] OR[facility];  Service: Vascular;  Laterality: Left;   CATARACT EXTRACTION W/PHACO Left 11/01/2018   Procedure: CATARACT EXTRACTION PHACO AND INTRAOCULAR LENS PLACEMENT (IOC)-LEFT, DIABETIC-INSULIN DEPENDENT;  Surgeon: Nevada Crane, MD;  Location: [facility] ORS;  Service: Ophthalmology;  Laterality: Left;  [place] 00:41.8 CDE 4.61 Fluid Pack Lot #  [id number]   CATARACT EXTRACTION W/PHACO Right 04/27/2019   Procedure: CATARACT EXTRACTION PHACO AND INTRAOCULAR LENS PLACEMENT (IOC);  Surgeon: Nevada Crane, MD;  Location: [facility] OR[facility];  Service: Ophthalmology;  Laterality: Right;  [place] 00:34 CDE 1.97 FLUID PACK LOT # [id number]    COLONOSCOPY WITH PROPOFOL N/A 05/12/2020   Procedure: COLONOSCOPY WITH PROPOFOL;  Surgeon: Tol[place], Boykin Nearing, MD;  Location: [facility] ENDOSCOPY;  Service: Gastroenterology;  Laterality: N/A;   DIALYSIS/PERMA CATHETER INSERTION N/A 02/23/2019   Procedure: DIALYSIS/PERMA CATHETER INSERTION;  Surgeon: Annice Needy, MD;  Location: [facility] INVASIVE CV LAB;  Service: Cardiovascular;  Laterality: N/A;   DIALYSIS/PERMA CATHETER REMOVAL N/A 04/03/2020   Procedure: DIALYSIS/PERMA CATHETER REMOVAL;  Surgeon: Renford Dills, MD;  Location: [facility] INVASIVE CV LAB;  Service: Cardiovascular;  Laterality: N/A;   DIALYSIS/PERMA CATHETER REMOVAL N/A 02/03/2022   Procedure: DIALYSIS/PERMA CATHETER REMOVAL;  Surgeon: Renford Dills, MD;  Location: [facility] INVASIVE CV LAB;  Service: Cardiovascular;  Laterality: N/A;   I & D EXTREMITY Left 10/11/2017   Procedure: IRRIGATION AND DEBRIDEMENT EXTREMITY;  Surgeon: Renford Dills, MD;  Location: [facility] OR[facility];  Service: Vascular;   Laterality: Left;   INCISION AND DRAINAGE ABSCESS Right 10/07/2017   Procedure: INCISION AND DRAINAGE ABSCESS;  Surgeon: Renford Dills, MD;  Location: [facility] OR[facility];  Service: Vascular;  Laterality: Right;   INCISION AND DRAINAGE OF WOUND Left 11/17/2022   Procedure: DEBRIDEMENT WOUND;  Surgeon: Renford Dills, MD;  Location: [facility];  Service: Vascular;  Laterality: Left;   INCISION AND DRAINAGE OF WOUND Left 11/24/2022   Procedure: DEBRIDEMENT WOUND LEFT LOWER EXTREMITY;  Surgeon: Renford Dills, MD;  Location: [facility] OR[facility];  Service: Vascular;  Laterality: Left;   IRRIGATION AND DEBRIDEMENT ABSCESS Left 10/03/2017   Procedure: IRRIGATION AND DEBRIDEMENT ABSCESS with debridement of skin, soft tissue, muscle 50sq cm;  Surgeon: Annice Needy, MD;  Location: [facility] OR[facility];  Service: General;  Laterality: Left;   LOWER EXTREMITY ANGIOGRAPHY Left 10/15/2022   Procedure: Lower Extremity Angiography;  Surgeon: Renford Dills, MD;  Location: [facility] INVASIVE CV LAB;  Service: Cardiovascular;  Laterality: Left;   TEE WITHOUT CARDIOVERSION N/A 03/29/2023   Procedure: TRANSESOPHAGEAL ECHOCARDIOGRAM;  Surgeon: Antonieta Iba, MD;  Location: [facility];  Service: Cardiovascular;  Laterality: N/A;   TEMPORARY DIALYSIS CATHETER  02/20/2019   Procedure: TEMPORARY DIALYSIS CATHETER;  Surgeon: Renford Dills, MD;  Location: [facility] INVASIVE CV LAB;  Service: Cardiovascular;;   UPPER EXTREMITY ANGIOGRAPHY Left 09/18/2019   Procedure: UPPER EXTREMITY ANGIOGRAPHY;  Surgeon: Renford Dills, MD;  Location: [facility] INVASIVE CV LAB;  Service: Cardiovascular;  Laterality: Left;   WOUND DEBRIDEMENT Left 11/10/2022   Procedure: DEBRIDEMENT WOUND;  Surgeon: Renford Dills, MD;  Location: [facility];  Service: Vascular;  Laterality: Left;   WOUND DEBRIDEMENT Left 11/12/2022   Procedure: DEBRIDEMENT WOUND;  Surgeon: Renford Dills, MD;  Location: [facility];  Service: Vascular;  Laterality: Left;   WOUND DEBRIDEMENT Left  01/06/2023   Procedure: DEBRIDEMENT WOUND;  Surgeon: Renford Dills, MD;  Location: [facility] OR[facility];  Service: Vascular;  Laterality: Left;    Home Medications:  Allergies as of 06/27/2023       Reactions   Lisinopril Swelling   Lips mouth    Furosemide Cough   Light headed, blurry vision, weakness.   Latex Itching   Silicone Itching   Adhesive [tape] Itching, Rash        Medication List  Accurate as of June 27, 2023  9:33 AM. If you have any questions, ask your nurse or doctor.          Accu-Chek Guide test strip Generic drug: glucose blood USE 1 STRIP TOTAL 2 TIMES DAILY FOR 90 DAYS   acetaminophen 325 MG tablet Commonly known as: TYLENOL Take 2 tablets (650 mg total) by mouth every 6 (six) hours as needed for mild pain (or Fever >/= 101).   allopurinol 100 MG tablet Commonly known as: ZYLOPRIM Take 100 mg by mouth daily.   amLODipine 10 MG tablet Commonly known as: NORVASC Take 5 mg by mouth daily as needed (if bp is 190/90 or higher).   apixaban 5 MG Tabs tablet Commonly known as: ELIQUIS Take 1 tablet (5 mg total) by mouth 2 (two) times daily.   atenolol 50 MG tablet Commonly known as: TENORMIN Take 25 mg by mouth daily as needed (if bp is 190/90 or higher).   atorvastatin 80 MG tablet Commonly known as: LIPITOR Take 80 mg by mouth at bedtime.   calcium acetate 667 MG capsule Commonly known as: PHOSLO Take 1,334-2,001 mg by mouth See admin instructions. Take 2001 mg by mouth with each meal & take 1334 mg by mouth with each snack.   cinacalcet 30 MG tablet Commonly known as: SENSIPAR Take 30 mg by mouth daily.   CINNAMON PO Take 500 mg by mouth 2 (two) times daily.   clopidogrel 75 MG tablet Commonly known as: PLAVIX TAKE 1 TABLET BY MOUTH EVERY DAY   Dexcom G7 Sensor Misc USE 1 EACH EVERY 10 (TEN) DAYS   EYE HEALTH PO Take 1 capsule by mouth daily.   Lantus SoloStar 100 UNIT/ML Solostar Pen Generic drug: insulin glargine Inject  20 Units into the skin at bedtime.   lidocaine-prilocaine cream Commonly known as: EMLA Apply 1 application topically as needed (port access).   Melatonin 5 MG Caps Take 1 capsule by mouth at bedtime as needed.   multivitamin with minerals Tabs tablet Take 1 tablet by mouth 3 (three) times a week. Men's Multivitamin   Rena-Vite Rx 1 MG Tabs Take 1 tablet by mouth 3 (three) times a week.   sildenafil 20 MG tablet Commonly known as: REVATIO Take 1 tablet daily.  Do not take with nitrates.   SYSTANE OP Place 1 drop into both eyes daily as needed (dry eyes).   traZODone 50 MG tablet Commonly known as: DESYREL Take 50 mg by mouth at bedtime.        Allergies:  Allergies  Allergen Reactions   Lisinopril Swelling    Lips mouth    Furosemide Cough    Light headed, blurry vision, weakness.   Latex Itching   Silicone Itching   Adhesive [Tape] Itching and Rash    Family History: Family History  Problem Relation Age of Onset   Diabetes Father    Kidney disease Father    Diabetes Daughter     Social History:  reports that he has quit smoking. His smoking use included cigars. He has been exposed to tobacco smoke. He quit smokeless tobacco use about 5 years ago. He reports current alcohol use of about 1.0 standard drink of alcohol per week. He reports that he does not currently use drugs.   Physical Exam: BP (!) 152/76   Pulse 77   Ht 6\' 4"  (1.93 m)   Wt (!) 387 lb (175.5 kg)   BMI 47.11 kg/m   Constitutional:  Well nourished. Alert  and oriented, No acute distress. HEENT: [place] AT, moist mucus membranes.  Trachea midline Cardiovascular: No clubbing, cyanosis, or edema. Respiratory: Normal respiratory effort, no increased work of breathing. Neurologic: Grossly intact, no focal deficits, moving all 4 extremities. Psychiatric: Normal mood and affect.   Laboratory Data: BMET    Component Value Date/Time   NA 138 03/29/2023 0620   K 4.1 03/29/2023 0620   CL 99  03/29/2023 0620   CO2 24 03/29/2023 0620   GLUCOSE 124 (H) 03/29/2023 0620   BUN 82 (H) 03/29/2023 0620   CREATININE 13.30 (H) 03/29/2023 0620   CALCIUM 9.5 03/29/2023 0620   GFRNONAA 4 (L) 03/29/2023 0620    CBC    Component Value Date/Time   WBC 5.7 03/29/2023 0620   RBC 3.65 (L) 03/29/2023 0620   HGB 10.9 (L) 03/29/2023 0620   HCT 34.0 (L) 03/29/2023 0620   PLT 151 03/29/2023 0620   MCV 93.2 03/29/2023 0620   MCH 29.9 03/29/2023 0620   MCHC 32.1 03/29/2023 0620   RDW 18.3 (H) 03/29/2023 0620   LYMPHSABS 0.3 (L) 03/24/2023 0941   MONOABS 0.2 03/24/2023 0941   EOSABS 0.1 03/24/2023 0941   BASOSABS 0.0 03/24/2023 0941   Hemoglobin A1C Order: [id number] Component Ref Range & Units 4 mo ago  Hemoglobin A1C 4.2 - 5.6 % 6.8 High   Average Blood Glucose (Calc) mg/dL 401  Resulting Agency [facility]  Narrative Performed by [facility] Normal Range:    4.2 - 5.6% Increased Risk:  5.7 - 6.4% Diabetes:        >= 6.5% Glycemic Control for adults with diabetes:  <7%    Specimen Collected: 02/21/23 10:15   Performed by: [name] Last Resulted: 02/21/23 10:25  Received From: [facility]  Result Received: 02/21/23 14:21   I have reviewed the labs.   Pertinent Imaging: N/A   Assessment & Plan:    ED  -Responding to the 20 mg sildenafil -he is at goal  2. Testosterone deficiency - followed by endocrinology   3. Increased prolactin - followed by endocrinology  Return in about 1 year (around 06/26/2024) for SHIM.  Cloretta Ned   [facility] [address] [place], [place] [address] [phone]

## 2023-06-23 DIAGNOSIS — N186 End stage renal disease: Secondary | ICD-10-CM | POA: Diagnosis not present

## 2023-06-23 DIAGNOSIS — N2581 Secondary hyperparathyroidism of renal origin: Secondary | ICD-10-CM | POA: Diagnosis not present

## 2023-06-23 DIAGNOSIS — Z992 Dependence on renal dialysis: Secondary | ICD-10-CM | POA: Diagnosis not present

## 2023-06-24 ENCOUNTER — Encounter: Payer: Medicare HMO | Admitting: *Deleted

## 2023-06-24 DIAGNOSIS — E1022 Type 1 diabetes mellitus with diabetic chronic kidney disease: Secondary | ICD-10-CM | POA: Diagnosis not present

## 2023-06-24 DIAGNOSIS — N186 End stage renal disease: Secondary | ICD-10-CM | POA: Diagnosis not present

## 2023-06-24 DIAGNOSIS — E1065 Type 1 diabetes mellitus with hyperglycemia: Secondary | ICD-10-CM | POA: Diagnosis not present

## 2023-06-24 DIAGNOSIS — L97922 Non-pressure chronic ulcer of unspecified part of left lower leg with fat layer exposed: Secondary | ICD-10-CM | POA: Diagnosis not present

## 2023-06-24 DIAGNOSIS — I890 Lymphedema, not elsewhere classified: Secondary | ICD-10-CM | POA: Diagnosis not present

## 2023-06-24 DIAGNOSIS — I872 Venous insufficiency (chronic) (peripheral): Secondary | ICD-10-CM | POA: Diagnosis not present

## 2023-06-24 DIAGNOSIS — I120 Hypertensive chronic kidney disease with stage 5 chronic kidney disease or end stage renal disease: Secondary | ICD-10-CM | POA: Diagnosis not present

## 2023-06-24 DIAGNOSIS — I499 Cardiac arrhythmia, unspecified: Secondary | ICD-10-CM | POA: Diagnosis not present

## 2023-06-24 DIAGNOSIS — E1051 Type 1 diabetes mellitus with diabetic peripheral angiopathy without gangrene: Secondary | ICD-10-CM | POA: Diagnosis not present

## 2023-06-24 NOTE — Progress Notes (Signed)
Birth/Sex: Treating RN: June 07, 1971 (52 y.o. [name] [name]) [name] [name] Primary Care Provider: Maudie [name] Other Clinician: Referring Provider: Treating Provider/Extender: [name] BSO [name], [name] [name] in Treatment: 13 Verbal / Phone Orders: No Diagnosis Coding ICD-10 Coding Code Description [phone] Non-pressure chronic ulcer of other part of left lower leg with fat layer exposed L97.522 Non-pressure chronic ulcer of other part of left foot with fat layer exposed I87.312 Chronic venous hypertension (idiopathic) with ulcer of left lower extremity I87.321 Chronic venous hypertension (idiopathic) with inflammation of right lower extremity E11.622 Type 2 diabetes mellitus with other skin ulcer I89.0 Lymphedema, not elsewhere classified Follow-up Appointments Return Appointment in 1 week. [name] [name], [name] [name] ([id number]) [id number] Page 5 of 10 Home Health Home Health Company: - [facility] for wound care. May utilize formulary equivalent dressing for wound treatment orders unless otherwise specified. Home Health Nurse may visit PRN to address patients wound care needs. [name] [name] [phone] Change on Monday and Friday. Wound Care Center will change on Wednesday Scheduled days for dressing changes to be completed; exception, patient has scheduled wound care visit that day. **Please direct any NON-WOUND related issues/requests for orders to patient's Primary Care Physician. **If current dressing  causes regression in wound condition, may D/C ordered dressing product/s and apply Normal Saline Moist Dressing daily until next Wound Healing Center or Other MD appointment. **Notify Wound Healing Center of regression in wound condition at [phone]. Bathing/ Shower/ Hygiene May shower with wound dressing protected with water repellent cover or cast protector. No tub bath. Anesthetic (Use 'Patient Medications' Section for Anesthetic Order Entry) Lidocaine applied to wound bed Edema Control - Lymphedema / Segmental Compressive Device / Other Tubigrip double layer applied - right leg Elevate, Exercise Daily and [name] void Standing for Long Periods of Time. Elevate legs to the level of the heart and pump ankles as often as possible Elevate leg(s) parallel to the floor when sitting. Wound Treatment Wound #22 - Lower Leg Wound Laterality: Left Cleanser: Soap and Water 3 x Per Week/30 Days Discharge Instructions: Gently cleanse wound with antibacterial soap, rinse and pat dry prior to dressing wounds Cleanser: Wound Cleanser 3 x Per Week/30 Days Discharge Instructions: Wash your hands with soap and water. Remove old dressing, discard into plastic bag and place into trash. Cleanse the wound with Wound Cleanser prior to applying [name] clean dressing using gauze sponges, not tissues or cotton balls. Do not scrub or use excessive force. Pat dry using gauze sponges, not tissue or cotton balls. Prim Dressing: Silvercel 4 1/4x 4 1/4 (in/in) 3 x Per Week/30 Days ary Discharge Instructions: Apply Silvercel 4 1/4x 4 1/4 (in/in) as instructed Secondary Dressing: Zetuvit Plus 4x4 (in/in) 3 x Per Week/30 Days Secured With: D[employer] Food Dressing, Latex-free, Size 5, Small-Head / Shoulder / Thigh 3 x Per Week/30 Days Discharge Instructions: over wrap for comfort Compression Wrap: Urgo K2 Lite, two layer compression system, large 3 x Per Week/30 Days Electronic Signature(s) Signed: 05/18/2023 5:02:29 PM By: [name] Najjar MD Signed: 05/19/2023 1:18:15 PM By: [name] Pax RN Entered By: [name] [name] on 05/18/2023 08:14:23 -------------------------------------------------------------------------------- Problem List Details Patient Name: Date of Service: [name] [name] 05/18/2023 10:15 [name] [name] Medical Record Number: [id number] Patient Account Number: [id number] Date of Birth/Sex: Treating RN: 1971/08/10 (52 y.o. [name] [name] Primary Care Provider: Maudie [name] Other Clinician: Referring Provider: Treating Provider/Extender: [name], [name] [name] in Treatment: 13 Active Problems ICD-10 Encounter Code Description  [name] [name], [name] [name] ([id number]) [id number] Page 1 of 10 Visit Report for 05/18/2023 HPI Details Patient Name: Date of Service: [name] [name] 05/18/2023 10:15 [name] [name] Medical Record Number: [id number] Patient Account Number: [id number] Date of Birth/Sex: Treating RN: [birth year] (52 y.o. [name] [name]) [name] [name] Primary Care Provider: Maudie [name] Other Clinician: Referring Provider: Treating Provider/Extender: [name], [name] [name] in Treatment: 13 History of Present Illness HPI Description: 12/28/17; this is [name] now 52 year old man who is [name] type II diabetic. He was hospitalized from 10/01/17 through 10/19/17. He had an MSSA soft tissue and skin infection. 2 open areas on the left leg were identified he has [name] smaller area on the left medial calf superiorly just below the knee and [name] wound just above the left ankle on the posterior medial aspect. I think both of these were surgical IandD sites when he was in the hospital. He was discharged with [name] wound VAC at that point however this is since been taken off. He follows with Dr. Sampson Goon for the [facility] and he is still on chronic Keflex at 500 twice [name] day. At that time he was hospitalized his hemoglobin A1c was 15.1 however if I'[name] reading his endocrinologist notes correctly that is improved. He has been following with Dr. Lorretta Harp at [facility] vascular and he has been applying calcium alginate and Unna boots. He has home health changing the dressing. They have also been attempting to get him external compression pumps although the patient is unaware whether they've been approved by insurance at this point. as mentioned he has [name] smaller clean wound on the right lateral calf just below the knee and he has [name] much larger area just above the left ankle medially and posteriorly. Our intake nurse reported greenish purulent looking drainage.the patient did have surgical material sent to pathology in February. This showed chronic  abscess The patient also has lymphedema stage III in the left greater than right lower extremities. He has [name] history of blisters with wounds but these of all were always healed. The patient thinks that the lymphedema may have been present since he was about 52 years old i.e. about 30 years. He does not have graded pressure stockings and has not worn stockings. He does not have [name] distant history of DVT PE or phlebitis. He has not been systemically unwell fever no chills. He states that his Lasix is recently been reduced. He tells me his kidney function is at "30%" and he has been followed by Dr. Thedore Mins of nephrology. At one point he was on Lasix 80 twice [name] day however that's been cut back and he is now on Lasix at 20 twice [name] day. The patient has [name] history of PAD listed in his records although he comes from Dr. Lorretta Harp I don't think is felt to have significant PAD. ABIs in our clinic were noncompressible bilaterally. 01/04/18; patient has [name] large wound on the left lateral lower calf and [name] small wound on the left medial upper calf. He has been to see his nephrologist who changed him to Demadex 40 mg [name] day. I'[name] hopeful this will help with his systemic fluid overload. He also has stage III lymphedema. Really no change in the 2 wounds since last week 01/11/18; the patient is down 13 pounds. He put his stage III lymphedema left leg in 4K compression last week and there is less edema fluid however we still haven't been able to communicate with home health but  No mechanical debridement was necessary. No evidence of surrounding infection.Edema control is marginal Integumentary (Hair, Skin) Wound #22 status is Open. Original cause of wound was Gradually Appeared. The date acquired was: 08/30/2022. The wound has been in treatment 13 weeks. The wound is located on the Left Lower Leg. The wound measures 2cm length x 2.5cm width x 0.1cm depth; 3.927cm^2 area and 0.393cm^3 volume. There is Fat Layer (Subcutaneous Tissue) exposed. There is no tunneling or undermining noted. There is [name] medium amount of serosanguineous drainage noted. There is large (67-100%) red granulation within the wound bed. There is [name] small (1-33%) amount of necrotic tissue within the wound bed including Adherent Slough. Assessment Active Problems ICD-10 Non-pressure chronic ulcer of other part of left lower leg with fat layer exposed Non-pressure chronic ulcer of other part of left foot  with fat layer exposed Chronic venous hypertension (idiopathic) with ulcer of left lower extremity Chronic venous hypertension (idiopathic) with inflammation of right lower extremity Type 2 diabetes mellitus with other skin ulcer Lymphedema, not elsewhere classified Plan 1. No need at this point to change the primary dressing which is Hydrofera Blue under and Urgo K2 lite 2. We certainly could increase the compression if necessary based on clinical exam at the bedside 3. The patient has compression pumps at home I have asked him to use these at least once [name] day even over our existing compression Electronic Signature(s) Signed: 05/18/2023 5:02:29 PM By: [name] Najjar MD Entered By: [name] [name] on 05/18/2023 07:45:06 [name] [name], [name] [name] ([id number]) [id number] Page 10 of 10 -------------------------------------------------------------------------------- SuperBill Details Patient Name: Date of Service: [name] [name] 05/18/2023 Medical Record Number: [id number] Patient Account Number: [id number] Date of Birth/Sex: Treating RN: [birth year] (52 y.o. [name] [name]) [name] [name] Primary Care Provider: Maudie [name] Other Clinician: Referring Provider: Treating Provider/Extender: [name], [name] [name] in Treatment: 13 Diagnosis Coding ICD-10 Codes Code Description [phone] Non-pressure chronic ulcer of other part of left lower leg with fat layer exposed L97.522 Non-pressure chronic ulcer of other part of left foot with fat layer exposed I87.312 Chronic venous hypertension (idiopathic) with ulcer of left lower extremity I87.321 Chronic venous hypertension (idiopathic) with inflammation of right lower extremity E11.622 Type 2 diabetes mellitus with other skin ulcer I89.0 Lymphedema, not elsewhere classified Facility Procedures : CPT4 Code: [id number] Description: (Facility Use Only) 29581LT - APPLY MULTLAY COMPRS LWR LT LEG Modifier: Quantity:  1 Physician Procedures : CPT4 Code Description Modifier [id number] - WC PHYS LEVEL 3 - EST PT ICD-10 Diagnosis Description L97.822 Non-pressure chronic ulcer of other part of left lower leg with fat layer exposed I89.0 Lymphedema, not elsewhere classified I87.312 Chronic  venous hypertension (idiopathic) with ulcer of left lower extremity Quantity: 1 Electronic Signature(s) Signed: 05/18/2023 5:02:29 PM By: [name] Najjar MD Signed: 05/19/2023 1:18:15 PM By: [name] Pax RN Entered By: [name] [name] on 05/18/2023 08:15:05  Birth/Sex: Treating RN: June 07, 1971 (52 y.o. [name] [name]) [name] [name] Primary Care Provider: Maudie [name] Other Clinician: Referring Provider: Treating Provider/Extender: [name] BSO [name], [name] [name] in Treatment: 13 Verbal / Phone Orders: No Diagnosis Coding ICD-10 Coding Code Description [phone] Non-pressure chronic ulcer of other part of left lower leg with fat layer exposed L97.522 Non-pressure chronic ulcer of other part of left foot with fat layer exposed I87.312 Chronic venous hypertension (idiopathic) with ulcer of left lower extremity I87.321 Chronic venous hypertension (idiopathic) with inflammation of right lower extremity E11.622 Type 2 diabetes mellitus with other skin ulcer I89.0 Lymphedema, not elsewhere classified Follow-up Appointments Return Appointment in 1 week. [name] [name], [name] [name] ([id number]) [id number] Page 5 of 10 Home Health Home Health Company: - [facility] for wound care. May utilize formulary equivalent dressing for wound treatment orders unless otherwise specified. Home Health Nurse may visit PRN to address patients wound care needs. [name] [name] [phone] Change on Monday and Friday. Wound Care Center will change on Wednesday Scheduled days for dressing changes to be completed; exception, patient has scheduled wound care visit that day. **Please direct any NON-WOUND related issues/requests for orders to patient's Primary Care Physician. **If current dressing  causes regression in wound condition, may D/C ordered dressing product/s and apply Normal Saline Moist Dressing daily until next Wound Healing Center or Other MD appointment. **Notify Wound Healing Center of regression in wound condition at [phone]. Bathing/ Shower/ Hygiene May shower with wound dressing protected with water repellent cover or cast protector. No tub bath. Anesthetic (Use 'Patient Medications' Section for Anesthetic Order Entry) Lidocaine applied to wound bed Edema Control - Lymphedema / Segmental Compressive Device / Other Tubigrip double layer applied - right leg Elevate, Exercise Daily and [name] void Standing for Long Periods of Time. Elevate legs to the level of the heart and pump ankles as often as possible Elevate leg(s) parallel to the floor when sitting. Wound Treatment Wound #22 - Lower Leg Wound Laterality: Left Cleanser: Soap and Water 3 x Per Week/30 Days Discharge Instructions: Gently cleanse wound with antibacterial soap, rinse and pat dry prior to dressing wounds Cleanser: Wound Cleanser 3 x Per Week/30 Days Discharge Instructions: Wash your hands with soap and water. Remove old dressing, discard into plastic bag and place into trash. Cleanse the wound with Wound Cleanser prior to applying [name] clean dressing using gauze sponges, not tissues or cotton balls. Do not scrub or use excessive force. Pat dry using gauze sponges, not tissue or cotton balls. Prim Dressing: Silvercel 4 1/4x 4 1/4 (in/in) 3 x Per Week/30 Days ary Discharge Instructions: Apply Silvercel 4 1/4x 4 1/4 (in/in) as instructed Secondary Dressing: Zetuvit Plus 4x4 (in/in) 3 x Per Week/30 Days Secured With: [employer] Food Dressing, Latex-free, Size 5, Small-Head / Shoulder / Thigh 3 x Per Week/30 Days Discharge Instructions: over wrap for comfort Compression Wrap: Urgo K2 Lite, two layer compression system, large 3 x Per Week/30 Days Electronic Signature(s) Signed: 05/18/2023 5:02:29 PM By: [name] Najjar MD Signed: 05/19/2023 1:18:15 PM By: [name] Pax RN Entered By: [name] [name] on 05/18/2023 08:14:23 -------------------------------------------------------------------------------- Problem List Details Patient Name: Date of Service: [name] [name] 05/18/2023 10:15 [name] [name] Medical Record Number: [id number] Patient Account Number: [id number] Date of Birth/Sex: Treating RN: 1971/08/10 (52 y.o. [name] [name] Primary Care Provider: Maudie [name] Other Clinician: Referring Provider: Treating Provider/Extender: [name], [name] [name] in Treatment: 13 Active Problems ICD-10 Encounter Code Description  [name] [name], [name] [name] ([id number]) [id number] Page 1 of 10 Visit Report for 05/18/2023 HPI Details Patient Name: Date of Service: [name] [name] 05/18/2023 10:15 [name] [name] Medical Record Number: [id number] Patient Account Number: [id number] Date of Birth/Sex: Treating RN: [birth year] (52 y.o. [name] [name]) [name] [name] Primary Care Provider: Maudie [name] Other Clinician: Referring Provider: Treating Provider/Extender: [name], [name] [name] in Treatment: 13 History of Present Illness HPI Description: 12/28/17; this is [name] now 52 year old man who is [name] type II diabetic. He was hospitalized from 10/01/17 through 10/19/17. He had an MSSA soft tissue and skin infection. 2 open areas on the left leg were identified he has [name] smaller area on the left medial calf superiorly just below the knee and [name] wound just above the left ankle on the posterior medial aspect. I think both of these were surgical IandD sites when he was in the hospital. He was discharged with [name] wound VAC at that point however this is since been taken off. He follows with Dr. Sampson Goon for the [facility] and he is still on chronic Keflex at 500 twice [name] day. At that time he was hospitalized his hemoglobin A1c was 15.1 however if I'[name] reading his endocrinologist notes correctly that is improved. He has been following with Dr. Lorretta Harp at vein [facility] vascular and he has been applying calcium alginate and Unna boots. He has home health changing the dressing. They have also been attempting to get him external compression pumps although the patient is unaware whether they've been approved by insurance at this point. as mentioned he has [name] smaller clean wound on the right lateral calf just below the knee and he has [name] much larger area just above the left ankle medially and posteriorly. Our intake nurse reported greenish purulent looking drainage.the patient did have surgical material sent to pathology in February. This showed chronic  abscess The patient also has lymphedema stage III in the left greater than right lower extremities. He has [name] history of blisters with wounds but these of all were always healed. The patient thinks that the lymphedema may have been present since he was about 52 years old i.e. about 30 years. He does not have graded pressure stockings and has not worn stockings. He does not have [name] distant history of DVT PE or phlebitis. He has not been systemically unwell fever no chills. He states that his Lasix is recently been reduced. He tells me his kidney function is at "30%" and he has been followed by Dr. Thedore Mins of nephrology. At one point he was on Lasix 80 twice [name] day however that's been cut back and he is now on Lasix at 20 twice [name] day. The patient has [name] history of PAD listed in his records although he comes from Dr. Lorretta Harp I don't think is felt to have significant PAD. ABIs in our clinic were noncompressible bilaterally. 01/04/18; patient has [name] large wound on the left lateral lower calf and [name] small wound on the left medial upper calf. He has been to see his nephrologist who changed him to Demadex 40 mg [name] day. I'[name] hopeful this will help with his systemic fluid overload. He also has stage III lymphedema. Really no change in the 2 wounds since last week 01/11/18; the patient is down 13 pounds. He put his stage III lymphedema left leg in 4K compression last week and there is less edema fluid however we still haven't been able to communicate with home health but  [name] [name], [name] [name] ([id number]) [id number] Page 1 of 10 Visit Report for 05/18/2023 HPI Details Patient Name: Date of Service: [name] [name] 05/18/2023 10:15 [name] [name] Medical Record Number: [id number] Patient Account Number: [id number] Date of Birth/Sex: Treating RN: [birth year] (52 y.o. [name] [name]) [name] [name] Primary Care Provider: Maudie [name] Other Clinician: Referring Provider: Treating Provider/Extender: [name], [name] [name] in Treatment: 13 History of Present Illness HPI Description: 12/28/17; this is [name] now 52 year old man who is [name] type II diabetic. He was hospitalized from 10/01/17 through 10/19/17. He had an MSSA soft tissue and skin infection. 2 open areas on the left leg were identified he has [name] smaller area on the left medial calf superiorly just below the knee and [name] wound just above the left ankle on the posterior medial aspect. I think both of these were surgical IandD sites when he was in the hospital. He was discharged with [name] wound VAC at that point however this is since been taken off. He follows with Dr. Sampson Goon for the [facility] and he is still on chronic Keflex at 500 twice [name] day. At that time he was hospitalized his hemoglobin A1c was 15.1 however if I'[name] reading his endocrinologist notes correctly that is improved. He has been following with Dr. Lorretta Harp at vein and vascular and he has been applying calcium alginate and Unna boots. He has home health changing the dressing. They have also been attempting to get him external compression pumps although the patient is unaware whether they've been approved by insurance at this point. as mentioned he has [name] smaller clean wound on the right lateral calf just below the knee and he has [name] much larger area just above the left ankle medially and posteriorly. Our intake nurse reported greenish purulent looking drainage.the patient did have surgical material sent to pathology in February. This showed chronic  abscess The patient also has lymphedema stage III in the left greater than right lower extremities. He has [name] history of blisters with wounds but these of all were always healed. The patient thinks that the lymphedema may have been present since he was about 52 years old i.e. about 30 years. He does not have graded pressure stockings and has not worn stockings. He does not have [name] distant history of DVT PE or phlebitis. He has not been systemically unwell fever no chills. He states that his Lasix is recently been reduced. He tells me his kidney function is at "30%" and he has been followed by Dr. Thedore Mins of nephrology. At one point he was on Lasix 80 twice [name] day however that's been cut back and he is now on Lasix at 20 twice [name] day. The patient has [name] history of PAD listed in his records although he comes from Dr. Lorretta Harp I don't think is felt to have significant PAD. ABIs in our clinic were noncompressible bilaterally. 01/04/18; patient has [name] large wound on the left lateral lower calf and [name] small wound on the left medial upper calf. He has been to see his nephrologist who changed him to Demadex 40 mg [name] day. I'[name] hopeful this will help with his systemic fluid overload. He also has stage III lymphedema. Really no change in the 2 wounds since last week 01/11/18; the patient is down 13 pounds. He put his stage III lymphedema left leg in 4K compression last week and there is less edema fluid however we still haven't been able to communicate with home health but  Birth/Sex: Treating RN: June 07, 1971 (52 y.o. [name] [name]) [name] [name] Primary Care Provider: Maudie [name] Other Clinician: Referring Provider: Treating Provider/Extender: [name] BSO [name], [name] [name] in Treatment: 13 Verbal / Phone Orders: No Diagnosis Coding ICD-10 Coding Code Description [phone] Non-pressure chronic ulcer of other part of left lower leg with fat layer exposed L97.522 Non-pressure chronic ulcer of other part of left foot with fat layer exposed I87.312 Chronic venous hypertension (idiopathic) with ulcer of left lower extremity I87.321 Chronic venous hypertension (idiopathic) with inflammation of right lower extremity E11.622 Type 2 diabetes mellitus with other skin ulcer I89.0 Lymphedema, not elsewhere classified Follow-up Appointments Return Appointment in 1 week. [name] [name], [name] [name] ([id number]) [id number] Page 5 of 10 Home Health Home Health Company: - [facility] for wound care. May utilize formulary equivalent dressing for wound treatment orders unless otherwise specified. Home Health Nurse may visit PRN to address patients wound care needs. [name] [name] [phone] Change on Monday and Friday. Wound Care Center will change on Wednesday Scheduled days for dressing changes to be completed; exception, patient has scheduled wound care visit that day. **Please direct any NON-WOUND related issues/requests for orders to patient's Primary Care Physician. **If current dressing  causes regression in wound condition, may D/C ordered dressing product/s and apply Normal Saline Moist Dressing daily until next Wound Healing Center or Other MD appointment. **Notify Wound Healing Center of regression in wound condition at [phone]. Bathing/ Shower/ Hygiene May shower with wound dressing protected with water repellent cover or cast protector. No tub bath. Anesthetic (Use 'Patient Medications' Section for Anesthetic Order Entry) Lidocaine applied to wound bed Edema Control - Lymphedema / Segmental Compressive Device / Other Tubigrip double layer applied - right leg Elevate, Exercise Daily and [name] void Standing for Long Periods of Time. Elevate legs to the level of the heart and pump ankles as often as possible Elevate leg(s) parallel to the floor when sitting. Wound Treatment Wound #22 - Lower Leg Wound Laterality: Left Cleanser: Soap and Water 3 x Per Week/30 Days Discharge Instructions: Gently cleanse wound with antibacterial soap, rinse and pat dry prior to dressing wounds Cleanser: Wound Cleanser 3 x Per Week/30 Days Discharge Instructions: Wash your hands with soap and water. Remove old dressing, discard into plastic bag and place into trash. Cleanse the wound with Wound Cleanser prior to applying [name] clean dressing using gauze sponges, not tissues or cotton balls. Do not scrub or use excessive force. Pat dry using gauze sponges, not tissue or cotton balls. Prim Dressing: Silvercel 4 1/4x 4 1/4 (in/in) 3 x Per Week/30 Days ary Discharge Instructions: Apply Silvercel 4 1/4x 4 1/4 (in/in) as instructed Secondary Dressing: Zetuvit Plus 4x4 (in/in) 3 x Per Week/30 Days Secured With: Dole Food Dressing, Latex-free, Size 5, Small-Head / Shoulder / Thigh 3 x Per Week/30 Days Discharge Instructions: over wrap for comfort Compression Wrap: Urgo K2 Lite, two layer compression system, large 3 x Per Week/30 Days Electronic Signature(s) Signed: 05/18/2023 5:02:29 PM By: [name] Najjar MD Signed: 05/19/2023 1:18:15 PM By: [name] Pax RN Entered By: [name] [name] on 05/18/2023 08:14:23 -------------------------------------------------------------------------------- Problem List Details Patient Name: Date of Service: [name] [name] 05/18/2023 10:15 [name] [name] Medical Record Number: [id number] Patient Account Number: [id number] Date of Birth/Sex: Treating RN: 1971/08/10 (52 y.o. [name] [name] Primary Care Provider: Maudie [name] Other Clinician: Referring Provider: Treating Provider/Extender: [name], [name] [name] in Treatment: 13 Active Problems ICD-10 Encounter Code Description  Active Date MDM [name] [name], [name] [name] [id number] Page 6 of 10 Code Description Active Date MDM Diagnosis L97.822 Non-pressure chronic ulcer of other part of left lower leg with fat layer exposed6/19/2024 No Yes L97.522 Non-pressure chronic ulcer of other part of left foot with fat layer exposed 03/16/2023 No Yes I87.312 Chronic venous hypertension (idiopathic) with ulcer of left lower extremity 02/16/2023 No Yes I87.321 Chronic venous hypertension (idiopathic) with inflammation of right lower 02/16/2023 No Yes extremity E11.622 Type 2 diabetes mellitus with other skin ulcer 02/16/2023 No Yes I89.0 Lymphedema, not elsewhere classified 03/23/2023 No Yes Inactive Problems Resolved Problems Electronic Signature(s) Signed: 05/18/2023 5:02:29 PM By: [name] Najjar MD Entered By: [name] [name] on 05/18/2023 07:41:00 -------------------------------------------------------------------------------- Progress Note Details Patient Name: Date of Service: [name] [name] 05/18/2023 10:15 [name] [name] Medical Record Number: [id number] Patient Account Number: [id number] Date of Birth/Sex: Treating RN: [birth year] (52 y.o. [name] [name] Primary Care Provider: Maudie [name] Other Clinician: Referring Provider: Treating Provider/Extender: [name],  [name] [name] in Treatment: 13 Subjective History of Present Illness (HPI) 12/28/17; this is [name] now 52 year old man who is [name] type II diabetic. He was hospitalized from 10/01/17 through 10/19/17. He had an MSSA soft tissue and skin infection. 2 open areas on the left leg were identified he has [name] smaller area on the left medial calf superiorly just below the knee and [name] wound just above the left ankle on the posterior medial aspect. I think both of these were surgical IandD sites when he was in the hospital. He was discharged with [name] wound VAC at that point however this is since been taken off. He follows with Dr. Sampson Goon for the [facility] and he is still on chronic Keflex at 500 twice [name] day. At that time he was hospitalized his hemoglobin A1c was 15.1 however if I'[name] reading his endocrinologist notes correctly that is improved. He has been following with Dr. Lorretta Harp at [facility] and he has been applying calcium alginate and Unna boots. He has home health changing the dressing. They have also been attempting to get him external compression pumps although the patient is unaware whether they've been approved by insurance at this point. as mentioned he has [name] smaller clean wound on the right lateral calf just below the knee and he has [name] much larger area just above the left ankle medially and posteriorly. Our intake nurse reported greenish purulent looking drainage.the patient did have surgical material sent to pathology in February. This showed chronic abscess The patient also has lymphedema stage III in the left greater than right lower extremities. He has [name] history of blisters with wounds but these of all were always healed. The patient thinks that the lymphedema may have been present since he was about 53 years old i.e. about 30 years. He does not have graded pressure stockings and has not worn stockings. He does not have [name] distant history of DVT PE or phlebitis. He has not been  systemically unwell fever no chills. He states that his Lasix is recently been reduced. He tells me his kidney function is at "30%" and he has been followed by Dr. Thedore Mins of nephrology. At one point he was on Lasix 80 twice [name] day however that's been cut back and he is now on Lasix at 20 twice [name] day. [name] [name], [name] [name] ([id number]) [id number] Page 7 of 10 The patient has [name] history of PAD listed in his records although he comes from  No mechanical debridement was necessary. No evidence of surrounding infection.Edema control is marginal Integumentary (Hair, Skin) Wound #22 status is Open. Original cause of wound was Gradually Appeared. The date acquired was: 08/30/2022. The wound has been in treatment 13 weeks. The wound is located on the Left Lower Leg. The wound measures 2cm length x 2.5cm width x 0.1cm depth; 3.927cm^2 area and 0.393cm^3 volume. There is Fat Layer (Subcutaneous Tissue) exposed. There is no tunneling or undermining noted. There is [name] medium amount of serosanguineous drainage noted. There is large (67-100%) red granulation within the wound bed. There is [name] small (1-33%) amount of necrotic tissue within the wound bed including Adherent Slough. Assessment Active Problems ICD-10 Non-pressure chronic ulcer of other part of left lower leg with fat layer exposed Non-pressure chronic ulcer of other part of left foot  with fat layer exposed Chronic venous hypertension (idiopathic) with ulcer of left lower extremity Chronic venous hypertension (idiopathic) with inflammation of right lower extremity Type 2 diabetes mellitus with other skin ulcer Lymphedema, not elsewhere classified Plan 1. No need at this point to change the primary dressing which is Hydrofera Blue under and Urgo K2 lite 2. We certainly could increase the compression if necessary based on clinical exam at the bedside 3. The patient has compression pumps at home I have asked him to use these at least once [name] day even over our existing compression Electronic Signature(s) Signed: 05/18/2023 5:02:29 PM By: [name] Najjar MD Entered By: [name] [name] on 05/18/2023 07:45:06 [name] [name], [name] [name] ([id number]) [id number] Page 10 of 10 -------------------------------------------------------------------------------- SuperBill Details Patient Name: Date of Service: [name] [name] 05/18/2023 Medical Record Number: [id number] Patient Account Number: [id number] Date of Birth/Sex: Treating RN: [birth year] (52 y.o. [name] [name]) [name] [name] Primary Care Provider: Maudie [name] Other Clinician: Referring Provider: Treating Provider/Extender: [name], [name] [name] in Treatment: 13 Diagnosis Coding ICD-10 Codes Code Description [phone] Non-pressure chronic ulcer of other part of left lower leg with fat layer exposed L97.522 Non-pressure chronic ulcer of other part of left foot with fat layer exposed I87.312 Chronic venous hypertension (idiopathic) with ulcer of left lower extremity I87.321 Chronic venous hypertension (idiopathic) with inflammation of right lower extremity E11.622 Type 2 diabetes mellitus with other skin ulcer I89.0 Lymphedema, not elsewhere classified Facility Procedures : CPT4 Code: 21308657 Description: (Facility Use Only) 29581LT - APPLY MULTLAY COMPRS LWR LT LEG Modifier: Quantity:  1 Physician Procedures : CPT4 Code Description Modifier [id number] - WC PHYS LEVEL 3 - EST PT ICD-10 Diagnosis Description L97.822 Non-pressure chronic ulcer of other part of left lower leg with fat layer exposed I89.0 Lymphedema, not elsewhere classified I87.312 Chronic  venous hypertension (idiopathic) with ulcer of left lower extremity Quantity: 1 Electronic Signature(s) Signed: 05/18/2023 5:02:29 PM By: [name] Najjar MD Signed: 05/19/2023 1:18:15 PM By: [name] Pax RN Entered By: [name] [name] on 05/18/2023 08:15:05  Active Date MDM [name] [name], [name] [name] [id number] Page 6 of 10 Code Description Active Date MDM Diagnosis L97.822 Non-pressure chronic ulcer of other part of left lower leg with fat layer exposed6/19/2024 No Yes L97.522 Non-pressure chronic ulcer of other part of left foot with fat layer exposed 03/16/2023 No Yes I87.312 Chronic venous hypertension (idiopathic) with ulcer of left lower extremity 02/16/2023 No Yes I87.321 Chronic venous hypertension (idiopathic) with inflammation of right lower 02/16/2023 No Yes extremity E11.622 Type 2 diabetes mellitus with other skin ulcer 02/16/2023 No Yes I89.0 Lymphedema, not elsewhere classified 03/23/2023 No Yes Inactive Problems Resolved Problems Electronic Signature(s) Signed: 05/18/2023 5:02:29 PM By: [name] Najjar MD Entered By: [name] [name] on 05/18/2023 07:41:00 -------------------------------------------------------------------------------- Progress Note Details Patient Name: Date of Service: [name] [name] 05/18/2023 10:15 [name] [name] Medical Record Number: [id number] Patient Account Number: [id number] Date of Birth/Sex: Treating RN: [birth year] (52 y.o. [name] [name] Primary Care Provider: Maudie [name] Other Clinician: Referring Provider: Treating Provider/Extender: [name],  [name] [name] in Treatment: 13 Subjective History of Present Illness (HPI) 12/28/17; this is [name] now 52 year old man who is [name] type II diabetic. He was hospitalized from 10/01/17 through 10/19/17. He had an MSSA soft tissue and skin infection. 2 open areas on the left leg were identified he has [name] smaller area on the left medial calf superiorly just below the knee and [name] wound just above the left ankle on the posterior medial aspect. I think both of these were surgical IandD sites when he was in the hospital. He was discharged with [name] wound VAC at that point however this is since been taken off. He follows with Dr. Sampson Goon for the [facility] and he is still on chronic Keflex at 500 twice [name] day. At that time he was hospitalized his hemoglobin A1c was 15.1 however if I'[name] reading his endocrinologist notes correctly that is improved. He has been following with Dr. Lorretta Harp at [facility] and he has been applying calcium alginate and Unna boots. He has home health changing the dressing. They have also been attempting to get him external compression pumps although the patient is unaware whether they've been approved by insurance at this point. as mentioned he has [name] smaller clean wound on the right lateral calf just below the knee and he has [name] much larger area just above the left ankle medially and posteriorly. Our intake nurse reported greenish purulent looking drainage.the patient did have surgical material sent to pathology in February. This showed chronic abscess The patient also has lymphedema stage III in the left greater than right lower extremities. He has [name] history of blisters with wounds but these of all were always healed. The patient thinks that the lymphedema may have been present since he was about 53 years old i.e. about 30 years. He does not have graded pressure stockings and has not worn stockings. He does not have [name] distant history of DVT PE or phlebitis. He has not been  systemically unwell fever no chills. He states that his Lasix is recently been reduced. He tells me his kidney function is at "30%" and he has been followed by Dr. Thedore Mins of nephrology. At one point he was on Lasix 80 twice [name] day however that's been cut back and he is now on Lasix at 20 twice [name] day. [name] [name], [name] [name] ([id number]) [id number] Page 7 of 10 The patient has [name] history of PAD listed in his records although he comes from  Birth/Sex: Treating RN: June 07, 1971 (52 y.o. [name] [name]) [name] [name] Primary Care Provider: Maudie [name] Other Clinician: Referring Provider: Treating Provider/Extender: [name] BSO [name], [name] [name] in Treatment: 13 Verbal / Phone Orders: No Diagnosis Coding ICD-10 Coding Code Description [phone] Non-pressure chronic ulcer of other part of left lower leg with fat layer exposed L97.522 Non-pressure chronic ulcer of other part of left foot with fat layer exposed I87.312 Chronic venous hypertension (idiopathic) with ulcer of left lower extremity I87.321 Chronic venous hypertension (idiopathic) with inflammation of right lower extremity E11.622 Type 2 diabetes mellitus with other skin ulcer I89.0 Lymphedema, not elsewhere classified Follow-up Appointments Return Appointment in 1 week. [name] [name], [name] [name] ([id number]) [id number] Page 5 of 10 Home Health Home Health Company: - [facility] for wound care. May utilize formulary equivalent dressing for wound treatment orders unless otherwise specified. Home Health Nurse may visit PRN to address patients wound care needs. [name] [name] [phone] Change on Monday and Friday. Wound Care Center will change on Wednesday Scheduled days for dressing changes to be completed; exception, patient has scheduled wound care visit that day. **Please direct any NON-WOUND related issues/requests for orders to patient's Primary Care Physician. **If current dressing  causes regression in wound condition, may D/C ordered dressing product/s and apply Normal Saline Moist Dressing daily until next Wound Healing Center or Other MD appointment. **Notify Wound Healing Center of regression in wound condition at [phone]. Bathing/ Shower/ Hygiene May shower with wound dressing protected with water repellent cover or cast protector. No tub bath. Anesthetic (Use 'Patient Medications' Section for Anesthetic Order Entry) Lidocaine applied to wound bed Edema Control - Lymphedema / Segmental Compressive Device / Other Tubigrip double layer applied - right leg Elevate, Exercise Daily and [name] void Standing for Long Periods of Time. Elevate legs to the level of the heart and pump ankles as often as possible Elevate leg(s) parallel to the floor when sitting. Wound Treatment Wound #22 - Lower Leg Wound Laterality: Left Cleanser: Soap and Water 3 x Per Week/30 Days Discharge Instructions: Gently cleanse wound with antibacterial soap, rinse and pat dry prior to dressing wounds Cleanser: Wound Cleanser 3 x Per Week/30 Days Discharge Instructions: Wash your hands with soap and water. Remove old dressing, discard into plastic bag and place into trash. Cleanse the wound with Wound Cleanser prior to applying [name] clean dressing using gauze sponges, not tissues or cotton balls. Do not scrub or use excessive force. Pat dry using gauze sponges, not tissue or cotton balls. Prim Dressing: Silvercel 4 1/4x 4 1/4 (in/in) 3 x Per Week/30 Days ary Discharge Instructions: Apply Silvercel 4 1/4x 4 1/4 (in/in) as instructed Secondary Dressing: Zetuvit Plus 4x4 (in/in) 3 x Per Week/30 Days Secured With: D[employer] Food Dressing, Latex-free, Size 5, Small-Head / Shoulder / Thigh 3 x Per Week/30 Days Discharge Instructions: over wrap for comfort Compression Wrap: Urgo K2 Lite, two layer compression system, large 3 x Per Week/30 Days Electronic Signature(s) Signed: 05/18/2023 5:02:29 PM By: [name] Najjar MD Signed: 05/19/2023 1:18:15 PM By: [name] Pax RN Entered By: [name] [name] on 05/18/2023 08:14:23 -------------------------------------------------------------------------------- Problem List Details Patient Name: Date of Service: [name] [name] 05/18/2023 10:15 [name] [name] Medical Record Number: [id number] Patient Account Number: [id number] Date of Birth/Sex: Treating RN: 1971/08/10 (52 y.o. [name] [name] Primary Care Provider: Maudie [name] Other Clinician: Referring Provider: Treating Provider/Extender: [name], [name] [name] in Treatment: 13 Active Problems ICD-10 Encounter Code Description  No mechanical debridement was necessary. No evidence of surrounding infection.Edema control is marginal Integumentary (Hair, Skin) Wound #22 status is Open. Original cause of wound was Gradually Appeared. The date acquired was: 08/30/2022. The wound has been in treatment 13 weeks. The wound is located on the Left Lower Leg. The wound measures 2cm length x 2.5cm width x 0.1cm depth; 3.927cm^2 area and 0.393cm^3 volume. There is Fat Layer (Subcutaneous Tissue) exposed. There is no tunneling or undermining noted. There is [name] medium amount of serosanguineous drainage noted. There is large (67-100%) red granulation within the wound bed. There is [name] small (1-33%) amount of necrotic tissue within the wound bed including Adherent Slough. Assessment Active Problems ICD-10 Non-pressure chronic ulcer of other part of left lower leg with fat layer exposed Non-pressure chronic ulcer of other part of left foot  with fat layer exposed Chronic venous hypertension (idiopathic) with ulcer of left lower extremity Chronic venous hypertension (idiopathic) with inflammation of right lower extremity Type 2 diabetes mellitus with other skin ulcer Lymphedema, not elsewhere classified Plan 1. No need at this point to change the primary dressing which is Hydrofera Blue under and Urgo K2 lite 2. We certainly could increase the compression if necessary based on clinical exam at the bedside 3. The patient has compression pumps at home I have asked him to use these at least once [name] day even over our existing compression Electronic Signature(s) Signed: 05/18/2023 5:02:29 PM By: [name] Najjar MD Entered By: [name] [name] on 05/18/2023 07:45:06 [name] [name], [name] [name] ([id number]) [id number] Page 10 of 10 -------------------------------------------------------------------------------- SuperBill Details Patient Name: Date of Service: [name] [name] 05/18/2023 Medical Record Number: [id number] Patient Account Number: [id number] Date of Birth/Sex: Treating RN: [birth year] (52 y.o. [name] [name]) [name] [name] Primary Care Provider: Maudie [name] Other Clinician: Referring Provider: Treating Provider/Extender: [name], [name] [name] in Treatment: 13 Diagnosis Coding ICD-10 Codes Code Description [phone] Non-pressure chronic ulcer of other part of left lower leg with fat layer exposed L97.522 Non-pressure chronic ulcer of other part of left foot with fat layer exposed I87.312 Chronic venous hypertension (idiopathic) with ulcer of left lower extremity I87.321 Chronic venous hypertension (idiopathic) with inflammation of right lower extremity E11.622 Type 2 diabetes mellitus with other skin ulcer I89.0 Lymphedema, not elsewhere classified Facility Procedures : CPT4 Code: 21308657 Description: (Facility Use Only) 29581LT - APPLY MULTLAY COMPRS LWR LT LEG Modifier: Quantity:  1 Physician Procedures : CPT4 Code Description Modifier [id number] - WC PHYS LEVEL 3 - EST PT ICD-10 Diagnosis Description L97.822 Non-pressure chronic ulcer of other part of left lower leg with fat layer exposed I89.0 Lymphedema, not elsewhere classified I87.312 Chronic  venous hypertension (idiopathic) with ulcer of left lower extremity Quantity: 1 Electronic Signature(s) Signed: 05/18/2023 5:02:29 PM By: [name] Najjar MD Signed: 05/19/2023 1:18:15 PM By: [name] Pax RN Entered By: [name] [name] on 05/18/2023 08:15:05  [name] [name], [name] [name] ([id number]) [id number] Page 1 of 10 Visit Report for 05/18/2023 HPI Details Patient Name: Date of Service: [name] [name] 05/18/2023 10:15 [name] [name] Medical Record Number: [id number] Patient Account Number: [id number] Date of Birth/Sex: Treating RN: [birth year] (52 y.o. [name] [name]) [name] [name] Primary Care Provider: Maudie [name] Other Clinician: Referring Provider: Treating Provider/Extender: [name], [name] [name] in Treatment: 13 History of Present Illness HPI Description: 12/28/17; this is [name] now 52 year old man who is [name] type II diabetic. He was hospitalized from 10/01/17 through 10/19/17. He had an MSSA soft tissue and skin infection. 2 open areas on the left leg were identified he has [name] smaller area on the left medial calf superiorly just below the knee and [name] wound just above the left ankle on the posterior medial aspect. I think both of these were surgical IandD sites when he was in the hospital. He was discharged with [name] wound VAC at that point however this is since been taken off. He follows with Dr. Sampson Goon for the [facility] and he is still on chronic Keflex at 500 twice [name] day. At that time he was hospitalized his hemoglobin A1c was 15.1 however if I'[name] reading his endocrinologist notes correctly that is improved. He has been following with Dr. Lorretta Harp at [facility] and he has been applying calcium alginate and Unna boots. He has home health changing the dressing. They have also been attempting to get him external compression pumps although the patient is unaware whether they've been approved by insurance at this point. as mentioned he has [name] smaller clean wound on the right lateral calf just below the knee and he has [name] much larger area just above the left ankle medially and posteriorly. Our intake nurse reported greenish purulent looking drainage.the patient did have surgical material sent to pathology in February. This showed chronic  abscess The patient also has lymphedema stage III in the left greater than right lower extremities. He has [name] history of blisters with wounds but these of all were always healed. The patient thinks that the lymphedema may have been present since he was about 52 years old i.e. about 30 years. He does not have graded pressure stockings and has not worn stockings. He does not have [name] distant history of DVT PE or phlebitis. He has not been systemically unwell fever no chills. He states that his Lasix is recently been reduced. He tells me his kidney function is at "30%" and he has been followed by Dr. Thedore Mins of nephrology. At one point he was on Lasix 80 twice [name] day however that's been cut back and he is now on Lasix at 20 twice [name] day. The patient has [name] history of PAD listed in his records although he comes from Dr. Lorretta Harp I don't think is felt to have significant PAD. ABIs in our clinic were noncompressible bilaterally. 01/04/18; patient has [name] large wound on the left lateral lower calf and [name] small wound on the left medial upper calf. He has been to see his nephrologist who changed him to Demadex 40 mg [name] day. I'[name] hopeful this will help with his systemic fluid overload. He also has stage III lymphedema. Really no change in the 2 wounds since last week 01/11/18; the patient is down 13 pounds. He put his stage III lymphedema left leg in 4K compression last week and there is less edema fluid however we still haven't been able to communicate with home health but  under Kerlix/Coban. Wounds are smaller today. Patient has no issues or complaints. Originally patient had [name] wound VAC. He still has this in his possession but we are not using it. I recommended He returned this. He would also like new lymphedema pumps. 7/31; patient presents for follow-up. We have been using Dakin's wet-to-dry dressings under Kerlix/Coban to the left lower extremity larger wound and silver alginate to the smaller dorsal left foot wound. He was noted to have [name] fever during dialysis and was found to be bacteremic. He has been in the hospital for the past few days. He is receiving IV cefazolin during dialysis now. He has no issues or complaints about the wounds. The left dorsal foot wound is healed today. He currently denies systemic signs of infection. 8/7; patient presents for follow-up. We have been using silver alginate under Kerlix/Coban to the left lower extremity. Wound is smaller. 8/14; this patient has [name] reasonably large wound on the left leg. Dimensions are marginally impaired. He has chronic venous insufficiency and his edema is poorly controlled. We have been using kerlix Coban compression. Patient also has external compression pumps at home but he has not been using them. He also has dialysis Tuesday Thursdays and Saturdays 8/21; patient presents for follow-up. We have been using silver cell under compression therapy. The compression wrap was increased to 3 layer at last clinic visit. He states he tolerated this well. He has no issues or complaints today. Wound is smaller. 8/28; patient presents for follow-up. We have been using silver alginate under compression therapy to the left  lower extremity. Wound appears well-healing. Unfortunately has developed another wound just lateral to this that looks like it started out as [name] blister. 9/4; patient presents for follow-up. We have been using silver alginate under compression therapy to the left lower extremity. Wound is smaller. New wound at last clinic visit on the lateral aspect has closed. 9/11; patient presents for follow-up. We have been using silver alginate under compression therapy to the left lower extremity. Wound continues to become smaller. 9/18; patient with longstanding chronic lymphedema. Wound on the left medial lower leg. We have been using silver alginate under Urgo K2 lite. The wound is improved in terms of appearance and measurements per our intake nurse Electronic Signature(s) Signed: 05/18/2023 5:02:29 PM By: [name] Najjar MD Entered By: [name] [name] on 05/18/2023 07:41:43 [name] [name], [name] [name] ([id number]) [id number] Page 4 of 10 -------------------------------------------------------------------------------- Physical Exam Details Patient Name: Date of Service: [name] [name] 05/18/2023 10:15 [name] [name] Medical Record Number: [id number] Patient Account Number: [id number] Date of Birth/Sex: Treating RN: 1971/07/01 (52 y.o. [name] [name] Primary Care Provider: Maudie [name] Other Clinician: Referring Provider: Treating Provider/Extender: [name], [name] [name] in Treatment: 13 Constitutional Sitting or standing Blood Pressure is within target range for patient.. Pulse regular and within target range for patient.[name] Respirations regular, non-labored and within target range.. Temperature is normal and within the target range for the patient.[name] appears in no distress. Cardiovascular . Notes Wound exam; left lower extremity medially in the setting of chronic lymphedema venous stasis changes. The wound itself looks clean. No mechanical debridement was  necessary. No evidence of surrounding infection.Edema control is marginal Electronic Signature(s) Signed: 05/18/2023 5:02:29 PM By: [name] Najjar MD Entered By: [name] [name] on 05/18/2023 07:44:19 -------------------------------------------------------------------------------- Physician Orders Details Patient Name: Date of Service: [name] [name] 05/18/2023 10:15 [name] [name] Medical Record Number: [id number] Patient Account Number: [id number] Date of  [name] [name], [name] [name] ([id number]) [id number] Page 1 of 10 Visit Report for 05/18/2023 HPI Details Patient Name: Date of Service: [name] [name] 05/18/2023 10:15 [name] [name] Medical Record Number: [id number] Patient Account Number: [id number] Date of Birth/Sex: Treating RN: [birth year] (52 y.o. [name] [name]) [name] [name] Primary Care Provider: Maudie [name] Other Clinician: Referring Provider: Treating Provider/Extender: [name], [name] [name] in Treatment: 13 History of Present Illness HPI Description: 12/28/17; this is [name] now 52 year old man who is [name] type II diabetic. He was hospitalized from 10/01/17 through 10/19/17. He had an MSSA soft tissue and skin infection. 2 open areas on the left leg were identified he has [name] smaller area on the left medial calf superiorly just below the knee and [name] wound just above the left ankle on the posterior medial aspect. I think both of these were surgical IandD sites when he was in the hospital. He was discharged with [name] wound VAC at that point however this is since been taken off. He follows with Dr. Sampson Goon for the [facility] and he is still on chronic Keflex at 500 twice [name] day. At that time he was hospitalized his hemoglobin A1c was 15.1 however if I'[name] reading his endocrinologist notes correctly that is improved. He has been following with Dr. Lorretta Harp at [facility] and he has been applying calcium alginate and Unna boots. He has home health changing the dressing. They have also been attempting to get him external compression pumps although the patient is unaware whether they've been approved by insurance at this point. as mentioned he has [name] smaller clean wound on the right lateral calf just below the knee and he has [name] much larger area just above the left ankle medially and posteriorly. Our intake nurse reported greenish purulent looking drainage.the patient did have surgical material sent to pathology in February. This showed chronic  abscess The patient also has lymphedema stage III in the left greater than right lower extremities. He has [name] history of blisters with wounds but these of all were always healed. The patient thinks that the lymphedema may have been present since he was about 52 years old i.e. about 30 years. He does not have graded pressure stockings and has not worn stockings. He does not have [name] distant history of DVT PE or phlebitis. He has not been systemically unwell fever no chills. He states that his Lasix is recently been reduced. He tells me his kidney function is at "30%" and he has been followed by Dr. Thedore Mins of nephrology. At one point he was on Lasix 80 twice [name] day however that's been cut back and he is now on Lasix at 20 twice [name] day. The patient has [name] history of PAD listed in his records although he comes from Dr. Lorretta Harp I don't think is felt to have significant PAD. ABIs in our clinic were noncompressible bilaterally. 01/04/18; patient has [name] large wound on the left lateral lower calf and [name] small wound on the left medial upper calf. He has been to see his nephrologist who changed him to Demadex 40 mg [name] day. I'[name] hopeful this will help with his systemic fluid overload. He also has stage III lymphedema. Really no change in the 2 wounds since last week 01/11/18; the patient is down 13 pounds. He put his stage III lymphedema left leg in 4K compression last week and there is less edema fluid however we still haven't been able to communicate with home health but  Birth/Sex: Treating RN: June 07, 1971 (52 y.o. [name] [name]) [name] [name] Primary Care Provider: Maudie [name] Other Clinician: Referring Provider: Treating Provider/Extender: [name] BSO [name], [name] [name] in Treatment: 13 Verbal / Phone Orders: No Diagnosis Coding ICD-10 Coding Code Description [phone] Non-pressure chronic ulcer of other part of left lower leg with fat layer exposed L97.522 Non-pressure chronic ulcer of other part of left foot with fat layer exposed I87.312 Chronic venous hypertension (idiopathic) with ulcer of left lower extremity I87.321 Chronic venous hypertension (idiopathic) with inflammation of right lower extremity E11.622 Type 2 diabetes mellitus with other skin ulcer I89.0 Lymphedema, not elsewhere classified Follow-up Appointments Return Appointment in 1 week. [name] [name], [name] [name] ([id number]) [id number] Page 5 of 10 Home Health Home Health Company: - [facility] for wound care. May utilize formulary equivalent dressing for wound treatment orders unless otherwise specified. Home Health Nurse may visit PRN to address patients wound care needs. [name] [name] [phone] Change on Monday and Friday. Wound Care Center will change on Wednesday Scheduled days for dressing changes to be completed; exception, patient has scheduled wound care visit that day. **Please direct any NON-WOUND related issues/requests for orders to patient's Primary Care Physician. **If current dressing  causes regression in wound condition, may D/C ordered dressing product/s and apply Normal Saline Moist Dressing daily until next Wound Healing Center or Other MD appointment. **Notify Wound Healing Center of regression in wound condition at [phone]. Bathing/ Shower/ Hygiene May shower with wound dressing protected with water repellent cover or cast protector. No tub bath. Anesthetic (Use 'Patient Medications' Section for Anesthetic Order Entry) Lidocaine applied to wound bed Edema Control - Lymphedema / Segmental Compressive Device / Other Tubigrip double layer applied - right leg Elevate, Exercise Daily and [name] void Standing for Long Periods of Time. Elevate legs to the level of the heart and pump ankles as often as possible Elevate leg(s) parallel to the floor when sitting. Wound Treatment Wound #22 - Lower Leg Wound Laterality: Left Cleanser: Soap and Water 3 x Per Week/30 Days Discharge Instructions: Gently cleanse wound with antibacterial soap, rinse and pat dry prior to dressing wounds Cleanser: Wound Cleanser 3 x Per Week/30 Days Discharge Instructions: Wash your hands with soap and water. Remove old dressing, discard into plastic bag and place into trash. Cleanse the wound with Wound Cleanser prior to applying [name] clean dressing using gauze sponges, not tissues or cotton balls. Do not scrub or use excessive force. Pat dry using gauze sponges, not tissue or cotton balls. Prim Dressing: Silvercel 4 1/4x 4 1/4 (in/in) 3 x Per Week/30 Days ary Discharge Instructions: Apply Silvercel 4 1/4x 4 1/4 (in/in) as instructed Secondary Dressing: Zetuvit Plus 4x4 (in/in) 3 x Per Week/30 Days Secured With: [employer] Dressing, Latex-free, Size 5, Small-Head / Shoulder / Thigh 3 x Per Week/30 Days Discharge Instructions: over wrap for comfort Compression Wrap: Urgo K2 Lite, two layer compression system, large 3 x Per Week/30 Days Electronic Signature(s) Signed: 05/18/2023 5:02:29 PM By: [name] Najjar MD Signed: 05/19/2023 1:18:15 PM By: [name] Pax RN Entered By: [name] [name] on 05/18/2023 08:14:23 -------------------------------------------------------------------------------- Problem List Details Patient Name: Date of Service: [name] [name] 05/18/2023 10:15 [name] [name] Medical Record Number: [id number] Patient Account Number: [id number] Date of Birth/Sex: Treating RN: 1971/08/10 (52 y.o. [name] [name] Primary Care Provider: Maudie [name] Other Clinician: Referring Provider: Treating Provider/Extender: [name], [name] [name] in Treatment: 13 Active Problems ICD-10 Encounter Code Description  No mechanical debridement was necessary. No evidence of surrounding infection.Edema control is marginal Integumentary (Hair, Skin) Wound #22 status is Open. Original cause of wound was Gradually Appeared. The date acquired was: 08/30/2022. The wound has been in treatment 13 weeks. The wound is located on the Left Lower Leg. The wound measures 2cm length x 2.5cm width x 0.1cm depth; 3.927cm^2 area and 0.393cm^3 volume. There is Fat Layer (Subcutaneous Tissue) exposed. There is no tunneling or undermining noted. There is [name] medium amount of serosanguineous drainage noted. There is large (67-100%) red granulation within the wound bed. There is [name] small (1-33%) amount of necrotic tissue within the wound bed including Adherent Slough. Assessment Active Problems ICD-10 Non-pressure chronic ulcer of other part of left lower leg with fat layer exposed Non-pressure chronic ulcer of other part of left foot  with fat layer exposed Chronic venous hypertension (idiopathic) with ulcer of left lower extremity Chronic venous hypertension (idiopathic) with inflammation of right lower extremity Type 2 diabetes mellitus with other skin ulcer Lymphedema, not elsewhere classified Plan 1. No need at this point to change the primary dressing which is Hydrofera Blue under and Urgo K2 lite 2. We certainly could increase the compression if necessary based on clinical exam at the bedside 3. The patient has compression pumps at home I have asked him to use these at least once [name] day even over our existing compression Electronic Signature(s) Signed: 05/18/2023 5:02:29 PM By: [name] Najjar MD Entered By: [name] [name] on 05/18/2023 07:45:06 [name] [name], [name] [name] ([id number]) [id number] Page 10 of 10 -------------------------------------------------------------------------------- SuperBill Details Patient Name: Date of Service: [name] [name] 05/18/2023 Medical Record Number: [id number] Patient Account Number: [id number] Date of Birth/Sex: Treating RN: [birth year] (52 y.o. [name] [name]) [name] [name] Primary Care Provider: Maudie [name] Other Clinician: Referring Provider: Treating Provider/Extender: [name], [name] [name] in Treatment: 13 Diagnosis Coding ICD-10 Codes Code Description [phone] Non-pressure chronic ulcer of other part of left lower leg with fat layer exposed L97.522 Non-pressure chronic ulcer of other part of left foot with fat layer exposed I87.312 Chronic venous hypertension (idiopathic) with ulcer of left lower extremity I87.321 Chronic venous hypertension (idiopathic) with inflammation of right lower extremity E11.622 Type 2 diabetes mellitus with other skin ulcer I89.0 Lymphedema, not elsewhere classified Facility Procedures : CPT4 Code: 21308657 Description: (Facility Use Only) 29581LT - APPLY MULTLAY COMPRS LWR LT LEG Modifier: Quantity:  1 Physician Procedures : CPT4 Code Description Modifier [id number] - WC PHYS LEVEL 3 - EST PT ICD-10 Diagnosis Description L97.822 Non-pressure chronic ulcer of other part of left lower leg with fat layer exposed I89.0 Lymphedema, not elsewhere classified I87.312 Chronic  venous hypertension (idiopathic) with ulcer of left lower extremity Quantity: 1 Electronic Signature(s) Signed: 05/18/2023 5:02:29 PM By: [name] Najjar MD Signed: 05/19/2023 1:18:15 PM By: [name] Pax RN Entered By: [name] [name] on 05/18/2023 08:15:05  No mechanical debridement was necessary. No evidence of surrounding infection.Edema control is marginal Integumentary (Hair, Skin) Wound #22 status is Open. Original cause of wound was Gradually Appeared. The date acquired was: 08/30/2022. The wound has been in treatment 13 weeks. The wound is located on the Left Lower Leg. The wound measures 2cm length x 2.5cm width x 0.1cm depth; 3.927cm^2 area and 0.393cm^3 volume. There is Fat Layer (Subcutaneous Tissue) exposed. There is no tunneling or undermining noted. There is [name] medium amount of serosanguineous drainage noted. There is large (67-100%) red granulation within the wound bed. There is [name] small (1-33%) amount of necrotic tissue within the wound bed including Adherent Slough. Assessment Active Problems ICD-10 Non-pressure chronic ulcer of other part of left lower leg with fat layer exposed Non-pressure chronic ulcer of other part of left foot  with fat layer exposed Chronic venous hypertension (idiopathic) with ulcer of left lower extremity Chronic venous hypertension (idiopathic) with inflammation of right lower extremity Type 2 diabetes mellitus with other skin ulcer Lymphedema, not elsewhere classified Plan 1. No need at this point to change the primary dressing which is Hydrofera Blue under and Urgo K2 lite 2. We certainly could increase the compression if necessary based on clinical exam at the bedside 3. The patient has compression pumps at home I have asked him to use these at least once [name] day even over our existing compression Electronic Signature(s) Signed: 05/18/2023 5:02:29 PM By: [name] Najjar MD Entered By: [name] [name] on 05/18/2023 07:45:06 [name] [name], [name] [name] ([id number]) [id number] Page 10 of 10 -------------------------------------------------------------------------------- SuperBill Details Patient Name: Date of Service: [name] [name] 05/18/2023 Medical Record Number: [id number] Patient Account Number: [id number] Date of Birth/Sex: Treating RN: [birth year] (52 y.o. [name] [name]) [name] [name] Primary Care Provider: Maudie [name] Other Clinician: Referring Provider: Treating Provider/Extender: [name], [name] [name] in Treatment: 13 Diagnosis Coding ICD-10 Codes Code Description [phone] Non-pressure chronic ulcer of other part of left lower leg with fat layer exposed L97.522 Non-pressure chronic ulcer of other part of left foot with fat layer exposed I87.312 Chronic venous hypertension (idiopathic) with ulcer of left lower extremity I87.321 Chronic venous hypertension (idiopathic) with inflammation of right lower extremity E11.622 Type 2 diabetes mellitus with other skin ulcer I89.0 Lymphedema, not elsewhere classified Facility Procedures : CPT4 Code: 21308657 Description: (Facility Use Only) 29581LT - APPLY MULTLAY COMPRS LWR LT LEG Modifier: Quantity:  1 Physician Procedures : CPT4 Code Description Modifier [id number] - WC PHYS LEVEL 3 - EST PT ICD-10 Diagnosis Description L97.822 Non-pressure chronic ulcer of other part of left lower leg with fat layer exposed I89.0 Lymphedema, not elsewhere classified I87.312 Chronic  venous hypertension (idiopathic) with ulcer of left lower extremity Quantity: 1 Electronic Signature(s) Signed: 05/18/2023 5:02:29 PM By: [name] Najjar MD Signed: 05/19/2023 1:18:15 PM By: [name] Pax RN Entered By: [name] [name] on 05/18/2023 08:15:05

## 2023-06-25 DIAGNOSIS — N2581 Secondary hyperparathyroidism of renal origin: Secondary | ICD-10-CM | POA: Diagnosis not present

## 2023-06-25 DIAGNOSIS — N186 End stage renal disease: Secondary | ICD-10-CM | POA: Diagnosis not present

## 2023-06-25 DIAGNOSIS — Z992 Dependence on renal dialysis: Secondary | ICD-10-CM | POA: Diagnosis not present

## 2023-06-27 ENCOUNTER — Encounter (INDEPENDENT_AMBULATORY_CARE_PROVIDER_SITE_OTHER): Payer: Self-pay | Admitting: Vascular Surgery

## 2023-06-27 ENCOUNTER — Encounter: Payer: Self-pay | Admitting: Urology

## 2023-06-27 ENCOUNTER — Ambulatory Visit (INDEPENDENT_AMBULATORY_CARE_PROVIDER_SITE_OTHER): Payer: Medicare HMO | Admitting: Urology

## 2023-06-27 ENCOUNTER — Ambulatory Visit (INDEPENDENT_AMBULATORY_CARE_PROVIDER_SITE_OTHER): Payer: Medicare HMO | Admitting: Vascular Surgery

## 2023-06-27 ENCOUNTER — Ambulatory Visit (INDEPENDENT_AMBULATORY_CARE_PROVIDER_SITE_OTHER): Payer: Medicare HMO

## 2023-06-27 VITALS — BP 152/76 | HR 77 | Ht 76.0 in | Wt 387.0 lb

## 2023-06-27 VITALS — BP 146/76 | HR 71 | Resp 16 | Wt 388.4 lb

## 2023-06-27 DIAGNOSIS — E349 Endocrine disorder, unspecified: Secondary | ICD-10-CM

## 2023-06-27 DIAGNOSIS — I872 Venous insufficiency (chronic) (peripheral): Secondary | ICD-10-CM

## 2023-06-27 DIAGNOSIS — E291 Testicular hypofunction: Secondary | ICD-10-CM

## 2023-06-27 DIAGNOSIS — L97329 Non-pressure chronic ulcer of left ankle with unspecified severity: Secondary | ICD-10-CM | POA: Diagnosis not present

## 2023-06-27 DIAGNOSIS — I120 Hypertensive chronic kidney disease with stage 5 chronic kidney disease or end stage renal disease: Secondary | ICD-10-CM | POA: Diagnosis not present

## 2023-06-27 DIAGNOSIS — I10 Essential (primary) hypertension: Secondary | ICD-10-CM | POA: Diagnosis not present

## 2023-06-27 DIAGNOSIS — Z794 Long term (current) use of insulin: Secondary | ICD-10-CM | POA: Diagnosis not present

## 2023-06-27 DIAGNOSIS — E113593 Type 2 diabetes mellitus with proliferative diabetic retinopathy without macular edema, bilateral: Secondary | ICD-10-CM | POA: Diagnosis not present

## 2023-06-27 DIAGNOSIS — I83023 Varicose veins of left lower extremity with ulcer of ankle: Secondary | ICD-10-CM | POA: Diagnosis not present

## 2023-06-27 DIAGNOSIS — N186 End stage renal disease: Secondary | ICD-10-CM

## 2023-06-27 DIAGNOSIS — E785 Hyperlipidemia, unspecified: Secondary | ICD-10-CM | POA: Diagnosis not present

## 2023-06-27 DIAGNOSIS — E1122 Type 2 diabetes mellitus with diabetic chronic kidney disease: Secondary | ICD-10-CM | POA: Diagnosis not present

## 2023-06-27 DIAGNOSIS — N529 Male erectile dysfunction, unspecified: Secondary | ICD-10-CM

## 2023-06-27 DIAGNOSIS — D352 Benign neoplasm of pituitary gland: Secondary | ICD-10-CM

## 2023-06-27 DIAGNOSIS — E221 Hyperprolactinemia: Secondary | ICD-10-CM | POA: Diagnosis not present

## 2023-06-27 DIAGNOSIS — E1165 Type 2 diabetes mellitus with hyperglycemia: Secondary | ICD-10-CM

## 2023-06-27 DIAGNOSIS — E1151 Type 2 diabetes mellitus with diabetic peripheral angiopathy without gangrene: Secondary | ICD-10-CM | POA: Diagnosis not present

## 2023-06-27 NOTE — Progress Notes (Signed)
more) []  - 0 Hypo/Hyperglycemic Management (do not check if billed separately) []  - 0 Ankle / Brachial Index (ABI) - do not check if billed separately Has the patient been seen at the hospital within the last three years: Yes Total Score: 0 Level Of Care: ____ Electronic Signature(s) Signed: 06/27/2023 8:00:25 AM By: [name] RN Entered By: [name] on 06/22/2023 05:39:50 -------------------------------------------------------------------------------- Compression Therapy Details Patient Name: Date of Service: [name], [name] 06/22/2023 8:00 [name] Medical Record Number: [id number] Patient Account Number: [id number] Date of Birth/Sex: Treating RN: [birth year] (52 y.o. [name] Primary Care Ilia Dimaano: [name] Other Clinician: Referring Dory Demont: Treating Alexiz Cothran/Extender: Chauncey Mann, [name] Ether, Provencio, Ledon [name] ([id number]) [id number] Page 3 of 9 Weeks in Treatment: 18 Compression Therapy Performed for Wound Assessment: Wound #22 Left Lower Leg Performed By: Clinician [name], RN Compression Type: Double Layer Post Procedure Diagnosis Same as Pre-procedure Electronic Signature(s) Signed: 06/27/2023 8:00:25 AM By: [name] RN Entered By: [name] on 06/22/2023 05:39:41 -------------------------------------------------------------------------------- Encounter Discharge Information Details Patient Name: Date of Service: [name], [name] 06/22/2023 8:00 [name] Medical Record Number: [id number] Patient Account Number: [id number] Date of Birth/Sex: Treating RN: 06-18-1971 (52 y.o. [name] Primary Care Jakia Kennebrew: [name] Other Clinician: Referring Flecia Shutter: Treating Nur Rabold/Extender: [name] BSO [name], [name] [place] in Treatment: 18 Encounter Discharge Information  Items Discharge Condition: Stable Ambulatory Status: Ambulatory Discharge Destination: Home Transportation: Private Auto Accompanied By: self Schedule Follow-up Appointment: Yes Clinical Summary of Care: Electronic Signature(s) Signed: 06/27/2023 8:00:25 AM By: [name] RN Entered By: [name] on 06/22/2023 05:40:36 -------------------------------------------------------------------------------- Lower Extremity Assessment Details Patient Name: Date of Service: [name], [name] 06/22/2023 8:00 [name] Medical Record Number: [id number] Patient Account Number: [id number] Date of Birth/Sex: Treating RN: 03-01-1971 (52 y.o. [name] Primary Care Dellas Guard: [name] Other Clinician: Referring Jalana Moore: Treating Ayjah Show/Extender: [name], [name] [place] in Treatment: 18 Edema Assessment H[Left: OLT, [name] ([id number])] [Right: [id number] Page 4 of 9] Assessed: [Left: No] [Right: No] Edema: [Left: Ye] [Right: s] Calf Left: Right: Point of Measurement: 40 cm From Medial Instep 45 cm Ankle Left: Right: Point of Measurement: 10 cm From Medial Instep 43 cm Knee To Floor Left: Right: From Medial Instep 50 cm Vascular Assessment Pulses: Dorsalis Pedis Palpable: [Left:Yes] Extremity colors, hair growth, and conditions: Extremity Color: [Left:Hyperpigmented] Hair Growth on Extremity: [Left:No] Temperature of Extremity: [Left:Warm] Capillary Refill: [Left:< 3 seconds] Dependent Rubor: [Left:No] Blanched when Elevated: [Left:No Yes] Toe Nail Assessment Left: Right: Thick: Yes Discolored: Yes Deformed: Yes Improper Length and Hygiene: Yes Electronic Signature(s) Signed: 06/27/2023 8:00:25 AM By: [name] RN Entered By: [name] on 06/22/2023 05:19:53 -------------------------------------------------------------------------------- Multi Wound Chart Details Patient Name: Date of Service: [name], CO SLO W  [name] 06/22/2023 8:00 [name] Medical Record Number: [id number] Patient Account Number: [id number] Date of Birth/Sex: Treating RN: 1970-10-20 (52 y.o. [name]) [name] Primary Care Mkayla Steele: [name] Other Clinician: Referring Heyden Jaber: Treating Mikayla Chiusano/Extender: [name], [name] [place] in Treatment: 18 Vital Signs Height(in): 76 Pulse(bpm): 84 Weight(lbs): 400 Blood Pressure(mmHg): 129/87 Body Mass Index(BMI): 48.7 Temperature(F): 98.1 Respiratory Rate(breaths/min): 18 [22:Photos:] [name], [name] ([id number]) [22:Photos:] [[name]/[name]:[name]/[name]] Left Lower Leg [name] Wound Location: Gradually Appeared [name] Wounding Event: Diabetic Wound/Ulcer of the Lower [name] Primary Etiology: Extremity Hypertension, Type II Diabetes, [name] Comorbid History: Neuropathy  [name], [name] ([id number]) [phone]) [id number] Page 1 of 9 Visit Report for 06/22/2023 Arrival Information Details Patient Name: Date of Service: [name], [name] 06/22/2023 8:00 [name] Medical Record Number: [id number] Patient Account Number: [id number] Date of Birth/Sex: Treating RN: 03-27-1971 (52 y.o. [name]) [name] Primary Care Karli Wickizer: [name] Other Clinician: Referring Demmi Sindt: Treating Jerod Mcquain/Extender: [name], [name] [place] in Treatment: 18 Visit Information History Since Last Visit Added or deleted any medications: No Patient Arrived: Ambulatory Any new allergies or adverse reactions: No Arrival Time: 08:12 Had [name] fall or experienced change in No Accompanied By: self activities of daily living that may affect Transfer Assistance: None risk of falls: Patient Identification Verified: Yes Signs or symptoms of abuse/neglect since last visito No Secondary Verification Process Completed: Yes Hospitalized since last visit: No Patient Requires Transmission-Based Precautions: No Implantable device outside of the clinic excluding No Patient Has Alerts: Yes cellular tissue based products placed in the center Patient Alerts: Patient on Blood Thinner since last visit: ABI R [place] TBI .61 10/11/22 Has Dressing in Place as Prescribed: Yes ABI L  TBI .33 10/11/22 Has Compression in Place as Prescribed: Yes Pain Present Now: No Electronic Signature(s) Signed: 06/27/2023 8:00:25 AM By: [name] RN Entered By: [name] on 06/22/2023 05:13:14 -------------------------------------------------------------------------------- Clinic Level of Care Assessment Details Patient Name: Date of Service: [name], [name] 06/22/2023 8:00 [name] Medical Record Number: [id number] Patient Account Number: [id number] Date of Birth/Sex: Treating RN: 01/02/1971 (52 y.o. [name] Primary Care Saraiyah Hemminger: [name] Other  Clinician: Referring Karnisha Lefebre: Treating Kennedy Brines/Extender: [name], [name] [place] in Treatment: 18 Clinic Level of Care Assessment Items TOOL 1 Quantity Score []  - 0 Use when EandM and Procedure is performed on INITIAL visit ASSESSMENTS - Nursing Assessment / Reassessment []  - 0 General Physical Exam (combine w/ comprehensive assessment (listed just below) when performed on new pt. evals) []  - 0 Comprehensive Assessment (HX, ROS, Risk Assessments, Wounds Hx, etc.) [name], [name] ([id number]) [id number] Page 2 of 9 ASSESSMENTS - Wound and Skin Assessment / Reassessment []  - 0 Dermatologic / Skin Assessment (not related to wound area) ASSESSMENTS - Ostomy and/or Continence Assessment and Care []  - 0 Incontinence Assessment and Management []  - 0 Ostomy Care Assessment and Management (repouching, etc.) PROCESS - Coordination of Care []  - 0 Simple Patient / Family Education for ongoing care []  - 0 Complex (extensive) Patient / Family Education for ongoing care []  - 0 Staff obtains Chiropractor, Records, T Results / Process Orders est []  - 0 Staff telephones HHA, Nursing Homes / Clarify orders / etc []  - 0 Routine Transfer to another Facility (non-emergent condition) []  - 0 Routine Hospital Admission (non-emergent condition) []  - 0 New Admissions / Manufacturing engineer / Ordering NPWT Apligraf, etc. , []  - 0 Emergency Hospital Admission (emergent condition) PROCESS - Special Needs []  - 0 Pediatric / Minor Patient Management []  - 0 Isolation Patient Management []  - 0 Hearing / Language / Visual special needs []  - 0 Assessment of Community assistance (transportation, D/C planning, etc.) []  - 0 Additional assistance / Altered mentation []  - 0 Support Surface(s) Assessment (bed, cushion, seat, etc.) INTERVENTIONS - Miscellaneous []  - 0 External ear exam []  - 0 Patient Transfer (multiple staff / Nurse, adult / Similar  devices) []  - 0 Simple Staple / Suture removal (25 or less) []  - 0 Complex Staple / Suture removal (26 or  [name], [name] ([id number]) [id number]) [id number] Page 1 of 9 Visit Report for 06/22/2023 Arrival Information Details Patient Name: Date of Service: [name], [name] 06/22/2023 8:00 [name] Medical Record Number: [id number] Patient Account Number: [id number] Date of Birth/Sex: Treating RN: 03-27-1971 (52 y.o. [name]) [name] Primary Care Karli Wickizer: [name] Other Clinician: Referring Demmi Sindt: Treating Jerod Mcquain/Extender: [name], [name] [place] in Treatment: 18 Visit Information History Since Last Visit Added or deleted any medications: No Patient Arrived: Ambulatory Any new allergies or adverse reactions: No Arrival Time: 08:12 Had [name] fall or experienced change in No Accompanied By: self activities of daily living that may affect Transfer Assistance: None risk of falls: Patient Identification Verified: Yes Signs or symptoms of abuse/neglect since last visito No Secondary Verification Process Completed: Yes Hospitalized since last visit: No Patient Requires Transmission-Based Precautions: No Implantable device outside of the clinic excluding No Patient Has Alerts: Yes cellular tissue based products placed in the center Patient Alerts: Patient on Blood Thinner since last visit: ABI R [place] TBI .61 10/11/22 Has Dressing in Place as Prescribed: Yes ABI L  TBI .33 10/11/22 Has Compression in Place as Prescribed: Yes Pain Present Now: No Electronic Signature(s) Signed: 06/27/2023 8:00:25 AM By: [name] RN Entered By: [name] on 06/22/2023 05:13:14 -------------------------------------------------------------------------------- Clinic Level of Care Assessment Details Patient Name: Date of Service: [name], [name] 06/22/2023 8:00 [name] Medical Record Number: [id number] Patient Account Number: [id number] Date of Birth/Sex: Treating RN: 01/02/1971 (52 y.o. [name] Primary Care Saraiyah Hemminger: [name] Other  Clinician: Referring Karnisha Lefebre: Treating Kennedy Brines/Extender: [name], [name] [place] in Treatment: 18 Clinic Level of Care Assessment Items TOOL 1 Quantity Score []  - 0 Use when EandM and Procedure is performed on INITIAL visit ASSESSMENTS - Nursing Assessment / Reassessment []  - 0 General Physical Exam (combine w/ comprehensive assessment (listed just below) when performed on new pt. evals) []  - 0 Comprehensive Assessment (HX, ROS, Risk Assessments, Wounds Hx, etc.) [name], [name] ([id number]) [id number] Page 2 of 9 ASSESSMENTS - Wound and Skin Assessment / Reassessment []  - 0 Dermatologic / Skin Assessment (not related to wound area) ASSESSMENTS - Ostomy and/or Continence Assessment and Care []  - 0 Incontinence Assessment and Management []  - 0 Ostomy Care Assessment and Management (repouching, etc.) PROCESS - Coordination of Care []  - 0 Simple Patient / Family Education for ongoing care []  - 0 Complex (extensive) Patient / Family Education for ongoing care []  - 0 Staff obtains Chiropractor, Records, T Results / Process Orders est []  - 0 Staff telephones HHA, Nursing Homes / Clarify orders / etc []  - 0 Routine Transfer to another Facility (non-emergent condition) []  - 0 Routine Hospital Admission (non-emergent condition) []  - 0 New Admissions / Manufacturing engineer / Ordering NPWT Apligraf, etc. , []  - 0 Emergency Hospital Admission (emergent condition) PROCESS - Special Needs []  - 0 Pediatric / Minor Patient Management []  - 0 Isolation Patient Management []  - 0 Hearing / Language / Visual special needs []  - 0 Assessment of Community assistance (transportation, D/C planning, etc.) []  - 0 Additional assistance / Altered mentation []  - 0 Support Surface(s) Assessment (bed, cushion, seat, etc.) INTERVENTIONS - Miscellaneous []  - 0 External ear exam []  - 0 Patient Transfer (multiple staff / Nurse, adult / Similar  devices) []  - 0 Simple Staple / Suture removal (25 or less) []  - 0 Complex Staple / Suture removal (26 or  [name] 06/22/2023 8:00 [name] Medical Record Number: [id number] Patient Account Number: [id number] Date of Birth/Sex: Treating RN: April 29, 1971 (52 y.o. [name]) [name] Primary Care Shayla Heming: [name] Other Clinician: Referring Shanie Mauzy: Treating Nikyla Navedo/Extender: [name], [name] [place] in Treatment: 18 Wound Status Wound Number: 22 Primary Etiology: Diabetic Wound/Ulcer of the  Lower Extremity Wound Location: Left Lower Leg Wound Status: Open Wounding Event: Gradually Appeared Comorbid History: Hypertension, Type II Diabetes, Neuropathy Date Acquired: 08/30/2022 [name], [name] [id number] Page 8 of 9 Weeks Of Treatment: 18 Clustered Wound: No Photos Wound Measurements Length: (cm) 0.8 Width: (cm) 0.9 Depth: (cm) 0.1 Area: (cm) 0.565 Volume: (cm) 0.057 % Reduction in Area: 99.4% % Reduction in Volume: 99.9% Epithelialization: None Tunneling: No Undermining: No Wound Description Classification: Grade 2 Exudate Amount: Medium Exudate Type: Serosanguineous Exudate Color: red, brown Foul Odor After Cleansing: No Slough/Fibrino Yes Wound Bed Granulation Amount: Large (67-100%) Exposed Structure Granulation Quality: Red Fascia Exposed: No Necrotic Amount: Small (1-33%) Fat Layer (Subcutaneous Tissue) Exposed: Yes Necrotic Quality: Adherent Slough Tendon Exposed: No Muscle Exposed: No Joint Exposed: No Bone Exposed: No Treatment Notes Wound #22 (Lower Leg) Wound Laterality: Left Cleanser Soap and Water Discharge Instruction: Gently cleanse wound with antibacterial soap, rinse and pat dry prior to dressing wounds Wound Cleanser Discharge Instruction: Wash your hands with soap and water. Remove old dressing, discard into plastic bag and place into trash. Cleanse the wound with Wound Cleanser prior to applying [name] clean dressing using gauze sponges, not tissues or cotton balls. Do not scrub or use excessive force. Pat dry using gauze sponges, not tissue or cotton balls. Peri-Wound Care Topical Primary Dressing Hydrofera Blue Ready Transfer Foam, 2.5x2.5 (in/in) Discharge Instruction: Apply Hydrofera Blue Ready to wound bed as directed Secondary Dressing Zetuvit Plus 4x4 (in/in) Secured With [employer] Dressing, Latex-free, Size 5, Small-Head / Shoulder / Thigh Discharge Instruction: over wrap for  comfort Compression Wrap Urgo K2, two layer compression system, large Compression Stockings Add-Ons Electronic Signature(s) Signed: 06/27/2023 8:00:25 AM By: [name] RN [name], [name] ([id number]) [id number] Page 9 of 9 Entered By: [name] on 06/22/2023 05:18:36 -------------------------------------------------------------------------------- Vitals Details Patient Name: Date of Service: [name], [name] 06/22/2023 8:00 [name] Medical Record Number: [id number] Patient Account Number: [id number] Date of Birth/Sex: Treating RN: 1970-12-24 (52 y.o. [name]) [name] Primary Care Kensli Bowley: [name] Other Clinician: Referring Neale Marzette: Treating Vester Titsworth/Extender: [name], [name] [place] in Treatment: 18 Vital Signs Time Taken: 08:13 Temperature (F): 98.1 Height (in): 76 Pulse (bpm): 84 Weight (lbs): 400 Respiratory Rate (breaths/min): 18 Body Mass Index (BMI): 48.7 Blood Pressure (mmHg): 129/87 Reference Range: 80 - 120 mg / dl Electronic Signature(s) Signed: 06/27/2023 8:00:25 AM By: [name] RN Entered By: [name] on 06/22/2023 05:13:42

## 2023-06-27 NOTE — H&P (View-Only) (Signed)
oz (176.2 kg)   Body mass index is 47.28 kg/m. Gen: WD/WN, NAD Head: [place]/AT, No temporalis wasting.  Ear/Nose/Throat: Hearing grossly intact, nares w/o erythema or drainage Eyes: PER, EOMI, sclera nonicteric.  Neck: Supple, no gross masses or lesions.  No JVD.  Pulmonary:  Good air movement, no audible  wheezing, no use of accessory muscles.  Cardiac: RRR, precordium non-hyperdynamic. Vascular:   Left arm AV graft weak thrill weak bruit left leg is wrapped Vessel Right Left  Radial Palpable Palpable  Brachial Palpable Palpable  Gastrointestinal: soft, non-distended. No guarding/no peritoneal signs.  Musculoskeletal: M/S 5/5 throughout.  No deformity.  Neurologic: CN 2-12 intact. Pain and light touch intact in extremities.  Symmetrical.  Speech is fluent. Motor exam as listed above. Psychiatric: Judgment intact, Mood & affect appropriate for pt's clinical situation. Dermatologic: No rashes or ulcers noted.  No changes consistent with cellulitis.   CBC Lab Results  Component Value Date   WBC 5.7 03/29/2023   HGB 10.9 (L) 03/29/2023   HCT 34.0 (L) 03/29/2023   MCV 93.2 03/29/2023   PLT 151 03/29/2023    BMET    Component Value Date/Time   NA 138 03/29/2023 0620   K 4.1 03/29/2023 0620   CL 99 03/29/2023 0620   CO2 24 03/29/2023 0620   GLUCOSE 124 (H) 03/29/2023 0620   BUN 82 (H) 03/29/2023 0620   CREATININE 13.30 (H) 03/29/2023 0620   CALCIUM 9.5 03/29/2023 0620   GFRNONAA 4 (L) 03/29/2023 0620   GFRAA 9 (L) 02/19/2020 1316   CrCl cannot be calculated (Patient's most recent lab result is older than the maximum 21 days allowed.).  COAG Lab Results  Component Value Date   INR 1.0 03/24/2023   INR 1.1 11/12/2022   INR 1.0 11/10/2022    Radiology VAS US DUPLEX DIALYSIS ACCESS (AVF, AVG)  Result Date: 06/27/2023 DIALYSIS ACCESS Patient Name:  [name]  Date of Exam:   06/27/2023 Medical Rec #: [id number]      Accession #:    [id number] Date of Birth: [birth year]      Patient Gender: M Patient Age:   52 years Exam Location:  [facility] Procedure:      VAS US DUPLEX DIALYSIS ACCESS (AVF, AVG) Referring Phys: [name] --------------------------------------------------------------------------------  Reason for Exam: Routine follow up. Access Site: Left  Upper Extremity. Access Type: Forearm loop AVG. History: Patient reports recent interventions to his left forearm graft from an          outside office. Performing Technologist: Jamse Mead RT, RDMS, RVT  Examination Guidelines: A complete evaluation includes B-mode imaging, spectral Doppler, color Doppler, and power Doppler as needed of all accessible portions of each vessel. Unilateral testing is considered an integral part of a complete examination. Limited examinations for reoccurring indications may be performed as noted.  Findings: +--------------------+----------+-----------------+-------------------------+ AVG                 PSV (cm/s)Flow Vol (mL/min)        Describe          +--------------------+----------+-----------------+-------------------------+ Native artery inflow   151           773       high brachial bifurcation +--------------------+----------+-----------------+-------------------------+ Arterial anastomosis   664                        partially-occlusive    +--------------------+----------+-----------------+-------------------------+ Prox graft  MRN : [id number]  [name] is a 52 y.o. ([birth year]) male who presents with chief complaint of check access.  History of Present Illness:   The patient returns to the office for follow up regarding a problem with their dialysis access.   The patient notes a significant increase in problems with dialysis.  It is reported that adequate dialysis is not being achieved.    He notes that he was recently sent to [facility] for a fistulogram at which time they did an angioplasty.  The patient denies hand pain or other symptoms consistent with steal phenomena.  No significant arm swelling.  The patient denies redness or swelling at the access site. The patient denies fever or chills at home or while on dialysis.  No recent shortening of the patient's walking distance or new symptoms consistent with claudication.  No history of rest pain symptoms.   The patient reports that he has a 1-1/2 cm residual wound that things have been going well and he has been steadily improving and hopes to be healed soon  The patient denies amaurosis fugax or recent TIA symptoms. There are no recent neurological changes noted. There is no history of DVT, PE or superficial thrombophlebitis. No recent episodes of angina or shortness of breath documented.   Duplex ultrasound of the AV access shows a patent access.  The previously noted stenosis is significantly increased compared to last study.  Flow volume today is 773 cc/min (previous flow volume was 1312 cc/min)  Current Meds  Medication Sig   ACCU-CHEK GUIDE test strip USE 1 STRIP TOTAL 2 TIMES DAILY FOR 90 DAYS   acetaminophen (TYLENOL) 325 MG tablet Take 2 tablets (650 mg total) by mouth every 6 (six) hours as needed for mild pain (or Fever >/= 101).   allopurinol (ZYLOPRIM) 100 MG tablet Take 100 mg by mouth daily.   amLODipine (NORVASC) 10 MG tablet Take 5 mg by mouth daily as needed (if bp is 190/90 or higher).    apixaban (ELIQUIS) 5 MG  TABS tablet Take 1 tablet (5 mg total) by mouth 2 (two) times daily.   atenolol (TENORMIN) 50 MG tablet Take 25 mg by mouth daily as needed (if bp is 190/90 or higher).    atorvastatin (LIPITOR) 80 MG tablet Take 80 mg by mouth at bedtime.   B Complex-C-Folic Acid (RENA-VITE RX) 1 MG TABS Take 1 tablet by mouth 3 (three) times a week.    calcium acetate (PHOSLO) 667 MG capsule Take 1,334-2,001 mg by mouth See admin instructions. Take 2001 mg by mouth with each meal & take 1334 mg by mouth with each snack.   cinacalcet (SENSIPAR) 30 MG tablet Take 30 mg by mouth daily.   CINNAMON PO Take 500 mg by mouth 2 (two) times daily.    clopidogrel (PLAVIX) 75 MG tablet TAKE 1 TABLET BY MOUTH EVERY DAY   Continuous Glucose Sensor (DEXCOM G7 SENSOR) MISC USE 1 EACH EVERY 10 (TEN) DAYS   Insulin Glargine (LANTUS SOLOSTAR) 100 UNIT/ML Solostar Pen Inject 20 Units into the skin at bedtime.   lidocaine-prilocaine (EMLA) cream Apply 1 application topically as needed (port access).   Melatonin 5 MG CAPS Take 1 capsule by mouth at bedtime as needed.   Multiple Vitamin (MULTIVITAMIN WITH MINERALS) TABS tablet Take 1 tablet by mouth 3 (three) times a week. Men's Multivitamin   Multiple  oz (176.2 kg)   Body mass index is 47.28 kg/m. Gen: WD/WN, NAD Head: [place]/AT, No temporalis wasting.  Ear/Nose/Throat: Hearing grossly intact, nares w/o erythema or drainage Eyes: PER, EOMI, sclera nonicteric.  Neck: Supple, no gross masses or lesions.  No JVD.  Pulmonary:  Good air movement, no audible  wheezing, no use of accessory muscles.  Cardiac: RRR, precordium non-hyperdynamic. Vascular:   Left arm AV graft weak thrill weak bruit left leg is wrapped Vessel Right Left  Radial Palpable Palpable  Brachial Palpable Palpable  Gastrointestinal: soft, non-distended. No guarding/no peritoneal signs.  Musculoskeletal: M/S 5/5 throughout.  No deformity.  Neurologic: CN 2-12 intact. Pain and light touch intact in extremities.  Symmetrical.  Speech is fluent. Motor exam as listed above. Psychiatric: Judgment intact, Mood & affect appropriate for pt's clinical situation. Dermatologic: No rashes or ulcers noted.  No changes consistent with cellulitis.   CBC Lab Results  Component Value Date   WBC 5.7 03/29/2023   HGB 10.9 (L) 03/29/2023   HCT 34.0 (L) 03/29/2023   MCV 93.2 03/29/2023   PLT 151 03/29/2023    BMET    Component Value Date/Time   NA 138 03/29/2023 0620   K 4.1 03/29/2023 0620   CL 99 03/29/2023 0620   CO2 24 03/29/2023 0620   GLUCOSE 124 (H) 03/29/2023 0620   BUN 82 (H) 03/29/2023 0620   CREATININE 13.30 (H) 03/29/2023 0620   CALCIUM 9.5 03/29/2023 0620   GFRNONAA 4 (L) 03/29/2023 0620   GFRAA 9 (L) 02/19/2020 1316   CrCl cannot be calculated (Patient's most recent lab result is older than the maximum 21 days allowed.).  COAG Lab Results  Component Value Date   INR 1.0 03/24/2023   INR 1.1 11/12/2022   INR 1.0 11/10/2022    Radiology VAS US DUPLEX DIALYSIS ACCESS (AVF, AVG)  Result Date: 06/27/2023 DIALYSIS ACCESS Patient Name:  [name]  Date of Exam:   06/27/2023 Medical Rec #: [id number]      Accession #:    [id number] Date of Birth: [birth year]      Patient Gender: M Patient Age:   52 years Exam Location:  [facility] Procedure:      VAS US DUPLEX DIALYSIS ACCESS (AVF, AVG) Referring Phys: [name] --------------------------------------------------------------------------------  Reason for Exam: Routine follow up. Access Site: Left  Upper Extremity. Access Type: Forearm loop AVG. History: Patient reports recent interventions to his left forearm graft from an          outside office. Performing Technologist: Jamse Mead RT, RDMS, RVT  Examination Guidelines: A complete evaluation includes B-mode imaging, spectral Doppler, color Doppler, and power Doppler as needed of all accessible portions of each vessel. Unilateral testing is considered an integral part of a complete examination. Limited examinations for reoccurring indications may be performed as noted.  Findings: +--------------------+----------+-----------------+-------------------------+ AVG                 PSV (cm/s)Flow Vol (mL/min)        Describe          +--------------------+----------+-----------------+-------------------------+ Native artery inflow   151           773       high brachial bifurcation +--------------------+----------+-----------------+-------------------------+ Arterial anastomosis   664                        partially-occlusive    +--------------------+----------+-----------------+-------------------------+ Prox graft  oz (176.2 kg)   Body mass index is 47.28 kg/m. Gen: WD/WN, NAD Head: [place]/AT, No temporalis wasting.  Ear/Nose/Throat: Hearing grossly intact, nares w/o erythema or drainage Eyes: PER, EOMI, sclera nonicteric.  Neck: Supple, no gross masses or lesions.  No JVD.  Pulmonary:  Good air movement, no audible  wheezing, no use of accessory muscles.  Cardiac: RRR, precordium non-hyperdynamic. Vascular:   Left arm AV graft weak thrill weak bruit left leg is wrapped Vessel Right Left  Radial Palpable Palpable  Brachial Palpable Palpable  Gastrointestinal: soft, non-distended. No guarding/no peritoneal signs.  Musculoskeletal: M/S 5/5 throughout.  No deformity.  Neurologic: CN 2-12 intact. Pain and light touch intact in extremities.  Symmetrical.  Speech is fluent. Motor exam as listed above. Psychiatric: Judgment intact, Mood & affect appropriate for pt's clinical situation. Dermatologic: No rashes or ulcers noted.  No changes consistent with cellulitis.   CBC Lab Results  Component Value Date   WBC 5.7 03/29/2023   HGB 10.9 (L) 03/29/2023   HCT 34.0 (L) 03/29/2023   MCV 93.2 03/29/2023   PLT 151 03/29/2023    BMET    Component Value Date/Time   NA 138 03/29/2023 0620   K 4.1 03/29/2023 0620   CL 99 03/29/2023 0620   CO2 24 03/29/2023 0620   GLUCOSE 124 (H) 03/29/2023 0620   BUN 82 (H) 03/29/2023 0620   CREATININE 13.30 (H) 03/29/2023 0620   CALCIUM 9.5 03/29/2023 0620   GFRNONAA 4 (L) 03/29/2023 0620   GFRAA 9 (L) 02/19/2020 1316   CrCl cannot be calculated (Patient's most recent lab result is older than the maximum 21 days allowed.).  COAG Lab Results  Component Value Date   INR 1.0 03/24/2023   INR 1.1 11/12/2022   INR 1.0 11/10/2022    Radiology VAS US DUPLEX DIALYSIS ACCESS (AVF, AVG)  Result Date: 06/27/2023 DIALYSIS ACCESS Patient Name:  [name]  Date of Exam:   06/27/2023 Medical Rec #: [id number]      Accession #:    [id number] Date of Birth: [birth year]      Patient Gender: M Patient Age:   52 years Exam Location:  [facility] Procedure:      VAS US DUPLEX DIALYSIS ACCESS (AVF, AVG) Referring Phys: [name] --------------------------------------------------------------------------------  Reason for Exam: Routine follow up. Access Site: Left  Upper Extremity. Access Type: Forearm loop AVG. History: Patient reports recent interventions to his left forearm graft from an          outside office. Performing Technologist: Jamse Mead RT, RDMS, RVT  Examination Guidelines: A complete evaluation includes B-mode imaging, spectral Doppler, color Doppler, and power Doppler as needed of all accessible portions of each vessel. Unilateral testing is considered an integral part of a complete examination. Limited examinations for reoccurring indications may be performed as noted.  Findings: +--------------------+----------+-----------------+-------------------------+ AVG                 PSV (cm/s)Flow Vol (mL/min)        Describe          +--------------------+----------+-----------------+-------------------------+ Native artery inflow   151           773       high brachial bifurcation +--------------------+----------+-----------------+-------------------------+ Arterial anastomosis   664                        partially-occlusive    +--------------------+----------+-----------------+-------------------------+ Prox graft  oz (176.2 kg)   Body mass index is 47.28 kg/m. Gen: WD/WN, NAD Head: [place]/AT, No temporalis wasting.  Ear/Nose/Throat: Hearing grossly intact, nares w/o erythema or drainage Eyes: PER, EOMI, sclera nonicteric.  Neck: Supple, no gross masses or lesions.  No JVD.  Pulmonary:  Good air movement, no audible  wheezing, no use of accessory muscles.  Cardiac: RRR, precordium non-hyperdynamic. Vascular:   Left arm AV graft weak thrill weak bruit left leg is wrapped Vessel Right Left  Radial Palpable Palpable  Brachial Palpable Palpable  Gastrointestinal: soft, non-distended. No guarding/no peritoneal signs.  Musculoskeletal: M/S 5/5 throughout.  No deformity.  Neurologic: CN 2-12 intact. Pain and light touch intact in extremities.  Symmetrical.  Speech is fluent. Motor exam as listed above. Psychiatric: Judgment intact, Mood & affect appropriate for pt's clinical situation. Dermatologic: No rashes or ulcers noted.  No changes consistent with cellulitis.   CBC Lab Results  Component Value Date   WBC 5.7 03/29/2023   HGB 10.9 (L) 03/29/2023   HCT 34.0 (L) 03/29/2023   MCV 93.2 03/29/2023   PLT 151 03/29/2023    BMET    Component Value Date/Time   NA 138 03/29/2023 0620   K 4.1 03/29/2023 0620   CL 99 03/29/2023 0620   CO2 24 03/29/2023 0620   GLUCOSE 124 (H) 03/29/2023 0620   BUN 82 (H) 03/29/2023 0620   CREATININE 13.30 (H) 03/29/2023 0620   CALCIUM 9.5 03/29/2023 0620   GFRNONAA 4 (L) 03/29/2023 0620   GFRAA 9 (L) 02/19/2020 1316   CrCl cannot be calculated (Patient's most recent lab result is older than the maximum 21 days allowed.).  COAG Lab Results  Component Value Date   INR 1.0 03/24/2023   INR 1.1 11/12/2022   INR 1.0 11/10/2022    Radiology VAS US DUPLEX DIALYSIS ACCESS (AVF, AVG)  Result Date: 06/27/2023 DIALYSIS ACCESS Patient Name:  [name]  Date of Exam:   06/27/2023 Medical Rec #: [id number]      Accession #:    [id number] Date of Birth: [birth year]      Patient Gender: M Patient Age:   52 years Exam Location:  [facility] Procedure:      VAS US DUPLEX DIALYSIS ACCESS (AVF, AVG) Referring Phys: [name] --------------------------------------------------------------------------------  Reason for Exam: Routine follow up. Access Site: Left  Upper Extremity. Access Type: Forearm loop AVG. History: Patient reports recent interventions to his left forearm graft from an          outside office. Performing Technologist: Jamse Mead RT, RDMS, RVT  Examination Guidelines: A complete evaluation includes B-mode imaging, spectral Doppler, color Doppler, and power Doppler as needed of all accessible portions of each vessel. Unilateral testing is considered an integral part of a complete examination. Limited examinations for reoccurring indications may be performed as noted.  Findings: +--------------------+----------+-----------------+-------------------------+ AVG                 PSV (cm/s)Flow Vol (mL/min)        Describe          +--------------------+----------+-----------------+-------------------------+ Native artery inflow   151           773       high brachial bifurcation +--------------------+----------+-----------------+-------------------------+ Arterial anastomosis   664                        partially-occlusive    +--------------------+----------+-----------------+-------------------------+ Prox graft  MRN : [id number]  [name] is a 52 y.o. ([birth year]) male who presents with chief complaint of check access.  History of Present Illness:   The patient returns to the office for follow up regarding a problem with their dialysis access.   The patient notes a significant increase in problems with dialysis.  It is reported that adequate dialysis is not being achieved.    He notes that he was recently sent to [facility] for a fistulogram at which time they did an angioplasty.  The patient denies hand pain or other symptoms consistent with steal phenomena.  No significant arm swelling.  The patient denies redness or swelling at the access site. The patient denies fever or chills at home or while on dialysis.  No recent shortening of the patient's walking distance or new symptoms consistent with claudication.  No history of rest pain symptoms.   The patient reports that he has a 1-1/2 cm residual wound that things have been going well and he has been steadily improving and hopes to be healed soon  The patient denies amaurosis fugax or recent TIA symptoms. There are no recent neurological changes noted. There is no history of DVT, PE or superficial thrombophlebitis. No recent episodes of angina or shortness of breath documented.   Duplex ultrasound of the AV access shows a patent access.  The previously noted stenosis is significantly increased compared to last study.  Flow volume today is 773 cc/min (previous flow volume was 1312 cc/min)  Current Meds  Medication Sig   ACCU-CHEK GUIDE test strip USE 1 STRIP TOTAL 2 TIMES DAILY FOR 90 DAYS   acetaminophen (TYLENOL) 325 MG tablet Take 2 tablets (650 mg total) by mouth every 6 (six) hours as needed for mild pain (or Fever >/= 101).   allopurinol (ZYLOPRIM) 100 MG tablet Take 100 mg by mouth daily.   amLODipine (NORVASC) 10 MG tablet Take 5 mg by mouth daily as needed (if bp is 190/90 or higher).    apixaban (ELIQUIS) 5 MG  TABS tablet Take 1 tablet (5 mg total) by mouth 2 (two) times daily.   atenolol (TENORMIN) 50 MG tablet Take 25 mg by mouth daily as needed (if bp is 190/90 or higher).    atorvastatin (LIPITOR) 80 MG tablet Take 80 mg by mouth at bedtime.   B Complex-C-Folic Acid (RENA-VITE RX) 1 MG TABS Take 1 tablet by mouth 3 (three) times a week.    calcium acetate (PHOSLO) 667 MG capsule Take 1,334-2,001 mg by mouth See admin instructions. Take 2001 mg by mouth with each meal & take 1334 mg by mouth with each snack.   cinacalcet (SENSIPAR) 30 MG tablet Take 30 mg by mouth daily.   CINNAMON PO Take 500 mg by mouth 2 (two) times daily.    clopidogrel (PLAVIX) 75 MG tablet TAKE 1 TABLET BY MOUTH EVERY DAY   Continuous Glucose Sensor (DEXCOM G7 SENSOR) MISC USE 1 EACH EVERY 10 (TEN) DAYS   Insulin Glargine (LANTUS SOLOSTAR) 100 UNIT/ML Solostar Pen Inject 20 Units into the skin at bedtime.   lidocaine-prilocaine (EMLA) cream Apply 1 application topically as needed (port access).   Melatonin 5 MG CAPS Take 1 capsule by mouth at bedtime as needed.   Multiple Vitamin (MULTIVITAMIN WITH MINERALS) TABS tablet Take 1 tablet by mouth 3 (three) times a week. Men's Multivitamin   Multiple  MRN : [id number]  [name] is a 52 y.o. ([birth year]) male who presents with chief complaint of check access.  History of Present Illness:   The patient returns to the office for follow up regarding a problem with their dialysis access.   The patient notes a significant increase in problems with dialysis.  It is reported that adequate dialysis is not being achieved.    He notes that he was recently sent to [facility] for a fistulogram at which time they did an angioplasty.  The patient denies hand pain or other symptoms consistent with steal phenomena.  No significant arm swelling.  The patient denies redness or swelling at the access site. The patient denies fever or chills at home or while on dialysis.  No recent shortening of the patient's walking distance or new symptoms consistent with claudication.  No history of rest pain symptoms.   The patient reports that he has a 1-1/2 cm residual wound that things have been going well and he has been steadily improving and hopes to be healed soon  The patient denies amaurosis fugax or recent TIA symptoms. There are no recent neurological changes noted. There is no history of DVT, PE or superficial thrombophlebitis. No recent episodes of angina or shortness of breath documented.   Duplex ultrasound of the AV access shows a patent access.  The previously noted stenosis is significantly increased compared to last study.  Flow volume today is 773 cc/min (previous flow volume was 1312 cc/min)  Current Meds  Medication Sig   ACCU-CHEK GUIDE test strip USE 1 STRIP TOTAL 2 TIMES DAILY FOR 90 DAYS   acetaminophen (TYLENOL) 325 MG tablet Take 2 tablets (650 mg total) by mouth every 6 (six) hours as needed for mild pain (or Fever >/= 101).   allopurinol (ZYLOPRIM) 100 MG tablet Take 100 mg by mouth daily.   amLODipine (NORVASC) 10 MG tablet Take 5 mg by mouth daily as needed (if bp is 190/90 or higher).    apixaban (ELIQUIS) 5 MG  TABS tablet Take 1 tablet (5 mg total) by mouth 2 (two) times daily.   atenolol (TENORMIN) 50 MG tablet Take 25 mg by mouth daily as needed (if bp is 190/90 or higher).    atorvastatin (LIPITOR) 80 MG tablet Take 80 mg by mouth at bedtime.   B Complex-C-Folic Acid (RENA-VITE RX) 1 MG TABS Take 1 tablet by mouth 3 (three) times a week.    calcium acetate (PHOSLO) 667 MG capsule Take 1,334-2,001 mg by mouth See admin instructions. Take 2001 mg by mouth with each meal & take 1334 mg by mouth with each snack.   cinacalcet (SENSIPAR) 30 MG tablet Take 30 mg by mouth daily.   CINNAMON PO Take 500 mg by mouth 2 (two) times daily.    clopidogrel (PLAVIX) 75 MG tablet TAKE 1 TABLET BY MOUTH EVERY DAY   Continuous Glucose Sensor (DEXCOM G7 SENSOR) MISC USE 1 EACH EVERY 10 (TEN) DAYS   Insulin Glargine (LANTUS SOLOSTAR) 100 UNIT/ML Solostar Pen Inject 20 Units into the skin at bedtime.   lidocaine-prilocaine (EMLA) cream Apply 1 application topically as needed (port access).   Melatonin 5 MG CAPS Take 1 capsule by mouth at bedtime as needed.   Multiple Vitamin (MULTIVITAMIN WITH MINERALS) TABS tablet Take 1 tablet by mouth 3 (three) times a week. Men's Multivitamin   Multiple

## 2023-06-27 NOTE — Progress Notes (Signed)
oz (176.2 kg)   Body mass index is 47.28 kg/m. Gen: WD/WN, NAD Head: [place]/AT, No temporalis wasting.  Ear/Nose/Throat: Hearing grossly intact, nares w/o erythema or drainage Eyes: PER, EOMI, sclera nonicteric.  Neck: Supple, no gross masses or lesions.  No JVD.  Pulmonary:  Good air movement, no audible  wheezing, no use of accessory muscles.  Cardiac: RRR, precordium non-hyperdynamic. Vascular:   Left arm AV graft weak thrill weak bruit left leg is wrapped Vessel Right Left  Radial Palpable Palpable  Brachial Palpable Palpable  Gastrointestinal: soft, non-distended. No guarding/no peritoneal signs.  Musculoskeletal: M/S 5/5 throughout.  No deformity.  Neurologic: CN 2-12 intact. Pain and light touch intact in extremities.  Symmetrical.  Speech is fluent. Motor exam as listed above. Psychiatric: Judgment intact, Mood & affect appropriate for pt's clinical situation. Dermatologic: No rashes or ulcers noted.  No changes consistent with cellulitis.   CBC Lab Results  Component Value Date   WBC 5.7 03/29/2023   HGB 10.9 (L) 03/29/2023   HCT 34.0 (L) 03/29/2023   MCV 93.2 03/29/2023   PLT 151 03/29/2023    BMET    Component Value Date/Time   NA 138 03/29/2023 0620   K 4.1 03/29/2023 0620   CL 99 03/29/2023 0620   CO2 24 03/29/2023 0620   GLUCOSE 124 (H) 03/29/2023 0620   BUN 82 (H) 03/29/2023 0620   CREATININE 13.30 (H) 03/29/2023 0620   CALCIUM 9.5 03/29/2023 0620   GFRNONAA 4 (L) 03/29/2023 0620   GFRAA 9 (L) 02/19/2020 1316   CrCl cannot be calculated (Patient's most recent lab result is older than the maximum 21 days allowed.).  COAG Lab Results  Component Value Date   INR 1.0 03/24/2023   INR 1.1 11/12/2022   INR 1.0 11/10/2022    Radiology VAS US DUPLEX DIALYSIS ACCESS (AVF, AVG)  Result Date: 06/27/2023 DIALYSIS ACCESS Patient Name:  [name]  Date of Exam:   06/27/2023 Medical Rec #: [id number]      Accession #:    [id number] Date of Birth: [birth year]      Patient Gender: M Patient Age:   52 years Exam Location:  [facility] Procedure:      VAS US DUPLEX DIALYSIS ACCESS (AVF, AVG) Referring Phys: [name] --------------------------------------------------------------------------------  Reason for Exam: Routine follow up. Access Site: Left  Upper Extremity. Access Type: Forearm loop AVG. History: Patient reports recent interventions to his left forearm graft from an          outside office. Performing Technologist: Jamse Mead RT, RDMS, RVT  Examination Guidelines: A complete evaluation includes B-mode imaging, spectral Doppler, color Doppler, and power Doppler as needed of all accessible portions of each vessel. Unilateral testing is considered an integral part of a complete examination. Limited examinations for reoccurring indications may be performed as noted.  Findings: +--------------------+----------+-----------------+-------------------------+ AVG                 PSV (cm/s)Flow Vol (mL/min)        Describe          +--------------------+----------+-----------------+-------------------------+ Native artery inflow   151           773       high brachial bifurcation +--------------------+----------+-----------------+-------------------------+ Arterial anastomosis   664                        partially-occlusive    +--------------------+----------+-----------------+-------------------------+ Prox graft  MRN : [id number]  [name] is a 52 y.o. ([birth year]) male who presents with chief complaint of check access.  History of Present Illness:   The patient returns to the office for follow up regarding a problem with their dialysis access.   The patient notes a significant increase in problems with dialysis.  It is reported that adequate dialysis is not being achieved.    He notes that he was recently sent to [facility] for a fistulogram at which time they did an angioplasty.  The patient denies hand pain or other symptoms consistent with steal phenomena.  No significant arm swelling.  The patient denies redness or swelling at the access site. The patient denies fever or chills at home or while on dialysis.  No recent shortening of the patient's walking distance or new symptoms consistent with claudication.  No history of rest pain symptoms.   The patient reports that he has a 1-1/2 cm residual wound that things have been going well and he has been steadily improving and hopes to be healed soon  The patient denies amaurosis fugax or recent TIA symptoms. There are no recent neurological changes noted. There is no history of DVT, PE or superficial thrombophlebitis. No recent episodes of angina or shortness of breath documented.   Duplex ultrasound of the AV access shows a patent access.  The previously noted stenosis is significantly increased compared to last study.  Flow volume today is 773 cc/min (previous flow volume was 1312 cc/min)  Current Meds  Medication Sig   ACCU-CHEK GUIDE test strip USE 1 STRIP TOTAL 2 TIMES DAILY FOR 90 DAYS   acetaminophen (TYLENOL) 325 MG tablet Take 2 tablets (650 mg total) by mouth every 6 (six) hours as needed for mild pain (or Fever >/= 101).   allopurinol (ZYLOPRIM) 100 MG tablet Take 100 mg by mouth daily.   amLODipine (NORVASC) 10 MG tablet Take 5 mg by mouth daily as needed (if bp is 190/90 or higher).    apixaban (ELIQUIS) 5 MG  TABS tablet Take 1 tablet (5 mg total) by mouth 2 (two) times daily.   atenolol (TENORMIN) 50 MG tablet Take 25 mg by mouth daily as needed (if bp is 190/90 or higher).    atorvastatin (LIPITOR) 80 MG tablet Take 80 mg by mouth at bedtime.   B Complex-C-Folic Acid (RENA-VITE RX) 1 MG TABS Take 1 tablet by mouth 3 (three) times a week.    calcium acetate (PHOSLO) 667 MG capsule Take 1,334-2,001 mg by mouth See admin instructions. Take 2001 mg by mouth with each meal & take 1334 mg by mouth with each snack.   cinacalcet (SENSIPAR) 30 MG tablet Take 30 mg by mouth daily.   CINNAMON PO Take 500 mg by mouth 2 (two) times daily.    clopidogrel (PLAVIX) 75 MG tablet TAKE 1 TABLET BY MOUTH EVERY DAY   Continuous Glucose Sensor (DEXCOM G7 SENSOR) MISC USE 1 EACH EVERY 10 (TEN) DAYS   Insulin Glargine (LANTUS SOLOSTAR) 100 UNIT/ML Solostar Pen Inject 20 Units into the skin at bedtime.   lidocaine-prilocaine (EMLA) cream Apply 1 application topically as needed (port access).   Melatonin 5 MG CAPS Take 1 capsule by mouth at bedtime as needed.   Multiple Vitamin (MULTIVITAMIN WITH MINERALS) TABS tablet Take 1 tablet by mouth 3 (three) times a week. Men's Multivitamin   Multiple  oz (176.2 kg)   Body mass index is 47.28 kg/m. Gen: WD/WN, NAD Head: [place]/AT, No temporalis wasting.  Ear/Nose/Throat: Hearing grossly intact, nares w/o erythema or drainage Eyes: PER, EOMI, sclera nonicteric.  Neck: Supple, no gross masses or lesions.  No JVD.  Pulmonary:  Good air movement, no audible  wheezing, no use of accessory muscles.  Cardiac: RRR, precordium non-hyperdynamic. Vascular:   Left arm AV graft weak thrill weak bruit left leg is wrapped Vessel Right Left  Radial Palpable Palpable  Brachial Palpable Palpable  Gastrointestinal: soft, non-distended. No guarding/no peritoneal signs.  Musculoskeletal: M/S 5/5 throughout.  No deformity.  Neurologic: CN 2-12 intact. Pain and light touch intact in extremities.  Symmetrical.  Speech is fluent. Motor exam as listed above. Psychiatric: Judgment intact, Mood & affect appropriate for pt's clinical situation. Dermatologic: No rashes or ulcers noted.  No changes consistent with cellulitis.   CBC Lab Results  Component Value Date   WBC 5.7 03/29/2023   HGB 10.9 (L) 03/29/2023   HCT 34.0 (L) 03/29/2023   MCV 93.2 03/29/2023   PLT 151 03/29/2023    BMET    Component Value Date/Time   NA 138 03/29/2023 0620   K 4.1 03/29/2023 0620   CL 99 03/29/2023 0620   CO2 24 03/29/2023 0620   GLUCOSE 124 (H) 03/29/2023 0620   BUN 82 (H) 03/29/2023 0620   CREATININE 13.30 (H) 03/29/2023 0620   CALCIUM 9.5 03/29/2023 0620   GFRNONAA 4 (L) 03/29/2023 0620   GFRAA 9 (L) 02/19/2020 1316   CrCl cannot be calculated (Patient's most recent lab result is older than the maximum 21 days allowed.).  COAG Lab Results  Component Value Date   INR 1.0 03/24/2023   INR 1.1 11/12/2022   INR 1.0 11/10/2022    Radiology VAS US DUPLEX DIALYSIS ACCESS (AVF, AVG)  Result Date: 06/27/2023 DIALYSIS ACCESS Patient Name:  [name]  Date of Exam:   06/27/2023 Medical Rec #: [id number]      Accession #:    [id number] Date of Birth: [birth year]      Patient Gender: M Patient Age:   52 years Exam Location:  [facility] Procedure:      VAS US DUPLEX DIALYSIS ACCESS (AVF, AVG) Referring Phys: [name] --------------------------------------------------------------------------------  Reason for Exam: Routine follow up. Access Site: Left  Upper Extremity. Access Type: Forearm loop AVG. History: Patient reports recent interventions to his left forearm graft from an          outside office. Performing Technologist: Jamse Mead RT, RDMS, RVT  Examination Guidelines: A complete evaluation includes B-mode imaging, spectral Doppler, color Doppler, and power Doppler as needed of all accessible portions of each vessel. Unilateral testing is considered an integral part of a complete examination. Limited examinations for reoccurring indications may be performed as noted.  Findings: +--------------------+----------+-----------------+-------------------------+ AVG                 PSV (cm/s)Flow Vol (mL/min)        Describe          +--------------------+----------+-----------------+-------------------------+ Native artery inflow   151           773       high brachial bifurcation +--------------------+----------+-----------------+-------------------------+ Arterial anastomosis   664                        partially-occlusive    +--------------------+----------+-----------------+-------------------------+ Prox graft  oz (176.2 kg)   Body mass index is 47.28 kg/m. Gen: WD/WN, NAD Head: [place]/AT, No temporalis wasting.  Ear/Nose/Throat: Hearing grossly intact, nares w/o erythema or drainage Eyes: PER, EOMI, sclera nonicteric.  Neck: Supple, no gross masses or lesions.  No JVD.  Pulmonary:  Good air movement, no audible  wheezing, no use of accessory muscles.  Cardiac: RRR, precordium non-hyperdynamic. Vascular:   Left arm AV graft weak thrill weak bruit left leg is wrapped Vessel Right Left  Radial Palpable Palpable  Brachial Palpable Palpable  Gastrointestinal: soft, non-distended. No guarding/no peritoneal signs.  Musculoskeletal: M/S 5/5 throughout.  No deformity.  Neurologic: CN 2-12 intact. Pain and light touch intact in extremities.  Symmetrical.  Speech is fluent. Motor exam as listed above. Psychiatric: Judgment intact, Mood & affect appropriate for pt's clinical situation. Dermatologic: No rashes or ulcers noted.  No changes consistent with cellulitis.   CBC Lab Results  Component Value Date   WBC 5.7 03/29/2023   HGB 10.9 (L) 03/29/2023   HCT 34.0 (L) 03/29/2023   MCV 93.2 03/29/2023   PLT 151 03/29/2023    BMET    Component Value Date/Time   NA 138 03/29/2023 0620   K 4.1 03/29/2023 0620   CL 99 03/29/2023 0620   CO2 24 03/29/2023 0620   GLUCOSE 124 (H) 03/29/2023 0620   BUN 82 (H) 03/29/2023 0620   CREATININE 13.30 (H) 03/29/2023 0620   CALCIUM 9.5 03/29/2023 0620   GFRNONAA 4 (L) 03/29/2023 0620   GFRAA 9 (L) 02/19/2020 1316   CrCl cannot be calculated (Patient's most recent lab result is older than the maximum 21 days allowed.).  COAG Lab Results  Component Value Date   INR 1.0 03/24/2023   INR 1.1 11/12/2022   INR 1.0 11/10/2022    Radiology VAS US DUPLEX DIALYSIS ACCESS (AVF, AVG)  Result Date: 06/27/2023 DIALYSIS ACCESS Patient Name:  [name]  Date of Exam:   06/27/2023 Medical Rec #: [id number]      Accession #:    [id number] Date of Birth: [birth year]      Patient Gender: M Patient Age:   52 years Exam Location:  [facility] Procedure:      VAS US DUPLEX DIALYSIS ACCESS (AVF, AVG) Referring Phys: [name] --------------------------------------------------------------------------------  Reason for Exam: Routine follow up. Access Site: Left  Upper Extremity. Access Type: Forearm loop AVG. History: Patient reports recent interventions to his left forearm graft from an          outside office. Performing Technologist: Jamse Mead RT, RDMS, RVT  Examination Guidelines: A complete evaluation includes B-mode imaging, spectral Doppler, color Doppler, and power Doppler as needed of all accessible portions of each vessel. Unilateral testing is considered an integral part of a complete examination. Limited examinations for reoccurring indications may be performed as noted.  Findings: +--------------------+----------+-----------------+-------------------------+ AVG                 PSV (cm/s)Flow Vol (mL/min)        Describe          +--------------------+----------+-----------------+-------------------------+ Native artery inflow   151           773       high brachial bifurcation +--------------------+----------+-----------------+-------------------------+ Arterial anastomosis   664                        partially-occlusive    +--------------------+----------+-----------------+-------------------------+ Prox graft  oz (176.2 kg)   Body mass index is 47.28 kg/m. Gen: WD/WN, NAD Head: [place]/AT, No temporalis wasting.  Ear/Nose/Throat: Hearing grossly intact, nares w/o erythema or drainage Eyes: PER, EOMI, sclera nonicteric.  Neck: Supple, no gross masses or lesions.  No JVD.  Pulmonary:  Good air movement, no audible  wheezing, no use of accessory muscles.  Cardiac: RRR, precordium non-hyperdynamic. Vascular:   Left arm AV graft weak thrill weak bruit left leg is wrapped Vessel Right Left  Radial Palpable Palpable  Brachial Palpable Palpable  Gastrointestinal: soft, non-distended. No guarding/no peritoneal signs.  Musculoskeletal: M/S 5/5 throughout.  No deformity.  Neurologic: CN 2-12 intact. Pain and light touch intact in extremities.  Symmetrical.  Speech is fluent. Motor exam as listed above. Psychiatric: Judgment intact, Mood & affect appropriate for pt's clinical situation. Dermatologic: No rashes or ulcers noted.  No changes consistent with cellulitis.   CBC Lab Results  Component Value Date   WBC 5.7 03/29/2023   HGB 10.9 (L) 03/29/2023   HCT 34.0 (L) 03/29/2023   MCV 93.2 03/29/2023   PLT 151 03/29/2023    BMET    Component Value Date/Time   NA 138 03/29/2023 0620   K 4.1 03/29/2023 0620   CL 99 03/29/2023 0620   CO2 24 03/29/2023 0620   GLUCOSE 124 (H) 03/29/2023 0620   BUN 82 (H) 03/29/2023 0620   CREATININE 13.30 (H) 03/29/2023 0620   CALCIUM 9.5 03/29/2023 0620   GFRNONAA 4 (L) 03/29/2023 0620   GFRAA 9 (L) 02/19/2020 1316   CrCl cannot be calculated (Patient's most recent lab result is older than the maximum 21 days allowed.).  COAG Lab Results  Component Value Date   INR 1.0 03/24/2023   INR 1.1 11/12/2022   INR 1.0 11/10/2022    Radiology VAS US DUPLEX DIALYSIS ACCESS (AVF, AVG)  Result Date: 06/27/2023 DIALYSIS ACCESS Patient Name:  [name]  Date of Exam:   06/27/2023 Medical Rec #: [id number]      Accession #:    [id number] Date of Birth: [birth year]      Patient Gender: M Patient Age:   52 years Exam Location:  [facility] Procedure:      VAS US DUPLEX DIALYSIS ACCESS (AVF, AVG) Referring Phys: [name] --------------------------------------------------------------------------------  Reason for Exam: Routine follow up. Access Site: Left  Upper Extremity. Access Type: Forearm loop AVG. History: Patient reports recent interventions to his left forearm graft from an          outside office. Performing Technologist: Jamse Mead RT, RDMS, RVT  Examination Guidelines: A complete evaluation includes B-mode imaging, spectral Doppler, color Doppler, and power Doppler as needed of all accessible portions of each vessel. Unilateral testing is considered an integral part of a complete examination. Limited examinations for reoccurring indications may be performed as noted.  Findings: +--------------------+----------+-----------------+-------------------------+ AVG                 PSV (cm/s)Flow Vol (mL/min)        Describe          +--------------------+----------+-----------------+-------------------------+ Native artery inflow   151           773       high brachial bifurcation +--------------------+----------+-----------------+-------------------------+ Arterial anastomosis   664                        partially-occlusive    +--------------------+----------+-----------------+-------------------------+ Prox graft  MRN : [id number]  [name] is a 52 y.o. ([birth year]) male who presents with chief complaint of check access.  History of Present Illness:   The patient returns to the office for follow up regarding a problem with their dialysis access.   The patient notes a significant increase in problems with dialysis.  It is reported that adequate dialysis is not being achieved.    He notes that he was recently sent to [facility] for a fistulogram at which time they did an angioplasty.  The patient denies hand pain or other symptoms consistent with steal phenomena.  No significant arm swelling.  The patient denies redness or swelling at the access site. The patient denies fever or chills at home or while on dialysis.  No recent shortening of the patient's walking distance or new symptoms consistent with claudication.  No history of rest pain symptoms.   The patient reports that he has a 1-1/2 cm residual wound that things have been going well and he has been steadily improving and hopes to be healed soon  The patient denies amaurosis fugax or recent TIA symptoms. There are no recent neurological changes noted. There is no history of DVT, PE or superficial thrombophlebitis. No recent episodes of angina or shortness of breath documented.   Duplex ultrasound of the AV access shows a patent access.  The previously noted stenosis is significantly increased compared to last study.  Flow volume today is 773 cc/min (previous flow volume was 1312 cc/min)  Current Meds  Medication Sig   ACCU-CHEK GUIDE test strip USE 1 STRIP TOTAL 2 TIMES DAILY FOR 90 DAYS   acetaminophen (TYLENOL) 325 MG tablet Take 2 tablets (650 mg total) by mouth every 6 (six) hours as needed for mild pain (or Fever >/= 101).   allopurinol (ZYLOPRIM) 100 MG tablet Take 100 mg by mouth daily.   amLODipine (NORVASC) 10 MG tablet Take 5 mg by mouth daily as needed (if bp is 190/90 or higher).    apixaban (ELIQUIS) 5 MG  TABS tablet Take 1 tablet (5 mg total) by mouth 2 (two) times daily.   atenolol (TENORMIN) 50 MG tablet Take 25 mg by mouth daily as needed (if bp is 190/90 or higher).    atorvastatin (LIPITOR) 80 MG tablet Take 80 mg by mouth at bedtime.   B Complex-C-Folic Acid (RENA-VITE RX) 1 MG TABS Take 1 tablet by mouth 3 (three) times a week.    calcium acetate (PHOSLO) 667 MG capsule Take 1,334-2,001 mg by mouth See admin instructions. Take 2001 mg by mouth with each meal & take 1334 mg by mouth with each snack.   cinacalcet (SENSIPAR) 30 MG tablet Take 30 mg by mouth daily.   CINNAMON PO Take 500 mg by mouth 2 (two) times daily.    clopidogrel (PLAVIX) 75 MG tablet TAKE 1 TABLET BY MOUTH EVERY DAY   Continuous Glucose Sensor (DEXCOM G7 SENSOR) MISC USE 1 EACH EVERY 10 (TEN) DAYS   Insulin Glargine (LANTUS SOLOSTAR) 100 UNIT/ML Solostar Pen Inject 20 Units into the skin at bedtime.   lidocaine-prilocaine (EMLA) cream Apply 1 application topically as needed (port access).   Melatonin 5 MG CAPS Take 1 capsule by mouth at bedtime as needed.   Multiple Vitamin (MULTIVITAMIN WITH MINERALS) TABS tablet Take 1 tablet by mouth 3 (three) times a week. Men's Multivitamin   Multiple  MRN : [id number]  [name] is a 52 y.o. ([birth year]) male who presents with chief complaint of check access.  History of Present Illness:   The patient returns to the office for follow up regarding a problem with their dialysis access.   The patient notes a significant increase in problems with dialysis.  It is reported that adequate dialysis is not being achieved.    He notes that he was recently sent to [facility] for a fistulogram at which time they did an angioplasty.  The patient denies hand pain or other symptoms consistent with steal phenomena.  No significant arm swelling.  The patient denies redness or swelling at the access site. The patient denies fever or chills at home or while on dialysis.  No recent shortening of the patient's walking distance or new symptoms consistent with claudication.  No history of rest pain symptoms.   The patient reports that he has a 1-1/2 cm residual wound that things have been going well and he has been steadily improving and hopes to be healed soon  The patient denies amaurosis fugax or recent TIA symptoms. There are no recent neurological changes noted. There is no history of DVT, PE or superficial thrombophlebitis. No recent episodes of angina or shortness of breath documented.   Duplex ultrasound of the AV access shows a patent access.  The previously noted stenosis is significantly increased compared to last study.  Flow volume today is 773 cc/min (previous flow volume was 1312 cc/min)  Current Meds  Medication Sig   ACCU-CHEK GUIDE test strip USE 1 STRIP TOTAL 2 TIMES DAILY FOR 90 DAYS   acetaminophen (TYLENOL) 325 MG tablet Take 2 tablets (650 mg total) by mouth every 6 (six) hours as needed for mild pain (or Fever >/= 101).   allopurinol (ZYLOPRIM) 100 MG tablet Take 100 mg by mouth daily.   amLODipine (NORVASC) 10 MG tablet Take 5 mg by mouth daily as needed (if bp is 190/90 or higher).    apixaban (ELIQUIS) 5 MG  TABS tablet Take 1 tablet (5 mg total) by mouth 2 (two) times daily.   atenolol (TENORMIN) 50 MG tablet Take 25 mg by mouth daily as needed (if bp is 190/90 or higher).    atorvastatin (LIPITOR) 80 MG tablet Take 80 mg by mouth at bedtime.   B Complex-C-Folic Acid (RENA-VITE RX) 1 MG TABS Take 1 tablet by mouth 3 (three) times a week.    calcium acetate (PHOSLO) 667 MG capsule Take 1,334-2,001 mg by mouth See admin instructions. Take 2001 mg by mouth with each meal & take 1334 mg by mouth with each snack.   cinacalcet (SENSIPAR) 30 MG tablet Take 30 mg by mouth daily.   CINNAMON PO Take 500 mg by mouth 2 (two) times daily.    clopidogrel (PLAVIX) 75 MG tablet TAKE 1 TABLET BY MOUTH EVERY DAY   Continuous Glucose Sensor (DEXCOM G7 SENSOR) MISC USE 1 EACH EVERY 10 (TEN) DAYS   Insulin Glargine (LANTUS SOLOSTAR) 100 UNIT/ML Solostar Pen Inject 20 Units into the skin at bedtime.   lidocaine-prilocaine (EMLA) cream Apply 1 application topically as needed (port access).   Melatonin 5 MG CAPS Take 1 capsule by mouth at bedtime as needed.   Multiple Vitamin (MULTIVITAMIN WITH MINERALS) TABS tablet Take 1 tablet by mouth 3 (three) times a week. Men's Multivitamin   Multiple

## 2023-06-27 NOTE — Progress Notes (Signed)
venous hypertension (idiopathic) with ulcer of left lower extremity Lymphedema, not elsewhere classified Chronic venous hypertension (idiopathic) with inflammation of right lower extremity Type 2 diabetes mellitus with other skin ulcer Procedures [name], [name] ([id number]) [id number] Page 10 of 11 Wound #22 Pre-procedure diagnosis of Wound #22 is [name] Diabetic Wound/Ulcer of the Lower Extremity located on the Left Lower Leg . There was [name] Double Layer Compression Therapy Procedure by [name] Pax, RN. Post procedure Diagnosis Wound #22: Same as Pre-Procedure Plan Follow-up Appointments: Return Appointment in 1 week. Home Health: Home Health Company: - [facility] for wound care. May utilize formulary equivalent dressing for wound treatment orders unless otherwise specified. Home Health Nurse may visit PRN to address patients wound care needs. [name] [phone] Change on Monday and Friday. Wound Care Center will change on Wednesday Scheduled days for dressing changes to be completed; exception, patient has scheduled wound care visit that day. **Please direct any NON-WOUND related issues/requests for orders to patient's Primary Care Physician. **If current dressing causes regression in wound condition, may D/C ordered dressing product/s and apply Normal Saline Moist  Dressing daily until next Wound Healing Center or Other MD appointment. **Notify Wound Healing Center of regression in wound condition at [phone]. Bathing/ Shower/ Hygiene: May shower with wound dressing protected with water repellent cover or cast protector. No tub bath. Anesthetic (Use 'Patient Medications' Section for Anesthetic Order Entry): Lidocaine applied to wound bed Edema Control - Lymphedema / Segmental Compressive Device / Other: Optional: One layer of unna paste to top of compression wrap (to act as an anchor). Tubigrip double layer applied - right leg Elevate, Exercise Daily and Avoid Standing for Long Periods of Time. Elevate legs to the level of the heart and pump ankles as often as possible Elevate leg(s) parallel to the floor when sitting. WOUND #22: - Lower Leg Wound Laterality: Left Cleanser: Soap and Water 3 x Per Week/30 Days Discharge Instructions: Gently cleanse wound with antibacterial soap, rinse and pat dry prior to dressing wounds Cleanser: Wound Cleanser 3 x Per Week/30 Days Discharge Instructions: Wash your hands with soap and water. Remove old dressing, discard into plastic bag and place into trash. Cleanse the wound with Wound Cleanser prior to applying [name] clean dressing using gauze sponges, not tissues or cotton balls. Do not scrub or use excessive force. Pat dry using gauze sponges, not tissue or cotton balls. Prim Dressing: Hydrofera Blue Ready Transfer Foam, 2.5x2.5 (in/in) 3 x Per Week/30 Days ary Discharge Instructions: Apply Hydrofera Blue Ready to wound bed as directed Secondary Dressing: Zetuvit Plus 4x4 (in/in) 3 x Per Week/30 Days Secured With: D[employer] Food Dressing, Latex-free, Size 5, Small-Head / Shoulder / Thigh 3 x Per Week/30 Days Discharge Instructions: over wrap for comfort Com pression Wrap: Urgo K2, two layer compression system, large 3 x Per Week/30 Days 1. No change the dressing Hydrofera Blue ABDs Urgo K2 2. Continue with the  compression pumps 3. The patient has some form of external compression stockingo Juxta lites. I have asked him to put them in his trunk for next week in case this has closed Electronic Signature(s) Signed: 06/23/2023 3:33:32 PM By: [name] Najjar MD Entered By: [name] on 06/22/2023 05:46:02 -------------------------------------------------------------------------------- SuperBill Details Patient Name: Date of Service: [name], [name] 06/22/2023 Medical Record Number: [id number] Patient Account Number: [id number] Date of Birth/Sex: Treating RN: 03/15/1971 (52 y.o. [name] Primary Care Provider: Maudie [name] Other Clinician: Referring Provider: Treating Provider/Extender: RO BSO N,  Cleanse the wound with Wound Cleanser prior to applying [name] clean dressing using gauze sponges, not tissues or cotton balls. Do not scrub or use excessive force. Pat dry using gauze sponges, not tissue or cotton balls. Prim Dressing: Hydrofera Blue Ready Transfer Foam, 2.5x2.5 (in/in) 3 x Per Week/30 Days ary Discharge Instructions: Apply Hydrofera Blue Ready to wound bed as directed Secondary Dressing: Zetuvit Plus 4x4 (in/in) 3 x Per Week/30 Days Secured With: [employer] Food Dressing, Latex-free, Size 5, Small-Head / Shoulder / Thigh 3 x Per Week/30 Days Discharge Instructions: over wrap for comfort Compression Wrap: Urgo K2, two layer compression system, large 3 x Per Week/30 Days Electronic Signature(s) Signed: 06/23/2023 3:33:32 PM By: [name] Najjar MD Signed: 06/27/2023 8:00:25 AM By: [name] Pax RN Entered By: [name] on 06/22/2023 05:38:56 -------------------------------------------------------------------------------- Problem List Details Patient Name: Date of Service: [name], [name] 06/22/2023 8:00 [name] Medical Record Number: [id number] Patient Account Number: [id number] Date of Birth/Sex: Treating RN: [birth year] (52 y.o. [name] Primary Care Provider: Maudie [name] Other Clinician: Referring Provider: Treating Provider/Extender: RO BSO N, [name] in Treatment: 18 Active Problems ICD-10 Encounter Code Description Active Date MDM Diagnosis L97.822 Non-pressure chronic ulcer of other part of left lower leg with fat layer exposed6/19/2024 No Yes [name], [name] ([id number]) [id number] Page  6 of 11 I87.312 Chronic venous hypertension (idiopathic) with ulcer of left lower extremity 02/16/2023 No Yes I89.0 Lymphedema, not elsewhere classified 03/23/2023 No Yes I87.321 Chronic venous hypertension (idiopathic) with inflammation of right lower 02/16/2023 No Yes extremity E11.622 Type 2 diabetes mellitus with other skin ulcer 02/16/2023 No Yes Inactive Problems ICD-10 Code Description Active Date Inactive Date L97.522 Non-pressure chronic ulcer of other part of left foot with fat layer exposed 03/16/2023 03/16/2023 Resolved Problems Electronic Signature(s) Signed: 06/23/2023 3:33:32 PM By: [name] Najjar MD Entered By: [name] on 06/22/2023 05:43:01 -------------------------------------------------------------------------------- Progress Note Details Patient Name: Date of Service: [name], [name] 06/22/2023 8:00 [name] Medical Record Number: [id number] Patient Account Number: [id number] Date of Birth/Sex: Treating RN: [birth year] (52 y.o. [name] Primary Care Provider: Maudie [name] Other Clinician: Referring Provider: Treating Provider/Extender: RO BSO N, [name] in Treatment: 18 Subjective History of Present Illness (HPI) 12/28/17; this is [name] now 52 year old man who is [name] type II diabetic. He was hospitalized from 10/01/17 through 10/19/17. He had an MSSA soft tissue and skin infection. 2 open areas on the left leg were identified he has [name] smaller area on the left medial calf superiorly just below the knee and [name] wound just above the left ankle on the posterior medial aspect. I think both of these were surgical IandD sites when he was in the hospital. He was discharged with [name] wound VAC at that point however this is since been taken off. He follows with Dr. Sampson Goon for the [facility] and he is still on chronic Keflex at 500 twice [name] day. At that time he was hospitalized his hemoglobin A1c was 15.1 however if I'm reading his endocrinologist notes  correctly that is improved. He has been [facility] and he has been applying calcium alginate and Unna boots. He has home health changing the dressing. They have also been attempting to get him external compression pumps although the patient is unaware whether they've been approved by insurance at this point. as mentioned he has [name] smaller clean wound on the right lateral  Cleanse the wound with Wound Cleanser prior to applying [name] clean dressing using gauze sponges, not tissues or cotton balls. Do not scrub or use excessive force. Pat dry using gauze sponges, not tissue or cotton balls. Prim Dressing: Hydrofera Blue Ready Transfer Foam, 2.5x2.5 (in/in) 3 x Per Week/30 Days ary Discharge Instructions: Apply Hydrofera Blue Ready to wound bed as directed Secondary Dressing: Zetuvit Plus 4x4 (in/in) 3 x Per Week/30 Days Secured With: [employer] Food Dressing, Latex-free, Size 5, Small-Head / Shoulder / Thigh 3 x Per Week/30 Days Discharge Instructions: over wrap for comfort Compression Wrap: Urgo K2, two layer compression system, large 3 x Per Week/30 Days Electronic Signature(s) Signed: 06/23/2023 3:33:32 PM By: [name] Najjar MD Signed: 06/27/2023 8:00:25 AM By: [name] Pax RN Entered By: [name] on 06/22/2023 05:38:56 -------------------------------------------------------------------------------- Problem List Details Patient Name: Date of Service: [name], [name] 06/22/2023 8:00 [name] Medical Record Number: [id number] Patient Account Number: [id number] Date of Birth/Sex: Treating RN: [birth year] (52 y.o. [name] Primary Care Provider: Maudie [name] Other Clinician: Referring Provider: Treating Provider/Extender: RO BSO N, [name] in Treatment: 18 Active Problems ICD-10 Encounter Code Description Active Date MDM Diagnosis L97.822 Non-pressure chronic ulcer of other part of left lower leg with fat layer exposed6/19/2024 No Yes [name], [name] ([id number]) [id number] Page  6 of 11 I87.312 Chronic venous hypertension (idiopathic) with ulcer of left lower extremity 02/16/2023 No Yes I89.0 Lymphedema, not elsewhere classified 03/23/2023 No Yes I87.321 Chronic venous hypertension (idiopathic) with inflammation of right lower 02/16/2023 No Yes extremity E11.622 Type 2 diabetes mellitus with other skin ulcer 02/16/2023 No Yes Inactive Problems ICD-10 Code Description Active Date Inactive Date L97.522 Non-pressure chronic ulcer of other part of left foot with fat layer exposed 03/16/2023 03/16/2023 Resolved Problems Electronic Signature(s) Signed: 06/23/2023 3:33:32 PM By: [name] Najjar MD Entered By: [name] on 06/22/2023 05:43:01 -------------------------------------------------------------------------------- Progress Note Details Patient Name: Date of Service: [name], [name] 06/22/2023 8:00 [name] Medical Record Number: [id number] Patient Account Number: [id number] Date of Birth/Sex: Treating RN: [birth year] (52 y.o. [name] Primary Care Provider: Maudie [name] Other Clinician: Referring Provider: Treating Provider/Extender: RO BSO N, [name] in Treatment: 18 Subjective History of Present Illness (HPI) 12/28/17; this is [name] now 52 year old man who is [name] type II diabetic. He was hospitalized from 10/01/17 through 10/19/17. He had an MSSA soft tissue and skin infection. 2 open areas on the left leg were identified he has [name] smaller area on the left medial calf superiorly just below the knee and [name] wound just above the left ankle on the posterior medial aspect. I think both of these were surgical IandD sites when he was in the hospital. He was discharged with [name] wound VAC at that point however this is since been taken off. He follows with Dr. Sampson Goon for the [facility] and he is still on chronic Keflex at 500 twice [name] day. At that time he was hospitalized his hemoglobin A1c was 15.1 however if I'm reading his endocrinologist notes  correctly that is improved. He has been following with [facility] and he has been applying calcium alginate and Unna boots. He has home health changing the dressing. They have also been attempting to get him external compression pumps although the patient is unaware whether they've been approved by insurance at this point. 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 The wound measures smaller today. Noticeable improvement in the [name], [name] ([id number]) [id number].pdf Page 4 of 11 edema he tells me he is using his external compression pumps once [name] day at least sometimes twice. 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 06/22/2023 8:00 [name] Medical Record Number: [id number] Patient Account Number: [id number] Date of Birth/Sex: Treating RN: [birth year] (52 y.o. [name] Primary Care Provider: Maudie [name] Other Clinician: Referring Provider: Treating Provider/Extender: RO BSO N, [name] in Treatment: 18 Constitutional Sitting or standing Blood Pressure is within target range for patient.. Pulse regular and within target range for patient.[name] Respirations regular, non-labored and within target range.. Temperature is normal and within the target range for the patient.[name] appears in no distress. Notes Wound exam; circular wound in the left mid anterior lower leg. Edema control today is much better noticeable improvement. No evidence of surrounding infection. Pedal pulses palpable Electronic Signature(s) Signed: 06/23/2023 3:33:32 PM By: [name] Najjar MD Entered By: [name] on 06/22/2023 05:45:17 -------------------------------------------------------------------------------- Physician Orders Details Patient Name: Date of Service: [name], [name] 06/22/2023 8:00 [name] Medical Record Number: [id number] Patient Account Number: [id number] Date of Birth/Sex:  Treating RN: September 27, 1970 (52 y.o. [name] Primary Care Provider: Maudie [name] Other Clinician: Referring Provider: Treating Provider/Extender: RO BSO [name], [name] in Treatment: [phone] Verbal / Phone Orders: No Diagnosis Coding Follow-up Appointments Return Appointment in 1 week. Home Health Home Health Company: - [facility] for wound care. May utilize formulary equivalent dressing for wound treatment orders unless otherwise specified. Home Health Nurse may visit PRN to address patients wound care needs. [name] [phone] Change on Monday and Friday. Wound Care Center will change on Wednesday [name], [name] ([id number]) [id number] Page 5 of 11 Scheduled days for dressing changes to be completed; exception, patient has scheduled wound care visit that day. **Please direct any NON-WOUND related issues/requests for orders to patient's Primary Care Physician. **If current dressing causes regression in wound condition, may D/C ordered dressing product/s and apply Normal Saline Moist Dressing daily until next Wound Healing Center or Other MD appointment. **Notify Wound Healing Center of regression in wound condition at [phone]. Bathing/ Shower/ Hygiene May shower with wound dressing protected with water repellent cover or cast protector. No tub bath. Anesthetic (Use 'Patient Medications' Section for Anesthetic Order Entry) Lidocaine applied to wound bed Edema Control - Lymphedema / Segmental Compressive Device / Other Optional: One layer of unna paste to top of compression wrap (to act as an anchor). Tubigrip double layer applied - right leg Elevate, Exercise Daily and [name] void Standing for Long Periods of Time. Elevate legs to the level of the heart and pump ankles as often as possible Elevate leg(s) parallel to the floor when sitting. Wound Treatment Wound #22 - Lower Leg Wound Laterality: Left Cleanser:  Soap and Water 3 x Per Week/30 Days Discharge Instructions: Gently cleanse wound with antibacterial soap, rinse and pat dry prior to dressing wounds Cleanser: Wound Cleanser 3 x Per Week/30 Days Discharge Instructions: Wash your hands with soap and water. Remove old dressing, discard into plastic bag and place into trash.  works nights 10/23; Hydrofera Blue under Urgo K2 compression for [name] wound on his left medial lower leg. The wound measures smaller today. Noticeable improvement in the [name], [name] ([id number]) [id number].pdf Page 4 of 11 edema he tells me he is using his external compression pumps once [name] day at least sometimes twice. 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 06/22/2023 8:00 [name] Medical Record Number: [id number] Patient Account Number: [id number] Date of Birth/Sex: Treating RN: [birth year] (52 y.o. [name] Primary Care Provider: Maudie [name] Other Clinician: Referring Provider: Treating Provider/Extender: RO BSO N, [name] in Treatment: 18 Constitutional Sitting or standing Blood Pressure is within target range for patient.. Pulse regular and within target range for patient.[name] Respirations regular, non-labored and within target range.. Temperature is normal and within the target range for the patient.[name] appears in no distress. Notes Wound exam; circular wound in the left mid anterior lower leg. Edema control today is much better noticeable improvement. No evidence of surrounding infection. Pedal pulses palpable Electronic Signature(s) Signed: 06/23/2023 3:33:32 PM By: [name] Najjar MD Entered By: [name] on 06/22/2023 05:45:17 -------------------------------------------------------------------------------- Physician Orders Details Patient Name: Date of Service: [name], [name] 06/22/2023 8:00 [name] Medical Record Number: [id number] Patient Account Number: [id number] Date of Birth/Sex:  Treating RN: September 27, 1970 (52 y.o. [name] Primary Care Provider: Maudie [name] Other Clinician: Referring Provider: Treating Provider/Extender: RO BSO [name], [name] in Treatment: [phone] Verbal / Phone Orders: No Diagnosis Coding Follow-up Appointments Return Appointment in 1 week. Home Health Home Health Company: - [facility] for wound care. May utilize formulary equivalent dressing for wound treatment orders unless otherwise specified. Home Health Nurse may visit PRN to address patients wound care needs. [name] [phone] Change on Monday and Friday. Wound Care Center will change on Wednesday [name], [name] ([id number]) [id number] Page 5 of 11 Scheduled days for dressing changes to be completed; exception, patient has scheduled wound care visit that day. **Please direct any NON-WOUND related issues/requests for orders to patient's Primary Care Physician. **If current dressing causes regression in wound condition, may D/C ordered dressing product/s and apply Normal Saline Moist Dressing daily until next Wound Healing Center or Other MD appointment. **Notify Wound Healing Center of regression in wound condition at [phone]. Bathing/ Shower/ Hygiene May shower with wound dressing protected with water repellent cover or cast protector. No tub bath. Anesthetic (Use 'Patient Medications' Section for Anesthetic Order Entry) Lidocaine applied to wound bed Edema Control - Lymphedema / Segmental Compressive Device / Other Optional: One layer of unna paste to top of compression wrap (to act as an anchor). Tubigrip double layer applied - right leg Elevate, Exercise Daily and [name] void Standing for Long Periods of Time. Elevate legs to the level of the heart and pump ankles as often as possible Elevate leg(s) parallel to the floor when sitting. Wound Treatment Wound #22 - Lower Leg Wound Laterality: Left Cleanser:  Soap and Water 3 x Per Week/30 Days Discharge Instructions: Gently cleanse wound with antibacterial soap, rinse and pat dry prior to dressing wounds Cleanser: Wound Cleanser 3 x Per Week/30 Days Discharge Instructions: Wash your hands with soap and water. Remove old dressing, discard into plastic bag and place into trash.  venous hypertension (idiopathic) with ulcer of left lower extremity Lymphedema, not elsewhere classified Chronic venous hypertension (idiopathic) with inflammation of right lower extremity Type 2 diabetes mellitus with other skin ulcer Procedures [name], [name] ([id number]) [id number] Page 10 of 11 Wound #22 Pre-procedure diagnosis of Wound #22 is [name] Diabetic Wound/Ulcer of the Lower Extremity located on the Left Lower Leg . There was [name] Double Layer Compression Therapy Procedure by [name] Pax, RN. Post procedure Diagnosis Wound #22: Same as Pre-Procedure Plan Follow-up Appointments: Return Appointment in 1 week. Home Health: Home Health Company: - [facility] for wound care. May utilize formulary equivalent dressing for wound treatment orders unless otherwise specified. Home Health Nurse may visit PRN to address patients wound care needs. [name] [phone] Change on Monday and Friday. Wound Care Center will change on Wednesday Scheduled days for dressing changes to be completed; exception, patient has scheduled wound care visit that day. **Please direct any NON-WOUND related issues/requests for orders to patient's Primary Care Physician. **If current dressing causes regression in wound condition, may D/C ordered dressing product/s and apply Normal Saline Moist  Dressing daily until next Wound Healing Center or Other MD appointment. **Notify Wound Healing Center of regression in wound condition at [phone]. Bathing/ Shower/ Hygiene: May shower with wound dressing protected with water repellent cover or cast protector. No tub bath. Anesthetic (Use 'Patient Medications' Section for Anesthetic Order Entry): Lidocaine applied to wound bed Edema Control - Lymphedema / Segmental Compressive Device / Other: Optional: One layer of unna paste to top of compression wrap (to act as an anchor). Tubigrip double layer applied - right leg Elevate, Exercise Daily and Avoid Standing for Long Periods of Time. Elevate legs to the level of the heart and pump ankles as often as possible Elevate leg(s) parallel to the floor when sitting. WOUND #22: - Lower Leg Wound Laterality: Left Cleanser: Soap and Water 3 x Per Week/30 Days Discharge Instructions: Gently cleanse wound with antibacterial soap, rinse and pat dry prior to dressing wounds Cleanser: Wound Cleanser 3 x Per Week/30 Days Discharge Instructions: Wash your hands with soap and water. Remove old dressing, discard into plastic bag and place into trash. Cleanse the wound with Wound Cleanser prior to applying [name] clean dressing using gauze sponges, not tissues or cotton balls. Do not scrub or use excessive force. Pat dry using gauze sponges, not tissue or cotton balls. Prim Dressing: Hydrofera Blue Ready Transfer Foam, 2.5x2.5 (in/in) 3 x Per Week/30 Days ary Discharge Instructions: Apply Hydrofera Blue Ready to wound bed as directed Secondary Dressing: Zetuvit Plus 4x4 (in/in) 3 x Per Week/30 Days Secured With: [employer] Food Dressing, Latex-free, Size 5, Small-Head / Shoulder / Thigh 3 x Per Week/30 Days Discharge Instructions: over wrap for comfort Com pression Wrap: Urgo K2, two layer compression system, large 3 x Per Week/30 Days 1. No change the dressing Hydrofera Blue ABDs Urgo K2 2. Continue with the  compression pumps 3. The patient has some form of external compression stockingo Juxta lites. I have asked him to put them in his trunk for next week in case this has closed Electronic Signature(s) Signed: 06/23/2023 3:33:32 PM By: [name] Najjar MD Entered By: [name] on 06/22/2023 05:46:02 -------------------------------------------------------------------------------- SuperBill Details Patient Name: Date of Service: [name], [name] 06/22/2023 Medical Record Number: [id number] Patient Account Number: [id number] Date of Birth/Sex: Treating RN: 03/15/1971 (52 y.o. [name] Primary Care Provider: Maudie [name] Other Clinician: Referring Provider: Treating Provider/Extender: RO BSO N,  Cleanse the wound with Wound Cleanser prior to applying [name] clean dressing using gauze sponges, not tissues or cotton balls. Do not scrub or use excessive force. Pat dry using gauze sponges, not tissue or cotton balls. Prim Dressing: Hydrofera Blue Ready Transfer Foam, 2.5x2.5 (in/in) 3 x Per Week/30 Days ary Discharge Instructions: Apply Hydrofera Blue Ready to wound bed as directed Secondary Dressing: Zetuvit Plus 4x4 (in/in) 3 x Per Week/30 Days Secured With: D[employer] Food Dressing, Latex-free, Size 5, Small-Head / Shoulder / Thigh 3 x Per Week/30 Days Discharge Instructions: over wrap for comfort Compression Wrap: Urgo K2, two layer compression system, large 3 x Per Week/30 Days Electronic Signature(s) Signed: 06/23/2023 3:33:32 PM By: [name] Najjar MD Signed: 06/27/2023 8:00:25 AM By: [name] Pax RN Entered By: [name] on 06/22/2023 05:38:56 -------------------------------------------------------------------------------- Problem List Details Patient Name: Date of Service: [name], [name] 06/22/2023 8:00 [name] Medical Record Number: [id number] Patient Account Number: [id number] Date of Birth/Sex: Treating RN: [birth year] (52 y.o. [name] Primary Care Provider: Maudie [name] Other Clinician: Referring Provider: Treating Provider/Extender: RO BSO N, [name] in Treatment: 18 Active Problems ICD-10 Encounter Code Description Active Date MDM Diagnosis L97.822 Non-pressure chronic ulcer of other part of left lower leg with fat layer exposed6/19/2024 No Yes [name], [name] ([id number]) [id number] Page  6 of 11 I87.312 Chronic venous hypertension (idiopathic) with ulcer of left lower extremity 02/16/2023 No Yes I89.0 Lymphedema, not elsewhere classified 03/23/2023 No Yes I87.321 Chronic venous hypertension (idiopathic) with inflammation of right lower 02/16/2023 No Yes extremity E11.622 Type 2 diabetes mellitus with other skin ulcer 02/16/2023 No Yes Inactive Problems ICD-10 Code Description Active Date Inactive Date L97.522 Non-pressure chronic ulcer of other part of left foot with fat layer exposed 03/16/2023 03/16/2023 Resolved Problems Electronic Signature(s) Signed: 06/23/2023 3:33:32 PM By: [name] Najjar MD Entered By: [name] on 06/22/2023 05:43:01 -------------------------------------------------------------------------------- Progress Note Details Patient Name: Date of Service: [name], [name] 06/22/2023 8:00 [name] Medical Record Number: [id number] Patient Account Number: [id number] Date of Birth/Sex: Treating RN: [birth year] (52 y.o. [name] Primary Care Provider: Maudie [name] Other Clinician: Referring Provider: Treating Provider/Extender: RO BSO N, [name] in Treatment: 18 Subjective History of Present Illness (HPI) 12/28/17; this is [name] now 52 year old man who is [name] type II diabetic. He was hospitalized from 10/01/17 through 10/19/17. He had an MSSA soft tissue and skin infection. 2 open areas on the left leg were identified he has [name] smaller area on the left medial calf superiorly just below the knee and [name] wound just above the left ankle on the posterior medial aspect. I think both of these were surgical IandD sites when he was in the hospital. He was discharged with [name] wound VAC at that point however this is since been taken off. He follows with Dr. Sampson Goon for the [facility] and he is still on chronic Keflex at 500 twice [name] day. At that time he was hospitalized his hemoglobin A1c was 15.1 however if I'm reading his endocrinologist notes  correctly that is improved. He has been following with Dr[facility]ta Harp [facility] and he has been applying calcium alginate and Unna boots. He has home health changing the dressing. They have also been attempting to get him external compression pumps although the patient is unaware whether they've been approved by insurance at this point. as mentioned he has [name] smaller clean wound on the right lateral  venous hypertension (idiopathic) with ulcer of left lower extremity Lymphedema, not elsewhere classified Chronic venous hypertension (idiopathic) with inflammation of right lower extremity Type 2 diabetes mellitus with other skin ulcer Procedures [name], [name] ([id number]) [id number] Page 10 of 11 Wound #22 Pre-procedure diagnosis of Wound #22 is [name] Diabetic Wound/Ulcer of the Lower Extremity located on the Left Lower Leg . There was [name] Double Layer Compression Therapy Procedure by [name] Pax, RN. Post procedure Diagnosis Wound #22: Same as Pre-Procedure Plan Follow-up Appointments: Return Appointment in 1 week. Home Health: Home Health Company: - [facility] for wound care. May utilize formulary equivalent dressing for wound treatment orders unless otherwise specified. Home Health Nurse may visit PRN to address patients wound care needs. [name] [phone] Change on Monday and Friday. Wound Care Center will change on Wednesday Scheduled days for dressing changes to be completed; exception, patient has scheduled wound care visit that day. **Please direct any NON-WOUND related issues/requests for orders to patient's Primary Care Physician. **If current dressing causes regression in wound condition, may D/C ordered dressing product/s and apply Normal Saline Moist  Dressing daily until next Wound Healing Center or Other MD appointment. **Notify Wound Healing Center of regression in wound condition at [phone]. Bathing/ Shower/ Hygiene: May shower with wound dressing protected with water repellent cover or cast protector. No tub bath. Anesthetic (Use 'Patient Medications' Section for Anesthetic Order Entry): Lidocaine applied to wound bed Edema Control - Lymphedema / Segmental Compressive Device / Other: Optional: One layer of unna paste to top of compression wrap (to act as an anchor). Tubigrip double layer applied - right leg Elevate, Exercise Daily and Avoid Standing for Long Periods of Time. Elevate legs to the level of the heart and pump ankles as often as possible Elevate leg(s) parallel to the floor when sitting. WOUND #22: - Lower Leg Wound Laterality: Left Cleanser: Soap and Water 3 x Per Week/30 Days Discharge Instructions: Gently cleanse wound with antibacterial soap, rinse and pat dry prior to dressing wounds Cleanser: Wound Cleanser 3 x Per Week/30 Days Discharge Instructions: Wash your hands with soap and water. Remove old dressing, discard into plastic bag and place into trash. Cleanse the wound with Wound Cleanser prior to applying [name] clean dressing using gauze sponges, not tissues or cotton balls. Do not scrub or use excessive force. Pat dry using gauze sponges, not tissue or cotton balls. Prim Dressing: Hydrofera Blue Ready Transfer Foam, 2.5x2.5 (in/in) 3 x Per Week/30 Days ary Discharge Instructions: Apply Hydrofera Blue Ready to wound bed as directed Secondary Dressing: Zetuvit Plus 4x4 (in/in) 3 x Per Week/30 Days Secured With: [employer] Food Dressing, Latex-free, Size 5, Small-Head / Shoulder / Thigh 3 x Per Week/30 Days Discharge Instructions: over wrap for comfort Com pression Wrap: Urgo K2, two layer compression system, large 3 x Per Week/30 Days 1. No change the dressing Hydrofera Blue ABDs Urgo K2 2. Continue with the  compression pumps 3. The patient has some form of external compression stockingo Juxta lites. I have asked him to put them in his trunk for next week in case this has closed Electronic Signature(s) Signed: 06/23/2023 3:33:32 PM By: [name] Najjar MD Entered By: [name] on 06/22/2023 05:46:02 -------------------------------------------------------------------------------- SuperBill Details Patient Name: Date of Service: [name], [name] 06/22/2023 Medical Record Number: [id number] Patient Account Number: [id number] Date of Birth/Sex: Treating RN: 03/15/1971 (52 y.o. [name] Primary Care Provider: Maudie [name] Other Clinician: Referring Provider: Treating Provider/Extender: RO BSO N,  venous hypertension (idiopathic) with ulcer of left lower extremity Lymphedema, not elsewhere classified Chronic venous hypertension (idiopathic) with inflammation of right lower extremity Type 2 diabetes mellitus with other skin ulcer Procedures [name], [name] ([id number]) [id number] Page 10 of 11 Wound #22 Pre-procedure diagnosis of Wound #22 is [name] Diabetic Wound/Ulcer of the Lower Extremity located on the Left Lower Leg . There was [name] Double Layer Compression Therapy Procedure by [name] Pax, RN. Post procedure Diagnosis Wound #22: Same as Pre-Procedure Plan Follow-up Appointments: Return Appointment in 1 week. Home Health: Home Health Company: - [facility] for wound care. May utilize formulary equivalent dressing for wound treatment orders unless otherwise specified. Home Health Nurse may visit PRN to address patients wound care needs. [name] [phone] Change on Monday and Friday. Wound Care Center will change on Wednesday Scheduled days for dressing changes to be completed; exception, patient has scheduled wound care visit that day. **Please direct any NON-WOUND related issues/requests for orders to patient's Primary Care Physician. **If current dressing causes regression in wound condition, may D/C ordered dressing product/s and apply Normal Saline Moist  Dressing daily until next Wound Healing Center or Other MD appointment. **Notify Wound Healing Center of regression in wound condition at [phone]. Bathing/ Shower/ Hygiene: May shower with wound dressing protected with water repellent cover or cast protector. No tub bath. Anesthetic (Use 'Patient Medications' Section for Anesthetic Order Entry): Lidocaine applied to wound bed Edema Control - Lymphedema / Segmental Compressive Device / Other: Optional: One layer of unna paste to top of compression wrap (to act as an anchor). Tubigrip double layer applied - right leg Elevate, Exercise Daily and Avoid Standing for Long Periods of Time. Elevate legs to the level of the heart and pump ankles as often as possible Elevate leg(s) parallel to the floor when sitting. WOUND #22: - Lower Leg Wound Laterality: Left Cleanser: Soap and Water 3 x Per Week/30 Days Discharge Instructions: Gently cleanse wound with antibacterial soap, rinse and pat dry prior to dressing wounds Cleanser: Wound Cleanser 3 x Per Week/30 Days Discharge Instructions: Wash your hands with soap and water. Remove old dressing, discard into plastic bag and place into trash. Cleanse the wound with Wound Cleanser prior to applying [name] clean dressing using gauze sponges, not tissues or cotton balls. Do not scrub or use excessive force. Pat dry using gauze sponges, not tissue or cotton balls. Prim Dressing: Hydrofera Blue Ready Transfer Foam, 2.5x2.5 (in/in) 3 x Per Week/30 Days ary Discharge Instructions: Apply Hydrofera Blue Ready to wound bed as directed Secondary Dressing: Zetuvit Plus 4x4 (in/in) 3 x Per Week/30 Days Secured With: [employer] Food Dressing, Latex-free, Size 5, Small-Head / Shoulder / Thigh 3 x Per Week/30 Days Discharge Instructions: over wrap for comfort Com pression Wrap: Urgo K2, two layer compression system, large 3 x Per Week/30 Days 1. No change the dressing Hydrofera Blue ABDs Urgo K2 2. Continue with the  compression pumps 3. The patient has some form of external compression stockingo Juxta lites. I have asked him to put them in his trunk for next week in case this has closed Electronic Signature(s) Signed: 06/23/2023 3:33:32 PM By: [name] Najjar MD Entered By: [name] on 06/22/2023 05:46:02 -------------------------------------------------------------------------------- SuperBill Details Patient Name: Date of Service: [name], [name] 06/22/2023 Medical Record Number: [id number] Patient Account Number: [id number] Date of Birth/Sex: Treating RN: 03/15/1971 (52 y.o. [name] Primary Care Provider: Maudie [name] Other Clinician: Referring Provider: Treating Provider/Extender: RO BSO N,  Cleanse the wound with Wound Cleanser prior to applying [name] clean dressing using gauze sponges, not tissues or cotton balls. Do not scrub or use excessive force. Pat dry using gauze sponges, not tissue or cotton balls. Prim Dressing: Hydrofera Blue Ready Transfer Foam, 2.5x2.5 (in/in) 3 x Per Week/30 Days ary Discharge Instructions: Apply Hydrofera Blue Ready to wound bed as directed Secondary Dressing: Zetuvit Plus 4x4 (in/in) 3 x Per Week/30 Days Secured With: [employer] Food Dressing, Latex-free, Size 5, Small-Head / Shoulder / Thigh 3 x Per Week/30 Days Discharge Instructions: over wrap for comfort Compression Wrap: Urgo K2, two layer compression system, large 3 x Per Week/30 Days Electronic Signature(s) Signed: 06/23/2023 3:33:32 PM By: [name] Najjar MD Signed: 06/27/2023 8:00:25 AM By: [name] Pax RN Entered By: [name] on 06/22/2023 05:38:56 -------------------------------------------------------------------------------- Problem List Details Patient Name: Date of Service: [name], [name] 06/22/2023 8:00 [name] Medical Record Number: [id number] Patient Account Number: [id number] Date of Birth/Sex: Treating RN: [birth year] (52 y.o. [name] Primary Care Provider: Maudie [name] Other Clinician: Referring Provider: Treating Provider/Extender: RO BSO N, [name] in Treatment: 18 Active Problems ICD-10 Encounter Code Description Active Date MDM Diagnosis L97.822 Non-pressure chronic ulcer of other part of left lower leg with fat layer exposed6/19/2024 No Yes [name], [name] ([id number]) [id number] Page  6 of 11 I87.312 Chronic venous hypertension (idiopathic) with ulcer of left lower extremity 02/16/2023 No Yes I89.0 Lymphedema, not elsewhere classified 03/23/2023 No Yes I87.321 Chronic venous hypertension (idiopathic) with inflammation of right lower 02/16/2023 No Yes extremity E11.622 Type 2 diabetes mellitus with other skin ulcer 02/16/2023 No Yes Inactive Problems ICD-10 Code Description Active Date Inactive Date L97.522 Non-pressure chronic ulcer of other part of left foot with fat layer exposed 03/16/2023 03/16/2023 Resolved Problems Electronic Signature(s) Signed: 06/23/2023 3:33:32 PM By: [name] Najjar MD Entered By: [name] on 06/22/2023 05:43:01 -------------------------------------------------------------------------------- Progress Note Details Patient Name: Date of Service: [name], [name] 06/22/2023 8:00 [name] Medical Record Number: [id number] Patient Account Number: [id number] Date of Birth/Sex: Treating RN: [birth year] (52 y.o. [name] Primary Care Provider: Maudie [name] Other Clinician: Referring Provider: Treating Provider/Extender: RO BSO N, [name] in Treatment: 18 Subjective History of Present Illness (HPI) 12/28/17; this is [name] now 52 year old man who is [name] type II diabetic. He was hospitalized from 10/01/17 through 10/19/17. He had an MSSA soft tissue and skin infection. 2 open areas on the left leg were identified he has [name] smaller area on the left medial calf superiorly just below the knee and [name] wound just above the left ankle on the posterior medial aspect. I think both of these were surgical IandD sites when he was in the hospital. He was discharged with [name] wound VAC at that point however this is since been taken off. He follows with Dr. Sampson Goon for the [facility] and he is still on chronic Keflex at 500 twice [name] day. At that time he was hospitalized his hemoglobin A1c was 15.1 however if I'm reading his endocrinologist notes  correctly that is improved. He has been following with Dr. Lorretta Harp [facility] and he has been applying calcium alginate and Unna boots. He has home health changing the dressing. They have also been attempting to get him external compression pumps although the patient is unaware whether they've been approved by insurance at this point. as mentioned he has [name] smaller clean wound on the right lateral  Cleanse the wound with Wound Cleanser prior to applying [name] clean dressing using gauze sponges, not tissues or cotton balls. Do not scrub or use excessive force. Pat dry using gauze sponges, not tissue or cotton balls. Prim Dressing: Hydrofera Blue Ready Transfer Foam, 2.5x2.5 (in/in) 3 x Per Week/30 Days ary Discharge Instructions: Apply Hydrofera Blue Ready to wound bed as directed Secondary Dressing: Zetuvit Plus 4x4 (in/in) 3 x Per Week/30 Days Secured With: [employer] Dressing, Latex-free, Size 5, Small-Head / Shoulder / Thigh 3 x Per Week/30 Days Discharge Instructions: over wrap for comfort Compression Wrap: Urgo K2, two layer compression system, large 3 x Per Week/30 Days Electronic Signature(s) Signed: 06/23/2023 3:33:32 PM By: [name] Najjar MD Signed: 06/27/2023 8:00:25 AM By: [name] Pax RN Entered By: [name] on 06/22/2023 05:38:56 -------------------------------------------------------------------------------- Problem List Details Patient Name: Date of Service: [name], [name] 06/22/2023 8:00 [name] Medical Record Number: [id number] Patient Account Number: [id number] Date of Birth/Sex: Treating RN: [birth year] (52 y.o. [name] Primary Care Provider: Maudie [name] Other Clinician: Referring Provider: Treating Provider/Extender: RO BSO N, [name] in Treatment: 18 Active Problems ICD-10 Encounter Code Description Active Date MDM Diagnosis L97.822 Non-pressure chronic ulcer of other part of left lower leg with fat layer exposed6/19/2024 No Yes [name], [name] ([id number]) [id number] Page  6 of 11 I87.312 Chronic venous hypertension (idiopathic) with ulcer of left lower extremity 02/16/2023 No Yes I89.0 Lymphedema, not elsewhere classified 03/23/2023 No Yes I87.321 Chronic venous hypertension (idiopathic) with inflammation of right lower 02/16/2023 No Yes extremity E11.622 Type 2 diabetes mellitus with other skin ulcer 02/16/2023 No Yes Inactive Problems ICD-10 Code Description Active Date Inactive Date L97.522 Non-pressure chronic ulcer of other part of left foot with fat layer exposed 03/16/2023 03/16/2023 Resolved Problems Electronic Signature(s) Signed: 06/23/2023 3:33:32 PM By: [name] Najjar MD Entered By: [name] on 06/22/2023 05:43:01 -------------------------------------------------------------------------------- Progress Note Details Patient Name: Date of Service: [name], [name] 06/22/2023 8:00 [name] Medical Record Number: [id number] Patient Account Number: [id number] Date of Birth/Sex: Treating RN: [birth year] (52 y.o. [name] Primary Care Provider: Maudie [name] Other Clinician: Referring Provider: Treating Provider/Extender: RO BSO N, [name] in Treatment: 18 Subjective History of Present Illness (HPI) 12/28/17; this is [name] now 52 year old man who is [name] type II diabetic. He was hospitalized from 10/01/17 through 10/19/17. He had an MSSA soft tissue and skin infection. 2 open areas on the left leg were identified he has [name] smaller area on the left medial calf superiorly just below the knee and [name] wound just above the left ankle on the posterior medial aspect. I think both of these were surgical IandD sites when he was in the hospital. He was discharged with [name] wound VAC at that point however this is since been taken off. He follows with Dr. Sampson Goon for the [facility] and he is still on chronic Keflex at 500 twice [name] day. At that time he was hospitalized his hemoglobin A1c was 15.1 however if I'm reading his endocrinologist notes  correctly that is improved. He has been following with Dr. Lorretta Harp at [facility] and he has been applying calcium alginate and Unna boots. He has home health changing the dressing. They have also been attempting to get him external compression pumps although the patient is unaware whether they've been approved by insurance at this point. 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 The wound measures smaller today. Noticeable improvement in the [name], [name] ([id number]) [id number] Page 4 of 11 edema he tells me he is using his external compression pumps once [name] day at least sometimes twice. 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 06/22/2023 8:00 [name] Medical Record Number: [id number] Patient Account Number: [id number] Date of Birth/Sex: Treating RN: [birth year] (52 y.o. [name] Primary Care Provider: Maudie [name] Other Clinician: Referring Provider: Treating Provider/Extender: RO BSO N, [name] in Treatment: 18 Constitutional Sitting or standing Blood Pressure is within target range for patient.. Pulse regular and within target range for patient.[name] Respirations regular, non-labored and within target range.. Temperature is normal and within the target range for the patient.[name] appears in no distress. Notes Wound exam; circular wound in the left mid anterior lower leg. Edema control today is much better noticeable improvement. No evidence of surrounding infection. Pedal pulses palpable Electronic Signature(s) Signed: 06/23/2023 3:33:32 PM By: [name] Najjar MD Entered By: [name] on 06/22/2023 05:45:17 -------------------------------------------------------------------------------- Physician Orders Details Patient Name: Date of Service: [name], [name] 06/22/2023 8:00 [name] Medical Record Number: [id number] Patient Account Number: [id number] Date of Birth/Sex:  Treating RN: September 27, 1970 (52 y.o. [name] Primary Care Provider: Maudie [name] Other Clinician: Referring Provider: Treating Provider/Extender: RO BSO [name], [name] in Treatment: [phone] Verbal / Phone Orders: No Diagnosis Coding Follow-up Appointments Return Appointment in 1 week. Home Health Home Health Company: - [facility] for wound care. May utilize formulary equivalent dressing for wound treatment orders unless otherwise specified. Home Health Nurse may visit PRN to address patients wound care needs. [name] [phone] Change on Monday and Friday. Wound Care Center will change on Wednesday [name], [name] ([id number]) [id number] Page 5 of 11 Scheduled days for dressing changes to be completed; exception, patient has scheduled wound care visit that day. **Please direct any NON-WOUND related issues/requests for orders to patient's Primary Care Physician. **If current dressing causes regression in wound condition, may D/C ordered dressing product/s and apply Normal Saline Moist Dressing daily until next Wound Healing Center or Other MD appointment. **Notify [facility] of regression in wound condition at [phone]. Bathing/ Shower/ Hygiene May shower with wound dressing protected with water repellent cover or cast protector. No tub bath. Anesthetic (Use 'Patient Medications' Section for Anesthetic Order Entry) Lidocaine applied to wound bed Edema Control - Lymphedema / Segmental Compressive Device / Other Optional: One layer of unna paste to top of compression wrap (to act as an anchor). Tubigrip double layer applied - right leg Elevate, Exercise Daily and [name] void Standing for Long Periods of Time. Elevate legs to the level of the heart and pump ankles as often as possible Elevate leg(s) parallel to the floor when sitting. Wound Treatment Wound #22 - Lower Leg Wound Laterality: Left Cleanser:  Soap and Water 3 x Per Week/30 Days Discharge Instructions: Gently cleanse wound with antibacterial soap, rinse and pat dry prior to dressing wounds Cleanser: Wound Cleanser 3 x Per Week/30 Days Discharge Instructions: Wash your hands with soap and water. Remove old dressing, discard into plastic bag and place into trash.  works nights 10/23; Hydrofera Blue under Urgo K2 compression for [name] wound on his left medial lower leg. The wound measures smaller today. Noticeable improvement in the [name], [name] ([id number]) [id number].pdf Page 4 of 11 edema he tells me he is using his external compression pumps once [name] day at least sometimes twice. 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 06/22/2023 8:00 [name] Medical Record Number: [id number] Patient Account Number: [id number] Date of Birth/Sex: Treating RN: [birth year] (52 y.o. [name] Primary Care Provider: Maudie [name] Other Clinician: Referring Provider: Treating Provider/Extender: RO BSO N, [name] in Treatment: 18 Constitutional Sitting or standing Blood Pressure is within target range for patient.. Pulse regular and within target range for patient.[name] Respirations regular, non-labored and within target range.. Temperature is normal and within the target range for the patient.[name] appears in no distress. Notes Wound exam; circular wound in the left mid anterior lower leg. Edema control today is much better noticeable improvement. No evidence of surrounding infection. Pedal pulses palpable Electronic Signature(s) Signed: 06/23/2023 3:33:32 PM By: [name] Najjar MD Entered By: [name] on 06/22/2023 05:45:17 -------------------------------------------------------------------------------- Physician Orders Details Patient Name: Date of Service: [name], [name] 06/22/2023 8:00 [name] Medical Record Number: [id number] Patient Account Number: [id number] Date of Birth/Sex:  Treating RN: September 27, 1970 (52 y.o. [name] Primary Care Provider: Maudie [name] Other Clinician: Referring Provider: Treating Provider/Extender: RO BSO [name], [name] in Treatment: [phone] Verbal / Phone Orders: No Diagnosis Coding Follow-up Appointments Return Appointment in 1 week. Home Health Home Health Company: - [facility] for wound care. May utilize formulary equivalent dressing for wound treatment orders unless otherwise specified. Home Health Nurse may visit PRN to address patients wound care needs. [name] [phone] Change on Monday and Friday. Wound Care Center will change on Wednesday [name], [name] ([id number]) [id number] Page 5 of 11 Scheduled days for dressing changes to be completed; exception, patient has scheduled wound care visit that day. **Please direct any NON-WOUND related issues/requests for orders to patient's Primary Care Physician. **If current dressing causes regression in wound condition, may D/C ordered dressing product/s and apply Normal Saline Moist Dressing daily until next Wound Healing Center or Other MD appointment. **Notify Wound Healing Center of regression in wound condition at [phone]. Bathing/ Shower/ Hygiene May shower with wound dressing protected with water repellent cover or cast protector. No tub bath. Anesthetic (Use 'Patient Medications' Section for Anesthetic Order Entry) Lidocaine applied to wound bed Edema Control - Lymphedema / Segmental Compressive Device / Other Optional: One layer of unna paste to top of compression wrap (to act as an anchor). Tubigrip double layer applied - right leg Elevate, Exercise Daily and [name] void Standing for Long Periods of Time. Elevate legs to the level of the heart and pump ankles as often as possible Elevate leg(s) parallel to the floor when sitting. Wound Treatment Wound #22 - Lower Leg Wound Laterality: Left Cleanser:  Soap and Water 3 x Per Week/30 Days Discharge Instructions: Gently cleanse wound with antibacterial soap, rinse and pat dry prior to dressing wounds Cleanser: Wound Cleanser 3 x Per Week/30 Days Discharge Instructions: Wash your hands with soap and water. Remove old dressing, discard into plastic bag and place into trash.  works nights 10/23; Hydrofera Blue under Urgo K2 compression for [name] wound on his left medial lower leg. The wound measures smaller today. Noticeable improvement in the [name], [name] ([id number]) [id number].pdf Page 4 of 11 edema he tells me he is using his external compression pumps once [name] day at least sometimes twice. 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 06/22/2023 8:00 [name] Medical Record Number: [id number] Patient Account Number: [id number] Date of Birth/Sex: Treating RN: [birth year] (52 y.o. [name] Primary Care Provider: Maudie [name] Other Clinician: Referring Provider: Treating Provider/Extender: RO BSO N, [name] in Treatment: 18 Constitutional Sitting or standing Blood Pressure is within target range for patient.. Pulse regular and within target range for patient.[name] Respirations regular, non-labored and within target range.. Temperature is normal and within the target range for the patient.[name] appears in no distress. Notes Wound exam; circular wound in the left mid anterior lower leg. Edema control today is much better noticeable improvement. No evidence of surrounding infection. Pedal pulses palpable Electronic Signature(s) Signed: 06/23/2023 3:33:32 PM By: [name] Najjar MD Entered By: [name] on 06/22/2023 05:45:17 -------------------------------------------------------------------------------- Physician Orders Details Patient Name: Date of Service: [name], [name] 06/22/2023 8:00 [name] Medical Record Number: [id number] Patient Account Number: [id number] Date of Birth/Sex:  Treating RN: September 27, 1970 (52 y.o. [name] Primary Care Provider: Maudie [name] Other Clinician: Referring Provider: Treating Provider/Extender: RO BSO [name], [name] in Treatment: [phone] Verbal / Phone Orders: No Diagnosis Coding Follow-up Appointments Return Appointment in 1 week. Home Health Home Health Company: - [facility] for wound care. May utilize formulary equivalent dressing for wound treatment orders unless otherwise specified. Home Health Nurse may visit PRN to address patients wound care needs. [name] [phone] Change on Monday and Friday. Wound Care Center will change on Wednesday [name], [name] ([id number]) [id number] Page 5 of 11 Scheduled days for dressing changes to be completed; exception, patient has scheduled wound care visit that day. **Please direct any NON-WOUND related issues/requests for orders to patient's Primary Care Physician. **If current dressing causes regression in wound condition, may D/C ordered dressing product/s and apply Normal Saline Moist Dressing daily until next Wound Healing Center or Other MD appointment. **Notify Wound Healing Center of regression in wound condition at [phone]. Bathing/ Shower/ Hygiene May shower with wound dressing protected with water repellent cover or cast protector. No tub bath. Anesthetic (Use 'Patient Medications' Section for Anesthetic Order Entry) Lidocaine applied to wound bed Edema Control - Lymphedema / Segmental Compressive Device / Other Optional: One layer of unna paste to top of compression wrap (to act as an anchor). Tubigrip double layer applied - right leg Elevate, Exercise Daily and [name] void Standing for Long Periods of Time. Elevate legs to the level of the heart and pump ankles as often as possible Elevate leg(s) parallel to the floor when sitting. Wound Treatment Wound #22 - Lower Leg Wound Laterality: Left Cleanser:  Soap and Water 3 x Per Week/30 Days Discharge Instructions: Gently cleanse wound with antibacterial soap, rinse and pat dry prior to dressing wounds Cleanser: Wound Cleanser 3 x Per Week/30 Days Discharge Instructions: Wash your hands with soap and water. Remove old dressing, discard into plastic bag and place into trash.  Cleanse the wound with Wound Cleanser prior to applying [name] clean dressing using gauze sponges, not tissues or cotton balls. Do not scrub or use excessive force. Pat dry using gauze sponges, not tissue or cotton balls. Prim Dressing: Hydrofera Blue Ready Transfer Foam, 2.5x2.5 (in/in) 3 x Per Week/30 Days ary Discharge Instructions: Apply Hydrofera Blue Ready to wound bed as directed Secondary Dressing: Zetuvit Plus 4x4 (in/in) 3 x Per Week/30 Days Secured With: [employer] Dressing, Latex-free, Size 5, Small-Head / Shoulder / Thigh 3 x Per Week/30 Days Discharge Instructions: over wrap for comfort Compression Wrap: Urgo K2, two layer compression system, large 3 x Per Week/30 Days Electronic Signature(s) Signed: 06/23/2023 3:33:32 PM By: [name] Najjar MD Signed: 06/27/2023 8:00:25 AM By: [name] Pax RN Entered By: [name] on 06/22/2023 05:38:56 -------------------------------------------------------------------------------- Problem List Details Patient Name: Date of Service: [name], [name] 06/22/2023 8:00 [name] Medical Record Number: [id number] Patient Account Number: [id number] Date of Birth/Sex: Treating RN: [birth year] (52 y.o. [name] Primary Care Provider: Maudie [name] Other Clinician: Referring Provider: Treating Provider/Extender: RO BSO N, [name] in Treatment: 18 Active Problems ICD-10 Encounter Code Description Active Date MDM Diagnosis L97.822 Non-pressure chronic ulcer of other part of left lower leg with fat layer exposed6/19/2024 No Yes [name], [name] ([id number]) [id number] Page  6 of 11 I87.312 Chronic venous hypertension (idiopathic) with ulcer of left lower extremity 02/16/2023 No Yes I89.0 Lymphedema, not elsewhere classified 03/23/2023 No Yes I87.321 Chronic venous hypertension (idiopathic) with inflammation of right lower 02/16/2023 No Yes extremity E11.622 Type 2 diabetes mellitus with other skin ulcer 02/16/2023 No Yes Inactive Problems ICD-10 Code Description Active Date Inactive Date L97.522 Non-pressure chronic ulcer of other part of left foot with fat layer exposed 03/16/2023 03/16/2023 Resolved Problems Electronic Signature(s) Signed: 06/23/2023 3:33:32 PM By: [name] Najjar MD Entered By: [name] on 06/22/2023 05:43:01 -------------------------------------------------------------------------------- Progress Note Details Patient Name: Date of Service: [name], [name] 06/22/2023 8:00 [name] Medical Record Number: [id number] Patient Account Number: [id number] Date of Birth/Sex: Treating RN: [birth year] (52 y.o. [name] Primary Care Provider: Maudie [name] Other Clinician: Referring Provider: Treating Provider/Extender: RO BSO N, [name] in Treatment: 18 Subjective History of Present Illness (HPI) 12/28/17; this is [name] now 52 year old man who is [name] type II diabetic. He was hospitalized from 10/01/17 through 10/19/17. He had an MSSA soft tissue and skin infection. 2 open areas on the left leg were identified he has [name] smaller area on the left medial calf superiorly just below the knee and [name] wound just above the left ankle on the posterior medial aspect. I think both of these were surgical IandD sites when he was in the hospital. He was discharged with [name] wound VAC at that point however this is since been taken off. He follows with Dr. Sampson Goon for the [facility] and he is still on chronic Keflex at 500 twice [name] day. At that time he was hospitalized his hemoglobin A1c was 15.1 however if I'm reading his endocrinologist notes  correctly that is improved. He has been following with Dr. Lorretta Harp at vein and vascular and he has been applying calcium alginate and Unna boots. He has home health changing the dressing. They have also been attempting to get him external compression pumps although the patient is unaware whether they've been approved by insurance at this point. as mentioned he has [name] smaller clean wound on the right lateral  [name], GIFFIN [name] [phone] [id number] Page 1 of 11 Visit Report for 06/22/2023 HPI Details Patient Name: Date of Service: [name], [name] 06/22/2023 8:00 [name] Medical Record Number: [id number] Patient Account Number: [id number] Date of Birth/Sex: Treating RN: 1971-05-29 (52 y.o. [name]) [name] Primary Care Provider: Maudie [name] Other Clinician: Referring Provider: Treating Provider/Extender: RO BSO N, [name] in Treatment: 18 History of Present Illness HPI Description: 12/28/17; this is [name] now 52 year old man who is [name] type II diabetic. He was hospitalized from 10/01/17 through 10/19/17. He had an MSSA soft tissue and skin infection. 2 open areas on the left leg were identified he has [name] smaller area on the left medial calf superiorly just below the knee and [name] wound just above the left ankle on the posterior medial aspect. I think both of these were surgical IandD sites when he was in the hospital. He was discharged with [name] wound VAC at that point however this is since been taken off. He follows with Dr. Sampson Goon for the [facility] and he is still on chronic Keflex at 500 twice [name] day. At that time he was hospitalized his hemoglobin A1c was 15.1 however if I'm reading his endocrinologist notes correctly that is improved. He has been following with Dr. Lorretta Harp at vein and vascular and he has been applying calcium alginate and Unna boots. He has home health changing the dressing. They have also been attempting to get him external compression pumps although the patient is unaware whether they've been approved by insurance at this point. as mentioned he has [name] smaller clean wound on the right lateral calf just below the knee and he has [name] much larger area just above the left ankle medially and posteriorly. Our intake nurse reported greenish purulent looking drainage.the patient did have surgical material sent to pathology in February. This  showed chronic abscess The patient also has lymphedema stage III in the left greater than right lower extremities. He has [name] history of blisters with wounds but these of all were always healed. The patient thinks that the lymphedema may have been present since he was about 52 years old i.e. about 30 years. He does not have graded pressure stockings and has not worn stockings. He does not have [name] distant history of DVT PE or phlebitis. He has not been systemically unwell fever no chills. He states that his Lasix is recently been reduced. He tells me his kidney function is at "30%" and he has been followed by Dr. Thedore Mins of nephrology. At one point he was on Lasix 80 twice [name] day however that's been cut back and he is now on Lasix at 20 twice [name] day. The patient has [name] history of PAD listed in his records although he comes from Dr. Lorretta Harp I don't think is felt to have significant PAD. ABIs in our clinic were noncompressible bilaterally. 01/04/18; patient has [name] large wound on the left lateral lower calf and [name] small wound on the left medial upper calf. He has been to see his nephrologist who changed him to Demadex 40 mg [name] day. I'm hopeful this will help with his systemic fluid overload. He also has stage III lymphedema. Really no change in the 2 wounds since last week 01/11/18; the patient is down 13 pounds. He put his stage III lymphedema left leg in 4K compression last week and there is less edema fluid however we still haven't been able to communicate with home health but  Cleanse the wound with Wound Cleanser prior to applying [name] clean dressing using gauze sponges, not tissues or cotton balls. Do not scrub or use excessive force. Pat dry using gauze sponges, not tissue or cotton balls. Prim Dressing: Hydrofera Blue Ready Transfer Foam, 2.5x2.5 (in/in) 3 x Per Week/30 Days ary Discharge Instructions: Apply Hydrofera Blue Ready to wound bed as directed Secondary Dressing: Zetuvit Plus 4x4 (in/in) 3 x Per Week/30 Days Secured With: [employer] Dressing, Latex-free, Size 5, Small-Head / Shoulder / Thigh 3 x Per Week/30 Days Discharge Instructions: over wrap for comfort Compression Wrap: Urgo K2, two layer compression system, large 3 x Per Week/30 Days Electronic Signature(s) Signed: 06/23/2023 3:33:32 PM By: [name] Najjar MD Signed: 06/27/2023 8:00:25 AM By: [name] Pax RN Entered By: [name] on 06/22/2023 05:38:56 -------------------------------------------------------------------------------- Problem List Details Patient Name: Date of Service: [name], [name] 06/22/2023 8:00 [name] Medical Record Number: [id number] Patient Account Number: [id number] Date of Birth/Sex: Treating RN: [birth year] (52 y.o. [name] Primary Care Provider: Maudie [name] Other Clinician: Referring Provider: Treating Provider/Extender: RO BSO N, [name] in Treatment: 18 Active Problems ICD-10 Encounter Code Description Active Date MDM Diagnosis L97.822 Non-pressure chronic ulcer of other part of left lower leg with fat layer exposed6/19/2024 No Yes [name], [name] ([id number]) [id number] Page  6 of 11 I87.312 Chronic venous hypertension (idiopathic) with ulcer of left lower extremity 02/16/2023 No Yes I89.0 Lymphedema, not elsewhere classified 03/23/2023 No Yes I87.321 Chronic venous hypertension (idiopathic) with inflammation of right lower 02/16/2023 No Yes extremity E11.622 Type 2 diabetes mellitus with other skin ulcer 02/16/2023 No Yes Inactive Problems ICD-10 Code Description Active Date Inactive Date L97.522 Non-pressure chronic ulcer of other part of left foot with fat layer exposed 03/16/2023 03/16/2023 Resolved Problems Electronic Signature(s) Signed: 06/23/2023 3:33:32 PM By: [name] Najjar MD Entered By: [name] on 06/22/2023 05:43:01 -------------------------------------------------------------------------------- Progress Note Details Patient Name: Date of Service: [name], [name] 06/22/2023 8:00 [name] Medical Record Number: [id number] Patient Account Number: [id number] Date of Birth/Sex: Treating RN: [birth year] (52 y.o. [name] Primary Care Provider: Maudie [name] Other Clinician: Referring Provider: Treating Provider/Extender: RO BSO N, [name] in Treatment: 18 Subjective History of Present Illness (HPI) 12/28/17; this is [name] now 52 year old man who is [name] type II diabetic. He was hospitalized from 10/01/17 through 10/19/17. He had an MSSA soft tissue and skin infection. 2 open areas on the left leg were identified he has [name] smaller area on the left medial calf superiorly just below the knee and [name] wound just above the left ankle on the posterior medial aspect. I think both of these were surgical IandD sites when he was in the hospital. He was discharged with [name] wound VAC at that point however this is since been taken off. He follows with Dr. Sampson Goon for the [facility] and he is still on chronic Keflex at 500 twice [name] day. At that time he was hospitalized his hemoglobin A1c was 15.1 however if I'm reading his endocrinologist notes  correctly that is improved. He has been following with Dr. Lorretta Harp at vein and vascular and he has been applying calcium alginate and Unna boots. He has home health changing the dressing. They have also been attempting to get him external compression pumps although the patient is unaware whether they've been approved by insurance at this point. as mentioned he has [name] smaller clean wound on the right lateral  Cleanse the wound with Wound Cleanser prior to applying [name] clean dressing using gauze sponges, not tissues or cotton balls. Do not scrub or use excessive force. Pat dry using gauze sponges, not tissue or cotton balls. Prim Dressing: Hydrofera Blue Ready Transfer Foam, 2.5x2.5 (in/in) 3 x Per Week/30 Days ary Discharge Instructions: Apply Hydrofera Blue Ready to wound bed as directed Secondary Dressing: Zetuvit Plus 4x4 (in/in) 3 x Per Week/30 Days Secured With: [employer] Dressing, Latex-free, Size 5, Small-Head / Shoulder / Thigh 3 x Per Week/30 Days Discharge Instructions: over wrap for comfort Compression Wrap: Urgo K2, two layer compression system, large 3 x Per Week/30 Days Electronic Signature(s) Signed: 06/23/2023 3:33:32 PM By: [name] Najjar MD Signed: 06/27/2023 8:00:25 AM By: [name] Pax RN Entered By: [name] on 06/22/2023 05:38:56 -------------------------------------------------------------------------------- Problem List Details Patient Name: Date of Service: [name], [name] 06/22/2023 8:00 [name] Medical Record Number: [id number] Patient Account Number: [id number] Date of Birth/Sex: Treating RN: [birth year] (52 y.o. [name] Primary Care Provider: Maudie [name] Other Clinician: Referring Provider: Treating Provider/Extender: RO BSO N, [name] in Treatment: 18 Active Problems ICD-10 Encounter Code Description Active Date MDM Diagnosis L97.822 Non-pressure chronic ulcer of other part of left lower leg with fat layer exposed6/19/2024 No Yes [name], [name] ([id number]) [id number] Page  6 of 11 I87.312 Chronic venous hypertension (idiopathic) with ulcer of left lower extremity 02/16/2023 No Yes I89.0 Lymphedema, not elsewhere classified 03/23/2023 No Yes I87.321 Chronic venous hypertension (idiopathic) with inflammation of right lower 02/16/2023 No Yes extremity E11.622 Type 2 diabetes mellitus with other skin ulcer 02/16/2023 No Yes Inactive Problems ICD-10 Code Description Active Date Inactive Date L97.522 Non-pressure chronic ulcer of other part of left foot with fat layer exposed 03/16/2023 03/16/2023 Resolved Problems Electronic Signature(s) Signed: 06/23/2023 3:33:32 PM By: [name] Najjar MD Entered By: [name] on 06/22/2023 05:43:01 -------------------------------------------------------------------------------- Progress Note Details Patient Name: Date of Service: [name], [name] 06/22/2023 8:00 [name] Medical Record Number: [id number] Patient Account Number: [id number] Date of Birth/Sex: Treating RN: [birth year] (52 y.o. [name] Primary Care Provider: Maudie [name] Other Clinician: Referring Provider: Treating Provider/Extender: RO BSO N, [name] in Treatment: 18 Subjective History of Present Illness (HPI) 12/28/17; this is [name] now 52 year old man who is [name] type II diabetic. He was hospitalized from 10/01/17 through 10/19/17. He had an MSSA soft tissue and skin infection. 2 open areas on the left leg were identified he has [name] smaller area on the left medial calf superiorly just below the knee and [name] wound just above the left ankle on the posterior medial aspect. I think both of these were surgical IandD sites when he was in the hospital. He was discharged with [name] wound VAC at that point however this is since been taken off. He follows with Dr. Sampson Goon for the [facility] and he is still on chronic Keflex at 500 twice [name] day. At that time he was hospitalized his hemoglobin A1c was 15.1 however if I'm reading his endocrinologist notes  correctly that is improved. He has been [facility] and he has been applying calcium alginate and Unna boots. He has home health changing the dressing. They have also been attempting to get him external compression pumps although the patient is unaware whether they've been approved by insurance at this point. as mentioned he has [name] smaller clean wound on the right lateral  Cleanse the wound with Wound Cleanser prior to applying [name] clean dressing using gauze sponges, not tissues or cotton balls. Do not scrub or use excessive force. Pat dry using gauze sponges, not tissue or cotton balls. Prim Dressing: Hydrofera Blue Ready Transfer Foam, 2.5x2.5 (in/in) 3 x Per Week/30 Days ary Discharge Instructions: Apply Hydrofera Blue Ready to wound bed as directed Secondary Dressing: Zetuvit Plus 4x4 (in/in) 3 x Per Week/30 Days Secured With: [employer] Food Dressing, Latex-free, Size 5, Small-Head / Shoulder / Thigh 3 x Per Week/30 Days Discharge Instructions: over wrap for comfort Compression Wrap: Urgo K2, two layer compression system, large 3 x Per Week/30 Days Electronic Signature(s) Signed: 06/23/2023 3:33:32 PM By: [name] Najjar MD Signed: 06/27/2023 8:00:25 AM By: [name] Pax RN Entered By: [name] on 06/22/2023 05:38:56 -------------------------------------------------------------------------------- Problem List Details Patient Name: Date of Service: [name], [name] 06/22/2023 8:00 [name] Medical Record Number: [id number] Patient Account Number: [id number] Date of Birth/Sex: Treating RN: [birth year] (52 y.o. [name] Primary Care Provider: Maudie [name] Other Clinician: Referring Provider: Treating Provider/Extender: RO BSO N, [name] in Treatment: 18 Active Problems ICD-10 Encounter Code Description Active Date MDM Diagnosis L97.822 Non-pressure chronic ulcer of other part of left lower leg with fat layer exposed6/19/2024 No Yes [name], [name] ([id number]) [id number] Page  6 of 11 I87.312 Chronic venous hypertension (idiopathic) with ulcer of left lower extremity 02/16/2023 No Yes I89.0 Lymphedema, not elsewhere classified 03/23/2023 No Yes I87.321 Chronic venous hypertension (idiopathic) with inflammation of right lower 02/16/2023 No Yes extremity E11.622 Type 2 diabetes mellitus with other skin ulcer 02/16/2023 No Yes Inactive Problems ICD-10 Code Description Active Date Inactive Date L97.522 Non-pressure chronic ulcer of other part of left foot with fat layer exposed 03/16/2023 03/16/2023 Resolved Problems Electronic Signature(s) Signed: 06/23/2023 3:33:32 PM By: [name] Najjar MD Entered By: [name] on 06/22/2023 05:43:01 -------------------------------------------------------------------------------- Progress Note Details Patient Name: Date of Service: [name], [name] 06/22/2023 8:00 [name] Medical Record Number: [id number] Patient Account Number: [id number] Date of Birth/Sex: Treating RN: [birth year] (52 y.o. [name] Primary Care Provider: Maudie [name] Other Clinician: Referring Provider: Treating Provider/Extender: RO BSO N, [name] in Treatment: 18 Subjective History of Present Illness (HPI) 12/28/17; this is [name] now 52 year old man who is [name] type II diabetic. He was hospitalized from 10/01/17 through 10/19/17. He had an MSSA soft tissue and skin infection. 2 open areas on the left leg were identified he has [name] smaller area on the left medial calf superiorly just below the knee and [name] wound just above the left ankle on the posterior medial aspect. I think both of these were surgical IandD sites when he was in the hospital. He was discharged with [name] wound VAC at that point however this is since been taken off. He follows with Dr. Sampson Goon for the [facility] and he is still on chronic Keflex at 500 twice [name] day. At that time he was hospitalized his hemoglobin A1c was 15.1 however if I'm reading his endocrinologist notes  correctly that is improved. He has been following with Dr[facility] and he has been applying calcium alginate and Unna boots. He has home health changing the dressing. They have also been attempting to get him external compression pumps although the patient is unaware whether they've been approved by insurance at this point. as mentioned he has [name] smaller clean wound on the right lateral  venous hypertension (idiopathic) with ulcer of left lower extremity Lymphedema, not elsewhere classified Chronic venous hypertension (idiopathic) with inflammation of right lower extremity Type 2 diabetes mellitus with other skin ulcer Procedures [name], [name] ([id number]) [id number] Page 10 of 11 Wound #22 Pre-procedure diagnosis of Wound #22 is [name] Diabetic Wound/Ulcer of the Lower Extremity located on the Left Lower Leg . There was [name] Double Layer Compression Therapy Procedure by [name] Pax, RN. Post procedure Diagnosis Wound #22: Same as Pre-Procedure Plan Follow-up Appointments: Return Appointment in 1 week. Home Health: Home Health Company: - [facility] for wound care. May utilize formulary equivalent dressing for wound treatment orders unless otherwise specified. Home Health Nurse may visit PRN to address patients wound care needs. [name] [phone] Change on Monday and Friday. Wound Care Center will change on Wednesday Scheduled days for dressing changes to be completed; exception, patient has scheduled wound care visit that day. **Please direct any NON-WOUND related issues/requests for orders to patient's Primary Care Physician. **If current dressing causes regression in wound condition, may D/C ordered dressing product/s and apply Normal Saline Moist  Dressing daily until next Wound Healing Center or Other MD appointment. **Notify Wound Healing Center of regression in wound condition at [phone]. Bathing/ Shower/ Hygiene: May shower with wound dressing protected with water repellent cover or cast protector. No tub bath. Anesthetic (Use 'Patient Medications' Section for Anesthetic Order Entry): Lidocaine applied to wound bed Edema Control - Lymphedema / Segmental Compressive Device / Other: Optional: One layer of unna paste to top of compression wrap (to act as an anchor). Tubigrip double layer applied - right leg Elevate, Exercise Daily and Avoid Standing for Long Periods of Time. Elevate legs to the level of the heart and pump ankles as often as possible Elevate leg(s) parallel to the floor when sitting. WOUND #22: - Lower Leg Wound Laterality: Left Cleanser: Soap and Water 3 x Per Week/30 Days Discharge Instructions: Gently cleanse wound with antibacterial soap, rinse and pat dry prior to dressing wounds Cleanser: Wound Cleanser 3 x Per Week/30 Days Discharge Instructions: Wash your hands with soap and water. Remove old dressing, discard into plastic bag and place into trash. Cleanse the wound with Wound Cleanser prior to applying [name] clean dressing using gauze sponges, not tissues or cotton balls. Do not scrub or use excessive force. Pat dry using gauze sponges, not tissue or cotton balls. Prim Dressing: Hydrofera Blue Ready Transfer Foam, 2.5x2.5 (in/in) 3 x Per Week/30 Days ary Discharge Instructions: Apply Hydrofera Blue Ready to wound bed as directed Secondary Dressing: Zetuvit Plus 4x4 (in/in) 3 x Per Week/30 Days Secured With: [employer] Food Dressing, Latex-free, Size 5, Small-Head / Shoulder / Thigh 3 x Per Week/30 Days Discharge Instructions: over wrap for comfort Com pression Wrap: Urgo K2, two layer compression system, large 3 x Per Week/30 Days 1. No change the dressing Hydrofera Blue ABDs Urgo K2 2. Continue with the  compression pumps 3. The patient has some form of external compression stockingo Juxta lites. I have asked him to put them in his trunk for next week in case this has closed Electronic Signature(s) Signed: 06/23/2023 3:33:32 PM By: [name] Najjar MD Entered By: [name] on 06/22/2023 05:46:02 -------------------------------------------------------------------------------- SuperBill Details Patient Name: Date of Service: [name], [name] 06/22/2023 Medical Record Number: [id number] Patient Account Number: [id number] Date of Birth/Sex: Treating RN: 03/15/1971 (52 y.o. [name] Primary Care Provider: Maudie [name] Other Clinician: Referring Provider: Treating Provider/Extender: RO BSO N,

## 2023-06-27 NOTE — Progress Notes (Signed)
0 Complex Staple / Suture removal (26 or more) []  - 0 Hypo/Hyperglycemic Management (do not check if billed separately) []  - 0 Ankle / Brachial Index (ABI) - do not check if billed separately Has the patient been seen at the hospital within the last three years: Yes Total Score: 0 Level Of Care: ____ Electronic Signature(s) Signed: 06/02/2023 9:05:28 AM By: Betha Loa Entered By: Betha Loa on 06/01/2023 05:39:36 -------------------------------------------------------------------------------- Compression Therapy Details Patient Name: Date of Service: [name], [name] SLO W A. 06/01/2023 8:15 A M Medical Record Number: [id number] Patient Account Number: [id number] Date of Birth/Sex: Treating RN: 23-Oct-1970 (52 y.o. [name] Primary Care Jailee Jaquez: [name] Other Clinician: Gaven, Risko A ([id number]) [id number] Page 3 of 9 Referring Marlina Cataldi: Treating Zaniyah Wernette/Extender: RO BSO N, MICHA EL [place] in Treatment: 15 Compression Therapy Performed for Wound Assessment: Wound #22 Left Lower Leg Performed By: Farrel Gordon, Angie, Compression Type: Double Layer Post Procedure Diagnosis Same as Pre-procedure Notes ABI [place], patient tolerated treatment well Electronic Signature(s) Signed: 06/02/2023 9:05:28 AM By: Betha Loa Entered By: Betha Loa on 06/01/2023 05:38:59 -------------------------------------------------------------------------------- Encounter Discharge Information Details Patient Name: Date of Service: [name], [name] SLO W A. 06/01/2023 8:15 A M Medical Record Number: [id number] Patient Account Number: [id number] Date of Birth/Sex: Treating RN: 02/17/1971 (52 y.o. [name]) [name] Primary Care Ariyel Jeangilles: [name] Other Clinician: Betha Loa Referring Ardyth Kelso: Treating  Ramey Ketcherside/Extender: RO BSO N, MICHA EL [place] in Treatment: 15 Encounter Discharge Information Items Post Procedure Vitals Discharge Condition: Stable Temperature (F): 98.2 Ambulatory Status: Ambulatory Pulse (bpm): 75 Discharge Destination: Home Respiratory Rate (breaths/min): 18 Transportation: Private Auto Blood Pressure (mmHg): 125/78 Accompanied By: self Schedule Follow-up Appointment: Yes Clinical Summary of Care: Electronic Signature(s) Signed: 06/02/2023 9:05:28 AM By: Betha Loa Entered By: Betha Loa on 06/01/2023 05:53:57 -------------------------------------------------------------------------------- Lower Extremity Assessment Details Patient Name: Date of Service: [name], [name] SLO W A. 06/01/2023 8:15 A M Medical Record Number: [id number] Patient Account Number: [id number] Date of Birth/Sex: Treating RN: 1971-07-03 (52 y.o. [name] Primary Care Lyndall Windt: [name] Other Clinician: Betha Loa Referring Daran Favaro: Treating Joanmarie Tsang/Extender: Chauncey Mann, MICHA EL [place] in Treatment: 15 Halliday, Nikki A ([id number]) [id number] Page 4 of 9 Edema Assessment Assessed: [Left: Yes] [Right: No] Edema: [Left: Ye] [Right: s] Calf Left: Right: Point of Measurement: 40 cm From Medial Instep 47.3 cm Ankle Left: Right: Point of Measurement: 10 cm From Medial Instep 39.5 cm Vascular Assessment Pulses: Dorsalis Pedis Palpable: [Left:Yes] Toe Nail Assessment Left: Right: Thick: Yes Discolored: No Deformed: No Improper Length and Hygiene: No Electronic Signature(s) Signed: 06/02/2023 9:05:28 AM By: Betha Loa Signed: 06/27/2023 8:01:26 AM By: [name] RN Entered By: Betha Loa on 06/01/2023 05:30:10 -------------------------------------------------------------------------------- Multi Wound Chart Details Patient Name: Date of Service: [name], [name] SLO W A. 06/01/2023 8:15 A  M Medical Record Number: [id number] Patient Account Number: [id number] Date of Birth/Sex: Treating RN: 1971-04-02 (52 y.o. [name] Primary Care Camren Henthorn: [name] Other Clinician: Betha Loa Referring Warnie Belair: Treating Harla Mensch/Extender: RO BSO N, MICHA EL [place] in Treatment: 15 Vital Signs Height(in): 76 Pulse(bpm): 75 Weight(lbs): 400 Blood Pressure(mmHg): 125/78 Body Mass Index(BMI): 48.7 Temperature(F): 98.2 Respiratory Rate(breaths/min): 18 [22:Photos:] [N/A:N/A] Left Lower Leg N/A N/A Wound Location: Gradually Appeared N/A N/A Wounding Event: Diabetic Wound/Ulcer of the Lower N/A N/A Primary Etiology: Extremity Hypertension, Type II Diabetes, N/A N/A Comorbid History: Neuropathy 08/30/2022 N/A N/A Date Acquired:  15 N/A N/A Weeks of Treatment: Open N/A N/A Wound Status: No N/A N/A Wound Recurrence: 3.4x2x0.1 N/A N/A Measurements L x W x D (cm) 5.341 N/A N/A A (cm) : rea 0.534 N/A N/A Volume (cm) : 94.10% N/A N/A % Reduction in A rea: 99.00% N/A N/A % Reduction in Volume: Grade 2 N/A N/A Classification: Medium N/A N/A Exudate A mount: Serosanguineous N/A N/A Exudate Type: red, brown N/A N/A Exudate Color: Large (67-100%) N/A N/A Granulation A mount: Red N/A N/A Granulation Quality: Small (1-33%) N/A N/A Necrotic A mount: Fat Layer (Subcutaneous Tissue): Yes N/A N/A Exposed Structures: Fascia: No Tendon: No Muscle: No Joint: No Bone: No None N/A N/A Epithelialization: Treatment Notes Electronic Signature(s) Signed: 06/02/2023 9:05:28 AM By: Betha Loa Entered By: Betha Loa on 06/01/2023 05:30:20 -------------------------------------------------------------------------------- Multi-Disciplinary Care Plan Details Patient Name: Date of Service: [name], [name] SLO W A. 06/01/2023 8:15 A M Medical Record Number: [id number] Patient Account Number: [id number] Date of Birth/Sex: Treating RN: 15-May-1971 (52  y.o. [name] Primary Care Eily Louvier: [name] Other Clinician: Betha Loa Referring Arjay Jaskiewicz: Treating Aslin Farinas/Extender: RO BSO N, MICHA EL [place] in Treatment: 15 Active Inactive Wound/Skin Impairment Nursing Diagnoses: Knowledge deficit related to ulceration/compromised skin integrity Goals: Patient/caregiver will verbalize understanding of skin care regimen Date Initiated: 03/09/2023 Date Inactivated: 04/13/2023 Target Resolution Date: 04/09/2023 Goal Status: Met Ulcer/skin breakdown will have a volume reduction of 30% by week 4 Date Initiated: 03/09/2023 Date Inactivated: 04/13/2023 Target Resolution Date: 04/09/2023 Goal Status: Met Ulcer/skin breakdown will have a volume reduction of 50% by week 8 Date Initiated: 03/09/2023 Date Inactivated: 05/18/2023 Target Resolution Date: 05/10/2023 EASTON, PFUHL ([id number]) [phone].pdf Page 6 of 9 Goal Status: Met Ulcer/skin breakdown will have a volume reduction of 80% by week 12 Date Initiated: 03/09/2023 Target Resolution Date: 06/09/2023 Goal Status: Active Ulcer/skin breakdown will heal within 14 weeks Date Initiated: 03/09/2023 Target Resolution Date: 07/10/2023 Goal Status: Active Interventions: Assess patient/caregiver ability to obtain necessary supplies Assess patient/caregiver ability to perform ulcer/skin care regimen upon admission and as needed Assess ulceration(s) every visit Notes: Electronic Signature(s) Signed: 06/02/2023 9:05:28 AM By: Betha Loa Signed: 06/27/2023 8:01:26 AM By: [name] RN Entered By: Betha Loa on 06/01/2023 05:52:51 -------------------------------------------------------------------------------- Pain Assessment Details Patient Name: Date of Service: [name], [name] SLO W A. 06/01/2023 8:15 A M Medical Record Number: [id number] Patient Account Number: [id number] Date of Birth/Sex: Treating RN: 09-06-1970 (52 y.o. [name] Primary Care Tasnia Spegal: [name] Other Clinician: Betha Loa Referring Aleanna Menge: Treating Ovidio Steele/Extender: RO BSO N, MICHA EL [place] in Treatment: 15 Active Problems Location of Pain Severity and Description of Pain Patient Has Paino No Site Locations Pain Management and Medication Current Pain Management: Electronic Signature(s) Signed: 06/02/2023 9:05:28 AM By: Betha Loa Signed: 06/27/2023 8:01:26 AM By: [name] RN Entered By: Betha Loa on 06/01/2023 05:16:24 Mccready, Ace A ([id number]) [id number] Page 7 of 9 -------------------------------------------------------------------------------- Patient/Caregiver Education Details Patient Name: Date of Service: [name], [name] SLO W A. 10/2/2024andnbsp8:15 A M Medical Record Number: [id number] Patient Account Number: [id number] Date of Birth/Gender: Treating RN: 04-19-1971 (52 y.o. [name] Primary Care Physician: [name] Other Clinician: Betha Loa Referring Physician: Treating Physician/Extender: RO BSO [name], MICHA EL [place] in Treatment: 15 Education Assessment Education Provided To: Patient Education Topics Provided Wound/Skin Impairment: Handouts: Other: continue wound care as directed Methods: Explain/Verbal Responses: State content correctly Electronic Signature(s) Signed: 06/02/2023 9:05:28 AM By: Betha Loa Entered  By: Betha Loa on 06/01/2023 05:53:11 -------------------------------------------------------------------------------- Wound Assessment Details Patient Name: Date of Service: [name], [name] SLO W A. 06/01/2023 8:15 A M Medical Record Number: [id number] Patient Account Number: [id number] Date of Birth/Sex: Treating RN: 27-Feb-1971 (52 y.o. [name]) [name] Primary Care Laquon Emel: [name] Other Clinician: Betha Loa Referring Capone Schwinn: Treating Randilyn Foisy/Extender: RO BSO N, MICHA EL  [place] in Treatment: 15 Wound Status Wound Number: 22 Primary Etiology: Diabetic Wound/Ulcer of the Lower Extremity Wound Location: Left Lower Leg Wound Status: Open Wounding Event: Gradually Appeared Comorbid History: Hypertension, Type II Diabetes, Neuropathy Date Acquired: 08/30/2022 Weeks Of Treatment: 15 Clustered Wound: No Photos Gu, Orvil A ([id number]) [id number] Page 8 of 9 Wound Measurements Length: (cm) 3.4 Width: (cm) 2 Depth: (cm) 0.1 Area: (cm) 5.341 Volume: (cm) 0.534 % Reduction in Area: 94.1% % Reduction in Volume: 99% Epithelialization: None Wound Description Classification: Grade 2 Exudate Amount: Medium Exudate Type: Serosanguineous Exudate Color: red, brown Foul Odor After Cleansing: No Slough/Fibrino Yes Wound Bed Granulation Amount: Large (67-100%) Exposed Structure Granulation Quality: Red Fascia Exposed: No Necrotic Amount: Small (1-33%) Fat Layer (Subcutaneous Tissue) Exposed: Yes Necrotic Quality: Adherent Slough Tendon Exposed: No Muscle Exposed: No Joint Exposed: No Bone Exposed: No Electronic Signature(s) Signed: 06/02/2023 9:05:28 AM By: Betha Loa Signed: 06/27/2023 8:01:26 AM By: [name] RN Entered By: Betha Loa on 06/01/2023 05:27:59 -------------------------------------------------------------------------------- Vitals Details Patient Name: Date of Service: [name], [name] SLO W A. 06/01/2023 8:15 A M Medical Record Number: [id number] Patient Account Number: [id number] Date of Birth/Sex: Treating RN: Oct 13, 1970 (52 y.o. [name]) [name] Primary Care Denita Lun: [name] Other Clinician: Betha Loa Referring Sidonie Dexheimer: Treating Hoy Fallert/Extender: RO BSO N, MICHA EL [place] in Treatment: 15 Vital Signs Time Taken: 08:13 Temperature (F): 98.2 Height (in): 76 Pulse (bpm): 75 Weight (lbs): 400 Respiratory Rate (breaths/min): 18 Body Mass Index  (BMI): 48.7 Blood Pressure (mmHg): 125/78 Reference Range: 80 - 120 mg / dl Electronic Signature(s) Signed: 06/02/2023 9:05:28 AM By: Manning Charity, Esgar A ([id number]) [id number] Page 9 of 9 Entered By: Betha Loa on 06/01/2023 05:16:18  15 N/A N/A Weeks of Treatment: Open N/A N/A Wound Status: No N/A N/A Wound Recurrence: 3.4x2x0.1 N/A N/A Measurements L x W x D (cm) 5.341 N/A N/A A (cm) : rea 0.534 N/A N/A Volume (cm) : 94.10% N/A N/A % Reduction in A rea: 99.00% N/A N/A % Reduction in Volume: Grade 2 N/A N/A Classification: Medium N/A N/A Exudate A mount: Serosanguineous N/A N/A Exudate Type: red, brown N/A N/A Exudate Color: Large (67-100%) N/A N/A Granulation A mount: Red N/A N/A Granulation Quality: Small (1-33%) N/A N/A Necrotic A mount: Fat Layer (Subcutaneous Tissue): Yes N/A N/A Exposed Structures: Fascia: No Tendon: No Muscle: No Joint: No Bone: No None N/A N/A Epithelialization: Treatment Notes Electronic Signature(s) Signed: 06/02/2023 9:05:28 AM By: Betha Loa Entered By: Betha Loa on 06/01/2023 05:30:20 -------------------------------------------------------------------------------- Multi-Disciplinary Care Plan Details Patient Name: Date of Service: [name], [name] SLO W A. 06/01/2023 8:15 A M Medical Record Number: [id number] Patient Account Number: [id number] Date of Birth/Sex: Treating RN: 15-May-1971 (52  y.o. [name] Primary Care Eily Louvier: [name] Other Clinician: Betha Loa Referring Arjay Jaskiewicz: Treating Aslin Farinas/Extender: RO BSO N, MICHA EL [place] in Treatment: 15 Active Inactive Wound/Skin Impairment Nursing Diagnoses: Knowledge deficit related to ulceration/compromised skin integrity Goals: Patient/caregiver will verbalize understanding of skin care regimen Date Initiated: 03/09/2023 Date Inactivated: 04/13/2023 Target Resolution Date: 04/09/2023 Goal Status: Met Ulcer/skin breakdown will have a volume reduction of 30% by week 4 Date Initiated: 03/09/2023 Date Inactivated: 04/13/2023 Target Resolution Date: 04/09/2023 Goal Status: Met Ulcer/skin breakdown will have a volume reduction of 50% by week 8 Date Initiated: 03/09/2023 Date Inactivated: 05/18/2023 Target Resolution Date: 05/10/2023 EASTON, PFUHL ([id number]) [phone].pdf Page 6 of 9 Goal Status: Met Ulcer/skin breakdown will have a volume reduction of 80% by week 12 Date Initiated: 03/09/2023 Target Resolution Date: 06/09/2023 Goal Status: Active Ulcer/skin breakdown will heal within 14 weeks Date Initiated: 03/09/2023 Target Resolution Date: 07/10/2023 Goal Status: Active Interventions: Assess patient/caregiver ability to obtain necessary supplies Assess patient/caregiver ability to perform ulcer/skin care regimen upon admission and as needed Assess ulceration(s) every visit Notes: Electronic Signature(s) Signed: 06/02/2023 9:05:28 AM By: Betha Loa Signed: 06/27/2023 8:01:26 AM By: [name] RN Entered By: Betha Loa on 06/01/2023 05:52:51 -------------------------------------------------------------------------------- Pain Assessment Details Patient Name: Date of Service: [name], [name] SLO W A. 06/01/2023 8:15 A M Medical Record Number: [id number] Patient Account Number: [id number] Date of Birth/Sex: Treating RN: 09-06-1970 (52 y.o. [name] Primary Care Tasnia Spegal: [name] Other Clinician: Betha Loa Referring Aleanna Menge: Treating Ovidio Steele/Extender: RO BSO N, MICHA EL [place] in Treatment: 15 Active Problems Location of Pain Severity and Description of Pain Patient Has Paino No Site Locations Pain Management and Medication Current Pain Management: Electronic Signature(s) Signed: 06/02/2023 9:05:28 AM By: Betha Loa Signed: 06/27/2023 8:01:26 AM By: [name] RN Entered By: Betha Loa on 06/01/2023 05:16:24 Mccready, Ace A ([id number]) [id number] Page 7 of 9 -------------------------------------------------------------------------------- Patient/Caregiver Education Details Patient Name: Date of Service: [name], [name] SLO W A. 10/2/2024andnbsp8:15 A M Medical Record Number: [id number] Patient Account Number: [id number] Date of Birth/Gender: Treating RN: 04-19-1971 (52 y.o. [name] Primary Care Physician: [name] Other Clinician: Betha Loa Referring Physician: Treating Physician/Extender: RO BSO [name], MICHA EL [place] in Treatment: 15 Education Assessment Education Provided To: Patient Education Topics Provided Wound/Skin Impairment: Handouts: Other: continue wound care as directed Methods: Explain/Verbal Responses: State content correctly Electronic Signature(s) Signed: 06/02/2023 9:05:28 AM By: Betha Loa Entered

## 2023-06-27 NOTE — Progress Notes (Signed)
Number: [id number] Patient Account Number: [id number] Date of Birth/Sex: Treating RN: [birth year] (52 y.o. [name]) [name] Primary Care [name]: [name] Other Clinician: Referring [name]: Treating [name]/Extender: RO BSO N, [name] in Treatment: 16 Wound Status Wound Number: 22 Primary Etiology: Diabetic Wound/Ulcer of the Lower Extremity Wound Location: Left Lower Leg Wound Status: Open Wounding Event: Gradually Appeared Comorbid History:  Hypertension, Type II Diabetes, Neuropathy Date Acquired: 08/30/2022 Weeks Of Treatment: 16 Clustered Wound: No Kissoon, [name] ([id number]) [id number] Page 8 of 9 Photos Wound Measurements Length: (cm) 1.5 Width: (cm) 2 Depth: (cm) 0.1 Area: (cm) 2.356 Volume: (cm) 0.236 % Reduction in Area: 97.4% % Reduction in Volume: 99.6% Epithelialization: None Tunneling: No Undermining: No Wound Description Classification: Grade 2 Exudate Amount: Medium Exudate Type: Serosanguineous Exudate Color: red, brown Foul Odor After Cleansing: No Slough/Fibrino Yes Wound Bed Granulation Amount: Large (67-100%) Exposed Structure Granulation Quality: Red Fascia Exposed: No Necrotic Amount: Small (1-33%) Fat Layer (Subcutaneous Tissue) Exposed: Yes Necrotic Quality: Adherent Slough Tendon Exposed: No Muscle Exposed: No Joint Exposed: No Bone Exposed: No Electronic Signature(s) Signed: 06/27/2023 8:01:26 AM By: [name] Pax RN Entered By: [name] on 06/08/2023 05:12:50 -------------------------------------------------------------------------------- Vitals Details Patient Name: Date of Service: [name], [name] [name] 06/08/2023 8:00 [name] Medical Record Number: [id number] Patient Account Number: [id number] Date of Birth/Sex: Treating RN: 05-Oct-1970 (52 y.o. [name]) [name] Primary Care [name]: [name] Other Clinician: Referring [name]: Treating [name]/Extender: RO BSO N, [name] in Treatment: 16 Vital Signs Time Taken: 08:07 Temperature (F): 98 Height (in): 76 Pulse (bpm): 69 Weight (lbs): 400 Respiratory Rate (breaths/min): 18 Body Mass Index (BMI): 48.7 Blood Pressure (mmHg): 148/74 Reference Range: 80 - 120 mg / dl Electronic Signature(s) [name], [name] ([id number]) [id number] Page 9 of 9 Signed: 06/27/2023 8:01:26 AM By: [name] Pax RN Entered By: [name] on 06/08/2023 05:07:49  [name], [name] ([id number]) [id number] Page 1 of 9 Visit Report for 06/08/2023 Arrival Information Details Patient Name: Date of Service: [name], [name] [place] [name] 06/08/2023 8:00 [name] Medical Record Number: [id number] Patient Account Number: [id number] Date of Birth/Sex: Treating RN: September 21, 1970 (52 y.o. [name]) [name] Primary Care [name]: [name] Other Clinician: Referring [name]: Treating [name]/Extender: RO BSO N, [name] in Treatment: 16 Visit Information History Since Last Visit Added or deleted any medications: No Patient Arrived: Ambulatory Any new allergies or adverse reactions: No Arrival Time: 08:07 Had [name] fall or experienced change in No Accompanied By: self activities of daily living that may affect Transfer Assistance: None risk of falls: Patient Identification Verified: Yes Signs or symptoms of abuse/neglect since last visito No Secondary Verification Process Completed: Yes Hospitalized since last visit: No Patient Requires Transmission-Based Precautions: No Implantable device outside of the clinic excluding No Patient Has Alerts: Yes cellular tissue based products placed in the center Patient Alerts: Patient on Blood Thinner since last visit: ABI R [place] TBI .61 10/11/22 Has Dressing in Place as Prescribed: Yes ABI L  TBI .33 10/11/22 Pain Present Now: No Electronic Signature(s) Signed: 06/27/2023 8:01:26 AM By: [name] Pax RN Entered By: [name] on 06/08/2023 05:07:33 -------------------------------------------------------------------------------- Clinic Level of Care Assessment Details Patient Name: Date of Service: [name], [name] [name] 06/08/2023 8:00 [name] Medical Record Number: [id number] Patient Account Number: [id number] Date of Birth/Sex: Treating RN: 1971/04/23 (52 y.o. [name]) [name] Primary Care Ilaria Much: [name] Other Clinician: Referring [name]: Treating [name]/Extender: RO BSO  N, [name] in Treatment: 16 Clinic Level of Care Assessment Items TOOL 1 Quantity Score []  - 0 Use when EandM and Procedure is performed on INITIAL visit ASSESSMENTS - Nursing Assessment / Reassessment []  - 0 General Physical Exam (combine [name]/ comprehensive assessment (listed just below) when performed on new pt. evals) []  - 0 Comprehensive Assessment (HX, ROS, Risk Assessments, Wounds Hx, etc.) [name], [name] ([id number]) [id number] Page 2 of 9 ASSESSMENTS - Wound and Skin Assessment / Reassessment []  - 0 Dermatologic / Skin Assessment (not related to wound area) ASSESSMENTS - Ostomy and/or Continence Assessment and Care []  - 0 Incontinence Assessment and Management []  - 0 Ostomy Care Assessment and Management (repouching, etc.) PROCESS - Coordination of Care []  - 0 Simple Patient / Family Education for ongoing care []  - 0 Complex (extensive) Patient / Family Education for ongoing care []  - 0 Staff obtains Chiropractor, Records, T Results / Process Orders est []  - 0 Staff telephones HHA, Nursing Homes / Clarify orders / etc []  - 0 Routine Transfer to another Facility (non-emergent condition) []  - 0 Routine Hospital Admission (non-emergent condition) []  - 0 New Admissions / Manufacturing engineer / Ordering NPWT Apligraf, etc. , []  - 0 Emergency Hospital Admission (emergent condition) PROCESS - Special Needs []  - 0 Pediatric / Minor Patient Management []  - 0 Isolation Patient Management []  - 0 Hearing / Language / Visual special needs []  - 0 Assessment of Community assistance (transportation, D/C planning, etc.) []  - 0 Additional assistance / Altered mentation []  - 0 Support Surface(s) Assessment (bed, cushion, seat, etc.) INTERVENTIONS - Miscellaneous []  - 0 External ear exam []  - 0 Patient Transfer (multiple staff / Nurse, adult / Similar devices) []  - 0 Simple Staple / Suture removal (25 or less) []  -  0 Complex Staple / Suture removal (26 or more) []  - 0 Hypo/Hyperglycemic Management (do  not check if billed separately) []  - 0 Ankle / Brachial Index (ABI) - do not check if billed separately Has the patient been seen at the hospital within the last three years: Yes Total Score: 0 Level Of Care: ____ Electronic Signature(s) Signed: 06/27/2023 8:01:26 AM By: [name] Pax RN Entered By: [name] on 06/08/2023 08:14:51 -------------------------------------------------------------------------------- Compression Therapy Details Patient Name: Date of Service: [name], [name] [name] 06/08/2023 8:00 [name] Medical Record Number: [id number] Patient Account Number: [id number] Date of Birth/Sex: Treating RN: Dec 13, 1970 (52 y.o. [name] Primary Care Solveig Fangman: [name] Other Clinician: Referring Luticia Tadros: Treating Sheralyn Pinegar/Extender: RO BSO [name], [name] in Treatment: 16 Nar[place], Brogan [name] ([id number]) [id number] Page 3 of 9 Compression Therapy Performed for Wound Assessment: Wound #22 Left Lower Leg Performed By: Clinician [name] Pax, RN Compression Type: Double Layer Post Procedure Diagnosis Same as Pre-procedure Electronic Signature(s) Signed: 06/08/2023 11:14:37 AM By: [name] Pax RN Entered By: [name] on 06/08/2023 08:14:37 -------------------------------------------------------------------------------- Encounter Discharge Information Details Patient Name: Date of Service: [name], [name] [name] 06/08/2023 8:00 [name] Medical Record Number: [id number] Patient Account Number: [id number] Date of Birth/Sex: Treating RN: April 23, 1971 (52 y.o. [name] Primary Care Yoon Barca: [name] Other Clinician: Referring Plumer Mittelstaedt: Treating Bertil Brickey/Extender: RO BSO [name], [name] in Treatment: 16 Encounter Discharge Information Items Discharge Condition: Stable Ambulatory Status: Ambulatory Discharge  Destination: Home Transportation: Private Auto Accompanied By: self Schedule Follow-up Appointment: Yes Clinical Summary of Care: Electronic Signature(s) Signed: 06/08/2023 11:15:56 AM By: [name] Pax RN Entered By: [name] on 06/08/2023 08:15:55 -------------------------------------------------------------------------------- Lower Extremity Assessment Details Patient Name: Date of Service: [name], [name] [name] 06/08/2023 8:00 [name] Medical Record Number: [id number] Patient Account Number: [id number] Date of Birth/Sex: Treating RN: 09-18-1970 (52 y.o. [name] Primary Care Aloys Hupfer: [name] Other Clinician: Referring Tehila Sokolow: Treating Elnita Surprenant/Extender: RO BSO N, [name] in Treatment: 16 Edema Assessment Assessed: [Left: No] [Right: No] H[Left: OLT, Lc [name] ([id number])] [Right: [id number] Page 4 of 9] Edema: [Left: Ye] [Right: s] Calf Left: Right: Point of Measurement: 40 cm From Medial Instep 50 cm Ankle Left: Right: Point of Measurement: 10 cm From Medial Instep 38 cm Knee To Floor Left: Right: From Medial Instep 50 cm Vascular Assessment Pulses: Dorsalis Pedis Palpable: [Left:Yes] Extremity colors, hair growth, and conditions: Extremity Color: [Left:Hyperpigmented] Hair Growth on Extremity: [Left:No] Temperature of Extremity: [Left:Warm] Capillary Refill: [Left:< 3 seconds] Dependent Rubor: [Left:No] Blanched when Elevated: [Left:No Yes] Toe Nail Assessment Left: Right: Thick: Yes Discolored: Yes Deformed: Yes Improper Length and Hygiene: Yes Electronic Signature(s) Signed: 06/27/2023 8:01:26 AM By: [name] Pax RN Entered By: [name] on 06/08/2023 05:37:32 -------------------------------------------------------------------------------- Multi Wound Chart Details Patient Name: Date of Service: [name], [name] [name] 06/08/2023 8:00 [name] Medical Record Number: [id number] Patient Account  Number: [id number] Date of Birth/Sex: Treating RN: [birth year] (52 y.o. [name]) [name] Primary Care Kaylen Nghiem: [name] Other Clinician: Referring Juan Kissoon: Treating Lindel Marcell/Extender: RO BSO N, [name] in Treatment: 16 Vital Signs Height(in): 76 Pulse(bpm): 69 Weight(lbs): 400 Blood Pressure(mmHg): 148/74 Body Mass Index(BMI): 48.7 Temperature(F): 98 Respiratory Rate(breaths/min): 18 [22:Photos:] [N/[name]:N/[name]] Left Lower Leg N/[name] N/[name] Wound Location: Gradually Appeared N/[name] N/[name] Wounding Event: Diabetic Wound/Ulcer of the Lower N/[name] N/[name] Primary Etiology: Extremity Hypertension, Type II Diabetes, N/[name] N/[name] Comorbid History: Neuropathy 08/30/2022 N/[name] N/[name] Date Acquired: 16 N/[name] N/[name] Weeks of Treatment: Open  [name], [name] ([id number]) [id number] Page 1 of 9 Visit Report for 06/08/2023 Arrival Information Details Patient Name: Date of Service: [name], [name] [place] [name] 06/08/2023 8:00 [name] Medical Record Number: [id number] Patient Account Number: [id number] Date of Birth/Sex: Treating RN: September 21, 1970 (52 y.o. [name]) [name] Primary Care [name]: [name] Other Clinician: Referring [name]: Treating [name]/Extender: RO BSO N, [name] in Treatment: 16 Visit Information History Since Last Visit Added or deleted any medications: No Patient Arrived: Ambulatory Any new allergies or adverse reactions: No Arrival Time: 08:07 Had [name] fall or experienced change in No Accompanied By: self activities of daily living that may affect Transfer Assistance: None risk of falls: Patient Identification Verified: Yes Signs or symptoms of abuse/neglect since last visito No Secondary Verification Process Completed: Yes Hospitalized since last visit: No Patient Requires Transmission-Based Precautions: No Implantable device outside of the clinic excluding No Patient Has Alerts: Yes cellular tissue based products placed in the center Patient Alerts: Patient on Blood Thinner since last visit: ABI R [place] TBI .61 10/11/22 Has Dressing in Place as Prescribed: Yes ABI L  TBI .33 10/11/22 Pain Present Now: No Electronic Signature(s) Signed: 06/27/2023 8:01:26 AM By: [name] Pax RN Entered By: [name] on 06/08/2023 05:07:33 -------------------------------------------------------------------------------- Clinic Level of Care Assessment Details Patient Name: Date of Service: [name], [name] [name] 06/08/2023 8:00 [name] Medical Record Number: [id number] Patient Account Number: [id number] Date of Birth/Sex: Treating RN: 1971/04/23 (52 y.o. [name]) [name] Primary Care Ilaria Much: [name] Other Clinician: Referring [name]: Treating [name]/Extender: RO BSO  N, [name] in Treatment: 16 Clinic Level of Care Assessment Items TOOL 1 Quantity Score []  - 0 Use when EandM and Procedure is performed on INITIAL visit ASSESSMENTS - Nursing Assessment / Reassessment []  - 0 General Physical Exam (combine [name]/ comprehensive assessment (listed just below) when performed on new pt. evals) []  - 0 Comprehensive Assessment (HX, ROS, Risk Assessments, Wounds Hx, etc.) [name], [name] ([id number]) [id number] Page 2 of 9 ASSESSMENTS - Wound and Skin Assessment / Reassessment []  - 0 Dermatologic / Skin Assessment (not related to wound area) ASSESSMENTS - Ostomy and/or Continence Assessment and Care []  - 0 Incontinence Assessment and Management []  - 0 Ostomy Care Assessment and Management (repouching, etc.) PROCESS - Coordination of Care []  - 0 Simple Patient / Family Education for ongoing care []  - 0 Complex (extensive) Patient / Family Education for ongoing care []  - 0 Staff obtains Chiropractor, Records, T Results / Process Orders est []  - 0 Staff telephones HHA, Nursing Homes / Clarify orders / etc []  - 0 Routine Transfer to another Facility (non-emergent condition) []  - 0 Routine Hospital Admission (non-emergent condition) []  - 0 New Admissions / Manufacturing engineer / Ordering NPWT Apligraf, etc. , []  - 0 Emergency Hospital Admission (emergent condition) PROCESS - Special Needs []  - 0 Pediatric / Minor Patient Management []  - 0 Isolation Patient Management []  - 0 Hearing / Language / Visual special needs []  - 0 Assessment of Community assistance (transportation, D/C planning, etc.) []  - 0 Additional assistance / Altered mentation []  - 0 Support Surface(s) Assessment (bed, cushion, seat, etc.) INTERVENTIONS - Miscellaneous []  - 0 External ear exam []  - 0 Patient Transfer (multiple staff / Nurse, adult / Similar devices) []  - 0 Simple Staple / Suture removal (25 or less) []  -  0 Complex Staple / Suture removal (26 or more) []  - 0 Hypo/Hyperglycemic Management (do

## 2023-06-27 NOTE — Progress Notes (Signed)
of Birth/Sex: Treating RN: 05-12-1971 (52 y.o. [name] Primary Care Provider: Maudie [name] Other Clinician: Referring Provider: Treating Provider/Extender: [name], [name] in Treatment: 16 Constitutional Patient is hypertensive.. Pulse regular and within target range for patient.[name] Respirations regular, non-labored and within target range.. Temperature is normal and within the target range for the patient.[name] appears in no distress. Cardiovascular Lymphedema on the left but I think some improvement. Notes Wound exam; left anterior lower leg small circular wound small satellite area. All of the surface of this looks healthy. He has some rolled edges but I did not think this needed debridement today. There was no periwound callus like last week. He edema control is mediocre but I think improved Electronic Signature(s) Signed: 06/08/2023 4:50:17 PM By: Baltazar Najjar MD Entered By: Baltazar Najjar on 06/08/2023 05:41:20 -------------------------------------------------------------------------------- Physician Orders Details Patient Name: Date of Service: [name], [name] 06/08/2023 8:00 [name] Medical Record Number: [id number] Patient Account Number: [id number] Date of Birth/Sex: Treating RN: 06-Jun-1971 (52 y.o. [name]) [name] Primary Care Provider: Maudie [name] Other Clinician: Referring Provider: Treating Provider/Extender: [name] BSO [name], [name] in Treatment: 16 Verbal / Phone Orders: No Diagnosis Coding ICD-10 Coding Code Description [phone] Non-pressure chronic ulcer of other part of left lower leg with fat layer exposed I87.312 Chronic venous hypertension (idiopathic) with  ulcer of left lower extremity I89.0 Lymphedema, not elsewhere classified I87.321 Chronic venous hypertension (idiopathic) with inflammation of right lower extremity E11.622 Type 2 diabetes mellitus with other skin ulcer Follow-up Appointments [name] ([id number]) [id number] Page 5 of 10 Return Appointment in 1 week. Home Health Home Health Company: - [facility] for wound care. May utilize formulary equivalent dressing for wound treatment orders unless otherwise specified. Home Health Nurse may visit PRN to address patients wound care needs. [name] [phone] Change on Monday and Friday. Wound Care Center will change on Wednesday Scheduled days for dressing changes to be completed; exception, patient has scheduled wound care visit that day. **Please direct any NON-WOUND related issues/requests for orders to patient's Primary Care Physician. **If current dressing causes regression in wound condition, may D/C ordered dressing product/s and apply Normal Saline Moist Dressing daily until next Wound Healing Center or Other MD appointment. **Notify Wound Healing Center of regression in wound condition at [phone]. Bathing/ Shower/ Hygiene May shower with wound dressing protected with water repellent cover or cast protector. No tub bath. Anesthetic (Use 'Patient Medications' Section for Anesthetic Order Entry) Lidocaine applied to wound bed Edema Control - Lymphedema / Segmental Compressive Device / Other Optional: One layer of unna paste to top of compression wrap (to act as an anchor). Tubigrip double layer applied - right leg Elevate, Exercise Daily and [name] void Standing for Long Periods of Time. Elevate legs to the level of the heart and pump ankles as often as possible Elevate leg(s) parallel to the floor when sitting. Wound Treatment Wound #22 - Lower Leg Wound Laterality: Left Cleanser: Soap and Water 3 x Per Week/30  Days Discharge Instructions: Gently cleanse wound with antibacterial soap, rinse and pat dry prior to dressing wounds Cleanser: Wound Cleanser 3 x Per Week/30 Days Discharge Instructions: Wash your hands with soap and water. Remove old dressing, discard into plastic bag and place into trash. Cleanse the wound with Wound Cleanser prior to applying [name] clean dressing using gauze sponges, not tissues or cotton balls. Do not scrub or use excessive force.  [name] ([id number]) [id number] Page 1 of 10 Visit Report for 06/08/2023 HPI Details Patient Name: Date of Service: [name], [name] 06/08/2023 8:00 [name] Medical Record Number: [id number] Patient Account Number: [id number] Date of Birth/Sex: Treating RN: 02/10/1971 (52 y.o. [name]) [name] Primary Care Provider: Maudie [name] Other Clinician: Referring Provider: Treating Provider/Extender: [name], [name] in Treatment: 16 History of Present Illness HPI Description: 12/28/17; this is [name] now 52 year old man who is [name] type II diabetic. He was hospitalized from 10/01/17 through 10/19/17. He had an MSSA soft tissue and skin infection. 2 open areas on the left leg were identified he has [name] smaller area on the left medial calf superiorly just below the knee and [name] wound just above the left ankle on the posterior medial aspect. I think both of these were surgical IandD sites when he was in the hospital. He was discharged with [name] wound VAC at that point however this is since been taken off. He follows with Dr. Sampson [name] for the [facility] and he is still on chronic Keflex at 500 twice [name] day. At that time he was hospitalized his hemoglobin A1c was 15.1 however if I'm reading his endocrinologist notes correctly that is improved. He has been following with Dr. Lorretta [name] at [facility] and he has been applying calcium alginate and Unna boots. He has home health changing the dressing. They have also been attempting to get him external compression pumps although the patient is unaware whether they've been approved by insurance at this point. as mentioned he has [name] smaller clean wound on the right lateral calf just below the knee and he has [name] much larger area just above the left ankle medially and posteriorly. Our intake nurse reported greenish purulent looking drainage.the patient did have surgical material sent to pathology in February. This showed chronic  abscess The patient also has lymphedema stage III in the left greater than right lower extremities. He has [name] history of blisters with wounds but these of all were always healed. The patient thinks that the lymphedema may have been present since he was about 52 years old i.e. about 30 years. He does not have graded pressure stockings and has not worn stockings. He does not have [name] distant history of DVT PE or phlebitis. He has not been systemically unwell fever no chills. He states that his Lasix is recently been reduced. He tells me his kidney function is at "30%" and he has been followed by Dr. Thedore Mins of nephrology. At one point he was on Lasix 80 twice [name] day however that's been cut back and he is now on Lasix at 20 twice [name] day. The patient has [name] history of PAD listed in his records although he comes from Dr. Lorretta [name] I don't think is felt to have significant PAD. ABIs in our clinic were noncompressible bilaterally. 01/04/18; patient has [name] large wound on the left lateral lower calf and [name] small wound on the left medial upper calf. He has been to see his nephrologist who changed him to Demadex 40 mg [name] day. I'm hopeful this will help with his systemic fluid overload. He also has stage III lymphedema. Really no change in the 2 wounds since last week 01/11/18; the patient is down 13 pounds. He put his stage III lymphedema left leg in 4K compression last week and there is less edema fluid however we still haven't been able to communicate with home health but  of Birth/Sex: Treating RN: 05-12-1971 (52 y.o. [name] Primary Care Provider: Maudie [name] Other Clinician: Referring Provider: Treating Provider/Extender: [name], [name] in Treatment: 16 Constitutional Patient is hypertensive.. Pulse regular and within target range for patient.[name] Respirations regular, non-labored and within target range.. Temperature is normal and within the target range for the patient.[name] appears in no distress. Cardiovascular Lymphedema on the left but I think some improvement. Notes Wound exam; left anterior lower leg small circular wound small satellite area. All of the surface of this looks healthy. He has some rolled edges but I did not think this needed debridement today. There was no periwound callus like last week. He edema control is mediocre but I think improved Electronic Signature(s) Signed: 06/08/2023 4:50:17 PM By: Baltazar Najjar MD Entered By: Baltazar Najjar on 06/08/2023 05:41:20 -------------------------------------------------------------------------------- Physician Orders Details Patient Name: Date of Service: [name], [name] 06/08/2023 8:00 [name] Medical Record Number: [id number] Patient Account Number: [id number] Date of Birth/Sex: Treating RN: 06-Jun-1971 (52 y.o. [name]) [name] Primary Care Provider: Maudie [name] Other Clinician: Referring Provider: Treating Provider/Extender: [name] BSO [name], [name] in Treatment: 16 Verbal / Phone Orders: No Diagnosis Coding ICD-10 Coding Code Description [phone] Non-pressure chronic ulcer of other part of left lower leg with fat layer exposed I87.312 Chronic venous hypertension (idiopathic) with  ulcer of left lower extremity I89.0 Lymphedema, not elsewhere classified I87.321 Chronic venous hypertension (idiopathic) with inflammation of right lower extremity E11.622 Type 2 diabetes mellitus with other skin ulcer Follow-up Appointments [name] ([id number]) [id number] Page 5 of 10 Return Appointment in 1 week. Home Health Home Health Company: - [facility] for wound care. May utilize formulary equivalent dressing for wound treatment orders unless otherwise specified. Home Health Nurse may visit PRN to address patients wound care needs. [name] [phone] Change on Monday and Friday. Wound Care Center will change on Wednesday Scheduled days for dressing changes to be completed; exception, patient has scheduled wound care visit that day. **Please direct any NON-WOUND related issues/requests for orders to patient's Primary Care Physician. **If current dressing causes regression in wound condition, may D/C ordered dressing product/s and apply Normal Saline Moist Dressing daily until next Wound Healing Center or Other MD appointment. **Notify Wound Healing Center of regression in wound condition at [phone]. Bathing/ Shower/ Hygiene May shower with wound dressing protected with water repellent cover or cast protector. No tub bath. Anesthetic (Use 'Patient Medications' Section for Anesthetic Order Entry) Lidocaine applied to wound bed Edema Control - Lymphedema / Segmental Compressive Device / Other Optional: One layer of unna paste to top of compression wrap (to act as an anchor). Tubigrip double layer applied - right leg Elevate, Exercise Daily and [name] void Standing for Long Periods of Time. Elevate legs to the level of the heart and pump ankles as often as possible Elevate leg(s) parallel to the floor when sitting. Wound Treatment Wound #22 - Lower Leg Wound Laterality: Left Cleanser: Soap and Water 3 x Per Week/30  Days Discharge Instructions: Gently cleanse wound with antibacterial soap, rinse and pat dry prior to dressing wounds Cleanser: Wound Cleanser 3 x Per Week/30 Days Discharge Instructions: Wash your hands with soap and water. Remove old dressing, discard into plastic bag and place into trash. Cleanse the wound with Wound Cleanser prior to applying [name] clean dressing using gauze sponges, not tissues or cotton balls. Do not scrub or use excessive force.  Pat dry using gauze sponges, not tissue or cotton balls. Prim Dressing: Hydrofera Blue Ready Transfer Foam, 2.5x2.5 (in/in) 3 x Per Week/30 Days ary Discharge Instructions: Apply Hydrofera Blue Ready to wound bed as directed Secondary Dressing: Zetuvit Plus 4x4 (in/in) 3 x Per Week/30 Days Secured With: [employer] Dressing, Latex-free, Size 5, Small-Head / Shoulder / Thigh 3 x Per Week/30 Days Discharge Instructions: over wrap for comfort Compression Wrap: Urgo K2, two layer compression system, large 3 x Per Week/30 Days Electronic Signature(s) Signed: 06/08/2023 4:50:17 PM By: Baltazar Najjar MD Signed: 06/27/2023 8:01:26 AM By: [name] Pax RN Previous Signature: 06/08/2023 11:12:30 AM Version By: [name] Pax RN Entered By: [name] on 06/08/2023 08:14:12 -------------------------------------------------------------------------------- Problem List Details Patient Name: Date of Service: [name], [name] 06/08/2023 8:00 [name] Medical Record Number: [id number] Patient Account Number: [id number] Date of Birth/Sex: Treating RN: 1970-12-30 (52 y.o. [name] Primary Care Provider: Maudie [name] Other Clinician: Referring Provider: Treating Provider/Extender: [name] BSO [name], [name] in Treatment: 16 Active Problems [name], [name] ([id number]) [phone].pdf Page 6 of 10 ICD-10 Encounter Code Description Active Date MDM Diagnosis L97.822 Non-pressure chronic ulcer of other part of left  lower leg with fat layer exposed6/19/2024 No Yes I87.312 Chronic venous hypertension (idiopathic) with ulcer of left lower extremity 02/16/2023 No Yes I89.0 Lymphedema, not elsewhere classified 03/23/2023 No Yes I87.321 Chronic venous hypertension (idiopathic) with inflammation of right lower 02/16/2023 No Yes extremity E11.622 Type 2 diabetes mellitus with other skin ulcer 02/16/2023 No Yes Inactive Problems ICD-10 Code Description Active Date Inactive Date L97.522 Non-pressure chronic ulcer of other part of left foot with fat layer exposed 03/16/2023 03/16/2023 Resolved Problems Electronic Signature(s) Signed: 06/08/2023 4:50:17 PM By: Baltazar Najjar MD Entered By: Baltazar Najjar on 06/08/2023 05:38:31 -------------------------------------------------------------------------------- Progress Note Details Patient Name: Date of Service: [name], [name] 06/08/2023 8:00 [name] Medical Record Number: [id number] Patient Account Number: [id number] Date of Birth/Sex: Treating RN: 1971-05-22 (52 y.o. [name] Primary Care Provider: Maudie [name] Other Clinician: Referring Provider: Treating Provider/Extender: [name], [name] in Treatment: 16 Subjective History of Present Illness (HPI) 12/28/17; this is [name] now 52 year old man who is [name] type II diabetic. He was hospitalized from 10/01/17 through 10/19/17. He had an MSSA soft tissue and skin infection. 2 open areas on the left leg were identified he has [name] smaller area on the left medial calf superiorly just below the knee and [name] wound just above the left ankle on the posterior medial aspect. I think both of these were surgical IandD sites when he was in the hospital. He was discharged with [name] wound VAC at that point however this is since been taken off. He follows with Dr. Sampson [name] for the [facility] and he is still on chronic Keflex at 500 twice [name] day. At that time he was hospitalized his hemoglobin A1c was 15.1 however if I'm  reading his endocrinologist notes correctly that is improved. He has been following with Dr. Lorretta [name] at [facility] vascular and he has been applying calcium alginate and Unna boots. He has home health changing the dressing. They have also been attempting to get him external compression pumps although the patient is unaware whether they've been approved by insurance at this point. as mentioned he has [name] smaller clean wound on the right lateral calf just below the knee and he has [name] much larger area just above the left ankle  of Birth/Sex: Treating RN: 05-12-1971 (52 y.o. [name] Primary Care Provider: Maudie [name] Other Clinician: Referring Provider: Treating Provider/Extender: [name], [name] in Treatment: 16 Constitutional Patient is hypertensive.. Pulse regular and within target range for patient.[name] Respirations regular, non-labored and within target range.. Temperature is normal and within the target range for the patient.[name] appears in no distress. Cardiovascular Lymphedema on the left but I think some improvement. Notes Wound exam; left anterior lower leg small circular wound small satellite area. All of the surface of this looks healthy. He has some rolled edges but I did not think this needed debridement today. There was no periwound callus like last week. He edema control is mediocre but I think improved Electronic Signature(s) Signed: 06/08/2023 4:50:17 PM By: Baltazar Najjar MD Entered By: Baltazar Najjar on 06/08/2023 05:41:20 -------------------------------------------------------------------------------- Physician Orders Details Patient Name: Date of Service: [name], [name] 06/08/2023 8:00 [name] Medical Record Number: [id number] Patient Account Number: [id number] Date of Birth/Sex: Treating RN: 06-Jun-1971 (52 y.o. [name]) [name] Primary Care Provider: Maudie [name] Other Clinician: Referring Provider: Treating Provider/Extender: [name] BSO [name], [name] in Treatment: 16 Verbal / Phone Orders: No Diagnosis Coding ICD-10 Coding Code Description [phone] Non-pressure chronic ulcer of other part of left lower leg with fat layer exposed I87.312 Chronic venous hypertension (idiopathic) with  ulcer of left lower extremity I89.0 Lymphedema, not elsewhere classified I87.321 Chronic venous hypertension (idiopathic) with inflammation of right lower extremity E11.622 Type 2 diabetes mellitus with other skin ulcer Follow-up Appointments [name] ([id number]) [id number] Page 5 of 10 Return Appointment in 1 week. Home Health Home Health Company: - [facility] for wound care. May utilize formulary equivalent dressing for wound treatment orders unless otherwise specified. Home Health Nurse may visit PRN to address patients wound care needs. [name] [phone] Change on Monday and Friday. Wound Care Center will change on Wednesday Scheduled days for dressing changes to be completed; exception, patient has scheduled wound care visit that day. **Please direct any NON-WOUND related issues/requests for orders to patient's Primary Care Physician. **If current dressing causes regression in wound condition, may D/C ordered dressing product/s and apply Normal Saline Moist Dressing daily until next Wound Healing Center or Other MD appointment. **Notify Wound Healing Center of regression in wound condition at [phone]. Bathing/ Shower/ Hygiene May shower with wound dressing protected with water repellent cover or cast protector. No tub bath. Anesthetic (Use 'Patient Medications' Section for Anesthetic Order Entry) Lidocaine applied to wound bed Edema Control - Lymphedema / Segmental Compressive Device / Other Optional: One layer of unna paste to top of compression wrap (to act as an anchor). Tubigrip double layer applied - right leg Elevate, Exercise Daily and [name] void Standing for Long Periods of Time. Elevate legs to the level of the heart and pump ankles as often as possible Elevate leg(s) parallel to the floor when sitting. Wound Treatment Wound #22 - Lower Leg Wound Laterality: Left Cleanser: Soap and Water 3 x Per Week/30  Days Discharge Instructions: Gently cleanse wound with antibacterial soap, rinse and pat dry prior to dressing wounds Cleanser: Wound Cleanser 3 x Per Week/30 Days Discharge Instructions: Wash your hands with soap and water. Remove old dressing, discard into plastic bag and place into trash. Cleanse the wound with Wound Cleanser prior to applying [name] clean dressing using gauze sponges, not tissues or cotton balls. Do not scrub or use excessive force.  [name] ([id number]) [id number] Page 1 of 10 Visit Report for 06/08/2023 HPI Details Patient Name: Date of Service: [name], [name] 06/08/2023 8:00 [name] Medical Record Number: [id number] Patient Account Number: [id number] Date of Birth/Sex: Treating RN: 02/10/1971 (52 y.o. [name]) [name] Primary Care Provider: Maudie [name] Other Clinician: Referring Provider: Treating Provider/Extender: [name], [name] in Treatment: 16 History of Present Illness HPI Description: 12/28/17; this is [name] now 52 year old man who is [name] type II diabetic. He was hospitalized from 10/01/17 through 10/19/17. He had an MSSA soft tissue and skin infection. 2 open areas on the left leg were identified he has [name] smaller area on the left medial calf superiorly just below the knee and [name] wound just above the left ankle on the posterior medial aspect. I think both of these were surgical IandD sites when he was in the hospital. He was discharged with [name] wound VAC at that point however this is since been taken off. He follows with Dr. Sampson [name] for the [facility] and he is still on chronic Keflex at 500 twice [name] day. At that time he was hospitalized his hemoglobin A1c was 15.1 however if I'm reading his endocrinologist notes correctly that is improved. He has been following with Dr. Lorretta [name] at [facility] and he has been applying calcium alginate and Unna boots. He has home health changing the dressing. They have also been attempting to get him external compression pumps although the patient is unaware whether they've been approved by insurance at this point. as mentioned he has [name] smaller clean wound on the right lateral calf just below the knee and he has [name] much larger area just above the left ankle medially and posteriorly. Our intake nurse reported greenish purulent looking drainage.the patient did have surgical material sent to pathology in February. This showed chronic  abscess The patient also has lymphedema stage III in the left greater than right lower extremities. He has [name] history of blisters with wounds but these of all were always healed. The patient thinks that the lymphedema may have been present since he was about 52 years old i.e. about 30 years. He does not have graded pressure stockings and has not worn stockings. He does not have [name] distant history of DVT PE or phlebitis. He has not been systemically unwell fever no chills. He states that his Lasix is recently been reduced. He tells me his kidney function is at "30%" and he has been followed by Dr. Thedore Mins of nephrology. At one point he was on Lasix 80 twice [name] day however that's been cut back and he is now on Lasix at 20 twice [name] day. The patient has [name] history of PAD listed in his records although he comes from Dr. Lorretta [name] I don't think is felt to have significant PAD. ABIs in our clinic were noncompressible bilaterally. 01/04/18; patient has [name] large wound on the left lateral lower calf and [name] small wound on the left medial upper calf. He has been to see his nephrologist who changed him to Demadex 40 mg [name] day. I'm hopeful this will help with his systemic fluid overload. He also has stage III lymphedema. Really no change in the 2 wounds since last week 01/11/18; the patient is down 13 pounds. He put his stage III lymphedema left leg in 4K compression last week and there is less edema fluid however we still haven't been able to communicate with home health but  [name] ([id number]) [id number] Page 1 of 10 Visit Report for 06/08/2023 HPI Details Patient Name: Date of Service: [name], [name] 06/08/2023 8:00 [name] Medical Record Number: [id number] Patient Account Number: [id number] Date of Birth/Sex: Treating RN: 02/10/1971 (52 y.o. [name]) [name] Primary Care Provider: Maudie [name] Other Clinician: Referring Provider: Treating Provider/Extender: [name], [name] in Treatment: 16 History of Present Illness HPI Description: 12/28/17; this is [name] now 52 year old man who is [name] type II diabetic. He was hospitalized from 10/01/17 through 10/19/17. He had an MSSA soft tissue and skin infection. 2 open areas on the left leg were identified he has [name] smaller area on the left medial calf superiorly just below the knee and [name] wound just above the left ankle on the posterior medial aspect. I think both of these were surgical IandD sites when he was in the hospital. He was discharged with [name] wound VAC at that point however this is since been taken off. He follows with Dr. Sampson [name] for the [facility] and he is still on chronic Keflex at 500 twice [name] day. At that time he was hospitalized his hemoglobin A1c was 15.1 however if I'm reading his endocrinologist notes correctly that is improved. He has been following with Dr. Lorretta [name] at [facility] and he has been applying calcium alginate and Unna boots. He has home health changing the dressing. They have also been attempting to get him external compression pumps although the patient is unaware whether they've been approved by insurance at this point. as mentioned he has [name] smaller clean wound on the right lateral calf just below the knee and he has [name] much larger area just above the left ankle medially and posteriorly. Our intake nurse reported greenish purulent looking drainage.the patient did have surgical material sent to pathology in February. This showed chronic  abscess The patient also has lymphedema stage III in the left greater than right lower extremities. He has [name] history of blisters with wounds but these of all were always healed. The patient thinks that the lymphedema may have been present since he was about 52 years old i.e. about 30 years. He does not have graded pressure stockings and has not worn stockings. He does not have [name] distant history of DVT PE or phlebitis. He has not been systemically unwell fever no chills. He states that his Lasix is recently been reduced. He tells me his kidney function is at "30%" and he has been followed by Dr. Thedore Mins of nephrology. At one point he was on Lasix 80 twice [name] day however that's been cut back and he is now on Lasix at 20 twice [name] day. The patient has [name] history of PAD listed in his records although he comes from Dr. Lorretta [name] I don't think is felt to have significant PAD. ABIs in our clinic were noncompressible bilaterally. 01/04/18; patient has [name] large wound on the left lateral lower calf and [name] small wound on the left medial upper calf. He has been to see his nephrologist who changed him to Demadex 40 mg [name] day. I'm hopeful this will help with his systemic fluid overload. He also has stage III lymphedema. Really no change in the 2 wounds since last week 01/11/18; the patient is down 13 pounds. He put his stage III lymphedema left leg in 4K compression last week and there is less edema fluid however we still haven't been able to communicate with home health but  Pat dry using gauze sponges, not tissue or cotton balls. Prim Dressing: Hydrofera Blue Ready Transfer Foam, 2.5x2.5 (in/in) 3 x Per Week/30 Days ary Discharge Instructions: Apply Hydrofera Blue Ready to wound bed as directed Secondary Dressing: Zetuvit Plus 4x4 (in/in) 3 x Per Week/30 Days Secured With: Dole Food Dressing, Latex-free, Size 5, Small-Head / Shoulder / Thigh 3 x Per Week/30 Days Discharge Instructions: over wrap for comfort Compression Wrap: Urgo K2, two layer compression system, large 3 x Per Week/30 Days Electronic Signature(s) Signed: 06/08/2023 4:50:17 PM By: Baltazar Najjar MD Signed: 06/27/2023 8:01:26 AM By: [name] Pax RN Previous Signature: 06/08/2023 11:12:30 AM Version By: [name] Pax RN Entered By: [name] on 06/08/2023 08:14:12 -------------------------------------------------------------------------------- Problem List Details Patient Name: Date of Service: [name], [name] 06/08/2023 8:00 [name] Medical Record Number: [id number] Patient Account Number: [id number] Date of Birth/Sex: Treating RN: 1970-12-30 (52 y.o. [name] Primary Care Provider: Maudie [name] Other Clinician: Referring Provider: Treating Provider/Extender: [name] BSO [name], [name] in Treatment: 16 Active Problems [name], [name] ([id number]) [phone].pdf Page 6 of 10 ICD-10 Encounter Code Description Active Date MDM Diagnosis L97.822 Non-pressure chronic ulcer of other part of left  lower leg with fat layer exposed6/19/2024 No Yes I87.312 Chronic venous hypertension (idiopathic) with ulcer of left lower extremity 02/16/2023 No Yes I89.0 Lymphedema, not elsewhere classified 03/23/2023 No Yes I87.321 Chronic venous hypertension (idiopathic) with inflammation of right lower 02/16/2023 No Yes extremity E11.622 Type 2 diabetes mellitus with other skin ulcer 02/16/2023 No Yes Inactive Problems ICD-10 Code Description Active Date Inactive Date L97.522 Non-pressure chronic ulcer of other part of left foot with fat layer exposed 03/16/2023 03/16/2023 Resolved Problems Electronic Signature(s) Signed: 06/08/2023 4:50:17 PM By: Baltazar Najjar MD Entered By: Baltazar Najjar on 06/08/2023 05:38:31 -------------------------------------------------------------------------------- Progress Note Details Patient Name: Date of Service: [name], [name] 06/08/2023 8:00 [name] Medical Record Number: [id number] Patient Account Number: [id number] Date of Birth/Sex: Treating RN: 1971-05-22 (52 y.o. [name] Primary Care Provider: Maudie [name] Other Clinician: Referring Provider: Treating Provider/Extender: [name], [name] in Treatment: 16 Subjective History of Present Illness (HPI) 12/28/17; this is [name] now 52 year old man who is [name] type II diabetic. He was hospitalized from 10/01/17 through 10/19/17. He had an MSSA soft tissue and skin infection. 2 open areas on the left leg were identified he has [name] smaller area on the left medial calf superiorly just below the knee and [name] wound just above the left ankle on the posterior medial aspect. I think both of these were surgical IandD sites when he was in the hospital. He was discharged with [name] wound VAC at that point however this is since been taken off. He follows with Dr. Sampson [name] for the [facility] and he is still on chronic Keflex at 500 twice [name] day. At that time he was hospitalized his hemoglobin A1c was 15.1 however if I'm  reading his endocrinologist notes correctly that is improved. He has been following with Dr. [facility]retta [name] at [facility] and he has been applying calcium alginate and Unna boots. He has home health changing the dressing. They have also been attempting to get him external compression pumps although the patient is unaware whether they've been approved by insurance at this point. as mentioned he has [name] smaller clean wound on the right lateral calf just below the knee and he has [name] much larger area just above the left ankle  he would require when the wound closes. He is using Tubigrip like material on the right leg I do not think that is going to be sufficient for the left my thought would be [name] external compression stocking. Electronic Signature(s) Signed: 06/08/2023 4:50:17 PM By: Baltazar Najjar MD Entered By: Baltazar Najjar on 06/08/2023 05:42:49 -------------------------------------------------------------------------------- SuperBill Details Patient Name: Date of Service: [name], [name] 06/08/2023 Medical Record Number: [id number] Patient Account Number: [id number] Date of Birth/Sex: Treating RN: 08/22/1971 (52 y.o. [name]) [name] Primary Care Provider: Maudie [name] Other Clinician: Referring Provider: Treating Provider/Extender: Chauncey Mann, [name] in Treatment: 16 Diagnosis Coding ICD-10 Codes Code Description [phone] Non-pressure chronic ulcer of other part of left lower leg with fat layer exposed I87.312 Chronic venous hypertension (idiopathic) with ulcer of left lower extremity I89.0 Lymphedema, not elsewhere classified I87.321 Chronic venous hypertension (idiopathic) with inflammation of right  lower extremity E11.622 Type 2 diabetes mellitus with other skin ulcer Facility Procedures : CPT4 Code: 91478295 Description: (Facility Use Only) 29581LT - APPLY MULTLAY COMPRS LWR [name] LEG Modifier: Quantity: 1 Physician Procedures : CPT4 Code Description Modifier [id number] - WC PHYS LEVEL 3 - EST PT ICD-10 Diagnosis Description L97.822 Non-pressure chronic ulcer of other part of left lower leg with fat layer exposed I87.312 Chronic venous hypertension (idiopathic) with ulcer  of left lower extremity I89.0 Lymphedema, not elsewhere classified Quantity: 1 Electronic Signature(s) Signed: 06/08/2023 11:15:02 AM By: [name] Pax RN Signed: 06/08/2023 4:50:17 PM By: Baltazar Najjar MD Entered By: [name] on 06/08/2023 08:15:02  he would require when the wound closes. He is using Tubigrip like material on the right leg I do not think that is going to be sufficient for the left my thought would be [name] external compression stocking. Electronic Signature(s) Signed: 06/08/2023 4:50:17 PM By: Baltazar Najjar MD Entered By: Baltazar Najjar on 06/08/2023 05:42:49 -------------------------------------------------------------------------------- SuperBill Details Patient Name: Date of Service: [name], [name] 06/08/2023 Medical Record Number: [id number] Patient Account Number: [id number] Date of Birth/Sex: Treating RN: 08/22/1971 (52 y.o. [name]) [name] Primary Care Provider: Maudie [name] Other Clinician: Referring Provider: Treating Provider/Extender: Chauncey Mann, [name] in Treatment: 16 Diagnosis Coding ICD-10 Codes Code Description [phone] Non-pressure chronic ulcer of other part of left lower leg with fat layer exposed I87.312 Chronic venous hypertension (idiopathic) with ulcer of left lower extremity I89.0 Lymphedema, not elsewhere classified I87.321 Chronic venous hypertension (idiopathic) with inflammation of right  lower extremity E11.622 Type 2 diabetes mellitus with other skin ulcer Facility Procedures : CPT4 Code: 91478295 Description: (Facility Use Only) 29581LT - APPLY MULTLAY COMPRS LWR [name] LEG Modifier: Quantity: 1 Physician Procedures : CPT4 Code Description Modifier [id number] 99213 - WC PHYS LEVEL 3 - EST PT ICD-10 Diagnosis Description L97.822 Non-pressure chronic ulcer of other part of left lower leg with fat layer exposed I87.312 Chronic venous hypertension (idiopathic) with ulcer  of left lower extremity I89.0 Lymphedema, not elsewhere classified Quantity: 1 Electronic Signature(s) Signed: 06/08/2023 11:15:02 AM By: [name] Pax RN Signed: 06/08/2023 4:50:17 PM By: Baltazar Najjar MD Entered By: [name] on 06/08/2023 08:15:02  Pat dry using gauze sponges, not tissue or cotton balls. Prim Dressing: Hydrofera Blue Ready Transfer Foam, 2.5x2.5 (in/in) 3 x Per Week/30 Days ary Discharge Instructions: Apply Hydrofera Blue Ready to wound bed as directed Secondary Dressing: Zetuvit Plus 4x4 (in/in) 3 x Per Week/30 Days Secured With: Dole Food Dressing, Latex-free, Size 5, Small-Head / Shoulder / Thigh 3 x Per Week/30 Days Discharge Instructions: over wrap for comfort Compression Wrap: Urgo K2, two layer compression system, large 3 x Per Week/30 Days Electronic Signature(s) Signed: 06/08/2023 4:50:17 PM By: Baltazar Najjar MD Signed: 06/27/2023 8:01:26 AM By: [name] Pax RN Previous Signature: 06/08/2023 11:12:30 AM Version By: [name] Pax RN Entered By: [name] on 06/08/2023 08:14:12 -------------------------------------------------------------------------------- Problem List Details Patient Name: Date of Service: [name], [name] 06/08/2023 8:00 [name] Medical Record Number: [id number] Patient Account Number: [id number] Date of Birth/Sex: Treating RN: 1970-12-30 (52 y.o. [name] Primary Care Provider: Maudie [name] Other Clinician: Referring Provider: Treating Provider/Extender: [name] BSO [name], [name] in Treatment: 16 Active Problems [name], [name] ([id number]) [phone].pdf Page 6 of 10 ICD-10 Encounter Code Description Active Date MDM Diagnosis L97.822 Non-pressure chronic ulcer of other part of left  lower leg with fat layer exposed6/19/2024 No Yes I87.312 Chronic venous hypertension (idiopathic) with ulcer of left lower extremity 02/16/2023 No Yes I89.0 Lymphedema, not elsewhere classified 03/23/2023 No Yes I87.321 Chronic venous hypertension (idiopathic) with inflammation of right lower 02/16/2023 No Yes extremity E11.622 Type 2 diabetes mellitus with other skin ulcer 02/16/2023 No Yes Inactive Problems ICD-10 Code Description Active Date Inactive Date L97.522 Non-pressure chronic ulcer of other part of left foot with fat layer exposed 03/16/2023 03/16/2023 Resolved Problems Electronic Signature(s) Signed: 06/08/2023 4:50:17 PM By: Baltazar Najjar MD Entered By: Baltazar Najjar on 06/08/2023 05:38:31 -------------------------------------------------------------------------------- Progress Note Details Patient Name: Date of Service: [name], [name] 06/08/2023 8:00 [name] Medical Record Number: [id number] Patient Account Number: [id number] Date of Birth/Sex: Treating RN: 1971-05-22 (52 y.o. [name] Primary Care Provider: Maudie [name] Other Clinician: Referring Provider: Treating Provider/Extender: [name], [name] in Treatment: 16 Subjective History of Present Illness (HPI) 12/28/17; this is [name] now 52 year old man who is [name] type II diabetic. He was hospitalized from 10/01/17 through 10/19/17. He had an MSSA soft tissue and skin infection. 2 open areas on the left leg were identified he has [name] smaller area on the left medial calf superiorly just below the knee and [name] wound just above the left ankle on the posterior medial aspect. I think both of these were surgical IandD sites when he was in the hospital. He was discharged with [name] wound VAC at that point however this is since been taken off. He follows with Dr. Sampson [name] for the [facility] and he is still on chronic Keflex at 500 twice [name] day. At that time he was hospitalized his hemoglobin A1c was 15.1 however if I'm  reading his endocrinologist notes correctly that is improved. He has been following with Dr. Lorretta [name] at [facility] and he has been applying calcium alginate and Unna boots. He has home health changing the dressing. They have also been attempting to get him external compression pumps although the patient is unaware whether they've been approved by insurance at this point. as mentioned he has [name] smaller clean wound on the right lateral calf just below the knee and he has [name] much larger area just above the left ankle  [name] ([id number]) [id number] Page 1 of 10 Visit Report for 06/08/2023 HPI Details Patient Name: Date of Service: [name], [name] 06/08/2023 8:00 [name] Medical Record Number: [id number] Patient Account Number: [id number] Date of Birth/Sex: Treating RN: 02/10/1971 (52 y.o. [name]) [name] Primary Care Provider: Maudie [name] Other Clinician: Referring Provider: Treating Provider/Extender: [name], [name] in Treatment: 16 History of Present Illness HPI Description: 12/28/17; this is [name] now 52 year old man who is [name] type II diabetic. He was hospitalized from 10/01/17 through 10/19/17. He had an MSSA soft tissue and skin infection. 2 open areas on the left leg were identified he has [name] smaller area on the left medial calf superiorly just below the knee and [name] wound just above the left ankle on the posterior medial aspect. I think both of these were surgical IandD sites when he was in the hospital. He was discharged with [name] wound VAC at that point however this is since been taken off. He follows with Dr. Sampson [name] for the [facility] and he is still on chronic Keflex at 500 twice [name] day. At that time he was hospitalized his hemoglobin A1c was 15.1 however if I'm reading his endocrinologist notes correctly that is improved. He has been following with Dr. Lorretta [name] at [facility] and he has been applying calcium alginate and Unna boots. He has home health changing the dressing. They have also been attempting to get him external compression pumps although the patient is unaware whether they've been approved by insurance at this point. as mentioned he has [name] smaller clean wound on the right lateral calf just below the knee and he has [name] much larger area just above the left ankle medially and posteriorly. Our intake nurse reported greenish purulent looking drainage.the patient did have surgical material sent to pathology in February. This showed chronic  abscess The patient also has lymphedema stage III in the left greater than right lower extremities. He has [name] history of blisters with wounds but these of all were always healed. The patient thinks that the lymphedema may have been present since he was about 52 years old i.e. about 30 years. He does not have graded pressure stockings and has not worn stockings. He does not have [name] distant history of DVT PE or phlebitis. He has not been systemically unwell fever no chills. He states that his Lasix is recently been reduced. He tells me his kidney function is at "30%" and he has been followed by Dr. Thedore Mins of nephrology. At one point he was on Lasix 80 twice [name] day however that's been cut back and he is now on Lasix at 20 twice [name] day. The patient has [name] history of PAD listed in his records although he comes from Dr. Lorretta [name] I don't think is felt to have significant PAD. ABIs in our clinic were noncompressible bilaterally. 01/04/18; patient has [name] large wound on the left lateral lower calf and [name] small wound on the left medial upper calf. He has been to see his nephrologist who changed him to Demadex 40 mg [name] day. I'm hopeful this will help with his systemic fluid overload. He also has stage III lymphedema. Really no change in the 2 wounds since last week 01/11/18; the patient is down 13 pounds. He put his stage III lymphedema left leg in 4K compression last week and there is less edema fluid however we still haven't been able to communicate with home health but  of Birth/Sex: Treating RN: 05-12-1971 (52 y.o. [name] Primary Care Provider: Maudie [name] Other Clinician: Referring Provider: Treating Provider/Extender: [name], [name] in Treatment: 16 Constitutional Patient is hypertensive.. Pulse regular and within target range for patient.[name] Respirations regular, non-labored and within target range.. Temperature is normal and within the target range for the patient.[name] appears in no distress. Cardiovascular Lymphedema on the left but I think some improvement. Notes Wound exam; left anterior lower leg small circular wound small satellite area. All of the surface of this looks healthy. He has some rolled edges but I did not think this needed debridement today. There was no periwound callus like last week. He edema control is mediocre but I think improved Electronic Signature(s) Signed: 06/08/2023 4:50:17 PM By: Baltazar Najjar MD Entered By: Baltazar Najjar on 06/08/2023 05:41:20 -------------------------------------------------------------------------------- Physician Orders Details Patient Name: Date of Service: [name], [name] 06/08/2023 8:00 [name] Medical Record Number: [id number] Patient Account Number: [id number] Date of Birth/Sex: Treating RN: 06-Jun-1971 (52 y.o. [name]) [name] Primary Care Provider: Maudie [name] Other Clinician: Referring Provider: Treating Provider/Extender: [name] BSO [name], [name] in Treatment: 16 Verbal / Phone Orders: No Diagnosis Coding ICD-10 Coding Code Description [phone] Non-pressure chronic ulcer of other part of left lower leg with fat layer exposed I87.312 Chronic venous hypertension (idiopathic) with  ulcer of left lower extremity I89.0 Lymphedema, not elsewhere classified I87.321 Chronic venous hypertension (idiopathic) with inflammation of right lower extremity E11.622 Type 2 diabetes mellitus with other skin ulcer Follow-up Appointments [name] ([id number]) [id number] Page 5 of 10 Return Appointment in 1 week. Home Health Home Health Company: - [facility] for wound care. May utilize formulary equivalent dressing for wound treatment orders unless otherwise specified. Home Health Nurse may visit PRN to address patients wound care needs. [name] [phone] Change on Monday and Friday. Wound Care Center will change on Wednesday Scheduled days for dressing changes to be completed; exception, patient has scheduled wound care visit that day. **Please direct any NON-WOUND related issues/requests for orders to patient's Primary Care Physician. **If current dressing causes regression in wound condition, may D/C ordered dressing product/s and apply Normal Saline Moist Dressing daily until next Wound Healing Center or Other MD appointment. **Notify Wound Healing Center of regression in wound condition at [phone]. Bathing/ Shower/ Hygiene May shower with wound dressing protected with water repellent cover or cast protector. No tub bath. Anesthetic (Use 'Patient Medications' Section for Anesthetic Order Entry) Lidocaine applied to wound bed Edema Control - Lymphedema / Segmental Compressive Device / Other Optional: One layer of unna paste to top of compression wrap (to act as an anchor). Tubigrip double layer applied - right leg Elevate, Exercise Daily and [name] void Standing for Long Periods of Time. Elevate legs to the level of the heart and pump ankles as often as possible Elevate leg(s) parallel to the floor when sitting. Wound Treatment Wound #22 - Lower Leg Wound Laterality: Left Cleanser: Soap and Water 3 x Per Week/30  Days Discharge Instructions: Gently cleanse wound with antibacterial soap, rinse and pat dry prior to dressing wounds Cleanser: Wound Cleanser 3 x Per Week/30 Days Discharge Instructions: Wash your hands with soap and water. Remove old dressing, discard into plastic bag and place into trash. Cleanse the wound with Wound Cleanser prior to applying [name] clean dressing using gauze sponges, not tissues or cotton balls. Do not scrub or use excessive force.  [name] ([id number]) [id number] Page 1 of 10 Visit Report for 06/08/2023 HPI Details Patient Name: Date of Service: [name], [name] 06/08/2023 8:00 [name] Medical Record Number: [id number] Patient Account Number: [id number] Date of Birth/Sex: Treating RN: 02/10/1971 (52 y.o. [name]) [name] Primary Care Provider: Maudie [name] Other Clinician: Referring Provider: Treating Provider/Extender: [name], [name] in Treatment: 16 History of Present Illness HPI Description: 12/28/17; this is [name] now 52 year old man who is [name] type II diabetic. He was hospitalized from 10/01/17 through 10/19/17. He had an MSSA soft tissue and skin infection. 2 open areas on the left leg were identified he has [name] smaller area on the left medial calf superiorly just below the knee and [name] wound just above the left ankle on the posterior medial aspect. I think both of these were surgical IandD sites when he was in the hospital. He was discharged with [name] wound VAC at that point however this is since been taken off. He follows with Dr. Sampson [name] for the [facility] and he is still on chronic Keflex at 500 twice [name] day. At that time he was hospitalized his hemoglobin A1c was 15.1 however if I'm reading his endocrinologist notes correctly that is improved. He has been following with [name] [facility] and he has been applying calcium alginate and Unna boots. He has home health changing the dressing. They have also been attempting to get him external compression pumps although the patient is unaware whether they've been approved by insurance at this point. as mentioned he has [name] smaller clean wound on the right lateral calf just below the knee and he has [name] much larger area just above the left ankle medially and posteriorly. Our intake nurse reported greenish purulent looking drainage.the patient did have surgical material sent to pathology in February. This showed chronic  abscess The patient also has lymphedema stage III in the left greater than right lower extremities. He has [name] history of blisters with wounds but these of all were always healed. The patient thinks that the lymphedema may have been present since he was about 52 years old i.e. about 30 years. He does not have graded pressure stockings and has not worn stockings. He does not have [name] distant history of DVT PE or phlebitis. He has not been systemically unwell fever no chills. He states that his Lasix is recently been reduced. He tells me his kidney function is at "30%" and he has been followed by Dr. Thedore Mins of nephrology. At one point he was on Lasix 80 twice [name] day however that's been cut back and he is now on Lasix at 20 twice [name] day. The patient has [name] history of PAD listed in his records although he comes from Dr. Lorretta [name] I don't think is felt to have significant PAD. ABIs in our clinic were noncompressible bilaterally. 01/04/18; patient has [name] large wound on the left lateral lower calf and [name] small wound on the left medial upper calf. He has been to see his nephrologist who changed him to Demadex 40 mg [name] day. I'm hopeful this will help with his systemic fluid overload. He also has stage III lymphedema. Really no change in the 2 wounds since last week 01/11/18; the patient is down 13 pounds. He put his stage III lymphedema left leg in 4K compression last week and there is less edema fluid however we still haven't been able to communicate with home health but  he would require when the wound closes. He is using Tubigrip like material on the right leg I do not think that is going to be sufficient for the left my thought would be [name] external compression stocking. Electronic Signature(s) Signed: 06/08/2023 4:50:17 PM By: Baltazar Najjar MD Entered By: Baltazar Najjar on 06/08/2023 05:42:49 -------------------------------------------------------------------------------- SuperBill Details Patient Name: Date of Service: [name], [name] 06/08/2023 Medical Record Number: [id number] Patient Account Number: [id number] Date of Birth/Sex: Treating RN: 08/22/1971 (52 y.o. [name]) [name] Primary Care Provider: Maudie [name] Other Clinician: Referring Provider: Treating Provider/Extender: Chauncey Mann, [name] in Treatment: 16 Diagnosis Coding ICD-10 Codes Code Description [phone] Non-pressure chronic ulcer of other part of left lower leg with fat layer exposed I87.312 Chronic venous hypertension (idiopathic) with ulcer of left lower extremity I89.0 Lymphedema, not elsewhere classified I87.321 Chronic venous hypertension (idiopathic) with inflammation of right  lower extremity E11.622 Type 2 diabetes mellitus with other skin ulcer Facility Procedures : CPT4 Code: 91478295 Description: (Facility Use Only) 29581LT - APPLY MULTLAY COMPRS LWR [name] LEG Modifier: Quantity: 1 Physician Procedures : CPT4 Code Description Modifier [id number] - WC PHYS LEVEL 3 - EST PT ICD-10 Diagnosis Description L97.822 Non-pressure chronic ulcer of other part of left lower leg with fat layer exposed I87.312 Chronic venous hypertension (idiopathic) with ulcer  of left lower extremity I89.0 Lymphedema, not elsewhere classified Quantity: 1 Electronic Signature(s) Signed: 06/08/2023 11:15:02 AM By: [name] Pax RN Signed: 06/08/2023 4:50:17 PM By: Baltazar Najjar MD Entered By: [name] on 06/08/2023 08:15:02  he would require when the wound closes. He is using Tubigrip like material on the right leg I do not think that is going to be sufficient for the left my thought would be [name] external compression stocking. Electronic Signature(s) Signed: 06/08/2023 4:50:17 PM By: Baltazar Najjar MD Entered By: Baltazar Najjar on 06/08/2023 05:42:49 -------------------------------------------------------------------------------- SuperBill Details Patient Name: Date of Service: [name], [name] 06/08/2023 Medical Record Number: [id number] Patient Account Number: [id number] Date of Birth/Sex: Treating RN: 08/22/1971 (52 y.o. [name]) [name] Primary Care Provider: Maudie [name] Other Clinician: Referring Provider: Treating Provider/Extender: Chauncey Mann, [name] in Treatment: 16 Diagnosis Coding ICD-10 Codes Code Description [phone] Non-pressure chronic ulcer of other part of left lower leg with fat layer exposed I87.312 Chronic venous hypertension (idiopathic) with ulcer of left lower extremity I89.0 Lymphedema, not elsewhere classified I87.321 Chronic venous hypertension (idiopathic) with inflammation of right  lower extremity E11.622 Type 2 diabetes mellitus with other skin ulcer Facility Procedures : CPT4 Code: 91478295 Description: (Facility Use Only) 29581LT - APPLY MULTLAY COMPRS LWR [name] LEG Modifier: Quantity: 1 Physician Procedures : CPT4 Code Description Modifier [id number] 99213 - WC PHYS LEVEL 3 - EST PT ICD-10 Diagnosis Description L97.822 Non-pressure chronic ulcer of other part of left lower leg with fat layer exposed I87.312 Chronic venous hypertension (idiopathic) with ulcer  of left lower extremity I89.0 Lymphedema, not elsewhere classified Quantity: 1 Electronic Signature(s) Signed: 06/08/2023 11:15:02 AM By: [name] Pax RN Signed: 06/08/2023 4:50:17 PM By: Baltazar Najjar MD Entered By: [name] on 06/08/2023 08:15:02  Pat dry using gauze sponges, not tissue or cotton balls. Prim Dressing: Hydrofera Blue Ready Transfer Foam, 2.5x2.5 (in/in) 3 x Per Week/30 Days ary Discharge Instructions: Apply Hydrofera Blue Ready to wound bed as directed Secondary Dressing: Zetuvit Plus 4x4 (in/in) 3 x Per Week/30 Days Secured With: Dole Food Dressing, Latex-free, Size 5, Small-Head / Shoulder / Thigh 3 x Per Week/30 Days Discharge Instructions: over wrap for comfort Compression Wrap: Urgo K2, two layer compression system, large 3 x Per Week/30 Days Electronic Signature(s) Signed: 06/08/2023 4:50:17 PM By: Baltazar Najjar MD Signed: 06/27/2023 8:01:26 AM By: [name] Pax RN Previous Signature: 06/08/2023 11:12:30 AM Version By: [name] Pax RN Entered By: [name] on 06/08/2023 08:14:12 -------------------------------------------------------------------------------- Problem List Details Patient Name: Date of Service: [name], [name] 06/08/2023 8:00 [name] Medical Record Number: [id number] Patient Account Number: [id number] Date of Birth/Sex: Treating RN: 1970-12-30 (52 y.o. [name] Primary Care Provider: Maudie [name] Other Clinician: Referring Provider: Treating Provider/Extender: [name] BSO [name], [name] in Treatment: 16 Active Problems [name], [name] ([id number]) [phone].pdf Page 6 of 10 ICD-10 Encounter Code Description Active Date MDM Diagnosis L97.822 Non-pressure chronic ulcer of other part of left  lower leg with fat layer exposed6/19/2024 No Yes I87.312 Chronic venous hypertension (idiopathic) with ulcer of left lower extremity 02/16/2023 No Yes I89.0 Lymphedema, not elsewhere classified 03/23/2023 No Yes I87.321 Chronic venous hypertension (idiopathic) with inflammation of right lower 02/16/2023 No Yes extremity E11.622 Type 2 diabetes mellitus with other skin ulcer 02/16/2023 No Yes Inactive Problems ICD-10 Code Description Active Date Inactive Date L97.522 Non-pressure chronic ulcer of other part of left foot with fat layer exposed 03/16/2023 03/16/2023 Resolved Problems Electronic Signature(s) Signed: 06/08/2023 4:50:17 PM By: Baltazar Najjar MD Entered By: Baltazar Najjar on 06/08/2023 05:38:31 -------------------------------------------------------------------------------- Progress Note Details Patient Name: Date of Service: [name], [name] 06/08/2023 8:00 [name] Medical Record Number: [id number] Patient Account Number: [id number] Date of Birth/Sex: Treating RN: 1971-05-22 (52 y.o. [name] Primary Care Provider: Maudie [name] Other Clinician: Referring Provider: Treating Provider/Extender: [name], [name] in Treatment: 16 Subjective History of Present Illness (HPI) 12/28/17; this is [name] now 52 year old man who is [name] type II diabetic. He was hospitalized from 10/01/17 through 10/19/17. He had an MSSA soft tissue and skin infection. 2 open areas on the left leg were identified he has [name] smaller area on the left medial calf superiorly just below the knee and [name] wound just above the left ankle on the posterior medial aspect. I think both of these were surgical IandD sites when he was in the hospital. He was discharged with [name] wound VAC at that point however this is since been taken off. He follows with Dr. Sampson [name] for the [facility] and he is still on chronic Keflex at 500 twice [name] day. At that time he was hospitalized his hemoglobin A1c was 15.1 however if I'm  reading his endocrinologist notes correctly that is improved. He has been following with Dr. [facility]ret[name] at [facility] and he has been applying calcium alginate and Unna boots. He has home health changing the dressing. They have also been attempting to get him external compression pumps although the patient is unaware whether they've been approved by insurance at this point. as mentioned he has [name] smaller clean wound on the right lateral calf just below the knee and he has [name] much larger area just above the left ankle  Pat dry using gauze sponges, not tissue or cotton balls. Prim Dressing: Hydrofera Blue Ready Transfer Foam, 2.5x2.5 (in/in) 3 x Per Week/30 Days ary Discharge Instructions: Apply Hydrofera Blue Ready to wound bed as directed Secondary Dressing: Zetuvit Plus 4x4 (in/in) 3 x Per Week/30 Days Secured With: D[employer] Food Dressing, Latex-free, Size 5, Small-Head / Shoulder / Thigh 3 x Per Week/30 Days Discharge Instructions: over wrap for comfort Compression Wrap: Urgo K2, two layer compression system, large 3 x Per Week/30 Days Electronic Signature(s) Signed: 06/08/2023 4:50:17 PM By: Baltazar Najjar MD Signed: 06/27/2023 8:01:26 AM By: [name] Pax RN Previous Signature: 06/08/2023 11:12:30 AM Version By: [name] Pax RN Entered By: [name] on 06/08/2023 08:14:12 -------------------------------------------------------------------------------- Problem List Details Patient Name: Date of Service: [name], [name] 06/08/2023 8:00 [name] Medical Record Number: [id number] Patient Account Number: [id number] Date of Birth/Sex: Treating RN: 1970-12-30 (52 y.o. [name] Primary Care Provider: Maudie [name] Other Clinician: Referring Provider: Treating Provider/Extender: [name] BSO [name], [name] in Treatment: 16 Active Problems [name], [name] ([id number]) [phone].pdf Page 6 of 10 ICD-10 Encounter Code Description Active Date MDM Diagnosis L97.822 Non-pressure chronic ulcer of other part of left  lower leg with fat layer exposed6/19/2024 No Yes I87.312 Chronic venous hypertension (idiopathic) with ulcer of left lower extremity 02/16/2023 No Yes I89.0 Lymphedema, not elsewhere classified 03/23/2023 No Yes I87.321 Chronic venous hypertension (idiopathic) with inflammation of right lower 02/16/2023 No Yes extremity E11.622 Type 2 diabetes mellitus with other skin ulcer 02/16/2023 No Yes Inactive Problems ICD-10 Code Description Active Date Inactive Date L97.522 Non-pressure chronic ulcer of other part of left foot with fat layer exposed 03/16/2023 03/16/2023 Resolved Problems Electronic Signature(s) Signed: 06/08/2023 4:50:17 PM By: Baltazar Najjar MD Entered By: Baltazar Najjar on 06/08/2023 05:38:31 -------------------------------------------------------------------------------- Progress Note Details Patient Name: Date of Service: [name], [name] 06/08/2023 8:00 [name] Medical Record Number: [id number] Patient Account Number: [id number] Date of Birth/Sex: Treating RN: 1971-05-22 (52 y.o. [name] Primary Care Provider: Maudie [name] Other Clinician: Referring Provider: Treating Provider/Extender: [name], [name] in Treatment: 16 Subjective History of Present Illness (HPI) 12/28/17; this is [name] now 52 year old man who is [name] type II diabetic. He was hospitalized from 10/01/17 through 10/19/17. He had an MSSA soft tissue and skin infection. 2 open areas on the left leg were identified he has [name] smaller area on the left medial calf superiorly just below the knee and [name] wound just above the left ankle on the posterior medial aspect. I think both of these were surgical IandD sites when he was in the hospital. He was discharged with [name] wound VAC at that point however this is since been taken off. He follows with Dr. Sampson [name] for the [facility] and he is still on chronic Keflex at 500 twice [name] day. At that time he was hospitalized his hemoglobin A1c was 15.1 however if I'm  reading his endocrinologist notes correctly that is improved. He has been following with Dr. Lorretta [name] at [facility] and he has been applying calcium alginate and Unna boots. He has home health changing the dressing. They have also been attempting to get him external compression pumps although the patient is unaware whether they've been approved by insurance at this point. as mentioned he has [name] smaller clean wound on the right lateral calf just below the knee and he has [name] much larger area just above the left ankle  [name] ([id number]) [id number] Page 1 of 10 Visit Report for 06/08/2023 HPI Details Patient Name: Date of Service: [name], [name] 06/08/2023 8:00 [name] Medical Record Number: [id number] Patient Account Number: [id number] Date of Birth/Sex: Treating RN: 02/10/1971 (52 y.o. [name]) [name] Primary Care Provider: Maudie [name] Other Clinician: Referring Provider: Treating Provider/Extender: [name], [name] in Treatment: 16 History of Present Illness HPI Description: 12/28/17; this is [name] now 52 year old man who is [name] type II diabetic. He was hospitalized from 10/01/17 through 10/19/17. He had an MSSA soft tissue and skin infection. 2 open areas on the left leg were identified he has [name] smaller area on the left medial calf superiorly just below the knee and [name] wound just above the left ankle on the posterior medial aspect. I think both of these were surgical IandD sites when he was in the hospital. He was discharged with [name] wound VAC at that point however this is since been taken off. He follows with Dr. Sampson [name] for the [facility] and he is still on chronic Keflex at 500 twice [name] day. At that time he was hospitalized his hemoglobin A1c was 15.1 however if I'm reading his endocrinologist notes correctly that is improved. He has been following with Dr. Lorretta [name] at [facility] and he has been applying calcium alginate and Unna boots. He has home health changing the dressing. They have also been attempting to get him external compression pumps although the patient is unaware whether they've been approved by insurance at this point. as mentioned he has [name] smaller clean wound on the right lateral calf just below the knee and he has [name] much larger area just above the left ankle medially and posteriorly. Our intake nurse reported greenish purulent looking drainage.the patient did have surgical material sent to pathology in February. This showed chronic  abscess The patient also has lymphedema stage III in the left greater than right lower extremities. He has [name] history of blisters with wounds but these of all were always healed. The patient thinks that the lymphedema may have been present since he was about 52 years old i.e. about 30 years. He does not have graded pressure stockings and has not worn stockings. He does not have [name] distant history of DVT PE or phlebitis. He has not been systemically unwell fever no chills. He states that his Lasix is recently been reduced. He tells me his kidney function is at "30%" and he has been followed by Dr. Thedore Mins of nephrology. At one point he was on Lasix 80 twice [name] day however that's been cut back and he is now on Lasix at 20 twice [name] day. The patient has [name] history of PAD listed in his records although he comes from Dr. Lorretta [name] I don't think is felt to have significant PAD. ABIs in our clinic were noncompressible bilaterally. 01/04/18; patient has [name] large wound on the left lateral lower calf and [name] small wound on the left medial upper calf. He has been to see his nephrologist who changed him to Demadex 40 mg [name] day. I'm hopeful this will help with his systemic fluid overload. He also has stage III lymphedema. Really no change in the 2 wounds since last week 01/11/18; the patient is down 13 pounds. He put his stage III lymphedema left leg in 4K compression last week and there is less edema fluid however we still haven't been able to communicate with home health but

## 2023-06-27 NOTE — Progress Notes (Signed)
become smaller. 9/18; patient with longstanding chronic lymphedema. Wound on the left medial lower leg. We have been using silver alginate under Urgo K2 lite. The wound is improved in terms of appearance and measurements per our intake nurse 9/25; chronic longstanding lymphedema. Wound on the left medial lower leg. Measurements are better this week. Last week I think I changed him to [facility] still under and Urgo K2 lite 10/2; chronic longstanding lymphedema with [name] weak wound on the left anterior medial lower leg. Not much change in dimension this week slightly hyper granulated. I thought I had changed him to [facility] however according to our intake nurse he has been receiving silver alginate. We are using an Urgo K2 light compression because TBI's on the left were only 0.33 is ABIs were noncompressible. He follows with vein and vascular for his arterial insufficiency Electronic Signature(s) Signed: 06/01/2023 3:47:50 PM By: [name] Najjar [name] Entered By: [name] on 06/01/2023 05:49:54 [name], [name] [id number] Page 5 of 12 -------------------------------------------------------------------------------- Physical Exam Details Patient Name: Date of Service: [name], [name] 06/01/2023 8:15 [name] Medical Record Number: [id number] Patient Account Number: [id number] Date of Birth/Sex: Treating [name]: 11/16/1970 (52 y.o. [name] Primary  Care Provider: Maudie [name] Other Clinician: Betha [name] Referring Provider: Treating Provider/Extender: RO BSO N, [name] in Treatment: 15 Constitutional Sitting or standing Blood Pressure is within target range for patient.. Pulse regular and within target range for patient.[name] Respirations regular, non-labored and within target range.. Temperature is normal and within the target range for the patient.[name] appears in no distress. Notes Wound exam; left anterior medial lower leg circular wound slightly hyper granulated. Also with rolled senescent edges and callus around the circumference. I used silver nitrate on the wound itself and using [name] #5 curette took off the periwound callus. Minimal bleeding. No evidence of infection. The edema control in the left leg is mediocre by my estimation Electronic Signature(s) Signed: 06/01/2023 3:47:50 PM By: [name] Najjar [name] Entered By: [name] on 06/01/2023 05:50:55 -------------------------------------------------------------------------------- Physician Orders Details Patient Name: Date of Service: [name], [name] 06/01/2023 8:15 [name] Medical Record Number: [id number] Patient Account Number: [id number] Date of Birth/Sex: Treating [name]: 04/25/1971 (52 y.o. [name] Primary Care Provider: Maudie [name] Other Clinician: Betha [name] Referring Provider: Treating Provider/Extender: [name], [name] in Treatment: 15 Verbal / Phone Orders: Yes Clinician: Yevonne [name] Read Back and Verified: Yes Diagnosis Coding Follow-up Appointments Return Appointment in 1 week. Home Health Home Health Company: - [facility] for wound care. May utilize formulary equivalent dressing for wound treatment orders unless otherwise specified. Home Health Nurse may visit PRN to address patients wound care needs. [name] [phone] Change on Monday and Friday. Wound Care Center  will change on Wednesday Scheduled days for dressing changes to be completed; exception, patient has scheduled wound care visit that day. **Please direct any NON-WOUND related issues/requests for orders to patient's Primary Care Physician. **If current dressing causes regression in wound condition, may D/C ordered dressing product/s and apply Normal Saline Moist Dressing daily until next Wound Healing Center or Other [name] appointment. **Notify Wound Healing Center of regression in wound condition at [phone]. Bathing/ Shower/ Hygiene May shower with wound dressing protected with water repellent cover or cast protector. No tub bath. [name] ([id number]) [id number] Page 6 of 12 Anesthetic (Use 'Patient Medications' Section for Anesthetic Order Entry)  [name] ([id number]) [id number] Page 1 of 12 Visit Report for 06/01/2023 Debridement Details Patient Name: Date of Service: [name], [name] 06/01/2023 8:15 [name] Medical Record Number: [id number] Patient Account Number: [id number] Date of Birth/Sex: Treating [name]: [birth year] (52 y.o. [name]) [name] Primary Care Provider: Maudie [name] Other Clinician: Betha [name] Referring Provider: Treating Provider/Extender: RO BSO [name], [name] in Treatment: 15 Debridement Performed for Assessment: Wound #22 Left Lower Leg Performed By: Physician [name] Debridement Type: Debridement Severity of Tissue Pre Debridement: Fat layer exposed Level of Consciousness (Pre-procedure): Awake and Alert Pre-procedure Verification/Time Out Yes - 08:37 Taken: Start Time: 08:37 Percent of Wound Bed Debrided: 100% T Area Debrided (cm): otal 5.34 Tissue and other material debrided: Viable, Non-Viable, Callus Level: Non-Viable Tissue Debridement Description: Selective/Open Wound Instrument: Curette, Other : silver nitrate Bleeding: Minimum Hemostasis Achieved: Pressure Response to Treatment: Procedure was tolerated well Level of Consciousness (Post- Awake and Alert procedure): Post Debridement Measurements of Total Wound Length: (cm) 3.4 Width: (cm) 2 Depth: (cm) 0.1 Volume: (cm) 0.534 Character of Wound/Ulcer Post Debridement: Stable Severity of Tissue Post Debridement: Fat layer exposed Post Procedure Diagnosis Same as Pre-procedure Notes 2 sticks of silver nitrate used for hypergranulation tissue Electronic Signature(s) Signed: 06/01/2023 3:47:50 PM By: [name] Najjar [name] Signed: 06/02/2023 9:05:28 AM By: [name] Signed: 06/27/2023 8:01:26 AM By: [name] Entered By: [name] on 06/01/2023 05:38:27 Penton, Daevion [name] ([id number]) [id number] Page 2 of  12 -------------------------------------------------------------------------------- HPI Details Patient Name: Date of Service: [name], [name] 06/01/2023 8:15 [name] Medical Record Number: [id number] Patient Account Number: [id number] Date of Birth/Sex: Treating [name]: 1971-07-04 (52 y.o. [name] Primary Care Provider: Maudie [name] Other Clinician: Betha [name] Referring Provider: Treating Provider/Extender: RO BSO N, [name] in Treatment: 15 History of Present Illness HPI Description: 12/28/17; this is [name] now 52 year old man who is [name] type II diabetic. He was hospitalized from 10/01/17 through 10/19/17. He had an MSSA soft tissue and skin infection. 2 open areas on the left leg were identified he has [name] smaller area on the left medial calf superiorly just below the knee and [name] wound just above the left ankle on the posterior medial aspect. I think both of these were surgical IandD sites when he was in the hospital. He was discharged with [name] wound VAC at that point however this is since been taken off. He follows with Dr. Sampson Goon for the [facility] and he is still on chronic Keflex at 500 twice [name] day. At that time he was hospitalized his hemoglobin A1c was 15.1 however if I'm reading his endocrinologist notes correctly that is improved. He has been following with Dr. Lorretta Harp at vein and vascular and he has been applying calcium alginate and Unna boots. He has home health changing the dressing. They have also been attempting to get him external compression pumps although the patient is unaware whether they've been approved by insurance at this point. as mentioned he has [name] smaller clean wound on the right lateral calf just below the knee and he has [name] much larger area just above the left ankle medially and posteriorly. Our intake nurse reported greenish purulent looking drainage.the patient did have surgical material sent to pathology in February. This showed chronic  abscess The patient also has lymphedema stage III in the left greater than right lower extremities. He has [name] history of blisters with wounds  Wrap: Urgo K2 Lite, two layer compression system, large 3 x Per Week/30 Days 1. We are using Urgo K2 lite's I think because of the reduced TBI is 0.33 2. He follows with vein and vascular and I will try to have [name] look to see what Dr. Gilda Crease thinks about his arterial supply and make [name] decision about whether we increase his compression to the full-strength or not. He might benefit from that. 3. Because of the hypergranulation today I had to use silver nitrate on this I change the dressing to Hydrofera Blue. Noteworthy that I already thought he was getting Kandis Mannan and some of my notes Electronic Signature(s) Signed: 06/01/2023  3:47:50 PM By: [name] Najjar [name] Entered By: [name] on 06/01/2023 05:52:54 -------------------------------------------------------------------------------- SuperBill Details Patient Name: Date of Service: [name], [name] 06/01/2023 Medical Record Number: [id number] Patient Account Number: [id number] Date of Birth/Sex: Treating [name]: 05/17/1971 (52 y.o. [name] Primary Care Provider: Maudie [name] Other Clinician: Betha [name] Referring Provider: Treating Provider/Extender: [name], [name] in Treatment: 15 Diagnosis Coding ICD-10 Codes Code Description [phone] Non-pressure chronic ulcer of other part of left lower leg with fat layer exposed I87.312 Chronic venous hypertension (idiopathic) with ulcer of left lower extremity I89.0 Lymphedema, not elsewhere classified Dutton, Obdulio [name] ([id number]) [id number] Page 12 of 12 I87.321 Chronic venous hypertension (idiopathic) with inflammation of right lower extremity E11.622 Type 2 diabetes mellitus with other skin ulcer Facility Procedures : CPT4 Code: [id number] Description: [phone] - DEBRIDE WOUND 1ST 20 SQ CM OR < ICD-10 Diagnosis Description L97.822 Non-pressure chronic ulcer of other part of left lower leg with fat layer expose I89.0 Lymphedema, not elsewhere classified Modifier: d Quantity: 1 Physician Procedures : CPT4 Code Description Modifier [phone] [id number] - WC PHYS DEBR WO ANESTH 20 SQ CM ICD-10 Diagnosis Description L97.822 Non-pressure chronic ulcer of other part of left lower leg with fat layer exposed I89.0 Lymphedema, not elsewhere classified Quantity: 1 Electronic Signature(s) Signed: 06/01/2023 3:47:50 PM By: [name] Najjar [name] Entered By: [name] on 06/01/2023 05:53:10  [name] ([id number]) [id number] Page 1 of 12 Visit Report for 06/01/2023 Debridement Details Patient Name: Date of Service: [name], [name] 06/01/2023 8:15 [name] Medical Record Number: [id number] Patient Account Number: [id number] Date of Birth/Sex: Treating [name]: [birth year] (52 y.o. [name]) [name] Primary Care Provider: Maudie [name] Other Clinician: Betha [name] Referring Provider: Treating Provider/Extender: RO BSO [name], [name] in Treatment: 15 Debridement Performed for Assessment: Wound #22 Left Lower Leg Performed By: Physician [name] Debridement Type: Debridement Severity of Tissue Pre Debridement: Fat layer exposed Level of Consciousness (Pre-procedure): Awake and Alert Pre-procedure Verification/Time Out Yes - 08:37 Taken: Start Time: 08:37 Percent of Wound Bed Debrided: 100% T Area Debrided (cm): otal 5.34 Tissue and other material debrided: Viable, Non-Viable, Callus Level: Non-Viable Tissue Debridement Description: Selective/Open Wound Instrument: Curette, Other : silver nitrate Bleeding: Minimum Hemostasis Achieved: Pressure Response to Treatment: Procedure was tolerated well Level of Consciousness (Post- Awake and Alert procedure): Post Debridement Measurements of Total Wound Length: (cm) 3.4 Width: (cm) 2 Depth: (cm) 0.1 Volume: (cm) 0.534 Character of Wound/Ulcer Post Debridement: Stable Severity of Tissue Post Debridement: Fat layer exposed Post Procedure Diagnosis Same as Pre-procedure Notes 2 sticks of silver nitrate used for hypergranulation tissue Electronic Signature(s) Signed: 06/01/2023 3:47:50 PM By: [name] Najjar [name] Signed: 06/02/2023 9:05:28 AM By: [name] Signed: 06/27/2023 8:01:26 AM By: [name] Entered By: [name] on 06/01/2023 05:38:27 Penton, Daevion [name] ([id number]) [id number] Page 2 of  12 -------------------------------------------------------------------------------- HPI Details Patient Name: Date of Service: [name], [name] 06/01/2023 8:15 [name] Medical Record Number: [id number] Patient Account Number: [id number] Date of Birth/Sex: Treating [name]: 1971-07-04 (52 y.o. [name] Primary Care Provider: Maudie [name] Other Clinician: Betha [name] Referring Provider: Treating Provider/Extender: RO BSO N, [name] in Treatment: 15 History of Present Illness HPI Description: 12/28/17; this is [name] now 52 year old man who is [name] type II diabetic. He was hospitalized from 10/01/17 through 10/19/17. He had an MSSA soft tissue and skin infection. 2 open areas on the left leg were identified he has [name] smaller area on the left medial calf superiorly just below the knee and [name] wound just above the left ankle on the posterior medial aspect. I think both of these were surgical IandD sites when he was in the hospital. He was discharged with [name] wound VAC at that point however this is since been taken off. He follows with Dr. Sampson Goon for the [facility] and he is still on chronic Keflex at 500 twice [name] day. At that time he was hospitalized his hemoglobin A1c was 15.1 however if I'm reading his endocrinologist notes correctly that is improved. He has been following with Dr. Lorretta Harp at vein and vascular and he has been applying calcium alginate and Unna boots. He has home health changing the dressing. They have also been attempting to get him external compression pumps although the patient is unaware whether they've been approved by insurance at this point. as mentioned he has [name] smaller clean wound on the right lateral calf just below the knee and he has [name] much larger area just above the left ankle medially and posteriorly. Our intake nurse reported greenish purulent looking drainage.the patient did have surgical material sent to pathology in February. This showed chronic  abscess The patient also has lymphedema stage III in the left greater than right lower extremities. He has [name] history of blisters with wounds  become smaller. 9/18; patient with longstanding chronic lymphedema. Wound on the left medial lower leg. We have been using silver alginate under Urgo K2 lite. The wound is improved in terms of appearance and measurements per our intake nurse 9/25; chronic longstanding lymphedema. Wound on the left medial lower leg. Measurements are better this week. Last week I think I changed him to [facility] still under and Urgo K2 lite 10/2; chronic longstanding lymphedema with [name] weak wound on the left anterior medial lower leg. Not much change in dimension this week slightly hyper granulated. I thought I had changed him to [facility] however according to our intake nurse he has been receiving silver alginate. We are using an Urgo K2 light compression because TBI's on the left were only 0.33 is ABIs were noncompressible. He follows with vein and vascular for his arterial insufficiency Electronic Signature(s) Signed: 06/01/2023 3:47:50 PM By: [name] Najjar [name] Entered By: [name] on 06/01/2023 05:49:54 [name], [name] [id number] Page 5 of 12 -------------------------------------------------------------------------------- Physical Exam Details Patient Name: Date of Service: [name], [name] 06/01/2023 8:15 [name] Medical Record Number: [id number] Patient Account Number: [id number] Date of Birth/Sex: Treating [name]: 11/16/1970 (52 y.o. [name] Primary  Care Provider: Maudie [name] Other Clinician: Betha [name] Referring Provider: Treating Provider/Extender: RO BSO N, [name] in Treatment: 15 Constitutional Sitting or standing Blood Pressure is within target range for patient.. Pulse regular and within target range for patient.[name] Respirations regular, non-labored and within target range.. Temperature is normal and within the target range for the patient.[name] appears in no distress. Notes Wound exam; left anterior medial lower leg circular wound slightly hyper granulated. Also with rolled senescent edges and callus around the circumference. I used silver nitrate on the wound itself and using [name] #5 curette took off the periwound callus. Minimal bleeding. No evidence of infection. The edema control in the left leg is mediocre by my estimation Electronic Signature(s) Signed: 06/01/2023 3:47:50 PM By: [name] Najjar [name] Entered By: [name] on 06/01/2023 05:50:55 -------------------------------------------------------------------------------- Physician Orders Details Patient Name: Date of Service: [name], [name] 06/01/2023 8:15 [name] Medical Record Number: [id number] Patient Account Number: [id number] Date of Birth/Sex: Treating [name]: 04/25/1971 (52 y.o. [name] Primary Care Provider: Maudie [name] Other Clinician: Betha [name] Referring Provider: Treating Provider/Extender: [name], [name] in Treatment: 15 Verbal / Phone Orders: Yes Clinician: Yevonne [name] Read Back and Verified: Yes Diagnosis Coding Follow-up Appointments Return Appointment in 1 week. Home Health Home Health Company: - [facility] for wound care. May utilize formulary equivalent dressing for wound treatment orders unless otherwise specified. Home Health Nurse may visit PRN to address patients wound care needs. [name] [phone] Change on Monday and Friday. Wound Care Center  will change on Wednesday Scheduled days for dressing changes to be completed; exception, patient has scheduled wound care visit that day. **Please direct any NON-WOUND related issues/requests for orders to patient's Primary Care Physician. **If current dressing causes regression in wound condition, may D/C ordered dressing product/s and apply Normal Saline Moist Dressing daily until next Wound Healing Center or Other [name] appointment. **Notify Wound Healing Center of regression in wound condition at [phone]. Bathing/ Shower/ Hygiene May shower with wound dressing protected with water repellent cover or cast protector. No tub bath. [name] ([id number]) [id number] Page 6 of 12 Anesthetic (Use 'Patient Medications' Section for Anesthetic Order Entry)  become smaller. 9/18; patient with longstanding chronic lymphedema. Wound on the left medial lower leg. We have been using silver alginate under Urgo K2 lite. The wound is improved in terms of appearance and measurements per our intake nurse 9/25; chronic longstanding lymphedema. Wound on the left medial lower leg. Measurements are better this week. Last week I think I changed him to [facility] still under and Urgo K2 lite 10/2; chronic longstanding lymphedema with [name] weak wound on the left anterior medial lower leg. Not much change in dimension this week slightly hyper granulated. I thought I had changed him to [facility] however according to our intake nurse he has been receiving silver alginate. We are using an Urgo K2 light compression because TBI's on the left were only 0.33 is ABIs were noncompressible. He follows with vein and vascular for his arterial insufficiency Electronic Signature(s) Signed: 06/01/2023 3:47:50 PM By: [name] Najjar [name] Entered By: [name] on 06/01/2023 05:49:54 [name], [name] [id number] Page 5 of 12 -------------------------------------------------------------------------------- Physical Exam Details Patient Name: Date of Service: [name], [name] 06/01/2023 8:15 [name] Medical Record Number: [id number] Patient Account Number: [id number] Date of Birth/Sex: Treating [name]: 11/16/1970 (52 y.o. [name] Primary  Care Provider: Maudie [name] Other Clinician: Betha [name] Referring Provider: Treating Provider/Extender: RO BSO N, [name] in Treatment: 15 Constitutional Sitting or standing Blood Pressure is within target range for patient.. Pulse regular and within target range for patient.[name] Respirations regular, non-labored and within target range.. Temperature is normal and within the target range for the patient.[name] appears in no distress. Notes Wound exam; left anterior medial lower leg circular wound slightly hyper granulated. Also with rolled senescent edges and callus around the circumference. I used silver nitrate on the wound itself and using [name] #5 curette took off the periwound callus. Minimal bleeding. No evidence of infection. The edema control in the left leg is mediocre by my estimation Electronic Signature(s) Signed: 06/01/2023 3:47:50 PM By: [name] Najjar [name] Entered By: [name] on 06/01/2023 05:50:55 -------------------------------------------------------------------------------- Physician Orders Details Patient Name: Date of Service: [name], [name] 06/01/2023 8:15 [name] Medical Record Number: [id number] Patient Account Number: [id number] Date of Birth/Sex: Treating [name]: 04/25/1971 (52 y.o. [name] Primary Care Provider: Maudie [name] Other Clinician: Betha [name] Referring Provider: Treating Provider/Extender: [name], [name] in Treatment: 15 Verbal / Phone Orders: Yes Clinician: Yevonne [name] Read Back and Verified: Yes Diagnosis Coding Follow-up Appointments Return Appointment in 1 week. Home Health Home Health Company: - [facility] for wound care. May utilize formulary equivalent dressing for wound treatment orders unless otherwise specified. Home Health Nurse may visit PRN to address patients wound care needs. [name] [phone] Change on Monday and Friday. Wound Care Center  will change on Wednesday Scheduled days for dressing changes to be completed; exception, patient has scheduled wound care visit that day. **Please direct any NON-WOUND related issues/requests for orders to patient's Primary Care Physician. **If current dressing causes regression in wound condition, may D/C ordered dressing product/s and apply Normal Saline Moist Dressing daily until next Wound Healing Center or Other [name] appointment. **Notify Wound Healing Center of regression in wound condition at [phone]. Bathing/ Shower/ Hygiene May shower with wound dressing protected with water repellent cover or cast protector. No tub bath. [name] ([id number]) [id number] Page 6 of 12 Anesthetic (Use 'Patient Medications' Section for Anesthetic Order Entry)  Lidocaine applied to wound bed Edema Control - Lymphedema / Segmental Compressive Device / Other Tubigrip double layer applied - right leg Elevate, Exercise Daily and [name] void Standing for Long Periods of Time. Elevate legs to the level of the heart and pump ankles as often as possible Elevate leg(s) parallel to the floor when sitting. Wound Treatment Wound #22 - Lower Leg Wound Laterality: Left Cleanser: Soap and Water 3 x Per Week/30 Days Discharge Instructions: Gently cleanse wound with antibacterial soap, rinse and pat dry prior to dressing wounds Cleanser: Wound Cleanser 3 x Per Week/30 Days Discharge Instructions: Wash your hands with soap and water. Remove old dressing, discard into plastic bag and place into trash. Cleanse the wound with Wound Cleanser prior to applying [name] clean dressing using gauze sponges, not tissues or cotton balls. Do not scrub or use excessive force. Pat dry using gauze sponges, not tissue or cotton balls. Prim Dressing: Hydrofera Blue Ready Transfer Foam, 2.5x2.5 (in/in) 3 x Per Week/30 Days ary Discharge Instructions: Apply Hydrofera Blue Ready to wound bed as directed Secondary  Dressing: Zetuvit Plus 4x4 (in/in) 3 x Per Week/30 Days Secured With: [employer] Food Dressing, Latex-free, Size 5, Small-Head / Shoulder / Thigh 3 x Per Week/30 Days Discharge Instructions: over wrap for comfort Compression Wrap: Urgo K2 Lite, two layer compression system, large 3 x Per Week/30 Days Electronic Signature(s) Signed: 06/01/2023 3:47:50 PM By: [name] Najjar [name] Signed: 06/02/2023 9:05:28 AM By: [name] Entered By: [name] on 06/01/2023 05:39:29 -------------------------------------------------------------------------------- Problem List Details Patient Name: Date of Service: [name], [name] 06/01/2023 8:15 [name] Medical Record Number: [id number] Patient Account Number: [id number] Date of Birth/Sex: Treating [name]: [birth year] (52 y.o. 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/19/2024 No Yes I87.312 Chronic venous hypertension (idiopathic) with ulcer of left lower extremity 02/16/2023 No Yes I89.0 Lymphedema, not elsewhere classified 03/23/2023 No Yes Delisle, Pedrohenrique [name] ([id number]) [id number] Page 7 of 12 I87.321 Chronic venous hypertension (idiopathic) with inflammation of right lower 02/16/2023 No Yes extremity E11.622 Type 2 diabetes mellitus with other skin ulcer 02/16/2023 No Yes Inactive Problems ICD-10 Code Description Active Date Inactive Date L97.522 Non-pressure chronic ulcer of other part of left foot with fat layer exposed 03/16/2023 03/16/2023 Resolved Problems Electronic Signature(s) Signed: 06/01/2023 3:47:50 PM By: [name] Najjar [name] Entered By: [name] on 06/01/2023  05:48:31 -------------------------------------------------------------------------------- Progress Note Details Patient Name: Date of Service: [name], [name] 06/01/2023 8:15 [name] Medical Record Number: [id number] Patient Account Number: [id number] Date of Birth/Sex: Treating [name]: [birth year] (52 y.o. [name] Primary Care Provider: Maudie [name] Other Clinician: Betha [name] Referring Provider: Treating Provider/Extender: RO BSO N, [name] in Treatment: 15 Subjective History of Present Illness (HPI) 12/28/17; this is [name] now 52 year old man who is [name] type II diabetic. He was hospitalized from 10/01/17 through 10/19/17. He had an MSSA soft tissue and skin infection. 2 open areas on the left leg were identified he has [name] smaller area on the left medial calf superiorly just below the knee and [name] wound just above the left ankle on the posterior medial aspect. I think both of these were surgical IandD sites when he was in the hospital.  Lidocaine applied to wound bed Edema Control - Lymphedema / Segmental Compressive Device / Other Tubigrip double layer applied - right leg Elevate, Exercise Daily and [name] void Standing for Long Periods of Time. Elevate legs to the level of the heart and pump ankles as often as possible Elevate leg(s) parallel to the floor when sitting. Wound Treatment Wound #22 - Lower Leg Wound Laterality: Left Cleanser: Soap and Water 3 x Per Week/30 Days Discharge Instructions: Gently cleanse wound with antibacterial soap, rinse and pat dry prior to dressing wounds Cleanser: Wound Cleanser 3 x Per Week/30 Days Discharge Instructions: Wash your hands with soap and water. Remove old dressing, discard into plastic bag and place into trash. Cleanse the wound with Wound Cleanser prior to applying [name] clean dressing using gauze sponges, not tissues or cotton balls. Do not scrub or use excessive force. Pat dry using gauze sponges, not tissue or cotton balls. Prim Dressing: Hydrofera Blue Ready Transfer Foam, 2.5x2.5 (in/in) 3 x Per Week/30 Days ary Discharge Instructions: Apply Hydrofera Blue Ready to wound bed as directed Secondary  Dressing: Zetuvit Plus 4x4 (in/in) 3 x Per Week/30 Days Secured With: [employer] Dressing, Latex-free, Size 5, Small-Head / Shoulder / Thigh 3 x Per Week/30 Days Discharge Instructions: over wrap for comfort Compression Wrap: Urgo K2 Lite, two layer compression system, large 3 x Per Week/30 Days Electronic Signature(s) Signed: 06/01/2023 3:47:50 PM By: [name] Najjar [name] Signed: 06/02/2023 9:05:28 AM By: [name] Entered By: [name] on 06/01/2023 05:39:29 -------------------------------------------------------------------------------- Problem List Details Patient Name: Date of Service: [name], [name] 06/01/2023 8:15 [name] Medical Record Number: [id number] Patient Account Number: [id number] Date of Birth/Sex: Treating [name]: [birth year] (52 y.o. 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/19/2024 No Yes I87.312 Chronic venous hypertension (idiopathic) with ulcer of left lower extremity 02/16/2023 No Yes I89.0 Lymphedema, not elsewhere classified 03/23/2023 No Yes Delisle, Pedrohenrique [name] ([id number]) [id number] Page 7 of 12 I87.321 Chronic venous hypertension (idiopathic) with inflammation of right lower 02/16/2023 No Yes extremity E11.622 Type 2 diabetes mellitus with other skin ulcer 02/16/2023 No Yes Inactive Problems ICD-10 Code Description Active Date Inactive Date L97.522 Non-pressure chronic ulcer of other part of left foot with fat layer exposed 03/16/2023 03/16/2023 Resolved Problems Electronic Signature(s) Signed: 06/01/2023 3:47:50 PM By: [name] Najjar [name] Entered By: [name] on 06/01/2023  05:48:31 -------------------------------------------------------------------------------- Progress Note Details Patient Name: Date of Service: [name], [name] 06/01/2023 8:15 [name] Medical Record Number: [id number] Patient Account Number: [id number] Date of Birth/Sex: Treating [name]: [birth year] (52 y.o. [name] Primary Care Provider: Maudie [name] Other Clinician: Betha [name] Referring Provider: Treating Provider/Extender: RO BSO N, [name] in Treatment: 15 Subjective History of Present Illness (HPI) 12/28/17; this is [name] now 52 year old man who is [name] type II diabetic. He was hospitalized from 10/01/17 through 10/19/17. He had an MSSA soft tissue and skin infection. 2 open areas on the left leg were identified he has [name] smaller area on the left medial calf superiorly just below the knee and [name] wound just above the left ankle on the posterior medial aspect. I think both of these were surgical IandD sites when he was in the hospital.  Wrap: Urgo K2 Lite, two layer compression system, large 3 x Per Week/30 Days 1. We are using Urgo K2 lite's I think because of the reduced TBI is 0.33 2. He follows with vein and vascular and I will try to have [name] look to see what Dr. Gilda Crease thinks about his arterial supply and make [name] decision about whether we increase his compression to the full-strength or not. He might benefit from that. 3. Because of the hypergranulation today I had to use silver nitrate on this I change the dressing to Hydrofera Blue. Noteworthy that I already thought he was getting Kandis Mannan and some of my notes Electronic Signature(s) Signed: 06/01/2023  3:47:50 PM By: [name] Najjar [name] Entered By: [name] on 06/01/2023 05:52:54 -------------------------------------------------------------------------------- SuperBill Details Patient Name: Date of Service: [name], [name] 06/01/2023 Medical Record Number: [id number] Patient Account Number: [id number] Date of Birth/Sex: Treating [name]: 05/17/1971 (52 y.o. [name] Primary Care Provider: Maudie [name] Other Clinician: Betha [name] Referring Provider: Treating Provider/Extender: [name], [name] in Treatment: 15 Diagnosis Coding ICD-10 Codes Code Description [phone] Non-pressure chronic ulcer of other part of left lower leg with fat layer exposed I87.312 Chronic venous hypertension (idiopathic) with ulcer of left lower extremity I89.0 Lymphedema, not elsewhere classified Dutton, Obdulio [name] ([id number]) [id number] Page 12 of 12 I87.321 Chronic venous hypertension (idiopathic) with inflammation of right lower extremity E11.622 Type 2 diabetes mellitus with other skin ulcer Facility Procedures : CPT4 Code: [id number] Description: [phone] - DEBRIDE WOUND 1ST 20 SQ CM OR < ICD-10 Diagnosis Description L97.822 Non-pressure chronic ulcer of other part of left lower leg with fat layer expose I89.0 Lymphedema, not elsewhere classified Modifier: d Quantity: 1 Physician Procedures : CPT4 Code Description Modifier [id number] [id number] - WC PHYS DEBR WO ANESTH 20 SQ CM ICD-10 Diagnosis Description L97.822 Non-pressure chronic ulcer of other part of left lower leg with fat layer exposed I89.0 Lymphedema, not elsewhere classified Quantity: 1 Electronic Signature(s) Signed: 06/01/2023 3:47:50 PM By: [name] Najjar [name] Entered By: [name] on 06/01/2023 05:53:10  become smaller. 9/18; patient with longstanding chronic lymphedema. Wound on the left medial lower leg. We have been using silver alginate under Urgo K2 lite. The wound is improved in terms of appearance and measurements per our intake nurse 9/25; chronic longstanding lymphedema. Wound on the left medial lower leg. Measurements are better this week. Last week I think I changed him to [facility] still under and Urgo K2 lite 10/2; chronic longstanding lymphedema with [name] weak wound on the left anterior medial lower leg. Not much change in dimension this week slightly hyper granulated. I thought I had changed him to [facility] however according to our intake nurse he has been receiving silver alginate. We are using an Urgo K2 light compression because TBI's on the left were only 0.33 is ABIs were noncompressible. He follows with vein and vascular for his arterial insufficiency Electronic Signature(s) Signed: 06/01/2023 3:47:50 PM By: [name] Najjar [name] Entered By: [name] on 06/01/2023 05:49:54 [name], [name] [id number] Page 5 of 12 -------------------------------------------------------------------------------- Physical Exam Details Patient Name: Date of Service: [name], [name] 06/01/2023 8:15 [name] Medical Record Number: [id number] Patient Account Number: [id number] Date of Birth/Sex: Treating [name]: 11/16/1970 (52 y.o. [name] Primary  Care Provider: Maudie [name] Other Clinician: Betha [name] Referring Provider: Treating Provider/Extender: RO BSO N, [name] in Treatment: 15 Constitutional Sitting or standing Blood Pressure is within target range for patient.. Pulse regular and within target range for patient.[name] Respirations regular, non-labored and within target range.. Temperature is normal and within the target range for the patient.[name] appears in no distress. Notes Wound exam; left anterior medial lower leg circular wound slightly hyper granulated. Also with rolled senescent edges and callus around the circumference. I used silver nitrate on the wound itself and using [name] #5 curette took off the periwound callus. Minimal bleeding. No evidence of infection. The edema control in the left leg is mediocre by my estimation Electronic Signature(s) Signed: 06/01/2023 3:47:50 PM By: [name] Najjar [name] Entered By: [name] on 06/01/2023 05:50:55 -------------------------------------------------------------------------------- Physician Orders Details Patient Name: Date of Service: [name], [name] 06/01/2023 8:15 [name] Medical Record Number: [id number] Patient Account Number: [id number] Date of Birth/Sex: Treating [name]: 04/25/1971 (52 y.o. [name] Primary Care Provider: Maudie [name] Other Clinician: Betha [name] Referring Provider: Treating Provider/Extender: [name], [name] in Treatment: 15 Verbal / Phone Orders: Yes Clinician: Yevonne [name] Read Back and Verified: Yes Diagnosis Coding Follow-up Appointments Return Appointment in 1 week. Home Health Home Health Company: - [facility] for wound care. May utilize formulary equivalent dressing for wound treatment orders unless otherwise specified. Home Health Nurse may visit PRN to address patients wound care needs. [name] [phone] Change on Monday and Friday. Wound Care Center  will change on Wednesday Scheduled days for dressing changes to be completed; exception, patient has scheduled wound care visit that day. **Please direct any NON-WOUND related issues/requests for orders to patient's Primary Care Physician. **If current dressing causes regression in wound condition, may D/C ordered dressing product/s and apply Normal Saline Moist Dressing daily until next Wound Healing Center or Other [name] appointment. **Notify Wound Healing Center of regression in wound condition at [phone]. Bathing/ Shower/ Hygiene May shower with wound dressing protected with water repellent cover or cast protector. No tub bath. [name] ([id number]) [id number] Page 6 of 12 Anesthetic (Use 'Patient Medications' Section for Anesthetic Order Entry)  Lidocaine applied to wound bed Edema Control - Lymphedema / Segmental Compressive Device / Other Tubigrip double layer applied - right leg Elevate, Exercise Daily and [name] void Standing for Long Periods of Time. Elevate legs to the level of the heart and pump ankles as often as possible Elevate leg(s) parallel to the floor when sitting. Wound Treatment Wound #22 - Lower Leg Wound Laterality: Left Cleanser: Soap and Water 3 x Per Week/30 Days Discharge Instructions: Gently cleanse wound with antibacterial soap, rinse and pat dry prior to dressing wounds Cleanser: Wound Cleanser 3 x Per Week/30 Days Discharge Instructions: Wash your hands with soap and water. Remove old dressing, discard into plastic bag and place into trash. Cleanse the wound with Wound Cleanser prior to applying [name] clean dressing using gauze sponges, not tissues or cotton balls. Do not scrub or use excessive force. Pat dry using gauze sponges, not tissue or cotton balls. Prim Dressing: Hydrofera Blue Ready Transfer Foam, 2.5x2.5 (in/in) 3 x Per Week/30 Days ary Discharge Instructions: Apply Hydrofera Blue Ready to wound bed as directed Secondary  Dressing: Zetuvit Plus 4x4 (in/in) 3 x Per Week/30 Days Secured With: [employer] Food Dressing, Latex-free, Size 5, Small-Head / Shoulder / Thigh 3 x Per Week/30 Days Discharge Instructions: over wrap for comfort Compression Wrap: Urgo K2 Lite, two layer compression system, large 3 x Per Week/30 Days Electronic Signature(s) Signed: 06/01/2023 3:47:50 PM By: [name] Najjar [name] Signed: 06/02/2023 9:05:28 AM By: [name] Entered By: [name] on 06/01/2023 05:39:29 -------------------------------------------------------------------------------- Problem List Details Patient Name: Date of Service: [name], [name] 06/01/2023 8:15 [name] Medical Record Number: [id number] Patient Account Number: [id number] Date of Birth/Sex: Treating [name]: [birth year] (52 y.o. 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/19/2024 No Yes I87.312 Chronic venous hypertension (idiopathic) with ulcer of left lower extremity 02/16/2023 No Yes I89.0 Lymphedema, not elsewhere classified 03/23/2023 No Yes Delisle, Pedrohenrique [name] ([id number]) [id number] Page 7 of 12 I87.321 Chronic venous hypertension (idiopathic) with inflammation of right lower 02/16/2023 No Yes extremity E11.622 Type 2 diabetes mellitus with other skin ulcer 02/16/2023 No Yes Inactive Problems ICD-10 Code Description Active Date Inactive Date L97.522 Non-pressure chronic ulcer of other part of left foot with fat layer exposed 03/16/2023 03/16/2023 Resolved Problems Electronic Signature(s) Signed: 06/01/2023 3:47:50 PM By: [name] Najjar [name] Entered By: [name] on 06/01/2023  05:48:31 -------------------------------------------------------------------------------- Progress Note Details Patient Name: Date of Service: [name], [name] 06/01/2023 8:15 [name] Medical Record Number: [id number] Patient Account Number: [id number] Date of Birth/Sex: Treating [name]: [birth year] (52 y.o. [name] Primary Care Provider: Maudie [name] Other Clinician: Betha [name] Referring Provider: Treating Provider/Extender: RO BSO N, [name] in Treatment: 15 Subjective History of Present Illness (HPI) 12/28/17; this is [name] now 52 year old man who is [name] type II diabetic. He was hospitalized from 10/01/17 through 10/19/17. He had an MSSA soft tissue and skin infection. 2 open areas on the left leg were identified he has [name] smaller area on the left medial calf superiorly just below the knee and [name] wound just above the left ankle on the posterior medial aspect. I think both of these were surgical IandD sites when he was in the hospital.  become smaller. 9/18; patient with longstanding chronic lymphedema. Wound on the left medial lower leg. We have been using silver alginate under Urgo K2 lite. The wound is improved in terms of appearance and measurements per our intake nurse 9/25; chronic longstanding lymphedema. Wound on the left medial lower leg. Measurements are better this week. Last week I think I changed him to [facility] still under and Urgo K2 lite 10/2; chronic longstanding lymphedema with [name] weak wound on the left anterior medial lower leg. Not much change in dimension this week slightly hyper granulated. I thought I had changed him to [facility] however according to our intake nurse he has been receiving silver alginate. We are using an Urgo K2 light compression because TBI's on the left were only 0.33 is ABIs were noncompressible. He follows with vein and vascular for his arterial insufficiency Electronic Signature(s) Signed: 06/01/2023 3:47:50 PM By: [name] Najjar [name] Entered By: [name] on 06/01/2023 05:49:54 [name], [name] [id number] Page 5 of 12 -------------------------------------------------------------------------------- Physical Exam Details Patient Name: Date of Service: [name], [name] 06/01/2023 8:15 [name] Medical Record Number: [id number] Patient Account Number: [id number] Date of Birth/Sex: Treating [name]: 11/16/1970 (52 y.o. [name] Primary  Care Provider: Maudie [name] Other Clinician: Betha [name] Referring Provider: Treating Provider/Extender: RO BSO N, [name] in Treatment: 15 Constitutional Sitting or standing Blood Pressure is within target range for patient.. Pulse regular and within target range for patient.[name] Respirations regular, non-labored and within target range.. Temperature is normal and within the target range for the patient.[name] appears in no distress. Notes Wound exam; left anterior medial lower leg circular wound slightly hyper granulated. Also with rolled senescent edges and callus around the circumference. I used silver nitrate on the wound itself and using [name] #5 curette took off the periwound callus. Minimal bleeding. No evidence of infection. The edema control in the left leg is mediocre by my estimation Electronic Signature(s) Signed: 06/01/2023 3:47:50 PM By: [name] Najjar [name] Entered By: [name] on 06/01/2023 05:50:55 -------------------------------------------------------------------------------- Physician Orders Details Patient Name: Date of Service: [name], [name] 06/01/2023 8:15 [name] Medical Record Number: [id number] Patient Account Number: [id number] Date of Birth/Sex: Treating [name]: 04/25/1971 (52 y.o. [name] Primary Care Provider: Maudie [name] Other Clinician: Betha [name] Referring Provider: Treating Provider/Extender: [name], [name] in Treatment: 15 Verbal / Phone Orders: Yes Clinician: Yevonne [name] Read Back and Verified: Yes Diagnosis Coding Follow-up Appointments Return Appointment in 1 week. Home Health Home Health Company: - [facility] for wound care. May utilize formulary equivalent dressing for wound treatment orders unless otherwise specified. Home Health Nurse may visit PRN to address patients wound care needs. [name] [phone] Change on Monday and Friday. Wound Care Center  will change on Wednesday Scheduled days for dressing changes to be completed; exception, patient has scheduled wound care visit that day. **Please direct any NON-WOUND related issues/requests for orders to patient's Primary Care Physician. **If current dressing causes regression in wound condition, may D/C ordered dressing product/s and apply Normal Saline Moist Dressing daily until next Wound Healing Center or Other [name] appointment. **Notify Wound Healing Center of regression in wound condition at [phone]. Bathing/ Shower/ Hygiene May shower with wound dressing protected with water repellent cover or cast protector. No tub bath. [name] ([id number]) [id number] Page 6 of 12 Anesthetic (Use 'Patient Medications' Section for Anesthetic Order Entry)  become smaller. 9/18; patient with longstanding chronic lymphedema. Wound on the left medial lower leg. We have been using silver alginate under Urgo K2 lite. The wound is improved in terms of appearance and measurements per our intake nurse 9/25; chronic longstanding lymphedema. Wound on the left medial lower leg. Measurements are better this week. Last week I think I changed him to [facility] still under and Urgo K2 lite 10/2; chronic longstanding lymphedema with [name] weak wound on the left anterior medial lower leg. Not much change in dimension this week slightly hyper granulated. I thought I had changed him to [facility] however according to our intake nurse he has been receiving silver alginate. We are using an Urgo K2 light compression because TBI's on the left were only 0.33 is ABIs were noncompressible. He follows with vein and vascular for his arterial insufficiency Electronic Signature(s) Signed: 06/01/2023 3:47:50 PM By: [name] Najjar [name] Entered By: [name] on 06/01/2023 05:49:54 [name], [name] [id number] Page 5 of 12 -------------------------------------------------------------------------------- Physical Exam Details Patient Name: Date of Service: [name], [name] 06/01/2023 8:15 [name] Medical Record Number: [id number] Patient Account Number: [id number] Date of Birth/Sex: Treating [name]: 11/16/1970 (52 y.o. [name] Primary  Care Provider: Maudie [name] Other Clinician: Betha [name] Referring Provider: Treating Provider/Extender: RO BSO N, [name] in Treatment: 15 Constitutional Sitting or standing Blood Pressure is within target range for patient.. Pulse regular and within target range for patient.[name] Respirations regular, non-labored and within target range.. Temperature is normal and within the target range for the patient.[name] appears in no distress. Notes Wound exam; left anterior medial lower leg circular wound slightly hyper granulated. Also with rolled senescent edges and callus around the circumference. I used silver nitrate on the wound itself and using [name] #5 curette took off the periwound callus. Minimal bleeding. No evidence of infection. The edema control in the left leg is mediocre by my estimation Electronic Signature(s) Signed: 06/01/2023 3:47:50 PM By: [name] Najjar [name] Entered By: [name] on 06/01/2023 05:50:55 -------------------------------------------------------------------------------- Physician Orders Details Patient Name: Date of Service: [name], [name] 06/01/2023 8:15 [name] Medical Record Number: [id number] Patient Account Number: [id number] Date of Birth/Sex: Treating [name]: 04/25/1971 (52 y.o. [name] Primary Care Provider: Maudie [name] Other Clinician: Betha [name] Referring Provider: Treating Provider/Extender: [name], [name] in Treatment: 15 Verbal / Phone Orders: Yes Clinician: Yevonne [name] Read Back and Verified: Yes Diagnosis Coding Follow-up Appointments Return Appointment in 1 week. Home Health Home Health Company: - [facility] for wound care. May utilize formulary equivalent dressing for wound treatment orders unless otherwise specified. Home Health Nurse may visit PRN to address patients wound care needs. [name] [phone] Change on Monday and Friday. Wound Care Center  will change on Wednesday Scheduled days for dressing changes to be completed; exception, patient has scheduled wound care visit that day. **Please direct any NON-WOUND related issues/requests for orders to patient's Primary Care Physician. **If current dressing causes regression in wound condition, may D/C ordered dressing product/s and apply Normal Saline Moist Dressing daily until next Wound Healing Center or Other [name] appointment. **Notify Wound Healing Center of regression in wound condition at [phone]. Bathing/ Shower/ Hygiene May shower with wound dressing protected with water repellent cover or cast protector. No tub bath. [name] ([id number]) [id number] Page 6 of 12 Anesthetic (Use 'Patient Medications' Section for Anesthetic Order Entry)  become smaller. 9/18; patient with longstanding chronic lymphedema. Wound on the left medial lower leg. We have been using silver alginate under Urgo K2 lite. The wound is improved in terms of appearance and measurements per our intake nurse 9/25; chronic longstanding lymphedema. Wound on the left medial lower leg. Measurements are better this week. Last week I think I changed him to [facility] still under and Urgo K2 lite 10/2; chronic longstanding lymphedema with [name] weak wound on the left anterior medial lower leg. Not much change in dimension this week slightly hyper granulated. I thought I had changed him to [facility] however according to our intake nurse he has been receiving silver alginate. We are using an Urgo K2 light compression because TBI's on the left were only 0.33 is ABIs were noncompressible. He follows with vein and vascular for his arterial insufficiency Electronic Signature(s) Signed: 06/01/2023 3:47:50 PM By: [name] Najjar [name] Entered By: [name] on 06/01/2023 05:49:54 [name], [name] [id number] Page 5 of 12 -------------------------------------------------------------------------------- Physical Exam Details Patient Name: Date of Service: [name], [name] 06/01/2023 8:15 [name] Medical Record Number: [id number] Patient Account Number: [id number] Date of Birth/Sex: Treating [name]: 11/16/1970 (52 y.o. [name] Primary  Care Provider: Maudie [name] Other Clinician: Betha [name] Referring Provider: Treating Provider/Extender: RO BSO N, [name] in Treatment: 15 Constitutional Sitting or standing Blood Pressure is within target range for patient.. Pulse regular and within target range for patient.[name] Respirations regular, non-labored and within target range.. Temperature is normal and within the target range for the patient.[name] appears in no distress. Notes Wound exam; left anterior medial lower leg circular wound slightly hyper granulated. Also with rolled senescent edges and callus around the circumference. I used silver nitrate on the wound itself and using [name] #5 curette took off the periwound callus. Minimal bleeding. No evidence of infection. The edema control in the left leg is mediocre by my estimation Electronic Signature(s) Signed: 06/01/2023 3:47:50 PM By: [name] Najjar [name] Entered By: [name] on 06/01/2023 05:50:55 -------------------------------------------------------------------------------- Physician Orders Details Patient Name: Date of Service: [name], [name] 06/01/2023 8:15 [name] Medical Record Number: [id number] Patient Account Number: [id number] Date of Birth/Sex: Treating [name]: 04/25/1971 (52 y.o. [name] Primary Care Provider: Maudie [name] Other Clinician: Betha [name] Referring Provider: Treating Provider/Extender: [name], [name] in Treatment: 15 Verbal / Phone Orders: Yes Clinician: Yevonne [name] Read Back and Verified: Yes Diagnosis Coding Follow-up Appointments Return Appointment in 1 week. Home Health Home Health Company: - [facility] for wound care. May utilize formulary equivalent dressing for wound treatment orders unless otherwise specified. Home Health Nurse may visit PRN to address patients wound care needs. [name] [phone] Change on Monday and Friday. Wound Care Center  will change on Wednesday Scheduled days for dressing changes to be completed; exception, patient has scheduled wound care visit that day. **Please direct any NON-WOUND related issues/requests for orders to patient's Primary Care Physician. **If current dressing causes regression in wound condition, may D/C ordered dressing product/s and apply Normal Saline Moist Dressing daily until next Wound Healing Center or Other [name] appointment. **Notify Wound Healing Center of regression in wound condition at [phone]. Bathing/ Shower/ Hygiene May shower with wound dressing protected with water repellent cover or cast protector. No tub bath. [name] ([id number]) [id number] Page 6 of 12 Anesthetic (Use 'Patient Medications' Section for Anesthetic Order Entry)  Wrap: Urgo K2 Lite, two layer compression system, large 3 x Per Week/30 Days 1. We are using Urgo K2 lite's I think because of the reduced TBI is 0.33 2. He follows with vein and vascular and I will try to have [name] look to see what Dr. Gilda Crease thinks about his arterial supply and make [name] decision about whether we increase his compression to the full-strength or not. He might benefit from that. 3. Because of the hypergranulation today I had to use silver nitrate on this I change the dressing to Hydrofera Blue. Noteworthy that I already thought he was getting Kandis Mannan and some of my notes Electronic Signature(s) Signed: 06/01/2023  3:47:50 PM By: [name] Najjar [name] Entered By: [name] on 06/01/2023 05:52:54 -------------------------------------------------------------------------------- SuperBill Details Patient Name: Date of Service: [name], [name] 06/01/2023 Medical Record Number: [id number] Patient Account Number: [id number] Date of Birth/Sex: Treating [name]: 05/17/1971 (52 y.o. [name] Primary Care Provider: Maudie [name] Other Clinician: Betha [name] Referring Provider: Treating Provider/Extender: [name], [name] in Treatment: 15 Diagnosis Coding ICD-10 Codes Code Description [phone] Non-pressure chronic ulcer of other part of left lower leg with fat layer exposed I87.312 Chronic venous hypertension (idiopathic) with ulcer of left lower extremity I89.0 Lymphedema, not elsewhere classified Dutton, Obdulio [name] ([id number]) [id number] Page 12 of 12 I87.321 Chronic venous hypertension (idiopathic) with inflammation of right lower extremity E11.622 Type 2 diabetes mellitus with other skin ulcer Facility Procedures : CPT4 Code: 66440347 Description: [phone] - DEBRIDE WOUND 1ST 20 SQ CM OR < ICD-10 Diagnosis Description L97.822 Non-pressure chronic ulcer of 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 We are using Urgo K2 lite's I think because of the reduced TBI is 0.33 2. He follows with vein and vascular and I will try to have [name] look to see what Dr. Gilda Crease thinks about his arterial supply and make [name] decision about whether we increase his compression to the full-strength or not. He might benefit from that. 3. Because of the hypergranulation today I had to use silver nitrate on this I change the dressing to Hydrofera Blue. Noteworthy that I already thought he was getting Kandis Mannan and some of my notes Electronic Signature(s) Signed: 06/01/2023  3:47:50 PM By: [name] Najjar [name] Entered By: [name] on 06/01/2023 05:52:54 -------------------------------------------------------------------------------- SuperBill Details Patient Name: Date of Service: [name], [name] 06/01/2023 Medical Record Number: [id number] Patient Account Number: [id number] Date of Birth/Sex: Treating [name]: 05/17/1971 (52 y.o. [name] Primary Care Provider: Maudie [name] Other Clinician: Betha [name] Referring Provider: Treating Provider/Extender: [name], [name] in Treatment: 15 Diagnosis Coding ICD-10 Codes Code Description [phone] Non-pressure chronic ulcer of other part of left lower leg with fat layer exposed I87.312 Chronic venous hypertension (idiopathic) with ulcer of left lower extremity I89.0 Lymphedema, not elsewhere classified Dutton, Obdulio [name] ([id number]) [id number] Page 12 of 12 I87.321 Chronic venous hypertension (idiopathic) with inflammation of right lower extremity E11.622 Type 2 diabetes mellitus with other skin ulcer Facility Procedures : CPT4 Code: 66440347 Description: [phone] - DEBRIDE WOUND 1ST 20 SQ CM OR < ICD-10 Diagnosis Description L97.822 Non-pressure chronic ulcer of 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 We are using Urgo K2 lite's I think because of the reduced TBI is 0.33 2. He follows with vein and vascular and I will try to have [name] look to see what Dr. Gilda Crease thinks about his arterial supply and make [name] decision about whether we increase his compression to the full-strength or not. He might benefit from that. 3. Because of the hypergranulation today I had to use silver nitrate on this I change the dressing to Hydrofera Blue. Noteworthy that I already thought he was getting Kandis Mannan and some of my notes Electronic Signature(s) Signed: 06/01/2023  3:47:50 PM By: [name] Najjar [name] Entered By: [name] on 06/01/2023 05:52:54 -------------------------------------------------------------------------------- SuperBill Details Patient Name: Date of Service: [name], [name] 06/01/2023 Medical Record Number: [id number] Patient Account Number: [id number] Date of Birth/Sex: Treating [name]: 05/17/1971 (52 y.o. [name] Primary Care Provider: Maudie [name] Other Clinician: Betha [name] Referring Provider: Treating Provider/Extender: [name], [name] in Treatment: 15 Diagnosis Coding ICD-10 Codes Code Description [phone] Non-pressure chronic ulcer of other part of left lower leg with fat layer exposed I87.312 Chronic venous hypertension (idiopathic) with ulcer of left lower extremity I89.0 Lymphedema, not elsewhere classified Dutton, Obdulio [name] ([id number]) [id number] Page 12 of 12 I87.321 Chronic venous hypertension (idiopathic) with inflammation of right lower extremity E11.622 Type 2 diabetes mellitus with other skin ulcer Facility Procedures : CPT4 Code: 66440347 Description: [phone] - DEBRIDE WOUND 1ST 20 SQ CM OR < ICD-10 Diagnosis Description L97.822 Non-pressure chronic ulcer of other part of left lower leg with fat layer expose I89.0 Lymphedema, not elsewhere classified Modifier: d Quantity: 1 Physician Procedures : CPT4 Code Description Modifier [phone] - WC PHYS DEBR WO ANESTH 20 SQ CM ICD-10 Diagnosis Description L97.822 Non-pressure chronic ulcer of other part of left lower leg with fat layer exposed I89.0 Lymphedema, not elsewhere classified Quantity: 1 Electronic Signature(s) Signed: 06/01/2023 3:47:50 PM By: [name] Najjar [name] Entered By: [name] on 06/01/2023 05:53:10  become smaller. 9/18; patient with longstanding chronic lymphedema. Wound on the left medial lower leg. We have been using silver alginate under Urgo K2 lite. The wound is improved in terms of appearance and measurements per our intake nurse 9/25; chronic longstanding lymphedema. Wound on the left medial lower leg. Measurements are better this week. Last week I think I changed him to [facility] still under and Urgo K2 lite 10/2; chronic longstanding lymphedema with [name] weak wound on the left anterior medial lower leg. Not much change in dimension this week slightly hyper granulated. I thought I had changed him to [facility] however according to our intake nurse he has been receiving silver alginate. We are using an Urgo K2 light compression because TBI's on the left were only 0.33 is ABIs were noncompressible. He follows with vein and vascular for his arterial insufficiency Electronic Signature(s) Signed: 06/01/2023 3:47:50 PM By: [name] Najjar [name] Entered By: [name] on 06/01/2023 05:49:54 [name], [name] [id number] Page 5 of 12 -------------------------------------------------------------------------------- Physical Exam Details Patient Name: Date of Service: [name], [name] 06/01/2023 8:15 [name] Medical Record Number: [id number] Patient Account Number: [id number] Date of Birth/Sex: Treating [name]: 11/16/1970 (52 y.o. [name] Primary  Care Provider: Maudie [name] Other Clinician: Betha [name] Referring Provider: Treating Provider/Extender: RO BSO N, [name] in Treatment: 15 Constitutional Sitting or standing Blood Pressure is within target range for patient.. Pulse regular and within target range for patient.[name] Respirations regular, non-labored and within target range.. Temperature is normal and within the target range for the patient.[name] appears in no distress. Notes Wound exam; left anterior medial lower leg circular wound slightly hyper granulated. Also with rolled senescent edges and callus around the circumference. I used silver nitrate on the wound itself and using [name] #5 curette took off the periwound callus. Minimal bleeding. No evidence of infection. The edema control in the left leg is mediocre by my estimation Electronic Signature(s) Signed: 06/01/2023 3:47:50 PM By: [name] Najjar [name] Entered By: [name] on 06/01/2023 05:50:55 -------------------------------------------------------------------------------- Physician Orders Details Patient Name: Date of Service: [name], [name] 06/01/2023 8:15 [name] Medical Record Number: [id number] Patient Account Number: [id number] Date of Birth/Sex: Treating [name]: 04/25/1971 (52 y.o. [name] Primary Care Provider: Maudie [name] Other Clinician: Betha [name] Referring Provider: Treating Provider/Extender: [name], [name] in Treatment: 15 Verbal / Phone Orders: Yes Clinician: Yevonne [name] Read Back and Verified: Yes Diagnosis Coding Follow-up Appointments Return Appointment in 1 week. Home Health Home Health Company: - [facility] for wound care. May utilize formulary equivalent dressing for wound treatment orders unless otherwise specified. Home Health Nurse may visit PRN to address patients wound care needs. [name] [phone] Change on Monday and Friday. Wound Care Center  will change on Wednesday Scheduled days for dressing changes to be completed; exception, patient has scheduled wound care visit that day. **Please direct any NON-WOUND related issues/requests for orders to patient's Primary Care Physician. **If current dressing causes regression in wound condition, may D/C ordered dressing product/s and apply Normal Saline Moist Dressing daily until next Wound Healing Center or Other [name] appointment. **Notify Wound Healing Center of regression in wound condition at [phone]. Bathing/ Shower/ Hygiene May shower with wound dressing protected with water repellent cover or cast protector. No tub bath. [name] ([id number]) [id number] Page 6 of 12 Anesthetic (Use 'Patient Medications' Section for Anesthetic Order Entry)  [name] ([id number]) [id number] Page 1 of 12 Visit Report for 06/01/2023 Debridement Details Patient Name: Date of Service: [name], [name] 06/01/2023 8:15 [name] Medical Record Number: [id number] Patient Account Number: [id number] Date of Birth/Sex: Treating [name]: [birth year] (52 y.o. [name]) [name] Primary Care Provider: Maudie [name] Other Clinician: Betha [name] Referring Provider: Treating Provider/Extender: RO BSO [name], [name] in Treatment: 15 Debridement Performed for Assessment: Wound #22 Left Lower Leg Performed By: Physician [name] Debridement Type: Debridement Severity of Tissue Pre Debridement: Fat layer exposed Level of Consciousness (Pre-procedure): Awake and Alert Pre-procedure Verification/Time Out Yes - 08:37 Taken: Start Time: 08:37 Percent of Wound Bed Debrided: 100% T Area Debrided (cm): otal 5.34 Tissue and other material debrided: Viable, Non-Viable, Callus Level: Non-Viable Tissue Debridement Description: Selective/Open Wound Instrument: Curette, Other : silver nitrate Bleeding: Minimum Hemostasis Achieved: Pressure Response to Treatment: Procedure was tolerated well Level of Consciousness (Post- Awake and Alert procedure): Post Debridement Measurements of Total Wound Length: (cm) 3.4 Width: (cm) 2 Depth: (cm) 0.1 Volume: (cm) 0.534 Character of Wound/Ulcer Post Debridement: Stable Severity of Tissue Post Debridement: Fat layer exposed Post Procedure Diagnosis Same as Pre-procedure Notes 2 sticks of silver nitrate used for hypergranulation tissue Electronic Signature(s) Signed: 06/01/2023 3:47:50 PM By: [name] Najjar [name] Signed: 06/02/2023 9:05:28 AM By: [name] Signed: 06/27/2023 8:01:26 AM By: [name] Entered By: [name] on 06/01/2023 05:38:27 Penton, Daevion [name] ([id number]) [id number] Page 2 of  12 -------------------------------------------------------------------------------- HPI Details Patient Name: Date of Service: [name], [name] 06/01/2023 8:15 [name] Medical Record Number: [id number] Patient Account Number: [id number] Date of Birth/Sex: Treating [name]: 1971-07-04 (52 y.o. [name] Primary Care Provider: Maudie [name] Other Clinician: Betha [name] Referring Provider: Treating Provider/Extender: RO BSO N, [name] in Treatment: 15 History of Present Illness HPI Description: 12/28/17; this is [name] now 52 year old man who is [name] type II diabetic. He was hospitalized from 10/01/17 through 10/19/17. He had an MSSA soft tissue and skin infection. 2 open areas on the left leg were identified he has [name] smaller area on the left medial calf superiorly just below the knee and [name] wound just above the left ankle on the posterior medial aspect. I think both of these were surgical IandD sites when he was in the hospital. He was discharged with [name] wound VAC at that point however this is since been taken off. He follows with Dr. Sampson Goon for the [facility] and he is still on chronic Keflex at 500 twice [name] day. At that time he was hospitalized his hemoglobin A1c was 15.1 however if I'm reading his endocrinologist notes correctly that is improved. He has been following with Dr. Lorretta Harp at vein and vascular and he has been applying calcium alginate and Unna boots. He has home health changing the dressing. They have also been attempting to get him external compression pumps although the patient is unaware whether they've been approved by insurance at this point. as mentioned he has [name] smaller clean wound on the right lateral calf just below the knee and he has [name] much larger area just above the left ankle medially and posteriorly. Our intake nurse reported greenish purulent looking drainage.the patient did have surgical material sent to pathology in February. This showed chronic  abscess The patient also has lymphedema stage III in the left greater than right lower extremities. He has [name] history of blisters with wounds  Lidocaine applied to wound bed Edema Control - Lymphedema / Segmental Compressive Device / Other Tubigrip double layer applied - right leg Elevate, Exercise Daily and [name] void Standing for Long Periods of Time. Elevate legs to the level of the heart and pump ankles as often as possible Elevate leg(s) parallel to the floor when sitting. Wound Treatment Wound #22 - Lower Leg Wound Laterality: Left Cleanser: Soap and Water 3 x Per Week/30 Days Discharge Instructions: Gently cleanse wound with antibacterial soap, rinse and pat dry prior to dressing wounds Cleanser: Wound Cleanser 3 x Per Week/30 Days Discharge Instructions: Wash your hands with soap and water. Remove old dressing, discard into plastic bag and place into trash. Cleanse the wound with Wound Cleanser prior to applying [name] clean dressing using gauze sponges, not tissues or cotton balls. Do not scrub or use excessive force. Pat dry using gauze sponges, not tissue or cotton balls. Prim Dressing: Hydrofera Blue Ready Transfer Foam, 2.5x2.5 (in/in) 3 x Per Week/30 Days ary Discharge Instructions: Apply Hydrofera Blue Ready to wound bed as directed Secondary  Dressing: Zetuvit Plus 4x4 (in/in) 3 x Per Week/30 Days Secured With: [employer] Food Dressing, Latex-free, Size 5, Small-Head / Shoulder / Thigh 3 x Per Week/30 Days Discharge Instructions: over wrap for comfort Compression Wrap: Urgo K2 Lite, two layer compression system, large 3 x Per Week/30 Days Electronic Signature(s) Signed: 06/01/2023 3:47:50 PM By: [name] Najjar [name] Signed: 06/02/2023 9:05:28 AM By: [name] Entered By: [name] on 06/01/2023 05:39:29 -------------------------------------------------------------------------------- Problem List Details Patient Name: Date of Service: [name], [name] 06/01/2023 8:15 [name] Medical Record Number: [id number] Patient Account Number: [id number] Date of Birth/Sex: Treating [name]: [birth year] (52 y.o. 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/19/2024 No Yes I87.312 Chronic venous hypertension (idiopathic) with ulcer of left lower extremity 02/16/2023 No Yes I89.0 Lymphedema, not elsewhere classified 03/23/2023 No Yes Delisle, Pedrohenrique [name] ([id number]) [id number] Page 7 of 12 I87.321 Chronic venous hypertension (idiopathic) with inflammation of right lower 02/16/2023 No Yes extremity E11.622 Type 2 diabetes mellitus with other skin ulcer 02/16/2023 No Yes Inactive Problems ICD-10 Code Description Active Date Inactive Date L97.522 Non-pressure chronic ulcer of other part of left foot with fat layer exposed 03/16/2023 03/16/2023 Resolved Problems Electronic Signature(s) Signed: 06/01/2023 3:47:50 PM By: [name] Najjar [name] Entered By: [name] on 06/01/2023  05:48:31 -------------------------------------------------------------------------------- Progress Note Details Patient Name: Date of Service: [name], [name] 06/01/2023 8:15 [name] Medical Record Number: [id number] Patient Account Number: [id number] Date of Birth/Sex: Treating [name]: [birth year] (52 y.o. [name] Primary Care Provider: Maudie [name] Other Clinician: Betha [name] Referring Provider: Treating Provider/Extender: RO BSO N, [name] in Treatment: 15 Subjective History of Present Illness (HPI) 12/28/17; this is [name] now 52 year old man who is [name] type II diabetic. He was hospitalized from 10/01/17 through 10/19/17. He had an MSSA soft tissue and skin infection. 2 open areas on the left leg were identified he has [name] smaller area on the left medial calf superiorly just below the knee and [name] wound just above the left ankle on the posterior medial aspect. I think both of these were surgical IandD sites when he was in the hospital.

## 2023-06-28 ENCOUNTER — Ambulatory Visit: Payer: Self-pay | Admitting: *Deleted

## 2023-06-28 ENCOUNTER — Telehealth (INDEPENDENT_AMBULATORY_CARE_PROVIDER_SITE_OTHER): Payer: Self-pay

## 2023-06-28 DIAGNOSIS — Z992 Dependence on renal dialysis: Secondary | ICD-10-CM | POA: Diagnosis not present

## 2023-06-28 DIAGNOSIS — Z79899 Other long term (current) drug therapy: Secondary | ICD-10-CM | POA: Diagnosis not present

## 2023-06-28 DIAGNOSIS — N186 End stage renal disease: Secondary | ICD-10-CM | POA: Diagnosis not present

## 2023-06-28 DIAGNOSIS — E119 Type 2 diabetes mellitus without complications: Secondary | ICD-10-CM | POA: Diagnosis not present

## 2023-06-28 DIAGNOSIS — N2581 Secondary hyperparathyroidism of renal origin: Secondary | ICD-10-CM | POA: Diagnosis not present

## 2023-06-28 NOTE — Patient Outreach (Signed)
Care Coordination   Follow Up Visit Note   06/28/2023 Name: KENTARO TOZER MRN: [id number] DOB: 1971/01/01  Malacki A Teems is a 52 y.o. year old male who sees [name], [name], MD for primary care. I spoke with  [name] by phone today.  What matters to the patients health and wellness today?  Was seen in the ED with a neck abscess, finished antibiotic course, state now healing.  State leg wound is also healing.  Denies any urgent concerns, encouraged to contact this care manager with questions.     Goals Addressed             This Visit's Progress    Effective management of DM   On track    Care Coordination Interventions: Provided education to patient about basic DM disease process Reviewed medications with patient and discussed importance of medication adherence Counseled on importance of regular laboratory monitoring as prescribed Advised patient, providing education and rationale, to check cbg daily and record, calling provider for findings outside established parameters Report he is adherent to HD on Tuesdays, Thursdays, and Saturdays       Effective wound healing of leg wound   On track    Care Coordination Interventions: Evaluation of current treatment plan related to leg wound and patient's adherence to plan as established by provider Advised patient to call [facility] agency to discuss concerns for treatment (regarding availability of supplies) Reviewed scheduled/upcoming provider appointments including HH wound care nursing visit on Mondays and Fridays, wound care office visits every Wednesday Discussed plans with patient for ongoing care management follow up and provided patient with direct contact information for care management team         SDOH assessments and interventions completed:  No     Care Coordination Interventions:  Yes, provided   Interventions Today    Flowsheet Row Most Recent Value  Chronic Disease   Chronic disease during today's visit  Diabetes, Hypertension (HTN), Chronic Kidney Disease/End Stage Renal Disease (ESRD), Other  [leg wounds]  General Interventions   General Interventions Discussed/Reviewed General Interventions Reviewed, Annual Eye Exam, Labs, Vaccines, Doctor Visits  Labs Hgb A1c every 6 months, Kidney Function  Vaccines Flu  Doctor Visits Discussed/Reviewed Doctor Visits Reviewed, PCP, Specialist  [tomorrow at wound clinic and 11/8 has fistulagram scheduled]  PCP/Specialist Visits Compliance with follow-up visit  [was seen by PCP, urology, and vascular yesterday]  Exercise Interventions   Exercise Discussed/Reviewed Weight Managment  Weight Management Weight loss  Education Interventions   Education Provided Provided Education  Provided Verbal Education On Blood Sugar Monitoring, Nutrition, Foot Care, When to see the doctor, Medication, Other  [Discussed importance of keeping blood sugar managed through diet and medications in order to promote wound healing]  Nutrition Interventions   Nutrition Discussed/Reviewed Nutrition Reviewed, Adding fruits and vegetables, Decreasing sugar intake, Decreasing fats, Portion sizes, Carbohydrate meal planning       Follow up plan: Follow up call scheduled for 11/25    Encounter Outcome:  Patient Visit Completed   Kemper Durie RN, MSN, CCM [place]  [facility], [facility] RN Care Coordinator Direct Dial: [phone] / Main [phone] Fax [phone] Email: [email or web address]: [email or web address]

## 2023-06-28 NOTE — Patient Outreach (Signed)
Care Coordination   06/28/2023 Name: [name] MRN: [id number] DOB: 08-Apr-1971   Care Coordination Outreach Attempts:  An unsuccessful telephone outreach was attempted for a scheduled appointment today.  Follow Up Plan:  Additional outreach attempts will be made to offer the patient care coordination information and services.   Encounter Outcome:  No Answer   Care Coordination Interventions:  No, not indicated    Kemper Durie RN, MSN, CCM [facility], [facility] RN Care Coordinator Direct Dial: [phone] / Main [phone] Fax [phone] Email: [email or web address] Website: [email or web address]

## 2023-06-28 NOTE — Telephone Encounter (Signed)
Spoke with the patient and he is scheduled with Dr. Gilda Crease for a left arm fistulagram on 07/08/23 with a 11:30 am arrival time to the [facility]. Pre-procedure instructions were discussed and will be mailed.

## 2023-06-29 ENCOUNTER — Encounter: Payer: Medicare HMO | Admitting: Internal Medicine

## 2023-06-29 DIAGNOSIS — E11622 Type 2 diabetes mellitus with other skin ulcer: Secondary | ICD-10-CM | POA: Diagnosis not present

## 2023-06-29 DIAGNOSIS — Z992 Dependence on renal dialysis: Secondary | ICD-10-CM | POA: Diagnosis not present

## 2023-06-29 DIAGNOSIS — Z794 Long term (current) use of insulin: Secondary | ICD-10-CM | POA: Diagnosis not present

## 2023-06-29 DIAGNOSIS — I120 Hypertensive chronic kidney disease with stage 5 chronic kidney disease or end stage renal disease: Secondary | ICD-10-CM | POA: Diagnosis not present

## 2023-06-29 DIAGNOSIS — L97822 Non-pressure chronic ulcer of other part of left lower leg with fat layer exposed: Secondary | ICD-10-CM | POA: Diagnosis not present

## 2023-06-29 DIAGNOSIS — E1122 Type 2 diabetes mellitus with diabetic chronic kidney disease: Secondary | ICD-10-CM | POA: Diagnosis not present

## 2023-06-29 DIAGNOSIS — I890 Lymphedema, not elsewhere classified: Secondary | ICD-10-CM | POA: Diagnosis not present

## 2023-06-29 DIAGNOSIS — Z792 Long term (current) use of antibiotics: Secondary | ICD-10-CM | POA: Diagnosis not present

## 2023-06-29 DIAGNOSIS — N186 End stage renal disease: Secondary | ICD-10-CM | POA: Diagnosis not present

## 2023-06-30 DIAGNOSIS — Z992 Dependence on renal dialysis: Secondary | ICD-10-CM | POA: Diagnosis not present

## 2023-06-30 DIAGNOSIS — N186 End stage renal disease: Secondary | ICD-10-CM | POA: Diagnosis not present

## 2023-06-30 DIAGNOSIS — N2581 Secondary hyperparathyroidism of renal origin: Secondary | ICD-10-CM | POA: Diagnosis not present

## 2023-06-30 NOTE — Progress Notes (Signed)
chronic ulcer of other part of left lower leg with fat layer exposed Chronic venous hypertension (idiopathic) with ulcer of left lower extremity Lymphedema, not elsewhere classified Chronic venous hypertension (idiopathic) with inflammation of right lower extremity Type 2 diabetes mellitus with other skin ulcer Plan Hemler, [name] ([id number]) [id number] Page 10 of 11 Follow-up Appointments: Return Appointment in 1 week. Home Health: Home Health Company: - [facility] for wound care. May utilize formulary equivalent dressing for wound treatment orders unless otherwise specified. Home Health Nurse may visit PRN to address patients wound care needs. [name] [phone] Change on Monday and Friday. Wound Care Center will change on Wednesday Scheduled days for dressing changes to be completed; exception, patient has scheduled wound care visit that day. **Please direct any NON-WOUND related issues/requests for orders to patient's Primary Care Physician. **If current dressing causes regression in wound condition, may D/C ordered dressing product/s and apply Normal Saline Moist Dressing daily until next Wound Healing Center or Other MD appointment. **Notify  Wound Healing Center of regression in wound condition at [phone]. Bathing/ Shower/ Hygiene: May shower with wound dressing protected with water repellent cover or cast protector. No tub bath. Anesthetic (Use 'Patient Medications' Section for Anesthetic Order Entry): Lidocaine applied to wound bed WOUND #22: - Lower Leg Wound Laterality: Left Cleanser: Soap and Water 3 x Per Week/30 Days Discharge Instructions: Gently cleanse wound with antibacterial soap, rinse and pat dry prior to dressing wounds Cleanser: Wound Cleanser 3 x Per Week/30 Days Discharge Instructions: Wash your hands with soap and water. Remove old dressing, discard into plastic bag and place into trash. Cleanse the wound with Wound Cleanser prior to applying [name] clean dressing using gauze sponges, not tissues or cotton balls. Do not scrub or use excessive force. Pat dry using gauze sponges, not tissue or cotton balls. Prim Dressing: Hydrofera Blue Ready Transfer Foam, 2.5x2.5 (in/in) 3 x Per Week/30 Days ary Discharge Instructions: Apply Hydrofera Blue Ready to wound bed as directed Secondary Dressing: Zetuvit Plus 4x4 (in/in) 3 x Per Week/30 Days Secured With: D[employer] Food Dressing, Latex-free, Size 5, Small-Head / Shoulder / Thigh 3 x Per Week/30 Days Discharge Instructions: over wrap for comfort Com pression Wrap: Urgo K2, two layer compression system, large 3 x Per Week/30 Days 1. No change in the primary dressings. This will continue until this closes. 2. He has compression pumps he is using it once [name] day. He works nights he finds it cumbersome to do this more frequently #3 will need to discuss what stockings he has as this gets closer to closing. Electronic Signature(s) Signed: 06/29/2023 4:47:46 PM By: [name] Najjar MD Entered By: [name] on 06/29/2023 05:45:24 -------------------------------------------------------------------------------- SuperBill Details Patient Name: Date of Service: [name] LT, CO SLO  [name] 06/29/2023 Medical Record Number: [id number] Patient Account Number: [id number] Date of Birth/Sex: Treating RN: 01/04/1971 (52 y.o. 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 Do not scrub or use excessive force. Pat dry using gauze sponges, not tissue or cotton balls. Prim Dressing: Hydrofera Blue Ready Transfer Foam, 2.5x2.5 (in/in) 3 x Per Week/30 Days ary Discharge Instructions: Apply Hydrofera Blue Ready to wound bed as directed Secondary Dressing: Zetuvit Plus 4x4 (in/in) 3 x Per Week/30 Days Secured With: [employer] Food Dressing, Latex-free, Size 5, Small-Head / Shoulder / Thigh 3 x Per Week/30 Days Discharge Instructions: over wrap for comfort Compression Wrap: Urgo K2, two layer compression system, large 3 x Per Week/30 Days Electronic Signature(s) Signed: 06/29/2023 4:47:46 PM By: [name] Najjar MD Signed: 06/30/2023 3:34:18 PM By: [name] Pax RN Entered By: [name] on 06/29/2023 12:00:17 -------------------------------------------------------------------------------- Problem List Details Patient Name: Date of Service: [name] LT, [name] 06/29/2023 8:00 [name] Medical Record Number: [id number] Patient Account Number: [id number] Date of Birth/Sex: Treating RN: [birth year] (52 y.o. [name] Primary Care Provider: Maudie [name] Other Clinician: Referring Provider: Treating Provider/Extender: [name] in Treatment: 19 Active Problems ICD-10 Encounter Code Description Active Date MDM Diagnosis L97.822 Non-pressure chronic ulcer of other part of left lower leg with fat layer exposed6/19/2024 No Yes I87.312 Chronic venous hypertension (idiopathic) with ulcer of left lower  extremity 02/16/2023 No Yes [name], [name] ([id number]) [id number] Page 6 of 11 I89.0 Lymphedema, not elsewhere classified 03/23/2023 No Yes I87.321 Chronic venous hypertension (idiopathic) with inflammation of right lower 02/16/2023 No Yes extremity E11.622 Type 2 diabetes mellitus with other skin ulcer 02/16/2023 No Yes Inactive Problems ICD-10 Code Description Active Date Inactive Date L97.522 Non-pressure chronic ulcer of other part of left foot with fat layer exposed 03/16/2023 03/16/2023 Resolved Problems Electronic Signature(s) Signed: 06/29/2023 4:47:46 PM By: [name] Najjar MD Entered By: [name] on 06/29/2023 05:41:29 -------------------------------------------------------------------------------- Progress Note Details Patient Name: Date of Service: [name] LT, [name] 06/29/2023 8:00 [name] Medical Record Number: [id number] Patient Account Number: [id number] Date of Birth/Sex: Treating RN: 1970-11-10 (52 y.o. [name] Primary Care Provider: Maudie [name] Other Clinician: Referring Provider: Treating Provider/Extender: [name] in Treatment: 19 Subjective History of Present Illness (HPI) 12/28/17; this is [name] now 52 year old man who is [name] type II diabetic. He was hospitalized from 10/01/17 through 10/19/17. He had an MSSA soft tissue and skin infection. 2 open areas on the left leg were identified he has [name] smaller area on the left medial calf superiorly just below the knee and [name] wound just above the left ankle on the posterior medial aspect. I think both of these were surgical IandD sites when he was in the hospital. He was discharged with [name] wound VAC at that point however this is since been taken off. He follows with Dr. Sampson Goon for the [facility] and he is still on chronic Keflex at 500 twice [name] day. At that time he was hospitalized his hemoglobin A1c was 15.1 however if I'm reading his endocrinologist notes  correctly that is improved. He has been following with Dr. Lorretta Harp at [facility] vascular and he has been applying calcium alginate and Unna boots. He has home health changing the dressing. They have also been attempting to get him external compression pumps although the patient is unaware whether they've been approved by insurance at this point. as mentioned he has [name] smaller clean wound on the right lateral calf just below the knee and he has [name] much larger area just above the left ankle medially and  chronic ulcer of other part of left lower leg with fat layer exposed Chronic venous hypertension (idiopathic) with ulcer of left lower extremity Lymphedema, not elsewhere classified Chronic venous hypertension (idiopathic) with inflammation of right lower extremity Type 2 diabetes mellitus with other skin ulcer Plan Hemler, [name] ([id number]) [id number] Page 10 of 11 Follow-up Appointments: Return Appointment in 1 week. Home Health: Home Health Company: - [facility] for wound care. May utilize formulary equivalent dressing for wound treatment orders unless otherwise specified. Home Health Nurse may visit PRN to address patients wound care needs. [name] [phone] Change on Monday and Friday. Wound Care Center will change on Wednesday Scheduled days for dressing changes to be completed; exception, patient has scheduled wound care visit that day. **Please direct any NON-WOUND related issues/requests for orders to patient's Primary Care Physician. **If current dressing causes regression in wound condition, may D/C ordered dressing product/s and apply Normal Saline Moist Dressing daily until next Wound Healing Center or Other MD appointment. **Notify  Wound Healing Center of regression in wound condition at [phone]. Bathing/ Shower/ Hygiene: May shower with wound dressing protected with water repellent cover or cast protector. No tub bath. Anesthetic (Use 'Patient Medications' Section for Anesthetic Order Entry): Lidocaine applied to wound bed WOUND #22: - Lower Leg Wound Laterality: Left Cleanser: Soap and Water 3 x Per Week/30 Days Discharge Instructions: Gently cleanse wound with antibacterial soap, rinse and pat dry prior to dressing wounds Cleanser: Wound Cleanser 3 x Per Week/30 Days Discharge Instructions: Wash your hands with soap and water. Remove old dressing, discard into plastic bag and place into trash. Cleanse the wound with Wound Cleanser prior to applying [name] clean dressing using gauze sponges, not tissues or cotton balls. Do not scrub or use excessive force. Pat dry using gauze sponges, not tissue or cotton balls. Prim Dressing: Hydrofera Blue Ready Transfer Foam, 2.5x2.5 (in/in) 3 x Per Week/30 Days ary Discharge Instructions: Apply Hydrofera Blue Ready to wound bed as directed Secondary Dressing: Zetuvit Plus 4x4 (in/in) 3 x Per Week/30 Days Secured With: [employer] Food Dressing, Latex-free, Size 5, Small-Head / Shoulder / Thigh 3 x Per Week/30 Days Discharge Instructions: over wrap for comfort Com pression Wrap: Urgo K2, two layer compression system, large 3 x Per Week/30 Days 1. No change in the primary dressings. This will continue until this closes. 2. He has compression pumps he is using it once [name] day. He works nights he finds it cumbersome to do this more frequently #3 will need to discuss what stockings he has as this gets closer to closing. Electronic Signature(s) Signed: 06/29/2023 4:47:46 PM By: [name] Najjar MD Entered By: [name] on 06/29/2023 05:45:24 -------------------------------------------------------------------------------- SuperBill Details Patient Name: Date of Service: [name] LT, CO SLO  [name] 06/29/2023 Medical Record Number: [id number] Patient Account Number: [id number] Date of Birth/Sex: Treating RN: 01/04/1971 (52 y.o. [name]) [name] Primary Care Provider: Maudie [name] Other Clinician: Referring Provider: Treating Provider/Extender: [name], [name] in Treatment: 19 Diagnosis Coding ICD-10 Codes Code Description [phone] Non-pressure chronic ulcer of other part of left lower leg with fat layer exposed I87.312 Chronic venous hypertension (idiopathic) with ulcer of left lower extremity I89.0 Lymphedema, not elsewhere classified I87.321 Chronic venous hypertension (idiopathic) with inflammation of right lower extremity E11.622 Type 2 diabetes mellitus with other skin ulcer Facility Procedures Physician Procedures : CPT4 Code Description Modifier [id number] 99213 - WC PHYS LEVEL 3 - EST PT ICD-10 Diagnosis Description L97.822 Non-pressure  chronic ulcer of other part of left lower leg with fat layer exposed Chronic venous hypertension (idiopathic) with ulcer of left lower extremity Lymphedema, not elsewhere classified Chronic venous hypertension (idiopathic) with inflammation of right lower extremity Type 2 diabetes mellitus with other skin ulcer Plan Hemler, [name] ([id number]) [id number] Page 10 of 11 Follow-up Appointments: Return Appointment in 1 week. Home Health: Home Health Company: - [facility] for wound care. May utilize formulary equivalent dressing for wound treatment orders unless otherwise specified. Home Health Nurse may visit PRN to address patients wound care needs. [name] [phone] Change on Monday and Friday. Wound Care Center will change on Wednesday Scheduled days for dressing changes to be completed; exception, patient has scheduled wound care visit that day. **Please direct any NON-WOUND related issues/requests for orders to patient's Primary Care Physician. **If current dressing causes regression in wound condition, may D/C ordered dressing product/s and apply Normal Saline Moist Dressing daily until next Wound Healing Center or Other MD appointment. **Notify  Wound Healing Center of regression in wound condition at [phone]. Bathing/ Shower/ Hygiene: May shower with wound dressing protected with water repellent cover or cast protector. No tub bath. Anesthetic (Use 'Patient Medications' Section for Anesthetic Order Entry): Lidocaine applied to wound bed WOUND #22: - Lower Leg Wound Laterality: Left Cleanser: Soap and Water 3 x Per Week/30 Days Discharge Instructions: Gently cleanse wound with antibacterial soap, rinse and pat dry prior to dressing wounds Cleanser: Wound Cleanser 3 x Per Week/30 Days Discharge Instructions: Wash your hands with soap and water. Remove old dressing, discard into plastic bag and place into trash. Cleanse the wound with Wound Cleanser prior to applying [name] clean dressing using gauze sponges, not tissues or cotton balls. Do not scrub or use excessive force. Pat dry using gauze sponges, not tissue or cotton balls. Prim Dressing: Hydrofera Blue Ready Transfer Foam, 2.5x2.5 (in/in) 3 x Per Week/30 Days ary Discharge Instructions: Apply Hydrofera Blue Ready to wound bed as directed Secondary Dressing: Zetuvit Plus 4x4 (in/in) 3 x Per Week/30 Days Secured With: [employer] Food Dressing, Latex-free, Size 5, Small-Head / Shoulder / Thigh 3 x Per Week/30 Days Discharge Instructions: over wrap for comfort Com pression Wrap: Urgo K2, two layer compression system, large 3 x Per Week/30 Days 1. No change in the primary dressings. This will continue until this closes. 2. He has compression pumps he is using it once [name] day. He works nights he finds it cumbersome to do this more frequently #3 will need to discuss what stockings he has as this gets closer to closing. Electronic Signature(s) Signed: 06/29/2023 4:47:46 PM By: [name] Najjar MD Entered By: [name] on 06/29/2023 05:45:24 -------------------------------------------------------------------------------- SuperBill Details Patient Name: Date of Service: [name] LT, CO SLO  [name] 06/29/2023 Medical Record Number: [id number] Patient Account Number: [id number] Date of Birth/Sex: Treating RN: 01/04/1971 (52 y.o. [name]) [name] Primary Care Provider: Maudie [name] Other Clinician: Referring Provider: Treating Provider/Extender: [name], [name] in Treatment: 19 Diagnosis Coding ICD-10 Codes Code Description [phone] Non-pressure chronic ulcer of other part of left lower leg with fat layer exposed I87.312 Chronic venous hypertension (idiopathic) with ulcer of left lower extremity I89.0 Lymphedema, not elsewhere classified I87.321 Chronic venous hypertension (idiopathic) with inflammation of right lower extremity E11.622 Type 2 diabetes mellitus with other skin ulcer Facility Procedures Physician Procedures : CPT4 Code Description Modifier [id number] 99213 - WC PHYS LEVEL 3 - EST PT ICD-10 Diagnosis Description L97.822 Non-pressure  [name], [name] ([id number]) [id number] Page 1 of 11 Visit Report for 06/29/2023 HPI Details Patient Name: Date of Service: [name] LT, [name] 06/29/2023 8:00 [name] Medical Record Number: [id number] Patient Account Number: [id number] Date of Birth/Sex: Treating RN: 1971/08/06 (52 y.o. [name]) [name] Primary Care Provider: Maudie [name] Other Clinician: Referring Provider: Treating Provider/Extender: [name] in Treatment: 19 History of Present Illness HPI Description: 12/28/17; this is [name] now 52 year old man who is [name] type II diabetic. He was hospitalized from 10/01/17 through 10/19/17. He had an MSSA soft tissue and skin infection. 2 open areas on the left leg were identified he has [name] smaller area on the left medial calf superiorly just below the knee and [name] wound just above the left ankle on the posterior medial aspect. I think both of these were surgical IandD sites when he was in the hospital. He was discharged with [name] wound VAC at that point however this is since been taken off. He follows with Dr. Sampson Goon for the [facility] and he is still on chronic Keflex at 500 twice [name] day. At that time he was hospitalized his hemoglobin A1c was 15.1 however if I'm reading his endocrinologist notes correctly that is improved. He has been following with Dr. Lorretta Harp at vein and vascular and he has been applying calcium alginate and Unna boots. He has home health changing the dressing. They have also been attempting to get him external compression pumps although the patient is unaware whether they've been approved by insurance at this point. as mentioned he has [name] smaller clean wound on the right lateral calf just below the knee and he has [name] much larger area just above the left ankle medially and posteriorly. Our intake nurse reported greenish purulent looking drainage.the patient did have surgical material sent to pathology in February. This  showed chronic abscess The patient also has lymphedema stage III in the left greater than right lower extremities. He has [name] history of blisters with wounds but these of all were always healed. The patient thinks that the lymphedema may have been present since he was about 52 years old i.e. about 30 years. He does not have graded pressure stockings and has not worn stockings. He does not have [name] distant history of DVT PE or phlebitis. He has not been systemically unwell fever no chills. He states that his Lasix is recently been reduced. He tells me his kidney function is at "30%" and he has been followed by Dr. Thedore Mins of nephrology. At one point he was on Lasix 80 twice [name] day however that's been cut back and he is now on Lasix at 20 twice [name] day. The patient has [name] history of PAD listed in his records although he comes from Dr. Lorretta Harp I don't think is felt to have significant PAD. ABIs in our clinic were noncompressible bilaterally. 01/04/18; patient has [name] large wound on the left lateral lower calf and [name] small wound on the left medial upper calf. He has been to see his nephrologist who changed him to Demadex 40 mg [name] day. I'm hopeful this will help with his systemic fluid overload. He also has stage III lymphedema. Really no change in the 2 wounds since last week 01/11/18; the patient is down 13 pounds. He put his stage III lymphedema left leg in 4K compression last week and there is less edema fluid however we still haven't been able to communicate with home health but  balls. Do not scrub or use excessive force. Pat dry using gauze sponges, not tissue or cotton balls. Prim Dressing: Hydrofera Blue Ready Transfer Foam, 2.5x2.5 (in/in) 3 x Per Week/30 Days ary Discharge Instructions: Apply Hydrofera Blue Ready to wound bed as directed Secondary Dressing: Zetuvit Plus 4x4 (in/in) 3 x Per Week/30 Days Secured With: [employer] Food Dressing, Latex-free, Size 5, Small-Head / Shoulder / Thigh 3 x Per Week/30 Days Discharge Instructions: over wrap for comfort Compression Wrap: Urgo K2, two layer compression system, large 3 x Per Week/30 Days Electronic Signature(s) Signed: 06/29/2023 4:47:46 PM By: [name] Najjar MD Signed: 06/30/2023 3:34:18 PM By: [name] Pax RN Entered By: [name] on 06/29/2023 12:00:17 -------------------------------------------------------------------------------- Problem List Details Patient Name: Date of Service: [name] LT, [name] 06/29/2023 8:00 [name] Medical Record Number: [id number] Patient Account Number: [id number] Date of Birth/Sex: Treating RN: [birth year] (52 y.o. [name] Primary Care Provider: Maudie [name] Other Clinician: Referring Provider: Treating Provider/Extender: [name] in Treatment: 19 Active Problems ICD-10 Encounter Code Description Active Date MDM Diagnosis L97.822 Non-pressure chronic ulcer of other part of left lower leg with fat layer exposed6/19/2024 No Yes I87.312 Chronic venous hypertension (idiopathic) with ulcer of left lower  extremity 02/16/2023 No Yes [name], [name] ([id number]) [id number] Page 6 of 11 I89.0 Lymphedema, not elsewhere classified 03/23/2023 No Yes I87.321 Chronic venous hypertension (idiopathic) with inflammation of right lower 02/16/2023 No Yes extremity E11.622 Type 2 diabetes mellitus with other skin ulcer 02/16/2023 No Yes Inactive Problems ICD-10 Code Description Active Date Inactive Date L97.522 Non-pressure chronic ulcer of other part of left foot with fat layer exposed 03/16/2023 03/16/2023 Resolved Problems Electronic Signature(s) Signed: 06/29/2023 4:47:46 PM By: [name] Najjar MD Entered By: [name] on 06/29/2023 05:41:29 -------------------------------------------------------------------------------- Progress Note Details Patient Name: Date of Service: [name] LT, [name] 06/29/2023 8:00 [name] Medical Record Number: [id number] Patient Account Number: [id number] Date of Birth/Sex: Treating RN: 1970-11-10 (52 y.o. [name] Primary Care Provider: Maudie [name] Other Clinician: Referring Provider: Treating Provider/Extender: [name] in Treatment: 19 Subjective History of Present Illness (HPI) 12/28/17; this is [name] now 52 year old man who is [name] type II diabetic. He was hospitalized from 10/01/17 through 10/19/17. He had an MSSA soft tissue and skin infection. 2 open areas on the left leg were identified he has [name] smaller area on the left medial calf superiorly just below the knee and [name] wound just above the left ankle on the posterior medial aspect. I think both of these were surgical IandD sites when he was in the hospital. He was discharged with [name] wound VAC at that point however this is since been taken off. He follows with Dr. Sampson Goon for the [facility] and he is still on chronic Keflex at 500 twice [name] day. At that time he was hospitalized his hemoglobin A1c was 15.1 however if I'm reading his endocrinologist notes  correctly that is improved. He has been following with Dr. Lorretta Harp at [facility] and he has been applying calcium alginate and Unna boots. He has home health changing the dressing. They have also been attempting to get him external compression pumps although the patient is unaware whether they've been approved by insurance at this point. as mentioned he has [name] smaller clean wound on the right lateral calf just below the knee and he has [name] much larger area just above the left ankle medially and  chronic ulcer of other part of left lower leg with fat layer exposed Chronic venous hypertension (idiopathic) with ulcer of left lower extremity Lymphedema, not elsewhere classified Chronic venous hypertension (idiopathic) with inflammation of right lower extremity Type 2 diabetes mellitus with other skin ulcer Plan Hemler, [name] ([id number]) [id number] Page 10 of 11 Follow-up Appointments: Return Appointment in 1 week. Home Health: Home Health Company: - [facility] for wound care. May utilize formulary equivalent dressing for wound treatment orders unless otherwise specified. Home Health Nurse may visit PRN to address patients wound care needs. [name] [phone] Change on Monday and Friday. Wound Care Center will change on Wednesday Scheduled days for dressing changes to be completed; exception, patient has scheduled wound care visit that day. **Please direct any NON-WOUND related issues/requests for orders to patient's Primary Care Physician. **If current dressing causes regression in wound condition, may D/C ordered dressing product/s and apply Normal Saline Moist Dressing daily until next Wound Healing Center or Other MD appointment. **Notify  Wound Healing Center of regression in wound condition at [phone]. Bathing/ Shower/ Hygiene: May shower with wound dressing protected with water repellent cover or cast protector. No tub bath. Anesthetic (Use 'Patient Medications' Section for Anesthetic Order Entry): Lidocaine applied to wound bed WOUND #22: - Lower Leg Wound Laterality: Left Cleanser: Soap and Water 3 x Per Week/30 Days Discharge Instructions: Gently cleanse wound with antibacterial soap, rinse and pat dry prior to dressing wounds Cleanser: Wound Cleanser 3 x Per Week/30 Days Discharge Instructions: Wash your hands with soap and water. Remove old dressing, discard into plastic bag and place into trash. Cleanse the wound with Wound Cleanser prior to applying [name] clean dressing using gauze sponges, not tissues or cotton balls. Do not scrub or use excessive force. Pat dry using gauze sponges, not tissue or cotton balls. Prim Dressing: Hydrofera Blue Ready Transfer Foam, 2.5x2.5 (in/in) 3 x Per Week/30 Days ary Discharge Instructions: Apply Hydrofera Blue Ready to wound bed as directed Secondary Dressing: Zetuvit Plus 4x4 (in/in) 3 x Per Week/30 Days Secured With: [employer] Food Dressing, Latex-free, Size 5, Small-Head / Shoulder / Thigh 3 x Per Week/30 Days Discharge Instructions: over wrap for comfort Com pression Wrap: Urgo K2, two layer compression system, large 3 x Per Week/30 Days 1. No change in the primary dressings. This will continue until this closes. 2. He has compression pumps he is using it once [name] day. He works nights he finds it cumbersome to do this more frequently #3 will need to discuss what stockings he has as this gets closer to closing. Electronic Signature(s) Signed: 06/29/2023 4:47:46 PM By: [name] Najjar MD Entered By: [name] on 06/29/2023 05:45:24 -------------------------------------------------------------------------------- SuperBill Details Patient Name: Date of Service: [name] LT, CO SLO  [name] 06/29/2023 Medical Record Number: [id number] Patient Account Number: [id number] Date of Birth/Sex: Treating RN: 01/04/1971 (52 y.o. 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 Do not scrub or use excessive force. Pat dry using gauze sponges, not tissue or cotton balls. Prim Dressing: Hydrofera Blue Ready Transfer Foam, 2.5x2.5 (in/in) 3 x Per Week/30 Days ary Discharge Instructions: Apply Hydrofera Blue Ready to wound bed as directed Secondary Dressing: Zetuvit Plus 4x4 (in/in) 3 x Per Week/30 Days Secured With: [employer] Dressing, Latex-free, Size 5, Small-Head / Shoulder / Thigh 3 x Per Week/30 Days Discharge Instructions: over wrap for comfort Compression Wrap: Urgo K2, two layer compression system, large 3 x Per Week/30 Days Electronic Signature(s) Signed: 06/29/2023 4:47:46 PM By: [name] Najjar MD Signed: 06/30/2023 3:34:18 PM By: [name] Pax RN Entered By: [name] on 06/29/2023 12:00:17 -------------------------------------------------------------------------------- Problem List Details Patient Name: Date of Service: [name] LT, [name] 06/29/2023 8:00 [name] Medical Record Number: [id number] Patient Account Number: [id number] Date of Birth/Sex: Treating RN: [birth year] (52 y.o. [name] Primary Care Provider: Maudie [name] Other Clinician: Referring Provider: Treating Provider/Extender: [name] in Treatment: 19 Active Problems ICD-10 Encounter Code Description Active Date MDM Diagnosis L97.822 Non-pressure chronic ulcer of other part of left lower leg with fat layer exposed6/19/2024 No Yes I87.312 Chronic venous hypertension (idiopathic) with ulcer of left lower  extremity 02/16/2023 No Yes [name], [name] ([id number]) [id number] Page 6 of 11 I89.0 Lymphedema, not elsewhere classified 03/23/2023 No Yes I87.321 Chronic venous hypertension (idiopathic) with inflammation of right lower 02/16/2023 No Yes extremity E11.622 Type 2 diabetes mellitus with other skin ulcer 02/16/2023 No Yes Inactive Problems ICD-10 Code Description Active Date Inactive Date L97.522 Non-pressure chronic ulcer of other part of left foot with fat layer exposed 03/16/2023 03/16/2023 Resolved Problems Electronic Signature(s) Signed: 06/29/2023 4:47:46 PM By: [name] Najjar MD Entered By: [name] on 06/29/2023 05:41:29 -------------------------------------------------------------------------------- Progress Note Details Patient Name: Date of Service: [name] LT, [name] 06/29/2023 8:00 [name] Medical Record Number: [id number] Patient Account Number: [id number] Date of Birth/Sex: Treating RN: 1970-11-10 (52 y.o. [name] Primary Care Provider: Maudie [name] Other Clinician: Referring Provider: Treating Provider/Extender: [name] in Treatment: 19 Subjective History of Present Illness (HPI) 12/28/17; this is [name] now 52 year old man who is [name] type II diabetic. He was hospitalized from 10/01/17 through 10/19/17. He had an MSSA soft tissue and skin infection. 2 open areas on the left leg were identified he has [name] smaller area on the left medial calf superiorly just below the knee and [name] wound just above the left ankle on the posterior medial aspect. I think both of these were surgical IandD sites when he was in the hospital. He was discharged with [name] wound VAC at that point however this is since been taken off. He follows with Dr. Sampson Goon for the [facility] and he is still on chronic Keflex at 500 twice [name] day. At that time he was hospitalized his hemoglobin A1c was 15.1 however if I'm reading his endocrinologist notes  correctly that is improved. He has been following with Dr. Lorretta Harp at vein and vascular and he has been applying calcium alginate and Unna boots. He has home health changing the dressing. They have also been attempting to get him external compression pumps although the patient is unaware whether they've been approved by insurance at this point. as mentioned he has [name] smaller clean wound on the right lateral calf just below the knee and he has [name] much larger area just above the left ankle medially and  [name], [name] ([id number]) [id number] Page 1 of 11 Visit Report for 06/29/2023 HPI Details Patient Name: Date of Service: [name] LT, [name] 06/29/2023 8:00 [name] Medical Record Number: [id number] Patient Account Number: [id number] Date of Birth/Sex: Treating RN: 1971/08/06 (52 y.o. [name]) [name] Primary Care Provider: Maudie [name] Other Clinician: Referring Provider: Treating Provider/Extender: [name] in Treatment: 19 History of Present Illness HPI Description: 12/28/17; this is [name] now 52 year old man who is [name] type II diabetic. He was hospitalized from 10/01/17 through 10/19/17. He had an MSSA soft tissue and skin infection. 2 open areas on the left leg were identified he has [name] smaller area on the left medial calf superiorly just below the knee and [name] wound just above the left ankle on the posterior medial aspect. I think both of these were surgical IandD sites when he was in the hospital. He was discharged with [name] wound VAC at that point however this is since been taken off. He follows with Dr. Sampson Goon for the [facility] and he is still on chronic Keflex at 500 twice [name] day. At that time he was hospitalized his hemoglobin A1c was 15.1 however if I'm reading his endocrinologist notes correctly that is improved. He has been following with Dr. Lorretta Harp at vein and vascular and he has been applying calcium alginate and Unna boots. He has home health changing the dressing. They have also been attempting to get him external compression pumps although the patient is unaware whether they've been approved by insurance at this point. as mentioned he has [name] smaller clean wound on the right lateral calf just below the knee and he has [name] much larger area just above the left ankle medially and posteriorly. Our intake nurse reported greenish purulent looking drainage.the patient did have surgical material sent to pathology in February. This  showed chronic abscess The patient also has lymphedema stage III in the left greater than right lower extremities. He has [name] history of blisters with wounds but these of all were always healed. The patient thinks that the lymphedema may have been present since he was about 52 years old i.e. about 30 years. He does not have graded pressure stockings and has not worn stockings. He does not have [name] distant history of DVT PE or phlebitis. He has not been systemically unwell fever no chills. He states that his Lasix is recently been reduced. He tells me his kidney function is at "30%" and he has been followed by Dr. Thedore Mins of nephrology. At one point he was on Lasix 80 twice [name] day however that's been cut back and he is now on Lasix at 20 twice [name] day. The patient has [name] history of PAD listed in his records although he comes from Dr. Lorretta Harp I don't think is felt to have significant PAD. ABIs in our clinic were noncompressible bilaterally. 01/04/18; patient has [name] large wound on the left lateral lower calf and [name] small wound on the left medial upper calf. He has been to see his nephrologist who changed him to Demadex 40 mg [name] day. I'm hopeful this will help with his systemic fluid overload. He also has stage III lymphedema. Really no change in the 2 wounds since last week 01/11/18; the patient is down 13 pounds. He put his stage III lymphedema left leg in 4K compression last week and there is less edema fluid however we still haven't been able to communicate with home health but  chronic ulcer of other part of left lower leg with fat layer exposed Chronic venous hypertension (idiopathic) with ulcer of left lower extremity Lymphedema, not elsewhere classified Chronic venous hypertension (idiopathic) with inflammation of right lower extremity Type 2 diabetes mellitus with other skin ulcer Plan Hemler, [name] ([id number]) [id number] Page 10 of 11 Follow-up Appointments: Return Appointment in 1 week. Home Health: Home Health Company: - [facility] for wound care. May utilize formulary equivalent dressing for wound treatment orders unless otherwise specified. Home Health Nurse may visit PRN to address patients wound care needs. [name] [phone] Change on Monday and Friday. Wound Care Center will change on Wednesday Scheduled days for dressing changes to be completed; exception, patient has scheduled wound care visit that day. **Please direct any NON-WOUND related issues/requests for orders to patient's Primary Care Physician. **If current dressing causes regression in wound condition, may D/C ordered dressing product/s and apply Normal Saline Moist Dressing daily until next Wound Healing Center or Other MD appointment. **Notify  Wound Healing Center of regression in wound condition at [phone]. Bathing/ Shower/ Hygiene: May shower with wound dressing protected with water repellent cover or cast protector. No tub bath. Anesthetic (Use 'Patient Medications' Section for Anesthetic Order Entry): Lidocaine applied to wound bed WOUND #22: - Lower Leg Wound Laterality: Left Cleanser: Soap and Water 3 x Per Week/30 Days Discharge Instructions: Gently cleanse wound with antibacterial soap, rinse and pat dry prior to dressing wounds Cleanser: Wound Cleanser 3 x Per Week/30 Days Discharge Instructions: Wash your hands with soap and water. Remove old dressing, discard into plastic bag and place into trash. Cleanse the wound with Wound Cleanser prior to applying [name] clean dressing using gauze sponges, not tissues or cotton balls. Do not scrub or use excessive force. Pat dry using gauze sponges, not tissue or cotton balls. Prim Dressing: Hydrofera Blue Ready Transfer Foam, 2.5x2.5 (in/in) 3 x Per Week/30 Days ary Discharge Instructions: Apply Hydrofera Blue Ready to wound bed as directed Secondary Dressing: Zetuvit Plus 4x4 (in/in) 3 x Per Week/30 Days Secured With: [employer] Food Dressing, Latex-free, Size 5, Small-Head / Shoulder / Thigh 3 x Per Week/30 Days Discharge Instructions: over wrap for comfort Com pression Wrap: Urgo K2, two layer compression system, large 3 x Per Week/30 Days 1. No change in the primary dressings. This will continue until this closes. 2. He has compression pumps he is using it once [name] day. He works nights he finds it cumbersome to do this more frequently #3 will need to discuss what stockings he has as this gets closer to closing. Electronic Signature(s) Signed: 06/29/2023 4:47:46 PM By: [name] Najjar MD Entered By: [name] on 06/29/2023 05:45:24 -------------------------------------------------------------------------------- SuperBill Details Patient Name: Date of Service: [name] LT, CO SLO  [name] 06/29/2023 Medical Record Number: [id number] Patient Account Number: [id number] Date of Birth/Sex: Treating RN: 01/04/1971 (52 y.o. [name]) [name] Primary Care Provider: Maudie [name] Other Clinician: Referring Provider: Treating Provider/Extender: [name], [name] in Treatment: 19 Diagnosis Coding ICD-10 Codes Code Description [phone] Non-pressure chronic ulcer of other part of left lower leg with fat layer exposed I87.312 Chronic venous hypertension (idiopathic) with ulcer of left lower extremity I89.0 Lymphedema, not elsewhere classified I87.321 Chronic venous hypertension (idiopathic) with inflammation of right lower extremity E11.622 Type 2 diabetes mellitus with other skin ulcer Facility Procedures Physician Procedures : CPT4 Code Description Modifier [id number] - WC PHYS LEVEL 3 - EST PT ICD-10 Diagnosis Description L97.822 Non-pressure  chronic ulcer of other part of left lower leg with fat layer exposed Chronic venous hypertension (idiopathic) with ulcer of left lower extremity Lymphedema, not elsewhere classified Chronic venous hypertension (idiopathic) with inflammation of right lower extremity Type 2 diabetes mellitus with other skin ulcer Plan Hemler, [name] ([id number]) [id number] Page 10 of 11 Follow-up Appointments: Return Appointment in 1 week. Home Health: Home Health Company: - [facility] for wound care. May utilize formulary equivalent dressing for wound treatment orders unless otherwise specified. Home Health Nurse may visit PRN to address patients wound care needs. [name] [phone] Change on Monday and Friday. Wound Care Center will change on Wednesday Scheduled days for dressing changes to be completed; exception, patient has scheduled wound care visit that day. **Please direct any NON-WOUND related issues/requests for orders to patient's Primary Care Physician. **If current dressing causes regression in wound condition, may D/C ordered dressing product/s and apply Normal Saline Moist Dressing daily until next Wound Healing Center or Other MD appointment. **Notify  Wound Healing Center of regression in wound condition at [phone]. Bathing/ Shower/ Hygiene: May shower with wound dressing protected with water repellent cover or cast protector. No tub bath. Anesthetic (Use 'Patient Medications' Section for Anesthetic Order Entry): Lidocaine applied to wound bed WOUND #22: - Lower Leg Wound Laterality: Left Cleanser: Soap and Water 3 x Per Week/30 Days Discharge Instructions: Gently cleanse wound with antibacterial soap, rinse and pat dry prior to dressing wounds Cleanser: Wound Cleanser 3 x Per Week/30 Days Discharge Instructions: Wash your hands with soap and water. Remove old dressing, discard into plastic bag and place into trash. Cleanse the wound with Wound Cleanser prior to applying [name] clean dressing using gauze sponges, not tissues or cotton balls. Do not scrub or use excessive force. Pat dry using gauze sponges, not tissue or cotton balls. Prim Dressing: Hydrofera Blue Ready Transfer Foam, 2.5x2.5 (in/in) 3 x Per Week/30 Days ary Discharge Instructions: Apply Hydrofera Blue Ready to wound bed as directed Secondary Dressing: Zetuvit Plus 4x4 (in/in) 3 x Per Week/30 Days Secured With: [employer] Food Dressing, Latex-free, Size 5, Small-Head / Shoulder / Thigh 3 x Per Week/30 Days Discharge Instructions: over wrap for comfort Com pression Wrap: Urgo K2, two layer compression system, large 3 x Per Week/30 Days 1. No change in the primary dressings. This will continue until this closes. 2. He has compression pumps he is using it once [name] day. He works nights he finds it cumbersome to do this more frequently #3 will need to discuss what stockings he has as this gets closer to closing. Electronic Signature(s) Signed: 06/29/2023 4:47:46 PM By: [name] Najjar MD Entered By: [name] on 06/29/2023 05:45:24 -------------------------------------------------------------------------------- SuperBill Details Patient Name: Date of Service: [name] LT, CO SLO  [name] 06/29/2023 Medical Record Number: [id number] Patient Account Number: [id number] Date of Birth/Sex: Treating RN: 01/04/1971 (52 y.o. [name]) [name] Primary Care Provider: Maudie [name] Other Clinician: Referring Provider: Treating Provider/Extender: [name], [name] in Treatment: 19 Diagnosis Coding ICD-10 Codes Code Description [phone] Non-pressure chronic ulcer of other part of left lower leg with fat layer exposed I87.312 Chronic venous hypertension (idiopathic) with ulcer of left lower extremity I89.0 Lymphedema, not elsewhere classified I87.321 Chronic venous hypertension (idiopathic) with inflammation of right lower extremity E11.622 Type 2 diabetes mellitus with other skin ulcer Facility Procedures Physician Procedures : CPT4 Code Description Modifier [id number] - WC PHYS LEVEL 3 - EST PT ICD-10 Diagnosis Description L97.822 Non-pressure  works nights 10/23; Hydrofera Blue under Urgo K2 compression for [name] wound on his left medial lower leg. The wound measures smaller today. Noticeable improvement in the Sperry, Eriberto [name] ([id number]) [id number] Page 4 of 11 edema he tells me he is using his external compression pumps once [name] day at least sometimes twice. He has [name] difficult work schedule and works nights 10/30; we are using Hydrofera Blue under Urgo K2 compression. Wound continues to gradually get smaller. He is using his compression pumps at home once [name] day Electronic Signature(s) Signed: 06/29/2023 4:47:46 PM By: [name] Najjar MD Entered By: [name] on 06/29/2023 05:42:27 -------------------------------------------------------------------------------- Physical Exam Details Patient Name: Date of Service: [name] LT, [name] 06/29/2023 8:00 [name] Medical Record Number: [id number] Patient Account Number: [id number] Date of Birth/Sex: Treating RN: 01-29-1971 (52 y.o. [name] Primary Care Provider: Maudie [name] Other Clinician: Referring Provider: Treating Provider/Extender: [name] in Treatment: 19 Constitutional Sitting or standing Blood Pressure is within target range for patient.. Pulse regular and within target range for patient.[name] Respirations regular, non-labored and within target range.. Temperature is normal and within the target range for the patient.[name] appears in no distress. Notes Wound exam; circular wound in the left mid lower leg. Edema control modest. No surrounding infection. Surrounding tissue however is mostly scar tissue this is contributing to difficulty in closing this wound probably Electronic Signature(s) Signed: 06/29/2023 4:47:46 PM By: [name] Najjar MD Entered By: [name] on 06/29/2023  05:43:35 -------------------------------------------------------------------------------- Physician Orders Details Patient Name: Date of Service: [name] LT, [name] 06/29/2023 8:00 [name] Medical Record Number: [id number] Patient Account Number: [id number] Date of Birth/Sex: Treating RN: 02/08/1971 (52 y.o. [name] Primary Care Provider: Maudie [name] Other Clinician: Referring Provider: Treating Provider/Extender: [name] in Treatment: 19 The following information was scribed by: [name] The information was scribed for: [name] Verbal / Phone Orders: No Diagnosis Coding Follow-up Appointments Return Appointment in 1 week. MARTAVION, BEECK [name] ([id number]) [id number] Page 5 of 11 Home Health Home Health Company: - [facility] for wound care. May utilize formulary equivalent dressing for wound treatment orders unless otherwise specified. Home Health Nurse may visit PRN to address patients wound care needs. [name] [phone] Change on Monday and Friday. Wound Care Center will change on Wednesday Scheduled days for dressing changes to be completed; exception, patient has scheduled wound care visit that day. **Please direct any NON-WOUND related issues/requests for orders to patient's Primary Care Physician. **If current dressing causes regression in wound condition, may D/C ordered dressing product/s and apply Normal Saline Moist Dressing daily until next Wound Healing Center or Other MD appointment. **Notify Wound Healing Center of regression in wound condition at [phone]. Bathing/ Shower/ Hygiene May shower with wound dressing protected with water repellent cover or cast protector. No tub bath. Anesthetic (Use 'Patient Medications' Section for Anesthetic Order Entry) Lidocaine applied to wound bed Wound Treatment Wound #22 - Lower Leg Wound Laterality: Left Cleanser: Soap and  Water 3 x Per Week/30 Days Discharge Instructions: Gently cleanse wound with antibacterial soap, rinse and pat dry prior to dressing wounds Cleanser: Wound Cleanser 3 x Per Week/30 Days Discharge Instructions: Wash your hands with soap and water. Remove old dressing, discard into plastic bag and place into trash. Cleanse the wound with Wound Cleanser prior to applying [name] clean dressing using gauze sponges, not tissues or cotton  [name], [name] ([id number]) [id number] Page 1 of 11 Visit Report for 06/29/2023 HPI Details Patient Name: Date of Service: [name] LT, [name] 06/29/2023 8:00 [name] Medical Record Number: [id number] Patient Account Number: [id number] Date of Birth/Sex: Treating RN: 1971/08/06 (52 y.o. [name]) [name] Primary Care Provider: Maudie [name] Other Clinician: Referring Provider: Treating Provider/Extender: [name] in Treatment: 19 History of Present Illness HPI Description: 12/28/17; this is [name] now 52 year old man who is [name] type II diabetic. He was hospitalized from 10/01/17 through 10/19/17. He had an MSSA soft tissue and skin infection. 2 open areas on the left leg were identified he has [name] smaller area on the left medial calf superiorly just below the knee and [name] wound just above the left ankle on the posterior medial aspect. I think both of these were surgical IandD sites when he was in the hospital. He was discharged with [name] wound VAC at that point however this is since been taken off. He follows with Dr. Sampson Goon for the [facility] and he is still on chronic Keflex at 500 twice [name] day. At that time he was hospitalized his hemoglobin A1c was 15.1 however if I'm reading his endocrinologist notes correctly that is improved. He has been following with Dr. Lorretta Harp at [facility] and he has been applying calcium alginate and Unna boots. He has home health changing the dressing. They have also been attempting to get him external compression pumps although the patient is unaware whether they've been approved by insurance at this point. as mentioned he has [name] smaller clean wound on the right lateral calf just below the knee and he has [name] much larger area just above the left ankle medially and posteriorly. Our intake nurse reported greenish purulent looking drainage.the patient did have surgical material sent to pathology in February. This  showed chronic abscess The patient also has lymphedema stage III in the left greater than right lower extremities. He has [name] history of blisters with wounds but these of all were always healed. The patient thinks that the lymphedema may have been present since he was about 52 years old i.e. about 30 years. He does not have graded pressure stockings and has not worn stockings. He does not have [name] distant history of DVT PE or phlebitis. He has not been systemically unwell fever no chills. He states that his Lasix is recently been reduced. He tells me his kidney function is at "30%" and he has been followed by Dr. Thedore Mins of nephrology. At one point he was on Lasix 80 twice [name] day however that's been cut back and he is now on Lasix at 20 twice [name] day. The patient has [name] history of PAD listed in his records although he comes from Dr. Lorretta Harp I don't think is felt to have significant PAD. ABIs in our clinic were noncompressible bilaterally. 01/04/18; patient has [name] large wound on the left lateral lower calf and [name] small wound on the left medial upper calf. He has been to see his nephrologist who changed him to Demadex 40 mg [name] day. I'm hopeful this will help with his systemic fluid overload. He also has stage III lymphedema. Really no change in the 2 wounds since last week 01/11/18; the patient is down 13 pounds. He put his stage III lymphedema left leg in 4K compression last week and there is less edema fluid however we still haven't been able to communicate with home health but  works nights 10/23; Hydrofera Blue under Urgo K2 compression for [name] wound on his left medial lower leg. The wound measures smaller today. Noticeable improvement in the Sperry, Eriberto [name] ([id number]) [id number] Page 4 of 11 edema he tells me he is using his external compression pumps once [name] day at least sometimes twice. He has [name] difficult work schedule and works nights 10/30; we are using Hydrofera Blue under Urgo K2 compression. Wound continues to gradually get smaller. He is using his compression pumps at home once [name] day Electronic Signature(s) Signed: 06/29/2023 4:47:46 PM By: [name] Najjar MD Entered By: [name] on 06/29/2023 05:42:27 -------------------------------------------------------------------------------- Physical Exam Details Patient Name: Date of Service: [name] LT, [name] 06/29/2023 8:00 [name] Medical Record Number: [id number] Patient Account Number: [id number] Date of Birth/Sex: Treating RN: 01-29-1971 (52 y.o. [name] Primary Care Provider: Maudie [name] Other Clinician: Referring Provider: Treating Provider/Extender: [name] in Treatment: 19 Constitutional Sitting or standing Blood Pressure is within target range for patient.. Pulse regular and within target range for patient.[name] Respirations regular, non-labored and within target range.. Temperature is normal and within the target range for the patient.[name] appears in no distress. Notes Wound exam; circular wound in the left mid lower leg. Edema control modest. No surrounding infection. Surrounding tissue however is mostly scar tissue this is contributing to difficulty in closing this wound probably Electronic Signature(s) Signed: 06/29/2023 4:47:46 PM By: [name] Najjar MD Entered By: [name] on 06/29/2023  05:43:35 -------------------------------------------------------------------------------- Physician Orders Details Patient Name: Date of Service: [name] LT, [name] 06/29/2023 8:00 [name] Medical Record Number: [id number] Patient Account Number: [id number] Date of Birth/Sex: Treating RN: 02/08/1971 (52 y.o. [name] Primary Care Provider: Maudie [name] Other Clinician: Referring Provider: Treating Provider/Extender: [name] in Treatment: 19 The following information was scribed by: [name] The information was scribed for: [name] Verbal / Phone Orders: No Diagnosis Coding Follow-up Appointments Return Appointment in 1 week. MARTAVION, BEECK [name] ([id number]) [id number] Page 5 of 11 Home Health Home Health Company: - [facility] for wound care. May utilize formulary equivalent dressing for wound treatment orders unless otherwise specified. Home Health Nurse may visit PRN to address patients wound care needs. [name] [phone] Change on Monday and Friday. Wound Care Center will change on Wednesday Scheduled days for dressing changes to be completed; exception, patient has scheduled wound care visit that day. **Please direct any NON-WOUND related issues/requests for orders to patient's Primary Care Physician. **If current dressing causes regression in wound condition, may D/C ordered dressing product/s and apply Normal Saline Moist Dressing daily until next Wound Healing Center or Other MD appointment. **Notify Wound Healing Center of regression in wound condition at [phone]. Bathing/ Shower/ Hygiene May shower with wound dressing protected with water repellent cover or cast protector. No tub bath. Anesthetic (Use 'Patient Medications' Section for Anesthetic Order Entry) Lidocaine applied to wound bed Wound Treatment Wound #22 - Lower Leg Wound Laterality: Left Cleanser: Soap and  Water 3 x Per Week/30 Days Discharge Instructions: Gently cleanse wound with antibacterial soap, rinse and pat dry prior to dressing wounds Cleanser: Wound Cleanser 3 x Per Week/30 Days Discharge Instructions: Wash your hands with soap and water. Remove old dressing, discard into plastic bag and place into trash. Cleanse the wound with Wound Cleanser prior to applying [name] clean dressing using gauze sponges, not tissues or cotton  balls. Do not scrub or use excessive force. Pat dry using gauze sponges, not tissue or cotton balls. Prim Dressing: Hydrofera Blue Ready Transfer Foam, 2.5x2.5 (in/in) 3 x Per Week/30 Days ary Discharge Instructions: Apply Hydrofera Blue Ready to wound bed as directed Secondary Dressing: Zetuvit Plus 4x4 (in/in) 3 x Per Week/30 Days Secured With: D[employer] Food Dressing, Latex-free, Size 5, Small-Head / Shoulder / Thigh 3 x Per Week/30 Days Discharge Instructions: over wrap for comfort Compression Wrap: Urgo K2, two layer compression system, large 3 x Per Week/30 Days Electronic Signature(s) Signed: 06/29/2023 4:47:46 PM By: [name] Najjar MD Signed: 06/30/2023 3:34:18 PM By: [name] Pax RN Entered By: [name] on 06/29/2023 12:00:17 -------------------------------------------------------------------------------- Problem List Details Patient Name: Date of Service: [name] LT, [name] 06/29/2023 8:00 [name] Medical Record Number: [id number] Patient Account Number: [id number] Date of Birth/Sex: Treating RN: [birth year] (52 y.o. [name] Primary Care Provider: Maudie [name] Other Clinician: Referring Provider: Treating Provider/Extender: [name] in Treatment: 19 Active Problems ICD-10 Encounter Code Description Active Date MDM Diagnosis L97.822 Non-pressure chronic ulcer of other part of left lower leg with fat layer exposed6/19/2024 No Yes I87.312 Chronic venous hypertension (idiopathic) with ulcer of left lower  extremity 02/16/2023 No Yes [name], [name] ([id number]) [id number] Page 6 of 11 I89.0 Lymphedema, not elsewhere classified 03/23/2023 No Yes I87.321 Chronic venous hypertension (idiopathic) with inflammation of right lower 02/16/2023 No Yes extremity E11.622 Type 2 diabetes mellitus with other skin ulcer 02/16/2023 No Yes Inactive Problems ICD-10 Code Description Active Date Inactive Date L97.522 Non-pressure chronic ulcer of other part of left foot with fat layer exposed 03/16/2023 03/16/2023 Resolved Problems Electronic Signature(s) Signed: 06/29/2023 4:47:46 PM By: [name] Najjar MD Entered By: [name] on 06/29/2023 05:41:29 -------------------------------------------------------------------------------- Progress Note Details Patient Name: Date of Service: [name] LT, [name] 06/29/2023 8:00 [name] Medical Record Number: [id number] Patient Account Number: [id number] Date of Birth/Sex: Treating RN: 1970-11-10 (52 y.o. [name] Primary Care Provider: Maudie [name] Other Clinician: Referring Provider: Treating Provider/Extender: [name] in Treatment: 19 Subjective History of Present Illness (HPI) 12/28/17; this is [name] now 52 year old man who is [name] type II diabetic. He was hospitalized from 10/01/17 through 10/19/17. He had an MSSA soft tissue and skin infection. 2 open areas on the left leg were identified he has [name] smaller area on the left medial calf superiorly just below the knee and [name] wound just above the left ankle on the posterior medial aspect. I think both of these were surgical IandD sites when he was in the hospital. He was discharged with [name] wound VAC at that point however this is since been taken off. He follows with Dr. Sampson Goon for the [facility] and he is still on chronic Keflex at 500 twice [name] day. At that time he was hospitalized his hemoglobin A1c was 15.1 however if I'm reading his endocrinologist notes  correctly that is improved. He has been following with Dr. Lorretta Harp at vein and vascular and he has been applying calcium alginate and Unna boots. He has home health changing the dressing. They have also been attempting to get him external compression pumps although the patient is unaware whether they've been approved by insurance at this point. as mentioned he has [name] smaller clean wound on the right lateral calf just below the knee and he has [name] much larger area just above the left ankle medially and  works nights 10/23; Hydrofera Blue under Urgo K2 compression for [name] wound on his left medial lower leg. The wound measures smaller today. Noticeable improvement in the Sperry, Eriberto [name] ([id number]) [id number].pdf Page 4 of 11 edema he tells me he is using his external compression pumps once [name] day at least sometimes twice. He has [name] difficult work schedule and works nights 10/30; we are using Hydrofera Blue under Urgo K2 compression. Wound continues to gradually get smaller. He is using his compression pumps at home once [name] day Electronic Signature(s) Signed: 06/29/2023 4:47:46 PM By: [name] Najjar MD Entered By: [name] on 06/29/2023 05:42:27 -------------------------------------------------------------------------------- Physical Exam Details Patient Name: Date of Service: [name] LT, [name] 06/29/2023 8:00 [name] Medical Record Number: [id number] Patient Account Number: [id number] Date of Birth/Sex: Treating RN: 01-29-1971 (52 y.o. [name] Primary Care Provider: Maudie [name] Other Clinician: Referring Provider: Treating Provider/Extender: [name] in Treatment: 19 Constitutional Sitting or standing Blood Pressure is within target range for patient.. Pulse regular and within target range for patient.[name] Respirations regular, non-labored and within target range.. Temperature is normal and within the target range for the patient.[name] appears in no distress. Notes Wound exam; circular wound in the left mid lower leg. Edema control modest. No surrounding infection. Surrounding tissue however is mostly scar tissue this is contributing to difficulty in closing this wound probably Electronic Signature(s) Signed: 06/29/2023 4:47:46 PM By: [name] Najjar MD Entered By: [name] on 06/29/2023  05:43:35 -------------------------------------------------------------------------------- Physician Orders Details Patient Name: Date of Service: [name] LT, [name] 06/29/2023 8:00 [name] Medical Record Number: [id number] Patient Account Number: [id number] Date of Birth/Sex: Treating RN: 02/08/1971 (52 y.o. [name] Primary Care Provider: Maudie [name] Other Clinician: Referring Provider: Treating Provider/Extender: [name] in Treatment: 19 The following information was scribed by: [name] The information was scribed for: [name] Verbal / Phone Orders: No Diagnosis Coding Follow-up Appointments Return Appointment in 1 week. MARTAVION, BEECK [name] ([id number]) [id number] Page 5 of 11 Home Health Home Health Company: - [facility] for wound care. May utilize formulary equivalent dressing for wound treatment orders unless otherwise specified. Home Health Nurse may visit PRN to address patients wound care needs. [name] [phone] Change on Monday and Friday. Wound Care Center will change on Wednesday Scheduled days for dressing changes to be completed; exception, patient has scheduled wound care visit that day. **Please direct any NON-WOUND related issues/requests for orders to patient's Primary Care Physician. **If current dressing causes regression in wound condition, may D/C ordered dressing product/s and apply Normal Saline Moist Dressing daily until next Wound Healing Center or Other MD appointment. **Notify Wound Healing Center of regression in wound condition at [phone]. Bathing/ Shower/ Hygiene May shower with wound dressing protected with water repellent cover or cast protector. No tub bath. Anesthetic (Use 'Patient Medications' Section for Anesthetic Order Entry) Lidocaine applied to wound bed Wound Treatment Wound #22 - Lower Leg Wound Laterality: Left Cleanser: Soap and  Water 3 x Per Week/30 Days Discharge Instructions: Gently cleanse wound with antibacterial soap, rinse and pat dry prior to dressing wounds Cleanser: Wound Cleanser 3 x Per Week/30 Days Discharge Instructions: Wash your hands with soap and water. Remove old dressing, discard into plastic bag and place into trash. Cleanse the wound with Wound Cleanser prior to applying [name] clean dressing using gauze sponges, not tissues or cotton  chronic ulcer of other part of left lower leg with fat layer exposed Chronic venous hypertension (idiopathic) with ulcer of left lower extremity Lymphedema, not elsewhere classified Chronic venous hypertension (idiopathic) with inflammation of right lower extremity Type 2 diabetes mellitus with other skin ulcer Plan Hemler, [name] ([id number]) [id number] Page 10 of 11 Follow-up Appointments: Return Appointment in 1 week. Home Health: Home Health Company: - [facility] for wound care. May utilize formulary equivalent dressing for wound treatment orders unless otherwise specified. Home Health Nurse may visit PRN to address patients wound care needs. [name] [phone] Change on Monday and Friday. Wound Care Center will change on Wednesday Scheduled days for dressing changes to be completed; exception, patient has scheduled wound care visit that day. **Please direct any NON-WOUND related issues/requests for orders to patient's Primary Care Physician. **If current dressing causes regression in wound condition, may D/C ordered dressing product/s and apply Normal Saline Moist Dressing daily until next Wound Healing Center or Other MD appointment. **Notify  Wound Healing Center of regression in wound condition at [phone]. Bathing/ Shower/ Hygiene: May shower with wound dressing protected with water repellent cover or cast protector. No tub bath. Anesthetic (Use 'Patient Medications' Section for Anesthetic Order Entry): Lidocaine applied to wound bed WOUND #22: - Lower Leg Wound Laterality: Left Cleanser: Soap and Water 3 x Per Week/30 Days Discharge Instructions: Gently cleanse wound with antibacterial soap, rinse and pat dry prior to dressing wounds Cleanser: Wound Cleanser 3 x Per Week/30 Days Discharge Instructions: Wash your hands with soap and water. Remove old dressing, discard into plastic bag and place into trash. Cleanse the wound with Wound Cleanser prior to applying [name] clean dressing using gauze sponges, not tissues or cotton balls. Do not scrub or use excessive force. Pat dry using gauze sponges, not tissue or cotton balls. Prim Dressing: Hydrofera Blue Ready Transfer Foam, 2.5x2.5 (in/in) 3 x Per Week/30 Days ary Discharge Instructions: Apply Hydrofera Blue Ready to wound bed as directed Secondary Dressing: Zetuvit Plus 4x4 (in/in) 3 x Per Week/30 Days Secured With: [employer] Food Dressing, Latex-free, Size 5, Small-Head / Shoulder / Thigh 3 x Per Week/30 Days Discharge Instructions: over wrap for comfort Com pression Wrap: Urgo K2, two layer compression system, large 3 x Per Week/30 Days 1. No change in the primary dressings. This will continue until this closes. 2. He has compression pumps he is using it once [name] day. He works nights he finds it cumbersome to do this more frequently #3 will need to discuss what stockings he has as this gets closer to closing. Electronic Signature(s) Signed: 06/29/2023 4:47:46 PM By: [name] Najjar MD Entered By: [name] on 06/29/2023 05:45:24 -------------------------------------------------------------------------------- SuperBill Details Patient Name: Date of Service: [name] LT, CO SLO  [name] 06/29/2023 Medical Record Number: [id number] Patient Account Number: [id number] Date of Birth/Sex: Treating RN: 01/04/1971 (52 y.o. [name]) [name] Primary Care Provider: Maudie [name] Other Clinician: Referring Provider: Treating Provider/Extender: [name], [name] in Treatment: 19 Diagnosis Coding ICD-10 Codes Code Description [phone] Non-pressure chronic ulcer of other part of left lower leg with fat layer exposed I87.312 Chronic venous hypertension (idiopathic) with ulcer of left lower extremity I89.0 Lymphedema, not elsewhere classified I87.321 Chronic venous hypertension (idiopathic) with inflammation of right lower extremity E11.622 Type 2 diabetes mellitus with other skin ulcer Facility Procedures Physician Procedures : CPT4 Code Description Modifier [id number] - WC PHYS LEVEL 3 - EST PT ICD-10 Diagnosis Description L97.822 Non-pressure  [name], [name] ([id number]) [id number] Page 1 of 11 Visit Report for 06/29/2023 HPI Details Patient Name: Date of Service: [name] LT, [name] 06/29/2023 8:00 [name] Medical Record Number: [id number] Patient Account Number: [id number] Date of Birth/Sex: Treating RN: 1971/08/06 (52 y.o. [name]) [name] Primary Care Provider: Maudie [name] Other Clinician: Referring Provider: Treating Provider/Extender: [name] in Treatment: 19 History of Present Illness HPI Description: 12/28/17; this is [name] now 52 year old man who is [name] type II diabetic. He was hospitalized from 10/01/17 through 10/19/17. He had an MSSA soft tissue and skin infection. 2 open areas on the left leg were identified he has [name] smaller area on the left medial calf superiorly just below the knee and [name] wound just above the left ankle on the posterior medial aspect. I think both of these were surgical IandD sites when he was in the hospital. He was discharged with [name] wound VAC at that point however this is since been taken off. He follows with Dr. Sampson Goon for the [facility] and he is still on chronic Keflex at 500 twice [name] day. At that time he was hospitalized his hemoglobin A1c was 15.1 however if I'm reading his endocrinologist notes correctly that is improved. He has been following with Dr. Lorretta Harp at vein and vascular and he has been applying calcium alginate and Unna boots. He has home health changing the dressing. They have also been attempting to get him external compression pumps although the patient is unaware whether they've been approved by insurance at this point. as mentioned he has [name] smaller clean wound on the right lateral calf just below the knee and he has [name] much larger area just above the left ankle medially and posteriorly. Our intake nurse reported greenish purulent looking drainage.the patient did have surgical material sent to pathology in February. This  showed chronic abscess The patient also has lymphedema stage III in the left greater than right lower extremities. He has [name] history of blisters with wounds but these of all were always healed. The patient thinks that the lymphedema may have been present since he was about 52 years old i.e. about 30 years. He does not have graded pressure stockings and has not worn stockings. He does not have [name] distant history of DVT PE or phlebitis. He has not been systemically unwell fever no chills. He states that his Lasix is recently been reduced. He tells me his kidney function is at "30%" and he has been followed by Dr. Thedore Mins of nephrology. At one point he was on Lasix 80 twice [name] day however that's been cut back and he is now on Lasix at 20 twice [name] day. The patient has [name] history of PAD listed in his records although he comes from Dr. Lorretta Harp I don't think is felt to have significant PAD. ABIs in our clinic were noncompressible bilaterally. 01/04/18; patient has [name] large wound on the left lateral lower calf and [name] small wound on the left medial upper calf. He has been to see his nephrologist who changed him to Demadex 40 mg [name] day. I'm hopeful this will help with his systemic fluid overload. He also has stage III lymphedema. Really no change in the 2 wounds since last week 01/11/18; the patient is down 13 pounds. He put his stage III lymphedema left leg in 4K compression last week and there is less edema fluid however we still haven't been able to communicate with home health but  works nights 10/23; Hydrofera Blue under Urgo K2 compression for [name] wound on his left medial lower leg. The wound measures smaller today. Noticeable improvement in the Sperry, Eriberto [name] ([id number]) [id number] Page 4 of 11 edema he tells me he is using his external compression pumps once [name] day at least sometimes twice. He has [name] difficult work schedule and works nights 10/30; we are using Hydrofera Blue under Urgo K2 compression. Wound continues to gradually get smaller. He is using his compression pumps at home once [name] day Electronic Signature(s) Signed: 06/29/2023 4:47:46 PM By: [name] Najjar MD Entered By: [name] on 06/29/2023 05:42:27 -------------------------------------------------------------------------------- Physical Exam Details Patient Name: Date of Service: [name] LT, [name] 06/29/2023 8:00 [name] Medical Record Number: [id number] Patient Account Number: [id number] Date of Birth/Sex: Treating RN: 01-29-1971 (52 y.o. [name] Primary Care Provider: Maudie [name] Other Clinician: Referring Provider: Treating Provider/Extender: [name] in Treatment: 19 Constitutional Sitting or standing Blood Pressure is within target range for patient.. Pulse regular and within target range for patient.[name] Respirations regular, non-labored and within target range.. Temperature is normal and within the target range for the patient.[name] appears in no distress. Notes Wound exam; circular wound in the left mid lower leg. Edema control modest. No surrounding infection. Surrounding tissue however is mostly scar tissue this is contributing to difficulty in closing this wound probably Electronic Signature(s) Signed: 06/29/2023 4:47:46 PM By: [name] Najjar MD Entered By: [name] on 06/29/2023  05:43:35 -------------------------------------------------------------------------------- Physician Orders Details Patient Name: Date of Service: [name] LT, [name] 06/29/2023 8:00 [name] Medical Record Number: [id number] Patient Account Number: [id number] Date of Birth/Sex: Treating RN: 02/08/1971 (52 y.o. [name] Primary Care Provider: Maudie [name] Other Clinician: Referring Provider: Treating Provider/Extender: [name] in Treatment: 19 The following information was scribed by: [name] The information was scribed for: [name] Verbal / Phone Orders: No Diagnosis Coding Follow-up Appointments Return Appointment in 1 week. MARTAVION, BEECK [name] ([id number]) [id number] Page 5 of 11 Home Health Home Health Company: - [facility] for wound care. May utilize formulary equivalent dressing for wound treatment orders unless otherwise specified. Home Health Nurse may visit PRN to address patients wound care needs. [name] [phone] Change on Monday and Friday. Wound Care Center will change on Wednesday Scheduled days for dressing changes to be completed; exception, patient has scheduled wound care visit that day. **Please direct any NON-WOUND related issues/requests for orders to patient's Primary Care Physician. **If current dressing causes regression in wound condition, may D/C ordered dressing product/s and apply Normal Saline Moist Dressing daily until next Wound Healing Center or Other MD appointment. **Notify Wound Healing Center of regression in wound condition at [phone]. Bathing/ Shower/ Hygiene May shower with wound dressing protected with water repellent cover or cast protector. No tub bath. Anesthetic (Use 'Patient Medications' Section for Anesthetic Order Entry) Lidocaine applied to wound bed Wound Treatment Wound #22 - Lower Leg Wound Laterality: Left Cleanser: Soap and  Water 3 x Per Week/30 Days Discharge Instructions: Gently cleanse wound with antibacterial soap, rinse and pat dry prior to dressing wounds Cleanser: Wound Cleanser 3 x Per Week/30 Days Discharge Instructions: Wash your hands with soap and water. Remove old dressing, discard into plastic bag and place into trash. Cleanse the wound with Wound Cleanser prior to applying [name] clean dressing using gauze sponges, not tissues or cotton  works nights 10/23; Hydrofera Blue under Urgo K2 compression for [name] wound on his left medial lower leg. The wound measures smaller today. Noticeable improvement in the Sperry, Eriberto [name] ([id number]) [id number] Page 4 of 11 edema he tells me he is using his external compression pumps once [name] day at least sometimes twice. He has [name] difficult work schedule and works nights 10/30; we are using Hydrofera Blue under Urgo K2 compression. Wound continues to gradually get smaller. He is using his compression pumps at home once [name] day Electronic Signature(s) Signed: 06/29/2023 4:47:46 PM By: [name] Najjar MD Entered By: [name] on 06/29/2023 05:42:27 -------------------------------------------------------------------------------- Physical Exam Details Patient Name: Date of Service: [name] LT, [name] 06/29/2023 8:00 [name] Medical Record Number: [id number] Patient Account Number: [id number] Date of Birth/Sex: Treating RN: 01-29-1971 (52 y.o. [name] Primary Care Provider: Maudie [name] Other Clinician: Referring Provider: Treating Provider/Extender: [name] in Treatment: 19 Constitutional Sitting or standing Blood Pressure is within target range for patient.. Pulse regular and within target range for patient.[name] Respirations regular, non-labored and within target range.. Temperature is normal and within the target range for the patient.[name] appears in no distress. Notes Wound exam; circular wound in the left mid lower leg. Edema control modest. No surrounding infection. Surrounding tissue however is mostly scar tissue this is contributing to difficulty in closing this wound probably Electronic Signature(s) Signed: 06/29/2023 4:47:46 PM By: [name] Najjar MD Entered By: [name] on 06/29/2023  05:43:35 -------------------------------------------------------------------------------- Physician Orders Details Patient Name: Date of Service: [name] LT, [name] 06/29/2023 8:00 [name] Medical Record Number: [id number] Patient Account Number: [id number] Date of Birth/Sex: Treating RN: 02/08/1971 (52 y.o. [name] Primary Care Provider: Maudie [name] Other Clinician: Referring Provider: Treating Provider/Extender: [name] in Treatment: 19 The following information was scribed by: [name] The information was scribed for: [name] Verbal / Phone Orders: No Diagnosis Coding Follow-up Appointments Return Appointment in 1 week. MARTAVION, BEECK [name] ([id number]) [id number] Page 5 of 11 Home Health Home Health Company: - [facility] for wound care. May utilize formulary equivalent dressing for wound treatment orders unless otherwise specified. Home Health Nurse may visit PRN to address patients wound care needs. [name] [phone] Change on Monday and Friday. Wound Care Center will change on Wednesday Scheduled days for dressing changes to be completed; exception, patient has scheduled wound care visit that day. **Please direct any NON-WOUND related issues/requests for orders to patient's Primary Care Physician. **If current dressing causes regression in wound condition, may D/C ordered dressing product/s and apply Normal Saline Moist Dressing daily until next Wound Healing Center or Other MD appointment. **Notify Wound Healing Center of regression in wound condition at [phone]. Bathing/ Shower/ Hygiene May shower with wound dressing protected with water repellent cover or cast protector. No tub bath. Anesthetic (Use 'Patient Medications' Section for Anesthetic Order Entry) Lidocaine applied to wound bed Wound Treatment Wound #22 - Lower Leg Wound Laterality: Left Cleanser: Soap and  Water 3 x Per Week/30 Days Discharge Instructions: Gently cleanse wound with antibacterial soap, rinse and pat dry prior to dressing wounds Cleanser: Wound Cleanser 3 x Per Week/30 Days Discharge Instructions: Wash your hands with soap and water. Remove old dressing, discard into plastic bag and place into trash. Cleanse the wound with Wound Cleanser prior to applying [name] clean dressing using gauze sponges, not tissues or cotton

## 2023-06-30 NOTE — Progress Notes (Signed)
Date of Service: [name], [name] 06/29/2023 8:00 [name] Medical Record Number: [id number] Patient Account Number: [id number] Date of Birth/Sex: Treating RN: 1971/07/08 (52 y.o. [name]) [name] Primary Care [name]: [name] Other Clinician: Referring [name]: Treating [name]/Extender: [name], [name] in Treatment: 19 Wound Status Wound Number: 22 Primary Etiology: Diabetic  Wound/Ulcer of the Lower Extremity Wound Location: Left Lower Leg Wound Status: Open Wounding Event: Gradually Appeared Comorbid History: Hypertension, Type II Diabetes, Neuropathy Date Acquired: 08/30/2022 [name], [name] ([id number]) [id number] Page 8 of 9 Weeks Of Treatment: 19 Clustered Wound: No Photos Wound Measurements Length: (cm) 0.7 Width: (cm) 0.7 Depth: (cm) 0.1 Area: (cm) 0.385 Volume: (cm) 0.038 % Reduction in Area: 99.6% % Reduction in Volume: 99.9% Epithelialization: None Tunneling: No Undermining: No Wound Description Classification: Grade 2 Exudate Amount: Medium Exudate Type: Serosanguineous Exudate Color: red, brown Foul Odor After Cleansing: No Slough/Fibrino Yes Wound Bed Granulation Amount: Large (67-100%) Exposed Structure Granulation Quality: Red Fascia Exposed: No Necrotic Amount: Small (1-33%) Fat Layer (Subcutaneous Tissue) Exposed: Yes Necrotic Quality: Adherent Slough Tendon Exposed: No Muscle Exposed: No Joint Exposed: No Bone Exposed: No Treatment Notes Wound #22 (Lower Leg) Wound Laterality: Left Cleanser Soap and Water Discharge Instruction: Gently cleanse wound with antibacterial soap, rinse and pat dry prior to dressing wounds Wound Cleanser Discharge Instruction: Wash your hands with soap and water. Remove old dressing, discard into plastic bag and place into trash. Cleanse the wound with Wound Cleanser prior to applying [name] clean dressing using gauze sponges, not tissues or cotton balls. Do not scrub or use excessive force. Pat dry using gauze sponges, not tissue or cotton balls. Peri-Wound Care Topical Primary Dressing Hydrofera Blue Ready Transfer Foam, 2.5x2.5 (in/in) Discharge Instruction: Apply Hydrofera Blue Ready to wound bed as directed Secondary Dressing Zetuvit Plus 4x4 (in/in) Secured With [employer] Dressing, Latex-free, Size 5, Small-Head / Shoulder / Thigh Discharge Instruction: over  wrap for comfort Compression Wrap Urgo K2, two layer compression system, large Compression Stockings Add-Ons Electronic Signature(s) Signed: 06/30/2023 3:34:18 PM By: [name] Pax RN [name], [name] ([id number]) [id number] Page 9 of 9 Entered By: [name] on 06/29/2023 05:14:58 -------------------------------------------------------------------------------- Vitals Details Patient Name: Date of Service: [name], [name] 06/29/2023 8:00 [name] Medical Record Number: [id number] Patient Account Number: [id number] Date of Birth/Sex: Treating RN: 02-01-1971 (52 y.o. [name]) [name] Primary Care [name]: [name] Other Clinician: Referring [name]: Treating [name]/Extender: [name], [name] in Treatment: 19 Vital Signs Time Taken: 08:06 Temperature (F): 97.9 Height (in): 76 Pulse (bpm): 79 Weight (lbs): 400 Respiratory Rate (breaths/min): 18 Body Mass Index (BMI): 48.7 Blood Pressure (mmHg): 156/84 Reference Range: 80 - 120 mg / dl Electronic Signature(s) Signed: 06/30/2023 3:34:18 PM By: [name] Pax RN Entered By: [name] on 06/29/2023 05:07:03  Date of Service: [name], [name] 06/29/2023 8:00 [name] Medical Record Number: [id number] Patient Account Number: [id number] Date of Birth/Sex: Treating RN: 1971/07/08 (52 y.o. [name]) [name] Primary Care [name]: [name] Other Clinician: Referring [name]: Treating [name]/Extender: [name], [name] in Treatment: 19 Wound Status Wound Number: 22 Primary Etiology: Diabetic  Wound/Ulcer of the Lower Extremity Wound Location: Left Lower Leg Wound Status: Open Wounding Event: Gradually Appeared Comorbid History: Hypertension, Type II Diabetes, Neuropathy Date Acquired: 08/30/2022 [name], [name] ([id number]) [id number] Page 8 of 9 Weeks Of Treatment: 19 Clustered Wound: No Photos Wound Measurements Length: (cm) 0.7 Width: (cm) 0.7 Depth: (cm) 0.1 Area: (cm) 0.385 Volume: (cm) 0.038 % Reduction in Area: 99.6% % Reduction in Volume: 99.9% Epithelialization: None Tunneling: No Undermining: No Wound Description Classification: Grade 2 Exudate Amount: Medium Exudate Type: Serosanguineous Exudate Color: red, brown Foul Odor After Cleansing: No Slough/Fibrino Yes Wound Bed Granulation Amount: Large (67-100%) Exposed Structure Granulation Quality: Red Fascia Exposed: No Necrotic Amount: Small (1-33%) Fat Layer (Subcutaneous Tissue) Exposed: Yes Necrotic Quality: Adherent Slough Tendon Exposed: No Muscle Exposed: No Joint Exposed: No Bone Exposed: No Treatment Notes Wound #22 (Lower Leg) Wound Laterality: Left Cleanser Soap and Water Discharge Instruction: Gently cleanse wound with antibacterial soap, rinse and pat dry prior to dressing wounds Wound Cleanser Discharge Instruction: Wash your hands with soap and water. Remove old dressing, discard into plastic bag and place into trash. Cleanse the wound with Wound Cleanser prior to applying [name] clean dressing using gauze sponges, not tissues or cotton balls. Do not scrub or use excessive force. Pat dry using gauze sponges, not tissue or cotton balls. Peri-Wound Care Topical Primary Dressing Hydrofera Blue Ready Transfer Foam, 2.5x2.5 (in/in) Discharge Instruction: Apply Hydrofera Blue Ready to wound bed as directed Secondary Dressing Zetuvit Plus 4x4 (in/in) Secured With [employer] Dressing, Latex-free, Size 5, Small-Head / Shoulder / Thigh Discharge Instruction: over  wrap for comfort Compression Wrap Urgo K2, two layer compression system, large Compression Stockings Add-Ons Electronic Signature(s) Signed: 06/30/2023 3:34:18 PM By: [name] Pax RN [name], [name] ([id number]) [id number] Page 9 of 9 Entered By: [name] on 06/29/2023 05:14:58 -------------------------------------------------------------------------------- Vitals Details Patient Name: Date of Service: [name], [name] 06/29/2023 8:00 [name] Medical Record Number: [id number] Patient Account Number: [id number] Date of Birth/Sex: Treating RN: 02-01-1971 (52 y.o. [name]) [name] Primary Care [name]: [name] Other Clinician: Referring [name]: Treating [name]/Extender: [name], [name] in Treatment: 19 Vital Signs Time Taken: 08:06 Temperature (F): 97.9 Height (in): 76 Pulse (bpm): 79 Weight (lbs): 400 Respiratory Rate (breaths/min): 18 Body Mass Index (BMI): 48.7 Blood Pressure (mmHg): 156/84 Reference Range: 80 - 120 mg / dl Electronic Signature(s) Signed: 06/30/2023 3:34:18 PM By: [name] Pax RN Entered By: [name] on 06/29/2023 05:07:03  [name], [name] [id number]) [id number] Page 1 of 9 Visit Report for 06/29/2023 Arrival Information Details Patient Name: Date of Service: [name], [name] 06/29/2023 8:00 [name] Medical Record Number: [id number] Patient Account Number: [id number] Date of Birth/Sex: Treating RN: 04-Aug-1971 (52 y.o. [name]) [name] Primary Care Jadaya Sommerfield: [name] Other Clinician: Referring Kelley Polinsky: Treating Iyannah Blake/Extender: [name], [name] in Treatment: 19 Visit Information History Since Last Visit Added or deleted any medications: No Patient Arrived: Ambulatory Any new allergies or adverse reactions: No Arrival Time: 08:06 Had [name] fall or experienced change in No Accompanied By: self activities of daily living that may affect Transfer Assistance: None risk of falls: Patient Identification Verified: Yes Signs or symptoms of abuse/neglect since last visito No Secondary Verification Process Completed: Yes Hospitalized since last visit: No Patient Requires Transmission-Based Precautions: No Implantable device outside of the clinic excluding No Patient Has Alerts: Yes cellular tissue based products placed in the center Patient Alerts: Patient on Blood Thinner since last visit: ABI R [place] TBI .61 10/11/22 Has Dressing in Place as Prescribed: Yes ABI L [place] TBI .33 10/11/22 Has Compression in Place as Prescribed: Yes Pain Present Now: No Electronic Signature(s) Signed: 06/30/2023 3:34:18 PM By: [name] Pax RN Entered By: [name] on 06/29/2023 05:06:38 -------------------------------------------------------------------------------- Clinic Level of Care Assessment Details Patient Name: Date of Service: [name], [name] 06/29/2023 8:00 [name] Medical Record Number: [id number] Patient Account Number: [id number] Date of Birth/Sex: Treating RN: [birth year] (52 y.o. [name]) [name] Primary Care Scout Gumbs: [name] Other  Clinician: Referring Anaid Haney: Treating Skylynne Schlechter/Extender: [name], [name] in Treatment: 19 Clinic Level of Care Assessment Items TOOL 1 Quantity Score []  - 0 Use when EandM and Procedure is performed on INITIAL visit ASSESSMENTS - Nursing Assessment / Reassessment []  - 0 General Physical Exam (combine w/ comprehensive assessment (listed just below) when performed on new pt. evals) []  - 0 Comprehensive Assessment (HX, ROS, Risk Assessments, Wounds Hx, etc.) [name], [name] ([id number]) [id number] Page 2 of 9 ASSESSMENTS - Wound and Skin Assessment / Reassessment []  - 0 Dermatologic / Skin Assessment (not related to wound area) ASSESSMENTS - Ostomy and/or Continence Assessment and Care []  - 0 Incontinence Assessment and Management []  - 0 Ostomy Care Assessment and Management (repouching, etc.) PROCESS - Coordination of Care []  - 0 Simple Patient / Family Education for ongoing care []  - 0 Complex (extensive) Patient / Family Education for ongoing care []  - 0 Staff obtains Chiropractor, Records, T Results / Process Orders est []  - 0 Staff telephones HHA, Nursing Homes / Clarify orders / etc []  - 0 Routine Transfer to another Facility (non-emergent condition) []  - 0 Routine Hospital Admission (non-emergent condition) []  - 0 New Admissions / Manufacturing engineer / Ordering NPWT Apligraf, etc. , []  - 0 Emergency Hospital Admission (emergent condition) PROCESS - Special Needs []  - 0 Pediatric / Minor Patient Management []  - 0 Isolation Patient Management []  - 0 Hearing / Language / Visual special needs []  - 0 Assessment of Community assistance (transportation, D/C planning, etc.) []  - 0 Additional assistance / Altered mentation []  - 0 Support Surface(s) Assessment (bed, cushion, seat, etc.) INTERVENTIONS - Miscellaneous []  - 0 External ear exam []  - 0 Patient Transfer (multiple staff / Nurse, adult / Similar  devices) []  - 0 Simple Staple / Suture removal (25 or less) []  - 0 Complex Staple / Suture removal (26 or  08/30/2022 [name] Date Acquired: 2 [name] Weeks of Treatment: Open [name] Wound Status: No [name] Wound Recurrence: 0.7x0.7x0.1 [name] Measurements L x W x D (cm) 0.385 [name] (cm) : rea 0.038 [name] Volume (cm) : 99.60% [name] % Reduction in [name] rea: 99.90% [name] % Reduction in Volume: Grade 2 [name] Classification: Medium [name] Exudate [name] mount: Serosanguineous [name] Exudate Type: red, brown [name] Exudate Color: Large (67-100%) [name] Granulation [name] mount: Red [name] Granulation Quality: Small (1-33%) [name] Necrotic [name] mount: Fat Layer (Subcutaneous Tissue): Yes [name] Exposed Structures: Fascia: No Tendon: No Muscle: No Joint: No Bone: No None [name] Epithelialization: Treatment Notes Electronic Signature(s) Signed: 06/30/2023 3:34:18 PM By: [name] Pax RN Entered By: [name] on 06/29/2023 05:16:31 -------------------------------------------------------------------------------- Multi-Disciplinary Care Plan Details Patient Name: Date of Service: [name], [name] 06/29/2023 8:00 [name] Medical Record Number: [id number] Patient Account Number:  [id number] Date of Birth/Sex: Treating RN: 1971/07/16 (52 y.o. [name] Primary Care Haddy Mullinax: [name] Other Clinician: Referring Porter Moes: Treating Misao Fackrell/Extender: [name], [name] in Treatment: 19 Active Inactive Wound/Skin Impairment Nursing Diagnoses: Knowledge deficit related to ulceration/compromised skin integrity GoalsLACHLANN, [name] ([id number]) [id number] Page 6 of 9 Patient/caregiver will verbalize understanding of skin care regimen Date Initiated: 03/09/2023 Date Inactivated: 04/13/2023 Target Resolution Date: 04/09/2023 Goal Status: Met Ulcer/skin breakdown will have [name] volume reduction of 30% by week 4 Date Initiated: 03/09/2023 Date Inactivated: 04/13/2023 Target Resolution Date: 04/09/2023 Goal Status: Met Ulcer/skin breakdown will have [name] volume reduction of 50% by week 8 Date Initiated: 03/09/2023 Date Inactivated: 05/18/2023 Target Resolution Date: 05/10/2023 Goal Status: Met Ulcer/skin breakdown will have [name] volume reduction of 80% by week 12 Date Initiated: 03/09/2023 Date Inactivated: 06/29/2023 Target Resolution Date: 06/09/2023 Goal Status: Met Ulcer/skin breakdown will heal within 14 weeks Date Initiated: 03/09/2023 Target Resolution Date: 07/10/2023 Goal Status: Active Interventions: Assess patient/caregiver ability to obtain necessary supplies Assess patient/caregiver ability to perform ulcer/skin care regimen upon admission and as needed Assess ulceration(s) every visit Notes: Electronic Signature(s) Signed: 06/30/2023 3:34:18 PM By: [name] Pax RN Entered By: [name] on 06/29/2023 05:16:52 -------------------------------------------------------------------------------- Pain Assessment Details Patient Name: Date of Service: [name], [name] 06/29/2023 8:00 [name] Medical Record Number: [id number] Patient Account Number: [id number] Date of Birth/Sex: Treating RN: July 18, 1971 (52  y.o. [name] Primary Care Wilbern Pennypacker: [name] Other Clinician: Referring Tonnia Bardin: Treating Cambre Matson/Extender: [name], [name] in Treatment: 19 Active Problems Location of Pain Severity and Description of Pain Patient Has Paino No Site Locations Pain Management and Medication Current Pain Management: BANNON, MIDYETTE [name] ([id number]) [id number] Page 7 of 9 Electronic Signature(s) Signed: 06/30/2023 3:34:18 PM By: [name] Pax RN Entered By: [name] on 06/29/2023 05:07:13 -------------------------------------------------------------------------------- Patient/Caregiver Education Details Patient Name: Date of Service: [name], [name] 10/30/2024andnbsp8:00 [name] Medical Record Number: [id number] Patient Account Number: [id number] Date of Birth/Gender: Treating RN: November 08, 1970 (52 y.o. [name] Primary Care Physician: [name] Other Clinician: Referring Physician: Treating Physician/Extender: [name] BSO [name], [name] in Treatment: 19 Education Assessment Education Provided To: Patient Education Topics Provided Wound/Skin Impairment: Handouts: Caring for Your Ulcer Methods: Explain/Verbal Responses: State content correctly Electronic Signature(s) Signed: 06/30/2023 3:34:18 PM By: [name] Pax RN Entered By: [name] on 06/29/2023 05:17:27 -------------------------------------------------------------------------------- Wound Assessment Details Patient Name:

## 2023-07-01 DIAGNOSIS — E1022 Type 1 diabetes mellitus with diabetic chronic kidney disease: Secondary | ICD-10-CM | POA: Diagnosis not present

## 2023-07-01 DIAGNOSIS — L97922 Non-pressure chronic ulcer of unspecified part of left lower leg with fat layer exposed: Secondary | ICD-10-CM | POA: Diagnosis not present

## 2023-07-01 DIAGNOSIS — I872 Venous insufficiency (chronic) (peripheral): Secondary | ICD-10-CM | POA: Diagnosis not present

## 2023-07-01 DIAGNOSIS — N186 End stage renal disease: Secondary | ICD-10-CM | POA: Diagnosis not present

## 2023-07-01 DIAGNOSIS — E1065 Type 1 diabetes mellitus with hyperglycemia: Secondary | ICD-10-CM | POA: Diagnosis not present

## 2023-07-01 DIAGNOSIS — I499 Cardiac arrhythmia, unspecified: Secondary | ICD-10-CM | POA: Diagnosis not present

## 2023-07-01 DIAGNOSIS — I890 Lymphedema, not elsewhere classified: Secondary | ICD-10-CM | POA: Diagnosis not present

## 2023-07-01 DIAGNOSIS — I120 Hypertensive chronic kidney disease with stage 5 chronic kidney disease or end stage renal disease: Secondary | ICD-10-CM | POA: Diagnosis not present

## 2023-07-01 DIAGNOSIS — E1051 Type 1 diabetes mellitus with diabetic peripheral angiopathy without gangrene: Secondary | ICD-10-CM | POA: Diagnosis not present

## 2023-07-02 DIAGNOSIS — N2581 Secondary hyperparathyroidism of renal origin: Secondary | ICD-10-CM | POA: Diagnosis not present

## 2023-07-02 DIAGNOSIS — N186 End stage renal disease: Secondary | ICD-10-CM | POA: Diagnosis not present

## 2023-07-02 DIAGNOSIS — Z992 Dependence on renal dialysis: Secondary | ICD-10-CM | POA: Diagnosis not present

## 2023-07-04 DIAGNOSIS — I890 Lymphedema, not elsewhere classified: Secondary | ICD-10-CM | POA: Diagnosis not present

## 2023-07-04 DIAGNOSIS — I120 Hypertensive chronic kidney disease with stage 5 chronic kidney disease or end stage renal disease: Secondary | ICD-10-CM | POA: Diagnosis not present

## 2023-07-04 DIAGNOSIS — E1051 Type 1 diabetes mellitus with diabetic peripheral angiopathy without gangrene: Secondary | ICD-10-CM | POA: Diagnosis not present

## 2023-07-04 DIAGNOSIS — N186 End stage renal disease: Secondary | ICD-10-CM | POA: Diagnosis not present

## 2023-07-04 DIAGNOSIS — I872 Venous insufficiency (chronic) (peripheral): Secondary | ICD-10-CM | POA: Diagnosis not present

## 2023-07-04 DIAGNOSIS — I499 Cardiac arrhythmia, unspecified: Secondary | ICD-10-CM | POA: Diagnosis not present

## 2023-07-04 DIAGNOSIS — E1022 Type 1 diabetes mellitus with diabetic chronic kidney disease: Secondary | ICD-10-CM | POA: Diagnosis not present

## 2023-07-04 DIAGNOSIS — E1065 Type 1 diabetes mellitus with hyperglycemia: Secondary | ICD-10-CM | POA: Diagnosis not present

## 2023-07-04 DIAGNOSIS — L97922 Non-pressure chronic ulcer of unspecified part of left lower leg with fat layer exposed: Secondary | ICD-10-CM | POA: Diagnosis not present

## 2023-07-04 DIAGNOSIS — E785 Hyperlipidemia, unspecified: Secondary | ICD-10-CM | POA: Diagnosis not present

## 2023-07-05 DIAGNOSIS — N2581 Secondary hyperparathyroidism of renal origin: Secondary | ICD-10-CM | POA: Diagnosis not present

## 2023-07-05 DIAGNOSIS — N186 End stage renal disease: Secondary | ICD-10-CM | POA: Diagnosis not present

## 2023-07-05 DIAGNOSIS — Z992 Dependence on renal dialysis: Secondary | ICD-10-CM | POA: Diagnosis not present

## 2023-07-06 ENCOUNTER — Encounter: Payer: Medicare HMO | Attending: Physician Assistant | Admitting: Physician Assistant

## 2023-07-06 DIAGNOSIS — E1122 Type 2 diabetes mellitus with diabetic chronic kidney disease: Secondary | ICD-10-CM | POA: Insufficient documentation

## 2023-07-06 DIAGNOSIS — E11622 Type 2 diabetes mellitus with other skin ulcer: Secondary | ICD-10-CM | POA: Diagnosis not present

## 2023-07-06 DIAGNOSIS — I890 Lymphedema, not elsewhere classified: Secondary | ICD-10-CM | POA: Diagnosis not present

## 2023-07-06 DIAGNOSIS — N186 End stage renal disease: Secondary | ICD-10-CM | POA: Insufficient documentation

## 2023-07-06 DIAGNOSIS — L97822 Non-pressure chronic ulcer of other part of left lower leg with fat layer exposed: Secondary | ICD-10-CM | POA: Diagnosis not present

## 2023-07-06 DIAGNOSIS — I87312 Chronic venous hypertension (idiopathic) with ulcer of left lower extremity: Secondary | ICD-10-CM | POA: Diagnosis not present

## 2023-07-06 DIAGNOSIS — I87321 Chronic venous hypertension (idiopathic) with inflammation of right lower extremity: Secondary | ICD-10-CM | POA: Insufficient documentation

## 2023-07-06 DIAGNOSIS — Z992 Dependence on renal dialysis: Secondary | ICD-10-CM | POA: Diagnosis not present

## 2023-07-06 NOTE — Progress Notes (Addendum)
[name] [name], [name] [name] ([id number]) [id number] Page 1 of 13 Visit Report for 07/06/2023 Chief Complaint Document Details Patient Name: Date of Service: [name], CO [place] W [name] 07/06/2023 8:00 [name] Medical Record Number: [id number] Patient Account Number: [id number] Date of Birth/Sex: Treating RN: 07/21/1971 (52 y.o. [name]) [name] Primary Care Provider: [name] Other Clinician: Referring Provider: Treating Provider/Extender: [name] in Treatment: 20 Information Obtained from: Patient Chief Complaint Left LE Ulcer Electronic Signature(s) Signed: 07/06/2023 8:08:06 AM By: Allen Derry PA-C Entered By: Allen Derry on 07/06/2023 08:08:06 -------------------------------------------------------------------------------- Debridement Details Patient Name: Date of Service: [name], CO SLO W [name] 07/06/2023 8:00 [name] Medical Record Number: [id number] Patient Account Number: [id number] Date of Birth/Sex: Treating RN: Nov 28, 1970 (52 y.o. [name] [name] Primary Care Provider: [name] Other Clinician: Referring Provider: Treating Provider/Extender: [name] in Treatment: 20 Debridement Performed for Assessment: Wound #22 Left Lower Leg Performed By: Physician Allen Derry, PA-C The following information was scribed by: [name] The information was scribed for: Allen Derry Debridement Type: Debridement Severity of Tissue Pre Debridement: Fat layer exposed Level of Consciousness (Pre-procedure): Awake and Alert Pre-procedure Verification/Time Out Yes - 08:54 Taken: Start Time: 08:54 Percent of Wound Bed Debrided: 100% T Area Debrided (cm): otal 0.33 Tissue and other material debrided: Viable, Non-Viable, Slough, Subcutaneous, Skin: Dermis , Skin: Epidermis, Slough Level: Skin/Subcutaneous Tissue Debridement Description: Excisional Instrument: Curette Bleeding: Minimum End Time: 08:57 [name] [name], [name] [name]  ([id number]) [id number] Page 2 of 13 Procedural Pain: 0 Post Procedural Pain: 0 Response to Treatment: Procedure was tolerated well Level of Consciousness (Post- Awake and Alert procedure): Post Debridement Measurements of Total Wound Length: (cm) 0.6 Width: (cm) 0.7 Depth: (cm) 0.1 Volume: (cm) 0.033 Character of Wound/Ulcer Post Debridement: Improved Severity of Tissue Post Debridement: Fat layer exposed Post Procedure Diagnosis Same as Pre-procedure Electronic Signature(s) Signed: 07/06/2023 1:06:42 PM By: [name] RN Signed: 07/07/2023 11:25:01 AM By: Allen Derry PA-C Entered By: [name] on 07/06/2023 13:06:42 -------------------------------------------------------------------------------- HPI Details Patient Name: Date of Service: [name], CO SLO W [name] 07/06/2023 8:00 [name] Medical Record Number: [id number] Patient Account Number: [id number] Date of Birth/Sex: Treating RN: 03/19/1971 (52 y.o. [name] [name] Primary Care Provider: [name] Other Clinician: Referring Provider: Treating Provider/Extender: [name] in Treatment: 20 History of Present Illness HPI Description: 12/28/17; this is [name] now 52 year old man who is [name] type II diabetic. He was hospitalized from 10/01/17 through 10/19/17. He had an MSSA soft tissue and skin infection. 2 open areas on the left leg were identified he has [name] smaller area on the left medial calf superiorly just below the knee and [name] wound just above the left ankle on the posterior medial aspect. I think both of these were surgical IandD sites when he was in the hospital. He was discharged with [name] wound VAC at that point however this is since been taken off. He follows with Dr. Sampson Goon for the [facility] and he is still on chronic Keflex at 500 twice [name] [name] At that time he was hospitalized his hemoglobin A1c was 15.1 however if I'm reading his endocrinologist notes correctly that is improved. He  has been following with Dr. Lorretta Harp at vein and vascular and he has been applying calcium alginate and Unna boots. He has home health changing the dressing. They have also been attempting to get him external compression pumps although the patient is unaware whether they've been approved by insurance at this  point. as mentioned he has [name] smaller clean wound on the right lateral calf just below the knee and he has [name] much larger area just above the left ankle medially and posteriorly. Our intake nurse reported greenish purulent looking drainage.the patient did have surgical material sent to pathology in February. This showed chronic abscess The patient also has lymphedema stage III in the left greater than right lower extremities. He has [name] history of blisters with wounds but these of all were always healed. The patient thinks that the lymphedema may have been present since he was about 52 years old i.e. about 30 years. He does not have graded pressure stockings and has not worn stockings. He does not have [name] distant history of DVT PE or phlebitis. He has not been systemically unwell fever no chills. He states that his Lasix is recently been reduced. He tells me his kidney function is at "30%" and he has been followed by Dr. Thedore Mins of nephrology. At one point he was on Lasix 80 twice [name] [name] however that's been cut back and he is now on Lasix at 20 twice [name] [name] The patient has [name] history of PAD listed in his records although he comes from Dr. Lorretta Harp I don't think is felt to have significant PAD. ABIs in our clinic were noncompressible bilaterally. 01/04/18; patient has [name] large wound on the left lateral lower calf and [name] small wound on the left medial upper calf. He has been to see his nephrologist who changed him to Demadex 40 mg [name] [name] I'm hopeful this will help with his systemic fluid overload. He also has stage III lymphedema. Really no change in the 2 wounds since last week 01/11/18; the patient is down 13  pounds. He put his stage III lymphedema left leg in 4K compression last week and there is less edema fluid however we still haven't been able to communicate with home health but apparently it is [facility] but the dressings have not been changed. The patient noted an odor last week. He is also had compression pumps ordered by [facility] this as [name] not completed the paperwork stage. 01/18/18; patient continues to lose weight. Stage III lymphedema in the left greater than right leg under for alert compression. The major wound is on the left lateral ankle area. He apparently has bilateral compression pumps being brought to his house, these were ordered by [facility] Notable for the fact today he had some blisters on the right anterior leg together with some skin nodules. This is no doubt secondary to severe lymphedema. TAZZ, BURGOS [name] ([id number]) [id number] Page 3 of 13 01/25/18; the patient has obtained his compression pumps and is using them per vein and vascular instructions 3 times [name] [name] for an hour. He also saw [name] of vein and vascular. He was felt to have venous insufficiency but did not suggest any intervention also improved edema. It was suggested that he have compression stockings 20-30 mm on [name] daily basis in addition to compression pumps. The patient arrives in clinic today with [name] layer of unna under for layer compression. he seems to have some trouble with the degree of compression. He has open areas on the left lateral ankle area which is his major wound left upper medial calf and [name] superficial open area on the right anterior shin area which was blistered last week. He has skin changes on the right anterior calf which I think are no doubt secondary to lymphedema skin  nodules etc. 02/01/18; the patient comes in telling us his nephrologist have to his torsemide. Unfortunately today he is put on 7 pounds by our scales. He has blisters all  over the anterior and medial part of his right calf and [name] new open wound. He also has soupy green drainage coming out of the left lateral calf /ankle wound. 02/08/18; culture I did last week of the left lateral ankle wound grew both Pseudomonas and Morganella. He is on Keflex from Dr. Sampson Goon in the hospital. I will need to review these notes.in any case Keflex is not going to cover these 2 organisms. I'm probably going to added ciprofloxacin today for 1 week. [name] lot of drainage that looks purulent last week. He is not complaining of pain however he has managed to put on 10 pounds in 2 weeks by our scales in this clinic. He is going to see his nephrologist tomorrow 02/15/18; he completed the ciprofloxacin I gave him last week. Notable that he is up to 379 pounds today which is up 16 pounds from 2 weeks ago. He is complaining of orthopnea but doesn't have any chest pain. 02/22/18; he continues to have weight gain. R intake nurse reports again purulent green drainage coming out of the lateral wound on the lateral left calf. He has small open area on the right anterior leg. His torsemide was increased to 2 tablets [name] [name] I believe this is 40 mg last week in response to the call admitted to Dr. Doristine Church office. He follows up with Dr. Thedore Mins and Dr. Gilda Crease tomorrow 03/01/18 his weight essentially stable today at 383 pounds. Drainage out of the left lateral wound on the lower left calf/ankle is [name] lot less. Culture last time grew Pseudomonas. I put him on cefdinir. He has been to Dr. Doristine Church office no adjustments in his diuretics. Dr. Dion Saucier prescribed [name] wraparound stocking for the right leg.there is no open area on the right leg. He has [name] superficial area on the left medial calf, left posterior calf and in the large area on the left lateral however this looks better 03/15/18; weight is not up to 393 pounds. He saw his nephrologist yesterday Dr. Thedore Mins will increase the Demadex I'm hopeful this will help with the  edema control. I'm using silver alginate to all his wounds. In particular the left lateral ankle looks better. Unfortunately he has new open areas on the right lateral calf that will include use of his compression stocking at least in the short to medium term. He has new wounds 3 on the right lateral calf. One of these has some size however all numerous superficial 03/22/18; his weight is stabilized [name] bit. Just adjustment of his diuretics by his nephrologist. There is no doubt he has some degree of systemic fluid overload on top of severe left greater than right lymphedema. 2 weeks ago tried to transition him to stockings on the right leg however he developed re-breakdown of skin on the right leg and we had to put him back in compression last week. He also uses external compression pumps and claims to be compliant Continued concern about his depression today 03/29/18; several ongoing issues with this patient; He no longer has home health coverage apparently secondary to [name] lapse in insurance. He is apparently transitioning from short for long-term disability. [facility] was changing his compression wraps on Monday and Friday. He has gained 10 pounds since last week Sees Dr. Lorretta Harp tomorrow Saw his primary doctor last week about  the depression. It sounds as though he declined pharmacologic management. He seems somewhat better today. We were concerned last week when he came. Somewhat better today He is using his compression pumps once [name] [name] at home, I have asked for twice [name] [name] if possible especially on the left leg Follows up with his nephrologist in mid-August. He is managing his diuretic for I think stage IV chronic renal failure Paradoxically his wounds actually look better 04/19/18; the patient has not been seen since I last saw him 3 weeks ago. He saw Dr. Gilda Crease of vascular surgery on 03/30/18 I believe he put him in [name] 20/30 stocking bilaterally with [name] wraparound extremitease stocking. He  has not been putting anything specifically on the wound. More problematic than that he has not been wearing the stockings he is at home. He has been using his external compression pumps once per [name] according to him on [name] rare occasion twice On [name] psychosocial level the patient is now on long-term disability and is applying for COBRA therefore he is between insurances. He has not been able to follow up with Dr. Thedore Mins who is his nephrologist as [name] result. As noted his weight is up to 407 pounds today. He promises me he'll follow-up with Dr. Thedore Mins 05/03/18; he hasn't been here in 2 weeks now. He apparently has been wearing [name] compression sock on the left leg. Massive increase in edema 3 large open wounds on the left anterior leg that were probably blisters. Significant deterioration in the left lateral ankle wound that we've been doing as his most problematic wound. He still does not have his insurance issues wrapped up. His weight is well over 400 pounds. 05/10/18; arrives today with better looking edema control in the left leg. He has been using his compression pumps twice [name] [name] We also wrapped it is left leg for there he's been using his pumps so there is much better edema control. Most of new wounds from last week look [name] lot better. Even the refractory area on the lower left lateral ankle looks [name] lot better to me today. He follows up with Dr. Thedore Mins this morning [nephrology] 05/17/18 patient arrives today with [name] lot less edema in the left leg. His weight is gone down 7 pounds. He tells me he saw Dr. Thedore Mins but his torsemide was not adjusted. He is using the palms 3 times [name] [name] There is been quite an improvement in the remaining wound on the left lateral ankle Weekly visit for follow-up of bilateral lower extremity wounds related to severe lymphedema and probably some degree of systemic fluid overload from chronic renal failure stage IV. His weight is up this week to over 400 pounds. He noted increasing edema  in the right leg late last week. He took his compression stocking off he did not increase the frequency of this compression pump use. He developed [name] large blister on the back of the right calf. He also has [name] new opened blister on the left lateral calf in addition to the wound that we've been using on the distal left lower calf area and we've been using silver alginate. He tells me that he has an ultrasound which is [name] DVT rule out and I think [name] reflux study ordered by Dr. Lorretta Harp 05/31/18; weekly visit. He went to see vein and vascular this week apparently they remove the 4-layer compression on the left and put an Unna boot on him in replacement. He's got more swelling in the left  leg is resolved. That being said his left lower Wound is better. He still has [name] fairly large wound on the right posterior calf this was not disturbed. He has [name] follow-up appointment with Dr. Doristine Church nephrologist next week 06/07/2018; the patient arrives today with [name] history that he took both his compression wraps off 3 days ago in order to take [name] shower. He has bilateral severe tense blisters. [name] lot of weeping erythema especially on the lateral left calf. Almost circumferential blisters on the right. Multiple areas of epithelial breakdown. He does not complain of any pain fever chills. He states he has been using his compression pump in some form of stocking that I could not really determine the type. He did not come into the clinic with anything on his legs. He tells me he has an appointment with Dr. Ella Jubilee his nephrologist I believe this Friday. Paradoxically his weight is actually down to 385 pounds I believe last week he was over 400 06/14/18; arrives with better looking wound surfaces today on both legs. There is no further blistering however there are still areas that aren't epithelialized on the right anterior, right lateral, left posterior and left medial. His original wound just above the left ankle laterally is still open  moist. His weight was about the same today. He tells me that his nephrologist increase the torsemide from 40/20/09/1939/40 twice [name] [name] 06/21/18; both his wraps fell down to his mid calf. Has [name] result he has multiple blisters across the anterior right leg above with the wraps ultimately watched. He also has blisters on the posterior part of the left calf 2. His original wound on the left lateral calf is hard to see any open area. There is however [name] divot. Which is going to be difficult to deal with into the future until the edema in the left leg is controlled. 2 small superficial areas remain He tells [place] his weight was 386 pounds on his scale at home. According to our scale he is up 5 pounds. He is not on [name] fluid restriction 06/28/18; patient's weight is gone up 2 pounds since last time. He has weeping edema and open superficial wounds on the right posterior right lateral and right anterior calf. He has [name] small open area on the left anterior probably left posterior calf and the original wound on the left lateral calf appears to be just about closed He is being planned for [name] dialysis shunt in the right arm through [facility] incoordination with his nephrologist Dr. Thedore Mins He claims to be using his compression pumps twice [name] [name] He has lymphedema and no doubt systemic fluid volume overload from stage IV chronic renal failure 07/04/18; patient's weight is up into the 396 range. He is supposed to see his cardiologist tomorrow and plans are being made for [name] shunt by Dr. Gilda Crease. He still has significant open wounds/draining areas on the right anterior and right posterior calf. Several small open areas on the left which are less in terms of wound Blanton, Reggie [name] ([id number]) [id number] Page 4 of 13 area on the right which is surprising given the fact the left is the larger most lymphedematous leg 07/26/2018 She is seen today for follow-up and management of lateral posterior  lower leg wound and bilateral lymphoedema. He has [name] very flat affect and depressed mood today. Unable to elicit much information from him today. No thoughts of harm to self identified. Recently had [name] follow-up with vascular vein for fistula placement to  initiate dialysis in the right arm. He has 2 dressings on the right arm from the fistula procedure with no bleeding or drainage noted on the dressing. He does have [name] large amount of bruising in the surrounding to puncture areas on the right lower arm from the fistula placement. No pain elicited with palpation of the right arm. He denies [name] diminished sensation in that right lower arm as well. He does have [name] small wound to the left lateral posterior lower leg. No visual drainage present to either leg. However the wound to the left lower leg does have [name] foul odor even after cleaning it. The left is the larger most lymphedematous leg with the right Leg still having [name] significant amount of edema. No drainage or blisters present on either leg today. He states that he is using the lymphedema pumps twice [name] [name] Not too sure if he is compliant with actually using the lymphedema pumps based on the amount of edema that he has in his legs. Weight increased from 396.1 to 401.6. No recent fevers, chills, or shortness of breath. 08/02/18; the patient only has one remaining wound on the left lateral calf which is still open. This is in the resultant [place] shaped area which was once the site of the major wound. He does not have any open areas on the right leg nor additional wounds on the left no blistering. As usual the left leg is much larger than the right. He comes in today stating that he took the wraps off on the right leg on Friday and since then he has presumably been wearing his stocking which is [name] wraparound variant stocking on the right. States he took the 4-layer compression off his left leg this morning to shower. He is seeing vein and vascular tomorrow. He  states he is using his compression pumps once or twice [name] [name] He also admits that he has been on his feet [name] lot 08/09/18; the patient has the left leg healed except for the original wound site on the left medial Just above the ankle. On the right he has [name] new wound on the right lateral calf. He saw vein and vascular last week and he is been back in his pressure stockings and wraparound stockings. He also is been put on metolazone 3 times [name] week by nephrology along with his torsemide. I'm hopeful that this will help with some of the lower extremity edema. I've always felt that he has lymphedema but there may be [name] secondary component contributing 08/16/18; the patient used his own junk slight stockings to both legs last week. He claims he is pumping once to twice [name] [name] at home. Here rise with his legs looking visibly less edematous. His usual left greater than right secondary to lymphedema. He is tolerating the metolazone 3 times [name] week along with the torsemide. His weight was down 2 pounds. He follows up with nephrology soon for follow-up lab work I think in early January. He does not have any open areas on the right leg he still has the crevice on the lateral left calf just above the ankle there is 2 small open areas here I am not sure that this is ever going to be free of an open area although certainly not worsening. More problematic lead he has 2 blistered areas just above the wrist on the left lateral calf 09/06/2018; the patient arrives with much larger legs than I remember seeing [name] month ago especially on the right. He  has 1 open area on the left lateral calf and he had [name] 4 layer compression on this on arrival in the clinic. This was apparently put on by [facility] in response to [name] new open wound. He claims he is pumping once or more often twice [name] [name] although I really have [name] hard time believing it. He came in with nothing but stockings on his legs. There is tremendous increase in the edema  bilaterally 1/22; the patient only has the one area that is not totally closed and that is the divot on the left lateral calf. I do not think we are going to be able to do anything further to this unless he loses edema fluid in his left leg when he starts dialysis which I gather is sometime soon. He is using his own wraparound compression garment. He is using the compression pumps twice [name] [name] 2/19; the patient does not have any open wounds on the right leg but both legs left and the right have extensive edema is usually worse on the left. He has [name] tense blister on the left anterior leg. Using his compression pumps usually once [name] [name] per his description. He does not come in with the compression stockings 2/26. Right leg remains without any open wounds that he has his own stocking. The tense blister on the left anterior leg is closed over. He still has the open area on the left lateral mid calf and then the divot injury area which I do not think is going to heal just above the left ankle. 3/11; the patient arrives with all the wounds on both legs healed. He has his stocking with the wraparound juxta lite on the right. He has 1 for the left. He initiated dialysis on Monday and we will see what effect that has on his lower extremity edema. He has his compression pumps and he plans to use them at least once [name] [name] Readmission 01/20/2022 Mr. Costlow Devers is [name] 52 year old male with [name] past medical history of type 1 diabetes on hemodialysis that presents to the clinic for [name] 1 year history of wound to the right side of his neck. He states that he had [name] central line in place when he started hemodialysis while his graft matured. He states that the area where the line was removed never healed. He reports serosanguineous drainage and odor to the area over the past year. He has not taken antibiotics for this issue. He currently denies systemic signs of infection. He currently keeps the area covered with [name]  Band-Aid. 5/31; patient presents for follow-up. He has been taking doxycycline without issues. He had [name] culture done at last clinic visit that showed abundant Streptococcus anginosus sensitive to penicillin. I will go ahead and switch him to Augmentin. He has been using mupirocin ointment to the wound bed and covering this with Hydrofera Blue. Upon inspection today there is [name] piece of dressing tightly adhered in the wound bed that appears to be connected to underlying structures. He obtains his neck ultrasound tomorrow. He denies systemic signs of infection. 6/14; Patient presents for follow-up. Patient had an ultrasound to his neck that showed [name] prior PermCath still present is [name] retained foreign body. This was causing his wound not to heal. On 6/7 the foreign body was removed by Dr.Schnier. Since then the area has healed up nicely. He has no issues or complaints today. He denies any drainage, open wounds or discomfort to the previous wound site. 02/16/2023 Mr. [name] [name]  [name] [name] is [name] 52 year old male with [name] past medical history of end-stage renal disease on hemodialysis, Type 2 diabetes, And lymphedema/venous insufficiency That presents to the clinic for evaluation of his right lower extremity. He has wounds to his left lower extremity for several months but these are being managed by vein and vascular with [name] wound VAC. On 02/04/2023 patient was evaluated for new blisters to the right lower extremity and potential cellulitis at vein and vascular and was recommended to go to the ED. He was started on oral antibiotics by the ED and referred to our clinic for follow-up to assure that the right leg wounds improved. He had an Unna boot placed by vein and vascular two days prior. He has home health that changes the unna boot and wound vac 2 times weekly. He currently denies signs of infection. There are no open wounds to the right lower extremity. 7/10; Patient was seen last month for left lower extremity wounds  however this was in the care of vein and vascular and I recommended he discuss with his doctor if he would like to transfer care to the wound care center. At this time patient states that he would like to follow here. He has been using [name] wound VAC to the left lower extremity large wound and he has been keeping the area covered to the dorsal left foot wound. He would like to stop the wound VAC as He has had trouble with the seal. He currently denies systemic signs of infection. 7/17; patient presents for follow-up. We have been using Dakin's wet-to-dry dressings under Kerlix/Coban to the left lower extremity large wound and Silver alginate to the left dorsal foot wound. Wounds are smaller. Patient has no issues or complaints. 7/24; patient presents for follow-up. We have been using Dakin's wet-to-dry dressings to the larger wound and silver alginate to the smaller dorsal foot wound to the left lower extremity all under Kerlix/Coban. Wounds are smaller today. Patient has no issues or complaints. Originally patient had [name] wound VAC. He still has this in his possession but we are not using it. I recommended He returned this. He would also like new lymphedema pumps. 7/31; patient presents for follow-up. We have been using Dakin's wet-to-dry dressings under Kerlix/Coban to the left lower extremity larger wound and silver alginate to the smaller dorsal left foot wound. He was noted to have [name] fever during dialysis and was found to be bacteremic. He has been in the hospital for the past few days. He is receiving IV cefazolin during dialysis now. He has no issues or complaints about the wounds. The left dorsal foot wound is healed today. He currently denies systemic signs of infection. 8/7; patient presents for follow-up. We have been using silver alginate under Kerlix/Coban to the left lower extremity. Wound is smaller. 8/14; this patient has [name] reasonably large wound on the left leg. Dimensions are marginally  impaired. He has chronic venous insufficiency and his edema is poorly controlled. We have been using kerlix Coban compression. Patient also has external compression pumps at home but he has not been using them. He also has dialysis Tuesday Thursdays and Saturdays 8/21; patient presents for follow-up. We have been using silver cell under compression therapy. The compression wrap was increased to 3 layer at last clinic visit. He states he tolerated this well. He has no issues or complaints today. Wound is smaller. 8/28; patient presents for follow-up. We have been using silver alginate under compression therapy to the left lower  extremity. Wound appears well-healing. [name] [name], [name] [name] ([id number]) [id number] Page 5 of 13 Unfortunately has developed another wound just lateral to this that looks like it started out as [name] blister. 9/4; patient presents for follow-up. We have been using silver alginate under compression therapy to the left lower extremity. Wound is smaller. New wound at last clinic visit on the lateral aspect has closed. 9/11; patient presents for follow-up. We have been using silver alginate under compression therapy to the left lower extremity. Wound continues to become smaller. 9/18; patient with longstanding chronic lymphedema. Wound on the left medial lower leg. We have been using silver alginate under Urgo K2 lite. The wound is improved in terms of appearance and measurements per our intake nurse 9/25; chronic longstanding lymphedema. Wound on the left medial lower leg. Measurements are better this week. Last week I think I changed him to [facility] still under and Urgo K2 lite 10/2; chronic longstanding lymphedema with [name] weak wound on the left anterior medial lower leg. Not much change in dimension this week slightly hyper granulated. I thought I had changed him to [facility] however according to our intake nurse he has been receiving silver  alginate. We are using an Urgo K2 light compression because TBI's on the left were only 0.33 is ABIs were noncompressible. He follows with vein and vascular for his arterial insufficiency 10/9; wound is measuring smaller on the left anterior lower leg. We have been using Hydrofera Blue under Urgo K2 compression. He tells me that he is getting/has received new compression pumps I have asked him to use it 1 hour twice [name] [name] even over our compression wraps. 10/16; we have been using Hydrofera Blue under Urgo K2 compression. Wound is measuring smaller and looks reasonably healthy. Unfortunately he did not get [name] chance to consistently use the compression pumps twice [name] [name] 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 Furthermore he works nights 10/23; Hydrofera Blue under Urgo K2 compression for [name] wound on his left medial lower leg. The wound measures smaller today. Noticeable improvement in the edema he tells me he is using his external compression pumps once [name] [name] at least sometimes twice. He has [name] difficult work schedule and works nights 10/30; we are using Hydrofera Blue under Urgo K2 compression. Wound continues to gradually get smaller. He is using his compression pumps at home once [name] [name] 07-06-2023 upon evaluation today patient appears to be doing well currently in regard to his wound. This is actually showing signs of excellent epithelization and granulation at this point. Fortunately I do not see any evidence of worsening overall and I do believe that the patient is making good headway here towards closure. Electronic Signature(s) Signed: 07/06/2023 10:04:32 AM By: Allen Derry PA-C Entered By: Allen Derry on 07/06/2023 10:04:32 -------------------------------------------------------------------------------- Physical Exam Details Patient Name: Date of Service: [name], CO SLO W [name] 07/06/2023 8:00 [name] Medical Record Number: [id number] Patient Account Number:  [id number] Date of Birth/Sex: Treating RN: Mar 08, 1971 (52 y.o. [name] [name] Primary Care Provider: [name] Other Clinician: Referring Provider: Treating Provider/Extender: [name] in Treatment: 20 Constitutional Well-nourished and well-hydrated in no acute distress. Respiratory normal breathing without difficulty. Psychiatric this patient is able to make decisions and demonstrates good insight into disease process. Alert and Oriented x 3. pleasant and cooperative. Notes Upon inspection patient's wound bed actually showed signs of good granulation and epithelization at this point.  Fortunately I do not see any signs of worsening overall and I do believe that the patient is making really good headway here towards closure which is good news there was some mild debridement to perform which I did today and postdebridement wound bed actually appears to be doing much better which is great news. Electronic Signature(s) Signed: 07/06/2023 10:04:57 AM By: Allen Derry PA-C Entered By: Allen Derry on 07/06/2023 10:04:57 Stricker, Sevan [name] ([id number]) [id number] Page 6 of 13 -------------------------------------------------------------------------------- Physician Orders Details Patient Name: Date of Service: [name], CO SLO W [name] 07/06/2023 8:00 [name] Medical Record Number: [id number] Patient Account Number: [id number] Date of Birth/Sex: Treating RN: [birth year] (52 y.o. [name]) [name] Primary Care Provider: [name] Other Clinician: Referring Provider: Treating Provider/Extender: [name] in Treatment: 20 The following information was scribed by: [name] The information was scribed for: Allen Derry Verbal / Phone Orders: No Diagnosis Coding ICD-10 Coding Code Description [phone] Non-pressure chronic ulcer of other part of left lower leg with fat layer exposed I87.312 Chronic venous hypertension  (idiopathic) with ulcer of left lower extremity I89.0 Lymphedema, not elsewhere classified I87.321 Chronic venous hypertension (idiopathic) with inflammation of right lower extremity E11.622 Type 2 diabetes mellitus with other skin ulcer Follow-up Appointments Return Appointment in 1 week. Home Health Home Health Company: - [facility] for wound care. May utilize formulary equivalent dressing for wound treatment orders unless otherwise specified. Home Health Nurse may visit PRN to address patients wound care needs. [name] [phone] Change on Monday and Friday. Wound Care Center will change on Wednesday Scheduled days for dressing changes to be completed; exception, patient has scheduled wound care visit that [name] **Please direct any NON-WOUND related issues/requests for orders to patient's Primary Care Physician. **If current dressing causes regression in wound condition, may D/C ordered dressing product/s and apply Normal Saline Moist Dressing daily until next Wound Healing Center or Other MD appointment. **Notify Wound Healing Center of regression in wound condition at [phone]. Bathing/ Shower/ Hygiene May shower with wound dressing protected with water repellent cover or cast protector. No tub bath. Anesthetic (Use 'Patient Medications' Section for Anesthetic Order Entry) Lidocaine applied to wound bed Wound Treatment Wound #22 - Lower Leg Wound Laterality: Left Cleanser: Soap and Water 3 x Per Week/30 Days Discharge Instructions: Gently cleanse wound with antibacterial soap, rinse and pat dry prior to dressing wounds Cleanser: Wound Cleanser 3 x Per Week/30 Days Discharge Instructions: Wash your hands with soap and water. Remove old dressing, discard into plastic bag and place into trash. Cleanse the wound with Wound Cleanser prior to applying [name] clean dressing using gauze sponges, not tissues or cotton balls. Do not scrub or use excessive force. Pat dry using  gauze sponges, not tissue or cotton balls. Prim Dressing: Hydrofera Blue Ready Transfer Foam, 2.5x2.5 (in/in) 3 x Per Week/30 Days ary Discharge Instructions: Apply Hydrofera Blue Ready to wound bed as directed Secondary Dressing: Zetuvit Plus 4x4 (in/in) 3 x Per Week/30 Days Secured With: [employer] Dressing, Latex-free, Size 5, Small-Head / Shoulder / Thigh 3 x Per Week/30 Days Discharge Instructions: over wrap for comfort Compression Wrap: Urgo K2, two layer compression system, large 3 x Per Week/30 Days Goguen, Ellery [name] ([id number]) [id number] Page 7 of 13 Electronic Signature(s) Signed: 07/07/2023 11:25:01 AM By: Allen Derry PA-C Signed: 07/13/2023 4:43:41 PM By: [name] RN Entered By: [name] on 07/06/2023 13:06:20 -------------------------------------------------------------------------------- Problem List Details Patient Name: Date of Service: [name], CO  SLO W [name] 07/06/2023 8:00 [name] Medical Record Number: [id number] Patient Account Number: [id number] Date of Birth/Sex: Treating RN: [birth year] (52 y.o. [name]) [name] Primary Care Provider: [name] Other Clinician: Referring Provider: Treating Provider/Extender: [name] in Treatment: 20 Active Problems ICD-10 Encounter Code Description Active Date MDM Diagnosis [phone] Non-pressure chronic ulcer of other part of left lower leg with fat layer exposed6/19/2024 No Yes I87.312 Chronic venous hypertension (idiopathic) with ulcer of left lower extremity 02/16/2023 No Yes I89.0 Lymphedema, not elsewhere classified 03/23/2023 No Yes I87.321 Chronic venous hypertension (idiopathic) with inflammation of right lower 02/16/2023 No Yes extremity E11.622 Type 2 diabetes mellitus with other skin ulcer 02/16/2023 No Yes Inactive Problems ICD-10 Code Description Active Date Inactive Date L97.522 Non-pressure chronic ulcer of other part of left foot with fat layer exposed  03/16/2023 03/16/2023 Resolved Problems Electronic Signature(s) Signed: 07/06/2023 8:07:36 AM By: Allen Derry PA-C Entered By: Allen Derry on 07/06/2023 08:07:36 Early, Sheridan [name] ([id number]) [id number] Page 8 of 13 -------------------------------------------------------------------------------- Progress Note Details Patient Name: Date of Service: [name], CO SLO W [name] 07/06/2023 8:00 [name] Medical Record Number: [id number] Patient Account Number: [id number] Date of Birth/Sex: Treating RN: 29-May-1971 (52 y.o. [name] [name] Primary Care Provider: [name] Other Clinician: Referring Provider: Treating Provider/Extender: [name] in Treatment: 20 Subjective Chief Complaint Information obtained from Patient Left LE Ulcer History of Present Illness (HPI) 12/28/17; this is [name] now 52 year old man who is [name] type II diabetic. He was hospitalized from 10/01/17 through 10/19/17. He had an MSSA soft tissue and skin infection. 2 open areas on the left leg were identified he has [name] smaller area on the left medial calf superiorly just below the knee and [name] wound just above the left ankle on the posterior medial aspect. I think both of these were surgical IandD sites when he was in the hospital. He was discharged with [name] wound VAC at that point however this is since been taken off. He follows with Dr. Sampson Goon for the [facility] and he is still on chronic Keflex at 500 twice [name] [name] At that time he was hospitalized his hemoglobin A1c was 15.1 however if I'm reading his endocrinologist notes correctly that is improved. He has been following with Dr. Lorretta Harp at vein and vascular and he has been applying calcium alginate and Unna boots. He has home health changing the dressing. They have also been attempting to get him external compression pumps although the patient is unaware whether they've been approved by insurance at this point. as mentioned he has [name] smaller clean  wound on the right lateral calf just below the knee and he has [name] much larger area just above the left ankle medially and posteriorly. Our intake nurse reported greenish purulent looking drainage.the patient did have surgical material sent to pathology in February. This showed chronic abscess The patient also has lymphedema stage III in the left greater than right lower extremities. He has [name] history of blisters with wounds but these of all were always healed. The patient thinks that the lymphedema may have been present since he was about 52 years old i.e. about 30 years. He does not have graded pressure stockings and has not worn stockings. He does not have [name] distant history of DVT PE or phlebitis. He has not been systemically unwell fever no chills. He states that his Lasix is recently been reduced. He tells me his kidney function is at "30%" and he has been  followed by Dr. Thedore Mins of nephrology. At one point he was on Lasix 80 twice [name] [name] however that's been cut back and he is now on Lasix at 20 twice [name] [name] The patient has [name] history of PAD listed in his records although he comes from Dr. Lorretta Harp I don't think is felt to have significant PAD. ABIs in our clinic were noncompressible bilaterally. 01/04/18; patient has [name] large wound on the left lateral lower calf and [name] small wound on the left medial upper calf. He has been to see his nephrologist who changed him to Demadex 40 mg [name] [name] I'm hopeful this will help with his systemic fluid overload. He also has stage III lymphedema. Really no change in the 2 wounds since last week 01/11/18; the patient is down 13 pounds. He put his stage III lymphedema left leg in 4K compression last week and there is less edema fluid however we still haven't been able to communicate with home health but apparently it is [facility] but the dressings have not been changed. The patient noted an odor last week. He is also had compression pumps ordered by [facility]  this as [name] not completed the paperwork stage. 01/18/18; patient continues to lose weight. Stage III lymphedema in the left greater than right leg under for alert compression. The major wound is on the left lateral ankle area. He apparently has bilateral compression pumps being brought to his house, these were ordered by [facility] Notable for the fact today he had some blisters on the right anterior leg together with some skin nodules. This is no doubt secondary to severe lymphedema. 01/25/18; the patient has obtained his compression pumps and is using them per vein and vascular instructions 3 times [name] [name] for an hour. He also saw [name] of vein and vascular. He was felt to have venous insufficiency but did not suggest any intervention also improved edema. It was suggested that he have compression stockings 20-30 mm on [name] daily basis in addition to compression pumps. The patient arrives in clinic today with [name] layer of unna under for layer compression. he seems to have some trouble with the degree of compression. He has open areas on the left lateral ankle area which is his major wound left upper medial calf and [name] superficial open area on the right anterior shin area which was blistered last week. He has skin changes on the right anterior calf which I think are no doubt secondary to lymphedema skin nodules etc. 02/01/18; the patient comes in telling us his nephrologist have to his torsemide. Unfortunately today he is put on 7 pounds by our scales. He has blisters all over the anterior and medial part of his right calf and [name] new open wound. He also has soupy green drainage coming out of the left lateral calf /ankle wound. 02/08/18; culture I did last week of the left lateral ankle wound grew both Pseudomonas and Morganella. He is on Keflex from Dr. Sampson Goon in the hospital. I will need to review these notes.in any case Keflex is not going to cover these 2 organisms. I'm probably going to added  ciprofloxacin today for 1 week. [name] lot of drainage that looks purulent last week. He is not complaining of pain however he has managed to put on 10 pounds in 2 weeks by our scales in this clinic. He is going to see his nephrologist tomorrow 02/15/18; he completed the ciprofloxacin I gave him last week. Notable that he  is up to 379 pounds today which is up 16 pounds from 2 weeks ago. He is complaining of orthopnea but doesn't have any chest pain. 02/22/18; he continues to have weight gain. R intake nurse reports again purulent green drainage coming out of the lateral wound on the lateral left calf. He has small open area on the right anterior leg. His torsemide was increased to 2 tablets [name] [name] I believe this is 40 mg last week in response to the call admitted to Dr. Doristine Church office. He follows up with Dr. Thedore Mins and Dr. Gilda Crease tomorrow 03/01/18 his weight essentially stable today at 383 pounds. Drainage out of the left lateral wound on the lower left calf/ankle is [name] lot less. Culture last time grew Pseudomonas. I put him on cefdinir. He has been to Dr. Doristine Church office no adjustments in his diuretics. Dr. Dion Saucier prescribed [name] wraparound stocking for the right leg.there is no open area on the right leg. He has [name] superficial area on the left medial calf, left posterior calf and in the large area on the left lateral however this looks better 03/15/18; weight is not up to 393 pounds. He saw his nephrologist yesterday Dr. Thedore Mins will increase the Demadex I'm hopeful this will help with the edema control. I'm using silver alginate to all his wounds. In particular the left lateral ankle looks better. Unfortunately he has new open areas on the right lateral calf that will include use of his compression stocking at least in the short to medium term. He has new wounds 3 on the right lateral calf. One of these has some size however all numerous superficial 03/22/18; his weight is stabilized [name] bit. Just adjustment of his  diuretics by his nephrologist. There is no doubt he has some degree of systemic fluid overload on [name] [name], [name] [name] ([id number]) [id number] Page 9 of 13 top of severe left greater than right lymphedema. 2 weeks ago tried to transition him to stockings on the right leg however he developed re-breakdown of skin on the right leg and we had to put him back in compression last week. He also uses external compression pumps and claims to be compliant Continued concern about his depression today 03/29/18; several ongoing issues with this patient; He no longer has home health coverage apparently secondary to [name] lapse in insurance. He is apparently transitioning from short for long-term disability. [facility] was changing his compression wraps on Monday and Friday. He has gained 10 pounds since last week Sees Dr. Lorretta Harp tomorrow Saw his primary doctor last week about the depression. It sounds as though he declined pharmacologic management. He seems somewhat better today. We were concerned last week when he came. Somewhat better today He is using his compression pumps once [name] [name] at home, I have asked for twice [name] [name] if possible especially on the left leg Follows up with his nephrologist in mid-August. He is managing his diuretic for I think stage IV chronic renal failure Paradoxically his wounds actually look better 04/19/18; the patient has not been seen since I last saw him 3 weeks ago. He saw Dr. Gilda Crease of vascular surgery on 03/30/18 I believe he put him in [name] 20/30 stocking bilaterally with [name] wraparound extremitease stocking. He has not been putting anything specifically on the wound. More problematic than that he has not been wearing the stockings he is at home. He has been using his external compression pumps once per [name] according to him on [name] rare occasion twice On  [name] psychosocial level the patient is now on long-term disability and is applying for COBRA therefore he is  between insurances. He has not been able to follow up with Dr. Thedore Mins who is his nephrologist as [name] result. As noted his weight is up to 407 pounds today. He promises me he'll follow-up with Dr. Thedore Mins 05/03/18; he hasn't been here in 2 weeks now. He apparently has been wearing [name] compression sock on the left leg. Massive increase in edema 3 large open wounds on the left anterior leg that were probably blisters. Significant deterioration in the left lateral ankle wound that we've been doing as his most problematic wound. He still does not have his insurance issues wrapped up. His weight is well over 400 pounds. 05/10/18; arrives today with better looking edema control in the left leg. He has been using his compression pumps twice [name] [name] We also wrapped it is left leg for there he's been using his pumps so there is much better edema control. Most of new wounds from last week look [name] lot better. Even the refractory area on the lower left lateral ankle looks [name] lot better to me today. He follows up with Dr. Thedore Mins this morning [nephrology] 05/17/18 patient arrives today with [name] lot less edema in the left leg. His weight is gone down 7 pounds. He tells me he saw Dr. Thedore Mins but his torsemide was not adjusted. He is using the palms 3 times [name] [name] There is been quite an improvement in the remaining wound on the left lateral ankle Weekly visit for follow-up of bilateral lower extremity wounds related to severe lymphedema and probably some degree of systemic fluid overload from chronic renal failure stage IV. His weight is up this week to over 400 pounds. He noted increasing edema in the right leg late last week. He took his compression stocking off he did not increase the frequency of this compression pump use. He developed [name] large blister on the back of the right calf. He also has [name] new opened blister on the left lateral calf in addition to the wound that we've been using on the distal left lower calf area and we've been  using silver alginate. He tells me that he has an ultrasound which is [name] DVT rule out and I think [name] reflux study ordered by Dr. Lorretta Harp 05/31/18; weekly visit. He went to see vein and vascular this week apparently they remove the 4-layer compression on the left and put an Unna boot on him in replacement. He's got more swelling in the left leg is resolved. That being said his left lower Wound is better. He still has [name] fairly large wound on the right posterior calf this was not disturbed. He has [name] follow-up appointment with Dr. Doristine Church nephrologist next week 06/07/2018; the patient arrives today with [name] history that he took both his compression wraps off 3 days ago in order to take [name] shower. He has bilateral severe tense blisters. [name] lot of weeping erythema especially on the lateral left calf. Almost circumferential blisters on the right. Multiple areas of epithelial breakdown. He does not complain of any pain fever chills. He states he has been using his compression pump in some form of stocking that I could not really determine the type. He did not come into the clinic with anything on his legs. He tells me he has an appointment with Dr. Ella Jubilee his nephrologist I believe this Friday. Paradoxically his weight is actually down to 385 pounds I  believe last week he was over 400 06/14/18; arrives with better looking wound surfaces today on both legs. There is no further blistering however there are still areas that aren't epithelialized on the right anterior, right lateral, left posterior and left medial. His original wound just above the left ankle laterally is still open moist. His weight was about the same today. He tells me that his nephrologist increase the torsemide from 40/20/09/1939/40 twice [name] [name] 06/21/18; both his wraps fell down to his mid calf. Has [name] result he has multiple blisters across the anterior right leg above with the wraps ultimately watched. He also has blisters on the posterior part of the  left calf 2. His original wound on the left lateral calf is hard to see any open area. There is however [name] divot. Which is going to be difficult to deal with into the future until the edema in the left leg is controlled. 2 small superficial areas remain He tells [place] his weight was 386 pounds on his scale at home. According to our scale he is up 5 pounds. He is not on [name] fluid restriction 06/28/18; patient's weight is gone up 2 pounds since last time. He has weeping edema and open superficial wounds on the right posterior right lateral and right anterior calf. He has [name] small open area on the left anterior probably left posterior calf and the original wound on the left lateral calf appears to be just about closed He is being planned for [name] dialysis shunt in the right arm through vein and vascular incoordination with his nephrologist Dr. Thedore Mins He claims to be using his compression pumps twice [name] [name] He has lymphedema and no doubt systemic fluid volume overload from stage IV chronic renal failure 07/04/18; patient's weight is up into the 396 range. He is supposed to see his cardiologist tomorrow and plans are being made for [name] shunt by Dr. Gilda Crease. He still has significant open wounds/draining areas on the right anterior and right posterior calf. Several small open areas on the left which are less in terms of wound area on the right which is surprising given the fact the left is the larger most lymphedematous leg 07/26/2018 She is seen today for follow-up and management of lateral posterior lower leg wound and bilateral lymphoedema. He has [name] very flat affect and depressed mood today. Unable to elicit much information from him today. No thoughts of harm to self identified. Recently had [name] follow-up with vascular vein for fistula placement to initiate dialysis in the right arm. He has 2 dressings on the right arm from the fistula procedure with no bleeding or drainage noted on the dressing. He does have [name] large  amount of bruising in the surrounding to puncture areas on the right lower arm from the fistula placement. No pain elicited with palpation of the right arm. He denies [name] diminished sensation in that right lower arm as well. He does have [name] small wound to the left lateral posterior lower leg. No visual drainage present to either leg. However the wound to the left lower leg does have [name] foul odor even after cleaning it. The left is the larger most lymphedematous leg with the right Leg still having [name] significant amount of edema. No drainage or blisters present on either leg today. He states that he is using the lymphedema pumps twice [name] [name] Not too sure if he is compliant with actually using the lymphedema pumps based on the amount of edema that he has  in his legs. Weight increased from 396.1 to 401.6. No recent fevers, chills, or shortness of breath. 08/02/18; the patient only has one remaining wound on the left lateral calf which is still open. This is in the resultant [place] shaped area which was once the site of the major wound. He does not have any open areas on the right leg nor additional wounds on the left no blistering. As usual the left leg is much larger than the right. He comes in today stating that he took the wraps off on the right leg on Friday and since then he has presumably been wearing his stocking which is [name] wraparound variant stocking on the right. States he took the 4-layer compression off his left leg this morning to shower. He is seeing vein and vascular tomorrow. He states he is using his compression pumps once or twice [name] [name] He also admits that he has been on his feet [name] lot 08/09/18; the patient has the left leg healed except for the original wound site on the left medial Just above the ankle. On the right he has [name] new wound on the right lateral calf. He saw vein and vascular last week and he is been back in his pressure stockings and wraparound stockings. He also is been put  on metolazone 3 times [name] week by nephrology along with his torsemide. I'm hopeful that this will help with some of the lower extremity edema. I've always felt that he has lymphedema but there may be [name] secondary component contributing 08/16/18; the patient used his own junk slight stockings to both legs last week. He claims he is pumping once to twice [name] [name] at home. Here rise with his legs looking visibly less edematous. His usual left greater than right secondary to lymphedema. He is tolerating the metolazone 3 times [name] week along with the torsemide. His weight was down 2 pounds. He follows up with nephrology soon for follow-up lab work I think in early January. He does not have any open areas on the right leg he still has the crevice on the lateral left calf just above the ankle there is 2 small open areas here I am not sure that this is ever going to be free of an open area although certainly not worsening. More problematic lead he has 2 blistered areas just above the wrist on the left lateral calf [name] [name], [name] [name] ([id number]) [id number] Page 10 of 13 09/06/2018; the patient arrives with much larger legs than I remember seeing [name] month ago especially on the right. He has 1 open area on the left lateral calf and he had [name] 4 layer compression on this on arrival in the clinic. This was apparently put on by [facility] in response to [name] new open wound. He claims he is pumping once or more often twice [name] [name] although I really have [name] hard time believing it. He came in with nothing but stockings on his legs. There is tremendous increase in the edema bilaterally 1/22; the patient only has the one area that is not totally closed and that is the divot on the left lateral calf. I do not think we are going to be able to do anything further to this unless he loses edema fluid in his left leg when he starts dialysis which I gather is sometime soon. He is using his own wraparound compression  garment. He is using the compression pumps twice [name] [name] 2/19; the patient does not have any open wounds  on the right leg but both legs left and the right have extensive edema is usually worse on the left. He has [name] tense blister on the left anterior leg. Using his compression pumps usually once [name] [name] per his description. He does not come in with the compression stockings 2/26. Right leg remains without any open wounds that he has his own stocking. The tense blister on the left anterior leg is closed over. He still has the open area on the left lateral mid calf and then the divot injury area which I do not think is going to heal just above the left ankle. 3/11; the patient arrives with all the wounds on both legs healed. He has his stocking with the wraparound juxta lite on the right. He has 1 for the left. He initiated dialysis on Monday and we will see what effect that has on his lower extremity edema. He has his compression pumps and he plans to use them at least once [name] [name] Readmission 01/20/2022 Mr. Costlow Brensinger is [name] 52 year old male with [name] past medical history of type 1 diabetes on hemodialysis that presents to the clinic for [name] 1 year history of wound to the right side of his neck. He states that he had [name] central line in place when he started hemodialysis while his graft matured. He states that the area where the line was removed never healed. He reports serosanguineous drainage and odor to the area over the past year. He has not taken antibiotics for this issue. He currently denies systemic signs of infection. He currently keeps the area covered with [name] Band-Aid. 5/31; patient presents for follow-up. He has been taking doxycycline without issues. He had [name] culture done at last clinic visit that showed abundant Streptococcus anginosus sensitive to penicillin. I will go ahead and switch him to Augmentin. He has been using mupirocin ointment to the wound bed and covering this with Hydrofera Blue. Upon  inspection today there is [name] piece of dressing tightly adhered in the wound bed that appears to be connected to underlying structures. He obtains his neck ultrasound tomorrow. He denies systemic signs of infection. 6/14; Patient presents for follow-up. Patient had an ultrasound to his neck that showed [name] prior PermCath still present is [name] retained foreign body. This was causing his wound not to heal. On 6/7 the foreign body was removed by Dr.Schnier. Since then the area has healed up nicely. He has no issues or complaints today. He denies any drainage, open wounds or discomfort to the previous wound site. 02/16/2023 Mr. [name] [name] [name] [name] is [name] 52 year old male with [name] past medical history of end-stage renal disease on hemodialysis, Type 2 diabetes, And lymphedema/venous insufficiency That presents to the clinic for evaluation of his right lower extremity. He has wounds to his left lower extremity for several months but these are being managed by vein and vascular with [name] wound VAC. On 02/04/2023 patient was evaluated for new blisters to the right lower extremity and potential cellulitis at vein and vascular and was recommended to go to the ED. He was started on oral antibiotics by the ED and referred to our clinic for follow-up to assure that the right leg wounds improved. He had an Unna boot placed by vein and vascular two days prior. He has home health that changes the unna boot and wound vac 2 times weekly. He currently denies signs of infection. There are no open wounds to the right lower extremity. 7/10; Patient was seen last month for  left lower extremity wounds however this was in the care of vein and vascular and I recommended he discuss with his doctor if he would like to transfer care to the wound care center. At this time patient states that he would like to follow here. He has been using [name] wound VAC to the left lower extremity large wound and he has been keeping the area covered to the dorsal left foot  wound. He would like to stop the wound VAC as He has had trouble with the seal. He currently denies systemic signs of infection. 7/17; patient presents for follow-up. We have been using Dakin's wet-to-dry dressings under Kerlix/Coban to the left lower extremity large wound and Silver alginate to the left dorsal foot wound. Wounds are smaller. Patient has no issues or complaints. 7/24; patient presents for follow-up. We have been using Dakin's wet-to-dry dressings to the larger wound and silver alginate to the smaller dorsal foot wound to the left lower extremity all under Kerlix/Coban. Wounds are smaller today. Patient has no issues or complaints. Originally patient had [name] wound VAC. He still has this in his possession but we are not using it. I recommended He returned this. He would also like new lymphedema pumps. 7/31; patient presents for follow-up. We have been using Dakin's wet-to-dry dressings under Kerlix/Coban to the left lower extremity larger wound and silver alginate to the smaller dorsal left foot wound. He was noted to have [name] fever during dialysis and was found to be bacteremic. He has been in the hospital for the past few days. He is receiving IV cefazolin during dialysis now. He has no issues or complaints about the wounds. The left dorsal foot wound is healed today. He currently denies systemic signs of infection. 8/7; patient presents for follow-up. We have been using silver alginate under Kerlix/Coban to the left lower extremity. Wound is smaller. 8/14; this patient has [name] reasonably large wound on the left leg. Dimensions are marginally impaired. He has chronic venous insufficiency and his edema is poorly controlled. We have been using kerlix Coban compression. Patient also has external compression pumps at home but he has not been using them. He also has dialysis Tuesday Thursdays and Saturdays 8/21; patient presents for follow-up. We have been using silver cell under compression  therapy. The compression wrap was increased to 3 layer at last clinic visit. He states he tolerated this well. He has no issues or complaints today. Wound is smaller. 8/28; patient presents for follow-up. We have been using silver alginate under compression therapy to the left lower extremity. Wound appears well-healing. Unfortunately has developed another wound just lateral to this that looks like it started out as [name] blister. 9/4; patient presents for follow-up. We have been using silver alginate under compression therapy to the left lower extremity. Wound is smaller. New wound at last clinic visit on the lateral aspect has closed. 9/11; patient presents for follow-up. We have been using silver alginate under compression therapy to the left lower extremity. Wound continues to become smaller. 9/18; patient with longstanding chronic lymphedema. Wound on the left medial lower leg. We have been using silver alginate under Urgo K2 lite. The wound is improved in terms of appearance and measurements per our intake nurse 9/25; chronic longstanding lymphedema. Wound on the left medial lower leg. Measurements are better this week. Last week I think I changed him to [facility] still under and Urgo K2 lite 10/2; chronic longstanding lymphedema with [name] weak wound on the left anterior  medial lower leg. Not much change in dimension this week slightly hyper granulated. I thought I had changed him to [facility] however according to our intake nurse he has been receiving silver alginate. We are using an Urgo K2 light compression because TBI's on the left were only 0.33 is ABIs were noncompressible. He follows with vein and vascular for his arterial insufficiency 10/9; wound is measuring smaller on the left anterior lower leg. We have been using Hydrofera Blue under Urgo K2 compression. He tells me that he is getting/has received new compression pumps I have asked him to use it 1 hour twice [name] [name] even over our  compression wraps. 10/16; we have been using Hydrofera Blue under Urgo K2 compression. Wound is measuring smaller and looks reasonably healthy. Unfortunately he did not get [name] chance to consistently use the compression pumps twice [name] [name] 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 Furthermore he works nights 10/23; Hydrofera Blue under Urgo K2 compression for [name] wound on his left medial lower leg. The wound measures smaller today. Noticeable improvement in the edema he tells me he is using his external compression pumps once [name] [name] at least sometimes twice. He has [name] difficult work schedule and works nights MATTHEUS, LUGG [name] ([id number]) [id number] Page 11 of 13 10/30; we are using Hydrofera Blue under Urgo K2 compression. Wound continues to gradually get smaller. He is using his compression pumps at home once [name] [name] 07-06-2023 upon evaluation today patient appears to be doing well currently in regard to his wound. This is actually showing signs of excellent epithelization and granulation at this point. Fortunately I do not see any evidence of worsening overall and I do believe that the patient is making good headway here towards closure. Objective Constitutional Well-nourished and well-hydrated in no acute distress. Vitals Time Taken: 8:08 AM, Height: 76 in, Weight: 400 lbs, BMI: 48.7, Temperature: 98.3 F, Pulse: 82 bpm, Respiratory Rate: 18 breaths/min, Blood Pressure: 147/79 mmHg. Respiratory normal breathing without difficulty. Psychiatric this patient is able to make decisions and demonstrates good insight into disease process. Alert and Oriented x 3. pleasant and cooperative. General Notes: Upon inspection patient's wound bed actually showed signs of good granulation and epithelization at this point. Fortunately I do not see any signs of worsening overall and I do believe that the patient is making really good headway here towards closure  which is good news there was some mild debridement to perform which I did today and postdebridement wound bed actually appears to be doing much better which is great news. Integumentary (Hair, Skin) Wound #22 status is Open. Original cause of wound was Gradually Appeared. The date acquired was: 08/30/2022. The wound has been in treatment 20 weeks. The wound is located on the Left Lower Leg. The wound measures 0.6cm length x 0.7cm width x 0.1cm depth; 0.33cm^2 area and 0.033cm^3 volume. There is Fat Layer (Subcutaneous Tissue) exposed. There is no tunneling or undermining noted. There is [name] medium amount of serosanguineous drainage noted. There is large (67-100%) red granulation within the wound bed. There is no necrotic tissue within the wound bed. Assessment Active Problems ICD-10 Non-pressure chronic ulcer of other part of left lower leg with fat layer exposed Chronic venous hypertension (idiopathic) with ulcer of left lower extremity Lymphedema, not elsewhere classified Chronic venous hypertension (idiopathic) with inflammation of right lower extremity Type 2 diabetes mellitus with other skin ulcer Procedures Wound #22 Pre-procedure diagnosis of  Wound #22 is [name] Diabetic Wound/Ulcer of the Lower Extremity located on the Left Lower Leg .Severity of Tissue Pre Debridement is: Fat layer exposed. There was [name] Excisional Skin/Subcutaneous Tissue Debridement with [name] total area of 0.33 sq cm performed by Allen Derry, PA-C. With the following instrument(s): Curette to remove Viable and Non-Viable tissue/material. Material removed includes Subcutaneous Tissue, Slough, Skin: Dermis, and Skin: Epidermis. No specimens were taken. [name] time out was conducted at 08:54, prior to the start of the procedure. [name] Minimum amount of bleeding was controlled with N/[name] The procedure was tolerated well with [name] pain level of 0 throughout and [name] pain level of 0 following the procedure. Post Debridement Measurements: 0.6cm length x  0.7cm width x 0.1cm depth; 0.033cm^3 volume. Character of Wound/Ulcer Post Debridement is improved. Severity of Tissue Post Debridement is: Fat layer exposed. Post procedure Diagnosis Wound #22: Same as Pre-Procedure Plan Follow-up Appointments: Return Appointment in 1 week. Home Health: Home Health Company: - [facility] for wound care. May utilize formulary equivalent dressing for wound treatment orders unless otherwise specified. Home Health Nurse may visit PRN to address patients wound care needs. [name] [phone] Change on Monday and Friday. Wound Care Center will change on Wednesday Scheduled days for dressing changes to be completed; exception, patient has scheduled wound care visit that [name] [name] [name], [name] [name] ([id number]) [id number] Page 12 of 13 **Please direct any NON-WOUND related issues/requests for orders to patient's Primary Care Physician. **If current dressing causes regression in wound condition, may D/C ordered dressing product/s and apply Normal Saline Moist Dressing daily until next Wound Healing Center or Other MD appointment. **Notify Wound Healing Center of regression in wound condition at [phone]. Bathing/ Shower/ Hygiene: May shower with wound dressing protected with water repellent cover or cast protector. No tub bath. Anesthetic (Use 'Patient Medications' Section for Anesthetic Order Entry): Lidocaine applied to wound bed WOUND #22: - Lower Leg Wound Laterality: Left Cleanser: Soap and Water 3 x Per Week/30 Days Discharge Instructions: Gently cleanse wound with antibacterial soap, rinse and pat dry prior to dressing wounds Cleanser: Wound Cleanser 3 x Per Week/30 Days Discharge Instructions: Wash your hands with soap and water. Remove old dressing, discard into plastic bag and place into trash. Cleanse the wound with Wound Cleanser prior to applying [name] clean dressing using gauze sponges, not tissues or cotton balls.  Do not scrub or use excessive force. Pat dry using gauze sponges, not tissue or cotton balls. Prim Dressing: Hydrofera Blue Ready Transfer Foam, 2.5x2.5 (in/in) 3 x Per Week/30 Days ary Discharge Instructions: Apply Hydrofera Blue Ready to wound bed as directed Secondary Dressing: Zetuvit Plus 4x4 (in/in) 3 x Per Week/30 Days Secured With: [employer] Dressing, Latex-free, Size 5, Small-Head / Shoulder / Thigh 3 x Per Week/30 Days Discharge Instructions: over wrap for comfort Com pression Wrap: Urgo K2, two layer compression system, large 3 x Per Week/30 Days 1. I would recommend that we have the patient continue to monitor for any signs of infection or worsening. Based on what I am seeing I do believe that we are making good headway here towards closure. 2. I am going to recommend that he continue with the [facility] which I think is doing awesome. We can continue with Urgo K2 wrap and Zetuvit as well. We will see patient back for reevaluation in 1 week here in the clinic. If anything worsens or changes patient will contact our office for additional recommendations. Electronic Signature(s) Signed: 07/06/2023 10:05:20 AM  By: Allen Derry PA-C Entered By: Allen Derry on 07/06/2023 10:05:20 -------------------------------------------------------------------------------- SuperBill Details Patient Name: Date of Service: [name], CO SLO W [name] 07/06/2023 Medical Record Number: [id number] Patient Account Number: [id number] Date of Birth/Sex: Treating RN: 23-Nov-1970 (52 y.o. [name]) [name] Primary Care Provider: [name] Other Clinician: Referring Provider: Treating Provider/Extender: [name] in Treatment: 20 Diagnosis Coding ICD-10 Codes Code Description [phone] Non-pressure chronic ulcer of other part of left lower leg with fat layer exposed I87.312 Chronic venous hypertension (idiopathic) with ulcer of left lower extremity I89.0 Lymphedema, not elsewhere  classified I87.321 Chronic venous hypertension (idiopathic) with inflammation of right lower extremity E11.622 Type 2 diabetes mellitus with other skin ulcer Facility Procedures : CPT4 Code: [id number] Description: 11042 - DEB SUBQ TISSUE 20 SQ CM/< ICD-10 Diagnosis Description L97.822 Non-pressure chronic ulcer of other part of left lower leg with fat layer expos Modifier: ed Quantity: 1 Physician Procedures LOUAY, TENNESSEN [name] ([id number]): CPT4 Code Description [id number] 11042 - WC PHYS SUBQ TISS 20 SQ CM ICD-10 Diagnosis Description L97.822 Non-pressure chronic ulcer of other part of left lower leg with [id number] Page 13 of 13: Quantity Modifier 1 fat layer exposed Electronic Signature(s) Signed: 07/06/2023 10:05:29 AM By: Allen Derry PA-C Entered By: Allen Derry on 07/06/2023 10:05:29

## 2023-07-06 NOTE — Progress Notes (Addendum)
COMER, BUYER A ([id number]) [id number] Page 1 of 9 Visit Report for 07/06/2023 Arrival Information Details Patient Name: Date of Service: [name], CO [place] W A. 07/06/2023 8:00 A M Medical Record Number: [id number] Patient Account Number: [id number] Date of Birth/Sex: Treating RN: 12-09-1970 (52 y.o. [name]) [name] Primary Care Ludean Duhart: [name] Other Clinician: Referring Jovon Streetman: Treating Devi Hopman/Extender: [name] in Treatment: 20 Visit Information History Since Last Visit Added or deleted any medications: No Patient Arrived: Ambulatory Any new allergies or adverse reactions: No Arrival Time: 08:08 Had a fall or experienced change in No Accompanied By: self activities of daily living that may affect Transfer Assistance: None risk of falls: Patient Identification Verified: Yes Signs or symptoms of abuse/neglect since last visito No Secondary Verification Process Completed: Yes Hospitalized since last visit: No Patient Requires Transmission-Based Precautions: No Implantable device outside of the clinic excluding No Patient Has Alerts: Yes cellular tissue based products placed in the center Patient Alerts: Patient on Blood Thinner since last visit: ABI R [place] TBI .61 10/11/22 Has Dressing in Place as Prescribed: Yes ABI L  TBI .33 10/11/22 Has Compression in Place as Prescribed: Yes Pain Present Now: No Electronic Signature(s) Signed: 07/13/2023 4:43:41 PM By: [name] RN Entered By: [name] on 07/06/2023 08:08:25 -------------------------------------------------------------------------------- Clinic Level of Care Assessment Details Patient Name: Date of Service: [name], [name] 07/06/2023 8:00 A M Medical Record Number: [id number] Patient Account Number: [id number] Date of Birth/Sex: Treating RN: 1971/01/21 (52 y.o. [name] Primary Care Symphani Eckstrom: [name] Other Clinician: Referring  Skarleth Delmonico: Treating Greydon Betke/Extender: [name] in Treatment: 20 Clinic Level of Care Assessment Items TOOL 1 Quantity Score []  - 0 Use when EandM and Procedure is performed on INITIAL visit ASSESSMENTS - Nursing Assessment / Reassessment []  - 0 General Physical Exam (combine w/ comprehensive assessment (listed just below) when performed on new pt. evals) []  - 0 Comprehensive Assessment (HX, ROS, Risk Assessments, Wounds Hx, etc.) [name], [name] A ([id number]) [id number] Page 2 of 9 ASSESSMENTS - Wound and Skin Assessment / Reassessment []  - 0 Dermatologic / Skin Assessment (not related to wound area) ASSESSMENTS - Ostomy and/or Continence Assessment and Care []  - 0 Incontinence Assessment and Management []  - 0 Ostomy Care Assessment and Management (repouching, etc.) PROCESS - Coordination of Care []  - 0 Simple Patient / Family Education for ongoing care []  - 0 Complex (extensive) Patient / Family Education for ongoing care []  - 0 Staff obtains Chiropractor, Records, T Results / Process Orders est []  - 0 Staff telephones HHA, Nursing Homes / Clarify orders / etc []  - 0 Routine Transfer to another Facility (non-emergent condition) []  - 0 Routine Hospital Admission (non-emergent condition) []  - 0 New Admissions / Manufacturing engineer / Ordering NPWT Apligraf, etc. , []  - 0 Emergency Hospital Admission (emergent condition) PROCESS - Special Needs []  - 0 Pediatric / Minor Patient Management []  - 0 Isolation Patient Management []  - 0 Hearing / Language / Visual special needs []  - 0 Assessment of Community assistance (transportation, D/C planning, etc.) []  - 0 Additional assistance / Altered mentation []  - 0 Support Surface(s) Assessment (bed, cushion, seat, etc.) INTERVENTIONS - Miscellaneous []  - 0 External ear exam []  - 0 Patient Transfer (multiple staff / Nurse, adult / Similar devices) []  - 0 Simple Staple /  Suture removal (25 or less) []  - 0 Complex Staple / Suture removal (26 or more) []  - 0 Hypo/Hyperglycemic Management (do not  check if billed separately) []  - 0 Ankle / Brachial Index (ABI) - do not check if billed separately Has the patient been seen at the hospital within the last three years: Yes Total Score: 0 Level Of Care: ____ Electronic Signature(s) Signed: 07/13/2023 4:43:41 PM By: [name] RN Entered By: [name] on 07/06/2023 08:58:08 -------------------------------------------------------------------------------- Complex / Palliative Patient Assessment Details Patient Name: Date of Service: [name], [name] 07/06/2023 8:00 A M Medical Record Number: [id number] Patient Account Number: [id number] Date of Birth/Sex: Treating RN: 1971-04-01 (52 y.o. [name] Primary Care Aunica Dauphinee: [name] Other Clinician: Referring Mayda Shippee: Treating Pax Reasoner/Extender: Nicolette Bang A ([id number]) [id number] Page 3 of 9 Weeks in Treatment: 20 Complex Wound Management Criteria Patient has remarkable or complex co-morbidities requiring medications or treatments that extend wound healing times. Examples: Diabetes mellitus with chronic renal failure or end stage renal disease requiring dialysis Advanced or poorly controlled rheumatoid arthritis Diabetes mellitus and end stage chronic obstructive pulmonary disease Active cancer with current chemo- or radiation therapy DM Dialysis HTN CKD, PVD, venous insuff Palliative Wound Management Criteria Care Approach Wound Care Plan: Complex Wound Management Electronic Signature(s) Signed: 07/06/2023 9:37:44 AM By: [name] RN Signed: 07/07/2023 11:25:01 AM By: Allen Derry PA-C Entered By: [name] on 07/06/2023 09:37:44 -------------------------------------------------------------------------------- Encounter Discharge Information Details Patient Name: Date of  Service: [name], [name] 07/06/2023 8:00 A M Medical Record Number: [id number] Patient Account Number: [id number] Date of Birth/Sex: Treating RN: [birth year] (52 y.o. [name] Primary Care Aishi Courts: [name] Other Clinician: Referring Keldon Lassen: Treating Katrice Goel/Extender: [name] in Treatment: 20 Encounter Discharge Information Items Post Procedure Vitals Discharge Condition: Stable Temperature (F): 98.3 Ambulatory Status: Ambulatory Pulse (bpm): 82 Discharge Destination: Home Respiratory Rate (breaths/min): 18 Transportation: Private Auto Blood Pressure (mmHg): 147/79 Accompanied By: self Schedule Follow-up Appointment: Yes Clinical Summary of Care: Electronic Signature(s) Signed: 07/13/2023 4:43:41 PM By: [name] RN Entered By: [name] on 07/06/2023 08:58:56 -------------------------------------------------------------------------------- Lower Extremity Assessment Details Patient Name: Date of Service: [name], [name] 07/06/2023 8:00 A M Medical Record Number: [id number] Patient Account Number: [id number] SHARBEL, CIOFFI A ([id number]) [phone].pdf Page 4 of 9 Date of Birth/Sex: Treating RN: 1971-04-14 (52 y.o. [name]) [name] Primary Care Valbona Slabach: Other Clinician: [name] Referring Jadrian Bulman: Treating Quran Vasco/Extender: [name] in Treatment: 20 Edema Assessment Assessed: Kyra Searles: No] Franne Forts: No] Edema: [Left: Ye] [Right: s] Calf Left: Right: Point of Measurement: 40 cm From Medial Instep 44 cm Ankle Left: Right: Point of Measurement: 10 cm From Medial Instep 45 cm Knee To Floor Left: Right: From Medial Instep 56 cm Vascular Assessment Extremity colors, hair growth, and conditions: Extremity Color: [Left:Hyperpigmented] Hair Growth on Extremity: [Left:No] Temperature of Extremity: [Left:Warm] Capillary Refill: [Left:< 3 seconds] Dependent Rubor:  [Left:No] Blanched when Elevated: [Left:No No] Toe Nail Assessment Left: Right: Thick: Yes Discolored: Yes Deformed: Yes Improper Length and Hygiene: Yes Electronic Signature(s) Signed: 07/13/2023 4:43:41 PM By: [name] RN Entered By: [name] on 07/06/2023 08:20:48 -------------------------------------------------------------------------------- Multi Wound Chart Details Patient Name: Date of Service: [name], [name] 07/06/2023 8:00 A M Medical Record Number: [id number] Patient Account Number: [id number] Date of Birth/Sex: Treating RN: 07/23/1971 (52 y.o. [name] Primary Care Santiel Topper: [name] Other Clinician: Referring Marnie Fazzino: Treating Lorrin Nawrot/Extender: [name] in Treatment: 20 Vital Signs Height(in): 76 Pulse(bpm): 82 Weight(lbs): 400 Blood Pressure(mmHg): 147/79 Body Mass Index(BMI):  48.7 Temperature(F): 98.3 Respiratory Rate(breaths/min): 18 [name], [name] A ([id number]) [id number] Page 5 of 9 [22:Photos:] [N/A:N/A] Left Lower Leg N/A N/A Wound Location: Gradually Appeared N/A N/A Wounding Event: Diabetic Wound/Ulcer of the Lower N/A N/A Primary Etiology: Extremity Hypertension, Type II Diabetes, N/A N/A Comorbid History: Neuropathy 08/30/2022 N/A N/A Date Acquired: 20 N/A N/A Weeks of Treatment: Open N/A N/A Wound Status: No N/A N/A Wound Recurrence: 0.6x0.7x0.1 N/A N/A Measurements L x W x D (cm) 0.33 N/A N/A A (cm) : rea 0.033 N/A N/A Volume (cm) : 99.60% N/A N/A % Reduction in A rea: 99.90% N/A N/A % Reduction in Volume: Grade 2 N/A N/A Classification: Medium N/A N/A Exudate A mount: Serosanguineous N/A N/A Exudate Type: red, brown N/A N/A Exudate Color: Large (67-100%) N/A N/A Granulation A mount: Red N/A N/A Granulation Quality: None Present (0%) N/A N/A Necrotic A mount: Fat Layer (Subcutaneous Tissue): Yes N/A N/A Exposed Structures: Fascia:  No Tendon: No Muscle: No Joint: No Bone: No None N/A N/A Epithelialization: Treatment Notes Electronic Signature(s) Signed: 07/13/2023 4:43:41 PM By: [name] RN Entered By: [name] on 07/06/2023 08:20:52 -------------------------------------------------------------------------------- Multi-Disciplinary Care Plan Details Patient Name: Date of Service: [name], [name] 07/06/2023 8:00 A M Medical Record Number: [id number] Patient Account Number: [id number] Date of Birth/Sex: Treating RN: [birth year] (52 y.o. [name] Primary Care Terrez Ander: [name] Other Clinician: Referring Annaly Skop: Treating Darald Uzzle/Extender: [name] in Treatment: 20 Active Inactive Wound/Skin Impairment [name], [name] A ([id number]) [id number] Page 6 of 9 Nursing Diagnoses: Knowledge deficit related to ulceration/compromised skin integrity Goals: Patient/caregiver will verbalize understanding of skin care regimen Date Initiated: 03/09/2023 Date Inactivated: 04/13/2023 Target Resolution Date: 04/09/2023 Goal Status: Met Ulcer/skin breakdown will have a volume reduction of 30% by week 4 Date Initiated: 03/09/2023 Date Inactivated: 04/13/2023 Target Resolution Date: 04/09/2023 Goal Status: Met Ulcer/skin breakdown will have a volume reduction of 50% by week 8 Date Initiated: 03/09/2023 Date Inactivated: 05/18/2023 Target Resolution Date: 05/10/2023 Goal Status: Met Ulcer/skin breakdown will have a volume reduction of 80% by week 12 Date Initiated: 03/09/2023 Date Inactivated: 06/29/2023 Target Resolution Date: 06/09/2023 Goal Status: Met Ulcer/skin breakdown will heal within 14 weeks Date Initiated: 03/09/2023 Target Resolution Date: 07/10/2023 Goal Status: Active Interventions: Assess patient/caregiver ability to obtain necessary supplies Assess patient/caregiver ability to perform ulcer/skin care regimen upon admission and as  needed Assess ulceration(s) every visit Notes: Electronic Signature(s) Signed: 07/13/2023 4:43:41 PM By: [name] RN Entered By: [name] on 07/06/2023 08:21:02 -------------------------------------------------------------------------------- Pain Assessment Details Patient Name: Date of Service: [name], [name] 07/06/2023 8:00 A M Medical Record Number: [id number] Patient Account Number: [id number] Date of Birth/Sex: Treating RN: 06-14-1971 (52 y.o. [name] Primary Care Mack Thurmon: [name] Other Clinician: Referring Julie Paolini: Treating Wilhelmina Hark/Extender: [name] in Treatment: 20 Active Problems Location of Pain Severity and Description of Pain Patient Has Paino No Site Locations [name], [name] A ([id number]) [id number] Page 7 of 9 Pain Management and Medication Current Pain Management: Electronic Signature(s) Signed: 07/13/2023 4:43:41 PM By: [name] RN Entered By: [name] on 07/06/2023 08:09:22 -------------------------------------------------------------------------------- Patient/Caregiver Education Details Patient Name: Date of Service: [name], [name] 11/6/2024andnbsp8:00 A M Medical Record Number: [id number] Patient Account Number: [id number] Date of Birth/Gender: Treating RN: 04/01/1971 (52 y.o. [name] Primary Care Physician: [name] Other Clinician: Referring Physician: Treating Physician/Extender: [name] in Treatment: 20 Education Assessment Education Provided To: Patient Education  Topics Provided Wound/Skin Impairment: Handouts: Caring for Your Ulcer Methods: Explain/Verbal Responses: State content correctly Electronic Signature(s) Signed: 07/13/2023 4:43:41 PM By: [name] RN Entered By: [name] on 07/06/2023 08:21:23 -------------------------------------------------------------------------------- Wound Assessment  Details Patient Name: Date of Service: [name], [name] 07/06/2023 8:00 A M Medical Record Number: [id number] Patient Account Number: [id number] Date of Birth/Sex: Treating RN: Feb 01, 1971 (52 y.o. [name] Primary Care Louis Ivery: [name] Other Clinician: Referring Sasuke Yaffe: Treating Jayleigh Notarianni/Extender: [name] in Treatment: 20 Wound Status [name], [name] A ([id number]) [id number] Page 8 of 9 Wound Number: 22 Primary Etiology: Diabetic Wound/Ulcer of the Lower Extremity Wound Location: Left Lower Leg Wound Status: Open Wounding Event: Gradually Appeared Comorbid History: Hypertension, Type II Diabetes, Neuropathy Date Acquired: 08/30/2022 Weeks Of Treatment: 20 Clustered Wound: No Photos Wound Measurements Length: (cm) 0.6 Width: (cm) 0.7 Depth: (cm) 0.1 Area: (cm) 0.33 Volume: (cm) 0.033 % Reduction in Area: 99.6% % Reduction in Volume: 99.9% Epithelialization: None Tunneling: No Undermining: No Wound Description Classification: Grade 2 Exudate Amount: Medium Exudate Type: Serosanguineous Exudate Color: red, brown Foul Odor After Cleansing: No Slough/Fibrino Yes Wound Bed Granulation Amount: Large (67-100%) Exposed Structure Granulation Quality: Red Fascia Exposed: No Necrotic Amount: None Present (0%) Fat Layer (Subcutaneous Tissue) Exposed: Yes Tendon Exposed: No Muscle Exposed: No Joint Exposed: No Bone Exposed: No Electronic Signature(s) Signed: 07/13/2023 4:43:41 PM By: [name] RN Entered By: [name] on 07/06/2023 08:17:02 -------------------------------------------------------------------------------- Vitals Details Patient Name: Date of Service: [name], [name] 07/06/2023 8:00 A M Medical Record Number: [id number] Patient Account Number: [id number] Date of Birth/Sex: Treating RN: [birth year] (52 y.o. [name] Primary Care Tildon Silveria: [name] Other  Clinician: Referring Kemisha Bonnette: Treating Madelline Eshbach/Extender: [name] in Treatment: 20 Vital Signs Time Taken: 08:08 Temperature (F): 98.3 Height (in): 76 Pulse (bpm): 82 [name], [name] A ([id number]) [id number] Page 9 of 9 Weight (lbs): 400 Respiratory Rate (breaths/min): 18 Body Mass Index (BMI): 48.7 Blood Pressure (mmHg): 147/79 Reference Range: 80 - 120 mg / dl Electronic Signature(s) Signed: 07/13/2023 4:43:41 PM By: [name] RN Entered By: [name] on 07/06/2023 08:09:12

## 2023-07-07 DIAGNOSIS — N186 End stage renal disease: Secondary | ICD-10-CM | POA: Diagnosis not present

## 2023-07-07 DIAGNOSIS — N2581 Secondary hyperparathyroidism of renal origin: Secondary | ICD-10-CM | POA: Diagnosis not present

## 2023-07-07 DIAGNOSIS — Z992 Dependence on renal dialysis: Secondary | ICD-10-CM | POA: Diagnosis not present

## 2023-07-08 ENCOUNTER — Encounter
Admission: RE | Disposition: A | Discharge status: Home / Self Care | Payer: Self-pay | Source: Home / Self Care | Attending: Vascular Surgery

## 2023-07-08 ENCOUNTER — Ambulatory Visit
Admission: RE | Admit: 2023-07-08 | Discharge: 2023-07-08 | Disposition: A | Discharge status: Home / Self Care | Payer: Medicare HMO | Attending: Vascular Surgery | Admitting: Vascular Surgery

## 2023-07-08 ENCOUNTER — Encounter: Payer: Self-pay | Admitting: Anesthesiology

## 2023-07-08 ENCOUNTER — Encounter: Payer: Self-pay | Admitting: Vascular Surgery

## 2023-07-08 ENCOUNTER — Other Ambulatory Visit: Payer: Self-pay

## 2023-07-08 DIAGNOSIS — E1122 Type 2 diabetes mellitus with diabetic chronic kidney disease: Secondary | ICD-10-CM | POA: Diagnosis not present

## 2023-07-08 DIAGNOSIS — Y841 Kidney dialysis as the cause of abnormal reaction of the patient, or of later complication, without mention of misadventure at the time of the procedure: Secondary | ICD-10-CM | POA: Insufficient documentation

## 2023-07-08 DIAGNOSIS — T82858A Stenosis of vascular prosthetic devices, implants and grafts, initial encounter: Secondary | ICD-10-CM | POA: Insufficient documentation

## 2023-07-08 DIAGNOSIS — N186 End stage renal disease: Secondary | ICD-10-CM | POA: Insufficient documentation

## 2023-07-08 DIAGNOSIS — I120 Hypertensive chronic kidney disease with stage 5 chronic kidney disease or end stage renal disease: Secondary | ICD-10-CM | POA: Insufficient documentation

## 2023-07-08 DIAGNOSIS — Z992 Dependence on renal dialysis: Secondary | ICD-10-CM | POA: Diagnosis not present

## 2023-07-08 HISTORY — PX: A/V FISTULAGRAM: CATH118298

## 2023-07-08 LAB — GLUCOSE, CAPILLARY
Glucose-Capillary: 97 mg/dL (ref 70–99)
Glucose-Capillary: 72 mg/dL (ref 70–99)

## 2023-07-08 LAB — POTASSIUM (ARMC VASCULAR LAB ONLY): Potassium (ARMC vascular lab): 4.6 mmol/L (ref 3.5–5.1)

## 2023-07-08 SURGERY — A/V FISTULAGRAM
Anesthesia: Moderate Sedation | Laterality: Left

## 2023-07-08 MED ORDER — FENTANYL CITRATE (PF) 100 MCG/2ML IJ SOLN
INTRAMUSCULAR | Status: AC
  Filled 2023-07-08: qty 2

## 2023-07-08 MED ORDER — SODIUM CHLORIDE 0.9 % IV SOLN: INTRAVENOUS | Status: DC

## 2023-07-08 MED ORDER — HEPARIN SODIUM (PORCINE) 1000 UNIT/ML IJ SOLN
INTRAMUSCULAR | Status: AC
  Filled 2023-07-08: qty 10

## 2023-07-08 MED ORDER — DIPHENHYDRAMINE HCL 50 MG/ML IJ SOLN: 50.0000 mg | Freq: Once | INTRAMUSCULAR | Status: DC | PRN

## 2023-07-08 MED ORDER — MIDAZOLAM HCL 2 MG/ML PO SYRP: 8.0000 mg | ORAL_SOLUTION | Freq: Once | ORAL | Status: DC | PRN

## 2023-07-08 MED ORDER — MIDAZOLAM HCL 2 MG/2ML IJ SOLN
INTRAMUSCULAR | Status: DC | PRN
  Administered 2023-07-08: 2 mg via INTRAVENOUS
  Administered 2023-07-08: 1 mg via INTRAVENOUS

## 2023-07-08 MED ORDER — HEPARIN (PORCINE) IN NACL 1000-0.9 UT/500ML-% IV SOLN
INTRAVENOUS | Status: DC | PRN
  Administered 2023-07-08: 500 mL

## 2023-07-08 MED ORDER — FAMOTIDINE 20 MG PO TABS: 40.0000 mg | ORAL_TABLET | Freq: Once | ORAL | Status: DC | PRN

## 2023-07-08 MED ORDER — METHYLPREDNISOLONE SODIUM SUCC 125 MG IJ SOLR: 125.0000 mg | Freq: Once | INTRAMUSCULAR | Status: DC | PRN

## 2023-07-08 MED ORDER — FENTANYL CITRATE (PF) 100 MCG/2ML IJ SOLN
INTRAMUSCULAR | Status: DC | PRN
  Administered 2023-07-08 (×3): 50 ug via INTRAVENOUS

## 2023-07-08 MED ORDER — IODIXANOL 320 MG/ML IV SOLN
INTRAVENOUS | Status: DC | PRN
  Administered 2023-07-08: 35 mL

## 2023-07-08 MED ORDER — LIDOCAINE HCL (PF) 1 % IJ SOLN
INTRAMUSCULAR | Status: DC | PRN
  Administered 2023-07-08: 10 mL

## 2023-07-08 MED ORDER — HEPARIN SODIUM (PORCINE) 1000 UNIT/ML IJ SOLN
INTRAMUSCULAR | Status: DC | PRN
  Administered 2023-07-08: 5000 [IU] via INTRAVENOUS

## 2023-07-08 MED ORDER — ONDANSETRON HCL 4 MG/2ML IJ SOLN: 4.0000 mg | Freq: Four times a day (QID) | INTRAMUSCULAR | Status: DC | PRN

## 2023-07-08 MED ORDER — MIDAZOLAM HCL 5 MG/5ML IJ SOLN
INTRAMUSCULAR | Status: AC
  Filled 2023-07-08: qty 5

## 2023-07-08 MED ORDER — HYDROMORPHONE HCL 1 MG/ML IJ SOLN: 1.0000 mg | Freq: Once | INTRAMUSCULAR | Status: DC | PRN

## 2023-07-08 MED ORDER — CEFAZOLIN SODIUM-DEXTROSE 1-4 GM/50ML-% IV SOLN
1.0000 g | INTRAVENOUS | Status: AC
  Administered 2023-07-08: 1 g via INTRAVENOUS

## 2023-07-08 SURGICAL SUPPLY — 17 items
BALLN DORADO 10X40X80 (BALLOONS) ×1
BALLN DORADO 8X40X80 (BALLOONS) ×1
BALLOON DORADO 10X40X80 (BALLOONS) IMPLANT
BALLOON DORADO 8X40X80 (BALLOONS) IMPLANT
CANNULA 5F STIFF (CANNULA) IMPLANT
COVER PROBE ULTRASOUND 5X96 (MISCELLANEOUS) IMPLANT
DEVICE PRESTO INFLATION (MISCELLANEOUS) IMPLANT
DRAPE BRACHIAL (DRAPES) IMPLANT
GOWN STRL REUS W/ TWL LRG LVL3 (GOWN DISPOSABLE) ×1 IMPLANT
GOWN STRL REUS W/TWL LRG LVL3 (GOWN DISPOSABLE) ×1
NDL ENTRY 21GA 7CM ECHOTIP (NEEDLE) IMPLANT
NEEDLE ENTRY 21GA 7CM ECHOTIP (NEEDLE) ×1 IMPLANT
PACK ANGIOGRAPHY (CUSTOM PROCEDURE TRAY) ×1 IMPLANT
SET INTRO CAPELLA COAXIAL (SET/KITS/TRAYS/PACK) IMPLANT
SHEATH BRITE TIP 6FRX5.5 (SHEATH) IMPLANT
SUT MNCRL AB 4-0 PS2 18 (SUTURE) IMPLANT
WIRE SUPRACORE 190CM (WIRE) IMPLANT

## 2023-07-08 NOTE — Interval H&P Note (Signed)
History and Physical Interval Note:  07/08/2023 12:59 PM  [name]  has presented today for surgery, with the diagnosis of L arm fistulagram   End Stage Renal.  The various methods of treatment have been discussed with the patient and family. After consideration of risks, benefits and other options for treatment, the patient has consented to  Procedure(s): A/V Fistulagram (Left) as a surgical intervention.  The patient's history has been reviewed, patient examined, no change in status, stable for surgery.  I have reviewed the patient's chart and labs.  Questions were answered to the patient's satisfaction.     [name]

## 2023-07-09 DIAGNOSIS — I890 Lymphedema, not elsewhere classified: Secondary | ICD-10-CM | POA: Diagnosis not present

## 2023-07-09 DIAGNOSIS — I120 Hypertensive chronic kidney disease with stage 5 chronic kidney disease or end stage renal disease: Secondary | ICD-10-CM | POA: Diagnosis not present

## 2023-07-09 DIAGNOSIS — E1051 Type 1 diabetes mellitus with diabetic peripheral angiopathy without gangrene: Secondary | ICD-10-CM | POA: Diagnosis not present

## 2023-07-09 DIAGNOSIS — E1065 Type 1 diabetes mellitus with hyperglycemia: Secondary | ICD-10-CM | POA: Diagnosis not present

## 2023-07-09 DIAGNOSIS — E1022 Type 1 diabetes mellitus with diabetic chronic kidney disease: Secondary | ICD-10-CM | POA: Diagnosis not present

## 2023-07-09 DIAGNOSIS — N2581 Secondary hyperparathyroidism of renal origin: Secondary | ICD-10-CM | POA: Diagnosis not present

## 2023-07-09 DIAGNOSIS — Z992 Dependence on renal dialysis: Secondary | ICD-10-CM | POA: Diagnosis not present

## 2023-07-09 DIAGNOSIS — N186 End stage renal disease: Secondary | ICD-10-CM | POA: Diagnosis not present

## 2023-07-09 DIAGNOSIS — L97922 Non-pressure chronic ulcer of unspecified part of left lower leg with fat layer exposed: Secondary | ICD-10-CM | POA: Diagnosis not present

## 2023-07-09 DIAGNOSIS — I499 Cardiac arrhythmia, unspecified: Secondary | ICD-10-CM | POA: Diagnosis not present

## 2023-07-09 DIAGNOSIS — I872 Venous insufficiency (chronic) (peripheral): Secondary | ICD-10-CM | POA: Diagnosis not present

## 2023-07-11 ENCOUNTER — Encounter: Payer: Self-pay | Admitting: Vascular Surgery

## 2023-07-11 ENCOUNTER — Encounter: Payer: Medicare HMO | Admitting: Physician Assistant

## 2023-07-11 DIAGNOSIS — N186 End stage renal disease: Secondary | ICD-10-CM | POA: Diagnosis not present

## 2023-07-11 DIAGNOSIS — I87321 Chronic venous hypertension (idiopathic) with inflammation of right lower extremity: Secondary | ICD-10-CM | POA: Diagnosis not present

## 2023-07-11 DIAGNOSIS — I890 Lymphedema, not elsewhere classified: Secondary | ICD-10-CM | POA: Diagnosis not present

## 2023-07-11 DIAGNOSIS — I87312 Chronic venous hypertension (idiopathic) with ulcer of left lower extremity: Secondary | ICD-10-CM | POA: Diagnosis not present

## 2023-07-11 DIAGNOSIS — E1122 Type 2 diabetes mellitus with diabetic chronic kidney disease: Secondary | ICD-10-CM | POA: Diagnosis not present

## 2023-07-11 DIAGNOSIS — E11622 Type 2 diabetes mellitus with other skin ulcer: Secondary | ICD-10-CM | POA: Diagnosis not present

## 2023-07-11 DIAGNOSIS — L97822 Non-pressure chronic ulcer of other part of left lower leg with fat layer exposed: Secondary | ICD-10-CM | POA: Diagnosis not present

## 2023-07-11 DIAGNOSIS — Z992 Dependence on renal dialysis: Secondary | ICD-10-CM | POA: Diagnosis not present

## 2023-07-11 NOTE — Progress Notes (Signed)
[name], [name] [id number] Page 1 of 2 Visit Report for 07/11/2023 Chief Complaint Document Details Patient Name: Date of Service: [name], [name] 07/11/2023 8:00 [name] Medical Record Number: [id number] Patient Account Number: [id number] Date of Birth/Sex: Treating RN: 1971/05/01 (52 y.o. [name]) [name] Primary Care Provider: [name] Other Clinician: Referring Provider: Treating Provider/Extender: [name] in Treatment: 20 Information Obtained from: Patient Chief Complaint Left LE Ulcer Electronic Signature(s) Signed: 07/11/2023 8:19:42 AM By: Allen Derry PA-C Entered By: Allen Derry on 07/11/2023 05:19:42 -------------------------------------------------------------------------------- Problem List Details Patient Name: Date of Service: [name], [name] 07/11/2023 8:00 [name] Medical Record Number: [id number] Patient Account Number: [id number] Date of Birth/Sex: Treating RN: 11-21-1970 (52 y.o. [name] Primary Care Provider: [name] Other Clinician: Referring Provider: Treating Provider/Extender: [name] in Treatment: 20 Active Problems ICD-10 Encounter Code Description Active Date MDM Diagnosis [phone] Non-pressure chronic ulcer of other part of left lower leg with fat 02/16/2023 No Yes layer exposed I87.312 Chronic venous hypertension (idiopathic) with ulcer of left lower 02/16/2023 No Yes extremity I89.0 Lymphedema, not elsewhere classified 03/23/2023 No Yes Shorts, [name] ([id number]) [id number] Page 2 of 2 I87.321 Chronic venous hypertension (idiopathic) with inflammation of 02/16/2023 No Yes right lower extremity E11.622 Type 2 diabetes mellitus with other skin ulcer 02/16/2023 No Yes Inactive Problems ICD-10 Code Description Active Date Inactive Date L97.522 Non-pressure chronic ulcer of other part of left foot with fat layer  03/16/2023 03/16/2023 exposed Resolved Problems Electronic Signature(s) Signed: 07/11/2023 8:19:39 AM By: Allen Derry PA-C Entered By: Allen Derry on 07/11/2023 05:19:39

## 2023-07-11 NOTE — Op Note (Signed)
OPERATIVE NOTE   PROCEDURE: Contrast injection left forearm AV access Percutaneous transluminal angioplasty peripheral segment in multiple locations using an 8 mm Dorado balloon within the graft and a 10 mm Dorado balloon in the venous outflow  PRE-OPERATIVE DIAGNOSIS: Complication of dialysis access                                                       End Stage Renal Disease  POST-OPERATIVE DIAGNOSIS: same as above   SURGEON: Renford Dills, M.D.  ANESTHESIA: Conscious sedation was administered under my direct supervision by the interventional radiology RN. IV Versed plus fentanyl were utilized. Continuous ECG, pulse oximetry and blood pressure was monitored throughout the entire procedure.  Conscious sedation was for a total of 57 minutes and 45 seconds.  ESTIMATED BLOOD LOSS: minimal  FINDING(S): Stricture of the AV graft  SPECIMEN(S):  None  CONTRAST: 35 cc  FLUOROSCOPY TIME: 3.5 minutes  INDICATIONS: [name] is a 52 y.o. male who  presents with malfunctioning left forearm AV access.  The patient is scheduled for angiography with possible intervention of the AV access.  The patient is aware the risks include but are not limited to: bleeding, infection, thrombosis of the cannulated access, and possible anaphylactic reaction to the contrast.  The patient acknowledges if the access can not be salvaged a tunneled catheter will be needed and will be placed during this procedure.  The patient is aware of the risks of the procedure and elects to proceed with the angiogram and intervention.  DESCRIPTION: After full informed written consent was obtained, the patient was brought back to the Special Procedure suite and placed supine position.  Appropriate cardiopulmonary monitors were placed.  The left was prepped and draped in the standard fashion.  Appropriate timeout is called. The left forearm AV graft was cannulated with a micropuncture needle.  Cannulation was performed with  ultrasound guidance. Ultrasound was placed in a sterile sleeve, the AV access was interrogated and noted to be echolucent and compressible indicating patency. Image was recorded for the permanent record. The puncture is performed under continuous ultrasound visualization.   The microwire was advanced and the needle was exchanged for  a microsheath.  The J-wire was then advanced and a 6 Fr sheath inserted.  Hand injections were completed to image the access from the arterial anastomosis through the entire access.  The central venous structures were also imaged by hand injections.  Interpretation: There are multiple strictures within the graft itself.  Greater than 70%.  The venous outflow at the level of the antecubital fossa region that is already been stented there are 2 discrete greater than 70% stenosis.  Venous flow of the upper left arm as well as the central venous anatomy is widely patent.  Based on the images, 4000 units of heparin was given and a wire was negotiated through the strictures within the venous portion of the graft.  An 8 mm x 60 mm Dorado balloon was used treat multiple locations within the graft itself.  Inflation was to 12 atm to 24 atm for approximately 1 a total of 3 inflations were performed.  Next a 10 mm x 40 mm Dorado balloon was utilized to angioplasty the venous outflow.  2 separate inflations were made within the previously stented segment still in the clavicular portion.  Each inflation was to 12 to 24 atm for approximately 1 minute.  A total of 2 inflations were made.  Follow-up imaging demonstrates complete resolution of the strictures (less than 5% residual stenosis) with rapid flow of contrast through the graft, the central venous anatomy is preserved.  A 4-0 Monocryl purse-string suture was sewn around the sheath.  The sheath was removed and light pressure was applied.  A sterile bandage was applied to the puncture site.    COMPLICATIONS: None  CONDITION:  [name], M.D [facility] Office: [phone]  07/11/2023 9:18 AM

## 2023-07-12 DIAGNOSIS — N186 End stage renal disease: Secondary | ICD-10-CM | POA: Diagnosis not present

## 2023-07-12 DIAGNOSIS — N2581 Secondary hyperparathyroidism of renal origin: Secondary | ICD-10-CM | POA: Diagnosis not present

## 2023-07-12 DIAGNOSIS — Z992 Dependence on renal dialysis: Secondary | ICD-10-CM | POA: Diagnosis not present

## 2023-07-13 NOTE — Progress Notes (Signed)
[name] ([id number]) [id number] Page 1 of 7 Visit Report for 07/11/2023 Arrival Information Details Patient Name: Date of Service: [name], [name] [name] 07/11/2023 8:00 [name]: [id number] Patient Account [name]: [id number] Date of Birth/Sex: Treating RN: April 08, 1971 (52 y.o. [name]) [name] [name]: [name] Other Clinician: Referring [name] Elliot Meldrum/Extender: [name] in Treatment: 20 Visit Information History Since Last Visit Added or deleted any medications: No Patient Arrived: Ambulatory Any new allergies or adverse reactions: No Arrival Time: 08:10 Had [name] fall or experienced change in No Accompanied By: girlfriend activities of daily living that may affect Transfer Assistance: None risk of falls: Patient Identification Verified: Yes Signs or symptoms of abuse/neglect since last visito No Secondary Verification Process Completed: Yes Hospitalized since last visit: No Patient Requires Transmission-Based Precautions: No Implantable device outside of the clinic excluding No Patient Has Alerts: Yes cellular tissue based products placed in the center Patient Alerts: Patient on Blood Thinner since last visit: ABI R [place] TBI .61 10/11/22 Has Dressing in Place as Prescribed: Yes ABI L [place] TBI .33 10/11/22 Has Compression in Place as Prescribed: Yes Pain Present Now: No Electronic Signature(s) Signed: 07/13/2023 4:42:48 PM By: [name] Pax RN Entered By: [name] on 07/11/2023 08:11:13 -------------------------------------------------------------------------------- Encounter Discharge Information Details Patient Name: Date of Service: [name], [name] 07/11/2023 8:00 [name]: [id number] Patient Account [name]: [id number] Date of Birth/Sex: Treating RN: Apr 05, 1971 (52 y.o. [name] [name] Care Keyleigh Manninen: [name] Other Clinician: Referring  Danely Bayliss: [name] [name]/Extender: [name] in Treatment: 20 Encounter Discharge Information Items Post Procedure Vitals Discharge Condition: Stable Temperature (F): 98.3 Ambulatory Status: Ambulatory Pulse (bpm): 87 Discharge Destination: Home Respiratory Rate (breaths/min): 18 Transportation: Private Auto Blood Pressure (mmHg): 161/90 Accompanied By: self Schedule Follow-up Appointment: Yes Clinical Summary of Care: [name] ([id number]) [id number] Page 2 of 7 Electronic Signature(s) Signed: 07/11/2023 11:28:18 AM By: [name] Pax RN Entered By: [name] on 07/11/2023 11:28:18 -------------------------------------------------------------------------------- Lower Extremity Assessment Details Patient Name: Date of Service: [name], [name] 07/11/2023 8:00 [name]: [id number] Patient Account [name]: [id number] Date of Birth/Sex: Treating RN: Dec 01, 1970 (52 y.o. [name]) [name] [name]: [name] Other Clinician: Referring [name]: [name] Kassadee Carawan/Extender: [name] in Treatment: 20 Edema Assessment Assessed: [name]: No] [name]: No] Edema: [Left: Ye] [Right: s] Calf Left: Right: Point of Measurement: 40 cm From Medial Instep 45 cm Ankle Left: Right: Point of Measurement: 10 cm From Medial Instep 40 cm Knee To Floor Left: Right: From Medial Instep 56 cm Vascular Assessment Pulses: Dorsalis Pedis Palpable: [Left:Yes] Extremity colors, hair growth, and conditions: Extremity Color: [Left:Hyperpigmented] Hair Growth on Extremity: [Left:No] Temperature of Extremity: [Left:Warm < 3 seconds] Toe Nail Assessment Left: Right: Thick: Yes Discolored: Yes Deformed: Yes Improper Length and Hygiene: Yes Electronic Signature(s) Signed: 07/13/2023 4:42:48 PM By: [name] Pax RN Entered By: [name] on 07/11/2023 08:27:51 Torgeson, [name]  ([id number]) [id number] Page 3 of 7 -------------------------------------------------------------------------------- Multi Wound Chart Details Patient Name: Date of Service: [name], [name] 07/11/2023 8:00 [name]: [id number] Patient Account [name]: [id number] Date of Birth/Sex: Treating RN: 09/02/1970 (52 y.o. [name] [name]: [name] Other Clinician: Referring [name]: [name] [name]/Extender: [name] in Treatment: 20 Vital Signs Height(in): 76 Pulse(bpm): 87 Weight(lbs): 400 Blood Pressure(mmHg): 161/90 Body Mass Index(BMI):  48.7 Temperature(F): 98.3 Respiratory Rate(breaths/min): 18 [22:Photos:] [N/[name]:N/[name]] Left Lower Leg N/[name] N/[name] Wound Location: Gradually Appeared N/[name] N/[name] Wounding Event: Diabetic Wound/Ulcer of the Lower N/[name] N/[name] [name] Etiology: Extremity Hypertension, Type II Diabetes, N/[name] N/[name] Comorbid History: Neuropathy 08/30/2022 N/[name] N/[name] Date Acquired: 20 N/[name] N/[name] Weeks of Treatment: Open N/[name] N/[name] Wound Status: No N/[name] N/[name] Wound Recurrence: 0.6x0.6x0.1 N/[name] N/[name] Measurements L x W x D (cm) 0.283 N/[name] N/[name] (cm) : rea 0.028 N/[name] N/[name] Volume (cm) : 99.70% N/[name] N/[name] % Reduction in [name] rea: 99.90% N/[name] N/[name] % Reduction in Volume: Grade 2 N/[name] N/[name] Classification: Medium N/[name] N/[name] Exudate [name] mount: Serosanguineous N/[name] N/[name] Exudate Type: red, brown N/[name] N/[name] Exudate Color: Large (67-100%) N/[name] N/[name] Granulation [name] mount: Red N/[name] N/[name] Granulation Quality: None Present (0%) N/[name] N/[name] Necrotic [name] mount: Fat Layer (Subcutaneous Tissue): Yes N/[name] N/[name] Exposed Structures: Fascia: No Tendon: No Muscle: No Joint: No Bone: No None N/[name] N/[name] Epithelialization: Treatment Notes Electronic Signature(s) Signed: 07/13/2023 4:42:48 PM By: [name] Pax RN Entered By: [name] on 07/11/2023 08:27:57 Turrubiates, Timothey [name] ([id number]) [id number] Page 4 of  7 -------------------------------------------------------------------------------- Multi-Disciplinary Care Plan Details Patient Name: Date of Service: [name], [name] 07/11/2023 8:00 [name]: [id number] Patient Account [name]: [id number] Date of Birth/Sex: Treating RN: 10/24/1970 (52 y.o. [name]) [name] [name]: [name] Other Clinician: Referring Kathyleen Radice: [name] Clarine Elrod/Extender: [name] in Treatment: 20 Active Inactive Wound/Skin Impairment Nursing Diagnoses: Knowledge deficit related to ulceration/compromised skin integrity Goals: Patient/caregiver will verbalize understanding of skin care regimen Date Initiated: 03/09/2023 Date Inactivated: 04/13/2023 Target Resolution Date: 04/09/2023 Goal Status: Met Ulcer/skin breakdown will have [name] volume reduction of 30% by week 4 Date Initiated: 03/09/2023 Date Inactivated: 04/13/2023 Target Resolution Date: 04/09/2023 Goal Status: Met Ulcer/skin breakdown will have [name] volume reduction of 50% by week 8 Date Initiated: 03/09/2023 Date Inactivated: 05/18/2023 Target Resolution Date: 05/10/2023 Goal Status: Met Ulcer/skin breakdown will have [name] volume reduction of 80% by week 12 Date Initiated: 03/09/2023 Date Inactivated: 06/29/2023 Target Resolution Date: 06/09/2023 Goal Status: Met Ulcer/skin breakdown will heal within 14 weeks Date Initiated: 03/09/2023 Target Resolution Date: 07/10/2023 Goal Status: Active Interventions: Assess patient/caregiver ability to obtain necessary supplies Assess patient/caregiver ability to perform ulcer/skin care regimen upon admission and as needed Assess ulceration(s) every visit Notes: Electronic Signature(s) Signed: 07/13/2023 4:42:48 PM By: [name] Pax RN Entered By: [name] on 07/11/2023 08:28:13 -------------------------------------------------------------------------------- Pain Assessment Details Patient Name: Date of  Service: [name], [name] 07/11/2023 8:00 [name]: [id number] Patient Account [name]: [id number] [place], WALLA [name] ([id number]) [id number] Page 5 of 7 Date of Birth/Sex: Treating RN: 1971-02-01 (52 y.o. [name]) [name] [name]: Other Clinician: Maudie [name] Referring Cailan General: [name] Robert Sunga/Extender: [name] in Treatment: 20 Active Problems Location of Pain Severity and Description of Pain Patient Has Paino No Site Locations Pain Management and Medication Current Pain Management: Electronic Signature(s) Signed: 07/13/2023 4:42:48 PM By: [name] Pax RN Entered By: [name] on 07/11/2023 08:15:50 -------------------------------------------------------------------------------- Patient/Caregiver Education Details Patient Name: Date of Service: [name], [name] 11/11/2024andnbsp8:00 [name]: [id number] Patient Account [name]: [id number] Date of Birth/Gender: Treating RN: 11/16/1970 (52 y.o. [name] [name] Care Physician: [name] Other Clinician: Referring Physician: Treating Physician/Extender: [name] in Treatment: 20 Education Assessment Education Provided To: Patient Education Topics Provided Wound/Skin Impairment: Handouts: Caring for Your Ulcer  Methods: Explain/Verbal Responses: State content correctly Electronic Signature(s) Donovan, Murrell [name] ([id number]) [id number] Page 6 of 7 Signed: 07/13/2023 4:42:48 PM By: [name] Pax RN Entered By: [name] on 07/11/2023 30:86:57 -------------------------------------------------------------------------------- Wound Assessment Details Patient Name: Date of Service: [name], [name] 07/11/2023 8:00 [name]: [id number] Patient Account [name]: [id number] Date of Birth/Sex: Treating RN: 23-Jan-1971 (52 y.o. [name]) [name] [name]  Care Casidy [name]: [name] Other Clinician: Referring Shayden Gingrich: [name] Nisha Dhami/Extender: [name] in Treatment: 20 Wound Status Wound [name]: 22 [name] Etiology: Diabetic Wound/Ulcer of the Lower Extremity Wound Location: Left Lower Leg Wound Status: Open Wounding Event: Gradually Appeared Comorbid History: Hypertension, Type II Diabetes, Neuropathy Date Acquired: 08/30/2022 Weeks Of Treatment: 20 Clustered Wound: No Photos Wound Measurements Length: (cm) 0.6 Width: (cm) 0.6 Depth: (cm) 0.1 Area: (cm) 0.283 Volume: (cm) 0.028 % Reduction in Area: 99.7% % Reduction in Volume: 99.9% Epithelialization: None Tunneling: No Undermining: No Wound Description Classification: Grade 2 Exudate Amount: Medium Exudate Type: Serosanguineous Exudate Color: red, brown Foul Odor After Cleansing: No Slough/Fibrino Yes Wound Bed Granulation Amount: Large (67-100%) Exposed Structure Granulation Quality: Red Fascia Exposed: No Necrotic Amount: None Present (0%) Fat Layer (Subcutaneous Tissue) Exposed: Yes Tendon Exposed: No Muscle Exposed: No Joint Exposed: No Bone Exposed: No Treatment Notes Wound #22 (Lower Leg) Wound Laterality: Left [place], Latwan [name] ([id number]) [id number] Page 7 of 7 Cleanser Soap and Water Discharge Instruction: Gently cleanse wound with antibacterial soap, rinse and pat dry prior to dressing wounds Wound Cleanser Discharge Instruction: Wash your hands with soap and water. Remove old dressing, discard into plastic bag and place into trash. Cleanse the wound with Wound Cleanser prior to applying [name] clean dressing using gauze sponges, not tissues or cotton balls. Do not scrub or use excessive force. Pat dry using gauze sponges, not tissue or cotton balls. Peri-Wound Care AandD Ointment Discharge Instruction: lower leg Topical [name] Dressing Promogran Matrix 4.34 (in) Discharge Instruction: Moisten  w/normal saline or sterile water; Cover wound as directed. Do not remove from wound bed. Secondary Dressing ABD Pad 5x9 (in/in) Discharge Instruction: Cover with ABD pad oil emulsion Discharge Instruction: over promogran Secured With Tubigrip Size D, 3x10 (in/yd) Compression Wrap Compression Stockings Add-Ons Electronic Signature(s) Signed: 07/13/2023 4:42:48 PM By: [name] Pax RN Entered By: [name] on 07/11/2023 08:26:54 -------------------------------------------------------------------------------- Vitals Details Patient Name: Date of Service: [name], [name] 07/11/2023 8:00 [name]: [id number] Patient Account [name]: [id number] Date of Birth/Sex: Treating RN: 1971/05/09 (52 y.o. [name]) [name] [name]: [name] Other Clinician: Referring Gabrielle Mester: [name] Elek Holderness/Extender: [name] in Treatment: 20 Vital Signs Time Taken: 08:11 Temperature (F): 98.3 Height (in): 76 Pulse (bpm): 87 Weight (lbs): 400 Respiratory Rate (breaths/min): 18 Body Mass Index (BMI): 48.7 Blood Pressure (mmHg): 161/90 Reference Range: 80 - 120 mg / dl Electronic Signature(s) Signed: 07/13/2023 4:42:48 PM By: [name] Pax RN Entered By: [name] on 07/11/2023 08:15:35

## 2023-07-14 DIAGNOSIS — N2581 Secondary hyperparathyroidism of renal origin: Secondary | ICD-10-CM | POA: Diagnosis not present

## 2023-07-14 DIAGNOSIS — N186 End stage renal disease: Secondary | ICD-10-CM | POA: Diagnosis not present

## 2023-07-14 DIAGNOSIS — Z992 Dependence on renal dialysis: Secondary | ICD-10-CM | POA: Diagnosis not present

## 2023-07-15 DIAGNOSIS — L97922 Non-pressure chronic ulcer of unspecified part of left lower leg with fat layer exposed: Secondary | ICD-10-CM | POA: Diagnosis not present

## 2023-07-15 DIAGNOSIS — E1065 Type 1 diabetes mellitus with hyperglycemia: Secondary | ICD-10-CM | POA: Diagnosis not present

## 2023-07-15 DIAGNOSIS — I120 Hypertensive chronic kidney disease with stage 5 chronic kidney disease or end stage renal disease: Secondary | ICD-10-CM | POA: Diagnosis not present

## 2023-07-15 DIAGNOSIS — N186 End stage renal disease: Secondary | ICD-10-CM | POA: Diagnosis not present

## 2023-07-15 DIAGNOSIS — I890 Lymphedema, not elsewhere classified: Secondary | ICD-10-CM | POA: Diagnosis not present

## 2023-07-15 DIAGNOSIS — I872 Venous insufficiency (chronic) (peripheral): Secondary | ICD-10-CM | POA: Diagnosis not present

## 2023-07-15 DIAGNOSIS — E1051 Type 1 diabetes mellitus with diabetic peripheral angiopathy without gangrene: Secondary | ICD-10-CM | POA: Diagnosis not present

## 2023-07-15 DIAGNOSIS — I499 Cardiac arrhythmia, unspecified: Secondary | ICD-10-CM | POA: Diagnosis not present

## 2023-07-15 DIAGNOSIS — E1022 Type 1 diabetes mellitus with diabetic chronic kidney disease: Secondary | ICD-10-CM | POA: Diagnosis not present

## 2023-07-16 DIAGNOSIS — Z992 Dependence on renal dialysis: Secondary | ICD-10-CM | POA: Diagnosis not present

## 2023-07-16 DIAGNOSIS — N186 End stage renal disease: Secondary | ICD-10-CM | POA: Diagnosis not present

## 2023-07-16 DIAGNOSIS — N2581 Secondary hyperparathyroidism of renal origin: Secondary | ICD-10-CM | POA: Diagnosis not present

## 2023-07-19 DIAGNOSIS — N186 End stage renal disease: Secondary | ICD-10-CM | POA: Diagnosis not present

## 2023-07-19 DIAGNOSIS — N2581 Secondary hyperparathyroidism of renal origin: Secondary | ICD-10-CM | POA: Diagnosis not present

## 2023-07-19 DIAGNOSIS — Z992 Dependence on renal dialysis: Secondary | ICD-10-CM | POA: Diagnosis not present

## 2023-07-20 ENCOUNTER — Encounter: Payer: Medicare HMO | Admitting: Physician Assistant

## 2023-07-20 DIAGNOSIS — I87321 Chronic venous hypertension (idiopathic) with inflammation of right lower extremity: Secondary | ICD-10-CM | POA: Diagnosis not present

## 2023-07-20 DIAGNOSIS — E1122 Type 2 diabetes mellitus with diabetic chronic kidney disease: Secondary | ICD-10-CM | POA: Diagnosis not present

## 2023-07-20 DIAGNOSIS — I87312 Chronic venous hypertension (idiopathic) with ulcer of left lower extremity: Secondary | ICD-10-CM | POA: Diagnosis not present

## 2023-07-20 DIAGNOSIS — Z992 Dependence on renal dialysis: Secondary | ICD-10-CM | POA: Diagnosis not present

## 2023-07-20 DIAGNOSIS — L97822 Non-pressure chronic ulcer of other part of left lower leg with fat layer exposed: Secondary | ICD-10-CM | POA: Diagnosis not present

## 2023-07-20 DIAGNOSIS — I890 Lymphedema, not elsewhere classified: Secondary | ICD-10-CM | POA: Diagnosis not present

## 2023-07-20 DIAGNOSIS — N186 End stage renal disease: Secondary | ICD-10-CM | POA: Diagnosis not present

## 2023-07-20 DIAGNOSIS — E11622 Type 2 diabetes mellitus with other skin ulcer: Secondary | ICD-10-CM | POA: Diagnosis not present

## 2023-07-20 NOTE — Progress Notes (Addendum)
[name], [name] ([id number]) [id number] Page 1 of 7 Visit Report for 07/20/2023 Arrival Information Details Patient Name: Date of Service: [name] 07/20/2023 8:00 [name] Medical Record Number: [id number] Patient Account Number: [id number] Date of Birth/Sex: Treating RN: 1971/03/01 (52 y.o. [name]) [name] Primary Care Makarios Madlock: [name] Other Clinician: Referring Prabhleen Montemayor: Treating Suhail Peloquin/Extender: [name] in Treatment: 22 Visit Information History Since Last Visit Added or deleted any medications: No Patient Arrived: Ambulatory Any new allergies or adverse reactions: No Arrival Time: 08:05 Had [name] fall or experienced change in No Accompanied By: self activities of daily living that may affect Transfer Assistance: None risk of falls: Patient Identification Verified: Yes Signs or symptoms of abuse/neglect since last visito No Secondary Verification Process Completed: Yes Hospitalized since last visit: No Patient Requires Transmission-Based Precautions: No Implantable device outside of the clinic excluding No Patient Has Alerts: Yes cellular tissue based products placed in the center Patient Alerts: Patient on Blood Thinner since last visit: ABI R [place] TBI .61 10/11/22 Has Dressing in Place as Prescribed: Yes ABI L [place] TBI .33 10/11/22 Has Compression in Place as Prescribed: Yes Pain Present Now: No Electronic Signature(s) Signed: 07/20/2023 4:41:11 PM By: [name] RN Entered By: [name] on 07/20/2023 05:05:27 -------------------------------------------------------------------------------- Lower Extremity Assessment Details Patient Name: Date of Service: [name] 07/20/2023 8:00 [name] Medical Record Number: [id number] Patient Account Number: [id number] Date of Birth/Sex: Treating RN: November 23, 1970 (52 y.o. [name]: [name] Other Clinician: Referring  Grantham Hippert: Treating Oluwatosin Higginson/Extender: [name] in Treatment: 22 Edema Assessment Assessed: Kyra Searles: No] [Right: No] Edema: [Left: Ye] [Right: s] Calf Left: Right: Point of Measurement: 40 cm From Medial Instep 49 cm [name] ([id number]) [id number] Page 2 of 7 Ankle Left: Right: Point of Measurement: 10 cm From Medial Instep 42 cm Knee To Floor Left: Right: From Medial Instep 56 cm Vascular Assessment Pulses: Dorsalis Pedis Palpable: [Left:Yes] Extremity colors, hair growth, and conditions: Extremity Color: [Left:Hyperpigmented] Hair Growth on Extremity: [Left:No] Temperature of Extremity: [Left:Warm] Capillary Refill: [Left:< 3 seconds] Dependent Rubor: [Left:No] Blanched when Elevated: [Left:No Yes] Toe Nail Assessment Left: Right: Thick: Yes Discolored: Yes Deformed: Yes Improper Length and Hygiene: Yes Electronic Signature(s) Signed: 07/20/2023 4:41:11 PM By: [name] RN Entered By: [name] on 07/20/2023 05:14:50 -------------------------------------------------------------------------------- Multi Wound Chart Details Patient Name: Date of Service: [name] 07/20/2023 8:00 [name] Medical Record Number: [id number] Patient Account Number: [id number] Date of Birth/Sex: Treating RN: 1971-04-22 (52 y.o. [name]: [name] Other Clinician: Referring Riccardo Holeman: Treating Amariss Detamore/Extender: [name] in Treatment: 22 Vital Signs Height(in): 76 Pulse(bpm): 79 Weight(lbs): 400 Blood Pressure(mmHg): 155/94 Body Mass Index(BMI): 48.7 Temperature(F): 98.2 Respiratory Rate(breaths/min): 18 [22:Photos:] [[name]:[name] [id number] Page 3 of 7] Left Lower Leg Left, Medial Lower Leg [name] Wound Location: Gradually Appeared Trauma [name] Wounding Event: Diabetic Wound/Ulcer of the Lower Diabetic Wound/Ulcer of the Lower  [name] Primary Etiology: Extremity Extremity Hypertension, Type II Diabetes, Hypertension, Type II Diabetes, [name] Comorbid History: Neuropathy Neuropathy 08/30/2022 07/17/2023 [name] Date Acquired: 22 0 [name] Weeks of Treatment: Open Open [name] Wound Status: No No [name] Wound Recurrence: 0.2x0.2x0.1 1.7x2.5x0.1 [name] Measurements L x W x D (cm) 0.031 3.338 [name] (cm) : rea 0.003 0.334 [name] Volume (cm) : 100.00% [name] [name] % Reduction in [name] rea: 100.00% [name] [name] % Reduction in Volume: Grade 2  Grade 1 [name] Classification: Medium Medium [name] Exudate [name] mount: Serosanguineous [name] Exudate Type: red, brown red, brown [name] Exudate Color: Large (67-100%) Medium (34-66%) [name] Granulation [name] mount: Red Red [name] Granulation Quality: None Present (0%) Medium (34-66%) [name] Necrotic [name] mount: Fat Layer (Subcutaneous Tissue): Yes Fat Layer (Subcutaneous Tissue): Yes [name] Exposed Structures: Fascia: No Fascia: No Tendon: No Tendon: No Muscle: No Muscle: No Joint: No Joint: No Bone: No Bone: No None None [name] Epithelialization: Treatment Notes Electronic Signature(s) Signed: 07/20/2023 4:41:11 PM By: [name] RN Entered By: [name] on 07/20/2023 05:14:55 -------------------------------------------------------------------------------- Multi-Disciplinary Care Plan Details Patient Name: Date of Service: [name] 07/20/2023 8:00 [name] Medical Record Number: [id number] Patient Account Number: [id number] Date of Birth/Sex: Treating RN: 1971/05/08 (52 y.o. [name]: [name] Other Clinician: Referring Marlan Steward: Treating Monserratt Knezevic/Extender: [name] in Treatment: 22 Active Inactive Electronic Signature(s) Signed: 07/20/2023 4:41:11 PM By: [name] RN Entered By: [name] on 07/20/2023 05:15:11 [name], [name] ([id number]) [id number] Page 4 of  7 -------------------------------------------------------------------------------- Pain Assessment Details Patient Name: Date of Service: [name] 07/20/2023 8:00 [name] Medical Record Number: [id number] Patient Account Number: [id number] Date of Birth/Sex: Treating RN: 21-Jul-1971 (52 y.o. [name]) [name] Primary Care Amarie Viles: [name] Other Clinician: Referring Yaman Grauberger: Treating Azha Constantin/Extender: [name] in Treatment: 22 Active Problems Location of Pain Severity and Description of Pain Patient Has Paino No Site Locations Pain Management and Medication Current Pain Management: Electronic Signature(s) Signed: 07/20/2023 4:41:11 PM By: [name] RN Entered By: [name] on 07/20/2023 05:05:55 -------------------------------------------------------------------------------- Patient/Caregiver Education Details Patient Name: Date of Service: [name] 11/20/2024andnbsp8:00 [name] Medical Record Number: [id number] Patient Account Number: [id number] Date of Birth/Gender: Treating RN: Jul 15, 1971 (52 y.o. [name] Primary Care Physician: [name] Other Clinician: Referring Physician: Treating Physician/Extender: [name] in Treatment: [address] Wai [name] ([id number]) [id number] Page 5 of 7 Education Assessment Education Provided To: Patient Education Topics Provided Wound/Skin Impairment: Handouts: Caring for Your Ulcer Methods: Explain/Verbal Responses: State content correctly Electronic Signature(s) Signed: 07/20/2023 4:41:11 PM By: [name] RN Entered By: [name] on 07/20/2023 05:15:25 -------------------------------------------------------------------------------- Wound Assessment Details Patient Name: Date of Service: [name] 07/20/2023 8:00 [name] Medical Record Number: [id number] Patient Account Number: [id number] Date of Birth/Sex:  Treating RN: 1971-07-17 (52 y.o. [name]) [name] Primary Care Shahin Knierim: [name] Other Clinician: Referring Shaun Runyon: Treating Brandilee Pies/Extender: [name] in Treatment: 22 Wound Status Wound Number: 22 Primary Etiology: Diabetic Wound/Ulcer of the Lower Extremity Wound Location: Left Lower Leg Wound Status: Open Wounding Event: Gradually Appeared Comorbid History: Hypertension, Type II Diabetes, Neuropathy Date Acquired: 08/30/2022 Weeks Of Treatment: 22 Clustered Wound: No Photos Wound Measurements Length: (cm) 0.2 Width: (cm) 0.2 Depth: (cm) 0.1 Area: (cm) 0.031 Volume: (cm) 0.003 % Reduction in Area: 100% % Reduction in Volume: 100% Epithelialization: None Tunneling: No Undermining: No Wound Description Classification: Grade 2 Exudate Amount: Medium Exudate Type: [name], [name] ([id number]) Exudate Color: red, brown Foul Odor After Cleansing: No Slough/Fibrino Yes [id number] Page 6 of 7 Wound Bed Granulation Amount: Large (67-100%) Exposed Structure Granulation Quality: Red Fascia Exposed: No Necrotic Amount: None Present (0%) Fat Layer (Subcutaneous Tissue) Exposed: Yes Tendon Exposed: No Muscle Exposed: No Joint Exposed: No Bone Exposed: No Electronic Signature(s) Signed: 07/20/2023 4:41:11 PM By: [name] RN  Entered By: [name] on 07/20/2023 05:13:56 -------------------------------------------------------------------------------- Wound Assessment Details Patient Name: Date of Service: [name] 07/20/2023 8:00 [name] Medical Record Number: [id number] Patient Account Number: [id number] Date of Birth/Sex: Treating RN: [birth year] (52 y.o. [name]) [name] Primary Care Onie Hayashi: [name] Other Clinician: Referring Jaeshaun Riva: Treating Naveah Brave/Extender: [name] in Treatment: 22 Wound Status Wound Number: 24 Primary Etiology: Diabetic  Wound/Ulcer of the Lower Extremity Wound Location: Left, Medial Lower Leg Wound Status: Open Wounding Event: Trauma Comorbid History: Hypertension, Type II Diabetes, Neuropathy Date Acquired: 07/17/2023 Weeks Of Treatment: 0 Clustered Wound: No Photos Wound Measurements Length: (cm) 1.7 Width: (cm) 2.5 Depth: (cm) 0.1 Area: (cm) 3.338 Volume: (cm) 0.334 % Reduction in Area: % Reduction in Volume: Epithelialization: None Tunneling: No Undermining: No Wound Description Classification: Grade 1 Exudate Amount: Medium Exudate Type: Serosanguineous Exudate Color: red, brown [name], [name] ([id number]) Wound Bed Granulation Amount: Medium (34-66%) Granulation Quality: Red Necrotic Amount: Medium (34-66%) Necrotic Quality: Adherent Slough Foul Odor After Cleansing: No Slough/Fibrino Yes [phone] [id number].pdf Page 7 of 7 Exposed Structure Fascia Exposed: No Fat Layer (Subcutaneous Tissue) Exposed: Yes Tendon Exposed: No Muscle Exposed: No Joint Exposed: No Bone Exposed: No Electronic Signature(s) Signed: 07/20/2023 4:41:11 PM By: [name] RN Entered By: [name] on 07/20/2023 05:13:18 -------------------------------------------------------------------------------- Vitals Details Patient Name: Date of Service: [name] 07/20/2023 8:00 [name] Medical Record Number: [id number] Patient Account Number: [id number] Date of Birth/Sex: Treating RN: [birth year] (52 y.o. [name]) [name] Primary Care Belky Mundo: [name] Other Clinician: Referring Araya Roel: Treating Keysi Oelkers/Extender: [name] in Treatment: 22 Vital Signs Time Taken: 08:05 Temperature (F): 98.2 Height (in): 76 Pulse (bpm): 79 Weight (lbs): 400 Respiratory Rate (breaths/min): 18 Body Mass Index (BMI): 48.7 Blood Pressure (mmHg): 155/94 Reference Range: 80 - 120 mg / dl Electronic Signature(s) Signed: 07/20/2023 4:41:11 PM By: [name]  RN Entered By: [name] on 07/20/2023 05:05:49

## 2023-07-20 NOTE — Progress Notes (Signed)
WESTLEY, SIMONELLI [name] ([id number]) [id number] Page 1 of 2 Visit Report for 07/20/2023 Chief Complaint Document Details Patient Name: Date of Service: [name], [name] 07/20/2023 8:00 [name] Medical Record Number: [id number] Patient Account Number: [id number] Date of Birth/Sex: Treating RN: 21-Feb-1971 (52 y.o. [name]) [name] Primary Care Provider: [name] Other Clinician: Referring Provider: Treating Provider/Extender: [name] in Treatment: 22 Information Obtained from: Patient Chief Complaint Left LE Ulcer Electronic Signature(s) Signed: 07/20/2023 7:57:55 AM By: Allen Derry PA-C Entered By: Allen Derry on 07/20/2023 07:57:55 -------------------------------------------------------------------------------- Problem List Details Patient Name: Date of Service: [name], [name] 07/20/2023 8:00 [name] Medical Record Number: [id number] Patient Account Number: [id number] Date of Birth/Sex: Treating RN: Dec 18, 1970 (52 y.o. [name] Primary Care Provider: [name] Other Clinician: Referring Provider: Treating Provider/Extender: [name] in Treatment: 22 Active Problems ICD-10 Encounter Code Description Active Date MDM Diagnosis [phone] Non-pressure chronic ulcer of other part of left lower leg with fat 02/16/2023 No Yes layer exposed I87.312 Chronic venous hypertension (idiopathic) with ulcer of left lower 02/16/2023 No Yes extremity I89.0 Lymphedema, not elsewhere classified 03/23/2023 No Yes Sirmon, [name] ([id number]) [id number] Page 2 of 2 I87.321 Chronic venous hypertension (idiopathic) with inflammation of 02/16/2023 No Yes right lower extremity E11.622 Type 2 diabetes mellitus with other skin ulcer 02/16/2023 No Yes Inactive Problems ICD-10 Code Description Active Date Inactive Date L97.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

## 2023-07-21 DIAGNOSIS — N186 End stage renal disease: Secondary | ICD-10-CM | POA: Diagnosis not present

## 2023-07-21 DIAGNOSIS — Z992 Dependence on renal dialysis: Secondary | ICD-10-CM | POA: Diagnosis not present

## 2023-07-21 DIAGNOSIS — N2581 Secondary hyperparathyroidism of renal origin: Secondary | ICD-10-CM | POA: Diagnosis not present

## 2023-07-22 DIAGNOSIS — L97922 Non-pressure chronic ulcer of unspecified part of left lower leg with fat layer exposed: Secondary | ICD-10-CM | POA: Diagnosis not present

## 2023-07-22 DIAGNOSIS — E1065 Type 1 diabetes mellitus with hyperglycemia: Secondary | ICD-10-CM | POA: Diagnosis not present

## 2023-07-22 DIAGNOSIS — I120 Hypertensive chronic kidney disease with stage 5 chronic kidney disease or end stage renal disease: Secondary | ICD-10-CM | POA: Diagnosis not present

## 2023-07-22 DIAGNOSIS — I872 Venous insufficiency (chronic) (peripheral): Secondary | ICD-10-CM | POA: Diagnosis not present

## 2023-07-22 DIAGNOSIS — E1051 Type 1 diabetes mellitus with diabetic peripheral angiopathy without gangrene: Secondary | ICD-10-CM | POA: Diagnosis not present

## 2023-07-22 DIAGNOSIS — I499 Cardiac arrhythmia, unspecified: Secondary | ICD-10-CM | POA: Diagnosis not present

## 2023-07-22 DIAGNOSIS — N186 End stage renal disease: Secondary | ICD-10-CM | POA: Diagnosis not present

## 2023-07-22 DIAGNOSIS — I890 Lymphedema, not elsewhere classified: Secondary | ICD-10-CM | POA: Diagnosis not present

## 2023-07-22 DIAGNOSIS — E1022 Type 1 diabetes mellitus with diabetic chronic kidney disease: Secondary | ICD-10-CM | POA: Diagnosis not present

## 2023-07-23 DIAGNOSIS — N2581 Secondary hyperparathyroidism of renal origin: Secondary | ICD-10-CM | POA: Diagnosis not present

## 2023-07-23 DIAGNOSIS — N186 End stage renal disease: Secondary | ICD-10-CM | POA: Diagnosis not present

## 2023-07-23 DIAGNOSIS — Z992 Dependence on renal dialysis: Secondary | ICD-10-CM | POA: Diagnosis not present

## 2023-07-25 ENCOUNTER — Ambulatory Visit: Payer: Self-pay | Admitting: *Deleted

## 2023-07-25 ENCOUNTER — Other Ambulatory Visit (INDEPENDENT_AMBULATORY_CARE_PROVIDER_SITE_OTHER): Payer: Self-pay | Admitting: Vascular Surgery

## 2023-07-25 DIAGNOSIS — N186 End stage renal disease: Secondary | ICD-10-CM

## 2023-07-25 NOTE — Patient Outreach (Addendum)
  Care Coordination   07/25/2023 Name: [name] MRN: [id number] DOB: [birth year]   Care Coordination Outreach Attempts:  An unsuccessful telephone outreach was attempted for a scheduled appointment today.   2 attempts made, voice message left.   Follow Up Plan:  Additional outreach attempts will be made to offer the patient care coordination information and services.   Encounter Outcome:  No Answer   Care Coordination Interventions:  No, not indicated    Rodney Langton, RN, MSN, CCM [place]  [facility], [facility] RN Care Coordinator Direct Dial: [phone] / Main [phone] Fax [phone] Email: [email or web address]: [email or web address]

## 2023-07-26 DIAGNOSIS — Z992 Dependence on renal dialysis: Secondary | ICD-10-CM | POA: Diagnosis not present

## 2023-07-26 DIAGNOSIS — N2581 Secondary hyperparathyroidism of renal origin: Secondary | ICD-10-CM | POA: Diagnosis not present

## 2023-07-26 DIAGNOSIS — N186 End stage renal disease: Secondary | ICD-10-CM | POA: Diagnosis not present

## 2023-07-26 DIAGNOSIS — Z79899 Other long term (current) drug therapy: Secondary | ICD-10-CM | POA: Diagnosis not present

## 2023-07-27 ENCOUNTER — Encounter: Payer: Medicare HMO | Admitting: Physician Assistant

## 2023-07-27 DIAGNOSIS — N186 End stage renal disease: Secondary | ICD-10-CM | POA: Diagnosis not present

## 2023-07-27 DIAGNOSIS — E1122 Type 2 diabetes mellitus with diabetic chronic kidney disease: Secondary | ICD-10-CM | POA: Diagnosis not present

## 2023-07-27 DIAGNOSIS — I87321 Chronic venous hypertension (idiopathic) with inflammation of right lower extremity: Secondary | ICD-10-CM | POA: Diagnosis not present

## 2023-07-27 DIAGNOSIS — I890 Lymphedema, not elsewhere classified: Secondary | ICD-10-CM | POA: Diagnosis not present

## 2023-07-27 DIAGNOSIS — Z992 Dependence on renal dialysis: Secondary | ICD-10-CM | POA: Diagnosis not present

## 2023-07-27 DIAGNOSIS — E11622 Type 2 diabetes mellitus with other skin ulcer: Secondary | ICD-10-CM | POA: Diagnosis not present

## 2023-07-27 DIAGNOSIS — L97822 Non-pressure chronic ulcer of other part of left lower leg with fat layer exposed: Secondary | ICD-10-CM | POA: Diagnosis not present

## 2023-07-27 DIAGNOSIS — I87312 Chronic venous hypertension (idiopathic) with ulcer of left lower extremity: Secondary | ICD-10-CM | POA: Diagnosis not present

## 2023-07-27 DIAGNOSIS — N2581 Secondary hyperparathyroidism of renal origin: Secondary | ICD-10-CM | POA: Diagnosis not present

## 2023-07-27 NOTE — Progress Notes (Signed)
[name] [name], [name] [name] ([id number]) [id number] Page 1 of 9 Visit Report for 07/27/2023 Arrival Information Details Patient Name: Date of Service: [name] LT, CO [place] W [name] 07/27/2023 11:15 [name] [name] Medical Record Number: [id number] Patient Account Number: [id number] Date of Birth/Sex: Treating RN: June 16, 1971 (52 y.o. [name]) [name] Primary Care Hamid Brookens: Maudie [name] Other Clinician: Referring Maaliyah Adolph: Treating Peri Kreft/Extender: [name] in Treatment: 23 Visit Information History Since Last Visit Added or deleted any medications: No Patient Arrived: Ambulatory Any new allergies or adverse reactions: No Arrival Time: 10:41 Had [name] fall or experienced change in No Accompanied By: self activities of daily living that may affect Transfer Assistance: None risk of falls: Patient Identification Verified: Yes Signs or symptoms of abuse/neglect since last visito No Secondary Verification Process Completed: Yes Hospitalized since last visit: No Patient Requires Transmission-Based Precautions: No Implantable device outside of the clinic excluding No Patient Has Alerts: Yes cellular tissue based products placed in the center Patient Alerts: Patient on Blood Thinner since last visit: ABI R [place] TBI .61 10/11/22 Has Dressing in Place as Prescribed: Yes ABI L [place] TBI .33 10/11/22 Has Compression in Place as Prescribed: Yes Pain Present Now: No Electronic Signature(s) Signed: 07/27/2023 11:41:37 AM By: [name] RN Entered By: [name] on 07/27/2023 10:42:02 -------------------------------------------------------------------------------- Clinic Level of Care Assessment Details Patient Name: Date of Service: [name] LT, CO SLO W [name] 07/27/2023 11:15 [name] [name] Medical Record Number: [id number] Patient Account Number: [id number] Date of Birth/Sex: Treating RN: 04/19/1971 (52 y.o. [name] [name] Other Clinician: Referring  Mouhamed Glassco: Treating Jaquayla Hege/Extender: [name] in Treatment: 23 Clinic Level of Care Assessment Items TOOL 1 Quantity Score []  - 0 Use when EandM and Procedure is performed on INITIAL visit ASSESSMENTS - Nursing Assessment / Reassessment []  - 0 General Physical Exam (combine w/ comprehensive assessment (listed just below) when performed on new pt. evals) []  - 0 Comprehensive Assessment (HX, ROS, Risk Assessments, Wounds Hx, etc.) [name] [name] ([id number]) [id number] Page 2 of 9 ASSESSMENTS - Wound and Skin Assessment / Reassessment []  - 0 Dermatologic / Skin Assessment (not related to wound area) ASSESSMENTS - Ostomy and/or Continence Assessment and Care []  - 0 Incontinence Assessment and Management []  - 0 Ostomy Care Assessment and Management (repouching, etc.) PROCESS - Coordination of Care []  - 0 Simple Patient / Family Education for ongoing care []  - 0 Complex (extensive) Patient / Family Education for ongoing care []  - 0 Staff obtains Chiropractor, Records, T Results / Process Orders est []  - 0 Staff telephones HHA, Nursing Homes / Clarify orders / etc []  - 0 Routine Transfer to another Facility (non-emergent condition) []  - 0 Routine Hospital Admission (non-emergent condition) []  - 0 New Admissions / Manufacturing engineer / Ordering NPWT Apligraf, etc. , []  - 0 Emergency Hospital Admission (emergent condition) PROCESS - Special Needs []  - 0 Pediatric / Minor Patient Management []  - 0 Isolation Patient Management []  - 0 Hearing / Language / Visual special needs []  - 0 Assessment of Community assistance (transportation, D/C planning, etc.) []  - 0 Additional assistance / Altered mentation []  - 0 Support Surface(s) Assessment (bed, cushion, seat, etc.) INTERVENTIONS - Miscellaneous []  - 0 External ear exam []  - 0 Patient Transfer (multiple staff / Nurse, adult / Similar devices) []  - 0 Simple Staple /  Suture removal (25 or less) []  - 0 Complex Staple / Suture removal (26 or more) []  - 0 Hypo/Hyperglycemic Management (do not  check if billed separately) []  - 0 Ankle / Brachial Index (ABI) - do not check if billed separately Has the patient been seen at the hospital within the last three years: Yes Total Score: 0 Level Of Care: ____ Electronic Signature(s) Signed: 07/27/2023 11:41:37 AM By: [name] RN Entered By: [name] on 07/27/2023 11:34:59 -------------------------------------------------------------------------------- Encounter Discharge Information Details Patient Name: Date of Service: [name] LT, CO SLO W [name] 07/27/2023 11:15 [name] [name] Medical Record Number: [id number] Patient Account Number: [id number] Date of Birth/Sex: Treating RN: 10/19/1970 (52 y.o. [name] [name] Primary Care Jazzelle Zhang: Maudie [name] Other Clinician: Referring Mayana Irigoyen: Treating Cyanna Neace/Extender: Nicolette Bang [name] ([id number]) [id number].pdf Page 3 of 9 Weeks in Treatment: 23 Encounter Discharge Information Items Post Procedure Vitals Discharge Condition: Stable Temperature (F): 98.5 Ambulatory Status: Ambulatory Pulse (bpm): 72 Discharge Destination: Home Respiratory Rate (breaths/min): 18 Transportation: Private Auto Blood Pressure (mmHg): 135/78 Accompanied By: self Schedule Follow-up Appointment: Yes Clinical Summary of Care: Electronic Signature(s) Signed: 07/27/2023 11:36:19 AM By: [name] RN Entered By: [name] on 07/27/2023 11:36:19 -------------------------------------------------------------------------------- Lower Extremity Assessment Details Patient Name: Date of Service: [name] LT, CO SLO W [name] 07/27/2023 11:15 [name] [name] Medical Record Number: [id number] Patient Account Number: [id number] Date of Birth/Sex: Treating RN: 1971-06-27 (52 y.o. [name] [name] Primary Care Bergen Magner: Maudie [name] Other Clinician: Referring  Ethel Meisenheimer: Treating Treylon Henard/Extender: [name] in Treatment: 23 Edema Assessment Assessed: Kyra Searles: No] Franne Forts: No] Edema: [Left: Ye] [Right: s] Calf Left: Right: Point of Measurement: 40 cm From Medial Instep 49 cm Ankle Left: Right: Point of Measurement: 10 cm From Medial Instep 42 cm Knee To Floor Left: Right: From Medial Instep 56 cm Vascular Assessment Pulses: Dorsalis Pedis Palpable: [Left:Yes] Extremity colors, hair growth, and conditions: Extremity Color: [Left:Hyperpigmented] Hair Growth on Extremity: [Left:No] Temperature of Extremity: [Left:Warm] Capillary Refill: [Left:< 3 seconds] Dependent Rubor: [Left:No] Blanched when Elevated: [Left:No No] Toe Nail Assessment Left: Right: Thick: Yes Discolored: Yes Pavlovich, Rondel [name] ([id number]) [id number] Page 4 of 9 Deformed: Yes Improper Length and Hygiene: Yes Electronic Signature(s) Signed: 07/27/2023 11:41:37 AM By: [name] RN Entered By: [name] on 07/27/2023 11:19:49 -------------------------------------------------------------------------------- Multi Wound Chart Details Patient Name: Date of Service: [name] LT, CO SLO W [name] 07/27/2023 11:15 [name] [name] Medical Record Number: [id number] Patient Account Number: [id number] Date of Birth/Sex: Treating RN: 11-26-1970 (52 y.o. [name]) [name] Primary Care Austina Constantin: Maudie [name] Other Clinician: Referring Shon Mansouri: Treating Terrye Dombrosky/Extender: [name] in Treatment: 23 Vital Signs Height(in): 76 Pulse(bpm): 72 Weight(lbs): 400 Blood Pressure(mmHg): 135/78 Body Mass Index(BMI): 48.7 Temperature(F): 98.5 Respiratory Rate(breaths/min): 18 [22:Photos:] [24:No Photos] [N/[name]:N/[name]] Left Lower Leg Left, Medial Lower Leg N/[name] Wound Location: Gradually Appeared Trauma N/[name] Wounding Event: Diabetic Wound/Ulcer of the Lower Diabetic Wound/Ulcer of the Lower N/[name] Primary Etiology: Extremity  Extremity Hypertension, Type II Diabetes, Hypertension, Type II Diabetes, N/[name] Comorbid History: Neuropathy Neuropathy 08/30/2022 07/17/2023 N/[name] Date Acquired: 23 1 N/[name] Weeks of Treatment: Open Open N/[name] Wound Status: No No N/[name] Wound Recurrence: 0.3x0.3x0.1 0x0x0 N/[name] Measurements L x W x D (cm) 0.071 0 N/[name] [name] (cm) : rea 0.007 0 N/[name] Volume (cm) : 99.90% 100.00% N/[name] % Reduction in [name] rea: 100.00% 100.00% N/[name] % Reduction in Volume: Grade 2 Grade 1 N/[name] Classification: Medium None Present N/[name] Exudate [name] mount: Serosanguineous N/[name] N/[name] Exudate Type: red, brown N/[name] N/[name] Exudate Color: Large (67-100%) None Present (0%) N/[name] Granulation [name] mount: Red N/[name] N/[name] Granulation Quality: None Present (0%) None  Present (0%) N/[name] Necrotic [name] mount: Fat Layer (Subcutaneous Tissue): Yes Fascia: No N/[name] Exposed Structures: Fascia: No Fat Layer (Subcutaneous Tissue): No Tendon: No Tendon: No Muscle: No Muscle: No Joint: No Joint: No Bone: No Bone: No None Large (67-100%) N/[name] Epithelialization: Cypert, Eivin [name] ([id number]) [id number] Page 5 of 9 Treatment Notes Electronic Signature(s) Signed: 07/27/2023 11:41:37 AM By: [name] RN Entered By: [name] on 07/27/2023 11:16:25 -------------------------------------------------------------------------------- Multi-Disciplinary Care Plan Details Patient Name: Date of Service: [name] LT, CO SLO W [name] 07/27/2023 11:15 [name] [name] Medical Record Number: [id number] Patient Account Number: [id number] Date of Birth/Sex: Treating RN: 11-Nov-1970 (52 y.o. [name] [name] Primary Care Cade Olberding: Maudie [name] Other Clinician: Referring Kenzlie Disch: Treating Etana Beets/Extender: [name] in Treatment: 23 Active Inactive Electronic Signature(s) Signed: 07/27/2023 11:41:37 AM By: [name] RN Entered By: [name] on 07/27/2023  10:54:12 -------------------------------------------------------------------------------- Pain Assessment Details Patient Name: Date of Service: [name] LT, CO SLO W [name] 07/27/2023 11:15 [name] [name] Medical Record Number: [id number] Patient Account Number: [id number] Date of Birth/Sex: Treating RN: 1970-11-17 (52 y.o. [name] [name] Primary Care Triva Hueber: Maudie [name] Other Clinician: Referring Lane Eland: Treating Mika Griffitts/Extender: [name] in Treatment: 23 Active Problems Location of Pain Severity and Description of Pain Patient Has Paino No Site Locations [place], Geoff [name] ([id number]) [id number] Page 6 of 9 Pain Management and Medication Current Pain Management: Electronic Signature(s) Signed: 07/27/2023 11:41:37 AM By: [name] RN Entered By: [name] on 07/27/2023 10:43:50 -------------------------------------------------------------------------------- Patient/Caregiver Education Details Patient Name: Date of Service: [name] LT, CO SLO W [name] 11/27/2024andnbsp11:15 [name] [name] Medical Record Number: [id number] Patient Account Number: [id number] Date of Birth/Gender: Treating RN: [birth year] (52 y.o. [name] [name] Primary Care Physician: Maudie [name] Other Clinician: Referring Physician: Treating Physician/Extender: [name] in Treatment: 23 Education Assessment Education Provided To: Patient Education Topics Provided Wound/Skin Impairment: Handouts: Caring for Your Ulcer Methods: Explain/Verbal Responses: State content correctly Electronic Signature(s) Signed: 07/27/2023 11:41:37 AM By: [name] RN Entered By: [name] on 07/27/2023 10:54:26 Yard, Rollo [name] ([id number]) [id number] Page 7 of 9 -------------------------------------------------------------------------------- Wound Assessment Details Patient Name: Date of Service: [name] LT, CO SLO W [name] 07/27/2023 11:15 [name]  [name] Medical Record Number: [id number] Patient Account Number: [id number] Date of Birth/Sex: Treating RN: [birth year] (52 y.o. [name]) [name] Primary Care Jaysten Essner: Maudie [name] Other Clinician: Referring Arzella Rehmann: Treating Maize Brittingham/Extender: [name] in Treatment: 23 Wound Status Wound Number: 22 Primary Etiology: Diabetic Wound/Ulcer of the Lower Extremity Wound Location: Left Lower Leg Wound Status: Open Wounding Event: Gradually Appeared Comorbid History: Hypertension, Type II Diabetes, Neuropathy Date Acquired: 08/30/2022 Weeks Of Treatment: 23 Clustered Wound: No Photos Wound Measurements Length: (cm) 0.3 Width: (cm) 0.3 Depth: (cm) 0.1 Area: (cm) 0.071 Volume: (cm) 0.007 % Reduction in Area: 99.9% % Reduction in Volume: 100% Epithelialization: None Tunneling: No Undermining: No Wound Description Classification: Grade 2 Exudate Amount: Medium Exudate Type: Serosanguineous Exudate Color: red, brown Foul Odor After Cleansing: No Slough/Fibrino Yes Wound Bed Granulation Amount: Large (67-100%) Exposed Structure Granulation Quality: Red Fascia Exposed: No Necrotic Amount: None Present (0%) Fat Layer (Subcutaneous Tissue) Exposed: Yes Tendon Exposed: No Muscle Exposed: No Joint Exposed: No Bone Exposed: No Treatment Notes Wound #22 (Lower Leg) Wound Laterality: Left Cleanser Soap and Water Discharge Instruction: Gently cleanse wound with antibacterial soap, rinse and pat dry prior to dressing wounds Wound Cleanser [place], Orris [name] ([id number]) [id number] Page 8 of 9 Discharge Instruction: Wash your hands with soap  and water. Remove old dressing, discard into plastic bag and place into trash. Cleanse the wound with Wound Cleanser prior to applying [name] clean dressing using gauze sponges, not tissues or cotton balls. Do not scrub or use excessive force. Pat dry using gauze sponges, not tissue or cotton  balls. Peri-Wound Care Topical Primary Dressing Xeroform-HBD 2x2 (in/in) Discharge Instruction: Apply Xeroform-HBD 2x2 (in/in) as directed Secondary Dressing ABD Pad 5x9 (in/in) Discharge Instruction: Cover with ABD pad Secured With Tubigrip Size D, 3x10 (in/yd) Compression Wrap Compression Stockings Add-Ons Electronic Signature(s) Signed: 07/27/2023 11:41:37 AM By: [name] RN Entered By: [name] on 07/27/2023 10:51:46 -------------------------------------------------------------------------------- Wound Assessment Details Patient Name: Date of Service: [name] LT, CO SLO W [name] 07/27/2023 11:15 [name] [name] Medical Record Number: [id number] Patient Account Number: [id number] Date of Birth/Sex: Treating RN: March 07, 1971 (52 y.o. [name]) [name] Primary Care Quintella Mura: Maudie [name] Other Clinician: Referring Yaritzi Craun: Treating Kemyah Buser/Extender: [name] in Treatment: 23 Wound Status Wound Number: 24 Primary Etiology: Diabetic Wound/Ulcer of the Lower Extremity Wound Location: Left, Medial Lower Leg Wound Status: Open Wounding Event: Trauma Comorbid History: Hypertension, Type II Diabetes, Neuropathy Date Acquired: 07/17/2023 Weeks Of Treatment: 1 Clustered Wound: No Wound Measurements Length: (cm) Width: (cm) Depth: (cm) Area: (cm) Volume: (cm) 0 % Reduction in Area: 100% 0 % Reduction in Volume: 100% 0 Epithelialization: Large (67-100%) 0 Tunneling: No 0 Undermining: No Wound Description Classification: Grade 1 Exudate Amount: None Present Foul Odor After Cleansing: No Slough/Fibrino No Wound Bed Granulation Amount: None Present (0%) Exposed Structure Necrotic Amount: None Present (0%) Fascia Exposed: No Fat Layer (Subcutaneous Tissue) Exposed: No Tendon Exposed: No Corona, Jelan [name] ([id number]) [id number] Page 9 of 9 Muscle Exposed: No Joint Exposed: No Bone Exposed: No Electronic Signature(s) Signed:  07/27/2023 11:41:37 AM By: [name] RN Entered By: [name] on 07/27/2023 10:52:49 -------------------------------------------------------------------------------- Vitals Details Patient Name: Date of Service: [name] LT, CO SLO W [name] 07/27/2023 11:15 [name] [name] Medical Record Number: [id number] Patient Account Number: [id number] Date of Birth/Sex: Treating RN: 07-01-1971 (52 y.o. [name]) [name] Primary Care Armetta Henri: Maudie [name] Other Clinician: Referring Ania Levay: Treating Kynzleigh Bandel/Extender: [name] in Treatment: 23 Vital Signs Time Taken: 10:42 Temperature (F): 98.5 Height (in): 76 Pulse (bpm): 72 Weight (lbs): 400 Respiratory Rate (breaths/min): 18 Body Mass Index (BMI): 48.7 Blood Pressure (mmHg): 135/78 Reference Range: 80 - 120 mg / dl Electronic Signature(s) Signed: 07/27/2023 11:41:37 AM By: [name] RN Entered By: [name] on 07/27/2023 10:43:40

## 2023-07-27 NOTE — Progress Notes (Signed)
[name] ([id number]) [id number] Page 1 of 4 Visit Report for 07/27/2023 Chief Complaint Document Details Patient Name: Date of Service: [name], [name] 07/27/2023 11:15 [name] Medical Record Number: [id number] Patient Account Number: [id number] Date of Birth/Sex: Treating RN: 29-Oct-1970 (52 y.o. [name]) [name] Primary Care Provider: [name] Other Clinician: Referring Provider: Treating Provider/Extender: [name] in Treatment: 23 Information Obtained from: Patient Chief Complaint Left LE Ulcer Electronic Signature(s) Signed: 07/27/2023 11:11:33 AM By: Allen Derry PA-C Entered By: Allen Derry on 07/27/2023 11:11:32 -------------------------------------------------------------------------------- Debridement Details Patient Name: Date of Service: [name], [name] 07/27/2023 11:15 [name] Medical Record Number: [id number] Patient Account Number: [id number] Date of Birth/Sex: Treating RN: September 29, 1970 (52 y.o. [name] Primary Care Provider: [name] Other Clinician: Referring Provider: Treating Provider/Extender: [name] in Treatment: 23 Debridement Performed for Assessment: Wound #22 Left Lower Leg Performed By: Physician Allen Derry, PA-C The following information was scribed by: [name] The information was scribed for: Allen Derry Debridement Type: Debridement Severity of Tissue Pre Debridement: Fat layer exposed Level of Consciousness (Pre-procedure): Awake and Alert Pre-procedure Verification/Time Out Yes - 11:15 Taken: Start Time: 11:15 Percent of Wound Bed Debrided: 100% T Area Debrided (cm): otal 0.07 Tissue and other material debrided: Viable, Non-Viable, Slough, Subcutaneous, Skin: Dermis , Skin: Epidermis, Slough Level: Skin/Subcutaneous Tissue Debridement Description: Excisional Instrument: Curette Bleeding: Minimum Hemostasis Achieved:  Pressure [name], [name] ([id number]) [id number] Page 2 of 4 End Time: 11:18 Procedural Pain: 0 Post Procedural Pain: 0 Response to Treatment: Procedure was tolerated well Level of Consciousness (Post- Awake and Alert procedure): Post Debridement Measurements of Total Wound Length: (cm) 0.3 Width: (cm) 0.3 Depth: (cm) 0.1 Volume: (cm) 0.007 Character of Wound/Ulcer Post Debridement: Improved Severity of Tissue Post Debridement: Fat layer exposed Post Procedure Diagnosis Same as Pre-procedure Electronic Signature(s) Signed: 07/27/2023 11:41:37 AM By: [name] RN Entered By: [name] on 07/27/2023 11:17:57 -------------------------------------------------------------------------------- Physician Orders Details Patient Name: Date of Service: [name], [name] 07/27/2023 11:15 [name] Medical Record Number: [id number] Patient Account Number: [id number] Date of Birth/Sex: Treating RN: [birth year] (52 y.o. [name]) [name] Primary Care Provider: [name] Other Clinician: Referring Provider: Treating Provider/Extender: [name] in Treatment: 23 The following information was scribed by: [name] The information was scribed for: Allen Derry Verbal / Phone Orders: No Diagnosis Coding ICD-10 Coding Code Description [phone] Non-pressure chronic ulcer of other part of left lower leg with fat layer exposed I87.312 Chronic venous hypertension (idiopathic) with ulcer of left lower extremity I89.0 Lymphedema, not elsewhere classified I87.321 Chronic venous hypertension (idiopathic) with inflammation of right lower extremity E11.622 Type 2 diabetes mellitus with other skin ulcer Follow-up Appointments Return Appointment in 1 week. Home Health Home Health Company: [name] [phone] [facility] for wound care. May utilize formulary equivalent dressing for wound treatment orders unless otherwise specified. Home  Health Nurse may visit PRN to address patients wound care needs. [name] [phone] Scheduled days for dressing changes to be completed; exception, patient has scheduled wound care visit that day. **Please direct any NON-WOUND related issues/requests for orders to patient's Primary Care Physician. **If current dressing causes regression in wound condition, may D/C ordered dressing product/s and apply Normal Saline Moist Dressing daily until next Wound Healing Center or Other MD appointment. **Notify Wound Healing Center of regression in wound condition at [phone]. Bathing/ Shower/ Hygiene May shower with wound  dressing protected with water repellent cover or cast protector. No tub bath. [name], [name] [id number] Page 3 of 4 Anesthetic (Use 'Patient Medications' Section for Anesthetic Order Entry) Lidocaine applied to wound bed Wound Treatment Wound #22 - Lower Leg Wound Laterality: Left Cleanser: Soap and Water 3 x Per Week/30 Days Discharge Instructions: Gently cleanse wound with antibacterial soap, rinse and pat dry prior to dressing wounds Cleanser: Wound Cleanser 3 x Per Week/30 Days Discharge Instructions: Wash your hands with soap and water. Remove old dressing, discard into plastic bag and place into trash. Cleanse the wound with Wound Cleanser prior to applying [name] clean dressing using gauze sponges, not tissues or cotton balls. Do not scrub or use excessive force. Pat dry using gauze sponges, not tissue or cotton balls. Prim Dressing: Xeroform-HBD 2x2 (in/in) 3 x Per Week/30 Days ary Discharge Instructions: Apply Xeroform-HBD 2x2 (in/in) as directed Secondary Dressing: ABD Pad 5x9 (in/in) 3 x Per Week/30 Days Discharge Instructions: Cover with ABD pad Secured With: Tubigrip Size D, 3x10 (in/yd) 3 x Per Week/30 Days Electronic Signature(s) Signed: 07/27/2023 11:41:37 AM By: [name] RN Entered By: [name] on 07/27/2023  11:38:33 -------------------------------------------------------------------------------- Problem List Details Patient Name: Date of Service: [name], [name] 07/27/2023 11:15 [name] Medical Record Number: [id number] Patient Account Number: [id number] Date of Birth/Sex: Treating RN: [birth year] (52 y.o. [name] Primary Care Provider: [name] Other Clinician: Referring Provider: Treating Provider/Extender: [name] in Treatment: 23 Active Problems ICD-10 Encounter Code Description Active Date MDM Diagnosis [id number] Non-pressure chronic ulcer of other part of left lower leg with fat layer exposed6/19/2024 No Yes I87.312 Chronic venous hypertension (idiopathic) with ulcer of left lower extremity 02/16/2023 No Yes I89.0 Lymphedema, not elsewhere classified 03/23/2023 No Yes I87.321 Chronic venous hypertension (idiopathic) with inflammation of right lower 02/16/2023 No Yes extremity E11.622 Type 2 diabetes mellitus with other skin ulcer 02/16/2023 No Yes [name], [name] ([id number]) [id number] Page 4 of 4 Inactive Problems ICD-10 Code Description Active Date Inactive Date L97.522 Non-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

## 2023-07-30 DIAGNOSIS — Z992 Dependence on renal dialysis: Secondary | ICD-10-CM | POA: Diagnosis not present

## 2023-07-30 DIAGNOSIS — N2581 Secondary hyperparathyroidism of renal origin: Secondary | ICD-10-CM | POA: Diagnosis not present

## 2023-07-30 DIAGNOSIS — N186 End stage renal disease: Secondary | ICD-10-CM | POA: Diagnosis not present

## 2023-08-01 ENCOUNTER — Ambulatory Visit (INDEPENDENT_AMBULATORY_CARE_PROVIDER_SITE_OTHER): Payer: Medicare HMO

## 2023-08-01 ENCOUNTER — Encounter (INDEPENDENT_AMBULATORY_CARE_PROVIDER_SITE_OTHER): Payer: Self-pay | Admitting: Vascular Surgery

## 2023-08-01 ENCOUNTER — Ambulatory Visit (INDEPENDENT_AMBULATORY_CARE_PROVIDER_SITE_OTHER): Payer: Medicare HMO | Admitting: Vascular Surgery

## 2023-08-01 VITALS — BP 162/83 | HR 65 | Resp 16 | Wt >= 6400 oz

## 2023-08-01 DIAGNOSIS — N186 End stage renal disease: Secondary | ICD-10-CM

## 2023-08-01 DIAGNOSIS — E1059 Type 1 diabetes mellitus with other circulatory complications: Secondary | ICD-10-CM

## 2023-08-01 DIAGNOSIS — I1A0 Resistant hypertension: Secondary | ICD-10-CM

## 2023-08-01 DIAGNOSIS — I872 Venous insufficiency (chronic) (peripheral): Secondary | ICD-10-CM

## 2023-08-01 DIAGNOSIS — J449 Chronic obstructive pulmonary disease, unspecified: Secondary | ICD-10-CM | POA: Diagnosis not present

## 2023-08-01 NOTE — Progress Notes (Signed)
MRN : [id number]  [name] is a 52 y.o. ([birth year]) male who presents with chief complaint of check access.  History of Present Illness:   The patient returns to the office for followup status post intervention of their dialysis access 07/08/2023.   Following the intervention the access function has significantly improved, with better flow rates and improved KT/V. The patient has not been experiencing increased bleeding times following decannulation and the patient denies increased recirculation. The patient denies an increase in arm swelling. At the present time the patient denies hand pain.  No recent shortening of the patient's walking distance or new symptoms consistent with claudication.  No history of rest pain symptoms. No new ulcers or wounds of the lower extremities have occurred.  The patient denies amaurosis fugax or recent TIA symptoms. There are no recent neurological changes noted. There is no history of DVT, PE or superficial thrombophlebitis. No recent episodes of angina or shortness of breath documented.   Duplex ultrasound of the AV access shows a patent access.  The previously noted stenosis is improved compared to last study.  Flow volume today is 847 cc/min (previous flow volume was 773 cc/min)     No outpatient medications have been marked as taking for the 08/01/23 encounter (Appointment) with Gilda Crease, Latina Craver, MD.    Past Medical History:  Diagnosis Date   Anemia    Chronic kidney disease    14% FUNCTION   Diabetes mellitus without complication (HCC)    Diabetic retinopathy (HCC)    Dyspnea    DOE   Dysrhythmia    End stage renal disease (HCC)    History of orthopnea    error wrong patietn   Hypercholesteremia    error   Hyperkalemia    error wrong patient   Hypertension    Long-term insulin use (HCC)    Lymphedema of leg    Metabolic encephalopathy    error   Microalbuminuria    MSSA (methicillin susceptible  Staphylococcus aureus)    Obesity    PVD (peripheral vascular disease) (HCC)    Respiratory failure (HCC)    Error   Sepsis (HCC)    error   Sleep apnea    no CPAP   UTI (urinary tract infection)    error   Venous ulcer (HCC)    Vitamin D deficiency     Past Surgical History:  Procedure Laterality Date   A/V FISTULAGRAM Right 10/24/2018   Procedure: A/V FISTULAGRAM;  Surgeon: Renford Dills, MD;  Location: [facility];  Service: Cardiovascular;  Laterality: Right;   A/V FISTULAGRAM Right 11/24/2018   Procedure: A/V FISTULAGRAM;  Surgeon: Renford Dills, MD;  Location: [facility];  Service: Cardiovascular;  Laterality: Right;   A/V FISTULAGRAM Left 07/31/2019   Procedure: A/V FISTULAGRAM;  Surgeon: Renford Dills, MD;  Location: [facility];  Service: Cardiovascular;  Laterality: Left;   A/V FISTULAGRAM Left 12/11/2019   Procedure: A/V FISTULAGRAM;  Surgeon: Renford Dills, MD;  Location: [facility];  Service: Cardiovascular;  Laterality: Left;   A/V FISTULAGRAM Left 07/08/2023   Procedure: A/V Fistulagram;  Surgeon: Renford Dills, MD;  Location: [facility];  Service: Cardiovascular;  Laterality: Left;   A/V SHUNT INTERVENTION Right 02/21/2019   Procedure: A/V SHUNT INTERVENTION;  Surgeon: Renford Dills, MD;  Location: [facility];  Service: Cardiovascular;  Laterality: Right;   APPLICATION OF WOUND VAC Left 10/03/2017   Procedure: APPLICATION OF WOUND VAC;  Surgeon: Annice Needy, MD;  Location: [facility];  Service: General;  Laterality: Left;   APPLICATION OF WOUND VAC Left 10/11/2017   Procedure: APPLICATION OF WOUND VAC;  Surgeon: Renford Dills, MD;  Location: [facility];  Service: Vascular;  Laterality: Left;   APPLICATION OF WOUND VAC Left 10/14/2017   Procedure: WOUND VAC CHANGE;  Surgeon: Renford Dills, MD;  Location: [facility];  Service: Vascular;  Laterality: Left;  left lower leg   APPLICATION  OF WOUND VAC Left 10/18/2017   Procedure: WOUND VAC CHANGE;  Surgeon: Renford Dills, MD;  Location: [facility];  Service: Vascular;  Laterality: Left;   APPLICATION OF WOUND VAC Left 10/07/2017   Procedure: APPLICATION OF WOUND VAC;  Surgeon: Renford Dills, MD;  Location: [facility];  Service: Vascular;  Laterality: Left;   APPLICATION OF WOUND VAC Left 11/17/2022   Procedure: Wound vac exchange;  Surgeon: Renford Dills, MD;  Location: [facility];  Service: Vascular;  Laterality: Left;   APPLICATION OF WOUND VAC Left 11/10/2022   Procedure: APPLICATION OF WOUND VAC;  Surgeon: Renford Dills, MD;  Location: [facility];  Service: Vascular;  Laterality: Left;   APPLICATION OF WOUND VAC Left 11/24/2022   Procedure: WOUND VAC REMOVAL;  Surgeon: Renford Dills, MD;  Location: [facility];  Service: Vascular;  Laterality: Left;   APPLICATION OF WOUND VAC Left 11/12/2022   Procedure: APPLICATION OF WOUND VAC;  Surgeon: Renford Dills, MD;  Location: [facility];  Service: Vascular;  Laterality: Left;   APPLICATION OF WOUND VAC Left 01/06/2023   Procedure: APPLICATION OF WOUND VAC;  Surgeon: Renford Dills, MD;  Location: [facility];  Service: Vascular;  Laterality: Left;   AV FISTULA INSERTION W/ RF MAGNETIC GUIDANCE Right 07/19/2018   Procedure: AV FISTULA INSERTION W/RF MAGNETIC GUIDANCE;  Surgeon: Renford Dills, MD;  Location: [facility];  Service: Cardiovascular;  Laterality: Right;   AV FISTULA PLACEMENT Left 05/11/2019   Procedure: ARTERIOVENOUS (AV) FISTULA CREATION (RADIOCEPHALIC );  Surgeon: Renford Dills, MD;  Location: [facility];  Service: Vascular;  Laterality: Left;   AV FISTULA PLACEMENT Left 02/20/2020   Procedure: INSERTION OF ARTERIOVENOUS (AV) GORE-TEX GRAFT LEFT ARM ( FOREARM LOOP );  Surgeon: Renford Dills, MD;  Location: [facility];  Service: Vascular;  Laterality: Left;   CATARACT EXTRACTION W/PHACO Left 11/01/2018   Procedure: CATARACT EXTRACTION PHACO AND  INTRAOCULAR LENS PLACEMENT (IOC)-LEFT, DIABETIC-INSULIN DEPENDENT;  Surgeon: Nevada Crane, MD;  Location: [facility];  Service: Ophthalmology;  Laterality: Left;  [place] 00:41.8 CDE 4.61 Fluid Pack Lot # [id number]   CATARACT EXTRACTION W/PHACO Right 04/27/2019   Procedure: CATARACT EXTRACTION PHACO AND INTRAOCULAR LENS PLACEMENT (IOC);  Surgeon: Nevada Crane, MD;  Location: [facility];  Service: Ophthalmology;  Laterality: Right;  [place] 00:34 CDE 1.97 FLUID PACK LOT # [id number]    COLONOSCOPY WITH PROPOFOL N/A 05/12/2020   Procedure: COLONOSCOPY WITH PROPOFOL;  Surgeon: [place], Boykin Nearing, MD;  Location: [facility];  Service: Gastroenterology;  Laterality: N/A;   DIALYSIS/PERMA CATHETER INSERTION N/A 02/23/2019   Procedure: DIALYSIS/PERMA CATHETER INSERTION;  Surgeon: Annice Needy, MD;  Location: [facility];  Service: Cardiovascular;  Laterality: N/A;   DIALYSIS/PERMA CATHETER REMOVAL N/A 04/03/2020   Procedure: DIALYSIS/PERMA CATHETER REMOVAL;  Surgeon: Renford Dills, MD;  Location: [facility];  Service: Cardiovascular;  Laterality: N/A;   DIALYSIS/PERMA CATHETER REMOVAL N/A 02/03/2022   Procedure: DIALYSIS/PERMA CATHETER REMOVAL;  Surgeon: Renford Dills, MD;  Location: [facility];  Service: Cardiovascular;  Laterality: N/A;   I & D EXTREMITY Left 10/11/2017   Procedure: IRRIGATION AND DEBRIDEMENT EXTREMITY;  Surgeon: Renford Dills, MD;  Location: [facility];  Service: Vascular;  Laterality: Left;   INCISION AND DRAINAGE ABSCESS Right 10/07/2017   Procedure: INCISION AND DRAINAGE ABSCESS;  Surgeon: Renford Dills, MD;  Location: [facility];  Service: Vascular;  Laterality: Right;   INCISION AND DRAINAGE OF WOUND Left 11/17/2022   Procedure: DEBRIDEMENT WOUND;  Surgeon: Renford Dills, MD;  Location: [facility];  Service: Vascular;  Laterality: Left;   INCISION AND DRAINAGE OF WOUND Left 11/24/2022   Procedure: DEBRIDEMENT WOUND LEFT LOWER EXTREMITY;   Surgeon: Renford Dills, MD;  Location: [facility];  Service: Vascular;  Laterality: Left;   IRRIGATION AND DEBRIDEMENT ABSCESS Left 10/03/2017   Procedure: IRRIGATION AND DEBRIDEMENT ABSCESS with debridement of skin, soft tissue, muscle 50sq cm;  Surgeon: Annice Needy, MD;  Location: [facility];  Service: General;  Laterality: Left;   LOWER EXTREMITY ANGIOGRAPHY Left 10/15/2022   Procedure: Lower Extremity Angiography;  Surgeon: Renford Dills, MD;  Location: [facility];  Service: Cardiovascular;  Laterality: Left;   TEE WITHOUT CARDIOVERSION N/A 03/29/2023   Procedure: TRANSESOPHAGEAL ECHOCARDIOGRAM;  Surgeon: Antonieta Iba, MD;  Location: [facility];  Service: Cardiovascular;  Laterality: N/A;   TEMPORARY DIALYSIS CATHETER  02/20/2019   Procedure: TEMPORARY DIALYSIS CATHETER;  Surgeon: Renford Dills, MD;  Location: [facility];  Service: Cardiovascular;;   UPPER EXTREMITY ANGIOGRAPHY Left 09/18/2019   Procedure: UPPER EXTREMITY ANGIOGRAPHY;  Surgeon: Renford Dills, MD;  Location: [facility];  Service: Cardiovascular;  Laterality: Left;   WOUND DEBRIDEMENT Left 11/10/2022   Procedure: DEBRIDEMENT WOUND;  Surgeon: Renford Dills, MD;  Location: [facility];  Service: Vascular;  Laterality: Left;   WOUND DEBRIDEMENT Left 11/12/2022   Procedure: DEBRIDEMENT WOUND;  Surgeon: Renford Dills, MD;  Location: [facility];  Service: Vascular;  Laterality: Left;   WOUND DEBRIDEMENT Left 01/06/2023   Procedure: DEBRIDEMENT WOUND;  Surgeon: Renford Dills, MD;  Location: [facility];  Service: Vascular;  Laterality: Left;    Social History Social History   Tobacco Use   Smoking status: Former    Types: Cigars    Passive exposure: Past   Smokeless tobacco: Former    Quit date: 09/30/2017   Tobacco comments:    occasional  Vaping Use   Vaping status: Never Used  Substance Use Topics   Alcohol use: Yes    Alcohol/week: 1.0 standard drink of alcohol    Types:  1 Cans of beer per week    Comment: beer   Drug use: Not Currently    Family History Family History  Problem Relation Age of Onset   Diabetes Father    Kidney disease Father    Diabetes Daughter     Allergies  Allergen Reactions   Lisinopril Swelling    Lips mouth    Furosemide Cough    Light headed, blurry vision, weakness.   Latex Itching   Silicone Itching   Adhesive [Tape] Itching and Rash     REVIEW OF SYSTEMS (Negative unless checked)  Constitutional: [] Weight loss  [] Fever  [] Chills Cardiac: [] Chest pain   [] Chest pressure   [] Palpitations   []   Shortness of breath when laying flat   [] Shortness of breath with exertion. Vascular:  [] Pain in legs with walking   [] Pain in legs at rest  [] History of DVT   [] Phlebitis   [] Swelling in legs   [] Varicose veins   [] Non-healing ulcers Pulmonary:   [] Uses home oxygen   [] Productive cough   [] Hemoptysis   [] Wheeze  [] COPD   [] Asthma Neurologic:  [] Dizziness   [] Seizures   [] History of stroke   [] History of TIA  [] Aphasia   [] Vissual changes   [] Weakness or numbness in arm   [] Weakness or numbness in leg Musculoskeletal:   [] Joint swelling   [] Joint pain   [] Low back pain Hematologic:  [] Easy bruising  [] Easy bleeding   [] Hypercoagulable state   [] Anemic Gastrointestinal:  [] Diarrhea   [] Vomiting  [] Gastroesophageal reflux/heartburn   [] Difficulty swallowing. Genitourinary:  [x] Chronic kidney disease   [] Difficult urination  [] Frequent urination   [] Blood in urine Skin:  [] Rashes   [] Ulcers  Psychological:  [] History of anxiety   []  History of major depression.  Physical Examination  There were no vitals filed for this visit. There is no height or weight on file to calculate BMI. Gen: WD/WN, NAD Head: [place]/AT, No temporalis wasting.  Ear/Nose/Throat: Hearing grossly intact, nares w/o erythema or drainage Eyes: PER, EOMI, sclera nonicteric.  Neck: Supple, no gross masses or lesions.  No JVD.  Pulmonary:  Good air movement, no  audible wheezing, no use of accessory muscles.  Cardiac: RRR, precordium non-hyperdynamic. Vascular:   Left arm loop graft good thrill good bruit. scattered varicosities present bilaterally.  Moderate venous stasis changes to the legs bilaterally.  2+ soft pitting edema. CEAP [id number] left calf ulcer nearly healed Vessel Right Left  Radial Palpable Palpable  Brachial Palpable Palpable  Gastrointestinal: soft, non-distended. No guarding/no peritoneal signs.  Musculoskeletal: M/S 5/5 throughout.  No deformity.  Neurologic: CN 2-12 intact. Pain and light touch intact in extremities.  Symmetrical.  Speech is fluent. Motor exam as listed above. Psychiatric: Judgment intact, Mood & affect appropriate for pt's clinical situation. Dermatologic: Severe venous rashes resolving ulcers noted.  No changes consistent with cellulitis.   CBC Lab Results  Component Value Date   WBC 5.7 03/29/2023   HGB 10.9 (L) 03/29/2023   HCT 34.0 (L) 03/29/2023   MCV 93.2 03/29/2023   PLT 151 03/29/2023    BMET    Component Value Date/Time   NA 138 03/29/2023 0620   K 4.1 03/29/2023 0620   CL 99 03/29/2023 0620   CO2 24 03/29/2023 0620   GLUCOSE 124 (H) 03/29/2023 0620   BUN 82 (H) 03/29/2023 0620   CREATININE 13.30 (H) 03/29/2023 0620   CALCIUM 9.5 03/29/2023 0620   GFRNONAA 4 (L) 03/29/2023 0620   GFRAA 9 (L) 02/19/2020 1316   CrCl cannot be calculated (Patient's most recent lab result is older than the maximum 21 days allowed.).  COAG Lab Results  Component Value Date   INR 1.0 03/24/2023   INR 1.1 11/12/2022   INR 1.0 11/10/2022    Radiology PERIPHERAL VASCULAR CATHETERIZATION  Result Date: 07/08/2023 See surgical note for result.    Assessment/Plan 1. End-stage renal disease (HCC) Recommend:  The patient is doing well and currently has adequate dialysis access. The patient's dialysis center is not reporting any access issues. Flow pattern is stable when compared to the prior  ultrasound.  The patient should have a duplex ultrasound of the dialysis access in 3 months. The patient will follow-up with  me in the office after each ultrasound   - VAS US DUPLEX DIALYSIS ACCESS (AVF, AVG); Future  2. Chronic venous insufficiency Recommend:  No surgery or intervention at this point in time.    I have reviewed my discussion with the patient regarding lymphedema and why it  causes symptoms.  Patient will continue wearing graduated compression on a daily basis. The patient should put the compression on first thing in the morning and removing them in the evening. The patient should not sleep in the compression.   In addition, behavioral modification throughout the day will be continued.  This will include frequent elevation (such as in a recliner), use of over the counter pain medications as needed and exercise such as walking.  The systemic causes for chronic edema such as liver, kidney and cardiac etiologies does not appear to have significant changed over the past year.    The patient will continue aggressive use of the  lymph pump.  This will continue to improve the edema control and prevent sequela such as ulcers and infections.   The patient will follow-up with me as ordered.   3. Resistant hypertension Continue antihypertensive medications as already ordered, these medications have been reviewed and there are no changes at this time.  4. Chronic obstructive pulmonary disease, unspecified COPD type (HCC) Continue pulmonary medications and aerosols as already ordered, these medications have been reviewed and there are no changes at this time.   5. Type 1 diabetes mellitus with other circulatory complication (HCC) Continue hypoglycemic medications as already ordered, these medications have been reviewed and there are no changes at this time.  Hgb A1C to be monitored as already arranged by primary service    [name], MD  08/01/2023 1:03 PM

## 2023-08-02 DIAGNOSIS — Z992 Dependence on renal dialysis: Secondary | ICD-10-CM | POA: Diagnosis not present

## 2023-08-02 DIAGNOSIS — N186 End stage renal disease: Secondary | ICD-10-CM | POA: Diagnosis not present

## 2023-08-03 ENCOUNTER — Encounter: Payer: Medicare HMO | Attending: Physician Assistant

## 2023-08-03 DIAGNOSIS — I87312 Chronic venous hypertension (idiopathic) with ulcer of left lower extremity: Secondary | ICD-10-CM | POA: Diagnosis not present

## 2023-08-03 DIAGNOSIS — Z563 Stressful work schedule: Secondary | ICD-10-CM | POA: Insufficient documentation

## 2023-08-03 DIAGNOSIS — L97822 Non-pressure chronic ulcer of other part of left lower leg with fat layer exposed: Secondary | ICD-10-CM | POA: Insufficient documentation

## 2023-08-03 DIAGNOSIS — F32A Depression, unspecified: Secondary | ICD-10-CM | POA: Diagnosis not present

## 2023-08-03 DIAGNOSIS — Z792 Long term (current) use of antibiotics: Secondary | ICD-10-CM | POA: Diagnosis not present

## 2023-08-03 DIAGNOSIS — Z79899 Other long term (current) drug therapy: Secondary | ICD-10-CM | POA: Diagnosis not present

## 2023-08-03 DIAGNOSIS — I890 Lymphedema, not elsewhere classified: Secondary | ICD-10-CM | POA: Insufficient documentation

## 2023-08-03 DIAGNOSIS — Z992 Dependence on renal dialysis: Secondary | ICD-10-CM | POA: Diagnosis not present

## 2023-08-03 DIAGNOSIS — E1122 Type 2 diabetes mellitus with diabetic chronic kidney disease: Secondary | ICD-10-CM | POA: Diagnosis not present

## 2023-08-03 DIAGNOSIS — N184 Chronic kidney disease, stage 4 (severe): Secondary | ICD-10-CM | POA: Diagnosis not present

## 2023-08-03 DIAGNOSIS — I120 Hypertensive chronic kidney disease with stage 5 chronic kidney disease or end stage renal disease: Secondary | ICD-10-CM | POA: Insufficient documentation

## 2023-08-03 DIAGNOSIS — I87321 Chronic venous hypertension (idiopathic) with inflammation of right lower extremity: Secondary | ICD-10-CM | POA: Insufficient documentation

## 2023-08-03 DIAGNOSIS — I771 Stricture of artery: Secondary | ICD-10-CM | POA: Diagnosis not present

## 2023-08-03 DIAGNOSIS — E11622 Type 2 diabetes mellitus with other skin ulcer: Secondary | ICD-10-CM | POA: Diagnosis not present

## 2023-08-03 NOTE — Progress Notes (Signed)
[name], CAZES [name] ([id number]) [id number] Page 1 of 4 Visit Report for 08/03/2023 Arrival Information Details Patient Name: Date of Service: [name], [name] 08/03/2023 10:30 [name] Medical Record Number: [id number] Patient Account Number: [id number] Date of Birth/Sex: Treating RN: April 08, 1971 (52 y.o. [name]) [name] Primary Care Lucus Lambertson: [name] Other Clinician: Referring Neera Teng: Treating Janiah Devinney/Extender: [name] in Treatment: 24 Visit Information History Since Last Visit Added or deleted any medications: No Patient Arrived: Ambulatory Any new allergies or adverse reactions: No Arrival Time: 10:34 Had [name] fall or experienced change in No Accompanied By: self activities of daily living that may affect Transfer Assistance: None risk of falls: Patient Identification Verified: Yes Signs or symptoms of abuse/neglect since last visito No Secondary Verification Process Completed: Yes Hospitalized since last visit: No Patient Requires Transmission-Based No Implantable device outside of the clinic excluding No Precautions: cellular tissue based products placed in the center Patient Has Alerts: Yes since last visit: Patient Alerts: Patient on Blood Thinner Has Dressing in Place as Prescribed: Yes ABI R [place] TBI .61 10/11/22 Has Compression in Place as Prescribed: Yes ABI L [place] TBI .33 Pain Present Now: No 10/11/22 Electronic Signature(s) Signed: 08/03/2023 11:00:18 AM By: [name] RN Entered By: [name] on 08/03/2023 10:34:42 -------------------------------------------------------------------------------- Clinic Level of Care Assessment Details Patient Name: Date of Service: [name], [name] 08/03/2023 10:30 [name] Medical Record Number: [id number] Patient Account Number: [id number] Date of Birth/Sex: Treating RN: 08-06-1971 (52 y.o. [name] Primary Care Briyah Wheelwright: [name] Other Clinician: Referring  Zechariah Bissonnette: Treating [name]/Extender: [name] in Treatment: 24 Clinic Level of Care Assessment Items TOOL 4 Quantity Score X- 1 0 Use when only an EandM is performed on FOLLOW-UP visit ASSESSMENTS - Nursing Assessment / Reassessment X- 1 10 Reassessment of Co-morbidities (includes updates in patient status) X- 1 5 Reassessment of Adherence to Treatment Plan Lavalle, [name] ([id number]) [phone].pdf Page 2 of 4 ASSESSMENTS - Wound and Skin [name] ssessment / Reassessment X - Simple Wound Assessment / Reassessment - one wound 1 5 []  - 0 Complex Wound Assessment / Reassessment - multiple wounds []  - 0 Dermatologic / Skin Assessment (not related to wound area) ASSESSMENTS - Focused Assessment []  - 0 Circumferential Edema Measurements - multi extremities []  - 0 Nutritional Assessment / Counseling / Intervention []  - 0 Lower Extremity Assessment (monofilament, tuning fork, pulses) []  - 0 Peripheral Arterial Disease Assessment (using hand held doppler) ASSESSMENTS - Ostomy and/or Continence Assessment and Care []  - 0 Incontinence Assessment and Management []  - 0 Ostomy Care Assessment and Management (repouching, etc.) PROCESS - Coordination of Care X - Simple Patient / Family Education for ongoing care 1 15 []  - 0 Complex (extensive) Patient / Family Education for ongoing care []  - 0 Staff obtains Chiropractor, Records, T Results / Process Orders est []  - 0 Staff telephones HHA, Nursing Homes / Clarify orders / etc []  - 0 Routine Transfer to another Facility (non-emergent condition) []  - 0 Routine Hospital Admission (non-emergent condition) []  - 0 New Admissions / Manufacturing engineer / Ordering NPWT Apligraf, etc. , []  - 0 Emergency Hospital Admission (emergent condition) X- 1 10 Simple Discharge Coordination []  - 0 Complex (extensive) Discharge Coordination PROCESS - Special Needs []  - 0 Pediatric / Minor Patient  Management []  - 0 Isolation Patient Management []  - 0 Hearing / Language / Visual special needs []  - 0 Assessment of Community assistance (transportation, D/C planning,  etc.) []  - 0 Additional assistance / Altered mentation []  - 0 Support Surface(s) Assessment (bed, cushion, seat, etc.) INTERVENTIONS - Wound Cleansing / Measurement X - Simple Wound Cleansing - one wound 1 5 []  - 0 Complex Wound Cleansing - multiple wounds []  - 0 Wound Imaging (photographs - any number of wounds) []  - 0 Wound Tracing (instead of photographs) X- 1 5 Simple Wound Measurement - one wound []  - 0 Complex Wound Measurement - multiple wounds INTERVENTIONS - Wound Dressings []  - 0 Small Wound Dressing one or multiple wounds []  - 0 Medium Wound Dressing one or multiple wounds []  - 0 Large Wound Dressing one or multiple wounds []  - 0 Application of Medications - topical []  - 0 Application of Medications - injection INTERVENTIONS - Miscellaneous []  - 0 External ear exam Date, [name] ([id number]) [id number] Page 3 of 4 []  - 0 Specimen Collection (cultures, biopsies, blood, body fluids, etc.) []  - 0 Specimen(s) / Culture(s) sent or taken to Lab for analysis []  - 0 Patient Transfer (multiple staff / Michiel Sites Lift / Similar devices) []  - 0 Simple Staple / Suture removal (25 or less) []  - 0 Complex Staple / Suture removal (26 or more) []  - 0 Hypo / Hyperglycemic Management (close monitor of Blood Glucose) []  - 0 Ankle / Brachial Index (ABI) - do not check if billed separately X- 1 5 Vital Signs Has the patient been seen at the hospital within the last three years: Yes Total Score: 60 Level Of Care: New/Established - Level 2 Electronic Signature(s) Signed: 08/03/2023 11:00:18 AM By: [name] RN Entered By: [name] on 08/03/2023 10:59:06 -------------------------------------------------------------------------------- Encounter Discharge Information  Details Patient Name: Date of Service: [name], [name] 08/03/2023 10:30 [name] Medical Record Number: [id number] Patient Account Number: [id number] Date of Birth/Sex: Treating RN: June 14, 1971 (52 y.o. [name] Primary Care Aalyiah Camberos: [name] Other Clinician: Referring Jacqualynn Parco: Treating Tkai Serfass/Extender: [name] in Treatment: 24 Encounter Discharge Information Items Discharge Condition: Stable Ambulatory Status: Ambulatory Discharge Destination: Home Transportation: Private Auto Accompanied By: self Schedule Follow-up Appointment: Yes Clinical Summary of Care: Electronic Signature(s) Signed: 08/03/2023 10:58:40 AM By: [name] RN Entered By: [name] on 08/03/2023 10:58:39 Wound Assessment Details -------------------------------------------------------------------------------- [name] ([id number]) [id number] Page 4 of 4 Patient Name: Date of Service: [name], [name] 08/03/2023 10:30 [name] Medical Record Number: [id number] Patient Account Number: [id number] Date of Birth/Sex: Treating RN: November 20, 1970 (52 y.o. [name]) [name] Primary Care Ivann Trimarco: [name] Other Clinician: Referring Faithlynn Deeley: Treating Yumna Ebers/Extender: [name] in Treatment: 24 Wound Status Wound Number: 22 Primary Etiology: Diabetic Wound/Ulcer of the Lower Extremity Wound Location: Left Lower Leg Wound Status: Open Wounding Event: Gradually Appeared Comorbid History: Hypertension, Type II Diabetes, Neuropathy Date Acquired: 08/30/2022 Weeks Of Treatment: 24 Clustered Wound: No Wound Measurements Length: (cm) 0.3 % Reduction in Area: 99.9% Width: (cm) 0.3 % Reduction in Volume: 100% Depth: (cm) 0.1 Epithelialization: None Area: (cm) 0.071 Tunneling: No Volume: (cm) 0.007 Undermining: No Wound Description Classification: Grade 2 Foul Odor After Cleansing: No Exudate Amount: Medium  Slough/Fibrino Yes Exudate Type: Serosanguineous Exudate Color: red, brown Wound Bed Granulation Amount: Large (67-100%) Exposed Structure Granulation Quality: Red Fascia Exposed: No Necrotic Amount: None Present (0%) Fat Layer (Subcutaneous Tissue) Exposed: Yes Tendon Exposed: No Muscle Exposed: No Joint Exposed: No Bone Exposed: No Treatment Notes Wound #22 (Lower Leg) Wound Laterality: Left Cleanser Soap and Water Discharge Instruction:  Gently cleanse wound with antibacterial soap, rinse and pat dry prior to dressing wounds Wound Cleanser Discharge Instruction: Wash your hands with soap and water. Remove old dressing, discard into plastic bag and place into trash. Cleanse the wound with Wound Cleanser prior to applying [name] clean dressing using gauze sponges, not tissues or cotton balls. Do not scrub or use excessive force. Pat dry using gauze sponges, not tissue or cotton balls. Peri-Wound Care Topical Primary Dressing Xeroform-HBD 2x2 (in/in) Discharge Instruction: Apply Xeroform-HBD 2x2 (in/in) as directed Secondary Dressing ABD Pad 5x9 (in/in) Discharge Instruction: Cover with ABD pad Secured With Tubigrip Size D, 3x10 (in/yd) Compression Wrap Compression Stockings Add-Ons Electronic Signature(s) Signed: 08/03/2023 10:57:34 AM By: [name] RN Entered By: [name] on 08/03/2023 10:57:33

## 2023-08-04 DIAGNOSIS — N186 End stage renal disease: Secondary | ICD-10-CM | POA: Diagnosis not present

## 2023-08-04 DIAGNOSIS — Z992 Dependence on renal dialysis: Secondary | ICD-10-CM | POA: Diagnosis not present

## 2023-08-04 NOTE — Progress Notes (Signed)
KATRELL, SUBLER A ([id number]) [id number] Page 1 of 2 Visit Report for 08/03/2023 Physician Orders Details Patient Name: Date of Service: [name], [name] 08/03/2023 10:30 A M Medical Record Number: [id number] Patient Account Number: [id number] Date of Birth/Sex: Treating RN: 28-Aug-1971 (52 y.o. [name] Primary Care Provider: [name] Other Clinician: Referring Provider: Treating Provider/Extender: [name] in Treatment: 24 The following information was scribed by: [name] The information was scribed for: Allen Derry Verbal / Phone Orders: No Diagnosis Coding Follow-up Appointments Return Appointment in 1 week. Home Health Home [facility] Company: [name] [phone] [facility] for wound care. May utilize formulary equivalent dressing for wound treatment orders unless otherwise specified. Home Health Nurse may visit PRN to address patients wound care needs. [name] [phone] Scheduled days for dressing changes to be completed; exception, patient has scheduled wound care visit that day. **Please direct any NON-WOUND related issues/requests for orders to patient's Primary Care Physician. **If current dressing causes regression in wound condition, may D/C ordered dressing product/s and apply Normal Saline Moist Dressing daily until next Wound Healing Center or Other MD appointment. **Notify Wound Healing Center of regression in wound condition at [phone]. Bathing/ Shower/ Hygiene May shower with wound dressing protected with water repellent cover or cast protector. No tub bath. Anesthetic (Use 'Patient Medications' Section for Anesthetic Order Entry) Lidocaine applied to wound bed Wound Treatment Wound #22 - Lower Leg Wound Laterality: Left Cleanser: Soap and Water 3 x Per Week/30 Days Discharge Instructions: Gently cleanse wound with antibacterial soap, rinse and pat dry prior to dressing  wounds Cleanser: Wound Cleanser 3 x Per Week/30 Days Discharge Instructions: Wash your hands with soap and water. Remove old dressing, discard into plastic bag and place into trash. Cleanse the wound with Wound Cleanser prior to applying a clean dressing using gauze sponges, not tissues or cotton balls. Do not scrub or use excessive force. Pat dry using gauze sponges, not tissue or cotton balls. Prim Dressing: Xeroform-HBD 2x2 (in/in) 3 x Per Week/30 Days ary Discharge Instructions: Apply Xeroform-HBD 2x2 (in/in) as directed Secondary Dressing: ABD Pad 5x9 (in/in) 3 x Per Week/30 Days Discharge Instructions: Cover with ABD pad Secured With: Tubigrip Size D, 3x10 (in/yd) 3 x Per Week/30 Days Electronic Signature(s) Signed: 08/03/2023 10:58:13 AM By: [name] RN Signed: 08/04/2023 7:39:18 AM By: Allen Derry PA-C Entered By: [name] on 08/03/2023 10:58:12 Ryser, Furkan A ([id number]) [id number] Page 2 of 2 -------------------------------------------------------------------------------- SuperBill Details Patient Name: Date of Service: [name], [name] 08/03/2023 Medical Record Number: [id number] Patient Account Number: [id number] Date of Birth/Sex: Treating RN: 28-Jan-1971 (52 y.o. [name]) [name] Primary Care Provider: [name] Other Clinician: Referring Provider: Treating Provider/Extender: [name] in Treatment: 24 Diagnosis Coding ICD-10 Codes Code Description [phone] Non-pressure chronic ulcer of other part of left lower leg with fat layer exposed I87.312 Chronic venous hypertension (idiopathic) with ulcer of left lower extremity I89.0 Lymphedema, not elsewhere classified I87.321 Chronic venous hypertension (idiopathic) with inflammation of right lower extremity E11.622 Type 2 diabetes mellitus with other skin ulcer Facility Procedures : CPT4 Code: [id number] Description: 20254 - WOUND CARE VISIT-LEV 2 EST  PT Modifier: Quantity: 1 Electronic Signature(s) Signed: 08/03/2023 10:59:12 AM By: [name] RN Signed: 08/04/2023 7:39:18 AM By: Allen Derry PA-C Entered By: [name] on 08/03/2023 10:59:12

## 2023-08-06 DIAGNOSIS — N186 End stage renal disease: Secondary | ICD-10-CM | POA: Diagnosis not present

## 2023-08-06 DIAGNOSIS — Z992 Dependence on renal dialysis: Secondary | ICD-10-CM | POA: Diagnosis not present

## 2023-08-07 ENCOUNTER — Encounter (INDEPENDENT_AMBULATORY_CARE_PROVIDER_SITE_OTHER): Payer: Self-pay | Admitting: Vascular Surgery

## 2023-08-09 DIAGNOSIS — Z992 Dependence on renal dialysis: Secondary | ICD-10-CM | POA: Diagnosis not present

## 2023-08-09 DIAGNOSIS — N186 End stage renal disease: Secondary | ICD-10-CM | POA: Diagnosis not present

## 2023-08-10 ENCOUNTER — Encounter: Payer: Medicare HMO | Admitting: Physician Assistant

## 2023-08-10 DIAGNOSIS — E1122 Type 2 diabetes mellitus with diabetic chronic kidney disease: Secondary | ICD-10-CM | POA: Diagnosis not present

## 2023-08-10 DIAGNOSIS — I120 Hypertensive chronic kidney disease with stage 5 chronic kidney disease or end stage renal disease: Secondary | ICD-10-CM | POA: Diagnosis not present

## 2023-08-10 DIAGNOSIS — I87312 Chronic venous hypertension (idiopathic) with ulcer of left lower extremity: Secondary | ICD-10-CM | POA: Diagnosis not present

## 2023-08-10 DIAGNOSIS — Z79899 Other long term (current) drug therapy: Secondary | ICD-10-CM | POA: Diagnosis not present

## 2023-08-10 DIAGNOSIS — I890 Lymphedema, not elsewhere classified: Secondary | ICD-10-CM | POA: Diagnosis not present

## 2023-08-10 DIAGNOSIS — L97822 Non-pressure chronic ulcer of other part of left lower leg with fat layer exposed: Secondary | ICD-10-CM | POA: Diagnosis not present

## 2023-08-10 DIAGNOSIS — I87321 Chronic venous hypertension (idiopathic) with inflammation of right lower extremity: Secondary | ICD-10-CM | POA: Diagnosis not present

## 2023-08-10 DIAGNOSIS — E11622 Type 2 diabetes mellitus with other skin ulcer: Secondary | ICD-10-CM | POA: Diagnosis not present

## 2023-08-10 DIAGNOSIS — N184 Chronic kidney disease, stage 4 (severe): Secondary | ICD-10-CM | POA: Diagnosis not present

## 2023-08-10 NOTE — Progress Notes (Signed)
KONNOR, BOLITHO A ([id number]) [id number] Page 1 of 12 Visit Report for 08/10/2023 Chief Complaint Document Details Patient Name: Date of Service: [name], [name] 08/10/2023 10:15 A M Medical Record Number: [id number] Patient Account Number: [id number] Date of Birth/Sex: Treating RN: 15-May-1971 (52 y.o. [name]) [name] Primary Care Provider: [name] Other Clinician: Referring Provider: Treating Provider/Extender: [name] in Treatment: 25 Information Obtained from: Patient Chief Complaint Left LE Ulcer Electronic Signature(s) Signed: 08/10/2023 10:42:37 AM By: Allen Derry PA-C Entered By: Allen Derry on 08/10/2023 07:42:37 -------------------------------------------------------------------------------- HPI Details Patient Name: Date of Service: [name], [name] 08/10/2023 10:15 A M Medical Record Number: [id number] Patient Account Number: [id number] Date of Birth/Sex: Treating RN: [birth year] (52 y.o. [name] Primary Care Provider: [name] Other Clinician: Referring Provider: Treating Provider/Extender: [name] in Treatment: 25 History of Present Illness HPI Description: 12/28/17; this is a now 52 year old man who is a type II diabetic. He was hospitalized from 10/01/17 through 10/19/17. He had an MSSA soft tissue and skin infection. 2 open areas on the left leg were identified he has a smaller area on the left medial calf superiorly just below the knee and a wound just above the left ankle on the posterior medial aspect. I think both of these were surgical IandD sites when he was in the hospital. He was discharged with a wound VAC at that point however this is since been taken off. He follows with Dr. Sampson Goon for the [facility] and he is still on chronic Keflex at 500 twice a day. At that time he was hospitalized his hemoglobin A1c was 15.1 however if I'm reading his endocrinologist  notes correctly that is improved. He has been following with Dr. Lorretta Harp at vein and vascular and he has been applying calcium alginate and Unna boots. He has home health changing the dressing. They have also been attempting to get him external compression pumps although the patient is unaware whether they've been approved by insurance at this point. as mentioned he has a smaller clean wound on the right lateral calf just below the knee and he has a much larger area just above the left ankle medially and posteriorly. Our intake nurse reported greenish purulent looking drainage.the patient did have surgical material sent to pathology in February. This showed chronic abscess The patient also has lymphedema stage III in the left greater than right lower extremities. He has a history of blisters with wounds but these of all were always healed. The patient thinks that the lymphedema may have been present since he was about 52 years old i.e. about 30 years. He does not have graded pressure stockings and has not worn stockings. He does not have a distant history of DVT PE or phlebitis. He has not been systemically unwell fever no chills. He states that his Lasix is recently been reduced. He tells me his kidney function is at "30%" and he has been followed by Dr. Thedore Mins of nephrology. At one OSBORNE, COPUS A ([id number]) [id number] Page 2 of 12 point he was on Lasix 80 twice a day however that's been cut back and he is now on Lasix at 20 twice a day. The patient has a history of PAD listed in his records although he comes from Dr. Lorretta Harp I don't think is felt to have significant PAD. ABIs in our clinic were noncompressible bilaterally. 01/04/18; patient has a large wound on the left lateral lower calf and  a small wound on the left medial upper calf. He has been to see his nephrologist who changed him to Demadex 40 mg a day. I'm hopeful this will help with his systemic fluid overload.  He also has stage III lymphedema. Really no change in the 2 wounds since last week 01/11/18; the patient is down 13 pounds. He put his stage III lymphedema left leg in 4K compression last week and there is less edema fluid however we still haven't been able to communicate with home health but apparently it is [facility] but the dressings have not been changed. The patient noted an odor last week. He is also had compression pumps ordered by [facility] this as a not completed the paperwork stage. 01/18/18; patient continues to lose weight. Stage III lymphedema in the left greater than right leg under for alert compression. The major wound is on the left lateral ankle area. He apparently has bilateral compression pumps being brought to his house, these were ordered by [facility] Notable for the fact today he had some blisters on the right anterior leg together with some skin nodules. This is no doubt secondary to severe lymphedema. 01/25/18; the patient has obtained his compression pumps and is using them per vein and vascular instructions 3 times a day for an hour. He also saw [name] of vein and vascular. He was felt to have venous insufficiency but did not suggest any intervention also improved edema. It was suggested that he have compression stockings 20-30 mm on a daily basis in addition to compression pumps. The patient arrives in clinic today with a layer of unna under for layer compression. he seems to have some trouble with the degree of compression. He has open areas on the left lateral ankle area which is his major wound left upper medial calf and a superficial open area on the right anterior shin area which was blistered last week. He has skin changes on the right anterior calf which I think are no doubt secondary to lymphedema skin nodules etc. 02/01/18; the patient comes in telling us his nephrologist have to his torsemide. Unfortunately today he is put on 7 pounds by  our scales. He has blisters all over the anterior and medial part of his right calf and a new open wound. He also has soupy green drainage coming out of the left lateral calf /ankle wound. 02/08/18; culture I did last week of the left lateral ankle wound grew both Pseudomonas and Morganella. He is on Keflex from Dr. Sampson Goon in the hospital. I will need to review these notes.in any case Keflex is not going to cover these 2 organisms. I'm probably going to added ciprofloxacin today for 1 week. A lot of drainage that looks purulent last week. He is not complaining of pain however he has managed to put on 10 pounds in 2 weeks by our scales in this clinic. He is going to see his nephrologist tomorrow 02/15/18; he completed the ciprofloxacin I gave him last week. Notable that he is up to 379 pounds today which is up 16 pounds from 2 weeks ago. He is complaining of orthopnea but doesn't have any chest pain. 02/22/18; he continues to have weight gain. R intake nurse reports again purulent green drainage coming out of the lateral wound on the lateral left calf. He has small open area on the right anterior leg. His torsemide was increased to 2 tablets a day I believe this is 40 mg last week  in response to the call admitted to Dr. Doristine Church office. He follows up with Dr. Thedore Mins and Dr. Gilda Crease tomorrow 03/01/18 his weight essentially stable today at 383 pounds. Drainage out of the left lateral wound on the lower left calf/ankle is a lot less. Culture last time grew Pseudomonas. I put him on cefdinir. He has been to Dr. Doristine Church office no adjustments in his diuretics. Dr. Dion Saucier prescribed a wraparound stocking for the right leg.there is no open area on the right leg. He has a superficial area on the left medial calf, left posterior calf and in the large area on the left lateral however this looks better 03/15/18; weight is not up to 393 pounds. He saw his nephrologist yesterday Dr. Thedore Mins will increase the Demadex I'm  hopeful this will help with the edema control. I'm using silver alginate to all his wounds. In particular the left lateral ankle looks better. Unfortunately he has new open areas on the right lateral calf that will include use of his compression stocking at least in the short to medium term. He has new wounds 3 on the right lateral calf. One of these has some size however all numerous superficial 03/22/18; his weight is stabilized a bit. Just adjustment of his diuretics by his nephrologist. There is no doubt he has some degree of systemic fluid overload on top of severe left greater than right lymphedema. 2 weeks ago tried to transition him to stockings on the right leg however he developed re-breakdown of skin on the right leg and we had to put him back in compression last week. He also uses external compression pumps and claims to be compliant Continued concern about his depression today 03/29/18; several ongoing issues with this patient; He no longer has home health coverage apparently secondary to a lapse in insurance. He is apparently transitioning from short for long-term disability. [facility] was changing his compression wraps on Monday and Friday. He has gained 10 pounds since last week Sees Dr. Lorretta Harp tomorrow Saw his primary doctor last week about the depression. It sounds as though he declined pharmacologic management. He seems somewhat better today. We were concerned last week when he came. Somewhat better today He is using his compression pumps once a day at home, I have asked for twice a day if possible especially on the left leg Follows up with his nephrologist in mid-August. He is managing his diuretic for I think stage IV chronic renal failure Paradoxically his wounds actually look better 04/19/18; the patient has not been seen since I last saw him 3 weeks ago. He saw Dr. Gilda Crease of vascular surgery on 03/30/18 I believe he put him in a 20/30 stocking bilaterally with a  wraparound extremitease stocking. He has not been putting anything specifically on the wound. More problematic than that he has not been wearing the stockings he is at home. He has been using his external compression pumps once per day according to him on a rare occasion twice On a psychosocial level the patient is now on long-term disability and is applying for COBRA therefore he is between insurances. He has not been able to follow up with Dr. Thedore Mins who is his nephrologist as a result. As noted his weight is up to 407 pounds today. He promises me he'll follow-up with Dr. Thedore Mins 05/03/18; he hasn't been here in 2 weeks now. He apparently has been wearing a compression sock on the left leg. Massive increase in edema 3 large open wounds on the  left anterior leg that were probably blisters. Significant deterioration in the left lateral ankle wound that we've been doing as his most problematic wound. He still does not have his insurance issues wrapped up. His weight is well over 400 pounds. 05/10/18; arrives today with better looking edema control in the left leg. He has been using his compression pumps twice a day. We also wrapped it is left leg for there he's been using his pumps so there is much better edema control. Most of new wounds from last week look a lot better. Even the refractory area on the lower left lateral ankle looks a lot better to me today. He follows up with Dr. Thedore Mins this morning [nephrology] 05/17/18 patient arrives today with a lot less edema in the left leg. His weight is gone down 7 pounds. He tells me he saw Dr. Thedore Mins but his torsemide was not adjusted. He is using the palms 3 times a day. There is been quite an improvement in the remaining wound on the left lateral ankle Weekly visit for follow-up of bilateral lower extremity wounds related to severe lymphedema and probably some degree of systemic fluid overload from chronic renal failure stage IV. His weight is up this week to over  400 pounds. He noted increasing edema in the right leg late last week. He took his compression stocking off he did not increase the frequency of this compression pump use. He developed a large blister on the back of the right calf. He also has a new opened blister on the left lateral calf in addition to the wound that we've been using on the distal left lower calf area and we've been using silver alginate. He tells me that he has an ultrasound which is a DVT rule out and I think a reflux study ordered by Dr. Lorretta Harp 05/31/18; weekly visit. He went to see vein and vascular this week apparently they remove the 4-layer compression on the left and put an Unna boot on him in replacement. He's got more swelling in the left leg is resolved. That being said his left lower Wound is better. He still has a fairly large wound on the right posterior calf this was not disturbed. He has a follow-up appointment with Dr. Doristine Church nephrologist next week 06/07/2018; the patient arrives today with a history that he took both his compression wraps off 3 days ago in order to take a shower. He has bilateral severe tense blisters. A lot of weeping erythema especially on the lateral left calf. Almost circumferential blisters on the right. Multiple areas of epithelial breakdown. He does not complain of any pain fever chills. He states he has been using his compression pump in some form of stocking that I could not really determine the type. He did not come into the clinic with anything on his legs. He tells me he has an appointment with Dr. Ella Jubilee his nephrologist I believe this Friday. Paradoxically his weight is actually down to 385 pounds I believe last week he was over 400 06/14/18; arrives with better looking wound surfaces today on both legs. There is no further blistering however there are still areas that aren't epithelialized on the right anterior, right lateral, left posterior and left medial. His original wound just above  the left ankle laterally is still open moist. His weight was about the same today. He tells me that his nephrologist increase the torsemide from 40/20/09/1939/40 twice a day Genrich, Axyl A ([id number]) [id number] Page 3 of 12 06/21/18; both  his wraps fell down to his mid calf. Has a result he has multiple blisters across the anterior right leg above with the wraps ultimately watched. He also has blisters on the posterior part of the left calf 2. His original wound on the left lateral calf is hard to see any open area. There is however a divot. Which is going to be difficult to deal with into the future until the edema in the left leg is controlled. 2 small superficial areas remain He tells [place] his weight was 386 pounds on his scale at home. According to our scale he is up 5 pounds. He is not on a fluid restriction 06/28/18; patient's weight is gone up 2 pounds since last time. He has weeping edema and open superficial wounds on the right posterior right lateral and right anterior calf. He has a small open area on the left anterior probably left posterior calf and the original wound on the left lateral calf appears to be just about closed He is being planned for a dialysis shunt in the right arm through [facility] incoordination with his nephrologist Dr. Thedore Mins He claims to be using his compression pumps twice a day. He has lymphedema and no doubt systemic fluid volume overload from stage IV chronic renal failure 07/04/18; patient's weight is up into the 396 range. He is supposed to see his cardiologist tomorrow and plans are being made for a shunt by Dr. Gilda Crease. He still has significant open wounds/draining areas on the right anterior and right posterior calf. Several small open areas on the left which are less in terms of wound area on the right which is surprising given the fact the left is the larger most lymphedematous leg 07/26/2018 She is seen today for  follow-up and management of lateral posterior lower leg wound and bilateral lymphoedema. He has a very flat affect and depressed mood today. Unable to elicit much information from him today. No thoughts of harm to self identified. Recently had a follow-up with vascular vein for fistula placement to initiate dialysis in the right arm. He has 2 dressings on the right arm from the fistula procedure with no bleeding or drainage noted on the dressing. He does have a large amount of bruising in the surrounding to puncture areas on the right lower arm from the fistula placement. No pain elicited with palpation of the right arm. He denies a diminished sensation in that right lower arm as well. He does have a small wound to the left lateral posterior lower leg. No visual drainage present to either leg. However the wound to the left lower leg does have a foul odor even after cleaning it. The left is the larger most lymphedematous leg with the right Leg still having a significant amount of edema. No drainage or blisters present on either leg today. He states that he is using the lymphedema pumps twice a day. Not too sure if he is compliant with actually using the lymphedema pumps based on the amount of edema that he has in his legs. Weight increased from 396.1 to 401.6. No recent fevers, chills, or shortness of breath. 08/02/18; the patient only has one remaining wound on the left lateral calf which is still open. This is in the resultant [place] shaped area which was once the site of the major wound. He does not have any open areas on the right leg nor additional wounds on the left no blistering. As usual the left leg is much larger than  the right. He comes in today stating that he took the wraps off on the right leg on Friday and since then he has presumably been wearing his stocking which is a wraparound variant stocking on the right. States he took the 4-layer compression off his left leg this morning to shower.  He is seeing vein and vascular tomorrow. He states he is using his compression pumps once or twice a day. He also admits that he has been on his feet a lot 08/09/18; the patient has the left leg healed except for the original wound site on the left medial Just above the ankle. On the right he has a new wound on the right lateral calf. He saw vein and vascular last week and he is been back in his pressure stockings and wraparound stockings. He also is been put on metolazone 3 times a week by nephrology along with his torsemide. I'm hopeful that this will help with some of the lower extremity edema. I've always felt that he has lymphedema but there may be a secondary component contributing 08/16/18; the patient used his own junk slight stockings to both legs last week. He claims he is pumping once to twice a day at home. Here rise with his legs looking visibly less edematous. His usual left greater than right secondary to lymphedema. He is tolerating the metolazone 3 times a week along with the torsemide. His weight was down 2 pounds. He follows up with nephrology soon for follow-up lab work I think in early January. He does not have any open areas on the right leg he still has the crevice on the lateral left calf just above the ankle there is 2 small open areas here I am not sure that this is ever going to be free of an open area although certainly not worsening. More problematic lead he has 2 blistered areas just above the wrist on the left lateral calf 09/06/2018; the patient arrives with much larger legs than I remember seeing a month ago especially on the right. He has 1 open area on the left lateral calf and he had a 4 layer compression on this on arrival in the clinic. This was apparently put on by [facility] in response to a new open wound. He claims he is pumping once or more often twice a day although I really have a hard time believing it. He came in with nothing but stockings on his legs. There is  tremendous increase in the edema bilaterally 1/22; the patient only has the one area that is not totally closed and that is the divot on the left lateral calf. I do not think we are going to be able to do anything further to this unless he loses edema fluid in his left leg when he starts dialysis which I gather is sometime soon. He is using his own wraparound compression garment. He is using the compression pumps twice a day. 2/19; the patient does not have any open wounds on the right leg but both legs left and the right have extensive edema is usually worse on the left. He has a tense blister on the left anterior leg. Using his compression pumps usually once a day per his description. He does not come in with the compression stockings 2/26. Right leg remains without any open wounds that he has his own stocking. The tense blister on the left anterior leg is closed over. He still has the open area on the left lateral mid calf and  then the divot injury area which I do not think is going to heal just above the left ankle. 3/11; the patient arrives with all the wounds on both legs healed. He has his stocking with the wraparound juxta lite on the right. He has 1 for the left. He initiated dialysis on Monday and we will see what effect that has on his lower extremity edema. He has his compression pumps and he plans to use them at least once a day Readmission 01/20/2022 Mr. Costlow Waychoff is a 52 year old male with a past medical history of type 1 diabetes on hemodialysis that presents to the clinic for a 1 year history of wound to the right side of his neck. He states that he had a central line in place when he started hemodialysis while his graft matured. He states that the area where the line was removed never healed. He reports serosanguineous drainage and odor to the area over the past year. He has not taken antibiotics for this issue. He currently denies systemic signs of infection. He currently keeps  the area covered with a Band-Aid. 5/31; patient presents for follow-up. He has been taking doxycycline without issues. He had a culture done at last clinic visit that showed abundant Streptococcus anginosus sensitive to penicillin. I will go ahead and switch him to Augmentin. He has been using mupirocin ointment to the wound bed and covering this with Hydrofera Blue. Upon inspection today there is a piece of dressing tightly adhered in the wound bed that appears to be connected to underlying structures. He obtains his neck ultrasound tomorrow. He denies systemic signs of infection. 6/14; Patient presents for follow-up. Patient had an ultrasound to his neck that showed a prior PermCath still present is a retained foreign body. This was causing his wound not to heal. On 6/7 the foreign body was removed by Dr.Schnier. Since then the area has healed up nicely. He has no issues or complaints today. He denies any drainage, open wounds or discomfort to the previous wound site. 02/16/2023 Mr. Izac Pule is a 52 year old male with a past medical history of end-stage renal disease on hemodialysis, Type 2 diabetes, And lymphedema/venous insufficiency That presents to the clinic for evaluation of his right lower extremity. He has wounds to his left lower extremity for several months but these are being managed by vein and vascular with a wound VAC. On 02/04/2023 patient was evaluated for new blisters to the right lower extremity and potential cellulitis at vein and vascular and was recommended to go to the ED. He was started on oral antibiotics by the ED and referred to our clinic for follow-up to assure that the right leg wounds improved. He had an Unna boot placed by vein and vascular two days prior. He has home health that changes the unna boot and wound vac 2 times weekly. He currently denies signs of infection. There are no open wounds to the right lower extremity. 7/10; Patient was seen last month for left  lower extremity wounds however this was in the care of vein and vascular and I recommended he discuss with his doctor if he would like to transfer care to the wound care center. At this time patient states that he would like to follow here. He has been using a wound VAC to the left lower extremity large wound and he has been keeping the area covered to the dorsal left foot wound. He would like to stop the wound VAC as He has had trouble  with the seal. He currently denies systemic signs of infection. 7/17; patient presents for follow-up. We have been using Dakin's wet-to-dry dressings under Kerlix/Coban to the left lower extremity large wound and Silver alginate to the left dorsal foot wound. Wounds are smaller. Patient has no issues or complaints. 7/24; patient presents for follow-up. We have been using Dakin's wet-to-dry dressings to the larger wound and silver alginate to the smaller dorsal foot wound to the left lower extremity all under Kerlix/Coban. Wounds are smaller today. Patient has no issues or complaints. Originally patient had a wound VAC. He still has this in his possession but we are not using it. I recommended He returned this. He would also like new lymphedema pumps. GUMECINDO, KEHOE A ([id number]) [id number] Page 4 of 12 7/31; patient presents for follow-up. We have been using Dakin's wet-to-dry dressings under Kerlix/Coban to the left lower extremity larger wound and silver alginate to the smaller dorsal left foot wound. He was noted to have a fever during dialysis and was found to be bacteremic. He has been in the hospital for the past few days. He is receiving IV cefazolin during dialysis now. He has no issues or complaints about the wounds. The left dorsal foot wound is healed today. He currently denies systemic signs of infection. 8/7; patient presents for follow-up. We have been using silver alginate under Kerlix/Coban to the left lower extremity. Wound is  smaller. 8/14; this patient has a reasonably large wound on the left leg. Dimensions are marginally impaired. He has chronic venous insufficiency and his edema is poorly controlled. We have been using kerlix Coban compression. Patient also has external compression pumps at home but he has not been using them. He also has dialysis Tuesday Thursdays and Saturdays 8/21; patient presents for follow-up. We have been using silver cell under compression therapy. The compression wrap was increased to 3 layer at last clinic visit. He states he tolerated this well. He has no issues or complaints today. Wound is smaller. 8/28; patient presents for follow-up. We have been using silver alginate under compression therapy to the left lower extremity. Wound appears well-healing. Unfortunately has developed another wound just lateral to this that looks like it started out as a blister. 9/4; patient presents for follow-up. We have been using silver alginate under compression therapy to the left lower extremity. Wound is smaller. New wound at last clinic visit on the lateral aspect has closed. 9/11; patient presents for follow-up. We have been using silver alginate under compression therapy to the left lower extremity. Wound continues to become smaller. 9/18; patient with longstanding chronic lymphedema. Wound on the left medial lower leg. We have been using silver alginate under Urgo K2 lite. The wound is improved in terms of appearance and measurements per our intake nurse 9/25; chronic longstanding lymphedema. Wound on the left medial lower leg. Measurements are better this week. Last week I think I changed him to [facility] still under and Urgo K2 lite 10/2; chronic longstanding lymphedema with a weak wound on the left anterior medial lower leg. Not much change in dimension this week slightly hyper granulated. I thought I had changed him to [facility] however according to our intake nurse he has been  receiving silver alginate. We are using an Urgo K2 light compression because TBI's on the left were only 0.33 is ABIs were noncompressible. He follows with vein and vascular for his arterial insufficiency 10/9; wound is measuring smaller on the left anterior lower leg. We have been  using Hydrofera Blue under Urgo K2 compression. He tells me that he is getting/has received new compression pumps I have asked him to use it 1 hour twice a day even over our compression wraps. 10/16; we have been using Hydrofera Blue under Urgo K2 compression. Wound is measuring smaller and looks reasonably healthy. Unfortunately he did not get a chance to consistently use the compression pumps twice a day. 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 Furthermore he works nights 10/23; Hydrofera Blue under Urgo K2 compression for a wound on his left medial lower leg. The wound measures smaller today. Noticeable improvement in the edema he tells me he is using his external compression pumps once a day at least sometimes twice. He has a difficult work schedule and works nights 10/30; we are using Hydrofera Blue under Urgo K2 compression. Wound continues to gradually get smaller. He is using his compression pumps at home once a day 07-06-2023 upon evaluation today patient appears to be doing well currently in regard to his wound. This is actually showing signs of excellent epithelization and granulation at this point. Fortunately I do not see any evidence of worsening overall and I do believe that the patient is making good headway here towards closure. 07-11-2023 upon evaluation today patient appears to be doing well currently in regard to his wound. He does seem to be somewhat drying the skin around his as well I think that we may want to switch to Tubigrip since he does well with this and subsequently switch to collagen as well which should allow this to be able to heal faster with the benefit of  the collagen. 07-20-2023 upon evaluation today patient's wound unfortunately is showing signs of being a bit worse. He notes that he is having some issues here with areas that he scratch that are now open. This is definitely not what we are looking for. I discussed with him that he needs to make sure that he is not scratching these areas. The region is scar tissue and this means that he is gena have a lot of issues if he scratches it with skin breakdown which is not what we are looking for at all. The patient voiced understanding. 07-27-2023 upon evaluation patient appears to be doing excellent in regard to his leg ulcer I feel like he is getting very close to complete resolution. Fortunately I do not see any evidence of worsening overall and I believe that the patient is making really good headway here towards closure which is great news. 08-10-2023 upon evaluation today patient appears to be doing well currently in regard to his leg ulcer. Has been tolerating the dressing changes without complication and the good news is he actually is doing quite well in fact he appears to be pretty much completely healed. I do not see any signs of infection at this time. Electronic Signature(s) Signed: 08/10/2023 10:56:43 AM By: Allen Derry PA-C Entered By: Allen Derry on 08/10/2023 07:56:43 -------------------------------------------------------------------------------- Physical Exam Details Patient Name: Date of Service: [name], [name] 08/10/2023 10:15 A KRISEAN, CRAVER A ([id number]) [id number] Page 5 of 12 Medical Record Number: [id number] Patient Account Number: [id number] Date of Birth/Sex: Treating RN: 1970-12-18 (52 y.o. [name] Primary Care Provider: [name] Other Clinician: Referring Provider: Treating Provider/Extender: [name] in Treatment: 25 Constitutional Obese and well-hydrated in no acute  distress. Respiratory normal breathing without difficulty. Psychiatric this patient  is able to make decisions and demonstrates good insight into disease process. Alert and Oriented x 3. pleasant and cooperative. Notes Upon inspection I did not see any signs of opening at this point I feel like that he is actually making good headway towards closure there does appear to be a brand-new area right in the middle of the wound that has not completely sealed 100% but he is extremely close. In fact it might even be brand-new skin is difficult to tell this is just a 0.1 area. Electronic Signature(s) Signed: 08/10/2023 10:57:07 AM By: Allen Derry PA-C Entered By: Allen Derry on 08/10/2023 07:57:07 -------------------------------------------------------------------------------- Physician Orders Details Patient Name: Date of Service: [name], [name] 08/10/2023 10:15 A M Medical Record Number: [id number] Patient Account Number: [id number] Date of Birth/Sex: Treating RN: 02/18/1971 (52 y.o. [name]) [name] Primary Care Provider: [name] Other Clinician: Referring Provider: Treating Provider/Extender: [name] in Treatment: 25 The following information was scribed by: [name] The information was scribed for: Allen Derry Verbal / Phone Orders: No Diagnosis Coding ICD-10 Coding Code Description [phone] Non-pressure chronic ulcer of other part of left lower leg with fat layer exposed I87.312 Chronic venous hypertension (idiopathic) with ulcer of left lower extremity I89.0 Lymphedema, not elsewhere classified I87.321 Chronic venous hypertension (idiopathic) with inflammation of right lower extremity E11.622 Type 2 diabetes mellitus with other skin ulcer Follow-up Appointments Return Appointment in 1 week. Home Health Home Health Company: [name] [phone] [facility] for wound care. May utilize formulary equivalent dressing for wound treatment  orders unless otherwise specified. Home Health Nurse may visit PRN to address patients wound care needs. [name] [phone] Scheduled days for dressing changes to be completed; exception, patient has scheduled wound care visit that day. **Please direct any NON-WOUND related issues/requests for orders to patient's Primary Care Physician. **If current dressing causes regression in wound condition, may D/C ordered dressing product/s and apply Normal Saline Moist Dressing daily until next Wound Healing Center or Other MD appointment. **Notify Wound Healing Center of regression in wound condition at [phone]. Bathing/ Shower/ Hygiene May shower with wound dressing protected with water repellent cover or cast protector. ELIASON, MEMBRENO A ([id number]) [id number] Page 6 of 12 No tub bath. Anesthetic (Use 'Patient Medications' Section for Anesthetic Order Entry) Lidocaine applied to wound bed Wound Treatment Wound #22 - Lower Leg Wound Laterality: Left Cleanser: Soap and Water 3 x Per Week/30 Days Discharge Instructions: Gently cleanse wound with antibacterial soap, rinse and pat dry prior to dressing wounds Cleanser: Wound Cleanser 3 x Per Week/30 Days Discharge Instructions: Wash your hands with soap and water. Remove old dressing, discard into plastic bag and place into trash. Cleanse the wound with Wound Cleanser prior to applying a clean dressing using gauze sponges, not tissues or cotton balls. Do not scrub or use excessive force. Pat dry using gauze sponges, not tissue or cotton balls. Prim Dressing: Xeroform-HBD 2x2 (in/in) 3 x Per Week/30 Days ary Discharge Instructions: Apply Xeroform-HBD 2x2 (in/in) as directed Secondary Dressing: ABD Pad 5x9 (in/in) 3 x Per Week/30 Days Discharge Instructions: Cover with ABD pad Secured With: Tubigrip Size D, 3x10 (in/yd) 3 x Per Week/30 Days Electronic Signature(s) Signed: 08/10/2023 11:09:24 AM By: [name]  RN Signed: 08/10/2023 7:54:12 PM By: Allen Derry PA-C Entered By: [name] on 08/10/2023 08:09:23 -------------------------------------------------------------------------------- Problem List Details Patient Name: Date of Service: [name], [name] 08/10/2023 10:15 A M Medical Record Number: [id number] Patient  Account Number: [id number] Date of Birth/Sex: Treating RN: [birth year] (52 y.o. [name]) [name] Primary Care Provider: [name] Other Clinician: Referring Provider: Treating Provider/Extender: [name] in Treatment: 25 Active Problems ICD-10 Encounter Code Description Active Date MDM Diagnosis [phone] Non-pressure chronic ulcer of other part of left lower leg with fat layer exposed6/19/2024 No Yes I87.312 Chronic venous hypertension (idiopathic) with ulcer of left lower extremity 02/16/2023 No Yes I89.0 Lymphedema, not elsewhere classified 03/23/2023 No Yes I87.321 Chronic venous hypertension (idiopathic) with inflammation of right lower 02/16/2023 No Yes extremity E11.622 Type 2 diabetes mellitus with other skin ulcer 02/16/2023 No Yes Makris, Stanlee A ([id number]) [id number] Page 7 of 12 Inactive Problems ICD-10 Code Description Active Date Inactive Date L97.522 Non-pressure chronic ulcer of other part of left foot with fat layer exposed 03/16/2023 03/16/2023 Resolved Problems Electronic Signature(s) Signed: 08/10/2023 10:39:52 AM By: Allen Derry PA-C Entered By: Allen Derry on 08/10/2023 07:39:52 -------------------------------------------------------------------------------- Progress Note Details Patient Name: Date of Service: [name], [name] 08/10/2023 10:15 A M Medical Record Number: [id number] Patient Account Number: [id number] Date of Birth/Sex: Treating RN: Jan 19, 1971 (52 y.o. [name] Primary Care Provider: [name] Other Clinician: Referring Provider: Treating Provider/Extender: [name] in Treatment: 25 Subjective Chief Complaint Information obtained from Patient Left LE Ulcer History of Present Illness (HPI) 12/28/17; this is a now 52 year old man who is a type II diabetic. He was hospitalized from 10/01/17 through 10/19/17. He had an MSSA soft tissue and skin infection. 2 open areas on the left leg were identified he has a smaller area on the left medial calf superiorly just below the knee and a wound just above the left ankle on the posterior medial aspect. I think both of these were surgical IandD sites when he was in the hospital. He was discharged with a wound VAC at that point however this is since been taken off. He follows with Dr. Sampson Goon for the [facility] and he is still on chronic Keflex at 500 twice a day. At that time he was hospitalized his hemoglobin A1c was 15.1 however if I'm reading his endocrinologist notes correctly that is improved. He has been following with Dr. Lorretta Harp at vein and vascular and he has been applying calcium alginate and Unna boots. He has home health changing the dressing. They have also been attempting to get him external compression pumps although the patient is unaware whether they've been approved by insurance at this point. as mentioned he has a smaller clean wound on the right lateral calf just below the knee and he has a much larger area just above the left ankle medially and posteriorly. Our intake nurse reported greenish purulent looking drainage.the patient did have surgical material sent to pathology in February. This showed chronic abscess The patient also has lymphedema stage III in the left greater than right lower extremities. He has a history of blisters with wounds but these of all were always healed. The patient thinks that the lymphedema may have been present since he was about 52 years old i.e. about 30 years. He does not have graded pressure stockings and has not worn stockings. He does not have a  distant history of DVT PE or phlebitis. He has not been systemically unwell fever no chills. He states that his Lasix is recently been reduced. He tells me his kidney function is at "30%" and he has been followed by Dr. Thedore Mins of nephrology. At one point he was on  Lasix 80 twice a day however that's been cut back and he is now on Lasix at 20 twice a day. The patient has a history of PAD listed in his records although he comes from Dr. Lorretta Harp I don't think is felt to have significant PAD. ABIs in our clinic were noncompressible bilaterally. 01/04/18; patient has a large wound on the left lateral lower calf and a small wound on the left medial upper calf. He has been to see his nephrologist who changed him to Demadex 40 mg a day. I'm hopeful this will help with his systemic fluid overload. He also has stage III lymphedema. Really no change in the 2 wounds since last week 01/11/18; the patient is down 13 pounds. He put his stage III lymphedema left leg in 4K compression last week and there is less edema fluid however we still haven't been able to communicate with home health but apparently it is [facility] but the dressings have not been changed. The patient noted an odor last week. He is also had compression pumps ordered by [facility] this as a not completed the paperwork stage. 01/18/18; patient continues to lose weight. Stage III lymphedema in the left greater than right leg under for alert compression. The major wound is on the left lateral ankle area. He apparently has bilateral compression pumps being brought to his house, these were ordered by [facility] Notable for the fact today he had some blisters on the right anterior leg together with some skin nodules. This is no doubt secondary to severe lymphedema. 01/25/18; the patient has obtained his compression pumps and is using them per vein and vascular instructions 3 times a day for an hour. He also saw [name] of vein  and vascular. He was felt to have venous insufficiency but did not suggest any intervention also improved edema. It was suggested that he have Patil, Tristian A ([id number]) [id number] Page 8 of 12 compression stockings 20-30 mm on a daily basis in addition to compression pumps. The patient arrives in clinic today with a layer of unna under for layer compression. he seems to have some trouble with the degree of compression. He has open areas on the left lateral ankle area which is his major wound left upper medial calf and a superficial open area on the right anterior shin area which was blistered last week. He has skin changes on the right anterior calf which I think are no doubt secondary to lymphedema skin nodules etc. 02/01/18; the patient comes in telling us his nephrologist have to his torsemide. Unfortunately today he is put on 7 pounds by our scales. He has blisters all over the anterior and medial part of his right calf and a new open wound. He also has soupy green drainage coming out of the left lateral calf /ankle wound. 02/08/18; culture I did last week of the left lateral ankle wound grew both Pseudomonas and Morganella. He is on Keflex from Dr. Sampson Goon in the hospital. I will need to review these notes.in any case Keflex is not going to cover these 2 organisms. I'm probably going to added ciprofloxacin today for 1 week. A lot of drainage that looks purulent last week. He is not complaining of pain however he has managed to put on 10 pounds in 2 weeks by our scales in this clinic. He is going to see his nephrologist tomorrow 02/15/18; he completed the ciprofloxacin I gave him last week. Notable that he is up to 379  pounds today which is up 16 pounds from 2 weeks ago. He is complaining of orthopnea but doesn't have any chest pain. 02/22/18; he continues to have weight gain. R intake nurse reports again purulent green drainage coming out of the lateral wound on the  lateral left calf. He has small open area on the right anterior leg. His torsemide was increased to 2 tablets a day I believe this is 40 mg last week in response to the call admitted to Dr. Doristine Church office. He follows up with Dr. Thedore Mins and Dr. Gilda Crease tomorrow 03/01/18 his weight essentially stable today at 383 pounds. Drainage out of the left lateral wound on the lower left calf/ankle is a lot less. Culture last time grew Pseudomonas. I put him on cefdinir. He has been to Dr. Doristine Church office no adjustments in his diuretics. Dr. Dion Saucier prescribed a wraparound stocking for the right leg.there is no open area on the right leg. He has a superficial area on the left medial calf, left posterior calf and in the large area on the left lateral however this looks better 03/15/18; weight is not up to 393 pounds. He saw his nephrologist yesterday Dr. Thedore Mins will increase the Demadex I'm hopeful this will help with the edema control. I'm using silver alginate to all his wounds. In particular the left lateral ankle looks better. Unfortunately he has new open areas on the right lateral calf that will include use of his compression stocking at least in the short to medium term. He has new wounds 3 on the right lateral calf. One of these has some size however all numerous superficial 03/22/18; his weight is stabilized a bit. Just adjustment of his diuretics by his nephrologist. There is no doubt he has some degree of systemic fluid overload on top of severe left greater than right lymphedema. 2 weeks ago tried to transition him to stockings on the right leg however he developed re-breakdown of skin on the right leg and we had to put him back in compression last week. He also uses external compression pumps and claims to be compliant Continued concern about his depression today 03/29/18; several ongoing issues with this patient; He no longer has home health coverage apparently secondary to a lapse in insurance. He is  apparently transitioning from short for long-term disability. [facility] was changing his compression wraps on Monday and Friday. He has gained 10 pounds since last week Sees Dr. Lorretta Harp tomorrow Saw his primary doctor last week about the depression. It sounds as though he declined pharmacologic management. He seems somewhat better today. We were concerned last week when he came. Somewhat better today He is using his compression pumps once a day at home, I have asked for twice a day if possible especially on the left leg Follows up with his nephrologist in mid-August. He is managing his diuretic for I think stage IV chronic renal failure Paradoxically his wounds actually look better 04/19/18; the patient has not been seen since I last saw him 3 weeks ago. He saw Dr. Gilda Crease of vascular surgery on 03/30/18 I believe he put him in a 20/30 stocking bilaterally with a wraparound extremitease stocking. He has not been putting anything specifically on the wound. More problematic than that he has not been wearing the stockings he is at home. He has been using his external compression pumps once per day according to him on a rare occasion twice On a psychosocial level the patient is now on long-term disability and is  applying for COBRA therefore he is between insurances. He has not been able to follow up with Dr. Thedore Mins who is his nephrologist as a result. As noted his weight is up to 407 pounds today. He promises me he'll follow-up with Dr. Thedore Mins 05/03/18; he hasn't been here in 2 weeks now. He apparently has been wearing a compression sock on the left leg. Massive increase in edema 3 large open wounds on the left anterior leg that were probably blisters. Significant deterioration in the left lateral ankle wound that we've been doing as his most problematic wound. He still does not have his insurance issues wrapped up. His weight is well over 400 pounds. 05/10/18; arrives today with better looking edema  control in the left leg. He has been using his compression pumps twice a day. We also wrapped it is left leg for there he's been using his pumps so there is much better edema control. Most of new wounds from last week look a lot better. Even the refractory area on the lower left lateral ankle looks a lot better to me today. He follows up with Dr. Thedore Mins this morning [nephrology] 05/17/18 patient arrives today with a lot less edema in the left leg. His weight is gone down 7 pounds. He tells me he saw Dr. Thedore Mins but his torsemide was not adjusted. He is using the palms 3 times a day. There is been quite an improvement in the remaining wound on the left lateral ankle Weekly visit for follow-up of bilateral lower extremity wounds related to severe lymphedema and probably some degree of systemic fluid overload from chronic renal failure stage IV. His weight is up this week to over 400 pounds. He noted increasing edema in the right leg late last week. He took his compression stocking off he did not increase the frequency of this compression pump use. He developed a large blister on the back of the right calf. He also has a new opened blister on the left lateral calf in addition to the wound that we've been using on the distal left lower calf area and we've been using silver alginate. He tells me that he has an ultrasound which is a DVT rule out and I think a reflux study ordered by Dr. Lorretta Harp 05/31/18; weekly visit. He went to see vein and vascular this week apparently they remove the 4-layer compression on the left and put an Unna boot on him in replacement. He's got more swelling in the left leg is resolved. That being said his left lower Wound is better. He still has a fairly large wound on the right posterior calf this was not disturbed. He has a follow-up appointment with Dr. Doristine Church nephrologist next week 06/07/2018; the patient arrives today with a history that he took both his compression wraps off 3 days  ago in order to take a shower. He has bilateral severe tense blisters. A lot of weeping erythema especially on the lateral left calf. Almost circumferential blisters on the right. Multiple areas of epithelial breakdown. He does not complain of any pain fever chills. He states he has been using his compression pump in some form of stocking that I could not really determine the type. He did not come into the clinic with anything on his legs. He tells me he has an appointment with Dr. Ella Jubilee his nephrologist I believe this Friday. Paradoxically his weight is actually down to 385 pounds I believe last week he was over 400 06/14/18; arrives with better looking  wound surfaces today on both legs. There is no further blistering however there are still areas that aren't epithelialized on the right anterior, right lateral, left posterior and left medial. His original wound just above the left ankle laterally is still open moist. His weight was about the same today. He tells me that his nephrologist increase the torsemide from 40/20/09/1939/40 twice a day 06/21/18; both his wraps fell down to his mid calf. Has a result he has multiple blisters across the anterior right leg above with the wraps ultimately watched. He also has blisters on the posterior part of the left calf 2. His original wound on the left lateral calf is hard to see any open area. There is however a divot. Which is going to be difficult to deal with into the future until the edema in the left leg is controlled. 2 small superficial areas remain He tells [place] his weight was 386 pounds on his scale at home. According to our scale he is up 5 pounds. He is not on a fluid restriction 06/28/18; patient's weight is gone up 2 pounds since last time. He has weeping edema and open superficial wounds on the right posterior right lateral and right anterior calf. He has a small open area on the left anterior probably left posterior calf and the original wound on  the left lateral calf appears to be just about closed He is being planned for a dialysis shunt in the right arm through vein and vascular incoordination with his nephrologist Dr. Thedore Mins He claims to be using his compression pumps twice a day. He has lymphedema and no doubt systemic fluid volume overload from stage IV chronic renal failure 07/04/18; patient's weight is up into the 396 range. He is supposed to see his cardiologist tomorrow and plans are being made for a shunt by Dr. Gilda Crease. He still has significant open wounds/draining areas on the right anterior and right posterior calf. Several small open areas on the left which are less in terms of wound area on the right which is surprising given the fact the left is the larger most lymphedematous leg Zamudio, Kutler A ([id number]) [id number] Page 9 of 12 07/26/2018 She is seen today for follow-up and management of lateral posterior lower leg wound and bilateral lymphoedema. He has a very flat affect and depressed mood today. Unable to elicit much information from him today. No thoughts of harm to self identified. Recently had a follow-up with vascular vein for fistula placement to initiate dialysis in the right arm. He has 2 dressings on the right arm from the fistula procedure with no bleeding or drainage noted on the dressing. He does have a large amount of bruising in the surrounding to puncture areas on the right lower arm from the fistula placement. No pain elicited with palpation of the right arm. He denies a diminished sensation in that right lower arm as well. He does have a small wound to the left lateral posterior lower leg. No visual drainage present to either leg. However the wound to the left lower leg does have a foul odor even after cleaning it. The left is the larger most lymphedematous leg with the right Leg still having a significant amount of edema. No drainage or blisters present on either leg today. He  states that he is using the lymphedema pumps twice a day. Not too sure if he is compliant with actually using the lymphedema pumps based on the amount of edema that he has in his legs.  Weight increased from 396.1 to 401.6. No recent fevers, chills, or shortness of breath. 08/02/18; the patient only has one remaining wound on the left lateral calf which is still open. This is in the resultant [place] shaped area which was once the site of the major wound. He does not have any open areas on the right leg nor additional wounds on the left no blistering. As usual the left leg is much larger than the right. He comes in today stating that he took the wraps off on the right leg on Friday and since then he has presumably been wearing his stocking which is a wraparound variant stocking on the right. States he took the 4-layer compression off his left leg this morning to shower. He is seeing vein and vascular tomorrow. He states he is using his compression pumps once or twice a day. He also admits that he has been on his feet a lot 08/09/18; the patient has the left leg healed except for the original wound site on the left medial Just above the ankle. On the right he has a new wound on the right lateral calf. He saw vein and vascular last week and he is been back in his pressure stockings and wraparound stockings. He also is been put on metolazone 3 times a week by nephrology along with his torsemide. I'm hopeful that this will help with some of the lower extremity edema. I've always felt that he has lymphedema but there may be a secondary component contributing 08/16/18; the patient used his own junk slight stockings to both legs last week. He claims he is pumping once to twice a day at home. Here rise with his legs looking visibly less edematous. His usual left greater than right secondary to lymphedema. He is tolerating the metolazone 3 times a week along with the torsemide. His weight was down 2 pounds. He  follows up with nephrology soon for follow-up lab work I think in early January. He does not have any open areas on the right leg he still has the crevice on the lateral left calf just above the ankle there is 2 small open areas here I am not sure that this is ever going to be free of an open area although certainly not worsening. More problematic lead he has 2 blistered areas just above the wrist on the left lateral calf 09/06/2018; the patient arrives with much larger legs than I remember seeing a month ago especially on the right. He has 1 open area on the left lateral calf and he had a 4 layer compression on this on arrival in the clinic. This was apparently put on by [facility] in response to a new open wound. He claims he is pumping once or more often twice a day although I really have a hard time believing it. He came in with nothing but stockings on his legs. There is tremendous increase in the edema bilaterally 1/22; the patient only has the one area that is not totally closed and that is the divot on the left lateral calf. I do not think we are going to be able to do anything further to this unless he loses edema fluid in his left leg when he starts dialysis which I gather is sometime soon. He is using his own wraparound compression garment. He is using the compression pumps twice a day. 2/19; the patient does not have any open wounds on the right leg but both legs left and the right have extensive  edema is usually worse on the left. He has a tense blister on the left anterior leg. Using his compression pumps usually once a day per his description. He does not come in with the compression stockings 2/26. Right leg remains without any open wounds that he has his own stocking. The tense blister on the left anterior leg is closed over. He still has the open area on the left lateral mid calf and then the divot injury area which I do not think is going to heal just above the left ankle. 3/11; the  patient arrives with all the wounds on both legs healed. He has his stocking with the wraparound juxta lite on the right. He has 1 for the left. He initiated dialysis on Monday and we will see what effect that has on his lower extremity edema. He has his compression pumps and he plans to use them at least once a day Readmission 01/20/2022 Mr. Costlow Stoops is a 52 year old male with a past medical history of type 1 diabetes on hemodialysis that presents to the clinic for a 1 year history of wound to the right side of his neck. He states that he had a central line in place when he started hemodialysis while his graft matured. He states that the area where the line was removed never healed. He reports serosanguineous drainage and odor to the area over the past year. He has not taken antibiotics for this issue. He currently denies systemic signs of infection. He currently keeps the area covered with a Band-Aid. 5/31; patient presents for follow-up. He has been taking doxycycline without issues. He had a culture done at last clinic visit that showed abundant Streptococcus anginosus sensitive to penicillin. I will go ahead and switch him to Augmentin. He has been using mupirocin ointment to the wound bed and covering this with Hydrofera Blue. Upon inspection today there is a piece of dressing tightly adhered in the wound bed that appears to be connected to underlying structures. He obtains his neck ultrasound tomorrow. He denies systemic signs of infection. 6/14; Patient presents for follow-up. Patient had an ultrasound to his neck that showed a prior PermCath still present is a retained foreign body. This was causing his wound not to heal. On 6/7 the foreign body was removed by Dr.Schnier. Since then the area has healed up nicely. He has no issues or complaints today. He denies any drainage, open wounds or discomfort to the previous wound site. 02/16/2023 Mr. Leanthony Busic is a 51 year old male with a past  medical history of end-stage renal disease on hemodialysis, Type 2 diabetes, And lymphedema/venous insufficiency That presents to the clinic for evaluation of his right lower extremity. He has wounds to his left lower extremity for several months but these are being managed by vein and vascular with a wound VAC. On 02/04/2023 patient was evaluated for new blisters to the right lower extremity and potential cellulitis at vein and vascular and was recommended to go to the ED. He was started on oral antibiotics by the ED and referred to our clinic for follow-up to assure that the right leg wounds improved. He had an Unna boot placed by vein and vascular two days prior. He has home health that changes the unna boot and wound vac 2 times weekly. He currently denies signs of infection. There are no open wounds to the right lower extremity. 7/10; Patient was seen last month for left lower extremity wounds however this was in the care of vein  and vascular and I recommended he discuss with his doctor if he would like to transfer care to the wound care center. At this time patient states that he would like to follow here. He has been using a wound VAC to the left lower extremity large wound and he has been keeping the area covered to the dorsal left foot wound. He would like to stop the wound VAC as He has had trouble with the seal. He currently denies systemic signs of infection. 7/17; patient presents for follow-up. We have been using Dakin's wet-to-dry dressings under Kerlix/Coban to the left lower extremity large wound and Silver alginate to the left dorsal foot wound. Wounds are smaller. Patient has no issues or complaints. 7/24; patient presents for follow-up. We have been using Dakin's wet-to-dry dressings to the larger wound and silver alginate to the smaller dorsal foot wound to the left lower extremity all under Kerlix/Coban. Wounds are smaller today. Patient has no issues or complaints. Originally  patient had a wound VAC. He still has this in his possession but we are not using it. I recommended He returned this. He would also like new lymphedema pumps. 7/31; patient presents for follow-up. We have been using Dakin's wet-to-dry dressings under Kerlix/Coban to the left lower extremity larger wound and silver alginate to the smaller dorsal left foot wound. He was noted to have a fever during dialysis and was found to be bacteremic. He has been in the hospital for the past few days. He is receiving IV cefazolin during dialysis now. He has no issues or complaints about the wounds. The left dorsal foot wound is healed today. He currently denies systemic signs of infection. 8/7; patient presents for follow-up. We have been using silver alginate under Kerlix/Coban to the left lower extremity. Wound is smaller. 8/14; this patient has a reasonably large wound on the left leg. Dimensions are marginally impaired. He has chronic venous insufficiency and his edema is poorly controlled. We have been using kerlix Coban compression. Patient also has external compression pumps at home but he has not been using them. He also has dialysis Tuesday Thursdays and Saturdays 8/21; patient presents for follow-up. We have been using silver cell under compression therapy. The compression wrap was increased to 3 layer at last clinic visit. He states he tolerated this well. He has no issues or complaints today. Wound is smaller. 8/28; patient presents for follow-up. We have been using silver alginate under compression therapy to the left lower extremity. Wound appears well-healing. Unfortunately has developed another wound just lateral to this that looks like it started out as a blister. ZACHERIA, TOOKE A ([id number]) [id number] Page 10 of 12 9/4; patient presents for follow-up. We have been using silver alginate under compression therapy to the left lower extremity. Wound is smaller. New wound  at last clinic visit on the lateral aspect has closed. 9/11; patient presents for follow-up. We have been using silver alginate under compression therapy to the left lower extremity. Wound continues to become smaller. 9/18; patient with longstanding chronic lymphedema. Wound on the left medial lower leg. We have been using silver alginate under Urgo K2 lite. The wound is improved in terms of appearance and measurements per our intake nurse 9/25; chronic longstanding lymphedema. Wound on the left medial lower leg. Measurements are better this week. Last week I think I changed him to [facility] still under and Urgo K2 lite 10/2; chronic longstanding lymphedema with a weak wound on the left anterior medial lower leg.  Not much change in dimension this week slightly hyper granulated. I thought I had changed him to [facility] however according to our intake nurse he has been receiving silver alginate. We are using an Urgo K2 light compression because TBI's on the left were only 0.33 is ABIs were noncompressible. He follows with vein and vascular for his arterial insufficiency 10/9; wound is measuring smaller on the left anterior lower leg. We have been using Hydrofera Blue under Urgo K2 compression. He tells me that he is getting/has received new compression pumps I have asked him to use it 1 hour twice a day even over our compression wraps. 10/16; we have been using Hydrofera Blue under Urgo K2 compression. Wound is measuring smaller and looks reasonably healthy. Unfortunately he did not get a chance to consistently use the compression pumps twice a day. 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 Furthermore he works nights 10/23; Hydrofera Blue under Urgo K2 compression for a wound on his left medial lower leg. The wound measures smaller today. Noticeable improvement in the edema he tells me he is using his external compression pumps once a day at least sometimes  twice. He has a difficult work schedule and works nights 10/30; we are using Hydrofera Blue under Urgo K2 compression. Wound continues to gradually get smaller. He is using his compression pumps at home once a day 07-06-2023 upon evaluation today patient appears to be doing well currently in regard to his wound. This is actually showing signs of excellent epithelization and granulation at this point. Fortunately I do not see any evidence of worsening overall and I do believe that the patient is making good headway here towards closure. 07-11-2023 upon evaluation today patient appears to be doing well currently in regard to his wound. He does seem to be somewhat drying the skin around his as well I think that we may want to switch to Tubigrip since he does well with this and subsequently switch to collagen as well which should allow this to be able to heal faster with the benefit of the collagen. 07-20-2023 upon evaluation today patient's wound unfortunately is showing signs of being a bit worse. He notes that he is having some issues here with areas that he scratch that are now open. This is definitely not what we are looking for. I discussed with him that he needs to make sure that he is not scratching these areas. The region is scar tissue and this means that he is gena have a lot of issues if he scratches it with skin breakdown which is not what we are looking for at all. The patient voiced understanding. 07-27-2023 upon evaluation patient appears to be doing excellent in regard to his leg ulcer I feel like he is getting very close to complete resolution. Fortunately I do not see any evidence of worsening overall and I believe that the patient is making really good headway here towards closure which is great news. 08-10-2023 upon evaluation today patient appears to be doing well currently in regard to his leg ulcer. Has been tolerating the dressing changes without complication and the good news is  he actually is doing quite well in fact he appears to be pretty much completely healed. I do not see any signs of infection at this time. Objective Constitutional Obese and well-hydrated in no acute distress. Vitals Time Taken: 10:36 AM, Height: 76 in, Weight: 400 lbs, BMI: 48.7, Temperature: 98.4  F, Pulse: 58 bpm, Respiratory Rate: 18 breaths/min, Blood Pressure: 150/67 mmHg. Respiratory normal breathing without difficulty. Psychiatric this patient is able to make decisions and demonstrates good insight into disease process. Alert and Oriented x 3. pleasant and cooperative. General Notes: Upon inspection I did not see any signs of opening at this point I feel like that he is actually making good headway towards closure there does appear to be a brand-new area right in the middle of the wound that has not completely sealed 100% but he is extremely close. In fact it might even be brand- new skin is difficult to tell this is just a 0.1 area. Integumentary (Hair, Skin) Wound #22 status is Open. Original cause of wound was Gradually Appeared. The date acquired was: 08/30/2022. The wound has been in treatment 25 weeks. The wound is located on the Left Lower Leg. The wound measures 0.1cm length x 0.1cm width x 0.1cm depth; 0.008cm^2 area and 0.001cm^3 volume. There is Fat Layer (Subcutaneous Tissue) exposed. There is no tunneling or undermining noted. There is a none present amount of drainage noted. There is large (67- 100%) red granulation within the wound bed. There is no necrotic tissue within the wound bed. Assessment Active Problems Boule, Joden A ([id number]) [id number] Page 11 of 12 ICD-10 Non-pressure chronic ulcer of other part of left lower leg with fat layer exposed Chronic venous hypertension (idiopathic) with ulcer of left lower extremity Lymphedema, not elsewhere classified Chronic venous hypertension (idiopathic) with inflammation of right lower  extremity Type 2 diabetes mellitus with other skin ulcer Plan 1. I would recommend that we have the patient continue to monitor for any signs of infection or worsening and based on what I am seeing I do believe that we are making good headway here towards complete closure I think it probably be done by next week. 2. I am going to recommend that we have the patient continue to elevate his legs much as possible and use his lymphedema pumps regularly. 3. I will suggest that he should continue to elevate his legs when he is not at work. We will see patient back for reevaluation in 1 week here in the clinic. If anything worsens or changes patient will contact our office for additional recommendations. Electronic Signature(s) Signed: 08/10/2023 10:57:44 AM By: Allen Derry PA-C Entered By: Allen Derry on 08/10/2023 07:57:44 -------------------------------------------------------------------------------- SuperBill Details Patient Name: Date of Service: [name], [name] 08/10/2023 Medical Record Number: [id number] Patient Account Number: [id number] Date of Birth/Sex: Treating RN: 10-03-1970 (52 y.o. [name]) [name] Primary Care Provider: [name] Other Clinician: Referring Provider: Treating Provider/Extender: [name] in Treatment: 25 Diagnosis Coding ICD-10 Codes Code Description [phone] Non-pressure chronic ulcer of other part of left lower leg with fat layer exposed I87.312 Chronic venous hypertension (idiopathic) with ulcer of left lower extremity I89.0 Lymphedema, not elsewhere classified I87.321 Chronic venous hypertension (idiopathic) with inflammation of right lower extremity E11.622 Type 2 diabetes mellitus with other skin ulcer Facility Procedures : CPT4 Code: [id number] Description: 83151 - WOUND CARE VISIT-LEV 2 EST PT Modifier: Quantity: 1 Physician Procedures : CPT4 Code Description Modifier [id number] - WC PHYS LEVEL 3 - EST PT ICD-10  Diagnosis Description L97.822 Non-pressure chronic ulcer of other part of left lower leg with fat layer exposed I87.312 Chronic venous hypertension (idiopathic) with ulcer  of left lower extremity I89.0 Lymphedema, not elsewhere classified I87.321 Chronic venous hypertension (idiopathic) with inflammation of right lower extremity Benegas, Luisangel A ([id number])  [id number] Page 12 of Quantity: 1 12 Electronic Signature(s) Signed: 08/10/2023 3:53:53 PM By: [name] RN Signed: 08/10/2023 7:54:12 PM By: Allen Derry PA-C Previous Signature: 08/10/2023 10:58:00 AM Version By: Allen Derry PA-C Entered By: [name] on 08/10/2023 12:53:53

## 2023-08-11 DIAGNOSIS — Z992 Dependence on renal dialysis: Secondary | ICD-10-CM | POA: Diagnosis not present

## 2023-08-11 DIAGNOSIS — N186 End stage renal disease: Secondary | ICD-10-CM | POA: Diagnosis not present

## 2023-08-11 NOTE — Progress Notes (Signed)
[name], [name] ([id number]) [id number] Page 1 of 8 Visit Report for 08/10/2023 Arrival Information Details Patient Name: Date of Service: [name] 08/10/2023 10:15 [name] Medical Record Number: [id number] Patient Account Number: [id number] Date of Birth/Sex: Treating RN: Jan 10, 1971 (52 y.o. [name]) [name] Primary Care [name]: [name] Other Clinician: Referring [name]: Treating [name]/Extender: [name] in Treatment: 25 Visit Information History Since Last Visit Added or deleted any medications: No Patient Arrived: Ambulatory Any new allergies or adverse reactions: No Arrival Time: 10:32 Had [name] fall or experienced change in No Accompanied By: self activities of daily living that may affect Transfer Assistance: None risk of falls: Patient Identification Verified: Yes Signs or symptoms of abuse/neglect since last visito No Secondary Verification Process Completed: Yes Hospitalized since last visit: No Patient Requires Transmission-Based Precautions: No Implantable device outside of the clinic excluding No Patient Has Alerts: Yes cellular tissue based products placed in the center Patient Alerts: Patient on Blood Thinner since last visit: ABI R [place] TBI .61 10/11/22 Has Dressing in Place as Prescribed: Yes ABI L [place] TBI .33 10/11/22 Has Compression in Place as Prescribed: Yes Pain Present Now: No Electronic Signature(s) Signed: 08/11/2023 2:31:23 PM By: [name] Pax RN Entered By: [name] on 08/10/2023 10:36:24 -------------------------------------------------------------------------------- Clinic Level of Care Assessment Details Patient Name: Date of Service: [name] LT, [name] 08/10/2023 10:15 [name] Medical Record Number: [id number] Patient Account Number: [id number] Date of Birth/Sex: Treating RN: 12/02/1970 (52 y.o. [name] Primary Care [name]: [name] Other Clinician: Referring  Adalberto Metzgar: Treating [name]/Extender: [name] in Treatment: 25 Clinic Level of Care Assessment Items TOOL 4 Quantity Score X- 1 0 Use when only an EandM is performed on FOLLOW-UP visit ASSESSMENTS - Nursing Assessment / Reassessment X- 1 10 Reassessment of Co-morbidities (includes updates in patient status) X- 1 5 Reassessment of Adherence to Treatment Plan [name] ([id number]) [id number] Page 2 of 8 ASSESSMENTS - Wound and Skin [name] ssessment / Reassessment X - Simple Wound Assessment / Reassessment - one wound 1 5 []  - 0 Complex Wound Assessment / Reassessment - multiple wounds []  - 0 Dermatologic / Skin Assessment (not related to wound area) ASSESSMENTS - Focused Assessment []  - 0 Circumferential Edema Measurements - multi extremities []  - 0 Nutritional Assessment / Counseling / Intervention []  - 0 Lower Extremity Assessment (monofilament, tuning fork, pulses) []  - 0 Peripheral Arterial Disease Assessment (using hand held doppler) ASSESSMENTS - Ostomy and/or Continence Assessment and Care []  - 0 Incontinence Assessment and Management []  - 0 Ostomy Care Assessment and Management (repouching, etc.) PROCESS - Coordination of Care X - Simple Patient / Family Education for ongoing care 1 15 []  - 0 Complex (extensive) Patient / Family Education for ongoing care []  - 0 Staff obtains Chiropractor, Records, T Results / Process Orders est []  - 0 Staff telephones HHA, Nursing Homes / Clarify orders / etc []  - 0 Routine Transfer to another Facility (non-emergent condition) []  - 0 Routine Hospital Admission (non-emergent condition) []  - 0 New Admissions / Manufacturing engineer / Ordering NPWT Apligraf, etc. , []  - 0 Emergency Hospital Admission (emergent condition) X- 1 10 Simple Discharge Coordination []  - 0 Complex (extensive) Discharge Coordination PROCESS - Special Needs []  - 0 Pediatric / Minor Patient  Management []  - 0 Isolation Patient Management []  - 0 Hearing / Language / Visual special needs []  - 0 Assessment of Community assistance (transportation, D/C planning,  etc.) []  - 0 Additional assistance / Altered mentation []  - 0 Support Surface(s) Assessment (bed, cushion, seat, etc.) INTERVENTIONS - Wound Cleansing / Measurement X - Simple Wound Cleansing - one wound 1 5 []  - 0 Complex Wound Cleansing - multiple wounds []  - 0 Wound Imaging (photographs - any number of wounds) []  - 0 Wound Tracing (instead of photographs) X- 1 5 Simple Wound Measurement - one wound []  - 0 Complex Wound Measurement - multiple wounds INTERVENTIONS - Wound Dressings X - Small Wound Dressing one or multiple wounds 1 10 []  - 0 Medium Wound Dressing one or multiple wounds []  - 0 Large Wound Dressing one or multiple wounds []  - 0 Application of Medications - topical []  - 0 Application of Medications - injection INTERVENTIONS - Miscellaneous []  - 0 External ear exam [name], [name] ([id number]) [id number] Page 3 of 8 []  - 0 Specimen Collection (cultures, biopsies, blood, body fluids, etc.) []  - 0 Specimen(s) / Culture(s) sent or taken to Lab for analysis []  - 0 Patient Transfer (multiple staff / Michiel Sites Lift / Similar devices) []  - 0 Simple Staple / Suture removal (25 or less) []  - 0 Complex Staple / Suture removal (26 or more) []  - 0 Hypo / Hyperglycemic Management (close monitor of Blood Glucose) []  - 0 Ankle / Brachial Index (ABI) - do not check if billed separately X- 1 5 Vital Signs Has the patient been seen at the hospital within the last three years: Yes Total Score: 70 Level Of Care: New/Established - Level 2 Electronic Signature(s) Signed: 08/11/2023 2:31:23 PM By: [name] Pax RN Entered By: [name] on 08/10/2023 15:53:39 -------------------------------------------------------------------------------- Encounter Discharge Information  Details Patient Name: Date of Service: [name] LT, [name] 08/10/2023 10:15 [name] Medical Record Number: [id number] Patient Account Number: [id number] Date of Birth/Sex: Treating RN: Nov 09, 1970 (52 y.o. [name] Primary Care Anne-Marie Genson: [name] Other Clinician: Referring Rodell Marrs: Treating Kanetra [name]/Extender: [name] in Treatment: 25 Encounter Discharge Information Items Discharge Condition: Stable Ambulatory Status: Ambulatory Discharge Destination: Home Transportation: Private Auto Accompanied By: self Schedule Follow-up Appointment: Yes Clinical Summary of Care: Electronic Signature(s) Signed: 08/10/2023 3:55:20 PM By: [name] Pax RN Entered By: [name] on 08/10/2023 15:55:19 Lower Extremity Assessment Details -------------------------------------------------------------------------------- [name] [id number] Page 4 of 8 Patient Name: Date of Service: [name] LT, [name] 08/10/2023 10:15 [name] Medical Record Number: [id number] Patient Account Number: [id number] Date of Birth/Sex: Treating RN: April 22, 1971 (52 y.o. [name]) [name] Primary Care Seneca Gadbois: [name] Other Clinician: Referring Jannel Lynne: Treating Quanita Barona/Extender: [name] in Treatment: 25 Edema Assessment Left: Right: Assessed: No No Edema: No Vascular Assessment Left: Right: Pulses: Dorsalis Pedis Palpable: Yes Extremity colors, hair growth, and conditions: Extremity Color: Hyperpigmented Hair Growth on Extremity: No Temperature of Extremity: Warm Capillary Refill: < 3 seconds Dependent Rubor: No Blanched when Elevated: No Lipodermatosclerosis: No Toe Nail Assessment Left: Right: Thick: Yes Discolored: Yes Deformed: Yes Improper Length and Hygiene: Yes Electronic Signature(s) Signed: 08/11/2023 2:31:23 PM By: [name] Pax RN Entered By: [name] on 08/10/2023  10:43:39 -------------------------------------------------------------------------------- Multi Wound Chart Details Patient Name: Date of Service: [name] LT, [name] 08/10/2023 10:15 [name] Medical Record Number: [id number] Patient Account Number: [id number] Date of Birth/Sex: Treating RN: 09-Mar-1971 (52 y.o. [name] Primary Care Jeana Kersting: [name] Other Clinician: Referring Iyan Flett: Treating Lonnell Chaput/Extender: [name] in Treatment: 25 Vital Signs Height(in): 76 Pulse(bpm): 58  Weight(lbs): 400 Blood Pressure(mmHg): 150/67 Body Mass Index(BMI): 48.7 Temperature(F): 98.4 Respiratory Rate(breaths/min): 18 [22:Photos: No Photos Left Lower Leg Wound Location: Gradually Appeared Wounding Event: Diabetic Wound/Ulcer of the Lower Primary Etiology: Extremity] [N/[name]:N/[name] N/[name] N/[name] N/[name]] [name], [name] ([id number]) [22:Hypertension, Type II Diabetes, Comorbid History: Neuropathy 08/30/2022 Date Acquired: 25 Weeks of Treatment: Open Wound Status: No Wound Recurrence: 0.1x0.1x0.1 Measurements L x W x D (cm) 0.008 [name] (cm) : rea 0.001 Volume (cm) : 100.00% % Reduction in  [name] rea: 100.00% % Reduction in Volume: Grade 2 Classification: None Present Exudate [name] mount: Large (67-100%) Granulation [name] mount: Red Granulation Quality: None Present (0%) Necrotic [name] mount: Fat Layer (Subcutaneous Tissue): Yes N/[name] Exposed Structures:  Fascia: No Tendon: No Muscle: No Joint: No Bone: No None Epithelialization:] [N/[name]:N/[name] N/[name] N/[name] N/[name] N/[name] N/[name] N/[name] N/[name] N/[name] N/[name] N/[name] N/[name] N/[name] N/[name] N/[name] N/[name]] Treatment Notes Electronic Signature(s) Signed: 08/10/2023 11:08:46 AM By: [name] Pax RN Entered By: [name] on 08/10/2023 11:08:46 -------------------------------------------------------------------------------- Multi-Disciplinary Care Plan Details Patient Name: Date of Service: [name] LT, [name] 08/10/2023 10:15 [name] Medical Record Number: [id number] Patient Account Number: [id number] Date of  Birth/Sex: Treating RN: 07-Apr-1971 (52 y.o. [name] Primary Care Turki Tapanes: [name] Other Clinician: Referring Shyheim Tanney: Treating Foster Sonnier/Extender: [name] in Treatment: 25 Active Inactive Electronic Signature(s) Signed: 08/10/2023 3:54:00 PM By: [name] Pax RN Entered By: [name] on 08/10/2023 15:54:00 Pain Assessment Details -------------------------------------------------------------------------------- [name] ([id number]) [id number] Page 6 of 8 Patient Name: Date of Service: [name] LT, [name] 08/10/2023 10:15 [name] Medical Record Number: [id number] Patient Account Number: [id number] Date of Birth/Sex: Treating RN: 01/04/1971 (52 y.o. [name]) [name] Primary Care Roshana Shuffield: [name] Other Clinician: Referring Syncere Kaminski: Treating Salvador Bigbee/Extender: [name] in Treatment: 25 Active Problems Location of Pain Severity and Description of Pain Patient Has Paino No Site Locations Pain Management and Medication Current Pain Management: Electronic Signature(s) Signed: 08/11/2023 2:31:23 PM By: [name] Pax RN Entered By: [name] on 08/10/2023 10:37:01 -------------------------------------------------------------------------------- Patient/Caregiver Education Details Patient Name: Date of Service: [name] LT, [name] 12/11/2024andnbsp10:15 [name] Medical Record Number: [id number] Patient Account Number: [id number] Date of Birth/Gender: Treating RN: 1971/01/05 (52 y.o. [name] Primary Care Physician: [name] Other Clinician: Referring Physician: Treating Physician/Extender: [name] in Treatment: 25 Education Assessment Education Provided To: Patient Education Topics Provided Wound/Skin Impairment: Handouts: Caring for Your Ulcer Methods: Explain/Verbal Responses: State content correctly [name], [name] ([id number])  [id number] Page 7 of 8 Electronic Signature(s) Signed: 08/11/2023 2:31:23 PM By: [name] Pax RN Entered By: [name] on 08/10/2023 15:54:11 -------------------------------------------------------------------------------- Wound Assessment Details Patient Name: Date of Service: [name] LT, [name] 08/10/2023 10:15 [name] Medical Record Number: [id number] Patient Account Number: [id number] Date of Birth/Sex: Treating RN: Aug 14, 1971 (52 y.o. [name]) [name] Primary Care Mckenzye Cutright: [name] Other Clinician: Referring Jadavion Spoelstra: Treating Damen Windsor/Extender: [name] in Treatment: 25 Wound Status Wound Number: 22 Primary Etiology: Diabetic Wound/Ulcer of the Lower Extremity Wound Location: Left Lower Leg Wound Status: Open Wounding Event: Gradually Appeared Comorbid History: Hypertension, Type II Diabetes, Neuropathy Date Acquired: 08/30/2022 Weeks Of Treatment: 25 Clustered Wound: No Wound Measurements Length: (cm) 0.1 Width: (cm) 0.1 Depth: (cm) 0.1 Area: (cm) 0.008 Volume: (cm) 0.001 % Reduction in Area: 100% % Reduction in Volume: 100% Epithelialization: None Tunneling: No Undermining: No Wound Description Classification: Grade 2 Exudate Amount: None Present Foul  Odor After Cleansing: No Slough/Fibrino No Wound Bed Granulation Amount: Large (67-100%) Exposed Structure Granulation Quality: Red Fascia Exposed: No Necrotic Amount: None Present (0%) Fat Layer (Subcutaneous Tissue) Exposed: Yes Tendon Exposed: No Muscle Exposed: No Joint Exposed: No Bone Exposed: No Treatment Notes Wound #22 (Lower Leg) Wound Laterality: Left Cleanser Soap and Water Discharge Instruction: Gently cleanse wound with antibacterial soap, rinse and pat dry prior to dressing wounds Wound Cleanser Discharge Instruction: Wash your hands with soap and water. Remove old dressing, discard into plastic bag and place into trash. Cleanse  the wound with Wound Cleanser prior to applying [name] clean dressing using gauze sponges, not tissues or cotton balls. Do not scrub or use excessive force. Pat dry using gauze sponges, not tissue or cotton balls. Peri-Wound Care Topical Primary Dressing Xeroform-HBD 2x2 (in/in) Discharge Instruction: Apply Xeroform-HBD 2x2 (in/in) as directed [name], [name] ([id number]) [id number] Page 8 of 8 Secondary Dressing ABD Pad 5x9 (in/in) Discharge Instruction: Cover with ABD pad Secured With Tubigrip Size D, 3x10 (in/yd) Compression Wrap Compression Stockings Add-Ons Electronic Signature(s) Signed: 08/11/2023 2:31:23 PM By: [name] Pax RN Entered By: [name] on 08/10/2023 10:52:25 -------------------------------------------------------------------------------- Vitals Details Patient Name: Date of Service: [name] LT, [name] 08/10/2023 10:15 [name] Medical Record Number: [id number] Patient Account Number: [id number] Date of Birth/Sex: Treating RN: 1971/04/07 (52 y.o. [name] Primary Care Delayla Hoffmaster: [name] Other Clinician: Referring Dereke Neumann: Treating Lametria Klunk/Extender: [name] in Treatment: 25 Vital Signs Time Taken: 10:36 Temperature (F): 98.4 Height (in): 76 Pulse (bpm): 58 Weight (lbs): 400 Respiratory Rate (breaths/min): 18 Body Mass Index (BMI): 48.7 Blood Pressure (mmHg): 150/67 Reference Range: 80 - 120 mg / dl Electronic Signature(s) Signed: 08/11/2023 2:31:23 PM By: [name] Pax RN Entered By: [name] on 08/10/2023 10:36:48

## 2023-08-12 ENCOUNTER — Telehealth: Payer: Self-pay | Admitting: *Deleted

## 2023-08-12 NOTE — Progress Notes (Signed)
  Care Coordination Note  08/12/2023 Name: [name] MRN: [id number] DOB: 1971/03/03  [name] is a 52 y.o. year old male who is a primary care patient of [name], [name], MD and is actively engaged with the care management team. I reached out to [employer] by phone today to assist with re-scheduling a follow up visit with the RN Case Manager  Follow up plan: Unsuccessful telephone outreach attempt made. A HIPAA compliant phone message was left for the patient providing contact information and requesting a return call.   Burman Nieves, CCMA Care Coordination Care Guide Direct Dial: [phone]

## 2023-08-12 NOTE — Progress Notes (Signed)
  Care Coordination Note  08/12/2023 Name: [name] MRN: [id number] DOB: 01-23-1971  [name] is a 53 y.o. year old male who is a primary care patient of [name], [name], MD and is actively engaged with the care management team. I reached out to [name] by phone today to assist with re-scheduling a follow up visit with the RN Case Manager  Follow up plan: Telephone appointment with care management team member scheduled for:09/02/2023  [name], [facility] Care Coordination Care Guide Direct Dial: [phone]

## 2023-08-13 DIAGNOSIS — Z992 Dependence on renal dialysis: Secondary | ICD-10-CM | POA: Diagnosis not present

## 2023-08-13 DIAGNOSIS — N186 End stage renal disease: Secondary | ICD-10-CM | POA: Diagnosis not present

## 2023-08-16 DIAGNOSIS — N186 End stage renal disease: Secondary | ICD-10-CM | POA: Diagnosis not present

## 2023-08-16 DIAGNOSIS — Z992 Dependence on renal dialysis: Secondary | ICD-10-CM | POA: Diagnosis not present

## 2023-08-16 IMAGING — US US EXTREM UP *R* LTD
1 series · 15 of 24 positions shown · non-contrast
Comparison: None Available.

CLINICAL DATA: Open wound of part of clavicle for 1 year. Initial
encounter.

EXAM:
ULTRASOUND RIGHT UPPER EXTREMITY LIMITED
TECHNIQUE: Ultrasound examination of the upper extremity soft tissues was
performed in the area of clinical concern.

[Series 1: us extrem low bilat ltd · 24 acquisitions, 15 frames shown]
[im 1/24]
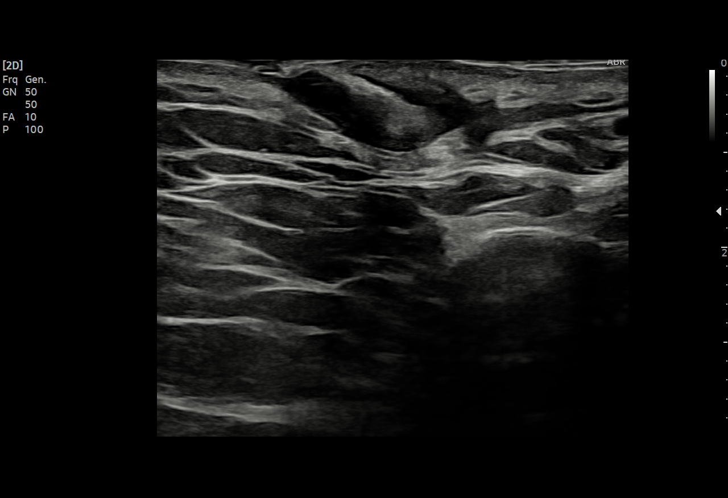
[im 3/24]
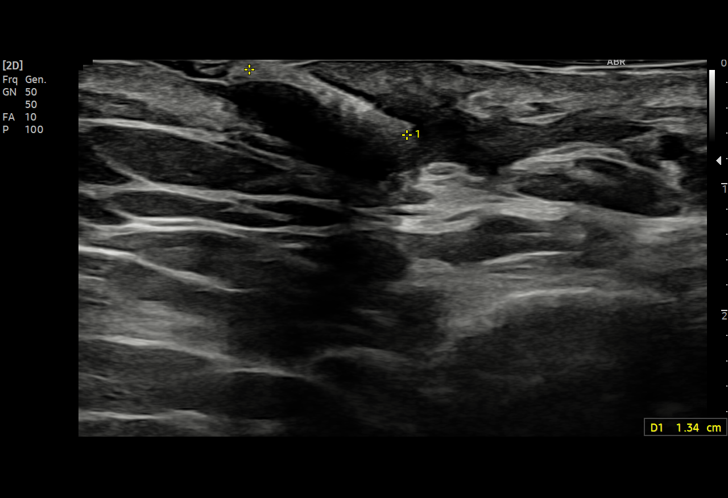
[im 5/24]
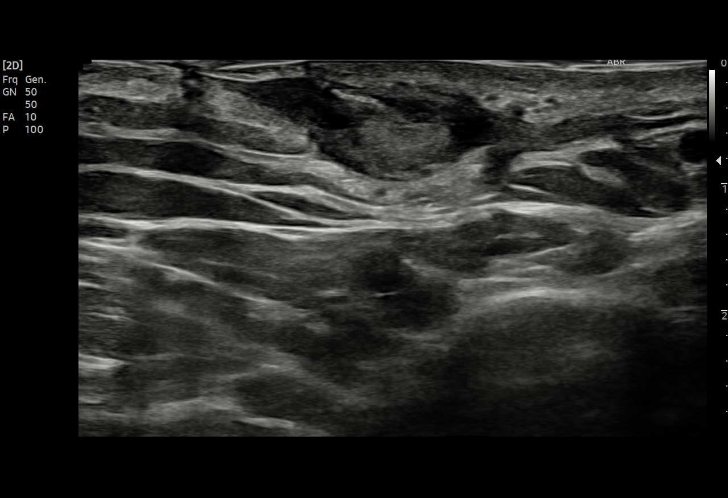
[im 6/24]
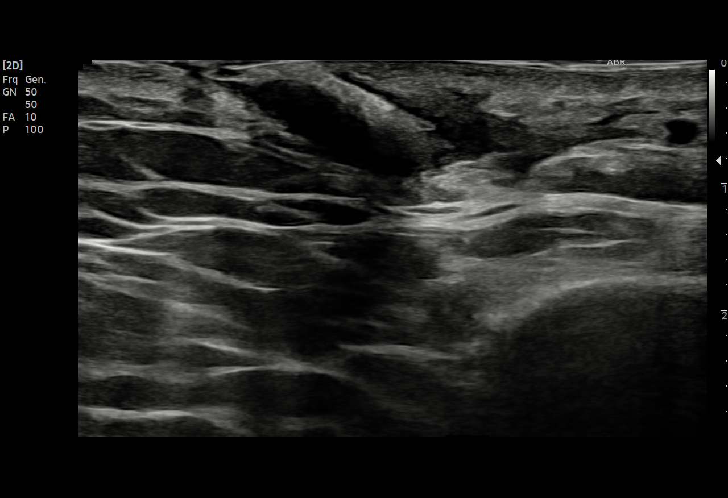
[im 8/24]
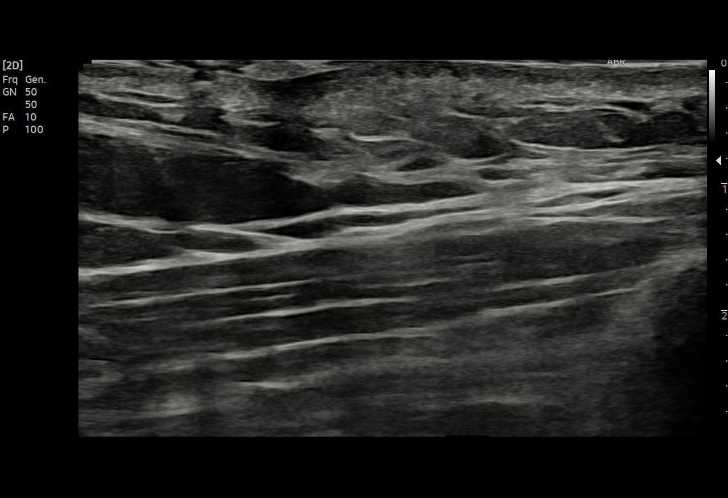
[im 9/24]
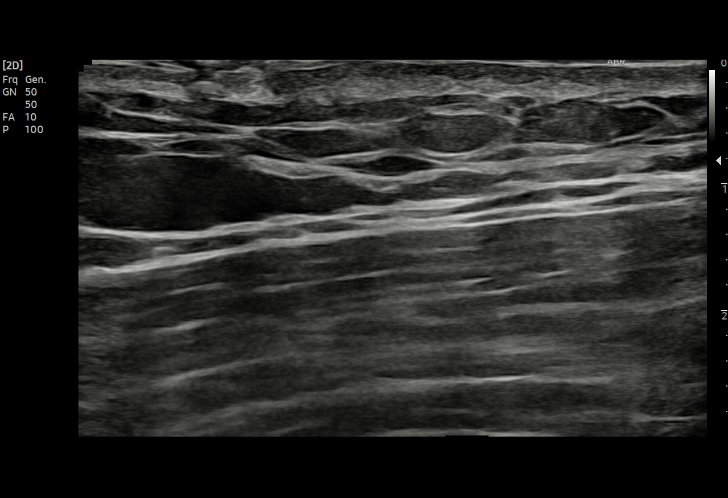
[im 11/24]
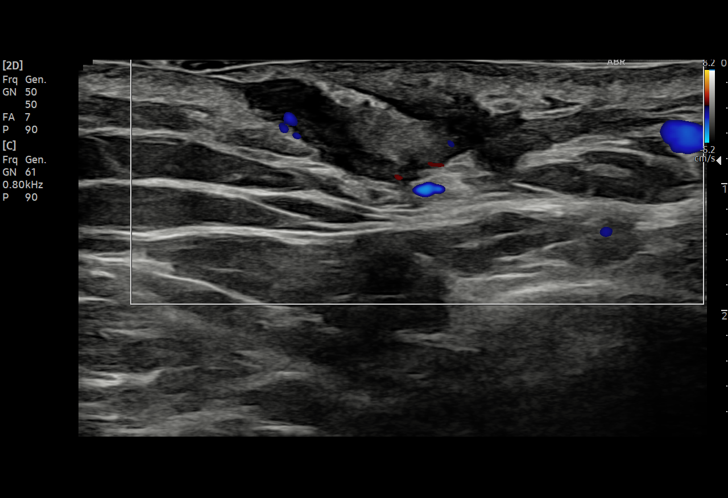
[im 13/24]
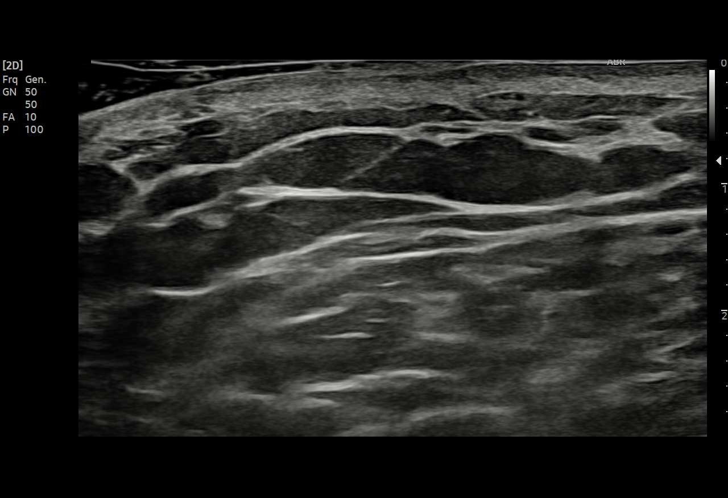
[im 14/24]
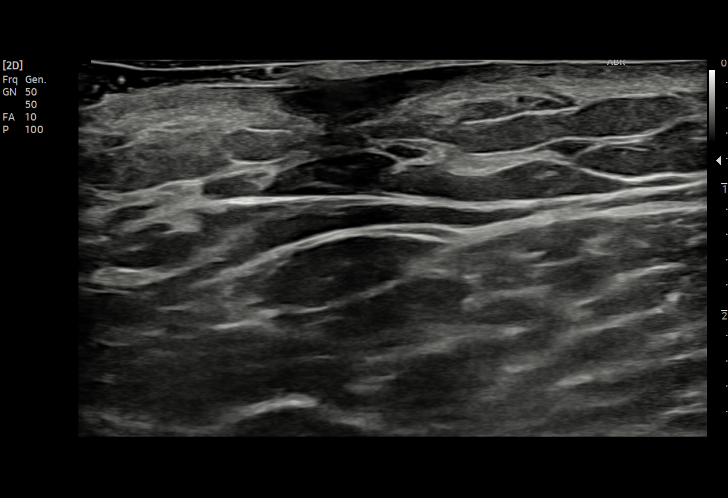
[im 16/24]
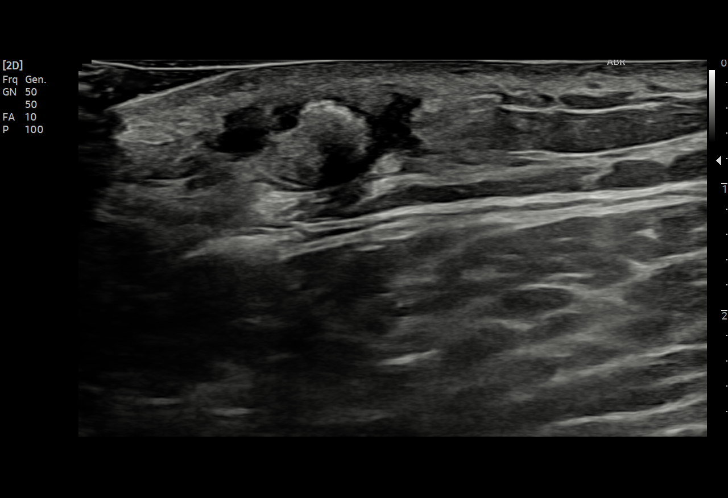
[im 17/24]
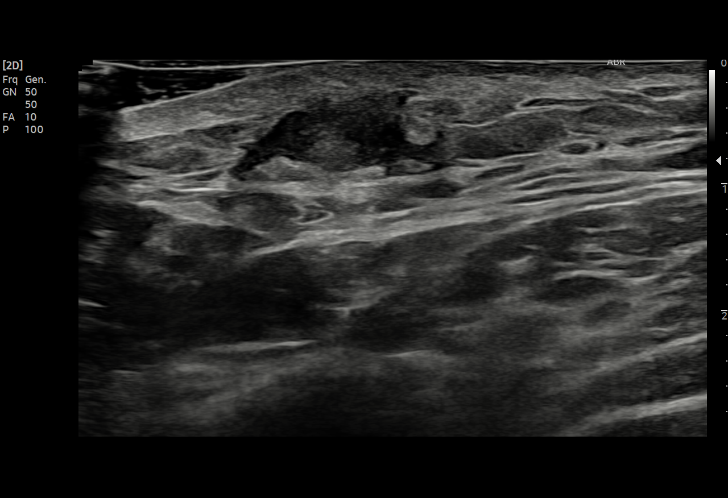
[im 19/24]
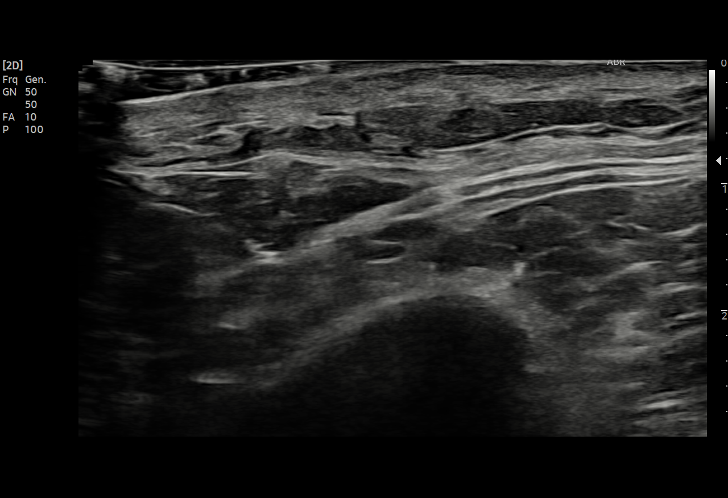
[im 21/24]
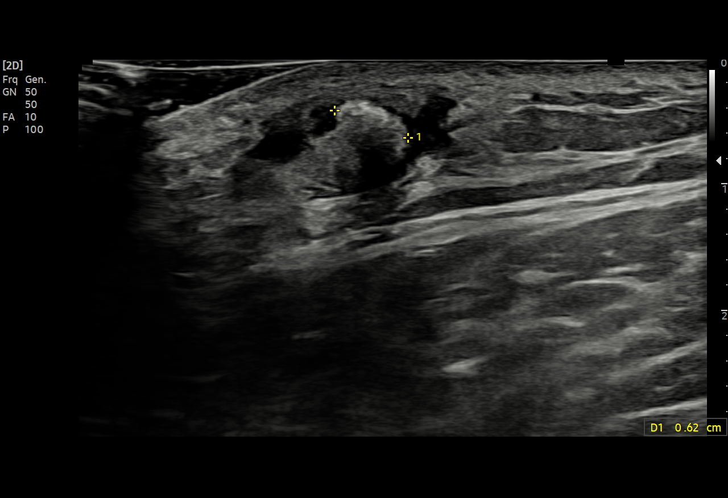
[im 22/24]
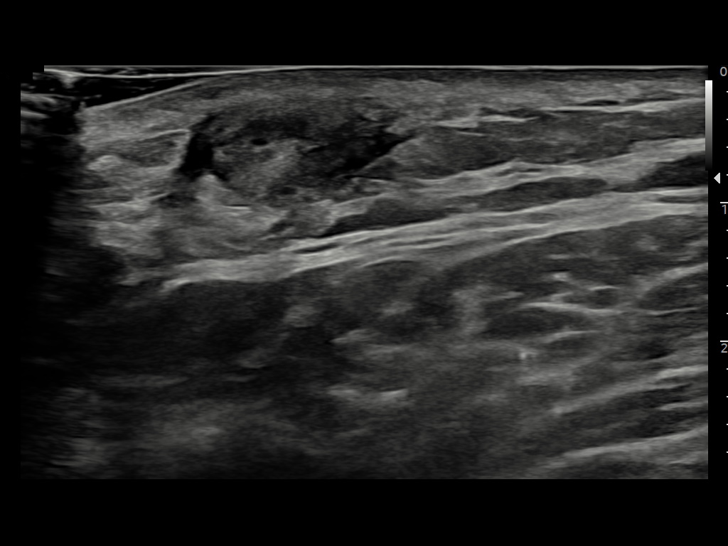
[im 24/24]
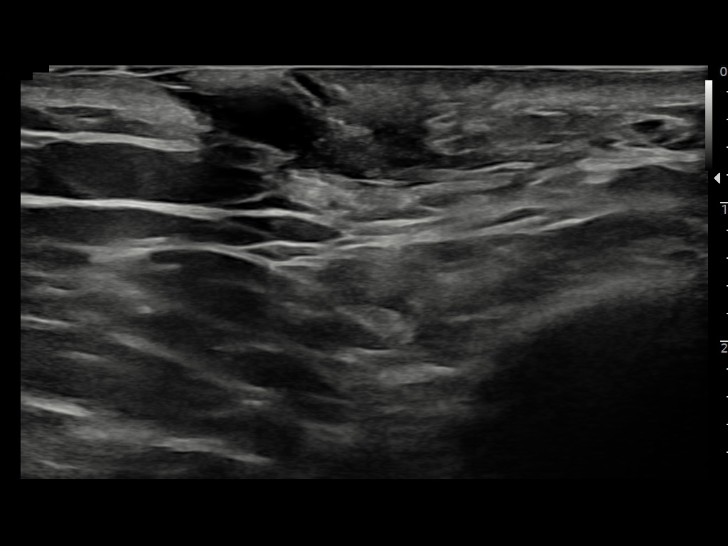

[15 of 24 positions shown; findings below may reference images not displayed]

FINDINGS: In the area of interest of the anterior right clavicular region, the
patient had a PermCath removed 1 year ago with no healing at that
site. Clinical concern for possibly of the "cuff" not having been
removed. There is an echogenic structure at the PermCath site in the
area of interest measuring up to approximately 6 mm, surrounded by
hypoechoic complex fluid that overall measures up to 3.1 by 0.9 by
1.6 cm.
IMPRESSION: In the area of interest of a prior PermCath that has been removed 1
year ago, there is complex fluid surrounding a hypoechoic structure
measuring up to 6 mm. Recommend clinical correlation for possible
retained foreign body such as the cuff of the patient's PermCath.

## 2023-08-17 ENCOUNTER — Encounter: Payer: Medicare HMO | Admitting: Physician Assistant

## 2023-08-17 DIAGNOSIS — N184 Chronic kidney disease, stage 4 (severe): Secondary | ICD-10-CM | POA: Diagnosis not present

## 2023-08-17 DIAGNOSIS — I87321 Chronic venous hypertension (idiopathic) with inflammation of right lower extremity: Secondary | ICD-10-CM | POA: Diagnosis not present

## 2023-08-17 DIAGNOSIS — E11622 Type 2 diabetes mellitus with other skin ulcer: Secondary | ICD-10-CM | POA: Diagnosis not present

## 2023-08-17 DIAGNOSIS — L97822 Non-pressure chronic ulcer of other part of left lower leg with fat layer exposed: Secondary | ICD-10-CM | POA: Diagnosis not present

## 2023-08-17 DIAGNOSIS — Z79899 Other long term (current) drug therapy: Secondary | ICD-10-CM | POA: Diagnosis not present

## 2023-08-17 DIAGNOSIS — I890 Lymphedema, not elsewhere classified: Secondary | ICD-10-CM | POA: Diagnosis not present

## 2023-08-17 DIAGNOSIS — I120 Hypertensive chronic kidney disease with stage 5 chronic kidney disease or end stage renal disease: Secondary | ICD-10-CM | POA: Diagnosis not present

## 2023-08-17 DIAGNOSIS — I87312 Chronic venous hypertension (idiopathic) with ulcer of left lower extremity: Secondary | ICD-10-CM | POA: Diagnosis not present

## 2023-08-17 DIAGNOSIS — E1122 Type 2 diabetes mellitus with diabetic chronic kidney disease: Secondary | ICD-10-CM | POA: Diagnosis not present

## 2023-08-17 DIAGNOSIS — E119 Type 2 diabetes mellitus without complications: Secondary | ICD-10-CM | POA: Diagnosis not present

## 2023-08-17 NOTE — Progress Notes (Addendum)
[name], [name] ([id number]) [id number] Page 1 of 11 Visit Report for 08/17/2023 Chief Complaint Document Details Patient Name: Date of Service: [name], [name] 08/17/2023 8:00 [name] Medical Record Number: [id number] Patient Account Number: [id number] Date of Birth/Sex: Treating RN: April 08, 1971 (52 y.o. [name]) [name] Primary Care Provider: [name] Other Clinician: Referring Provider: Treating Provider/Extender: [name] in Treatment: 26 Information Obtained from: Patient Chief Complaint Left LE Ulcer Electronic Signature(s) Signed: 08/17/2023 8:32:29 AM By: Allen Derry PA-C Entered By: Allen Derry on 08/17/2023 05:32:29 -------------------------------------------------------------------------------- HPI Details Patient Name: Date of Service: [name], [name] 08/17/2023 8:00 [name] Medical Record Number: [id number] Patient Account Number: [id number] Date of Birth/Sex: Treating RN: 1971-02-05 (52 y.o. [name] Primary Care Provider: [name] Other Clinician: Referring Provider: Treating Provider/Extender: [name] in Treatment: 26 History of Present Illness HPI Description: 12/28/17; this is [name] now 52 year old man who is [name] type II diabetic. He was hospitalized from 10/01/17 through 10/19/17. He had an MSSA soft tissue and skin infection. 2 open areas on the left leg were identified he has [name] smaller area on the left medial calf superiorly just below the knee and [name] wound just above the left ankle on the posterior medial aspect. I think both of these were surgical IandD sites when he was in the hospital. He was discharged with [name] wound VAC at that point however this is since been taken off. He follows with Dr. Sampson Goon for the [facility] and he is still on chronic Keflex at 500 twice [name] day. At that time he was hospitalized his hemoglobin A1c was 15.1 however if I'm reading his endocrinologist notes  correctly that is improved. He has been following with Dr. Lorretta Harp at vein and vascular and he has been applying calcium alginate and Unna boots. He has home health changing the dressing. They have also been attempting to get him external compression pumps although the patient is unaware whether they've been approved by insurance at this point. as mentioned he has [name] smaller clean wound on the right lateral calf just below the knee and he has [name] much larger area just above the left ankle medially and posteriorly. Our intake nurse reported greenish purulent looking drainage.the patient did have surgical material sent to pathology in February. This showed chronic abscess The patient also has lymphedema stage III in the left greater than right lower extremities. He has [name] history of blisters with wounds but these of all were always healed. The patient thinks that the lymphedema may have been present since he was about 52 years old i.e. about 30 years. He does not have graded pressure stockings and has not worn stockings. He does not have [name] distant history of DVT PE or phlebitis. He has not been systemically unwell fever no chills. He states that his Lasix is recently been reduced. He tells me his kidney function is at "30%" and he has been followed by Dr. Thedore Mins of nephrology. At one MATTI, KEPP [name] ([id number]) [id number] Page 2 of 11 point he was on Lasix 80 twice [name] day however that's been cut back and he is now on Lasix at 20 twice [name] day. The patient has [name] history of PAD listed in his records although he comes from Dr. Lorretta Harp I don't think is felt to have significant PAD. ABIs in our clinic were noncompressible bilaterally. 01/04/18; patient has [name] large wound on the left lateral lower calf and  [name] small wound on the left medial upper calf. He has been to see his nephrologist who changed him to Demadex 40 mg [name] day. I'm hopeful this will help with his systemic fluid overload. He  also has stage III lymphedema. Really no change in the 2 wounds since last week 01/11/18; the patient is down 13 pounds. He put his stage III lymphedema left leg in 4K compression last week and there is less edema fluid however we still haven't been able to communicate with home health but apparently it is [facility] but the dressings have not been changed. The patient noted an odor last week. He is also had compression pumps ordered by [facility] this as [name] not completed the paperwork stage. 01/18/18; patient continues to lose weight. Stage III lymphedema in the left greater than right leg under for alert compression. The major wound is on the left lateral ankle area. He apparently has bilateral compression pumps being brought to his house, these were ordered by [facility] Notable for the fact today he had some blisters on the right anterior leg together with some skin nodules. This is no doubt secondary to severe lymphedema. 01/25/18; the patient has obtained his compression pumps and is using them per vein and vascular instructions 3 times [name] day for an hour. He also saw [name] of vein and vascular. He was felt to have venous insufficiency but did not suggest any intervention also improved edema. It was suggested that he have compression stockings 20-30 mm on [name] daily basis in addition to compression pumps. The patient arrives in clinic today with [name] layer of unna under for layer compression. he seems to have some trouble with the degree of compression. He has open areas on the left lateral ankle area which is his major wound left upper medial calf and [name] superficial open area on the right anterior shin area which was blistered last week. He has skin changes on the right anterior calf which I think are no doubt secondary to lymphedema skin nodules etc. 02/01/18; the patient comes in telling us his nephrologist have to his torsemide. Unfortunately today he is put on 7 pounds by  our scales. He has blisters all over the anterior and medial part of his right calf and [name] new open wound. He also has soupy green drainage coming out of the left lateral calf /ankle wound. 02/08/18; culture I did last week of the left lateral ankle wound grew both Pseudomonas and Morganella. He is on Keflex from Dr. Sampson Goon in the hospital. I will need to review these notes.in any case Keflex is not going to cover these 2 organisms. I'm probably going to added ciprofloxacin today for 1 week. [name] lot of drainage that looks purulent last week. He is not complaining of pain however he has managed to put on 10 pounds in 2 weeks by our scales in this clinic. He is going to see his nephrologist tomorrow 02/15/18; he completed the ciprofloxacin I gave him last week. Notable that he is up to 379 pounds today which is up 16 pounds from 2 weeks ago. He is complaining of orthopnea but doesn't have any chest pain. 02/22/18; he continues to have weight gain. R intake nurse reports again purulent green drainage coming out of the lateral wound on the lateral left calf. He has small open area on the right anterior leg. His torsemide was increased to 2 tablets [name] day I believe this is 40 mg last week  in response to the call admitted to Dr. Doristine Church office. He follows up with Dr. Thedore Mins and Dr. Gilda Crease tomorrow 03/01/18 his weight essentially stable today at 383 pounds. Drainage out of the left lateral wound on the lower left calf/ankle is [name] lot less. Culture last time grew Pseudomonas. I put him on cefdinir. He has been to Dr. Doristine Church office no adjustments in his diuretics. Dr. Dion Saucier prescribed [name] wraparound stocking for the right leg.there is no open area on the right leg. He has [name] superficial area on the left medial calf, left posterior calf and in the large area on the left lateral however this looks better 03/15/18; weight is not up to 393 pounds. He saw his nephrologist yesterday Dr. Thedore Mins will increase the Demadex I'm  hopeful this will help with the edema control. I'm using silver alginate to all his wounds. In particular the left lateral ankle looks better. Unfortunately he has new open areas on the right lateral calf that will include use of his compression stocking at least in the short to medium term. He has new wounds 3 on the right lateral calf. One of these has some size however all numerous superficial 03/22/18; his weight is stabilized [name] bit. Just adjustment of his diuretics by his nephrologist. There is no doubt he has some degree of systemic fluid overload on top of severe left greater than right lymphedema. 2 weeks ago tried to transition him to stockings on the right leg however he developed re-breakdown of skin on the right leg and we had to put him back in compression last week. He also uses external compression pumps and claims to be compliant Continued concern about his depression today 03/29/18; several ongoing issues with this patient; He no longer has home health coverage apparently secondary to [name] lapse in insurance. He is apparently transitioning from short for long-term disability. [facility] was changing his compression wraps on Monday and Friday. He has gained 10 pounds since last week Sees Dr. Lorretta Harp tomorrow Saw his primary doctor last week about the depression. It sounds as though he declined pharmacologic management. He seems somewhat better today. We were concerned last week when he came. Somewhat better today He is using his compression pumps once [name] day at home, I have asked for twice [name] day if possible especially on the left leg Follows up with his nephrologist in mid-August. He is managing his diuretic for I think stage IV chronic renal failure Paradoxically his wounds actually look better 04/19/18; the patient has not been seen since I last saw him 3 weeks ago. He saw Dr. Gilda Crease of vascular surgery on 03/30/18 I believe he put him in [name] 20/30 stocking bilaterally with [name]  wraparound extremitease stocking. He has not been putting anything specifically on the wound. More problematic than that he has not been wearing the stockings he is at home. He has been using his external compression pumps once per day according to him on [name] rare occasion twice On [name] psychosocial level the patient is now on long-term disability and is applying for COBRA therefore he is between insurances. He has not been able to follow up with Dr. Thedore Mins who is his nephrologist as [name] result. As noted his weight is up to 407 pounds today. He promises me he'll follow-up with Dr. Thedore Mins 05/03/18; he hasn't been here in 2 weeks now. He apparently has been wearing [name] compression sock on the left leg. Massive increase in edema 3 large open wounds on the  left anterior leg that were probably blisters. Significant deterioration in the left lateral ankle wound that we've been doing as his most problematic wound. He still does not have his insurance issues wrapped up. His weight is well over 400 pounds. 05/10/18; arrives today with better looking edema control in the left leg. He has been using his compression pumps twice [name] day. We also wrapped it is left leg for there he's been using his pumps so there is much better edema control. Most of new wounds from last week look [name] lot better. Even the refractory area on the lower left lateral ankle looks [name] lot better to me today. He follows up with Dr. Thedore Mins this morning [nephrology] 05/17/18 patient arrives today with [name] lot less edema in the left leg. His weight is gone down 7 pounds. He tells me he saw Dr. Thedore Mins but his torsemide was not adjusted. He is using the palms 3 times [name] day. There is been quite an improvement in the remaining wound on the left lateral ankle Weekly visit for follow-up of bilateral lower extremity wounds related to severe lymphedema and probably some degree of systemic fluid overload from chronic renal failure stage IV. His weight is up this week to over  400 pounds. He noted increasing edema in the right leg late last week. He took his compression stocking off he did not increase the frequency of this compression pump use. He developed [name] large blister on the back of the right calf. He also has [name] new opened blister on the left lateral calf in addition to the wound that we've been using on the distal left lower calf area and we've been using silver alginate. He tells me that he has an ultrasound which is [name] DVT rule out and I think [name] reflux study ordered by Dr. Lorretta Harp 05/31/18; weekly visit. He went to see vein and vascular this week apparently they remove the 4-layer compression on the left and put an Unna boot on him in replacement. He's got more swelling in the left leg is resolved. That being said his left lower Wound is better. He still has [name] fairly large wound on the right posterior calf this was not disturbed. He has [name] follow-up appointment with Dr. Doristine Church nephrologist next week 06/07/2018; the patient arrives today with [name] history that he took both his compression wraps off 3 days ago in order to take [name] shower. He has bilateral severe tense blisters. [name] lot of weeping erythema especially on the lateral left calf. Almost circumferential blisters on the right. Multiple areas of epithelial breakdown. He does not complain of any pain fever chills. He states he has been using his compression pump in some form of stocking that I could not really determine the type. He did not come into the clinic with anything on his legs. He tells me he has an appointment with Dr. Ella Jubilee his nephrologist I believe this Friday. Paradoxically his weight is actually down to 385 pounds I believe last week he was over 400 06/14/18; arrives with better looking wound surfaces today on both legs. There is no further blistering however there are still areas that aren't epithelialized on the right anterior, right lateral, left posterior and left medial. His original wound just above  the left ankle laterally is still open moist. His weight was about the same today. He tells me that his nephrologist increase the torsemide from 40/20/09/1939/40 twice [name] day [place], Oley [name] ([id number]) [id number] Page 3 of 11 06/21/18; both  his wraps fell down to his mid calf. Has [name] result he has multiple blisters across the anterior right leg above with the wraps ultimately watched. He also has blisters on the posterior part of the left calf 2. His original wound on the left lateral calf is hard to see any open area. There is however [name] divot. Which is going to be difficult to deal with into the future until the edema in the left leg is controlled. 2 small superficial areas remain He tells [place] his weight was 386 pounds on his scale at home. According to our scale he is up 5 pounds. He is not on [name] fluid restriction 06/28/18; patient's weight is gone up 2 pounds since last time. He has weeping edema and open superficial wounds on the right posterior right lateral and right anterior calf. He has [name] small open area on the left anterior probably left posterior calf and the original wound on the left lateral calf appears to be just about closed He is being planned for [name] dialysis shunt in the right arm through [facility] incoordination with his nephrologist Dr. Thedore Mins He claims to be using his compression pumps twice [name] day. He has lymphedema and no doubt systemic fluid volume overload from stage IV chronic renal failure 07/04/18; patient's weight is up into the 396 range. He is supposed to see his cardiologist tomorrow and plans are being made for [name] shunt by Dr. Gilda Crease. He still has significant open wounds/draining areas on the right anterior and right posterior calf. Several small open areas on the left which are less in terms of wound area on the right which is surprising given the fact the left is the larger most lymphedematous leg 07/26/2018 She is seen today for  follow-up and management of lateral posterior lower leg wound and bilateral lymphoedema. He has [name] very flat affect and depressed mood today. Unable to elicit much information from him today. No thoughts of harm to self identified. Recently had [name] follow-up with vascular vein for fistula placement to initiate dialysis in the right arm. He has 2 dressings on the right arm from the fistula procedure with no bleeding or drainage noted on the dressing. He does have [name] large amount of bruising in the surrounding to puncture areas on the right lower arm from the fistula placement. No pain elicited with palpation of the right arm. He denies [name] diminished sensation in that right lower arm as well. He does have [name] small wound to the left lateral posterior lower leg. No visual drainage present to either leg. However the wound to the left lower leg does have [name] foul odor even after cleaning it. The left is the larger most lymphedematous leg with the right Leg still having [name] significant amount of edema. No drainage or blisters present on either leg today. He states that he is using the lymphedema pumps twice [name] day. Not too sure if he is compliant with actually using the lymphedema pumps based on the amount of edema that he has in his legs. Weight increased from 396.1 to 401.6. No recent fevers, chills, or shortness of breath. 08/02/18; the patient only has one remaining wound on the left lateral calf which is still open. This is in the resultant [place] shaped area which was once the site of the major wound. He does not have any open areas on the right leg nor additional wounds on the left no blistering. As usual the left leg is much larger than  the right. He comes in today stating that he took the wraps off on the right leg on Friday and since then he has presumably been wearing his stocking which is [name] wraparound variant stocking on the right. States he took the 4-layer compression off his left leg this morning to shower.  He is seeing vein and vascular tomorrow. He states he is using his compression pumps once or twice [name] day. He also admits that he has been on his feet [name] lot 08/09/18; the patient has the left leg healed except for the original wound site on the left medial Just above the ankle. On the right he has [name] new wound on the right lateral calf. He saw vein and vascular last week and he is been back in his pressure stockings and wraparound stockings. He also is been put on metolazone 3 times [name] week by nephrology along with his torsemide. I'm hopeful that this will help with some of the lower extremity edema. I've always felt that he has lymphedema but there may be [name] secondary component contributing 08/16/18; the patient used his own junk slight stockings to both legs last week. He claims he is pumping once to twice [name] day at home. Here rise with his legs looking visibly less edematous. His usual left greater than right secondary to lymphedema. He is tolerating the metolazone 3 times [name] week along with the torsemide. His weight was down 2 pounds. He follows up with nephrology soon for follow-up lab work I think in early January. He does not have any open areas on the right leg he still has the crevice on the lateral left calf just above the ankle there is 2 small open areas here I am not sure that this is ever going to be free of an open area although certainly not worsening. More problematic lead he has 2 blistered areas just above the wrist on the left lateral calf 09/06/2018; the patient arrives with much larger legs than I remember seeing [name] month ago especially on the right. He has 1 open area on the left lateral calf and he had [name] 4 layer compression on this on arrival in the clinic. This was apparently put on by [facility] in response to [name] new open wound. He claims he is pumping once or more often twice [name] day although I really have [name] hard time believing it. He came in with nothing but stockings on his legs. There is  tremendous increase in the edema bilaterally 1/22; the patient only has the one area that is not totally closed and that is the divot on the left lateral calf. I do not think we are going to be able to do anything further to this unless he loses edema fluid in his left leg when he starts dialysis which I gather is sometime soon. He is using his own wraparound compression garment. He is using the compression pumps twice [name] day. 2/19; the patient does not have any open wounds on the right leg but both legs left and the right have extensive edema is usually worse on the left. He has [name] tense blister on the left anterior leg. Using his compression pumps usually once [name] day per his description. He does not come in with the compression stockings 2/26. Right leg remains without any open wounds that he has his own stocking. The tense blister on the left anterior leg is closed over. He still has the open area on the left lateral mid calf and  then the divot injury area which I do not think is going to heal just above the left ankle. 3/11; the patient arrives with all the wounds on both legs healed. He has his stocking with the wraparound juxta lite on the right. He has 1 for the left. He initiated dialysis on Monday and we will see what effect that has on his lower extremity edema. He has his compression pumps and he plans to use them at least once [name] day Readmission 01/20/2022 Mr. Costlow Vanepps is [name] 52 year old male with [name] past medical history of type 1 diabetes on hemodialysis that presents to the clinic for [name] 1 year history of wound to the right side of his neck. He states that he had [name] central line in place when he started hemodialysis while his graft matured. He states that the area where the line was removed never healed. He reports serosanguineous drainage and odor to the area over the past year. He has not taken antibiotics for this issue. He currently denies systemic signs of infection. He currently keeps  the area covered with [name] Band-Aid. 5/31; patient presents for follow-up. He has been taking doxycycline without issues. He had [name] culture done at last clinic visit that showed abundant Streptococcus anginosus sensitive to penicillin. I will go ahead and switch him to Augmentin. He has been using mupirocin ointment to the wound bed and covering this with Hydrofera Blue. Upon inspection today there is [name] piece of dressing tightly adhered in the wound bed that appears to be connected to underlying structures. He obtains his neck ultrasound tomorrow. He denies systemic signs of infection. 6/14; Patient presents for follow-up. Patient had an ultrasound to his neck that showed [name] prior PermCath still present is [name] retained foreign body. This was causing his wound not to heal. On 6/7 the foreign body was removed by Dr.Schnier. Since then the area has healed up nicely. He has no issues or complaints today. He denies any drainage, open wounds or discomfort to the previous wound site. 02/16/2023 Mr. Daimian Pregler is [name] 52 year old male with [name] past medical history of end-stage renal disease on hemodialysis, Type 2 diabetes, And lymphedema/venous insufficiency That presents to the clinic for evaluation of his right lower extremity. He has wounds to his left lower extremity for several months but these are being managed by vein and vascular with [name] wound VAC. On 02/04/2023 patient was evaluated for new blisters to the right lower extremity and potential cellulitis at vein and vascular and was recommended to go to the ED. He was started on oral antibiotics by the ED and referred to our clinic for follow-up to assure that the right leg wounds improved. He had an Unna boot placed by vein and vascular two days prior. He has home health that changes the unna boot and wound vac 2 times weekly. He currently denies signs of infection. There are no open wounds to the right lower extremity. 7/10; Patient was seen last month for left  lower extremity wounds however this was in the care of vein and vascular and I recommended he discuss with his doctor if he would like to transfer care to the wound care center. At this time patient states that he would like to follow here. He has been using [name] wound VAC to the left lower extremity large wound and he has been keeping the area covered to the dorsal left foot wound. He would like to stop the wound VAC as He has had trouble  with the seal. He currently denies systemic signs of infection. 7/17; patient presents for follow-up. We have been using Dakin's wet-to-dry dressings under Kerlix/Coban to the left lower extremity large wound and Silver alginate to the left dorsal foot wound. Wounds are smaller. Patient has no issues or complaints. 7/24; patient presents for follow-up. We have been using Dakin's wet-to-dry dressings to the larger wound and silver alginate to the smaller dorsal foot wound to the left lower extremity all under Kerlix/Coban. Wounds are smaller today. Patient has no issues or complaints. Originally patient had [name] wound VAC. He still has this in his possession but we are not using it. I recommended He returned this. He would also like new lymphedema pumps. KAYEN, LEESE [name] ([id number]) [id number] Page 4 of 11 7/31; patient presents for follow-up. We have been using Dakin's wet-to-dry dressings under Kerlix/Coban to the left lower extremity larger wound and silver alginate to the smaller dorsal left foot wound. He was noted to have [name] fever during dialysis and was found to be bacteremic. He has been in the hospital for the past few days. He is receiving IV cefazolin during dialysis now. He has no issues or complaints about the wounds. The left dorsal foot wound is healed today. He currently denies systemic signs of infection. 8/7; patient presents for follow-up. We have been using silver alginate under Kerlix/Coban to the left lower extremity. Wound is  smaller. 8/14; this patient has [name] reasonably large wound on the left leg. Dimensions are marginally impaired. He has chronic venous insufficiency and his edema is poorly controlled. We have been using kerlix Coban compression. Patient also has external compression pumps at home but he has not been using them. He also has dialysis Tuesday Thursdays and Saturdays 8/21; patient presents for follow-up. We have been using silver cell under compression therapy. The compression wrap was increased to 3 layer at last clinic visit. He states he tolerated this well. He has no issues or complaints today. Wound is smaller. 8/28; patient presents for follow-up. We have been using silver alginate under compression therapy to the left lower extremity. Wound appears well-healing. Unfortunately has developed another wound just lateral to this that looks like it started out as [name] blister. 9/4; patient presents for follow-up. We have been using silver alginate under compression therapy to the left lower extremity. Wound is smaller. New wound at last clinic visit on the lateral aspect has closed. 9/11; patient presents for follow-up. We have been using silver alginate under compression therapy to the left lower extremity. Wound continues to become smaller. 9/18; patient with longstanding chronic lymphedema. Wound on the left medial lower leg. We have been using silver alginate under Urgo K2 lite. The wound is improved in terms of appearance and measurements per our intake nurse 9/25; chronic longstanding lymphedema. Wound on the left medial lower leg. Measurements are better this week. Last week I think I changed him to [facility] still under and Urgo K2 lite 10/2; chronic longstanding lymphedema with [name] weak wound on the left anterior medial lower leg. Not much change in dimension this week slightly hyper granulated. I thought I had changed him to [facility] however according to our intake nurse he has been  receiving silver alginate. We are using an Urgo K2 light compression because TBI's on the left were only 0.33 is ABIs were noncompressible. He follows with vein and vascular for his arterial insufficiency 10/9; wound is measuring smaller on the left anterior lower leg. We have been  using Hydrofera Blue under Urgo K2 compression. He tells me that he is getting/has received new compression pumps I have asked him to use it 1 hour twice [name] day even over our compression wraps. 10/16; we have been using Hydrofera Blue under Urgo K2 compression. Wound is measuring smaller and looks reasonably healthy. Unfortunately he did not get [name] chance to consistently use the compression pumps twice [name] day. 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 Furthermore he works nights 10/23; Hydrofera Blue under Urgo K2 compression for [name] wound on his left medial lower leg. The wound measures smaller today. Noticeable improvement in the edema he tells me he is using his external compression pumps once [name] day at least sometimes twice. He has [name] difficult work schedule and works nights 10/30; we are using Hydrofera Blue under Urgo K2 compression. Wound continues to gradually get smaller. He is using his compression pumps at home once [name] day 07-06-2023 upon evaluation today patient appears to be doing well currently in regard to his wound. This is actually showing signs of excellent epithelization and granulation at this point. Fortunately I do not see any evidence of worsening overall and I do believe that the patient is making good headway here towards closure. 07-11-2023 upon evaluation today patient appears to be doing well currently in regard to his wound. He does seem to be somewhat drying the skin around his as well I think that we may want to switch to Tubigrip since he does well with this and subsequently switch to collagen as well which should allow this to be able to heal faster with the benefit of  the collagen. 07-20-2023 upon evaluation today patient's wound unfortunately is showing signs of being [name] bit worse. He notes that he is having some issues here with areas that he scratch that are now open. This is definitely not what we are looking for. I discussed with him that he needs to make sure that he is not scratching these areas. The region is scar tissue and this means that he is gena have [name] lot of issues if he scratches it with skin breakdown which is not what we are looking for at all. The patient voiced understanding. 07-27-2023 upon evaluation patient appears to be doing excellent in regard to his leg ulcer I feel like he is getting very close to complete resolution. Fortunately I do not see any evidence of worsening overall and I believe that the patient is making really good headway here towards closure which is great news. 08-10-2023 upon evaluation today patient appears to be doing well currently in regard to his leg ulcer. Has been tolerating the dressing changes without complication and the good news is he actually is doing quite well in fact he appears to be pretty much completely healed. I do not see any signs of infection at this time. 08-17-2023 upon evaluation today patient appears to be doing well currently in regard to his wound. He has been tolerating the dressing changes without complication. Fortunately there does not appear to be any signs of active infection at this time which is great news. No fevers, chills, nausea, vomiting, or diarrhea. Electronic Signature(s) Signed: 08/17/2023 8:32:39 AM By: Allen Derry PA-C Entered By: Allen Derry on 08/17/2023 05:32:39 Simonin, Cayne [name] ([id number]) [id number] Page 5 of 11 -------------------------------------------------------------------------------- Physical Exam Details Patient Name: Date of Service: [name], [name] 08/17/2023 8:00 [name] Medical Record Number: [id number]  Patient Account  Number: [id number] Date of Birth/Sex: Treating RN: 1971/07/04 (52 y.o. [name] Primary Care Provider: [name] Other Clinician: Referring Provider: Treating Provider/Extender: [name] in Treatment: 26 Constitutional Obese and well-hydrated in no acute distress. Respiratory normal breathing without difficulty. Psychiatric this patient is able to make decisions and demonstrates good insight into disease process. Alert and Oriented x 3. pleasant and cooperative. Notes Upon inspection patient's wound bed actually showed signs of good granulation and epithelization at this point. Fortunately I do not see any signs of worsening overall and to be honest I think that the patient has healed quite nicely. He did have [name] small blister that happened on the anterior portion of his leg but as I told him he is gena have small blisters that occur here and there. Nonetheless I believe based on what we are seeing that this is seeming to do much better at this point. And even with that blister I think that he is done quite well to be honest. This actually appears to be pretty much healed already based on what I am seeing. For that reason I think that we are going to continue with the plan currently as far as the Tubigrip is concerned which seems to be doing quite well and any blisters that pop up he can use some of his alginate over. Electronic Signature(s) Signed: 08/17/2023 8:34:45 AM By: Allen Derry PA-C Entered By: Allen Derry on 08/17/2023 05:34:45 -------------------------------------------------------------------------------- Physician Orders Details Patient Name: Date of Service: [name], [name] 08/17/2023 8:00 [name] Medical Record Number: [id number] Patient Account Number: [id number] Date of Birth/Sex: Treating RN: Jul 24, 1971 (52 y.o. [name]) [name] Primary Care Provider: [name] Other Clinician: Referring Provider: Treating Provider/Extender:  [name] in Treatment: 26 The following information was scribed by: [name] The information was scribed for: Allen Derry Verbal / Phone Orders: No Diagnosis Coding ICD-10 Coding Code Description [phone] Non-pressure chronic ulcer of other part of left lower leg with fat layer exposed I87.312 Chronic venous hypertension (idiopathic) with ulcer of left lower extremity I89.0 Lymphedema, not elsewhere classified I87.321 Chronic venous hypertension (idiopathic) with inflammation of right lower extremity E11.622 Type 2 diabetes mellitus with other skin ulcer Follow-up Appointments Return Appointment in 3 weeks. Edema Control - Orders / Instructions Elevate, Exercise Daily and [name] void Standing for Long Periods of Time. Patient to wear own Velcro compression garment. Remove compression stockings every night before going to bed and put on every morning when getting up. Elevate legs to the level of the heart and pump ankles as often as possible - left and right Prioleau, Braylan [name] ([id number]) [id number] Page 6 of 11 Elevate leg(s) parallel to the floor when sitting. Compression Pump: Use compression pump on left lower extremity for 60 minutes, twice daily. Compression Pump: Use compression pump on right lower extremity for 60 minutes, twice daily. Electronic Signature(s) Signed: 08/19/2023 11:21:11 AM By: [name] RN Signed: 08/19/2023 12:16:54 PM By: Allen Derry PA-C Previous Signature: 08/17/2023 8:39:12 AM Version By: [name] RN Entered By: [name] on 08/17/2023 05:41:52 -------------------------------------------------------------------------------- Problem List Details Patient Name: Date of Service: [name], [name] 08/17/2023 8:00 [name] Medical Record Number: [id number] Patient Account Number: [id number] Date of Birth/Sex: Treating RN: Jun 01, 1971 (52 y.o. [name] Primary Care Provider: [name] Other  Clinician: Referring Provider: Treating Provider/Extender: [name] in Treatment: 26 Active Problems ICD-10 Encounter Code Description  Active Date MDM Diagnosis L97.822 Non-pressure chronic ulcer of other part of left lower leg with fat layer exposed6/19/2024 No Yes I87.312 Chronic venous hypertension (idiopathic) with ulcer of left lower extremity 02/16/2023 No Yes I89.0 Lymphedema, not elsewhere classified 03/23/2023 No Yes I87.321 Chronic venous hypertension (idiopathic) with inflammation of right lower 02/16/2023 No Yes extremity E11.622 Type 2 diabetes mellitus with other skin ulcer 02/16/2023 No Yes Inactive Problems ICD-10 Code Description Active Date Inactive Date L97.522 Non-pressure chronic ulcer of other part of left foot with fat layer exposed 03/16/2023 03/16/2023 Resolved Problems Electronic Signature(s) Signed: 08/17/2023 8:32:26 AM By: Allen Derry PA-C Entered By: Allen Derry on 08/17/2023 05:32:26 Felber, Kashaun [name] ([id number]) [id number] Page 7 of 11 -------------------------------------------------------------------------------- Progress Note Details Patient Name: Date of Service: [name], [name] 08/17/2023 8:00 [name] Medical Record Number: [id number] Patient Account Number: [id number] Date of Birth/Sex: Treating RN: 03-03-1971 (52 y.o. [name] Primary Care Provider: [name] Other Clinician: Referring Provider: Treating Provider/Extender: [name] in Treatment: 26 Subjective Chief Complaint Information obtained from Patient Left LE Ulcer History of Present Illness (HPI) 12/28/17; this is [name] now 52 year old man who is [name] type II diabetic. He was hospitalized from 10/01/17 through 10/19/17. He had an MSSA soft tissue and skin infection. 2 open areas on the left leg were identified he has [name] smaller area on the left medial calf superiorly just below the knee and [name] wound just above  the left ankle on the posterior medial aspect. I think both of these were surgical IandD sites when he was in the hospital. He was discharged with [name] wound VAC at that point however this is since been taken off. He follows with Dr. Sampson Goon for the [facility] and he is still on chronic Keflex at 500 twice [name] day. At that time he was hospitalized his hemoglobin A1c was 15.1 however if I'm reading his endocrinologist notes correctly that is improved. He has been following with Dr. Lorretta Harp at vein and vascular and he has been applying calcium alginate and Unna boots. He has home health changing the dressing. They have also been attempting to get him external compression pumps although the patient is unaware whether they've been approved by insurance at this point. as mentioned he has [name] smaller clean wound on the right lateral calf just below the knee and he has [name] much larger area just above the left ankle medially and posteriorly. Our intake nurse reported greenish purulent looking drainage.the patient did have surgical material sent to pathology in February. This showed chronic abscess The patient also has lymphedema stage III in the left greater than right lower extremities. He has [name] history of blisters with wounds but these of all were always healed. The patient thinks that the lymphedema may have been present since he was about 52 years old i.e. about 30 years. He does not have graded pressure stockings and has not worn stockings. He does not have [name] distant history of DVT PE or phlebitis. He has not been systemically unwell fever no chills. He states that his Lasix is recently been reduced. He tells me his kidney function is at "30%" and he has been followed by Dr. Thedore Mins of nephrology. At one point he was on Lasix 80 twice [name] day however that's been cut back and he is now on Lasix at 20 twice [name] day. The patient has [name] history of PAD listed in his records although he comes from Dr. Lorretta Harp I don't  think is  felt to have significant PAD. ABIs in our clinic were noncompressible bilaterally. 01/04/18; patient has [name] large wound on the left lateral lower calf and [name] small wound on the left medial upper calf. He has been to see his nephrologist who changed him to Demadex 40 mg [name] day. I'm hopeful this will help with his systemic fluid overload. He also has stage III lymphedema. Really no change in the 2 wounds since last week 01/11/18; the patient is down 13 pounds. He put his stage III lymphedema left leg in 4K compression last week and there is less edema fluid however we still haven't been able to communicate with home health but apparently it is [facility] but the dressings have not been changed. The patient noted an odor last week. He is also had compression pumps ordered by [facility] this as [name] not completed the paperwork stage. 01/18/18; patient continues to lose weight. Stage III lymphedema in the left greater than right leg under for alert compression. The major wound is on the left lateral ankle area. He apparently has bilateral compression pumps being brought to his house, these were ordered by [facility] Notable for the fact today he had some blisters on the right anterior leg together with some skin nodules. This is no doubt secondary to severe lymphedema. 01/25/18; the patient has obtained his compression pumps and is using them per vein and vascular instructions 3 times [name] day for an hour. He also saw [name] of vein and vascular. He was felt to have venous insufficiency but did not suggest any intervention also improved edema. It was suggested that he have compression stockings 20-30 mm on [name] daily basis in addition to compression pumps. The patient arrives in clinic today with [name] layer of unna under for layer compression. he seems to have some trouble with the degree of compression. He has open areas on the left lateral ankle area which is his major wound left upper medial  calf and [name] superficial open area on the right anterior shin area which was blistered last week. He has skin changes on the right anterior calf which I think are no doubt secondary to lymphedema skin nodules etc. 02/01/18; the patient comes in telling us his nephrologist have to his torsemide. Unfortunately today he is put on 7 pounds by our scales. He has blisters all over the anterior and medial part of his right calf and [name] new open wound. He also has soupy green drainage coming out of the left lateral calf /ankle wound. 02/08/18; culture I did last week of the left lateral ankle wound grew both Pseudomonas and Morganella. He is on Keflex from Dr. Sampson Goon in the hospital. I will need to review these notes.in any case Keflex is not going to cover these 2 organisms. I'm probably going to added ciprofloxacin today for 1 week. [name] lot of drainage that looks purulent last week. He is not complaining of pain however he has managed to put on 10 pounds in 2 weeks by our scales in this clinic. He is going to see his nephrologist tomorrow 02/15/18; he completed the ciprofloxacin I gave him last week. Notable that he is up to 379 pounds today which is up 16 pounds from 2 weeks ago. He is complaining of orthopnea but doesn't have any chest pain. 02/22/18; he continues to have weight gain. R intake nurse reports again purulent green drainage coming out of the lateral wound on the lateral left calf.  He has small open area on the right anterior leg. His torsemide was increased to 2 tablets [name] day I believe this is 40 mg last week in response to the call admitted to Dr. Doristine Church office. He follows up with Dr. Thedore Mins and Dr. Gilda Crease tomorrow 03/01/18 his weight essentially stable today at 383 pounds. Drainage out of the left lateral wound on the lower left calf/ankle is [name] lot less. Culture last time grew Pseudomonas. I put him on cefdinir. He has been to Dr. Doristine Church office no adjustments in his diuretics. Dr. Dion Saucier prescribed  [name] wraparound stocking for the right leg.there is no open area on the right leg. He has [name] superficial area on the left medial calf, left posterior calf and in the large area on the left lateral however this looks better 03/15/18; weight is not up to 393 pounds. He saw his nephrologist yesterday Dr. Thedore Mins will increase the Demadex I'm hopeful this will help with the edema control. I'm using silver alginate to all his wounds. In particular the left lateral ankle looks better. Unfortunately he has new open areas on the right lateral calf that will include use of his compression stocking at least in the short to medium term. He has Laurich, Raylee [name] ([id number]) [id number] Page 8 of 11 new wounds 3 on the right lateral calf. One of these has some size however all numerous superficial 03/22/18; his weight is stabilized [name] bit. Just adjustment of his diuretics by his nephrologist. There is no doubt he has some degree of systemic fluid overload on top of severe left greater than right lymphedema. 2 weeks ago tried to transition him to stockings on the right leg however he developed re-breakdown of skin on the right leg and we had to put him back in compression last week. He also uses external compression pumps and claims to be compliant Continued concern about his depression today 03/29/18; several ongoing issues with this patient; He no longer has home health coverage apparently secondary to [name] lapse in insurance. He is apparently transitioning from short for long-term disability. [facility] was changing his compression wraps on Monday and Friday. He has gained 10 pounds since last week Sees Dr. Lorretta Harp tomorrow Saw his primary doctor last week about the depression. It sounds as though he declined pharmacologic management. He seems somewhat better today. We were concerned last week when he came. Somewhat better today He is using his compression pumps once [name] day at home, I have  asked for twice [name] day if possible especially on the left leg Follows up with his nephrologist in mid-August. He is managing his diuretic for I think stage IV chronic renal failure Paradoxically his wounds actually look better 04/19/18; the patient has not been seen since I last saw him 3 weeks ago. He saw Dr. Gilda Crease of vascular surgery on 03/30/18 I believe he put him in [name] 20/30 stocking bilaterally with [name] wraparound extremitease stocking. He has not been putting anything specifically on the wound. More problematic than that he has not been wearing the stockings he is at home. He has been using his external compression pumps once per day according to him on [name] rare occasion twice On [name] psychosocial level the patient is now on long-term disability and is applying for COBRA therefore he is between insurances. He has not been able to follow up with Dr. Thedore Mins who is his nephrologist as [name] result. As noted his weight is up to 407 pounds today. He promises me  he'll follow-up with Dr. Thedore Mins 05/03/18; he hasn't been here in 2 weeks now. He apparently has been wearing [name] compression sock on the left leg. Massive increase in edema 3 large open wounds on the left anterior leg that were probably blisters. Significant deterioration in the left lateral ankle wound that we've been doing as his most problematic wound. He still does not have his insurance issues wrapped up. His weight is well over 400 pounds. 05/10/18; arrives today with better looking edema control in the left leg. He has been using his compression pumps twice [name] day. We also wrapped it is left leg for there he's been using his pumps so there is much better edema control. Most of new wounds from last week look [name] lot better. Even the refractory area on the lower left lateral ankle looks [name] lot better to me today. He follows up with Dr. Thedore Mins this morning [nephrology] 05/17/18 patient arrives today with [name] lot less edema in the left leg. His weight is gone down 7  pounds. He tells me he saw Dr. Thedore Mins but his torsemide was not adjusted. He is using the palms 3 times [name] day. There is been quite an improvement in the remaining wound on the left lateral ankle Weekly visit for follow-up of bilateral lower extremity wounds related to severe lymphedema and probably some degree of systemic fluid overload from chronic renal failure stage IV. His weight is up this week to over 400 pounds. He noted increasing edema in the right leg late last week. He took his compression stocking off he did not increase the frequency of this compression pump use. He developed [name] large blister on the back of the right calf. He also has [name] new opened blister on the left lateral calf in addition to the wound that we've been using on the distal left lower calf area and we've been using silver alginate. He tells me that he has an ultrasound which is [name] DVT rule out and I think [name] reflux study ordered by Dr. Lorretta Harp 05/31/18; weekly visit. He went to see vein and vascular this week apparently they remove the 4-layer compression on the left and put an Unna boot on him in replacement. He's got more swelling in the left leg is resolved. That being said his left lower Wound is better. He still has [name] fairly large wound on the right posterior calf this was not disturbed. He has [name] follow-up appointment with Dr. Doristine Church nephrologist next week 06/07/2018; the patient arrives today with [name] history that he took both his compression wraps off 3 days ago in order to take [name] shower. He has bilateral severe tense blisters. [name] lot of weeping erythema especially on the lateral left calf. Almost circumferential blisters on the right. Multiple areas of epithelial breakdown. He does not complain of any pain fever chills. He states he has been using his compression pump in some form of stocking that I could not really determine the type. He did not come into the clinic with anything on his legs. He tells me he has an  appointment with Dr. Ella Jubilee his nephrologist I believe this Friday. Paradoxically his weight is actually down to 385 pounds I believe last week he was over 400 06/14/18; arrives with better looking wound surfaces today on both legs. There is no further blistering however there are still areas that aren't epithelialized on the right anterior, right lateral, left posterior and left medial. His original wound just above the left ankle laterally  is still open moist. His weight was about the same today. He tells me that his nephrologist increase the torsemide from 40/20/09/1939/40 twice [name] day 06/21/18; both his wraps fell down to his mid calf. Has [name] result he has multiple blisters across the anterior right leg above with the wraps ultimately watched. He also has blisters on the posterior part of the left calf 2. His original wound on the left lateral calf is hard to see any open area. There is however [name] divot. Which is going to be difficult to deal with into the future until the edema in the left leg is controlled. 2 small superficial areas remain He tells [place] his weight was 386 pounds on his scale at home. According to our scale he is up 5 pounds. He is not on [name] fluid restriction 06/28/18; patient's weight is gone up 2 pounds since last time. He has weeping edema and open superficial wounds on the right posterior right lateral and right anterior calf. He has [name] small open area on the left anterior probably left posterior calf and the original wound on the left lateral calf appears to be just about closed He is being planned for [name] dialysis shunt in the right arm through vein and vascular incoordination with his nephrologist Dr. Thedore Mins He claims to be using his compression pumps twice [name] day. He has lymphedema and no doubt systemic fluid volume overload from stage IV chronic renal failure 07/04/18; patient's weight is up into the 396 range. He is supposed to see his cardiologist tomorrow and plans are being made  for [name] shunt by Dr. Gilda Crease. He still has significant open wounds/draining areas on the right anterior and right posterior calf. Several small open areas on the left which are less in terms of wound area on the right which is surprising given the fact the left is the larger most lymphedematous leg 07/26/2018 She is seen today for follow-up and management of lateral posterior lower leg wound and bilateral lymphoedema. He has [name] very flat affect and depressed mood today. Unable to elicit much information from him today. No thoughts of harm to self identified. Recently had [name] follow-up with vascular vein for fistula placement to initiate dialysis in the right arm. He has 2 dressings on the right arm from the fistula procedure with no bleeding or drainage noted on the dressing. He does have [name] large amount of bruising in the surrounding to puncture areas on the right lower arm from the fistula placement. No pain elicited with palpation of the right arm. He denies [name] diminished sensation in that right lower arm as well. He does have [name] small wound to the left lateral posterior lower leg. No visual drainage present to either leg. However the wound to the left lower leg does have [name] foul odor even after cleaning it. The left is the larger most lymphedematous leg with the right Leg still having [name] significant amount of edema. No drainage or blisters present on either leg today. He states that he is using the lymphedema pumps twice [name] day. Not too sure if he is compliant with actually using the lymphedema pumps based on the amount of edema that he has in his legs. Weight increased from 396.1 to 401.6. No recent fevers, chills, or shortness of breath. 08/02/18; the patient only has one remaining wound on the left lateral calf which is still open. This is in the resultant [place] shaped area which was once the site of the major wound. He  does not have any open areas on the right leg nor additional wounds on the left no  blistering. As usual the left leg is much larger than the right. He comes in today stating that he took the wraps off on the right leg on Friday and since then he has presumably been wearing his stocking which is [name] wraparound variant stocking on the right. States he took the 4-layer compression off his left leg this morning to shower. He is seeing vein and vascular tomorrow. He states he is using his compression pumps once or twice [name] day. He also admits that he has been on his feet [name] lot 08/09/18; the patient has the left leg healed except for the original wound site on the left medial Just above the ankle. On the right he has [name] new wound on the right lateral calf. He saw vein and vascular last week and he is been back in his pressure stockings and wraparound stockings. He also is been put on metolazone 3 times [name] week by nephrology along with his torsemide. I'm hopeful that this will help with some of the lower extremity edema. I've always felt that he has lymphedema but there may be [name] secondary component contributing 08/16/18; the patient used his own junk slight stockings to both legs last week. He claims he is pumping once to twice [name] day at home. Here rise with his legs looking visibly less edematous. His usual left greater than right secondary to lymphedema. He is tolerating the metolazone 3 times [name] week along with the torsemide. His weight was down 2 pounds. He follows up with nephrology soon for follow-up lab work I think in early January. He does not have any open areas Fulp, [name] ([id number]) [id number] Page 9 of 11 on the right leg he still has the crevice on the lateral left calf just above the ankle there is 2 small open areas here I am not sure that this is ever going to be free of an open area although certainly not worsening. More problematic lead he has 2 blistered areas just above the wrist on the left lateral calf 09/06/2018; the patient arrives with much  larger legs than I remember seeing [name] month ago especially on the right. He has 1 open area on the left lateral calf and he had [name] 4 layer compression on this on arrival in the clinic. This was apparently put on by [facility] in response to [name] new open wound. He claims he is pumping once or more often twice [name] day although I really have [name] hard time believing it. He came in with nothing but stockings on his legs. There is tremendous increase in the edema bilaterally 1/22; the patient only has the one area that is not totally closed and that is the divot on the left lateral calf. I do not think we are going to be able to do anything further to this unless he loses edema fluid in his left leg when he starts dialysis which I gather is sometime soon. He is using his own wraparound compression garment. He is using the compression pumps twice [name] day. 2/19; the patient does not have any open wounds on the right leg but both legs left and the right have extensive edema is usually worse on the left. He has [name] tense blister on the left anterior leg. Using his compression pumps usually once [name] day per his description. He does not come in with the compression stockings 2/26. Right  leg remains without any open wounds that he has his own stocking. The tense blister on the left anterior leg is closed over. He still has the open area on the left lateral mid calf and then the divot injury area which I do not think is going to heal just above the left ankle. 3/11; the patient arrives with all the wounds on both legs healed. He has his stocking with the wraparound juxta lite on the right. He has 1 for the left. He initiated dialysis on Monday and we will see what effect that has on his lower extremity edema. He has his compression pumps and he plans to use them at least once [name] day Readmission 01/20/2022 [name] is [name] 52 year old male with [name] past medical history of type 1 diabetes on hemodialysis that presents to the clinic  for [name] 1 year history of wound to the right side of his neck. He states that he had [name] central line in place when he started hemodialysis while his graft matured. He states that the area where the line was removed never healed. He reports serosanguineous drainage and odor to the area over the past year. He has not taken antibiotics for this issue. He currently denies systemic signs of infection. He currently keeps the area covered with [name] Band-Aid. 5/31; patient presents for follow-up. He has been taking doxycycline without issues. He had [name] culture done at last clinic visit that showed abundant Streptococcus anginosus sensitive to penicillin. I will go ahead and switch him to Augmentin. He has been using mupirocin ointment to the wound bed and covering this with Hydrofera Blue. Upon inspection today there is [name] piece of dressing tightly adhered in the wound bed that appears to be connected to underlying structures. He obtains his neck ultrasound tomorrow. He denies systemic signs of infection. 6/14; Patient presents for follow-up. Patient had an ultrasound to his neck that showed [name] prior PermCath still present is [name] retained foreign body. This was causing his wound not to heal. On 6/7 the foreign body was removed by Dr.Schnier. Since then the area has healed up nicely. He has no issues or complaints today. He denies any drainage, open wounds or discomfort to the previous wound site. 02/16/2023 [name] is [name] 52 year old male with [name] past medical history of end-stage renal disease on hemodialysis, Type 2 diabetes, And lymphedema/venous insufficiency That presents to the clinic for evaluation of his right lower extremity. He has wounds to his left lower extremity for several months but these are being managed by vein and vascular with [name] wound VAC. On 02/04/2023 patient was evaluated for new blisters to the right lower extremity and potential cellulitis at vein and vascular and was recommended to go to the  ED. He was started on oral antibiotics by the ED and referred to our clinic for follow-up to assure that the right leg wounds improved. He had an Unna boot placed by vein and vascular two days prior. He has home health that changes the unna boot and wound vac 2 times weekly. He currently denies signs of infection. There are no open wounds to the right lower extremity. 7/10; Patient was seen last month for left lower extremity wounds however this was in the care of vein and vascular and I recommended he discuss with his doctor if he would like to transfer care to the wound care center. At this time patient states that he would like to follow here. He has been using [name]  wound VAC to the left lower extremity large wound and he has been keeping the area covered to the dorsal left foot wound. He would like to stop the wound VAC as He has had trouble with the seal. He currently denies systemic signs of infection. 7/17; patient presents for follow-up. We have been using Dakin's wet-to-dry dressings under Kerlix/Coban to the left lower extremity large wound and Silver alginate to the left dorsal foot wound. Wounds are smaller. Patient has no issues or complaints. 7/24; patient presents for follow-up. We have been using Dakin's wet-to-dry dressings to the larger wound and silver alginate to the smaller dorsal foot wound to the left lower extremity all under Kerlix/Coban. Wounds are smaller today. Patient has no issues or complaints. Originally patient had [name] wound VAC. He still has this in his possession but we are not using it. I recommended He returned this. He would also like new lymphedema pumps. 7/31; patient presents for follow-up. We have been using Dakin's wet-to-dry dressings under Kerlix/Coban to the left lower extremity larger wound and silver alginate to the smaller dorsal left foot wound. He was noted to have [name] fever during dialysis and was found to be bacteremic. He has been in the hospital for the  past few days. He is receiving IV cefazolin during dialysis now. He has no issues or complaints about the wounds. The left dorsal foot wound is healed today. He currently denies systemic signs of infection. 8/7; patient presents for follow-up. We have been using silver alginate under Kerlix/Coban to the left lower extremity. Wound is smaller. 8/14; this patient has [name] reasonably large wound on the left leg. Dimensions are marginally impaired. He has chronic venous insufficiency and his edema is poorly controlled. We have been using kerlix Coban compression. Patient also has external compression pumps at home but he has not been using them. He also has dialysis Tuesday Thursdays and Saturdays 8/21; patient presents for follow-up. We have been using silver cell under compression therapy. The compression wrap was increased to 3 layer at last clinic visit. He states he tolerated this well. He has no issues or complaints today. Wound is smaller. 8/28; patient presents for follow-up. We have been using silver alginate under compression therapy to the left lower extremity. Wound appears well-healing. Unfortunately has developed another wound just lateral to this that looks like it started out as [name] blister. 9/4; patient presents for follow-up. We have been using silver alginate under compression therapy to the left lower extremity. Wound is smaller. New wound at last clinic visit on the lateral aspect has closed. 9/11; patient presents for follow-up. We have been using silver alginate under compression therapy to the left lower extremity. Wound continues to become smaller. 9/18; patient with longstanding chronic lymphedema. Wound on the left medial lower leg. We have been using silver alginate under Urgo K2 lite. The wound is improved in terms of appearance and measurements per our intake nurse 9/25; chronic longstanding lymphedema. Wound on the left medial lower leg. Measurements are better this week. Last  week I think I changed him to [facility] still under and Urgo K2 lite 10/2; chronic longstanding lymphedema with [name] weak wound on the left anterior medial lower leg. Not much change in dimension this week slightly hyper granulated. I thought I had changed him to [facility] however according to our intake nurse he has been receiving silver alginate. We are using an Urgo K2 light compression because TBI's on the left were only 0.33 is  ABIs were noncompressible. He follows with vein and vascular for his arterial insufficiency 10/9; wound is measuring smaller on the left anterior lower leg. We have been using Hydrofera Blue under Urgo K2 compression. He tells me that he is getting/has received new compression pumps I have asked him to use it 1 hour twice [name] day even over our compression wraps. 10/16; we have been using Hydrofera Blue under Urgo K2 compression. Wound is measuring smaller and looks reasonably healthy. Unfortunately he did not get [name] chance to consistently use the compression pumps twice [name] day. 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 Furthermore he works nights [name], [name] [id number] Page 10 of 11 10/23; Hydrofera Blue under Urgo K2 compression for [name] wound on his left medial lower leg. The wound measures smaller today. Noticeable improvement in the edema he tells me he is using his external compression pumps once [name] day at least sometimes twice. He has [name] difficult work schedule and works nights 10/30; we are using Hydrofera Blue under Urgo K2 compression. Wound continues to gradually get smaller. He is using his compression pumps at home once [name] day 07-06-2023 upon evaluation today patient appears to be doing well currently in regard to his wound. This is actually showing signs of excellent epithelization and granulation at this point. Fortunately I do not see any evidence of worsening overall and I do believe  that the patient is making good headway here towards closure. 07-11-2023 upon evaluation today patient appears to be doing well currently in regard to his wound. He does seem to be somewhat drying the skin around his as well I think that we may want to switch to Tubigrip since he does well with this and subsequently switch to collagen as well which should allow this to be able to heal faster with the benefit of the collagen. 07-20-2023 upon evaluation today patient's wound unfortunately is showing signs of being [name] bit worse. He notes that he is having some issues here with areas that he scratch that are now open. This is definitely not what we are looking for. I discussed with him that he needs to make sure that he is not scratching these areas. The region is scar tissue and this means that he is gena have [name] lot of issues if he scratches it with skin breakdown which is not what we are looking for at all. The patient voiced understanding. 07-27-2023 upon evaluation patient appears to be doing excellent in regard to his leg ulcer I feel like he is getting very close to complete resolution. Fortunately I do not see any evidence of worsening overall and I believe that the patient is making really good headway here towards closure which is great news. 08-10-2023 upon evaluation today patient appears to be doing well currently in regard to his leg ulcer. Has been tolerating the dressing changes without complication and the good news is he actually is doing quite well in fact he appears to be pretty much completely healed. I do not see any signs of infection at this time. 08-17-2023 upon evaluation today patient appears to be doing well currently in regard to his wound. He has been tolerating the dressing changes without complication. Fortunately there does not appear to be any signs of active infection at this time which is great news. No fevers, chills, nausea, vomiting,  or diarrhea. Objective Constitutional Obese and well-hydrated in no acute distress. Vitals Time Taken:  8:12 AM, Height: 76 in, Weight: 400 lbs, BMI: 48.7, Temperature: 97.9 F, Pulse: 86 bpm, Respiratory Rate: 18 breaths/min, Blood Pressure: 155/93 mmHg. Respiratory normal breathing without difficulty. Psychiatric this patient is able to make decisions and demonstrates good insight into disease process. Alert and Oriented x 3. pleasant and cooperative. General Notes: Upon inspection patient's wound bed actually showed signs of good granulation and epithelization at this point. Fortunately I do not see any signs of worsening overall and to be honest I think that the patient has healed quite nicely. He did have [name] small blister that happened on the anterior portion of his leg but as I told him he is gena have small blisters that occur here and there. Nonetheless I believe based on what we are seeing that this is seeming to do much better at this point. And even with that blister I think that he is done quite well to be honest. This actually appears to be pretty much healed already based on what I am seeing. For that reason I think that we are going to continue with the plan currently as far as the Tubigrip is concerned which seems to be doing quite well and any blisters that pop up he can use some of his alginate over. Integumentary (Hair, Skin) Wound #22 status is Healed - Epithelialized. Original cause of wound was Gradually Appeared. The date acquired was: 08/30/2022. The wound has been in treatment 26 weeks. The wound is located on the Left Lower Leg. The wound measures 0cm length x 0cm width x 0cm depth; 0cm^2 area and 0cm^3 volume. There is no tunneling or undermining noted. There is [name] none present amount of drainage noted. There is no granulation within the wound bed. There is no necrotic tissue within the wound bed. Assessment Active Problems ICD-10 Non-pressure chronic ulcer of other part  of left lower leg with fat layer exposed Chronic venous hypertension (idiopathic) with ulcer of left lower extremity Lymphedema, not elsewhere classified Chronic venous hypertension (idiopathic) with inflammation of right lower extremity Type 2 diabetes mellitus with other skin ulcer Plan Kinney, Joden [name] ([id number]) [id number] Page 11 of 11 1. Based on what I am seeing I do believe that the patient would benefit here from [name] continuation of therapy with regard to the Tubigrip which she is using on both legs and seems to be doing [name] good job. 2. I am also can recommend that he should continue to elevate his legs much as possible to help with edema control. 3. I would also suggest that he continue to monitor for any signs of infection and if anything changes he knows to let me know. Will see him back for [name] follow-up visit in 3 weeks for reevaluation just to ensure everything is doing well and presuming everything is doing great he will be ready for full discharge at that point. Electronic Signature(s) Signed: 08/17/2023 8:35:35 AM By: Allen Derry PA-C Entered By: Allen Derry on 08/17/2023 05:35:35 -------------------------------------------------------------------------------- SuperBill Details Patient Name: Date of Service: [name], [name] 08/17/2023 Medical Record Number: [id number] Patient Account Number: [id number] Date of Birth/Sex: Treating RN: 04-29-1971 (52 y.o. 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 E11.622 Type 2 diabetes mellitus with other skin ulcer Facility Procedures : CPT4 Code: 16109604 Description: 54098 - WOUND CARE VISIT-LEV 2 EST PT Modifier: Quantity: 1 Physician Procedures : CPT4 Code Description Modifier [id number] - WC PHYS LEVEL 3 - EST PT ICD-10 Diagnosis Description L97.822 Non-pressure chronic ulcer of other part of left lower leg with fat layer exposed I87.312 Chronic venous hypertension (idiopathic) with ulcer  of left lower extremity I89.0 Lymphedema, not elsewhere classified I87.321 Chronic venous hypertension (idiopathic) with inflammation of right lower extremity Quantity: 1 Electronic Signature(s) Signed: 08/17/2023 8:39:58 AM By: [name] RN Signed: 08/19/2023 12:16:54 PM By: Allen Derry PA-C Previous Signature: 08/17/2023 8:35:59 AM Version By: Allen Derry PA-C Entered By: [name] on 08/17/2023 05:39:57

## 2023-08-18 DIAGNOSIS — N186 End stage renal disease: Secondary | ICD-10-CM | POA: Diagnosis not present

## 2023-08-18 DIAGNOSIS — Z992 Dependence on renal dialysis: Secondary | ICD-10-CM | POA: Diagnosis not present

## 2023-08-20 DIAGNOSIS — N186 End stage renal disease: Secondary | ICD-10-CM | POA: Diagnosis not present

## 2023-08-20 DIAGNOSIS — Z992 Dependence on renal dialysis: Secondary | ICD-10-CM | POA: Diagnosis not present

## 2023-08-20 NOTE — Progress Notes (Signed)
[name] ([id number]) [id number] Page 1 of 7 Visit Report for 08/17/2023 Arrival Information Details Patient Name: Date of Service: [name], [name] 08/17/2023 8:00 [name] Medical Record Number: [id number] Patient Account Number: [id number] Date of Birth/Sex: Treating RN: [birth year] (52 y.o. [name]) [name] Primary Care Naama Sappington: [name] Other Clinician: Referring Timmia Cogburn: Treating Guido Comp/Extender: [name] in Treatment: 26 Visit Information History Since Last Visit Added or deleted any medications: No Patient Arrived: Ambulatory Any new allergies or adverse reactions: No Arrival Time: 08:12 Had [name] fall or experienced change in No Accompanied By: self activities of daily living that may affect Transfer Assistance: None risk of falls: Patient Identification Verified: Yes Signs or symptoms of abuse/neglect since last visito No Secondary Verification Process Completed: Yes Hospitalized since last visit: No Patient Requires Transmission-Based Precautions: No Implantable device outside of the clinic excluding No Patient Has Alerts: Yes cellular tissue based products placed in the center Patient Alerts: Patient on Blood Thinner since last visit: ABI R [place] TBI .61 10/11/22 Has Dressing in Place as Prescribed: Yes ABI L [place] TBI .33 10/11/22 Has Compression in Place as Prescribed: Yes Pain Present Now: No Electronic Signature(s) Signed: 08/19/2023 11:21:11 AM By: [name] Pax RN Entered By: [name] on 08/17/2023 08:12:32 -------------------------------------------------------------------------------- Clinic Level of Care Assessment Details Patient Name: Date of Service: [name], [name] 08/17/2023 8:00 [name] Medical Record Number: [id number] Patient Account Number: [id number] Date of Birth/Sex: Treating RN: 09-Jun-1971 (52 y.o. [name] Primary Care Sheddrick Lattanzio: [name] Other Clinician: Referring  [name]: Treating [name]/Extender: [name] in Treatment: 26 Clinic Level of Care Assessment Items TOOL 4 Quantity Score X- 1 0 Use when only an EandM is performed on FOLLOW-UP visit ASSESSMENTS - Nursing Assessment / Reassessment X- 1 10 Reassessment of Co-morbidities (includes updates in patient status) X- 1 5 Reassessment of Adherence to Treatment Plan Whitworth, Julius [name] ([id number]) [id number] Page 2 of 7 ASSESSMENTS - Wound and Skin [name] ssessment / Reassessment X - Simple Wound Assessment / Reassessment - one wound 1 5 []  - 0 Complex Wound Assessment / Reassessment - multiple wounds []  - 0 Dermatologic / Skin Assessment (not related to wound area) ASSESSMENTS - Focused Assessment []  - 0 Circumferential Edema Measurements - multi extremities []  - 0 Nutritional Assessment / Counseling / Intervention []  - 0 Lower Extremity Assessment (monofilament, tuning fork, pulses) []  - 0 Peripheral Arterial Disease Assessment (using hand held doppler) ASSESSMENTS - Ostomy and/or Continence Assessment and Care []  - 0 Incontinence Assessment and Management []  - 0 Ostomy Care Assessment and Management (repouching, etc.) PROCESS - Coordination of Care X - Simple Patient / Family Education for ongoing care 1 15 []  - 0 Complex (extensive) Patient / Family Education for ongoing care []  - 0 Staff obtains Chiropractor, Records, T Results / Process Orders est []  - 0 Staff telephones HHA, Nursing Homes / Clarify orders / etc []  - 0 Routine Transfer to another Facility (non-emergent condition) []  - 0 Routine Hospital Admission (non-emergent condition) []  - 0 New Admissions / Manufacturing engineer / Ordering NPWT Apligraf, etc. , []  - 0 Emergency Hospital Admission (emergent condition) X- 1 10 Simple Discharge Coordination []  - 0 Complex (extensive) Discharge Coordination PROCESS - Special Needs []  - 0 Pediatric / Minor Patient  Management []  - 0 Isolation Patient Management []  - 0 Hearing / Language / Visual special needs []  - 0 Assessment of Community assistance (transportation, D/C planning,  etc.) []  - 0 Additional assistance / Altered mentation []  - 0 Support Surface(s) Assessment (bed, cushion, seat, etc.) INTERVENTIONS - Wound Cleansing / Measurement []  - 0 Simple Wound Cleansing - one wound []  - 0 Complex Wound Cleansing - multiple wounds []  - 0 Wound Imaging (photographs - any number of wounds) []  - 0 Wound Tracing (instead of photographs) []  - 0 Simple Wound Measurement - one wound []  - 0 Complex Wound Measurement - multiple wounds INTERVENTIONS - Wound Dressings []  - 0 Small Wound Dressing one or multiple wounds []  - 0 Medium Wound Dressing one or multiple wounds []  - 0 Large Wound Dressing one or multiple wounds []  - 0 Application of Medications - topical []  - 0 Application of Medications - injection INTERVENTIONS - Miscellaneous []  - 0 External ear exam [place], Leyton [name] ([id number]) [id number] Page 3 of 7 []  - 0 Specimen Collection (cultures, biopsies, blood, body fluids, etc.) []  - 0 Specimen(s) / Culture(s) sent or taken to Lab for analysis []  - 0 Patient Transfer (multiple staff / Michiel Sites Lift / Similar devices) []  - 0 Simple Staple / Suture removal (25 or less) []  - 0 Complex Staple / Suture removal (26 or more) []  - 0 Hypo / Hyperglycemic Management (close monitor of Blood Glucose) []  - 0 Ankle / Brachial Index (ABI) - do not check if billed separately X- 1 5 Vital Signs Has the patient been seen at the hospital within the last three years: Yes Total Score: 50 Level Of Care: New/Established - Level 2 Electronic Signature(s) Signed: 08/19/2023 11:21:11 AM By: [name] Pax RN Entered By: [name] on 08/17/2023 08:39:43 -------------------------------------------------------------------------------- Encounter Discharge Information  Details Patient Name: Date of Service: [name], [name] 08/17/2023 8:00 [name] Medical Record Number: [id number] Patient Account Number: [id number] Date of Birth/Sex: Treating RN: 29-Dec-1970 (52 y.o. [name] Primary Care Sederick Jacobsen: [name] Other Clinician: Referring Cartha Rotert: Treating Azan Maneri/Extender: [name] in Treatment: 26 Encounter Discharge Information Items Discharge Condition: Stable Ambulatory Status: Ambulatory Discharge Destination: Home Transportation: Private Auto Accompanied By: self Schedule Follow-up Appointment: Yes Clinical Summary of Care: Electronic Signature(s) Signed: 08/17/2023 8:41:27 AM By: [name] Pax RN Entered By: [name] on 08/17/2023 08:41:26 Lower Extremity Assessment Details -------------------------------------------------------------------------------- [name] ([id number]) [id number] Page 4 of 7 Patient Name: Date of Service: [name], [name] 08/17/2023 8:00 [name] Medical Record Number: [id number] Patient Account Number: [id number] Date of Birth/Sex: Treating RN: [birth year] (52 y.o. [name]) [name] Primary Care Anselm Aumiller: [name] Other Clinician: Referring Parveen Freehling: Treating Karriem Muench/Extender: [name] in Treatment: 26 Electronic Signature(s) Signed: 08/19/2023 11:21:11 AM By: [name] Pax RN Entered By: [name] on 08/17/2023 08:26:44 -------------------------------------------------------------------------------- Multi Wound Chart Details Patient Name: Date of Service: [name], [name] 08/17/2023 8:00 [name] Medical Record Number: [id number] Patient Account Number: [id number] Date of Birth/Sex: Treating RN: Aug 14, 1971 (52 y.o. [name] Primary Care Jonita Hirota: [name] Other Clinician: Referring Dennys Guin: Treating Kao Conry/Extender: [name] in Treatment: 26 Vital Signs Height(in):  76 Pulse(bpm): 86 Weight(lbs): 400 Blood Pressure(mmHg): 155/93 Body Mass Index(BMI): 48.7 Temperature(F): 97.9 Respiratory Rate(breaths/min): 18 [22:Photos: No Photos Left Lower Leg Wound Location: Gradually Appeared Wounding Event: Diabetic Wound/Ulcer of the Lower Primary Etiology: Extremity Hypertension, Type II Diabetes, Comorbid History: Neuropathy 08/30/2022 Date Acquired: 26 Weeks of Treatment: Healed -  Epithelialized Wound Status: No Wound Recurrence: 0x0x0 Measurements L x W x D (cm) 0 [name] (cm) :  rea 0 Volume (cm) : 100.00% % Reduction in [name] rea: 100.00% % Reduction in Volume: Grade 2 Classification: None Present Exudate [name] mount: None Present (0%)  Granulation [name] mount: None Present (0%) Necrotic [name] mount: Fascia: No Exposed Structures: Fat Layer (Subcutaneous Tissue): No Tendon: No Muscle: No Joint: No Bone: No Large (67-100%) Epithelialization:] [N/[name]:N/[name] N/[name] N/[name] N/[name] N/[name] N/[name] N/[name] N/[name] N/[name] N/[name] N/[name] N/[name]  N/[name] N/[name] N/[name] N/[name] N/[name] N/[name] N/[name] N/[name]] Treatment Notes Electronic Signature(s) Signed: 08/19/2023 11:21:11 AM By: [name] Pax RN [place], Zimri [name] ([id number]) [id number] Page 5 of 7 Entered By: [name] on 08/17/2023 08:26:53 -------------------------------------------------------------------------------- Pain Assessment Details Patient Name: Date of Service: [name], [name] 08/17/2023 8:00 [name] Medical Record Number: [id number] Patient Account Number: [id number] Date of Birth/Sex: Treating RN: Jul 25, 1971 (52 y.o. [name]) [name] Primary Care Ada Holness: [name] Other Clinician: Referring Koki Buxton: Treating Clarence Cogswell/Extender: [name] in Treatment: 26 Active Problems Location of Pain Severity and Description of Pain Patient Has Paino No Site Locations Pain Management and Medication Current Pain Management: Electronic Signature(s) Signed: 08/19/2023 11:21:11 AM By: [name] Pax RN Entered By: [name] on 08/17/2023  08:13:06 -------------------------------------------------------------------------------- Patient/Caregiver Education Details Patient Name: Date of Service: [name], [name] 12/18/2024andnbsp8:00 [name] Medical Record Number: [id number] Patient Account Number: [id number] Date of Birth/Gender: Treating RN: 1970-10-20 (52 y.o. [name] Primary Care Physician: [name] Other Clinician: TEX, HEIMER ([id number]) [id number] Page 6 of 7 Referring Physician: Treating Physician/Extender: [name] in Treatment: 26 Education Assessment Education Provided To: Patient Education Topics Provided Wound/Skin Impairment: Handouts: Other: lymphedema pumps Methods: Explain/Verbal Responses: State content correctly Electronic Signature(s) Signed: 08/19/2023 11:21:11 AM By: [name] Pax RN Entered By: [name] on 08/17/2023 08:40:40 -------------------------------------------------------------------------------- Wound Assessment Details Patient Name: Date of Service: [name], [name] 08/17/2023 8:00 [name] Medical Record Number: [id number] Patient Account Number: [id number] Date of Birth/Sex: Treating RN: March 18, 1971 (52 y.o. [name]) [name] Primary Care Bradley Handyside: [name] Other Clinician: Referring Siena Poehler: Treating Rickeya Manus/Extender: [name] in Treatment: 26 Wound Status Wound Number: 22 Primary Etiology: Diabetic Wound/Ulcer of the Lower Extremity Wound Location: Left Lower Leg Wound Status: Healed - Epithelialized Wounding Event: Gradually Appeared Comorbid History: Hypertension, Type II Diabetes, Neuropathy Date Acquired: 08/30/2022 Weeks Of Treatment: 26 Clustered Wound: No Wound Measurements Length: (cm) Width: (cm) Depth: (cm) Area: (cm) Volume: (cm) 0 % Reduction in Area: 100% 0 % Reduction in Volume: 100% 0 Epithelialization: Large (67-100%) 0 Tunneling: No 0 Undermining:  No Wound Description Classification: Grade 2 Exudate Amount: None Present Foul Odor After Cleansing: No Slough/Fibrino No Wound Bed Granulation Amount: None Present (0%) Exposed Structure Necrotic Amount: None Present (0%) Fascia Exposed: No Fat Layer (Subcutaneous Tissue) Exposed: No Tendon Exposed: No Muscle Exposed: No Joint Exposed: No Bone Exposed: No Hari, Kengo [name] ([id number]) [id number] Page 7 of 7 Treatment Notes Wound #22 (Lower Leg) Wound Laterality: Left Cleanser Peri-Wound Care Topical Primary Dressing Secondary Dressing Secured With Compression Wrap Compression Stockings Add-Ons Electronic Signature(s) Signed: 08/19/2023 11:21:11 AM By: [name] Pax RN Entered By: [name] on 08/17/2023 08:26:05 -------------------------------------------------------------------------------- Vitals Details Patient Name: Date of Service: [name], [name] 08/17/2023 8:00 [name] Medical Record Number: [id number] Patient Account Number: [id number] Date of Birth/Sex: Treating RN: [birth year] (52 y.o. [name] Primary Care Cheyene Hamric: [name] Other Clinician: Referring Maui Ahart: Treating Ruxin Ransome/Extender: [name] in Treatment: 26  Vital Signs Time Taken: 08:12 Temperature (F): 97.9 Height (in): 76 Pulse (bpm): 86 Weight (lbs): 400 Respiratory Rate (breaths/min): 18 Body Mass Index (BMI): 48.7 Blood Pressure (mmHg): 155/93 Reference Range: 80 - 120 mg / dl Electronic Signature(s) Signed: 08/19/2023 11:21:11 AM By: [name] Pax RN Entered By: [name] on 08/17/2023 08:12:56

## 2023-08-23 DIAGNOSIS — N186 End stage renal disease: Secondary | ICD-10-CM | POA: Diagnosis not present

## 2023-08-23 DIAGNOSIS — Z992 Dependence on renal dialysis: Secondary | ICD-10-CM | POA: Diagnosis not present

## 2023-08-25 DIAGNOSIS — Z992 Dependence on renal dialysis: Secondary | ICD-10-CM | POA: Diagnosis not present

## 2023-08-25 DIAGNOSIS — N186 End stage renal disease: Secondary | ICD-10-CM | POA: Diagnosis not present

## 2023-08-27 DIAGNOSIS — N186 End stage renal disease: Secondary | ICD-10-CM | POA: Diagnosis not present

## 2023-08-27 DIAGNOSIS — Z992 Dependence on renal dialysis: Secondary | ICD-10-CM | POA: Diagnosis not present

## 2023-08-29 DIAGNOSIS — N186 End stage renal disease: Secondary | ICD-10-CM | POA: Diagnosis not present

## 2023-08-29 DIAGNOSIS — E11622 Type 2 diabetes mellitus with other skin ulcer: Secondary | ICD-10-CM | POA: Diagnosis not present

## 2023-08-29 DIAGNOSIS — E1169 Type 2 diabetes mellitus with other specified complication: Secondary | ICD-10-CM | POA: Diagnosis not present

## 2023-08-29 DIAGNOSIS — E113599 Type 2 diabetes mellitus with proliferative diabetic retinopathy without macular edema, unspecified eye: Secondary | ICD-10-CM | POA: Diagnosis not present

## 2023-08-29 DIAGNOSIS — E1165 Type 2 diabetes mellitus with hyperglycemia: Secondary | ICD-10-CM | POA: Diagnosis not present

## 2023-08-29 DIAGNOSIS — Z794 Long term (current) use of insulin: Secondary | ICD-10-CM | POA: Diagnosis not present

## 2023-08-29 DIAGNOSIS — E221 Hyperprolactinemia: Secondary | ICD-10-CM | POA: Diagnosis not present

## 2023-08-29 DIAGNOSIS — Z992 Dependence on renal dialysis: Secondary | ICD-10-CM | POA: Diagnosis not present

## 2023-08-29 DIAGNOSIS — E1159 Type 2 diabetes mellitus with other circulatory complications: Secondary | ICD-10-CM | POA: Diagnosis not present

## 2023-08-29 DIAGNOSIS — E1122 Type 2 diabetes mellitus with diabetic chronic kidney disease: Secondary | ICD-10-CM | POA: Diagnosis not present

## 2023-08-30 DIAGNOSIS — N186 End stage renal disease: Secondary | ICD-10-CM | POA: Diagnosis not present

## 2023-08-30 DIAGNOSIS — Z992 Dependence on renal dialysis: Secondary | ICD-10-CM | POA: Diagnosis not present

## 2023-09-01 ENCOUNTER — Telehealth: Payer: Self-pay | Admitting: *Deleted

## 2023-09-01 DIAGNOSIS — E8779 Other fluid overload: Secondary | ICD-10-CM | POA: Diagnosis not present

## 2023-09-01 DIAGNOSIS — N186 End stage renal disease: Secondary | ICD-10-CM | POA: Diagnosis not present

## 2023-09-01 DIAGNOSIS — Z992 Dependence on renal dialysis: Secondary | ICD-10-CM | POA: Diagnosis not present

## 2023-09-01 NOTE — Patient Outreach (Signed)
  Care Coordination   Follow Up Visit Note   09/01/2023 Name: ADEOLUWA SILVERS MRN: [id number] DOB: [birth year]  Jobie A Maalouf is a 53 y.o. year old male who sees [name], Cheryl BRAVO, MD for primary care. I spoke with  Tad DELENA Baptist by phone today.  What matters to the patients health and wellness today?  Report he is happy with his leg wound healing, continues to work on better DM control.     Goals Addressed             This Visit's Progress    Effective management of DM   On track    Care Coordination Interventions: Provided education to patient about basic DM disease process Reviewed medications with patient and discussed importance of medication adherence Counseled on importance of regular laboratory monitoring as prescribed Advised patient, providing education and rationale, to check cbg daily and record, calling provider for findings outside established parameters Report he is adherent to HD on Tuesdays, Thursdays, and Saturdays       Effective wound healing of leg wound   On track    Care Coordination Interventions: Evaluation of current treatment plan related to leg wound and patient's adherence to plan as established by provider Advised patient to call [facility] agency to discuss concerns for treatment (regarding availability of supplies) Reviewed scheduled/upcoming provider appointments including HH wound care nursing visit on Mondays and Fridays, wound care office visits every Wednesday Discussed plans with patient for ongoing care management follow up and provided patient with direct contact information for care management team         SDOH assessments and interventions completed:  No     Care Coordination Interventions:  Yes, provided   Interventions Today    Flowsheet Row Most Recent Value  Chronic Disease   Chronic disease during today's visit Hypertension (HTN), Congestive Heart Failure (CHF), Diabetes  General Interventions   General Interventions  Discussed/Reviewed General Interventions Reviewed, Labs, Durable Medical Equipment (DME), Doctor Visits  [report leg wounds healing, no longer needing HH for dressing changes]  Labs Hgb A1c every 3 months  [Most recent increased to 7.9]  Doctor Visits Discussed/Reviewed Doctor Visits Reviewed, PCP, Specialist  [Wound care 1/6, Vascular 1/13, podiatry 2/7]  Durable Medical Equipment (DME) Glucomoter  marveen dexcom]  PCP/Specialist Visits Compliance with follow-up visit  Exercise Interventions   Exercise Discussed/Reviewed Weight Managment  Weight Management Weight maintenance  Education Interventions   Education Provided Provided Education  Provided Verbal Education On Nutrition, Blood Sugar Monitoring, Medication, When to see the doctor, Labs  [recently placed on insulin , humalog 5units with meals and Lantus  daily]  Labs Reviewed Hgb A1c  [aware of goal A1C less than 7]  Nutrition Interventions   Nutrition Discussed/Reviewed Nutrition Reviewed, Adding fruits and vegetables, Decreasing sugar intake, Carbohydrate meal planning       Follow up plan: Follow up call scheduled for 2/10    Encounter Outcome:  Patient Visit Completed   Odella Ku, RN, MSN, CCM [place]  [facility], [facility] RN Care Coordinator Direct Dial : [phone] / Main [phone] Fax [phone] Email: [email or web address]: [email or web address]

## 2023-09-02 ENCOUNTER — Encounter: Payer: Self-pay | Admitting: *Deleted

## 2023-09-03 DIAGNOSIS — N186 End stage renal disease: Secondary | ICD-10-CM | POA: Diagnosis not present

## 2023-09-03 DIAGNOSIS — E8779 Other fluid overload: Secondary | ICD-10-CM | POA: Diagnosis not present

## 2023-09-03 DIAGNOSIS — Z992 Dependence on renal dialysis: Secondary | ICD-10-CM | POA: Diagnosis not present

## 2023-09-05 ENCOUNTER — Ambulatory Visit: Payer: Medicare HMO | Admitting: Physician Assistant

## 2023-09-06 DIAGNOSIS — Z992 Dependence on renal dialysis: Secondary | ICD-10-CM | POA: Diagnosis not present

## 2023-09-06 DIAGNOSIS — E8779 Other fluid overload: Secondary | ICD-10-CM | POA: Diagnosis not present

## 2023-09-06 DIAGNOSIS — N186 End stage renal disease: Secondary | ICD-10-CM | POA: Diagnosis not present

## 2023-09-08 DIAGNOSIS — Z992 Dependence on renal dialysis: Secondary | ICD-10-CM | POA: Diagnosis not present

## 2023-09-08 DIAGNOSIS — E8779 Other fluid overload: Secondary | ICD-10-CM | POA: Diagnosis not present

## 2023-09-08 DIAGNOSIS — N186 End stage renal disease: Secondary | ICD-10-CM | POA: Diagnosis not present

## 2023-09-09 DIAGNOSIS — G4730 Sleep apnea, unspecified: Secondary | ICD-10-CM | POA: Diagnosis not present

## 2023-09-09 DIAGNOSIS — M1030 Gout due to renal impairment, unspecified site: Secondary | ICD-10-CM | POA: Diagnosis not present

## 2023-09-09 DIAGNOSIS — I871 Compression of vein: Secondary | ICD-10-CM | POA: Diagnosis not present

## 2023-09-09 DIAGNOSIS — T82858A Stenosis of vascular prosthetic devices, implants and grafts, initial encounter: Secondary | ICD-10-CM | POA: Diagnosis not present

## 2023-09-09 DIAGNOSIS — N186 End stage renal disease: Secondary | ICD-10-CM | POA: Diagnosis not present

## 2023-09-09 DIAGNOSIS — Z992 Dependence on renal dialysis: Secondary | ICD-10-CM | POA: Diagnosis not present

## 2023-09-09 DIAGNOSIS — I120 Hypertensive chronic kidney disease with stage 5 chronic kidney disease or end stage renal disease: Secondary | ICD-10-CM | POA: Diagnosis not present

## 2023-09-09 DIAGNOSIS — E1122 Type 2 diabetes mellitus with diabetic chronic kidney disease: Secondary | ICD-10-CM | POA: Diagnosis not present

## 2023-09-09 DIAGNOSIS — T82898A Other specified complication of vascular prosthetic devices, implants and grafts, initial encounter: Secondary | ICD-10-CM | POA: Diagnosis not present

## 2023-09-10 DIAGNOSIS — N186 End stage renal disease: Secondary | ICD-10-CM | POA: Diagnosis not present

## 2023-09-10 DIAGNOSIS — E8779 Other fluid overload: Secondary | ICD-10-CM | POA: Diagnosis not present

## 2023-09-10 DIAGNOSIS — Z992 Dependence on renal dialysis: Secondary | ICD-10-CM | POA: Diagnosis not present

## 2023-09-10 NOTE — H&P (View-Only) (Signed)
MRN : [id number]  [name] is a 53 y.o. (03-18-1971) male who presents with chief complaint of check access.  History of Present Illness:   The patient returns to the office for followup of their dialysis access.   The patient reports the function of the access has been variable. He notes he was just in [place] this past Friday and was told everything was fine.  Patient denies difficulty with cannulation. The patient denies increased bleeding time after removing the needles. The patient denies hand pain or other symptoms consistent with steal phenomena.  No significant arm swelling.  The patient denies any complaints from the dialysis center or their nephrologist.  The patient denies redness or swelling at the access site. The patient denies fever or chills at home or while on dialysis.  No recent shortening of the patient's walking distance or new symptoms consistent with claudication.  No history of rest pain symptoms. No new ulcers or wounds of the lower extremities have occurred.  The patient denies amaurosis fugax or recent TIA symptoms. There are no recent neurological changes noted. There is no history of DVT, PE or superficial thrombophlebitis. No recent episodes of angina or shortness of breath documented.   Duplex ultrasound of the AV access shows a patent access.  The previously noted stenosis is not significantly changed compared to last study.  Flow volume today is 275 cc/min (previous flow volume was 847 cc/min)    No outpatient medications have been marked as taking for the 09/12/23 encounter (Appointment) with Gilda Crease, Latina Craver, MD.    Past Medical History:  Diagnosis Date   Anemia    Chronic kidney disease    14% FUNCTION   Diabetes mellitus without complication (HCC)    Diabetic retinopathy (HCC)    Dyspnea    DOE   Dysrhythmia    End stage renal disease (HCC)    History of orthopnea    error wrong patietn   Hypercholesteremia    error    Hyperkalemia    error wrong patient   Hypertension    Long-term insulin use (HCC)    Lymphedema of leg    Metabolic encephalopathy    error   Microalbuminuria    MSSA (methicillin susceptible Staphylococcus aureus)    Obesity    PVD (peripheral vascular disease) (HCC)    Respiratory failure (HCC)    Error   Sepsis (HCC)    error   Sleep apnea    no CPAP   UTI (urinary tract infection)    error   Venous ulcer (HCC)    Vitamin D deficiency     Past Surgical History:  Procedure Laterality Date   A/V FISTULAGRAM Right 10/24/2018   Procedure: A/V FISTULAGRAM;  Surgeon: Renford Dills, MD;  Location: [facility];  Service: Cardiovascular;  Laterality: Right;   A/V FISTULAGRAM Right 11/24/2018   Procedure: A/V FISTULAGRAM;  Surgeon: Renford Dills, MD;  Location: [facility];  Service: Cardiovascular;  Laterality: Right;   A/V FISTULAGRAM Left 07/31/2019   Procedure: A/V FISTULAGRAM;  Surgeon: Renford Dills, MD;  Location: [facility];  Service: Cardiovascular;  Laterality: Left;   A/V FISTULAGRAM Left 12/11/2019   Procedure: A/V FISTULAGRAM;  Surgeon: Renford Dills, MD;  Location: [facility];  Service: Cardiovascular;  Laterality: Left;   A/V FISTULAGRAM Left 07/08/2023   Procedure: A/V Fistulagram;  Surgeon: Renford Dills, MD;  Location:  [facility];  Service: Cardiovascular;  Laterality: Left;   A/V SHUNT INTERVENTION Right 02/21/2019   Procedure: A/V SHUNT INTERVENTION;  Surgeon: Renford Dills, MD;  Location: [facility];  Service: Cardiovascular;  Laterality: Right;   APPLICATION OF WOUND VAC Left 10/03/2017   Procedure: APPLICATION OF WOUND VAC;  Surgeon: Annice Needy, MD;  Location: [facility];  Service: General;  Laterality: Left;   APPLICATION OF WOUND VAC Left 10/11/2017   Procedure: APPLICATION OF WOUND VAC;  Surgeon: Renford Dills, MD;  Location: [facility];  Service: Vascular;  Laterality:  Left;   APPLICATION OF WOUND VAC Left 10/14/2017   Procedure: WOUND VAC CHANGE;  Surgeon: Renford Dills, MD;  Location: [facility];  Service: Vascular;  Laterality: Left;  left lower leg   APPLICATION OF WOUND VAC Left 10/18/2017   Procedure: WOUND VAC CHANGE;  Surgeon: Renford Dills, MD;  Location: [facility];  Service: Vascular;  Laterality: Left;   APPLICATION OF WOUND VAC Left 10/07/2017   Procedure: APPLICATION OF WOUND VAC;  Surgeon: Renford Dills, MD;  Location: [facility];  Service: Vascular;  Laterality: Left;   APPLICATION OF WOUND VAC Left 11/17/2022   Procedure: Wound vac exchange;  Surgeon: Renford Dills, MD;  Location: [facility];  Service: Vascular;  Laterality: Left;   APPLICATION OF WOUND VAC Left 11/10/2022   Procedure: APPLICATION OF WOUND VAC;  Surgeon: Renford Dills, MD;  Location: [facility];  Service: Vascular;  Laterality: Left;   APPLICATION OF WOUND VAC Left 11/24/2022   Procedure: WOUND VAC REMOVAL;  Surgeon: Renford Dills, MD;  Location: [facility];  Service: Vascular;  Laterality: Left;   APPLICATION OF WOUND VAC Left 11/12/2022   Procedure: APPLICATION OF WOUND VAC;  Surgeon: Renford Dills, MD;  Location: [facility];  Service: Vascular;  Laterality: Left;   APPLICATION OF WOUND VAC Left 01/06/2023   Procedure: APPLICATION OF WOUND VAC;  Surgeon: Renford Dills, MD;  Location: [facility];  Service: Vascular;  Laterality: Left;   AV FISTULA INSERTION W/ RF MAGNETIC GUIDANCE Right 07/19/2018   Procedure: AV FISTULA INSERTION W/RF MAGNETIC GUIDANCE;  Surgeon: Renford Dills, MD;  Location: [facility];  Service: Cardiovascular;  Laterality: Right;   AV FISTULA PLACEMENT Left 05/11/2019   Procedure: ARTERIOVENOUS (AV) FISTULA CREATION (RADIOCEPHALIC );  Surgeon: Renford Dills, MD;  Location: [facility];  Service: Vascular;  Laterality: Left;   AV FISTULA PLACEMENT Left 02/20/2020   Procedure: INSERTION OF ARTERIOVENOUS (AV) GORE-TEX GRAFT LEFT  ARM ( FOREARM LOOP );  Surgeon: Renford Dills, MD;  Location: [facility];  Service: Vascular;  Laterality: Left;   CATARACT EXTRACTION W/PHACO Left 11/01/2018   Procedure: CATARACT EXTRACTION PHACO AND INTRAOCULAR LENS PLACEMENT (IOC)-LEFT, DIABETIC-INSULIN DEPENDENT;  Surgeon: Nevada Crane, MD;  Location: [facility];  Service: Ophthalmology;  Laterality: Left;  [place] 00:41.8 CDE 4.61 Fluid Pack Lot # [id number]   CATARACT EXTRACTION W/PHACO Right 04/27/2019   Procedure: CATARACT EXTRACTION PHACO AND INTRAOCULAR LENS PLACEMENT (IOC);  Surgeon: Nevada Crane, MD;  Location: [facility];  Service: Ophthalmology;  Laterality: Right;  [place] 00:34 CDE 1.97 FLUID PACK LOT # [id number]    COLONOSCOPY WITH PROPOFOL N/A 05/12/2020   Procedure: COLONOSCOPY WITH PROPOFOL;  Surgeon: [place], Boykin Nearing, MD;  Location: [facility];  Service: Gastroenterology;  Laterality: N/A;   DIALYSIS/PERMA CATHETER INSERTION N/A 02/23/2019   Procedure: DIALYSIS/PERMA CATHETER INSERTION;  Surgeon: Annice Needy, MD;  Location: [facility];  Service: Cardiovascular;  Laterality: N/A;   DIALYSIS/PERMA CATHETER REMOVAL N/A 04/03/2020   Procedure: DIALYSIS/PERMA CATHETER REMOVAL;  Surgeon: Renford Dills, MD;  Location: [facility];  Service: Cardiovascular;  Laterality: N/A;   DIALYSIS/PERMA CATHETER REMOVAL N/A 02/03/2022   Procedure: DIALYSIS/PERMA CATHETER REMOVAL;  Surgeon: Renford Dills, MD;  Location: [facility];  Service: Cardiovascular;  Laterality: N/A;   I & D EXTREMITY Left 10/11/2017   Procedure: IRRIGATION AND DEBRIDEMENT EXTREMITY;  Surgeon: Renford Dills, MD;  Location: [facility];  Service: Vascular;  Laterality: Left;   INCISION AND DRAINAGE ABSCESS Right 10/07/2017   Procedure: INCISION AND DRAINAGE ABSCESS;  Surgeon: Renford Dills, MD;  Location: [facility];  Service: Vascular;  Laterality: Right;   INCISION AND DRAINAGE OF WOUND Left 11/17/2022   Procedure: DEBRIDEMENT  WOUND;  Surgeon: Renford Dills, MD;  Location: [facility];  Service: Vascular;  Laterality: Left;   INCISION AND DRAINAGE OF WOUND Left 11/24/2022   Procedure: DEBRIDEMENT WOUND LEFT LOWER EXTREMITY;  Surgeon: Renford Dills, MD;  Location: [facility];  Service: Vascular;  Laterality: Left;   IRRIGATION AND DEBRIDEMENT ABSCESS Left 10/03/2017   Procedure: IRRIGATION AND DEBRIDEMENT ABSCESS with debridement of skin, soft tissue, muscle 50sq cm;  Surgeon: Annice Needy, MD;  Location: [facility];  Service: General;  Laterality: Left;   LOWER EXTREMITY ANGIOGRAPHY Left 10/15/2022   Procedure: Lower Extremity Angiography;  Surgeon: Renford Dills, MD;  Location: [facility];  Service: Cardiovascular;  Laterality: Left;   TEE WITHOUT CARDIOVERSION N/A 03/29/2023   Procedure: TRANSESOPHAGEAL ECHOCARDIOGRAM;  Surgeon: Antonieta Iba, MD;  Location: [facility];  Service: Cardiovascular;  Laterality: N/A;   TEMPORARY DIALYSIS CATHETER  02/20/2019   Procedure: TEMPORARY DIALYSIS CATHETER;  Surgeon: Renford Dills, MD;  Location: [facility];  Service: Cardiovascular;;   UPPER EXTREMITY ANGIOGRAPHY Left 09/18/2019   Procedure: UPPER EXTREMITY ANGIOGRAPHY;  Surgeon: Renford Dills, MD;  Location: [facility];  Service: Cardiovascular;  Laterality: Left;   WOUND DEBRIDEMENT Left 11/10/2022   Procedure: DEBRIDEMENT WOUND;  Surgeon: Renford Dills, MD;  Location: [facility];  Service: Vascular;  Laterality: Left;   WOUND DEBRIDEMENT Left 11/12/2022   Procedure: DEBRIDEMENT WOUND;  Surgeon: Renford Dills, MD;  Location: [facility];  Service: Vascular;  Laterality: Left;   WOUND DEBRIDEMENT Left 01/06/2023   Procedure: DEBRIDEMENT WOUND;  Surgeon: Renford Dills, MD;  Location: [facility];  Service: Vascular;  Laterality: Left;    Social History Social History   Tobacco Use   Smoking status: Former    Types: Cigars    Passive exposure: Past   Smokeless tobacco: Former     Quit date: 09/30/2017   Tobacco comments:    occasional  Vaping Use   Vaping status: Never Used  Substance Use Topics   Alcohol use: Yes    Alcohol/week: 1.0 standard drink of alcohol    Types: 1 Cans of beer per week    Comment: beer   Drug use: Not Currently    Family History Family History  Problem Relation Age of Onset   Diabetes Father    Kidney disease Father    Diabetes Daughter     Allergies  Allergen Reactions   Lisinopril Swelling    Lips mouth    Furosemide Cough    Light headed, blurry vision, weakness.   Latex Itching   Silicone Itching  Adhesive [Tape] Itching and Rash     REVIEW OF SYSTEMS (Negative unless checked)  Constitutional: [] Weight loss  [] Fever  [] Chills Cardiac: [] Chest pain   [] Chest pressure   [] Palpitations   [] Shortness of breath when laying flat   [] Shortness of breath with exertion. Vascular:  [] Pain in legs with walking   [] Pain in legs at rest  [] History of DVT   [] Phlebitis   [x] Swelling in legs   [] Varicose veins   [] Non-healing ulcers Pulmonary:   [] Uses home oxygen   [] Productive cough   [] Hemoptysis   [] Wheeze  [x] COPD   [] Asthma Neurologic:  [] Dizziness   [] Seizures   [] History of stroke   [] History of TIA  [] Aphasia   [] Vissual changes   [] Weakness or numbness in arm   [x] Weakness or numbness in leg Musculoskeletal:   [] Joint swelling   [x] Joint pain   [] Low back pain Hematologic:  [] Easy bruising  [] Easy bleeding   [] Hypercoagulable state   [] Anemic Gastrointestinal:  [] Diarrhea   [] Vomiting  [] Gastroesophageal reflux/heartburn   [] Difficulty swallowing. Genitourinary:  [x] Chronic kidney disease   [] Difficult urination  [] Frequent urination   [] Blood in urine Skin:  [] Rashes   [] Ulcers  Psychological:  [] History of anxiety   []  History of major depression.  Physical Examination  There were no vitals filed for this visit. There is no height or weight on file to calculate BMI. Gen: WD/WN, NAD Head: [place]/AT, No temporalis  wasting.  Ear/Nose/Throat: Hearing grossly intact, nares w/o erythema or drainage Eyes: PER, EOMI, sclera nonicteric.  Neck: Supple, no gross masses or lesions.  No JVD.  Pulmonary:  Good air movement, no audible wheezing, no use of accessory muscles.  Cardiac: RRR, precordium non-hyperdynamic. Vascular:   weak thrill and weak bruit.   Moderate venous stasis changes to the legs bilaterally.  3+ soft pitting edema. CEAP [id number]  Vessel Right Left  Radial Palpable Palpable  Brachial Palpable Palpable  Gastrointestinal: soft, non-distended. No guarding/no peritoneal signs.  Musculoskeletal: M/S 5/5 throughout.  No deformity.  Neurologic: CN 2-12 intact. Pain and light touch intact in extremities.  Symmetrical.  Speech is fluent. Motor exam as listed above. Psychiatric: Judgment intact, Mood & affect appropriate for pt's clinical situation. Dermatologic: No rashes or ulcers noted.  No changes consistent with cellulitis.   CBC Lab Results  Component Value Date   WBC 5.7 03/29/2023   HGB 10.9 (L) 03/29/2023   HCT 34.0 (L) 03/29/2023   MCV 93.2 03/29/2023   PLT 151 03/29/2023    BMET    Component Value Date/Time   NA 138 03/29/2023 0620   K 4.1 03/29/2023 0620   CL 99 03/29/2023 0620   CO2 24 03/29/2023 0620   GLUCOSE 124 (H) 03/29/2023 0620   BUN 82 (H) 03/29/2023 0620   CREATININE 13.30 (H) 03/29/2023 0620   CALCIUM 9.5 03/29/2023 0620   GFRNONAA 4 (L) 03/29/2023 0620   GFRAA 9 (L) 02/19/2020 1316   CrCl cannot be calculated (Patient's most recent lab result is older than the maximum 21 days allowed.).  COAG Lab Results  Component Value Date   INR 1.0 03/24/2023   INR 1.1 11/12/2022   INR 1.0 11/10/2022    Radiology No results found.   Assessment/Plan 1. End stage renal disease (HCC) (Primary) Recommend:  The patient is experiencing increasing problems with their dialysis access.  Patient should have a fistulagram of the left forearm loop graft with the  intention for intervention.  The intention for intervention is to restore  appropriate flow and prevent thrombosis and possible loss of the access.  As well as improve the quality of dialysis therapy.  The risks, benefits and alternative therapies were reviewed in detail with the patient.  All questions were answered.  The patient agrees to proceed with angio/intervention.    The patient will follow up with me in the office after the procedure.   2. Chronic venous insufficiency No surgery or intervention at this point in time.     I have had a long discussion with the patient regarding venous insufficiency and why it  causes symptoms, specifically venous ulceration. I have discussed with the patient the chronic skin changes that accompany venous insufficiency and the long term sequela such as infection and recurring  ulceration.  Patient will continue [employer] which he has been getting at the wound care center.  His Unna boot is being changed weekly drainage permitting.   In addition, behavioral modification including several periods of elevation of the lower extremities during the day will be continued. Achieving a position with the ankles at heart level was stressed to the patient   The patient is instructed to begin routine exercise, especially walking on a daily basis   In the future the patient can be assessed for graduated compression stockings or wraps as well as a Lymph Pump once the ulcers are healed.  3. Resistant hypertension Continue antihypertensive medications as already ordered, these medications have been reviewed and there are no changes at this time.  4. Chronic obstructive pulmonary disease, unspecified COPD type (HCC) Continue pulmonary medications and aerosols as already ordered, these medications have been reviewed and there are no changes at this time.   5. Type 2 diabetes mellitus with hyperglycemia, with long-term current use of insulin (HCC) Continue hypoglycemic  medications as already ordered, these medications have been reviewed and there are no changes at this time.  Hgb A1C to be monitored as already arranged by primary service    [name], MD  09/10/2023 12:45 PM

## 2023-09-10 NOTE — Progress Notes (Signed)
 MRN : [id number]  [name] is a 53 y.o. (03-18-1971) male who presents with chief complaint of check access.  History of Present Illness:   The patient returns to the office for followup of their dialysis access.   The patient reports the function of the access has been variable. He notes he was just in [place] this past Friday and was told everything was fine.  Patient denies difficulty with cannulation. The patient denies increased bleeding time after removing the needles. The patient denies hand pain or other symptoms consistent with steal phenomena.  No significant arm swelling.  The patient denies any complaints from the dialysis center or their nephrologist.  The patient denies redness or swelling at the access site. The patient denies fever or chills at home or while on dialysis.  No recent shortening of the patient's walking distance or new symptoms consistent with claudication.  No history of rest pain symptoms. No new ulcers or wounds of the lower extremities have occurred.  The patient denies amaurosis fugax or recent TIA symptoms. There are no recent neurological changes noted. There is no history of DVT, PE or superficial thrombophlebitis. No recent episodes of angina or shortness of breath documented.   Duplex ultrasound of the AV access shows a patent access.  The previously noted stenosis is not significantly changed compared to last study.  Flow volume today is 275 cc/min (previous flow volume was 847 cc/min)    No outpatient medications have been marked as taking for the 09/12/23 encounter (Appointment) with Gilda Crease, Latina Craver, MD.    Past Medical History:  Diagnosis Date   Anemia    Chronic kidney disease    14% FUNCTION   Diabetes mellitus without complication (HCC)    Diabetic retinopathy (HCC)    Dyspnea    DOE   Dysrhythmia    End stage renal disease (HCC)    History of orthopnea    error wrong patietn   Hypercholesteremia    error    Hyperkalemia    error wrong patient   Hypertension    Long-term insulin use (HCC)    Lymphedema of leg    Metabolic encephalopathy    error   Microalbuminuria    MSSA (methicillin susceptible Staphylococcus aureus)    Obesity    PVD (peripheral vascular disease) (HCC)    Respiratory failure (HCC)    Error   Sepsis (HCC)    error   Sleep apnea    no CPAP   UTI (urinary tract infection)    error   Venous ulcer (HCC)    Vitamin D deficiency     Past Surgical History:  Procedure Laterality Date   A/V FISTULAGRAM Right 10/24/2018   Procedure: A/V FISTULAGRAM;  Surgeon: Renford Dills, MD;  Location: [facility];  Service: Cardiovascular;  Laterality: Right;   A/V FISTULAGRAM Right 11/24/2018   Procedure: A/V FISTULAGRAM;  Surgeon: Renford Dills, MD;  Location: [facility];  Service: Cardiovascular;  Laterality: Right;   A/V FISTULAGRAM Left 07/31/2019   Procedure: A/V FISTULAGRAM;  Surgeon: Renford Dills, MD;  Location: [facility];  Service: Cardiovascular;  Laterality: Left;   A/V FISTULAGRAM Left 12/11/2019   Procedure: A/V FISTULAGRAM;  Surgeon: Renford Dills, MD;  Location: [facility];  Service: Cardiovascular;  Laterality: Left;   A/V FISTULAGRAM Left 07/08/2023   Procedure: A/V Fistulagram;  Surgeon: Renford Dills, MD;  Location:  [facility];  Service: Cardiovascular;  Laterality: Left;   A/V SHUNT INTERVENTION Right 02/21/2019   Procedure: A/V SHUNT INTERVENTION;  Surgeon: Renford Dills, MD;  Location: [facility];  Service: Cardiovascular;  Laterality: Right;   APPLICATION OF WOUND VAC Left 10/03/2017   Procedure: APPLICATION OF WOUND VAC;  Surgeon: Annice Needy, MD;  Location: [facility];  Service: General;  Laterality: Left;   APPLICATION OF WOUND VAC Left 10/11/2017   Procedure: APPLICATION OF WOUND VAC;  Surgeon: Renford Dills, MD;  Location: [facility];  Service: Vascular;  Laterality:  Left;   APPLICATION OF WOUND VAC Left 10/14/2017   Procedure: WOUND VAC CHANGE;  Surgeon: Renford Dills, MD;  Location: [facility];  Service: Vascular;  Laterality: Left;  left lower leg   APPLICATION OF WOUND VAC Left 10/18/2017   Procedure: WOUND VAC CHANGE;  Surgeon: Renford Dills, MD;  Location: [facility];  Service: Vascular;  Laterality: Left;   APPLICATION OF WOUND VAC Left 10/07/2017   Procedure: APPLICATION OF WOUND VAC;  Surgeon: Renford Dills, MD;  Location: [facility];  Service: Vascular;  Laterality: Left;   APPLICATION OF WOUND VAC Left 11/17/2022   Procedure: Wound vac exchange;  Surgeon: Renford Dills, MD;  Location: [facility];  Service: Vascular;  Laterality: Left;   APPLICATION OF WOUND VAC Left 11/10/2022   Procedure: APPLICATION OF WOUND VAC;  Surgeon: Renford Dills, MD;  Location: [facility];  Service: Vascular;  Laterality: Left;   APPLICATION OF WOUND VAC Left 11/24/2022   Procedure: WOUND VAC REMOVAL;  Surgeon: Renford Dills, MD;  Location: [facility];  Service: Vascular;  Laterality: Left;   APPLICATION OF WOUND VAC Left 11/12/2022   Procedure: APPLICATION OF WOUND VAC;  Surgeon: Renford Dills, MD;  Location: [facility];  Service: Vascular;  Laterality: Left;   APPLICATION OF WOUND VAC Left 01/06/2023   Procedure: APPLICATION OF WOUND VAC;  Surgeon: Renford Dills, MD;  Location: [facility];  Service: Vascular;  Laterality: Left;   AV FISTULA INSERTION W/ RF MAGNETIC GUIDANCE Right 07/19/2018   Procedure: AV FISTULA INSERTION W/RF MAGNETIC GUIDANCE;  Surgeon: Renford Dills, MD;  Location: [facility];  Service: Cardiovascular;  Laterality: Right;   AV FISTULA PLACEMENT Left 05/11/2019   Procedure: ARTERIOVENOUS (AV) FISTULA CREATION (RADIOCEPHALIC );  Surgeon: Renford Dills, MD;  Location: [facility];  Service: Vascular;  Laterality: Left;   AV FISTULA PLACEMENT Left 02/20/2020   Procedure: INSERTION OF ARTERIOVENOUS (AV) GORE-TEX GRAFT LEFT  ARM ( FOREARM LOOP );  Surgeon: Renford Dills, MD;  Location: [facility];  Service: Vascular;  Laterality: Left;   CATARACT EXTRACTION W/PHACO Left 11/01/2018   Procedure: CATARACT EXTRACTION PHACO AND INTRAOCULAR LENS PLACEMENT (IOC)-LEFT, DIABETIC-INSULIN DEPENDENT;  Surgeon: Nevada Crane, MD;  Location: [facility];  Service: Ophthalmology;  Laterality: Left;  [place] 00:41.8 CDE 4.61 Fluid Pack Lot # [id number]   CATARACT EXTRACTION W/PHACO Right 04/27/2019   Procedure: CATARACT EXTRACTION PHACO AND INTRAOCULAR LENS PLACEMENT (IOC);  Surgeon: Nevada Crane, MD;  Location: [facility];  Service: Ophthalmology;  Laterality: Right;  [place] 00:34 CDE 1.97 FLUID PACK LOT # [id number]    COLONOSCOPY WITH PROPOFOL N/A 05/12/2020   Procedure: COLONOSCOPY WITH PROPOFOL;  Surgeon: [place], Boykin Nearing, MD;  Location: [facility];  Service: Gastroenterology;  Laterality: N/A;   DIALYSIS/PERMA CATHETER INSERTION N/A 02/23/2019   Procedure: DIALYSIS/PERMA CATHETER INSERTION;  Surgeon: Annice Needy, MD;  Location: [facility];  Service: Cardiovascular;  Laterality: N/A;   DIALYSIS/PERMA CATHETER REMOVAL N/A 04/03/2020   Procedure: DIALYSIS/PERMA CATHETER REMOVAL;  Surgeon: Renford Dills, MD;  Location: [facility];  Service: Cardiovascular;  Laterality: N/A;   DIALYSIS/PERMA CATHETER REMOVAL N/A 02/03/2022   Procedure: DIALYSIS/PERMA CATHETER REMOVAL;  Surgeon: Renford Dills, MD;  Location: [facility];  Service: Cardiovascular;  Laterality: N/A;   I & D EXTREMITY Left 10/11/2017   Procedure: IRRIGATION AND DEBRIDEMENT EXTREMITY;  Surgeon: Renford Dills, MD;  Location: [facility];  Service: Vascular;  Laterality: Left;   INCISION AND DRAINAGE ABSCESS Right 10/07/2017   Procedure: INCISION AND DRAINAGE ABSCESS;  Surgeon: Renford Dills, MD;  Location: [facility];  Service: Vascular;  Laterality: Right;   INCISION AND DRAINAGE OF WOUND Left 11/17/2022   Procedure: DEBRIDEMENT  WOUND;  Surgeon: Renford Dills, MD;  Location: [facility];  Service: Vascular;  Laterality: Left;   INCISION AND DRAINAGE OF WOUND Left 11/24/2022   Procedure: DEBRIDEMENT WOUND LEFT LOWER EXTREMITY;  Surgeon: Renford Dills, MD;  Location: [facility];  Service: Vascular;  Laterality: Left;   IRRIGATION AND DEBRIDEMENT ABSCESS Left 10/03/2017   Procedure: IRRIGATION AND DEBRIDEMENT ABSCESS with debridement of skin, soft tissue, muscle 50sq cm;  Surgeon: Annice Needy, MD;  Location: [facility];  Service: General;  Laterality: Left;   LOWER EXTREMITY ANGIOGRAPHY Left 10/15/2022   Procedure: Lower Extremity Angiography;  Surgeon: Renford Dills, MD;  Location: [facility];  Service: Cardiovascular;  Laterality: Left;   TEE WITHOUT CARDIOVERSION N/A 03/29/2023   Procedure: TRANSESOPHAGEAL ECHOCARDIOGRAM;  Surgeon: Antonieta Iba, MD;  Location: [facility];  Service: Cardiovascular;  Laterality: N/A;   TEMPORARY DIALYSIS CATHETER  02/20/2019   Procedure: TEMPORARY DIALYSIS CATHETER;  Surgeon: Renford Dills, MD;  Location: [facility];  Service: Cardiovascular;;   UPPER EXTREMITY ANGIOGRAPHY Left 09/18/2019   Procedure: UPPER EXTREMITY ANGIOGRAPHY;  Surgeon: Renford Dills, MD;  Location: [facility];  Service: Cardiovascular;  Laterality: Left;   WOUND DEBRIDEMENT Left 11/10/2022   Procedure: DEBRIDEMENT WOUND;  Surgeon: Renford Dills, MD;  Location: [facility];  Service: Vascular;  Laterality: Left;   WOUND DEBRIDEMENT Left 11/12/2022   Procedure: DEBRIDEMENT WOUND;  Surgeon: Renford Dills, MD;  Location: [facility];  Service: Vascular;  Laterality: Left;   WOUND DEBRIDEMENT Left 01/06/2023   Procedure: DEBRIDEMENT WOUND;  Surgeon: Renford Dills, MD;  Location: [facility];  Service: Vascular;  Laterality: Left;    Social History Social History   Tobacco Use   Smoking status: Former    Types: Cigars    Passive exposure: Past   Smokeless tobacco: Former     Quit date: 09/30/2017   Tobacco comments:    occasional  Vaping Use   Vaping status: Never Used  Substance Use Topics   Alcohol use: Yes    Alcohol/week: 1.0 standard drink of alcohol    Types: 1 Cans of beer per week    Comment: beer   Drug use: Not Currently    Family History Family History  Problem Relation Age of Onset   Diabetes Father    Kidney disease Father    Diabetes Daughter     Allergies  Allergen Reactions   Lisinopril Swelling    Lips mouth    Furosemide Cough    Light headed, blurry vision, weakness.   Latex Itching   Silicone Itching  Adhesive [Tape] Itching and Rash     REVIEW OF SYSTEMS (Negative unless checked)  Constitutional: [] Weight loss  [] Fever  [] Chills Cardiac: [] Chest pain   [] Chest pressure   [] Palpitations   [] Shortness of breath when laying flat   [] Shortness of breath with exertion. Vascular:  [] Pain in legs with walking   [] Pain in legs at rest  [] History of DVT   [] Phlebitis   [x] Swelling in legs   [] Varicose veins   [] Non-healing ulcers Pulmonary:   [] Uses home oxygen   [] Productive cough   [] Hemoptysis   [] Wheeze  [x] COPD   [] Asthma Neurologic:  [] Dizziness   [] Seizures   [] History of stroke   [] History of TIA  [] Aphasia   [] Vissual changes   [] Weakness or numbness in arm   [x] Weakness or numbness in leg Musculoskeletal:   [] Joint swelling   [x] Joint pain   [] Low back pain Hematologic:  [] Easy bruising  [] Easy bleeding   [] Hypercoagulable state   [] Anemic Gastrointestinal:  [] Diarrhea   [] Vomiting  [] Gastroesophageal reflux/heartburn   [] Difficulty swallowing. Genitourinary:  [x] Chronic kidney disease   [] Difficult urination  [] Frequent urination   [] Blood in urine Skin:  [] Rashes   [] Ulcers  Psychological:  [] History of anxiety   []  History of major depression.  Physical Examination  There were no vitals filed for this visit. There is no height or weight on file to calculate BMI. Gen: WD/WN, NAD Head: [place]/AT, No temporalis  wasting.  Ear/Nose/Throat: Hearing grossly intact, nares w/o erythema or drainage Eyes: PER, EOMI, sclera nonicteric.  Neck: Supple, no gross masses or lesions.  No JVD.  Pulmonary:  Good air movement, no audible wheezing, no use of accessory muscles.  Cardiac: RRR, precordium non-hyperdynamic. Vascular:   weak thrill and weak bruit.   Moderate venous stasis changes to the legs bilaterally.  3+ soft pitting edema. CEAP [id number]  Vessel Right Left  Radial Palpable Palpable  Brachial Palpable Palpable  Gastrointestinal: soft, non-distended. No guarding/no peritoneal signs.  Musculoskeletal: M/S 5/5 throughout.  No deformity.  Neurologic: CN 2-12 intact. Pain and light touch intact in extremities.  Symmetrical.  Speech is fluent. Motor exam as listed above. Psychiatric: Judgment intact, Mood & affect appropriate for pt's clinical situation. Dermatologic: No rashes or ulcers noted.  No changes consistent with cellulitis.   CBC Lab Results  Component Value Date   WBC 5.7 03/29/2023   HGB 10.9 (L) 03/29/2023   HCT 34.0 (L) 03/29/2023   MCV 93.2 03/29/2023   PLT 151 03/29/2023    BMET    Component Value Date/Time   NA 138 03/29/2023 0620   K 4.1 03/29/2023 0620   CL 99 03/29/2023 0620   CO2 24 03/29/2023 0620   GLUCOSE 124 (H) 03/29/2023 0620   BUN 82 (H) 03/29/2023 0620   CREATININE 13.30 (H) 03/29/2023 0620   CALCIUM 9.5 03/29/2023 0620   GFRNONAA 4 (L) 03/29/2023 0620   GFRAA 9 (L) 02/19/2020 1316   CrCl cannot be calculated (Patient's most recent lab result is older than the maximum 21 days allowed.).  COAG Lab Results  Component Value Date   INR 1.0 03/24/2023   INR 1.1 11/12/2022   INR 1.0 11/10/2022    Radiology No results found.   Assessment/Plan 1. End stage renal disease (HCC) (Primary) Recommend:  The patient is experiencing increasing problems with their dialysis access.  Patient should have a fistulagram of the left forearm loop graft with the  intention for intervention.  The intention for intervention is to restore  appropriate flow and prevent thrombosis and possible loss of the access.  As well as improve the quality of dialysis therapy.  The risks, benefits and alternative therapies were reviewed in detail with the patient.  All questions were answered.  The patient agrees to proceed with angio/intervention.    The patient will follow up with me in the office after the procedure.   2. Chronic venous insufficiency No surgery or intervention at this point in time.     I have had a long discussion with the patient regarding venous insufficiency and why it  causes symptoms, specifically venous ulceration. I have discussed with the patient the chronic skin changes that accompany venous insufficiency and the long term sequela such as infection and recurring  ulceration.  Patient will continue [employer] which he has been getting at the wound care center.  His Unna boot is being changed weekly drainage permitting.   In addition, behavioral modification including several periods of elevation of the lower extremities during the day will be continued. Achieving a position with the ankles at heart level was stressed to the patient   The patient is instructed to begin routine exercise, especially walking on a daily basis   In the future the patient can be assessed for graduated compression stockings or wraps as well as a Lymph Pump once the ulcers are healed.  3. Resistant hypertension Continue antihypertensive medications as already ordered, these medications have been reviewed and there are no changes at this time.  4. Chronic obstructive pulmonary disease, unspecified COPD type (HCC) Continue pulmonary medications and aerosols as already ordered, these medications have been reviewed and there are no changes at this time.   5. Type 2 diabetes mellitus with hyperglycemia, with long-term current use of insulin (HCC) Continue hypoglycemic  medications as already ordered, these medications have been reviewed and there are no changes at this time.  Hgb A1C to be monitored as already arranged by primary service    [name], MD  09/10/2023 12:45 PM

## 2023-09-12 ENCOUNTER — Telehealth (INDEPENDENT_AMBULATORY_CARE_PROVIDER_SITE_OTHER): Payer: Self-pay

## 2023-09-12 ENCOUNTER — Encounter (INDEPENDENT_AMBULATORY_CARE_PROVIDER_SITE_OTHER): Payer: Self-pay | Admitting: Vascular Surgery

## 2023-09-12 ENCOUNTER — Ambulatory Visit (INDEPENDENT_AMBULATORY_CARE_PROVIDER_SITE_OTHER): Payer: Medicare HMO | Admitting: Vascular Surgery

## 2023-09-12 ENCOUNTER — Encounter: Payer: Medicare HMO | Attending: Physician Assistant | Admitting: Physician Assistant

## 2023-09-12 ENCOUNTER — Ambulatory Visit (INDEPENDENT_AMBULATORY_CARE_PROVIDER_SITE_OTHER): Payer: Medicare HMO

## 2023-09-12 VITALS — BP 126/79 | HR 56 | Resp 18 | Ht 76.0 in | Wt 394.0 lb

## 2023-09-12 DIAGNOSIS — N186 End stage renal disease: Secondary | ICD-10-CM

## 2023-09-12 DIAGNOSIS — E1165 Type 2 diabetes mellitus with hyperglycemia: Secondary | ICD-10-CM | POA: Diagnosis not present

## 2023-09-12 DIAGNOSIS — L97822 Non-pressure chronic ulcer of other part of left lower leg with fat layer exposed: Secondary | ICD-10-CM | POA: Insufficient documentation

## 2023-09-12 DIAGNOSIS — I87321 Chronic venous hypertension (idiopathic) with inflammation of right lower extremity: Secondary | ICD-10-CM | POA: Insufficient documentation

## 2023-09-12 DIAGNOSIS — Z794 Long term (current) use of insulin: Secondary | ICD-10-CM | POA: Diagnosis not present

## 2023-09-12 DIAGNOSIS — I87312 Chronic venous hypertension (idiopathic) with ulcer of left lower extremity: Secondary | ICD-10-CM | POA: Diagnosis not present

## 2023-09-12 DIAGNOSIS — Z992 Dependence on renal dialysis: Secondary | ICD-10-CM | POA: Insufficient documentation

## 2023-09-12 DIAGNOSIS — I890 Lymphedema, not elsewhere classified: Secondary | ICD-10-CM | POA: Diagnosis not present

## 2023-09-12 DIAGNOSIS — I1A0 Resistant hypertension: Secondary | ICD-10-CM

## 2023-09-12 DIAGNOSIS — I872 Venous insufficiency (chronic) (peripheral): Secondary | ICD-10-CM

## 2023-09-12 DIAGNOSIS — E1122 Type 2 diabetes mellitus with diabetic chronic kidney disease: Secondary | ICD-10-CM | POA: Insufficient documentation

## 2023-09-12 DIAGNOSIS — J449 Chronic obstructive pulmonary disease, unspecified: Secondary | ICD-10-CM | POA: Diagnosis not present

## 2023-09-12 DIAGNOSIS — E11622 Type 2 diabetes mellitus with other skin ulcer: Secondary | ICD-10-CM | POA: Diagnosis not present

## 2023-09-12 NOTE — Telephone Encounter (Signed)
 Spoke with the patient and he is scheduled with Dr. Gilda Crease for a left arm fistulagram at the [facility] on 09/16/23 with a 12:00 pm arrival time. Pre-procedure instructions were discussed and patient stated he understood the instructions.

## 2023-09-13 DIAGNOSIS — Z992 Dependence on renal dialysis: Secondary | ICD-10-CM | POA: Diagnosis not present

## 2023-09-13 DIAGNOSIS — N186 End stage renal disease: Secondary | ICD-10-CM | POA: Diagnosis not present

## 2023-09-13 DIAGNOSIS — E8779 Other fluid overload: Secondary | ICD-10-CM | POA: Diagnosis not present

## 2023-09-14 DIAGNOSIS — Z992 Dependence on renal dialysis: Secondary | ICD-10-CM | POA: Diagnosis not present

## 2023-09-14 DIAGNOSIS — N186 End stage renal disease: Secondary | ICD-10-CM | POA: Diagnosis not present

## 2023-09-14 DIAGNOSIS — E8779 Other fluid overload: Secondary | ICD-10-CM | POA: Diagnosis not present

## 2023-09-14 NOTE — Progress Notes (Signed)
 [name] [name] [name] ([id number]) [id number] Page 1 of 12 Visit Report for 09/12/2023 Chief Complaint Document Details Patient Name: Date of Service: [name] [name] [name] 09/12/2023 8:00 [name] Medical Record Number: [id number] Patient Account Number: [id number] Date of Birth/Sex: Treating RN: 1971/06/22 (52 y.o. [name] [name]) [name] [name] Primary Care Provider: Jeffie Craze Other Clinician: Referring Provider: Treating Provider/Extender: Bethena Andre Jeffie Craze Devra in Treatment: 29 Information Obtained from: Patient Chief Complaint Left LE Ulcer Electronic Signature(s) Signed: 09/12/2023 8:34:31 AM By: Bethena Andre PA-C Entered By: Bethena Andre on 09/12/2023 08:34:30 -------------------------------------------------------------------------------- HPI Details Patient Name: Date of Service: [name] LT, CO SLO [name] [name] 09/12/2023 8:00 [name] Medical Record Number: [id number] Patient Account Number: [id number] Date of Birth/Sex: Treating RN: [birth year] (52 y.o. [name] [name] [name] Primary Care Provider: Jeffie Craze Other Clinician: Referring Provider: Treating Provider/Extender: Bethena Andre Jeffie Craze Devra in Treatment: 29 History of Present Illness HPI Description: 12/28/17; this is [name] now 53 year old man who is [name] type II diabetic. He was hospitalized from 10/01/17 through 10/19/17. He had an MSSA soft tissue and skin infection. 2 open areas on the left leg were identified he has [name] smaller area on the left medial calf superiorly just below the knee and [name] wound just above the left ankle on the posterior medial aspect. I think both of these were surgical IandD sites when he was in the hospital. He was discharged with [name] wound VAC at that point however this is since been taken off. He follows with Dr. Epifanio for the [facility] and he is still on chronic Keflex  at 500 twice [name] day. At that time he was hospitalized his hemoglobin A1c was 15.1 however if I'm reading his endocrinologist notes  correctly that is improved. He has been following with Dr. Dreama at vein and vascular and he has been applying calcium  alginate and Unna boots. He has home health changing the dressing. They have also been attempting to get him external compression pumps although the patient is unaware whether they've been approved by insurance at this point. as mentioned he has [name] smaller clean wound on the right lateral calf just below the knee and he has [name] much larger area just above the left ankle medially and posteriorly. Our intake nurse reported greenish purulent looking drainage.the patient did have surgical material sent to pathology in February. This showed chronic abscess The patient also has lymphedema stage III in the left greater than right lower extremities. He has [name] history of blisters with wounds but these of all were always healed. The patient thinks that the lymphedema may have been present since he was about 53 years old i.e. about 30 years. He does not have graded pressure stockings and has not worn stockings. He does not have [name] distant history of DVT PE or phlebitis. He has not been systemically unwell fever no chills. He states that his Lasix  is recently been reduced. He tells me his kidney function is at 30% and he has been followed by Dr. Dennise of nephrology. At one IVO, MOGA [name] ([id number]) [id number] Page 2 of 12 point he was on Lasix  80 twice [name] day however that's been cut back and he is now on Lasix  at 20 twice [name] day. The patient has [name] history of PAD listed in his records although he comes from Dr. Dreama I don't think is felt to have significant PAD. ABIs in our clinic were noncompressible bilaterally. 01/04/18; patient has [name] large wound on the left lateral lower calf and  [name] small wound on the left medial upper calf. He has been to see his nephrologist who changed him to Demadex  40 mg [name] day. I'm hopeful this will help with his systemic fluid overload. He  also has stage III lymphedema. Really no change in the 2 wounds since last week 01/11/18; the patient is down 13 pounds. He put his stage III lymphedema left leg in 4K compression last week and there is less edema fluid however we still haven't been able to communicate with home health but apparently it is [facility] but the dressings have not been changed. The patient noted an odor last week. He is also had compression pumps ordered by [facility] this as [name] not completed the paperwork stage. 01/18/18; patient continues to lose weight. Stage III lymphedema in the left greater than right leg under for alert compression. The major wound is on the left lateral ankle area. He apparently has bilateral compression pumps being brought to his house, these were ordered by [facility] Notable for the fact today he had some blisters on the right anterior leg together with some skin nodules. This is no doubt secondary to severe lymphedema. 01/25/18; the patient has obtained his compression pumps and is using them per vein and vascular instructions 3 times [name] day for an hour. He also saw [name] of vein and vascular. He was felt to have venous insufficiency but did not suggest any intervention also improved edema. It was suggested that he have compression stockings 20-30 mm on [name] daily basis in addition to compression pumps. The patient arrives in clinic today with [name] layer of unna under for layer compression. he seems to have some trouble with the degree of compression. He has open areas on the left lateral ankle area which is his major wound left upper medial calf and [name] superficial open area on the right anterior shin area which was blistered last week. He has skin changes on the right anterior calf which I think are no doubt secondary to lymphedema skin nodules etc. 02/01/18; the patient comes in telling us  his nephrologist have to his torsemide . Unfortunately today he is put on 7 pounds by  our scales. He has blisters all over the anterior and medial part of his right calf and [name] new open wound. He also has soupy green drainage coming out of the left lateral calf /ankle wound. 02/08/18; culture I did last week of the left lateral ankle wound grew both Pseudomonas and Morganella. He is on Keflex  from Dr. Epifanio in the hospital. I will need to review these notes.in any case Keflex  is not going to cover these 2 organisms. I'm probably going to added ciprofloxacin  today for 1 week. [name] lot of drainage that looks purulent last week. He is not complaining of pain however he has managed to put on 10 pounds in 2 weeks by our scales in this clinic. He is going to see his nephrologist tomorrow 02/15/18; he completed the ciprofloxacin  I gave him last week. Notable that he is up to 379 pounds today which is up 16 pounds from 2 weeks ago. He is complaining of orthopnea but doesn't have any chest pain. 02/22/18; he continues to have weight gain. R intake nurse reports again purulent green drainage coming out of the lateral wound on the lateral left calf. He has small open area on the right anterior leg. His torsemide  was increased to 2 tablets [name] day I believe this is 40 mg last week  in response to the call admitted to Dr. Aureliano office. He follows up with Dr. Dennise and Dr. Jama tomorrow 03/01/18 his weight essentially stable today at 383 pounds. Drainage out of the left lateral wound on the lower left calf/ankle is [name] lot less. Culture last time grew Pseudomonas. I put him on cefdinir. He has been to Dr. Aureliano office no adjustments in his diuretics. Dr. Olivia prescribed [name] wraparound stocking for the right leg.there is no open area on the right leg. He has [name] superficial area on the left medial calf, left posterior calf and in the large area on the left lateral however this looks better 03/15/18; weight is not up to 393 pounds. He saw his nephrologist yesterday Dr. Dennise will increase the Demadex  I'm  hopeful this will help with the edema control. I'm using silver alginate to all his wounds. In particular the left lateral ankle looks better. Unfortunately he has new open areas on the right lateral calf that will include use of his compression stocking at least in the short to medium term. He has new wounds 3 on the right lateral calf. One of these has some size however all numerous superficial 03/22/18; his weight is stabilized [name] bit. Just adjustment of his diuretics by his nephrologist. There is no doubt he has some degree of systemic fluid overload on top of severe left greater than right lymphedema. 2 weeks ago tried to transition him to stockings on the right leg however he developed re-breakdown of skin on the right leg and we had to put him back in compression last week. He also uses external compression pumps and claims to be compliant Continued concern about his depression today 03/29/18; several ongoing issues with this patient; He no longer has home health coverage apparently secondary to [name] lapse in insurance. He is apparently transitioning from short for long-term disability. [facility] was changing his compression wraps on Monday and Friday. He has gained 10 pounds since last week Sees Dr. Dreama tomorrow Saw his primary doctor last week about the depression. It sounds as though he declined pharmacologic management. He seems somewhat better today. We were concerned last week when he came. Somewhat better today He is using his compression pumps once [name] day at home, I have asked for twice [name] day if possible especially on the left leg Follows up with his nephrologist in mid-August. He is managing his diuretic for I think stage IV chronic renal failure Paradoxically his wounds actually look better 04/19/18; the patient has not been seen since I last saw him 3 weeks ago. He saw Dr. Jama of vascular surgery on 03/30/18 I believe he put him in [name] 20/30 stocking bilaterally with [name]  wraparound extremitease stocking. He has not been putting anything specifically on the wound. More problematic than that he has not been wearing the stockings he is at home. He has been using his external compression pumps once per day according to him on [name] rare occasion twice On [name] psychosocial level the patient is now on long-term disability and is applying for COBRA therefore he is between insurances. He has not been able to follow up with Dr. Dennise who is his nephrologist as [name] result. As noted his weight is up to 407 pounds today. He promises me he'll follow-up with Dr. Dennise 05/03/18; he hasn't been here in 2 weeks now. He apparently has been wearing [name] compression sock on the left leg. Massive increase in edema 3 large open wounds on the  left anterior leg that were probably blisters. Significant deterioration in the left lateral ankle wound that we've been doing as his most problematic wound. He still does not have his insurance issues wrapped up. His weight is well over 400 pounds. 05/10/18; arrives today with better looking edema control in the left leg. He has been using his compression pumps twice [name] day. We also wrapped it is left leg for there he's been using his pumps so there is much better edema control. Most of new wounds from last week look [name] lot better. Even the refractory area on the lower left lateral ankle looks [name] lot better to me today. He follows up with Dr. Dennise this morning [nephrology] 05/17/18 patient arrives today with [name] lot less edema in the left leg. His weight is gone down 7 pounds. He tells me he saw Dr. Dennise but his torsemide  was not adjusted. He is using the palms 3 times [name] day. There is been quite an improvement in the remaining wound on the left lateral ankle Weekly visit for follow-up of bilateral lower extremity wounds related to severe lymphedema and probably some degree of systemic fluid overload from chronic renal failure stage IV. His weight is up this week to over  400 pounds. He noted increasing edema in the right leg late last week. He took his compression stocking off he did not increase the frequency of this compression pump use. He developed [name] large blister on the back of the right calf. He also has [name] new opened blister on the left lateral calf in addition to the wound that we've been using on the distal left lower calf area and we've been using silver alginate. He tells me that he has an ultrasound which is [name] DVT rule out and I think [name] reflux study ordered by Dr. dreama 05/31/18; weekly visit. He went to see vein and vascular this week apparently they remove the 4-layer compression on the left and put an Unna boot on him in replacement. He's got more swelling in the left leg is resolved. That being said his left lower Wound is better. He still has [name] fairly large wound on the right posterior calf this was not disturbed. He has [name] follow-up appointment with Dr. Aureliano nephrologist next week 06/07/2018; the patient arrives today with [name] history that he took both his compression wraps off 3 days ago in order to take [name] shower. He has bilateral severe tense blisters. [name] lot of weeping erythema especially on the lateral left calf. Almost circumferential blisters on the right. Multiple areas of epithelial breakdown. He does not complain of any pain fever chills. He states he has been using his compression pump in some form of stocking that I could not really determine the type. He did not come into the clinic with anything on his legs. He tells me he has an appointment with Dr. Francina his nephrologist I believe this Friday. Paradoxically his weight is actually down to 385 pounds I believe last week he was over 400 06/14/18; arrives with better looking wound surfaces today on both legs. There is no further blistering however there are still areas that aren't epithelialized on the right anterior, right lateral, left posterior and left medial. His original wound just above  the left ankle laterally is still open moist. His weight was about the same today. He tells me that his nephrologist increase the torsemide  from 40/20/09/1939/40 twice [name] day [place], Daily [name] ([id number]) [phone].pdf Page 3 of 12 06/21/18; both  his wraps fell down to his mid calf. Has [name] result he has multiple blisters across the anterior right leg above with the wraps ultimately watched. He also has blisters on the posterior part of the left calf 2. His original wound on the left lateral calf is hard to see any open area. There is however [name] divot. Which is going to be difficult to deal with into the future until the edema in the left leg is controlled. 2 small superficial areas remain He tells us  his weight was 386 pounds on his scale at home. According to our scale he is up 5 pounds. He is not on [name] fluid restriction 06/28/18; patient's weight is gone up 2 pounds since last time. He has weeping edema and open superficial wounds on the right posterior right lateral and right anterior calf. He has [name] small open area on the left anterior probably left posterior calf and the original wound on the left lateral calf appears to be just about closed He is being planned for [name] dialysis shunt in the right arm through vein and vascular incoordination with his nephrologist Dr. Dennise He claims to be using his compression pumps twice [name] day. He has lymphedema and no doubt systemic fluid volume overload from stage IV chronic renal failure 07/04/18; patient's weight is up into the 396 range. He is supposed to see his cardiologist tomorrow and plans are being made for [name] shunt by Dr. Jama. He still has significant open wounds/draining areas on the right anterior and right posterior calf. Several small open areas on the left which are less in terms of wound area on the right which is surprising given the fact the left is the larger most lymphedematous leg 07/26/2018 She is seen today for  follow-up and management of lateral posterior lower leg wound and bilateral lymphoedema. He has [name] very flat affect and depressed mood today. Unable to elicit much information from him today. No thoughts of harm to self identified. Recently had [name] follow-up with vascular vein for fistula placement to initiate dialysis in the right arm. He has 2 dressings on the right arm from the fistula procedure with no bleeding or drainage noted on the dressing. He does have [name] large amount of bruising in the surrounding to puncture areas on the right lower arm from the fistula placement. No pain elicited with palpation of the right arm. He denies [name] diminished sensation in that right lower arm as well. He does have [name] small wound to the left lateral posterior lower leg. No visual drainage present to either leg. However the wound to the left lower leg does have [name] foul odor even after cleaning it. The left is the larger most lymphedematous leg with the right Leg still having [name] significant amount of edema. No drainage or blisters present on either leg today. He states that he is using the lymphedema pumps twice [name] day. Not too sure if he is compliant with actually using the lymphedema pumps based on the amount of edema that he has in his legs. Weight increased from 396.1 to 401.6. No recent fevers, chills, or shortness of breath. 08/02/18; the patient only has one remaining wound on the left lateral calf which is still open. This is in the resultant [place] shaped area which was once the site of the major wound. He does not have any open areas on the right leg nor additional wounds on the left no blistering. As usual the left leg is much larger than  the right. He comes in today stating that he took the wraps off on the right leg on Friday and since then he has presumably been wearing his stocking which is [name] wraparound variant stocking on the right. States he took the 4-layer compression off his left leg this morning to shower.  He is seeing vein and vascular tomorrow. He states he is using his compression pumps once or twice [name] day. He also admits that he has been on his feet [name] lot 08/09/18; the patient has the left leg healed except for the original wound site on the left medial Just above the ankle. On the right he has [name] new wound on the right lateral calf. He saw vein and vascular last week and he is been back in his pressure stockings and wraparound stockings. He also is been put on metolazone  3 times [name] week by nephrology along with his torsemide . I'm hopeful that this will help with some of the lower extremity edema. I've always felt that he has lymphedema but there may be [name] secondary component contributing 08/16/18; the patient used his own junk slight stockings to both legs last week. He claims he is pumping once to twice [name] day at home. Here rise with his legs looking visibly less edematous. His usual left greater than right secondary to lymphedema. He is tolerating the metolazone  3 times [name] week along with the torsemide . His weight was down 2 pounds. He follows up with nephrology soon for follow-up lab work I think in early January. He does not have any open areas on the right leg he still has the crevice on the lateral left calf just above the ankle there is 2 small open areas here I am not sure that this is ever going to be free of an open area although certainly not worsening. More problematic lead he has 2 blistered areas just above the wrist on the left lateral calf 09/06/2018; the patient arrives with much larger legs than I remember seeing [name] month ago especially on the right. He has 1 open area on the left lateral calf and he had [name] 4 layer compression on this on arrival in the clinic. This was apparently put on by [facility] in response to [name] new open wound. He claims he is pumping once or more often twice [name] day although I really have [name] hard time believing it. He came in with nothing but stockings on his legs. There is  tremendous increase in the edema bilaterally 1/22; the patient only has the one area that is not totally closed and that is the divot on the left lateral calf. I do not think we are going to be able to do anything further to this unless he loses edema fluid in his left leg when he starts dialysis which I gather is sometime soon. He is using his own wraparound compression garment. He is using the compression pumps twice [name] day. 2/19; the patient does not have any open wounds on the right leg but both legs left and the right have extensive edema is usually worse on the left. He has [name] tense blister on the left anterior leg. Using his compression pumps usually once [name] day per his description. He does not come in with the compression stockings 2/26. Right leg remains without any open wounds that he has his own stocking. The tense blister on the left anterior leg is closed over. He still has the open area on the left lateral mid calf and  then the divot injury area which I do not think is going to heal just above the left ankle. 3/11; the patient arrives with all the wounds on both legs healed. He has his stocking with the wraparound juxta lite on the right. He has 1 for the left. He initiated dialysis on Monday and we will see what effect that has on his lower extremity edema. He has his compression pumps and he plans to use them at least once [name] day Readmission 01/20/2022 Mr. [name] [name] is [name] 53 year old male with [name] past medical history of type 1 diabetes on hemodialysis that presents to the clinic for [name] 1 year history of wound to the right side of his neck. He states that he had [name] central line in place when he started hemodialysis while his graft matured. He states that the area where the line was removed never healed. He reports serosanguineous drainage and odor to the area over the past year. He has not taken antibiotics for this issue. He currently denies systemic signs of infection. He currently keeps  the area covered with [name] Band-Aid. 5/31; patient presents for follow-up. He has been taking doxycycline  without issues. He had [name] culture done at last clinic visit that showed abundant Streptococcus anginosus sensitive to penicillin. I will go ahead and switch him to Augmentin. He has been using mupirocin ointment to the wound bed and covering this with Hydrofera Blue. Upon inspection today there is [name] piece of dressing tightly adhered in the wound bed that appears to be connected to underlying structures. He obtains his neck ultrasound tomorrow. He denies systemic signs of infection. 6/14; Patient presents for follow-up. Patient had an ultrasound to his neck that showed [name] prior PermCath still present is [name] retained foreign body. This was causing his wound not to heal. On 6/7 the foreign body was removed by Dr.Schnier. Since then the area has healed up nicely. He has no issues or complaints today. He denies any drainage, open wounds or discomfort to the previous wound site. 02/16/2023 [name] [name] is [name] 53 year old male with [name] past medical history of end-stage renal disease on hemodialysis, Type 2 diabetes, And lymphedema/venous insufficiency That presents to the clinic for evaluation of his right lower extremity. He has wounds to his left lower extremity for several months but these are being managed by vein and vascular with [name] wound VAC. On 02/04/2023 patient was evaluated for new blisters to the right lower extremity and potential cellulitis at vein and vascular and was recommended to go to the ED. He was started on oral antibiotics by the ED and referred to our clinic for follow-up to assure that the right leg wounds improved. He had an Unna boot placed by vein and vascular two days prior. He has home health that changes the unna boot and wound vac 2 times weekly. He currently denies signs of infection. There are no open wounds to the right lower extremity. 7/10; Patient was seen last month for left  lower extremity wounds however this was in the care of vein and vascular and I recommended he discuss with his doctor if he would like to transfer care to the wound care center. At this time patient states that he would like to follow here. He has been using [name] wound VAC to the left lower extremity large wound and he has been keeping the area covered to the dorsal left foot wound. He would like to stop the wound VAC as He has had trouble  with the seal. He currently denies systemic signs of infection. 7/17; patient presents for follow-up. We have been using Dakin's wet-to-dry dressings under Kerlix/Coban to the left lower extremity large wound and Silver alginate to the left dorsal foot wound. Wounds are smaller. Patient has no issues or complaints. 7/24; patient presents for follow-up. We have been using Dakin's wet-to-dry dressings to the larger wound and silver alginate to the smaller dorsal foot wound to the left lower extremity all under Kerlix/Coban. Wounds are smaller today. Patient has no issues or complaints. Originally patient had [name] wound VAC. He still has this in his possession but we are not using it. I recommended He returned this. He would also like new lymphedema pumps. [name] [name], [name] [name] ([id number]) [id number] Page 4 of 12 7/31; patient presents for follow-up. We have been using Dakin's wet-to-dry dressings under Kerlix/Coban to the left lower extremity larger wound and silver alginate to the smaller dorsal left foot wound. He was noted to have [name] fever during dialysis and was found to be bacteremic. He has been in the hospital for the past few days. He is receiving IV cefazolin  during dialysis now. He has no issues or complaints about the wounds. The left dorsal foot wound is healed today. He currently denies systemic signs of infection. 8/7; patient presents for follow-up. We have been using silver alginate under Kerlix/Coban to the left lower extremity. Wound is  smaller. 8/14; this patient has [name] reasonably large wound on the left leg. Dimensions are marginally impaired. He has chronic venous insufficiency and his edema is poorly controlled. We have been using kerlix Coban compression. Patient also has external compression pumps at home but he has not been using them. He also has dialysis Tuesday Thursdays and Saturdays 8/21; patient presents for follow-up. We have been using silver cell under compression therapy. The compression wrap was increased to 3 layer at last clinic visit. He states he tolerated this well. He has no issues or complaints today. Wound is smaller. 8/28; patient presents for follow-up. We have been using silver alginate under compression therapy to the left lower extremity. Wound appears well-healing. Unfortunately has developed another wound just lateral to this that looks like it started out as [name] blister. 9/4; patient presents for follow-up. We have been using silver alginate under compression therapy to the left lower extremity. Wound is smaller. New wound at last clinic visit on the lateral aspect has closed. 9/11; patient presents for follow-up. We have been using silver alginate under compression therapy to the left lower extremity. Wound continues to become smaller. 9/18; patient with longstanding chronic lymphedema. Wound on the left medial lower leg. We have been using silver alginate under Urgo K2 lite. The wound is improved in terms of appearance and measurements per our intake nurse 9/25; chronic longstanding lymphedema. Wound on the left medial lower leg. Measurements are better this week. Last week I think I changed him to Hydrofera Blue still under and Urgo K2 lite 10/2; chronic longstanding lymphedema with [name] weak wound on the left anterior medial lower leg. Not much change in dimension this week slightly hyper granulated. I thought I had changed him to Hydrofera Blue however according to our intake nurse he has been  receiving silver alginate. We are using an Urgo K2 light compression because TBI's on the left were only 0.33 is ABIs were noncompressible. He follows with vein and vascular for his arterial insufficiency 10/9; wound is measuring smaller on the left anterior lower leg. We have been  using Hydrofera Blue under Urgo K2 compression. He tells me that he is getting/has received new compression pumps I have asked him to use it 1 hour twice [name] day even over our compression wraps. 10/16; we have been using Hydrofera Blue under Urgo K2 compression. Wound is measuring smaller and looks reasonably healthy. Unfortunately he did not get [name] chance to consistently use the compression pumps twice [name] day. 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 Furthermore he works nights 10/23; Hydrofera Blue under Urgo K2 compression for [name] wound on his left medial lower leg. The wound measures smaller today. Noticeable improvement in the edema he tells me he is using his external compression pumps once [name] day at least sometimes twice. He has [name] difficult work schedule and works nights 10/30; we are using Hydrofera Blue under Urgo K2 compression. Wound continues to gradually get smaller. He is using his compression pumps at home once [name] day 07-06-2023 upon evaluation today patient appears to be doing well currently in regard to his wound. This is actually showing signs of excellent epithelization and granulation at this point. Fortunately I do not see any evidence of worsening overall and I do believe that the patient is making good headway here towards closure. 07-11-2023 upon evaluation today patient appears to be doing well currently in regard to his wound. He does seem to be somewhat drying the skin around his as well I think that we may want to switch to Tubigrip since he does well with this and subsequently switch to collagen as well which should allow this to be able to heal faster with the benefit of  the collagen. 07-20-2023 upon evaluation today patient's wound unfortunately is showing signs of being [name] bit worse. He notes that he is having some issues here with areas that he scratch that are now open. This is definitely not what we are looking for. I discussed with him that he needs to make sure that he is not scratching these areas. The region is scar tissue and this means that he is gena have [name] lot of issues if he scratches it with skin breakdown which is not what we are looking for at all. The patient voiced understanding. 07-27-2023 upon evaluation patient appears to be doing excellent in regard to his leg ulcer I feel like he is getting very close to complete resolution. Fortunately I do not see any evidence of worsening overall and I believe that the patient is making really good headway here towards closure which is great news. 08-10-2023 upon evaluation today patient appears to be doing well currently in regard to his leg ulcer. Has been tolerating the dressing changes without complication and the good news is he actually is doing quite well in fact he appears to be pretty much completely healed. I do not see any signs of infection at this time. 08-17-2023 upon evaluation today patient appears to be doing well currently in regard to his wound. He has been tolerating the dressing changes without complication. Fortunately there does not appear to be any signs of active infection at this time which is great news. No fevers, chills, nausea, vomiting, or diarrhea. 09-12-2023 unfortunately the patient does not appear to be doing nearly as well as what it was previously. Fortunately I do not see any signs of systemic infection though I am concerned about local infection here. I do believe this is getting need to be addressed. I do not  see any obvious open wounds right now that he has had [name] very close point where I think some could happen if we do not get this under control. Electronic  Signature(s) Signed: 09/12/2023 8:43:08 AM By: Bethena Ferraris PA-C Entered By: Bethena Ferraris on 09/12/2023 08:43:08 Troung, Curley [name] ([id number]) [id number] Page 5 of 12 -------------------------------------------------------------------------------- Physical Exam Details Patient Name: Date of Service: [name] LT, CO SLO [name] [name] 09/12/2023 8:00 [name] Medical Record Number: [id number] Patient Account Number: [id number] Date of Birth/Sex: Treating RN: 1971-07-01 (52 y.o. [name] [name] [name] Primary Care Provider: Jeffie Craze Other Clinician: Referring Provider: Treating Provider/Extender: Bethena Ferraris Jeffie Craze Devra in Treatment: 29 Constitutional Obese and well-hydrated in no acute distress. Respiratory normal breathing without difficulty. Psychiatric this patient is able to make decisions and demonstrates good insight into disease process. Alert and Oriented x 3. pleasant and cooperative. Notes Upon inspection patient's wound bed actually showed signs again of still being close although I do not think this can remain that way for longer if we do not get this under control as soon as possible. I discussed with the patient that there are no open wounds right now but he is guided using his pumps regularly and if he is not doing that then he is gena end up in [name] difficult situation this is gena be reopened and he could even end up in the hospital septic. Electronic Signature(s) Signed: 09/12/2023 8:43:27 AM By: Bethena Ferraris PA-C Entered By: Bethena Ferraris on 09/12/2023 08:43:27 -------------------------------------------------------------------------------- Physician Orders Details Patient Name: Date of Service: [name] LT, CO SLO [name] [name] 09/12/2023 8:00 [name] Medical Record Number: [id number] Patient Account Number: [id number] Date of Birth/Sex: Treating RN: 1971/03/17 (52 y.o. [name] [name]) [name] [name] Primary Care Provider: Jeffie Craze Other Clinician: Referring Provider: Treating  Provider/Extender: Bethena Ferraris Jeffie Craze Devra in Treatment: 29 The following information was scribed by: [name] [name] The information was scribed for: Bethena Ferraris Verbal / Phone Orders: No Diagnosis Coding ICD-10 Coding Code Description [phone] Non-pressure chronic ulcer of other part of left lower leg with fat layer exposed I87.312 Chronic venous hypertension (idiopathic) with ulcer of left lower extremity I89.0 Lymphedema, not elsewhere classified I87.321 Chronic venous hypertension (idiopathic) with inflammation of right lower extremity E11.622 Type 2 diabetes mellitus with other skin ulcer Nobile, Taysen [name] ([id number]) [id number] Page 6 of 12 Follow-up Appointments Return Appointment in 1 week. Edema Control - Orders / Instructions Tubigrip double layer applied - left leg Patient to wear own compression stockings. Remove compression stockings every night before going to bed and put on every morning when getting up. Elevate, Exercise Daily and [name] void Standing for Long Periods of Time. Patient to wear own Velcro compression garment. Remove compression stockings every night before going to bed and put on every morning when getting up. Elevate legs to the level of the heart and pump ankles as often as possible - left and right Elevate leg(s) parallel to the floor when sitting. Compression Pump: Use compression pump on left lower extremity for 60 minutes, twice daily. Compression Pump: Use compression pump on right lower extremity for 60 minutes, twice daily. Other: - start Doxy ASAP Patient Medications llergies: lisinopril, latex, tape, occlusive adhesive, furosemide  [name] Notifications Medication Indication Start End 09/12/2023 doxycycline  hyclate DOSE 1 - oral 100 mg capsule - 1 capsule oral twice [name] day x 14 days Electronic Signature(s) Signed: 09/12/2023 8:44:48 AM By: Bethena Ferraris PA-C Entered By: Bethena Ferraris on 09/12/2023  08:44:47 -------------------------------------------------------------------------------- Problem List Details Patient Name: Date  of Service: [name] LT, CO SLO [name] [name] 09/12/2023 8:00 [name] Medical Record Number: [id number] Patient Account Number: [id number] Date of Birth/Sex: Treating RN: Aug 09, 1971 (52 y.o. [name] [name]) [name] [name] Primary Care Provider: Jeffie Craze Other Clinician: Referring Provider: Treating Provider/Extender: Bethena Andre Jeffie Craze Devra in Treatment: 29 Active Problems ICD-10 Encounter Code Description Active Date MDM Diagnosis [id number] Non-pressure chronic ulcer of other part of left lower leg with fat layer exposed6/19/2024 No Yes I87.312 Chronic venous hypertension (idiopathic) with ulcer of left lower extremity 02/16/2023 No Yes I89.0 Lymphedema, not elsewhere classified 03/23/2023 No Yes I87.321 Chronic venous hypertension (idiopathic) with inflammation of right lower 02/16/2023 No Yes extremity E11.622 Type 2 diabetes mellitus with other skin ulcer 02/16/2023 No Yes Kempner, Zeth [name] ([id number]) [id number] Page 7 of 12 Inactive Problems ICD-10 Code Description Active Date Inactive Date L97.522 Non-pressure chronic ulcer of other part of left foot with fat layer exposed 03/16/2023 03/16/2023 Resolved Problems Electronic Signature(s) Signed: 09/12/2023 8:34:27 AM By: Bethena Andre PA-C Entered By: Bethena Andre on 09/12/2023 08:34:27 -------------------------------------------------------------------------------- Progress Note Details Patient Name: Date of Service: [name] LT, CO SLO [name] [name] 09/12/2023 8:00 [name] Medical Record Number: [id number] Patient Account Number: [id number] Date of Birth/Sex: Treating RN: 1971-08-23 (52 y.o. [name] [name] [name] Primary Care Provider: Jeffie Craze Other Clinician: Referring Provider: Treating Provider/Extender: Bethena Andre Jeffie Craze Devra in Treatment: 29 Subjective Chief Complaint Information obtained  from Patient Left LE Ulcer History of Present Illness (HPI) 12/28/17; this is [name] now 53 year old man who is [name] type II diabetic. He was hospitalized from 10/01/17 through 10/19/17. He had an MSSA soft tissue and skin infection. 2 open areas on the left leg were identified he has [name] smaller area on the left medial calf superiorly just below the knee and [name] wound just above the left ankle on the posterior medial aspect. I think both of these were surgical IandD sites when he was in the hospital. He was discharged with [name] wound VAC at that point however this is since been taken off. He follows with Dr. Epifanio for the [facility] and he is still on chronic Keflex  at 500 twice [name] day. At that time he was hospitalized his hemoglobin A1c was 15.1 however if I'm reading his endocrinologist notes correctly that is improved. He has been following with Dr. Dreama at vein and vascular and he has been applying calcium  alginate and Unna boots. He has home health changing the dressing. They have also been attempting to get him external compression pumps although the patient is unaware whether they've been approved by insurance at this point. as mentioned he has [name] smaller clean wound on the right lateral calf just below the knee and he has [name] much larger area just above the left ankle medially and posteriorly. Our intake nurse reported greenish purulent looking drainage.the patient did have surgical material sent to pathology in February. This showed chronic abscess The patient also has lymphedema stage III in the left greater than right lower extremities. He has [name] history of blisters with wounds but these of all were always healed. The patient thinks that the lymphedema may have been present since he was about 53 years old i.e. about 30 years. He does not have graded pressure stockings and has not worn stockings. He does not have [name] distant history of DVT PE or phlebitis. He has not been systemically unwell fever no chills. He  states that his Lasix  is recently been reduced. He tells me his kidney function is at  30% and he has been followed by Dr. Dennise of nephrology. At one point he was on Lasix  80 twice [name] day however that's been cut back and he is now on Lasix  at 20 twice [name] day. The patient has [name] history of PAD listed in his records although he comes from Dr. Dreama I don't think is felt to have significant PAD. ABIs in our clinic were noncompressible bilaterally. 01/04/18; patient has [name] large wound on the left lateral lower calf and [name] small wound on the left medial upper calf. He has been to see his nephrologist who changed him to Demadex  40 mg [name] day. I'm hopeful this will help with his systemic fluid overload. He also has stage III lymphedema. Really no change in the 2 wounds since last week 01/11/18; the patient is down 13 pounds. He put his stage III lymphedema left leg in 4K compression last week and there is less edema fluid however we still haven't been able to communicate with home health but apparently it is [facility] but the dressings have not been changed. The patient noted an odor last week. He is also had compression pumps ordered by [facility] this as [name] not completed the paperwork stage. 01/18/18; patient continues to lose weight. Stage III lymphedema in the left greater than right leg under for alert compression. The major wound is on the left lateral ankle area. He apparently has bilateral compression pumps being brought to his house, these were ordered by [facility] Notable for the fact today he had some blisters on the right anterior leg together with some skin nodules. This is no doubt secondary to severe lymphedema. 01/25/18; the patient has obtained his compression pumps and is using them per vein and vascular instructions 3 times [name] day for an hour. He also saw Urian Martenson, Keegan [name] ([id number]) [id number] Page 8 of 12 of vein and vascular. He  was felt to have venous insufficiency but did not suggest any intervention also improved edema. It was suggested that he have compression stockings 20-30 mm on [name] daily basis in addition to compression pumps. The patient arrives in clinic today with [name] layer of unna under for layer compression. he seems to have some trouble with the degree of compression. He has open areas on the left lateral ankle area which is his major wound left upper medial calf and [name] superficial open area on the right anterior shin area which was blistered last week. He has skin changes on the right anterior calf which I think are no doubt secondary to lymphedema skin nodules etc. 02/01/18; the patient comes in telling us  his nephrologist have to his torsemide . Unfortunately today he is put on 7 pounds by our scales. He has blisters all over the anterior and medial part of his right calf and [name] new open wound. He also has soupy green drainage coming out of the left lateral calf /ankle wound. 02/08/18; culture I did last week of the left lateral ankle wound grew both Pseudomonas and Morganella. He is on Keflex  from Dr. Epifanio in the hospital. I will need to review these notes.in any case Keflex  is not going to cover these 2 organisms. I'm probably going to added ciprofloxacin  today for 1 week. [name] lot of drainage that looks purulent last week. He is not complaining of pain however he has managed to put on 10 pounds in 2 weeks by our scales in this clinic. He is going to see his nephrologist tomorrow  02/15/18; he completed the ciprofloxacin  I gave him last week. Notable that he is up to 379 pounds today which is up 16 pounds from 2 weeks ago. He is complaining of orthopnea but doesn't have any chest pain. 02/22/18; he continues to have weight gain. R intake nurse reports again purulent green drainage coming out of the lateral wound on the lateral left calf. He has small open area on the right anterior leg. His torsemide  was increased to  2 tablets [name] day I believe this is 40 mg last week in response to the call admitted to Dr. Aureliano office. He follows up with Dr. Dennise and Dr. Jama tomorrow 03/01/18 his weight essentially stable today at 383 pounds. Drainage out of the left lateral wound on the lower left calf/ankle is [name] lot less. Culture last time grew Pseudomonas. I put him on cefdinir. He has been to Dr. Aureliano office no adjustments in his diuretics. Dr. Olivia prescribed [name] wraparound stocking for the right leg.there is no open area on the right leg. He has [name] superficial area on the left medial calf, left posterior calf and in the large area on the left lateral however this looks better 03/15/18; weight is not up to 393 pounds. He saw his nephrologist yesterday Dr. Dennise will increase the Demadex  I'm hopeful this will help with the edema control. I'm using silver alginate to all his wounds. In particular the left lateral ankle looks better. Unfortunately he has new open areas on the right lateral calf that will include use of his compression stocking at least in the short to medium term. He has new wounds 3 on the right lateral calf. One of these has some size however all numerous superficial 03/22/18; his weight is stabilized [name] bit. Just adjustment of his diuretics by his nephrologist. There is no doubt he has some degree of systemic fluid overload on top of severe left greater than right lymphedema. 2 weeks ago tried to transition him to stockings on the right leg however he developed re-breakdown of skin on the right leg and we had to put him back in compression last week. He also uses external compression pumps and claims to be compliant Continued concern about his depression today 03/29/18; several ongoing issues with this patient; He no longer has home health coverage apparently secondary to [name] lapse in insurance. He is apparently transitioning from short for long-term disability. [facility] was changing his compression  wraps on Monday and Friday. He has gained 10 pounds since last week Sees Dr. Dreama tomorrow Saw his primary doctor last week about the depression. It sounds as though he declined pharmacologic management. He seems somewhat better today. We were concerned last week when he came. Somewhat better today He is using his compression pumps once [name] day at home, I have asked for twice [name] day if possible especially on the left leg Follows up with his nephrologist in mid-August. He is managing his diuretic for I think stage IV chronic renal failure Paradoxically his wounds actually look better 04/19/18; the patient has not been seen since I last saw him 3 weeks ago. He saw Dr. Jama of vascular surgery on 03/30/18 I believe he put him in [name] 20/30 stocking bilaterally with [name] wraparound extremitease stocking. He has not been putting anything specifically on the wound. More problematic than that he has not been wearing the stockings he is at home. He has been using his external compression pumps once per day according to him on  [name] rare occasion twice On [name] psychosocial level the patient is now on long-term disability and is applying for COBRA therefore he is between insurances. He has not been able to follow up with Dr. Dennise who is his nephrologist as [name] result. As noted his weight is up to 407 pounds today. He promises me he'll follow-up with Dr. Dennise 05/03/18; he hasn't been here in 2 weeks now. He apparently has been wearing [name] compression sock on the left leg. Massive increase in edema 3 large open wounds on the left anterior leg that were probably blisters. Significant deterioration in the left lateral ankle wound that we've been doing as his most problematic wound. He still does not have his insurance issues wrapped up. His weight is well over 400 pounds. 05/10/18; arrives today with better looking edema control in the left leg. He has been using his compression pumps twice [name] day. We also wrapped it is left leg  for there he's been using his pumps so there is much better edema control. Most of new wounds from last week look [name] lot better. Even the refractory area on the lower left lateral ankle looks [name] lot better to me today. He follows up with Dr. Dennise this morning [nephrology] 05/17/18 patient arrives today with [name] lot less edema in the left leg. His weight is gone down 7 pounds. He tells me he saw Dr. Dennise but his torsemide  was not adjusted. He is using the palms 3 times [name] day. There is been quite an improvement in the remaining wound on the left lateral ankle Weekly visit for follow-up of bilateral lower extremity wounds related to severe lymphedema and probably some degree of systemic fluid overload from chronic renal failure stage IV. His weight is up this week to over 400 pounds. He noted increasing edema in the right leg late last week. He took his compression stocking off he did not increase the frequency of this compression pump use. He developed [name] large blister on the back of the right calf. He also has [name] new opened blister on the left lateral calf in addition to the wound that we've been using on the distal left lower calf area and we've been using silver alginate. He tells me that he has an ultrasound which is [name] DVT rule out and I think [name] reflux study ordered by Dr. dreama 05/31/18; weekly visit. He went to see vein and vascular this week apparently they remove the 4-layer compression on the left and put an Unna boot on him in replacement. He's got more swelling in the left leg is resolved. That being said his left lower Wound is better. He still has [name] fairly large wound on the right posterior calf this was not disturbed. He has [name] follow-up appointment with Dr. Aureliano nephrologist next week 06/07/2018; the patient arrives today with [name] history that he took both his compression wraps off 3 days ago in order to take [name] shower. He has bilateral severe tense blisters. [name] lot of weeping erythema especially  on the lateral left calf. Almost circumferential blisters on the right. Multiple areas of epithelial breakdown. He does not complain of any pain fever chills. He states he has been using his compression pump in some form of stocking that I could not really determine the type. He did not come into the clinic with anything on his legs. He tells me he has an appointment with Dr. Francina his nephrologist I believe this Friday. Paradoxically his weight is actually  down to 385 pounds I believe last week he was over 400 06/14/18; arrives with better looking wound surfaces today on both legs. There is no further blistering however there are still areas that aren't epithelialized on the right anterior, right lateral, left posterior and left medial. His original wound just above the left ankle laterally is still open moist. His weight was about the same today. He tells me that his nephrologist increase the torsemide  from 40/20/09/1939/40 twice [name] day 06/21/18; both his wraps fell down to his mid calf. Has [name] result he has multiple blisters across the anterior right leg above with the wraps ultimately watched. He also has blisters on the posterior part of the left calf 2. His original wound on the left lateral calf is hard to see any open area. There is however [name] divot. Which is going to be difficult to deal with into the future until the edema in the left leg is controlled. 2 small superficial areas remain He tells us  his weight was 386 pounds on his scale at home. According to our scale he is up 5 pounds. He is not on [name] fluid restriction 06/28/18; patient's weight is gone up 2 pounds since last time. He has weeping edema and open superficial wounds on the right posterior right lateral and right anterior calf. He has [name] small open area on the left anterior probably left posterior calf and the original wound on the left lateral calf appears to be just about closed He is being planned for [name] dialysis shunt in the right  arm through vein and vascular incoordination with his nephrologist Dr. Dennise He claims to be using his compression pumps twice [name] day. He has lymphedema and no doubt systemic fluid volume overload from stage IV chronic renal failure 07/04/18; patient's weight is up into the 396 range. He is supposed to see his cardiologist tomorrow and plans are being made for [name] shunt by Dr. Jama. He still has significant open wounds/draining areas on the right anterior and right posterior calf. Several small open areas on the left which are less in terms of wound area on the right which is surprising given the fact the left is the larger most lymphedematous leg Vine, Ace [name] ([id number]) [id number] Page 9 of 12 07/26/2018 She is seen today for follow-up and management of lateral posterior lower leg wound and bilateral lymphoedema. He has [name] very flat affect and depressed mood today. Unable to elicit much information from him today. No thoughts of harm to self identified. Recently had [name] follow-up with vascular vein for fistula placement to initiate dialysis in the right arm. He has 2 dressings on the right arm from the fistula procedure with no bleeding or drainage noted on the dressing. He does have [name] large amount of bruising in the surrounding to puncture areas on the right lower arm from the fistula placement. No pain elicited with palpation of the right arm. He denies [name] diminished sensation in that right lower arm as well. He does have [name] small wound to the left lateral posterior lower leg. No visual drainage present to either leg. However the wound to the left lower leg does have [name] foul odor even after cleaning it. The left is the larger most lymphedematous leg with the right Leg still having [name] significant amount of edema. No drainage or blisters present on either leg today. He states that he is using the lymphedema pumps twice [name] day. Not too sure if he is compliant with actually  using  the lymphedema pumps based on the amount of edema that he has in his legs. Weight increased from 396.1 to 401.6. No recent fevers, chills, or shortness of breath. 08/02/18; the patient only has one remaining wound on the left lateral calf which is still open. This is in the resultant [place] shaped area which was once the site of the major wound. He does not have any open areas on the right leg nor additional wounds on the left no blistering. As usual the left leg is much larger than the right. He comes in today stating that he took the wraps off on the right leg on Friday and since then he has presumably been wearing his stocking which is [name] wraparound variant stocking on the right. States he took the 4-layer compression off his left leg this morning to shower. He is seeing vein and vascular tomorrow. He states he is using his compression pumps once or twice [name] day. He also admits that he has been on his feet [name] lot 08/09/18; the patient has the left leg healed except for the original wound site on the left medial Just above the ankle. On the right he has [name] new wound on the right lateral calf. He saw vein and vascular last week and he is been back in his pressure stockings and wraparound stockings. He also is been put on metolazone  3 times [name] week by nephrology along with his torsemide . I'm hopeful that this will help with some of the lower extremity edema. I've always felt that he has lymphedema but there may be [name] secondary component contributing 08/16/18; the patient used his own junk slight stockings to both legs last week. He claims he is pumping once to twice [name] day at home. Here rise with his legs looking visibly less edematous. His usual left greater than right secondary to lymphedema. He is tolerating the metolazone  3 times [name] week along with the torsemide . His weight was down 2 pounds. He follows up with nephrology soon for follow-up lab work I think in early January. He does not have any open  areas on the right leg he still has the crevice on the lateral left calf just above the ankle there is 2 small open areas here I am not sure that this is ever going to be free of an open area although certainly not worsening. More problematic lead he has 2 blistered areas just above the wrist on the left lateral calf 09/06/2018; the patient arrives with much larger legs than I remember seeing [name] month ago especially on the right. He has 1 open area on the left lateral calf and he had [name] 4 layer compression on this on arrival in the clinic. This was apparently put on by [facility] in response to [name] new open wound. He claims he is pumping once or more often twice [name] day although I really have [name] hard time believing it. He came in with nothing but stockings on his legs. There is tremendous increase in the edema bilaterally 1/22; the patient only has the one area that is not totally closed and that is the divot on the left lateral calf. I do not think we are going to be able to do anything further to this unless he loses edema fluid in his left leg when he starts dialysis which I gather is sometime soon. He is using his own wraparound compression garment. He is using the compression pumps twice [name] day. 2/19; the patient does not  have any open wounds on the right leg but both legs left and the right have extensive edema is usually worse on the left. He has [name] tense blister on the left anterior leg. Using his compression pumps usually once [name] day per his description. He does not come in with the compression stockings 2/26. Right leg remains without any open wounds that he has his own stocking. The tense blister on the left anterior leg is closed over. He still has the open area on the left lateral mid calf and then the divot injury area which I do not think is going to heal just above the left ankle. 3/11; the patient arrives with all the wounds on both legs healed. He has his stocking with the wraparound juxta lite on  the right. He has 1 for the left. He initiated dialysis on Monday and we will see what effect that has on his lower extremity edema. He has his compression pumps and he plans to use them at least once [name] day Readmission 01/20/2022 Mr. [name] Yo is [name] 53 year old male with [name] past medical history of type 1 diabetes on hemodialysis that presents to the clinic for [name] 1 year history of wound to the right side of his neck. He states that he had [name] central line in place when he started hemodialysis while his graft matured. He states that the area where the line was removed never healed. He reports serosanguineous drainage and odor to the area over the past year. He has not taken antibiotics for this issue. He currently denies systemic signs of infection. He currently keeps the area covered with [name] Band-Aid. 5/31; patient presents for follow-up. He has been taking doxycycline  without issues. He had [name] culture done at last clinic visit that showed abundant Streptococcus anginosus sensitive to penicillin. I will go ahead and switch him to Augmentin. He has been using mupirocin ointment to the wound bed and covering this with Hydrofera Blue. Upon inspection today there is [name] piece of dressing tightly adhered in the wound bed that appears to be connected to underlying structures. He obtains his neck ultrasound tomorrow. He denies systemic signs of infection. 6/14; Patient presents for follow-up. Patient had an ultrasound to his neck that showed [name] prior PermCath still present is [name] retained foreign body. This was causing his wound not to heal. On 6/7 the foreign body was removed by Dr.Schnier. Since then the area has healed up nicely. He has no issues or complaints today. He denies any drainage, open wounds or discomfort to the previous wound site. 02/16/2023 [name] [name] is [name] 53 year old male with [name] past medical history of end-stage renal disease on hemodialysis, Type 2 diabetes, And lymphedema/venous insufficiency  That presents to the clinic for evaluation of his right lower extremity. He has wounds to his left lower extremity for several months but these are being managed by vein and vascular with [name] wound VAC. On 02/04/2023 patient was evaluated for new blisters to the right lower extremity and potential cellulitis at vein and vascular and was recommended to go to the ED. He was started on oral antibiotics by the ED and referred to our clinic for follow-up to assure that the right leg wounds improved. He had an Unna boot placed by vein and vascular two days prior. He has home health that changes the unna boot and wound vac 2 times weekly. He currently denies signs of infection. There are no open wounds to the right lower extremity. 7/10; Patient  was seen last month for left lower extremity wounds however this was in the care of vein and vascular and I recommended he discuss with his doctor if he would like to transfer care to the wound care center. At this time patient states that he would like to follow here. He has been using [name] wound VAC to the left lower extremity large wound and he has been keeping the area covered to the dorsal left foot wound. He would like to stop the wound VAC as He has had trouble with the seal. He currently denies systemic signs of infection. 7/17; patient presents for follow-up. We have been using Dakin's wet-to-dry dressings under Kerlix/Coban to the left lower extremity large wound and Silver alginate to the left dorsal foot wound. Wounds are smaller. Patient has no issues or complaints. 7/24; patient presents for follow-up. We have been using Dakin's wet-to-dry dressings to the larger wound and silver alginate to the smaller dorsal foot wound to the left lower extremity all under Kerlix/Coban. Wounds are smaller today. Patient has no issues or complaints. Originally patient had [name] wound VAC. He still has this in his possession but we are not using it. I recommended He returned this.  He would also like new lymphedema pumps. 7/31; patient presents for follow-up. We have been using Dakin's wet-to-dry dressings under Kerlix/Coban to the left lower extremity larger wound and silver alginate to the smaller dorsal left foot wound. He was noted to have [name] fever during dialysis and was found to be bacteremic. He has been in the hospital for the past few days. He is receiving IV cefazolin  during dialysis now. He has no issues or complaints about the wounds. The left dorsal foot wound is healed today. He currently denies systemic signs of infection. 8/7; patient presents for follow-up. We have been using silver alginate under Kerlix/Coban to the left lower extremity. Wound is smaller. 8/14; this patient has [name] reasonably large wound on the left leg. Dimensions are marginally impaired. He has chronic venous insufficiency and his edema is poorly controlled. We have been using kerlix Coban compression. Patient also has external compression pumps at home but he has not been using them. He also has dialysis Tuesday Thursdays and Saturdays 8/21; patient presents for follow-up. We have been using silver cell under compression therapy. The compression wrap was increased to 3 layer at last clinic visit. He states he tolerated this well. He has no issues or complaints today. Wound is smaller. 8/28; patient presents for follow-up. We have been using silver alginate under compression therapy to the left lower extremity. Wound appears well-healing. Unfortunately has developed another wound just lateral to this that looks like it started out as [name] blister. [name] [name], [name] [name] ([id number]) [id number] Page 10 of 12 9/4; patient presents for follow-up. We have been using silver alginate under compression therapy to the left lower extremity. Wound is smaller. New wound at last clinic visit on the lateral aspect has closed. 9/11; patient presents for follow-up. We have been using silver  alginate under compression therapy to the left lower extremity. Wound continues to become smaller. 9/18; patient with longstanding chronic lymphedema. Wound on the left medial lower leg. We have been using silver alginate under Urgo K2 lite. The wound is improved in terms of appearance and measurements per our intake nurse 9/25; chronic longstanding lymphedema. Wound on the left medial lower leg. Measurements are better this week. Last week I think I changed him to Hydrofera Blue still under and Urgo  K2 lite 10/2; chronic longstanding lymphedema with [name] weak wound on the left anterior medial lower leg. Not much change in dimension this week slightly hyper granulated. I thought I had changed him to Hydrofera Blue however according to our intake nurse he has been receiving silver alginate. We are using an Urgo K2 light compression because TBI's on the left were only 0.33 is ABIs were noncompressible. He follows with vein and vascular for his arterial insufficiency 10/9; wound is measuring smaller on the left anterior lower leg. We have been using Hydrofera Blue under Urgo K2 compression. He tells me that he is getting/has received new compression pumps I have asked him to use it 1 hour twice [name] day even over our compression wraps. 10/16; we have been using Hydrofera Blue under Urgo K2 compression. Wound is measuring smaller and looks reasonably healthy. Unfortunately he did not get [name] chance to consistently use the compression pumps twice [name] day. 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 Furthermore he works nights 10/23; Hydrofera Blue under Urgo K2 compression for [name] wound on his left medial lower leg. The wound measures smaller today. Noticeable improvement in the edema he tells me he is using his external compression pumps once [name] day at least sometimes twice. He has [name] difficult work schedule and works nights 10/30; we are using Hydrofera Blue under Urgo K2 compression.  Wound continues to gradually get smaller. He is using his compression pumps at home once [name] day 07-06-2023 upon evaluation today patient appears to be doing well currently in regard to his wound. This is actually showing signs of excellent epithelization and granulation at this point. Fortunately I do not see any evidence of worsening overall and I do believe that the patient is making good headway here towards closure. 07-11-2023 upon evaluation today patient appears to be doing well currently in regard to his wound. He does seem to be somewhat drying the skin around his as well I think that we may want to switch to Tubigrip since he does well with this and subsequently switch to collagen as well which should allow this to be able to heal faster with the benefit of the collagen. 07-20-2023 upon evaluation today patient's wound unfortunately is showing signs of being [name] bit worse. He notes that he is having some issues here with areas that he scratch that are now open. This is definitely not what we are looking for. I discussed with him that he needs to make sure that he is not scratching these areas. The region is scar tissue and this means that he is gena have [name] lot of issues if he scratches it with skin breakdown which is not what we are looking for at all. The patient voiced understanding. 07-27-2023 upon evaluation patient appears to be doing excellent in regard to his leg ulcer I feel like he is getting very close to complete resolution. Fortunately I do not see any evidence of worsening overall and I believe that the patient is making really good headway here towards closure which is great news. 08-10-2023 upon evaluation today patient appears to be doing well currently in regard to his leg ulcer. Has been tolerating the dressing changes without complication and the good news is he actually is doing quite well in fact he appears to be pretty much completely healed. I do not see any signs of  infection at this time. 08-17-2023 upon evaluation today patient appears to  be doing well currently in regard to his wound. He has been tolerating the dressing changes without complication. Fortunately there does not appear to be any signs of active infection at this time which is great news. No fevers, chills, nausea, vomiting, or diarrhea. 09-12-2023 unfortunately the patient does not appear to be doing nearly as well as what it was previously. Fortunately I do not see any signs of systemic infection though I am concerned about local infection here. I do believe this is getting need to be addressed. I do not see any obvious open wounds right now that he has had [name] very close point where I think some could happen if we do not get this under control. Objective Constitutional Obese and well-hydrated in no acute distress. Vitals Time Taken: 8:27 AM, Height: 76 in, Weight: 400 lbs, BMI: 48.7, Temperature: 98.1 F, Pulse: 57 bpm, Respiratory Rate: 18 breaths/min, Blood Pressure: 156/71 mmHg. Respiratory normal breathing without difficulty. Psychiatric this patient is able to make decisions and demonstrates good insight into disease process. Alert and Oriented x 3. pleasant and cooperative. General Notes: Upon inspection patient's wound bed actually showed signs again of still being close although I do not think this can remain that way for longer if we do not get this under control as soon as possible. I discussed with the patient that there are no open wounds right now but he is guided using his pumps regularly and if he is not doing that then he is gena end up in [name] difficult situation this is gena be reopened and he could even end up in the hospital septic. Assessment [name] [name], [name] [name] [id number]) [id number] Page 11 of 12 Active Problems ICD-10 Non-pressure chronic ulcer of other part of left lower leg with fat layer exposed Chronic venous hypertension (idiopathic) with  ulcer of left lower extremity Lymphedema, not elsewhere classified Chronic venous hypertension (idiopathic) with inflammation of right lower extremity Type 2 diabetes mellitus with other skin ulcer Plan Follow-up Appointments: Return Appointment in 1 week. Edema Control - Orders / Instructions: Tubigrip double layer applied - left leg Patient to wear own compression stockings. Remove compression stockings every night before going to bed and put on every morning when getting up. Elevate, Exercise Daily and Avoid Standing for Long Periods of Time. Patient to wear own Velcro compression garment. Remove compression stockings every night before going to bed and put on every morning when getting up. Elevate legs to the level of the heart and pump ankles as often as possible - left and right Elevate leg(s) parallel to the floor when sitting. Compression Pump: Use compression pump on left lower extremity for 60 minutes, twice daily. Compression Pump: Use compression pump on right lower extremity for 60 minutes, twice daily. Other: - start Doxy ASAP The following medication(s) was prescribed: doxycycline  hyclate oral 100 mg capsule 1 1 capsule oral twice [name] day x 14 days starting 09/12/2023 1. I would recommend based on what we are seeing that we have the patient continue to monitor for any signs of infection worsening. I am going to get him sent [name] prescription for doxycycline  he is done well with this in the past I think that is appropriate right now as well. 2. I am going to recommend that the patient should continue to elevate his legs, use his pumps, and use the double layer Tubigrip which I think is going to be ideal here. 3. We will plan to see the patient back next week for reevaluation. He  was counseled significantly about making sure that he is doing what is needed to take care of his legs if not he is getting up in trouble here. We will see patient back for reevaluation in 1 week here in the  clinic. If anything worsens or changes patient will contact our office for additional recommendations. Electronic Signature(s) Signed: 09/12/2023 8:45:18 AM By: Bethena Ferraris PA-C Entered By: Bethena Ferraris on 09/12/2023 08:45:18 -------------------------------------------------------------------------------- SuperBill Details Patient Name: Date of Service: [name] LT, CO SLO [name] [name] 09/12/2023 Medical Record Number: [id number] Patient Account Number: [id number] Date of Birth/Sex: Treating RN: 1971/04/21 (52 y.o. [name] [name]) [name] [name] Primary Care Provider: Jeffie Craze Other Clinician: Referring Provider: Treating Provider/Extender: Bethena Ferraris Jeffie Craze Devra in Treatment: 29 Diagnosis Coding ICD-10 Codes Code Description [phone] Non-pressure chronic ulcer of other part of left lower leg with fat layer exposed I87.312 Chronic venous hypertension (idiopathic) with ulcer of left lower extremity I89.0 Lymphedema, not elsewhere classified I87.321 Chronic venous hypertension (idiopathic) with inflammation of right lower extremity Garrod, Lino [name] ([id number]) [id number] Page 12 of 12 E11.622 Type 2 diabetes mellitus with other skin ulcer Facility Procedures : CPT4 Code: [id number] Description: [phone] - WOUND CARE VISIT-LEV 2 EST PT Modifier: Quantity: 1 Physician Procedures : CPT4: Description Modifier Code [id number] - WC PHYS LEVEL 4 - EST PT ICD-10 Diagnosis Description L97.822 Non-pressure chronic ulcer of other part of left lower leg with fat layer exposed I87.312 Chronic venous hypertension (idiopathic) with ulcer of  left lower extremity I89.0 Lymphedema, not elsewhere classified I87.321 Chronic venous hypertension (idiopathic) with inflammation of right lower extremity Quantity: 1 : CPT4: [id number] Visit complexity inherent to [facility] [name]/medical care services that serve as the continuing focal point for ongoing care related to [name] patient's condition ICD-10  Diagnosis Description L97.822 Non-pressure chronic ulcer of other part of left  lower leg with fat layer exposed I87.312 Chronic venous hypertension (idiopathic) with ulcer of left lower extremity I89.0 Lymphedema, not elsewhere classified I87.321 Chronic venous hypertension (idiopathic) with inflammation of right lower extremity Quantity: 1 Electronic Signature(s) Signed: 09/12/2023 8:47:21 AM By: [name] Sailors RN Signed: 09/12/2023 4:43:18 PM By: Bethena Ferraris PA-C Previous Signature: 09/12/2023 8:45:46 AM Version By: Bethena Ferraris PA-C Entered By: [name] [name] on 09/12/2023 08:47:21

## 2023-09-14 NOTE — Progress Notes (Signed)
 [name], [name]) [id number] Page 1 of 6 Visit Report for 09/12/2023 Arrival Information Details Patient Name: Date of Service: [name], [name] [name] 09/12/2023 8:00 [name] Medical Record Number: [id number] Patient Account Number: [id number] Date of Birth/Sex: Treating RN: 08-19-1971 (53 y.o. [name]) [name] Primary Care [name]: [name] Other Clinician: Referring [name]: Treating [name]/Extender: [name] [name] [name] in Treatment: 29 Visit Information History Since Last Visit Added or deleted any medications: No Patient Arrived: Ambulatory Any new allergies or adverse reactions: No Arrival Time: 08:27 Had [name] fall or experienced change in No Accompanied By: partner activities of daily living that may affect Transfer Assistance: None risk of falls: Patient Identification Verified: Yes Signs or symptoms of abuse/neglect since last visito No Secondary Verification Process Completed: Yes Hospitalized since last visit: No Patient Requires Transmission-Based Precautions: No Implantable device outside of the clinic excluding No Patient Has Alerts: Yes cellular tissue based products placed in the center Patient Alerts: Patient on Blood Thinner since last visit: ABI R [place] TBI .61 10/11/22 Has Dressing in Place as Prescribed: Yes ABI L [place] TBI .33 10/11/22 Has Compression in Place as Prescribed: Yes Pain Present Now: No Electronic Signature(s) Signed: 09/12/2023 4:05:20 PM By: [name] Sailors RN Entered By: [name] on 09/12/2023 08:27:44 -------------------------------------------------------------------------------- Clinic Level of Care Assessment Details Patient Name: Date of Service: [name], [name] 09/12/2023 8:00 [name] Medical Record Number: [id number] Patient Account Number: [id number] Date of Birth/Sex: Treating RN: 08-24-1971 (52 y.o. [name] [name] Primary Care Davion Meara: [name] Other Clinician: Referring  [name]: Treating [name]/Extender: [name] [name] [name] in Treatment: 29 Clinic Level of Care Assessment Items TOOL 4 Quantity Score X- 1 0 Use when only an EandM is performed on FOLLOW-UP visit ASSESSMENTS - Nursing Assessment / Reassessment X- 1 10 Reassessment of [name]-morbidities (includes updates in patient status) X- 1 5 Reassessment of Adherence to Treatment Plan [name], [name] ([id number]) [id number] Page 2 of 6 ASSESSMENTS - Wound and Skin [name] ssessment / Reassessment []  - 0 Simple Wound Assessment / Reassessment - one wound []  - 0 Complex Wound Assessment / Reassessment - multiple wounds []  - 0 Dermatologic / Skin Assessment (not related to wound area) ASSESSMENTS - Focused Assessment []  - 0 Circumferential Edema Measurements - multi extremities []  - 0 Nutritional Assessment / Counseling / Intervention []  - 0 Lower Extremity Assessment (monofilament, tuning fork, pulses) []  - 0 Peripheral Arterial Disease Assessment (using hand held doppler) ASSESSMENTS - Ostomy and/or Continence Assessment and Care []  - 0 Incontinence Assessment and Management []  - 0 Ostomy Care Assessment and Management (repouching, etc.) PROCESS - Coordination of Care X - Simple Patient / Family Education for ongoing care 1 15 []  - 0 Complex (extensive) Patient / Family Education for ongoing care []  - 0 Staff obtains Chiropractor, Records, T Results / Process Orders est []  - 0 Staff telephones HHA, Nursing Homes / Clarify orders / etc []  - 0 Routine Transfer to another Facility (non-emergent condition) []  - 0 Routine Hospital Admission (non-emergent condition) []  - 0 New Admissions / Manufacturing Engineer / Ordering NPWT Apligraf, etc. , []  - 0 Emergency Hospital Admission (emergent condition) X- 1 10 Simple Discharge Coordination []  - 0 Complex (extensive) Discharge Coordination PROCESS - Special Needs []  - 0 Pediatric / Minor Patient  Management []  - 0 Isolation Patient Management []  - 0 Hearing / Language / Visual special needs []  - 0 Assessment of Community assistance (transportation, D/C planning, etc.) []  -  0 Additional assistance / Altered mentation []  - 0 Support Surface(s) Assessment (bed, cushion, seat, etc.) INTERVENTIONS - Wound Cleansing / Measurement []  - 0 Simple Wound Cleansing - one wound []  - 0 Complex Wound Cleansing - multiple wounds []  - 0 Wound Imaging (photographs - any number of wounds) []  - 0 Wound Tracing (instead of photographs) []  - 0 Simple Wound Measurement - one wound []  - 0 Complex Wound Measurement - multiple wounds INTERVENTIONS - Wound Dressings []  - 0 Small Wound Dressing one or multiple wounds []  - 0 Medium Wound Dressing one or multiple wounds []  - 0 Large Wound Dressing one or multiple wounds []  - 0 Application of Medications - topical []  - 0 Application of Medications - injection INTERVENTIONS - Miscellaneous []  - 0 External ear exam [name], [name] ([id number]) [id number] Page 3 of 6 []  - 0 Specimen Collection (cultures, biopsies, blood, body fluids, etc.) []  - 0 Specimen(s) / Culture(s) sent or taken to Lab for analysis []  - 0 Patient Transfer (multiple staff / Deitra Lift / Similar devices) []  - 0 Simple Staple / Suture removal (25 or less) []  - 0 Complex Staple / Suture removal (26 or more) []  - 0 Hypo / Hyperglycemic Management (close monitor of Blood Glucose) []  - 0 Ankle / Brachial Index (ABI) - do not check if billed separately X- 1 5 Vital Signs Has the patient been seen at the hospital within the last three years: Yes Total Score: 45 Level Of Care: New/Established - Level 2 Electronic Signature(s) Signed: 09/12/2023 4:05:20 PM By: [name] Sailors RN Entered By: [name] on 09/12/2023 08:47:08 -------------------------------------------------------------------------------- Encounter Discharge Information  Details Patient Name: Date of Service: [name], [name] 09/12/2023 8:00 [name] Medical Record Number: [id number] Patient Account Number: [id number] Date of Birth/Sex: Treating RN: 07-Nov-1970 (52 y.o. [name] [name] Primary Care Dylyn Mclaren: [name] Other Clinician: Referring Aritha Huckeba: Treating Zafiro Routson/Extender: [name] [name] [name] in Treatment: 29 Encounter Discharge Information Items Discharge Condition: Stable Ambulatory Status: Ambulatory Discharge Destination: Home Transportation: Private Auto Accompanied By: self Schedule Follow-up Appointment: Yes Clinical Summary of Care: Electronic Signature(s) Signed: 09/12/2023 8:48:59 AM By: [name] Sailors RN Entered By: [name] on 09/12/2023 08:48:59 Lower Extremity Assessment Details -------------------------------------------------------------------------------- [name] ([id number]) [id number] Page 4 of 6 Patient Name: Date of Service: [name], [name] 09/12/2023 8:00 [name] Medical Record Number: [id number] Patient Account Number: [id number] Date of Birth/Sex: Treating RN: Aug 22, 1971 (52 y.o. [name]) [name] Primary Care Laurissa Cowper: [name] Other Clinician: Referring Janki Dike: Treating Jasma Seevers/Extender: [name] [name] [name] in Treatment: 29 Electronic Signature(s) Signed: 09/12/2023 4:05:20 PM By: [name] Sailors RN Entered By: [name] on 09/12/2023 08:29:32 -------------------------------------------------------------------------------- Multi Wound Chart Details Patient Name: Date of Service: [name], [name] 09/12/2023 8:00 [name] Medical Record Number: [id number] Patient Account Number: [id number] Date of Birth/Sex: Treating RN: May 04, 1971 (52 y.o. [name] [name] Primary Care Myan Locatelli: [name] Other Clinician: Referring Aira Sallade: Treating Jood Retana/Extender: [name] [name] [name] in Treatment: 29 Vital Signs Height(in):  76 Pulse(bpm): 57 Weight(lbs): 400 Blood Pressure(mmHg): 156/71 Body Mass Index(BMI): 48.7 Temperature(F): 98.1 Respiratory Rate(breaths/min): 18 [Treatment Notes:Wound Assessments Treatment Notes] Electronic Signature(s) Signed: 09/12/2023 4:05:20 PM By: [name] Sailors RN Entered By: [name] on 09/12/2023 08:40:33 -------------------------------------------------------------------------------- Multi-Disciplinary Care Plan Details Patient Name: Date of Service: [name], [name] 09/12/2023 8:00 [name] Medical Record Number: [id number] Patient Account Number: [id number] Date of Birth/Sex: Treating RN: 10/03/1970 (52  y.o. [name] [name] Primary Care Taiya Nutting: [name] Other Clinician: Referring Maxemiliano Riel: Treating Sherron Mapp/Extender: [name] [name] [name] in Treatment: [id number] [name], [name] ([id number]) [id number] Page 5 of 6 Electronic Signature(s) Signed: 09/12/2023 8:47:34 AM By: [name] Sailors RN Entered By: [name] on 09/12/2023 08:47:34 -------------------------------------------------------------------------------- Pain Assessment Details Patient Name: Date of Service: [name], [name] 09/12/2023 8:00 [name] Medical Record Number: [id number] Patient Account Number: [id number] Date of Birth/Sex: Treating RN: [birth year] (52 y.o. [name] [name] Primary Care Keri Veale: [name] Other Clinician: Referring Keoni Havey: Treating Naturi Alarid/Extender: [name] [name] [name] in Treatment: 29 Active Problems Location of Pain Severity and Description of Pain Patient Has Paino No Site Locations Pain Management and Medication Current Pain Management: Electronic Signature(s) Signed: 09/12/2023 4:05:20 PM By: [name] Sailors RN Entered By: [name] on 09/12/2023 08:28:20 [name], [name] ([id number]) [id number] Page 6 of  6 -------------------------------------------------------------------------------- Patient/Caregiver Education Details Patient Name: Date of Service: [name], [name] 1/13/2025andnbsp8:00 [name] Medical Record Number: [id number] Patient Account Number: [id number] Date of Birth/Gender: Treating RN: June 23, 1971 (52 y.o. [name]) [name] Primary Care Physician: [name] Other Clinician: Referring Physician: Treating Physician/Extender: [name] [name] [name] in Treatment: 29 Education Assessment Education Provided To: Patient Education Topics Provided Wound/Skin Impairment: Handouts: Other: compression pumps Methods: Explain/Verbal Responses: State content correctly Electronic Signature(s) Signed: 09/12/2023 4:05:20 PM By: [name] Sailors RN Entered By: [name] on 09/12/2023 08:47:52 -------------------------------------------------------------------------------- Vitals Details Patient Name: Date of Service: [name], [name] 09/12/2023 8:00 [name] Medical Record Number: [id number] Patient Account Number: [id number] Date of Birth/Sex: Treating RN: 09/17/1970 (52 y.o. [name]) [name] Primary Care Akasha Melena: [name] Other Clinician: Referring Justeen Hehr: Treating Ananya Mccleese/Extender: [name] [name] [name] in Treatment: 29 Vital Signs Time Taken: 08:27 Temperature (F): 98.1 Height (in): 76 Pulse (bpm): 57 Weight (lbs): 400 Respiratory Rate (breaths/min): 18 Body Mass Index (BMI): 48.7 Blood Pressure (mmHg): 156/71 Reference Range: 80 - 120 mg / dl Electronic Signature(s) Signed: 09/12/2023 4:05:20 PM By: [name] Sailors RN Entered By: [name] on 09/12/2023 08:28:09

## 2023-09-15 DIAGNOSIS — E8779 Other fluid overload: Secondary | ICD-10-CM | POA: Diagnosis not present

## 2023-09-15 DIAGNOSIS — Z992 Dependence on renal dialysis: Secondary | ICD-10-CM | POA: Diagnosis not present

## 2023-09-15 DIAGNOSIS — N186 End stage renal disease: Secondary | ICD-10-CM | POA: Diagnosis not present

## 2023-09-16 ENCOUNTER — Encounter
Admission: RE | Disposition: A | Discharge status: Home / Self Care | Payer: Self-pay | Source: Home / Self Care | Attending: Vascular Surgery

## 2023-09-16 ENCOUNTER — Ambulatory Visit
Admission: RE | Admit: 2023-09-16 | Discharge: 2023-09-16 | Disposition: A | Discharge status: Home / Self Care | Payer: Medicare HMO | Attending: Vascular Surgery | Admitting: Vascular Surgery

## 2023-09-16 ENCOUNTER — Other Ambulatory Visit: Payer: Self-pay

## 2023-09-16 DIAGNOSIS — Z992 Dependence on renal dialysis: Secondary | ICD-10-CM | POA: Diagnosis not present

## 2023-09-16 DIAGNOSIS — Z833 Family history of diabetes mellitus: Secondary | ICD-10-CM | POA: Insufficient documentation

## 2023-09-16 DIAGNOSIS — Z794 Long term (current) use of insulin: Secondary | ICD-10-CM | POA: Diagnosis not present

## 2023-09-16 DIAGNOSIS — T82858A Stenosis of vascular prosthetic devices, implants and grafts, initial encounter: Secondary | ICD-10-CM | POA: Diagnosis not present

## 2023-09-16 DIAGNOSIS — N186 End stage renal disease: Secondary | ICD-10-CM | POA: Diagnosis not present

## 2023-09-16 DIAGNOSIS — I120 Hypertensive chronic kidney disease with stage 5 chronic kidney disease or end stage renal disease: Secondary | ICD-10-CM | POA: Insufficient documentation

## 2023-09-16 DIAGNOSIS — Z841 Family history of disorders of kidney and ureter: Secondary | ICD-10-CM | POA: Insufficient documentation

## 2023-09-16 DIAGNOSIS — Z87891 Personal history of nicotine dependence: Secondary | ICD-10-CM | POA: Insufficient documentation

## 2023-09-16 DIAGNOSIS — E1122 Type 2 diabetes mellitus with diabetic chronic kidney disease: Secondary | ICD-10-CM | POA: Insufficient documentation

## 2023-09-16 DIAGNOSIS — Z955 Presence of coronary angioplasty implant and graft: Secondary | ICD-10-CM | POA: Insufficient documentation

## 2023-09-16 DIAGNOSIS — Y832 Surgical operation with anastomosis, bypass or graft as the cause of abnormal reaction of the patient, or of later complication, without mention of misadventure at the time of the procedure: Secondary | ICD-10-CM | POA: Insufficient documentation

## 2023-09-16 HISTORY — PX: A/V FISTULAGRAM: CATH118298

## 2023-09-16 LAB — GLUCOSE, CAPILLARY: Glucose-Capillary: 142 mg/dL — ABNORMAL HIGH (ref 70–99)

## 2023-09-16 LAB — POTASSIUM (ARMC VASCULAR LAB ONLY): Potassium (ARMC vascular lab): 4.5 mmol/L (ref 3.5–5.1)

## 2023-09-16 SURGERY — A/V FISTULAGRAM
Anesthesia: Moderate Sedation | Laterality: Left

## 2023-09-16 MED ORDER — CEFAZOLIN SODIUM-DEXTROSE 1-4 GM/50ML-% IV SOLN
1.0000 g | INTRAVENOUS | Status: AC
  Administered 2023-09-16: 1 g via INTRAVENOUS

## 2023-09-16 MED ORDER — MIDAZOLAM HCL 2 MG/2ML IJ SOLN
INTRAMUSCULAR | Status: DC | PRN
  Administered 2023-09-16: 2 mg via INTRAVENOUS
  Administered 2023-09-16: .5 mg via INTRAVENOUS

## 2023-09-16 MED ORDER — FENTANYL CITRATE (PF) 100 MCG/2ML IJ SOLN
INTRAMUSCULAR | Status: AC
  Filled 2023-09-16: qty 2

## 2023-09-16 MED ORDER — HEPARIN (PORCINE) IN NACL 1000-0.9 UT/500ML-% IV SOLN
INTRAVENOUS | Status: DC | PRN
  Administered 2023-09-16: 500 mL

## 2023-09-16 MED ORDER — LIDOCAINE HCL (PF) 1 % IJ SOLN
INTRAMUSCULAR | Status: DC | PRN
  Administered 2023-09-16: 5 mL via INTRADERMAL

## 2023-09-16 MED ORDER — HEPARIN SODIUM (PORCINE) 1000 UNIT/ML IJ SOLN
INTRAMUSCULAR | Status: AC
  Filled 2023-09-16: qty 10

## 2023-09-16 MED ORDER — IODIXANOL 320 MG/ML IV SOLN
INTRAVENOUS | Status: DC | PRN
  Administered 2023-09-16: 15 mL via INTRAVENOUS

## 2023-09-16 MED ORDER — MIDAZOLAM HCL 2 MG/ML PO SYRP: 8.0000 mg | ORAL_SOLUTION | Freq: Once | ORAL | Status: DC | PRN

## 2023-09-16 MED ORDER — METHYLPREDNISOLONE SODIUM SUCC 125 MG IJ SOLR: 125.0000 mg | Freq: Once | INTRAMUSCULAR | Status: DC | PRN

## 2023-09-16 MED ORDER — FENTANYL CITRATE (PF) 100 MCG/2ML IJ SOLN
INTRAMUSCULAR | Status: DC | PRN
  Administered 2023-09-16: 75 ug via INTRAVENOUS
  Administered 2023-09-16: 25 ug via INTRAVENOUS

## 2023-09-16 MED ORDER — HYDROMORPHONE HCL 1 MG/ML IJ SOLN: 1.0000 mg | Freq: Once | INTRAMUSCULAR | Status: DC | PRN

## 2023-09-16 MED ORDER — FENTANYL CITRATE PF 50 MCG/ML IJ SOSY
PREFILLED_SYRINGE | INTRAMUSCULAR | Status: AC
  Filled 2023-09-16: qty 1

## 2023-09-16 MED ORDER — FAMOTIDINE 20 MG PO TABS: 40.0000 mg | ORAL_TABLET | Freq: Once | ORAL | Status: DC | PRN

## 2023-09-16 MED ORDER — CEFAZOLIN SODIUM-DEXTROSE 1-4 GM/50ML-% IV SOLN
INTRAVENOUS | Status: AC
Start: 2023-09-16 — End: ?
  Filled 2023-09-16: qty 50

## 2023-09-16 MED ORDER — ONDANSETRON HCL 4 MG/2ML IJ SOLN: 4.0000 mg | Freq: Four times a day (QID) | INTRAMUSCULAR | Status: DC | PRN

## 2023-09-16 MED ORDER — MIDAZOLAM HCL 2 MG/2ML IJ SOLN
INTRAMUSCULAR | Status: AC
  Filled 2023-09-16: qty 4

## 2023-09-16 MED ORDER — SODIUM CHLORIDE 0.9 % IV SOLN: INTRAVENOUS | Status: DC

## 2023-09-16 MED ORDER — DIPHENHYDRAMINE HCL 50 MG/ML IJ SOLN: 50.0000 mg | Freq: Once | INTRAMUSCULAR | Status: DC | PRN

## 2023-09-16 SURGICAL SUPPLY — 19 items
BALLN ARMADA 5X40X135 (BALLOONS) ×1
BALLN LUTONIX DCB 4X80X130 (BALLOONS) ×1
BALLN LUTONIX DCB 5X80X130 (BALLOONS) ×1
BALLOON ARMADA 5X40X135 (BALLOONS) IMPLANT
BALLOON LUTONIX DCB 4X80X130 (BALLOONS) IMPLANT
BALLOON LUTONIX DCB 5X80X130 (BALLOONS) IMPLANT
CANNULA 5F STIFF (CANNULA) IMPLANT
CATH BEACON 5 .035 40 KMP TP (CATHETERS) IMPLANT
COVER PROBE ULTRASOUND 5X96 (MISCELLANEOUS) IMPLANT
DEVICE PRESTO INFLATION (MISCELLANEOUS) IMPLANT
DRAPE BRACHIAL (DRAPES) IMPLANT
GOWN STRL REUS W/ TWL LRG LVL3 (GOWN DISPOSABLE) ×1 IMPLANT
GUIDEWIRE ANGLED .035 180CM (WIRE) IMPLANT
GUIDEWIRE VERSACORE 260 (WIRE) IMPLANT
PACK ANGIOGRAPHY (CUSTOM PROCEDURE TRAY) ×1 IMPLANT
SHEATH BRITE TIP 6FRX5.5 (SHEATH) IMPLANT
STENT VIABAHN 6X25X120 (Permanent Stent) IMPLANT
SUT MNCRL AB 4-0 PS2 18 (SUTURE) IMPLANT
WIRE G V18X300CM (WIRE) IMPLANT

## 2023-09-16 NOTE — Interval H&P Note (Signed)
History and Physical Interval Note:  09/16/2023 2:31 PM  [name]  has presented today for surgery, with the diagnosis of L arm fistulagram   End Stage Renal.  The various methods of treatment have been discussed with the patient and family. After consideration of risks, benefits and other options for treatment, the patient has consented to  Procedure(s): A/V Fistulagram (Left) as a surgical intervention.  The patient's history has been reviewed, patient examined, no change in status, stable for surgery.  I have reviewed the patient's chart and labs.  Questions were answered to the patient's satisfaction.     [name]

## 2023-09-16 NOTE — Op Note (Signed)
OPERATIVE NOTE   PROCEDURE: Contrast injection left forearm loop AV access Percutaneous transluminal angioplasty and stent placement at the arterial anastomosis of the left forearm loop graft  PRE-OPERATIVE DIAGNOSIS: Complication of dialysis access                                                       End Stage Renal Disease  POST-OPERATIVE DIAGNOSIS: same as above   SURGEON: Renford Dills, M.D.  ANESTHESIA: Conscious sedation was administered under my direct supervision by the interventional radiology RN. IV Versed plus fentanyl were utilized. Continuous ECG, pulse oximetry and blood pressure was monitored throughout the entire procedure.  Conscious sedation was for a total of 38.  ESTIMATED BLOOD LOSS: minimal  FINDING(S): Stricture of the AV graft  SPECIMEN(S):  None  CONTRAST: 15 cc  FLUOROSCOPY TIME: 3.1 minutes  INDICATIONS: [name] is a 53 y.o. male who  presents with malfunctioning left forearm loop AV access.  The patient is scheduled for angiography with possible intervention of the AV access.  The patient is aware the risks include but are not limited to: bleeding, infection, thrombosis of the cannulated access, and possible anaphylactic reaction to the contrast.  The patient acknowledges if the access can not be salvaged a tunneled catheter will be needed and will be placed during this procedure.  The patient is aware of the risks of the procedure and elects to proceed with the angiogram and intervention.  DESCRIPTION: After full informed written consent was obtained, the patient was brought back to the Special Procedure suite and placed supine position.  Appropriate cardiopulmonary monitors were placed.  The left arm was prepped and draped in the standard fashion.  Appropriate timeout is called. The left forearm loop graft was cannulated with a micropuncture needle in a retrograde direction at the level just distal to the venous anastomosis.  Cannulation was  performed with ultrasound guidance. Ultrasound was placed in a sterile sleeve, the AV access was interrogated and noted to be echolucent and compressible indicating patency. Image was recorded for the permanent record. The puncture is performed under continuous ultrasound visualization.   The microwire was advanced and the needle was exchanged for  a microsheath.  The J-wire was then advanced and a 6 Fr sheath inserted.  A floppy Glidewire and Kumpe catheter were then negotiated into the distal brachial artery at the level of the antecubital fossa and hand injections of contrast were used to image the access from the distal brachial artery and arterial anastomosis through the entire access.  Interpretation: The visualized portions of the brachial artery are widely patent.  At the level of the arterial anastomosis of the loop graft there is a greater than 90% stenosis that extends over a distance of 15 to 20 mm.  There is absence of flow in the artery distal to the anastomosis.  Previously placed stents within the loop graft are all noted.  There are no hemodynamically significant stenoses or lesions identified beyond this proximal lesion.  The remainder of the graft is widely patent.  Based on the images,  4000 units of heparin was given and a wire was negotiated through the strictures within the arterial portion of the graft.  Initially a 4 mm x 60 mm Lutonix drug-eluting balloon was used across the anastomosis.  Inflation was to  10 atm for 1 minute.  This balloon was clearly slightly undersized and therefore a 5 mm x 60 mm Lutonix drug-eluting balloon was advanced through the arterial anastomosis such that the balloon extended into the brachial artery by 10 to 15 mm through the arterial anastomosis and then into the graft.  Inflation was to 12 atm for 1 minute.  Follow-up imaging by advancing the Kumpe over the wire into the brachial artery and then performing hand-injection demonstrated persisting greater  than 50% residual stenosis and therefore a V18 0.018 wire was advanced through the Kumpe catheter catheter was removed and a Viabahn 6 mm x 2.5 cm stent was advanced across the lesion at the arterial anastomosis it was deployed without difficulty and then postdilated with a 5 mm x 40 mm Ultraverse balloon inflated to 16 atm for 30 seconds.  Follow-up imaging by position the Kumpe and the brachial artery demonstrated wide patency with less than 10% residual stenosis there is now a dramatic improvement in flow through the loop graft.  No other lesions are identified.  Follow-up imaging demonstrates complete resolution of the stricture with rapid flow of contrast through the graft, the central venous anatomy is preserved.  A 4-0 Monocryl purse-string suture was sewn around the sheath.  The sheath was removed and light pressure was applied.  A sterile bandage was applied to the puncture site.    COMPLICATIONS: None  CONDITION: [name], M.D [facility] Office: [phone]  09/16/2023 4:00 PM

## 2023-09-16 NOTE — Discharge Instructions (Signed)
**Note -Identified via Obfuscation** Shuntogram, Care After Refer to this sheet in the next few weeks. These instructions provide you with information on caring for yourself after your procedure. Your health care provider may also give you more specific instructions. Your treatment has been planned according to current medical practices, but problems sometimes occur. Call your health care provider if you have any problems or questions after your procedure. What can I expect after the procedure? After your procedure, it is typical to have the following:  A small amount of discomfort in the area where the catheters were placed.  A small amount of bruising around the fistula.  Sleepiness and fatigue.  Follow these instructions at home:  Rest at home for the day following your procedure.  Do not drive or operate heavy machinery while taking pain medicine.  Take medicines only as directed by your health care provider.  Do not take baths, swim, or use a hot tub until your health care provider approves. You may shower 24 hours after the procedure or as directed by your health care provider.  There are many different ways to close and cover an incision, including stitches, skin glue, and adhesive strips. Follow your health care provider's instructions on: ? Incision care. ? Bandage (dressing) changes and removal. ? Incision closure removal.  Monitor your dialysis fistula carefully. Contact a health care provider if:  You have drainage, redness, swelling, or pain at your catheter site.  You have a fever.  You have chills. Get help right away if:  You feel weak.  You have trouble balancing.  You have trouble moving your arms or legs.  You have problems with your speech or vision.  You can no longer feel a vibration or buzz when you put your fingers over your dialysis fistula.  The limb that was used for the procedure: ? Swells. ? Is painful. ? Is cold. ? Is discolored, such as blue or pale white. This  information is not intended to replace advice given to you by your health care provider. Make sure you discuss any questions you have with your health care provider. Document Released: 12/31/2013 Document Revised: 01/22/2016 Document Reviewed: 10/05/2013 Elsevier Interactive Patient Education  2018 [employer].

## 2023-09-17 DIAGNOSIS — N186 End stage renal disease: Secondary | ICD-10-CM | POA: Diagnosis not present

## 2023-09-17 DIAGNOSIS — E8779 Other fluid overload: Secondary | ICD-10-CM | POA: Diagnosis not present

## 2023-09-17 DIAGNOSIS — Z992 Dependence on renal dialysis: Secondary | ICD-10-CM | POA: Diagnosis not present

## 2023-09-19 ENCOUNTER — Encounter: Payer: Medicare HMO | Admitting: Physician Assistant

## 2023-09-19 ENCOUNTER — Encounter: Payer: Self-pay | Admitting: Vascular Surgery

## 2023-09-19 DIAGNOSIS — I87321 Chronic venous hypertension (idiopathic) with inflammation of right lower extremity: Secondary | ICD-10-CM | POA: Diagnosis not present

## 2023-09-19 DIAGNOSIS — I890 Lymphedema, not elsewhere classified: Secondary | ICD-10-CM | POA: Diagnosis not present

## 2023-09-19 DIAGNOSIS — Z992 Dependence on renal dialysis: Secondary | ICD-10-CM | POA: Diagnosis not present

## 2023-09-19 DIAGNOSIS — I87312 Chronic venous hypertension (idiopathic) with ulcer of left lower extremity: Secondary | ICD-10-CM | POA: Diagnosis not present

## 2023-09-19 DIAGNOSIS — L97822 Non-pressure chronic ulcer of other part of left lower leg with fat layer exposed: Secondary | ICD-10-CM | POA: Diagnosis not present

## 2023-09-19 DIAGNOSIS — E11622 Type 2 diabetes mellitus with other skin ulcer: Secondary | ICD-10-CM | POA: Diagnosis not present

## 2023-09-19 DIAGNOSIS — N186 End stage renal disease: Secondary | ICD-10-CM | POA: Diagnosis not present

## 2023-09-19 DIAGNOSIS — E1122 Type 2 diabetes mellitus with diabetic chronic kidney disease: Secondary | ICD-10-CM | POA: Diagnosis not present

## 2023-09-19 NOTE — Progress Notes (Addendum)
LEARY, BABAYEV [name] ([id number]) [id number] Page 1 of 6 Visit Report for 09/19/2023 Chief Complaint Document Details Patient Name: Date of Service: [name] 09/19/2023 11:00 [name] Medical Record Number: [id number] Patient Account Number: [id number] Date of Birth/Sex: Treating RN: 1971/04/15 (53 y.o. [name]) [name] Primary Care Provider: [name] Other Clinician: Referring Provider: Treating Provider/Extender: [name] in Treatment: 30 Information Obtained from: Patient Chief Complaint Left LE Ulcer Electronic Signature(s) Signed: 09/19/2023 11:03:45 AM By: Allen Derry PA-C Entered By: Allen Derry on 09/19/2023 11:03:44 -------------------------------------------------------------------------------- HPI Details Patient Name: Date of Service: [name] 09/19/2023 11:00 [name] Medical Record Number: [id number] Patient Account Number: [id number] Date of Birth/Sex: Treating RN: 1971-07-12 (53 y.o. [name] Primary Care Provider: [name] Other Clinician: Referring Provider: Treating Provider/Extender: [name] in Treatment: 30 History of Present Illness HPI Description: Readmission: 02/16/2023 [name] is [name] 53 year old male with [name] past medical history of end-stage renal disease on hemodialysis, Type 2 diabetes, And lymphedema/venous insufficiency That presents to the clinic for evaluation of his right lower extremity. He has wounds to his left lower extremity for several months but these are being managed by vein and vascular with [name] wound VAC. On 02/04/2023 patient was evaluated for new blisters to the right lower extremity and potential cellulitis at vein and vascular and was recommended to go to the ED. He was started on oral antibiotics by the ED and referred to our clinic for follow-up to assure that the right leg wounds improved. He had an Unna boot placed by vein and  vascular two days prior. He has home health that changes the unna boot and wound vac 2 times weekly. He currently denies signs of infection. There are no open wounds to the right lower extremity. 09-19-23 upon evaluation today patient appears to be doing well currently in regard to his wound. He actually appears to be still closed with regard to his legs and I feel like that he is doing excellent in that regard. Fortunately I do not see any signs of infection currently which is great news. Electronic Signature(s) Signed: 09/19/2023 2:08:15 PM By: Stan Head, [name] ([id number]) [id number] Page 2 of 6 Entered By: Allen Derry on 09/19/2023 14:08:15 -------------------------------------------------------------------------------- Physical Exam Details Patient Name: Date of Service: [name] 09/19/2023 11:00 [name] Medical Record Number: [id number] Patient Account Number: [id number] Date of Birth/Sex: Treating RN: 1970-09-12 (53 y.o. [name] Primary Care Provider: [name] Other Clinician: Referring Provider: Treating Provider/Extender: [name] in Treatment: 30 Constitutional Obese and well-hydrated in no acute distress. Respiratory normal breathing without difficulty. Psychiatric this patient is able to make decisions and demonstrates good insight into disease process. Alert and Oriented x 3. pleasant and cooperative. Notes Patient's wound bed actually showed signs again of being completely closed there is no signs of weeping he is using his pumps although he is not sure they are fitting quite right. Nonetheless he also tells me has been using his compression. Electronic Signature(s) Signed: 09/19/2023 2:08:27 PM By: Allen Derry PA-C Entered By: Allen Derry on 09/19/2023 14:08:27 -------------------------------------------------------------------------------- Physician Orders Details Patient Name: Date  of Service: [name] 09/19/2023 11:00 [name] Medical Record Number: [id number] Patient Account Number: [id number] Date of Birth/Sex: Treating RN: [birth year] (53 y.o. [name] Primary Care Provider: [name] Other Clinician: Referring Provider: Treating Provider/Extender:  Stone, Laurel Smeltz [name] Weeks in Treatment: 30 The following information was scribed by: [name] The information was scribed for: Allen Derry Verbal / Phone Orders: No Diagnosis Coding ICD-10 Coding Code Description [phone] Non-pressure chronic ulcer of other part of left lower leg with fat layer exposed I87.312 Chronic venous hypertension (idiopathic) with ulcer of left lower extremity I89.0 Lymphedema, not elsewhere classified [name], [name] ([id number]) [id number] Page 3 of 6 I87.321 Chronic venous hypertension (idiopathic) with inflammation of right lower extremity E11.622 Type 2 diabetes mellitus with other skin ulcer Follow-up Appointments Return Appointment in 1 week. Edema Control - Orders / Instructions Tubigrip double layer applied - left leg Patient to wear own compression stockings. Remove compression stockings every night before going to bed and put on every morning when getting up. Elevate, Exercise Daily and [name] void Standing for Long Periods of Time. Patient to wear own Velcro compression garment. Remove compression stockings every night before going to bed and put on every morning when getting up. Elevate legs to the level of the heart and pump ankles as often as possible - left and right Elevate leg(s) parallel to the floor when sitting. Compression Pump: Use compression pump on left lower extremity for 60 minutes, twice daily. Compression Pump: Use compression pump on right lower extremity for 60 minutes, twice daily. Electronic Signature(s) Signed: 09/19/2023 11:44:25 AM By: [name] RN Signed: 09/19/2023 5:18:10 PM By: Allen Derry  PA-C Entered By: [name] on 09/19/2023 11:44:24 -------------------------------------------------------------------------------- Problem List Details Patient Name: Date of Service: [name] 09/19/2023 11:00 [name] Medical Record Number: [id number] Patient Account Number: [id number] Date of Birth/Sex: Treating RN: 1971-04-01 (53 y.o. [name] Primary Care Provider: [name] Other Clinician: Referring Provider: Treating Provider/Extender: [name] in Treatment: 30 Active Problems ICD-10 Encounter Code Description Active Date MDM Diagnosis [phone] Non-pressure chronic ulcer of other part of left lower leg with fat layer exposed6/19/2024 No Yes I87.312 Chronic venous hypertension (idiopathic) with ulcer of left lower extremity 02/16/2023 No Yes I89.0 Lymphedema, not elsewhere classified 03/23/2023 No Yes I87.321 Chronic venous hypertension (idiopathic) with inflammation of right lower 02/16/2023 No Yes extremity E11.622 Type 2 diabetes mellitus with other skin ulcer 02/16/2023 No Yes Inactive Problems ICD-10 [place], [name] ([id number]) [id number] Page 4 of 6 Code Description Active Date Inactive Date L97.522 Non-pressure chronic ulcer of other part of left foot with fat layer exposed 03/16/2023 03/16/2023 Resolved Problems Electronic Signature(s) Signed: 09/19/2023 11:03:40 AM By: Allen Derry PA-C Entered By: Allen Derry on 09/19/2023 11:03:40 -------------------------------------------------------------------------------- Progress Note Details Patient Name: Date of Service: [name] 09/19/2023 11:00 [name] Medical Record Number: [id number] Patient Account Number: [id number] Date of Birth/Sex: Treating RN: Dec 20, 1970 (53 y.o. [name] Primary Care Provider: [name] Other Clinician: Referring Provider: Treating Provider/Extender: [name] in Treatment:  30 Subjective Chief Complaint Information obtained from Patient Left LE Ulcer History of Present Illness (HPI) Readmission: 02/16/2023 [name] is [name] 53 year old male with [name] past medical history of end-stage renal disease on hemodialysis, Type 2 diabetes, And lymphedema/venous insufficiency That presents to the clinic for evaluation of his right lower extremity. He has wounds to his left lower extremity for several months but these are being managed by vein and vascular with [name] wound VAC. On 02/04/2023 patient was evaluated for new blisters to the right lower extremity and potential cellulitis at vein and vascular and was recommended to go to  the ED. He was started on oral antibiotics by the ED and referred to our clinic for follow-up to assure that the right leg wounds improved. He had an Unna boot placed by vein and vascular two days prior. He has home health that changes the unna boot and wound vac 2 times weekly. He currently denies signs of infection. There are no open wounds to the right lower extremity. 09-19-23 upon evaluation today patient appears to be doing well currently in regard to his wound. He actually appears to be still closed with regard to his legs and I feel like that he is doing excellent in that regard. Fortunately I do not see any signs of infection currently which is great news. Objective Constitutional Obese and well-hydrated in no acute distress. Vitals Time Taken: 11:00 AM, Height: 76 in, Weight: 400 lbs, BMI: 48.7, Temperature: 97.5 F, Pulse: 76 bpm, Respiratory Rate: 18 breaths/min, Blood Pressure: 162/81 mmHg. Respiratory normal breathing without difficulty. Psychiatric this patient is able to make decisions and demonstrates good insight into disease process. Alert and Oriented x 3. pleasant and cooperative. General Notes: Patient's wound bed actually showed signs again of being completely closed there is no signs of weeping he is using his pumps although he  is not sure they are fitting quite right. Nonetheless he also tells me has been using his compression. [name], [name] ([id number]) [id number] Page 5 of 6 Assessment Active Problems ICD-10 Non-pressure chronic ulcer of other part of left lower leg with fat layer exposed Chronic venous hypertension (idiopathic) with ulcer of left lower extremity Lymphedema, not elsewhere classified Chronic venous hypertension (idiopathic) with inflammation of right lower extremity Type 2 diabetes mellitus with other skin ulcer Plan Follow-up Appointments: Return Appointment in 1 week. Edema Control - Orders / Instructions: Tubigrip double layer applied - left leg Patient to wear own compression stockings. Remove compression stockings every night before going to bed and put on every morning when getting up. Elevate, Exercise Daily and Avoid Standing for Long Periods of Time. Patient to wear own Velcro compression garment. Remove compression stockings every night before going to bed and put on every morning when getting up. Elevate legs to the level of the heart and pump ankles as often as possible - left and right Elevate leg(s) parallel to the floor when sitting. Compression Pump: Use compression pump on left lower extremity for 60 minutes, twice daily. Compression Pump: Use compression pump on right lower extremity for 60 minutes, twice daily. 1. I would recommend that we have the patient continue with his compression wraps, the Tubigrip, the lymphedema pumps, and elevation. The more of this that he can do the better in my opinion. 2. I am going to recommend as well that the patient should continue to monitor for any signs of infection or worsening if anything changes he knows contact the office and let me know. We will see patient back for reevaluation in 1 week here in the clinic. If anything worsens or changes patient will contact our office for  additional recommendations. Electronic Signature(s) Signed: 09/19/2023 2:08:53 PM By: Allen Derry PA-C Entered By: Allen Derry on 09/19/2023 14:08:53 -------------------------------------------------------------------------------- SuperBill Details Patient Name: Date of Service: [name] 09/19/2023 Medical Record Number: [id number] Patient Account Number: [id number] Date of Birth/Sex: Treating RN: August 18, 1971 (53 y.o. [name] Primary Care Provider: [name] Other Clinician: Referring Provider: Treating Provider/Extender: [name] in Treatment: 30 Diagnosis Coding ICD-10 Codes Code Description [phone] Non-pressure  chronic ulcer of other part of left lower leg with fat layer exposed I87.312 Chronic venous hypertension (idiopathic) with ulcer of left lower extremity I89.0 Lymphedema, not elsewhere classified I87.321 Chronic venous hypertension (idiopathic) with inflammation of right lower extremity [place], [name] ([id number]) [id number] Page 6 of 6 E11.622 Type 2 diabetes mellitus with other skin ulcer Facility Procedures : CPT4 Code: [id number] Description: [id number] - WOUND CARE VISIT-LEV 2 EST PT Modifier: Quantity: 1 Physician Procedures : CPT4 Code Description Modifier [id number] - WC PHYS LEVEL 3 - EST PT ICD-10 Diagnosis Description L97.822 Non-pressure chronic ulcer of other part of left lower leg with fat layer exposed I87.312 Chronic venous hypertension (idiopathic) with ulcer  of left lower extremity I89.0 Lymphedema, not elsewhere classified I87.321 Chronic venous hypertension (idiopathic) with inflammation of right lower extremity Quantity: 1 Electronic Signature(s) Signed: 09/19/2023 2:09:16 PM By: Allen Derry PA-C Previous Signature: 09/19/2023 11:45:00 AM Version By: [name] RN Entered By: Allen Derry on 09/19/2023 14:09:15

## 2023-09-20 DIAGNOSIS — E8779 Other fluid overload: Secondary | ICD-10-CM | POA: Diagnosis not present

## 2023-09-20 DIAGNOSIS — N186 End stage renal disease: Secondary | ICD-10-CM | POA: Diagnosis not present

## 2023-09-20 DIAGNOSIS — Z992 Dependence on renal dialysis: Secondary | ICD-10-CM | POA: Diagnosis not present

## 2023-09-20 NOTE — Progress Notes (Signed)
RYELAN, LEVENGOOD A ([id number]) [id number] Page 1 of 7 Visit Report for 09/19/2023 Arrival Information Details Patient Name: Date of Service: [name], CO [place] W A. 09/19/2023 11:00 A M Medical Record Number: [id number] Patient Account Number: [id number] Date of Birth/Sex: Treating RN: 10/06/1970 (53 y.o. [name]) [name] Primary Care Pierra Skora: [name] Other Clinician: Referring Giann Obara: Treating Salah Nakamura/Extender: [name] in Treatment: 30 Visit Information History Since Last Visit Added or deleted any medications: No Patient Arrived: Ambulatory Any new allergies or adverse reactions: No Arrival Time: 11:01 Had a fall or experienced change in No Accompanied By: self activities of daily living that may affect Transfer Assistance: None risk of falls: Patient Identification Verified: Yes Signs or symptoms of abuse/neglect since last visito No Secondary Verification Process Completed: Yes Hospitalized since last visit: No Patient Requires Transmission-Based Precautions: No Implantable device outside of the clinic excluding No Patient Has Alerts: Yes cellular tissue based products placed in the center Patient Alerts: Patient on Blood Thinner since last visit: ABI R [place] TBI .61 10/11/22 Has Dressing in Place as Prescribed: Yes ABI L [place] TBI .33 10/11/22 Has Compression in Place as Prescribed: Yes Pain Present Now: No Electronic Signature(s) Signed: 09/20/2023 9:06:19 AM By: [name] RN Entered By: [name] on 09/19/2023 11:01:40 -------------------------------------------------------------------------------- Clinic Level of Care Assessment Details Patient Name: Date of Service: [name], [name] 09/19/2023 11:00 A M Medical Record Number: [id number] Patient Account Number: [id number] Date of Birth/Sex: Treating RN: [birth year] (52 y.o. [name] Primary Care Toshiye Kever: [name] Other Clinician: Referring  Mehki Klumpp: Treating Telly Broberg/Extender: [name] in Treatment: 30 Clinic Level of Care Assessment Items TOOL 4 Quantity Score X- 1 0 Use when only an EandM is performed on FOLLOW-UP visit ASSESSMENTS - Nursing Assessment / Reassessment X- 1 10 Reassessment of Co-morbidities (includes updates in patient status) X- 1 5 Reassessment of Adherence to Treatment Plan Maggio, Sarkis A ([id number]) [phone] Page 2 of 7 ASSESSMENTS - Wound and Skin A ssessment / Reassessment []  - 0 Simple Wound Assessment / Reassessment - one wound []  - 0 Complex Wound Assessment / Reassessment - multiple wounds []  - 0 Dermatologic / Skin Assessment (not related to wound area) ASSESSMENTS - Focused Assessment []  - 0 Circumferential Edema Measurements - multi extremities []  - 0 Nutritional Assessment / Counseling / Intervention []  - 0 Lower Extremity Assessment (monofilament, tuning fork, pulses) []  - 0 Peripheral Arterial Disease Assessment (using hand held doppler) ASSESSMENTS - Ostomy and/or Continence Assessment and Care []  - 0 Incontinence Assessment and Management []  - 0 Ostomy Care Assessment and Management (repouching, etc.) PROCESS - Coordination of Care X - Simple Patient / Family Education for ongoing care 1 15 []  - 0 Complex (extensive) Patient / Family Education for ongoing care []  - 0 Staff obtains Chiropractor, Records, T Results / Process Orders est []  - 0 Staff telephones HHA, Nursing Homes / Clarify orders / etc []  - 0 Routine Transfer to another Facility (non-emergent condition) []  - 0 Routine Hospital Admission (non-emergent condition) []  - 0 New Admissions / Manufacturing engineer / Ordering NPWT Apligraf, etc. , []  - 0 Emergency Hospital Admission (emergent condition) X- 1 10 Simple Discharge Coordination []  - 0 Complex (extensive) Discharge Coordination PROCESS - Special Needs []  - 0 Pediatric / Minor Patient  Management []  - 0 Isolation Patient Management []  - 0 Hearing / Language / Visual special needs []  - 0 Assessment of Community assistance (transportation, D/C planning, etc.) []  -  0 Additional assistance / Altered mentation []  - 0 Support Surface(s) Assessment (bed, cushion, seat, etc.) INTERVENTIONS - Wound Cleansing / Measurement []  - 0 Simple Wound Cleansing - one wound []  - 0 Complex Wound Cleansing - multiple wounds []  - 0 Wound Imaging (photographs - any number of wounds) []  - 0 Wound Tracing (instead of photographs) []  - 0 Simple Wound Measurement - one wound []  - 0 Complex Wound Measurement - multiple wounds INTERVENTIONS - Wound Dressings []  - 0 Small Wound Dressing one or multiple wounds []  - 0 Medium Wound Dressing one or multiple wounds []  - 0 Large Wound Dressing one or multiple wounds []  - 0 Application of Medications - topical []  - 0 Application of Medications - injection INTERVENTIONS - Miscellaneous []  - 0 External ear exam Bezdek, Norah A ([id number]) [id number] Page 3 of 7 []  - 0 Specimen Collection (cultures, biopsies, blood, body fluids, etc.) []  - 0 Specimen(s) / Culture(s) sent or taken to Lab for analysis []  - 0 Patient Transfer (multiple staff / Michiel Sites Lift / Similar devices) []  - 0 Simple Staple / Suture removal (25 or less) []  - 0 Complex Staple / Suture removal (26 or more) []  - 0 Hypo / Hyperglycemic Management (close monitor of Blood Glucose) []  - 0 Ankle / Brachial Index (ABI) - do not check if billed separately X- 1 5 Vital Signs Has the patient been seen at the hospital within the last three years: Yes Total Score: 45 Level Of Care: New/Established - Level 2 Electronic Signature(s) Signed: 09/20/2023 9:06:19 AM By: [name] RN Entered By: [name] on 09/19/2023 11:44:54 -------------------------------------------------------------------------------- Encounter Discharge Information  Details Patient Name: Date of Service: [name], [name] 09/19/2023 11:00 A M Medical Record Number: [id number] Patient Account Number: [id number] Date of Birth/Sex: Treating RN: 10/27/1970 (52 y.o. [name] Primary Care Amyr Sluder: [name] Other Clinician: Referring Hawkin Charo: Treating Baylin Cabal/Extender: [name] in Treatment: 30 Encounter Discharge Information Items Discharge Condition: Stable Ambulatory Status: Ambulatory Discharge Destination: Home Transportation: Private Auto Accompanied By: self Schedule Follow-up Appointment: Yes Clinical Summary of Care: Electronic Signature(s) Signed: 09/19/2023 11:46:12 AM By: [name] RN Entered By: [name] on 09/19/2023 11:46:12 Lower Extremity Assessment Details -------------------------------------------------------------------------------- [name] ([id number]) [id number] Page 4 of 7 Patient Name: Date of Service: [name], [name] 09/19/2023 11:00 A M Medical Record Number: [id number] Patient Account Number: [id number] Date of Birth/Sex: Treating RN: 01-30-1971 (52 y.o. [name]) [name] Primary Care Olyvia Gopal: [name] Other Clinician: Referring Ayodeji [name]: Treating Lonya Johannesen/Extender: [name] in Treatment: 30 Edema Assessment Left: Right: Assessed: No No Edema: Yes Calf Left: Right: Point of Measurement: 40 cm From Medial Instep 49 cm Ankle Left: Right: Point of Measurement: 12 cm From Medial Instep 42 cm Knee To Floor Left: Right: From Medial Instep 55 cm Vascular Assessment Left: Right: Pulses: Dorsalis Pedis Palpable: Yes Extremity colors, hair growth, and conditions: Extremity Color: Hyperpigmented Hair Growth on Extremity: No Temperature of Extremity: Warm Capillary Refill: < 3 seconds Dependent Rubor: No Blanched when Elevated: No Lipodermatosclerosis: No Toe Nail Assessment Left: Right: Thick:  Yes Discolored: Yes Deformed: Yes Improper Length and Hygiene: Yes Electronic Signature(s) Signed: 09/20/2023 9:06:19 AM By: [name] RN Entered By: [name] on 09/19/2023 11:06:17 -------------------------------------------------------------------------------- Multi Wound Chart Details Patient Name: Date of Service: [name], [name] 09/19/2023 11:00 A M Medical Record Number: [id number] Patient Account Number: [id number] Date of Birth/Sex: Treating RN:  07-Jul-1971 (53 y.o. [name] Primary Care Ieesha Abbasi: [name] Other Clinician: Referring Marise Knapper: Treating Khristina Janota/Extender: [name] in Treatment: 30 Vital Signs Shane, Noah A ([id number]) [id number] Page 5 of 7 Height(in): 76 Pulse(bpm): 76 Weight(lbs): 400 Blood Pressure(mmHg): 162/81 Body Mass Index(BMI): 48.7 Temperature(F): 97.5 Respiratory Rate(breaths/min): 18 [Treatment Notes:Wound Assessments Treatment Notes] Electronic Signature(s) Signed: 09/19/2023 11:43:54 AM By: [name] RN Entered By: [name] on 09/19/2023 11:43:54 -------------------------------------------------------------------------------- Multi-Disciplinary Care Plan Details Patient Name: Date of Service: [name], [name] 09/19/2023 11:00 A M Medical Record Number: [id number] Patient Account Number: [id number] Date of Birth/Sex: Treating RN: Apr 14, 1971 (52 y.o. [name] Primary Care Immaculate Crutcher: [name] Other Clinician: Referring Jilberto Vanderwall: Treating Kamali Sakata/Extender: [name] in Treatment: 30 Active Inactive Electronic Signature(s) Signed: 09/19/2023 11:45:05 AM By: [name] RN Entered By: [name] on 09/19/2023 11:45:04 -------------------------------------------------------------------------------- Pain Assessment Details Patient Name: Date of Service: [name], [name] 09/19/2023 11:00 A M Medical Record Number:  [id number] Patient Account Number: [id number] Date of Birth/Sex: Treating RN: 01-23-1971 (52 y.o. [name] Primary Care Shamell Hittle: [name] Other Clinician: Referring Rogene Meth: Treating Ramiz Turpin/Extender: [name] in Treatment: 30 Active Problems Location of Pain Severity and Description of Pain Patient Has Paino No Site Locations Gambill, Kooper A ([id number]) [phone].pdf Page 6 of 7 Pain Management and Medication Current Pain Management: Electronic Signature(s) Signed: 09/20/2023 9:06:19 AM By: [name] RN Entered By: [name] on 09/19/2023 11:02:23 -------------------------------------------------------------------------------- Patient/Caregiver Education Details Patient Name: Date of Service: [name], [name] 1/20/2025andnbsp11:00 A M Medical Record Number: [id number] Patient Account Number: [id number] Date of Birth/Gender: Treating RN: Jan 09, 1971 (52 y.o. [name] Primary Care Physician: [name] Other Clinician: Referring Physician: Treating Physician/Extender: [name] in Treatment: 30 Education Assessment Education Provided To: Patient Education Topics Provided Wound/Skin Impairment: Handouts: Other: compression Methods: Explain/Verbal Electronic Signature(s) Signed: 09/20/2023 9:06:19 AM By: [name] RN Entered By: [name] on 09/19/2023 11:45:23 Rosemond, Nashaun A ([id number]) [id number] Page 7 of 7 -------------------------------------------------------------------------------- Vitals Details Patient Name: Date of Service: [name], [name] 09/19/2023 11:00 A M Medical Record Number: [id number] Patient Account Number: [id number] Date of Birth/Sex: Treating RN: 09-10-1970 (52 y.o. [name]) [name] Primary Care Tanyika Barros: [name] Other Clinician: Referring Iran Kievit: Treating Lasonia Casino/Extender: [name] in Treatment: 30 Vital Signs Time Taken: 11:00 Temperature (F): 97.5 Height (in): 76 Pulse (bpm): 76 Weight (lbs): 400 Respiratory Rate (breaths/min): 18 Body Mass Index (BMI): 48.7 Blood Pressure (mmHg): 162/81 Reference Range: 80 - 120 mg / dl Electronic Signature(s) Signed: 09/20/2023 9:06:19 AM By: [name] RN Entered By: [name] on 09/19/2023 11:02:11

## 2023-09-22 DIAGNOSIS — N186 End stage renal disease: Secondary | ICD-10-CM | POA: Diagnosis not present

## 2023-09-22 DIAGNOSIS — E8779 Other fluid overload: Secondary | ICD-10-CM | POA: Diagnosis not present

## 2023-09-22 DIAGNOSIS — Z992 Dependence on renal dialysis: Secondary | ICD-10-CM | POA: Diagnosis not present

## 2023-09-24 DIAGNOSIS — E8779 Other fluid overload: Secondary | ICD-10-CM | POA: Diagnosis not present

## 2023-09-24 DIAGNOSIS — Z992 Dependence on renal dialysis: Secondary | ICD-10-CM | POA: Diagnosis not present

## 2023-09-24 DIAGNOSIS — N186 End stage renal disease: Secondary | ICD-10-CM | POA: Diagnosis not present

## 2023-09-26 ENCOUNTER — Encounter: Payer: Medicare HMO | Admitting: Physician Assistant

## 2023-09-26 DIAGNOSIS — N186 End stage renal disease: Secondary | ICD-10-CM | POA: Diagnosis not present

## 2023-09-26 DIAGNOSIS — I890 Lymphedema, not elsewhere classified: Secondary | ICD-10-CM | POA: Diagnosis not present

## 2023-09-26 DIAGNOSIS — E1122 Type 2 diabetes mellitus with diabetic chronic kidney disease: Secondary | ICD-10-CM | POA: Diagnosis not present

## 2023-09-26 DIAGNOSIS — E11622 Type 2 diabetes mellitus with other skin ulcer: Secondary | ICD-10-CM | POA: Diagnosis not present

## 2023-09-26 DIAGNOSIS — I87312 Chronic venous hypertension (idiopathic) with ulcer of left lower extremity: Secondary | ICD-10-CM | POA: Diagnosis not present

## 2023-09-26 DIAGNOSIS — Z992 Dependence on renal dialysis: Secondary | ICD-10-CM | POA: Diagnosis not present

## 2023-09-26 DIAGNOSIS — I87321 Chronic venous hypertension (idiopathic) with inflammation of right lower extremity: Secondary | ICD-10-CM | POA: Diagnosis not present

## 2023-09-26 DIAGNOSIS — L97822 Non-pressure chronic ulcer of other part of left lower leg with fat layer exposed: Secondary | ICD-10-CM | POA: Diagnosis not present

## 2023-09-27 DIAGNOSIS — Z79899 Other long term (current) drug therapy: Secondary | ICD-10-CM | POA: Diagnosis not present

## 2023-09-27 DIAGNOSIS — E119 Type 2 diabetes mellitus without complications: Secondary | ICD-10-CM | POA: Diagnosis not present

## 2023-09-27 DIAGNOSIS — Z992 Dependence on renal dialysis: Secondary | ICD-10-CM | POA: Diagnosis not present

## 2023-09-27 DIAGNOSIS — E8779 Other fluid overload: Secondary | ICD-10-CM | POA: Diagnosis not present

## 2023-09-27 DIAGNOSIS — N186 End stage renal disease: Secondary | ICD-10-CM | POA: Diagnosis not present

## 2023-09-29 DIAGNOSIS — E8779 Other fluid overload: Secondary | ICD-10-CM | POA: Diagnosis not present

## 2023-09-29 DIAGNOSIS — Z992 Dependence on renal dialysis: Secondary | ICD-10-CM | POA: Diagnosis not present

## 2023-09-29 DIAGNOSIS — N186 End stage renal disease: Secondary | ICD-10-CM | POA: Diagnosis not present

## 2023-09-30 DIAGNOSIS — N186 End stage renal disease: Secondary | ICD-10-CM | POA: Diagnosis not present

## 2023-09-30 DIAGNOSIS — Z992 Dependence on renal dialysis: Secondary | ICD-10-CM | POA: Diagnosis not present

## 2023-10-03 ENCOUNTER — Encounter: Payer: Medicare HMO | Attending: Physician Assistant | Admitting: Physician Assistant

## 2023-10-03 DIAGNOSIS — L97822 Non-pressure chronic ulcer of other part of left lower leg with fat layer exposed: Secondary | ICD-10-CM | POA: Diagnosis not present

## 2023-10-03 DIAGNOSIS — Z09 Encounter for follow-up examination after completed treatment for conditions other than malignant neoplasm: Secondary | ICD-10-CM | POA: Insufficient documentation

## 2023-10-03 DIAGNOSIS — I890 Lymphedema, not elsewhere classified: Secondary | ICD-10-CM | POA: Insufficient documentation

## 2023-10-03 DIAGNOSIS — I87312 Chronic venous hypertension (idiopathic) with ulcer of left lower extremity: Secondary | ICD-10-CM | POA: Diagnosis not present

## 2023-10-03 DIAGNOSIS — E11622 Type 2 diabetes mellitus with other skin ulcer: Secondary | ICD-10-CM | POA: Insufficient documentation

## 2023-10-03 DIAGNOSIS — I87321 Chronic venous hypertension (idiopathic) with inflammation of right lower extremity: Secondary | ICD-10-CM | POA: Insufficient documentation

## 2023-10-10 ENCOUNTER — Encounter: Payer: Medicare HMO | Admitting: Physician Assistant

## 2023-10-10 ENCOUNTER — Ambulatory Visit: Payer: Self-pay | Admitting: *Deleted

## 2023-10-10 DIAGNOSIS — Z09 Encounter for follow-up examination after completed treatment for conditions other than malignant neoplasm: Secondary | ICD-10-CM | POA: Diagnosis not present

## 2023-10-10 NOTE — Patient Instructions (Signed)
 Visit Information  Thank you for taking time to visit with me today. Please don't hesitate to contact me if I can be of assistance to you before our next scheduled telephone appointment.  Following are the goals we discussed today:  Continue blood sugar monitoring to keep blood sugars stable for better wound healing.  Keep leg wounds clean and dry, wrapping them according to wound clinic instructions.  Notify MD of signs/symptoms of infection.   Our next appointment is by telephone on 4/4  Please call the care guide team at [phone] if you need to cancel or reschedule your appointment.   Please call the Suicide and Crisis Lifeline: 988 call the [place]  National Suicide Prevention Lifeline: 250-088-0045 or TTY: 850-639-5483 TTY (531)779-1590) to talk to a trained counselor call 1-800-273-TALK (toll free, 24 hour hotline) call 911 if you are experiencing a Mental Health or Behavioral Health Crisis or need someone to talk to.  The patient verbalized understanding of instructions, educational materials, and care plan provided today and DECLINED offer to receive copy of patient instructions, educational materials, and care plan.   The patient has been provided with contact information for the care management team and has been advised to call with any health related questions or concerns.   Holland Lundborg, RN, MSN, CCM [facility], [facility] RN Care Coordinator Direct Dial : [phone] / Main [phone] Fax [phone] Email: [email or web address] Website: [email or web address]

## 2023-10-10 NOTE — Patient Outreach (Signed)
  Care Coordination   Follow Up Visit Note   10/10/2023 Name: [name] MRN: [id number] DOB: April 29, 1971  [name] is a 53 y.o. year old male who sees [name], Genetta Kenning, MD for primary care. I spoke with  [name] by phone today.  What matters to the patients health and wellness today?  Patient report he is doing well, remains active with wound clinic for care of leg wounds. Denies any urgent concerns, encouraged to contact this care manager with questions.      Goals Addressed             This Visit's Progress    Effective management of DM   On track    Care Coordination Interventions: Provided education to patient about basic DM disease process Reviewed medications with patient and discussed importance of medication adherence Counseled on importance of regular laboratory monitoring as prescribed Advised patient, providing education and rationale, to check cbg daily and record, calling provider for findings outside established parameters Report he is adherent to HD on Tuesdays, Thursdays, and Saturdays       Effective wound healing of leg wound   On track    Care Coordination Interventions: Evaluation of current treatment plan related to leg wound and patient's adherence to plan as established by provider Advised patient to call [facility] agency to discuss concerns for treatment (regarding availability of supplies) Reviewed scheduled/upcoming provider appointments including routine visits to wound care center Discussed plans with patient for ongoing care management follow up and provided patient with direct contact information for care management team         SDOH assessments and interventions completed:  No     Care Coordination Interventions:  Yes, provided   Interventions Today    Flowsheet Row Most Recent Value  Chronic Disease   Chronic disease during today's visit Diabetes, Other  [diabetic ulcer of the leg]  General Interventions   General  Interventions Discussed/Reviewed General Interventions Reviewed, Doctor Visits  Doctor Visits Discussed/Reviewed Doctor Visits Reviewed, Specialist  [Reviewed upcoming: Wound clinic 2/24, vascular 2/26, Endocrinology 4/2]  PCP/Specialist Visits Compliance with follow-up visit  [Wound clinic visit today, legs wrapped, report wounds continue to heal]  Education Interventions   Education Provided Provided Education  Provided Verbal Education On Blood Sugar Monitoring, Medication, When to see the doctor, Labs  [facility] reviewed, report taking insulin  as instructed, will continue to monitor blood sugars, usually range 130s. Discussed wound cleaning/compression wraps at least daily, denies signs of infection]  Labs Reviewed Hgb A1c  [Will have A1C rececked with next endocrinology visit]        Follow up plan: Follow up call scheduled for 4/4    Encounter Outcome:  Patient Visit Completed   Holland Lundborg, RN, MSN, CCM [place]  [facility], [facility] RN Care Coordinator Direct Dial : [phone] / Main [phone] Fax [phone] Email: [email or web address]: [email or web address]

## 2023-10-24 ENCOUNTER — Encounter: Payer: Medicare HMO | Admitting: Physician Assistant

## 2023-10-24 DIAGNOSIS — Z09 Encounter for follow-up examination after completed treatment for conditions other than malignant neoplasm: Secondary | ICD-10-CM | POA: Diagnosis not present

## 2023-10-25 ENCOUNTER — Other Ambulatory Visit (INDEPENDENT_AMBULATORY_CARE_PROVIDER_SITE_OTHER): Payer: Self-pay | Admitting: Vascular Surgery

## 2023-10-25 DIAGNOSIS — N186 End stage renal disease: Secondary | ICD-10-CM

## 2023-10-26 ENCOUNTER — Ambulatory Visit (INDEPENDENT_AMBULATORY_CARE_PROVIDER_SITE_OTHER): Payer: Medicare HMO | Admitting: Nurse Practitioner

## 2023-10-26 ENCOUNTER — Ambulatory Visit (INDEPENDENT_AMBULATORY_CARE_PROVIDER_SITE_OTHER): Payer: Medicare HMO

## 2023-10-26 ENCOUNTER — Encounter (INDEPENDENT_AMBULATORY_CARE_PROVIDER_SITE_OTHER): Payer: Self-pay | Admitting: Nurse Practitioner

## 2023-10-26 VITALS — BP 140/81 | HR 71 | Resp 16 | Wt 391.0 lb

## 2023-10-26 DIAGNOSIS — N186 End stage renal disease: Secondary | ICD-10-CM | POA: Diagnosis not present

## 2023-10-26 DIAGNOSIS — I890 Lymphedema, not elsewhere classified: Secondary | ICD-10-CM

## 2023-10-26 DIAGNOSIS — I10 Essential (primary) hypertension: Secondary | ICD-10-CM

## 2023-10-26 NOTE — Progress Notes (Signed)
 Subjective:    Patient ID: [name], male    DOB: Apr 21, 1971, 53 y.o.   MRN: [id number] Chief Complaint  Patient presents with   Follow-up    [facility] 1 month with HDA    The patient returns to the office for followup status post intervention of the dialysis access left forearm loop graft.   Following the intervention the excess function has improved somewhat per the patient.  The patient denies an increase in arm swelling. At the present time the patient denies hand pain.  No recent shortening of the patient's walking distance or new symptoms consistent with claudication.  No history of rest pain symptoms. No new ulcers or wounds of the lower extremities have occurred.  The patient denies amaurosis fugax or recent TIA symptoms. There are no recent neurological changes noted. There is no history of DVT, PE or superficial thrombophlebitis. No recent episodes of angina or shortness of breath documented.   Duplex ultrasound of the AV access shows a patent access.  The previously noted stenosis is improved but there is a additional stenosis noted near the arterial anastomosis.  Flow volume today is 444 cc/min (previous flow volume was 245 cc/min)    Review of Systems  Neurological:  Positive for numbness.  All other systems reviewed and are negative.      Objective:   Physical Exam Vitals reviewed.  HENT:     Head: Normocephalic.  Cardiovascular:     Rate and Rhythm: Normal rate.     Arteriovenous access: Left arteriovenous access is present.    Comments: Very soft thrill and bruit Pulmonary:     Effort: Pulmonary effort is normal.  Skin:    General: Skin is warm and dry.  Neurological:     Mental Status: He is alert and oriented to person, place, and time.  Psychiatric:        Mood and Affect: Mood normal.        Behavior: Behavior normal.        Thought Content: Thought content normal.        Judgment: Judgment normal.     BP (!) 140/81   Pulse 71   Resp 16    Wt (!) 391 lb (177.4 kg)   BMI 47.59 kg/m   Past Medical History:  Diagnosis Date   Anemia    Chronic kidney disease    14% FUNCTION   Diabetes mellitus without complication (HCC)    Diabetic retinopathy (HCC)    Dyspnea    DOE   Dysrhythmia    End stage renal disease (HCC)    History of orthopnea    error wrong patietn   Hypercholesteremia    error   Hyperkalemia    error wrong patient   Hypertension    Long-term insulin use (HCC)    Lymphedema of leg    Metabolic encephalopathy    error   Microalbuminuria    MSSA (methicillin susceptible Staphylococcus aureus)    Obesity    PVD (peripheral vascular disease) (HCC)    Respiratory failure (HCC)    Error   Sepsis (HCC)    error   Sleep apnea    no CPAP   UTI (urinary tract infection)    error   Venous ulcer (HCC)    Vitamin D deficiency     Social History   Socioeconomic History   Marital status: Divorced    Spouse name: Not on file   Number of children: Not on file  Years of education: Not on file   Highest education level: Not on file  Occupational History   Not on file  Tobacco Use   Smoking status: Former    Types: Cigars    Passive exposure: Past   Smokeless tobacco: Former    Quit date: 09/30/2017   Tobacco comments:    occasional  Vaping Use   Vaping status: Never Used  Substance and Sexual Activity   Alcohol use: Yes    Alcohol/week: 1.0 standard drink of alcohol    Types: 1 Cans of beer per week    Comment: beer   Drug use: Not Currently   Sexual activity: Yes  Other Topics Concern   Not on file  Social History Narrative   Lives alone.  Significant other, Debbie.    Social Drivers of Corporate investment banker Strain: Not on file  Food Insecurity: No Food Insecurity (03/24/2023)   Hunger Vital Sign    Worried About Running Out of Food in the Last Year: Never true    Ran Out of Food in the Last Year: Never true  Transportation Needs: No Transportation Needs (03/24/2023)   PRAPARE -  Administrator, Civil Service (Medical): No    Lack of Transportation (Non-Medical): No  Physical Activity: Not on file  Stress: Not on file  Social Connections: Not on file  Intimate Partner Violence: Not At Risk (03/24/2023)   Humiliation, Afraid, Rape, and Kick questionnaire    Fear of Current or Ex-Partner: No    Emotionally Abused: No    Physically Abused: No    Sexually Abused: No    Past Surgical History:  Procedure Laterality Date   A/V FISTULAGRAM Right 10/24/2018   Procedure: A/V FISTULAGRAM;  Surgeon: Renford Dills, MD;  Location: [facility];  Service: Cardiovascular;  Laterality: Right;   A/V FISTULAGRAM Right 11/24/2018   Procedure: A/V FISTULAGRAM;  Surgeon: Renford Dills, MD;  Location: [facility];  Service: Cardiovascular;  Laterality: Right;   A/V FISTULAGRAM Left 07/31/2019   Procedure: A/V FISTULAGRAM;  Surgeon: Renford Dills, MD;  Location: [facility];  Service: Cardiovascular;  Laterality: Left;   A/V FISTULAGRAM Left 12/11/2019   Procedure: A/V FISTULAGRAM;  Surgeon: Renford Dills, MD;  Location: [facility];  Service: Cardiovascular;  Laterality: Left;   A/V FISTULAGRAM Left 07/08/2023   Procedure: A/V Fistulagram;  Surgeon: Renford Dills, MD;  Location: [facility];  Service: Cardiovascular;  Laterality: Left;   A/V FISTULAGRAM Left 09/16/2023   Procedure: A/V Fistulagram;  Surgeon: Renford Dills, MD;  Location: [facility];  Service: Cardiovascular;  Laterality: Left;   A/V SHUNT INTERVENTION Right 02/21/2019   Procedure: A/V SHUNT INTERVENTION;  Surgeon: Renford Dills, MD;  Location: [facility];  Service: Cardiovascular;  Laterality: Right;   APPLICATION OF WOUND VAC Left 10/03/2017   Procedure: APPLICATION OF WOUND VAC;  Surgeon: Annice Needy, MD;  Location: [facility];  Service: General;  Laterality: Left;   APPLICATION OF WOUND VAC Left 10/11/2017    Procedure: APPLICATION OF WOUND VAC;  Surgeon: Renford Dills, MD;  Location: [facility];  Service: Vascular;  Laterality: Left;   APPLICATION OF WOUND VAC Left 10/14/2017   Procedure: WOUND VAC CHANGE;  Surgeon: Renford Dills, MD;  Location: [facility];  Service: Vascular;  Laterality: Left;  left lower leg   APPLICATION OF WOUND VAC Left 10/18/2017  Procedure: WOUND VAC CHANGE;  Surgeon: Renford Dills, MD;  Location: [facility];  Service: Vascular;  Laterality: Left;   APPLICATION OF WOUND VAC Left 10/07/2017   Procedure: APPLICATION OF WOUND VAC;  Surgeon: Renford Dills, MD;  Location: [facility];  Service: Vascular;  Laterality: Left;   APPLICATION OF WOUND VAC Left 11/17/2022   Procedure: Wound vac exchange;  Surgeon: Renford Dills, MD;  Location: [facility];  Service: Vascular;  Laterality: Left;   APPLICATION OF WOUND VAC Left 11/10/2022   Procedure: APPLICATION OF WOUND VAC;  Surgeon: Renford Dills, MD;  Location: [facility];  Service: Vascular;  Laterality: Left;   APPLICATION OF WOUND VAC Left 11/24/2022   Procedure: WOUND VAC REMOVAL;  Surgeon: Renford Dills, MD;  Location: [facility];  Service: Vascular;  Laterality: Left;   APPLICATION OF WOUND VAC Left 11/12/2022   Procedure: APPLICATION OF WOUND VAC;  Surgeon: Renford Dills, MD;  Location: [facility];  Service: Vascular;  Laterality: Left;   APPLICATION OF WOUND VAC Left 01/06/2023   Procedure: APPLICATION OF WOUND VAC;  Surgeon: Renford Dills, MD;  Location: [facility];  Service: Vascular;  Laterality: Left;   AV FISTULA INSERTION W/ RF MAGNETIC GUIDANCE Right 07/19/2018   Procedure: AV FISTULA INSERTION W/RF MAGNETIC GUIDANCE;  Surgeon: Renford Dills, MD;  Location: [facility];  Service: Cardiovascular;  Laterality: Right;   AV FISTULA PLACEMENT Left 05/11/2019   Procedure: ARTERIOVENOUS (AV) FISTULA CREATION (RADIOCEPHALIC );  Surgeon: Renford Dills, MD;  Location: [facility];  Service:  Vascular;  Laterality: Left;   AV FISTULA PLACEMENT Left 02/20/2020   Procedure: INSERTION OF ARTERIOVENOUS (AV) GORE-TEX GRAFT LEFT ARM ( FOREARM LOOP );  Surgeon: Renford Dills, MD;  Location: [facility];  Service: Vascular;  Laterality: Left;   CATARACT EXTRACTION W/PHACO Left 11/01/2018   Procedure: CATARACT EXTRACTION PHACO AND INTRAOCULAR LENS PLACEMENT (IOC)-LEFT, DIABETIC-INSULIN DEPENDENT;  Surgeon: Nevada Crane, MD;  Location: [facility];  Service: Ophthalmology;  Laterality: Left;  [place] 00:41.8 CDE 4.61 Fluid Pack Lot # [id number]   CATARACT EXTRACTION W/PHACO Right 04/27/2019   Procedure: CATARACT EXTRACTION PHACO AND INTRAOCULAR LENS PLACEMENT (IOC);  Surgeon: Nevada Crane, MD;  Location: [facility];  Service: Ophthalmology;  Laterality: Right;  [place] 00:34 CDE 1.97 FLUID PACK LOT # [id number]    COLONOSCOPY WITH PROPOFOL N/A 05/12/2020   Procedure: COLONOSCOPY WITH PROPOFOL;  Surgeon: Toledo, Boykin Nearing, MD;  Location: [facility];  Service: Gastroenterology;  Laterality: N/A;   DIALYSIS/PERMA CATHETER INSERTION N/A 02/23/2019   Procedure: DIALYSIS/PERMA CATHETER INSERTION;  Surgeon: Annice Needy, MD;  Location: [facility];  Service: Cardiovascular;  Laterality: N/A;   DIALYSIS/PERMA CATHETER REMOVAL N/A 04/03/2020   Procedure: DIALYSIS/PERMA CATHETER REMOVAL;  Surgeon: Renford Dills, MD;  Location: [facility];  Service: Cardiovascular;  Laterality: N/A;   DIALYSIS/PERMA CATHETER REMOVAL N/A 02/03/2022   Procedure: DIALYSIS/PERMA CATHETER REMOVAL;  Surgeon: Renford Dills, MD;  Location: [facility];  Service: Cardiovascular;  Laterality: N/A;   I & D EXTREMITY Left 10/11/2017   Procedure: IRRIGATION AND DEBRIDEMENT EXTREMITY;  Surgeon: Renford Dills, MD;  Location: [facility];  Service: Vascular;  Laterality: Left;   INCISION AND DRAINAGE ABSCESS Right 10/07/2017   Procedure: INCISION AND DRAINAGE ABSCESS;  Surgeon: Renford Dills, MD;   Location: [facility];  Service: Vascular;  Laterality: Right;   INCISION AND DRAINAGE OF WOUND Left 11/17/2022  Procedure: DEBRIDEMENT WOUND;  Surgeon: Renford Dills, MD;  Location: [facility];  Service: Vascular;  Laterality: Left;   INCISION AND DRAINAGE OF WOUND Left 11/24/2022   Procedure: DEBRIDEMENT WOUND LEFT LOWER EXTREMITY;  Surgeon: Renford Dills, MD;  Location: [facility];  Service: Vascular;  Laterality: Left;   IRRIGATION AND DEBRIDEMENT ABSCESS Left 10/03/2017   Procedure: IRRIGATION AND DEBRIDEMENT ABSCESS with debridement of skin, soft tissue, muscle 50sq cm;  Surgeon: Annice Needy, MD;  Location: [facility];  Service: General;  Laterality: Left;   LOWER EXTREMITY ANGIOGRAPHY Left 10/15/2022   Procedure: Lower Extremity Angiography;  Surgeon: Renford Dills, MD;  Location: [facility];  Service: Cardiovascular;  Laterality: Left;   TEE WITHOUT CARDIOVERSION N/A 03/29/2023   Procedure: TRANSESOPHAGEAL ECHOCARDIOGRAM;  Surgeon: Antonieta Iba, MD;  Location: [facility];  Service: Cardiovascular;  Laterality: N/A;   TEMPORARY DIALYSIS CATHETER  02/20/2019   Procedure: TEMPORARY DIALYSIS CATHETER;  Surgeon: Renford Dills, MD;  Location: [facility];  Service: Cardiovascular;;   UPPER EXTREMITY ANGIOGRAPHY Left 09/18/2019   Procedure: UPPER EXTREMITY ANGIOGRAPHY;  Surgeon: Renford Dills, MD;  Location: [facility];  Service: Cardiovascular;  Laterality: Left;   WOUND DEBRIDEMENT Left 11/10/2022   Procedure: DEBRIDEMENT WOUND;  Surgeon: Renford Dills, MD;  Location: [facility];  Service: Vascular;  Laterality: Left;   WOUND DEBRIDEMENT Left 11/12/2022   Procedure: DEBRIDEMENT WOUND;  Surgeon: Renford Dills, MD;  Location: [facility];  Service: Vascular;  Laterality: Left;   WOUND DEBRIDEMENT Left 01/06/2023   Procedure: DEBRIDEMENT WOUND;  Surgeon: Renford Dills, MD;  Location: [facility];  Service: Vascular;  Laterality: Left;    Family  History  Problem Relation Age of Onset   Diabetes Father    Kidney disease Father    Diabetes Daughter     Allergies  Allergen Reactions   Lisinopril Swelling    Lips mouth    Furosemide Cough    Light headed, blurry vision, weakness.   Latex Itching   Silicone Itching   Adhesive [Tape] Itching and Rash       Latest Ref Rng & Units 03/29/2023    6:20 AM 03/28/2023    5:14 AM 03/27/2023    4:43 AM  CBC  WBC 4.0 - 10.5 K/uL 5.7  6.8  9.9   Hemoglobin 13.0 - 17.0 g/dL 14.7  82.9  56.2   Hematocrit 39.0 - 52.0 % 34.0  32.4  33.9   Platelets 150 - 400 K/uL 151  144  126       CMP     Component Value Date/Time   NA 138 03/29/2023 0620   K 4.1 03/29/2023 0620   CL 99 03/29/2023 0620   CO2 24 03/29/2023 0620   GLUCOSE 124 (H) 03/29/2023 0620   BUN 82 (H) 03/29/2023 0620   CREATININE 13.30 (H) 03/29/2023 0620   CALCIUM 9.5 03/29/2023 0620   PROT 9.1 (H) 03/24/2023 0941   ALBUMIN 3.9 03/24/2023 0941   AST 18 03/24/2023 0941   ALT 15 03/24/2023 0941   ALKPHOS 56 03/24/2023 0941   BILITOT 0.9 03/24/2023 0941   GFRNONAA 4 (L) 03/29/2023 0620     No results found.     Assessment & Plan:   1. End-stage renal disease (HCC) (Primary) Recommend:  The patient is experiencing increasing problems with their dialysis access.  Patient should have a fistulagram with the intention for intervention.  The intention for intervention  is to restore appropriate flow and prevent thrombosis and possible loss of the access.  As well as improve the quality of dialysis therapy.  The risks, benefits and alternative therapies were reviewed in detail with the patient.  All questions were answered.  The patient agrees to proceed with angio/intervention.    The patient will follow up with me in the office after the procedure.   2. Primary hypertension Continue antihypertensive medications as already ordered, these medications have been reviewed and there are no changes at this time.  3.  Lymphedema Previous wound is healed.  He is doing well in regards to leg swelling.  He will continue with use of his lymphedema pump as well as compression.   Current Outpatient Medications on File Prior to Visit  Medication Sig Dispense Refill   ACCU-CHEK GUIDE test strip USE 1 STRIP TOTAL 2 TIMES DAILY FOR 90 DAYS     acetaminophen (TYLENOL) 325 MG tablet Take 2 tablets (650 mg total) by mouth every 6 (six) hours as needed for mild pain (or Fever >/= 101).     allopurinol (ZYLOPRIM) 100 MG tablet Take 100 mg by mouth daily.     amLODipine (NORVASC) 10 MG tablet Take 5 mg by mouth daily as needed (if bp is 190/90 or higher).      apixaban (ELIQUIS) 5 MG TABS tablet Take 1 tablet (5 mg total) by mouth 2 (two) times daily. 60 tablet 3   atenolol (TENORMIN) 50 MG tablet Take 25 mg by mouth daily as needed (if bp is 190/90 or higher).      atorvastatin (LIPITOR) 80 MG tablet Take 80 mg by mouth at bedtime.     B Complex-C-Folic Acid (RENA-VITE RX) 1 MG TABS Take 1 tablet by mouth 3 (three) times a week.      calcium acetate (PHOSLO) 667 MG capsule Take 1,334-2,001 mg by mouth See admin instructions. Take 2001 mg by mouth with each meal & take 1334 mg by mouth with each snack.     cinacalcet (SENSIPAR) 30 MG tablet Take 30 mg by mouth daily.     CINNAMON PO Take 500 mg by mouth 2 (two) times daily.      clopidogrel (PLAVIX) 75 MG tablet TAKE 1 TABLET BY MOUTH EVERY DAY 90 tablet 1   Continuous Glucose Sensor (DEXCOM G7 SENSOR) MISC USE 1 EACH EVERY 10 (TEN) DAYS     doxycycline (VIBRA-TABS) 100 MG tablet Take 100 mg by mouth 2 (two) times daily.     Insulin Aspart FlexPen (NOVOLOG) 100 UNIT/ML Inject 5 Units subcutaneously 3 (three) times daily with meals SKIP dose if your sugar before your meal is your sugar is below 100.     insulin degludec (TRESIBA) 100 UNIT/ML FlexTouch Pen Inject 30 Units subcutaneously once daily     Insulin Glargine (LANTUS SOLOSTAR) 100 UNIT/ML Solostar Pen Inject 20 Units  into the skin at bedtime.     lidocaine-prilocaine (EMLA) cream Apply 1 application topically as needed (port access).     Multiple Vitamin (MULTIVITAMIN WITH MINERALS) TABS tablet Take 1 tablet by mouth 3 (three) times a week. Men's Multivitamin     Multiple Vitamins-Minerals (EYE HEALTH PO) Take 1 capsule by mouth daily.      Polyethyl Glycol-Propyl Glycol (SYSTANE OP) Place 1 drop into both eyes daily as needed (dry eyes).     traZODone (DESYREL) 50 MG tablet Take 50 mg by mouth at bedtime.     Melatonin 5 MG CAPS Take 1  capsule by mouth at bedtime as needed. (Patient not taking: Reported on 09/16/2023)     sildenafil (REVATIO) 20 MG tablet Take 1 tablet daily.  Do not take with nitrates. (Patient not taking: Reported on 09/16/2023) 30 tablet 11   No current facility-administered medications on file prior to visit.    There are no Patient Instructions on file for this visit. No follow-ups on file.   Georgiana Spinner, NP

## 2023-10-31 ENCOUNTER — Telehealth (INDEPENDENT_AMBULATORY_CARE_PROVIDER_SITE_OTHER): Payer: Self-pay

## 2023-10-31 NOTE — Telephone Encounter (Signed)
 Spoke with the patient and he is scheduled with Dr. Gilda Crease for a left arm fistulagram on 11/07/23 with a 1:00 pm arrival time to the [facility]. Pre-procedure instructions were discussed and will be mailed.

## 2023-11-08 ENCOUNTER — Ambulatory Visit: Admission: RE | Admit: 2023-11-08 | Payer: Self-pay | Source: Home / Self Care | Admitting: Vascular Surgery

## 2023-11-08 ENCOUNTER — Telehealth (INDEPENDENT_AMBULATORY_CARE_PROVIDER_SITE_OTHER): Payer: Self-pay

## 2023-11-08 ENCOUNTER — Encounter: Admission: RE | Payer: Self-pay | Source: Home / Self Care

## 2023-11-08 DIAGNOSIS — N186 End stage renal disease: Secondary | ICD-10-CM

## 2023-11-08 SURGERY — A/V FISTULAGRAM
Anesthesia: Moderate Sedation

## 2023-11-08 NOTE — Telephone Encounter (Signed)
 Patient called in to cancel his left arm fistulagram with Dr. Gilda Crease for 11/08/23. Patient stated he is having trouble with his insurance and cannot pay the money that is requested to have his procedure today. Patient stated he will call back to reschedule.

## 2023-12-02 ENCOUNTER — Ambulatory Visit: Payer: Self-pay | Admitting: *Deleted

## 2023-12-02 NOTE — Patient Outreach (Addendum)
 Care Coordination   Follow Up Visit Note   12/02/2023 Name: [name] MRN: [id number] DOB: August 08, 1971  [name] is a 53 y.o. year old male who sees [name], [name], MD for primary care. I spoke with  [name] by phone today.  What matters to the patients health and wellness today?  Patient report he is doing better, leg wounds are healed and now working on better DM management.  A1C increased to 8.4, still above goal.  He is making changes to daily routine, hoping next reading will be better.  Denies any urgent concerns, encouraged to contact this care manager with questions.     Goals Addressed             This Visit's Progress    COMPLETED: Effective management of DM   On track    Care Coordination Interventions: Provided education to patient about basic DM disease process Reviewed medications with patient and discussed importance of medication adherence Counseled on importance of regular laboratory monitoring as prescribed Advised patient, providing education and rationale, to check cbg daily and record, calling provider for findings outside established parameters Report he is adherent to HD on Tuesdays, Thursdays, and Saturdays       COMPLETED: Effective wound healing of leg wound   On track    Care Coordination Interventions: Evaluation of current treatment plan related to leg wound and patient's adherence to plan as established by provider Advised patient to call [facility] agency to discuss concerns for treatment (regarding availability of supplies) Reviewed scheduled/upcoming provider appointments including routine visits to wound care center Discussed plans with patient for ongoing care management follow up and provided patient with direct contact information for care management team         SDOH assessments and interventions completed:  No     Care Coordination Interventions:  Yes, provided   Interventions Today    Flowsheet Row Most Recent Value   Chronic Disease   Chronic disease during today's visit Diabetes  General Interventions   General Interventions Discussed/Reviewed General Interventions Reviewed, Labs, Doctor Visits, Durable Medical Equipment (DME)  Labs Hgb A1c every 3 months  Doctor Visits Discussed/Reviewed Doctor Visits Reviewed, PCP, Specialist  [HD sessions are TTS, PCP on 5/5, Endocrinology 8/6]  Durable Medical Equipment (DME) Glucomoter  PCP/Specialist Visits Compliance with follow-up visit  Education Interventions   Education Provided Provided Education  Provided Verbal Education On Blood Sugar Monitoring, Medication, When to see the doctor, Labs, Nutrition  [Meds reviewed, Insulin was increased due to increase in A1C, otherwise no changes, State he will continue to work on monitoring blood sugars]  Labs Reviewed Hgb A1c  [Aware that goal remains less than 7]  Nutrition Interventions   Nutrition Discussed/Reviewed Adding fruits and vegetables, Carbohydrate meal planning, Nutrition Reviewed, Portion sizes, Decreasing sugar intake       Follow up plan: Follow up call scheduled for 5/9 with Shauna Hugh, [facility], patient aware.    Encounter Outcome:  Patient Visit Completed   Rodney Langton, RN, MSN, CCM [place]  [facility], [facility] RN Care Coordinator Direct Dial: [phone] / Main [phone] Fax [phone] Email: [email or web address]

## 2023-12-09 ENCOUNTER — Telehealth (INDEPENDENT_AMBULATORY_CARE_PROVIDER_SITE_OTHER): Payer: Self-pay

## 2023-12-09 NOTE — Telephone Encounter (Addendum)
 Patient called in stating that his left arm fistula is clotted. Patient stated he has not had dialysis since Tuesday, he is concerned that his insurance will not pay. I talked with Sheppard Plumber NP and per her the patient needs to be seen at the ED for evaluation as he has not had dialysis since Tuesday. Patient was advised of the NP's recommendation and stated he thought he may need to do so and was a little concerned that he may have to have a permcath. I have not received a call or fax regarding the patient needing a declot of his access.

## 2023-12-12 ENCOUNTER — Inpatient Hospital Stay
Admission: EM | Admit: 2023-12-12 | Discharge: 2023-12-16 | DRG: 252 | Disposition: A | Discharge status: Home / Self Care | Attending: Osteopathic Medicine | Admitting: Osteopathic Medicine

## 2023-12-12 ENCOUNTER — Other Ambulatory Visit: Payer: Self-pay

## 2023-12-12 DIAGNOSIS — I120 Hypertensive chronic kidney disease with stage 5 chronic kidney disease or end stage renal disease: Secondary | ICD-10-CM | POA: Diagnosis present

## 2023-12-12 DIAGNOSIS — Z6841 Body Mass Index (BMI) 40.0 and over, adult: Secondary | ICD-10-CM

## 2023-12-12 DIAGNOSIS — N186 End stage renal disease: Secondary | ICD-10-CM | POA: Diagnosis not present

## 2023-12-12 DIAGNOSIS — E66813 Obesity, class 3: Secondary | ICD-10-CM | POA: Diagnosis present

## 2023-12-12 DIAGNOSIS — E1065 Type 1 diabetes mellitus with hyperglycemia: Secondary | ICD-10-CM | POA: Diagnosis present

## 2023-12-12 DIAGNOSIS — Z91040 Latex allergy status: Secondary | ICD-10-CM

## 2023-12-12 DIAGNOSIS — E785 Hyperlipidemia, unspecified: Secondary | ICD-10-CM | POA: Diagnosis present

## 2023-12-12 DIAGNOSIS — I10 Essential (primary) hypertension: Secondary | ICD-10-CM | POA: Diagnosis not present

## 2023-12-12 DIAGNOSIS — Y832 Surgical operation with anastomosis, bypass or graft as the cause of abnormal reaction of the patient, or of later complication, without mention of misadventure at the time of the procedure: Secondary | ICD-10-CM | POA: Diagnosis present

## 2023-12-12 DIAGNOSIS — Z992 Dependence on renal dialysis: Secondary | ICD-10-CM

## 2023-12-12 DIAGNOSIS — I890 Lymphedema, not elsewhere classified: Secondary | ICD-10-CM | POA: Diagnosis present

## 2023-12-12 DIAGNOSIS — D631 Anemia in chronic kidney disease: Secondary | ICD-10-CM | POA: Diagnosis present

## 2023-12-12 DIAGNOSIS — I739 Peripheral vascular disease, unspecified: Secondary | ICD-10-CM | POA: Diagnosis present

## 2023-12-12 DIAGNOSIS — E10319 Type 1 diabetes mellitus with unspecified diabetic retinopathy without macular edema: Secondary | ICD-10-CM | POA: Diagnosis present

## 2023-12-12 DIAGNOSIS — E1051 Type 1 diabetes mellitus with diabetic peripheral angiopathy without gangrene: Secondary | ICD-10-CM | POA: Diagnosis present

## 2023-12-12 DIAGNOSIS — Z7901 Long term (current) use of anticoagulants: Secondary | ICD-10-CM

## 2023-12-12 DIAGNOSIS — T82868A Thrombosis of vascular prosthetic devices, implants and grafts, initial encounter: Principal | ICD-10-CM | POA: Diagnosis present

## 2023-12-12 DIAGNOSIS — E1022 Type 1 diabetes mellitus with diabetic chronic kidney disease: Secondary | ICD-10-CM | POA: Diagnosis present

## 2023-12-12 DIAGNOSIS — Z87891 Personal history of nicotine dependence: Secondary | ICD-10-CM

## 2023-12-12 DIAGNOSIS — M109 Gout, unspecified: Secondary | ICD-10-CM | POA: Diagnosis present

## 2023-12-12 DIAGNOSIS — N2581 Secondary hyperparathyroidism of renal origin: Secondary | ICD-10-CM | POA: Diagnosis present

## 2023-12-12 DIAGNOSIS — J449 Chronic obstructive pulmonary disease, unspecified: Secondary | ICD-10-CM | POA: Diagnosis not present

## 2023-12-12 DIAGNOSIS — Z794 Long term (current) use of insulin: Secondary | ICD-10-CM

## 2023-12-12 DIAGNOSIS — T82590A Other mechanical complication of surgically created arteriovenous fistula, initial encounter: Principal | ICD-10-CM

## 2023-12-12 DIAGNOSIS — Z7902 Long term (current) use of antithrombotics/antiplatelets: Secondary | ICD-10-CM

## 2023-12-12 DIAGNOSIS — T82898A Other specified complication of vascular prosthetic devices, implants and grafts, initial encounter: Secondary | ICD-10-CM | POA: Diagnosis not present

## 2023-12-12 DIAGNOSIS — E8770 Fluid overload, unspecified: Secondary | ICD-10-CM | POA: Diagnosis present

## 2023-12-12 DIAGNOSIS — Z79899 Other long term (current) drug therapy: Secondary | ICD-10-CM

## 2023-12-12 LAB — CBC WITH DIFFERENTIAL/PLATELET
Abs Immature Granulocytes: 0.01 10*3/uL (ref 0.00–0.07)
Basophils Absolute: 0 10*3/uL (ref 0.0–0.1)
Basophils Relative: 1 %
Eosinophils Absolute: 0.1 10*3/uL (ref 0.0–0.5)
Eosinophils Relative: 2 %
HCT: 42.2 % (ref 39.0–52.0)
Hemoglobin: 13.8 g/dL (ref 13.0–17.0)
Immature Granulocytes: 0 %
Lymphocytes Relative: 19 %
Lymphs Abs: 1.2 10*3/uL (ref 0.7–4.0)
MCH: 33 pg (ref 26.0–34.0)
MCHC: 32.7 g/dL (ref 30.0–36.0)
MCV: 101 fL — ABNORMAL HIGH (ref 80.0–100.0)
Monocytes Absolute: 0.5 10*3/uL (ref 0.1–1.0)
Monocytes Relative: 8 %
Neutro Abs: 4.4 10*3/uL (ref 1.7–7.7)
Neutrophils Relative %: 70 %
Platelets: 147 10*3/uL — ABNORMAL LOW (ref 150–400)
RBC: 4.18 MIL/uL — ABNORMAL LOW (ref 4.22–5.81)
RDW: 14.4 % (ref 11.5–15.5)
WBC: 6.3 10*3/uL (ref 4.0–10.5)
nRBC: 0 % (ref 0.0–0.2)

## 2023-12-12 LAB — COMPREHENSIVE METABOLIC PANEL WITH GFR
ALT: 13 U/L (ref 0–44)
AST: 9 U/L — ABNORMAL LOW (ref 15–41)
Albumin: 3.9 g/dL (ref 3.5–5.0)
Alkaline Phosphatase: 51 U/L (ref 38–126)
Anion gap: 17 — ABNORMAL HIGH (ref 5–15)
BUN: 115 mg/dL — ABNORMAL HIGH (ref 6–20)
CO2: 20 mmol/L — ABNORMAL LOW (ref 22–32)
Calcium: 9.2 mg/dL (ref 8.9–10.3)
Chloride: 104 mmol/L (ref 98–111)
Creatinine, Ser: 21.6 mg/dL — ABNORMAL HIGH (ref 0.61–1.24)
GFR, Estimated: 2 mL/min — ABNORMAL LOW (ref >60)
Glucose, Bld: 116 mg/dL — ABNORMAL HIGH (ref 70–99)
Potassium: 5 mmol/L (ref 3.5–5.1)
Sodium: 141 mmol/L (ref 135–145)
Total Bilirubin: 1 mg/dL (ref 0.0–1.2)
Total Protein: 7.5 g/dL (ref 6.5–8.1)

## 2023-12-12 LAB — APTT: aPTT: 35 s (ref 24–36)

## 2023-12-12 LAB — CBG MONITORING, ED: Glucose-Capillary: 99 mg/dL (ref 70–99)

## 2023-12-12 MED ORDER — SODIUM ZIRCONIUM CYCLOSILICATE 10 G PO PACK
10.0000 g | PACK | Freq: Once | ORAL | Status: AC
  Administered 2023-12-12: 10 g via ORAL
  Filled 2023-12-12: qty 1

## 2023-12-12 MED ORDER — INSULIN ASPART 100 UNIT/ML IJ SOLN
0.0000 [IU] | Freq: Three times a day (TID) | INTRAMUSCULAR | Status: DC
  Administered 2023-12-13 – 2023-12-16 (×4): 1 [IU] via SUBCUTANEOUS
  Filled 2023-12-12 (×4): qty 1

## 2023-12-12 MED ORDER — INSULIN ASPART 100 UNIT/ML IJ SOLN: 0.0000 [IU] | Freq: Every day | INTRAMUSCULAR | Status: DC

## 2023-12-12 MED ORDER — HEPARIN (PORCINE) 25000 UT/250ML-% IV SOLN
2150.0000 [IU]/h | INTRAVENOUS | Status: DC
  Administered 2023-12-12: 1600 [IU]/h via INTRAVENOUS
  Administered 2023-12-14 (×2): 1800 [IU]/h via INTRAVENOUS
  Filled 2023-12-12 (×5): qty 250

## 2023-12-12 MED ORDER — HEPARIN BOLUS VIA INFUSION
4000.0000 [IU] | Freq: Once | INTRAVENOUS | Status: AC
  Administered 2023-12-12: 4000 [IU] via INTRAVENOUS
  Filled 2023-12-12: qty 4000

## 2023-12-12 MED ORDER — ALBUTEROL SULFATE (2.5 MG/3ML) 0.083% IN NEBU: 2.5000 mg | INHALATION_SOLUTION | RESPIRATORY_TRACT | Status: DC | PRN

## 2023-12-12 MED ORDER — HYDRALAZINE HCL 20 MG/ML IJ SOLN: 10.0000 mg | INTRAMUSCULAR | Status: DC | PRN

## 2023-12-12 MED ORDER — ACETAMINOPHEN 325 MG PO TABS
650.0000 mg | ORAL_TABLET | Freq: Four times a day (QID) | ORAL | Status: DC | PRN
  Administered 2023-12-14: 650 mg via ORAL
  Filled 2023-12-12: qty 2

## 2023-12-12 MED ORDER — DM-GUAIFENESIN ER 30-600 MG PO TB12: 1.0000 | ORAL_TABLET | Freq: Two times a day (BID) | ORAL | Status: DC | PRN

## 2023-12-12 MED ORDER — ONDANSETRON HCL 4 MG/2ML IJ SOLN: 4.0000 mg | Freq: Three times a day (TID) | INTRAMUSCULAR | Status: DC | PRN

## 2023-12-12 NOTE — Progress Notes (Signed)
 PHARMACY - ANTICOAGULATION CONSULT NOTE  Pharmacy Consult for heparin infusion Indication: h/o PVD  Allergies  Allergen Reactions   Lisinopril Swelling    Lips mouth    Furosemide Cough    Light headed, blurry vision, weakness.   Latex Itching   Silicone Itching   Adhesive [Tape] Itching and Rash    Patient Measurements: 178.7 kg (recorded 11/30/23) HDW: 129.7 kg    Vital Signs: Temp: 97.7 F (36.5 C) (04/14 1951) Temp Source: Oral (04/14 1951) BP: 152/77 (04/14 1951) Pulse Rate: 62 (04/14 1951)  Labs: Recent Labs    12/12/23 1518  HGB 13.8  HCT 42.2  PLT 147*  APTT 35  CREATININE 21.60*    CrCl cannot be calculated (Unknown ideal weight.).   Medical History: Past Medical History:  Diagnosis Date   Anemia    Chronic kidney disease    14% FUNCTION   Diabetes mellitus without complication (HCC)    Diabetic retinopathy (HCC)    Dyspnea    DOE   Dysrhythmia    End stage renal disease (HCC)    History of orthopnea    error wrong patietn   Hypercholesteremia    error   Hyperkalemia    error wrong patient   Hypertension    Long-term insulin use (HCC)    Lymphedema of leg    Metabolic encephalopathy    error   Microalbuminuria    MSSA (methicillin susceptible Staphylococcus aureus)    Obesity    PVD (peripheral vascular disease) (HCC)    Respiratory failure (HCC)    Error   Sepsis (HCC)    error   Sleep apnea    no CPAP   UTI (urinary tract infection)    error   Venous ulcer (HCC)    Vitamin D deficiency     Assessment: 53 y.o. male with medical history significant of  ESRD-HD (TTS), HTN. HLD, DM, COPD, PVD on Eliquis, gout, endocarditis, lymphedema, obesity, who presents with AV fistula occlusion in left arm. Platelets LEN at baseline. Last dose of apixaban 12/11/23 am   Goal of Therapy:  anti-Xa level 0.3-0.7 units/ml aPTT 66 - 102 seconds Monitor platelets by anticoagulation protocol: Yes   Plan:  ---Give heparin 4000 units IV bolus x  1 ---Start heparin infusion at 1600 units/hr ---Check aPTT in 8 hours and anti-Xa level once daily while on heparin (we will use aPTT to guide therapy until aPTT and anti-Xa correlate) ---Continue to monitor H&H and platelets  Adalberto Acton 12/12/2023,9:46 PM

## 2023-12-12 NOTE — ED Triage Notes (Addendum)
 Pt comes with clotted dialysis fistula to left arm. Pt states he did have dialysis Tues, Thursday and it was clotted so they didn't do any treatment, pt didn't have treatment on Saturday either.   Pt states he does feel like he is in fluid overload and has swelling.

## 2023-12-12 NOTE — ED Provider Triage Note (Signed)
 Emergency Medicine Provider Triage Evaluation Note  Delvon MORDCHE HEDGLIN , a 53 y.o. male  was evaluated in triage.  Pt complains of clotted dialysis catheter. Patient last had full dialysis last Tuesday. He went to dialysis on Thursday when they realized it was clotted so he missed dialysis Thursday and Saturday.  Patient states he feels fluid overloaded.  Review of Systems  Positive: swelling Negative: CP, SOB  Physical Exam  There were no vitals taken for this visit. Gen:   Awake, no distress   Resp:  Normal effort  MSK:   Moves extremities without difficulty  Other:    Medical Decision Making  Medically screening exam initiated at 3:11 PM.  Appropriate orders placed.  Refujio A Dolinger was informed that the remainder of the evaluation will be completed by another provider, this initial triage assessment does not replace that evaluation, and the importance of remaining in the ED until their evaluation is complete.     Phyliss Breen, PA-C 12/12/23 1513

## 2023-12-12 NOTE — ED Provider Notes (Signed)
 [facility] Provider Note    Event Date/Time   First MD Initiated Contact with Patient 12/12/23 1841     (approximate)   History   Vascular Access Problem  Pt comes with clotted dialysis fistula to left arm. Pt states he did have dialysis Tues, Thursday and it was clotted so they didn't do any treatment, pt didn't have treatment on Saturday either.   Pt states he does feel like he is in fluid overload and has swelling.    HPI [name] is a 53 y.o. male PMH multiple medical comorbidities including ESRD on dialysis (Tuesday, Thursday, Saturday), diabetes, hypertension presents for evaluation of dialysis fistula malfunction - Patient notes he went to dialysis on Thursday, was told his fistula was clotted and they were unable to use it.  Did not go to dialysis on Saturday.  Comes to emergency department today for fistula eval. - Has been using left upper extremity aVF for about 2 years - No bleeding from fistula - No shortness of breath      Physical Exam   Triage Vital Signs: ED Triage Vitals  Encounter Vitals Group     BP 12/12/23 1512 (!) 182/95     Systolic BP Percentile --      Diastolic BP Percentile --      Pulse Rate 12/12/23 1512 77     Resp 12/12/23 1512 18     Temp 12/12/23 1512 97.9 F (36.6 C)     Temp src --      SpO2 12/12/23 1512 97 %     Weight --      Height --      Head Circumference --      Peak Flow --      Pain Score 12/12/23 1510 0     Pain Loc --      Pain Education --      Exclude from Growth Chart --     Most recent vital signs: Vitals:   12/12/23 1512  BP: (!) 182/95  Pulse: 77  Resp: 18  Temp: 97.9 F (36.6 C)  SpO2: 97%     General: Awake, no distress.  CV:  Good peripheral perfusion. RRR, RP 2+. LUE AVF w/ no palpable thrill Resp:  Normal effort. CTAB Abd:  No distention. Nontender to deep palpation throughout Neuro:  AOX 4, mentating well   ED Results / Procedures / Treatments    Labs (all labs ordered are listed, but only abnormal results are displayed) Labs Reviewed  COMPREHENSIVE METABOLIC PANEL WITH GFR - Abnormal; Notable for the following components:      Result Value   CO2 20 (*)    Glucose, Bld 116 (*)    BUN 115 (*)    Creatinine, Ser 21.60 (*)    AST 9 (*)    GFR, Estimated 2 (*)    Anion gap 17 (*)    All other components within normal limits  CBC WITH DIFFERENTIAL/PLATELET - Abnormal; Notable for the following components:   RBC 4.18 (*)    MCV 101.0 (*)    Platelets 147 (*)    All other components within normal limits     EKG  N/a   RADIOLOGY N/a    PROCEDURES:  Critical Care performed: No  Procedures   MEDICATIONS ORDERED IN ED: Medications - No data to display   IMPRESSION / MDM / ASSESSMENT AND PLAN / ED COURSE  I reviewed the triage vital signs and the  nursing notes.                              DDX/MDM/AP: Differential diagnosis includes, but is not limited to, clotted fistula or other fistula malfunction.  Screening labs fortunately with no hyperkalemia.  Notable uremia above patient is mentating appropriately on my eval.  Suspect some hypervolemia but no decompensation at this time, satting well on room air with normal work of breathing, clear lung sounds on my eval.  Discussed with vascular surgery regarding need for fistulogram.  No indication for emergent dialysis at this time.  Plan: - Screening labs at triage -Will discuss with vascular surgery, anticipate likely admission  Patient's presentation is most consistent with acute presentation with potential threat to life or bodily function.  The patient is on the cardiac monitor to evaluate for evidence of arrhythmia and/or significant heart rate changes.  ED course below.  Surgery request admission, will perform fistulogram in the morning, if unable to open shunt will place separate access for dialysis.  No indication for emergent dialysis at this time.   Admitted to medicine service.  Clinical Course as of 12/12/23 1924  Mon Dec 12, 2023  1846 CMP reviewed, overall unremarkable, potassium normal.   [MM]  1846 Cbc  [MM]  1846 CBC unremarkable [MM]  1918 D/r Sharla Davis NP of vascular surgery - admit - npo at midnight - fistula eval by vascular tomorrow, will place other access prn  [MM]  1921 Hospitalist consult order placed [MM]    Clinical Course User Index [MM] Collis Deaner, MD     FINAL CLINICAL IMPRESSION(S) / ED DIAGNOSES   Final diagnoses:  Malfunction of arteriovenous dialysis fistula, initial encounter (HCC)  ESRD (end stage renal disease) (HCC)     Rx / DC Orders   ED Discharge Orders     None        Note:  This document was prepared using Dragon voice recognition software and may include unintentional dictation errors.   Collis Deaner, MD 12/12/23 [phone]

## 2023-12-12 NOTE — H&P (Signed)
 History and Physical    [name]:[id number] DOB: [birth year] DOA: 12/12/2023  Referring MD/NP/PA:   PCP: Marina Goodell, MD   Patient coming from:  The patient is coming from home.     Chief Complaint: AV fistula occlusion  HPI: [name] is a 53 y.o. male with medical history significant of  ESRD-HD (TTS), HTN. HLD, DM, COPD, PVD on Eliquis, gout, endocarditis, lymphedema, obesity, who presents with AV fistula occlusion in left arm.  Pt states that he had dialysis on Tuesday. He went to dialysis on Thursday, and was told that his fistula was clotted and they were unable to use HD.  He did not try to do dialysis on Saturday.  Patient has little dry cough and very mild SOB, no chest pain.  No fever or chills.  No pain over AV fistula area he his left arm.  Patient does not have nausea, vomiting, diarrhea or abdominal pain.  No symptoms of UTI.  Patient states that he took 10 g of Lokelam in this AM.   Data reviewed independently and ED Course: pt was found to have potassium 5.0, bicarbonate 20, creatinine 21, BUN 115, WBC 6.3, temperature normal, blood pressure 182/95, heart rate 77, RR 18, oxygen saturation 97% on room air.  Patient is placed in MedSurg bed for observation.  Dr. Thedore Mins of renal and  and VVS NP, Sheppard Plumber (with Dr. Gilda Crease) are consulted.   EKG: I have personally reviewed.  Sinus rhythm, QTc 430, LAE, poor R wave progression   Review of Systems:   General: no fevers, chills, no body weight gain, fatigue HEENT: no blurry vision, hearing changes or sore throat Respiratory: has mild dyspnea, mild dry coughing, no wheezing CV: no chest pain, no palpitations GI: no nausea, vomiting, abdominal pain, diarrhea, constipation GU: no dysuria, burning on urination, increased urinary frequency, hematuria  Ext: no leg edema Neuro: no unilateral weakness, numbness, or tingling, no vision change or hearing loss Skin: no rash, no skin tear. MSK: No muscle spasm, no  deformity, no limitation of range of movement in spin Heme: No easy bruising.  Travel history: No recent long distant travel.   Allergy:  Allergies  Allergen Reactions   Lisinopril Swelling    Lips mouth    Furosemide Cough    Light headed, blurry vision, weakness.   Latex Itching   Silicone Itching   Adhesive [Tape] Itching and Rash    Past Medical History:  Diagnosis Date   Anemia    Chronic kidney disease    14% FUNCTION   Diabetes mellitus without complication (HCC)    Diabetic retinopathy (HCC)    Dyspnea    DOE   Dysrhythmia    End stage renal disease (HCC)    History of orthopnea    error wrong patietn   Hypercholesteremia    error   Hyperkalemia    error wrong patient   Hypertension    Long-term insulin use (HCC)    Lymphedema of leg    Metabolic encephalopathy    error   Microalbuminuria    MSSA (methicillin susceptible Staphylococcus aureus)    Obesity    PVD (peripheral vascular disease) (HCC)    Respiratory failure (HCC)    Error   Sepsis (HCC)    error   Sleep apnea    no CPAP   UTI (urinary tract infection)    error   Venous ulcer (HCC)    Vitamin D deficiency  Past Surgical History:  Procedure Laterality Date   A/V FISTULAGRAM Right 10/24/2018   Procedure: A/V FISTULAGRAM;  Surgeon: Jackquelyn Mass, MD;  Location: [facility];  Service: Cardiovascular;  Laterality: Right;   A/V FISTULAGRAM Right 11/24/2018   Procedure: A/V FISTULAGRAM;  Surgeon: Jackquelyn Mass, MD;  Location: [facility];  Service: Cardiovascular;  Laterality: Right;   A/V FISTULAGRAM Left 07/31/2019   Procedure: A/V FISTULAGRAM;  Surgeon: Jackquelyn Mass, MD;  Location: [facility];  Service: Cardiovascular;  Laterality: Left;   A/V FISTULAGRAM Left 12/11/2019   Procedure: A/V FISTULAGRAM;  Surgeon: Jackquelyn Mass, MD;  Location: [facility];  Service: Cardiovascular;  Laterality: Left;   A/V FISTULAGRAM Left 07/08/2023    Procedure: A/V Fistulagram;  Surgeon: Jackquelyn Mass, MD;  Location: [facility];  Service: Cardiovascular;  Laterality: Left;   A/V FISTULAGRAM Left 09/16/2023   Procedure: A/V Fistulagram;  Surgeon: Jackquelyn Mass, MD;  Location: [facility];  Service: Cardiovascular;  Laterality: Left;   A/V SHUNT INTERVENTION Right 02/21/2019   Procedure: A/V SHUNT INTERVENTION;  Surgeon: Jackquelyn Mass, MD;  Location: [facility];  Service: Cardiovascular;  Laterality: Right;   APPLICATION OF WOUND VAC Left 10/03/2017   Procedure: APPLICATION OF WOUND VAC;  Surgeon: Celso College, MD;  Location: [facility];  Service: General;  Laterality: Left;   APPLICATION OF WOUND VAC Left 10/11/2017   Procedure: APPLICATION OF WOUND VAC;  Surgeon: Jackquelyn Mass, MD;  Location: [facility];  Service: Vascular;  Laterality: Left;   APPLICATION OF WOUND VAC Left 10/14/2017   Procedure: WOUND VAC CHANGE;  Surgeon: Jackquelyn Mass, MD;  Location: [facility];  Service: Vascular;  Laterality: Left;  left lower leg   APPLICATION OF WOUND VAC Left 10/18/2017   Procedure: WOUND VAC CHANGE;  Surgeon: Jackquelyn Mass, MD;  Location: [facility];  Service: Vascular;  Laterality: Left;   APPLICATION OF WOUND VAC Left 10/07/2017   Procedure: APPLICATION OF WOUND VAC;  Surgeon: Jackquelyn Mass, MD;  Location: [facility];  Service: Vascular;  Laterality: Left;   APPLICATION OF WOUND VAC Left 11/17/2022   Procedure: Wound vac exchange;  Surgeon: Jackquelyn Mass, MD;  Location: [facility];  Service: Vascular;  Laterality: Left;   APPLICATION OF WOUND VAC Left 11/10/2022   Procedure: APPLICATION OF WOUND VAC;  Surgeon: Jackquelyn Mass, MD;  Location: [facility];  Service: Vascular;  Laterality: Left;   APPLICATION OF WOUND VAC Left 11/24/2022   Procedure: WOUND VAC REMOVAL;  Surgeon: Jackquelyn Mass, MD;  Location: [facility];  Service: Vascular;  Laterality: Left;   APPLICATION OF WOUND VAC Left 11/12/2022    Procedure: APPLICATION OF WOUND VAC;  Surgeon: Jackquelyn Mass, MD;  Location: [facility];  Service: Vascular;  Laterality: Left;   APPLICATION OF WOUND VAC Left 01/06/2023   Procedure: APPLICATION OF WOUND VAC;  Surgeon: Jackquelyn Mass, MD;  Location: [facility];  Service: Vascular;  Laterality: Left;   AV FISTULA INSERTION W/ RF MAGNETIC GUIDANCE Right 07/19/2018   Procedure: AV FISTULA INSERTION W/RF MAGNETIC GUIDANCE;  Surgeon: Jackquelyn Mass, MD;  Location: [facility];  Service: Cardiovascular;  Laterality: Right;   AV FISTULA PLACEMENT Left 05/11/2019   Procedure: ARTERIOVENOUS (AV) FISTULA CREATION (RADIOCEPHALIC );  Surgeon: Jackquelyn Mass, MD;  Location: [facility];  Service: Vascular;  Laterality: Left;   AV FISTULA PLACEMENT Left  02/20/2020   Procedure: INSERTION OF ARTERIOVENOUS (AV) GORE-TEX GRAFT LEFT ARM ( FOREARM LOOP );  Surgeon: Renford Dills, MD;  Location: [facility];  Service: Vascular;  Laterality: Left;   CATARACT EXTRACTION W/PHACO Left 11/01/2018   Procedure: CATARACT EXTRACTION PHACO AND INTRAOCULAR LENS PLACEMENT (IOC)-LEFT, DIABETIC-INSULIN DEPENDENT;  Surgeon: Nevada Crane, MD;  Location: [facility];  Service: Ophthalmology;  Laterality: Left;  [place] 00:41.8 CDE 4.61 Fluid Pack Lot # [id number]   CATARACT EXTRACTION W/PHACO Right 04/27/2019   Procedure: CATARACT EXTRACTION PHACO AND INTRAOCULAR LENS PLACEMENT (IOC);  Surgeon: Nevada Crane, MD;  Location: [facility];  Service: Ophthalmology;  Laterality: Right;  [place] 00:34 CDE 1.97 FLUID PACK LOT # [id number]    COLONOSCOPY WITH PROPOFOL N/A 05/12/2020   Procedure: COLONOSCOPY WITH PROPOFOL;  Surgeon: Toledo, Boykin Nearing, MD;  Location: [facility];  Service: Gastroenterology;  Laterality: N/A;   DIALYSIS/PERMA CATHETER INSERTION N/A 02/23/2019   Procedure: DIALYSIS/PERMA CATHETER INSERTION;  Surgeon: Annice Needy, MD;  Location: [facility];  Service: Cardiovascular;  Laterality: N/A;    DIALYSIS/PERMA CATHETER REMOVAL N/A 04/03/2020   Procedure: DIALYSIS/PERMA CATHETER REMOVAL;  Surgeon: Renford Dills, MD;  Location: [facility];  Service: Cardiovascular;  Laterality: N/A;   DIALYSIS/PERMA CATHETER REMOVAL N/A 02/03/2022   Procedure: DIALYSIS/PERMA CATHETER REMOVAL;  Surgeon: Renford Dills, MD;  Location: [facility];  Service: Cardiovascular;  Laterality: N/A;   I & D EXTREMITY Left 10/11/2017   Procedure: IRRIGATION AND DEBRIDEMENT EXTREMITY;  Surgeon: Renford Dills, MD;  Location: [facility];  Service: Vascular;  Laterality: Left;   INCISION AND DRAINAGE ABSCESS Right 10/07/2017   Procedure: INCISION AND DRAINAGE ABSCESS;  Surgeon: Renford Dills, MD;  Location: [facility];  Service: Vascular;  Laterality: Right;   INCISION AND DRAINAGE OF WOUND Left 11/17/2022   Procedure: DEBRIDEMENT WOUND;  Surgeon: Renford Dills, MD;  Location: [facility];  Service: Vascular;  Laterality: Left;   INCISION AND DRAINAGE OF WOUND Left 11/24/2022   Procedure: DEBRIDEMENT WOUND LEFT LOWER EXTREMITY;  Surgeon: Renford Dills, MD;  Location: [facility];  Service: Vascular;  Laterality: Left;   IRRIGATION AND DEBRIDEMENT ABSCESS Left 10/03/2017   Procedure: IRRIGATION AND DEBRIDEMENT ABSCESS with debridement of skin, soft tissue, muscle 50sq cm;  Surgeon: Annice Needy, MD;  Location: [facility];  Service: General;  Laterality: Left;   LOWER EXTREMITY ANGIOGRAPHY Left 10/15/2022   Procedure: Lower Extremity Angiography;  Surgeon: Renford Dills, MD;  Location: [facility];  Service: Cardiovascular;  Laterality: Left;   TEE WITHOUT CARDIOVERSION N/A 03/29/2023   Procedure: TRANSESOPHAGEAL ECHOCARDIOGRAM;  Surgeon: Antonieta Iba, MD;  Location: [facility];  Service: Cardiovascular;  Laterality: N/A;   TEMPORARY DIALYSIS CATHETER  02/20/2019   Procedure: TEMPORARY DIALYSIS CATHETER;  Surgeon: Renford Dills, MD;  Location: [facility];  Service:  Cardiovascular;;   UPPER EXTREMITY ANGIOGRAPHY Left 09/18/2019   Procedure: UPPER EXTREMITY ANGIOGRAPHY;  Surgeon: Renford Dills, MD;  Location: [facility];  Service: Cardiovascular;  Laterality: Left;   WOUND DEBRIDEMENT Left 11/10/2022   Procedure: DEBRIDEMENT WOUND;  Surgeon: Renford Dills, MD;  Location: [facility];  Service: Vascular;  Laterality: Left;   WOUND DEBRIDEMENT Left 11/12/2022   Procedure: DEBRIDEMENT WOUND;  Surgeon: Renford Dills, MD;  Location: [facility];  Service: Vascular;  Laterality: Left;   WOUND DEBRIDEMENT Left 01/06/2023   Procedure: DEBRIDEMENT WOUND;  Surgeon: Renford Dills,  MD;  Location: [facility];  Service: Vascular;  Laterality: Left;    Social History:  reports that he has quit smoking. His smoking use included cigars. He has been exposed to tobacco smoke. He quit smokeless tobacco use about 6 years ago. He reports current alcohol use of about 1.0 standard drink of alcohol per week. He reports that he does not currently use drugs.  Family History:  Family History  Problem Relation Age of Onset   Diabetes Father    Kidney disease Father    Diabetes Daughter      Prior to Admission medications   Medication Sig Start Date End Date Taking? Authorizing Provider  ACCU-CHEK GUIDE test strip USE 1 STRIP TOTAL 2 TIMES DAILY FOR 90 DAYS 01/10/23   [provider]  acetaminophen (TYLENOL) 325 MG tablet Take 2 tablets (650 mg total) by mouth every 6 (six) hours as needed for mild pain (or Fever >/= 101). 10/05/22   Lurene Shadow, MD  allopurinol (ZYLOPRIM) 100 MG tablet Take 100 mg by mouth daily.    [provider]  amLODipine (NORVASC) 10 MG tablet Take 5 mg by mouth daily as needed (if bp is 190/90 or higher).     [provider]  apixaban (ELIQUIS) 5 MG TABS tablet Take 1 tablet (5 mg total) by mouth 2 (two) times daily. 11/25/22   Pace, Peggye Ley, NP  atenolol (TENORMIN) 50 MG tablet Take 25 mg by mouth daily as needed  (if bp is 190/90 or higher).     [provider]  atorvastatin (LIPITOR) 80 MG tablet Take 80 mg by mouth at bedtime. 03/15/23   [provider]  B Complex-C-Folic Acid (RENA-VITE RX) 1 MG TABS Take 1 tablet by mouth 3 (three) times a week.  08/30/19   [provider]  calcium acetate (PHOSLO) 667 MG capsule Take 1,334-2,001 mg by mouth See admin instructions. Take 2001 mg by mouth with each meal & take 1334 mg by mouth with each snack. 01/23/19   [provider]  cinacalcet (SENSIPAR) 30 MG tablet Take 30 mg by mouth daily.    [provider]  CINNAMON PO Take 500 mg by mouth 2 (two) times daily.     [provider]  clopidogrel (PLAVIX) 75 MG tablet TAKE 1 TABLET BY MOUTH EVERY DAY 05/23/23   Georgiana Spinner, NP  Continuous Glucose Sensor (DEXCOM G7 SENSOR) MISC USE 1 EACH EVERY 10 (TEN) DAYS 11/17/22   [provider]  doxycycline (VIBRA-TABS) 100 MG tablet Take 100 mg by mouth 2 (two) times daily.    [provider]  Insulin Aspart FlexPen (NOVOLOG) 100 UNIT/ML Inject 5 Units subcutaneously 3 (three) times daily with meals SKIP dose if your sugar before your meal is your sugar is below 100. 08/29/23   [provider]  insulin degludec (TRESIBA) 100 UNIT/ML FlexTouch Pen Inject 30 Units subcutaneously once daily 08/29/23   [provider]  Insulin Glargine (LANTUS SOLOSTAR) 100 UNIT/ML Solostar Pen Inject 20 Units into the skin at bedtime.    [provider]  lidocaine-prilocaine (EMLA) cream Apply 1 application topically as needed (port access).    [provider]  Melatonin 5 MG CAPS Take 1 capsule by mouth at bedtime as needed. Patient not taking: Reported on 09/16/2023    [provider]  Multiple Vitamin (MULTIVITAMIN WITH MINERALS) TABS tablet Take 1 tablet by mouth 3 (three) times a week. Men's Multivitamin    [provider]  Multiple Vitamins-Minerals (EYE HEALTH PO)  Take 1 capsule by mouth daily.     [provider]  Polyethyl Glycol-Propyl Glycol (SYSTANE OP) Place 1 drop into both eyes daily as needed (dry eyes).    [provider]  sildenafil (REVATIO) 20 MG tablet Take 1 tablet daily.  Do not take with nitrates. Patient not taking: Reported on 09/16/2023 01/17/23   Michiel Cowboy A, PA-C  traZODone (DESYREL) 50 MG tablet Take 50 mg by mouth at bedtime. 11/04/22   [provider]    Physical Exam: Vitals:   12/12/23 1951 12/12/23 2354 12/13/23 0009 12/13/23 0048  BP: (!) 152/77   (!) 128/101  Pulse: 62 70 66   Resp: 20  18 18   Temp: 97.7 F (36.5 C)     TempSrc: Oral     SpO2: 97% 95% 96% 94%   General: Not in acute distress HEENT:       Eyes: PERRL, EOMI, no jaundice       ENT: No discharge from the ears and nose, no pharynx injection, no tonsillar enlargement.        Neck: No JVD, no bruit, no mass felt. Heme: No neck lymph node enlargement. Cardiac: S1/S2, RRR, No murmurs, No gallops or rubs. Respiratory: No rales, wheezing, rhonchi or rubs. GI: Soft, nondistended, nontender, no rebound pain, no organomegaly, BS present. GU: No hematuria Ext: No pitting leg edema bilaterally. 1+DP/PT pulse bilaterally. Has no thrill over AV fistula in left arm Musculoskeletal: No joint deformities, No joint redness or warmth, no limitation of ROM in spin. Skin: No rashes.  Neuro: Alert, oriented X3, cranial nerves II-XII grossly intact, moves all extremities normally. Psych: Patient is not psychotic, no suicidal or hemocidal ideation.  Labs on Admission: I have personally reviewed following labs and imaging studies  CBC: Recent Labs  Lab 12/12/23 1518  WBC 6.3  NEUTROABS 4.4  HGB 13.8  HCT 42.2  MCV 101.0*  PLT 147*   Basic Metabolic Panel: Recent Labs  Lab 12/12/23 1518  NA 141  K 5.0  CL 104  CO2 20*  GLUCOSE 116*  BUN 115*  CREATININE 21.60*  CALCIUM 9.2   GFR: CrCl cannot be calculated (Unknown ideal  weight.). Liver Function Tests: Recent Labs  Lab 12/12/23 1518  AST 9*  ALT 13  ALKPHOS 51  BILITOT 1.0  PROT 7.5  ALBUMIN 3.9   No results for input(s): "LIPASE", "AMYLASE" in the last 168 hours. No results for input(s): "AMMONIA" in the last 168 hours. Coagulation Profile: Recent Labs  Lab 12/12/23 2355  INR 1.2   Cardiac Enzymes: No results for input(s): "CKTOTAL", "CKMB", "CKMBINDEX", "TROPONINI" in the last 168 hours. BNP (last 3 results) No results for input(s): "PROBNP" in the last 8760 hours. HbA1C: No results for input(s): "HGBA1C" in the last 72 hours. CBG: Recent Labs  Lab 12/12/23 2144  GLUCAP 99   Lipid Profile: No results for input(s): "CHOL", "HDL", "LDLCALC", "TRIG", "CHOLHDL", "LDLDIRECT" in the last 72 hours. Thyroid Function Tests: No results for input(s): "TSH", "T4TOTAL", "FREET4", "T3FREE", "THYROIDAB" in the last 72 hours. Anemia Panel: No results for input(s): "VITAMINB12", "FOLATE", "FERRITIN", "TIBC", "IRON", "RETICCTPCT" in the last 72 hours. Urine analysis:    Component Value Date/Time   COLORURINE YELLOW (A) 10/03/2017 1120   APPEARANCEUR CLOUDY (A) 10/03/2017 1120   LABSPEC 1.015 10/03/2017 1120   PHURINE 5.0 10/03/2017 1120   GLUCOSEU NEGATIVE 10/03/2017 1120   HGBUR SMALL (A) 10/03/2017 1120   BILIRUBINUR NEGATIVE 10/03/2017  1120   KETONESUR NEGATIVE 10/03/2017 1120   PROTEINUR 30 (A) 10/03/2017 1120   NITRITE NEGATIVE 10/03/2017 1120   LEUKOCYTESUR NEGATIVE 10/03/2017 1120   Sepsis Labs: @LABRCNTIP (procalcitonin:4,lacticidven:4) )No results found for this or any previous visit (from the past 240 hours).   Radiological Exams on Admission:   Assessment/Plan Principal Problem:   AV fistula occlusion (HCC) Active Problems:   End-stage renal disease (HCC)   HTN (hypertension)   Gout   HLD (hyperlipidemia)   COPD (chronic obstructive pulmonary disease) (HCC)   PVD (peripheral vascular disease) (HCC)   Obesity, Class III,  BMI 40-49.9 (morbid obesity) (HCC)   AV fistula occlusion, initial encounter (HCC)   Assessment and Plan:  AV fistula occlusion (HCC): consulted VVS NP, Sheppard Plumber (with Dr. Gilda Crease), planning to do fistulogram in the morning.  -Place a MedSurg bed for observation - N.p.o. after midnight - Switch Eliquis to IV heparin  Hypertension: 182/95 --> 128/101 -IV hydralazine as needed -pt is not taking amlodipine and atenolol   End stage renal disease on HD (TTS): K 5.0 -Consulted Dr. Thedore Mins of renal for dialysis -will give 10 g of Lokelma per Dr. Thedore Mins   Type 1 diabetes mellitus with renal complication: Recent A1c 8.8, poorly controlled.  Patient is taking Tresiba 35 units daily -Glargine insulin 20 units daily  -Sliding scale insulin   Gout -Allopurinol   HLD (hyperlipidemia) -Lipitor   COPD (chronic obstructive pulmonary disease) (HCC): Stable -Bronchodilators as needed   PVD (peripheral vascular disease) (HCC) -Continue Lipitor - switch Eliquis to IV Heparin -hold Plavix temporarily   Obesity, Class III, BMI 40-49.9 (morbid obesity) (HCC): Body weight 177.4 kg, BMI 47 -Exercise and healthy diet -Encouraged losing weight        DVT ppx: on IV Heparin      Code Status: Full code    Family Communication:   Yes, patient's wife at bed side.    Disposition Plan:  Anticipate discharge back to previous environment  Consults called:  Dr. Thedore Mins of renal and  and VVS NP, Sheppard Plumber (with Dr. Gilda Crease) are consulted.  Admission status and Level of care: Med-Surg:    for obs     Dispo: The patient is from: Home              Anticipated d/c is to: Home              Anticipated d/c date is: 1 day              Patient currently is not medically stable to d/c.    Severity of Illness:  The appropriate patient status for this patient is OBSERVATION. Observation status is judged to be reasonable and necessary in order to provide the required intensity of service to  ensure the patient's safety. The patient's presenting symptoms, physical exam findings, and initial radiographic and laboratory data in the context of their medical condition is felt to place them at decreased risk for further clinical deterioration. Furthermore, it is anticipated that the patient will be medically stable for discharge from the hospital within 2 midnights of admission.        Date of Service 12/13/2023    Lorretta Harp [facility]   If 7PM-7AM, please contact night-coverage [email or web address] 12/13/2023, 1:23 AM

## 2023-12-13 ENCOUNTER — Encounter
Admission: EM | Disposition: A | Discharge status: Home / Self Care | Payer: Self-pay | Source: Home / Self Care | Attending: Osteopathic Medicine

## 2023-12-13 ENCOUNTER — Encounter: Payer: Self-pay | Admitting: Vascular Surgery

## 2023-12-13 DIAGNOSIS — M109 Gout, unspecified: Secondary | ICD-10-CM | POA: Diagnosis present

## 2023-12-13 DIAGNOSIS — Z91040 Latex allergy status: Secondary | ICD-10-CM | POA: Diagnosis not present

## 2023-12-13 DIAGNOSIS — E8770 Fluid overload, unspecified: Secondary | ICD-10-CM | POA: Diagnosis present

## 2023-12-13 DIAGNOSIS — Z87891 Personal history of nicotine dependence: Secondary | ICD-10-CM | POA: Diagnosis not present

## 2023-12-13 DIAGNOSIS — E1022 Type 1 diabetes mellitus with diabetic chronic kidney disease: Secondary | ICD-10-CM | POA: Diagnosis present

## 2023-12-13 DIAGNOSIS — Z7902 Long term (current) use of antithrombotics/antiplatelets: Secondary | ICD-10-CM | POA: Diagnosis not present

## 2023-12-13 DIAGNOSIS — E1051 Type 1 diabetes mellitus with diabetic peripheral angiopathy without gangrene: Secondary | ICD-10-CM | POA: Diagnosis present

## 2023-12-13 DIAGNOSIS — N186 End stage renal disease: Secondary | ICD-10-CM | POA: Diagnosis not present

## 2023-12-13 DIAGNOSIS — E785 Hyperlipidemia, unspecified: Secondary | ICD-10-CM | POA: Diagnosis present

## 2023-12-13 DIAGNOSIS — Z6841 Body Mass Index (BMI) 40.0 and over, adult: Secondary | ICD-10-CM | POA: Diagnosis not present

## 2023-12-13 DIAGNOSIS — Z794 Long term (current) use of insulin: Secondary | ICD-10-CM | POA: Diagnosis not present

## 2023-12-13 DIAGNOSIS — E1065 Type 1 diabetes mellitus with hyperglycemia: Secondary | ICD-10-CM | POA: Diagnosis present

## 2023-12-13 DIAGNOSIS — N2581 Secondary hyperparathyroidism of renal origin: Secondary | ICD-10-CM | POA: Diagnosis present

## 2023-12-13 DIAGNOSIS — I890 Lymphedema, not elsewhere classified: Secondary | ICD-10-CM | POA: Diagnosis present

## 2023-12-13 DIAGNOSIS — E66813 Obesity, class 3: Secondary | ICD-10-CM | POA: Diagnosis present

## 2023-12-13 DIAGNOSIS — T82898A Other specified complication of vascular prosthetic devices, implants and grafts, initial encounter: Secondary | ICD-10-CM | POA: Diagnosis present

## 2023-12-13 DIAGNOSIS — I120 Hypertensive chronic kidney disease with stage 5 chronic kidney disease or end stage renal disease: Secondary | ICD-10-CM | POA: Diagnosis present

## 2023-12-13 DIAGNOSIS — D631 Anemia in chronic kidney disease: Secondary | ICD-10-CM | POA: Diagnosis present

## 2023-12-13 DIAGNOSIS — J449 Chronic obstructive pulmonary disease, unspecified: Secondary | ICD-10-CM | POA: Diagnosis present

## 2023-12-13 DIAGNOSIS — E10319 Type 1 diabetes mellitus with unspecified diabetic retinopathy without macular edema: Secondary | ICD-10-CM | POA: Diagnosis present

## 2023-12-13 DIAGNOSIS — Z7901 Long term (current) use of anticoagulants: Secondary | ICD-10-CM | POA: Diagnosis not present

## 2023-12-13 DIAGNOSIS — Y832 Surgical operation with anastomosis, bypass or graft as the cause of abnormal reaction of the patient, or of later complication, without mention of misadventure at the time of the procedure: Secondary | ICD-10-CM | POA: Diagnosis present

## 2023-12-13 DIAGNOSIS — T82868A Thrombosis of vascular prosthetic devices, implants and grafts, initial encounter: Secondary | ICD-10-CM | POA: Diagnosis present

## 2023-12-13 DIAGNOSIS — T82590A Other mechanical complication of surgically created arteriovenous fistula, initial encounter: Secondary | ICD-10-CM

## 2023-12-13 DIAGNOSIS — Z992 Dependence on renal dialysis: Secondary | ICD-10-CM

## 2023-12-13 DIAGNOSIS — Z79899 Other long term (current) drug therapy: Secondary | ICD-10-CM | POA: Diagnosis not present

## 2023-12-13 HISTORY — PX: TEMPORARY DIALYSIS CATHETER: CATH118312

## 2023-12-13 LAB — CBC
HCT: 37.8 % — ABNORMAL LOW (ref 39.0–52.0)
Hemoglobin: 12.3 g/dL — ABNORMAL LOW (ref 13.0–17.0)
MCH: 33.3 pg (ref 26.0–34.0)
MCHC: 32.5 g/dL (ref 30.0–36.0)
MCV: 102.4 fL — ABNORMAL HIGH (ref 80.0–100.0)
Platelets: 112 10*3/uL — ABNORMAL LOW (ref 150–400)
RBC: 3.69 MIL/uL — ABNORMAL LOW (ref 4.22–5.81)
RDW: 14.5 % (ref 11.5–15.5)
WBC: 4.8 10*3/uL (ref 4.0–10.5)
nRBC: 0 % (ref 0.0–0.2)

## 2023-12-13 LAB — BASIC METABOLIC PANEL WITH GFR
Anion gap: 15 (ref 5–15)
BUN: 124 mg/dL — ABNORMAL HIGH (ref 6–20)
CO2: 22 mmol/L (ref 22–32)
Calcium: 8.7 mg/dL — ABNORMAL LOW (ref 8.9–10.3)
Chloride: 106 mmol/L (ref 98–111)
Creatinine, Ser: 22.51 mg/dL — ABNORMAL HIGH (ref 0.61–1.24)
GFR, Estimated: 2 mL/min — ABNORMAL LOW (ref >60)
Glucose, Bld: 183 mg/dL — ABNORMAL HIGH (ref 70–99)
Potassium: 5.2 mmol/L — ABNORMAL HIGH (ref 3.5–5.1)
Sodium: 143 mmol/L (ref 135–145)

## 2023-12-13 LAB — GLUCOSE, CAPILLARY
Glucose-Capillary: 57 mg/dL — ABNORMAL LOW (ref 70–99)
Glucose-Capillary: 81 mg/dL (ref 70–99)

## 2023-12-13 LAB — PROTIME-INR
INR: 1.2 (ref 0.8–1.2)
Prothrombin Time: 15.3 s — ABNORMAL HIGH (ref 11.4–15.2)

## 2023-12-13 LAB — CBG MONITORING, ED
Glucose-Capillary: 139 mg/dL — ABNORMAL HIGH (ref 70–99)
Glucose-Capillary: 81 mg/dL (ref 70–99)

## 2023-12-13 LAB — HEPATITIS B SURFACE ANTIGEN: Hepatitis B Surface Ag: NONREACTIVE

## 2023-12-13 LAB — HEPARIN LEVEL (UNFRACTIONATED)
Heparin Unfractionated: 0.59 [IU]/mL (ref 0.30–0.70)
Heparin Unfractionated: 0.27 [IU]/mL — ABNORMAL LOW (ref 0.30–0.70)
Heparin Unfractionated: 0.56 [IU]/mL (ref 0.30–0.70)

## 2023-12-13 LAB — HIV ANTIBODY (ROUTINE TESTING W REFLEX): HIV Screen 4th Generation wRfx: NONREACTIVE

## 2023-12-13 LAB — APTT: aPTT: 66 s — ABNORMAL HIGH (ref 24–36)

## 2023-12-13 SURGERY — TEMPORARY DIALYSIS CATHETER
Anesthesia: LOCAL

## 2023-12-13 MED ORDER — CALCIUM ACETATE (PHOS BINDER) 667 MG PO CAPS
2001.0000 mg | ORAL_CAPSULE | Freq: Three times a day (TID) | ORAL | Status: DC
Start: 2023-12-13 — End: 2023-12-16
  Administered 2023-12-13 – 2023-12-16 (×5): 2001 mg via ORAL
  Filled 2023-12-13 (×9): qty 3

## 2023-12-13 MED ORDER — CHLORHEXIDINE GLUCONATE CLOTH 2 % EX PADS
6.0000 | MEDICATED_PAD | Freq: Every day | CUTANEOUS | Status: DC
  Administered 2023-12-14 – 2023-12-16 (×3): 6 via TOPICAL

## 2023-12-13 MED ORDER — CALCIUM ACETATE (PHOS BINDER) 667 MG PO CAPS: 1334.0000 mg | ORAL_CAPSULE | ORAL | Status: DC | PRN

## 2023-12-13 MED ORDER — ADULT MULTIVITAMIN W/MINERALS CH
1.0000 | ORAL_TABLET | ORAL | Status: DC
  Administered 2023-12-16: 1 via ORAL
  Filled 2023-12-13 (×2): qty 1

## 2023-12-13 MED ORDER — INSULIN GLARGINE-YFGN 100 UNIT/ML ~~LOC~~ SOLN
20.0000 [IU] | Freq: Every day | SUBCUTANEOUS | Status: DC
  Administered 2023-12-13 – 2023-12-16 (×4): 20 [IU] via SUBCUTANEOUS
  Filled 2023-12-13 (×4): qty 0.2

## 2023-12-13 MED ORDER — HEPARIN BOLUS VIA INFUSION
1900.0000 [IU] | Freq: Once | INTRAVENOUS | Status: AC
  Administered 2023-12-13: 1900 [IU] via INTRAVENOUS
  Filled 2023-12-13: qty 1900

## 2023-12-13 MED ORDER — ALLOPURINOL 100 MG PO TABS
100.0000 mg | ORAL_TABLET | Freq: Every day | ORAL | Status: DC
  Administered 2023-12-13 – 2023-12-16 (×4): 100 mg via ORAL
  Filled 2023-12-13 (×4): qty 1

## 2023-12-13 MED ORDER — CALCIUM ACETATE (PHOS BINDER) 667 MG PO CAPS
1334.0000 mg | ORAL_CAPSULE | ORAL | Status: DC
Start: 2023-12-13 — End: 2023-12-13

## 2023-12-13 MED ORDER — RENA-VITE PO TABS
1.0000 | ORAL_TABLET | ORAL | Status: DC
  Administered 2023-12-14 – 2023-12-16 (×2): 1 via ORAL
  Filled 2023-12-13 (×2): qty 1

## 2023-12-13 MED ORDER — AMLODIPINE BESYLATE 5 MG PO TABS: 5.0000 mg | ORAL_TABLET | Freq: Every day | ORAL | Status: DC | PRN

## 2023-12-13 MED ORDER — CINACALCET HCL 30 MG PO TABS
30.0000 mg | ORAL_TABLET | Freq: Every day | ORAL | Status: DC
  Administered 2023-12-13 – 2023-12-16 (×4): 30 mg via ORAL
  Filled 2023-12-13 (×4): qty 1

## 2023-12-13 MED ORDER — ATORVASTATIN CALCIUM 20 MG PO TABS
80.0000 mg | ORAL_TABLET | Freq: Every day | ORAL | Status: DC
  Administered 2023-12-13 – 2023-12-15 (×3): 80 mg via ORAL
  Filled 2023-12-13 (×3): qty 4

## 2023-12-13 MED ORDER — PROSIGHT PO TABS
1.0000 | ORAL_TABLET | Freq: Every day | ORAL | Status: DC
  Administered 2023-12-13 – 2023-12-16 (×4): 1 via ORAL
  Filled 2023-12-13 (×4): qty 1

## 2023-12-13 MED ORDER — LIDOCAINE HCL (PF) 1 % IJ SOLN
INTRAMUSCULAR | Status: DC | PRN
  Administered 2023-12-13: 5 mL

## 2023-12-13 MED ORDER — HEPARIN SODIUM (PORCINE) 1000 UNIT/ML DIALYSIS: 1000.0000 [IU] | INTRAMUSCULAR | Status: DC | PRN

## 2023-12-13 MED ORDER — ALTEPLASE 2 MG IJ SOLR: 2.0000 mg | Freq: Once | INTRAMUSCULAR | Status: DC | PRN

## 2023-12-13 SURGICAL SUPPLY — 3 items
COVER PROBE ULTRASOUND 5X96 (MISCELLANEOUS) IMPLANT
GOWN STRL REUS W/ TWL LRG LVL3 (GOWN DISPOSABLE) ×1 IMPLANT
KIT DIALYSIS CATH TRI 30X13 (CATHETERS) IMPLANT

## 2023-12-13 NOTE — Progress Notes (Signed)
  OPERATIVE NOTE   PROCEDURE: Ultrasound guidance for vascular access right femoral vein Placement of a 30 cm triple lumen dialysis catheter right femoral vein  PRE-OPERATIVE DIAGNOSIS: 1. Nonfunctional access, end stage renal failure 2. uremia   POST-OPERATIVE DIAGNOSIS: Same  SURGEON: Mikki Alexander, MD  ASSISTANT(S): None  ANESTHESIA: local  ESTIMATED BLOOD LOSS: Minimal   FINDING(S): 1.  None  SPECIMEN(S):  None  INDICATIONS:    Patient is a 53 y.o.male who presents with a nonfunctional arm access and uremia and needs a catheter for dialysis. .  Risks and benefits were discussed, and informed consent was obtained..  DESCRIPTION: After obtaining full informed written consent, the patient was laid flat in the bed.  The right groin was sterilely prepped and draped in a sterile surgical field was created. The right femoral vein was visualized with ultrasound and found to be widely patent. It was then accessed under direct guidance without difficulty with a Seldinger needle and a permanent image was recorded. A J-wire was then placed. After skin nick and dilatation, a 30 cm triple lumen  dialysis catheter was placed over the wire and the wire was removed. The lumens withdrew dark red nonpulsatile blood and flushed easily with sterile saline. The catheter was secured to the skin with 3 nylon sutures. Sterile dressing was placed.  COMPLICATIONS: None  CONDITION: Stable  Mikki Alexander 12/13/2023 9:52 PM  This note was created with Dragon Medical transcription system. Any errors in dictation are purely unintentional.

## 2023-12-13 NOTE — Progress Notes (Signed)
 PHARMACY - ANTICOAGULATION CONSULT NOTE  Pharmacy Consult for heparin infusion Indication: h/o PVD  Allergies  Allergen Reactions   Lisinopril Swelling    Lips mouth    Furosemide Cough    Light headed, blurry vision, weakness.   Latex Itching   Silicone Itching   Adhesive [Tape] Itching and Rash    Patient Measurements: 178.7 kg (recorded 11/30/23) HDW: 129.7 kg    Vital Signs: Temp: 97.7 F (36.5 C) (04/15 0918) Temp Source: Oral (04/15 0918) BP: 188/80 (04/15 0918) Pulse Rate: 59 (04/15 0918)  Labs: Recent Labs    12/12/23 1518 12/12/23 2355 12/13/23 0432 12/13/23 1036  HGB 13.8  --  12.3*  --   HCT 42.2  --  37.8*  --   PLT 147*  --  112*  --   APTT 35  --   --  66*  LABPROT  --  15.3*  --   --   INR  --  1.2  --   --   HEPARINUNFRC  --  0.59  --  0.27*  CREATININE 21.60*  --  22.51*  --     CrCl cannot be calculated (Unknown ideal weight.).   Medical History: Past Medical History:  Diagnosis Date   Anemia    Chronic kidney disease    14% FUNCTION   Diabetes mellitus without complication (HCC)    Diabetic retinopathy (HCC)    Dyspnea    DOE   Dysrhythmia    End stage renal disease (HCC)    History of orthopnea    error wrong patietn   Hypercholesteremia    error   Hyperkalemia    error wrong patient   Hypertension    Long-term insulin use (HCC)    Lymphedema of leg    Metabolic encephalopathy    error   Microalbuminuria    MSSA (methicillin susceptible Staphylococcus aureus)    Obesity    PVD (peripheral vascular disease) (HCC)    Respiratory failure (HCC)    Error   Sepsis (HCC)    error   Sleep apnea    no CPAP   UTI (urinary tract infection)    error   Venous ulcer (HCC)    Vitamin D deficiency     Assessment: 53 y.o. male with medical history significant of  ESRD-HD (TTS), HTN. HLD, DM, COPD, PVD on Eliquis, gout, endocarditis, lymphedema, obesity, who presents with AV fistula occlusion in left arm. Platelets LEN at  baseline. Last dose of apixaban 12/11/23 am   Goal of Therapy:  anti-Xa level 0.3-0.7 units/ml aPTT 66 - 102 seconds Monitor platelets by anticoagulation protocol: Yes   4/15 @ 1036:  HL 0.27, aPTT 66 Subtherapeutic and correlating  Plan: Give 1900 units bolus x1; then increase rate of heparin infusion to 1800 units/hour. Check heparin level in  6 hours, then daily once at least two levels are consecutively therapeutic. Continue to monitor CBC daily while on heparin infusion.   Will M. Alva Jewels, PharmD Clinical Pharmacist 12/13/2023 11:22 AM

## 2023-12-13 NOTE — Progress Notes (Signed)
 Hemodialysis Note:  Received patient in bed to unit. Alert and oriented. Informed consent singed and in chart.  Treatment initiated: 1542 Treatment completed: 1842  Access used: Right femoral catheter Access issues: None  Patient requested to end treatment 41 minutes early because he really wants to go to the bathroom. AMA form signed by the patient.Transported back to room, alert without acute distress. Report given to patient's RN.  Total UF removed: 2 liters Medications given: None  Post HD weight: 185.5 Kg  [name]

## 2023-12-13 NOTE — Progress Notes (Signed)
 Progress Note   Patient: [name] ZOX:[id number] DOB: 10-Jan-1971 DOA: 12/12/2023     0 DOS: the patient was seen and examined on 12/13/2023   Brief hospital course:  This is a 53 y.o. male with medical history significant of  ESRD-HD (TTS), HTN. HLD, DM, COPD, PVD on Eliquis, gout, endocarditis, lymphedema, obesity, who presents with AV fistula occlusion in left arm. Temporary dialysis catheter placed today via Vascular Surgery. Plan to   undergo dialysis tonight prior to obtaining fistulogram for further evaluation.    Assessment and Plan: No notes have been filed under this hospital service. Service: Hospitalist AV fistula occlusion [facility]): consulted VVS NP, Sheppard Plumber (with Dr. Gilda Crease), planning to do fistulogram following dialysis.  - Switch Eliquis to IV heparin pharmacy to dose    Hypertension:   -IV hydralazine as needed. BP has been labile, ranging from 120-188 systolic.  - resume amlodipine if needed following dialysis.  [name] He has been prescribed atenolol but does not take it currently    End stage renal disease on HD (TTS):  -Consulted Dr. Thedore Mins of renal for dialysis later today now that temporary dialysis access obtained.  Nephrology assistance appreciated  Anticipate daily treatments to manage fluid volume    Type 1 diabetes mellitus with renal complication: Recent A1c 8.8, poorly controlled.  Patient is taking Tresiba 35 units daily -Glargine insulin 20 units daily  -Sliding scale insulin Consistent carb diet    Gout -Allopurinol   HLD (hyperlipidemia) -Lipitor   COPD (chronic obstructive pulmonary disease) (HCC): Stable -Bronchodilators as needed   PVD (peripheral vascular disease) (HCC) -Continue Lipitor - switch Eliquis to IV Heparin -hold Plavix temporarily   Obesity, Class III, BMI 40-49.9 (morbid obesity) (HCC): Body weight 177.4 kg, BMI 47 -Exercise and healthy diet -Encouraged losing weight           DVT ppx: on IV Heparin      consistent  carb diet  No IVF  Monitor/replace electrolytes  Code Status: Full code     Family Communication:   Yes, patient's wife at bed side.     Disposition Plan:  Anticipate discharge back to previous environment   Consults called:  Dr. Thedore Mins of renal and  and VVS NP, Sheppard Plumber (with Dr. Gilda Crease) are consulted.        Subjective: Patient without complaints this morning, pending fistulogram. Family member at bedside. All questions answered.   Physical Exam: Vitals:   12/13/23 0441 12/13/23 0845 12/13/23 0918 12/13/23 1456  BP: (!) 137/58 120/78 (!) 188/80   Pulse: 70 64 (!) 59   Resp: (!) 23 17 13 20   Temp: 98 F (36.7 C)  97.7 F (36.5 C)   TempSrc:   Oral   SpO2: 100% 98% 96%    Physical Exam Vitals and nursing note reviewed.  Constitutional:      General: He is not in acute distress.    Appearance: He is obese. He is not ill-appearing.  HENT:     Head: Normocephalic and atraumatic.  Eyes:     Pupils: Pupils are equal, round, and reactive to light.  Cardiovascular:     Rate and Rhythm: Normal rate and regular rhythm.  Pulmonary:     Effort: Pulmonary effort is normal.     Breath sounds: Normal breath sounds.  Abdominal:     General: There is no distension.     Palpations: Abdomen is soft.     Tenderness: There is no abdominal tenderness.  Musculoskeletal:  Cervical back: Neck supple.     Right lower leg: Edema present.     Left lower leg: Edema present.     Comments: LLE fistula without palpable thrill. Left hand NVI   Skin:    General: Skin is warm and dry.  Neurological:     General: No focal deficit present.     Mental Status: He is alert and oriented to person, place, and time.  Psychiatric:        Mood and Affect: Mood normal.        Behavior: Behavior normal.     Data Reviewed:   Labs on Admission: I have personally reviewed following labs and imaging studies  CBC: Recent Labs  Lab 12/12/23 1518 12/13/23 0432  WBC 6.3 4.8  NEUTROABS 4.4   --   HGB 13.8 12.3*  HCT 42.2 37.8*  MCV 101.0* 102.4*  PLT 147* 112*   Basic Metabolic Panel: Recent Labs  Lab 12/12/23 1518 12/13/23 0432  NA 141 143  K 5.0 5.2*  CL 104 106  CO2 20* 22  GLUCOSE 116* 183*  BUN 115* 124*  CREATININE 21.60* 22.51*  CALCIUM 9.2 8.7*   GFR: CrCl cannot be calculated (Unknown ideal weight.). Liver Function Tests: Recent Labs  Lab 12/12/23 1518  AST 9*  ALT 13  ALKPHOS 51  BILITOT 1.0  PROT 7.5  ALBUMIN 3.9   No results for input(s): "LIPASE", "AMYLASE" in the last 168 hours. No results for input(s): "AMMONIA" in the last 168 hours. Coagulation Profile: Recent Labs  Lab 12/12/23 2355  INR 1.2   Cardiac Enzymes: No results for input(s): "CKTOTAL", "CKMB", "CKMBINDEX", "TROPONINI" in the last 168 hours. BNP (last 3 results) No results for input(s): "PROBNP" in the last 8760 hours. HbA1C: No results for input(s): "HGBA1C" in the last 72 hours. CBG: Recent Labs  Lab 12/12/23 2144 12/13/23 0717 12/13/23 1200  GLUCAP 99 139* 81   Lipid Profile: No results for input(s): "CHOL", "HDL", "LDLCALC", "TRIG", "CHOLHDL", "LDLDIRECT" in the last 72 hours. Thyroid Function Tests: No results for input(s): "TSH", "T4TOTAL", "FREET4", "T3FREE", "THYROIDAB" in the last 72 hours. Anemia Panel: No results for input(s): "VITAMINB12", "FOLATE", "FERRITIN", "TIBC", "IRON", "RETICCTPCT" in the last 72 hours. Urine analysis:    Component Value Date/Time   COLORURINE YELLOW (A) 10/03/2017 1120   APPEARANCEUR CLOUDY (A) 10/03/2017 1120   LABSPEC 1.015 10/03/2017 1120   PHURINE 5.0 10/03/2017 1120   GLUCOSEU NEGATIVE 10/03/2017 1120   HGBUR SMALL (A) 10/03/2017 1120   BILIRUBINUR NEGATIVE 10/03/2017 1120   KETONESUR NEGATIVE 10/03/2017 1120   PROTEINUR 30 (A) 10/03/2017 1120   NITRITE NEGATIVE 10/03/2017 1120   LEUKOCYTESUR NEGATIVE 10/03/2017 1120    Radiological Exams on Admission: PERIPHERAL VASCULAR CATHETERIZATION Result Date:  12/13/2023 See surgical note for result.       Time spent: 35 minutes  Author: Charlesetta Connors, DO 12/13/2023 2:59 PM  For on call review [email or web address].

## 2023-12-13 NOTE — Progress Notes (Signed)
 [facility]  ROUNDING NOTE   Subjective:   [name] is known to our practice from outpatient dialysis.  He dialyzes at [facility].   Patient presents to the emergency department for evaluation of a clotted dialysis access.  Patient states his last treatment was completed on Tuesday of last week.  Patient states when he went to attend treatment on Thursday, they could not palpate his access.  This is also the reason he did not complete treatment on Saturday.  He states he has attempted to monitor fluid and potassium intake.  Does report shortness of breath on exertion.  States he feels like he has a lot of fluid on.  Labs on ED arrival include potassium 5.0, creatinine 21.6, BUN 115.  We have been consulted to manage dialysis needs during this admission.  Objective:  Vital signs in last 24 hours:  Temp:  [97.7 F (36.5 C)-98 F (36.7 C)] 97.7 F (36.5 C) (04/15 0918) Pulse Rate:  [59-77] 59 (04/15 0918) Resp:  [13-23] 20 (04/15 1456) BP: (120-188)/(58-101) 188/80 (04/15 0918) SpO2:  [94 %-100 %] 96 % (04/15 0918)  Weight change:  There were no vitals filed for this visit.  Intake/Output: No intake/output data recorded.   Intake/Output this shift:  No intake/output data recorded.  Physical Exam: General: NAD  Head: Normocephalic, atraumatic. Moist oral mucosal membranes  Eyes: Anicteric  Lungs:  Clear to auscultation  Heart: Regular rate and rhythm  Abdomen:  Soft, nontender,   Extremities: +++ Peripheral edema.  Neurologic: Nonfocal, moving all four extremities  Skin: No lesions  Access: Left aVF (-bruit or thrill)    Basic Metabolic Panel: Recent Labs  Lab 12/12/23 1518 12/13/23 0432  NA 141 143  K 5.0 5.2*  CL 104 106  CO2 20* 22  GLUCOSE 116* 183*  BUN 115* 124*  CREATININE 21.60* 22.51*  CALCIUM 9.2 8.7*    Liver Function Tests: Recent Labs  Lab 12/12/23 1518  AST 9*  ALT 13  ALKPHOS 51  BILITOT 1.0  PROT 7.5  ALBUMIN 3.9    No results for input(s): "LIPASE", "AMYLASE" in the last 168 hours. No results for input(s): "AMMONIA" in the last 168 hours.  CBC: Recent Labs  Lab 12/12/23 1518 12/13/23 0432  WBC 6.3 4.8  NEUTROABS 4.4  --   HGB 13.8 12.3*  HCT 42.2 37.8*  MCV 101.0* 102.4*  PLT 147* 112*    Cardiac Enzymes: No results for input(s): "CKTOTAL", "CKMB", "CKMBINDEX", "TROPONINI" in the last 168 hours.  BNP: Invalid input(s): "POCBNP"  CBG: Recent Labs  Lab 12/12/23 2144 12/13/23 0717 12/13/23 1200  GLUCAP 99 139* 81    Microbiology: Results for orders placed or performed during the hospital encounter of 03/24/23  Culture, blood (Routine x 2)     Status: Abnormal   Collection Time: 03/24/23  9:41 AM   Specimen: BLOOD  Result Value Ref Range Status   Specimen Description   Final    BLOOD BLOOD RIGHT ARM Performed at [facility], [address], [place], [place] [address]    Special Requests   Final    BOTTLES DRAWN AEROBIC AND ANAEROBIC Blood Culture results may not be optimal due to an excessive volume of blood received in culture bottles Performed at [facility], [address] [place], [place] [address]    Culture  Setup Time   Final    Organism ID to follow GRAM POSITIVE COCCI IN BOTH AEROBIC AND ANAEROBIC BOTTLES CRITICAL  RESULT CALLED TO, READ BACK BY AND VERIFIED WITH: NATHAN BELEU AT 2141 ON 03/24/23 BY SS Performed at [facility], [address] [place], [place] [address]    Culture STREPTOCOCCUS GROUP G (A)  Final   Report Status 03/27/2023 FINAL  Final   Organism ID, Bacteria STREPTOCOCCUS GROUP G  Final      Susceptibility   Streptococcus group g - MIC*    CLINDAMYCIN RESISTANT Resistant     AMPICILLIN <=0.25 SENSITIVE Sensitive     ERYTHROMYCIN >=8 RESISTANT Resistant     VANCOMYCIN 0.5 SENSITIVE Sensitive     CEFTRIAXONE <=0.12 SENSITIVE Sensitive     LEVOFLOXACIN 0.5 SENSITIVE Sensitive     PENICILLIN <=0.06 SENSITIVE  Sensitive     * STREPTOCOCCUS GROUP G  Blood Culture ID Panel (Reflexed)     Status: Abnormal   Collection Time: 03/24/23  9:41 AM  Result Value Ref Range Status   Enterococcus faecalis NOT DETECTED NOT DETECTED Final   Enterococcus Faecium NOT DETECTED NOT DETECTED Final   Listeria monocytogenes NOT DETECTED NOT DETECTED Final   Staphylococcus species NOT DETECTED NOT DETECTED Final   Staphylococcus aureus (BCID) NOT DETECTED NOT DETECTED Final   Staphylococcus epidermidis NOT DETECTED NOT DETECTED Final   Staphylococcus lugdunensis NOT DETECTED NOT DETECTED Final   Streptococcus species DETECTED (A) NOT DETECTED Final    Comment: Not Enterococcus species, Streptococcus agalactiae, Streptococcus pyogenes, or Streptococcus pneumoniae. CRITICAL RESULT CALLED TO, READ BACK BY AND VERIFIED WITH: NATHAN BELUE AT 2141 ON 03/24/23 BY SS    Streptococcus agalactiae NOT DETECTED NOT DETECTED Final   Streptococcus pneumoniae NOT DETECTED NOT DETECTED Final   Streptococcus pyogenes NOT DETECTED NOT DETECTED Final   A.calcoaceticus-baumannii NOT DETECTED NOT DETECTED Final   Bacteroides fragilis NOT DETECTED NOT DETECTED Final   Enterobacterales NOT DETECTED NOT DETECTED Final   Enterobacter cloacae complex NOT DETECTED NOT DETECTED Final   Escherichia coli NOT DETECTED NOT DETECTED Final   Klebsiella aerogenes NOT DETECTED NOT DETECTED Final   Klebsiella oxytoca NOT DETECTED NOT DETECTED Final   Klebsiella pneumoniae NOT DETECTED NOT DETECTED Final   Proteus species NOT DETECTED NOT DETECTED Final   Salmonella species NOT DETECTED NOT DETECTED Final   Serratia marcescens NOT DETECTED NOT DETECTED Final   Haemophilus influenzae NOT DETECTED NOT DETECTED Final   Neisseria meningitidis NOT DETECTED NOT DETECTED Final   Pseudomonas aeruginosa NOT DETECTED NOT DETECTED Final   Stenotrophomonas maltophilia NOT DETECTED NOT DETECTED Final   Candida albicans NOT DETECTED NOT DETECTED Final   Candida  auris NOT DETECTED NOT DETECTED Final   Candida glabrata NOT DETECTED NOT DETECTED Final   Candida krusei NOT DETECTED NOT DETECTED Final   Candida parapsilosis NOT DETECTED NOT DETECTED Final   Candida tropicalis NOT DETECTED NOT DETECTED Final   Cryptococcus neoformans/gattii NOT DETECTED NOT DETECTED Final    Comment: Performed at [facility] [address] [place], [place] [address]  Culture, blood (Routine x 2)     Status: Abnormal   Collection Time: 03/24/23  9:55 AM   Specimen: BLOOD RIGHT ARM  Result Value Ref Range Status   Specimen Description   Final    BLOOD RIGHT ARM Performed at [facility], [address], [place], [place] [address]    Special Requests   Final    BOTTLES DRAWN AEROBIC AND ANAEROBIC Blood Culture adequate volume Performed at [facility], [address] [place], [place] [address]    Culture  Setup Time   Final    GRAM POSITIVE COCCI IN BOTH AEROBIC AND ANAEROBIC BOTTLES CRITICAL VALUE NOTED.  VALUE IS CONSISTENT WITH PREVIOUSLY REPORTED AND CALLED VALUE. Performed at [facility], [address] [place], [place] [address]    Culture (A)  Final    STREPTOCOCCUS GROUP G SUSCEPTIBILITIES PERFORMED ON PREVIOUS CULTURE WITHIN THE LAST 5 DAYS. Performed at [facility], [address], [place], [place] [address]    Report Status 03/27/2023 FINAL  Final  Resp panel by RT-PCR (RSV, Flu A&B, Covid) Anterior Nasal Swab     Status: None   Collection Time: 03/24/23  9:55 AM   Specimen: Anterior Nasal Swab  Result Value Ref Range Status   SARS Coronavirus 2 by RT PCR NEGATIVE NEGATIVE Final    Comment: (NOTE) SARS-CoV-2 target nucleic acids are NOT DETECTED.  The SARS-CoV-2 RNA is generally detectable in upper respiratory specimens during the acute phase of infection. The lowest concentration of SARS-CoV-2 viral copies this assay can detect is 138 copies/mL. A negative result does not preclude  SARS-Cov-2 infection and should not be used as the sole basis for treatment or other patient management decisions. A negative result may occur with  improper specimen collection/handling, submission of specimen other than nasopharyngeal swab, presence of viral mutation(s) within the areas targeted by this assay, and inadequate number of viral copies(<138 copies/mL). A negative result must be combined with clinical observations, patient history, and epidemiological information. The expected result is Negative.  Fact Sheet for Patients:  BloggerCourse.com  Fact Sheet for Healthcare Providers:  SeriousBroker.it  This test is no t yet approved or cleared by the [place]  FDA and  has been authorized for detection and/or diagnosis of SARS-CoV-2 by FDA under an Emergency Use Authorization (EUA). This EUA will remain  in effect (meaning this test can be used) for the duration of the COVID-19 declaration under Section 564(b)(1) of the Act, 21 U.S.C.section 360bbb-3(b)(1), unless the authorization is terminated  or revoked sooner.       Influenza A by PCR NEGATIVE NEGATIVE Final   Influenza B by PCR NEGATIVE NEGATIVE Final    Comment: (NOTE) The Xpert Xpress SARS-CoV-2/FLU/RSV plus assay is intended as an aid in the diagnosis of influenza from Nasopharyngeal swab specimens and should not be used as a sole basis for treatment. Nasal washings and aspirates are unacceptable for Xpert Xpress SARS-CoV-2/FLU/RSV testing.  Fact Sheet for Patients: BloggerCourse.com  Fact Sheet for Healthcare Providers: SeriousBroker.it  This test is not yet approved or cleared by the [place]  FDA and has been authorized for detection and/or diagnosis of SARS-CoV-2 by FDA under an Emergency Use Authorization (EUA). This EUA will remain in effect (meaning this test can be used) for the duration of  the COVID-19 declaration under Section 564(b)(1) of the Act, 21 U.S.C. section 360bbb-3(b)(1), unless the authorization is terminated or revoked.     Resp Syncytial Virus by PCR NEGATIVE NEGATIVE Final    Comment: (NOTE) Fact Sheet for Patients: BloggerCourse.com  Fact Sheet for Healthcare Providers: SeriousBroker.it  This test is not yet approved or cleared by the [place]  FDA and has been authorized for detection and/or diagnosis of SARS-CoV-2 by FDA under an Emergency Use Authorization (EUA). This EUA will remain in effect (meaning this test can be used) for the duration of the COVID-19 declaration under Section 564(b)(1) of the Act, 21 U.S.C. section 360bbb-3(b)(1), unless the authorization is terminated or revoked.  Performed at [facility], [phone]  [address] [place], [place] [address]   Aerobic Culture w Gram Stain (superficial specimen)     Status: None   Collection Time: 03/25/23  6:50 PM   Specimen: Wound  Result Value Ref Range Status   Specimen Description   Final    WOUND Performed at [facility], [address] [place], [place] [address]    Special Requests   Final    NONE Performed at [facility], [address] [place], [place] [address]    Gram Stain   Final    FEW WBC PRESENT, PREDOMINANTLY PMN RARE GRAM POSITIVE RODS RARE GRAM POSITIVE COCCI    Culture   Final    FEW ESCHERICHIA COLI MODERATE CORYNEBACTERIUM SPECIES Standardized susceptibility testing for this organism is not available. Performed at [facility], [address] [place], [place] [address]    Report Status 03/29/2023 FINAL  Final   Organism ID, Bacteria ESCHERICHIA COLI  Final      Susceptibility   Escherichia coli - MIC*    AMPICILLIN 4 SENSITIVE Sensitive     CEFEPIME <=0.12 SENSITIVE Sensitive     CEFTAZIDIME <=1 SENSITIVE Sensitive     CEFTRIAXONE <=0.25 SENSITIVE Sensitive      CIPROFLOXACIN 0.5 INTERMEDIATE Intermediate     GENTAMICIN <=1 SENSITIVE Sensitive     IMIPENEM <=0.25 SENSITIVE Sensitive     TRIMETH/SULFA >=320 RESISTANT Resistant     AMPICILLIN/SULBACTAM <=2 SENSITIVE Sensitive     PIP/TAZO <=4 SENSITIVE Sensitive     * FEW ESCHERICHIA COLI  Culture, blood (Routine X 2) w Reflex to ID Panel     Status: None   Collection Time: 03/26/23  5:06 AM   Specimen: BLOOD  Result Value Ref Range Status   Specimen Description BLOOD BLOOD RIGHT ARM  Final   Special Requests   Final    BOTTLES DRAWN AEROBIC AND ANAEROBIC Blood Culture adequate volume   Culture   Final    NO GROWTH 5 DAYS Performed at [facility], [address] [place], [place] [address]    Report Status 03/31/2023 FINAL  Final  Culture, blood (Routine X 2) w Reflex to ID Panel     Status: None   Collection Time: 03/26/23  5:06 AM   Specimen: BLOOD  Result Value Ref Range Status   Specimen Description BLOOD BLOOD RIGHT HAND  Final   Special Requests   Final    BOTTLES DRAWN AEROBIC ONLY Blood Culture adequate volume   Culture   Final    NO GROWTH 5 DAYS Performed at [facility], [address] [place], [place] [address]    Report Status 03/31/2023 FINAL  Final    Coagulation Studies: Recent Labs    12/12/23 2355  LABPROT 15.3*  INR 1.2    Urinalysis: No results for input(s): "COLORURINE", "LABSPEC", "PHURINE", "GLUCOSEU", "HGBUR", "BILIRUBINUR", "KETONESUR", "PROTEINUR", "UROBILINOGEN", "NITRITE", "LEUKOCYTESUR" in the last 72 hours.  Invalid input(s): "APPERANCEUR"    Imaging: PERIPHERAL VASCULAR CATHETERIZATION Result Date: 12/13/2023 See surgical note for result.    Medications:    heparin 1,800 Units/hr (12/13/23 1132)    allopurinol  100 mg Oral Daily   atorvastatin  80 mg Oral QHS   calcium acetate  2,001 mg Oral TID WC   [START ON 12/14/2023] Chlorhexidine Gluconate Cloth  6 each Topical Q0600   cinacalcet  30 mg Oral Q breakfast    insulin aspart  0-5 Units Subcutaneous QHS   insulin aspart  0-9 Units  Subcutaneous TID WC   insulin glargine-yfgn  20 Units Subcutaneous Daily   multivitamin  1 tablet Oral Daily   [START ON 12/14/2023] multivitamin  1 tablet Oral Once per day on Monday Wednesday Friday   [START ON 12/14/2023] multivitamin with minerals  1 tablet Oral Once per day on Monday Wednesday Friday   acetaminophen, albuterol, alteplase, calcium acetate **AND** calcium acetate, dextromethorphan-guaiFENesin, heparin, hydrALAZINE, ondansetron (ZOFRAN) IV  Assessment/ Plan:  Mr. COLLINS KERBY is a 53 y.o.  male  with  ESRD-HD (TTS), HTN. HJLD, DM, COPD, PVD on Eliquis, gout, obesity, chronic ulcer in left leg and foot.   [facility]/TTS/left aVF  Malfunctioning dialysis access, left aVF without bruit or thrill.  Vascular surgery notified, awaiting placement of HD temp catheter.  Vascular to plan for fistulogram at later time.  2.  End-stage renal disease on hemodialysis.  Last treatment completed on Tuesday, 4/8.  Once temp cath placed, will provide hemodialysis treatment.  Patient will likely require daily treatments to manage fluid volume.  3. Anemia of chronic kidney disease Lab Results  Component Value Date   HGB 12.3 (L) 12/13/2023    Hemoglobin within desired range.  No need for ESA's at this time.  4. Secondary Hyperparathyroidism: with outpatient labs: Unavailable  Lab Results  Component Value Date   CALCIUM 8.7 (L) 12/13/2023   CAION 1.14 (L) 11/12/2022   PHOS 6.6 (H) 11/25/2022    Will continue to monitor bone minerals during this admission.  Continue Cinacalcet and calcium acetate as ordered.   LOS: 0 Dezyrae Kensinger 4/15/20252:58 PM

## 2023-12-13 NOTE — Consult Note (Signed)
 Hospital Consult    Reason for Consult:  Non Functioning A/V Fistula in left upper extremity  Requesting Physician:  Dr Fidencio Hue MD  MRN #:  [id number]  History of Present Illness: This is a 53 y.o. male with medical history significant of  ESRD-HD (TTS), HTN. HLD, DM, COPD, PVD on Eliquis, gout, endocarditis, lymphedema, obesity, who presents with AV fistula occlusion in left arm. Pt states that he had dialysis on Tuesday. He went to dialysis on Thursday, and was told that his fistula was clotted and they were unable to use HD.  He did not try to do dialysis on Saturday.  Patient has little dry cough and very mild SOB, no chest pain.  No fever or chills.  No pain over AV fistula area he his left arm.  Vascular surgery was consulted to evaluate.  Past Medical History:  Diagnosis Date   Anemia    Chronic kidney disease    14% FUNCTION   Diabetes mellitus without complication (HCC)    Diabetic retinopathy (HCC)    Dyspnea    DOE   Dysrhythmia    End stage renal disease (HCC)    History of orthopnea    error wrong patietn   Hypercholesteremia    error   Hyperkalemia    error wrong patient   Hypertension    Long-term insulin use (HCC)    Lymphedema of leg    Metabolic encephalopathy    error   Microalbuminuria    MSSA (methicillin susceptible Staphylococcus aureus)    Obesity    PVD (peripheral vascular disease) (HCC)    Respiratory failure (HCC)    Error   Sepsis (HCC)    error   Sleep apnea    no CPAP   UTI (urinary tract infection)    error   Venous ulcer (HCC)    Vitamin D deficiency     Past Surgical History:  Procedure Laterality Date   A/V FISTULAGRAM Right 10/24/2018   Procedure: A/V FISTULAGRAM;  Surgeon: Jackquelyn Mass, MD;  Location: [facility];  Service: Cardiovascular;  Laterality: Right;   A/V FISTULAGRAM Right 11/24/2018   Procedure: A/V FISTULAGRAM;  Surgeon: Jackquelyn Mass, MD;  Location: [facility];  Service:  Cardiovascular;  Laterality: Right;   A/V FISTULAGRAM Left 07/31/2019   Procedure: A/V FISTULAGRAM;  Surgeon: Jackquelyn Mass, MD;  Location: [facility];  Service: Cardiovascular;  Laterality: Left;   A/V FISTULAGRAM Left 12/11/2019   Procedure: A/V FISTULAGRAM;  Surgeon: Jackquelyn Mass, MD;  Location: [facility];  Service: Cardiovascular;  Laterality: Left;   A/V FISTULAGRAM Left 07/08/2023   Procedure: A/V Fistulagram;  Surgeon: Jackquelyn Mass, MD;  Location: [facility];  Service: Cardiovascular;  Laterality: Left;   A/V FISTULAGRAM Left 09/16/2023   Procedure: A/V Fistulagram;  Surgeon: Jackquelyn Mass, MD;  Location: [facility];  Service: Cardiovascular;  Laterality: Left;   A/V SHUNT INTERVENTION Right 02/21/2019   Procedure: A/V SHUNT INTERVENTION;  Surgeon: Jackquelyn Mass, MD;  Location: [facility];  Service: Cardiovascular;  Laterality: Right;   APPLICATION OF WOUND VAC Left 10/03/2017   Procedure: APPLICATION OF WOUND VAC;  Surgeon: Celso College, MD;  Location: [facility];  Service: General;  Laterality: Left;   APPLICATION OF WOUND VAC Left 10/11/2017   Procedure: APPLICATION OF WOUND VAC;  Surgeon: Jackquelyn Mass, MD;  Location: [facility];  Service: Vascular;  Laterality: Left;  APPLICATION OF WOUND VAC Left 10/14/2017   Procedure: WOUND VAC CHANGE;  Surgeon: Renford Dills, MD;  Location: [facility];  Service: Vascular;  Laterality: Left;  left lower leg   APPLICATION OF WOUND VAC Left 10/18/2017   Procedure: WOUND VAC CHANGE;  Surgeon: Renford Dills, MD;  Location: [facility];  Service: Vascular;  Laterality: Left;   APPLICATION OF WOUND VAC Left 10/07/2017   Procedure: APPLICATION OF WOUND VAC;  Surgeon: Renford Dills, MD;  Location: [facility];  Service: Vascular;  Laterality: Left;   APPLICATION OF WOUND VAC Left 11/17/2022   Procedure: Wound vac exchange;  Surgeon: Renford Dills, MD;  Location: [facility];   Service: Vascular;  Laterality: Left;   APPLICATION OF WOUND VAC Left 11/10/2022   Procedure: APPLICATION OF WOUND VAC;  Surgeon: Renford Dills, MD;  Location: [facility];  Service: Vascular;  Laterality: Left;   APPLICATION OF WOUND VAC Left 11/24/2022   Procedure: WOUND VAC REMOVAL;  Surgeon: Renford Dills, MD;  Location: [facility];  Service: Vascular;  Laterality: Left;   APPLICATION OF WOUND VAC Left 11/12/2022   Procedure: APPLICATION OF WOUND VAC;  Surgeon: Renford Dills, MD;  Location: [facility];  Service: Vascular;  Laterality: Left;   APPLICATION OF WOUND VAC Left 01/06/2023   Procedure: APPLICATION OF WOUND VAC;  Surgeon: Renford Dills, MD;  Location: [facility];  Service: Vascular;  Laterality: Left;   AV FISTULA INSERTION W/ RF MAGNETIC GUIDANCE Right 07/19/2018   Procedure: AV FISTULA INSERTION W/RF MAGNETIC GUIDANCE;  Surgeon: Renford Dills, MD;  Location: [facility];  Service: Cardiovascular;  Laterality: Right;   AV FISTULA PLACEMENT Left 05/11/2019   Procedure: ARTERIOVENOUS (AV) FISTULA CREATION (RADIOCEPHALIC );  Surgeon: Renford Dills, MD;  Location: [facility];  Service: Vascular;  Laterality: Left;   AV FISTULA PLACEMENT Left 02/20/2020   Procedure: INSERTION OF ARTERIOVENOUS (AV) GORE-TEX GRAFT LEFT ARM ( FOREARM LOOP );  Surgeon: Renford Dills, MD;  Location: [facility];  Service: Vascular;  Laterality: Left;   CATARACT EXTRACTION W/PHACO Left 11/01/2018   Procedure: CATARACT EXTRACTION PHACO AND INTRAOCULAR LENS PLACEMENT (IOC)-LEFT, DIABETIC-INSULIN DEPENDENT;  Surgeon: Nevada Crane, MD;  Location: [facility];  Service: Ophthalmology;  Laterality: Left;  [place] 00:41.8 CDE 4.61 Fluid Pack Lot # [id number]   CATARACT EXTRACTION W/PHACO Right 04/27/2019   Procedure: CATARACT EXTRACTION PHACO AND INTRAOCULAR LENS PLACEMENT (IOC);  Surgeon: Nevada Crane, MD;  Location: [facility];  Service: Ophthalmology;  Laterality: Right;  [place] 00:34 CDE  1.97 FLUID PACK LOT # [id number]    COLONOSCOPY WITH PROPOFOL N/A 05/12/2020   Procedure: COLONOSCOPY WITH PROPOFOL;  Surgeon: Toledo, Boykin Nearing, MD;  Location: [facility];  Service: Gastroenterology;  Laterality: N/A;   DIALYSIS/PERMA CATHETER INSERTION N/A 02/23/2019   Procedure: DIALYSIS/PERMA CATHETER INSERTION;  Surgeon: Annice Needy, MD;  Location: [facility];  Service: Cardiovascular;  Laterality: N/A;   DIALYSIS/PERMA CATHETER REMOVAL N/A 04/03/2020   Procedure: DIALYSIS/PERMA CATHETER REMOVAL;  Surgeon: Renford Dills, MD;  Location: [facility];  Service: Cardiovascular;  Laterality: N/A;   DIALYSIS/PERMA CATHETER REMOVAL N/A 02/03/2022   Procedure: DIALYSIS/PERMA CATHETER REMOVAL;  Surgeon: Renford Dills, MD;  Location: [facility];  Service: Cardiovascular;  Laterality: N/A;   I & D EXTREMITY Left 10/11/2017   Procedure: IRRIGATION AND DEBRIDEMENT EXTREMITY;  Surgeon: Renford Dills, MD;  Location: [facility];  Service: Vascular;  Laterality: Left;  INCISION AND DRAINAGE ABSCESS Right 10/07/2017   Procedure: INCISION AND DRAINAGE ABSCESS;  Surgeon: Renford Dills, MD;  Location: [facility];  Service: Vascular;  Laterality: Right;   INCISION AND DRAINAGE OF WOUND Left 11/17/2022   Procedure: DEBRIDEMENT WOUND;  Surgeon: Renford Dills, MD;  Location: [facility];  Service: Vascular;  Laterality: Left;   INCISION AND DRAINAGE OF WOUND Left 11/24/2022   Procedure: DEBRIDEMENT WOUND LEFT LOWER EXTREMITY;  Surgeon: Renford Dills, MD;  Location: [facility];  Service: Vascular;  Laterality: Left;   IRRIGATION AND DEBRIDEMENT ABSCESS Left 10/03/2017   Procedure: IRRIGATION AND DEBRIDEMENT ABSCESS with debridement of skin, soft tissue, muscle 50sq cm;  Surgeon: Annice Needy, MD;  Location: [facility];  Service: General;  Laterality: Left;   LOWER EXTREMITY ANGIOGRAPHY Left 10/15/2022   Procedure: Lower Extremity Angiography;  Surgeon: Renford Dills, MD;   Location: [facility];  Service: Cardiovascular;  Laterality: Left;   TEE WITHOUT CARDIOVERSION N/A 03/29/2023   Procedure: TRANSESOPHAGEAL ECHOCARDIOGRAM;  Surgeon: Antonieta Iba, MD;  Location: [facility];  Service: Cardiovascular;  Laterality: N/A;   TEMPORARY DIALYSIS CATHETER  02/20/2019   Procedure: TEMPORARY DIALYSIS CATHETER;  Surgeon: Renford Dills, MD;  Location: [facility];  Service: Cardiovascular;;   UPPER EXTREMITY ANGIOGRAPHY Left 09/18/2019   Procedure: UPPER EXTREMITY ANGIOGRAPHY;  Surgeon: Renford Dills, MD;  Location: [facility];  Service: Cardiovascular;  Laterality: Left;   WOUND DEBRIDEMENT Left 11/10/2022   Procedure: DEBRIDEMENT WOUND;  Surgeon: Renford Dills, MD;  Location: [facility];  Service: Vascular;  Laterality: Left;   WOUND DEBRIDEMENT Left 11/12/2022   Procedure: DEBRIDEMENT WOUND;  Surgeon: Renford Dills, MD;  Location: [facility];  Service: Vascular;  Laterality: Left;   WOUND DEBRIDEMENT Left 01/06/2023   Procedure: DEBRIDEMENT WOUND;  Surgeon: Renford Dills, MD;  Location: [facility];  Service: Vascular;  Laterality: Left;    Allergies  Allergen Reactions   Lisinopril Swelling    Lips mouth    Furosemide Cough    Light headed, blurry vision, weakness.   Latex Itching   Silicone Itching   Adhesive [Tape] Itching and Rash    Prior to Admission medications   Medication Sig Start Date End Date Taking? Authorizing Provider  acetaminophen (TYLENOL) 325 MG tablet Take 2 tablets (650 mg total) by mouth every 6 (six) hours as needed for mild pain (or Fever >/= 101). 10/05/22  Yes Lurene Shadow, MD  allopurinol (ZYLOPRIM) 100 MG tablet Take 100 mg by mouth daily.   Yes [provider]  amLODipine (NORVASC) 10 MG tablet Take 5 mg by mouth daily as needed (if bp is 190/90 or higher).    Yes [provider]  apixaban (ELIQUIS) 5 MG TABS tablet Take 1 tablet (5 mg total) by mouth 2 (two) times daily.  11/25/22  Yes Jaquila Santelli R, NP  atorvastatin (LIPITOR) 80 MG tablet Take 80 mg by mouth at bedtime. 03/15/23  Yes [provider]  B Complex-C-Folic Acid (RENA-VITE RX) 1 MG TABS Take 1 tablet by mouth 3 (three) times a week.  08/30/19  Yes [provider]  calcium acetate (PHOSLO) 667 MG capsule Take 1,334-2,001 mg by mouth See admin instructions. Take 2001 mg by mouth with each meal & take 1334 mg by mouth with each snack. 01/23/19  Yes [provider]  cinacalcet (SENSIPAR) 30 MG tablet Take 30 mg by mouth daily.   Yes [provider]  CINNAMON PO Take 500 mg by mouth 2 (two) times daily.    Yes [provider]  clopidogrel (PLAVIX) 75 MG tablet TAKE 1 TABLET BY MOUTH EVERY DAY 05/23/23  Yes Brown, Fallon E, NP  Insulin Aspart FlexPen (NOVOLOG) 100 UNIT/ML Inject 5 Units subcutaneously 3 (three) times daily with meals SKIP dose if your sugar before your meal is your sugar is below 100. 08/29/23  Yes [provider]  insulin degludec (TRESIBA) 100 UNIT/ML FlexTouch Pen Inject 30 Units subcutaneously once daily 08/29/23  Yes [provider]  lidocaine-prilocaine (EMLA) cream Apply 1 application topically as needed (port access).   Yes [provider]  Multiple Vitamin (MULTIVITAMIN WITH MINERALS) TABS tablet Take 1 tablet by mouth 3 (three) times a week. Men's Multivitamin   Yes [provider]  Multiple Vitamins-Minerals (EYE HEALTH PO) Take 1 capsule by mouth daily.    Yes [provider]  Polyethyl Glycol-Propyl Glycol (SYSTANE OP) Place 1 drop into both eyes daily as needed (dry eyes).   Yes [provider]  ACCU-CHEK GUIDE test strip USE 1 STRIP TOTAL 2 TIMES DAILY FOR 90 DAYS 01/10/23   [provider]  atenolol (TENORMIN) 50 MG tablet Take 25 mg by mouth daily as needed (if bp is 190/90 or higher).  Patient not taking: Reported on 12/12/2023    [provider]  Continuous Glucose  Sensor (DEXCOM G7 SENSOR) MISC USE 1 EACH EVERY 10 (TEN) DAYS 11/17/22   [provider]  doxycycline (VIBRA-TABS) 100 MG tablet Take 100 mg by mouth 2 (two) times daily. Patient not taking: Reported on 12/12/2023    [provider]  Insulin Glargine (LANTUS SOLOSTAR) 100 UNIT/ML Solostar Pen Inject 20 Units into the skin at bedtime. Patient not taking: Reported on 12/12/2023    [provider]  Melatonin 5 MG CAPS Take 1 capsule by mouth at bedtime as needed. Patient not taking: Reported on 09/16/2023    [provider]  sildenafil (REVATIO) 20 MG tablet Take 1 tablet daily.  Do not take with nitrates. Patient not taking: Reported on 09/16/2023 01/17/23   Matilde Son A, PA-C  traZODone (DESYREL) 50 MG tablet Take 50 mg by mouth at bedtime. Patient not taking: Reported on 12/12/2023 11/04/22   [provider]    Social History   Socioeconomic History   Marital status: Divorced    Spouse name: Not on file   Number of children: Not on file   Years of education: Not on file   Highest education level: Not on file  Occupational History   Not on file  Tobacco Use   Smoking status: Former    Types: Cigars    Passive exposure: Past   Smokeless tobacco: Former    Quit date: 09/30/2017   Tobacco comments:    occasional  Vaping Use   Vaping status: Never Used  Substance and Sexual Activity   Alcohol use: Yes    Alcohol/week: 1.0 standard drink of alcohol    Types: 1 Cans of beer per week    Comment: beer   Drug use: Not Currently   Sexual activity: Yes  Other Topics Concern   Not on file  Social History Narrative   Lives alone.  Significant other, Debbie.    Social Drivers of Corporate investment banker Strain: Not on file  Food Insecurity: No Food Insecurity (12/13/2023)   Hunger Vital Sign    Worried About Running Out of Food in the Last Year:  Never true    Ran Out of Food in the Last Year: Never true  Transportation Needs: No  Transportation Needs (12/13/2023)   PRAPARE - Administrator, Civil Service (Medical): No    Lack of Transportation (Non-Medical): No  Physical Activity: Not on file  Stress: Not on file  Social Connections: Unknown (12/13/2023)   Social Connection and Isolation Panel [NHANES]    Frequency of Communication with Friends and Family: Not on file    Frequency of Social Gatherings with Friends and Family: Not on file    Attends Religious Services: Not on file    Active Member of Clubs or Organizations: Not on file    Attends Banker Meetings: Not on file    Marital Status: Living with partner  Intimate Partner Violence: Not At Risk (12/13/2023)   Humiliation, Afraid, Rape, and Kick questionnaire    Fear of Current or Ex-Partner: No    Emotionally Abused: No    Physically Abused: No    Sexually Abused: No     Family History  Problem Relation Age of Onset   Diabetes Father    Kidney disease Father    Diabetes Daughter     ROS: Otherwise negative unless mentioned in HPI  Physical Examination  Vitals:   12/13/23 0845 12/13/23 0918  BP: 120/78 (!) 188/80  Pulse: 64 (!) 59  Resp: 17 13  Temp:  97.7 F (36.5 C)  SpO2: 98% 96%   There is no height or weight on file to calculate BMI.  General:  WDWN in NAD Gait: Not observed HENT: WNL, normocephalic Pulmonary: normal non-labored breathing, without Rales, rhonchi,  wheezing Cardiac: regular, without  Murmurs, rubs or gallops; without carotid bruits Abdomen: Positive bowel sounds throughout, soft, NT/ND, no masses Skin: without rashes Vascular Exam/Pulses: Unable to palpate bilateral lower extremity pulses due to morbid obesity and +4 edema due to lymphedema.  Left upper extremity with AV fistula no bruit or thrill felt or heard. Extremities: without ischemic changes, without Gangrene , without cellulitis; without open wounds;  Musculoskeletal: no muscle wasting or atrophy  Neurologic: A&O X 3;  No focal  weakness or paresthesias are detected; speech is fluent/normal Psychiatric:  The pt has Normal affect. Lymph:  Unremarkable  CBC    Component Value Date/Time   WBC 4.8 12/13/2023 0432   RBC 3.69 (L) 12/13/2023 0432   HGB 12.3 (L) 12/13/2023 0432   HCT 37.8 (L) 12/13/2023 0432   PLT 112 (L) 12/13/2023 0432   MCV 102.4 (H) 12/13/2023 0432   MCH 33.3 12/13/2023 0432   MCHC 32.5 12/13/2023 0432   RDW 14.5 12/13/2023 0432   LYMPHSABS 1.2 12/12/2023 1518   MONOABS 0.5 12/12/2023 1518   EOSABS 0.1 12/12/2023 1518   BASOSABS 0.0 12/12/2023 1518    BMET    Component Value Date/Time   NA 143 12/13/2023 0432   K 5.2 (H) 12/13/2023 0432   CL 106 12/13/2023 0432   CO2 22 12/13/2023 0432   GLUCOSE 183 (H) 12/13/2023 0432   BUN 124 (H) 12/13/2023 0432   CREATININE 22.51 (H) 12/13/2023 0432   CALCIUM 8.7 (L) 12/13/2023 0432   GFRNONAA 2 (L) 12/13/2023 0432   GFRAA 9 (L) 02/19/2020 1316    COAGS: Lab Results  Component Value Date   INR 1.2 12/12/2023   INR 1.0 03/24/2023   INR 1.1 11/12/2022     Non-Invasive Vascular Imaging:   None Ordered    Statin:  Yes.  Beta Blocker:  Yes.   Aspirin:  No. ACEI:  No. ARB:  No. CCB use:  Yes Other antiplatelets/anticoagulants:  Yes.   Eliquis 5 Mg BID, Plavix 75 mg daily.    ASSESSMENT/PLAN: This is a 53 y.o. male presents to [facility] emergency department with AV fistula occlusion in his left upper arm.  Patient states he last went to dialysis on the previous Thursday and was told that his fistula was clotted.  He did not have dialysis last Saturday.  PLAN Vascular surgery plans on taking the patient to the vascular lab for a temporary dialysis catheter placement.  Patient's BUN on labs this morning was 145 thus putting him at high risk for bleeding from a tunneled catheter.  Therefore we will hold off on placing a tunneled catheter at this time or doing AV fistulogram until he can be dialyzed and his BUN can be lowered.  I had a long  discussion with the patient and his mom he at the bedside this morning.  I discussed in detail the procedure, benefits, risk, complications.  He verbalizes understanding but wishes to proceed with the fistulogram.  Again I explained to him that does not possible today.  I answered all of his other questions today.  This is only a temporary dialysis catheter patient is allowed to eat and drink prior.  We will proceed later today.   -I discussed the case in detail with Dr. Mikki Alexander MD and he agrees with the plan.   Annamaria Barrette [facility] 12/13/2023 1:04 PM

## 2023-12-13 NOTE — Progress Notes (Signed)
 PHARMACY - ANTICOAGULATION CONSULT NOTE  Pharmacy Consult for heparin infusion Indication: h/o PVD  Allergies  Allergen Reactions   Lisinopril Swelling    Lips mouth    Furosemide Cough    Light headed, blurry vision, weakness.   Latex Itching   Silicone Itching   Adhesive [Tape] Itching and Rash    Patient Measurements: 178.7 kg (recorded 11/30/23) HDW: 129.7 kg Weight: (!) 185.5 kg (408 lb 15.3 oz)  Vital Signs: Temp: 97.7 F (36.5 C) (04/15 2006) Temp Source: Oral (04/15 1842) BP: 157/61 (04/15 2006) Pulse Rate: 69 (04/15 2006)  Labs: Recent Labs    12/12/23 1518 12/12/23 2355 12/13/23 0432 12/13/23 1036 12/13/23 2120  HGB 13.8  --  12.3*  --   --   HCT 42.2  --  37.8*  --   --   PLT 147*  --  112*  --   --   APTT 35  --   --  66*  --   LABPROT  --  15.3*  --   --   --   INR  --  1.2  --   --   --   HEPARINUNFRC  --  0.59  --  0.27* 0.56  CREATININE 21.60*  --  22.51*  --   --     Estimated Creatinine Clearance: 6.7 mL/min (A) (by C-G formula based on SCr of 22.51 mg/dL (H)).   Medical History: Past Medical History:  Diagnosis Date   Anemia    Chronic kidney disease    14% FUNCTION   Diabetes mellitus without complication (HCC)    Diabetic retinopathy (HCC)    Dyspnea    DOE   Dysrhythmia    End stage renal disease (HCC)    History of orthopnea    error wrong patietn   Hypercholesteremia    error   Hyperkalemia    error wrong patient   Hypertension    Long-term insulin use (HCC)    Lymphedema of leg    Metabolic encephalopathy    error   Microalbuminuria    MSSA (methicillin susceptible Staphylococcus aureus)    Obesity    PVD (peripheral vascular disease) (HCC)    Respiratory failure (HCC)    Error   Sepsis (HCC)    error   Sleep apnea    no CPAP   UTI (urinary tract infection)    error   Venous ulcer (HCC)    Vitamin D deficiency     Assessment: 53 y.o. male with medical history significant of  ESRD-HD (TTS), HTN. HLD, DM,  COPD, PVD on Eliquis, gout, endocarditis, lymphedema, obesity, who presents with AV fistula occlusion in left arm. Platelets LEN at baseline. Last dose of apixaban 12/11/23 am   Goal of Therapy:  anti-Xa level 0.3-0.7 units/ml aPTT 66 - 102 seconds Monitor platelets by anticoagulation protocol: Yes   4/15@1036 : HL 0.27, aPTT 66,Subtherapeutic and correlating 4/15@2120 : HL 0.56, therapeutic x 1  Plan: Continue heparin infusion rate at 1800 units/hour. Check confirmatory heparin level with morning labs, then daily once at least two levels are consecutively therapeutic. Continue to monitor CBC daily while on heparin infusion.   Bridgett Hattabaugh A Aspasia Rude, PharmD Clinical Pharmacist 12/13/2023 9:40 PM

## 2023-12-14 DIAGNOSIS — Z992 Dependence on renal dialysis: Secondary | ICD-10-CM

## 2023-12-14 DIAGNOSIS — Z95828 Presence of other vascular implants and grafts: Secondary | ICD-10-CM

## 2023-12-14 DIAGNOSIS — Z9889 Other specified postprocedural states: Secondary | ICD-10-CM

## 2023-12-14 LAB — CBC
HCT: 37.3 % — ABNORMAL LOW (ref 39.0–52.0)
Hemoglobin: 12.4 g/dL — ABNORMAL LOW (ref 13.0–17.0)
MCH: 33.2 pg (ref 26.0–34.0)
MCHC: 33.2 g/dL (ref 30.0–36.0)
MCV: 99.7 fL (ref 80.0–100.0)
Platelets: 116 10*3/uL — ABNORMAL LOW (ref 150–400)
RBC: 3.74 MIL/uL — ABNORMAL LOW (ref 4.22–5.81)
RDW: 14.5 % (ref 11.5–15.5)
WBC: 4.4 10*3/uL (ref 4.0–10.5)
nRBC: 0 % (ref 0.0–0.2)

## 2023-12-14 LAB — BASIC METABOLIC PANEL WITH GFR
Anion gap: 14 (ref 5–15)
BUN: 102 mg/dL — ABNORMAL HIGH (ref 6–20)
CO2: 21 mmol/L — ABNORMAL LOW (ref 22–32)
Calcium: 8.5 mg/dL — ABNORMAL LOW (ref 8.9–10.3)
Chloride: 104 mmol/L (ref 98–111)
Creatinine, Ser: 20.02 mg/dL — ABNORMAL HIGH (ref 0.61–1.24)
GFR, Estimated: 2 mL/min — ABNORMAL LOW (ref >60)
Glucose, Bld: 73 mg/dL (ref 70–99)
Potassium: 4.7 mmol/L (ref 3.5–5.1)
Sodium: 139 mmol/L (ref 135–145)

## 2023-12-14 LAB — HEPARIN LEVEL (UNFRACTIONATED): Heparin Unfractionated: 0.4 [IU]/mL (ref 0.30–0.70)

## 2023-12-14 LAB — GLUCOSE, CAPILLARY
Glucose-Capillary: 142 mg/dL — ABNORMAL HIGH (ref 70–99)
Glucose-Capillary: 93 mg/dL (ref 70–99)
Glucose-Capillary: 199 mg/dL — ABNORMAL HIGH (ref 70–99)

## 2023-12-14 LAB — PHOSPHORUS: Phosphorus: 7.7 mg/dL — ABNORMAL HIGH (ref 2.5–4.6)

## 2023-12-14 MED ORDER — FENTANYL CITRATE PF 50 MCG/ML IJ SOSY: 12.5000 ug | PREFILLED_SYRINGE | Freq: Once | INTRAMUSCULAR | Status: DC | PRN

## 2023-12-14 MED ORDER — MIDAZOLAM HCL 2 MG/ML PO SYRP
8.0000 mg | ORAL_SOLUTION | Freq: Once | ORAL | Status: DC | PRN
  Filled 2023-12-14: qty 5

## 2023-12-14 MED ORDER — METHYLPREDNISOLONE SODIUM SUCC 125 MG IJ SOLR: 125.0000 mg | Freq: Once | INTRAMUSCULAR | Status: DC | PRN

## 2023-12-14 MED ORDER — SODIUM CHLORIDE 0.9 % IV SOLN: INTRAVENOUS | Status: DC

## 2023-12-14 MED ORDER — FAMOTIDINE 20 MG PO TABS: 40.0000 mg | ORAL_TABLET | Freq: Once | ORAL | Status: DC | PRN

## 2023-12-14 MED ORDER — CEFAZOLIN SODIUM-DEXTROSE 1-4 GM/50ML-% IV SOLN
1.0000 g | INTRAVENOUS | Status: AC
  Administered 2023-12-15: 1 g via INTRAVENOUS
  Filled 2023-12-14: qty 50

## 2023-12-14 MED ORDER — DIPHENHYDRAMINE HCL 50 MG/ML IJ SOLN: 50.0000 mg | Freq: Once | INTRAMUSCULAR | Status: DC | PRN

## 2023-12-14 NOTE — Progress Notes (Signed)
 PHARMACY - ANTICOAGULATION CONSULT NOTE  Pharmacy Consult for heparin infusion Indication: h/o PVD  Allergies  Allergen Reactions   Lisinopril Swelling    Lips mouth    Furosemide Cough    Light headed, blurry vision, weakness.   Latex Itching   Silicone Itching   Adhesive [Tape] Itching and Rash    Patient Measurements: 178.7 kg (recorded 11/30/23) HDW: 129.7 kg Weight: (!) 185.5 kg (408 lb 15.3 oz)  Vital Signs: Temp: 98.3 F (36.8 C) (04/16 0151) Temp Source: Oral (04/15 1842) BP: 139/59 (04/16 0151) Pulse Rate: 69 (04/16 0151)  Labs: Recent Labs    12/12/23 1518 12/12/23 1518 12/12/23 2355 12/13/23 0432 12/13/23 1036 12/13/23 2120 12/14/23 0533  HGB 13.8  --   --  12.3*  --   --  12.4*  HCT 42.2  --   --  37.8*  --   --  37.3*  PLT 147*  --   --  112*  --   --  116*  APTT 35  --   --   --  66*  --   --   LABPROT  --   --  15.3*  --   --   --   --   INR  --   --  1.2  --   --   --   --   HEPARINUNFRC  --    < > 0.59  --  0.27* 0.56 0.40  CREATININE 21.60*  --   --  22.51*  --   --   --    < > = values in this interval not displayed.    Estimated Creatinine Clearance: 6.7 mL/min (A) (by C-G formula based on SCr of 22.51 mg/dL (H)).   Medical History: Past Medical History:  Diagnosis Date   Anemia    Chronic kidney disease    14% FUNCTION   Diabetes mellitus without complication (HCC)    Diabetic retinopathy (HCC)    Dyspnea    DOE   Dysrhythmia    End stage renal disease (HCC)    History of orthopnea    error wrong patietn   Hypercholesteremia    error   Hyperkalemia    error wrong patient   Hypertension    Long-term insulin use (HCC)    Lymphedema of leg    Metabolic encephalopathy    error   Microalbuminuria    MSSA (methicillin susceptible Staphylococcus aureus)    Obesity    PVD (peripheral vascular disease) (HCC)    Respiratory failure (HCC)    Error   Sepsis (HCC)    error   Sleep apnea    no CPAP   UTI (urinary tract  infection)    error   Venous ulcer (HCC)    Vitamin D deficiency     Assessment: 53 y.o. male with medical history significant of  ESRD-HD (TTS), HTN. HLD, DM, COPD, PVD on Eliquis, gout, endocarditis, lymphedema, obesity, who presents with AV fistula occlusion in left arm. Platelets LEN at baseline. Last dose of apixaban 12/11/23 am   Goal of Therapy:  anti-Xa level 0.3-0.7 units/ml aPTT 66 - 102 seconds Monitor platelets by anticoagulation protocol: Yes   4/15@1036 : HL 0.27, aPTT 66,Subtherapeutic and correlating 4/15@2120 : HL 0.56, therapeutic x 1 4/16 0533 HL 0.40, therapeutic x 2  Plan: Continue heparin infusion rate at 1800 units/hour. Recheck heparin level daily with morning labs. Continue to monitor CBC daily while on heparin infusion.  Otelia Sergeant, PharmD, [facility] 12/14/2023  6:36 AM

## 2023-12-14 NOTE — H&P (View-Only) (Signed)
 Progress Note    12/14/2023 1:29 PM 1 Day Post-Op  Subjective: [name] is a 53 year old male who is now status postop day #1 from temporary dialysis catheter access placement.  Patient is resting comfortably sitting at the bedside today.  Denies any pain to the catheter insertion site.  Denies any difficulties ambulating.  Patient does endorse soreness to his left upper extremity AV graft site.  No other complaints overnight.  Vitals all remained stable.  Review of Systems  Constitutional: Negative.   Eyes: Negative.   Respiratory: Negative.    Cardiovascular: Negative.   Gastrointestinal: Negative.   Genitourinary: Negative.   Neurological: Negative.    Vitals:   12/14/23 1317 12/14/23 1325  BP: 137/83 (!) 161/111  Pulse: 65 (!) 57  Resp: 14 16  Temp: 97.7 F (36.5 C)   SpO2: 99% 98%   Physical Exam: Cardiac:  RRR, normal S1 and S2.  No rubs clicks or gallops or murmurs noted. Lungs: Clear on auscultation except for small amount of rales in the bases bilaterally.  No rhonchi or wheezing noted. Incisions: Right groin incision with temporary dialysis access placement.  Dressings clean dry and intact.  No hematoma seroma to note. Extremities:  Unable to palpate bilateral lower extremity pulses due to morbid obesity and +4 edema due to lymphedema. Left upper extremity with AV fistula no bruit or thrill felt or heard.  Abdomen: Morbidly obese, positive bowel sounds throughout, soft, nondistended nontender. Neurologic: And oriented, answers all questions and follows commands appropriately.  CBC    Component Value Date/Time   WBC 4.4 12/14/2023 0533   RBC 3.74 (L) 12/14/2023 0533   HGB 12.4 (L) 12/14/2023 0533   HCT 37.3 (L) 12/14/2023 0533   PLT 116 (L) 12/14/2023 0533   MCV 99.7 12/14/2023 0533   MCH 33.2 12/14/2023 0533   MCHC 33.2 12/14/2023 0533   RDW 14.5 12/14/2023 0533   LYMPHSABS 1.2 12/12/2023 1518   MONOABS 0.5 12/12/2023 1518   EOSABS 0.1 12/12/2023 1518    BASOSABS 0.0 12/12/2023 1518    BMET    Component Value Date/Time   NA 139 12/14/2023 0533   K 4.7 12/14/2023 0533   CL 104 12/14/2023 0533   CO2 21 (L) 12/14/2023 0533   GLUCOSE 73 12/14/2023 0533   BUN 102 (H) 12/14/2023 0533   CREATININE 20.02 (H) 12/14/2023 0533   CALCIUM 8.5 (L) 12/14/2023 0533   GFRNONAA 2 (L) 12/14/2023 0533   GFRAA 9 (L) 02/19/2020 1316    INR    Component Value Date/Time   INR 1.2 12/12/2023 2355     Intake/Output Summary (Last 24 hours) at 12/14/2023 1329 Last data filed at 12/13/2023 1842 Gross per 24 hour  Intake --  Output 2000 ml  Net -2000 ml     Assessment/Plan:  53 y.o. male is s/p primary dialysis access placement.  1 Day Post-Op   PLAN Patient is postop day 1 from temporary dialysis access placement without complications.  Hemodialysis access is working well.  Patient reports being dialyzed today as well as tomorrow and the day after. We plan on taking the patient to the vascular lab on Thursday, 12/15/2023 if time permits or 12/16/2023 Friday for left upper extremity fistulogram with possible PermCath placement if unable to revascularize fistula. Patient is made aware and wishes to proceed. Pain medication as needed Ambulate 3 times a day. Heart healthy renal diet. Hemodialysis per nephrology.  DVT prophylaxis: Heparin with dialysis.   [facility] 12/14/2023 1:29 PM

## 2023-12-14 NOTE — Progress Notes (Signed)
 PROGRESS NOTE    [name]   ZOX:[id number] DOB: Oct 21, 1970  DOA: 12/12/2023 Date of Service: 12/14/23 which is hospital day 1  PCP: Marina Goodell, MD    Hospital course / significant events:   HPI: This is a 53 y.o. male with medical history significant of  ESRD-HD (TTS), HTN. HLD, DM, COPD, PVD on Eliquis, gout, endocarditis, lymphedema, obesity, who presents with AV fistula occlusion in left arm. Pt states that he had dialysis on Tuesday 04/08. He went to dialysis on Thursday 04/10, and was told that his fistula was clotted and they were unable to use HD.  He did not try to do dialysis on Saturday 04/12.    04/14: admitted to hospitalist service evening  04/15: temp cath placed. HD today, 2L off, had to be cut short d/t GI issues 04/16: continue w/ HD today. Fistulagram planned for tomorrow.      Consultants:  Nephrology Vascular surgery   Procedures/Surgeries: 04/15: placement temporary HD catheter R femoral vein - Dr Wyn Quaker      ASSESSMENT & PLAN:   AV fistula occlusion:  Vascular following - planning to do fistulogram tomorrow 04/17 Eliquis to IV heparin pharmacy to dose    Hypertension:   IV hydralazine as needed. BP has been labile, ranging from 120-188 systolic.  resume amlodipine if needed following dialysis.  He has been prescribed atenolol but does not take it currently will hold for now    End stage renal disease on HD (TTS):  Nephrology assistance appreciated  Anticipate daily treatments to manage fluid volume    Type 1 diabetes mellitus with renal complication Recent A1c 8.8, poorly controlled Glargine insulin 20 units daily  Sliding scale insulin Consistent carb diet    Gout Allopurinol   HLD (hyperlipidemia) Lipitor   COPD (chronic obstructive pulmonary disease): Stable Bronchodilators as needed   PVD (peripheral vascular disease)  Continue Lipitor switch Eliquis to IV Heparin hold Plavix temporarily    Super Morbid Obesity  based on BMI 50 Underweight - under 18  overweight - 25 to 29 obese - 30 or more Class 1 obesity: BMI of 30.0 to 34 Class 2 obesity: BMI of 35.0 to 39 Class 3 obesity: BMI of 40.0 to 49 Super Morbid Obesity: BMI 50-59 Super-super Morbid Obesity: BMI 60+ Significantly low or high BMI is associated with higher medical risk.  Weight management advised as adjunct to other disease management and risk reduction treatments    DVT prophylaxis: heparin IV fluids: no continuous IV fluids  Nutrition: cardiac/carb diet  Central lines / other devices: temp cath R femoral   Code Status: full code ACP documentation reviewed: none on file in [facility]  TOC needs: TBD Medical barriers to dispo: continuing dialysis. Expected medical readiness for discharge next few days / pend fistulagram tomorrow .              Subjective / Brief ROS:  Patient reports no concerns at this time Denies CP/SOB.  Pain controlled.  Denies new weakness.  Tolerating diet.  Reports no concerns w/ urination/defecation.   Family Communication: family at bedside on rounds     Objective Findings:  Vitals:   12/14/23 1430 12/14/23 1500 12/14/23 1530 12/14/23 1600  BP: (!) 160/86 (!) 153/90 (!) 168/80 (!) 149/99  Pulse: 70 66 70 71  Resp: 19 15 18 16   Temp:      TempSrc:      SpO2: 97% 98% 98% 98%  Weight:  Intake/Output Summary (Last 24 hours) at 12/14/2023 1624 Last data filed at 12/13/2023 1842 Gross per 24 hour  Intake --  Output 2000 ml  Net -2000 ml   Filed Weights   12/13/23 1517 12/13/23 1842 12/14/23 1317  Weight: (!) 187.5 kg (!) 185.5 kg (!) 185.7 kg    Examination:  Physical Exam Constitutional:      General: He is not in acute distress.    Appearance: He is obese. He is not ill-appearing.  Cardiovascular:     Rate and Rhythm: Normal rate and regular rhythm.  Pulmonary:     Effort: Pulmonary effort is normal.     Breath sounds: Normal breath sounds.  Musculoskeletal:      Comments: LE wrapped bilaterally, wraps were not removed, no proximal erythema/edema at knees   Neurological:     General: No focal deficit present.     Mental Status: He is alert and oriented to person, place, and time. Mental status is at baseline.  Psychiatric:        Mood and Affect: Mood normal.        Behavior: Behavior normal.          Scheduled Medications:   allopurinol  100 mg Oral Daily   atorvastatin  80 mg Oral QHS   calcium acetate  2,001 mg Oral TID WC   Chlorhexidine Gluconate Cloth  6 each Topical Q0600   cinacalcet  30 mg Oral Q breakfast   insulin aspart  0-5 Units Subcutaneous QHS   insulin aspart  0-9 Units Subcutaneous TID WC   insulin glargine-yfgn  20 Units Subcutaneous Daily   multivitamin  1 tablet Oral Daily   multivitamin  1 tablet Oral Once per day on Monday Wednesday Friday   multivitamin with minerals  1 tablet Oral Once per day on Monday Wednesday Friday    Continuous Infusions:  heparin 1,800 Units/hr (12/14/23 0245)    PRN Medications:  acetaminophen, albuterol, calcium acetate **AND** calcium acetate, dextromethorphan-guaiFENesin, hydrALAZINE, ondansetron (ZOFRAN) IV  Antimicrobials from admission:  Anti-infectives (From admission, onward)    None           Data Reviewed:  I have personally reviewed the following...  CBC: Recent Labs  Lab 12/12/23 1518 12/13/23 0432 12/14/23 0533  WBC 6.3 4.8 4.4  NEUTROABS 4.4  --   --   HGB 13.8 12.3* 12.4*  HCT 42.2 37.8* 37.3*  MCV 101.0* 102.4* 99.7  PLT 147* 112* 116*   Basic Metabolic Panel: Recent Labs  Lab 12/12/23 1518 12/13/23 0432 12/14/23 0532 12/14/23 0533  NA 141 143  --  139  K 5.0 5.2*  --  4.7  CL 104 106  --  104  CO2 20* 22  --  21*  GLUCOSE 116* 183*  --  73  BUN 115* 124*  --  102*  CREATININE 21.60* 22.51*  --  20.02*  CALCIUM 9.2 8.7*  --  8.5*  PHOS  --   --  7.7*  --    GFR: Estimated Creatinine Clearance: 7.5 mL/min (A) (by C-G formula  based on SCr of 20.02 mg/dL (H)). Liver Function Tests: Recent Labs  Lab 12/12/23 1518  AST 9*  ALT 13  ALKPHOS 51  BILITOT 1.0  PROT 7.5  ALBUMIN 3.9   No results for input(s): "LIPASE", "AMYLASE" in the last 168 hours. No results for input(s): "AMMONIA" in the last 168 hours. Coagulation Profile: Recent Labs  Lab 12/12/23 2355  INR 1.2   Cardiac  Enzymes: No results for input(s): "CKTOTAL", "CKMB", "CKMBINDEX", "TROPONINI" in the last 168 hours. BNP (last 3 results) No results for input(s): "PROBNP" in the last 8760 hours. HbA1C: No results for input(s): "HGBA1C" in the last 72 hours. CBG: Recent Labs  Lab 12/13/23 0717 12/13/23 1200 12/13/23 2007 12/13/23 2051 12/14/23 1159  GLUCAP 139* 81 57* 81 142*   Lipid Profile: No results for input(s): "CHOL", "HDL", "LDLCALC", "TRIG", "CHOLHDL", "LDLDIRECT" in the last 72 hours. Thyroid Function Tests: No results for input(s): "TSH", "T4TOTAL", "FREET4", "T3FREE", "THYROIDAB" in the last 72 hours. Anemia Panel: No results for input(s): "VITAMINB12", "FOLATE", "FERRITIN", "TIBC", "IRON", "RETICCTPCT" in the last 72 hours. Most Recent Urinalysis On File:     Component Value Date/Time   COLORURINE YELLOW (A) 10/03/2017 1120   APPEARANCEUR CLOUDY (A) 10/03/2017 1120   LABSPEC 1.015 10/03/2017 1120   PHURINE 5.0 10/03/2017 1120   GLUCOSEU NEGATIVE 10/03/2017 1120   HGBUR SMALL (A) 10/03/2017 1120   BILIRUBINUR NEGATIVE 10/03/2017 1120   KETONESUR NEGATIVE 10/03/2017 1120   PROTEINUR 30 (A) 10/03/2017 1120   NITRITE NEGATIVE 10/03/2017 1120   LEUKOCYTESUR NEGATIVE 10/03/2017 1120   Sepsis Labs: @LABRCNTIP (procalcitonin:4,lacticidven:4) Microbiology: No results found for this or any previous visit (from the past 240 hours).    Radiology Studies last 3 days: PERIPHERAL VASCULAR CATHETERIZATION Result Date: 12/13/2023 See surgical note for result.       Montarius Kitagawa, DO [facility] 12/14/2023, 4:24  PM    Dictation software may have been used to generate the above note. Typos may occur and escape review in typed/dictated notes. Please contact Dr Authur Leghorn directly for clarity if needed.  Staff may message me via secure chat in Epic  but this may not receive an immediate response,  please page me for urgent matters!  If 7PM-7AM, please contact night coverage [email or web address]

## 2023-12-14 NOTE — Plan of Care (Signed)
   Problem: Coping: Goal: Ability to adjust to condition or change in health will improve Outcome: Progressing

## 2023-12-14 NOTE — Hospital Course (Addendum)
 Hospital course / significant events:   HPI: This is a 53 y.o. male with medical history significant of  ESRD-HD (TTS), HTN. HLD, DM, COPD, PVD on Eliquis , gout, endocarditis, lymphedema, obesity, who presents with AV fistula occlusion in left arm. Pt states that he had dialysis on Tuesday 04/08. He went to dialysis on Thursday 04/10, and was told that his fistula was clotted and they were unable to use HD.  He did not try to do dialysis on Saturday 04/12.    04/14: admitted to hospitalist service evening  04/15: temp cath placed. HD today, 2 L off, had to be cut short d/t GI issues 04/16: continue w/ HD today, 3.5 L off. Fistulagram planned for tomorrow.  04/17: AV Fistulagram today - left upper arm AV fistula was unable to be revascularized, will be scheduled outpatient as follow-up for reconstruction of his left arm AV graft in the OR. Vascular did place R internal jugular permcath in the meantime  04/18: permcath functional for dialysis, pt ok for dc home, confirmed needs to be on eliquis  + plavix , compliance stressed      Consultants:  Nephrology Vascular surgery   Procedures/Surgeries: 04/15: placement temporary HD catheter R femoral vein - Dr Vonna Guardian 04/17: vascular procedures:  Fistulagram and attempted thrombectomy L upper arm AV graft  R internal jugular tunneled permcath placement (given thrombosed unsalvageable L arm AVG)      ASSESSMENT & PLAN:   AV fistula occlusion: not salvageable R internal jugular permcath placed for dialysis access  Vascular following outpatient, will need to go to OR for reconstruction of AV graft  Resume eliquis  on discharge    Hypertension:   resume home meds   End stage renal disease on HD (TTS):  Nephrology assistance appreciated  Follow w/ dialysis  Type 1 diabetes mellitus with renal complication and with hyperglycemia Recent A1c 8.8, poorly controlled Resume home meds Follow w/ endocrinology outpatient    Gout Allopurinol    HLD  (hyperlipidemia) Lipitor   COPD (chronic obstructive pulmonary disease): Stable Bronchodilators as needed   PVD (peripheral vascular disease)  Continue Lipitor Resume Eliquis  + Plavix  on discharge     Super Morbid Obesity based on BMI 50 Underweight - under 18  overweight - 25 to 29 obese - 30 or more Class 1 obesity: BMI of 30.0 to 34 Class 2 obesity: BMI of 35.0 to 39 Class 3 obesity: BMI of 40.0 to 49 Super Morbid Obesity: BMI 50-59 Super-super Morbid Obesity: BMI 60+ Significantly low or high BMI is associated with higher medical risk.  Weight management advised as adjunct to other disease management and risk reduction treatments    DVT prophylaxis: heparin  IV fluids: no continuous IV fluids  Nutrition: cardiac/carb diet  Central lines / other devices: temp cath R femoral   Code Status: full code ACP documentation reviewed: none on file in [facility]  TOC needs: TBD Medical barriers to dispo: continuing dialysis. Expected medical readiness for discharge tomorrow or day after / pend fistulagram .

## 2023-12-14 NOTE — Progress Notes (Signed)
 Hemodialysis Note:  Received patient in bed to unit. Alert and oriented. Informed consent singed and in chart.  Treatment initiated: 1325 Treatment completed: 1700  Access used: Right Femoral catheter Access issues: None  Patient tolerated well. Transported back to room, alert without acute distress. Report given to patient's RN.  Total UF removed: 3.5 Liters Medications given: None  Post HD weight: 182.2 Kg  [facility]

## 2023-12-14 NOTE — Progress Notes (Signed)
 [facility]  ROUNDING NOTE   Subjective:   [name] is known to our practice from outpatient dialysis.  He dialyzes at [facility].   Patient presents to the emergency department for evaluation of a clotted dialysis access.    Patient seen sitting up in bed, family at bedside Alert and oriented Remains on room air Continues to state he has a lot of fluid on board  Scheduled for dialysis later today  Objective:  Vital signs in last 24 hours:  Temp:  [97.7 F (36.5 C)-98.3 F (36.8 C)] 98.3 F (36.8 C) (04/16 0151) Pulse Rate:  [59-81] 69 (04/16 0151) Resp:  [16-22] 19 (04/16 0151) BP: (139-184)/(56-104) 139/59 (04/16 0151) SpO2:  [96 %-100 %] 96 % (04/16 0151) Weight:  [185.5 kg-187.5 kg] 185.5 kg (04/15 1842)  Weight change:  Filed Weights   12/13/23 1517 12/13/23 1842  Weight: (!) 187.5 kg (!) 185.5 kg    Intake/Output: I/O last 3 completed shifts: In: -  Out: 2000 [Other:2000]   Intake/Output this shift:  No intake/output data recorded.  Physical Exam: General: NAD  Head: Normocephalic, atraumatic. Moist oral mucosal membranes  Eyes: Anicteric  Lungs:  Clear to auscultation  Heart: Regular rate and rhythm  Abdomen:  Soft, nontender,   Extremities: +++ Peripheral edema.  Neurologic: Alert, moving all four extremities  Skin: No lesions  Access: Left aVF (-bruit or thrill) right femoral HD temp cath placed on 4/15    Basic Metabolic Panel: Recent Labs  Lab 12/12/23 1518 12/13/23 0432 12/14/23 0533  NA 141 143 139  K 5.0 5.2* 4.7  CL 104 106 104  CO2 20* 22 21*  GLUCOSE 116* 183* 73  BUN 115* 124* 102*  CREATININE 21.60* 22.51* 20.02*  CALCIUM 9.2 8.7* 8.5*    Liver Function Tests: Recent Labs  Lab 12/12/23 1518  AST 9*  ALT 13  ALKPHOS 51  BILITOT 1.0  PROT 7.5  ALBUMIN 3.9   No results for input(s): "LIPASE", "AMYLASE" in the last 168 hours. No results for input(s): "AMMONIA" in the last 168 hours.  CBC: Recent  Labs  Lab 12/12/23 1518 12/13/23 0432 12/14/23 0533  WBC 6.3 4.8 4.4  NEUTROABS 4.4  --   --   HGB 13.8 12.3* 12.4*  HCT 42.2 37.8* 37.3*  MCV 101.0* 102.4* 99.7  PLT 147* 112* 116*    Cardiac Enzymes: No results for input(s): "CKTOTAL", "CKMB", "CKMBINDEX", "TROPONINI" in the last 168 hours.  BNP: Invalid input(s): "POCBNP"  CBG: Recent Labs  Lab 12/12/23 2144 12/13/23 0717 12/13/23 1200 12/13/23 2007 12/13/23 2051  GLUCAP 99 139* 81 57* 81    Microbiology: Results for orders placed or performed during the hospital encounter of 03/24/23  Culture, blood (Routine x 2)     Status: Abnormal   Collection Time: 03/24/23  9:41 AM   Specimen: BLOOD  Result Value Ref Range Status   Specimen Description   Final    BLOOD BLOOD RIGHT ARM Performed at [facility], [address] [place], [place] [address]    Special Requests   Final    BOTTLES DRAWN AEROBIC AND ANAEROBIC Blood Culture results may not be optimal due to an excessive volume of blood received in culture bottles Performed at [facility], [address], [place], [place] [address]    Culture  Setup Time   Final    Organism ID to follow GRAM POSITIVE COCCI IN BOTH AEROBIC AND ANAEROBIC BOTTLES CRITICAL RESULT CALLED TO,  READ BACK BY AND VERIFIED WITH: NATHAN BELEU AT 2141 ON 03/24/23 BY SS Performed at [facility], [address] [place], [place] [address]    Culture STREPTOCOCCUS GROUP G (A)  Final   Report Status 03/27/2023 FINAL  Final   Organism ID, Bacteria STREPTOCOCCUS GROUP G  Final      Susceptibility   Streptococcus group g - MIC*    CLINDAMYCIN RESISTANT Resistant     AMPICILLIN <=0.25 SENSITIVE Sensitive     ERYTHROMYCIN >=8 RESISTANT Resistant     VANCOMYCIN 0.5 SENSITIVE Sensitive     CEFTRIAXONE <=0.12 SENSITIVE Sensitive     LEVOFLOXACIN 0.5 SENSITIVE Sensitive     PENICILLIN <=0.06 SENSITIVE Sensitive     * STREPTOCOCCUS GROUP G  Blood Culture ID Panel  (Reflexed)     Status: Abnormal   Collection Time: 03/24/23  9:41 AM  Result Value Ref Range Status   Enterococcus faecalis NOT DETECTED NOT DETECTED Final   Enterococcus Faecium NOT DETECTED NOT DETECTED Final   Listeria monocytogenes NOT DETECTED NOT DETECTED Final   Staphylococcus species NOT DETECTED NOT DETECTED Final   Staphylococcus aureus (BCID) NOT DETECTED NOT DETECTED Final   Staphylococcus epidermidis NOT DETECTED NOT DETECTED Final   Staphylococcus lugdunensis NOT DETECTED NOT DETECTED Final   Streptococcus species DETECTED (A) NOT DETECTED Final    Comment: Not Enterococcus species, Streptococcus agalactiae, Streptococcus pyogenes, or Streptococcus pneumoniae. CRITICAL RESULT CALLED TO, READ BACK BY AND VERIFIED WITH: NATHAN BELUE AT 2141 ON 03/24/23 BY SS    Streptococcus agalactiae NOT DETECTED NOT DETECTED Final   Streptococcus pneumoniae NOT DETECTED NOT DETECTED Final   Streptococcus pyogenes NOT DETECTED NOT DETECTED Final   A.calcoaceticus-baumannii NOT DETECTED NOT DETECTED Final   Bacteroides fragilis NOT DETECTED NOT DETECTED Final   Enterobacterales NOT DETECTED NOT DETECTED Final   Enterobacter cloacae complex NOT DETECTED NOT DETECTED Final   Escherichia coli NOT DETECTED NOT DETECTED Final   Klebsiella aerogenes NOT DETECTED NOT DETECTED Final   Klebsiella oxytoca NOT DETECTED NOT DETECTED Final   Klebsiella pneumoniae NOT DETECTED NOT DETECTED Final   Proteus species NOT DETECTED NOT DETECTED Final   Salmonella species NOT DETECTED NOT DETECTED Final   Serratia marcescens NOT DETECTED NOT DETECTED Final   Haemophilus influenzae NOT DETECTED NOT DETECTED Final   Neisseria meningitidis NOT DETECTED NOT DETECTED Final   Pseudomonas aeruginosa NOT DETECTED NOT DETECTED Final   Stenotrophomonas maltophilia NOT DETECTED NOT DETECTED Final   Candida albicans NOT DETECTED NOT DETECTED Final   Candida auris NOT DETECTED NOT DETECTED Final   Candida glabrata NOT  DETECTED NOT DETECTED Final   Candida krusei NOT DETECTED NOT DETECTED Final   Candida parapsilosis NOT DETECTED NOT DETECTED Final   Candida tropicalis NOT DETECTED NOT DETECTED Final   Cryptococcus neoformans/gattii NOT DETECTED NOT DETECTED Final    Comment: Performed at [facility], [address] [place], [place] [address]  Culture, blood (Routine x 2)     Status: Abnormal   Collection Time: 03/24/23  9:55 AM   Specimen: BLOOD RIGHT ARM  Result Value Ref Range Status   Specimen Description   Final    BLOOD RIGHT ARM Performed at [facility], [address], [place], [place] [address]    Special Requests   Final    BOTTLES DRAWN AEROBIC AND ANAEROBIC Blood Culture adequate volume Performed at [facility], [address], [place], [place] [address]    Culture  Setup Time  Final    GRAM POSITIVE COCCI IN BOTH AEROBIC AND ANAEROBIC BOTTLES CRITICAL VALUE NOTED.  VALUE IS CONSISTENT WITH PREVIOUSLY REPORTED AND CALLED VALUE. Performed at [facility], [address] [place], [place] [address]    Culture (A)  Final    STREPTOCOCCUS GROUP G SUSCEPTIBILITIES PERFORMED ON PREVIOUS CULTURE WITHIN THE LAST 5 DAYS. Performed at [facility], [address], [place], [place] [address]    Report Status 03/27/2023 FINAL  Final  Resp panel by RT-PCR (RSV, Flu A&B, Covid) Anterior Nasal Swab     Status: None   Collection Time: 03/24/23  9:55 AM   Specimen: Anterior Nasal Swab  Result Value Ref Range Status   SARS Coronavirus 2 by RT PCR NEGATIVE NEGATIVE Final    Comment: (NOTE) SARS-CoV-2 target nucleic acids are NOT DETECTED.  The SARS-CoV-2 RNA is generally detectable in upper respiratory specimens during the acute phase of infection. The lowest concentration of SARS-CoV-2 viral copies this assay can detect is 138 copies/mL. A negative result does not preclude SARS-Cov-2 infection and should not be used as the sole basis for  treatment or other patient management decisions. A negative result may occur with  improper specimen collection/handling, submission of specimen other than nasopharyngeal swab, presence of viral mutation(s) within the areas targeted by this assay, and inadequate number of viral copies(<138 copies/mL). A negative result must be combined with clinical observations, patient history, and epidemiological information. The expected result is Negative.  Fact Sheet for Patients:  BloggerCourse.com  Fact Sheet for Healthcare Providers:  SeriousBroker.it  This test is no t yet approved or cleared by the [place] FDA and  has been authorized for detection and/or diagnosis of SARS-CoV-2 by FDA under an Emergency Use Authorization (EUA). This EUA will remain  in effect (meaning this test can be used) for the duration of the COVID-19 declaration under Section 564(b)(1) of the Act, 21 U.S.C.section 360bbb-3(b)(1), unless the authorization is terminated  or revoked sooner.       Influenza A by PCR NEGATIVE NEGATIVE Final   Influenza B by PCR NEGATIVE NEGATIVE Final    Comment: (NOTE) The Xpert Xpress SARS-CoV-2/FLU/RSV plus assay is intended as an aid in the diagnosis of influenza from Nasopharyngeal swab specimens and should not be used as a sole basis for treatment. Nasal washings and aspirates are unacceptable for Xpert Xpress SARS-CoV-2/FLU/RSV testing.  Fact Sheet for Patients: BloggerCourse.com  Fact Sheet for Healthcare Providers: SeriousBroker.it  This test is not yet approved or cleared by the [place] FDA and has been authorized for detection and/or diagnosis of SARS-CoV-2 by FDA under an Emergency Use Authorization (EUA). This EUA will remain in effect (meaning this test can be used) for the duration of the COVID-19 declaration under Section 564(b)(1) of the Act, 21  U.S.C. section 360bbb-3(b)(1), unless the authorization is terminated or revoked.     Resp Syncytial Virus by PCR NEGATIVE NEGATIVE Final    Comment: (NOTE) Fact Sheet for Patients: BloggerCourse.com  Fact Sheet for Healthcare Providers: SeriousBroker.it  This test is not yet approved or cleared by the [place] FDA and has been authorized for detection and/or diagnosis of SARS-CoV-2 by FDA under an Emergency Use Authorization (EUA). This EUA will remain in effect (meaning this test can be used) for the duration of the COVID-19 declaration under Section 564(b)(1) of the Act, 21 U.S.C. section 360bbb-3(b)(1), unless the authorization is terminated or revoked.  Performed at [facility], [address] [place],  [place] [address]   Aerobic Culture w Gram Stain (superficial specimen)     Status: None   Collection Time: 03/25/23  6:50 PM   Specimen: Wound  Result Value Ref Range Status   Specimen Description   Final    WOUND Performed at [facility], [address], [place], [place] [address]    Special Requests   Final    NONE Performed at [facility], [address] [place], [place] [address]    Gram Stain   Final    FEW WBC PRESENT, PREDOMINANTLY PMN RARE GRAM POSITIVE RODS RARE GRAM POSITIVE COCCI    Culture   Final    FEW ESCHERICHIA COLI MODERATE CORYNEBACTERIUM SPECIES Standardized susceptibility testing for this organism is not available. Performed at [facility], [address], [place], [place] [address]    Report Status 03/29/2023 FINAL  Final   Organism ID, Bacteria ESCHERICHIA COLI  Final      Susceptibility   Escherichia coli - MIC*    AMPICILLIN 4 SENSITIVE Sensitive     CEFEPIME <=0.12 SENSITIVE Sensitive     CEFTAZIDIME <=1 SENSITIVE Sensitive     CEFTRIAXONE <=0.25 SENSITIVE Sensitive     CIPROFLOXACIN 0.5 INTERMEDIATE Intermediate     GENTAMICIN <=1  SENSITIVE Sensitive     IMIPENEM <=0.25 SENSITIVE Sensitive     TRIMETH/SULFA >=320 RESISTANT Resistant     AMPICILLIN/SULBACTAM <=2 SENSITIVE Sensitive     PIP/TAZO <=4 SENSITIVE Sensitive     * FEW ESCHERICHIA COLI  Culture, blood (Routine X 2) w Reflex to ID Panel     Status: None   Collection Time: 03/26/23  5:06 AM   Specimen: BLOOD  Result Value Ref Range Status   Specimen Description BLOOD BLOOD RIGHT ARM  Final   Special Requests   Final    BOTTLES DRAWN AEROBIC AND ANAEROBIC Blood Culture adequate volume   Culture   Final    NO GROWTH 5 DAYS Performed at [facility], [address] [place], [place] [address]    Report Status 03/31/2023 FINAL  Final  Culture, blood (Routine X 2) w Reflex to ID Panel     Status: None   Collection Time: 03/26/23  5:06 AM   Specimen: BLOOD  Result Value Ref Range Status   Specimen Description BLOOD BLOOD RIGHT HAND  Final   Special Requests   Final    BOTTLES DRAWN AEROBIC ONLY Blood Culture adequate volume   Culture   Final    NO GROWTH 5 DAYS Performed at [facility], [address] [place], [place] [address]    Report Status 03/31/2023 FINAL  Final    Coagulation Studies: Recent Labs    12/12/23 2355  LABPROT 15.3*  INR 1.2    Urinalysis: No results for input(s): "COLORURINE", "LABSPEC", "PHURINE", "GLUCOSEU", "HGBUR", "BILIRUBINUR", "KETONESUR", "PROTEINUR", "UROBILINOGEN", "NITRITE", "LEUKOCYTESUR" in the last 72 hours.  Invalid input(s): "APPERANCEUR"    Imaging: PERIPHERAL VASCULAR CATHETERIZATION Result Date: 12/13/2023 See surgical note for result.    Medications:    heparin 1,800 Units/hr (12/14/23 0245)    allopurinol  100 mg Oral Daily   atorvastatin  80 mg Oral QHS   calcium acetate  2,001 mg Oral TID WC   Chlorhexidine Gluconate Cloth  6 each Topical Q0600   cinacalcet  30 mg Oral Q breakfast   insulin aspart  0-5 Units Subcutaneous QHS   insulin aspart  0-9 Units Subcutaneous  TID WC   insulin glargine-yfgn  20 Units Subcutaneous Daily   multivitamin  1 tablet Oral Daily   multivitamin  1 tablet Oral Once per day on Monday Wednesday Friday   multivitamin with minerals  1 tablet Oral Once per day on Monday Wednesday Friday   acetaminophen, albuterol, calcium acetate **AND** calcium acetate, dextromethorphan-guaiFENesin, hydrALAZINE, ondansetron (ZOFRAN) IV  Assessment/ Plan:  [name] is a 53 y.o.  male  with  ESRD-HD (TTS), HTN. HJLD, DM, COPD, PVD on Eliquis, gout, obesity, chronic ulcer in left leg and foot.   [facility]/TTS/left aVF  Malfunctioning dialysis access, left aVF without bruit or thrill.  Appreciate vascular surgery placing HD temp catheter 4/15.  Vascular to plan for fistulogram next week.  2.  End-stage renal disease on hemodialysis.  Last treatment completed on Tuesday, 4/8.  Temp cath placed yesterday, patient received dialysis with 2 L fluid removal.  Patient did terminate treatment with 41 minutes remaining due to GI issues.  Patient will receive additional treatment today, UF goal 3 to 3.5 L as tolerated.  Patient agreeable to daily treatments to optimize fluid removal.  3. Anemia of chronic kidney disease Lab Results  Component Value Date   HGB 12.4 (L) 12/14/2023    Hemoglobin stable at this time.  Will continue to monitor.  4. Secondary Hyperparathyroidism: with outpatient labs: Unavailable  Lab Results  Component Value Date   CALCIUM 8.5 (L) 12/14/2023   CAION 1.14 (L) 11/12/2022   PHOS 6.6 (H) 11/25/2022    Continue Cinacalcet and calcium acetate as ordered.  Calcium acceptable.  Will obtain updated phosphorus in a.m.   LOS: 1 Dava Rensch 4/16/202511:54 AM

## 2023-12-14 NOTE — Progress Notes (Signed)
 Progress Note    12/14/2023 1:29 PM 1 Day Post-Op  Subjective: [name] is a 53 year old male who is now status postop day #1 from temporary dialysis catheter access placement.  Patient is resting comfortably sitting at the bedside today.  Denies any pain to the catheter insertion site.  Denies any difficulties ambulating.  Patient does endorse soreness to his left upper extremity AV graft site.  No other complaints overnight.  Vitals all remained stable.  Review of Systems  Constitutional: Negative.   Eyes: Negative.   Respiratory: Negative.    Cardiovascular: Negative.   Gastrointestinal: Negative.   Genitourinary: Negative.   Neurological: Negative.    Vitals:   12/14/23 1317 12/14/23 1325  BP: 137/83 (!) 161/111  Pulse: 65 (!) 57  Resp: 14 16  Temp: 97.7 F (36.5 C)   SpO2: 99% 98%   Physical Exam: Cardiac:  RRR, normal S1 and S2.  No rubs clicks or gallops or murmurs noted. Lungs: Clear on auscultation except for small amount of rales in the bases bilaterally.  No rhonchi or wheezing noted. Incisions: Right groin incision with temporary dialysis access placement.  Dressings clean dry and intact.  No hematoma seroma to note. Extremities:  Unable to palpate bilateral lower extremity pulses due to morbid obesity and +4 edema due to lymphedema. Left upper extremity with AV fistula no bruit or thrill felt or heard.  Abdomen: Morbidly obese, positive bowel sounds throughout, soft, nondistended nontender. Neurologic: And oriented, answers all questions and follows commands appropriately.  CBC    Component Value Date/Time   WBC 4.4 12/14/2023 0533   RBC 3.74 (L) 12/14/2023 0533   HGB 12.4 (L) 12/14/2023 0533   HCT 37.3 (L) 12/14/2023 0533   PLT 116 (L) 12/14/2023 0533   MCV 99.7 12/14/2023 0533   MCH 33.2 12/14/2023 0533   MCHC 33.2 12/14/2023 0533   RDW 14.5 12/14/2023 0533   LYMPHSABS 1.2 12/12/2023 1518   MONOABS 0.5 12/12/2023 1518   EOSABS 0.1 12/12/2023 1518    BASOSABS 0.0 12/12/2023 1518    BMET    Component Value Date/Time   NA 139 12/14/2023 0533   K 4.7 12/14/2023 0533   CL 104 12/14/2023 0533   CO2 21 (L) 12/14/2023 0533   GLUCOSE 73 12/14/2023 0533   BUN 102 (H) 12/14/2023 0533   CREATININE 20.02 (H) 12/14/2023 0533   CALCIUM 8.5 (L) 12/14/2023 0533   GFRNONAA 2 (L) 12/14/2023 0533   GFRAA 9 (L) 02/19/2020 1316    INR    Component Value Date/Time   INR 1.2 12/12/2023 2355     Intake/Output Summary (Last 24 hours) at 12/14/2023 1329 Last data filed at 12/13/2023 1842 Gross per 24 hour  Intake --  Output 2000 ml  Net -2000 ml     Assessment/Plan:  53 y.o. male is s/p primary dialysis access placement.  1 Day Post-Op   PLAN Patient is postop day 1 from temporary dialysis access placement without complications.  Hemodialysis access is working well.  Patient reports being dialyzed today as well as tomorrow and the day after. We plan on taking the patient to the vascular lab on Thursday, 12/15/2023 if time permits or 12/16/2023 Friday for left upper extremity fistulogram with possible PermCath placement if unable to revascularize fistula. Patient is made aware and wishes to proceed. Pain medication as needed Ambulate 3 times a day. Heart healthy renal diet. Hemodialysis per nephrology.  DVT prophylaxis: Heparin with dialysis.   Marcie [facility] 12/14/2023 1:29 PM

## 2023-12-15 ENCOUNTER — Encounter: Payer: Self-pay | Admitting: Vascular Surgery

## 2023-12-15 ENCOUNTER — Encounter
Admission: EM | Disposition: A | Discharge status: Home / Self Care | Payer: Self-pay | Source: Home / Self Care | Attending: Osteopathic Medicine

## 2023-12-15 DIAGNOSIS — Z992 Dependence on renal dialysis: Secondary | ICD-10-CM | POA: Diagnosis not present

## 2023-12-15 DIAGNOSIS — T82868A Thrombosis of vascular prosthetic devices, implants and grafts, initial encounter: Principal | ICD-10-CM

## 2023-12-15 DIAGNOSIS — N186 End stage renal disease: Secondary | ICD-10-CM | POA: Diagnosis not present

## 2023-12-15 HISTORY — PX: DIALYSIS/PERMA CATHETER INSERTION: CATH118288

## 2023-12-15 HISTORY — PX: A/V FISTULAGRAM: CATH118298

## 2023-12-15 LAB — BASIC METABOLIC PANEL WITH GFR
Anion gap: 14 (ref 5–15)
BUN: 77 mg/dL — ABNORMAL HIGH (ref 6–20)
CO2: 22 mmol/L (ref 22–32)
Calcium: 8.6 mg/dL — ABNORMAL LOW (ref 8.9–10.3)
Chloride: 99 mmol/L (ref 98–111)
Creatinine, Ser: 15.98 mg/dL — ABNORMAL HIGH (ref 0.61–1.24)
GFR, Estimated: 3 mL/min — ABNORMAL LOW (ref >60)
Glucose, Bld: 225 mg/dL — ABNORMAL HIGH (ref 70–99)
Potassium: 4.4 mmol/L (ref 3.5–5.1)
Sodium: 135 mmol/L (ref 135–145)

## 2023-12-15 LAB — CBC
HCT: 38 % — ABNORMAL LOW (ref 39.0–52.0)
Hemoglobin: 12.4 g/dL — ABNORMAL LOW (ref 13.0–17.0)
MCH: 32.9 pg (ref 26.0–34.0)
MCHC: 32.6 g/dL (ref 30.0–36.0)
MCV: 100.8 fL — ABNORMAL HIGH (ref 80.0–100.0)
Platelets: 119 10*3/uL — ABNORMAL LOW (ref 150–400)
RBC: 3.77 MIL/uL — ABNORMAL LOW (ref 4.22–5.81)
RDW: 14.3 % (ref 11.5–15.5)
WBC: 3.9 10*3/uL — ABNORMAL LOW (ref 4.0–10.5)
nRBC: 0 % (ref 0.0–0.2)

## 2023-12-15 LAB — HEPARIN LEVEL (UNFRACTIONATED): Heparin Unfractionated: 0.32 [IU]/mL (ref 0.30–0.70)

## 2023-12-15 LAB — GLUCOSE, CAPILLARY
Glucose-Capillary: 138 mg/dL — ABNORMAL HIGH (ref 70–99)
Glucose-Capillary: 76 mg/dL (ref 70–99)
Glucose-Capillary: 88 mg/dL (ref 70–99)
Glucose-Capillary: 74 mg/dL (ref 70–99)
Glucose-Capillary: 106 mg/dL — ABNORMAL HIGH (ref 70–99)
Glucose-Capillary: 108 mg/dL — ABNORMAL HIGH (ref 70–99)

## 2023-12-15 SURGERY — A/V FISTULAGRAM
Anesthesia: Moderate Sedation | Laterality: Right

## 2023-12-15 MED ORDER — IODIXANOL 320 MG/ML IV SOLN
INTRAVENOUS | Status: DC | PRN
  Administered 2023-12-15: 35 mL

## 2023-12-15 MED ORDER — LIDOCAINE-EPINEPHRINE (PF) 1 %-1:200000 IJ SOLN
INTRAMUSCULAR | Status: DC | PRN
  Administered 2023-12-15: 20 mL

## 2023-12-15 MED ORDER — OXYCODONE HCL 5 MG PO TABS
5.0000 mg | ORAL_TABLET | Freq: Once | ORAL | Status: AC
  Administered 2023-12-15: 5 mg via ORAL

## 2023-12-15 MED ORDER — HEPARIN SODIUM (PORCINE) 1000 UNIT/ML DIALYSIS: 1000.0000 [IU] | INTRAMUSCULAR | Status: DC | PRN

## 2023-12-15 MED ORDER — HEPARIN (PORCINE) IN NACL 2000-0.9 UNIT/L-% IV SOLN
INTRAVENOUS | Status: DC | PRN
  Administered 2023-12-15: 1000 mL

## 2023-12-15 MED ORDER — HYDROCODONE-ACETAMINOPHEN 5-325 MG PO TABS
2.0000 | ORAL_TABLET | Freq: Once | ORAL | Status: AC
  Administered 2023-12-15: 2 via ORAL
  Filled 2023-12-15: qty 2

## 2023-12-15 MED ORDER — FENTANYL CITRATE (PF) 100 MCG/2ML IJ SOLN
INTRAMUSCULAR | Status: DC | PRN
  Administered 2023-12-15: 50 ug via INTRAVENOUS

## 2023-12-15 MED ORDER — HEPARIN SODIUM (PORCINE) 1000 UNIT/ML IJ SOLN
INTRAMUSCULAR | Status: DC | PRN
Start: 2023-12-15 — End: 2023-12-15
  Administered 2023-12-15: 5000 [IU] via INTRAVENOUS

## 2023-12-15 MED ORDER — MIDAZOLAM HCL 5 MG/5ML IJ SOLN
INTRAMUSCULAR | Status: AC
  Filled 2023-12-15: qty 5

## 2023-12-15 MED ORDER — HEPARIN SODIUM (PORCINE) 10000 UNIT/ML IJ SOLN
INTRAMUSCULAR | Status: DC | PRN
Start: 2023-12-15 — End: 2023-12-16
  Administered 2023-12-15: 10000 [IU]

## 2023-12-15 MED ORDER — OXYCODONE HCL 5 MG PO TABS
ORAL_TABLET | ORAL | Status: AC
  Filled 2023-12-15: qty 1

## 2023-12-15 MED ORDER — MIDAZOLAM HCL 2 MG/2ML IJ SOLN
INTRAMUSCULAR | Status: DC | PRN
  Administered 2023-12-15: 2 mg via INTRAVENOUS

## 2023-12-15 MED ORDER — DEXTROSE 50 % IV SOLN
INTRAVENOUS | Status: AC
  Filled 2023-12-15: qty 50

## 2023-12-15 MED ORDER — ALTEPLASE 2 MG IJ SOLR: 2.0000 mg | Freq: Once | INTRAMUSCULAR | Status: DC | PRN

## 2023-12-15 MED ORDER — HEPARIN SODIUM (PORCINE) 1000 UNIT/ML IJ SOLN
INTRAMUSCULAR | Status: AC
Start: 2023-12-15 — End: ?
  Filled 2023-12-15: qty 10

## 2023-12-15 MED ORDER — LIDOCAINE-EPINEPHRINE (PF) 1 %-1:200000 IJ SOLN
INTRAMUSCULAR | Status: DC | PRN
  Administered 2023-12-15: 10 mL

## 2023-12-15 MED ORDER — CEFAZOLIN SODIUM-DEXTROSE 1-4 GM/50ML-% IV SOLN
INTRAVENOUS | Status: AC
  Filled 2023-12-15: qty 50

## 2023-12-15 MED ORDER — FENTANYL CITRATE (PF) 100 MCG/2ML IJ SOLN
INTRAMUSCULAR | Status: AC
  Filled 2023-12-15: qty 2

## 2023-12-15 MED ORDER — DEXTROSE 50 % IV SOLN
12.5000 g | Freq: Once | INTRAVENOUS | Status: AC
  Administered 2023-12-15: 12.5 g via INTRAVENOUS

## 2023-12-15 SURGICAL SUPPLY — 21 items
BALLN DORADO 7X150X80 (BALLOONS) ×2 IMPLANT
BALLOON DORADO 7X150X80 (BALLOONS) IMPLANT
BIOPATCH RED 1 DISK 7.0 (GAUZE/BANDAGES/DRESSINGS) IMPLANT
CANISTER PENUMBRA ENGINE (MISCELLANEOUS) IMPLANT
CATH BEACON 5 .035 40 KMP TP (CATHETERS) IMPLANT
CATH CANNON HEMO 15FR 23CM (HEMODIALYSIS SUPPLIES) IMPLANT
CATH EMBOLECTOMY 5FR (BALLOONS) IMPLANT
CATH INDIGO 7D KIT (CATHETERS) IMPLANT
CATH INDIGO SEP 7D (CATHETERS) IMPLANT
CATH THROMBEC 7F 65 CLEANER15 (CATHETERS) IMPLANT
COVER PROBE ULTRASOUND 5X96 (MISCELLANEOUS) IMPLANT
DERMABOND ADVANCED .7 DNX12 (GAUZE/BANDAGES/DRESSINGS) IMPLANT
DEVICE PRESTO INFLATION (MISCELLANEOUS) IMPLANT
DRAPE BRACHIAL (DRAPES) IMPLANT
KIT MICROPUNCTURE VSI 5F STIFF (SHEATH) IMPLANT
PACK ANGIOGRAPHY (CUSTOM PROCEDURE TRAY) ×2 IMPLANT
SHEATH BRITE TIP 6FRX5.5 (SHEATH) IMPLANT
SHEATH BRITE TIP 7FRX5.5 (SHEATH) IMPLANT
SUT MNCRL AB 4-0 PS2 18 (SUTURE) IMPLANT
SUT PROLENE 0 CT 1 30 (SUTURE) IMPLANT
WIRE SUPRACORE 190CM (WIRE) IMPLANT

## 2023-12-15 NOTE — Plan of Care (Signed)

## 2023-12-15 NOTE — Op Note (Signed)
 OPERATIVE NOTE    PRE-OPERATIVE DIAGNOSIS: 1. ESRD 2. Thrombosed and unsalvageable left arm AVG  POST-OPERATIVE DIAGNOSIS: same as above  PROCEDURE: Ultrasound guidance for vascular access to the right internal jugular vein Fluoroscopic guidance for placement of catheter Placement of a 23 cm tip to cuff tunneled hemodialysis catheter via the right internal jugular vein  SURGEON: Mikki Alexander, MD  ANESTHESIA:  Local with Moderate conscious sedation for approximately 98  minutes using 2 mg of Versed and 50 mcg of Fentanyl (for both procedures)  ESTIMATED BLOOD LOSS: 5 cc  FLUORO TIME: less than one minute  CONTRAST: none  FINDING(S): 1.  Patent right internal jugular vein  SPECIMEN(S):  None  INDICATIONS:   [name] is a 53 y.o.male who presents with end-stage renal disease and a thrombosed left arm AV graft that we were not able to get opened today.  The patient needs long term dialysis access for their ESRD, and a Permcath is necessary.  Risks and benefits are discussed and informed consent is obtained.    DESCRIPTION: After obtaining full informed written consent, the patient was brought back to the vascular suited. The patient's right neck and chest were sterilely prepped and draped in a sterile surgical field was created. Moderate conscious sedation was administered during a face to face encounter with the patient throughout the procedure with my supervision of the RN administering medicines and monitoring the patient's vital signs, pulse oximetry, telemetry and mental status throughout from the start of the procedure until the patient was taken to the recovery room.  The right internal jugular vein was visualized with ultrasound and found to be patent. It was then accessed under direct ultrasound guidance and a permanent image was recorded. A wire was placed. After skin nick and dilatation, the peel-away sheath was placed over the wire. I then turned my attention to an area  under the clavicle. Approximately 1-2 fingerbreadths below the clavicle a small counterincision was created and tunneled from the subclavicular incision to the access site. Using fluoroscopic guidance, a 23 centimeter tip to cuff tunneled hemodialysis catheter was selected, and tunneled from the subclavicular incision to the access site. It was then placed through the peel-away sheath and the peel-away sheath was removed. Using fluoroscopic guidance the catheter tips were parked in the right atrium. The appropriate distal connectors were placed. It withdrew blood well and flushed easily with heparinized saline and a concentrated heparin solution was then placed. It was secured to the chest wall with 2 Prolene sutures. The access incision was closed single 4-0 Monocryl. A 4-0 Monocryl pursestring suture was placed around the exit site. Sterile dressings were placed. The patient tolerated the procedure well and was taken to the recovery room in stable condition.  COMPLICATIONS: None  CONDITION: Stable  Mikki Alexander, MD 12/15/2023 2:48 PM   This note was created with Dragon Medical transcription system. Any errors in dictation are purely unintentional.

## 2023-12-15 NOTE — Progress Notes (Signed)
 PHARMACY - ANTICOAGULATION CONSULT NOTE  Pharmacy Consult for heparin infusion Indication: h/o PVD  Allergies  Allergen Reactions   Lisinopril Swelling    Lips mouth    Furosemide Cough    Light headed, blurry vision, weakness.   Latex Itching   Silicone Itching   Adhesive [Tape] Itching and Rash    Patient Measurements: 178.7 kg (recorded 11/30/23) HDW: 129.7 kg Weight: (!) 182.2 kg (401 lb 10.9 oz)  Vital Signs: Temp: 98.1 F (36.7 C) (04/16 2104) BP: 154/87 (04/16 2104) Pulse Rate: 71 (04/16 2104)  Labs: Recent Labs    12/12/23 1518 12/12/23 1518 12/12/23 2355 12/13/23 0432 12/13/23 1036 12/13/23 2120 12/14/23 0533 12/15/23 0345  HGB 13.8  --   --  12.3*  --   --  12.4* 12.4*  HCT 42.2  --   --  37.8*  --   --  37.3* 38.0*  PLT 147*  --   --  112*  --   --  116* 119*  APTT 35  --   --   --  66*  --   --   --   LABPROT  --   --  15.3*  --   --   --   --   --   INR  --   --  1.2  --   --   --   --   --   HEPARINUNFRC  --    < > 0.59  --  0.27* 0.56 0.40 0.32  CREATININE 21.60*  --   --  22.51*  --   --  20.02* 15.98*   < > = values in this interval not displayed.    Estimated Creatinine Clearance: 9.3 mL/min (A) (by C-G formula based on SCr of 15.98 mg/dL (H)).   Medical History: Past Medical History:  Diagnosis Date   Anemia    Chronic kidney disease    14% FUNCTION   Diabetes mellitus without complication (HCC)    Diabetic retinopathy (HCC)    Dyspnea    DOE   Dysrhythmia    End stage renal disease (HCC)    History of orthopnea    error wrong patietn   Hypercholesteremia    error   Hyperkalemia    error wrong patient   Hypertension    Long-term insulin use (HCC)    Lymphedema of leg    Metabolic encephalopathy    error   Microalbuminuria    MSSA (methicillin susceptible Staphylococcus aureus)    Obesity    PVD (peripheral vascular disease) (HCC)    Respiratory failure (HCC)    Error   Sepsis (HCC)    error   Sleep apnea    no CPAP    UTI (urinary tract infection)    error   Venous ulcer (HCC)    Vitamin D deficiency     Assessment: 53 y.o. male with medical history significant of  ESRD-HD (TTS), HTN. HLD, DM, COPD, PVD on Eliquis, gout, endocarditis, lymphedema, obesity, who presents with AV fistula occlusion in left arm. Platelets LEN at baseline. Last dose of apixaban 12/11/23 am   Goal of Therapy:  anti-Xa level 0.3-0.7 units/ml aPTT 66 - 102 seconds Monitor platelets by anticoagulation protocol: Yes   4/15@1036 : HL 0.27, aPTT 66,Subtherapeutic and correlating 4/15@2120 : HL 0.56, therapeutic x 1 4/16 0533 HL 0.40, therapeutic x 2 4/17 0345 HL 0.32, therapeutic x 3  Plan: Continue heparin infusion rate at 1800 units/hour. Recheck heparin level daily with morning labs.  Continue to monitor CBC daily while on heparin infusion.  Coretta Dexter, PharmD, [facility] 12/15/2023 5:13 AM

## 2023-12-15 NOTE — Interval H&P Note (Signed)
 History and Physical Interval Note:  12/15/2023 11:57 AM  [name]  has presented today for surgery, with the diagnosis of ESRD.  The various methods of treatment have been discussed with the patient and family. After consideration of risks, benefits and other options for treatment, the patient has consented to  Procedure(s): A/V Fistulagram (Left) DIALYSIS/PERMA CATHETER INSERTION (Right) as a surgical intervention.  The patient's history has been reviewed, patient examined, no change in status, stable for surgery.  I have reviewed the patient's chart and labs.  Questions were answered to the patient's satisfaction.     [name]

## 2023-12-15 NOTE — Progress Notes (Signed)
 Dr Vonna Guardian made aware of patient's elevated BP, no interventions at this time

## 2023-12-15 NOTE — Progress Notes (Signed)
 Hemodialysis note  Received patient in bed to unit. Alert and oriented.  Informed consent signed and in chart.  Tx duration: 3.5 hours  Patient tolerated well. Transported back to room, alert without acute distress.  Report given to patient's RN.   Access used: Right Chest HD Catheter Access issues: high venous pressure. Lines were reversed  Total UF removed: 3L (due to BP trending down) Medication(s) given:  Oxycodone 5 mg tab PO  Post HD weight: 182.6 kg   [name] [name]

## 2023-12-15 NOTE — Progress Notes (Signed)
 PROGRESS NOTE    [name]   WUJ:[id number] DOB: 1971/01/20  DOA: 12/12/2023 Date of Service: 12/15/23 which is hospital day 2  PCP: Marina Goodell, MD    Hospital course / significant events:   HPI: This is a 53 y.o. male with medical history significant of  ESRD-HD (TTS), HTN. HLD, DM, COPD, PVD on Eliquis, gout, endocarditis, lymphedema, obesity, who presents with AV fistula occlusion in left arm. Pt states that he had dialysis on Tuesday 04/08. He went to dialysis on Thursday 04/10, and was told that his fistula was clotted and they were unable to use HD.  He did not try to do dialysis on Saturday 04/12.    04/14: admitted to hospitalist service evening  04/15: temp cath placed. HD today, 2 L off, had to be cut short d/t GI issues 04/16: continue w/ HD today, 3.5 L off. Fistulagram planned for tomorrow.  04/17Kathee Delton Fistulagram today     Consultants:  Nephrology Vascular surgery   Procedures/Surgeries: 04/15: placement temporary HD catheter R femoral vein - Dr Wyn Quaker 04/17: AV Fistulagram - Dr Wyn Quaker      ASSESSMENT & PLAN:   AV fistula occlusion:  Vascular following  A/V fistulagram 04/17 today  Hold home Eliquis, continue IV heparin pharmacy to dose    Hypertension:   IV hydralazine as needed.  resume amlodipine if needed following dialysis.  He has been prescribed atenolol but does not take it currently will hold for now    End stage renal disease on HD (TTS):  Nephrology assistance appreciated  DC pending reliable HD access / pending fistulagram    Type 1 diabetes mellitus with renal complication and with hyperglycemia Recent A1c 8.8, poorly controlled Glargine insulin 20 units daily (takes 30 units at home)  Sliding scale insulin Consistent carb diet    Gout Allopurinol   HLD (hyperlipidemia) Lipitor   COPD (chronic obstructive pulmonary disease): Stable Bronchodilators as needed   PVD (peripheral vascular disease)  Continue Lipitor Hold  home Eliquis, continue IV heparin pharmacy to dose  hold Plavix temporarily    Super Morbid Obesity based on BMI 50 Underweight - under 18  overweight - 25 to 29 obese - 30 or more Class 1 obesity: BMI of 30.0 to 34 Class 2 obesity: BMI of 35.0 to 39 Class 3 obesity: BMI of 40.0 to 49 Super Morbid Obesity: BMI 50-59 Super-super Morbid Obesity: BMI 60+ Significantly low or high BMI is associated with higher medical risk.  Weight management advised as adjunct to other disease management and risk reduction treatments    DVT prophylaxis: heparin IV fluids: no continuous IV fluids  Nutrition: cardiac/carb diet  Central lines / other devices: temp cath R femoral   Code Status: full code ACP documentation reviewed: none on file in [facility]  TOC needs: TBD Medical barriers to dispo: continuing dialysis. Expected medical readiness for discharge tomorrow or day after / pend fistulagram .              Subjective / Brief ROS:  Patient reports no concerns at this time Breathing is better today  Denies CP/SOB.  Pain controlled.  Denies new weakness.  Tolerating diet.   Family Communication: none at this time     Objective Findings:  Vitals:   12/14/23 2104 12/15/23 0542 12/15/23 0900 12/15/23 1143  BP: (!) 154/87 (!) 160/77 (!) 152/47 (!) 138/117  Pulse: 71 67 61 (!) 56  Resp: 19 20  (!) 24  Temp: 98.1 F (  36.7 C) 98.3 F (36.8 C) 98 F (36.7 C) 97.9 F (36.6 C)  TempSrc:    Oral  SpO2: 95% 98% 99% 98%  Weight:        Intake/Output Summary (Last 24 hours) at 12/15/2023 1414 Last data filed at 12/14/2023 1700 Gross per 24 hour  Intake --  Output 3500 ml  Net -3500 ml   Filed Weights   12/13/23 1842 12/14/23 1317 12/14/23 1700  Weight: (!) 185.5 kg (!) 185.7 kg (!) 182.2 kg    Examination:  Physical Exam Constitutional:      General: He is not in acute distress.    Appearance: He is obese. He is not ill-appearing.  Cardiovascular:     Rate and  Rhythm: Normal rate and regular rhythm.  Pulmonary:     Effort: Pulmonary effort is normal.     Breath sounds: Normal breath sounds.  Musculoskeletal:     Comments: LE wrapped bilaterally, wraps were not removed, no proximal erythema/edema at knees   Neurological:     General: No focal deficit present.     Mental Status: He is alert and oriented to person, place, and time. Mental status is at baseline.  Psychiatric:        Mood and Affect: Mood normal.        Behavior: Behavior normal.          Scheduled Medications:   [MAR Hold] allopurinol  100 mg Oral Daily   [MAR Hold] atorvastatin  80 mg Oral QHS   [MAR Hold] calcium acetate  2,001 mg Oral TID WC   [MAR Hold] Chlorhexidine Gluconate Cloth  6 each Topical Q0600   [MAR Hold] cinacalcet  30 mg Oral Q breakfast   [MAR Hold] insulin aspart  0-5 Units Subcutaneous QHS   [MAR Hold] insulin aspart  0-9 Units Subcutaneous TID WC   [MAR Hold] insulin glargine-yfgn  20 Units Subcutaneous Daily   [MAR Hold] multivitamin  1 tablet Oral Daily   [MAR Hold] multivitamin  1 tablet Oral Once per day on Monday Wednesday Friday   [employer] Hold] multivitamin with minerals  1 tablet Oral Once per day on Monday Wednesday Friday    Continuous Infusions:  sodium chloride     heparin 1,800 Units/hr (12/14/23 1744)    PRN Medications:  [MAR Hold] acetaminophen, [MAR Hold] albuterol, alteplase, [MAR Hold] calcium acetate **AND** [MAR Hold] calcium acetate, [MAR Hold] dextromethorphan-guaiFENesin, diphenhydrAMINE, famotidine, fentaNYL (SUBLIMAZE) injection, fentaNYL, heparin, heparin sodium (porcine), [MAR Hold] hydrALAZINE, methylPREDNISolone (SOLU-MEDROL) injection, midazolam, midazolam, [MAR Hold] ondansetron (ZOFRAN) IV  Antimicrobials from admission:  Anti-infectives (From admission, onward)    Start     Dose/Rate Route Frequency Ordered Stop   12/15/23 0000  ceFAZolin (ANCEF) IVPB 1 g/50 mL premix        1 g 100 mL/hr over 30 Minutes  Intravenous 30 min pre-op 12/14/23 1756 12/15/23 1316           Data Reviewed:  I have personally reviewed the following...  CBC: Recent Labs  Lab 12/12/23 1518 12/13/23 0432 12/14/23 0533 12/15/23 0345  WBC 6.3 4.8 4.4 3.9*  NEUTROABS 4.4  --   --   --   HGB 13.8 12.3* 12.4* 12.4*  HCT 42.2 37.8* 37.3* 38.0*  MCV 101.0* 102.4* 99.7 100.8*  PLT 147* 112* 116* 119*   Basic Metabolic Panel: Recent Labs  Lab 12/12/23 1518 12/13/23 0432 12/14/23 0532 12/14/23 0533 12/15/23 0345  NA 141 143  --  139 135  K  5.0 5.2*  --  4.7 4.4  CL 104 106  --  104 99  CO2 20* 22  --  21* 22  GLUCOSE 116* 183*  --  73 225*  BUN 115* 124*  --  102* 77*  CREATININE 21.60* 22.51*  --  20.02* 15.98*  CALCIUM 9.2 8.7*  --  8.5* 8.6*  PHOS  --   --  7.7*  --   --    GFR: Estimated Creatinine Clearance: 9.3 mL/min (A) (by C-G formula based on SCr of 15.98 mg/dL (H)). Liver Function Tests: Recent Labs  Lab 12/12/23 1518  AST 9*  ALT 13  ALKPHOS 51  BILITOT 1.0  PROT 7.5  ALBUMIN 3.9   No results for input(s): "LIPASE", "AMYLASE" in the last 168 hours. No results for input(s): "AMMONIA" in the last 168 hours. Coagulation Profile: Recent Labs  Lab 12/12/23 2355  INR 1.2   Cardiac Enzymes: No results for input(s): "CKTOTAL", "CKMB", "CKMBINDEX", "TROPONINI" in the last 168 hours. BNP (last 3 results) No results for input(s): "PROBNP" in the last 8760 hours. HbA1C: No results for input(s): "HGBA1C" in the last 72 hours. CBG: Recent Labs  Lab 12/14/23 1738 12/14/23 2105 12/15/23 0821 12/15/23 1119 12/15/23 1224  GLUCAP 93 199* 138* 76 88   Lipid Profile: No results for input(s): "CHOL", "HDL", "LDLCALC", "TRIG", "CHOLHDL", "LDLDIRECT" in the last 72 hours. Thyroid Function Tests: No results for input(s): "TSH", "T4TOTAL", "FREET4", "T3FREE", "THYROIDAB" in the last 72 hours. Anemia Panel: No results for input(s): "VITAMINB12", "FOLATE", "FERRITIN", "TIBC", "IRON",  "RETICCTPCT" in the last 72 hours. Most Recent Urinalysis On File:     Component Value Date/Time   COLORURINE YELLOW (A) 10/03/2017 1120   APPEARANCEUR CLOUDY (A) 10/03/2017 1120   LABSPEC 1.015 10/03/2017 1120   PHURINE 5.0 10/03/2017 1120   GLUCOSEU NEGATIVE 10/03/2017 1120   HGBUR SMALL (A) 10/03/2017 1120   BILIRUBINUR NEGATIVE 10/03/2017 1120   KETONESUR NEGATIVE 10/03/2017 1120   PROTEINUR 30 (A) 10/03/2017 1120   NITRITE NEGATIVE 10/03/2017 1120   LEUKOCYTESUR NEGATIVE 10/03/2017 1120   Sepsis Labs: @LABRCNTIP (procalcitonin:4,lacticidven:4) Microbiology: No results found for this or any previous visit (from the past 240 hours).    Radiology Studies last 3 days: PERIPHERAL VASCULAR CATHETERIZATION Result Date: 12/13/2023 See surgical note for result.       [name] Likins, DO [facility] 12/15/2023, 2:14 PM    Dictation software may have been used to generate the above note. Typos may occur and escape review in typed/dictated notes. Please contact Dr Authur Leghorn directly for clarity if needed.  Staff may message me via secure chat in Epic  but this may not receive an immediate response,  please page me for urgent matters!  If 7PM-7AM, please contact night coverage [email or web address]

## 2023-12-15 NOTE — Progress Notes (Signed)
 [facility]  ROUNDING NOTE   Subjective:   [name] is known to our practice from outpatient dialysis.  He dialyzes at [facility].   Patient presents to the emergency department for evaluation of a clotted dialysis access.    Patient resting quietly Currently NPO for vascular procedure.  Room air, denies shortness of breath  Objective:  Vital signs in last 24 hours:  Temp:  [97.7 F (36.5 C)-98.3 F (36.8 C)] 98 F (36.7 C) (04/17 0900) Pulse Rate:  [57-77] 61 (04/17 0900) Resp:  [14-20] 20 (04/17 0542) BP: (137-177)/(47-111) 152/47 (04/17 0900) SpO2:  [95 %-99 %] 99 % (04/17 0900) Weight:  [182.2 kg-185.7 kg] 182.2 kg (04/16 1700)  Weight change: -1.8 kg Filed Weights   12/13/23 1842 12/14/23 1317 12/14/23 1700  Weight: (!) 185.5 kg (!) 185.7 kg (!) 182.2 kg    Intake/Output: I/O last 3 completed shifts: In: -  Out: 3500 [Other:3500]   Intake/Output this shift:  No intake/output data recorded.  Physical Exam: General: NAD  Head: Normocephalic, atraumatic. Moist oral mucosal membranes  Eyes: Anicteric  Lungs:  Clear to auscultation  Heart: Regular rate and rhythm  Abdomen:  Soft, nontender,   Extremities: +++ Peripheral edema. Compression hose  Neurologic: Alert, moving all four extremities  Skin: No lesions  Access: Left aVF (-bruit or thrill) right femoral HD temp cath placed on 4/15    Basic Metabolic Panel: Recent Labs  Lab 12/12/23 1518 12/13/23 0432 12/14/23 0532 12/14/23 0533 12/15/23 0345  NA 141 143  --  139 135  K 5.0 5.2*  --  4.7 4.4  CL 104 106  --  104 99  CO2 20* 22  --  21* 22  GLUCOSE 116* 183*  --  73 225*  BUN 115* 124*  --  102* 77*  CREATININE 21.60* 22.51*  --  20.02* 15.98*  CALCIUM 9.2 8.7*  --  8.5* 8.6*  PHOS  --   --  7.7*  --   --     Liver Function Tests: Recent Labs  Lab 12/12/23 1518  AST 9*  ALT 13  ALKPHOS 51  BILITOT 1.0  PROT 7.5  ALBUMIN 3.9   No results for input(s): "LIPASE",  "AMYLASE" in the last 168 hours. No results for input(s): "AMMONIA" in the last 168 hours.  CBC: Recent Labs  Lab 12/12/23 1518 12/13/23 0432 12/14/23 0533 12/15/23 0345  WBC 6.3 4.8 4.4 3.9*  NEUTROABS 4.4  --   --   --   HGB 13.8 12.3* 12.4* 12.4*  HCT 42.2 37.8* 37.3* 38.0*  MCV 101.0* 102.4* 99.7 100.8*  PLT 147* 112* 116* 119*    Cardiac Enzymes: No results for input(s): "CKTOTAL", "CKMB", "CKMBINDEX", "TROPONINI" in the last 168 hours.  BNP: Invalid input(s): "POCBNP"  CBG: Recent Labs  Lab 12/14/23 1159 12/14/23 1738 12/14/23 2105 12/15/23 0821 12/15/23 1119  GLUCAP 142* 93 199* 138* 76    Microbiology: Results for orders placed or performed during the hospital encounter of 03/24/23  Culture, blood (Routine x 2)     Status: Abnormal   Collection Time: 03/24/23  9:41 AM   Specimen: BLOOD  Result Value Ref Range Status   Specimen Description   Final    BLOOD BLOOD RIGHT ARM Performed at [facility], [address], [place], [place] [address]    Special Requests   Final    BOTTLES DRAWN AEROBIC AND ANAEROBIC Blood Culture results may not be optimal due to  an excessive volume of blood received in culture bottles Performed at [facility], [address] [place], [place] [address]    Culture  Setup Time   Final    Organism ID to follow GRAM POSITIVE COCCI IN BOTH AEROBIC AND ANAEROBIC BOTTLES CRITICAL RESULT CALLED TO, READ BACK BY AND VERIFIED WITH: NATHAN BELEU AT 2141 ON 03/24/23 BY SS Performed at [facility], [address] [place], [place] [address]    Culture STREPTOCOCCUS GROUP G (A)  Final   Report Status 03/27/2023 FINAL  Final   Organism ID, Bacteria STREPTOCOCCUS GROUP G  Final      Susceptibility   Streptococcus group g - MIC*    CLINDAMYCIN RESISTANT Resistant     AMPICILLIN <=0.25 SENSITIVE Sensitive     ERYTHROMYCIN >=8 RESISTANT Resistant     VANCOMYCIN 0.5 SENSITIVE Sensitive     CEFTRIAXONE <=0.12  SENSITIVE Sensitive     LEVOFLOXACIN 0.5 SENSITIVE Sensitive     PENICILLIN <=0.06 SENSITIVE Sensitive     * STREPTOCOCCUS GROUP G  Blood Culture ID Panel (Reflexed)     Status: Abnormal   Collection Time: 03/24/23  9:41 AM  Result Value Ref Range Status   Enterococcus faecalis NOT DETECTED NOT DETECTED Final   Enterococcus Faecium NOT DETECTED NOT DETECTED Final   Listeria monocytogenes NOT DETECTED NOT DETECTED Final   Staphylococcus species NOT DETECTED NOT DETECTED Final   Staphylococcus aureus (BCID) NOT DETECTED NOT DETECTED Final   Staphylococcus epidermidis NOT DETECTED NOT DETECTED Final   Staphylococcus lugdunensis NOT DETECTED NOT DETECTED Final   Streptococcus species DETECTED (A) NOT DETECTED Final    Comment: Not Enterococcus species, Streptococcus agalactiae, Streptococcus pyogenes, or Streptococcus pneumoniae. CRITICAL RESULT CALLED TO, READ BACK BY AND VERIFIED WITH: NATHAN BELUE AT 2141 ON 03/24/23 BY SS    Streptococcus agalactiae NOT DETECTED NOT DETECTED Final   Streptococcus pneumoniae NOT DETECTED NOT DETECTED Final   Streptococcus pyogenes NOT DETECTED NOT DETECTED Final   A.calcoaceticus-baumannii NOT DETECTED NOT DETECTED Final   Bacteroides fragilis NOT DETECTED NOT DETECTED Final   Enterobacterales NOT DETECTED NOT DETECTED Final   Enterobacter cloacae complex NOT DETECTED NOT DETECTED Final   Escherichia coli NOT DETECTED NOT DETECTED Final   Klebsiella aerogenes NOT DETECTED NOT DETECTED Final   Klebsiella oxytoca NOT DETECTED NOT DETECTED Final   Klebsiella pneumoniae NOT DETECTED NOT DETECTED Final   Proteus species NOT DETECTED NOT DETECTED Final   Salmonella species NOT DETECTED NOT DETECTED Final   Serratia marcescens NOT DETECTED NOT DETECTED Final   Haemophilus influenzae NOT DETECTED NOT DETECTED Final   Neisseria meningitidis NOT DETECTED NOT DETECTED Final   Pseudomonas aeruginosa NOT DETECTED NOT DETECTED Final   Stenotrophomonas maltophilia  NOT DETECTED NOT DETECTED Final   Candida albicans NOT DETECTED NOT DETECTED Final   Candida auris NOT DETECTED NOT DETECTED Final   Candida glabrata NOT DETECTED NOT DETECTED Final   Candida krusei NOT DETECTED NOT DETECTED Final   Candida parapsilosis NOT DETECTED NOT DETECTED Final   Candida tropicalis NOT DETECTED NOT DETECTED Final   Cryptococcus neoformans/gattii NOT DETECTED NOT DETECTED Final    Comment: Performed at [facility], [address] [place], [place] [address]  Culture, blood (Routine x 2)     Status: Abnormal   Collection Time: 03/24/23  9:55 AM   Specimen: BLOOD RIGHT ARM  Result Value Ref Range Status   Specimen Description   Final    BLOOD RIGHT ARM Performed  at [facility], [address], [place], [place] [address]    Special Requests   Final    BOTTLES DRAWN AEROBIC AND ANAEROBIC Blood Culture adequate volume Performed at [facility], [address] [place], [place] [address]    Culture  Setup Time   Final    GRAM POSITIVE COCCI IN BOTH AEROBIC AND ANAEROBIC BOTTLES CRITICAL VALUE NOTED.  VALUE IS CONSISTENT WITH PREVIOUSLY REPORTED AND CALLED VALUE. Performed at [facility], [address] [place], [place] [address]    Culture (A)  Final    STREPTOCOCCUS GROUP G SUSCEPTIBILITIES PERFORMED ON PREVIOUS CULTURE WITHIN THE LAST 5 DAYS. Performed at [facility], [address], [place], [place] [address]    Report Status 03/27/2023 FINAL  Final  Resp panel by RT-PCR (RSV, Flu A&B, Covid) Anterior Nasal Swab     Status: None   Collection Time: 03/24/23  9:55 AM   Specimen: Anterior Nasal Swab  Result Value Ref Range Status   SARS Coronavirus 2 by RT PCR NEGATIVE NEGATIVE Final    Comment: (NOTE) SARS-CoV-2 target nucleic acids are NOT DETECTED.  The SARS-CoV-2 RNA is generally detectable in upper respiratory specimens during the acute phase of infection. The lowest concentration of SARS-CoV-2 viral  copies this assay can detect is 138 copies/mL. A negative result does not preclude SARS-Cov-2 infection and should not be used as the sole basis for treatment or other patient management decisions. A negative result may occur with  improper specimen collection/handling, submission of specimen other than nasopharyngeal swab, presence of viral mutation(s) within the areas targeted by this assay, and inadequate number of viral copies(<138 copies/mL). A negative result must be combined with clinical observations, patient history, and epidemiological information. The expected result is Negative.  Fact Sheet for Patients:  BloggerCourse.com  Fact Sheet for Healthcare Providers:  SeriousBroker.it  This test is no t yet approved or cleared by the [place]  FDA and  has been authorized for detection and/or diagnosis of SARS-CoV-2 by FDA under an Emergency Use Authorization (EUA). This EUA will remain  in effect (meaning this test can be used) for the duration of the COVID-19 declaration under Section 564(b)(1) of the Act, 21 U.S.C.section 360bbb-3(b)(1), unless the authorization is terminated  or revoked sooner.       Influenza A by PCR NEGATIVE NEGATIVE Final   Influenza B by PCR NEGATIVE NEGATIVE Final    Comment: (NOTE) The Xpert Xpress SARS-CoV-2/FLU/RSV plus assay is intended as an aid in the diagnosis of influenza from Nasopharyngeal swab specimens and should not be used as a sole basis for treatment. Nasal washings and aspirates are unacceptable for Xpert Xpress SARS-CoV-2/FLU/RSV testing.  Fact Sheet for Patients: BloggerCourse.com  Fact Sheet for Healthcare Providers: SeriousBroker.it  This test is not yet approved or cleared by the [place]  FDA and has been authorized for detection and/or diagnosis of SARS-CoV-2 by FDA under an Emergency Use Authorization (EUA). This  EUA will remain in effect (meaning this test can be used) for the duration of the COVID-19 declaration under Section 564(b)(1) of the Act, 21 U.S.C. section 360bbb-3(b)(1), unless the authorization is terminated or revoked.     Resp Syncytial Virus by PCR NEGATIVE NEGATIVE Final    Comment: (NOTE) Fact Sheet for Patients: BloggerCourse.com  Fact Sheet for Healthcare Providers: SeriousBroker.it  This test is not yet approved or cleared by the [place]  FDA and has been authorized for detection and/or diagnosis of SARS-CoV-2 by FDA under  an Emergency Use Authorization (EUA). This EUA will remain in effect (meaning this test can be used) for the duration of the COVID-19 declaration under Section 564(b)(1) of the Act, 21 U.S.C. section 360bbb-3(b)(1), unless the authorization is terminated or revoked.  Performed at [facility], [address] [place], [place] [address]   Aerobic Culture w Gram Stain (superficial specimen)     Status: None   Collection Time: 03/25/23  6:50 PM   Specimen: Wound  Result Value Ref Range Status   Specimen Description   Final    WOUND Performed at [facility], [address], [place], [place] [address]    Special Requests   Final    NONE Performed at [facility], [address] [place], [place] [address]    Gram Stain   Final    FEW WBC PRESENT, PREDOMINANTLY PMN RARE GRAM POSITIVE RODS RARE GRAM POSITIVE COCCI    Culture   Final    FEW ESCHERICHIA COLI MODERATE CORYNEBACTERIUM SPECIES Standardized susceptibility testing for this organism is not available. Performed at [facility], [address] [place], [place] [address]    Report Status 03/29/2023 FINAL  Final   Organism ID, Bacteria ESCHERICHIA COLI  Final      Susceptibility   Escherichia coli - MIC*    AMPICILLIN 4 SENSITIVE Sensitive     CEFEPIME <=0.12 SENSITIVE Sensitive      CEFTAZIDIME <=1 SENSITIVE Sensitive     CEFTRIAXONE <=0.25 SENSITIVE Sensitive     CIPROFLOXACIN 0.5 INTERMEDIATE Intermediate     GENTAMICIN <=1 SENSITIVE Sensitive     IMIPENEM <=0.25 SENSITIVE Sensitive     TRIMETH/SULFA >=320 RESISTANT Resistant     AMPICILLIN/SULBACTAM <=2 SENSITIVE Sensitive     PIP/TAZO <=4 SENSITIVE Sensitive     * FEW ESCHERICHIA COLI  Culture, blood (Routine X 2) w Reflex to ID Panel     Status: None   Collection Time: 03/26/23  5:06 AM   Specimen: BLOOD  Result Value Ref Range Status   Specimen Description BLOOD BLOOD RIGHT ARM  Final   Special Requests   Final    BOTTLES DRAWN AEROBIC AND ANAEROBIC Blood Culture adequate volume   Culture   Final    NO GROWTH 5 DAYS Performed at [facility], [address] [place], [place] [address]    Report Status 03/31/2023 FINAL  Final  Culture, blood (Routine X 2) w Reflex to ID Panel     Status: None   Collection Time: 03/26/23  5:06 AM   Specimen: BLOOD  Result Value Ref Range Status   Specimen Description BLOOD BLOOD RIGHT HAND  Final   Special Requests   Final    BOTTLES DRAWN AEROBIC ONLY Blood Culture adequate volume   Culture   Final    NO GROWTH 5 DAYS Performed at [facility], [address] [place], [place] [address]    Report Status 03/31/2023 FINAL  Final    Coagulation Studies: Recent Labs    12/12/23 2355  LABPROT 15.3*  INR 1.2    Urinalysis: No results for input(s): "COLORURINE", "LABSPEC", "PHURINE", "GLUCOSEU", "HGBUR", "BILIRUBINUR", "KETONESUR", "PROTEINUR", "UROBILINOGEN", "NITRITE", "LEUKOCYTESUR" in the last 72 hours.  Invalid input(s): "APPERANCEUR"    Imaging: PERIPHERAL VASCULAR CATHETERIZATION Result Date: 12/13/2023 See surgical note for result.    Medications:    sodium chloride      ceFAZolin (ANCEF) IV     heparin 1,800 Units/hr (12/14/23 1744)    Evette Hoes  Hold] allopurinol  100 mg Oral Daily   [MAR Hold] atorvastatin  80 mg Oral QHS    [MAR Hold] calcium acetate  2,001 mg Oral TID WC   [MAR Hold] Chlorhexidine Gluconate Cloth  6 each Topical Q0600   [MAR Hold] cinacalcet  30 mg Oral Q breakfast   [MAR Hold] insulin aspart  0-5 Units Subcutaneous QHS   [MAR Hold] insulin aspart  0-9 Units Subcutaneous TID WC   [MAR Hold] insulin glargine-yfgn  20 Units Subcutaneous Daily   [MAR Hold] multivitamin  1 tablet Oral Daily   [MAR Hold] multivitamin  1 tablet Oral Once per day on Monday Wednesday Friday   [facility] Hold] multivitamin with minerals  1 tablet Oral Once per day on Monday Wednesday Friday   [MAR Hold] acetaminophen, [MAR Hold] albuterol, [MAR Hold] calcium acetate **AND** [MAR Hold] calcium acetate, [MAR Hold] dextromethorphan-guaiFENesin, diphenhydrAMINE, famotidine, fentaNYL (SUBLIMAZE) injection, [MAR Hold] hydrALAZINE, methylPREDNISolone (SOLU-MEDROL) injection, midazolam, [MAR Hold] ondansetron (ZOFRAN) IV  Assessment/ Plan:  [name] is a 53 y.o.  male  with  ESRD-HD (TTS), HTN. HJLD, DM, COPD, PVD on Eliquis, gout, obesity, chronic ulcer in left leg and foot.   [facility]/TTS/left aVF  Malfunctioning dialysis access, left aVF without bruit or thrill.  Appreciate vascular surgery placing HD temp catheter 4/15.  Vascular to perform fistulogram today.   2.  End-stage renal disease on hemodialysis.  Received dialysis yesterday with UF 3.5L. Will perform dialysis later today after procedure.   3. Anemia of chronic kidney disease Lab Results  Component Value Date   HGB 12.4 (L) 12/15/2023    Hemoglobin within desired goal.  Will continue to monitor.  4. Secondary Hyperparathyroidism: with outpatient labs: Unavailable  Lab Results  Component Value Date   CALCIUM 8.6 (L) 12/15/2023   CAION 1.14 (L) 11/12/2022   PHOS 7.7 (H) 12/14/2023    Continue Cinacalcet and calcium acetate as ordered.  Calcium acceptable.  Phosphorus 7.7, continue calcium acetate with meals.    LOS: 2 Argelio Granier 4/17/202511:43 AM

## 2023-12-15 NOTE — Op Note (Signed)
 [facility]    OPERATIVE NOTE   PROCEDURE: 1.  Left forearm loop arteriovenous graft cannulation under ultrasound guidance in both a retrograde and then antegrade fashion crossing 2.  Left arm shuntogram and central venogram 3.  Fogarty embolectomy for arterial plug 4.  Mechanical thrombectomy to the left loop forearm AV graft, brachial and axillary veins with the penumbra CAT 7D catheter as well as the 7 [place] cleaner device 5.  Percutaneous transluminal angioplasty of arterial anastomosis in the proximal and midportion of the AV graft with a 7 mm diameter high-pressure angioplasty balloon   PRE-OPERATIVE DIAGNOSIS: 1. ESRD 2.  Thrombosed left loop forearm arteriovenous graft  POST-OPERATIVE DIAGNOSIS: same as above   SURGEON: Festus Barren, MD  ANESTHESIA: local with Moderate Conscious Sedation for approximately 98 minutes using 2 mg of Versed and 50 mcg of Fentanyl (for both procedures)  ESTIMATED BLOOD LOSS: 475 cc  FINDING(S): Thrombosed graft and venous outflow, unable to be opened and requiring PermCath placement  SPECIMEN(S):  None  CONTRAST: 35 cc  FLUORO TIME: 11.8 minutes  INDICATIONS: Patient is a 53 y.o.male who presents with a thrombosed left loop forearm arteriovenous graft.  The patient is scheduled for an attempted declot and shuntogram.  The patient is aware the risks include but are not limited to: bleeding, infection, thrombosis of the cannulated access, and possible anaphylactic reaction to the contrast.  The patient is aware of the risks of the procedure and elects to proceed forward.  DESCRIPTION: After full informed written consent was obtained, the patient was brought back to the angiography suite and placed supine upon the angiography table.  The patient was connected to monitoring equipment. Moderate conscious sedation was administered with a face to face encounter with the patient throughout the procedure with my supervision of  the RN administering medicines and monitoring the patient's vital signs, pulse oximetry, telemetry and mental status throughout from the start of the procedure until the patient was taken to the recovery room. The left arm was prepped and draped in the standard fashion for a percutaneous access intervention.  Under ultrasound guidance, the left loop forearm arteriovenous graft was cannulated with a micropuncture needle under direct ultrasound guidance due to the pulseless nature of the graft in both an antegrade and a retrograde fashion crossing, and permanent images were performed.  The microwire was advanced and the needle was exchanged for the a microsheath.  I then upsized to a 7 Fr Sheath and imaging was performed.  Hand injections were completed to image the access including the central venous system. This demonstrated no flow within the AV graft.  Based on the images, this patient will need extensive treatment to salvage the graft. I then gave the patient 5000 units of intravenous heparin.  I then placed a Magic torque wire into the brachial artery from the retrograde sheath and into the axillary vein from the antegrade sheath. Mechanical thrombectomy using the penumbra 7D catheter was then performed throughout the graft and into the artery. This uncovered continued thrombosis of the graft.  A residual arterial plug was also seen at the arterial anastomosis. An attempt to clear the arterial plug was done with 3 passes of the Fogarty embolectomy balloon. Flow-limiting arterial plug remained, and I elected to treat this lesion with a 7 mm diameter high-pressure angioplasty balloon.  This balloon was also pulled back to treat the proximal and mid portions of the graft.  The distal portion of the graft and the outflow  veins remain thrombosed.  This resulted in resolution of the arterial plug, and clearance of the arterial side of the graft. The arterial outflow was seen to be intact distally.  It was actually  seen that this was based off of the radial artery and he had a high brachial bifurcation of his artery.  The retrograde sheath was removed. I then turned my attention to the thrombus in the distal graft and the brachial and axillary vein. Mechanical thrombectomy was performed both with the penumbra 7D catheter as well as the cleaner device. This resulted in continued thrombosis of the entire brachial and axillary vein up to a paired brachial vein joining the axillary vein at the shoulder.  It was fairly clear that this graft was not going to be able to be salvaged due to the extensive thrombosis of the venous outflow.  A PermCath would be required and this will be dictated separately.  A 4-0 Monocryl purse-string suture was sewn around the sheath.  The sheath was removed while tying down the suture.  A sterile bandage was applied to the puncture site.  COMPLICATIONS: None  CONDITION: Stable   Mikki Alexander 12/15/2023 2:39 PM   This note was created with Dragon Medical transcription system. Any errors in dictation are purely unintentional.

## 2023-12-16 ENCOUNTER — Other Ambulatory Visit: Payer: Self-pay

## 2023-12-16 ENCOUNTER — Other Ambulatory Visit (HOSPITAL_COMMUNITY): Payer: Self-pay

## 2023-12-16 LAB — CBC
HCT: 39.7 % (ref 39.0–52.0)
Hemoglobin: 13.3 g/dL (ref 13.0–17.0)
MCH: 32.8 pg (ref 26.0–34.0)
MCHC: 33.5 g/dL (ref 30.0–36.0)
MCV: 97.8 fL (ref 80.0–100.0)
Platelets: 102 10*3/uL — ABNORMAL LOW (ref 150–400)
RBC: 4.06 MIL/uL — ABNORMAL LOW (ref 4.22–5.81)
RDW: 13.9 % (ref 11.5–15.5)
WBC: 5.8 10*3/uL (ref 4.0–10.5)
nRBC: 0 % (ref 0.0–0.2)

## 2023-12-16 LAB — HEPARIN LEVEL (UNFRACTIONATED): Heparin Unfractionated: <0.1 [IU]/mL — ABNORMAL LOW (ref 0.30–0.70)

## 2023-12-16 LAB — GLUCOSE, CAPILLARY
Glucose-Capillary: 135 mg/dL — ABNORMAL HIGH (ref 70–99)
Glucose-Capillary: 142 mg/dL — ABNORMAL HIGH (ref 70–99)

## 2023-12-16 MED ORDER — APIXABAN 5 MG PO TABS
5.0000 mg | ORAL_TABLET | Freq: Two times a day (BID) | ORAL | 0 refills | Status: DC
  Filled 2023-12-16: qty 60, 30d supply, fill #0

## 2023-12-16 MED ORDER — CLOPIDOGREL BISULFATE 75 MG PO TABS
75.0000 mg | ORAL_TABLET | Freq: Every day | ORAL | 0 refills | Status: DC
  Filled 2023-12-16: qty 30, 30d supply, fill #0

## 2023-12-16 MED ORDER — APIXABAN 5 MG PO TABS
5.0000 mg | ORAL_TABLET | Freq: Two times a day (BID) | ORAL | Status: DC
  Administered 2023-12-16: 5 mg via ORAL
  Filled 2023-12-16: qty 1

## 2023-12-16 MED ORDER — HEPARIN BOLUS VIA INFUSION
3900.0000 [IU] | Freq: Once | INTRAVENOUS | Status: AC
  Administered 2023-12-16: 3900 [IU] via INTRAVENOUS
  Filled 2023-12-16: qty 3900

## 2023-12-16 NOTE — Progress Notes (Signed)
 PHARMACY - ANTICOAGULATION CONSULT NOTE  Pharmacy Consult for heparin  infusion Indication: h/o PVD  Allergies  Allergen Reactions   Lisinopril Swelling    Lips mouth    Furosemide  Cough    Light headed, blurry vision, weakness.   Latex Itching   Silicone Itching   Adhesive [Tape] Itching and Rash    Patient Measurements: 178.7 kg (recorded 11/30/23) HDW: 129.7 kg Weight: (!) 182.6 kg (402 lb 9 oz)  Vital Signs: Temp: 98.4 F (36.9 C) (04/18 0437) Temp Source: Oral (04/18 0437) BP: 156/94 (04/18 0437) Pulse Rate: 70 (04/18 0437)  Labs: Recent Labs    12/13/23 1036 12/13/23 2120 12/14/23 0533 12/15/23 0345 12/16/23 0422  HGB  --    < > 12.4* 12.4* 13.3  HCT  --   --  37.3* 38.0* 39.7  PLT  --   --  116* 119* 102*  APTT 66*  --   --   --   --   HEPARINUNFRC 0.27*   < > 0.40 0.32 <0.10*  CREATININE  --   --  20.02* 15.98*  --    < > = values in this interval not displayed.    Estimated Creatinine Clearance: 9.4 mL/min (A) (by C-G formula based on SCr of 15.98 mg/dL (H)).   Medical History: Past Medical History:  Diagnosis Date   Anemia    Chronic kidney disease    14% FUNCTION   Diabetes mellitus without complication (HCC)    Diabetic retinopathy (HCC)    Dyspnea    DOE   Dysrhythmia    End stage renal disease (HCC)    History of orthopnea    error wrong patietn   Hypercholesteremia    error   Hyperkalemia    error wrong patient   Hypertension    Long-term insulin  use (HCC)    Lymphedema of leg    Metabolic encephalopathy    error   Microalbuminuria    MSSA (methicillin susceptible Staphylococcus aureus)    Obesity    PVD (peripheral vascular disease) (HCC)    Respiratory failure (HCC)    Error   Sepsis (HCC)    error   Sleep apnea    no CPAP   UTI (urinary tract infection)    error   Venous ulcer (HCC)    Vitamin D deficiency     Assessment: 53 y.o. male with medical history significant of  ESRD-HD (TTS), HTN. HLD, DM, COPD, PVD on  Eliquis , gout, endocarditis, lymphedema, obesity, who presents with AV fistula occlusion in left arm. Platelets LEN at baseline. Last dose of apixaban  12/11/23 am   Goal of Therapy:  anti-Xa level 0.3-0.7 units/ml aPTT 66 - 102 seconds Monitor platelets by anticoagulation protocol: Yes   4/15@1036 : HL 0.27, aPTT 66,Subtherapeutic and correlating 4/15@2120 : HL 0.56, therapeutic x 1 4/16 0533 HL 0.40, therapeutic x 2 4/17 0345 HL 0.32, therapeutic x 3 4/18 0422 HL <0.1, subtherapeutic  Plan: Bolus 3900 units x 1 Increase heparin  infusion rate to 2150 units/hour. Recheck HL in 6 hrs after rate change Continue to monitor CBC daily while on heparin  infusion.  Coretta Dexter, PharmD, [facility] 12/16/2023 5:46 AM

## 2023-12-16 NOTE — Discharge Summary (Signed)
 Physician Discharge Summary   Patient: [name] MRN: [id number]  DOB: 07-Apr-1971   Admit:     Date of Admission: 12/12/2023 Admitted from: home   Discharge: Date of discharge: 12/16/23 Disposition: Home Condition at discharge: good  CODE STATUS: FULL CODE     Discharge Physician: Laneta Blunt, DO [facility]     PCP: Jeffie Cheryl BRAVO, MD  Recommendations for Outpatient Follow-up:  Follow up with PCP Jeffie Cheryl BRAVO, MD in 1-2 weeks for hospital follow up  Follow w/ vascular surgery as directed -call for appointment   Discharge Instructions     Diet - low sodium heart healthy   Complete by: As directed    Increase activity slowly   Complete by: As directed    Leave dressing on - Keep it clean, dry, and intact until clinic visit   Complete by: As directed          Discharge Diagnoses: Principal Problem:   AV fistula occlusion (HCC) Active Problems:   ESRD (end stage renal disease) (HCC)   HTN (hypertension)   Gout   HLD (hyperlipidemia)   COPD (chronic obstructive pulmonary disease) (HCC)   PVD (peripheral vascular disease) (HCC)   Obesity, Class III, BMI 40-49.9 (morbid obesity) (HCC)   AV fistula occlusion, initial encounter [facility])   Dialysis AV fistula malfunction [facility])     Hospital course / significant events:   HPI: This is a 53 y.o. male with medical history significant of  ESRD-HD (TTS), HTN. HLD, DM, COPD, PVD on Eliquis , gout, endocarditis, lymphedema, obesity, who presents with AV fistula occlusion in left arm. Pt states that he had dialysis on Tuesday 04/08. He went to dialysis on Thursday 04/10, and was told that his fistula was clotted and they were unable to use HD.  He did not try to do dialysis on Saturday 04/12.    04/14: admitted to hospitalist service evening  04/15: temp cath placed. HD today, 2 L off, had to be cut short d/t GI issues 04/16: continue w/ HD today, 3.5 L off. Fistulagram planned for tomorrow.   04/17: AV Fistulagram today - left upper arm AV fistula was unable to be revascularized, will be scheduled outpatient as follow-up for reconstruction of his left arm AV graft in the OR. Vascular did place R internal jugular permcath in the meantime  04/18: permcath functional for dialysis, pt ok for dc home, confirmed needs to be on eliquis  + plavix , compliance stressed      Consultants:  Nephrology Vascular surgery   Procedures/Surgeries: 04/15: placement temporary HD catheter R femoral vein - Dr Marea 04/17: vascular procedures:  Fistulagram and attempted thrombectomy L upper arm AV graft  R internal jugular tunneled permcath placement (given thrombosed unsalvageable L arm AVG)      ASSESSMENT & PLAN:   AV fistula occlusion: not salvageable R internal jugular permcath placed for dialysis access  Vascular following outpatient, will need to go to OR for reconstruction of AV graft  Resume eliquis  on discharge    Hypertension:   resume home meds   End stage renal disease on HD (TTS):  Nephrology assistance appreciated  Follow w/ dialysis  Type 1 diabetes mellitus with renal complication and with hyperglycemia Recent A1c 8.8, poorly controlled Resume home meds Follow w/ endocrinology outpatient    Gout Allopurinol    HLD (hyperlipidemia) Lipitor   COPD (chronic obstructive pulmonary disease): Stable Bronchodilators as needed   PVD (peripheral vascular disease)  Continue Lipitor Resume Eliquis  +  Plavix  on discharge     Super Morbid Obesity based on BMI 50 Underweight - under 18  overweight - 25 to 29 obese - 30 or more Class 1 obesity: BMI of 30.0 to 34 Class 2 obesity: BMI of 35.0 to 39 Class 3 obesity: BMI of 40.0 to 49 Super Morbid Obesity: BMI 50-59 Super-super Morbid Obesity: BMI 60+ Significantly low or high BMI is associated with higher medical risk.  Weight management advised as adjunct to other disease management and risk reduction  treatments           Discharge Instructions  Allergies as of 12/16/2023       Reactions   Lisinopril Swelling   Lips mouth    Furosemide  Cough   Light headed, blurry vision, weakness.   Latex Itching   Silicone Itching   Adhesive [tape] Itching, Rash        Medication List     TAKE these medications    Accu-Chek Guide test strip Generic drug: glucose blood USE 1 STRIP TOTAL 2 TIMES DAILY FOR 90 DAYS   acetaminophen  325 MG tablet Commonly known as: TYLENOL  Take 2 tablets (650 mg total) by mouth every 6 (six) hours as needed for mild pain (or Fever >/= 101).   allopurinol  100 MG tablet Commonly known as: ZYLOPRIM  Take 100 mg by mouth daily.   amLODipine  10 MG tablet Commonly known as: NORVASC  Take 5 mg by mouth daily as needed (if bp is 190/90 or higher).   apixaban  5 MG Tabs tablet Commonly known as: ELIQUIS  Take 1 tablet (5 mg total) by mouth 2 (two) times daily.   atorvastatin  80 MG tablet Commonly known as: LIPITOR Take 80 mg by mouth at bedtime.   calcium  acetate 667 MG capsule Commonly known as: PHOSLO  Take 1,334-2,001 mg by mouth See admin instructions. Take 2001 mg by mouth with each meal & take 1334 mg by mouth with each snack.   cinacalcet  30 MG tablet Commonly known as: SENSIPAR  Take 30 mg by mouth daily.   CINNAMON  PO Take 500 mg by mouth 2 (two) times daily.   clopidogrel  75 MG tablet Commonly known as: PLAVIX  Take 1 tablet (75 mg total) by mouth daily.   Dexcom G7 Sensor Misc USE 1 EACH EVERY 10 (TEN) DAYS   EYE HEALTH PO Take 1 capsule by mouth daily.   Insulin  Aspart FlexPen 100 UNIT/ML Commonly known as: NOVOLOG  Inject 5 Units subcutaneously 3 (three) times daily with meals SKIP dose if your sugar before your meal is your sugar is below 100.   insulin  degludec 100 UNIT/ML FlexTouch Pen Commonly known as: TRESIBA Inject 30 Units subcutaneously once daily   lidocaine -prilocaine  cream Commonly known as: EMLA  Apply 1  application topically as needed (port access).   multivitamin with minerals Tabs tablet Take 1 tablet by mouth 3 (three) times a week. Men's Multivitamin   Rena-Vite Rx 1 MG Tabs Take 1 tablet by mouth 3 (three) times a week.   SYSTANE OP Place 1 drop into both eyes daily as needed (dry eyes).               Discharge Care Instructions  (From admission, onward)           Start     Ordered   12/16/23 0000  Leave dressing on - Keep it clean, dry, and intact until clinic visit        12/16/23 1153  Follow-up Information     Dew, Selinda RAMAN, MD Follow up.   Specialties: Vascular Surgery, Radiology, Interventional Cardiology Contact information: [address] [place] [address] [phone]                 Allergies  Allergen Reactions   Lisinopril Swelling    Lips mouth    Furosemide  Cough    Light headed, blurry vision, weakness.   Latex Itching   Silicone Itching   Adhesive [Tape] Itching and Rash     Subjective: pt has no concerns this morning, no CP/SOB, sore at vascular surgery site and cath site but no bleeding, tolerating diet, ambulating normally   Discharge Exam: BP (!) 155/99 (BP Location: Right Wrist)   Pulse 73   Temp 98 F (36.7 C) (Oral)   Resp 18   Wt (!) 182.6 kg   SpO2 99%   BMI 49.00 kg/m  General: Pt is alert, awake, not in acute distress Cardiovascular: RRR, S1/S2 +, no rubs, no gallops Respiratory: CTA bilaterally, no wheezing, no rhonchi Abdominal: Soft, NT, ND, bowel sounds + Extremities: no edema proximal to leg wraps / hose      The results of significant diagnostics from this hospitalization (including imaging, microbiology, ancillary and laboratory) are listed below for reference.     Microbiology: No results found for this or any previous visit (from the past 240 hours).   Labs: BNP (last 3 results) No results for input(s): BNP in the last 8760 hours. Basic Metabolic  Panel: Recent Labs  Lab 12/12/23 1518 12/13/23 0432 12/14/23 0532 12/14/23 0533 12/15/23 0345  NA 141 143  --  139 135  K 5.0 5.2*  --  4.7 4.4  CL 104 106  --  104 99  CO2 20* 22  --  21* 22  GLUCOSE 116* 183*  --  73 225*  BUN 115* 124*  --  102* 77*  CREATININE 21.60* 22.51*  --  20.02* 15.98*  CALCIUM  9.2 8.7*  --  8.5* 8.6*  PHOS  --   --  7.7*  --   --    Liver Function Tests: Recent Labs  Lab 12/12/23 1518  AST 9*  ALT 13  ALKPHOS 51  BILITOT 1.0  PROT 7.5  ALBUMIN 3.9   No results for input(s): LIPASE, AMYLASE in the last 168 hours. No results for input(s): AMMONIA in the last 168 hours. CBC: Recent Labs  Lab 12/12/23 1518 12/13/23 0432 12/14/23 0533 12/15/23 0345 12/16/23 0422  WBC 6.3 4.8 4.4 3.9* 5.8  NEUTROABS 4.4  --   --   --   --   HGB 13.8 12.3* 12.4* 12.4* 13.3  HCT 42.2 37.8* 37.3* 38.0* 39.7  MCV 101.0* 102.4* 99.7 100.8* 97.8  PLT 147* 112* 116* 119* 102*   Cardiac Enzymes: No results for input(s): CKTOTAL, CKMB, CKMBINDEX, TROPONINI in the last 168 hours. BNP: Invalid input(s): POCBNP CBG: Recent Labs  Lab 12/15/23 1456 12/15/23 1958 12/15/23 2101 12/16/23 0906 12/16/23 1151  GLUCAP 74 106* 108* 135* 142*   D-Dimer No results for input(s): DDIMER in the last 72 hours. Hgb A1c No results for input(s): HGBA1C in the last 72 hours. Lipid Profile No results for input(s): CHOL, HDL, LDLCALC, TRIG, CHOLHDL, LDLDIRECT in the last 72 hours. Thyroid  function studies No results for input(s): TSH, T4TOTAL, T3FREE, THYROIDAB in the last 72 hours.  Invalid input(s): FREET3 Anemia work up No results for input(s): VITAMINB12, FOLATE, FERRITIN, TIBC, IRON , RETICCTPCT  in the last 72 hours. Urinalysis    Component Value Date/Time   COLORURINE YELLOW (A) 10/03/2017 1120   APPEARANCEUR CLOUDY (A) 10/03/2017 1120   LABSPEC 1.015 10/03/2017 1120   PHURINE 5.0 10/03/2017 1120   GLUCOSEU  NEGATIVE 10/03/2017 1120   HGBUR SMALL (A) 10/03/2017 1120   BILIRUBINUR NEGATIVE 10/03/2017 1120   KETONESUR NEGATIVE 10/03/2017 1120   PROTEINUR 30 (A) 10/03/2017 1120   NITRITE NEGATIVE 10/03/2017 1120   LEUKOCYTESUR NEGATIVE 10/03/2017 1120   Sepsis Labs Recent Labs  Lab 12/13/23 0432 12/14/23 0533 12/15/23 0345 12/16/23 0422  WBC 4.8 4.4 3.9* 5.8   Microbiology No results found for this or any previous visit (from the past 240 hours). Imaging PERIPHERAL VASCULAR CATHETERIZATION Result Date: 12/13/2023 See surgical note for result.     Time coordinating discharge: over 30 minutes  SIGNED:  Romonia Yanik DO [facility]

## 2023-12-16 NOTE — Progress Notes (Signed)
 Progress Note    12/16/2023 7:12 AM 1 Day Post-Op  Subjective:   [name] is a 53 year old male who is now status postop day #1 from perma cath dialysis catheter access placement.  Patient is resting comfortably sitting at the bedside today.  Denies any pain to the catheter insertion site.  Denies any difficulties ambulating.  Patient does endorse soreness to his left upper extremity AV graft site.  No other complaints overnight.  Vitals all remained stable.   Review of Systems  Constitutional: Negative.   Eyes: Negative.   Respiratory: Negative.    Cardiovascular: Negative.   Gastrointestinal: Negative.   Genitourinary: Negative.   Neurological: Negative.   Vitals:   12/15/23 2000 12/16/23 0437  BP: (!) 149/78 (!) 156/94  Pulse: 65 70  Resp: (!) 22 20  Temp: 97.8 F (36.6 C) 98.4 F (36.9 C)  SpO2: 99% 96%   Physical Exam: Cardiac:  RRR, normal S1 and S2.  No rubs clicks or gallops or murmurs noted. Lungs: Clear on auscultation except for small amount of rales in the bases bilaterally.  No rhonchi or wheezing noted. Incisions: Right groin incision with temporary dialysis access placement.  Dressings clean dry and intact.  No hematoma seroma to note. Extremities:  Unable to palpate bilateral lower extremity pulses due to morbid obesity and +4 edema due to lymphedema. Left upper extremity with AV fistula no bruit or thrill felt or heard.  Abdomen: Morbidly obese, positive bowel sounds throughout, soft, nondistended nontender. Neurologic: And oriented, answers all questions and follows commands appropriately.    CBC    Component Value Date/Time   WBC 5.8 12/16/2023 0422   RBC 4.06 (L) 12/16/2023 0422   HGB 13.3 12/16/2023 0422   HCT 39.7 12/16/2023 0422   PLT 102 (L) 12/16/2023 0422   MCV 97.8 12/16/2023 0422   MCH 32.8 12/16/2023 0422   MCHC 33.5 12/16/2023 0422   RDW 13.9 12/16/2023 0422   LYMPHSABS 1.2 12/12/2023 1518   MONOABS 0.5 12/12/2023 1518   EOSABS 0.1  12/12/2023 1518   BASOSABS 0.0 12/12/2023 1518    BMET    Component Value Date/Time   NA 135 12/15/2023 0345   K 4.4 12/15/2023 0345   CL 99 12/15/2023 0345   CO2 22 12/15/2023 0345   GLUCOSE 225 (H) 12/15/2023 0345   BUN 77 (H) 12/15/2023 0345   CREATININE 15.98 (H) 12/15/2023 0345   CALCIUM  8.6 (L) 12/15/2023 0345   GFRNONAA 3 (L) 12/15/2023 0345   GFRAA 9 (L) 02/19/2020 1316    INR    Component Value Date/Time   INR 1.2 12/12/2023 2355     Intake/Output Summary (Last 24 hours) at 12/16/2023 0981 Last data filed at 12/15/2023 2000 Gross per 24 hour  Intake 823.4 ml  Output 3000 ml  Net -2176.6 ml     Assessment/Plan:  53 y.o. male is s/p Perma Catheter dialysis access placement.  1 Day Post-Op   PLAN Patient is postop day 1 from Perma-catheter dialysis access placement without complications.  Hemodialysis access is working well.  Patient reports being dialyzed later today. Patient's left upper arm AV fistula was unable to be revascularized.  Patient will be scheduled outpatient as follow-up for reconstruction of his left arm AV graft.  This will require a visit to the operating room and a surgical procedure. Pain medication as needed Continue antihyperlipidemia medication Continue medications for good blood glucose control. Ambulate 3 times a day. Heart healthy renal diet. Hemodialysis per nephrology.  DVT prophylaxis: Heparin  with dialysis   Annamaria Barrette [facility] 12/16/2023 7:12 AM

## 2023-12-16 NOTE — Progress Notes (Addendum)
 [facility]  ROUNDING NOTE   Subjective:   [name] is known to our practice from outpatient dialysis.  He dialyzes at [facility].   Patient presents to the emergency department for evaluation of a clotted dialysis access.    Patient seen sitting at side of bed Breakfast tray at bedside Patient appears withdrawn Remains on room air, denies any shortness of breath States they placed a PermCath yesterday during vascular procedure and will develop a plan to further evaluate graft in the near future.  Objective:  Vital signs in last 24 hours:  Temp:  [97.8 F (36.6 C)-98.4 F (36.9 C)] 98 F (36.7 C) (04/18 0904) Pulse Rate:  [0-73] 73 (04/18 0904) Resp:  [12-22] 18 (04/18 0904) BP: (122-188)/(52-139) 155/99 (04/18 0904) SpO2:  [94 %-100 %] 99 % (04/18 0904) Weight:  [182.6 kg-185.9 kg] 182.6 kg (04/17 2000)  Weight change: 0.2 kg Filed Weights   12/14/23 1700 12/15/23 1532 12/15/23 2000  Weight: (!) 182.2 kg (!) 185.9 kg (!) 182.6 kg    Intake/Output: I/O last 3 completed shifts: In: 823.4 [I.V.:773.4; IV Piggyback:50] Out: 3000 [Other:3000]   Intake/Output this shift:  No intake/output data recorded.  Physical Exam: General: NAD  Head: Normocephalic, atraumatic. Moist oral mucosal membranes  Eyes: Anicteric  Lungs:  Clear to auscultation  Heart: Regular rate and rhythm  Abdomen:  Soft, nontender,   Extremities: +++ Peripheral edema. Compression hose  Neurologic: Alert, moving all four extremities  Skin: No lesions  Access: Left aVF (-bruit or thrill) right femoral HD temp cath placed on 4/15, right chest tunneled catheter placed on 4/17.    Basic Metabolic Panel: Recent Labs  Lab 12/12/23 1518 12/13/23 0432 12/14/23 0532 12/14/23 0533 12/15/23 0345  NA 141 143  --  139 135  K 5.0 5.2*  --  4.7 4.4  CL 104 106  --  104 99  CO2 20* 22  --  21* 22  GLUCOSE 116* 183*  --  73 225*  BUN 115* 124*  --  102* 77*  CREATININE 21.60* 22.51*   --  20.02* 15.98*  CALCIUM  9.2 8.7*  --  8.5* 8.6*  PHOS  --   --  7.7*  --   --     Liver Function Tests: Recent Labs  Lab 12/12/23 1518  AST 9*  ALT 13  ALKPHOS 51  BILITOT 1.0  PROT 7.5  ALBUMIN 3.9   No results for input(s): "LIPASE", "AMYLASE" in the last 168 hours. No results for input(s): "AMMONIA" in the last 168 hours.  CBC: Recent Labs  Lab 12/12/23 1518 12/13/23 0432 12/14/23 0533 12/15/23 0345 12/16/23 0422  WBC 6.3 4.8 4.4 3.9* 5.8  NEUTROABS 4.4  --   --   --   --   HGB 13.8 12.3* 12.4* 12.4* 13.3  HCT 42.2 37.8* 37.3* 38.0* 39.7  MCV 101.0* 102.4* 99.7 100.8* 97.8  PLT 147* 112* 116* 119* 102*    Cardiac Enzymes: No results for input(s): "CKTOTAL", "CKMB", "CKMBINDEX", "TROPONINI" in the last 168 hours.  BNP: Invalid input(s): "POCBNP"  CBG: Recent Labs  Lab 12/15/23 1224 12/15/23 1456 12/15/23 1958 12/15/23 2101 12/16/23 0906  GLUCAP 88 74 106* 108* 135*    Microbiology: Results for orders placed or performed during the hospital encounter of 03/24/23  Culture, blood (Routine x 2)     Status: Abnormal   Collection Time: 03/24/23  9:41 AM   Specimen: BLOOD  Result Value Ref Range Status  Specimen Description   Final    BLOOD BLOOD RIGHT ARM Performed at [facility], [address] [place], [place] [address]    Special Requests   Final    BOTTLES DRAWN AEROBIC AND ANAEROBIC Blood Culture results may not be optimal due to an excessive volume of blood received in culture bottles Performed at [facility], [address] [place], [place] [address]    Culture  Setup Time   Final    Organism ID to follow GRAM POSITIVE COCCI IN BOTH AEROBIC AND ANAEROBIC BOTTLES CRITICAL RESULT CALLED TO, READ BACK BY AND VERIFIED WITH: NATHAN BELEU AT 2141 ON 03/24/23 BY SS Performed at [facility], [address] [place], [place] [address]    Culture STREPTOCOCCUS GROUP G (A)  Final   Report Status 03/27/2023 FINAL   Final   Organism ID, Bacteria STREPTOCOCCUS GROUP G  Final      Susceptibility   Streptococcus group g - MIC*    CLINDAMYCIN RESISTANT Resistant     AMPICILLIN  <=0.25 SENSITIVE Sensitive     ERYTHROMYCIN >=8 RESISTANT Resistant     VANCOMYCIN  0.5 SENSITIVE Sensitive     CEFTRIAXONE  <=0.12 SENSITIVE Sensitive     LEVOFLOXACIN 0.5 SENSITIVE Sensitive     PENICILLIN <=0.06 SENSITIVE Sensitive     * STREPTOCOCCUS GROUP G  Blood Culture ID Panel (Reflexed)     Status: Abnormal   Collection Time: 03/24/23  9:41 AM  Result Value Ref Range Status   Enterococcus faecalis NOT DETECTED NOT DETECTED Final   Enterococcus Faecium NOT DETECTED NOT DETECTED Final   Listeria monocytogenes NOT DETECTED NOT DETECTED Final   Staphylococcus species NOT DETECTED NOT DETECTED Final   Staphylococcus aureus (BCID) NOT DETECTED NOT DETECTED Final   Staphylococcus epidermidis NOT DETECTED NOT DETECTED Final   Staphylococcus lugdunensis NOT DETECTED NOT DETECTED Final   Streptococcus species DETECTED (A) NOT DETECTED Final    Comment: Not Enterococcus species, Streptococcus agalactiae, Streptococcus pyogenes, or Streptococcus pneumoniae. CRITICAL RESULT CALLED TO, READ BACK BY AND VERIFIED WITH: NATHAN BELUE AT 2141 ON 03/24/23 BY SS    Streptococcus agalactiae NOT DETECTED NOT DETECTED Final   Streptococcus pneumoniae NOT DETECTED NOT DETECTED Final   Streptococcus pyogenes NOT DETECTED NOT DETECTED Final   A.calcoaceticus-baumannii NOT DETECTED NOT DETECTED Final   Bacteroides fragilis NOT DETECTED NOT DETECTED Final   Enterobacterales NOT DETECTED NOT DETECTED Final   Enterobacter cloacae complex NOT DETECTED NOT DETECTED Final   Escherichia coli NOT DETECTED NOT DETECTED Final   Klebsiella aerogenes NOT DETECTED NOT DETECTED Final   Klebsiella oxytoca NOT DETECTED NOT DETECTED Final   Klebsiella pneumoniae NOT DETECTED NOT DETECTED Final   Proteus species NOT DETECTED NOT DETECTED Final   Salmonella  species NOT DETECTED NOT DETECTED Final   Serratia marcescens NOT DETECTED NOT DETECTED Final   Haemophilus influenzae NOT DETECTED NOT DETECTED Final   Neisseria meningitidis NOT DETECTED NOT DETECTED Final   Pseudomonas aeruginosa NOT DETECTED NOT DETECTED Final   Stenotrophomonas maltophilia NOT DETECTED NOT DETECTED Final   Candida albicans NOT DETECTED NOT DETECTED Final   Candida auris NOT DETECTED NOT DETECTED Final   Candida glabrata NOT DETECTED NOT DETECTED Final   Candida krusei NOT DETECTED NOT DETECTED Final   Candida parapsilosis NOT DETECTED NOT DETECTED Final   Candida tropicalis NOT DETECTED NOT DETECTED Final   Cryptococcus neoformans/gattii NOT DETECTED NOT DETECTED Final    Comment: Performed at [facility], [address]  [place], [place] [address]  Culture, blood (Routine x 2)     Status: Abnormal   Collection Time: 03/24/23  9:55 AM   Specimen: BLOOD RIGHT ARM  Result Value Ref Range Status   Specimen Description   Final    BLOOD RIGHT ARM Performed at [facility], [address], [place], [place] [address]    Special Requests   Final    BOTTLES DRAWN AEROBIC AND ANAEROBIC Blood Culture adequate volume Performed at [facility], [address] [place], [place] [address]    Culture  Setup Time   Final    GRAM POSITIVE COCCI IN BOTH AEROBIC AND ANAEROBIC BOTTLES CRITICAL VALUE NOTED.  VALUE IS CONSISTENT WITH PREVIOUSLY REPORTED AND CALLED VALUE. Performed at [facility], [address] [place], [place] [address]    Culture (A)  Final    STREPTOCOCCUS GROUP G SUSCEPTIBILITIES PERFORMED ON PREVIOUS CULTURE WITHIN THE LAST 5 DAYS. Performed at [facility], [address] [place], [place] [address]    Report Status 03/27/2023 FINAL  Final  Resp panel by RT-PCR (RSV, Flu A&B, Covid) Anterior Nasal Swab     Status: None   Collection Time: 03/24/23  9:55 AM   Specimen: Anterior Nasal Swab  Result Value Ref  Range Status   SARS Coronavirus 2 by RT PCR NEGATIVE NEGATIVE Final    Comment: (NOTE) SARS-CoV-2 target nucleic acids are NOT DETECTED.  The SARS-CoV-2 RNA is generally detectable in upper respiratory specimens during the acute phase of infection. The lowest concentration of SARS-CoV-2 viral copies this assay can detect is 138 copies/mL. A negative result does not preclude SARS-Cov-2 infection and should not be used as the sole basis for treatment or other patient management decisions. A negative result may occur with  improper specimen collection/handling, submission of specimen other than nasopharyngeal swab, presence of viral mutation(s) within the areas targeted by this assay, and inadequate number of viral copies(<138 copies/mL). A negative result must be combined with clinical observations, patient history, and epidemiological information. The expected result is Negative.  Fact Sheet for Patients:  BloggerCourse.com  Fact Sheet for Healthcare Providers:  SeriousBroker.it  This test is no t yet approved or cleared by the [place]  FDA and  has been authorized for detection and/or diagnosis of SARS-CoV-2 by FDA under an Emergency Use Authorization (EUA). This EUA will remain  in effect (meaning this test can be used) for the duration of the COVID-19 declaration under Section 564(b)(1) of the Act, 21 U.S.C.section 360bbb-3(b)(1), unless the authorization is terminated  or revoked sooner.       Influenza A by PCR NEGATIVE NEGATIVE Final   Influenza B by PCR NEGATIVE NEGATIVE Final    Comment: (NOTE) The Xpert Xpress SARS-CoV-2/FLU/RSV plus assay is intended as an aid in the diagnosis of influenza from Nasopharyngeal swab specimens and should not be used as a sole basis for treatment. Nasal washings and aspirates are unacceptable for Xpert Xpress SARS-CoV-2/FLU/RSV testing.  Fact Sheet for  Patients: BloggerCourse.com  Fact Sheet for Healthcare Providers: SeriousBroker.it  This test is not yet approved or cleared by the [place]  FDA and has been authorized for detection and/or diagnosis of SARS-CoV-2 by FDA under an Emergency Use Authorization (EUA). This EUA will remain in effect (meaning this test can be used) for the duration of the COVID-19 declaration under Section 564(b)(1) of the Act, 21 U.S.C. section 360bbb-3(b)(1), unless the authorization is terminated or revoked.     Resp Syncytial Virus  by PCR NEGATIVE NEGATIVE Final    Comment: (NOTE) Fact Sheet for Patients: BloggerCourse.com  Fact Sheet for Healthcare Providers: SeriousBroker.it  This test is not yet approved or cleared by the [place]  FDA and has been authorized for detection and/or diagnosis of SARS-CoV-2 by FDA under an Emergency Use Authorization (EUA). This EUA will remain in effect (meaning this test can be used) for the duration of the COVID-19 declaration under Section 564(b)(1) of the Act, 21 U.S.C. section 360bbb-3(b)(1), unless the authorization is terminated or revoked.  Performed at [facility], [address] [place], [place] [address]   Aerobic Culture w Gram Stain (superficial specimen)     Status: None   Collection Time: 03/25/23  6:50 PM   Specimen: Wound  Result Value Ref Range Status   Specimen Description   Final    WOUND Performed at [facility], [address] [place], [place] [address]    Special Requests   Final    NONE Performed at [facility], [address] [place], [place] [address]    Gram Stain   Final    FEW WBC PRESENT, PREDOMINANTLY PMN RARE GRAM POSITIVE RODS RARE GRAM POSITIVE COCCI    Culture   Final    FEW ESCHERICHIA COLI MODERATE CORYNEBACTERIUM SPECIES Standardized susceptibility testing for this  organism is not available. Performed at [facility], [address], [place], [place] [address]    Report Status 03/29/2023 FINAL  Final   Organism ID, Bacteria ESCHERICHIA COLI  Final      Susceptibility   Escherichia coli - MIC*    AMPICILLIN  4 SENSITIVE Sensitive     CEFEPIME  <=0.12 SENSITIVE Sensitive     CEFTAZIDIME <=1 SENSITIVE Sensitive     CEFTRIAXONE  <=0.25 SENSITIVE Sensitive     CIPROFLOXACIN  0.5 INTERMEDIATE Intermediate     GENTAMICIN <=1 SENSITIVE Sensitive     IMIPENEM <=0.25 SENSITIVE Sensitive     TRIMETH/SULFA >=320 RESISTANT Resistant     AMPICILLIN /SULBACTAM <=2 SENSITIVE Sensitive     PIP/TAZO <=4 SENSITIVE Sensitive     * FEW ESCHERICHIA COLI  Culture, blood (Routine X 2) w Reflex to ID Panel     Status: None   Collection Time: 03/26/23  5:06 AM   Specimen: BLOOD  Result Value Ref Range Status   Specimen Description BLOOD BLOOD RIGHT ARM  Final   Special Requests   Final    BOTTLES DRAWN AEROBIC AND ANAEROBIC Blood Culture adequate volume   Culture   Final    NO GROWTH 5 DAYS Performed at [facility], [address], [place], [place] [address]    Report Status 03/31/2023 FINAL  Final  Culture, blood (Routine X 2) w Reflex to ID Panel     Status: None   Collection Time: 03/26/23  5:06 AM   Specimen: BLOOD  Result Value Ref Range Status   Specimen Description BLOOD BLOOD RIGHT HAND  Final   Special Requests   Final    BOTTLES DRAWN AEROBIC ONLY Blood Culture adequate volume   Culture   Final    NO GROWTH 5 DAYS Performed at [facility], [address] [place], [place] [address]    Report Status 03/31/2023 FINAL  Final    Coagulation Studies: No results for input(s): "LABPROT", "INR" in the last 72 hours.   Urinalysis: No results for input(s): "COLORURINE", "LABSPEC", "PHURINE", "GLUCOSEU", "HGBUR", "BILIRUBINUR", "KETONESUR", "PROTEINUR", "UROBILINOGEN", "NITRITE", "LEUKOCYTESUR" in the last 72 hours.  Invalid  input(s): "APPERANCEUR"    Imaging: PERIPHERAL VASCULAR CATHETERIZATION Result Date: 12/15/2023 See surgical note for result.    Medications:      allopurinol   100 mg Oral Daily   apixaban   5 mg Oral BID   atorvastatin   80 mg Oral QHS   calcium  acetate  2,001 mg Oral TID WC   Chlorhexidine  Gluconate Cloth  6 each Topical Q0600   cinacalcet   30 mg Oral Q breakfast   insulin  aspart  0-5 Units Subcutaneous QHS   insulin  aspart  0-9 Units Subcutaneous TID WC   insulin  glargine-yfgn  20 Units Subcutaneous Daily   multivitamin  1 tablet Oral Daily   multivitamin  1 tablet Oral Once per day on Monday Wednesday Friday   multivitamin with minerals  1 tablet Oral Once per day on Monday Wednesday Friday   acetaminophen , albuterol , alteplase , calcium  acetate **AND** calcium  acetate, dextromethorphan-guaiFENesin , heparin , heparin , hydrALAZINE , lidocaine -EPINEPHrine  (PF), ondansetron  (ZOFRAN ) IV  Assessment/ Plan:  [name] is a 53 y.o.  male  with  ESRD-HD (TTS), HTN. HJLD, DM, COPD, PVD on Eliquis , gout, obesity, chronic ulcer in left leg and foot.   [facility]/TTS/left aVF  Malfunctioning dialysis access, left aVF without bruit or thrill.  Appreciate vascular surgery placing HD temp catheter 4/15.  Vascular unable to repair graft at this time, tunneled catheter placed.  Vascular will develop a plan for patient to follow-up outpatient for further evaluation.  2.  End-stage renal disease on hemodialysis.  Patient received dialysis after procedure yesterday, UF 3 L achieved.  Tunneled catheter used for that treatment, functioning well.  Next treatment scheduled for Saturday.  Order placed to remove HD temp cath.  Patient cleared to discharge from renal stance and continue treatment at assigned outpatient clinic.  3. Anemia of chronic kidney disease Lab Results  Component Value Date   HGB 13.3 12/16/2023    Hemoglobin remains at goal.  Will continue to monitor.  4.  Secondary Hyperparathyroidism: with outpatient labs: Unavailable  Lab Results  Component Value Date   CALCIUM  8.6 (L) 12/15/2023   CAION 1.14 (L) 11/12/2022   PHOS 7.7 (H) 12/14/2023    Continue Cinacalcet  and calcium  acetate as ordered.  Calcium  acceptable.  Phosphorus 7.7, continue calcium  acetate with meals.    LOS: 3 [name] 4/18/202511:44 AM

## 2023-12-16 NOTE — Progress Notes (Signed)
 Patient discharge paperwork given to patient. All questions answered and medications delivered to patients room per pharmacy.

## 2023-12-16 NOTE — Care Management Important Message (Signed)
 Important Message  Patient Details  Name: NIVAN MELENDREZ MRN: [id number] Date of Birth: [birth year]   Important Message Given:  Yes - Medicare IM     Anise Kerns 12/16/2023, 11:39 AM

## 2023-12-19 ENCOUNTER — Other Ambulatory Visit: Payer: Self-pay

## 2023-12-22 ENCOUNTER — Other Ambulatory Visit: Payer: Self-pay

## 2023-12-23 ENCOUNTER — Other Ambulatory Visit: Payer: Self-pay

## 2023-12-29 DIAGNOSIS — Z992 Dependence on renal dialysis: Secondary | ICD-10-CM | POA: Diagnosis not present

## 2023-12-29 DIAGNOSIS — N186 End stage renal disease: Secondary | ICD-10-CM | POA: Diagnosis not present

## 2023-12-30 ENCOUNTER — Encounter: Payer: PRIVATE HEALTH INSURANCE | Admitting: *Deleted

## 2023-12-31 DIAGNOSIS — N186 End stage renal disease: Secondary | ICD-10-CM | POA: Diagnosis not present

## 2023-12-31 DIAGNOSIS — Z992 Dependence on renal dialysis: Secondary | ICD-10-CM | POA: Diagnosis not present

## 2024-01-02 DIAGNOSIS — I120 Hypertensive chronic kidney disease with stage 5 chronic kidney disease or end stage renal disease: Secondary | ICD-10-CM | POA: Diagnosis not present

## 2024-01-02 DIAGNOSIS — Z1331 Encounter for screening for depression: Secondary | ICD-10-CM | POA: Diagnosis not present

## 2024-01-02 DIAGNOSIS — N186 End stage renal disease: Secondary | ICD-10-CM | POA: Diagnosis not present

## 2024-01-02 DIAGNOSIS — E1151 Type 2 diabetes mellitus with diabetic peripheral angiopathy without gangrene: Secondary | ICD-10-CM | POA: Diagnosis not present

## 2024-01-02 DIAGNOSIS — E785 Hyperlipidemia, unspecified: Secondary | ICD-10-CM | POA: Diagnosis not present

## 2024-01-02 DIAGNOSIS — E221 Hyperprolactinemia: Secondary | ICD-10-CM | POA: Diagnosis not present

## 2024-01-02 DIAGNOSIS — Z599 Problem related to housing and economic circumstances, unspecified: Secondary | ICD-10-CM | POA: Diagnosis not present

## 2024-01-02 DIAGNOSIS — E1122 Type 2 diabetes mellitus with diabetic chronic kidney disease: Secondary | ICD-10-CM | POA: Diagnosis not present

## 2024-01-02 DIAGNOSIS — Z0000 Encounter for general adult medical examination without abnormal findings: Secondary | ICD-10-CM | POA: Diagnosis not present

## 2024-01-02 DIAGNOSIS — E113593 Type 2 diabetes mellitus with proliferative diabetic retinopathy without macular edema, bilateral: Secondary | ICD-10-CM | POA: Diagnosis not present

## 2024-01-03 ENCOUNTER — Encounter: Payer: PRIVATE HEALTH INSURANCE | Admitting: *Deleted

## 2024-01-03 DIAGNOSIS — Z79899 Other long term (current) drug therapy: Secondary | ICD-10-CM | POA: Diagnosis not present

## 2024-01-03 DIAGNOSIS — N186 End stage renal disease: Secondary | ICD-10-CM | POA: Diagnosis not present

## 2024-01-03 DIAGNOSIS — Z992 Dependence on renal dialysis: Secondary | ICD-10-CM | POA: Diagnosis not present

## 2024-01-03 DIAGNOSIS — E119 Type 2 diabetes mellitus without complications: Secondary | ICD-10-CM | POA: Diagnosis not present

## 2024-01-05 DIAGNOSIS — Z992 Dependence on renal dialysis: Secondary | ICD-10-CM | POA: Diagnosis not present

## 2024-01-05 DIAGNOSIS — N186 End stage renal disease: Secondary | ICD-10-CM | POA: Diagnosis not present

## 2024-01-06 ENCOUNTER — Encounter: Payer: Self-pay | Admitting: *Deleted

## 2024-01-06 ENCOUNTER — Other Ambulatory Visit (INDEPENDENT_AMBULATORY_CARE_PROVIDER_SITE_OTHER): Payer: Self-pay | Admitting: Vascular Surgery

## 2024-01-06 ENCOUNTER — Ambulatory Visit: Payer: PRIVATE HEALTH INSURANCE | Admitting: *Deleted

## 2024-01-06 DIAGNOSIS — N186 End stage renal disease: Secondary | ICD-10-CM

## 2024-01-06 NOTE — Patient Outreach (Signed)
 Error  Duplicate note for unsuccessful outreach   Shataria Crist L. Mcarthur Speedy, RN, BSN, CCM [place]  [facility], [facility] RN Care Manager Direct Dial : [phone]  Fax: [phone]

## 2024-01-07 DIAGNOSIS — N186 End stage renal disease: Secondary | ICD-10-CM | POA: Diagnosis not present

## 2024-01-07 DIAGNOSIS — Z992 Dependence on renal dialysis: Secondary | ICD-10-CM | POA: Diagnosis not present

## 2024-01-09 ENCOUNTER — Ambulatory Visit (INDEPENDENT_AMBULATORY_CARE_PROVIDER_SITE_OTHER)

## 2024-01-09 DIAGNOSIS — N186 End stage renal disease: Secondary | ICD-10-CM

## 2024-01-10 DIAGNOSIS — N186 End stage renal disease: Secondary | ICD-10-CM | POA: Diagnosis not present

## 2024-01-10 DIAGNOSIS — Z992 Dependence on renal dialysis: Secondary | ICD-10-CM | POA: Diagnosis not present

## 2024-01-12 ENCOUNTER — Telehealth (INDEPENDENT_AMBULATORY_CARE_PROVIDER_SITE_OTHER): Payer: Self-pay | Admitting: Vascular Surgery

## 2024-01-12 DIAGNOSIS — Z992 Dependence on renal dialysis: Secondary | ICD-10-CM | POA: Diagnosis not present

## 2024-01-12 DIAGNOSIS — N186 End stage renal disease: Secondary | ICD-10-CM | POA: Diagnosis not present

## 2024-01-12 NOTE — Telephone Encounter (Signed)
 [facility] received a call from Dr. Zelda Hickman inquiring about moving up fu appt with GS on 01/30/24. Called and LVM for pt TCB to move up appt per his availability.

## 2024-01-13 ENCOUNTER — Other Ambulatory Visit: Payer: Self-pay

## 2024-01-14 DIAGNOSIS — Z992 Dependence on renal dialysis: Secondary | ICD-10-CM | POA: Diagnosis not present

## 2024-01-14 DIAGNOSIS — N186 End stage renal disease: Secondary | ICD-10-CM | POA: Diagnosis not present

## 2024-01-17 DIAGNOSIS — N186 End stage renal disease: Secondary | ICD-10-CM | POA: Diagnosis not present

## 2024-01-17 DIAGNOSIS — Z992 Dependence on renal dialysis: Secondary | ICD-10-CM | POA: Diagnosis not present

## 2024-01-18 ENCOUNTER — Other Ambulatory Visit: Payer: Self-pay

## 2024-01-19 ENCOUNTER — Other Ambulatory Visit: Payer: Self-pay

## 2024-01-19 DIAGNOSIS — Z992 Dependence on renal dialysis: Secondary | ICD-10-CM | POA: Diagnosis not present

## 2024-01-19 DIAGNOSIS — N186 End stage renal disease: Secondary | ICD-10-CM | POA: Diagnosis not present

## 2024-01-21 DIAGNOSIS — Z992 Dependence on renal dialysis: Secondary | ICD-10-CM | POA: Diagnosis not present

## 2024-01-21 DIAGNOSIS — N186 End stage renal disease: Secondary | ICD-10-CM | POA: Diagnosis not present

## 2024-01-24 DIAGNOSIS — Z992 Dependence on renal dialysis: Secondary | ICD-10-CM | POA: Diagnosis not present

## 2024-01-24 DIAGNOSIS — N186 End stage renal disease: Secondary | ICD-10-CM | POA: Diagnosis not present

## 2024-01-26 DIAGNOSIS — N186 End stage renal disease: Secondary | ICD-10-CM | POA: Diagnosis not present

## 2024-01-26 DIAGNOSIS — Z992 Dependence on renal dialysis: Secondary | ICD-10-CM | POA: Diagnosis not present

## 2024-01-28 DIAGNOSIS — N186 End stage renal disease: Secondary | ICD-10-CM | POA: Diagnosis not present

## 2024-01-28 DIAGNOSIS — Z992 Dependence on renal dialysis: Secondary | ICD-10-CM | POA: Diagnosis not present

## 2024-01-28 NOTE — Progress Notes (Unsigned)
 MRN : [id number]  [name] is a 53 y.o. (10-07-1970) male who presents with chief complaint of check access.  History of Present Illness:    The patient is seen for evaluation of dialysis access.  The patient has a history of multiple failed accesses.  There have been accesses in both arms which are nonfunctioning.    Current access is via a catheter which is functioning poorly the flow rates have been less than ideal.  There have not been multiple episodes of catheter infection.  The patient denies fever and chills while on dialysis.  No tenderness or drainage at the exit site.  No recent shortening of the patient's walking distance or new symptoms consistent with claudication.  No history of rest pain symptoms. No new ulcers or wounds of the lower extremities have occurred.  The patient denies amaurosis fugax or recent TIA symptoms. There are no recent neurological changes noted. There is no history of DVT, PE or superficial thrombophlebitis. No recent episodes of angina or shortness of breath documented.   Vein mapping right arm demonstrates the cephalic vein is too small for fistula creation.  The basilic vein is marginal but becomes quite small at the antecubital fossa and therefore we would not be able to extend distally to gain length.  Arterial study right upper extremity demonstrates triphasic brachial triphasic ulnar and triphasic radial artery signals  No outpatient medications have been marked as taking for the 01/30/24 encounter (Appointment) with Prescilla Brod, Ninette Basque, MD.    Past Medical History:  Diagnosis Date   Anemia    Chronic kidney disease    14% FUNCTION   Diabetes mellitus without complication (HCC)    Diabetic retinopathy (HCC)    Dyspnea    DOE   Dysrhythmia    End stage renal disease (HCC)    History of orthopnea    error wrong patietn   Hypercholesteremia    error   Hyperkalemia    error wrong patient   Hypertension     Long-term insulin  use (HCC)    Lymphedema of leg    Metabolic encephalopathy    error   Microalbuminuria    MSSA (methicillin susceptible Staphylococcus aureus)    Obesity    PVD (peripheral vascular disease) (HCC)    Respiratory failure (HCC)    Error   Sepsis (HCC)    error   Sleep apnea    no CPAP   UTI (urinary tract infection)    error   Venous ulcer (HCC)    Vitamin D deficiency     Past Surgical History:  Procedure Laterality Date   A/V FISTULAGRAM Right 10/24/2018   Procedure: A/V FISTULAGRAM;  Surgeon: Jackquelyn Mass, MD;  Location: [facility];  Service: Cardiovascular;  Laterality: Right;   A/V FISTULAGRAM Right 11/24/2018   Procedure: A/V FISTULAGRAM;  Surgeon: Jackquelyn Mass, MD;  Location: [facility];  Service: Cardiovascular;  Laterality: Right;   A/V FISTULAGRAM Left 07/31/2019   Procedure: A/V FISTULAGRAM;  Surgeon: Jackquelyn Mass, MD;  Location: [facility];  Service: Cardiovascular;  Laterality: Left;   A/V FISTULAGRAM Left 12/11/2019   Procedure: A/V FISTULAGRAM;  Surgeon: Jackquelyn Mass, MD;  Location: [facility];  Service: Cardiovascular;  Laterality: Left;   A/V FISTULAGRAM Left 07/08/2023   Procedure: A/V Fistulagram;  Surgeon: Jackquelyn Mass,  MD;  Location: [facility];  Service: Cardiovascular;  Laterality: Left;   A/V FISTULAGRAM Left 09/16/2023   Procedure: A/V Fistulagram;  Surgeon: Jackquelyn Mass, MD;  Location: [facility];  Service: Cardiovascular;  Laterality: Left;   A/V FISTULAGRAM Left 12/15/2023   Procedure: A/V Fistulagram;  Surgeon: Celso College, MD;  Location: [facility];  Service: Cardiovascular;  Laterality: Left;   A/V SHUNT INTERVENTION Right 02/21/2019   Procedure: A/V SHUNT INTERVENTION;  Surgeon: Jackquelyn Mass, MD;  Location: [facility];  Service: Cardiovascular;  Laterality: Right;   APPLICATION OF WOUND VAC Left 10/03/2017   Procedure:  APPLICATION OF WOUND VAC;  Surgeon: Celso College, MD;  Location: [facility];  Service: General;  Laterality: Left;   APPLICATION OF WOUND VAC Left 10/11/2017   Procedure: APPLICATION OF WOUND VAC;  Surgeon: Jackquelyn Mass, MD;  Location: [facility];  Service: Vascular;  Laterality: Left;   APPLICATION OF WOUND VAC Left 10/14/2017   Procedure: WOUND VAC CHANGE;  Surgeon: Jackquelyn Mass, MD;  Location: [facility];  Service: Vascular;  Laterality: Left;  left lower leg   APPLICATION OF WOUND VAC Left 10/18/2017   Procedure: WOUND VAC CHANGE;  Surgeon: Jackquelyn Mass, MD;  Location: [facility];  Service: Vascular;  Laterality: Left;   APPLICATION OF WOUND VAC Left 10/07/2017   Procedure: APPLICATION OF WOUND VAC;  Surgeon: Jackquelyn Mass, MD;  Location: [facility];  Service: Vascular;  Laterality: Left;   APPLICATION OF WOUND VAC Left 11/17/2022   Procedure: Wound vac exchange;  Surgeon: Jackquelyn Mass, MD;  Location: [facility];  Service: Vascular;  Laterality: Left;   APPLICATION OF WOUND VAC Left 11/10/2022   Procedure: APPLICATION OF WOUND VAC;  Surgeon: Jackquelyn Mass, MD;  Location: [facility];  Service: Vascular;  Laterality: Left;   APPLICATION OF WOUND VAC Left 11/24/2022   Procedure: WOUND VAC REMOVAL;  Surgeon: Jackquelyn Mass, MD;  Location: [facility];  Service: Vascular;  Laterality: Left;   APPLICATION OF WOUND VAC Left 11/12/2022   Procedure: APPLICATION OF WOUND VAC;  Surgeon: Jackquelyn Mass, MD;  Location: [facility];  Service: Vascular;  Laterality: Left;   APPLICATION OF WOUND VAC Left 01/06/2023   Procedure: APPLICATION OF WOUND VAC;  Surgeon: Jackquelyn Mass, MD;  Location: [facility];  Service: Vascular;  Laterality: Left;   AV FISTULA INSERTION W/ RF MAGNETIC GUIDANCE Right 07/19/2018   Procedure: AV FISTULA INSERTION W/RF MAGNETIC GUIDANCE;  Surgeon: Jackquelyn Mass, MD;  Location: [facility];  Service: Cardiovascular;  Laterality: Right;   AV FISTULA  PLACEMENT Left 05/11/2019   Procedure: ARTERIOVENOUS (AV) FISTULA CREATION (RADIOCEPHALIC );  Surgeon: Jackquelyn Mass, MD;  Location: [facility];  Service: Vascular;  Laterality: Left;   AV FISTULA PLACEMENT Left 02/20/2020   Procedure: INSERTION OF ARTERIOVENOUS (AV) GORE-TEX GRAFT LEFT ARM ( FOREARM LOOP );  Surgeon: Jackquelyn Mass, MD;  Location: [facility];  Service: Vascular;  Laterality: Left;   CATARACT EXTRACTION W/PHACO Left 11/01/2018   Procedure: CATARACT EXTRACTION PHACO AND INTRAOCULAR LENS PLACEMENT (IOC)-LEFT, DIABETIC-INSULIN  DEPENDENT;  Surgeon: Rosa College, MD;  Location: [facility];  Service: Ophthalmology;  Laterality: Left;  US  00:41.8 CDE 4.61 Fluid Pack Lot # [id number]   CATARACT EXTRACTION W/PHACO Right 04/27/2019   Procedure: CATARACT EXTRACTION PHACO AND INTRAOCULAR LENS PLACEMENT (IOC);  Surgeon: Rosa College, MD;  Location: [facility];  Service: Ophthalmology;  Laterality: Right;  US  00:34 CDE 1.97 FLUID PACK LOT # [id number]    COLONOSCOPY WITH PROPOFOL  N/A 05/12/2020   Procedure: COLONOSCOPY WITH PROPOFOL ;  Surgeon: Toledo, Alphonsus Jeans, MD;  Location: [facility];  Service: Gastroenterology;  Laterality: N/A;   DIALYSIS/PERMA CATHETER INSERTION N/A 02/23/2019   Procedure: DIALYSIS/PERMA CATHETER INSERTION;  Surgeon: Celso College, MD;  Location: [facility];  Service: Cardiovascular;  Laterality: N/A;   DIALYSIS/PERMA CATHETER INSERTION Right 12/15/2023   Procedure: DIALYSIS/PERMA CATHETER INSERTION;  Surgeon: Celso College, MD;  Location: [facility];  Service: Cardiovascular;  Laterality: Right;   DIALYSIS/PERMA CATHETER REMOVAL N/A 04/03/2020   Procedure: DIALYSIS/PERMA CATHETER REMOVAL;  Surgeon: Jackquelyn Mass, MD;  Location: [facility];  Service: Cardiovascular;  Laterality: N/A;   DIALYSIS/PERMA CATHETER REMOVAL N/A 02/03/2022   Procedure: DIALYSIS/PERMA CATHETER REMOVAL;  Surgeon: Jackquelyn Mass, MD;  Location: [facility];  Service: Cardiovascular;  Laterality: N/A;   I & D EXTREMITY Left 10/11/2017   Procedure: IRRIGATION AND DEBRIDEMENT EXTREMITY;  Surgeon: Jackquelyn Mass, MD;  Location: [facility];  Service: Vascular;  Laterality: Left;   INCISION AND DRAINAGE ABSCESS Right 10/07/2017   Procedure: INCISION AND DRAINAGE ABSCESS;  Surgeon: Jackquelyn Mass, MD;  Location: [facility];  Service: Vascular;  Laterality: Right;   INCISION AND DRAINAGE OF WOUND Left 11/17/2022   Procedure: DEBRIDEMENT WOUND;  Surgeon: Jackquelyn Mass, MD;  Location: [facility];  Service: Vascular;  Laterality: Left;   INCISION AND DRAINAGE OF WOUND Left 11/24/2022   Procedure: DEBRIDEMENT WOUND LEFT LOWER EXTREMITY;  Surgeon: Jackquelyn Mass, MD;  Location: [facility];  Service: Vascular;  Laterality: Left;   IRRIGATION AND DEBRIDEMENT ABSCESS Left 10/03/2017   Procedure: IRRIGATION AND DEBRIDEMENT ABSCESS with debridement of skin, soft tissue, muscle 50sq cm;  Surgeon: Celso College, MD;  Location: [facility];  Service: General;  Laterality: Left;   LOWER EXTREMITY ANGIOGRAPHY Left 10/15/2022   Procedure: Lower Extremity Angiography;  Surgeon: Jackquelyn Mass, MD;  Location: [facility];  Service: Cardiovascular;  Laterality: Left;   TEE WITHOUT CARDIOVERSION N/A 03/29/2023   Procedure: TRANSESOPHAGEAL ECHOCARDIOGRAM;  Surgeon: Devorah Fonder, MD;  Location: [facility];  Service: Cardiovascular;  Laterality: N/A;   TEMPORARY DIALYSIS CATHETER  02/20/2019   Procedure: TEMPORARY DIALYSIS CATHETER;  Surgeon: Jackquelyn Mass, MD;  Location: [facility];  Service: Cardiovascular;;   TEMPORARY DIALYSIS CATHETER N/A 12/13/2023   Procedure: TEMPORARY DIALYSIS CATHETER;  Surgeon: Jackquelyn Mass, MD;  Location: [facility];  Service: Cardiovascular;  Laterality: N/A;   UPPER EXTREMITY ANGIOGRAPHY Left 09/18/2019   Procedure: UPPER EXTREMITY ANGIOGRAPHY;  Surgeon: Jackquelyn Mass, MD;  Location: [facility];  Service: Cardiovascular;  Laterality: Left;   WOUND DEBRIDEMENT Left 11/10/2022   Procedure: DEBRIDEMENT WOUND;  Surgeon: Jackquelyn Mass, MD;  Location: [facility];  Service: Vascular;  Laterality: Left;   WOUND DEBRIDEMENT Left 11/12/2022   Procedure: DEBRIDEMENT WOUND;  Surgeon: Jackquelyn Mass, MD;  Location: [facility];  Service: Vascular;  Laterality: Left;   WOUND DEBRIDEMENT Left 01/06/2023   Procedure: DEBRIDEMENT WOUND;  Surgeon: Jackquelyn Mass, MD;  Location: [facility];  Service: Vascular;  Laterality: Left;    Social History Social History   Tobacco Use   Smoking status: Former    Types: Cigars    Passive exposure: Past   Smokeless tobacco: Former    Quit date:  09/30/2017   Tobacco comments:    occasional  Vaping Use   Vaping status: Never Used  Substance Use Topics   Alcohol  use: Yes    Alcohol /week: 1.0 standard drink of alcohol     Types: 1 Cans of beer per week    Comment: beer   Drug use: Not Currently    Family History Family History  Problem Relation Age of Onset   Diabetes Father    Kidney disease Father    Diabetes Daughter     Allergies  Allergen Reactions   Lisinopril Swelling    Lips mouth    Furosemide  Cough    Light headed, blurry vision, weakness.   Latex Itching   Silicone Itching   Adhesive [Tape] Itching and Rash     REVIEW OF SYSTEMS (Negative unless checked)  Constitutional: [] Weight loss  [] Fever  [] Chills Cardiac: [] Chest pain   [] Chest pressure   [] Palpitations   [] Shortness of breath when laying flat   [] Shortness of breath with exertion. Vascular:  [] Pain in legs with walking   [] Pain in legs at rest  [] History of DVT   [] Phlebitis   [] Swelling in legs   [] Varicose veins   [] Non-healing ulcers Pulmonary:   [] Uses home oxygen   [] Productive cough   [] Hemoptysis   [] Wheeze  [] COPD   [] Asthma Neurologic:  [] Dizziness   [] Seizures   [] History of stroke   [] History of TIA  [] Aphasia   [] Vissual changes   [] Weakness or  numbness in arm   [] Weakness or numbness in leg Musculoskeletal:   [] Joint swelling   [] Joint pain   [] Low back pain Hematologic:  [] Easy bruising  [] Easy bleeding   [] Hypercoagulable state   [] Anemic Gastrointestinal:  [] Diarrhea   [] Vomiting  [] Gastroesophageal reflux/heartburn   [] Difficulty swallowing. Genitourinary:  [x] Chronic kidney disease   [] Difficult urination  [] Frequent urination   [] Blood in urine Skin:  [] Rashes   [] Ulcers  Psychological:  [] History of anxiety   []  History of major depression.  Physical Examination  There were no vitals filed for this visit. There is no height or weight on file to calculate BMI. Gen: WD/WN, NAD Head: [place]/AT, No temporalis wasting.  Ear/Nose/Throat: Hearing grossly intact, nares w/o erythema or drainage Eyes: PER, EOMI, sclera nonicteric.  Neck: Supple, no gross masses or lesions.  No JVD.  Pulmonary:  Good air movement, no audible wheezing, no use of accessory muscles.  Cardiac: RRR, precordium non-hyperdynamic. Vascular:   Left forearm loop graft no thrill no bruit Vessel Right Left  Radial Palpable Palpable  Brachial Palpable Palpable  Gastrointestinal: soft, non-distended. No guarding/no peritoneal signs.  Musculoskeletal: M/S 5/5 throughout.  No deformity.  Neurologic: CN 2-12 intact. Pain and light touch intact in extremities.  Symmetrical.  Speech is fluent. Motor exam as listed above. Psychiatric: Judgment intact, Mood & affect appropriate for pt's clinical situation. Dermatologic: No rashes or ulcers noted.  No changes consistent with cellulitis.   CBC Lab Results  Component Value Date   WBC 5.8 12/16/2023   HGB 13.3 12/16/2023   HCT 39.7 12/16/2023   MCV 97.8 12/16/2023   PLT 102 (L) 12/16/2023    BMET    Component Value Date/Time   NA 135 12/15/2023 0345   K 4.4 12/15/2023 0345   CL 99 12/15/2023 0345   CO2 22 12/15/2023 0345   GLUCOSE 225 (H) 12/15/2023 0345   BUN 77 (H) 12/15/2023 0345   CREATININE 15.98 (H)  12/15/2023 0345   CALCIUM  8.6 (L) 12/15/2023 0345  GFRNONAA 3 (L) 12/15/2023 0345   GFRAA 9 (L) 02/19/2020 1316   CrCl cannot be calculated (Patient's most recent lab result is older than the maximum 21 days allowed.).  COAG Lab Results  Component Value Date   INR 1.2 12/12/2023   INR 1.0 03/24/2023   INR 1.1 11/12/2022    Radiology VAS US  UPPER EXTREMITY ARTERIAL DUPLEX Result Date: 01/11/2024  UPPER EXTREMITY DUPLEX STUDY Patient Name:  ANTHONYJAMES BARGAR  Date of Exam:   01/09/2024 Medical Rec #: [id number]      Accession #:    [id number] Date of Birth: 1971-01-04      Patient Gender: M Patient Age:   11 years Exam Location:  [facility] Procedure:      VAS US  UPPER EXTREMITY ARTERIAL DUPLEX Referring Phys: Mikki Alexander --------------------------------------------------------------------------------  Indications: Pre Assessment for HDA.  Performing Technologist: Tonie Franks RVS  Examination Guidelines: A complete evaluation includes B-mode imaging, spectral Doppler, color Doppler, and power Doppler as needed of all accessible portions of each vessel. Bilateral testing is considered an integral part of a complete examination. Limited examinations for reoccurring indications may be performed as noted.  Right Pre-Dialysis Findings: +-----------------------+----------+--------------------+---------+--------+ Location               PSV (cm/s)Intralum. Diam. (cm)Waveform Comments +-----------------------+----------+--------------------+---------+--------+ Brachial Antecub. fossa81        0.51                triphasic         +-----------------------+----------+--------------------+---------+--------+ Radial Art at Wrist    74        0.27                triphasic         +-----------------------+----------+--------------------+---------+--------+ Ulnar Art at Wrist     68        0.21                triphasic          +-----------------------+----------+--------------------+---------+--------+   Summary:  Right: The Right Radial Artery, Ulnar Artery and Brachial Artery        display Triphasic Waveforms. *See table(s) above for measurements and observations. Electronically signed by Mikki Alexander MD on 01/11/2024 at 9:26:24 AM.    Final    VAS US  UPPER EXT VEIN MAPPING (PRE-OP  AVF) Result Date: 01/11/2024 UPPER EXTREMITY VEIN MAPPING Patient Name:  AVIGDOR DOLLAR  Date of Exam:   01/09/2024 Medical Rec #: [id number]      Accession #:    [id number] Date of Birth: Mar 24, 1971      Patient Gender: M Patient Age:   59 years Exam Location:  [facility] Procedure:      VAS US  UPPER EXT VEIN MAPPING (PRE-OP  AVF) Referring Phys: Mikki Alexander --------------------------------------------------------------------------------  Indications: Pre-access. Performing Technologist: Tonie Franks RVS  Examination Guidelines: A complete evaluation includes B-mode imaging, spectral Doppler, color Doppler, and power Doppler as needed of all accessible portions of each vessel. Bilateral testing is considered an integral part of a complete examination. Limited examinations for reoccurring indications may be performed as noted. +-----------------+-------------+----------+--------+ Right Cephalic   Diameter (cm)Depth (cm)Findings +-----------------+-------------+----------+--------+ Antecubital fossa    0.29                        +-----------------+-------------+----------+--------+ Prox forearm         0.24                        +-----------------+-------------+----------+--------+  Mid forearm          0.22                        +-----------------+-------------+----------+--------+ Dist forearm         0.21                        +-----------------+-------------+----------+--------+ +-----------------+-------------+----------+--------+ Right Basilic    Diameter (cm)Depth (cm)Findings  +-----------------+-------------+----------+--------+ Mid upper arm        0.49                        +-----------------+-------------+----------+--------+ Dist upper arm       0.38                        +-----------------+-------------+----------+--------+ Antecubital fossa    0.40                        +-----------------+-------------+----------+--------+ Prox forearm         0.23                        +-----------------+-------------+----------+--------+ Summary: Right: The Basilic Vein was measured throughout;Appears to be        adequate.         The Right Cephalic Vein appears to be adequate but not seen        in the Mid Upper Arm extending to the Shoulder region; due to        previous surgeries for HDA.  *See table(s) above for measurements and observations.  Diagnosing physician: Mikki Alexander MD Electronically signed by Mikki Alexander MD on 01/11/2024 at 9:24:56 AM.    Final      Assessment/Plan 1. Complication of vascular access for dialysis, sequela (Primary) Recommend:  At this time the patient does not have appropriate extremity access for dialysis  Patient should have a right forearm loop graft created.  The risks, benefits and alternative therapies were reviewed in detail with the patient.  All questions were answered.  The patient agrees to proceed with surgery.   The patient will follow up with me in the office after the surgery.  2. End stage renal disease (HCC) Recommend:  At this time the patient does not have appropriate extremity access for dialysis  Patient should have a right forearm loop graft created.  The risks, benefits and alternative therapies were reviewed in detail with the patient.  All questions were answered.  The patient agrees to proceed with surgery.   The patient will follow up with me in the office after the surgery.  3. Type 1 diabetes mellitus with other circulatory complication (HCC) Continue hypoglycemic medications as already  ordered, these medications have been reviewed and there are no changes at this time.  Hgb A1C to be monitored as already arranged by primary service  4. Chronic obstructive pulmonary disease, unspecified COPD type (HCC) Continue pulmonary medications and aerosols as already ordered, these medications have been reviewed and there are no changes at this time.   5. Primary hypertension Continue antihypertensive medications as already ordered, these medications have been reviewed and there are no changes at this time.    Devon Fogo, MD  01/28/2024 3:07 PM

## 2024-01-30 ENCOUNTER — Ambulatory Visit (INDEPENDENT_AMBULATORY_CARE_PROVIDER_SITE_OTHER): Admitting: Vascular Surgery

## 2024-01-30 ENCOUNTER — Encounter (INDEPENDENT_AMBULATORY_CARE_PROVIDER_SITE_OTHER): Payer: Self-pay | Admitting: Vascular Surgery

## 2024-01-30 VITALS — BP 172/74 | HR 73 | Resp 18 | Ht 76.0 in | Wt 397.0 lb

## 2024-01-30 DIAGNOSIS — E1059 Type 1 diabetes mellitus with other circulatory complications: Secondary | ICD-10-CM

## 2024-01-30 DIAGNOSIS — N186 End stage renal disease: Secondary | ICD-10-CM

## 2024-01-30 DIAGNOSIS — I10 Essential (primary) hypertension: Secondary | ICD-10-CM

## 2024-01-30 DIAGNOSIS — J449 Chronic obstructive pulmonary disease, unspecified: Secondary | ICD-10-CM | POA: Diagnosis not present

## 2024-01-30 DIAGNOSIS — T829XXS Unspecified complication of cardiac and vascular prosthetic device, implant and graft, sequela: Secondary | ICD-10-CM

## 2024-01-31 ENCOUNTER — Encounter (INDEPENDENT_AMBULATORY_CARE_PROVIDER_SITE_OTHER): Payer: Self-pay | Admitting: Vascular Surgery

## 2024-01-31 DIAGNOSIS — N186 End stage renal disease: Secondary | ICD-10-CM | POA: Diagnosis not present

## 2024-01-31 DIAGNOSIS — Z992 Dependence on renal dialysis: Secondary | ICD-10-CM | POA: Diagnosis not present

## 2024-02-02 DIAGNOSIS — N186 End stage renal disease: Secondary | ICD-10-CM | POA: Diagnosis not present

## 2024-02-02 DIAGNOSIS — Z992 Dependence on renal dialysis: Secondary | ICD-10-CM | POA: Diagnosis not present

## 2024-02-04 DIAGNOSIS — N186 End stage renal disease: Secondary | ICD-10-CM | POA: Diagnosis not present

## 2024-02-04 DIAGNOSIS — Z992 Dependence on renal dialysis: Secondary | ICD-10-CM | POA: Diagnosis not present

## 2024-02-06 ENCOUNTER — Telehealth: Payer: Self-pay | Admitting: *Deleted

## 2024-02-06 ENCOUNTER — Telehealth (INDEPENDENT_AMBULATORY_CARE_PROVIDER_SITE_OTHER): Payer: Self-pay

## 2024-02-06 NOTE — Telephone Encounter (Signed)
 Spoke with the patient and he is scheduled with Dr. Prescilla Brod for right forearm AV graft on 02/29/24 at [facility]. Pre-admit will call to schedule pre-op  at the [facility]. Pre-surgical instructions were discussed and will be mailed.

## 2024-02-06 NOTE — Progress Notes (Unsigned)
 Complex Care Management Care Guide Note  02/06/2024 Name: MOSTYN VARNELL MRN: [id number] DOB: 04/28/1971  Chester A Fails is a 53 y.o. year old male who is a primary care patient of [name], Genetta Kenning, MD and is actively engaged with the care management team. I reached out to Niam A Geraldo Klippel by phone today to assist with re-scheduling  with the RN Case Manager.  Follow up plan: Unsuccessful telephone outreach attempt made. A HIPAA compliant phone message was left for the patient providing contact information and requesting a return call.  Kandis Ormond, CMA [place]  [facility], [facility] Guide Direct Dial : [phone]  Fax: [phone] Website: [email or web address]

## 2024-02-07 DIAGNOSIS — Z992 Dependence on renal dialysis: Secondary | ICD-10-CM | POA: Diagnosis not present

## 2024-02-07 DIAGNOSIS — N186 End stage renal disease: Secondary | ICD-10-CM | POA: Diagnosis not present

## 2024-02-07 NOTE — Progress Notes (Signed)
 Complex Care Management Care Guide Note  02/07/2024 Name: [name] MRN: [id number] DOB: February 10, 1971  [name] is a 53 y.o. year old male who is a primary care patient of [name], Dale E, MD and is actively engaged with the care management team. I reached out to Zubin A Geraldo Klippel by phone today to assist with re-scheduling  with the RN Case Manager.  Follow up plan: Telephone appointment with complex care management team member scheduled for:  02/08/2024  Kandis Ormond, CMA [place]  [facility], [facility] Guide Direct Dial : [phone]  Fax: [phone] Website: [email or web address]

## 2024-02-08 ENCOUNTER — Encounter: Payer: Self-pay | Admitting: *Deleted

## 2024-02-08 ENCOUNTER — Other Ambulatory Visit: Payer: Self-pay | Admitting: *Deleted

## 2024-02-08 NOTE — Patient Outreach (Signed)
 Opened in error   [place] L. Mcarthur Speedy, RN, BSN, CCM [place]  [facility], [facility] RN Care Manager Direct Dial : [phone]  Fax: [phone]

## 2024-02-08 NOTE — Patient Outreach (Signed)
 Complex Care Management   Visit Note  03/06/2024 updated note for 02/08/24  Name:  [name] MRN: [id number] DOB: 09-10-1970  Situation: Referral received for Complex Care Management related to ESRD and Diabetes with Complications I obtained verbal consent from Patient.  Visit completed with [name]  on the phone  Background:   Past Medical History:  Diagnosis Date   Anemia    Chronic kidney disease    14% FUNCTION   Diabetes mellitus without complication (HCC)    Diabetic retinopathy (HCC)    Dyspnea    DOE   Dysrhythmia    End stage renal disease (HCC)    History of orthopnea    error wrong patietn   Hypercholesteremia    error   Hyperkalemia    error wrong patient   Hypertension    Long-term insulin  use (HCC)    Lymphedema of leg    Metabolic encephalopathy    error   Microalbuminuria    MSSA (methicillin susceptible Staphylococcus aureus)    Obesity    PVD (peripheral vascular disease) (HCC)    Respiratory failure (HCC)    Error   Sepsis (HCC)    error   Sleep apnea    no CPAP   UTI (urinary tract infection)    error   Venous ulcer (HCC)    Vitamin D deficiency     Assessment: Patient Reported Symptoms:  Cognitive Cognitive Status: Alert and oriented to person, place, and time, Insightful and able to interpret abstract concepts      Neurological Neurological Review of Symptoms: No symptoms reported Neurological Management Strategies: Adequate rest Neurological Self-Management Outcome: 4 (good)  HEENT HEENT Symptoms Reported: No symptoms reported HEENT Self-Management Outcome: 4 (good)    Cardiovascular Cardiovascular Symptoms Reported: No symptoms reported Does patient have uncontrolled Hypertension?: Yes Is patient checking Blood Pressure at home?: Yes Patient's Recent BP reading at home: 140s systolic Cardiovascular Management Strategies: Medical device, Medication therapy, Adequate rest Weight: (!) 395 lb (179.2 kg) Cardiovascular  Self-Management Outcome: 3 (uncertain)  Respiratory Respiratory Symptoms Reported: No symptoms reported Respiratory Self-Management Outcome: 4 (good)  Endocrine Is patient diabetic?: Yes Is patient checking blood sugars at home?: Yes List most recent blood sugar readings, include date and time of day: 147 today before breakfast 192 has been up in the 200s on Endocrine Self-Management Outcome: 3 (uncertain) Endocrine Comment: weight 395 lbs  Gastrointestinal Gastrointestinal Symptoms Reported: Constipation Additional Gastrointestinal Details: use miralax       Genitourinary Genitourinary Symptoms Reported: No symptoms reported    Integumentary Integumentary Symptoms Reported: Wound Additional Integumentary Details: wound healed    Musculoskeletal Musculoskelatal Symptoms Reviewed: Other Other Musculoskeletal Symptoms: lymphedema Musculoskeletal Management Strategies: Adequate rest, Activity Musculoskeletal Self-Management Outcome: 3 (uncertain) Falls in the past year?: No Number of falls in past year: 1 or less Was there an injury with Fall?: No Fall Risk Category Calculator: 0 Patient Fall Risk Level: Low Fall Risk Patient at Risk for Falls Due to: Other (Comment) (lymphedema) Fall risk Follow up: Falls evaluation completed  Psychosocial Psychosocial Symptoms Reported: Anxiety - if selected complete GAD Additional Psychological Details: take it day by day Behavioral Management Strategies: Coping strategies Behavioral Health Self-Management Outcome: 4 (good) Major Change/Loss/Stressor/Fears (CP): Medical condition, self Techniques to Cope with Loss/Stress/Change: Spiritual practice(s), Diversional activities (coooking, scriptures, detail his car) Quality of Family Relationships: helpful Do you feel physically threatened by others?: No      04/14/2023   12:01 PM  Depression screen [facility] 2/9  Decreased Interest 0  Down, Depressed, Hopeless 0  PHQ - 2 Score 0    There were no  vitals filed for this visit.  Medications Reviewed Today     Reviewed by Ramonita Suzen CROME, RN (Registered Nurse) on 03/06/24 at 1112  Med List Status: <None>   Medication Order Taking? Sig Documenting Provider Last Dose Status Informant  ACCU-CHEK GUIDE test strip [id number] Yes USE 1 STRIP TOTAL 2 TIMES DAILY FOR 90 DAYS [provider] Taking Active Self  acetaminophen  (TYLENOL ) 325 MG tablet [id number] Yes Take 2 tablets (650 mg total) by mouth every 6 (six) hours as needed for mild pain (or Fever >/= 101). Jens Durand, MD  Active Self  allopurinol  (ZYLOPRIM ) 100 MG tablet [id number] Yes Take 100 mg by mouth daily. [provider]  Active Self  amLODipine  (NORVASC ) 10 MG tablet [id number] Yes Take 5 mg by mouth daily as needed (if bp is 190/90 or higher).  [provider]  Active Self  apixaban  (ELIQUIS ) 5 MG TABS tablet [id number] Yes Take 1 tablet (5 mg total) by mouth 2 (two) times daily. Alexander, Natalie, DO  Active   atorvastatin  (LIPITOR) 80 MG tablet [id number] Yes Take 80 mg by mouth at bedtime. [provider]  Active Self  B Complex-C-Folic Acid  (RENA-VITE RX) 1 MG TABS [id number] Yes Take 1 tablet by mouth 3 (three) times a week.  [provider]  Active Self           Med Note BEVERLEE JUDGE CROME Pablo Mar 31, 2020  8:45 AM)    b complex-vitamin c -folic acid  (NEPHRO-VITE) 0.8 MG TABS tablet [id number] Yes Take 1 tablet by mouth daily. [provider]  Active Self  calcium  acetate (PHOSLO ) 667 MG capsule [id number] Yes Take 1,334-2,001 mg by mouth See admin instructions. Take 2001 mg by mouth with each meal & take 1334 mg by mouth with each snack. [provider]  Active Self  cinacalcet  (SENSIPAR ) 30 MG tablet [id number] Yes Take 60 mg by mouth daily. [provider]  Active Self  CINNAMON  PO [id number] Yes Take 500 mg by mouth 2 (two) times daily.  [provider]  Active Self  clopidogrel  (PLAVIX ) 75 MG  tablet [id number] Yes Take 1 tablet (75 mg total) by mouth daily. Alexander, Natalie, DO  Active Self  Continuous Glucose Sensor (DEXCOM G7 SENSOR) MISC [id number] Yes USE 1 EACH EVERY 10 (TEN) DAYS [provider] Taking Active Self  Insulin  Aspart FlexPen (NOVOLOG ) 100 UNIT/ML [id number] Yes Inject 5 Units subcutaneously 3 (three) times daily with meals SKIP dose if your sugar before your meal is your sugar is below 100. [provider]  Active Self  insulin  degludec (TRESIBA) 100 UNIT/ML FlexTouch Pen [id number] Yes Inject 30 Units subcutaneously once daily [provider]  Active Self           Med Note BEVERLEE RUNG   Mon Dec 12, 2023 10:37 PM) Currently 35 units per dose  lidocaine -prilocaine  (EMLA ) cream [id number] Yes Apply 1 application topically as needed (port access). [provider]  Active Self  Multiple Vitamins-Minerals (EYE HEALTH PO) [id number] Yes Take 1 capsule by mouth daily.  [provider]  Active Self  Polyethyl Glycol-Propyl Glycol (SYSTANE OP) [id number] Yes Place 1 drop into both eyes daily as needed (dry eyes). [provider]  Active Self  Semaglutide,0.25 or 0.5MG /DOS, (OZEMPIC, 0.25 OR 0.5 MG/DOSE,) 2 MG/1.5ML SOPN [id number] Yes Inject 0.25 mg into the skin. [provider]  Active             Recommendation:   Continue Current Plan of Care Continue to monitor blood pressure at home Continue to take Miralax , eat fruits & vegetables + maintain hydrated to reduce constipation & assist with lowering HgA1c Keep legs elevated, wrapped for management of lymphedema  Follow Up Plan:   Telephone follow up appointment date/time:  03/07/24 9 am  Ashar Lewinski L. Ramonita, RN, BSN, CCM [place]  [facility], [facility] RN Care Manager Direct Dial : [phone]  Fax: [phone]

## 2024-02-09 DIAGNOSIS — N186 End stage renal disease: Secondary | ICD-10-CM | POA: Diagnosis not present

## 2024-02-09 DIAGNOSIS — Z992 Dependence on renal dialysis: Secondary | ICD-10-CM | POA: Diagnosis not present

## 2024-02-11 DIAGNOSIS — Z992 Dependence on renal dialysis: Secondary | ICD-10-CM | POA: Diagnosis not present

## 2024-02-11 DIAGNOSIS — N186 End stage renal disease: Secondary | ICD-10-CM | POA: Diagnosis not present

## 2024-02-14 DIAGNOSIS — Z992 Dependence on renal dialysis: Secondary | ICD-10-CM | POA: Diagnosis not present

## 2024-02-14 DIAGNOSIS — N186 End stage renal disease: Secondary | ICD-10-CM | POA: Diagnosis not present

## 2024-02-15 ENCOUNTER — Other Ambulatory Visit (INDEPENDENT_AMBULATORY_CARE_PROVIDER_SITE_OTHER): Payer: Self-pay | Admitting: Nurse Practitioner

## 2024-02-15 ENCOUNTER — Encounter
Admission: RE | Admit: 2024-02-15 | Discharge: 2024-02-15 | Disposition: A | Discharge status: Home / Self Care | Source: Ambulatory Visit | Attending: Vascular Surgery | Admitting: Vascular Surgery

## 2024-02-15 ENCOUNTER — Other Ambulatory Visit: Payer: Self-pay

## 2024-02-15 VITALS — BP 125/82 | HR 80 | Wt 395.5 lb

## 2024-02-15 DIAGNOSIS — E1165 Type 2 diabetes mellitus with hyperglycemia: Secondary | ICD-10-CM | POA: Diagnosis not present

## 2024-02-15 DIAGNOSIS — I120 Hypertensive chronic kidney disease with stage 5 chronic kidney disease or end stage renal disease: Secondary | ICD-10-CM | POA: Insufficient documentation

## 2024-02-15 DIAGNOSIS — I770 Arteriovenous fistula, acquired: Secondary | ICD-10-CM

## 2024-02-15 DIAGNOSIS — Z01812 Encounter for preprocedural laboratory examination: Secondary | ICD-10-CM | POA: Insufficient documentation

## 2024-02-15 DIAGNOSIS — Z794 Long term (current) use of insulin: Secondary | ICD-10-CM | POA: Diagnosis not present

## 2024-02-15 DIAGNOSIS — I10 Essential (primary) hypertension: Secondary | ICD-10-CM

## 2024-02-15 DIAGNOSIS — N186 End stage renal disease: Secondary | ICD-10-CM | POA: Insufficient documentation

## 2024-02-15 DIAGNOSIS — E1122 Type 2 diabetes mellitus with diabetic chronic kidney disease: Secondary | ICD-10-CM | POA: Insufficient documentation

## 2024-02-15 DIAGNOSIS — N185 Chronic kidney disease, stage 5: Secondary | ICD-10-CM

## 2024-02-15 LAB — CBC
HCT: 41.1 % (ref 39.0–52.0)
Hemoglobin: 13.6 g/dL (ref 13.0–17.0)
MCH: 33.1 pg (ref 26.0–34.0)
MCHC: 33.1 g/dL (ref 30.0–36.0)
MCV: 100 fL (ref 80.0–100.0)
Platelets: 168 10*3/uL (ref 150–400)
RBC: 4.11 MIL/uL — ABNORMAL LOW (ref 4.22–5.81)
RDW: 14.6 % (ref 11.5–15.5)
WBC: 6.1 10*3/uL (ref 4.0–10.5)
nRBC: 0 % (ref 0.0–0.2)

## 2024-02-15 LAB — TYPE AND SCREEN
ABO/RH(D): A POS
Antibody Screen: NEGATIVE

## 2024-02-15 LAB — BASIC METABOLIC PANEL WITH GFR
Anion gap: 14 (ref 5–15)
BUN: 61 mg/dL — ABNORMAL HIGH (ref 6–20)
CO2: 24 mmol/L (ref 22–32)
Calcium: 9.1 mg/dL (ref 8.9–10.3)
Chloride: 100 mmol/L (ref 98–111)
Creatinine, Ser: 12.44 mg/dL — ABNORMAL HIGH (ref 0.61–1.24)
GFR, Estimated: 4 mL/min — ABNORMAL LOW (ref >60)
Glucose, Bld: 100 mg/dL — ABNORMAL HIGH (ref 70–99)
Potassium: 5 mmol/L (ref 3.5–5.1)
Sodium: 138 mmol/L (ref 135–145)

## 2024-02-15 NOTE — Patient Instructions (Addendum)
 Your procedure is scheduled on: 02/29/24 - Wednesday Report to the Registration Desk on the 1st floor of the [facility]. To find out your arrival time, please call [phone] between 1PM - 3PM on: 02/28/24 - Tuesday If your arrival time is 6:00 am, do not arrive before that time as the [facility] entrance doors do not open until 6:00 am.  REMEMBER: Instructions that are not followed completely may result in serious medical risk, up to and including death; or upon the discretion of your surgeon and anesthesiologist your surgery may need to be rescheduled.  Do not eat food after midnight the night before surgery.  No gum chewing or hard candies.  You may however, drink CLEAR liquids up to 2 hours before you are scheduled to arrive for your surgery. Do not drink anything within 2 hours of your scheduled arrival time.  Clear liquids include: - water   One week prior to surgery: Stop Anti-inflammatories (NSAIDS) such as Advil, Aleve, Ibuprofen, Motrin, Naproxen, Naprosyn and Aspirin  based products such as Excedrin, Goody's Powder, BC Powder. You may continue to take Tylenol  if needed for pain up until the day of surgery.  Stop taking beginning 06/25, ANY OVER THE COUNTER supplements until after surgery.  insulin  degludec (TRESIBA) - hold morning dose.  apixaban  (ELIQUIS ) - stop beginning 02/27/24, resume with doctor order.   ON THE DAY OF SURGERY ONLY TAKE THESE MEDICATIONS WITH SIPS OF WATER:  allopurinol  (ZYLOPRIM )  amLODipine  (NORVASC ) if needed cinacalcet  (SENSIPAR )    No Alcohol  for 24 hours before or after surgery.  No Smoking including e-cigarettes for 24 hours before surgery.  No chewable tobacco products for at least 6 hours before surgery.  No nicotine patches on the day of surgery.  Do not use any recreational drugs for at least a week (preferably 2 weeks) before your surgery.  Please be advised that the combination of cocaine and anesthesia may have negative  outcomes, up to and including death. If you test positive for cocaine, your surgery will be cancelled.  On the morning of surgery brush your teeth with toothpaste and water, you may rinse your mouth with mouthwash if you wish. Do not swallow any toothpaste or mouthwash.  Use CHG Soap or wipes as directed on instruction sheet.  Do not wear jewelry, make-up, hairpins, clips or nail polish.  For welded (permanent) jewelry: bracelets, anklets, waist bands, etc.  Please have this removed prior to surgery.  If it is not removed, there is a chance that hospital personnel will need to cut it off on the day of surgery.  Do not wear lotions, powders, or perfumes.   Do not shave body hair from the neck down 48 hours before surgery.  Contact lenses, hearing aids and dentures may not be worn into surgery.  Do not bring valuables to the hospital. [facility] is not responsible for any missing/lost belongings or valuables.   Notify your doctor if there is any change in your medical condition (cold, fever, infection).  Wear comfortable clothing (specific to your surgery type) to the hospital.  After surgery, you can help prevent lung complications by doing breathing exercises.  Take deep breaths and cough every 1-2 hours. Your doctor may order a device called an Incentive Spirometer to help you take deep breaths.  When coughing or sneezing, hold a pillow firmly against your incision with both hands. This is called "splinting." Doing this helps protect your incision. It also decreases belly discomfort.  If you are  being admitted to the hospital overnight, leave your suitcase in the car. After surgery it may be brought to your room.  In case of increased patient census, it may be necessary for you, the patient, to continue your postoperative care in the Same Day Surgery department.  If you are being discharged the day of surgery, you will not be allowed to drive home. You will need a responsible  individual to drive you home and stay with you for 24 hours after surgery.   If you are taking public transportation, you will need to have a responsible individual with you.  Please call the Pre-admissions Testing Dept. at [phone] if you have any questions about these instructions.  Surgery Visitation Policy:  Patients having surgery or a procedure may have two visitors.  Children under the age of 82 must have an adult with them who is not the patient.  Inpatient Visitation:    Visiting hours are 7 a.m. to 8 p.m. Up to four visitors are allowed at one time in a patient room. The visitors may rotate out with other people during the day.  One visitor age 35 or older may stay with the patient overnight and must be in the room by 8 p.m.    Preparing for Surgery with CHLORHEXIDINE  GLUCONATE (CHG) Soap  Chlorhexidine  Gluconate (CHG) Soap  o An antiseptic cleaner that kills germs and bonds with the skin to continue killing germs even after washing  o Used for showering the night before surgery and morning of surgery  Before surgery, you can play an important role by reducing the number of germs on your skin.  CHG (Chlorhexidine  gluconate) soap is an antiseptic cleanser which kills germs and bonds with the skin to continue killing germs even after washing.  Please do not use if you have an allergy to CHG or antibacterial soaps. If your skin becomes reddened/irritated stop using the CHG.  1. Shower the NIGHT BEFORE SURGERY and the MORNING OF SURGERY with CHG soap.  2. If you choose to wash your hair, wash your hair first as usual with your normal shampoo.  3. After shampooing, rinse your hair and body thoroughly to remove the shampoo.  4. Use CHG as you would any other liquid soap. You can apply CHG directly to the skin and wash gently with a scrungie or a clean washcloth.  5. Apply the CHG soap to your body only from the neck down. Do not use on open wounds or open sores.  Avoid contact with your eyes, ears, mouth, and genitals (private parts). Wash face and genitals (private parts) with your normal soap.  6. Wash thoroughly, paying special attention to the area where your surgery will be performed.  7. Thoroughly rinse your body with warm water.  8. Do not shower/wash with your normal soap after using and rinsing off the CHG soap.  9. Pat yourself dry with a clean towel.  10. Wear clean pajamas to bed the night before surgery.  12. Place clean sheets on your bed the night of your first shower and do not sleep with pets.  13. Shower again with the CHG soap on the day of surgery prior to arriving at the hospital.  14. Do not apply any deodorants/lotions/powders.  15. Please wear clean clothes to the hospital.

## 2024-02-16 DIAGNOSIS — N186 End stage renal disease: Secondary | ICD-10-CM | POA: Diagnosis not present

## 2024-02-16 DIAGNOSIS — Z992 Dependence on renal dialysis: Secondary | ICD-10-CM | POA: Diagnosis not present

## 2024-02-18 DIAGNOSIS — N186 End stage renal disease: Secondary | ICD-10-CM | POA: Diagnosis not present

## 2024-02-18 DIAGNOSIS — Z992 Dependence on renal dialysis: Secondary | ICD-10-CM | POA: Diagnosis not present

## 2024-02-21 DIAGNOSIS — Z79899 Other long term (current) drug therapy: Secondary | ICD-10-CM | POA: Diagnosis not present

## 2024-02-21 DIAGNOSIS — N186 End stage renal disease: Secondary | ICD-10-CM | POA: Diagnosis not present

## 2024-02-21 DIAGNOSIS — Z992 Dependence on renal dialysis: Secondary | ICD-10-CM | POA: Diagnosis not present

## 2024-02-23 ENCOUNTER — Other Ambulatory Visit: Payer: Self-pay

## 2024-02-23 DIAGNOSIS — N186 End stage renal disease: Secondary | ICD-10-CM | POA: Diagnosis not present

## 2024-02-23 DIAGNOSIS — Z992 Dependence on renal dialysis: Secondary | ICD-10-CM | POA: Diagnosis not present

## 2024-02-24 ENCOUNTER — Telehealth (INDEPENDENT_AMBULATORY_CARE_PROVIDER_SITE_OTHER): Payer: Self-pay

## 2024-02-24 NOTE — Telephone Encounter (Signed)
 Spoke with the patient and he has been rescheduled from 02/29/24 to 03/14/24 for his right forearm AV graft with Dr. Jama. Patient was very angry and irate due to being rescheduled and was demanding to know why.

## 2024-02-25 DIAGNOSIS — Z992 Dependence on renal dialysis: Secondary | ICD-10-CM | POA: Diagnosis not present

## 2024-02-25 DIAGNOSIS — N186 End stage renal disease: Secondary | ICD-10-CM | POA: Diagnosis not present

## 2024-02-27 DIAGNOSIS — Z992 Dependence on renal dialysis: Secondary | ICD-10-CM | POA: Diagnosis not present

## 2024-02-27 DIAGNOSIS — N186 End stage renal disease: Secondary | ICD-10-CM | POA: Diagnosis not present

## 2024-02-28 DIAGNOSIS — Z992 Dependence on renal dialysis: Secondary | ICD-10-CM | POA: Diagnosis not present

## 2024-02-28 DIAGNOSIS — T82898A Other specified complication of vascular prosthetic devices, implants and grafts, initial encounter: Secondary | ICD-10-CM | POA: Diagnosis not present

## 2024-02-28 DIAGNOSIS — N186 End stage renal disease: Secondary | ICD-10-CM | POA: Diagnosis not present

## 2024-02-28 DIAGNOSIS — T82848A Pain from vascular prosthetic devices, implants and grafts, initial encounter: Secondary | ICD-10-CM | POA: Diagnosis not present

## 2024-03-01 DIAGNOSIS — N186 End stage renal disease: Secondary | ICD-10-CM | POA: Diagnosis not present

## 2024-03-01 DIAGNOSIS — Z992 Dependence on renal dialysis: Secondary | ICD-10-CM | POA: Diagnosis not present

## 2024-03-01 DIAGNOSIS — T82898A Other specified complication of vascular prosthetic devices, implants and grafts, initial encounter: Secondary | ICD-10-CM | POA: Diagnosis not present

## 2024-03-01 DIAGNOSIS — T82848A Pain from vascular prosthetic devices, implants and grafts, initial encounter: Secondary | ICD-10-CM | POA: Diagnosis not present

## 2024-03-03 DIAGNOSIS — Z992 Dependence on renal dialysis: Secondary | ICD-10-CM | POA: Diagnosis not present

## 2024-03-03 DIAGNOSIS — T82848A Pain from vascular prosthetic devices, implants and grafts, initial encounter: Secondary | ICD-10-CM | POA: Diagnosis not present

## 2024-03-03 DIAGNOSIS — T82898A Other specified complication of vascular prosthetic devices, implants and grafts, initial encounter: Secondary | ICD-10-CM | POA: Diagnosis not present

## 2024-03-03 DIAGNOSIS — N186 End stage renal disease: Secondary | ICD-10-CM | POA: Diagnosis not present

## 2024-03-06 DIAGNOSIS — T82898A Other specified complication of vascular prosthetic devices, implants and grafts, initial encounter: Secondary | ICD-10-CM | POA: Diagnosis not present

## 2024-03-06 DIAGNOSIS — Z992 Dependence on renal dialysis: Secondary | ICD-10-CM | POA: Insufficient documentation

## 2024-03-06 DIAGNOSIS — N186 End stage renal disease: Secondary | ICD-10-CM | POA: Diagnosis not present

## 2024-03-06 DIAGNOSIS — T82848A Pain from vascular prosthetic devices, implants and grafts, initial encounter: Secondary | ICD-10-CM | POA: Diagnosis not present

## 2024-03-06 NOTE — Patient Instructions (Signed)
 Visit Information  Thank you for taking time to visit with me today. Please don't hesitate to contact me if I can be of assistance to you before our next scheduled appointment.  Your next care management appointment is by telephone on 03/07/24 at 9 am  Please outreach if you need assistance between MD visits  Please call the care guide team at [phone] if you need to cancel, schedule, or reschedule an appointment.   Please call the Suicide and Crisis Lifeline: 988 call the [place]  National Suicide Prevention Lifeline: 626 015 6455 or TTY: 819-380-8142 TTY 269 740 0420) to talk to a trained counselor call 1-800-273-TALK (toll free, 24 hour hotline) call 911 if you are experiencing a Mental Health or Behavioral Health Crisis or need someone to talk to.  Ketty Bitton L. Ramonita, RN, BSN, CCM [place]  [facility], [facility] RN Care Manager Direct Dial : [phone]  Fax: [phone]

## 2024-03-06 NOTE — Addendum Note (Signed)
 Addended by: RAMONITA SUZEN CROME on: 03/06/2024 11:34 AM   Modules accepted: Orders

## 2024-03-07 ENCOUNTER — Telehealth: Payer: Self-pay | Admitting: *Deleted

## 2024-03-07 ENCOUNTER — Encounter: Payer: Self-pay | Admitting: *Deleted

## 2024-03-07 DIAGNOSIS — Z0100 Encounter for examination of eyes and vision without abnormal findings: Secondary | ICD-10-CM | POA: Diagnosis not present

## 2024-03-07 DIAGNOSIS — H524 Presbyopia: Secondary | ICD-10-CM | POA: Diagnosis not present

## 2024-03-07 DIAGNOSIS — Z961 Presence of intraocular lens: Secondary | ICD-10-CM | POA: Diagnosis not present

## 2024-03-07 DIAGNOSIS — H4312 Vitreous hemorrhage, left eye: Secondary | ICD-10-CM | POA: Diagnosis not present

## 2024-03-07 DIAGNOSIS — E113592 Type 2 diabetes mellitus with proliferative diabetic retinopathy without macular edema, left eye: Secondary | ICD-10-CM | POA: Diagnosis not present

## 2024-03-07 DIAGNOSIS — E113593 Type 2 diabetes mellitus with proliferative diabetic retinopathy without macular edema, bilateral: Secondary | ICD-10-CM | POA: Diagnosis not present

## 2024-03-08 DIAGNOSIS — T82898A Other specified complication of vascular prosthetic devices, implants and grafts, initial encounter: Secondary | ICD-10-CM | POA: Diagnosis not present

## 2024-03-08 DIAGNOSIS — N186 End stage renal disease: Secondary | ICD-10-CM | POA: Diagnosis not present

## 2024-03-08 DIAGNOSIS — T82848A Pain from vascular prosthetic devices, implants and grafts, initial encounter: Secondary | ICD-10-CM | POA: Diagnosis not present

## 2024-03-08 DIAGNOSIS — Z992 Dependence on renal dialysis: Secondary | ICD-10-CM | POA: Diagnosis not present

## 2024-03-10 DIAGNOSIS — Z992 Dependence on renal dialysis: Secondary | ICD-10-CM | POA: Diagnosis not present

## 2024-03-10 DIAGNOSIS — T82898A Other specified complication of vascular prosthetic devices, implants and grafts, initial encounter: Secondary | ICD-10-CM | POA: Diagnosis not present

## 2024-03-10 DIAGNOSIS — N186 End stage renal disease: Secondary | ICD-10-CM | POA: Diagnosis not present

## 2024-03-10 DIAGNOSIS — T82848A Pain from vascular prosthetic devices, implants and grafts, initial encounter: Secondary | ICD-10-CM | POA: Diagnosis not present

## 2024-03-13 ENCOUNTER — Telehealth: Payer: Self-pay

## 2024-03-13 DIAGNOSIS — N186 End stage renal disease: Secondary | ICD-10-CM | POA: Diagnosis not present

## 2024-03-13 DIAGNOSIS — Z992 Dependence on renal dialysis: Secondary | ICD-10-CM | POA: Diagnosis not present

## 2024-03-13 DIAGNOSIS — T82898A Other specified complication of vascular prosthetic devices, implants and grafts, initial encounter: Secondary | ICD-10-CM | POA: Diagnosis not present

## 2024-03-13 DIAGNOSIS — T82848A Pain from vascular prosthetic devices, implants and grafts, initial encounter: Secondary | ICD-10-CM | POA: Diagnosis not present

## 2024-03-13 NOTE — Progress Notes (Unsigned)
 Complex Care Management Care Guide Note  03/13/2024 Name: [name] MRN: [id number] DOB: March 02, 1971  [name] is a 53 y.o. year old male who is a primary care patient of [name], Dale E, MD and is actively engaged with the care management team. I reached out to Giovanne A Baptist by phone today to assist with re-scheduling  with the RN Case Manager.  Follow up plan: Unsuccessful telephone outreach attempt made. A HIPAA compliant phone message was left for the patient providing contact information and requesting a return call.  [name] [facility], [facility] Guide  Direct Dial : [phone]  Fax [phone]

## 2024-03-14 ENCOUNTER — Encounter: Payer: Self-pay | Admitting: Vascular Surgery

## 2024-03-14 ENCOUNTER — Encounter
Admission: RE | Disposition: A | Discharge status: Home / Self Care | Payer: Self-pay | Source: Home / Self Care | Attending: Vascular Surgery

## 2024-03-14 ENCOUNTER — Ambulatory Visit

## 2024-03-14 ENCOUNTER — Ambulatory Visit
Admission: RE | Admit: 2024-03-14 | Discharge: 2024-03-14 | Disposition: A | Discharge status: Home / Self Care | Attending: Vascular Surgery | Admitting: Vascular Surgery

## 2024-03-14 ENCOUNTER — Other Ambulatory Visit: Payer: Self-pay

## 2024-03-14 DIAGNOSIS — E1121 Type 2 diabetes mellitus with diabetic nephropathy: Secondary | ICD-10-CM | POA: Diagnosis not present

## 2024-03-14 DIAGNOSIS — N186 End stage renal disease: Secondary | ICD-10-CM

## 2024-03-14 DIAGNOSIS — I120 Hypertensive chronic kidney disease with stage 5 chronic kidney disease or end stage renal disease: Secondary | ICD-10-CM | POA: Diagnosis not present

## 2024-03-14 DIAGNOSIS — Z6841 Body Mass Index (BMI) 40.0 and over, adult: Secondary | ICD-10-CM | POA: Diagnosis not present

## 2024-03-14 DIAGNOSIS — E6689 Other obesity not elsewhere classified: Secondary | ICD-10-CM | POA: Diagnosis not present

## 2024-03-14 DIAGNOSIS — J449 Chronic obstructive pulmonary disease, unspecified: Secondary | ICD-10-CM | POA: Insufficient documentation

## 2024-03-14 DIAGNOSIS — E1022 Type 1 diabetes mellitus with diabetic chronic kidney disease: Secondary | ICD-10-CM | POA: Insufficient documentation

## 2024-03-14 DIAGNOSIS — Z992 Dependence on renal dialysis: Secondary | ICD-10-CM

## 2024-03-14 DIAGNOSIS — G4730 Sleep apnea, unspecified: Secondary | ICD-10-CM | POA: Diagnosis not present

## 2024-03-14 DIAGNOSIS — E1051 Type 1 diabetes mellitus with diabetic peripheral angiopathy without gangrene: Secondary | ICD-10-CM | POA: Insufficient documentation

## 2024-03-14 DIAGNOSIS — Z79899 Other long term (current) drug therapy: Secondary | ICD-10-CM | POA: Insufficient documentation

## 2024-03-14 DIAGNOSIS — E1065 Type 1 diabetes mellitus with hyperglycemia: Secondary | ICD-10-CM | POA: Diagnosis not present

## 2024-03-14 DIAGNOSIS — E1165 Type 2 diabetes mellitus with hyperglycemia: Secondary | ICD-10-CM | POA: Diagnosis not present

## 2024-03-14 DIAGNOSIS — Z794 Long term (current) use of insulin: Secondary | ICD-10-CM | POA: Diagnosis not present

## 2024-03-14 DIAGNOSIS — Z01812 Encounter for preprocedural laboratory examination: Secondary | ICD-10-CM

## 2024-03-14 DIAGNOSIS — E11311 Type 2 diabetes mellitus with unspecified diabetic retinopathy with macular edema: Secondary | ICD-10-CM | POA: Diagnosis not present

## 2024-03-14 DIAGNOSIS — E1122 Type 2 diabetes mellitus with diabetic chronic kidney disease: Secondary | ICD-10-CM | POA: Diagnosis not present

## 2024-03-14 DIAGNOSIS — Z87891 Personal history of nicotine dependence: Secondary | ICD-10-CM | POA: Diagnosis not present

## 2024-03-14 DIAGNOSIS — E1151 Type 2 diabetes mellitus with diabetic peripheral angiopathy without gangrene: Secondary | ICD-10-CM | POA: Diagnosis not present

## 2024-03-14 HISTORY — PX: INSERTION OF ARTERIOVENOUS (AV) ARTEGRAFT ARM: SHX6779

## 2024-03-14 LAB — POCT I-STAT, CHEM 8
BUN: 50 mg/dL — ABNORMAL HIGH (ref 6–20)
Calcium, Ion: 1.13 mmol/L — ABNORMAL LOW (ref 1.15–1.40)
Chloride: 103 mmol/L (ref 98–111)
Creatinine, Ser: 14.6 mg/dL — ABNORMAL HIGH (ref 0.61–1.24)
Glucose, Bld: 89 mg/dL (ref 70–99)
HCT: 49 % (ref 39.0–52.0)
Hemoglobin: 16.7 g/dL (ref 13.0–17.0)
Potassium: 5.2 mmol/L — ABNORMAL HIGH (ref 3.5–5.1)
Sodium: 139 mmol/L (ref 135–145)
TCO2: 23 mmol/L (ref 22–32)

## 2024-03-14 LAB — GLUCOSE, CAPILLARY: Glucose-Capillary: 85 mg/dL (ref 70–99)

## 2024-03-14 LAB — TYPE AND SCREEN
ABO/RH(D): A POS
Antibody Screen: NEGATIVE

## 2024-03-14 SURGERY — INSERTION, GRAFT, ARTERIOVENOUS, UPPER EXTREMITY
Anesthesia: General | Laterality: Right

## 2024-03-14 MED ORDER — LIDOCAINE HCL (CARDIAC) PF 100 MG/5ML IV SOSY
PREFILLED_SYRINGE | INTRAVENOUS | Status: DC | PRN
  Administered 2024-03-14: 80 mg via INTRAVENOUS

## 2024-03-14 MED ORDER — DEXAMETHASONE SODIUM PHOSPHATE 10 MG/ML IJ SOLN
INTRAMUSCULAR | Status: DC | PRN
  Administered 2024-03-14: 10 mg via INTRAVENOUS

## 2024-03-14 MED ORDER — CHLORHEXIDINE GLUCONATE CLOTH 2 % EX PADS
6.0000 | MEDICATED_PAD | Freq: Once | CUTANEOUS | Status: AC
  Administered 2024-03-14: 6 via TOPICAL

## 2024-03-14 MED ORDER — LIDOCAINE HCL (PF) 2 % IJ SOLN
INTRAMUSCULAR | Status: AC
Start: 2024-03-14 — End: 2024-03-14
  Filled 2024-03-14: qty 5

## 2024-03-14 MED ORDER — CHLORHEXIDINE GLUCONATE 0.12 % MT SOLN
OROMUCOSAL | Status: AC
  Filled 2024-03-14: qty 15

## 2024-03-14 MED ORDER — DEXAMETHASONE SODIUM PHOSPHATE 10 MG/ML IJ SOLN
INTRAMUSCULAR | Status: AC
  Filled 2024-03-14: qty 1

## 2024-03-14 MED ORDER — PHENYLEPHRINE HCL (PRESSORS) 10 MG/ML IV SOLN
INTRAVENOUS | Status: AC
  Filled 2024-03-14: qty 1

## 2024-03-14 MED ORDER — ACETAMINOPHEN 10 MG/ML IV SOLN
INTRAVENOUS | Status: AC
  Filled 2024-03-14: qty 100

## 2024-03-14 MED ORDER — DEXMEDETOMIDINE HCL IN NACL 80 MCG/20ML IV SOLN
INTRAVENOUS | Status: DC | PRN
  Administered 2024-03-14: 8 ug via INTRAVENOUS
  Administered 2024-03-14 (×3): 4 ug via INTRAVENOUS

## 2024-03-14 MED ORDER — HYDROMORPHONE HCL 1 MG/ML IJ SOLN: 1.0000 mg | Freq: Once | INTRAMUSCULAR | Status: DC | PRN

## 2024-03-14 MED ORDER — SODIUM CHLORIDE 0.9 % IV SOLN: INTRAVENOUS | Status: DC

## 2024-03-14 MED ORDER — APIXABAN 5 MG PO TABS: 5.0000 mg | ORAL_TABLET | Freq: Two times a day (BID) | ORAL | 0 refills | Status: DC

## 2024-03-14 MED ORDER — EPHEDRINE 5 MG/ML INJ
INTRAVENOUS | Status: AC
Start: 2024-03-14 — End: 2024-03-14
  Filled 2024-03-14: qty 5

## 2024-03-14 MED ORDER — CEFAZOLIN SODIUM-DEXTROSE 2-4 GM/100ML-% IV SOLN
2.0000 g | INTRAVENOUS | Status: AC
  Administered 2024-03-14: 3 g via INTRAVENOUS

## 2024-03-14 MED ORDER — SUGAMMADEX SODIUM 200 MG/2ML IV SOLN
INTRAVENOUS | Status: DC | PRN
  Administered 2024-03-14: 400 mg via INTRAVENOUS

## 2024-03-14 MED ORDER — ONDANSETRON HCL 4 MG/2ML IJ SOLN
INTRAMUSCULAR | Status: AC
  Filled 2024-03-14: qty 2

## 2024-03-14 MED ORDER — CEFAZOLIN SODIUM-DEXTROSE 2-4 GM/100ML-% IV SOLN
INTRAVENOUS | Status: AC
  Filled 2024-03-14: qty 100

## 2024-03-14 MED ORDER — ROCURONIUM BROMIDE 100 MG/10ML IV SOLN
INTRAVENOUS | Status: DC | PRN
  Administered 2024-03-14: 70 mg via INTRAVENOUS
  Administered 2024-03-14: 20 mg via INTRAVENOUS
  Administered 2024-03-14: 10 mg via INTRAVENOUS

## 2024-03-14 MED ORDER — FENTANYL CITRATE (PF) 100 MCG/2ML IJ SOLN
INTRAMUSCULAR | Status: AC
  Filled 2024-03-14: qty 2

## 2024-03-14 MED ORDER — PHENYLEPHRINE HCL-NACL 20-0.9 MG/250ML-% IV SOLN
INTRAVENOUS | Status: AC
  Filled 2024-03-14: qty 250

## 2024-03-14 MED ORDER — ONDANSETRON HCL 4 MG/2ML IJ SOLN
INTRAMUSCULAR | Status: DC | PRN
  Administered 2024-03-14: 4 mg via INTRAVENOUS

## 2024-03-14 MED ORDER — OXYCODONE HCL 5 MG PO TABS: 5.0000 mg | ORAL_TABLET | Freq: Once | ORAL | Status: DC | PRN

## 2024-03-14 MED ORDER — BUPIVACAINE LIPOSOME 1.3 % IJ SUSP
INTRAMUSCULAR | Status: DC | PRN
  Administered 2024-03-14: 50 mL

## 2024-03-14 MED ORDER — MIDAZOLAM HCL 2 MG/2ML IJ SOLN
INTRAMUSCULAR | Status: AC
Start: 2024-03-14 — End: 2024-03-14
  Filled 2024-03-14: qty 2

## 2024-03-14 MED ORDER — CHLORHEXIDINE GLUCONATE 0.12 % MT SOLN
15.0000 mL | Freq: Once | OROMUCOSAL | Status: AC
  Administered 2024-03-14: 15 mL via OROMUCOSAL

## 2024-03-14 MED ORDER — CEFAZOLIN SODIUM 1 G IJ SOLR
INTRAMUSCULAR | Status: AC
  Filled 2024-03-14: qty 10

## 2024-03-14 MED ORDER — BUPIVACAINE HCL (PF) 0.5 % IJ SOLN
INTRAMUSCULAR | Status: AC
  Filled 2024-03-14: qty 30

## 2024-03-14 MED ORDER — EPHEDRINE SULFATE-NACL 50-0.9 MG/10ML-% IV SOSY
PREFILLED_SYRINGE | INTRAVENOUS | Status: DC | PRN
  Administered 2024-03-14 (×3): 5 mg via INTRAVENOUS

## 2024-03-14 MED ORDER — HEPARIN SODIUM (PORCINE) 5000 UNIT/ML IJ SOLN
INTRAMUSCULAR | Status: AC
  Filled 2024-03-14: qty 1

## 2024-03-14 MED ORDER — PROPOFOL 10 MG/ML IV BOLUS
INTRAVENOUS | Status: DC | PRN
  Administered 2024-03-14: 160 mg via INTRAVENOUS
  Administered 2024-03-14: 50 mg via INTRAVENOUS

## 2024-03-14 MED ORDER — ORAL CARE MOUTH RINSE
15.0000 mL | Freq: Once | OROMUCOSAL | Status: AC
Start: 2024-03-14 — End: 2024-03-14

## 2024-03-14 MED ORDER — ONDANSETRON HCL 4 MG/2ML IJ SOLN: 4.0000 mg | Freq: Four times a day (QID) | INTRAMUSCULAR | Status: DC | PRN

## 2024-03-14 MED ORDER — ACETAMINOPHEN 10 MG/ML IV SOLN: 1000.0000 mg | Freq: Once | INTRAVENOUS | Status: DC | PRN

## 2024-03-14 MED ORDER — PHENYLEPHRINE HCL-NACL 20-0.9 MG/250ML-% IV SOLN
INTRAVENOUS | Status: DC | PRN
Start: 2024-03-14 — End: 2024-03-14
  Administered 2024-03-14: 40 ug/min via INTRAVENOUS

## 2024-03-14 MED ORDER — HYDROCODONE-ACETAMINOPHEN 5-325 MG PO TABS: 1.0000 | ORAL_TABLET | Freq: Four times a day (QID) | ORAL | 0 refills | Status: AC | PRN

## 2024-03-14 MED ORDER — FENTANYL CITRATE (PF) 100 MCG/2ML IJ SOLN: 25.0000 ug | INTRAMUSCULAR | Status: DC | PRN

## 2024-03-14 MED ORDER — PHENYLEPHRINE HCL (PRESSORS) 10 MG/ML IV SOLN
INTRAVENOUS | Status: AC
Start: 2024-03-14 — End: 2024-03-14
  Filled 2024-03-14: qty 1

## 2024-03-14 MED ORDER — MIDAZOLAM HCL 2 MG/2ML IJ SOLN
INTRAMUSCULAR | Status: DC | PRN
  Administered 2024-03-14: 2 mg via INTRAVENOUS

## 2024-03-14 MED ORDER — FENTANYL CITRATE (PF) 100 MCG/2ML IJ SOLN
INTRAMUSCULAR | Status: DC | PRN
  Administered 2024-03-14 (×2): 50 ug via INTRAVENOUS

## 2024-03-14 MED ORDER — BUPIVACAINE LIPOSOME 1.3 % IJ SUSP
INTRAMUSCULAR | Status: AC
  Filled 2024-03-14: qty 20

## 2024-03-14 MED ORDER — PROPOFOL 10 MG/ML IV BOLUS
INTRAVENOUS | Status: AC
  Filled 2024-03-14: qty 40

## 2024-03-14 MED ORDER — CHLORHEXIDINE GLUCONATE CLOTH 2 % EX PADS: 6.0000 | MEDICATED_PAD | Freq: Once | CUTANEOUS | Status: DC

## 2024-03-14 MED ORDER — DROPERIDOL 2.5 MG/ML IJ SOLN: 0.6250 mg | Freq: Once | INTRAMUSCULAR | Status: DC | PRN

## 2024-03-14 MED ORDER — ACETAMINOPHEN 10 MG/ML IV SOLN
INTRAVENOUS | Status: DC | PRN
  Administered 2024-03-14: 1000 mg via INTRAVENOUS

## 2024-03-14 MED ORDER — OXYCODONE HCL 5 MG/5ML PO SOLN: 5.0000 mg | Freq: Once | ORAL | Status: DC | PRN

## 2024-03-14 MED ORDER — PHENYLEPHRINE 80 MCG/ML (10ML) SYRINGE FOR IV PUSH (FOR BLOOD PRESSURE SUPPORT)
PREFILLED_SYRINGE | INTRAVENOUS | Status: DC | PRN
  Administered 2024-03-14 (×3): 80 ug via INTRAVENOUS
  Administered 2024-03-14: 160 ug via INTRAVENOUS
  Administered 2024-03-14 (×3): 80 ug via INTRAVENOUS

## 2024-03-14 MED ORDER — ROCURONIUM BROMIDE 10 MG/ML (PF) SYRINGE
PREFILLED_SYRINGE | INTRAVENOUS | Status: AC
  Filled 2024-03-14: qty 10

## 2024-03-14 MED ORDER — SODIUM CHLORIDE 0.9 % IV SOLN
INTRAVENOUS | Status: DC | PRN
  Administered 2024-03-14: 501 mL

## 2024-03-14 SURGICAL SUPPLY — 41 items
BAG DECANTER FOR FLEXI CONT (MISCELLANEOUS) ×1 IMPLANT
BLADE SURG SZ11 CARB STEEL (BLADE) ×1 IMPLANT
BRUSH SCRUB EZ 4% CHG (MISCELLANEOUS) ×1 IMPLANT
CHLORAPREP W/TINT 26 (MISCELLANEOUS) ×1 IMPLANT
CLAMP SUTURE YELLOW 5 PAIRS (MISCELLANEOUS) ×1 IMPLANT
CLEANSER WND VASHE 34 (WOUND CARE) IMPLANT
COVER PROBE FLX POLY STRL (MISCELLANEOUS) IMPLANT
DERMABOND ADVANCED .7 DNX12 (GAUZE/BANDAGES/DRESSINGS) ×1 IMPLANT
DRESSING SURGICEL FIBRLLR 1X2 (HEMOSTASIS) ×1 IMPLANT
ELECT CAUTERY BLADE 6.4 (BLADE) ×1 IMPLANT
ELECTRODE REM PT RTRN 9FT ADLT (ELECTROSURGICAL) ×1 IMPLANT
GLOVE BIO SURGEON STRL SZ7 (GLOVE) ×1 IMPLANT
GLOVE SURG SYN 8.0 PF PI (GLOVE) ×1 IMPLANT
GOWN STRL REUS W/ TWL LRG LVL3 (GOWN DISPOSABLE) ×2 IMPLANT
GOWN STRL REUS W/ TWL XL LVL3 (GOWN DISPOSABLE) ×1 IMPLANT
GRAFT PROPATEN STD WALL 4 7X45 (Vascular Products) IMPLANT
IV NS 500ML BAXH (IV SOLUTION) ×1 IMPLANT
KIT TURNOVER KIT A (KITS) ×1 IMPLANT
LABEL OR SOLS (LABEL) ×1 IMPLANT
LOOP VESSEL MAXI 1X406 RED (MISCELLANEOUS) ×1 IMPLANT
LOOP VESSEL MINI 0.8X406 BLUE (MISCELLANEOUS) ×2 IMPLANT
MANIFOLD NEPTUNE II (INSTRUMENTS) ×1 IMPLANT
NDL FILTER BLUNT 18X1 1/2 (NEEDLE) ×1 IMPLANT
NEEDLE FILTER BLUNT 18X1 1/2 (NEEDLE) ×1 IMPLANT
NS IRRIG 500ML POUR BTL (IV SOLUTION) ×1 IMPLANT
PACK EXTREMITY ARMC (MISCELLANEOUS) ×1 IMPLANT
PAD PREP OB/GYN DISP 24X41 (PERSONAL CARE ITEMS) ×1 IMPLANT
PENCIL SMOKE EVACUATOR (MISCELLANEOUS) ×1 IMPLANT
STOCKINETTE 48X4 2 PLY STRL (GAUZE/BANDAGES/DRESSINGS) ×1 IMPLANT
STOCKINETTE STRL 4IN 9604848 (GAUZE/BANDAGES/DRESSINGS) ×1 IMPLANT
SUT MNCRL+ 5-0 UNDYED PC-3 (SUTURE) ×1 IMPLANT
SUT PROLENE 6 0 BV (SUTURE) ×2 IMPLANT
SUT SILK 2 0 SH (SUTURE) ×1 IMPLANT
SUT SILK 2-0 18XBRD TIE 12 (SUTURE) ×1 IMPLANT
SUT SILK 3-0 18XBRD TIE 12 (SUTURE) ×1 IMPLANT
SUT VIC AB 3-0 SH 27X BRD (SUTURE) ×2 IMPLANT
SUTURE GTX CV-6 30 TTC13 3/8CR (SUTURE) ×2 IMPLANT
SYR 20ML LL LF (SYRINGE) ×1 IMPLANT
SYR 3ML LL SCALE MARK (SYRINGE) ×1 IMPLANT
TRAP FLUID SMOKE EVACUATOR (MISCELLANEOUS) ×1 IMPLANT
WATER STERILE IRR 500ML POUR (IV SOLUTION) ×1 IMPLANT

## 2024-03-14 NOTE — Anesthesia Preprocedure Evaluation (Signed)
 Anesthesia Evaluation  Patient identified by MRN, date of birth, ID band Patient awake    Reviewed: Allergy & Precautions, H&P , NPO status , Patient's Chart, lab work & pertinent test results, reviewed documented beta blocker date and time   Airway Mallampati: II  TM Distance: >3 FB Neck ROM: full    Dental  (+) Teeth Intact   Pulmonary shortness of breath, sleep apnea and Continuous Positive Airway Pressure Ventilation , COPD, former smoker   Pulmonary exam normal        Cardiovascular Exercise Tolerance: Poor hypertension, On Medications + Peripheral Vascular Disease  Normal cardiovascular exam+ dysrhythmias  Rhythm:regular Rate:Normal     Neuro/Psych negative neurological ROS  negative psych ROS   GI/Hepatic negative GI ROS, Neg liver ROS,,,  Endo/Other  diabetes, Poorly Controlled  Class 4 obesity  Renal/GU DialysisRenal disease  negative genitourinary   Musculoskeletal   Abdominal   Peds  Hematology  (+) Blood dyscrasia, anemia   Anesthesia Other Findings Past Medical History: No date: Anemia No date: Chronic kidney disease     Comment:  14% FUNCTION No date: Diabetes mellitus without complication (HCC) No date: Diabetic retinopathy (HCC) No date: Dyspnea     Comment:  DOE No date: Dysrhythmia No date: End stage renal disease (HCC) No date: History of orthopnea     Comment:  error wrong patietn No date: Hypercholesteremia     Comment:  error No date: Hyperkalemia     Comment:  error wrong patient No date: Hypertension No date: Long-term insulin  use (HCC) No date: Lymphedema of leg No date: Metabolic encephalopathy     Comment:  error No date: Microalbuminuria No date: MSSA (methicillin susceptible Staphylococcus aureus) No date: Obesity No date: PVD (peripheral vascular disease) (HCC) No date: Respiratory failure (HCC)     Comment:  Error No date: Sepsis (HCC)     Comment:  error No date:  Sleep apnea     Comment:  no CPAP No date: UTI (urinary tract infection)     Comment:  error No date: Venous ulcer (HCC) No date: Vitamin D deficiency Past Surgical History: 10/24/2018: A/V FISTULAGRAM; Right     Comment:  Procedure: A/V FISTULAGRAM;  Surgeon: Jama Cordella MATSU, MD;  Location: [facility];  Service:               Cardiovascular;  Laterality: Right; 11/24/2018: A/V FISTULAGRAM; Right     Comment:  Procedure: A/V FISTULAGRAM;  Surgeon: Jama Cordella MATSU, MD;  Location: [facility];  Service:               Cardiovascular;  Laterality: Right; 07/31/2019: A/V FISTULAGRAM; Left     Comment:  Procedure: A/V FISTULAGRAM;  Surgeon: Jama Cordella MATSU, MD;  Location: [facility];  Service:               Cardiovascular;  Laterality: Left; 12/11/2019: A/V FISTULAGRAM; Left     Comment:  Procedure: A/V FISTULAGRAM;  Surgeon: Jama Cordella MATSU, MD;  Location: [facility];  Service:               Cardiovascular;  Laterality: Left;  07/08/2023: A/V FISTULAGRAM; Left     Comment:  Procedure: A/V Fistulagram;  Surgeon: Jama Cordella MATSU, MD;  Location: [facility];  Service:               Cardiovascular;  Laterality: Left; 09/16/2023: A/V FISTULAGRAM; Left     Comment:  Procedure: A/V Fistulagram;  Surgeon: Jama Cordella MATSU, MD;  Location: [facility];  Service:               Cardiovascular;  Laterality: Left; 12/15/2023: A/V FISTULAGRAM; Left     Comment:  Procedure: A/V Fistulagram;  Surgeon: Marea Selinda RAMAN, MD;               Location: [facility];  Service: Cardiovascular;              Laterality: Left; 02/21/2019: A/V SHUNT INTERVENTION; Right     Comment:  Procedure: A/V SHUNT INTERVENTION;  Surgeon: Jama Cordella MATSU, MD;  Location: [facility];  Service:              Cardiovascular;  Laterality:  Right; 10/03/2017: APPLICATION OF WOUND VAC; Left     Comment:  Procedure: APPLICATION OF WOUND VAC;  Surgeon: Marea Selinda RAMAN, MD;  Location: [facility];  Service: General;                Laterality: Left; 10/11/2017: APPLICATION OF WOUND VAC; Left     Comment:  Procedure: APPLICATION OF WOUND VAC;  Surgeon: Jama Cordella MATSU, MD;  Location: [facility];  Service: Vascular;                Laterality: Left; 10/14/2017: APPLICATION OF WOUND VAC; Left     Comment:  Procedure: WOUND VAC CHANGE;  Surgeon: Jama Cordella MATSU, MD;  Location: [facility];  Service: Vascular;                Laterality: Left;  left lower leg 10/18/2017: APPLICATION OF WOUND VAC; Left     Comment:  Procedure: WOUND VAC CHANGE;  Surgeon: Jama Cordella MATSU, MD;  Location: [facility];  Service: Vascular;                Laterality: Left; 10/07/2017: APPLICATION OF WOUND VAC; Left     Comment:  Procedure: APPLICATION OF WOUND VAC;  Surgeon: Jama Cordella MATSU, MD;  Location: [facility];  Service: Vascular;                Laterality: Left; 11/17/2022: APPLICATION OF WOUND VAC; Left     Comment:  Procedure: Wound vac exchange;  Surgeon: Jama Cordella MATSU, MD;  Location: [facility];  Service: Vascular;  Laterality: Left; 11/10/2022: APPLICATION OF WOUND VAC; Left     Comment:  Procedure: APPLICATION OF WOUND VAC;  Surgeon: Jama Cordella MATSU, MD;  Location: [facility];  Service: Vascular;                Laterality: Left; 11/24/2022: APPLICATION OF WOUND VAC; Left     Comment:  Procedure: WOUND VAC REMOVAL;  Surgeon: Jama Cordella MATSU, MD;  Location: [facility];  Service: Vascular;                Laterality: Left; 11/12/2022: APPLICATION OF WOUND VAC; Left     Comment:  Procedure: APPLICATION OF WOUND VAC;  Surgeon: Jama Cordella MATSU, MD;  Location: [facility];  Service: Vascular;                Laterality:  Left; 01/06/2023: APPLICATION OF WOUND VAC; Left     Comment:  Procedure: APPLICATION OF WOUND VAC;  Surgeon: Jama Cordella MATSU, MD;  Location: [facility];  Service: Vascular;                Laterality: Left; 07/19/2018: AV FISTULA INSERTION W/ RF MAGNETIC GUIDANCE; Right     Comment:  Procedure: AV FISTULA INSERTION W/RF MAGNETIC GUIDANCE;               Surgeon: Jama Cordella MATSU, MD;  Location: [facility];  Service: Cardiovascular;  Laterality: Right; 05/11/2019: AV FISTULA PLACEMENT; Left     Comment:  Procedure: ARTERIOVENOUS (AV) FISTULA CREATION               (RADIOCEPHALIC );  Surgeon: Jama Cordella MATSU, MD;                Location: [facility];  Service: Vascular;  Laterality:               Left; 02/20/2020: AV FISTULA PLACEMENT; Left     Comment:  Procedure: INSERTION OF ARTERIOVENOUS (AV) GORE-TEX               GRAFT LEFT ARM ( FOREARM LOOP );  Surgeon: Jama Cordella MATSU, MD;  Location: [facility];  Service: Vascular;                Laterality: Left; 11/01/2018: CATARACT EXTRACTION W/PHACO; Left     Comment:  Procedure: CATARACT EXTRACTION PHACO AND INTRAOCULAR               LENS PLACEMENT (IOC)-LEFT, DIABETIC-INSULIN  DEPENDENT;                Surgeon: Myrna Adine Anes, MD;  Location: [facility];                Service: Ophthalmology;  Laterality: Left;  US                00:41.8 CDE 4.61 Fluid Pack Lot # [id number] 04/27/2019: CATARACT EXTRACTION W/PHACO; Right     Comment:  Procedure: CATARACT EXTRACTION PHACO AND INTRAOCULAR  LENS PLACEMENT (IOC);  Surgeon: Myrna Adine Anes, MD;                Location: [facility];  Service: Ophthalmology;  Laterality:              Right;  US  00:34 CDE 1.97 FLUID PACK LOT # [id number]  05/12/2020: COLONOSCOPY WITH PROPOFOL ; N/A     Comment:  Procedure: COLONOSCOPY WITH PROPOFOL ;  Surgeon: [place],               Ladell POUR, MD;  Location: [facility];  Service:                Gastroenterology;  Laterality: N/A; 02/23/2019: DIALYSIS/PERMA CATHETER INSERTION; N/A     Comment:  Procedure: DIALYSIS/PERMA CATHETER INSERTION;  Surgeon:               Marea Selinda RAMAN, MD;  Location: [facility];                Service: Cardiovascular;  Laterality: N/A; 12/15/2023: DIALYSIS/PERMA CATHETER INSERTION; Right     Comment:  Procedure: DIALYSIS/PERMA CATHETER INSERTION;  Surgeon:               Marea Selinda RAMAN, MD;  Location: [facility];                Service: Cardiovascular;  Laterality: Right; 04/03/2020: DIALYSIS/PERMA CATHETER REMOVAL; N/A     Comment:  Procedure: DIALYSIS/PERMA CATHETER REMOVAL;  Surgeon:               Jama Cordella MATSU, MD;  Location: [facility];               Service: Cardiovascular;  Laterality: N/A; 02/03/2022: DIALYSIS/PERMA CATHETER REMOVAL; N/A     Comment:  Procedure: DIALYSIS/PERMA CATHETER REMOVAL;  Surgeon:               Jama Cordella MATSU, MD;  Location: [facility];               Service: Cardiovascular;  Laterality: N/A; 10/11/2017: I & D EXTREMITY; Left     Comment:  Procedure: IRRIGATION AND DEBRIDEMENT EXTREMITY;                Surgeon: Jama Cordella MATSU, MD;  Location: [facility];                Service: Vascular;  Laterality: Left; 10/07/2017: INCISION AND DRAINAGE ABSCESS; Right     Comment:  Procedure: INCISION AND DRAINAGE ABSCESS;  Surgeon:               Jama Cordella MATSU, MD;  Location: [facility];  Service:               Vascular;  Laterality: Right; 11/17/2022: INCISION AND DRAINAGE OF WOUND; Left     Comment:  Procedure: DEBRIDEMENT WOUND;  Surgeon: Jama Cordella MATSU, MD;  Location: [facility];  Service: Vascular;                Laterality: Left; 11/24/2022: INCISION AND DRAINAGE OF WOUND; Left     Comment:  Procedure: DEBRIDEMENT WOUND LEFT LOWER EXTREMITY;                Surgeon: Jama Cordella MATSU, MD;  Location: [facility];                Service: Vascular;  Laterality: Left; 10/03/2017:  IRRIGATION AND DEBRIDEMENT ABSCESS; Left     Comment:  Procedure: IRRIGATION AND DEBRIDEMENT ABSCESS with               debridement of skin, soft tissue, muscle 50sq cm;                Surgeon: Marea Selinda RAMAN, MD;  Location: [facility];  Service:              General;  Laterality: Left; 10/15/2022: LOWER EXTREMITY ANGIOGRAPHY; Left     Comment:  Procedure: Lower Extremity Angiography;  Surgeon:               Jama Cordella MATSU, MD;  Location: [facility];               Service: Cardiovascular;  Laterality: Left; 03/29/2023: TEE WITHOUT CARDIOVERSION; N/A     Comment:  Procedure: TRANSESOPHAGEAL ECHOCARDIOGRAM;  Surgeon:               Perla Evalene PARAS, MD;  Location: [facility];  Service:               Cardiovascular;  Laterality: N/A; 02/20/2019: TEMPORARY DIALYSIS CATHETER     Comment:  Procedure: TEMPORARY DIALYSIS CATHETER;  Surgeon:               Jama Cordella MATSU, MD;  Location: [facility];               Service: Cardiovascular;; 12/13/2023: TEMPORARY DIALYSIS CATHETER; N/A     Comment:  Procedure: TEMPORARY DIALYSIS CATHETER;  Surgeon:               Jama Cordella MATSU, MD;  Location: [facility];               Service: Cardiovascular;  Laterality: N/A; 09/18/2019: UPPER EXTREMITY ANGIOGRAPHY; Left     Comment:  Procedure: UPPER EXTREMITY ANGIOGRAPHY;  Surgeon:               Jama Cordella MATSU, MD;  Location: [facility];               Service: Cardiovascular;  Laterality: Left; 11/10/2022: WOUND DEBRIDEMENT; Left     Comment:  Procedure: DEBRIDEMENT WOUND;  Surgeon: Jama Cordella MATSU, MD;  Location: [facility];  Service: Vascular;                Laterality: Left; 11/12/2022: WOUND DEBRIDEMENT; Left     Comment:  Procedure: DEBRIDEMENT WOUND;  Surgeon: Jama Cordella MATSU, MD;  Location: [facility];  Service: Vascular;                Laterality: Left; 01/06/2023: WOUND DEBRIDEMENT; Left     Comment:  Procedure: DEBRIDEMENT WOUND;   Surgeon: Jama Cordella MATSU, MD;  Location: [facility];  Service: Vascular;                Laterality: Left; BMI    Body Mass Index: 48.14 kg/m     Reproductive/Obstetrics negative OB ROS                              Anesthesia Physical Anesthesia Plan  ASA: 3  Anesthesia Plan: General ETT   Post-op Pain Management:    Induction:   PONV Risk Score and Plan:   Airway Management Planned:   Additional Equipment:   Intra-op Plan:   Post-operative Plan:   Informed Consent: I have reviewed the patients History and Physical, chart, labs and discussed the procedure including the risks, benefits and alternatives for the proposed anesthesia with the patient or authorized representative who has indicated his/her understanding and acceptance.     Dental Advisory Given  Plan Discussed with: CRNA  Anesthesia Plan Comments:         Anesthesia Quick Evaluation

## 2024-03-14 NOTE — Anesthesia Postprocedure Evaluation (Signed)
 Anesthesia Post Note  Patient: Arsalan A Schey  Procedure(s) Performed: INSERTION, GRAFT, ARTERIOVENOUS, UPPER EXTREMITY (Right)  Patient location during evaluation: PACU Anesthesia Type: General Level of consciousness: awake and alert Pain management: pain level controlled Vital Signs Assessment: post-procedure vital signs reviewed and stable Respiratory status: spontaneous breathing, nonlabored ventilation, respiratory function stable and patient connected to nasal cannula oxygen Cardiovascular status: blood pressure returned to baseline and stable Postop Assessment: no apparent nausea or vomiting Anesthetic complications: no   There were no known notable events for this encounter.   Last Vitals:  Vitals:   03/14/24 1800 03/14/24 1822  BP: 139/66 (!) 126/58  Pulse: 74 74  Resp: 16 20  Temp:  (!) 36.2 C  SpO2: 97% 99%    Last Pain:  Vitals:   03/14/24 1822  TempSrc: Temporal  PainSc: 0-No pain                 [place] KANDICE Clause

## 2024-03-14 NOTE — H&P (View-Only) (Signed)
 MRN : [id number]  [name] is a 53 y.o. ([birth year]) male who presents with chief complaint of check access.  History of Present Illness:   Patient presents to [facility] for creation of a right arm AV access.  He was last seen in the office January 30, 2024.  The patient has a history of multiple failed accesses.  There have been accesses in both arms which are nonfunctioning.     Current access is via a catheter which is functioning poorly the flow rates have been less than ideal.  There have not been multiple episodes of catheter infection.  The patient denies fever and chills while on dialysis.  No tenderness or drainage at the exit site.   No recent shortening of the patient's walking distance or new symptoms consistent with claudication.  No history of rest pain symptoms. No new ulcers or wounds of the lower extremities have occurred.   The patient denies amaurosis fugax or recent TIA symptoms. There are no recent neurological changes noted. There is no history of DVT, PE or superficial thrombophlebitis. No recent episodes of angina or shortness of breath documented.    Vein mapping right arm demonstrates the cephalic vein is too small for fistula creation.  The basilic vein is marginal but becomes quite small at the antecubital fossa and therefore we would not be able to extend distally to gain length.   Arterial study right upper extremity demonstrates triphasic brachial triphasic ulnar and triphasic radial artery signals  Current Meds  Medication Sig   calcium  acetate (PHOSLO ) 667 MG capsule Take 1,334-2,001 mg by mouth See admin instructions. Take 2001 mg by mouth with each meal & take 1334 mg by mouth with each snack.   cinacalcet  (SENSIPAR ) 30 MG tablet Take 60 mg by mouth daily.   Insulin  Aspart FlexPen (NOVOLOG ) 100 UNIT/ML Inject 5 Units subcutaneously 3 (three) times daily with meals SKIP dose if your sugar before your meal is  your sugar is below 100.   insulin  degludec (TRESIBA) 100 UNIT/ML FlexTouch Pen Inject 30 Units subcutaneously once daily   Multiple Vitamins-Minerals (EYE HEALTH PO) Take 1 capsule by mouth daily.     Past Medical History:  Diagnosis Date   Anemia    Chronic kidney disease    14% FUNCTION   Diabetes mellitus without complication (HCC)    Diabetic retinopathy (HCC)    Dyspnea    DOE   Dysrhythmia    End stage renal disease (HCC)    History of orthopnea    error wrong patietn   Hypercholesteremia    error   Hyperkalemia    error wrong patient   Hypertension    Long-term insulin  use (HCC)    Lymphedema of leg    Metabolic encephalopathy    error   Microalbuminuria    MSSA (methicillin susceptible Staphylococcus aureus)    Obesity    PVD (peripheral vascular disease) (HCC)    Respiratory failure (HCC)    Error   Sepsis (HCC)    error   Sleep apnea    no CPAP   UTI (urinary tract infection)    error   Venous ulcer (HCC)    Vitamin D deficiency     Past Surgical History:  Procedure Laterality Date   A/V FISTULAGRAM Right 10/24/2018   Procedure: A/V FISTULAGRAM;  Surgeon: Jama Cordella MATSU, MD;  Location: [facility];  Service: Cardiovascular;  Laterality: Right;   A/V FISTULAGRAM Right 11/24/2018   Procedure: A/V FISTULAGRAM;  Surgeon: Jama Cordella MATSU, MD;  Location: [facility];  Service: Cardiovascular;  Laterality: Right;   A/V FISTULAGRAM Left 07/31/2019   Procedure: A/V FISTULAGRAM;  Surgeon: Jama Cordella MATSU, MD;  Location: [facility];  Service: Cardiovascular;  Laterality: Left;   A/V FISTULAGRAM Left 12/11/2019   Procedure: A/V FISTULAGRAM;  Surgeon: Jama Cordella MATSU, MD;  Location: [facility];  Service: Cardiovascular;  Laterality: Left;   A/V FISTULAGRAM Left 07/08/2023   Procedure: A/V Fistulagram;  Surgeon: Jama Cordella MATSU, MD;  Location: [facility];  Service: Cardiovascular;  Laterality: Left;   A/V  FISTULAGRAM Left 09/16/2023   Procedure: A/V Fistulagram;  Surgeon: Jama Cordella MATSU, MD;  Location: [facility];  Service: Cardiovascular;  Laterality: Left;   A/V FISTULAGRAM Left 12/15/2023   Procedure: A/V Fistulagram;  Surgeon: Marea Selinda RAMAN, MD;  Location: [facility];  Service: Cardiovascular;  Laterality: Left;   A/V SHUNT INTERVENTION Right 02/21/2019   Procedure: A/V SHUNT INTERVENTION;  Surgeon: Jama Cordella MATSU, MD;  Location: [facility];  Service: Cardiovascular;  Laterality: Right;   APPLICATION OF WOUND VAC Left 10/03/2017   Procedure: APPLICATION OF WOUND VAC;  Surgeon: Marea Selinda RAMAN, MD;  Location: [facility];  Service: General;  Laterality: Left;   APPLICATION OF WOUND VAC Left 10/11/2017   Procedure: APPLICATION OF WOUND VAC;  Surgeon: Jama Cordella MATSU, MD;  Location: [facility];  Service: Vascular;  Laterality: Left;   APPLICATION OF WOUND VAC Left 10/14/2017   Procedure: WOUND VAC CHANGE;  Surgeon: Jama Cordella MATSU, MD;  Location: [facility];  Service: Vascular;  Laterality: Left;  left lower leg   APPLICATION OF WOUND VAC Left 10/18/2017   Procedure: WOUND VAC CHANGE;  Surgeon: Jama Cordella MATSU, MD;  Location: [facility];  Service: Vascular;  Laterality: Left;   APPLICATION OF WOUND VAC Left 10/07/2017   Procedure: APPLICATION OF WOUND VAC;  Surgeon: Jama Cordella MATSU, MD;  Location: [facility];  Service: Vascular;  Laterality: Left;   APPLICATION OF WOUND VAC Left 11/17/2022   Procedure: Wound vac exchange;  Surgeon: Jama Cordella MATSU, MD;  Location: [facility];  Service: Vascular;  Laterality: Left;   APPLICATION OF WOUND VAC Left 11/10/2022   Procedure: APPLICATION OF WOUND VAC;  Surgeon: Jama Cordella MATSU, MD;  Location: [facility];  Service: Vascular;  Laterality: Left;   APPLICATION OF WOUND VAC Left 11/24/2022   Procedure: WOUND VAC REMOVAL;  Surgeon: Jama Cordella MATSU, MD;  Location: [facility];  Service: Vascular;  Laterality: Left;   APPLICATION OF  WOUND VAC Left 11/12/2022   Procedure: APPLICATION OF WOUND VAC;  Surgeon: Jama Cordella MATSU, MD;  Location: [facility];  Service: Vascular;  Laterality: Left;   APPLICATION OF WOUND VAC Left 01/06/2023   Procedure: APPLICATION OF WOUND VAC;  Surgeon: Jama Cordella MATSU, MD;  Location: [facility];  Service: Vascular;  Laterality: Left;   AV FISTULA INSERTION W/ RF MAGNETIC GUIDANCE Right 07/19/2018   Procedure: AV FISTULA INSERTION W/RF MAGNETIC GUIDANCE;  Surgeon: Jama Cordella MATSU, MD;  Location: [facility];  Service: Cardiovascular;  Laterality: Right;   AV FISTULA PLACEMENT Left 05/11/2019   Procedure: ARTERIOVENOUS (AV) FISTULA CREATION (RADIOCEPHALIC );  Surgeon: Jama Cordella MATSU, MD;  Location: [facility];  Service: Vascular;  Laterality: Left;  AV FISTULA PLACEMENT Left 02/20/2020   Procedure: INSERTION OF ARTERIOVENOUS (AV) GORE-TEX GRAFT LEFT ARM ( FOREARM LOOP );  Surgeon: Jama Cordella MATSU, MD;  Location: [facility];  Service: Vascular;  Laterality: Left;   CATARACT EXTRACTION W/PHACO Left 11/01/2018   Procedure: CATARACT EXTRACTION PHACO AND INTRAOCULAR LENS PLACEMENT (IOC)-LEFT, DIABETIC-INSULIN  DEPENDENT;  Surgeon: Myrna Adine Anes, MD;  Location: [facility];  Service: Ophthalmology;  Laterality: Left;  US  00:41.8 CDE 4.61 Fluid Pack Lot # [id number]   CATARACT EXTRACTION W/PHACO Right 04/27/2019   Procedure: CATARACT EXTRACTION PHACO AND INTRAOCULAR LENS PLACEMENT (IOC);  Surgeon: Myrna Adine Anes, MD;  Location: [facility];  Service: Ophthalmology;  Laterality: Right;  US  00:34 CDE 1.97 FLUID PACK LOT # [id number]    COLONOSCOPY WITH PROPOFOL  N/A 05/12/2020   Procedure: COLONOSCOPY WITH PROPOFOL ;  Surgeon: Tol[place], Ladell POUR, MD;  Location: [facility];  Service: Gastroenterology;  Laterality: N/A;   DIALYSIS/PERMA CATHETER INSERTION N/A 02/23/2019   Procedure: DIALYSIS/PERMA CATHETER INSERTION;  Surgeon: Marea Selinda RAMAN, MD;  Location: [facility];  Service: Cardiovascular;   Laterality: N/A;   DIALYSIS/PERMA CATHETER INSERTION Right 12/15/2023   Procedure: DIALYSIS/PERMA CATHETER INSERTION;  Surgeon: Marea Selinda RAMAN, MD;  Location: [facility];  Service: Cardiovascular;  Laterality: Right;   DIALYSIS/PERMA CATHETER REMOVAL N/A 04/03/2020   Procedure: DIALYSIS/PERMA CATHETER REMOVAL;  Surgeon: Jama Cordella MATSU, MD;  Location: [facility];  Service: Cardiovascular;  Laterality: N/A;   DIALYSIS/PERMA CATHETER REMOVAL N/A 02/03/2022   Procedure: DIALYSIS/PERMA CATHETER REMOVAL;  Surgeon: Jama Cordella MATSU, MD;  Location: [facility];  Service: Cardiovascular;  Laterality: N/A;   I & D EXTREMITY Left 10/11/2017   Procedure: IRRIGATION AND DEBRIDEMENT EXTREMITY;  Surgeon: Jama Cordella MATSU, MD;  Location: [facility];  Service: Vascular;  Laterality: Left;   INCISION AND DRAINAGE ABSCESS Right 10/07/2017   Procedure: INCISION AND DRAINAGE ABSCESS;  Surgeon: Jama Cordella MATSU, MD;  Location: [facility];  Service: Vascular;  Laterality: Right;   INCISION AND DRAINAGE OF WOUND Left 11/17/2022   Procedure: DEBRIDEMENT WOUND;  Surgeon: Jama Cordella MATSU, MD;  Location: [facility];  Service: Vascular;  Laterality: Left;   INCISION AND DRAINAGE OF WOUND Left 11/24/2022   Procedure: DEBRIDEMENT WOUND LEFT LOWER EXTREMITY;  Surgeon: Jama Cordella MATSU, MD;  Location: [facility];  Service: Vascular;  Laterality: Left;   IRRIGATION AND DEBRIDEMENT ABSCESS Left 10/03/2017   Procedure: IRRIGATION AND DEBRIDEMENT ABSCESS with debridement of skin, soft tissue, muscle 50sq cm;  Surgeon: Marea Selinda RAMAN, MD;  Location: [facility];  Service: General;  Laterality: Left;   LOWER EXTREMITY ANGIOGRAPHY Left 10/15/2022   Procedure: Lower Extremity Angiography;  Surgeon: Jama Cordella MATSU, MD;  Location: [facility];  Service: Cardiovascular;  Laterality: Left;   TEE WITHOUT CARDIOVERSION N/A 03/29/2023   Procedure: TRANSESOPHAGEAL ECHOCARDIOGRAM;  Surgeon: Perla Evalene PARAS, MD;   Location: [facility];  Service: Cardiovascular;  Laterality: N/A;   TEMPORARY DIALYSIS CATHETER  02/20/2019   Procedure: TEMPORARY DIALYSIS CATHETER;  Surgeon: Jama Cordella MATSU, MD;  Location: [facility];  Service: Cardiovascular;;   TEMPORARY DIALYSIS CATHETER N/A 12/13/2023   Procedure: TEMPORARY DIALYSIS CATHETER;  Surgeon: Jama Cordella MATSU, MD;  Location: [facility];  Service: Cardiovascular;  Laterality: N/A;   UPPER EXTREMITY ANGIOGRAPHY Left 09/18/2019   Procedure: UPPER EXTREMITY ANGIOGRAPHY;  Surgeon: Jama Cordella MATSU, MD;  Location: [facility];  Service: Cardiovascular;  Laterality: Left;   WOUND DEBRIDEMENT  Left 11/10/2022   Procedure: DEBRIDEMENT WOUND;  Surgeon: Jama Cordella MATSU, MD;  Location: [facility];  Service: Vascular;  Laterality: Left;   WOUND DEBRIDEMENT Left 11/12/2022   Procedure: DEBRIDEMENT WOUND;  Surgeon: Jama Cordella MATSU, MD;  Location: [facility];  Service: Vascular;  Laterality: Left;   WOUND DEBRIDEMENT Left 01/06/2023   Procedure: DEBRIDEMENT WOUND;  Surgeon: Jama Cordella MATSU, MD;  Location: [facility];  Service: Vascular;  Laterality: Left;    Social History Social History   Tobacco Use   Smoking status: Former    Types: Cigars    Passive exposure: Past   Smokeless tobacco: Former    Quit date: 09/30/2017   Tobacco comments:    occasional  Vaping Use   Vaping status: Never Used  Substance Use Topics   Alcohol  use: Yes    Alcohol /week: 1.0 standard drink of alcohol     Types: 1 Cans of beer per week    Comment: beer   Drug use: Not Currently    Family History Family History  Problem Relation Age of Onset   Diabetes Father    Kidney disease Father    Diabetes Daughter     Allergies  Allergen Reactions   Lisinopril Swelling    Lips mouth    Furosemide  Cough    Light headed, blurry vision, weakness.   Latex Itching   Silicone Itching   Adhesive [Tape] Itching and Rash     REVIEW OF SYSTEMS (Negative unless  checked)  Constitutional: [] Weight loss  [] Fever  [] Chills Cardiac: [] Chest pain   [] Chest pressure   [] Palpitations   [] Shortness of breath when laying flat   [] Shortness of breath with exertion. Vascular:  [] Pain in legs with walking   [] Pain in legs at rest  [] History of DVT   [] Phlebitis   [] Swelling in legs   [] Varicose veins   [] Non-healing ulcers Pulmonary:   [] Uses home oxygen   [] Productive cough   [] Hemoptysis   [] Wheeze  [] COPD   [] Asthma Neurologic:  [] Dizziness   [] Seizures   [] History of stroke   [] History of TIA  [] Aphasia   [] Vissual changes   [] Weakness or numbness in arm   [] Weakness or numbness in leg Musculoskeletal:   [] Joint swelling   [] Joint pain   [] Low back pain Hematologic:  [] Easy bruising  [] Easy bleeding   [] Hypercoagulable state   [] Anemic Gastrointestinal:  [] Diarrhea   [] Vomiting  [] Gastroesophageal reflux/heartburn   [] Difficulty swallowing. Genitourinary:  [x] Chronic kidney disease   [] Difficult urination  [] Frequent urination   [] Blood in urine Skin:  [] Rashes   [] Ulcers  Psychological:  [] History of anxiety   []  History of major depression.  Physical Examination  Vitals:   03/14/24 1145  BP: 130/66  Pulse: 88  Resp: 16  Temp: 97.7 F (36.5 C)  TempSrc: Temporal  SpO2: 98%  Weight: (!) 179.4 kg  Height: 6' 4 (1.93 m)   Body mass index is 48.14 kg/m. Gen: WD/WN, NAD Head: [place]/AT, No temporalis wasting.  Ear/Nose/Throat: Hearing grossly intact, nares w/o erythema or drainage Eyes: PER, EOMI, sclera nonicteric.  Neck: Supple, no gross masses or lesions.  No JVD.  Pulmonary:  Good air movement, no audible wheezing, no use of accessory muscles.  Cardiac: RRR, precordium non-hyperdynamic. Vascular:   Left forearm AV access no thrill no bruit Vessel Right Left  Radial Palpable Palpable  Brachial Palpable Palpable  Gastrointestinal: soft, non-distended. No guarding/no peritoneal signs.  Musculoskeletal: M/S 5/5 throughout.  No deformity.   Neurologic: CN  2-12 intact. Pain and light touch intact in extremities.  Symmetrical.  Speech is fluent. Motor exam as listed above. Psychiatric: Judgment intact, Mood & affect appropriate for pt's clinical situation. Dermatologic: No rashes or ulcers noted.  No changes consistent with cellulitis.   CBC Lab Results  Component Value Date   WBC 6.1 02/15/2024   HGB 16.7 03/14/2024   HCT 49.0 03/14/2024   MCV 100.0 02/15/2024   PLT 168 02/15/2024    BMET    Component Value Date/Time   NA 139 03/14/2024 1125   K 5.2 (H) 03/14/2024 1125   CL 103 03/14/2024 1125   CO2 24 02/15/2024 0834   GLUCOSE 89 03/14/2024 1125   BUN 50 (H) 03/14/2024 1125   CREATININE 14.60 (H) 03/14/2024 1125   CALCIUM  9.1 02/15/2024 0834   GFRNONAA 4 (L) 02/15/2024 0834   GFRAA 9 (L) 02/19/2020 1316   Estimated Creatinine Clearance: 10.1 mL/min (A) (by C-G formula based on SCr of 14.6 mg/dL (H)).  COAG Lab Results  Component Value Date   INR 1.2 12/12/2023   INR 1.0 03/24/2023   INR 1.1 11/12/2022    Radiology No results found.   Assessment/Plan 1. Complication of vascular access for dialysis, sequela (Primary) Recommend:   At this time the patient does not have appropriate extremity access for dialysis   Patient should have a right forearm loop graft created.   The risks, benefits and alternative therapies were reviewed in detail with the patient.  All questions were answered.  The patient agrees to proceed with surgery.    The patient will follow up with me in the office after the surgery.   2. End stage renal disease (HCC) Recommend:   At this time the patient does not have appropriate extremity access for dialysis   Patient should have a right forearm loop graft created.   The risks, benefits and alternative therapies were reviewed in detail with the patient.  All questions were answered.  The patient agrees to proceed with surgery.    The patient will follow up with me in the  office after the surgery.   3. Type 1 diabetes mellitus with other circulatory complication (HCC) Continue hypoglycemic medications as already ordered, these medications have been reviewed and there are no changes at this time.   Hgb A1C to be monitored as already arranged by primary service   4. Chronic obstructive pulmonary disease, unspecified COPD type (HCC) Continue pulmonary medications and aerosols as already ordered, these medications have been reviewed and there are no changes at this time.     5. Primary hypertension Continue antihypertensive medications as already ordered, these medications have been reviewed and there are no changes at this time.     Cordella Shawl, MD  03/14/2024 2:09 PM

## 2024-03-14 NOTE — Interval H&P Note (Signed)
 History and Physical Interval Note:  03/14/2024 2:11 PM  [name]  has presented today for surgery, with the diagnosis of ESRD.  The various methods of treatment have been discussed with the patient and family. After consideration of risks, benefits and other options for treatment, the patient has consented to  Procedure(s): INSERTION, GRAFT, ARTERIOVENOUS, UPPER EXTREMITY (Right) as a surgical intervention.  The patient's history has been reviewed, patient examined, no change in status, stable for surgery.  I have reviewed the patient's chart and labs.  Questions were answered to the patient's satisfaction.     Cordella Shawl

## 2024-03-14 NOTE — Op Note (Signed)
 OPERATIVE NOTE   PROCEDURE: Right forearm loop arteriovenous graft with 4 to 7 mm tapered PTFE graft  PRE-OPERATIVE DIAGNOSIS: 1. ESRD   POST-OPERATIVE DIAGNOSIS: same as above  SURGEON: Cordella JUDITHANN Shawl, MD  ASSISTANT(S): None  ANESTHESIA: General By endotracheal intubation  ESTIMATED BLOOD LOSS: 20 cc  FINDING(S): none  SPECIMEN(S):  none  INDICATIONS:   [name] is a 52 y.o. male who  presents with ESRD and need for permanent dialysis access.  Risk, benefits, and alternatives to access surgery were discussed.  The difference between AV fistulas and AV grafts were discussed.  The patient is aware the risks include but are not limited to: bleeding, infection, steal syndrome, nerve damage, ischemic monomelic neuropathy, failure to mature, and need for additional procedures.  The patient is aware of the risks and elects to proceed forward.  DESCRIPTION: After full informed written consent was obtained from the patient, the patient was brought back to the operating room and placed supine upon the operating table.  The patientwas given IV antibiotics prior to proceeding.  After obtaining adequate general anesthesia, the patient was prepped and draped in standard fashion.    I made a transverse incision at the antecubital fossa and dissected down through the subcutaneous tissue and fascia.  The brachial artery was dissected proximally and distally and vessel loops placed for control.  The artery was patent and adequate sized for AV graft creation.  One of the brachial veins was in close proximity and was found to be patent and of adequate size for AV graft creation.  I dissected it out and placed a vessel loop around the vein, and would later control this with bulldog clamps.  I then obtained a 4 to 7 mm propatent graft, which I stretched to full length to help determine the apex of this looped forearm arteriovenous graft.  I made a transverse incision at the determined apex site  and dissected down out a pocket.  I then took a curved metal tunneler and dissected from the antecubital incision up to the apical incision.  I delivered the graft through the metal tunnel, taking care to maintain the orientation of the graft.  I then tunneled from the apex to the antecubital incision, leaving the metal tunnel in place and delivered the remainder of the graft through the tunnel.   I then gave the patient 3000 units of heparin  to gain some anticoagulation, and allowed this to circulate for several minutes. I placed the brachial artery under tension proximally and distally with vessel loops.  I made an arteriotomy with a 11 blade and extended it with a Potts scissor for.  The graft was then cut and beveled to match the arteriotomy.  The graft was sewn to the artery with a running CV-6 Goretex suture, in a end-to-side configuration.  Prior to completing the anastomosis, I allowed the artery to backbleed from both ends.  I then  released the vessel loops on the inflow and allowed the artery to decompress through the graft. There was good pulsatile bleeding through this graft.  I then clamped the graft near its arterial anastomosis.  I then suctioned out all the blood in the graft and instilled heparinized saline into the graft.   At this point, I pulled the graft to appropriate tension to remove any redundancy.  I then used the bulldog clamps to control the vein.  An anterior wall venotomy was then made with an 11 blade and extended with Potts scissors.  The graft was then cut an beveled to match the venotomy.  The venous anastomosis was created with a CV-6 suture. Prior to completing this anastomosis, I allowed the vein to back bleed and then I also allowed the artery to bleed in an antegrade fashion.  I completed this anastomosis in the usual fashion, and irrigated out the wound with sterile saline.  I released all clamps.  There was excellent flow in the graft, and a palpable pulse in the artery  beyond the graft. At this point, I washed out the antecubital wound.  I placed Surgicel and Evicel topical hemostatic agents and hemostasis was complete. The subcutaneous tissue was reapproximated in all incisions with a running stitch of 3-0 Vicryl.  The skin was then reapproximated in all incisions with a running subcuticular 4-0 Monocryl.  The skin was then cleaned and dried at all incisions, and Dermabond used to reinforce the skin closure.  The patient was taken to the recovery room in stable condition having tolerated the procedure well.  COMPLICATIONS: none  CONDITION: stable  Cordella Shawl  03/14/2024, 5:49 PM                  This note was created with Dragon Medical transcription system. Any errors in dictation are purely unintentional.

## 2024-03-14 NOTE — Anesthesia Procedure Notes (Signed)
 Procedure Name: Intubation Date/Time: 03/14/2024 2:30 PM  Performed by: Kearney Rosina SAILOR, RNPre-anesthesia Checklist: Patient identified, Patient being monitored, Timeout performed, Emergency Drugs available and Suction available Patient Re-evaluated:Patient Re-evaluated prior to induction Oxygen Delivery Method: Circle system utilized Preoxygenation: Pre-oxygenation with 100% oxygen Induction Type: IV induction Ventilation: Two handed mask ventilation required Laryngoscope Size: 4 and McGrath Grade View: Grade I Tube type: Oral Tube size: 7.0 mm Number of attempts: 1 Airway Equipment and Method: Stylet Placement Confirmation: ETT inserted through vocal cords under direct vision, positive ETCO2 and breath sounds checked- equal and bilateral Secured at: 24 cm Tube secured with: Tape Dental Injury: Teeth and Oropharynx as per pre-operative assessment  Comments: 2 hand BVM d/t pt size, atraumatic intubation, ramping used

## 2024-03-14 NOTE — Transfer of Care (Signed)
 Immediate Anesthesia Transfer of Care Note  Patient: [name]  Procedure(s) Performed: INSERTION, GRAFT, ARTERIOVENOUS, UPPER EXTREMITY (Right)  Patient Location: PACU  Anesthesia Type:General  Level of Consciousness: awake, alert , and oriented  Airway & Oxygen Therapy: Patient Spontanous Breathing and Patient connected to face mask oxygen  Post-op Assessment: Report given to RN and Post -op Vital signs reviewed and stable  Post vital signs: Reviewed and stable  Last Vitals:  Vitals Value Taken Time  BP 128/63 03/14/24 17:37  Temp    Pulse 75 03/14/24 17:40  Resp 17 03/14/24 17:40  SpO2 100 % 03/14/24 17:40  Vitals shown include unfiled device data.  Last Pain:  Vitals:   03/14/24 1145  TempSrc: Temporal  PainSc: 0-No pain         Complications: There were no known notable events for this encounter.

## 2024-03-14 NOTE — Progress Notes (Signed)
 MRN : [id number]  [name] is a 53 y.o. ([birth year]) male who presents with chief complaint of check access.  History of Present Illness:   Patient presents to [facility] for creation of a right arm AV access.  He was last seen in the office January 30, 2024.  The patient has a history of multiple failed accesses.  There have been accesses in both arms which are nonfunctioning.     Current access is via a catheter which is functioning poorly the flow rates have been less than ideal.  There have not been multiple episodes of catheter infection.  The patient denies fever and chills while on dialysis.  No tenderness or drainage at the exit site.   No recent shortening of the patient's walking distance or new symptoms consistent with claudication.  No history of rest pain symptoms. No new ulcers or wounds of the lower extremities have occurred.   The patient denies amaurosis fugax or recent TIA symptoms. There are no recent neurological changes noted. There is no history of DVT, PE or superficial thrombophlebitis. No recent episodes of angina or shortness of breath documented.    Vein mapping right arm demonstrates the cephalic vein is too small for fistula creation.  The basilic vein is marginal but becomes quite small at the antecubital fossa and therefore we would not be able to extend distally to gain length.   Arterial study right upper extremity demonstrates triphasic brachial triphasic ulnar and triphasic radial artery signals  Current Meds  Medication Sig   calcium  acetate (PHOSLO ) 667 MG capsule Take 1,334-2,001 mg by mouth See admin instructions. Take 2001 mg by mouth with each meal & take 1334 mg by mouth with each snack.   cinacalcet  (SENSIPAR ) 30 MG tablet Take 60 mg by mouth daily.   Insulin  Aspart FlexPen (NOVOLOG ) 100 UNIT/ML Inject 5 Units subcutaneously 3 (three) times daily with meals SKIP dose if your sugar before your meal is  your sugar is below 100.   insulin  degludec (TRESIBA) 100 UNIT/ML FlexTouch Pen Inject 30 Units subcutaneously once daily   Multiple Vitamins-Minerals (EYE HEALTH PO) Take 1 capsule by mouth daily.     Past Medical History:  Diagnosis Date   Anemia    Chronic kidney disease    14% FUNCTION   Diabetes mellitus without complication (HCC)    Diabetic retinopathy (HCC)    Dyspnea    DOE   Dysrhythmia    End stage renal disease (HCC)    History of orthopnea    error wrong patietn   Hypercholesteremia    error   Hyperkalemia    error wrong patient   Hypertension    Long-term insulin  use (HCC)    Lymphedema of leg    Metabolic encephalopathy    error   Microalbuminuria    MSSA (methicillin susceptible Staphylococcus aureus)    Obesity    PVD (peripheral vascular disease) (HCC)    Respiratory failure (HCC)    Error   Sepsis (HCC)    error   Sleep apnea    no CPAP   UTI (urinary tract infection)    error   Venous ulcer (HCC)    Vitamin D deficiency     Past Surgical History:  Procedure Laterality Date   A/V FISTULAGRAM Right 10/24/2018   Procedure: A/V FISTULAGRAM;  Surgeon: Jama Cordella MATSU, MD;  Location: [facility];  Service: Cardiovascular;  Laterality: Right;   A/V FISTULAGRAM Right 11/24/2018   Procedure: A/V FISTULAGRAM;  Surgeon: Jama Cordella MATSU, MD;  Location: [facility];  Service: Cardiovascular;  Laterality: Right;   A/V FISTULAGRAM Left 07/31/2019   Procedure: A/V FISTULAGRAM;  Surgeon: Jama Cordella MATSU, MD;  Location: [facility];  Service: Cardiovascular;  Laterality: Left;   A/V FISTULAGRAM Left 12/11/2019   Procedure: A/V FISTULAGRAM;  Surgeon: Jama Cordella MATSU, MD;  Location: [facility];  Service: Cardiovascular;  Laterality: Left;   A/V FISTULAGRAM Left 07/08/2023   Procedure: A/V Fistulagram;  Surgeon: Jama Cordella MATSU, MD;  Location: [facility];  Service: Cardiovascular;  Laterality: Left;   A/V  FISTULAGRAM Left 09/16/2023   Procedure: A/V Fistulagram;  Surgeon: Jama Cordella MATSU, MD;  Location: [facility];  Service: Cardiovascular;  Laterality: Left;   A/V FISTULAGRAM Left 12/15/2023   Procedure: A/V Fistulagram;  Surgeon: Marea Selinda RAMAN, MD;  Location: [facility];  Service: Cardiovascular;  Laterality: Left;   A/V SHUNT INTERVENTION Right 02/21/2019   Procedure: A/V SHUNT INTERVENTION;  Surgeon: Jama Cordella MATSU, MD;  Location: [facility];  Service: Cardiovascular;  Laterality: Right;   APPLICATION OF WOUND VAC Left 10/03/2017   Procedure: APPLICATION OF WOUND VAC;  Surgeon: Marea Selinda RAMAN, MD;  Location: [facility];  Service: General;  Laterality: Left;   APPLICATION OF WOUND VAC Left 10/11/2017   Procedure: APPLICATION OF WOUND VAC;  Surgeon: Jama Cordella MATSU, MD;  Location: [facility];  Service: Vascular;  Laterality: Left;   APPLICATION OF WOUND VAC Left 10/14/2017   Procedure: WOUND VAC CHANGE;  Surgeon: Jama Cordella MATSU, MD;  Location: [facility];  Service: Vascular;  Laterality: Left;  left lower leg   APPLICATION OF WOUND VAC Left 10/18/2017   Procedure: WOUND VAC CHANGE;  Surgeon: Jama Cordella MATSU, MD;  Location: [facility];  Service: Vascular;  Laterality: Left;   APPLICATION OF WOUND VAC Left 10/07/2017   Procedure: APPLICATION OF WOUND VAC;  Surgeon: Jama Cordella MATSU, MD;  Location: [facility];  Service: Vascular;  Laterality: Left;   APPLICATION OF WOUND VAC Left 11/17/2022   Procedure: Wound vac exchange;  Surgeon: Jama Cordella MATSU, MD;  Location: [facility];  Service: Vascular;  Laterality: Left;   APPLICATION OF WOUND VAC Left 11/10/2022   Procedure: APPLICATION OF WOUND VAC;  Surgeon: Jama Cordella MATSU, MD;  Location: [facility];  Service: Vascular;  Laterality: Left;   APPLICATION OF WOUND VAC Left 11/24/2022   Procedure: WOUND VAC REMOVAL;  Surgeon: Jama Cordella MATSU, MD;  Location: [facility];  Service: Vascular;  Laterality: Left;   APPLICATION OF  WOUND VAC Left 11/12/2022   Procedure: APPLICATION OF WOUND VAC;  Surgeon: Jama Cordella MATSU, MD;  Location: [facility];  Service: Vascular;  Laterality: Left;   APPLICATION OF WOUND VAC Left 01/06/2023   Procedure: APPLICATION OF WOUND VAC;  Surgeon: Jama Cordella MATSU, MD;  Location: [facility];  Service: Vascular;  Laterality: Left;   AV FISTULA INSERTION W/ RF MAGNETIC GUIDANCE Right 07/19/2018   Procedure: AV FISTULA INSERTION W/RF MAGNETIC GUIDANCE;  Surgeon: Jama Cordella MATSU, MD;  Location: [facility];  Service: Cardiovascular;  Laterality: Right;   AV FISTULA PLACEMENT Left 05/11/2019   Procedure: ARTERIOVENOUS (AV) FISTULA CREATION (RADIOCEPHALIC );  Surgeon: Jama Cordella MATSU, MD;  Location: [facility];  Service: Vascular;  Laterality: Left;  AV FISTULA PLACEMENT Left 02/20/2020   Procedure: INSERTION OF ARTERIOVENOUS (AV) GORE-TEX GRAFT LEFT ARM ( FOREARM LOOP );  Surgeon: Jama Cordella MATSU, MD;  Location: [facility];  Service: Vascular;  Laterality: Left;   CATARACT EXTRACTION W/PHACO Left 11/01/2018   Procedure: CATARACT EXTRACTION PHACO AND INTRAOCULAR LENS PLACEMENT (IOC)-LEFT, DIABETIC-INSULIN  DEPENDENT;  Surgeon: Myrna Adine Anes, MD;  Location: [facility];  Service: Ophthalmology;  Laterality: Left;  US  00:41.8 CDE 4.61 Fluid Pack Lot # [id number]   CATARACT EXTRACTION W/PHACO Right 04/27/2019   Procedure: CATARACT EXTRACTION PHACO AND INTRAOCULAR LENS PLACEMENT (IOC);  Surgeon: Myrna Adine Anes, MD;  Location: [facility];  Service: Ophthalmology;  Laterality: Right;  US  00:34 CDE 1.97 FLUID PACK LOT # [id number]    COLONOSCOPY WITH PROPOFOL  N/A 05/12/2020   Procedure: COLONOSCOPY WITH PROPOFOL ;  Surgeon: Toledo, Ladell POUR, MD;  Location: [facility];  Service: Gastroenterology;  Laterality: N/A;   DIALYSIS/PERMA CATHETER INSERTION N/A 02/23/2019   Procedure: DIALYSIS/PERMA CATHETER INSERTION;  Surgeon: Marea Selinda RAMAN, MD;  Location: [facility];  Service: Cardiovascular;   Laterality: N/A;   DIALYSIS/PERMA CATHETER INSERTION Right 12/15/2023   Procedure: DIALYSIS/PERMA CATHETER INSERTION;  Surgeon: Marea Selinda RAMAN, MD;  Location: [facility];  Service: Cardiovascular;  Laterality: Right;   DIALYSIS/PERMA CATHETER REMOVAL N/A 04/03/2020   Procedure: DIALYSIS/PERMA CATHETER REMOVAL;  Surgeon: Jama Cordella MATSU, MD;  Location: [facility];  Service: Cardiovascular;  Laterality: N/A;   DIALYSIS/PERMA CATHETER REMOVAL N/A 02/03/2022   Procedure: DIALYSIS/PERMA CATHETER REMOVAL;  Surgeon: Jama Cordella MATSU, MD;  Location: [facility];  Service: Cardiovascular;  Laterality: N/A;   I & D EXTREMITY Left 10/11/2017   Procedure: IRRIGATION AND DEBRIDEMENT EXTREMITY;  Surgeon: Jama Cordella MATSU, MD;  Location: [facility];  Service: Vascular;  Laterality: Left;   INCISION AND DRAINAGE ABSCESS Right 10/07/2017   Procedure: INCISION AND DRAINAGE ABSCESS;  Surgeon: Jama Cordella MATSU, MD;  Location: [facility];  Service: Vascular;  Laterality: Right;   INCISION AND DRAINAGE OF WOUND Left 11/17/2022   Procedure: DEBRIDEMENT WOUND;  Surgeon: Jama Cordella MATSU, MD;  Location: [facility];  Service: Vascular;  Laterality: Left;   INCISION AND DRAINAGE OF WOUND Left 11/24/2022   Procedure: DEBRIDEMENT WOUND LEFT LOWER EXTREMITY;  Surgeon: Jama Cordella MATSU, MD;  Location: [facility];  Service: Vascular;  Laterality: Left;   IRRIGATION AND DEBRIDEMENT ABSCESS Left 10/03/2017   Procedure: IRRIGATION AND DEBRIDEMENT ABSCESS with debridement of skin, soft tissue, muscle 50sq cm;  Surgeon: Marea Selinda RAMAN, MD;  Location: [facility];  Service: General;  Laterality: Left;   LOWER EXTREMITY ANGIOGRAPHY Left 10/15/2022   Procedure: Lower Extremity Angiography;  Surgeon: Jama Cordella MATSU, MD;  Location: [facility];  Service: Cardiovascular;  Laterality: Left;   TEE WITHOUT CARDIOVERSION N/A 03/29/2023   Procedure: TRANSESOPHAGEAL ECHOCARDIOGRAM;  Surgeon: Perla Evalene PARAS, MD;   Location: [facility];  Service: Cardiovascular;  Laterality: N/A;   TEMPORARY DIALYSIS CATHETER  02/20/2019   Procedure: TEMPORARY DIALYSIS CATHETER;  Surgeon: Jama Cordella MATSU, MD;  Location: [facility];  Service: Cardiovascular;;   TEMPORARY DIALYSIS CATHETER N/A 12/13/2023   Procedure: TEMPORARY DIALYSIS CATHETER;  Surgeon: Jama Cordella MATSU, MD;  Location: [facility];  Service: Cardiovascular;  Laterality: N/A;   UPPER EXTREMITY ANGIOGRAPHY Left 09/18/2019   Procedure: UPPER EXTREMITY ANGIOGRAPHY;  Surgeon: Jama Cordella MATSU, MD;  Location: [facility];  Service: Cardiovascular;  Laterality: Left;   WOUND DEBRIDEMENT  Left 11/10/2022   Procedure: DEBRIDEMENT WOUND;  Surgeon: Jama Cordella MATSU, MD;  Location: [facility];  Service: Vascular;  Laterality: Left;   WOUND DEBRIDEMENT Left 11/12/2022   Procedure: DEBRIDEMENT WOUND;  Surgeon: Jama Cordella MATSU, MD;  Location: [facility];  Service: Vascular;  Laterality: Left;   WOUND DEBRIDEMENT Left 01/06/2023   Procedure: DEBRIDEMENT WOUND;  Surgeon: Jama Cordella MATSU, MD;  Location: [facility];  Service: Vascular;  Laterality: Left;    Social History Social History   Tobacco Use   Smoking status: Former    Types: Cigars    Passive exposure: Past   Smokeless tobacco: Former    Quit date: 09/30/2017   Tobacco comments:    occasional  Vaping Use   Vaping status: Never Used  Substance Use Topics   Alcohol  use: Yes    Alcohol /week: 1.0 standard drink of alcohol     Types: 1 Cans of beer per week    Comment: beer   Drug use: Not Currently    Family History Family History  Problem Relation Age of Onset   Diabetes Father    Kidney disease Father    Diabetes Daughter     Allergies  Allergen Reactions   Lisinopril Swelling    Lips mouth    Furosemide  Cough    Light headed, blurry vision, weakness.   Latex Itching   Silicone Itching   Adhesive [Tape] Itching and Rash     REVIEW OF SYSTEMS (Negative unless  checked)  Constitutional: [] Weight loss  [] Fever  [] Chills Cardiac: [] Chest pain   [] Chest pressure   [] Palpitations   [] Shortness of breath when laying flat   [] Shortness of breath with exertion. Vascular:  [] Pain in legs with walking   [] Pain in legs at rest  [] History of DVT   [] Phlebitis   [] Swelling in legs   [] Varicose veins   [] Non-healing ulcers Pulmonary:   [] Uses home oxygen   [] Productive cough   [] Hemoptysis   [] Wheeze  [] COPD   [] Asthma Neurologic:  [] Dizziness   [] Seizures   [] History of stroke   [] History of TIA  [] Aphasia   [] Vissual changes   [] Weakness or numbness in arm   [] Weakness or numbness in leg Musculoskeletal:   [] Joint swelling   [] Joint pain   [] Low back pain Hematologic:  [] Easy bruising  [] Easy bleeding   [] Hypercoagulable state   [] Anemic Gastrointestinal:  [] Diarrhea   [] Vomiting  [] Gastroesophageal reflux/heartburn   [] Difficulty swallowing. Genitourinary:  [x] Chronic kidney disease   [] Difficult urination  [] Frequent urination   [] Blood in urine Skin:  [] Rashes   [] Ulcers  Psychological:  [] History of anxiety   []  History of major depression.  Physical Examination  Vitals:   03/14/24 1145  BP: 130/66  Pulse: 88  Resp: 16  Temp: 97.7 F (36.5 C)  TempSrc: Temporal  SpO2: 98%  Weight: (!) 179.4 kg  Height: 6' 4 (1.93 m)   Body mass index is 48.14 kg/m. Gen: WD/WN, NAD Head: [place]/AT, No temporalis wasting.  Ear/Nose/Throat: Hearing grossly intact, nares w/o erythema or drainage Eyes: PER, EOMI, sclera nonicteric.  Neck: Supple, no gross masses or lesions.  No JVD.  Pulmonary:  Good air movement, no audible wheezing, no use of accessory muscles.  Cardiac: RRR, precordium non-hyperdynamic. Vascular:   Left forearm AV access no thrill no bruit Vessel Right Left  Radial Palpable Palpable  Brachial Palpable Palpable  Gastrointestinal: soft, non-distended. No guarding/no peritoneal signs.  Musculoskeletal: M/S 5/5 throughout.  No deformity.   Neurologic: CN  2-12 intact. Pain and light touch intact in extremities.  Symmetrical.  Speech is fluent. Motor exam as listed above. Psychiatric: Judgment intact, Mood & affect appropriate for pt's clinical situation. Dermatologic: No rashes or ulcers noted.  No changes consistent with cellulitis.   CBC Lab Results  Component Value Date   WBC 6.1 02/15/2024   HGB 16.7 03/14/2024   HCT 49.0 03/14/2024   MCV 100.0 02/15/2024   PLT 168 02/15/2024    BMET    Component Value Date/Time   NA 139 03/14/2024 1125   K 5.2 (H) 03/14/2024 1125   CL 103 03/14/2024 1125   CO2 24 02/15/2024 0834   GLUCOSE 89 03/14/2024 1125   BUN 50 (H) 03/14/2024 1125   CREATININE 14.60 (H) 03/14/2024 1125   CALCIUM  9.1 02/15/2024 0834   GFRNONAA 4 (L) 02/15/2024 0834   GFRAA 9 (L) 02/19/2020 1316   Estimated Creatinine Clearance: 10.1 mL/min (A) (by C-G formula based on SCr of 14.6 mg/dL (H)).  COAG Lab Results  Component Value Date   INR 1.2 12/12/2023   INR 1.0 03/24/2023   INR 1.1 11/12/2022    Radiology No results found.   Assessment/Plan 1. Complication of vascular access for dialysis, sequela (Primary) Recommend:   At this time the patient does not have appropriate extremity access for dialysis   Patient should have a right forearm loop graft created.   The risks, benefits and alternative therapies were reviewed in detail with the patient.  All questions were answered.  The patient agrees to proceed with surgery.    The patient will follow up with me in the office after the surgery.   2. End stage renal disease (HCC) Recommend:   At this time the patient does not have appropriate extremity access for dialysis   Patient should have a right forearm loop graft created.   The risks, benefits and alternative therapies were reviewed in detail with the patient.  All questions were answered.  The patient agrees to proceed with surgery.    The patient will follow up with me in the  office after the surgery.   3. Type 1 diabetes mellitus with other circulatory complication (HCC) Continue hypoglycemic medications as already ordered, these medications have been reviewed and there are no changes at this time.   Hgb A1C to be monitored as already arranged by primary service   4. Chronic obstructive pulmonary disease, unspecified COPD type (HCC) Continue pulmonary medications and aerosols as already ordered, these medications have been reviewed and there are no changes at this time.     5. Primary hypertension Continue antihypertensive medications as already ordered, these medications have been reviewed and there are no changes at this time.     Cordella Shawl, MD  03/14/2024 2:09 PM

## 2024-03-15 ENCOUNTER — Telehealth (INDEPENDENT_AMBULATORY_CARE_PROVIDER_SITE_OTHER): Payer: Self-pay | Admitting: Vascular Surgery

## 2024-03-15 ENCOUNTER — Encounter: Payer: Self-pay | Admitting: Vascular Surgery

## 2024-03-15 ENCOUNTER — Telehealth: Payer: Self-pay

## 2024-03-15 ENCOUNTER — Encounter (INDEPENDENT_AMBULATORY_CARE_PROVIDER_SITE_OTHER): Payer: Self-pay | Admitting: Vascular Surgery

## 2024-03-15 DIAGNOSIS — T82848A Pain from vascular prosthetic devices, implants and grafts, initial encounter: Secondary | ICD-10-CM | POA: Diagnosis not present

## 2024-03-15 DIAGNOSIS — T82898A Other specified complication of vascular prosthetic devices, implants and grafts, initial encounter: Secondary | ICD-10-CM | POA: Diagnosis not present

## 2024-03-15 DIAGNOSIS — Z992 Dependence on renal dialysis: Secondary | ICD-10-CM | POA: Diagnosis not present

## 2024-03-15 DIAGNOSIS — N186 End stage renal disease: Secondary | ICD-10-CM | POA: Diagnosis not present

## 2024-03-15 NOTE — Telephone Encounter (Signed)
 Spoke with patient who stated he needs a work note. Procedure was yesterday. Patient states he need to be excused from work Tuesday, 03/13/24- Monday, 03/19/24. Patient states he works nights. Please advise if these dates are ok and I will type of a work note for the patient.

## 2024-03-15 NOTE — Progress Notes (Signed)
 Complex Care Management Care Guide Note  03/15/2024 Name: CARVIN ALMAS MRN: [id number] DOB: 1970-09-23  Deontae A Parmer is a 53 y.o. year old male who is a primary care patient of [name], Dale E, MD and is actively engaged with the care management team. I reached out to Edouard A Baptist by phone today to assist with re-scheduling  with the RN Case Manager.  Follow up plan: Unsuccessful telephone outreach attempt made. A HIPAA compliant phone message was left for the patient providing contact information and requesting a return call.  [name] [facility], [facility] Guide  Direct Dial : [phone]  Fax [phone]

## 2024-03-15 NOTE — Telephone Encounter (Signed)
These dates are fine.

## 2024-03-15 NOTE — Progress Notes (Signed)
 Complex Care Management Note Care Guide Note  03/15/2024 Name: [name] MRN: [id number] DOB: [birth year]   Complex Care Management Outreach Attempts: A second unsuccessful outreach was attempted today to offer the patient with information about available complex care management services.  Follow Up Plan:  Additional outreach attempts will be made to offer the patient complex care management information and services.   Encounter Outcome:  No Answer  [name] [facility], [facility] Guide  Direct Dial : [phone]  Fax [phone]

## 2024-03-17 DIAGNOSIS — T82898A Other specified complication of vascular prosthetic devices, implants and grafts, initial encounter: Secondary | ICD-10-CM | POA: Diagnosis not present

## 2024-03-17 DIAGNOSIS — Z992 Dependence on renal dialysis: Secondary | ICD-10-CM | POA: Diagnosis not present

## 2024-03-17 DIAGNOSIS — N186 End stage renal disease: Secondary | ICD-10-CM | POA: Diagnosis not present

## 2024-03-17 DIAGNOSIS — T82848A Pain from vascular prosthetic devices, implants and grafts, initial encounter: Secondary | ICD-10-CM | POA: Diagnosis not present

## 2024-03-20 DIAGNOSIS — T82898A Other specified complication of vascular prosthetic devices, implants and grafts, initial encounter: Secondary | ICD-10-CM | POA: Diagnosis not present

## 2024-03-20 DIAGNOSIS — Z992 Dependence on renal dialysis: Secondary | ICD-10-CM | POA: Diagnosis not present

## 2024-03-20 DIAGNOSIS — N186 End stage renal disease: Secondary | ICD-10-CM | POA: Diagnosis not present

## 2024-03-20 DIAGNOSIS — T82848A Pain from vascular prosthetic devices, implants and grafts, initial encounter: Secondary | ICD-10-CM | POA: Diagnosis not present

## 2024-03-22 DIAGNOSIS — T82898A Other specified complication of vascular prosthetic devices, implants and grafts, initial encounter: Secondary | ICD-10-CM | POA: Diagnosis not present

## 2024-03-22 DIAGNOSIS — N186 End stage renal disease: Secondary | ICD-10-CM | POA: Diagnosis not present

## 2024-03-22 DIAGNOSIS — T82848A Pain from vascular prosthetic devices, implants and grafts, initial encounter: Secondary | ICD-10-CM | POA: Diagnosis not present

## 2024-03-22 DIAGNOSIS — Z992 Dependence on renal dialysis: Secondary | ICD-10-CM | POA: Diagnosis not present

## 2024-03-24 DIAGNOSIS — T82848A Pain from vascular prosthetic devices, implants and grafts, initial encounter: Secondary | ICD-10-CM | POA: Diagnosis not present

## 2024-03-24 DIAGNOSIS — Z992 Dependence on renal dialysis: Secondary | ICD-10-CM | POA: Diagnosis not present

## 2024-03-24 DIAGNOSIS — T82898A Other specified complication of vascular prosthetic devices, implants and grafts, initial encounter: Secondary | ICD-10-CM | POA: Diagnosis not present

## 2024-03-24 DIAGNOSIS — N186 End stage renal disease: Secondary | ICD-10-CM | POA: Diagnosis not present

## 2024-03-26 DIAGNOSIS — E221 Hyperprolactinemia: Secondary | ICD-10-CM | POA: Diagnosis not present

## 2024-03-26 DIAGNOSIS — E1165 Type 2 diabetes mellitus with hyperglycemia: Secondary | ICD-10-CM | POA: Diagnosis not present

## 2024-03-27 ENCOUNTER — Other Ambulatory Visit (INDEPENDENT_AMBULATORY_CARE_PROVIDER_SITE_OTHER): Payer: Self-pay | Admitting: Vascular Surgery

## 2024-03-27 DIAGNOSIS — T82848A Pain from vascular prosthetic devices, implants and grafts, initial encounter: Secondary | ICD-10-CM | POA: Diagnosis not present

## 2024-03-27 DIAGNOSIS — T82898A Other specified complication of vascular prosthetic devices, implants and grafts, initial encounter: Secondary | ICD-10-CM | POA: Diagnosis not present

## 2024-03-27 DIAGNOSIS — N186 End stage renal disease: Secondary | ICD-10-CM

## 2024-03-27 DIAGNOSIS — Z79899 Other long term (current) drug therapy: Secondary | ICD-10-CM | POA: Diagnosis not present

## 2024-03-27 DIAGNOSIS — E119 Type 2 diabetes mellitus without complications: Secondary | ICD-10-CM | POA: Diagnosis not present

## 2024-03-27 DIAGNOSIS — Z95828 Presence of other vascular implants and grafts: Secondary | ICD-10-CM

## 2024-03-27 DIAGNOSIS — Z992 Dependence on renal dialysis: Secondary | ICD-10-CM | POA: Diagnosis not present

## 2024-03-28 ENCOUNTER — Encounter: Payer: Self-pay | Admitting: Vascular Surgery

## 2024-03-29 DIAGNOSIS — Z992 Dependence on renal dialysis: Secondary | ICD-10-CM | POA: Diagnosis not present

## 2024-03-29 DIAGNOSIS — N186 End stage renal disease: Secondary | ICD-10-CM | POA: Diagnosis not present

## 2024-03-29 DIAGNOSIS — T82898A Other specified complication of vascular prosthetic devices, implants and grafts, initial encounter: Secondary | ICD-10-CM | POA: Diagnosis not present

## 2024-03-29 DIAGNOSIS — T82848A Pain from vascular prosthetic devices, implants and grafts, initial encounter: Secondary | ICD-10-CM | POA: Diagnosis not present

## 2024-03-30 ENCOUNTER — Encounter (INDEPENDENT_AMBULATORY_CARE_PROVIDER_SITE_OTHER): Payer: Self-pay | Admitting: Vascular Surgery

## 2024-03-30 ENCOUNTER — Ambulatory Visit (INDEPENDENT_AMBULATORY_CARE_PROVIDER_SITE_OTHER)

## 2024-03-30 ENCOUNTER — Ambulatory Visit (INDEPENDENT_AMBULATORY_CARE_PROVIDER_SITE_OTHER): Admitting: Vascular Surgery

## 2024-03-30 VITALS — BP 111/71 | HR 94 | Resp 18 | Wt 393.0 lb

## 2024-03-30 DIAGNOSIS — T829XXS Unspecified complication of cardiac and vascular prosthetic device, implant and graft, sequela: Secondary | ICD-10-CM

## 2024-03-30 DIAGNOSIS — I1A0 Resistant hypertension: Secondary | ICD-10-CM

## 2024-03-30 DIAGNOSIS — E119 Type 2 diabetes mellitus without complications: Secondary | ICD-10-CM

## 2024-03-30 DIAGNOSIS — N186 End stage renal disease: Secondary | ICD-10-CM

## 2024-03-30 DIAGNOSIS — Z95828 Presence of other vascular implants and grafts: Secondary | ICD-10-CM

## 2024-03-30 DIAGNOSIS — Z992 Dependence on renal dialysis: Secondary | ICD-10-CM | POA: Diagnosis not present

## 2024-03-30 NOTE — Progress Notes (Signed)
 Subjective:    Patient ID: [name], male    DOB: 04/14/1971, 53 y.o.   MRN: [id number] Chief Complaint  Patient presents with   Routine Post Op    [facility] 2 week with HDA    Consult Trompeter is a 53 year old male who presents to clinic today postoperatively from:  PROCEDURE: Right forearm loop arteriovenous graft with 4 to 7 mm tapered PTFE graft.  Patient underwent right upper extremity vascular duplex ultrasound of his dialysis access today.  Patient's right upper extremity is healing as expected.  He does endorse that there is some pain with some tightness especially when he leaves his arm dependent.  He states that it gets better if he raises his arm and helps reduce the pressure of gravity.  No signs of steal syndrome to his hand.  No signs or symptoms of hematoma, seroma or infection to note.  Will discuss results of vascular ultrasound completed this morning.    Review of Systems  Constitutional: Negative.   Cardiovascular:  Positive for leg swelling.  Musculoskeletal:        Positive intermittent arm pain at the surgical site.  Skin:  Positive for wound.       Postoperative from right upper extremity AV graft placement.  All other systems reviewed and are negative.      Objective:   Physical Exam Constitutional:      Appearance: Normal appearance. He is obese.  HENT:     Head: Normocephalic.  Eyes:     Pupils: Pupils are equal, round, and reactive to light.  Cardiovascular:     Rate and Rhythm: Normal rate and regular rhythm.     Pulses: Normal pulses.     Heart sounds: Normal heart sounds.  Pulmonary:     Effort: Pulmonary effort is normal.     Breath sounds: Normal breath sounds.  Abdominal:     General: Bowel sounds are normal.     Palpations: Abdomen is soft.  Musculoskeletal:        General: Swelling and tenderness present.     Right lower leg: Edema present.     Left lower leg: Edema present.     Comments: Right upper extremity postoperative AV graft  placement  Skin:    General: Skin is warm and dry.     Capillary Refill: Capillary refill takes 2 to 3 seconds.  Neurological:     General: No focal deficit present.     Mental Status: He is alert and oriented to person, place, and time. Mental status is at baseline.  Psychiatric:        Mood and Affect: Mood normal.        Behavior: Behavior normal.        Thought Content: Thought content normal.        Judgment: Judgment normal.     BP 111/71   Pulse 94   Resp 18   Wt (!) 393 lb (178.3 kg)   BMI 47.84 kg/m   Past Medical History:  Diagnosis Date   Anemia    Chronic kidney disease    14% FUNCTION   Diabetes mellitus without complication (HCC)    Diabetic retinopathy (HCC)    Dyspnea    DOE   Dysrhythmia    End stage renal disease (HCC)    History of orthopnea    error wrong patietn   Hypercholesteremia    error   Hyperkalemia    error wrong patient   Hypertension  Long-term insulin  use (HCC)    Lymphedema of leg    Metabolic encephalopathy    error   Microalbuminuria    MSSA (methicillin susceptible Staphylococcus aureus)    Obesity    PVD (peripheral vascular disease) (HCC)    Respiratory failure (HCC)    Error   Sepsis (HCC)    error   Sleep apnea    no CPAP   UTI (urinary tract infection)    error   Venous ulcer (HCC)    Vitamin D deficiency     Social History   Socioeconomic History   Marital status: Divorced    Spouse name: Not on file   Number of children: Not on file   Years of education: Not on file   Highest education level: Not on file  Occupational History   Not on file  Tobacco Use   Smoking status: Former    Types: Cigars    Passive exposure: Past   Smokeless tobacco: Former    Quit date: 09/30/2017   Tobacco comments:    occasional  Vaping Use   Vaping status: Never Used  Substance and Sexual Activity   Alcohol  use: Yes    Alcohol /week: 1.0 standard drink of alcohol     Types: 1 Cans of beer per week    Comment: beer    Drug use: Not Currently   Sexual activity: Yes  Other Topics Concern   Not on file  Social History Narrative   Lives alone.  Significant other, Debbie.    Social Drivers of Health   Financial Resource Strain: Medium Risk (01/02/2024)   Received from [facility]   Overall Financial Resource Strain (CARDIA)    Difficulty of Paying Living Expenses: Somewhat hard  Food Insecurity: No Food Insecurity (02/08/2024)   Hunger Vital Sign    Worried About Running Out of Food in the Last Year: Never true    Ran Out of Food in the Last Year: Never true  Transportation Needs: No Transportation Needs (02/08/2024)   PRAPARE - Administrator, Civil Service (Medical): No    Lack of Transportation (Non-Medical): No  Physical Activity: Not on file  Stress: Not on file  Social Connections: Socially Integrated (02/08/2024)   Social Connection and Isolation Panel    Frequency of Communication with Friends and Family: Three times a week    Frequency of Social Gatherings with Friends and Family: Three times a week    Attends Religious Services: 1 to 4 times per year    Active Member of Clubs or Organizations: No    Attends Banker Meetings: 1 to 4 times per year    Marital Status: Living with partner  Intimate Partner Violence: Not At Risk (02/08/2024)   Humiliation, Afraid, Rape, and Kick questionnaire    Fear of Current or Ex-Partner: No    Emotionally Abused: No    Physically Abused: No    Sexually Abused: No    Past Surgical History:  Procedure Laterality Date   A/V FISTULAGRAM Right 10/24/2018   Procedure: A/V FISTULAGRAM;  Surgeon: Jama Cordella MATSU, MD;  Location: [facility];  Service: Cardiovascular;  Laterality: Right;   A/V FISTULAGRAM Right 11/24/2018   Procedure: A/V FISTULAGRAM;  Surgeon: Jama Cordella MATSU, MD;  Location: [facility];  Service: Cardiovascular;  Laterality: Right;   A/V FISTULAGRAM Left 07/31/2019   Procedure: A/V  FISTULAGRAM;  Surgeon: Jama Cordella MATSU, MD;  Location: [facility];  Service: Cardiovascular;  Laterality: Left;   A/V FISTULAGRAM Left 12/11/2019   Procedure: A/V FISTULAGRAM;  Surgeon: Jama Cordella MATSU, MD;  Location: [facility];  Service: Cardiovascular;  Laterality: Left;   A/V FISTULAGRAM Left 07/08/2023   Procedure: A/V Fistulagram;  Surgeon: Jama Cordella MATSU, MD;  Location: [facility];  Service: Cardiovascular;  Laterality: Left;   A/V FISTULAGRAM Left 09/16/2023   Procedure: A/V Fistulagram;  Surgeon: Jama Cordella MATSU, MD;  Location: [facility];  Service: Cardiovascular;  Laterality: Left;   A/V FISTULAGRAM Left 12/15/2023   Procedure: A/V Fistulagram;  Surgeon: Marea Selinda RAMAN, MD;  Location: [facility];  Service: Cardiovascular;  Laterality: Left;   A/V SHUNT INTERVENTION Right 02/21/2019   Procedure: A/V SHUNT INTERVENTION;  Surgeon: Jama Cordella MATSU, MD;  Location: [facility];  Service: Cardiovascular;  Laterality: Right;   APPLICATION OF WOUND VAC Left 10/03/2017   Procedure: APPLICATION OF WOUND VAC;  Surgeon: Marea Selinda RAMAN, MD;  Location: [facility];  Service: General;  Laterality: Left;   APPLICATION OF WOUND VAC Left 10/11/2017   Procedure: APPLICATION OF WOUND VAC;  Surgeon: Jama Cordella MATSU, MD;  Location: [facility];  Service: Vascular;  Laterality: Left;   APPLICATION OF WOUND VAC Left 10/14/2017   Procedure: WOUND VAC CHANGE;  Surgeon: Jama Cordella MATSU, MD;  Location: [facility];  Service: Vascular;  Laterality: Left;  left lower leg   APPLICATION OF WOUND VAC Left 10/18/2017   Procedure: WOUND VAC CHANGE;  Surgeon: Jama Cordella MATSU, MD;  Location: [facility];  Service: Vascular;  Laterality: Left;   APPLICATION OF WOUND VAC Left 10/07/2017   Procedure: APPLICATION OF WOUND VAC;  Surgeon: Jama Cordella MATSU, MD;  Location: [facility];  Service: Vascular;  Laterality: Left;   APPLICATION OF WOUND VAC Left 11/17/2022    Procedure: Wound vac exchange;  Surgeon: Jama Cordella MATSU, MD;  Location: [facility];  Service: Vascular;  Laterality: Left;   APPLICATION OF WOUND VAC Left 11/10/2022   Procedure: APPLICATION OF WOUND VAC;  Surgeon: Jama Cordella MATSU, MD;  Location: [facility];  Service: Vascular;  Laterality: Left;   APPLICATION OF WOUND VAC Left 11/24/2022   Procedure: WOUND VAC REMOVAL;  Surgeon: Jama Cordella MATSU, MD;  Location: [facility];  Service: Vascular;  Laterality: Left;   APPLICATION OF WOUND VAC Left 11/12/2022   Procedure: APPLICATION OF WOUND VAC;  Surgeon: Jama Cordella MATSU, MD;  Location: [facility];  Service: Vascular;  Laterality: Left;   APPLICATION OF WOUND VAC Left 01/06/2023   Procedure: APPLICATION OF WOUND VAC;  Surgeon: Jama Cordella MATSU, MD;  Location: [facility];  Service: Vascular;  Laterality: Left;   AV FISTULA INSERTION W/ RF MAGNETIC GUIDANCE Right 07/19/2018   Procedure: AV FISTULA INSERTION W/RF MAGNETIC GUIDANCE;  Surgeon: Jama Cordella MATSU, MD;  Location: [facility];  Service: Cardiovascular;  Laterality: Right;   AV FISTULA PLACEMENT Left 05/11/2019   Procedure: ARTERIOVENOUS (AV) FISTULA CREATION (RADIOCEPHALIC );  Surgeon: Jama Cordella MATSU, MD;  Location: [facility];  Service: Vascular;  Laterality: Left;   AV FISTULA PLACEMENT Left 02/20/2020   Procedure: INSERTION OF ARTERIOVENOUS (AV) GORE-TEX GRAFT LEFT ARM ( FOREARM LOOP );  Surgeon: Jama Cordella MATSU, MD;  Location: [facility];  Service: Vascular;  Laterality: Left;   CATARACT EXTRACTION W/PHACO Left 11/01/2018   Procedure: CATARACT EXTRACTION PHACO AND INTRAOCULAR LENS PLACEMENT (IOC)-LEFT, DIABETIC-INSULIN  DEPENDENT;  Surgeon: Myrna Adine Anes, MD;  Location: [facility];  Service: Ophthalmology;  Laterality: Left;  US  00:41.8 CDE 4.61 Fluid Pack Lot # [id number]   CATARACT EXTRACTION W/PHACO Right 04/27/2019   Procedure: CATARACT EXTRACTION PHACO AND INTRAOCULAR LENS PLACEMENT (IOC);  Surgeon: Myrna Adine Anes, MD;   Location: [facility];  Service: Ophthalmology;  Laterality: Right;  US  00:34 CDE 1.97 FLUID PACK LOT # [id number]    COLONOSCOPY WITH PROPOFOL  N/A 05/12/2020   Procedure: COLONOSCOPY WITH PROPOFOL ;  Surgeon: Toledo, Ladell POUR, MD;  Location: [facility];  Service: Gastroenterology;  Laterality: N/A;   DIALYSIS/PERMA CATHETER INSERTION N/A 02/23/2019   Procedure: DIALYSIS/PERMA CATHETER INSERTION;  Surgeon: Marea Selinda RAMAN, MD;  Location: [facility];  Service: Cardiovascular;  Laterality: N/A;   DIALYSIS/PERMA CATHETER INSERTION Right 12/15/2023   Procedure: DIALYSIS/PERMA CATHETER INSERTION;  Surgeon: Marea Selinda RAMAN, MD;  Location: [facility];  Service: Cardiovascular;  Laterality: Right;   DIALYSIS/PERMA CATHETER REMOVAL N/A 04/03/2020   Procedure: DIALYSIS/PERMA CATHETER REMOVAL;  Surgeon: Jama Cordella MATSU, MD;  Location: [facility];  Service: Cardiovascular;  Laterality: N/A;   DIALYSIS/PERMA CATHETER REMOVAL N/A 02/03/2022   Procedure: DIALYSIS/PERMA CATHETER REMOVAL;  Surgeon: Jama Cordella MATSU, MD;  Location: [facility];  Service: Cardiovascular;  Laterality: N/A;   I & D EXTREMITY Left 10/11/2017   Procedure: IRRIGATION AND DEBRIDEMENT EXTREMITY;  Surgeon: Jama Cordella MATSU, MD;  Location: [facility];  Service: Vascular;  Laterality: Left;   INCISION AND DRAINAGE ABSCESS Right 10/07/2017   Procedure: INCISION AND DRAINAGE ABSCESS;  Surgeon: Jama Cordella MATSU, MD;  Location: [facility];  Service: Vascular;  Laterality: Right;   INCISION AND DRAINAGE OF WOUND Left 11/17/2022   Procedure: DEBRIDEMENT WOUND;  Surgeon: Jama Cordella MATSU, MD;  Location: [facility];  Service: Vascular;  Laterality: Left;   INCISION AND DRAINAGE OF WOUND Left 11/24/2022   Procedure: DEBRIDEMENT WOUND LEFT LOWER EXTREMITY;  Surgeon: Jama Cordella MATSU, MD;  Location: [facility];  Service: Vascular;  Laterality: Left;   INSERTION OF ARTERIOVENOUS (AV) ARTEGRAFT ARM Right 03/14/2024   Procedure:  INSERTION, GRAFT, ARTERIOVENOUS, UPPER EXTREMITY;  Surgeon: Jama Cordella MATSU, MD;  Location: [facility];  Service: Vascular;  Laterality: Right;   IRRIGATION AND DEBRIDEMENT ABSCESS Left 10/03/2017   Procedure: IRRIGATION AND DEBRIDEMENT ABSCESS with debridement of skin, soft tissue, muscle 50sq cm;  Surgeon: Marea Selinda RAMAN, MD;  Location: [facility];  Service: General;  Laterality: Left;   LOWER EXTREMITY ANGIOGRAPHY Left 10/15/2022   Procedure: Lower Extremity Angiography;  Surgeon: Jama Cordella MATSU, MD;  Location: [facility];  Service: Cardiovascular;  Laterality: Left;   TEE WITHOUT CARDIOVERSION N/A 03/29/2023   Procedure: TRANSESOPHAGEAL ECHOCARDIOGRAM;  Surgeon: Perla Evalene PARAS, MD;  Location: [facility];  Service: Cardiovascular;  Laterality: N/A;   TEMPORARY DIALYSIS CATHETER  02/20/2019   Procedure: TEMPORARY DIALYSIS CATHETER;  Surgeon: Jama Cordella MATSU, MD;  Location: [facility];  Service: Cardiovascular;;   TEMPORARY DIALYSIS CATHETER N/A 12/13/2023   Procedure: TEMPORARY DIALYSIS CATHETER;  Surgeon: Jama Cordella MATSU, MD;  Location: [facility];  Service: Cardiovascular;  Laterality: N/A;   UPPER EXTREMITY ANGIOGRAPHY Left 09/18/2019   Procedure: UPPER EXTREMITY ANGIOGRAPHY;  Surgeon: Jama Cordella MATSU, MD;  Location: [facility];  Service: Cardiovascular;  Laterality: Left;   WOUND DEBRIDEMENT Left 11/10/2022   Procedure: DEBRIDEMENT WOUND;  Surgeon: Jama Cordella MATSU, MD;  Location: [facility];  Service: Vascular;  Laterality: Left;   WOUND DEBRIDEMENT Left 11/12/2022  Procedure: DEBRIDEMENT WOUND;  Surgeon: Jama Cordella MATSU, MD;  Location: [facility];  Service: Vascular;  Laterality: Left;   WOUND DEBRIDEMENT Left 01/06/2023   Procedure: DEBRIDEMENT WOUND;  Surgeon: Jama Cordella MATSU, MD;  Location: [facility];  Service: Vascular;  Laterality: Left;    Family History  Problem Relation Age of Onset   Diabetes Father    Kidney disease Father     Diabetes Daughter     Allergies  Allergen Reactions   Lisinopril Swelling    Lips mouth    Furosemide  Cough    Light headed, blurry vision, weakness.   Latex Itching   Silicone Itching   Adhesive [Tape] Itching and Rash       Latest Ref Rng & Units 03/14/2024   11:25 AM 02/15/2024    8:34 AM 12/16/2023    4:22 AM  CBC  WBC 4.0 - 10.5 K/uL  6.1  5.8   Hemoglobin 13.0 - 17.0 g/dL 83.2  86.3  86.6   Hematocrit 39.0 - 52.0 % 49.0  41.1  39.7   Platelets 150 - 400 K/uL  168  102       CMP     Component Value Date/Time   NA 139 03/14/2024 1125   K 5.2 (H) 03/14/2024 1125   CL 103 03/14/2024 1125   CO2 24 02/15/2024 0834   GLUCOSE 89 03/14/2024 1125   BUN 50 (H) 03/14/2024 1125   CREATININE 14.60 (H) 03/14/2024 1125   CALCIUM  9.1 02/15/2024 0834   PROT 7.5 12/12/2023 1518   ALBUMIN 3.9 12/12/2023 1518   AST 9 (L) 12/12/2023 1518   ALT 13 12/12/2023 1518   ALKPHOS 51 12/12/2023 1518   BILITOT 1.0 12/12/2023 1518   GFRNONAA 4 (L) 02/15/2024 0834     No results found.     Assessment & Plan:   1. Complication of vascular access for dialysis, sequela (Primary) Patient underwent HDA of right lower extremity newly placed fistulagram.  Vascular US  duplex dialysis access ultrasound was completed today RT will anastomosis increased PSV of 551.  Patient's total flow volume is 291 mL/min.  I do long discussion with the patient in clinic with his mother at his side today.  Patient will need a fistulogram to assess the patient's new access.  I discussed in detail with him at the bedside today the procedure, benefits, risk, complications.  Patient verbalizes understanding wishes to proceed with the plan.  Patient was told this to be done as an outpatient procedure once authorization is given he will be called for date and time to schedule.  2. Resistant hypertension Continue antihypertensive medications as already ordered, these medications have been reviewed and there are no  changes at this time.  3. Diabetes mellitus without complication (HCC) Continue hypoglycemic medications as already ordered, these medications have been reviewed and there are no changes at this time.  Hgb A1C to be monitored as already arranged by primary service   Current Outpatient Medications on File Prior to Visit  Medication Sig Dispense Refill   ACCU-CHEK GUIDE test strip USE 1 STRIP TOTAL 2 TIMES DAILY FOR 90 DAYS     acetaminophen  (TYLENOL ) 325 MG tablet Take 2 tablets (650 mg total) by mouth every 6 (six) hours as needed for mild pain (or Fever >/= 101).     allopurinol  (ZYLOPRIM ) 100 MG tablet Take 100 mg by mouth daily.     amLODipine  (NORVASC ) 10 MG tablet Take 5 mg by mouth daily as needed (if bp  is 190/90 or higher).      apixaban  (ELIQUIS ) 5 MG TABS tablet Take 1 tablet (5 mg total) by mouth 2 (two) times daily. Please restart your Eliquis  with tomorrow morning's dose 60 tablet 0   atorvastatin  (LIPITOR) 80 MG tablet Take 80 mg by mouth at bedtime.     B Complex-C-Folic Acid  (RENA-VITE RX) 1 MG TABS Take 1 tablet by mouth 3 (three) times a week.      calcium  acetate (PHOSLO ) 667 MG capsule Take 1,334-2,001 mg by mouth See admin instructions. Take 2001 mg by mouth with each meal & take 1334 mg by mouth with each snack.     cinacalcet  (SENSIPAR ) 30 MG tablet Take 60 mg by mouth daily.     CINNAMON  PO Take 500 mg by mouth 2 (two) times daily.      clopidogrel  (PLAVIX ) 75 MG tablet Take 1 tablet (75 mg total) by mouth daily. 30 tablet 0   Continuous Glucose Sensor (DEXCOM G7 SENSOR) MISC USE 1 EACH EVERY 10 (TEN) DAYS     HYDROcodone -acetaminophen  (NORCO/VICODIN) 5-325 MG tablet Take 1-2 tablets by mouth every 6 (six) hours as needed for moderate pain (pain score 4-6) or severe pain (pain score 7-10). 36 tablet 0   Insulin  Aspart FlexPen (NOVOLOG ) 100 UNIT/ML Inject 5 Units subcutaneously 3 (three) times daily with meals SKIP dose if your sugar before your meal is your sugar is  below 100.     insulin  degludec (TRESIBA) 100 UNIT/ML FlexTouch Pen Inject 30 Units subcutaneously once daily     lidocaine -prilocaine  (EMLA ) cream Apply 1 application topically as needed (port access).     Multiple Vitamins-Minerals (EYE HEALTH PO) Take 1 capsule by mouth daily.      OZEMPIC, 0.25 OR 0.5 MG/DOSE, 2 MG/3ML SOPN 0.25 mg once a week.     Polyethyl Glycol-Propyl Glycol (SYSTANE OP) Place 1 drop into both eyes daily as needed (dry eyes).     No current facility-administered medications on file prior to visit.    There are no Patient Instructions on file for this visit. No follow-ups on file.   Gwendlyn JONELLE Shank, NP

## 2024-03-30 NOTE — H&P (View-Only) (Signed)
 Subjective:    Patient ID: [name], male    DOB: 04/14/1971, 53 y.o.   MRN: [id number] Chief Complaint  Patient presents with   Routine Post Op    [facility] 2 week with HDA    Consult Trompeter is a 53 year old male who presents to clinic today postoperatively from:  PROCEDURE: Right forearm loop arteriovenous graft with 4 to 7 mm tapered PTFE graft.  Patient underwent right upper extremity vascular duplex ultrasound of his dialysis access today.  Patient's right upper extremity is healing as expected.  He does endorse that there is some pain with some tightness especially when he leaves his arm dependent.  He states that it gets better if he raises his arm and helps reduce the pressure of gravity.  No signs of steal syndrome to his hand.  No signs or symptoms of hematoma, seroma or infection to note.  Will discuss results of vascular ultrasound completed this morning.    Review of Systems  Constitutional: Negative.   Cardiovascular:  Positive for leg swelling.  Musculoskeletal:        Positive intermittent arm pain at the surgical site.  Skin:  Positive for wound.       Postoperative from right upper extremity AV graft placement.  All other systems reviewed and are negative.      Objective:   Physical Exam Constitutional:      Appearance: Normal appearance. He is obese.  HENT:     Head: Normocephalic.  Eyes:     Pupils: Pupils are equal, round, and reactive to light.  Cardiovascular:     Rate and Rhythm: Normal rate and regular rhythm.     Pulses: Normal pulses.     Heart sounds: Normal heart sounds.  Pulmonary:     Effort: Pulmonary effort is normal.     Breath sounds: Normal breath sounds.  Abdominal:     General: Bowel sounds are normal.     Palpations: Abdomen is soft.  Musculoskeletal:        General: Swelling and tenderness present.     Right lower leg: Edema present.     Left lower leg: Edema present.     Comments: Right upper extremity postoperative AV graft  placement  Skin:    General: Skin is warm and dry.     Capillary Refill: Capillary refill takes 2 to 3 seconds.  Neurological:     General: No focal deficit present.     Mental Status: He is alert and oriented to person, place, and time. Mental status is at baseline.  Psychiatric:        Mood and Affect: Mood normal.        Behavior: Behavior normal.        Thought Content: Thought content normal.        Judgment: Judgment normal.     BP 111/71   Pulse 94   Resp 18   Wt (!) 393 lb (178.3 kg)   BMI 47.84 kg/m   Past Medical History:  Diagnosis Date   Anemia    Chronic kidney disease    14% FUNCTION   Diabetes mellitus without complication (HCC)    Diabetic retinopathy (HCC)    Dyspnea    DOE   Dysrhythmia    End stage renal disease (HCC)    History of orthopnea    error wrong patietn   Hypercholesteremia    error   Hyperkalemia    error wrong patient   Hypertension  Long-term insulin  use (HCC)    Lymphedema of leg    Metabolic encephalopathy    error   Microalbuminuria    MSSA (methicillin susceptible Staphylococcus aureus)    Obesity    PVD (peripheral vascular disease) (HCC)    Respiratory failure (HCC)    Error   Sepsis (HCC)    error   Sleep apnea    no CPAP   UTI (urinary tract infection)    error   Venous ulcer (HCC)    Vitamin D deficiency     Social History   Socioeconomic History   Marital status: Divorced    Spouse name: Not on file   Number of children: Not on file   Years of education: Not on file   Highest education level: Not on file  Occupational History   Not on file  Tobacco Use   Smoking status: Former    Types: Cigars    Passive exposure: Past   Smokeless tobacco: Former    Quit date: 09/30/2017   Tobacco comments:    occasional  Vaping Use   Vaping status: Never Used  Substance and Sexual Activity   Alcohol  use: Yes    Alcohol /week: 1.0 standard drink of alcohol     Types: 1 Cans of beer per week    Comment: beer    Drug use: Not Currently   Sexual activity: Yes  Other Topics Concern   Not on file  Social History Narrative   Lives alone.  Significant other, Debbie.    Social Drivers of Health   Financial Resource Strain: Medium Risk (01/02/2024)   Received from [facility]   Overall Financial Resource Strain (CARDIA)    Difficulty of Paying Living Expenses: Somewhat hard  Food Insecurity: No Food Insecurity (02/08/2024)   Hunger Vital Sign    Worried About Running Out of Food in the Last Year: Never true    Ran Out of Food in the Last Year: Never true  Transportation Needs: No Transportation Needs (02/08/2024)   PRAPARE - Administrator, Civil Service (Medical): No    Lack of Transportation (Non-Medical): No  Physical Activity: Not on file  Stress: Not on file  Social Connections: Socially Integrated (02/08/2024)   Social Connection and Isolation Panel    Frequency of Communication with Friends and Family: Three times a week    Frequency of Social Gatherings with Friends and Family: Three times a week    Attends Religious Services: 1 to 4 times per year    Active Member of Clubs or Organizations: No    Attends Banker Meetings: 1 to 4 times per year    Marital Status: Living with partner  Intimate Partner Violence: Not At Risk (02/08/2024)   Humiliation, Afraid, Rape, and Kick questionnaire    Fear of Current or Ex-Partner: No    Emotionally Abused: No    Physically Abused: No    Sexually Abused: No    Past Surgical History:  Procedure Laterality Date   A/V FISTULAGRAM Right 10/24/2018   Procedure: A/V FISTULAGRAM;  Surgeon: Jama Cordella MATSU, MD;  Location: [facility];  Service: Cardiovascular;  Laterality: Right;   A/V FISTULAGRAM Right 11/24/2018   Procedure: A/V FISTULAGRAM;  Surgeon: Jama Cordella MATSU, MD;  Location: [facility];  Service: Cardiovascular;  Laterality: Right;   A/V FISTULAGRAM Left 07/31/2019   Procedure: A/V  FISTULAGRAM;  Surgeon: Jama Cordella MATSU, MD;  Location: [facility];  Service: Cardiovascular;  Laterality: Left;   A/V FISTULAGRAM Left 12/11/2019   Procedure: A/V FISTULAGRAM;  Surgeon: Jama Cordella MATSU, MD;  Location: [facility];  Service: Cardiovascular;  Laterality: Left;   A/V FISTULAGRAM Left 07/08/2023   Procedure: A/V Fistulagram;  Surgeon: Jama Cordella MATSU, MD;  Location: [facility];  Service: Cardiovascular;  Laterality: Left;   A/V FISTULAGRAM Left 09/16/2023   Procedure: A/V Fistulagram;  Surgeon: Jama Cordella MATSU, MD;  Location: [facility];  Service: Cardiovascular;  Laterality: Left;   A/V FISTULAGRAM Left 12/15/2023   Procedure: A/V Fistulagram;  Surgeon: Marea Selinda RAMAN, MD;  Location: [facility];  Service: Cardiovascular;  Laterality: Left;   A/V SHUNT INTERVENTION Right 02/21/2019   Procedure: A/V SHUNT INTERVENTION;  Surgeon: Jama Cordella MATSU, MD;  Location: [facility];  Service: Cardiovascular;  Laterality: Right;   APPLICATION OF WOUND VAC Left 10/03/2017   Procedure: APPLICATION OF WOUND VAC;  Surgeon: Marea Selinda RAMAN, MD;  Location: [facility];  Service: General;  Laterality: Left;   APPLICATION OF WOUND VAC Left 10/11/2017   Procedure: APPLICATION OF WOUND VAC;  Surgeon: Jama Cordella MATSU, MD;  Location: [facility];  Service: Vascular;  Laterality: Left;   APPLICATION OF WOUND VAC Left 10/14/2017   Procedure: WOUND VAC CHANGE;  Surgeon: Jama Cordella MATSU, MD;  Location: [facility];  Service: Vascular;  Laterality: Left;  left lower leg   APPLICATION OF WOUND VAC Left 10/18/2017   Procedure: WOUND VAC CHANGE;  Surgeon: Jama Cordella MATSU, MD;  Location: [facility];  Service: Vascular;  Laterality: Left;   APPLICATION OF WOUND VAC Left 10/07/2017   Procedure: APPLICATION OF WOUND VAC;  Surgeon: Jama Cordella MATSU, MD;  Location: [facility];  Service: Vascular;  Laterality: Left;   APPLICATION OF WOUND VAC Left 11/17/2022    Procedure: Wound vac exchange;  Surgeon: Jama Cordella MATSU, MD;  Location: [facility];  Service: Vascular;  Laterality: Left;   APPLICATION OF WOUND VAC Left 11/10/2022   Procedure: APPLICATION OF WOUND VAC;  Surgeon: Jama Cordella MATSU, MD;  Location: [facility];  Service: Vascular;  Laterality: Left;   APPLICATION OF WOUND VAC Left 11/24/2022   Procedure: WOUND VAC REMOVAL;  Surgeon: Jama Cordella MATSU, MD;  Location: [facility];  Service: Vascular;  Laterality: Left;   APPLICATION OF WOUND VAC Left 11/12/2022   Procedure: APPLICATION OF WOUND VAC;  Surgeon: Jama Cordella MATSU, MD;  Location: [facility];  Service: Vascular;  Laterality: Left;   APPLICATION OF WOUND VAC Left 01/06/2023   Procedure: APPLICATION OF WOUND VAC;  Surgeon: Jama Cordella MATSU, MD;  Location: [facility];  Service: Vascular;  Laterality: Left;   AV FISTULA INSERTION W/ RF MAGNETIC GUIDANCE Right 07/19/2018   Procedure: AV FISTULA INSERTION W/RF MAGNETIC GUIDANCE;  Surgeon: Jama Cordella MATSU, MD;  Location: [facility];  Service: Cardiovascular;  Laterality: Right;   AV FISTULA PLACEMENT Left 05/11/2019   Procedure: ARTERIOVENOUS (AV) FISTULA CREATION (RADIOCEPHALIC );  Surgeon: Jama Cordella MATSU, MD;  Location: [facility];  Service: Vascular;  Laterality: Left;   AV FISTULA PLACEMENT Left 02/20/2020   Procedure: INSERTION OF ARTERIOVENOUS (AV) GORE-TEX GRAFT LEFT ARM ( FOREARM LOOP );  Surgeon: Jama Cordella MATSU, MD;  Location: [facility];  Service: Vascular;  Laterality: Left;   CATARACT EXTRACTION W/PHACO Left 11/01/2018   Procedure: CATARACT EXTRACTION PHACO AND INTRAOCULAR LENS PLACEMENT (IOC)-LEFT, DIABETIC-INSULIN  DEPENDENT;  Surgeon: Myrna Adine Anes, MD;  Location: [facility]  ORS;  Service: Ophthalmology;  Laterality: Left;  US  00:41.8 CDE 4.61 Fluid Pack Lot # [id number]   CATARACT EXTRACTION W/PHACO Right 04/27/2019   Procedure: CATARACT EXTRACTION PHACO AND INTRAOCULAR LENS PLACEMENT (IOC);  Surgeon: Myrna Adine Anes, MD;   Location: [facility];  Service: Ophthalmology;  Laterality: Right;  US  00:34 CDE 1.97 FLUID PACK LOT # [id number]    COLONOSCOPY WITH PROPOFOL  N/A 05/12/2020   Procedure: COLONOSCOPY WITH PROPOFOL ;  Surgeon: Toledo, Ladell POUR, MD;  Location: [facility];  Service: Gastroenterology;  Laterality: N/A;   DIALYSIS/PERMA CATHETER INSERTION N/A 02/23/2019   Procedure: DIALYSIS/PERMA CATHETER INSERTION;  Surgeon: Marea Selinda RAMAN, MD;  Location: [facility];  Service: Cardiovascular;  Laterality: N/A;   DIALYSIS/PERMA CATHETER INSERTION Right 12/15/2023   Procedure: DIALYSIS/PERMA CATHETER INSERTION;  Surgeon: Marea Selinda RAMAN, MD;  Location: [facility];  Service: Cardiovascular;  Laterality: Right;   DIALYSIS/PERMA CATHETER REMOVAL N/A 04/03/2020   Procedure: DIALYSIS/PERMA CATHETER REMOVAL;  Surgeon: Jama Cordella MATSU, MD;  Location: [facility];  Service: Cardiovascular;  Laterality: N/A;   DIALYSIS/PERMA CATHETER REMOVAL N/A 02/03/2022   Procedure: DIALYSIS/PERMA CATHETER REMOVAL;  Surgeon: Jama Cordella MATSU, MD;  Location: [facility];  Service: Cardiovascular;  Laterality: N/A;   I & D EXTREMITY Left 10/11/2017   Procedure: IRRIGATION AND DEBRIDEMENT EXTREMITY;  Surgeon: Jama Cordella MATSU, MD;  Location: [facility];  Service: Vascular;  Laterality: Left;   INCISION AND DRAINAGE ABSCESS Right 10/07/2017   Procedure: INCISION AND DRAINAGE ABSCESS;  Surgeon: Jama Cordella MATSU, MD;  Location: [facility];  Service: Vascular;  Laterality: Right;   INCISION AND DRAINAGE OF WOUND Left 11/17/2022   Procedure: DEBRIDEMENT WOUND;  Surgeon: Jama Cordella MATSU, MD;  Location: [facility];  Service: Vascular;  Laterality: Left;   INCISION AND DRAINAGE OF WOUND Left 11/24/2022   Procedure: DEBRIDEMENT WOUND LEFT LOWER EXTREMITY;  Surgeon: Jama Cordella MATSU, MD;  Location: [facility];  Service: Vascular;  Laterality: Left;   INSERTION OF ARTERIOVENOUS (AV) ARTEGRAFT ARM Right 03/14/2024   Procedure:  INSERTION, GRAFT, ARTERIOVENOUS, UPPER EXTREMITY;  Surgeon: Jama Cordella MATSU, MD;  Location: [facility];  Service: Vascular;  Laterality: Right;   IRRIGATION AND DEBRIDEMENT ABSCESS Left 10/03/2017   Procedure: IRRIGATION AND DEBRIDEMENT ABSCESS with debridement of skin, soft tissue, muscle 50sq cm;  Surgeon: Marea Selinda RAMAN, MD;  Location: [facility];  Service: General;  Laterality: Left;   LOWER EXTREMITY ANGIOGRAPHY Left 10/15/2022   Procedure: Lower Extremity Angiography;  Surgeon: Jama Cordella MATSU, MD;  Location: [facility];  Service: Cardiovascular;  Laterality: Left;   TEE WITHOUT CARDIOVERSION N/A 03/29/2023   Procedure: TRANSESOPHAGEAL ECHOCARDIOGRAM;  Surgeon: Perla Evalene PARAS, MD;  Location: [facility];  Service: Cardiovascular;  Laterality: N/A;   TEMPORARY DIALYSIS CATHETER  02/20/2019   Procedure: TEMPORARY DIALYSIS CATHETER;  Surgeon: Jama Cordella MATSU, MD;  Location: [facility];  Service: Cardiovascular;;   TEMPORARY DIALYSIS CATHETER N/A 12/13/2023   Procedure: TEMPORARY DIALYSIS CATHETER;  Surgeon: Jama Cordella MATSU, MD;  Location: [facility];  Service: Cardiovascular;  Laterality: N/A;   UPPER EXTREMITY ANGIOGRAPHY Left 09/18/2019   Procedure: UPPER EXTREMITY ANGIOGRAPHY;  Surgeon: Jama Cordella MATSU, MD;  Location: [facility];  Service: Cardiovascular;  Laterality: Left;   WOUND DEBRIDEMENT Left 11/10/2022   Procedure: DEBRIDEMENT WOUND;  Surgeon: Jama Cordella MATSU, MD;  Location: [facility];  Service: Vascular;  Laterality: Left;   WOUND DEBRIDEMENT Left 11/12/2022  Procedure: DEBRIDEMENT WOUND;  Surgeon: Jama Cordella MATSU, MD;  Location: [facility];  Service: Vascular;  Laterality: Left;   WOUND DEBRIDEMENT Left 01/06/2023   Procedure: DEBRIDEMENT WOUND;  Surgeon: Jama Cordella MATSU, MD;  Location: [facility];  Service: Vascular;  Laterality: Left;    Family History  Problem Relation Age of Onset   Diabetes Father    Kidney disease Father     Diabetes Daughter     Allergies  Allergen Reactions   Lisinopril Swelling    Lips mouth    Furosemide  Cough    Light headed, blurry vision, weakness.   Latex Itching   Silicone Itching   Adhesive [Tape] Itching and Rash       Latest Ref Rng & Units 03/14/2024   11:25 AM 02/15/2024    8:34 AM 12/16/2023    4:22 AM  CBC  WBC 4.0 - 10.5 K/uL  6.1  5.8   Hemoglobin 13.0 - 17.0 g/dL 83.2  86.3  86.6   Hematocrit 39.0 - 52.0 % 49.0  41.1  39.7   Platelets 150 - 400 K/uL  168  102       CMP     Component Value Date/Time   NA 139 03/14/2024 1125   K 5.2 (H) 03/14/2024 1125   CL 103 03/14/2024 1125   CO2 24 02/15/2024 0834   GLUCOSE 89 03/14/2024 1125   BUN 50 (H) 03/14/2024 1125   CREATININE 14.60 (H) 03/14/2024 1125   CALCIUM  9.1 02/15/2024 0834   PROT 7.5 12/12/2023 1518   ALBUMIN 3.9 12/12/2023 1518   AST 9 (L) 12/12/2023 1518   ALT 13 12/12/2023 1518   ALKPHOS 51 12/12/2023 1518   BILITOT 1.0 12/12/2023 1518   GFRNONAA 4 (L) 02/15/2024 0834     No results found.     Assessment & Plan:   1. Complication of vascular access for dialysis, sequela (Primary) Patient underwent HDA of right lower extremity newly placed fistulagram.  Vascular US  duplex dialysis access ultrasound was completed today RT will anastomosis increased PSV of 551.  Patient's total flow volume is 291 mL/min.  I do long discussion with the patient in clinic with his mother at his side today.  Patient will need a fistulogram to assess the patient's new access.  I discussed in detail with him at the bedside today the procedure, benefits, risk, complications.  Patient verbalizes understanding wishes to proceed with the plan.  Patient was told this to be done as an outpatient procedure once authorization is given he will be called for date and time to schedule.  2. Resistant hypertension Continue antihypertensive medications as already ordered, these medications have been reviewed and there are no  changes at this time.  3. Diabetes mellitus without complication (HCC) Continue hypoglycemic medications as already ordered, these medications have been reviewed and there are no changes at this time.  Hgb A1C to be monitored as already arranged by primary service   Current Outpatient Medications on File Prior to Visit  Medication Sig Dispense Refill   ACCU-CHEK GUIDE test strip USE 1 STRIP TOTAL 2 TIMES DAILY FOR 90 DAYS     acetaminophen  (TYLENOL ) 325 MG tablet Take 2 tablets (650 mg total) by mouth every 6 (six) hours as needed for mild pain (or Fever >/= 101).     allopurinol  (ZYLOPRIM ) 100 MG tablet Take 100 mg by mouth daily.     amLODipine  (NORVASC ) 10 MG tablet Take 5 mg by mouth daily as needed (if bp  is 190/90 or higher).      apixaban  (ELIQUIS ) 5 MG TABS tablet Take 1 tablet (5 mg total) by mouth 2 (two) times daily. Please restart your Eliquis  with tomorrow morning's dose 60 tablet 0   atorvastatin  (LIPITOR) 80 MG tablet Take 80 mg by mouth at bedtime.     B Complex-C-Folic Acid  (RENA-VITE RX) 1 MG TABS Take 1 tablet by mouth 3 (three) times a week.      calcium  acetate (PHOSLO ) 667 MG capsule Take 1,334-2,001 mg by mouth See admin instructions. Take 2001 mg by mouth with each meal & take 1334 mg by mouth with each snack.     cinacalcet  (SENSIPAR ) 30 MG tablet Take 60 mg by mouth daily.     CINNAMON  PO Take 500 mg by mouth 2 (two) times daily.      clopidogrel  (PLAVIX ) 75 MG tablet Take 1 tablet (75 mg total) by mouth daily. 30 tablet 0   Continuous Glucose Sensor (DEXCOM G7 SENSOR) MISC USE 1 EACH EVERY 10 (TEN) DAYS     HYDROcodone -acetaminophen  (NORCO/VICODIN) 5-325 MG tablet Take 1-2 tablets by mouth every 6 (six) hours as needed for moderate pain (pain score 4-6) or severe pain (pain score 7-10). 36 tablet 0   Insulin  Aspart FlexPen (NOVOLOG ) 100 UNIT/ML Inject 5 Units subcutaneously 3 (three) times daily with meals SKIP dose if your sugar before your meal is your sugar is  below 100.     insulin  degludec (TRESIBA) 100 UNIT/ML FlexTouch Pen Inject 30 Units subcutaneously once daily     lidocaine -prilocaine  (EMLA ) cream Apply 1 application topically as needed (port access).     Multiple Vitamins-Minerals (EYE HEALTH PO) Take 1 capsule by mouth daily.      OZEMPIC, 0.25 OR 0.5 MG/DOSE, 2 MG/3ML SOPN 0.25 mg once a week.     Polyethyl Glycol-Propyl Glycol (SYSTANE OP) Place 1 drop into both eyes daily as needed (dry eyes).     No current facility-administered medications on file prior to visit.    There are no Patient Instructions on file for this visit. No follow-ups on file.   Gwendlyn JONELLE Shank, NP

## 2024-03-31 DIAGNOSIS — Z992 Dependence on renal dialysis: Secondary | ICD-10-CM | POA: Diagnosis not present

## 2024-03-31 DIAGNOSIS — N186 End stage renal disease: Secondary | ICD-10-CM | POA: Diagnosis not present

## 2024-04-03 DIAGNOSIS — Z992 Dependence on renal dialysis: Secondary | ICD-10-CM | POA: Diagnosis not present

## 2024-04-03 DIAGNOSIS — N186 End stage renal disease: Secondary | ICD-10-CM | POA: Diagnosis not present

## 2024-04-04 DIAGNOSIS — E1159 Type 2 diabetes mellitus with other circulatory complications: Secondary | ICD-10-CM | POA: Diagnosis not present

## 2024-04-04 DIAGNOSIS — E785 Hyperlipidemia, unspecified: Secondary | ICD-10-CM | POA: Diagnosis not present

## 2024-04-04 DIAGNOSIS — E1165 Type 2 diabetes mellitus with hyperglycemia: Secondary | ICD-10-CM | POA: Diagnosis not present

## 2024-04-04 DIAGNOSIS — Z992 Dependence on renal dialysis: Secondary | ICD-10-CM | POA: Diagnosis not present

## 2024-04-04 DIAGNOSIS — E1122 Type 2 diabetes mellitus with diabetic chronic kidney disease: Secondary | ICD-10-CM | POA: Diagnosis not present

## 2024-04-04 DIAGNOSIS — E221 Hyperprolactinemia: Secondary | ICD-10-CM | POA: Diagnosis not present

## 2024-04-04 DIAGNOSIS — E1169 Type 2 diabetes mellitus with other specified complication: Secondary | ICD-10-CM | POA: Diagnosis not present

## 2024-04-04 DIAGNOSIS — I152 Hypertension secondary to endocrine disorders: Secondary | ICD-10-CM | POA: Diagnosis not present

## 2024-04-04 DIAGNOSIS — N186 End stage renal disease: Secondary | ICD-10-CM | POA: Diagnosis not present

## 2024-04-05 DIAGNOSIS — N186 End stage renal disease: Secondary | ICD-10-CM | POA: Diagnosis not present

## 2024-04-05 DIAGNOSIS — Z992 Dependence on renal dialysis: Secondary | ICD-10-CM | POA: Diagnosis not present

## 2024-04-07 DIAGNOSIS — Z992 Dependence on renal dialysis: Secondary | ICD-10-CM | POA: Diagnosis not present

## 2024-04-07 DIAGNOSIS — N186 End stage renal disease: Secondary | ICD-10-CM | POA: Diagnosis not present

## 2024-04-10 ENCOUNTER — Other Ambulatory Visit (INDEPENDENT_AMBULATORY_CARE_PROVIDER_SITE_OTHER): Payer: Self-pay | Admitting: Vascular Surgery

## 2024-04-10 DIAGNOSIS — Z992 Dependence on renal dialysis: Secondary | ICD-10-CM | POA: Diagnosis not present

## 2024-04-10 DIAGNOSIS — N186 End stage renal disease: Secondary | ICD-10-CM | POA: Diagnosis not present

## 2024-04-12 ENCOUNTER — Telehealth (INDEPENDENT_AMBULATORY_CARE_PROVIDER_SITE_OTHER): Payer: Self-pay

## 2024-04-12 DIAGNOSIS — N186 End stage renal disease: Secondary | ICD-10-CM | POA: Diagnosis not present

## 2024-04-12 DIAGNOSIS — Z992 Dependence on renal dialysis: Secondary | ICD-10-CM | POA: Diagnosis not present

## 2024-04-12 NOTE — Telephone Encounter (Addendum)
 I attempted to contact the patient to schedule his right arm fistulagram with Dr. Jama. A message was left for a return call.

## 2024-04-12 NOTE — Telephone Encounter (Signed)
 Spoke with the patient and he is scheduled with Dr. Jama for a right arm fistulagram on 04/17/24 with a 2:00 pm arrival time to the [facility]. Pre-procedure instructions were discussed and will be mailed.

## 2024-04-13 DIAGNOSIS — Z992 Dependence on renal dialysis: Secondary | ICD-10-CM | POA: Diagnosis not present

## 2024-04-13 DIAGNOSIS — N186 End stage renal disease: Secondary | ICD-10-CM | POA: Diagnosis not present

## 2024-04-14 DIAGNOSIS — N186 End stage renal disease: Secondary | ICD-10-CM | POA: Diagnosis not present

## 2024-04-14 DIAGNOSIS — Z992 Dependence on renal dialysis: Secondary | ICD-10-CM | POA: Diagnosis not present

## 2024-04-16 ENCOUNTER — Encounter: Payer: Self-pay | Admitting: *Deleted

## 2024-04-16 DIAGNOSIS — Z992 Dependence on renal dialysis: Secondary | ICD-10-CM | POA: Diagnosis not present

## 2024-04-16 DIAGNOSIS — N186 End stage renal disease: Secondary | ICD-10-CM | POA: Diagnosis not present

## 2024-04-17 ENCOUNTER — Encounter: Payer: Self-pay | Admitting: Vascular Surgery

## 2024-04-17 ENCOUNTER — Other Ambulatory Visit: Payer: Self-pay

## 2024-04-17 ENCOUNTER — Ambulatory Visit
Admission: RE | Admit: 2024-04-17 | Discharge: 2024-04-17 | Disposition: A | Discharge status: Home / Self Care | Attending: Vascular Surgery | Admitting: Vascular Surgery

## 2024-04-17 ENCOUNTER — Encounter
Admission: RE | Disposition: A | Discharge status: Home / Self Care | Payer: Self-pay | Source: Home / Self Care | Attending: Vascular Surgery

## 2024-04-17 DIAGNOSIS — T82898A Other specified complication of vascular prosthetic devices, implants and grafts, initial encounter: Secondary | ICD-10-CM | POA: Diagnosis not present

## 2024-04-17 DIAGNOSIS — I120 Hypertensive chronic kidney disease with stage 5 chronic kidney disease or end stage renal disease: Secondary | ICD-10-CM | POA: Diagnosis not present

## 2024-04-17 DIAGNOSIS — I1A0 Resistant hypertension: Secondary | ICD-10-CM | POA: Insufficient documentation

## 2024-04-17 DIAGNOSIS — Z794 Long term (current) use of insulin: Secondary | ICD-10-CM | POA: Insufficient documentation

## 2024-04-17 DIAGNOSIS — Y832 Surgical operation with anastomosis, bypass or graft as the cause of abnormal reaction of the patient, or of later complication, without mention of misadventure at the time of the procedure: Secondary | ICD-10-CM | POA: Diagnosis not present

## 2024-04-17 DIAGNOSIS — E1122 Type 2 diabetes mellitus with diabetic chronic kidney disease: Secondary | ICD-10-CM | POA: Insufficient documentation

## 2024-04-17 DIAGNOSIS — Z992 Dependence on renal dialysis: Secondary | ICD-10-CM

## 2024-04-17 DIAGNOSIS — Z87891 Personal history of nicotine dependence: Secondary | ICD-10-CM | POA: Insufficient documentation

## 2024-04-17 DIAGNOSIS — Z79899 Other long term (current) drug therapy: Secondary | ICD-10-CM | POA: Insufficient documentation

## 2024-04-17 DIAGNOSIS — Z7985 Long-term (current) use of injectable non-insulin antidiabetic drugs: Secondary | ICD-10-CM | POA: Diagnosis not present

## 2024-04-17 DIAGNOSIS — N186 End stage renal disease: Secondary | ICD-10-CM

## 2024-04-17 HISTORY — PX: A/V FISTULAGRAM: CATH118298

## 2024-04-17 LAB — POTASSIUM (ARMC VASCULAR LAB ONLY): Potassium (ARMC vascular lab): 4.9 mmol/L (ref 3.5–5.1)

## 2024-04-17 LAB — GLUCOSE, CAPILLARY
Glucose-Capillary: 65 mg/dL — ABNORMAL LOW (ref 70–99)
Glucose-Capillary: 95 mg/dL (ref 70–99)

## 2024-04-17 SURGERY — A/V FISTULAGRAM
Anesthesia: Moderate Sedation | Laterality: Right

## 2024-04-17 MED ORDER — HYDROMORPHONE HCL 1 MG/ML IJ SOLN: 1.0000 mg | Freq: Once | INTRAMUSCULAR | Status: DC | PRN

## 2024-04-17 MED ORDER — DIPHENHYDRAMINE HCL 50 MG/ML IJ SOLN: 50.0000 mg | Freq: Once | INTRAMUSCULAR | Status: DC | PRN

## 2024-04-17 MED ORDER — HEPARIN (PORCINE) IN NACL 1000-0.9 UT/500ML-% IV SOLN
INTRAVENOUS | Status: DC | PRN
  Administered 2024-04-17: 500 mL

## 2024-04-17 MED ORDER — FAMOTIDINE 20 MG PO TABS: 40.0000 mg | ORAL_TABLET | Freq: Once | ORAL | Status: DC | PRN

## 2024-04-17 MED ORDER — IODIXANOL 320 MG/ML IV SOLN
INTRAVENOUS | Status: DC | PRN
  Administered 2024-04-17: 30 mL

## 2024-04-17 MED ORDER — MIDAZOLAM HCL 2 MG/2ML IJ SOLN
INTRAMUSCULAR | Status: DC | PRN
  Administered 2024-04-17: 1 mg via INTRAVENOUS
  Administered 2024-04-17: 2 mg via INTRAVENOUS

## 2024-04-17 MED ORDER — CEFAZOLIN SODIUM-DEXTROSE 1-4 GM/50ML-% IV SOLN
1.0000 g | INTRAVENOUS | Status: AC
  Administered 2024-04-17: 1 g via INTRAVENOUS

## 2024-04-17 MED ORDER — MIDAZOLAM HCL 5 MG/5ML IJ SOLN
INTRAMUSCULAR | Status: AC
  Filled 2024-04-17: qty 5

## 2024-04-17 MED ORDER — CEFAZOLIN SODIUM-DEXTROSE 1-4 GM/50ML-% IV SOLN
INTRAVENOUS | Status: AC
  Filled 2024-04-17: qty 50

## 2024-04-17 MED ORDER — METHYLPREDNISOLONE SODIUM SUCC 125 MG IJ SOLR: 125.0000 mg | Freq: Once | INTRAMUSCULAR | Status: DC | PRN

## 2024-04-17 MED ORDER — FENTANYL CITRATE (PF) 100 MCG/2ML IJ SOLN
INTRAMUSCULAR | Status: AC
  Filled 2024-04-17: qty 2

## 2024-04-17 MED ORDER — FENTANYL CITRATE (PF) 100 MCG/2ML IJ SOLN
INTRAMUSCULAR | Status: DC | PRN
  Administered 2024-04-17: 25 ug via INTRAVENOUS
  Administered 2024-04-17: 50 ug via INTRAVENOUS

## 2024-04-17 MED ORDER — DEXTROSE 50 % IV SOLN
25.0000 g | Freq: Once | INTRAVENOUS | Status: AC
  Administered 2024-04-17: 25 g via INTRAVENOUS

## 2024-04-17 MED ORDER — ONDANSETRON HCL 4 MG/2ML IJ SOLN: 4.0000 mg | Freq: Four times a day (QID) | INTRAMUSCULAR | Status: DC | PRN

## 2024-04-17 MED ORDER — MIDAZOLAM HCL 2 MG/ML PO SYRP: 8.0000 mg | ORAL_SOLUTION | Freq: Once | ORAL | Status: DC | PRN

## 2024-04-17 MED ORDER — SODIUM CHLORIDE 0.9 % IV SOLN: INTRAVENOUS | Status: DC

## 2024-04-17 MED ORDER — DEXTROSE 50 % IV SOLN
INTRAVENOUS | Status: AC
  Filled 2024-04-17: qty 50

## 2024-04-17 SURGICAL SUPPLY — 9 items
COVER PROBE ULTRASOUND 5X96 (MISCELLANEOUS) IMPLANT
DRAPE BRACHIAL (DRAPES) IMPLANT
GOWN STRL REUS W/ TWL LRG LVL3 (GOWN DISPOSABLE) ×1 IMPLANT
NDL ENTRY 21GA 7CM ECHOTIP (NEEDLE) IMPLANT
NEEDLE ENTRY 21GA 7CM ECHOTIP (NEEDLE) ×1 IMPLANT
PACK ANGIOGRAPHY (CUSTOM PROCEDURE TRAY) ×1 IMPLANT
SET INTRO CAPELLA COAXIAL (SET/KITS/TRAYS/PACK) IMPLANT
SHEATH BRITE TIP 6FRX5.5 (SHEATH) IMPLANT
SUT MNCRL AB 4-0 PS2 18 (SUTURE) IMPLANT

## 2024-04-17 NOTE — Discharge Instructions (Signed)
 Shuntogram, Care After Refer to this sheet in the next few weeks. These instructions provide you with information on caring for yourself after your procedure. Your health care provider may also give you more specific instructions. Your treatment has been planned according to current medical practices, but problems sometimes occur. Call your health care provider if you have any problems or questions after your procedure. What can I expect after the procedure? After your procedure, it is typical to have the following:  A small amount of discomfort in the area where the catheters were placed.  A small amount of bruising around the fistula.  Sleepiness and fatigue.  Follow these instructions at home:  Rest at home for the day following your procedure.  Do not drive or operate heavy machinery while taking pain medicine.  Take medicines only as directed by your health care provider.  Do not take baths, swim, or use a hot tub until your health care provider approves. You may shower 24 hours after the procedure or as directed by your health care provider.  There are many different ways to close and cover an incision, including stitches, skin glue, and adhesive strips. Follow your health care provider's instructions on: ? Incision care. ? Bandage (dressing) changes and removal. ? Incision closure removal.  Monitor your dialysis fistula carefully. Contact a health care provider if:  You have drainage, redness, swelling, or pain at your catheter site.  You have a fever.  You have chills. Get help right away if:  You feel weak.  You have trouble balancing.  You have trouble moving your arms or legs.  You have problems with your speech or vision.  You can no longer feel a vibration or buzz when you put your fingers over your dialysis fistula.  The limb that was used for the procedure: ? Swells. ? Is painful. ? Is cold. ? Is discolored, such as blue or pale white. This  information is not intended to replace advice given to you by your health care provider. Make sure you discuss any questions you have with your health care provider. Document Released: 12/31/2013 Document Revised: 01/22/2016 Document Reviewed: 10/05/2013 Elsevier Interactive Patient Education  2018 [employer].

## 2024-04-17 NOTE — Interval H&P Note (Signed)
 History and Physical Interval Note:  04/17/2024 1:54 PM  [name]  has presented today for surgery, with the diagnosis of R Arm Fistulagram   End Stage Renal.  The various methods of treatment have been discussed with the patient and family. After consideration of risks, benefits and other options for treatment, the patient has consented to  Procedure(s): A/V Fistulagram (Right) as a surgical intervention.  The patient's history has been reviewed, patient examined, no change in status, stable for surgery.  I have reviewed the patient's chart and labs.  Questions were answered to the patient's satisfaction.     Cordella Shawl

## 2024-04-17 NOTE — Op Note (Signed)
 OPERATIVE NOTE   PROCEDURE: Contrast injection right forearm loop graft.  PRE-OPERATIVE DIAGNOSIS: Complication of dialysis access                                                       End Stage Renal Disease  POST-OPERATIVE DIAGNOSIS: same as above   SURGEON: Cordella JUDITHANN Shawl, M.D.  ANESTHESIA: Conscious sedation was administered under my direct supervision by the interventional radiology RN.  IV Versed  plus fentanyl  were utilized. Continuous ECG, pulse oximetry and blood pressure was monitored throughout the entire procedure.  Conscious sedation was for a total of 21 minutes.  ESTIMATED BLOOD LOSS: minimal  FINDING(S): Graft is widely patent both arterial and venous anastomoses are widely patent  SPECIMEN(S):  None  CONTRAST: 30 cc  FLUOROSCOPY TIME: 0.6 minutes  INDICATIONS: [name] is a 53 y.o. male who  presents with malfunctioning right forearm AV access.  The patient is scheduled for angiography with possible intervention of the AV access to prevent loss of the permanent access.  The patient is aware the risks include but are not limited to: bleeding, infection, thrombosis of the cannulated access, and possible anaphylactic reaction to the contrast.  The patient acknowledges if the access can not be salvaged a tunneled catheter will be needed and will be placed during this procedure.  The patient is aware of the risks of the procedure and elects to proceed with the angiogram and intervention.  DESCRIPTION: After full informed written consent was obtained, the patient was brought back to the Special Procedure suite and placed supine position.  Appropriate cardiopulmonary monitors were placed.  The right arm was prepped and draped in the standard fashion.  Appropriate timeout is called.   The right forearm loop graft was cannulated with a micropuncture needle under ultrasound guidence.  Ultrasound was used to evaluate the loop access.  It was echolucent and compressible  indicating it is patent .  An ultrasound image was acquired for the permanent record.  A micropuncture needle was used to access the loop graft access under direct ultrasound guidance.  The microwire was then advanced under fluoroscopic guidance without difficulty followed by the micro-sheath.  The J-wire was then advanced and a 6 Fr sheath inserted.  Hand injections were completed to image the access from the arterial anastomosis through the entire access.  The central venous structures were also imaged by hand injections.  Based on the images, no intervention is performed.  The arterial and venous anastomoses are widely patent.  Venous outflow is widely patent.  Graft has a smooth contour.  There is mild narrowing in the central venous area.    A 4-0 Monocryl purse-string suture was sewn around the sheath.  The sheath was removed and light pressure was applied.  A sterile bandage was applied to the puncture site.    COMPLICATIONS: None   CONDITION: Metta Cordella JUDITHANN Shawl, M.D [facility] Office: [phone]  04/17/2024 3:35 PM

## 2024-04-18 ENCOUNTER — Encounter: Payer: Self-pay | Admitting: Vascular Surgery

## 2024-04-18 MED ORDER — LIDOCAINE HCL (PF) 1 % IJ SOLN
INTRAMUSCULAR | Status: AC | PRN
Start: 2024-04-17 — End: ?
  Administered 2024-04-17: 2 mL

## 2024-04-19 DIAGNOSIS — N186 End stage renal disease: Secondary | ICD-10-CM | POA: Diagnosis not present

## 2024-04-19 DIAGNOSIS — Z992 Dependence on renal dialysis: Secondary | ICD-10-CM | POA: Diagnosis not present

## 2024-04-21 DIAGNOSIS — Z992 Dependence on renal dialysis: Secondary | ICD-10-CM | POA: Diagnosis not present

## 2024-04-21 DIAGNOSIS — N186 End stage renal disease: Secondary | ICD-10-CM | POA: Diagnosis not present

## 2024-04-24 DIAGNOSIS — N186 End stage renal disease: Secondary | ICD-10-CM | POA: Diagnosis not present

## 2024-04-24 DIAGNOSIS — Z992 Dependence on renal dialysis: Secondary | ICD-10-CM | POA: Diagnosis not present

## 2024-04-26 DIAGNOSIS — Z992 Dependence on renal dialysis: Secondary | ICD-10-CM | POA: Diagnosis not present

## 2024-04-26 DIAGNOSIS — N186 End stage renal disease: Secondary | ICD-10-CM | POA: Diagnosis not present

## 2024-04-28 DIAGNOSIS — N186 End stage renal disease: Secondary | ICD-10-CM | POA: Diagnosis not present

## 2024-04-28 DIAGNOSIS — Z992 Dependence on renal dialysis: Secondary | ICD-10-CM | POA: Diagnosis not present

## 2024-04-29 DIAGNOSIS — N186 End stage renal disease: Secondary | ICD-10-CM | POA: Diagnosis not present

## 2024-04-29 DIAGNOSIS — Z992 Dependence on renal dialysis: Secondary | ICD-10-CM | POA: Diagnosis not present

## 2024-04-30 DIAGNOSIS — Z992 Dependence on renal dialysis: Secondary | ICD-10-CM | POA: Diagnosis not present

## 2024-04-30 DIAGNOSIS — N186 End stage renal disease: Secondary | ICD-10-CM | POA: Diagnosis not present

## 2024-05-01 DIAGNOSIS — Z992 Dependence on renal dialysis: Secondary | ICD-10-CM | POA: Diagnosis not present

## 2024-05-01 DIAGNOSIS — N186 End stage renal disease: Secondary | ICD-10-CM | POA: Diagnosis not present

## 2024-05-03 DIAGNOSIS — N186 End stage renal disease: Secondary | ICD-10-CM | POA: Diagnosis not present

## 2024-05-03 DIAGNOSIS — Z992 Dependence on renal dialysis: Secondary | ICD-10-CM | POA: Diagnosis not present

## 2024-05-04 ENCOUNTER — Encounter (INDEPENDENT_AMBULATORY_CARE_PROVIDER_SITE_OTHER): Payer: Self-pay | Admitting: Nurse Practitioner

## 2024-05-04 ENCOUNTER — Ambulatory Visit (INDEPENDENT_AMBULATORY_CARE_PROVIDER_SITE_OTHER): Admitting: Nurse Practitioner

## 2024-05-04 VITALS — BP 135/81 | HR 74

## 2024-05-04 DIAGNOSIS — N186 End stage renal disease: Secondary | ICD-10-CM

## 2024-05-04 DIAGNOSIS — I10 Essential (primary) hypertension: Secondary | ICD-10-CM

## 2024-05-05 DIAGNOSIS — N186 End stage renal disease: Secondary | ICD-10-CM | POA: Diagnosis not present

## 2024-05-05 DIAGNOSIS — Z992 Dependence on renal dialysis: Secondary | ICD-10-CM | POA: Diagnosis not present

## 2024-05-06 NOTE — Progress Notes (Signed)
 Subjective:    Patient ID: [name], male    DOB: Jan 07, 1971, 53 y.o.   MRN: [id number] Chief Complaint  Patient presents with   Follow-up    The patient presents today after recent fistulogram.  Fistulogram was done because it was noted that he had a low flow volume post graft placement.  The fistulogram noted no significant stenosis.    Review of Systems  All other systems reviewed and are negative.      Objective:   Physical Exam Vitals reviewed.  HENT:     Head: Normocephalic.  Cardiovascular:     Rate and Rhythm: Normal rate.     Pulses: Normal pulses.  Pulmonary:     Effort: Pulmonary effort is normal.  Skin:    General: Skin is warm and dry.  Neurological:     Mental Status: He is alert and oriented to person, place, and time.  Psychiatric:        Mood and Affect: Mood normal.        Behavior: Behavior normal.        Thought Content: Thought content normal.        Judgment: Judgment normal.     BP 135/81   Pulse 74   Past Medical History:  Diagnosis Date   Anemia    Chronic kidney disease    14% FUNCTION   Diabetes mellitus without complication (HCC)    Diabetic retinopathy (HCC)    Dyspnea    DOE   Dysrhythmia    End stage renal disease (HCC)    History of orthopnea    error wrong patietn   Hypercholesteremia    error   Hyperkalemia    error wrong patient   Hypertension    Long-term insulin  use (HCC)    Lymphedema of leg    Metabolic encephalopathy    error   Microalbuminuria    MSSA (methicillin susceptible Staphylococcus aureus)    Obesity    PVD (peripheral vascular disease) (HCC)    Respiratory failure (HCC)    Error   Sepsis (HCC)    error   Sleep apnea    no CPAP   UTI (urinary tract infection)    error   Venous ulcer (HCC)    Vitamin D deficiency     Social History   Socioeconomic History   Marital status: Divorced    Spouse name: Not on file   Number of children: 2   Years of education: Not on file   Highest  education level: Not on file  Occupational History   Not on file  Tobacco Use   Smoking status: Former    Types: Cigars    Passive exposure: Past   Smokeless tobacco: Former    Quit date: 09/30/2017   Tobacco comments:    occasional  Vaping Use   Vaping status: Never Used  Substance and Sexual Activity   Alcohol  use: Yes    Alcohol /week: 1.0 standard drink of alcohol     Types: 1 Cans of beer per week    Comment: beer   Drug use: Not Currently   Sexual activity: Yes  Other Topics Concern   Not on file  Social History Narrative   Lives alone.  Significant other, Debbie. 2 grown kids    Social Drivers of Health   Financial Resource Strain: Medium Risk (01/02/2024)   Received from [facility]   Overall Financial Resource Strain (CARDIA)    Difficulty of Paying Living Expenses:  Somewhat hard  Food Insecurity: No Food Insecurity (02/08/2024)   Hunger Vital Sign    Worried About Running Out of Food in the Last Year: Never true    Ran Out of Food in the Last Year: Never true  Transportation Needs: No Transportation Needs (02/08/2024)   PRAPARE - Administrator, Civil Service (Medical): No    Lack of Transportation (Non-Medical): No  Physical Activity: Not on file  Stress: Not on file  Social Connections: Socially Integrated (02/08/2024)   Social Connection and Isolation Panel    Frequency of Communication with Friends and Family: Three times a week    Frequency of Social Gatherings with Friends and Family: Three times a week    Attends Religious Services: 1 to 4 times per year    Active Member of Clubs or Organizations: No    Attends Banker Meetings: 1 to 4 times per year    Marital Status: Living with partner  Intimate Partner Violence: Not At Risk (02/08/2024)   Humiliation, Afraid, Rape, and Kick questionnaire    Fear of Current or Ex-Partner: No    Emotionally Abused: No    Physically Abused: No    Sexually Abused: No    Past  Surgical History:  Procedure Laterality Date   A/V FISTULAGRAM Right 10/24/2018   Procedure: A/V FISTULAGRAM;  Surgeon: Jama Cordella MATSU, MD;  Location: [facility];  Service: Cardiovascular;  Laterality: Right;   A/V FISTULAGRAM Right 11/24/2018   Procedure: A/V FISTULAGRAM;  Surgeon: Jama Cordella MATSU, MD;  Location: [facility];  Service: Cardiovascular;  Laterality: Right;   A/V FISTULAGRAM Left 07/31/2019   Procedure: A/V FISTULAGRAM;  Surgeon: Jama Cordella MATSU, MD;  Location: [facility];  Service: Cardiovascular;  Laterality: Left;   A/V FISTULAGRAM Left 12/11/2019   Procedure: A/V FISTULAGRAM;  Surgeon: Jama Cordella MATSU, MD;  Location: [facility];  Service: Cardiovascular;  Laterality: Left;   A/V FISTULAGRAM Left 07/08/2023   Procedure: A/V Fistulagram;  Surgeon: Jama Cordella MATSU, MD;  Location: [facility];  Service: Cardiovascular;  Laterality: Left;   A/V FISTULAGRAM Left 09/16/2023   Procedure: A/V Fistulagram;  Surgeon: Jama Cordella MATSU, MD;  Location: [facility];  Service: Cardiovascular;  Laterality: Left;   A/V FISTULAGRAM Left 12/15/2023   Procedure: A/V Fistulagram;  Surgeon: Marea Selinda RAMAN, MD;  Location: [facility];  Service: Cardiovascular;  Laterality: Left;   A/V FISTULAGRAM Right 04/17/2024   Procedure: A/V Fistulagram;  Surgeon: Jama Cordella MATSU, MD;  Location: [facility];  Service: Cardiovascular;  Laterality: Right;   A/V SHUNT INTERVENTION Right 02/21/2019   Procedure: A/V SHUNT INTERVENTION;  Surgeon: Jama Cordella MATSU, MD;  Location: [facility];  Service: Cardiovascular;  Laterality: Right;   APPLICATION OF WOUND VAC Left 10/03/2017   Procedure: APPLICATION OF WOUND VAC;  Surgeon: Marea Selinda RAMAN, MD;  Location: [facility];  Service: General;  Laterality: Left;   APPLICATION OF WOUND VAC Left 10/11/2017   Procedure: APPLICATION OF WOUND VAC;  Surgeon: Jama Cordella MATSU, MD;   Location: [facility];  Service: Vascular;  Laterality: Left;   APPLICATION OF WOUND VAC Left 10/14/2017   Procedure: WOUND VAC CHANGE;  Surgeon: Jama Cordella MATSU, MD;  Location: [facility];  Service: Vascular;  Laterality: Left;  left lower leg   APPLICATION OF WOUND VAC Left 10/18/2017   Procedure: WOUND VAC CHANGE;  Surgeon: Jama Cordella MATSU,  MD;  Location: [facility];  Service: Vascular;  Laterality: Left;   APPLICATION OF WOUND VAC Left 10/07/2017   Procedure: APPLICATION OF WOUND VAC;  Surgeon: Jama Cordella MATSU, MD;  Location: [facility];  Service: Vascular;  Laterality: Left;   APPLICATION OF WOUND VAC Left 11/17/2022   Procedure: Wound vac exchange;  Surgeon: Jama Cordella MATSU, MD;  Location: [facility];  Service: Vascular;  Laterality: Left;   APPLICATION OF WOUND VAC Left 11/10/2022   Procedure: APPLICATION OF WOUND VAC;  Surgeon: Jama Cordella MATSU, MD;  Location: [facility];  Service: Vascular;  Laterality: Left;   APPLICATION OF WOUND VAC Left 11/24/2022   Procedure: WOUND VAC REMOVAL;  Surgeon: Jama Cordella MATSU, MD;  Location: [facility];  Service: Vascular;  Laterality: Left;   APPLICATION OF WOUND VAC Left 11/12/2022   Procedure: APPLICATION OF WOUND VAC;  Surgeon: Jama Cordella MATSU, MD;  Location: [facility];  Service: Vascular;  Laterality: Left;   APPLICATION OF WOUND VAC Left 01/06/2023   Procedure: APPLICATION OF WOUND VAC;  Surgeon: Jama Cordella MATSU, MD;  Location: [facility];  Service: Vascular;  Laterality: Left;   AV FISTULA INSERTION W/ RF MAGNETIC GUIDANCE Right 07/19/2018   Procedure: AV FISTULA INSERTION W/RF MAGNETIC GUIDANCE;  Surgeon: Jama Cordella MATSU, MD;  Location: [facility];  Service: Cardiovascular;  Laterality: Right;   AV FISTULA PLACEMENT Left 05/11/2019   Procedure: ARTERIOVENOUS (AV) FISTULA CREATION (RADIOCEPHALIC );  Surgeon: Jama Cordella MATSU, MD;  Location: [facility];  Service: Vascular;  Laterality: Left;   AV FISTULA PLACEMENT Left 02/20/2020    Procedure: INSERTION OF ARTERIOVENOUS (AV) GORE-TEX GRAFT LEFT ARM ( FOREARM LOOP );  Surgeon: Jama Cordella MATSU, MD;  Location: [facility];  Service: Vascular;  Laterality: Left;   CATARACT EXTRACTION W/PHACO Left 11/01/2018   Procedure: CATARACT EXTRACTION PHACO AND INTRAOCULAR LENS PLACEMENT (IOC)-LEFT, DIABETIC-INSULIN  DEPENDENT;  Surgeon: Myrna Adine Anes, MD;  Location: [facility];  Service: Ophthalmology;  Laterality: Left;  US  00:41.8 CDE 4.61 Fluid Pack Lot # [id number]   CATARACT EXTRACTION W/PHACO Right 04/27/2019   Procedure: CATARACT EXTRACTION PHACO AND INTRAOCULAR LENS PLACEMENT (IOC);  Surgeon: Myrna Adine Anes, MD;  Location: [facility];  Service: Ophthalmology;  Laterality: Right;  US  00:34 CDE 1.97 FLUID PACK LOT # [id number]    COLONOSCOPY WITH PROPOFOL  N/A 05/12/2020   Procedure: COLONOSCOPY WITH PROPOFOL ;  Surgeon: Toledo, Ladell POUR, MD;  Location: [facility];  Service: Gastroenterology;  Laterality: N/A;   DIALYSIS/PERMA CATHETER INSERTION N/A 02/23/2019   Procedure: DIALYSIS/PERMA CATHETER INSERTION;  Surgeon: Marea Selinda RAMAN, MD;  Location: [facility];  Service: Cardiovascular;  Laterality: N/A;   DIALYSIS/PERMA CATHETER INSERTION Right 12/15/2023   Procedure: DIALYSIS/PERMA CATHETER INSERTION;  Surgeon: Marea Selinda RAMAN, MD;  Location: [facility];  Service: Cardiovascular;  Laterality: Right;   DIALYSIS/PERMA CATHETER REMOVAL N/A 04/03/2020   Procedure: DIALYSIS/PERMA CATHETER REMOVAL;  Surgeon: Jama Cordella MATSU, MD;  Location: [facility];  Service: Cardiovascular;  Laterality: N/A;   DIALYSIS/PERMA CATHETER REMOVAL N/A 02/03/2022   Procedure: DIALYSIS/PERMA CATHETER REMOVAL;  Surgeon: Jama Cordella MATSU, MD;  Location: [facility];  Service: Cardiovascular;  Laterality: N/A;   I & D EXTREMITY Left 10/11/2017   Procedure: IRRIGATION AND DEBRIDEMENT EXTREMITY;  Surgeon: Jama Cordella MATSU, MD;  Location: [facility];  Service: Vascular;  Laterality:  Left;   INCISION AND DRAINAGE ABSCESS Right 10/07/2017   Procedure: INCISION AND DRAINAGE ABSCESS;  Surgeon: Jama Cordella MATSU,  MD;  Location: [facility];  Service: Vascular;  Laterality: Right;   INCISION AND DRAINAGE OF WOUND Left 11/17/2022   Procedure: DEBRIDEMENT WOUND;  Surgeon: Jama Cordella MATSU, MD;  Location: [facility];  Service: Vascular;  Laterality: Left;   INCISION AND DRAINAGE OF WOUND Left 11/24/2022   Procedure: DEBRIDEMENT WOUND LEFT LOWER EXTREMITY;  Surgeon: Jama Cordella MATSU, MD;  Location: [facility];  Service: Vascular;  Laterality: Left;   INSERTION OF ARTERIOVENOUS (AV) ARTEGRAFT ARM Right 03/14/2024   Procedure: INSERTION, GRAFT, ARTERIOVENOUS, UPPER EXTREMITY;  Surgeon: Jama Cordella MATSU, MD;  Location: [facility];  Service: Vascular;  Laterality: Right;   IRRIGATION AND DEBRIDEMENT ABSCESS Left 10/03/2017   Procedure: IRRIGATION AND DEBRIDEMENT ABSCESS with debridement of skin, soft tissue, muscle 50sq cm;  Surgeon: Marea Selinda RAMAN, MD;  Location: [facility];  Service: General;  Laterality: Left;   LOWER EXTREMITY ANGIOGRAPHY Left 10/15/2022   Procedure: Lower Extremity Angiography;  Surgeon: Jama Cordella MATSU, MD;  Location: [facility];  Service: Cardiovascular;  Laterality: Left;   TEE WITHOUT CARDIOVERSION N/A 03/29/2023   Procedure: TRANSESOPHAGEAL ECHOCARDIOGRAM;  Surgeon: Perla Evalene PARAS, MD;  Location: [facility];  Service: Cardiovascular;  Laterality: N/A;   TEMPORARY DIALYSIS CATHETER  02/20/2019   Procedure: TEMPORARY DIALYSIS CATHETER;  Surgeon: Jama Cordella MATSU, MD;  Location: [facility];  Service: Cardiovascular;;   TEMPORARY DIALYSIS CATHETER N/A 12/13/2023   Procedure: TEMPORARY DIALYSIS CATHETER;  Surgeon: Jama Cordella MATSU, MD;  Location: [facility];  Service: Cardiovascular;  Laterality: N/A;   UPPER EXTREMITY ANGIOGRAPHY Left 09/18/2019   Procedure: UPPER EXTREMITY ANGIOGRAPHY;  Surgeon: Jama Cordella MATSU, MD;  Location: [facility];  Service: Cardiovascular;  Laterality: Left;   WOUND DEBRIDEMENT Left 11/10/2022   Procedure: DEBRIDEMENT WOUND;  Surgeon: Jama Cordella MATSU, MD;  Location: [facility];  Service: Vascular;  Laterality: Left;   WOUND DEBRIDEMENT Left 11/12/2022   Procedure: DEBRIDEMENT WOUND;  Surgeon: Jama Cordella MATSU, MD;  Location: [facility];  Service: Vascular;  Laterality: Left;   WOUND DEBRIDEMENT Left 01/06/2023   Procedure: DEBRIDEMENT WOUND;  Surgeon: Jama Cordella MATSU, MD;  Location: [facility];  Service: Vascular;  Laterality: Left;    Family History  Problem Relation Age of Onset   Diabetes Father    Kidney disease Father    Diabetes Daughter     Allergies  Allergen Reactions   Lisinopril Swelling    Lips mouth    Furosemide  Cough    Light headed, blurry vision, weakness.   Latex Itching   Silicone Itching   Adhesive [Tape] Itching and Rash       Latest Ref Rng & Units 03/14/2024   11:25 AM 02/15/2024    8:34 AM 12/16/2023    4:22 AM  CBC  WBC 4.0 - 10.5 K/uL  6.1  5.8   Hemoglobin 13.0 - 17.0 g/dL 83.2  86.3  86.6   Hematocrit 39.0 - 52.0 % 49.0  41.1  39.7   Platelets 150 - 400 K/uL  168  102       CMP     Component Value Date/Time   NA 139 03/14/2024 1125   K 5.2 (H) 03/14/2024 1125   CL 103 03/14/2024 1125   CO2 24 02/15/2024 0834   GLUCOSE 89 03/14/2024 1125   BUN 50 (H) 03/14/2024 1125   CREATININE 14.60 (H) 03/14/2024 1125   CALCIUM  9.1 02/15/2024 0834   PROT 7.5 12/12/2023 1518   ALBUMIN 3.9  12/12/2023 1518   AST 9 (L) 12/12/2023 1518   ALT 13 12/12/2023 1518   ALKPHOS 51 12/12/2023 1518   BILITOT 1.0 12/12/2023 1518   GFRNONAA 4 (L) 02/15/2024 0834     No results found.     Assessment & Plan:   1. ESRD (end stage renal disease) (HCC) (Primary) The patient's fistulogram showed no evidence of significant stenosis which is mild central venous stenosis.  Based on that the patient should be adequate to utilize his fistula at this time CSL the graft  has been in place for least 4 weeks.  Wound will authorize its use and have him return in 368 weeks for repeat studies.  2. Primary hypertension Continue antihypertensive medications as already ordered, these medications have been reviewed and there are no changes at this time.   Current Outpatient Medications on File Prior to Visit  Medication Sig Dispense Refill   ACCU-CHEK GUIDE test strip USE 1 STRIP TOTAL 2 TIMES DAILY FOR 90 DAYS     acetaminophen  (TYLENOL ) 325 MG tablet Take 2 tablets (650 mg total) by mouth every 6 (six) hours as needed for mild pain (or Fever >/= 101).     allopurinol  (ZYLOPRIM ) 100 MG tablet Take 100 mg by mouth daily.     amLODipine  (NORVASC ) 10 MG tablet Take 5 mg by mouth daily as needed (if bp is 190/90 or higher).  (Patient not taking: Reported on 04/17/2024)     atorvastatin  (LIPITOR) 80 MG tablet Take 80 mg by mouth at bedtime.     B Complex-C-Folic Acid  (RENA-VITE RX) 1 MG TABS Take 1 tablet by mouth 3 (three) times a week.      calcium  acetate (PHOSLO ) 667 MG capsule Take 1,334-2,001 mg by mouth See admin instructions. Take 2001 mg by mouth with each meal & take 1334 mg by mouth with each snack.     cinacalcet  (SENSIPAR ) 30 MG tablet Take 60 mg by mouth daily.     CINNAMON  PO Take 500 mg by mouth 2 (two) times daily.      clopidogrel  (PLAVIX ) 75 MG tablet Take 1 tablet (75 mg total) by mouth daily. (Patient not taking: Reported on 04/17/2024) 30 tablet 0   Continuous Glucose Sensor (DEXCOM G7 SENSOR) MISC USE 1 EACH EVERY 10 (TEN) DAYS     ELIQUIS  5 MG TABS tablet TAKE 1 TABLET BY MOUTH 2 TIMES DAILY. PLEASE RESTART YOUR ELIQUIS  WITH TOMORROW MORNING'S DOSE 60 tablet 0   HYDROcodone -acetaminophen  (NORCO/VICODIN) 5-325 MG tablet Take 1-2 tablets by mouth every 6 (six) hours as needed for moderate pain (pain score 4-6) or severe pain (pain score 7-10). 36 tablet 0   Insulin  Aspart FlexPen (NOVOLOG ) 100 UNIT/ML Inject 5 Units subcutaneously 3 (three) times daily  with meals SKIP dose if your sugar before your meal is your sugar is below 100.     insulin  degludec (TRESIBA) 100 UNIT/ML FlexTouch Pen Inject 30 Units subcutaneously once daily     lidocaine -prilocaine  (EMLA ) cream Apply 1 application topically as needed (port access).     Multiple Vitamins-Minerals (EYE HEALTH PO) Take 1 capsule by mouth daily.      OZEMPIC, 0.25 OR 0.5 MG/DOSE, 2 MG/3ML SOPN 0.25 mg once a week.     Polyethyl Glycol-Propyl Glycol (SYSTANE OP) Place 1 drop into both eyes daily as needed (dry eyes).     Current Facility-Administered Medications on File Prior to Visit  Medication Dose Route Frequency Provider Last Rate Last Admin   lidocaine  (PF) (XYLOCAINE )  1 % injection    PRN Schnier, Cordella MATSU, MD   2 mL at 04/17/24 1515    There are no Patient Instructions on file for this visit. No follow-ups on file.   Bintou Lafata E Abbas Beyene, NP

## 2024-05-08 DIAGNOSIS — Z992 Dependence on renal dialysis: Secondary | ICD-10-CM | POA: Diagnosis not present

## 2024-05-08 DIAGNOSIS — N186 End stage renal disease: Secondary | ICD-10-CM | POA: Diagnosis not present

## 2024-05-10 ENCOUNTER — Other Ambulatory Visit: Payer: Self-pay | Admitting: Urology

## 2024-05-10 DIAGNOSIS — N529 Male erectile dysfunction, unspecified: Secondary | ICD-10-CM

## 2024-05-10 DIAGNOSIS — Z992 Dependence on renal dialysis: Secondary | ICD-10-CM | POA: Diagnosis not present

## 2024-05-10 DIAGNOSIS — N186 End stage renal disease: Secondary | ICD-10-CM | POA: Diagnosis not present

## 2024-05-11 ENCOUNTER — Other Ambulatory Visit (INDEPENDENT_AMBULATORY_CARE_PROVIDER_SITE_OTHER): Payer: Self-pay | Admitting: Vascular Surgery

## 2024-05-12 DIAGNOSIS — Z992 Dependence on renal dialysis: Secondary | ICD-10-CM | POA: Diagnosis not present

## 2024-05-12 DIAGNOSIS — N186 End stage renal disease: Secondary | ICD-10-CM | POA: Diagnosis not present

## 2024-05-15 DIAGNOSIS — Z992 Dependence on renal dialysis: Secondary | ICD-10-CM | POA: Diagnosis not present

## 2024-05-15 DIAGNOSIS — N186 End stage renal disease: Secondary | ICD-10-CM | POA: Diagnosis not present

## 2024-05-17 DIAGNOSIS — N186 End stage renal disease: Secondary | ICD-10-CM | POA: Diagnosis not present

## 2024-05-17 DIAGNOSIS — Z992 Dependence on renal dialysis: Secondary | ICD-10-CM | POA: Diagnosis not present

## 2024-05-19 DIAGNOSIS — N186 End stage renal disease: Secondary | ICD-10-CM | POA: Diagnosis not present

## 2024-05-19 DIAGNOSIS — Z992 Dependence on renal dialysis: Secondary | ICD-10-CM | POA: Diagnosis not present

## 2024-05-22 DIAGNOSIS — Z992 Dependence on renal dialysis: Secondary | ICD-10-CM | POA: Diagnosis not present

## 2024-05-22 DIAGNOSIS — N186 End stage renal disease: Secondary | ICD-10-CM | POA: Diagnosis not present

## 2024-05-23 DIAGNOSIS — T82898A Other specified complication of vascular prosthetic devices, implants and grafts, initial encounter: Secondary | ICD-10-CM | POA: Diagnosis not present

## 2024-05-23 DIAGNOSIS — Z992 Dependence on renal dialysis: Secondary | ICD-10-CM | POA: Diagnosis not present

## 2024-05-23 DIAGNOSIS — E1122 Type 2 diabetes mellitus with diabetic chronic kidney disease: Secondary | ICD-10-CM | POA: Diagnosis not present

## 2024-05-23 DIAGNOSIS — I871 Compression of vein: Secondary | ICD-10-CM | POA: Diagnosis not present

## 2024-05-23 DIAGNOSIS — M1030 Gout due to renal impairment, unspecified site: Secondary | ICD-10-CM | POA: Diagnosis not present

## 2024-05-23 DIAGNOSIS — I120 Hypertensive chronic kidney disease with stage 5 chronic kidney disease or end stage renal disease: Secondary | ICD-10-CM | POA: Diagnosis not present

## 2024-05-23 DIAGNOSIS — G4730 Sleep apnea, unspecified: Secondary | ICD-10-CM | POA: Diagnosis not present

## 2024-05-23 DIAGNOSIS — T82858A Stenosis of vascular prosthetic devices, implants and grafts, initial encounter: Secondary | ICD-10-CM | POA: Diagnosis not present

## 2024-05-23 DIAGNOSIS — N186 End stage renal disease: Secondary | ICD-10-CM | POA: Diagnosis not present

## 2024-05-24 ENCOUNTER — Other Ambulatory Visit (INDEPENDENT_AMBULATORY_CARE_PROVIDER_SITE_OTHER): Payer: Self-pay | Admitting: Nurse Practitioner

## 2024-05-24 DIAGNOSIS — N186 End stage renal disease: Secondary | ICD-10-CM | POA: Diagnosis not present

## 2024-05-24 DIAGNOSIS — Z992 Dependence on renal dialysis: Secondary | ICD-10-CM | POA: Diagnosis not present

## 2024-05-26 DIAGNOSIS — Z992 Dependence on renal dialysis: Secondary | ICD-10-CM | POA: Diagnosis not present

## 2024-05-26 DIAGNOSIS — N186 End stage renal disease: Secondary | ICD-10-CM | POA: Diagnosis not present

## 2024-05-27 NOTE — Progress Notes (Unsigned)
 MRN : [id number]  [name] is a 53 y.o. ([birth year]) male who presents with chief complaint of check access.  History of Present Illness:  Patient denies pain at his right forearm access.  He did undergo a thrombectomy with stenting of the venous outflow recently at an outside facility.  Presently the graft is working.  Duplex ultrasound obtained today demonstrates a flow volume of 820 cc/min with uniform velocities throughout  With respect to his lower extremities and his venous disease he remains healed and is wearing graduated compression wraps   No outpatient medications have been marked as taking for the 05/28/24 encounter (Appointment) with Jama, Cordella MATSU, MD.    Past Medical History:  Diagnosis Date   Anemia    Chronic kidney disease    14% FUNCTION   Diabetes mellitus without complication (HCC)    Diabetic retinopathy (HCC)    Dyspnea    DOE   Dysrhythmia    End stage renal disease (HCC)    History of orthopnea    error wrong patietn   Hypercholesteremia    error   Hyperkalemia    error wrong patient   Hypertension    Long-term insulin  use (HCC)    Lymphedema of leg    Metabolic encephalopathy    error   Microalbuminuria    MSSA (methicillin susceptible Staphylococcus aureus)    Obesity    PVD (peripheral vascular disease)    Respiratory failure (HCC)    Error   Sepsis (HCC)    error   Sleep apnea    no CPAP   UTI (urinary tract infection)    error   Venous ulcer (HCC)    Vitamin D deficiency     Past Surgical History:  Procedure Laterality Date   A/V FISTULAGRAM Right 10/24/2018   Procedure: A/V FISTULAGRAM;  Surgeon: Jama Cordella MATSU, MD;  Location: [facility];  Service: Cardiovascular;  Laterality: Right;   A/V FISTULAGRAM Right 11/24/2018   Procedure: A/V FISTULAGRAM;  Surgeon: Jama Cordella MATSU, MD;  Location: [facility];  Service: Cardiovascular;  Laterality: Right;   A/V FISTULAGRAM Left  07/31/2019   Procedure: A/V FISTULAGRAM;  Surgeon: Jama Cordella MATSU, MD;  Location: [facility];  Service: Cardiovascular;  Laterality: Left;   A/V FISTULAGRAM Left 12/11/2019   Procedure: A/V FISTULAGRAM;  Surgeon: Jama Cordella MATSU, MD;  Location: [facility];  Service: Cardiovascular;  Laterality: Left;   A/V FISTULAGRAM Left 07/08/2023   Procedure: A/V Fistulagram;  Surgeon: Jama Cordella MATSU, MD;  Location: [facility];  Service: Cardiovascular;  Laterality: Left;   A/V FISTULAGRAM Left 09/16/2023   Procedure: A/V Fistulagram;  Surgeon: Jama Cordella MATSU, MD;  Location: [facility];  Service: Cardiovascular;  Laterality: Left;   A/V FISTULAGRAM Left 12/15/2023   Procedure: A/V Fistulagram;  Surgeon: Marea Selinda RAMAN, MD;  Location: [facility];  Service: Cardiovascular;  Laterality: Left;   A/V FISTULAGRAM Right 04/17/2024   Procedure: A/V Fistulagram;  Surgeon: Jama Cordella MATSU, MD;  Location: [facility];  Service: Cardiovascular;  Laterality: Right;   A/V SHUNT INTERVENTION Right 02/21/2019   Procedure: A/V SHUNT INTERVENTION;  Surgeon: Jama Cordella MATSU, MD;  Location: [facility];  Service: Cardiovascular;  Laterality: Right;   APPLICATION OF WOUND VAC Left 10/03/2017   Procedure: APPLICATION OF  WOUND VAC;  Surgeon: Marea Selinda RAMAN, MD;  Location: [facility];  Service: General;  Laterality: Left;   APPLICATION OF WOUND VAC Left 10/11/2017   Procedure: APPLICATION OF WOUND VAC;  Surgeon: Jama Cordella MATSU, MD;  Location: [facility];  Service: Vascular;  Laterality: Left;   APPLICATION OF WOUND VAC Left 10/14/2017   Procedure: WOUND VAC CHANGE;  Surgeon: Jama Cordella MATSU, MD;  Location: [facility];  Service: Vascular;  Laterality: Left;  left lower leg   APPLICATION OF WOUND VAC Left 10/18/2017   Procedure: WOUND VAC CHANGE;  Surgeon: Jama Cordella MATSU, MD;  Location: [facility];  Service: Vascular;  Laterality: Left;   APPLICATION OF  WOUND VAC Left 10/07/2017   Procedure: APPLICATION OF WOUND VAC;  Surgeon: Jama Cordella MATSU, MD;  Location: [facility];  Service: Vascular;  Laterality: Left;   APPLICATION OF WOUND VAC Left 11/17/2022   Procedure: Wound vac exchange;  Surgeon: Jama Cordella MATSU, MD;  Location: [facility];  Service: Vascular;  Laterality: Left;   APPLICATION OF WOUND VAC Left 11/10/2022   Procedure: APPLICATION OF WOUND VAC;  Surgeon: Jama Cordella MATSU, MD;  Location: [facility];  Service: Vascular;  Laterality: Left;   APPLICATION OF WOUND VAC Left 11/24/2022   Procedure: WOUND VAC REMOVAL;  Surgeon: Jama Cordella MATSU, MD;  Location: [facility];  Service: Vascular;  Laterality: Left;   APPLICATION OF WOUND VAC Left 11/12/2022   Procedure: APPLICATION OF WOUND VAC;  Surgeon: Jama Cordella MATSU, MD;  Location: [facility];  Service: Vascular;  Laterality: Left;   APPLICATION OF WOUND VAC Left 01/06/2023   Procedure: APPLICATION OF WOUND VAC;  Surgeon: Jama Cordella MATSU, MD;  Location: [facility];  Service: Vascular;  Laterality: Left;   AV FISTULA INSERTION W/ RF MAGNETIC GUIDANCE Right 07/19/2018   Procedure: AV FISTULA INSERTION W/RF MAGNETIC GUIDANCE;  Surgeon: Jama Cordella MATSU, MD;  Location: [facility];  Service: Cardiovascular;  Laterality: Right;   AV FISTULA PLACEMENT Left 05/11/2019   Procedure: ARTERIOVENOUS (AV) FISTULA CREATION (RADIOCEPHALIC );  Surgeon: Jama Cordella MATSU, MD;  Location: [facility];  Service: Vascular;  Laterality: Left;   AV FISTULA PLACEMENT Left 02/20/2020   Procedure: INSERTION OF ARTERIOVENOUS (AV) GORE-TEX GRAFT LEFT ARM ( FOREARM LOOP );  Surgeon: Jama Cordella MATSU, MD;  Location: [facility];  Service: Vascular;  Laterality: Left;   CATARACT EXTRACTION W/PHACO Left 11/01/2018   Procedure: CATARACT EXTRACTION PHACO AND INTRAOCULAR LENS PLACEMENT (IOC)-LEFT, DIABETIC-INSULIN  DEPENDENT;  Surgeon: Myrna Adine Anes, MD;  Location: [facility];  Service: Ophthalmology;  Laterality: Left;  US   00:41.8 CDE 4.61 Fluid Pack Lot # [id number]   CATARACT EXTRACTION W/PHACO Right 04/27/2019   Procedure: CATARACT EXTRACTION PHACO AND INTRAOCULAR LENS PLACEMENT (IOC);  Surgeon: Myrna Adine Anes, MD;  Location: [facility];  Service: Ophthalmology;  Laterality: Right;  US  00:34 CDE 1.97 FLUID PACK LOT # [id number]    COLONOSCOPY WITH PROPOFOL  N/A 05/12/2020   Procedure: COLONOSCOPY WITH PROPOFOL ;  Surgeon: Toledo, Ladell POUR, MD;  Location: [facility];  Service: Gastroenterology;  Laterality: N/A;   DIALYSIS/PERMA CATHETER INSERTION N/A 02/23/2019   Procedure: DIALYSIS/PERMA CATHETER INSERTION;  Surgeon: Marea Selinda RAMAN, MD;  Location: [facility];  Service: Cardiovascular;  Laterality: N/A;   DIALYSIS/PERMA CATHETER INSERTION Right 12/15/2023   Procedure: DIALYSIS/PERMA CATHETER INSERTION;  Surgeon: Marea Selinda RAMAN, MD;  Location: [facility];  Service: Cardiovascular;  Laterality: Right;   DIALYSIS/PERMA CATHETER REMOVAL N/A 04/03/2020   Procedure: DIALYSIS/PERMA  CATHETER REMOVAL;  Surgeon: Jama Cordella MATSU, MD;  Location: [facility];  Service: Cardiovascular;  Laterality: N/A;   DIALYSIS/PERMA CATHETER REMOVAL N/A 02/03/2022   Procedure: DIALYSIS/PERMA CATHETER REMOVAL;  Surgeon: Jama Cordella MATSU, MD;  Location: [facility];  Service: Cardiovascular;  Laterality: N/A;   I & D EXTREMITY Left 10/11/2017   Procedure: IRRIGATION AND DEBRIDEMENT EXTREMITY;  Surgeon: Jama Cordella MATSU, MD;  Location: [facility];  Service: Vascular;  Laterality: Left;   INCISION AND DRAINAGE ABSCESS Right 10/07/2017   Procedure: INCISION AND DRAINAGE ABSCESS;  Surgeon: Jama Cordella MATSU, MD;  Location: [facility];  Service: Vascular;  Laterality: Right;   INCISION AND DRAINAGE OF WOUND Left 11/17/2022   Procedure: DEBRIDEMENT WOUND;  Surgeon: Jama Cordella MATSU, MD;  Location: [facility];  Service: Vascular;  Laterality: Left;   INCISION AND DRAINAGE OF WOUND Left 11/24/2022   Procedure:  DEBRIDEMENT WOUND LEFT LOWER EXTREMITY;  Surgeon: Jama Cordella MATSU, MD;  Location: [facility];  Service: Vascular;  Laterality: Left;   INSERTION OF ARTERIOVENOUS (AV) ARTEGRAFT ARM Right 03/14/2024   Procedure: INSERTION, GRAFT, ARTERIOVENOUS, UPPER EXTREMITY;  Surgeon: Jama Cordella MATSU, MD;  Location: [facility];  Service: Vascular;  Laterality: Right;   IRRIGATION AND DEBRIDEMENT ABSCESS Left 10/03/2017   Procedure: IRRIGATION AND DEBRIDEMENT ABSCESS with debridement of skin, soft tissue, muscle 50sq cm;  Surgeon: Marea Selinda RAMAN, MD;  Location: [facility];  Service: General;  Laterality: Left;   LOWER EXTREMITY ANGIOGRAPHY Left 10/15/2022   Procedure: Lower Extremity Angiography;  Surgeon: Jama Cordella MATSU, MD;  Location: [facility];  Service: Cardiovascular;  Laterality: Left;   TEE WITHOUT CARDIOVERSION N/A 03/29/2023   Procedure: TRANSESOPHAGEAL ECHOCARDIOGRAM;  Surgeon: Perla Evalene PARAS, MD;  Location: [facility];  Service: Cardiovascular;  Laterality: N/A;   TEMPORARY DIALYSIS CATHETER  02/20/2019   Procedure: TEMPORARY DIALYSIS CATHETER;  Surgeon: Jama Cordella MATSU, MD;  Location: [facility];  Service: Cardiovascular;;   TEMPORARY DIALYSIS CATHETER N/A 12/13/2023   Procedure: TEMPORARY DIALYSIS CATHETER;  Surgeon: Jama Cordella MATSU, MD;  Location: [facility];  Service: Cardiovascular;  Laterality: N/A;   UPPER EXTREMITY ANGIOGRAPHY Left 09/18/2019   Procedure: UPPER EXTREMITY ANGIOGRAPHY;  Surgeon: Jama Cordella MATSU, MD;  Location: [facility];  Service: Cardiovascular;  Laterality: Left;   WOUND DEBRIDEMENT Left 11/10/2022   Procedure: DEBRIDEMENT WOUND;  Surgeon: Jama Cordella MATSU, MD;  Location: [facility];  Service: Vascular;  Laterality: Left;   WOUND DEBRIDEMENT Left 11/12/2022   Procedure: DEBRIDEMENT WOUND;  Surgeon: Jama Cordella MATSU, MD;  Location: [facility];  Service: Vascular;  Laterality: Left;   WOUND DEBRIDEMENT Left 01/06/2023   Procedure:  DEBRIDEMENT WOUND;  Surgeon: Jama Cordella MATSU, MD;  Location: [facility];  Service: Vascular;  Laterality: Left;    Social History Social History   Tobacco Use   Smoking status: Former    Types: Cigars    Passive exposure: Past   Smokeless tobacco: Former    Quit date: 09/30/2017   Tobacco comments:    occasional  Vaping Use   Vaping status: Never Used  Substance Use Topics   Alcohol  use: Yes    Alcohol /week: 1.0 standard drink of alcohol     Types: 1 Cans of beer per week    Comment: beer   Drug use: Not Currently    Family History Family History  Problem Relation Age of Onset   Diabetes Father    Kidney disease Father  Diabetes Daughter     Allergies  Allergen Reactions   Lisinopril Swelling    Lips mouth    Furosemide  Cough    Light headed, blurry vision, weakness.   Latex Itching   Silicone Itching   Adhesive [Tape] Itching and Rash     REVIEW OF SYSTEMS (Negative unless checked)  Constitutional: [] Weight loss  [] Fever  [] Chills Cardiac: [] Chest pain   [] Chest pressure   [] Palpitations   [] Shortness of breath when laying flat   [] Shortness of breath with exertion. Vascular:  [] Pain in legs with walking   [] Pain in legs at rest  [] History of DVT   [] Phlebitis   [] Swelling in legs   [] Varicose veins   [] Non-healing ulcers Pulmonary:   [] Uses home oxygen   [] Productive cough   [] Hemoptysis   [] Wheeze  [] COPD   [] Asthma Neurologic:  [] Dizziness   [] Seizures   [] History of stroke   [] History of TIA  [] Aphasia   [] Vissual changes   [] Weakness or numbness in arm   [] Weakness or numbness in leg Musculoskeletal:   [] Joint swelling   [] Joint pain   [] Low back pain Hematologic:  [] Easy bruising  [] Easy bleeding   [] Hypercoagulable state   [] Anemic Gastrointestinal:  [] Diarrhea   [] Vomiting  [] Gastroesophageal reflux/heartburn   [] Difficulty swallowing. Genitourinary:  [x] Chronic kidney disease   [] Difficult urination  [] Frequent urination   [] Blood in urine Skin:   [] Rashes   [] Ulcers  Psychological:  [] History of anxiety   []  History of major depression.  Physical Examination  There were no vitals filed for this visit. There is no height or weight on file to calculate BMI. Gen: WD/WN, NAD Head: [place]/AT, No temporalis wasting.  Ear/Nose/Throat: Hearing grossly intact, nares w/o erythema or drainage Eyes: PER, EOMI, sclera nonicteric.  Neck: Supple, no gross masses or lesions.  No JVD.  Pulmonary:  Good air movement, no audible wheezing, no use of accessory muscles.  Cardiac: RRR, precordium non-hyperdynamic. Vascular:   Right forearm good thrill good bruit.  Right tunnel catheter clean dry and intact Vessel Right Left  Radial Palpable Palpable  Brachial Palpable Palpable  Gastrointestinal: soft, non-distended. No guarding/no peritoneal signs.  Musculoskeletal: M/S 5/5 throughout.  No deformity.  Neurologic: CN 2-12 intact. Pain and light touch intact in extremities.  Symmetrical.  Speech is fluent. Motor exam as listed above. Psychiatric: Judgment intact, Mood & affect appropriate for pt's clinical situation. Dermatologic: No rashes or ulcers noted.  No changes consistent with cellulitis.   CBC Lab Results  Component Value Date   WBC 6.1 02/15/2024   HGB 16.7 03/14/2024   HCT 49.0 03/14/2024   MCV 100.0 02/15/2024   PLT 168 02/15/2024    BMET    Component Value Date/Time   NA 139 03/14/2024 1125   K 5.2 (H) 03/14/2024 1125   CL 103 03/14/2024 1125   CO2 24 02/15/2024 0834   GLUCOSE 89 03/14/2024 1125   BUN 50 (H) 03/14/2024 1125   CREATININE 14.60 (H) 03/14/2024 1125   CALCIUM  9.1 02/15/2024 0834   GFRNONAA 4 (L) 02/15/2024 0834   GFRAA 9 (L) 02/19/2020 1316   CrCl cannot be calculated (Patient's most recent lab result is older than the maximum 21 days allowed.).  COAG Lab Results  Component Value Date   INR 1.2 12/12/2023   INR 1.0 03/24/2023   INR 1.1 11/12/2022    Radiology No results found.   Assessment/Plan 1.  End stage renal disease (HCC) (Primary) Recommend:  The patient is doing  well and currently has adequate dialysis access. The patient's dialysis center is not reporting any access issues. Flow pattern is stable when compared to the prior ultrasound.  The patient should have a duplex ultrasound of the dialysis access in 3 months given his recent thrombosis. The patient will follow-up with me in the office after each ultrasound   - VAS US  DUPLEX DIALYSIS ACCESS (AVF, AVG); Future  2. Chronic venous insufficiency No surgery or intervention at this point in time.   The patient is CEAP [id number]   I have discussed with the patient venous insufficiency and why it  causes symptoms. I have discussed with the patient the chronic skin changes that accompany venous insufficiency and the long term sequela such as infection and ulceration.  Patient will begin wearing graduated compression stockings or compression wraps on a daily basis.  The patient will put the compression on first thing in the morning and removing them in the evening. The patient is instructed specifically not to sleep in the compression.    In addition, behavioral modification including several periods of elevation of the lower extremities during the day will be continued. I have demonstrated that proper elevation is a position with the ankles at heart level.  The patient is instructed to begin routine exercise, especially walking on a daily basis  The patient will be assessed for a Lymph Pump depending on the effectiveness of conservative therapy and the control of the associated lymphedema.  3. Primary hypertension Continue antihypertensive medications as already ordered, these medications have been reviewed and there are no changes at this time.  4. Type 2 diabetes mellitus with diabetic peripheral angiopathy without gangrene, unspecified whether long term insulin  use (HCC) Continue hypoglycemic medications as already ordered,  these medications have been reviewed and there are no changes at this time.  Hgb A1C to be monitored as already arranged by primary service  5. Chronic obstructive pulmonary disease, unspecified COPD type (HCC) Continue pulmonary medications and aerosols as already ordered, these medications have been reviewed and there are no changes at this time.     Cordella Shawl, MD  05/27/2024 3:49 PM

## 2024-05-27 NOTE — H&P (View-Only) (Signed)
 MRN : [id number]  [name] is a 53 y.o. (1970-11-29) male who presents with chief complaint of check access.  History of Present Illness:  Patient denies pain at his right forearm access.  He did undergo a thrombectomy with stenting of the venous outflow recently at an outside facility.  Presently the graft is working.  Duplex ultrasound obtained today demonstrates a flow volume of 820 cc/min with uniform velocities throughout  With respect to his lower extremities and his venous disease he remains healed and is wearing graduated compression wraps   No outpatient medications have been marked as taking for the 05/28/24 encounter (Appointment) with Jama, Cordella MATSU, MD.    Past Medical History:  Diagnosis Date   Anemia    Chronic kidney disease    14% FUNCTION   Diabetes mellitus without complication (HCC)    Diabetic retinopathy (HCC)    Dyspnea    DOE   Dysrhythmia    End stage renal disease (HCC)    History of orthopnea    error wrong patietn   Hypercholesteremia    error   Hyperkalemia    error wrong patient   Hypertension    Long-term insulin  use (HCC)    Lymphedema of leg    Metabolic encephalopathy    error   Microalbuminuria    MSSA (methicillin susceptible Staphylococcus aureus)    Obesity    PVD (peripheral vascular disease)    Respiratory failure (HCC)    Error   Sepsis (HCC)    error   Sleep apnea    no CPAP   UTI (urinary tract infection)    error   Venous ulcer (HCC)    Vitamin D deficiency     Past Surgical History:  Procedure Laterality Date   A/V FISTULAGRAM Right 10/24/2018   Procedure: A/V FISTULAGRAM;  Surgeon: Jama Cordella MATSU, MD;  Location: [facility];  Service: Cardiovascular;  Laterality: Right;   A/V FISTULAGRAM Right 11/24/2018   Procedure: A/V FISTULAGRAM;  Surgeon: Jama Cordella MATSU, MD;  Location: [facility];  Service: Cardiovascular;  Laterality: Right;   A/V FISTULAGRAM Left  07/31/2019   Procedure: A/V FISTULAGRAM;  Surgeon: Jama Cordella MATSU, MD;  Location: [facility];  Service: Cardiovascular;  Laterality: Left;   A/V FISTULAGRAM Left 12/11/2019   Procedure: A/V FISTULAGRAM;  Surgeon: Jama Cordella MATSU, MD;  Location: [facility];  Service: Cardiovascular;  Laterality: Left;   A/V FISTULAGRAM Left 07/08/2023   Procedure: A/V Fistulagram;  Surgeon: Jama Cordella MATSU, MD;  Location: [facility];  Service: Cardiovascular;  Laterality: Left;   A/V FISTULAGRAM Left 09/16/2023   Procedure: A/V Fistulagram;  Surgeon: Jama Cordella MATSU, MD;  Location: [facility];  Service: Cardiovascular;  Laterality: Left;   A/V FISTULAGRAM Left 12/15/2023   Procedure: A/V Fistulagram;  Surgeon: Marea Selinda RAMAN, MD;  Location: [facility];  Service: Cardiovascular;  Laterality: Left;   A/V FISTULAGRAM Right 04/17/2024   Procedure: A/V Fistulagram;  Surgeon: Jama Cordella MATSU, MD;  Location: [facility];  Service: Cardiovascular;  Laterality: Right;   A/V SHUNT INTERVENTION Right 02/21/2019   Procedure: A/V SHUNT INTERVENTION;  Surgeon: Jama Cordella MATSU, MD;  Location: [facility];  Service: Cardiovascular;  Laterality: Right;   APPLICATION OF WOUND VAC Left 10/03/2017   Procedure: APPLICATION OF  WOUND VAC;  Surgeon: Marea Selinda RAMAN, MD;  Location: [facility];  Service: General;  Laterality: Left;   APPLICATION OF WOUND VAC Left 10/11/2017   Procedure: APPLICATION OF WOUND VAC;  Surgeon: Jama Cordella MATSU, MD;  Location: [facility];  Service: Vascular;  Laterality: Left;   APPLICATION OF WOUND VAC Left 10/14/2017   Procedure: WOUND VAC CHANGE;  Surgeon: Jama Cordella MATSU, MD;  Location: [facility];  Service: Vascular;  Laterality: Left;  left lower leg   APPLICATION OF WOUND VAC Left 10/18/2017   Procedure: WOUND VAC CHANGE;  Surgeon: Jama Cordella MATSU, MD;  Location: [facility];  Service: Vascular;  Laterality: Left;   APPLICATION OF  WOUND VAC Left 10/07/2017   Procedure: APPLICATION OF WOUND VAC;  Surgeon: Jama Cordella MATSU, MD;  Location: [facility];  Service: Vascular;  Laterality: Left;   APPLICATION OF WOUND VAC Left 11/17/2022   Procedure: Wound vac exchange;  Surgeon: Jama Cordella MATSU, MD;  Location: [facility];  Service: Vascular;  Laterality: Left;   APPLICATION OF WOUND VAC Left 11/10/2022   Procedure: APPLICATION OF WOUND VAC;  Surgeon: Jama Cordella MATSU, MD;  Location: [facility];  Service: Vascular;  Laterality: Left;   APPLICATION OF WOUND VAC Left 11/24/2022   Procedure: WOUND VAC REMOVAL;  Surgeon: Jama Cordella MATSU, MD;  Location: [facility];  Service: Vascular;  Laterality: Left;   APPLICATION OF WOUND VAC Left 11/12/2022   Procedure: APPLICATION OF WOUND VAC;  Surgeon: Jama Cordella MATSU, MD;  Location: [facility];  Service: Vascular;  Laterality: Left;   APPLICATION OF WOUND VAC Left 01/06/2023   Procedure: APPLICATION OF WOUND VAC;  Surgeon: Jama Cordella MATSU, MD;  Location: [facility];  Service: Vascular;  Laterality: Left;   AV FISTULA INSERTION W/ RF MAGNETIC GUIDANCE Right 07/19/2018   Procedure: AV FISTULA INSERTION W/RF MAGNETIC GUIDANCE;  Surgeon: Jama Cordella MATSU, MD;  Location: [facility];  Service: Cardiovascular;  Laterality: Right;   AV FISTULA PLACEMENT Left 05/11/2019   Procedure: ARTERIOVENOUS (AV) FISTULA CREATION (RADIOCEPHALIC );  Surgeon: Jama Cordella MATSU, MD;  Location: [facility];  Service: Vascular;  Laterality: Left;   AV FISTULA PLACEMENT Left 02/20/2020   Procedure: INSERTION OF ARTERIOVENOUS (AV) GORE-TEX GRAFT LEFT ARM ( FOREARM LOOP );  Surgeon: Jama Cordella MATSU, MD;  Location: [facility];  Service: Vascular;  Laterality: Left;   CATARACT EXTRACTION W/PHACO Left 11/01/2018   Procedure: CATARACT EXTRACTION PHACO AND INTRAOCULAR LENS PLACEMENT (IOC)-LEFT, DIABETIC-INSULIN  DEPENDENT;  Surgeon: Myrna Adine Anes, MD;  Location: [facility];  Service: Ophthalmology;  Laterality: Left;  US   00:41.8 CDE 4.61 Fluid Pack Lot # [id number]   CATARACT EXTRACTION W/PHACO Right 04/27/2019   Procedure: CATARACT EXTRACTION PHACO AND INTRAOCULAR LENS PLACEMENT (IOC);  Surgeon: Myrna Adine Anes, MD;  Location: [facility];  Service: Ophthalmology;  Laterality: Right;  US  00:34 CDE 1.97 FLUID PACK LOT # [id number]    COLONOSCOPY WITH PROPOFOL  N/A 05/12/2020   Procedure: COLONOSCOPY WITH PROPOFOL ;  Surgeon: Toledo, Ladell POUR, MD;  Location: [facility];  Service: Gastroenterology;  Laterality: N/A;   DIALYSIS/PERMA CATHETER INSERTION N/A 02/23/2019   Procedure: DIALYSIS/PERMA CATHETER INSERTION;  Surgeon: Marea Selinda RAMAN, MD;  Location: [facility];  Service: Cardiovascular;  Laterality: N/A;   DIALYSIS/PERMA CATHETER INSERTION Right 12/15/2023   Procedure: DIALYSIS/PERMA CATHETER INSERTION;  Surgeon: Marea Selinda RAMAN, MD;  Location: [facility];  Service: Cardiovascular;  Laterality: Right;   DIALYSIS/PERMA CATHETER REMOVAL N/A 04/03/2020   Procedure: DIALYSIS/PERMA  CATHETER REMOVAL;  Surgeon: Jama Cordella MATSU, MD;  Location: [facility];  Service: Cardiovascular;  Laterality: N/A;   DIALYSIS/PERMA CATHETER REMOVAL N/A 02/03/2022   Procedure: DIALYSIS/PERMA CATHETER REMOVAL;  Surgeon: Jama Cordella MATSU, MD;  Location: [facility];  Service: Cardiovascular;  Laterality: N/A;   I & D EXTREMITY Left 10/11/2017   Procedure: IRRIGATION AND DEBRIDEMENT EXTREMITY;  Surgeon: Jama Cordella MATSU, MD;  Location: [facility];  Service: Vascular;  Laterality: Left;   INCISION AND DRAINAGE ABSCESS Right 10/07/2017   Procedure: INCISION AND DRAINAGE ABSCESS;  Surgeon: Jama Cordella MATSU, MD;  Location: [facility];  Service: Vascular;  Laterality: Right;   INCISION AND DRAINAGE OF WOUND Left 11/17/2022   Procedure: DEBRIDEMENT WOUND;  Surgeon: Jama Cordella MATSU, MD;  Location: [facility];  Service: Vascular;  Laterality: Left;   INCISION AND DRAINAGE OF WOUND Left 11/24/2022   Procedure:  DEBRIDEMENT WOUND LEFT LOWER EXTREMITY;  Surgeon: Jama Cordella MATSU, MD;  Location: [facility];  Service: Vascular;  Laterality: Left;   INSERTION OF ARTERIOVENOUS (AV) ARTEGRAFT ARM Right 03/14/2024   Procedure: INSERTION, GRAFT, ARTERIOVENOUS, UPPER EXTREMITY;  Surgeon: Jama Cordella MATSU, MD;  Location: [facility];  Service: Vascular;  Laterality: Right;   IRRIGATION AND DEBRIDEMENT ABSCESS Left 10/03/2017   Procedure: IRRIGATION AND DEBRIDEMENT ABSCESS with debridement of skin, soft tissue, muscle 50sq cm;  Surgeon: Marea Selinda RAMAN, MD;  Location: [facility];  Service: General;  Laterality: Left;   LOWER EXTREMITY ANGIOGRAPHY Left 10/15/2022   Procedure: Lower Extremity Angiography;  Surgeon: Jama Cordella MATSU, MD;  Location: [facility];  Service: Cardiovascular;  Laterality: Left;   TEE WITHOUT CARDIOVERSION N/A 03/29/2023   Procedure: TRANSESOPHAGEAL ECHOCARDIOGRAM;  Surgeon: Perla Evalene PARAS, MD;  Location: [facility];  Service: Cardiovascular;  Laterality: N/A;   TEMPORARY DIALYSIS CATHETER  02/20/2019   Procedure: TEMPORARY DIALYSIS CATHETER;  Surgeon: Jama Cordella MATSU, MD;  Location: [facility];  Service: Cardiovascular;;   TEMPORARY DIALYSIS CATHETER N/A 12/13/2023   Procedure: TEMPORARY DIALYSIS CATHETER;  Surgeon: Jama Cordella MATSU, MD;  Location: [facility];  Service: Cardiovascular;  Laterality: N/A;   UPPER EXTREMITY ANGIOGRAPHY Left 09/18/2019   Procedure: UPPER EXTREMITY ANGIOGRAPHY;  Surgeon: Jama Cordella MATSU, MD;  Location: [facility];  Service: Cardiovascular;  Laterality: Left;   WOUND DEBRIDEMENT Left 11/10/2022   Procedure: DEBRIDEMENT WOUND;  Surgeon: Jama Cordella MATSU, MD;  Location: [facility];  Service: Vascular;  Laterality: Left;   WOUND DEBRIDEMENT Left 11/12/2022   Procedure: DEBRIDEMENT WOUND;  Surgeon: Jama Cordella MATSU, MD;  Location: [facility];  Service: Vascular;  Laterality: Left;   WOUND DEBRIDEMENT Left 01/06/2023   Procedure:  DEBRIDEMENT WOUND;  Surgeon: Jama Cordella MATSU, MD;  Location: [facility];  Service: Vascular;  Laterality: Left;    Social History Social History   Tobacco Use   Smoking status: Former    Types: Cigars    Passive exposure: Past   Smokeless tobacco: Former    Quit date: 09/30/2017   Tobacco comments:    occasional  Vaping Use   Vaping status: Never Used  Substance Use Topics   Alcohol  use: Yes    Alcohol /week: 1.0 standard drink of alcohol     Types: 1 Cans of beer per week    Comment: beer   Drug use: Not Currently    Family History Family History  Problem Relation Age of Onset   Diabetes Father    Kidney disease Father  Diabetes Daughter     Allergies  Allergen Reactions   Lisinopril Swelling    Lips mouth    Furosemide  Cough    Light headed, blurry vision, weakness.   Latex Itching   Silicone Itching   Adhesive [Tape] Itching and Rash     REVIEW OF SYSTEMS (Negative unless checked)  Constitutional: [] Weight loss  [] Fever  [] Chills Cardiac: [] Chest pain   [] Chest pressure   [] Palpitations   [] Shortness of breath when laying flat   [] Shortness of breath with exertion. Vascular:  [] Pain in legs with walking   [] Pain in legs at rest  [] History of DVT   [] Phlebitis   [] Swelling in legs   [] Varicose veins   [] Non-healing ulcers Pulmonary:   [] Uses home oxygen   [] Productive cough   [] Hemoptysis   [] Wheeze  [] COPD   [] Asthma Neurologic:  [] Dizziness   [] Seizures   [] History of stroke   [] History of TIA  [] Aphasia   [] Vissual changes   [] Weakness or numbness in arm   [] Weakness or numbness in leg Musculoskeletal:   [] Joint swelling   [] Joint pain   [] Low back pain Hematologic:  [] Easy bruising  [] Easy bleeding   [] Hypercoagulable state   [] Anemic Gastrointestinal:  [] Diarrhea   [] Vomiting  [] Gastroesophageal reflux/heartburn   [] Difficulty swallowing. Genitourinary:  [x] Chronic kidney disease   [] Difficult urination  [] Frequent urination   [] Blood in urine Skin:   [] Rashes   [] Ulcers  Psychological:  [] History of anxiety   []  History of major depression.  Physical Examination  There were no vitals filed for this visit. There is no height or weight on file to calculate BMI. Gen: WD/WN, NAD Head: [place]/AT, No temporalis wasting.  Ear/Nose/Throat: Hearing grossly intact, nares w/o erythema or drainage Eyes: PER, EOMI, sclera nonicteric.  Neck: Supple, no gross masses or lesions.  No JVD.  Pulmonary:  Good air movement, no audible wheezing, no use of accessory muscles.  Cardiac: RRR, precordium non-hyperdynamic. Vascular:   Right forearm good thrill good bruit.  Right tunnel catheter clean dry and intact Vessel Right Left  Radial Palpable Palpable  Brachial Palpable Palpable  Gastrointestinal: soft, non-distended. No guarding/no peritoneal signs.  Musculoskeletal: M/S 5/5 throughout.  No deformity.  Neurologic: CN 2-12 intact. Pain and light touch intact in extremities.  Symmetrical.  Speech is fluent. Motor exam as listed above. Psychiatric: Judgment intact, Mood & affect appropriate for pt's clinical situation. Dermatologic: No rashes or ulcers noted.  No changes consistent with cellulitis.   CBC Lab Results  Component Value Date   WBC 6.1 02/15/2024   HGB 16.7 03/14/2024   HCT 49.0 03/14/2024   MCV 100.0 02/15/2024   PLT 168 02/15/2024    BMET    Component Value Date/Time   NA 139 03/14/2024 1125   K 5.2 (H) 03/14/2024 1125   CL 103 03/14/2024 1125   CO2 24 02/15/2024 0834   GLUCOSE 89 03/14/2024 1125   BUN 50 (H) 03/14/2024 1125   CREATININE 14.60 (H) 03/14/2024 1125   CALCIUM  9.1 02/15/2024 0834   GFRNONAA 4 (L) 02/15/2024 0834   GFRAA 9 (L) 02/19/2020 1316   CrCl cannot be calculated (Patient's most recent lab result is older than the maximum 21 days allowed.).  COAG Lab Results  Component Value Date   INR 1.2 12/12/2023   INR 1.0 03/24/2023   INR 1.1 11/12/2022    Radiology No results found.   Assessment/Plan 1.  End stage renal disease (HCC) (Primary) Recommend:  The patient is doing  well and currently has adequate dialysis access. The patient's dialysis center is not reporting any access issues. Flow pattern is stable when compared to the prior ultrasound.  The patient should have a duplex ultrasound of the dialysis access in 3 months given his recent thrombosis. The patient will follow-up with me in the office after each ultrasound   - VAS US  DUPLEX DIALYSIS ACCESS (AVF, AVG); Future  2. Chronic venous insufficiency No surgery or intervention at this point in time.   The patient is CEAP [id number]   I have discussed with the patient venous insufficiency and why it  causes symptoms. I have discussed with the patient the chronic skin changes that accompany venous insufficiency and the long term sequela such as infection and ulceration.  Patient will begin wearing graduated compression stockings or compression wraps on a daily basis.  The patient will put the compression on first thing in the morning and removing them in the evening. The patient is instructed specifically not to sleep in the compression.    In addition, behavioral modification including several periods of elevation of the lower extremities during the day will be continued. I have demonstrated that proper elevation is a position with the ankles at heart level.  The patient is instructed to begin routine exercise, especially walking on a daily basis  The patient will be assessed for a Lymph Pump depending on the effectiveness of conservative therapy and the control of the associated lymphedema.  3. Primary hypertension Continue antihypertensive medications as already ordered, these medications have been reviewed and there are no changes at this time.  4. Type 2 diabetes mellitus with diabetic peripheral angiopathy without gangrene, unspecified whether long term insulin  use (HCC) Continue hypoglycemic medications as already ordered,  these medications have been reviewed and there are no changes at this time.  Hgb A1C to be monitored as already arranged by primary service  5. Chronic obstructive pulmonary disease, unspecified COPD type (HCC) Continue pulmonary medications and aerosols as already ordered, these medications have been reviewed and there are no changes at this time.     Cordella Shawl, MD  05/27/2024 3:49 PM

## 2024-05-28 ENCOUNTER — Ambulatory Visit (INDEPENDENT_AMBULATORY_CARE_PROVIDER_SITE_OTHER): Admitting: Vascular Surgery

## 2024-05-28 ENCOUNTER — Ambulatory Visit (INDEPENDENT_AMBULATORY_CARE_PROVIDER_SITE_OTHER)

## 2024-05-28 VITALS — BP 133/88 | HR 65 | Ht 76.0 in | Wt 389.0 lb

## 2024-05-28 DIAGNOSIS — I10 Essential (primary) hypertension: Secondary | ICD-10-CM

## 2024-05-28 DIAGNOSIS — N186 End stage renal disease: Secondary | ICD-10-CM | POA: Diagnosis not present

## 2024-05-28 DIAGNOSIS — I872 Venous insufficiency (chronic) (peripheral): Secondary | ICD-10-CM

## 2024-05-28 DIAGNOSIS — J449 Chronic obstructive pulmonary disease, unspecified: Secondary | ICD-10-CM

## 2024-05-28 DIAGNOSIS — E1151 Type 2 diabetes mellitus with diabetic peripheral angiopathy without gangrene: Secondary | ICD-10-CM

## 2024-05-29 ENCOUNTER — Encounter (INDEPENDENT_AMBULATORY_CARE_PROVIDER_SITE_OTHER): Payer: Self-pay | Admitting: Vascular Surgery

## 2024-05-29 DIAGNOSIS — N186 End stage renal disease: Secondary | ICD-10-CM | POA: Diagnosis not present

## 2024-05-29 DIAGNOSIS — Z992 Dependence on renal dialysis: Secondary | ICD-10-CM | POA: Diagnosis not present

## 2024-05-31 DIAGNOSIS — N186 End stage renal disease: Secondary | ICD-10-CM | POA: Diagnosis not present

## 2024-05-31 DIAGNOSIS — Z992 Dependence on renal dialysis: Secondary | ICD-10-CM | POA: Diagnosis not present

## 2024-06-02 DIAGNOSIS — N186 End stage renal disease: Secondary | ICD-10-CM | POA: Diagnosis not present

## 2024-06-07 DIAGNOSIS — Z992 Dependence on renal dialysis: Secondary | ICD-10-CM | POA: Diagnosis not present

## 2024-06-08 ENCOUNTER — Other Ambulatory Visit (INDEPENDENT_AMBULATORY_CARE_PROVIDER_SITE_OTHER): Payer: Self-pay | Admitting: Vascular Surgery

## 2024-06-12 DIAGNOSIS — N186 End stage renal disease: Secondary | ICD-10-CM | POA: Diagnosis not present

## 2024-06-16 DIAGNOSIS — N186 End stage renal disease: Secondary | ICD-10-CM | POA: Diagnosis not present

## 2024-06-18 ENCOUNTER — Encounter (INDEPENDENT_AMBULATORY_CARE_PROVIDER_SITE_OTHER)

## 2024-06-18 ENCOUNTER — Ambulatory Visit (INDEPENDENT_AMBULATORY_CARE_PROVIDER_SITE_OTHER): Admitting: Nurse Practitioner

## 2024-06-21 ENCOUNTER — Telehealth (INDEPENDENT_AMBULATORY_CARE_PROVIDER_SITE_OTHER): Payer: Self-pay

## 2024-06-21 DIAGNOSIS — N186 End stage renal disease: Secondary | ICD-10-CM | POA: Diagnosis not present

## 2024-06-21 NOTE — Telephone Encounter (Signed)
I attempted to contact the patient to schedule him for a permcath removal. A message was left for a return call.

## 2024-06-22 NOTE — Progress Notes (Deleted)
 06/22/24 9:10 AM   [name] 10-18-1970 [id number]  Referring provider:  Jeffie Cheryl BRAVO, MD [address] [place],  [place] [address]   Urological history:  1. ED  - contributing factors of age, testosterone  deficiency, DM, HTN, sleep apnea (untreated), history of smoking and ESRD  - did not tolerate the ICI - sildenafil  50 mg, on-demand-dosing  No chief complaint on file.   HPI: [name] is a 53 y.o.man with diabetes and end stage renal disease on dialysis who presents today for follow up with his SO, Debbie.    Previous records reviewed.   He has dialysis on Tuesday, Thursdays and Saturdays.  SHIM ***  He does not have confidence that he could get and keep an erection, his erections are not firm enough for penetrative intercourse, he has difficulty maintaining his erections,  and he is not finding intercourse satisfactory for him.  ***  Patient still having spontaneous erections.  ***   He denies any pain or curvature with erections.    He is not able to ejaculate, has pain with ejaculation, and has blood in his ejaculate fluid.   ***  Testosterone  level ***  Cholesterol ***  Hemoglobin A1c (02/2024) 7.1  TSH ***   Tried and failed ***  PMH: Past Medical History:  Diagnosis Date   Anemia    Chronic kidney disease    14% FUNCTION   Diabetes mellitus without complication (HCC)    Diabetic retinopathy (HCC)    Dyspnea    DOE   Dysrhythmia    End stage renal disease (HCC)    History of orthopnea    error wrong patietn   Hypercholesteremia    error   Hyperkalemia    error wrong patient   Hypertension    Long-term insulin  use (HCC)    Lymphedema of leg    Metabolic encephalopathy    error   Microalbuminuria    MSSA (methicillin susceptible Staphylococcus aureus)    Obesity    PVD (peripheral vascular disease)    Respiratory failure (HCC)    Error   Sepsis (HCC)    error   Sleep apnea    no CPAP   UTI (urinary tract infection)     error   Venous ulcer (HCC)    Vitamin D deficiency     Surgical History: Past Surgical History:  Procedure Laterality Date   A/V FISTULAGRAM Right 10/24/2018   Procedure: A/V FISTULAGRAM;  Surgeon: Jama Cordella MATSU, MD;  Location: [facility];  Service: Cardiovascular;  Laterality: Right;   A/V FISTULAGRAM Right 11/24/2018   Procedure: A/V FISTULAGRAM;  Surgeon: Jama Cordella MATSU, MD;  Location: [facility];  Service: Cardiovascular;  Laterality: Right;   A/V FISTULAGRAM Left 07/31/2019   Procedure: A/V FISTULAGRAM;  Surgeon: Jama Cordella MATSU, MD;  Location: [facility];  Service: Cardiovascular;  Laterality: Left;   A/V FISTULAGRAM Left 12/11/2019   Procedure: A/V FISTULAGRAM;  Surgeon: Jama Cordella MATSU, MD;  Location: [facility];  Service: Cardiovascular;  Laterality: Left;   A/V FISTULAGRAM Left 07/08/2023   Procedure: A/V Fistulagram;  Surgeon: Jama Cordella MATSU, MD;  Location: [facility];  Service: Cardiovascular;  Laterality: Left;   A/V FISTULAGRAM Left 09/16/2023   Procedure: A/V Fistulagram;  Surgeon: Jama Cordella MATSU, MD;  Location: [facility];  Service: Cardiovascular;  Laterality: Left;   A/V FISTULAGRAM Left 12/15/2023   Procedure: A/V Fistulagram;  Surgeon: Marea,  Selinda RAMAN, MD;  Location: [facility];  Service: Cardiovascular;  Laterality: Left;   A/V FISTULAGRAM Right 04/17/2024   Procedure: A/V Fistulagram;  Surgeon: Jama Cordella MATSU, MD;  Location: [facility];  Service: Cardiovascular;  Laterality: Right;   A/V SHUNT INTERVENTION Right 02/21/2019   Procedure: A/V SHUNT INTERVENTION;  Surgeon: Jama Cordella MATSU, MD;  Location: [facility];  Service: Cardiovascular;  Laterality: Right;   APPLICATION OF WOUND VAC Left 10/03/2017   Procedure: APPLICATION OF WOUND VAC;  Surgeon: Marea Selinda RAMAN, MD;  Location: [facility];  Service: General;  Laterality: Left;   APPLICATION OF WOUND VAC Left  10/11/2017   Procedure: APPLICATION OF WOUND VAC;  Surgeon: Jama Cordella MATSU, MD;  Location: [facility];  Service: Vascular;  Laterality: Left;   APPLICATION OF WOUND VAC Left 10/14/2017   Procedure: WOUND VAC CHANGE;  Surgeon: Jama Cordella MATSU, MD;  Location: [facility];  Service: Vascular;  Laterality: Left;  left lower leg   APPLICATION OF WOUND VAC Left 10/18/2017   Procedure: WOUND VAC CHANGE;  Surgeon: Jama Cordella MATSU, MD;  Location: [facility];  Service: Vascular;  Laterality: Left;   APPLICATION OF WOUND VAC Left 10/07/2017   Procedure: APPLICATION OF WOUND VAC;  Surgeon: Jama Cordella MATSU, MD;  Location: [facility];  Service: Vascular;  Laterality: Left;   APPLICATION OF WOUND VAC Left 11/17/2022   Procedure: Wound vac exchange;  Surgeon: Jama Cordella MATSU, MD;  Location: [facility];  Service: Vascular;  Laterality: Left;   APPLICATION OF WOUND VAC Left 11/10/2022   Procedure: APPLICATION OF WOUND VAC;  Surgeon: Jama Cordella MATSU, MD;  Location: [facility];  Service: Vascular;  Laterality: Left;   APPLICATION OF WOUND VAC Left 11/24/2022   Procedure: WOUND VAC REMOVAL;  Surgeon: Jama Cordella MATSU, MD;  Location: [facility];  Service: Vascular;  Laterality: Left;   APPLICATION OF WOUND VAC Left 11/12/2022   Procedure: APPLICATION OF WOUND VAC;  Surgeon: Jama Cordella MATSU, MD;  Location: [facility];  Service: Vascular;  Laterality: Left;   APPLICATION OF WOUND VAC Left 01/06/2023   Procedure: APPLICATION OF WOUND VAC;  Surgeon: Jama Cordella MATSU, MD;  Location: [facility];  Service: Vascular;  Laterality: Left;   AV FISTULA INSERTION W/ RF MAGNETIC GUIDANCE Right 07/19/2018   Procedure: AV FISTULA INSERTION W/RF MAGNETIC GUIDANCE;  Surgeon: Jama Cordella MATSU, MD;  Location: [facility];  Service: Cardiovascular;  Laterality: Right;   AV FISTULA PLACEMENT Left 05/11/2019   Procedure: ARTERIOVENOUS (AV) FISTULA CREATION (RADIOCEPHALIC );  Surgeon: Jama Cordella MATSU, MD;  Location: [facility];   Service: Vascular;  Laterality: Left;   AV FISTULA PLACEMENT Left 02/20/2020   Procedure: INSERTION OF ARTERIOVENOUS (AV) GORE-TEX GRAFT LEFT ARM ( FOREARM LOOP );  Surgeon: Jama Cordella MATSU, MD;  Location: [facility];  Service: Vascular;  Laterality: Left;   CATARACT EXTRACTION W/PHACO Left 11/01/2018   Procedure: CATARACT EXTRACTION PHACO AND INTRAOCULAR LENS PLACEMENT (IOC)-LEFT, DIABETIC-INSULIN  DEPENDENT;  Surgeon: Myrna Adine Anes, MD;  Location: [facility];  Service: Ophthalmology;  Laterality: Left;  US  00:41.8 CDE 4.61 Fluid Pack Lot # [id number]   CATARACT EXTRACTION W/PHACO Right 04/27/2019   Procedure: CATARACT EXTRACTION PHACO AND INTRAOCULAR LENS PLACEMENT (IOC);  Surgeon: Myrna Adine Anes, MD;  Location: [facility];  Service: Ophthalmology;  Laterality: Right;  US  00:34 CDE 1.97 FLUID PACK LOT # [id number]    COLONOSCOPY WITH PROPOFOL  N/A 05/12/2020   Procedure: COLONOSCOPY WITH PROPOFOL ;  Surgeon: Toledo, Ladell POUR, MD;  Location: [facility];  Service: Gastroenterology;  Laterality: N/A;   DIALYSIS/PERMA CATHETER INSERTION N/A 02/23/2019   Procedure: DIALYSIS/PERMA CATHETER INSERTION;  Surgeon: Marea Selinda RAMAN, MD;  Location: [facility];  Service: Cardiovascular;  Laterality: N/A;   DIALYSIS/PERMA CATHETER INSERTION Right 12/15/2023   Procedure: DIALYSIS/PERMA CATHETER INSERTION;  Surgeon: Marea Selinda RAMAN, MD;  Location: [facility];  Service: Cardiovascular;  Laterality: Right;   DIALYSIS/PERMA CATHETER REMOVAL N/A 04/03/2020   Procedure: DIALYSIS/PERMA CATHETER REMOVAL;  Surgeon: Jama Cordella MATSU, MD;  Location: [facility];  Service: Cardiovascular;  Laterality: N/A;   DIALYSIS/PERMA CATHETER REMOVAL N/A 02/03/2022   Procedure: DIALYSIS/PERMA CATHETER REMOVAL;  Surgeon: Jama Cordella MATSU, MD;  Location: [facility];  Service: Cardiovascular;  Laterality: N/A;   I & D EXTREMITY Left 10/11/2017   Procedure: IRRIGATION AND DEBRIDEMENT EXTREMITY;  Surgeon:  Jama Cordella MATSU, MD;  Location: [facility];  Service: Vascular;  Laterality: Left;   INCISION AND DRAINAGE ABSCESS Right 10/07/2017   Procedure: INCISION AND DRAINAGE ABSCESS;  Surgeon: Jama Cordella MATSU, MD;  Location: [facility];  Service: Vascular;  Laterality: Right;   INCISION AND DRAINAGE OF WOUND Left 11/17/2022   Procedure: DEBRIDEMENT WOUND;  Surgeon: Jama Cordella MATSU, MD;  Location: [facility];  Service: Vascular;  Laterality: Left;   INCISION AND DRAINAGE OF WOUND Left 11/24/2022   Procedure: DEBRIDEMENT WOUND LEFT LOWER EXTREMITY;  Surgeon: Jama Cordella MATSU, MD;  Location: [facility];  Service: Vascular;  Laterality: Left;   INSERTION OF ARTERIOVENOUS (AV) ARTEGRAFT ARM Right 03/14/2024   Procedure: INSERTION, GRAFT, ARTERIOVENOUS, UPPER EXTREMITY;  Surgeon: Jama Cordella MATSU, MD;  Location: [facility];  Service: Vascular;  Laterality: Right;   IRRIGATION AND DEBRIDEMENT ABSCESS Left 10/03/2017   Procedure: IRRIGATION AND DEBRIDEMENT ABSCESS with debridement of skin, soft tissue, muscle 50sq cm;  Surgeon: Marea Selinda RAMAN, MD;  Location: [facility];  Service: General;  Laterality: Left;   LOWER EXTREMITY ANGIOGRAPHY Left 10/15/2022   Procedure: Lower Extremity Angiography;  Surgeon: Jama Cordella MATSU, MD;  Location: [facility];  Service: Cardiovascular;  Laterality: Left;   TEE WITHOUT CARDIOVERSION N/A 03/29/2023   Procedure: TRANSESOPHAGEAL ECHOCARDIOGRAM;  Surgeon: Perla Evalene PARAS, MD;  Location: [facility];  Service: Cardiovascular;  Laterality: N/A;   TEMPORARY DIALYSIS CATHETER  02/20/2019   Procedure: TEMPORARY DIALYSIS CATHETER;  Surgeon: Jama Cordella MATSU, MD;  Location: [facility];  Service: Cardiovascular;;   TEMPORARY DIALYSIS CATHETER N/A 12/13/2023   Procedure: TEMPORARY DIALYSIS CATHETER;  Surgeon: Jama Cordella MATSU, MD;  Location: [facility];  Service: Cardiovascular;  Laterality: N/A;   UPPER EXTREMITY ANGIOGRAPHY Left 09/18/2019   Procedure: UPPER  EXTREMITY ANGIOGRAPHY;  Surgeon: Jama Cordella MATSU, MD;  Location: [facility];  Service: Cardiovascular;  Laterality: Left;   WOUND DEBRIDEMENT Left 11/10/2022   Procedure: DEBRIDEMENT WOUND;  Surgeon: Jama Cordella MATSU, MD;  Location: [facility];  Service: Vascular;  Laterality: Left;   WOUND DEBRIDEMENT Left 11/12/2022   Procedure: DEBRIDEMENT WOUND;  Surgeon: Jama Cordella MATSU, MD;  Location: [facility];  Service: Vascular;  Laterality: Left;   WOUND DEBRIDEMENT Left 01/06/2023   Procedure: DEBRIDEMENT WOUND;  Surgeon: Jama Cordella MATSU, MD;  Location: [facility];  Service: Vascular;  Laterality: Left;    Home Medications:  Allergies as of 06/25/2024       Reactions   Lisinopril Swelling   Lips mouth    Furosemide  Cough  Light headed, blurry vision, weakness.   Latex Itching   Silicone Itching   Adhesive [tape] Itching, Rash        Medication List        Accurate as of June 22, 2024  9:10 AM. If you have any questions, ask your nurse or doctor.          Accu-Chek Guide test strip Generic drug: glucose blood USE 1 STRIP TOTAL 2 TIMES DAILY FOR 90 DAYS   acetaminophen  325 MG tablet Commonly known as: TYLENOL  Take 2 tablets (650 mg total) by mouth every 6 (six) hours as needed for mild pain (or Fever >/= 101).   allopurinol  100 MG tablet Commonly known as: ZYLOPRIM  Take 100 mg by mouth daily.   amLODipine  10 MG tablet Commonly known as: NORVASC  Take 5 mg by mouth daily as needed (if bp is 190/90 or higher).   atorvastatin  80 MG tablet Commonly known as: LIPITOR Take 80 mg by mouth at bedtime.   calcium  acetate 667 MG capsule Commonly known as: PHOSLO  Take 1,334-2,001 mg by mouth See admin instructions. Take 2001 mg by mouth with each meal & take 1334 mg by mouth with each snack.   cinacalcet  30 MG tablet Commonly known as: SENSIPAR  Take 60 mg by mouth daily.   CINNAMON  PO Take 500 mg by mouth 2 (two) times daily.   clopidogrel  75 MG  tablet Commonly known as: PLAVIX  Take 1 tablet (75 mg total) by mouth daily.   Dexcom G7 Sensor Misc USE 1 EACH EVERY 10 (TEN) DAYS   Eliquis  5 MG Tabs tablet Generic drug: apixaban  TAKE 1 TABLET BY MOUTH 2 TIMES DAILY. PLEASE RESTART YOUR ELIQUIS  WITH TOMORROW MORNING'S DOSE   EYE HEALTH PO Take 1 capsule by mouth daily.   HYDROcodone -acetaminophen  5-325 MG tablet Commonly known as: NORCO/VICODIN Take 1-2 tablets by mouth every 6 (six) hours as needed for moderate pain (pain score 4-6) or severe pain (pain score 7-10).   Insulin  Aspart FlexPen 100 UNIT/ML Commonly known as: NOVOLOG  Inject 5 Units subcutaneously 3 (three) times daily with meals SKIP dose if your sugar before your meal is your sugar is below 100.   insulin  degludec 100 UNIT/ML FlexTouch Pen Commonly known as: TRESIBA Inject 30 Units subcutaneously once daily   lidocaine -prilocaine  cream Commonly known as: EMLA  Apply 1 application topically as needed (port access).   Ozempic (0.25 or 0.5 MG/DOSE) 2 MG/3ML Sopn Generic drug: Semaglutide(0.25 or 0.5MG /DOS) 0.25 mg once a week.   Rena-Vite Rx 1 MG Tabs Take 1 tablet by mouth 3 (three) times a week.   sildenafil  20 MG tablet Commonly known as: REVATIO  TAKE 1 TABLET DAILY. DO NOT TAKE WITH NITRATES.   SYSTANE OP Place 1 drop into both eyes daily as needed (dry eyes).        Allergies:  Allergies  Allergen Reactions   Lisinopril Swelling    Lips mouth    Furosemide  Cough    Light headed, blurry vision, weakness.   Latex Itching   Silicone Itching   Adhesive [Tape] Itching and Rash    Family History: Family History  Problem Relation Age of Onset   Diabetes Father    Kidney disease Father    Diabetes Daughter     Social History:  reports that he has quit smoking. His smoking use included cigars. He has been exposed to tobacco smoke. He quit smokeless tobacco use about 6 years ago. He reports current alcohol  use of about 1.0 standard drink of  alcohol  per week. He reports that he does not currently use drugs.   Physical Exam: There were no vitals taken for this visit.  Constitutional:  Well nourished. Alert and oriented, No acute distress. HEENT: [place] AT, moist mucus membranes.  Trachea midline Cardiovascular: No clubbing, cyanosis, or edema. Respiratory: Normal respiratory effort, no increased work of breathing. Neurologic: Grossly intact, no focal deficits, moving all 4 extremities. Psychiatric: Normal mood and affect.   Laboratory Data: BMET    Component Value Date/Time   NA 139 03/14/2024 1125   K 5.2 (H) 03/14/2024 1125   CL 103 03/14/2024 1125   CO2 24 02/15/2024 0834   GLUCOSE 89 03/14/2024 1125   BUN 50 (H) 03/14/2024 1125   CREATININE 14.60 (H) 03/14/2024 1125   CALCIUM  9.1 02/15/2024 0834   GFRNONAA 4 (L) 02/15/2024 0834    CBC    Component Value Date/Time   WBC 6.1 02/15/2024 0834   RBC 4.11 (L) 02/15/2024 0834   HGB 16.7 03/14/2024 1125   HCT 49.0 03/14/2024 1125   PLT 168 02/15/2024 0834   MCV 100.0 02/15/2024 0834   MCH 33.1 02/15/2024 0834   MCHC 33.1 02/15/2024 0834   RDW 14.6 02/15/2024 0834   LYMPHSABS 1.2 12/12/2023 1518   MONOABS 0.5 12/12/2023 1518   EOSABS 0.1 12/12/2023 1518   BASOSABS 0.0 12/12/2023 1518   Hemoglobin A1C Order: [id number] Component Ref Range & Units 4 mo ago  Hemoglobin A1C 4.2 - 5.6 % 6.8 High   Average Blood Glucose (Calc) mg/dL 851  Resulting Agency [facility]  Narrative Performed by [facility] Normal Range:    4.2 - 5.6% Increased Risk:  5.7 - 6.4% Diabetes:        >= 6.5% Glycemic Control for adults with diabetes:  <7%    Specimen Collected: 02/21/23 10:15   Performed by: [facility] Last Resulted: 02/21/23 10:25  Received From: [facility]  Result Received: 02/21/23 14:21   I have reviewed the labs.   Pertinent Imaging: N/A   Assessment & Plan:    ED  -Responding to the 20 mg  sildenafil  -he is at goal  2. Testosterone  deficiency - followed by endocrinology   3. Increased prolactin - followed by endocrinology  No follow-ups on file.  CLOTILDA HELON RIGGERS   [facility] [address], [address] [place], [place] [address] [phone]

## 2024-06-23 DIAGNOSIS — N186 End stage renal disease: Secondary | ICD-10-CM | POA: Diagnosis not present

## 2024-06-23 DIAGNOSIS — Z992 Dependence on renal dialysis: Secondary | ICD-10-CM | POA: Diagnosis not present

## 2024-06-25 ENCOUNTER — Encounter
Admission: RE | Disposition: A | Discharge status: Home / Self Care | Payer: Self-pay | Source: Ambulatory Visit | Attending: Vascular Surgery

## 2024-06-25 ENCOUNTER — Ambulatory Visit: Payer: Self-pay | Admitting: Urology

## 2024-06-25 ENCOUNTER — Ambulatory Visit
Admission: RE | Admit: 2024-06-25 | Discharge: 2024-06-25 | Disposition: A | Discharge status: Home / Self Care | Source: Ambulatory Visit | Attending: Vascular Surgery | Admitting: Vascular Surgery

## 2024-06-25 ENCOUNTER — Telehealth (INDEPENDENT_AMBULATORY_CARE_PROVIDER_SITE_OTHER): Payer: Self-pay

## 2024-06-25 DIAGNOSIS — Z452 Encounter for adjustment and management of vascular access device: Secondary | ICD-10-CM

## 2024-06-25 DIAGNOSIS — Z4901 Encounter for fitting and adjustment of extracorporeal dialysis catheter: Secondary | ICD-10-CM | POA: Diagnosis not present

## 2024-06-25 DIAGNOSIS — Z992 Dependence on renal dialysis: Secondary | ICD-10-CM | POA: Diagnosis not present

## 2024-06-25 DIAGNOSIS — N186 End stage renal disease: Secondary | ICD-10-CM | POA: Insufficient documentation

## 2024-06-25 HISTORY — PX: DIALYSIS/PERMA CATHETER REMOVAL: CATH118289

## 2024-06-25 SURGERY — DIALYSIS/PERMA CATHETER REMOVAL
Anesthesia: LOCAL

## 2024-06-25 MED ORDER — LIDOCAINE-EPINEPHRINE (PF) 1 %-1:200000 IJ SOLN
INTRAMUSCULAR | Status: DC | PRN
  Administered 2024-06-25: 20 mL

## 2024-06-25 SURGICAL SUPPLY — 1 items: TRAY LACERAT/PLASTIC (MISCELLANEOUS) IMPLANT

## 2024-06-25 NOTE — Interval H&P Note (Signed)
 History and Physical Interval Note:  06/25/2024 3:16 PM  [name]  has presented today for surgery, with the diagnosis of Perma Cath Removal   ESR.  The various methods of treatment have been discussed with the patient and family. After consideration of risks, benefits and other options for treatment, the patient has consented to  Procedure(s): DIALYSIS/PERMA CATHETER REMOVAL (N/A) as a surgical intervention.  The patient's history has been reviewed, patient examined, no change in status, stable for surgery.  I have reviewed the patient's chart and labs.  Questions were answered to the patient's satisfaction.     [name]

## 2024-06-25 NOTE — Telephone Encounter (Signed)
 Patient returned my call this morning and has been scheduled for a permcath removal for today with a 2:45 pm arrival time to the [facility]. Pre-procedure instructions were discussed and patient understood. This will be with Dr. Marea and patient does not have to be NPO.

## 2024-06-25 NOTE — Op Note (Signed)
 Operative Note     Preoperative diagnosis:   1. ESRD with functional permanent access  Postoperative diagnosis:  1. ESRD with functional permanent access  Procedure:  Removal of right jugular Permcath  Surgeon:  Selinda Gu, MD  Anesthesia:  Local  EBL:  Minimal  Indication for the Procedure:  The patient has a functional permanent dialysis access and no longer needs their permcath.  This can be removed.  Risks and benefits are discussed and informed consent is obtained.  Description of the Procedure:  The patient's right neck, chest and existing catheter were sterilely prepped and draped. The area around the catheter was anesthetized copiously with 1% lidocaine . The catheter was dissected out with curved hemostats until the cuff was freed from the surrounding fibrous sheath. The fiber sheath was transected, and the catheter was then removed in its entirety using gentle traction. Pressure was held and sterile dressings were placed. The patient tolerated the procedure well and was taken to the recovery room in stable condition.     Selinda Gu  06/25/2024, 3:37 PM This note was created with Dragon Medical transcription system. Any errors in dictation are purely unintentional.

## 2024-06-26 ENCOUNTER — Encounter: Payer: Self-pay | Admitting: Vascular Surgery

## 2024-06-26 DIAGNOSIS — E119 Type 2 diabetes mellitus without complications: Secondary | ICD-10-CM | POA: Diagnosis not present

## 2024-06-26 DIAGNOSIS — Z992 Dependence on renal dialysis: Secondary | ICD-10-CM | POA: Diagnosis not present

## 2024-06-26 DIAGNOSIS — N186 End stage renal disease: Secondary | ICD-10-CM | POA: Diagnosis not present

## 2024-06-27 ENCOUNTER — Telehealth (INDEPENDENT_AMBULATORY_CARE_PROVIDER_SITE_OTHER): Payer: Self-pay | Admitting: Vascular Surgery

## 2024-06-27 NOTE — Telephone Encounter (Signed)
 Can we call the patient back to get a little more information on this left arm pain and numbness.  Such as when it started, does the pain and numbness come and go? Where in his arm is it? Is it worse on dialysis or no?

## 2024-06-27 NOTE — Telephone Encounter (Signed)
 Left a detailed message on the patient voicemail from note below.

## 2024-06-27 NOTE — Telephone Encounter (Signed)
 Patient stated that pain started on Saturday after the b/p cuff being on during dialysis section.  The pain is constant from the forearm down but this morning it has subsided. The patient has used a heating pad and it has help some. The patient is experiencing stinging between the thumb and pointing finger.

## 2024-06-27 NOTE — Telephone Encounter (Signed)
 I think it is probably some nerve irritation from the BP cuff.  We can bring him for an arterial duplex to see GS

## 2024-06-27 NOTE — Telephone Encounter (Signed)
 Pt LVM on [facility] stating he thinks he needs his LEFT arm scanned. He is experiencing pain and numbness on that side now. His right arm has active loop wrap?SABRA He just had a procedure on 06/25/24. Please advise what should be scheduled for patient.

## 2024-06-28 DIAGNOSIS — Z992 Dependence on renal dialysis: Secondary | ICD-10-CM | POA: Diagnosis not present

## 2024-06-29 DIAGNOSIS — N186 End stage renal disease: Secondary | ICD-10-CM | POA: Diagnosis not present

## 2024-06-29 DIAGNOSIS — Z992 Dependence on renal dialysis: Secondary | ICD-10-CM | POA: Diagnosis not present

## 2024-06-30 DIAGNOSIS — N186 End stage renal disease: Secondary | ICD-10-CM | POA: Diagnosis not present

## 2024-06-30 DIAGNOSIS — Z992 Dependence on renal dialysis: Secondary | ICD-10-CM | POA: Diagnosis not present

## 2024-07-09 ENCOUNTER — Other Ambulatory Visit (INDEPENDENT_AMBULATORY_CARE_PROVIDER_SITE_OTHER): Payer: Self-pay | Admitting: Vascular Surgery

## 2024-07-09 DIAGNOSIS — N186 End stage renal disease: Secondary | ICD-10-CM | POA: Diagnosis not present

## 2024-07-09 DIAGNOSIS — E1151 Type 2 diabetes mellitus with diabetic peripheral angiopathy without gangrene: Secondary | ICD-10-CM | POA: Diagnosis not present

## 2024-07-09 DIAGNOSIS — Z1331 Encounter for screening for depression: Secondary | ICD-10-CM | POA: Diagnosis not present

## 2024-07-09 DIAGNOSIS — E113593 Type 2 diabetes mellitus with proliferative diabetic retinopathy without macular edema, bilateral: Secondary | ICD-10-CM | POA: Diagnosis not present

## 2024-07-09 DIAGNOSIS — E221 Hyperprolactinemia: Secondary | ICD-10-CM | POA: Diagnosis not present

## 2024-07-09 DIAGNOSIS — Z992 Dependence on renal dialysis: Secondary | ICD-10-CM | POA: Diagnosis not present

## 2024-07-09 DIAGNOSIS — E1165 Type 2 diabetes mellitus with hyperglycemia: Secondary | ICD-10-CM | POA: Diagnosis not present

## 2024-07-09 DIAGNOSIS — Z794 Long term (current) use of insulin: Secondary | ICD-10-CM | POA: Diagnosis not present

## 2024-07-09 DIAGNOSIS — E785 Hyperlipidemia, unspecified: Secondary | ICD-10-CM | POA: Diagnosis not present

## 2024-07-09 DIAGNOSIS — I120 Hypertensive chronic kidney disease with stage 5 chronic kidney disease or end stage renal disease: Secondary | ICD-10-CM | POA: Diagnosis not present

## 2024-07-12 DIAGNOSIS — Z992 Dependence on renal dialysis: Secondary | ICD-10-CM | POA: Diagnosis not present

## 2024-07-12 DIAGNOSIS — N186 End stage renal disease: Secondary | ICD-10-CM | POA: Diagnosis not present

## 2024-07-13 DIAGNOSIS — Z992 Dependence on renal dialysis: Secondary | ICD-10-CM | POA: Diagnosis not present

## 2024-07-16 DIAGNOSIS — E1169 Type 2 diabetes mellitus with other specified complication: Secondary | ICD-10-CM | POA: Diagnosis not present

## 2024-07-16 DIAGNOSIS — E1159 Type 2 diabetes mellitus with other circulatory complications: Secondary | ICD-10-CM | POA: Diagnosis not present

## 2024-07-16 DIAGNOSIS — Z992 Dependence on renal dialysis: Secondary | ICD-10-CM | POA: Diagnosis not present

## 2024-07-16 DIAGNOSIS — E113599 Type 2 diabetes mellitus with proliferative diabetic retinopathy without macular edema, unspecified eye: Secondary | ICD-10-CM | POA: Diagnosis not present

## 2024-07-16 DIAGNOSIS — E1122 Type 2 diabetes mellitus with diabetic chronic kidney disease: Secondary | ICD-10-CM | POA: Diagnosis not present

## 2024-07-16 DIAGNOSIS — E785 Hyperlipidemia, unspecified: Secondary | ICD-10-CM | POA: Diagnosis not present

## 2024-07-16 DIAGNOSIS — E221 Hyperprolactinemia: Secondary | ICD-10-CM | POA: Diagnosis not present

## 2024-07-16 DIAGNOSIS — N186 End stage renal disease: Secondary | ICD-10-CM | POA: Diagnosis not present

## 2024-07-16 DIAGNOSIS — Z794 Long term (current) use of insulin: Secondary | ICD-10-CM | POA: Diagnosis not present

## 2024-07-17 DIAGNOSIS — Z992 Dependence on renal dialysis: Secondary | ICD-10-CM | POA: Diagnosis not present

## 2024-07-19 ENCOUNTER — Other Ambulatory Visit (INDEPENDENT_AMBULATORY_CARE_PROVIDER_SITE_OTHER): Payer: Self-pay | Admitting: Nurse Practitioner

## 2024-07-19 DIAGNOSIS — Z992 Dependence on renal dialysis: Secondary | ICD-10-CM | POA: Diagnosis not present

## 2024-07-19 DIAGNOSIS — M79602 Pain in left arm: Secondary | ICD-10-CM

## 2024-07-19 DIAGNOSIS — N186 End stage renal disease: Secondary | ICD-10-CM | POA: Diagnosis not present

## 2024-07-21 DIAGNOSIS — N186 End stage renal disease: Secondary | ICD-10-CM | POA: Diagnosis not present

## 2024-07-23 ENCOUNTER — Encounter (INDEPENDENT_AMBULATORY_CARE_PROVIDER_SITE_OTHER): Payer: Self-pay | Admitting: Nurse Practitioner

## 2024-07-23 ENCOUNTER — Ambulatory Visit (INDEPENDENT_AMBULATORY_CARE_PROVIDER_SITE_OTHER)

## 2024-07-23 ENCOUNTER — Ambulatory Visit (INDEPENDENT_AMBULATORY_CARE_PROVIDER_SITE_OTHER): Admitting: Nurse Practitioner

## 2024-07-23 VITALS — BP 141/84 | HR 76 | Resp 17 | Ht 76.0 in | Wt 397.2 lb

## 2024-07-23 DIAGNOSIS — N186 End stage renal disease: Secondary | ICD-10-CM

## 2024-07-23 DIAGNOSIS — M79601 Pain in right arm: Secondary | ICD-10-CM

## 2024-07-23 DIAGNOSIS — I10 Essential (primary) hypertension: Secondary | ICD-10-CM | POA: Diagnosis not present

## 2024-07-23 DIAGNOSIS — M79602 Pain in left arm: Secondary | ICD-10-CM

## 2024-07-24 DIAGNOSIS — Z992 Dependence on renal dialysis: Secondary | ICD-10-CM | POA: Diagnosis not present

## 2024-07-25 DIAGNOSIS — N186 End stage renal disease: Secondary | ICD-10-CM | POA: Diagnosis not present

## 2024-07-25 DIAGNOSIS — Z992 Dependence on renal dialysis: Secondary | ICD-10-CM | POA: Diagnosis not present

## 2024-07-29 ENCOUNTER — Encounter (INDEPENDENT_AMBULATORY_CARE_PROVIDER_SITE_OTHER): Payer: Self-pay | Admitting: Nurse Practitioner

## 2024-07-29 DIAGNOSIS — Z992 Dependence on renal dialysis: Secondary | ICD-10-CM | POA: Diagnosis not present

## 2024-07-29 DIAGNOSIS — N186 End stage renal disease: Secondary | ICD-10-CM | POA: Diagnosis not present

## 2024-07-29 NOTE — Progress Notes (Signed)
 Subjective:    Patient ID: [name], male    DOB: September 22, 1970, 53 y.o.   MRN: [id number] Chief Complaint  Patient presents with   Follow-up    arterial duplex    HPI  Discussed the use of AI scribe software for clinical note transcription with the patient, who gave verbal consent to proceed.  History of Present Illness [name] is a 53 year old male who presents with arm numbness and pain following blood pressure measurements during dialysis.  He experiences arm numbness and pain after blood pressure measurements during dialysis, severe enough to prevent him from raising his left arm. He uses a topical menthol cream and a heating pad to alleviate the symptoms, which eventually allows him to regain arm function.  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 He describes a stinging sensation between two fingers.  He has a history of dialysis and notes that blood pressure cuffs are sometimes placed on different parts of his body, including his arm and thigh. He has experienced issues with the fit of the blood pressure cuffs, leading to discomfort and blistering, particularly due to lymphedema.  He notes that since the initial incident the pain has been improving with the topical treatments noted above.    Results DIAGNOSTIC Arterial duplex: Normal arterial blood flow; no thrombus detected (07/23/2024)   Review of Systems  Cardiovascular:  Positive for leg swelling.  All other systems reviewed and are negative.      Objective:   Physical Exam Vitals reviewed.  HENT:     Head: Normocephalic.  Cardiovascular:     Rate and Rhythm: Normal rate.     Pulses:          Radial pulses are 1+ on the right side and 1+ on the left side.  Pulmonary:     Effort: Pulmonary effort is normal.  Skin:    General: Skin is warm and dry.  Neurological:     Mental  Status: He is alert and oriented to person, place, and time.  Psychiatric:        Mood and Affect: Mood normal.        Behavior: Behavior normal.        Thought Content: Thought content normal.        Judgment: Judgment normal.     Physical Exam    BP (!) 141/84   Pulse 76   Resp 17   Ht 6' 4 (1.93 m)   Wt (!) 397 lb 3.2 oz (180.2 kg)   BMI 48.35 kg/m   Past Medical History:  Diagnosis Date   Anemia    Chronic kidney disease    14% FUNCTION   Diabetes mellitus without complication (HCC)    Diabetic retinopathy (HCC)    Dyspnea    DOE   Dysrhythmia    End stage renal disease (HCC)    History of orthopnea    error wrong patietn   Hypercholesteremia    error   Hyperkalemia    error wrong patient   Hypertension    Long-term insulin  use (HCC)    Lymphedema of leg    Metabolic encephalopathy    error   Microalbuminuria    MSSA (methicillin susceptible Staphylococcus aureus)    Obesity    PVD (peripheral vascular disease)    Respiratory failure (HCC)    Error  Sepsis (HCC)    error   Sleep apnea    no CPAP   UTI (urinary tract infection)    error   Venous ulcer (HCC)    Vitamin D deficiency     Social History   Socioeconomic History   Marital status: Divorced    Spouse name: Not on file   Number of children: 2   Years of education: Not on file   Highest education level: Not on file  Occupational History   Not on file  Tobacco Use   Smoking status: Former    Types: Cigars    Passive exposure: Past   Smokeless tobacco: Former    Quit date: 09/30/2017   Tobacco comments:    occasional  Vaping Use   Vaping status: Never Used  Substance and Sexual Activity   Alcohol  use: Yes    Alcohol /week: 1.0 standard drink of alcohol     Types: 1 Cans of beer per week    Comment: beer   Drug use: Not Currently   Sexual activity: Yes  Other Topics Concern   Not on file  Social History Narrative   Lives alone.  Significant other, Debbie. 2 grown kids     Social Drivers of Health   Financial Resource Strain: Low Risk  (07/16/2024)   Received [facility]   Overall Financial Resource Strain (CARDIA)    Difficulty of Paying Living Expenses: Not hard at all  Food Insecurity: No Food Insecurity (07/16/2024)   Received from [facility]   Hunger Vital Sign    Within the past 12 months, you worried that your food would run out before you got the money to buy more.: Never true    Within the past 12 months, the food you bought just didn't last and you didn't have money to get more.: Never true  Transportation Needs: No Transportation Needs (07/16/2024)   Received from [facility] - Transportation    In the past 12 months, has lack of transportation kept you from medical appointments or from getting medications?: No    Lack of Transportation (Non-Medical): No  Physical Activity: Not on file  Stress: Not on file  Social Connections: Socially Integrated (02/08/2024)   Social Connection and Isolation Panel    Frequency of Communication with Friends and Family: Three times a week    Frequency of Social Gatherings with Friends and Family: Three times a week    Attends Religious Services: 1 to 4 times per year    Active Member of Clubs or Organizations: No    Attends Banker Meetings: 1 to 4 times per year    Marital Status: Living with partner  Intimate Partner Violence: Not At Risk (02/08/2024)   Humiliation, Afraid, Rape, and Kick questionnaire    Fear of Current or Ex-Partner: No    Emotionally Abused: No    Physically Abused: No    Sexually Abused: No    Past Surgical History:  Procedure Laterality Date   A/V FISTULAGRAM Right 10/24/2018   Procedure: A/V FISTULAGRAM;  Surgeon: Jama Cordella MATSU, MD;  Location: [facility] INVASIVE CV LAB;  Service: Cardiovascular;  Laterality: Right;   A/V FISTULAGRAM Right 11/24/2018   Procedure: A/V FISTULAGRAM;  Surgeon: Jama Cordella MATSU, MD;  Location: [facility] INVASIVE CV [facility];  Service: Cardiovascular;  Laterality: Right;   A/V FISTULAGRAM Left 07/31/2019   Procedure: A/V FISTULAGRAM;  Surgeon: Jama Cordella MATSU, MD;  Location: [facility] INVASIVE  CV LAB;  Service: Cardiovascular;  Laterality: Left;   A/V FISTULAGRAM Left 12/11/2019   Procedure: A/V FISTULAGRAM;  Surgeon: Jama Cordella MATSU, MD;  Location: [facility] INVASIVE CV LAB;  Service: Cardiovascular;  Laterality: Left;   A/V FISTULAGRAM Left 07/08/2023   Procedure: A/V Fistulagram;  Surgeon: Jama Cordella MATSU, MD;  Location: [facility] INVASIVE CV [facility];  Service: Cardiovascular;  Laterality: Left;   A/V FISTULAGRAM Left 09/16/2023   Procedure: A/V Fistulagram;  Surgeon: Jama Cordella MATSU, MD;  Location: [facility] INVAS[facility] CV [facility];  Service: Cardiovascular;  Laterality: Left;   A/V FISTULAGRAM Left 12/15/2023   Procedure: A/V Fistulagram;  Surgeon: Marea Selinda RAMAN, MD;  Location: [facility] INVASIVE CV [facility];  Service: Cardiovascular;  Laterality: Left;   A/V FISTULAGRAM Right 04/17/2024   Procedure: A/V Fistulagram;  Surgeon: Jama Cordella MATSU, MD;  Location: [facility] INV[facility] CV [facility];  Service: Cardiovascular;  Laterality: Right;   A/V SHUNT INTERVENTION Right 02/21/2019   Procedure: A/V SHUNT INTERVENTION;  Surgeon: Jama Cordella MATSU, MD;  Location: [facility];  Service: Cardiovascular;  Laterality: Right;   APPLICATION OF WOUND VAC Left 10/03/2017   Procedure: APPLICATION OF WOUND VAC;  Surgeon: Marea Selinda RAMAN, MD;  Location: [facility] ORS;  Service: General;  Laterality: Left;   APPLICATION OF WOUND VAC Left 10/11/2017   Procedure: APPLICATION OF WOUND VAC;  Surgeon: Jama Cordella MATSU, MD;  Location: [facility] OR[facility];  Service: Vascular;  Laterality: Left;   APPLICATION OF WOUND VAC Left 10/14/2017   Procedure: WOUND VAC CHANGE;  Surgeon: Jama Cordella MATSU, MD;  Location: [facility] OR[facility];  Service: Vascular;  Laterality: Left;  left lower leg   APPLICATION OF WOUND VAC Left 10/18/2017   Procedure: WOUND VAC  CHANGE;  Surgeon: Jama Cordella MATSU, MD;  Location: [facility];  Service: Vascular;  Laterality: Left;   APPLICATION OF WOUND VAC Left 10/07/2017   Procedure: APPLICATION OF WOUND VAC;  Surgeon: Jama Cordella MATSU, MD;  Location: [facility];  Service: Vascular;  Laterality: Left;   APPLICATION OF WOUND VAC Left 11/17/2022   Procedure: Wound vac exchange;  Surgeon: Jama Cordella MATSU, MD;  Location: [facility];  Service: Vascular;  Laterality: Left;   APPLICATION OF WOUND VAC Left 11/10/2022   Procedure: APPLICATION OF WOUND VAC;  Surgeon: Jama Cordella MATSU, MD;  Location: [facility];  Service: Vascular;  Laterality: Left;   APPLICATION OF WOUND VAC Left 11/24/2022   Procedure: WOUND VAC REMOVAL;  Surgeon: Jama Cordella MATSU, MD;  Location: [facility];  Service: Vascular;  Laterality: Left;   APPLICATION OF WOUND VAC Left 11/12/2022   Procedure: APPLICATION OF WOUND VAC;  Surgeon: Jama Cordella MATSU, MD;  Location: [facility];  Service: Vascular;  Laterality: Left;   APPLICATION OF WOUND VAC Left 01/06/2023   Procedure: APPLICATION OF WOUND VAC;  Surgeon: Jama Cordella MATSU, MD;  Location: [facility];  Service: Vascular;  Laterality: Left;   AV FISTULA INSERTION W/ RF MAGNETIC GUIDANCE Right 07/19/2018   Procedure: AV FISTULA INSERTION W/RF MAGNETIC GUIDANCE;  Surgeon: Jama Cordella MATSU, MD;  Location: [facility];  Service: Cardiovascular;  Laterality: Right;   AV FISTULA PLACEMENT Left 05/11/2019   Procedure: ARTERIOVENOUS (AV) FISTULA CREATION (RADIOCEPHALIC );  Surgeon: Jama Cordella MATSU, MD;  Location: [facility];  Service: Vascular;  Laterality: Left;   AV FISTULA PLACEMENT Left 02/20/2020   Procedure: INSERTION OF ARTERIOVENOUS (AV) GORE-TEX GRAFT LEFT ARM ( FOREARM LOOP );  Surgeon: Jama Cordella MATSU, MD;  Location: [facility];  Service: Vascular;  Laterality: Left;   CATARACT EXTRACTION W/PHACO Left 11/01/2018   Procedure: CATARACT EXTRACTION PHACO AND INTRAOCULAR LENS PLACEMENT (IOC)-LEFT,  DIABETIC-INSULIN  DEPENDENT;  Surgeon: Myrna Adine Anes, MD;  Location: [facility];  Service: Ophthalmology;  Laterality: Left;  US  00:41.8 CDE 4.61 Fluid Pack Lot # [id number]   CATARACT EXTRACTION W/PHACO Right 04/27/2019   Procedure: CATARACT EXTRACTION PHACO AND INTRAOCULAR LENS PLACEMENT (IOC);  Surgeon: Myrna Adine Anes, MD;  Location: [facility];  Service: Ophthalmology;  Laterality: Right;  US  00:34 CDE 1.97 FLUID PACK LOT # [id number]    COLONOSCOPY WITH PROPOFOL  N/A 05/12/2020   Procedure: COLONOSCOPY WITH PROPOFOL ;  Surgeon: Toledo, Ladell POUR, MD;  Location: [facility];  Service: Gastroenterology;  Laterality: N/A;   DIALYSIS/PERMA CATHETER INSERTION N/A 02/23/2019   Procedure: DIALYSIS/PERMA CATHETER INSERTION;  Surgeon: Marea Selinda RAMAN, MD;  Location: [facility];  Service: Cardiovascular;  Laterality: N/A;   DIALYSIS/PERMA CATHETER INSERTION Right 12/15/2023   Procedure: DIALYSIS/PERMA CATHETER INSERTION;  Surgeon: Marea Selinda RAMAN, MD;  Location: [facility];  Service: Cardiovascular;  Laterality: Right;   DIALYSIS/PERMA CATHETER REMOVAL N/A 04/03/2020   Procedure: DIALYSIS/PERMA CATHETER REMOVAL;  Surgeon: Jama Cordella MATSU, MD;  Location: [facility];  Service: Cardiovascular;  Laterality: N/A;   DIALYSIS/PERMA CATHETER REMOVAL N/A 02/03/2022   Procedure: DIALYSIS/PERMA CATHETER REMOVAL;  Surgeon: Jama Cordella MATSU, MD;  Location: [facility];  Service: Cardiovascular;  Laterality: N/A;   DIALYSIS/PERMA CATHETER REMOVAL N/A 06/25/2024   Procedure: DIALYSIS/PERMA CATHETER REMOVAL;  Surgeon: Marea Selinda RAMAN, MD;  Location: [facility];  Service: Cardiovascular;  Laterality: N/A;   I & D EXTREMITY Left 10/11/2017   Procedure: IRRIGATION AND DEBRIDEMENT EXTREMITY;  Surgeon: Jama Cordella MATSU, MD;  Location: [facility];  Service: Vascular;  Laterality: Left;   INCISION AND DRAINAGE ABSCESS Right 10/07/2017   Procedure: INCISION AND DRAINAGE ABSCESS;   Surgeon: Jama Cordella MATSU, MD;  Location: [facility];  Service: Vascular;  Laterality: Right;   INCISION AND DRAINAGE OF WOUND Left 11/17/2022   Procedure: DEBRIDEMENT WOUND;  Surgeon: Jama Cordella MATSU, MD;  Location: [facility];  Service: Vascular;  Laterality: Left;   INCISION AND DRAINAGE OF WOUND Left 11/24/2022   Procedure: DEBRIDEMENT WOUND LEFT LOWER EXTREMITY;  Surgeon: Jama Cordella MATSU, MD;  Location: [facility];  Service: Vascular;  Laterality: Left;   INSERTION OF ARTERIOVENOUS (AV) ARTEGRAFT ARM Right 03/14/2024   Procedure: INSERTION, GRAFT, ARTERIOVENOUS, UPPER EXTREMITY;  Surgeon: Jama Cordella MATSU, MD;  Location: [facility];  Service: Vascular;  Laterality: Right;   IRRIGATION AND DEBRIDEMENT ABSCESS Left 10/03/2017   Procedure: IRRIGATION AND DEBRIDEMENT ABSCESS with debridement of skin, soft tissue, muscle 50sq cm;  Surgeon: Marea Selinda RAMAN, MD;  Location: [facility];  Service: General;  Laterality: Left;   LOWER EXTREMITY ANGIOGRAPHY Left 10/15/2022   Procedure: Lower Extremity Angiography;  Surgeon: Jama Cordella MATSU, MD;  Location: [facility];  Service: Cardiovascular;  Laterality: Left;   TEE WITHOUT CARDIOVERSION N/A 03/29/2023   Procedure: TRANSESOPHAGEAL ECHOCARDIOGRAM;  Surgeon: Perla Evalene PARAS, MD;  Location: [facility];  Service: Cardiovascular;  Laterality: N/A;   TEMPORARY DIALYSIS CATHETER  02/20/2019   Procedure: TEMPORARY DIALYSIS CATHETER;  Surgeon: Jama Cordella MATSU, MD;  Location: [facility];  Service: Cardiovascular;;   TEMPORARY DIALYSIS CATHETER N/A 12/13/2023   Procedure: TEMPORARY DIALYSIS CATHETER;  Surgeon: Jama Cordella MATSU, MD;  Location: [facility];  Service: Cardiovascular;  Laterality: N/A;   UPPER EXTREMITY  ANGIOGRAPHY Left 09/18/2019   Procedure: UPPER EXTREMITY ANGIOGRAPHY;  Surgeon: Jama Cordella MATSU, MD;  Location: [facility];  Service: Cardiovascular;  Laterality: Left;   WOUND DEBRIDEMENT Left 11/10/2022    Procedure: DEBRIDEMENT WOUND;  Surgeon: Jama Cordella MATSU, MD;  Location: [facility];  Service: Vascular;  Laterality: Left;   WOUND DEBRIDEMENT Left 11/12/2022   Procedure: DEBRIDEMENT WOUND;  Surgeon: Jama Cordella MATSU, MD;  Location: [facility];  Service: Vascular;  Laterality: Left;   WOUND DEBRIDEMENT Left 01/06/2023   Procedure: DEBRIDEMENT WOUND;  Surgeon: Jama Cordella MATSU, MD;  Location: [facility];  Service: Vascular;  Laterality: Left;    Family History  Problem Relation Age of Onset   Diabetes Father    Kidney disease Father    Diabetes Daughter     Allergies  Allergen Reactions   Lisinopril Swelling    Lips mouth    Furosemide  Cough    Light headed, blurry vision, weakness.   Latex Itching   Silicone Itching   Adhesive [Tape] Itching and Rash       Latest Ref Rng & Units 03/14/2024   11:25 AM 02/15/2024    8:34 AM 12/16/2023    4:22 AM  CBC  WBC 4.0 - 10.5 K/uL  6.1  5.8   Hemoglobin 13.0 - 17.0 g/dL 83.2  86.3  86.6   Hematocrit 39.0 - 52.0 % 49.0  41.1  39.7   Platelets 150 - 400 K/uL  168  102       CMP     Component Value Date/Time   NA 139 03/14/2024 1125   K 5.2 (H) 03/14/2024 1125   CL 103 03/14/2024 1125   CO2 24 02/15/2024 0834   GLUCOSE 89 03/14/2024 1125   BUN 50 (H) 03/14/2024 1125   CREATININE 14.60 (H) 03/14/2024 1125   CALCIUM  9.1 02/15/2024 0834   PROT 7.5 12/12/2023 1518   ALBUMIN 3.9 12/12/2023 1518   AST 9 (L) 12/12/2023 1518   ALT 13 12/12/2023 1518   ALKPHOS 51 12/12/2023 1518   BILITOT 1.0 12/12/2023 1518   GFRNONAA 4 (L) 02/15/2024 0834     No results found.     Assessment & Plan:   1. Pain of right upper extremity (Primary) Right upper extremity tendinitis versus carpal tunnel syndrome Intermittent numbness and pain likely due to repetitive motion and possible nerve irritation. Differential includes tendinitis and carpal tunnel syndrome. Arterial duplex showed good blood flow, suggesting nerve-related issue. - Continue  topical menthol cream and heat application as needed. - Consider referral to orthopedic surgery if symptoms worsen or become more frequent.  2. End stage renal disease (HCC) End stage renal disease on dialysis Ongoing dialysis with issues related to blood pressure cuff size and placement causing discomfort and potential nerve irritation. Dialysis access functioning well with good blood flow. - Ensure appropriate blood pressure cuff size during dialysis to prevent nerve irritation. - Continue monitoring dialysis access function.  3. Primary hypertension Continue antihypertensive medications as already ordered, these medications have been reviewed and there are no changes at this time.     Current Outpatient Medications on File Prior to Visit  Medication Sig Dispense Refill   acetaminophen  (TYLENOL ) 325 MG tablet Take 2 tablets (650 mg total) by mouth every 6 (six) hours as needed for mild pain (or Fever >/= 101).     allopurinol  (ZYLOPRIM ) 100 MG tablet Take 100 mg by mouth daily.     atorvastatin  (LIPITOR) 80 MG tablet Take 80  mg by mouth at bedtime.     B Complex-C-Folic Acid  (RENA-VITE RX) 1 MG TABS Take 1 tablet by mouth 3 (three) times a week.      cabergoline (DOSTINEX) 0.5 MG tablet Take 0.5 mg by mouth.     calcium  acetate (PHOSLO ) 667 MG capsule Take 1,334-2,001 mg by mouth See admin instructions. Take 2001 mg by mouth with each meal & take 1334 mg by mouth with each snack.     cinacalcet  (SENSIPAR ) 30 MG tablet Take 60 mg by mouth daily.     CINNAMON  PO Take 500 mg by mouth 2 (two) times daily.      clopidogrel  (PLAVIX ) 75 MG tablet Take 1 tablet (75 mg total) by mouth daily. 30 tablet 0   Continuous Glucose Sensor (DEXCOM G7 SENSOR) MISC USE 1 EACH EVERY 10 (TEN) DAYS     ELIQUIS  5 MG TABS tablet TAKE 1 TABLET BY MOUTH 2 TIMES DAILY. PLEASE RESTART YOUR ELIQUIS  WITH TOMORROW MORNING'S DOSE 60 tablet 0   Insulin  Aspart FlexPen (NOVOLOG ) 100 UNIT/ML Inject 5 Units subcutaneously 3  (three) times daily with meals SKIP dose if your sugar before your meal is your sugar is below 100.     insulin  degludec (TRESIBA) 100 UNIT/ML FlexTouch Pen Inject 30 Units subcutaneously once daily     lidocaine -prilocaine  (EMLA ) cream Apply 1 application topically as needed (port access).     Multiple Vitamins-Minerals (EYE HEALTH PO) Take 1 capsule by mouth daily.      OZEMPIC, 0.25 OR 0.5 MG/DOSE, 2 MG/3ML SOPN 0.25 mg once a week.     Polyethyl Glycol-Propyl Glycol (SYSTANE OP) Place 1 drop into both eyes daily as needed (dry eyes).     sildenafil  (REVATIO ) 20 MG tablet TAKE 1 TABLET DAILY. DO NOT TAKE WITH NITRATES. 30 tablet 0   ACCU-CHEK GUIDE test strip USE 1 STRIP TOTAL 2 TIMES DAILY FOR 90 DAYS (Patient not taking: Reported on 07/23/2024)     amLODipine  (NORVASC ) 10 MG tablet Take 5 mg by mouth daily as needed (if bp is 190/90 or higher).  (Patient not taking: Reported on 07/23/2024)     HYDROcodone -acetaminophen  (NORCO/VICODIN) 5-325 MG tablet Take 1-2 tablets by mouth every 6 (six) hours as needed for moderate pain (pain score 4-6) or severe pain (pain score 7-10). (Patient not taking: Reported on 07/23/2024) 36 tablet 0   Current Facility-Administered Medications on File Prior to Visit  Medication Dose Route Frequency Provider Last Rate Last Admin   lidocaine  (PF) (XYLOCAINE ) 1 % injection    PRN Schnier, Cordella MATSU, MD   2 mL at 04/17/24 1515    There are no Patient Instructions on file for this visit. No follow-ups on file.   Ladajah Soltys E Ayano Douthitt, NP

## 2024-08-06 ENCOUNTER — Other Ambulatory Visit (INDEPENDENT_AMBULATORY_CARE_PROVIDER_SITE_OTHER): Payer: Self-pay | Admitting: Vascular Surgery

## 2024-08-27 ENCOUNTER — Encounter (INDEPENDENT_AMBULATORY_CARE_PROVIDER_SITE_OTHER): Payer: Self-pay | Admitting: Vascular Surgery

## 2024-08-27 ENCOUNTER — Ambulatory Visit (INDEPENDENT_AMBULATORY_CARE_PROVIDER_SITE_OTHER)

## 2024-08-27 ENCOUNTER — Ambulatory Visit (INDEPENDENT_AMBULATORY_CARE_PROVIDER_SITE_OTHER): Admitting: Vascular Surgery

## 2024-08-27 VITALS — BP 179/87 | HR 67 | Resp 17 | Ht 77.0 in | Wt >= 6400 oz

## 2024-08-27 DIAGNOSIS — I872 Venous insufficiency (chronic) (peripheral): Secondary | ICD-10-CM

## 2024-08-27 DIAGNOSIS — E785 Hyperlipidemia, unspecified: Secondary | ICD-10-CM

## 2024-08-27 DIAGNOSIS — N186 End stage renal disease: Secondary | ICD-10-CM | POA: Diagnosis not present

## 2024-08-27 DIAGNOSIS — E1165 Type 2 diabetes mellitus with hyperglycemia: Secondary | ICD-10-CM | POA: Diagnosis not present

## 2024-08-27 DIAGNOSIS — I10 Essential (primary) hypertension: Secondary | ICD-10-CM

## 2024-08-27 DIAGNOSIS — Z794 Long term (current) use of insulin: Secondary | ICD-10-CM | POA: Diagnosis not present

## 2024-08-27 NOTE — Progress Notes (Unsigned)
 "                     MRN : [id number]  [name] is a 53 y.o. (1971/03/06) male who presents with chief complaint of check access.  History of Present Illness: ***  Active Medications[1]  Past Medical History:  Diagnosis Date   Anemia    Chronic kidney disease    14% FUNCTION   Diabetes mellitus without complication (HCC)    Diabetic retinopathy (HCC)    Dyspnea    DOE   Dysrhythmia    End stage renal disease (HCC)    History of orthopnea    error wrong patietn   Hypercholesteremia    error   Hyperkalemia    error wrong patient   Hypertension    Long-term insulin  use (HCC)    Lymphedema of leg    Metabolic encephalopathy    error   Microalbuminuria    MSSA (methicillin susceptible Staphylococcus aureus)    Obesity    PVD (peripheral vascular disease)    Respiratory failure (HCC)    Error   Sepsis (HCC)    error   Sleep apnea    no CPAP   UTI (urinary tract infection)    error   Venous ulcer (HCC)    Vitamin D deficiency     Past Surgical History:  Procedure Laterality Date   A/V FISTULAGRAM Right 10/24/2018   Procedure: A/V FISTULAGRAM;  Surgeon: Jama Cordella MATSU, MD;  Location: [facility];  Service: Cardiovascular;  Laterality: Right;   A/V FISTULAGRAM Right 11/24/2018   Procedure: A/V FISTULAGRAM;  Surgeon: Jama Cordella MATSU, MD;  Location: [facility];  Service: Cardiovascular;  Laterality: Right;   A/V FISTULAGRAM Left 07/31/2019   Procedure: A/V FISTULAGRAM;  Surgeon: Jama Cordella MATSU, MD;  Location: [facility];  Service: Cardiovascular;  Laterality: Left;   A/V FISTULAGRAM Left 12/11/2019   Procedure: A/V FISTULAGRAM;  Surgeon: Jama Cordella MATSU, MD;  Location: [facility];  Service: Cardiovascular;  Laterality: Left;   A/V FISTULAGRAM Left 07/08/2023   Procedure: A/V Fistulagram;  Surgeon: Jama Cordella MATSU, MD;  Location: [facility];  Service: Cardiovascular;  Laterality: Left;   A/V  FISTULAGRAM Left 09/16/2023   Procedure: A/V Fistulagram;  Surgeon: Jama Cordella MATSU, MD;  Location: [facility];  Service: Cardiovascular;  Laterality: Left;   A/V FISTULAGRAM Left 12/15/2023   Procedure: A/V Fistulagram;  Surgeon: Marea Selinda RAMAN, MD;  Location: [facility];  Service: Cardiovascular;  Laterality: Left;   A/V FISTULAGRAM Right 04/17/2024   Procedure: A/V Fistulagram;  Surgeon: Jama Cordella MATSU, MD;  Location: [facility];  Service: Cardiovascular;  Laterality: Right;   A/V SHUNT INTERVENTION Right 02/21/2019   Procedure: A/V SHUNT INTERVENTION;  Surgeon: Jama Cordella MATSU, MD;  Location: [facility];  Service: Cardiovascular;  Laterality: Right;   APPLICATION OF WOUND VAC Left 10/03/2017   Procedure: APPLICATION OF WOUND VAC;  Surgeon: Marea Selinda RAMAN, MD;  Location: [facility];  Service: General;  Laterality: Left;   APPLICATION OF WOUND VAC Left 10/11/2017   Procedure: APPLICATION OF WOUND VAC;  Surgeon: Jama Cordella MATSU, MD;  Location: [facility];  Service: Vascular;  Laterality: Left;   APPLICATION OF WOUND VAC Left 10/14/2017   Procedure: WOUND VAC CHANGE;  Surgeon: Jama Cordella MATSU, MD;  Location: [facility];  Service: Vascular;  Laterality: Left;  left lower leg  APPLICATION OF WOUND VAC Left 10/18/2017   Procedure: WOUND VAC CHANGE;  Surgeon: Jama Cordella MATSU, MD;  Location: [facility];  Service: Vascular;  Laterality: Left;   APPLICATION OF WOUND VAC Left 10/07/2017   Procedure: APPLICATION OF WOUND VAC;  Surgeon: Jama Cordella MATSU, MD;  Location: [facility];  Service: Vascular;  Laterality: Left;   APPLICATION OF WOUND VAC Left 11/17/2022   Procedure: Wound vac exchange;  Surgeon: Jama Cordella MATSU, MD;  Location: [facility];  Service: Vascular;  Laterality: Left;   APPLICATION OF WOUND VAC Left 11/10/2022   Procedure: APPLICATION OF WOUND VAC;  Surgeon: Jama Cordella MATSU, MD;  Location: [facility];  Service: Vascular;  Laterality: Left;    APPLICATION OF WOUND VAC Left 11/24/2022   Procedure: WOUND VAC REMOVAL;  Surgeon: Jama Cordella MATSU, MD;  Location: [facility];  Service: Vascular;  Laterality: Left;   APPLICATION OF WOUND VAC Left 11/12/2022   Procedure: APPLICATION OF WOUND VAC;  Surgeon: Jama Cordella MATSU, MD;  Location: [facility];  Service: Vascular;  Laterality: Left;   APPLICATION OF WOUND VAC Left 01/06/2023   Procedure: APPLICATION OF WOUND VAC;  Surgeon: Jama Cordella MATSU, MD;  Location: [facility];  Service: Vascular;  Laterality: Left;   AV FISTULA INSERTION W/ RF MAGNETIC GUIDANCE Right 07/19/2018   Procedure: AV FISTULA INSERTION W/RF MAGNETIC GUIDANCE;  Surgeon: Jama Cordella MATSU, MD;  Location: [facility];  Service: Cardiovascular;  Laterality: Right;   AV FISTULA PLACEMENT Left 05/11/2019   Procedure: ARTERIOVENOUS (AV) FISTULA CREATION (RADIOCEPHALIC );  Surgeon: Jama Cordella MATSU, MD;  Location: [facility];  Service: Vascular;  Laterality: Left;   AV FISTULA PLACEMENT Left 02/20/2020   Procedure: INSERTION OF ARTERIOVENOUS (AV) GORE-TEX GRAFT LEFT ARM ( FOREARM LOOP );  Surgeon: Jama Cordella MATSU, MD;  Location: [facility];  Service: Vascular;  Laterality: Left;   CATARACT EXTRACTION W/PHACO Left 11/01/2018   Procedure: CATARACT EXTRACTION PHACO AND INTRAOCULAR LENS PLACEMENT (IOC)-LEFT, DIABETIC-INSULIN  DEPENDENT;  Surgeon: Myrna Adine Anes, MD;  Location: [facility];  Service: Ophthalmology;  Laterality: Left;  US  00:41.8 CDE 4.61 Fluid Pack Lot # [id number]   CATARACT EXTRACTION W/PHACO Right 04/27/2019   Procedure: CATARACT EXTRACTION PHACO AND INTRAOCULAR LENS PLACEMENT (IOC);  Surgeon: Myrna Adine Anes, MD;  Location: [facility];  Service: Ophthalmology;  Laterality: Right;  US  00:34 CDE 1.97 FLUID PACK LOT # [id number]    COLONOSCOPY WITH PROPOFOL  N/A 05/12/2020   Procedure: COLONOSCOPY WITH PROPOFOL ;  Surgeon: Toledo, Ladell POUR, MD;  Location: [facility];  Service: Gastroenterology;  Laterality: N/A;    DIALYSIS/PERMA CATHETER INSERTION N/A 02/23/2019   Procedure: DIALYSIS/PERMA CATHETER INSERTION;  Surgeon: Marea Selinda RAMAN, MD;  Location: [facility];  Service: Cardiovascular;  Laterality: N/A;   DIALYSIS/PERMA CATHETER INSERTION Right 12/15/2023   Procedure: DIALYSIS/PERMA CATHETER INSERTION;  Surgeon: Marea Selinda RAMAN, MD;  Location: [facility];  Service: Cardiovascular;  Laterality: Right;   DIALYSIS/PERMA CATHETER REMOVAL N/A 04/03/2020   Procedure: DIALYSIS/PERMA CATHETER REMOVAL;  Surgeon: Jama Cordella MATSU, MD;  Location: [facility];  Service: Cardiovascular;  Laterality: N/A;   DIALYSIS/PERMA CATHETER REMOVAL N/A 02/03/2022   Procedure: DIALYSIS/PERMA CATHETER REMOVAL;  Surgeon: Jama Cordella MATSU, MD;  Location: [facility];  Service: Cardiovascular;  Laterality: N/A;   DIALYSIS/PERMA CATHETER REMOVAL N/A 06/25/2024   Procedure: DIALYSIS/PERMA CATHETER REMOVAL;  Surgeon: Marea Selinda RAMAN, MD;  Location: [facility];  Service: Cardiovascular;  Laterality: N/A;   I &  D EXTREMITY Left 10/11/2017   Procedure: IRRIGATION AND DEBRIDEMENT EXTREMITY;  Surgeon: Jama Cordella MATSU, MD;  Location: [facility];  Service: Vascular;  Laterality: Left;   INCISION AND DRAINAGE ABSCESS Right 10/07/2017   Procedure: INCISION AND DRAINAGE ABSCESS;  Surgeon: Jama Cordella MATSU, MD;  Location: [facility];  Service: Vascular;  Laterality: Right;   INCISION AND DRAINAGE OF WOUND Left 11/17/2022   Procedure: DEBRIDEMENT WOUND;  Surgeon: Jama Cordella MATSU, MD;  Location: [facility];  Service: Vascular;  Laterality: Left;   INCISION AND DRAINAGE OF WOUND Left 11/24/2022   Procedure: DEBRIDEMENT WOUND LEFT LOWER EXTREMITY;  Surgeon: Jama Cordella MATSU, MD;  Location: [facility];  Service: Vascular;  Laterality: Left;   INSERTION OF ARTERIOVENOUS (AV) ARTEGRAFT ARM Right 03/14/2024   Procedure: INSERTION, GRAFT, ARTERIOVENOUS, UPPER EXTREMITY;  Surgeon: Jama Cordella MATSU, MD;  Location: [facility];   Service: Vascular;  Laterality: Right;   IRRIGATION AND DEBRIDEMENT ABSCESS Left 10/03/2017   Procedure: IRRIGATION AND DEBRIDEMENT ABSCESS with debridement of skin, soft tissue, muscle 50sq cm;  Surgeon: Marea Selinda RAMAN, MD;  Location: [facility];  Service: General;  Laterality: Left;   LOWER EXTREMITY ANGIOGRAPHY Left 10/15/2022   Procedure: Lower Extremity Angiography;  Surgeon: Jama Cordella MATSU, MD;  Location: [facility];  Service: Cardiovascular;  Laterality: Left;   TEE WITHOUT CARDIOVERSION N/A 03/29/2023   Procedure: TRANSESOPHAGEAL ECHOCARDIOGRAM;  Surgeon: Perla Evalene PARAS, MD;  Location: [facility];  Service: Cardiovascular;  Laterality: N/A;   TEMPORARY DIALYSIS CATHETER  02/20/2019   Procedure: TEMPORARY DIALYSIS CATHETER;  Surgeon: Jama Cordella MATSU, MD;  Location: [facility];  Service: Cardiovascular;;   TEMPORARY DIALYSIS CATHETER N/A 12/13/2023   Procedure: TEMPORARY DIALYSIS CATHETER;  Surgeon: Jama Cordella MATSU, MD;  Location: [facility];  Service: Cardiovascular;  Laterality: N/A;   UPPER EXTREMITY ANGIOGRAPHY Left 09/18/2019   Procedure: UPPER EXTREMITY ANGIOGRAPHY;  Surgeon: Jama Cordella MATSU, MD;  Location: [facility];  Service: Cardiovascular;  Laterality: Left;   WOUND DEBRIDEMENT Left 11/10/2022   Procedure: DEBRIDEMENT WOUND;  Surgeon: Jama Cordella MATSU, MD;  Location: [facility];  Service: Vascular;  Laterality: Left;   WOUND DEBRIDEMENT Left 11/12/2022   Procedure: DEBRIDEMENT WOUND;  Surgeon: Jama Cordella MATSU, MD;  Location: [facility];  Service: Vascular;  Laterality: Left;   WOUND DEBRIDEMENT Left 01/06/2023   Procedure: DEBRIDEMENT WOUND;  Surgeon: Jama Cordella MATSU, MD;  Location: [facility];  Service: Vascular;  Laterality: Left;    Social History Social History[2]  Family History Family History  Problem Relation Age of Onset   Diabetes Father    Kidney disease Father    Diabetes Daughter     Allergies[3]   REVIEW OF  SYSTEMS (Negative unless checked)  Constitutional: [] Weight loss  [] Fever  [] Chills Cardiac: [] Chest pain   [] Chest pressure   [] Palpitations   [] Shortness of breath when laying flat   [] Shortness of breath with exertion. Vascular:  [] Pain in legs with walking   [] Pain in legs at rest  [] History of DVT   [] Phlebitis   [] Swelling in legs   [] Varicose veins   [] Non-healing ulcers Pulmonary:   [] Uses home oxygen   [] Productive cough   [] Hemoptysis   [] Wheeze  [] COPD   [] Asthma Neurologic:  [] Dizziness   [] Seizures   [] History of stroke   [] History of TIA  [] Aphasia   [] Vissual changes   [] Weakness or numbness in arm   [] Weakness or numbness in leg Musculoskeletal:   []   Joint swelling   [] Joint pain   [] Low back pain Hematologic:  [] Easy bruising  [] Easy bleeding   [] Hypercoagulable state   [] Anemic Gastrointestinal:  [] Diarrhea   [] Vomiting  [] Gastroesophageal reflux/heartburn   [] Difficulty swallowing. Genitourinary:  [x] Chronic kidney disease   [] Difficult urination  [] Frequent urination   [] Blood in urine Skin:  [] Rashes   [] Ulcers  Psychological:  [] History of anxiety   []  History of major depression.  Physical Examination  There were no vitals filed for this visit. There is no height or weight on file to calculate BMI. Gen: WD/WN, NAD Head: [place]/AT, No temporalis wasting.  Ear/Nose/Throat: Hearing grossly intact, nares w/o erythema or drainage Eyes: PER, EOMI, sclera nonicteric.  Neck: Supple, no gross masses or lesions.  No JVD.  Pulmonary:  Good air movement, no audible wheezing, no use of accessory muscles.  Cardiac: RRR, precordium non-hyperdynamic. Vascular:   *** Vessel Right Left  Radial Palpable Palpable  Brachial Palpable Palpable  Gastrointestinal: soft, non-distended. No guarding/no peritoneal signs.  Musculoskeletal: M/S 5/5 throughout.  No deformity.  Neurologic: CN 2-12 intact. Pain and light touch intact in extremities.  Symmetrical.  Speech is fluent. Motor exam as  listed above. Psychiatric: Judgment intact, Mood & affect appropriate for pt's clinical situation. Dermatologic: No rashes or ulcers noted.  No changes consistent with cellulitis.   CBC Lab Results  Component Value Date   WBC 6.1 02/15/2024   HGB 16.7 03/14/2024   HCT 49.0 03/14/2024   MCV 100.0 02/15/2024   PLT 168 02/15/2024    BMET    Component Value Date/Time   NA 139 03/14/2024 1125   K 5.2 (H) 03/14/2024 1125   CL 103 03/14/2024 1125   CO2 24 02/15/2024 0834   GLUCOSE 89 03/14/2024 1125   BUN 50 (H) 03/14/2024 1125   CREATININE 14.60 (H) 03/14/2024 1125   CALCIUM  9.1 02/15/2024 0834   GFRNONAA 4 (L) 02/15/2024 0834   GFRAA 9 (L) 02/19/2020 1316   CrCl cannot be calculated (Patient's most recent lab result is older than the maximum 21 days allowed.).  COAG Lab Results  Component Value Date   INR 1.2 12/12/2023   INR 1.0 03/24/2023   INR 1.1 11/12/2022    Radiology No results found.   Assessment/Plan There are no diagnoses linked to this encounter.   Cordella Shawl, MD  08/27/2024 9:26 AM      [1]  No outpatient medications have been marked as taking for the 08/27/24 encounter (Appointment) with Shawl, Cordella MATSU, MD.  [2]  Social History Tobacco Use   Smoking status: Former    Types: Cigars    Passive exposure: Past   Smokeless tobacco: Former    Quit date: 09/30/2017   Tobacco comments:    occasional  Vaping Use   Vaping status: Never Used  Substance Use Topics   Alcohol  use: Yes    Alcohol /week: 1.0 standard drink of alcohol     Types: 1 Cans of beer per week    Comment: beer   Drug use: Not Currently  [3]  Allergies Allergen Reactions   Lisinopril Swelling    Lips mouth    Furosemide  Cough    Light headed, blurry vision, weakness.   Latex Itching   Silicone Itching   Adhesive [Tape] Itching and Rash   "

## 2024-08-29 ENCOUNTER — Encounter (INDEPENDENT_AMBULATORY_CARE_PROVIDER_SITE_OTHER): Payer: Self-pay | Admitting: Vascular Surgery

## 2024-09-05 ENCOUNTER — Other Ambulatory Visit (INDEPENDENT_AMBULATORY_CARE_PROVIDER_SITE_OTHER): Payer: Self-pay | Admitting: Nurse Practitioner

## 2024-09-27 ENCOUNTER — Other Ambulatory Visit (INDEPENDENT_AMBULATORY_CARE_PROVIDER_SITE_OTHER): Payer: Self-pay

## 2024-09-27 MED ORDER — CLOPIDOGREL BISULFATE 75 MG PO TABS: 75.0000 mg | ORAL_TABLET | Freq: Every day | ORAL | 11 refills | Status: AC

## 2024-11-26 ENCOUNTER — Encounter (INDEPENDENT_AMBULATORY_CARE_PROVIDER_SITE_OTHER)

## 2024-11-26 ENCOUNTER — Ambulatory Visit (INDEPENDENT_AMBULATORY_CARE_PROVIDER_SITE_OTHER): Admitting: Vascular Surgery
